# Patient Record
Sex: Male | Born: 1985 | Race: White | Hispanic: No | Marital: Married | Smoking: Former smoker
Health system: Southern US, Community
[De-identification: ages and names within clinical notes are randomized; demographics above are authoritative.]

## PROBLEM LIST (undated history)

## (undated) DIAGNOSIS — D649 Anemia, unspecified: Secondary | ICD-10-CM

## (undated) DIAGNOSIS — N3941 Urge incontinence: Secondary | ICD-10-CM

## (undated) DIAGNOSIS — N319 Neuromuscular dysfunction of bladder, unspecified: Secondary | ICD-10-CM

## (undated) DIAGNOSIS — N318 Other neuromuscular dysfunction of bladder: Secondary | ICD-10-CM

## (undated) DIAGNOSIS — G8220 Paraplegia, unspecified: Secondary | ICD-10-CM

## (undated) DIAGNOSIS — J302 Other seasonal allergic rhinitis: Secondary | ICD-10-CM

## (undated) DIAGNOSIS — Z789 Other specified health status: Secondary | ICD-10-CM

## (undated) DIAGNOSIS — S24103A Unspecified injury at T7-T10 level of thoracic spinal cord, initial encounter: Secondary | ICD-10-CM

## (undated) DIAGNOSIS — Z9989 Dependence on other enabling machines and devices: Secondary | ICD-10-CM

## (undated) DIAGNOSIS — Z86718 Personal history of other venous thrombosis and embolism: Secondary | ICD-10-CM

## (undated) DIAGNOSIS — Z993 Dependence on wheelchair: Secondary | ICD-10-CM

## (undated) DIAGNOSIS — G4733 Obstructive sleep apnea (adult) (pediatric): Secondary | ICD-10-CM

## (undated) DIAGNOSIS — K219 Gastro-esophageal reflux disease without esophagitis: Secondary | ICD-10-CM

## (undated) DIAGNOSIS — F32A Depression, unspecified: Secondary | ICD-10-CM

## (undated) DIAGNOSIS — I10 Essential (primary) hypertension: Secondary | ICD-10-CM

## (undated) DIAGNOSIS — L97929 Non-pressure chronic ulcer of unspecified part of left lower leg with unspecified severity: Secondary | ICD-10-CM

## (undated) HISTORY — PX: TONSILLECTOMY: SUR1361

## (undated) HISTORY — DX: Anemia, unspecified: D64.9

## (undated) HISTORY — DX: Essential (primary) hypertension: I10

## (undated) HISTORY — DX: Depression, unspecified: F32.A

---
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
## 2011-07-19 ENCOUNTER — Ambulatory Visit (HOSPITAL_BASED_OUTPATIENT_CLINIC_OR_DEPARTMENT_OTHER): Payer: Managed Care, Other (non HMO) | Attending: Family Medicine

## 2011-07-19 DIAGNOSIS — G4733 Obstructive sleep apnea (adult) (pediatric): Secondary | ICD-10-CM | POA: Insufficient documentation

## 2011-07-24 DIAGNOSIS — G4733 Obstructive sleep apnea (adult) (pediatric): Secondary | ICD-10-CM

## 2011-07-24 NOTE — Procedures (Signed)
NAME:  [name], [name]                  ACCOUNT NO.:  [id number]  MEDICAL RECORD NO.:  [id number]          PATIENT TYPE:  OUT  LOCATION:  SLEEP CENTER                 FACILITY:  [facility]  PHYSICIAN:  Clinton D. Maple Hudson, MD, FCCP, FACPDATE OF BIRTH:  07/25/1988  DATE OF STUDY:  07/19/2011                           NOCTURNAL POLYSOMNOGRAM  REFERRING PHYSICIAN:  Elease Hashimoto A. Benedetto Goad, M.D.  INDICATION FOR STUDY:  Hypersomnia with sleep apnea.  EPWORTH SLEEPINESS SCORE:  11/24.  BMI 33.  Weight 230 pounds.  Height 70 inches.  Neck 17 inches.  MEDICATIONS:  Home medications are charted and reviewed.  SLEEP ARCHITECTURE:  Split study protocol.  During the diagnostic phase total sleep time 123 minutes with sleep efficiency 93.9%.  Stage I was 2.8%, stage II 93.9%, stage III absent, REM 3.3% of total sleep time. Sleep latency 6.5 minutes, REM latency 115.5 minutes, awake after sleep onset 1.5 minutes, arousal index 8.3.  BEDTIME MEDICATION:  Diazepam, baclofen, oxybutynin ER, imipramine, citalopram, omeprazole, Allegra.  RESPIRATORY DATA:  Split study protocol.  Apnea-hypopnea index (AHI) 24.4 per hour.  A total of 50 events was scored including 11 obstructive apneas and 39 hypopneas.  Events were associated with non-supine sleep. REM AHI 45 per hour.  CPAP was then titrated to 17 CWP, AHI 1.1 per hour.  He wore a large ResMed Quattro FX full-face mask with heated humidifier.  OXYGEN DATA:  Moderate snoring before CPAP with oxygen desaturation to a nadir of 85% on room air.  With CPAP titration mean oxygen saturation held 94.6% on room air and snoring was prevented.  CARDIAC DATA:  Normal sinus rhythm.  MOVEMENT-PARASOMNIA:  No significant movement disturbance.  No bathroom trips.  IMPRESSIONS-RECOMMENDATIONS: 1. Moderate obstructive sleep apnea/hypopnea syndrome, AHI 24.4 per     hour.  Non-supine events with moderate snoring and oxygen     desaturation to a nadir of 85% on room air. 2.  Successful CPAP titration to 17 CWP, AHI 1.1 per hour.  He wore a     large ResMed Quattro FX full-face mask with heated humidifier.     Clinton D. Maple Hudson, MD, [facility], FACP Diplomate, Biomedical engineer of Sleep Medicine Electronically Signed    CDY/MEDQ  D:  07/24/2011 16:10:96  T:  07/24/2011 10:00:48  Job:  [id number]

## 2011-10-03 ENCOUNTER — Emergency Department (HOSPITAL_COMMUNITY)
Admission: EM | Admit: 2011-10-03 | Discharge: 2011-10-03 | Disposition: A | Discharge status: Home / Self Care | Payer: PRIVATE HEALTH INSURANCE | Attending: Emergency Medicine | Admitting: Emergency Medicine

## 2011-10-03 ENCOUNTER — Encounter: Payer: Self-pay | Admitting: Emergency Medicine

## 2011-10-03 ENCOUNTER — Emergency Department (HOSPITAL_COMMUNITY): Payer: PRIVATE HEALTH INSURANCE

## 2011-10-03 DIAGNOSIS — Z79899 Other long term (current) drug therapy: Secondary | ICD-10-CM | POA: Insufficient documentation

## 2011-10-03 DIAGNOSIS — S82209A Unspecified fracture of shaft of unspecified tibia, initial encounter for closed fracture: Secondary | ICD-10-CM | POA: Insufficient documentation

## 2011-10-03 DIAGNOSIS — S82201A Unspecified fracture of shaft of right tibia, initial encounter for closed fracture: Secondary | ICD-10-CM

## 2011-10-03 DIAGNOSIS — M25569 Pain in unspecified knee: Secondary | ICD-10-CM | POA: Insufficient documentation

## 2011-10-03 DIAGNOSIS — S82401A Unspecified fracture of shaft of right fibula, initial encounter for closed fracture: Secondary | ICD-10-CM

## 2011-10-03 DIAGNOSIS — S82409A Unspecified fracture of shaft of unspecified fibula, initial encounter for closed fracture: Secondary | ICD-10-CM | POA: Insufficient documentation

## 2011-10-03 DIAGNOSIS — W050XXA Fall from non-moving wheelchair, initial encounter: Secondary | ICD-10-CM | POA: Insufficient documentation

## 2011-10-03 DIAGNOSIS — G8220 Paraplegia, unspecified: Secondary | ICD-10-CM | POA: Insufficient documentation

## 2011-10-03 DIAGNOSIS — Z86718 Personal history of other venous thrombosis and embolism: Secondary | ICD-10-CM | POA: Insufficient documentation

## 2011-10-03 DIAGNOSIS — M25579 Pain in unspecified ankle and joints of unspecified foot: Secondary | ICD-10-CM | POA: Insufficient documentation

## 2011-10-03 MED ORDER — METHOCARBAMOL 500 MG PO TABS: 750.0000 mg | ORAL_TABLET | Freq: Two times a day (BID) | ORAL | Status: AC
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
## 2011-10-03 NOTE — ED Provider Notes (Signed)
Medical screening examination/treatment/procedure(s) were performed by non-physician practitioner and as supervising physician I was immediately available for consultation/collaboration.  Juliet Rude. Rubin Payor, MD 10/03/11 1630

## 2011-10-03 NOTE — Progress Notes (Signed)
Orthopedic Tech Progress Note Patient Details:  [name] 1986/02/12 [id number] Type of Splint: Post Splint Location: splint to right leg Splint Interventions:  (applied)   short Post splint to right leg  [name] 10/03/2011, 3:52 PM
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
## 2012-02-15 ENCOUNTER — Encounter (HOSPITAL_BASED_OUTPATIENT_CLINIC_OR_DEPARTMENT_OTHER): Payer: PRIVATE HEALTH INSURANCE | Attending: General Surgery

## 2012-02-15 DIAGNOSIS — G8220 Paraplegia, unspecified: Secondary | ICD-10-CM | POA: Insufficient documentation

## 2012-02-15 DIAGNOSIS — L89899 Pressure ulcer of other site, unspecified stage: Secondary | ICD-10-CM | POA: Insufficient documentation

## 2012-02-15 DIAGNOSIS — L989 Disorder of the skin and subcutaneous tissue, unspecified: Secondary | ICD-10-CM | POA: Insufficient documentation

## 2012-02-15 DIAGNOSIS — L8990 Pressure ulcer of unspecified site, unspecified stage: Secondary | ICD-10-CM | POA: Insufficient documentation

## 2012-02-15 DIAGNOSIS — Z79899 Other long term (current) drug therapy: Secondary | ICD-10-CM | POA: Insufficient documentation

## 2012-02-15 DIAGNOSIS — Z86718 Personal history of other venous thrombosis and embolism: Secondary | ICD-10-CM | POA: Insufficient documentation
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
## 2012-02-22 ENCOUNTER — Encounter (HOSPITAL_BASED_OUTPATIENT_CLINIC_OR_DEPARTMENT_OTHER): Payer: PRIVATE HEALTH INSURANCE | Attending: General Surgery

## 2012-02-22 DIAGNOSIS — Z79899 Other long term (current) drug therapy: Secondary | ICD-10-CM | POA: Insufficient documentation

## 2012-02-22 DIAGNOSIS — L989 Disorder of the skin and subcutaneous tissue, unspecified: Secondary | ICD-10-CM | POA: Insufficient documentation

## 2012-02-22 DIAGNOSIS — L89899 Pressure ulcer of other site, unspecified stage: Secondary | ICD-10-CM | POA: Insufficient documentation

## 2012-02-22 DIAGNOSIS — L8990 Pressure ulcer of unspecified site, unspecified stage: Secondary | ICD-10-CM | POA: Insufficient documentation

## 2012-03-14 ENCOUNTER — Encounter (HOSPITAL_BASED_OUTPATIENT_CLINIC_OR_DEPARTMENT_OTHER): Payer: PRIVATE HEALTH INSURANCE

## 2012-03-28 ENCOUNTER — Encounter (HOSPITAL_BASED_OUTPATIENT_CLINIC_OR_DEPARTMENT_OTHER): Payer: PRIVATE HEALTH INSURANCE | Attending: General Surgery

## 2012-03-28 DIAGNOSIS — L8990 Pressure ulcer of unspecified site, unspecified stage: Secondary | ICD-10-CM | POA: Insufficient documentation

## 2012-03-28 DIAGNOSIS — L02419 Cutaneous abscess of limb, unspecified: Secondary | ICD-10-CM | POA: Insufficient documentation

## 2012-03-28 DIAGNOSIS — L89899 Pressure ulcer of other site, unspecified stage: Secondary | ICD-10-CM | POA: Insufficient documentation

## 2012-03-28 DIAGNOSIS — L97509 Non-pressure chronic ulcer of other part of unspecified foot with unspecified severity: Secondary | ICD-10-CM | POA: Insufficient documentation

## 2012-03-28 DIAGNOSIS — L03119 Cellulitis of unspecified part of limb: Secondary | ICD-10-CM | POA: Insufficient documentation

## 2012-03-28 DIAGNOSIS — L03039 Cellulitis of unspecified toe: Secondary | ICD-10-CM | POA: Insufficient documentation

## 2012-03-28 DIAGNOSIS — L02619 Cutaneous abscess of unspecified foot: Secondary | ICD-10-CM | POA: Insufficient documentation

## 2012-04-18 ENCOUNTER — Ambulatory Visit (HOSPITAL_COMMUNITY)
Admission: RE | Admit: 2012-04-18 | Discharge: 2012-04-18 | Disposition: A | Discharge status: Home / Self Care | Payer: PRIVATE HEALTH INSURANCE | Source: Ambulatory Visit | Attending: General Surgery | Admitting: General Surgery

## 2012-04-18 ENCOUNTER — Other Ambulatory Visit (HOSPITAL_BASED_OUTPATIENT_CLINIC_OR_DEPARTMENT_OTHER): Payer: Self-pay | Admitting: General Surgery

## 2012-04-18 DIAGNOSIS — M79673 Pain in unspecified foot: Secondary | ICD-10-CM

## 2012-04-18 DIAGNOSIS — M79609 Pain in unspecified limb: Secondary | ICD-10-CM | POA: Insufficient documentation

## 2012-04-18 DIAGNOSIS — M7989 Other specified soft tissue disorders: Secondary | ICD-10-CM | POA: Insufficient documentation

## 2012-04-25 ENCOUNTER — Encounter (HOSPITAL_BASED_OUTPATIENT_CLINIC_OR_DEPARTMENT_OTHER): Payer: PRIVATE HEALTH INSURANCE | Attending: General Surgery

## 2012-04-25 DIAGNOSIS — L02419 Cutaneous abscess of limb, unspecified: Secondary | ICD-10-CM | POA: Insufficient documentation

## 2012-04-25 DIAGNOSIS — L02619 Cutaneous abscess of unspecified foot: Secondary | ICD-10-CM | POA: Insufficient documentation

## 2012-04-25 DIAGNOSIS — L8990 Pressure ulcer of unspecified site, unspecified stage: Secondary | ICD-10-CM | POA: Insufficient documentation

## 2012-04-25 DIAGNOSIS — L89899 Pressure ulcer of other site, unspecified stage: Secondary | ICD-10-CM | POA: Insufficient documentation

## 2012-04-25 DIAGNOSIS — L03039 Cellulitis of unspecified toe: Secondary | ICD-10-CM | POA: Insufficient documentation

## 2012-04-25 DIAGNOSIS — L97509 Non-pressure chronic ulcer of other part of unspecified foot with unspecified severity: Secondary | ICD-10-CM | POA: Insufficient documentation

## 2012-04-25 DIAGNOSIS — L03119 Cellulitis of unspecified part of limb: Secondary | ICD-10-CM | POA: Insufficient documentation

## 2012-04-26 ENCOUNTER — Telehealth: Payer: Self-pay | Admitting: *Deleted

## 2012-04-26 NOTE — Telephone Encounter (Signed)
Pt reminded of appt.  Given directions to the Center.

## 2012-04-27 ENCOUNTER — Ambulatory Visit: Payer: PRIVATE HEALTH INSURANCE | Admitting: Internal Medicine

## 2012-04-27 ENCOUNTER — Encounter: Payer: Self-pay | Admitting: *Deleted

## 2012-04-27 DIAGNOSIS — L89899 Pressure ulcer of other site, unspecified stage: Secondary | ICD-10-CM | POA: Insufficient documentation

## 2012-05-01 DIAGNOSIS — Z86718 Personal history of other venous thrombosis and embolism: Secondary | ICD-10-CM | POA: Insufficient documentation

## 2012-05-01 DIAGNOSIS — Z9081 Acquired absence of spleen: Secondary | ICD-10-CM | POA: Insufficient documentation

## 2012-05-01 DIAGNOSIS — S8291XA Unspecified fracture of right lower leg, initial encounter for closed fracture: Secondary | ICD-10-CM | POA: Insufficient documentation

## 2012-05-01 DIAGNOSIS — G8220 Paraplegia, unspecified: Secondary | ICD-10-CM | POA: Insufficient documentation

## 2012-05-02 ENCOUNTER — Ambulatory Visit (INDEPENDENT_AMBULATORY_CARE_PROVIDER_SITE_OTHER): Payer: PRIVATE HEALTH INSURANCE | Admitting: Internal Medicine

## 2012-05-02 ENCOUNTER — Encounter: Payer: Self-pay | Admitting: Internal Medicine

## 2012-05-02 VITALS — BP 126/86 | HR 102 | Temp 98.8°F

## 2012-05-02 DIAGNOSIS — Z9081 Acquired absence of spleen: Secondary | ICD-10-CM

## 2012-05-02 DIAGNOSIS — S82899A Other fracture of unspecified lower leg, initial encounter for closed fracture: Secondary | ICD-10-CM

## 2012-05-02 DIAGNOSIS — Z9089 Acquired absence of other organs: Secondary | ICD-10-CM

## 2012-05-02 DIAGNOSIS — L89899 Pressure ulcer of other site, unspecified stage: Secondary | ICD-10-CM

## 2012-05-02 DIAGNOSIS — G8220 Paraplegia, unspecified: Secondary | ICD-10-CM

## 2012-05-02 DIAGNOSIS — IMO0002 Reserved for concepts with insufficient information to code with codable children: Secondary | ICD-10-CM

## 2012-05-02 DIAGNOSIS — S8291XA Unspecified fracture of right lower leg, initial encounter for closed fracture: Secondary | ICD-10-CM

## 2012-05-02 DIAGNOSIS — L8990 Pressure ulcer of unspecified site, unspecified stage: Secondary | ICD-10-CM

## 2012-05-02 DIAGNOSIS — Z86718 Personal history of other venous thrombosis and embolism: Secondary | ICD-10-CM

## 2012-05-02 NOTE — Progress Notes (Addendum)
Patient ID: [name], male   DOB: [birth year], 26 y.o.   MRN: [id number]     [facility] for Infectious Disease  Patient Active Problem List  Diagnoses  . Decubitus ulcer of left leg  . Paraplegia following spinal cord injury  . History of DVT (deep vein thrombosis)  . History of splenectomy  . Leg fracture, right    Patient's Medications  New Prescriptions   No medications on file  Previous Medications   BACLOFEN (LIORESAL) 10 MG TABLET    Take 10 mg by mouth 3 (three) times daily.     CITALOPRAM (CELEXA) 40 MG TABLET    Take 40 mg by mouth at bedtime.    DIAZEPAM (VALIUM) 10 MG TABLET    Take 10 mg by mouth at bedtime.    DOXYCYCLINE (DORYX) 100 MG DR CAPSULE    Take 100 mg by mouth 2 (two) times daily. He is taking for 10 days for UTI. He started on 09/29/11 and has not completed.   DOXYCYCLINE (VIBRA-TABS) 100 MG TABLET    Take 100 mg by mouth 2 (two) times daily. He is taking for 10 days for a UTI. He started on 09/29/11 and has not completed.     FEXOFENADINE (ALLEGRA) 180 MG TABLET    Take 180 mg by mouth at bedtime.     IMIPRAMINE (TOFRANIL) 50 MG TABLET    Take 50 mg by mouth at bedtime.     MULTIPLE VITAMINS-MINERALS (MULTIVITAMINS THER. W/MINERALS) TABS    Take 1 tablet by mouth at bedtime.     OMEPRAZOLE (PRILOSEC) 40 MG CAPSULE    Take 40 mg by mouth at bedtime.    OXYBUTYNIN (DITROPAN XL) 15 MG 24 HR TABLET    Take 30 mg by mouth at bedtime.   Modified Medications   No medications on file  Discontinued Medications   No medications on file    Subjective: [name] is a 26 year old with paraplegia who is referred to me by Dr. Joanne Gavel for evaluation of chronic wounds on his left lateral leg and left foot between his first and second toes. He states that he's had both wounds for at least 3 years. He believes that the wounds on his left lateral leg began after his leg while on the side of his wheelchair. He is not sure what started the wounds on his foot. He had his  left great toenail removed several years ago. He has been followed at the wound Center in [place] and then more recently hearing [place]. He is also been seen by Dr. Nada Libman, a local dermatologist.  He states that he has had multiple topical therapies over the last several years. He states that occasionally some of these will lead to transient improvement but the wounds have never healed completely. He has been on IV vancomycin for topical MRSA infections in the past but that also never lead to complete healing. He has not been on vancomycin for over one year. He recently had a swab culture done of his left lateral leg wound and it grew MRSA. He was started on oral doxycycline May 17 but states that there has been no change in the left lateral leg wound and that his left foot wound has actually gotten a little bit larger with some increased yellow green drainage. He does not have any sensation in his legs. He has not had any fever, chills or sweats.  Objective: Temp: 98.8 F (37.1 C) (06/11 1039) Temp  src: Oral (06/11 1039) BP: 126/86 mmHg (06/11 1039) Pulse Rate: 102  (06/11 1039)  General: Has a somewhat flat affect. He is in no distress sitting in his wheelchair. Skin: No rash Lungs: Clear Cor: Regular S1-S2 no murmurs Left leg: He has 2 large superficial ulcers on his left lower leg laterally. There is some yellow drainage on the gauze dressing. There are areas of dark eschar scattered over the wounds. He has large we've been ulcer or between his left first and second toes extending up onto the dorsum of his foot with yellow exudate adherent across the wound. He has no surrounding cellulitis or increased warmth.   Assessment: From the configuration of his wheelchair I suspect that he does have some ongoing pressure her affecting nonhealing of his wounds on the left lateral leg. However I cannot explain the chronic nature of both the wounds on his left lateral leg and left foot. He  describes worsening of the left foot wound since starting doxycycline. I will send a swab specimen from his left foot wound today for repeat culture to see if he is colonized with other pathogens that might help graft a different antibiotic regimen.  Plan: 1. Continue doxycycline for now 2. Swab left foot wound for Gram stain and culture 3. Followup in 2 weeks   Cliffton Asters, MD [facility] for Infectious Disease [facility] [phone] pager   [phone] cell 05/02/2012, 11:09 AM   Addendum: The swab culture from his left foot grew MSSA which will not be covered well by his doxycycline. He appears to be co-infected with MSSA and MRSA so I will add cephalexin.  Cliffton Asters, MD [facility] for Infectious Disease [facility] [phone] pager   [phone] cell 05/05/2012, 2:58 PM

## 2012-05-05 LAB — WOUND CULTURE
Gram Stain: NONE SEEN
Gram Stain: NONE SEEN

## 2012-05-05 MED ORDER — CEPHALEXIN 500 MG PO CAPS: 500.0000 mg | ORAL_CAPSULE | Freq: Four times a day (QID) | ORAL | Status: DC

## 2012-05-05 NOTE — Addendum Note (Signed)
Addended by: Cliffton Asters on: 05/05/2012 03:03 PM   Modules accepted: Orders

## 2012-05-10 ENCOUNTER — Ambulatory Visit (INDEPENDENT_AMBULATORY_CARE_PROVIDER_SITE_OTHER): Payer: PRIVATE HEALTH INSURANCE | Admitting: *Deleted

## 2012-05-10 DIAGNOSIS — L97909 Non-pressure chronic ulcer of unspecified part of unspecified lower leg with unspecified severity: Secondary | ICD-10-CM

## 2012-05-16 ENCOUNTER — Encounter: Payer: Self-pay | Admitting: Internal Medicine

## 2012-05-16 ENCOUNTER — Ambulatory Visit (INDEPENDENT_AMBULATORY_CARE_PROVIDER_SITE_OTHER): Payer: PRIVATE HEALTH INSURANCE | Admitting: Internal Medicine

## 2012-05-16 VITALS — BP 124/70 | HR 92 | Temp 98.7°F | Wt 258.0 lb

## 2012-05-16 DIAGNOSIS — L89899 Pressure ulcer of other site, unspecified stage: Secondary | ICD-10-CM

## 2012-05-16 DIAGNOSIS — L8990 Pressure ulcer of unspecified site, unspecified stage: Secondary | ICD-10-CM

## 2012-05-16 NOTE — Progress Notes (Signed)
Patient ID: [name], male   DOB: 06/23/1986, 26 y.o.   MRN: [id number]    [facility] for Infectious Disease  Patient Active Problem List  Diagnosis  . Decubitus ulcer of left leg  . Paraplegia following spinal cord injury  . History of DVT (deep vein thrombosis)  . History of splenectomy  . Leg fracture, right    Patient's Medications  New Prescriptions   No medications on file  Previous Medications   BACLOFEN (LIORESAL) 10 MG TABLET    Take 10 mg by mouth 3 (three) times daily.     CITALOPRAM (CELEXA) 40 MG TABLET    Take 40 mg by mouth at bedtime.    DIAZEPAM (VALIUM) 10 MG TABLET    Take 10 mg by mouth at bedtime.    FEXOFENADINE (ALLEGRA) 180 MG TABLET    Take 180 mg by mouth at bedtime.     IMIPRAMINE (TOFRANIL) 50 MG TABLET    Take 50 mg by mouth at bedtime.     MULTIPLE VITAMINS-MINERALS (MULTIVITAMINS THER. W/MINERALS) TABS    Take 1 tablet by mouth at bedtime.     OMEPRAZOLE (PRILOSEC) 40 MG CAPSULE    Take 40 mg by mouth at bedtime.    OXYBUTYNIN (DITROPAN XL) 15 MG 24 HR TABLET    Take 30 mg by mouth at bedtime.   Modified Medications   No medications on file  Discontinued Medications   CEPHALEXIN (KEFLEX) 500 MG CAPSULE    Take 1 capsule (500 mg total) by mouth 4 (four) times daily.   DOXYCYCLINE (DORYX) 100 MG DR CAPSULE    Take 100 mg by mouth 2 (two) times daily. He is taking for 10 days for UTI. He started on 09/29/11 and has not completed.   DOXYCYCLINE (VIBRA-TABS) 100 MG TABLET    Take 100 mg by mouth 2 (two) times daily. He is taking for 10 days for a UTI. He started on 09/29/11 and has not completed.      Subjective: Mr. Chewning is in for his followup visit. He has now been on doxycycline since May 17. He just completed his course of cephalexin. He just left the wound center where they change the dressing. He states that he was told that the wound was less red. He states that he does not have any of the symptoms that he typically has had in past years when  he had active infection. Specifically, he has not had any fever, chills or sweats.  Objective: Temp: 98.7 F (37.1 C) (06/25 1445) Temp src: Oral (06/25 1445) BP: 124/70 mmHg (06/25 1445) Pulse Rate: 92  (06/25 1445)  General: He is comfortable sitting in his wheelchair Skin: His left leg is newly dressed and he does not desire to have the dressing changed  Assessment: His most recent wound culture her revealed MSSA resistant to doxycycline. I doubt that there is much utility to continuing doxycycline beyond this point.  Plan: 1. Discontinue doxycycline and observe off of antibiotics 2. Continue wound care 3. He is to call me if he has any signs of recurrent infection 4. Followup here in 6 weeks   Cliffton Asters, MD [facility] for Infectious Disease [facility] [phone] pager   [phone] cell 05/16/2012, 2:57 PM

## 2012-05-23 ENCOUNTER — Encounter (HOSPITAL_BASED_OUTPATIENT_CLINIC_OR_DEPARTMENT_OTHER): Payer: PRIVATE HEALTH INSURANCE | Attending: General Surgery

## 2012-05-23 DIAGNOSIS — G8220 Paraplegia, unspecified: Secondary | ICD-10-CM | POA: Insufficient documentation

## 2012-05-23 DIAGNOSIS — Z86718 Personal history of other venous thrombosis and embolism: Secondary | ICD-10-CM | POA: Insufficient documentation

## 2012-05-23 DIAGNOSIS — Z993 Dependence on wheelchair: Secondary | ICD-10-CM | POA: Insufficient documentation

## 2012-05-23 DIAGNOSIS — L97809 Non-pressure chronic ulcer of other part of unspecified lower leg with unspecified severity: Secondary | ICD-10-CM | POA: Insufficient documentation

## 2012-05-23 DIAGNOSIS — G4733 Obstructive sleep apnea (adult) (pediatric): Secondary | ICD-10-CM | POA: Insufficient documentation

## 2012-05-23 DIAGNOSIS — K219 Gastro-esophageal reflux disease without esophagitis: Secondary | ICD-10-CM | POA: Insufficient documentation

## 2012-05-23 DIAGNOSIS — L97509 Non-pressure chronic ulcer of other part of unspecified foot with unspecified severity: Secondary | ICD-10-CM | POA: Insufficient documentation

## 2012-05-23 DIAGNOSIS — IMO0002 Reserved for concepts with insufficient information to code with codable children: Secondary | ICD-10-CM | POA: Insufficient documentation

## 2012-05-23 DIAGNOSIS — Z79899 Other long term (current) drug therapy: Secondary | ICD-10-CM | POA: Insufficient documentation
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
## 2012-06-09 ENCOUNTER — Other Ambulatory Visit: Payer: Self-pay | Admitting: Plastic Surgery

## 2012-06-09 ENCOUNTER — Encounter (HOSPITAL_BASED_OUTPATIENT_CLINIC_OR_DEPARTMENT_OTHER): Payer: Self-pay | Admitting: *Deleted
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
## 2012-06-14 ENCOUNTER — Ambulatory Visit (HOSPITAL_BASED_OUTPATIENT_CLINIC_OR_DEPARTMENT_OTHER)
Admission: RE | Admit: 2012-06-14 | Discharge: 2012-06-14 | Disposition: A | Discharge status: Home / Self Care | Payer: PRIVATE HEALTH INSURANCE | Source: Ambulatory Visit | Attending: Plastic Surgery | Admitting: Plastic Surgery

## 2012-06-14 ENCOUNTER — Encounter (HOSPITAL_BASED_OUTPATIENT_CLINIC_OR_DEPARTMENT_OTHER): Payer: Self-pay | Admitting: Anesthesiology

## 2012-06-14 ENCOUNTER — Ambulatory Visit (HOSPITAL_BASED_OUTPATIENT_CLINIC_OR_DEPARTMENT_OTHER): Payer: PRIVATE HEALTH INSURANCE | Admitting: Anesthesiology

## 2012-06-14 ENCOUNTER — Encounter (HOSPITAL_BASED_OUTPATIENT_CLINIC_OR_DEPARTMENT_OTHER)
Admission: RE | Disposition: A | Discharge status: Home / Self Care | Payer: Self-pay | Source: Ambulatory Visit | Attending: Plastic Surgery

## 2012-06-14 ENCOUNTER — Encounter (HOSPITAL_BASED_OUTPATIENT_CLINIC_OR_DEPARTMENT_OTHER): Payer: Self-pay | Admitting: *Deleted

## 2012-06-14 DIAGNOSIS — L97509 Non-pressure chronic ulcer of other part of unspecified foot with unspecified severity: Secondary | ICD-10-CM | POA: Insufficient documentation

## 2012-06-14 DIAGNOSIS — K219 Gastro-esophageal reflux disease without esophagitis: Secondary | ICD-10-CM | POA: Insufficient documentation

## 2012-06-14 DIAGNOSIS — G8220 Paraplegia, unspecified: Secondary | ICD-10-CM | POA: Insufficient documentation

## 2012-06-14 DIAGNOSIS — N319 Neuromuscular dysfunction of bladder, unspecified: Secondary | ICD-10-CM | POA: Insufficient documentation

## 2012-06-14 DIAGNOSIS — L97809 Non-pressure chronic ulcer of other part of unspecified lower leg with unspecified severity: Secondary | ICD-10-CM | POA: Insufficient documentation

## 2012-06-14 DIAGNOSIS — Z79899 Other long term (current) drug therapy: Secondary | ICD-10-CM | POA: Insufficient documentation

## 2012-06-14 DIAGNOSIS — Z86718 Personal history of other venous thrombosis and embolism: Secondary | ICD-10-CM | POA: Insufficient documentation

## 2012-06-14 DIAGNOSIS — G4733 Obstructive sleep apnea (adult) (pediatric): Secondary | ICD-10-CM | POA: Insufficient documentation

## 2012-06-14 HISTORY — DX: Obstructive sleep apnea (adult) (pediatric): G47.33

## 2012-06-14 HISTORY — DX: Obstructive sleep apnea (adult) (pediatric): Z99.89

## 2012-06-14 HISTORY — DX: Other neuromuscular dysfunction of bladder: N31.8

## 2012-06-14 HISTORY — DX: Other seasonal allergic rhinitis: J30.2

## 2012-06-14 HISTORY — DX: Personal history of other venous thrombosis and embolism: Z86.718

## 2012-06-14 HISTORY — DX: Dependence on wheelchair: Z99.3

## 2012-06-14 HISTORY — DX: Gastro-esophageal reflux disease without esophagitis: K21.9

## 2012-06-14 HISTORY — DX: Unspecified injury at T7-t10 level of thoracic spinal cord, initial encounter: S24.103A

## 2012-06-14 HISTORY — DX: Non-pressure chronic ulcer of unspecified part of left lower leg with unspecified severity: L97.929

## 2012-06-14 HISTORY — DX: Urge incontinence: N39.41

## 2012-06-14 HISTORY — DX: Neuromuscular dysfunction of bladder, unspecified: N31.9

## 2012-06-14 HISTORY — DX: Other specified health status: Z78.9

## 2012-06-14 HISTORY — DX: Paraplegia, unspecified: G82.20

## 2012-06-14 HISTORY — PX: I & D EXTREMITY: SHX5045

## 2012-06-14 SURGERY — IRRIGATION AND DEBRIDEMENT EXTREMITY
Anesthesia: General | Site: Leg Lower | Laterality: Left | Wound class: Dirty or Infected

## 2012-06-14 MED ORDER — LIDOCAINE HCL (CARDIAC) 20 MG/ML IV SOLN
INTRAVENOUS | Status: DC | PRN
  Administered 2012-06-14: 50 mg via INTRAVENOUS

## 2012-06-14 MED ORDER — LACTATED RINGERS IV SOLN
INTRAVENOUS | Status: DC
  Administered 2012-06-14: 10:00:00 via INTRAVENOUS

## 2012-06-14 MED ORDER — VANCOMYCIN HCL IN DEXTROSE 1-5 GM/200ML-% IV SOLN
1000.0000 mg | INTRAVENOUS | Status: AC
  Administered 2012-06-14: 1000 mg via INTRAVENOUS

## 2012-06-14 MED ORDER — ONDANSETRON HCL 4 MG/2ML IJ SOLN
INTRAMUSCULAR | Status: DC | PRN
  Administered 2012-06-14: 4 mg via INTRAVENOUS

## 2012-06-14 MED ORDER — PROPOFOL 10 MG/ML IV EMUL
INTRAVENOUS | Status: DC | PRN
  Administered 2012-06-14: 200 mg via INTRAVENOUS

## 2012-06-14 MED ORDER — MIDAZOLAM HCL 5 MG/5ML IJ SOLN
INTRAMUSCULAR | Status: DC | PRN
  Administered 2012-06-14: 2 mg via INTRAVENOUS

## 2012-06-14 MED ORDER — LACTATED RINGERS IV SOLN: INTRAVENOUS | Status: DC

## 2012-06-14 MED ORDER — PROMETHAZINE HCL 25 MG/ML IJ SOLN: 6.2500 mg | INTRAMUSCULAR | Status: DC | PRN

## 2012-06-14 MED ORDER — MEPERIDINE HCL 25 MG/ML IJ SOLN: 6.2500 mg | INTRAMUSCULAR | Status: DC | PRN

## 2012-06-14 MED ORDER — DEXAMETHASONE SODIUM PHOSPHATE 4 MG/ML IJ SOLN
INTRAMUSCULAR | Status: DC | PRN
  Administered 2012-06-14: 8 mg via INTRAVENOUS

## 2012-06-14 MED ORDER — FENTANYL CITRATE 0.05 MG/ML IJ SOLN: 25.0000 ug | INTRAMUSCULAR | Status: DC | PRN

## 2012-06-14 MED ORDER — FENTANYL CITRATE 0.05 MG/ML IJ SOLN
INTRAMUSCULAR | Status: DC | PRN
  Administered 2012-06-14 (×2): 50 ug via INTRAVENOUS

## 2012-06-14 MED ORDER — SODIUM CHLORIDE 0.9 % IR SOLN
Status: DC | PRN
  Administered 2012-06-14: 14:00:00

## 2012-06-14 SURGICAL SUPPLY — 93 items
APL SKNCLS STERI-STRIP NONHPOA (GAUZE/BANDAGES/DRESSINGS)
BAG DECANTER FOR FLEXI CONT (MISCELLANEOUS) IMPLANT
BANDAGE ELASTIC 3 VELCRO ST LF (GAUZE/BANDAGES/DRESSINGS) IMPLANT
BANDAGE ELASTIC 4 VELCRO ST LF (GAUZE/BANDAGES/DRESSINGS) ×1 IMPLANT
BANDAGE ELASTIC 6 VELCRO ST LF (GAUZE/BANDAGES/DRESSINGS) ×1 IMPLANT
BANDAGE GAUZE ELAST BULKY 4 IN (GAUZE/BANDAGES/DRESSINGS) ×1 IMPLANT
BENZOIN TINCTURE PRP APPL 2/3 (GAUZE/BANDAGES/DRESSINGS) IMPLANT
BLADE MINI RND TIP GREEN BEAV (BLADE) IMPLANT
BLADE SURG 10 STRL SS (BLADE) IMPLANT
BLADE SURG 15 STRL LF DISP TIS (BLADE) ×1 IMPLANT
BLADE SURG 15 STRL SS (BLADE) ×2
BNDG CMPR 9X4 STRL LF SNTH (GAUZE/BANDAGES/DRESSINGS)
BNDG COHESIVE 1X5 TAN STRL LF (GAUZE/BANDAGES/DRESSINGS) IMPLANT
BNDG COHESIVE 4X5 TAN STRL (GAUZE/BANDAGES/DRESSINGS) IMPLANT
BNDG ESMARK 4X9 LF (GAUZE/BANDAGES/DRESSINGS) IMPLANT
CANISTER SUCT LVC 12 LTR MEDI- (MISCELLANEOUS) IMPLANT
CANISTER SUCTION 1200CC (MISCELLANEOUS) ×1 IMPLANT
CANISTER SUCTION 2500CC (MISCELLANEOUS) IMPLANT
CHLORAPREP W/TINT 26ML (MISCELLANEOUS) IMPLANT
CLOTH BEACON ORANGE TIMEOUT ST (SAFETY) ×2 IMPLANT
CORDS BIPOLAR (ELECTRODE) IMPLANT
COVER MAYO STAND STRL (DRAPES) ×2 IMPLANT
COVER TABLE BACK 60X90 (DRAPES) ×2 IMPLANT
DECANTER SPIKE VIAL GLASS SM (MISCELLANEOUS) IMPLANT
DRAIN PENROSE 18X1/2 LTX STRL (DRAIN) ×1 IMPLANT
DRAPE EXTREMITY T 121X128X90 (DRAPE) ×1 IMPLANT
DRAPE INCISE IOBAN 66X45 STRL (DRAPES) ×1 IMPLANT
DRAPE LG THREE QUARTER DISP (DRAPES) IMPLANT
DRSG ADAPTIC 3X8 NADH LF (GAUZE/BANDAGES/DRESSINGS) ×1 IMPLANT
DRSG EMULSION OIL 3X3 NADH (GAUZE/BANDAGES/DRESSINGS) IMPLANT
DRSG PAD ABDOMINAL 8X10 ST (GAUZE/BANDAGES/DRESSINGS) IMPLANT
ELECT NDL BLADE 2-5/6 (NEEDLE) IMPLANT
ELECT NDL TIP 2.8 STRL (NEEDLE) IMPLANT
ELECT NEEDLE BLADE 2-5/6 (NEEDLE) IMPLANT
ELECT NEEDLE TIP 2.8 STRL (NEEDLE) IMPLANT
ELECT REM PT RETURN 9FT ADLT (ELECTROSURGICAL) ×2
ELECTRODE REM PT RTRN 9FT ADLT (ELECTROSURGICAL) ×1 IMPLANT
GAUZE SPONGE 4X4 12PLY STRL LF (GAUZE/BANDAGES/DRESSINGS) IMPLANT
GAUZE XEROFORM 1X8 LF (GAUZE/BANDAGES/DRESSINGS) IMPLANT
GAUZE XEROFORM 5X9 LF (GAUZE/BANDAGES/DRESSINGS) IMPLANT
GLOVE BIO SURGEON STRL SZ 6.5 (GLOVE) ×3 IMPLANT
GOWN PREVENTION PLUS XLARGE (GOWN DISPOSABLE) ×1 IMPLANT
GOWN STRL NON-REIN LRG LVL3 (GOWN DISPOSABLE) ×4 IMPLANT
HANDPIECE INTERPULSE COAX TIP (DISPOSABLE)
IV NS IRRIG 3000ML ARTHROMATIC (IV SOLUTION) IMPLANT
MATRIX SURGICAL PSM 10X15CM (Tissue) ×1 IMPLANT
MICROMATRIX 500MG (Tissue) ×2 IMPLANT
NDL HYPO 30GX1 BEV (NEEDLE) IMPLANT
NEEDLE 27GAX1X1/2 (NEEDLE) IMPLANT
NEEDLE HYPO 30GX1 BEV (NEEDLE) IMPLANT
NS IRRIG 1000ML POUR BTL (IV SOLUTION) ×2 IMPLANT
PACK BASIN DAY SURGERY FS (CUSTOM PROCEDURE TRAY) ×2 IMPLANT
PADDING CAST ABS 3INX4YD NS (CAST SUPPLIES)
PADDING CAST ABS 4INX4YD NS (CAST SUPPLIES)
PADDING CAST ABS COTTON 3X4 (CAST SUPPLIES) IMPLANT
PADDING CAST ABS COTTON 4X4 ST (CAST SUPPLIES) IMPLANT
PENCIL BUTTON HOLSTER BLD 10FT (ELECTRODE) ×1 IMPLANT
SET HNDPC FAN SPRY TIP SCT (DISPOSABLE) IMPLANT
SLEEVE SCD COMPRESS KNEE MED (MISCELLANEOUS) IMPLANT
SOLUTION PARTIC MCRMTRX 500MG (Tissue) IMPLANT
SPLINT PLASTER CAST XFAST 3X15 (CAST SUPPLIES) IMPLANT
SPLINT PLASTER XTRA FASTSET 3X (CAST SUPPLIES)
SPONGE GAUZE 4X4 12PLY (GAUZE/BANDAGES/DRESSINGS) ×2 IMPLANT
SPONGE LAP 18X18 X RAY DECT (DISPOSABLE) IMPLANT
SPONGE LAP 4X18 X RAY DECT (DISPOSABLE) ×2 IMPLANT
STAPLER VISISTAT 35W (STAPLE) ×1 IMPLANT
STOCKINETTE 4X48 STRL (DRAPES) IMPLANT
STOCKINETTE 6  STRL (DRAPES)
STOCKINETTE 6 STRL (DRAPES) ×1 IMPLANT
STOCKINETTE IMPERVIOUS LG (DRAPES) ×1 IMPLANT
STRIP CLOSURE SKIN 1/2X4 (GAUZE/BANDAGES/DRESSINGS) IMPLANT
SUCTION FRAZIER TIP 10 FR DISP (SUCTIONS) IMPLANT
SURGILUBE 2OZ TUBE FLIPTOP (MISCELLANEOUS) IMPLANT
SUT ETHILON 3 0 PS 1 (SUTURE) IMPLANT
SUT ETHILON 4 0 P 3 18 (SUTURE) IMPLANT
SUT ETHILON 5 0 PS 2 18 (SUTURE) IMPLANT
SUT PROLENE 3 0 PS 2 (SUTURE) IMPLANT
SUT SILK 3 0 PS 1 (SUTURE) IMPLANT
SUT VIC AB 3-0 FS2 27 (SUTURE) IMPLANT
SUT VIC AB 5-0 P-3 18X BRD (SUTURE) IMPLANT
SUT VIC AB 5-0 P3 18 (SUTURE)
SUT VIC AB 5-0 PS2 18 (SUTURE) ×2 IMPLANT
SYR BULB IRRIGATION 50ML (SYRINGE) IMPLANT
SYR CONTROL 10ML LL (SYRINGE) ×2 IMPLANT
TAPE HYPAFIX 6X30 (GAUZE/BANDAGES/DRESSINGS) IMPLANT
TIP FLEX 45CM EVICEL (HEMOSTASIS) IMPLANT
TIP RIGID 35CM EVICEL (HEMOSTASIS) IMPLANT
TOWEL OR 17X24 6PK STRL BLUE (TOWEL DISPOSABLE) ×2 IMPLANT
TRAY DSU PREP LF (CUSTOM PROCEDURE TRAY) ×1 IMPLANT
TUBE CONNECTING 12X1/4 (SUCTIONS) ×1 IMPLANT
UNDERPAD 30X30 INCONTINENT (UNDERPADS AND DIAPERS) ×2 IMPLANT
WATER STERILE IRR 1000ML POUR (IV SOLUTION) ×2 IMPLANT
YANKAUER SUCT BULB TIP NO VENT (SUCTIONS) ×1 IMPLANT

## 2012-06-14 NOTE — Anesthesia Postprocedure Evaluation (Signed)
Anesthesia Post Note  Patient: [name]  Procedure(s) Performed: Procedure(s) (LRB): IRRIGATION AND DEBRIDEMENT EXTREMITY (Left)  Anesthesia type: General  Patient location: PACU  Post pain: Pain level controlled  Post assessment: Post-op Vital signs reviewed  Last Vitals:  Filed Vitals:   06/14/12 1430  BP: 119/60  Pulse: 82  Temp:   Resp: 18    Post vital signs: Reviewed  Level of consciousness: sedated  Complications: No apparent anesthesia complications

## 2012-06-14 NOTE — Transfer of Care (Signed)
Immediate Anesthesia Transfer of Care Note  Patient: [name]  Procedure(s) Performed: Procedure(s) (LRB): IRRIGATION AND DEBRIDEMENT EXTREMITY (Left)  Patient Location: PACU  Anesthesia Type: General  Level of Consciousness: awake, oriented, sedated and patient cooperative  Airway & Oxygen Therapy: Patient Spontanous Breathing and Patient connected to face mask oxygen  Post-op Assessment: Report given to PACU RN and Post -op Vital signs reviewed and stable  Post vital signs: Reviewed and stable  Complications: No apparent anesthesia complications

## 2012-06-14 NOTE — Brief Op Note (Signed)
06/14/2012  2:06 PM  PATIENT:  Cedric Fishman  26 y.o. male  PRE-OPERATIVE DIAGNOSIS:  Leg and foot ulcer left  POST-OPERATIVE DIAGNOSIS:  Leg and foot ulcer left  PROCEDURE:  Procedure(s) (LRB): IRRIGATION AND DEBRIDEMENT EXTREMITY (Left foot and leg) with ACELL AND VAC PLACEMENT   SURGEON:  Surgeon(s) and Role:    * Brianda Beitler Sanger, DO - Primary  PHYSICIAN ASSISTANT:   ASSISTANTS: Harrie Foreman, LPN   ANESTHESIA:   general  EBL:  Total I/O In: 700 [I.V.:700] Out: -   BLOOD ADMINISTERED:none  DRAINS: none   LOCAL MEDICATIONS USED:  NONE  SPECIMEN:  No Specimen  DISPOSITION OF SPECIMEN:  N/A  COUNTS:  YES  TOURNIQUET:  * No tourniquets in log *  DICTATION: dictation  PLAN OF CARE: Discharge to home after PACU  PATIENT DISPOSITION:  PACU - hemodynamically stable.   Delay start of Pharmacological VTE agent (>24hrs) due to surgical blood loss or risk of bleeding: no

## 2012-06-14 NOTE — Anesthesia Procedure Notes (Signed)
Procedure Name: LMA Insertion Date/Time: 06/14/2012 1:13 PM Performed by: Renella Cunas D Pre-anesthesia Checklist: Patient identified, Emergency Drugs available, Suction available and Patient being monitored Patient Re-evaluated:Patient Re-evaluated prior to inductionOxygen Delivery Method: Circle System Utilized Preoxygenation: Pre-oxygenation with 100% oxygen Intubation Type: IV induction Ventilation: Mask ventilation without difficulty LMA: LMA inserted LMA Size: 4.0 Number of attempts: 1 Airway Equipment and Method: bite block Placement Confirmation: positive ETCO2 Tube secured with: Tape Dental Injury: Teeth and Oropharynx as per pre-operative assessment
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
## 2012-06-15 LAB — POCT HEMOGLOBIN-HEMACUE: Hemoglobin: 13.6 g/dL (ref 13.0–17.0)
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
## 2012-06-19 ENCOUNTER — Encounter (HOSPITAL_BASED_OUTPATIENT_CLINIC_OR_DEPARTMENT_OTHER): Payer: Self-pay | Admitting: Plastic Surgery

## 2012-06-20 NOTE — Progress Notes (Signed)
[facility]  NAME:  [name], [name]                  ACCOUNT NO.:  [id number]  MEDICAL RECORD NO.:  [id number]      DATE OF BIRTH:  09/22/1986  PHYSICIAN:  Wayland Denis, DO       VISIT DATE:  06/19/2012                                  OFFICE VISIT   The patient is a 26 year old male, who is here for followup after undergoing left lower extremity, irrigation and debridement of his foot and his leg.  He had ACell placed with the VAC.  It is looking pretty good.  The ACell is incorporating, but not completely yet so.  We will continue with the Adaptic, Hydrogel, and the VAC and have him follow up in 1 week.  He is encouraged to continue to eat high-protein foods, no smoking, and elevate his leg as much as possible.     Wayland Denis, DO     CS/MEDQ  D:  06/19/2012  T:  06/20/2012  Job:  [id number]

## 2012-06-26 ENCOUNTER — Inpatient Hospital Stay (HOSPITAL_COMMUNITY)
Admission: EM | Admit: 2012-06-26 | Discharge: 2012-06-29 | DRG: 593 | Disposition: A | Discharge status: Home / Self Care | Payer: PRIVATE HEALTH INSURANCE | Attending: Internal Medicine | Admitting: Internal Medicine

## 2012-06-26 ENCOUNTER — Encounter (HOSPITAL_COMMUNITY): Payer: Self-pay | Admitting: *Deleted

## 2012-06-26 ENCOUNTER — Encounter (HOSPITAL_BASED_OUTPATIENT_CLINIC_OR_DEPARTMENT_OTHER): Payer: PRIVATE HEALTH INSURANCE | Attending: General Surgery

## 2012-06-26 DIAGNOSIS — L0291 Cutaneous abscess, unspecified: Secondary | ICD-10-CM

## 2012-06-26 DIAGNOSIS — L0390 Cellulitis, unspecified: Secondary | ICD-10-CM

## 2012-06-26 DIAGNOSIS — L97809 Non-pressure chronic ulcer of other part of unspecified lower leg with unspecified severity: Secondary | ICD-10-CM | POA: Insufficient documentation

## 2012-06-26 DIAGNOSIS — Z792 Long term (current) use of antibiotics: Secondary | ICD-10-CM

## 2012-06-26 DIAGNOSIS — Z8614 Personal history of Methicillin resistant Staphylococcus aureus infection: Secondary | ICD-10-CM

## 2012-06-26 DIAGNOSIS — G4733 Obstructive sleep apnea (adult) (pediatric): Secondary | ICD-10-CM | POA: Diagnosis present

## 2012-06-26 DIAGNOSIS — L02419 Cutaneous abscess of limb, unspecified: Secondary | ICD-10-CM | POA: Diagnosis present

## 2012-06-26 DIAGNOSIS — L97509 Non-pressure chronic ulcer of other part of unspecified foot with unspecified severity: Secondary | ICD-10-CM | POA: Insufficient documentation

## 2012-06-26 DIAGNOSIS — L8990 Pressure ulcer of unspecified site, unspecified stage: Secondary | ICD-10-CM | POA: Diagnosis present

## 2012-06-26 DIAGNOSIS — J309 Allergic rhinitis, unspecified: Secondary | ICD-10-CM | POA: Diagnosis present

## 2012-06-26 DIAGNOSIS — L89899 Pressure ulcer of other site, unspecified stage: Principal | ICD-10-CM

## 2012-06-26 DIAGNOSIS — Z880 Allergy status to penicillin: Secondary | ICD-10-CM

## 2012-06-26 DIAGNOSIS — Z888 Allergy status to other drugs, medicaments and biological substances status: Secondary | ICD-10-CM

## 2012-06-26 DIAGNOSIS — S8291XA Unspecified fracture of right lower leg, initial encounter for closed fracture: Secondary | ICD-10-CM

## 2012-06-26 DIAGNOSIS — L089 Local infection of the skin and subcutaneous tissue, unspecified: Secondary | ICD-10-CM

## 2012-06-26 DIAGNOSIS — Z9081 Acquired absence of spleen: Secondary | ICD-10-CM

## 2012-06-26 DIAGNOSIS — N319 Neuromuscular dysfunction of bladder, unspecified: Secondary | ICD-10-CM | POA: Diagnosis present

## 2012-06-26 DIAGNOSIS — Z86718 Personal history of other venous thrombosis and embolism: Secondary | ICD-10-CM

## 2012-06-26 DIAGNOSIS — L03119 Cellulitis of unspecified part of limb: Secondary | ICD-10-CM | POA: Diagnosis present

## 2012-06-26 DIAGNOSIS — Z993 Dependence on wheelchair: Secondary | ICD-10-CM

## 2012-06-26 DIAGNOSIS — IMO0002 Reserved for concepts with insufficient information to code with codable children: Secondary | ICD-10-CM

## 2012-06-26 DIAGNOSIS — Z882 Allergy status to sulfonamides status: Secondary | ICD-10-CM

## 2012-06-26 DIAGNOSIS — G8220 Paraplegia, unspecified: Secondary | ICD-10-CM

## 2012-06-26 DIAGNOSIS — S24103A Unspecified injury at T7-T10 level of thoracic spinal cord, initial encounter: Secondary | ICD-10-CM

## 2012-06-26 DIAGNOSIS — D72829 Elevated white blood cell count, unspecified: Secondary | ICD-10-CM

## 2012-06-26 DIAGNOSIS — Z9089 Acquired absence of other organs: Secondary | ICD-10-CM

## 2012-06-26 DIAGNOSIS — F17200 Nicotine dependence, unspecified, uncomplicated: Secondary | ICD-10-CM | POA: Diagnosis present

## 2012-06-26 LAB — CBC WITH DIFFERENTIAL/PLATELET
Basophils Absolute: 0.1 10*3/uL (ref 0.0–0.1)
Basophils Relative: 0 % (ref 0–1)
Eosinophils Absolute: 0.5 10*3/uL (ref 0.0–0.7)
Eosinophils Relative: 3 % (ref 0–5)
HCT: 39.4 % (ref 39.0–52.0)
Hemoglobin: 13 g/dL (ref 13.0–17.0)
Lymphocytes Relative: 14 % (ref 12–46)
Lymphs Abs: 2.6 10*3/uL (ref 0.7–4.0)
MCH: 26.5 pg (ref 26.0–34.0)
MCHC: 33 g/dL (ref 30.0–36.0)
MCV: 80.4 fL (ref 78.0–100.0)
Monocytes Absolute: 2 10*3/uL — ABNORMAL HIGH (ref 0.1–1.0)
Monocytes Relative: 11 % (ref 3–12)
Neutro Abs: 13.3 10*3/uL — ABNORMAL HIGH (ref 1.7–7.7)
Neutrophils Relative %: 72 % (ref 43–77)
Platelets: 437 10*3/uL — ABNORMAL HIGH (ref 150–400)
RBC: 4.9 MIL/uL (ref 4.22–5.81)
RDW: 15.3 % (ref 11.5–15.5)
WBC: 18.5 10*3/uL — ABNORMAL HIGH (ref 4.0–10.5)

## 2012-06-26 LAB — BASIC METABOLIC PANEL
BUN: 8 mg/dL (ref 6–23)
CO2: 27 mEq/L (ref 19–32)
Calcium: 8.9 mg/dL (ref 8.4–10.5)
Chloride: 99 mEq/L (ref 96–112)
Creatinine, Ser: 0.6 mg/dL (ref 0.50–1.35)
GFR calc Af Amer: >90 mL/min (ref >90)
GFR calc non Af Amer: >90 mL/min (ref >90)
Glucose, Bld: 77 mg/dL (ref 70–99)
Potassium: 4.3 mEq/L (ref 3.5–5.1)
Sodium: 136 mEq/L (ref 135–145)

## 2012-06-26 MED ORDER — VANCOMYCIN HCL IN DEXTROSE 1-5 GM/200ML-% IV SOLN
1000.0000 mg | Freq: Once | INTRAVENOUS | Status: AC
  Administered 2012-06-26: 1000 mg via INTRAVENOUS
  Filled 2012-06-26: qty 200

## 2012-06-26 MED ORDER — ENOXAPARIN SODIUM 60 MG/0.6ML ~~LOC~~ SOLN
60.0000 mg | SUBCUTANEOUS | Status: DC
  Administered 2012-06-26 – 2012-06-28 (×3): 60 mg via SUBCUTANEOUS
  Filled 2012-06-26 (×4): qty 0.6

## 2012-06-26 MED ORDER — SODIUM CHLORIDE 0.9 % IV SOLN
INTRAVENOUS | Status: DC
  Administered 2012-06-26: 17:00:00 via INTRAVENOUS

## 2012-06-26 MED ORDER — IMIPRAMINE HCL 50 MG PO TABS
50.0000 mg | ORAL_TABLET | Freq: Every day | ORAL | Status: DC
  Administered 2012-06-26 – 2012-06-28 (×3): 50 mg via ORAL
  Filled 2012-06-26 (×4): qty 1

## 2012-06-26 MED ORDER — OXYBUTYNIN CHLORIDE ER 15 MG PO TB24
30.0000 mg | ORAL_TABLET | Freq: Every day | ORAL | Status: DC
  Administered 2012-06-26 – 2012-06-28 (×3): 30 mg via ORAL
  Filled 2012-06-26 (×4): qty 2

## 2012-06-26 MED ORDER — VANCOMYCIN HCL IN DEXTROSE 1-5 GM/200ML-% IV SOLN
1000.0000 mg | INTRAVENOUS | Status: AC
  Administered 2012-06-26: 1000 mg via INTRAVENOUS
  Filled 2012-06-26: qty 200

## 2012-06-26 MED ORDER — SODIUM CHLORIDE 0.9 % IV SOLN: INTRAVENOUS | Status: AC

## 2012-06-26 MED ORDER — BACLOFEN 10 MG PO TABS
10.0000 mg | ORAL_TABLET | Freq: Three times a day (TID) | ORAL | Status: DC
  Administered 2012-06-26 – 2012-06-29 (×8): 10 mg via ORAL
  Filled 2012-06-26 (×10): qty 1

## 2012-06-26 MED ORDER — ENOXAPARIN SODIUM 40 MG/0.4ML ~~LOC~~ SOLN
40.0000 mg | SUBCUTANEOUS | Status: DC
  Filled 2012-06-26: qty 0.4

## 2012-06-26 MED ORDER — VANCOMYCIN HCL IN DEXTROSE 1-5 GM/200ML-% IV SOLN
1000.0000 mg | Freq: Two times a day (BID) | INTRAVENOUS | Status: DC
  Administered 2012-06-27 – 2012-06-28 (×4): 1000 mg via INTRAVENOUS
  Filled 2012-06-26 (×5): qty 200

## 2012-06-26 MED ORDER — CITALOPRAM HYDROBROMIDE 40 MG PO TABS
40.0000 mg | ORAL_TABLET | Freq: Every day | ORAL | Status: DC
  Administered 2012-06-26 – 2012-06-28 (×3): 40 mg via ORAL
  Filled 2012-06-26 (×4): qty 1

## 2012-06-26 MED ORDER — THERA M PLUS PO TABS
1.0000 | ORAL_TABLET | Freq: Every day | ORAL | Status: DC
  Administered 2012-06-26 – 2012-06-28 (×3): 1 via ORAL
  Filled 2012-06-26 (×4): qty 1

## 2012-06-26 MED ORDER — DIAZEPAM 5 MG PO TABS
10.0000 mg | ORAL_TABLET | Freq: Every day | ORAL | Status: DC
  Administered 2012-06-26 – 2012-06-28 (×3): 10 mg via ORAL
  Filled 2012-06-26 (×3): qty 2

## 2012-06-26 NOTE — ED Notes (Signed)
Family at bedside. 
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
## 2012-06-26 NOTE — ED Notes (Signed)
Attempted to call report, awaiting rn to return call 

## 2012-06-26 NOTE — ED Notes (Signed)
Gave report to rebecca rn , pt going to [phone]
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
## 2012-06-26 NOTE — ED Notes (Signed)
MD at bedside. 

## 2012-06-27 ENCOUNTER — Ambulatory Visit: Payer: PRIVATE HEALTH INSURANCE | Admitting: Internal Medicine

## 2012-06-27 ENCOUNTER — Encounter (HOSPITAL_BASED_OUTPATIENT_CLINIC_OR_DEPARTMENT_OTHER): Payer: PRIVATE HEALTH INSURANCE

## 2012-06-27 DIAGNOSIS — L8990 Pressure ulcer of unspecified site, unspecified stage: Secondary | ICD-10-CM

## 2012-06-27 LAB — CBC
HCT: 35.3 % — ABNORMAL LOW (ref 39.0–52.0)
Hemoglobin: 11.9 g/dL — ABNORMAL LOW (ref 13.0–17.0)
MCH: 26.9 pg (ref 26.0–34.0)
MCHC: 33.7 g/dL (ref 30.0–36.0)
MCV: 79.7 fL (ref 78.0–100.0)
Platelets: 468 10*3/uL — ABNORMAL HIGH (ref 150–400)
RBC: 4.43 MIL/uL (ref 4.22–5.81)
RDW: 15.5 % (ref 11.5–15.5)
WBC: 13 10*3/uL — ABNORMAL HIGH (ref 4.0–10.5)

## 2012-06-27 MED ORDER — COLLAGENASE 250 UNIT/GM EX OINT
TOPICAL_OINTMENT | Freq: Every day | CUTANEOUS | Status: DC
  Administered 2012-06-27 – 2012-06-29 (×3): via TOPICAL
  Filled 2012-06-27: qty 30

## 2012-06-27 MED ORDER — MUPIROCIN 2 % EX OINT
1.0000 "application " | TOPICAL_OINTMENT | Freq: Two times a day (BID) | CUTANEOUS | Status: DC
  Administered 2012-06-27 – 2012-06-29 (×5): 1 via NASAL
  Filled 2012-06-27: qty 22

## 2012-06-27 MED ORDER — CHLORHEXIDINE GLUCONATE CLOTH 2 % EX PADS
6.0000 | MEDICATED_PAD | Freq: Every day | CUTANEOUS | Status: DC
  Administered 2012-06-27 – 2012-06-29 (×3): 6 via TOPICAL
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
## 2012-06-27 NOTE — Consult Note (Addendum)
WOC consult Note Reason for Consult: LLE wounds. IP MD in with me to examine wounds.   Patient has had surgery (surgical debridement with placement of A-Cell on 7/24, NPWT x 2 weeks). [facility] and was referred for admission. Patient in house for IP consultation and treatment of suspected cellulitis. Patient states that MD Sanger may be considering additional debridement surgery.  [facility] post discharge. Wound type:Complex and chronic surgical wound, original etiology venous insufficiency vs pressure vs arterial. Pressure Ulcer POA: No Measurement: Open wound at forefoot and continuing to space between 1st and 2nd digit and beyond to plantar aspect of foot measures 11xm x 4cm and is 100% covered with necrotic tissue.  Wound at left lateral LE measures 12cm x 2.5cm x .4cm and is full thickness. No necrotic tissue noted. Wound WUJ:[id number], interdigit space and plantar foot wound presents with necrotic tissue. Left LE (lateral) is clean, pink and non-granulating Drainage (amount, consistency, odor) moderate amount yellow/green exudate Periwound:intact Dressing procedure/placement/frequency:We will begin silver hydrofiber (Aquacel Ag+, [facility] [place] [id number]) to the LLE and Santyl (collagenase) to the foot wound.  Orders provided. I will not follow, but will remain available to this patient's medical, surgical and nursing teams.  Please re-consult if needed. Thanks, Ladona Mow, MSN, RN, [facility], CWOCN [phone])

## 2012-06-27 NOTE — Progress Notes (Signed)
[facility]  NAME:  CRIBBUlysees, Robarts                  ACCOUNT NO.:  [id number]  MEDICAL RECORD NO.:  [id number]      DATE OF BIRTH:  1986/10/19  PHYSICIAN:  Wayland Denis, DO       VISIT DATE:  06/26/2012                                  OFFICE VISIT   The patient is a 27 year old male who is here for followup on his left lower extremity ulcers.  He underwent irrigation and debridement with A- Cell and VAC placement on the leg and irrigation and debridement with A- Cell placement and a dressing on his foot.  Today, he says that he is feeling a little bit weak possibly feverish.  He has got increased swelling in his foot, increased redness in his leg and there is a grayish color to the wound base, this could be secondary to the [facility], but it is very concerning with the swelling and the redness for increase infection.  He is off the antibiotics and since he is insensate, this is concerning that he could get sick quickly.  Therefore, we recommend a visit to the emergency room with further evaluation and possible x-ray with possible antibiotics.  Otherwise, we will continue with VAC and see him back in a week and we will call to the ER and check on him as well.     Wayland Denis, DO     CS/MEDQ  D:  06/26/2012  T:  06/27/2012  Job:  [id number]
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
## 2012-06-28 LAB — VANCOMYCIN, TROUGH: Vancomycin Tr: <5 ug/mL — ABNORMAL LOW (ref 10.0–20.0)

## 2012-06-28 LAB — SEDIMENTATION RATE: Sed Rate: 51 mm/hr — ABNORMAL HIGH (ref 0–16)

## 2012-06-28 MED ORDER — VANCOMYCIN HCL 1000 MG IV SOLR
1250.0000 mg | Freq: Three times a day (TID) | INTRAVENOUS | Status: DC
  Administered 2012-06-29 (×2): 1250 mg via INTRAVENOUS
  Filled 2012-06-28 (×3): qty 1250

## 2012-06-28 NOTE — Progress Notes (Signed)
INFECTIOUS DISEASE PROGRESS NOTE  ID: [name] is a 26 y.o. male with chronic wound of left leg, history of spinal cord injury 2005 sent from wound care for increased purulence of foot wound and surrounding erythema.    Subjective: Feels ok, no chills  Abtx:  Anti-infectives     Start     Dose/Rate Route Frequency Ordered Stop   06/27/12 0800   vancomycin (VANCOCIN) IVPB 1000 mg/200 mL premix        1,000 mg 200 mL/hr over 60 Minutes Intravenous Every 12 hours 06/26/12 1752     06/26/12 1800   vancomycin (VANCOCIN) IVPB 1000 mg/200 mL premix        1,000 mg 200 mL/hr over 60 Minutes Intravenous NOW 06/26/12 1745 06/26/12 2008   06/26/12 1530   vancomycin (VANCOCIN) IVPB 1000 mg/200 mL premix        1,000 mg 200 mL/hr over 60 Minutes Intravenous  Once 06/26/12 1500 06/26/12 1807          Medications: I have reviewed the patient's current medications.  Objective: Vital signs in last 24 hours: Temp:  [97.9 F (36.6 C)-98.3 F (36.8 C)] 97.9 F (36.6 C) (08/07 0635) Pulse Rate:  [78-101] 78  (08/07 0635) Resp:  [18-20] 18  (08/07 0635) BP: (137-139)/(79-82) 137/82 mmHg (08/07 0635) SpO2:  [99 %-100 %] 100 % (08/07 0635)   General appearance: alert, cooperative and no distress Resp: clear to auscultation bilaterally Extremities: leg wrapped  Lab Results  Basename 06/27/12 0342 06/26/12 1450  WBC 13.0* 18.5*  HGB 11.9* 13.0  HCT 35.3* 39.4  NA -- 136  K -- 4.3  CL -- 99  CO2 -- 27  BUN -- 8  CREATININE -- 0.60  GLU -- --   Liver Panel No results found for this basename: PROT:2,ALBUMIN:2,AST:2,ALT:2,ALKPHOS:2,BILITOT:2,BILIDIR:2,IBILI:2 in the last 72 hours Sedimentation Rate No results found for this basename: ESRSEDRATE in the last 72 hours C-Reactive Protein No results found for this basename: CRP:2 in the last 72 hours  Microbiology: Recent Results (from the past 240 hour(s))  WOUND CULTURE     Status: Normal (Preliminary result)   Collection Time    06/27/12  6:45 AM      Component Value Range Status Comment   Specimen Description LEG LEFT   Final    Special Requests Normal   Final    Gram Stain     Final    Value: NO WBC SEEN     NO SQUAMOUS EPITHELIAL CELLS SEEN     NO ORGANISMS SEEN   Culture Culture reincubated for better growth   Final    Report Status PENDING   Incomplete     Studies/Results: No results found.   Assessment/Plan: 1) wound infection and cellulitis - significant purulence and erythema noted on my exam.  Recently on doxycycline for MRSA and keflex for MSSA (doxy resistant) with some improvement but again worsened.  On vancomycin and WBC improved.  Culture negative.  Does not probe to bone.   -I have ordered a PICC -Continue vancomycin for 3 weeks and reassess in [facility] (ID clinic) before stopping therapy in 2-3 weeks.  Weekly CBC, CMP, Vanco trough; management per home health protocol.  Do not pull picc until seen in clinic.  Arrangements will be made for follow up.      Makih Stefanko Infectious Diseases 06/28/2012, 2:14 PM
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
## 2012-06-28 NOTE — Progress Notes (Addendum)
ANTIBIOTIC CONSULT NOTE - FOLLOW UP  Pharmacy Consult for:  Vancomycin Indication:  Left leg infection  Allergies  Allergen Reactions  . Sulfa Antibiotics Hives  . Levofloxacin Rash  . Penicillins Rash  . Zyvox (Linezolid) Rash    Patient Measurements: Height: 5\' 10"  (177.8 cm) Weight: 254 lb 13.6 oz (115.6 kg) IBW/kg (Calculated) : 73   Vital Signs: Temp: 97.6 F (36.4 C) (08/07 2218) Temp src: Oral (08/07 2218) BP: 127/68 mmHg (08/07 2218) Pulse Rate: 79  (08/07 2218) Intake/Output from previous day: 08/06 0701 - 08/07 0700 In: 920 [P.O.:720; IV Piggyback:200] Out: 1500 [Urine:1500]  Labs:  [facility] 06/27/12 0342 06/26/12 1450  WBC 13.0* 18.5*  HGB 11.9* 13.0  PLT 468* 437*  LABCREA -- --  CREATININE -- 0.60   Estimated Creatinine Clearance: 182.8 ml/min (by C-G formula based on Cr of 0.6).  Basename 06/28/12 1938  VANCOTROUGH <5.0*  VANCOPEAK --  Drue Dun --  GENTTROUGH --  GENTPEAK --  GENTRANDOM --  TOBRATROUGH --  Nolen Mu --  TOBRARND --  AMIKACINPEAK --  AMIKACINTROU --  AMIKACIN --     Microbiology: Recent Results (from the past 720 hour(s))  WOUND CULTURE     Status: Normal (Preliminary result)   Collection Time   06/27/12  6:45 AM      Component Value Range Status Comment   Specimen Description LEG LEFT   Final    Special Requests Normal   Final    Gram Stain     Final    Value: NO WBC SEEN     NO SQUAMOUS EPITHELIAL CELLS SEEN     NO ORGANISMS SEEN   Culture Culture reincubated for better growth   Final    Report Status PENDING   Incomplete     Anti-infectives     Start     Dose/Rate Route Frequency Ordered Stop   06/29/12 0200   vancomycin (VANCOCIN) 1,250 mg in sodium chloride 0.9 % 250 mL IVPB        1,250 mg 166.7 mL/hr over 90 Minutes Intravenous Every 8 hours 06/28/12 2209     06/27/12 0800   vancomycin (VANCOCIN) IVPB 1000 mg/200 mL premix  Status:  Discontinued        1,000 mg 200 mL/hr over 60 Minutes Intravenous  Every 12 hours 06/26/12 1752 06/28/12 2209   06/26/12 1800   vancomycin (VANCOCIN) IVPB 1000 mg/200 mL premix        1,000 mg 200 mL/hr over 60 Minutes Intravenous NOW 06/26/12 1745 06/26/12 2008   06/26/12 1530   vancomycin (VANCOCIN) IVPB 1000 mg/200 mL premix        1,000 mg 200 mL/hr over 60 Minutes Intravenous  Once 06/26/12 1500 06/26/12 1807          Assessment:  Assisting with Vancomycin therapy for this 26 year old male with paraplegia due to a spinal cord injury.  Mr. Berger was referred from wound care clinic for purulent drainage and significant surrounding erythema of lower portion of a chronic left leg wound and first two toes.   Prior to admission received a course of therapy with doxycycline and Keflex.  Recent cultures were positive for MSSA and MRSA.  The Vancomycin trough level drawn prior to the 8 pm dose tonight was reported as < 5 mcg/ml.    Goal of Therapy:   Vancomycin trough levels 10-15 mcg/ml  Eradication of infection.  Plan:  Increase the Vancomycin dose to 1250 mg IV every 8 hours to achieve trough  levels in the therapeutic range.  Polo Riley R.Ph. 06/28/2012,10:41 PM

## 2012-06-29 ENCOUNTER — Inpatient Hospital Stay (HOSPITAL_COMMUNITY): Payer: PRIVATE HEALTH INSURANCE

## 2012-06-29 LAB — CBC
HCT: 37.4 % — ABNORMAL LOW (ref 39.0–52.0)
Hemoglobin: 12.3 g/dL — ABNORMAL LOW (ref 13.0–17.0)
MCH: 26.5 pg (ref 26.0–34.0)
MCHC: 32.9 g/dL (ref 30.0–36.0)
MCV: 80.6 fL (ref 78.0–100.0)
Platelets: 489 10*3/uL — ABNORMAL HIGH (ref 150–400)
RBC: 4.64 MIL/uL (ref 4.22–5.81)
RDW: 15.2 % (ref 11.5–15.5)
WBC: 13.1 10*3/uL — ABNORMAL HIGH (ref 4.0–10.5)

## 2012-06-29 LAB — C-REACTIVE PROTEIN: CRP: 3.9 mg/dL — ABNORMAL HIGH (ref <0.60)

## 2012-06-29 MED ORDER — SODIUM CHLORIDE 0.9 % IJ SOLN: 10.0000 mL | Freq: Two times a day (BID) | INTRAMUSCULAR | Status: DC

## 2012-06-29 MED ORDER — COLLAGENASE 250 UNIT/GM EX OINT: TOPICAL_OINTMENT | Freq: Every day | CUTANEOUS | Status: AC

## 2012-06-29 MED ORDER — VANCOMYCIN HCL 1000 MG IV SOLR
1750.0000 mg | Freq: Once | INTRAVENOUS | Status: AC
  Administered 2012-06-29: 1750 mg via INTRAVENOUS
  Filled 2012-06-29: qty 1750

## 2012-06-29 MED ORDER — SODIUM CHLORIDE 0.9 % IJ SOLN: 10.0000 mL | INTRAMUSCULAR | Status: DC | PRN

## 2012-06-29 MED ORDER — VANCOMYCIN HCL 1000 MG IV SOLR: 1250.0000 mg | Freq: Three times a day (TID) | INTRAVENOUS | Status: DC

## 2012-06-29 MED ORDER — HEPARIN SOD (PORK) LOCK FLUSH 100 UNIT/ML IV SOLN
INTRAVENOUS | Status: AC
  Filled 2012-06-29: qty 5

## 2012-06-29 NOTE — Progress Notes (Signed)
Peripherally Inserted Central Catheter/Midline Placement  The IV Nurse has discussed with the patient and/or persons authorized to consent for the patient, the purpose of this procedure and the potential benefits and risks involved with this procedure.  The benefits include less needle sticks, lab draws from the catheter and patient may be discharged home with the catheter.  Risks include, but not limited to, infection, bleeding, blood clot (thrombus formation), and puncture of an artery; nerve damage and irregular heat beat.  Alternatives to this procedure were also discussed.  PICC/Midline Placement Documentation        [name] 06/29/2012, 9:13 AM

## 2012-06-29 NOTE — Care Management Note (Signed)
    Page 1 of 1   06/29/2012     11:28:31 AM   CARE MANAGEMENT NOTE 06/29/2012  Patient:  [name], [name]   Account Number:  [id number]  Date Initiated:  06/29/2012  Documentation initiated by:  CRAFT,TERRI  Subjective/Objective Assessment:   26 yo male admitted 06/26/12 with infected decubitus ulcer     Action/Plan:   D/C when medically stable   Anticipated DC Date:  07/02/2012   Anticipated DC Plan:  HOME W HOME HEALTH SERVICES      DC Planning Services  CM consult      [facility] Choice  HOME HEALTH   Choice offered to / List presented to:  C-1 Patient        HH arranged  HH-1 RN  IV Antibiotics      HH agency  [facility]   Status of service:  Completed, signed off  Discharge Disposition:  HOME W HOME HEALTH SERVICES  Per UR Regulation:  Reviewed for med. necessity/level of care/duration of stay  Comments:  06/29/12, Kathi Der RNC-MNN, BSN, [phone], CM received referral.  CM met with pt to offer choice for [facility] services. Pt selected [facility].  Darl Pikes at [facility] contacted with orders and confirmation of services received.  Pt states his fiance will be able to assist him as needed.

## 2012-06-29 NOTE — Progress Notes (Signed)
06/29/12, Kathi Der RNC-MNN, BSN, [phone], CM received phone call from [facility] stating they would be unable to provide the [facility] services due to lack of staffing.  CM called [facility].  Genevieve Norlander stated they would be unable to provide services as well due to lack of staffing.  CM called [facility].  [facility] stated they would be able to provide [facility] services ordered.  All needed information faxed to [facility] with confirmation.

## 2012-06-29 NOTE — Progress Notes (Signed)
Pharmacy - Vancomycin Dosing  Patient is leaving this afternoon on Vancomycin 1250 mg IV q8h.  RN stated Home Health ok with this dosing interval but will not be able to see patient until tomorrow morning.  Next vancomycin dose due at 1800 and 0200.  To compensate, will give Vancomycin 1750 mg IV x 1 now to cover for 1800 and 0200 dose.  Home health will administer 1250 mg VI q8h starting tomorrow at 1000.  Thank you.  Geoffry Paradise, PharmD, BCPS Pager: [phone] 4:15 PM
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
## 2012-06-30 LAB — WOUND CULTURE
Gram Stain: NONE SEEN
Special Requests: NORMAL

## 2012-07-01 LAB — WOUND CULTURE: Gram Stain: NONE SEEN

## 2012-07-05 NOTE — Progress Notes (Signed)
[facility]  NAME:  [name], [name]                       ACCOUNT NO.:  MEDICAL RECORD NO.:  [id number]      DATE OF BIRTH:  06/08/1986  PHYSICIAN:  Wayland Denis, DO       VISIT DATE:  07/03/2012                                  OFFICE VISIT   The patient is a 26 year old male who is here with his significant other for evaluation of his left lower extremity ulcers.  He underwent irrigation and debridement with ACell placement and the VAC on the leg, and irrigation and debridement with ACell placement on his foot.  He has increased swelling and redness on last visit and was sent to the ER.  He was admitted for 4 days and placed on IV vancomycin.  He is getting IV vancomycin at home via a PICC line.  This seems to have helped a significant amount and he is doing much better.  There is no change in his social history.  He is now using Aquacel Ag on the leg and Santyl on the foot.  This seems to be helping.  So, we will continue with this and plan on the operating room for more ACell placement possibly in September.  The patient needs to think about it and will let us know what he decides as we get closer to the time.     Wayland Denis, DO     CS/MEDQ  D:  07/03/2012  T:  07/04/2012  Job:  [id number]

## 2012-07-11 NOTE — Progress Notes (Signed)
[facility]  NAME:  [name], [name]                       ACCOUNT NO.:  MEDICAL RECORD NO.:  [id number]      DATE OF BIRTH:  1985/12/22  PHYSICIAN:  Wayland Denis, DO       VISIT DATE:  07/10/2012                                  OFFICE VISIT   The patient is a 26 year old gentleman who is here for followup on his left lower extremity ulcers on the foot and leg area.  He had irrigation and debridement with ACell placement and the VAC.  He had a flare up of the foot area and was seen in the ER.  IV antibiotics was administered. He is now getting Aquacel Ag and doing much better.  He does not want to have a surgery at this time, but agrees that in September he may be ready.  The areas look much better, less red, and less swollen.  He is getting some granulation tissue.  It does not appear to be infected.  We will continue with Aquacel Ag and have him follow up for possible skin graft placement or more ACell placement in the next several weeks.     Wayland Denis, DO     CS/MEDQ  D:  07/10/2012  T:  07/11/2012  Job:  [id number]

## 2012-07-12 ENCOUNTER — Encounter: Payer: Self-pay | Admitting: Internal Medicine

## 2012-07-12 ENCOUNTER — Ambulatory Visit (INDEPENDENT_AMBULATORY_CARE_PROVIDER_SITE_OTHER): Payer: PRIVATE HEALTH INSURANCE | Admitting: Internal Medicine

## 2012-07-12 VITALS — BP 147/93 | HR 111 | Temp 98.3°F

## 2012-07-12 DIAGNOSIS — IMO0002 Reserved for concepts with insufficient information to code with codable children: Secondary | ICD-10-CM

## 2012-07-12 MED ORDER — CIPROFLOXACIN HCL 500 MG PO TABS: 500.0000 mg | ORAL_TABLET | Freq: Two times a day (BID) | ORAL | Status: AC

## 2012-07-12 NOTE — Progress Notes (Signed)
[facility]  RFV: hospital follow up for cellulitis/slowly healing chronic ulcer to left foot Subjective:    Patient ID: [name], male    DOB: 12/12/1985, 26 y.o.   MRN: [id number]  HPI [name] is a 26 y.o. male with spinal cord injury 2005 and chronic left leg wound on lateral lower portion and 1st-2nd toe who was seen by Dr. Kelly Splinter of wound care and noted to have purulent drainage and significant surrounding erythema. Recent wound VAC placed. Has had recent cultures positive for MSSA (tetracycline resistant) and MRSA and was on keflex and doxycycline. Antibiotics were then stopped. Denies fever or chills. He underwent debridement in July 24th but still had poor wound healing. He was admitted in early august for evaluation of wound, IV antibiotics. He was seen by Dr. Luciana Axe in consultation who recommended to place on IV vancomycin for 3-4wks, and he was recultured but found to also have enterobacter. This appears to be superficial wound culture. He was only discharged from hospital on vancomycin and wound culture results on 8/7 do not appear to have been communicated to our office or Dr. Earlie Server.  He denies fever, chills, nightsweats, rash, diarrhea. His picc line is not tender or erythematous. Has not been able to draw blood from it but infusion still ok. His fiance helps him with infusions  He presents today after 2 weeks of therapy, but his wound still has a bit fibrinous exudate on the dorsum of his foot between 1 and 2nd digit. The lateral lesions appear to be improving. 2 of 3 lesions show good granulation tissue but one area looks similar to the dorsum of his foot. No enduration, but still has blanching erythema.  Micro: Cultures from 8/7 showed enterobacter - pansensitive Culture fro 8/6 showed MSSA (oxa S but tetracycline R)  Current Outpatient Prescriptions on File Prior to Visit  Medication Sig Dispense Refill  . baclofen (LIORESAL) 10 MG tablet Take 10 mg by  mouth 3 (three) times daily.       . citalopram (CELEXA) 40 MG tablet Take 40 mg by mouth at bedtime.       . diazepam (VALIUM) 10 MG tablet Take 10 mg by mouth at bedtime.       . fexofenadine (ALLEGRA) 180 MG tablet Take 180 mg by mouth at bedtime.       Marland Kitchen imipramine (TOFRANIL) 50 MG tablet Take 50 mg by mouth at bedtime.       . Multiple Vitamins-Minerals (MULTIVITAMINS THER. W/MINERALS) TABS Take 1 tablet by mouth at bedtime.       Marland Kitchen oxybutynin (DITROPAN XL) 15 MG 24 hr tablet Take 30 mg by mouth at bedtime.       . sodium chloride 0.9 % SOLN 250 mL with vancomycin 1000 MG SOLR 1,250 mg Inject 1,250 mg into the vein every 8 (eight) hours.  63 each  0   Active Ambulatory Problems    Diagnosis Date Noted  . Decubitus ulcer of left leg 04/27/2012  . 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 History of DVT (deep vein thrombosis) 05/01/2012  . 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 Leg fracture, right 05/01/2012  . Lower paraplegia   . Spinal cord injury of T10 vertebra   . History of DVT of lower extremity   . Leukocytosis 06/26/2012  . Infected decubitus ulcer 06/26/2012   Resolved Ambulatory Problems    Diagnosis Date Noted  . No Resolved Ambulatory Problems   Past Medical  History  Diagnosis Date  . Seasonal allergies   . Mild acid reflux WATCHES DIET  . Urge incontinence   . Spastic neurogenic bladder   . Self-catheterizes urinary bladder 4 TO 6 TIMES DAILY AND PRN  . Leg ulcer, left   . OSA on CPAP MODERATE  . Wheelchair bound    History   Social History  . Marital Status: Single    Spouse Name: N/A    Number of Children: N/A  . Years of Education: N/A   Occupational History  . Not on file.   Social History Main Topics  . Smoking status: Former Smoker -- 0.2 packs/day for 4 years    Types: Cigarettes    Quit date: 02/08/2012  . Smokeless tobacco: Current User    Types: Chew, Snuff  . Alcohol Use: Yes     OCCASIONAL  . Drug Use: No  . Sexually  Active: Not on file   Other Topics Concern  . Not on file   Social History Narrative  . No narrative on file  family history is not on file.  Review of Systems  Constitutional: Negative for fever, chills, diaphoresis, activity change, appetite change, fatigue and unexpected weight change.  HENT: Negative for congestion, sore throat, rhinorrhea, sneezing, trouble swallowing and sinus pressure.  Eyes: Negative for photophobia and visual disturbance.  Respiratory: Negative for cough, chest tightness, shortness of breath, wheezing and stridor.  Cardiovascular: Negative for chest pain, palpitations and leg swelling.  Gastrointestinal: Negative for nausea, vomiting, abdominal pain, diarrhea, constipation, blood in stool, abdominal distention and anal bleeding.  Genitourinary: Negative for dysuria, hematuria, flank pain and difficulty urinating.  Musculoskeletal: lower extremity weakness, spasm due to SCI Skin: per hpi Neurological: Negative for dizziness, tremors, weakness and light-headedness.  Hematological: Negative for adenopathy. Does not bruise/bleed easily.         Objective:   Physical Exam BP 147/93  Pulse 111  Temp 98.3 F (36.8 C) (Oral) Physical Exam  Constitutional: He is oriented to person, place, and time. He appears well-developed and well-nourished. No distress.  HENT:  Mouth/Throat: Oropharynx is clear and moist. No oropharyngeal exudate.  Cardiovascular: Normal rate, regular rhythm and normal heart sounds. Exam reveals no gallop and no friction rub.  No murmur heard.  Pulmonary/Chest: Effort normal and breath sounds normal. No respiratory distress. He has no wheezes.  Abdominal: Soft. Bowel sounds are normal. He exhibits no distension. There is no tenderness.  Lymphadenopathy:  no cervical adenopathy.  Neurological: LE flaccid. spasm Skin: left foot still has erythema to mid dorsum of foot, large 3.5cm area with exudate at base of wound between 1st and 2nd  digit. Lateral ulcers have good granulation tissue. Psychiatric: He has a normal mood and affect. His behavior is normal.          Assessment & Plan:  Polymicrobial wound infection/chronic ulcer = since the wound bed does still have fibrinous exudate despite being on IV vancomycin for the past 2 wks, I am concern that it is polymicrobial, including enterobacter. Will give him RX for 2 wks of cipro 500mg  BID for GNR coverage of soft tissue infection.  The patient has had cipro in the past and has handled it without difficulty, although he does have levo = rash on his allergy list. Will give him cipro. Instruct to call back if he develops rash.  Will extend IV vanco for an additional 7-10day for 4 wk therapy.  rtc in 10-14days.

## 2012-07-13 ENCOUNTER — Telehealth: Payer: Self-pay | Admitting: *Deleted

## 2012-07-13 NOTE — Telephone Encounter (Signed)
Called [facility], per Dr Drue Second, at [phone] to have them continue the patients IV antibiotics until he sees Dr Drue Second on 07/27/12. The advised they need written orders and gave the fax number of [phone] to fax them to. Advised them will get an order to them asap. And if there are any changes after the 07/27/12 visit the office will give them a call.

## 2012-07-25 ENCOUNTER — Encounter (HOSPITAL_BASED_OUTPATIENT_CLINIC_OR_DEPARTMENT_OTHER): Payer: PRIVATE HEALTH INSURANCE | Attending: General Surgery

## 2012-07-25 DIAGNOSIS — L89899 Pressure ulcer of other site, unspecified stage: Secondary | ICD-10-CM | POA: Insufficient documentation

## 2012-07-25 DIAGNOSIS — L0889 Other specified local infections of the skin and subcutaneous tissue: Secondary | ICD-10-CM | POA: Insufficient documentation

## 2012-07-25 DIAGNOSIS — L8993 Pressure ulcer of unspecified site, stage 3: Secondary | ICD-10-CM | POA: Insufficient documentation

## 2012-07-27 ENCOUNTER — Ambulatory Visit (INDEPENDENT_AMBULATORY_CARE_PROVIDER_SITE_OTHER): Payer: PRIVATE HEALTH INSURANCE | Admitting: Internal Medicine

## 2012-07-27 ENCOUNTER — Encounter: Payer: Self-pay | Admitting: Internal Medicine

## 2012-07-27 VITALS — BP 145/91 | HR 99 | Temp 98.2°F | Ht 70.0 in | Wt 260.0 lb

## 2012-07-27 DIAGNOSIS — IMO0002 Reserved for concepts with insufficient information to code with codable children: Secondary | ICD-10-CM

## 2012-07-27 MED ORDER — CIPROFLOXACIN HCL 500 MG PO TABS: 500.0000 mg | ORAL_TABLET | Freq: Two times a day (BID) | ORAL | Status: AC

## 2012-07-27 NOTE — Progress Notes (Signed)
[facility] DISEASES CLINIC NOTE  RFV: follow up visit for polymicrobial soft tissue infection Subjective:    Patient ID: [name], male    DOB: 09-28-1986, 26 y.o.   MRN: [id number]  HPI [name] is a 26 y.o. male with spinal cord injury 2005 and chronic left leg wound on lateral lower portion and 1st-2nd toe who was seen by Dr. Kelly Splinter of wound care and noted to have purulent drainage and significant surrounding erythema. Recent wound VAC placed. Has had recent cultures positive for MSSA (tetracycline resistant) and MRSA and was on keflex and doxycycline. Antibiotics were then stopped. Denies fever or chills. He underwent debridement in July 24th but still had poor wound healing. He was admitted in early august for evaluation of wound, IV antibiotics. He was seen by Dr. Luciana Axe in consultation who recommended to place on IV vancomycin for 3-4wks, and he was recultured but found to also have enterobacter. This appears to be superficial wound culture. He was only discharged from hospital on vancomycin and wound culture results on 8/7 do not appear to have been communicated to our office or Dr. Earlie Server.   He was placed on cipro 500mg  BID at our last visit in addition to vancomycin, 2 weeks ago.  He denies fever, chills, nightsweats, rash, diarrhea. His picc line is not tender or erythematous. Has not been able to draw blood from it but infusion still ok. His fiance helps him with infusions    his wound still has a bit fibrinous exudate on the dorsum of his foot between 1 and 2nd digit. The lateral lesions appear to be improving. 2 of 3 lesions show good granulation tissue but one area looks similar to the dorsum of his foot. No enduration, but still has blanching erythema.   Micro:  Cultures from 8/7 showed enterobacter - pansensitive  Culture fro 8/6 showed MSSA (oxa S but tetracycline R)  Current Outpatient Prescriptions on File Prior to Visit  Medication Sig Dispense Refill  . baclofen  (LIORESAL) 10 MG tablet Take 10 mg by mouth 3 (three) times daily.       . citalopram (CELEXA) 40 MG tablet Take 40 mg by mouth at bedtime.       . diazepam (VALIUM) 10 MG tablet Take 10 mg by mouth at bedtime.       . fexofenadine (ALLEGRA) 180 MG tablet Take 180 mg by mouth at bedtime.       Marland Kitchen imipramine (TOFRANIL) 50 MG tablet Take 50 mg by mouth at bedtime.       . Multiple Vitamins-Minerals (MULTIVITAMINS THER. W/MINERALS) TABS Take 1 tablet by mouth at bedtime.       Marland Kitchen oxybutynin (DITROPAN XL) 15 MG 24 hr tablet Take 30 mg by mouth at bedtime.       . sodium chloride 0.9 % SOLN 250 mL with vancomycin 1000 MG SOLR 1,250 mg Inject 1,250 mg into the vein every 8 (eight) hours.  63 each  0   Active Ambulatory Problems    Diagnosis Date Noted  . Decubitus ulcer of left leg 04/27/2012  . 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 History of DVT (deep vein thrombosis) 05/01/2012  . 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 Leg fracture, right 05/01/2012  . Lower paraplegia   . Spinal cord injury of T10 vertebra   . History of DVT of lower extremity   . Leukocytosis 06/26/2012  . Infected decubitus ulcer 06/26/2012   Resolved Ambulatory Problems  Diagnosis Date Noted  . No Resolved Ambulatory Problems   Past Medical History  Diagnosis Date  . Seasonal allergies   . Mild acid reflux WATCHES DIET  . Urge incontinence   . Spastic neurogenic bladder   . Self-catheterizes urinary bladder 4 TO 6 TIMES DAILY AND PRN  . Leg ulcer, left   . OSA on CPAP MODERATE  . Wheelchair bound      Review of Systems Review of Systems  Constitutional: Negative for fever, chills, diaphoresis, activity change, appetite change, fatigue and unexpected weight change.  HENT: Negative for congestion, sore throat, rhinorrhea, sneezing, trouble swallowing and sinus pressure.  Eyes: Negative for photophobia and visual disturbance.  Respiratory: Negative for cough, chest tightness, shortness of  breath, wheezing and stridor.  Cardiovascular: Negative for chest pain, palpitations and leg swelling.  Gastrointestinal: Negative for nausea, vomiting, abdominal pain, diarrhea, constipation, blood in stool, abdominal distention and anal bleeding.  Genitourinary: Negative for dysuria, hematuria, flank pain and difficulty urinating.  Musculoskeletal: Negative for myalgias, back pain, joint swelling, arthralgias and gait problem.  Skin: Negative for color change, pallor, rash and wound.  Neurological: Negative for dizziness, tremors, weakness and light-headedness.  Hematological: Negative for adenopathy. Does not bruise/bleed easily.  Psychiatric/Behavioral: Negative for behavioral problems, confusion, sleep disturbance, dysphoric mood, decreased concentration and agitation.       Objective:   Physical Exam  BP 145/91  Pulse 99  Temp 98.2 F (36.8 C) (Oral)  Ht 5\' 10"  (1.778 m)  Wt 260 lb (117.935 kg)  BMI 37.31 kg/m2 Physical Exam  Constitutional: He is oriented to person, place, and time. He appears well-developed and well-nourished. No distress.  HENT:  Mouth/Throat: Oropharynx is clear and moist. No oropharyngeal exudate.  Cardiovascular: Normal rate, regular rhythm and normal heart sounds. Exam reveals no gallop and no friction rub.  No murmur heard.  Pulmonary/Chest: Effort normal and breath sounds normal. No respiratory distress. He has no wheezes.  Abdominal: Soft. Bowel sounds are normal. He exhibits no distension. There is no tenderness.  Lymphadenopathy:  He has no cervical adenopathy.  Neurological: He is alert and oriented to person, place, and time.  Skin: left foot lesion on dorsum of foot between first and end toe, extending plantar aspect. It measures 4 cm long but 3.75 cm wide on dorsum, and 3cm wide at plantar aspect at base of toes. Psychiatric: He has a normal mood and affect. His behavior is normal.      Assessment & Plan:  Polymicrobial soft tissue  infection/decubitus ulcer s/p debridement + cultures with MRSA and enterobacter, currently on IV vancomycin and cipro 500mg  BID. Marginally improved.  - will Refill cipro and will continue vanco til Sunday, anticipate to change over to clindamycin  - continue on antibiotics til Sunday.  Will check ESR and CRP. Should be trending down significantly since early august.  If still elevated will place patient on clindamycin since he has sulfa allergy ( his isolate is tetracycline R)  Cell: [phone] Pharmacy- [facility] on [address]  rtc in 2 wk  Cc: dr. Wiliam Ke, seeing next tuesday

## 2012-07-28 ENCOUNTER — Other Ambulatory Visit: Payer: Self-pay | Admitting: Internal Medicine

## 2012-07-28 ENCOUNTER — Telehealth: Payer: Self-pay | Admitting: *Deleted

## 2012-07-28 LAB — C-REACTIVE PROTEIN: CRP: 1.5 mg/dL — ABNORMAL HIGH (ref <0.60)

## 2012-07-28 LAB — SEDIMENTATION RATE: Sed Rate: 1 mm/hr (ref 0–16)

## 2012-07-28 NOTE — Telephone Encounter (Signed)
Called [facility] as they are the homecare provider that takes care of this patient. Per Dr Drue Second the PICC is to be DC as of Monday 07/31/12 as he has his last dose of IV Vanc 07/30/12. Was advised they need an actual order faxed and gave [phone] as the number to fax the order to. Advised will have the doctor sign and fax the order Monday as she is out of the office at this time.  Called the patient and advised him that the PICC will be DC on Monday or Tuesday depending on the schedule of his homecare nurse. Also advised him that Dr Drue Second called in some clindamycin and he is to continue to take his cipro and it should be at the pharmacy to start on Monday. Left the information on the patient voicemail with our callback information.

## 2012-08-10 ENCOUNTER — Encounter: Payer: Self-pay | Admitting: Internal Medicine

## 2012-08-10 ENCOUNTER — Ambulatory Visit (INDEPENDENT_AMBULATORY_CARE_PROVIDER_SITE_OTHER): Payer: PRIVATE HEALTH INSURANCE | Admitting: Internal Medicine

## 2012-08-10 VITALS — BP 150/88 | HR 93 | Temp 98.3°F | Ht 70.0 in | Wt 258.0 lb

## 2012-08-10 DIAGNOSIS — L089 Local infection of the skin and subcutaneous tissue, unspecified: Secondary | ICD-10-CM

## 2012-08-10 DIAGNOSIS — Z23 Encounter for immunization: Secondary | ICD-10-CM

## 2012-08-10 MED ORDER — CLINDAMYCIN HCL 300 MG PO CAPS: 600.0000 mg | ORAL_CAPSULE | Freq: Three times a day (TID) | ORAL | Status: DC

## 2012-08-10 MED ORDER — CIPROFLOXACIN HCL 500 MG PO TABS: 500.0000 mg | ORAL_TABLET | Freq: Two times a day (BID) | ORAL | Status: DC

## 2012-08-11 ENCOUNTER — Other Ambulatory Visit: Payer: Self-pay | Admitting: *Deleted

## 2012-08-11 DIAGNOSIS — L089 Local infection of the skin and subcutaneous tissue, unspecified: Secondary | ICD-10-CM

## 2012-08-11 MED ORDER — CIPROFLOXACIN HCL 500 MG PO TABS: 500.0000 mg | ORAL_TABLET | Freq: Two times a day (BID) | ORAL | Status: DC

## 2012-08-19 NOTE — Progress Notes (Signed)
[facility]  RFV: follow up to cellulitis/deep tissue infection Subjective:    Patient ID: [name], male    DOB: 08-Sep-1986, 26 y.o.   MRN: [id number]  HPI [name]  is a 26 y.o. male with spinal cord injury 2005 and chronic left leg wound on lateral lower portion and 1st-2nd toe who was seen by Dr. Kelly Splinter of wound care and noted to have purulent drainage and significant surrounding erythema. wound cultures isolated MSSA (tetracycline resistant) and MRSA. He underwent debridement in July 24th but still had poor wound healing. He was admitted in early august for evaluation of wound, IV antibiotics. It was recommended that he receives IV vancomycin for 3-4wks, and he was recultured but found to also have enterobacter. He was then placed on cipro 500mg  BID at our last visit in addition to vancomycin, for 2 wks due to enterobacter being isolated in cultures. His wounds are very slow to heal. He has finished ciprofloxacin as well as clindamycin x 2 wks. The lesions between his 1st and 2nd toe is the slowest to heal whereas the lateral lesions appear to be improving.  2 of 3 lesions show good granulation tissue but one area looks similar to the dorsum of his foot. No enduration, but still has blanching erythema.  Micro:  Cultures from 8/7 showed enterobacter - pansensitive  Culture fro 8/6 showed MSSA (oxa S but tetracycline R) Current Outpatient Prescriptions on File Prior to Visit  Medication Sig Dispense Refill  . baclofen (LIORESAL) 10 MG tablet Take 10 mg by mouth 3 (three) times daily.       . citalopram (CELEXA) 40 MG tablet Take 40 mg by mouth at bedtime.       . diazepam (VALIUM) 10 MG tablet Take 10 mg by mouth at bedtime.       . fexofenadine (ALLEGRA) 180 MG tablet Take 180 mg by mouth at bedtime.       Marland Kitchen imipramine (TOFRANIL) 50 MG tablet Take 50 mg by mouth at bedtime.       . Multiple Vitamins-Minerals (MULTIVITAMINS THER. W/MINERALS) TABS Take 1 tablet by mouth at bedtime.        Marland Kitchen oxybutynin (DITROPAN XL) 15 MG 24 hr tablet Take 30 mg by mouth at bedtime.        Active Ambulatory Problems    Diagnosis Date Noted  . Decubitus ulcer of left leg 04/27/2012  . 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 History of DVT (deep vein thrombosis) 05/01/2012  . 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 Leg fracture, right 05/01/2012  . Lower paraplegia   . Spinal cord injury of T10 vertebra   . History of DVT of lower extremity   . Leukocytosis 06/26/2012  . Infected decubitus ulcer 06/26/2012   Resolved Ambulatory Problems    Diagnosis Date Noted  . No Resolved Ambulatory Problems   Past Medical History  Diagnosis Date  . Seasonal allergies   . Mild acid reflux WATCHES DIET  . Urge incontinence   . Spastic neurogenic bladder   . Self-catheterizes urinary bladder 4 TO 6 TIMES DAILY AND PRN  . Leg ulcer, left   . OSA on CPAP MODERATE  . Wheelchair bound     Review of Systems  Constitutional: Negative for fever, chills, diaphoresis, activity change, appetite change, fatigue and unexpected weight change.  HENT: Negative for congestion, sore throat, rhinorrhea, sneezing, trouble swallowing and sinus pressure.  Eyes: Negative for photophobia and visual disturbance.  Respiratory: Negative  for cough, chest tightness, shortness of breath, wheezing and stridor.  Cardiovascular: Negative for chest pain, palpitations and leg swelling.  Gastrointestinal: Negative for nausea, vomiting, abdominal pain, diarrhea, constipation, blood in stool, abdominal distention and anal bleeding.  Genitourinary: Negative for dysuria, hematuria, flank pain and difficulty urinating.  Musculoskeletal: Negative for myalgias, back pain, joint swelling, arthralgias and gait problem.  Skin: Negative for color change, pallor, rash and wound.  Neurological: Negative for dizziness, tremors, weakness and light-headedness.  Hematological: Negative for adenopathy. Does not bruise/bleed  easily.  Psychiatric/Behavioral: Negative for behavioral problems, confusion, sleep disturbance, dysphoric mood, decreased concentration and agitation.       Objective:   Physical Exam BP 150/88  Pulse 93  Temp 98.3 F (36.8 C) (Oral)  Ht 5\' 10"  (1.778 m)  Wt 258 lb (117.028 kg)  BMI 37.02 kg/m2  Constitutional: He is oriented to person, place, and time. He appears well-developed and well-nourished. No distress.  HENT:  Mouth/Throat: Oropharynx is clear and moist. No oropharyngeal exudate.  Cardiovascular: Normal rate, regular rhythm and normal heart sounds. Exam reveals no gallop and no friction rub.  No murmur heard.  Pulmonary/Chest: Effort normal and breath sounds normal. No respiratory distress. He has no wheezes.  Abdominal: Soft. Bowel sounds are normal. He exhibits no distension. There is no tenderness.  Lymphadenopathy:  no cervical adenopathy.  Skin: left foot lesion on dorsum of foot between first and end toe, extending plantar aspect. It measures 4 cm long but 3.75 cm wide on dorsum, and 3cm wide at plantar aspect at base of toes. Unchanged from last visit.        Assessment & Plan:  Cellulitis/soft tissue infection = will finish up an additional course of his therapy of ciprofloxacin and clindamycin x 14 days. He has been on prolonged antibiotics, and feel that this should be his last course of therapy since he has been on them for 4 wks that should have adequate tissue penetration to his wound. Will defer to wound care management for local treatment   rtc prn -2-3 wks

## 2012-08-22 ENCOUNTER — Encounter (HOSPITAL_BASED_OUTPATIENT_CLINIC_OR_DEPARTMENT_OTHER): Payer: PRIVATE HEALTH INSURANCE | Attending: General Surgery

## 2012-08-22 DIAGNOSIS — L8993 Pressure ulcer of unspecified site, stage 3: Secondary | ICD-10-CM | POA: Insufficient documentation

## 2012-08-22 DIAGNOSIS — L0889 Other specified local infections of the skin and subcutaneous tissue: Secondary | ICD-10-CM | POA: Insufficient documentation

## 2012-08-22 DIAGNOSIS — L89899 Pressure ulcer of other site, unspecified stage: Secondary | ICD-10-CM | POA: Insufficient documentation

## 2012-09-05 NOTE — Progress Notes (Signed)
[facility]  NAME:  [name], [name]                  ACCOUNT NO.:  [id number]  MEDICAL RECORD NO.:  [id number]      DATE OF BIRTH:  08-01-1986  PHYSICIAN:  Wayland Denis, DO            VISIT DATE:                                  OFFICE VISIT   The patient is a 26 year old male who is here for followup on his left lower extremity ulcers.  He underwent irrigation and debridement with ACell and VAC placement in the past.  He had some progress and then got some cellulitis in the area.  He was treated with antibiotics and admitted to the hospital.  Since then, he has been using Endoform on the leg and silver alginate on the foot area.  He has done extremely well. Both areas are much smaller than they had been previously.  There has been no change in his medications or social history.  I recommend continuing with what he is doing now as he does not want to have any surgical intervention at this time.  Certainly if he changes his mind, we will re-evaluate for debridement and ACell placement.     Wayland Denis, DO     CS/MEDQ  D:  09/04/2012  T:  09/04/2012  Job:  [id number]

## 2012-09-19 ENCOUNTER — Encounter: Payer: Self-pay | Admitting: Internal Medicine

## 2012-09-19 ENCOUNTER — Ambulatory Visit (INDEPENDENT_AMBULATORY_CARE_PROVIDER_SITE_OTHER): Payer: PRIVATE HEALTH INSURANCE | Admitting: Internal Medicine

## 2012-09-19 VITALS — BP 150/95 | HR 97 | Temp 97.8°F

## 2012-09-19 DIAGNOSIS — L089 Local infection of the skin and subcutaneous tissue, unspecified: Secondary | ICD-10-CM

## 2012-09-19 LAB — CBC WITH DIFFERENTIAL/PLATELET
Basophils Absolute: 0.1 10*3/uL (ref 0.0–0.1)
Basophils Relative: 1 % (ref 0–1)
Eosinophils Absolute: 0.2 10*3/uL (ref 0.0–0.7)
Eosinophils Relative: 2 % (ref 0–5)
HCT: 40.7 % (ref 39.0–52.0)
Hemoglobin: 13.7 g/dL (ref 13.0–17.0)
Lymphocytes Relative: 26 % (ref 12–46)
Lymphs Abs: 2.8 10*3/uL (ref 0.7–4.0)
MCH: 25.6 pg — ABNORMAL LOW (ref 26.0–34.0)
MCHC: 33.7 g/dL (ref 30.0–36.0)
MCV: 76.1 fL — ABNORMAL LOW (ref 78.0–100.0)
Monocytes Absolute: 1.4 10*3/uL — ABNORMAL HIGH (ref 0.1–1.0)
Monocytes Relative: 13 % — ABNORMAL HIGH (ref 3–12)
Neutro Abs: 6.4 10*3/uL (ref 1.7–7.7)
Neutrophils Relative %: 58 % (ref 43–77)
Platelets: 465 10*3/uL — ABNORMAL HIGH (ref 150–400)
RBC: 5.35 MIL/uL (ref 4.22–5.81)
RDW: 14.5 % (ref 11.5–15.5)
WBC: 10.9 10*3/uL — ABNORMAL HIGH (ref 4.0–10.5)

## 2012-09-19 LAB — SEDIMENTATION RATE: Sed Rate: 10 mm/hr (ref 0–16)

## 2012-09-19 LAB — C-REACTIVE PROTEIN: CRP: 0.8 mg/dL — ABNORMAL HIGH (ref <0.60)

## 2012-09-19 MED ORDER — CIPROFLOXACIN HCL 500 MG PO TABS: 500.0000 mg | ORAL_TABLET | Freq: Two times a day (BID) | ORAL | Status: DC

## 2012-09-19 MED ORDER — CLINDAMYCIN HCL 300 MG PO CAPS: 600.0000 mg | ORAL_CAPSULE | Freq: Three times a day (TID) | ORAL | Status: DC

## 2012-09-19 NOTE — Progress Notes (Signed)
INFECTIOUS DISEASES CLINIC  RFV: follow up to worsening chronic left leg and foot wound Subjective:    Patient ID: [name], male    DOB: July 03, 1986, 26 y.o.   MRN: [id number]  HPIAlex is a 26 y.o. male with spinal cord injury 2005 and chronic left leg wound on lateral lower portion and 1st-2nd toe who was seen by Dr. Kelly Splinter of wound care and noted to have purulent drainage and significant surrounding erythema noted in June 2013. wound cultures isolated MSSA (tetracycline resistant) and MRSA. He underwent debridement in July 24th but still had poor wound healing. He was admitted in early august for evaluation of wound, IV antibiotics. It was recommended that he receives IV vancomycin for 4wks, and he was recultured but found to also have enterobacter. He was then placed on cipro 500mg  BID at our last visit in addition to vancomycin, for 2 wks due to enterobacter being isolated in cultures. His wounds are very slow to heal. He was transitioned to clindamycin and ciprofloxacin for 2-3 wks then stopped since it appeared to be improved.   Now the lesions between his 1st and 2nd toe look worse and the lateral lesions appear to be improving. There is some enduration, but still has blanching erythema to the dorsum of his foot. Wound care started him back on cephalexin 500mg  BID last week with no improvement in his foot.Wound on leg looking worse, particularly between toes and increase erythema to dorsum of foot. No fever,chills, nightsweats.  He goes to wound care once per week and do dressing changed QOD to his left leg.  Historic Micro:  Cultures from 8/7 showed enterobacter - pansensitive  Culture fro 8/6 showed MSSA (oxa S but tetracycline R)  Abtx: keflex 500mg  BID x 7 days  Review of Systems 1 point ROS negative wounds per HPI    Objective:   Physical Exam BP 150/95  Pulse 97  Temp 97.8 F (36.6 C) (Oral) gen = a x o in NAD Ext = left foot , area between 1st and 2nd toe has heavy  exudate to base of wound, that extends to plantar aspect of foot, area unchanged, bed of the wound is difficult to see if good granulation tissue. Lateral left calf lesion measures 5cm long 2 cm wide, good granulation tissue       Assessment & Plan:  Chronic ulcer concern for cellulitis = given past culture results, will change to cipro and clindamycin x 2 wks. The patient does not have vascular disease to account for why this is not healing. Concern than he may have mycobacterial soft skin tissue infection.   will ask wound care to do AFB culture and regular culture at today's visit, preferably with tissue rather than swab for afb. Will contact [facility] to discuss.  rtc in 7-10days

## 2012-09-26 ENCOUNTER — Encounter (HOSPITAL_BASED_OUTPATIENT_CLINIC_OR_DEPARTMENT_OTHER): Payer: PRIVATE HEALTH INSURANCE | Attending: General Surgery

## 2012-09-26 ENCOUNTER — Telehealth: Payer: Self-pay | Admitting: *Deleted

## 2012-09-26 DIAGNOSIS — L8993 Pressure ulcer of unspecified site, stage 3: Secondary | ICD-10-CM | POA: Insufficient documentation

## 2012-09-26 DIAGNOSIS — G8220 Paraplegia, unspecified: Secondary | ICD-10-CM | POA: Insufficient documentation

## 2012-09-26 DIAGNOSIS — L02619 Cutaneous abscess of unspecified foot: Secondary | ICD-10-CM | POA: Insufficient documentation

## 2012-09-26 DIAGNOSIS — L89899 Pressure ulcer of other site, unspecified stage: Secondary | ICD-10-CM | POA: Insufficient documentation

## 2012-09-26 NOTE — Telephone Encounter (Signed)
Patient called back today to advised that wound care advised him to get in to see Dr Drue Second asap. Patient had an appt scheduled for 09/28/12 and canceled due to conflict I tried to offer him that appt and he said he just can not come. Also offered him 10/12/12 at 9 am and he advised that is not good for him either. Advised the patient he needs to see the doctor so he needs to make some changes to his schedule as this is important to his health. He advised he will call and try to rearrange his schedule and give [place] a call back asap. Advised him our appts go fast so he needs to call back soon.

## 2012-09-27 NOTE — Telephone Encounter (Signed)
[name], can you work with Asher Muir to get [name] in this week, perhaps Friday am? i can see him on my non-clinic days

## 2012-09-28 ENCOUNTER — Ambulatory Visit: Payer: PRIVATE HEALTH INSURANCE | Admitting: Internal Medicine

## 2012-09-29 ENCOUNTER — Other Ambulatory Visit: Payer: Self-pay | Admitting: Internal Medicine

## 2012-09-29 ENCOUNTER — Ambulatory Visit (INDEPENDENT_AMBULATORY_CARE_PROVIDER_SITE_OTHER): Payer: PRIVATE HEALTH INSURANCE | Admitting: Internal Medicine

## 2012-09-29 ENCOUNTER — Encounter: Payer: Self-pay | Admitting: Internal Medicine

## 2012-09-29 ENCOUNTER — Telehealth (HOSPITAL_COMMUNITY): Payer: Self-pay | Admitting: Radiology

## 2012-09-29 VITALS — BP 155/101 | HR 90 | Temp 98.0°F

## 2012-09-29 DIAGNOSIS — L97909 Non-pressure chronic ulcer of unspecified part of unspecified lower leg with unspecified severity: Secondary | ICD-10-CM

## 2012-09-29 DIAGNOSIS — L089 Local infection of the skin and subcutaneous tissue, unspecified: Secondary | ICD-10-CM

## 2012-09-29 MED ORDER — CLINDAMYCIN HCL 300 MG PO CAPS: 600.0000 mg | ORAL_CAPSULE | Freq: Three times a day (TID) | ORAL | Status: DC

## 2012-09-29 NOTE — Telephone Encounter (Signed)
Called patient to inform him of PICC insertion appointment on Monday 10-02-12 at 11:00.  Patient given directions to Radiology at [facility].  Patient did not have any questions.

## 2012-10-02 ENCOUNTER — Other Ambulatory Visit: Payer: Self-pay | Admitting: Internal Medicine

## 2012-10-02 ENCOUNTER — Ambulatory Visit (HOSPITAL_COMMUNITY): Payer: PRIVATE HEALTH INSURANCE

## 2012-10-02 DIAGNOSIS — L0390 Cellulitis, unspecified: Secondary | ICD-10-CM

## 2012-10-02 DIAGNOSIS — T148XXA Other injury of unspecified body region, initial encounter: Secondary | ICD-10-CM

## 2012-10-02 LAB — WOUND CULTURE
Gram Stain: NONE SEEN
Gram Stain: NONE SEEN

## 2012-10-02 MED ORDER — DOXYCYCLINE HYCLATE 100 MG PO TABS: 100.0000 mg | ORAL_TABLET | Freq: Two times a day (BID) | ORAL | Status: DC

## 2012-10-03 LAB — WOUND CULTURE: Gram Stain: NONE SEEN

## 2012-10-24 ENCOUNTER — Encounter (HOSPITAL_BASED_OUTPATIENT_CLINIC_OR_DEPARTMENT_OTHER): Payer: PRIVATE HEALTH INSURANCE | Attending: General Surgery

## 2012-10-24 ENCOUNTER — Telehealth: Payer: Self-pay | Admitting: *Deleted

## 2012-10-24 ENCOUNTER — Ambulatory Visit (INDEPENDENT_AMBULATORY_CARE_PROVIDER_SITE_OTHER): Payer: PRIVATE HEALTH INSURANCE | Admitting: Internal Medicine

## 2012-10-24 VITALS — BP 137/76 | HR 114 | Temp 98.0°F | Ht 70.0 in | Wt 250.0 lb

## 2012-10-24 DIAGNOSIS — L97409 Non-pressure chronic ulcer of unspecified heel and midfoot with unspecified severity: Secondary | ICD-10-CM

## 2012-10-24 DIAGNOSIS — G8220 Paraplegia, unspecified: Secondary | ICD-10-CM | POA: Insufficient documentation

## 2012-10-24 DIAGNOSIS — L8993 Pressure ulcer of unspecified site, stage 3: Secondary | ICD-10-CM | POA: Insufficient documentation

## 2012-10-24 DIAGNOSIS — L02619 Cutaneous abscess of unspecified foot: Secondary | ICD-10-CM | POA: Insufficient documentation

## 2012-10-24 DIAGNOSIS — L03119 Cellulitis of unspecified part of limb: Secondary | ICD-10-CM | POA: Insufficient documentation

## 2012-10-24 DIAGNOSIS — L89899 Pressure ulcer of other site, unspecified stage: Secondary | ICD-10-CM | POA: Insufficient documentation

## 2012-10-24 DIAGNOSIS — L97509 Non-pressure chronic ulcer of other part of unspecified foot with unspecified severity: Secondary | ICD-10-CM

## 2012-10-24 NOTE — Progress Notes (Signed)
[facility] CL[facility] NOTE  RFV: chronic wound  Subjective:    Patient ID: [name], male    DOB: [birth year], 26 y.o.   MRN: [id number]  HPI [name] is a 26yo Male with spinal cord injury,with lower extremity paralysis who has had longstanding decubitis ulcer to his left leg. He was last seen on 11/8 due to worsening for non-healing left foot ulcer previously on cipro and clindamycin, repeat superficial wound swab isolated stenotrophomonas ( S- bactrim). At that time, we changed him off of cipro and clinda, and now on bactrim x 2 wks but has finished the course roughly 10 days ago. He is using santyl every other day  Calf measured 5.5 cmL by 1.8 cm Wide and left foot L 4.2 cm Wide and 3.7 cm long and 3.9cm wide at plantar aspect of foot at his last visit.  In the last 2 wks of therapy, very mild improvement  Review of Systems     Objective:   Physical Exam BP 137/76  Pulse 114  Temp 98 F (36.7 C) (Oral)  Ht 5\' 10"  (1.778 m)  Wt 250 lb (113.399 kg)  BMI 35.87 kg/m2 Physical Exam  Constitutional: He is oriented to person, place, and time. He appears well-developed and well-nourished. No distress.  HENT:  Mouth/Throat: Oropharynx is clear and moist. No oropharyngeal exudate.  Lymphadenopathy:  no cervical adenopathy.  Skin: Skin is warm and dry. Left leg wounds are unchanged from last visit, slightly less exudate on wound bed, most noteable on lateral calf lesion. Calf measured 5.5 cmL by 1.8 cm Wide and left foot L 4.2 cm Wide and 3.7 cm long and 3.9cm wide at plantar aspect of foot     Assessment & Plan:  Chronic left foot ulcer = appears slightly improved with local care. At this time, do not feel that we need to give oral antibiotics for cellulitis but only continue with local wound care. He is currently using santyl QOD which appears only marginally helpful. Will recommend a trial of pluronic cream to apply to affected area BID until the wound base is clean. Will arrange for rx to fax to  [facility] outpatient pharmacy at [phone]. We will have them mail pluronic cream to the patient. Pharmacy # [phone]  rtc in 3 wks.   Email: [email or web address]: cell [phone] for pharmacy to call him back to let him know his co-pay

## 2012-10-24 NOTE — Telephone Encounter (Signed)
Called [facility] outpatient pharmacy to give them patient's address and phone number. Left message on answering service with information, as well as our contact information should they have any questions. Andree Coss

## 2012-10-25 ENCOUNTER — Telehealth: Payer: Self-pay | Admitting: *Deleted

## 2012-10-25 ENCOUNTER — Other Ambulatory Visit: Payer: Self-pay | Admitting: *Deleted

## 2012-10-25 NOTE — Telephone Encounter (Signed)
Patient received call from pharmacy, stating that they didn't have his "name on file."  He called [place] to find out what was going on with the prescription.  RN again called the pharmacy, double checking the phone number with the patient, only to get a voicemail box.  RN left a message, checking on the status of this prescription and to make sure that they had received all necessary information that was faxed yesterday.  RN left the triage number for a return phone call if they had questions. [name]

## 2012-10-25 NOTE — Telephone Encounter (Signed)
Patient called to check on his Plumeric cream. Advised him will speak with Marcelino Duster and find out what is going on with this Rx.

## 2012-11-23 ENCOUNTER — Encounter: Payer: Self-pay | Admitting: Internal Medicine

## 2012-11-23 ENCOUNTER — Ambulatory Visit (INDEPENDENT_AMBULATORY_CARE_PROVIDER_SITE_OTHER): Payer: PRIVATE HEALTH INSURANCE | Admitting: Internal Medicine

## 2012-11-23 VITALS — BP 145/75 | HR 105 | Temp 98.6°F

## 2012-11-23 DIAGNOSIS — L89899 Pressure ulcer of other site, unspecified stage: Secondary | ICD-10-CM

## 2012-11-23 DIAGNOSIS — L8990 Pressure ulcer of unspecified site, unspecified stage: Secondary | ICD-10-CM

## 2012-11-24 NOTE — Progress Notes (Signed)
[facility] CLINIC VISIT  RFV: follow up for chronic foot ulcer/wound to left foot and lateral calf Subjective:    Patient ID: [name], male    DOB: November 26, 1985, 27 y.o.   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 At last visit, we decided to stop oral antibiotics and work with better local wound care. Instead of santyl, I prescribed pluronic (antibiotic) cream (formulated through [facility] pharmacy/wound care). It was recently delivered in the last 10 days for which, he has started using 1-2 per day. He is mainly onlly using the salve to the foot wound, not the calf where it is having some improvement with the debridement of the exudate. No extention of the erythema in surrounding the wound bed. Current Outpatient Prescriptions on File Prior to Visit  Medication Sig Dispense Refill  . baclofen (LIORESAL) 10 MG tablet Take 10 mg by mouth 3 (three) times daily.       . citalopram (CELEXA) 40 MG tablet Take 40 mg by mouth at bedtime.       . diazepam (VALIUM) 10 MG tablet Take 10 mg by mouth at bedtime.       . fexofenadine (ALLEGRA) 180 MG tablet Take 180 mg by mouth at bedtime.       . hydrochlorothiazide (HYDRODIURIL) 25 MG tablet Take 25 mg by mouth daily.      Marland Kitchen imipramine (TOFRANIL) 50 MG tablet Take 50 mg by mouth at bedtime.       . Multiple Vitamins-Minerals (MULTIVITAMINS THER. W/MINERALS) TABS Take 1 tablet by mouth at bedtime.       Marland Kitchen oxybutynin (DITROPAN XL) 15 MG 24 hr tablet Take 30 mg by mouth at bedtime.        Active Ambulatory Problems    Diagnosis Date Noted  . Decubitus ulcer of left leg 04/27/2012  . 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 History of DVT (deep vein thrombosis) 05/01/2012  . 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 Leg fracture, right 05/01/2012  . Lower paraplegia   . Spinal cord injury of T10  vertebra   . History of DVT of lower extremity   . Leukocytosis 06/26/2012  . Infected decubitus ulcer 06/26/2012   Resolved Ambulatory Problems    Diagnosis Date Noted  . No Resolved Ambulatory Problems   Past Medical History  Diagnosis Date  . Seasonal allergies   . Mild acid reflux WATCHES DIET  . Urge incontinence   . Spastic neurogenic bladder   . Self-catheterizes urinary bladder 4 TO 6 TIMES DAILY AND PRN  . Leg ulcer, left   . OSA on CPAP MODERATE  . Wheelchair bound    Social and family hx reviewed  Review of Systems 10 point ROS reviewed, nad non-contributory    Objective:   Physical Exam BP 145/75  Pulse 105  Temp 98.6 F (37 C) (Oral)  Skin= wound bed has less exudate in the dorsal aspect of wound. The lateral calf lesion still has some fibrinous exudate in the wound bed         Assessment & Plan:  Chronic wound to left foot and calf = recommend to use pluronic BID to both foot (dorsum and plantar aspect) as well as the calf lesion. Goal is to have the wound bed free of fibrinous exudate. Patient has difficulty doing  BID dressing change. Strongly encourage to aim for this goal.  Patient to send pictures every 7 days to see how it is improving.  No need for oral antibiotics at this time.  rtc in 4 wks.

## 2012-11-28 ENCOUNTER — Encounter (HOSPITAL_BASED_OUTPATIENT_CLINIC_OR_DEPARTMENT_OTHER): Payer: PRIVATE HEALTH INSURANCE | Attending: General Surgery

## 2012-11-28 DIAGNOSIS — L89899 Pressure ulcer of other site, unspecified stage: Secondary | ICD-10-CM | POA: Insufficient documentation

## 2012-11-28 DIAGNOSIS — L8993 Pressure ulcer of unspecified site, stage 3: Secondary | ICD-10-CM | POA: Insufficient documentation

## 2012-11-29 NOTE — Progress Notes (Signed)
[facility] CL[facility] NOTE   RFV: follow up on chronic ulcer to left foot. Subjective:    Patient ID: [name], male    DOB: [birth year], 27 y.o.   MRN: [id number]  HPI [name] is a 27 y.o. male with spinal cord injury 2005 and chronic left leg wound on lateral lower portion and 1st-2nd toe who was seen by Dr. Kelly Splinter of wound care and noted to have purulent drainage and significant surrounding erythema noted in June 2013. wound cultures isolated MSSA (tetracycline resistant) and MRSA. S/p debridement in July 24th but still had poor wound healing. He was admitted in early august for evaluation of wound, IV antibiotics. It was recommended that he receives IV vancomycin for 4wks, and he was recultured but found to also have enterobacter. He was then placed on cipro 500mg  BID at our last visit in addition to vancomycin, for 2 wks due to enterobacter being isolated in cultures. His wounds are very slow to heal. He was transitioned to clindamycin and ciprofloxacin for 2-3 wks then stopped since it appeared to be improved.  Now the lesions between his 1st and 2nd toe look worse and the lateral lesions appear to be improving. There is some enduration, but still has blanching erythema to the dorsum of his foot. Wound care started him back on cephalexin 500mg  BID last week with no improvement in his foot.Wound on leg looking worse, particularly between toes appears to be the same, no worseing cellulitis, but has fibrious exudate in the wound bed. He goes to wound care once per week and do dressing changed QOD to his left leg.  Current Outpatient Prescriptions on File Prior to Visit  Medication Sig Dispense Refill  . baclofen (LIORESAL) 10 MG tablet Take 10 mg by mouth 3 (three) times daily.       . citalopram (CELEXA) 40 MG tablet Take 40 mg by mouth at bedtime.       . diazepam (VALIUM) 10 MG tablet Take 10 mg by mouth at bedtime.       . fexofenadine (ALLEGRA) 180 MG tablet Take 180 mg by mouth at bedtime.       .  hydrochlorothiazide (HYDRODIURIL) 25 MG tablet Take 25 mg by mouth daily.      Marland Kitchen imipramine (TOFRANIL) 50 MG tablet Take 50 mg by mouth at bedtime.       . Multiple Vitamins-Minerals (MULTIVITAMINS THER. W/MINERALS) TABS Take 1 tablet by mouth at bedtime.       Marland Kitchen oxybutynin (DITROPAN XL) 15 MG 24 hr tablet Take 30 mg by mouth at bedtime.        Active Ambulatory Problems    Diagnosis Date Noted  . Decubitus ulcer of left leg 04/27/2012  . 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 History of DVT (deep vein thrombosis) 05/01/2012  . 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 Leg fracture, right 05/01/2012  . Lower paraplegia   . Spinal cord injury of T10 vertebra   . History of DVT of lower extremity   . Leukocytosis 06/26/2012  . Infected decubitus ulcer 06/26/2012   Resolved Ambulatory Problems    Diagnosis Date Noted  . No Resolved Ambulatory Problems   Past Medical History  Diagnosis Date  . Seasonal allergies   . Mild acid reflux WATCHES DIET  . Urge incontinence   . Spastic neurogenic bladder   . Self-catheterizes urinary bladder 4 TO 6 TIMES DAILY AND PRN  . Leg ulcer, left   . OSA on CPAP  MODERATE  . Wheelchair bound      Review of Systems     Objective:   Physical Exam BP 155/101  Pulse 90  Temp 98 F (36.7 C) (Oral) gen = a x o in NAD  Ext = left foot , area between 1st and 2nd toe has heavy exudate to base of wound, that extends to plantar aspect of foot, area unchanged, bed of the wound is difficult to see if good granulation tissue. Lateral left calf lesion measures 5cm long 2 cm wide, good granulation tissue           Assessment & Plan:   chronic ulcer = will not give empiric antibiotics. Not completely convinced that he has worsening cellulitis. Would bed still needs debridement will get a rx for pluronic cream made out of [facility] to ship to the patient to use BID and follow up with surgery

## 2012-12-26 ENCOUNTER — Encounter: Payer: Self-pay | Admitting: Internal Medicine

## 2012-12-26 ENCOUNTER — Encounter (HOSPITAL_BASED_OUTPATIENT_CLINIC_OR_DEPARTMENT_OTHER): Payer: PRIVATE HEALTH INSURANCE | Attending: General Surgery

## 2012-12-26 ENCOUNTER — Ambulatory Visit (INDEPENDENT_AMBULATORY_CARE_PROVIDER_SITE_OTHER): Payer: PRIVATE HEALTH INSURANCE | Admitting: Internal Medicine

## 2012-12-26 VITALS — BP 133/76 | HR 92 | Temp 98.2°F

## 2012-12-26 DIAGNOSIS — L89899 Pressure ulcer of other site, unspecified stage: Secondary | ICD-10-CM

## 2012-12-26 DIAGNOSIS — L8993 Pressure ulcer of unspecified site, stage 3: Secondary | ICD-10-CM | POA: Insufficient documentation

## 2012-12-26 DIAGNOSIS — L8990 Pressure ulcer of unspecified site, unspecified stage: Secondary | ICD-10-CM

## 2013-01-09 NOTE — Progress Notes (Signed)
[facility] CL[facility] NOTE   RFV: chronic non-healing ulcer Subjective:    Patient ID: [name], male    DOB: 09-Dec-1985, 27 y.o.   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 Had  Been using pluronic (antibiotic) cream (formulated through [facility] pharmacy/wound care)daily but recently worsening lesions  Current Outpatient Prescriptions on File Prior to Visit  Medication Sig Dispense Refill  . baclofen (LIORESAL) 10 MG tablet Take 10 mg by mouth 3 (three) times daily.       . citalopram (CELEXA) 40 MG tablet Take 40 mg by mouth at bedtime.       . diazepam (VALIUM) 10 MG tablet Take 10 mg by mouth at bedtime.       . fexofenadine (ALLEGRA) 180 MG tablet Take 180 mg by mouth at bedtime.       . hydrochlorothiazide (HYDRODIURIL) 25 MG tablet Take 25 mg by mouth daily.      Marland Kitchen imipramine (TOFRANIL) 50 MG tablet Take 50 mg by mouth at bedtime.       . Multiple Vitamins-Minerals (MULTIVITAMINS THER. W/MINERALS) TABS Take 1 tablet by mouth at bedtime.       Marland Kitchen oxybutynin (DITROPAN XL) 15 MG 24 hr tablet Take 30 mg by mouth at bedtime.        No current facility-administered medications on file prior to visit.     Review of Systems 10 point ROS reviewed, other than wounds, does not report any other problems    Objective:   Physical Exam BP 133/76  Pulse 92  Temp(Src) 98.2 F (36.8 C) (Oral) Physical Exam  Constitutional: He is oriented to person, place, and time. He appears well-developed and well-nourished. No distress.  Neurological: lower extremity spasticity with movement Skin: left foot between 1st and 2nd toe on dorsum and plantar aspect of foot still has irregular ulcer with purulent exudate.  2 Lateral lesions are also unchanged          Assessment & Plan:  Chronic ulcers= continue to use pluronic BID instead of daily; recommend to  go back to wound care center to see if any additional therapies can help. May need to consider repeat biopsy for culture, unclear why his ulcers are not improving

## 2013-01-24 ENCOUNTER — Ambulatory Visit (INDEPENDENT_AMBULATORY_CARE_PROVIDER_SITE_OTHER): Payer: PRIVATE HEALTH INSURANCE | Admitting: Internal Medicine

## 2013-01-24 ENCOUNTER — Encounter: Payer: Self-pay | Admitting: Internal Medicine

## 2013-01-24 ENCOUNTER — Telehealth: Payer: Self-pay | Admitting: *Deleted

## 2013-01-24 DIAGNOSIS — L97409 Non-pressure chronic ulcer of unspecified heel and midfoot with unspecified severity: Secondary | ICD-10-CM

## 2013-01-24 NOTE — Progress Notes (Signed)
[facility] CL[facility] NOTE  RFV: chronic ulcers to lower extremities Subjective:    Patient ID: [name], male    DOB: 04/27/1986, 27 y.o.   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 He has been off of  oral antibiotics for several months and work with better local wound care. He is using pluronic (antibiotic) cream (formulated through [facility] pharmacy/wound care) daily instead of twice a day. He doesn't feel that his wounds are looking any better, still have exudate in each wound bed. The lateral distal wound appears slightly bigger. He has not been back to wound care at [facility] lately.  ROS: he reports having increased spasticity to his left lower leg  He reports having difficulty doing twice a day dressing change between his fiancee and his schedule.  Current Outpatient Prescriptions on File Prior to Visit  Medication Sig Dispense Refill  . baclofen (LIORESAL) 10 MG tablet Take 10 mg by mouth 3 (three) times daily.       . citalopram (CELEXA) 40 MG tablet Take 40 mg by mouth at bedtime.       . diazepam (VALIUM) 10 MG tablet Take 10 mg by mouth at bedtime.       . fexofenadine (ALLEGRA) 180 MG tablet Take 180 mg by mouth at bedtime.       . hydrochlorothiazide (HYDRODIURIL) 25 MG tablet Take 25 mg by mouth daily.      Marland Kitchen imipramine (TOFRANIL) 50 MG tablet Take 50 mg by mouth at bedtime.       . Multiple Vitamins-Minerals (MULTIVITAMINS THER. W/MINERALS) TABS Take 1 tablet by mouth at bedtime.       Marland Kitchen oxybutynin (DITROPAN XL) 15 MG 24 hr tablet Take 30 mg by mouth at bedtime.        No current facility-administered medications on file prior to visit.   Active Ambulatory Problems    Diagnosis Date Noted  . Decubitus ulcer of left leg 04/27/2012  . 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 History of DVT (deep vein  thrombosis) 05/01/2012  . 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 Leg fracture, right 05/01/2012  . Lower paraplegia   . Spinal cord injury of T10 vertebra   . History of DVT of lower extremity   . Leukocytosis 06/26/2012  . Infected decubitus ulcer 06/26/2012   Resolved Ambulatory Problems    Diagnosis Date Noted  . No Resolved Ambulatory Problems   Past Medical History  Diagnosis Date  . Seasonal allergies   . Mild acid reflux WATCHES DIET  . Urge incontinence   . Spastic neurogenic bladder   . Self-catheterizes urinary bladder 4 TO 6 TIMES DAILY AND PRN  . Leg ulcer, left   . OSA on CPAP MODERATE  . Wheelchair bound      Review of Systems     Objective:   Physical Exam BP 125/70  Pulse 112  Temp(Src) 97.3 F (36.3 C) (Oral) Physical Exam  Constitutional: oriented to person, place, and time.  appears well-developed and well-nourished. No distress.  HENT:  Mouth/Throat: Oropharynx is clear and moist. No oropharyngeal exudate.  Cardiovascular: Normal rate, regular rhythm and normal heart sounds. Exam reveals no gallop and no friction rub.  No murmur heard.  Pulmonary/Chest: Effort normal and breath sounds normal. No respiratory distress. He has  no wheezes.  Lymphadenopathy:  no cervical adenopathy.  Neuro: flaccid paralysis to lower extremities, increased spasticity noted during dressing change to left leg Skin of left lower extremity: Lateral left leg proximal lesion is 7 cm longx 2.5cm wide unchanged; distal lesion is 4.5 cm long x 2 cm wide (slightly larger in appearance). Both have exudate, greyish in the wound bed. Ulcerative lesion between 1st and 2nd toe that involves dorsum and plantar aspect of foot is unchanged, also exudate in the wound bed. The erythema to dorsum of foot is limited to distal 1/3rd of foot. Not indurated     Assessment & Plan:   Chronic foot ulcer = disappointed that the wound bed to the ulcers are not improved after switching from santyl to  pluronic. Recommended BID dressing as much as he can. We will refer to [facility]ound care to see if they have further suggestions to how to improve his wounds. At this time, I have not prescribed further oral antibiotics, would just continue with local wound care and wound care clinic assessment.  Would also ask if patient would benefit from tissue biopsy and have cultures sent for AFB, ? Could this be mycobacterial skin infection due to m.chelonae/abscessus.  Will refer to dermatology to see if they can do skin biopsy for path and culture (aerobic/AFB culture)  rtc in 4 wks

## 2013-01-24 NOTE — Telephone Encounter (Signed)
Per Dr Drue Second made the patient a referral to [facility] [phone] #[phone]. Made the appt for Tuesday March 11 at 930 am. Advised him they are located on the main floor in the [employer] [facility]. He advised the appt was fine and if he has any problems he will call and reschedule.

## 2013-02-22 ENCOUNTER — Encounter: Payer: Self-pay | Admitting: Internal Medicine

## 2013-02-22 ENCOUNTER — Ambulatory Visit (INDEPENDENT_AMBULATORY_CARE_PROVIDER_SITE_OTHER): Payer: PRIVATE HEALTH INSURANCE | Admitting: Internal Medicine

## 2013-02-22 ENCOUNTER — Telehealth: Payer: Self-pay | Admitting: *Deleted

## 2013-02-22 VITALS — BP 104/63 | HR 116 | Temp 97.7°F

## 2013-02-22 DIAGNOSIS — N390 Urinary tract infection, site not specified: Secondary | ICD-10-CM

## 2013-02-22 DIAGNOSIS — S81802D Unspecified open wound, left lower leg, subsequent encounter: Secondary | ICD-10-CM

## 2013-02-22 DIAGNOSIS — Z5189 Encounter for other specified aftercare: Secondary | ICD-10-CM

## 2013-02-22 NOTE — Progress Notes (Signed)
[facility] CLINIC NOTE  RFV: follow up for chronic ulcer Subjective:    Patient ID: [name], male    DOB: Apr 01, 1986, 27 y.o.   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 He has been off of oral antibiotics for several months and work with better local wound care. He was using pluronic (antibiotic) cream (formulated through [facility] pharmacy/wound care) daily instead of twice a day up until 3/11 when we referred him to [facility], where he had labs, imaging, and office based debridement. His wound cultures from last week grew PsA . He is doing daily (not BID) daikin like solution wet to dry dressing changes. Has not met with plastics yet.  He doesn't feel that his wounds are looking any better, still have exudate in each wound bed, maladorous plus he now sustained a new abrasion, small 2 x 4 cm on left anterior shin. The lateral distal wound appears slightly bigger, but may be due to debridement. He is reporting low grade fever like symptoms over the last few days, some erythema to his leg   Today they are measuring:  - 4 x 9 cm, 3 x 6 cm lateral lesions. Lateral eschar on distal lesion - New 2 x 4 cm abrasian left shin  Lab work at [facility] on 3/11, had WBC of 15 K, CRP 30.1, ESR 48, xray no osteo; wound cx PsA  Meds, social hx, family hx unchanged since last visit   Review of Systems ROS: Urinary symptoms for the past 5-7 days of frequent urination, leakage, maladorous     Objective:   Physical Exam BP 104/63  Pulse 116  Temp(Src) 97.7 F (36.5 C) (Oral) Physical Exam  Constitutional: He is oriented to person, place, and time. He appears well-developed and well-nourished. No distress.  HENT:  Mouth/Throat: Oropharynx is clear and moist. No oropharyngeal exudate.  Cardiovascular: Normal rate, regular rhythm  and normal heart sounds. Exam reveals no gallop and no friction rub.  No murmur heard.  Pulmonary/Chest: Effort normal and breath sounds normal. No respiratory distress. He has no wheezes.  Abdominal: Soft. Bowel sounds are normal. He exhibits no distension. There is no tenderness.  Lymphadenopathy:  He has no cervical adenopathy.  Neurological: He is alert and oriented to person, place, and time.  Skin: Skin is warm and dry. His lateral wounds are measuring:  - 4 x 9 cm, 3 x 6 cm lateral lesions. Lateral eschar on distal lesion - New 2 x 4 cm abrasian left shin - lesion between 1st and 2nd toe heavy exudate, 2nd and 3rd toe macerated      Assessment & Plan:   pseudomonal deep tissue infection = have arranged for picc line to be placed on 4/2 at 8am, and have 1st dose of meropenem 1gm IV Q 8hr x 3 weeks. Patient is to see [facility] on 4/3 afternoon  Possible uti = will check ua and urine culture  rtc in 3 wk

## 2013-02-22 NOTE — Telephone Encounter (Signed)
Patient set up to have his PICC placed at [facility] 02/23/13 at 8 am after which he will have his first does of antibiotic at [facility]. Patient is aware of this visit and where to go in the hospital. Also faxed information to [facility] and [facility] who will be doing the dressing changes and giving him the medication. [facility] is also aware of this.

## 2013-02-23 ENCOUNTER — Encounter (HOSPITAL_COMMUNITY)
Admission: RE | Admit: 2013-02-23 | Discharge: 2013-02-23 | Disposition: A | Discharge status: Home / Self Care | Payer: PRIVATE HEALTH INSURANCE | Source: Ambulatory Visit | Attending: Internal Medicine | Admitting: Internal Medicine

## 2013-02-23 ENCOUNTER — Other Ambulatory Visit: Payer: Self-pay | Admitting: Internal Medicine

## 2013-02-23 ENCOUNTER — Ambulatory Visit (HOSPITAL_COMMUNITY)
Admission: RE | Admit: 2013-02-23 | Discharge: 2013-02-23 | Disposition: A | Discharge status: Home / Self Care | Payer: PRIVATE HEALTH INSURANCE | Source: Ambulatory Visit | Attending: Internal Medicine | Admitting: Internal Medicine

## 2013-02-23 DIAGNOSIS — L03119 Cellulitis of unspecified part of limb: Secondary | ICD-10-CM | POA: Insufficient documentation

## 2013-02-23 DIAGNOSIS — S81802D Unspecified open wound, left lower leg, subsequent encounter: Secondary | ICD-10-CM

## 2013-02-23 DIAGNOSIS — L8990 Pressure ulcer of unspecified site, unspecified stage: Secondary | ICD-10-CM | POA: Insufficient documentation

## 2013-02-23 DIAGNOSIS — L02419 Cutaneous abscess of limb, unspecified: Secondary | ICD-10-CM | POA: Insufficient documentation

## 2013-02-23 DIAGNOSIS — L89899 Pressure ulcer of other site, unspecified stage: Secondary | ICD-10-CM | POA: Insufficient documentation

## 2013-02-23 LAB — URINALYSIS, ROUTINE W REFLEX MICROSCOPIC
Bilirubin Urine: NEGATIVE
Glucose, UA: NEGATIVE mg/dL
Ketones, ur: NEGATIVE mg/dL
Nitrite: NEGATIVE
Protein, ur: NEGATIVE mg/dL
Specific Gravity, Urine: 1.008 (ref 1.005–1.030)
Urobilinogen, UA: 0.2 mg/dL (ref 0.0–1.0)
pH: 5.5 (ref 5.0–8.0)

## 2013-02-23 LAB — URINALYSIS, MICROSCOPIC ONLY
Casts: NONE SEEN
Crystals: NONE SEEN
Squamous Epithelial / LPF: NONE SEEN
WBC, UA: >50 WBC/hpf — AB (ref <3)

## 2013-02-23 MED ORDER — HEPARIN SOD (PORK) LOCK FLUSH 100 UNIT/ML IV SOLN
250.0000 [IU] | Freq: Once | INTRAVENOUS | Status: AC
  Administered 2013-02-23: 250 [IU] via INTRAVENOUS

## 2013-02-23 MED ORDER — HEPARIN SOD (PORK) LOCK FLUSH 100 UNIT/ML IV SOLN
INTRAVENOUS | Status: AC
  Filled 2013-02-23: qty 5

## 2013-02-23 MED ORDER — SODIUM CHLORIDE 0.9 % IV SOLN
1.0000 g | Freq: Once | INTRAVENOUS | Status: AC
  Administered 2013-02-23: 1 g via INTRAVENOUS
  Filled 2013-02-23: qty 1

## 2013-02-23 NOTE — Procedures (Signed)
R arm PICC No complication No blood loss. See complete dictation in [facility].  

## 2013-02-25 LAB — URINE CULTURE: Colony Count: >=100000

## 2013-02-27 ENCOUNTER — Telehealth: Payer: Self-pay | Admitting: Licensed Clinical Social Worker

## 2013-02-27 ENCOUNTER — Telehealth: Payer: Self-pay | Admitting: *Deleted

## 2013-02-27 ENCOUNTER — Encounter: Payer: Self-pay | Admitting: *Deleted

## 2013-02-27 DIAGNOSIS — Z881 Allergy status to other antibiotic agents status: Secondary | ICD-10-CM | POA: Insufficient documentation

## 2013-02-27 NOTE — Telephone Encounter (Signed)
Per Dr Drue Second called patient and advised him to stop his meropenem. Advised him that we have sent orders to [facility] for them to change his IV antibiotic to Ceftazidime, redress his PICC site and draw a CBC w/Diff. Also advised him if he has any shortness of breath,chest pains or throat swelling to call 911 immediately. Patient voiced that he understands and that he is not alone today and feels comfortable at this time.  Faxed new orders to [facility] at [phone] and received confirmation.

## 2013-02-27 NOTE — Telephone Encounter (Signed)
Patient is on IV Meropenem and started having an itchy, red body rash since Sunday evening that has gotten consistently worse with each infusion. Last infusion was at 3:00 am this morning. Patient denies shortness of breath, or problems swallowing. Please advise.

## 2013-02-27 NOTE — Telephone Encounter (Signed)
We have arranged for him to stop meropenem due to rash, and switch to ceftazadime. Start benadryl and have [facility] change dressing today. And avoid using chlorhexadine

## 2013-02-28 ENCOUNTER — Telehealth: Payer: Self-pay | Admitting: *Deleted

## 2013-02-28 NOTE — Telephone Encounter (Signed)
i spoke to [name] this evening and saw a picture of his picc line site. i have asked him to stop using it. It looks like contact dermatitis from chloropre or the dressing that has worsened since Monday night. We will need to get him a new line on opposite arm for 4/10 if possible. Also, please have him come to clinic on thrusday as well so that we can document extent of rash

## 2013-02-28 NOTE — Telephone Encounter (Signed)
[name] with [facility] called and advised he went to see the patient yesterday and change his PICC dressing. He advised that the patient has visible swelling, weeping and rash at the PICC site. He advised not pain but very swollen and red. He advised that he had no problem with the new medication but wanted to make Dr Drue Second aware to the area as it caused him concern. Advised his call back number is [phone] if Dr Drue Second needs to speak with him. Advise [name] will inform Dr Drue Second of his observations.

## 2013-03-01 ENCOUNTER — Telehealth: Payer: Self-pay | Admitting: *Deleted

## 2013-03-01 ENCOUNTER — Other Ambulatory Visit: Payer: Self-pay | Admitting: Internal Medicine

## 2013-03-01 ENCOUNTER — Ambulatory Visit (INDEPENDENT_AMBULATORY_CARE_PROVIDER_SITE_OTHER): Payer: PRIVATE HEALTH INSURANCE | Admitting: Internal Medicine

## 2013-03-01 ENCOUNTER — Ambulatory Visit (HOSPITAL_COMMUNITY)
Admission: RE | Admit: 2013-03-01 | Discharge: 2013-03-01 | Disposition: A | Discharge status: Home / Self Care | Payer: PRIVATE HEALTH INSURANCE | Source: Ambulatory Visit | Attending: Internal Medicine | Admitting: Internal Medicine

## 2013-03-01 DIAGNOSIS — L270 Generalized skin eruption due to drugs and medicaments taken internally: Secondary | ICD-10-CM

## 2013-03-01 DIAGNOSIS — B965 Pseudomonas (aeruginosa) (mallei) (pseudomallei) as the cause of diseases classified elsewhere: Secondary | ICD-10-CM

## 2013-03-01 DIAGNOSIS — R21 Rash and other nonspecific skin eruption: Secondary | ICD-10-CM

## 2013-03-01 DIAGNOSIS — L02619 Cutaneous abscess of unspecified foot: Secondary | ICD-10-CM | POA: Insufficient documentation

## 2013-03-01 DIAGNOSIS — L89899 Pressure ulcer of other site, unspecified stage: Secondary | ICD-10-CM

## 2013-03-01 DIAGNOSIS — A498 Other bacterial infections of unspecified site: Secondary | ICD-10-CM

## 2013-03-01 DIAGNOSIS — L03119 Cellulitis of unspecified part of limb: Secondary | ICD-10-CM | POA: Insufficient documentation

## 2013-03-01 DIAGNOSIS — L8990 Pressure ulcer of unspecified site, unspecified stage: Secondary | ICD-10-CM

## 2013-03-01 NOTE — Telephone Encounter (Signed)
This patient was taken care of during clinic 03/01/13 please see noted/tp

## 2013-03-01 NOTE — Procedures (Signed)
L arm SL PICC No complication No blood loss. See complete dictation in [facility].

## 2013-03-01 NOTE — Telephone Encounter (Signed)
Per Dr Drue Second called and had this patient set up for a PICC replacement. Called IR and and was advised to have Dr Drue Second put the order in and have the patient come to radiology and wait as they will work him in. She also advised the earlier the better. Called patient and advised him to go to IR radiology asap and that he is going to be a work in therefore he will have to wait a so be prepared.

## 2013-03-02 NOTE — Progress Notes (Signed)
[facility] CLINIC NOTE  RFV: worsening rash at picc line since 3 days ago Subjective:    Patient ID: [name], male    DOB: [birth year], 27 y.o.   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 He had been seen at  [facility] in mid March, where he had labs, imaging, and office based debridement occurred. He had WBC of 15 K, CRP 30.1, ESR 48, xray no osteo;  His wound cultures from late March/early April grew Pseudomonas Aeruginosa . He is doing daily (not BID) daikin like solution wet to dry dressing changes. Has not met with plastics yet.   He has had a complicated course in the last week since we started Iv antibiotics on 4/4 due to rash from meropenem as well as contact dermatitis to dressing vs. Chlorohexadine. He was last seen on 4/3 where his wounds , still have exudate in each wound bed, maladorous plus he now sustained a new abrasion, small 2 x 4 cm on left anterior shin. The lateral distal wound appears slightly bigger, but may be due to debridement. He also had associated symptoms of  low grade fever with some erythema to his leg. His wound measurements on 4/3 were : - 4 x 9 cm, 3 x 6 cm lateral lesions. Lateral eschar on distal lesion  - New 2 x 4 cm abrasian left shin - wound between 1 & 2nd toe communicating dorsum and plantar aspect were unchanged.  He also reported urinary symptoms c/w UTI. Ur cx identified pan sensitive klebsiella oxytoca.  We arranged for him to get PICC line to right arm and start meropenem on 02/23/13. By 02/26/13, he reported rash to arms and torso but also noticed to have mild redness at insertion site of picc line, it appeared consistent with contact dermatitis, possibly from chlorohexadine. Photos were taken by patient. We arranged to discontinue meropenem and change to ceftaz  which would still treat his pseudomonas isolate. Asked home health to change dressing, however, they used chlorohexadine again against our advice. Patient noticed rash under dressing of picc line much worse over the course of next 2 days. He had significant raised erythematous rash well delineated under dressing to picc line. He states that rash on arms was ok but rash to torso still progressed. He states it is itchy, but only started to use benadryl x 1 dose the night prior. He does report wound looks slightly better since initiation of antibiotics. Less maladorous.  We arranged on 4/10 to have picc line pulled from right arm and moved to left arm.  Meds, social hx, family hx unchanged since last visit     Review of Systems ROS: Urinary symptoms that previously was problematic has improved back to baseline     Objective:   Physical Exam There were no vitals taken for this visit. Physical Exam  Constitutional:  oriented to person, place, and time. He appears well-developed and well-nourished. No distress.  HENT:  Mouth/Throat: Oropharynx is clear and moist. No oropharyngeal exudate.  Cardiovascular: Normal rate, regular rhythm and normal heart sounds. Exam reveals no gallop and no friction rub.  No murmur heard.  Pulmonary/Chest: Effort normal and breath sounds normal. No respiratory distress. He has no wheezes.  Torso = diffuse red scattered papules mostly right abdominal  wall along right anterior axillary line. Skin:Ext: right upper arm has square patch of raised erythema plaque with scattered vesicle where original picc line was in place. Also has scattered lesions in axilla bilaterally. The left upper arm with new picc line. Has scattered lesions near axilla. Lymphadenopathy:  no cervical adenopathy.  Neurological: lower extremity flaccid/spasticity ext: left leg wrapped today.        Assessment & Plan:   drug allergy rash = patient has pruritic rash to torso and arms from  meropenem. It appears still present on exam which is expected. It will likely resolve over the next 10 days. If it worsens over the next 3 days then there is a chance that he is also allergic to ceftazadime. He has multiple drug allergies which makes it challenging for treatment of his pseudomonal infection. I have asked patient to take benadryl Q6-8hrs to improve symptom. He will check back with [place] in 2-3days and send photos to see if it is worsening/improving.  Contact dermatitis of right arm = markedly worse in the last 2-3 days probably due to chlorohexadine. I have asked the home health agency not to use due to concern of it happening to his left arm. If he notices redness in his left arm picc site, he is to call immediately. If he has chills and fevers, to  Call us.  Deep wound infection with PsA= he is following up with plastics at [facility] on 4/11  kleb uti = treated with current IV antibiotic regimen, symptoms resolved.  rtc in 7-10 days

## 2013-03-12 ENCOUNTER — Encounter: Payer: Self-pay | Admitting: *Deleted

## 2013-03-12 ENCOUNTER — Telehealth: Payer: Self-pay | Admitting: *Deleted

## 2013-03-12 NOTE — Telephone Encounter (Signed)
Thanks for giving advice to [facility]. I am concerned that they don't know what to do if CHG allergy is encountered. I will bring this up with someone from their management

## 2013-03-12 NOTE — Telephone Encounter (Signed)
Tresa Endo, [facility] RN, called asking what our policy is when CHG is contraindicated for PICC dressing changes.  Per Nursing Guidelines listed on [facility] resources, if CHG is contraindicated, 70% alcohol is preferred.  Tresa Endo voiced understanding. Andree Coss, RN

## 2013-03-20 ENCOUNTER — Telehealth: Payer: Self-pay | Admitting: *Deleted

## 2013-03-20 NOTE — Telephone Encounter (Signed)
[facility] calling to find out when PICC should be pulled.  IV abx stopped on 03/15/13.  MD please advise.  NEXT OV 03/22/13 w/ Dr. Drue Second.

## 2013-03-20 NOTE — Telephone Encounter (Signed)
Can have [facility] pull out picc tomorrow or we can pull it on thur appt

## 2013-03-22 ENCOUNTER — Encounter: Payer: Self-pay | Admitting: Internal Medicine

## 2013-03-22 ENCOUNTER — Ambulatory Visit (INDEPENDENT_AMBULATORY_CARE_PROVIDER_SITE_OTHER): Payer: PRIVATE HEALTH INSURANCE | Admitting: Internal Medicine

## 2013-03-22 VITALS — BP 124/73 | HR 85 | Temp 97.8°F

## 2013-03-22 DIAGNOSIS — R300 Dysuria: Secondary | ICD-10-CM

## 2013-03-22 DIAGNOSIS — L89899 Pressure ulcer of other site, unspecified stage: Secondary | ICD-10-CM

## 2013-03-22 DIAGNOSIS — L8990 Pressure ulcer of unspecified site, unspecified stage: Secondary | ICD-10-CM

## 2013-03-22 NOTE — Progress Notes (Signed)
Per Dr Drue Second PICC removal.  PICC site unremarkable with sutures intact.  48 cm PICC and two sutures removed from patient's left arm. Area cleansed and petroleum guaze dressing placed to site. Pt advised not to removed dressing for 24 hours, if this dressing becomes soaked with blood to call our office and no heavy lifting with this arm.  Pt tolerated the procedure well.    Laurell Josephs, RN

## 2013-03-23 LAB — URINALYSIS, ROUTINE W REFLEX MICROSCOPIC
Bilirubin Urine: NEGATIVE
Glucose, UA: NEGATIVE mg/dL
Hgb urine dipstick: NEGATIVE
Ketones, ur: NEGATIVE mg/dL
Leukocytes, UA: NEGATIVE
Nitrite: NEGATIVE
Protein, ur: 30 mg/dL — AB
Specific Gravity, Urine: 1.012 (ref 1.005–1.030)
Urobilinogen, UA: 0.2 mg/dL (ref 0.0–1.0)
pH: 7 (ref 5.0–8.0)

## 2013-03-23 LAB — URINALYSIS, MICROSCOPIC ONLY
Bacteria, UA: NONE SEEN
Casts: NONE SEEN
Crystals: NONE SEEN
Squamous Epithelial / LPF: NONE SEEN

## 2013-03-26 LAB — URINE CULTURE: Colony Count: 50000

## 2013-04-16 NOTE — Progress Notes (Signed)
[facility] CLINIC NOTE  RFV: chronic ulcers of lower extremity Subjective:    Patient ID: [name], male    DOB: 05-10-1986, 27 y.o.   MRN: [id number]  HPI [name] is a 27yo Male with SCI and has had chronic ulcerations and poor wound healing of right foot and lateral aspect superior to lateral malleolus. He was recently treated with 14 days of imipenem due to PsA skin/wound infection. He finished his antibiotic course yesterday, to have picc line removed in today's clinic. He did develop macularpapular rash to carbapenem which he took benadryl and resolved once antibiotics stopped. No associated anaphalyxis  He is also being seen by wound clinic at [facility] who are evaluating him for skin grafts and better wound care management than he previously was receiving. Vascular studies showed venous congestion at area of lateral wound that possibly contributed to poor wound healing per discussion with his treatment team  Current Outpatient Prescriptions on File Prior to Visit  Medication Sig Dispense Refill  . baclofen (LIORESAL) 10 MG tablet Take 10 mg by mouth 3 (three) times daily.       . citalopram (CELEXA) 40 MG tablet Take 40 mg by mouth at bedtime.       . diazepam (VALIUM) 10 MG tablet Take 10 mg by mouth at bedtime.       . fexofenadine (ALLEGRA) 180 MG tablet Take 180 mg by mouth at bedtime.       . hydrochlorothiazide (HYDRODIURIL) 25 MG tablet Take 25 mg by mouth daily.      Marland Kitchen imipramine (TOFRANIL) 50 MG tablet Take 50 mg by mouth at bedtime.       . Multiple Vitamins-Minerals (MULTIVITAMINS THER. W/MINERALS) TABS Take 1 tablet by mouth at bedtime.       Marland Kitchen oxybutynin (DITROPAN XL) 15 MG 24 hr tablet Take 30 mg by mouth at bedtime.        No current facility-administered medications on file prior to visit.   Active Ambulatory Problems    Diagnosis Date Noted  . Decubitus ulcer of left leg 04/27/2012  . 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 History of DVT (deep vein  thrombosis) 05/01/2012  . 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 Leg fracture, right 05/01/2012  . Lower paraplegia   . Spinal cord injury of T10 vertebra   . History of DVT of lower extremity   . Leukocytosis 06/26/2012  . Infected decubitus ulcer 06/26/2012  . Allergy to meropenem 02/27/2013   Resolved Ambulatory Problems    Diagnosis Date Noted  . No Resolved Ambulatory Problems   Past Medical History  Diagnosis Date  . Seasonal allergies   . Mild acid reflux WATCHES DIET  . Urge incontinence   . Spastic neurogenic bladder   . Self-catheterizes urinary bladder 4 TO 6 TIMES DAILY AND PRN  . Leg ulcer, left   . OSA on CPAP MODERATE  . Wheelchair bound     Social and family hx unchanged   Review of Systems Review of Systems  Constitutional: Negative for fever, chills, diaphoresis, activity change, appetite change, fatigue and unexpected weight change.  HENT: Negative for congestion, sore throat, rhinorrhea, sneezing, trouble swallowing and sinus pressure.  Eyes: Negative for photophobia and visual disturbance.  Respiratory: Negative for cough, chest tightness, shortness of breath, wheezing and stridor.  Cardiovascular: Negative for chest pain, palpitations and leg swelling.  Gastrointestinal: Negative for nausea, vomiting, abdominal pain, diarrhea, constipation, blood in stool, abdominal distention and anal bleeding.  Genitourinary:  difficulty urinating.  Musculoskeletal: Negative for myalgias, back pain, joint swelling, arthralgias and gait problem.  Skin: Negative for color change, pallor, rash and wound.  Neurological: Negative for dizziness, tremors, weakness and light-headedness.  Hematological: Negative for adenopathy. Does not bruise/bleed easily.  Psychiatric/Behavioral: Negative for behavioral problems, confusion, sleep disturbance, dysphoric mood, decreased concentration and agitation.       Objective:   Physical Exam BP 124/73  Pulse 85  Temp(Src) 97.8 F  (36.6 C) (Oral) Physical Exam  Constitutional: He is oriented to person, place, and time. He appears well-developed and well-nourished. No distress.  HENT:  Mouth/Throat: Oropharynx is clear and moist. No oropharyngeal exudate.  Cardiovascular: Normal rate, regular rhythm and normal heart sounds. Exam reveals no gallop and no friction rub.  No murmur heard.  Pulmonary/Chest: Effort normal and breath sounds normal. No respiratory distress. He has no wheezes.  Abdominal: Soft. Bowel sounds are normal. He exhibits no distension. There is no tenderness.  Lymphadenopathy:  He has no cervical adenopathy.  Neurological: He is alert and oriented to person, place, and time.  Skin: pruritc drug rash nearly all resolved. His chronic wounds to right legs are roughly same size but less exudate     Assessment & Plan:  Pseudomonal infection of chronic foot ulcers = finished treatment course of cellulitis/deep tissue infection. Will pull picc line at this visit. Defer to [facility] for local management of wound healing.  Dysuria = will check ua to see if patient requires treatment for uti  rtc 4 wk

## 2013-04-19 ENCOUNTER — Encounter: Payer: Self-pay | Admitting: Internal Medicine

## 2013-04-19 ENCOUNTER — Ambulatory Visit (INDEPENDENT_AMBULATORY_CARE_PROVIDER_SITE_OTHER): Payer: PRIVATE HEALTH INSURANCE | Admitting: Internal Medicine

## 2013-04-19 DIAGNOSIS — L97309 Non-pressure chronic ulcer of unspecified ankle with unspecified severity: Secondary | ICD-10-CM

## 2013-04-19 LAB — CBC WITH DIFFERENTIAL/PLATELET
Basophils Absolute: 0.1 10*3/uL (ref 0.0–0.1)
Basophils Relative: 1 % (ref 0–1)
Eosinophils Absolute: 0.5 10*3/uL (ref 0.0–0.7)
Eosinophils Relative: 4 % (ref 0–5)
HCT: 36 % — ABNORMAL LOW (ref 39.0–52.0)
Hemoglobin: 12.1 g/dL — ABNORMAL LOW (ref 13.0–17.0)
Lymphocytes Relative: 26 % (ref 12–46)
Lymphs Abs: 3.6 10*3/uL (ref 0.7–4.0)
MCH: 25.8 pg — ABNORMAL LOW (ref 26.0–34.0)
MCHC: 33.6 g/dL (ref 30.0–36.0)
MCV: 76.8 fL — ABNORMAL LOW (ref 78.0–100.0)
Monocytes Absolute: 1.9 10*3/uL — ABNORMAL HIGH (ref 0.1–1.0)
Monocytes Relative: 14 % — ABNORMAL HIGH (ref 3–12)
Neutro Abs: 7.5 10*3/uL (ref 1.7–7.7)
Neutrophils Relative %: 55 % (ref 43–77)
Platelets: 565 10*3/uL — ABNORMAL HIGH (ref 150–400)
RBC: 4.69 MIL/uL (ref 4.22–5.81)
RDW: 15.3 % (ref 11.5–15.5)
WBC: 13.5 10*3/uL — ABNORMAL HIGH (ref 4.0–10.5)

## 2013-04-19 LAB — SEDIMENTATION RATE: Sed Rate: 34 mm/hr — ABNORMAL HIGH (ref 0–16)

## 2013-04-19 MED ORDER — DOXYCYCLINE HYCLATE 100 MG PO TABS: 100.0000 mg | ORAL_TABLET | Freq: Two times a day (BID) | ORAL | Status: DC

## 2013-04-20 NOTE — Progress Notes (Signed)
[facility] CLINIC NOTE  RFV: follow up on chronic wound of left leg Subjective:    Patient ID: [name], male    DOB: December 20, 1985, 27 y.o.   MRN: [id number]  HPI [name] is 26yo Male who has SCI of T10 injury with lower extremity paraplegia. He has had chronic left leg ulcers to based of foot between first and 2nd toe as well as two large lesions to left lateral malleolus areas and left lateral calf. He has finished a 14 day course of imipenem for pseudomonal wound infection roughly 4 wks ago. He had picc line removed with anticipation of getting skin graft. He is scheduled for skin graft surgery next week and continues to be followed at the [facility]. In the past week he has noticed that his wound has had a stronger smell still has significant drainage from wounds. He states that it is about the same as it was last week at his wound care visit.  He denies fever, chills, nightsweats, nor any dysuria (which he had at the last few visits) important to note that the patient did have diffuse macular papular rash to ceftaz and subsequently switched to imipenem. His rashed persisted but it was difficlut to tell if was exacerbated by imipenem.  Today his wounds have a significant order to them with moderate exudate. His wound bed near his left lateral malleolus appears deeper than 4 wks ago, not palpable to the bone or visible bone.  Current Outpatient Prescriptions on File Prior to Visit  Medication Sig Dispense Refill  . baclofen (LIORESAL) 10 MG tablet Take 10 mg by mouth 3 (three) times daily.       . citalopram (CELEXA) 40 MG tablet Take 40 mg by mouth at bedtime.       . diazepam (VALIUM) 10 MG tablet Take 10 mg by mouth at bedtime.       . fexofenadine (ALLEGRA) 180 MG tablet Take 180 mg by mouth at bedtime.       . hydrochlorothiazide (HYDRODIURIL) 25 MG tablet Take 25 mg by mouth daily.      Marland Kitchen imipramine (TOFRANIL) 50 MG tablet Take 50 mg by mouth at bedtime.       . Multiple  Vitamins-Minerals (MULTIVITAMINS THER. W/MINERALS) TABS Take 1 tablet by mouth at bedtime.       Marland Kitchen oxybutynin (DITROPAN XL) 15 MG 24 hr tablet Take 30 mg by mouth at bedtime.        No current facility-administered medications on file prior to visit.   Allergies  Allergen Reactions  . Sulfa Antibiotics Hives  . Levofloxacin Rash  . Penicillins Rash  . Zyvox (Linezolid) Rash   Active Ambulatory Problems    Diagnosis Date Noted  . Decubitus ulcer of left leg 04/27/2012  . 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 History of DVT (deep vein thrombosis) 05/01/2012  . 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 Leg fracture, right 05/01/2012  . Lower paraplegia   . Spinal cord injury of T10 vertebra   . History of DVT of lower extremity   . Leukocytosis 06/26/2012  . Infected decubitus ulcer 06/26/2012  . Allergy to meropenem 02/27/2013   Resolved Ambulatory Problems    Diagnosis Date Noted  . No Resolved Ambulatory Problems   Past Medical History  Diagnosis Date  . Seasonal allergies   . Mild acid reflux WATCHES DIET  . Urge incontinence   . Spastic neurogenic bladder   . Self-catheterizes urinary bladder 4  TO 6 TIMES DAILY AND PRN  . Leg ulcer, left   . OSA on CPAP MODERATE  . Wheelchair bound    Social and family hx are unchanged since last visit    Review of Systems 12 point ros reviewed are negative except what is mentioned with ongoing wounds    Objective:   Physical Exam BP 146/94  Pulse 106  Temp(Src) 98.4 F (36.9 C) (Oral)  Physical Exam  Constitutional: He is oriented to person, place, and time. He appears well-developed and well-nourished. No distress in his wheelchair Neurological: He is alert and oriented to person, place, and time. Motor 5/5in upper extremities bilaterally. Flaccid on lower extremities with uncontrolled spasm/jerks when manipulating left leg. Skin: left leg lesions appear slightly worse with heavy exudate, smell, specifically  the left ankle lesion Psychiatric: He has a normal mood and affect. His behavior is normal.   .      Assessment & Plan:  Deep tissue infection = hesitated to do superficial wound swab since i am not sure if it will also tell us what is deep into his space. I will coordinate with [facility] to see if they can do a tissue biopsy to send for aerobic culure but I also think that AFB cultures would be worth it as a cause of this chronic ulcer. I started empiric doxycycline 100mg  BID and await culture to see if psa is still part of the picture. Past psa isolate was Intermediate to cipro, resistant to all except carbapenems.  Will defer the decision to do skin graft now versus later to plastic surgery.  Will send wound swab for afb and aerobic culture.  rtc  2-4 wk

## 2013-04-22 LAB — WOUND CULTURE
Gram Stain: NONE SEEN
Gram Stain: NONE SEEN
Gram Stain: NONE SEEN

## 2013-05-24 ENCOUNTER — Encounter: Payer: Self-pay | Admitting: Internal Medicine

## 2013-05-24 ENCOUNTER — Ambulatory Visit (INDEPENDENT_AMBULATORY_CARE_PROVIDER_SITE_OTHER): Payer: PRIVATE HEALTH INSURANCE | Admitting: Internal Medicine

## 2013-05-24 DIAGNOSIS — L97209 Non-pressure chronic ulcer of unspecified calf with unspecified severity: Secondary | ICD-10-CM
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
## 2013-06-01 LAB — AFB CULTURE WITH SMEAR (NOT AT ARMC): Acid Fast Smear: NONE SEEN

## 2013-07-16 ENCOUNTER — Ambulatory Visit (INDEPENDENT_AMBULATORY_CARE_PROVIDER_SITE_OTHER): Payer: PRIVATE HEALTH INSURANCE | Admitting: Infectious Diseases

## 2013-07-16 ENCOUNTER — Inpatient Hospital Stay (HOSPITAL_COMMUNITY)
Admission: EM | Admit: 2013-07-16 | Discharge: 2013-07-21 | DRG: 872 | Disposition: A | Discharge status: Home Health | Payer: PRIVATE HEALTH INSURANCE | Attending: Internal Medicine | Admitting: Internal Medicine

## 2013-07-16 ENCOUNTER — Encounter (HOSPITAL_COMMUNITY): Payer: Self-pay | Admitting: Emergency Medicine

## 2013-07-16 ENCOUNTER — Encounter: Payer: Self-pay | Admitting: Infectious Diseases

## 2013-07-16 VITALS — BP 100/66 | HR 139 | Temp 102.7°F | Ht 70.0 in | Wt 260.0 lb

## 2013-07-16 DIAGNOSIS — Z87891 Personal history of nicotine dependence: Secondary | ICD-10-CM

## 2013-07-16 DIAGNOSIS — K219 Gastro-esophageal reflux disease without esophagitis: Secondary | ICD-10-CM | POA: Diagnosis present

## 2013-07-16 DIAGNOSIS — D72829 Elevated white blood cell count, unspecified: Secondary | ICD-10-CM

## 2013-07-16 DIAGNOSIS — G4733 Obstructive sleep apnea (adult) (pediatric): Secondary | ICD-10-CM | POA: Diagnosis present

## 2013-07-16 DIAGNOSIS — N319 Neuromuscular dysfunction of bladder, unspecified: Secondary | ICD-10-CM | POA: Diagnosis present

## 2013-07-16 DIAGNOSIS — Z86718 Personal history of other venous thrombosis and embolism: Secondary | ICD-10-CM

## 2013-07-16 DIAGNOSIS — A419 Sepsis, unspecified organism: Secondary | ICD-10-CM

## 2013-07-16 DIAGNOSIS — L0291 Cutaneous abscess, unspecified: Secondary | ICD-10-CM

## 2013-07-16 DIAGNOSIS — L97902 Non-pressure chronic ulcer of unspecified part of unspecified lower leg with fat layer exposed: Secondary | ICD-10-CM

## 2013-07-16 DIAGNOSIS — A498 Other bacterial infections of unspecified site: Secondary | ICD-10-CM

## 2013-07-16 DIAGNOSIS — L0390 Cellulitis, unspecified: Secondary | ICD-10-CM

## 2013-07-16 DIAGNOSIS — L03119 Cellulitis of unspecified part of limb: Secondary | ICD-10-CM | POA: Insufficient documentation

## 2013-07-16 DIAGNOSIS — S24103A Unspecified injury at T7-T10 level of thoracic spinal cord, initial encounter: Secondary | ICD-10-CM

## 2013-07-16 DIAGNOSIS — IMO0002 Reserved for concepts with insufficient information to code with codable children: Secondary | ICD-10-CM

## 2013-07-16 DIAGNOSIS — Z79899 Other long term (current) drug therapy: Secondary | ICD-10-CM

## 2013-07-16 DIAGNOSIS — B9562 Methicillin resistant Staphylococcus aureus infection as the cause of diseases classified elsewhere: Secondary | ICD-10-CM

## 2013-07-16 DIAGNOSIS — Z9081 Acquired absence of spleen: Secondary | ICD-10-CM

## 2013-07-16 DIAGNOSIS — N179 Acute kidney failure, unspecified: Secondary | ICD-10-CM | POA: Diagnosis present

## 2013-07-16 DIAGNOSIS — S24103S Unspecified injury at T7-T10 level of thoracic spinal cord, sequela: Secondary | ICD-10-CM

## 2013-07-16 DIAGNOSIS — G8220 Paraplegia, unspecified: Secondary | ICD-10-CM | POA: Diagnosis present

## 2013-07-16 DIAGNOSIS — L97509 Non-pressure chronic ulcer of other part of unspecified foot with unspecified severity: Secondary | ICD-10-CM | POA: Diagnosis present

## 2013-07-16 DIAGNOSIS — Z993 Dependence on wheelchair: Secondary | ICD-10-CM

## 2013-07-16 DIAGNOSIS — L02419 Cutaneous abscess of limb, unspecified: Secondary | ICD-10-CM | POA: Diagnosis present

## 2013-07-16 LAB — CBC WITH DIFFERENTIAL/PLATELET
Basophils Absolute: 0 10*3/uL (ref 0.0–0.1)
Basophils Relative: 0 % (ref 0–1)
Eosinophils Absolute: 0 10*3/uL (ref 0.0–0.7)
Eosinophils Relative: 0 % (ref 0–5)
HCT: 35.6 % — ABNORMAL LOW (ref 39.0–52.0)
Hemoglobin: 12.1 g/dL — ABNORMAL LOW (ref 13.0–17.0)
Lymphocytes Relative: 16 % (ref 12–46)
Lymphs Abs: 3.3 10*3/uL (ref 0.7–4.0)
MCH: 26.4 pg (ref 26.0–34.0)
MCHC: 34 g/dL (ref 30.0–36.0)
MCV: 77.7 fL — ABNORMAL LOW (ref 78.0–100.0)
Monocytes Absolute: 3.5 10*3/uL — ABNORMAL HIGH (ref 0.1–1.0)
Monocytes Relative: 17 % — ABNORMAL HIGH (ref 3–12)
Neutro Abs: 13.9 10*3/uL — ABNORMAL HIGH (ref 1.7–7.7)
Neutrophils Relative %: 67 % (ref 43–77)
Platelets: 453 10*3/uL — ABNORMAL HIGH (ref 150–400)
RBC: 4.58 MIL/uL (ref 4.22–5.81)
RDW: 16.6 % — ABNORMAL HIGH (ref 11.5–15.5)
WBC: 20.7 10*3/uL — ABNORMAL HIGH (ref 4.0–10.5)

## 2013-07-16 LAB — URINE MICROSCOPIC-ADD ON

## 2013-07-16 LAB — COMPREHENSIVE METABOLIC PANEL
ALT: 101 U/L — ABNORMAL HIGH (ref 0–53)
AST: 99 U/L — ABNORMAL HIGH (ref 0–37)
Albumin: 3.2 g/dL — ABNORMAL LOW (ref 3.5–5.2)
Alkaline Phosphatase: 158 U/L — ABNORMAL HIGH (ref 39–117)
BUN: 17 mg/dL (ref 6–23)
CO2: 23 mEq/L (ref 19–32)
Calcium: 8.8 mg/dL (ref 8.4–10.5)
Chloride: 95 mEq/L — ABNORMAL LOW (ref 96–112)
Creatinine, Ser: 1.94 mg/dL — ABNORMAL HIGH (ref 0.50–1.35)
GFR calc Af Amer: 54 mL/min — ABNORMAL LOW (ref >90)
GFR calc non Af Amer: 47 mL/min — ABNORMAL LOW (ref >90)
Glucose, Bld: 107 mg/dL — ABNORMAL HIGH (ref 70–99)
Potassium: 3.4 mEq/L — ABNORMAL LOW (ref 3.5–5.1)
Sodium: 133 mEq/L — ABNORMAL LOW (ref 135–145)
Total Bilirubin: 1.4 mg/dL — ABNORMAL HIGH (ref 0.3–1.2)
Total Protein: 8.5 g/dL — ABNORMAL HIGH (ref 6.0–8.3)

## 2013-07-16 LAB — URINALYSIS, ROUTINE W REFLEX MICROSCOPIC
Glucose, UA: NEGATIVE mg/dL
Ketones, ur: 15 mg/dL — AB
Nitrite: NEGATIVE
Protein, ur: 100 mg/dL — AB
Specific Gravity, Urine: 1.018 (ref 1.005–1.030)
Urobilinogen, UA: 1 mg/dL (ref 0.0–1.0)
pH: 5.5 (ref 5.0–8.0)

## 2013-07-16 LAB — CG4 I-STAT (LACTIC ACID): Lactic Acid, Venous: 0.81 mmol/L (ref 0.5–2.2)

## 2013-07-16 LAB — MRSA PCR SCREENING: MRSA by PCR: POSITIVE — AB

## 2013-07-16 LAB — LACTIC ACID, PLASMA: Lactic Acid, Venous: 1.5 mmol/L (ref 0.5–2.2)

## 2013-07-16 MED ORDER — ACETAMINOPHEN 650 MG RE SUPP: 650.0000 mg | Freq: Four times a day (QID) | RECTAL | Status: DC | PRN

## 2013-07-16 MED ORDER — ACETAMINOPHEN 325 MG PO TABS: 650.0000 mg | ORAL_TABLET | Freq: Four times a day (QID) | ORAL | Status: DC | PRN

## 2013-07-16 MED ORDER — SODIUM CHLORIDE 0.9 % IV BOLUS (SEPSIS)
1000.0000 mL | Freq: Once | INTRAVENOUS | Status: AC
  Administered 2013-07-16: 1000 mL via INTRAVENOUS

## 2013-07-16 MED ORDER — PIPERACILLIN-TAZOBACTAM 3.375 G IVPB
3.3750 g | Freq: Three times a day (TID) | INTRAVENOUS | Status: DC
  Administered 2013-07-17 – 2013-07-19 (×8): 3.375 g via INTRAVENOUS
  Filled 2013-07-16 (×9): qty 50

## 2013-07-16 MED ORDER — BACLOFEN 10 MG PO TABS
10.0000 mg | ORAL_TABLET | Freq: Three times a day (TID) | ORAL | Status: DC
  Administered 2013-07-16 – 2013-07-21 (×14): 10 mg via ORAL
  Filled 2013-07-16 (×16): qty 1

## 2013-07-16 MED ORDER — OXYBUTYNIN CHLORIDE ER 15 MG PO TB24
30.0000 mg | ORAL_TABLET | Freq: Every day | ORAL | Status: DC
  Administered 2013-07-16 – 2013-07-20 (×5): 30 mg via ORAL
  Filled 2013-07-16 (×6): qty 2

## 2013-07-16 MED ORDER — SODIUM CHLORIDE 0.9 % IV SOLN
INTRAVENOUS | Status: DC
  Administered 2013-07-16 – 2013-07-18 (×5): via INTRAVENOUS

## 2013-07-16 MED ORDER — ONDANSETRON HCL 4 MG PO TABS: 4.0000 mg | ORAL_TABLET | Freq: Four times a day (QID) | ORAL | Status: DC | PRN

## 2013-07-16 MED ORDER — SODIUM CHLORIDE 0.9 % IJ SOLN
3.0000 mL | Freq: Two times a day (BID) | INTRAMUSCULAR | Status: DC
  Administered 2013-07-16 – 2013-07-20 (×5): 3 mL via INTRAVENOUS

## 2013-07-16 MED ORDER — CHLORHEXIDINE GLUCONATE CLOTH 2 % EX PADS
6.0000 | MEDICATED_PAD | Freq: Every day | CUTANEOUS | Status: AC
  Administered 2013-07-17 – 2013-07-21 (×5): 6 via TOPICAL

## 2013-07-16 MED ORDER — SODIUM CHLORIDE 0.9 % IV SOLN
Freq: Once | INTRAVENOUS | Status: AC
  Administered 2013-07-16: 18:00:00 via INTRAVENOUS

## 2013-07-16 MED ORDER — HEPARIN SODIUM (PORCINE) 5000 UNIT/ML IJ SOLN
5000.0000 [IU] | Freq: Three times a day (TID) | INTRAMUSCULAR | Status: DC
  Administered 2013-07-16 – 2013-07-21 (×14): 5000 [IU] via SUBCUTANEOUS
  Filled 2013-07-16 (×17): qty 1

## 2013-07-16 MED ORDER — IMIPRAMINE HCL 50 MG PO TABS
50.0000 mg | ORAL_TABLET | Freq: Every day | ORAL | Status: DC
  Administered 2013-07-16 – 2013-07-20 (×5): 50 mg via ORAL
  Filled 2013-07-16 (×7): qty 1

## 2013-07-16 MED ORDER — CITALOPRAM HYDROBROMIDE 20 MG PO TABS
20.0000 mg | ORAL_TABLET | Freq: Every day | ORAL | Status: DC
  Administered 2013-07-16 – 2013-07-20 (×5): 20 mg via ORAL
  Filled 2013-07-16 (×6): qty 1

## 2013-07-16 MED ORDER — OXYCODONE HCL 5 MG PO TABS: 5.0000 mg | ORAL_TABLET | ORAL | Status: DC | PRN

## 2013-07-16 MED ORDER — LORATADINE 10 MG PO TABS
10.0000 mg | ORAL_TABLET | Freq: Every day | ORAL | Status: DC
  Administered 2013-07-16 – 2013-07-21 (×6): 10 mg via ORAL
  Filled 2013-07-16 (×6): qty 1

## 2013-07-16 MED ORDER — SODIUM CHLORIDE 0.9 % IV SOLN
1500.0000 mg | INTRAVENOUS | Status: DC
  Administered 2013-07-16 – 2013-07-19 (×4): 1500 mg via INTRAVENOUS
  Filled 2013-07-16 (×6): qty 1500

## 2013-07-16 MED ORDER — SODIUM CHLORIDE 0.9 % IV BOLUS (SEPSIS)
2000.0000 mL | Freq: Once | INTRAVENOUS | Status: AC
  Administered 2013-07-16: 1000 mL via INTRAVENOUS

## 2013-07-16 MED ORDER — MORPHINE SULFATE 2 MG/ML IJ SOLN: 1.0000 mg | INTRAMUSCULAR | Status: DC | PRN

## 2013-07-16 MED ORDER — POTASSIUM CHLORIDE CRYS ER 20 MEQ PO TBCR
60.0000 meq | EXTENDED_RELEASE_TABLET | Freq: Once | ORAL | Status: AC
  Administered 2013-07-16: 60 meq via ORAL
  Filled 2013-07-16: qty 3

## 2013-07-16 MED ORDER — VANCOMYCIN HCL IN DEXTROSE 1-5 GM/200ML-% IV SOLN
1000.0000 mg | Freq: Once | INTRAVENOUS | Status: AC
  Administered 2013-07-16: 1000 mg via INTRAVENOUS
  Filled 2013-07-16: qty 200

## 2013-07-16 MED ORDER — ONDANSETRON HCL 4 MG/2ML IJ SOLN: 4.0000 mg | Freq: Four times a day (QID) | INTRAMUSCULAR | Status: DC | PRN

## 2013-07-16 MED ORDER — MUPIROCIN 2 % EX OINT
1.0000 "application " | TOPICAL_OINTMENT | Freq: Two times a day (BID) | CUTANEOUS | Status: AC
  Administered 2013-07-17 – 2013-07-21 (×10): 1 via NASAL
  Filled 2013-07-16 (×2): qty 22

## 2013-07-16 MED ORDER — PIPERACILLIN-TAZOBACTAM 3.375 G IVPB
3.3750 g | Freq: Once | INTRAVENOUS | Status: AC
  Administered 2013-07-16: 3.375 g via INTRAVENOUS
  Filled 2013-07-16: qty 50
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
## 2013-07-16 NOTE — Assessment & Plan Note (Signed)
He has a severe cellulitis of his BLE as well as sepsis. Will send him to hospital to be admitted. Would consdier starting him on vanco, cefepime. Would consider surgical eval, MRI of his LE. Needs aggressive hydration.

## 2013-07-16 NOTE — Progress Notes (Signed)
  Subjective:    Patient ID: [name], male    DOB: [birth year], 27 y.o.   MRN: [id number]  HPI 26yo W M with paraplegia from T10 spinal cord injury (07-10-04) has had long standing chronic non-healing ulcer to dorsum/plantar aspect of foot by 1st & 2nd toe as well as lateral calf of left leg.  Self bladder caths as well.  He underwent plastic surgery of this area earlier this year (June 2014) at [facility] with significant improvment (was d/c from ID clinic July 2014). Also got hyperbaric O2 at [facility].   Over last 48 h has had worsening swelling, heat, erythema, fever (not measured at home). Had dressing changed by [facility]OC on 07-12-13. Had increased d/c from wounds (yellowish).   ALL-  sulfa: rash Levaquin: rash Meropenem: rash (severe) Zyvox: rash   Review of Systems  Constitutional: Positive for fever, chills and appetite change.  Respiratory: Negative for cough and shortness of breath.   Gastrointestinal: Positive for nausea. Negative for diarrhea and constipation.  Genitourinary:       Self caths, urine is darker than nl       Objective:   Physical Exam  Constitutional: He appears well-developed and well-nourished.  HENT:  Mouth/Throat: No oropharyngeal exudate.  Eyes: EOM are normal. Pupils are equal, round, and reactive to light.  Neck: Neck supple.  Cardiovascular: Tachycardia present.   Pulmonary/Chest: Effort normal and breath sounds normal. No respiratory distress. He has no wheezes.  Abdominal: Soft. Bowel sounds are normal. He exhibits no distension. There is no tenderness.  Lymphadenopathy:    He has no cervical adenopathy.  Skin:             Assessment & Plan:

## 2013-07-16 NOTE — Assessment & Plan Note (Addendum)
Fever, tachycardia, hypotension.  Please check BCx, could consider checking his urine as well however feel that he has a clear source from his legs.  Will send him to ED, discussed with hospitalist. Pt asked to go home prior to admit, i did not feel he was stable enough.

## 2013-07-16 NOTE — ED Notes (Signed)
Patient self caths. Requests to do cath on his own for urine sample. Self cath kit given patient requests 15f. Able to self cath aseptically. Urine obtained and sent to lab. Approximately 20 ml clear dark urine obtained from cath.
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
## 2013-07-16 NOTE — ED Notes (Addendum)
Pt from ID office and sent to er for IV fluids for ? Infection in both legs has been running fevers areas between first and 2 nd toe  Back of rt  And left calf. Area on left worse than rt

## 2013-07-17 DIAGNOSIS — Z9089 Acquired absence of other organs: Secondary | ICD-10-CM

## 2013-07-17 DIAGNOSIS — L0291 Cutaneous abscess, unspecified: Secondary | ICD-10-CM

## 2013-07-17 DIAGNOSIS — L0390 Cellulitis, unspecified: Secondary | ICD-10-CM

## 2013-07-17 LAB — BASIC METABOLIC PANEL
BUN: 19 mg/dL (ref 6–23)
CO2: 22 mEq/L (ref 19–32)
Calcium: 7.6 mg/dL — ABNORMAL LOW (ref 8.4–10.5)
Chloride: 105 mEq/L (ref 96–112)
Creatinine, Ser: 1.81 mg/dL — ABNORMAL HIGH (ref 0.50–1.35)
GFR calc Af Amer: 59 mL/min — ABNORMAL LOW (ref >90)
GFR calc non Af Amer: 51 mL/min — ABNORMAL LOW (ref >90)
Glucose, Bld: 156 mg/dL — ABNORMAL HIGH (ref 70–99)
Potassium: 3.5 mEq/L (ref 3.5–5.1)
Sodium: 137 mEq/L (ref 135–145)

## 2013-07-17 LAB — CBC
HCT: 28.9 % — ABNORMAL LOW (ref 39.0–52.0)
Hemoglobin: 9.8 g/dL — ABNORMAL LOW (ref 13.0–17.0)
MCH: 26.6 pg (ref 26.0–34.0)
MCHC: 33.9 g/dL (ref 30.0–36.0)
MCV: 78.5 fL (ref 78.0–100.0)
Platelets: 406 10*3/uL — ABNORMAL HIGH (ref 150–400)
RBC: 3.68 MIL/uL — ABNORMAL LOW (ref 4.22–5.81)
RDW: 17 % — ABNORMAL HIGH (ref 11.5–15.5)
WBC: 16.5 10*3/uL — ABNORMAL HIGH (ref 4.0–10.5)

## 2013-07-17 LAB — TSH: TSH: 1.638 u[IU]/mL (ref 0.350–4.500)

## 2013-07-17 NOTE — Consult Note (Addendum)
WOC consult Note Reason for Consult:Consult requested for bilat legs.  Pt has been followed by the outpatient wound care center at [facility] in the past for hyperbaric oxygen therapy for chronic wounds and Dr Kelly Splinter of the plastics team applied Acell several months ago to promote healing to left leg chronic wounds.  Pt states this week his legs began swelling and having a large amount of erythremia. Wound type:Left outer legs with 2 sites of chronic full thickness wounds.  9X4X.3cm and 4X2X.2cm.  Both sites 80% red, 20% yellow.  Mod amt yellow drainage, no odor.  Generalized redness and edema from bilat calves to feet, including toes. Right outer calf with 2 partial thickness wounds, .2X.2X.1cm and ankle area .5X.2X.1cm.  Both sites red with minimal yellow drainage, no odor.  Left 1-2 toes in between with partial thickness wound which extends to plantar surface of foot; approx 3X3X.2cm, red and moist with large amt yellow drainage, no odor, dry crusted scabs clean off easily, revealing moist dark red wound bed. Right 1-2 toes  between with partial thickness wound; approx 2X2X.2cm, red and moist with large amt yellow drainage, no odor, dry crusted scabs clean off easily, revealing moist dark red wound bed.  Dressing procedure/placement/frequency: Foam dressing to right leg to absorb drainage and promote healing.  Continue present plan of care from wound care center with Aquacel to provide antimicrobial benefits and absorb drainage.  Pt is currently on IV antibiotics which he states have already helped decrease the amt of erythremia and edema.  He can resume follow-up at the outpatient wound care center after discharge. Please re-consult if further assistance is needed.  Thank-you,  Cammie Mcgee MSN, RN, CWOCN, [place], CNS [phone]

## 2013-07-17 NOTE — Progress Notes (Signed)
Patient arrived on unit via bed. Vitals were taken upon arrival. Heart rate is elevated (108 bpm). Other vitals are stable. Pt rates pain a 1 on an scale of 1-10. Pt oriented to room.
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
## 2013-07-18 LAB — BASIC METABOLIC PANEL WITH GFR
BUN: 14 mg/dL (ref 6–23)
CO2: 23 meq/L (ref 19–32)
Calcium: 8.2 mg/dL — ABNORMAL LOW (ref 8.4–10.5)
Chloride: 105 meq/L (ref 96–112)
Creatinine, Ser: 1.41 mg/dL — ABNORMAL HIGH (ref 0.50–1.35)
GFR calc Af Amer: 80 mL/min — ABNORMAL LOW
GFR calc non Af Amer: 69 mL/min — ABNORMAL LOW
Glucose, Bld: 84 mg/dL (ref 70–99)
Potassium: 3.7 meq/L (ref 3.5–5.1)
Sodium: 139 meq/L (ref 135–145)

## 2013-07-18 LAB — CBC
HCT: 29.8 % — ABNORMAL LOW (ref 39.0–52.0)
Hemoglobin: 9.9 g/dL — ABNORMAL LOW (ref 13.0–17.0)
MCH: 26.4 pg (ref 26.0–34.0)
MCHC: 33.2 g/dL (ref 30.0–36.0)
MCV: 79.5 fL (ref 78.0–100.0)
Platelets: 388 K/uL (ref 150–400)
RBC: 3.75 MIL/uL — ABNORMAL LOW (ref 4.22–5.81)
RDW: 17.6 % — ABNORMAL HIGH (ref 11.5–15.5)
WBC: 13.7 K/uL — ABNORMAL HIGH (ref 4.0–10.5)

## 2013-07-18 LAB — URINE CULTURE
Colony Count: >=100000
Special Requests: NORMAL
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
## 2013-07-19 DIAGNOSIS — Z86718 Personal history of other venous thrombosis and embolism: Secondary | ICD-10-CM

## 2013-07-19 DIAGNOSIS — R609 Edema, unspecified: Secondary | ICD-10-CM

## 2013-07-19 DIAGNOSIS — L97809 Non-pressure chronic ulcer of other part of unspecified lower leg with unspecified severity: Secondary | ICD-10-CM

## 2013-07-19 DIAGNOSIS — IMO0002 Reserved for concepts with insufficient information to code with codable children: Secondary | ICD-10-CM

## 2013-07-19 LAB — CBC
HCT: 28.7 % — ABNORMAL LOW (ref 39.0–52.0)
Hemoglobin: 9.7 g/dL — ABNORMAL LOW (ref 13.0–17.0)
MCH: 27.1 pg (ref 26.0–34.0)
MCHC: 33.8 g/dL (ref 30.0–36.0)
MCV: 80.2 fL (ref 78.0–100.0)
Platelets: 493 10*3/uL — ABNORMAL HIGH (ref 150–400)
RBC: 3.58 MIL/uL — ABNORMAL LOW (ref 4.22–5.81)
RDW: 17.4 % — ABNORMAL HIGH (ref 11.5–15.5)
WBC: 12.1 10*3/uL — ABNORMAL HIGH (ref 4.0–10.5)

## 2013-07-19 LAB — BASIC METABOLIC PANEL
BUN: 12 mg/dL (ref 6–23)
CO2: 25 mEq/L (ref 19–32)
Calcium: 8.3 mg/dL — ABNORMAL LOW (ref 8.4–10.5)
Chloride: 107 mEq/L (ref 96–112)
Creatinine, Ser: 1.18 mg/dL (ref 0.50–1.35)
GFR calc Af Amer: >90 mL/min (ref >90)
GFR calc non Af Amer: 85 mL/min — ABNORMAL LOW (ref >90)
Glucose, Bld: 114 mg/dL — ABNORMAL HIGH (ref 70–99)
Potassium: 4.2 mEq/L (ref 3.5–5.1)
Sodium: 142 mEq/L (ref 135–145)

## 2013-07-19 MED ORDER — HYDROCHLOROTHIAZIDE 25 MG PO TABS
25.0000 mg | ORAL_TABLET | Freq: Every day | ORAL | Status: DC
  Administered 2013-07-19 – 2013-07-21 (×3): 25 mg via ORAL
  Filled 2013-07-19 (×3): qty 1

## 2013-07-19 NOTE — Progress Notes (Signed)
BSC placed at the bedside; patient will advise when he is ready to have BM.
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
## 2013-07-19 NOTE — Progress Notes (Signed)
ANTIBIOTIC CONSULT NOTE - FOLLOW UP  Pharmacy Consult for vancomycin Indication: rule out sepsis/cellulitis  Allergies  Allergen Reactions  . Sulfa Antibiotics Hives  . Levofloxacin Rash  . Meropenem Rash  . Penicillins Rash  . Zyvox [Linezolid] Rash    Patient Measurements: Height: 5\' 10"  (177.8 cm) Weight: 261 lb 14.5 oz (118.8 kg) IBW/kg (Calculated) : 73  Vital Signs: Temp: 98.5 F (36.9 C) (08/28 1433) Temp src: Oral (08/28 1433) BP: 125/63 mmHg (08/28 1433) Pulse Rate: 95 (08/28 1433) Intake/Output from previous day: 08/27 0701 - 08/28 0700 In: 1310 [P.O.:960; I.V.:350] Out: 3950 [Urine:3950] Intake/Output from this shift: Total I/O In: 1010 [P.O.:720; I.V.:40; IV Piggyback:250] Out: 400 [Urine:400]  Labs:  Recent Labs  07/17/13 0738 07/18/13 0550 07/19/13 0454  WBC 16.5* 13.7* 12.1*  HGB 9.8* 9.9* 9.7*  PLT 406* 388 493*  CREATININE 1.81* 1.41* 1.18   Estimated Creatinine Clearance: 124.7 ml/min (by C-G formula based on Cr of 1.18).   Microbiology: Recent Results (from the past 720 hour(s))  URINE CULTURE     Status: None   Collection Time    07/16/13  4:01 PM      Result Value Range Status   Specimen Description URINE, CATHETERIZED   Final   Special Requests Normal   Final   Culture  Setup Time     Final   Value: 07/16/2013 17:12     Performed at [facility] Count     Final   Value: >=100,000 COLONIES/ML     Performed at [facility]   Culture     Final   Value: Multiple bacterial morphotypes present, none predominant. Suggest appropriate recollection if clinically indicated.     Performed at [facility]   Report Status 07/18/2013 FINAL   Final  CULTURE, BLOOD (ROUTINE X 2)     Status: None   Collection Time    07/16/13  4:15 PM      Result Value Range Status   Specimen Description BLOOD LEFT ANTECUBITAL   Final   Special Requests BOTTLES DRAWN AEROBIC AND ANAEROBIC 10CC   Final   Culture  Setup Time      Final   Value: 07/16/2013 23:14     Performed at [facility]   Culture     Final   Value:        BLOOD CULTURE RECEIVED NO GROWTH TO DATE CULTURE WILL BE HELD FOR 5 DAYS BEFORE ISSUING A FINAL NEGATIVE REPORT     Performed at [facility]   Report Status PENDING   Incomplete  CULTURE, BLOOD (ROUTINE X 2)     Status: None   Collection Time    07/16/13  4:25 PM      Result Value Range Status   Specimen Description BLOOD HAND LEFT   Final   Special Requests BOTTLES DRAWN AEROBIC AND ANAEROBIC 10CC   Final   Culture  Setup Time     Final   Value: 07/16/2013 23:10     Performed at [facility]   Culture     Final   Value:        BLOOD CULTURE RECEIVED NO GROWTH TO DATE CULTURE WILL BE HELD FOR 5 DAYS BEFORE ISSUING A FINAL NEGATIVE REPORT     Performed at [facility]   Report Status PENDING   Incomplete  MRSA PCR SCREENING     Status: Abnormal   Collection Time    07/16/13  7:16 PM      Result Value Range Status   MRSA by PCR POSITIVE (*) NEGATIVE Final   Comment:            The GeneXpert MRSA Assay (FDA     approved for NASAL specimens     only), is one component of a     comprehensive MRSA colonization     surveillance program. It is not     intended to diagnose MRSA     infection nor to guide or     monitor treatment for     MRSA infections.    Anti-infectives   Start     Dose/Rate Route Frequency Ordered Stop   07/16/13 2100  vancomycin (VANCOCIN) 1,500 mg in sodium chloride 0.9 % 500 mL IVPB     1,500 mg 250 mL/hr over 120 Minutes Intravenous Every 24 hours 07/16/13 2004     07/16/13 1545  vancomycin (VANCOCIN) IVPB 1000 mg/200 mL premix     1,000 mg 200 mL/hr over 60 Minutes Intravenous  Once 07/16/13 1537 07/16/13 1840   07/16/13 1545  piperacillin-tazobactam (ZOSYN) IVPB 3.375 g     3.375 g 12.5 mL/hr over 240 Minutes Intravenous  Once 07/16/13 1537 07/16/13 1740   07/16/13 0000  piperacillin-tazobactam (ZOSYN) IVPB 3.375 g   Status:  Discontinued     3.375 g 12.5 mL/hr over 240 Minutes Intravenous Every 8 hours 07/16/13 2004 07/19/13 1231      Assessment: 69 parapalegic male who is admitted with chronic leg wounds and suspected sepsis from ID clinic. He was started on Vancomycin and Zosyn 8/25 for broad coverage as patient has history of infections. ID was consulted today and d/c'd Zosyn. His renal function is much improved from admission; SCr was 1.94, now is 1.18. 5th dose of vancomycin is due 8/29 and if patient is still on vancomycin then, will need to check a trough. WBC improved to 12.1 and patient has been afebrile for ~24 hours.  Goal of Therapy:  Vancomycin trough level 10-15 mcg/ml  Plan:  1. Continue vanomcyin 1500mg  IV Q24h 2. Will obtain a vanc trough before 8/29 if to continue on therapy 3. Follow up ID recommendations, c/s, renal function  Genevieve Ritzel D. Breean Nannini, PharmD Clinical Pharmacist Pager: [phone] 07/19/2013 3:12 PM
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
## 2013-07-19 NOTE — Progress Notes (Signed)
*  PRELIMINARY RESULTS* Vascular Ultrasound Lower extremity venous duplex has been completed.  Preliminary findings: No obvious evidence of DVT. Bilateral inguinal lymph nodes are very enlarged and hypervascular. Left lymph node has an irregular appearance.   Farrel Demark, RDMS, RVT  07/19/2013, 10:47 AM

## 2013-07-20 LAB — VANCOMYCIN, TROUGH: Vancomycin Tr: <5 ug/mL — ABNORMAL LOW (ref 10.0–20.0)

## 2013-07-20 LAB — BASIC METABOLIC PANEL
BUN: 11 mg/dL (ref 6–23)
CO2: 26 mEq/L (ref 19–32)
Calcium: 8.8 mg/dL (ref 8.4–10.5)
Chloride: 102 mEq/L (ref 96–112)
Creatinine, Ser: 1.14 mg/dL (ref 0.50–1.35)
GFR calc Af Amer: >90 mL/min (ref >90)
GFR calc non Af Amer: 89 mL/min — ABNORMAL LOW (ref >90)
Glucose, Bld: 87 mg/dL (ref 70–99)
Potassium: 3.7 mEq/L (ref 3.5–5.1)
Sodium: 140 mEq/L (ref 135–145)

## 2013-07-20 LAB — C-REACTIVE PROTEIN: CRP: 5.1 mg/dL — ABNORMAL HIGH (ref <0.60)

## 2013-07-20 LAB — HEPATITIS C ANTIBODY (REFLEX): HCV Ab: NEGATIVE

## 2013-07-20 LAB — SEDIMENTATION RATE: Sed Rate: 100 mm/hr — ABNORMAL HIGH (ref 0–16)

## 2013-07-20 MED ORDER — VANCOMYCIN HCL 10 G IV SOLR
1500.0000 mg | Freq: Two times a day (BID) | INTRAVENOUS | Status: DC
  Administered 2013-07-20 – 2013-07-21 (×2): 1500 mg via INTRAVENOUS
  Filled 2013-07-20 (×3): qty 1500

## 2013-07-20 MED ORDER — SODIUM CHLORIDE 0.9 % IJ SOLN
10.0000 mL | INTRAMUSCULAR | Status: DC | PRN
  Administered 2013-07-21 (×2): 10 mL

## 2013-07-20 MED ORDER — VANCOMYCIN HCL 10 G IV SOLR: 1500.0000 mg | INTRAVENOUS | Status: DC

## 2013-07-20 MED ORDER — NICOTINE 14 MG/24HR TD PT24
14.0000 mg | MEDICATED_PATCH | Freq: Every day | TRANSDERMAL | Status: DC
  Administered 2013-07-20 – 2013-07-21 (×2): 14 mg via TRANSDERMAL
  Filled 2013-07-20 (×2): qty 1

## 2013-07-20 NOTE — Progress Notes (Signed)
Peripherally Inserted Central Catheter/Midline Placement  The IV Nurse has discussed with the patient and/or persons authorized to consent for the patient, the purpose of this procedure and the potential benefits and risks involved with this procedure.  The benefits include less needle sticks, lab draws from the catheter and patient may be discharged home with the catheter.  Risks include, but not limited to, infection, bleeding, blood clot (thrombus formation), and puncture of an artery; nerve damage and irregular heat beat.  Alternatives to this procedure were also discussed.  PICC/Midline Placement Documentation  PICC / Midline Single Lumen 07/20/13 PICC Right Basilic (Active)  Dressing Change Due 07/27/13 07/20/2013  7:00 PM       Stacie Glaze Horton 07/20/2013, 7:21 PM

## 2013-07-20 NOTE — Progress Notes (Signed)
PATIENT DETAILS Name: [name] Age: 27 y.o. Sex: male Date of Birth: 03/02/1986 Admit Date: 07/16/2013 Admitting Physician Clydia Llano, MD [id number], Provider, MD  Subjective: Claims to feel better-swelling of b/l legs   Assessment/Plan: Sepsis with hypotension /Lower extremity cellulitis  -Patient was admitted, started on Vanco and Zosyn on admission, patient had made rapid clinical improvement with resolution of fever, renal failure and leukocytosis. Given allergies to multiple antibiotics and prior hx of multi drug resistant bugs, ID was consulted, Zosyn was discontinued on 8/28. -He continues to do well on Vanco. Await ID input regarding discharge antibiotic regimen -Leg swelling has improved significantly -Blood cultures drawn on admission-neg --Doppler lower extremities negative for DVT   Lower paraplegia/Spinal cord injury of T10 vertebra  -stable.   Acute renal failure  -Resolved with IV fluids  -possibly from sepsis- he does not appear dehydrated   Positive UA  - may be colonization as he self caths- per pt no recent symptoms of UTI  -Urine culture shows multiple morphotypes   Disposition: Remain inpatient  DVT Prophylaxis: Prophylactic Heparin  Code Status: Full code   Family Communication None at bedside  Procedures:  None  CONSULTS:  ID   MEDICATIONS: Scheduled Meds: . baclofen  10 mg Oral TID  . Chlorhexidine Gluconate Cloth  6 each Topical Q0600  . citalopram  20 mg Oral QHS  . heparin  5,000 Units Subcutaneous Q8H  . hydrochlorothiazide  25 mg Oral Daily  . imipramine  50 mg Oral QHS  . loratadine  10 mg Oral Daily  . mupirocin ointment  1 application Nasal BID  . oxybutynin  30 mg Oral QHS  . sodium chloride  3 mL Intravenous Q12H  . vancomycin  1,500 mg Intravenous Q24H   Continuous Infusions:  PRN Meds:.acetaminophen, acetaminophen, morphine injection, ondansetron (ZOFRAN) IV, ondansetron,  oxyCODONE  Antibiotics: Anti-infectives   Start     Dose/Rate Route Frequency Ordered Stop   07/16/13 2100  vancomycin (VANCOCIN) 1,500 mg in sodium chloride 0.9 % 500 mL IVPB     1,500 mg 250 mL/hr over 120 Minutes Intravenous Every 24 hours 07/16/13 2004     07/16/13 1545  vancomycin (VANCOCIN) IVPB 1000 mg/200 mL premix     1,000 mg 200 mL/hr over 60 Minutes Intravenous  Once 07/16/13 1537 07/16/13 1840   07/16/13 1545  piperacillin-tazobactam (ZOSYN) IVPB 3.375 g     3.375 g 12.5 mL/hr over 240 Minutes Intravenous  Once 07/16/13 1537 07/16/13 1740   07/16/13 0000  piperacillin-tazobactam (ZOSYN) IVPB 3.375 g  Status:  Discontinued     3.375 g 12.5 mL/hr over 240 Minutes Intravenous Every 8 hours 07/16/13 2004 07/19/13 1231       PHYSICAL EXAM: Vital signs in last 24 hours: Filed Vitals:   07/19/13 0829 07/19/13 1433 07/19/13 2009 07/20/13 0519  BP: 112/80 125/63 121/70 120/73  Pulse: 82 95 90 80  Temp:  98.5 F (36.9 C) 98.4 F (36.9 C) 98.3 F (36.8 C)  TempSrc:  Oral Oral Oral  Resp: 18 18 18 16   Height:      Weight:      SpO2:  96% 98% 95%    Weight change:  Filed Weights   07/16/13 1900 07/17/13 0411  Weight: 118.8 kg (261 lb 14.5 oz) 118.8 kg (261 lb 14.5 oz)   Body mass index is 37.58 kg/(m^2).   Gen Exam: Awake and alert with clear speech.   Neck: Supple, No JVD.   Chest: B/L Clear.  CVS: S1 S2 Regular, no murmurs.  Abdomen: soft, BS +, non tender, non distended.  Extremities:1-2+edema in lower ext-better than the past few days, mild patchy erythema in both leg. Neurologic: Paraplegic Skin: No Rash.   Wounds: N/A.    Intake/Output from previous day:  Intake/Output Summary (Last 24 hours) at 07/20/13 0841 Last data filed at 07/20/13 0650  Gross per 24 hour  Intake   1360 ml  Output   3700 ml  Net  -2340 ml     LAB RESULTS: CBC  Recent Labs Lab 07/16/13 1414 07/17/13 0738 07/18/13 0550 07/19/13 0454  WBC 20.7* 16.5* 13.7* 12.1*   HGB 12.1* 9.8* 9.9* 9.7*  HCT 35.6* 28.9* 29.8* 28.7*  PLT 453* 406* 388 493*  MCV 77.7* 78.5 79.5 80.2  MCH 26.4 26.6 26.4 27.1  MCHC 34.0 33.9 33.2 33.8  RDW 16.6* 17.0* 17.6* 17.4*  LYMPHSABS 3.3  --   --   --   MONOABS 3.5*  --   --   --   EOSABS 0.0  --   --   --   BASOSABS 0.0  --   --   --     Chemistries   Recent Labs Lab 07/16/13 1414 07/17/13 0738 07/18/13 0550 07/19/13 0454 07/20/13 0618  NA 133* 137 139 142 140  K 3.4* 3.5 3.7 4.2 3.7  CL 95* 105 105 107 102  CO2 23 22 23 25 26   GLUCOSE 107* 156* 84 114* 87  BUN 17 19 14 12 11   CREATININE 1.94* 1.81* 1.41* 1.18 1.14  CALCIUM 8.8 7.6* 8.2* 8.3* 8.8    CBG: No results found for this basename: GLUCAP,  in the last 168 hours  GFR Estimated Creatinine Clearance: 129 ml/min (by C-G formula based on Cr of 1.14).  Coagulation profile No results found for this basename: INR, PROTIME,  in the last 168 hours  Cardiac Enzymes No results found for this basename: CK, CKMB, TROPONINI, MYOGLOBIN,  in the last 168 hours  No components found with this basename: POCBNP,  No results found for this basename: DDIMER,  in the last 72 hours No results found for this basename: HGBA1C,  in the last 72 hours No results found for this basename: CHOL, HDL, LDLCALC, TRIG, CHOLHDL, LDLDIRECT,  in the last 72 hours No results found for this basename: TSH, T4TOTAL, FREET3, T3FREE, THYROIDAB,  in the last 72 hours No results found for this basename: VITAMINB12, FOLATE, FERRITIN, TIBC, IRON, RETICCTPCT,  in the last 72 hours No results found for this basename: LIPASE, AMYLASE,  in the last 72 hours  Urine Studies No results found for this basename: UACOL, UAPR, USPG, UPH, UTP, UGL, UKET, UBIL, UHGB, UNIT, UROB, ULEU, UEPI, UWBC, URBC, UBAC, CAST, CRYS, UCOM, BILUA,  in the last 72 hours  MICROBIOLOGY: Recent Results (from the past 240 hour(s))  URINE CULTURE     Status: None   Collection Time    07/16/13  4:01 PM      Result  Value Range Status   Specimen Description URINE, CATHETERIZED   Final   Special Requests Normal   Final   Culture  Setup Time     Final   Value: 07/16/2013 17:12     Performed at [facility] Count     Final   Value: >=100,000 COLONIES/ML     Performed at [facility]   Culture     Final   Value: Multiple bacterial morphotypes present, none  predominant. Suggest appropriate recollection if clinically indicated.     Performed at [facility]   Report Status 07/18/2013 FINAL   Final  CULTURE, BLOOD (ROUTINE X 2)     Status: None   Collection Time    07/16/13  4:15 PM      Result Value Range Status   Specimen Description BLOOD LEFT ANTECUBITAL   Final   Special Requests BOTTLES DRAWN AEROBIC AND ANAEROBIC 10CC   Final   Culture  Setup Time     Final   Value: 07/16/2013 23:14     Performed at [facility]   Culture     Final   Value:        BLOOD CULTURE RECEIVED NO GROWTH TO DATE CULTURE WILL BE HELD FOR 5 DAYS BEFORE ISSUING A FINAL NEGATIVE REPORT     Performed at [facility]   Report Status PENDING   Incomplete  CULTURE, BLOOD (ROUTINE X 2)     Status: None   Collection Time    07/16/13  4:25 PM      Result Value Range Status   Specimen Description BLOOD HAND LEFT   Final   Special Requests BOTTLES DRAWN AEROBIC AND ANAEROBIC 10CC   Final   Culture  Setup Time     Final   Value: 07/16/2013 23:10     Performed at [facility]   Culture     Final   Value:        BLOOD CULTURE RECEIVED NO GROWTH TO DATE CULTURE WILL BE HELD FOR 5 DAYS BEFORE ISSUING A FINAL NEGATIVE REPORT     Performed at [facility]   Report Status PENDING   Incomplete  MRSA PCR SCREENING     Status: Abnormal   Collection Time    07/16/13  7:16 PM      Result Value Range Status   MRSA by PCR POSITIVE (*) NEGATIVE Final   Comment:            The GeneXpert MRSA Assay (FDA     approved for NASAL specimens     only), is one component of a      comprehensive MRSA colonization     surveillance program. It is not     intended to diagnose MRSA     infection nor to guide or     monitor treatment for     MRSA infections.    RADIOLOGY STUDIES/RESULTS: No results found.  Jeoffrey Massed, MD  [facility] Pager:[phone]  If 7PM-7AM, please contact night-coverage [email or web address] Password [facility] 07/20/2013, 8:41 AM   LOS: 4 days

## 2013-07-20 NOTE — Progress Notes (Signed)
INFECTIOUS DISEASE PROGRESS NOTE  ID: [name] is a 27 y.o. male with  Principal Problem:   Sepsis Active Problems:   History of splenectomy   Lower paraplegia   Spinal cord injury of T10 vertebra   Lower extremity cellulitis   Acute renal failure  Subjective: Without complaints  Abtx:  Anti-infectives   Start     Dose/Rate Route Frequency Ordered Stop   07/16/13 2100  vancomycin (VANCOCIN) 1,500 mg in sodium chloride 0.9 % 500 mL IVPB     1,500 mg 250 mL/hr over 120 Minutes Intravenous Every 24 hours 07/16/13 2004     07/16/13 1545  vancomycin (VANCOCIN) IVPB 1000 mg/200 mL premix     1,000 mg 200 mL/hr over 60 Minutes Intravenous  Once 07/16/13 1537 07/16/13 1840   07/16/13 1545  piperacillin-tazobactam (ZOSYN) IVPB 3.375 g     3.375 g 12.5 mL/hr over 240 Minutes Intravenous  Once 07/16/13 1537 07/16/13 1740   07/16/13 0000  piperacillin-tazobactam (ZOSYN) IVPB 3.375 g  Status:  Discontinued     3.375 g 12.5 mL/hr over 240 Minutes Intravenous Every 8 hours 07/16/13 2004 07/19/13 1231      Medications:  Scheduled: . baclofen  10 mg Oral TID  . Chlorhexidine Gluconate Cloth  6 each Topical Q0600  . citalopram  20 mg Oral QHS  . heparin  5,000 Units Subcutaneous Q8H  . hydrochlorothiazide  25 mg Oral Daily  . imipramine  50 mg Oral QHS  . loratadine  10 mg Oral Daily  . mupirocin ointment  1 application Nasal BID  . oxybutynin  30 mg Oral QHS  . sodium chloride  3 mL Intravenous Q12H  . vancomycin  1,500 mg Intravenous Q24H    Objective: Vital signs in last 24 hours: Temp:  [98.3 F (36.8 C)-98.8 F (37.1 C)] 98.8 F (37.1 C) (08/29 1357) Pulse Rate:  [80-93] 93 (08/29 1357) Resp:  [16-18] 18 (08/29 1357) BP: (115-121)/(70-73) 115/71 mmHg (08/29 1357) SpO2:  [95 %-98 %] 95 % (08/29 1357)   General appearance: alert, cooperative and no distress Extremities: significant edema. his erythema in LE is dramatically improved from previous. wounds are  clean.   Lab Results  Recent Labs  07/18/13 0550 07/19/13 0454 07/20/13 0618  WBC 13.7* 12.1*  --   HGB 9.9* 9.7*  --   HCT 29.8* 28.7*  --   NA 139 142 140  K 3.7 4.2 3.7  CL 105 107 102  CO2 23 25 26   BUN 14 12 11   CREATININE 1.41* 1.18 1.14   Liver Panel No results found for this basename: PROT, ALBUMIN, AST, ALT, ALKPHOS, BILITOT, BILIDIR, IBILI,  in the last 72 hours Sedimentation Rate  Recent Labs  07/20/13 0618  ESRSEDRATE 100*   C-Reactive Protein  Recent Labs  07/20/13 0618  CRP 5.1*    Microbiology: Recent Results (from the past 240 hour(s))  URINE CULTURE     Status: None   Collection Time    07/16/13  4:01 PM      Result Value Range Status   Specimen Description URINE, CATHETERIZED   Final   Special Requests Normal   Final   Culture  Setup Time     Final   Value: 07/16/2013 17:12     Performed at [facility] Count     Final   Value: >=100,000 COLONIES/ML     Performed at [facility]  Final   Value: Multiple bacterial morphotypes present, none predominant. Suggest appropriate recollection if clinically indicated.     Performed at [facility]   Report Status 07/18/2013 FINAL   Final  CULTURE, BLOOD (ROUTINE X 2)     Status: None   Collection Time    07/16/13  4:15 PM      Result Value Range Status   Specimen Description BLOOD LEFT ANTECUBITAL   Final   Special Requests BOTTLES DRAWN AEROBIC AND ANAEROBIC 10CC   Final   Culture  Setup Time     Final   Value: 07/16/2013 23:14     Performed at [facility]   Culture     Final   Value:        BLOOD CULTURE RECEIVED NO GROWTH TO DATE CULTURE WILL BE HELD FOR 5 DAYS BEFORE ISSUING A FINAL NEGATIVE REPORT     Performed at [facility]   Report Status PENDING   Incomplete  CULTURE, BLOOD (ROUTINE X 2)     Status: None   Collection Time    07/16/13  4:25 PM      Result Value Range Status   Specimen Description BLOOD HAND LEFT    Final   Special Requests BOTTLES DRAWN AEROBIC AND ANAEROBIC 10CC   Final   Culture  Setup Time     Final   Value: 07/16/2013 23:10     Performed at [facility]   Culture     Final   Value:        BLOOD CULTURE RECEIVED NO GROWTH TO DATE CULTURE WILL BE HELD FOR 5 DAYS BEFORE ISSUING A FINAL NEGATIVE REPORT     Performed at [facility]   Report Status PENDING   Incomplete  MRSA PCR SCREENING     Status: Abnormal   Collection Time    07/16/13  7:16 PM      Result Value Range Status   MRSA by PCR POSITIVE (*) NEGATIVE Final   Comment:            The GeneXpert MRSA Assay (FDA     approved for NASAL specimens     only), is one component of a     comprehensive MRSA colonization     surveillance program. It is not     intended to diagnose MRSA     infection nor to guide or     monitor treatment for     MRSA infections.    Studies/Results: No results found.   Assessment/Plan: Cellulitis T10 paraplegia  Would  Continue vanco for 2 weeks total.  Place PIC Have him f/u with Wound Care at [facility] Have him f/u with ID (has previously seen Dr Beckie Busing and myself).  Appreciate wound care eval in hospital.  Total days of antibiotics: 5         Johny Sax Infectious Diseases (pager) [phone] [email or web address] 07/20/2013, 3:38 PM  LOS: 4 days

## 2013-07-20 NOTE — Care Management Note (Signed)
    Page 1 of 1   07/20/2013     4:07:34 PM   CARE MANAGEMENT NOTE 07/20/2013  Patient:  AJAMU, MAXON E   Account Number:  [id number]  Date Initiated:  07/20/2013  Documentation initiated by:  Letha Cape  Subjective/Objective Assessment:   dx sepsis  admit- from home. pta indep.     Action/Plan:   HHRN for IV ABX   Anticipated DC Date:  07/20/2013   Anticipated DC Plan:  HOME W HOME HEALTH SERVICES      DC Planning Services  CM consult      [facility] Choice  HOME HEALTH   Choice offered to / List presented to:  C-1 Patient        HH arranged  HH-1 RN      [facility] agency  [facility]   Status of service:  Completed, signed off Medicare Important Message given?   (If response is "NO", the following Medicare IM given date fields will be blank) Date Medicare IM given:   Date Additional Medicare IM given:    Discharge Disposition:  HOME W HOME HEALTH SERVICES  Per UR Regulation:  Reviewed for med. necessity/level of care/duration of stay  If discussed at Long Length of Stay Meetings, dates discussed:    Comments:  07/20/13 16:06 Letha Cape RN, BSN [phone] patient is from home, will need 2 weeks of iv vanc, patient chose [facility], referral made to [facility], Lupita Leash notified.  Soc will begin 24-48 hrs post discharge.

## 2013-07-20 NOTE — Progress Notes (Addendum)
ANTIBIOTIC CONSULT NOTE - FOLLOW UP  Pharmacy Consult:  Vancomycin Indication:  cellulitis  Allergies  Allergen Reactions  . Sulfa Antibiotics Hives  . Levofloxacin Rash  . Meropenem Rash  . Penicillins Rash  . Zyvox [Linezolid] Rash    Patient Measurements: Height: 5\' 10"  (177.8 cm) Weight: 261 lb 14.5 oz (118.8 kg) IBW/kg (Calculated) : 73  Vital Signs: Temp: 98.8 F (37.1 C) (08/29 1357) Temp src: Oral (08/29 1357) BP: 115/71 mmHg (08/29 1357) Pulse Rate: 93 (08/29 1357) Intake/Output from previous day: 08/28 0701 - 08/29 0700 In: 1620 [P.O.:1200; I.V.:170; IV Piggyback:250] Out: 3700 [Urine:3700]  Labs:  Recent Labs  07/18/13 0550 07/19/13 0454 07/20/13 0618  WBC 13.7* 12.1*  --   HGB 9.9* 9.7*  --   PLT 388 493*  --   CREATININE 1.41* 1.18 1.14   Estimated Creatinine Clearance: 129 ml/min (by C-G formula based on Cr of 1.14).  Recent Labs  07/20/13 1930  VANCOTROUGH <5.0*     Microbiology: Recent Results (from the past 720 hour(s))  URINE CULTURE     Status: None   Collection Time    07/16/13  4:01 PM      Result Value Range Status   Specimen Description URINE, CATHETERIZED   Final   Special Requests Normal   Final   Culture  Setup Time     Final   Value: 07/16/2013 17:12     Performed at [facility] Count     Final   Value: >=100,000 COLONIES/ML     Performed at [facility]   Culture     Final   Value: Multiple bacterial morphotypes present, none predominant. Suggest appropriate recollection if clinically indicated.     Performed at [facility]   Report Status 07/18/2013 FINAL   Final  CULTURE, BLOOD (ROUTINE X 2)     Status: None   Collection Time    07/16/13  4:15 PM      Result Value Range Status   Specimen Description BLOOD LEFT ANTECUBITAL   Final   Special Requests BOTTLES DRAWN AEROBIC AND ANAEROBIC 10CC   Final   Culture  Setup Time     Final   Value: 07/16/2013 23:14     Performed at [facility]   Culture     Final   Value:        BLOOD CULTURE RECEIVED NO GROWTH TO DATE CULTURE WILL BE HELD FOR 5 DAYS BEFORE ISSUING A FINAL NEGATIVE REPORT     Performed at [facility]   Report Status PENDING   Incomplete  CULTURE, BLOOD (ROUTINE X 2)     Status: None   Collection Time    07/16/13  4:25 PM      Result Value Range Status   Specimen Description BLOOD HAND LEFT   Final   Special Requests BOTTLES DRAWN AEROBIC AND ANAEROBIC 10CC   Final   Culture  Setup Time     Final   Value: 07/16/2013 23:10     Performed at [facility]   Culture     Final   Value:        BLOOD CULTURE RECEIVED NO GROWTH TO DATE CULTURE WILL BE HELD FOR 5 DAYS BEFORE ISSUING A FINAL NEGATIVE REPORT     Performed at [facility]   Report Status PENDING   Incomplete  MRSA PCR SCREENING     Status: Abnormal   Collection Time  07/16/13  7:16 PM      Result Value Range Status   MRSA by PCR POSITIVE (*) NEGATIVE Final   Comment:            The GeneXpert MRSA Assay (FDA     approved for NASAL specimens     only), is one component of a     comprehensive MRSA colonization     surveillance program. It is not     intended to diagnose MRSA     infection nor to guide or     monitor treatment for     MRSA infections.      Assessment: 27 YO paraplegic male with chronic leg wounds to continue on vancomycin therapy.  Patient's renal function has improved significantly and his urine output remains excellent (1.3-1.5 ml/kg/hr).  Vancomycin trough was obtained early and it is sub-therapeutic.   Goal of Therapy:  Vancomycin trough level 10-15 mcg/ml   Plan:  - Increase vanc to 1500mg  IV Q12H - Monitor renal fxn, UOP, vanc trough at Css of new regimen     Tyjai Matuszak D. Laney Potash, PharmD, BCPS Pager:  [phone] 07/20/2013, 8:56 PM

## 2013-07-21 LAB — BASIC METABOLIC PANEL
BUN: 14 mg/dL (ref 6–23)
CO2: 27 mEq/L (ref 19–32)
Calcium: 8.3 mg/dL — ABNORMAL LOW (ref 8.4–10.5)
Chloride: 102 mEq/L (ref 96–112)
Creatinine, Ser: 1.07 mg/dL (ref 0.50–1.35)
GFR calc Af Amer: >90 mL/min (ref >90)
GFR calc non Af Amer: >90 mL/min (ref >90)
Glucose, Bld: 86 mg/dL (ref 70–99)
Potassium: 3.6 mEq/L (ref 3.5–5.1)
Sodium: 139 mEq/L (ref 135–145)

## 2013-07-21 MED ORDER — VANCOMYCIN HCL 10 G IV SOLR: 1500.0000 mg | Freq: Two times a day (BID) | INTRAVENOUS | Status: DC

## 2013-07-21 MED ORDER — HYDROCHLOROTHIAZIDE 25 MG PO TABS: 25.0000 mg | ORAL_TABLET | Freq: Every day | ORAL | Status: DC

## 2013-07-21 MED ORDER — NICOTINE 14 MG/24HR TD PT24: 1.0000 | MEDICATED_PATCH | Freq: Every day | TRANSDERMAL | Status: DC

## 2013-07-21 MED ORDER — DIAZEPAM 10 MG PO TABS: 10.0000 mg | ORAL_TABLET | Freq: Every day | ORAL | Status: DC

## 2013-07-21 NOTE — Plan of Care (Signed)
Problem: Discharge Progression Outcomes Goal: Discharge plan in place and appropriate Outcome: Completed/Met Date Met:  07/21/13 Home with HH Goal: Wound improving/decreased edema Outcome: Completed/Met Date Met:  07/21/13 Able to voice  S/s of infection ,  Increase temp, foul or increase in drainage from wounds. etc,  states his girl friend would be changing his dsg and she knows what to dodsg was changed today 07/21/13 [name]  Goal: Patient verbalizes wound care regimen Outcome: Completed/Met Date Met:  07/21/13 States girl friend would be changing dsg and she knows what to do

## 2013-07-21 NOTE — Progress Notes (Signed)
   CARE MANAGEMENT NOTE 07/21/2013  Patient:  [name], [name]   Account Number:  [id number]  Date Initiated:  07/20/2013  Documentation initiated by:  Letha Cape  Subjective/Objective Assessment:   dx sepsis  admit- from home. pta indep.     Action/Plan:   HHRN for IV ABX   Anticipated DC Date:  07/20/2013   Anticipated DC Plan:  HOME W HOME HEALTH SERVICES      DC Planning Services  CM consult      [facility] Choice  HOME HEALTH   Choice offered to / List presented to:  C-1 Patient        HH arranged  HH-1 RN      [facility] agency  [facility]   Status of service:  Completed, signed off Medicare Important Message given?   (If response is "NO", the following Medicare IM given date fields will be blank) Date Medicare IM given:   Date Additional Medicare IM given:    Discharge Disposition:  HOME W HOME HEALTH SERVICES  Per UR Regulation:  Reviewed for med. necessity/level of care/duration of stay  If discussed at Long Length of Stay Meetings, dates discussed:    Comments:  07/21/13 10:30 MD changed dosage of home IV ABX. Prescription faxed to [facility] and telephoned to arrange BID ABX starting this evening at 21:00.  HHRN order placed for vanc levels.  No other CM needs communicated.  Freddy Jaksch, BSN, [phone].  07/20/13 16:06 Letha Cape RN, BSN [phone] patient is from home, will need 2 weeks of iv vanc, patient chose [facility], referral made to [facility], Lupita Leash notified.  Soc will begin 24-48 hrs post discharge.
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
## 2013-07-22 LAB — CULTURE, BLOOD (ROUTINE X 2)
Culture: NO GROWTH
Culture: NO GROWTH

## 2013-07-23 LAB — HIV-1 RNA QUANT-NO REFLEX-BLD
HIV 1 RNA Quant: <20 copies/mL (ref <20)
HIV-1 RNA Quant, Log: <1.3 {Log} (ref <1.30)

## 2013-08-03 ENCOUNTER — Inpatient Hospital Stay: Payer: PRIVATE HEALTH INSURANCE | Admitting: Internal Medicine

## 2013-08-06 ENCOUNTER — Encounter: Payer: Self-pay | Admitting: Internal Medicine

## 2013-08-06 ENCOUNTER — Ambulatory Visit (INDEPENDENT_AMBULATORY_CARE_PROVIDER_SITE_OTHER): Payer: PRIVATE HEALTH INSURANCE | Admitting: Internal Medicine

## 2013-08-06 VITALS — BP 125/81 | HR 114 | Temp 98.1°F | Ht 70.0 in

## 2013-08-06 DIAGNOSIS — L0390 Cellulitis, unspecified: Secondary | ICD-10-CM

## 2013-08-06 DIAGNOSIS — L0291 Cutaneous abscess, unspecified: Secondary | ICD-10-CM

## 2013-08-06 DIAGNOSIS — Z23 Encounter for immunization: Secondary | ICD-10-CM

## 2013-08-06 LAB — C-REACTIVE PROTEIN: CRP: 3.9 mg/dL — ABNORMAL HIGH (ref <0.60)

## 2013-08-06 LAB — SEDIMENTATION RATE: Sed Rate: 60 mm/hr — ABNORMAL HIGH (ref 0–16)

## 2013-08-06 NOTE — Progress Notes (Signed)
[facility] CLINIC NOTE   RFV: recurrent cellulitis and deep tissue infection Subjective:    Patient ID: [name], male    DOB: 1986-02-14, 27 y.o.   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 He is not having any difficulty with infusion, his legs are much improved in erythema. Left leg lateral supramalleolus lesiotill has heavy grey greenish exudate for which he treats with silver algenate pad dressing changes QOD. He also applies it to lesion between 1st and 2nd toe of left foot nad some mild ulceration on dorsum of foot. Right foot also has some maceration between 1st and 2nd toe for which he applies dressing to as well. He was last seen at [facility] wound [facility] 2 wks ago. Silver algenate with every dressing change,  No fever or chills.  Current Outpatient Prescriptions on File Prior to Visit  Medication Sig Dispense Refill  . baclofen (LIORESAL) 10 MG tablet Take 10 mg by mouth 3 (three) times daily.       . citalopram (CELEXA) 20 MG tablet Take 20 mg by mouth at bedtime.      . diazepam (VALIUM) 10 MG tablet Take 1 tablet (10 mg total) by mouth at bedtime.  30 tablet    . fexofenadine (ALLEGRA) 180 MG tablet Take 180 mg by mouth at bedtime.       . hydrochlorothiazide (HYDRODIURIL) 25 MG tablet Take 1 tablet (25 mg total) by mouth daily.      Marland Kitchen imipramine (TOFRANIL) 50 MG tablet Take 50 mg by mouth at bedtime.       Marland Kitchen oxybutynin (DITROPAN XL) 15 MG 24 hr tablet Take 30 mg by mouth at bedtime.        No current facility-administered medications on file prior to visit.   Active Ambulatory Problems    Diagnosis Date Noted  . Decubitus ulcer of left leg 04/27/2012  . 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 History of DVT (deep vein  thrombosis) 05/01/2012  . 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 Leg fracture, right 05/01/2012  . Lower paraplegia   . Spinal cord injury of T10 vertebra   . History of DVT of lower extremity   . Leukocytosis 06/26/2012  . Infected decubitus ulcer 06/26/2012  . Allergy to meropenem 02/27/2013  . Lower extremity cellulitis 07/16/2013  . Sepsis 07/16/2013  . Acute renal failure 07/16/2013   Resolved Ambulatory Problems    Diagnosis Date Noted  . No Resolved Ambulatory Problems   Past Medical History  Diagnosis Date  . Seasonal allergies   . Mild acid reflux WATCHES DIET  . Urge incontinence   . Spastic neurogenic bladder   . Self-catheterizes urinary bladder 4 TO 6 TIMES DAILY AND PRN  . Leg ulcer, left   . OSA on CPAP MODERATE  . Wheelchair bound      Review of Systems 12 point ros is negative    Objective:   Physical Exam BP 125/81  Pulse 114  Temp(Src) 98.1 F (36.7 C) (Oral)  Ht 5\' 10"  (1.778 m) gen = a xo by 3 in NAD Skin = right lower leg has lateral malleolus eschar/scab nontender, macerated toes between 1st and 2nd toe. The left lower  leg has chronic ulceration to supra lateral malleolus 5 x 3 cm. Area between toes of 1 and 2nd toe is improved. Dry exfoliation noted of left leg. Ext: right arm picc line non tender, no erythema Neuro= flaccid paralysis on lower extremities     Assessment & Plan:  Cellulitis = continue with vancomycin. We will check sed rate and crp today. If still elevated, may consider to treat for a total of 4 wks instead of 2 wks. Continue with local wound care  30 min spent with patient greater than 50% spent with counseling and wound care

## 2013-08-08 ENCOUNTER — Encounter (HOSPITAL_COMMUNITY): Payer: Self-pay | Admitting: *Deleted

## 2013-08-08 ENCOUNTER — Emergency Department (HOSPITAL_COMMUNITY): Payer: PRIVATE HEALTH INSURANCE

## 2013-08-08 ENCOUNTER — Emergency Department (HOSPITAL_COMMUNITY)
Admission: EM | Admit: 2013-08-08 | Discharge: 2013-08-09 | Disposition: A | Discharge status: Home / Self Care | Payer: PRIVATE HEALTH INSURANCE | Attending: Emergency Medicine | Admitting: Emergency Medicine

## 2013-08-08 DIAGNOSIS — Y849 Medical procedure, unspecified as the cause of abnormal reaction of the patient, or of later complication, without mention of misadventure at the time of the procedure: Secondary | ICD-10-CM | POA: Insufficient documentation

## 2013-08-08 DIAGNOSIS — Z87448 Personal history of other diseases of urinary system: Secondary | ICD-10-CM | POA: Insufficient documentation

## 2013-08-08 DIAGNOSIS — Z880 Allergy status to penicillin: Secondary | ICD-10-CM | POA: Insufficient documentation

## 2013-08-08 DIAGNOSIS — Z993 Dependence on wheelchair: Secondary | ICD-10-CM | POA: Insufficient documentation

## 2013-08-08 DIAGNOSIS — Z791 Long term (current) use of non-steroidal anti-inflammatories (NSAID): Secondary | ICD-10-CM | POA: Insufficient documentation

## 2013-08-08 DIAGNOSIS — T82598A Other mechanical complication of other cardiac and vascular devices and implants, initial encounter: Secondary | ICD-10-CM | POA: Insufficient documentation

## 2013-08-08 DIAGNOSIS — Z9981 Dependence on supplemental oxygen: Secondary | ICD-10-CM | POA: Insufficient documentation

## 2013-08-08 DIAGNOSIS — K219 Gastro-esophageal reflux disease without esophagitis: Secondary | ICD-10-CM | POA: Insufficient documentation

## 2013-08-08 DIAGNOSIS — Z87828 Personal history of other (healed) physical injury and trauma: Secondary | ICD-10-CM | POA: Insufficient documentation

## 2013-08-08 DIAGNOSIS — T82898A Other specified complication of vascular prosthetic devices, implants and grafts, initial encounter: Secondary | ICD-10-CM

## 2013-08-08 DIAGNOSIS — Z86718 Personal history of other venous thrombosis and embolism: Secondary | ICD-10-CM | POA: Insufficient documentation

## 2013-08-08 DIAGNOSIS — G4733 Obstructive sleep apnea (adult) (pediatric): Secondary | ICD-10-CM | POA: Insufficient documentation

## 2013-08-08 DIAGNOSIS — Z87891 Personal history of nicotine dependence: Secondary | ICD-10-CM | POA: Insufficient documentation

## 2013-08-08 DIAGNOSIS — Z79899 Other long term (current) drug therapy: Secondary | ICD-10-CM | POA: Insufficient documentation

## 2013-08-08 LAB — URINALYSIS, ROUTINE W REFLEX MICROSCOPIC
Bilirubin Urine: NEGATIVE
Glucose, UA: NEGATIVE mg/dL
Ketones, ur: NEGATIVE mg/dL
Nitrite: NEGATIVE
Protein, ur: NEGATIVE mg/dL
Specific Gravity, Urine: 1.009 (ref 1.005–1.030)
Urobilinogen, UA: 0.2 mg/dL (ref 0.0–1.0)
pH: 6.5 (ref 5.0–8.0)

## 2013-08-08 LAB — URINE MICROSCOPIC-ADD ON

## 2013-08-08 MED ORDER — VANCOMYCIN HCL 10 G IV SOLR
2000.0000 mg | Freq: Once | INTRAVENOUS | Status: AC
  Administered 2013-08-08: 2000 mg via INTRAVENOUS
  Filled 2013-08-08: qty 2000

## 2013-08-08 NOTE — ED Notes (Signed)
The pt has cellulitis in both legs  And he thinks he has a uti

## 2013-08-08 NOTE — ED Notes (Signed)
The pt has a picc line in his rt upper arm and since last night the picc line has moved out more.  hes here to have the picc line removed.  He is currently getting vancomycin through this line
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
## 2013-08-09 ENCOUNTER — Ambulatory Visit (HOSPITAL_COMMUNITY)
Admission: RE | Admit: 2013-08-09 | Discharge: 2013-08-09 | Disposition: A | Discharge status: Home / Self Care | Payer: PRIVATE HEALTH INSURANCE | Source: Ambulatory Visit | Attending: Emergency Medicine | Admitting: Emergency Medicine

## 2013-08-09 ENCOUNTER — Other Ambulatory Visit (HOSPITAL_COMMUNITY): Payer: Self-pay | Admitting: Emergency Medicine

## 2013-08-09 DIAGNOSIS — L02818 Cutaneous abscess of other sites: Secondary | ICD-10-CM | POA: Insufficient documentation

## 2013-08-09 DIAGNOSIS — B999 Unspecified infectious disease: Secondary | ICD-10-CM

## 2013-08-09 NOTE — ED Provider Notes (Signed)
Medical screening examination/treatment/procedure(s) were performed by non-physician practitioner and as supervising physician I was immediately available for consultation/collaboration.   Brandt Loosen, MD 08/09/13 2221

## 2013-08-09 NOTE — ED Provider Notes (Signed)
Care assumed from [place], [place].  [name] is a 27 y.o. male presents with cellulitis and PICC line malfunction.  Pt's PICC line was removed by the IV team tonight and IR will call the pt in the morning with an appointment for re-insertion of the line.  Pt is currently receiving his nightly dose of vancomycin through a peripheral line.  Plan:  Pt to be d/c when his Vancomycin has completed.    1:48 AM Face to face Exam:   General: Awake  HEENT: Atraumatic  Resp: Normal effort  Abd: Nondistended  Neuro:No focal weakness  Lymph: No adenopathy  Vanc completed without adverse reaction.    It has been determined that no further acute conditions requiring further emergency intervention are present at this time. The patient/guardian have been advised of the diagnosis and plan. We have discussed signs and symptoms that warrant return to the ED, such as changes or worsening in symptoms.   Vital signs are stable at discharge.   BP 120/71  Pulse 68  Temp(Src) 98.4 F (36.9 C) (Oral)  Resp 18  SpO2 99%  Patient/guardian has voiced understanding and agreed to follow-up with the PCP or specialist.      Dierdre Forth, PA-C 08/09/13 0149

## 2013-08-09 NOTE — Procedures (Signed)
Interventional Radiology Procedure Note  Procedure: Placement of a Right arm single lumen PowerPICC.  Tip in superior CAJ and ready for use.  Complications: None Recommendations: - Routine line care.   Signed,  Sterling Big, MD Vascular & Interventional Radiologist [facility] Radiology

## 2013-08-10 NOTE — ED Provider Notes (Signed)
Medical screening examination/treatment/procedure(s) were performed by non-physician practitioner and as supervising physician I was immediately available for consultation/collaboration.   Shelda Jakes, MD 08/10/13 [phone]

## 2013-08-11 LAB — URINE CULTURE: Colony Count: 40000

## 2013-08-12 ENCOUNTER — Telehealth (HOSPITAL_COMMUNITY): Payer: Self-pay | Admitting: Emergency Medicine

## 2013-08-12 NOTE — Progress Notes (Signed)
ED Antimicrobial Stewardship Positive Culture Follow Up   [name] is an 27 y.o. male who presented to [facility] on 08/08/2013 with a chief complaint of  Chief Complaint  Patient presents with  . needs picc line     Recent Results (from the past 720 hour(s))  URINE CULTURE     Status: None   Collection Time    07/16/13  4:01 PM      Result Value Range Status   Specimen Description URINE, CATHETERIZED   Final   Special Requests Normal   Final   Culture  Setup Time     Final   Value: 07/16/2013 17:12     Performed at [facility] Count     Final   Value: >=100,000 COLONIES/ML     Performed at [facility]   Culture     Final   Value: Multiple bacterial morphotypes present, none predominant. Suggest appropriate recollection if clinically indicated.     Performed at [facility]   Report Status 07/18/2013 FINAL   Final  CULTURE, BLOOD (ROUTINE X 2)     Status: None   Collection Time    07/16/13  4:15 PM      Result Value Range Status   Specimen Description BLOOD LEFT ANTECUBITAL   Final   Special Requests BOTTLES DRAWN AEROBIC AND ANAEROBIC 10CC   Final   Culture  Setup Time     Final   Value: 07/16/2013 23:14     Performed at [facility]   Culture     Final   Value: NO GROWTH 5 DAYS     Performed at [facility]   Report Status 07/22/2013 FINAL   Final  CULTURE, BLOOD (ROUTINE X 2)     Status: None   Collection Time    07/16/13  4:25 PM      Result Value Range Status   Specimen Description BLOOD HAND LEFT   Final   Special Requests BOTTLES DRAWN AEROBIC AND ANAEROBIC 10CC   Final   Culture  Setup Time     Final   Value: 07/16/2013 23:10     Performed at [facility]   Culture     Final   Value: NO GROWTH 5 DAYS     Performed at [facility]   Report Status 07/22/2013 FINAL   Final  MRSA PCR SCREENING     Status: Abnormal   Collection Time    07/16/13  7:16 PM      Result Value Range Status   MRSA by PCR POSITIVE (*) NEGATIVE Final   Comment:            The GeneXpert MRSA Assay (FDA     approved for NASAL specimens     only), is one component of a     comprehensive MRSA colonization     surveillance program. It is not     intended to diagnose MRSA     infection nor to guide or     monitor treatment for     MRSA infections.  URINE CULTURE     Status: None   Collection Time    08/08/13 10:22 PM      Result Value Range Status   Specimen Description URINE, CLEAN CATCH   Final   Special Requests NONE   Final   Culture  Setup Time     Final   Value: 08/08/2013 23:09  Performed at [facility] Count     Final   Value: 40,000 COLONIES/ML     Performed at [facility]   Culture     Final   Value: PROTEUS MIRABILIS     Performed at [facility]   Report Status 08/11/2013 FINAL   Final   Organism ID, Bacteria PROTEUS MIRABILIS   Final    Patient presented to the ED to have PICC line evaluated and later replaced. No UTI symptoms were reported and urine culture produced low colony count - recommend no treatment at this time for asymptomatic bacteruria.    ED Provider: Junius Finner, PA-C   [name] 08/12/2013, 4:22 PM Infectious Diseases Pharmacist Phone# [phone]

## 2013-08-12 NOTE — ED Notes (Signed)
Post ED Visit - Positive Culture Follow-up  Culture report reviewed by antimicrobial stewardship pharmacist: [x]  Wes Dulaney, Pharm.D., BCPS []  Celedonio Miyamoto, Pharm.D., BCPS []  Georgina Pillion, Pharm.D., BCPS []  [place], [address].D., BCPS, AAHIVP []  Estella Husk, Pharm.D., BCPS, AAHIVP  Positive urine culture Per Junius Finner PA-C, asymptomatic bacteria, no treatment indicated at this time and no further patient follow-up is required at this time.  Kylie A Holland 08/12/2013, 5:01 PM

## 2013-08-20 ENCOUNTER — Ambulatory Visit (INDEPENDENT_AMBULATORY_CARE_PROVIDER_SITE_OTHER): Payer: PRIVATE HEALTH INSURANCE | Admitting: Internal Medicine

## 2013-08-20 ENCOUNTER — Encounter: Payer: Self-pay | Admitting: Internal Medicine

## 2013-08-20 VITALS — BP 126/73 | HR 111 | Temp 98.7°F

## 2013-08-20 DIAGNOSIS — L0291 Cutaneous abscess, unspecified: Secondary | ICD-10-CM

## 2013-08-20 DIAGNOSIS — L0390 Cellulitis, unspecified: Secondary | ICD-10-CM

## 2013-08-20 LAB — CBC WITH DIFFERENTIAL/PLATELET
Basophils Absolute: 0.1 10*3/uL (ref 0.0–0.1)
Basophils Relative: 1 % (ref 0–1)
Eosinophils Absolute: 0.3 10*3/uL (ref 0.0–0.7)
Eosinophils Relative: 3 % (ref 0–5)
HCT: 30.4 % — ABNORMAL LOW (ref 39.0–52.0)
Hemoglobin: 10.3 g/dL — ABNORMAL LOW (ref 13.0–17.0)
Lymphocytes Relative: 22 % (ref 12–46)
Lymphs Abs: 2.6 10*3/uL (ref 0.7–4.0)
MCH: 26 pg (ref 26.0–34.0)
MCHC: 33.9 g/dL (ref 30.0–36.0)
MCV: 76.8 fL — ABNORMAL LOW (ref 78.0–100.0)
Monocytes Absolute: 1.4 10*3/uL — ABNORMAL HIGH (ref 0.1–1.0)
Monocytes Relative: 12 % (ref 3–12)
Neutro Abs: 7.5 10*3/uL (ref 1.7–7.7)
Neutrophils Relative %: 62 % (ref 43–77)
Platelets: 591 10*3/uL — ABNORMAL HIGH (ref 150–400)
RBC: 3.96 MIL/uL — ABNORMAL LOW (ref 4.22–5.81)
RDW: 15.2 % (ref 11.5–15.5)
WBC: 11.9 10*3/uL — ABNORMAL HIGH (ref 4.0–10.5)

## 2013-08-20 LAB — C-REACTIVE PROTEIN: CRP: 4.1 mg/dL — ABNORMAL HIGH (ref <0.60)

## 2013-08-20 MED ORDER — CLINDAMYCIN HCL 300 MG PO CAPS: 300.0000 mg | ORAL_CAPSULE | Freq: Four times a day (QID) | ORAL | Status: DC

## 2013-08-20 NOTE — Progress Notes (Signed)
[facility] NOTE  RFV: routine for deep tissue infection Subjective:    Patient ID: [name], male    DOB: 04/19/1986, 27 y.o.   MRN: [id number]  HPI [name] is a paraplegic who has on chronic decub ulcers ot left leg s/p skin graft but then developed sepsis from cellulitis, he was discharged from hospital at end of August with vancomycin for the past 30-34 days. He is not having any difficulty with infusion with exception that he had to have it replaced since it migrated out of his arm. his legs are much improved in erythema. Left leg lateral supramalleolus lesion has a beefy wound bed with light whitish exudate which he treats with silver algenate pad dressing changes QOD. He also applies it to lesion between 1st and 2nd toe of left foot nad some mild ulceration on dorsum of foot. Right foot also has some maceration between 1st and 2nd toe for which he applies dressing to as well. He was last seen at [facility] this week who have placed him in a multipodus boot in addition to Silver algenate with every dressing change. They see him next in November as well as seeing neurology for increased spasisticity of lower extremities.  During dressing change, the left leg looks improved, but the size of ulcers are essentially unchanged in measurements. The wound beds appear clearer on the lateral lesion.   Allergies  Allergen Reactions  . Sulfa Antibiotics Hives  . Levofloxacin Rash  . Meropenem Rash  . Penicillins Rash  . Zyvox [Linezolid] Rash   Current Outpatient Prescriptions on File Prior to Visit  Medication Sig Dispense Refill  . baclofen (LIORESAL) 10 MG tablet Take 10 mg by mouth 3 (three) times daily.       . citalopram (CELEXA) 20 MG tablet Take 20 mg by mouth at bedtime.      . diazepam (VALIUM) 10 MG tablet Take 1 tablet (10 mg total) by mouth at bedtime.  30 tablet    . fexofenadine (ALLEGRA) 180 MG tablet Take 180 mg by mouth at bedtime.       . hydrochlorothiazide  (HYDRODIURIL) 25 MG tablet Take 1 tablet (25 mg total) by mouth daily.      Marland Kitchen imipramine (TOFRANIL) 50 MG tablet Take 50 mg by mouth at bedtime.       Marland Kitchen oxybutynin (DITROPAN XL) 15 MG 24 hr tablet Take 30 mg by mouth at bedtime.       . sodium chloride 0.9 % SOLN 500 mL with vancomycin 10 G SOLR Inject 2,000 mg into the vein daily. Last Dose on 07/30/13       No current facility-administered medications on file prior to visit.    Active Ambulatory Problems    Diagnosis Date Noted  . Decubitus ulcer of left leg 04/27/2012  . 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 History of DVT (deep vein thrombosis) 05/01/2012  . 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 Leg fracture, right 05/01/2012  . Lower paraplegia   . Spinal cord injury of T10 vertebra   . History of DVT of lower extremity   . Leukocytosis 06/26/2012  . Infected decubitus ulcer 06/26/2012  . Allergy to meropenem 02/27/2013  . Lower extremity cellulitis 07/16/2013  . Sepsis 07/16/2013  . Acute renal failure 07/16/2013   Resolved Ambulatory Problems    Diagnosis Date Noted  . No Resolved Ambulatory Problems   Past Medical History  Diagnosis Date  . Seasonal allergies   .  Mild acid reflux WATCHES DIET  . Urge incontinence   . Spastic neurogenic bladder   . Self-catheterizes urinary bladder 4 TO 6 TIMES DAILY AND PRN  . Leg ulcer, left   . OSA on CPAP MODERATE  . Wheelchair bound      Review of Systems 12 point ROs is negative, except for noticing increasing spasticity to his lower extremities bilaterally    Objective:   Physical Exam BP 126/73  Pulse 111  Temp(Src) 98.7 F (37.1 C) (Oral) gen = a x o by 3 in NAD HEENt= [place]/AT, perrla, eomi, OP clear Skin = the left leg looks improved, but the size of ulcers are essentially unchanged in measurements. The wound beds appear clearer on the lateral lesion. Ulcerative lesion between 1s and 2nd toe of left foot has slightly heavier exudate in wound bed,  erythema is minimal  Lab Results  Component Value Date   ESRSEDRATE 71* 08/20/2013   Lab Results  Component Value Date   CRP 4.1* 08/20/2013   Lab Results  Component Value Date   WBC 11.9* 08/20/2013   HGB 10.3* 08/20/2013   HCT 30.4* 08/20/2013   MCV 76.8* 08/20/2013   PLT 591* 08/20/2013       Assessment & Plan:  Deep tissue infection= will check cbc, crp and sed rate. Patient has been on vancomycin for 4 wk. We will change to clindamycin and discontinue vanco and picc line.  Addendum = his inflammatory markers are still quite elevated despite being on vancomycin. We will also had gram negative coverage, and have him come back in 2-3 wks.  rtc 2-3 wk

## 2013-08-21 LAB — SEDIMENTATION RATE: Sed Rate: 71 mm/hr — ABNORMAL HIGH (ref 0–16)

## 2013-08-24 ENCOUNTER — Telehealth: Payer: Self-pay | Admitting: *Deleted

## 2013-08-24 DIAGNOSIS — L0390 Cellulitis, unspecified: Secondary | ICD-10-CM

## 2013-08-24 MED ORDER — CIPROFLOXACIN HCL 750 MG PO TABS: 750.0000 mg | ORAL_TABLET | Freq: Two times a day (BID) | ORAL | Status: DC

## 2013-08-24 NOTE — Telephone Encounter (Addendum)
Per Dr Drue Second called the patient to schedule him a sooner appt. He is coming 09/05/13 at 1015 am. He also advised he has had Cipro before and had no problems with it. advosed him will let the doctor know and if she needs to prescribe him some will call him back later today.  Spoke with Dr Drue Second who gave a verbal to call in Cipro 750 mg 1 tab bid x 14 days. Advised the patient of this and he will pick it up today.

## 2013-09-05 ENCOUNTER — Ambulatory Visit (INDEPENDENT_AMBULATORY_CARE_PROVIDER_SITE_OTHER): Payer: PRIVATE HEALTH INSURANCE | Admitting: Internal Medicine

## 2013-09-05 ENCOUNTER — Encounter: Payer: Self-pay | Admitting: Internal Medicine

## 2013-09-05 VITALS — BP 114/72 | HR 119 | Temp 98.6°F

## 2013-09-05 DIAGNOSIS — L089 Local infection of the skin and subcutaneous tissue, unspecified: Secondary | ICD-10-CM

## 2013-09-05 DIAGNOSIS — Z5189 Encounter for other specified aftercare: Secondary | ICD-10-CM

## 2013-09-05 NOTE — Progress Notes (Signed)
[facility] NOTE  RFV: follow up to chronic ulcer and deep tissue infection Subjective:    Patient ID: [name], male    DOB: 1986-04-22, 27 y.o.   MRN: [id number]  HPI   [name] is a paraplegic who has on chronic decub ulcers ot left leg s/p skin graft but then developed sepsis from cellulitis, he was discharged from hospital at end of August with vancomycin for the past 30-34 days, then switched to clindamycin and cipro 2 wks ago due to elevated inflammatory markers.he reports a ground level fall injurying left upper leg.  Left leg lateral supramalleolus lesion has a beefy wound bed with light whitish exudate which he treats with silver algenate pad dressing changes QOD. He also applies it to lesion between 1st and 2nd toe of left foot nad some mild ulceration on dorsum of foot. Right foot also has some maceration between 1st and 2nd toe for which he applies dressing to as well. He was last seen at [facility] this week who have placed him in a multipodus boot in addition to Silver algenate with every dressing change. They see him next in November as well as seeing neurology for increased spasisticity of lower extremities.   During dressing change, the left leg looks worse than 2 weeks ago. The wound bed has heavy purulent base, some surrounding erythema. Lateral upper lesion is 9cm long by 5 cm wide. Distal lateral lesion is 3.5cm long by 3 cm with eschar in the central wound bed, then there appears to be a new skin tear closer to the heal 8 cm long by < 1cm wide. Lesions between toes is unchanged also with heavy exudate.  He has not been to wound care in the last 3 wks  Current Outpatient Prescriptions on File Prior to Visit  Medication Sig Dispense Refill  . baclofen (LIORESAL) 10 MG tablet Take 10 mg by mouth 3 (three) times daily.       . ciprofloxacin (CIPRO) 750 MG tablet Take 1 tablet (750 mg total) by mouth 2 (two) times daily.  28 tablet  0  . citalopram (CELEXA) 20 MG  tablet Take 20 mg by mouth at bedtime.      . clindamycin (CLEOCIN) 300 MG capsule Take 1 capsule (300 mg total) by mouth 4 (four) times daily.  120 capsule  2  . diazepam (VALIUM) 10 MG tablet Take 1 tablet (10 mg total) by mouth at bedtime.  30 tablet    . fexofenadine (ALLEGRA) 180 MG tablet Take 180 mg by mouth at bedtime.       . hydrochlorothiazide (HYDRODIURIL) 25 MG tablet Take 1 tablet (25 mg total) by mouth daily.      Marland Kitchen imipramine (TOFRANIL) 50 MG tablet Take 50 mg by mouth at bedtime.       Marland Kitchen oxybutynin (DITROPAN XL) 15 MG 24 hr tablet Take 30 mg by mouth at bedtime.        No current facility-administered medications on file prior to visit.   Active Ambulatory Problems    Diagnosis Date Noted  . Decubitus ulcer of left leg 04/27/2012  . 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 History of DVT (deep vein thrombosis) 05/01/2012  . 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 Leg fracture, right 05/01/2012  . Lower paraplegia   . Spinal cord injury of T10 vertebra   . History of DVT of lower extremity   . Leukocytosis 06/26/2012  . Infected decubitus ulcer 06/26/2012  .  Allergy to meropenem 02/27/2013  . Lower extremity cellulitis 07/16/2013  . Sepsis 07/16/2013  . Acute renal failure 07/16/2013   Resolved Ambulatory Problems    Diagnosis Date Noted  . No Resolved Ambulatory Problems   Past Medical History  Diagnosis Date  . Seasonal allergies   . Mild acid reflux WATCHES DIET  . Urge incontinence   . Spastic neurogenic bladder   . Self-catheterizes urinary bladder 4 TO 6 TIMES DAILY AND PRN  . Leg ulcer, left   . OSA on CPAP MODERATE  . Wheelchair bound    History  Substance Use Topics  . Smoking status: Former Smoker -- 0.25 packs/day for 4 years    Quit date: 02/08/2012  . Smokeless tobacco: Former Neurosurgeon    Types: Snuff, Chew     Comment: quit 2013  . Alcohol Use: Yes     Comment: OCCASIONAL      Review of Systems 12 point ros is negative other  than having increased spacitiy of lower extremities as well as some increased exudate of leg wound    Objective:   Physical Exam BP 114/72  Pulse 119  Temp(Src) 98.6 F (37 C) (Oral) Physical Exam  Constitutional: He is oriented to person, place, and time. He appears well-developed and well-nourished. No distress.  HENT:  Mouth/Throat: Oropharynx is clear and moist. No oropharyngeal exudate.  Cardiovascular: Normal rate, regular rhythm and normal heart sounds. Exam reveals no gallop and no friction rub.  No murmur heard.  Pulmonary/Chest: Effort normal and breath sounds normal. No respiratory distress. He has no wheezes.  Abdominal: Soft. Bowel sounds are normal. He exhibits no distension. There is no tenderness.  Lymphadenopathy:  He has no cervical adenopathy.  Neurological: lower extremity weakness paralysis with spasticity Skin: Skin is warm and dry. No rash noted. No erythema. See description in hpi Psychiatric: He has a normal mood and affect. His behavior is normal.       Assessment & Plan:  Deep tissue wound = return in 2 wks. Continue with cipro and clindamycin for now. He is to see wound care in the net 5-7 days for recs and see if needs surgical debridement. i have asked if they can do tissue culture for AFB. He may need to return back to IV antibiotics to determine need at next visit. i seriously question his diligence to his wound care or direction he is getting from wound care clinic. We will organize home health to do wound care for the next 4-6 wks.

## 2013-09-11 ENCOUNTER — Encounter (HOSPITAL_COMMUNITY): Payer: Self-pay | Admitting: Emergency Medicine

## 2013-09-11 ENCOUNTER — Inpatient Hospital Stay (HOSPITAL_COMMUNITY)
Admission: EM | Admit: 2013-09-11 | Discharge: 2013-09-20 | DRG: 500 | Disposition: A | Discharge status: Home / Self Care | Payer: PRIVATE HEALTH INSURANCE | Attending: Internal Medicine | Admitting: Internal Medicine

## 2013-09-11 DIAGNOSIS — G4733 Obstructive sleep apnea (adult) (pediatric): Secondary | ICD-10-CM | POA: Diagnosis present

## 2013-09-11 DIAGNOSIS — N319 Neuromuscular dysfunction of bladder, unspecified: Secondary | ICD-10-CM | POA: Diagnosis present

## 2013-09-11 DIAGNOSIS — L03119 Cellulitis of unspecified part of limb: Secondary | ICD-10-CM | POA: Diagnosis present

## 2013-09-11 DIAGNOSIS — N179 Acute kidney failure, unspecified: Secondary | ICD-10-CM | POA: Diagnosis present

## 2013-09-11 DIAGNOSIS — L89899 Pressure ulcer of other site, unspecified stage: Secondary | ICD-10-CM

## 2013-09-11 DIAGNOSIS — Z9089 Acquired absence of other organs: Secondary | ICD-10-CM

## 2013-09-11 DIAGNOSIS — Z833 Family history of diabetes mellitus: Secondary | ICD-10-CM

## 2013-09-11 DIAGNOSIS — G8220 Paraplegia, unspecified: Secondary | ICD-10-CM

## 2013-09-11 DIAGNOSIS — A419 Sepsis, unspecified organism: Secondary | ICD-10-CM

## 2013-09-11 DIAGNOSIS — K219 Gastro-esophageal reflux disease without esophagitis: Secondary | ICD-10-CM | POA: Diagnosis present

## 2013-09-11 DIAGNOSIS — Z86718 Personal history of other venous thrombosis and embolism: Secondary | ICD-10-CM

## 2013-09-11 DIAGNOSIS — L8993 Pressure ulcer of unspecified site, stage 3: Secondary | ICD-10-CM | POA: Diagnosis present

## 2013-09-11 DIAGNOSIS — IMO0002 Reserved for concepts with insufficient information to code with codable children: Secondary | ICD-10-CM | POA: Diagnosis present

## 2013-09-11 DIAGNOSIS — D75839 Thrombocytosis, unspecified: Secondary | ICD-10-CM | POA: Diagnosis present

## 2013-09-11 DIAGNOSIS — E860 Dehydration: Secondary | ICD-10-CM | POA: Diagnosis present

## 2013-09-11 DIAGNOSIS — D473 Essential (hemorrhagic) thrombocythemia: Secondary | ICD-10-CM | POA: Diagnosis present

## 2013-09-11 DIAGNOSIS — Z993 Dependence on wheelchair: Secondary | ICD-10-CM

## 2013-09-11 DIAGNOSIS — D72829 Elevated white blood cell count, unspecified: Secondary | ICD-10-CM | POA: Diagnosis present

## 2013-09-11 DIAGNOSIS — L02419 Cutaneous abscess of limb, unspecified: Secondary | ICD-10-CM

## 2013-09-11 DIAGNOSIS — L97509 Non-pressure chronic ulcer of other part of unspecified foot with unspecified severity: Secondary | ICD-10-CM | POA: Diagnosis present

## 2013-09-11 DIAGNOSIS — L02619 Cutaneous abscess of unspecified foot: Secondary | ICD-10-CM | POA: Diagnosis present

## 2013-09-11 DIAGNOSIS — Z882 Allergy status to sulfonamides status: Secondary | ICD-10-CM

## 2013-09-11 DIAGNOSIS — Z880 Allergy status to penicillin: Secondary | ICD-10-CM

## 2013-09-11 DIAGNOSIS — M869 Osteomyelitis, unspecified: Secondary | ICD-10-CM

## 2013-09-11 DIAGNOSIS — N390 Urinary tract infection, site not specified: Secondary | ICD-10-CM | POA: Diagnosis present

## 2013-09-11 DIAGNOSIS — D509 Iron deficiency anemia, unspecified: Secondary | ICD-10-CM | POA: Diagnosis present

## 2013-09-11 DIAGNOSIS — S24103D Unspecified injury at T7-T10 level of thoracic spinal cord, subsequent encounter: Secondary | ICD-10-CM

## 2013-09-11 DIAGNOSIS — S24103A Unspecified injury at T7-T10 level of thoracic spinal cord, initial encounter: Secondary | ICD-10-CM

## 2013-09-11 DIAGNOSIS — Z87891 Personal history of nicotine dependence: Secondary | ICD-10-CM

## 2013-09-11 DIAGNOSIS — D649 Anemia, unspecified: Secondary | ICD-10-CM

## 2013-09-11 DIAGNOSIS — L089 Local infection of the skin and subcutaneous tissue, unspecified: Secondary | ICD-10-CM | POA: Diagnosis present

## 2013-09-11 DIAGNOSIS — L8990 Pressure ulcer of unspecified site, unspecified stage: Secondary | ICD-10-CM

## 2013-09-11 DIAGNOSIS — Z9081 Acquired absence of spleen: Secondary | ICD-10-CM

## 2013-09-11 DIAGNOSIS — L0390 Cellulitis, unspecified: Secondary | ICD-10-CM

## 2013-09-11 LAB — CBC WITH DIFFERENTIAL/PLATELET
Basophils Absolute: 0.1 10*3/uL (ref 0.0–0.1)
Basophils Relative: 0 % (ref 0–1)
Eosinophils Absolute: 0.4 10*3/uL (ref 0.0–0.7)
Eosinophils Relative: 2 % (ref 0–5)
HCT: 29.4 % — ABNORMAL LOW (ref 39.0–52.0)
Hemoglobin: 10 g/dL — ABNORMAL LOW (ref 13.0–17.0)
Lymphocytes Relative: 13 % (ref 12–46)
Lymphs Abs: 2.4 10*3/uL (ref 0.7–4.0)
MCH: 25.6 pg — ABNORMAL LOW (ref 26.0–34.0)
MCHC: 34 g/dL (ref 30.0–36.0)
MCV: 75.4 fL — ABNORMAL LOW (ref 78.0–100.0)
Monocytes Absolute: 2 10*3/uL — ABNORMAL HIGH (ref 0.1–1.0)
Monocytes Relative: 10 % (ref 3–12)
Neutro Abs: 14.2 10*3/uL — ABNORMAL HIGH (ref 1.7–7.7)
Neutrophils Relative %: 75 % (ref 43–77)
Platelets: 792 10*3/uL — ABNORMAL HIGH (ref 150–400)
RBC: 3.9 MIL/uL — ABNORMAL LOW (ref 4.22–5.81)
RDW: 14.2 % (ref 11.5–15.5)
WBC: 19 10*3/uL — ABNORMAL HIGH (ref 4.0–10.5)

## 2013-09-11 LAB — URINALYSIS, ROUTINE W REFLEX MICROSCOPIC
Bilirubin Urine: NEGATIVE
Glucose, UA: NEGATIVE mg/dL
Ketones, ur: NEGATIVE mg/dL
Nitrite: NEGATIVE
Protein, ur: NEGATIVE mg/dL
Specific Gravity, Urine: 1.009 (ref 1.005–1.030)
Urobilinogen, UA: 0.2 mg/dL (ref 0.0–1.0)
pH: 5.5 (ref 5.0–8.0)

## 2013-09-11 LAB — BASIC METABOLIC PANEL
BUN: 50 mg/dL — ABNORMAL HIGH (ref 6–23)
CO2: 23 mEq/L (ref 19–32)
Calcium: 8.6 mg/dL (ref 8.4–10.5)
Chloride: 96 mEq/L (ref 96–112)
Creatinine, Ser: 6.97 mg/dL — ABNORMAL HIGH (ref 0.50–1.35)
GFR calc Af Amer: 12 mL/min — ABNORMAL LOW (ref >90)
GFR calc non Af Amer: 10 mL/min — ABNORMAL LOW (ref >90)
Glucose, Bld: 122 mg/dL — ABNORMAL HIGH (ref 70–99)
Potassium: 3.5 mEq/L (ref 3.5–5.1)
Sodium: 135 mEq/L (ref 135–145)

## 2013-09-11 LAB — URINE MICROSCOPIC-ADD ON

## 2013-09-11 MED ORDER — DIAZEPAM 5 MG PO TABS
10.0000 mg | ORAL_TABLET | Freq: Every day | ORAL | Status: DC
  Administered 2013-09-11 – 2013-09-19 (×9): 10 mg via ORAL
  Filled 2013-09-11 (×9): qty 2

## 2013-09-11 MED ORDER — BACLOFEN 10 MG PO TABS
10.0000 mg | ORAL_TABLET | Freq: Three times a day (TID) | ORAL | Status: DC | PRN
  Administered 2013-09-18: 10 mg via ORAL
  Filled 2013-09-11 (×3): qty 1

## 2013-09-11 MED ORDER — SODIUM CHLORIDE 0.9 % IV BOLUS (SEPSIS)
1000.0000 mL | Freq: Once | INTRAVENOUS | Status: AC
  Administered 2013-09-11: 1000 mL via INTRAVENOUS

## 2013-09-11 MED ORDER — NYSTATIN 100000 UNIT/ML MT SUSP
5.0000 mL | Freq: Four times a day (QID) | OROMUCOSAL | Status: DC
  Administered 2013-09-11 – 2013-09-20 (×32): 500000 [IU] via ORAL
  Filled 2013-09-11 (×38): qty 5

## 2013-09-11 MED ORDER — SODIUM CHLORIDE 0.9 % IV SOLN
INTRAVENOUS | Status: DC
  Administered 2013-09-11 – 2013-09-20 (×15): via INTRAVENOUS

## 2013-09-11 MED ORDER — HEPARIN SODIUM (PORCINE) 5000 UNIT/ML IJ SOLN
5000.0000 [IU] | Freq: Three times a day (TID) | INTRAMUSCULAR | Status: DC
  Administered 2013-09-11 – 2013-09-20 (×24): 5000 [IU] via SUBCUTANEOUS
  Filled 2013-09-11 (×29): qty 1

## 2013-09-11 MED ORDER — IMIPRAMINE HCL 50 MG PO TABS
50.0000 mg | ORAL_TABLET | Freq: Every day | ORAL | Status: DC
  Administered 2013-09-12 – 2013-09-19 (×9): 50 mg via ORAL
  Filled 2013-09-11 (×10): qty 1

## 2013-09-11 MED ORDER — ACETAMINOPHEN 650 MG RE SUPP: 650.0000 mg | Freq: Four times a day (QID) | RECTAL | Status: DC | PRN

## 2013-09-11 MED ORDER — ONDANSETRON HCL 4 MG PO TABS: 4.0000 mg | ORAL_TABLET | Freq: Four times a day (QID) | ORAL | Status: DC | PRN

## 2013-09-11 MED ORDER — OXYBUTYNIN CHLORIDE ER 15 MG PO TB24
30.0000 mg | ORAL_TABLET | Freq: Every day | ORAL | Status: DC
  Administered 2013-09-11 – 2013-09-19 (×10): 30 mg via ORAL
  Filled 2013-09-11 (×11): qty 2

## 2013-09-11 MED ORDER — VANCOMYCIN HCL 10 G IV SOLR
2500.0000 mg | Freq: Once | INTRAVENOUS | Status: AC
  Administered 2013-09-11: 2500 mg via INTRAVENOUS
  Filled 2013-09-11 (×2): qty 2500

## 2013-09-11 MED ORDER — ALBUTEROL SULFATE (5 MG/ML) 0.5% IN NEBU: 2.5000 mg | INHALATION_SOLUTION | RESPIRATORY_TRACT | Status: DC | PRN

## 2013-09-11 MED ORDER — ONDANSETRON HCL 4 MG/2ML IJ SOLN: 4.0000 mg | Freq: Four times a day (QID) | INTRAMUSCULAR | Status: DC | PRN

## 2013-09-11 MED ORDER — CITALOPRAM HYDROBROMIDE 40 MG PO TABS
40.0000 mg | ORAL_TABLET | Freq: Every day | ORAL | Status: DC
  Administered 2013-09-11 – 2013-09-19 (×10): 40 mg via ORAL
  Filled 2013-09-11 (×11): qty 1

## 2013-09-11 MED ORDER — ACETAMINOPHEN 325 MG PO TABS: 650.0000 mg | ORAL_TABLET | Freq: Four times a day (QID) | ORAL | Status: DC | PRN
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
## 2013-09-11 NOTE — ED Notes (Signed)
Presents with bilateral lower leg redness, swelling and warmth. Left leg wound to lateral aspect lower leg, +foul odor and bloody drainage, wound to left toes with +purulent discharge and odor, wound to right leg. Oral antibiotic therapy at home, just finished cipro and currently taking clindamycin. Pt is wheelchair bound. With no sensation to legs but states, they feel different than they have and look worse. Left leg wound presents since 2008.

## 2013-09-11 NOTE — ED Notes (Signed)
Pt has bilateral wound foot with oedema and discharge and feels warmth and reddish in color.
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
## 2013-09-11 NOTE — Progress Notes (Signed)
Unit CM UR Completed by [facility] ED CM  W. Daisa Stennis RN  

## 2013-09-12 ENCOUNTER — Inpatient Hospital Stay (HOSPITAL_COMMUNITY): Payer: PRIVATE HEALTH INSURANCE

## 2013-09-12 ENCOUNTER — Encounter (HOSPITAL_COMMUNITY): Payer: Self-pay | Admitting: *Deleted

## 2013-09-12 DIAGNOSIS — L0390 Cellulitis, unspecified: Secondary | ICD-10-CM

## 2013-09-12 DIAGNOSIS — L0291 Cutaneous abscess, unspecified: Secondary | ICD-10-CM

## 2013-09-12 DIAGNOSIS — D649 Anemia, unspecified: Secondary | ICD-10-CM

## 2013-09-12 DIAGNOSIS — D72829 Elevated white blood cell count, unspecified: Secondary | ICD-10-CM

## 2013-09-12 DIAGNOSIS — G8220 Paraplegia, unspecified: Secondary | ICD-10-CM

## 2013-09-12 LAB — CBC
HCT: 26.1 % — ABNORMAL LOW (ref 39.0–52.0)
Hemoglobin: 8.8 g/dL — ABNORMAL LOW (ref 13.0–17.0)
MCH: 25.4 pg — ABNORMAL LOW (ref 26.0–34.0)
MCHC: 33.7 g/dL (ref 30.0–36.0)
MCV: 75.4 fL — ABNORMAL LOW (ref 78.0–100.0)
Platelets: 707 10*3/uL — ABNORMAL HIGH (ref 150–400)
RBC: 3.46 MIL/uL — ABNORMAL LOW (ref 4.22–5.81)
RDW: 14.3 % (ref 11.5–15.5)
WBC: 16.6 10*3/uL — ABNORMAL HIGH (ref 4.0–10.5)

## 2013-09-12 LAB — COMPREHENSIVE METABOLIC PANEL
ALT: 12 U/L (ref 0–53)
AST: 16 U/L (ref 0–37)
Albumin: 2.7 g/dL — ABNORMAL LOW (ref 3.5–5.2)
Alkaline Phosphatase: 94 U/L (ref 39–117)
BUN: 48 mg/dL — ABNORMAL HIGH (ref 6–23)
CO2: 24 mEq/L (ref 19–32)
Calcium: 7.7 mg/dL — ABNORMAL LOW (ref 8.4–10.5)
Chloride: 101 mEq/L (ref 96–112)
Creatinine, Ser: 6.52 mg/dL — ABNORMAL HIGH (ref 0.50–1.35)
GFR calc Af Amer: 13 mL/min — ABNORMAL LOW (ref >90)
GFR calc non Af Amer: 11 mL/min — ABNORMAL LOW (ref >90)
Glucose, Bld: 110 mg/dL — ABNORMAL HIGH (ref 70–99)
Potassium: 3.5 mEq/L (ref 3.5–5.1)
Sodium: 138 mEq/L (ref 135–145)
Total Bilirubin: 0.2 mg/dL — ABNORMAL LOW (ref 0.3–1.2)
Total Protein: 7.9 g/dL (ref 6.0–8.3)

## 2013-09-12 LAB — CREATININE, URINE, RANDOM: Creatinine, Urine: 76.51 mg/dL

## 2013-09-12 LAB — MRSA PCR SCREENING: MRSA by PCR: POSITIVE — AB

## 2013-09-12 LAB — SODIUM, URINE, RANDOM: Sodium, Ur: 33 mEq/L

## 2013-09-12 MED ORDER — MUPIROCIN 2 % EX OINT
1.0000 "application " | TOPICAL_OINTMENT | Freq: Two times a day (BID) | CUTANEOUS | Status: AC
  Administered 2013-09-12 – 2013-09-16 (×10): 1 via NASAL
  Filled 2013-09-12 (×2): qty 22

## 2013-09-12 MED ORDER — PNEUMOCOCCAL VAC POLYVALENT 25 MCG/0.5ML IJ INJ: 0.5000 mL | INJECTION | INTRAMUSCULAR | Status: DC

## 2013-09-12 MED ORDER — INFLUENZA VAC SPLIT QUAD 0.5 ML IM SUSP: 0.5000 mL | INTRAMUSCULAR | Status: DC

## 2013-09-12 MED ORDER — CHLORHEXIDINE GLUCONATE CLOTH 2 % EX PADS
6.0000 | MEDICATED_PAD | Freq: Every day | CUTANEOUS | Status: AC
  Administered 2013-09-12 – 2013-09-16 (×5): 6 via TOPICAL
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
## 2013-09-12 NOTE — Progress Notes (Addendum)
admit  To  Room  6[address] 03 Orientation  To  Room  Call bell  Safety plan Mental  Status alert and  oriented Living arrangements  Home   Uses  wheelchair Skin/  lesion between 1st and 2nd toe of left foot with  some mild ulceration on dorsum of foot. Right foot also has some maceration between 1st and 2nd toe /bilateral legs, left >right swelling, redness, drainage from wounds   Family  In attendance   girlfriend Assessment  See  Doc flow  sheet
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
## 2013-09-12 NOTE — Progress Notes (Signed)
Patient ID: [name]  male  AVW:[id number]    DOB: 22-Aug-1986    DOA: 09/11/2013  PCP: Default, Provider, MD  Assessment/Plan:  Principal problem  Infected decubitus ulcers, lower extremity wounds with history of paraplegia following spinal cord injury - Patient has bilateral lower extremity ulcers on both feet and draining large amount from the left leg, states that his fiance was doing dressings at home, he follows wound care at [facility], although appears to be noncompliant and not receiving good home wound therapy.  - Discussed with wound care at [facility], I have discussed with Dr. Kelly Splinter (plastic surgery) who is aware of the patient from previous treatments, will follow patient for further recommendations - ID following, continue vancomycin, - Ordered bilateral foot x-rays to rule out any osteomyelitis   Active Problems: Acute renal failure: Unclear etiology, possibly worsened due to antibiotics, infection, dehydration, HCTZ/ARB - Will continue hydrating with IV fluids, patient is making urine, renal ultrasound for further workup. - If no significant improvement, will consult with renal     Spinal cord injury of T10 vertebra    History of DVT of lower extremity - He has a history of IVC filter    Leukocytosis and thrombocytosis - Improving likely due to lower extremity wounds infection and cellulitis  Anemia: Possibly due to anemia of chronic disease, hemodilution due to IV fluids - Continue to monitor closely    DVT Prophylaxis:  Code Status:  Disposition:    Subjective: Patient voices no complaints, still making urine, no fevers or chills, was able to give history by himself  Objective: Weight change:   Intake/Output Summary (Last 24 hours) at 09/12/13 1234 Last data filed at 09/12/13 0900  Gross per 24 hour  Intake    480 ml  Output   1800 ml  Net  -1320 ml   Blood pressure 101/48, pulse 101, temperature 98.6 F (37 C), temperature source Oral, resp.  rate 20, height 5\' 10"  (1.778 m), weight 110.5 kg (243 lb 9.7 oz), SpO2 98.00%.  Physical Exam: General: Alert and awake, oriented x3, not in any acute distress. CVS: S1-S2 clear, no murmur rubs or gallops Chest: clear to auscultation bilaterally,  Abdomen: soft nontender, nondistended, normal bowel sounds  Extremities: Left leg wound with large amount of drainage, worse open wound lateral malleolus, erythema, first and second toes with open ulcer and drainage, or edema + Neuro: Paraplegic  Lab Results: Basic Metabolic Panel:  Recent Labs Lab 09/11/13 1628 09/12/13 0600  NA 135 138  K 3.5 3.5  CL 96 101  CO2 23 24  GLUCOSE 122* 110*  BUN 50* 48*  CREATININE 6.97* 6.52*  CALCIUM 8.6 7.7*   Liver Function Tests:  Recent Labs Lab 09/12/13 0600  AST 16  ALT 12  ALKPHOS 94  BILITOT 0.2*  PROT 7.9  ALBUMIN 2.7*   No results found for this basename: LIPASE, AMYLASE,  in the last 168 hours No results found for this basename: AMMONIA,  in the last 168 hours CBC:  Recent Labs Lab 09/11/13 1628 09/12/13 0600  WBC 19.0* 16.6*  NEUTROABS 14.2*  --   HGB 10.0* 8.8*  HCT 29.4* 26.1*  MCV 75.4* 75.4*  PLT 792* 707*   Cardiac Enzymes: No results found for this basename: CKTOTAL, CKMB, CKMBINDEX, TROPONINI,  in the last 168 hours BNP: No components found with this basename: POCBNP,  CBG: No results found for this basename: GLUCAP,  in the last 168 hours   Micro Results:  Recent Results (from the past 240 hour(s))  MRSA PCR SCREENING     Status: Abnormal   Collection Time    09/11/13 11:46 PM      Result Value Range Status   MRSA by PCR POSITIVE (*) NEGATIVE Final   Comment:            The GeneXpert MRSA Assay (FDA     approved for NASAL specimens     only), is one component of a     comprehensive MRSA colonization     surveillance program. It is not     intended to diagnose MRSA     infection nor to guide or     monitor treatment for     MRSA infections.      RESULT CALLED TO, READ BACK BY AND VERIFIED WITH:     CALLED TO RN Elsie Lincoln [id number] @0353  THANEY    Studies/Results: No results found.  Medications: Scheduled Meds: . Chlorhexidine Gluconate Cloth  6 each Topical Q0600  . citalopram  40 mg Oral QHS  . diazepam  10 mg Oral QHS  . heparin  5,000 Units Subcutaneous Q8H  . imipramine  50 mg Oral QHS  . mupirocin ointment  1 application Nasal BID  . nystatin  5 mL Oral QID  . oxybutynin  30 mg Oral QHS      LOS: 1 day   Aymee Fomby M.D. [facility] 09/12/2013, 12:34 PM Pager: [phone]  If 7PM-7AM, please contact night-coverage [email or web address] Password [email or web address]

## 2013-09-12 NOTE — Consult Note (Signed)
WOC wound consult note Reason for Consult: Consult requested for left leg wound and bilat feet.  Pt states he has been followed by [facility] for outpatient wound care and completed Hyperbaric treatments.  He has had numerous topical treatments and skin grafts to this chronic site and everything was healing well until 2 weeks ago, he states.  It began to drain a large amt of tan fluid and appearance declined rapidly. He states his girlfriend changes his dressings at home. Wound type: Full thickness to left posterior leg.  Pt is paralyzed and has no feeling.  Generalized edema to LLE, warm to touch with good pedal pulses. Measurement:17X4X.5cm, 100% fluctuant slough, strong odor, large amt yellow drainage. Left toes 1-2 with dry crusted drainage.  Attempted to loosen and scrub off but site began to bleed since it is tightly adhered.  5X6cm area affected; in between toes swab inserted to 1 cm and bone palpable.  Mod yellow drainage, strong odor. Right toes 1-3 with dry crusted drainage. 3X3X.5cm area affected; dark red wound bed when crusted area cleaned and removed.  in between toes swab inserted to 1 cm and bone palpable. Mod yellow drainage, strong odor.  These wounds are beyond WOC scope of practice; recommend consult to Plastics team or ortho.  Pt is familiar with Dr Kelly Splinter of the [facility] wound care center from previous treatments.  Wounds could benefit from extensive sharp debridement of non-viable tissue.  Discussed plan of care with primary team and she plans to arrange referral.   Dressing procedure/placement/frequency: Damp gauze dressing until further input available from plastics team. Aquacel to right toe space wound to absorb drainage and provide antimicrobial benefits.  Pt could benefit from X-ray to bilat feet to R/O osteomyelitis since bone is palpable.  Please re-consult if further assistance is needed.  Thank-you,  Cammie Mcgee MSN, RN, CWOCN, [place], CNS [phone]

## 2013-09-13 ENCOUNTER — Encounter (HOSPITAL_COMMUNITY): Payer: Self-pay | Admitting: Physician Assistant

## 2013-09-13 DIAGNOSIS — Z5189 Encounter for other specified aftercare: Secondary | ICD-10-CM

## 2013-09-13 DIAGNOSIS — IMO0002 Reserved for concepts with insufficient information to code with codable children: Secondary | ICD-10-CM

## 2013-09-13 DIAGNOSIS — A419 Sepsis, unspecified organism: Secondary | ICD-10-CM

## 2013-09-13 DIAGNOSIS — M869 Osteomyelitis, unspecified: Secondary | ICD-10-CM

## 2013-09-13 LAB — BASIC METABOLIC PANEL
BUN: 46 mg/dL — ABNORMAL HIGH (ref 6–23)
CO2: 21 mEq/L (ref 19–32)
Calcium: 7.7 mg/dL — ABNORMAL LOW (ref 8.4–10.5)
Chloride: 107 mEq/L (ref 96–112)
Creatinine, Ser: 5.66 mg/dL — ABNORMAL HIGH (ref 0.50–1.35)
GFR calc Af Amer: 15 mL/min — ABNORMAL LOW (ref >90)
GFR calc non Af Amer: 13 mL/min — ABNORMAL LOW (ref >90)
Glucose, Bld: 78 mg/dL (ref 70–99)
Potassium: 4 mEq/L (ref 3.5–5.1)
Sodium: 139 mEq/L (ref 135–145)

## 2013-09-13 LAB — SURGICAL PCR SCREEN
MRSA, PCR: POSITIVE — AB
Staphylococcus aureus: POSITIVE — AB

## 2013-09-13 LAB — URINE CULTURE: Colony Count: >=100000

## 2013-09-13 LAB — CBC
HCT: 22.9 % — ABNORMAL LOW (ref 39.0–52.0)
Hemoglobin: 7.5 g/dL — ABNORMAL LOW (ref 13.0–17.0)
MCH: 25.3 pg — ABNORMAL LOW (ref 26.0–34.0)
MCHC: 32.8 g/dL (ref 30.0–36.0)
MCV: 77.1 fL — ABNORMAL LOW (ref 78.0–100.0)
Platelets: 606 10*3/uL — ABNORMAL HIGH (ref 150–400)
RBC: 2.97 MIL/uL — ABNORMAL LOW (ref 4.22–5.81)
RDW: 14.4 % (ref 11.5–15.5)
WBC: 13.8 10*3/uL — ABNORMAL HIGH (ref 4.0–10.5)

## 2013-09-13 LAB — ABO/RH: ABO/RH(D): B POS

## 2013-09-13 LAB — VANCOMYCIN, RANDOM: Vancomycin Rm: 20.6 ug/mL

## 2013-09-13 LAB — PREPARE RBC (CROSSMATCH)

## 2013-09-13 MED ORDER — VANCOMYCIN HCL 10 G IV SOLR
1500.0000 mg | Freq: Once | INTRAVENOUS | Status: DC
  Filled 2013-09-13: qty 1500

## 2013-09-13 NOTE — Consult Note (Signed)
WOC follow-up: Pt now followed by plastics team.  Please refer to them for further plan of care. Please re-consult if further assistance is needed.  Thank-you,  Cammie Mcgee MSN, RN, CWOCN, [place], CNS [phone]
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
## 2013-09-13 NOTE — Progress Notes (Signed)
Patient ID: [name]  male  ZOX:[id number]    DOB: December 31, 1985    DOA: 09/11/2013  PCP: Default, Provider, MD  Assessment/Plan:  Principal problem  Infected decubitus ulcers, lower extremity wounds with history of paraplegia following spinal cord injury, or stomatitis in the left foot - Patient has bilateral lower extremity ulcers on both feet and draining large amount from the left leg, states that his fiance was doing dressings at home, he follows wound care at [facility], although appears to be noncompliant and not receiving good home wound therapy.  - Appreciate plastic surgery in the ID following, will likely need debridement in OR and intraoperative cultures   Active Problems: Acute renal failure: Unclear etiology, possibly worsened due to antibiotics, infection, dehydration, HCTZ/ARB, improving - Will continue IV fluids, patient is making urine, renal ultrasound negative for any hydronephrosis    Spinal cord injury of T10 vertebra    History of DVT of lower extremity -  has a history of IVC filter    Leukocytosis and thrombocytosis - Improving likely due to lower extremity wounds infection and cellulitis  Anemia: Possibly due to anemia of chronic disease, hemodilution due to IV fluids - Continue to monitor closely, will transfuse 2 units packed RBC, patient also needs surgery tomorrow  DVT Prophylaxis:  Code Status:  Disposition:    Subjective: No complaints, wounds wrapped  Objective: Weight change: -4.6 kg (-10 lb 2.3 oz)  Intake/Output Summary (Last 24 hours) at 09/13/13 1517 Last data filed at 09/13/13 1300  Gross per 24 hour  Intake   6115 ml  Output   2650 ml  Net   3465 ml   Blood pressure 107/50, pulse 78, temperature 98.6 F (37 C), temperature source Oral, resp. rate 18, height 5\' 10"  (1.778 m), weight 114.2 kg (251 lb 12.3 oz), SpO2 96.00%.  Physical Exam: General: Alert and awake, oriented x3, not in any acute distress. CVS: S1-S2 clear, no  murmur rubs or gallops Chest: clear to auscultation bilaterally,  Abdomen: soft nontender, nondistended, normal bowel sounds  Extremities: wounds wrapped today  Neuro: Paraplegic  Lab Results: Basic Metabolic Panel:  Recent Labs Lab 09/12/13 0600 09/13/13 0430  NA 138 139  K 3.5 4.0  CL 101 107  CO2 24 21  GLUCOSE 110* 78  BUN 48* 46*  CREATININE 6.52* 5.66*  CALCIUM 7.7* 7.7*   Liver Function Tests:  Recent Labs Lab 09/12/13 0600  AST 16  ALT 12  ALKPHOS 94  BILITOT 0.2*  PROT 7.9  ALBUMIN 2.7*   No results found for this basename: LIPASE, AMYLASE,  in the last 168 hours No results found for this basename: AMMONIA,  in the last 168 hours CBC:  Recent Labs Lab 09/11/13 1628 09/12/13 0600 09/13/13 0430  WBC 19.0* 16.6* 13.8*  NEUTROABS 14.2*  --   --   HGB 10.0* 8.8* 7.5*  HCT 29.4* 26.1* 22.9*  MCV 75.4* 75.4* 77.1*  PLT 792* 707* 606*   Cardiac Enzymes: No results found for this basename: CKTOTAL, CKMB, CKMBINDEX, TROPONINI,  in the last 168 hours BNP: No components found with this basename: POCBNP,  CBG: No results found for this basename: GLUCAP,  in the last 168 hours   Micro Results: Recent Results (from the past 240 hour(s))  URINE CULTURE     Status: None   Collection Time    09/11/13  7:40 PM      Result Value Range Status   Specimen Description URINE, CATHETERIZED   Final  Special Requests NONE   Final   Culture  Setup Time     Final   Value: 09/11/2013 22:03     Performed at [facility] Count     Final   Value: >=100,000 COLONIES/ML     Performed at [facility]   Culture     Final   Value: ACINETOBACTER CALCOACETICUS/BAUMANNII COMPLEX     Performed at [facility]   Report Status 09/13/2013 FINAL   Final   Organism ID, Bacteria ACINETOBACTER CALCOACETICUS/BAUMANNII COMPLEX   Final  MRSA PCR SCREENING     Status: Abnormal   Collection Time    09/11/13 11:46 PM      Result Value Range Status    MRSA by PCR POSITIVE (*) NEGATIVE Final   Comment:            The GeneXpert MRSA Assay (FDA     approved for NASAL specimens     only), is one component of a     comprehensive MRSA colonization     surveillance program. It is not     intended to diagnose MRSA     infection nor to guide or     monitor treatment for     MRSA infections.     RESULT CALLED TO, READ BACK BY AND VERIFIED WITH:     CALLED TO RN Elsie Lincoln [id number] @0353  THANEY    Studies/Results: No results found.  Medications: Scheduled Meds: . Chlorhexidine Gluconate Cloth  6 each Topical Q0600  . citalopram  40 mg Oral QHS  . diazepam  10 mg Oral QHS  . heparin  5,000 Units Subcutaneous Q8H  . imipramine  50 mg Oral QHS  . mupirocin ointment  1 application Nasal BID  . nystatin  5 mL Oral QID  . oxybutynin  30 mg Oral QHS      LOS: 2 days   RAI,RIPUDEEP M.D. [facility] 09/13/2013, 3:17 PM Pager: [phone]  If 7PM-7AM, please contact night-coverage [email or web address] Password [email or web address]

## 2013-09-13 NOTE — Progress Notes (Signed)
ANTIBIOTIC CONSULT NOTE - FOLLOW UP  Pharmacy Consult for Vancomycin Indication: B/L LE foot/leg ulcers with L-toe osteomyelitis  Allergies  Allergen Reactions  . Sulfa Antibiotics Hives  . Levofloxacin Rash  . Meropenem Rash  . Penicillins Rash  . Zyvox [Linezolid] Rash    Patient Measurements: Height: 5\' 10"  (177.8 cm) Weight: 251 lb 12.3 oz (114.2 kg) IBW/kg (Calculated) : 73  Vital Signs: Temp: 99.2 F (37.3 C) (10/23 0621) Temp src: Oral (10/23 0621) BP: 107/59 mmHg (10/23 0621) Pulse Rate: 73 (10/23 0621) Intake/Output from previous day: 10/22 0701 - 10/23 0700 In: 6115 [P.O.:960; I.V.:5155] Out: 3100 [Urine:3100] Intake/Output from this shift:    Labs:  Recent Labs  09/11/13 1628 09/11/13 2158 09/12/13 0600 09/13/13 0430  WBC 19.0*  --  16.6* 13.8*  HGB 10.0*  --  8.8* 7.5*  PLT 792*  --  707* 606*  LABCREA  --  76.51  --   --   CREATININE 6.97*  --  6.52* 5.66*   Estimated Creatinine Clearance: 25.5 ml/min (by C-G formula based on Cr of 5.66).  Recent Labs  09/13/13 0430  VANCORANDOM 20.6     Microbiology: Recent Results (from the past 720 hour(s))  URINE CULTURE     Status: None   Collection Time    09/11/13  7:40 PM      Result Value Range Status   Specimen Description URINE, CATHETERIZED   Final   Special Requests NONE   Final   Culture  Setup Time     Final   Value: 09/11/2013 22:03     Performed at [facility] Count     Final   Value: >=100,000 COLONIES/ML     Performed at [facility]   Culture     Final   Value: GRAM NEGATIVE RODS     Performed at [facility]   Report Status PENDING   Incomplete  MRSA PCR SCREENING     Status: Abnormal   Collection Time    09/11/13 11:46 PM      Result Value Range Status   MRSA by PCR POSITIVE (*) NEGATIVE Final   Comment:            The GeneXpert MRSA Assay (FDA     approved for NASAL specimens     only), is one component of a     comprehensive MRSA  colonization     surveillance program. It is not     intended to diagnose MRSA     infection nor to guide or     monitor treatment for     MRSA infections.     RESULT CALLED TO, READ BACK BY AND VERIFIED WITH:     CALLED TO RN Elsie Lincoln [phone] @0353  THANEY    Anti-infectives   Start     Dose/Rate Route Frequency Ordered Stop   09/13/13 2000  vancomycin (VANCOCIN) 1,500 mg in sodium chloride 0.9 % 500 mL IVPB     1,500 mg 250 mL/hr over 120 Minutes Intravenous  Once 09/13/13 0957     09/11/13 1745  vancomycin (VANCOCIN) 2,500 mg in sodium chloride 0.9 % 500 mL IVPB     2,500 mg 250 mL/hr over 120 Minutes Intravenous  Once 09/11/13 1741 09/11/13 1931      Assessment: 27 y.o. M with paraplegia who presented to the [facility] on 09/11/13 with complaints of bilateral leg swelling and wound drainage. The patient has a known history of  leg wounds -- previously treated with IV Vancomycin (dose of 2g/24h) and then transitioned to oral Clindamycin/Cipro approximately 2 weeks ago. Foot x-rays this admit show L-toe osteo and per ID, the patient is to continue Vancomycin for 6 weeks.  On admission the patient was noted to be in acute renal failure (admit SCr 6.97, baseline ~1?). The patient was loaded with Vancomycin 2500 mg x 1 dose at 1800 on 10/21. The patient's renal function is noted to be improving, SCr 5.66 << 6.52, CrCl is hard to estimate at this time given the patient's concurrent paraplegia, UOP/24h 1.1 ml/kg/hr.  A random Vancomycin level this morning was 20.6 mcg/ml which indicates some clearance -- would expect the patient to trend down to the 17-18 mcg/ml range by this evening, so will plan to re-dose Vancomycin at that time.  Will not enter standing Vancomycin doses for now given the patient's acute kidney injury. Will plan to recheck another random level to evaluate clearance in the next 24-48 hours based on renal recovery.  Goal of Therapy:  Vancomycin trough level 15-20  mcg/ml  Plan:  1. Vancomycin 1500 mg x 1 dose at 2000 today 2. Will continue to follow improvement in renal function, random levels, and need for additional doses.  Georgina Pillion, PharmD, BCPS Clinical Pharmacist Pager: [phone] 09/13/2013 10:33 AM

## 2013-09-13 NOTE — Progress Notes (Signed)
Patient ID: [name], male   DOB: Nov 25, 1985, 25 y.o.   MRN: [id number]  Plan: Vancomycin for 6 weeks  Assessment: Right and left feet and left leg ulcer infections, with osteomyelitis of the the left second toe   Subjectively: He says he is doing well. He has had no complaints of fever, chills or sweats. In addition, wound dressings have been applied regularly. He says the redness and swelling in the ulcers have decreased slightly.    Objectively: Last recorded: 10/23 0621   BP: 107/59 Pulse: 73  Temp: 99.2 F (37.3 C) Resp: 18  SpO2: 99     CVS: S1+S2+0 Abdomen: soft, non tender, gs +ve Resp: normal vesicular breathing, no added sounds Other exams normal   Selected Labs (Up to last 3 results from past 72 hours) Report       10/21 1628   10/22 0600   10/23 0430      WBC 19.0   16.6   13.8      RBC 3.90   3.46   2.97      Hemoglobin 10.0   8.8   7.5      HCT 29.4   26.1   22.9      Platelets 792   707   606      Sodium 135  138  139     Potassium 3.5  3.5  4.0     Chloride 96  101  107     CO2 23  24  21      BUN 50   48   46      Creatinine 6.97   6.52   5.66      Calcium 8.6  7.7   7.7      Glucose 122   110   78     10/23 GFR: 13   10/22 DG left foot IMPRESSION:  Bone destruction involving the mid and distal phalanges of the left  2nd toe compatible with osteomyelitis.   DG right foot IMPRESSION:  No radiographic changes of osteomyelitis.  Urine culture [[id number]] Collected: 09/11/13 1940 Updated: 09/12/13 1723 Specimen Description URINE, CATHETERIZED Special Requests NONE Culture Setup Time - Result: 09/11/2013 22:03 Performed at [facility] [place] Count - Result: >=100,000 COLONIES/ML Performed at [facility] Culture - Result: GRAM NEGATIVE RODS Performed at [facility] Report Status PENDING  MRSA PCR Screening [[id number]] (Abnormal) Collected: 09/11/13 2346 Updated: 09/12/13 0353 Specimen Type: Nasopharyngeal Specimen  Source: Nasal Mucosa MRSA by PCR POSITIVE (A)   [place] 10/22 IMPRESSION:  No hydronephrosis.  Urine microscopic-add on [[id number]] (Abnormal) Collected: 09/11/13 1940 Updated: 09/11/13 2009 Squamous Epithelial / LPF FEW (A) WBC, UA 7-10 WBC/hpf RBC / HPF 0-2 RBC/hpf Bacteria, UA FEW (A)   Urinalysis, Routine w reflex microscopic [[id number]] (Abnormal) Collected: 09/11/13 1940 Updated: 09/11/13 2009 Specimen Type: Urine Specimen Source: Urine, Catheterized Color, Urine YELLOW APPearance HAZY (A) Specific Gravity, Urine 1.009 pH 5.5 Glucose, UA NEGATIVE mg/dL Hgb urine dipstick SMALL (A) Bilirubin Urine NEGATIVE Ketones, ur NEGATIVE mg/dL Protein, ur NEGATIVE mg/dL Urobilinogen, UA 0.2 mg/dL Nitrite NEGATIVE Leukocytes, UA MODERATE (A)

## 2013-09-13 NOTE — Progress Notes (Signed)
[facility]  Date of Admission:  09/11/2013  Antibiotics: Antibiotics Given (last 72 hours)   None      Subjective: No complaints, osteo in left foot  Objective: Temp:  [98.4 F (36.9 C)-99.2 F (37.3 C)] 98.8 F (37.1 C) (10/23 0900) Pulse Rate:  [72-88] 76 (10/23 0900) Resp:  [18] 18 (10/23 0621) BP: (107-118)/(50-62) 110/61 mmHg (10/23 0900) SpO2:  [95 %-99 %] 98 % (10/23 0900) Weight:  [251 lb 12.3 oz (114.2 kg)] 251 lb 12.3 oz (114.2 kg) (10/22 2110)  General: Awake, alert Skin: left leg wrapped Lungs: cta Cor: RRR Abdomen: soft, nt, nd Ext: no edema, foot wrapped, oozing, discharge of left  Lab Results Lab Results  Component Value Date   WBC 13.8* 09/13/2013   HGB 7.5* 09/13/2013   HCT 22.9* 09/13/2013   MCV 77.1* 09/13/2013   PLT 606* 09/13/2013    Lab Results  Component Value Date   CREATININE 5.66* 09/13/2013   BUN 46* 09/13/2013   NA 139 09/13/2013   K 4.0 09/13/2013   CL 107 09/13/2013   CO2 21 09/13/2013    Lab Results  Component Value Date   ALT 12 09/12/2013   AST 16 09/12/2013   ALKPHOS 94 09/12/2013   BILITOT 0.2* 09/12/2013      Microbiology: Recent Results (from the past 240 hour(s))  URINE CULTURE     Status: None   Collection Time    09/11/13  7:40 PM      Result Value Range Status   Specimen Description URINE, CATHETERIZED   Final   Special Requests NONE   Final   Culture  Setup Time     Final   Value: 09/11/2013 22:03     Performed at [facility] Count     Final   Value: >=100,000 COLONIES/ML     Performed at [facility]   Culture     Final   Value: GRAM NEGATIVE RODS     Performed at [facility]   Report Status PENDING   Incomplete  MRSA PCR SCREENING     Status: Abnormal   Collection Time    09/11/13 11:46 PM      Result Value Range Status   MRSA by PCR POSITIVE (*) NEGATIVE Final   Comment:            The GeneXpert MRSA Assay (FDA     approved  for NASAL specimens     only), is one component of a     comprehensive MRSA colonization     surveillance program. It is not     intended to diagnose MRSA     infection nor to guide or     monitor treatment for     MRSA infections.     RESULT CALLED TO, READ BACK BY AND VERIFIED WITH:     CALLED TO RN Elsie Lincoln [id number] @0353  THANEY    Studies/Results: US Renal  09/12/2013   CLINICAL DATA:  Acute renal failure  EXAM: RENAL/URINARY TRACT ULTRASOUND COMPLETE  COMPARISON:  None.  FINDINGS: Right Kidney  Length: 11.4 cm. Echogenicity within normal limits. No mass or hydronephrosis visualized.  Left Kidney  Length: 12.6 cm. Echogenicity within normal limits. No mass or hydronephrosis visualized.  Bladder  Not visualized; decompressed by Foley catheter.  IMPRESSION: No hydronephrosis.   Electronically Signed   By: Charline Bills M.D.   On: 09/12/2013 13:42   Dg Foot  Complete Left  09/12/2013   CLINICAL DATA:  Soft tissue wounds and drainage. Rule out osteomyelitis.  EXAM: LEFT FOOT - COMPLETE 3+ VIEW  COMPARISON:  04/18/2012  FINDINGS: The mid and distal phalanx of the left 2nd toe did not well visualized, likely due tube bone destruction/osteomyelitis. No other visible changes of acute osteomyelitis.  IMPRESSION: Bone destruction involving the mid and distal phalanges of the left 2nd toe compatible with osteomyelitis.   Electronically Signed   By: Charlett Nose M.D.   On: 09/12/2013 20:24   Dg Foot Complete Right  09/12/2013   CLINICAL DATA:  Drainage, question osteomyelitis  EXAM: RIGHT FOOT COMPLETE - 3+ VIEW  COMPARISON:  None.  FINDINGS: No visible radiographic changes of osteomyelitis. No fracture. No subluxation or dislocation. Mild osteopenia.  IMPRESSION: No radiographic changes of osteomyelitis.   Electronically Signed   By: Charlett Nose M.D.   On: 09/12/2013 20:25    Assessment/Plan: 1)  Osteomyelitis - being seen by plastic surgery and may get debridement.  I will hold antibiotics  pending surgery to maximize chance of getting a positive intraoperative culture.     Staci Righter, MD [email or web address] [phone] pager   [phone] cell 09/13/2013, 1:27 PM

## 2013-09-14 ENCOUNTER — Encounter (HOSPITAL_COMMUNITY)
Admission: EM | Disposition: A | Discharge status: Home / Self Care | Payer: Self-pay | Source: Home / Self Care | Attending: Internal Medicine

## 2013-09-14 ENCOUNTER — Encounter (HOSPITAL_COMMUNITY): Payer: PRIVATE HEALTH INSURANCE | Admitting: Anesthesiology

## 2013-09-14 ENCOUNTER — Inpatient Hospital Stay (HOSPITAL_COMMUNITY): Payer: PRIVATE HEALTH INSURANCE | Admitting: Anesthesiology

## 2013-09-14 DIAGNOSIS — B9689 Other specified bacterial agents as the cause of diseases classified elsewhere: Secondary | ICD-10-CM

## 2013-09-14 DIAGNOSIS — N390 Urinary tract infection, site not specified: Secondary | ICD-10-CM

## 2013-09-14 DIAGNOSIS — M869 Osteomyelitis, unspecified: Principal | ICD-10-CM

## 2013-09-14 HISTORY — PX: AMPUTATION: SHX166

## 2013-09-14 HISTORY — PX: I & D EXTREMITY: SHX5045

## 2013-09-14 LAB — CBC
HCT: 26.3 % — ABNORMAL LOW (ref 39.0–52.0)
Hemoglobin: 8.8 g/dL — ABNORMAL LOW (ref 13.0–17.0)
MCH: 26 pg (ref 26.0–34.0)
MCHC: 33.5 g/dL (ref 30.0–36.0)
MCV: 77.8 fL — ABNORMAL LOW (ref 78.0–100.0)
Platelets: 657 10*3/uL — ABNORMAL HIGH (ref 150–400)
RBC: 3.38 MIL/uL — ABNORMAL LOW (ref 4.22–5.81)
RDW: 14.6 % (ref 11.5–15.5)
WBC: 11.9 10*3/uL — ABNORMAL HIGH (ref 4.0–10.5)

## 2013-09-14 LAB — BASIC METABOLIC PANEL
BUN: 38 mg/dL — ABNORMAL HIGH (ref 6–23)
CO2: 24 mEq/L (ref 19–32)
Calcium: 8 mg/dL — ABNORMAL LOW (ref 8.4–10.5)
Chloride: 107 mEq/L (ref 96–112)
Creatinine, Ser: 4.81 mg/dL — ABNORMAL HIGH (ref 0.50–1.35)
GFR calc Af Amer: 18 mL/min — ABNORMAL LOW (ref >90)
GFR calc non Af Amer: 16 mL/min — ABNORMAL LOW (ref >90)
Glucose, Bld: 68 mg/dL — ABNORMAL LOW (ref 70–99)
Potassium: 4.4 mEq/L (ref 3.5–5.1)
Sodium: 143 mEq/L (ref 135–145)

## 2013-09-14 SURGERY — IRRIGATION AND DEBRIDEMENT EXTREMITY
Anesthesia: General | Site: Toe | Laterality: Left | Wound class: Dirty or Infected

## 2013-09-14 MED ORDER — LIDOCAINE HCL (CARDIAC) 20 MG/ML IV SOLN
INTRAVENOUS | Status: DC | PRN
  Administered 2013-09-14: 40 mg via INTRAVENOUS

## 2013-09-14 MED ORDER — FENTANYL CITRATE 0.05 MG/ML IJ SOLN
INTRAMUSCULAR | Status: DC | PRN
  Administered 2013-09-14: 25 ug via INTRAVENOUS
  Administered 2013-09-14: 150 ug via INTRAVENOUS

## 2013-09-14 MED ORDER — ONDANSETRON HCL 4 MG/2ML IJ SOLN
INTRAMUSCULAR | Status: DC | PRN
  Administered 2013-09-14: 4 mg via INTRAVENOUS

## 2013-09-14 MED ORDER — MIDAZOLAM HCL 5 MG/5ML IJ SOLN
INTRAMUSCULAR | Status: DC | PRN
  Administered 2013-09-14: 2 mg via INTRAVENOUS

## 2013-09-14 MED ORDER — METRONIDAZOLE 500 MG PO TABS
500.0000 mg | ORAL_TABLET | Freq: Four times a day (QID) | ORAL | Status: DC
  Administered 2013-09-14 – 2013-09-20 (×23): 500 mg via ORAL
  Filled 2013-09-14 (×30): qty 1

## 2013-09-14 MED ORDER — PROPOFOL 10 MG/ML IV BOLUS
INTRAVENOUS | Status: DC | PRN
  Administered 2013-09-14: 280 mg via INTRAVENOUS

## 2013-09-14 MED ORDER — SODIUM CHLORIDE 0.9 % IV SOLN
300.0000 mg | Freq: Two times a day (BID) | INTRAVENOUS | Status: DC
  Administered 2013-09-14 – 2013-09-20 (×13): 300 mg via INTRAVENOUS
  Filled 2013-09-14 (×14): qty 300

## 2013-09-14 MED ORDER — SODIUM CHLORIDE 0.9 % IR SOLN
Status: DC | PRN
  Administered 2013-09-14: 1000 mL

## 2013-09-14 SURGICAL SUPPLY — 50 items
BANDAGE ELASTIC 4 VELCRO ST LF (GAUZE/BANDAGES/DRESSINGS) IMPLANT
BANDAGE GAUZE ELAST BULKY 4 IN (GAUZE/BANDAGES/DRESSINGS) ×2 IMPLANT
BLADE SURG 10 STRL SS (BLADE) ×1 IMPLANT
BLADE SURG ROTATE 9660 (MISCELLANEOUS) IMPLANT
CANISTER SUCTION 2500CC (MISCELLANEOUS) ×3 IMPLANT
CANISTER WOUND CARE 500ML ATS (WOUND CARE) ×4 IMPLANT
CHLORAPREP W/TINT 26ML (MISCELLANEOUS) IMPLANT
CLOTH BEACON ORANGE TIMEOUT ST (SAFETY) ×3 IMPLANT
COLLECTOR WOUND DRAINAGE MED (WOUND CARE) ×1 IMPLANT
COVER SURGICAL LIGHT HANDLE (MISCELLANEOUS) ×3 IMPLANT
DRAPE EXTREMITY T 121X128X90 (DRAPE) IMPLANT
DRAPE LAPAROSCOPIC ABDOMINAL (DRAPES) ×3 IMPLANT
DRAPE ORTHO SPLIT 77X108 STRL (DRAPES)
DRAPE SURG ORHT 6 SPLT 77X108 (DRAPES) IMPLANT
DRAPE UTILITY 15X26 W/TAPE STR (DRAPE) ×6 IMPLANT
DRSG PAD ABDOMINAL 8X10 ST (GAUZE/BANDAGES/DRESSINGS) IMPLANT
DRSG VAC ATS LRG SENSATRAC (GAUZE/BANDAGES/DRESSINGS) IMPLANT
DRSG VAC ATS MED SENSATRAC (GAUZE/BANDAGES/DRESSINGS) ×3 IMPLANT
DRSG VAC ATS SM SENSATRAC (GAUZE/BANDAGES/DRESSINGS) ×3 IMPLANT
DRSG VERSA FOAM LRG 10X15 (GAUZE/BANDAGES/DRESSINGS) IMPLANT
ELECT REM PT RETURN 9FT ADLT (ELECTROSURGICAL) ×3
ELECTRODE REM PT RTRN 9FT ADLT (ELECTROSURGICAL) ×2 IMPLANT
GAUZE XEROFORM 5X9 LF (GAUZE/BANDAGES/DRESSINGS) ×2 IMPLANT
GLOVE BIO SURGEON STRL SZ 6.5 (GLOVE) ×3 IMPLANT
GLOVE BIO SURGEON STRL SZ7.5 (GLOVE) ×3 IMPLANT
GLOVE BIOGEL PI IND STRL 6.5 (GLOVE) IMPLANT
GLOVE BIOGEL PI IND STRL 7.0 (GLOVE) IMPLANT
GLOVE BIOGEL PI IND STRL 7.5 (GLOVE) IMPLANT
GLOVE BIOGEL PI IND STRL 8 (GLOVE) ×2 IMPLANT
GLOVE BIOGEL PI INDICATOR 6.5 (GLOVE) ×1
GLOVE BIOGEL PI INDICATOR 7.0 (GLOVE) ×1
GLOVE BIOGEL PI INDICATOR 7.5 (GLOVE) ×1
GLOVE BIOGEL PI INDICATOR 8 (GLOVE) ×2
GOWN STRL NON-REIN LRG LVL3 (GOWN DISPOSABLE) ×6 IMPLANT
GOWN STRL REIN XL XLG (GOWN DISPOSABLE) ×3 IMPLANT
HANDPIECE INTERPULSE COAX TIP (DISPOSABLE)
KIT BASIN OR (CUSTOM PROCEDURE TRAY) ×3 IMPLANT
KIT ROOM TURNOVER OR (KITS) ×3 IMPLANT
NS IRRIG 1000ML POUR BTL (IV SOLUTION) ×3 IMPLANT
PACK GENERAL/GYN (CUSTOM PROCEDURE TRAY) ×3 IMPLANT
PAD ARMBOARD 7.5X6 YLW CONV (MISCELLANEOUS) ×6 IMPLANT
SET HNDPC FAN SPRY TIP SCT (DISPOSABLE) IMPLANT
SPONGE ABDOMINAL VAC ABTHERA (MISCELLANEOUS) IMPLANT
SPONGE GAUZE 4X4 12PLY (GAUZE/BANDAGES/DRESSINGS) ×2 IMPLANT
SUCTION POOLE TIP (SUCTIONS) ×3 IMPLANT
TOWEL OR 17X24 6PK STRL BLUE (TOWEL DISPOSABLE) ×3 IMPLANT
TOWEL OR 17X26 10 PK STRL BLUE (TOWEL DISPOSABLE) ×3 IMPLANT
UNDERPAD 30X30 INCONTINENT (UNDERPADS AND DIAPERS) ×3 IMPLANT
VICRYL 3-0 27" X-1 REVERSE CUTTING NEEDLE ×1 IMPLANT
WATER STERILE IRR 1000ML POUR (IV SOLUTION) IMPLANT
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
## 2013-09-14 NOTE — Op Note (Signed)
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 Selective debridement right foot 5 cm2 2. Excisional debridement of left leg including skin, subcutaneous, muscle, fascia 45 cm2 3. Left 2nd toe amputation 3. Application wound VAC left leg  SURGEON: Glenna Fellows MD MBA  ASSISTANT: none  ANESTHESIA:  General.   EBL:  COMPLICATIONS: None.   INDICATIONS FOR PROCEDURE:  The patient, [name], is a 27 y.o. male born on Apr 07, 1986, is here for treatment of pressure ulcerations BLE    DESCRIPTION OF PROCEDURE:  The patient's operative site was marked with the patient in the preoperative area. The patient was taken to the operating room. SCDs were placed and IV antibiotics were given. The patient's operative site was prepped and draped in a sterile fashion. A time out was performed and all information was confirmed to be correct.  Examination of right foot with adherent alginate dressing. Curette and scissors were required to remove this adherent dressing. Remaining open area represented partial thickness skin wound with largely granulated base. Curette used to remove superficial slough for wound of 1st webspace of 2 x 3 x 0.1 cm dimensions. Irrigated and dressed with Xeroform and dry dressing.  Left lateral leg wound represented area of previous skin grafting. Knife used to tangentially excise necrotic tissue until bleeding tissue encountered. Wound irrigated and hemostasis obtained.   Incision made at base of left 2nd toe. Able to express gross fluid from toe. Phalanges excised with knife and rongeur to smooth hard metatarsal head. Wound irrigated. Patient has had long standing  ulcer along 1st and 2nd webspaces. This surrounding skin vas viable but without epithelium and was approximated over amputation site with interrupted 3-0 vicryl sutures.   VAC applied to left lateral leg wound with bridge, black foam and set to 125 mmHg mercury. Xeroform and dry dressing to foot.   The patient was allowed to wake from anesthesia, extubated and taken to the recovery room in satisfactory condition.   SPECIMENS: left 2nd toe  DRAINS: wound VAC

## 2013-09-14 NOTE — Anesthesia Postprocedure Evaluation (Signed)
  Anesthesia Post-op Note  Patient: [name]  Procedure(s) Performed: Procedure(s): DEBRIDEMENT OF BILATERAL LOWER LEGS EXTRIMITIES (Bilateral) AMPUTATION OF LEFT SECOND TOE WITH PLACEMENT OF VAC (Left)  Patient Location: PACU  Anesthesia Type:General  Level of Consciousness: awake, alert , oriented and patient cooperative  Airway and Oxygen Therapy: Patient Spontanous Breathing  Post-op Pain: none  Post-op Assessment: Post-op Vital signs reviewed, Patient's Cardiovascular Status Stable, Respiratory Function Stable, Patent Airway, No signs of Nausea or vomiting and Pain level controlled  Post-op Vital Signs: Reviewed and stable  Complications: No apparent anesthesia complications

## 2013-09-14 NOTE — Preoperative (Signed)
Beta Blockers   Reason not to administer Beta Blockers:Not Applicable 

## 2013-09-14 NOTE — Transfer of Care (Signed)
Immediate Anesthesia Transfer of Care Note  Patient: [name]  Procedure(s) Performed: Procedure(s): DEBRIDEMENT OF BILATERAL LOWER LEGS EXTRIMITIES (Bilateral) AMPUTATION OF LEFT SECOND TOE WITH PLACEMENT OF VAC (Left)  Patient Location: PACU  Anesthesia Type:General  Level of Consciousness: awake, alert  and oriented  Airway & Oxygen Therapy: Patient Spontanous Breathing and Patient connected to nasal cannula oxygen  Post-op Assessment: Report given to PACU RN and Post -op Vital signs reviewed and stable  Post vital signs: Reviewed and stable  Complications: No apparent anesthesia complications
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
## 2013-09-14 NOTE — Progress Notes (Addendum)
[facility]  Date of Admission:  09/11/2013  Antibiotics: Antibiotics Given (last 72 hours)   None      Subjective: S/p operative debridement  Objective: Temp:  [97.8 F (36.6 C)-98.7 F (37.1 C)] 97.8 F (36.6 C) (10/24 1237) Pulse Rate:  [58-100] 86 (10/24 1237) Resp:  [16-20] 18 (10/24 1237) BP: (96-132)/(50-71) 126/62 mmHg (10/24 1237) SpO2:  [90 %-98 %] 92 % (10/24 1237) Weight:  [253 lb 15.5 oz (115.2 kg)] 253 lb 15.5 oz (115.2 kg) (10/23 2101)  General: Awake, alert Skin: vac placed Lungs: cta Cor: RRR Abdomen: soft, nt, nd Ext: no edema, foot wrapped, oozing, discharge of left  Lab Results Lab Results  Component Value Date   WBC 11.9* 09/14/2013   HGB 8.8* 09/14/2013   HCT 26.3* 09/14/2013   MCV 77.8* 09/14/2013   PLT 657* 09/14/2013    Lab Results  Component Value Date   CREATININE 4.81* 09/14/2013   BUN 38* 09/14/2013   NA 143 09/14/2013   K 4.4 09/14/2013   CL 107 09/14/2013   CO2 24 09/14/2013    Lab Results  Component Value Date   ALT 12 09/12/2013   AST 16 09/12/2013   ALKPHOS 94 09/12/2013   BILITOT 0.2* 09/12/2013      Microbiology: Recent Results (from the past 240 hour(s))  URINE CULTURE     Status: None   Collection Time    09/11/13  7:40 PM      Result Value Range Status   Specimen Description URINE, CATHETERIZED   Final   Special Requests NONE   Final   Culture  Setup Time     Final   Value: 09/11/2013 22:03     Performed at [facility] Count     Final   Value: >=100,000 COLONIES/ML     Performed at [facility]   Culture     Final   Value: ACINETOBACTER CALCOACETICUS/BAUMANNII COMPLEX     Performed at [facility]   Report Status 09/13/2013 FINAL   Final   Organism ID, Bacteria ACINETOBACTER CALCOACETICUS/BAUMANNII COMPLEX   Final  MRSA PCR SCREENING     Status: Abnormal   Collection Time    09/11/13 11:46 PM      Result Value Range Status   MRSA by  PCR POSITIVE (*) NEGATIVE Final   Comment:            The GeneXpert MRSA Assay (FDA     approved for NASAL specimens     only), is one component of a     comprehensive MRSA colonization     surveillance program. It is not     intended to diagnose MRSA     infection nor to guide or     monitor treatment for     MRSA infections.     RESULT CALLED TO, READ BACK BY AND VERIFIED WITH:     CALLED TO RN Elsie Lincoln [phone] @0353  THANEY  SURGICAL PCR SCREEN     Status: Abnormal   Collection Time    09/13/13  8:33 PM      Result Value Range Status   MRSA, PCR POSITIVE (*) NEGATIVE Final   Comment: RESULT CALLED TO, READ BACK BY AND VERIFIED WITH:     N MURPHY,RN [id number] WILDERK   Staphylococcus aureus POSITIVE (*) NEGATIVE Final   Comment:            The Xpert SA  Assay (FDA     approved for NASAL specimens     in patients over 39 years of age),     is one component of     a comprehensive surveillance     program.  Test performance has     been validated by [employer] for patients greater     than or equal to 72 year old.     It is not intended     to diagnose infection nor to     guide or monitor treatment.    Studies/Results: Dg Foot Complete Left  09/12/2013   CLINICAL DATA:  Soft tissue wounds and drainage. Rule out osteomyelitis.  EXAM: LEFT FOOT - COMPLETE 3+ VIEW  COMPARISON:  04/18/2012  FINDINGS: The mid and distal phalanx of the left 2nd toe did not well visualized, likely due tube bone destruction/osteomyelitis. No other visible changes of acute osteomyelitis.  IMPRESSION: Bone destruction involving the mid and distal phalanges of the left 2nd toe compatible with osteomyelitis.   Electronically Signed   By: Charlett Nose M.D.   On: 09/12/2013 20:24   Dg Foot Complete Right  09/12/2013   CLINICAL DATA:  Drainage, question osteomyelitis  EXAM: RIGHT FOOT COMPLETE - 3+ VIEW  COMPARISON:  None.  FINDINGS: No visible radiographic changes of osteomyelitis. No fracture.  No subluxation or dislocation. Mild osteopenia.  IMPRESSION: No radiographic changes of osteomyelitis.   Electronically Signed   By: Charlett Nose M.D.   On: 09/12/2013 20:25    Assessment/Plan: 1)  Osteomyelitis - now with ampuation of toe.  I have started ceftaroline with metronidazole.  I would treat for a prolonged period of 6 weeks.   -will need adjustments as his kidneys improve.    2) Urine culture - Acinetobacter.  Self caths, asymptomatic so no indication to treat, c/w colonization  Staci Righter, MD [facility] [email or web address] [phone] pager   [phone] cell 09/14/2013, 1:46 PM

## 2013-09-14 NOTE — Progress Notes (Signed)
Patient ID: [name]  male  ZOX:[id number]    DOB: Nov 04, 1986    DOA: 09/11/2013  PCP: Default, Provider, MD  Assessment/Plan:  Principal problem  Infected decubitus ulcers, LE wounds with history of paraplegia following spinal cord injury, osteomyelitis in the left foot - Patient has bilateral lower extremity ulcers on both feet and draining large amount from the left leg, states that his fiance was doing dressings at home, he follows wound care at [facility], although appears to be noncompliant and not receiving good home wound therapy.  - Appreciate plastic surgery and ID following, debridement in OR today and intraoperative cultures   Active Problems: Acute renal failure: Unclear etiology, possibly worsened due to antibiotics, infection, dehydration, HCTZ/ARB, improving - Creatinine 4.8 today - Will continue IV fluids, patient is making urine, renal ultrasound negative for any hydronephrosis  Anemia: Possibly due to anemia of chronic disease, hemodilution due to IV fluids - Transfuse 2 units packed RBC on 1023, hemoglobin 8.8 today     Spinal cord injury of T10 vertebra    History of DVT of lower extremity -  has a history of IVC filter    Leukocytosis and thrombocytosis - Improving likely due to lower extremity wounds infection and cellulitis   DVT Prophylaxis:  Code Status:  Disposition:    Subjective: No complaints, wounds wrapped on the left foot, awaiting surgery, still second unit of blood transfusion going  Objective: Weight change: 1 kg (2 lb 3.3 oz)  Intake/Output Summary (Last 24 hours) at 09/14/13 0902 Last data filed at 09/14/13 0600  Gross per 24 hour  Intake   1140 ml  Output   3750 ml  Net  -2610 ml   Blood pressure 110/62, pulse 70, temperature 98.7 F (37.1 C), temperature source Oral, resp. rate 19, height 5\' 10"  (1.778 m), weight 115.2 kg (253 lb 15.5 oz), SpO2 96.00%.  Physical Exam: General: Ax O x3 CVS: S1-S2 clear, no murmur rubs or  gallops Chest: CTAB  Abdomen: soft NT, ND, NBS  Extremities: left foot wounds wrapped today  Neuro: Paraplegic  Lab Results: Basic Metabolic Panel:  Recent Labs Lab 09/13/13 0430 09/14/13 0522  NA 139 143  K 4.0 4.4  CL 107 107  CO2 21 24  GLUCOSE 78 68*  BUN 46* 38*  CREATININE 5.66* 4.81*  CALCIUM 7.7* 8.0*   Liver Function Tests:  Recent Labs Lab 09/12/13 0600  AST 16  ALT 12  ALKPHOS 94  BILITOT 0.2*  PROT 7.9  ALBUMIN 2.7*   No results found for this basename: LIPASE, AMYLASE,  in the last 168 hours No results found for this basename: AMMONIA,  in the last 168 hours CBC:  Recent Labs Lab 09/11/13 1628  09/13/13 0430 09/14/13 0522  WBC 19.0*  < > 13.8* 11.9*  NEUTROABS 14.2*  --   --   --   HGB 10.0*  < > 7.5* 8.8*  HCT 29.4*  < > 22.9* 26.3*  MCV 75.4*  < > 77.1* 77.8*  PLT 792*  < > 606* 657*  < > = values in this interval not displayed. Cardiac Enzymes: No results found for this basename: CKTOTAL, CKMB, CKMBINDEX, TROPONINI,  in the last 168 hours BNP: No components found with this basename: POCBNP,  CBG: No results found for this basename: GLUCAP,  in the last 168 hours   Micro Results: Recent Results (from the past 240 hour(s))  URINE CULTURE     Status: None   Collection Time  09/11/13  7:40 PM      Result Value Range Status   Specimen Description URINE, CATHETERIZED   Final   Special Requests NONE   Final   Culture  Setup Time     Final   Value: 09/11/2013 22:03     Performed at [facility] Count     Final   Value: >=100,000 COLONIES/ML     Performed at [facility]   Culture     Final   Value: ACINETOBACTER CALCOACETICUS/BAUMANNII COMPLEX     Performed at [facility]   Report Status 09/13/2013 FINAL   Final   Organism ID, Bacteria ACINETOBACTER CALCOACETICUS/BAUMANNII COMPLEX   Final  MRSA PCR SCREENING     Status: Abnormal   Collection Time    09/11/13 11:46 PM      Result Value Range  Status   MRSA by PCR POSITIVE (*) NEGATIVE Final   Comment:            The GeneXpert MRSA Assay (FDA     approved for NASAL specimens     only), is one component of a     comprehensive MRSA colonization     surveillance program. It is not     intended to diagnose MRSA     infection nor to guide or     monitor treatment for     MRSA infections.     RESULT CALLED TO, READ BACK BY AND VERIFIED WITH:     CALLED TO RN Elsie Lincoln [id number] @0353  THANEY  SURGICAL PCR SCREEN     Status: Abnormal   Collection Time    09/13/13  8:33 PM      Result Value Range Status   MRSA, PCR POSITIVE (*) NEGATIVE Final   Comment: RESULT CALLED TO, READ BACK BY AND VERIFIED WITH:     N MURPHY,RN [phone] WILDERK   Staphylococcus aureus POSITIVE (*) NEGATIVE Final   Comment:            The Xpert SA Assay (FDA     approved for NASAL specimens     in patients over 67 years of age),     is one component of     a comprehensive surveillance     program.  Test performance has     been validated by [employer] for patients greater     than or equal to 12 year old.     It is not intended     to diagnose infection nor to     guide or monitor treatment.    Studies/Results: No results found.  Medications: Scheduled Meds: . Chlorhexidine Gluconate Cloth  6 each Topical Q0600  . citalopram  40 mg Oral QHS  . diazepam  10 mg Oral QHS  . heparin  5,000 Units Subcutaneous Q8H  . imipramine  50 mg Oral QHS  . mupirocin ointment  1 application Nasal BID  . nystatin  5 mL Oral QID  . oxybutynin  30 mg Oral QHS      LOS: 3 days   Docie Abramovich M.D. [facility] 09/14/2013, 9:02 AM Pager: [phone]  If 7PM-7AM, please contact night-coverage [email or web address] Password [email or web address]

## 2013-09-14 NOTE — Brief Op Note (Signed)
09/11/2013 - 09/14/2013  12:14 PM  PATIENT:  [name]  27 y.o. male  PRE-OPERATIVE DIAGNOSIS:  BILATERAL WOUND OF LOWER EXTREMTIES  POST-OPERATIVE DIAGNOSIS:  BILATERAL WOUND OF LOWER EXTREMTIES  PROCEDURE:  Procedure(s): DEBRIDEMENT OF BILATERAL LOWER LEGS EXTRIMITIES (Bilateral) AMPUTATION OF LEFT SECOND TOE WITH PLACEMENT OF VAC (Left)  SURGEON:  Surgeon(s) and Role:    * Glenna Fellows, MD - Primary  PHYSICIAN ASSISTANT:   ASSISTANTS: none   ANESTHESIA:   general  EBL:  Total I/O In: 300 [I.V.:300] Out: -   BLOOD ADMINISTERED:none  DRAINS: wound VAC   LOCAL MEDICATIONS USED:  NONE  SPECIMEN:  Source of Specimen:  left 2nd toe  DISPOSITION OF SPECIMEN:  PATHOLOGY  COUNTS:  YES  TOURNIQUET:  * No tourniquets in log *  DICTATION: .Note written in EPIC  PLAN OF CARE: Admit to inpatient   PATIENT DISPOSITION:  PACU - hemodynamically stable.   Delay start of Pharmacological VTE agent (>24hrs) due to surgical blood loss or risk of bleeding: no

## 2013-09-14 NOTE — Progress Notes (Signed)
Patient ID: [name], male   DOB: 07-14-1986, 27 y.o.   MRN: [id number]  Plan: Continue vancomycin after planned debridement  Assessment: Right and left foot  And left leg ulcer infections + ostemyelitis of left second toe  Subjectively: He says he is doing well and has no significant complaints. He has no complaints of fever, chills, or sweats. The dressing has been applied regularly.    Objectively: Last recorded: 10/24 0615   BP: 108/64 Pulse: 59  Temp: 98.3 F (36.8 C) Resp: 18  CVS: S1+S2+0 Resp: normal vesicular breathing, no added sounds Abdomen: soft, non tender, gs +ve Other exams unremarkable   10/22 0600  10/23 0430  10/24 0522  WBC 16.6 13.8 11.9 RBC 3.46 2.97 3.38 Hemoglobin 8.8 7.5 8.8 HCT 26.1 22.9 26.3 Platelets 707 606 657 Sodium 138 139 Potassium 3.5 4.0 Chloride 101 107 CO2 24 21 BUN 48 46 Creatinine 6.52 5.66 Calcium 7.7 7.7 Glucose 110 78   Surgical pcr screen [[id number]] (Abnormal) Collected: 09/13/13 2033 Updated: 09/13/13 2349 Specimen Type: Nasal Swab Specimen Source: Nasal Mucosa MRSA, PCR POSITIVE (A) Staphylococcus aureus POSITIVE (A)  Urine culture [[id number]] Collected: 09/11/13 1940 Updated: 09/13/13 1423 Specimen Description URINE, CATHETERIZED Special Requests NONE Culture Setup Time - Result: 09/11/2013 22:03 Performed at [facility] [facility] Count - Result: >=100,000 COLONIES/ML Performed at [facility] Culture - Result: ACINETOBACTER CALCOACETICUS/BAUMANNII COMPLEX Performed at [facility] Report Status 09/13/2013 FINAL Organism ID, Bacteria Result: ACINETOBACTER CALCOACETICUS/BAUMANNII COMPLEX  Susceptible to: bactrim, tobramycin, gentamycin

## 2013-09-14 NOTE — Interval H&P Note (Signed)
History and Physical Interval Note:  09/14/2013 9:21 AM  [name]  has presented today for surgery, with the diagnosis of BILATERAL WOUND OF LOWER EXTREMITY  The various methods of treatment have been discussed with the patient and family. After consideration of risks, benefits and other options for treatment, the patient has consented to  Procedure(s): DEBRIDEMENT OF BILATERAL LOWER LEGS EXTRIMITIES (Bilateral) AMPUTATION OF LEFT SECOND TOE WITH PLACEMENT OF VAC (Left) as a surgical intervention .  The patient's history has been reviewed, patient examined, no change in status, stable for surgery.  I have reviewed the patient's chart and labs.  Questions were answered to the patient's satisfaction.     [name]

## 2013-09-15 LAB — TYPE AND SCREEN
ABO/RH(D): B POS
Antibody Screen: NEGATIVE
Unit division: 0
Unit division: 0

## 2013-09-15 LAB — BASIC METABOLIC PANEL
BUN: 32 mg/dL — ABNORMAL HIGH (ref 6–23)
CO2: 22 mEq/L (ref 19–32)
Calcium: 7.6 mg/dL — ABNORMAL LOW (ref 8.4–10.5)
Chloride: 107 mEq/L (ref 96–112)
Creatinine, Ser: 3.93 mg/dL — ABNORMAL HIGH (ref 0.50–1.35)
GFR calc Af Amer: 23 mL/min — ABNORMAL LOW (ref >90)
GFR calc non Af Amer: 20 mL/min — ABNORMAL LOW (ref >90)
Glucose, Bld: 97 mg/dL (ref 70–99)
Potassium: 4.5 mEq/L (ref 3.5–5.1)
Sodium: 141 mEq/L (ref 135–145)

## 2013-09-15 LAB — CBC
HCT: 27.8 % — ABNORMAL LOW (ref 39.0–52.0)
Hemoglobin: 9.2 g/dL — ABNORMAL LOW (ref 13.0–17.0)
MCH: 25.9 pg — ABNORMAL LOW (ref 26.0–34.0)
MCHC: 33.1 g/dL (ref 30.0–36.0)
MCV: 78.3 fL (ref 78.0–100.0)
Platelets: 646 10*3/uL — ABNORMAL HIGH (ref 150–400)
RBC: 3.55 MIL/uL — ABNORMAL LOW (ref 4.22–5.81)
RDW: 14.8 % (ref 11.5–15.5)
WBC: 13.4 10*3/uL — ABNORMAL HIGH (ref 4.0–10.5)

## 2013-09-15 LAB — SEDIMENTATION RATE: Sed Rate: 107 mm/hr — ABNORMAL HIGH (ref 0–16)

## 2013-09-15 LAB — C-REACTIVE PROTEIN: CRP: 5.9 mg/dL — ABNORMAL HIGH (ref <0.60)

## 2013-09-15 NOTE — Progress Notes (Signed)
[facility]  Date of Admission:  09/11/2013  Antibiotics: Antibiotics Given (last 72 hours)   Date/Time Action Medication Dose Rate   09/14/13 1641 Given   ceftaroline (TEFLARO) 300 mg in sodium chloride 0.9 % 250 mL IVPB 300 mg 250 mL/hr   09/14/13 1641 Given   metroNIDAZOLE (FLAGYL) tablet 500 mg 500 mg    09/14/13 2335 Given   metroNIDAZOLE (FLAGYL) tablet 500 mg 500 mg    09/15/13 1610 Given   ceftaroline (TEFLARO) 300 mg in sodium chloride 0.9 % 250 mL IVPB 300 mg 250 mL/hr   09/15/13 0701 Given   metroNIDAZOLE (FLAGYL) tablet 500 mg 500 mg    09/15/13 1240 Given   metroNIDAZOLE (FLAGYL) tablet 500 mg 500 mg       Subjective: Doing better. Afebrile.  Objective: Temp:  [97.7 F (36.5 C)-98.6 F (37 C)] 98.6 F (37 C) (10/25 0540) Pulse Rate:  [56-84] 81 (10/25 0540) Resp:  [18] 18 (10/25 0540) BP: (100-113)/(52-66) 108/66 mmHg (10/25 0540) SpO2:  [94 %-99 %] 98 % (10/25 0540)  General: Awake, alert Skin: vac placed Lungs: cta Cor: RRR Abdomen: soft, nt, nd Ext: no edema, foot wrapped, serous drainage from left foot bandage.   Lab Results Lab Results  Component Value Date   WBC 13.4* 09/15/2013   HGB 9.2* 09/15/2013   HCT 27.8* 09/15/2013   MCV 78.3 09/15/2013   PLT 646* 09/15/2013    Lab Results  Component Value Date   CREATININE 3.93* 09/15/2013   BUN 32* 09/15/2013   NA 141 09/15/2013   K 4.5 09/15/2013   CL 107 09/15/2013   CO2 22 09/15/2013    Lab Results  Component Value Date   ALT 12 09/12/2013   AST 16 09/12/2013   ALKPHOS 94 09/12/2013   BILITOT 0.2* 09/12/2013      Microbiology: Recent Results (from the past 240 hour(s))  URINE CULTURE     Status: None   Collection Time    09/11/13  7:40 PM      Result Value Range Status   Specimen Description URINE, CATHETERIZED   Final   Special Requests NONE   Final   Culture  Setup Time     Final   Value: 09/11/2013 22:03     Performed at [facility] Count     Final   Value: >=100,000 COLONIES/ML     Performed at [facility]   Culture     Final   Value: ACINETOBACTER CALCOACETICUS/BAUMANNII COMPLEX     Performed at [facility]   Report Status 09/13/2013 FINAL   Final   Organism ID, Bacteria ACINETOBACTER CALCOACETICUS/BAUMANNII COMPLEX   Final  MRSA PCR SCREENING     Status: Abnormal   Collection Time    09/11/13 11:46 PM      Result Value Range Status   MRSA by PCR POSITIVE (*) NEGATIVE Final   Comment:            The GeneXpert MRSA Assay (FDA     approved for NASAL specimens     only), is one component of a     comprehensive MRSA colonization     surveillance program. It is not     intended to diagnose MRSA     infection nor to guide or     monitor treatment for     MRSA infections.     RESULT CALLED TO, READ BACK BY AND VERIFIED  WITH:     CALLED TO RN Elsie Lincoln [phone] @0353  THANEY  SURGICAL PCR SCREEN     Status: Abnormal   Collection Time    09/13/13  8:33 PM      Result Value Range Status   MRSA, PCR POSITIVE (*) NEGATIVE Final   Comment: RESULT CALLED TO, READ BACK BY AND VERIFIED WITH:     N MURPHY,RN [phone] WILDERK   Staphylococcus aureus POSITIVE (*) NEGATIVE Final   Comment:            The Xpert SA Assay (FDA     approved for NASAL specimens     in patients over 79 years of age),     is one component of     a comprehensive surveillance     program.  Test performance has     been validated by [employer] for patients greater     than or equal to 63 year old.     It is not intended     to diagnose infection nor to     guide or monitor treatment.    Studies/Results: No results found.  Assessment/Plan: 1)  Osteomyelitis - now with ampuation of toe. started ceftaroline with metronidazole. treat for a prolonged period of 6 weeks.   -will need adjustments as his kidneys improve.    2) Urine culture - Acinetobacter.   Asymptomatic; colonization; no need to  treat  3) aki = continue with daily BMP, hopefully will continue to improve  Judyann Munson, MD [facility] [email or web address] [phone] pager   [phone]  09/15/2013, 1:03 PM

## 2013-09-15 NOTE — Progress Notes (Signed)
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  - Appreciate plastic surgery and ID following, status post debridement in the OR 10/24, wound VAC on, next change on 09/17/2013 per plastic surgery do not plan VAC upon discharge. - On teflaro and flagyl per ID for 6 weeks.  Will place PICC line once creatinine function has normalized    Active Problems: Acute renal failure:  possibly worsened due to antibiotics, infection, dehydration, HCTZ/ARB, improving - Creatinine 3.9 today - continue IV fluids, patient is making urine, renal ultrasound negative for any hydronephrosis  Anemia: Possibly due to anemia of chronic disease, hemodilution due to IV fluids - Transfuse 2 units packed RBC on 10/23    Spinal cord injury of T10 vertebra    History of DVT of lower extremity -  has a history of IVC filter    Leukocytosis and thrombocytosis  DVT Prophylaxis:  Code Status:  Disposition: Not medically ready    Subjective: No complaints, wounds wrapped on the left foot  Objective: Weight change:   Intake/Output Summary (Last 24 hours) at 09/15/13 1053 Last data filed at 09/15/13 0700  Gross per 24 hour  Intake   3550 ml  Output   4425 ml  Net   -875 ml   Blood pressure 108/66, pulse 81, temperature 98.6 F (37 C), temperature source Oral, resp. rate 18, height 5\' 10"  (1.778 m), weight 115.2 kg (253 lb 15.5 oz), SpO2 98.00%.  Physical Exam: General: Ax O x3 CVS: S1-S2 clear Chest: CTAB   Abdomen: soft NT, ND, NBS  Extremities: left foot wounds dressing intact, wound vac+ Neuro: Paraplegic  Lab Results: Basic Metabolic Panel:  Recent Labs Lab 09/14/13 0522 09/15/13 0602  NA 143 141  K 4.4 4.5  CL 107 107  CO2 24 22  GLUCOSE 68* 97  BUN 38* 32*  CREATININE 4.81* 3.93*  CALCIUM 8.0* 7.6*   Liver Function Tests:  Recent Labs Lab 09/12/13 0600  AST 16  ALT 12  ALKPHOS 94  BILITOT 0.2*  PROT 7.9  ALBUMIN 2.7*   No results found for this basename: LIPASE, AMYLASE,  in the last 168 hours No results found for this basename: AMMONIA,  in the last 168 hours CBC:  Recent Labs Lab 09/11/13 1628  09/14/13 0522 09/15/13 0602  WBC 19.0*  < > 11.9* 13.4*  NEUTROABS 14.2*  --   --   --   HGB 10.0*  < > 8.8* 9.2*  HCT 29.4*  < > 26.3* 27.8*  MCV 75.4*  < > 77.8* 78.3  PLT 792*  < > 657* 646*  < > = values in this interval not displayed. Cardiac Enzymes: No results found for this basename: CKTOTAL, CKMB, CKMBINDEX, TROPONINI,  in the last 168 hours BNP: No components found with this basename: POCBNP,  CBG: No results found for this basename: GLUCAP,  in the last 168 hours   Micro Results: Recent Results (from the past 240 hour(s))  URINE CULTURE     Status:  None   Collection Time    09/11/13  7:40 PM      Result Value Range Status   Specimen Description URINE, CATHETERIZED   Final   Special Requests NONE   Final   Culture  Setup Time     Final   Value: 09/11/2013 22:03     Performed at [facility] Count     Final   Value: >=100,000 COLONIES/ML     Performed at [facility]   Culture     Final   Value: ACINETOBACTER CALCOACETICUS/BAUMANNII COMPLEX     Performed at [facility]   Report Status 09/13/2013 FINAL   Final   Organism ID, Bacteria ACINETOBACTER CALCOACETICUS/BAUMANNII COMPLEX   Final  MRSA PCR SCREENING     Status: Abnormal   Collection Time    09/11/13 11:46 PM      Result Value Range Status    MRSA by PCR POSITIVE (*) NEGATIVE Final   Comment:            The GeneXpert MRSA Assay (FDA     approved for NASAL specimens     only), is one component of a     comprehensive MRSA colonization     surveillance program. It is not     intended to diagnose MRSA     infection nor to guide or     monitor treatment for     MRSA infections.     RESULT CALLED TO, READ BACK BY AND VERIFIED WITH:     CALLED TO RN Elsie Lincoln [phone] @0353  THANEY  SURGICAL PCR SCREEN     Status: Abnormal   Collection Time    09/13/13  8:33 PM      Result Value Range Status   MRSA, PCR POSITIVE (*) NEGATIVE Final   Comment: RESULT CALLED TO, READ BACK BY AND VERIFIED WITH:     Sandi Raveling [phone] WILDERK   Staphylococcus aureus POSITIVE (*) NEGATIVE Final   Comment:            The Xpert SA Assay (FDA     approved for NASAL specimens     in patients over 25 years of age),     is one component of     a comprehensive surveillance     program.  Test performance has     been validated by [employer] for patients greater     than or equal to 61 year old.     It is not intended     to diagnose infection nor to     guide or monitor treatment.    Studies/Results: No results found.  Medications: Scheduled Meds: . ceFTAROline (TEFLARO) IV  300 mg Intravenous Q12H  . Chlorhexidine Gluconate Cloth  6 each Topical Q0600  . citalopram  40 mg Oral QHS  . diazepam  10 mg Oral QHS  . heparin  5,000 Units Subcutaneous Q8H  . imipramine  50 mg Oral QHS  . metroNIDAZOLE  500 mg Oral Q6H  . mupirocin ointment  1 application Nasal BID  . nystatin  5 mL Oral QID  . oxybutynin  30 mg Oral QHS      LOS: 4 days   Arnell Mausolf M.D. [facility] 09/15/2013, 10:53 AM Pager: [phone]  If 7PM-7AM, please contact night-coverage [email or web address] Password [email or web address]

## 2013-09-15 NOTE — Progress Notes (Signed)
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 Patient reports he has boot to rotate left foot in order to off load lateral leg; counseled that if he is indeed using the boot all the time then he needs to have refitted.   Rec  -Low air loss mattress for home.  -Can continue VAC while in hospital, next VAC change 09/17/13. Do not plan VAC upon d/c. -Will change to wound care dressings upon d/c:   rec calcium alginate to left lateral leg and cover with dry dressing 3 times weekly  Xeroform or adaptic to bilateral feet wounds and cover with dry dressing 3 times weekly. -Please arrange for home health upon d/c for wound care assistance. [facility] had made referral to [facility] prior to admission) -Pt to resume care at either [facility] or [facility]  Glenna Fellows, MD [facility] [phone]

## 2013-09-15 NOTE — Plan of Care (Signed)
Problem: Phase I Progression Outcomes Goal: Voiding-avoid urinary catheter unless indicated Outcome: Not Applicable Date Met:  09/15/13 Patient is paraplegic.  Normally does I&O catherizations at home.  Per MD, keep Foley Cath in for now.

## 2013-09-16 DIAGNOSIS — T8740 Infection of amputation stump, unspecified extremity: Secondary | ICD-10-CM

## 2013-09-16 DIAGNOSIS — L89899 Pressure ulcer of other site, unspecified stage: Secondary | ICD-10-CM

## 2013-09-16 DIAGNOSIS — D473 Essential (hemorrhagic) thrombocythemia: Secondary | ICD-10-CM

## 2013-09-16 LAB — BASIC METABOLIC PANEL
BUN: 27 mg/dL — ABNORMAL HIGH (ref 6–23)
CO2: 21 mEq/L (ref 19–32)
Calcium: 7.6 mg/dL — ABNORMAL LOW (ref 8.4–10.5)
Chloride: 108 mEq/L (ref 96–112)
Creatinine, Ser: 3.44 mg/dL — ABNORMAL HIGH (ref 0.50–1.35)
GFR calc Af Amer: 27 mL/min — ABNORMAL LOW (ref >90)
GFR calc non Af Amer: 23 mL/min — ABNORMAL LOW (ref >90)
Glucose, Bld: 100 mg/dL — ABNORMAL HIGH (ref 70–99)
Potassium: 3.8 mEq/L (ref 3.5–5.1)
Sodium: 141 mEq/L (ref 135–145)

## 2013-09-16 LAB — CBC
HCT: 29.1 % — ABNORMAL LOW (ref 39.0–52.0)
Hemoglobin: 9.8 g/dL — ABNORMAL LOW (ref 13.0–17.0)
MCH: 26.4 pg (ref 26.0–34.0)
MCHC: 33.7 g/dL (ref 30.0–36.0)
MCV: 78.4 fL (ref 78.0–100.0)
Platelets: 621 10*3/uL — ABNORMAL HIGH (ref 150–400)
RBC: 3.71 MIL/uL — ABNORMAL LOW (ref 4.22–5.81)
RDW: 15.1 % (ref 11.5–15.5)
WBC: 12.7 10*3/uL — ABNORMAL HIGH (ref 4.0–10.5)

## 2013-09-16 NOTE — Progress Notes (Signed)
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  - Appreciate plastic surgery and ID following, status post debridement in the OR 10/24, wound VAC on, next change on 09/17/2013 per plastic surgery do not plan VAC upon discharge. - On teflaro and flagyl per ID for 6 weeks.  Will place PICC line once creatinine function has normalized or try IR central IJ PICC, if Cr doesn't normalize in 24-48 hours.    Active Problems: Acute renal failure:  possibly worsened due to antibiotics, infection/osteoarthritis, dehydration, HCTZ/ARB, improving - Creatinine 3.4 today - continue IV fluids @75cc /hr for another 24hrs, patient is making urine, renal ultrasound negative for any hydronephrosis  Acinetobacter UTI: Likely colonization, and per ID, no need to treat  Anemia: Possibly due to anemia of chronic disease, hemodilution due to IV fluids - Transfused 2 units packed RBC on 10/23, hemoglobin stable    Spinal cord injury of T10 vertebra    History of DVT of lower extremity -  has a history of IVC filter    Leukocytosis and thrombocytosis-improving  DVT Prophylaxis:  Code Status:  Disposition: Not medically ready    Subjective: No complaints, wounds wrapped on the left foot, wound vac on  Objective: Weight change:   Intake/Output Summary (Last 24 hours) at 09/16/13 0921 Last data filed at 09/16/13 0700  Gross per 24 hour  Intake   2650 ml  Output   4200 ml  Net  -1550 ml   Blood pressure  127/78, pulse 70, temperature 98.5 F (36.9 C), temperature source Oral, resp. rate 15, height 5\' 10"  (1.778 m), weight 120.7 kg (266 lb 1.5 oz), SpO2 93.00%.  Physical Exam: General: Ax O x3 CVS: S1-S2 clear Chest: CTAB  Abdomen: soft NT, ND, NBS  Extremities: left foot wounds dressing intact, wound vac+ Neuro: Paraplegic  Lab Results: Basic Metabolic Panel:  Recent Labs Lab 09/15/13 0602 09/16/13 0700  NA 141 141  K 4.5 3.8  CL 107 108  CO2 22 21  GLUCOSE 97 100*  BUN 32* 27*  CREATININE 3.93* 3.44*  CALCIUM 7.6* 7.6*   Liver Function Tests:  Recent Labs Lab 09/12/13 0600  AST 16  ALT 12  ALKPHOS 94  BILITOT 0.2*  PROT 7.9  ALBUMIN 2.7*   No results found for this basename: LIPASE, AMYLASE,  in the last 168 hours No results found for this basename: AMMONIA,  in the last 168 hours CBC:  Recent Labs Lab 09/11/13 1628  09/15/13 0602 09/16/13 0700  WBC 19.0*  < > 13.4* 12.7*  NEUTROABS 14.2*  --   --   --   HGB 10.0*  < > 9.2* 9.8*  HCT 29.4*  < > 27.8* 29.1*  MCV 75.4*  < > 78.3 78.4  PLT 792*  < > 646* 621*  < > = values in this interval not displayed. Cardiac Enzymes: No results found for this basename: CKTOTAL, CKMB, CKMBINDEX, TROPONINI,  in the last 168 hours BNP: No components found with this basename: POCBNP,  CBG: No results found for this basename: GLUCAP,  in the last 168 hours   Micro Results: Recent Results (from the past 240 hour(s))  URINE CULTURE     Status: None   Collection Time    09/11/13  7:40 PM      Result Value Range Status   Specimen Description URINE, CATHETERIZED   Final   Special Requests NONE   Final   Culture  Setup Time     Final   Value: 09/11/2013 22:03     Performed at [facility] Count     Final   Value: >=100,000 COLONIES/ML     Performed at [facility]   Culture     Final   Value: ACINETOBACTER CALCOACETICUS/BAUMANNII COMPLEX     Performed at [facility]   Report  Status 09/13/2013 FINAL   Final   Organism ID, Bacteria ACINETOBACTER CALCOACETICUS/BAUMANNII COMPLEX   Final  MRSA PCR SCREENING     Status: Abnormal   Collection Time    09/11/13 11:46 PM      Result Value Range Status   MRSA by PCR POSITIVE (*) NEGATIVE Final   Comment:            The GeneXpert MRSA Assay (FDA     approved for NASAL specimens     only), is one component of a     comprehensive MRSA colonization     surveillance program. It is not     intended to diagnose MRSA     infection nor to guide or     monitor treatment for     MRSA infections.     RESULT CALLED TO, READ BACK BY AND VERIFIED WITH:     CALLED TO RN Elsie Lincoln [phone] @0353  THANEY  SURGICAL PCR SCREEN     Status: Abnormal   Collection Time    09/13/13  8:33 PM      Result Value Range Status   MRSA, PCR POSITIVE (*) NEGATIVE Final   Comment: RESULT CALLED TO, READ BACK BY AND VERIFIED WITH:     Sandi Raveling [id number] WILDERK   Staphylococcus aureus POSITIVE (*) NEGATIVE Final   Comment:            The Xpert SA Assay (FDA     approved for NASAL specimens     in patients over 71 years of age),     is one component of     a comprehensive surveillance     program.  Test performance has     been validated by [employer] for patients greater     than or equal to 57 year old.     It is not intended     to diagnose infection nor to     guide or monitor treatment.    Studies/Results: No results found.  Medications: Scheduled Meds: . ceFTAROline (TEFLARO) IV  300 mg Intravenous Q12H  . citalopram  40 mg Oral QHS  . diazepam  10 mg Oral QHS  . heparin  5,000 Units Subcutaneous Q8H  . imipramine  50 mg Oral QHS  . metroNIDAZOLE  500 mg Oral Q6H  . mupirocin ointment  1 application Nasal BID  . nystatin  5 mL Oral QID  . oxybutynin  30 mg Oral QHS      LOS: 5 days   Latarra Eagleton M.D. [facility] 09/16/2013, 9:21 AM Pager: [phone]  If 7PM-7AM, please contact  night-coverage [email or web address] Password [email or web address]

## 2013-09-16 NOTE — Progress Notes (Signed)
    [facility]    Date of Admission:  09/11/2013   Total days of antibiotics 6        Day 3 ceftaroline        Day 3 metronidazole           ID: [name] is a 27 y.o. male with paraplegia and chronic lower extremity decub ulcers c/b cellulitis/osteomyelitis POD#2 amputation of 2nd left toe and debridement of lateral wound on ceftaroline and metronidazole  Active Problems:   Paraplegia following spinal cord injury   History of splenectomy   Spinal cord injury of T10 vertebra   History of DVT of lower extremity   Leukocytosis   Infected decubitus ulcer   Lower extremity cellulitis   Acute renal failure   Anemia   Thrombocytosis    Subjective: afebrile  Medications:  . ceFTAROline (TEFLARO) IV  300 mg Intravenous Q12H  . citalopram  40 mg Oral QHS  . diazepam  10 mg Oral QHS  . heparin  5,000 Units Subcutaneous Q8H  . imipramine  50 mg Oral QHS  . metroNIDAZOLE  500 mg Oral Q6H  . mupirocin ointment  1 application Nasal BID  . nystatin  5 mL Oral QID  . oxybutynin  30 mg Oral QHS    Objective: Vital signs in last 24 hours: Temp:  [98.5 F (36.9 C)-98.9 F (37.2 C)] 98.6 F (37 C) (10/26 1005) Pulse Rate:  [70-87] 70 (10/26 1005) Resp:  [15-19] 18 (10/26 1005) BP: (102-127)/(50-78) 113/67 mmHg (10/26 1005) SpO2:  [92 %-96 %] 96 % (10/26 1005) Weight:  [266 lb 1.5 oz (120.7 kg)] 266 lb 1.5 oz (120.7 kg) (10/25 2129) General: Awake, alert  Skin: vac placed  Lungs: cta  Cor: RRR  Abdomen: soft, nt, nd  Ext: no edema, foot wrapped, serous drainage from left foot bandage.     Lab Results  Recent Labs  09/15/13 0602 09/16/13 0700  WBC 13.4* 12.7*  HGB 9.2* 9.8*  HCT 27.8* 29.1*  NA 141 141  K 4.5 3.8  CL 107 108  CO2 22 21  BUN 32* 27*  CREATININE 3.93* 3.44*   Liver Panel No results found for this basename: PROT, ALBUMIN, AST, ALT, ALKPHOS, BILITOT, BILIDIR, IBILI,  in the last 72 hours Sedimentation Rate  Recent  Labs  09/15/13 0602  ESRSEDRATE 107*   C-Reactive Protein  Recent Labs  09/15/13 0602  CRP 5.9*    Microbiology: 10/21 ur cx: AB  Studies/Results: No results found.   Assessment/Plan: Osteomyelitis/deep tissue infection s/p debridement and amputation of phalange = continue on 6 wks of ceftaroline and oral metronidazole 500mg  TID. Defer to plastics for arrangement of wound care follow up and wound vac mgmt  AKI = patient is having steady improvement in his AKI. Continue with IV fluid hydration  Acinetobacter ur cx = colonized, asymptomatic-> no need to treat  We will have him follow up in the ID clinic in 2-4 wk  Thaxton Pelley, [facility] Cell: [phone] Pager: [phone]  09/16/2013, 12:23 PM

## 2013-09-17 ENCOUNTER — Encounter (HOSPITAL_COMMUNITY): Payer: Self-pay | Admitting: Plastic Surgery

## 2013-09-17 LAB — CBC
HCT: 32.1 % — ABNORMAL LOW (ref 39.0–52.0)
Hemoglobin: 10.7 g/dL — ABNORMAL LOW (ref 13.0–17.0)
MCH: 26.3 pg (ref 26.0–34.0)
MCHC: 33.3 g/dL (ref 30.0–36.0)
MCV: 78.9 fL (ref 78.0–100.0)
Platelets: 584 10*3/uL — ABNORMAL HIGH (ref 150–400)
RBC: 4.07 MIL/uL — ABNORMAL LOW (ref 4.22–5.81)
RDW: 15.4 % (ref 11.5–15.5)
WBC: 12.2 10*3/uL — ABNORMAL HIGH (ref 4.0–10.5)

## 2013-09-17 LAB — BASIC METABOLIC PANEL
BUN: 21 mg/dL (ref 6–23)
CO2: 19 mEq/L (ref 19–32)
Calcium: 7.9 mg/dL — ABNORMAL LOW (ref 8.4–10.5)
Chloride: 107 mEq/L (ref 96–112)
Creatinine, Ser: 2.78 mg/dL — ABNORMAL HIGH (ref 0.50–1.35)
GFR calc Af Amer: 35 mL/min — ABNORMAL LOW (ref >90)
GFR calc non Af Amer: 30 mL/min — ABNORMAL LOW (ref >90)
Glucose, Bld: 74 mg/dL (ref 70–99)
Potassium: 4.4 mEq/L (ref 3.5–5.1)
Sodium: 140 mEq/L (ref 135–145)

## 2013-09-17 NOTE — Progress Notes (Signed)
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  - Appreciate plastic surgery and ID following, status post debridement in the OR 10/24, wound VAC on, change today,  no VAC upon discharge. - On teflaro and flagyl per ID for 6 weeks.    Active Problems: Acute renal failure:  possibly worsened due to antibiotics, infection/osteoarthritis, dehydration, HCTZ/ARB, improving -Will place PICC line once Cr normalized or try IR central IJ PICC, creatinine today 2.78 (<- 6.97 on admission) - renal ultrasound negative for any hydronephrosis  Acinetobacter UTI: Likely colonization, and per ID, no need to treat  Anemia: Possibly due to anemia of chronic disease, hemodilution due to IV fluids - Transfused 2 units packed RBC on 10/23, hemoglobin stable    Spinal cord injury of T10 vertebra    History of DVT of lower extremity -  has a history of IVC filter    Leukocytosis and thrombocytosis-improving  DVT Prophylaxis:  Code Status:  Disposition: Hopefully in 1-2 days D/w patient in detail, he prefers to go home when he is ready. He's okay with home health nurse and wound care   Subjective:  wound vac on, no complaints  Objective: Weight change: -2.13 kg (-4 lb 11.1 oz)  Intake/Output Summary (Last 24 hours) at 09/17/13 1245 Last data filed at 09/17/13 0933  Gross per 24 hour  Intake 2770.42 ml  Output   5000 ml  Net -2229.58 ml   Blood pressure 126/61, pulse 53, temperature 97.9 F (36.6 C),  temperature source Oral, resp. rate 18, height 5\' 10"  (1.778 m), weight 118.57 kg (261 lb 6.4 oz), SpO2 100.00%.  Physical Exam: General: Ax O x3 CVS: S1-S2 clear Chest: CTAB  Abdomen: soft NT, ND, NBS  Extremities: left foot wounds dressing intact, wound vac+ Neuro: Paraplegic  Lab Results: Basic Metabolic Panel:  Recent Labs Lab 09/16/13 0700 09/17/13 0600  NA 141 140  K 3.8 4.4  CL 108 107  CO2 21 19  GLUCOSE 100* 74  BUN 27* 21  CREATININE 3.44* 2.78*  CALCIUM 7.6* 7.9*   Liver Function Tests:  Recent Labs Lab 09/12/13 0600  AST 16  ALT 12  ALKPHOS 94  BILITOT 0.2*  PROT 7.9  ALBUMIN 2.7*   No results found for this basename: LIPASE, AMYLASE,  in the last 168 hours No results found for this basename: AMMONIA,  in the last 168 hours CBC:  Recent Labs Lab 09/11/13 1628  09/16/13 0700 09/17/13 0600  WBC 19.0*  < > 12.7* 12.2*  NEUTROABS 14.2*  --   --   --   HGB 10.0*  < > 9.8* 10.7*  HCT 29.4*  < > 29.1* 32.1*  MCV 75.4*  < > 78.4 78.9  PLT 792*  < > 621* 584*  < > = values in this interval not displayed. Cardiac Enzymes: No results found for this basename: CKTOTAL, CKMB, CKMBINDEX, TROPONINI,  in the last 168 hours BNP: No components found with this basename: POCBNP,  CBG: No results found for  this basename: GLUCAP,  in the last 168 hours   Micro Results: Recent Results (from the past 240 hour(s))  URINE CULTURE     Status: None   Collection Time    09/11/13  7:40 PM      Result Value Range Status   Specimen Description URINE, CATHETERIZED   Final   Special Requests NONE   Final   Culture  Setup Time     Final   Value: 09/11/2013 22:03     Performed at [facility] Count     Final   Value: >=100,000 COLONIES/ML     Performed at [facility]   Culture     Final   Value: ACINETOBACTER CALCOACETICUS/BAUMANNII COMPLEX     Performed at [facility]   Report Status 09/13/2013 FINAL   Final   Organism ID,  Bacteria ACINETOBACTER CALCOACETICUS/BAUMANNII COMPLEX   Final  MRSA PCR SCREENING     Status: Abnormal   Collection Time    09/11/13 11:46 PM      Result Value Range Status   MRSA by PCR POSITIVE (*) NEGATIVE Final   Comment:            The GeneXpert MRSA Assay (FDA     approved for NASAL specimens     only), is one component of a     comprehensive MRSA colonization     surveillance program. It is not     intended to diagnose MRSA     infection nor to guide or     monitor treatment for     MRSA infections.     RESULT CALLED TO, READ BACK BY AND VERIFIED WITH:     CALLED TO RN Elsie Lincoln [phone] @0353  THANEY  SURGICAL PCR SCREEN     Status: Abnormal   Collection Time    09/13/13  8:33 PM      Result Value Range Status   MRSA, PCR POSITIVE (*) NEGATIVE Final   Comment: RESULT CALLED TO, READ BACK BY AND VERIFIED WITH:     Sandi Raveling [phone] WILDERK   Staphylococcus aureus POSITIVE (*) NEGATIVE Final   Comment:            The Xpert SA Assay (FDA     approved for NASAL specimens     in patients over 78 years of age),     is one component of     a comprehensive surveillance     program.  Test performance has     been validated by [employer] for patients greater     than or equal to 32 year old.     It is not intended     to diagnose infection nor to     guide or monitor treatment.    Studies/Results: No results found.  Medications: Scheduled Meds: . ceFTAROline (TEFLARO) IV  300 mg Intravenous Q12H  . citalopram  40 mg Oral QHS  . diazepam  10 mg Oral QHS  . heparin  5,000 Units Subcutaneous Q8H  . imipramine  50 mg Oral QHS  . metroNIDAZOLE  500 mg Oral Q6H  . nystatin  5 mL Oral QID  . oxybutynin  30 mg Oral QHS      LOS: 6 days   RAI,RIPUDEEP M.D. [facility] 09/17/2013, 12:45 PM Pager: [phone]  If 7PM-7AM, please contact night-coverage [email or web address] Password [email or web address]

## 2013-09-17 NOTE — Progress Notes (Signed)
3 Days Post-Op  Subjective: Patient reports he is feeling better.  Left foot 2nd toe amputation site is without signs of infection. The toes were re-dressed with xeroform bilaterally. The left lateral calf VAC dressing was changed. The distal portion of the wound had some slough present and this was debrided with gauze. The rest of the wound is pink and healthy appearing and bled with dressing change. The VAC dressing was re-applied and he tolerated this well.   Objective: Vital signs in last 24 hours: Temp:  [97.9 F (36.6 C)-98.2 F (36.8 C)] 97.9 F (36.6 C) (10/27 0932) Pulse Rate:  [53-71] 53 (10/27 0932) Resp:  [16-18] 18 (10/27 0932) BP: (126-130)/(61-82) 126/61 mmHg (10/27 0932) SpO2:  [96 %-100 %] 100 % (10/27 0932) Weight:  [118.57 kg (261 lb 6.4 oz)] 118.57 kg (261 lb 6.4 oz) (10/26 2100) Last BM Date: 09/14/13  Intake/Output from previous day: 10/26 0701 - 10/27 0700 In: 2810.4 [P.O.:1440; I.V.:1120.4; IV Piggyback:250] Out: 5100 [Urine:5100] Intake/Output this shift: Total I/O In: 200 [P.O.:200] Out: 650 [Urine:650]  General appearance: alert, cooperative and no distress The VAC dressing to the left lateral calf and xeroform to the toes were re-applied as noted above  Lab Results:   Recent Labs  09/16/13 0700 09/17/13 0600  WBC 12.7* 12.2*  HGB 9.8* 10.7*  HCT 29.1* 32.1*  PLT 621* 584*   BMET  Recent Labs  09/16/13 0700 09/17/13 0600  NA 141 140  K 3.8 4.4  CL 108 107  CO2 21 19  GLUCOSE 100* 74  BUN 27* 21  CREATININE 3.44* 2.78*  CALCIUM 7.6* 7.9*   PT/INR No results found for this basename: LABPROT, INR,  in the last 72 hours ABG No results found for this basename: PHART, PCO2, PO2, HCO3,  in the last 72 hours  Studies/Results: No results found.  Anti-infectives: Anti-infectives   Start     Dose/Rate Route Frequency Ordered Stop   09/14/13 1500  ceftaroline (TEFLARO) 300 mg in sodium chloride 0.9 % 250 mL IVPB    Comments:  Pharmacy  may adjust. OK with allergy   300 mg 250 mL/hr over 60 Minutes Intravenous Every 12 hours 09/14/13 1345     09/14/13 1500  metroNIDAZOLE (FLAGYL) tablet 500 mg     500 mg Oral 4 times per day 09/14/13 1345     09/13/13 2000  vancomycin (VANCOCIN) 1,500 mg in sodium chloride 0.9 % 500 mL IVPB  Status:  Discontinued     1,500 mg 250 mL/hr over 120 Minutes Intravenous  Once 09/13/13 0957 09/13/13 1328   09/11/13 1745  vancomycin (VANCOCIN) 2,500 mg in sodium chloride 0.9 % 500 mL IVPB     2,500 mg 250 mL/hr over 120 Minutes Intravenous  Once 09/11/13 1741 09/11/13 1931      Assessment/Plan: s/p Procedure(s): DEBRIDEMENT OF BILATERAL LOWER LEGS EXTRIMITIES (Bilateral) AMPUTATION OF LEFT SECOND TOE WITH PLACEMENT OF VAC (Left) POD #3- Doing well with regards to wounds Per Dr. Maude Leriche rec's:  Rec  -Low air loss mattress for home.  -Can continue VAC while in hospital.  Do not plan VAC upon d/c.  -Will change to wound care dressings upon d/c:  rec calcium alginate to left lateral leg and cover with dry dressing 3 times weekly  Xeroform or adaptic to bilateral feet wounds and cover with dry dressing 3 times weekly.  -Please arrange for home health upon d/c for wound care assistance. [facility] had made referral to [facility] prior to  admission)  -Pt to resume care at either [facility] or [facility]   LOS: 6 days    Franki Monte 09/17/2013 Plastic Surgery [phone]  Plan as noted above. Pending patient's discharge, next VAC change 09/20/13, will likely leave off at that time and pt can resume local wound care. Needs home health arranged for d/c. Glenna Fellows, MD [facility] [phone]

## 2013-09-18 LAB — BASIC METABOLIC PANEL
BUN: 16 mg/dL (ref 6–23)
CO2: 25 mEq/L (ref 19–32)
Calcium: 7.9 mg/dL — ABNORMAL LOW (ref 8.4–10.5)
Chloride: 103 mEq/L (ref 96–112)
Creatinine, Ser: 2.35 mg/dL — ABNORMAL HIGH (ref 0.50–1.35)
GFR calc Af Amer: 43 mL/min — ABNORMAL LOW (ref >90)
GFR calc non Af Amer: 37 mL/min — ABNORMAL LOW (ref >90)
Glucose, Bld: 79 mg/dL (ref 70–99)
Potassium: 4.1 mEq/L (ref 3.5–5.1)
Sodium: 139 mEq/L (ref 135–145)

## 2013-09-18 LAB — CBC
HCT: 31.9 % — ABNORMAL LOW (ref 39.0–52.0)
Hemoglobin: 10.6 g/dL — ABNORMAL LOW (ref 13.0–17.0)
MCH: 26.2 pg (ref 26.0–34.0)
MCHC: 33.2 g/dL (ref 30.0–36.0)
MCV: 78.8 fL (ref 78.0–100.0)
Platelets: 611 10*3/uL — ABNORMAL HIGH (ref 150–400)
RBC: 4.05 MIL/uL — ABNORMAL LOW (ref 4.22–5.81)
RDW: 15.5 % (ref 11.5–15.5)
WBC: 12 10*3/uL — ABNORMAL HIGH (ref 4.0–10.5)

## 2013-09-18 MED ORDER — POLYETHYLENE GLYCOL 3350 17 G PO PACK
17.0000 g | PACK | Freq: Once | ORAL | Status: AC
  Administered 2013-09-18: 17 g via ORAL
  Filled 2013-09-18: qty 1

## 2013-09-18 MED ORDER — FLEET ENEMA 7-19 GM/118ML RE ENEM
1.0000 | ENEMA | Freq: Every day | RECTAL | Status: DC | PRN
  Filled 2013-09-18: qty 1

## 2013-09-18 MED ORDER — DOCUSATE SODIUM 100 MG PO CAPS
200.0000 mg | ORAL_CAPSULE | Freq: Two times a day (BID) | ORAL | Status: DC
  Administered 2013-09-18: 200 mg via ORAL
  Filled 2013-09-18 (×6): qty 2

## 2013-09-18 NOTE — Progress Notes (Signed)
Patient ID: [name]  male  GNF:[id number]    DOB: 1986-04-17    DOA: 09/11/2013  PCP: Default, Provider, MD  Interval history [name] is a 27 y.o. male with history of T10 paraplegia, self catheterizes when necessary, chronic left lower extremity decubitus ulcer status post skin graft complicated by cellulitis, completed 30-34 days of IV vancomycin, was on oral antibiotics prior to admission, OSA, history of lower extremity DVT, presented to the ED on 09/11/13 with complaints of bilateral legs, left >right swelling, redness, drainage from wounds, generally feeling unwell, poor appetite, lack of energy, occasional vomiting. He had seen ID M.D. on 09/05/13 and was transitioned to oral antibiotics, was supposed to follow up with wound care at [facility] and return to followup with Dr. Drue Second.  In the ED, the patient had infected bilateral leg wounds with cellulitis with sloughing wounds on the left foot and lab work indicating acute renal failure with creatinine of 6.97, WBC 19,000 and platelets 792.  Patient's fianc of 2 years states that this is the worst that she has seen his left leg. Patient states that he has not taken HCTZ to 3 days. He also states that he may have taken losartan up to 2-3 days-however was not part of his med rec currently. Patient was admitted and placed on IV antibiotics, infectious disease service was consulted. Patient had seen plastic surgery, Dr. Kelly Splinter in the past hence plastic surgery was also consulted. Also found to have osteomyelitis of the left second toe on the x-ray. Patient underwent debridement in the OR on 09/14/13, wound VAC was placed, changed on 10/27, will be off on 09/20/13. He was recommended 6 weeks of IV teflaro and Flagyl by ID. His renal function has been steadily improving, likely worsened due to infection/osteomyelitis, antibiotics, HCTZ/ARB. Cr was 6.97 on admission, improved to 2.35 today. He has been off IV fluids as of 09/17/13.       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  - Appreciate plastic surgery and ID following, status post debridement in the OR 10/24, and amputation of left 2nd toe, wound VAC on, changed 10/27, to be off on 09/20/13,  no VAC upon discharge. - On teflaro and flagyl per ID for 6 weeks.    Active Problems: Acute renal failure:  possibly worsened due to antibiotics, infection/osteomyelitis, dehydration, HCTZ/ARB, improving - creatinine today 2.35 (<- 6.97 on admission), I confirmed with the IV services, they should be with able to put the PICC line tomorrow, requested creatinine clearance to be above 30. - renal ultrasound negative for any hydronephrosis  Acinetobacter UTI: Likely colonization, and per ID, no need to treat  Anemia: Possibly due to anemia of chronic disease, hemodilution due to IV fluids - Transfused 2 units packed RBC on 10/23, hemoglobin stable    Spinal cord injury of T10 vertebra    History of DVT of lower extremity -  has a history of IVC filter    Leukocytosis and thrombocytosis-improving  DVT Prophylaxis:  Code Status:  Disposition: Hopefully in 1-2 days D/w patient in detail, he prefers to go home when he is ready. Home  health RN, PT, OT arranged, DME overlay mattress at home noted. Patient should be ready for DC once wound VAC is off, PICC line will be placed tomorrow as long as creatinine function stays stable and improving.  Subjective:  wound vac on, no complaints  Objective: Weight change: 0.53 kg (1 lb 2.7 oz)  Intake/Output Summary (Last 24 hours) at 09/18/13  1408 Last data filed at 09/18/13 0645  Gross per 24 hour  Intake 1185.75 ml  Output   2925 ml  Net -1739.25 ml   Blood pressure 132/58, pulse 55, temperature 98 F (36.7 C), temperature source Oral, resp. rate 18, height 5\' 10"  (1.778 m), weight 119.1 kg (262 lb 9.1 oz), SpO2 99.00%.  Physical Exam: General: Ax O x3 CVS: S1-S2 clear Chest: CTAB  Abdomen: soft NT, ND, NBS  Extremities: left foot wounds dressing intact, wound vac+ Neuro: Paraplegic  Lab Results: Basic Metabolic Panel:  Recent Labs Lab 09/17/13 0600 09/18/13 0351  NA 140 139  K 4.4 4.1  CL 107 103  CO2 19 25  GLUCOSE 74 79  BUN 21 16  CREATININE 2.78* 2.35*  CALCIUM 7.9* 7.9*   Liver Function Tests:  Recent Labs Lab 09/12/13 0600  AST 16  ALT 12  ALKPHOS 94  BILITOT 0.2*  PROT 7.9  ALBUMIN 2.7*   No results found for this basename: LIPASE, AMYLASE,  in the last 168 hours No results found for this basename: AMMONIA,  in the last 168 hours CBC:  Recent Labs Lab 09/11/13 1628  09/17/13 0600 09/18/13 0351  WBC 19.0*  < > 12.2* 12.0*  NEUTROABS 14.2*  --   --   --   HGB 10.0*  < > 10.7* 10.6*  HCT 29.4*  < > 32.1* 31.9*  MCV 75.4*  < > 78.9 78.8  PLT 792*  < > 584* 611*  < > = values in this interval not displayed. Cardiac Enzymes: No results found for this basename: CKTOTAL, CKMB, CKMBINDEX, TROPONINI,  in the last 168 hours BNP: No components found with this basename: POCBNP,  CBG: No results found for this basename: GLUCAP,  in the last 168 hours   Micro Results: Recent Results (from the past 240 hour(s))  URINE CULTURE     Status: None   Collection Time    09/11/13  7:40 PM      Result Value Range Status   Specimen Description URINE, CATHETERIZED   Final   Special Requests NONE   Final   Culture  Setup Time     Final   Value: 09/11/2013 22:03     Performed at [facility] Count     Final   Value: >=100,000 COLONIES/ML     Performed at [facility]    Culture     Final   Value: ACINETOBACTER CALCOACETICUS/BAUMANNII COMPLEX     Performed at [facility]   Report Status 09/13/2013 FINAL   Final   Organism ID, Bacteria ACINETOBACTER CALCOACETICUS/BAUMANNII COMPLEX   Final  MRSA PCR SCREENING     Status: Abnormal   Collection Time    09/11/13 11:46 PM      Result Value Range Status   MRSA by PCR POSITIVE (*) NEGATIVE Final   Comment:            The GeneXpert MRSA Assay (FDA     approved for NASAL specimens     only), is one component of a     comprehensive MRSA  colonization     surveillance program. It is not     intended to diagnose MRSA     infection nor to guide or     monitor treatment for     MRSA infections.     RESULT CALLED TO, READ BACK BY AND VERIFIED WITH:     CALLED TO RN Elsie Lincoln [phone] @0353  THANEY  SURGICAL PCR SCREEN     Status: Abnormal   Collection Time    09/13/13  8:33 PM      Result Value Range Status   MRSA, PCR POSITIVE (*) NEGATIVE Final   Comment: RESULT CALLED TO, READ BACK BY AND VERIFIED WITH:     N MURPHY,RN [phone] WILDERK   Staphylococcus aureus POSITIVE (*) NEGATIVE Final   Comment:            The Xpert SA Assay (FDA     approved for NASAL specimens     in patients over 62 years of age),     is one component of     a comprehensive surveillance     program.  Test performance has     been validated by [employer] for patients greater     than or equal to 42 year old.     It is not intended     to diagnose infection nor to     guide or monitor treatment.    Studies/Results: No results found.  Medications: Scheduled Meds: . ceFTAROline (TEFLARO) IV  300 mg Intravenous Q12H  . citalopram  40 mg Oral QHS  . diazepam  10 mg Oral QHS  . docusate sodium  200 mg Oral BID  . heparin  5,000 Units Subcutaneous Q8H  . imipramine  50 mg Oral QHS  . metroNIDAZOLE  500 mg Oral Q6H  . nystatin  5 mL Oral QID  . oxybutynin  30 mg Oral QHS      LOS: 7 days    RAI,RIPUDEEP M.D. [facility] 09/18/2013, 2:08 PM Pager: [phone]  If 7PM-7AM, please contact night-coverage [email or web address] Password [email or web address]

## 2013-09-18 NOTE — Care Management Note (Signed)
   CARE MANAGEMENT NOTE 09/18/2013  Patient:  [name],[name]   Account Number:  [id number]  Date Initiated:  09/14/2013  Documentation initiated by:  Darlyne Russian  Subjective/Objective Assessment:   Patient admitted with bilateral lower extremity wounds, + osteo left toe. Prior medical history: paraplegia, f/u with wound clinic at [facility].     Action/Plan:   Progression of care and discharge planning   Anticipated DC Date:  09/20/2013   Anticipated DC Plan:  HOME W HOME HEALTH SERVICES         Choice offered to / List presented to:     DME arranged  IV PUMP/EQUIPMENT      DME agency  [facility]     [facility] arranged  HH-1 RN  HH-2 PT  HH-3 OT      [facility] agency  [facility]   Status of service:  In process, will continue to follow Medicare Important Message given?   (If response is "NO", the following Medicare IM given date fields will be blank) Date Medicare IM given:   Date Additional Medicare IM given:    Discharge Disposition:  HOME W HOME HEALTH SERVICES  Per UR Regulation:    If discussed at Long Length of Stay Meetings, dates discussed:    Comments:   09/18/2014 Met with pt , who selected [facility] for HHRN, PT, OT and IV antibiotics. [facility] notified and will follow for d/c orders and antibiotic needs. Pt has used [facility] previously and girlfriend will assist with home health needs. Johny Shock RN MPH, case manager [phone]   09/14/2013  [address] [place], [place]  [phone]  Surgery: 09/14/2013  debridement right foot, Excisional debridement of left leg, Left 2nd toe amputation  AKI: Cr 4.8

## 2013-09-19 ENCOUNTER — Ambulatory Visit: Payer: PRIVATE HEALTH INSURANCE | Admitting: Internal Medicine

## 2013-09-19 ENCOUNTER — Inpatient Hospital Stay (HOSPITAL_COMMUNITY): Payer: PRIVATE HEALTH INSURANCE

## 2013-09-19 LAB — BASIC METABOLIC PANEL
BUN: 15 mg/dL (ref 6–23)
CO2: 26 mEq/L (ref 19–32)
Calcium: 7.7 mg/dL — ABNORMAL LOW (ref 8.4–10.5)
Chloride: 105 mEq/L (ref 96–112)
Creatinine, Ser: 1.98 mg/dL — ABNORMAL HIGH (ref 0.50–1.35)
GFR calc Af Amer: 53 mL/min — ABNORMAL LOW (ref >90)
GFR calc non Af Amer: 46 mL/min — ABNORMAL LOW (ref >90)
Glucose, Bld: 95 mg/dL (ref 70–99)
Potassium: 3.7 mEq/L (ref 3.5–5.1)
Sodium: 139 mEq/L (ref 135–145)

## 2013-09-19 NOTE — Progress Notes (Signed)
POD# 5 debridement BLE and  Left 2nd toe amputation  Notes indicate pt home within next day. HH arranged for wound care and IV abx. Pt has scheduled appt for [facility].  PE Removed VAC, left lateral leg wound with slough, not further areas of skin necrosis, granulated Removed slough with guaze and dressed with patient's home absorbent dressing. R foot with pressure ulceration stage 2 in webspace. Cleansed and dressed with adaptic, dry dressing between toes L 2nd toe amputation site with viable skin, though ulcerated from pre surgery pressure, with slough Cleansed and dressed similarly  Wound care recs for d/c made earlier; will continue with [facility] and Neurology appts as scheduled.

## 2013-09-19 NOTE — Progress Notes (Signed)
Patient ID: [name]  male  ZOX:[id number]    DOB: 12-01-1985    DOA: 09/11/2013  PCP: Default, Provider, MD  Interval history [name] is a 27 y.o. male with history of T10 paraplegia, self catheterizes when necessary, chronic left lower extremity decubitus ulcer status post skin graft complicated by cellulitis, completed 30-34 days of IV vancomycin, was on oral antibiotics prior to admission, OSA, history of lower extremity DVT, presented to the ED on 09/11/13 with complaints of bilateral legs, left >right swelling, redness, drainage from wounds, generally feeling unwell, poor appetite, lack of energy, occasional vomiting. He had seen ID M.D. on 09/05/13 and was transitioned to oral antibiotics, was supposed to follow up with wound care at [facility] and return to followup with Dr. Drue Second.  In the ED, the patient had infected bilateral leg wounds with cellulitis with sloughing wounds on the left foot and lab work indicating acute renal failure with creatinine of 6.97, WBC 19,000 and platelets 792.  Patient's fianc of 2 years states that this is the worst that she has seen his left leg. Patient states that he has not taken HCTZ to 3 days. He also states that he may have taken losartan up to 2-3 days-however was not part of his med rec currently. Patient was admitted and placed on IV antibiotics, infectious disease service was consulted. Patient had seen plastic surgery, Dr. Kelly Splinter in the past hence plastic surgery was also consulted. Also found to have osteomyelitis of the left second toe on the x-ray. Patient underwent debridement in the OR on 09/14/13, wound VAC was placed, changed on 10/27, will be off on 09/19/13. He was recommended 6 weeks of IV teflaro and Flagyl by ID. His renal function has been steadily improving, likely worsened due to infection/osteomyelitis, antibiotics, HCTZ/ARB. Cr was 6.97 on admission, improved to 2.35 today. He has been off IV fluids as of 09/17/13.       Assessment/Plan:  Principal problem  Infected decubitus ulcers, LE wounds with history of paraplegia following spinal cord injury, osteomyelitis in the left 2nd toe - Patient had bilateral lower extremity ulcers on both feet and draining large amount from the left leg, stated that his fiance was doing dressings at home, he follows wound care at [facility], although appeared to be noncompliant and not receiving good home wound therapy.  - Appreciate plastic surgery and ID following, status post debridement in the OR 10/24, and amputation of left 2nd toe, wound VAC on, changed 10/27, to be off on 09/19/13,  no VAC upon discharge. - On teflaro and flagyl per ID for 6 weeks.   -scheduled appt for [facility] Low air loss mattress for home.  rec calcium alginate to left lateral leg and cover with dry dressing 3 times weekly  Xeroform or adaptic to bilateral feet wounds and cover with dry dressing 3 times weekly   Acute renal failure:  possibly worsened due to antibiotics, infection/osteomyelitis, dehydration, HCTZ/ARB, improving - creatinine today 1.9 (<- 6.97 on admission), for PICC Line today - renal ultrasound negative for any hydronephrosis  Acinetobacter UTI: Likely colonization, and per ID, no need to treat  Anemia: Possibly due to anemia of chronic disease, hemodilution due to IV fluids - Transfused 2 units packed RBC on 10/23, hemoglobin stable    Spinal cord injury of T10 vertebra    History of DVT of lower extremity -  has a history of IVC filter    Leukocytosis and thrombocytosis-improving   Disposition:  Hopefully in 1-2 days D/w patient in detail, he prefers to go home when he is ready. Home health RN, PT, OT arranged, DME overlay mattress at home noted. Patient should be ready for DC once wound VAC is off, PICC line will be placed tomorrow as long as creatinine function stays stable and improving.  Subjective:  asking for PICC Line to be sewn in  Objective: Weight  change: -0.666 kg (-1 lb 7.5 oz)  Intake/Output Summary (Last 24 hours) at 09/19/13 1109 Last data filed at 09/19/13 0900  Gross per 24 hour  Intake    920 ml  Output   5225 ml  Net  -4305 ml   Blood pressure 113/55, pulse 83, temperature 98.4 F (36.9 C), temperature source Oral, resp. rate 18, height 5' 10.08" (1.78 m), weight 118.434 kg (261 lb 1.6 oz), SpO2 96.00%.  Physical Exam: General: Ax O x3 CVS: S1-S2 clear Chest: CTAB  Abdomen: soft NT, ND, NBS  Extremities: left foot wounds dressing intact Neuro: Paraplegic  Lab Results: Basic Metabolic Panel:  Recent Labs Lab 09/18/13 0351 09/19/13 0549  NA 139 139  K 4.1 3.7  CL 103 105  CO2 25 26  GLUCOSE 79 95  BUN 16 15  CREATININE 2.35* 1.98*  CALCIUM 7.9* 7.7*   Liver Function Tests: No results found for this basename: AST, ALT, ALKPHOS, BILITOT, PROT, ALBUMIN,  in the last 168 hours No results found for this basename: LIPASE, AMYLASE,  in the last 168 hours No results found for this basename: AMMONIA,  in the last 168 hours CBC:  Recent Labs Lab 09/17/13 0600 09/18/13 0351  WBC 12.2* 12.0*  HGB 10.7* 10.6*  HCT 32.1* 31.9*  MCV 78.9 78.8  PLT 584* 611*   Cardiac Enzymes: No results found for this basename: CKTOTAL, CKMB, CKMBINDEX, TROPONINI,  in the last 168 hours BNP: No components found with this basename: POCBNP,  CBG: No results found for this basename: GLUCAP,  in the last 168 hours   Micro Results: Recent Results (from the past 240 hour(s))  URINE CULTURE     Status: None   Collection Time    09/11/13  7:40 PM      Result Value Range Status   Specimen Description URINE, CATHETERIZED   Final   Special Requests NONE   Final   Culture  Setup Time     Final   Value: 09/11/2013 22:03     Performed at [facility] Count     Final   Value: >=100,000 COLONIES/ML     Performed at [facility]   Culture     Final   Value: ACINETOBACTER CALCOACETICUS/BAUMANNII COMPLEX      Performed at [facility]   Report Status 09/13/2013 FINAL   Final   Organism ID, Bacteria ACINETOBACTER CALCOACETICUS/BAUMANNII COMPLEX   Final  MRSA PCR SCREENING     Status: Abnormal   Collection Time    09/11/13 11:46 PM      Result Value Range Status   MRSA by PCR POSITIVE (*) NEGATIVE Final   Comment:            The GeneXpert MRSA Assay (FDA     approved for NASAL specimens     only), is one component of a     comprehensive MRSA colonization     surveillance program. It is not     intended to diagnose MRSA     infection nor to guide or  monitor treatment for     MRSA infections.     RESULT CALLED TO, READ BACK BY AND VERIFIED WITH:     CALLED TO RN Elsie Lincoln [phone] @0353  THANEY  SURGICAL PCR SCREEN     Status: Abnormal   Collection Time    09/13/13  8:33 PM      Result Value Range Status   MRSA, PCR POSITIVE (*) NEGATIVE Final   Comment: RESULT CALLED TO, READ BACK BY AND VERIFIED WITH:     N MURPHY,RN [phone] WILDERK   Staphylococcus aureus POSITIVE (*) NEGATIVE Final   Comment:            The Xpert SA Assay (FDA     approved for NASAL specimens     in patients over 56 years of age),     is one component of     a comprehensive surveillance     program.  Test performance has     been validated by [employer] for patients greater     than or equal to 23 year old.     It is not intended     to diagnose infection nor to     guide or monitor treatment.    Studies/Results: No results found.  Medications: Scheduled Meds: . ceFTAROline (TEFLARO) IV  300 mg Intravenous Q12H  . citalopram  40 mg Oral QHS  . diazepam  10 mg Oral QHS  . docusate sodium  200 mg Oral BID  . heparin  5,000 Units Subcutaneous Q8H  . imipramine  50 mg Oral QHS  . metroNIDAZOLE  500 mg Oral Q6H  . nystatin  5 mL Oral QID  . oxybutynin  30 mg Oral QHS      LOS: 8 days   Srinika Delone DO. [facility] 09/19/2013, 11:09 AM Pager: [phone]  If  7PM-7AM, please contact night-coverage [email or web address] Password [email or web address]

## 2013-09-19 NOTE — Procedures (Signed)
Procedure:  Right arm PICC line Access:  Right basilic vein 42 cm SL Power PICC placed.  Tip at cavoatrial junction.

## 2013-09-20 LAB — BASIC METABOLIC PANEL
BUN: 15 mg/dL (ref 6–23)
CO2: 27 mEq/L (ref 19–32)
Calcium: 7.6 mg/dL — ABNORMAL LOW (ref 8.4–10.5)
Chloride: 106 mEq/L (ref 96–112)
Creatinine, Ser: 1.88 mg/dL — ABNORMAL HIGH (ref 0.50–1.35)
GFR calc Af Amer: 56 mL/min — ABNORMAL LOW (ref >90)
GFR calc non Af Amer: 49 mL/min — ABNORMAL LOW (ref >90)
Glucose, Bld: 87 mg/dL (ref 70–99)
Potassium: 3.7 mEq/L (ref 3.5–5.1)
Sodium: 143 mEq/L (ref 135–145)

## 2013-09-20 MED ORDER — HEPARIN SOD (PORK) LOCK FLUSH 100 UNIT/ML IV SOLN
250.0000 [IU] | INTRAVENOUS | Status: AC | PRN
  Administered 2013-09-20: 250 [IU]

## 2013-09-20 MED ORDER — SODIUM CHLORIDE 0.9 % IJ SOLN: 10.0000 mL | Freq: Two times a day (BID) | INTRAMUSCULAR | Status: DC

## 2013-09-20 MED ORDER — SODIUM CHLORIDE 0.9 % IJ SOLN
10.0000 mL | INTRAMUSCULAR | Status: DC | PRN
  Administered 2013-09-20: 10 mL

## 2013-09-20 MED ORDER — METRONIDAZOLE 500 MG PO TABS: 500.0000 mg | ORAL_TABLET | Freq: Four times a day (QID) | ORAL | Status: DC

## 2013-09-20 MED ORDER — SODIUM CHLORIDE 0.9 % IV SOLN: 300.0000 mg | Freq: Two times a day (BID) | INTRAVENOUS | Status: DC

## 2013-09-20 NOTE — Care Management Note (Signed)
   CARE MANAGEMENT NOTE 09/20/2013  Patient:  [name],[name]   Account Number:  [id number]  Date Initiated:  09/14/2013  Documentation initiated by:  Darlyne Russian  Subjective/Objective Assessment:   Patient admitted with bilateral lower extremity wounds, + osteo left toe. Prior medical history: paraplegia, f/u with wound clinic at [facility].     Action/Plan:   Progression of care and discharge planning  [facility] to provide [facility] for IV antibiotics and SW to assist pt with disability applications.   Anticipated DC Date:  09/20/2013   Anticipated DC Plan:  HOME W HOME HEALTH SERVICES         Choice offered to / List presented to:     DME arranged  IV PUMP/EQUIPMENT      DME agency  [facility]     [facility] arranged  HH-1 RN  HH-2 PT  HH-3 OT  HH-6 SOCIAL WORKER      HH agency  [facility]   Status of service:  Completed, signed off Medicare Important Message given?   (If response is "NO", the following Medicare IM given date fields will be blank) Date Medicare IM given:   Date Additional Medicare IM given:    Discharge Disposition:  HOME W HOME HEALTH SERVICES  Per UR Regulation:    If discussed at Long Length of Stay Meetings, dates discussed:    Comments:  09/18/2014 Met with pt , who selected [facility] for HHRN, PT, OT and IV antibiotics. [facility] notified and will follow for d/c orders and antibiotic needs. Pt has used [facility] previously and girlfriend will assist with home health needs. Johny Shock RN MPH, case manager [phone]   09/14/2013  [address] [place], [place]  [phone]  Surgery: 09/14/2013  debridement right foot, Excisional debridement of left leg, Left 2nd toe amputation  AKI: Cr 4.8

## 2013-09-20 NOTE — Discharge Summary (Addendum)
Physician Discharge Summary  [name] ZOX:[id number] DOB: [birth year] DOA: 09/11/2013  PCP: Default, Provider, MD  Admit date: 09/11/2013 Discharge date: 09/20/2013  Time spent: 35 minutes  Recommendations for Outpatient Follow-up:  Follow up at [facility] wound care Low air loss mattress for home.  rec calcium alginate to left lateral leg and cover with dry dressing 3 times weekly  Xeroform or adaptic to bilateral feet wounds and cover with dry dressing 3 times weekly  IV abx for 6 weeks- PICC line  Discharge Diagnoses:  Active Problems:   Paraplegia following spinal cord injury   History of splenectomy   Spinal cord injury of T10 vertebra   History of DVT of lower extremity   Leukocytosis   Infected decubitus ulcer   Lower extremity cellulitis   Acute renal failure   Anemia   Thrombocytosis   Discharge Condition: improved  Diet recommendation: regular  Filed Weights   09/17/13 2131 09/18/13 2032 09/19/13 2122  Weight: 119.1 kg (262 lb 9.1 oz) 118.434 kg (261 lb 1.6 oz) 114.624 kg (252 lb 11.2 oz)    History of present illness:  [name] is a 27 y.o. male with history of T10 paraplegia, self catheterizes when necessary, chronic left lower extremity decubitus ulcer status post skin graft complicated by cellulitis, completed 30-34 days of IV vancomycin and currently on oral antibiotics, OSA, history of lower extremity DVT, presented to the ED on 09/11/13 with complaints of bilateral legs, left >right swelling, redness, drainage from wounds, generally feeling unwell, poor appetite, lack of energy, occasional vomiting. He has recently seen ID M.D. on 09/05/13 and was transitioned to oral antibiotics, was supposed to follow up with wound care at [facility] and return to followup with Dr. Drue Second. In the ED, suggestive of infected bilateral leg wounds with cellulitis and lab work indicating acute renal failure with creatinine of 6.97, WBC 19,000 and platelets 792. Hospitalist  admission requested. Patient's fianc of 2 years states that this is the worst that she has seen his left leg. Patient states that he has not taken HCTZ to 3 days. He also states that he may have taken losartan up to 2-3 days-however not part of his med rec currently.   Hospital Course:  Infected decubitus ulcers, LE wounds with history of paraplegia following spinal cord injury, osteomyelitis in the left 2nd toe  - Patient had bilateral lower extremity ulcers on both feet and draining large amount from the left leg, stated that his fiance was doing dressings at home, he follows wound care at [facility], although appeared to be noncompliant and not receiving good home wound therapy.  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  - On teflaro and flagyl per ID for 6 weeks.  -scheduled appt for [facility]  Low air loss mattress for home.  rec calcium alginate to left lateral leg and cover with dry dressing 3 times weekly  Xeroform or adaptic to bilateral feet wounds and cover with dry dressing 3 times weekly   Acute renal failure: possibly worsened due to antibiotics, infection/osteomyelitis, dehydration, HCTZ/ARB, improving  -  for PICC Line today  - renal ultrasound negative for any hydronephrosis   Acinetobacter UTI: Likely colonization, and per ID, no need to treat   Anemia: Possibly due to anemia of chronic disease, hemodilution due to IV fluids  -  Transfused 2 units packed RBC on 10/23, hemoglobin stable   Spinal cord injury of T10 vertebra   History of DVT of lower extremity  - has a history of IVC filter   Leukocytosis and thrombocytosis-improving   Procedures:  none  Consultations:  ID  Plastic surgery  Discharge Exam: Filed Vitals:   09/20/13 0500  BP: 118/72  Pulse: 68  Temp: 98.6 F (37 C)  Resp: 18     General: A+OX3, NAD Cardiovascular: rrr Respiratory: clear anterior  Discharge Instructions  Discharge Orders   Future Appointments Provider Department Dept Phone   09/25/2013 2:30 PM Cliffton Asters, MD [facility] for Infectious Disease [phone]   Future Orders Complete By Expires   Diet general  As directed    Discharge instructions  As directed    Comments:     D/c with PICC Home health Low air loss mattress for home.   rec calcium alginate to left lateral leg and cover with dry dressing 3 times weekly   Xeroform or adaptic to bilateral feet wounds and cover with dry dressing 3 times weekly   Increase activity slowly  As directed        Medication List    STOP taking these medications       ciprofloxacin 750 MG tablet  Commonly known as:  CIPRO     clindamycin 300 MG capsule  Commonly known as:  CLEOCIN     hydrochlorothiazide 25 MG tablet  Commonly known as:  HYDRODIURIL      TAKE these medications       baclofen 10 MG tablet  Commonly known as:  LIORESAL  Take 10 mg by mouth 3 (three) times daily as needed (spasms).     citalopram 20 MG tablet  Commonly known as:  CELEXA  Take 40 mg by mouth at bedtime.     diazepam 10 MG tablet  Commonly known as:  VALIUM  Take 1 tablet (10 mg total) by mouth at bedtime.     imipramine 50 MG tablet  Commonly known as:  TOFRANIL  Take 50 mg by mouth at bedtime.     metroNIDAZOLE 500 MG tablet  Commonly known as:  FLAGYL  Take 1 tablet (500 mg total) by mouth every 6 (six) hours.     oxybutynin 15 MG 24 hr tablet  Commonly known as:  DITROPAN XL  Take 30 mg by mouth at bedtime.     sodium chloride 0.9 % SOLN 250 mL with ceftaroline 400 MG SOLR 300 mg  Inject 300 mg into the vein every 12 (twelve) hours.       Allergies  Allergen Reactions  . Sulfa Antibiotics Hives  . Levofloxacin Rash  . Meropenem Rash  . Penicillins Rash  . Zyvox [Linezolid] Rash      The results of significant  diagnostics from this hospitalization (including imaging, microbiology, ancillary and laboratory) are listed below for reference.    Significant Diagnostic Studies: US Renal  09/12/2013   CLINICAL DATA:  Acute renal failure  EXAM: RENAL/URINARY TRACT ULTRASOUND COMPLETE  COMPARISON:  None.  FINDINGS: Right Kidney  Length: 11.4 cm. Echogenicity within normal limits. No mass or hydronephrosis visualized.  Left Kidney  Length: 12.6 cm. Echogenicity within normal limits. No mass or hydronephrosis visualized.  Bladder  Not visualized; decompressed by Foley catheter.  IMPRESSION: No hydronephrosis.   Electronically Signed   By: Charline Bills M.D.   On: 09/12/2013 13:42   Dg Foot Complete Left  09/12/2013   CLINICAL DATA:  Soft tissue wounds and drainage. Rule out osteomyelitis.  EXAM: LEFT FOOT - COMPLETE 3+ VIEW  COMPARISON:  04/18/2012  FINDINGS: The mid and distal phalanx of the left 2nd toe did not well visualized, likely due tube bone destruction/osteomyelitis. No other visible changes of acute osteomyelitis.  IMPRESSION: Bone destruction involving the mid and distal phalanges of the left 2nd toe compatible with osteomyelitis.   Electronically Signed   By: Charlett Nose M.D.   On: 09/12/2013 20:24   Dg Foot Complete Right  09/12/2013   CLINICAL DATA:  Drainage, question osteomyelitis  EXAM: RIGHT FOOT COMPLETE - 3+ VIEW  COMPARISON:  None.  FINDINGS: No visible radiographic changes of osteomyelitis. No fracture. No subluxation or dislocation. Mild osteopenia.  IMPRESSION: No radiographic changes of osteomyelitis.   Electronically Signed   By: Charlett Nose M.D.   On: 09/12/2013 20:25    Microbiology: Recent Results (from the past 240 hour(s))  URINE CULTURE     Status: None   Collection Time    09/11/13  7:40 PM      Result Value Range Status   Specimen Description URINE, CATHETERIZED   Final   Special Requests NONE   Final   Culture  Setup Time     Final   Value: 09/11/2013 22:03      Performed at [facility] Count     Final   Value: >=100,000 COLONIES/ML     Performed at [facility]   Culture     Final   Value: ACINETOBACTER CALCOACETICUS/BAUMANNII COMPLEX     Performed at [facility]   Report Status 09/13/2013 FINAL   Final   Organism ID, Bacteria ACINETOBACTER CALCOACETICUS/BAUMANNII COMPLEX   Final  MRSA PCR SCREENING     Status: Abnormal   Collection Time    09/11/13 11:46 PM      Result Value Range Status   MRSA by PCR POSITIVE (*) NEGATIVE Final   Comment:            The GeneXpert MRSA Assay (FDA     approved for NASAL specimens     only), is one component of a     comprehensive MRSA colonization     surveillance program. It is not     intended to diagnose MRSA     infection nor to guide or     monitor treatment for     MRSA infections.     RESULT CALLED TO, READ BACK BY AND VERIFIED WITH:     CALLED TO RN Elsie Lincoln [id number] @[id number]  THANEY  SURGICAL PCR SCREEN     Status: Abnormal   Collection Time    09/13/13  8:33 PM      Result Value Range Status   MRSA, PCR POSITIVE (*) NEGATIVE Final   Comment: RESULT CALLED TO, READ BACK BY AND VERIFIED WITH:     Sandi Raveling [id number]   Staphylococcus aureus POSITIVE (*) NEGATIVE Final   Comment:            The Xpert SA Assay (FDA     approved for NASAL specimens     in patients over 50 years of age),     is one component of     a comprehensive surveillance     program.  Test performance has     been validated by [employer] for patients greater     than  or equal to 11 year old.     It is not intended     to diagnose infection nor to     guide or monitor treatment.     Labs: Basic Metabolic Panel:  Recent Labs Lab 09/16/13 0700 09/17/13 0600 09/18/13 0351 09/19/13 0549 09/20/13 0510  NA 141 140 139 139 143  K 3.8 4.4 4.1 3.7 3.7  CL 108 107 103 105 106  CO2 21 19 25 26 27   GLUCOSE 100* 74 79 95 87  BUN 27* 21 16 15 15   CREATININE 3.44*  2.78* 2.35* 1.98* 1.88*  CALCIUM 7.6* 7.9* 7.9* 7.7* 7.6*   Liver Function Tests: No results found for this basename: AST, ALT, ALKPHOS, BILITOT, PROT, ALBUMIN,  in the last 168 hours No results found for this basename: LIPASE, AMYLASE,  in the last 168 hours No results found for this basename: AMMONIA,  in the last 168 hours CBC:  Recent Labs Lab 09/14/13 0522 09/15/13 0602 09/16/13 0700 09/17/13 0600 09/18/13 0351  WBC 11.9* 13.4* 12.7* 12.2* 12.0*  HGB 8.8* 9.2* 9.8* 10.7* 10.6*  HCT 26.3* 27.8* 29.1* 32.1* 31.9*  MCV 77.8* 78.3 78.4 78.9 78.8  PLT 657* 646* 621* 584* 611*   Cardiac Enzymes: No results found for this basename: CKTOTAL, CKMB, CKMBINDEX, TROPONINI,  in the last 168 hours BNP: BNP (last 3 results) No results found for this basename: PROBNP,  in the last 8760 hours CBG: No results found for this basename: GLUCAP,  in the last 168 hours     Signed:  Benjamine Mola, Anastasios Melander  [facility] 09/20/2013, 7:50 AM

## 2013-09-20 NOTE — Progress Notes (Signed)
Patient discharge teaching given, including activity, diet, follow-up appoints, and medications. Patient verbalized understanding of all discharge instructions. PICC left in place for home antibiotics. Vitals are stable. Skin is intact except as charted in most recent assessments. Pt to be escorted out by family, to be driven home by family.  Peri Maris, MBA, BS, RN

## 2013-09-25 ENCOUNTER — Ambulatory Visit: Payer: PRIVATE HEALTH INSURANCE | Admitting: Internal Medicine

## 2013-10-01 ENCOUNTER — Ambulatory Visit: Payer: PRIVATE HEALTH INSURANCE | Admitting: Internal Medicine

## 2013-10-03 ENCOUNTER — Telehealth: Payer: Self-pay | Admitting: *Deleted

## 2013-10-03 ENCOUNTER — Ambulatory Visit (INDEPENDENT_AMBULATORY_CARE_PROVIDER_SITE_OTHER): Payer: PRIVATE HEALTH INSURANCE | Admitting: Internal Medicine

## 2013-10-03 ENCOUNTER — Encounter: Payer: Self-pay | Admitting: Internal Medicine

## 2013-10-03 VITALS — BP 135/74 | HR 93 | Temp 98.5°F

## 2013-10-03 DIAGNOSIS — M869 Osteomyelitis, unspecified: Secondary | ICD-10-CM

## 2013-10-03 NOTE — Telephone Encounter (Signed)
Brayton Caves, [facility] RN, called re: patient's worsening amputation wound.  Per the nurse, the amputation site is expanding.  The serosanguinous drainage has recently become creamy.  Patient is using a xeroform dressing on the site which should not be causing this drainage.  RN also states that the patient is reporting dark urine without foul odor.  RN has attempted to contact the surgeon and wound care for advice, but was unsuccessful (no follow up scheduled at the surgeon, wound care unaware of his amputation and wouldn't give advice).  Patient no-showed original follow up appointment on 10/01/13.  Alesia Morin, CMA spoke with the patient and emphasized the importance of keeping his follow up appointment with Dr. Orvan Falconer today to address these issues. Pt verbalized agreement. Andree Coss, RN
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
## 2013-10-04 ENCOUNTER — Encounter: Payer: Self-pay | Admitting: *Deleted

## 2013-10-04 ENCOUNTER — Telehealth: Payer: Self-pay | Admitting: *Deleted

## 2013-10-04 NOTE — Telephone Encounter (Signed)
Pt's Wound Clinic appointment was moved up to 10/08/13 from 10/11/13 per dr. Blair Dolphin request.  Per [facility] the pt MUST keep this appt.  He has either cancelled or not kept previous appt.  Pt called and information shared about the importance of keeping this appt.  Pt verbalized understanding.

## 2013-10-05 LAB — WOUND CULTURE
Gram Stain: NONE SEEN
Gram Stain: NONE SEEN
Organism ID, Bacteria: NO GROWTH

## 2013-10-08 ENCOUNTER — Telehealth: Payer: Self-pay | Admitting: *Deleted

## 2013-10-08 NOTE — Telephone Encounter (Signed)
[facility] called and advised that they were not able to draw from the patient PICC today. They have ordered Cathflow and will return to flush and try to draw labs on Wednesday 10/10/13 and they just wanted the doctor to know. Advised will let Dr Drue Second know as he has an appt with her on 10/11/13.

## 2013-10-11 ENCOUNTER — Ambulatory Visit (INDEPENDENT_AMBULATORY_CARE_PROVIDER_SITE_OTHER): Payer: PRIVATE HEALTH INSURANCE | Admitting: Internal Medicine

## 2013-10-11 ENCOUNTER — Telehealth: Payer: Self-pay | Admitting: *Deleted

## 2013-10-11 ENCOUNTER — Encounter: Payer: Self-pay | Admitting: Internal Medicine

## 2013-10-11 VITALS — BP 143/81 | HR 85 | Temp 97.8°F

## 2013-10-11 DIAGNOSIS — L8990 Pressure ulcer of unspecified site, unspecified stage: Secondary | ICD-10-CM

## 2013-10-11 DIAGNOSIS — M869 Osteomyelitis, unspecified: Secondary | ICD-10-CM

## 2013-10-11 DIAGNOSIS — L089 Local infection of the skin and subcutaneous tissue, unspecified: Secondary | ICD-10-CM

## 2013-10-11 NOTE — Telephone Encounter (Signed)
Patient in clinic today and report that PICC was flushing and infusing fine and he is able to get is medication in normally. Faxed new orders to [facility] to have them see the patient 3x a week instead of 1x a week and to try and coordinate that with his Wound care visits and wrap the wound per their orders.

## 2013-10-11 NOTE — Telephone Encounter (Signed)
[facility] RN requesting [facility] to try removing extension when pt is in office so that CathFlo may be tried this afternoon.  [facility] RN requesting a call back after office visit to let her know whether to go back out this afternoon.  Pt may need to go to hospital for PICC evaluation if the extension cannot be removed.

## 2013-10-11 NOTE — Telephone Encounter (Signed)
Can you do an order for cathflo under my name if the home health RN feels that they need it for his antibiotics

## 2013-10-15 NOTE — Telephone Encounter (Signed)
I'm assuming that the reason that the [facility] RN could not do the CathFlo is that she can not put it through the extension set.  The CathFlo is placed directly through the PICC.  [facility] protocol already has CathFlo as a PRN.
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
## 2013-10-30 ENCOUNTER — Telehealth: Payer: Self-pay | Admitting: Licensed Clinical Social Worker

## 2013-10-30 NOTE — Telephone Encounter (Signed)
Brayton Caves, RN from [facility] called stating that the patient was sent to [facility] ED from the wound care center yesterday due to wound infection not getting better. RN reports that the ED consulted with [facility] ID provider and stopped his current antibiotics for 1 week, per patient ID suspects he has a bone infection. Patient has not had any antibiotics since Sunday 10/27/13. Patient is scheduled for a bone biopsy next week at [facility]. RN also reports that patient has some redness at the suture site of his picc line, but no drainage.

## 2013-11-05 ENCOUNTER — Encounter: Payer: Self-pay | Admitting: Internal Medicine

## 2013-11-06 ENCOUNTER — Telehealth: Payer: Self-pay | Admitting: Licensed Clinical Social Worker

## 2013-11-06 NOTE — Telephone Encounter (Signed)
[name] Caves, RN reports patient has had his biopsy at [facility], the plastic surgeon took him of Teflaro for 1 week until after the biopsy. Patient was advised by the plastic surgeon that he could restart the antibiotic. [facility] called today wanting an order from Dr. Drue Second to send more medication to his home. Please advise.

## 2013-11-06 NOTE — Telephone Encounter (Signed)
[name] will call [facility] to let them know ok to restart teflaro and follow up on culture results from [facility]

## 2013-11-08 ENCOUNTER — Encounter: Payer: Self-pay | Admitting: *Deleted

## 2013-11-08 ENCOUNTER — Telehealth: Payer: Self-pay | Admitting: *Deleted

## 2013-11-08 NOTE — Telephone Encounter (Signed)
Per Dr Drue Second called [facility] to have them restart the patient Telflora, because the original stop date was 10/30/13 had them extend until we see the patient 11/29/13 and we will asses at that time and give them a call back.

## 2013-11-08 NOTE — Telephone Encounter (Signed)
Opened in error

## 2013-11-09 ENCOUNTER — Telehealth: Payer: Self-pay | Admitting: *Deleted

## 2013-11-09 NOTE — Telephone Encounter (Signed)
Dr. Dawna Part received results from pt's bone biopsy and wound culture.  The cultures grew staph aureus --  oxacillin resistant and vancomycin susceptible.  Results faxed and placed in Dr. Feliz Beam box.  Dr. Dawna Part office needs orders. Please call [phone]. Andree Coss, RN

## 2013-11-13 ENCOUNTER — Telehealth: Payer: Self-pay | Admitting: *Deleted

## 2013-11-13 ENCOUNTER — Other Ambulatory Visit: Payer: Self-pay | Admitting: *Deleted

## 2013-11-13 MED ORDER — SODIUM CHLORIDE 0.9 % IV SOLN: 8.0000 mg/kg | INTRAVENOUS | Status: DC

## 2013-11-13 NOTE — Telephone Encounter (Signed)
Call from [place], [place] with [facility] regarding patient's lab results, platelets high at 644. Dr. Drue Second informed and wanted to know when patient's next appt was with plastic surgeon, he has an appt tomorrow. Called Dr. Earle Gell office and let them know I was faxing over his lab results for Dr. Dawna Part review. Wendall Mola CMA

## 2013-11-29 ENCOUNTER — Encounter: Payer: Self-pay | Admitting: Internal Medicine

## 2013-11-29 ENCOUNTER — Ambulatory Visit (INDEPENDENT_AMBULATORY_CARE_PROVIDER_SITE_OTHER): Payer: PRIVATE HEALTH INSURANCE | Admitting: Internal Medicine

## 2013-11-29 VITALS — BP 138/85 | HR 110 | Temp 98.3°F

## 2013-11-29 DIAGNOSIS — M869 Osteomyelitis, unspecified: Secondary | ICD-10-CM

## 2013-11-29 DIAGNOSIS — A4902 Methicillin resistant Staphylococcus aureus infection, unspecified site: Secondary | ICD-10-CM
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
## 2013-12-07 ENCOUNTER — Encounter: Payer: Self-pay | Admitting: Internal Medicine

## 2013-12-18 ENCOUNTER — Telehealth: Payer: Self-pay | Admitting: *Deleted

## 2013-12-18 NOTE — Telephone Encounter (Signed)
Patient given appointment at [facility] wound clinic for Friday 1/30.  RN advised him that if his symptoms worsen in the meantime to go to the ED for evaluation.  Pt verbalized agreement. Landis Gandy, RN

## 2013-12-18 NOTE — Telephone Encounter (Signed)
Patient's homehealth RN [name] called stating that she had been trying to get the patient into [facility] since Friday 1/23. She is there now to draw labs and do dressing changes.  HHRN states that the patient has had increased drainage from his wounds, the drainage has changed character, pt has had a low grade temperature of 100.6 for the last 3 days, and that he may have s/s of a UTI.  RN called Dr. Baxter Flattery, relayed the situation.  Dr. Baxter Flattery advised for the [facility] to collect UA, urine cx and to have the patient return to [facility] for assessment.  RN contacted the Wound Care center's triage line, relayed the information and requested that they contact the patient/HHRN for more information and to have him assessed by a physician.  The triage nurse stated that the patient's primary physician was not available, but would bring the information to the MD on call.  Triage RN stated she would contact the patient with appointment information. Landis Gandy, RN

## 2013-12-19 ENCOUNTER — Telehealth: Payer: Self-pay | Admitting: *Deleted

## 2013-12-19 NOTE — Telephone Encounter (Signed)
Call from [name], RN with [facility]. When she went to draw labs on patient yesterday the blood was mistakenly left out too long. She is going to go today to redraw the labs. Myrtis Hopping CMA

## 2013-12-20 ENCOUNTER — Emergency Department (HOSPITAL_COMMUNITY): Payer: PRIVATE HEALTH INSURANCE

## 2013-12-20 ENCOUNTER — Telehealth: Payer: Self-pay | Admitting: *Deleted

## 2013-12-20 ENCOUNTER — Inpatient Hospital Stay (HOSPITAL_COMMUNITY)
Admission: EM | Admit: 2013-12-20 | Discharge: 2013-12-24 | DRG: 540 | Disposition: A | Discharge status: Home Health | Payer: PRIVATE HEALTH INSURANCE | Attending: Family Medicine | Admitting: Family Medicine

## 2013-12-20 ENCOUNTER — Encounter (HOSPITAL_COMMUNITY): Payer: Self-pay | Admitting: Emergency Medicine

## 2013-12-20 DIAGNOSIS — D75839 Thrombocytosis, unspecified: Secondary | ICD-10-CM

## 2013-12-20 DIAGNOSIS — G8220 Paraplegia, unspecified: Secondary | ICD-10-CM | POA: Diagnosis present

## 2013-12-20 DIAGNOSIS — Z993 Dependence on wheelchair: Secondary | ICD-10-CM

## 2013-12-20 DIAGNOSIS — S8291XA Unspecified fracture of right lower leg, initial encounter for closed fracture: Secondary | ICD-10-CM

## 2013-12-20 DIAGNOSIS — Z833 Family history of diabetes mellitus: Secondary | ICD-10-CM

## 2013-12-20 DIAGNOSIS — D649 Anemia, unspecified: Secondary | ICD-10-CM

## 2013-12-20 DIAGNOSIS — Z87891 Personal history of nicotine dependence: Secondary | ICD-10-CM

## 2013-12-20 DIAGNOSIS — Z888 Allergy status to other drugs, medicaments and biological substances status: Secondary | ICD-10-CM

## 2013-12-20 DIAGNOSIS — G4733 Obstructive sleep apnea (adult) (pediatric): Secondary | ICD-10-CM | POA: Diagnosis present

## 2013-12-20 DIAGNOSIS — D72829 Elevated white blood cell count, unspecified: Secondary | ICD-10-CM

## 2013-12-20 DIAGNOSIS — S24103A Unspecified injury at T7-T10 level of thoracic spinal cord, initial encounter: Secondary | ICD-10-CM

## 2013-12-20 DIAGNOSIS — L0390 Cellulitis, unspecified: Secondary | ICD-10-CM | POA: Insufficient documentation

## 2013-12-20 DIAGNOSIS — Z880 Allergy status to penicillin: Secondary | ICD-10-CM

## 2013-12-20 DIAGNOSIS — D473 Essential (hemorrhagic) thrombocythemia: Secondary | ICD-10-CM

## 2013-12-20 DIAGNOSIS — L03119 Cellulitis of unspecified part of limb: Secondary | ICD-10-CM

## 2013-12-20 DIAGNOSIS — Z9089 Acquired absence of other organs: Secondary | ICD-10-CM

## 2013-12-20 DIAGNOSIS — Z881 Allergy status to other antibiotic agents status: Secondary | ICD-10-CM

## 2013-12-20 DIAGNOSIS — M62838 Other muscle spasm: Secondary | ICD-10-CM | POA: Diagnosis present

## 2013-12-20 DIAGNOSIS — A419 Sepsis, unspecified organism: Secondary | ICD-10-CM

## 2013-12-20 DIAGNOSIS — M869 Osteomyelitis, unspecified: Principal | ICD-10-CM | POA: Diagnosis present

## 2013-12-20 DIAGNOSIS — L089 Local infection of the skin and subcutaneous tissue, unspecified: Secondary | ICD-10-CM

## 2013-12-20 DIAGNOSIS — N319 Neuromuscular dysfunction of bladder, unspecified: Secondary | ICD-10-CM | POA: Diagnosis present

## 2013-12-20 DIAGNOSIS — IMO0002 Reserved for concepts with insufficient information to code with codable children: Secondary | ICD-10-CM

## 2013-12-20 DIAGNOSIS — Z9081 Acquired absence of spleen: Secondary | ICD-10-CM

## 2013-12-20 DIAGNOSIS — N179 Acute kidney failure, unspecified: Secondary | ICD-10-CM

## 2013-12-20 DIAGNOSIS — L8990 Pressure ulcer of unspecified site, unspecified stage: Secondary | ICD-10-CM | POA: Diagnosis present

## 2013-12-20 DIAGNOSIS — Z86718 Personal history of other venous thrombosis and embolism: Secondary | ICD-10-CM

## 2013-12-20 DIAGNOSIS — Z882 Allergy status to sulfonamides status: Secondary | ICD-10-CM

## 2013-12-20 DIAGNOSIS — L89899 Pressure ulcer of other site, unspecified stage: Secondary | ICD-10-CM | POA: Diagnosis present

## 2013-12-20 LAB — CBC WITH DIFFERENTIAL/PLATELET
Basophils Absolute: 0 10*3/uL (ref 0.0–0.1)
Basophils Relative: 0 % (ref 0–1)
Eosinophils Absolute: 0.1 10*3/uL (ref 0.0–0.7)
Eosinophils Relative: 1 % (ref 0–5)
HCT: 35.5 % — ABNORMAL LOW (ref 39.0–52.0)
Hemoglobin: 12 g/dL — ABNORMAL LOW (ref 13.0–17.0)
Lymphocytes Relative: 19 % (ref 12–46)
Lymphs Abs: 2.7 10*3/uL (ref 0.7–4.0)
MCH: 25.9 pg — ABNORMAL LOW (ref 26.0–34.0)
MCHC: 33.8 g/dL (ref 30.0–36.0)
MCV: 76.7 fL — ABNORMAL LOW (ref 78.0–100.0)
Monocytes Absolute: 1.6 10*3/uL — ABNORMAL HIGH (ref 0.1–1.0)
Monocytes Relative: 11 % (ref 3–12)
Neutro Abs: 9.7 10*3/uL — ABNORMAL HIGH (ref 1.7–7.7)
Neutrophils Relative %: 69 % (ref 43–77)
Platelets: 616 10*3/uL — ABNORMAL HIGH (ref 150–400)
RBC: 4.63 MIL/uL (ref 4.22–5.81)
RDW: 15.1 % (ref 11.5–15.5)
WBC: 14.1 10*3/uL — ABNORMAL HIGH (ref 4.0–10.5)

## 2013-12-20 LAB — BASIC METABOLIC PANEL
BUN: 6 mg/dL (ref 6–23)
CO2: 25 mEq/L (ref 19–32)
Calcium: 8.7 mg/dL (ref 8.4–10.5)
Chloride: 102 mEq/L (ref 96–112)
Creatinine, Ser: 0.66 mg/dL (ref 0.50–1.35)
GFR calc Af Amer: >90 mL/min (ref >90)
GFR calc non Af Amer: >90 mL/min (ref >90)
Glucose, Bld: 94 mg/dL (ref 70–99)
Potassium: 4.3 mEq/L (ref 3.7–5.3)
Sodium: 143 mEq/L (ref 137–147)

## 2013-12-20 LAB — HEPATIC FUNCTION PANEL
ALT: 13 U/L (ref 0–53)
AST: 18 U/L (ref 0–37)
Albumin: 3 g/dL — ABNORMAL LOW (ref 3.5–5.2)
Alkaline Phosphatase: 101 U/L (ref 39–117)
Bilirubin, Direct: <0.2 mg/dL (ref 0.0–0.3)
Total Bilirubin: 0.2 mg/dL — ABNORMAL LOW (ref 0.3–1.2)
Total Protein: 8.8 g/dL — ABNORMAL HIGH (ref 6.0–8.3)

## 2013-12-20 MED ORDER — ONDANSETRON HCL 4 MG PO TABS: 4.0000 mg | ORAL_TABLET | Freq: Four times a day (QID) | ORAL | Status: DC | PRN

## 2013-12-20 MED ORDER — OXYBUTYNIN CHLORIDE ER 15 MG PO TB24
30.0000 mg | ORAL_TABLET | Freq: Every day | ORAL | Status: DC
  Administered 2013-12-21 – 2013-12-23 (×3): 30 mg via ORAL
  Filled 2013-12-20 (×7): qty 2

## 2013-12-20 MED ORDER — SERTRALINE HCL 50 MG PO TABS
50.0000 mg | ORAL_TABLET | ORAL | Status: DC
  Administered 2013-12-21 – 2013-12-24 (×4): 50 mg via ORAL
  Filled 2013-12-20 (×6): qty 1

## 2013-12-20 MED ORDER — BACLOFEN 20 MG PO TABS
30.0000 mg | ORAL_TABLET | Freq: Three times a day (TID) | ORAL | Status: DC | PRN
  Filled 2013-12-20: qty 1

## 2013-12-20 MED ORDER — HYDROCODONE-ACETAMINOPHEN 5-325 MG PO TABS: 1.0000 | ORAL_TABLET | ORAL | Status: DC | PRN

## 2013-12-20 MED ORDER — DIAZEPAM 5 MG PO TABS: 10.0000 mg | ORAL_TABLET | Freq: Three times a day (TID) | ORAL | Status: DC | PRN

## 2013-12-20 MED ORDER — ENOXAPARIN SODIUM 40 MG/0.4ML ~~LOC~~ SOLN
40.0000 mg | SUBCUTANEOUS | Status: DC
  Administered 2013-12-21 – 2013-12-24 (×4): 40 mg via SUBCUTANEOUS
  Filled 2013-12-20 (×6): qty 0.4

## 2013-12-20 MED ORDER — SODIUM CHLORIDE 0.9 % IV SOLN
INTRAVENOUS | Status: DC
  Administered 2013-12-20 – 2013-12-23 (×6): via INTRAVENOUS

## 2013-12-20 MED ORDER — SODIUM CHLORIDE 0.9 % IJ SOLN
10.0000 mL | INTRAMUSCULAR | Status: DC | PRN
  Administered 2013-12-23 – 2013-12-24 (×2): 10 mL

## 2013-12-20 MED ORDER — SODIUM CHLORIDE 0.9 % IV BOLUS (SEPSIS)
1000.0000 mL | Freq: Once | INTRAVENOUS | Status: AC
  Administered 2013-12-20: 1000 mL via INTRAVENOUS

## 2013-12-20 MED ORDER — ACETAMINOPHEN 325 MG PO TABS: 650.0000 mg | ORAL_TABLET | Freq: Four times a day (QID) | ORAL | Status: DC | PRN

## 2013-12-20 MED ORDER — VANCOMYCIN HCL IN DEXTROSE 1-5 GM/200ML-% IV SOLN
1000.0000 mg | Freq: Once | INTRAVENOUS | Status: AC
  Administered 2013-12-20: 1000 mg via INTRAVENOUS
  Filled 2013-12-20: qty 200

## 2013-12-20 MED ORDER — IMIPRAMINE HCL 50 MG PO TABS
50.0000 mg | ORAL_TABLET | Freq: Every day | ORAL | Status: DC
  Administered 2013-12-21 – 2013-12-23 (×3): 50 mg via ORAL
  Filled 2013-12-20 (×7): qty 1

## 2013-12-20 MED ORDER — ACETAMINOPHEN 650 MG RE SUPP: 650.0000 mg | Freq: Four times a day (QID) | RECTAL | Status: DC | PRN

## 2013-12-20 MED ORDER — ONDANSETRON HCL 4 MG/2ML IJ SOLN: 4.0000 mg | Freq: Four times a day (QID) | INTRAMUSCULAR | Status: DC | PRN

## 2013-12-20 MED ORDER — VANCOMYCIN HCL 500 MG IV SOLR
500.0000 mg | Freq: Once | INTRAVENOUS | Status: AC
  Administered 2013-12-21: 500 mg via INTRAVENOUS
  Filled 2013-12-20: qty 500

## 2013-12-20 MED ORDER — VANCOMYCIN HCL IN DEXTROSE 1-5 GM/200ML-% IV SOLN
1000.0000 mg | Freq: Two times a day (BID) | INTRAVENOUS | Status: DC
  Administered 2013-12-21 – 2013-12-23 (×5): 1000 mg via INTRAVENOUS
  Filled 2013-12-20 (×6): qty 200

## 2013-12-20 NOTE — ED Notes (Signed)
Report called to Carla, RN. 

## 2013-12-20 NOTE — ED Notes (Signed)
MD at bedside. 

## 2013-12-20 NOTE — ED Notes (Signed)
Patient given in/out cath kit to self cath.

## 2013-12-20 NOTE — ED Notes (Signed)
Pt reports history of chronic leg infections. Pt states swelling, redness and warmth to left leg since today. Pt paralyzed waist down and in wheel chair. Sent by MD for antibiotics. Pt has PICC in right arm. Pt has been receiving Cubicin antibiotic once a day.
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
## 2013-12-20 NOTE — Telephone Encounter (Signed)
[name], RN with [facility] called stating patient's infection on left foot has worsened and his foot is twice the normal size, warm to the touch and his temp is 99.7. Advised that patient needs to be evaluated at the ED. [name]

## 2013-12-20 NOTE — ED Notes (Signed)
Patient has a wound to his left lower extremity measuring from his ankle up his lateral leg apprx 3-4 cm long that is red/white and small area of green, packing has been removed. He has an area that is down to the bone that is half dollar in size. Bilateral wounds between his toes that are red/white with slight drainage on the dressing that is yellow. He is missing one toe on each foot.
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
## 2013-12-21 DIAGNOSIS — L089 Local infection of the skin and subcutaneous tissue, unspecified: Secondary | ICD-10-CM

## 2013-12-21 DIAGNOSIS — L89899 Pressure ulcer of other site, unspecified stage: Secondary | ICD-10-CM

## 2013-12-21 DIAGNOSIS — D72829 Elevated white blood cell count, unspecified: Secondary | ICD-10-CM

## 2013-12-21 DIAGNOSIS — L0390 Cellulitis, unspecified: Secondary | ICD-10-CM

## 2013-12-21 DIAGNOSIS — L0291 Cutaneous abscess, unspecified: Secondary | ICD-10-CM

## 2013-12-21 DIAGNOSIS — M869 Osteomyelitis, unspecified: Secondary | ICD-10-CM

## 2013-12-21 DIAGNOSIS — L8990 Pressure ulcer of unspecified site, unspecified stage: Secondary | ICD-10-CM

## 2013-12-21 DIAGNOSIS — G8220 Paraplegia, unspecified: Secondary | ICD-10-CM

## 2013-12-21 DIAGNOSIS — Z86718 Personal history of other venous thrombosis and embolism: Secondary | ICD-10-CM

## 2013-12-21 DIAGNOSIS — M7989 Other specified soft tissue disorders: Secondary | ICD-10-CM

## 2013-12-21 LAB — COMPREHENSIVE METABOLIC PANEL
ALT: 15 U/L (ref 0–53)
AST: 17 U/L (ref 0–37)
Albumin: 2.8 g/dL — ABNORMAL LOW (ref 3.5–5.2)
Alkaline Phosphatase: 91 U/L (ref 39–117)
BUN: 5 mg/dL — ABNORMAL LOW (ref 6–23)
CO2: 26 mEq/L (ref 19–32)
Calcium: 8 mg/dL — ABNORMAL LOW (ref 8.4–10.5)
Chloride: 103 mEq/L (ref 96–112)
Creatinine, Ser: 0.64 mg/dL (ref 0.50–1.35)
GFR calc Af Amer: >90 mL/min (ref >90)
GFR calc non Af Amer: >90 mL/min (ref >90)
Glucose, Bld: 93 mg/dL (ref 70–99)
Potassium: 4 mEq/L (ref 3.7–5.3)
Sodium: 141 mEq/L (ref 137–147)
Total Bilirubin: 0.2 mg/dL — ABNORMAL LOW (ref 0.3–1.2)
Total Protein: 8 g/dL (ref 6.0–8.3)

## 2013-12-21 LAB — CBC
HCT: 31 % — ABNORMAL LOW (ref 39.0–52.0)
Hemoglobin: 10.3 g/dL — ABNORMAL LOW (ref 13.0–17.0)
MCH: 25.9 pg — ABNORMAL LOW (ref 26.0–34.0)
MCHC: 33.2 g/dL (ref 30.0–36.0)
MCV: 77.9 fL — ABNORMAL LOW (ref 78.0–100.0)
Platelets: 573 10*3/uL — ABNORMAL HIGH (ref 150–400)
RBC: 3.98 MIL/uL — ABNORMAL LOW (ref 4.22–5.81)
RDW: 15 % (ref 11.5–15.5)
WBC: 12.3 10*3/uL — ABNORMAL HIGH (ref 4.0–10.5)

## 2013-12-21 LAB — SEDIMENTATION RATE: Sed Rate: 100 mm/hr — ABNORMAL HIGH (ref 0–16)

## 2013-12-21 LAB — PROTIME-INR
INR: 1.11 (ref 0.00–1.49)
Prothrombin Time: 14.1 seconds (ref 11.6–15.2)

## 2013-12-21 LAB — PREALBUMIN: Prealbumin: 14.9 mg/dL — ABNORMAL LOW (ref 17.0–34.0)

## 2013-12-21 LAB — C-REACTIVE PROTEIN: CRP: 9.9 mg/dL — ABNORMAL HIGH (ref <0.60)

## 2013-12-21 LAB — MRSA PCR SCREENING: MRSA by PCR: NEGATIVE
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
## 2013-12-21 NOTE — ED Provider Notes (Signed)
Medical screening examination/treatment/procedure(s) were conducted as a shared visit with non-physician practitioner(s) and myself.  I personally evaluated the patient during the encounter.    Pt here for cellulitis of leg.  pe cellulitis left leg,  Will admit  Maudry Diego, MD 12/21/13 [phone]

## 2013-12-21 NOTE — Consult Note (Signed)
WOC wound consult note Reason for Consult: Chronic non-healing ulcerations of the bilateral lower extremities.  Patient has been treated at [facility] outpatient wound clinic and has a follow up appointment there next week.  Many therapeutic modalities have been attempted including NPWT (V.A.C. Therapy, collagenase, hypochlorite solution (Dakin's) dressings and silver without impact.  Since the discontinuance of NPWT, patient noted an increase in drainage.  He notes that he does not keep LE elevated "as much as they want". Wound type: pressure (left lateral LE) with venous insufficiency; interdigital ulcers are not pressure, infectious Pressure Ulcer POA: Yes  Measurement:Left lateral LE ulceration measures 16 cm x 5 cm x 0.4cm and is 80% pink, non-granulating tissue with 20% isolated areas of necrotic slough.  The left foot, interdigital area between the great (hallux) and second digit measures 10cm x 3cm and presents a 100% covered by light brown eschar (depth obscured).  The right foot, interdigital space between the great (hallux) and second digit measures 9 cm x 1 cm x 0.4cm and has a non-healing moist base with dried serum surrounding. Wound bed:As described above. Drainage (amount, consistency, odor):  No odor.  Serous exudate from left lateral LE, no exudate from left interdigital wound and a small amount of light yellow exudate from the right foot, interdigital wound. Periwound: Erythema surrounds all wounds with light warmth. Dressing procedure/placement/frequency: I will implement conservative twice daily saline dressings to all wounds today and provide a pressure redistribution boot for the left LE so that the lateral rotation of the LE does not present continuous pressure to the wound.  Suggest consideration of orthopedic consultation and/or consultation with patient's physicians at [facility] for collaborative plan.  [facility] nursing team will not follow, but will remain  available to this patient, the nursing and medical teams.  Please re-consult if needed. Thanks, Maudie Flakes, MSN, RN, [place], [place], [place] [phone])

## 2013-12-21 NOTE — Progress Notes (Signed)
*  PRELIMINARY RESULTS* Vascular Ultrasound Lower extremity venous duplex has been completed.  Preliminary findings: No evidence of Acute DVT bilaterally. There is a small echogenic area in the left popliteal vein that could represent chronic thrombus. Incidental finding = enlarged, hypervascular left inguinal lymph node measuring >5cm.  Landry Mellow, RDMS, RVT  12/21/2013, 2:08 PM

## 2013-12-21 NOTE — Progress Notes (Signed)
Utilization review completed. Natsumi Whitsitt, RN, BSN. 

## 2013-12-21 NOTE — Progress Notes (Signed)
    PHYSICIAN PROGRESS NOTE  [name] RDE:[id number] DOB: 10-05-1986 DOA: 12/20/2013  12/21/2013   Pt was seen and examined.  Wound was examined.  H&P and orders reviewed.  Pt reports that he is not having chills any longer.  ID consulted.  May need surgical consult. Will await for ID recs.     Jericho Cieslik [employer] [phone]. If 7PM-7AM, please contact night-coverage at [email or web address], password [facility] 12/21/2013, 11:21 AM  LOS: 1 day

## 2013-12-22 DIAGNOSIS — S24103A Unspecified injury at T7-T10 level of thoracic spinal cord, initial encounter: Secondary | ICD-10-CM

## 2013-12-22 DIAGNOSIS — M869 Osteomyelitis, unspecified: Principal | ICD-10-CM

## 2013-12-22 NOTE — Plan of Care (Signed)
Problem: Phase I Progression Outcomes Goal: Pain controlled with appropriate interventions Outcome: Adequate for Discharge Patient has not wanted or needed any pain medication Goal: Voiding-avoid urinary catheter unless indicated Outcome: Adequate for Discharge Patient has to self cath related to parapelgia of lower extremities

## 2013-12-22 NOTE — Progress Notes (Signed)
PHYSICIAN PROGRESS NOTE  [name] HYI:[id number] DOB: [birth year] DOA: 12/20/2013  12/22/2013   PCP: Default, Provider, MD  Assessment/Plan: 1. Osteomyelitis of toe of left foot  The patient is presenting with complaints of redness and swelling of his infected wound. He has been on daptomycin and has been treated with multiple antibiotics in the past. ED physician has discussed with on-call infectious disease who recommended to continue him on vancomycin IV fluids,  Would check ESR CRP  Blood cultures no growth to date  2. Paraplegia  Patient has chronic muscle spasm  At present I would continue him on Flexeril and Valium   3. History of DVT  Doppler ultrasound negative for DVT He had normal ABI 3-4 years ago   Consults: Infectious disease   DVT Prophylaxis: subcutaneous Heparin  Nutrition: Regular diet   Code Status: Full  Disposition: Admitted to inpatient in med-surge unit.  HPI/Subjective: Pt without complaints  Objective: Filed Vitals:   12/22/13 0601  BP: 109/57  Pulse: 56  Temp: 97.5 F (36.4 C)  Resp: 18    Intake/Output Summary (Last 24 hours) at 12/22/13 1323 Last data filed at 12/22/13 7867  Gross per 24 hour  Intake   3340 ml  Output   2300 ml  Net   1040 ml   Filed Weights   12/20/13 2235  Weight: 251 lb 12.3 oz (114.2 kg)   Exam:  General: Alert, Awake and Oriented to Time, Place and Person. Appear in mild distress  Eyes: PERRL ENT: Oral Mucosa clear moist.  Neck: No JVD  Cardiovascular: S1 and S2 Present, no Murmur, Peripheral Pulses Present  Respiratory: BBS shallow but clear No Crackles, no wheezes  Abdomen: Bowel Sound Present, Soft and Non tender  Extremities: Bilateral Pedal edema, no calf tenderness, left toe wound with sloughing and induration with redness and swelling spreading up to his foot presence of another ulcer on the left lateral part of the foot  Neurologic: Grossly Unremarkable.  Data Reviewed: Basic Metabolic  Panel:  Recent Labs Lab 12/20/13 1940 12/21/13 0413  NA 143 141  K 4.3 4.0  CL 102 103  CO2 25 26  GLUCOSE 94 93  BUN 6 5*  CREATININE 0.66 0.64  CALCIUM 8.7 8.0*   Liver Function Tests:  Recent Labs Lab 12/20/13 2246 12/21/13 0413  AST 18 17  ALT 13 15  ALKPHOS 101 91  BILITOT 0.2* 0.2*  PROT 8.8* 8.0  ALBUMIN 3.0* 2.8*   No results found for this basename: LIPASE, AMYLASE,  in the last 168 hours No results found for this basename: AMMONIA,  in the last 168 hours CBC:  Recent Labs Lab 12/20/13 1940 12/21/13 0413  WBC 14.1* 12.3*  NEUTROABS 9.7*  --   HGB 12.0* 10.3*  HCT 35.5* 31.0*  MCV 76.7* 77.9*  PLT 616* 573*   Cardiac Enzymes: No results found for this basename: CKTOTAL, CKMB, CKMBINDEX, TROPONINI,  in the last 168 hours BNP (last 3 results) No results found for this basename: PROBNP,  in the last 8760 hours CBG: No results found for this basename: GLUCAP,  in the last 168 hours  Recent Results (from the past 240 hour(s))  CULTURE, BLOOD (ROUTINE X 2)     Status: None   Collection Time    12/20/13  7:40 PM      Result Value Range Status   Specimen Description BLOOD FOREARM LEFT   Final   Special Requests BOTTLES DRAWN AEROBIC AND ANAEROBIC 5CC  Final   Culture  Setup Time     Final   Value: 12/21/2013 00:49     Performed at [facility]   Culture     Final   Value:        BLOOD CULTURE RECEIVED NO GROWTH TO DATE CULTURE WILL BE HELD FOR 5 DAYS BEFORE ISSUING A FINAL NEGATIVE REPORT     Performed at [facility]   Report Status PENDING   Incomplete  CULTURE, BLOOD (ROUTINE X 2)     Status: None   Collection Time    12/20/13  8:35 PM      Result Value Range Status   Specimen Description BLOOD FOREARM LEFT   Final   Special Requests BOTTLES DRAWN AEROBIC AND ANAEROBIC 10CC   Final   Culture  Setup Time     Final   Value: 12/21/2013 04:06     Performed at [facility]   Culture     Final   Value:        BLOOD  CULTURE RECEIVED NO GROWTH TO DATE CULTURE WILL BE HELD FOR 5 DAYS BEFORE ISSUING A FINAL NEGATIVE REPORT     Performed at [facility]   Report Status PENDING   Incomplete  MRSA PCR SCREENING     Status: None   Collection Time    12/20/13 10:43 PM      Result Value Range Status   MRSA by PCR NEGATIVE  NEGATIVE Final   Comment:            The GeneXpert MRSA Assay (FDA     approved for NASAL specimens     only), is one component of a     comprehensive MRSA colonization     surveillance program. It is not     intended to diagnose MRSA     infection nor to guide or     monitor treatment for     MRSA infections.     Studies: Dg Tibia/fibula Left  12/20/2013   CLINICAL DATA:  Ulcerations, paraplegia, soft tissue swelling  EXAM: LEFT TIBIA AND FIBULA - 2 VIEW  COMPARISON:  12/20/2013, 10/03/2011  FINDINGS: Normal alignment. Bones are osteopenic. Left tibia and fibula appear intact. No fracture evident. Diffuse lower extremity soft tissue swelling noted. No definite osseous destruction or bone loss. Healed deformity of the distal tibia and fibula from remote fractures.  IMPRESSION: Chronic changes as described.  No acute osseous finding.   Electronically Signed   By: Daryll Brod M.D.   On: 12/20/2013 18:23   Dg Foot Complete Left  12/20/2013   CLINICAL DATA:  Ulceration, paraplegia, wound infection  EXAM: LEFT FOOT - COMPLETE 3+ VIEW  COMPARISON:  09/12/2013  FINDINGS: Previous amputation of the left second toe. Bones are osteopenic. Diffuse dorsal soft tissue swelling on the lateral view. No gross malalignment or definite fracture.  IMPRESSION: Interval left second toe amputation.  Osteopenia  No other acute finding by plain radiography   Electronically Signed   By: Daryll Brod M.D.   On: 12/20/2013 18:21    Scheduled Meds: . enoxaparin (LOVENOX) injection  40 mg Subcutaneous Q24H  . imipramine  50 mg Oral QHS  . oxybutynin  30 mg Oral QHS  . sertraline  50 mg Oral BH-q7a  .  vancomycin  1,000 mg Intravenous Q12H   Continuous Infusions: . sodium chloride 75 mL/hr at 12/22/13 0319   Principal Problem:   Osteomyelitis of toe of left foot  Active Problems:   Decubitus ulcer of left leg   Paraplegia following spinal cord injury   History of DVT (deep vein thrombosis)   History of splenectomy  [facility] Pager [phone]. If 7PM-7AM, please contact night-coverage at [email or web address], password [facility] 12/22/2013, 1:23 PM  LOS: 2 days

## 2013-12-22 NOTE — Progress Notes (Signed)
[place] for Infectious Disease  Date of Admission:  12/20/2013  Antibiotics: Antibiotics Given (last 72 hours)   Date/Time Action Medication Dose Rate   12/21/13 0153 Given   vancomycin (VANCOCIN) 500 mg in sodium chloride 0.9 % 100 mL IVPB 500 mg 100 mL/hr   12/21/13 1019 Given   vancomycin (VANCOCIN) IVPB 1000 mg/200 mL premix 1,000 mg 200 mL/hr   12/21/13 2235 Given   vancomycin (VANCOCIN) IVPB 1000 mg/200 mL premix 1,000 mg 200 mL/hr   12/22/13 4098 Given   vancomycin (VANCOCIN) IVPB 1000 mg/200 mL premix 1,000 mg 200 mL/hr      Subjective: No acute events  Objective: Temp:  [97.5 F (36.4 C)-98.1 F (36.7 C)] 97.5 F (36.4 C) (01/31 0601) Pulse Rate:  [56-84] 56 (01/31 0601) Resp:  [18-20] 18 (01/31 0601) BP: (109-129)/(57-64) 109/57 mmHg (01/31 0601) SpO2:  [96 %-98 %] 97 % (01/31 0601)  General: nad  Lab Results Lab Results  Component Value Date   WBC 12.3* 12/21/2013   HGB 10.3* 12/21/2013   HCT 31.0* 12/21/2013   MCV 77.9* 12/21/2013   PLT 573* 12/21/2013    Lab Results  Component Value Date   CREATININE 0.64 12/21/2013   BUN 5* 12/21/2013   NA 141 12/21/2013   K 4.0 12/21/2013   CL 103 12/21/2013   CO2 26 12/21/2013    Lab Results  Component Value Date   ALT 15 12/21/2013   AST 17 12/21/2013   ALKPHOS 91 12/21/2013   BILITOT 0.2* 12/21/2013      Microbiology: Recent Results (from the past 240 hour(s))  CULTURE, BLOOD (ROUTINE X 2)     Status: None   Collection Time    12/20/13  7:40 PM      Result Value Range Status   Specimen Description BLOOD FOREARM LEFT   Final   Special Requests BOTTLES DRAWN AEROBIC AND ANAEROBIC 5CC   Final   Culture  Setup Time     Final   Value: 12/21/2013 00:49     Performed at [facility]   Culture     Final   Value:        BLOOD CULTURE RECEIVED NO GROWTH TO DATE CULTURE WILL BE HELD FOR 5 DAYS BEFORE ISSUING A FINAL NEGATIVE REPORT     Performed at [facility]   Report Status PENDING    Incomplete  CULTURE, BLOOD (ROUTINE X 2)     Status: None   Collection Time    12/20/13  8:35 PM      Result Value Range Status   Specimen Description BLOOD FOREARM LEFT   Final   Special Requests BOTTLES DRAWN AEROBIC AND ANAEROBIC 10CC   Final   Culture  Setup Time     Final   Value: 12/21/2013 04:06     Performed at [facility]   Culture     Final   Value:        BLOOD CULTURE RECEIVED NO GROWTH TO DATE CULTURE WILL BE HELD FOR 5 DAYS BEFORE ISSUING A FINAL NEGATIVE REPORT     Performed at [facility]   Report Status PENDING   Incomplete  MRSA PCR SCREENING     Status: None   Collection Time    12/20/13 10:43 PM      Result Value Range Status   MRSA by PCR NEGATIVE  NEGATIVE Final   Comment:            The  GeneXpert MRSA Assay (FDA     approved for NASAL specimens     only), is one component of a     comprehensive MRSA colonization     surveillance program. It is not     intended to diagnose MRSA     infection nor to guide or     monitor treatment for     MRSA infections.    Studies/Results: Dg Tibia/fibula Left  12/20/2013   CLINICAL DATA:  Ulcerations, paraplegia, soft tissue swelling  EXAM: LEFT TIBIA AND FIBULA - 2 VIEW  COMPARISON:  12/20/2013, 10/03/2011  FINDINGS: Normal alignment. Bones are osteopenic. Left tibia and fibula appear intact. No fracture evident. Diffuse lower extremity soft tissue swelling noted. No definite osseous destruction or bone loss. Healed deformity of the distal tibia and fibula from remote fractures.  IMPRESSION: Chronic changes as described.  No acute osseous finding.   Electronically Signed   By: Daryll Brod M.D.   On: 12/20/2013 18:23   Dg Foot Complete Left  12/20/2013   CLINICAL DATA:  Ulceration, paraplegia, wound infection  EXAM: LEFT FOOT - COMPLETE 3+ VIEW  COMPARISON:  09/12/2013  FINDINGS: Previous amputation of the left second toe. Bones are osteopenic. Diffuse dorsal soft tissue swelling on the lateral view. No  gross malalignment or definite fracture.  IMPRESSION: Interval left second toe amputation.  Osteopenia  No other acute finding by plain radiography   Electronically Signed   By: Daryll Brod M.D.   On: 12/20/2013 18:21    Assessment/Plan: 1) osteomyelitis - on vancomycin, WBC improving.  Needs orthopedic evaluation here or at [facility].  Has been on antibiotics essentially continuously for several months and not healing.    Scharlene Gloss, [place] for Infectious Disease [place] [email or web address] [phone] pager   [phone] cell 12/22/2013, 1:52 PM

## 2013-12-23 LAB — CBC
HCT: 30.8 % — ABNORMAL LOW (ref 39.0–52.0)
Hemoglobin: 10.1 g/dL — ABNORMAL LOW (ref 13.0–17.0)
MCH: 25.8 pg — ABNORMAL LOW (ref 26.0–34.0)
MCHC: 32.8 g/dL (ref 30.0–36.0)
MCV: 78.6 fL (ref 78.0–100.0)
Platelets: 600 10*3/uL — ABNORMAL HIGH (ref 150–400)
RBC: 3.92 MIL/uL — ABNORMAL LOW (ref 4.22–5.81)
RDW: 15.1 % (ref 11.5–15.5)
WBC: 8.4 10*3/uL (ref 4.0–10.5)

## 2013-12-23 LAB — BASIC METABOLIC PANEL
BUN: 6 mg/dL (ref 6–23)
CO2: 25 mEq/L (ref 19–32)
Calcium: 8.2 mg/dL — ABNORMAL LOW (ref 8.4–10.5)
Chloride: 103 mEq/L (ref 96–112)
Creatinine, Ser: 0.67 mg/dL (ref 0.50–1.35)
GFR calc Af Amer: >90 mL/min (ref >90)
GFR calc non Af Amer: >90 mL/min (ref >90)
Glucose, Bld: 104 mg/dL — ABNORMAL HIGH (ref 70–99)
Potassium: 3.7 mEq/L (ref 3.7–5.3)
Sodium: 140 mEq/L (ref 137–147)

## 2013-12-23 LAB — VANCOMYCIN, TROUGH: Vancomycin Tr: 8.2 ug/mL — ABNORMAL LOW (ref 10.0–20.0)

## 2013-12-23 MED ORDER — VANCOMYCIN HCL IN DEXTROSE 1-5 GM/200ML-% IV SOLN
1000.0000 mg | Freq: Three times a day (TID) | INTRAVENOUS | Status: DC
  Administered 2013-12-23 – 2013-12-24 (×3): 1000 mg via INTRAVENOUS
  Filled 2013-12-23 (×5): qty 200

## 2013-12-23 NOTE — Progress Notes (Signed)
[place] for Infectious Disease  Date of Admission:  12/20/2013  Antibiotics: Antibiotics Given (last 72 hours)   Date/Time Action Medication Dose Rate   12/21/13 0153 Given   vancomycin (VANCOCIN) 500 mg in sodium chloride 0.9 % 100 mL IVPB 500 mg 100 mL/hr   12/21/13 1019 Given   vancomycin (VANCOCIN) IVPB 1000 mg/200 mL premix 1,000 mg 200 mL/hr   12/21/13 2235 Given   vancomycin (VANCOCIN) IVPB 1000 mg/200 mL premix 1,000 mg 200 mL/hr   12/22/13 2951 Given   vancomycin (VANCOCIN) IVPB 1000 mg/200 mL premix 1,000 mg 200 mL/hr   12/22/13 2243 Given   vancomycin (VANCOCIN) IVPB 1000 mg/200 mL premix 1,000 mg 200 mL/hr   12/23/13 1241 Given   vancomycin (VANCOCIN) IVPB 1000 mg/200 mL premix 1,000 mg 200 mL/hr      Subjective: He feels better on change of therapy  Objective: Temp:  [97.7 F (36.5 C)-98.5 F (36.9 C)] 97.7 F (36.5 C) (02/01 0540) Pulse Rate:  [73-81] 76 (02/01 0540) Resp:  [14-16] 15 (02/01 0540) BP: (120-126)/(60-76) 124/70 mmHg (02/01 0540) SpO2:  [96 %-99 %] 99 % (02/01 0540)  General: nad CV : rrr Lungs: cta b Abd: obese, nt, nd Ext: wrapped  Lab Results Lab Results  Component Value Date   WBC 8.4 12/23/2013   HGB 10.1* 12/23/2013   HCT 30.8* 12/23/2013   MCV 78.6 12/23/2013   PLT 600* 12/23/2013    Lab Results  Component Value Date   CREATININE 0.67 12/23/2013   BUN 6 12/23/2013   NA 140 12/23/2013   K 3.7 12/23/2013   CL 103 12/23/2013   CO2 25 12/23/2013    Lab Results  Component Value Date   ALT 15 12/21/2013   AST 17 12/21/2013   ALKPHOS 91 12/21/2013   BILITOT 0.2* 12/21/2013      Microbiology: Recent Results (from the past 240 hour(s))  CULTURE, BLOOD (ROUTINE X 2)     Status: None   Collection Time    12/20/13  7:40 PM      Result Value Range Status   Specimen Description BLOOD FOREARM LEFT   Final   Special Requests BOTTLES DRAWN AEROBIC AND ANAEROBIC 5CC   Final   Culture  Setup Time     Final   Value: 12/21/2013 00:49      Performed at [facility]   Culture     Final   Value:        BLOOD CULTURE RECEIVED NO GROWTH TO DATE CULTURE WILL BE HELD FOR 5 DAYS BEFORE ISSUING A FINAL NEGATIVE REPORT     Performed at [facility]   Report Status PENDING   Incomplete  CULTURE, BLOOD (ROUTINE X 2)     Status: None   Collection Time    12/20/13  8:35 PM      Result Value Range Status   Specimen Description BLOOD FOREARM LEFT   Final   Special Requests BOTTLES DRAWN AEROBIC AND ANAEROBIC 10CC   Final   Culture  Setup Time     Final   Value: 12/21/2013 04:06     Performed at [facility]   Culture     Final   Value:        BLOOD CULTURE RECEIVED NO GROWTH TO DATE CULTURE WILL BE HELD FOR 5 DAYS BEFORE ISSUING A FINAL NEGATIVE REPORT     Performed at [facility]   Report Status PENDING   Incomplete  MRSA PCR SCREENING     Status: None   Collection Time    12/20/13 10:43 PM      Result Value Range Status   MRSA by PCR NEGATIVE  NEGATIVE Final   Comment:            The GeneXpert MRSA Assay (FDA     approved for NASAL specimens     only), is one component of a     comprehensive MRSA colonization     surveillance program. It is not     intended to diagnose MRSA     infection nor to guide or     monitor treatment for     MRSA infections.    Studies/Results: No results found.  Assessment/Plan: 1) osteomyelitis - on vancomycin, WBC now wnl.  Was on daptomycin and "failed" but improving on vancomycin.  It is much less likely to have daptomycin resistance with vancomycin sensitivity but possible.  Other possibility is not getting daptomycin daily.  With vancomycin we will need to follow levels to be sure it is appropriate.  Will start the course over again for 6 weeks.  Baseline CRP 9.9, ESR 100 from this hospitalization.   Will need weekly cbc, cmp, vancomycin trough to [facility] We will arrange follow up in [facility] in about 2 weeks He will follow up with wound care and surgery at  [facility] Thanks to [facility] for help in management Please call with questions.   Scharlene Gloss, [place] for Infectious Disease [place] [email or web address] [phone] pager   [phone] cell 12/23/2013, 1:32 PM

## 2013-12-23 NOTE — Progress Notes (Signed)
ANTIBIOTIC CONSULT NOTE - FOLLOW UP  Pharmacy Consult for Vancomycin Indication: osteomyelitis  Allergies  Allergen Reactions  . Sulfa Antibiotics Hives  . Levofloxacin Rash  . Meropenem Rash  . Penicillins Rash  . Zyvox [Linezolid] Rash    Patient Measurements: Height: 5\' 10"  (177.8 cm) Weight: 251 lb 12.3 oz (114.2 kg) IBW/kg (Calculated) : 73  Vital Signs: Temp: 97.7 F (36.5 C) (02/01 0540) Temp src: Oral (02/01 0540) BP: 124/70 mmHg (02/01 0540) Pulse Rate: 76 (02/01 0540) Intake/Output from previous day: 01/31 0701 - 02/01 0700 In: [place] [P.O.:1040; I.V.:1650; IV Piggyback:400] Out: 2350 [Urine:2350] Intake/Output from this shift: Total I/O In: -  Out: 1000 [Urine:1000]  Labs:  Recent Labs  12/20/13 1940 12/21/13 0413 12/23/13 0500  WBC 14.1* 12.3* 8.4  HGB 12.0* 10.3* 10.1*  PLT 616* 573* 600*  CREATININE 0.66 0.64 0.67   Estimated Creatinine Clearance: 178.7 ml/min (by C-G formula based on Cr of 0.67).  Recent Labs  12/23/13 0835  [place] 8.2*     Microbiology: Recent Results (from the past 720 hour(s))  CULTURE, BLOOD (ROUTINE X 2)     Status: None   Collection Time    12/20/13  7:40 PM      Result Value Range Status   Specimen Description BLOOD FOREARM LEFT   Final   Special Requests BOTTLES DRAWN AEROBIC AND ANAEROBIC 5CC   Final   Culture  Setup Time     Final   Value: 12/21/2013 00:49     Performed at [facility]   Culture     Final   Value:        BLOOD CULTURE RECEIVED NO GROWTH TO DATE CULTURE WILL BE HELD FOR 5 DAYS BEFORE ISSUING A FINAL NEGATIVE REPORT     Performed at [facility]   Report Status PENDING   Incomplete  CULTURE, BLOOD (ROUTINE X 2)     Status: None   Collection Time    12/20/13  8:35 PM      Result Value Range Status   Specimen Description BLOOD FOREARM LEFT   Final   Special Requests BOTTLES DRAWN AEROBIC AND ANAEROBIC 10CC   Final   Culture  Setup Time     Final   Value: 12/21/2013  04:06     Performed at [facility]   Culture     Final   Value:        BLOOD CULTURE RECEIVED NO GROWTH TO DATE CULTURE WILL BE HELD FOR 5 DAYS BEFORE ISSUING A FINAL NEGATIVE REPORT     Performed at [facility]   Report Status PENDING   Incomplete  MRSA PCR SCREENING     Status: None   Collection Time    12/20/13 10:43 PM      Result Value Range Status   MRSA by PCR NEGATIVE  NEGATIVE Final   Comment:            The GeneXpert MRSA Assay (FDA     approved for NASAL specimens     only), is one component of a     comprehensive MRSA colonization     surveillance program. It is not     intended to diagnose MRSA     infection nor to guide or     monitor treatment for     MRSA infections.    Anti-infectives   Start     Dose/Rate Route Frequency Ordered Stop   12/21/13 1000  vancomycin (VANCOCIN) IVPB  1000 mg/200 mL premix     1,000 mg 200 mL/hr over 60 Minutes Intravenous Every 12 hours 12/20/13 2235     12/20/13 2245  vancomycin (VANCOCIN) 500 mg in sodium chloride 0.9 % 100 mL IVPB     500 mg 100 mL/hr over 60 Minutes Intravenous  Once 12/20/13 2234 12/21/13 0253   12/20/13 2015  vancomycin (VANCOCIN) IVPB 1000 mg/200 mL premix     1,000 mg 200 mL/hr over 60 Minutes Intravenous  Once 12/20/13 2007 12/20/13 2202      Assessment: 28 year old male who has received multiple antibiotic therapies for osteomyelitis.  He is currently on Vancomycin.  His trough today is subtherapeutic on a q12h regimen.  Goal of Therapy:  Vancomycin trough level 15-20 mcg/ml  Plan:  Change Vancomycin to 1gm IV q8h Check Vancomycin trough at steady state Follow renal function closely  Legrand Como, Pharm.D., BCPS, AAHIVP Clinical Pharmacist Phone: [phone] or [phone] 12/23/2013, 10:10 AM

## 2013-12-23 NOTE — Progress Notes (Signed)
PHYSICIAN PROGRESS NOTE  [name] WER:[id number] DOB: 06/29/1986 DOA: 12/20/2013  12/23/2013   PCP: Default, Provider, MD  Assessment/Plan: 1. Osteomyelitis of toe of left foot  The patient is presented with complaints of redness and swelling of his infected wound. He has been on daptomycin and has been treated with multiple antibiotics in the past. ED physician has discussed with on-call infectious disease who recommended to continue him on vancomycin IV fluids.  Pt's elevated WBC has returned to normal on vancomycin and patient feeling much better.  He will likely be discharged to continue home vancomycin. He has a PICC line.  Blood cultures no growth to date.  Pt undecided about seeing orthopedist here vs [facility].  He receives his wound care at [facility].    2. Paraplegia  Patient has chronic muscle spasm  Continue Flexeril and Valium   3. History of DVT  Doppler ultrasound negative for DVT  He had normal ABI 3-4 years ago   Consults: Infectious disease   DVT Prophylaxis: subcutaneous Heparin  Nutrition: Regular diet  Code Status: Full  Disposition: likely home soon   HPI/Subjective: Pt without complaints, room temperature much more comfortable now.   Objective: Filed Vitals:   12/23/13 0540  BP: 124/70  Pulse: 76  Temp: 97.7 F (36.5 C)  Resp: 15    Intake/Output Summary (Last 24 hours) at 12/23/13 1206 Last data filed at 12/23/13 1950  Gross per 24 hour  Intake   3090 ml  Output   3350 ml  Net   -260 ml   Filed Weights   12/20/13 2235  Weight: 251 lb 12.3 oz (114.2 kg)   Exam:  General: Alert, Awake and Oriented to Time, Place and Person. Appear in mild distress  Eyes: PERRL ENT: Oral Mucosa clear moist.  Neck: No JVD  Cardiovascular: S1 and S2 Present, no Murmur, Peripheral Pulses Present  Respiratory: BBS shallow but clear No Crackles, no wheezes  Abdomen: Bowel Sound Present, Soft and Non tender  Extremities: Bilateral Pedal edema, no calf tenderness,  wound: bandaged: C/D/INeurologic: Grossly Unremarkable.  Data Reviewed: Basic Metabolic Panel:  Recent Labs Lab 12/20/13 1940 12/21/13 0413 12/23/13 0500  NA 143 141 140  K 4.3 4.0 3.7  CL 102 103 103  CO2 25 26 25   GLUCOSE 94 93 104*  BUN 6 5* 6  CREATININE 0.66 0.64 0.67  CALCIUM 8.7 8.0* 8.2*   Liver Function Tests:  Recent Labs Lab 12/20/13 2246 12/21/13 0413  AST 18 17  ALT 13 15  ALKPHOS 101 91  BILITOT 0.2* 0.2*  PROT 8.8* 8.0  ALBUMIN 3.0* 2.8*   No results found for this basename: LIPASE, AMYLASE,  in the last 168 hours No results found for this basename: AMMONIA,  in the last 168 hours CBC:  Recent Labs Lab 12/20/13 1940 12/21/13 0413 12/23/13 0500  WBC 14.1* 12.3* 8.4  NEUTROABS 9.7*  --   --   HGB 12.0* 10.3* 10.1*  HCT 35.5* 31.0* 30.8*  MCV 76.7* 77.9* 78.6  PLT 616* 573* 600*   Cardiac Enzymes: No results found for this basename: CKTOTAL, CKMB, CKMBINDEX, TROPONINI,  in the last 168 hours BNP (last 3 results) No results found for this basename: PROBNP,  in the last 8760 hours CBG: No results found for this basename: GLUCAP,  in the last 168 hours  Recent Results (from the past 240 hour(s))  CULTURE, BLOOD (ROUTINE X 2)     Status: None   Collection Time  12/20/13  7:40 PM      Result Value Range Status   Specimen Description BLOOD FOREARM LEFT   Final   Special Requests BOTTLES DRAWN AEROBIC AND ANAEROBIC 5CC   Final   Culture  Setup Time     Final   Value: 12/21/2013 00:49     Performed at [facility]   Culture     Final   Value:        BLOOD CULTURE RECEIVED NO GROWTH TO DATE CULTURE WILL BE HELD FOR 5 DAYS BEFORE ISSUING A FINAL NEGATIVE REPORT     Performed at [facility]   Report Status PENDING   Incomplete  CULTURE, BLOOD (ROUTINE X 2)     Status: None   Collection Time    12/20/13  8:35 PM      Result Value Range Status   Specimen Description BLOOD FOREARM LEFT   Final   Special Requests BOTTLES DRAWN  AEROBIC AND ANAEROBIC 10CC   Final   Culture  Setup Time     Final   Value: 12/21/2013 04:06     Performed at [facility]   Culture     Final   Value:        BLOOD CULTURE RECEIVED NO GROWTH TO DATE CULTURE WILL BE HELD FOR 5 DAYS BEFORE ISSUING A FINAL NEGATIVE REPORT     Performed at [facility]   Report Status PENDING   Incomplete  MRSA PCR SCREENING     Status: None   Collection Time    12/20/13 10:43 PM      Result Value Range Status   MRSA by PCR NEGATIVE  NEGATIVE Final   Comment:            The GeneXpert MRSA Assay (FDA     approved for NASAL specimens     only), is one component of a     comprehensive MRSA colonization     surveillance program. It is not     intended to diagnose MRSA     infection nor to guide or     monitor treatment for     MRSA infections.     Studies: No results found.  Scheduled Meds: . enoxaparin (LOVENOX) injection  40 mg Subcutaneous Q24H  . imipramine  50 mg Oral QHS  . oxybutynin  30 mg Oral QHS  . sertraline  50 mg Oral BH-q7a  . vancomycin  1,000 mg Intravenous Q8H   Continuous Infusions: . sodium chloride 75 mL/hr at 12/23/13 0814   Principal Problem:   Osteomyelitis of toe of left foot Active Problems:   Decubitus ulcer of left leg   Paraplegia following spinal cord injury   History of DVT (deep vein thrombosis)   History of splenectomy  [facility] Pager [phone]. If 7PM-7AM, please contact night-coverage at [email or web address], password [facility] 12/23/2013, 12:06 PM  LOS: 3 days

## 2013-12-24 DIAGNOSIS — M869 Osteomyelitis, unspecified: Secondary | ICD-10-CM

## 2013-12-24 DIAGNOSIS — IMO0002 Reserved for concepts with insufficient information to code with codable children: Secondary | ICD-10-CM

## 2013-12-24 DIAGNOSIS — G8220 Paraplegia, unspecified: Secondary | ICD-10-CM

## 2013-12-24 MED ORDER — VANCOMYCIN HCL IN DEXTROSE 1-5 GM/200ML-% IV SOLN: 1000.0000 mg | Freq: Three times a day (TID) | INTRAVENOUS | Status: DC

## 2013-12-24 MED ORDER — HEPARIN SOD (PORK) LOCK FLUSH 100 UNIT/ML IV SOLN
250.0000 [IU] | INTRAVENOUS | Status: DC | PRN
  Administered 2013-12-24: 250 [IU]
  Filled 2013-12-24: qty 3

## 2013-12-24 MED ORDER — HEPARIN SOD (PORK) LOCK FLUSH 100 UNIT/ML IV SOLN
250.0000 [IU] | Freq: Every day | INTRAVENOUS | Status: DC
  Filled 2013-12-24: qty 3

## 2013-12-24 NOTE — Discharge Instructions (Signed)
[facility] to provide IV vancomycin 1 gm every 8 hours thru 01/31/14 for full 6 week course  Weekly CBC, cmp, vancomycin trough call results to [facility] (infectious disease clinic) Go to appointment with wound care center on Friday as scheduled Follow up as scheduled with infectious disease specialists in 2 weeks Return if symptoms recur, worsen or new problems develop.  Continue local wound care and daily dressing changes with home health RN  Bone and Joint Infections Joint infections are called septic or infectious arthritis. An infected joint may damage cartilage and tissue very quickly. This may destroy the joint. Bone infections (osteomyelitis) may last for years. Joints may become stiff if left untreated. Bacteria are the most common cause. Other causes include viruses and fungi, but these are more rare. Bone and joint infections usually come from injury or infection elsewhere in your body; the germs are carried to your bones or joints through the bloodstream.  CAUSES   Blood-carried germs from an infection elsewhere in your body can eventually spread to a bone or joint. The germ staphylococcus is the most common cause of both osteomyelitis and septic arthritis.  An injury can introduce germs into your bones or joints. SYMPTOMS   Weight loss.  Tiredness.  Chills and fever.  Bone or joint pain at rest and with activity.  Tenderness when touching the area or bending the joint.  Refusal to bear weight on a leg or inability to use an arm due to pain.  Decreased range of motion in a joint.  Skin redness, warmth, and tenderness.  Open skin sores and drainage. RISK FACTORS Children, the elderly, and those with weak immune systems are at increased risk of bone and joint infections. It is more common in people with HIV infections and with people on chemotherapy. People are also at increased risk if they have surgery where metal implants are used to stabilize the bone. Plates,  screws, or artificial joints provide a surface that bacteria can stick on. Such a growth of bacteria is called biofilm. The biofilm protects bacteria from antibiotics and bodily defenses. This allows germs to multiply. Other reasons for increased risks include:   Having previous surgery or injury of a bone or joint.  Being on high-dose corticosteroids and immunosuppressive medications that weaken your body's resistance to germs.  Diabetes and long-standing diseases.  Use of intravenous street drugs.  Being on hemodialysis.  Having a history of urinary tract infections.  Removal of your spleen (splenectomy). This weakens your immunity.  Chronic viral infections such as HIV or AIDS.  Lack of sensation such as paraplegia, quadriplegia, or spina bifida. DIAGNOSIS   Increased numbers of white blood cells in your blood may indicate infection. Some times your caregivers are able to identify the infecting germs by testing your blood. Inflammatory markers present in your bloodstream such as an erythrocyte sedimentation rate (ESR or sed rate) or c-reactive protein (CRP) can be indicators of deep infection.  Bone scans and X-ray exams are necessary for diagnosing osteomyelitis. They may help your caregiver find the infected areas. Other studies may give more detailed information. They may help detect fluid collections around a joint, abnormal bone surfaces, or be useful in diagnosing septic arthritis. They can find soft tissue swelling and find excess fluid in an infected joint or the adjacent bone. These tests include:  Ultrasound.  CT (computerized tomography).  MRI (magnetic resonance imaging).  The best test for diagnosing a bone or joint infection is an aspiration or biopsy.  Your caregiver will usually use a local anesthetic. He or she can then remove tissue from a bone injury or use a needle to take fluids from an infected joint. A local anesthetic medication numbs the area to be biopsied.  Often biopsies are done in the operating room under general anesthesia. This means you will be asleep during the procedure. Tests performed on these samples can identify an infection. TREATMENT   Treatment can help control long-standing infections, but infections may come back.  Infections can infect any bone or joint at any age.  Bone and joint infections are rarely fatal.  Bone infection left untreated can become a never-ending infection. It can spread to other areas of your body. It may eventually cause bone death. Reduced limb or joint function can result. In severe cases, this may require removal of a limb. Spinal osteomyelitis is very dangerous. Untreated, it may damage spinal nerves and cause death.  The most common complication of septic arthritis is osteoarthritis with pain and decreased range of motion of the joint. Some forms of treatment may include:  If the infection is caused by bacteria, it is generally treated with antibiotics. You will likely receive the drugs through a vein (intravenously) for anywhere from 2 to 6 weeks. In some cases, especially with children, oral antibiotics following an initial intravenous dose may be effective. The treatment you receive depends on the:  Type of bacteria.  Location of the infection.  Type of surgery that might be done.  Other health conditions or issues you might have.  Your caregiver may drain soft tissue abscesses or pockets of fluid around infected bones or joints. If you have septic arthritis, your caregiver may use a needle to drain pus from the joint on a daily basis. He or she may use an arthroscope to clean the joint or may need to open the joint surgically to remove damaged tissue and infection. An arthroscope is an instrument like a thin lighted telescope. It can be used to look inside the joint.  Surgery is usually needed if the infection has become long-standing. It may also be needed if there is hardware (such as metal  plates, screws, or artificial joints) inside the patient. Sometimes a bone or muscle graft is needed to fill in the open space. This promotes growth of new tissues and better blood flow to the area. PREVENTION   Clean and disinfect wounds quickly to help prevent the start of a bone or joint infection. Get treatment for any infections to prevent spread to a bone or joint.  Do not smoke. Smoking decreases healing rates of bone and predisposes to infection.  When given medications that suppress your immune system, use them according to your caregiver's instructions. Do not take more than prescribed for your condition.  Take good care of your feet and skin, especially if you have diabetes, decreased sensation or circulation problems. SEEK IMMEDIATE MEDICAL CARE IF:   You cannot bear weight on a leg or use an arm, especially following a minor injury. This can be a sign of bone or joint infection.  You think you may have signs or symptoms of a bone or joint infection. Your chance of getting rid of an infection is better if treated early. Document Released: 11/08/2005 Document Revised: 01/31/2012 Document Reviewed: 10/08/2009 [facility] Patient Information 2014 [place], [place].

## 2013-12-24 NOTE — Discharge Summary (Signed)
PHYSICIAN DISCHARGE SUMMARY   [name] ZOX:[id number] DOB: Jul 20, 1986 DOA: 12/20/2013  PCP: Default, Provider, MD  Admit date: 12/20/2013 Discharge date: 12/24/2013  Recommendations for Outpatient Follow-up:  [facility] to provide IV vancomycin 1 gm every 8 hours Weekly CBC, cmp, vancomycin trough call results to [facility] (infectious disease clinic) Go to appointment with wound care center on Friday as scheduled Follow up as scheduled with infectious disease specialists in 2 weeks Return if symptoms recur, worsen or new problems develop.  Continue local wound care and daily dressing changes with home health RN  Discharge Diagnoses:  Principal Problem:   Osteomyelitis of toe of left foot Active Problems:   Decubitus ulcer of left leg   Paraplegia following spinal cord injury   History of DVT (deep vein thrombosis)   History of splenectomy  Discharge Condition: stable  Diet recommendation: regular  Filed Weights   12/20/13 2235  Weight: 251 lb 12.3 oz (114.2 kg)    History of present illness:  [name] is a 28 y.o. 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  The patient is coming from home.  The patient presented with complaints of redness with swelling and discharge from his left lower extremity wound. He was admitted in October with osteomyelitis of his left foot and was placed on Ceftarolin and Flagyl and was discharged with a PICC line. In December he had increasing drainage from the wound and was sent to [facility] where after obtaining MRI they decided to change his antibiotic to daptomycin and Placed the wound wac. He was seen by infectious diseases early this month and his wound was healing well. Since last few week they have not been able to keep his wound VAC attached and it is nonfunctioning appropriately therefore one week  ago they moved his wound vac and since then he has noticed increasing discharge of her wound. Since last 3 days that has been increasing redness as well as swelling. He also felt low-grade fever without any chills.  Pt denies any headache, cough, chest pain, palpitation, shortness of breath, orthopnea, PND, nausea, vomiting, abdominal pain, diarrhea, constipation, active bleeding, burning urination, dizziness new focal neurological deficit.   Hospital Course:  1. Osteomyelitis of toe of left foot  The patient is presented with complaints of redness and swelling of his infected wound. He had been on daptomycin and has been treated with multiple antibiotics in the past. ED physician has discussed with on-call infectious disease who recommended to continue him on vancomycin IV fluids. Pt's elevated WBC has returned to normal on vancomycin and patient feeling much better. He will  be discharged to continue home vancomycin. This was arranged thru [facility].  He will need weekly trough levels, CMP, CBC levels to be reported to [facility] clinic.  He has a PICC line. Blood cultures no growth to date. Pt to follow up with [facility] in 2 weeks and [facility] on Friday. He receives his wound care at [facility].  Pt will be discharged to complete 6 full weeks of vancomycin starting 12/24/13-02/04/14.   2. Paraplegia  Patient has chronic muscle spasm  Continue Flexeril and Valium   3. History of DVT  Doppler ultrasound negative for DVT  He had normal ABI 3-4 years ago   DVT Prophylaxis: subcutaneous Heparin   Nutrition: Regular diet  Code Status: Full  Disposition:  Home  Procedures:  Wound care  Consultations:  ID  Wound care   Discharge Exam: pt without complaints.  Asking to go home.  Filed Vitals:   12/24/13 0605  BP: 122/63  Pulse: 73  Temp: 98.1 F (36.7 C)  Resp: 20   General: Alert, Awake and Oriented to Time, Place and Person. Appear in mild distress  Eyes: PERRL ENT: Oral Mucosa clear moist.    Neck: No JVD  Cardiovascular: S1 and S2 Present, no Murmur, Peripheral Pulses Present  Respiratory: BBS shallow but clear No Crackles, no wheezes  Abdomen: Bowel Sound Present, Soft and Non tender  Extremities: Bilateral Pedal edema, no calf tenderness, wound: bandaged: C/D/I Neurologic: Grossly Unremarkable.  Discharge Instructions  Discharge Orders   Future Appointments Provider Department Dept Phone   01/17/2014 11:15 AM Carlyle Basques, MD [facility] for Infectious Disease [phone]   Future Orders Complete By Expires   Discharge instructions  As directed    Comments:     [facility] to provide IV vancomycin 1 gm every 8 hours Weekly CBC, cmp, vancomycin trough call results to [facility] (infectious disease clinic) Go to appointment with wound care center on Friday as scheduled Follow up as scheduled with infectious disease specialists in 2 weeks Return if symptoms recur, worsen or new problems develop.  Continue local wound care and daily dressing changes with home health RN   Increase activity slowly  As directed        Medication List    STOP taking these medications       sodium chloride 0.9 % SOLN 100 mL with DAPTOmycin 500 MG SOLR 917 mg      TAKE these medications       baclofen 10 MG tablet  Commonly known as:  LIORESAL  Take 30 mg by mouth 3 (three) times daily as needed (spasms).     diazepam 10 MG tablet  Commonly known as:  VALIUM  Take 1 tablet (10 mg total) by mouth at bedtime.     imipramine 50 MG tablet  Commonly known as:  TOFRANIL  Take 50 mg by mouth at bedtime.     multivitamin tablet  Take 1 tablet by mouth daily.     oxybutynin 15 MG 24 hr tablet  Commonly known as:  DITROPAN XL  Take 30 mg by mouth at bedtime.     sertraline 50 MG tablet  Commonly known as:  ZOLOFT  Take 50 mg by mouth every morning.     vancomycin 1 GM/200ML Soln  Commonly known as:  VANCOCIN  Inject 200 mLs (1,000 mg total) into the vein every  8 (eight) hours.  Start taking on:  12/24/13-01/31/14 for full 6 week course       Allergies  Allergen Reactions  . Sulfa Antibiotics Hives  . Levofloxacin Rash  . Meropenem Rash  . Penicillins Rash  . Zyvox [Linezolid] Rash       Follow-up Information   Follow up with [facility]. Schedule an appointment as soon as possible for a visit in 2 weeks. (Follow up infection )    Contact information:   [address] [place] [address]       Follow up with [facility] . Schedule an appointment as soon as possible for a visit in 4 days. (wound care )       The results of significant diagnostics from this hospitalization (including imaging, microbiology, ancillary  and laboratory) are listed below for reference.    Significant Diagnostic Studies: Dg Tibia/fibula Left  12/20/2013   CLINICAL DATA:  Ulcerations, paraplegia, soft tissue swelling  EXAM: LEFT TIBIA AND FIBULA - 2 VIEW  COMPARISON:  12/20/2013, 10/03/2011  FINDINGS: Normal alignment. Bones are osteopenic. Left tibia and fibula appear intact. No fracture evident. Diffuse lower extremity soft tissue swelling noted. No definite osseous destruction or bone loss. Healed deformity of the distal tibia and fibula from remote fractures.  IMPRESSION: Chronic changes as described.  No acute osseous finding.   Electronically Signed   By: Daryll Brod M.D.   On: 12/20/2013 18:23   Dg Foot Complete Left  12/20/2013   CLINICAL DATA:  Ulceration, paraplegia, wound infection  EXAM: LEFT FOOT - COMPLETE 3+ VIEW  COMPARISON:  09/12/2013  FINDINGS: Previous amputation of the left second toe. Bones are osteopenic. Diffuse dorsal soft tissue swelling on the lateral view. No gross malalignment or definite fracture.  IMPRESSION: Interval left second toe amputation.  Osteopenia  No other acute finding by plain radiography   Electronically Signed   By: Daryll Brod M.D.   On: 12/20/2013 18:21   Microbiology: Recent  Results (from the past 240 hour(s))  CULTURE, BLOOD (ROUTINE X 2)     Status: None   Collection Time    12/20/13  7:40 PM      Result Value Range Status   Specimen Description BLOOD FOREARM LEFT   Final   Special Requests BOTTLES DRAWN AEROBIC AND ANAEROBIC 5CC   Final   Culture  Setup Time     Final   Value: 12/21/2013 00:49     Performed at [facility]   Culture     Final   Value:        BLOOD CULTURE RECEIVED NO GROWTH TO DATE CULTURE WILL BE HELD FOR 5 DAYS BEFORE ISSUING A FINAL NEGATIVE REPORT     Performed at [facility]   Report Status PENDING   Incomplete  CULTURE, BLOOD (ROUTINE X 2)     Status: None   Collection Time    12/20/13  8:35 PM      Result Value Range Status   Specimen Description BLOOD FOREARM LEFT   Final   Special Requests BOTTLES DRAWN AEROBIC AND ANAEROBIC 10CC   Final   Culture  Setup Time     Final   Value: 12/21/2013 04:06     Performed at [facility]   Culture     Final   Value:        BLOOD CULTURE RECEIVED NO GROWTH TO DATE CULTURE WILL BE HELD FOR 5 DAYS BEFORE ISSUING A FINAL NEGATIVE REPORT     Performed at [facility]   Report Status PENDING   Incomplete  MRSA PCR SCREENING     Status: None   Collection Time    12/20/13 10:43 PM      Result Value Range Status   MRSA by PCR NEGATIVE  NEGATIVE Final   Comment:            The GeneXpert MRSA Assay (FDA     approved for NASAL specimens     only), is one component of a     comprehensive MRSA colonization     surveillance program. It is not     intended to diagnose MRSA     infection nor to guide or     monitor treatment for     MRSA  infections.    Labs: Basic Metabolic Panel:  Recent Labs Lab 12/20/13 1940 12/21/13 0413 12/23/13 0500  NA 143 141 140  K 4.3 4.0 3.7  CL 102 103 103  CO2 25 26 25   GLUCOSE 94 93 104*  BUN 6 5* 6  CREATININE 0.66 0.64 0.67  CALCIUM 8.7 8.0* 8.2*   Liver Function Tests:  Recent Labs Lab 12/20/13 2246  12/21/13 0413  AST 18 17  ALT 13 15  ALKPHOS 101 91  BILITOT 0.2* 0.2*  PROT 8.8* 8.0  ALBUMIN 3.0* 2.8*   No results found for this basename: LIPASE, AMYLASE,  in the last 168 hours No results found for this basename: AMMONIA,  in the last 168 hours CBC:  Recent Labs Lab 12/20/13 1940 12/21/13 0413 12/23/13 0500  WBC 14.1* 12.3* 8.4  NEUTROABS 9.7*  --   --   HGB 12.0* 10.3* 10.1*  HCT 35.5* 31.0* 30.8*  MCV 76.7* 77.9* 78.6  PLT 616* 573* 600*   Cardiac Enzymes: No results found for this basename: CKTOTAL, CKMB, CKMBINDEX, TROPONINI,  in the last 168 hours BNP: BNP (last 3 results) No results found for this basename: PROBNP,  in the last 8760 hours CBG: No results found for this basename: GLUCAP,  in the last 168 hours  40 mins spent preparing discharge, reviewing records, reconciling medications, arranging for followup, home health, etc.   Signed:  Jarren Para  [facility] 12/24/2013, 2:11 PM

## 2013-12-24 NOTE — Progress Notes (Signed)
Discharge instructions reviewed with pt.  Pt verbalized understanding and had no questions.  Pt discharged in stable condition via wheelchair.  [name]   

## 2013-12-24 NOTE — Care Management Note (Signed)
  Page 1 of 1   12/24/2013     2:01:38 PM   CARE MANAGEMENT NOTE 12/24/2013  Patient:  [name], [name]   Account Number:  [id number]  Date Initiated:  12/24/2013  Documentation initiated by:  Magdalen Spatz  Subjective/Objective Assessment:     Action/Plan:   Anticipated DC Date:  12/24/2013   Anticipated DC Plan:  [place]         Choice offered to / List presented to:  C-1 Patient        [place] arranged  HH-1 RN      [place].   Status of service:  Completed, signed off Medicare Important Message given?   (If response is "NO", the following Medicare IM given date fields will be blank) Date Medicare IM given:   Date Additional Medicare IM given:    Discharge Disposition:  [place]  Per UR Regulation:    If discussed at Long Length of Stay Meetings, dates discussed:    Comments:

## 2013-12-27 ENCOUNTER — Ambulatory Visit: Payer: PRIVATE HEALTH INSURANCE | Admitting: Internal Medicine

## 2013-12-27 LAB — CULTURE, BLOOD (ROUTINE X 2)
Culture: NO GROWTH
Culture: NO GROWTH

## 2014-01-07 ENCOUNTER — Encounter: Payer: Self-pay | Admitting: Internal Medicine

## 2014-01-12 ENCOUNTER — Emergency Department (HOSPITAL_COMMUNITY)
Admission: EM | Admit: 2014-01-12 | Discharge: 2014-01-13 | Disposition: A | Discharge status: Home / Self Care | Payer: PRIVATE HEALTH INSURANCE | Attending: Emergency Medicine | Admitting: Emergency Medicine

## 2014-01-12 ENCOUNTER — Encounter (HOSPITAL_COMMUNITY): Payer: Self-pay | Admitting: Emergency Medicine

## 2014-01-12 DIAGNOSIS — G8220 Paraplegia, unspecified: Secondary | ICD-10-CM | POA: Insufficient documentation

## 2014-01-12 DIAGNOSIS — Z452 Encounter for adjustment and management of vascular access device: Secondary | ICD-10-CM | POA: Insufficient documentation

## 2014-01-12 DIAGNOSIS — M869 Osteomyelitis, unspecified: Secondary | ICD-10-CM | POA: Insufficient documentation

## 2014-01-12 DIAGNOSIS — Z87891 Personal history of nicotine dependence: Secondary | ICD-10-CM | POA: Insufficient documentation

## 2014-01-12 DIAGNOSIS — Z792 Long term (current) use of antibiotics: Secondary | ICD-10-CM | POA: Insufficient documentation

## 2014-01-12 DIAGNOSIS — N3941 Urge incontinence: Secondary | ICD-10-CM | POA: Insufficient documentation

## 2014-01-12 DIAGNOSIS — Z86718 Personal history of other venous thrombosis and embolism: Secondary | ICD-10-CM | POA: Insufficient documentation

## 2014-01-12 DIAGNOSIS — Z79899 Other long term (current) drug therapy: Secondary | ICD-10-CM | POA: Insufficient documentation

## 2014-01-12 DIAGNOSIS — Z789 Other specified health status: Secondary | ICD-10-CM | POA: Insufficient documentation

## 2014-01-12 DIAGNOSIS — Z993 Dependence on wheelchair: Secondary | ICD-10-CM | POA: Insufficient documentation

## 2014-01-12 DIAGNOSIS — Z9981 Dependence on supplemental oxygen: Secondary | ICD-10-CM | POA: Insufficient documentation

## 2014-01-12 DIAGNOSIS — Z880 Allergy status to penicillin: Secondary | ICD-10-CM | POA: Insufficient documentation

## 2014-01-12 DIAGNOSIS — Z87828 Personal history of other (healed) physical injury and trauma: Secondary | ICD-10-CM | POA: Insufficient documentation

## 2014-01-12 DIAGNOSIS — Y838 Other surgical procedures as the cause of abnormal reaction of the patient, or of later complication, without mention of misadventure at the time of the procedure: Secondary | ICD-10-CM | POA: Insufficient documentation

## 2014-01-12 DIAGNOSIS — Z8719 Personal history of other diseases of the digestive system: Secondary | ICD-10-CM | POA: Insufficient documentation

## 2014-01-12 DIAGNOSIS — G4733 Obstructive sleep apnea (adult) (pediatric): Secondary | ICD-10-CM | POA: Insufficient documentation

## 2014-01-12 MED ORDER — VANCOMYCIN HCL 10 G IV SOLR
2225.0000 mg | Freq: Once | INTRAVENOUS | Status: AC
  Administered 2014-01-13: 2225 mg via INTRAVENOUS
  Filled 2014-01-12: qty 2225

## 2014-01-12 NOTE — ED Notes (Signed)
Pt in stating he received antibiotics at home via a PICC line and last night he noted his PICC line was leaking, denies redness or swelling at this the sight

## 2014-01-12 NOTE — ED Notes (Signed)
IV team here to assess PICC line.

## 2014-01-12 NOTE — ED Notes (Addendum)
Pt admitted with single lumen PICC line right brachial placed on 09/19/13 for long term ABT related to foot infection. PICC leaking from  the main purple line at distal end of the line, MD notified stated to call IV team. IV team paged waiting for call back.  Pt is paraplegic from [place] 2005. Stated have a home health Nurse that comes three time a week.

## 2014-01-12 NOTE — Discharge Instructions (Signed)
Your PICC line was removed. You were given a dose of your antibiotic this evening. Please followup with your doctors and home health nurse to continue your treatments.    Peripherally Inserted Central Catheter (PICC) Removal and Care After A peripherally inserted catheter (PICC) is removed when it is no longer needed, when it is clotted, or when it may be infected.  PROCEDURE  The removal of a PICC is usually painless. Removing the tape that holds the PICC in place may be the most discomfort you have.  A physicians order needs to be obtained to have the PICC removed.  A PICC can be removed in the hospital or in an outpatient setting.  Never remove or take out the PICC yourself. Only a trained clinical professional, such as a PICC nurse, should remove the PICC.  If a PICC is suspected to be infected, the PICC tip is sent to the lab for culture. HOME CARE INSTRUCTIONS  When the PICC is out, pressure is applied at the insertion site to prevent bleeding. An antibiotic ointment may be applied to the insertion site. A dry, sterile gauze is then taped over the insertion site. This dressing should stay on for 24 hours.  After the 24 hours is up, the dressing may be removed. The PICC insertion site is very small. A small scab may develop over the insertion site. It is okay to wash the site gently with soap and water. Be careful to not remove or pick the scab off. After washing, gently pat the site dry. You do not need to put another dressing over the insertion site after you wash it.  Avoid heavy, strenuous physical activity for 24 hours after the PICC is removed. This includes things like:  Weight lifting.  Strenuous yard work.  Any physical activity with repetitive arm movement. SEEK MEDICAL CARE IF:  Call or see your caregiver as soon as possible if you develop the following conditions in the arm in which the PICC was inserted:  Swelling or puffiness.  Increasing tenderness or  pain. SEEK IMMEDIATE MEDICAL CARE IF:  You develop any of the following conditions in the arm that had the PICC:  Numbness or tingling in your fingers, hand, or arm.  You arm has a bluish color and it is cold to the touch.  Redness around the insertion site or a red-streak that goes up your arm.  Any type of drainage from the PICC insertion site. This includes drainage such as:  Bleeding from the insertion site. (If this happens, apply firm, direct pressure to the PICC insertion site with a clean towel.)  Drainage that is yellow or tan in color.  You have an oral temperature above 102 F (38.9 C), not controlled by medicine. Document Released: 04/28/2010 Document Revised: 01/31/2012 Document Reviewed: 04/28/2010 [facility] Patient Information 2014 [place].

## 2014-01-12 NOTE — ED Notes (Signed)
IV team at bedside 
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
## 2014-01-13 NOTE — ED Provider Notes (Signed)
Medical screening examination/treatment/procedure(s) were performed by non-physician practitioner and as supervising physician I was immediately available for consultation/collaboration.  EKG Interpretation   None        Orlie Dakin, MD 01/13/14 [phone]

## 2014-01-14 ENCOUNTER — Telehealth: Payer: Self-pay | Admitting: *Deleted

## 2014-01-14 NOTE — Telephone Encounter (Signed)
Pt with question about whether PICC should be replaced.  MD please advise.

## 2014-01-15 NOTE — Telephone Encounter (Signed)
Per conversation with Dr Baxter Flattery called the patient to advise him that they will discuss his antibiotic treatment at his visit on Thursday 01/17/14. He was fine with that answer and advised he will be here.

## 2014-01-16 ENCOUNTER — Telehealth: Payer: Self-pay | Admitting: *Deleted

## 2014-01-16 NOTE — Telephone Encounter (Signed)
Amy with [facility] called wanting to know if patient's picc line is to be replaced or if he is being changed to oral antibiotics. This was to be discussed at tomorrows appt. If his appt needs to be rescheduled Amy does have Dr. Storm Frisk pager #. [phone] Hopping

## 2014-01-17 ENCOUNTER — Ambulatory Visit: Payer: PRIVATE HEALTH INSURANCE | Admitting: Internal Medicine

## 2014-01-18 ENCOUNTER — Telehealth: Payer: Self-pay

## 2014-01-18 NOTE — Telephone Encounter (Signed)
Patient is calling per request of [facility].  He wonders if he should be on oral antibiotics since he PICC has been pulled for more than one month. He has appointment scheduled for next Monday. I advised he could consult Dr Baxter Flattery at this visit but he has requested I send a phone note.   Laverle Patter, RN

## 2014-01-21 ENCOUNTER — Encounter: Payer: Self-pay | Admitting: Internal Medicine

## 2014-01-21 ENCOUNTER — Ambulatory Visit (INDEPENDENT_AMBULATORY_CARE_PROVIDER_SITE_OTHER): Payer: PRIVATE HEALTH INSURANCE | Admitting: Internal Medicine

## 2014-01-21 VITALS — BP 146/87 | HR 48 | Temp 98.0°F

## 2014-01-21 DIAGNOSIS — R350 Frequency of micturition: Secondary | ICD-10-CM

## 2014-01-21 DIAGNOSIS — M869 Osteomyelitis, unspecified: Secondary | ICD-10-CM

## 2014-01-21 LAB — BASIC METABOLIC PANEL
BUN: 10 mg/dL (ref 6–23)
CO2: 28 mEq/L (ref 19–32)
Calcium: 9 mg/dL (ref 8.4–10.5)
Chloride: 98 mEq/L (ref 96–112)
Creat: 0.64 mg/dL (ref 0.50–1.35)
Glucose, Bld: 66 mg/dL — ABNORMAL LOW (ref 70–99)
Potassium: 4.5 mEq/L (ref 3.5–5.3)
Sodium: 136 mEq/L (ref 135–145)

## 2014-01-21 LAB — SEDIMENTATION RATE: Sed Rate: 65 mm/hr — ABNORMAL HIGH (ref 0–16)

## 2014-01-21 LAB — C-REACTIVE PROTEIN: CRP: 5.7 mg/dL — ABNORMAL HIGH (ref <0.60)

## 2014-01-21 MED ORDER — DOXYCYCLINE HYCLATE 100 MG PO TABS: 100.0000 mg | ORAL_TABLET | Freq: Two times a day (BID) | ORAL | Status: DC

## 2014-01-21 MED ORDER — CIPROFLOXACIN HCL 750 MG PO TABS: 750.0000 mg | ORAL_TABLET | Freq: Two times a day (BID) | ORAL | Status: DC

## 2014-01-21 NOTE — Progress Notes (Signed)
Cc: follow up on MRSA chronic leg ulcer/osteomyelitis Subjective:    Patient ID: [name], male    DOB: Nov 10, 1986, 28 y.o.   MRN: [id number]  HPI 28yo M with lower extremity paralysis and chronic left leg ulcer. He was re-hospitalized at the end of January and discharged on 6 wks of IV vancomycin, thinking that dapto was not working for him. Previously being to daptomycin in December, he was on ceftaroline for 6-8wks.  He had his picc line removed on 2/21 due to leak, and now back in clinic for evaluation. He has been attempting to do daily dressing changes and changes with dakin solution to keep wound bed clean. Previously had been on antibiotics most of hte year in 2014. He had bone bx that identified MRSA (R clinda, S tetra, S bactrim) in December where he was placed on daptomycin off of ceftaroline at that time. Vancomycin was avoided since he had episodes of AKI requiring hospitalization.  Today: lesion between toes is 4 x 4.5 cm wide on dorsum of foot. Lateral lesion measurs 5.5cm at widest part and 15 cm long and 0.5cm at deepest area with what appears to be tendon exposure.  He sees [facility] in 4 days along with seeing orthopedics +/- plastics  Current Outpatient Prescriptions on File Prior to Visit  Medication Sig Dispense Refill  . baclofen (LIORESAL) 20 MG tablet Take 40-60 mg by mouth 3 (three) times daily. 60mg  in the morning, 40mg  in the afternoon, and 60mg  in the evening      . diazepam (VALIUM) 10 MG tablet Take 1 tablet (10 mg total) by mouth at bedtime.  30 tablet    . imipramine (TOFRANIL) 50 MG tablet Take 50 mg by mouth at bedtime.       Marland Kitchen loratadine (CLARITIN) 10 MG tablet Take 10 mg by mouth daily as needed for allergies.      . Multiple Vitamin (MULTIVITAMIN) tablet Take 1 tablet by mouth daily.      Marland Kitchen oxybutynin (DITROPAN XL) 15 MG 24 hr tablet Take 30 mg by mouth at bedtime.       . sertraline (ZOLOFT) 50 MG tablet Take 50 mg by mouth every morning.        No current facility-administered medications on file prior to visit.   Active Ambulatory Problems    Diagnosis Date Noted  . Decubitus ulcer of left leg 04/27/2012  . 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 History of DVT (deep vein thrombosis) 05/01/2012  . 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 Leg fracture, right 05/01/2012  . Lower paraplegia   . Spinal cord injury of T10 vertebra   . History of DVT of lower extremity   . Leukocytosis 06/26/2012  . Infected decubitus ulcer 06/26/2012  . Allergy to meropenem 02/27/2013  . Lower extremity cellulitis 07/16/2013  . Sepsis 07/16/2013  . Acute renal failure 07/16/2013  . Anemia 09/11/2013  . Thrombocytosis 09/11/2013  . Osteomyelitis of toe of left foot 10/03/2013  . Cellulitis 12/20/2013  . Osteomyelitis 12/20/2013   Resolved Ambulatory Problems    Diagnosis Date Noted  . No Resolved Ambulatory Problems   Past Medical History  Diagnosis Date  . Seasonal allergies   . Mild acid reflux WATCHES DIET  . Urge incontinence   . Spastic neurogenic bladder   . Self-catheterizes urinary bladder 4 TO 6 TIMES DAILY AND PRN  . Leg ulcer, left   . OSA on CPAP MODERATE  . Wheelchair bound  Review of Systems He is more incontinent lately and has cloudy urine concerning for ua and urine culture    Objective:   Physical Exam BP 146/87  Pulse 48  Temp(Src) 98 F (36.7 C) (Oral) Physical Exam  Constitutional: He is oriented to person, place, and time. He appears well-developed and well-nourished. No distress.  HENT:  Mouth/Throat: Oropharynx is clear and moist. No oropharyngeal exudate.  Lymphadenopathy:  He has no cervical adenopathy.  Neurological: flaccid paralysis of lower extremities, with occ clonus Skin:  lesion between toes is 4 x 4.5 cm wide on dorsum of foot. Lateral lesion measurs 5.5cm at widest part and 15 cm long and 0.5cm at deepest area with what appears to be tendon exposure.        Assessment & Plan:  Very complicated case of nonhealing ulcer/osteomyelitis of left lower extremity. He is being followed by plastics/wound care at [facility] as well as ID. He has been on several IV extended courses of antibiotics this past year with little improvement in his chronic leg ulcer. He has failed skin graft this past year.  Chronic ulcer = no recent wound cx but has had mrsa + deep wound/bone bx cx. He has finished roughly 3-4 wks of IV vanco prior to picc line failing. We will transition him to oral doxycycline and cipro x 4 wk for empiric coverage. Will defer to plastics at [facility] if they want to restart IV antibiotics. We will check cbc with, bmp, sed rate and crp. I am wondering if amputation should be broached with the patient. These ulcers have existed for several years. I fear that the constant antibiotic exposure is leading to risks for cdiff as well as multidrugresistant organisms like CRE  Frequent urination concerning for uti = will get ua and urine cx. Patient is colonized with AB. Will follow up to provide antibiotics tx  rtc 2-4 wk

## 2014-01-22 LAB — URINALYSIS, MICROSCOPIC ONLY
Bacteria, UA: NONE SEEN
Casts: NONE SEEN
Crystals: NONE SEEN
WBC, UA: >50 WBC/hpf — AB (ref <3)

## 2014-01-22 LAB — URINALYSIS, ROUTINE W REFLEX MICROSCOPIC
Bilirubin Urine: NEGATIVE
Glucose, UA: NEGATIVE mg/dL
Ketones, ur: NEGATIVE mg/dL
Nitrite: POSITIVE — AB
Protein, ur: >300 mg/dL — AB
Specific Gravity, Urine: 1.023 (ref 1.005–1.030)
Urobilinogen, UA: 0.2 mg/dL (ref 0.0–1.0)
pH: 6.5 (ref 5.0–8.0)

## 2014-01-23 ENCOUNTER — Telehealth: Payer: Self-pay | Admitting: *Deleted

## 2014-01-23 LAB — URINE CULTURE: Colony Count: >=100000

## 2014-01-23 NOTE — Telephone Encounter (Signed)
Called the patient to follow up on his ur cx results. He grew out friendly Kleb pneumo. S to cipro for which we had just started. The patient states his urinary symptoms are resolved. He did mention that he saw Dr. Nicki Reaper at [facility] for evaluation of wounds. Ortho surgeon felt that wounds would be difficult to treat and recommended amputation. The patient states that he would only consider that as last resort. He is returning to wound care this Friday and also getting appt with PT to refit for wheelchair to minimize chances of abrasion.

## 2014-01-23 NOTE — Telephone Encounter (Signed)
Message copied by Reggy Eye on Wed Jan 23, 2014 12:09 PM ------      Message from: Carlyle Basques      Created: Mon Jan 21, 2014  4:41 PM       Darnelle Maffucci, can you find out who we can send my clinic note to at [facility]. He has appt on friday ------

## 2014-01-23 NOTE — Telephone Encounter (Signed)
Called Wound Care at [facility] to get the fax number and was given [phone]. Faxed the patient recent office visit and lab work as he has an appt 01/25/14 per Dr Baxter Flattery request. Also called the patient and reminded him how important it is that he keep this appt. He advised he understands and will go.

## 2014-02-11 ENCOUNTER — Encounter: Payer: Self-pay | Admitting: Internal Medicine

## 2014-02-11 ENCOUNTER — Ambulatory Visit (INDEPENDENT_AMBULATORY_CARE_PROVIDER_SITE_OTHER): Payer: PRIVATE HEALTH INSURANCE | Admitting: Internal Medicine

## 2014-02-11 VITALS — BP 164/99 | HR 99 | Temp 98.4°F

## 2014-02-11 DIAGNOSIS — M869 Osteomyelitis, unspecified: Secondary | ICD-10-CM

## 2014-02-11 DIAGNOSIS — L97909 Non-pressure chronic ulcer of unspecified part of unspecified lower leg with unspecified severity: Secondary | ICD-10-CM

## 2014-02-11 LAB — CBC WITH DIFFERENTIAL/PLATELET
Basophils Absolute: 0.1 10*3/uL (ref 0.0–0.1)
Basophils Relative: 1 % (ref 0–1)
Eosinophils Absolute: 0.3 10*3/uL (ref 0.0–0.7)
Eosinophils Relative: 3 % (ref 0–5)
HCT: 36.1 % — ABNORMAL LOW (ref 39.0–52.0)
Hemoglobin: 11.8 g/dL — ABNORMAL LOW (ref 13.0–17.0)
Lymphocytes Relative: 31 % (ref 12–46)
Lymphs Abs: 2.9 10*3/uL (ref 0.7–4.0)
MCH: 23.1 pg — ABNORMAL LOW (ref 26.0–34.0)
MCHC: 32.7 g/dL (ref 30.0–36.0)
MCV: 70.6 fL — ABNORMAL LOW (ref 78.0–100.0)
Monocytes Absolute: 0.9 10*3/uL (ref 0.1–1.0)
Monocytes Relative: 10 % (ref 3–12)
Neutro Abs: 5.1 10*3/uL (ref 1.7–7.7)
Neutrophils Relative %: 55 % (ref 43–77)
Platelets: 473 10*3/uL — ABNORMAL HIGH (ref 150–400)
RBC: 5.11 MIL/uL (ref 4.22–5.81)
RDW: 16.9 % — ABNORMAL HIGH (ref 11.5–15.5)
WBC: 9.2 10*3/uL (ref 4.0–10.5)

## 2014-02-11 LAB — BASIC METABOLIC PANEL WITH GFR
BUN: 8 mg/dL (ref 6–23)
CO2: 26 mEq/L (ref 19–32)
Calcium: 9.3 mg/dL (ref 8.4–10.5)
Chloride: 100 mEq/L (ref 96–112)
Creat: 0.65 mg/dL (ref 0.50–1.35)
GFR, Est African American: >89 mL/min
GFR, Est Non African American: >89 mL/min
Glucose, Bld: 89 mg/dL (ref 70–99)
Potassium: 4.3 mEq/L (ref 3.5–5.3)
Sodium: 139 mEq/L (ref 135–145)

## 2014-02-11 LAB — SEDIMENTATION RATE: Sed Rate: 34 mm/hr — ABNORMAL HIGH (ref 0–16)

## 2014-02-11 NOTE — Progress Notes (Signed)
Subjective:    Patient ID: [name], male    DOB: [birth year], 28 y.o.   MRN: [id number]  HPI 28yo M with lower extremity paralysis and chronic left leg ulcer. He was re-hospitalized at the end of January and discharged on 6 wks of IV vancomycin, thinking that dapto was not working for him. Previously being to daptomycin in December, he was on ceftaroline for 6-8wks. He had his picc line removed on 2/21 due to leak, and now back in clinic for evaluation. He has been attempting to do daily dressing changes and changes with dakin solution to keep wound bed clean. Previously had been on antibiotics most of hte year in 2014. He had bone bx that identified MRSA (R clinda, S tetra, S bactrim) in December where he was placed on daptomycin off of ceftaroline at that time. Vancomycin was avoided since he had episodes of AKI requiring hospitalization. He was last seen in clinic on 01/21/14 where we placed him on doxycycline and cipro. Today, his dressing has not been changed in 3 days per his report since his girlfriend went out of town. The lateral lesions is wider by 1 cm than 3 wks ago. He reports he hasn't been to wound care recently in the last 2-3 wks. They were to refer him for mist therapy. When he saw orthopedics,just after our last appointment, they had recommended amputation.  Today: Left leg lateral lesion measures 6.6 cm x 17.5c mlong x 5.5 cm at heal. 0.5cm deepest area with tendon exposure  Left 1st toe/dorsum of foot 4.5cm by 4 cm.   Current Outpatient Prescriptions on File Prior to Visit  Medication Sig Dispense Refill  . baclofen (LIORESAL) 20 MG tablet Take 40-60 mg by mouth 3 (three) times daily. 60mg  in the morning, 40mg  in the afternoon, and 60mg  in the evening      . ciprofloxacin (CIPRO) 750 MG tablet Take 1 tablet (750 mg total) by mouth 2 (two) times daily.  60 tablet  0  . diazepam (VALIUM) 10 MG tablet Take 1 tablet (10 mg total) by mouth at bedtime.  30 tablet    . doxycycline  (VIBRA-TABS) 100 MG tablet Take 1 tablet (100 mg total) by mouth 2 (two) times daily.  60 tablet  0  . imipramine (TOFRANIL) 50 MG tablet Take 50 mg by mouth at bedtime.       Marland Kitchen loratadine (CLARITIN) 10 MG tablet Take 10 mg by mouth daily as needed for allergies.      . Multiple Vitamin (MULTIVITAMIN) tablet Take 1 tablet by mouth daily.      Marland Kitchen oxybutynin (DITROPAN XL) 15 MG 24 hr tablet Take 30 mg by mouth at bedtime.       . sertraline (ZOLOFT) 50 MG tablet Take 50 mg by mouth every morning.       No current facility-administered medications on file prior to visit.   Active Ambulatory Problems    Diagnosis Date Noted  . Decubitus ulcer of left leg 04/27/2012  . 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 History of DVT (deep vein thrombosis) 05/01/2012  . 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 Leg fracture, right 05/01/2012  . Lower paraplegia   . Spinal cord injury of T10 vertebra   . History of DVT of lower extremity   . Leukocytosis 06/26/2012  . Infected decubitus ulcer 06/26/2012  . Allergy to meropenem 02/27/2013  . Lower extremity cellulitis 07/16/2013  . Sepsis 07/16/2013  . Acute renal failure  07/16/2013  . Anemia 09/11/2013  . Thrombocytosis 09/11/2013  . Osteomyelitis of toe of left foot 10/03/2013  . Cellulitis 12/20/2013  . Osteomyelitis 12/20/2013   Resolved Ambulatory Problems    Diagnosis Date Noted  . No Resolved Ambulatory Problems   Past Medical History  Diagnosis Date  . Seasonal allergies   . Mild acid reflux WATCHES DIET  . Urge incontinence   . Spastic neurogenic bladder   . Self-catheterizes urinary bladder 4 TO 6 TIMES DAILY AND PRN  . Leg ulcer, left   . OSA on CPAP MODERATE  . Wheelchair bound      Review of Systems Dark urine, less urine production. No foul smelling, no cloudy urine, no fever, chills, nightsweats. Still has ongoing spasm of leg    Objective:   Physical Exam BP 164/99  Pulse 99  Temp(Src) 98.4 F (36.9 C)  (Oral) gen = a xo by 3 in nad heent = no thrush, no scleral icterus Skin = Left leg 6.6 cm x 17.5c mlong x 5.5 cm at heal. 0.5cm deepest area with tendon exposure  Left 1st toe/dorsum of foot 4.5cm by 4 cm. 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 Based on labs, will decide if need to continue his current antibiotics. Reinforced that he needs to follow up with wound care. Spent 30 min in direct patient care doing debridement and changing dressing    Decrease urine output= will check bmp. Pt wants to defer ua.  rtc in 4 wk

## 2014-03-12 ENCOUNTER — Encounter: Payer: Self-pay | Admitting: Internal Medicine

## 2014-03-12 ENCOUNTER — Ambulatory Visit (INDEPENDENT_AMBULATORY_CARE_PROVIDER_SITE_OTHER): Payer: PRIVATE HEALTH INSURANCE | Admitting: Internal Medicine

## 2014-03-12 VITALS — BP 159/112 | HR 111 | Temp 98.6°F

## 2014-03-12 DIAGNOSIS — R350 Frequency of micturition: Secondary | ICD-10-CM

## 2014-03-12 DIAGNOSIS — M869 Osteomyelitis, unspecified: Secondary | ICD-10-CM

## 2014-03-12 LAB — CBC WITH DIFFERENTIAL/PLATELET
Basophils Absolute: 0 10*3/uL (ref 0.0–0.1)
Basophils Relative: 0 % (ref 0–1)
Eosinophils Absolute: 0.1 10*3/uL (ref 0.0–0.7)
Eosinophils Relative: 1 % (ref 0–5)
HCT: 37 % — ABNORMAL LOW (ref 39.0–52.0)
Hemoglobin: 11.7 g/dL — ABNORMAL LOW (ref 13.0–17.0)
Lymphocytes Relative: 25 % (ref 12–46)
Lymphs Abs: 2.9 10*3/uL (ref 0.7–4.0)
MCH: 22.3 pg — ABNORMAL LOW (ref 26.0–34.0)
MCHC: 31.6 g/dL (ref 30.0–36.0)
MCV: 70.5 fL — ABNORMAL LOW (ref 78.0–100.0)
Monocytes Absolute: 1.5 10*3/uL — ABNORMAL HIGH (ref 0.1–1.0)
Monocytes Relative: 13 % — ABNORMAL HIGH (ref 3–12)
Neutro Abs: 7 10*3/uL (ref 1.7–7.7)
Neutrophils Relative %: 61 % (ref 43–77)
Platelets: 421 10*3/uL — ABNORMAL HIGH (ref 150–400)
RBC: 5.25 MIL/uL (ref 4.22–5.81)
RDW: 18.9 % — ABNORMAL HIGH (ref 11.5–15.5)
WBC: 11.4 10*3/uL — ABNORMAL HIGH (ref 4.0–10.5)

## 2014-03-12 LAB — BASIC METABOLIC PANEL
BUN: 9 mg/dL (ref 6–23)
CO2: 31 mEq/L (ref 19–32)
Calcium: 9.2 mg/dL (ref 8.4–10.5)
Chloride: 99 mEq/L (ref 96–112)
Creat: 0.69 mg/dL (ref 0.50–1.35)
Glucose, Bld: 76 mg/dL (ref 70–99)
Potassium: 4 mEq/L (ref 3.5–5.3)
Sodium: 139 mEq/L (ref 135–145)

## 2014-03-12 LAB — C-REACTIVE PROTEIN: CRP: 5 mg/dL — ABNORMAL HIGH (ref <0.60)

## 2014-03-12 LAB — SEDIMENTATION RATE: Sed Rate: 40 mm/hr — ABNORMAL HIGH (ref 0–16)

## 2014-03-12 NOTE — Progress Notes (Signed)
Subjective:    Patient ID: [name], male    DOB: 1986-09-08, 28 y.o.   MRN: [id number]  HPI [name] is a 28yo M with paraplegia and chronic leg wounds, Left Leg also has MRSA osteomyelitis. He has received several course of IV antibiotics this past winter and converted to oral antibiotics on  01/21/14 where we placed him on doxycycline and cipro. At our last visit, his left leg lateral lesion measures 6.6 cm x 17.5c mlong x 5.5 cm at heal. 0.5cm deepest area with tendon exposure Left 1st toe/dorsum of foot 4.5cm by 4 cm.   Today:6.5cm long by 5.5 cm wide(most superior lesion) extends down to heal (length is 15cm). Area between toes is unchanged in size but has heavy exudate (bilaterally)  Lab Results  Component Value Date   CRP 5.7* 01/21/2014   Lab Results  Component Value Date   ESRSEDRATE 34* 02/11/2014   Current Outpatient Prescriptions on File Prior to Visit  Medication Sig Dispense Refill  . baclofen (LIORESAL) 20 MG tablet Take 40-60 mg by mouth 3 (three) times daily. 60mg  in the morning, 40mg  in the afternoon, and 60mg  in the evening      . diazepam (VALIUM) 10 MG tablet Take 1 tablet (10 mg total) by mouth at bedtime.  30 tablet    . imipramine (TOFRANIL) 50 MG tablet Take 50 mg by mouth at bedtime.       Marland Kitchen loratadine (CLARITIN) 10 MG tablet Take 10 mg by mouth daily as needed for allergies.      . Multiple Vitamin (MULTIVITAMIN) tablet Take 1 tablet by mouth daily.      Marland Kitchen oxybutynin (DITROPAN XL) 15 MG 24 hr tablet Take 30 mg by mouth at bedtime.       . sertraline (ZOLOFT) 50 MG tablet Take 50 mg by mouth every morning.      . ciprofloxacin (CIPRO) 750 MG tablet Take 1 tablet (750 mg total) by mouth 2 (two) times daily.  60 tablet  0  . doxycycline (VIBRA-TABS) 100 MG tablet Take 1 tablet (100 mg total) by mouth 2 (two) times daily.  60 tablet  0   No current facility-administered medications on file prior to visit.   Active Ambulatory Problems    Diagnosis Date Noted  .  Decubitus ulcer of left leg 04/27/2012  . 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 History of DVT (deep vein thrombosis) 05/01/2012  . 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 Leg fracture, right 05/01/2012  . Lower paraplegia   . Spinal cord injury of T10 vertebra   . History of DVT of lower extremity   . Leukocytosis 06/26/2012  . Infected decubitus ulcer 06/26/2012  . Allergy to meropenem 02/27/2013  . Lower extremity cellulitis 07/16/2013  . Sepsis 07/16/2013  . Acute renal failure 07/16/2013  . Anemia 09/11/2013  . Thrombocytosis 09/11/2013  . Osteomyelitis of toe of left foot 10/03/2013  . Cellulitis 12/20/2013  . Osteomyelitis 12/20/2013   Resolved Ambulatory Problems    Diagnosis Date Noted  . No Resolved Ambulatory Problems   Past Medical History  Diagnosis Date  . Seasonal allergies   . Mild acid reflux WATCHES DIET  . Urge incontinence   . Spastic neurogenic bladder   . Self-catheterizes urinary bladder 4 TO 6 TIMES DAILY AND PRN  . Leg ulcer, left   . OSA on CPAP MODERATE  . Wheelchair bound       Review of Systems Urinary leakage and frequency  which he gets with uti. No fever or chills. 10 point ros otherwise negative    Objective:   Physical Exam BP 159/112  Pulse 111  Temp(Src) 98.6 F (37 C) (Oral) gen = sitting in wheelchair in no discomfort or distress Neuro = lower extremity flaccidity with occ spasm with dressing change Left leg lateral lesion= 6.5cm long by 5.5 cm wide(most superior lesion) extends down to heal (length is 15cm). Area between toes is unchanged in size but has heavy exudate (bilaterally)       Assessment & Plan:  MRSA osteo and chronic ulcers =Will check sed rate and crp. Plus ua and urine cx. If normalized inflam then will stop abtx but if still elevated will continue with chronic suppression  Chronic ulcer = recommend to do daily dressing changes rather than qod  rtc in 4 wk

## 2014-03-13 LAB — URINALYSIS, MICROSCOPIC ONLY
Casts: NONE SEEN
Crystals: NONE SEEN
Squamous Epithelial / LPF: NONE SEEN

## 2014-03-13 LAB — URINALYSIS, ROUTINE W REFLEX MICROSCOPIC
Bilirubin Urine: NEGATIVE
Glucose, UA: NEGATIVE mg/dL
Hgb urine dipstick: NEGATIVE
Ketones, ur: NEGATIVE mg/dL
Nitrite: POSITIVE — AB
Protein, ur: >300 mg/dL — AB
Specific Gravity, Urine: 1.017 (ref 1.005–1.030)
Urobilinogen, UA: 0.2 mg/dL (ref 0.0–1.0)
pH: 6.5 (ref 5.0–8.0)

## 2014-03-14 ENCOUNTER — Telehealth: Payer: Self-pay | Admitting: *Deleted

## 2014-03-14 ENCOUNTER — Other Ambulatory Visit: Payer: Self-pay | Admitting: Internal Medicine

## 2014-03-14 DIAGNOSIS — M869 Osteomyelitis, unspecified: Secondary | ICD-10-CM

## 2014-03-14 MED ORDER — DOXYCYCLINE HYCLATE 100 MG PO TABS: 100.0000 mg | ORAL_TABLET | Freq: Two times a day (BID) | ORAL | Status: DC

## 2014-03-14 MED ORDER — CIPROFLOXACIN HCL 750 MG PO TABS: 750.0000 mg | ORAL_TABLET | Freq: Two times a day (BID) | ORAL | Status: DC

## 2014-03-14 NOTE — Telephone Encounter (Signed)
Per Dr Baxter Flattery to advise the patient to restart his Doxy and Cipro and to take until his June appt with Dr Baxter Flattery. Advised the patient the medications are already at the pharmacy ready for pick up. He advised he understands and advised him to call the office with any questions.

## 2014-03-15 ENCOUNTER — Telehealth: Payer: Self-pay | Admitting: Internal Medicine

## 2014-03-15 ENCOUNTER — Other Ambulatory Visit: Payer: Self-pay | Admitting: Internal Medicine

## 2014-03-15 DIAGNOSIS — N390 Urinary tract infection, site not specified: Secondary | ICD-10-CM

## 2014-03-15 LAB — URINE CULTURE: Colony Count: >=100000

## 2014-03-15 MED ORDER — NITROFURANTOIN MONOHYD MACRO 100 MG PO CAPS: 100.0000 mg | ORAL_CAPSULE | Freq: Two times a day (BID) | ORAL | Status: DC

## 2014-03-15 NOTE — Telephone Encounter (Signed)
Spoke to patient regarding ecoli uti found on cx. Isolate is R cipro, but S nitrofurantoin. I called in rx and let patient know to pick up today to start tx for next 7 days

## 2014-05-16 ENCOUNTER — Ambulatory Visit: Payer: PRIVATE HEALTH INSURANCE | Admitting: Internal Medicine

## 2014-05-21 ENCOUNTER — Telehealth: Payer: Self-pay | Admitting: *Deleted

## 2014-05-21 NOTE — Telephone Encounter (Signed)
Patient called and advised that the wound care center thinks he should be seen here as they suspect that his legs are infected again. Reminded him he missed an appt 05/16/14 and that Dr Baxter Flattery does not have anything until end of month. The patient wanted to be seen today or tomorrow and advised him we have nothing until next week but that I will get him in the first available. He advised he as an appt with his PCP tomorrow but also wants to be seen here also. Gave him an appt with Dr Megan Salon for 05/27/14. Patient also reports no fever, chills, shortness of breath or rashes. No symptoms to report however advised him if anything changes due to his history he can always go to the ED.

## 2014-05-27 ENCOUNTER — Ambulatory Visit (INDEPENDENT_AMBULATORY_CARE_PROVIDER_SITE_OTHER): Payer: PRIVATE HEALTH INSURANCE | Admitting: Internal Medicine

## 2014-05-27 ENCOUNTER — Encounter: Payer: Self-pay | Admitting: Internal Medicine

## 2014-05-27 VITALS — BP 143/75 | HR 99 | Temp 98.2°F

## 2014-05-27 DIAGNOSIS — N3000 Acute cystitis without hematuria: Secondary | ICD-10-CM

## 2014-05-27 DIAGNOSIS — N390 Urinary tract infection, site not specified: Secondary | ICD-10-CM | POA: Insufficient documentation

## 2014-05-27 DIAGNOSIS — N3001 Acute cystitis with hematuria: Secondary | ICD-10-CM

## 2014-05-27 MED ORDER — CLINDAMYCIN HCL 300 MG PO CAPS: 300.0000 mg | ORAL_CAPSULE | Freq: Three times a day (TID) | ORAL | Status: DC
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
## 2014-06-26 ENCOUNTER — Ambulatory Visit: Payer: PRIVATE HEALTH INSURANCE | Admitting: Internal Medicine

## 2014-06-27 ENCOUNTER — Ambulatory Visit: Payer: PRIVATE HEALTH INSURANCE | Admitting: Internal Medicine

## 2014-07-17 ENCOUNTER — Ambulatory Visit: Payer: PRIVATE HEALTH INSURANCE | Admitting: Internal Medicine

## 2014-07-25 ENCOUNTER — Ambulatory Visit (INDEPENDENT_AMBULATORY_CARE_PROVIDER_SITE_OTHER): Payer: PRIVATE HEALTH INSURANCE | Admitting: Internal Medicine

## 2014-07-25 DIAGNOSIS — R350 Frequency of micturition: Secondary | ICD-10-CM

## 2014-07-25 DIAGNOSIS — Z23 Encounter for immunization: Secondary | ICD-10-CM

## 2014-07-25 NOTE — Progress Notes (Signed)
Subjective:    Patient ID: [name], male    DOB: 1986/09/25, 28 y.o.   MRN: [id number]  HPI [name] is a 28yo M with SCI of T10 with lower extemity paraplegia. He has had chronic leg ulcer for many years and in the last 12 months area is more consistent with osteomyelitis of left lower leg. He is s/p skin graft to the ulcer but one areas did have recurrent ulceration thought to be due to improper off loading. I last saw him in Spring 2015 where his wounds looked worse in part macerated with heavy drainage, not frequent enough dressing changes, treated with IV antibiotics at that time. He saw my partner dr Megan Salon in July, where he did not reinitiate antibiotics but refer [name] back to [facility]. He is followed by Dr. Vernona Rieger and has been seeking care at [facility] where work up has revealed chronic venous insufficiency, mostly severe deep insufficiency and mild superficial insufficiency with normal ABi. He has been getting 3-4 layer leg wraps fo strict compression which has helped significantly with wound care. Today, his leg looks impressively better, in fact, the best that i have seen in a year.Left leg ulcers measure 5cm long x 2 cm wide and 3 cm long x 1.5cm wide. Both shallow ulcers. Good granulation bed. He gets twice a week dressing changes in addition to what he does at home  He has been off of antibiotics since beginning of august  No fever, chills, having urinary frequency, cloudy uine, foul smelling urine and bladder spasticity which he is concerned that he may be having another uti  Current Outpatient Prescriptions on File Prior to Visit  Medication Sig Dispense Refill  . baclofen (LIORESAL) 20 MG tablet Take 40-60 mg by mouth 3 (three) times daily. 60mg  in the morning, 40mg  in the afternoon, and 60mg  in the evening      . diazepam (VALIUM) 10 MG tablet Take 1 tablet (10 mg total) by mouth at bedtime.  30 tablet    . imipramine (TOFRANIL) 50 MG tablet Take 50 mg by mouth at  bedtime.       Marland Kitchen lisinopril-hydrochlorothiazide (PRINZIDE,ZESTORETIC) 20-25 MG per tablet Take 1 tablet by mouth daily.      Marland Kitchen loratadine (CLARITIN) 10 MG tablet Take 10 mg by mouth daily as needed for allergies.      . Multiple Vitamin (MULTIVITAMIN) tablet Take 1 tablet by mouth daily.      . nitrofurantoin, macrocrystal-monohydrate, (MACROBID) 100 MG capsule Take 1 capsule (100 mg total) by mouth 2 (two) times daily.  14 capsule  1  . oxybutynin (DITROPAN XL) 15 MG 24 hr tablet Take 30 mg by mouth at bedtime.       . sertraline (ZOLOFT) 50 MG tablet Take 50 mg by mouth every morning.      . clindamycin (CLEOCIN) 300 MG capsule Take 1 capsule (300 mg total) by mouth 3 (three) times daily.  9 capsule  0   No current facility-administered medications on file prior to visit.   Active Ambulatory Problems    Diagnosis Date Noted  . Decubitus ulcer of left leg 04/27/2012  . 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 History of DVT (deep vein thrombosis) 05/01/2012  . 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 Leg fracture, right 05/01/2012  . Lower paraplegia   . Spinal cord injury of T10 vertebra   . History of DVT of lower extremity   . Leukocytosis 06/26/2012  . Infected  decubitus ulcer 06/26/2012  . Allergy to meropenem 02/27/2013  . Lower extremity cellulitis 07/16/2013  . Sepsis 07/16/2013  . Acute renal failure 07/16/2013  . Anemia 09/11/2013  . Thrombocytosis 09/11/2013  . Osteomyelitis of toe of left foot 10/03/2013  . Cellulitis 12/20/2013  . Osteomyelitis 12/20/2013  . UTI (urinary tract infection) 05/27/2014   Resolved Ambulatory Problems    Diagnosis Date Noted  . No Resolved Ambulatory Problems   Past Medical History  Diagnosis Date  . Seasonal allergies   . Mild acid reflux WATCHES DIET  . Urge incontinence   . Spastic neurogenic bladder   . Self-catheterizes urinary bladder 4 TO 6 TIMES DAILY AND PRN  . Leg ulcer, left   . OSA on CPAP MODERATE  .  Wheelchair bound    Soc hx: getting married in mid [place]   Review of Systems Per hpi, concerning for uti symptoms. No fever, chills, rigors, nightsweats.    Objective:   Physical Exam There were no vitals taken for this visit. Physical Exam  Constitutional: He is oriented to person, place, and time. He appears well-developed and well-nourished. No distress.  HENT:  Mouth/Throat: Oropharynx is clear and moist. No oropharyngeal exudate.  Cardiovascular: Normal rate, regular rhythm and normal heart sounds. Exam reveals no gallop and no friction rub.  No murmur heard.  Pulmonary/Chest: Effort normal and breath sounds normal. No respiratory distress. He has no wheezes.  Abdominal: Soft. Bowel sounds are normal. He exhibits no distension. There is no tenderness.  Lymphadenopathy:  He has no cervical adenopathy.  Neurological: He is alert and oriented to person, place, and time.  Skin: Skin is warm and dry. No rash noted. No erythema.  Psychiatric: He has a normal mood and affect. His behavior is normal.          Assessment & Plan:  Chronic leg ulcer/osteomyelitis = continue with wound  Care recommendations of doing strict compression wraps which appear to be helping significantly with wound healing.  Continue to stop smoking  urinary symptoms = he is colonized with MDRO.  We will check ua, micoscopy and urine culture to decide on abtx regimen  Health maintenance = give flu shot  rtc in 4-6 wk

## 2014-07-26 LAB — URINALYSIS, MICROSCOPIC ONLY
Casts: NONE SEEN
Crystals: NONE SEEN
Squamous Epithelial / LPF: NONE SEEN
WBC, UA: >50 WBC/hpf — AB (ref <3)

## 2014-07-26 LAB — URINALYSIS, ROUTINE W REFLEX MICROSCOPIC
Bilirubin Urine: NEGATIVE
Glucose, UA: NEGATIVE mg/dL
Ketones, ur: NEGATIVE mg/dL
Nitrite: NEGATIVE
Protein, ur: 30 mg/dL — AB
Specific Gravity, Urine: 1.012 (ref 1.005–1.030)
Urobilinogen, UA: 0.2 mg/dL (ref 0.0–1.0)
pH: 6 (ref 5.0–8.0)

## 2014-07-27 ENCOUNTER — Other Ambulatory Visit: Payer: Self-pay | Admitting: Internal Medicine

## 2014-07-27 DIAGNOSIS — N390 Urinary tract infection, site not specified: Secondary | ICD-10-CM

## 2014-07-27 MED ORDER — FOSFOMYCIN TROMETHAMINE 3 G PO PACK: 3.0000 g | PACK | Freq: Once | ORAL | Status: DC

## 2014-07-28 LAB — CULTURE, URINE COMPREHENSIVE: Colony Count: >=100000

## 2014-07-30 ENCOUNTER — Telehealth: Payer: Self-pay | Admitting: *Deleted

## 2014-07-30 NOTE — Telephone Encounter (Signed)
If the patient called in still having symptoms please see note from doctor below.

## 2014-07-30 NOTE — Telephone Encounter (Signed)
Message copied by Reggy Eye on Tue Jul 30, 2014 11:06 AM ------      Message from: Carlyle Basques      Created: Sat Jul 27, 2014  3:33 PM       i sent an rx for fosfomycin for 3gm x 1 with 2 refills for his ecoli uti. If he still has symptoms 48hr after taking the first dose, he should get it refilled at take 2 additional doses every other day.             i left these instruction on his voicemail. No need to ask rob what to treat him with since this should work. ------

## 2014-08-12 ENCOUNTER — Telehealth: Payer: Self-pay | Admitting: *Deleted

## 2014-08-12 NOTE — Telephone Encounter (Signed)
Left leg weeping, believes it is infected again.  Requesting appt.  Appt made w/ Dr. Linus Salmons for 08/13/14 @ 1500.

## 2014-08-13 ENCOUNTER — Encounter: Payer: Self-pay | Admitting: Internal Medicine

## 2014-08-13 ENCOUNTER — Ambulatory Visit (INDEPENDENT_AMBULATORY_CARE_PROVIDER_SITE_OTHER): Payer: PRIVATE HEALTH INSURANCE | Admitting: Internal Medicine

## 2014-08-13 VITALS — BP 111/75 | HR 86 | Temp 97.8°F

## 2014-08-13 DIAGNOSIS — T798XXA Other early complications of trauma, initial encounter: Secondary | ICD-10-CM

## 2014-08-13 DIAGNOSIS — T148XXA Other injury of unspecified body region, initial encounter: Secondary | ICD-10-CM

## 2014-08-13 DIAGNOSIS — T798XXS Other early complications of trauma, sequela: Secondary | ICD-10-CM

## 2014-08-13 DIAGNOSIS — L089 Local infection of the skin and subcutaneous tissue, unspecified: Secondary | ICD-10-CM

## 2014-08-13 DIAGNOSIS — T799XXS Unspecified early complication of trauma, sequela: Secondary | ICD-10-CM

## 2014-08-13 LAB — CBC WITH DIFFERENTIAL/PLATELET
Basophils Absolute: 0.1 10*3/uL (ref 0.0–0.1)
Basophils Relative: 1 % (ref 0–1)
Eosinophils Absolute: 0.1 10*3/uL (ref 0.0–0.7)
Eosinophils Relative: 1 % (ref 0–5)
HCT: 39.5 % (ref 39.0–52.0)
Hemoglobin: 13.2 g/dL (ref 13.0–17.0)
Lymphocytes Relative: 25 % (ref 12–46)
Lymphs Abs: 2.7 10*3/uL (ref 0.7–4.0)
MCH: 25.2 pg — ABNORMAL LOW (ref 26.0–34.0)
MCHC: 33.4 g/dL (ref 30.0–36.0)
MCV: 75.5 fL — ABNORMAL LOW (ref 78.0–100.0)
Monocytes Absolute: 1.6 10*3/uL — ABNORMAL HIGH (ref 0.1–1.0)
Monocytes Relative: 15 % — ABNORMAL HIGH (ref 3–12)
Neutro Abs: 6.3 10*3/uL (ref 1.7–7.7)
Neutrophils Relative %: 58 % (ref 43–77)
Platelets: 442 10*3/uL — ABNORMAL HIGH (ref 150–400)
RBC: 5.23 MIL/uL (ref 4.22–5.81)
RDW: 19.1 % — ABNORMAL HIGH (ref 11.5–15.5)
WBC: 10.8 10*3/uL — ABNORMAL HIGH (ref 4.0–10.5)

## 2014-08-13 LAB — BASIC METABOLIC PANEL WITH GFR
BUN: 12 mg/dL (ref 6–23)
CO2: 31 mEq/L (ref 19–32)
Calcium: 10 mg/dL (ref 8.4–10.5)
Chloride: 96 mEq/L (ref 96–112)
Creat: 0.81 mg/dL (ref 0.50–1.35)
GFR, Est African American: >89 mL/min
GFR, Est Non African American: >89 mL/min
Glucose, Bld: 68 mg/dL — ABNORMAL LOW (ref 70–99)
Potassium: 4.6 mEq/L (ref 3.5–5.3)
Sodium: 134 mEq/L — ABNORMAL LOW (ref 135–145)

## 2014-08-13 LAB — C-REACTIVE PROTEIN: CRP: 7 mg/dL — ABNORMAL HIGH (ref <0.60)

## 2014-08-13 NOTE — Progress Notes (Signed)
   Subjective:    Patient ID: [name], male    DOB: 10/13/1986, 28 y.o.   MRN: [id number]  HPI 28yo M with paraplegia with chronic leg ulcer c/b staphylococcus aureus. He has been receiving compression wraps to help with venous stasis but in the last week he has been having Increasing erythema nad swelling to lower extremities. Some increase drainage for pressure ulcers. No fever, chills, nightsweats. Concerning for infection. He denies any urinary symptoms  Allergies  Allergen Reactions  . Sulfa Antibiotics Hives  . Levofloxacin Rash  . Meropenem Rash  . Penicillins Rash  . Zyvox [Linezolid] Rash   Current Outpatient Prescriptions on File Prior to Visit  Medication Sig Dispense Refill  . baclofen (LIORESAL) 20 MG tablet Take 40-60 mg by mouth 3 (three) times daily. 60mg  in the morning, 40mg  in the afternoon, and 60mg  in the evening      . diazepam (VALIUM) 10 MG tablet Take 1 tablet (10 mg total) by mouth at bedtime.  30 tablet    . imipramine (TOFRANIL) 50 MG tablet Take 50 mg by mouth at bedtime.       Marland Kitchen lisinopril-hydrochlorothiazide (PRINZIDE,ZESTORETIC) 20-25 MG per tablet Take 1 tablet by mouth daily.      Marland Kitchen loratadine (CLARITIN) 10 MG tablet Take 10 mg by mouth daily as needed for allergies.      . Multiple Vitamin (MULTIVITAMIN) tablet Take 1 tablet by mouth daily.      Marland Kitchen oxybutynin (DITROPAN XL) 15 MG 24 hr tablet Take 30 mg by mouth at bedtime.       . sertraline (ZOLOFT) 50 MG tablet Take 50 mg by mouth every morning.      . clindamycin (CLEOCIN) 300 MG capsule Take 1 capsule (300 mg total) by mouth 3 (three) times daily.  9 capsule  0  . fosfomycin (MONUROL) 3 G PACK Take 3 g by mouth once.  1 g  2  . nitrofurantoin, macrocrystal-monohydrate, (MACROBID) 100 MG capsule Take 1 capsule (100 mg total) by mouth 2 (two) times daily.  14 capsule  1   No current facility-administered medications on file prior to visit.   Review of Systems Per hpi    Objective:   Physical  Exam  BP 111/75  Pulse 86  Temp(Src) 97.8 F (36.6 C) (Oral) Physical Exam  Constitutional: He is oriented to person, place, and time. He appears well-developed and well-nourished. No distress.  Neuro= increased spasticity in right lower legs Skin: +1 edema, erythema, with possibly bruising. Ulcers no increase in size than last visit but appear to have some exudate in larger lesions  Lab Results  Component Value Date   ESRSEDRATE 47* 08/13/2014   Lab Results  Component Value Date   CRP 7.0* 08/13/2014   Lab Results  Component Value Date   WBC 10.8* 08/13/2014   HGB 13.2 08/13/2014   HCT 39.5 08/13/2014   MCV 75.5* 08/13/2014   PLT 442* 08/13/2014       Assessment & Plan:  Decub ulcer = concern for recurrent infection. Will do wound swab. Will check cbc with diff, sed rate, crp. Will prescribe doxycycline 100mg  bid  Health maintenance = he received flu shot at last visit

## 2014-08-13 NOTE — Telephone Encounter (Signed)
If this is Dr. Storm Frisk pt,and she is here, why was he scheduled overbook with me?

## 2014-08-14 LAB — SEDIMENTATION RATE: Sed Rate: 47 mm/hr — ABNORMAL HIGH (ref 0–16)

## 2014-08-16 MED ORDER — DOXYCYCLINE HYCLATE 100 MG PO TABS: 100.0000 mg | ORAL_TABLET | Freq: Two times a day (BID) | ORAL | Status: DC

## 2014-08-16 NOTE — Telephone Encounter (Signed)
We saw him in clinic. And started on doxycycline

## 2014-08-17 LAB — WOUND CULTURE
Gram Stain: NONE SEEN
Gram Stain: NONE SEEN

## 2014-08-22 ENCOUNTER — Telehealth: Payer: Self-pay | Admitting: Licensed Clinical Social Worker

## 2014-08-22 NOTE — Telephone Encounter (Signed)
I am worried that he may be having SIRS associated with recurrent wound infection. i would recommend that he gets seen by an MD today. I am overbooked this afternoon and can't add him. See if he can be seen by PCP or go the ED

## 2014-08-22 NOTE — Telephone Encounter (Signed)
He is going to try calling his PCP to see if they could work him in. I told him to let us know what happens

## 2014-08-22 NOTE — Telephone Encounter (Signed)
Patient called wondering if Dr. Baxter Flattery would like to do a MRI to see if his osteomyelitis is gone. Patient is also experiencing more drainage, more muscle spasms, and low grade temperatures. He would like to know if he needs to continue the Doxycycline or does he need an appointment? Please advise

## 2014-08-26 NOTE — Telephone Encounter (Addendum)
Patient went to see PCP (Dr. Orland Penman), had wound cultured (pt states "it smelled like pseudomonas").  Dr. Orland Penman placed pt on cipro (750 mg?)  Pt would like to know if he should stop the doxycycline or continue taking it plus the cipro.  Please advise.

## 2014-08-26 NOTE — Telephone Encounter (Signed)
Patient called asking to see if he can be seen anytime this week for his leg.  Patient did not follow up last week with his PCP/ED (stated that there were no appointments last week).  No available appointments at [facility].  Patient is advised to contact his PCP to be seen today, if unable, advised to go to the ED as was directed last week.

## 2014-08-27 NOTE — Telephone Encounter (Signed)
Pt.notified

## 2014-08-27 NOTE — Telephone Encounter (Signed)
Wound cultures are not terribly informative..but pt did grow MRSA from culture taken here that were sensitive to doxy so I would recommend continuing the doxy with the cipro. Pt needs to be seen here in followup

## 2014-08-27 NOTE — Telephone Encounter (Signed)
Thanks Michelle

## 2014-09-05 ENCOUNTER — Ambulatory Visit: Payer: PRIVATE HEALTH INSURANCE | Admitting: Internal Medicine

## 2014-09-24 ENCOUNTER — Encounter: Payer: Self-pay | Admitting: Internal Medicine

## 2014-09-24 ENCOUNTER — Ambulatory Visit (INDEPENDENT_AMBULATORY_CARE_PROVIDER_SITE_OTHER): Payer: PRIVATE HEALTH INSURANCE | Admitting: Internal Medicine

## 2014-09-24 VITALS — BP 124/73 | HR 107 | Temp 98.1°F

## 2014-09-24 DIAGNOSIS — L97909 Non-pressure chronic ulcer of unspecified part of unspecified lower leg with unspecified severity: Secondary | ICD-10-CM

## 2014-09-24 MED ORDER — CADEXOMER IODINE 0.9 % EX GEL: 1.0000 "application " | Freq: Every day | CUTANEOUS | Status: DC | PRN

## 2014-09-24 NOTE — Progress Notes (Signed)
Subjective:    Patient ID: [name], male    DOB: 02/04/1986, 28 y.o.   MRN: [id number]  HPI 28yo M with thoracic SCI c/b chronic leg wound/osteomyelitis to left leg and right foot. He continues to be seen at wound care and also pressure wraps for chronic venous stasis which has helped with finished course of doxy and ciprofloxacin.Pictures look improved with good granulation bed. His PCP had done superficial wound swab that identified PsA which he took cipro to improve his wound bed. He denies any fever, chills, nightsweats. He has not been to [facility] recently. Denies any dysuria, frequent urination, or foul-smelling urine.  sochx : he was married last month  Allergies  Allergen Reactions  . Sulfa Antibiotics Hives  . Levofloxacin Rash  . Meropenem Rash  . Penicillins Rash  . Zyvox [Linezolid] Rash   Current Outpatient Prescriptions on File Prior to Visit  Medication Sig Dispense Refill  . baclofen (LIORESAL) 20 MG tablet Take 40-60 mg by mouth 3 (three) times daily. 60mg  in the morning, 40mg  in the afternoon, and 60mg  in the evening    . clindamycin (CLEOCIN) 300 MG capsule Take 1 capsule (300 mg total) by mouth 3 (three) times daily. 9 capsule 0  . diazepam (VALIUM) 10 MG tablet Take 1 tablet (10 mg total) by mouth at bedtime. 30 tablet   . doxycycline (VIBRA-TABS) 100 MG tablet Take 1 tablet (100 mg total) by mouth 2 (two) times daily. 60 tablet 1  . fosfomycin (MONUROL) 3 G PACK Take 3 g by mouth once. 1 g 2  . imipramine (TOFRANIL) 50 MG tablet Take 50 mg by mouth at bedtime.     Marland Kitchen lisinopril-hydrochlorothiazide (PRINZIDE,ZESTORETIC) 20-25 MG per tablet Take 1 tablet by mouth daily.    Marland Kitchen loratadine (CLARITIN) 10 MG tablet Take 10 mg by mouth daily as needed for allergies.    . Multiple Vitamin (MULTIVITAMIN) tablet Take 1 tablet by mouth daily.    . nitrofurantoin, macrocrystal-monohydrate, (MACROBID) 100 MG capsule Take 1 capsule (100 mg total) by mouth 2 (two)  times daily. 14 capsule 1  . oxybutynin (DITROPAN XL) 15 MG 24 hr tablet Take 30 mg by mouth at bedtime.     . sertraline (ZOLOFT) 50 MG tablet Take 50 mg by mouth every morning.     No current facility-administered medications on file prior to visit.   Active Ambulatory Problems    Diagnosis Date Noted  . Decubitus ulcer of left leg 04/27/2012  . 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 History of DVT (deep vein thrombosis) 05/01/2012  . 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 Leg fracture, right 05/01/2012  . Lower paraplegia   . Spinal cord injury of T10 vertebra   . History of DVT of lower extremity   . Leukocytosis 06/26/2012  . Infected decubitus ulcer 06/26/2012  . Allergy to meropenem 02/27/2013  . Lower extremity cellulitis 07/16/2013  . Sepsis 07/16/2013  . Acute renal failure 07/16/2013  . Anemia 09/11/2013  . Thrombocytosis 09/11/2013  . Osteomyelitis of toe of left foot 10/03/2013  . Cellulitis 12/20/2013  . Osteomyelitis 12/20/2013  . UTI (urinary tract infection) 05/27/2014   Resolved Ambulatory Problems    Diagnosis Date Noted  . No Resolved Ambulatory Problems   Past Medical History  Diagnosis Date  . Seasonal allergies   . Mild acid reflux WATCHES DIET  . Urge incontinence   . Spastic neurogenic bladder   . Self-catheterizes urinary bladder 4  TO 6 TIMES DAILY AND PRN  . Leg ulcer, left   . OSA on CPAP MODERATE  . Wheelchair bound      Review of Systems Positive pertinents listed above in hpi.    Objective:   Physical Exam BP 124/73 mmHg  Pulse 107  Temp(Src) 98.1 F (36.7 C) (Oral)  Wt  gen = a x o by 3 in NAD      Assessment & Plan:  Chronic leg ulcer c/b cellulitis, osteomyelitis = will iodosorb rx to help with wound care. Recommend he follows up with [facility] to see if any other changes needed. For now pictures appear that he is having majority of area looking like he has good granulation wound bed. Will hold off on  antibiotics for now

## 2014-09-30 ENCOUNTER — Ambulatory Visit: Payer: PRIVATE HEALTH INSURANCE | Admitting: Internal Medicine

## 2014-10-31 ENCOUNTER — Ambulatory Visit: Payer: PRIVATE HEALTH INSURANCE | Admitting: Internal Medicine

## 2014-12-03 ENCOUNTER — Ambulatory Visit: Payer: PRIVATE HEALTH INSURANCE | Admitting: Internal Medicine

## 2014-12-04 ENCOUNTER — Ambulatory Visit: Payer: PRIVATE HEALTH INSURANCE | Admitting: Internal Medicine

## 2015-12-16 ENCOUNTER — Ambulatory Visit: Payer: PRIVATE HEALTH INSURANCE | Admitting: Surgery

## 2015-12-19 ENCOUNTER — Encounter: Payer: BLUE CROSS/BLUE SHIELD | Attending: Surgery | Admitting: Surgery

## 2015-12-19 DIAGNOSIS — R32 Unspecified urinary incontinence: Secondary | ICD-10-CM | POA: Diagnosis not present

## 2015-12-19 DIAGNOSIS — Z87891 Personal history of nicotine dependence: Secondary | ICD-10-CM | POA: Diagnosis not present

## 2015-12-19 DIAGNOSIS — L97212 Non-pressure chronic ulcer of right calf with fat layer exposed: Secondary | ICD-10-CM | POA: Diagnosis not present

## 2015-12-19 DIAGNOSIS — Z9889 Other specified postprocedural states: Secondary | ICD-10-CM | POA: Insufficient documentation

## 2015-12-19 DIAGNOSIS — Z6835 Body mass index (BMI) 35.0-35.9, adult: Secondary | ICD-10-CM | POA: Diagnosis not present

## 2015-12-19 DIAGNOSIS — G4730 Sleep apnea, unspecified: Secondary | ICD-10-CM | POA: Insufficient documentation

## 2015-12-19 DIAGNOSIS — L89513 Pressure ulcer of right ankle, stage 3: Secondary | ICD-10-CM | POA: Insufficient documentation

## 2015-12-19 DIAGNOSIS — F329 Major depressive disorder, single episode, unspecified: Secondary | ICD-10-CM | POA: Insufficient documentation

## 2015-12-19 DIAGNOSIS — G82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, complete: Secondary | ICD-10-CM | POA: Diagnosis present

## 2015-12-19 DIAGNOSIS — I10 Essential (primary) hypertension: Secondary | ICD-10-CM | POA: Insufficient documentation

## 2015-12-19 DIAGNOSIS — L89523 Pressure ulcer of left ankle, stage 3: Secondary | ICD-10-CM | POA: Diagnosis present

## 2015-12-19 DIAGNOSIS — Z9081 Acquired absence of spleen: Secondary | ICD-10-CM | POA: Insufficient documentation

## 2015-12-19 NOTE — Progress Notes (Addendum)
Muradyan, THERRON SCHORSCH ([id number]) Visit Report for 12/19/2015 Allergy List Details Patient Name: [name], [name] Date of Service: 12/19/2015 8:45 AM Medical Record Number: [id number] Patient Account Number: [id number] Date of Birth/Sex: May 24, 1986 (29 y.o. Male) Treating RN: Baruch Gouty, RN, BSN, Velva Harman Primary Care Physician: Milagros Evener Other Clinician: Referring Physician: Treating Physician/Extender: Frann Rider in Treatment: 0 Allergies Active Allergies PCN sulfa drugs Levaquin Zyvox meropenem Allergy Notes Electronic Signature(s) Signed: 12/19/2015 8:42:57 AM By: Regan Lemming BSN, RN Entered By: Regan Lemming on 12/19/2015 08:42:57 [name], [name] ([id number]) -------------------------------------------------------------------------------- Arrival Information Details Patient Name: [name] Date of Service: 12/19/2015 8:45 AM Medical Record Number: [id number] Patient Account Number: [id number] Date of Birth/Sex: 1985/12/30 (29 y.o. Male) Treating RN: Baruch Gouty, RN, BSN, Velva Harman Primary Care Physician: Milagros Evener Other Clinician: Referring Physician: Treating Physician/Extender: Frann Rider in Treatment: 0 Visit Information Patient Arrived: Wheel Chair Arrival Time: 08:49 Accompanied By: self Transfer Assistance: None Patient Identification Verified: Yes Secondary Verification Process Yes Completed: Patient Requires Transmission-Based No Precautions: Patient Has Alerts: No Electronic Signature(s) Signed: 12/19/2015 2:47:16 PM By: Regan Lemming BSN, RN Entered By: Regan Lemming on 12/19/2015 08:50:15 [name], [name] ([id number]) -------------------------------------------------------------------------------- Clinic Level of Care Assessment Details Patient Name: [name] Date of Service: 12/19/2015 8:45 AM Medical Record Number: [id number] Patient Account Number: [id number] Date of Birth/Sex: 04/13/1986 (29 y.o. Male) Treating RN: Baruch Gouty, RN, BSN, Velva Harman Primary  Care Physician: Milagros Evener Other Clinician: Referring Physician: Treating Physician/Extender: Frann Rider in Treatment: 0 Clinic Level of Care Assessment Items TOOL 1 Quantity Score []  - Use when EandM and Procedure is performed on INITIAL visit 0 ASSESSMENTS - Nursing Assessment / Reassessment X - General Physical Exam (combine w/ comprehensive assessment (listed just 1 20 below) when performed on new pt. evals) X - Comprehensive Assessment (HX, ROS, Risk Assessments, Wounds Hx, etc.) 1 25 ASSESSMENTS - Wound and Skin Assessment / Reassessment []  - Dermatologic / Skin Assessment (not related to wound area) 0 ASSESSMENTS - Ostomy and/or Continence Assessment and Care []  - Incontinence Assessment and Management 0 []  - Ostomy Care Assessment and Management (repouching, etc.) 0 PROCESS - Coordination of Care X - Simple Patient / Family Education for ongoing care 1 15 []  - Complex (extensive) Patient / Family Education for ongoing care 0 X - Staff obtains Programmer, systems, Records, Test Results / Process Orders 1 10 []  - Staff telephones HHA, Nursing Homes / Clarify orders / etc 0 []  - Routine Transfer to another Facility (non-emergent condition) 0 []  - Routine Hospital Admission (non-emergent condition) 0 X - New Admissions / Biomedical engineer / Ordering NPWT, Apligraf, etc. 1 15 []  - Emergency Hospital Admission (emergent condition) 0 PROCESS - Special Needs []  - Pediatric / Minor Patient Management 0 []  - Isolation Patient Management 0 [place], Timtohy E. ([id number]) []  - Hearing / Language / Visual special needs 0 []  - Assessment of Community assistance (transportation, D/C planning, etc.) 0 []  - Additional assistance / Altered mentation 0 []  - Support Surface(s) Assessment (bed, cushion, seat, etc.) 0 INTERVENTIONS - Miscellaneous []  - External ear exam 0 []  - Patient Transfer (multiple staff / Civil Service fast streamer / Similar devices) 0 []  - Simple Staple / Suture removal (25 or  less) 0 []  - Complex Staple / Suture removal (26 or more) 0 []  - Hypo/Hyperglycemic Management (do not check if billed separately) 0 X - Ankle / Brachial Index (ABI) - do not check if billed separately 1 15 Has the patient been seen at the  hospital within the last three years: Yes Total Score: 100 Level Of Care: New/Established - Level 3 Electronic Signature(s) Signed: 12/19/2015 2:47:16 PM By: Regan Lemming BSN, RN Entered By: Regan Lemming on 12/19/2015 09:38:27 [name], [name] ([id number]) -------------------------------------------------------------------------------- Encounter Discharge Information Details Patient Name: [name] Date of Service: 12/19/2015 8:45 AM Medical Record Number: [id number] Patient Account Number: [id number] Date of Birth/Sex: Apr 08, 1986 (29 y.o. Male) Treating RN: Baruch Gouty, RN, BSN, Velva Harman Primary Care Physician: Milagros Evener Other Clinician: Referring Physician: Treating Physician/Extender: Frann Rider in Treatment: 0 Encounter Discharge Information Items Discharge Pain Level: 0 Discharge Condition: Stable Ambulatory Status: Wheelchair Discharge Destination: Home Transportation: Private Auto Accompanied By: self Schedule Follow-up Appointment: No Medication Reconciliation completed No and provided to Patient/Care Azelia Reiger: Patient Clinical Summary of Care: Declined Electronic Signature(s) Signed: 12/19/2015 2:47:16 PM By: Regan Lemming BSN, RN Previous Signature: 12/19/2015 9:59:53 AM Version By: Ruthine Dose Entered By: Regan Lemming on 12/19/2015 10:02:07 [name], [name] ([id number]) -------------------------------------------------------------------------------- Lower Extremity Assessment Details Patient Name: [name] Date of Service: 12/19/2015 8:45 AM Medical Record Number: [id number] Patient Account Number: [id number] Date of Birth/Sex: 10/06/1986 (29 y.o. Male) Treating RN: Baruch Gouty, RN, BSN, Velva Harman Primary Care Physician: Milagros Evener Other Clinician: Referring Physician: Treating Physician/Extender: Frann Rider in Treatment: 0 Vascular Assessment Pulses: Posterior Tibial Palpable: [Left:No] [Right:No] Doppler: [Left:Multiphasic] [Right:Multiphasic] Dorsalis Pedis Palpable: [Left:Yes] [Right:Yes] Doppler: [Left:Multiphasic] [Right:Multiphasic] Extremity colors, hair growth, and conditions: Extremity Color: [Left:Pale] [Right:Pale] Hair Growth on Extremity: [Left:Yes] [Right:Yes] Temperature of Extremity: [Left:Warm] [Right:Warm] Capillary Refill: [Left:< 3 seconds] [Right:< 3 seconds] Blood Pressure: Brachial: [Left:141] Dorsalis Pedis: 112 [Left:Dorsalis Pedis: [id number] Ankle: Posterior Tibial: [Left:Posterior Tibial: 0.79] [Right:0.82] Toe Nail Assessment Left: Right: Thick: Yes Yes Discolored: Yes Yes Deformed: Yes Yes Improper Length and Hygiene: Yes Yes Electronic Signature(s) Signed: 12/19/2015 9:20:02 AM By: Regan Lemming BSN, RN Entered By: Regan Lemming on 12/19/2015 09:20:02 [name], [name] ([id number]) -------------------------------------------------------------------------------- Multi Wound Chart Details Patient Name: [name] Date of Service: 12/19/2015 8:45 AM Medical Record Number: [id number] Patient Account Number: [id number] Date of Birth/Sex: December 30, 1985 (29 y.o. Male) Treating RN: Baruch Gouty, RN, BSN, Velva Harman Primary Care Physician: Milagros Evener Other Clinician: Referring Physician: Treating Physician/Extender: Frann Rider in Treatment: 0 Vital Signs Height(in): 70 Pulse(bpm): 100 Weight(lbs): 250 Blood Pressure 141/86 (mmHg): Body Mass Index(BMI): 36 Temperature(F): 97.8 Respiratory Rate 20 (breaths/min): Photos: [1:No Photos] [2:No Photos] [3:No Photos] Wound Location: [1:Left Achilles] [2:Right Achilles] [3:Right Lower Leg - Anterior] Wounding Event: [1:Gradually Appeared] [2:Gradually Appeared] [3:Gradually Appeared] Primary Etiology: [1:To be  determined] [2:To be determined] [3:To be determined] Comorbid History: [1:Sleep Apnea, Hypertension] [2:Sleep Apnea, Hypertension] [3:Sleep Apnea, Hypertension] Date Acquired: [1:11/18/2015] [2:11/18/2015] [3:11/18/2015] Weeks of Treatment: [1:0] [2:0] [3:0] Wound Status: [1:Open] [2:Open] [3:Open] Measurements L x W x D 3x3x0.1 [2:1x1x0.1] [3:3x3x0.1] (cm) Area (cm) : [1:7.069] [2:0.785] [3:7.069] Volume (cm) : [1:0.707] [2:0.079] [3:0.707] % Reduction in Area: [1:N/A] [2:0.00%] [3:0.00%] % Reduction in Volume: N/A [2:0.00%] [3:0.00%] Classification: [1:Full Thickness Without Exposed Support Structures] [2:Full Thickness Without Exposed Support Structures] [3:Full Thickness Without Exposed Support Structures] Exudate Amount: [1:Medium] [2:Medium] [3:Medium] Exudate Type: [1:Serosanguineous] [2:Serosanguineous] [3:Serosanguineous] Exudate Color: [1:red, brown] [2:red, brown] [3:red, brown] Foul Odor After [1:Yes] [2:Yes] [3:Yes] Cleansing: Odor Anticipated Due to No [2:No] [3:No] Product Use: Wound Margin: [1:Distinct, outline attached] [2:Distinct, outline attached] [3:Distinct, outline attached] Granulation Amount: [1:Large (67-100%)] [2:Large (67-100%)] [3:Large (67-100%)] Granulation Quality: [1:Red, Pink] [2:Red, Pink, Hyper- granulation] [3:Hyper-granulation] Necrotic Amount: [1:None Present (0%)] [2:N/A] [3:None Present (0%)] Exposed Structures: Fascia: No Fascia:  No Fascia: No Fat: No Fat: No Fat: No Tendon: No Tendon: No Tendon: No Muscle: No Muscle: No Muscle: No Joint: No Joint: No Joint: No Bone: No Bone: No Bone: No Limited to Skin Limited to Skin Limited to Skin Breakdown Breakdown Breakdown Epithelialization: None None None Periwound Skin Texture: Edema: Yes Edema: Yes Edema: Yes Excoriation: No Excoriation: No Excoriation: No Induration: No Induration: No Induration: No Callus: No Callus: No Callus: No Crepitus: No Crepitus: No Crepitus:  No Fluctuance: No Fluctuance: No Fluctuance: No Friable: No Friable: No Friable: No Rash: No Rash: No Rash: No Scarring: No Scarring: No Scarring: No Periwound Skin Moist: Yes Moist: Yes Moist: Yes Moisture: Maceration: No Maceration: No Maceration: No Dry/Scaly: No Dry/Scaly: No Dry/Scaly: No Periwound Skin Color: Atrophie Blanche: No Atrophie Blanche: No Atrophie Blanche: No Cyanosis: No Cyanosis: No Cyanosis: No Ecchymosis: No Ecchymosis: No Ecchymosis: No Erythema: No Erythema: No Erythema: No Hemosiderin Staining: No Hemosiderin Staining: No Hemosiderin Staining: No Mottled: No Mottled: No Mottled: No Pallor: No Pallor: No Pallor: No Rubor: No Rubor: No Rubor: No Temperature: No Abnormality No Abnormality No Abnormality Tenderness on No No No Palpation: Wound Preparation: Ulcer Cleansing: Ulcer Cleansing: Ulcer Cleansing: Rinsed/Irrigated with Rinsed/Irrigated with Rinsed/Irrigated with Saline Saline Saline Topical Anesthetic Topical Anesthetic Topical Anesthetic Applied: Other: lidocaine Applied: Other: lidocaine 4 Applied: Other: lidocaine 4% % 4% Treatment Notes Electronic Signature(s) Signed: 12/19/2015 2:47:16 PM By: Regan Lemming BSN, RN Entered By: Regan Lemming on 12/19/2015 09:33:16 [name], [name] ([id number]) -------------------------------------------------------------------------------- [place] Details Patient Name: [name] Date of Service: 12/19/2015 8:45 AM Medical Record Number: [id number] Patient Account Number: [id number] Date of Birth/Sex: [birth year] (29 y.o. Male) Treating RN: Baruch Gouty, RN, BSN, Velva Harman Primary Care Physician: Milagros Evener Other Clinician: Referring Physician: Treating Physician/Extender: Frann Rider in Treatment: 0 Active Inactive Abuse / Safety / Falls / Self Care Management Nursing Diagnoses: Impaired home maintenance Impaired physical mobility Knowledge deficit related  to: safety; personal, health (wound), emergency Potential for falls Self care deficit: actual or potential Goals: Patient will remain injury free Date Initiated: 12/19/2015 Goal Status: Active Patient/caregiver will verbalize understanding of skin care regimen Date Initiated: 12/19/2015 Goal Status: Active Patient/caregiver will verbalize understanding of the importance to maintain current immunizations/vaccinations Date Initiated: 12/19/2015 Goal Status: Active Patient/caregiver will verbalize/demonstrate measure taken to improve self care Date Initiated: 12/19/2015 Goal Status: Active Patient/caregiver will verbalize/demonstrate measures taken to improve the patient's personal safety Date Initiated: 12/19/2015 Goal Status: Active Patient/caregiver will verbalize/demonstrate measures taken to prevent injury and/or falls Date Initiated: 12/19/2015 Goal Status: Active Interventions: Assess fall risk on admission and as needed Assess: immobility, friction, shearing, incontinence upon admission and as needed Assess impairment of mobility on admission and as needed per policy Assess self care needs on admission and as needed Terrones, Cresencio E. ([id number]) Patient referred to community resources (specify in notes) Provide education on basic hygiene Provide education on fall prevention Provide education on personal and home safety Provide education on safe transfers Notes: Orientation to the Wound Care Program Nursing Diagnoses: Knowledge deficit related to the wound healing center program Goals: Patient/caregiver will verbalize understanding of the [place] Date Initiated: 12/19/2015 Goal Status: Active Interventions: Provide education on orientation to the wound center Notes: Wound/Skin Impairment Nursing Diagnoses: Impaired tissue integrity Knowledge deficit related to ulceration/compromised skin integrity Goals: Patient/caregiver will verbalize understanding  of skin care regimen Date Initiated: 12/19/2015 Goal Status: Active Ulcer/skin breakdown will have a volume reduction of 30% by week  4 Date Initiated: 12/19/2015 Goal Status: Active Ulcer/skin breakdown will have a volume reduction of 50% by week 8 Date Initiated: 12/19/2015 Goal Status: Active Ulcer/skin breakdown will have a volume reduction of 80% by week 12 Date Initiated: 12/19/2015 Goal Status: Active Ulcer/skin breakdown will heal within 14 weeks Date Initiated: 12/19/2015 Goal Status: Active [place], Tykel E. ([id number]) Interventions: Assess patient/caregiver ability to obtain necessary supplies Assess patient/caregiver ability to perform ulcer/skin care regimen upon admission and as needed Assess ulceration(s) every visit Provide education on ulcer and skin care Notes: Electronic Signature(s) Signed: 12/19/2015 2:47:16 PM By: Regan Lemming BSN, RN Entered By: Regan Lemming on 12/19/2015 09:33:02 [name], [name] ([id number]) -------------------------------------------------------------------------------- Pain Assessment Details Patient Name: [name] Date of Service: 12/19/2015 8:45 AM Medical Record Number: [id number] Patient Account Number: [id number] Date of Birth/Sex: 1986-07-29 (29 y.o. Male) Treating RN: Baruch Gouty, RN, BSN, [place] Primary Care Physician: Milagros Evener Other Clinician: Referring Physician: Treating Physician/Extender: Frann Rider in Treatment: 0 Active Problems Location of Pain Severity and Description of Pain Patient Has Paino No Site Locations Pain Management and Medication Current Pain Management: Electronic Signature(s) Signed: 12/19/2015 2:47:16 PM By: Regan Lemming BSN, RN Entered By: Regan Lemming on 12/19/2015 08:50:26 [place], [name] ([id number]) -------------------------------------------------------------------------------- Patient/Caregiver Education Details Patient Name: [name] Date of Service: 12/19/2015 8:45 AM Medical Record  Number: [id number] Patient Account Number: [id number] Date of Birth/Gender: 1986/09/13 (29 y.o. Male) Treating RN: Baruch Gouty, RN, BSN, Velva Harman Primary Care Physician: Milagros Evener Other Clinician: Referring Physician: Treating Physician/Extender: Frann Rider in Treatment: 0 Education Assessment Education Provided To: Patient Education Topics Provided Basic Hygiene: Methods: Explain/Verbal Responses: State content correctly Safety: Methods: Explain/Verbal Responses: State content correctly Welcome To [facility] [place]: Methods: Explain/Verbal Responses: State content correctly Wound/Skin Impairment: Methods: Explain/Verbal Responses: State content correctly Electronic Signature(s) Signed: 12/19/2015 2:47:16 PM By: Regan Lemming BSN, RN Entered By: Regan Lemming on 12/19/2015 10:02:29 [name], [name] ([id number]) -------------------------------------------------------------------------------- Wound Assessment Details Patient Name: [name] Date of Service: 12/19/2015 8:45 AM Medical Record Number: [id number] Patient Account Number: [id number] Date of Birth/Sex: 08/15/1986 (29 y.o. Male) Treating RN: Baruch Gouty, RN, BSN, Velva Harman Primary Care Physician: Milagros Evener Other Clinician: Referring Physician: Treating Physician/Extender: Frann Rider in Treatment: 0 Wound Status Wound Number: 1 Primary Etiology: To be determined Wound Location: Left Achilles Wound Status: Open Wounding Event: Gradually Appeared Comorbid History: Sleep Apnea, Hypertension Date Acquired: 11/18/2015 Weeks Of Treatment: 0 Clustered Wound: No Photos Photo Uploaded By: Regan Lemming on 12/19/2015 12:59:43 Wound Measurements Length: (cm) 3 Width: (cm) 3 Depth: (cm) 0.1 Area: (cm) 7.069 Volume: (cm) 0.707 % Reduction in Area: % Reduction in Volume: Epithelialization: None Tunneling: No Undermining: No Wound Description Full Thickness Without Exposed Classification: Support  Structures Wound Margin: Distinct, outline attached Exudate Medium Amount: [place], Jahmeir E. ([id number]) Foul Odor After Cleansing: Yes Due to Product Use: No Exudate Type: Serosanguineous Exudate Color: red, brown Wound Bed Granulation Amount: Large (67-100%) Exposed Structure Granulation Quality: Red, Pink Fascia Exposed: No Necrotic Amount: None Present (0%) Fat Layer Exposed: No Tendon Exposed: No Muscle Exposed: No Joint Exposed: No Bone Exposed: No Limited to Skin Breakdown Periwound Skin Texture Texture Color No Abnormalities Noted: No No Abnormalities Noted: No Callus: No Atrophie Blanche: No Crepitus: No Cyanosis: No Excoriation: No Ecchymosis: No Fluctuance: No Erythema: No Friable: No Hemosiderin Staining: No Induration: No Mottled: No Localized Edema: Yes Pallor: No Rash: No Rubor: No Scarring: No Temperature / Pain Moisture Temperature: No Abnormality No Abnormalities  Noted: No Dry / Scaly: No Maceration: No Moist: Yes Wound Preparation Ulcer Cleansing: Rinsed/Irrigated with Saline Topical Anesthetic Applied: Other: lidocaine 4%, Treatment Notes Wound #1 (Left Achilles) 1. Cleansed with: Clean wound with Normal Saline 4. Dressing Applied: Other dressing (specify in notes) 5. Secondary Dressing Applied Gauze and Kerlix/Conform 7. Secured with Tape Electronic Signature(s) Lemma, TREYVONN EDGELL ([id number]) Signed: 12/19/2015 2:47:16 PM By: Regan Lemming BSN, RN Entered By: Regan Lemming on 12/19/2015 09:15:17 [name], [name] ([id number]) -------------------------------------------------------------------------------- Wound Assessment Details Patient Name: [name] Date of Service: 12/19/2015 8:45 AM Medical Record Number: [id number] Patient Account Number: [id number] Date of Birth/Sex: May 12, 1986 (29 y.o. Male) Treating RN: Baruch Gouty, RN, BSN, Velva Harman Primary Care Physician: Milagros Evener Other Clinician: Referring Physician: Treating  Physician/Extender: Frann Rider in Treatment: 0 Wound Status Wound Number: 2 Primary Etiology: To be determined Wound Location: Right Achilles Wound Status: Open Wounding Event: Gradually Appeared Comorbid History: Sleep Apnea, Hypertension Date Acquired: 11/18/2015 Weeks Of Treatment: 0 Clustered Wound: No Photos Photo Uploaded By: Regan Lemming on 12/19/2015 12:59:43 Wound Measurements Length: (cm) 1 Width: (cm) 1 Depth: (cm) 0.1 Area: (cm) 0.785 Volume: (cm) 0.079 % Reduction in Area: 0% % Reduction in Volume: 0% Epithelialization: None Tunneling: No Undermining: No Wound Description Full Thickness Without Exposed Classification: Support Structures Wound Margin: Distinct, outline attached Exudate Medium Amount: Rather, Isael E. ([id number]) Foul Odor After Cleansing: Yes Due to Product Use: No Exudate Type: Serosanguineous Exudate Color: red, brown Wound Bed Granulation Amount: Large (67-100%) Exposed Structure Granulation Quality: Red, Pink, Hyper-granulation Fascia Exposed: No Fat Layer Exposed: No Tendon Exposed: No Muscle Exposed: No Joint Exposed: No Bone Exposed: No Limited to Skin Breakdown Periwound Skin Texture Texture Color No Abnormalities Noted: No No Abnormalities Noted: No Callus: No Atrophie Blanche: No Crepitus: No Cyanosis: No Excoriation: No Ecchymosis: No Fluctuance: No Erythema: No Friable: No Hemosiderin Staining: No Induration: No Mottled: No Localized Edema: Yes Pallor: No Rash: No Rubor: No Scarring: No Temperature / Pain Moisture Temperature: No Abnormality No Abnormalities Noted: No Dry / Scaly: No Maceration: No Moist: Yes Wound Preparation Ulcer Cleansing: Rinsed/Irrigated with Saline Topical Anesthetic Applied: Other: lidocaine 4 %, Treatment Notes Wound #2 (Right Achilles) 1. Cleansed with: Clean wound with Normal Saline 4. Dressing Applied: Other dressing (specify in notes) 5. Secondary  Dressing Applied Gauze and Kerlix/Conform 7. Secured with Tape Electronic Signature(s) [place], [place] ([id number]) Signed: 12/19/2015 9:17:22 AM By: Regan Lemming BSN, RN Entered By: Regan Lemming on 12/19/2015 09:17:21 [name], [name] ([id number]) -------------------------------------------------------------------------------- Wound Assessment Details Patient Name: [name] Date of Service: 12/19/2015 8:45 AM Medical Record Number: [id number] Patient Account Number: [id number] Date of Birth/Sex: [birth year] (29 y.o. Male) Treating RN: Baruch Gouty, RN, BSN, Velva Harman Primary Care Physician: Milagros Evener Other Clinician: Referring Physician: Treating Physician/Extender: Frann Rider in Treatment: 0 Wound Status Wound Number: 3 Primary Etiology: To be determined Wound Location: Right Lower Leg - Anterior Wound Status: Open Wounding Event: Gradually Appeared Comorbid History: Sleep Apnea, Hypertension Date Acquired: 11/18/2015 Weeks Of Treatment: 0 Clustered Wound: No Photos Photo Uploaded By: Regan Lemming on 12/19/2015 12:59:44 Wound Measurements Length: (cm) 3 Width: (cm) 3 Depth: (cm) 0.1 Area: (cm) 7.069 Volume: (cm) 0.707 % Reduction in Area: 0% % Reduction in Volume: 0% Epithelialization: None Tunneling: No Undermining: No Wound Description Full Thickness Without Exposed Classification: Support Structures Wound Margin: Distinct, outline attached Exudate Medium Amount: Exudate Type: Serosanguineous Exudate Color: red, brown Foul Odor After Cleansing: Yes Due to Product Use: No Wound  Bed Granulation Amount: Large (67-100%) Exposed Structure Granulation Quality: Hyper-granulation Fascia Exposed: No Necrotic Amount: None Present (0%) Fat Layer Exposed: No Dormer, Sedric E. ([id number]) Tendon Exposed: No Muscle Exposed: No Joint Exposed: No Bone Exposed: No Limited to Skin Breakdown Periwound Skin Texture Texture Color No Abnormalities Noted: No No  Abnormalities Noted: No Callus: No Atrophie Blanche: No Crepitus: No Cyanosis: No Excoriation: No Ecchymosis: No Fluctuance: No Erythema: No Friable: No Hemosiderin Staining: No Induration: No Mottled: No Localized Edema: Yes Pallor: No Rash: No Rubor: No Scarring: No Temperature / Pain Moisture Temperature: No Abnormality No Abnormalities Noted: No Dry / Scaly: No Maceration: No Moist: Yes Wound Preparation Ulcer Cleansing: Rinsed/Irrigated with Saline Topical Anesthetic Applied: Other: lidocaine 4%, Treatment Notes Wound #3 (Right, Anterior Lower Leg) 1. Cleansed with: Clean wound with Normal Saline 4. Dressing Applied: Other dressing (specify in notes) 5. Secondary Dressing Applied Gauze and Kerlix/Conform 7. Secured with Recruitment consultant) Signed: 12/19/2015 9:18:54 AM By: Regan Lemming BSN, RN Entered By: Regan Lemming on 12/19/2015 09:18:54 [place], [name] ([id number]) -------------------------------------------------------------------------------- Vitals Details Patient Name: [name] Date of Service: 12/19/2015 8:45 AM Medical Record Number: [id number] Patient Account Number: [id number] Date of Birth/Sex: 12/01/1985 (29 y.o. Male) Treating RN: Baruch Gouty, RN, BSN, [place] Primary Care Physician: Milagros Evener Other Clinician: Referring Physician: Treating Physician/Extender: Frann Rider in Treatment: 0 Vital Signs Time Taken: 08:50 Temperature (F): 97.8 Height (in): 70 Pulse (bpm): 100 Source: Measured Respiratory Rate (breaths/min): 20 Weight (lbs): 250 Blood Pressure (mmHg): 141/86 Source: Stated Reference Range: 80 - 120 mg / dl Body Mass Index (BMI): 35.9 Electronic Signature(s) Signed: 12/19/2015 2:47:16 PM By: Regan Lemming BSN, RN Entered By: Regan Lemming on 12/19/2015 08:53:09

## 2015-12-19 NOTE — Progress Notes (Signed)
Dyal, CORMICK SHEFFIELD ([id number]) Visit Report for 12/19/2015 Abuse/Suicide Risk Screen Details Patient Name: [name], [name] Date of Service: 12/19/2015 8:45 AM Medical Record Number: [id number] Patient Account Number: [id number] Date of Birth/Sex: October 17, 1986 (29 y.o. Male) Treating RN: Baruch Gouty, RN, BSN, Velva Harman Primary Care Physician: Milagros Evener Other Clinician: Referring Physician: Treating Physician/Extender: Frann Rider in Treatment: 0 Abuse/Suicide Risk Screen Items Answer ABUSE/SUICIDE RISK SCREEN: Has anyone close to you tried to hurt or harm you recentlyo No Do you feel uncomfortable with anyone in your familyo No Has anyone forced you do things that you didnot want to doo No Do you have any thoughts of harming yourselfo No Patient displays signs or symptoms of abuse and/or neglect. No Electronic Signature(s) Signed: 12/19/2015 8:43:09 AM By: Regan Lemming BSN, RN Entered By: Regan Lemming on 12/19/2015 08:43:09 [name], [name] ([id number]) -------------------------------------------------------------------------------- Activities of Daily Living Details Patient Name: [name], [name] Date of Service: 12/19/2015 8:45 AM Medical Record Number: [id number] Patient Account Number: [id number] Date of Birth/Sex: March 16, 1986 (30 y.o. Male) Treating RN: Baruch Gouty, RN, BSN, Velva Harman Primary Care Physician: Milagros Evener Other Clinician: Referring Physician: Treating Physician/Extender: Frann Rider in Treatment: 0 Activities of Daily Living Items Answer Activities of Daily Living (Please select one for each item) Drive Automobile Completely Able Take Medications Completely Able Use Telephone Completely Able Care for Appearance Completely Able Use Toilet Completely Able Bath / Shower Completely Able Dress Self Completely Able Feed Self Completely Able Walk Completely Able Get In / Out Bed Completely [place] for Self Completely Able Electronic Signature(s) Signed: 12/19/2015 8:44:15 AM By: Regan Lemming BSN, RN Entered By: Regan Lemming on 12/19/2015 08:44:15 [name], [name] ([id number]) -------------------------------------------------------------------------------- Education Assessment Details Patient Name: [name] Date of Service: 12/19/2015 8:45 AM Medical Record Number: [id number] Patient Account Number: [id number] Date of Birth/Sex: [birth year] (29 y.o. Male) Treating RN: Baruch Gouty, RN, BSN, Velva Harman Primary Care Physician: Milagros Evener Other Clinician: Referring Physician: Treating Physician/Extender: Frann Rider in Treatment: 0 Primary Learner Assessed: Patient Learning Preferences/Education Level/Primary Language Learning Preference: Explanation Highest Education Level: High School Preferred Language: English Cognitive Barrier Assessment/Beliefs Language Barrier: No Physical Barrier Assessment Impaired Vision: No Impaired Hearing: No Decreased Hand dexterity: No Knowledge/Comprehension Assessment Knowledge Level: High Comprehension Level: High Ability to understand written High instructions: Ability to understand verbal High instructions: Motivation Assessment Anxiety Level: Calm Education Importance: Acknowledges Need Interest in Health Problems: Asks Questions Perception: Coherent Readiness to Engage in Self- High Management Activities: Electronic Signature(s) Signed: 12/19/2015 8:45:24 AM By: Regan Lemming BSN, RN Entered By: Regan Lemming on 12/19/2015 08:45:24 Card, [name] ([id number]) -------------------------------------------------------------------------------- Fall Risk Assessment Details Patient Name: [name] Date of Service: 12/19/2015 8:45 AM Medical Record Number: [id number] Patient Account Number: [id number] Date of Birth/Sex: Sep 03, 1986 (29 y.o. Male) Treating RN: Baruch Gouty, RN, BSN, [place] Primary Care Physician: Milagros Evener Other Clinician: Referring Physician: Treating Physician/Extender: Frann Rider in Treatment: 0 Fall Risk Assessment Items Have you had 2 or more falls in the last 12 monthso 0 No Have you had any fall that resulted in injury in the last 12 monthso 0 No FALL RISK ASSESSMENT: History of falling - immediate or within 3 months 0 No Secondary diagnosis 0 No Ambulatory aid None/bed rest/wheelchair/nurse 0 Yes Crutches/cane/walker 0 No Furniture 0 No IV Access/Saline Lock 0 No Gait/Training Normal/bed rest/immobile 0 No Weak 10 Yes Impaired 20 Yes Mental Status Oriented to own ability 0  Yes Electronic Signature(s) Signed: 12/19/2015 8:44:57 AM By: Regan Lemming BSN, RN Entered By: Regan Lemming on 12/19/2015 08:44:57 [name], [name] ([id number]) -------------------------------------------------------------------------------- Nutrition Risk Assessment Details Patient Name: [name] Date of Service: 12/19/2015 8:45 AM Medical Record Number: [id number] Patient Account Number: [id number] Date of Birth/Sex: [birth year] (29 y.o. Male) Treating RN: Baruch Gouty, RN, BSN, Velva Harman Primary Care Physician: Milagros Evener Other Clinician: Referring Physician: Treating Physician/Extender: Frann Rider in Treatment: 0 Height (in): Weight (lbs): Body Mass Index (BMI): Nutrition Risk Assessment Items NUTRITION RISK SCREEN: I have an illness or condition that made me change the kind and/or 0 No amount of food I eat I eat fewer than two meals per day 0 No I eat few fruits and vegetables, or milk products 0 No I have three or more drinks of beer, liquor or wine almost every day 0 No I have tooth or mouth problems that make it hard for me to eat 0 No I don't always have enough money to buy the food I need 0 No I eat alone most of the time 0 No I take three or more different prescribed or over-the-counter drugs a 0 No day Without wanting to, I have lost or gained 10 pounds in  the last six 0 No months I am not always physically able to shop, cook and/or feed myself 0 No Nutrition Protocols Good Risk Protocol 0 No interventions needed Moderate Risk Protocol Electronic Signature(s) Signed: 12/19/2015 8:44:34 AM By: Regan Lemming BSN, RN Entered By: Regan Lemming on 12/19/2015 08:44:34

## 2015-12-20 NOTE — Progress Notes (Signed)
Mckeel, ERAY COLLINSON ([id number]) Visit Report for 12/19/2015 Chief Complaint Document Details Patient Name: CRIBBMasaru, Haler Date of Service: 12/19/2015 8:45 AM Medical Record Number: [id number] Patient Account Number: [id number] Date of Birth/Sex: 10/22/1986 (29 y.o. Male) Treating RN: Baruch Gouty, RN, BSN, Velva Harman Primary Care Physician: Milagros Evener Other Clinician: Referring Physician: Treating Physician/Extender: Frann Rider in Treatment: 0 Information Obtained from: Patient Chief Complaint Patient is at the clinic for treatment of an open pressure ulcer well over a month. Electronic Signature(s) Signed: 12/19/2015 9:55:11 AM By: Christin Fudge MD, FACS Entered By: Christin Fudge on 12/19/2015 09:55:11 Brill, [name] ([id number]) -------------------------------------------------------------------------------- Debridement Details Patient Name: [name] Date of Service: 12/19/2015 8:45 AM Medical Record Number: [id number] Patient Account Number: [id number] Date of Birth/Sex: [birth year] (29 y.o. Male) Treating RN: Baruch Gouty, RN, BSN, [place] Primary Care Physician: Milagros Evener Other Clinician: Referring Physician: Treating Physician/Extender: Frann Rider in Treatment: 0 Debridement Performed for Wound #3 Right,Anterior Lower Leg Assessment: Performed By: Physician Christin Fudge, MD Debridement: Debridement Pre-procedure Yes Verification/Time Out Taken: Start Time: 09:33 Pain Control: Lidocaine 4% Topical Solution Level: Skin/Subcutaneous Tissue Total Area Debrided (L x 3 (cm) x 3 (cm) = 9 (cm) W): Tissue and other Viable, Non-Viable, Fibrin/Slough, Skin, Subcutaneous material debrided: Instrument: Forceps Bleeding: Minimum Hemostasis Achieved: Pressure End Time: 09:36 Procedural Pain: Insensate Post Procedural Pain: Insensate Response to Treatment: Procedure was tolerated well Post Debridement Measurements of Total Wound Length: (cm) 3 Width: (cm) 3 Depth:  (cm) 0.1 Volume: (cm) 0.707 Post Procedure Diagnosis Same as Pre-procedure Electronic Signature(s) Signed: 12/19/2015 9:54:52 AM By: Christin Fudge MD, FACS Signed: 12/19/2015 2:47:16 PM By: Regan Lemming BSN, RN Entered By: Christin Fudge on 12/19/2015 09:54:52 Spofford, [name] ([id number]) -------------------------------------------------------------------------------- HPI Details Patient Name: [name] Date of Service: 12/19/2015 8:45 AM Medical Record Number: [id number] Patient Account Number: [id number] Date of Birth/Sex: 26-Aug-1986 (29 y.o. Male) Treating RN: Baruch Gouty, RN, BSN, [place] Primary Care Physician: Milagros Evener Other Clinician: Referring Physician: Treating Physician/Extender: Frann Rider in Treatment: 0 History of Present Illness Location: right lower extremity near the shin and both ankles posteriorly Quality: Patient reports No Pain. Severity: Patient states wound are getting worse. Duration: Patient has had the wound for > 2 months prior to seeking treatment at the wound center Context: The wound appeared gradually over time Modifying Factors: Other treatment(s) tried include:wound care products which he's had from the past Associated Signs and Symptoms: Patient reports having foul odor. HPI Description: 30 year old paraplegic who has been sent to see as by his PCP Dr. Radene Ou. The patient has had wounds which he says have been there for well over a month and he has been treating it himself. 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 He is a former smoker and quit in 2013. Electronic Signature(s) Signed: 12/19/2015 9:56:32 AM By: Christin Fudge MD, FACS Previous Signature: 12/19/2015 9:16:01 AM Version By:  Christin Fudge MD, FACS Entered By: Christin Fudge on 12/19/2015 09:56:31 Isaacson, [name] ([id number]) -------------------------------------------------------------------------------- Physical Exam Details Patient Name: [name] Date of Service: 12/19/2015 8:45 AM Medical Record Number: [id number] Patient Account Number: [id number] Date of Birth/Sex: Apr 08, 1986 (29 y.o. Male) Treating RN: Baruch Gouty, RN, BSN, Velva Harman Primary Care Physician: Milagros Evener Other Clinician: Referring Physician: Treating Physician/Extender: Frann Rider in  Treatment: 0 Constitutional . Pulse regular. Respirations normal and unlabored. Afebrile. . Eyes Nonicteric. Reactive to light. Ears, Nose, Mouth, and Throat Lips, teeth, and gums WNL.Marland Kitchen Moist mucosa without lesions. Neck supple and nontender. No palpable supraclavicular or cervical adenopathy. Normal sized without goiter. Respiratory WNL. No retractions.. Breath sounds WNL, No rubs, rales, rhonchi, or wheeze.. Chest Breasts symmetical and no nipple discharge.. Breast tissue WNL, no masses, lumps, or tenderness.. Gastrointestinal (GI) Abdomen without masses or tenderness.. No liver or spleen enlargement or tenderness.. Lymphatic No adneopathy. No adenopathy. No adenopathy. Musculoskeletal Adexa without tenderness or enlargement.. Digits and nails w/o clubbing, cyanosis, infection, petechiae, ischemia, or inflammatory conditions.. Integumentary (Hair, Skin) No suspicious lesions. No crepitus or fluctuance. No peri-wound warmth or erythema. No masses.Marland Kitchen Psychiatric Judgement and insight Intact.. No evidence of depression, anxiety, or agitation.. Notes on the right lower extremity in the lateral calf area he's got a lacerated wound which has been there for quite a while and has significant amount of debris in the skin and subcutaneous tissue. Dissection was done with a toothed forcep down to the subcutis tissue and bleeding was controlled with  Silver nitrate Both the ankle wounds have hyper granulation tissue and if these don't respond to normal methods we may need to biopsy these. They have used silver nitrate to cauterize these wounds Electronic Signature(s) Signed: 12/19/2015 9:57:44 AM By: Christin Fudge MD, FACS Entered By: Christin Fudge on 12/19/2015 09:57:44 Cregger, [name] ([id number]) -------------------------------------------------------------------------------- Physician Orders Details Patient Name: [name] Date of Service: 12/19/2015 8:45 AM Medical Record Number: [id number] Patient Account Number: [id number] Date of Birth/Sex: December 08, 1985 (29 y.o. Male) Treating RN: Baruch Gouty, RN, BSN, Velva Harman Primary Care Physician: Milagros Evener Other Clinician: Referring Physician: Treating Physician/Extender: Frann Rider in Treatment: 0 Verbal / Phone Orders: Yes Clinician: Afful, RN, BSN, Rita Read Back and Verified: Yes Diagnosis Coding Wound Cleansing Wound #1 Left Achilles o Cleanse wound with mild soap and water o May Shower, gently pat wound dry prior to applying new dressing. Wound #2 Right Achilles o Cleanse wound with mild soap and water o May Shower, gently pat wound dry prior to applying new dressing. Wound #3 Right,Anterior Lower Leg o Cleanse wound with mild soap and water o May Shower, gently pat wound dry prior to applying new dressing. Anesthetic Wound #1 Left Achilles o Topical Lidocaine 4% cream applied to wound bed prior to debridement Wound #2 Right Achilles o Topical Lidocaine 4% cream applied to wound bed prior to debridement Wound #3 Right,Anterior Lower Leg o Topical Lidocaine 4% cream applied to wound bed prior to debridement Primary Wound Dressing Wound #1 Left Achilles o Other: - Siltec with hydrogel Wound #2 Right Achilles o Other: - Siltec with hydrogel Wound #3 Right,Anterior Lower Leg o Other: - Siltec with hydrogel Secondary Dressing Wound #1 Left  Achilles o Boardered Foam Dressing Danielson, Dwain E. ([id number]) Wound #2 Right Achilles o Boardered Foam Dressing Wound #3 Right,Anterior Lower Leg o Boardered Foam Dressing Dressing Change Frequency Wound #1 Left Achilles o Change dressing every other day. Wound #2 Right Achilles o Change dressing every other day. Wound #3 Right,Anterior Lower Leg o Change dressing every other day. Follow-up Appointments Wound #1 Left Achilles o Return Appointment in 1 week. Wound #2 Right Achilles o Return Appointment in 1 week. Wound #3 Right,Anterior Lower Leg o Return Appointment in 1 week. Additional Orders / Instructions Wound #1 Left Achilles o Increase protein intake. o Other: - Include vitamin C, Zinc and MVI in  your dailuy supplement Wound #2 Right Achilles o Increase protein intake. o Other: - Include vitamin C, Zinc and MVI in your dailuy supplement Wound #3 Right,Anterior Lower Leg o Increase protein intake. o Other: - Include vitamin C, Zinc and MVI in your dailuy supplement Electronic Signature(s) Signed: 12/19/2015 2:47:16 PM By: Regan Lemming BSN, RN Signed: 12/19/2015 4:48:22 PM By: Christin Fudge MD, FACS Entered By: Regan Lemming on 12/19/2015 09:42:55 Ferg, [name] ([id number]) -------------------------------------------------------------------------------- Problem List Details Patient Name: [name] Date of Service: 12/19/2015 8:45 AM Medical Record Number: [id number] Patient Account Number: [id number] Date of Birth/Sex: 12-12-1985 (29 y.o. 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 E66.01 Morbid (severe) obesity due to excess calories 12/19/2015 Yes Inactive Problems Resolved Problems Electronic Signature(s) Signed: 12/19/2015 9:54:35 AM By: Christin Fudge MD, FACS Previous Signature: 12/19/2015 9:54:25 AM Version By: Christin Fudge MD, FACS Entered By: Christin Fudge on 12/19/2015 09:54:34 Aslin, [name] ([id number]) -------------------------------------------------------------------------------- Progress Note Details Patient Name: [name] Date of Service: 12/19/2015 8:45 AM Medical Record Number: [id number] Patient Account Number: [id number] Date of Birth/Sex: [birth year] (29 y.o. Male) Treating RN: Baruch Gouty, RN, BSN, [place] Primary Care Physician: Milagros Evener Other Clinician: Referring Physician: Treating Physician/Extender: Frann Rider in Treatment: 0 Subjective Chief Complaint Information obtained from Patient Patient is at the clinic for treatment of an open pressure ulcer well over a month. History of Present Illness (HPI) The following HPI elements were documented for the patient's wound: Location: right lower extremity near the shin and both ankles posteriorly Quality: Patient reports No Pain. Severity: Patient states wound are getting worse. Duration: Patient has had the wound for > 2 months prior to seeking treatment at the wound center Context: The wound appeared gradually over time Modifying Factors: Other treatment(s) tried include:wound care products which he's had from the past Associated Signs and Symptoms: Patient reports having foul odor. 30 year old paraplegic who has been sent to see as by his PCP Dr. Radene Ou. The patient has had wounds which he says have been there for well over a month and he has been treating it himself. 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 He is a former smoker and quit in 2013. Wound History Patient presents with 3 open wounds that have been present for approximately month. Patient has been treating wounds in the following manner: alginate. Laboratory tests have not been performed in the last month. Patient reportedly has not tested positive for an antibiotic resistant organism. Patient reportedly has not tested positive for osteomyelitis. Patient reportedly has not had testing performed to evaluate circulation in the legs. Patient experiences the following problems associated with their wounds: infection, swelling. Patient History Information obtained from Patient, Chart. Allergies PCN, sulfa drugs, Levaquin, Zyvox, meropenem Family History Diabetes - Father, Hypertension - Father, Mother, Seizures - Siblings, Conerly, Treyon E. ([id number]) No family history of Cancer, Heart Disease, Hereditary Spherocytosis, Kidney Disease, Lung Disease,  Stroke, Thyroid Problems, Tuberculosis. Social History Former smoker - quit 2014, Alcohol Use - Rarely, Drug Use - No History, Caffeine Use - Daily - soda. Medical History Eyes Denies history of Cataracts, Glaucoma, Optic Neuritis Ear/Nose/Mouth/Throat Denies history of Chronic sinus problems/congestion, Middle ear problems Hematologic/Lymphatic Denies history of Anemia, Hemophilia, Human Immunodeficiency Virus, Lymphedema, Sickle Cell Disease Respiratory Patient has history of Sleep Apnea Denies history of Aspiration, Asthma, Chronic Obstructive Pulmonary Disease (COPD), Pneumothorax, Tuberculosis Cardiovascular Patient has history of Hypertension Gastrointestinal Denies history of Cirrhosis , Colitis, Crohn s, Hepatitis A, Hepatitis B, Hepatitis C Endocrine Denies history of  Type I Diabetes, Type II Diabetes Genitourinary Denies history of End Stage Renal Disease Immunological Denies history of Lupus Erythematosus, Raynaud s, Scleroderma Integumentary (Skin) Denies history of History of Burn, History of pressure wounds Musculoskeletal Denies history of Gout, Rheumatoid Arthritis, Osteoarthritis, Osteomyelitis Oncologic Denies history of Received Chemotherapy Review of Systems (ROS) Constitutional Symptoms (General Health) The patient has no complaints or symptoms. Eyes Complains or has symptoms of Glasses / Contacts. Ear/Nose/Mouth/Throat The patient has no complaints or symptoms. Hematologic/Lymphatic The patient has no complaints or symptoms. Respiratory The patient has no complaints or symptoms. Cardiovascular The patient has no complaints or symptoms. Gastrointestinal The patient has no complaints or symptoms. Endocrine The patient has no complaints or symptoms. Soberanes, Jaelan E. ([id number]) Genitourinary Complains or has symptoms of Incontinence/dribbling. Immunological Denies complaints or symptoms of Hives, Itching. Integumentary (Skin) Complains or has symptoms of Wounds, Breakdown, Swelling. Musculoskeletal Complains or has symptoms of Muscle Weakness. Neurologic Complains or has symptoms of Focal/Weakness. Oncologic The patient has no complaints or symptoms. Psychiatric depression Medications prostat oral unspecified oral for two lisinopril 20 mg-hydrochlorothiazide 25 mg tablet oral 1 1 tablet oral multivitamin tablet oral 1 1 tablet oral sertraline 50 mg tablet oral 1 1 tablet oral baclofen 20 mg tablet oral tablet oral imipramine 50 mg tablet oral 1 1 tablet oral Ditropan XL 15 mg tablet,extended release oral 2 2 tablet extended release 24hr oral Objective Constitutional Pulse regular. Respirations normal and unlabored. Afebrile. Vitals Time Taken: 8:50 AM, Height: 70 in, Source: Measured, Weight: 250 lbs, Source: Stated,  BMI: 35.9, Temperature: 97.8 F, Pulse: 100 bpm, Respiratory Rate: 20 breaths/min, Blood Pressure: 141/86 mmHg. Eyes Nonicteric. Reactive to light. Ears, Nose, Mouth, and Throat Lips, teeth, and gums WNL.Marland Kitchen Moist mucosa without lesions. Neck supple and nontender. No palpable supraclavicular or cervical adenopathy. Normal sized without goiter. Cai, Slater E. ([id number]) Respiratory WNL. No retractions.. Breath sounds WNL, No rubs, rales, rhonchi, or wheeze.. Chest Breasts symmetical and no nipple discharge.. Breast tissue WNL, no masses, lumps, or tenderness.. Gastrointestinal (GI) Abdomen without masses or tenderness.. No liver or spleen enlargement or tenderness.. Lymphatic No adneopathy. No adenopathy. No adenopathy. Musculoskeletal Adexa without tenderness or enlargement.. Digits and nails w/o clubbing, cyanosis, infection, petechiae, ischemia, or inflammatory conditions.Marland Kitchen Psychiatric Judgement and insight Intact.. No evidence of depression, anxiety, or agitation.. General Notes: on the right lower extremity in the lateral calf area he's got a lacerated wound which has been there for quite a while and has significant amount of debris in the skin and subcutaneous tissue. Dissection was done with a toothed forcep down to the subcutis tissue and bleeding was controlled with Silver nitrate Both the ankle wounds have hyper granulation tissue and if these don't respond to normal methods we may need to biopsy these. They have used silver nitrate to cauterize these wounds Integumentary (Hair, Skin) No suspicious lesions. No crepitus or fluctuance. No peri-wound  warmth or erythema. No masses.. Wound #1 status is Open. Original cause of wound was Gradually Appeared. The wound is located on the Left Achilles. The wound measures 3cm length x 3cm width x 0.1cm depth; 7.069cm^2 area and 0.707cm^3 volume. The wound is limited to skin breakdown. There is no tunneling or undermining noted. There is  a medium amount of serosanguineous drainage noted. The wound margin is distinct with the outline attached to the wound base. There is large (67-100%) red, pink granulation within the wound bed. There is no necrotic tissue within the wound bed. The periwound skin appearance exhibited: Localized Edema, Moist. The periwound skin appearance did not exhibit: Callus, Crepitus, Excoriation, Fluctuance, Friable, Induration, Rash, Scarring, Dry/Scaly, Maceration, Atrophie Blanche, Cyanosis, Ecchymosis, Hemosiderin Staining, Mottled, Pallor, Rubor, Erythema. Periwound temperature was noted as No Abnormality. Wound #2 status is Open. Original cause of wound was Gradually Appeared. The wound is located on the Right Achilles. The wound measures 1cm length x 1cm width x 0.1cm depth; 0.785cm^2 area and 0.079cm^3 volume. The wound is limited to skin breakdown. There is no tunneling or undermining noted. There is a medium amount of serosanguineous drainage noted. The wound margin is distinct with the outline attached to the wound base. There is large (67-100%) red, pink granulation within the wound bed. The periwound skin appearance exhibited: Localized Edema, Moist. The periwound skin appearance did not exhibit: Callus, Crepitus, Excoriation, Fluctuance, Friable, Induration, Rash, Scarring, Dry/Scaly, Maceration, Atrophie Blanche, Cyanosis, Ecchymosis, Hemosiderin Staining, Mottled, Pallor, Rubor, Erythema. Periwound temperature was noted as No Abnormality. Wound #3 status is Open. Original cause of wound was Gradually Appeared. The wound is located on the Right,Anterior Lower Leg. The wound measures 3cm length x 3cm width x 0.1cm depth; 7.069cm^2 area Poliquin, Jaquille E. ([id number]) and 0.707cm^3 volume. The wound is limited to skin breakdown. There is no tunneling or undermining noted. There is a medium amount of serosanguineous drainage noted. The wound margin is distinct with the outline attached to the wound  base. There is large (67-100%) granulation within the wound bed. There is no necrotic tissue within the wound bed. The periwound skin appearance exhibited: Localized Edema, Moist. The periwound skin appearance did not exhibit: Callus, Crepitus, Excoriation, Fluctuance, Friable, Induration, Rash, Scarring, Dry/Scaly, Maceration, Atrophie Blanche, Cyanosis, Ecchymosis, Hemosiderin Staining, Mottled, Pallor, Rubor, Erythema. Periwound temperature was noted as No Abnormality. Assessment Active Problems ICD-10 G82.21 - Paraplegia, complete L89.513 - Pressure ulcer of right ankle, stage 3 L89.523 - Pressure ulcer of left ankle, stage 3 L97.212 - Non-pressure chronic ulcer of right calf with fat layer exposed E66.01 - Morbid (severe) obesity due to excess calories 30 year old gentleman who's had paraplegia since 2005 has had these wounds for a while and has been treating them himself. Have recommended: 1. Sorbact with hydrogel to be applied to the wounds this week and we will order him some RTD to be delivered to his home and he will bring this back to me next week. 2. appropriate offloading has been discussed and he understands the importance of this. 3. He does not have a low pressure air mattress and on trying to order this for him he declines because of his living conditions. 4. Besides nutrition careful control of his proteins and vitamin C and zinc have been recommended. He will come back and see as at regular intervals. Procedures Wound #3 Wound #3 is a To be determined located on the Right,Anterior Lower Leg . There was a Skin/Subcutaneous Tissue Debridement [id number]) debridement with total area of  9 sq cm performed by Christin Fudge, MD. with the following instrument(s): Forceps to remove Viable and Non-Viable tissue/material including Fibrin/Slough, Skin, and Subcutaneous after achieving pain control using Lidocaine 4% Topical Solution. A time out was conducted prior to the start  of the procedure. A Minimum amount of bleeding was controlled Garlow, Jorge E. ([id number]) with Pressure. The procedure was tolerated well with a pain level of Insensate throughout and a pain level of Insensate following the procedure. Post Debridement Measurements: 3cm length x 3cm width x 0.1cm depth; 0.707cm^3 volume. Post procedure Diagnosis Wound #3: Same as Pre-Procedure Plan Wound Cleansing: Wound #1 Left Achilles: Cleanse wound with mild soap and water May Shower, gently pat wound dry prior to applying new dressing. Wound #2 Right Achilles: Cleanse wound with mild soap and water May Shower, gently pat wound dry prior to applying new dressing. Wound #3 Right,Anterior Lower Leg: Cleanse wound with mild soap and water May Shower, gently pat wound dry prior to applying new dressing. Anesthetic: Wound #1 Left Achilles: Topical Lidocaine 4% cream applied to wound bed prior to debridement Wound #2 Right Achilles: Topical Lidocaine 4% cream applied to wound bed prior to debridement Wound #3 Right,Anterior Lower Leg: Topical Lidocaine 4% cream applied to wound bed prior to debridement Primary Wound Dressing: Wound #1 Left Achilles: Other: - Siltec with hydrogel Wound #2 Right Achilles: Other: - Siltec with hydrogel Wound #3 Right,Anterior Lower Leg: Other: - Siltec with hydrogel Secondary Dressing: Wound #1 Left Achilles: Boardered Foam Dressing Wound #2 Right Achilles: Boardered Foam Dressing Wound #3 Right,Anterior Lower Leg: Boardered Foam Dressing Dressing Change Frequency: Wound #1 Left Achilles: Change dressing every other day. Wound #2 Right Achilles: Change dressing every other day. Wound #3 Right,Anterior Lower Leg: Change dressing every other day. Traynham, Brien E. ([id number]) Follow-up Appointments: Wound #1 Left Achilles: Return Appointment in 1 week. Wound #2 Right Achilles: Return Appointment in 1 week. Wound #3 Right,Anterior Lower Leg: Return Appointment  in 1 week. Additional Orders / Instructions: Wound #1 Left Achilles: Increase protein intake. Other: - Include vitamin C, Zinc and MVI in your dailuy supplement Wound #2 Right Achilles: Increase protein intake. Other: - Include vitamin C, Zinc and MVI in your dailuy supplement Wound #3 Right,Anterior Lower Leg: Increase protein intake. Other: - Include vitamin C, Zinc and MVI in your dailuy supplement 30 year old gentleman who's had paraplegia since 2005 has had these wounds for a while and has been treating them himself. Have recommended: 1. Sorbact with hydrogel to be applied to the wounds this week and we will order him some RTD to be delivered to his home and he will bring this back to me next week. 2. appropriate offloading has been discussed and he understands the importance of this. 3. He does not have a low pressure air mattress and on trying to order this for him he declines because of his living conditions. 4. Besides nutrition careful control of his proteins and vitamin C and zinc have been recommended. He will come back and see as at regular intervals. Electronic Signature(s) Signed: 12/19/2015 9:59:55 AM By: Christin Fudge MD, FACS Entered By: Christin Fudge on 12/19/2015 09:59:55 Fed[place], [name] ([id number]) -------------------------------------------------------------------------------- ROS/PFSH Details Patient Name: [name] Date of Service: 12/19/2015 8:45 AM Medical Record Number: [id number] Patient Account Number: [id number] Date of Birth/Sex: [birth year] (29 y.o. Male) Treating RN: Baruch Gouty, RN, BSN, Velva Harman Primary Care Physician: Milagros Evener Other Clinician: Referring Physician: Treating Physician/Extender: Frann Rider in Treatment: 0 Information Obtained From Patient  Chart Wound History Do you currently have one or more open woundso Yes How many open wounds do you currently haveo 3 Approximately how long have you had your woundso month How have  you been treating your wound(s) until nowo alginate Has your wound(s) ever healed and then re-openedo No Have you had any lab work done in the past montho No Have you tested positive for an antibiotic resistant organism (MRSA, VRE)o No Have you tested positive for osteomyelitis (bone infection)o No Have you had any tests for circulation on your legso No Have you had other problems associated with your woundso Infection, Swelling Eyes Complaints and Symptoms: Positive for: Glasses / Contacts Medical History: Negative for: Cataracts; Glaucoma; Optic Neuritis Genitourinary Complaints and Symptoms: Positive for: Incontinence/dribbling Medical History: Negative for: End Stage Renal Disease Immunological Complaints and Symptoms: Negative for: Hives; Itching Medical History: Negative for: Lupus Erythematosus; Raynaudos; Scleroderma Integumentary (Skin) Complaints and Symptoms: Positive for: Wounds; Breakdown; Swelling Cressey, Renji E. ([id number]) Medical History: Negative for: History of Burn; History of pressure wounds Musculoskeletal Complaints and Symptoms: Positive for: Muscle Weakness Medical History: Negative for: Gout; Rheumatoid Arthritis; Osteoarthritis; Osteomyelitis Neurologic Complaints and Symptoms: Positive for: Focal/Weakness Constitutional Symptoms (General Health) Complaints and Symptoms: No Complaints or Symptoms Ear/Nose/Mouth/Throat Complaints and Symptoms: No Complaints or Symptoms Medical History: Negative for: Chronic sinus problems/congestion; Middle ear problems Hematologic/Lymphatic Complaints and Symptoms: No Complaints or Symptoms Medical History: Negative for: Anemia; Hemophilia; Human Immunodeficiency Virus; Lymphedema; Sickle Cell Disease Respiratory Complaints and Symptoms: No Complaints or Symptoms Medical History: Positive for: Sleep Apnea Negative for: Aspiration; Asthma; Chronic Obstructive Pulmonary Disease (COPD);  Pneumothorax; Tuberculosis Cardiovascular Complaints and Symptoms: No Complaints or Symptoms Highbaugh, Adyn E. ([id number]) Medical History: Positive for: Hypertension Gastrointestinal Complaints and Symptoms: No Complaints or Symptoms Medical History: Negative for: Cirrhosis ; Colitis; Crohnos; Hepatitis A; Hepatitis B; Hepatitis C Endocrine Complaints and Symptoms: No Complaints or Symptoms Medical History: Negative for: Type I Diabetes; Type II Diabetes Oncologic Complaints and Symptoms: No Complaints or Symptoms Medical History: Negative for: Received Chemotherapy Psychiatric Complaints and Symptoms: Review of System Notes: depression Family and Social History Cancer: No; Diabetes: Yes - Father; Heart Disease: No; Hereditary Spherocytosis: No; Hypertension: Yes - Father, Mother; Kidney Disease: No; Lung Disease: No; Seizures: Yes - Siblings; Stroke: No; Thyroid Problems: No; Tuberculosis: No; Former smoker - quit 2014; Alcohol Use: Rarely; Drug Use: No History; Caffeine Use: Daily - soda; Financial Concerns: No; Food, Clothing or Shelter Needs: No; Support System Lacking: No; Transportation Concerns: No; Advanced Directives: No; Patient does not want information on Advanced Directives; Living Will: No Physician Affirmation I have reviewed and agree with the above information. Electronic Signature(s) Signed: 12/19/2015 9:11:14 AM By: Christin Fudge MD, FACS Signed: 12/19/2015 2:47:16 PM By: Regan Lemming BSN, RN Entered By: Christin Fudge on 12/19/2015 09:11:14 Sattar, EZYKIEL LYNDE ([id number]) Vanderlinde, [name] ([id number]) -------------------------------------------------------------------------------- SuperBill Details Patient Name: [name] Date of Service: 12/19/2015 Medical Record Number: [id number] Patient Account Number: [id number] Date of Birth/Sex: 27-Jun-1986 (29 y.o. 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, stage 3 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, stage 3 L97.212 Non-pressure chronic ulcer of right calf with fat layer exposed E66.01 Morbid (severe) obesity due to excess calories Facility Procedures CPT4 Code: [id number] Description: 99213 - WOUND CARE VISIT-LEV 3 EST PT Modifier:  Quantity: 1 CPT4 Code: [id number] Description: [id number] - DEB SUBQ TISSUE 20 SQ CM/< ICD-10 Description Diagnosis L97.212 Non-pressure chronic ulcer of right calf with fat 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, stage 3 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, stage 3 Modifier: layer exposed Quantity: 1 Physician Procedures CPT4 Code: [id number] Description: [id number] - WC PHYS LEVEL 4 - NEW PT ICD-10 Description Diagnosis 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 ([id number]) Modifier: layer exposed Quantity: 1 Electronic Signature(s) Signed: 12/19/2015 10:00:25 AM By: Christin Fudge MD, FACS Entered By: Christin Fudge on 12/19/2015 10:00:25

## 2015-12-26 ENCOUNTER — Encounter: Payer: BLUE CROSS/BLUE SHIELD | Attending: Surgery | Admitting: Surgery

## 2015-12-26 DIAGNOSIS — L89513 Pressure ulcer of right ankle, stage 3: Secondary | ICD-10-CM | POA: Diagnosis not present

## 2015-12-26 DIAGNOSIS — L97212 Non-pressure chronic ulcer of right calf with fat layer exposed: Secondary | ICD-10-CM | POA: Diagnosis not present

## 2015-12-26 DIAGNOSIS — Z9889 Other specified postprocedural states: Secondary | ICD-10-CM | POA: Diagnosis not present

## 2015-12-26 DIAGNOSIS — Z6835 Body mass index (BMI) 35.0-35.9, adult: Secondary | ICD-10-CM | POA: Diagnosis not present

## 2015-12-26 DIAGNOSIS — L89523 Pressure ulcer of left ankle, stage 3: Secondary | ICD-10-CM | POA: Insufficient documentation

## 2015-12-26 DIAGNOSIS — G4730 Sleep apnea, unspecified: Secondary | ICD-10-CM | POA: Diagnosis not present

## 2015-12-26 DIAGNOSIS — Z87891 Personal history of nicotine dependence: Secondary | ICD-10-CM | POA: Insufficient documentation

## 2015-12-26 DIAGNOSIS — G82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, complete: Secondary | ICD-10-CM | POA: Diagnosis present

## 2015-12-26 DIAGNOSIS — Z9081 Acquired absence of spleen: Secondary | ICD-10-CM | POA: Insufficient documentation

## 2015-12-26 DIAGNOSIS — R32 Unspecified urinary incontinence: Secondary | ICD-10-CM | POA: Insufficient documentation

## 2015-12-26 DIAGNOSIS — I10 Essential (primary) hypertension: Secondary | ICD-10-CM | POA: Diagnosis not present

## 2015-12-26 DIAGNOSIS — F329 Major depressive disorder, single episode, unspecified: Secondary | ICD-10-CM | POA: Diagnosis not present

## 2015-12-28 NOTE — Progress Notes (Signed)
Ellenbecker, DANG CORPUS ([id number]) Visit Report for 12/26/2015 Chief Complaint Document Details Patient Name: [name], [name] Date of Service: 12/26/2015 8:00 AM Medical Record Number: [id number] Patient Account Number: [id number] Date of Birth/Sex: August 14, 1986 (30 y.o. Male) Treating RN: Montey Hora Primary Care Physician: Milagros Evener Other Clinician: Referring Physician: Milagros Evener Treating Physician/Extender: Frann Rider in Treatment: 1 Information Obtained from: Patient Chief Complaint Patient is at the clinic for treatment of an open pressure ulcer well over a month. Electronic Signature(s) Signed: 12/26/2015 9:00:06 AM By: Christin Fudge MD, FACS Entered By: Christin Fudge on 12/26/2015 09:00:05 Ottaviano, [name] ([id number]) -------------------------------------------------------------------------------- [name] Date of Service: 12/26/2015 8:00 AM Medical Record Number: [id number] Patient Account Number: [id number] Date of Birth/Sex: [birth year] (30 y.o. Male) Treating RN: Montey Hora Primary Care Physician: Milagros Evener Other Clinician: Referring Physician: Milagros Evener Treating Physician/Extender: Frann Rider in Treatment: 1 Debridement Performed for Wound #1 Left Achilles Assessment: Performed By: Physician Christin Fudge, MD Debridement: Debridement Pre-procedure Yes Verification/Time Out Taken: Start Time: 08:41 Pain Control: Lidocaine 4% Topical Solution Level: Skin/Subcutaneous Tissue Total Area Debrided (L x 1.5 (cm) x 3.5 (cm) = 5.25 (cm) W): Tissue and other Viable, Non-Viable, Eschar, Fibrin/Slough, Subcutaneous material debrided: Instrument: Curette Bleeding: Minimum Hemostasis Achieved: Silver Nitrate End Time: 08:43 Procedural Pain: 0 Post Procedural Pain: 0 Response to Treatment: Procedure was tolerated well Post Debridement Measurements of Total Wound Length: (cm) 1.5 Width: (cm)  3.5 Depth: (cm) 0.1 Volume: (cm) 0.412 Post Procedure Diagnosis Same as Pre-procedure Electronic Signature(s) Signed: 12/26/2015 8:59:43 AM By: Christin Fudge MD, FACS Signed: 12/26/2015 5:19:21 PM By: Montey Hora Entered By: Christin Fudge on 12/26/2015 08:59:43 Scheidegger, [name] ([id number]) -------------------------------------------------------------------------------- [name] Date of Service: 12/26/2015 8:00 AM Medical Record Number: [id number] Patient Account Number: [id number] Date of Birth/Sex: [birth year] (30 y.o. Male) Treating RN: Montey Hora Primary Care Physician: Milagros Evener Other Clinician: Referring Physician: Milagros Evener Treating Physician/Extender: Frann Rider in Treatment: 1 Debridement Performed for Wound #3 Right,Anterior Lower Leg Assessment: Performed By: Physician Christin Fudge, MD Debridement: Debridement Pre-procedure Yes Verification/Time Out Taken: Start Time: 08:36 Pain Control: Lidocaine 4% Topical Solution Level: Skin/Subcutaneous Tissue Total Area Debrided (L x 1.2 (cm) x 2 (cm) = 2.4 (cm) W): Tissue and other Viable, Non-Viable, Eschar, Fibrin/Slough, Subcutaneous material debrided: Instrument: Curette Bleeding: Minimum Hemostasis Achieved: Pressure End Time: 08:38 Procedural Pain: 0 Post Procedural Pain: 0 Response to Treatment: Procedure was tolerated well Post Debridement Measurements of Total Wound Length: (cm) 1.2 Width: (cm) 2 Depth: (cm) 0.1 Volume: (cm) 0.188 Post Procedure Diagnosis Same as Pre-procedure Electronic Signature(s) Signed: 12/26/2015 8:59:50 AM By: Christin Fudge MD, FACS Signed: 12/26/2015 5:19:21 PM By: Montey Hora Entered By: Christin Fudge on 12/26/2015 08:59:49 Kirkwood, [name] ([id number]) -------------------------------------------------------------------------------- [name] Date of Service: 12/26/2015 8:00  AM Medical Record Number: [id number] Patient Account Number: [id number] Date of Birth/Sex: [birth year] (30 y.o. Male) Treating RN: Montey Hora Primary Care Physician: Milagros Evener Other Clinician: Referring Physician: Milagros Evener Treating Physician/Extender: Frann Rider in Treatment: 1 Debridement Performed for Wound #4 Left,Lateral Lower Leg Assessment: Performed By: Physician Christin Fudge, MD Debridement: Debridement Pre-procedure Yes Verification/Time Out Taken: Start Time: 08:38 Pain Control: Lidocaine 4% Topical Solution Level: Skin/Subcutaneous Tissue Total Area Debrided (L x 4.4 (cm) x 2.5 (cm) = 11 (cm) W): Tissue and other Viable, Non-Viable, Eschar, Fibrin/Slough, Subcutaneous material debrided: Instrument: Curette Bleeding: Minimum Hemostasis Achieved: Pressure End Time:  08:41 Procedural Pain: 0 Post Procedural Pain: 0 Response to Treatment: Procedure was tolerated well Post Debridement Measurements of Total Wound Length: (cm) 4.4 Stage: Category/Stage IV Width: (cm) 2.5 Depth: (cm) 0.2 Volume: (cm) 1.728 Post Procedure Diagnosis Same as Pre-procedure Electronic Signature(s) Signed: 12/26/2015 8:59:57 AM By: Christin Fudge MD, FACS Signed: 12/26/2015 5:19:21 PM By: Montey Hora Entered By: Christin Fudge on 12/26/2015 08:59:57 Spike, [name] ([id number]) -------------------------------------------------------------------------------- [name] Date of Service: 12/26/2015 8:00 AM Medical Record Number: [id number] Patient Account Number: [id number] Date of Birth/Sex: 1986-01-28 (30 y.o. Male) Treating RN: Montey Hora Primary Care Physician: Milagros Evener Other Clinician: Referring Physician: Milagros Evener Treating Physician/Extender: Frann Rider in Treatment: 1 History of Present Illness Location: right lower extremity near the shin and both ankles posteriorly Quality: Patient reports No  Pain. Severity: Patient states wound are getting worse. Duration: Patient has had the wound for > 2 months prior to seeking treatment at the wound center Context: The wound appeared gradually over time Modifying Factors: Other treatment(s) tried include:wound care products which he's had from the past Associated Signs and Symptoms: Patient reports having foul odor. HPI Description: 30 year old paraplegic who has been sent to see as by his PCP Dr. Radene Ou. The patient has had wounds which he says have been there for well over a month and he has been treating it himself. 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 He is a former smoker and quit in 2013. 12/26/2015 -- he has not received his supplies last week and did not bring his RTD with him today. his wife who helps him with his dressings has been doing the required care Electronic Signature(s) Signed: 12/26/2015 9:00:52 AM By: Christin Fudge MD, FACS Entered By: Christin Fudge on 12/26/2015 09:00:52 Noack, [name] ([id number]) -------------------------------------------------------------------------------- Physical Exam Details Patient Name: [name] Date of Service: 12/26/2015 8:00 AM Medical Record Number: [id number] Patient Account Number: [id number] Date of Birth/Sex: 09-Jul-1986 (30 y.o. Male) Treating RN: Montey Hora Primary Care Physician: Milagros Evener Other Clinician: Referring Physician: Milagros Evener Treating Physician/Extender: Frann Rider in Treatment: 1 Constitutional . Pulse regular. Respirations normal and unlabored. Afebrile. . Eyes Nonicteric. Reactive to light. Ears, Nose, Mouth, and Throat Lips, teeth, and gums WNL.Marland Kitchen Moist mucosa without  lesions. Neck supple and nontender. No palpable supraclavicular or cervical adenopathy. Normal sized without goiter. Respiratory WNL. No retractions.. Cardiovascular Pedal Pulses WNL. No clubbing, cyanosis or edema. Genitourinary (GU) No hydrocele, spermatocele, tenderness of the cord, or testicular mass.Marland Kitchen Penis without lesions.Lowella Fairy without lesions. No cystocele, or rectocele. Pelvic support intact, no discharge.Marland Kitchen Urethra without masses, tenderness or scarring.Marland Kitchen Lymphatic No adneopathy. No adenopathy. No adenopathy. Musculoskeletal Adexa without tenderness or enlargement.. Digits and nails w/o clubbing, cyanosis, infection, petechiae, ischemia, or inflammatory conditions.. Integumentary (Hair, Skin) No suspicious lesions. No crepitus or fluctuance. No peri-wound warmth or erythema. No masses.Marland Kitchen Psychiatric Judgement and insight Intact.. No evidence of depression, anxiety, or agitation.. Notes The right lower extremity lateral wound is looking much better after sharp debridement is done with a curette and all the surrounding debris and some of the subcutaneous tissue was cleaned out. Hemostasis was secured with Silver nitrate. The one on the right ankle is looking much better and the hyper granulation tissue has been  controlled. The left ankle hyper granulation tissue still significant and I have used silver nitrate on this. The wound on the left lateral leg has subcutaneous debris which was sharply dissected with a curette and bleeding controlled with Silver nitrate stick. Electronic Signature(s) Signed: 12/26/2015 9:02:46 AM By: Christin Fudge MD, FACS Toro, [name] ([id number]) Entered By: Christin Fudge on 12/26/2015 09:02:46 Nesler, [name] ([id number]) -------------------------------------------------------------------------------- Physician Orders Details Patient Name: [name] Date of Service: 12/26/2015 8:00 AM Medical Record Number: [id number] Patient Account Number:  [id number] Date of Birth/Sex: Apr 20, 1986 (30 y.o. Male) Treating RN: Montey Hora Primary Care Physician: Milagros Evener Other Clinician: Referring Physician: Milagros Evener Treating Physician/Extender: Frann Rider in Treatment: 1 Verbal / Phone Orders: Yes Clinician: Montey Hora Read Back and Verified: Yes Diagnosis Coding Wound Cleansing Wound #1 Left Achilles o Cleanse wound with mild soap and water o May Shower, gently pat wound dry prior to applying new dressing. Wound #2 Right Achilles o Cleanse wound with mild soap and water o May Shower, gently pat wound dry prior to applying new dressing. Wound #3 Right,Anterior Lower Leg o Cleanse wound with mild soap and water o May Shower, gently pat wound dry prior to applying new dressing. Wound #4 Left,Lateral Lower Leg o Cleanse wound with mild soap and water o May Shower, gently pat wound dry prior to applying new dressing. Anesthetic Wound #1 Left Achilles o Topical Lidocaine 4% cream applied to wound bed prior to debridement Wound #2 Right Achilles o Topical Lidocaine 4% cream applied to wound bed prior to debridement Wound #3 Right,Anterior Lower Leg o Topical Lidocaine 4% cream applied to wound bed prior to debridement Wound #4 Left,Lateral Lower Leg o Topical Lidocaine 4% cream applied to wound bed prior to debridement Primary Wound Dressing Wound #1 Left Achilles o Other: - Siltec with hydrogel, order RTD Wound #2 Right Achilles o Other: - Siltec with hydrogel, order RTD Ketchum, [place] E. ([id number]) Wound #3 Right,Anterior Lower Leg o Other: - Siltec with hydrogel, order RTD Wound #4 Left,Lateral Lower Leg o Other: - Siltec with hydrogel, order RTD Secondary Dressing Wound #1 Left Achilles o Boardered Foam Dressing Wound #2 Right Achilles o Boardered Foam Dressing Wound #3 Right,Anterior Lower Leg o Boardered Foam Dressing Wound #4 Left,Lateral Lower  Leg o Boardered Foam Dressing Dressing Change Frequency Wound #1 Left Achilles o Change dressing every other day. Wound #2 Right Achilles o Change dressing every other day. Wound #3 Right,Anterior Lower Leg o Change dressing every other day. Wound #4 Left,Lateral Lower Leg o Change dressing every other day. Follow-up Appointments Wound #1 Left Achilles o Return Appointment in 1 week. Wound #2 Right Achilles o Return Appointment in 1 week. Wound #3 Right,Anterior Lower Leg o Return Appointment in 1 week. Wound #4 Left,Lateral Lower Leg o Return Appointment in 1 week. Edema Control Wound #1 Left Achilles Demirjian, Braxley E. ([id number]) o Other: - gauze and kerlix wrap from toes to 3cm below the knee Wound #2 Right Achilles o Other: - gauze and kerlix wrap from toes to 3cm below the knee Wound #3 Right,Anterior Lower Leg o Other: - gauze and kerlix wrap from toes to 3cm below the knee Wound #4 Left,Lateral Lower Leg o Other: - gauze and kerlix wrap from toes to 3cm below the knee Additional Orders / Instructions Wound #1 Left Achilles o Increase protein intake. o Other: - Include vitamin C, Zinc and MVI in your dailuy supplement Wound #2 Right Achilles o Increase protein intake. o  Other: - Include vitamin C, Zinc and MVI in your dailuy supplement Wound #3 Right,Anterior Lower Leg o Increase protein intake. o Other: - Include vitamin C, Zinc and MVI in your dailuy supplement Wound #4 Left,Lateral Lower Leg o Increase protein intake. o Other: - Include vitamin C, Zinc and MVI in your dailuy supplement Electronic Signature(s) Signed: 12/26/2015 4:33:45 PM By: Christin Fudge MD, FACS Signed: 12/26/2015 5:19:21 PM By: Montey Hora Entered By: Montey Hora on 12/26/2015 08:45:05 Peregrina, [name] ([id number]) -------------------------------------------------------------------------------- Problem List Details Patient Name: [name] Date  of Service: 12/26/2015 8:00 AM Medical Record Number: [id number] Patient Account Number: [id number] Date of Birth/Sex: [birth year] (30 y.o. 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 Male) Treating RN: Montey Hora Primary Care Physician: Milagros Evener Other Clinician: Referring Physician: Milagros Evener Treating Physician/Extender: Frann Rider in Treatment: 1 Subjective Chief Complaint Information obtained from Patient Patient is at the clinic for treatment of an open pressure ulcer well over a month. History of Present Illness (HPI) The following HPI elements were documented for the patient's wound: Location: right lower extremity near the shin and both ankles posteriorly Quality: Patient reports No Pain. Severity: Patient states wound are getting worse. Duration: Patient has had the wound for > 2 months prior to seeking treatment at the wound center Context: The wound  appeared gradually over time Modifying Factors: Other treatment(s) tried include:wound care products which he's had from the past Associated Signs and Symptoms: Patient reports having foul odor. 30 year old paraplegic who has been sent to see as by his PCP Dr. Radene Ou. The patient has had wounds which he says have been there for well over a month and he has been treating it himself. 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 He is a former smoker and quit in 2013. 12/26/2015 -- he has not received his supplies last week and did not bring his RTD with him today. his wife who helps him with his dressings has been doing the required care Objective Constitutional Pulse regular. Respirations normal and unlabored. Afebrile. Vitals Time Taken: 8:05 AM, Height: 70 in, Weight: 250 lbs, BMI: 35.9, Temperature: 97.9 F, Pulse: 133 bpm, Respiratory Rate: 20 breaths/min, Blood Pressure: 138/79 mmHg. Eyes Yanda, Colbie E. ([id number]) Nonicteric. Reactive to light. Ears, Nose, Mouth, and Throat Lips, teeth, and gums WNL.Marland Kitchen Moist mucosa without lesions. Neck supple and nontender. No palpable supraclavicular or cervical adenopathy. Normal sized without goiter. Respiratory WNL. No retractions.. Cardiovascular Pedal Pulses WNL. No clubbing, cyanosis or edema. Genitourinary (GU) No hydrocele, spermatocele,  tenderness of the cord, or testicular mass.Marland Kitchen Penis without lesions.Lowella Fairy without lesions. No cystocele, or rectocele. Pelvic support intact, no discharge.Marland Kitchen Urethra without masses, tenderness or scarring.Marland Kitchen Lymphatic No adneopathy. No adenopathy. No adenopathy. Musculoskeletal Adexa without tenderness or enlargement.. Digits and nails w/o clubbing,  cyanosis, infection, petechiae, ischemia, or inflammatory conditions.Marland Kitchen Psychiatric Judgement and insight Intact.. No evidence of depression, anxiety, or agitation.. General Notes: The right lower extremity lateral wound is looking much better after sharp debridement is done with a curette and all the surrounding debris and some of the subcutaneous tissue was cleaned out. Hemostasis was secured with Silver nitrate. The one on the right ankle is looking much better and the hyper granulation tissue has been controlled. The left ankle hyper granulation tissue still significant and I have used silver nitrate on this. The wound on the left lateral leg has subcutaneous debris which was sharply dissected with a curette and bleeding controlled with Silver nitrate stick. Integumentary (Hair, Skin) No suspicious lesions. No crepitus or fluctuance. No peri-wound warmth or erythema. No masses.. Wound #1 status is Open. Original cause of wound was Gradually Appeared. The wound is located on the Left Achilles. The wound measures 1.5cm length x 3.5cm width x 0.1cm depth; 4.123cm^2 area and 0.412cm^3 volume. The wound is limited to skin breakdown. There is no tunneling or undermining noted. There is a medium amount of serosanguineous drainage noted. The wound margin is distinct with the outline attached to the wound base. There is large (67-100%) red, pink granulation within the wound bed. There is no necrotic tissue within the wound bed. The periwound skin appearance exhibited: Localized Edema, Moist. The periwound skin appearance did not exhibit: Callus, Crepitus, Excoriation, Fluctuance, Friable, Induration, Rash, Scarring, Dry/Scaly, Maceration, Atrophie Blanche, Cyanosis, Ecchymosis, Hemosiderin Staining, Mottled, Pallor, Rubor, Erythema. Periwound temperature was noted as No Abnormality. Taubman, Dolph E. ([id number]) Wound #2 status is Open. Original cause of wound was Gradually Appeared. The wound is  located on the Right Achilles. The wound measures 0.5cm length x 0.6cm width x 0.1cm depth; 0.236cm^2 area and 0.024cm^3 volume. The wound is limited to skin breakdown. There is no tunneling or undermining noted. There is a medium amount of serosanguineous drainage noted. The wound margin is distinct with the outline attached to the wound base. There is large (67-100%) red, pink granulation within the wound bed. The periwound skin appearance exhibited: Localized Edema, Moist. The periwound skin appearance did not exhibit: Callus, Crepitus, Excoriation, Fluctuance, Friable, Induration, Rash, Scarring, Dry/Scaly, Maceration, Atrophie Blanche, Cyanosis, Ecchymosis, Hemosiderin Staining, Mottled, Pallor, Rubor, Erythema. Periwound temperature was noted as No Abnormality. Wound #3 status is Open. Original cause of wound was Gradually Appeared. The wound is located on the Right,Anterior Lower Leg. The wound measures 1.2cm length x 2cm width x 0.1cm depth; 1.885cm^2 area and 0.188cm^3 volume. The wound is limited to skin breakdown. There is no tunneling or undermining noted. There is a medium amount of serosanguineous drainage noted. The wound margin is distinct with the outline attached to the wound base. There is large (67-100%) granulation within the wound bed. There is no necrotic tissue within the wound bed. The periwound skin appearance exhibited: Localized Edema, Moist. The periwound skin appearance did not exhibit: Callus, Crepitus, Excoriation, Fluctuance, Friable, Induration, Rash, Scarring, Dry/Scaly, Maceration, Atrophie Blanche, Cyanosis, Ecchymosis, Hemosiderin Staining, Mottled, Pallor, Rubor, Erythema. Periwound temperature was noted as No Abnormality. Wound #4 status is Open. Original cause of wound was Gradually Appeared. The wound is located on the Left,Lateral Lower Leg. The  wound measures 4.4cm length x 2.5cm width x 0.2cm depth; 8.639cm^2 area and 1.728cm^3 volume. The wound is  limited to skin breakdown. There is no tunneling or undermining noted. There is a medium amount of serous drainage noted. The wound margin is flat and intact. There is small (1-33%) red, pink granulation within the wound bed. There is a large (67-100%) amount of necrotic tissue within the wound bed including Eschar and Adherent Slough. The periwound skin appearance did not exhibit: Callus, Crepitus, Excoriation, Fluctuance, Friable, Induration, Localized Edema, Rash, Scarring, Dry/Scaly, Maceration, Moist, Atrophie Blanche, Cyanosis, Ecchymosis, Hemosiderin Staining, Mottled, Pallor, Rubor, Erythema. Assessment Active Problems ICD-10 G82.21 - Paraplegia, complete L89.513 - Pressure ulcer of right ankle, stage 3 L89.523 - Pressure ulcer of left ankle, stage 3 L97.212 - Non-pressure chronic ulcer of right calf with fat layer exposed E66.01 - Morbid (severe) obesity due to excess calories Procedures Coscia, Windell E. ([id number]) Wound #1 Wound #1 is a To be determined located on the Left Achilles . There was a Skin/Subcutaneous Tissue Debridement [id number] debridement with total area of 5.25 sq cm performed by Christin Fudge, MD. with the following instrument(s): Curette to remove Viable and Non-Viable tissue/material including Fibrin/Slough, Eschar, and Subcutaneous after achieving pain control using Lidocaine 4% Topical Solution. A time out was conducted prior to the start of the procedure. A Minimum amount of bleeding was controlled with Silver Nitrate. The procedure was tolerated well with a pain level of 0 throughout and a pain level of 0 following the procedure. Post Debridement Measurements: 1.5cm length x 3.5cm width x 0.1cm depth; 0.412cm^3 volume. Post procedure Diagnosis Wound #1: Same as Pre-Procedure Wound #3 Wound #3 is a To be determined located on the Right,Anterior Lower Leg . There was a Skin/Subcutaneous Tissue Debridement [id number] debridement with total area of 2.4  sq cm performed by Christin Fudge, MD. with the following instrument(s): Curette to remove Viable and Non-Viable tissue/material including Fibrin/Slough, Eschar, and Subcutaneous after achieving pain control using Lidocaine 4% Topical Solution. A time out was conducted prior to the start of the procedure. A Minimum amount of bleeding was controlled with Pressure. The procedure was tolerated well with a pain level of 0 throughout and a pain level of 0 following the procedure. Post Debridement Measurements: 1.2cm length x 2cm width x 0.1cm depth; 0.188cm^3 volume. Post procedure Diagnosis Wound #3: Same as Pre-Procedure Wound #4 Wound #4 is a Pressure Ulcer located on the Left,Lateral Lower Leg . There was a Skin/Subcutaneous Tissue Debridement [id number] debridement with total area of 11 sq cm performed by Christin Fudge, MD. with the following instrument(s): Curette to remove Viable and Non-Viable tissue/material including Fibrin/Slough, Eschar, and Subcutaneous after achieving pain control using Lidocaine 4% Topical Solution. A time out was conducted prior to the start of the procedure. A Minimum amount of bleeding was controlled with Pressure. The procedure was tolerated well with a pain level of 0 throughout and a pain level of 0 following the procedure. Post Debridement Measurements: 4.4cm length x 2.5cm width x 0.2cm depth; 1.728cm^3 volume. Post debridement Stage noted as Category/Stage IV. Post procedure Diagnosis Wound #4: Same as Pre-Procedure Plan Wound Cleansing: Wound #1 Left Achilles: Cleanse wound with mild soap and water May Shower, gently pat wound dry prior to applying new dressing. Wound #2 Right Achilles: Cleanse wound with mild soap and water May Shower, gently pat wound dry prior to applying new dressing. Maino, Herb E. ([id number]) Wound #3 Right,Anterior Lower Leg: Cleanse wound with mild soap and water May  Shower, gently pat wound dry prior to applying new  dressing. Wound #4 Left,Lateral Lower Leg: Cleanse wound with mild soap and water May Shower, gently pat wound dry prior to applying new dressing. Anesthetic: Wound #1 Left Achilles: Topical Lidocaine 4% cream applied to wound bed prior to debridement Wound #2 Right Achilles: Topical Lidocaine 4% cream applied to wound bed prior to debridement Wound #3 Right,Anterior Lower Leg: Topical Lidocaine 4% cream applied to wound bed prior to debridement Wound #4 Left,Lateral Lower Leg: Topical Lidocaine 4% cream applied to wound bed prior to debridement Primary Wound Dressing: Wound #1 Left Achilles: Other: - Siltec with hydrogel, order RTD Wound #2 Right Achilles: Other: - Siltec with hydrogel, order RTD Wound #3 Right,Anterior Lower Leg: Other: - Siltec with hydrogel, order RTD Wound #4 Left,Lateral Lower Leg: Other: - Siltec with hydrogel, order RTD Secondary Dressing: Wound #1 Left Achilles: Boardered Foam Dressing Wound #2 Right Achilles: Boardered Foam Dressing Wound #3 Right,Anterior Lower Leg: Boardered Foam Dressing Wound #4 Left,Lateral Lower Leg: Boardered Foam Dressing Dressing Change Frequency: Wound #1 Left Achilles: Change dressing every other day. Wound #2 Right Achilles: Change dressing every other day. Wound #3 Right,Anterior Lower Leg: Change dressing every other day. Wound #4 Left,Lateral Lower Leg: Change dressing every other day. Follow-up Appointments: Wound #1 Left Achilles: Return Appointment in 1 week. Wound #2 Right Achilles: Return Appointment in 1 week. Wound #3 Right,Anterior Lower Leg: Return Appointment in 1 week. Wound #4 Left,Lateral Lower Leg: Return Appointment in 1 week. Seymour, Everrett E. ([id number]) Edema Control: Wound #1 Left Achilles: Other: - gauze and kerlix wrap from toes to 3cm below the knee Wound #2 Right Achilles: Other: - gauze and kerlix wrap from toes to 3cm below the knee Wound #3 Right,Anterior Lower Leg: Other: - gauze  and kerlix wrap from toes to 3cm below the knee Wound #4 Left,Lateral Lower Leg: Other: - gauze and kerlix wrap from toes to 3cm below the knee Additional Orders / Instructions: Wound #1 Left Achilles: Increase protein intake. Other: - Include vitamin C, Zinc and MVI in your dailuy supplement Wound #2 Right Achilles: Increase protein intake. Other: - Include vitamin C, Zinc and MVI in your dailuy supplement Wound #3 Right,Anterior Lower Leg: Increase protein intake. Other: - Include vitamin C, Zinc and MVI in your dailuy supplement Wound #4 Left,Lateral Lower Leg: Increase protein intake. Other: - Include vitamin C, Zinc and MVI in your dailuy supplement Have recommended: 1. Sorbact with hydrogel to be applied to the wounds this week and we will order him some RTD to be delivered to his home and he will bring this back to me next week. 2. we have also added Kerlix and Coban so that he has some compression and his wife will change this every other day 3. appropriate offloading has been discussed and he understands the importance of this. 4. He does not have a low pressure air mattress and on trying to order this for him he declines because of his living conditions. 5. Besides nutrition careful control of his proteins and vitamin C and zinc have been recommended. He will come back and see as at regular intervals. Electronic Signature(s) Signed: 12/26/2015 9:03:51 AM By: Christin Fudge MD, FACS Entered By: Christin Fudge on 12/26/2015 09:03:51 Manukyan, [name] ([id number]) -------------------------------------------------------------------------------- SuperBill Details Patient Name: [name] Date of Service: 12/26/2015 Medical Record Number: [id number] Patient Account Number: [id number] Date of Birth/Sex: 1986/03/19 (30 y.o. 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, 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, stage 3 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, stage 3 L97.212 Non-pressure chronic ulcer of right calf with  fat Modifier: layer exposed Quantity: 1 Physician Procedures CPT4 Code: [id number] Description: 11042 - WC PHYS SUBQ TISS 20 SQ CM ICD-10 Description Diagnosis 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

## 2015-12-28 NOTE — Progress Notes (Addendum)
[name] ([id number]) Visit Report for 12/26/2015 Arrival Information Details Patient Name: [name], [name] Date of Service: 12/26/2015 8:00 AM Medical Record Number: [id number] Patient Account Number: [id number] Date of Birth/Sex: 1986/10/20 (29 y.o. Male) Treating RN: Montey Hora Primary Care Physician: Milagros Evener Other Clinician: Referring Physician: Milagros Evener Treating Physician/Extender: Frann Rider in Treatment: 1 Visit Information History Since Last Visit Added or deleted any medications: No Patient Arrived: Wheel Chair Any new allergies or adverse reactions: No Arrival Time: 08:01 Had a fall or experienced change in No activities of daily living that may affect Accompanied By: self risk of falls: Transfer Assistance: None Signs or symptoms of abuse/neglect since last No Patient Identification Verified: Yes visito Secondary Verification Process Yes Hospitalized since last visit: No Completed: Pain Present Now: No Patient Requires Transmission-Based No Precautions: Patient Has Alerts: No Electronic Signature(s) Signed: 12/26/2015 5:19:21 PM By: Montey Hora Entered By: Montey Hora on 12/26/2015 08:02:53 Mortellaro, [name] ([id number]) -------------------------------------------------------------------------------- Encounter Discharge Information Details Patient Name: [name] Date of Service: 12/26/2015 8:00 AM Medical Record Number: [id number] Patient Account Number: [id number] Date of Birth/Sex: 05-28-1986 (29 y.o. Male) Treating RN: Montey Hora Primary Care Physician: Milagros Evener Other Clinician: Referring Physician: Milagros Evener Treating Physician/Extender: Frann Rider in Treatment: 1 Encounter Discharge Information Items Discharge Pain Level: 0 Discharge Condition: Stable Ambulatory Status: Wheelchair Discharge Destination: Home Transportation: Private Auto Accompanied By: self Schedule Follow-up  Appointment: No Medication Reconciliation completed No and provided to Patient/Care Ameah Chanda: Provided on Clinical Summary of Care: 12/26/2015 Form Type Recipient Paper Patient [facility] Notes pt states that he has an appointment in [place] next week Electronic Signature(s) Signed: 12/26/2015 5:19:21 PM By: Montey Hora Previous Signature: 12/26/2015 9:03:58 AM Version By: Ruthine Dose Entered By: Montey Hora on 12/26/2015 09:30:12 Rolfson, [name] ([id number]) -------------------------------------------------------------------------------- Lower Extremity Assessment Details Patient Name: [name] Date of Service: 12/26/2015 8:00 AM Medical Record Number: [id number] Patient Account Number: [id number] Date of Birth/Sex: 1986/10/14 (29 y.o. Male) Treating RN: Montey Hora Primary Care Physician: Milagros Evener Other Clinician: Referring Physician: Milagros Evener Treating Physician/Extender: Frann Rider in Treatment: 1 Edema Assessment Assessed: [Left: No] [Right: No] Edema: [Left: Yes] [Right: Yes] Vascular Assessment Pulses: Posterior Tibial Dorsalis Pedis Palpable: [Left:Yes] [Right:Yes] Extremity colors, hair growth, and conditions: Extremity Color: [Left:Normal] [Right:Normal] Hair Growth on Extremity: [Left:Yes] [Right:Yes] Temperature of Extremity: [Left:Warm] [Right:Warm] Capillary Refill: [Left:< 3 seconds] [Right:< 3 seconds] Electronic Signature(s) Signed: 12/26/2015 5:19:21 PM By: Montey Hora Entered By: Montey Hora on 12/26/2015 08:16:09 Lafortune, [name] ([id number]) -------------------------------------------------------------------------------- Multi Wound Chart Details Patient Name: [name] Date of Service: 12/26/2015 8:00 AM Medical Record Number: [id number] Patient Account Number: [id number] Date of Birth/Sex: 12-17-1985 (29 y.o. Male) Treating RN: Montey Hora Primary Care Physician: Milagros Evener Other Clinician: Referring  Physician: Milagros Evener Treating Physician/Extender: Frann Rider in Treatment: 1 Vital Signs Height(in): 70 Pulse(bpm): 133 Weight(lbs): 250 Blood Pressure 138/79 (mmHg): Body Mass Index(BMI): 36 Temperature(F): 97.9 Respiratory Rate 20 (breaths/min): Photos: [1:No Photos] [2:No Photos] [3:No Photos] Wound Location: [1:Left Achilles] [2:Right Achilles] [3:Right Lower Leg - Anterior] Wounding Event: [1:Gradually Appeared] [2:Gradually Appeared] [3:Gradually Appeared] Primary Etiology: [1:To be determined] [2:To be determined] [3:To be determined] Comorbid History: [1:Sleep Apnea, Hypertension] [2:Sleep Apnea, Hypertension] [3:Sleep Apnea, Hypertension] Date Acquired: [1:11/18/2015] [2:11/18/2015] [3:11/18/2015] Weeks of Treatment: [1:1] [2:1] [3:1] Wound Status: [1:Open] [2:Open] [3:Open] Measurements L x W x D 1.5x3.5x0.1 [2:0.5x0.6x0.1] [3:1.2x2x0.1] (cm) Area (cm) : [1:4.123] [2:0.236] [3:1.885] Volume (cm) : [1:0.412] [2:0.024] [3:0.188] %  Reduction in Area: [1:41.70%] [2:69.90%] [3:73.30%] % Reduction in Volume: 41.70% [2:69.60%] [3:73.40%] Classification: [1:Full Thickness Without Exposed Support Structures] [2:Full Thickness Without Exposed Support Structures] [3:Full Thickness Without Exposed Support Structures] Exudate Amount: [1:Medium] [2:Medium] [3:Medium] Exudate Type: [1:Serosanguineous] [2:Serosanguineous] [3:Serosanguineous] Exudate Color: [1:red, brown] [2:red, brown] [3:red, brown] Foul Odor After [1:Yes] [2:Yes] [3:Yes] Cleansing: Odor Anticipated Due to No [2:No] [3:No] Product Use: Wound Margin: [1:Distinct, outline attached] [2:Distinct, outline attached] [3:Distinct, outline attached] Granulation Amount: [1:Large (67-100%)] [2:Large (67-100%)] [3:Large (67-100%)] Granulation Quality: [1:Red, Pink] [2:Red, Pink, Hyper- granulation] [3:Hyper-granulation] Necrotic Amount: [1:None Present (0%)] [2:N/A] [3:None Present (0%)] Necrotic Tissue:  N/A N/A N/A Exposed Structures: Fascia: No Fascia: No Fascia: No Fat: No Fat: No Fat: No Tendon: No Tendon: No Tendon: No Muscle: No Muscle: No Muscle: No Joint: No Joint: No Joint: No Bone: No Bone: No Bone: No Limited to Skin Limited to Skin Limited to Skin Breakdown Breakdown Breakdown Epithelialization: None None None Periwound Skin Texture: Edema: Yes Edema: Yes Edema: Yes Excoriation: No Excoriation: No Excoriation: No Induration: No Induration: No Induration: No Callus: No Callus: No Callus: No Crepitus: No Crepitus: No Crepitus: No Fluctuance: No Fluctuance: No Fluctuance: No Friable: No Friable: No Friable: No Rash: No Rash: No Rash: No Scarring: No Scarring: No Scarring: No Periwound Skin Moist: Yes Moist: Yes Moist: Yes Moisture: Maceration: No Maceration: No Maceration: No Dry/Scaly: No Dry/Scaly: No Dry/Scaly: No Periwound Skin Color: Atrophie Blanche: No Atrophie Blanche: No Atrophie Blanche: No Cyanosis: No Cyanosis: No Cyanosis: No Ecchymosis: No Ecchymosis: No Ecchymosis: No Erythema: No Erythema: No Erythema: No Hemosiderin Staining: No Hemosiderin Staining: No Hemosiderin Staining: No Mottled: No Mottled: No Mottled: No Pallor: No Pallor: No Pallor: No Rubor: No Rubor: No Rubor: No Temperature: No Abnormality No Abnormality No Abnormality Tenderness on No No No Palpation: Wound Preparation: Ulcer Cleansing: Ulcer Cleansing: Ulcer Cleansing: Rinsed/Irrigated with Rinsed/Irrigated with Rinsed/Irrigated with Saline Saline Saline Topical Anesthetic Topical Anesthetic Topical Anesthetic Applied: Other: lidocaine Applied: Other: lidocaine 4 Applied: Other: lidocaine 4% % 4% Wound Number: 4 N/A N/A Photos: No Photos N/A N/A Wound Location: Left Lower Leg - Lateral N/A N/A Wounding Event: Gradually Appeared N/A N/A Primary Etiology: Pressure Ulcer N/A N/A Comorbid History: Sleep Apnea, N/A  N/A Hypertension Date Acquired: 12/26/2015 N/A N/A Weeks of Treatment: 0 N/A N/A Estelle, Jahquez E. ([id number]) Wound Status: Open N/A N/A Measurements L x W x D 4.4x2.5x0.2 N/A N/A (cm) Area (cm) : 8.639 N/A N/A Volume (cm) : 1.728 N/A N/A % Reduction in Area: N/A N/A N/A % Reduction in Volume: N/A N/A N/A Classification: Category/Stage IV N/A N/A Exudate Amount: Medium N/A N/A Exudate Type: Serous N/A N/A Exudate Color: amber N/A N/A Foul Odor After No N/A N/A Cleansing: Odor Anticipated Due to N/A N/A N/A Product Use: Wound Margin: Flat and Intact N/A N/A Granulation Amount: Small (1-33%) N/A N/A Granulation Quality: Red, Pink N/A N/A Necrotic Amount: Large (67-100%) N/A N/A Necrotic Tissue: Eschar, Adherent Slough N/A N/A Exposed Structures: Tendon: Yes N/A N/A Fascia: No Fat: No Muscle: No Joint: No Bone: No Epithelialization: None N/A N/A Periwound Skin Texture: Edema: No N/A N/A Excoriation: No Induration: No Callus: No Crepitus: No Fluctuance: No Friable: No Rash: No Scarring: No Periwound Skin Maceration: No N/A N/A Moisture: Moist: No Dry/Scaly: No Periwound Skin Color: Atrophie Blanche: No N/A N/A Cyanosis: No Ecchymosis: No Erythema: No Hemosiderin Staining: No Mottled: No Pallor: No Rubor: No Temperature: N/A N/A N/A Tenderness on No N/A N/A Palpation: Scarola, Maruice E. (  [id number]) Wound Preparation: Ulcer Cleansing: N/A N/A Rinsed/Irrigated with Saline Topical Anesthetic Applied: Other: lidocaine 4% Treatment Notes Electronic Signature(s) Signed: 12/26/2015 8:28:28 AM By: Montey Hora Entered By: Montey Hora on 12/26/2015 08:28:28 Wilensky, [name] ([id number]) -------------------------------------------------------------------------------- [place] Details Patient Name: [name] Date of Service: 12/26/2015 8:00 AM Medical Record Number: [id number] Patient Account Number: [id number] Date of Birth/Sex: 09-Mar-1986  (29 y.o. Male) Treating RN: Montey Hora Primary Care Physician: Milagros Evener Other Clinician: Referring Physician: Milagros Evener Treating Physician/Extender: Frann Rider in Treatment: 1 Active Inactive Electronic Signature(s) Signed: 01/06/2016 4:13:49 PM By: Montey Hora Previous Signature: 12/26/2015 8:28:22 AM Version By: Montey Hora Entered By: Montey Hora on 01/06/2016 16:13:48 Leavitt, [name] ([id number]) -------------------------------------------------------------------------------- Patient/Caregiver Education Details Patient Name: [name] Date of Service: 12/26/2015 8:00 AM Medical Record Number: [id number] Patient Account Number: [id number] Date of Birth/Gender: January 28, 1986 (29 y.o. Male) Treating RN: Montey Hora Primary Care Physician: Milagros Evener Other Clinician: Referring Physician: Milagros Evener Treating Physician/Extender: Frann Rider in Treatment: 1 Education Assessment Education Provided To: Patient Education Topics Provided Venous: Handouts: Controlling Swelling with Multilayered Compression Wraps Methods: Explain/Verbal, Printed Responses: State content correctly Electronic Signature(s) Signed: 12/26/2015 5:19:21 PM By: Montey Hora Entered By: Montey Hora on 12/26/2015 09:30:23 Fujita, [name] ([id number]) -------------------------------------------------------------------------------- Wound Assessment Details Patient Name: [name] Date of Service: 12/26/2015 8:00 AM Medical Record Number: [id number] Patient Account Number: [id number] Date of Birth/Sex: [birth year] (29 y.o. Male) Treating RN: Montey Hora Primary Care Physician: Milagros Evener Other Clinician: Referring Physician: Milagros Evener Treating Physician/Extender: Frann Rider in Treatment: 1 Wound Status Wound Number: 1 Primary Etiology: To be determined Wound Location: Left Achilles Wound Status: Open Wounding Event:  Gradually Appeared Comorbid History: Sleep Apnea, Hypertension Date Acquired: 11/18/2015 Weeks Of Treatment: 1 Clustered Wound: No Photos Photo Uploaded By: Montey Hora on 12/26/2015 11:41:21 Wound Measurements Length: (cm) 1.5 Width: (cm) 3.5 Depth: (cm) 0.1 Area: (cm) 4.123 Volume: (cm) 0.412 % Reduction in Area: 41.7% % Reduction in Volume: 41.7% Epithelialization: None Tunneling: No Undermining: No Wound Description Full Thickness Without Exposed Classification: Support Structures Wound Margin: Distinct, outline attached Exudate Medium Amount: Exudate Type: Serosanguineous Exudate Color: red, brown Foul Odor After Cleansing: Yes Due to Product Use: No Wound Bed Granulation Amount: Large (67-100%) Exposed Structure Granulation Quality: Red, Pink Fascia Exposed: No Necrotic Amount: None Present (0%) Fat Layer Exposed: No Vari, Kipp E. ([id number]) Tendon Exposed: No Muscle Exposed: No Joint Exposed: No Bone Exposed: No Limited to Skin Breakdown Periwound Skin Texture Texture Color No Abnormalities Noted: No No Abnormalities Noted: No Callus: No Atrophie Blanche: No Crepitus: No Cyanosis: No Excoriation: No Ecchymosis: No Fluctuance: No Erythema: No Friable: No Hemosiderin Staining: No Induration: No Mottled: No Localized Edema: Yes Pallor: No Rash: No Rubor: No Scarring: No Temperature / Pain Moisture Temperature: No Abnormality No Abnormalities Noted: No Dry / Scaly: No Maceration: No Moist: Yes Wound Preparation Ulcer Cleansing: Rinsed/Irrigated with Saline Topical Anesthetic Applied: Other: lidocaine 4%, Electronic Signature(s) Signed: 12/26/2015 8:27:38 AM By: Montey Hora Entered By: Montey Hora on 12/26/2015 08:27:38 Krist, [name] ([id number]) -------------------------------------------------------------------------------- Wound Assessment Details Patient Name: [name] Date of Service: 12/26/2015 8:00 AM Medical  Record Number: [id number] Patient Account Number: [id number] Date of Birth/Sex: 04/01/1986 (29 y.o. Male) Treating RN: Montey Hora Primary Care Physician: Milagros Evener Other Clinician: Referring Physician: Milagros Evener Treating Physician/Extender: Frann Rider in Treatment: 1 Wound Status Wound Number: 2 Primary Etiology: To be determined Wound Location: Right  Achilles Wound Status: Open Wounding Event: Gradually Appeared Comorbid History: Sleep Apnea, Hypertension Date Acquired: 11/18/2015 Weeks Of Treatment: 1 Clustered Wound: No Photos Photo Uploaded By: Montey Hora on 12/26/2015 11:41:22 Wound Measurements Length: (cm) 0.5 Width: (cm) 0.6 Depth: (cm) 0.1 Area: (cm) 0.236 Volume: (cm) 0.024 % Reduction in Area: 69.9% % Reduction in Volume: 69.6% Epithelialization: None Tunneling: No Undermining: No Wound Description Full Thickness Without Exposed Classification: Support Structures Wound Margin: Distinct, outline attached Exudate Medium Amount: Exudate Type: Serosanguineous Exudate Color: red, brown Foul Odor After Cleansing: Yes Due to Product Use: No Wound Bed Granulation Amount: Large (67-100%) Exposed Structure Granulation Quality: Red, Pink, Hyper-granulation Fascia Exposed: No Fat Layer Exposed: No Freas, Khali E. ([id number]) Tendon Exposed: No Muscle Exposed: No Joint Exposed: No Bone Exposed: No Limited to Skin Breakdown Periwound Skin Texture Texture Color No Abnormalities Noted: No No Abnormalities Noted: No Callus: No Atrophie Blanche: No Crepitus: No Cyanosis: No Excoriation: No Ecchymosis: No Fluctuance: No Erythema: No Friable: No Hemosiderin Staining: No Induration: No Mottled: No Localized Edema: Yes Pallor: No Rash: No Rubor: No Scarring: No Temperature / Pain Moisture Temperature: No Abnormality No Abnormalities Noted: No Dry / Scaly: No Maceration: No Moist: Yes Wound Preparation Ulcer  Cleansing: Rinsed/Irrigated with Saline Topical Anesthetic Applied: Other: lidocaine 4 %, Electronic Signature(s) Signed: 12/26/2015 8:27:48 AM By: Montey Hora Entered By: Montey Hora on 12/26/2015 08:27:48 Word, [name] ([id number]) -------------------------------------------------------------------------------- Wound Assessment Details Patient Name: [name] Date of Service: 12/26/2015 8:00 AM Medical Record Number: [id number] Patient Account Number: [id number] Date of Birth/Sex: 17-Aug-1986 (29 y.o. Male) Treating RN: Montey Hora Primary Care Physician: Milagros Evener Other Clinician: Referring Physician: Milagros Evener Treating Physician/Extender: Frann Rider in Treatment: 1 Wound Status Wound Number: 3 Primary Etiology: To be determined Wound Location: Right Lower Leg - Anterior Wound Status: Open Wounding Event: Gradually Appeared Comorbid History: Sleep Apnea, Hypertension Date Acquired: 11/18/2015 Weeks Of Treatment: 1 Clustered Wound: No Photos Photo Uploaded By: Montey Hora on 12/26/2015 11:41:46 Wound Measurements Length: (cm) 1.2 Width: (cm) 2 Depth: (cm) 0.1 Area: (cm) 1.885 Volume: (cm) 0.188 % Reduction in Area: 73.3% % Reduction in Volume: 73.4% Epithelialization: None Tunneling: No Undermining: No Wound Description Full Thickness Without Exposed Classification: Support Structures Wound Margin: Distinct, outline attached Exudate Medium Amount: Exudate Type: Serosanguineous Exudate Color: red, brown Foul Odor After Cleansing: Yes Due to Product Use: No Wound Bed Granulation Amount: Large (67-100%) Exposed Structure Granulation Quality: Hyper-granulation Fascia Exposed: No Necrotic Amount: None Present (0%) Fat Layer Exposed: No Railsback, Jamez E. ([id number]) Tendon Exposed: No Muscle Exposed: No Joint Exposed: No Bone Exposed: No Limited to Skin Breakdown Periwound Skin Texture Texture Color No Abnormalities  Noted: No No Abnormalities Noted: No Callus: No Atrophie Blanche: No Crepitus: No Cyanosis: No Excoriation: No Ecchymosis: No Fluctuance: No Erythema: No Friable: No Hemosiderin Staining: No Induration: No Mottled: No Localized Edema: Yes Pallor: No Rash: No Rubor: No Scarring: No Temperature / Pain Moisture Temperature: No Abnormality No Abnormalities Noted: No Dry / Scaly: No Maceration: No Moist: Yes Wound Preparation Ulcer Cleansing: Rinsed/Irrigated with Saline Topical Anesthetic Applied: Other: lidocaine 4%, Electronic Signature(s) Signed: 12/26/2015 8:28:02 AM By: Montey Hora Entered By: Montey Hora on 12/26/2015 08:28:02 Vancuren, [name] ([id number]) -------------------------------------------------------------------------------- Wound Assessment Details Patient Name: [name] Date of Service: 12/26/2015 8:00 AM Medical Record Number: [id number] Patient Account Number: [id number] Date of Birth/Sex: [birth year] (29 y.o. Male) Treating RN: Montey Hora Primary Care Physician: Milagros Evener Other Clinician: Referring Physician:  Radene Ou, [place] Treating Physician/Extender: Frann Rider in Treatment: 1 Wound Status Wound Number: 4 Primary Etiology: Pressure Ulcer Wound Location: Left Lower Leg - Lateral Wound Status: Open Wounding Event: Gradually Appeared Comorbid History: Sleep Apnea, Hypertension Date Acquired: 12/26/2015 Weeks Of Treatment: 0 Clustered Wound: No Photos Photo Uploaded By: Montey Hora on 12/26/2015 11:41:46 Wound Measurements Length: (cm) 4.4 Width: (cm) 2.5 Depth: (cm) 0.2 Area: (cm) 8.639 Volume: (cm) 1.728 % Reduction in Area: 0% % Reduction in Volume: 0% Epithelialization: None Tunneling: No Undermining: No Wound Description Classification: Category/Stage III Wound Margin: Flat and Intact Exudate Amount: Medium Exudate Type: Serous Exudate Color: amber Foul Odor After Cleansing: No Wound  Bed Granulation Amount: Small (1-33%) Exposed Structure Granulation Quality: Red, Pink Fascia Exposed: No Necrotic Amount: Large (67-100%) Fat Layer Exposed: No Necrotic Quality: Eschar, Adherent Slough Tendon Exposed: No Ellender, Garan E. ([id number]) Muscle Exposed: No Joint Exposed: No Bone Exposed: No Limited to Skin Breakdown Periwound Skin Texture Texture Color No Abnormalities Noted: No No Abnormalities Noted: No Callus: No Atrophie Blanche: No Crepitus: No Cyanosis: No Excoriation: No Ecchymosis: No Fluctuance: No Erythema: No Friable: No Hemosiderin Staining: No Induration: No Mottled: No Localized Edema: No Pallor: No Rash: No Rubor: No Scarring: No Moisture No Abnormalities Noted: No Dry / Scaly: No Maceration: No Moist: No Wound Preparation Ulcer Cleansing: Rinsed/Irrigated with Saline Topical Anesthetic Applied: Other: lidocaine 4%, Electronic Signature(s) Signed: 12/26/2015 5:19:21 PM By: Montey Hora Entered By: Montey Hora on 12/26/2015 08:43:34 Debroux, [name] ([id number]) -------------------------------------------------------------------------------- [place] Details Patient Name: [name] Date of Service: 12/26/2015 8:00 AM Medical Record Number: [id number] Patient Account Number: [id number] Date of Birth/Sex: 1986-10-22 (29 y.o. Male) Treating RN: Montey Hora Primary Care Physician: Milagros Evener Other Clinician: Referring Physician: Milagros Evener Treating Physician/Extender: Frann Rider in Treatment: 1 Vital Signs Time Taken: 08:05 Temperature (F): 97.9 Height (in): 70 Pulse (bpm): 133 Weight (lbs): 250 Respiratory Rate (breaths/min): 20 Body Mass Index (BMI): 35.9 Blood Pressure (mmHg): 138/79 Reference Range: 80 - 120 mg / dl Electronic Signature(s) Signed: 12/26/2015 5:19:21 PM By: Montey Hora Entered By: Montey Hora on 12/26/2015 08:06:32

## 2016-01-02 ENCOUNTER — Encounter (HOSPITAL_BASED_OUTPATIENT_CLINIC_OR_DEPARTMENT_OTHER): Payer: BLUE CROSS/BLUE SHIELD | Attending: Internal Medicine

## 2016-01-02 DIAGNOSIS — I87332 Chronic venous hypertension (idiopathic) with ulcer and inflammation of left lower extremity: Secondary | ICD-10-CM | POA: Diagnosis not present

## 2016-01-02 DIAGNOSIS — Z87891 Personal history of nicotine dependence: Secondary | ICD-10-CM | POA: Insufficient documentation

## 2016-01-02 DIAGNOSIS — G82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, complete: Secondary | ICD-10-CM | POA: Insufficient documentation

## 2016-01-02 DIAGNOSIS — L97821 Non-pressure chronic ulcer of other part of left lower leg limited to breakdown of skin: Secondary | ICD-10-CM | POA: Diagnosis not present

## 2016-01-02 DIAGNOSIS — Z89422 Acquired absence of other left toe(s): Secondary | ICD-10-CM | POA: Insufficient documentation

## 2016-01-02 DIAGNOSIS — L89513 Pressure ulcer of right ankle, stage 3: Secondary | ICD-10-CM | POA: Diagnosis not present

## 2016-01-02 DIAGNOSIS — Z9081 Acquired absence of spleen: Secondary | ICD-10-CM | POA: Insufficient documentation

## 2016-01-02 DIAGNOSIS — L97811 Non-pressure chronic ulcer of other part of right lower leg limited to breakdown of skin: Secondary | ICD-10-CM | POA: Diagnosis not present

## 2016-01-05 ENCOUNTER — Encounter (HOSPITAL_BASED_OUTPATIENT_CLINIC_OR_DEPARTMENT_OTHER): Payer: PRIVATE HEALTH INSURANCE

## 2016-01-09 DIAGNOSIS — I87332 Chronic venous hypertension (idiopathic) with ulcer and inflammation of left lower extremity: Secondary | ICD-10-CM | POA: Diagnosis not present

## 2016-01-16 DIAGNOSIS — I87332 Chronic venous hypertension (idiopathic) with ulcer and inflammation of left lower extremity: Secondary | ICD-10-CM | POA: Diagnosis not present

## 2016-01-23 ENCOUNTER — Encounter (HOSPITAL_BASED_OUTPATIENT_CLINIC_OR_DEPARTMENT_OTHER): Payer: BLUE CROSS/BLUE SHIELD | Attending: Internal Medicine

## 2016-01-23 DIAGNOSIS — L89622 Pressure ulcer of left heel, stage 2: Secondary | ICD-10-CM | POA: Diagnosis present

## 2016-01-23 DIAGNOSIS — I87302 Chronic venous hypertension (idiopathic) without complications of left lower extremity: Secondary | ICD-10-CM | POA: Insufficient documentation

## 2016-01-23 DIAGNOSIS — L89890 Pressure ulcer of other site, unstageable: Secondary | ICD-10-CM | POA: Insufficient documentation

## 2016-01-23 DIAGNOSIS — B9562 Methicillin resistant Staphylococcus aureus infection as the cause of diseases classified elsewhere: Secondary | ICD-10-CM | POA: Insufficient documentation

## 2016-01-23 DIAGNOSIS — I10 Essential (primary) hypertension: Secondary | ICD-10-CM | POA: Diagnosis not present

## 2016-01-23 DIAGNOSIS — G8220 Paraplegia, unspecified: Secondary | ICD-10-CM | POA: Insufficient documentation

## 2016-01-23 DIAGNOSIS — G4730 Sleep apnea, unspecified: Secondary | ICD-10-CM | POA: Insufficient documentation

## 2016-01-29 ENCOUNTER — Other Ambulatory Visit: Payer: Self-pay | Admitting: Internal Medicine

## 2016-01-29 DIAGNOSIS — R600 Localized edema: Secondary | ICD-10-CM

## 2016-01-29 DIAGNOSIS — I739 Peripheral vascular disease, unspecified: Secondary | ICD-10-CM

## 2016-01-30 ENCOUNTER — Ambulatory Visit (HOSPITAL_COMMUNITY)
Admission: RE | Admit: 2016-01-30 | Discharge: 2016-01-30 | Disposition: A | Discharge status: Home / Self Care | Payer: BLUE CROSS/BLUE SHIELD | Source: Ambulatory Visit | Attending: Vascular Surgery | Admitting: Vascular Surgery

## 2016-01-30 ENCOUNTER — Ambulatory Visit (INDEPENDENT_AMBULATORY_CARE_PROVIDER_SITE_OTHER)
Admission: RE | Admit: 2016-01-30 | Discharge: 2016-01-30 | Disposition: A | Discharge status: Home / Self Care | Payer: BLUE CROSS/BLUE SHIELD | Source: Ambulatory Visit | Attending: Vascular Surgery | Admitting: Vascular Surgery

## 2016-01-30 DIAGNOSIS — L89622 Pressure ulcer of left heel, stage 2: Secondary | ICD-10-CM | POA: Diagnosis not present

## 2016-01-30 DIAGNOSIS — I739 Peripheral vascular disease, unspecified: Secondary | ICD-10-CM | POA: Insufficient documentation

## 2016-01-30 DIAGNOSIS — R600 Localized edema: Secondary | ICD-10-CM | POA: Insufficient documentation

## 2016-01-30 DIAGNOSIS — Z993 Dependence on wheelchair: Secondary | ICD-10-CM | POA: Insufficient documentation

## 2016-02-02 ENCOUNTER — Encounter (HOSPITAL_COMMUNITY): Payer: PRIVATE HEALTH INSURANCE

## 2016-02-05 DIAGNOSIS — L89622 Pressure ulcer of left heel, stage 2: Secondary | ICD-10-CM | POA: Diagnosis not present

## 2016-02-12 DIAGNOSIS — L89622 Pressure ulcer of left heel, stage 2: Secondary | ICD-10-CM | POA: Diagnosis not present

## 2016-02-19 DIAGNOSIS — L89622 Pressure ulcer of left heel, stage 2: Secondary | ICD-10-CM | POA: Diagnosis not present

## 2016-02-26 ENCOUNTER — Encounter (HOSPITAL_BASED_OUTPATIENT_CLINIC_OR_DEPARTMENT_OTHER): Payer: BLUE CROSS/BLUE SHIELD | Attending: Internal Medicine

## 2016-02-26 DIAGNOSIS — I10 Essential (primary) hypertension: Secondary | ICD-10-CM | POA: Insufficient documentation

## 2016-02-26 DIAGNOSIS — L89893 Pressure ulcer of other site, stage 3: Secondary | ICD-10-CM | POA: Diagnosis not present

## 2016-02-26 DIAGNOSIS — G8220 Paraplegia, unspecified: Secondary | ICD-10-CM | POA: Diagnosis not present

## 2016-02-26 DIAGNOSIS — G4730 Sleep apnea, unspecified: Secondary | ICD-10-CM | POA: Insufficient documentation

## 2016-03-04 DIAGNOSIS — I10 Essential (primary) hypertension: Secondary | ICD-10-CM | POA: Diagnosis not present

## 2016-03-04 DIAGNOSIS — G8220 Paraplegia, unspecified: Secondary | ICD-10-CM | POA: Diagnosis not present

## 2016-03-04 DIAGNOSIS — G4730 Sleep apnea, unspecified: Secondary | ICD-10-CM | POA: Diagnosis not present

## 2016-03-04 DIAGNOSIS — L89893 Pressure ulcer of other site, stage 3: Secondary | ICD-10-CM | POA: Diagnosis not present

## 2016-03-10 ENCOUNTER — Encounter: Payer: Self-pay | Admitting: Vascular Surgery

## 2016-03-11 DIAGNOSIS — L89893 Pressure ulcer of other site, stage 3: Secondary | ICD-10-CM | POA: Diagnosis not present

## 2016-03-11 DIAGNOSIS — G4730 Sleep apnea, unspecified: Secondary | ICD-10-CM | POA: Diagnosis not present

## 2016-03-11 DIAGNOSIS — G8220 Paraplegia, unspecified: Secondary | ICD-10-CM | POA: Diagnosis not present

## 2016-03-11 DIAGNOSIS — I10 Essential (primary) hypertension: Secondary | ICD-10-CM | POA: Diagnosis not present

## 2016-03-16 ENCOUNTER — Ambulatory Visit (INDEPENDENT_AMBULATORY_CARE_PROVIDER_SITE_OTHER): Payer: BLUE CROSS/BLUE SHIELD | Admitting: Vascular Surgery

## 2016-03-16 ENCOUNTER — Encounter: Payer: Self-pay | Admitting: Vascular Surgery

## 2016-03-16 VITALS — BP 129/82 | HR 60 | Temp 98.0°F | Resp 16 | Ht 70.0 in | Wt 260.0 lb

## 2016-03-16 DIAGNOSIS — I83892 Varicose veins of left lower extremities with other complications: Secondary | ICD-10-CM
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
## 2016-03-18 DIAGNOSIS — I10 Essential (primary) hypertension: Secondary | ICD-10-CM | POA: Diagnosis not present

## 2016-03-18 DIAGNOSIS — G4730 Sleep apnea, unspecified: Secondary | ICD-10-CM | POA: Diagnosis not present

## 2016-03-18 DIAGNOSIS — L89893 Pressure ulcer of other site, stage 3: Secondary | ICD-10-CM | POA: Diagnosis not present

## 2016-03-18 DIAGNOSIS — G8220 Paraplegia, unspecified: Secondary | ICD-10-CM | POA: Diagnosis not present

## 2016-03-23 ENCOUNTER — Telehealth: Payer: Self-pay | Admitting: Vascular Surgery

## 2016-03-23 ENCOUNTER — Other Ambulatory Visit: Payer: Self-pay | Admitting: *Deleted

## 2016-03-23 DIAGNOSIS — I83022 Varicose veins of left lower extremity with ulcer of calf: Secondary | ICD-10-CM

## 2016-03-23 DIAGNOSIS — L97229 Non-pressure chronic ulcer of left calf with unspecified severity: Principal | ICD-10-CM

## 2016-03-23 NOTE — Telephone Encounter (Signed)
Appt is sched for 04/21/16, lab at 2:00 and TFE (CSD is not here, TFE added day on) at 2:45.

## 2016-03-23 NOTE — Telephone Encounter (Signed)
Rolla Flatten, RN  Michele A [place] then do it then and have Scot Dock see him after. Let me know the md appt time. Thx       Previous Messages     ----- Message -----   From: Georgiann Mccoy   Sent: 03/23/2016  3:25 PM    To: Rolla Flatten, RN   We are closed Monday the 29th and we are down two ultrasound rooms. If the post laser abl spots were in room 1 and 2 then we do not have the option to shedule there.   ----- Message -----   From: Rolla Flatten, RN   Sent: 03/23/2016  3:12 PM    To: Georgiann Mccoy   Where did my post laser slots go?  ----- Message -----   From: Georgiann Mccoy   Sent: 03/23/2016  3:02 PM    To: Rolla Flatten, RN   The only lab space that week is on 5/31 at 2.   ----- Message -----   From: Rolla Flatten, RN   Sent: 03/23/2016  2:01 PM    To: [facility] Admin Pool   Kreed Crib needs one wk fu lab and JDL on 5/30. Thx 13-Aug-1986

## 2016-03-25 ENCOUNTER — Encounter (HOSPITAL_BASED_OUTPATIENT_CLINIC_OR_DEPARTMENT_OTHER): Payer: BLUE CROSS/BLUE SHIELD | Attending: Internal Medicine

## 2016-03-25 DIAGNOSIS — I87332 Chronic venous hypertension (idiopathic) with ulcer and inflammation of left lower extremity: Secondary | ICD-10-CM | POA: Insufficient documentation

## 2016-03-25 DIAGNOSIS — G4730 Sleep apnea, unspecified: Secondary | ICD-10-CM | POA: Insufficient documentation

## 2016-03-25 DIAGNOSIS — S91109A Unspecified open wound of unspecified toe(s) without damage to nail, initial encounter: Secondary | ICD-10-CM | POA: Diagnosis not present

## 2016-03-25 DIAGNOSIS — L89893 Pressure ulcer of other site, stage 3: Secondary | ICD-10-CM | POA: Diagnosis not present

## 2016-03-25 DIAGNOSIS — L97821 Non-pressure chronic ulcer of other part of left lower leg limited to breakdown of skin: Secondary | ICD-10-CM | POA: Diagnosis not present

## 2016-03-25 DIAGNOSIS — Z79899 Other long term (current) drug therapy: Secondary | ICD-10-CM | POA: Insufficient documentation

## 2016-03-25 DIAGNOSIS — I87312 Chronic venous hypertension (idiopathic) with ulcer of left lower extremity: Secondary | ICD-10-CM | POA: Diagnosis not present

## 2016-03-25 DIAGNOSIS — I10 Essential (primary) hypertension: Secondary | ICD-10-CM | POA: Diagnosis not present

## 2016-03-25 DIAGNOSIS — G82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, complete: Secondary | ICD-10-CM | POA: Diagnosis not present

## 2016-03-25 DIAGNOSIS — B353 Tinea pedis: Secondary | ICD-10-CM | POA: Insufficient documentation

## 2016-03-25 DIAGNOSIS — X58XXXA Exposure to other specified factors, initial encounter: Secondary | ICD-10-CM | POA: Diagnosis not present

## 2016-04-01 DIAGNOSIS — S91105A Unspecified open wound of left lesser toe(s) without damage to nail, initial encounter: Secondary | ICD-10-CM | POA: Diagnosis not present

## 2016-04-01 DIAGNOSIS — I87332 Chronic venous hypertension (idiopathic) with ulcer and inflammation of left lower extremity: Secondary | ICD-10-CM | POA: Diagnosis not present

## 2016-04-01 DIAGNOSIS — I10 Essential (primary) hypertension: Secondary | ICD-10-CM | POA: Diagnosis not present

## 2016-04-01 DIAGNOSIS — X58XXXA Exposure to other specified factors, initial encounter: Secondary | ICD-10-CM | POA: Diagnosis not present

## 2016-04-01 DIAGNOSIS — L89893 Pressure ulcer of other site, stage 3: Secondary | ICD-10-CM | POA: Diagnosis not present

## 2016-04-01 DIAGNOSIS — B353 Tinea pedis: Secondary | ICD-10-CM | POA: Diagnosis not present

## 2016-04-01 DIAGNOSIS — Z79899 Other long term (current) drug therapy: Secondary | ICD-10-CM | POA: Diagnosis not present

## 2016-04-01 DIAGNOSIS — G4730 Sleep apnea, unspecified: Secondary | ICD-10-CM | POA: Diagnosis not present

## 2016-04-01 DIAGNOSIS — L97821 Non-pressure chronic ulcer of other part of left lower leg limited to breakdown of skin: Secondary | ICD-10-CM | POA: Diagnosis not present

## 2016-04-01 DIAGNOSIS — G82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, complete: Secondary | ICD-10-CM | POA: Diagnosis not present

## 2016-04-01 DIAGNOSIS — S91109A Unspecified open wound of unspecified toe(s) without damage to nail, initial encounter: Secondary | ICD-10-CM | POA: Diagnosis not present

## 2016-04-06 ENCOUNTER — Encounter: Payer: Self-pay | Admitting: Vascular Surgery

## 2016-04-08 DIAGNOSIS — L89893 Pressure ulcer of other site, stage 3: Secondary | ICD-10-CM | POA: Diagnosis not present

## 2016-04-08 DIAGNOSIS — Z79899 Other long term (current) drug therapy: Secondary | ICD-10-CM | POA: Diagnosis not present

## 2016-04-08 DIAGNOSIS — S91109A Unspecified open wound of unspecified toe(s) without damage to nail, initial encounter: Secondary | ICD-10-CM | POA: Diagnosis not present

## 2016-04-08 DIAGNOSIS — X58XXXA Exposure to other specified factors, initial encounter: Secondary | ICD-10-CM | POA: Diagnosis not present

## 2016-04-08 DIAGNOSIS — I87332 Chronic venous hypertension (idiopathic) with ulcer and inflammation of left lower extremity: Secondary | ICD-10-CM | POA: Diagnosis not present

## 2016-04-08 DIAGNOSIS — G82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, complete: Secondary | ICD-10-CM | POA: Diagnosis not present

## 2016-04-08 DIAGNOSIS — L97821 Non-pressure chronic ulcer of other part of left lower leg limited to breakdown of skin: Secondary | ICD-10-CM | POA: Diagnosis not present

## 2016-04-08 DIAGNOSIS — I10 Essential (primary) hypertension: Secondary | ICD-10-CM | POA: Diagnosis not present

## 2016-04-08 DIAGNOSIS — B353 Tinea pedis: Secondary | ICD-10-CM | POA: Diagnosis not present

## 2016-04-08 DIAGNOSIS — S91105A Unspecified open wound of left lesser toe(s) without damage to nail, initial encounter: Secondary | ICD-10-CM | POA: Diagnosis not present

## 2016-04-08 DIAGNOSIS — G4730 Sleep apnea, unspecified: Secondary | ICD-10-CM | POA: Diagnosis not present

## 2016-04-12 ENCOUNTER — Encounter: Payer: Self-pay | Admitting: Vascular Surgery

## 2016-04-12 ENCOUNTER — Ambulatory Visit (INDEPENDENT_AMBULATORY_CARE_PROVIDER_SITE_OTHER): Payer: BLUE CROSS/BLUE SHIELD | Admitting: Vascular Surgery

## 2016-04-12 VITALS — BP 131/71 | HR 107 | Temp 97.8°F | Resp 16 | Ht 70.0 in | Wt 260.0 lb

## 2016-04-12 DIAGNOSIS — I83892 Varicose veins of left lower extremities with other complications: Secondary | ICD-10-CM | POA: Diagnosis not present

## 2016-04-12 HISTORY — PX: ENDOVENOUS ABLATION SAPHENOUS VEIN W/ LASER: SUR449
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
## 2016-04-12 NOTE — Progress Notes (Signed)
Subjective:     Patient ID: DRAYCEN WEISHAUPT, male   DOB: 1986-11-20, 30 y.o.   MRN: [id number]  HPI this 30 year old male with paraplegia had laser ablation of the left great saphenous vein from the proximal calf to near the saphenofemoral junction performed under local tumescent anesthesia for gross reflux with chronic edema and history of multiple venous stasis ulcers. He tolerated the procedure well. A total of approximately 2500 J of energy was utilized.   Review of Systems     Objective:   Physical Exam BP 131/71 mmHg  Pulse 107  Temp(Src) 97.8 F (36.6 C)  Resp 16  Ht 5\' 10"  (1.778 m)  Wt 260 lb (117.935 kg)  BMI 37.31 kg/m2  SpO2 99%       Assessment:     Well-tolerated laser ablation left great saphenous vein from proximal calf to near the saphenofemoral junction performed under local tumescent anesthesia    Plan:     Return in 1 week for venous duplex exam to confirm closure left great saphenous vein and this will complete patient's treatment regimen

## 2016-04-13 ENCOUNTER — Telehealth: Payer: Self-pay | Admitting: *Deleted

## 2016-04-13 ENCOUNTER — Encounter: Payer: Self-pay | Admitting: Vascular Surgery

## 2016-04-13 NOTE — Telephone Encounter (Signed)
Pt doing well and following all the instructions. Reminded him of his fu appts.

## 2016-04-14 ENCOUNTER — Encounter: Payer: Self-pay | Admitting: Vascular Surgery

## 2016-04-15 DIAGNOSIS — S91105A Unspecified open wound of left lesser toe(s) without damage to nail, initial encounter: Secondary | ICD-10-CM | POA: Diagnosis not present

## 2016-04-15 DIAGNOSIS — S91109A Unspecified open wound of unspecified toe(s) without damage to nail, initial encounter: Secondary | ICD-10-CM | POA: Diagnosis not present

## 2016-04-15 DIAGNOSIS — B353 Tinea pedis: Secondary | ICD-10-CM | POA: Diagnosis not present

## 2016-04-15 DIAGNOSIS — G4730 Sleep apnea, unspecified: Secondary | ICD-10-CM | POA: Diagnosis not present

## 2016-04-15 DIAGNOSIS — L97821 Non-pressure chronic ulcer of other part of left lower leg limited to breakdown of skin: Secondary | ICD-10-CM | POA: Diagnosis not present

## 2016-04-15 DIAGNOSIS — I87332 Chronic venous hypertension (idiopathic) with ulcer and inflammation of left lower extremity: Secondary | ICD-10-CM | POA: Diagnosis not present

## 2016-04-15 DIAGNOSIS — I10 Essential (primary) hypertension: Secondary | ICD-10-CM | POA: Diagnosis not present

## 2016-04-15 DIAGNOSIS — Z79899 Other long term (current) drug therapy: Secondary | ICD-10-CM | POA: Diagnosis not present

## 2016-04-15 DIAGNOSIS — G82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, complete: Secondary | ICD-10-CM | POA: Diagnosis not present

## 2016-04-15 DIAGNOSIS — L89893 Pressure ulcer of other site, stage 3: Secondary | ICD-10-CM | POA: Diagnosis not present

## 2016-04-15 DIAGNOSIS — X58XXXA Exposure to other specified factors, initial encounter: Secondary | ICD-10-CM | POA: Diagnosis not present

## 2016-04-21 ENCOUNTER — Ambulatory Visit (HOSPITAL_COMMUNITY)
Admission: RE | Admit: 2016-04-21 | Discharge: 2016-04-21 | Disposition: A | Discharge status: Home / Self Care | Payer: BLUE CROSS/BLUE SHIELD | Source: Ambulatory Visit | Attending: Vascular Surgery | Admitting: Vascular Surgery

## 2016-04-21 ENCOUNTER — Encounter: Payer: Self-pay | Admitting: Vascular Surgery

## 2016-04-21 ENCOUNTER — Ambulatory Visit (INDEPENDENT_AMBULATORY_CARE_PROVIDER_SITE_OTHER): Payer: BLUE CROSS/BLUE SHIELD | Admitting: Vascular Surgery

## 2016-04-21 VITALS — BP 119/68 | HR 115 | Temp 99.2°F | Resp 16 | Ht 70.0 in | Wt 260.0 lb

## 2016-04-21 DIAGNOSIS — I83892 Varicose veins of left lower extremities with other complications: Secondary | ICD-10-CM | POA: Diagnosis not present

## 2016-04-21 DIAGNOSIS — I82532 Chronic embolism and thrombosis of left popliteal vein: Secondary | ICD-10-CM | POA: Diagnosis not present

## 2016-04-21 DIAGNOSIS — Z9889 Other specified postprocedural states: Secondary | ICD-10-CM | POA: Diagnosis not present

## 2016-04-21 DIAGNOSIS — L97229 Non-pressure chronic ulcer of left calf with unspecified severity: Secondary | ICD-10-CM

## 2016-04-21 DIAGNOSIS — I83022 Varicose veins of left lower extremity with ulcer of calf: Secondary | ICD-10-CM | POA: Diagnosis not present

## 2016-04-21 DIAGNOSIS — G4733 Obstructive sleep apnea (adult) (pediatric): Secondary | ICD-10-CM | POA: Insufficient documentation

## 2016-04-21 NOTE — Progress Notes (Signed)
Patient name: [name] MRN: [id number] DOB: 1986/03/07 Sex: male  REASON FOR VISIT: One-week follow-up after left leg laser ablation by Dr. Kellie Simmering.  HPI: [name] is a 30 y.o. male here today for follow-up after left great saphenous vein laser ablation on 04/12/2016. Patient is a paraplegic and wheelchair with severe venous ulcerations on his left leg being taken care of in the wound center care center. He underwent a laser ablation of his left great saphenous vein for correction of superficial venous hypertension on 04/12/2016. He does not have any sensation in his lower extremities and had no pain.  Current Outpatient Prescriptions  Medication Sig Dispense Refill  . imipramine (TOFRANIL) 50 MG tablet Take 50 mg by mouth at bedtime.     [name] lisinopril-hydrochlorothiazide (PRINZIDE,ZESTORETIC) 20-25 MG per tablet Take 1 tablet by mouth daily.    [name] oxybutynin (DITROPAN XL) 15 MG 24 hr tablet Take 30 mg by mouth at bedtime.     . sertraline (ZOLOFT) 50 MG tablet Take 50 mg by mouth every morning.    . baclofen (LIORESAL) 20 MG tablet Take 40-60 mg by mouth 3 (three) times daily. 60mg  in the morning, 40mg  in the afternoon, and 60mg  in the evening    . cadexomer iodine (IODOSORB) 0.9 % gel Apply 1 application topically daily as needed for wound care. (Patient not taking: Reported on 04/21/2016) 40 g 2  . clindamycin (CLEOCIN) 300 MG capsule Take 1 capsule (300 mg total) by mouth 3 (three) times daily. (Patient not taking: Reported on 04/21/2016) 9 capsule 0  . diazepam (VALIUM) 10 MG tablet Take 1 tablet (10 mg total) by mouth at bedtime. (Patient not taking: Reported on 04/21/2016) 30 tablet   . doxycycline (VIBRA-TABS) 100 MG tablet Take 1 tablet (100 mg total) by mouth 2 (two) times daily. (Patient not taking: Reported on 04/21/2016) 60 tablet 1  . fosfomycin (MONUROL) 3 G PACK Take 3 g by mouth once. (Patient not taking: Reported on 04/21/2016) 1 g 2  . loratadine (CLARITIN) 10 MG tablet Take  10 mg by mouth daily as needed for allergies. Reported on 04/21/2016    . Multiple Vitamin (MULTIVITAMIN) tablet Take 1 tablet by mouth daily.    . nitrofurantoin, macrocrystal-monohydrate, (MACROBID) 100 MG capsule Take 1 capsule (100 mg total) by mouth 2 (two) times daily. (Patient not taking: Reported on 04/21/2016) 14 capsule 1   No current facility-administered medications for this visit.    REVIEW OF SYSTEMS:  [X]  denotes positive finding, [ ]  denotes negative finding Cardiac  Comments:  Chest pain or chest pressure:    Shortness of breath upon exertion:    Short of breath when lying flat:    Irregular heart rhythm:    Constitutional    Fever or chills:      PHYSICAL EXAM: Filed Vitals:   04/21/16 1438  BP: 119/68  Pulse: 115  Temp: 99.2 F (37.3 C)  TempSrc: Oral  Resp: 16  Height: 5\' 10"  (1.778 m)  Weight: 260 lb (117.935 kg)  SpO2: 95%    GENERAL: The patient is a well-nourished male, in no acute distress. The vital signs are documented above. Patient is sitting in a wheelchair. Alert and oriented. No bruising in his left thigh and no skin irritation. Compression dressings intact on his foot and ankle.  Venous duplex today reveals closure of his left great saphenous vein from the distal knee revision to a 1.9 cm from the saphenofemoral junction. No evidence of  acute DVT. 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 He will continue follow-up with wound center and will see [place] again on as-needed basis  Early, [facility] [employer] [phone]

## 2016-04-22 ENCOUNTER — Encounter (HOSPITAL_BASED_OUTPATIENT_CLINIC_OR_DEPARTMENT_OTHER): Payer: BLUE CROSS/BLUE SHIELD | Attending: Internal Medicine

## 2016-04-22 DIAGNOSIS — I10 Essential (primary) hypertension: Secondary | ICD-10-CM | POA: Diagnosis not present

## 2016-04-22 DIAGNOSIS — X58XXXA Exposure to other specified factors, initial encounter: Secondary | ICD-10-CM | POA: Insufficient documentation

## 2016-04-22 DIAGNOSIS — I87332 Chronic venous hypertension (idiopathic) with ulcer and inflammation of left lower extremity: Secondary | ICD-10-CM | POA: Diagnosis not present

## 2016-04-22 DIAGNOSIS — L89899 Pressure ulcer of other site, unspecified stage: Secondary | ICD-10-CM | POA: Diagnosis not present

## 2016-04-22 DIAGNOSIS — G4730 Sleep apnea, unspecified: Secondary | ICD-10-CM | POA: Insufficient documentation

## 2016-04-22 DIAGNOSIS — S91105A Unspecified open wound of left lesser toe(s) without damage to nail, initial encounter: Secondary | ICD-10-CM | POA: Diagnosis not present

## 2016-04-22 DIAGNOSIS — G8220 Paraplegia, unspecified: Secondary | ICD-10-CM | POA: Insufficient documentation

## 2016-04-22 DIAGNOSIS — G82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, complete: Secondary | ICD-10-CM | POA: Diagnosis not present

## 2016-04-22 DIAGNOSIS — S91104A Unspecified open wound of right lesser toe(s) without damage to nail, initial encounter: Secondary | ICD-10-CM | POA: Diagnosis not present

## 2016-04-22 DIAGNOSIS — L97521 Non-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: Secondary | ICD-10-CM | POA: Insufficient documentation

## 2016-04-22 DIAGNOSIS — L97821 Non-pressure chronic ulcer of other part of left lower leg limited to breakdown of skin: Secondary | ICD-10-CM | POA: Diagnosis not present

## 2016-04-26 DIAGNOSIS — L97821 Non-pressure chronic ulcer of other part of left lower leg limited to breakdown of skin: Secondary | ICD-10-CM | POA: Diagnosis not present

## 2016-04-26 DIAGNOSIS — L89899 Pressure ulcer of other site, unspecified stage: Secondary | ICD-10-CM | POA: Diagnosis not present

## 2016-04-26 DIAGNOSIS — G82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, complete: Secondary | ICD-10-CM | POA: Diagnosis not present

## 2016-04-26 DIAGNOSIS — G4730 Sleep apnea, unspecified: Secondary | ICD-10-CM | POA: Diagnosis not present

## 2016-04-26 DIAGNOSIS — S91105A Unspecified open wound of left lesser toe(s) without damage to nail, initial encounter: Secondary | ICD-10-CM | POA: Diagnosis not present

## 2016-04-26 DIAGNOSIS — I87332 Chronic venous hypertension (idiopathic) with ulcer and inflammation of left lower extremity: Secondary | ICD-10-CM | POA: Diagnosis not present

## 2016-04-26 DIAGNOSIS — S91104A Unspecified open wound of right lesser toe(s) without damage to nail, initial encounter: Secondary | ICD-10-CM | POA: Diagnosis not present

## 2016-04-26 DIAGNOSIS — L97521 Non-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: Secondary | ICD-10-CM | POA: Diagnosis not present

## 2016-04-26 DIAGNOSIS — I10 Essential (primary) hypertension: Secondary | ICD-10-CM | POA: Diagnosis not present

## 2016-04-26 DIAGNOSIS — G8220 Paraplegia, unspecified: Secondary | ICD-10-CM | POA: Diagnosis not present

## 2016-04-26 DIAGNOSIS — X58XXXA Exposure to other specified factors, initial encounter: Secondary | ICD-10-CM | POA: Diagnosis not present

## 2016-04-29 DIAGNOSIS — S91104A Unspecified open wound of right lesser toe(s) without damage to nail, initial encounter: Secondary | ICD-10-CM | POA: Diagnosis not present

## 2016-04-29 DIAGNOSIS — G82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, complete: Secondary | ICD-10-CM | POA: Diagnosis not present

## 2016-04-29 DIAGNOSIS — I87332 Chronic venous hypertension (idiopathic) with ulcer and inflammation of left lower extremity: Secondary | ICD-10-CM | POA: Diagnosis not present

## 2016-04-29 DIAGNOSIS — X58XXXA Exposure to other specified factors, initial encounter: Secondary | ICD-10-CM | POA: Diagnosis not present

## 2016-04-29 DIAGNOSIS — I10 Essential (primary) hypertension: Secondary | ICD-10-CM | POA: Diagnosis not present

## 2016-04-29 DIAGNOSIS — G4730 Sleep apnea, unspecified: Secondary | ICD-10-CM | POA: Diagnosis not present

## 2016-04-29 DIAGNOSIS — L89899 Pressure ulcer of other site, unspecified stage: Secondary | ICD-10-CM | POA: Diagnosis not present

## 2016-04-29 DIAGNOSIS — G8220 Paraplegia, unspecified: Secondary | ICD-10-CM | POA: Diagnosis not present

## 2016-04-29 DIAGNOSIS — L97521 Non-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: Secondary | ICD-10-CM | POA: Diagnosis not present

## 2016-05-03 DIAGNOSIS — I87332 Chronic venous hypertension (idiopathic) with ulcer and inflammation of left lower extremity: Secondary | ICD-10-CM | POA: Diagnosis not present

## 2016-05-03 DIAGNOSIS — L97821 Non-pressure chronic ulcer of other part of left lower leg limited to breakdown of skin: Secondary | ICD-10-CM | POA: Diagnosis not present

## 2016-05-03 DIAGNOSIS — L89899 Pressure ulcer of other site, unspecified stage: Secondary | ICD-10-CM | POA: Diagnosis not present

## 2016-05-03 DIAGNOSIS — X58XXXA Exposure to other specified factors, initial encounter: Secondary | ICD-10-CM | POA: Diagnosis not present

## 2016-05-03 DIAGNOSIS — S91104A Unspecified open wound of right lesser toe(s) without damage to nail, initial encounter: Secondary | ICD-10-CM | POA: Diagnosis not present

## 2016-05-03 DIAGNOSIS — I70248 Atherosclerosis of native arteries of left leg with ulceration of other part of lower left leg: Secondary | ICD-10-CM | POA: Diagnosis not present

## 2016-05-03 DIAGNOSIS — G4730 Sleep apnea, unspecified: Secondary | ICD-10-CM | POA: Diagnosis not present

## 2016-05-03 DIAGNOSIS — L97521 Non-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: Secondary | ICD-10-CM | POA: Diagnosis not present

## 2016-05-03 DIAGNOSIS — G82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, complete: Secondary | ICD-10-CM | POA: Diagnosis not present

## 2016-05-03 DIAGNOSIS — I10 Essential (primary) hypertension: Secondary | ICD-10-CM | POA: Diagnosis not present

## 2016-05-03 DIAGNOSIS — G8220 Paraplegia, unspecified: Secondary | ICD-10-CM | POA: Diagnosis not present

## 2016-05-06 DIAGNOSIS — L89899 Pressure ulcer of other site, unspecified stage: Secondary | ICD-10-CM | POA: Diagnosis not present

## 2016-05-06 DIAGNOSIS — S91104A Unspecified open wound of right lesser toe(s) without damage to nail, initial encounter: Secondary | ICD-10-CM | POA: Diagnosis not present

## 2016-05-06 DIAGNOSIS — I87332 Chronic venous hypertension (idiopathic) with ulcer and inflammation of left lower extremity: Secondary | ICD-10-CM | POA: Diagnosis not present

## 2016-05-06 DIAGNOSIS — L97521 Non-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: Secondary | ICD-10-CM | POA: Diagnosis not present

## 2016-05-06 DIAGNOSIS — X58XXXA Exposure to other specified factors, initial encounter: Secondary | ICD-10-CM | POA: Diagnosis not present

## 2016-05-06 DIAGNOSIS — G82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, complete: Secondary | ICD-10-CM | POA: Diagnosis not present

## 2016-05-06 DIAGNOSIS — I10 Essential (primary) hypertension: Secondary | ICD-10-CM | POA: Diagnosis not present

## 2016-05-06 DIAGNOSIS — G8220 Paraplegia, unspecified: Secondary | ICD-10-CM | POA: Diagnosis not present

## 2016-05-06 DIAGNOSIS — G4730 Sleep apnea, unspecified: Secondary | ICD-10-CM | POA: Diagnosis not present

## 2016-05-10 DIAGNOSIS — G82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, complete: Secondary | ICD-10-CM | POA: Diagnosis not present

## 2016-05-10 DIAGNOSIS — S91104A Unspecified open wound of right lesser toe(s) without damage to nail, initial encounter: Secondary | ICD-10-CM | POA: Diagnosis not present

## 2016-05-10 DIAGNOSIS — I87332 Chronic venous hypertension (idiopathic) with ulcer and inflammation of left lower extremity: Secondary | ICD-10-CM | POA: Diagnosis not present

## 2016-05-10 DIAGNOSIS — L97821 Non-pressure chronic ulcer of other part of left lower leg limited to breakdown of skin: Secondary | ICD-10-CM | POA: Diagnosis not present

## 2016-05-10 DIAGNOSIS — L89899 Pressure ulcer of other site, unspecified stage: Secondary | ICD-10-CM | POA: Diagnosis not present

## 2016-05-10 DIAGNOSIS — G8220 Paraplegia, unspecified: Secondary | ICD-10-CM | POA: Diagnosis not present

## 2016-05-10 DIAGNOSIS — G4730 Sleep apnea, unspecified: Secondary | ICD-10-CM | POA: Diagnosis not present

## 2016-05-10 DIAGNOSIS — I10 Essential (primary) hypertension: Secondary | ICD-10-CM | POA: Diagnosis not present

## 2016-05-10 DIAGNOSIS — I87312 Chronic venous hypertension (idiopathic) with ulcer of left lower extremity: Secondary | ICD-10-CM | POA: Diagnosis not present

## 2016-05-10 DIAGNOSIS — X58XXXA Exposure to other specified factors, initial encounter: Secondary | ICD-10-CM | POA: Diagnosis not present

## 2016-05-10 DIAGNOSIS — L97521 Non-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: Secondary | ICD-10-CM | POA: Diagnosis not present

## 2016-05-12 DIAGNOSIS — F331 Major depressive disorder, recurrent, moderate: Secondary | ICD-10-CM | POA: Diagnosis not present

## 2016-05-17 DIAGNOSIS — L89899 Pressure ulcer of other site, unspecified stage: Secondary | ICD-10-CM | POA: Diagnosis not present

## 2016-05-17 DIAGNOSIS — S91104A Unspecified open wound of right lesser toe(s) without damage to nail, initial encounter: Secondary | ICD-10-CM | POA: Diagnosis not present

## 2016-05-17 DIAGNOSIS — I87332 Chronic venous hypertension (idiopathic) with ulcer and inflammation of left lower extremity: Secondary | ICD-10-CM | POA: Diagnosis not present

## 2016-05-17 DIAGNOSIS — G82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, complete: Secondary | ICD-10-CM | POA: Diagnosis not present

## 2016-05-17 DIAGNOSIS — X58XXXA Exposure to other specified factors, initial encounter: Secondary | ICD-10-CM | POA: Diagnosis not present

## 2016-05-17 DIAGNOSIS — G8220 Paraplegia, unspecified: Secondary | ICD-10-CM | POA: Diagnosis not present

## 2016-05-17 DIAGNOSIS — L97521 Non-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: Secondary | ICD-10-CM | POA: Diagnosis not present

## 2016-05-17 DIAGNOSIS — I10 Essential (primary) hypertension: Secondary | ICD-10-CM | POA: Diagnosis not present

## 2016-05-17 DIAGNOSIS — G4730 Sleep apnea, unspecified: Secondary | ICD-10-CM | POA: Diagnosis not present

## 2016-05-20 DIAGNOSIS — L97521 Non-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: Secondary | ICD-10-CM | POA: Diagnosis not present

## 2016-05-20 DIAGNOSIS — L89899 Pressure ulcer of other site, unspecified stage: Secondary | ICD-10-CM | POA: Diagnosis not present

## 2016-05-20 DIAGNOSIS — X58XXXA Exposure to other specified factors, initial encounter: Secondary | ICD-10-CM | POA: Diagnosis not present

## 2016-05-20 DIAGNOSIS — G4730 Sleep apnea, unspecified: Secondary | ICD-10-CM | POA: Diagnosis not present

## 2016-05-20 DIAGNOSIS — G8220 Paraplegia, unspecified: Secondary | ICD-10-CM | POA: Diagnosis not present

## 2016-05-20 DIAGNOSIS — S91104A Unspecified open wound of right lesser toe(s) without damage to nail, initial encounter: Secondary | ICD-10-CM | POA: Diagnosis not present

## 2016-05-20 DIAGNOSIS — I87332 Chronic venous hypertension (idiopathic) with ulcer and inflammation of left lower extremity: Secondary | ICD-10-CM | POA: Diagnosis not present

## 2016-05-20 DIAGNOSIS — I10 Essential (primary) hypertension: Secondary | ICD-10-CM | POA: Diagnosis not present

## 2016-05-20 DIAGNOSIS — G82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, complete: Secondary | ICD-10-CM | POA: Diagnosis not present

## 2016-05-24 ENCOUNTER — Encounter (HOSPITAL_BASED_OUTPATIENT_CLINIC_OR_DEPARTMENT_OTHER): Payer: BLUE CROSS/BLUE SHIELD | Attending: Internal Medicine

## 2016-05-24 DIAGNOSIS — I87332 Chronic venous hypertension (idiopathic) with ulcer and inflammation of left lower extremity: Secondary | ICD-10-CM | POA: Diagnosis not present

## 2016-05-24 DIAGNOSIS — G8220 Paraplegia, unspecified: Secondary | ICD-10-CM | POA: Diagnosis not present

## 2016-05-24 DIAGNOSIS — I10 Essential (primary) hypertension: Secondary | ICD-10-CM | POA: Diagnosis not present

## 2016-05-24 DIAGNOSIS — G4730 Sleep apnea, unspecified: Secondary | ICD-10-CM | POA: Insufficient documentation

## 2016-05-24 DIAGNOSIS — L97821 Non-pressure chronic ulcer of other part of left lower leg limited to breakdown of skin: Secondary | ICD-10-CM | POA: Diagnosis not present

## 2016-05-24 DIAGNOSIS — L97522 Non-pressure chronic ulcer of other part of left foot with fat layer exposed: Secondary | ICD-10-CM | POA: Diagnosis not present

## 2016-05-24 DIAGNOSIS — L89893 Pressure ulcer of other site, stage 3: Secondary | ICD-10-CM | POA: Insufficient documentation

## 2016-05-24 DIAGNOSIS — L97521 Non-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: Secondary | ICD-10-CM | POA: Diagnosis not present

## 2016-05-24 DIAGNOSIS — B49 Unspecified mycosis: Secondary | ICD-10-CM | POA: Diagnosis not present

## 2016-05-24 DIAGNOSIS — S81802A Unspecified open wound, left lower leg, initial encounter: Secondary | ICD-10-CM | POA: Diagnosis not present

## 2016-05-28 DIAGNOSIS — L97821 Non-pressure chronic ulcer of other part of left lower leg limited to breakdown of skin: Secondary | ICD-10-CM | POA: Diagnosis not present

## 2016-05-28 DIAGNOSIS — I10 Essential (primary) hypertension: Secondary | ICD-10-CM | POA: Diagnosis not present

## 2016-05-28 DIAGNOSIS — L97522 Non-pressure chronic ulcer of other part of left foot with fat layer exposed: Secondary | ICD-10-CM | POA: Diagnosis not present

## 2016-05-28 DIAGNOSIS — L97521 Non-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: Secondary | ICD-10-CM | POA: Diagnosis not present

## 2016-05-28 DIAGNOSIS — G4730 Sleep apnea, unspecified: Secondary | ICD-10-CM | POA: Diagnosis not present

## 2016-05-28 DIAGNOSIS — G8220 Paraplegia, unspecified: Secondary | ICD-10-CM | POA: Diagnosis not present

## 2016-05-28 DIAGNOSIS — I87332 Chronic venous hypertension (idiopathic) with ulcer and inflammation of left lower extremity: Secondary | ICD-10-CM | POA: Diagnosis not present

## 2016-05-28 DIAGNOSIS — L89893 Pressure ulcer of other site, stage 3: Secondary | ICD-10-CM | POA: Diagnosis not present

## 2016-05-31 DIAGNOSIS — L97521 Non-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: Secondary | ICD-10-CM | POA: Diagnosis not present

## 2016-05-31 DIAGNOSIS — G8220 Paraplegia, unspecified: Secondary | ICD-10-CM | POA: Diagnosis not present

## 2016-05-31 DIAGNOSIS — L89893 Pressure ulcer of other site, stage 3: Secondary | ICD-10-CM | POA: Diagnosis not present

## 2016-05-31 DIAGNOSIS — L97821 Non-pressure chronic ulcer of other part of left lower leg limited to breakdown of skin: Secondary | ICD-10-CM | POA: Diagnosis not present

## 2016-05-31 DIAGNOSIS — I87332 Chronic venous hypertension (idiopathic) with ulcer and inflammation of left lower extremity: Secondary | ICD-10-CM | POA: Diagnosis not present

## 2016-05-31 DIAGNOSIS — L97522 Non-pressure chronic ulcer of other part of left foot with fat layer exposed: Secondary | ICD-10-CM | POA: Diagnosis not present

## 2016-05-31 DIAGNOSIS — I10 Essential (primary) hypertension: Secondary | ICD-10-CM | POA: Diagnosis not present

## 2016-05-31 DIAGNOSIS — G4730 Sleep apnea, unspecified: Secondary | ICD-10-CM | POA: Diagnosis not present

## 2016-06-03 DIAGNOSIS — R8290 Unspecified abnormal findings in urine: Secondary | ICD-10-CM | POA: Diagnosis not present

## 2016-06-03 DIAGNOSIS — G4730 Sleep apnea, unspecified: Secondary | ICD-10-CM | POA: Diagnosis not present

## 2016-06-03 DIAGNOSIS — L97522 Non-pressure chronic ulcer of other part of left foot with fat layer exposed: Secondary | ICD-10-CM | POA: Diagnosis not present

## 2016-06-03 DIAGNOSIS — I87332 Chronic venous hypertension (idiopathic) with ulcer and inflammation of left lower extremity: Secondary | ICD-10-CM | POA: Diagnosis not present

## 2016-06-03 DIAGNOSIS — L97821 Non-pressure chronic ulcer of other part of left lower leg limited to breakdown of skin: Secondary | ICD-10-CM | POA: Diagnosis not present

## 2016-06-03 DIAGNOSIS — L89893 Pressure ulcer of other site, stage 3: Secondary | ICD-10-CM | POA: Diagnosis not present

## 2016-06-03 DIAGNOSIS — I10 Essential (primary) hypertension: Secondary | ICD-10-CM | POA: Diagnosis not present

## 2016-06-03 DIAGNOSIS — G8220 Paraplegia, unspecified: Secondary | ICD-10-CM | POA: Diagnosis not present

## 2016-06-03 DIAGNOSIS — L97521 Non-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: Secondary | ICD-10-CM | POA: Diagnosis not present

## 2016-06-04 DIAGNOSIS — R8290 Unspecified abnormal findings in urine: Secondary | ICD-10-CM | POA: Diagnosis not present

## 2016-06-07 DIAGNOSIS — L97821 Non-pressure chronic ulcer of other part of left lower leg limited to breakdown of skin: Secondary | ICD-10-CM | POA: Diagnosis not present

## 2016-06-07 DIAGNOSIS — G8220 Paraplegia, unspecified: Secondary | ICD-10-CM | POA: Diagnosis not present

## 2016-06-07 DIAGNOSIS — L89893 Pressure ulcer of other site, stage 3: Secondary | ICD-10-CM | POA: Diagnosis not present

## 2016-06-07 DIAGNOSIS — I10 Essential (primary) hypertension: Secondary | ICD-10-CM | POA: Diagnosis not present

## 2016-06-07 DIAGNOSIS — B351 Tinea unguium: Secondary | ICD-10-CM | POA: Diagnosis not present

## 2016-06-07 DIAGNOSIS — L97522 Non-pressure chronic ulcer of other part of left foot with fat layer exposed: Secondary | ICD-10-CM | POA: Diagnosis not present

## 2016-06-07 DIAGNOSIS — G4730 Sleep apnea, unspecified: Secondary | ICD-10-CM | POA: Diagnosis not present

## 2016-06-07 DIAGNOSIS — I87332 Chronic venous hypertension (idiopathic) with ulcer and inflammation of left lower extremity: Secondary | ICD-10-CM | POA: Diagnosis not present

## 2016-06-07 DIAGNOSIS — I87312 Chronic venous hypertension (idiopathic) with ulcer of left lower extremity: Secondary | ICD-10-CM | POA: Diagnosis not present

## 2016-06-07 DIAGNOSIS — L97521 Non-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: Secondary | ICD-10-CM | POA: Diagnosis not present

## 2016-06-09 DIAGNOSIS — F331 Major depressive disorder, recurrent, moderate: Secondary | ICD-10-CM | POA: Diagnosis not present

## 2016-06-10 DIAGNOSIS — G8220 Paraplegia, unspecified: Secondary | ICD-10-CM | POA: Diagnosis not present

## 2016-06-10 DIAGNOSIS — I87332 Chronic venous hypertension (idiopathic) with ulcer and inflammation of left lower extremity: Secondary | ICD-10-CM | POA: Diagnosis not present

## 2016-06-10 DIAGNOSIS — L97522 Non-pressure chronic ulcer of other part of left foot with fat layer exposed: Secondary | ICD-10-CM | POA: Diagnosis not present

## 2016-06-10 DIAGNOSIS — L97821 Non-pressure chronic ulcer of other part of left lower leg limited to breakdown of skin: Secondary | ICD-10-CM | POA: Diagnosis not present

## 2016-06-10 DIAGNOSIS — G4730 Sleep apnea, unspecified: Secondary | ICD-10-CM | POA: Diagnosis not present

## 2016-06-10 DIAGNOSIS — L97521 Non-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: Secondary | ICD-10-CM | POA: Diagnosis not present

## 2016-06-10 DIAGNOSIS — I10 Essential (primary) hypertension: Secondary | ICD-10-CM | POA: Diagnosis not present

## 2016-06-10 DIAGNOSIS — L89893 Pressure ulcer of other site, stage 3: Secondary | ICD-10-CM | POA: Diagnosis not present

## 2016-06-14 DIAGNOSIS — B351 Tinea unguium: Secondary | ICD-10-CM | POA: Diagnosis not present

## 2016-06-14 DIAGNOSIS — L97522 Non-pressure chronic ulcer of other part of left foot with fat layer exposed: Secondary | ICD-10-CM | POA: Diagnosis not present

## 2016-06-14 DIAGNOSIS — I10 Essential (primary) hypertension: Secondary | ICD-10-CM | POA: Diagnosis not present

## 2016-06-14 DIAGNOSIS — L97821 Non-pressure chronic ulcer of other part of left lower leg limited to breakdown of skin: Secondary | ICD-10-CM | POA: Diagnosis not present

## 2016-06-14 DIAGNOSIS — L97521 Non-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: Secondary | ICD-10-CM | POA: Diagnosis not present

## 2016-06-14 DIAGNOSIS — I87312 Chronic venous hypertension (idiopathic) with ulcer of left lower extremity: Secondary | ICD-10-CM | POA: Diagnosis not present

## 2016-06-14 DIAGNOSIS — G4730 Sleep apnea, unspecified: Secondary | ICD-10-CM | POA: Diagnosis not present

## 2016-06-14 DIAGNOSIS — I87332 Chronic venous hypertension (idiopathic) with ulcer and inflammation of left lower extremity: Secondary | ICD-10-CM | POA: Diagnosis not present

## 2016-06-14 DIAGNOSIS — L89893 Pressure ulcer of other site, stage 3: Secondary | ICD-10-CM | POA: Diagnosis not present

## 2016-06-14 DIAGNOSIS — G8220 Paraplegia, unspecified: Secondary | ICD-10-CM | POA: Diagnosis not present

## 2016-06-18 DIAGNOSIS — G8220 Paraplegia, unspecified: Secondary | ICD-10-CM | POA: Diagnosis not present

## 2016-06-18 DIAGNOSIS — L97522 Non-pressure chronic ulcer of other part of left foot with fat layer exposed: Secondary | ICD-10-CM | POA: Diagnosis not present

## 2016-06-18 DIAGNOSIS — I10 Essential (primary) hypertension: Secondary | ICD-10-CM | POA: Diagnosis not present

## 2016-06-18 DIAGNOSIS — L97821 Non-pressure chronic ulcer of other part of left lower leg limited to breakdown of skin: Secondary | ICD-10-CM | POA: Diagnosis not present

## 2016-06-18 DIAGNOSIS — I87332 Chronic venous hypertension (idiopathic) with ulcer and inflammation of left lower extremity: Secondary | ICD-10-CM | POA: Diagnosis not present

## 2016-06-18 DIAGNOSIS — L89893 Pressure ulcer of other site, stage 3: Secondary | ICD-10-CM | POA: Diagnosis not present

## 2016-06-18 DIAGNOSIS — L97521 Non-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: Secondary | ICD-10-CM | POA: Diagnosis not present

## 2016-06-18 DIAGNOSIS — G4730 Sleep apnea, unspecified: Secondary | ICD-10-CM | POA: Diagnosis not present

## 2016-06-21 DIAGNOSIS — G8220 Paraplegia, unspecified: Secondary | ICD-10-CM | POA: Diagnosis not present

## 2016-06-21 DIAGNOSIS — I87332 Chronic venous hypertension (idiopathic) with ulcer and inflammation of left lower extremity: Secondary | ICD-10-CM | POA: Diagnosis not present

## 2016-06-21 DIAGNOSIS — L97522 Non-pressure chronic ulcer of other part of left foot with fat layer exposed: Secondary | ICD-10-CM | POA: Diagnosis not present

## 2016-06-21 DIAGNOSIS — L97821 Non-pressure chronic ulcer of other part of left lower leg limited to breakdown of skin: Secondary | ICD-10-CM | POA: Diagnosis not present

## 2016-06-21 DIAGNOSIS — I10 Essential (primary) hypertension: Secondary | ICD-10-CM | POA: Diagnosis not present

## 2016-06-21 DIAGNOSIS — L97822 Non-pressure chronic ulcer of other part of left lower leg with fat layer exposed: Secondary | ICD-10-CM | POA: Diagnosis not present

## 2016-06-21 DIAGNOSIS — L89893 Pressure ulcer of other site, stage 3: Secondary | ICD-10-CM | POA: Diagnosis not present

## 2016-06-21 DIAGNOSIS — G4730 Sleep apnea, unspecified: Secondary | ICD-10-CM | POA: Diagnosis not present

## 2016-06-21 DIAGNOSIS — L97521 Non-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: Secondary | ICD-10-CM | POA: Diagnosis not present

## 2016-06-21 DIAGNOSIS — I87312 Chronic venous hypertension (idiopathic) with ulcer of left lower extremity: Secondary | ICD-10-CM | POA: Diagnosis not present

## 2016-06-24 ENCOUNTER — Encounter (HOSPITAL_BASED_OUTPATIENT_CLINIC_OR_DEPARTMENT_OTHER): Payer: BLUE CROSS/BLUE SHIELD | Attending: Internal Medicine

## 2016-06-24 DIAGNOSIS — I10 Essential (primary) hypertension: Secondary | ICD-10-CM | POA: Insufficient documentation

## 2016-06-24 DIAGNOSIS — Y939 Activity, unspecified: Secondary | ICD-10-CM | POA: Insufficient documentation

## 2016-06-24 DIAGNOSIS — S91302A Unspecified open wound, left foot, initial encounter: Secondary | ICD-10-CM | POA: Diagnosis not present

## 2016-06-24 DIAGNOSIS — X58XXXA Exposure to other specified factors, initial encounter: Secondary | ICD-10-CM | POA: Insufficient documentation

## 2016-06-24 DIAGNOSIS — G82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, complete: Secondary | ICD-10-CM | POA: Insufficient documentation

## 2016-06-24 DIAGNOSIS — G4730 Sleep apnea, unspecified: Secondary | ICD-10-CM | POA: Diagnosis not present

## 2016-06-24 DIAGNOSIS — L89899 Pressure ulcer of other site, unspecified stage: Secondary | ICD-10-CM | POA: Insufficient documentation

## 2016-06-28 DIAGNOSIS — G4730 Sleep apnea, unspecified: Secondary | ICD-10-CM | POA: Diagnosis not present

## 2016-06-28 DIAGNOSIS — L89899 Pressure ulcer of other site, unspecified stage: Secondary | ICD-10-CM | POA: Diagnosis not present

## 2016-06-28 DIAGNOSIS — I10 Essential (primary) hypertension: Secondary | ICD-10-CM | POA: Diagnosis not present

## 2016-06-28 DIAGNOSIS — I87332 Chronic venous hypertension (idiopathic) with ulcer and inflammation of left lower extremity: Secondary | ICD-10-CM | POA: Diagnosis not present

## 2016-06-28 DIAGNOSIS — S91302A Unspecified open wound, left foot, initial encounter: Secondary | ICD-10-CM | POA: Diagnosis not present

## 2016-06-28 DIAGNOSIS — Y939 Activity, unspecified: Secondary | ICD-10-CM | POA: Diagnosis not present

## 2016-06-28 DIAGNOSIS — S91302D Unspecified open wound, left foot, subsequent encounter: Secondary | ICD-10-CM | POA: Diagnosis not present

## 2016-06-28 DIAGNOSIS — X58XXXA Exposure to other specified factors, initial encounter: Secondary | ICD-10-CM | POA: Diagnosis not present

## 2016-06-28 DIAGNOSIS — L97822 Non-pressure chronic ulcer of other part of left lower leg with fat layer exposed: Secondary | ICD-10-CM | POA: Diagnosis not present

## 2016-06-28 DIAGNOSIS — G82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, complete: Secondary | ICD-10-CM | POA: Diagnosis not present

## 2016-07-01 DIAGNOSIS — L89899 Pressure ulcer of other site, unspecified stage: Secondary | ICD-10-CM | POA: Diagnosis not present

## 2016-07-01 DIAGNOSIS — G82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, complete: Secondary | ICD-10-CM | POA: Diagnosis not present

## 2016-07-01 DIAGNOSIS — X58XXXA Exposure to other specified factors, initial encounter: Secondary | ICD-10-CM | POA: Diagnosis not present

## 2016-07-01 DIAGNOSIS — Y939 Activity, unspecified: Secondary | ICD-10-CM | POA: Diagnosis not present

## 2016-07-01 DIAGNOSIS — I10 Essential (primary) hypertension: Secondary | ICD-10-CM | POA: Diagnosis not present

## 2016-07-01 DIAGNOSIS — S91302A Unspecified open wound, left foot, initial encounter: Secondary | ICD-10-CM | POA: Diagnosis not present

## 2016-07-01 DIAGNOSIS — G4730 Sleep apnea, unspecified: Secondary | ICD-10-CM | POA: Diagnosis not present

## 2016-07-05 DIAGNOSIS — X58XXXA Exposure to other specified factors, initial encounter: Secondary | ICD-10-CM | POA: Diagnosis not present

## 2016-07-05 DIAGNOSIS — L89899 Pressure ulcer of other site, unspecified stage: Secondary | ICD-10-CM | POA: Diagnosis not present

## 2016-07-05 DIAGNOSIS — I10 Essential (primary) hypertension: Secondary | ICD-10-CM | POA: Diagnosis not present

## 2016-07-05 DIAGNOSIS — I87332 Chronic venous hypertension (idiopathic) with ulcer and inflammation of left lower extremity: Secondary | ICD-10-CM | POA: Diagnosis not present

## 2016-07-05 DIAGNOSIS — S91105A Unspecified open wound of left lesser toe(s) without damage to nail, initial encounter: Secondary | ICD-10-CM | POA: Diagnosis not present

## 2016-07-05 DIAGNOSIS — L97822 Non-pressure chronic ulcer of other part of left lower leg with fat layer exposed: Secondary | ICD-10-CM | POA: Diagnosis not present

## 2016-07-05 DIAGNOSIS — Y939 Activity, unspecified: Secondary | ICD-10-CM | POA: Diagnosis not present

## 2016-07-05 DIAGNOSIS — G4730 Sleep apnea, unspecified: Secondary | ICD-10-CM | POA: Diagnosis not present

## 2016-07-05 DIAGNOSIS — G82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, complete: Secondary | ICD-10-CM | POA: Diagnosis not present

## 2016-07-05 DIAGNOSIS — S91302A Unspecified open wound, left foot, initial encounter: Secondary | ICD-10-CM | POA: Diagnosis not present

## 2016-07-08 DIAGNOSIS — Y939 Activity, unspecified: Secondary | ICD-10-CM | POA: Diagnosis not present

## 2016-07-08 DIAGNOSIS — X58XXXA Exposure to other specified factors, initial encounter: Secondary | ICD-10-CM | POA: Diagnosis not present

## 2016-07-08 DIAGNOSIS — G82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, complete: Secondary | ICD-10-CM | POA: Diagnosis not present

## 2016-07-08 DIAGNOSIS — G4730 Sleep apnea, unspecified: Secondary | ICD-10-CM | POA: Diagnosis not present

## 2016-07-08 DIAGNOSIS — L97821 Non-pressure chronic ulcer of other part of left lower leg limited to breakdown of skin: Secondary | ICD-10-CM | POA: Diagnosis not present

## 2016-07-08 DIAGNOSIS — L89899 Pressure ulcer of other site, unspecified stage: Secondary | ICD-10-CM | POA: Diagnosis not present

## 2016-07-08 DIAGNOSIS — B351 Tinea unguium: Secondary | ICD-10-CM | POA: Diagnosis not present

## 2016-07-08 DIAGNOSIS — S91302A Unspecified open wound, left foot, initial encounter: Secondary | ICD-10-CM | POA: Diagnosis not present

## 2016-07-08 DIAGNOSIS — I87312 Chronic venous hypertension (idiopathic) with ulcer of left lower extremity: Secondary | ICD-10-CM | POA: Diagnosis not present

## 2016-07-08 DIAGNOSIS — I10 Essential (primary) hypertension: Secondary | ICD-10-CM | POA: Diagnosis not present

## 2016-07-12 DIAGNOSIS — L89899 Pressure ulcer of other site, unspecified stage: Secondary | ICD-10-CM | POA: Diagnosis not present

## 2016-07-12 DIAGNOSIS — I10 Essential (primary) hypertension: Secondary | ICD-10-CM | POA: Diagnosis not present

## 2016-07-12 DIAGNOSIS — S91302A Unspecified open wound, left foot, initial encounter: Secondary | ICD-10-CM | POA: Diagnosis not present

## 2016-07-12 DIAGNOSIS — L0889 Other specified local infections of the skin and subcutaneous tissue: Secondary | ICD-10-CM | POA: Diagnosis not present

## 2016-07-12 DIAGNOSIS — Y939 Activity, unspecified: Secondary | ICD-10-CM | POA: Diagnosis not present

## 2016-07-12 DIAGNOSIS — I87312 Chronic venous hypertension (idiopathic) with ulcer of left lower extremity: Secondary | ICD-10-CM | POA: Diagnosis not present

## 2016-07-12 DIAGNOSIS — G82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, complete: Secondary | ICD-10-CM | POA: Diagnosis not present

## 2016-07-12 DIAGNOSIS — G4730 Sleep apnea, unspecified: Secondary | ICD-10-CM | POA: Diagnosis not present

## 2016-07-12 DIAGNOSIS — X58XXXA Exposure to other specified factors, initial encounter: Secondary | ICD-10-CM | POA: Diagnosis not present

## 2016-07-12 DIAGNOSIS — L97821 Non-pressure chronic ulcer of other part of left lower leg limited to breakdown of skin: Secondary | ICD-10-CM | POA: Diagnosis not present

## 2016-07-16 DIAGNOSIS — I10 Essential (primary) hypertension: Secondary | ICD-10-CM | POA: Diagnosis not present

## 2016-07-16 DIAGNOSIS — X58XXXA Exposure to other specified factors, initial encounter: Secondary | ICD-10-CM | POA: Diagnosis not present

## 2016-07-16 DIAGNOSIS — G82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, complete: Secondary | ICD-10-CM | POA: Diagnosis not present

## 2016-07-16 DIAGNOSIS — G4730 Sleep apnea, unspecified: Secondary | ICD-10-CM | POA: Diagnosis not present

## 2016-07-16 DIAGNOSIS — L89899 Pressure ulcer of other site, unspecified stage: Secondary | ICD-10-CM | POA: Diagnosis not present

## 2016-07-16 DIAGNOSIS — S91302A Unspecified open wound, left foot, initial encounter: Secondary | ICD-10-CM | POA: Diagnosis not present

## 2016-07-16 DIAGNOSIS — Y939 Activity, unspecified: Secondary | ICD-10-CM | POA: Diagnosis not present

## 2016-07-19 DIAGNOSIS — X58XXXA Exposure to other specified factors, initial encounter: Secondary | ICD-10-CM | POA: Diagnosis not present

## 2016-07-19 DIAGNOSIS — Y939 Activity, unspecified: Secondary | ICD-10-CM | POA: Diagnosis not present

## 2016-07-19 DIAGNOSIS — L89899 Pressure ulcer of other site, unspecified stage: Secondary | ICD-10-CM | POA: Diagnosis not present

## 2016-07-19 DIAGNOSIS — G4730 Sleep apnea, unspecified: Secondary | ICD-10-CM | POA: Diagnosis not present

## 2016-07-19 DIAGNOSIS — I10 Essential (primary) hypertension: Secondary | ICD-10-CM | POA: Diagnosis not present

## 2016-07-19 DIAGNOSIS — L97821 Non-pressure chronic ulcer of other part of left lower leg limited to breakdown of skin: Secondary | ICD-10-CM | POA: Diagnosis not present

## 2016-07-19 DIAGNOSIS — S91302A Unspecified open wound, left foot, initial encounter: Secondary | ICD-10-CM | POA: Diagnosis not present

## 2016-07-19 DIAGNOSIS — I87312 Chronic venous hypertension (idiopathic) with ulcer of left lower extremity: Secondary | ICD-10-CM | POA: Diagnosis not present

## 2016-07-19 DIAGNOSIS — G82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, complete: Secondary | ICD-10-CM | POA: Diagnosis not present

## 2016-07-20 DIAGNOSIS — F331 Major depressive disorder, recurrent, moderate: Secondary | ICD-10-CM | POA: Diagnosis not present

## 2016-07-22 DIAGNOSIS — G82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, complete: Secondary | ICD-10-CM | POA: Diagnosis not present

## 2016-07-22 DIAGNOSIS — X58XXXA Exposure to other specified factors, initial encounter: Secondary | ICD-10-CM | POA: Diagnosis not present

## 2016-07-22 DIAGNOSIS — S91302A Unspecified open wound, left foot, initial encounter: Secondary | ICD-10-CM | POA: Diagnosis not present

## 2016-07-22 DIAGNOSIS — L89899 Pressure ulcer of other site, unspecified stage: Secondary | ICD-10-CM | POA: Diagnosis not present

## 2016-07-22 DIAGNOSIS — Y939 Activity, unspecified: Secondary | ICD-10-CM | POA: Diagnosis not present

## 2016-07-22 DIAGNOSIS — I10 Essential (primary) hypertension: Secondary | ICD-10-CM | POA: Diagnosis not present

## 2016-07-22 DIAGNOSIS — G4730 Sleep apnea, unspecified: Secondary | ICD-10-CM | POA: Diagnosis not present

## 2016-07-29 ENCOUNTER — Encounter (HOSPITAL_BASED_OUTPATIENT_CLINIC_OR_DEPARTMENT_OTHER): Payer: BLUE CROSS/BLUE SHIELD | Attending: Internal Medicine

## 2016-07-29 DIAGNOSIS — L97511 Non-pressure chronic ulcer of other part of right 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: Secondary | ICD-10-CM | POA: Insufficient documentation

## 2016-07-29 DIAGNOSIS — G82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, complete: Secondary | ICD-10-CM | POA: Diagnosis not present

## 2016-07-29 DIAGNOSIS — I10 Essential (primary) hypertension: Secondary | ICD-10-CM | POA: Insufficient documentation

## 2016-07-29 DIAGNOSIS — G4730 Sleep apnea, unspecified: Secondary | ICD-10-CM | POA: Insufficient documentation

## 2016-07-29 DIAGNOSIS — L89893 Pressure ulcer of other site, stage 3: Secondary | ICD-10-CM | POA: Insufficient documentation

## 2016-07-29 DIAGNOSIS — I87332 Chronic venous hypertension (idiopathic) with ulcer and inflammation of left lower extremity: Secondary | ICD-10-CM | POA: Insufficient documentation

## 2016-07-29 DIAGNOSIS — L97521 Non-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: Secondary | ICD-10-CM | POA: Diagnosis not present

## 2016-08-02 DIAGNOSIS — L97521 Non-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: Secondary | ICD-10-CM | POA: Diagnosis not present

## 2016-08-02 DIAGNOSIS — L97522 Non-pressure chronic ulcer of other part of left foot with fat layer exposed: Secondary | ICD-10-CM | POA: Diagnosis not present

## 2016-08-02 DIAGNOSIS — B353 Tinea pedis: Secondary | ICD-10-CM | POA: Diagnosis not present

## 2016-08-02 DIAGNOSIS — L97511 Non-pressure chronic ulcer of other part of right 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: Secondary | ICD-10-CM | POA: Diagnosis not present

## 2016-08-02 DIAGNOSIS — L89893 Pressure ulcer of other site, stage 3: Secondary | ICD-10-CM | POA: Diagnosis not present

## 2016-08-02 DIAGNOSIS — I87332 Chronic venous hypertension (idiopathic) with ulcer and inflammation of left lower extremity: Secondary | ICD-10-CM | POA: Diagnosis not present

## 2016-08-02 DIAGNOSIS — L97822 Non-pressure chronic ulcer of other part of left lower leg with fat layer exposed: Secondary | ICD-10-CM | POA: Diagnosis not present

## 2016-08-05 DIAGNOSIS — I87332 Chronic venous hypertension (idiopathic) with ulcer and inflammation of left lower extremity: Secondary | ICD-10-CM | POA: Diagnosis not present

## 2016-08-05 DIAGNOSIS — L97822 Non-pressure chronic ulcer of other part of left lower leg with fat layer exposed: Secondary | ICD-10-CM | POA: Diagnosis not present

## 2016-08-05 DIAGNOSIS — L97511 Non-pressure chronic ulcer of other part of right 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: Secondary | ICD-10-CM | POA: Diagnosis not present

## 2016-08-05 DIAGNOSIS — L89893 Pressure ulcer of other site, stage 3: Secondary | ICD-10-CM | POA: Diagnosis not present

## 2016-08-05 DIAGNOSIS — L97521 Non-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: Secondary | ICD-10-CM | POA: Diagnosis not present

## 2016-08-09 DIAGNOSIS — I87312 Chronic venous hypertension (idiopathic) with ulcer of left lower extremity: Secondary | ICD-10-CM | POA: Diagnosis not present

## 2016-08-09 DIAGNOSIS — L97521 Non-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: Secondary | ICD-10-CM | POA: Diagnosis not present

## 2016-08-09 DIAGNOSIS — L97822 Non-pressure chronic ulcer of other part of left lower leg with fat layer exposed: Secondary | ICD-10-CM | POA: Diagnosis not present

## 2016-08-09 DIAGNOSIS — L97511 Non-pressure chronic ulcer of other part of right 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: Secondary | ICD-10-CM | POA: Diagnosis not present

## 2016-08-09 DIAGNOSIS — I87332 Chronic venous hypertension (idiopathic) with ulcer and inflammation of left lower extremity: Secondary | ICD-10-CM | POA: Diagnosis not present

## 2016-08-09 DIAGNOSIS — L89893 Pressure ulcer of other site, stage 3: Secondary | ICD-10-CM | POA: Diagnosis not present

## 2016-08-12 DIAGNOSIS — L97511 Non-pressure chronic ulcer of other part of right 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: Secondary | ICD-10-CM | POA: Diagnosis not present

## 2016-08-12 DIAGNOSIS — L97521 Non-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: Secondary | ICD-10-CM | POA: Diagnosis not present

## 2016-08-12 DIAGNOSIS — L89893 Pressure ulcer of other site, stage 3: Secondary | ICD-10-CM | POA: Diagnosis not present

## 2016-08-12 DIAGNOSIS — I87332 Chronic venous hypertension (idiopathic) with ulcer and inflammation of left lower extremity: Secondary | ICD-10-CM | POA: Diagnosis not present

## 2016-08-16 DIAGNOSIS — L97511 Non-pressure chronic ulcer of other part of right 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: Secondary | ICD-10-CM | POA: Diagnosis not present

## 2016-08-16 DIAGNOSIS — B49 Unspecified mycosis: Secondary | ICD-10-CM | POA: Diagnosis not present

## 2016-08-16 DIAGNOSIS — L089 Local infection of the skin and subcutaneous tissue, unspecified: Secondary | ICD-10-CM | POA: Diagnosis not present

## 2016-08-16 DIAGNOSIS — L97521 Non-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: Secondary | ICD-10-CM | POA: Diagnosis not present

## 2016-08-16 DIAGNOSIS — I87312 Chronic venous hypertension (idiopathic) with ulcer of left lower extremity: Secondary | ICD-10-CM | POA: Diagnosis not present

## 2016-08-16 DIAGNOSIS — L89893 Pressure ulcer of other site, stage 3: Secondary | ICD-10-CM | POA: Diagnosis not present

## 2016-08-16 DIAGNOSIS — L97821 Non-pressure chronic ulcer of other part of left lower leg limited to breakdown of skin: Secondary | ICD-10-CM | POA: Diagnosis not present

## 2016-08-16 DIAGNOSIS — I87332 Chronic venous hypertension (idiopathic) with ulcer and inflammation of left lower extremity: Secondary | ICD-10-CM | POA: Diagnosis not present

## 2016-08-19 DIAGNOSIS — I87332 Chronic venous hypertension (idiopathic) with ulcer and inflammation of left lower extremity: Secondary | ICD-10-CM | POA: Diagnosis not present

## 2016-08-19 DIAGNOSIS — L89893 Pressure ulcer of other site, stage 3: Secondary | ICD-10-CM | POA: Diagnosis not present

## 2016-08-19 DIAGNOSIS — L97521 Non-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: Secondary | ICD-10-CM | POA: Diagnosis not present

## 2016-08-19 DIAGNOSIS — L97511 Non-pressure chronic ulcer of other part of right 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: Secondary | ICD-10-CM | POA: Diagnosis not present

## 2016-08-23 ENCOUNTER — Encounter (HOSPITAL_BASED_OUTPATIENT_CLINIC_OR_DEPARTMENT_OTHER): Payer: BLUE CROSS/BLUE SHIELD | Attending: Internal Medicine

## 2016-08-23 DIAGNOSIS — I87332 Chronic venous hypertension (idiopathic) with ulcer and inflammation of left lower extremity: Secondary | ICD-10-CM | POA: Insufficient documentation

## 2016-08-23 DIAGNOSIS — L97511 Non-pressure chronic ulcer of other part of right 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: Secondary | ICD-10-CM | POA: Insufficient documentation

## 2016-08-23 DIAGNOSIS — I10 Essential (primary) hypertension: Secondary | ICD-10-CM | POA: Diagnosis not present

## 2016-08-23 DIAGNOSIS — G4730 Sleep apnea, unspecified: Secondary | ICD-10-CM | POA: Insufficient documentation

## 2016-08-23 DIAGNOSIS — L97822 Non-pressure chronic ulcer of other part of left lower leg with fat layer exposed: Secondary | ICD-10-CM | POA: Insufficient documentation

## 2016-08-23 DIAGNOSIS — G8220 Paraplegia, unspecified: Secondary | ICD-10-CM | POA: Diagnosis not present

## 2016-08-23 DIAGNOSIS — I87312 Chronic venous hypertension (idiopathic) with ulcer of left lower extremity: Secondary | ICD-10-CM | POA: Diagnosis not present

## 2016-08-23 DIAGNOSIS — L89893 Pressure ulcer of other site, stage 3: Secondary | ICD-10-CM | POA: Diagnosis not present

## 2016-08-23 DIAGNOSIS — L03115 Cellulitis of right lower limb: Secondary | ICD-10-CM | POA: Insufficient documentation

## 2016-08-23 DIAGNOSIS — L97821 Non-pressure chronic ulcer of other part of left lower leg limited to breakdown of skin: Secondary | ICD-10-CM | POA: Diagnosis not present

## 2016-08-23 DIAGNOSIS — G82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, complete: Secondary | ICD-10-CM | POA: Insufficient documentation

## 2016-08-26 DIAGNOSIS — L97822 Non-pressure chronic ulcer of other part of left lower leg with fat layer exposed: Secondary | ICD-10-CM | POA: Diagnosis not present

## 2016-08-26 DIAGNOSIS — L97511 Non-pressure chronic ulcer of other part of right 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: Secondary | ICD-10-CM | POA: Diagnosis not present

## 2016-08-26 DIAGNOSIS — I87332 Chronic venous hypertension (idiopathic) with ulcer and inflammation of left lower extremity: Secondary | ICD-10-CM | POA: Diagnosis not present

## 2016-08-26 DIAGNOSIS — L89893 Pressure ulcer of other site, stage 3: Secondary | ICD-10-CM | POA: Diagnosis not present

## 2016-08-27 DIAGNOSIS — N3001 Acute cystitis with hematuria: Secondary | ICD-10-CM | POA: Diagnosis not present

## 2016-08-30 DIAGNOSIS — L97511 Non-pressure chronic ulcer of other part of right 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: Secondary | ICD-10-CM | POA: Diagnosis not present

## 2016-08-30 DIAGNOSIS — L97822 Non-pressure chronic ulcer of other part of left lower leg with fat layer exposed: Secondary | ICD-10-CM | POA: Diagnosis not present

## 2016-08-30 DIAGNOSIS — I87332 Chronic venous hypertension (idiopathic) with ulcer and inflammation of left lower extremity: Secondary | ICD-10-CM | POA: Diagnosis not present

## 2016-08-30 DIAGNOSIS — L89893 Pressure ulcer of other site, stage 3: Secondary | ICD-10-CM | POA: Diagnosis not present

## 2016-08-30 DIAGNOSIS — F331 Major depressive disorder, recurrent, moderate: Secondary | ICD-10-CM | POA: Diagnosis not present

## 2016-09-02 DIAGNOSIS — L89893 Pressure ulcer of other site, stage 3: Secondary | ICD-10-CM | POA: Diagnosis not present

## 2016-09-02 DIAGNOSIS — I87332 Chronic venous hypertension (idiopathic) with ulcer and inflammation of left lower extremity: Secondary | ICD-10-CM | POA: Diagnosis not present

## 2016-09-02 DIAGNOSIS — L97822 Non-pressure chronic ulcer of other part of left lower leg with fat layer exposed: Secondary | ICD-10-CM | POA: Diagnosis not present

## 2016-09-02 DIAGNOSIS — L97511 Non-pressure chronic ulcer of other part of right 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: Secondary | ICD-10-CM | POA: Diagnosis not present

## 2016-09-06 DIAGNOSIS — L97511 Non-pressure chronic ulcer of other part of right 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: Secondary | ICD-10-CM | POA: Diagnosis not present

## 2016-09-06 DIAGNOSIS — L97822 Non-pressure chronic ulcer of other part of left lower leg with fat layer exposed: Secondary | ICD-10-CM | POA: Diagnosis not present

## 2016-09-06 DIAGNOSIS — I87332 Chronic venous hypertension (idiopathic) with ulcer and inflammation of left lower extremity: Secondary | ICD-10-CM | POA: Diagnosis not present

## 2016-09-06 DIAGNOSIS — L89893 Pressure ulcer of other site, stage 3: Secondary | ICD-10-CM | POA: Diagnosis not present

## 2016-09-09 DIAGNOSIS — L89893 Pressure ulcer of other site, stage 3: Secondary | ICD-10-CM | POA: Diagnosis not present

## 2016-09-09 DIAGNOSIS — L97822 Non-pressure chronic ulcer of other part of left lower leg with fat layer exposed: Secondary | ICD-10-CM | POA: Diagnosis not present

## 2016-09-09 DIAGNOSIS — L97511 Non-pressure chronic ulcer of other part of right 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: Secondary | ICD-10-CM | POA: Diagnosis not present

## 2016-09-09 DIAGNOSIS — I87332 Chronic venous hypertension (idiopathic) with ulcer and inflammation of left lower extremity: Secondary | ICD-10-CM | POA: Diagnosis not present

## 2016-09-13 DIAGNOSIS — S91105A Unspecified open wound of left lesser toe(s) without damage to nail, initial encounter: Secondary | ICD-10-CM | POA: Diagnosis not present

## 2016-09-13 DIAGNOSIS — L97822 Non-pressure chronic ulcer of other part of left lower leg with fat layer exposed: Secondary | ICD-10-CM | POA: Diagnosis not present

## 2016-09-13 DIAGNOSIS — L89893 Pressure ulcer of other site, stage 3: Secondary | ICD-10-CM | POA: Diagnosis not present

## 2016-09-13 DIAGNOSIS — I87332 Chronic venous hypertension (idiopathic) with ulcer and inflammation of left lower extremity: Secondary | ICD-10-CM | POA: Diagnosis not present

## 2016-09-13 DIAGNOSIS — S91104A Unspecified open wound of right lesser toe(s) without damage to nail, initial encounter: Secondary | ICD-10-CM | POA: Diagnosis not present

## 2016-09-13 DIAGNOSIS — I872 Venous insufficiency (chronic) (peripheral): Secondary | ICD-10-CM | POA: Diagnosis not present

## 2016-09-13 DIAGNOSIS — L97511 Non-pressure chronic ulcer of other part of right 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: Secondary | ICD-10-CM | POA: Diagnosis not present

## 2016-09-16 DIAGNOSIS — L89893 Pressure ulcer of other site, stage 3: Secondary | ICD-10-CM | POA: Diagnosis not present

## 2016-09-16 DIAGNOSIS — I87332 Chronic venous hypertension (idiopathic) with ulcer and inflammation of left lower extremity: Secondary | ICD-10-CM | POA: Diagnosis not present

## 2016-09-16 DIAGNOSIS — L97521 Non-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: Secondary | ICD-10-CM | POA: Diagnosis not present

## 2016-09-16 DIAGNOSIS — L97822 Non-pressure chronic ulcer of other part of left lower leg with fat layer exposed: Secondary | ICD-10-CM | POA: Diagnosis not present

## 2016-09-16 DIAGNOSIS — L97821 Non-pressure chronic ulcer of other part of left lower leg limited to breakdown of skin: Secondary | ICD-10-CM | POA: Diagnosis not present

## 2016-09-16 DIAGNOSIS — L97511 Non-pressure chronic ulcer of other part of right 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: Secondary | ICD-10-CM | POA: Diagnosis not present

## 2016-09-16 DIAGNOSIS — I87313 Chronic venous hypertension (idiopathic) with ulcer of bilateral lower extremity: Secondary | ICD-10-CM | POA: Diagnosis not present

## 2016-09-20 DIAGNOSIS — L97822 Non-pressure chronic ulcer of other part of left lower leg with fat layer exposed: Secondary | ICD-10-CM | POA: Diagnosis not present

## 2016-09-20 DIAGNOSIS — L97511 Non-pressure chronic ulcer of other part of right 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: Secondary | ICD-10-CM | POA: Diagnosis not present

## 2016-09-20 DIAGNOSIS — I87332 Chronic venous hypertension (idiopathic) with ulcer and inflammation of left lower extremity: Secondary | ICD-10-CM | POA: Diagnosis not present

## 2016-09-20 DIAGNOSIS — L89893 Pressure ulcer of other site, stage 3: Secondary | ICD-10-CM | POA: Diagnosis not present

## 2016-09-23 ENCOUNTER — Encounter (HOSPITAL_BASED_OUTPATIENT_CLINIC_OR_DEPARTMENT_OTHER): Payer: BLUE CROSS/BLUE SHIELD | Attending: Internal Medicine

## 2016-09-23 DIAGNOSIS — L97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: Secondary | ICD-10-CM | POA: Diagnosis not present

## 2016-09-23 DIAGNOSIS — L97511 Non-pressure chronic ulcer of other part of right 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: Secondary | ICD-10-CM | POA: Insufficient documentation

## 2016-09-23 DIAGNOSIS — G8220 Paraplegia, unspecified: Secondary | ICD-10-CM | POA: Diagnosis not present

## 2016-09-23 DIAGNOSIS — I10 Essential (primary) hypertension: Secondary | ICD-10-CM | POA: Diagnosis not present

## 2016-09-23 DIAGNOSIS — L97221 Non-pressure chronic ulcer of left calf limited to breakdown of skin: Secondary | ICD-10-CM | POA: Diagnosis not present

## 2016-09-23 DIAGNOSIS — G4730 Sleep apnea, unspecified: Secondary | ICD-10-CM | POA: Diagnosis not present

## 2016-09-23 DIAGNOSIS — L03115 Cellulitis of right lower limb: Secondary | ICD-10-CM | POA: Diagnosis not present

## 2016-09-27 DIAGNOSIS — L97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: Secondary | ICD-10-CM | POA: Diagnosis not present

## 2016-09-27 DIAGNOSIS — L03115 Cellulitis of right lower limb: Secondary | ICD-10-CM | POA: Diagnosis not present

## 2016-09-27 DIAGNOSIS — G4730 Sleep apnea, unspecified: Secondary | ICD-10-CM | POA: Diagnosis not present

## 2016-09-27 DIAGNOSIS — I10 Essential (primary) hypertension: Secondary | ICD-10-CM | POA: Diagnosis not present

## 2016-09-27 DIAGNOSIS — L97511 Non-pressure chronic ulcer of other part of right 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: Secondary | ICD-10-CM | POA: Diagnosis not present

## 2016-09-27 DIAGNOSIS — L97221 Non-pressure chronic ulcer of left calf limited to breakdown of skin: Secondary | ICD-10-CM | POA: Diagnosis not present

## 2016-09-27 DIAGNOSIS — G8220 Paraplegia, unspecified: Secondary | ICD-10-CM | POA: Diagnosis not present

## 2016-09-30 DIAGNOSIS — G4730 Sleep apnea, unspecified: Secondary | ICD-10-CM | POA: Diagnosis not present

## 2016-09-30 DIAGNOSIS — I87312 Chronic venous hypertension (idiopathic) with ulcer of left lower extremity: Secondary | ICD-10-CM | POA: Diagnosis not present

## 2016-09-30 DIAGNOSIS — L97221 Non-pressure chronic ulcer of left calf limited to breakdown of skin: Secondary | ICD-10-CM | POA: Diagnosis not present

## 2016-09-30 DIAGNOSIS — L03115 Cellulitis of right lower limb: Secondary | ICD-10-CM | POA: Diagnosis not present

## 2016-09-30 DIAGNOSIS — G8220 Paraplegia, unspecified: Secondary | ICD-10-CM | POA: Diagnosis not present

## 2016-09-30 DIAGNOSIS — L97821 Non-pressure chronic ulcer of other part of left lower leg limited to breakdown of skin: Secondary | ICD-10-CM | POA: Diagnosis not present

## 2016-09-30 DIAGNOSIS — I10 Essential (primary) hypertension: Secondary | ICD-10-CM | POA: Diagnosis not present

## 2016-09-30 DIAGNOSIS — L97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: Secondary | ICD-10-CM | POA: Diagnosis not present

## 2016-09-30 DIAGNOSIS — L97511 Non-pressure chronic ulcer of other part of right 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: Secondary | ICD-10-CM | POA: Diagnosis not present

## 2016-10-04 DIAGNOSIS — L97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: Secondary | ICD-10-CM | POA: Diagnosis not present

## 2016-10-04 DIAGNOSIS — L97511 Non-pressure chronic ulcer of other part of right 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: Secondary | ICD-10-CM | POA: Diagnosis not present

## 2016-10-04 DIAGNOSIS — G8220 Paraplegia, unspecified: Secondary | ICD-10-CM | POA: Diagnosis not present

## 2016-10-04 DIAGNOSIS — L03115 Cellulitis of right lower limb: Secondary | ICD-10-CM | POA: Diagnosis not present

## 2016-10-04 DIAGNOSIS — G4730 Sleep apnea, unspecified: Secondary | ICD-10-CM | POA: Diagnosis not present

## 2016-10-04 DIAGNOSIS — L97221 Non-pressure chronic ulcer of left calf limited to breakdown of skin: Secondary | ICD-10-CM | POA: Diagnosis not present

## 2016-10-04 DIAGNOSIS — I10 Essential (primary) hypertension: Secondary | ICD-10-CM | POA: Diagnosis not present

## 2016-10-07 DIAGNOSIS — L03115 Cellulitis of right lower limb: Secondary | ICD-10-CM | POA: Diagnosis not present

## 2016-10-07 DIAGNOSIS — I87332 Chronic venous hypertension (idiopathic) with ulcer and inflammation of left lower extremity: Secondary | ICD-10-CM | POA: Diagnosis not present

## 2016-10-07 DIAGNOSIS — L97822 Non-pressure chronic ulcer of other part of left lower leg with fat layer exposed: Secondary | ICD-10-CM | POA: Diagnosis not present

## 2016-10-07 DIAGNOSIS — I10 Essential (primary) hypertension: Secondary | ICD-10-CM | POA: Diagnosis not present

## 2016-10-07 DIAGNOSIS — L97511 Non-pressure chronic ulcer of other part of right 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: Secondary | ICD-10-CM | POA: Diagnosis not present

## 2016-10-07 DIAGNOSIS — L97221 Non-pressure chronic ulcer of left calf limited to breakdown of skin: Secondary | ICD-10-CM | POA: Diagnosis not present

## 2016-10-07 DIAGNOSIS — G4730 Sleep apnea, unspecified: Secondary | ICD-10-CM | POA: Diagnosis not present

## 2016-10-07 DIAGNOSIS — L97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: Secondary | ICD-10-CM | POA: Diagnosis not present

## 2016-10-07 DIAGNOSIS — G8220 Paraplegia, unspecified: Secondary | ICD-10-CM | POA: Diagnosis not present

## 2016-10-11 DIAGNOSIS — L03115 Cellulitis of right lower limb: Secondary | ICD-10-CM | POA: Diagnosis not present

## 2016-10-11 DIAGNOSIS — L97511 Non-pressure chronic ulcer of other part of right 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: Secondary | ICD-10-CM | POA: Diagnosis not present

## 2016-10-11 DIAGNOSIS — I10 Essential (primary) hypertension: Secondary | ICD-10-CM | POA: Diagnosis not present

## 2016-10-11 DIAGNOSIS — L97221 Non-pressure chronic ulcer of left calf limited to breakdown of skin: Secondary | ICD-10-CM | POA: Diagnosis not present

## 2016-10-11 DIAGNOSIS — G8220 Paraplegia, unspecified: Secondary | ICD-10-CM | POA: Diagnosis not present

## 2016-10-11 DIAGNOSIS — G4730 Sleep apnea, unspecified: Secondary | ICD-10-CM | POA: Diagnosis not present

## 2016-10-11 DIAGNOSIS — L97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: Secondary | ICD-10-CM | POA: Diagnosis not present

## 2016-10-18 DIAGNOSIS — L03115 Cellulitis of right lower limb: Secondary | ICD-10-CM | POA: Diagnosis not present

## 2016-10-18 DIAGNOSIS — L97511 Non-pressure chronic ulcer of other part of right 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: Secondary | ICD-10-CM | POA: Diagnosis not present

## 2016-10-18 DIAGNOSIS — L97221 Non-pressure chronic ulcer of left calf limited to breakdown of skin: Secondary | ICD-10-CM | POA: Diagnosis not present

## 2016-10-18 DIAGNOSIS — L97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: Secondary | ICD-10-CM | POA: Diagnosis not present

## 2016-10-18 DIAGNOSIS — I10 Essential (primary) hypertension: Secondary | ICD-10-CM | POA: Diagnosis not present

## 2016-10-18 DIAGNOSIS — G4730 Sleep apnea, unspecified: Secondary | ICD-10-CM | POA: Diagnosis not present

## 2016-10-18 DIAGNOSIS — G8220 Paraplegia, unspecified: Secondary | ICD-10-CM | POA: Diagnosis not present

## 2016-10-21 DIAGNOSIS — L97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: Secondary | ICD-10-CM | POA: Diagnosis not present

## 2016-10-21 DIAGNOSIS — L03115 Cellulitis of right lower limb: Secondary | ICD-10-CM | POA: Diagnosis not present

## 2016-10-21 DIAGNOSIS — G4730 Sleep apnea, unspecified: Secondary | ICD-10-CM | POA: Diagnosis not present

## 2016-10-21 DIAGNOSIS — L97221 Non-pressure chronic ulcer of left calf limited to breakdown of skin: Secondary | ICD-10-CM | POA: Diagnosis not present

## 2016-10-21 DIAGNOSIS — G8220 Paraplegia, unspecified: Secondary | ICD-10-CM | POA: Diagnosis not present

## 2016-10-21 DIAGNOSIS — I10 Essential (primary) hypertension: Secondary | ICD-10-CM | POA: Diagnosis not present

## 2016-10-21 DIAGNOSIS — I87332 Chronic venous hypertension (idiopathic) with ulcer and inflammation of left lower extremity: Secondary | ICD-10-CM | POA: Diagnosis not present

## 2016-10-21 DIAGNOSIS — L97511 Non-pressure chronic ulcer of other part of right 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: Secondary | ICD-10-CM | POA: Diagnosis not present

## 2016-10-25 ENCOUNTER — Encounter (HOSPITAL_BASED_OUTPATIENT_CLINIC_OR_DEPARTMENT_OTHER): Payer: BLUE CROSS/BLUE SHIELD | Attending: Internal Medicine

## 2016-10-25 DIAGNOSIS — G82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, complete: Secondary | ICD-10-CM | POA: Diagnosis not present

## 2016-10-25 DIAGNOSIS — F331 Major depressive disorder, recurrent, moderate: Secondary | ICD-10-CM | POA: Diagnosis not present

## 2016-10-25 DIAGNOSIS — I87332 Chronic venous hypertension (idiopathic) with ulcer and inflammation of left lower extremity: Secondary | ICD-10-CM | POA: Diagnosis not present

## 2016-10-25 DIAGNOSIS — L97511 Non-pressure chronic ulcer of other part of right 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: Secondary | ICD-10-CM | POA: Insufficient documentation

## 2016-10-25 DIAGNOSIS — I10 Essential (primary) hypertension: Secondary | ICD-10-CM | POA: Diagnosis not present

## 2016-10-25 DIAGNOSIS — L89899 Pressure ulcer of other site, unspecified stage: Secondary | ICD-10-CM | POA: Insufficient documentation

## 2016-10-25 DIAGNOSIS — G4730 Sleep apnea, unspecified: Secondary | ICD-10-CM | POA: Insufficient documentation

## 2016-10-25 DIAGNOSIS — L03115 Cellulitis of right lower limb: Secondary | ICD-10-CM | POA: Diagnosis not present

## 2016-10-28 DIAGNOSIS — L89523 Pressure ulcer of left ankle, stage 3: Secondary | ICD-10-CM | POA: Diagnosis not present

## 2016-10-28 DIAGNOSIS — L89899 Pressure ulcer of other site, unspecified stage: Secondary | ICD-10-CM | POA: Diagnosis not present

## 2016-10-28 DIAGNOSIS — L97511 Non-pressure chronic ulcer of other part of right 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: Secondary | ICD-10-CM | POA: Diagnosis not present

## 2016-10-28 DIAGNOSIS — I87332 Chronic venous hypertension (idiopathic) with ulcer and inflammation of left lower extremity: Secondary | ICD-10-CM | POA: Diagnosis not present

## 2016-10-28 DIAGNOSIS — L03115 Cellulitis of right lower limb: Secondary | ICD-10-CM | POA: Diagnosis not present

## 2016-11-01 DIAGNOSIS — L97511 Non-pressure chronic ulcer of other part of right 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: Secondary | ICD-10-CM | POA: Diagnosis not present

## 2016-11-01 DIAGNOSIS — L89899 Pressure ulcer of other site, unspecified stage: Secondary | ICD-10-CM | POA: Diagnosis not present

## 2016-11-01 DIAGNOSIS — I87332 Chronic venous hypertension (idiopathic) with ulcer and inflammation of left lower extremity: Secondary | ICD-10-CM | POA: Diagnosis not present

## 2016-11-01 DIAGNOSIS — L03115 Cellulitis of right lower limb: Secondary | ICD-10-CM | POA: Diagnosis not present

## 2016-11-04 DIAGNOSIS — S91102A Unspecified open wound of left great toe without damage to nail, initial encounter: Secondary | ICD-10-CM | POA: Diagnosis not present

## 2016-11-04 DIAGNOSIS — L03115 Cellulitis of right lower limb: Secondary | ICD-10-CM | POA: Diagnosis not present

## 2016-11-04 DIAGNOSIS — I87332 Chronic venous hypertension (idiopathic) with ulcer and inflammation of left lower extremity: Secondary | ICD-10-CM | POA: Diagnosis not present

## 2016-11-04 DIAGNOSIS — L97511 Non-pressure chronic ulcer of other part of right 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: Secondary | ICD-10-CM | POA: Diagnosis not present

## 2016-11-04 DIAGNOSIS — S91104A Unspecified open wound of right lesser toe(s) without damage to nail, initial encounter: Secondary | ICD-10-CM | POA: Diagnosis not present

## 2016-11-04 DIAGNOSIS — I87312 Chronic venous hypertension (idiopathic) with ulcer of left lower extremity: Secondary | ICD-10-CM | POA: Diagnosis not present

## 2016-11-04 DIAGNOSIS — L97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: Secondary | ICD-10-CM | POA: Diagnosis not present

## 2016-11-04 DIAGNOSIS — L97222 Non-pressure chronic ulcer of left calf with fat layer exposed: Secondary | ICD-10-CM | POA: Diagnosis not present

## 2016-11-04 DIAGNOSIS — L89899 Pressure ulcer of other site, unspecified stage: Secondary | ICD-10-CM | POA: Diagnosis not present

## 2016-11-06 DIAGNOSIS — Z6841 Body Mass Index (BMI) 40.0 and over, adult: Secondary | ICD-10-CM | POA: Diagnosis not present

## 2016-11-06 DIAGNOSIS — A5217 General paresis: Secondary | ICD-10-CM | POA: Diagnosis not present

## 2016-11-06 DIAGNOSIS — M7989 Other specified soft tissue disorders: Secondary | ICD-10-CM | POA: Diagnosis not present

## 2016-11-06 DIAGNOSIS — E872 Acidosis: Secondary | ICD-10-CM | POA: Diagnosis not present

## 2016-11-06 DIAGNOSIS — R7989 Other specified abnormal findings of blood chemistry: Secondary | ICD-10-CM | POA: Diagnosis not present

## 2016-11-06 DIAGNOSIS — L97929 Non-pressure chronic ulcer of unspecified part of left lower leg with unspecified severity: Secondary | ICD-10-CM | POA: Diagnosis not present

## 2016-11-06 DIAGNOSIS — R600 Localized edema: Secondary | ICD-10-CM | POA: Diagnosis not present

## 2016-11-06 DIAGNOSIS — M62838 Other muscle spasm: Secondary | ICD-10-CM

## 2016-11-06 DIAGNOSIS — Z792 Long term (current) use of antibiotics: Secondary | ICD-10-CM | POA: Diagnosis not present

## 2016-11-06 DIAGNOSIS — R6520 Severe sepsis without septic shock: Secondary | ICD-10-CM | POA: Diagnosis not present

## 2016-11-06 DIAGNOSIS — Z87891 Personal history of nicotine dependence: Secondary | ICD-10-CM | POA: Diagnosis not present

## 2016-11-06 DIAGNOSIS — I10 Essential (primary) hypertension: Secondary | ICD-10-CM | POA: Diagnosis not present

## 2016-11-06 DIAGNOSIS — Z79899 Other long term (current) drug therapy: Secondary | ICD-10-CM | POA: Diagnosis not present

## 2016-11-06 DIAGNOSIS — R6521 Severe sepsis with septic shock: Secondary | ICD-10-CM | POA: Diagnosis not present

## 2016-11-06 DIAGNOSIS — M609 Myositis, unspecified: Secondary | ICD-10-CM | POA: Diagnosis not present

## 2016-11-06 DIAGNOSIS — F329 Major depressive disorder, single episode, unspecified: Secondary | ICD-10-CM | POA: Diagnosis not present

## 2016-11-06 DIAGNOSIS — M8580 Other specified disorders of bone density and structure, unspecified site: Secondary | ICD-10-CM | POA: Diagnosis not present

## 2016-11-06 DIAGNOSIS — A419 Sepsis, unspecified organism: Secondary | ICD-10-CM | POA: Diagnosis not present

## 2016-11-06 DIAGNOSIS — N319 Neuromuscular dysfunction of bladder, unspecified: Secondary | ICD-10-CM | POA: Diagnosis not present

## 2016-11-06 DIAGNOSIS — L03115 Cellulitis of right lower limb: Secondary | ICD-10-CM | POA: Diagnosis not present

## 2016-11-07 DIAGNOSIS — L97929 Non-pressure chronic ulcer of unspecified part of left lower leg with unspecified severity: Secondary | ICD-10-CM | POA: Diagnosis not present

## 2016-11-07 DIAGNOSIS — R7989 Other specified abnormal findings of blood chemistry: Secondary | ICD-10-CM | POA: Diagnosis not present

## 2016-11-07 DIAGNOSIS — A419 Sepsis, unspecified organism: Secondary | ICD-10-CM | POA: Diagnosis not present

## 2016-11-07 DIAGNOSIS — L03115 Cellulitis of right lower limb: Secondary | ICD-10-CM | POA: Diagnosis not present

## 2016-11-08 DIAGNOSIS — L03115 Cellulitis of right lower limb: Secondary | ICD-10-CM | POA: Diagnosis not present

## 2016-11-08 DIAGNOSIS — R7989 Other specified abnormal findings of blood chemistry: Secondary | ICD-10-CM | POA: Diagnosis not present

## 2016-11-08 DIAGNOSIS — M7989 Other specified soft tissue disorders: Secondary | ICD-10-CM | POA: Diagnosis not present

## 2016-11-08 DIAGNOSIS — A419 Sepsis, unspecified organism: Secondary | ICD-10-CM | POA: Diagnosis not present

## 2016-11-08 DIAGNOSIS — L97929 Non-pressure chronic ulcer of unspecified part of left lower leg with unspecified severity: Secondary | ICD-10-CM | POA: Diagnosis not present

## 2016-11-08 DIAGNOSIS — R600 Localized edema: Secondary | ICD-10-CM | POA: Diagnosis not present

## 2016-11-09 ENCOUNTER — Inpatient Hospital Stay (HOSPITAL_COMMUNITY)
Admission: AD | Admit: 2016-11-09 | Discharge: 2016-11-12 | DRG: 872 | Disposition: A | Discharge status: Home / Self Care | Payer: BLUE CROSS/BLUE SHIELD | Source: Other Acute Inpatient Hospital | Attending: Internal Medicine | Admitting: Internal Medicine

## 2016-11-09 ENCOUNTER — Encounter (HOSPITAL_COMMUNITY): Payer: Self-pay | Admitting: *Deleted

## 2016-11-09 DIAGNOSIS — L97929 Non-pressure chronic ulcer of unspecified part of left lower leg with unspecified severity: Secondary | ICD-10-CM | POA: Diagnosis not present

## 2016-11-09 DIAGNOSIS — I878 Other specified disorders of veins: Secondary | ICD-10-CM | POA: Diagnosis present

## 2016-11-09 DIAGNOSIS — N319 Neuromuscular dysfunction of bladder, unspecified: Secondary | ICD-10-CM

## 2016-11-09 DIAGNOSIS — Z792 Long term (current) use of antibiotics: Secondary | ICD-10-CM

## 2016-11-09 DIAGNOSIS — Z993 Dependence on wheelchair: Secondary | ICD-10-CM

## 2016-11-09 DIAGNOSIS — Z87891 Personal history of nicotine dependence: Secondary | ICD-10-CM

## 2016-11-09 DIAGNOSIS — Z86718 Personal history of other venous thrombosis and embolism: Secondary | ICD-10-CM

## 2016-11-09 DIAGNOSIS — G8220 Paraplegia, unspecified: Secondary | ICD-10-CM | POA: Diagnosis not present

## 2016-11-09 DIAGNOSIS — K592 Neurogenic bowel, not elsewhere classified: Secondary | ICD-10-CM | POA: Diagnosis not present

## 2016-11-09 DIAGNOSIS — R7989 Other specified abnormal findings of blood chemistry: Secondary | ICD-10-CM | POA: Diagnosis not present

## 2016-11-09 DIAGNOSIS — D473 Essential (hemorrhagic) thrombocythemia: Secondary | ICD-10-CM | POA: Diagnosis present

## 2016-11-09 DIAGNOSIS — S24103S Unspecified injury at T7-T10 level of thoracic spinal cord, sequela: Secondary | ICD-10-CM | POA: Diagnosis not present

## 2016-11-09 DIAGNOSIS — L03115 Cellulitis of right lower limb: Secondary | ICD-10-CM | POA: Diagnosis present

## 2016-11-09 DIAGNOSIS — Z880 Allergy status to penicillin: Secondary | ICD-10-CM

## 2016-11-09 DIAGNOSIS — Z881 Allergy status to other antibiotic agents status: Secondary | ICD-10-CM

## 2016-11-09 DIAGNOSIS — F329 Major depressive disorder, single episode, unspecified: Secondary | ICD-10-CM | POA: Diagnosis not present

## 2016-11-09 DIAGNOSIS — Z888 Allergy status to other drugs, medicaments and biological substances status: Secondary | ICD-10-CM | POA: Diagnosis not present

## 2016-11-09 DIAGNOSIS — B9689 Other specified bacterial agents as the cause of diseases classified elsewhere: Secondary | ICD-10-CM | POA: Diagnosis not present

## 2016-11-09 DIAGNOSIS — Z9081 Acquired absence of spleen: Secondary | ICD-10-CM | POA: Diagnosis not present

## 2016-11-09 DIAGNOSIS — M62838 Other muscle spasm: Secondary | ICD-10-CM

## 2016-11-09 DIAGNOSIS — E669 Obesity, unspecified: Secondary | ICD-10-CM | POA: Diagnosis not present

## 2016-11-09 DIAGNOSIS — Z89422 Acquired absence of other left toe(s): Secondary | ICD-10-CM | POA: Diagnosis not present

## 2016-11-09 DIAGNOSIS — Z79899 Other long term (current) drug therapy: Secondary | ICD-10-CM

## 2016-11-09 DIAGNOSIS — L97909 Non-pressure chronic ulcer of unspecified part of unspecified lower leg with unspecified severity: Secondary | ICD-10-CM | POA: Diagnosis not present

## 2016-11-09 DIAGNOSIS — Z6841 Body Mass Index (BMI) 40.0 and over, adult: Secondary | ICD-10-CM | POA: Diagnosis not present

## 2016-11-09 DIAGNOSIS — D75839 Thrombocytosis, unspecified: Secondary | ICD-10-CM | POA: Diagnosis present

## 2016-11-09 DIAGNOSIS — Z95828 Presence of other vascular implants and grafts: Secondary | ICD-10-CM | POA: Diagnosis not present

## 2016-11-09 DIAGNOSIS — Z7401 Bed confinement status: Secondary | ICD-10-CM

## 2016-11-09 DIAGNOSIS — G4733 Obstructive sleep apnea (adult) (pediatric): Secondary | ICD-10-CM | POA: Diagnosis present

## 2016-11-09 DIAGNOSIS — D509 Iron deficiency anemia, unspecified: Secondary | ICD-10-CM | POA: Diagnosis not present

## 2016-11-09 DIAGNOSIS — I10 Essential (primary) hypertension: Secondary | ICD-10-CM | POA: Diagnosis not present

## 2016-11-09 DIAGNOSIS — A419 Sepsis, unspecified organism: Secondary | ICD-10-CM | POA: Diagnosis not present

## 2016-11-09 DIAGNOSIS — R6520 Severe sepsis without septic shock: Secondary | ICD-10-CM | POA: Diagnosis present

## 2016-11-09 DIAGNOSIS — K219 Gastro-esophageal reflux disease without esophagitis: Secondary | ICD-10-CM | POA: Diagnosis present

## 2016-11-09 DIAGNOSIS — Z452 Encounter for adjustment and management of vascular access device: Secondary | ICD-10-CM | POA: Diagnosis not present

## 2016-11-09 DIAGNOSIS — S24103A Unspecified injury at T7-T10 level of thoracic spinal cord, initial encounter: Secondary | ICD-10-CM

## 2016-11-09 LAB — CREATININE, SERUM
Creatinine, Ser: 1.07 mg/dL (ref 0.61–1.24)
GFR calc Af Amer: >60 mL/min (ref >60)
GFR calc non Af Amer: >60 mL/min (ref >60)

## 2016-11-09 LAB — CBC
HCT: 34.8 % — ABNORMAL LOW (ref 39.0–52.0)
Hemoglobin: 11.3 g/dL — ABNORMAL LOW (ref 13.0–17.0)
MCH: 25.2 pg — ABNORMAL LOW (ref 26.0–34.0)
MCHC: 32.5 g/dL (ref 30.0–36.0)
MCV: 77.7 fL — ABNORMAL LOW (ref 78.0–100.0)
Platelets: 425 10*3/uL — ABNORMAL HIGH (ref 150–400)
RBC: 4.48 MIL/uL (ref 4.22–5.81)
RDW: 17 % — ABNORMAL HIGH (ref 11.5–15.5)
WBC: 20.8 10*3/uL — ABNORMAL HIGH (ref 4.0–10.5)

## 2016-11-09 MED ORDER — BACLOFEN 20 MG PO TABS
40.0000 mg | ORAL_TABLET | Freq: Every day | ORAL | Status: DC
  Administered 2016-11-10 – 2016-11-12 (×3): 40 mg via ORAL
  Filled 2016-11-09: qty 4
  Filled 2016-11-09 (×2): qty 2

## 2016-11-09 MED ORDER — ACETAMINOPHEN 325 MG PO TABS
650.0000 mg | ORAL_TABLET | Freq: Four times a day (QID) | ORAL | Status: DC | PRN
  Administered 2016-11-09 – 2016-11-11 (×5): 650 mg via ORAL
  Filled 2016-11-09 (×5): qty 2

## 2016-11-09 MED ORDER — IMIPRAMINE HCL 50 MG PO TABS
50.0000 mg | ORAL_TABLET | Freq: Every day | ORAL | Status: DC
  Administered 2016-11-09 – 2016-11-12 (×4): 50 mg via ORAL
  Filled 2016-11-09 (×4): qty 1

## 2016-11-09 MED ORDER — BACLOFEN 20 MG PO TABS
60.0000 mg | ORAL_TABLET | Freq: Every day | ORAL | Status: DC
  Administered 2016-11-09 – 2016-11-12 (×4): 60 mg via ORAL
  Filled 2016-11-09 (×4): qty 3

## 2016-11-09 MED ORDER — LISINOPRIL-HYDROCHLOROTHIAZIDE 20-25 MG PO TABS: 1.0000 | ORAL_TABLET | Freq: Every day | ORAL | Status: DC

## 2016-11-09 MED ORDER — TRAZODONE HCL 50 MG PO TABS
50.0000 mg | ORAL_TABLET | Freq: Every day | ORAL | Status: DC
  Administered 2016-11-09 – 2016-11-12 (×4): 50 mg via ORAL
  Filled 2016-11-09 (×4): qty 1

## 2016-11-09 MED ORDER — BACLOFEN 20 MG PO TABS: 40.0000 mg | ORAL_TABLET | Freq: Three times a day (TID) | ORAL | Status: DC

## 2016-11-09 MED ORDER — ENOXAPARIN SODIUM 60 MG/0.6ML ~~LOC~~ SOLN
60.0000 mg | SUBCUTANEOUS | Status: DC
  Administered 2016-11-09 – 2016-11-12 (×4): 60 mg via SUBCUTANEOUS
  Filled 2016-11-09 (×4): qty 0.6

## 2016-11-09 MED ORDER — ONDANSETRON HCL 4 MG PO TABS: 4.0000 mg | ORAL_TABLET | Freq: Four times a day (QID) | ORAL | Status: DC | PRN

## 2016-11-09 MED ORDER — HYDROCHLOROTHIAZIDE 25 MG PO TABS
25.0000 mg | ORAL_TABLET | Freq: Every day | ORAL | Status: DC
  Administered 2016-11-10 – 2016-11-12 (×3): 25 mg via ORAL
  Filled 2016-11-09 (×3): qty 1

## 2016-11-09 MED ORDER — IBUPROFEN 200 MG PO TABS: 400.0000 mg | ORAL_TABLET | Freq: Four times a day (QID) | ORAL | Status: DC | PRN

## 2016-11-09 MED ORDER — BACLOFEN 20 MG PO TABS
60.0000 mg | ORAL_TABLET | Freq: Every day | ORAL | Status: DC
  Administered 2016-11-10 – 2016-11-12 (×3): 60 mg via ORAL
  Filled 2016-11-09 (×3): qty 3

## 2016-11-09 MED ORDER — LISINOPRIL 20 MG PO TABS
20.0000 mg | ORAL_TABLET | Freq: Every day | ORAL | Status: DC
  Administered 2016-11-10 – 2016-11-12 (×3): 20 mg via ORAL
  Filled 2016-11-09 (×3): qty 1

## 2016-11-09 MED ORDER — CHLORHEXIDINE GLUCONATE 0.12 % MT SOLN
15.0000 mL | Freq: Two times a day (BID) | OROMUCOSAL | Status: DC
  Administered 2016-11-10 – 2016-11-11 (×2): 15 mL via OROMUCOSAL
  Filled 2016-11-09 (×6): qty 15

## 2016-11-09 MED ORDER — ACETAMINOPHEN 650 MG RE SUPP: 650.0000 mg | Freq: Four times a day (QID) | RECTAL | Status: DC | PRN

## 2016-11-09 MED ORDER — SERTRALINE HCL 100 MG PO TABS
100.0000 mg | ORAL_TABLET | Freq: Every day | ORAL | Status: DC
  Administered 2016-11-10 – 2016-11-11 (×2): 100 mg via ORAL
  Filled 2016-11-09: qty 2
  Filled 2016-11-09 (×2): qty 1
  Filled 2016-11-09: qty 2
  Filled 2016-11-09: qty 1

## 2016-11-09 MED ORDER — ONDANSETRON HCL 4 MG/2ML IJ SOLN: 4.0000 mg | Freq: Four times a day (QID) | INTRAMUSCULAR | Status: DC | PRN

## 2016-11-09 MED ORDER — OXYBUTYNIN CHLORIDE ER 5 MG PO TB24
30.0000 mg | ORAL_TABLET | Freq: Every day | ORAL | Status: DC
  Administered 2016-11-09 – 2016-11-10 (×2): 30 mg via ORAL
  Filled 2016-11-09 (×2): qty 6

## 2016-11-09 MED ORDER — KETOROLAC TROMETHAMINE 30 MG/ML IJ SOLN: 30.0000 mg | Freq: Four times a day (QID) | INTRAMUSCULAR | Status: DC | PRN

## 2016-11-09 MED ORDER — ORAL CARE MOUTH RINSE
15.0000 mL | Freq: Two times a day (BID) | OROMUCOSAL | Status: DC
  Administered 2016-11-09: 15 mL via OROMUCOSAL
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
## 2016-11-10 DIAGNOSIS — Z87891 Personal history of nicotine dependence: Secondary | ICD-10-CM

## 2016-11-10 DIAGNOSIS — Z882 Allergy status to sulfonamides status: Secondary | ICD-10-CM

## 2016-11-10 DIAGNOSIS — Z833 Family history of diabetes mellitus: Secondary | ICD-10-CM

## 2016-11-10 DIAGNOSIS — Z880 Allergy status to penicillin: Secondary | ICD-10-CM

## 2016-11-10 DIAGNOSIS — G8220 Paraplegia, unspecified: Secondary | ICD-10-CM

## 2016-11-10 DIAGNOSIS — B9689 Other specified bacterial agents as the cause of diseases classified elsewhere: Secondary | ICD-10-CM

## 2016-11-10 DIAGNOSIS — L03115 Cellulitis of right lower limb: Secondary | ICD-10-CM

## 2016-11-10 DIAGNOSIS — Z8739 Personal history of other diseases of the musculoskeletal system and connective tissue: Secondary | ICD-10-CM

## 2016-11-10 DIAGNOSIS — E669 Obesity, unspecified: Secondary | ICD-10-CM

## 2016-11-10 LAB — COMPREHENSIVE METABOLIC PANEL
ALT: 81 U/L — ABNORMAL HIGH (ref 17–63)
AST: 62 U/L — ABNORMAL HIGH (ref 15–41)
Albumin: 2.8 g/dL — ABNORMAL LOW (ref 3.5–5.0)
Alkaline Phosphatase: 135 U/L — ABNORMAL HIGH (ref 38–126)
Anion gap: 11 (ref 5–15)
BUN: 8 mg/dL (ref 6–20)
CO2: 24 mmol/L (ref 22–32)
Calcium: 8.1 mg/dL — ABNORMAL LOW (ref 8.9–10.3)
Chloride: 100 mmol/L — ABNORMAL LOW (ref 101–111)
Creatinine, Ser: 1 mg/dL (ref 0.61–1.24)
GFR calc Af Amer: >60 mL/min (ref >60)
GFR calc non Af Amer: >60 mL/min (ref >60)
Glucose, Bld: 117 mg/dL — ABNORMAL HIGH (ref 65–99)
Potassium: 3.6 mmol/L (ref 3.5–5.1)
Sodium: 135 mmol/L (ref 135–145)
Total Bilirubin: 0.9 mg/dL (ref 0.3–1.2)
Total Protein: 7.3 g/dL (ref 6.5–8.1)

## 2016-11-10 LAB — CBC
HCT: 32.7 % — ABNORMAL LOW (ref 39.0–52.0)
Hemoglobin: 11.1 g/dL — ABNORMAL LOW (ref 13.0–17.0)
MCH: 26 pg (ref 26.0–34.0)
MCHC: 33.9 g/dL (ref 30.0–36.0)
MCV: 76.6 fL — ABNORMAL LOW (ref 78.0–100.0)
Platelets: 434 10*3/uL — ABNORMAL HIGH (ref 150–400)
RBC: 4.27 MIL/uL (ref 4.22–5.81)
RDW: 16.8 % — ABNORMAL HIGH (ref 11.5–15.5)
WBC: 26.6 10*3/uL — ABNORMAL HIGH (ref 4.0–10.5)

## 2016-11-10 LAB — LACTIC ACID, PLASMA
Lactic Acid, Venous: 2.2 mmol/L (ref 0.5–1.9)
Lactic Acid, Venous: 1.1 mmol/L (ref 0.5–1.9)

## 2016-11-10 MED ORDER — DIPHENHYDRAMINE HCL 50 MG PO CAPS
50.0000 mg | ORAL_CAPSULE | ORAL | Status: AC
  Administered 2016-11-10: 50 mg via ORAL
  Filled 2016-11-10: qty 1

## 2016-11-10 MED ORDER — DIPHENHYDRAMINE HCL 50 MG PO CAPS
50.0000 mg | ORAL_CAPSULE | Freq: Four times a day (QID) | ORAL | Status: DC
  Administered 2016-11-10 – 2016-11-11 (×2): 50 mg via ORAL
  Filled 2016-11-10 (×4): qty 1

## 2016-11-10 MED ORDER — SODIUM CHLORIDE 0.9 % IV SOLN
INTRAVENOUS | Status: DC
  Administered 2016-11-10 – 2016-11-11 (×5): via INTRAVENOUS

## 2016-11-10 MED ORDER — VANCOMYCIN HCL 10 G IV SOLR
2500.0000 mg | INTRAVENOUS | Status: AC
  Administered 2016-11-10: 2500 mg via INTRAVENOUS
  Filled 2016-11-10: qty 2500

## 2016-11-10 MED ORDER — IMIPENEM-CILASTATIN 500 MG IV SOLR
500.0000 mg | Freq: Four times a day (QID) | INTRAVENOUS | Status: DC
  Administered 2016-11-10: 500 mg via INTRAVENOUS
  Filled 2016-11-10 (×2): qty 500

## 2016-11-10 MED ORDER — VANCOMYCIN HCL 10 G IV SOLR
1500.0000 mg | Freq: Two times a day (BID) | INTRAVENOUS | Status: DC
  Filled 2016-11-10: qty 1500

## 2016-11-10 MED ORDER — DIPHENHYDRAMINE HCL 50 MG PO CAPS: 50.0000 mg | ORAL_CAPSULE | Freq: Four times a day (QID) | ORAL | Status: DC

## 2016-11-10 MED ORDER — SODIUM CHLORIDE 0.9 % IV SOLN
600.0000 mg | Freq: Two times a day (BID) | INTRAVENOUS | Status: DC
  Administered 2016-11-10 – 2016-11-12 (×5): 600 mg via INTRAVENOUS
  Filled 2016-11-10 (×6): qty 600

## 2016-11-10 MED ORDER — IBUPROFEN 200 MG PO TABS
400.0000 mg | ORAL_TABLET | Freq: Four times a day (QID) | ORAL | Status: DC | PRN
  Administered 2016-11-10: 400 mg via ORAL
  Filled 2016-11-10: qty 2
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
## 2016-11-10 NOTE — Progress Notes (Signed)
Pharmacy Antibiotic Note  [name] is a 30 y.o. male transferred from [facility] on 11/09/2016 with severe sepsis secondary to cellulitis. Pharmacy has been consulted today for Vancomycin and Primaxin dosing. Note patient was on same antibiotics at outside hospital, with last dose of Vancomycin 1500mg  on 12/19 at 0502 and Primaxin 500mg  on 12/19 at 0858 per review of records. Patient with allergy to Meropenem, but reports he was tolerating Primaxin well at outside hospital with benadryl pre-treatment.   Plan: Vancomycin 2500mg  IV x 1 STAT, then resume 1500mg  IV q12h.  Given obesity, do expect some accumulation of Vancomycin, so will plan to re-check Vancomycin trough level in ~ 3 days, if continued.  Primaxin 500mg  IV q6h with Benadryl 50mg  PO 30 minutes prior to each dose  Monitor renal function, cultures, clinical course.   Height: 5\' 10"  (177.8 cm) Weight: 283 lb 1.1 oz (128.4 kg) IBW/kg (Calculated) : 73  Temp (24hrs), Avg:101.3 F (38.5 C), Min:99.2 F (37.3 C), Max:102.8 F (39.3 C)   Recent Labs Lab 11/09/16 1444 11/10/16 0614 11/10/16 0834  WBC 20.8* 26.6*  --   CREATININE 1.07 1.00  --   LATICACIDVEN  --   --  2.2*    Estimated Creatinine Clearance: 148.1 mL/min (by C-G formula based on SCr of 1 mg/dL).    Allergies  Allergen Reactions  . Sulfa Antibiotics Hives  . Levofloxacin Rash  . Meropenem Rash  . Penicillins Rash    Has patient had a PCN reaction causing immediate rash, facial/tongue/throat swelling, SOB or lightheadedness with hypotension: No Has patient had a PCN reaction causing severe rash involving mucus membranes or skin necrosis:Yes  Has patient had a PCN reaction that required hospitalization No Has patient had a PCN reaction occurring within the last 10 years: No If all of the above answers are "NO", then may proceed with Cephalosporin use.   . Zyvox [Linezolid] Rash    Antimicrobials this admission: PTA at [facility] 12/16  Vancomycin >> 12/19 12/16 Pirmaxin >> 12/19  [facility] Admission 12/20 Vancomycin >> 12/20 Primaxin >>  Dose adjustments this admission: (PTA at [facility]) 12/17 Vancomycin trough level = 11 mcg/mL on 1500mg  IV q12h  Microbiology results: PTA Cx from [facility] 12/16 BCx: NGTD 12/16 UCx: NGF   12/16 MRSA PCR: positive  No cx on this admission  Thank you for allowing pharmacy to be a part of this patient's care.  Luiz Ochoa 11/10/2016 10:02 AM

## 2016-11-10 NOTE — Care Management Note (Addendum)
Case Management Note  Patient Details  Name: [name] MRN: [id number] Date of Birth: 11-08-1986  Subjective/Objective:  30 y.o. Paraplegic Male transferred 11/10/2016 from [facility] with Cellulitis. Pt has used [facility] agency in the past but will need to check with wife to see which agency. No DME needs. Performs Urinary Cath (I and O) when home and orders from online supplier. Made self available if CM needs should arise. Pam with [facility] is following for Home ABX with [place], MD.                  Action/Plan:CM will follow closely for disposition/discharge needs.    Expected Discharge Date:   (unknown)               Expected Discharge Plan:  Home/Self Care  In-House Referral:  NA  Discharge planning Services  CM Consult  Post Acute Care Choice:  NA Choice offered to:  Patient  DME Arranged:  N/A DME Agency:     HH Arranged:    HH Agency:     Status of Service:  In process, will continue to follow  If discussed at Long Length of Stay Meetings, dates discussed:    Additional Comments:  Delrae Sawyers, RN 11/10/2016, 1:40 PM

## 2016-11-11 DIAGNOSIS — Z95828 Presence of other vascular implants and grafts: Secondary | ICD-10-CM

## 2016-11-11 LAB — CBC
HCT: 33 % — ABNORMAL LOW (ref 39.0–52.0)
Hemoglobin: 11.1 g/dL — ABNORMAL LOW (ref 13.0–17.0)
MCH: 26.1 pg (ref 26.0–34.0)
MCHC: 33.6 g/dL (ref 30.0–36.0)
MCV: 77.6 fL — ABNORMAL LOW (ref 78.0–100.0)
Platelets: 495 10*3/uL — ABNORMAL HIGH (ref 150–400)
RBC: 4.25 MIL/uL (ref 4.22–5.81)
RDW: 17 % — ABNORMAL HIGH (ref 11.5–15.5)
WBC: 29 10*3/uL — ABNORMAL HIGH (ref 4.0–10.5)

## 2016-11-11 LAB — BASIC METABOLIC PANEL
Anion gap: 8 (ref 5–15)
BUN: 11 mg/dL (ref 6–20)
CO2: 28 mmol/L (ref 22–32)
Calcium: 8.1 mg/dL — ABNORMAL LOW (ref 8.9–10.3)
Chloride: 102 mmol/L (ref 101–111)
Creatinine, Ser: 0.96 mg/dL (ref 0.61–1.24)
GFR calc Af Amer: >60 mL/min (ref >60)
GFR calc non Af Amer: >60 mL/min (ref >60)
Glucose, Bld: 125 mg/dL — ABNORMAL HIGH (ref 65–99)
Potassium: 4.1 mmol/L (ref 3.5–5.1)
Sodium: 138 mmol/L (ref 135–145)

## 2016-11-11 MED ORDER — SODIUM CHLORIDE 0.9 % IV SOLN: 600.0000 mg | Freq: Two times a day (BID) | INTRAVENOUS | Status: DC

## 2016-11-11 NOTE — Progress Notes (Addendum)
    [facility]   Reason for visit: Follow up on cellulitis  Interval History: improved, fever curve down, WBC up  Physical Exam: Constitutional:  Vitals:   11/11/16 1354 11/11/16 1358  BP: (!) 114/54 (!) 121/52  Pulse: (!) 116   Resp: 20   Temp: (!) 100.7 F (38.2 C)    patient appears in NAD Respiratory: Normal respiratory effort; CTA B Cardiovascular: RRR MS: right leg with less erythema and less intensity of erythema, decreased swelling  Review of Systems: Constitutional: negative for anorexia Gastrointestinal: negative for diarrhea Integument/breast: negative for rash  Lab Results  Component Value Date   WBC 29.0 (H) 11/11/2016   HGB 11.1 (L) 11/11/2016   HCT 33.0 (L) 11/11/2016   MCV 77.6 (L) 11/11/2016   PLT 495 (H) 11/11/2016    Lab Results  Component Value Date   CREATININE 0.96 11/11/2016   BUN 11 11/11/2016   NA 138 11/11/2016   K 4.1 11/11/2016   CL 102 11/11/2016   CO2 28 11/11/2016    Lab Results  Component Value Date   ALT 81 (H) 11/10/2016   AST 62 (H) 11/10/2016   ALKPHOS 135 (H) 11/10/2016     Microbiology: No results found for this or any previous visit (from the past 240 hour(s)).  Impression/Plan:  1. Cellulitis - improved on Teflaro.  Picc line in process.  Continue Teflaro through December 30th and stop.  Pull picc line at the end of treatment.  Labs per home health protocol.  He will call us for follow up if he has any concerns, otherwise he does not need to follow up in our clinic.   2. Medication monitoring - no rash, no diarrhea.  Tolerating well.   I will sign off, please call with any issues.
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
## 2016-11-12 ENCOUNTER — Inpatient Hospital Stay (HOSPITAL_COMMUNITY): Payer: BLUE CROSS/BLUE SHIELD

## 2016-11-12 ENCOUNTER — Encounter (HOSPITAL_COMMUNITY): Payer: Self-pay | Admitting: Interventional Radiology

## 2016-11-12 DIAGNOSIS — N319 Neuromuscular dysfunction of bladder, unspecified: Secondary | ICD-10-CM

## 2016-11-12 DIAGNOSIS — A419 Sepsis, unspecified organism: Principal | ICD-10-CM

## 2016-11-12 DIAGNOSIS — L03115 Cellulitis of right lower limb: Secondary | ICD-10-CM | POA: Diagnosis not present

## 2016-11-12 DIAGNOSIS — R6520 Severe sepsis without septic shock: Secondary | ICD-10-CM

## 2016-11-12 DIAGNOSIS — Z9081 Acquired absence of spleen: Secondary | ICD-10-CM

## 2016-11-12 HISTORY — PX: IR GENERIC HISTORICAL: IMG1180011

## 2016-11-12 LAB — COMPREHENSIVE METABOLIC PANEL
ALT: 64 U/L — ABNORMAL HIGH (ref 17–63)
AST: 62 U/L — ABNORMAL HIGH (ref 15–41)
Albumin: 2.7 g/dL — ABNORMAL LOW (ref 3.5–5.0)
Alkaline Phosphatase: 148 U/L — ABNORMAL HIGH (ref 38–126)
Anion gap: 10 (ref 5–15)
BUN: 9 mg/dL (ref 6–20)
CO2: 23 mmol/L (ref 22–32)
Calcium: 7.8 mg/dL — ABNORMAL LOW (ref 8.9–10.3)
Chloride: 100 mmol/L — ABNORMAL LOW (ref 101–111)
Creatinine, Ser: 0.97 mg/dL (ref 0.61–1.24)
GFR calc Af Amer: >60 mL/min (ref >60)
GFR calc non Af Amer: >60 mL/min (ref >60)
Glucose, Bld: 157 mg/dL — ABNORMAL HIGH (ref 65–99)
Potassium: 4.1 mmol/L (ref 3.5–5.1)
Sodium: 133 mmol/L — ABNORMAL LOW (ref 135–145)
Total Bilirubin: 1.2 mg/dL (ref 0.3–1.2)
Total Protein: 7.3 g/dL (ref 6.5–8.1)

## 2016-11-12 LAB — CBC
HCT: 30.2 % — ABNORMAL LOW (ref 39.0–52.0)
Hemoglobin: 10.4 g/dL — ABNORMAL LOW (ref 13.0–17.0)
MCH: 26.3 pg (ref 26.0–34.0)
MCHC: 34.4 g/dL (ref 30.0–36.0)
MCV: 76.3 fL — ABNORMAL LOW (ref 78.0–100.0)
Platelets: 537 10*3/uL — ABNORMAL HIGH (ref 150–400)
RBC: 3.96 MIL/uL — ABNORMAL LOW (ref 4.22–5.81)
RDW: 16.7 % — ABNORMAL HIGH (ref 11.5–15.5)
WBC: 25.9 10*3/uL — ABNORMAL HIGH (ref 4.0–10.5)

## 2016-11-12 MED ORDER — SERTRALINE HCL 50 MG PO TABS
100.0000 mg | ORAL_TABLET | Freq: Every day | ORAL | Status: DC
  Administered 2016-11-12: 100 mg via ORAL
  Filled 2016-11-12: qty 2

## 2016-11-12 MED ORDER — LIDOCAINE HCL 1 % IJ SOLN
INTRAMUSCULAR | Status: AC | PRN
  Administered 2016-11-12: 5 mL

## 2016-11-12 MED ORDER — SERTRALINE HCL 100 MG PO TABS: 100.0000 mg | ORAL_TABLET | Freq: Every day | ORAL | 0 refills | Status: DC

## 2016-11-12 MED ORDER — SODIUM CHLORIDE 0.9 % IV SOLN: 600.0000 mg | Freq: Two times a day (BID) | INTRAVENOUS | 0 refills | Status: AC

## 2016-11-12 MED ORDER — HEPARIN SOD (PORK) LOCK FLUSH 100 UNIT/ML IV SOLN
INTRAVENOUS | Status: AC
  Filled 2016-11-12: qty 5

## 2016-11-12 MED ORDER — LIDOCAINE HCL 1 % IJ SOLN
INTRAMUSCULAR | Status: AC
  Filled 2016-11-12: qty 20

## 2016-11-12 MED ORDER — SODIUM CHLORIDE 0.9% FLUSH
10.0000 mL | INTRAVENOUS | Status: DC | PRN
  Administered 2016-11-12: 10 mL
  Filled 2016-11-12: qty 40

## 2016-11-12 NOTE — Progress Notes (Signed)
Pt finished IV ABT. Discharge instructions given and copy signed by patient. Family here for transport.

## 2016-11-12 NOTE — Progress Notes (Signed)
Dressings changed per wound care consult instruction.

## 2016-11-12 NOTE — Consult Note (Signed)
[facility] Nurse wound consult note Reason for Consult:Chronic venous stasis with cellulitis.  Seen at wound care center.  Wound type:Infectious Pressure Ulcer POA: N/A Measurement:Left lateral leg 2 cm x 8 cm x 0.3 cm  Scabbed lesions between toes Right lateral leg, below knee, ruptured blister 3 cm x 6 cm x 0.1 cm  Wears profore light at home.  Wife is a Marine scientist and applies his compression,   Wound bed: Pink, nongranulating, moist Drainage (amount, consistency, odor) Moderate purulent on left lateral leg, serosanguinous on right leg.  Periwound:Erythema from toes to knee, this is improved.  Dressing procedure/placement/frequency:Cleanse wounds to bilateral lower legs with NS and pat gently dry.  Apply Aquacel Ag to nonintact lesions.  Secure with kerlix and tape.  Wife will apply compression once home today per patient preference. Change twice weekly.  Follow up with wound care center. Will not follow at this time.  Please re-consult if needed.  Domenic Moras RN BSN [place] Pager [phone]

## 2016-11-12 NOTE — Procedures (Signed)
Cellulitis lower leg  S/p RUE SL POWER PICC  NO COMP TIP SVCRA READY FOR USE FULL REPORT IN PACS
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
## 2016-11-13 DIAGNOSIS — L03115 Cellulitis of right lower limb: Secondary | ICD-10-CM | POA: Diagnosis not present

## 2016-11-16 ENCOUNTER — Encounter: Payer: Self-pay | Admitting: Internal Medicine

## 2016-11-16 DIAGNOSIS — L03115 Cellulitis of right lower limb: Secondary | ICD-10-CM | POA: Diagnosis not present

## 2016-11-16 DIAGNOSIS — L0390 Cellulitis, unspecified: Secondary | ICD-10-CM | POA: Diagnosis not present

## 2016-11-18 DIAGNOSIS — L97511 Non-pressure chronic ulcer of other part of right 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: Secondary | ICD-10-CM | POA: Diagnosis not present

## 2016-11-18 DIAGNOSIS — I87332 Chronic venous hypertension (idiopathic) with ulcer and inflammation of left lower extremity: Secondary | ICD-10-CM | POA: Diagnosis not present

## 2016-11-18 DIAGNOSIS — G82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, complete: Secondary | ICD-10-CM | POA: Diagnosis not present

## 2016-11-18 DIAGNOSIS — L89899 Pressure ulcer of other site, unspecified stage: Secondary | ICD-10-CM | POA: Diagnosis not present

## 2016-11-18 DIAGNOSIS — G4730 Sleep apnea, unspecified: Secondary | ICD-10-CM | POA: Diagnosis not present

## 2016-11-18 DIAGNOSIS — L97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: Secondary | ICD-10-CM | POA: Diagnosis not present

## 2016-11-18 DIAGNOSIS — L03115 Cellulitis of right lower limb: Secondary | ICD-10-CM | POA: Diagnosis not present

## 2016-11-18 DIAGNOSIS — I10 Essential (primary) hypertension: Secondary | ICD-10-CM | POA: Diagnosis not present

## 2016-11-24 ENCOUNTER — Telehealth: Payer: Self-pay

## 2016-11-24 DIAGNOSIS — L03115 Cellulitis of right lower limb: Secondary | ICD-10-CM | POA: Diagnosis not present

## 2016-11-24 NOTE — Telephone Encounter (Signed)
Calling with questions regarding PICC.   His IV antibiotics were finished on Saturday. Does he need office visit or labs?   Per documented notes there is no need for follow up visit and once IV medications are completed PICC can be pulled. He can contact [place] if needed.   Laverle Patter, RN

## 2016-11-25 ENCOUNTER — Encounter (HOSPITAL_BASED_OUTPATIENT_CLINIC_OR_DEPARTMENT_OTHER): Payer: BLUE CROSS/BLUE SHIELD | Attending: Internal Medicine

## 2016-11-25 DIAGNOSIS — L89899 Pressure ulcer of other site, unspecified stage: Secondary | ICD-10-CM | POA: Diagnosis not present

## 2016-11-25 DIAGNOSIS — L97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: Secondary | ICD-10-CM | POA: Diagnosis not present

## 2016-11-25 DIAGNOSIS — L97521 Non-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: Secondary | ICD-10-CM | POA: Diagnosis not present

## 2016-11-25 DIAGNOSIS — I87333 Chronic venous hypertension (idiopathic) with ulcer and inflammation of bilateral lower extremity: Secondary | ICD-10-CM | POA: Diagnosis not present

## 2016-11-25 DIAGNOSIS — L97511 Non-pressure chronic ulcer of other part of right 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: Secondary | ICD-10-CM | POA: Diagnosis not present

## 2016-11-25 DIAGNOSIS — G82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, complete: Secondary | ICD-10-CM | POA: Diagnosis not present

## 2016-11-25 DIAGNOSIS — L03115 Cellulitis of right lower limb: Secondary | ICD-10-CM | POA: Diagnosis not present

## 2016-11-29 DIAGNOSIS — L97511 Non-pressure chronic ulcer of other part of right 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: Secondary | ICD-10-CM | POA: Diagnosis not present

## 2016-11-29 DIAGNOSIS — L89899 Pressure ulcer of other site, unspecified stage: Secondary | ICD-10-CM | POA: Diagnosis not present

## 2016-11-29 DIAGNOSIS — L97521 Non-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: Secondary | ICD-10-CM | POA: Diagnosis not present

## 2016-11-29 DIAGNOSIS — I87333 Chronic venous hypertension (idiopathic) with ulcer and inflammation of bilateral lower extremity: Secondary | ICD-10-CM | POA: Diagnosis not present

## 2016-12-02 DIAGNOSIS — L03116 Cellulitis of left lower limb: Secondary | ICD-10-CM | POA: Diagnosis not present

## 2016-12-02 DIAGNOSIS — L97222 Non-pressure chronic ulcer of left calf with fat layer exposed: Secondary | ICD-10-CM | POA: Diagnosis not present

## 2016-12-02 DIAGNOSIS — I87333 Chronic venous hypertension (idiopathic) with ulcer and inflammation of bilateral lower extremity: Secondary | ICD-10-CM | POA: Diagnosis not present

## 2016-12-02 DIAGNOSIS — I87332 Chronic venous hypertension (idiopathic) with ulcer and inflammation of left lower extremity: Secondary | ICD-10-CM | POA: Diagnosis not present

## 2016-12-02 DIAGNOSIS — L97521 Non-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: Secondary | ICD-10-CM | POA: Diagnosis not present

## 2016-12-02 DIAGNOSIS — L97522 Non-pressure chronic ulcer of other part of left foot with fat layer exposed: Secondary | ICD-10-CM | POA: Diagnosis not present

## 2016-12-02 DIAGNOSIS — L89899 Pressure ulcer of other site, unspecified stage: Secondary | ICD-10-CM | POA: Diagnosis not present

## 2016-12-02 DIAGNOSIS — L97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: Secondary | ICD-10-CM | POA: Diagnosis not present

## 2016-12-02 DIAGNOSIS — I87313 Chronic venous hypertension (idiopathic) with ulcer of bilateral lower extremity: Secondary | ICD-10-CM | POA: Diagnosis not present

## 2016-12-02 DIAGNOSIS — L89523 Pressure ulcer of left ankle, stage 3: Secondary | ICD-10-CM | POA: Diagnosis not present

## 2016-12-02 DIAGNOSIS — L97511 Non-pressure chronic ulcer of other part of right 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: Secondary | ICD-10-CM | POA: Diagnosis not present

## 2016-12-06 DIAGNOSIS — L97511 Non-pressure chronic ulcer of other part of right 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: Secondary | ICD-10-CM | POA: Diagnosis not present

## 2016-12-06 DIAGNOSIS — L97521 Non-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: Secondary | ICD-10-CM | POA: Diagnosis not present

## 2016-12-06 DIAGNOSIS — L89899 Pressure ulcer of other site, unspecified stage: Secondary | ICD-10-CM | POA: Diagnosis not present

## 2016-12-06 DIAGNOSIS — I87333 Chronic venous hypertension (idiopathic) with ulcer and inflammation of bilateral lower extremity: Secondary | ICD-10-CM | POA: Diagnosis not present

## 2016-12-13 DIAGNOSIS — L89899 Pressure ulcer of other site, unspecified stage: Secondary | ICD-10-CM | POA: Diagnosis not present

## 2016-12-13 DIAGNOSIS — L97521 Non-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: Secondary | ICD-10-CM | POA: Diagnosis not present

## 2016-12-13 DIAGNOSIS — I87333 Chronic venous hypertension (idiopathic) with ulcer and inflammation of bilateral lower extremity: Secondary | ICD-10-CM | POA: Diagnosis not present

## 2016-12-13 DIAGNOSIS — L97511 Non-pressure chronic ulcer of other part of right 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: Secondary | ICD-10-CM | POA: Diagnosis not present

## 2016-12-16 DIAGNOSIS — L97521 Non-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: Secondary | ICD-10-CM | POA: Diagnosis not present

## 2016-12-16 DIAGNOSIS — L89523 Pressure ulcer of left ankle, stage 3: Secondary | ICD-10-CM | POA: Diagnosis not present

## 2016-12-16 DIAGNOSIS — L97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: Secondary | ICD-10-CM | POA: Diagnosis not present

## 2016-12-16 DIAGNOSIS — L89899 Pressure ulcer of other site, unspecified stage: Secondary | ICD-10-CM | POA: Diagnosis not present

## 2016-12-16 DIAGNOSIS — L97511 Non-pressure chronic ulcer of other part of right 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: Secondary | ICD-10-CM | POA: Diagnosis not present

## 2016-12-16 DIAGNOSIS — I87332 Chronic venous hypertension (idiopathic) with ulcer and inflammation of left lower extremity: Secondary | ICD-10-CM | POA: Diagnosis not present

## 2016-12-16 DIAGNOSIS — I87333 Chronic venous hypertension (idiopathic) with ulcer and inflammation of bilateral lower extremity: Secondary | ICD-10-CM | POA: Diagnosis not present

## 2016-12-20 DIAGNOSIS — L89899 Pressure ulcer of other site, unspecified stage: Secondary | ICD-10-CM | POA: Diagnosis not present

## 2016-12-20 DIAGNOSIS — L97521 Non-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: Secondary | ICD-10-CM | POA: Diagnosis not present

## 2016-12-20 DIAGNOSIS — F331 Major depressive disorder, recurrent, moderate: Secondary | ICD-10-CM | POA: Diagnosis not present

## 2016-12-20 DIAGNOSIS — I87333 Chronic venous hypertension (idiopathic) with ulcer and inflammation of bilateral lower extremity: Secondary | ICD-10-CM | POA: Diagnosis not present

## 2016-12-20 DIAGNOSIS — L97511 Non-pressure chronic ulcer of other part of right 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: Secondary | ICD-10-CM | POA: Diagnosis not present

## 2016-12-23 ENCOUNTER — Encounter (HOSPITAL_BASED_OUTPATIENT_CLINIC_OR_DEPARTMENT_OTHER): Payer: BLUE CROSS/BLUE SHIELD | Attending: Internal Medicine

## 2016-12-23 DIAGNOSIS — S91302A Unspecified open wound, left foot, initial encounter: Secondary | ICD-10-CM | POA: Diagnosis not present

## 2016-12-23 DIAGNOSIS — L03116 Cellulitis of left lower limb: Secondary | ICD-10-CM | POA: Diagnosis not present

## 2016-12-23 DIAGNOSIS — I87332 Chronic venous hypertension (idiopathic) with ulcer and inflammation of left lower extremity: Secondary | ICD-10-CM | POA: Insufficient documentation

## 2016-12-23 DIAGNOSIS — G82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, complete: Secondary | ICD-10-CM | POA: Diagnosis not present

## 2016-12-23 DIAGNOSIS — I10 Essential (primary) hypertension: Secondary | ICD-10-CM | POA: Insufficient documentation

## 2016-12-23 DIAGNOSIS — G4730 Sleep apnea, unspecified: Secondary | ICD-10-CM | POA: Insufficient documentation

## 2016-12-23 DIAGNOSIS — I87312 Chronic venous hypertension (idiopathic) with ulcer of left lower extremity: Secondary | ICD-10-CM | POA: Diagnosis not present

## 2016-12-23 DIAGNOSIS — X58XXXA Exposure to other specified factors, initial encounter: Secondary | ICD-10-CM | POA: Insufficient documentation

## 2016-12-23 DIAGNOSIS — S91104A Unspecified open wound of right lesser toe(s) without damage to nail, initial encounter: Secondary | ICD-10-CM | POA: Diagnosis not present

## 2016-12-23 DIAGNOSIS — L97522 Non-pressure chronic ulcer of other part of left foot with fat layer exposed: Secondary | ICD-10-CM | POA: Diagnosis not present

## 2016-12-23 DIAGNOSIS — L89523 Pressure ulcer of left ankle, stage 3: Secondary | ICD-10-CM | POA: Diagnosis not present

## 2016-12-23 DIAGNOSIS — L97221 Non-pressure chronic ulcer of left calf limited to breakdown of skin: Secondary | ICD-10-CM | POA: Diagnosis not present

## 2016-12-23 DIAGNOSIS — L97512 Non-pressure chronic ulcer of other part of right foot with fat layer exposed: Secondary | ICD-10-CM | POA: Diagnosis not present

## 2016-12-27 DIAGNOSIS — L03116 Cellulitis of left lower limb: Secondary | ICD-10-CM | POA: Diagnosis not present

## 2016-12-27 DIAGNOSIS — I10 Essential (primary) hypertension: Secondary | ICD-10-CM | POA: Diagnosis not present

## 2016-12-27 DIAGNOSIS — L89523 Pressure ulcer of left ankle, stage 3: Secondary | ICD-10-CM | POA: Diagnosis not present

## 2016-12-27 DIAGNOSIS — I87332 Chronic venous hypertension (idiopathic) with ulcer and inflammation of left lower extremity: Secondary | ICD-10-CM | POA: Diagnosis not present

## 2016-12-27 DIAGNOSIS — X58XXXA Exposure to other specified factors, initial encounter: Secondary | ICD-10-CM | POA: Diagnosis not present

## 2016-12-27 DIAGNOSIS — L97522 Non-pressure chronic ulcer of other part of left foot with fat layer exposed: Secondary | ICD-10-CM | POA: Diagnosis not present

## 2016-12-27 DIAGNOSIS — L97512 Non-pressure chronic ulcer of other part of right foot with fat layer exposed: Secondary | ICD-10-CM | POA: Diagnosis not present

## 2016-12-27 DIAGNOSIS — S91302A Unspecified open wound, left foot, initial encounter: Secondary | ICD-10-CM | POA: Diagnosis not present

## 2016-12-27 DIAGNOSIS — G4730 Sleep apnea, unspecified: Secondary | ICD-10-CM | POA: Diagnosis not present

## 2016-12-27 DIAGNOSIS — G82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, complete: Secondary | ICD-10-CM | POA: Diagnosis not present

## 2016-12-27 DIAGNOSIS — S91104A Unspecified open wound of right lesser toe(s) without damage to nail, initial encounter: Secondary | ICD-10-CM | POA: Diagnosis not present

## 2016-12-28 DIAGNOSIS — R300 Dysuria: Secondary | ICD-10-CM | POA: Diagnosis not present

## 2016-12-28 DIAGNOSIS — N3001 Acute cystitis with hematuria: Secondary | ICD-10-CM | POA: Diagnosis not present

## 2016-12-30 DIAGNOSIS — S91302A Unspecified open wound, left foot, initial encounter: Secondary | ICD-10-CM | POA: Diagnosis not present

## 2016-12-30 DIAGNOSIS — X58XXXA Exposure to other specified factors, initial encounter: Secondary | ICD-10-CM | POA: Diagnosis not present

## 2016-12-30 DIAGNOSIS — L89523 Pressure ulcer of left ankle, stage 3: Secondary | ICD-10-CM | POA: Diagnosis not present

## 2016-12-30 DIAGNOSIS — I87332 Chronic venous hypertension (idiopathic) with ulcer and inflammation of left lower extremity: Secondary | ICD-10-CM | POA: Diagnosis not present

## 2016-12-30 DIAGNOSIS — L97512 Non-pressure chronic ulcer of other part of right foot with fat layer exposed: Secondary | ICD-10-CM | POA: Diagnosis not present

## 2016-12-30 DIAGNOSIS — S91104A Unspecified open wound of right lesser toe(s) without damage to nail, initial encounter: Secondary | ICD-10-CM | POA: Diagnosis not present

## 2016-12-30 DIAGNOSIS — L97522 Non-pressure chronic ulcer of other part of left foot with fat layer exposed: Secondary | ICD-10-CM | POA: Diagnosis not present

## 2016-12-30 DIAGNOSIS — L03116 Cellulitis of left lower limb: Secondary | ICD-10-CM | POA: Diagnosis not present

## 2016-12-30 DIAGNOSIS — I10 Essential (primary) hypertension: Secondary | ICD-10-CM | POA: Diagnosis not present

## 2016-12-30 DIAGNOSIS — G82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, complete: Secondary | ICD-10-CM | POA: Diagnosis not present

## 2016-12-30 DIAGNOSIS — G4730 Sleep apnea, unspecified: Secondary | ICD-10-CM | POA: Diagnosis not present

## 2017-01-03 DIAGNOSIS — I10 Essential (primary) hypertension: Secondary | ICD-10-CM | POA: Diagnosis not present

## 2017-01-03 DIAGNOSIS — S81802A Unspecified open wound, left lower leg, initial encounter: Secondary | ICD-10-CM | POA: Diagnosis not present

## 2017-01-03 DIAGNOSIS — X58XXXA Exposure to other specified factors, initial encounter: Secondary | ICD-10-CM | POA: Diagnosis not present

## 2017-01-03 DIAGNOSIS — L97512 Non-pressure chronic ulcer of other part of right foot with fat layer exposed: Secondary | ICD-10-CM | POA: Diagnosis not present

## 2017-01-03 DIAGNOSIS — L97522 Non-pressure chronic ulcer of other part of left foot with fat layer exposed: Secondary | ICD-10-CM | POA: Diagnosis not present

## 2017-01-03 DIAGNOSIS — L89523 Pressure ulcer of left ankle, stage 3: Secondary | ICD-10-CM | POA: Diagnosis not present

## 2017-01-03 DIAGNOSIS — S91104A Unspecified open wound of right lesser toe(s) without damage to nail, initial encounter: Secondary | ICD-10-CM | POA: Diagnosis not present

## 2017-01-03 DIAGNOSIS — I87332 Chronic venous hypertension (idiopathic) with ulcer and inflammation of left lower extremity: Secondary | ICD-10-CM | POA: Diagnosis not present

## 2017-01-03 DIAGNOSIS — G4730 Sleep apnea, unspecified: Secondary | ICD-10-CM | POA: Diagnosis not present

## 2017-01-03 DIAGNOSIS — S91302A Unspecified open wound, left foot, initial encounter: Secondary | ICD-10-CM | POA: Diagnosis not present

## 2017-01-03 DIAGNOSIS — L03116 Cellulitis of left lower limb: Secondary | ICD-10-CM | POA: Diagnosis not present

## 2017-01-03 DIAGNOSIS — G82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, complete: Secondary | ICD-10-CM | POA: Diagnosis not present

## 2017-01-04 DIAGNOSIS — L89513 Pressure ulcer of right ankle, stage 3: Secondary | ICD-10-CM | POA: Diagnosis not present

## 2017-01-04 DIAGNOSIS — L97212 Non-pressure chronic ulcer of right calf with fat layer exposed: Secondary | ICD-10-CM | POA: Diagnosis not present

## 2017-01-04 DIAGNOSIS — A4902 Methicillin resistant Staphylococcus aureus infection, unspecified site: Secondary | ICD-10-CM | POA: Diagnosis not present

## 2017-01-04 DIAGNOSIS — L89523 Pressure ulcer of left ankle, stage 3: Secondary | ICD-10-CM | POA: Diagnosis not present

## 2017-01-06 DIAGNOSIS — L03116 Cellulitis of left lower limb: Secondary | ICD-10-CM | POA: Diagnosis not present

## 2017-01-06 DIAGNOSIS — L89523 Pressure ulcer of left ankle, stage 3: Secondary | ICD-10-CM | POA: Diagnosis not present

## 2017-01-06 DIAGNOSIS — G82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, complete: Secondary | ICD-10-CM | POA: Diagnosis not present

## 2017-01-06 DIAGNOSIS — N3289 Other specified disorders of bladder: Secondary | ICD-10-CM | POA: Diagnosis not present

## 2017-01-06 DIAGNOSIS — G4730 Sleep apnea, unspecified: Secondary | ICD-10-CM | POA: Diagnosis not present

## 2017-01-06 DIAGNOSIS — X58XXXA Exposure to other specified factors, initial encounter: Secondary | ICD-10-CM | POA: Diagnosis not present

## 2017-01-06 DIAGNOSIS — I87332 Chronic venous hypertension (idiopathic) with ulcer and inflammation of left lower extremity: Secondary | ICD-10-CM | POA: Diagnosis not present

## 2017-01-06 DIAGNOSIS — G8220 Paraplegia, unspecified: Secondary | ICD-10-CM | POA: Diagnosis not present

## 2017-01-06 DIAGNOSIS — I10 Essential (primary) hypertension: Secondary | ICD-10-CM | POA: Diagnosis not present

## 2017-01-06 DIAGNOSIS — I87312 Chronic venous hypertension (idiopathic) with ulcer of left lower extremity: Secondary | ICD-10-CM | POA: Diagnosis not present

## 2017-01-06 DIAGNOSIS — S91104A Unspecified open wound of right lesser toe(s) without damage to nail, initial encounter: Secondary | ICD-10-CM | POA: Diagnosis not present

## 2017-01-06 DIAGNOSIS — L97222 Non-pressure chronic ulcer of left calf with fat layer exposed: Secondary | ICD-10-CM | POA: Diagnosis not present

## 2017-01-06 DIAGNOSIS — L97522 Non-pressure chronic ulcer of other part of left foot with fat layer exposed: Secondary | ICD-10-CM | POA: Diagnosis not present

## 2017-01-06 DIAGNOSIS — S91302A Unspecified open wound, left foot, initial encounter: Secondary | ICD-10-CM | POA: Diagnosis not present

## 2017-01-06 DIAGNOSIS — L97512 Non-pressure chronic ulcer of other part of right foot with fat layer exposed: Secondary | ICD-10-CM | POA: Diagnosis not present

## 2017-01-10 DIAGNOSIS — L97522 Non-pressure chronic ulcer of other part of left foot with fat layer exposed: Secondary | ICD-10-CM | POA: Diagnosis not present

## 2017-01-10 DIAGNOSIS — L89523 Pressure ulcer of left ankle, stage 3: Secondary | ICD-10-CM | POA: Diagnosis not present

## 2017-01-10 DIAGNOSIS — S91104A Unspecified open wound of right lesser toe(s) without damage to nail, initial encounter: Secondary | ICD-10-CM | POA: Diagnosis not present

## 2017-01-10 DIAGNOSIS — S91302A Unspecified open wound, left foot, initial encounter: Secondary | ICD-10-CM | POA: Diagnosis not present

## 2017-01-10 DIAGNOSIS — L03116 Cellulitis of left lower limb: Secondary | ICD-10-CM | POA: Diagnosis not present

## 2017-01-10 DIAGNOSIS — G82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, complete: Secondary | ICD-10-CM | POA: Diagnosis not present

## 2017-01-10 DIAGNOSIS — G4730 Sleep apnea, unspecified: Secondary | ICD-10-CM | POA: Diagnosis not present

## 2017-01-10 DIAGNOSIS — L97512 Non-pressure chronic ulcer of other part of right foot with fat layer exposed: Secondary | ICD-10-CM | POA: Diagnosis not present

## 2017-01-10 DIAGNOSIS — I87332 Chronic venous hypertension (idiopathic) with ulcer and inflammation of left lower extremity: Secondary | ICD-10-CM | POA: Diagnosis not present

## 2017-01-10 DIAGNOSIS — X58XXXA Exposure to other specified factors, initial encounter: Secondary | ICD-10-CM | POA: Diagnosis not present

## 2017-01-10 DIAGNOSIS — I10 Essential (primary) hypertension: Secondary | ICD-10-CM | POA: Diagnosis not present

## 2017-01-13 DIAGNOSIS — L97512 Non-pressure chronic ulcer of other part of right foot with fat layer exposed: Secondary | ICD-10-CM | POA: Diagnosis not present

## 2017-01-13 DIAGNOSIS — I10 Essential (primary) hypertension: Secondary | ICD-10-CM | POA: Diagnosis not present

## 2017-01-13 DIAGNOSIS — G82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, complete: Secondary | ICD-10-CM | POA: Diagnosis not present

## 2017-01-13 DIAGNOSIS — X58XXXA Exposure to other specified factors, initial encounter: Secondary | ICD-10-CM | POA: Diagnosis not present

## 2017-01-13 DIAGNOSIS — G4730 Sleep apnea, unspecified: Secondary | ICD-10-CM | POA: Diagnosis not present

## 2017-01-13 DIAGNOSIS — S91104A Unspecified open wound of right lesser toe(s) without damage to nail, initial encounter: Secondary | ICD-10-CM | POA: Diagnosis not present

## 2017-01-13 DIAGNOSIS — I87312 Chronic venous hypertension (idiopathic) with ulcer of left lower extremity: Secondary | ICD-10-CM | POA: Diagnosis not present

## 2017-01-13 DIAGNOSIS — L97222 Non-pressure chronic ulcer of left calf with fat layer exposed: Secondary | ICD-10-CM | POA: Diagnosis not present

## 2017-01-13 DIAGNOSIS — I87332 Chronic venous hypertension (idiopathic) with ulcer and inflammation of left lower extremity: Secondary | ICD-10-CM | POA: Diagnosis not present

## 2017-01-13 DIAGNOSIS — L89523 Pressure ulcer of left ankle, stage 3: Secondary | ICD-10-CM | POA: Diagnosis not present

## 2017-01-13 DIAGNOSIS — L03116 Cellulitis of left lower limb: Secondary | ICD-10-CM | POA: Diagnosis not present

## 2017-01-13 DIAGNOSIS — L97522 Non-pressure chronic ulcer of other part of left foot with fat layer exposed: Secondary | ICD-10-CM | POA: Diagnosis not present

## 2017-01-13 DIAGNOSIS — S91302A Unspecified open wound, left foot, initial encounter: Secondary | ICD-10-CM | POA: Diagnosis not present

## 2017-01-17 DIAGNOSIS — L03116 Cellulitis of left lower limb: Secondary | ICD-10-CM | POA: Diagnosis not present

## 2017-01-17 DIAGNOSIS — L97222 Non-pressure chronic ulcer of left calf with fat layer exposed: Secondary | ICD-10-CM | POA: Diagnosis not present

## 2017-01-17 DIAGNOSIS — L97522 Non-pressure chronic ulcer of other part of left foot with fat layer exposed: Secondary | ICD-10-CM | POA: Diagnosis not present

## 2017-01-17 DIAGNOSIS — L89523 Pressure ulcer of left ankle, stage 3: Secondary | ICD-10-CM | POA: Diagnosis not present

## 2017-01-17 DIAGNOSIS — G82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, complete: Secondary | ICD-10-CM | POA: Diagnosis not present

## 2017-01-17 DIAGNOSIS — G4730 Sleep apnea, unspecified: Secondary | ICD-10-CM | POA: Diagnosis not present

## 2017-01-17 DIAGNOSIS — S91104A Unspecified open wound of right lesser toe(s) without damage to nail, initial encounter: Secondary | ICD-10-CM | POA: Diagnosis not present

## 2017-01-17 DIAGNOSIS — S91302A Unspecified open wound, left foot, initial encounter: Secondary | ICD-10-CM | POA: Diagnosis not present

## 2017-01-17 DIAGNOSIS — I10 Essential (primary) hypertension: Secondary | ICD-10-CM | POA: Diagnosis not present

## 2017-01-17 DIAGNOSIS — I87332 Chronic venous hypertension (idiopathic) with ulcer and inflammation of left lower extremity: Secondary | ICD-10-CM | POA: Diagnosis not present

## 2017-01-17 DIAGNOSIS — I87312 Chronic venous hypertension (idiopathic) with ulcer of left lower extremity: Secondary | ICD-10-CM | POA: Diagnosis not present

## 2017-01-17 DIAGNOSIS — X58XXXA Exposure to other specified factors, initial encounter: Secondary | ICD-10-CM | POA: Diagnosis not present

## 2017-01-17 DIAGNOSIS — L97512 Non-pressure chronic ulcer of other part of right foot with fat layer exposed: Secondary | ICD-10-CM | POA: Diagnosis not present

## 2017-01-20 ENCOUNTER — Encounter (HOSPITAL_BASED_OUTPATIENT_CLINIC_OR_DEPARTMENT_OTHER): Payer: BLUE CROSS/BLUE SHIELD | Attending: Internal Medicine

## 2017-01-20 DIAGNOSIS — L89893 Pressure ulcer of other site, stage 3: Secondary | ICD-10-CM | POA: Diagnosis not present

## 2017-01-20 DIAGNOSIS — I10 Essential (primary) hypertension: Secondary | ICD-10-CM | POA: Insufficient documentation

## 2017-01-20 DIAGNOSIS — I87332 Chronic venous hypertension (idiopathic) with ulcer and inflammation of left lower extremity: Secondary | ICD-10-CM | POA: Diagnosis not present

## 2017-01-20 DIAGNOSIS — G82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, complete: Secondary | ICD-10-CM | POA: Insufficient documentation

## 2017-01-20 DIAGNOSIS — L97522 Non-pressure chronic ulcer of other part of left foot with fat layer exposed: Secondary | ICD-10-CM | POA: Diagnosis not present

## 2017-01-20 DIAGNOSIS — G4730 Sleep apnea, unspecified: Secondary | ICD-10-CM | POA: Diagnosis not present

## 2017-01-20 DIAGNOSIS — L97512 Non-pressure chronic ulcer of other part of right foot with fat layer exposed: Secondary | ICD-10-CM | POA: Insufficient documentation

## 2017-01-20 DIAGNOSIS — L97222 Non-pressure chronic ulcer of left calf with fat layer exposed: Secondary | ICD-10-CM | POA: Diagnosis not present

## 2017-01-20 DIAGNOSIS — I87312 Chronic venous hypertension (idiopathic) with ulcer of left lower extremity: Secondary | ICD-10-CM | POA: Diagnosis not present

## 2017-01-24 DIAGNOSIS — L97522 Non-pressure chronic ulcer of other part of left foot with fat layer exposed: Secondary | ICD-10-CM | POA: Diagnosis not present

## 2017-01-24 DIAGNOSIS — G4730 Sleep apnea, unspecified: Secondary | ICD-10-CM | POA: Diagnosis not present

## 2017-01-24 DIAGNOSIS — L97512 Non-pressure chronic ulcer of other part of right foot with fat layer exposed: Secondary | ICD-10-CM | POA: Diagnosis not present

## 2017-01-24 DIAGNOSIS — G82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, complete: Secondary | ICD-10-CM | POA: Diagnosis not present

## 2017-01-24 DIAGNOSIS — I87332 Chronic venous hypertension (idiopathic) with ulcer and inflammation of left lower extremity: Secondary | ICD-10-CM | POA: Diagnosis not present

## 2017-01-24 DIAGNOSIS — I10 Essential (primary) hypertension: Secondary | ICD-10-CM | POA: Diagnosis not present

## 2017-01-24 DIAGNOSIS — L89893 Pressure ulcer of other site, stage 3: Secondary | ICD-10-CM | POA: Diagnosis not present

## 2017-01-27 DIAGNOSIS — L97512 Non-pressure chronic ulcer of other part of right foot with fat layer exposed: Secondary | ICD-10-CM | POA: Diagnosis not present

## 2017-01-27 DIAGNOSIS — L97222 Non-pressure chronic ulcer of left calf with fat layer exposed: Secondary | ICD-10-CM | POA: Diagnosis not present

## 2017-01-27 DIAGNOSIS — G4730 Sleep apnea, unspecified: Secondary | ICD-10-CM | POA: Diagnosis not present

## 2017-01-27 DIAGNOSIS — L97522 Non-pressure chronic ulcer of other part of left foot with fat layer exposed: Secondary | ICD-10-CM | POA: Diagnosis not present

## 2017-01-27 DIAGNOSIS — I87332 Chronic venous hypertension (idiopathic) with ulcer and inflammation of left lower extremity: Secondary | ICD-10-CM | POA: Diagnosis not present

## 2017-01-27 DIAGNOSIS — G82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, complete: Secondary | ICD-10-CM | POA: Diagnosis not present

## 2017-01-27 DIAGNOSIS — L89893 Pressure ulcer of other site, stage 3: Secondary | ICD-10-CM | POA: Diagnosis not present

## 2017-01-27 DIAGNOSIS — I10 Essential (primary) hypertension: Secondary | ICD-10-CM | POA: Diagnosis not present

## 2017-01-31 DIAGNOSIS — I10 Essential (primary) hypertension: Secondary | ICD-10-CM | POA: Diagnosis not present

## 2017-01-31 DIAGNOSIS — G82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, complete: Secondary | ICD-10-CM | POA: Diagnosis not present

## 2017-01-31 DIAGNOSIS — L89893 Pressure ulcer of other site, stage 3: Secondary | ICD-10-CM | POA: Diagnosis not present

## 2017-01-31 DIAGNOSIS — L97512 Non-pressure chronic ulcer of other part of right foot with fat layer exposed: Secondary | ICD-10-CM | POA: Diagnosis not present

## 2017-01-31 DIAGNOSIS — L97522 Non-pressure chronic ulcer of other part of left foot with fat layer exposed: Secondary | ICD-10-CM | POA: Diagnosis not present

## 2017-01-31 DIAGNOSIS — I87332 Chronic venous hypertension (idiopathic) with ulcer and inflammation of left lower extremity: Secondary | ICD-10-CM | POA: Diagnosis not present

## 2017-01-31 DIAGNOSIS — G4730 Sleep apnea, unspecified: Secondary | ICD-10-CM | POA: Diagnosis not present

## 2017-02-03 DIAGNOSIS — L89893 Pressure ulcer of other site, stage 3: Secondary | ICD-10-CM | POA: Diagnosis not present

## 2017-02-03 DIAGNOSIS — G82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, complete: Secondary | ICD-10-CM | POA: Diagnosis not present

## 2017-02-03 DIAGNOSIS — L97522 Non-pressure chronic ulcer of other part of left foot with fat layer exposed: Secondary | ICD-10-CM | POA: Diagnosis not present

## 2017-02-03 DIAGNOSIS — I10 Essential (primary) hypertension: Secondary | ICD-10-CM | POA: Diagnosis not present

## 2017-02-03 DIAGNOSIS — I87332 Chronic venous hypertension (idiopathic) with ulcer and inflammation of left lower extremity: Secondary | ICD-10-CM | POA: Diagnosis not present

## 2017-02-03 DIAGNOSIS — L97512 Non-pressure chronic ulcer of other part of right foot with fat layer exposed: Secondary | ICD-10-CM | POA: Diagnosis not present

## 2017-02-03 DIAGNOSIS — G4730 Sleep apnea, unspecified: Secondary | ICD-10-CM | POA: Diagnosis not present

## 2017-02-07 DIAGNOSIS — I87332 Chronic venous hypertension (idiopathic) with ulcer and inflammation of left lower extremity: Secondary | ICD-10-CM | POA: Diagnosis not present

## 2017-02-07 DIAGNOSIS — G82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, complete: Secondary | ICD-10-CM | POA: Diagnosis not present

## 2017-02-07 DIAGNOSIS — L97522 Non-pressure chronic ulcer of other part of left foot with fat layer exposed: Secondary | ICD-10-CM | POA: Diagnosis not present

## 2017-02-07 DIAGNOSIS — I10 Essential (primary) hypertension: Secondary | ICD-10-CM | POA: Diagnosis not present

## 2017-02-07 DIAGNOSIS — L89893 Pressure ulcer of other site, stage 3: Secondary | ICD-10-CM | POA: Diagnosis not present

## 2017-02-07 DIAGNOSIS — L97512 Non-pressure chronic ulcer of other part of right foot with fat layer exposed: Secondary | ICD-10-CM | POA: Diagnosis not present

## 2017-02-07 DIAGNOSIS — G4730 Sleep apnea, unspecified: Secondary | ICD-10-CM | POA: Diagnosis not present

## 2017-02-10 DIAGNOSIS — L89893 Pressure ulcer of other site, stage 3: Secondary | ICD-10-CM | POA: Diagnosis not present

## 2017-02-10 DIAGNOSIS — I87332 Chronic venous hypertension (idiopathic) with ulcer and inflammation of left lower extremity: Secondary | ICD-10-CM | POA: Diagnosis not present

## 2017-02-10 DIAGNOSIS — L97222 Non-pressure chronic ulcer of left calf with fat layer exposed: Secondary | ICD-10-CM | POA: Diagnosis not present

## 2017-02-10 DIAGNOSIS — I87312 Chronic venous hypertension (idiopathic) with ulcer of left lower extremity: Secondary | ICD-10-CM | POA: Diagnosis not present

## 2017-02-10 DIAGNOSIS — G4730 Sleep apnea, unspecified: Secondary | ICD-10-CM | POA: Diagnosis not present

## 2017-02-10 DIAGNOSIS — L97522 Non-pressure chronic ulcer of other part of left foot with fat layer exposed: Secondary | ICD-10-CM | POA: Diagnosis not present

## 2017-02-10 DIAGNOSIS — L97512 Non-pressure chronic ulcer of other part of right foot with fat layer exposed: Secondary | ICD-10-CM | POA: Diagnosis not present

## 2017-02-10 DIAGNOSIS — I10 Essential (primary) hypertension: Secondary | ICD-10-CM | POA: Diagnosis not present

## 2017-02-10 DIAGNOSIS — G82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, complete: Secondary | ICD-10-CM | POA: Diagnosis not present

## 2017-02-14 DIAGNOSIS — L97522 Non-pressure chronic ulcer of other part of left foot with fat layer exposed: Secondary | ICD-10-CM | POA: Diagnosis not present

## 2017-02-14 DIAGNOSIS — G82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, complete: Secondary | ICD-10-CM | POA: Diagnosis not present

## 2017-02-14 DIAGNOSIS — F331 Major depressive disorder, recurrent, moderate: Secondary | ICD-10-CM | POA: Diagnosis not present

## 2017-02-14 DIAGNOSIS — L89893 Pressure ulcer of other site, stage 3: Secondary | ICD-10-CM | POA: Diagnosis not present

## 2017-02-14 DIAGNOSIS — G4730 Sleep apnea, unspecified: Secondary | ICD-10-CM | POA: Diagnosis not present

## 2017-02-14 DIAGNOSIS — L97512 Non-pressure chronic ulcer of other part of right foot with fat layer exposed: Secondary | ICD-10-CM | POA: Diagnosis not present

## 2017-02-14 DIAGNOSIS — I10 Essential (primary) hypertension: Secondary | ICD-10-CM | POA: Diagnosis not present

## 2017-02-14 DIAGNOSIS — L97519 Non-pressure chronic ulcer of other part of right foot with unspecified severity: Secondary | ICD-10-CM | POA: Diagnosis not present

## 2017-02-14 DIAGNOSIS — I87332 Chronic venous hypertension (idiopathic) with ulcer and inflammation of left lower extremity: Secondary | ICD-10-CM | POA: Diagnosis not present

## 2017-02-14 DIAGNOSIS — L97229 Non-pressure chronic ulcer of left calf with unspecified severity: Secondary | ICD-10-CM | POA: Diagnosis not present

## 2017-02-14 DIAGNOSIS — L97529 Non-pressure chronic ulcer of other part of left foot with unspecified severity: Secondary | ICD-10-CM | POA: Diagnosis not present

## 2017-02-14 DIAGNOSIS — I87312 Chronic venous hypertension (idiopathic) with ulcer of left lower extremity: Secondary | ICD-10-CM | POA: Diagnosis not present

## 2017-02-17 ENCOUNTER — Ambulatory Visit (INDEPENDENT_AMBULATORY_CARE_PROVIDER_SITE_OTHER): Payer: BLUE CROSS/BLUE SHIELD | Admitting: Neurology

## 2017-02-17 ENCOUNTER — Encounter: Payer: Self-pay | Admitting: Neurology

## 2017-02-17 VITALS — BP 138/74 | HR 102 | Ht 70.0 in | Wt 280.0 lb

## 2017-02-17 DIAGNOSIS — I83003 Varicose veins of unspecified lower extremity with ulcer of ankle: Secondary | ICD-10-CM

## 2017-02-17 DIAGNOSIS — L97524 Non-pressure chronic ulcer of other part of left foot with necrosis of bone: Secondary | ICD-10-CM

## 2017-02-17 DIAGNOSIS — G8220 Paraplegia, unspecified: Secondary | ICD-10-CM | POA: Diagnosis not present

## 2017-02-17 DIAGNOSIS — I10 Essential (primary) hypertension: Secondary | ICD-10-CM | POA: Diagnosis not present

## 2017-02-17 DIAGNOSIS — G4730 Sleep apnea, unspecified: Secondary | ICD-10-CM | POA: Diagnosis not present

## 2017-02-17 DIAGNOSIS — L97512 Non-pressure chronic ulcer of other part of right foot with fat layer exposed: Secondary | ICD-10-CM | POA: Diagnosis not present

## 2017-02-17 DIAGNOSIS — G4733 Obstructive sleep apnea (adult) (pediatric): Secondary | ICD-10-CM

## 2017-02-17 DIAGNOSIS — I87332 Chronic venous hypertension (idiopathic) with ulcer and inflammation of left lower extremity: Secondary | ICD-10-CM | POA: Diagnosis not present

## 2017-02-17 DIAGNOSIS — L97304 Non-pressure chronic ulcer of unspecified ankle with necrosis of bone: Secondary | ICD-10-CM

## 2017-02-17 DIAGNOSIS — G82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, complete: Secondary | ICD-10-CM | POA: Diagnosis not present

## 2017-02-17 DIAGNOSIS — R0683 Snoring: Secondary | ICD-10-CM | POA: Diagnosis not present

## 2017-02-17 DIAGNOSIS — L89893 Pressure ulcer of other site, stage 3: Secondary | ICD-10-CM | POA: Diagnosis not present

## 2017-02-17 DIAGNOSIS — L97522 Non-pressure chronic ulcer of other part of left foot with fat layer exposed: Secondary | ICD-10-CM | POA: Diagnosis not present

## 2017-02-17 NOTE — Patient Instructions (Signed)
CPAP and BiPAP Information CPAP and BiPAP are methods of helping a person breathe with the use of air pressure. CPAP stands for "continuous positive airway pressure." BiPAP stands for "bi-level positive airway pressure." In both methods, air is blown through your nose or mouth and into your air passages to help you breathe well. CPAP and BiPAP use different amounts of pressure to blow air. With CPAP, the amount of pressure stays the same while you breathe in and out. With BiPAP, the amount of pressure is increased when you breathe in (inhale) so that you can take larger breaths. Your health care provider will recommend whether CPAP or BiPAP would be more helpful for you. Why are CPAP and BiPAP treatments used? CPAP or BiPAP can be helpful if you have:  Sleep apnea.  Chronic obstructive pulmonary disease (COPD).  Heart failure.  Medical conditions that weaken the muscles of the chest including muscular dystrophy, or neurological diseases such as amyotrophic lateral sclerosis (ALS).  Other problems that cause breathing to be weak, abnormal, or difficult. CPAP is most commonly used for obstructive sleep apnea (OSA) to keep the airways from collapsing when the muscles relax during sleep. How is CPAP or BiPAP administered? Both CPAP and BiPAP are provided by a small machine with a flexible plastic tube that attaches to a plastic mask. You wear the mask. Air is blown through the mask into your nose or mouth. The amount of pressure that is used to blow the air can be adjusted on the machine. Your health care provider will determine the pressure setting that should be used based on your individual needs. When should CPAP or BiPAP be used? In most cases, the mask only needs to be worn during sleep. Generally, the mask needs to be worn throughout the night and during any daytime naps. People with certain medical conditions may also need to wear the mask at other times when they are awake. Follow  instructions from your health care provider about when to use the machine. What are some tips for using the mask?  Because the mask needs to be snug, some people feel trapped or closed-in (claustrophobic) when first using the mask. If you feel this way, you may need to get used to the mask. One way to do this is by holding the mask loosely over your nose or mouth and then gradually applying the mask more snugly. You can also gradually increase the amount of time that you use the mask.  Masks are available in various types and sizes. Some fit over your mouth and nose while others fit over just your nose. If your mask does not fit well, talk with your health care provider about getting a different one.  If you are using a mask that fits over your nose and you tend to breathe through your mouth, a chin strap may be applied to help keep your mouth closed.  The CPAP and BiPAP machines have alarms that may sound if the mask comes off or develops a leak.  If you have trouble with the mask, it is very important that you talk with your health care provider about finding a way to make the mask easier to tolerate. Do not stop using the mask. Stopping the use of the mask could have a negative impact on your health. What are some tips for using the machine?  Place your CPAP or BiPAP machine on a secure table or stand near an electrical outlet.  Know where the on/off switch   is located on the machine.  Follow instructions from your health care provider about how to set the pressure on your machine and when you should use it.  Do not eat or drink while the CPAP or BiPAP machine is on. Food or fluids could get pushed into your lungs by the pressure of the CPAP or BiPAP.  Do not smoke. Tobacco smoke residue can damage the machine.  For home use, CPAP and BiPAP machines can be rented or purchased through home health care companies. Many different brands of machines are available. Renting a machine before  purchasing may help you find out which particular machine works well for you.  Keep the CPAP or BiPAP machine and attachments clean. Ask your health care provider for specific instructions. Get help right away if:  You have redness or open areas around your nose or mouth where the mask fits.  You have trouble using the CPAP or BiPAP machine.  You cannot tolerate wearing the CPAP or BiPAP mask.  You have pain, discomfort, and bloating in your abdomen. Summary  CPAP and BiPAP are methods of helping a person breathe with the use of air pressure.  Both CPAP and BiPAP are provided by a small machine with a flexible plastic tube that attaches to a plastic mask.  If you have trouble with the mask, it is very important that you talk with your health care provider about finding a way to make the mask easier to tolerate. This information is not intended to replace advice given to you by your health care provider. Make sure you discuss any questions you have with your health care provider. Document Released: 08/06/2004 Document Revised: 09/27/2016 Document Reviewed: 09/27/2016 Elsevier Interactive Patient Education  2017 [employer]  
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
## 2017-02-21 ENCOUNTER — Encounter (HOSPITAL_BASED_OUTPATIENT_CLINIC_OR_DEPARTMENT_OTHER): Payer: BLUE CROSS/BLUE SHIELD | Attending: Internal Medicine

## 2017-02-21 DIAGNOSIS — L97512 Non-pressure chronic ulcer of other part of right foot with fat layer exposed: Secondary | ICD-10-CM | POA: Insufficient documentation

## 2017-02-21 DIAGNOSIS — L97522 Non-pressure chronic ulcer of other part of left foot with fat layer exposed: Secondary | ICD-10-CM | POA: Diagnosis not present

## 2017-02-21 DIAGNOSIS — I890 Lymphedema, not elsewhere classified: Secondary | ICD-10-CM | POA: Insufficient documentation

## 2017-02-21 DIAGNOSIS — L03115 Cellulitis of right lower limb: Secondary | ICD-10-CM | POA: Diagnosis not present

## 2017-02-21 DIAGNOSIS — G4730 Sleep apnea, unspecified: Secondary | ICD-10-CM | POA: Diagnosis not present

## 2017-02-21 DIAGNOSIS — G8220 Paraplegia, unspecified: Secondary | ICD-10-CM | POA: Insufficient documentation

## 2017-02-21 DIAGNOSIS — I87332 Chronic venous hypertension (idiopathic) with ulcer and inflammation of left lower extremity: Secondary | ICD-10-CM | POA: Diagnosis not present

## 2017-02-21 DIAGNOSIS — L89899 Pressure ulcer of other site, unspecified stage: Secondary | ICD-10-CM | POA: Insufficient documentation

## 2017-02-24 DIAGNOSIS — L89899 Pressure ulcer of other site, unspecified stage: Secondary | ICD-10-CM | POA: Diagnosis not present

## 2017-02-24 DIAGNOSIS — I890 Lymphedema, not elsewhere classified: Secondary | ICD-10-CM | POA: Diagnosis not present

## 2017-02-24 DIAGNOSIS — G8220 Paraplegia, unspecified: Secondary | ICD-10-CM | POA: Diagnosis not present

## 2017-02-24 DIAGNOSIS — G4730 Sleep apnea, unspecified: Secondary | ICD-10-CM | POA: Diagnosis not present

## 2017-02-24 DIAGNOSIS — L97522 Non-pressure chronic ulcer of other part of left foot with fat layer exposed: Secondary | ICD-10-CM | POA: Diagnosis not present

## 2017-02-24 DIAGNOSIS — I87332 Chronic venous hypertension (idiopathic) with ulcer and inflammation of left lower extremity: Secondary | ICD-10-CM | POA: Diagnosis not present

## 2017-02-24 DIAGNOSIS — I87312 Chronic venous hypertension (idiopathic) with ulcer of left lower extremity: Secondary | ICD-10-CM | POA: Diagnosis not present

## 2017-02-24 DIAGNOSIS — L97222 Non-pressure chronic ulcer of left calf with fat layer exposed: Secondary | ICD-10-CM | POA: Diagnosis not present

## 2017-02-24 DIAGNOSIS — L03115 Cellulitis of right lower limb: Secondary | ICD-10-CM | POA: Diagnosis not present

## 2017-02-24 DIAGNOSIS — L97512 Non-pressure chronic ulcer of other part of right foot with fat layer exposed: Secondary | ICD-10-CM | POA: Diagnosis not present

## 2017-02-28 DIAGNOSIS — L97512 Non-pressure chronic ulcer of other part of right foot with fat layer exposed: Secondary | ICD-10-CM | POA: Diagnosis not present

## 2017-02-28 DIAGNOSIS — L03115 Cellulitis of right lower limb: Secondary | ICD-10-CM | POA: Diagnosis not present

## 2017-02-28 DIAGNOSIS — I890 Lymphedema, not elsewhere classified: Secondary | ICD-10-CM | POA: Diagnosis not present

## 2017-02-28 DIAGNOSIS — G4730 Sleep apnea, unspecified: Secondary | ICD-10-CM | POA: Diagnosis not present

## 2017-02-28 DIAGNOSIS — G8220 Paraplegia, unspecified: Secondary | ICD-10-CM | POA: Diagnosis not present

## 2017-02-28 DIAGNOSIS — I87332 Chronic venous hypertension (idiopathic) with ulcer and inflammation of left lower extremity: Secondary | ICD-10-CM | POA: Diagnosis not present

## 2017-02-28 DIAGNOSIS — L97522 Non-pressure chronic ulcer of other part of left foot with fat layer exposed: Secondary | ICD-10-CM | POA: Diagnosis not present

## 2017-02-28 DIAGNOSIS — L89899 Pressure ulcer of other site, unspecified stage: Secondary | ICD-10-CM | POA: Diagnosis not present

## 2017-03-03 DIAGNOSIS — L03115 Cellulitis of right lower limb: Secondary | ICD-10-CM | POA: Diagnosis not present

## 2017-03-03 DIAGNOSIS — I87332 Chronic venous hypertension (idiopathic) with ulcer and inflammation of left lower extremity: Secondary | ICD-10-CM | POA: Diagnosis not present

## 2017-03-03 DIAGNOSIS — L97522 Non-pressure chronic ulcer of other part of left foot with fat layer exposed: Secondary | ICD-10-CM | POA: Diagnosis not present

## 2017-03-03 DIAGNOSIS — G4730 Sleep apnea, unspecified: Secondary | ICD-10-CM | POA: Diagnosis not present

## 2017-03-03 DIAGNOSIS — G8220 Paraplegia, unspecified: Secondary | ICD-10-CM | POA: Diagnosis not present

## 2017-03-03 DIAGNOSIS — I87312 Chronic venous hypertension (idiopathic) with ulcer of left lower extremity: Secondary | ICD-10-CM | POA: Diagnosis not present

## 2017-03-03 DIAGNOSIS — L97222 Non-pressure chronic ulcer of left calf with fat layer exposed: Secondary | ICD-10-CM | POA: Diagnosis not present

## 2017-03-03 DIAGNOSIS — L89899 Pressure ulcer of other site, unspecified stage: Secondary | ICD-10-CM | POA: Diagnosis not present

## 2017-03-03 DIAGNOSIS — I890 Lymphedema, not elsewhere classified: Secondary | ICD-10-CM | POA: Diagnosis not present

## 2017-03-03 DIAGNOSIS — L97512 Non-pressure chronic ulcer of other part of right foot with fat layer exposed: Secondary | ICD-10-CM | POA: Diagnosis not present

## 2017-03-07 DIAGNOSIS — L89899 Pressure ulcer of other site, unspecified stage: Secondary | ICD-10-CM | POA: Diagnosis not present

## 2017-03-07 DIAGNOSIS — L03115 Cellulitis of right lower limb: Secondary | ICD-10-CM | POA: Diagnosis not present

## 2017-03-07 DIAGNOSIS — G4730 Sleep apnea, unspecified: Secondary | ICD-10-CM | POA: Diagnosis not present

## 2017-03-07 DIAGNOSIS — I87332 Chronic venous hypertension (idiopathic) with ulcer and inflammation of left lower extremity: Secondary | ICD-10-CM | POA: Diagnosis not present

## 2017-03-07 DIAGNOSIS — L97522 Non-pressure chronic ulcer of other part of left foot with fat layer exposed: Secondary | ICD-10-CM | POA: Diagnosis not present

## 2017-03-07 DIAGNOSIS — I890 Lymphedema, not elsewhere classified: Secondary | ICD-10-CM | POA: Diagnosis not present

## 2017-03-07 DIAGNOSIS — G8220 Paraplegia, unspecified: Secondary | ICD-10-CM | POA: Diagnosis not present

## 2017-03-07 DIAGNOSIS — L97512 Non-pressure chronic ulcer of other part of right foot with fat layer exposed: Secondary | ICD-10-CM | POA: Diagnosis not present

## 2017-03-10 DIAGNOSIS — L03115 Cellulitis of right lower limb: Secondary | ICD-10-CM | POA: Diagnosis not present

## 2017-03-10 DIAGNOSIS — L97222 Non-pressure chronic ulcer of left calf with fat layer exposed: Secondary | ICD-10-CM | POA: Diagnosis not present

## 2017-03-10 DIAGNOSIS — L97522 Non-pressure chronic ulcer of other part of left foot with fat layer exposed: Secondary | ICD-10-CM | POA: Diagnosis not present

## 2017-03-10 DIAGNOSIS — I87332 Chronic venous hypertension (idiopathic) with ulcer and inflammation of left lower extremity: Secondary | ICD-10-CM | POA: Diagnosis not present

## 2017-03-10 DIAGNOSIS — G4730 Sleep apnea, unspecified: Secondary | ICD-10-CM | POA: Diagnosis not present

## 2017-03-10 DIAGNOSIS — G8220 Paraplegia, unspecified: Secondary | ICD-10-CM | POA: Diagnosis not present

## 2017-03-10 DIAGNOSIS — L97512 Non-pressure chronic ulcer of other part of right foot with fat layer exposed: Secondary | ICD-10-CM | POA: Diagnosis not present

## 2017-03-10 DIAGNOSIS — I890 Lymphedema, not elsewhere classified: Secondary | ICD-10-CM | POA: Diagnosis not present

## 2017-03-10 DIAGNOSIS — I87312 Chronic venous hypertension (idiopathic) with ulcer of left lower extremity: Secondary | ICD-10-CM | POA: Diagnosis not present

## 2017-03-10 DIAGNOSIS — L89899 Pressure ulcer of other site, unspecified stage: Secondary | ICD-10-CM | POA: Diagnosis not present

## 2017-03-14 DIAGNOSIS — G4730 Sleep apnea, unspecified: Secondary | ICD-10-CM | POA: Diagnosis not present

## 2017-03-14 DIAGNOSIS — I890 Lymphedema, not elsewhere classified: Secondary | ICD-10-CM | POA: Diagnosis not present

## 2017-03-14 DIAGNOSIS — G8220 Paraplegia, unspecified: Secondary | ICD-10-CM | POA: Diagnosis not present

## 2017-03-14 DIAGNOSIS — L97522 Non-pressure chronic ulcer of other part of left foot with fat layer exposed: Secondary | ICD-10-CM | POA: Diagnosis not present

## 2017-03-14 DIAGNOSIS — L89899 Pressure ulcer of other site, unspecified stage: Secondary | ICD-10-CM | POA: Diagnosis not present

## 2017-03-14 DIAGNOSIS — L03115 Cellulitis of right lower limb: Secondary | ICD-10-CM | POA: Diagnosis not present

## 2017-03-14 DIAGNOSIS — L97512 Non-pressure chronic ulcer of other part of right foot with fat layer exposed: Secondary | ICD-10-CM | POA: Diagnosis not present

## 2017-03-14 DIAGNOSIS — I87332 Chronic venous hypertension (idiopathic) with ulcer and inflammation of left lower extremity: Secondary | ICD-10-CM | POA: Diagnosis not present

## 2017-03-17 DIAGNOSIS — G8220 Paraplegia, unspecified: Secondary | ICD-10-CM | POA: Diagnosis not present

## 2017-03-17 DIAGNOSIS — I87332 Chronic venous hypertension (idiopathic) with ulcer and inflammation of left lower extremity: Secondary | ICD-10-CM | POA: Diagnosis not present

## 2017-03-17 DIAGNOSIS — I890 Lymphedema, not elsewhere classified: Secondary | ICD-10-CM | POA: Diagnosis not present

## 2017-03-17 DIAGNOSIS — L97522 Non-pressure chronic ulcer of other part of left foot with fat layer exposed: Secondary | ICD-10-CM | POA: Diagnosis not present

## 2017-03-17 DIAGNOSIS — L97512 Non-pressure chronic ulcer of other part of right foot with fat layer exposed: Secondary | ICD-10-CM | POA: Diagnosis not present

## 2017-03-17 DIAGNOSIS — G4730 Sleep apnea, unspecified: Secondary | ICD-10-CM | POA: Diagnosis not present

## 2017-03-17 DIAGNOSIS — L03115 Cellulitis of right lower limb: Secondary | ICD-10-CM | POA: Diagnosis not present

## 2017-03-17 DIAGNOSIS — I87312 Chronic venous hypertension (idiopathic) with ulcer of left lower extremity: Secondary | ICD-10-CM | POA: Diagnosis not present

## 2017-03-17 DIAGNOSIS — L97222 Non-pressure chronic ulcer of left calf with fat layer exposed: Secondary | ICD-10-CM | POA: Diagnosis not present

## 2017-03-17 DIAGNOSIS — L89899 Pressure ulcer of other site, unspecified stage: Secondary | ICD-10-CM | POA: Diagnosis not present

## 2017-03-21 ENCOUNTER — Ambulatory Visit (INDEPENDENT_AMBULATORY_CARE_PROVIDER_SITE_OTHER): Payer: BLUE CROSS/BLUE SHIELD | Admitting: Neurology

## 2017-03-21 DIAGNOSIS — L03115 Cellulitis of right lower limb: Secondary | ICD-10-CM | POA: Diagnosis not present

## 2017-03-21 DIAGNOSIS — I87332 Chronic venous hypertension (idiopathic) with ulcer and inflammation of left lower extremity: Secondary | ICD-10-CM | POA: Diagnosis not present

## 2017-03-21 DIAGNOSIS — I83003 Varicose veins of unspecified lower extremity with ulcer of ankle: Secondary | ICD-10-CM

## 2017-03-21 DIAGNOSIS — L97512 Non-pressure chronic ulcer of other part of right foot with fat layer exposed: Secondary | ICD-10-CM | POA: Diagnosis not present

## 2017-03-21 DIAGNOSIS — L97522 Non-pressure chronic ulcer of other part of left foot with fat layer exposed: Secondary | ICD-10-CM | POA: Diagnosis not present

## 2017-03-21 DIAGNOSIS — G8220 Paraplegia, unspecified: Secondary | ICD-10-CM | POA: Diagnosis not present

## 2017-03-21 DIAGNOSIS — G4733 Obstructive sleep apnea (adult) (pediatric): Secondary | ICD-10-CM | POA: Diagnosis not present

## 2017-03-21 DIAGNOSIS — L97304 Non-pressure chronic ulcer of unspecified ankle with necrosis of bone: Secondary | ICD-10-CM

## 2017-03-21 DIAGNOSIS — G4730 Sleep apnea, unspecified: Secondary | ICD-10-CM | POA: Diagnosis not present

## 2017-03-21 DIAGNOSIS — L97524 Non-pressure chronic ulcer of other part of left foot with necrosis of bone: Secondary | ICD-10-CM

## 2017-03-21 DIAGNOSIS — L89899 Pressure ulcer of other site, unspecified stage: Secondary | ICD-10-CM | POA: Diagnosis not present

## 2017-03-21 DIAGNOSIS — R0683 Snoring: Secondary | ICD-10-CM

## 2017-03-21 DIAGNOSIS — I890 Lymphedema, not elsewhere classified: Secondary | ICD-10-CM | POA: Diagnosis not present

## 2017-03-23 NOTE — Procedures (Signed)
NAME: Kashawn Dirr            DOB: Dec 26, 1985 MEDICAL RECORD [id number]      DOS: 03/21/17 REFERRING PHYSICIAN: Milagros Evener, MD Study performed: HST/Out of Center Sleep test HISTORY:  JEREMI LOSITO is a 31 year old male patient, seen here as in a referral  from Dr. Radene Ou. Mr. Bosserman wife has noticed that he stops breathing at night, and she reports him to be excessively daytime sleepy. She has noted him to snore loudly. He has described times where he struggled to keep awake while driving. He has pulled over and took power naps. Mr. Bramel has been evaluated for sleep apnea; he was diagnosed and treated with CPAP at [facility] sleep center. He could not tolerate the CPAP interface, but was not "unhappy " with the pressure settings.  Epworth score 10, Fatigue severity score 17, depression score 2/15 - BMI 40.1    STUDY RESULTS:  Total Recording Time: 6 h 63m Total Apnea/Hypopnea Index (AHI) 7.2/ hr.  RDI 10.9  Average Oxygen Saturation: Sp02 90% Lowest Oxygen Saturation: Nadir SpO2 94%,  Hypoxemia 109 min at or below 89%, and 34 minute at 88% SpO2 . Average Heart Rate: 81 bpm, between 57 and 116 beats.  IMPRESSION: Mild Sleep Apnea with moderate hypoxemia, loud snoring. RECOMMENDATION: CPAP is one option, a dental device another possibility of treating this milder form of apnea with loud snoring.  Weight loss is recommended for lasting reduction of the AHI and snoring   I certify that I have reviewed the raw data recording prior to the issuance of this report in accordance with the standards of Accreditation of the American Academy of Sleep medicine (AASM)  Larey Seat, MD  03-23-2017 Diplomat, American Board of Psychiatry and Neurology Diplomat, American Board of [place] Director of [employer] [facility] at [facility]

## 2017-03-24 ENCOUNTER — Encounter (HOSPITAL_BASED_OUTPATIENT_CLINIC_OR_DEPARTMENT_OTHER): Payer: BLUE CROSS/BLUE SHIELD | Attending: Internal Medicine

## 2017-03-24 ENCOUNTER — Telehealth: Payer: Self-pay

## 2017-03-24 DIAGNOSIS — I87332 Chronic venous hypertension (idiopathic) with ulcer and inflammation of left lower extremity: Secondary | ICD-10-CM | POA: Diagnosis not present

## 2017-03-24 DIAGNOSIS — G82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, complete: Secondary | ICD-10-CM | POA: Diagnosis not present

## 2017-03-24 DIAGNOSIS — L03115 Cellulitis of right lower limb: Secondary | ICD-10-CM | POA: Insufficient documentation

## 2017-03-24 DIAGNOSIS — G4730 Sleep apnea, unspecified: Secondary | ICD-10-CM | POA: Insufficient documentation

## 2017-03-24 DIAGNOSIS — L89523 Pressure ulcer of left ankle, stage 3: Secondary | ICD-10-CM | POA: Diagnosis not present

## 2017-03-24 DIAGNOSIS — I10 Essential (primary) hypertension: Secondary | ICD-10-CM | POA: Insufficient documentation

## 2017-03-24 DIAGNOSIS — L97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: Secondary | ICD-10-CM | POA: Diagnosis not present

## 2017-03-24 DIAGNOSIS — L97222 Non-pressure chronic ulcer of left calf with fat layer exposed: Secondary | ICD-10-CM | POA: Diagnosis not present

## 2017-03-24 DIAGNOSIS — G4733 Obstructive sleep apnea (adult) (pediatric): Secondary | ICD-10-CM

## 2017-03-24 DIAGNOSIS — L97511 Non-pressure chronic ulcer of other part of right 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: Secondary | ICD-10-CM | POA: Insufficient documentation

## 2017-03-24 DIAGNOSIS — I87312 Chronic venous hypertension (idiopathic) with ulcer of left lower extremity: Secondary | ICD-10-CM | POA: Diagnosis not present

## 2017-03-24 NOTE — Telephone Encounter (Signed)
-----   Message from Larey Seat, MD sent at 03/23/2017  8:15 AM EDT ----- Patient has multiple options, weight loss is strongly recommended.  STUDY RESULTS:  Total Recording Time: 6 h 68m Total Apnea/Hypopnea Index (AHI) 7.2/ hr.  RDI 10.9  Average Oxygen Saturation: Sp02 90% Lowest Oxygen Saturation: Nadir SpO2 94%,  Hypoxemia 109 min at or below 89%, and 34 minute at 88% SpO2 . Average Heart Rate: 81 bpm, between 57 and 116 beats.  IMPRESSION: Mild Sleep Apnea with moderate hypoxemia, loud snoring. RECOMMENDATION: CPAP is one option, a dental device another possibility of treating this milder form of apnea with loud snoring.  Weight loss is recommended for lasting reduction of the AHI and snoring   I certify that I have reviewed the raw data recording prior to the issuance of this report in accordance with the standards of Accreditation of the [place] Academy of Sleep medicine (AASM) Larey Seat, MD  03-23-2017

## 2017-03-24 NOTE — Telephone Encounter (Signed)
I called pt to discuss his sleep study results. No answer, left a message asking him to call me back. 

## 2017-03-28 DIAGNOSIS — I87332 Chronic venous hypertension (idiopathic) with ulcer and inflammation of left lower extremity: Secondary | ICD-10-CM | POA: Diagnosis not present

## 2017-03-28 DIAGNOSIS — L97511 Non-pressure chronic ulcer of other part of right 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: Secondary | ICD-10-CM | POA: Diagnosis not present

## 2017-03-28 DIAGNOSIS — L89523 Pressure ulcer of left ankle, stage 3: Secondary | ICD-10-CM | POA: Diagnosis not present

## 2017-03-28 DIAGNOSIS — L03115 Cellulitis of right lower limb: Secondary | ICD-10-CM | POA: Diagnosis not present

## 2017-03-28 DIAGNOSIS — G82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, complete: Secondary | ICD-10-CM | POA: Diagnosis not present

## 2017-03-28 DIAGNOSIS — G4730 Sleep apnea, unspecified: Secondary | ICD-10-CM | POA: Diagnosis not present

## 2017-03-28 DIAGNOSIS — L97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: Secondary | ICD-10-CM | POA: Diagnosis not present

## 2017-03-28 DIAGNOSIS — I10 Essential (primary) hypertension: Secondary | ICD-10-CM | POA: Diagnosis not present

## 2017-03-30 NOTE — Telephone Encounter (Signed)
Patient called office returning RN's call.  Please call °

## 2017-03-30 NOTE — Addendum Note (Signed)
Addended by: Larey Seat on: 03/30/2017 05:30 PM   Modules accepted: Orders

## 2017-03-30 NOTE — Telephone Encounter (Signed)
I called pt. I advised him that his sleep study showed mild osa with moderate hypoxemia with loud snoring. DR. Brett Fairy recommends weight loss but cpap is one option and dental device is another. Pt says that he would like to pursue a cpap. I advised him that I will discuss with Dr. Brett Fairy perhaps a cpap titration study, if approved by his insurance, and if not, perhaps an auto pap trial. Pt says that in the meantime, he will lose weight. Pt declined a follow up visit to discuss sleep study results at this time, and just wants to pursue cpap and follow up after. Pt verbalized understanding of results. Pt had no questions at this time but was encouraged to call back if questions arise.

## 2017-03-30 NOTE — Telephone Encounter (Signed)
I ordered a CPAP titration.

## 2017-03-31 DIAGNOSIS — G4730 Sleep apnea, unspecified: Secondary | ICD-10-CM | POA: Diagnosis not present

## 2017-03-31 DIAGNOSIS — I87332 Chronic venous hypertension (idiopathic) with ulcer and inflammation of left lower extremity: Secondary | ICD-10-CM | POA: Diagnosis not present

## 2017-03-31 DIAGNOSIS — G82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, complete: Secondary | ICD-10-CM | POA: Diagnosis not present

## 2017-03-31 DIAGNOSIS — L97222 Non-pressure chronic ulcer of left calf with fat layer exposed: Secondary | ICD-10-CM | POA: Diagnosis not present

## 2017-03-31 DIAGNOSIS — I10 Essential (primary) hypertension: Secondary | ICD-10-CM | POA: Diagnosis not present

## 2017-03-31 DIAGNOSIS — L03115 Cellulitis of right lower limb: Secondary | ICD-10-CM | POA: Diagnosis not present

## 2017-03-31 DIAGNOSIS — L97511 Non-pressure chronic ulcer of other part of right 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: Secondary | ICD-10-CM | POA: Diagnosis not present

## 2017-03-31 DIAGNOSIS — L97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: Secondary | ICD-10-CM | POA: Diagnosis not present

## 2017-03-31 DIAGNOSIS — L89523 Pressure ulcer of left ankle, stage 3: Secondary | ICD-10-CM | POA: Diagnosis not present

## 2017-04-04 DIAGNOSIS — G82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, complete: Secondary | ICD-10-CM | POA: Diagnosis not present

## 2017-04-04 DIAGNOSIS — L89523 Pressure ulcer of left ankle, stage 3: Secondary | ICD-10-CM | POA: Diagnosis not present

## 2017-04-04 DIAGNOSIS — L97511 Non-pressure chronic ulcer of other part of right 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: Secondary | ICD-10-CM | POA: Diagnosis not present

## 2017-04-04 DIAGNOSIS — L03115 Cellulitis of right lower limb: Secondary | ICD-10-CM | POA: Diagnosis not present

## 2017-04-04 DIAGNOSIS — I87332 Chronic venous hypertension (idiopathic) with ulcer and inflammation of left lower extremity: Secondary | ICD-10-CM | POA: Diagnosis not present

## 2017-04-04 DIAGNOSIS — L97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: Secondary | ICD-10-CM | POA: Diagnosis not present

## 2017-04-04 DIAGNOSIS — I10 Essential (primary) hypertension: Secondary | ICD-10-CM | POA: Diagnosis not present

## 2017-04-04 DIAGNOSIS — G4730 Sleep apnea, unspecified: Secondary | ICD-10-CM | POA: Diagnosis not present

## 2017-04-05 ENCOUNTER — Telehealth: Payer: Self-pay | Admitting: Neurology

## 2017-04-05 DIAGNOSIS — G4733 Obstructive sleep apnea (adult) (pediatric): Secondary | ICD-10-CM

## 2017-04-05 DIAGNOSIS — G8250 Quadriplegia, unspecified: Secondary | ICD-10-CM

## 2017-04-05 DIAGNOSIS — IMO0002 Reserved for concepts with insufficient information to code with codable children: Secondary | ICD-10-CM

## 2017-04-05 NOTE — Telephone Encounter (Signed)
[employer] denied CPAP suggested auto pap

## 2017-04-07 DIAGNOSIS — L03115 Cellulitis of right lower limb: Secondary | ICD-10-CM | POA: Diagnosis not present

## 2017-04-07 DIAGNOSIS — L97222 Non-pressure chronic ulcer of left calf with fat layer exposed: Secondary | ICD-10-CM | POA: Diagnosis not present

## 2017-04-07 DIAGNOSIS — I10 Essential (primary) hypertension: Secondary | ICD-10-CM | POA: Diagnosis not present

## 2017-04-07 DIAGNOSIS — I87332 Chronic venous hypertension (idiopathic) with ulcer and inflammation of left lower extremity: Secondary | ICD-10-CM | POA: Diagnosis not present

## 2017-04-07 DIAGNOSIS — L97511 Non-pressure chronic ulcer of other part of right 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: Secondary | ICD-10-CM | POA: Diagnosis not present

## 2017-04-07 DIAGNOSIS — L97512 Non-pressure chronic ulcer of other part of right foot with fat layer exposed: Secondary | ICD-10-CM | POA: Diagnosis not present

## 2017-04-07 DIAGNOSIS — L89523 Pressure ulcer of left ankle, stage 3: Secondary | ICD-10-CM | POA: Diagnosis not present

## 2017-04-07 DIAGNOSIS — G4730 Sleep apnea, unspecified: Secondary | ICD-10-CM | POA: Diagnosis not present

## 2017-04-07 DIAGNOSIS — L97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: Secondary | ICD-10-CM | POA: Diagnosis not present

## 2017-04-07 DIAGNOSIS — G82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, complete: Secondary | ICD-10-CM | POA: Diagnosis not present

## 2017-04-07 NOTE — Telephone Encounter (Signed)
Pt returned RN's call °

## 2017-04-07 NOTE — Telephone Encounter (Signed)
I called pt. I advised him that [place] denied the cpap titration study and therefore, Dr. Brett Fairy ordered an auto pap. I reviewed PAP compliance expectations with the pt. Pt is agreeable to starting a CPAP. I advised pt that an order will be sent to a DME, [facility], and [phone] will call the pt within about one week after they file with the pt's insurance. [facility] will show the pt how to use the machine, fit for masks, and troubleshoot the CPAP if needed. A follow up appt was made for insurance purposes with Dr. Brett Fairy on Thursday, September 13th, 2018 at 1:30pm. Pt verbalized understanding to arrive 15 minutes early and bring their CPAP. A letter with all of this information in it will be mailed to the pt as a reminder. I verified with the pt that the address we have on file is correct. Pt verbalized understanding of results. Pt had no questions at this time but was encouraged to call back if questions arise.

## 2017-04-07 NOTE — Telephone Encounter (Signed)
I called pt to discuss cpap titration denial, starting an auto pap. No answer, left a message asking him to call me back.

## 2017-04-11 DIAGNOSIS — I10 Essential (primary) hypertension: Secondary | ICD-10-CM | POA: Diagnosis not present

## 2017-04-11 DIAGNOSIS — G82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, complete: Secondary | ICD-10-CM | POA: Diagnosis not present

## 2017-04-11 DIAGNOSIS — I87332 Chronic venous hypertension (idiopathic) with ulcer and inflammation of left lower extremity: Secondary | ICD-10-CM | POA: Diagnosis not present

## 2017-04-11 DIAGNOSIS — L97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: Secondary | ICD-10-CM | POA: Diagnosis not present

## 2017-04-11 DIAGNOSIS — L89523 Pressure ulcer of left ankle, stage 3: Secondary | ICD-10-CM | POA: Diagnosis not present

## 2017-04-11 DIAGNOSIS — L97511 Non-pressure chronic ulcer of other part of right 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: Secondary | ICD-10-CM | POA: Diagnosis not present

## 2017-04-11 DIAGNOSIS — G4730 Sleep apnea, unspecified: Secondary | ICD-10-CM | POA: Diagnosis not present

## 2017-04-11 DIAGNOSIS — L03115 Cellulitis of right lower limb: Secondary | ICD-10-CM | POA: Diagnosis not present

## 2017-04-14 DIAGNOSIS — G4730 Sleep apnea, unspecified: Secondary | ICD-10-CM | POA: Diagnosis not present

## 2017-04-14 DIAGNOSIS — I10 Essential (primary) hypertension: Secondary | ICD-10-CM | POA: Diagnosis not present

## 2017-04-14 DIAGNOSIS — L89523 Pressure ulcer of left ankle, stage 3: Secondary | ICD-10-CM | POA: Diagnosis not present

## 2017-04-14 DIAGNOSIS — G82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, complete: Secondary | ICD-10-CM | POA: Diagnosis not present

## 2017-04-14 DIAGNOSIS — L97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: Secondary | ICD-10-CM | POA: Diagnosis not present

## 2017-04-14 DIAGNOSIS — L97222 Non-pressure chronic ulcer of left calf with fat layer exposed: Secondary | ICD-10-CM | POA: Diagnosis not present

## 2017-04-14 DIAGNOSIS — L97511 Non-pressure chronic ulcer of other part of right 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: Secondary | ICD-10-CM | POA: Diagnosis not present

## 2017-04-14 DIAGNOSIS — I87332 Chronic venous hypertension (idiopathic) with ulcer and inflammation of left lower extremity: Secondary | ICD-10-CM | POA: Diagnosis not present

## 2017-04-14 DIAGNOSIS — L03115 Cellulitis of right lower limb: Secondary | ICD-10-CM | POA: Diagnosis not present

## 2017-04-14 DIAGNOSIS — L97512 Non-pressure chronic ulcer of other part of right foot with fat layer exposed: Secondary | ICD-10-CM | POA: Diagnosis not present

## 2017-04-21 DIAGNOSIS — G4730 Sleep apnea, unspecified: Secondary | ICD-10-CM | POA: Diagnosis not present

## 2017-04-21 DIAGNOSIS — L89523 Pressure ulcer of left ankle, stage 3: Secondary | ICD-10-CM | POA: Diagnosis not present

## 2017-04-21 DIAGNOSIS — L97511 Non-pressure chronic ulcer of other part of right 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: Secondary | ICD-10-CM | POA: Diagnosis not present

## 2017-04-21 DIAGNOSIS — L97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: Secondary | ICD-10-CM | POA: Diagnosis not present

## 2017-04-21 DIAGNOSIS — L97222 Non-pressure chronic ulcer of left calf with fat layer exposed: Secondary | ICD-10-CM | POA: Diagnosis not present

## 2017-04-21 DIAGNOSIS — I87312 Chronic venous hypertension (idiopathic) with ulcer of left lower extremity: Secondary | ICD-10-CM | POA: Diagnosis not present

## 2017-04-21 DIAGNOSIS — I87332 Chronic venous hypertension (idiopathic) with ulcer and inflammation of left lower extremity: Secondary | ICD-10-CM | POA: Diagnosis not present

## 2017-04-21 DIAGNOSIS — I10 Essential (primary) hypertension: Secondary | ICD-10-CM | POA: Diagnosis not present

## 2017-04-21 DIAGNOSIS — L97512 Non-pressure chronic ulcer of other part of right foot with fat layer exposed: Secondary | ICD-10-CM | POA: Diagnosis not present

## 2017-04-21 DIAGNOSIS — G82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, complete: Secondary | ICD-10-CM | POA: Diagnosis not present

## 2017-04-21 DIAGNOSIS — G4733 Obstructive sleep apnea (adult) (pediatric): Secondary | ICD-10-CM | POA: Diagnosis not present

## 2017-04-21 DIAGNOSIS — L03115 Cellulitis of right lower limb: Secondary | ICD-10-CM | POA: Diagnosis not present

## 2017-04-25 ENCOUNTER — Encounter (HOSPITAL_BASED_OUTPATIENT_CLINIC_OR_DEPARTMENT_OTHER): Payer: BLUE CROSS/BLUE SHIELD | Attending: Internal Medicine

## 2017-04-25 DIAGNOSIS — I10 Essential (primary) hypertension: Secondary | ICD-10-CM | POA: Diagnosis not present

## 2017-04-25 DIAGNOSIS — G4730 Sleep apnea, unspecified: Secondary | ICD-10-CM | POA: Insufficient documentation

## 2017-04-25 DIAGNOSIS — L97519 Non-pressure chronic ulcer of other part of right foot with unspecified severity: Secondary | ICD-10-CM | POA: Diagnosis not present

## 2017-04-25 DIAGNOSIS — I87332 Chronic venous hypertension (idiopathic) with ulcer and inflammation of left lower extremity: Secondary | ICD-10-CM | POA: Diagnosis not present

## 2017-04-25 DIAGNOSIS — L97529 Non-pressure chronic ulcer of other part of left foot with unspecified severity: Secondary | ICD-10-CM | POA: Insufficient documentation

## 2017-04-25 DIAGNOSIS — L97822 Non-pressure chronic ulcer of other part of left lower leg with fat layer exposed: Secondary | ICD-10-CM | POA: Insufficient documentation

## 2017-04-25 DIAGNOSIS — G82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, complete: Secondary | ICD-10-CM | POA: Diagnosis not present

## 2017-04-25 DIAGNOSIS — G4733 Obstructive sleep apnea (adult) (pediatric): Secondary | ICD-10-CM | POA: Diagnosis not present

## 2017-04-25 DIAGNOSIS — L03115 Cellulitis of right lower limb: Secondary | ICD-10-CM | POA: Insufficient documentation

## 2017-04-28 ENCOUNTER — Encounter (HOSPITAL_BASED_OUTPATIENT_CLINIC_OR_DEPARTMENT_OTHER): Payer: BLUE CROSS/BLUE SHIELD

## 2017-04-28 DIAGNOSIS — L97512 Non-pressure chronic ulcer of other part of right foot with fat layer exposed: Secondary | ICD-10-CM | POA: Diagnosis not present

## 2017-04-28 DIAGNOSIS — G82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, complete: Secondary | ICD-10-CM | POA: Diagnosis not present

## 2017-04-28 DIAGNOSIS — I10 Essential (primary) hypertension: Secondary | ICD-10-CM | POA: Diagnosis not present

## 2017-04-28 DIAGNOSIS — L97519 Non-pressure chronic ulcer of other part of right foot with unspecified severity: Secondary | ICD-10-CM | POA: Diagnosis not present

## 2017-04-28 DIAGNOSIS — I87332 Chronic venous hypertension (idiopathic) with ulcer and inflammation of left lower extremity: Secondary | ICD-10-CM | POA: Diagnosis not present

## 2017-04-28 DIAGNOSIS — G4730 Sleep apnea, unspecified: Secondary | ICD-10-CM | POA: Diagnosis not present

## 2017-04-28 DIAGNOSIS — L03115 Cellulitis of right lower limb: Secondary | ICD-10-CM | POA: Diagnosis not present

## 2017-04-28 DIAGNOSIS — I87312 Chronic venous hypertension (idiopathic) with ulcer of left lower extremity: Secondary | ICD-10-CM | POA: Diagnosis not present

## 2017-04-28 DIAGNOSIS — L97822 Non-pressure chronic ulcer of other part of left lower leg with fat layer exposed: Secondary | ICD-10-CM | POA: Diagnosis not present

## 2017-04-28 DIAGNOSIS — L97529 Non-pressure chronic ulcer of other part of left foot with unspecified severity: Secondary | ICD-10-CM | POA: Diagnosis not present

## 2017-04-28 DIAGNOSIS — L97222 Non-pressure chronic ulcer of left calf with fat layer exposed: Secondary | ICD-10-CM | POA: Diagnosis not present

## 2017-05-02 DIAGNOSIS — L97519 Non-pressure chronic ulcer of other part of right foot with unspecified severity: Secondary | ICD-10-CM | POA: Diagnosis not present

## 2017-05-02 DIAGNOSIS — G82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, complete: Secondary | ICD-10-CM | POA: Diagnosis not present

## 2017-05-02 DIAGNOSIS — L97529 Non-pressure chronic ulcer of other part of left foot with unspecified severity: Secondary | ICD-10-CM | POA: Diagnosis not present

## 2017-05-02 DIAGNOSIS — I87332 Chronic venous hypertension (idiopathic) with ulcer and inflammation of left lower extremity: Secondary | ICD-10-CM | POA: Diagnosis not present

## 2017-05-02 DIAGNOSIS — L03115 Cellulitis of right lower limb: Secondary | ICD-10-CM | POA: Diagnosis not present

## 2017-05-02 DIAGNOSIS — G4730 Sleep apnea, unspecified: Secondary | ICD-10-CM | POA: Diagnosis not present

## 2017-05-02 DIAGNOSIS — I10 Essential (primary) hypertension: Secondary | ICD-10-CM | POA: Diagnosis not present

## 2017-05-02 DIAGNOSIS — L97822 Non-pressure chronic ulcer of other part of left lower leg with fat layer exposed: Secondary | ICD-10-CM | POA: Diagnosis not present

## 2017-05-05 DIAGNOSIS — L97529 Non-pressure chronic ulcer of other part of left foot with unspecified severity: Secondary | ICD-10-CM | POA: Diagnosis not present

## 2017-05-05 DIAGNOSIS — I87332 Chronic venous hypertension (idiopathic) with ulcer and inflammation of left lower extremity: Secondary | ICD-10-CM | POA: Diagnosis not present

## 2017-05-05 DIAGNOSIS — L97519 Non-pressure chronic ulcer of other part of right foot with unspecified severity: Secondary | ICD-10-CM | POA: Diagnosis not present

## 2017-05-05 DIAGNOSIS — I87312 Chronic venous hypertension (idiopathic) with ulcer of left lower extremity: Secondary | ICD-10-CM | POA: Diagnosis not present

## 2017-05-05 DIAGNOSIS — I10 Essential (primary) hypertension: Secondary | ICD-10-CM | POA: Diagnosis not present

## 2017-05-05 DIAGNOSIS — G4730 Sleep apnea, unspecified: Secondary | ICD-10-CM | POA: Diagnosis not present

## 2017-05-05 DIAGNOSIS — L97522 Non-pressure chronic ulcer of other part of left foot with fat layer exposed: Secondary | ICD-10-CM | POA: Diagnosis not present

## 2017-05-05 DIAGNOSIS — L97222 Non-pressure chronic ulcer of left calf with fat layer exposed: Secondary | ICD-10-CM | POA: Diagnosis not present

## 2017-05-05 DIAGNOSIS — L03115 Cellulitis of right lower limb: Secondary | ICD-10-CM | POA: Diagnosis not present

## 2017-05-05 DIAGNOSIS — G82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, complete: Secondary | ICD-10-CM | POA: Diagnosis not present

## 2017-05-05 DIAGNOSIS — L97822 Non-pressure chronic ulcer of other part of left lower leg with fat layer exposed: Secondary | ICD-10-CM | POA: Diagnosis not present

## 2017-05-05 DIAGNOSIS — L97512 Non-pressure chronic ulcer of other part of right foot with fat layer exposed: Secondary | ICD-10-CM | POA: Diagnosis not present

## 2017-05-09 DIAGNOSIS — L97822 Non-pressure chronic ulcer of other part of left lower leg with fat layer exposed: Secondary | ICD-10-CM | POA: Diagnosis not present

## 2017-05-09 DIAGNOSIS — I87332 Chronic venous hypertension (idiopathic) with ulcer and inflammation of left lower extremity: Secondary | ICD-10-CM | POA: Diagnosis not present

## 2017-05-09 DIAGNOSIS — L97519 Non-pressure chronic ulcer of other part of right foot with unspecified severity: Secondary | ICD-10-CM | POA: Diagnosis not present

## 2017-05-09 DIAGNOSIS — L03115 Cellulitis of right lower limb: Secondary | ICD-10-CM | POA: Diagnosis not present

## 2017-05-09 DIAGNOSIS — G82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, complete: Secondary | ICD-10-CM | POA: Diagnosis not present

## 2017-05-09 DIAGNOSIS — G4730 Sleep apnea, unspecified: Secondary | ICD-10-CM | POA: Diagnosis not present

## 2017-05-09 DIAGNOSIS — L97529 Non-pressure chronic ulcer of other part of left foot with unspecified severity: Secondary | ICD-10-CM | POA: Diagnosis not present

## 2017-05-09 DIAGNOSIS — F331 Major depressive disorder, recurrent, moderate: Secondary | ICD-10-CM | POA: Diagnosis not present

## 2017-05-09 DIAGNOSIS — I10 Essential (primary) hypertension: Secondary | ICD-10-CM | POA: Diagnosis not present

## 2017-05-12 DIAGNOSIS — S91302A Unspecified open wound, left foot, initial encounter: Secondary | ICD-10-CM | POA: Diagnosis not present

## 2017-05-12 DIAGNOSIS — L03115 Cellulitis of right lower limb: Secondary | ICD-10-CM | POA: Diagnosis not present

## 2017-05-12 DIAGNOSIS — L97822 Non-pressure chronic ulcer of other part of left lower leg with fat layer exposed: Secondary | ICD-10-CM | POA: Diagnosis not present

## 2017-05-12 DIAGNOSIS — G4730 Sleep apnea, unspecified: Secondary | ICD-10-CM | POA: Diagnosis not present

## 2017-05-12 DIAGNOSIS — L97529 Non-pressure chronic ulcer of other part of left foot with unspecified severity: Secondary | ICD-10-CM | POA: Diagnosis not present

## 2017-05-12 DIAGNOSIS — S91104A Unspecified open wound of right lesser toe(s) without damage to nail, initial encounter: Secondary | ICD-10-CM | POA: Diagnosis not present

## 2017-05-12 DIAGNOSIS — I87332 Chronic venous hypertension (idiopathic) with ulcer and inflammation of left lower extremity: Secondary | ICD-10-CM | POA: Diagnosis not present

## 2017-05-12 DIAGNOSIS — S91301A Unspecified open wound, right foot, initial encounter: Secondary | ICD-10-CM | POA: Diagnosis not present

## 2017-05-12 DIAGNOSIS — L97222 Non-pressure chronic ulcer of left calf with fat layer exposed: Secondary | ICD-10-CM | POA: Diagnosis not present

## 2017-05-12 DIAGNOSIS — I10 Essential (primary) hypertension: Secondary | ICD-10-CM | POA: Diagnosis not present

## 2017-05-12 DIAGNOSIS — G82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, complete: Secondary | ICD-10-CM | POA: Diagnosis not present

## 2017-05-12 DIAGNOSIS — I87312 Chronic venous hypertension (idiopathic) with ulcer of left lower extremity: Secondary | ICD-10-CM | POA: Diagnosis not present

## 2017-05-12 DIAGNOSIS — L97519 Non-pressure chronic ulcer of other part of right foot with unspecified severity: Secondary | ICD-10-CM | POA: Diagnosis not present

## 2017-05-16 DIAGNOSIS — G82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, complete: Secondary | ICD-10-CM | POA: Diagnosis not present

## 2017-05-16 DIAGNOSIS — I87332 Chronic venous hypertension (idiopathic) with ulcer and inflammation of left lower extremity: Secondary | ICD-10-CM | POA: Diagnosis not present

## 2017-05-16 DIAGNOSIS — L97519 Non-pressure chronic ulcer of other part of right foot with unspecified severity: Secondary | ICD-10-CM | POA: Diagnosis not present

## 2017-05-16 DIAGNOSIS — L03115 Cellulitis of right lower limb: Secondary | ICD-10-CM | POA: Diagnosis not present

## 2017-05-16 DIAGNOSIS — I10 Essential (primary) hypertension: Secondary | ICD-10-CM | POA: Diagnosis not present

## 2017-05-16 DIAGNOSIS — L97822 Non-pressure chronic ulcer of other part of left lower leg with fat layer exposed: Secondary | ICD-10-CM | POA: Diagnosis not present

## 2017-05-16 DIAGNOSIS — L97529 Non-pressure chronic ulcer of other part of left foot with unspecified severity: Secondary | ICD-10-CM | POA: Diagnosis not present

## 2017-05-16 DIAGNOSIS — G4730 Sleep apnea, unspecified: Secondary | ICD-10-CM | POA: Diagnosis not present

## 2017-05-19 DIAGNOSIS — L97822 Non-pressure chronic ulcer of other part of left lower leg with fat layer exposed: Secondary | ICD-10-CM | POA: Diagnosis not present

## 2017-05-19 DIAGNOSIS — L97812 Non-pressure chronic ulcer of other part of right lower leg with fat layer exposed: Secondary | ICD-10-CM | POA: Diagnosis not present

## 2017-05-19 DIAGNOSIS — G4730 Sleep apnea, unspecified: Secondary | ICD-10-CM | POA: Diagnosis not present

## 2017-05-19 DIAGNOSIS — L97529 Non-pressure chronic ulcer of other part of left foot with unspecified severity: Secondary | ICD-10-CM | POA: Diagnosis not present

## 2017-05-19 DIAGNOSIS — L03115 Cellulitis of right lower limb: Secondary | ICD-10-CM | POA: Diagnosis not present

## 2017-05-19 DIAGNOSIS — S91104A Unspecified open wound of right lesser toe(s) without damage to nail, initial encounter: Secondary | ICD-10-CM | POA: Diagnosis not present

## 2017-05-19 DIAGNOSIS — I87312 Chronic venous hypertension (idiopathic) with ulcer of left lower extremity: Secondary | ICD-10-CM | POA: Diagnosis not present

## 2017-05-19 DIAGNOSIS — G82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, complete: Secondary | ICD-10-CM | POA: Diagnosis not present

## 2017-05-19 DIAGNOSIS — L97519 Non-pressure chronic ulcer of other part of right foot with unspecified severity: Secondary | ICD-10-CM | POA: Diagnosis not present

## 2017-05-19 DIAGNOSIS — S91301A Unspecified open wound, right foot, initial encounter: Secondary | ICD-10-CM | POA: Diagnosis not present

## 2017-05-19 DIAGNOSIS — I10 Essential (primary) hypertension: Secondary | ICD-10-CM | POA: Diagnosis not present

## 2017-05-19 DIAGNOSIS — I87332 Chronic venous hypertension (idiopathic) with ulcer and inflammation of left lower extremity: Secondary | ICD-10-CM | POA: Diagnosis not present

## 2017-05-21 DIAGNOSIS — G4733 Obstructive sleep apnea (adult) (pediatric): Secondary | ICD-10-CM | POA: Diagnosis not present

## 2017-05-23 ENCOUNTER — Encounter (HOSPITAL_BASED_OUTPATIENT_CLINIC_OR_DEPARTMENT_OTHER): Payer: BLUE CROSS/BLUE SHIELD | Attending: Internal Medicine

## 2017-05-23 DIAGNOSIS — L97519 Non-pressure chronic ulcer of other part of right foot with unspecified severity: Secondary | ICD-10-CM | POA: Insufficient documentation

## 2017-05-23 DIAGNOSIS — L97522 Non-pressure chronic ulcer of other part of left foot with fat layer exposed: Secondary | ICD-10-CM | POA: Insufficient documentation

## 2017-05-23 DIAGNOSIS — I10 Essential (primary) hypertension: Secondary | ICD-10-CM | POA: Insufficient documentation

## 2017-05-23 DIAGNOSIS — I87332 Chronic venous hypertension (idiopathic) with ulcer and inflammation of left lower extremity: Secondary | ICD-10-CM | POA: Diagnosis not present

## 2017-05-23 DIAGNOSIS — G4730 Sleep apnea, unspecified: Secondary | ICD-10-CM | POA: Diagnosis not present

## 2017-05-23 DIAGNOSIS — B353 Tinea pedis: Secondary | ICD-10-CM | POA: Insufficient documentation

## 2017-05-23 DIAGNOSIS — G82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, complete: Secondary | ICD-10-CM | POA: Insufficient documentation

## 2017-05-26 DIAGNOSIS — L97522 Non-pressure chronic ulcer of other part of left foot with fat layer exposed: Secondary | ICD-10-CM | POA: Diagnosis not present

## 2017-05-26 DIAGNOSIS — I87332 Chronic venous hypertension (idiopathic) with ulcer and inflammation of left lower extremity: Secondary | ICD-10-CM | POA: Diagnosis not present

## 2017-05-26 DIAGNOSIS — G4730 Sleep apnea, unspecified: Secondary | ICD-10-CM | POA: Diagnosis not present

## 2017-05-26 DIAGNOSIS — L97519 Non-pressure chronic ulcer of other part of right foot with unspecified severity: Secondary | ICD-10-CM | POA: Diagnosis not present

## 2017-05-26 DIAGNOSIS — I10 Essential (primary) hypertension: Secondary | ICD-10-CM | POA: Diagnosis not present

## 2017-05-26 DIAGNOSIS — I87312 Chronic venous hypertension (idiopathic) with ulcer of left lower extremity: Secondary | ICD-10-CM | POA: Diagnosis not present

## 2017-05-26 DIAGNOSIS — S91301A Unspecified open wound, right foot, initial encounter: Secondary | ICD-10-CM | POA: Diagnosis not present

## 2017-05-26 DIAGNOSIS — B353 Tinea pedis: Secondary | ICD-10-CM | POA: Diagnosis not present

## 2017-05-26 DIAGNOSIS — G82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, complete: Secondary | ICD-10-CM | POA: Diagnosis not present

## 2017-05-26 DIAGNOSIS — S91104A Unspecified open wound of right lesser toe(s) without damage to nail, initial encounter: Secondary | ICD-10-CM | POA: Diagnosis not present

## 2017-05-26 DIAGNOSIS — L97222 Non-pressure chronic ulcer of left calf with fat layer exposed: Secondary | ICD-10-CM | POA: Diagnosis not present

## 2017-05-30 DIAGNOSIS — L97522 Non-pressure chronic ulcer of other part of left foot with fat layer exposed: Secondary | ICD-10-CM | POA: Diagnosis not present

## 2017-05-30 DIAGNOSIS — G4730 Sleep apnea, unspecified: Secondary | ICD-10-CM | POA: Diagnosis not present

## 2017-05-30 DIAGNOSIS — B353 Tinea pedis: Secondary | ICD-10-CM | POA: Diagnosis not present

## 2017-05-30 DIAGNOSIS — I87332 Chronic venous hypertension (idiopathic) with ulcer and inflammation of left lower extremity: Secondary | ICD-10-CM | POA: Diagnosis not present

## 2017-05-30 DIAGNOSIS — L97519 Non-pressure chronic ulcer of other part of right foot with unspecified severity: Secondary | ICD-10-CM | POA: Diagnosis not present

## 2017-05-30 DIAGNOSIS — I10 Essential (primary) hypertension: Secondary | ICD-10-CM | POA: Diagnosis not present

## 2017-05-30 DIAGNOSIS — G82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, complete: Secondary | ICD-10-CM | POA: Diagnosis not present

## 2017-06-02 DIAGNOSIS — L97512 Non-pressure chronic ulcer of other part of right foot with fat layer exposed: Secondary | ICD-10-CM | POA: Diagnosis not present

## 2017-06-02 DIAGNOSIS — I87332 Chronic venous hypertension (idiopathic) with ulcer and inflammation of left lower extremity: Secondary | ICD-10-CM | POA: Diagnosis not present

## 2017-06-02 DIAGNOSIS — I10 Essential (primary) hypertension: Secondary | ICD-10-CM | POA: Diagnosis not present

## 2017-06-02 DIAGNOSIS — L97519 Non-pressure chronic ulcer of other part of right foot with unspecified severity: Secondary | ICD-10-CM | POA: Diagnosis not present

## 2017-06-02 DIAGNOSIS — G82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, complete: Secondary | ICD-10-CM | POA: Diagnosis not present

## 2017-06-02 DIAGNOSIS — L97222 Non-pressure chronic ulcer of left calf with fat layer exposed: Secondary | ICD-10-CM | POA: Diagnosis not present

## 2017-06-02 DIAGNOSIS — G4730 Sleep apnea, unspecified: Secondary | ICD-10-CM | POA: Diagnosis not present

## 2017-06-02 DIAGNOSIS — L97522 Non-pressure chronic ulcer of other part of left foot with fat layer exposed: Secondary | ICD-10-CM | POA: Diagnosis not present

## 2017-06-02 DIAGNOSIS — B353 Tinea pedis: Secondary | ICD-10-CM | POA: Diagnosis not present

## 2017-06-02 DIAGNOSIS — I87312 Chronic venous hypertension (idiopathic) with ulcer of left lower extremity: Secondary | ICD-10-CM | POA: Diagnosis not present

## 2017-06-06 DIAGNOSIS — I10 Essential (primary) hypertension: Secondary | ICD-10-CM | POA: Diagnosis not present

## 2017-06-06 DIAGNOSIS — I87332 Chronic venous hypertension (idiopathic) with ulcer and inflammation of left lower extremity: Secondary | ICD-10-CM | POA: Diagnosis not present

## 2017-06-06 DIAGNOSIS — L97522 Non-pressure chronic ulcer of other part of left foot with fat layer exposed: Secondary | ICD-10-CM | POA: Diagnosis not present

## 2017-06-06 DIAGNOSIS — L97519 Non-pressure chronic ulcer of other part of right foot with unspecified severity: Secondary | ICD-10-CM | POA: Diagnosis not present

## 2017-06-06 DIAGNOSIS — B353 Tinea pedis: Secondary | ICD-10-CM | POA: Diagnosis not present

## 2017-06-06 DIAGNOSIS — G4730 Sleep apnea, unspecified: Secondary | ICD-10-CM | POA: Diagnosis not present

## 2017-06-06 DIAGNOSIS — G82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, complete: Secondary | ICD-10-CM | POA: Diagnosis not present

## 2017-06-09 DIAGNOSIS — B353 Tinea pedis: Secondary | ICD-10-CM | POA: Diagnosis not present

## 2017-06-09 DIAGNOSIS — I87312 Chronic venous hypertension (idiopathic) with ulcer of left lower extremity: Secondary | ICD-10-CM | POA: Diagnosis not present

## 2017-06-09 DIAGNOSIS — L97522 Non-pressure chronic ulcer of other part of left foot with fat layer exposed: Secondary | ICD-10-CM | POA: Diagnosis not present

## 2017-06-09 DIAGNOSIS — G82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, complete: Secondary | ICD-10-CM | POA: Diagnosis not present

## 2017-06-09 DIAGNOSIS — L97822 Non-pressure chronic ulcer of other part of left lower leg with fat layer exposed: Secondary | ICD-10-CM | POA: Diagnosis not present

## 2017-06-09 DIAGNOSIS — L97512 Non-pressure chronic ulcer of other part of right foot with fat layer exposed: Secondary | ICD-10-CM | POA: Diagnosis not present

## 2017-06-09 DIAGNOSIS — G4730 Sleep apnea, unspecified: Secondary | ICD-10-CM | POA: Diagnosis not present

## 2017-06-09 DIAGNOSIS — I87332 Chronic venous hypertension (idiopathic) with ulcer and inflammation of left lower extremity: Secondary | ICD-10-CM | POA: Diagnosis not present

## 2017-06-09 DIAGNOSIS — L97519 Non-pressure chronic ulcer of other part of right foot with unspecified severity: Secondary | ICD-10-CM | POA: Diagnosis not present

## 2017-06-09 DIAGNOSIS — I10 Essential (primary) hypertension: Secondary | ICD-10-CM | POA: Diagnosis not present

## 2017-06-13 DIAGNOSIS — I87332 Chronic venous hypertension (idiopathic) with ulcer and inflammation of left lower extremity: Secondary | ICD-10-CM | POA: Diagnosis not present

## 2017-06-13 DIAGNOSIS — I10 Essential (primary) hypertension: Secondary | ICD-10-CM | POA: Diagnosis not present

## 2017-06-13 DIAGNOSIS — L97522 Non-pressure chronic ulcer of other part of left foot with fat layer exposed: Secondary | ICD-10-CM | POA: Diagnosis not present

## 2017-06-13 DIAGNOSIS — G4730 Sleep apnea, unspecified: Secondary | ICD-10-CM | POA: Diagnosis not present

## 2017-06-13 DIAGNOSIS — L97519 Non-pressure chronic ulcer of other part of right foot with unspecified severity: Secondary | ICD-10-CM | POA: Diagnosis not present

## 2017-06-13 DIAGNOSIS — B353 Tinea pedis: Secondary | ICD-10-CM | POA: Diagnosis not present

## 2017-06-13 DIAGNOSIS — G82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, complete: Secondary | ICD-10-CM | POA: Diagnosis not present

## 2017-06-16 DIAGNOSIS — L97222 Non-pressure chronic ulcer of left calf with fat layer exposed: Secondary | ICD-10-CM | POA: Diagnosis not present

## 2017-06-16 DIAGNOSIS — L97522 Non-pressure chronic ulcer of other part of left foot with fat layer exposed: Secondary | ICD-10-CM | POA: Diagnosis not present

## 2017-06-16 DIAGNOSIS — L97519 Non-pressure chronic ulcer of other part of right foot with unspecified severity: Secondary | ICD-10-CM | POA: Diagnosis not present

## 2017-06-16 DIAGNOSIS — I87312 Chronic venous hypertension (idiopathic) with ulcer of left lower extremity: Secondary | ICD-10-CM | POA: Diagnosis not present

## 2017-06-16 DIAGNOSIS — I10 Essential (primary) hypertension: Secondary | ICD-10-CM | POA: Diagnosis not present

## 2017-06-16 DIAGNOSIS — I87332 Chronic venous hypertension (idiopathic) with ulcer and inflammation of left lower extremity: Secondary | ICD-10-CM | POA: Diagnosis not present

## 2017-06-16 DIAGNOSIS — B353 Tinea pedis: Secondary | ICD-10-CM | POA: Diagnosis not present

## 2017-06-16 DIAGNOSIS — G4730 Sleep apnea, unspecified: Secondary | ICD-10-CM | POA: Diagnosis not present

## 2017-06-16 DIAGNOSIS — G82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, complete: Secondary | ICD-10-CM | POA: Diagnosis not present

## 2017-06-20 DIAGNOSIS — I87332 Chronic venous hypertension (idiopathic) with ulcer and inflammation of left lower extremity: Secondary | ICD-10-CM | POA: Diagnosis not present

## 2017-06-20 DIAGNOSIS — L97519 Non-pressure chronic ulcer of other part of right foot with unspecified severity: Secondary | ICD-10-CM | POA: Diagnosis not present

## 2017-06-20 DIAGNOSIS — G4730 Sleep apnea, unspecified: Secondary | ICD-10-CM | POA: Diagnosis not present

## 2017-06-20 DIAGNOSIS — L97522 Non-pressure chronic ulcer of other part of left foot with fat layer exposed: Secondary | ICD-10-CM | POA: Diagnosis not present

## 2017-06-20 DIAGNOSIS — I10 Essential (primary) hypertension: Secondary | ICD-10-CM | POA: Diagnosis not present

## 2017-06-20 DIAGNOSIS — G82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, complete: Secondary | ICD-10-CM | POA: Diagnosis not present

## 2017-06-20 DIAGNOSIS — B353 Tinea pedis: Secondary | ICD-10-CM | POA: Diagnosis not present

## 2017-06-23 ENCOUNTER — Encounter (HOSPITAL_BASED_OUTPATIENT_CLINIC_OR_DEPARTMENT_OTHER): Payer: BLUE CROSS/BLUE SHIELD | Attending: Internal Medicine

## 2017-06-23 DIAGNOSIS — L97222 Non-pressure chronic ulcer of left calf with fat layer exposed: Secondary | ICD-10-CM | POA: Diagnosis not present

## 2017-06-23 DIAGNOSIS — I87332 Chronic venous hypertension (idiopathic) with ulcer and inflammation of left lower extremity: Secondary | ICD-10-CM | POA: Insufficient documentation

## 2017-06-23 DIAGNOSIS — L97822 Non-pressure chronic ulcer of other part of left lower leg with fat layer exposed: Secondary | ICD-10-CM | POA: Insufficient documentation

## 2017-06-23 DIAGNOSIS — I890 Lymphedema, not elsewhere classified: Secondary | ICD-10-CM | POA: Diagnosis not present

## 2017-06-23 DIAGNOSIS — L03115 Cellulitis of right lower limb: Secondary | ICD-10-CM | POA: Diagnosis not present

## 2017-06-23 DIAGNOSIS — L97522 Non-pressure chronic ulcer of other part of left foot with fat layer exposed: Secondary | ICD-10-CM | POA: Diagnosis not present

## 2017-06-23 DIAGNOSIS — I10 Essential (primary) hypertension: Secondary | ICD-10-CM | POA: Diagnosis not present

## 2017-06-23 DIAGNOSIS — G82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, complete: Secondary | ICD-10-CM | POA: Insufficient documentation

## 2017-06-23 DIAGNOSIS — L97512 Non-pressure chronic ulcer of other part of right foot with fat layer exposed: Secondary | ICD-10-CM | POA: Insufficient documentation

## 2017-06-23 DIAGNOSIS — G4730 Sleep apnea, unspecified: Secondary | ICD-10-CM | POA: Diagnosis not present

## 2017-06-23 DIAGNOSIS — I87312 Chronic venous hypertension (idiopathic) with ulcer of left lower extremity: Secondary | ICD-10-CM | POA: Diagnosis not present

## 2017-06-27 ENCOUNTER — Encounter (HOSPITAL_BASED_OUTPATIENT_CLINIC_OR_DEPARTMENT_OTHER): Payer: BLUE CROSS/BLUE SHIELD

## 2017-06-27 DIAGNOSIS — I10 Essential (primary) hypertension: Secondary | ICD-10-CM | POA: Diagnosis not present

## 2017-06-27 DIAGNOSIS — L97512 Non-pressure chronic ulcer of other part of right foot with fat layer exposed: Secondary | ICD-10-CM | POA: Diagnosis not present

## 2017-06-27 DIAGNOSIS — G82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, complete: Secondary | ICD-10-CM | POA: Diagnosis not present

## 2017-06-27 DIAGNOSIS — I87332 Chronic venous hypertension (idiopathic) with ulcer and inflammation of left lower extremity: Secondary | ICD-10-CM | POA: Diagnosis not present

## 2017-06-27 DIAGNOSIS — G4730 Sleep apnea, unspecified: Secondary | ICD-10-CM | POA: Diagnosis not present

## 2017-06-27 DIAGNOSIS — I890 Lymphedema, not elsewhere classified: Secondary | ICD-10-CM | POA: Diagnosis not present

## 2017-06-27 DIAGNOSIS — L97822 Non-pressure chronic ulcer of other part of left lower leg with fat layer exposed: Secondary | ICD-10-CM | POA: Diagnosis not present

## 2017-06-27 DIAGNOSIS — L97522 Non-pressure chronic ulcer of other part of left foot with fat layer exposed: Secondary | ICD-10-CM | POA: Diagnosis not present

## 2017-06-27 DIAGNOSIS — L03115 Cellulitis of right lower limb: Secondary | ICD-10-CM | POA: Diagnosis not present

## 2017-06-30 DIAGNOSIS — I87332 Chronic venous hypertension (idiopathic) with ulcer and inflammation of left lower extremity: Secondary | ICD-10-CM | POA: Diagnosis not present

## 2017-06-30 DIAGNOSIS — L03115 Cellulitis of right lower limb: Secondary | ICD-10-CM | POA: Diagnosis not present

## 2017-06-30 DIAGNOSIS — G4730 Sleep apnea, unspecified: Secondary | ICD-10-CM | POA: Diagnosis not present

## 2017-06-30 DIAGNOSIS — I890 Lymphedema, not elsewhere classified: Secondary | ICD-10-CM | POA: Diagnosis not present

## 2017-06-30 DIAGNOSIS — G82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, complete: Secondary | ICD-10-CM | POA: Diagnosis not present

## 2017-06-30 DIAGNOSIS — L97822 Non-pressure chronic ulcer of other part of left lower leg with fat layer exposed: Secondary | ICD-10-CM | POA: Diagnosis not present

## 2017-06-30 DIAGNOSIS — L97222 Non-pressure chronic ulcer of left calf with fat layer exposed: Secondary | ICD-10-CM | POA: Diagnosis not present

## 2017-06-30 DIAGNOSIS — I87312 Chronic venous hypertension (idiopathic) with ulcer of left lower extremity: Secondary | ICD-10-CM | POA: Diagnosis not present

## 2017-06-30 DIAGNOSIS — L97512 Non-pressure chronic ulcer of other part of right foot with fat layer exposed: Secondary | ICD-10-CM | POA: Diagnosis not present

## 2017-06-30 DIAGNOSIS — I10 Essential (primary) hypertension: Secondary | ICD-10-CM | POA: Diagnosis not present

## 2017-06-30 DIAGNOSIS — L97522 Non-pressure chronic ulcer of other part of left foot with fat layer exposed: Secondary | ICD-10-CM | POA: Diagnosis not present

## 2017-07-04 DIAGNOSIS — G82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, complete: Secondary | ICD-10-CM | POA: Diagnosis not present

## 2017-07-04 DIAGNOSIS — I890 Lymphedema, not elsewhere classified: Secondary | ICD-10-CM | POA: Diagnosis not present

## 2017-07-04 DIAGNOSIS — I10 Essential (primary) hypertension: Secondary | ICD-10-CM | POA: Diagnosis not present

## 2017-07-04 DIAGNOSIS — G4730 Sleep apnea, unspecified: Secondary | ICD-10-CM | POA: Diagnosis not present

## 2017-07-04 DIAGNOSIS — L97512 Non-pressure chronic ulcer of other part of right foot with fat layer exposed: Secondary | ICD-10-CM | POA: Diagnosis not present

## 2017-07-04 DIAGNOSIS — I87332 Chronic venous hypertension (idiopathic) with ulcer and inflammation of left lower extremity: Secondary | ICD-10-CM | POA: Diagnosis not present

## 2017-07-04 DIAGNOSIS — L03115 Cellulitis of right lower limb: Secondary | ICD-10-CM | POA: Diagnosis not present

## 2017-07-04 DIAGNOSIS — L97522 Non-pressure chronic ulcer of other part of left foot with fat layer exposed: Secondary | ICD-10-CM | POA: Diagnosis not present

## 2017-07-04 DIAGNOSIS — L97822 Non-pressure chronic ulcer of other part of left lower leg with fat layer exposed: Secondary | ICD-10-CM | POA: Diagnosis not present

## 2017-07-07 DIAGNOSIS — I87312 Chronic venous hypertension (idiopathic) with ulcer of left lower extremity: Secondary | ICD-10-CM | POA: Diagnosis not present

## 2017-07-07 DIAGNOSIS — I890 Lymphedema, not elsewhere classified: Secondary | ICD-10-CM | POA: Diagnosis not present

## 2017-07-07 DIAGNOSIS — I10 Essential (primary) hypertension: Secondary | ICD-10-CM | POA: Diagnosis not present

## 2017-07-07 DIAGNOSIS — G82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, complete: Secondary | ICD-10-CM | POA: Diagnosis not present

## 2017-07-07 DIAGNOSIS — I87332 Chronic venous hypertension (idiopathic) with ulcer and inflammation of left lower extremity: Secondary | ICD-10-CM | POA: Diagnosis not present

## 2017-07-07 DIAGNOSIS — G4730 Sleep apnea, unspecified: Secondary | ICD-10-CM | POA: Diagnosis not present

## 2017-07-07 DIAGNOSIS — L97822 Non-pressure chronic ulcer of other part of left lower leg with fat layer exposed: Secondary | ICD-10-CM | POA: Diagnosis not present

## 2017-07-07 DIAGNOSIS — L97512 Non-pressure chronic ulcer of other part of right foot with fat layer exposed: Secondary | ICD-10-CM | POA: Diagnosis not present

## 2017-07-07 DIAGNOSIS — L97522 Non-pressure chronic ulcer of other part of left foot with fat layer exposed: Secondary | ICD-10-CM | POA: Diagnosis not present

## 2017-07-07 DIAGNOSIS — L03115 Cellulitis of right lower limb: Secondary | ICD-10-CM | POA: Diagnosis not present

## 2017-07-07 DIAGNOSIS — L97222 Non-pressure chronic ulcer of left calf with fat layer exposed: Secondary | ICD-10-CM | POA: Diagnosis not present

## 2017-07-11 DIAGNOSIS — I87332 Chronic venous hypertension (idiopathic) with ulcer and inflammation of left lower extremity: Secondary | ICD-10-CM | POA: Diagnosis not present

## 2017-07-11 DIAGNOSIS — I10 Essential (primary) hypertension: Secondary | ICD-10-CM | POA: Diagnosis not present

## 2017-07-11 DIAGNOSIS — G4730 Sleep apnea, unspecified: Secondary | ICD-10-CM | POA: Diagnosis not present

## 2017-07-11 DIAGNOSIS — L97522 Non-pressure chronic ulcer of other part of left foot with fat layer exposed: Secondary | ICD-10-CM | POA: Diagnosis not present

## 2017-07-11 DIAGNOSIS — L97512 Non-pressure chronic ulcer of other part of right foot with fat layer exposed: Secondary | ICD-10-CM | POA: Diagnosis not present

## 2017-07-11 DIAGNOSIS — I890 Lymphedema, not elsewhere classified: Secondary | ICD-10-CM | POA: Diagnosis not present

## 2017-07-11 DIAGNOSIS — L03115 Cellulitis of right lower limb: Secondary | ICD-10-CM | POA: Diagnosis not present

## 2017-07-11 DIAGNOSIS — G82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, complete: Secondary | ICD-10-CM | POA: Diagnosis not present

## 2017-07-11 DIAGNOSIS — L97822 Non-pressure chronic ulcer of other part of left lower leg with fat layer exposed: Secondary | ICD-10-CM | POA: Diagnosis not present

## 2017-07-14 DIAGNOSIS — I87312 Chronic venous hypertension (idiopathic) with ulcer of left lower extremity: Secondary | ICD-10-CM | POA: Diagnosis not present

## 2017-07-14 DIAGNOSIS — L97522 Non-pressure chronic ulcer of other part of left foot with fat layer exposed: Secondary | ICD-10-CM | POA: Diagnosis not present

## 2017-07-14 DIAGNOSIS — G4730 Sleep apnea, unspecified: Secondary | ICD-10-CM | POA: Diagnosis not present

## 2017-07-14 DIAGNOSIS — G82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, complete: Secondary | ICD-10-CM | POA: Diagnosis not present

## 2017-07-14 DIAGNOSIS — L03116 Cellulitis of left lower limb: Secondary | ICD-10-CM | POA: Diagnosis not present

## 2017-07-14 DIAGNOSIS — I87332 Chronic venous hypertension (idiopathic) with ulcer and inflammation of left lower extremity: Secondary | ICD-10-CM | POA: Diagnosis not present

## 2017-07-14 DIAGNOSIS — L97512 Non-pressure chronic ulcer of other part of right foot with fat layer exposed: Secondary | ICD-10-CM | POA: Diagnosis not present

## 2017-07-14 DIAGNOSIS — L97822 Non-pressure chronic ulcer of other part of left lower leg with fat layer exposed: Secondary | ICD-10-CM | POA: Diagnosis not present

## 2017-07-14 DIAGNOSIS — L03032 Cellulitis of left toe: Secondary | ICD-10-CM | POA: Diagnosis not present

## 2017-07-14 DIAGNOSIS — L97222 Non-pressure chronic ulcer of left calf with fat layer exposed: Secondary | ICD-10-CM | POA: Diagnosis not present

## 2017-07-14 DIAGNOSIS — L03115 Cellulitis of right lower limb: Secondary | ICD-10-CM | POA: Diagnosis not present

## 2017-07-14 DIAGNOSIS — I890 Lymphedema, not elsewhere classified: Secondary | ICD-10-CM | POA: Diagnosis not present

## 2017-07-14 DIAGNOSIS — I10 Essential (primary) hypertension: Secondary | ICD-10-CM | POA: Diagnosis not present

## 2017-07-14 DIAGNOSIS — L03031 Cellulitis of right toe: Secondary | ICD-10-CM | POA: Diagnosis not present

## 2017-07-18 DIAGNOSIS — L97522 Non-pressure chronic ulcer of other part of left foot with fat layer exposed: Secondary | ICD-10-CM | POA: Diagnosis not present

## 2017-07-18 DIAGNOSIS — L03115 Cellulitis of right lower limb: Secondary | ICD-10-CM | POA: Diagnosis not present

## 2017-07-18 DIAGNOSIS — I890 Lymphedema, not elsewhere classified: Secondary | ICD-10-CM | POA: Diagnosis not present

## 2017-07-18 DIAGNOSIS — G82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, complete: Secondary | ICD-10-CM | POA: Diagnosis not present

## 2017-07-18 DIAGNOSIS — L97512 Non-pressure chronic ulcer of other part of right foot with fat layer exposed: Secondary | ICD-10-CM | POA: Diagnosis not present

## 2017-07-18 DIAGNOSIS — G4730 Sleep apnea, unspecified: Secondary | ICD-10-CM | POA: Diagnosis not present

## 2017-07-18 DIAGNOSIS — I10 Essential (primary) hypertension: Secondary | ICD-10-CM | POA: Diagnosis not present

## 2017-07-18 DIAGNOSIS — I87332 Chronic venous hypertension (idiopathic) with ulcer and inflammation of left lower extremity: Secondary | ICD-10-CM | POA: Diagnosis not present

## 2017-07-18 DIAGNOSIS — L97822 Non-pressure chronic ulcer of other part of left lower leg with fat layer exposed: Secondary | ICD-10-CM | POA: Diagnosis not present

## 2017-07-21 DIAGNOSIS — L03115 Cellulitis of right lower limb: Secondary | ICD-10-CM | POA: Diagnosis not present

## 2017-07-21 DIAGNOSIS — G82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, complete: Secondary | ICD-10-CM | POA: Diagnosis not present

## 2017-07-21 DIAGNOSIS — I10 Essential (primary) hypertension: Secondary | ICD-10-CM | POA: Diagnosis not present

## 2017-07-21 DIAGNOSIS — G4730 Sleep apnea, unspecified: Secondary | ICD-10-CM | POA: Diagnosis not present

## 2017-07-21 DIAGNOSIS — L97822 Non-pressure chronic ulcer of other part of left lower leg with fat layer exposed: Secondary | ICD-10-CM | POA: Diagnosis not present

## 2017-07-21 DIAGNOSIS — L97522 Non-pressure chronic ulcer of other part of left foot with fat layer exposed: Secondary | ICD-10-CM | POA: Diagnosis not present

## 2017-07-21 DIAGNOSIS — L97222 Non-pressure chronic ulcer of left calf with fat layer exposed: Secondary | ICD-10-CM | POA: Diagnosis not present

## 2017-07-21 DIAGNOSIS — I87332 Chronic venous hypertension (idiopathic) with ulcer and inflammation of left lower extremity: Secondary | ICD-10-CM | POA: Diagnosis not present

## 2017-07-21 DIAGNOSIS — L97512 Non-pressure chronic ulcer of other part of right foot with fat layer exposed: Secondary | ICD-10-CM | POA: Diagnosis not present

## 2017-07-21 DIAGNOSIS — I890 Lymphedema, not elsewhere classified: Secondary | ICD-10-CM | POA: Diagnosis not present

## 2017-07-28 ENCOUNTER — Encounter (HOSPITAL_BASED_OUTPATIENT_CLINIC_OR_DEPARTMENT_OTHER): Payer: BLUE CROSS/BLUE SHIELD | Attending: Internal Medicine

## 2017-07-28 DIAGNOSIS — L03115 Cellulitis of right lower limb: Secondary | ICD-10-CM | POA: Diagnosis not present

## 2017-07-28 DIAGNOSIS — L97512 Non-pressure chronic ulcer of other part of right foot with fat layer exposed: Secondary | ICD-10-CM | POA: Insufficient documentation

## 2017-07-28 DIAGNOSIS — I87332 Chronic venous hypertension (idiopathic) with ulcer and inflammation of left lower extremity: Secondary | ICD-10-CM | POA: Diagnosis not present

## 2017-07-28 DIAGNOSIS — L97522 Non-pressure chronic ulcer of other part of left foot with fat layer exposed: Secondary | ICD-10-CM | POA: Insufficient documentation

## 2017-07-28 DIAGNOSIS — L89523 Pressure ulcer of left ankle, stage 3: Secondary | ICD-10-CM | POA: Diagnosis not present

## 2017-07-28 DIAGNOSIS — L97222 Non-pressure chronic ulcer of left calf with fat layer exposed: Secondary | ICD-10-CM | POA: Diagnosis not present

## 2017-07-28 DIAGNOSIS — L03116 Cellulitis of left lower limb: Secondary | ICD-10-CM | POA: Diagnosis not present

## 2017-07-28 DIAGNOSIS — G82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, complete: Secondary | ICD-10-CM | POA: Diagnosis not present

## 2017-07-28 DIAGNOSIS — S91104A Unspecified open wound of right lesser toe(s) without damage to nail, initial encounter: Secondary | ICD-10-CM | POA: Diagnosis not present

## 2017-08-01 DIAGNOSIS — L89523 Pressure ulcer of left ankle, stage 3: Secondary | ICD-10-CM | POA: Diagnosis not present

## 2017-08-01 DIAGNOSIS — L97512 Non-pressure chronic ulcer of other part of right foot with fat layer exposed: Secondary | ICD-10-CM | POA: Diagnosis not present

## 2017-08-01 DIAGNOSIS — L97522 Non-pressure chronic ulcer of other part of left foot with fat layer exposed: Secondary | ICD-10-CM | POA: Diagnosis not present

## 2017-08-01 DIAGNOSIS — I87332 Chronic venous hypertension (idiopathic) with ulcer and inflammation of left lower extremity: Secondary | ICD-10-CM | POA: Diagnosis not present

## 2017-08-01 DIAGNOSIS — G82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, complete: Secondary | ICD-10-CM | POA: Diagnosis not present

## 2017-08-01 DIAGNOSIS — L03115 Cellulitis of right lower limb: Secondary | ICD-10-CM | POA: Diagnosis not present

## 2017-08-03 ENCOUNTER — Telehealth: Payer: Self-pay | Admitting: Neurology
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
## 2017-08-03 NOTE — Telephone Encounter (Signed)
Called the patient and was able to get them in with Cecille Rubin NP on sept 24. 1:15 pm.

## 2017-08-04 ENCOUNTER — Ambulatory Visit: Payer: Self-pay | Admitting: Neurology

## 2017-08-04 DIAGNOSIS — L97522 Non-pressure chronic ulcer of other part of left foot with fat layer exposed: Secondary | ICD-10-CM | POA: Diagnosis not present

## 2017-08-04 DIAGNOSIS — G82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, complete: Secondary | ICD-10-CM | POA: Diagnosis not present

## 2017-08-04 DIAGNOSIS — L97512 Non-pressure chronic ulcer of other part of right foot with fat layer exposed: Secondary | ICD-10-CM | POA: Diagnosis not present

## 2017-08-04 DIAGNOSIS — L89523 Pressure ulcer of left ankle, stage 3: Secondary | ICD-10-CM | POA: Diagnosis not present

## 2017-08-04 DIAGNOSIS — L03115 Cellulitis of right lower limb: Secondary | ICD-10-CM | POA: Diagnosis not present

## 2017-08-04 DIAGNOSIS — I87332 Chronic venous hypertension (idiopathic) with ulcer and inflammation of left lower extremity: Secondary | ICD-10-CM | POA: Diagnosis not present

## 2017-08-04 DIAGNOSIS — S91301A Unspecified open wound, right foot, initial encounter: Secondary | ICD-10-CM | POA: Diagnosis not present

## 2017-08-04 DIAGNOSIS — S91302A Unspecified open wound, left foot, initial encounter: Secondary | ICD-10-CM | POA: Diagnosis not present

## 2017-08-08 DIAGNOSIS — L03115 Cellulitis of right lower limb: Secondary | ICD-10-CM | POA: Diagnosis not present

## 2017-08-08 DIAGNOSIS — G82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, complete: Secondary | ICD-10-CM | POA: Diagnosis not present

## 2017-08-08 DIAGNOSIS — I87332 Chronic venous hypertension (idiopathic) with ulcer and inflammation of left lower extremity: Secondary | ICD-10-CM | POA: Diagnosis not present

## 2017-08-08 DIAGNOSIS — L97522 Non-pressure chronic ulcer of other part of left foot with fat layer exposed: Secondary | ICD-10-CM | POA: Diagnosis not present

## 2017-08-08 DIAGNOSIS — L89523 Pressure ulcer of left ankle, stage 3: Secondary | ICD-10-CM | POA: Diagnosis not present

## 2017-08-08 DIAGNOSIS — L97512 Non-pressure chronic ulcer of other part of right foot with fat layer exposed: Secondary | ICD-10-CM | POA: Diagnosis not present

## 2017-08-11 DIAGNOSIS — L97512 Non-pressure chronic ulcer of other part of right foot with fat layer exposed: Secondary | ICD-10-CM | POA: Diagnosis not present

## 2017-08-11 DIAGNOSIS — I87312 Chronic venous hypertension (idiopathic) with ulcer of left lower extremity: Secondary | ICD-10-CM | POA: Diagnosis not present

## 2017-08-11 DIAGNOSIS — L03115 Cellulitis of right lower limb: Secondary | ICD-10-CM | POA: Diagnosis not present

## 2017-08-11 DIAGNOSIS — L89523 Pressure ulcer of left ankle, stage 3: Secondary | ICD-10-CM | POA: Diagnosis not present

## 2017-08-11 DIAGNOSIS — L97222 Non-pressure chronic ulcer of left calf with fat layer exposed: Secondary | ICD-10-CM | POA: Diagnosis not present

## 2017-08-11 DIAGNOSIS — I87332 Chronic venous hypertension (idiopathic) with ulcer and inflammation of left lower extremity: Secondary | ICD-10-CM | POA: Diagnosis not present

## 2017-08-11 DIAGNOSIS — L97522 Non-pressure chronic ulcer of other part of left foot with fat layer exposed: Secondary | ICD-10-CM | POA: Diagnosis not present

## 2017-08-11 DIAGNOSIS — G82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, complete: Secondary | ICD-10-CM | POA: Diagnosis not present

## 2017-08-13 ENCOUNTER — Encounter: Payer: Self-pay | Admitting: Nurse Practitioner
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
## 2017-08-15 ENCOUNTER — Encounter: Payer: Self-pay | Admitting: Nurse Practitioner

## 2017-08-15 ENCOUNTER — Ambulatory Visit (INDEPENDENT_AMBULATORY_CARE_PROVIDER_SITE_OTHER): Payer: BLUE CROSS/BLUE SHIELD | Admitting: Nurse Practitioner

## 2017-08-15 DIAGNOSIS — Z9989 Dependence on other enabling machines and devices: Secondary | ICD-10-CM | POA: Diagnosis not present

## 2017-08-15 DIAGNOSIS — I87332 Chronic venous hypertension (idiopathic) with ulcer and inflammation of left lower extremity: Secondary | ICD-10-CM | POA: Diagnosis not present

## 2017-08-15 DIAGNOSIS — L89523 Pressure ulcer of left ankle, stage 3: Secondary | ICD-10-CM | POA: Diagnosis not present

## 2017-08-15 DIAGNOSIS — G4733 Obstructive sleep apnea (adult) (pediatric): Secondary | ICD-10-CM

## 2017-08-15 DIAGNOSIS — L97522 Non-pressure chronic ulcer of other part of left foot with fat layer exposed: Secondary | ICD-10-CM | POA: Diagnosis not present

## 2017-08-15 DIAGNOSIS — L97512 Non-pressure chronic ulcer of other part of right foot with fat layer exposed: Secondary | ICD-10-CM | POA: Diagnosis not present

## 2017-08-15 DIAGNOSIS — L03115 Cellulitis of right lower limb: Secondary | ICD-10-CM | POA: Diagnosis not present

## 2017-08-15 DIAGNOSIS — G82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, complete: Secondary | ICD-10-CM | POA: Diagnosis not present

## 2017-08-15 NOTE — Patient Instructions (Signed)
Will obtain mask fitting in sleep lab today F/U in 2 months for repeat compliance

## 2017-08-15 NOTE — Progress Notes (Signed)
I agree with the assessment and plan as directed by NP .The patient is known to me .   Andrey Hoobler, MD  

## 2017-08-18 DIAGNOSIS — L97512 Non-pressure chronic ulcer of other part of right foot with fat layer exposed: Secondary | ICD-10-CM | POA: Diagnosis not present

## 2017-08-18 DIAGNOSIS — G82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, complete: Secondary | ICD-10-CM | POA: Diagnosis not present

## 2017-08-18 DIAGNOSIS — F331 Major depressive disorder, recurrent, moderate: Secondary | ICD-10-CM | POA: Diagnosis not present

## 2017-08-18 DIAGNOSIS — L03115 Cellulitis of right lower limb: Secondary | ICD-10-CM | POA: Diagnosis not present

## 2017-08-18 DIAGNOSIS — S91302A Unspecified open wound, left foot, initial encounter: Secondary | ICD-10-CM | POA: Diagnosis not present

## 2017-08-18 DIAGNOSIS — S91301A Unspecified open wound, right foot, initial encounter: Secondary | ICD-10-CM | POA: Diagnosis not present

## 2017-08-18 DIAGNOSIS — L89523 Pressure ulcer of left ankle, stage 3: Secondary | ICD-10-CM | POA: Diagnosis not present

## 2017-08-18 DIAGNOSIS — I87332 Chronic venous hypertension (idiopathic) with ulcer and inflammation of left lower extremity: Secondary | ICD-10-CM | POA: Diagnosis not present

## 2017-08-18 DIAGNOSIS — L97522 Non-pressure chronic ulcer of other part of left foot with fat layer exposed: Secondary | ICD-10-CM | POA: Diagnosis not present

## 2017-08-22 ENCOUNTER — Encounter (HOSPITAL_BASED_OUTPATIENT_CLINIC_OR_DEPARTMENT_OTHER): Payer: BLUE CROSS/BLUE SHIELD | Attending: Internal Medicine

## 2017-08-22 DIAGNOSIS — I87332 Chronic venous hypertension (idiopathic) with ulcer and inflammation of left lower extremity: Secondary | ICD-10-CM | POA: Insufficient documentation

## 2017-08-22 DIAGNOSIS — N390 Urinary tract infection, site not specified: Secondary | ICD-10-CM | POA: Insufficient documentation

## 2017-08-22 DIAGNOSIS — L97812 Non-pressure chronic ulcer of other part of right lower leg with fat layer exposed: Secondary | ICD-10-CM | POA: Diagnosis not present

## 2017-08-22 DIAGNOSIS — L97512 Non-pressure chronic ulcer of other part of right foot with fat layer exposed: Secondary | ICD-10-CM | POA: Diagnosis not present

## 2017-08-22 DIAGNOSIS — G82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, complete: Secondary | ICD-10-CM | POA: Insufficient documentation

## 2017-08-22 DIAGNOSIS — L97822 Non-pressure chronic ulcer of other part of left lower leg with fat layer exposed: Secondary | ICD-10-CM | POA: Diagnosis not present

## 2017-08-29 DIAGNOSIS — L97812 Non-pressure chronic ulcer of other part of right lower leg with fat layer exposed: Secondary | ICD-10-CM | POA: Diagnosis not present

## 2017-08-29 DIAGNOSIS — L97512 Non-pressure chronic ulcer of other part of right foot with fat layer exposed: Secondary | ICD-10-CM | POA: Diagnosis not present

## 2017-08-29 DIAGNOSIS — L97822 Non-pressure chronic ulcer of other part of left lower leg with fat layer exposed: Secondary | ICD-10-CM | POA: Diagnosis not present

## 2017-08-29 DIAGNOSIS — I87332 Chronic venous hypertension (idiopathic) with ulcer and inflammation of left lower extremity: Secondary | ICD-10-CM | POA: Diagnosis not present

## 2017-08-29 DIAGNOSIS — G82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, complete: Secondary | ICD-10-CM | POA: Diagnosis not present

## 2017-08-29 DIAGNOSIS — N390 Urinary tract infection, site not specified: Secondary | ICD-10-CM | POA: Diagnosis not present

## 2017-08-31 DIAGNOSIS — L03115 Cellulitis of right lower limb: Secondary | ICD-10-CM | POA: Diagnosis not present

## 2017-08-31 DIAGNOSIS — M7989 Other specified soft tissue disorders: Secondary | ICD-10-CM | POA: Diagnosis not present

## 2017-08-31 DIAGNOSIS — G5791 Unspecified mononeuropathy of right lower limb: Secondary | ICD-10-CM | POA: Diagnosis not present

## 2017-09-01 DIAGNOSIS — L97812 Non-pressure chronic ulcer of other part of right lower leg with fat layer exposed: Secondary | ICD-10-CM | POA: Diagnosis not present

## 2017-09-01 DIAGNOSIS — L97512 Non-pressure chronic ulcer of other part of right foot with fat layer exposed: Secondary | ICD-10-CM | POA: Diagnosis not present

## 2017-09-01 DIAGNOSIS — I87332 Chronic venous hypertension (idiopathic) with ulcer and inflammation of left lower extremity: Secondary | ICD-10-CM | POA: Diagnosis not present

## 2017-09-01 DIAGNOSIS — L03116 Cellulitis of left lower limb: Secondary | ICD-10-CM | POA: Diagnosis not present

## 2017-09-01 DIAGNOSIS — L97222 Non-pressure chronic ulcer of left calf with fat layer exposed: Secondary | ICD-10-CM | POA: Diagnosis not present

## 2017-09-01 DIAGNOSIS — L97522 Non-pressure chronic ulcer of other part of left foot with fat layer exposed: Secondary | ICD-10-CM | POA: Diagnosis not present

## 2017-09-01 DIAGNOSIS — I872 Venous insufficiency (chronic) (peripheral): Secondary | ICD-10-CM | POA: Diagnosis not present

## 2017-09-01 DIAGNOSIS — G82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, complete: Secondary | ICD-10-CM | POA: Diagnosis not present

## 2017-09-01 DIAGNOSIS — N390 Urinary tract infection, site not specified: Secondary | ICD-10-CM | POA: Diagnosis not present

## 2017-09-01 DIAGNOSIS — L97822 Non-pressure chronic ulcer of other part of left lower leg with fat layer exposed: Secondary | ICD-10-CM | POA: Diagnosis not present

## 2017-09-05 DIAGNOSIS — I87332 Chronic venous hypertension (idiopathic) with ulcer and inflammation of left lower extremity: Secondary | ICD-10-CM | POA: Diagnosis not present

## 2017-09-05 DIAGNOSIS — G82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, complete: Secondary | ICD-10-CM | POA: Diagnosis not present

## 2017-09-05 DIAGNOSIS — N390 Urinary tract infection, site not specified: Secondary | ICD-10-CM | POA: Diagnosis not present

## 2017-09-05 DIAGNOSIS — L97822 Non-pressure chronic ulcer of other part of left lower leg with fat layer exposed: Secondary | ICD-10-CM | POA: Diagnosis not present

## 2017-09-05 DIAGNOSIS — L97512 Non-pressure chronic ulcer of other part of right foot with fat layer exposed: Secondary | ICD-10-CM | POA: Diagnosis not present

## 2017-09-05 DIAGNOSIS — L97812 Non-pressure chronic ulcer of other part of right lower leg with fat layer exposed: Secondary | ICD-10-CM | POA: Diagnosis not present

## 2017-09-08 DIAGNOSIS — I87332 Chronic venous hypertension (idiopathic) with ulcer and inflammation of left lower extremity: Secondary | ICD-10-CM | POA: Diagnosis not present

## 2017-09-08 DIAGNOSIS — N390 Urinary tract infection, site not specified: Secondary | ICD-10-CM | POA: Diagnosis not present

## 2017-09-08 DIAGNOSIS — G82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, complete: Secondary | ICD-10-CM | POA: Diagnosis not present

## 2017-09-08 DIAGNOSIS — L97512 Non-pressure chronic ulcer of other part of right foot with fat layer exposed: Secondary | ICD-10-CM | POA: Diagnosis not present

## 2017-09-08 DIAGNOSIS — L97812 Non-pressure chronic ulcer of other part of right lower leg with fat layer exposed: Secondary | ICD-10-CM | POA: Diagnosis not present

## 2017-09-08 DIAGNOSIS — L97822 Non-pressure chronic ulcer of other part of left lower leg with fat layer exposed: Secondary | ICD-10-CM | POA: Diagnosis not present

## 2017-09-12 DIAGNOSIS — L97822 Non-pressure chronic ulcer of other part of left lower leg with fat layer exposed: Secondary | ICD-10-CM | POA: Diagnosis not present

## 2017-09-12 DIAGNOSIS — N390 Urinary tract infection, site not specified: Secondary | ICD-10-CM | POA: Diagnosis not present

## 2017-09-12 DIAGNOSIS — L97512 Non-pressure chronic ulcer of other part of right foot with fat layer exposed: Secondary | ICD-10-CM | POA: Diagnosis not present

## 2017-09-12 DIAGNOSIS — I87332 Chronic venous hypertension (idiopathic) with ulcer and inflammation of left lower extremity: Secondary | ICD-10-CM | POA: Diagnosis not present

## 2017-09-12 DIAGNOSIS — L97812 Non-pressure chronic ulcer of other part of right lower leg with fat layer exposed: Secondary | ICD-10-CM | POA: Diagnosis not present

## 2017-09-12 DIAGNOSIS — G82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, complete: Secondary | ICD-10-CM | POA: Diagnosis not present

## 2017-09-15 DIAGNOSIS — L97812 Non-pressure chronic ulcer of other part of right lower leg with fat layer exposed: Secondary | ICD-10-CM | POA: Diagnosis not present

## 2017-09-15 DIAGNOSIS — L97512 Non-pressure chronic ulcer of other part of right foot with fat layer exposed: Secondary | ICD-10-CM | POA: Diagnosis not present

## 2017-09-15 DIAGNOSIS — L97822 Non-pressure chronic ulcer of other part of left lower leg with fat layer exposed: Secondary | ICD-10-CM | POA: Diagnosis not present

## 2017-09-15 DIAGNOSIS — L97222 Non-pressure chronic ulcer of left calf with fat layer exposed: Secondary | ICD-10-CM | POA: Diagnosis not present

## 2017-09-15 DIAGNOSIS — G82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, complete: Secondary | ICD-10-CM | POA: Diagnosis not present

## 2017-09-15 DIAGNOSIS — I87332 Chronic venous hypertension (idiopathic) with ulcer and inflammation of left lower extremity: Secondary | ICD-10-CM | POA: Diagnosis not present

## 2017-09-15 DIAGNOSIS — I87313 Chronic venous hypertension (idiopathic) with ulcer of bilateral lower extremity: Secondary | ICD-10-CM | POA: Diagnosis not present

## 2017-09-15 DIAGNOSIS — N390 Urinary tract infection, site not specified: Secondary | ICD-10-CM | POA: Diagnosis not present

## 2017-09-22 ENCOUNTER — Encounter (HOSPITAL_BASED_OUTPATIENT_CLINIC_OR_DEPARTMENT_OTHER): Payer: BLUE CROSS/BLUE SHIELD | Attending: Internal Medicine

## 2017-09-22 DIAGNOSIS — L97819 Non-pressure chronic ulcer of other part of right lower leg with unspecified severity: Secondary | ICD-10-CM | POA: Insufficient documentation

## 2017-09-22 DIAGNOSIS — L03115 Cellulitis of right lower limb: Secondary | ICD-10-CM | POA: Diagnosis not present

## 2017-09-22 DIAGNOSIS — B9562 Methicillin resistant Staphylococcus aureus infection as the cause of diseases classified elsewhere: Secondary | ICD-10-CM | POA: Insufficient documentation

## 2017-09-22 DIAGNOSIS — L97529 Non-pressure chronic ulcer of other part of left foot with unspecified severity: Secondary | ICD-10-CM | POA: Insufficient documentation

## 2017-09-22 DIAGNOSIS — G82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, complete: Secondary | ICD-10-CM | POA: Insufficient documentation

## 2017-09-22 DIAGNOSIS — G4730 Sleep apnea, unspecified: Secondary | ICD-10-CM | POA: Insufficient documentation

## 2017-09-22 DIAGNOSIS — I87312 Chronic venous hypertension (idiopathic) with ulcer of left lower extremity: Secondary | ICD-10-CM | POA: Diagnosis not present

## 2017-09-22 DIAGNOSIS — L97511 Non-pressure chronic ulcer of other part of right 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: Secondary | ICD-10-CM | POA: Insufficient documentation

## 2017-09-22 DIAGNOSIS — L97222 Non-pressure chronic ulcer of left calf with fat layer exposed: Secondary | ICD-10-CM | POA: Diagnosis not present

## 2017-09-22 DIAGNOSIS — L97822 Non-pressure chronic ulcer of other part of left lower leg with fat layer exposed: Secondary | ICD-10-CM | POA: Insufficient documentation

## 2017-09-22 DIAGNOSIS — I87332 Chronic venous hypertension (idiopathic) with ulcer and inflammation of left lower extremity: Secondary | ICD-10-CM | POA: Diagnosis not present

## 2017-09-22 DIAGNOSIS — I10 Essential (primary) hypertension: Secondary | ICD-10-CM | POA: Insufficient documentation

## 2017-09-23 DIAGNOSIS — N3000 Acute cystitis without hematuria: Secondary | ICD-10-CM | POA: Diagnosis not present

## 2017-09-23 DIAGNOSIS — N3090 Cystitis, unspecified without hematuria: Secondary | ICD-10-CM | POA: Diagnosis not present

## 2017-09-23 DIAGNOSIS — R300 Dysuria: Secondary | ICD-10-CM | POA: Diagnosis not present

## 2017-09-29 ENCOUNTER — Ambulatory Visit (HOSPITAL_COMMUNITY)
Admission: RE | Admit: 2017-09-29 | Discharge: 2017-09-29 | Disposition: A | Discharge status: Home / Self Care | Payer: BLUE CROSS/BLUE SHIELD | Source: Ambulatory Visit | Attending: Internal Medicine | Admitting: Internal Medicine

## 2017-09-29 ENCOUNTER — Other Ambulatory Visit: Payer: Self-pay | Admitting: Internal Medicine

## 2017-09-29 DIAGNOSIS — L97529 Non-pressure chronic ulcer of other part of left foot with unspecified severity: Secondary | ICD-10-CM | POA: Diagnosis not present

## 2017-09-29 DIAGNOSIS — I87312 Chronic venous hypertension (idiopathic) with ulcer of left lower extremity: Secondary | ICD-10-CM | POA: Diagnosis not present

## 2017-09-29 DIAGNOSIS — L97822 Non-pressure chronic ulcer of other part of left lower leg with fat layer exposed: Secondary | ICD-10-CM | POA: Diagnosis not present

## 2017-09-29 DIAGNOSIS — M86171 Other acute osteomyelitis, right ankle and foot: Secondary | ICD-10-CM

## 2017-09-29 DIAGNOSIS — S91301A Unspecified open wound, right foot, initial encounter: Secondary | ICD-10-CM | POA: Insufficient documentation

## 2017-09-29 DIAGNOSIS — L03116 Cellulitis of left lower limb: Secondary | ICD-10-CM | POA: Diagnosis not present

## 2017-09-29 DIAGNOSIS — L03115 Cellulitis of right lower limb: Secondary | ICD-10-CM | POA: Diagnosis not present

## 2017-09-29 DIAGNOSIS — G82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, complete: Secondary | ICD-10-CM | POA: Diagnosis not present

## 2017-09-29 DIAGNOSIS — L97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: Secondary | ICD-10-CM | POA: Diagnosis not present

## 2017-09-29 DIAGNOSIS — L97511 Non-pressure chronic ulcer of other part of right 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: Secondary | ICD-10-CM | POA: Diagnosis not present

## 2017-09-29 DIAGNOSIS — M7989 Other specified soft tissue disorders: Secondary | ICD-10-CM | POA: Diagnosis not present

## 2017-09-29 DIAGNOSIS — I87332 Chronic venous hypertension (idiopathic) with ulcer and inflammation of left lower extremity: Secondary | ICD-10-CM | POA: Diagnosis not present

## 2017-09-29 DIAGNOSIS — I10 Essential (primary) hypertension: Secondary | ICD-10-CM | POA: Diagnosis not present

## 2017-09-29 DIAGNOSIS — L97819 Non-pressure chronic ulcer of other part of right lower leg with unspecified severity: Secondary | ICD-10-CM | POA: Diagnosis not present

## 2017-09-29 DIAGNOSIS — B9562 Methicillin resistant Staphylococcus aureus infection as the cause of diseases classified elsewhere: Secondary | ICD-10-CM | POA: Diagnosis not present

## 2017-09-29 DIAGNOSIS — G4730 Sleep apnea, unspecified: Secondary | ICD-10-CM | POA: Diagnosis not present

## 2017-09-29 DIAGNOSIS — S91101A Unspecified open wound of right great toe without damage to nail, initial encounter: Secondary | ICD-10-CM | POA: Diagnosis not present

## 2017-09-29 DIAGNOSIS — S91302A Unspecified open wound, left foot, initial encounter: Secondary | ICD-10-CM | POA: Diagnosis not present

## 2017-09-30 ENCOUNTER — Other Ambulatory Visit (HOSPITAL_COMMUNITY)
Admit: 2017-09-30 | Discharge: 2017-09-30 | Disposition: A | Discharge status: Home / Self Care | Payer: BLUE CROSS/BLUE SHIELD | Source: Other Acute Inpatient Hospital | Attending: Internal Medicine | Admitting: Internal Medicine

## 2017-09-30 DIAGNOSIS — L97511 Non-pressure chronic ulcer of other part of right 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: Secondary | ICD-10-CM | POA: Diagnosis not present

## 2017-10-03 LAB — AEROBIC CULTURE W GRAM STAIN (SUPERFICIAL SPECIMEN)

## 2017-10-06 DIAGNOSIS — L89523 Pressure ulcer of left ankle, stage 3: Secondary | ICD-10-CM | POA: Diagnosis not present

## 2017-10-06 DIAGNOSIS — B9562 Methicillin resistant Staphylococcus aureus infection as the cause of diseases classified elsewhere: Secondary | ICD-10-CM | POA: Diagnosis not present

## 2017-10-06 DIAGNOSIS — L03115 Cellulitis of right lower limb: Secondary | ICD-10-CM | POA: Diagnosis not present

## 2017-10-06 DIAGNOSIS — L97511 Non-pressure chronic ulcer of other part of right 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: Secondary | ICD-10-CM | POA: Diagnosis not present

## 2017-10-06 DIAGNOSIS — I10 Essential (primary) hypertension: Secondary | ICD-10-CM | POA: Diagnosis not present

## 2017-10-06 DIAGNOSIS — G82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, complete: Secondary | ICD-10-CM | POA: Diagnosis not present

## 2017-10-06 DIAGNOSIS — L97212 Non-pressure chronic ulcer of right calf with fat layer exposed: Secondary | ICD-10-CM | POA: Diagnosis not present

## 2017-10-06 DIAGNOSIS — I890 Lymphedema, not elsewhere classified: Secondary | ICD-10-CM | POA: Diagnosis not present

## 2017-10-06 DIAGNOSIS — L97512 Non-pressure chronic ulcer of other part of right foot with fat layer exposed: Secondary | ICD-10-CM | POA: Diagnosis not present

## 2017-10-06 DIAGNOSIS — A4902 Methicillin resistant Staphylococcus aureus infection, unspecified site: Secondary | ICD-10-CM | POA: Diagnosis not present

## 2017-10-06 DIAGNOSIS — L97522 Non-pressure chronic ulcer of other part of left foot with fat layer exposed: Secondary | ICD-10-CM | POA: Diagnosis not present

## 2017-10-06 DIAGNOSIS — L89513 Pressure ulcer of right ankle, stage 3: Secondary | ICD-10-CM | POA: Diagnosis not present

## 2017-10-06 DIAGNOSIS — L97819 Non-pressure chronic ulcer of other part of right lower leg with unspecified severity: Secondary | ICD-10-CM | POA: Diagnosis not present

## 2017-10-06 DIAGNOSIS — L97529 Non-pressure chronic ulcer of other part of left foot with unspecified severity: Secondary | ICD-10-CM | POA: Diagnosis not present

## 2017-10-06 DIAGNOSIS — G4730 Sleep apnea, unspecified: Secondary | ICD-10-CM | POA: Diagnosis not present

## 2017-10-06 DIAGNOSIS — L97822 Non-pressure chronic ulcer of other part of left lower leg with fat layer exposed: Secondary | ICD-10-CM | POA: Diagnosis not present

## 2017-10-06 DIAGNOSIS — I87332 Chronic venous hypertension (idiopathic) with ulcer and inflammation of left lower extremity: Secondary | ICD-10-CM | POA: Diagnosis not present

## 2017-10-10 DIAGNOSIS — G82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, complete: Secondary | ICD-10-CM | POA: Diagnosis not present

## 2017-10-10 DIAGNOSIS — L97822 Non-pressure chronic ulcer of other part of left lower leg with fat layer exposed: Secondary | ICD-10-CM | POA: Diagnosis not present

## 2017-10-10 DIAGNOSIS — I10 Essential (primary) hypertension: Secondary | ICD-10-CM | POA: Diagnosis not present

## 2017-10-10 DIAGNOSIS — L97819 Non-pressure chronic ulcer of other part of right lower leg with unspecified severity: Secondary | ICD-10-CM | POA: Diagnosis not present

## 2017-10-10 DIAGNOSIS — B9562 Methicillin resistant Staphylococcus aureus infection as the cause of diseases classified elsewhere: Secondary | ICD-10-CM | POA: Diagnosis not present

## 2017-10-10 DIAGNOSIS — I87332 Chronic venous hypertension (idiopathic) with ulcer and inflammation of left lower extremity: Secondary | ICD-10-CM | POA: Diagnosis not present

## 2017-10-10 DIAGNOSIS — L97529 Non-pressure chronic ulcer of other part of left foot with unspecified severity: Secondary | ICD-10-CM | POA: Diagnosis not present

## 2017-10-10 DIAGNOSIS — L97511 Non-pressure chronic ulcer of other part of right 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: Secondary | ICD-10-CM | POA: Diagnosis not present

## 2017-10-10 DIAGNOSIS — L03115 Cellulitis of right lower limb: Secondary | ICD-10-CM | POA: Diagnosis not present

## 2017-10-10 DIAGNOSIS — G4730 Sleep apnea, unspecified: Secondary | ICD-10-CM | POA: Diagnosis not present

## 2017-10-12 DIAGNOSIS — R300 Dysuria: Secondary | ICD-10-CM | POA: Diagnosis not present

## 2017-10-12 DIAGNOSIS — N3001 Acute cystitis with hematuria: Secondary | ICD-10-CM | POA: Diagnosis not present

## 2017-10-17 DIAGNOSIS — L97819 Non-pressure chronic ulcer of other part of right lower leg with unspecified severity: Secondary | ICD-10-CM | POA: Diagnosis not present

## 2017-10-17 DIAGNOSIS — L97511 Non-pressure chronic ulcer of other part of right 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: Secondary | ICD-10-CM | POA: Diagnosis not present

## 2017-10-17 DIAGNOSIS — L97822 Non-pressure chronic ulcer of other part of left lower leg with fat layer exposed: Secondary | ICD-10-CM | POA: Diagnosis not present

## 2017-10-17 DIAGNOSIS — G4730 Sleep apnea, unspecified: Secondary | ICD-10-CM | POA: Diagnosis not present

## 2017-10-17 DIAGNOSIS — B9562 Methicillin resistant Staphylococcus aureus infection as the cause of diseases classified elsewhere: Secondary | ICD-10-CM | POA: Diagnosis not present

## 2017-10-17 DIAGNOSIS — I10 Essential (primary) hypertension: Secondary | ICD-10-CM | POA: Diagnosis not present

## 2017-10-17 DIAGNOSIS — L03115 Cellulitis of right lower limb: Secondary | ICD-10-CM | POA: Diagnosis not present

## 2017-10-17 DIAGNOSIS — G82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, complete: Secondary | ICD-10-CM | POA: Diagnosis not present

## 2017-10-17 DIAGNOSIS — L97529 Non-pressure chronic ulcer of other part of left foot with unspecified severity: Secondary | ICD-10-CM | POA: Diagnosis not present

## 2017-10-17 DIAGNOSIS — I87332 Chronic venous hypertension (idiopathic) with ulcer and inflammation of left lower extremity: Secondary | ICD-10-CM | POA: Diagnosis not present

## 2017-10-20 DIAGNOSIS — L97822 Non-pressure chronic ulcer of other part of left lower leg with fat layer exposed: Secondary | ICD-10-CM | POA: Diagnosis not present

## 2017-10-20 DIAGNOSIS — L03115 Cellulitis of right lower limb: Secondary | ICD-10-CM | POA: Diagnosis not present

## 2017-10-20 DIAGNOSIS — I87312 Chronic venous hypertension (idiopathic) with ulcer of left lower extremity: Secondary | ICD-10-CM | POA: Diagnosis not present

## 2017-10-20 DIAGNOSIS — B9562 Methicillin resistant Staphylococcus aureus infection as the cause of diseases classified elsewhere: Secondary | ICD-10-CM | POA: Diagnosis not present
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
## 2017-10-24 ENCOUNTER — Encounter (HOSPITAL_BASED_OUTPATIENT_CLINIC_OR_DEPARTMENT_OTHER): Payer: BLUE CROSS/BLUE SHIELD | Attending: Internal Medicine

## 2017-10-24 ENCOUNTER — Encounter: Payer: Self-pay | Admitting: Nurse Practitioner

## 2017-10-24 ENCOUNTER — Ambulatory Visit: Payer: BLUE CROSS/BLUE SHIELD | Admitting: Nurse Practitioner

## 2017-10-24 VITALS — BP 123/72 | HR 86 | Ht 70.0 in | Wt 280.0 lb

## 2017-10-24 DIAGNOSIS — L97522 Non-pressure chronic ulcer of other part of left foot with fat layer exposed: Secondary | ICD-10-CM | POA: Insufficient documentation

## 2017-10-24 DIAGNOSIS — Z9989 Dependence on other enabling machines and devices: Secondary | ICD-10-CM | POA: Diagnosis not present

## 2017-10-24 DIAGNOSIS — I10 Essential (primary) hypertension: Secondary | ICD-10-CM | POA: Insufficient documentation

## 2017-10-24 DIAGNOSIS — G4733 Obstructive sleep apnea (adult) (pediatric): Secondary | ICD-10-CM | POA: Diagnosis not present

## 2017-10-24 DIAGNOSIS — I87332 Chronic venous hypertension (idiopathic) with ulcer and inflammation of left lower extremity: Secondary | ICD-10-CM | POA: Insufficient documentation

## 2017-10-24 DIAGNOSIS — G4730 Sleep apnea, unspecified: Secondary | ICD-10-CM | POA: Insufficient documentation

## 2017-10-24 DIAGNOSIS — L97512 Non-pressure chronic ulcer of other part of right foot with fat layer exposed: Secondary | ICD-10-CM | POA: Insufficient documentation

## 2017-10-24 DIAGNOSIS — G82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, complete: Secondary | ICD-10-CM | POA: Insufficient documentation

## 2017-10-24 DIAGNOSIS — L03115 Cellulitis of right lower limb: Secondary | ICD-10-CM | POA: Insufficient documentation

## 2017-10-24 NOTE — Progress Notes (Signed)
I agree with the assessment and plan as directed by NP .The patient is known to me .   Delta Deshmukh, MD  

## 2017-10-24 NOTE — Patient Instructions (Signed)
Will obtain mask fitting in sleep lab today by Robin Reviewed compliance data with patient compliance needs to be at least 4 hours usage each night. CPAP compliance at 32% F/U in 3 months for repeat compliance check next with [place]

## 2017-10-27 DIAGNOSIS — L97522 Non-pressure chronic ulcer of other part of left foot with fat layer exposed: Secondary | ICD-10-CM | POA: Diagnosis not present

## 2017-10-27 DIAGNOSIS — G4730 Sleep apnea, unspecified: Secondary | ICD-10-CM | POA: Diagnosis not present

## 2017-10-27 DIAGNOSIS — L03115 Cellulitis of right lower limb: Secondary | ICD-10-CM | POA: Diagnosis not present

## 2017-10-27 DIAGNOSIS — L97512 Non-pressure chronic ulcer of other part of right foot with fat layer exposed: Secondary | ICD-10-CM | POA: Diagnosis not present

## 2017-10-27 DIAGNOSIS — L97222 Non-pressure chronic ulcer of left calf with fat layer exposed: Secondary | ICD-10-CM | POA: Diagnosis not present

## 2017-10-27 DIAGNOSIS — G4733 Obstructive sleep apnea (adult) (pediatric): Secondary | ICD-10-CM | POA: Diagnosis not present

## 2017-10-27 DIAGNOSIS — I10 Essential (primary) hypertension: Secondary | ICD-10-CM | POA: Diagnosis not present

## 2017-10-27 DIAGNOSIS — I87332 Chronic venous hypertension (idiopathic) with ulcer and inflammation of left lower extremity: Secondary | ICD-10-CM | POA: Diagnosis not present

## 2017-10-27 DIAGNOSIS — G82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, complete: Secondary | ICD-10-CM | POA: Diagnosis not present

## 2017-11-02 DIAGNOSIS — R5383 Other fatigue: Secondary | ICD-10-CM | POA: Diagnosis not present

## 2017-11-02 DIAGNOSIS — R635 Abnormal weight gain: Secondary | ICD-10-CM | POA: Diagnosis not present

## 2017-11-02 DIAGNOSIS — Z79899 Other long term (current) drug therapy: Secondary | ICD-10-CM | POA: Diagnosis not present

## 2017-11-02 DIAGNOSIS — R0602 Shortness of breath: Secondary | ICD-10-CM | POA: Diagnosis not present

## 2017-11-02 DIAGNOSIS — I10 Essential (primary) hypertension: Secondary | ICD-10-CM | POA: Diagnosis not present

## 2017-11-03 ENCOUNTER — Other Ambulatory Visit (HOSPITAL_COMMUNITY)
Admission: RE | Admit: 2017-11-03 | Discharge: 2017-11-03 | Disposition: A | Discharge status: Home / Self Care | Payer: BLUE CROSS/BLUE SHIELD | Source: Other Acute Inpatient Hospital | Attending: Internal Medicine | Admitting: Internal Medicine

## 2017-11-03 DIAGNOSIS — L97522 Non-pressure chronic ulcer of other part of left foot with fat layer exposed: Secondary | ICD-10-CM | POA: Diagnosis not present

## 2017-11-03 DIAGNOSIS — G82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, complete: Secondary | ICD-10-CM | POA: Diagnosis not present

## 2017-11-03 DIAGNOSIS — G4730 Sleep apnea, unspecified: Secondary | ICD-10-CM | POA: Diagnosis not present

## 2017-11-03 DIAGNOSIS — L97512 Non-pressure chronic ulcer of other part of right foot with fat layer exposed: Secondary | ICD-10-CM | POA: Diagnosis not present

## 2017-11-03 DIAGNOSIS — I87332 Chronic venous hypertension (idiopathic) with ulcer and inflammation of left lower extremity: Secondary | ICD-10-CM | POA: Diagnosis not present

## 2017-11-03 DIAGNOSIS — X58XXXA Exposure to other specified factors, initial encounter: Secondary | ICD-10-CM | POA: Diagnosis not present

## 2017-11-03 DIAGNOSIS — S91105A Unspecified open wound of left lesser toe(s) without damage to nail, initial encounter: Secondary | ICD-10-CM | POA: Diagnosis not present

## 2017-11-03 DIAGNOSIS — I10 Essential (primary) hypertension: Secondary | ICD-10-CM | POA: Diagnosis not present

## 2017-11-03 DIAGNOSIS — L03115 Cellulitis of right lower limb: Secondary | ICD-10-CM | POA: Diagnosis not present

## 2017-11-03 DIAGNOSIS — S91109A Unspecified open wound of unspecified toe(s) without damage to nail, initial encounter: Secondary | ICD-10-CM | POA: Diagnosis not present

## 2017-11-05 DIAGNOSIS — N3091 Cystitis, unspecified with hematuria: Secondary | ICD-10-CM | POA: Diagnosis not present

## 2017-11-05 DIAGNOSIS — N3000 Acute cystitis without hematuria: Secondary | ICD-10-CM | POA: Diagnosis not present

## 2017-11-07 LAB — AEROBIC CULTURE W GRAM STAIN (SUPERFICIAL SPECIMEN)

## 2017-11-10 DIAGNOSIS — L97222 Non-pressure chronic ulcer of left calf with fat layer exposed: Secondary | ICD-10-CM | POA: Diagnosis not present

## 2017-11-10 DIAGNOSIS — G4730 Sleep apnea, unspecified: Secondary | ICD-10-CM | POA: Diagnosis not present

## 2017-11-10 DIAGNOSIS — I87312 Chronic venous hypertension (idiopathic) with ulcer of left lower extremity: Secondary | ICD-10-CM | POA: Diagnosis not present

## 2017-11-10 DIAGNOSIS — L97522 Non-pressure chronic ulcer of other part of left foot with fat layer exposed: Secondary | ICD-10-CM | POA: Diagnosis not present

## 2017-11-10 DIAGNOSIS — G8220 Paraplegia, unspecified: Secondary | ICD-10-CM | POA: Diagnosis not present

## 2017-11-10 DIAGNOSIS — I87332 Chronic venous hypertension (idiopathic) with ulcer and inflammation of left lower extremity: Secondary | ICD-10-CM | POA: Diagnosis not present

## 2017-11-10 DIAGNOSIS — I10 Essential (primary) hypertension: Secondary | ICD-10-CM | POA: Diagnosis not present

## 2017-11-10 DIAGNOSIS — L97512 Non-pressure chronic ulcer of other part of right foot with fat layer exposed: Secondary | ICD-10-CM | POA: Diagnosis not present

## 2017-11-10 DIAGNOSIS — L03115 Cellulitis of right lower limb: Secondary | ICD-10-CM | POA: Diagnosis not present

## 2017-11-10 DIAGNOSIS — Z23 Encounter for immunization: Secondary | ICD-10-CM | POA: Diagnosis not present

## 2017-11-10 DIAGNOSIS — S91301A Unspecified open wound, right foot, initial encounter: Secondary | ICD-10-CM | POA: Diagnosis not present

## 2017-11-10 DIAGNOSIS — S91302A Unspecified open wound, left foot, initial encounter: Secondary | ICD-10-CM | POA: Diagnosis not present

## 2017-11-10 DIAGNOSIS — G82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, complete: Secondary | ICD-10-CM | POA: Diagnosis not present

## 2017-11-10 DIAGNOSIS — F329 Major depressive disorder, single episode, unspecified: Secondary | ICD-10-CM | POA: Diagnosis not present

## 2017-11-17 DIAGNOSIS — I10 Essential (primary) hypertension: Secondary | ICD-10-CM | POA: Diagnosis not present

## 2017-11-17 DIAGNOSIS — I87332 Chronic venous hypertension (idiopathic) with ulcer and inflammation of left lower extremity: Secondary | ICD-10-CM | POA: Diagnosis not present

## 2017-11-17 DIAGNOSIS — L03115 Cellulitis of right lower limb: Secondary | ICD-10-CM | POA: Diagnosis not present

## 2017-11-17 DIAGNOSIS — L97522 Non-pressure chronic ulcer of other part of left foot with fat layer exposed: Secondary | ICD-10-CM | POA: Diagnosis not present

## 2017-11-17 DIAGNOSIS — S91104A Unspecified open wound of right lesser toe(s) without damage to nail, initial encounter: Secondary | ICD-10-CM | POA: Diagnosis not present

## 2017-11-17 DIAGNOSIS — S91105A Unspecified open wound of left lesser toe(s) without damage to nail, initial encounter: Secondary | ICD-10-CM | POA: Diagnosis not present

## 2017-11-17 DIAGNOSIS — L97512 Non-pressure chronic ulcer of other part of right foot with fat layer exposed: Secondary | ICD-10-CM | POA: Diagnosis not present

## 2017-11-17 DIAGNOSIS — G4730 Sleep apnea, unspecified: Secondary | ICD-10-CM | POA: Diagnosis not present

## 2017-11-17 DIAGNOSIS — G82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, complete: Secondary | ICD-10-CM | POA: Diagnosis not present

## 2017-11-23 DIAGNOSIS — N319 Neuromuscular dysfunction of bladder, unspecified: Secondary | ICD-10-CM | POA: Diagnosis not present

## 2017-11-23 DIAGNOSIS — N3020 Other chronic cystitis without hematuria: Secondary | ICD-10-CM | POA: Diagnosis not present

## 2017-11-23 DIAGNOSIS — N390 Urinary tract infection, site not specified: Secondary | ICD-10-CM | POA: Diagnosis not present

## 2017-11-23 DIAGNOSIS — B9620 Unspecified Escherichia coli [E. coli] as the cause of diseases classified elsewhere: Secondary | ICD-10-CM | POA: Diagnosis not present

## 2017-11-24 ENCOUNTER — Encounter (HOSPITAL_BASED_OUTPATIENT_CLINIC_OR_DEPARTMENT_OTHER): Payer: BLUE CROSS/BLUE SHIELD | Attending: Internal Medicine

## 2017-11-24 DIAGNOSIS — L97522 Non-pressure chronic ulcer of other part of left foot with fat layer exposed: Secondary | ICD-10-CM | POA: Diagnosis not present

## 2017-11-24 DIAGNOSIS — B9562 Methicillin resistant Staphylococcus aureus infection as the cause of diseases classified elsewhere: Secondary | ICD-10-CM | POA: Diagnosis not present

## 2017-11-24 DIAGNOSIS — I10 Essential (primary) hypertension: Secondary | ICD-10-CM | POA: Diagnosis not present

## 2017-11-24 DIAGNOSIS — L03115 Cellulitis of right lower limb: Secondary | ICD-10-CM | POA: Diagnosis not present

## 2017-11-24 DIAGNOSIS — L89320 Pressure ulcer of left buttock, unstageable: Secondary | ICD-10-CM | POA: Insufficient documentation

## 2017-11-24 DIAGNOSIS — G82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, complete: Secondary | ICD-10-CM | POA: Diagnosis not present

## 2017-11-24 DIAGNOSIS — L97511 Non-pressure chronic ulcer of other part of right 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: Secondary | ICD-10-CM | POA: Diagnosis not present

## 2017-11-24 DIAGNOSIS — I87332 Chronic venous hypertension (idiopathic) with ulcer and inflammation of left lower extremity: Secondary | ICD-10-CM | POA: Insufficient documentation

## 2017-11-24 DIAGNOSIS — L97822 Non-pressure chronic ulcer of other part of left lower leg with fat layer exposed: Secondary | ICD-10-CM | POA: Insufficient documentation

## 2017-11-24 DIAGNOSIS — S91302A Unspecified open wound, left foot, initial encounter: Secondary | ICD-10-CM | POA: Diagnosis not present

## 2017-12-01 DIAGNOSIS — B9562 Methicillin resistant Staphylococcus aureus infection as the cause of diseases classified elsewhere: Secondary | ICD-10-CM | POA: Diagnosis not present

## 2017-12-01 DIAGNOSIS — I87312 Chronic venous hypertension (idiopathic) with ulcer of left lower extremity: Secondary | ICD-10-CM | POA: Diagnosis not present

## 2017-12-01 DIAGNOSIS — L97511 Non-pressure chronic ulcer of other part of right 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: Secondary | ICD-10-CM | POA: Diagnosis not present

## 2017-12-01 DIAGNOSIS — I87332 Chronic venous hypertension (idiopathic) with ulcer and inflammation of left lower extremity: Secondary | ICD-10-CM | POA: Diagnosis not present

## 2017-12-01 DIAGNOSIS — S91302A Unspecified open wound, left foot, initial encounter: Secondary | ICD-10-CM | POA: Diagnosis not present

## 2017-12-01 DIAGNOSIS — L89320 Pressure ulcer of left buttock, unstageable: Secondary | ICD-10-CM | POA: Diagnosis not present

## 2017-12-01 DIAGNOSIS — L97822 Non-pressure chronic ulcer of other part of left lower leg with fat layer exposed: Secondary | ICD-10-CM | POA: Diagnosis not present

## 2017-12-01 DIAGNOSIS — I10 Essential (primary) hypertension: Secondary | ICD-10-CM | POA: Diagnosis not present

## 2017-12-01 DIAGNOSIS — L03115 Cellulitis of right lower limb: Secondary | ICD-10-CM | POA: Diagnosis not present

## 2017-12-01 DIAGNOSIS — L97222 Non-pressure chronic ulcer of left calf with fat layer exposed: Secondary | ICD-10-CM | POA: Diagnosis not present

## 2017-12-01 DIAGNOSIS — G82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, complete: Secondary | ICD-10-CM | POA: Diagnosis not present

## 2017-12-01 DIAGNOSIS — L97522 Non-pressure chronic ulcer of other part of left foot with fat layer exposed: Secondary | ICD-10-CM | POA: Diagnosis not present

## 2017-12-02 DIAGNOSIS — Z1331 Encounter for screening for depression: Secondary | ICD-10-CM | POA: Diagnosis not present

## 2017-12-02 DIAGNOSIS — E291 Testicular hypofunction: Secondary | ICD-10-CM | POA: Diagnosis not present

## 2017-12-02 DIAGNOSIS — R5383 Other fatigue: Secondary | ICD-10-CM | POA: Diagnosis not present

## 2017-12-02 DIAGNOSIS — R635 Abnormal weight gain: Secondary | ICD-10-CM | POA: Diagnosis not present

## 2017-12-02 DIAGNOSIS — Z6841 Body Mass Index (BMI) 40.0 and over, adult: Secondary | ICD-10-CM | POA: Diagnosis not present

## 2017-12-05 DIAGNOSIS — B9562 Methicillin resistant Staphylococcus aureus infection as the cause of diseases classified elsewhere: Secondary | ICD-10-CM | POA: Diagnosis not present

## 2017-12-05 DIAGNOSIS — I87312 Chronic venous hypertension (idiopathic) with ulcer of left lower extremity: Secondary | ICD-10-CM | POA: Diagnosis not present

## 2017-12-05 DIAGNOSIS — L03115 Cellulitis of right lower limb: Secondary | ICD-10-CM | POA: Diagnosis not present

## 2017-12-05 DIAGNOSIS — L97511 Non-pressure chronic ulcer of other part of right 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: Secondary | ICD-10-CM | POA: Diagnosis not present

## 2017-12-05 DIAGNOSIS — I87332 Chronic venous hypertension (idiopathic) with ulcer and inflammation of left lower extremity: Secondary | ICD-10-CM | POA: Diagnosis not present

## 2017-12-05 DIAGNOSIS — I10 Essential (primary) hypertension: Secondary | ICD-10-CM | POA: Diagnosis not present

## 2017-12-05 DIAGNOSIS — L97822 Non-pressure chronic ulcer of other part of left lower leg with fat layer exposed: Secondary | ICD-10-CM | POA: Diagnosis not present

## 2017-12-05 DIAGNOSIS — S91301A Unspecified open wound, right foot, initial encounter: Secondary | ICD-10-CM | POA: Diagnosis not present

## 2017-12-05 DIAGNOSIS — G82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, complete: Secondary | ICD-10-CM | POA: Diagnosis not present

## 2017-12-05 DIAGNOSIS — L97522 Non-pressure chronic ulcer of other part of left foot with fat layer exposed: Secondary | ICD-10-CM | POA: Diagnosis not present

## 2017-12-05 DIAGNOSIS — L89320 Pressure ulcer of left buttock, unstageable: Secondary | ICD-10-CM | POA: Diagnosis not present

## 2017-12-05 DIAGNOSIS — S91302A Unspecified open wound, left foot, initial encounter: Secondary | ICD-10-CM | POA: Diagnosis not present

## 2017-12-06 DIAGNOSIS — L97922 Non-pressure chronic ulcer of unspecified part of left lower leg with fat layer exposed: Secondary | ICD-10-CM | POA: Diagnosis not present

## 2017-12-07 DIAGNOSIS — F331 Major depressive disorder, recurrent, moderate: Secondary | ICD-10-CM | POA: Diagnosis not present

## 2017-12-08 DIAGNOSIS — L89320 Pressure ulcer of left buttock, unstageable: Secondary | ICD-10-CM | POA: Diagnosis not present

## 2017-12-08 DIAGNOSIS — L03115 Cellulitis of right lower limb: Secondary | ICD-10-CM | POA: Diagnosis not present

## 2017-12-08 DIAGNOSIS — B9562 Methicillin resistant Staphylococcus aureus infection as the cause of diseases classified elsewhere: Secondary | ICD-10-CM | POA: Diagnosis not present

## 2017-12-08 DIAGNOSIS — N3020 Other chronic cystitis without hematuria: Secondary | ICD-10-CM | POA: Diagnosis not present

## 2017-12-08 DIAGNOSIS — B9620 Unspecified Escherichia coli [E. coli] as the cause of diseases classified elsewhere: Secondary | ICD-10-CM | POA: Diagnosis not present

## 2017-12-08 DIAGNOSIS — N319 Neuromuscular dysfunction of bladder, unspecified: Secondary | ICD-10-CM | POA: Diagnosis not present

## 2017-12-08 DIAGNOSIS — L97222 Non-pressure chronic ulcer of left calf with fat layer exposed: Secondary | ICD-10-CM | POA: Diagnosis not present

## 2017-12-08 DIAGNOSIS — G82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, complete: Secondary | ICD-10-CM | POA: Diagnosis not present

## 2017-12-08 DIAGNOSIS — I10 Essential (primary) hypertension: Secondary | ICD-10-CM | POA: Diagnosis not present

## 2017-12-08 DIAGNOSIS — L97522 Non-pressure chronic ulcer of other part of left foot with fat layer exposed: Secondary | ICD-10-CM | POA: Diagnosis not present

## 2017-12-08 DIAGNOSIS — L97511 Non-pressure chronic ulcer of other part of right 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: Secondary | ICD-10-CM | POA: Diagnosis not present

## 2017-12-08 DIAGNOSIS — L97512 Non-pressure chronic ulcer of other part of right foot with fat layer exposed: Secondary | ICD-10-CM | POA: Diagnosis not present

## 2017-12-08 DIAGNOSIS — N390 Urinary tract infection, site not specified: Secondary | ICD-10-CM | POA: Diagnosis not present

## 2017-12-08 DIAGNOSIS — L97822 Non-pressure chronic ulcer of other part of left lower leg with fat layer exposed: Secondary | ICD-10-CM | POA: Diagnosis not present

## 2017-12-08 DIAGNOSIS — I87332 Chronic venous hypertension (idiopathic) with ulcer and inflammation of left lower extremity: Secondary | ICD-10-CM | POA: Diagnosis not present

## 2017-12-08 DIAGNOSIS — S31829A Unspecified open wound of left buttock, initial encounter: Secondary | ICD-10-CM | POA: Diagnosis not present

## 2017-12-08 DIAGNOSIS — F331 Major depressive disorder, recurrent, moderate: Secondary | ICD-10-CM | POA: Diagnosis not present

## 2017-12-15 DIAGNOSIS — B9562 Methicillin resistant Staphylococcus aureus infection as the cause of diseases classified elsewhere: Secondary | ICD-10-CM | POA: Diagnosis not present

## 2017-12-15 DIAGNOSIS — L97822 Non-pressure chronic ulcer of other part of left lower leg with fat layer exposed: Secondary | ICD-10-CM | POA: Diagnosis not present

## 2017-12-15 DIAGNOSIS — L03115 Cellulitis of right lower limb: Secondary | ICD-10-CM | POA: Diagnosis not present

## 2017-12-15 DIAGNOSIS — L97522 Non-pressure chronic ulcer of other part of left foot with fat layer exposed: Secondary | ICD-10-CM | POA: Diagnosis not present

## 2017-12-15 DIAGNOSIS — G82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, complete: Secondary | ICD-10-CM | POA: Diagnosis not present

## 2017-12-15 DIAGNOSIS — I10 Essential (primary) hypertension: Secondary | ICD-10-CM | POA: Diagnosis not present

## 2017-12-15 DIAGNOSIS — I87332 Chronic venous hypertension (idiopathic) with ulcer and inflammation of left lower extremity: Secondary | ICD-10-CM | POA: Diagnosis not present

## 2017-12-15 DIAGNOSIS — I87312 Chronic venous hypertension (idiopathic) with ulcer of left lower extremity: Secondary | ICD-10-CM | POA: Diagnosis not present

## 2017-12-15 DIAGNOSIS — L89320 Pressure ulcer of left buttock, unstageable: Secondary | ICD-10-CM | POA: Diagnosis not present

## 2017-12-15 DIAGNOSIS — L97511 Non-pressure chronic ulcer of other part of right 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: Secondary | ICD-10-CM | POA: Diagnosis not present

## 2017-12-19 ENCOUNTER — Telehealth: Payer: Self-pay | Admitting: Neurology

## 2017-12-19 NOTE — Telephone Encounter (Signed)
Pt called he said the muscle spasms in bil legs is getting worse. Pt said he has not seen a neurologist in 3-4 yrs for this condition, he has been seeing his PCP. Pt has been taking baclofen for this. Pt feels he needs to see neurologist. An appt has been scheduled for 4/10 with Dr D. FYI

## 2017-12-22 ENCOUNTER — Other Ambulatory Visit (HOSPITAL_COMMUNITY)
Admission: RE | Admit: 2017-12-22 | Discharge: 2017-12-22 | Disposition: A | Discharge status: Home / Self Care | Payer: BLUE CROSS/BLUE SHIELD | Source: Other Acute Inpatient Hospital | Attending: Internal Medicine | Admitting: Internal Medicine

## 2017-12-22 DIAGNOSIS — B9562 Methicillin resistant Staphylococcus aureus infection as the cause of diseases classified elsewhere: Secondary | ICD-10-CM | POA: Diagnosis not present

## 2017-12-22 DIAGNOSIS — L03115 Cellulitis of right lower limb: Secondary | ICD-10-CM | POA: Diagnosis not present

## 2017-12-22 DIAGNOSIS — L89320 Pressure ulcer of left buttock, unstageable: Secondary | ICD-10-CM | POA: Diagnosis not present

## 2017-12-22 DIAGNOSIS — G82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, complete: Secondary | ICD-10-CM | POA: Diagnosis not present

## 2017-12-22 DIAGNOSIS — I87332 Chronic venous hypertension (idiopathic) with ulcer and inflammation of left lower extremity: Secondary | ICD-10-CM | POA: Diagnosis not present

## 2017-12-22 DIAGNOSIS — L97822 Non-pressure chronic ulcer of other part of left lower leg with fat layer exposed: Secondary | ICD-10-CM | POA: Diagnosis not present

## 2017-12-22 DIAGNOSIS — I10 Essential (primary) hypertension: Secondary | ICD-10-CM | POA: Diagnosis not present

## 2017-12-22 DIAGNOSIS — L97511 Non-pressure chronic ulcer of other part of right 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: Secondary | ICD-10-CM | POA: Insufficient documentation

## 2017-12-22 DIAGNOSIS — L97522 Non-pressure chronic ulcer of other part of left foot with fat layer exposed: Secondary | ICD-10-CM | POA: Diagnosis not present

## 2017-12-25 LAB — AEROBIC CULTURE W GRAM STAIN (SUPERFICIAL SPECIMEN)

## 2017-12-27 ENCOUNTER — Other Ambulatory Visit: Payer: Self-pay | Admitting: Internal Medicine

## 2017-12-27 ENCOUNTER — Ambulatory Visit (HOSPITAL_COMMUNITY)
Admission: RE | Admit: 2017-12-27 | Discharge: 2017-12-27 | Disposition: A | Discharge status: Home / Self Care | Payer: BLUE CROSS/BLUE SHIELD | Source: Ambulatory Visit | Attending: Internal Medicine | Admitting: Internal Medicine

## 2017-12-27 ENCOUNTER — Encounter (HOSPITAL_BASED_OUTPATIENT_CLINIC_OR_DEPARTMENT_OTHER): Payer: BLUE CROSS/BLUE SHIELD | Attending: Internal Medicine

## 2017-12-27 DIAGNOSIS — G82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, complete: Secondary | ICD-10-CM | POA: Insufficient documentation

## 2017-12-27 DIAGNOSIS — M7989 Other specified soft tissue disorders: Secondary | ICD-10-CM | POA: Insufficient documentation

## 2017-12-27 DIAGNOSIS — L89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, stage 3: Secondary | ICD-10-CM | POA: Diagnosis not present

## 2017-12-27 DIAGNOSIS — G4730 Sleep apnea, unspecified: Secondary | ICD-10-CM | POA: Diagnosis not present

## 2017-12-27 DIAGNOSIS — I10 Essential (primary) hypertension: Secondary | ICD-10-CM | POA: Diagnosis not present

## 2017-12-27 DIAGNOSIS — L97822 Non-pressure chronic ulcer of other part of left lower leg with fat layer exposed: Secondary | ICD-10-CM | POA: Diagnosis not present

## 2017-12-27 DIAGNOSIS — I87332 Chronic venous hypertension (idiopathic) with ulcer and inflammation of left lower extremity: Secondary | ICD-10-CM | POA: Diagnosis not present

## 2017-12-27 DIAGNOSIS — S81802A Unspecified open wound, left lower leg, initial encounter: Secondary | ICD-10-CM | POA: Diagnosis not present

## 2017-12-27 DIAGNOSIS — I87312 Chronic venous hypertension (idiopathic) with ulcer of left lower extremity: Secondary | ICD-10-CM | POA: Diagnosis not present

## 2017-12-27 DIAGNOSIS — L97522 Non-pressure chronic ulcer of other part of left foot with fat layer exposed: Secondary | ICD-10-CM | POA: Diagnosis not present

## 2017-12-27 DIAGNOSIS — I890 Lymphedema, not elsewhere classified: Secondary | ICD-10-CM | POA: Insufficient documentation

## 2017-12-27 DIAGNOSIS — L97511 Non-pressure chronic ulcer of other part of right 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: Secondary | ICD-10-CM | POA: Diagnosis not present

## 2017-12-27 DIAGNOSIS — L98499 Non-pressure chronic ulcer of skin of other sites with unspecified severity: Secondary | ICD-10-CM

## 2017-12-27 DIAGNOSIS — L97512 Non-pressure chronic ulcer of other part of right foot with fat layer exposed: Secondary | ICD-10-CM | POA: Diagnosis not present

## 2017-12-27 DIAGNOSIS — S91101A Unspecified open wound of right great toe without damage to nail, initial encounter: Secondary | ICD-10-CM | POA: Diagnosis not present

## 2017-12-27 DIAGNOSIS — S91302A Unspecified open wound, left foot, initial encounter: Secondary | ICD-10-CM | POA: Diagnosis not present

## 2018-01-02 DIAGNOSIS — Z6841 Body Mass Index (BMI) 40.0 and over, adult: Secondary | ICD-10-CM | POA: Diagnosis not present

## 2018-01-02 DIAGNOSIS — E291 Testicular hypofunction: Secondary | ICD-10-CM | POA: Diagnosis not present

## 2018-01-02 DIAGNOSIS — I10 Essential (primary) hypertension: Secondary | ICD-10-CM | POA: Diagnosis not present

## 2018-01-02 DIAGNOSIS — R635 Abnormal weight gain: Secondary | ICD-10-CM | POA: Diagnosis not present

## 2018-01-03 DIAGNOSIS — L97522 Non-pressure chronic ulcer of other part of left foot with fat layer exposed: Secondary | ICD-10-CM | POA: Diagnosis not present

## 2018-01-03 DIAGNOSIS — I87312 Chronic venous hypertension (idiopathic) with ulcer of left lower extremity: Secondary | ICD-10-CM | POA: Diagnosis not present

## 2018-01-03 DIAGNOSIS — L89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, stage 3: Secondary | ICD-10-CM | POA: Diagnosis not present

## 2018-01-03 DIAGNOSIS — L97222 Non-pressure chronic ulcer of left calf with fat layer exposed: Secondary | ICD-10-CM | POA: Diagnosis not present

## 2018-01-03 DIAGNOSIS — G4730 Sleep apnea, unspecified: Secondary | ICD-10-CM | POA: Diagnosis not present

## 2018-01-03 DIAGNOSIS — I890 Lymphedema, not elsewhere classified: Secondary | ICD-10-CM | POA: Diagnosis not present

## 2018-01-03 DIAGNOSIS — L97512 Non-pressure chronic ulcer of other part of right foot with fat layer exposed: Secondary | ICD-10-CM | POA: Diagnosis not present

## 2018-01-03 DIAGNOSIS — I10 Essential (primary) hypertension: Secondary | ICD-10-CM | POA: Diagnosis not present

## 2018-01-03 DIAGNOSIS — G82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, complete: Secondary | ICD-10-CM | POA: Diagnosis not present

## 2018-01-03 DIAGNOSIS — L97822 Non-pressure chronic ulcer of other part of left lower leg with fat layer exposed: Secondary | ICD-10-CM | POA: Diagnosis not present

## 2018-01-03 DIAGNOSIS — I87332 Chronic venous hypertension (idiopathic) with ulcer and inflammation of left lower extremity: Secondary | ICD-10-CM | POA: Diagnosis not present

## 2018-01-10 DIAGNOSIS — I87332 Chronic venous hypertension (idiopathic) with ulcer and inflammation of left lower extremity: Secondary | ICD-10-CM | POA: Diagnosis not present

## 2018-01-10 DIAGNOSIS — L97522 Non-pressure chronic ulcer of other part of left foot with fat layer exposed: Secondary | ICD-10-CM | POA: Diagnosis not present

## 2018-01-10 DIAGNOSIS — L89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, stage 3: Secondary | ICD-10-CM | POA: Diagnosis not present

## 2018-01-10 DIAGNOSIS — I890 Lymphedema, not elsewhere classified: Secondary | ICD-10-CM | POA: Diagnosis not present

## 2018-01-10 DIAGNOSIS — L97222 Non-pressure chronic ulcer of left calf with fat layer exposed: Secondary | ICD-10-CM | POA: Diagnosis not present

## 2018-01-10 DIAGNOSIS — L97822 Non-pressure chronic ulcer of other part of left lower leg with fat layer exposed: Secondary | ICD-10-CM | POA: Diagnosis not present

## 2018-01-10 DIAGNOSIS — I87312 Chronic venous hypertension (idiopathic) with ulcer of left lower extremity: Secondary | ICD-10-CM | POA: Diagnosis not present

## 2018-01-10 DIAGNOSIS — I10 Essential (primary) hypertension: Secondary | ICD-10-CM | POA: Diagnosis not present

## 2018-01-10 DIAGNOSIS — L97512 Non-pressure chronic ulcer of other part of right foot with fat layer exposed: Secondary | ICD-10-CM | POA: Diagnosis not present

## 2018-01-10 DIAGNOSIS — G82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, complete: Secondary | ICD-10-CM | POA: Diagnosis not present

## 2018-01-10 DIAGNOSIS — G4730 Sleep apnea, unspecified: Secondary | ICD-10-CM | POA: Diagnosis not present

## 2018-01-17 DIAGNOSIS — L89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, stage 3: Secondary | ICD-10-CM | POA: Diagnosis not present

## 2018-01-17 DIAGNOSIS — L97522 Non-pressure chronic ulcer of other part of left foot with fat layer exposed: Secondary | ICD-10-CM | POA: Diagnosis not present

## 2018-01-17 DIAGNOSIS — S81802A Unspecified open wound, left lower leg, initial encounter: Secondary | ICD-10-CM | POA: Diagnosis not present

## 2018-01-17 DIAGNOSIS — I10 Essential (primary) hypertension: Secondary | ICD-10-CM | POA: Diagnosis not present

## 2018-01-17 DIAGNOSIS — G82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, complete: Secondary | ICD-10-CM | POA: Diagnosis not present

## 2018-01-17 DIAGNOSIS — I87332 Chronic venous hypertension (idiopathic) with ulcer and inflammation of left lower extremity: Secondary | ICD-10-CM | POA: Diagnosis not present

## 2018-01-17 DIAGNOSIS — I890 Lymphedema, not elsewhere classified: Secondary | ICD-10-CM | POA: Diagnosis not present

## 2018-01-17 DIAGNOSIS — S91301A Unspecified open wound, right foot, initial encounter: Secondary | ICD-10-CM | POA: Diagnosis not present

## 2018-01-17 DIAGNOSIS — S91302A Unspecified open wound, left foot, initial encounter: Secondary | ICD-10-CM | POA: Diagnosis not present

## 2018-01-17 DIAGNOSIS — G4730 Sleep apnea, unspecified: Secondary | ICD-10-CM | POA: Diagnosis not present

## 2018-01-17 DIAGNOSIS — S31829A Unspecified open wound of left buttock, initial encounter: Secondary | ICD-10-CM | POA: Diagnosis not present

## 2018-01-17 DIAGNOSIS — L97822 Non-pressure chronic ulcer of other part of left lower leg with fat layer exposed: Secondary | ICD-10-CM | POA: Diagnosis not present

## 2018-01-17 DIAGNOSIS — L97512 Non-pressure chronic ulcer of other part of right foot with fat layer exposed: Secondary | ICD-10-CM | POA: Diagnosis not present

## 2018-01-21 DIAGNOSIS — J069 Acute upper respiratory infection, unspecified: Secondary | ICD-10-CM | POA: Diagnosis not present

## 2018-01-22 DIAGNOSIS — E871 Hypo-osmolality and hyponatremia: Secondary | ICD-10-CM | POA: Diagnosis not present

## 2018-01-22 DIAGNOSIS — T83511A Infection and inflammatory reaction due to indwelling urethral catheter, initial encounter: Secondary | ICD-10-CM | POA: Diagnosis not present

## 2018-01-22 DIAGNOSIS — A419 Sepsis, unspecified organism: Secondary | ICD-10-CM | POA: Diagnosis not present

## 2018-01-22 DIAGNOSIS — N390 Urinary tract infection, site not specified: Secondary | ICD-10-CM | POA: Diagnosis not present

## 2018-01-22 DIAGNOSIS — R748 Abnormal levels of other serum enzymes: Secondary | ICD-10-CM | POA: Diagnosis not present

## 2018-01-22 DIAGNOSIS — R509 Fever, unspecified: Secondary | ICD-10-CM | POA: Diagnosis not present

## 2018-01-22 DIAGNOSIS — S24104S Unspecified injury at T11-T12 level of thoracic spinal cord, sequela: Secondary | ICD-10-CM | POA: Diagnosis not present

## 2018-01-22 DIAGNOSIS — G8220 Paraplegia, unspecified: Secondary | ICD-10-CM | POA: Diagnosis not present

## 2018-01-22 DIAGNOSIS — N12 Tubulo-interstitial nephritis, not specified as acute or chronic: Secondary | ICD-10-CM | POA: Diagnosis not present

## 2018-01-22 DIAGNOSIS — E876 Hypokalemia: Secondary | ICD-10-CM | POA: Diagnosis not present

## 2018-01-22 DIAGNOSIS — L97929 Non-pressure chronic ulcer of unspecified part of left lower leg with unspecified severity: Secondary | ICD-10-CM | POA: Diagnosis not present

## 2018-01-22 DIAGNOSIS — L97919 Non-pressure chronic ulcer of unspecified part of right lower leg with unspecified severity: Secondary | ICD-10-CM | POA: Diagnosis not present

## 2018-01-22 DIAGNOSIS — N319 Neuromuscular dysfunction of bladder, unspecified: Secondary | ICD-10-CM | POA: Diagnosis not present

## 2018-01-22 DIAGNOSIS — G4733 Obstructive sleep apnea (adult) (pediatric): Secondary | ICD-10-CM | POA: Diagnosis not present

## 2018-01-22 DIAGNOSIS — I959 Hypotension, unspecified: Secondary | ICD-10-CM | POA: Diagnosis not present

## 2018-01-22 DIAGNOSIS — R7989 Other specified abnormal findings of blood chemistry: Secondary | ICD-10-CM | POA: Diagnosis not present

## 2018-01-22 DIAGNOSIS — Z452 Encounter for adjustment and management of vascular access device: Secondary | ICD-10-CM | POA: Diagnosis not present

## 2018-01-22 DIAGNOSIS — Z8659 Personal history of other mental and behavioral disorders: Secondary | ICD-10-CM | POA: Diagnosis not present

## 2018-01-22 DIAGNOSIS — B9689 Other specified bacterial agents as the cause of diseases classified elsewhere: Secondary | ICD-10-CM | POA: Diagnosis not present

## 2018-01-22 DIAGNOSIS — I10 Essential (primary) hypertension: Secondary | ICD-10-CM | POA: Diagnosis not present

## 2018-01-22 DIAGNOSIS — N159 Renal tubulo-interstitial disease, unspecified: Secondary | ICD-10-CM | POA: Diagnosis not present

## 2018-01-23 DIAGNOSIS — G8220 Paraplegia, unspecified: Secondary | ICD-10-CM | POA: Diagnosis not present

## 2018-01-23 DIAGNOSIS — N12 Tubulo-interstitial nephritis, not specified as acute or chronic: Secondary | ICD-10-CM | POA: Diagnosis not present

## 2018-01-23 DIAGNOSIS — A419 Sepsis, unspecified organism: Secondary | ICD-10-CM | POA: Diagnosis not present

## 2018-01-23 DIAGNOSIS — E871 Hypo-osmolality and hyponatremia: Secondary | ICD-10-CM | POA: Diagnosis not present

## 2018-01-24 ENCOUNTER — Encounter (HOSPITAL_BASED_OUTPATIENT_CLINIC_OR_DEPARTMENT_OTHER): Payer: BLUE CROSS/BLUE SHIELD | Attending: Internal Medicine

## 2018-01-24 DIAGNOSIS — E871 Hypo-osmolality and hyponatremia: Secondary | ICD-10-CM | POA: Diagnosis not present

## 2018-01-24 DIAGNOSIS — L97512 Non-pressure chronic ulcer of other part of right foot with fat layer exposed: Secondary | ICD-10-CM | POA: Insufficient documentation

## 2018-01-24 DIAGNOSIS — A419 Sepsis, unspecified organism: Secondary | ICD-10-CM | POA: Diagnosis not present

## 2018-01-24 DIAGNOSIS — G4730 Sleep apnea, unspecified: Secondary | ICD-10-CM | POA: Insufficient documentation

## 2018-01-24 DIAGNOSIS — Z87891 Personal history of nicotine dependence: Secondary | ICD-10-CM | POA: Insufficient documentation

## 2018-01-24 DIAGNOSIS — L89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, stage 3: Secondary | ICD-10-CM | POA: Insufficient documentation

## 2018-01-24 DIAGNOSIS — L89622 Pressure ulcer of left heel, stage 2: Secondary | ICD-10-CM | POA: Insufficient documentation

## 2018-01-24 DIAGNOSIS — L97529 Non-pressure chronic ulcer of other part of left foot with unspecified severity: Secondary | ICD-10-CM | POA: Insufficient documentation

## 2018-01-24 DIAGNOSIS — I890 Lymphedema, not elsewhere classified: Secondary | ICD-10-CM | POA: Insufficient documentation

## 2018-01-24 DIAGNOSIS — L97822 Non-pressure chronic ulcer of other part of left lower leg with fat layer exposed: Secondary | ICD-10-CM | POA: Insufficient documentation

## 2018-01-24 DIAGNOSIS — I87332 Chronic venous hypertension (idiopathic) with ulcer and inflammation of left lower extremity: Secondary | ICD-10-CM | POA: Insufficient documentation

## 2018-01-24 DIAGNOSIS — G82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, complete: Secondary | ICD-10-CM | POA: Insufficient documentation

## 2018-01-24 DIAGNOSIS — G8220 Paraplegia, unspecified: Secondary | ICD-10-CM | POA: Diagnosis not present

## 2018-01-24 DIAGNOSIS — I10 Essential (primary) hypertension: Secondary | ICD-10-CM | POA: Insufficient documentation

## 2018-01-24 DIAGNOSIS — N12 Tubulo-interstitial nephritis, not specified as acute or chronic: Secondary | ICD-10-CM | POA: Diagnosis not present

## 2018-01-25 ENCOUNTER — Ambulatory Visit: Payer: BLUE CROSS/BLUE SHIELD | Admitting: Neurology

## 2018-01-25 DIAGNOSIS — G8220 Paraplegia, unspecified: Secondary | ICD-10-CM | POA: Diagnosis not present

## 2018-01-25 DIAGNOSIS — A419 Sepsis, unspecified organism: Secondary | ICD-10-CM | POA: Diagnosis not present

## 2018-01-25 DIAGNOSIS — E871 Hypo-osmolality and hyponatremia: Secondary | ICD-10-CM | POA: Diagnosis not present

## 2018-01-25 DIAGNOSIS — N12 Tubulo-interstitial nephritis, not specified as acute or chronic: Secondary | ICD-10-CM | POA: Diagnosis not present

## 2018-01-26 DIAGNOSIS — A419 Sepsis, unspecified organism: Secondary | ICD-10-CM | POA: Diagnosis not present

## 2018-01-26 DIAGNOSIS — G8220 Paraplegia, unspecified: Secondary | ICD-10-CM | POA: Diagnosis not present

## 2018-01-26 DIAGNOSIS — Z452 Encounter for adjustment and management of vascular access device: Secondary | ICD-10-CM | POA: Diagnosis not present

## 2018-01-26 DIAGNOSIS — E871 Hypo-osmolality and hyponatremia: Secondary | ICD-10-CM | POA: Diagnosis not present

## 2018-01-26 DIAGNOSIS — E291 Testicular hypofunction: Secondary | ICD-10-CM | POA: Diagnosis not present

## 2018-01-26 DIAGNOSIS — N12 Tubulo-interstitial nephritis, not specified as acute or chronic: Secondary | ICD-10-CM | POA: Diagnosis not present

## 2018-01-26 DIAGNOSIS — Z6835 Body mass index (BMI) 35.0-35.9, adult: Secondary | ICD-10-CM | POA: Diagnosis not present

## 2018-01-26 DIAGNOSIS — N390 Urinary tract infection, site not specified: Secondary | ICD-10-CM | POA: Diagnosis not present

## 2018-01-26 DIAGNOSIS — I10 Essential (primary) hypertension: Secondary | ICD-10-CM | POA: Diagnosis not present

## 2018-01-26 DIAGNOSIS — R635 Abnormal weight gain: Secondary | ICD-10-CM | POA: Diagnosis not present

## 2018-01-30 DIAGNOSIS — E291 Testicular hypofunction: Secondary | ICD-10-CM | POA: Diagnosis not present

## 2018-01-30 DIAGNOSIS — R635 Abnormal weight gain: Secondary | ICD-10-CM | POA: Diagnosis not present

## 2018-01-30 DIAGNOSIS — I10 Essential (primary) hypertension: Secondary | ICD-10-CM | POA: Diagnosis not present

## 2018-01-30 DIAGNOSIS — Z6835 Body mass index (BMI) 35.0-35.9, adult: Secondary | ICD-10-CM | POA: Diagnosis not present

## 2018-01-31 DIAGNOSIS — G82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, complete: Secondary | ICD-10-CM | POA: Diagnosis not present

## 2018-01-31 DIAGNOSIS — L89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, stage 3: Secondary | ICD-10-CM | POA: Diagnosis not present

## 2018-01-31 DIAGNOSIS — I10 Essential (primary) hypertension: Secondary | ICD-10-CM | POA: Diagnosis not present

## 2018-01-31 DIAGNOSIS — L97522 Non-pressure chronic ulcer of other part of left foot with fat layer exposed: Secondary | ICD-10-CM | POA: Diagnosis not present

## 2018-01-31 DIAGNOSIS — I872 Venous insufficiency (chronic) (peripheral): Secondary | ICD-10-CM | POA: Diagnosis not present

## 2018-01-31 DIAGNOSIS — L97822 Non-pressure chronic ulcer of other part of left lower leg with fat layer exposed: Secondary | ICD-10-CM | POA: Diagnosis not present

## 2018-01-31 DIAGNOSIS — N390 Urinary tract infection, site not specified: Secondary | ICD-10-CM | POA: Diagnosis not present

## 2018-01-31 DIAGNOSIS — G8220 Paraplegia, unspecified: Secondary | ICD-10-CM | POA: Diagnosis not present

## 2018-01-31 DIAGNOSIS — G4730 Sleep apnea, unspecified: Secondary | ICD-10-CM | POA: Diagnosis not present

## 2018-01-31 DIAGNOSIS — L97512 Non-pressure chronic ulcer of other part of right foot with fat layer exposed: Secondary | ICD-10-CM | POA: Diagnosis not present

## 2018-01-31 DIAGNOSIS — I87332 Chronic venous hypertension (idiopathic) with ulcer and inflammation of left lower extremity: Secondary | ICD-10-CM | POA: Diagnosis not present

## 2018-01-31 DIAGNOSIS — L89622 Pressure ulcer of left heel, stage 2: Secondary | ICD-10-CM | POA: Diagnosis not present

## 2018-01-31 DIAGNOSIS — L97529 Non-pressure chronic ulcer of other part of left foot with unspecified severity: Secondary | ICD-10-CM | POA: Diagnosis not present

## 2018-01-31 DIAGNOSIS — R945 Abnormal results of liver function studies: Secondary | ICD-10-CM | POA: Diagnosis not present

## 2018-01-31 DIAGNOSIS — Z87891 Personal history of nicotine dependence: Secondary | ICD-10-CM | POA: Diagnosis not present

## 2018-01-31 DIAGNOSIS — I890 Lymphedema, not elsewhere classified: Secondary | ICD-10-CM | POA: Diagnosis not present
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
## 2018-02-01 ENCOUNTER — Ambulatory Visit: Payer: BLUE CROSS/BLUE SHIELD | Admitting: Infectious Diseases

## 2018-02-01 ENCOUNTER — Telehealth: Payer: Self-pay | Admitting: *Deleted

## 2018-02-01 ENCOUNTER — Ambulatory Visit (INDEPENDENT_AMBULATORY_CARE_PROVIDER_SITE_OTHER): Payer: BLUE CROSS/BLUE SHIELD | Admitting: Internal Medicine

## 2018-02-01 ENCOUNTER — Encounter: Payer: Self-pay | Admitting: Internal Medicine

## 2018-02-01 ENCOUNTER — Ambulatory Visit: Payer: BLUE CROSS/BLUE SHIELD | Admitting: Nurse Practitioner

## 2018-02-01 VITALS — Temp 97.6°F | Wt 245.0 lb

## 2018-02-01 DIAGNOSIS — Z87440 Personal history of urinary (tract) infections: Secondary | ICD-10-CM | POA: Diagnosis not present

## 2018-02-01 DIAGNOSIS — N319 Neuromuscular dysfunction of bladder, unspecified: Secondary | ICD-10-CM

## 2018-02-01 DIAGNOSIS — R350 Frequency of micturition: Secondary | ICD-10-CM | POA: Diagnosis not present

## 2018-02-01 NOTE — Telephone Encounter (Signed)
Received notes from [place] (included discharge summary and notes from [facility], as well as labs).  Patient is being referred for UTI/Sepsis. His last day of IV antibiotics is 3/13 (noted as Cefepime per Dr Henreitta Leber recommendation in [place] notes).  Patient is under care of [facility] for his chronic lower extremity wounds. Notes in Dr Storm Frisk pod.  Landis Gandy, RN
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
## 2018-02-02 ENCOUNTER — Telehealth: Payer: Self-pay | Admitting: Neurology

## 2018-02-02 LAB — URINALYSIS, ROUTINE W REFLEX MICROSCOPIC
Bilirubin Urine: NEGATIVE
Glucose, UA: NEGATIVE
Hgb urine dipstick: NEGATIVE
Ketones, ur: NEGATIVE
Leukocytes, UA: NEGATIVE
Nitrite: NEGATIVE
Protein, ur: NEGATIVE
Specific Gravity, Urine: 1.009 (ref 1.001–1.03)
pH: 7 (ref 5.0–8.0)

## 2018-02-02 LAB — URINE CULTURE
MICRO NUMBER:: 90319669
Result:: NO GROWTH
SPECIMEN QUALITY:: ADEQUATE

## 2018-02-02 NOTE — Telephone Encounter (Signed)
I have called the patient, he has developed a new wound on his bottom which is causing him to have to sleep on his side. The patient states the current mask doesn't work as well for him to sleep on his side. I have advised him to go to [facility] or reach out to them to see if they can assist with helping him with his mask. Pt verbalized understanding. Pt had no questions at this time but was encouraged to call back if questions arise.

## 2018-02-02 NOTE — Telephone Encounter (Signed)
Pt called he is having difficulty sleeping on his side with the current mask. Pt said it was good while he slept on his back but now he is having to sleep on his. Please call to advise

## 2018-02-03 NOTE — Telephone Encounter (Signed)
Can you call [name] to let him know that his urine culture and urine analysis is clean. Please arrange for home health to pull his picc line. Encourage good oral intake

## 2018-02-06 NOTE — Telephone Encounter (Signed)
Spoke with patient, relayed results. He states he still feels "not quite right" but thinks it might be a result of still feeling tired/down from his hospitalization.  Per patient, he uses [facility] for pharmacy/nursing. Spoke with Anderson Malta, gave verbal order per Dr Baxter Flattery to pull PICC.  Will send written order to pharmacy. Phone: [phone] Fax: [phone]

## 2018-02-07 DIAGNOSIS — G82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, complete: Secondary | ICD-10-CM | POA: Diagnosis not present

## 2018-02-07 DIAGNOSIS — G4730 Sleep apnea, unspecified: Secondary | ICD-10-CM | POA: Diagnosis not present

## 2018-02-07 DIAGNOSIS — L97822 Non-pressure chronic ulcer of other part of left lower leg with fat layer exposed: Secondary | ICD-10-CM | POA: Diagnosis not present

## 2018-02-07 DIAGNOSIS — I10 Essential (primary) hypertension: Secondary | ICD-10-CM | POA: Diagnosis not present

## 2018-02-07 DIAGNOSIS — L97512 Non-pressure chronic ulcer of other part of right foot with fat layer exposed: Secondary | ICD-10-CM | POA: Diagnosis not present

## 2018-02-07 DIAGNOSIS — L89622 Pressure ulcer of left heel, stage 2: Secondary | ICD-10-CM | POA: Diagnosis not present

## 2018-02-07 DIAGNOSIS — I87332 Chronic venous hypertension (idiopathic) with ulcer and inflammation of left lower extremity: Secondary | ICD-10-CM | POA: Diagnosis not present

## 2018-02-07 DIAGNOSIS — Z87891 Personal history of nicotine dependence: Secondary | ICD-10-CM | POA: Diagnosis not present

## 2018-02-07 DIAGNOSIS — L89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, stage 3: Secondary | ICD-10-CM | POA: Diagnosis not present

## 2018-02-07 DIAGNOSIS — I890 Lymphedema, not elsewhere classified: Secondary | ICD-10-CM | POA: Diagnosis not present

## 2018-02-07 DIAGNOSIS — I87312 Chronic venous hypertension (idiopathic) with ulcer of left lower extremity: Secondary | ICD-10-CM | POA: Diagnosis not present

## 2018-02-07 DIAGNOSIS — L97529 Non-pressure chronic ulcer of other part of left foot with unspecified severity: Secondary | ICD-10-CM | POA: Diagnosis not present

## 2018-02-14 DIAGNOSIS — L89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, stage 3: Secondary | ICD-10-CM | POA: Diagnosis not present

## 2018-02-14 DIAGNOSIS — G82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, complete: Secondary | ICD-10-CM | POA: Diagnosis not present

## 2018-02-14 DIAGNOSIS — I10 Essential (primary) hypertension: Secondary | ICD-10-CM | POA: Diagnosis not present

## 2018-02-14 DIAGNOSIS — L97822 Non-pressure chronic ulcer of other part of left lower leg with fat layer exposed: Secondary | ICD-10-CM | POA: Diagnosis not present

## 2018-02-14 DIAGNOSIS — Z87891 Personal history of nicotine dependence: Secondary | ICD-10-CM | POA: Diagnosis not present

## 2018-02-14 DIAGNOSIS — G4730 Sleep apnea, unspecified: Secondary | ICD-10-CM | POA: Diagnosis not present

## 2018-02-14 DIAGNOSIS — L97529 Non-pressure chronic ulcer of other part of left foot with unspecified severity: Secondary | ICD-10-CM | POA: Diagnosis not present

## 2018-02-14 DIAGNOSIS — I87332 Chronic venous hypertension (idiopathic) with ulcer and inflammation of left lower extremity: Secondary | ICD-10-CM | POA: Diagnosis not present

## 2018-02-14 DIAGNOSIS — I890 Lymphedema, not elsewhere classified: Secondary | ICD-10-CM | POA: Diagnosis not present

## 2018-02-14 DIAGNOSIS — L89622 Pressure ulcer of left heel, stage 2: Secondary | ICD-10-CM | POA: Diagnosis not present

## 2018-02-14 DIAGNOSIS — L97512 Non-pressure chronic ulcer of other part of right foot with fat layer exposed: Secondary | ICD-10-CM | POA: Diagnosis not present

## 2018-02-15 ENCOUNTER — Ambulatory Visit (INDEPENDENT_AMBULATORY_CARE_PROVIDER_SITE_OTHER): Payer: BLUE CROSS/BLUE SHIELD | Admitting: Internal Medicine

## 2018-02-15 ENCOUNTER — Encounter: Payer: Self-pay | Admitting: Internal Medicine

## 2018-02-15 VITALS — BP 110/72 | HR 112 | Temp 97.7°F

## 2018-02-15 DIAGNOSIS — N390 Urinary tract infection, site not specified: Secondary | ICD-10-CM | POA: Diagnosis not present

## 2018-02-15 DIAGNOSIS — N319 Neuromuscular dysfunction of bladder, unspecified: Secondary | ICD-10-CM | POA: Diagnosis not present
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
## 2018-02-21 ENCOUNTER — Encounter (HOSPITAL_BASED_OUTPATIENT_CLINIC_OR_DEPARTMENT_OTHER): Payer: BLUE CROSS/BLUE SHIELD | Attending: Internal Medicine

## 2018-02-21 DIAGNOSIS — L97512 Non-pressure chronic ulcer of other part of right foot with fat layer exposed: Secondary | ICD-10-CM | POA: Insufficient documentation

## 2018-02-21 DIAGNOSIS — G4730 Sleep apnea, unspecified: Secondary | ICD-10-CM | POA: Insufficient documentation

## 2018-02-21 DIAGNOSIS — L97822 Non-pressure chronic ulcer of other part of left lower leg with fat layer exposed: Secondary | ICD-10-CM | POA: Insufficient documentation

## 2018-02-21 DIAGNOSIS — L89623 Pressure ulcer of left heel, stage 3: Secondary | ICD-10-CM | POA: Insufficient documentation

## 2018-02-21 DIAGNOSIS — I87332 Chronic venous hypertension (idiopathic) with ulcer and inflammation of left lower extremity: Secondary | ICD-10-CM | POA: Diagnosis not present

## 2018-02-21 DIAGNOSIS — G82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, complete: Secondary | ICD-10-CM | POA: Insufficient documentation

## 2018-02-21 DIAGNOSIS — L97522 Non-pressure chronic ulcer of other part of left foot with fat layer exposed: Secondary | ICD-10-CM | POA: Diagnosis not present

## 2018-02-21 DIAGNOSIS — I10 Essential (primary) hypertension: Secondary | ICD-10-CM | POA: Insufficient documentation

## 2018-02-21 DIAGNOSIS — B353 Tinea pedis: Secondary | ICD-10-CM | POA: Insufficient documentation

## 2018-02-21 DIAGNOSIS — B368 Other specified superficial mycoses: Secondary | ICD-10-CM | POA: Diagnosis not present

## 2018-02-21 DIAGNOSIS — L89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, stage 3: Secondary | ICD-10-CM | POA: Diagnosis not present

## 2018-02-21 DIAGNOSIS — L89893 Pressure ulcer of other site, stage 3: Secondary | ICD-10-CM | POA: Diagnosis not present

## 2018-02-28 DIAGNOSIS — L97522 Non-pressure chronic ulcer of other part of left foot with fat layer exposed: Secondary | ICD-10-CM | POA: Diagnosis not present

## 2018-02-28 DIAGNOSIS — I87332 Chronic venous hypertension (idiopathic) with ulcer and inflammation of left lower extremity: Secondary | ICD-10-CM | POA: Diagnosis not present

## 2018-02-28 DIAGNOSIS — G82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, complete: Secondary | ICD-10-CM | POA: Diagnosis not present

## 2018-02-28 DIAGNOSIS — L97512 Non-pressure chronic ulcer of other part of right foot with fat layer exposed: Secondary | ICD-10-CM | POA: Diagnosis not present

## 2018-02-28 DIAGNOSIS — G4730 Sleep apnea, unspecified: Secondary | ICD-10-CM | POA: Diagnosis not present

## 2018-02-28 DIAGNOSIS — B353 Tinea pedis: Secondary | ICD-10-CM | POA: Diagnosis not present

## 2018-02-28 DIAGNOSIS — I10 Essential (primary) hypertension: Secondary | ICD-10-CM | POA: Diagnosis not present

## 2018-02-28 DIAGNOSIS — L89622 Pressure ulcer of left heel, stage 2: Secondary | ICD-10-CM | POA: Diagnosis not present

## 2018-02-28 DIAGNOSIS — L89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, stage 3: Secondary | ICD-10-CM | POA: Diagnosis not present

## 2018-02-28 DIAGNOSIS — L89623 Pressure ulcer of left heel, stage 3: Secondary | ICD-10-CM | POA: Diagnosis not present

## 2018-02-28 DIAGNOSIS — L97822 Non-pressure chronic ulcer of other part of left lower leg with fat layer exposed: Secondary | ICD-10-CM | POA: Diagnosis not present

## 2018-03-01 ENCOUNTER — Ambulatory Visit: Payer: BLUE CROSS/BLUE SHIELD | Admitting: Neurology

## 2018-03-02 DIAGNOSIS — N3942 Incontinence without sensory awareness: Secondary | ICD-10-CM | POA: Diagnosis not present

## 2018-03-02 DIAGNOSIS — N319 Neuromuscular dysfunction of bladder, unspecified: Secondary | ICD-10-CM | POA: Diagnosis not present

## 2018-03-02 DIAGNOSIS — F331 Major depressive disorder, recurrent, moderate: Secondary | ICD-10-CM | POA: Diagnosis not present

## 2018-03-02 DIAGNOSIS — L97922 Non-pressure chronic ulcer of unspecified part of left lower leg with fat layer exposed: Secondary | ICD-10-CM | POA: Diagnosis not present

## 2018-03-02 DIAGNOSIS — B9689 Other specified bacterial agents as the cause of diseases classified elsewhere: Secondary | ICD-10-CM | POA: Diagnosis not present

## 2018-03-02 DIAGNOSIS — N390 Urinary tract infection, site not specified: Secondary | ICD-10-CM | POA: Diagnosis not present

## 2018-03-03 DIAGNOSIS — Z6835 Body mass index (BMI) 35.0-35.9, adult: Secondary | ICD-10-CM | POA: Diagnosis not present

## 2018-03-03 DIAGNOSIS — R635 Abnormal weight gain: Secondary | ICD-10-CM | POA: Diagnosis not present

## 2018-03-03 DIAGNOSIS — I10 Essential (primary) hypertension: Secondary | ICD-10-CM | POA: Diagnosis not present

## 2018-03-03 DIAGNOSIS — E291 Testicular hypofunction: Secondary | ICD-10-CM | POA: Diagnosis not present

## 2018-03-08 DIAGNOSIS — L97222 Non-pressure chronic ulcer of left calf with fat layer exposed: Secondary | ICD-10-CM | POA: Diagnosis not present

## 2018-03-08 DIAGNOSIS — G82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, complete: Secondary | ICD-10-CM | POA: Diagnosis not present

## 2018-03-08 DIAGNOSIS — L97822 Non-pressure chronic ulcer of other part of left lower leg with fat layer exposed: Secondary | ICD-10-CM | POA: Diagnosis not present

## 2018-03-08 DIAGNOSIS — L97512 Non-pressure chronic ulcer of other part of right foot with fat layer exposed: Secondary | ICD-10-CM | POA: Diagnosis not present

## 2018-03-08 DIAGNOSIS — L89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, stage 3: Secondary | ICD-10-CM | POA: Diagnosis not present

## 2018-03-08 DIAGNOSIS — I87312 Chronic venous hypertension (idiopathic) with ulcer of left lower extremity: Secondary | ICD-10-CM | POA: Diagnosis not present

## 2018-03-08 DIAGNOSIS — L89623 Pressure ulcer of left heel, stage 3: Secondary | ICD-10-CM | POA: Diagnosis not present

## 2018-03-08 DIAGNOSIS — I10 Essential (primary) hypertension: Secondary | ICD-10-CM | POA: Diagnosis not present

## 2018-03-08 DIAGNOSIS — L97522 Non-pressure chronic ulcer of other part of left foot with fat layer exposed: Secondary | ICD-10-CM | POA: Diagnosis not present

## 2018-03-08 DIAGNOSIS — I87332 Chronic venous hypertension (idiopathic) with ulcer and inflammation of left lower extremity: Secondary | ICD-10-CM | POA: Diagnosis not present

## 2018-03-08 DIAGNOSIS — B353 Tinea pedis: Secondary | ICD-10-CM | POA: Diagnosis not present

## 2018-03-08 DIAGNOSIS — G4730 Sleep apnea, unspecified: Secondary | ICD-10-CM | POA: Diagnosis not present

## 2018-03-14 ENCOUNTER — Other Ambulatory Visit (HOSPITAL_COMMUNITY)
Admit: 2018-03-14 | Discharge: 2018-03-14 | Disposition: A | Discharge status: Home / Self Care | Payer: BLUE CROSS/BLUE SHIELD | Source: Ambulatory Visit | Attending: Internal Medicine | Admitting: Internal Medicine

## 2018-03-14 DIAGNOSIS — L03116 Cellulitis of left lower limb: Secondary | ICD-10-CM | POA: Diagnosis not present

## 2018-03-14 DIAGNOSIS — L97522 Non-pressure chronic ulcer of other part of left foot with fat layer exposed: Secondary | ICD-10-CM | POA: Diagnosis not present

## 2018-03-14 DIAGNOSIS — L97512 Non-pressure chronic ulcer of other part of right foot with fat layer exposed: Secondary | ICD-10-CM | POA: Diagnosis not present

## 2018-03-14 DIAGNOSIS — G82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, complete: Secondary | ICD-10-CM | POA: Diagnosis not present

## 2018-03-14 DIAGNOSIS — G4730 Sleep apnea, unspecified: Secondary | ICD-10-CM | POA: Diagnosis not present

## 2018-03-14 DIAGNOSIS — I10 Essential (primary) hypertension: Secondary | ICD-10-CM | POA: Diagnosis not present

## 2018-03-14 DIAGNOSIS — I87312 Chronic venous hypertension (idiopathic) with ulcer of left lower extremity: Secondary | ICD-10-CM | POA: Diagnosis not present

## 2018-03-14 DIAGNOSIS — L89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, stage 3: Secondary | ICD-10-CM | POA: Diagnosis not present

## 2018-03-14 DIAGNOSIS — I87332 Chronic venous hypertension (idiopathic) with ulcer and inflammation of left lower extremity: Secondary | ICD-10-CM | POA: Diagnosis not present

## 2018-03-14 DIAGNOSIS — L97222 Non-pressure chronic ulcer of left calf with fat layer exposed: Secondary | ICD-10-CM | POA: Diagnosis not present

## 2018-03-14 DIAGNOSIS — L89623 Pressure ulcer of left heel, stage 3: Secondary | ICD-10-CM | POA: Diagnosis not present

## 2018-03-14 DIAGNOSIS — B353 Tinea pedis: Secondary | ICD-10-CM | POA: Diagnosis not present

## 2018-03-14 DIAGNOSIS — L97822 Non-pressure chronic ulcer of other part of left lower leg with fat layer exposed: Secondary | ICD-10-CM | POA: Diagnosis not present

## 2018-03-16 DIAGNOSIS — L97922 Non-pressure chronic ulcer of unspecified part of left lower leg with fat layer exposed: Secondary | ICD-10-CM | POA: Diagnosis not present

## 2018-03-18 LAB — AEROBIC CULTURE W GRAM STAIN (SUPERFICIAL SPECIMEN): Gram Stain: NONE SEEN

## 2018-03-21 DIAGNOSIS — L89623 Pressure ulcer of left heel, stage 3: Secondary | ICD-10-CM | POA: Diagnosis not present

## 2018-03-21 DIAGNOSIS — G4730 Sleep apnea, unspecified: Secondary | ICD-10-CM | POA: Diagnosis not present

## 2018-03-21 DIAGNOSIS — L97522 Non-pressure chronic ulcer of other part of left foot with fat layer exposed: Secondary | ICD-10-CM | POA: Diagnosis not present

## 2018-03-21 DIAGNOSIS — L89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, stage 3: Secondary | ICD-10-CM | POA: Diagnosis not present

## 2018-03-21 DIAGNOSIS — G82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, complete: Secondary | ICD-10-CM | POA: Diagnosis not present

## 2018-03-21 DIAGNOSIS — L97822 Non-pressure chronic ulcer of other part of left lower leg with fat layer exposed: Secondary | ICD-10-CM | POA: Diagnosis not present

## 2018-03-21 DIAGNOSIS — L97512 Non-pressure chronic ulcer of other part of right foot with fat layer exposed: Secondary | ICD-10-CM | POA: Diagnosis not present

## 2018-03-21 DIAGNOSIS — I87312 Chronic venous hypertension (idiopathic) with ulcer of left lower extremity: Secondary | ICD-10-CM | POA: Diagnosis not present

## 2018-03-21 DIAGNOSIS — L97222 Non-pressure chronic ulcer of left calf with fat layer exposed: Secondary | ICD-10-CM | POA: Diagnosis not present

## 2018-03-21 DIAGNOSIS — I10 Essential (primary) hypertension: Secondary | ICD-10-CM | POA: Diagnosis not present

## 2018-03-21 DIAGNOSIS — I87332 Chronic venous hypertension (idiopathic) with ulcer and inflammation of left lower extremity: Secondary | ICD-10-CM | POA: Diagnosis not present

## 2018-03-21 DIAGNOSIS — B353 Tinea pedis: Secondary | ICD-10-CM | POA: Diagnosis not present

## 2018-03-28 ENCOUNTER — Encounter (HOSPITAL_BASED_OUTPATIENT_CLINIC_OR_DEPARTMENT_OTHER): Payer: BLUE CROSS/BLUE SHIELD | Attending: Internal Medicine

## 2018-03-28 DIAGNOSIS — I10 Essential (primary) hypertension: Secondary | ICD-10-CM | POA: Diagnosis not present

## 2018-03-28 DIAGNOSIS — Z8614 Personal history of Methicillin resistant Staphylococcus aureus infection: Secondary | ICD-10-CM | POA: Insufficient documentation

## 2018-03-28 DIAGNOSIS — G4730 Sleep apnea, unspecified: Secondary | ICD-10-CM | POA: Insufficient documentation

## 2018-03-28 DIAGNOSIS — L97512 Non-pressure chronic ulcer of other part of right foot with fat layer exposed: Secondary | ICD-10-CM | POA: Diagnosis not present

## 2018-03-28 DIAGNOSIS — I87312 Chronic venous hypertension (idiopathic) with ulcer of left lower extremity: Secondary | ICD-10-CM | POA: Diagnosis not present

## 2018-03-28 DIAGNOSIS — L03115 Cellulitis of right lower limb: Secondary | ICD-10-CM | POA: Insufficient documentation

## 2018-03-28 DIAGNOSIS — L89892 Pressure ulcer of other site, stage 2: Secondary | ICD-10-CM | POA: Diagnosis not present

## 2018-03-28 DIAGNOSIS — L97522 Non-pressure chronic ulcer of other part of left foot with fat layer exposed: Secondary | ICD-10-CM | POA: Insufficient documentation

## 2018-03-28 DIAGNOSIS — L97822 Non-pressure chronic ulcer of other part of left lower leg with fat layer exposed: Secondary | ICD-10-CM | POA: Diagnosis not present

## 2018-03-28 DIAGNOSIS — I87332 Chronic venous hypertension (idiopathic) with ulcer and inflammation of left lower extremity: Secondary | ICD-10-CM | POA: Insufficient documentation

## 2018-03-28 DIAGNOSIS — B353 Tinea pedis: Secondary | ICD-10-CM | POA: Insufficient documentation

## 2018-03-28 DIAGNOSIS — L89813 Pressure ulcer of head, stage 3: Secondary | ICD-10-CM | POA: Insufficient documentation

## 2018-03-28 DIAGNOSIS — G82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, complete: Secondary | ICD-10-CM | POA: Insufficient documentation

## 2018-03-28 DIAGNOSIS — L89623 Pressure ulcer of left heel, stage 3: Secondary | ICD-10-CM | POA: Diagnosis not present

## 2018-03-28 DIAGNOSIS — L89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, stage 3: Secondary | ICD-10-CM | POA: Diagnosis not present

## 2018-03-28 DIAGNOSIS — L97222 Non-pressure chronic ulcer of left calf with fat layer exposed: Secondary | ICD-10-CM | POA: Diagnosis not present

## 2018-03-31 DIAGNOSIS — G82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, complete: Secondary | ICD-10-CM | POA: Diagnosis not present

## 2018-03-31 DIAGNOSIS — L97512 Non-pressure chronic ulcer of other part of right foot with fat layer exposed: Secondary | ICD-10-CM | POA: Diagnosis not present

## 2018-03-31 DIAGNOSIS — Z8614 Personal history of Methicillin resistant Staphylococcus aureus infection: Secondary | ICD-10-CM | POA: Diagnosis not present

## 2018-03-31 DIAGNOSIS — I10 Essential (primary) hypertension: Secondary | ICD-10-CM | POA: Diagnosis not present

## 2018-03-31 DIAGNOSIS — L97522 Non-pressure chronic ulcer of other part of left foot with fat layer exposed: Secondary | ICD-10-CM | POA: Diagnosis not present

## 2018-03-31 DIAGNOSIS — L89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, stage 3: Secondary | ICD-10-CM | POA: Diagnosis not present

## 2018-03-31 DIAGNOSIS — I87332 Chronic venous hypertension (idiopathic) with ulcer and inflammation of left lower extremity: Secondary | ICD-10-CM | POA: Diagnosis not present

## 2018-03-31 DIAGNOSIS — L89813 Pressure ulcer of head, stage 3: Secondary | ICD-10-CM | POA: Diagnosis not present

## 2018-03-31 DIAGNOSIS — G4730 Sleep apnea, unspecified: Secondary | ICD-10-CM | POA: Diagnosis not present

## 2018-03-31 DIAGNOSIS — L03115 Cellulitis of right lower limb: Secondary | ICD-10-CM | POA: Diagnosis not present

## 2018-03-31 DIAGNOSIS — L89892 Pressure ulcer of other site, stage 2: Secondary | ICD-10-CM | POA: Diagnosis not present

## 2018-03-31 DIAGNOSIS — B353 Tinea pedis: Secondary | ICD-10-CM | POA: Diagnosis not present

## 2018-04-03 DIAGNOSIS — L299 Pruritus, unspecified: Secondary | ICD-10-CM | POA: Diagnosis not present

## 2018-04-03 DIAGNOSIS — R635 Abnormal weight gain: Secondary | ICD-10-CM | POA: Diagnosis not present

## 2018-04-03 DIAGNOSIS — Z6835 Body mass index (BMI) 35.0-35.9, adult: Secondary | ICD-10-CM | POA: Diagnosis not present

## 2018-04-03 DIAGNOSIS — E291 Testicular hypofunction: Secondary | ICD-10-CM | POA: Diagnosis not present

## 2018-04-04 DIAGNOSIS — Z8614 Personal history of Methicillin resistant Staphylococcus aureus infection: Secondary | ICD-10-CM | POA: Diagnosis not present

## 2018-04-04 DIAGNOSIS — L03115 Cellulitis of right lower limb: Secondary | ICD-10-CM | POA: Diagnosis not present

## 2018-04-04 DIAGNOSIS — G82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, complete: Secondary | ICD-10-CM | POA: Diagnosis not present

## 2018-04-04 DIAGNOSIS — L89623 Pressure ulcer of left heel, stage 3: Secondary | ICD-10-CM | POA: Diagnosis not present

## 2018-04-04 DIAGNOSIS — G4730 Sleep apnea, unspecified: Secondary | ICD-10-CM | POA: Diagnosis not present

## 2018-04-04 DIAGNOSIS — B353 Tinea pedis: Secondary | ICD-10-CM | POA: Diagnosis not present

## 2018-04-04 DIAGNOSIS — I10 Essential (primary) hypertension: Secondary | ICD-10-CM | POA: Diagnosis not present

## 2018-04-04 DIAGNOSIS — L97522 Non-pressure chronic ulcer of other part of left foot with fat layer exposed: Secondary | ICD-10-CM | POA: Diagnosis not present

## 2018-04-04 DIAGNOSIS — L89892 Pressure ulcer of other site, stage 2: Secondary | ICD-10-CM | POA: Diagnosis not present

## 2018-04-04 DIAGNOSIS — I87332 Chronic venous hypertension (idiopathic) with ulcer and inflammation of left lower extremity: Secondary | ICD-10-CM | POA: Diagnosis not present

## 2018-04-04 DIAGNOSIS — L89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, stage 3: Secondary | ICD-10-CM | POA: Diagnosis not present

## 2018-04-04 DIAGNOSIS — L97512 Non-pressure chronic ulcer of other part of right foot with fat layer exposed: Secondary | ICD-10-CM | POA: Diagnosis not present

## 2018-04-04 DIAGNOSIS — G4733 Obstructive sleep apnea (adult) (pediatric): Secondary | ICD-10-CM | POA: Diagnosis not present

## 2018-04-04 DIAGNOSIS — L89813 Pressure ulcer of head, stage 3: Secondary | ICD-10-CM | POA: Diagnosis not present
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
## 2018-04-06 ENCOUNTER — Encounter: Payer: Self-pay | Admitting: Nurse Practitioner

## 2018-04-06 ENCOUNTER — Encounter

## 2018-04-06 ENCOUNTER — Ambulatory Visit: Payer: BLUE CROSS/BLUE SHIELD | Admitting: Nurse Practitioner

## 2018-04-06 VITALS — BP 120/67 | HR 117

## 2018-04-06 DIAGNOSIS — G4733 Obstructive sleep apnea (adult) (pediatric): Secondary | ICD-10-CM | POA: Diagnosis not present

## 2018-04-06 DIAGNOSIS — Z9989 Dependence on other enabling machines and devices: Secondary | ICD-10-CM | POA: Diagnosis not present

## 2018-04-06 NOTE — Patient Instructions (Addendum)
CPAP compliance 9% Patient just got a new mask this week so he can sleep on his side due to  sacral wound Follow-up appointment with Dr. Raquel James 4 months

## 2018-04-11 DIAGNOSIS — G82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, complete: Secondary | ICD-10-CM | POA: Diagnosis not present

## 2018-04-11 DIAGNOSIS — G4730 Sleep apnea, unspecified: Secondary | ICD-10-CM | POA: Diagnosis not present

## 2018-04-11 DIAGNOSIS — L89892 Pressure ulcer of other site, stage 2: Secondary | ICD-10-CM | POA: Diagnosis not present

## 2018-04-11 DIAGNOSIS — L89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, stage 3: Secondary | ICD-10-CM | POA: Diagnosis not present

## 2018-04-11 DIAGNOSIS — L97522 Non-pressure chronic ulcer of other part of left foot with fat layer exposed: Secondary | ICD-10-CM | POA: Diagnosis not present

## 2018-04-11 DIAGNOSIS — L89623 Pressure ulcer of left heel, stage 3: Secondary | ICD-10-CM | POA: Diagnosis not present

## 2018-04-11 DIAGNOSIS — L89813 Pressure ulcer of head, stage 3: Secondary | ICD-10-CM | POA: Diagnosis not present

## 2018-04-11 DIAGNOSIS — I10 Essential (primary) hypertension: Secondary | ICD-10-CM | POA: Diagnosis not present

## 2018-04-11 DIAGNOSIS — Z8614 Personal history of Methicillin resistant Staphylococcus aureus infection: Secondary | ICD-10-CM | POA: Diagnosis not present

## 2018-04-11 DIAGNOSIS — I87332 Chronic venous hypertension (idiopathic) with ulcer and inflammation of left lower extremity: Secondary | ICD-10-CM | POA: Diagnosis not present

## 2018-04-11 DIAGNOSIS — B353 Tinea pedis: Secondary | ICD-10-CM | POA: Diagnosis not present

## 2018-04-11 DIAGNOSIS — L97512 Non-pressure chronic ulcer of other part of right foot with fat layer exposed: Secondary | ICD-10-CM | POA: Diagnosis not present

## 2018-04-11 DIAGNOSIS — L03115 Cellulitis of right lower limb: Secondary | ICD-10-CM | POA: Diagnosis not present

## 2018-04-12 DIAGNOSIS — N319 Neuromuscular dysfunction of bladder, unspecified: Secondary | ICD-10-CM | POA: Diagnosis not present

## 2018-04-12 DIAGNOSIS — N3020 Other chronic cystitis without hematuria: Secondary | ICD-10-CM | POA: Diagnosis not present

## 2018-04-18 DIAGNOSIS — L89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, stage 3: Secondary | ICD-10-CM | POA: Diagnosis not present

## 2018-04-18 DIAGNOSIS — L97522 Non-pressure chronic ulcer of other part of left foot with fat layer exposed: Secondary | ICD-10-CM | POA: Diagnosis not present

## 2018-04-18 DIAGNOSIS — L97221 Non-pressure chronic ulcer of left calf limited to breakdown of skin: Secondary | ICD-10-CM | POA: Diagnosis not present

## 2018-04-18 DIAGNOSIS — L97512 Non-pressure chronic ulcer of other part of right foot with fat layer exposed: Secondary | ICD-10-CM | POA: Diagnosis not present

## 2018-04-18 DIAGNOSIS — L89623 Pressure ulcer of left heel, stage 3: Secondary | ICD-10-CM | POA: Diagnosis not present

## 2018-04-18 DIAGNOSIS — L03115 Cellulitis of right lower limb: Secondary | ICD-10-CM | POA: Diagnosis not present

## 2018-04-18 DIAGNOSIS — G82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, complete: Secondary | ICD-10-CM | POA: Diagnosis not present

## 2018-04-18 DIAGNOSIS — B353 Tinea pedis: Secondary | ICD-10-CM | POA: Diagnosis not present

## 2018-04-18 DIAGNOSIS — I87332 Chronic venous hypertension (idiopathic) with ulcer and inflammation of left lower extremity: Secondary | ICD-10-CM | POA: Diagnosis not present

## 2018-04-18 DIAGNOSIS — L89813 Pressure ulcer of head, stage 3: Secondary | ICD-10-CM | POA: Diagnosis not present

## 2018-04-18 DIAGNOSIS — G4730 Sleep apnea, unspecified: Secondary | ICD-10-CM | POA: Diagnosis not present

## 2018-04-18 DIAGNOSIS — I10 Essential (primary) hypertension: Secondary | ICD-10-CM | POA: Diagnosis not present

## 2018-04-18 DIAGNOSIS — I87312 Chronic venous hypertension (idiopathic) with ulcer of left lower extremity: Secondary | ICD-10-CM | POA: Diagnosis not present

## 2018-04-18 DIAGNOSIS — L89892 Pressure ulcer of other site, stage 2: Secondary | ICD-10-CM | POA: Diagnosis not present

## 2018-04-18 DIAGNOSIS — Z8614 Personal history of Methicillin resistant Staphylococcus aureus infection: Secondary | ICD-10-CM | POA: Diagnosis not present

## 2018-04-18 IMAGING — DX DG FOOT COMPLETE 3+V*R*
3 series · 3 of 3 positions shown · non-contrast
Comparison: 09/29/2017

CLINICAL DATA: Nonhealing wound on right second toe medially

EXAM:
RIGHT FOOT COMPLETE - 3+ VIEW

[foot ap]
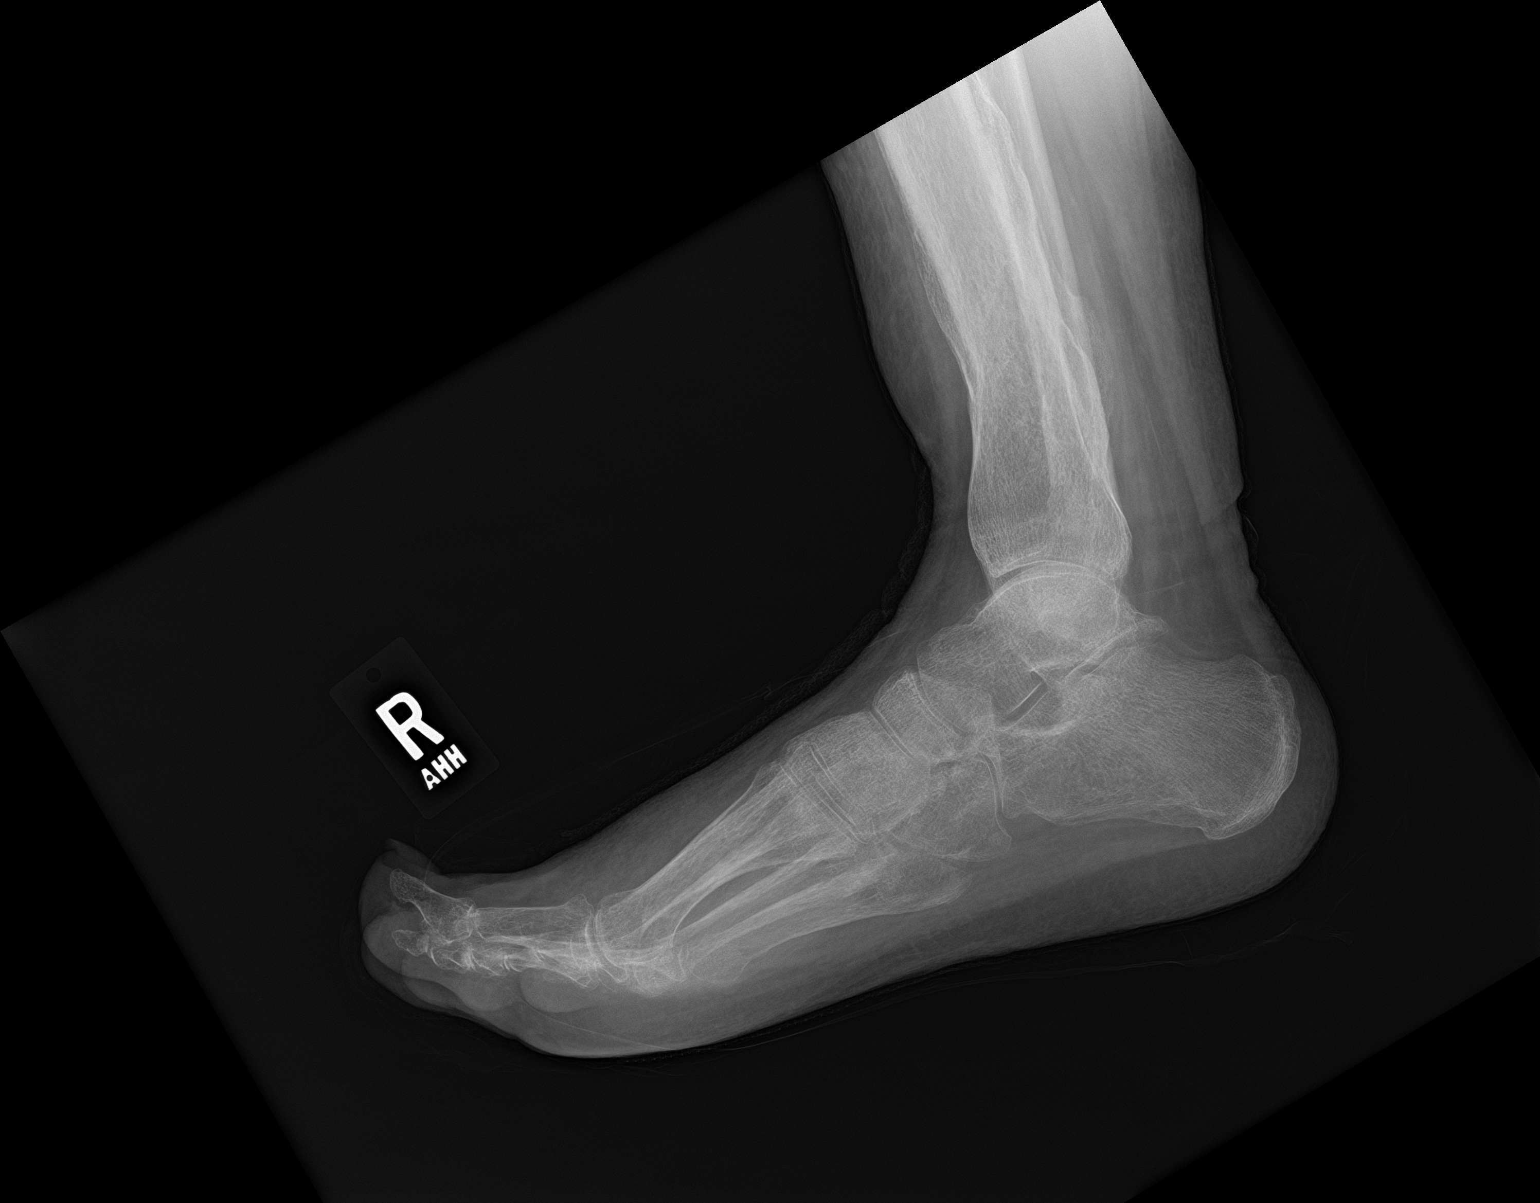

[foot obl]
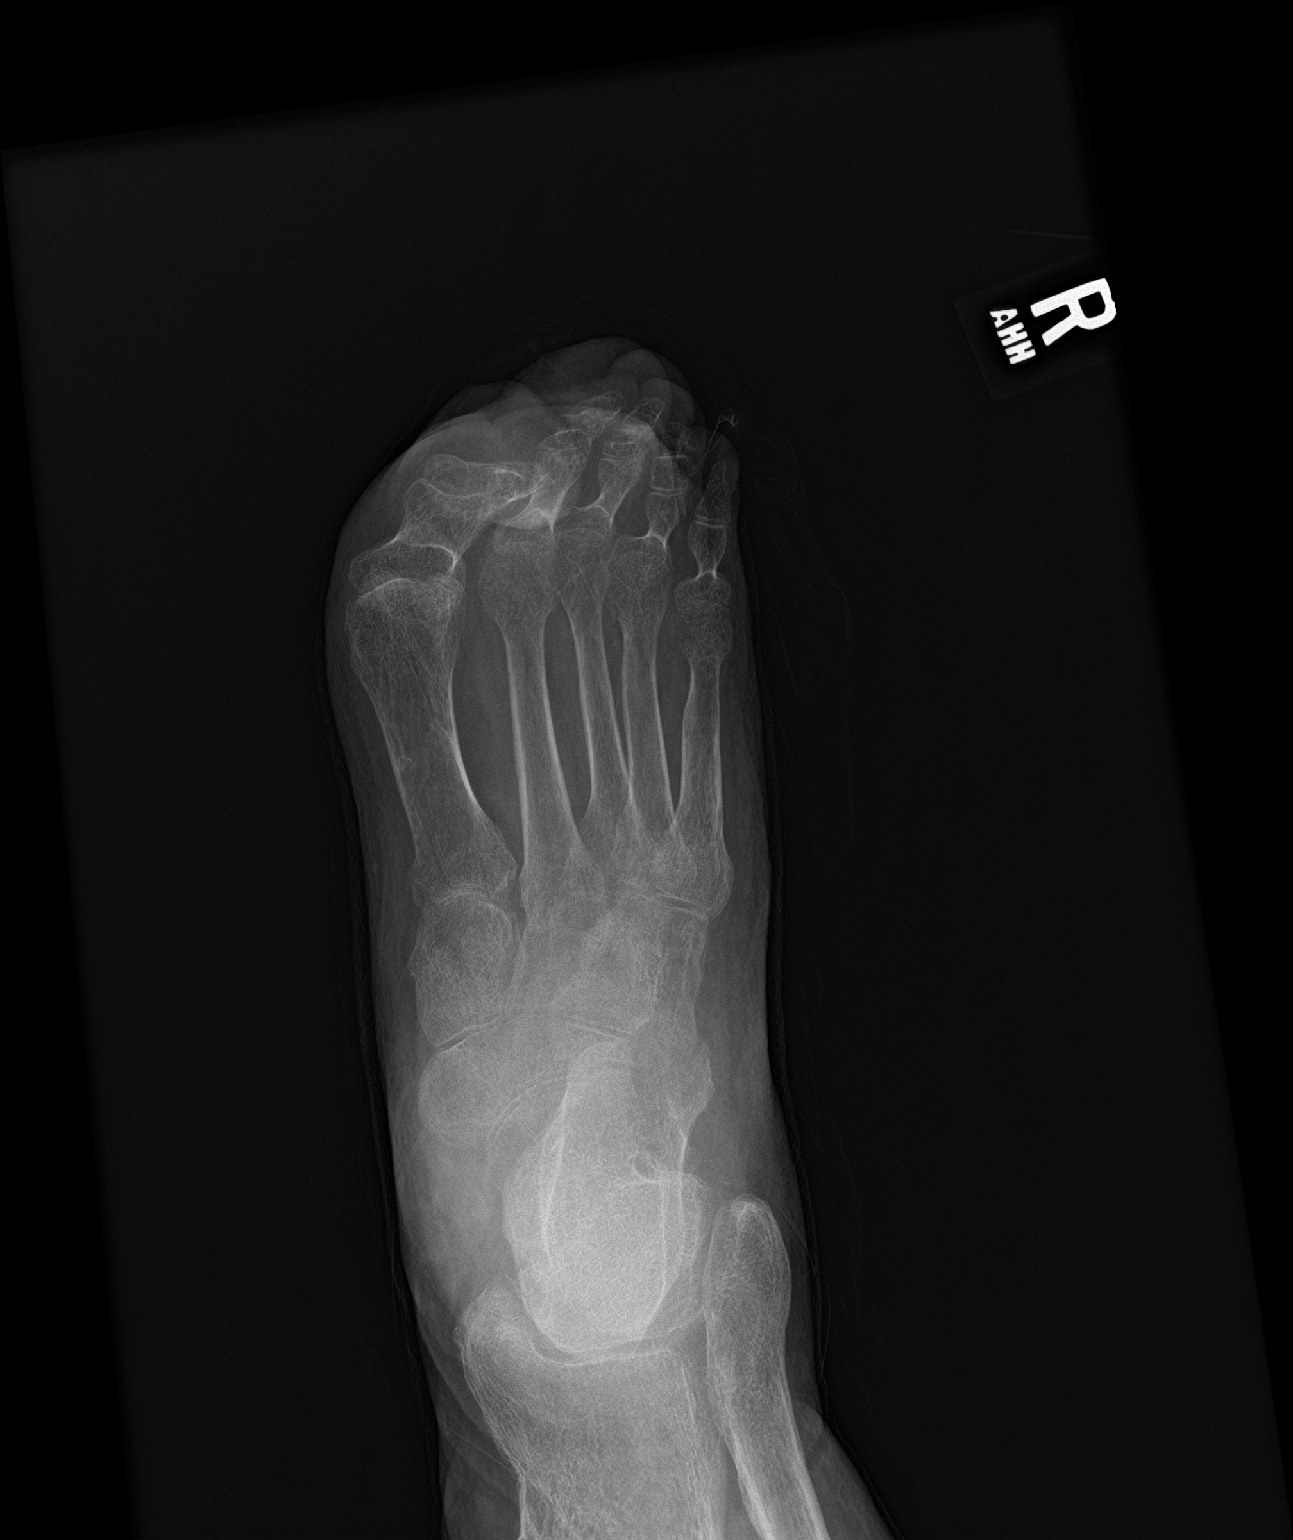

[foot lat]
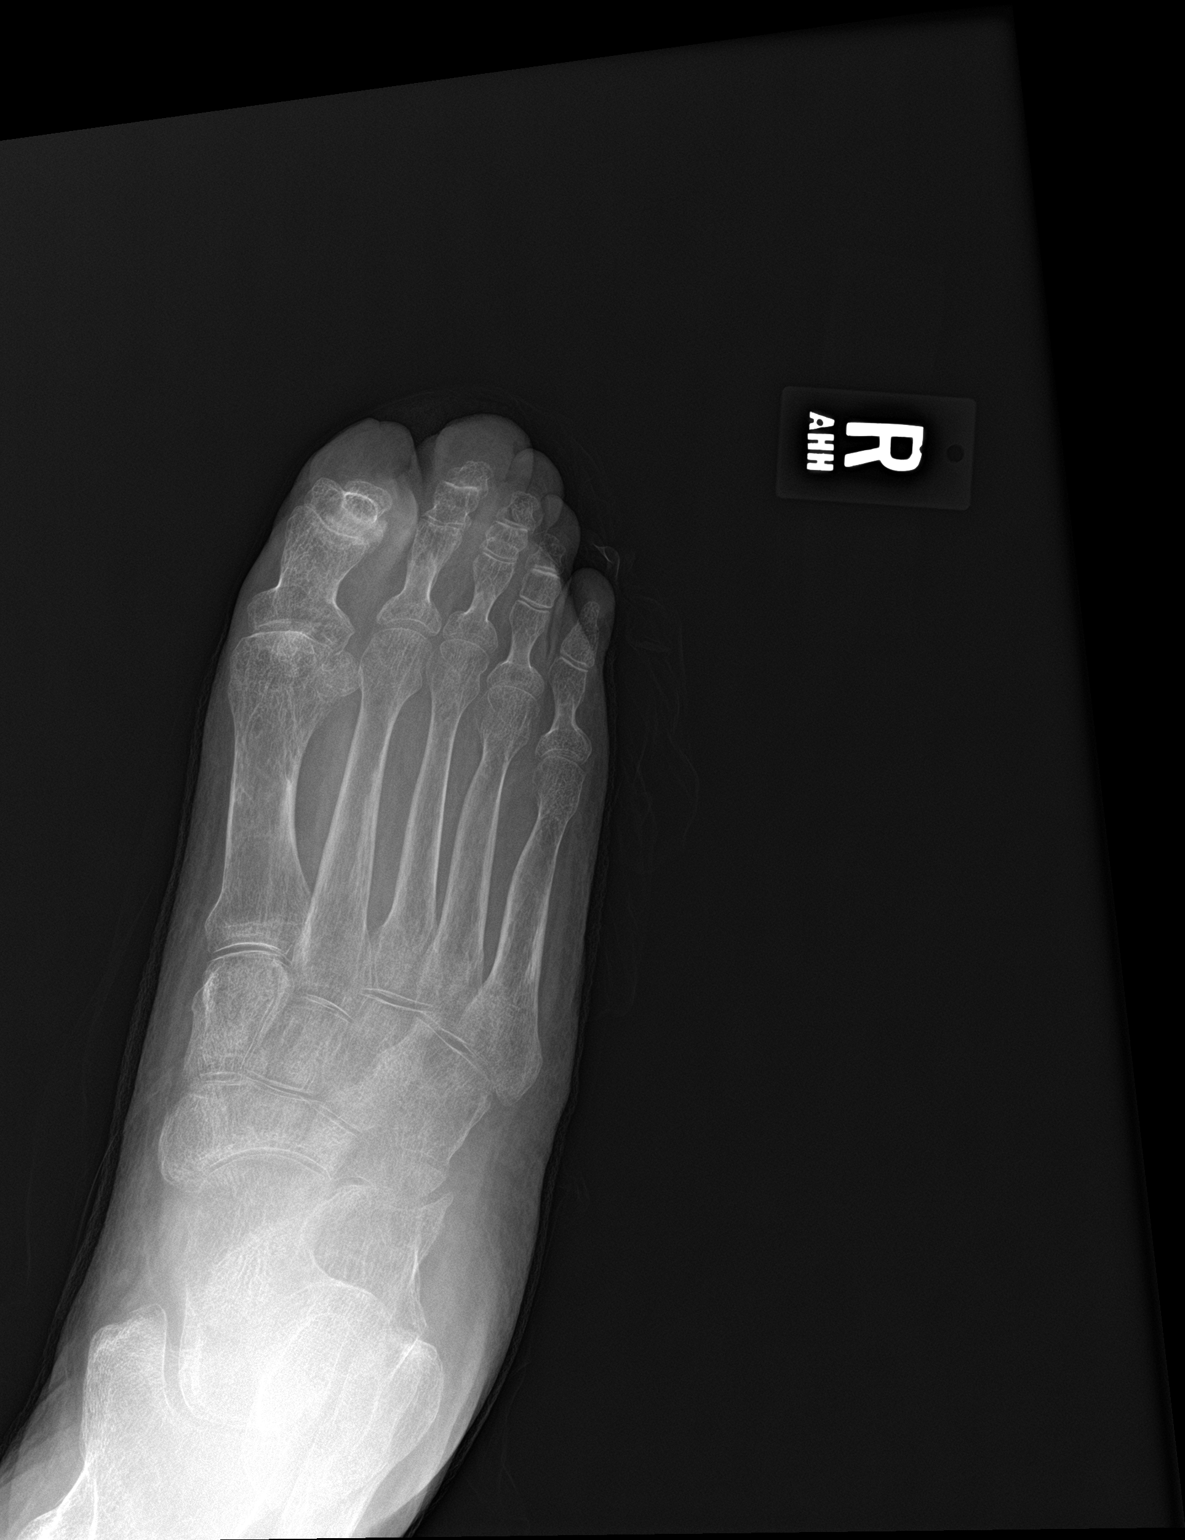

[3 of 3 positions shown; findings below may reference images not displayed]

FINDINGS: Soft tissue swelling involving the distal 1st and 2nd digit.

No underlying cortical irregularity to suggest acute osteomyelitis.

No fracture or dislocation is seen.

The joint spaces are preserved.
IMPRESSION: Soft tissue swelling involving the distal 1st and 2nd digit.

No radiographic findings to suggest acute osteomyelitis.

## 2018-04-20 DIAGNOSIS — L97512 Non-pressure chronic ulcer of other part of right foot with fat layer exposed: Secondary | ICD-10-CM | POA: Diagnosis not present

## 2018-04-20 DIAGNOSIS — L89813 Pressure ulcer of head, stage 3: Secondary | ICD-10-CM | POA: Diagnosis not present

## 2018-04-20 DIAGNOSIS — L89892 Pressure ulcer of other site, stage 2: Secondary | ICD-10-CM | POA: Diagnosis not present

## 2018-04-20 DIAGNOSIS — G4730 Sleep apnea, unspecified: Secondary | ICD-10-CM | POA: Diagnosis not present

## 2018-04-20 DIAGNOSIS — L89622 Pressure ulcer of left heel, stage 2: Secondary | ICD-10-CM | POA: Diagnosis not present

## 2018-04-20 DIAGNOSIS — L03115 Cellulitis of right lower limb: Secondary | ICD-10-CM | POA: Diagnosis not present

## 2018-04-20 DIAGNOSIS — I87311 Chronic venous hypertension (idiopathic) with ulcer of right lower extremity: Secondary | ICD-10-CM | POA: Diagnosis not present

## 2018-04-20 DIAGNOSIS — Z8614 Personal history of Methicillin resistant Staphylococcus aureus infection: Secondary | ICD-10-CM | POA: Diagnosis not present

## 2018-04-20 DIAGNOSIS — L89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, stage 3: Secondary | ICD-10-CM | POA: Diagnosis not present

## 2018-04-20 DIAGNOSIS — G82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, complete: Secondary | ICD-10-CM | POA: Diagnosis not present

## 2018-04-20 DIAGNOSIS — B353 Tinea pedis: Secondary | ICD-10-CM | POA: Diagnosis not present

## 2018-04-20 DIAGNOSIS — L97522 Non-pressure chronic ulcer of other part of left foot with fat layer exposed: Secondary | ICD-10-CM | POA: Diagnosis not present

## 2018-04-20 DIAGNOSIS — I10 Essential (primary) hypertension: Secondary | ICD-10-CM | POA: Diagnosis not present

## 2018-04-20 DIAGNOSIS — L03116 Cellulitis of left lower limb: Secondary | ICD-10-CM | POA: Diagnosis not present

## 2018-04-20 DIAGNOSIS — I87332 Chronic venous hypertension (idiopathic) with ulcer and inflammation of left lower extremity: Secondary | ICD-10-CM | POA: Diagnosis not present

## 2018-04-25 ENCOUNTER — Other Ambulatory Visit (HOSPITAL_COMMUNITY)
Admit: 2018-04-25 | Discharge: 2018-04-25 | Disposition: A | Discharge status: Home / Self Care | Payer: BLUE CROSS/BLUE SHIELD | Source: Ambulatory Visit | Attending: Internal Medicine | Admitting: Internal Medicine

## 2018-04-25 ENCOUNTER — Encounter (HOSPITAL_BASED_OUTPATIENT_CLINIC_OR_DEPARTMENT_OTHER): Payer: BLUE CROSS/BLUE SHIELD | Attending: Internal Medicine

## 2018-04-25 DIAGNOSIS — L97511 Non-pressure chronic ulcer of other part of right 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: Secondary | ICD-10-CM | POA: Diagnosis not present

## 2018-04-25 DIAGNOSIS — G82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, complete: Secondary | ICD-10-CM | POA: Diagnosis not present

## 2018-04-25 DIAGNOSIS — I87332 Chronic venous hypertension (idiopathic) with ulcer and inflammation of left lower extremity: Secondary | ICD-10-CM | POA: Insufficient documentation

## 2018-04-25 DIAGNOSIS — I10 Essential (primary) hypertension: Secondary | ICD-10-CM | POA: Insufficient documentation

## 2018-04-25 DIAGNOSIS — L97522 Non-pressure chronic ulcer of other part of left foot with fat layer exposed: Secondary | ICD-10-CM | POA: Diagnosis not present

## 2018-04-25 DIAGNOSIS — L97512 Non-pressure chronic ulcer of other part of right foot with fat layer exposed: Secondary | ICD-10-CM | POA: Diagnosis not present

## 2018-04-25 DIAGNOSIS — S31829A Unspecified open wound of left buttock, initial encounter: Secondary | ICD-10-CM | POA: Diagnosis not present

## 2018-04-25 DIAGNOSIS — G4730 Sleep apnea, unspecified: Secondary | ICD-10-CM | POA: Diagnosis not present

## 2018-04-25 DIAGNOSIS — L89623 Pressure ulcer of left heel, stage 3: Secondary | ICD-10-CM | POA: Insufficient documentation

## 2018-04-25 DIAGNOSIS — L03115 Cellulitis of right lower limb: Secondary | ICD-10-CM | POA: Diagnosis not present

## 2018-04-25 DIAGNOSIS — L97822 Non-pressure chronic ulcer of other part of left lower leg with fat layer exposed: Secondary | ICD-10-CM | POA: Diagnosis not present

## 2018-04-25 DIAGNOSIS — L89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, stage 3: Secondary | ICD-10-CM | POA: Diagnosis not present

## 2018-04-28 LAB — CARBAPENEM RESISTANCE PANEL
Carba Resistance IMP Gene: NOT DETECTED
Carba Resistance KPC Gene: NOT DETECTED
Carba Resistance NDM Gene: NOT DETECTED
Carba Resistance OXA48 Gene: NOT DETECTED
Carba Resistance VIM Gene: NOT DETECTED

## 2018-04-29 LAB — AEROBIC CULTURE W GRAM STAIN (SUPERFICIAL SPECIMEN)

## 2018-05-02 DIAGNOSIS — S91101A Unspecified open wound of right great toe without damage to nail, initial encounter: Secondary | ICD-10-CM | POA: Diagnosis not present

## 2018-05-02 DIAGNOSIS — L97522 Non-pressure chronic ulcer of other part of left foot with fat layer exposed: Secondary | ICD-10-CM | POA: Diagnosis not present

## 2018-05-02 DIAGNOSIS — L89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, stage 3: Secondary | ICD-10-CM | POA: Diagnosis not present

## 2018-05-02 DIAGNOSIS — B9562 Methicillin resistant Staphylococcus aureus infection as the cause of diseases classified elsewhere: Secondary | ICD-10-CM | POA: Diagnosis not present

## 2018-05-02 DIAGNOSIS — L89623 Pressure ulcer of left heel, stage 3: Secondary | ICD-10-CM | POA: Diagnosis not present

## 2018-05-02 DIAGNOSIS — L97512 Non-pressure chronic ulcer of other part of right foot with fat layer exposed: Secondary | ICD-10-CM | POA: Diagnosis not present

## 2018-05-02 DIAGNOSIS — G82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, complete: Secondary | ICD-10-CM | POA: Diagnosis not present

## 2018-05-02 DIAGNOSIS — I87332 Chronic venous hypertension (idiopathic) with ulcer and inflammation of left lower extremity: Secondary | ICD-10-CM | POA: Diagnosis not present

## 2018-05-04 DIAGNOSIS — N390 Urinary tract infection, site not specified: Secondary | ICD-10-CM | POA: Diagnosis not present

## 2018-05-04 DIAGNOSIS — Z6835 Body mass index (BMI) 35.0-35.9, adult: Secondary | ICD-10-CM | POA: Diagnosis not present

## 2018-05-04 DIAGNOSIS — R635 Abnormal weight gain: Secondary | ICD-10-CM | POA: Diagnosis not present

## 2018-05-04 DIAGNOSIS — R350 Frequency of micturition: Secondary | ICD-10-CM | POA: Diagnosis not present

## 2018-05-08 ENCOUNTER — Encounter: Payer: Self-pay | Admitting: Allergy and Immunology

## 2018-05-08 ENCOUNTER — Ambulatory Visit (INDEPENDENT_AMBULATORY_CARE_PROVIDER_SITE_OTHER): Payer: BLUE CROSS/BLUE SHIELD | Admitting: Allergy and Immunology

## 2018-05-08 VITALS — BP 102/68 | HR 100 | Temp 98.4°F | Resp 18 | Ht 70.0 in | Wt 240.0 lb

## 2018-05-08 DIAGNOSIS — D509 Iron deficiency anemia, unspecified: Secondary | ICD-10-CM | POA: Diagnosis not present

## 2018-05-08 DIAGNOSIS — L299 Pruritus, unspecified: Secondary | ICD-10-CM | POA: Diagnosis not present

## 2018-05-08 DIAGNOSIS — R740 Nonspecific elevation of levels of transaminase and lactic acid dehydrogenase [LDH]: Secondary | ICD-10-CM

## 2018-05-08 DIAGNOSIS — D72828 Other elevated white blood cell count: Secondary | ICD-10-CM | POA: Diagnosis not present

## 2018-05-08 DIAGNOSIS — R7401 Elevation of levels of liver transaminase levels: Secondary | ICD-10-CM
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
## 2018-05-08 NOTE — Patient Instructions (Addendum)
  1.  Allergen avoidance measures?  2.  Can use loratadine 10 mg tablet 1-2 times a day if needed  3.  Blood - CBC w/diff, CMP, GGTP, ferritin  4.  Further evaluation?  Yes, if continued problems  5.  Contact clinic in 4 weeks with update

## 2018-05-09 ENCOUNTER — Encounter: Payer: Self-pay | Admitting: Allergy and Immunology

## 2018-05-09 DIAGNOSIS — L97512 Non-pressure chronic ulcer of other part of right foot with fat layer exposed: Secondary | ICD-10-CM | POA: Diagnosis not present

## 2018-05-09 DIAGNOSIS — L89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, stage 3: Secondary | ICD-10-CM | POA: Diagnosis not present

## 2018-05-09 DIAGNOSIS — L89623 Pressure ulcer of left heel, stage 3: Secondary | ICD-10-CM | POA: Diagnosis not present

## 2018-05-09 DIAGNOSIS — G82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, complete: Secondary | ICD-10-CM | POA: Diagnosis not present

## 2018-05-09 DIAGNOSIS — I87332 Chronic venous hypertension (idiopathic) with ulcer and inflammation of left lower extremity: Secondary | ICD-10-CM | POA: Diagnosis not present

## 2018-05-09 DIAGNOSIS — L97522 Non-pressure chronic ulcer of other part of left foot with fat layer exposed: Secondary | ICD-10-CM | POA: Diagnosis not present

## 2018-05-09 LAB — COMPREHENSIVE METABOLIC PANEL
ALT: 17 IU/L (ref 0–44)
AST: 17 IU/L (ref 0–40)
Albumin/Globulin Ratio: 0.9 — ABNORMAL LOW (ref 1.2–2.2)
Albumin: 3.7 g/dL (ref 3.5–5.5)
Alkaline Phosphatase: 97 IU/L (ref 39–117)
BUN/Creatinine Ratio: 19 (ref 9–20)
BUN: 11 mg/dL (ref 6–20)
Bilirubin Total: <0.2 mg/dL (ref 0.0–1.2)
CO2: 24 mmol/L (ref 20–29)
Calcium: 8.8 mg/dL (ref 8.7–10.2)
Chloride: 99 mmol/L (ref 96–106)
Creatinine, Ser: 0.57 mg/dL — ABNORMAL LOW (ref 0.76–1.27)
GFR calc Af Amer: 160 mL/min/{1.73_m2} (ref >59)
GFR calc non Af Amer: 139 mL/min/{1.73_m2} (ref >59)
Globulin, Total: 3.9 g/dL (ref 1.5–4.5)
Glucose: 82 mg/dL (ref 65–99)
Potassium: 4.6 mmol/L (ref 3.5–5.2)
Sodium: 138 mmol/L (ref 134–144)
Total Protein: 7.6 g/dL (ref 6.0–8.5)

## 2018-05-09 LAB — CBC WITH DIFFERENTIAL/PLATELET
Basophils Absolute: 0.1 10*3/uL (ref 0.0–0.2)
Basos: 0 %
EOS (ABSOLUTE): 0.4 10*3/uL (ref 0.0–0.4)
Eos: 3 %
Hematocrit: 34 % — ABNORMAL LOW (ref 37.5–51.0)
Hemoglobin: 11.4 g/dL — ABNORMAL LOW (ref 13.0–17.7)
Immature Grans (Abs): 0 10*3/uL (ref 0.0–0.1)
Immature Granulocytes: 0 %
Lymphocytes Absolute: 2.4 10*3/uL (ref 0.7–3.1)
Lymphs: 16 %
MCH: 26.5 pg — ABNORMAL LOW (ref 26.6–33.0)
MCHC: 33.5 g/dL (ref 31.5–35.7)
MCV: 79 fL (ref 79–97)
Monocytes Absolute: 1.7 10*3/uL — ABNORMAL HIGH (ref 0.1–0.9)
Monocytes: 11 %
Neutrophils Absolute: 10.6 10*3/uL — ABNORMAL HIGH (ref 1.4–7.0)
Neutrophils: 70 %
Platelets: 659 10*3/uL — ABNORMAL HIGH (ref 150–450)
RBC: 4.3 x10E6/uL (ref 4.14–5.80)
RDW: 15.3 % (ref 12.3–15.4)
WBC: 15.1 10*3/uL — ABNORMAL HIGH (ref 3.4–10.8)

## 2018-05-09 LAB — GAMMA GT: GGT: 55 IU/L (ref 0–65)

## 2018-05-09 LAB — FERRITIN: Ferritin: 118 ng/mL (ref 30–400)

## 2018-05-16 ENCOUNTER — Other Ambulatory Visit (HOSPITAL_BASED_OUTPATIENT_CLINIC_OR_DEPARTMENT_OTHER): Payer: Self-pay | Admitting: Internal Medicine

## 2018-05-16 ENCOUNTER — Ambulatory Visit (HOSPITAL_COMMUNITY)
Admission: RE | Admit: 2018-05-16 | Discharge: 2018-05-16 | Disposition: A | Discharge status: Home / Self Care | Payer: BLUE CROSS/BLUE SHIELD | Source: Ambulatory Visit | Attending: Internal Medicine | Admitting: Internal Medicine

## 2018-05-16 DIAGNOSIS — S91102A Unspecified open wound of left great toe without damage to nail, initial encounter: Secondary | ICD-10-CM | POA: Diagnosis not present

## 2018-05-16 DIAGNOSIS — L89329 Pressure ulcer of left buttock, unspecified stage: Secondary | ICD-10-CM | POA: Diagnosis not present

## 2018-05-16 DIAGNOSIS — S91301A Unspecified open wound, right foot, initial encounter: Secondary | ICD-10-CM | POA: Insufficient documentation

## 2018-05-16 DIAGNOSIS — S91101A Unspecified open wound of right great toe without damage to nail, initial encounter: Secondary | ICD-10-CM | POA: Diagnosis not present

## 2018-05-16 DIAGNOSIS — D72829 Elevated white blood cell count, unspecified: Secondary | ICD-10-CM | POA: Diagnosis not present

## 2018-05-16 DIAGNOSIS — L03116 Cellulitis of left lower limb: Secondary | ICD-10-CM | POA: Diagnosis not present

## 2018-05-16 DIAGNOSIS — S91302A Unspecified open wound, left foot, initial encounter: Secondary | ICD-10-CM | POA: Insufficient documentation

## 2018-05-16 DIAGNOSIS — M7989 Other specified soft tissue disorders: Secondary | ICD-10-CM | POA: Insufficient documentation

## 2018-05-16 DIAGNOSIS — L89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, stage 3: Secondary | ICD-10-CM | POA: Diagnosis not present

## 2018-05-16 DIAGNOSIS — L97522 Non-pressure chronic ulcer of other part of left foot with fat layer exposed: Secondary | ICD-10-CM | POA: Diagnosis not present

## 2018-05-16 DIAGNOSIS — M86172 Other acute osteomyelitis, left ankle and foot: Secondary | ICD-10-CM

## 2018-05-16 DIAGNOSIS — L97512 Non-pressure chronic ulcer of other part of right foot with fat layer exposed: Secondary | ICD-10-CM | POA: Diagnosis not present

## 2018-05-16 DIAGNOSIS — S31000A Unspecified open wound of lower back and pelvis without penetration into retroperitoneum, initial encounter: Secondary | ICD-10-CM | POA: Diagnosis not present

## 2018-05-16 DIAGNOSIS — B9562 Methicillin resistant Staphylococcus aureus infection as the cause of diseases classified elsewhere: Secondary | ICD-10-CM | POA: Diagnosis not present

## 2018-05-16 DIAGNOSIS — M86171 Other acute osteomyelitis, right ankle and foot: Secondary | ICD-10-CM

## 2018-05-16 DIAGNOSIS — G82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, complete: Secondary | ICD-10-CM | POA: Diagnosis not present

## 2018-05-16 DIAGNOSIS — I87332 Chronic venous hypertension (idiopathic) with ulcer and inflammation of left lower extremity: Secondary | ICD-10-CM | POA: Diagnosis not present

## 2018-05-23 ENCOUNTER — Encounter (HOSPITAL_BASED_OUTPATIENT_CLINIC_OR_DEPARTMENT_OTHER): Payer: BLUE CROSS/BLUE SHIELD | Attending: Internal Medicine

## 2018-05-23 DIAGNOSIS — I87332 Chronic venous hypertension (idiopathic) with ulcer and inflammation of left lower extremity: Secondary | ICD-10-CM | POA: Insufficient documentation

## 2018-05-23 DIAGNOSIS — S91302A Unspecified open wound, left foot, initial encounter: Secondary | ICD-10-CM | POA: Diagnosis not present

## 2018-05-23 DIAGNOSIS — B353 Tinea pedis: Secondary | ICD-10-CM | POA: Insufficient documentation

## 2018-05-23 DIAGNOSIS — L89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, stage 3: Secondary | ICD-10-CM | POA: Diagnosis not present

## 2018-05-23 DIAGNOSIS — G8220 Paraplegia, unspecified: Secondary | ICD-10-CM | POA: Insufficient documentation

## 2018-05-23 DIAGNOSIS — S91301A Unspecified open wound, right foot, initial encounter: Secondary | ICD-10-CM | POA: Diagnosis not present

## 2018-05-23 DIAGNOSIS — I10 Essential (primary) hypertension: Secondary | ICD-10-CM | POA: Insufficient documentation

## 2018-05-23 DIAGNOSIS — L97522 Non-pressure chronic ulcer of other part of left foot with fat layer exposed: Secondary | ICD-10-CM | POA: Insufficient documentation

## 2018-05-23 DIAGNOSIS — L97512 Non-pressure chronic ulcer of other part of right foot with fat layer exposed: Secondary | ICD-10-CM | POA: Insufficient documentation

## 2018-05-23 DIAGNOSIS — G4730 Sleep apnea, unspecified: Secondary | ICD-10-CM | POA: Diagnosis not present

## 2018-05-23 DIAGNOSIS — L89623 Pressure ulcer of left heel, stage 3: Secondary | ICD-10-CM | POA: Diagnosis not present

## 2018-05-29 DIAGNOSIS — X58XXXA Exposure to other specified factors, initial encounter: Secondary | ICD-10-CM | POA: Diagnosis not present

## 2018-05-29 DIAGNOSIS — L03116 Cellulitis of left lower limb: Secondary | ICD-10-CM | POA: Diagnosis not present

## 2018-05-29 DIAGNOSIS — L89322 Pressure ulcer of left buttock, stage 2: Secondary | ICD-10-CM | POA: Diagnosis not present

## 2018-05-29 DIAGNOSIS — S71002A Unspecified open wound, left hip, initial encounter: Secondary | ICD-10-CM | POA: Diagnosis not present

## 2018-05-30 DIAGNOSIS — G4730 Sleep apnea, unspecified: Secondary | ICD-10-CM | POA: Diagnosis not present

## 2018-05-30 DIAGNOSIS — L89623 Pressure ulcer of left heel, stage 3: Secondary | ICD-10-CM | POA: Diagnosis not present

## 2018-05-30 DIAGNOSIS — L97522 Non-pressure chronic ulcer of other part of left foot with fat layer exposed: Secondary | ICD-10-CM | POA: Diagnosis not present

## 2018-05-30 DIAGNOSIS — G8220 Paraplegia, unspecified: Secondary | ICD-10-CM | POA: Diagnosis not present

## 2018-05-30 DIAGNOSIS — I87332 Chronic venous hypertension (idiopathic) with ulcer and inflammation of left lower extremity: Secondary | ICD-10-CM | POA: Diagnosis not present

## 2018-05-30 DIAGNOSIS — L89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, stage 3: Secondary | ICD-10-CM | POA: Diagnosis not present

## 2018-05-30 DIAGNOSIS — S91302A Unspecified open wound, left foot, initial encounter: Secondary | ICD-10-CM | POA: Diagnosis not present

## 2018-05-30 DIAGNOSIS — B353 Tinea pedis: Secondary | ICD-10-CM | POA: Diagnosis not present

## 2018-05-30 DIAGNOSIS — L97512 Non-pressure chronic ulcer of other part of right foot with fat layer exposed: Secondary | ICD-10-CM | POA: Diagnosis not present

## 2018-05-30 DIAGNOSIS — I10 Essential (primary) hypertension: Secondary | ICD-10-CM | POA: Diagnosis not present

## 2018-06-02 DIAGNOSIS — R635 Abnormal weight gain: Secondary | ICD-10-CM | POA: Diagnosis not present

## 2018-06-02 DIAGNOSIS — Z6835 Body mass index (BMI) 35.0-35.9, adult: Secondary | ICD-10-CM | POA: Diagnosis not present

## 2018-06-05 DIAGNOSIS — X58XXXD Exposure to other specified factors, subsequent encounter: Secondary | ICD-10-CM | POA: Diagnosis not present

## 2018-06-05 DIAGNOSIS — L89324 Pressure ulcer of left buttock, stage 4: Secondary | ICD-10-CM | POA: Diagnosis not present

## 2018-06-05 DIAGNOSIS — S24103D Unspecified injury at T7-T10 level of thoracic spinal cord, subsequent encounter: Secondary | ICD-10-CM | POA: Diagnosis not present

## 2018-06-06 DIAGNOSIS — L89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, stage 3: Secondary | ICD-10-CM | POA: Diagnosis not present

## 2018-06-06 DIAGNOSIS — I10 Essential (primary) hypertension: Secondary | ICD-10-CM | POA: Diagnosis not present

## 2018-06-06 DIAGNOSIS — G8220 Paraplegia, unspecified: Secondary | ICD-10-CM | POA: Diagnosis not present

## 2018-06-06 DIAGNOSIS — L97522 Non-pressure chronic ulcer of other part of left foot with fat layer exposed: Secondary | ICD-10-CM | POA: Diagnosis not present

## 2018-06-06 DIAGNOSIS — G4730 Sleep apnea, unspecified: Secondary | ICD-10-CM | POA: Diagnosis not present

## 2018-06-06 DIAGNOSIS — L97922 Non-pressure chronic ulcer of unspecified part of left lower leg with fat layer exposed: Secondary | ICD-10-CM | POA: Diagnosis not present

## 2018-06-06 DIAGNOSIS — B353 Tinea pedis: Secondary | ICD-10-CM | POA: Diagnosis not present

## 2018-06-06 DIAGNOSIS — L89623 Pressure ulcer of left heel, stage 3: Secondary | ICD-10-CM | POA: Diagnosis not present

## 2018-06-06 DIAGNOSIS — I87332 Chronic venous hypertension (idiopathic) with ulcer and inflammation of left lower extremity: Secondary | ICD-10-CM | POA: Diagnosis not present

## 2018-06-06 DIAGNOSIS — L97512 Non-pressure chronic ulcer of other part of right foot with fat layer exposed: Secondary | ICD-10-CM | POA: Diagnosis not present

## 2018-06-12 DIAGNOSIS — N3001 Acute cystitis with hematuria: Secondary | ICD-10-CM | POA: Diagnosis not present

## 2018-06-12 DIAGNOSIS — N3090 Cystitis, unspecified without hematuria: Secondary | ICD-10-CM | POA: Diagnosis not present

## 2018-06-13 DIAGNOSIS — L97512 Non-pressure chronic ulcer of other part of right foot with fat layer exposed: Secondary | ICD-10-CM | POA: Diagnosis not present

## 2018-06-13 DIAGNOSIS — B353 Tinea pedis: Secondary | ICD-10-CM | POA: Diagnosis not present

## 2018-06-13 DIAGNOSIS — L89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, stage 3: Secondary | ICD-10-CM | POA: Diagnosis not present

## 2018-06-13 DIAGNOSIS — G4730 Sleep apnea, unspecified: Secondary | ICD-10-CM | POA: Diagnosis not present

## 2018-06-13 DIAGNOSIS — G8220 Paraplegia, unspecified: Secondary | ICD-10-CM | POA: Diagnosis not present

## 2018-06-13 DIAGNOSIS — I10 Essential (primary) hypertension: Secondary | ICD-10-CM | POA: Diagnosis not present

## 2018-06-13 DIAGNOSIS — S91302A Unspecified open wound, left foot, initial encounter: Secondary | ICD-10-CM | POA: Diagnosis not present

## 2018-06-13 DIAGNOSIS — L97522 Non-pressure chronic ulcer of other part of left foot with fat layer exposed: Secondary | ICD-10-CM | POA: Diagnosis not present

## 2018-06-13 DIAGNOSIS — I87332 Chronic venous hypertension (idiopathic) with ulcer and inflammation of left lower extremity: Secondary | ICD-10-CM | POA: Diagnosis not present

## 2018-06-13 DIAGNOSIS — L89623 Pressure ulcer of left heel, stage 3: Secondary | ICD-10-CM | POA: Diagnosis not present

## 2018-06-20 ENCOUNTER — Other Ambulatory Visit (HOSPITAL_COMMUNITY)
Admit: 2018-06-20 | Discharge: 2018-06-20 | Disposition: A | Discharge status: Home / Self Care | Payer: BLUE CROSS/BLUE SHIELD | Source: Ambulatory Visit | Attending: Internal Medicine | Admitting: Internal Medicine

## 2018-06-20 DIAGNOSIS — L89623 Pressure ulcer of left heel, stage 3: Secondary | ICD-10-CM | POA: Diagnosis not present

## 2018-06-20 DIAGNOSIS — G8220 Paraplegia, unspecified: Secondary | ICD-10-CM | POA: Diagnosis not present

## 2018-06-20 DIAGNOSIS — I10 Essential (primary) hypertension: Secondary | ICD-10-CM | POA: Diagnosis not present

## 2018-06-20 DIAGNOSIS — L89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, stage 3: Secondary | ICD-10-CM | POA: Diagnosis not present

## 2018-06-20 DIAGNOSIS — L97522 Non-pressure chronic ulcer of other part of left foot with fat layer exposed: Secondary | ICD-10-CM | POA: Diagnosis not present

## 2018-06-20 DIAGNOSIS — M7989 Other specified soft tissue disorders: Secondary | ICD-10-CM | POA: Diagnosis not present

## 2018-06-20 DIAGNOSIS — L539 Erythematous condition, unspecified: Secondary | ICD-10-CM | POA: Diagnosis not present

## 2018-06-20 DIAGNOSIS — G4730 Sleep apnea, unspecified: Secondary | ICD-10-CM | POA: Diagnosis not present

## 2018-06-20 DIAGNOSIS — L97512 Non-pressure chronic ulcer of other part of right foot with fat layer exposed: Secondary | ICD-10-CM | POA: Diagnosis not present

## 2018-06-20 DIAGNOSIS — F331 Major depressive disorder, recurrent, moderate: Secondary | ICD-10-CM | POA: Diagnosis not present

## 2018-06-20 DIAGNOSIS — L03116 Cellulitis of left lower limb: Secondary | ICD-10-CM | POA: Insufficient documentation

## 2018-06-20 DIAGNOSIS — I87332 Chronic venous hypertension (idiopathic) with ulcer and inflammation of left lower extremity: Secondary | ICD-10-CM | POA: Diagnosis not present

## 2018-06-20 DIAGNOSIS — B353 Tinea pedis: Secondary | ICD-10-CM | POA: Diagnosis not present

## 2018-06-23 LAB — AEROBIC CULTURE W GRAM STAIN (SUPERFICIAL SPECIMEN)

## 2018-06-27 ENCOUNTER — Encounter (HOSPITAL_BASED_OUTPATIENT_CLINIC_OR_DEPARTMENT_OTHER): Payer: BLUE CROSS/BLUE SHIELD | Attending: Internal Medicine

## 2018-06-27 ENCOUNTER — Telehealth: Payer: Self-pay

## 2018-06-27 DIAGNOSIS — I10 Essential (primary) hypertension: Secondary | ICD-10-CM | POA: Diagnosis not present

## 2018-06-27 DIAGNOSIS — Z87891 Personal history of nicotine dependence: Secondary | ICD-10-CM | POA: Insufficient documentation

## 2018-06-27 DIAGNOSIS — L89324 Pressure ulcer of left buttock, stage 4: Secondary | ICD-10-CM | POA: Diagnosis not present

## 2018-06-27 DIAGNOSIS — L97522 Non-pressure chronic ulcer of other part of left foot with fat layer exposed: Secondary | ICD-10-CM | POA: Insufficient documentation

## 2018-06-27 DIAGNOSIS — G82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, complete: Secondary | ICD-10-CM | POA: Insufficient documentation

## 2018-06-27 DIAGNOSIS — L89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, stage 3: Secondary | ICD-10-CM | POA: Diagnosis not present

## 2018-06-27 DIAGNOSIS — G4730 Sleep apnea, unspecified: Secondary | ICD-10-CM | POA: Insufficient documentation

## 2018-06-27 DIAGNOSIS — L97512 Non-pressure chronic ulcer of other part of right foot with fat layer exposed: Secondary | ICD-10-CM | POA: Diagnosis not present

## 2018-06-27 DIAGNOSIS — L89623 Pressure ulcer of left heel, stage 3: Secondary | ICD-10-CM | POA: Diagnosis not present

## 2018-06-27 NOTE — Telephone Encounter (Signed)
PT called today regarding order for IV antibiotics stated he was at [facility] who informed pt he would need a one time dose of [place]. Orders have not been faxed to our office yet will inform MD that wound care would like to have pt have one time dose of antibiotics to treat MRSSA. [place]

## 2018-06-27 NOTE — Telephone Encounter (Signed)
That is fine. I will be in office tomorrow to sign orders

## 2018-06-28 NOTE — Telephone Encounter (Signed)
Per verbal order called [facility] to find out the price for pt to have infusion of Dalbavancin 1500 mgx1, due to [place] costing too much. Spoke with Emmit Pomfret at [facility] who stated she would need orders, insurance information, demographics and diagnosis faxed to her in order to see how much pt would be charged with insurance. Faxed requested forms to [phone]. Sharrie Rothman will reach back out to the office once she runs the claim for the pt. Md stated if medication cost to much or is not cover under insurance pt might have to take oral antibiotics. Will wait on an update from [facility].  [place]

## 2018-06-30 NOTE — Telephone Encounter (Signed)
Received a call from Sharrie Rothman at [facility] who informed CMA that patients infusion of Dalbavancin is not covered if done at home. Sharrie Rothman stated it would be covered if patient was to go to short stay or another outpatient infusion Center. Will see if Dr. Baxter Flattery would like for patient to be seen at Short stay for one time dose of antibiotic.  [place]

## 2018-06-30 NOTE — Telephone Encounter (Signed)
New orders need to be signed and sent to Short Stay. Home infusion is not covered under insurance. [place]

## 2018-06-30 NOTE — Telephone Encounter (Signed)
Did the patient get his dalbavancin infusion?

## 2018-07-03 ENCOUNTER — Telehealth: Payer: Self-pay

## 2018-07-03 DIAGNOSIS — G4730 Sleep apnea, unspecified: Secondary | ICD-10-CM | POA: Diagnosis not present

## 2018-07-03 DIAGNOSIS — L97522 Non-pressure chronic ulcer of other part of left foot with fat layer exposed: Secondary | ICD-10-CM | POA: Diagnosis not present

## 2018-07-03 DIAGNOSIS — L89324 Pressure ulcer of left buttock, stage 4: Secondary | ICD-10-CM | POA: Diagnosis not present

## 2018-07-03 DIAGNOSIS — Z87891 Personal history of nicotine dependence: Secondary | ICD-10-CM | POA: Diagnosis not present

## 2018-07-03 DIAGNOSIS — L89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, stage 3: Secondary | ICD-10-CM | POA: Diagnosis not present

## 2018-07-03 DIAGNOSIS — I10 Essential (primary) hypertension: Secondary | ICD-10-CM | POA: Diagnosis not present

## 2018-07-03 DIAGNOSIS — L89623 Pressure ulcer of left heel, stage 3: Secondary | ICD-10-CM | POA: Diagnosis not present

## 2018-07-03 DIAGNOSIS — L97512 Non-pressure chronic ulcer of other part of right foot with fat layer exposed: Secondary | ICD-10-CM | POA: Diagnosis not present

## 2018-07-03 DIAGNOSIS — G82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, complete: Secondary | ICD-10-CM | POA: Diagnosis not present

## 2018-07-03 NOTE — Telephone Encounter (Addendum)
Called Short Stay per Dr. Baxter Flattery to have patient seen for one time dose of Dalbavancin 1500mg . [facility] at short stay was able to schedule patient to be seen on 8/16 at 11. Patient was notified of this appointment and will call us if he has any questions. Patient states he has no fevers or pains.  [place]

## 2018-07-04 DIAGNOSIS — Z6830 Body mass index (BMI) 30.0-30.9, adult: Secondary | ICD-10-CM | POA: Diagnosis not present

## 2018-07-04 DIAGNOSIS — R635 Abnormal weight gain: Secondary | ICD-10-CM | POA: Diagnosis not present

## 2018-07-06 ENCOUNTER — Other Ambulatory Visit (HOSPITAL_COMMUNITY): Payer: Self-pay

## 2018-07-07 ENCOUNTER — Ambulatory Visit (HOSPITAL_COMMUNITY)
Admission: RE | Admit: 2018-07-07 | Discharge: 2018-07-07 | Disposition: A | Discharge status: Home / Self Care | Payer: BLUE CROSS/BLUE SHIELD | Source: Ambulatory Visit | Attending: Internal Medicine | Admitting: Internal Medicine

## 2018-07-07 DIAGNOSIS — Z22322 Carrier or suspected carrier of Methicillin resistant Staphylococcus aureus: Secondary | ICD-10-CM | POA: Insufficient documentation

## 2018-07-07 MED ORDER — DEXTROSE 5 % IV SOLN
1500.0000 mg | Freq: Once | INTRAVENOUS | Status: AC
  Administered 2018-07-07: 1500 mg via INTRAVENOUS
  Filled 2018-07-07: qty 75

## 2018-07-10 DIAGNOSIS — Z87891 Personal history of nicotine dependence: Secondary | ICD-10-CM | POA: Diagnosis not present

## 2018-07-10 DIAGNOSIS — G82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, complete: Secondary | ICD-10-CM | POA: Diagnosis not present

## 2018-07-10 DIAGNOSIS — I10 Essential (primary) hypertension: Secondary | ICD-10-CM | POA: Diagnosis not present

## 2018-07-10 DIAGNOSIS — L89623 Pressure ulcer of left heel, stage 3: Secondary | ICD-10-CM | POA: Diagnosis not present

## 2018-07-10 DIAGNOSIS — L89324 Pressure ulcer of left buttock, stage 4: Secondary | ICD-10-CM | POA: Diagnosis not present

## 2018-07-10 DIAGNOSIS — L97522 Non-pressure chronic ulcer of other part of left foot with fat layer exposed: Secondary | ICD-10-CM | POA: Diagnosis not present

## 2018-07-10 DIAGNOSIS — G4730 Sleep apnea, unspecified: Secondary | ICD-10-CM | POA: Diagnosis not present

## 2018-07-10 DIAGNOSIS — L97512 Non-pressure chronic ulcer of other part of right foot with fat layer exposed: Secondary | ICD-10-CM | POA: Diagnosis not present

## 2018-07-11 DIAGNOSIS — L97509 Non-pressure chronic ulcer of other part of unspecified foot with unspecified severity: Secondary | ICD-10-CM | POA: Diagnosis not present

## 2018-07-11 DIAGNOSIS — G8220 Paraplegia, unspecified: Secondary | ICD-10-CM | POA: Diagnosis not present

## 2018-07-11 DIAGNOSIS — I890 Lymphedema, not elsewhere classified: Secondary | ICD-10-CM | POA: Diagnosis not present

## 2018-07-11 DIAGNOSIS — L97909 Non-pressure chronic ulcer of unspecified part of unspecified lower leg with unspecified severity: Secondary | ICD-10-CM | POA: Diagnosis not present

## 2018-07-18 DIAGNOSIS — L89623 Pressure ulcer of left heel, stage 3: Secondary | ICD-10-CM | POA: Diagnosis not present

## 2018-07-18 DIAGNOSIS — G4730 Sleep apnea, unspecified: Secondary | ICD-10-CM | POA: Diagnosis not present

## 2018-07-18 DIAGNOSIS — G82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, complete: Secondary | ICD-10-CM | POA: Diagnosis not present

## 2018-07-18 DIAGNOSIS — L89324 Pressure ulcer of left buttock, stage 4: Secondary | ICD-10-CM | POA: Diagnosis not present

## 2018-07-18 DIAGNOSIS — Z87891 Personal history of nicotine dependence: Secondary | ICD-10-CM | POA: Diagnosis not present

## 2018-07-18 DIAGNOSIS — I10 Essential (primary) hypertension: Secondary | ICD-10-CM | POA: Diagnosis not present

## 2018-07-18 DIAGNOSIS — L97522 Non-pressure chronic ulcer of other part of left foot with fat layer exposed: Secondary | ICD-10-CM | POA: Diagnosis not present

## 2018-07-18 DIAGNOSIS — L89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, stage 3: Secondary | ICD-10-CM | POA: Diagnosis not present

## 2018-07-18 DIAGNOSIS — L03116 Cellulitis of left lower limb: Secondary | ICD-10-CM | POA: Diagnosis not present

## 2018-07-18 DIAGNOSIS — L97512 Non-pressure chronic ulcer of other part of right foot with fat layer exposed: Secondary | ICD-10-CM | POA: Diagnosis not present

## 2018-07-25 ENCOUNTER — Encounter (HOSPITAL_BASED_OUTPATIENT_CLINIC_OR_DEPARTMENT_OTHER): Payer: BLUE CROSS/BLUE SHIELD | Attending: Internal Medicine

## 2018-07-25 DIAGNOSIS — G82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, complete: Secondary | ICD-10-CM | POA: Insufficient documentation

## 2018-07-25 DIAGNOSIS — L89623 Pressure ulcer of left heel, stage 3: Secondary | ICD-10-CM | POA: Diagnosis not present

## 2018-07-25 DIAGNOSIS — L97512 Non-pressure chronic ulcer of other part of right foot with fat layer exposed: Secondary | ICD-10-CM | POA: Diagnosis not present

## 2018-07-25 DIAGNOSIS — G4730 Sleep apnea, unspecified: Secondary | ICD-10-CM | POA: Insufficient documentation

## 2018-07-25 DIAGNOSIS — L89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, stage 3: Secondary | ICD-10-CM | POA: Insufficient documentation

## 2018-07-25 DIAGNOSIS — L97521 Non-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: Secondary | ICD-10-CM | POA: Insufficient documentation

## 2018-07-25 DIAGNOSIS — I10 Essential (primary) hypertension: Secondary | ICD-10-CM | POA: Insufficient documentation

## 2018-07-25 DIAGNOSIS — N390 Urinary tract infection, site not specified: Secondary | ICD-10-CM | POA: Insufficient documentation

## 2018-07-25 DIAGNOSIS — L89892 Pressure ulcer of other site, stage 2: Secondary | ICD-10-CM | POA: Insufficient documentation

## 2018-07-25 DIAGNOSIS — L97522 Non-pressure chronic ulcer of other part of left foot with fat layer exposed: Secondary | ICD-10-CM | POA: Diagnosis not present

## 2018-07-25 DIAGNOSIS — B353 Tinea pedis: Secondary | ICD-10-CM | POA: Insufficient documentation

## 2018-07-31 DIAGNOSIS — I10 Essential (primary) hypertension: Secondary | ICD-10-CM | POA: Diagnosis not present

## 2018-07-31 DIAGNOSIS — G82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, complete: Secondary | ICD-10-CM | POA: Diagnosis not present

## 2018-07-31 DIAGNOSIS — N390 Urinary tract infection, site not specified: Secondary | ICD-10-CM | POA: Diagnosis not present

## 2018-07-31 DIAGNOSIS — B353 Tinea pedis: Secondary | ICD-10-CM | POA: Diagnosis not present

## 2018-07-31 DIAGNOSIS — L97522 Non-pressure chronic ulcer of other part of left foot with fat layer exposed: Secondary | ICD-10-CM | POA: Diagnosis not present

## 2018-07-31 DIAGNOSIS — L89892 Pressure ulcer of other site, stage 2: Secondary | ICD-10-CM | POA: Diagnosis not present

## 2018-07-31 DIAGNOSIS — L97521 Non-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: Secondary | ICD-10-CM | POA: Diagnosis not present

## 2018-07-31 DIAGNOSIS — L89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, stage 3: Secondary | ICD-10-CM | POA: Diagnosis not present

## 2018-07-31 DIAGNOSIS — L97512 Non-pressure chronic ulcer of other part of right foot with fat layer exposed: Secondary | ICD-10-CM | POA: Diagnosis not present

## 2018-07-31 DIAGNOSIS — L89623 Pressure ulcer of left heel, stage 3: Secondary | ICD-10-CM | POA: Diagnosis not present

## 2018-07-31 DIAGNOSIS — G4730 Sleep apnea, unspecified: Secondary | ICD-10-CM | POA: Diagnosis not present

## 2018-08-03 DIAGNOSIS — Z6830 Body mass index (BMI) 30.0-30.9, adult: Secondary | ICD-10-CM | POA: Diagnosis not present

## 2018-08-03 DIAGNOSIS — R635 Abnormal weight gain: Secondary | ICD-10-CM | POA: Diagnosis not present

## 2018-08-07 DIAGNOSIS — I10 Essential (primary) hypertension: Secondary | ICD-10-CM | POA: Diagnosis not present

## 2018-08-07 DIAGNOSIS — L89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, stage 3: Secondary | ICD-10-CM | POA: Diagnosis not present

## 2018-08-07 DIAGNOSIS — N390 Urinary tract infection, site not specified: Secondary | ICD-10-CM | POA: Diagnosis not present

## 2018-08-07 DIAGNOSIS — L97522 Non-pressure chronic ulcer of other part of left foot with fat layer exposed: Secondary | ICD-10-CM | POA: Diagnosis not present

## 2018-08-07 DIAGNOSIS — L97512 Non-pressure chronic ulcer of other part of right foot with fat layer exposed: Secondary | ICD-10-CM | POA: Diagnosis not present

## 2018-08-07 DIAGNOSIS — B353 Tinea pedis: Secondary | ICD-10-CM | POA: Diagnosis not present

## 2018-08-07 DIAGNOSIS — G4730 Sleep apnea, unspecified: Secondary | ICD-10-CM | POA: Diagnosis not present

## 2018-08-07 DIAGNOSIS — L89892 Pressure ulcer of other site, stage 2: Secondary | ICD-10-CM | POA: Diagnosis not present

## 2018-08-07 DIAGNOSIS — L97521 Non-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: Secondary | ICD-10-CM | POA: Diagnosis not present

## 2018-08-07 DIAGNOSIS — L89623 Pressure ulcer of left heel, stage 3: Secondary | ICD-10-CM | POA: Diagnosis not present

## 2018-08-07 DIAGNOSIS — G82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, complete: Secondary | ICD-10-CM | POA: Diagnosis not present

## 2018-08-09 ENCOUNTER — Ambulatory Visit: Payer: BLUE CROSS/BLUE SHIELD | Admitting: Neurology

## 2018-08-10 DIAGNOSIS — Z79899 Other long term (current) drug therapy: Secondary | ICD-10-CM | POA: Diagnosis not present

## 2018-08-10 DIAGNOSIS — G8220 Paraplegia, unspecified: Secondary | ICD-10-CM | POA: Diagnosis not present

## 2018-08-10 DIAGNOSIS — Z87891 Personal history of nicotine dependence: Secondary | ICD-10-CM | POA: Diagnosis not present

## 2018-08-10 DIAGNOSIS — N3000 Acute cystitis without hematuria: Secondary | ICD-10-CM | POA: Diagnosis not present

## 2018-08-10 DIAGNOSIS — B9689 Other specified bacterial agents as the cause of diseases classified elsewhere: Secondary | ICD-10-CM | POA: Diagnosis not present

## 2018-08-10 DIAGNOSIS — R509 Fever, unspecified: Secondary | ICD-10-CM | POA: Diagnosis not present

## 2018-08-10 DIAGNOSIS — J9811 Atelectasis: Secondary | ICD-10-CM | POA: Diagnosis not present

## 2018-08-14 DIAGNOSIS — G82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, complete: Secondary | ICD-10-CM | POA: Diagnosis not present

## 2018-08-14 DIAGNOSIS — L89623 Pressure ulcer of left heel, stage 3: Secondary | ICD-10-CM | POA: Diagnosis not present

## 2018-08-14 DIAGNOSIS — B353 Tinea pedis: Secondary | ICD-10-CM | POA: Diagnosis not present

## 2018-08-14 DIAGNOSIS — I10 Essential (primary) hypertension: Secondary | ICD-10-CM | POA: Diagnosis not present

## 2018-08-14 DIAGNOSIS — L97521 Non-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: Secondary | ICD-10-CM | POA: Diagnosis not present

## 2018-08-14 DIAGNOSIS — L89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, stage 3: Secondary | ICD-10-CM | POA: Diagnosis not present

## 2018-08-14 DIAGNOSIS — G4730 Sleep apnea, unspecified: Secondary | ICD-10-CM | POA: Diagnosis not present

## 2018-08-14 DIAGNOSIS — N390 Urinary tract infection, site not specified: Secondary | ICD-10-CM | POA: Diagnosis not present

## 2018-08-14 DIAGNOSIS — L97512 Non-pressure chronic ulcer of other part of right foot with fat layer exposed: Secondary | ICD-10-CM | POA: Diagnosis not present

## 2018-08-14 DIAGNOSIS — L89892 Pressure ulcer of other site, stage 2: Secondary | ICD-10-CM | POA: Diagnosis not present

## 2018-08-14 DIAGNOSIS — L03116 Cellulitis of left lower limb: Secondary | ICD-10-CM | POA: Diagnosis not present

## 2018-08-21 DIAGNOSIS — L97521 Non-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: Secondary | ICD-10-CM | POA: Diagnosis not present

## 2018-08-21 DIAGNOSIS — L89623 Pressure ulcer of left heel, stage 3: Secondary | ICD-10-CM | POA: Diagnosis not present

## 2018-08-21 DIAGNOSIS — L89892 Pressure ulcer of other site, stage 2: Secondary | ICD-10-CM | POA: Diagnosis not present

## 2018-08-21 DIAGNOSIS — G4730 Sleep apnea, unspecified: Secondary | ICD-10-CM | POA: Diagnosis not present

## 2018-08-21 DIAGNOSIS — L97512 Non-pressure chronic ulcer of other part of right foot with fat layer exposed: Secondary | ICD-10-CM | POA: Diagnosis not present

## 2018-08-21 DIAGNOSIS — G82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, complete: Secondary | ICD-10-CM | POA: Diagnosis not present

## 2018-08-21 DIAGNOSIS — L97522 Non-pressure chronic ulcer of other part of left foot with fat layer exposed: Secondary | ICD-10-CM | POA: Diagnosis not present

## 2018-08-21 DIAGNOSIS — L89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, stage 3: Secondary | ICD-10-CM | POA: Diagnosis not present

## 2018-08-21 DIAGNOSIS — B353 Tinea pedis: Secondary | ICD-10-CM | POA: Diagnosis not present

## 2018-08-21 DIAGNOSIS — I10 Essential (primary) hypertension: Secondary | ICD-10-CM | POA: Diagnosis not present

## 2018-08-21 DIAGNOSIS — N390 Urinary tract infection, site not specified: Secondary | ICD-10-CM | POA: Diagnosis not present

## 2018-08-28 ENCOUNTER — Encounter (HOSPITAL_BASED_OUTPATIENT_CLINIC_OR_DEPARTMENT_OTHER): Payer: Self-pay

## 2018-08-28 ENCOUNTER — Encounter (HOSPITAL_BASED_OUTPATIENT_CLINIC_OR_DEPARTMENT_OTHER): Payer: BLUE CROSS/BLUE SHIELD | Attending: Internal Medicine

## 2018-08-28 DIAGNOSIS — L89623 Pressure ulcer of left heel, stage 3: Secondary | ICD-10-CM | POA: Diagnosis not present

## 2018-08-28 DIAGNOSIS — L97511 Non-pressure chronic ulcer of other part of right 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: Secondary | ICD-10-CM | POA: Diagnosis not present

## 2018-08-28 DIAGNOSIS — B353 Tinea pedis: Secondary | ICD-10-CM | POA: Diagnosis not present

## 2018-08-28 DIAGNOSIS — G82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, complete: Secondary | ICD-10-CM | POA: Insufficient documentation

## 2018-08-28 DIAGNOSIS — L03116 Cellulitis of left lower limb: Secondary | ICD-10-CM | POA: Insufficient documentation

## 2018-08-28 DIAGNOSIS — L89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, stage 3: Secondary | ICD-10-CM | POA: Insufficient documentation

## 2018-08-28 DIAGNOSIS — L97512 Non-pressure chronic ulcer of other part of right foot with fat layer exposed: Secondary | ICD-10-CM | POA: Diagnosis not present

## 2018-08-28 DIAGNOSIS — L89313 Pressure ulcer of right buttock, stage 3: Secondary | ICD-10-CM | POA: Diagnosis not present

## 2018-08-28 DIAGNOSIS — L97522 Non-pressure chronic ulcer of other part of left foot with fat layer exposed: Secondary | ICD-10-CM | POA: Diagnosis not present

## 2018-08-28 DIAGNOSIS — I10 Essential (primary) hypertension: Secondary | ICD-10-CM | POA: Diagnosis not present

## 2018-08-28 DIAGNOSIS — G4730 Sleep apnea, unspecified: Secondary | ICD-10-CM | POA: Insufficient documentation

## 2018-09-04 DIAGNOSIS — L89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, stage 3: Secondary | ICD-10-CM | POA: Diagnosis not present

## 2018-09-04 DIAGNOSIS — I10 Essential (primary) hypertension: Secondary | ICD-10-CM | POA: Diagnosis not present

## 2018-09-04 DIAGNOSIS — G82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, complete: Secondary | ICD-10-CM | POA: Diagnosis not present

## 2018-09-04 DIAGNOSIS — L97511 Non-pressure chronic ulcer of other part of right 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: Secondary | ICD-10-CM | POA: Diagnosis not present

## 2018-09-04 DIAGNOSIS — L03116 Cellulitis of left lower limb: Secondary | ICD-10-CM | POA: Diagnosis not present

## 2018-09-04 DIAGNOSIS — B353 Tinea pedis: Secondary | ICD-10-CM | POA: Diagnosis not present

## 2018-09-04 DIAGNOSIS — L89623 Pressure ulcer of left heel, stage 3: Secondary | ICD-10-CM | POA: Diagnosis not present

## 2018-09-04 DIAGNOSIS — L97522 Non-pressure chronic ulcer of other part of left foot with fat layer exposed: Secondary | ICD-10-CM | POA: Diagnosis not present

## 2018-09-04 DIAGNOSIS — G4730 Sleep apnea, unspecified: Secondary | ICD-10-CM | POA: Diagnosis not present

## 2018-09-05 ENCOUNTER — Other Ambulatory Visit (HOSPITAL_BASED_OUTPATIENT_CLINIC_OR_DEPARTMENT_OTHER): Payer: Self-pay | Admitting: Internal Medicine

## 2018-09-06 DIAGNOSIS — R635 Abnormal weight gain: Secondary | ICD-10-CM | POA: Diagnosis not present

## 2018-09-06 DIAGNOSIS — Z6830 Body mass index (BMI) 30.0-30.9, adult: Secondary | ICD-10-CM | POA: Diagnosis not present

## 2018-09-06 DIAGNOSIS — Z23 Encounter for immunization: Secondary | ICD-10-CM | POA: Diagnosis not present

## 2018-09-06 DIAGNOSIS — I10 Essential (primary) hypertension: Secondary | ICD-10-CM | POA: Diagnosis not present

## 2018-09-11 DIAGNOSIS — B353 Tinea pedis: Secondary | ICD-10-CM | POA: Diagnosis not present

## 2018-09-11 DIAGNOSIS — G4730 Sleep apnea, unspecified: Secondary | ICD-10-CM | POA: Diagnosis not present

## 2018-09-11 DIAGNOSIS — L03116 Cellulitis of left lower limb: Secondary | ICD-10-CM | POA: Diagnosis not present

## 2018-09-11 DIAGNOSIS — L97511 Non-pressure chronic ulcer of other part of right 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: Secondary | ICD-10-CM | POA: Diagnosis not present

## 2018-09-11 DIAGNOSIS — L89629 Pressure ulcer of left heel, unspecified stage: Secondary | ICD-10-CM | POA: Diagnosis not present

## 2018-09-11 DIAGNOSIS — G82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, complete: Secondary | ICD-10-CM | POA: Diagnosis not present

## 2018-09-11 DIAGNOSIS — L89899 Pressure ulcer of other site, unspecified stage: Secondary | ICD-10-CM | POA: Diagnosis not present

## 2018-09-11 DIAGNOSIS — L89623 Pressure ulcer of left heel, stage 3: Secondary | ICD-10-CM | POA: Diagnosis not present

## 2018-09-11 DIAGNOSIS — I10 Essential (primary) hypertension: Secondary | ICD-10-CM | POA: Diagnosis not present

## 2018-09-11 DIAGNOSIS — L97522 Non-pressure chronic ulcer of other part of left foot with fat layer exposed: Secondary | ICD-10-CM | POA: Diagnosis not present

## 2018-09-11 DIAGNOSIS — L89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, stage 3: Secondary | ICD-10-CM | POA: Diagnosis not present

## 2018-09-18 DIAGNOSIS — L97522 Non-pressure chronic ulcer of other part of left foot with fat layer exposed: Secondary | ICD-10-CM | POA: Diagnosis not present

## 2018-09-18 DIAGNOSIS — G82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, complete: Secondary | ICD-10-CM | POA: Diagnosis not present

## 2018-09-18 DIAGNOSIS — L89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, stage 3: Secondary | ICD-10-CM | POA: Diagnosis not present

## 2018-09-18 DIAGNOSIS — B353 Tinea pedis: Secondary | ICD-10-CM | POA: Diagnosis not present

## 2018-09-18 DIAGNOSIS — I10 Essential (primary) hypertension: Secondary | ICD-10-CM | POA: Diagnosis not present

## 2018-09-18 DIAGNOSIS — L89623 Pressure ulcer of left heel, stage 3: Secondary | ICD-10-CM | POA: Diagnosis not present

## 2018-09-18 DIAGNOSIS — G4730 Sleep apnea, unspecified: Secondary | ICD-10-CM | POA: Diagnosis not present

## 2018-09-18 DIAGNOSIS — L97511 Non-pressure chronic ulcer of other part of right 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: Secondary | ICD-10-CM | POA: Diagnosis not present

## 2018-09-18 DIAGNOSIS — L03116 Cellulitis of left lower limb: Secondary | ICD-10-CM | POA: Diagnosis not present

## 2018-09-25 ENCOUNTER — Encounter (HOSPITAL_BASED_OUTPATIENT_CLINIC_OR_DEPARTMENT_OTHER): Payer: BLUE CROSS/BLUE SHIELD | Attending: Internal Medicine

## 2018-09-25 DIAGNOSIS — L97522 Non-pressure chronic ulcer of other part of left foot with fat layer exposed: Secondary | ICD-10-CM | POA: Insufficient documentation

## 2018-09-25 DIAGNOSIS — L89623 Pressure ulcer of left heel, stage 3: Secondary | ICD-10-CM | POA: Diagnosis not present

## 2018-09-25 DIAGNOSIS — I10 Essential (primary) hypertension: Secondary | ICD-10-CM | POA: Insufficient documentation

## 2018-09-25 DIAGNOSIS — L89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, stage 3: Secondary | ICD-10-CM | POA: Diagnosis not present

## 2018-09-25 DIAGNOSIS — L97512 Non-pressure chronic ulcer of other part of right foot with fat layer exposed: Secondary | ICD-10-CM | POA: Insufficient documentation

## 2018-09-25 DIAGNOSIS — L89324 Pressure ulcer of left buttock, stage 4: Secondary | ICD-10-CM | POA: Diagnosis not present

## 2018-09-25 DIAGNOSIS — G82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, complete: Secondary | ICD-10-CM | POA: Diagnosis not present

## 2018-09-25 DIAGNOSIS — L89629 Pressure ulcer of left heel, unspecified stage: Secondary | ICD-10-CM | POA: Diagnosis not present

## 2018-09-25 DIAGNOSIS — G4730 Sleep apnea, unspecified: Secondary | ICD-10-CM | POA: Diagnosis not present

## 2018-09-25 DIAGNOSIS — L97822 Non-pressure chronic ulcer of other part of left lower leg with fat layer exposed: Secondary | ICD-10-CM | POA: Insufficient documentation

## 2018-09-25 DIAGNOSIS — L89893 Pressure ulcer of other site, stage 3: Secondary | ICD-10-CM | POA: Insufficient documentation

## 2018-09-25 DIAGNOSIS — L89899 Pressure ulcer of other site, unspecified stage: Secondary | ICD-10-CM | POA: Diagnosis not present

## 2018-09-25 DIAGNOSIS — L03116 Cellulitis of left lower limb: Secondary | ICD-10-CM | POA: Diagnosis not present

## 2018-10-02 DIAGNOSIS — L89324 Pressure ulcer of left buttock, stage 4: Secondary | ICD-10-CM | POA: Diagnosis not present

## 2018-10-02 DIAGNOSIS — L97822 Non-pressure chronic ulcer of other part of left lower leg with fat layer exposed: Secondary | ICD-10-CM | POA: Diagnosis not present

## 2018-10-02 DIAGNOSIS — L97522 Non-pressure chronic ulcer of other part of left foot with fat layer exposed: Secondary | ICD-10-CM | POA: Diagnosis not present

## 2018-10-02 DIAGNOSIS — I872 Venous insufficiency (chronic) (peripheral): Secondary | ICD-10-CM | POA: Diagnosis not present

## 2018-10-02 DIAGNOSIS — L97222 Non-pressure chronic ulcer of left calf with fat layer exposed: Secondary | ICD-10-CM | POA: Diagnosis not present

## 2018-10-02 DIAGNOSIS — L89623 Pressure ulcer of left heel, stage 3: Secondary | ICD-10-CM | POA: Diagnosis not present

## 2018-10-02 DIAGNOSIS — L97512 Non-pressure chronic ulcer of other part of right foot with fat layer exposed: Secondary | ICD-10-CM | POA: Diagnosis not present

## 2018-10-02 DIAGNOSIS — L89893 Pressure ulcer of other site, stage 3: Secondary | ICD-10-CM | POA: Diagnosis not present

## 2018-10-02 DIAGNOSIS — I10 Essential (primary) hypertension: Secondary | ICD-10-CM | POA: Diagnosis not present

## 2018-10-02 DIAGNOSIS — G82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, complete: Secondary | ICD-10-CM | POA: Diagnosis not present

## 2018-10-02 DIAGNOSIS — G4730 Sleep apnea, unspecified: Secondary | ICD-10-CM | POA: Diagnosis not present

## 2018-10-06 DIAGNOSIS — R635 Abnormal weight gain: Secondary | ICD-10-CM | POA: Diagnosis not present

## 2018-10-06 DIAGNOSIS — Z6830 Body mass index (BMI) 30.0-30.9, adult: Secondary | ICD-10-CM | POA: Diagnosis not present

## 2018-10-06 DIAGNOSIS — M62838 Other muscle spasm: Secondary | ICD-10-CM | POA: Diagnosis not present

## 2018-10-06 DIAGNOSIS — I10 Essential (primary) hypertension: Secondary | ICD-10-CM | POA: Diagnosis not present

## 2018-10-09 ENCOUNTER — Ambulatory Visit: Payer: BLUE CROSS/BLUE SHIELD | Admitting: Psychiatry

## 2018-10-09 DIAGNOSIS — G4700 Insomnia, unspecified: Secondary | ICD-10-CM | POA: Diagnosis not present

## 2018-10-09 DIAGNOSIS — F329 Major depressive disorder, single episode, unspecified: Secondary | ICD-10-CM | POA: Diagnosis not present

## 2018-10-09 DIAGNOSIS — L97522 Non-pressure chronic ulcer of other part of left foot with fat layer exposed: Secondary | ICD-10-CM | POA: Diagnosis not present

## 2018-10-09 DIAGNOSIS — S91301A Unspecified open wound, right foot, initial encounter: Secondary | ICD-10-CM | POA: Diagnosis not present

## 2018-10-09 DIAGNOSIS — L89623 Pressure ulcer of left heel, stage 3: Secondary | ICD-10-CM | POA: Diagnosis not present

## 2018-10-09 DIAGNOSIS — F32A Depression, unspecified: Secondary | ICD-10-CM

## 2018-10-09 DIAGNOSIS — G4730 Sleep apnea, unspecified: Secondary | ICD-10-CM | POA: Diagnosis not present

## 2018-10-09 DIAGNOSIS — S91302A Unspecified open wound, left foot, initial encounter: Secondary | ICD-10-CM | POA: Diagnosis not present

## 2018-10-09 DIAGNOSIS — L89629 Pressure ulcer of left heel, unspecified stage: Secondary | ICD-10-CM | POA: Diagnosis not present

## 2018-10-09 DIAGNOSIS — L89329 Pressure ulcer of left buttock, unspecified stage: Secondary | ICD-10-CM | POA: Diagnosis not present

## 2018-10-09 DIAGNOSIS — G82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, complete: Secondary | ICD-10-CM | POA: Diagnosis not present

## 2018-10-09 DIAGNOSIS — L97822 Non-pressure chronic ulcer of other part of left lower leg with fat layer exposed: Secondary | ICD-10-CM | POA: Diagnosis not present

## 2018-10-09 DIAGNOSIS — L97512 Non-pressure chronic ulcer of other part of right foot with fat layer exposed: Secondary | ICD-10-CM | POA: Diagnosis not present

## 2018-10-09 DIAGNOSIS — L89893 Pressure ulcer of other site, stage 3: Secondary | ICD-10-CM | POA: Diagnosis not present

## 2018-10-09 DIAGNOSIS — I10 Essential (primary) hypertension: Secondary | ICD-10-CM | POA: Diagnosis not present

## 2018-10-09 DIAGNOSIS — L89324 Pressure ulcer of left buttock, stage 4: Secondary | ICD-10-CM | POA: Diagnosis not present

## 2018-10-09 MED ORDER — SERTRALINE HCL 100 MG PO TABS: 100.0000 mg | ORAL_TABLET | Freq: Every day | ORAL | 5 refills | Status: DC

## 2018-10-09 NOTE — Progress Notes (Signed)
[facility]  Patient ID: [name],  MRN: [id number]  PCP: Janine Limbo, PA-C  Date of Evaluation: 10/09/2018  Time spent:20 minutes  Chief Complaint:   HISTORY/CURRENT STATUS: HPI 32 year old white male last seen 06/20/2018.  Diagnoses include depression and insomnia.  He was doing well at the time. He continues to do well.  Individual Medical History/ Review of Systems: Changes? :No   Allergies: Sulfa antibiotics; Levofloxacin; Meropenem; Penicillins; and Zyvox [linezolid]  Current Medications:  Current Outpatient Medications:  .  imipramine (TOFRANIL) 50 MG tablet, Take 50 mg by mouth at bedtime. , Disp: , Rfl:  .  phentermine 37.5 MG capsule, Take 37.5 mg by mouth every morning., Disp: , Rfl:  .  sertraline (ZOLOFT) 100 MG tablet, Take 1 tablet (100 mg total) by mouth at bedtime., Disp: 30 tablet, Rfl: 5 .  traZODone (DESYREL) 50 MG tablet, Take 50 mg by mouth at bedtime., Disp: , Rfl: 2 .  Amino Acids-Protein Hydrolys (FEEDING SUPPLEMENT, PRO-STAT SUGAR FREE 64,) LIQD, Take 30 mLs by mouth 2 (two) times daily., Disp: , Rfl:  .  Ascorbic Acid (VITAMIN C) 1000 MG tablet, Take 1,000 mg by mouth daily., Disp: , Rfl:  .  baclofen (LIORESAL) 20 MG tablet, Take 40-60 mg by mouth 3 (three) times daily. 60mg  in the morning, 40mg  in the afternoon, and 60mg  in the evening, Disp: , Rfl:  .  ketoconazole (NIZORAL) 2 % cream, APPLY TOPICALLY TO THE AFFECTED AREA WITH DRESSING CHANGES, Disp: , Rfl: 1 .  lisinopril-hydrochlorothiazide (PRINZIDE,ZESTORETIC) 20-25 MG per tablet, Take 1 tablet by mouth daily., Disp: , Rfl:  .  Melatonin 10 MG CAPS, Take by mouth. Nightly, Disp: , Rfl:  .  Multiple Vitamin (MULTIVITAMIN) tablet, Take 1 tablet by mouth daily., Disp: , Rfl:  .  nitrofurantoin, macrocrystal-monohydrate, (MACROBID) 100 MG capsule, TK 1 C PO BID, Disp: , Rfl: 0 .  oxybutynin (DITROPAN XL) 15 MG 24 hr tablet, Take 30 mg by mouth at bedtime. , Disp: , Rfl:  .  SANTYL  ointment, , Disp: , Rfl: 1 .  zinc sulfate 220 (50 Zn) MG capsule, Take 220 mg by mouth daily., Disp: , Rfl:  Medication Side Effects: none  Family Medical/ Social History: Changes? No  MENTAL HEALTH EXAM:  There were no vitals taken for this visit.There is no height or weight on file to calculate BMI.  General Appearance: Casual in wheelchair  Eye Contact:  Good  Speech:  Clear and Coherent and Garbled  Volume:  Normal  Mood:  Euthymic  Affect:  Appropriate  Thought Process:  Linear  Orientation:  Full (Time, Place, and Person)  Thought Content: Logical   Suicidal Thoughts:  No  Homicidal Thoughts:  No  Memory:  WNL  Judgement:  Good  Insight:  Good  Psychomotor Activity:  Normal  Concentration:  Concentration: Good  Recall:  Good  Fund of Knowledge: Good  Language: Good  Assets:  Desire for Improvement  ADL's:  Intact  Cognition: WNL  Prognosis:  Good    DIAGNOSES:    ICD-10-CM   1. Depression, unspecified depression type F32.9   2. Insomnia, unspecified type G47.00     Receiving Psychotherapy: No    RECOMMENDATIONS: Patient to continue on Zoloft 100 mg daily.  Patient to continue trazodone 50 mg at bedtime PRN.  Recheck 6 months.   Comer Locket, PA-C

## 2018-10-13 DIAGNOSIS — M7989 Other specified soft tissue disorders: Secondary | ICD-10-CM | POA: Diagnosis not present

## 2018-10-13 DIAGNOSIS — L97511 Non-pressure chronic ulcer of other part of right 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: Secondary | ICD-10-CM | POA: Diagnosis not present

## 2018-10-16 DIAGNOSIS — G82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, complete: Secondary | ICD-10-CM | POA: Diagnosis not present

## 2018-10-16 DIAGNOSIS — L97822 Non-pressure chronic ulcer of other part of left lower leg with fat layer exposed: Secondary | ICD-10-CM | POA: Diagnosis not present

## 2018-10-16 DIAGNOSIS — G4730 Sleep apnea, unspecified: Secondary | ICD-10-CM | POA: Diagnosis not present

## 2018-10-16 DIAGNOSIS — L97222 Non-pressure chronic ulcer of left calf with fat layer exposed: Secondary | ICD-10-CM | POA: Diagnosis not present

## 2018-10-16 DIAGNOSIS — L97522 Non-pressure chronic ulcer of other part of left foot with fat layer exposed: Secondary | ICD-10-CM | POA: Diagnosis not present

## 2018-10-16 DIAGNOSIS — I872 Venous insufficiency (chronic) (peripheral): Secondary | ICD-10-CM | POA: Diagnosis not present

## 2018-10-16 DIAGNOSIS — L89623 Pressure ulcer of left heel, stage 3: Secondary | ICD-10-CM | POA: Diagnosis not present

## 2018-10-16 DIAGNOSIS — L89893 Pressure ulcer of other site, stage 3: Secondary | ICD-10-CM | POA: Diagnosis not present

## 2018-10-16 DIAGNOSIS — I10 Essential (primary) hypertension: Secondary | ICD-10-CM | POA: Diagnosis not present

## 2018-10-16 DIAGNOSIS — L89324 Pressure ulcer of left buttock, stage 4: Secondary | ICD-10-CM | POA: Diagnosis not present

## 2018-10-16 DIAGNOSIS — L97512 Non-pressure chronic ulcer of other part of right foot with fat layer exposed: Secondary | ICD-10-CM | POA: Diagnosis not present

## 2018-10-23 ENCOUNTER — Encounter (HOSPITAL_BASED_OUTPATIENT_CLINIC_OR_DEPARTMENT_OTHER): Payer: BLUE CROSS/BLUE SHIELD

## 2018-10-23 ENCOUNTER — Encounter (HOSPITAL_BASED_OUTPATIENT_CLINIC_OR_DEPARTMENT_OTHER): Payer: BLUE CROSS/BLUE SHIELD | Attending: Internal Medicine

## 2018-10-23 DIAGNOSIS — L03116 Cellulitis of left lower limb: Secondary | ICD-10-CM | POA: Insufficient documentation

## 2018-10-23 DIAGNOSIS — I10 Essential (primary) hypertension: Secondary | ICD-10-CM | POA: Insufficient documentation

## 2018-10-23 DIAGNOSIS — L97522 Non-pressure chronic ulcer of other part of left foot with fat layer exposed: Secondary | ICD-10-CM | POA: Diagnosis not present

## 2018-10-23 DIAGNOSIS — Z8614 Personal history of Methicillin resistant Staphylococcus aureus infection: Secondary | ICD-10-CM | POA: Diagnosis not present

## 2018-10-23 DIAGNOSIS — S91301A Unspecified open wound, right foot, initial encounter: Secondary | ICD-10-CM | POA: Diagnosis not present

## 2018-10-23 DIAGNOSIS — L97222 Non-pressure chronic ulcer of left calf with fat layer exposed: Secondary | ICD-10-CM | POA: Diagnosis not present

## 2018-10-23 DIAGNOSIS — L97512 Non-pressure chronic ulcer of other part of right foot with fat layer exposed: Secondary | ICD-10-CM | POA: Insufficient documentation

## 2018-10-23 DIAGNOSIS — G82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, complete: Secondary | ICD-10-CM | POA: Insufficient documentation

## 2018-10-23 DIAGNOSIS — S91302A Unspecified open wound, left foot, initial encounter: Secondary | ICD-10-CM | POA: Diagnosis not present

## 2018-10-23 DIAGNOSIS — L538 Other specified erythematous conditions: Secondary | ICD-10-CM | POA: Diagnosis not present

## 2018-10-23 DIAGNOSIS — G4730 Sleep apnea, unspecified: Secondary | ICD-10-CM | POA: Diagnosis not present

## 2018-10-23 DIAGNOSIS — L89623 Pressure ulcer of left heel, stage 3: Secondary | ICD-10-CM | POA: Diagnosis not present

## 2018-10-26 DIAGNOSIS — I872 Venous insufficiency (chronic) (peripheral): Secondary | ICD-10-CM | POA: Diagnosis not present

## 2018-10-26 DIAGNOSIS — G4730 Sleep apnea, unspecified: Secondary | ICD-10-CM | POA: Diagnosis not present

## 2018-10-26 DIAGNOSIS — L03116 Cellulitis of left lower limb: Secondary | ICD-10-CM | POA: Diagnosis not present

## 2018-10-26 DIAGNOSIS — L97522 Non-pressure chronic ulcer of other part of left foot with fat layer exposed: Secondary | ICD-10-CM | POA: Diagnosis not present

## 2018-10-26 DIAGNOSIS — L539 Erythematous condition, unspecified: Secondary | ICD-10-CM | POA: Diagnosis not present

## 2018-10-26 DIAGNOSIS — L89623 Pressure ulcer of left heel, stage 3: Secondary | ICD-10-CM | POA: Diagnosis not present

## 2018-10-26 DIAGNOSIS — I10 Essential (primary) hypertension: Secondary | ICD-10-CM | POA: Diagnosis not present

## 2018-10-26 DIAGNOSIS — Z8614 Personal history of Methicillin resistant Staphylococcus aureus infection: Secondary | ICD-10-CM | POA: Diagnosis not present

## 2018-10-26 DIAGNOSIS — L97512 Non-pressure chronic ulcer of other part of right foot with fat layer exposed: Secondary | ICD-10-CM | POA: Diagnosis not present

## 2018-10-26 DIAGNOSIS — L97222 Non-pressure chronic ulcer of left calf with fat layer exposed: Secondary | ICD-10-CM | POA: Diagnosis not present

## 2018-10-26 DIAGNOSIS — L538 Other specified erythematous conditions: Secondary | ICD-10-CM | POA: Diagnosis not present

## 2018-10-26 DIAGNOSIS — G82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, complete: Secondary | ICD-10-CM | POA: Diagnosis not present

## 2018-10-30 DIAGNOSIS — L97222 Non-pressure chronic ulcer of left calf with fat layer exposed: Secondary | ICD-10-CM | POA: Diagnosis not present

## 2018-10-30 DIAGNOSIS — Z8614 Personal history of Methicillin resistant Staphylococcus aureus infection: Secondary | ICD-10-CM | POA: Diagnosis not present

## 2018-10-30 DIAGNOSIS — L03116 Cellulitis of left lower limb: Secondary | ICD-10-CM | POA: Diagnosis not present

## 2018-10-30 DIAGNOSIS — G4730 Sleep apnea, unspecified: Secondary | ICD-10-CM | POA: Diagnosis not present

## 2018-10-30 DIAGNOSIS — I10 Essential (primary) hypertension: Secondary | ICD-10-CM | POA: Diagnosis not present

## 2018-10-30 DIAGNOSIS — G82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, complete: Secondary | ICD-10-CM | POA: Diagnosis not present

## 2018-10-30 DIAGNOSIS — L89623 Pressure ulcer of left heel, stage 3: Secondary | ICD-10-CM | POA: Diagnosis not present

## 2018-10-30 DIAGNOSIS — L97522 Non-pressure chronic ulcer of other part of left foot with fat layer exposed: Secondary | ICD-10-CM | POA: Diagnosis not present

## 2018-10-30 DIAGNOSIS — L538 Other specified erythematous conditions: Secondary | ICD-10-CM | POA: Diagnosis not present

## 2018-10-30 DIAGNOSIS — L97512 Non-pressure chronic ulcer of other part of right foot with fat layer exposed: Secondary | ICD-10-CM | POA: Diagnosis not present

## 2018-10-30 DIAGNOSIS — I872 Venous insufficiency (chronic) (peripheral): Secondary | ICD-10-CM | POA: Diagnosis not present

## 2018-11-06 DIAGNOSIS — L97512 Non-pressure chronic ulcer of other part of right foot with fat layer exposed: Secondary | ICD-10-CM | POA: Diagnosis not present

## 2018-11-06 DIAGNOSIS — L03116 Cellulitis of left lower limb: Secondary | ICD-10-CM | POA: Diagnosis not present

## 2018-11-06 DIAGNOSIS — G4730 Sleep apnea, unspecified: Secondary | ICD-10-CM | POA: Diagnosis not present

## 2018-11-06 DIAGNOSIS — I872 Venous insufficiency (chronic) (peripheral): Secondary | ICD-10-CM | POA: Diagnosis not present

## 2018-11-06 DIAGNOSIS — S91302A Unspecified open wound, left foot, initial encounter: Secondary | ICD-10-CM | POA: Diagnosis not present

## 2018-11-06 DIAGNOSIS — G82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, complete: Secondary | ICD-10-CM | POA: Diagnosis not present

## 2018-11-06 DIAGNOSIS — L89623 Pressure ulcer of left heel, stage 3: Secondary | ICD-10-CM | POA: Diagnosis not present

## 2018-11-06 DIAGNOSIS — I10 Essential (primary) hypertension: Secondary | ICD-10-CM | POA: Diagnosis not present

## 2018-11-06 DIAGNOSIS — L97522 Non-pressure chronic ulcer of other part of left foot with fat layer exposed: Secondary | ICD-10-CM | POA: Diagnosis not present

## 2018-11-06 DIAGNOSIS — L538 Other specified erythematous conditions: Secondary | ICD-10-CM | POA: Diagnosis not present

## 2018-11-06 DIAGNOSIS — S91104A Unspecified open wound of right lesser toe(s) without damage to nail, initial encounter: Secondary | ICD-10-CM | POA: Diagnosis not present

## 2018-11-06 DIAGNOSIS — S91301A Unspecified open wound, right foot, initial encounter: Secondary | ICD-10-CM | POA: Diagnosis not present

## 2018-11-06 DIAGNOSIS — Z8614 Personal history of Methicillin resistant Staphylococcus aureus infection: Secondary | ICD-10-CM | POA: Diagnosis not present

## 2018-11-06 DIAGNOSIS — L97222 Non-pressure chronic ulcer of left calf with fat layer exposed: Secondary | ICD-10-CM | POA: Diagnosis not present

## 2018-11-07 ENCOUNTER — Ambulatory Visit: Payer: BLUE CROSS/BLUE SHIELD | Admitting: Neurology

## 2018-11-10 DIAGNOSIS — L97222 Non-pressure chronic ulcer of left calf with fat layer exposed: Secondary | ICD-10-CM | POA: Diagnosis not present

## 2018-11-10 DIAGNOSIS — L89623 Pressure ulcer of left heel, stage 3: Secondary | ICD-10-CM | POA: Diagnosis not present

## 2018-11-10 DIAGNOSIS — L03116 Cellulitis of left lower limb: Secondary | ICD-10-CM | POA: Diagnosis not present

## 2018-11-10 DIAGNOSIS — L97522 Non-pressure chronic ulcer of other part of left foot with fat layer exposed: Secondary | ICD-10-CM | POA: Diagnosis not present

## 2018-11-10 DIAGNOSIS — G82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, complete: Secondary | ICD-10-CM | POA: Diagnosis not present

## 2018-11-10 DIAGNOSIS — I10 Essential (primary) hypertension: Secondary | ICD-10-CM | POA: Diagnosis not present

## 2018-11-10 DIAGNOSIS — G4730 Sleep apnea, unspecified: Secondary | ICD-10-CM | POA: Diagnosis not present

## 2018-11-10 DIAGNOSIS — Z8614 Personal history of Methicillin resistant Staphylococcus aureus infection: Secondary | ICD-10-CM | POA: Diagnosis not present

## 2018-11-10 DIAGNOSIS — L97512 Non-pressure chronic ulcer of other part of right foot with fat layer exposed: Secondary | ICD-10-CM | POA: Diagnosis not present

## 2018-11-10 DIAGNOSIS — L538 Other specified erythematous conditions: Secondary | ICD-10-CM | POA: Diagnosis not present

## 2018-11-13 DIAGNOSIS — Z8614 Personal history of Methicillin resistant Staphylococcus aureus infection: Secondary | ICD-10-CM | POA: Diagnosis not present

## 2018-11-13 DIAGNOSIS — L89623 Pressure ulcer of left heel, stage 3: Secondary | ICD-10-CM | POA: Diagnosis not present

## 2018-11-13 DIAGNOSIS — L97222 Non-pressure chronic ulcer of left calf with fat layer exposed: Secondary | ICD-10-CM | POA: Diagnosis not present

## 2018-11-13 DIAGNOSIS — G82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, complete: Secondary | ICD-10-CM | POA: Diagnosis not present

## 2018-11-13 DIAGNOSIS — I10 Essential (primary) hypertension: Secondary | ICD-10-CM | POA: Diagnosis not present

## 2018-11-13 DIAGNOSIS — S91302A Unspecified open wound, left foot, initial encounter: Secondary | ICD-10-CM | POA: Diagnosis not present

## 2018-11-13 DIAGNOSIS — L538 Other specified erythematous conditions: Secondary | ICD-10-CM | POA: Diagnosis not present

## 2018-11-13 DIAGNOSIS — G4730 Sleep apnea, unspecified: Secondary | ICD-10-CM | POA: Diagnosis not present

## 2018-11-13 DIAGNOSIS — L97522 Non-pressure chronic ulcer of other part of left foot with fat layer exposed: Secondary | ICD-10-CM | POA: Diagnosis not present

## 2018-11-13 DIAGNOSIS — S91301A Unspecified open wound, right foot, initial encounter: Secondary | ICD-10-CM | POA: Diagnosis not present

## 2018-11-13 DIAGNOSIS — L97512 Non-pressure chronic ulcer of other part of right foot with fat layer exposed: Secondary | ICD-10-CM | POA: Diagnosis not present

## 2018-11-13 DIAGNOSIS — L03116 Cellulitis of left lower limb: Secondary | ICD-10-CM | POA: Diagnosis not present

## 2018-11-16 DIAGNOSIS — L03116 Cellulitis of left lower limb: Secondary | ICD-10-CM | POA: Diagnosis not present

## 2018-11-16 DIAGNOSIS — L538 Other specified erythematous conditions: Secondary | ICD-10-CM | POA: Diagnosis not present

## 2018-11-16 DIAGNOSIS — L97222 Non-pressure chronic ulcer of left calf with fat layer exposed: Secondary | ICD-10-CM | POA: Diagnosis not present

## 2018-11-16 DIAGNOSIS — I10 Essential (primary) hypertension: Secondary | ICD-10-CM | POA: Diagnosis not present

## 2018-11-16 DIAGNOSIS — L89623 Pressure ulcer of left heel, stage 3: Secondary | ICD-10-CM | POA: Diagnosis not present

## 2018-11-16 DIAGNOSIS — G4730 Sleep apnea, unspecified: Secondary | ICD-10-CM | POA: Diagnosis not present

## 2018-11-16 DIAGNOSIS — L97512 Non-pressure chronic ulcer of other part of right foot with fat layer exposed: Secondary | ICD-10-CM | POA: Diagnosis not present

## 2018-11-16 DIAGNOSIS — G82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, complete: Secondary | ICD-10-CM | POA: Diagnosis not present

## 2018-11-16 DIAGNOSIS — Z8614 Personal history of Methicillin resistant Staphylococcus aureus infection: Secondary | ICD-10-CM | POA: Diagnosis not present

## 2018-11-16 DIAGNOSIS — L97522 Non-pressure chronic ulcer of other part of left foot with fat layer exposed: Secondary | ICD-10-CM | POA: Diagnosis not present

## 2018-11-17 DIAGNOSIS — I10 Essential (primary) hypertension: Secondary | ICD-10-CM | POA: Diagnosis not present

## 2018-11-17 DIAGNOSIS — R635 Abnormal weight gain: Secondary | ICD-10-CM | POA: Diagnosis not present

## 2018-11-17 DIAGNOSIS — Z6830 Body mass index (BMI) 30.0-30.9, adult: Secondary | ICD-10-CM | POA: Diagnosis not present

## 2018-11-20 DIAGNOSIS — I890 Lymphedema, not elsewhere classified: Secondary | ICD-10-CM | POA: Diagnosis not present

## 2018-11-20 DIAGNOSIS — L538 Other specified erythematous conditions: Secondary | ICD-10-CM | POA: Diagnosis not present

## 2018-11-20 DIAGNOSIS — L97512 Non-pressure chronic ulcer of other part of right foot with fat layer exposed: Secondary | ICD-10-CM | POA: Diagnosis not present

## 2018-11-20 DIAGNOSIS — G82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, complete: Secondary | ICD-10-CM | POA: Diagnosis not present

## 2018-11-20 DIAGNOSIS — L03116 Cellulitis of left lower limb: Secondary | ICD-10-CM | POA: Diagnosis not present

## 2018-11-20 DIAGNOSIS — L97222 Non-pressure chronic ulcer of left calf with fat layer exposed: Secondary | ICD-10-CM | POA: Diagnosis not present

## 2018-11-20 DIAGNOSIS — L89623 Pressure ulcer of left heel, stage 3: Secondary | ICD-10-CM | POA: Diagnosis not present

## 2018-11-20 DIAGNOSIS — I10 Essential (primary) hypertension: Secondary | ICD-10-CM | POA: Diagnosis not present

## 2018-11-20 DIAGNOSIS — G4730 Sleep apnea, unspecified: Secondary | ICD-10-CM | POA: Diagnosis not present

## 2018-11-20 DIAGNOSIS — S91104A Unspecified open wound of right lesser toe(s) without damage to nail, initial encounter: Secondary | ICD-10-CM | POA: Diagnosis not present

## 2018-11-20 DIAGNOSIS — Z8614 Personal history of Methicillin resistant Staphylococcus aureus infection: Secondary | ICD-10-CM | POA: Diagnosis not present

## 2018-11-20 DIAGNOSIS — S91302A Unspecified open wound, left foot, initial encounter: Secondary | ICD-10-CM | POA: Diagnosis not present

## 2018-11-20 DIAGNOSIS — L089 Local infection of the skin and subcutaneous tissue, unspecified: Secondary | ICD-10-CM | POA: Diagnosis not present

## 2018-11-20 DIAGNOSIS — L97522 Non-pressure chronic ulcer of other part of left foot with fat layer exposed: Secondary | ICD-10-CM | POA: Diagnosis not present

## 2018-11-27 ENCOUNTER — Encounter (HOSPITAL_BASED_OUTPATIENT_CLINIC_OR_DEPARTMENT_OTHER): Payer: BLUE CROSS/BLUE SHIELD | Attending: Internal Medicine

## 2018-11-27 DIAGNOSIS — L97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: Secondary | ICD-10-CM | POA: Insufficient documentation

## 2018-11-27 DIAGNOSIS — L89623 Pressure ulcer of left heel, stage 3: Secondary | ICD-10-CM | POA: Insufficient documentation

## 2018-11-27 DIAGNOSIS — I10 Essential (primary) hypertension: Secondary | ICD-10-CM | POA: Diagnosis not present

## 2018-11-27 DIAGNOSIS — G4730 Sleep apnea, unspecified: Secondary | ICD-10-CM | POA: Insufficient documentation

## 2018-11-27 DIAGNOSIS — L89322 Pressure ulcer of left buttock, stage 2: Secondary | ICD-10-CM | POA: Diagnosis not present

## 2018-11-27 DIAGNOSIS — G8220 Paraplegia, unspecified: Secondary | ICD-10-CM | POA: Insufficient documentation

## 2018-11-27 DIAGNOSIS — I872 Venous insufficiency (chronic) (peripheral): Secondary | ICD-10-CM | POA: Diagnosis not present

## 2018-11-27 DIAGNOSIS — L97222 Non-pressure chronic ulcer of left calf with fat layer exposed: Secondary | ICD-10-CM | POA: Diagnosis not present

## 2018-11-27 DIAGNOSIS — L97511 Non-pressure chronic ulcer of other part of right 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: Secondary | ICD-10-CM | POA: Diagnosis not present

## 2018-11-27 DIAGNOSIS — L97521 Non-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: Secondary | ICD-10-CM | POA: Diagnosis not present

## 2018-11-27 DIAGNOSIS — L97522 Non-pressure chronic ulcer of other part of left foot with fat layer exposed: Secondary | ICD-10-CM | POA: Diagnosis not present

## 2018-11-28 ENCOUNTER — Encounter (HOSPITAL_BASED_OUTPATIENT_CLINIC_OR_DEPARTMENT_OTHER): Payer: BLUE CROSS/BLUE SHIELD

## 2018-11-30 DIAGNOSIS — L89322 Pressure ulcer of left buttock, stage 2: Secondary | ICD-10-CM | POA: Diagnosis not present

## 2018-11-30 DIAGNOSIS — L89623 Pressure ulcer of left heel, stage 3: Secondary | ICD-10-CM | POA: Diagnosis not present

## 2018-11-30 DIAGNOSIS — G4730 Sleep apnea, unspecified: Secondary | ICD-10-CM | POA: Diagnosis not present

## 2018-11-30 DIAGNOSIS — L97521 Non-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: Secondary | ICD-10-CM | POA: Diagnosis not present

## 2018-11-30 DIAGNOSIS — L97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: Secondary | ICD-10-CM | POA: Diagnosis not present

## 2018-11-30 DIAGNOSIS — G8220 Paraplegia, unspecified: Secondary | ICD-10-CM | POA: Diagnosis not present

## 2018-11-30 DIAGNOSIS — L97511 Non-pressure chronic ulcer of other part of right 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: Secondary | ICD-10-CM | POA: Diagnosis not present

## 2018-11-30 DIAGNOSIS — I10 Essential (primary) hypertension: Secondary | ICD-10-CM | POA: Diagnosis not present

## 2018-12-04 DIAGNOSIS — I10 Essential (primary) hypertension: Secondary | ICD-10-CM | POA: Diagnosis not present

## 2018-12-04 DIAGNOSIS — L89322 Pressure ulcer of left buttock, stage 2: Secondary | ICD-10-CM | POA: Diagnosis not present

## 2018-12-04 DIAGNOSIS — L97519 Non-pressure chronic ulcer of other part of right foot with unspecified severity: Secondary | ICD-10-CM | POA: Diagnosis not present

## 2018-12-04 DIAGNOSIS — I872 Venous insufficiency (chronic) (peripheral): Secondary | ICD-10-CM | POA: Diagnosis not present

## 2018-12-04 DIAGNOSIS — L89623 Pressure ulcer of left heel, stage 3: Secondary | ICD-10-CM | POA: Diagnosis not present

## 2018-12-04 DIAGNOSIS — G8220 Paraplegia, unspecified: Secondary | ICD-10-CM | POA: Diagnosis not present

## 2018-12-04 DIAGNOSIS — G4730 Sleep apnea, unspecified: Secondary | ICD-10-CM | POA: Diagnosis not present

## 2018-12-04 DIAGNOSIS — L97222 Non-pressure chronic ulcer of left calf with fat layer exposed: Secondary | ICD-10-CM | POA: Diagnosis not present

## 2018-12-04 DIAGNOSIS — L97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: Secondary | ICD-10-CM | POA: Diagnosis not present

## 2018-12-04 DIAGNOSIS — L97521 Non-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: Secondary | ICD-10-CM | POA: Diagnosis not present

## 2018-12-04 DIAGNOSIS — L97522 Non-pressure chronic ulcer of other part of left foot with fat layer exposed: Secondary | ICD-10-CM | POA: Diagnosis not present

## 2018-12-04 DIAGNOSIS — L97511 Non-pressure chronic ulcer of other part of right 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: Secondary | ICD-10-CM | POA: Diagnosis not present

## 2018-12-11 DIAGNOSIS — L89322 Pressure ulcer of left buttock, stage 2: Secondary | ICD-10-CM | POA: Diagnosis not present

## 2018-12-11 DIAGNOSIS — I10 Essential (primary) hypertension: Secondary | ICD-10-CM | POA: Diagnosis not present

## 2018-12-11 DIAGNOSIS — L97521 Non-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: Secondary | ICD-10-CM | POA: Diagnosis not present

## 2018-12-11 DIAGNOSIS — I872 Venous insufficiency (chronic) (peripheral): Secondary | ICD-10-CM | POA: Diagnosis not present

## 2018-12-11 DIAGNOSIS — L97519 Non-pressure chronic ulcer of other part of right foot with unspecified severity: Secondary | ICD-10-CM | POA: Diagnosis not present

## 2018-12-11 DIAGNOSIS — L97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: Secondary | ICD-10-CM | POA: Diagnosis not present

## 2018-12-11 DIAGNOSIS — L97522 Non-pressure chronic ulcer of other part of left foot with fat layer exposed: Secondary | ICD-10-CM | POA: Diagnosis not present

## 2018-12-11 DIAGNOSIS — L97511 Non-pressure chronic ulcer of other part of right 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: Secondary | ICD-10-CM | POA: Diagnosis not present

## 2018-12-11 DIAGNOSIS — G8220 Paraplegia, unspecified: Secondary | ICD-10-CM | POA: Diagnosis not present

## 2018-12-11 DIAGNOSIS — L89623 Pressure ulcer of left heel, stage 3: Secondary | ICD-10-CM | POA: Diagnosis not present

## 2018-12-11 DIAGNOSIS — L97222 Non-pressure chronic ulcer of left calf with fat layer exposed: Secondary | ICD-10-CM | POA: Diagnosis not present

## 2018-12-11 DIAGNOSIS — G4730 Sleep apnea, unspecified: Secondary | ICD-10-CM | POA: Diagnosis not present

## 2018-12-18 DIAGNOSIS — I872 Venous insufficiency (chronic) (peripheral): Secondary | ICD-10-CM | POA: Diagnosis not present

## 2018-12-18 DIAGNOSIS — L97511 Non-pressure chronic ulcer of other part of right 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: Secondary | ICD-10-CM | POA: Diagnosis not present

## 2018-12-18 DIAGNOSIS — L97521 Non-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: Secondary | ICD-10-CM | POA: Diagnosis not present

## 2018-12-18 DIAGNOSIS — G4730 Sleep apnea, unspecified: Secondary | ICD-10-CM | POA: Diagnosis not present

## 2018-12-18 DIAGNOSIS — L97222 Non-pressure chronic ulcer of left calf with fat layer exposed: Secondary | ICD-10-CM | POA: Diagnosis not present

## 2018-12-18 DIAGNOSIS — L89322 Pressure ulcer of left buttock, stage 2: Secondary | ICD-10-CM | POA: Diagnosis not present

## 2018-12-18 DIAGNOSIS — S91302A Unspecified open wound, left foot, initial encounter: Secondary | ICD-10-CM | POA: Diagnosis not present

## 2018-12-18 DIAGNOSIS — L89629 Pressure ulcer of left heel, unspecified stage: Secondary | ICD-10-CM | POA: Diagnosis not present

## 2018-12-18 DIAGNOSIS — I10 Essential (primary) hypertension: Secondary | ICD-10-CM | POA: Diagnosis not present

## 2018-12-18 DIAGNOSIS — G8220 Paraplegia, unspecified: Secondary | ICD-10-CM | POA: Diagnosis not present

## 2018-12-18 DIAGNOSIS — L89623 Pressure ulcer of left heel, stage 3: Secondary | ICD-10-CM | POA: Diagnosis not present

## 2018-12-18 DIAGNOSIS — L97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: Secondary | ICD-10-CM | POA: Diagnosis not present

## 2018-12-18 DIAGNOSIS — B353 Tinea pedis: Secondary | ICD-10-CM | POA: Diagnosis not present

## 2018-12-19 DIAGNOSIS — R350 Frequency of micturition: Secondary | ICD-10-CM | POA: Diagnosis not present

## 2018-12-19 DIAGNOSIS — Z6829 Body mass index (BMI) 29.0-29.9, adult: Secondary | ICD-10-CM | POA: Diagnosis not present

## 2018-12-19 DIAGNOSIS — I10 Essential (primary) hypertension: Secondary | ICD-10-CM | POA: Diagnosis not present

## 2018-12-19 DIAGNOSIS — R635 Abnormal weight gain: Secondary | ICD-10-CM | POA: Diagnosis not present

## 2018-12-25 ENCOUNTER — Encounter (HOSPITAL_BASED_OUTPATIENT_CLINIC_OR_DEPARTMENT_OTHER): Payer: BLUE CROSS/BLUE SHIELD | Attending: Internal Medicine

## 2018-12-25 DIAGNOSIS — L97822 Non-pressure chronic ulcer of other part of left lower leg with fat layer exposed: Secondary | ICD-10-CM | POA: Insufficient documentation

## 2018-12-25 DIAGNOSIS — L89322 Pressure ulcer of left buttock, stage 2: Secondary | ICD-10-CM | POA: Diagnosis not present

## 2018-12-25 DIAGNOSIS — I872 Venous insufficiency (chronic) (peripheral): Secondary | ICD-10-CM | POA: Diagnosis not present

## 2018-12-25 DIAGNOSIS — L97519 Non-pressure chronic ulcer of other part of right foot with unspecified severity: Secondary | ICD-10-CM | POA: Diagnosis not present

## 2018-12-25 DIAGNOSIS — L97222 Non-pressure chronic ulcer of left calf with fat layer exposed: Secondary | ICD-10-CM | POA: Diagnosis not present

## 2018-12-25 DIAGNOSIS — I890 Lymphedema, not elsewhere classified: Secondary | ICD-10-CM | POA: Diagnosis not present

## 2018-12-25 DIAGNOSIS — G82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, complete: Secondary | ICD-10-CM | POA: Insufficient documentation

## 2018-12-25 DIAGNOSIS — I10 Essential (primary) hypertension: Secondary | ICD-10-CM | POA: Insufficient documentation

## 2018-12-25 DIAGNOSIS — L97522 Non-pressure chronic ulcer of other part of left foot with fat layer exposed: Secondary | ICD-10-CM | POA: Diagnosis not present

## 2018-12-25 DIAGNOSIS — G4730 Sleep apnea, unspecified: Secondary | ICD-10-CM | POA: Diagnosis not present

## 2018-12-25 DIAGNOSIS — L97512 Non-pressure chronic ulcer of other part of right foot with fat layer exposed: Secondary | ICD-10-CM | POA: Diagnosis not present

## 2018-12-26 ENCOUNTER — Encounter: Payer: Self-pay | Admitting: Emergency Medicine

## 2018-12-26 DIAGNOSIS — G4700 Insomnia, unspecified: Secondary | ICD-10-CM | POA: Insufficient documentation

## 2018-12-26 DIAGNOSIS — F341 Dysthymic disorder: Secondary | ICD-10-CM | POA: Insufficient documentation

## 2018-12-26 DIAGNOSIS — F329 Major depressive disorder, single episode, unspecified: Secondary | ICD-10-CM

## 2019-01-01 DIAGNOSIS — I10 Essential (primary) hypertension: Secondary | ICD-10-CM | POA: Diagnosis not present

## 2019-01-01 DIAGNOSIS — G82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, complete: Secondary | ICD-10-CM | POA: Diagnosis not present

## 2019-01-01 DIAGNOSIS — L89322 Pressure ulcer of left buttock, stage 2: Secondary | ICD-10-CM | POA: Diagnosis not present

## 2019-01-01 DIAGNOSIS — L97222 Non-pressure chronic ulcer of left calf with fat layer exposed: Secondary | ICD-10-CM | POA: Diagnosis not present

## 2019-01-01 DIAGNOSIS — L97522 Non-pressure chronic ulcer of other part of left foot with fat layer exposed: Secondary | ICD-10-CM | POA: Diagnosis not present

## 2019-01-01 DIAGNOSIS — L97519 Non-pressure chronic ulcer of other part of right foot with unspecified severity: Secondary | ICD-10-CM | POA: Diagnosis not present

## 2019-01-01 DIAGNOSIS — L97512 Non-pressure chronic ulcer of other part of right foot with fat layer exposed: Secondary | ICD-10-CM | POA: Diagnosis not present

## 2019-01-01 DIAGNOSIS — I890 Lymphedema, not elsewhere classified: Secondary | ICD-10-CM | POA: Diagnosis not present

## 2019-01-01 DIAGNOSIS — L97822 Non-pressure chronic ulcer of other part of left lower leg with fat layer exposed: Secondary | ICD-10-CM | POA: Diagnosis not present

## 2019-01-01 DIAGNOSIS — G4730 Sleep apnea, unspecified: Secondary | ICD-10-CM | POA: Diagnosis not present

## 2019-01-08 DIAGNOSIS — L97922 Non-pressure chronic ulcer of unspecified part of left lower leg with fat layer exposed: Secondary | ICD-10-CM | POA: Diagnosis not present

## 2019-01-08 DIAGNOSIS — L89322 Pressure ulcer of left buttock, stage 2: Secondary | ICD-10-CM | POA: Diagnosis not present

## 2019-01-08 DIAGNOSIS — L97512 Non-pressure chronic ulcer of other part of right foot with fat layer exposed: Secondary | ICD-10-CM | POA: Diagnosis not present

## 2019-01-08 DIAGNOSIS — I10 Essential (primary) hypertension: Secondary | ICD-10-CM | POA: Diagnosis not present

## 2019-01-08 DIAGNOSIS — S31829A Unspecified open wound of left buttock, initial encounter: Secondary | ICD-10-CM | POA: Diagnosis not present

## 2019-01-08 DIAGNOSIS — I872 Venous insufficiency (chronic) (peripheral): Secondary | ICD-10-CM | POA: Diagnosis not present

## 2019-01-08 DIAGNOSIS — G4730 Sleep apnea, unspecified: Secondary | ICD-10-CM | POA: Diagnosis not present

## 2019-01-08 DIAGNOSIS — L97222 Non-pressure chronic ulcer of left calf with fat layer exposed: Secondary | ICD-10-CM | POA: Diagnosis not present

## 2019-01-08 DIAGNOSIS — L97822 Non-pressure chronic ulcer of other part of left lower leg with fat layer exposed: Secondary | ICD-10-CM | POA: Diagnosis not present

## 2019-01-08 DIAGNOSIS — G82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, complete: Secondary | ICD-10-CM | POA: Diagnosis not present

## 2019-01-08 DIAGNOSIS — I890 Lymphedema, not elsewhere classified: Secondary | ICD-10-CM | POA: Diagnosis not present

## 2019-01-08 DIAGNOSIS — L97522 Non-pressure chronic ulcer of other part of left foot with fat layer exposed: Secondary | ICD-10-CM | POA: Diagnosis not present

## 2019-01-08 DIAGNOSIS — L97519 Non-pressure chronic ulcer of other part of right foot with unspecified severity: Secondary | ICD-10-CM | POA: Diagnosis not present

## 2019-01-15 DIAGNOSIS — L97222 Non-pressure chronic ulcer of left calf with fat layer exposed: Secondary | ICD-10-CM | POA: Diagnosis not present

## 2019-01-15 DIAGNOSIS — L97519 Non-pressure chronic ulcer of other part of right foot with unspecified severity: Secondary | ICD-10-CM | POA: Diagnosis not present

## 2019-01-15 DIAGNOSIS — L97522 Non-pressure chronic ulcer of other part of left foot with fat layer exposed: Secondary | ICD-10-CM | POA: Diagnosis not present

## 2019-01-15 DIAGNOSIS — I890 Lymphedema, not elsewhere classified: Secondary | ICD-10-CM | POA: Diagnosis not present

## 2019-01-15 DIAGNOSIS — I10 Essential (primary) hypertension: Secondary | ICD-10-CM | POA: Diagnosis not present

## 2019-01-15 DIAGNOSIS — I872 Venous insufficiency (chronic) (peripheral): Secondary | ICD-10-CM | POA: Diagnosis not present

## 2019-01-15 DIAGNOSIS — G82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, complete: Secondary | ICD-10-CM | POA: Diagnosis not present

## 2019-01-15 DIAGNOSIS — L89322 Pressure ulcer of left buttock, stage 2: Secondary | ICD-10-CM | POA: Diagnosis not present

## 2019-01-15 DIAGNOSIS — G4730 Sleep apnea, unspecified: Secondary | ICD-10-CM | POA: Diagnosis not present

## 2019-01-15 DIAGNOSIS — L97822 Non-pressure chronic ulcer of other part of left lower leg with fat layer exposed: Secondary | ICD-10-CM | POA: Diagnosis not present

## 2019-01-15 DIAGNOSIS — L97512 Non-pressure chronic ulcer of other part of right foot with fat layer exposed: Secondary | ICD-10-CM | POA: Diagnosis not present

## 2019-01-19 DIAGNOSIS — Z6829 Body mass index (BMI) 29.0-29.9, adult: Secondary | ICD-10-CM | POA: Diagnosis not present

## 2019-01-19 DIAGNOSIS — I10 Essential (primary) hypertension: Secondary | ICD-10-CM | POA: Diagnosis not present

## 2019-01-19 DIAGNOSIS — R635 Abnormal weight gain: Secondary | ICD-10-CM | POA: Diagnosis not present

## 2019-01-19 DIAGNOSIS — M62838 Other muscle spasm: Secondary | ICD-10-CM | POA: Diagnosis not present

## 2019-01-22 ENCOUNTER — Encounter (HOSPITAL_BASED_OUTPATIENT_CLINIC_OR_DEPARTMENT_OTHER): Payer: BLUE CROSS/BLUE SHIELD | Attending: Internal Medicine

## 2019-01-22 DIAGNOSIS — G4730 Sleep apnea, unspecified: Secondary | ICD-10-CM | POA: Insufficient documentation

## 2019-01-22 DIAGNOSIS — L97822 Non-pressure chronic ulcer of other part of left lower leg with fat layer exposed: Secondary | ICD-10-CM | POA: Insufficient documentation

## 2019-01-22 DIAGNOSIS — L97511 Non-pressure chronic ulcer of other part of right 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: Secondary | ICD-10-CM | POA: Diagnosis not present

## 2019-01-22 DIAGNOSIS — I10 Essential (primary) hypertension: Secondary | ICD-10-CM | POA: Insufficient documentation

## 2019-01-22 DIAGNOSIS — I872 Venous insufficiency (chronic) (peripheral): Secondary | ICD-10-CM | POA: Diagnosis not present

## 2019-01-22 DIAGNOSIS — L97522 Non-pressure chronic ulcer of other part of left foot with fat layer exposed: Secondary | ICD-10-CM | POA: Insufficient documentation

## 2019-01-22 DIAGNOSIS — G82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, complete: Secondary | ICD-10-CM | POA: Diagnosis not present

## 2019-01-22 DIAGNOSIS — L89322 Pressure ulcer of left buttock, stage 2: Secondary | ICD-10-CM | POA: Insufficient documentation

## 2019-01-22 DIAGNOSIS — L97222 Non-pressure chronic ulcer of left calf with fat layer exposed: Secondary | ICD-10-CM | POA: Diagnosis not present

## 2019-01-29 DIAGNOSIS — S91302A Unspecified open wound, left foot, initial encounter: Secondary | ICD-10-CM | POA: Diagnosis not present

## 2019-01-29 DIAGNOSIS — I872 Venous insufficiency (chronic) (peripheral): Secondary | ICD-10-CM | POA: Diagnosis not present

## 2019-01-29 DIAGNOSIS — L97822 Non-pressure chronic ulcer of other part of left lower leg with fat layer exposed: Secondary | ICD-10-CM | POA: Diagnosis not present

## 2019-01-29 DIAGNOSIS — L97222 Non-pressure chronic ulcer of left calf with fat layer exposed: Secondary | ICD-10-CM | POA: Diagnosis not present

## 2019-01-29 DIAGNOSIS — L89322 Pressure ulcer of left buttock, stage 2: Secondary | ICD-10-CM | POA: Diagnosis not present

## 2019-01-29 DIAGNOSIS — G82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, complete: Secondary | ICD-10-CM | POA: Diagnosis not present

## 2019-01-29 DIAGNOSIS — I10 Essential (primary) hypertension: Secondary | ICD-10-CM | POA: Diagnosis not present

## 2019-01-29 DIAGNOSIS — L97522 Non-pressure chronic ulcer of other part of left foot with fat layer exposed: Secondary | ICD-10-CM | POA: Diagnosis not present

## 2019-01-29 DIAGNOSIS — L97511 Non-pressure chronic ulcer of other part of right 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: Secondary | ICD-10-CM | POA: Diagnosis not present

## 2019-01-29 DIAGNOSIS — G4730 Sleep apnea, unspecified: Secondary | ICD-10-CM | POA: Diagnosis not present

## 2019-02-05 DIAGNOSIS — L89322 Pressure ulcer of left buttock, stage 2: Secondary | ICD-10-CM | POA: Diagnosis not present

## 2019-02-05 DIAGNOSIS — L97522 Non-pressure chronic ulcer of other part of left foot with fat layer exposed: Secondary | ICD-10-CM | POA: Diagnosis not present

## 2019-02-05 DIAGNOSIS — I10 Essential (primary) hypertension: Secondary | ICD-10-CM | POA: Diagnosis not present

## 2019-02-05 DIAGNOSIS — G82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, complete: Secondary | ICD-10-CM | POA: Diagnosis not present

## 2019-02-05 DIAGNOSIS — L97511 Non-pressure chronic ulcer of other part of right 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: Secondary | ICD-10-CM | POA: Diagnosis not present

## 2019-02-05 DIAGNOSIS — L97822 Non-pressure chronic ulcer of other part of left lower leg with fat layer exposed: Secondary | ICD-10-CM | POA: Diagnosis not present

## 2019-02-05 DIAGNOSIS — G4730 Sleep apnea, unspecified: Secondary | ICD-10-CM | POA: Diagnosis not present

## 2019-02-06 DIAGNOSIS — L97509 Non-pressure chronic ulcer of other part of unspecified foot with unspecified severity: Secondary | ICD-10-CM | POA: Diagnosis not present

## 2019-02-12 DIAGNOSIS — G4730 Sleep apnea, unspecified: Secondary | ICD-10-CM | POA: Diagnosis not present

## 2019-02-12 DIAGNOSIS — L97822 Non-pressure chronic ulcer of other part of left lower leg with fat layer exposed: Secondary | ICD-10-CM | POA: Diagnosis not present

## 2019-02-12 DIAGNOSIS — I872 Venous insufficiency (chronic) (peripheral): Secondary | ICD-10-CM | POA: Diagnosis not present

## 2019-02-12 DIAGNOSIS — L97511 Non-pressure chronic ulcer of other part of right 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: Secondary | ICD-10-CM | POA: Diagnosis not present

## 2019-02-12 DIAGNOSIS — G82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, complete: Secondary | ICD-10-CM | POA: Diagnosis not present

## 2019-02-12 DIAGNOSIS — L97522 Non-pressure chronic ulcer of other part of left foot with fat layer exposed: Secondary | ICD-10-CM | POA: Diagnosis not present

## 2019-02-12 DIAGNOSIS — I10 Essential (primary) hypertension: Secondary | ICD-10-CM | POA: Diagnosis not present

## 2019-02-12 DIAGNOSIS — L89322 Pressure ulcer of left buttock, stage 2: Secondary | ICD-10-CM | POA: Diagnosis not present

## 2019-02-16 DIAGNOSIS — I10 Essential (primary) hypertension: Secondary | ICD-10-CM | POA: Diagnosis not present

## 2019-02-16 DIAGNOSIS — M62838 Other muscle spasm: Secondary | ICD-10-CM | POA: Diagnosis not present

## 2019-02-16 DIAGNOSIS — R635 Abnormal weight gain: Secondary | ICD-10-CM | POA: Diagnosis not present

## 2019-02-16 DIAGNOSIS — Z6829 Body mass index (BMI) 29.0-29.9, adult: Secondary | ICD-10-CM | POA: Diagnosis not present

## 2019-02-19 DIAGNOSIS — S31829A Unspecified open wound of left buttock, initial encounter: Secondary | ICD-10-CM | POA: Diagnosis not present

## 2019-02-19 DIAGNOSIS — I10 Essential (primary) hypertension: Secondary | ICD-10-CM | POA: Diagnosis not present

## 2019-02-19 DIAGNOSIS — L89322 Pressure ulcer of left buttock, stage 2: Secondary | ICD-10-CM | POA: Diagnosis not present

## 2019-02-19 DIAGNOSIS — L97522 Non-pressure chronic ulcer of other part of left foot with fat layer exposed: Secondary | ICD-10-CM | POA: Diagnosis not present

## 2019-02-19 DIAGNOSIS — G82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, complete: Secondary | ICD-10-CM | POA: Diagnosis not present

## 2019-02-19 DIAGNOSIS — L97822 Non-pressure chronic ulcer of other part of left lower leg with fat layer exposed: Secondary | ICD-10-CM | POA: Diagnosis not present

## 2019-02-19 DIAGNOSIS — L97922 Non-pressure chronic ulcer of unspecified part of left lower leg with fat layer exposed: Secondary | ICD-10-CM | POA: Diagnosis not present

## 2019-02-19 DIAGNOSIS — S91301A Unspecified open wound, right foot, initial encounter: Secondary | ICD-10-CM | POA: Diagnosis not present

## 2019-02-19 DIAGNOSIS — I872 Venous insufficiency (chronic) (peripheral): Secondary | ICD-10-CM | POA: Diagnosis not present

## 2019-02-19 DIAGNOSIS — S91302A Unspecified open wound, left foot, initial encounter: Secondary | ICD-10-CM | POA: Diagnosis not present

## 2019-02-19 DIAGNOSIS — G4730 Sleep apnea, unspecified: Secondary | ICD-10-CM | POA: Diagnosis not present

## 2019-02-19 DIAGNOSIS — L97222 Non-pressure chronic ulcer of left calf with fat layer exposed: Secondary | ICD-10-CM | POA: Diagnosis not present

## 2019-02-19 DIAGNOSIS — L97511 Non-pressure chronic ulcer of other part of right 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: Secondary | ICD-10-CM | POA: Diagnosis not present

## 2019-02-26 ENCOUNTER — Encounter (HOSPITAL_BASED_OUTPATIENT_CLINIC_OR_DEPARTMENT_OTHER): Payer: BLUE CROSS/BLUE SHIELD | Attending: Internal Medicine

## 2019-02-26 ENCOUNTER — Other Ambulatory Visit: Payer: Self-pay

## 2019-02-26 DIAGNOSIS — L89322 Pressure ulcer of left buttock, stage 2: Secondary | ICD-10-CM | POA: Insufficient documentation

## 2019-02-26 DIAGNOSIS — L97522 Non-pressure chronic ulcer of other part of left foot with fat layer exposed: Secondary | ICD-10-CM | POA: Insufficient documentation

## 2019-02-26 DIAGNOSIS — S91302A Unspecified open wound, left foot, initial encounter: Secondary | ICD-10-CM | POA: Diagnosis not present

## 2019-02-26 DIAGNOSIS — L97222 Non-pressure chronic ulcer of left calf with fat layer exposed: Secondary | ICD-10-CM | POA: Diagnosis not present

## 2019-02-26 DIAGNOSIS — I10 Essential (primary) hypertension: Secondary | ICD-10-CM | POA: Diagnosis not present

## 2019-02-26 DIAGNOSIS — G4730 Sleep apnea, unspecified: Secondary | ICD-10-CM | POA: Insufficient documentation

## 2019-02-26 DIAGNOSIS — L97512 Non-pressure chronic ulcer of other part of right foot with fat layer exposed: Secondary | ICD-10-CM | POA: Diagnosis not present

## 2019-02-26 DIAGNOSIS — G82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, complete: Secondary | ICD-10-CM | POA: Insufficient documentation

## 2019-02-26 DIAGNOSIS — I872 Venous insufficiency (chronic) (peripheral): Secondary | ICD-10-CM | POA: Diagnosis not present

## 2019-03-05 DIAGNOSIS — I10 Essential (primary) hypertension: Secondary | ICD-10-CM | POA: Diagnosis not present

## 2019-03-05 DIAGNOSIS — L97822 Non-pressure chronic ulcer of other part of left lower leg with fat layer exposed: Secondary | ICD-10-CM | POA: Diagnosis not present

## 2019-03-05 DIAGNOSIS — S91104A Unspecified open wound of right lesser toe(s) without damage to nail, initial encounter: Secondary | ICD-10-CM | POA: Diagnosis not present

## 2019-03-05 DIAGNOSIS — S91302A Unspecified open wound, left foot, initial encounter: Secondary | ICD-10-CM | POA: Diagnosis not present

## 2019-03-05 DIAGNOSIS — G82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, complete: Secondary | ICD-10-CM | POA: Diagnosis not present

## 2019-03-05 DIAGNOSIS — G4730 Sleep apnea, unspecified: Secondary | ICD-10-CM | POA: Diagnosis not present

## 2019-03-05 DIAGNOSIS — I872 Venous insufficiency (chronic) (peripheral): Secondary | ICD-10-CM | POA: Diagnosis not present

## 2019-03-05 DIAGNOSIS — L89322 Pressure ulcer of left buttock, stage 2: Secondary | ICD-10-CM | POA: Diagnosis not present

## 2019-03-05 DIAGNOSIS — L97512 Non-pressure chronic ulcer of other part of right foot with fat layer exposed: Secondary | ICD-10-CM | POA: Diagnosis not present

## 2019-03-05 DIAGNOSIS — L97222 Non-pressure chronic ulcer of left calf with fat layer exposed: Secondary | ICD-10-CM | POA: Diagnosis not present

## 2019-03-05 DIAGNOSIS — L97522 Non-pressure chronic ulcer of other part of left foot with fat layer exposed: Secondary | ICD-10-CM | POA: Diagnosis not present

## 2019-03-12 DIAGNOSIS — L97822 Non-pressure chronic ulcer of other part of left lower leg with fat layer exposed: Secondary | ICD-10-CM | POA: Diagnosis not present

## 2019-03-12 DIAGNOSIS — L97522 Non-pressure chronic ulcer of other part of left foot with fat layer exposed: Secondary | ICD-10-CM | POA: Diagnosis not present

## 2019-03-12 DIAGNOSIS — L97922 Non-pressure chronic ulcer of unspecified part of left lower leg with fat layer exposed: Secondary | ICD-10-CM | POA: Diagnosis not present

## 2019-03-12 DIAGNOSIS — L97222 Non-pressure chronic ulcer of left calf with fat layer exposed: Secondary | ICD-10-CM | POA: Diagnosis not present

## 2019-03-12 DIAGNOSIS — L89322 Pressure ulcer of left buttock, stage 2: Secondary | ICD-10-CM | POA: Diagnosis not present

## 2019-03-12 DIAGNOSIS — G82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, complete: Secondary | ICD-10-CM | POA: Diagnosis not present

## 2019-03-12 DIAGNOSIS — I10 Essential (primary) hypertension: Secondary | ICD-10-CM | POA: Diagnosis not present

## 2019-03-12 DIAGNOSIS — I872 Venous insufficiency (chronic) (peripheral): Secondary | ICD-10-CM | POA: Diagnosis not present

## 2019-03-12 DIAGNOSIS — G4730 Sleep apnea, unspecified: Secondary | ICD-10-CM | POA: Diagnosis not present

## 2019-03-12 DIAGNOSIS — L97512 Non-pressure chronic ulcer of other part of right foot with fat layer exposed: Secondary | ICD-10-CM | POA: Diagnosis not present

## 2019-03-12 DIAGNOSIS — S31829A Unspecified open wound of left buttock, initial encounter: Secondary | ICD-10-CM | POA: Diagnosis not present

## 2019-03-20 DIAGNOSIS — I10 Essential (primary) hypertension: Secondary | ICD-10-CM | POA: Diagnosis not present

## 2019-03-20 DIAGNOSIS — R635 Abnormal weight gain: Secondary | ICD-10-CM | POA: Diagnosis not present

## 2019-03-20 DIAGNOSIS — Z6829 Body mass index (BMI) 29.0-29.9, adult: Secondary | ICD-10-CM | POA: Diagnosis not present

## 2019-03-26 ENCOUNTER — Other Ambulatory Visit (HOSPITAL_COMMUNITY)
Admission: RE | Admit: 2019-03-26 | Discharge: 2019-03-26 | Disposition: A | Discharge status: Home / Self Care | Payer: BLUE CROSS/BLUE SHIELD | Source: Other Acute Inpatient Hospital | Attending: Internal Medicine | Admitting: Internal Medicine

## 2019-03-26 ENCOUNTER — Other Ambulatory Visit: Payer: Self-pay

## 2019-03-26 ENCOUNTER — Encounter (HOSPITAL_BASED_OUTPATIENT_CLINIC_OR_DEPARTMENT_OTHER): Payer: BC Managed Care – PPO | Attending: Internal Medicine

## 2019-03-26 DIAGNOSIS — I10 Essential (primary) hypertension: Secondary | ICD-10-CM | POA: Insufficient documentation

## 2019-03-26 DIAGNOSIS — L97512 Non-pressure chronic ulcer of other part of right foot with fat layer exposed: Secondary | ICD-10-CM | POA: Insufficient documentation

## 2019-03-26 DIAGNOSIS — B9620 Unspecified Escherichia coli [E. coli] as the cause of diseases classified elsewhere: Secondary | ICD-10-CM | POA: Diagnosis not present

## 2019-03-26 DIAGNOSIS — I872 Venous insufficiency (chronic) (peripheral): Secondary | ICD-10-CM | POA: Diagnosis not present

## 2019-03-26 DIAGNOSIS — G82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, complete: Secondary | ICD-10-CM | POA: Insufficient documentation

## 2019-03-26 DIAGNOSIS — G4730 Sleep apnea, unspecified: Secondary | ICD-10-CM | POA: Insufficient documentation

## 2019-03-26 DIAGNOSIS — L97511 Non-pressure chronic ulcer of other part of right 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: Secondary | ICD-10-CM | POA: Insufficient documentation

## 2019-03-26 DIAGNOSIS — B9689 Other specified bacterial agents as the cause of diseases classified elsewhere: Secondary | ICD-10-CM | POA: Diagnosis not present

## 2019-03-26 DIAGNOSIS — L97522 Non-pressure chronic ulcer of other part of left foot with fat layer exposed: Secondary | ICD-10-CM | POA: Insufficient documentation

## 2019-03-26 DIAGNOSIS — L89322 Pressure ulcer of left buttock, stage 2: Secondary | ICD-10-CM | POA: Insufficient documentation

## 2019-03-26 DIAGNOSIS — S91104A Unspecified open wound of right lesser toe(s) without damage to nail, initial encounter: Secondary | ICD-10-CM | POA: Diagnosis not present

## 2019-03-26 DIAGNOSIS — L97822 Non-pressure chronic ulcer of other part of left lower leg with fat layer exposed: Secondary | ICD-10-CM | POA: Diagnosis not present

## 2019-03-26 DIAGNOSIS — S91302A Unspecified open wound, left foot, initial encounter: Secondary | ICD-10-CM | POA: Diagnosis not present

## 2019-03-29 LAB — AEROBIC CULTURE W GRAM STAIN (SUPERFICIAL SPECIMEN)

## 2019-03-29 LAB — AEROBIC CULTURE? (SUPERFICIAL SPECIMEN)

## 2019-04-02 ENCOUNTER — Other Ambulatory Visit (HOSPITAL_COMMUNITY): Payer: Self-pay | Admitting: Internal Medicine

## 2019-04-02 ENCOUNTER — Ambulatory Visit (HOSPITAL_COMMUNITY)
Admission: RE | Admit: 2019-04-02 | Discharge: 2019-04-02 | Disposition: A | Discharge status: Home / Self Care | Payer: BLUE CROSS/BLUE SHIELD | Source: Ambulatory Visit | Attending: Internal Medicine | Admitting: Internal Medicine

## 2019-04-02 ENCOUNTER — Other Ambulatory Visit: Payer: Self-pay

## 2019-04-02 DIAGNOSIS — L97511 Non-pressure chronic ulcer of other part of right 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: Secondary | ICD-10-CM | POA: Diagnosis not present

## 2019-04-02 DIAGNOSIS — I872 Venous insufficiency (chronic) (peripheral): Secondary | ICD-10-CM | POA: Diagnosis not present

## 2019-04-02 DIAGNOSIS — L97512 Non-pressure chronic ulcer of other part of right foot with fat layer exposed: Secondary | ICD-10-CM | POA: Diagnosis not present

## 2019-04-02 DIAGNOSIS — L97521 Non-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: Secondary | ICD-10-CM | POA: Diagnosis not present

## 2019-04-02 DIAGNOSIS — L97222 Non-pressure chronic ulcer of left calf with fat layer exposed: Secondary | ICD-10-CM | POA: Diagnosis not present

## 2019-04-02 DIAGNOSIS — L97522 Non-pressure chronic ulcer of other part of left foot with fat layer exposed: Secondary | ICD-10-CM | POA: Diagnosis not present

## 2019-04-02 DIAGNOSIS — B9689 Other specified bacterial agents as the cause of diseases classified elsewhere: Secondary | ICD-10-CM | POA: Diagnosis not present

## 2019-04-02 DIAGNOSIS — L03115 Cellulitis of right lower limb: Secondary | ICD-10-CM | POA: Diagnosis not present

## 2019-04-02 DIAGNOSIS — B9629 Other Escherichia coli [E. coli] as the cause of diseases classified elsewhere: Secondary | ICD-10-CM | POA: Diagnosis not present

## 2019-04-02 DIAGNOSIS — L97822 Non-pressure chronic ulcer of other part of left lower leg with fat layer exposed: Secondary | ICD-10-CM | POA: Diagnosis not present

## 2019-04-02 DIAGNOSIS — M7989 Other specified soft tissue disorders: Secondary | ICD-10-CM | POA: Diagnosis not present

## 2019-04-02 DIAGNOSIS — L97922 Non-pressure chronic ulcer of unspecified part of left lower leg with fat layer exposed: Secondary | ICD-10-CM | POA: Diagnosis not present

## 2019-04-02 DIAGNOSIS — B9620 Unspecified Escherichia coli [E. coli] as the cause of diseases classified elsewhere: Secondary | ICD-10-CM | POA: Diagnosis not present

## 2019-04-02 DIAGNOSIS — G82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, complete: Secondary | ICD-10-CM | POA: Diagnosis not present

## 2019-04-02 DIAGNOSIS — G4730 Sleep apnea, unspecified: Secondary | ICD-10-CM | POA: Diagnosis not present

## 2019-04-02 DIAGNOSIS — L89322 Pressure ulcer of left buttock, stage 2: Secondary | ICD-10-CM | POA: Diagnosis not present

## 2019-04-02 DIAGNOSIS — I10 Essential (primary) hypertension: Secondary | ICD-10-CM | POA: Diagnosis not present

## 2019-04-09 ENCOUNTER — Ambulatory Visit: Payer: BLUE CROSS/BLUE SHIELD | Admitting: Psychiatry

## 2019-04-09 DIAGNOSIS — L97522 Non-pressure chronic ulcer of other part of left foot with fat layer exposed: Secondary | ICD-10-CM | POA: Diagnosis not present

## 2019-04-09 DIAGNOSIS — B9620 Unspecified Escherichia coli [E. coli] as the cause of diseases classified elsewhere: Secondary | ICD-10-CM | POA: Diagnosis not present

## 2019-04-09 DIAGNOSIS — L97822 Non-pressure chronic ulcer of other part of left lower leg with fat layer exposed: Secondary | ICD-10-CM | POA: Diagnosis not present

## 2019-04-09 DIAGNOSIS — B9689 Other specified bacterial agents as the cause of diseases classified elsewhere: Secondary | ICD-10-CM | POA: Diagnosis not present

## 2019-04-09 DIAGNOSIS — I10 Essential (primary) hypertension: Secondary | ICD-10-CM | POA: Diagnosis not present

## 2019-04-09 DIAGNOSIS — L89322 Pressure ulcer of left buttock, stage 2: Secondary | ICD-10-CM | POA: Diagnosis not present

## 2019-04-09 DIAGNOSIS — G82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, complete: Secondary | ICD-10-CM | POA: Diagnosis not present

## 2019-04-09 DIAGNOSIS — L97512 Non-pressure chronic ulcer of other part of right foot with fat layer exposed: Secondary | ICD-10-CM | POA: Diagnosis not present

## 2019-04-09 DIAGNOSIS — G4730 Sleep apnea, unspecified: Secondary | ICD-10-CM | POA: Diagnosis not present

## 2019-04-17 DIAGNOSIS — L97512 Non-pressure chronic ulcer of other part of right foot with fat layer exposed: Secondary | ICD-10-CM | POA: Diagnosis not present

## 2019-04-17 DIAGNOSIS — L97522 Non-pressure chronic ulcer of other part of left foot with fat layer exposed: Secondary | ICD-10-CM | POA: Diagnosis not present

## 2019-04-17 DIAGNOSIS — G82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, complete: Secondary | ICD-10-CM | POA: Diagnosis not present

## 2019-04-17 DIAGNOSIS — L97822 Non-pressure chronic ulcer of other part of left lower leg with fat layer exposed: Secondary | ICD-10-CM | POA: Diagnosis not present

## 2019-04-17 DIAGNOSIS — I10 Essential (primary) hypertension: Secondary | ICD-10-CM | POA: Diagnosis not present

## 2019-04-17 DIAGNOSIS — I872 Venous insufficiency (chronic) (peripheral): Secondary | ICD-10-CM | POA: Diagnosis not present

## 2019-04-17 DIAGNOSIS — B9689 Other specified bacterial agents as the cause of diseases classified elsewhere: Secondary | ICD-10-CM | POA: Diagnosis not present

## 2019-04-17 DIAGNOSIS — L97222 Non-pressure chronic ulcer of left calf with fat layer exposed: Secondary | ICD-10-CM | POA: Diagnosis not present

## 2019-04-17 DIAGNOSIS — B9620 Unspecified Escherichia coli [E. coli] as the cause of diseases classified elsewhere: Secondary | ICD-10-CM | POA: Diagnosis not present

## 2019-04-17 DIAGNOSIS — G4730 Sleep apnea, unspecified: Secondary | ICD-10-CM | POA: Diagnosis not present

## 2019-04-17 DIAGNOSIS — L89322 Pressure ulcer of left buttock, stage 2: Secondary | ICD-10-CM | POA: Diagnosis not present

## 2019-04-19 DIAGNOSIS — R635 Abnormal weight gain: Secondary | ICD-10-CM | POA: Diagnosis not present

## 2019-04-19 DIAGNOSIS — I10 Essential (primary) hypertension: Secondary | ICD-10-CM | POA: Diagnosis not present

## 2019-04-19 DIAGNOSIS — Z6829 Body mass index (BMI) 29.0-29.9, adult: Secondary | ICD-10-CM | POA: Diagnosis not present

## 2019-04-23 ENCOUNTER — Encounter (HOSPITAL_BASED_OUTPATIENT_CLINIC_OR_DEPARTMENT_OTHER): Payer: BC Managed Care – PPO | Attending: Internal Medicine

## 2019-04-23 ENCOUNTER — Other Ambulatory Visit: Payer: Self-pay

## 2019-04-23 DIAGNOSIS — G82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, complete: Secondary | ICD-10-CM | POA: Insufficient documentation

## 2019-04-23 DIAGNOSIS — L98412 Non-pressure chronic ulcer of buttock with fat layer exposed: Secondary | ICD-10-CM | POA: Diagnosis not present

## 2019-04-23 DIAGNOSIS — I10 Essential (primary) hypertension: Secondary | ICD-10-CM | POA: Insufficient documentation

## 2019-04-23 DIAGNOSIS — I872 Venous insufficiency (chronic) (peripheral): Secondary | ICD-10-CM | POA: Diagnosis not present

## 2019-04-23 DIAGNOSIS — G4730 Sleep apnea, unspecified: Secondary | ICD-10-CM | POA: Diagnosis not present

## 2019-04-23 DIAGNOSIS — L97512 Non-pressure chronic ulcer of other part of right foot with fat layer exposed: Secondary | ICD-10-CM | POA: Diagnosis not present

## 2019-04-23 DIAGNOSIS — L97822 Non-pressure chronic ulcer of other part of left lower leg with fat layer exposed: Secondary | ICD-10-CM | POA: Diagnosis not present

## 2019-04-23 DIAGNOSIS — L97522 Non-pressure chronic ulcer of other part of left foot with fat layer exposed: Secondary | ICD-10-CM | POA: Diagnosis not present

## 2019-04-23 DIAGNOSIS — S91301A Unspecified open wound, right foot, initial encounter: Secondary | ICD-10-CM | POA: Diagnosis not present

## 2019-04-23 DIAGNOSIS — S91302A Unspecified open wound, left foot, initial encounter: Secondary | ICD-10-CM | POA: Diagnosis not present

## 2019-04-25 ENCOUNTER — Ambulatory Visit: Payer: BC Managed Care – PPO | Admitting: Psychiatry

## 2019-04-25 ENCOUNTER — Encounter: Payer: Self-pay | Admitting: Psychiatry

## 2019-04-25 ENCOUNTER — Other Ambulatory Visit: Payer: Self-pay

## 2019-04-25 DIAGNOSIS — N319 Neuromuscular dysfunction of bladder, unspecified: Secondary | ICD-10-CM | POA: Diagnosis not present

## 2019-04-25 DIAGNOSIS — Z9989 Dependence on other enabling machines and devices: Secondary | ICD-10-CM

## 2019-04-25 DIAGNOSIS — F341 Dysthymic disorder: Secondary | ICD-10-CM | POA: Diagnosis not present

## 2019-04-25 DIAGNOSIS — G4733 Obstructive sleep apnea (adult) (pediatric): Secondary | ICD-10-CM | POA: Diagnosis not present

## 2019-04-25 DIAGNOSIS — S24103D Unspecified injury at T7-T10 level of thoracic spinal cord, subsequent encounter: Secondary | ICD-10-CM

## 2019-04-25 MED ORDER — SERTRALINE HCL 100 MG PO TABS: 100.0000 mg | ORAL_TABLET | Freq: Every day | ORAL | 5 refills | Status: DC
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
## 2019-04-26 DIAGNOSIS — R300 Dysuria: Secondary | ICD-10-CM | POA: Diagnosis not present

## 2019-04-26 DIAGNOSIS — Z6829 Body mass index (BMI) 29.0-29.9, adult: Secondary | ICD-10-CM | POA: Diagnosis not present

## 2019-04-30 DIAGNOSIS — S91301A Unspecified open wound, right foot, initial encounter: Secondary | ICD-10-CM | POA: Diagnosis not present

## 2019-04-30 DIAGNOSIS — G82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, complete: Secondary | ICD-10-CM | POA: Diagnosis not present

## 2019-04-30 DIAGNOSIS — I10 Essential (primary) hypertension: Secondary | ICD-10-CM | POA: Diagnosis not present

## 2019-04-30 DIAGNOSIS — G4730 Sleep apnea, unspecified: Secondary | ICD-10-CM | POA: Diagnosis not present

## 2019-04-30 DIAGNOSIS — L89322 Pressure ulcer of left buttock, stage 2: Secondary | ICD-10-CM | POA: Diagnosis not present

## 2019-04-30 DIAGNOSIS — L97822 Non-pressure chronic ulcer of other part of left lower leg with fat layer exposed: Secondary | ICD-10-CM | POA: Diagnosis not present

## 2019-04-30 DIAGNOSIS — I872 Venous insufficiency (chronic) (peripheral): Secondary | ICD-10-CM | POA: Diagnosis not present

## 2019-04-30 DIAGNOSIS — L97522 Non-pressure chronic ulcer of other part of left foot with fat layer exposed: Secondary | ICD-10-CM | POA: Diagnosis not present

## 2019-04-30 DIAGNOSIS — L97512 Non-pressure chronic ulcer of other part of right foot with fat layer exposed: Secondary | ICD-10-CM | POA: Diagnosis not present

## 2019-04-30 DIAGNOSIS — L98412 Non-pressure chronic ulcer of buttock with fat layer exposed: Secondary | ICD-10-CM | POA: Diagnosis not present

## 2019-05-07 DIAGNOSIS — L97512 Non-pressure chronic ulcer of other part of right foot with fat layer exposed: Secondary | ICD-10-CM | POA: Diagnosis not present

## 2019-05-07 DIAGNOSIS — L97222 Non-pressure chronic ulcer of left calf with fat layer exposed: Secondary | ICD-10-CM | POA: Diagnosis not present

## 2019-05-07 DIAGNOSIS — G82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, complete: Secondary | ICD-10-CM | POA: Diagnosis not present

## 2019-05-07 DIAGNOSIS — G4730 Sleep apnea, unspecified: Secondary | ICD-10-CM | POA: Diagnosis not present

## 2019-05-07 DIAGNOSIS — I10 Essential (primary) hypertension: Secondary | ICD-10-CM | POA: Diagnosis not present

## 2019-05-07 DIAGNOSIS — L97822 Non-pressure chronic ulcer of other part of left lower leg with fat layer exposed: Secondary | ICD-10-CM | POA: Diagnosis not present

## 2019-05-07 DIAGNOSIS — L97922 Non-pressure chronic ulcer of unspecified part of left lower leg with fat layer exposed: Secondary | ICD-10-CM | POA: Diagnosis not present

## 2019-05-07 DIAGNOSIS — L97522 Non-pressure chronic ulcer of other part of left foot with fat layer exposed: Secondary | ICD-10-CM | POA: Diagnosis not present

## 2019-05-07 DIAGNOSIS — I872 Venous insufficiency (chronic) (peripheral): Secondary | ICD-10-CM | POA: Diagnosis not present

## 2019-05-07 DIAGNOSIS — L98412 Non-pressure chronic ulcer of buttock with fat layer exposed: Secondary | ICD-10-CM | POA: Diagnosis not present

## 2019-05-14 DIAGNOSIS — L03115 Cellulitis of right lower limb: Secondary | ICD-10-CM | POA: Diagnosis not present

## 2019-05-14 DIAGNOSIS — L97512 Non-pressure chronic ulcer of other part of right foot with fat layer exposed: Secondary | ICD-10-CM | POA: Diagnosis not present

## 2019-05-14 DIAGNOSIS — L97822 Non-pressure chronic ulcer of other part of left lower leg with fat layer exposed: Secondary | ICD-10-CM | POA: Diagnosis not present

## 2019-05-14 DIAGNOSIS — L97522 Non-pressure chronic ulcer of other part of left foot with fat layer exposed: Secondary | ICD-10-CM | POA: Diagnosis not present

## 2019-05-14 DIAGNOSIS — G82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, complete: Secondary | ICD-10-CM | POA: Diagnosis not present

## 2019-05-14 DIAGNOSIS — G4730 Sleep apnea, unspecified: Secondary | ICD-10-CM | POA: Diagnosis not present

## 2019-05-14 DIAGNOSIS — I10 Essential (primary) hypertension: Secondary | ICD-10-CM | POA: Diagnosis not present

## 2019-05-14 DIAGNOSIS — L98412 Non-pressure chronic ulcer of buttock with fat layer exposed: Secondary | ICD-10-CM | POA: Diagnosis not present

## 2019-05-14 DIAGNOSIS — L97222 Non-pressure chronic ulcer of left calf with fat layer exposed: Secondary | ICD-10-CM | POA: Diagnosis not present

## 2019-05-15 DIAGNOSIS — L97909 Non-pressure chronic ulcer of unspecified part of unspecified lower leg with unspecified severity: Secondary | ICD-10-CM | POA: Diagnosis not present

## 2019-05-15 DIAGNOSIS — I890 Lymphedema, not elsewhere classified: Secondary | ICD-10-CM | POA: Diagnosis not present

## 2019-05-15 DIAGNOSIS — G8220 Paraplegia, unspecified: Secondary | ICD-10-CM | POA: Diagnosis not present

## 2019-05-15 DIAGNOSIS — L97509 Non-pressure chronic ulcer of other part of unspecified foot with unspecified severity: Secondary | ICD-10-CM | POA: Diagnosis not present

## 2019-05-21 ENCOUNTER — Other Ambulatory Visit (HOSPITAL_COMMUNITY): Payer: Self-pay | Admitting: Internal Medicine

## 2019-05-21 ENCOUNTER — Other Ambulatory Visit: Payer: Self-pay

## 2019-05-21 ENCOUNTER — Ambulatory Visit (HOSPITAL_COMMUNITY)
Admission: RE | Admit: 2019-05-21 | Discharge: 2019-05-21 | Disposition: A | Discharge status: Home / Self Care | Payer: BC Managed Care – PPO | Source: Ambulatory Visit | Attending: Internal Medicine | Admitting: Internal Medicine

## 2019-05-21 DIAGNOSIS — M25532 Pain in left wrist: Secondary | ICD-10-CM | POA: Insufficient documentation

## 2019-05-21 DIAGNOSIS — L97522 Non-pressure chronic ulcer of other part of left foot with fat layer exposed: Secondary | ICD-10-CM | POA: Diagnosis not present

## 2019-05-21 DIAGNOSIS — L97822 Non-pressure chronic ulcer of other part of left lower leg with fat layer exposed: Secondary | ICD-10-CM | POA: Diagnosis not present

## 2019-05-21 DIAGNOSIS — L97922 Non-pressure chronic ulcer of unspecified part of left lower leg with fat layer exposed: Secondary | ICD-10-CM | POA: Diagnosis not present

## 2019-05-21 DIAGNOSIS — L97512 Non-pressure chronic ulcer of other part of right foot with fat layer exposed: Secondary | ICD-10-CM | POA: Diagnosis not present

## 2019-05-21 DIAGNOSIS — G4730 Sleep apnea, unspecified: Secondary | ICD-10-CM | POA: Diagnosis not present

## 2019-05-21 DIAGNOSIS — M25432 Effusion, left wrist: Secondary | ICD-10-CM | POA: Diagnosis not present

## 2019-05-21 DIAGNOSIS — L98412 Non-pressure chronic ulcer of buttock with fat layer exposed: Secondary | ICD-10-CM | POA: Diagnosis not present

## 2019-05-21 DIAGNOSIS — G82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, complete: Secondary | ICD-10-CM

## 2019-05-21 DIAGNOSIS — I872 Venous insufficiency (chronic) (peripheral): Secondary | ICD-10-CM | POA: Diagnosis not present

## 2019-05-21 DIAGNOSIS — I10 Essential (primary) hypertension: Secondary | ICD-10-CM | POA: Diagnosis not present

## 2019-05-28 ENCOUNTER — Encounter (HOSPITAL_BASED_OUTPATIENT_CLINIC_OR_DEPARTMENT_OTHER): Payer: BC Managed Care – PPO | Attending: Internal Medicine

## 2019-05-28 DIAGNOSIS — L89322 Pressure ulcer of left buttock, stage 2: Secondary | ICD-10-CM | POA: Diagnosis not present

## 2019-05-28 DIAGNOSIS — G4730 Sleep apnea, unspecified: Secondary | ICD-10-CM | POA: Insufficient documentation

## 2019-05-28 DIAGNOSIS — I10 Essential (primary) hypertension: Secondary | ICD-10-CM | POA: Insufficient documentation

## 2019-05-28 DIAGNOSIS — L97522 Non-pressure chronic ulcer of other part of left foot with fat layer exposed: Secondary | ICD-10-CM | POA: Insufficient documentation

## 2019-05-28 DIAGNOSIS — L97512 Non-pressure chronic ulcer of other part of right foot with fat layer exposed: Secondary | ICD-10-CM | POA: Insufficient documentation

## 2019-05-28 DIAGNOSIS — G82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, complete: Secondary | ICD-10-CM | POA: Insufficient documentation

## 2019-05-28 DIAGNOSIS — L97822 Non-pressure chronic ulcer of other part of left lower leg with fat layer exposed: Secondary | ICD-10-CM | POA: Diagnosis not present

## 2019-05-30 DIAGNOSIS — I10 Essential (primary) hypertension: Secondary | ICD-10-CM | POA: Diagnosis not present

## 2019-05-30 DIAGNOSIS — M62838 Other muscle spasm: Secondary | ICD-10-CM | POA: Diagnosis not present

## 2019-05-30 DIAGNOSIS — R635 Abnormal weight gain: Secondary | ICD-10-CM | POA: Diagnosis not present

## 2019-05-30 DIAGNOSIS — R350 Frequency of micturition: Secondary | ICD-10-CM | POA: Diagnosis not present

## 2019-05-31 DIAGNOSIS — M25532 Pain in left wrist: Secondary | ICD-10-CM | POA: Diagnosis not present

## 2019-06-02 DIAGNOSIS — M25532 Pain in left wrist: Secondary | ICD-10-CM | POA: Diagnosis not present

## 2019-06-02 DIAGNOSIS — M65842 Other synovitis and tenosynovitis, left hand: Secondary | ICD-10-CM | POA: Diagnosis not present

## 2019-06-04 DIAGNOSIS — L97512 Non-pressure chronic ulcer of other part of right foot with fat layer exposed: Secondary | ICD-10-CM | POA: Diagnosis not present

## 2019-06-04 DIAGNOSIS — G4730 Sleep apnea, unspecified: Secondary | ICD-10-CM | POA: Diagnosis not present

## 2019-06-04 DIAGNOSIS — L97822 Non-pressure chronic ulcer of other part of left lower leg with fat layer exposed: Secondary | ICD-10-CM | POA: Diagnosis not present

## 2019-06-04 DIAGNOSIS — L89322 Pressure ulcer of left buttock, stage 2: Secondary | ICD-10-CM | POA: Diagnosis not present

## 2019-06-04 DIAGNOSIS — L97222 Non-pressure chronic ulcer of left calf with fat layer exposed: Secondary | ICD-10-CM | POA: Diagnosis not present

## 2019-06-04 DIAGNOSIS — G82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, complete: Secondary | ICD-10-CM | POA: Diagnosis not present

## 2019-06-04 DIAGNOSIS — M359 Systemic involvement of connective tissue, unspecified: Secondary | ICD-10-CM | POA: Diagnosis not present

## 2019-06-04 DIAGNOSIS — I10 Essential (primary) hypertension: Secondary | ICD-10-CM | POA: Diagnosis not present

## 2019-06-04 DIAGNOSIS — L97522 Non-pressure chronic ulcer of other part of left foot with fat layer exposed: Secondary | ICD-10-CM | POA: Diagnosis not present

## 2019-06-11 DIAGNOSIS — G82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, complete: Secondary | ICD-10-CM | POA: Diagnosis not present

## 2019-06-11 DIAGNOSIS — L89322 Pressure ulcer of left buttock, stage 2: Secondary | ICD-10-CM | POA: Diagnosis not present

## 2019-06-11 DIAGNOSIS — L97822 Non-pressure chronic ulcer of other part of left lower leg with fat layer exposed: Secondary | ICD-10-CM | POA: Diagnosis not present

## 2019-06-11 DIAGNOSIS — L97512 Non-pressure chronic ulcer of other part of right foot with fat layer exposed: Secondary | ICD-10-CM | POA: Diagnosis not present

## 2019-06-11 DIAGNOSIS — L97522 Non-pressure chronic ulcer of other part of left foot with fat layer exposed: Secondary | ICD-10-CM | POA: Diagnosis not present

## 2019-06-11 DIAGNOSIS — G4730 Sleep apnea, unspecified: Secondary | ICD-10-CM | POA: Diagnosis not present

## 2019-06-11 DIAGNOSIS — I10 Essential (primary) hypertension: Secondary | ICD-10-CM | POA: Diagnosis not present

## 2019-06-14 DIAGNOSIS — M25532 Pain in left wrist: Secondary | ICD-10-CM | POA: Diagnosis not present

## 2019-06-18 DIAGNOSIS — L97512 Non-pressure chronic ulcer of other part of right foot with fat layer exposed: Secondary | ICD-10-CM | POA: Diagnosis not present

## 2019-06-18 DIAGNOSIS — L89322 Pressure ulcer of left buttock, stage 2: Secondary | ICD-10-CM | POA: Diagnosis not present

## 2019-06-18 DIAGNOSIS — L97522 Non-pressure chronic ulcer of other part of left foot with fat layer exposed: Secondary | ICD-10-CM | POA: Diagnosis not present

## 2019-06-18 DIAGNOSIS — G4730 Sleep apnea, unspecified: Secondary | ICD-10-CM | POA: Diagnosis not present

## 2019-06-18 DIAGNOSIS — G82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, complete: Secondary | ICD-10-CM | POA: Diagnosis not present

## 2019-06-18 DIAGNOSIS — I10 Essential (primary) hypertension: Secondary | ICD-10-CM | POA: Diagnosis not present

## 2019-06-18 DIAGNOSIS — L97822 Non-pressure chronic ulcer of other part of left lower leg with fat layer exposed: Secondary | ICD-10-CM | POA: Diagnosis not present

## 2019-06-25 ENCOUNTER — Encounter (HOSPITAL_BASED_OUTPATIENT_CLINIC_OR_DEPARTMENT_OTHER): Payer: BC Managed Care – PPO | Attending: Internal Medicine

## 2019-06-25 ENCOUNTER — Other Ambulatory Visit: Payer: Self-pay

## 2019-06-25 DIAGNOSIS — G82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, complete: Secondary | ICD-10-CM | POA: Diagnosis not present

## 2019-06-25 DIAGNOSIS — L97522 Non-pressure chronic ulcer of other part of left foot with fat layer exposed: Secondary | ICD-10-CM | POA: Diagnosis not present

## 2019-06-25 DIAGNOSIS — L97922 Non-pressure chronic ulcer of unspecified part of left lower leg with fat layer exposed: Secondary | ICD-10-CM | POA: Diagnosis not present

## 2019-06-25 DIAGNOSIS — L89322 Pressure ulcer of left buttock, stage 2: Secondary | ICD-10-CM | POA: Diagnosis not present

## 2019-06-25 DIAGNOSIS — L97512 Non-pressure chronic ulcer of other part of right foot with fat layer exposed: Secondary | ICD-10-CM | POA: Diagnosis not present

## 2019-06-29 DIAGNOSIS — I10 Essential (primary) hypertension: Secondary | ICD-10-CM | POA: Diagnosis not present

## 2019-06-29 DIAGNOSIS — R635 Abnormal weight gain: Secondary | ICD-10-CM | POA: Diagnosis not present

## 2019-06-29 DIAGNOSIS — Z6829 Body mass index (BMI) 29.0-29.9, adult: Secondary | ICD-10-CM | POA: Diagnosis not present

## 2019-07-02 DIAGNOSIS — L97512 Non-pressure chronic ulcer of other part of right foot with fat layer exposed: Secondary | ICD-10-CM | POA: Diagnosis not present

## 2019-07-02 DIAGNOSIS — L97522 Non-pressure chronic ulcer of other part of left foot with fat layer exposed: Secondary | ICD-10-CM | POA: Diagnosis not present

## 2019-07-02 DIAGNOSIS — G82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, complete: Secondary | ICD-10-CM | POA: Diagnosis not present

## 2019-07-02 DIAGNOSIS — L89322 Pressure ulcer of left buttock, stage 2: Secondary | ICD-10-CM | POA: Diagnosis not present

## 2019-07-09 DIAGNOSIS — L97512 Non-pressure chronic ulcer of other part of right foot with fat layer exposed: Secondary | ICD-10-CM | POA: Diagnosis not present

## 2019-07-09 DIAGNOSIS — L89322 Pressure ulcer of left buttock, stage 2: Secondary | ICD-10-CM | POA: Diagnosis not present

## 2019-07-09 DIAGNOSIS — L97522 Non-pressure chronic ulcer of other part of left foot with fat layer exposed: Secondary | ICD-10-CM | POA: Diagnosis not present

## 2019-07-09 DIAGNOSIS — G82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, complete: Secondary | ICD-10-CM | POA: Diagnosis not present

## 2019-07-09 DIAGNOSIS — L89329 Pressure ulcer of left buttock, unspecified stage: Secondary | ICD-10-CM | POA: Diagnosis not present

## 2019-07-16 DIAGNOSIS — L89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, stage 3: Secondary | ICD-10-CM | POA: Diagnosis not present

## 2019-07-16 DIAGNOSIS — L97512 Non-pressure chronic ulcer of other part of right foot with fat layer exposed: Secondary | ICD-10-CM | POA: Diagnosis not present

## 2019-07-16 DIAGNOSIS — L89322 Pressure ulcer of left buttock, stage 2: Secondary | ICD-10-CM | POA: Diagnosis not present

## 2019-07-16 DIAGNOSIS — L97522 Non-pressure chronic ulcer of other part of left foot with fat layer exposed: Secondary | ICD-10-CM | POA: Diagnosis not present

## 2019-07-16 DIAGNOSIS — G82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, complete: Secondary | ICD-10-CM | POA: Diagnosis not present

## 2019-07-16 DIAGNOSIS — L97922 Non-pressure chronic ulcer of unspecified part of left lower leg with fat layer exposed: Secondary | ICD-10-CM | POA: Diagnosis not present

## 2019-07-23 DIAGNOSIS — L89322 Pressure ulcer of left buttock, stage 2: Secondary | ICD-10-CM | POA: Diagnosis not present

## 2019-07-23 DIAGNOSIS — L97522 Non-pressure chronic ulcer of other part of left foot with fat layer exposed: Secondary | ICD-10-CM | POA: Diagnosis not present

## 2019-07-23 DIAGNOSIS — G82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, complete: Secondary | ICD-10-CM | POA: Diagnosis not present

## 2019-07-23 DIAGNOSIS — L97512 Non-pressure chronic ulcer of other part of right foot with fat layer exposed: Secondary | ICD-10-CM | POA: Diagnosis not present

## 2019-07-26 DIAGNOSIS — R635 Abnormal weight gain: Secondary | ICD-10-CM | POA: Diagnosis not present

## 2019-07-26 DIAGNOSIS — I10 Essential (primary) hypertension: Secondary | ICD-10-CM | POA: Diagnosis not present

## 2019-07-26 DIAGNOSIS — Z6829 Body mass index (BMI) 29.0-29.9, adult: Secondary | ICD-10-CM | POA: Diagnosis not present

## 2019-07-31 ENCOUNTER — Other Ambulatory Visit: Payer: Self-pay

## 2019-07-31 ENCOUNTER — Encounter (HOSPITAL_BASED_OUTPATIENT_CLINIC_OR_DEPARTMENT_OTHER): Payer: BC Managed Care – PPO | Attending: Internal Medicine

## 2019-07-31 DIAGNOSIS — L97522 Non-pressure chronic ulcer of other part of left foot with fat layer exposed: Secondary | ICD-10-CM | POA: Insufficient documentation

## 2019-07-31 DIAGNOSIS — G4730 Sleep apnea, unspecified: Secondary | ICD-10-CM | POA: Diagnosis not present

## 2019-07-31 DIAGNOSIS — L89302 Pressure ulcer of unspecified buttock, stage 2: Secondary | ICD-10-CM | POA: Diagnosis not present

## 2019-07-31 DIAGNOSIS — L97512 Non-pressure chronic ulcer of other part of right foot with fat layer exposed: Secondary | ICD-10-CM | POA: Diagnosis not present

## 2019-07-31 DIAGNOSIS — G82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, complete: Secondary | ICD-10-CM | POA: Diagnosis not present

## 2019-07-31 DIAGNOSIS — I10 Essential (primary) hypertension: Secondary | ICD-10-CM | POA: Insufficient documentation

## 2019-08-06 DIAGNOSIS — L89329 Pressure ulcer of left buttock, unspecified stage: Secondary | ICD-10-CM | POA: Diagnosis not present

## 2019-08-06 DIAGNOSIS — G4730 Sleep apnea, unspecified: Secondary | ICD-10-CM | POA: Diagnosis not present

## 2019-08-06 DIAGNOSIS — L89302 Pressure ulcer of unspecified buttock, stage 2: Secondary | ICD-10-CM | POA: Diagnosis not present

## 2019-08-06 DIAGNOSIS — G82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, complete: Secondary | ICD-10-CM | POA: Diagnosis not present

## 2019-08-06 DIAGNOSIS — L97522 Non-pressure chronic ulcer of other part of left foot with fat layer exposed: Secondary | ICD-10-CM | POA: Diagnosis not present

## 2019-08-06 DIAGNOSIS — I10 Essential (primary) hypertension: Secondary | ICD-10-CM | POA: Diagnosis not present

## 2019-08-06 DIAGNOSIS — L97512 Non-pressure chronic ulcer of other part of right foot with fat layer exposed: Secondary | ICD-10-CM | POA: Diagnosis not present

## 2019-08-13 DIAGNOSIS — L97522 Non-pressure chronic ulcer of other part of left foot with fat layer exposed: Secondary | ICD-10-CM | POA: Diagnosis not present

## 2019-08-13 DIAGNOSIS — G4730 Sleep apnea, unspecified: Secondary | ICD-10-CM | POA: Diagnosis not present

## 2019-08-13 DIAGNOSIS — S91302A Unspecified open wound, left foot, initial encounter: Secondary | ICD-10-CM | POA: Diagnosis not present

## 2019-08-13 DIAGNOSIS — I10 Essential (primary) hypertension: Secondary | ICD-10-CM | POA: Diagnosis not present

## 2019-08-13 DIAGNOSIS — G82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, complete: Secondary | ICD-10-CM | POA: Diagnosis not present

## 2019-08-13 DIAGNOSIS — L89302 Pressure ulcer of unspecified buttock, stage 2: Secondary | ICD-10-CM | POA: Diagnosis not present

## 2019-08-13 DIAGNOSIS — L97512 Non-pressure chronic ulcer of other part of right foot with fat layer exposed: Secondary | ICD-10-CM | POA: Diagnosis not present

## 2019-08-20 DIAGNOSIS — L89302 Pressure ulcer of unspecified buttock, stage 2: Secondary | ICD-10-CM | POA: Diagnosis not present

## 2019-08-20 DIAGNOSIS — L97512 Non-pressure chronic ulcer of other part of right foot with fat layer exposed: Secondary | ICD-10-CM | POA: Diagnosis not present

## 2019-08-20 DIAGNOSIS — L97522 Non-pressure chronic ulcer of other part of left foot with fat layer exposed: Secondary | ICD-10-CM | POA: Diagnosis not present

## 2019-08-20 DIAGNOSIS — G4730 Sleep apnea, unspecified: Secondary | ICD-10-CM | POA: Diagnosis not present

## 2019-08-20 DIAGNOSIS — G82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, complete: Secondary | ICD-10-CM | POA: Diagnosis not present

## 2019-08-20 DIAGNOSIS — I10 Essential (primary) hypertension: Secondary | ICD-10-CM | POA: Diagnosis not present

## 2019-08-20 DIAGNOSIS — S91302A Unspecified open wound, left foot, initial encounter: Secondary | ICD-10-CM | POA: Diagnosis not present

## 2019-08-27 ENCOUNTER — Other Ambulatory Visit: Payer: Self-pay

## 2019-08-27 ENCOUNTER — Encounter (HOSPITAL_BASED_OUTPATIENT_CLINIC_OR_DEPARTMENT_OTHER): Payer: BC Managed Care – PPO | Attending: Internal Medicine | Admitting: Internal Medicine

## 2019-08-27 DIAGNOSIS — G82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, complete: Secondary | ICD-10-CM | POA: Insufficient documentation

## 2019-08-27 DIAGNOSIS — L97512 Non-pressure chronic ulcer of other part of right foot with fat layer exposed: Secondary | ICD-10-CM | POA: Insufficient documentation

## 2019-08-27 DIAGNOSIS — G4730 Sleep apnea, unspecified: Secondary | ICD-10-CM | POA: Diagnosis not present

## 2019-08-27 DIAGNOSIS — I10 Essential (primary) hypertension: Secondary | ICD-10-CM | POA: Insufficient documentation

## 2019-08-27 DIAGNOSIS — L97522 Non-pressure chronic ulcer of other part of left foot with fat layer exposed: Secondary | ICD-10-CM | POA: Diagnosis not present

## 2019-08-27 DIAGNOSIS — L89322 Pressure ulcer of left buttock, stage 2: Secondary | ICD-10-CM | POA: Diagnosis not present

## 2019-08-27 NOTE — Progress Notes (Addendum)
Barret, OSIRUS BARDIN ([id number]) Visit Report for 08/27/2019 Arrival Information Details Patient Name: Date of Service: JUELL, KLINGEL 08/27/2019 7:30 AM Medical Record [id number] Patient Account Number: [id number] Date of Birth/Sex: Treating RN: Nov 12, 1986 (33 y.o. [name] Primary Care Navi Erber: [name], [name] Other Clinician: Sandre Kitty Referring Shon Mansouri: Treating Cambrea Kirt/Extender:Robson, [name], [name]: 190 Visit Information History Since Last Visit Added or deleted any medications: No Patient Arrived: Wheel Chair Any new allergies or adverse reactions: No Arrival Time: 07:47 Had a fall or experienced change in No activities of daily living that may affect Accompanied By: self risk of falls: Transfer Assistance: None Signs or symptoms of abuse/neglect since last No Patient Identification Verified: Yes visito Secondary Verification Process Completed: Yes Hospitalized since last visit: No Patient Requires Transmission-Based No Implantable device outside of the clinic excluding No Precautions: cellular tissue based products placed in the center Patient Has Alerts: Yes since last visit: Patient Alerts: R ABI = Has Dressing in Place as Prescribed: Yes 1.0 Has Compression in Place as Prescribed: Yes L ABI = Pain Present Now: No 1.1 Electronic Signature(s) Signed: 08/27/2019 5:53:08 PM By: Deon Pilling Entered By: Deon Pilling on 08/27/2019 07:47:54 -------------------------------------------------------------------------------- Encounter Discharge Information Details Patient Name: Date of Service: Blanda, [name] 08/27/2019 7:30 AM Medical Record [id number] Patient Account Number: [id number] Date of Birth/Sex: Treating RN: 08/22/1986 (33 y.o. [name] Primary Care Kera Deacon: [place], [place] Other Clinician: Sandre Kitty Referring Yarima Penman: Treating Alyda Megna/Extender:Robson, [name], [name] Weeks in  Treatment: 190 Encounter Discharge Information Items Post Procedure Vitals Discharge Condition: Stable Temperature (F): 98.2 Ambulatory Status: Wheelchair Pulse (bpm): 120 Discharge Destination: Home Respiratory Rate (breaths/min): 16 Transportation: Private Auto Blood Pressure (mmHg): 134/76 Accompanied By: self Schedule Follow-up Appointment: Yes Clinical Summary of Care: Electronic Signature(s) Signed: 08/27/2019 5:53:08 PM By: Deon Pilling Entered By: Deon Pilling on 08/27/2019 08:26:41 -------------------------------------------------------------------------------- Lower Extremity Assessment Details Patient Name: Date of Service: Maheu, ANSEL WOULFE 08/27/2019 7:30 AM Medical Record [id number] Patient Account Number: [id number] Date of Birth/Sex: Treating RN: 1986/10/20 (33 y.o. [name] Primary Care Parissa Chiao: [place], [place] Other Clinician: Sandre Kitty Referring Persephone Schriever: Treating Fraya Ueda/Extender:Robson, [name], [name]: 190 Edema Assessment Assessed: [Left: Yes] [Right: Yes] Edema: [Left: No] [Right: No] Calf Left: Right: Point of Measurement: 33 cm From Medial Instep 28.4 cm 33 cm Ankle Left: Right: Point of Measurement: 10 cm From Medial Instep 22.8 cm 23 cm Vascular Assessment Pulses: Dorsalis Pedis Palpable: [Left:Yes] [Right:Yes] Electronic Signature(s) Signed: 08/27/2019 5:53:08 PM By: Deon Pilling Entered By: Deon Pilling on 08/27/2019 07:50:39 -------------------------------------------------------------------------------- Multi Wound Chart Details Patient Name: Date of Service: Purnell, [name] 08/27/2019 7:30 AM Medical Record [id number] Patient Account Number: [id number] Date of Birth/Sex: Treating RN: 06-04-1986 (33 y.o. [name] Primary Care Zaara Sprowl: Other Clinician: Clemmie Krill, [place] Referring Naomee Nowland: Treating Sundance Moise/Extender:Robson, [name], [name] Weeks in  Treatment: 190 Vital Signs Height(in): 70 Pulse(bpm): 127 Weight(lbs): 216 Blood Pressure(mmHg): 134/76 Body Mass Index(BMI): 31 Temperature(F): 98.2 Respiratory 16 Rate(breaths/min): Photos: [17:No Photos] [24:No Photos] [35:No Photos] Wound Location: [17:Right Toe - Web between Left Foot - Dorsal 1st and 2nd - Dorsal] [35:Left Ischium] Wounding Event: [17:Gradually Appeared] [24:Gradually Appeared] [35:Gradually Appeared] Primary Etiology: [17:Atypical] [24:Inflammatory] [35:Pressure Ulcer] Comorbid History: [17:Sleep Apnea, Hypertension, Sleep Apnea, Hypertension, Sleep Apnea, Hypertension, Paraplegia] [24:Paraplegia] [35:Paraplegia] Date Acquired: [17:04/07/2017] [24:03/08/2018] [35:04/28/2019] Weeks of Treatment: [id number] [24:76] [35:17] Wound Status: [17:Open] [24:Open] [35:Open] Clustered Wound: [17:Yes] [24:No] [35:Yes] Clustered Quantity: [17:2] [24:N/A] [35:1] Measurements L x W x D  1.5x3x0.1 [24:2x3.8x0.1] [35:0.8x0.4x0.1] (cm) Area (cm) : [17:3.534] [24:5.969] [35:0.251] Volume (cm) : [17:0.353] [24:0.597] [35:0.025] % Reduction in Area: [17:-500.00%] [24:-2429.20%] [35:98.20%] % Reduction in Volume: -498.30% [24:-2387.50%] [35:98.20%] Classification: [17:Full Thickness Without Exposed Support Structures Exposed Support Structures] [24:Full Thickness Without] [35:Category/Stage II] Exudate Amount: [17:Medium] [24:Medium] [35:Medium] Exudate Type: [17:Serosanguineous] [24:Serosanguineous] [35:Serosanguineous] Exudate Color: [17:red, brown] [24:red, brown] [35:red, brown] Wound Margin: [17:Distinct, outline attached Distinct, outline attached Flat and Intact] Granulation Amount: [17:Large (67-100%)] [24:Large (67-100%)] [35:Large (67-100%)] Granulation Quality: [17:Red, Pink] [24:Pink] [35:Red, Friable] Necrotic Amount: [17:Small (1-33%)] [24:Small (1-33%)] [35:None Present (0%)] Exposed Structures: [17:Fat Layer (Subcutaneous Fat Layer (Subcutaneous Fat Layer  (Subcutaneous Tissue) Exposed: Yes Fascia: No Tendon: No Muscle: No Joint: No Bone: No] [24:Tissue) Exposed: Yes Fascia: No Tendon: No Muscle: No Joint: No Bone: No] [35:Tissue) Exposed: Yes  Fascia: No Tendon: No Muscle: No Joint: No Bone: No] Epithelialization: [17:Large (67-100%)] [24:Large (67-100%)] [35:Medium (34-66%)] Debridement: [17:N/A] [24:Debridement - Excisional N/A] Pre-procedure [17:N/A] [id number] [35:N/A] Verification/Time Out Taken: Tissue Debrided: [17:N/A] [24:Subcutaneous] [35:N/A] Level: [17:N/A] [24:Skin/Subcutaneous Tissue N/A] Debridement Area (sq cm):N/A [24:7.6] [35:N/A] Instrument: [17:N/A] [24:Curette] [35:N/A] Bleeding: [17:N/A] [24:None] [35:N/A] Procedural Pain: [17:N/A] [24:0] [35:N/A] Post Procedural Pain: [17:N/A] [24:0] [35:N/A] Debridement Treatment [17:N/A] [24:Procedure was tolerated] [35:N/A] Response: [24:well] Post Debridement [17:N/A] [24:2x3.8x0.1] [35:N/A] Measurements L x W x D (cm) Post Debridement [17:N/A] [24:0.597] [35:N/A] Volume: (cm) Procedures Performed: [17:N/A] [24:Debridement] [35:N/A] Treatment Notes Wound #17 (Right, Dorsal Toe - Web between 1st and 2nd) 1. Cleanse With Wound Cleanser 2. Periwound Care Antifungal cream Moisturizing lotion 3. Primary Dressing Applied Calcium Alginate Ag 4. Secondary Dressing Dry Gauze Roll Gauze 5. Secured With Tape 6. Support Layer Applied Compression garment Notes heel cup for protection to left heel. juxtalites bilateral lower legs. Wound #24 (Left, Dorsal Foot) 1. Cleanse With Wound Cleanser 2. Periwound Care Antifungal cream Moisturizing lotion 3. Primary Dressing Applied Calcium Alginate Ag 4. Secondary Dressing Dry Gauze Roll Gauze 5. Secured With Tape Notes heel cup for protection to left heel. Wound #35 (Left Ischium) 1. Cleanse With Wound Cleanser 2. Periwound Care Antifungal cream Moisturizing lotion 3. Primary Dressing Applied Hydrofera Blue 4.  Secondary Dressing Foam Border Dressing 5. Secured With Office manager) Signed: 08/27/2019 6:11:18 PM By: Linton Ham MD Signed: 08/27/2019 6:17:12 PM By: Levan Hurst RN, BSN Entered By: Linton Ham on 08/27/2019 08:29:51 -------------------------------------------------------------------------------- Multi-Disciplinary Care Plan Details Patient Name: Date of Service: CRIBBGervis, Hooter 08/27/2019 7:30 AM Medical Record [id number] Patient Account Number: [id number] Date of Birth/Sex: Treating RN: 1986-10-30 (32 y.o. [name] Primary Care Kebron Pulse: [place], [place] Other Clinician: Sandre Kitty Referring Denim Start: Treating [name]/Extender:Robson, [name], [name]: 190 Active Inactive Wound/Skin Impairment Nursing Diagnoses: Impaired tissue integrity Knowledge deficit related to ulceration/compromised skin integrity Goals: Patient/caregiver will verbalize understanding of skin care regimen Date Initiated: 01/05/2016 Target Resolution Date: 09/21/2019 Goal Status: Active Ulcer/skin breakdown will have a volume reduction of 30% by week 4 Date Initiated: 01/05/2016 Date Inactivated: 12/22/2017 Target Resolution Date: 01/19/2018 Unmet Reason: complex Goal Status: Unmet wounds, infection Interventions: Assess patient/caregiver ability to obtain necessary supplies Assess ulceration(s) every visit Provide education on ulcer and skin care Notes: Electronic Signature(s) Signed: 08/27/2019 6:17:12 PM By: Levan Hurst RN, BSN Entered By: Levan Hurst on 08/27/2019 07:48:17 -------------------------------------------------------------------------------- Pain Assessment Details Patient Name: Date of Service: Thain, [name] 08/27/2019 7:30 AM Medical Record [id number] Patient Account Number: [id number] Date of Birth/Sex: Treating RN: 1986-03-23 (33 y.o. [name] Primary Care Jaquise Faux: [place],  [place] Other  Clinician: Sandre Kitty Referring Quyen Cutsforth: Treating Meeyah Ovitt/Extender:Robson, [name], [name]: 190 Active Problems Location of Pain Severity and Description of Pain Patient Has Paino No Site Locations Rate the pain. Current Pain Level: 0 Pain Management and Medication Current Pain Management: Medication: No Cold Application: No Rest: No Massage: No Activity: No T.E.N.S.: No Heat Application: No Leg drop or elevation: No Is the Current Pain Management Adequate: Adequate How does your wound impact your activities of daily livingo Sleep: No Bathing: No Appetite: No Relationship With Others: No Bladder Continence: No Emotions: No Bowel Continence: No Work: No Toileting: No Drive: No Dressing: No Hobbies: No Electronic Signature(s) Signed: 08/27/2019 5:53:08 PM By: Deon Pilling Entered By: Deon Pilling on 08/27/2019 07:48:10 -------------------------------------------------------------------------------- Patient/Caregiver Education Details Patient Name: Date of Service: CRIBBGrace Bushy 10/5/2020andnbsp7:30 AM Medical Record [phone] Patient Account Number: [id number] Date of Birth/Gender: May 30, 1986 (33 y.o. M) Treating RN: Levan Hurst Primary Care Physician: Janine Limbo Other Clinician: Sandre Kitty Referring Physician: Treating Physician/Extender:Robson, Neoma Laming [name]: 190 Education Assessment Education Provided To: Patient Education Topics Provided Wound/Skin Impairment: Methods: Explain/Verbal Responses: State content correctly Electronic Signature(s) Signed: 08/27/2019 6:17:12 PM By: Levan Hurst RN, BSN Entered By: Levan Hurst on 08/27/2019 07:48:36 -------------------------------------------------------------------------------- Wound Assessment Details Patient Name: Date of Service: [place]kin, [name] 08/27/2019 7:30 AM Medical Record [id number] Patient Account  Number: [id number] Date of Birth/Sex: Treating RN: [birth year] (33 y.o. [name] Primary Care Giannis Corpuz: [place], [place] Other Clinician: Sandre Kitty Referring Onaje Warne: Treating Agueda Houpt/Extender:Robson, [name], [name]: 190 Wound Status Wound Number: 17 Primary Etiology: Atypical Wound Location: Right Toe - Web between 1st and Wound Status: Open 2nd - Dorsal Comorbid Sleep Apnea, Hypertension, Wounding Event: Gradually Appeared History: Paraplegia Date Acquired: 04/07/2017 Weeks Of Treatment: 124 Clustered Wound: Yes Photos Wound Measurements Length: (cm) 1.5 Width: (cm) 3 Depth: (cm) 0.1 Clustered Quantity: 2 Area: (cm) 3.534 Volume: (cm) 0.353 Wound Description Full Thickness Without Exposed Support Classification: Structures Wound Distinct, outline attached Margin: Exudate Medium Amount: Exudate Serosanguineous Type: Exudate red, brown Color: Wound Bed Granulation Amount: Large (67-100%) Granulation Quality: Red, Pink Necrotic Amount: Small (1-33%) Necrotic Quality: Adherent Slough Foul Odor After Cleansing: No Slough/Fibrino Yes Exposed Structure Fascia Exposed: No Fat Layer (Subcutaneous Tissue) Exposed: Yes Tendon Exposed: No Muscle Exposed: No Joint Exposed: No Bone Exposed: No % Reduction in Area: -500% % Reduction in Volume: -498.3% Epithelialization: Large (67-100%) Tunneling: No Undermining: No Treatment Notes Wound #17 (Right, Dorsal Toe - Web between 1st and 2nd) 1. Cleanse With Wound Cleanser 2. Periwound Care Antifungal cream Moisturizing lotion 3. Primary Dressing Applied Calcium Alginate Ag 4. Secondary Dressing Dry Gauze Roll Gauze 5. Secured With Tape 6. Support Layer Applied Compression garment Notes heel cup for protection to left heel. juxtalites bilateral lower legs. Electronic Signature(s) Signed: 08/28/2019 12:19:24 PM By: Mikeal Hawthorne EMT/HBOT Signed: 08/29/2019 6:07:42 PM By:  Deon Pilling Previous Signature: 08/27/2019 5:53:08 PM Version By: Deon Pilling Entered By: Mikeal Hawthorne on 08/28/2019 08:44:45 -------------------------------------------------------------------------------- Wound Assessment Details Patient Name: Date of Service: Mcglothen, Gilad E. 08/27/2019 7:30 AM Medical Record [id number] Patient Account Number: [id number] Date of Birth/Sex: Treating RN: 12/20/1985 (33 y.o. [name] Primary Care Aryannah Mohon: [place], [name] Other Clinician: Sandre Kitty Referring Beyla Loney: Treating Alvar Malinoski/Extender:Robson, [name], [name]: 190 Wound Status Wound Number: 24 Primary Etiology: Inflammatory Wound Location: Left Foot - Dorsal Wound Status: Open Wounding Event: Gradually Appeared Comorbid Sleep Apnea, Hypertension, History: Paraplegia Date Acquired: 03/08/2018 Weeks Of  Treatment: 76 Clustered Wound: No Photos Wound Measurements Length: (cm) 2 % Reduct Width: (cm) 3.8 % Reduct Depth: (cm) 0.1 Epitheli Area: (cm) 5.969 Tunneli Volume: (cm) 0.597 Undermi Wound Description Classification: Full Thickness Without Exposed Support Foul Odo Structures Slough/F Wound Distinct, outline attached Margin: Exudate Medium Amount: Exudate Serosanguineous Type: Exudate red, brown Color: Wound Bed Granulation Amount: Large (67-100%) Granulation Quality: Pink Fascia Necrotic Amount: Small (1-33%) Fat La Necrotic Quality: Adherent Slough Tendon Muscle Joint Bone E r After Cleansing: No ibrino Yes Exposed Structure Exposed: No yer (Subcutaneous Tissue) Exposed: Yes Exposed: No Exposed: No Exposed: No xposed: No ion in Area: -2429.2% ion in Volume: -2387.5% alization: Large (67-100%) ng: No ning: No Treatment Notes Wound #24 (Left, Dorsal Foot) 1. Cleanse With Wound Cleanser 2. Periwound Care Antifungal cream Moisturizing lotion 3. Primary Dressing Applied Calcium Alginate Ag 4. Secondary  Dressing Dry Gauze Roll Gauze 5. Secured With Tape Notes heel cup for protection to left heel. Electronic Signature(s) Signed: 08/28/2019 12:19:24 PM By: Mikeal Hawthorne EMT/HBOT Signed: 08/29/2019 6:07:42 PM By: Deon Pilling Previous Signature: 08/27/2019 5:53:08 PM Version By: Deon Pilling Entered By: Mikeal Hawthorne on 08/28/2019 08:45:22 -------------------------------------------------------------------------------- Wound Assessment Details Patient Name: Date of Service: Walz, Endi E. 08/27/2019 7:30 AM Medical Record [id number] Patient Account Number: [id number] Date of Birth/Sex: Treating RN: 1985/12/20 (33 y.o. [name] Primary Care Avika Carbine: [place], [place] Other Clinician: Sandre Kitty Referring Keath Matera: Treating Dicky Boer/Extender:Robson, [name], [name]: 190 Wound Status Wound Number: 35 Primary Etiology: Pressure Ulcer Wound Location: Left Ischium Wound Status: Open Wounding Event: Gradually Appeared Comorbid Sleep Apnea, Hypertension, History: Paraplegia Date Acquired: 04/28/2019 Weeks Of Treatment: 17 Clustered Wound: Yes Photos Wound Measurements Length: (cm) 0.8 % Reducti Width: (cm) 0.4 % Reducti Depth: (cm) 0.1 Epithelia Clustered Quantity: 1 Tunneling Area: (cm) 0.251 Undermin Volume: (cm) 0.025 Wound Description Classification: Category/Stage II Wound Margin: Flat and Intact Exudate Amount: Medium Exudate Type: Serosanguineous Exudate Color: red, brown Wound Bed Granulation Amount: Large (67-100%) Granulation Quality: Red, Friable Necrotic Amount: None Present (0%) Foul Odor After Cleansing: No Slough/Fibrino No Exposed Structure Fascia Exposed: No Fat Layer (Subcutaneous Tissue) Exposed: Yes Tendon Exposed: No Muscle Exposed: No Joint Exposed: No Bone Exposed: No on in Area: 98.2% on in Volume: 98.2% lization: Medium (34-66%) : No ing: No Treatment Notes Wound #35 (Left Ischium) 1. Cleanse  With Wound Cleanser 2. Periwound Care Antifungal cream Moisturizing lotion 3. Primary Dressing Applied Hydrofera Blue 4. Secondary Dressing Foam Border Dressing 5. Secured With Office manager) Signed: 08/28/2019 12:19:24 PM By: Mikeal Hawthorne EMT/HBOT Signed: 08/29/2019 6:07:42 PM By: Deon Pilling Previous Signature: 08/27/2019 5:53:08 PM Version By: Deon Pilling Entered By: Mikeal Hawthorne on 08/28/2019 08:48:52 -------------------------------------------------------------------------------- Vitals Details Patient Name: Date of Service: Masci, Mareon E. 08/27/2019 7:30 AM Medical Record [id number] Patient Account Number: [id number] Date of Birth/Sex: Treating RN: [birth year] (33 y.o. [name] Primary Care Kaleigh Spiegelman: [place], [place] Other Clinician: Sandre Kitty Referring Janna Oak: Treating Akshat Minehart/Extender:Robson, [name], [name]: 190 Vital Signs Time Taken: 07:48 Temperature (F): 98.2 Height (in): 70 Pulse (bpm): 127 Weight (lbs): 216 Respiratory Rate (breaths/min): 16 Body Mass Index (BMI): 31 Blood Pressure (mmHg): 134/76 Reference Range: 80 - 120 mg / dl Notes will recheck pulse rate. Electronic Signature(s) Signed: 08/27/2019 5:53:08 PM By: Deon Pilling Entered By: Deon Pilling on 08/27/2019 07:50:28

## 2019-08-30 DIAGNOSIS — R635 Abnormal weight gain: Secondary | ICD-10-CM | POA: Diagnosis not present

## 2019-08-30 DIAGNOSIS — I10 Essential (primary) hypertension: Secondary | ICD-10-CM | POA: Diagnosis not present

## 2019-08-30 DIAGNOSIS — Z6829 Body mass index (BMI) 29.0-29.9, adult: Secondary | ICD-10-CM | POA: Diagnosis not present

## 2019-08-30 NOTE — Progress Notes (Signed)
CRIBBColetin, Sigrist ([id number]) Visit Report for 08/13/2019 Arrival Information Details Patient Name: Date of Service: [name], [name] 08/13/2019 7:30 AM Medical Record [id number] Patient Account Number: [id number] Date of Birth/Sex: Treating RN: 02/15/1986 (33 y.o. [name] Primary Care Yazhini Mcaulay: [name], [name] Other Clinician: Referring Mathea Frieling: Treating Luvern Mischke/Extender:Robson, [name], [name]: 188 Visit Information History Since Last Visit Added or deleted any medications: No Patient Arrived: Wheel Chair Any new allergies or adverse reactions: No Arrival Time: 07:44 Had a fall or experienced change in No activities of daily living that may affect Accompanied By: self risk of falls: Transfer Assistance: None Signs or symptoms of abuse/neglect since last No Patient Identification Verified: Yes visito Secondary Verification Process Completed: Yes Hospitalized since last visit: No Patient Requires Transmission-Based No Implantable device outside of the clinic excluding No Precautions: cellular tissue based products placed in the center Patient Has Alerts: Yes since last visit: Patient Alerts: R ABI = Has Dressing in Place as Prescribed: Yes 1.0 Has Compression in Place as Prescribed: Yes L ABI = Pain Present Now: No 1.1 Electronic Signature(s) Signed: 08/13/2019 4:26:37 PM By: Deon Pilling Entered By: Deon Pilling on 08/13/2019 08:01:46 -------------------------------------------------------------------------------- Encounter Discharge Information Details Patient Name: Date of Service: [name] 08/13/2019 7:30 AM Medical Record [id number] Patient Account Number: [id number] Date of Birth/Sex: Treating RN: 09-May-1986 (33 y.o. [name] Primary Care Eilyn Polack: [place], [place] Other Clinician: Referring Anida Deol: Treating Latonja Bobeck/Extender:Robson, [name], [name]: 188 Encounter Discharge  Information Items Post Procedure Vitals Discharge Condition: Stable Temperature (F): 98.3 Ambulatory Status: Wheelchair Pulse (bpm): 123 Discharge Destination: Home Respiratory Rate (breaths/min): 16 Transportation: Private Auto Blood Pressure (mmHg): 114/64 Accompanied By: self Schedule Follow-up Appointment: Yes Clinical Summary of Care: Patient Declined Electronic Signature(s) Signed: 08/30/2019 4:32:31 PM By: Kela Millin Entered By: Kela Millin on 08/13/2019 08:34:31 -------------------------------------------------------------------------------- Lower Extremity Assessment Details Patient Name: Date of Service: [name], [name] 08/13/2019 7:30 AM Medical Record [id number] Patient Account Number: [id number] Date of Birth/Sex: Treating RN: February 10, 1986 (33 y.o. [name] Primary Care Deshaun Schou: [place], [place] Other Clinician: Referring Townsend Cudworth: Treating Boots Mcglown/Extender:Robson, [name], [name]: 188 Edema Assessment Assessed: [Left: Yes] [Right: Yes] Edema: [Left: No] [Right: Yes] Calf Left: Right: Point of Measurement: 33 cm From Medial Instep 28.5 cm 34 cm Ankle Left: Right: Point of Measurement: 10 cm From Medial Instep 23.5 cm 24.5 cm Electronic Signature(s) Signed: 08/13/2019 4:26:37 PM By: Deon Pilling Entered By: Deon Pilling on 08/13/2019 08:02:39 -------------------------------------------------------------------------------- Multi Wound Chart Details Patient Name: Date of Service: [name] 08/13/2019 7:30 AM Medical Record [id number] Patient Account Number: [id number] Date of Birth/Sex: Treating RN: 05/28/1986 (33 y.o. [name] Primary Care Shareese Macha: [name], [name] Other Clinician: Referring Jamone Garrido: Treating Gerren Hoffmeier/Extender:Robson, [name], [name]: 188 Vital Signs Height(in): 70 Pulse(bpm): 123 Weight(lbs): 216 Blood Pressure(mmHg): 114/64 Body Mass Index(BMI):  31 Temperature(F): 98.3 Respiratory 16 Rate(breaths/min): Photos: [17:No Photos] [24:No Photos] [35:No Photos] Wound Location: [17:Right Toe - Web between Left Foot - Dorsal 1st and 2nd - Dorsal] [35:Left Ischium] Wounding Event: [17:Gradually Appeared] [24:Gradually Appeared] [35:Gradually Appeared] Primary Etiology: [17:Atypical] [24:Inflammatory] [35:Pressure Ulcer] Comorbid History: [17:Sleep Apnea, Hypertension, Sleep Apnea, Hypertension, Sleep Apnea, Hypertension, Paraplegia] [24:Paraplegia] [35:Paraplegia] Date Acquired: [17:04/07/2017] [24:03/08/2018] [35:04/28/2019] Weeks of Treatment: [17:122] [24:74] [35:15] Wound Status: [17:Open] [24:Open] [35:Open] Clustered Wound: [17:No] [24:No] [35:Yes] Clustered Quantity: [17:N/A] [24:N/A] [35:1] Measurements L x W x D 1.2x0.5x0.1 [24:1x0.6x0.1] [35:1.9x0.8x0.1] (cm) Area (cm) : [17:0.471] [24:0.471] [35:1.194] Volume (cm) : [17:0.047] [  24:0.047] [35:0.119] % Reduction in Area: [17:20.00%] [24:-99.60%] [35:91.60%] % Reduction in Volume: 20.30% [24:-95.80%] [35:91.60%] Classification: [17:Full Thickness Without Exposed Support Structures Exposed Support Structures] [24:Full Thickness Without] [35:Category/Stage II] Exudate Amount: [17:Medium] [24:Medium] [35:Medium] Exudate Type: [17:Serosanguineous] [24:Serosanguineous] [35:Serosanguineous] Exudate Color: [17:red, brown] [24:red, brown] [35:red, brown] Wound Margin: [17:Distinct, outline attached Distinct, outline attached Flat and Intact] Granulation Amount: [17:Large (67-100%)] [24:Large (67-100%)] [35:Large (67-100%)] Granulation Quality: [17:Red, Pink] [24:Pink] [35:Red, Friable] Necrotic Amount: [17:Small (1-33%)] [24:Small (1-33%)] [35:None Present (0%)] Exposed Structures: [17:Fat Layer (Subcutaneous Fat Layer (Subcutaneous Fat Layer (Subcutaneous Tissue) Exposed: Yes Fascia: No Tendon: No Muscle: No Joint: No Bone: No] [24:Tissue) Exposed: Yes Fascia: No Tendon: No Muscle: No  Joint: No Bone: No] [35:Tissue) Exposed: Yes  Fascia: No Tendon: No Muscle: No Joint: No Bone: No] Epithelialization: [17:Large (67-100%)] [24:Large (67-100%)] [35:Medium (34-66%)] Debridement: [17:N/A] [24:Debridement - Excisional N/A] Pre-procedure [17:N/A] [24:08:13] [35:N/A] Verification/Time Out Taken: Tissue Debrided: [17:N/A] [24:Subcutaneous] [35:N/A] Level: [17:N/A] [24:Skin/Subcutaneous Tissue N/A] Debridement Area (sq cm):N/A [24:0.6] [35:N/A] Instrument: [17:N/A] [24:Curette] [35:N/A] Bleeding: [17:N/A] [24:Minimum] [35:N/A] Hemostasis Achieved: [17:N/A] [24:Pressure] [35:N/A] Procedural Pain: [17:N/A] [24:0] [35:N/A] Post Procedural Pain: [17:N/A] [24:0] [35:N/A] Debridement Treatment N/A [24:Procedure was tolerated] [35:N/A] Response: [24:well] Post Debridement [17:N/A] [24:1x0.6x0.1] [35:N/A] Measurements L x W x D (cm) Post Debridement [17:N/A] [24:0.047] [35:N/A] Volume: (cm) Assessment Notes: [17:maceration noted to periwound. N/A] [24:maceration noted to periwound. Debridement] [35:N/A N/A] Treatment Notes Wound #17 (Right, Dorsal Toe - Web between 1st and 2nd) 1. Cleanse With Wound Cleanser Soap and water 2. Periwound Care Skin Prep 3. Primary Dressing Applied Calcium Alginate Ag 4. Secondary Dressing Roll Gauze Heel Cup 5. Secured With Tape Wound #24 (Left, Dorsal Foot) 1. Cleanse With Wound Cleanser Soap and water 2. Periwound Care Skin Prep 3. Primary Dressing Applied Calcium Alginate Ag 4. Secondary Dressing Roll Gauze 5. Secured With Tape Wound #35 (Left Ischium) 1. Cleanse With Wound Cleanser 2. Periwound Care Skin Prep 3. Primary Dressing Applied Hydrofera Blue 4. Secondary Dressing Foam Border Dressing Electronic Signature(s) Signed: 08/13/2019 5:16:07 PM By: Linton Ham MD Signed: 08/13/2019 5:57:50 PM By: Levan Hurst RN, BSN Entered By: Linton Ham on 08/13/2019  08:38:08 -------------------------------------------------------------------------------- Multi-Disciplinary Care Plan Details Patient Name: Date of Service: Corderio, [place] 08/13/2019 7:30 AM Medical Record [id number] Patient Account Number: [id number] Date of Birth/Sex: Treating RN: 1986/04/04 (33 y.o. [name] Primary Care Vanity Larsson: [name], [name] Other Clinician: Referring Loyda Costin: Treating Janeth Terry/Extender:Robson, [name], [name]: 188 Active Inactive Wound/Skin Impairment Nursing Diagnoses: Impaired tissue integrity Knowledge deficit related to ulceration/compromised skin integrity Goals: Patient/caregiver will verbalize understanding of skin care regimen Date Initiated: 01/05/2016 Target Resolution Date: 08/24/2019 Goal Status: Active Ulcer/skin breakdown will have a volume reduction of 30% by week 4 Date Initiated: 01/05/2016 Date Inactivated: 12/22/2017 Target Resolution Date: 01/19/2018 Unmet Reason: complex Goal Status: Unmet wounds, infection Interventions: Assess patient/caregiver ability to obtain necessary supplies Assess ulceration(s) every visit Provide education on ulcer and skin care Notes: Electronic Signature(s) Signed: 08/13/2019 5:57:50 PM By: Levan Hurst RN, BSN Entered By: Levan Hurst on 08/13/2019 08:12:15 -------------------------------------------------------------------------------- Pain Assessment Details Patient Name: Date of Service: [name] 08/13/2019 7:30 AM Medical Record [id number] Patient Account Number: [id number] Date of Birth/Sex: Treating RN: 1986/05/01 (33 y.o. [name] Primary Care Ulyess Muto: [place], [place] Other Clinician: Referring Cross Jorge: Treating Greysen Swanton/Extender:Robson, [name], [name]: 188 Active Problems Location of Pain Severity and Description of Pain Patient Has Paino No Site Locations Rate the pain. Current Pain Level: 0 Pain  Management and Medication Current Pain Management: Medication: No Cold Application: No Rest: No Massage: No Activity: No T.E.N.S.: No Heat Application: No Leg drop or elevation: No Is the Current Pain Management Adequate: Inadequate How does your wound impact your activities of daily livingo Sleep: No Bathing: No Appetite: No Relationship With Others: No Bladder Continence: No Emotions: No Bowel Continence: No Work: No Toileting: No Drive: No Dressing: No Hobbies: No Electronic Signature(s) Signed: 08/13/2019 4:26:37 PM By: Deon Pilling Entered By: Deon Pilling on 08/13/2019 08:02:19 -------------------------------------------------------------------------------- Patient/Caregiver Education Details Patient Name: Date of Service: [name] 9/21/2020andnbsp7:30 AM Medical Record [id number] Patient Account Number: [id number] Date of Birth/Gender: November 07, 1986 (33 y.o. M) Treating RN: Levan Hurst Primary Care Physician: Janine Limbo Other Clinician: Referring Physician: Treating Physician/Extender:Robson, Pecola Leisure in Treatment: [place] Education Assessment Education Provided To: Patient Education Topics Provided Wound/Skin Impairment: Methods: Explain/Verbal Responses: State content correctly Electronic Signature(s) Signed: 08/13/2019 5:57:50 PM By: Levan Hurst RN, BSN Entered By: Levan Hurst on 08/13/2019 08:12:30 -------------------------------------------------------------------------------- Wound Assessment Details Patient Name: Date of Service: [name] 08/13/2019 7:30 AM Medical Record [id number] Patient Account Number: [id number] Date of Birth/Sex: Treating RN: 1986/01/20 (33 y.o. [name] Primary Care Kadin Canipe: [name], [name] Other Clinician: Referring Tristain Daily: Treating Linn Goetze/Extender:Robson, [name], [name]: 188 Wound Status Wound Number: 17 Primary Etiology:  Atypical Wound Location: Right Toe - Web between 1st and Wound Status: Open 2nd - Dorsal Comorbid Sleep Apnea, Hypertension, Wounding Event: Gradually Appeared History: Paraplegia Date Acquired: 04/07/2017 Weeks Of Treatment: 122 Clustered Wound: No Photos Wound Measurements Length: (cm) 1.2 % Reduct Width: (cm) 0.5 % Reduct Depth: (cm) 0.1 Epitheli Area: (cm) 0.471 Tunneli Volume: (cm) 0.047 Undermi Wound Description Classification: Full Thickness Without Exposed Support Foul Od Structures Slough/ Wound Distinct, outline attached Margin: Exudate Exudate Medium Amount: Exudate Serosanguineous Type: Exudate red, brown Color: Wound Bed Granulation Amount: Large (67-100%) Granulation Quality: Red, Pink Fascia Necrotic Amount: Small (1-33%) Fat Lay Necrotic Quality: Adherent Slough Tendon Muscle Joint E Bone Ex or After Cleansing: No Fibrino Yes Exposed Structure Exposed: No er (Subcutaneous Tissue) Exposed: Yes Exposed: No Exposed: No xposed: No posed: No ion in Area: 20% ion in Volume: 20.3% alization: Large (67-100%) ng: No ning: No Assessment Notes maceration noted to periwound. Electronic Signature(s) Signed: 08/14/2019 5:45:36 PM By: Deon Pilling Signed: 08/17/2019 8:42:43 AM By: Mikeal Hawthorne EMT/HBOT Previous Signature: 08/13/2019 4:26:37 PM Version By: Deon Pilling Entered By: Mikeal Hawthorne on 08/14/2019 08:44:46 -------------------------------------------------------------------------------- Wound Assessment Details Patient Name: Date of Service: Jahzier, Lala 08/13/2019 7:30 AM Medical Record [id number] Patient Account Number: [id number] Date of Birth/Sex: Treating RN: Aug 19, 1986 (33 y.o. [name] Primary Care Jerryl Holzhauer: [place], [place] Other Clinician: Referring Lexx Monte: Treating Gabrianna Fassnacht/Extender:Robson, [name], [name]: 188 Wound Status Wound Number: 24 Primary Etiology: Inflammatory Wound  Location: Left Foot - Dorsal Wound Status: Open Wounding Event: Gradually Appeared Comorbid Sleep Apnea, Hypertension, History: Paraplegia Date Acquired: 03/08/2018 Weeks Of Treatment: 74 Clustered Wound: No Photos Wound Measurements Length: (cm) 1 % Reduc Width: (cm) 0.6 % Reduc Depth: (cm) 0.1 Epithel Area: (cm) 0.471 Tunnel Volume: (cm) 0.047 Underm Wound Description Full Thickness Without Exposed Support Classification: Structures Wound Distinct, outline attached Margin: Exudate Medium Amount: Exudate Serosanguineous Type: Exudate red, brown Color: Wound Bed Granulation Amount: Large (67-100%) Granulation Quality: Pink Necrotic Amount: Small (1-33%) Necrotic Quality: Adherent Slough Foul Odor After Cleansing: No Slough/Fibrino Yes Exposed Structure Fascia Exposed: No Fat Layer (Subcutaneous Tissue) Exposed: Yes Tendon Exposed:  No Muscle Exposed: No Joint Exposed: No Bone Exposed: No tion in Area: -99.6% tion in Volume: -95.8% ialization: Large (67-100%) ing: No ining: No Assessment Notes maceration noted to periwound. Electronic Signature(s) Signed: 08/14/2019 5:45:36 PM By: Deon Pilling Signed: 08/17/2019 8:42:43 AM By: Mikeal Hawthorne EMT/HBOT Previous Signature: 08/13/2019 4:26:37 PM Version By: Deon Pilling Entered By: Mikeal Hawthorne on 08/14/2019 08:41:00 -------------------------------------------------------------------------------- Wound Assessment Details Patient Name: Date of Service: Chap, Baldivia 08/13/2019 7:30 AM Medical Record [id number] Patient Account Number: [id number] Date of Birth/Sex: Treating RN: 1986/06/01 (33 y.o. [name] Primary Care Rianne Degraaf: [place], [place] Other Clinician: Referring Shon Indelicato: Treating Doyne Micke/Extender:Robson, [name], [name]: 188 Wound Status Wound Number: 35 Primary Etiology: Pressure Ulcer Wound Location: Left Ischium Wound Status: Open Wounding Event:  Gradually Appeared Comorbid Sleep Apnea, Hypertension, History: Paraplegia Date Acquired: 04/28/2019 Weeks Of Treatment: 15 Clustered Wound: Yes Photos Wound Measurements Length: (cm) 1.9 % Reducti Width: (cm) 0.8 % Reducti Depth: (cm) 0.1 Epithelia Clustered Quantity: 1 Tunneling Area: (cm) 1.194 Undermin Volume: (cm) 0.119 Wound Description Classification: Category/Stage II Wound Margin: Flat and Intact Exudate Amount: Medium Exudate Type: Serosanguineous Exudate Color: red, brown Wound Bed Granulation Amount: Large (67-100%) Granulation Quality: Red, Friable Necrotic Amount: None Present (0%) Foul Odor After Cleansing: No Slough/Fibrino No Exposed Structure Fascia Exposed: No Fat Layer (Subcutaneous Tissue) Exposed: Yes Tendon Exposed: No Muscle Exposed: No Joint Exposed: No Bone Exposed: No on in Area: 91.6% on in Volume: 91.6% lization: Medium (34-66%) : No ing: No Electronic Signature(s) Signed: 08/14/2019 5:45:36 PM By: Deon Pilling Signed: 08/17/2019 8:42:43 AM By: Mikeal Hawthorne EMT/HBOT Previous Signature: 08/13/2019 4:26:37 PM Version By: Deon Pilling Entered By: Mikeal Hawthorne on 08/14/2019 08:45:19 -------------------------------------------------------------------------------- Vitals Details Patient Name: Date of Service: [name] 08/13/2019 7:30 AM Medical Record [id number] Patient Account Number: [id number] Date of Birth/Sex: Treating RN: 07-26-1986 (33 y.o. [name] Primary Care Freddi Schrager: [name], [name] Other Clinician: Referring Tareq Dwan: Treating Seibert Keeter/Extender:Robson, [name], [name]: 188 Vital Signs Time Taken: 07:45 Temperature (F): 98.3 Height (in): 70 Pulse (bpm): 123 Weight (lbs): 216 Respiratory Rate (breaths/min): 16 Body Mass Index (BMI): 31 Blood Pressure (mmHg): 114/64 Reference Range: 80 - 120 mg / dl Electronic Signature(s) Signed: 08/13/2019 4:26:37 PM By: Deon Pilling Entered By: Deon Pilling on 08/13/2019 08:02:07

## 2019-08-30 NOTE — Progress Notes (Signed)
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 dressing attempts. He also has areas in his first and second toes bilaterally in the anterior and posterior web space. I had been using Iodoflex here after a prolonged course of silver alginate with ketoconazole was ineffective [question tinea pedis] 07/14/17; patient arrives today with a very difficult adherent material over his left lateral lower leg wound. He also has surrounding erythema and poorly controlled edema. He was switched his Santyl last visit which the nurses are applying once during his doctor visit and once on a nurse visit. He was also reduced to 2 layer compression I'm not exactly sure of the issue here. 07/21/17; better surface today after 1 week of Iodoflex. Significant cellulitis that we treated last week also better. [Doxycycline] 07/28/17 better surface today with now 2 weeks of Iodoflex. Significant cellulitis treated with doxycycline. He has now completed the doxycycline and he is back to his usual degree of chronic venous inflammation/stasis dermatitis. He reminds me  he has had ablations surgery here 08/04/17; continued improvement with Iodoflex to the left lateral leg wound in terms of the  surface of the wound although the dimensions are better. 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 Problematic areas on the plantar aspect of the first second toe web space on the left and the dorsal aspect of the first second toe web space on the right. At one point I felt these were probably related to chronic fungal infections in treated him aggressively for this although we have not made any improvement here. 08/11/17; left lateral leg. Surface continues to improve with the Iodoflex although we are not seeing much improvement in overall wound dimensions. Areas on his plantar left foot and right foot show no improvement. In fact the right foot looks somewhat worse 08/18/17; left lateral leg. We changed to [facility] Blue last week after a prolonged course of Iodoflex which helps get the surface better. It appears that the wound with is improved. Continue with difficult areas on the left dorsal first second and plantar first second on the right 09/01/17; patient arrives in clinic today having had a temperature of 103 yesterday. He was seen in the ER and [facility] The patient was concerned he could have cellulitis again in the right leg however they diagnosed him with a UTI and he is now on Keflex. He has a history of cellulitis which is been recurrent and difficult but this is been in the left leg, in the past 5 use doxycycline. He does in and out catheterizations at home which are risk factors for UTI 09/08/17; patient will be completing his Keflex this weekend. The erythema on the left leg is considerably better. He has a new wound today on the medial part of the right leg small superficial almost looks like a skin tear. He has worsening of the area on the right dorsal first and second toe. His major area on the left lateral leg is better. Using Hydrofera Blue on all areas 09/15/17; gradual reduction in width on the long  wound in the left lateral leg. No debridement required. He also has wounds on the plantar aspect of his left first second toe web space and on the dorsal aspect of the right first second toe web space. 09/22/17; there continues to be very gradual improvements in the dimensions of the left lateral leg wound. He hasn't round erythematous spot with might be pressure on his wheelchair. There is no evidence obviously of infection no purulence no warmth He has a dry scaled area on the plantar aspect of the left first second toe Improved area on the dorsal right first second toe. 09/29/17; left lateral leg wound continues to improve in dimensions mostly with an is still a fairly long but increasingly narrow wound. He has a dry scaled area on the plantar aspect of his left first second toe web space Increasingly concerning area on the dorsal right first second toe. In fact I am concerned today about possible cellulitis around this wound. The areas extending up his second toe and although there is deformities here almost appears to abut on the nailbed. 10/06/17; left lateral leg wound continues to make very gradual progress. Tissue culture I did from the right first second toe dorsal foot last time grew MRSA and enterococcus which was vancomycin sensitive. This was not sensitive to clindamycin or doxycycline. He is allergic to Zyvox and sulfa we have therefore arrange for him  to have dalvance infusion tomorrow. He is had this in the past and tolerated it well 10/20/17; left lateral leg wound continues to make decent progress. This is certainly reduced in terms of with there is advancing epithelialization.The cellulitis in the right foot looks better although he still has a deep wound in the dorsal aspect of the first second toe web space. Plantar left first toe web space on the left I think is making some progress 10/27/17; left lateral leg wound continues to make decent progress. Advancing  epithelialization.using Hydrofera Blue The right first second toe web space wound is better-looking using silver alginate Improvement in the left plantar first second toe web space. Again using silver alginate 11/03/17 left lateral leg wound continues to make decent progress albeit slowly. Using [facility] The right per second toe web space continues to be a very problematic looking punched out wound. I obtained a piece of tissue for deep culture I did extensively treated this for fungus. It is difficult to imagine that this is a pressure area as the patient states other than going outside he doesn't really wear shoes at home The left plantar first second toe web space looked fairly senescent. Necrotic edges. This required debridement change to [facility] Blue to all wound areas 11/10/17; left lateral leg wound continues to contract. Using Hydrofera Blue On the right dorsal first second toe web space dorsally. Culture I did of this area last week grew MRSA there is not an easy oral option in this patient was multiple antibiotic allergies or intolerances. This was only a rare culture isolate I'm therefore going to use Bactroban under silver alginate On the left plantar first second toe web space. Debridement is required here. This is also unchanged 11/17/17; left lateral leg wound continues to contract using Hydrofera Blue this is no longer the major issue. The major concern here is the right first second toe web space. He now has an open area going from dorsally to the plantar aspect. There is now wound on the inner lateral part of the first toe. Not a very viable surface on this. There is erythema spreading medially into the forefoot. No major change in the left first second toe plantar wound 11/24/17; left lateral leg wound continues to contract using Hydrofera Blue. Nice improvement today The right first second toe web space all of this looks a lot less angry than last week. I have given him  clindamycin and topical Bactroban for MRSA and terbinafine for the possibility of underlining tinea pedis that I could not control with ketoconazole. Looks somewhat better The area on the plantar left first second toe web space is weeping with dried debris around the wound 12/01/17; left lateral leg wound continues to contract he Hydrofera Blue. It is becoming thinner in terms of with nevertheless it is making good improvement. The right first second toe web space looks less angry but still a large necrotic-looking wounds starting on the plantar aspect of the right foot extending between the toes and now extensively on the base of the right second toe. I gave him clindamycin and topical Bactroban for MRSA anterior benefiting for the possibility of underlying tinea pedis. Not looking better today The area on the left first/second toe looks better. Debrided of necrotic debris 12/05/17* the patient was worked in urgently today because over the weekend he found blood on his incontinence bad when he woke up. He was found to have an ulcer by his wife who does most of his wound  care. He came in today for [place] to look at this. He has not had a history of wounds in his buttocks in spite of his paraplegia. 12/08/17; seen in follow-up today at his usual appointment. He was seen earlier this week and found to have a new wound on his buttock. We also follow him for wounds on the left lateral leg, left first second toe web space and right first second toe web space 12/15/17; we have been using Hydrofera Blue to the left lateral leg which has improved. The right first second toe web space has also improved. Left first second toe web space plantar aspect looks stable. The left buttock has worsened using Santyl. Apparently the buttock has drainage 12/22/17; we have been using Hydrofera Blue to the left lateral leg which continues to improve now 2 small wounds separated by normal skin. He tells [place] he had a fever up to  100 yesterday he is prone to UTIs but has not noted anything different. He does in and out catheterizations. The area between the first and second toes today does not look good necrotic surface covered with what looks to be purulent drainage and erythema extending into the third toe. I had gotten this to something that I thought look better last time however it is not look good today. He also has a necrotic surface over the buttock wound which is expanded. I thought there might be infection under here so I removed a lot of the surface with a #5 curet though nothing look like it really needed culturing. He is been using Santyl to this area 12/27/17; his original wound on the left lateral leg continues to improve using Hydrofera Blue. I gave him samples of Baxdella although he was unable to take them out of fear for an allergic reaction ["lump in his throat"].the culture I did of the purulent drainage from his second toe last week showed both enterococcus and a set Enterobacter I was also concerned about the erythema on the bottom of his foot although paradoxically although this looks somewhat better today. 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 We've been using silver alginate here. They came up today that he sleeps in a recliner, I'm not sure why but I asked him to stop this 01/03/18; his original wound we've been using Hydrofera Blue is now separated into 2 areas. Ulcer on his left buttock is better he is off the recliner and sleeping in bed Finally both wound areas between his first and second toes also looks some better 01/10/18; his original wound on the left lateral leg is now separated into 2 wounds we've been using Hydrofera Blue Ulcer on his left buttock has some drainage. There is a small probing site going into muscle layer superiorly.using silver alginate -He arrives today with a deep tissue  injury on the left heel The wound on the dorsal aspect of his first second toe on the left looks a lot betterusing silver alginate ketoconazole The area on the first second toe web space on the right also looks a lot bette 01/17/18; his original wound on the left lateral leg continues to progress using Hydrofera Blue Ulcer on his left buttock also is smaller surface healthier except for a small probing site going into the muscle layer superiorly. 2.4 cm of tunneling in this area DTI on his left heel we have only been offloading. Looks better than last week  no threatened open no evidence of infection the wound on the dorsal aspect of the first second toe on the left continues to look like it's regressing we have only been using silver alginate and terbinafine orally The area in the first second toe web space on the right also looks to be a lot better using silver alginate and terbinafine I think this was prompted by tinea pedis 01/31/18; the patient was hospitalized in [place] last week apparently for a complicated UTI. He was discharged on cefepime he does in and out catheterizations. In the hospital he was discovered M I don't mild elevation of AST and ALTs and the terbinafine was stopped.predictably the pressure ulcer on his buttock looks betterusing silver alginate. The area on the left lateral leg also is better using Hydrofera Blue. The area between the first and second toes on the left better. First and second toes on the right still substantial but better. Finally the DTI on the left heel has held together and looks like it's resolving 02/07/18-he is here in follow-up evaluation for multiple ulcerations. He has new injury to the lateral aspect of the last issue a pressure ulcer, he states this is from adhesive removal trauma. He states he has tried multiple adhesive products with no success. All other ulcers appear stable. The left heel DTI is resolving. We will continue with  same treatment plan and follow-up next week. 02/14/18; follow-up for multiple areas. He has a new area last week on the lateral aspect of his pressure ulcer more over the posterior trochanter. The original pressure ulcer looks quite stable has healthy granulation. 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 Almost fully epithelialized on the original superior area using Hydrofera Blue DTI on the left heel has peeled off this week to reveal a small superficial wound under denuded skin and subcutaneous tissue Both areas between the first and second toes look better including nothing open on the left 02/21/18; The patient's wounds on his left ischial tuberosity and posterior left greater trochanter actually looked better. He has a large area of irritation around the area which I think is contact dermatitis. I am doubtful that this is fungal His original wound on the left lateral calf continues to improve we have been using Hydrofera Blue There is no open area in the left first second toe web space although there is a lot of thick callus The DTI on the left heel required debridement today of necrotic surface eschar and subcutaneous tissue using silver alginate Finally the area on the right first second toe webspace continues to contract using silver alginate and ketoconazole 02/28/18 Left ischial tuberosity wounds look better using silver alginate. Original wound on the left calf only has one small open area left using Hydrofera Blue DTI on the left heel required debridement mostly removing skin from around this wound surface. Using silver alginate The areas on the right first/second toe web space using silver alginate and ketoconazole 03/08/18 on evaluation today patient appears to be doing decently well as best I can tell in regard to his wounds. This is the first time that I have seen him as he  generally is followed by Dr. Dellia Nims. With that being said none of his wounds appear to be infected he does have an area where there is some skin covering what appears to be a new wound on the left dorsal surface of his great toe.  This is right at the nail bed. With that being said I do believe that debrided away some of the excess skin can be of benefit in this regard. Otherwise he has been tolerating the dressing changes without complication. 03/14/18; patient arrives today with the multiplicity of wounds that we are following. He has not been systemically unwell Original wound on the left lateral calf now only has 2 small open areas we've been using Hydrofera Blue which should continue The deep tissue injury on the left heel requires debridement today. We've been using silver alginate The left first second toe and the right first second toe are both are reminiscence what I think was tinea pedis. Apparently some of the callus Surface between the toes was removed last week when it started draining. Purulent drainage coming from the wound on the ischial tuberosity on the left. 03/21/18-He is here in follow-up evaluation for multiple wounds. 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 He tolerated debridement. The only change to last week's recommendations is to discontinue antifungal cream between toes. He will follow-up next week 03/28/18; following up for multiple wounds;Concern this week is streaking redness and swelling in the right foot. He is going to need antibiotics for this. 03/31/18; follow-up for right foot cellulitis. Streaking redness and swelling in the right foot on 03/28/18. 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 I put him on clindamycin 300 mg every 6 and brought him in for a quick check. He has an open wound between his first and second toes on the right foot as a potential  source. 04/04/18; Right foot cellulitis is resolving he is completing clindamycin. This is truly good news Left lateral calf wound which is initial wound only has one small open area inferiorly this is close to healing out. He has compression stockings. We will use Hydrofera Blue right down to the epithelialization of this Nonviable surface on the left heel which was initially pressure with a DTI. We've been using Hydrofera Blue. I'm going to switch this back to silver alginate Left first second toe/tinea pedis this looks better using silver alginate Right first second toe tinea pedis using silver alginate Large pressure ulcers on theLeft ischial tuberosity. Small wound here Looks better. I am uncertain about the surface over the large wound. Using silver alginate 04/11/18; Cellulitis in the right foot is resolved Left lateral calf wound which was his original wounds still has 2 tiny open areas remaining this is just about closed Nonviable surface on the left heel is better but still requires debridement Left first second toe/tinea pedis still open using silver alginate Right first second toe wound tinea pedis I asked him to go back to using ketoconazole and silver alginate Large pressure ulcers on the left ischial tuberosity this shear injury here is resolved. Wound is smaller. No evidence of infection using silver alginate 04/18/18; Patient arrives with an intense area of cellulitis in the right mid lower calf extending into the right heel area. Bright red and warm. Smaller area on the left anterior leg. He has a significant history of MRSA. He will definitely need antibioticsdoxycycline He now has 2 open areas on the left ischial tuberosity the original large wound and now a satellite area which I think was above his initial satellite areas. Not a wonderful surface on this satellite area surrounding erythema which looks like pressure related. His left lateral calf wound again his original  wound is just  about closed Left heel pressure injury still requiring debridement Left first second toe looks a lot better using silver alginate Right first second toe also using silver alginate and ketoconazole cream also looks better 04/20/18; the patient was worked in early today out of concerns with his cellulitis on the right leg. I had started him on doxycycline. This was 2 days ago. His wife was concerned about the swelling in the area. Also concerned about the left buttock. He has not been systemically unwell no fever chills. No nausea vomiting or diarrhea 04/25/18; the patient's left buttock wound is continued to deteriorate he is using Hydrofera Blue. He is still completing clindamycin for the cellulitis on the right leg although all of this looks better. 05/02/18 Left buttock wound still with a lot of drainage and a very tightly adherent fibrinous necrotic surface. He has a deeper area superiorly The left lateral calf wound is still closed DTI wound on the left heel necrotic surface especially the circumference using Iodoflex Areas between his left first second toe and right first second toe both look better. Dorsally and the right first second toe he had a necrotic surface although at smaller. In using silver alginate and ketoconazole. I did a culture last week which was a deep tissue culture of the reminiscence of the open wound on the right first second toe dorsally. This grew a few Acinetobacter and a few methicillin-resistant staph aureus. Nevertheless the area actually this week looked better. I didn't feel the need to specifically address this at least in terms of systemic antibiotics. 05/09/18; wounds are measuring larger more drainage per our intake. We are using Santyl covered with alginate on the large superficial buttock wounds, Iodosorb on the left heel, ketoconazole and silver alginate to the dorsal first and second toes bilaterally. 05/16/18; The area on his left buttock  better in some aspects although the area superiorly over the ischial tuberosity required an extensive debridement.using Santyl Left heel appears stable. Using Iodoflex The areas between his first and second toes are not bad however there is spreading erythema up the dorsal aspect of his left foot this looks like cellulitis again. He is insensate the erythema is really very brilliant.o Erysipelas He went to see an allergist days ago because he was itching part of this he had lab work done. This showed a white count of 15.1 with 70% neutrophils. Hemoglobin of 11.4 and a platelet count of 659,000. Last white count we had in Epic was a 2-1/2 years ago which was 25.9 but he was ill at the time. He was able to show me some lab work that was done by his primary physician the pattern is about the same. 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 But prompted me to go ahead and do x-rays of both feet and the pelvis rule out osteomyelitis. He also had a comprehensive metabolic panel this was reasonably normal his albumin was 3.7 liver function tests BUN/creatinine all normal 05/23/18; x-rays of both his feet from last week were negative for underlying pulmonary abnormality. The x-ray of his pelvis however showed mild irregularity in the left ischial which may represent some early osteomyelitis. The wound in the left ischial continues to get deeper clearly now exposed muscle. Each week necrotic surface material over this area. Whereas the rest of the wounds do not look so bad. The left ischial wound we have been using Santyl and calcium alginate To the left heel surface  necrotic debris using Iodoflex The left lateral leg is still healed Areas on the left dorsal foot and the right dorsal foot are about the same. There is some inflammation on the left which might represent contact dermatitis, fungal dermatitis I am doubtful cellulitis although this looks better  than last week 05/30/18; CT scan done at Hospital did not show any osteomyelitis or abscess. Suggested the possibility of underlying cellulitis although I don't see a lot of evidence of this at the bedside The wound itself on the left buttock/upper thigh actually looks somewhat better. No debridement Left heel also looks better no debridement continue Iodoflex Both dorsal first second toe spaces appear better using Lotrisone. Left still required debridement 06/06/18; Intake reported some purulent looking drainage from the left gluteal wound. Using Santyl and calcium alginate Left heel looks better although still a nonviable surface requiring debridement The left dorsal foot first/second webspace actually expanding and somewhat deeper. I may consider doing a shave biopsy of this area Right dorsal foot first/second webspace appears stable to improved. Using Lotrisone and silver alginate to both these areas 06/13/18 Left gluteal surface looks better. Now separated in the 2 wounds. No debridement required. Still drainage. We'll continue silver alginate Left heel continues to look better with Iodoflex continue this for at least another week Of his dorsal foot wounds the area on the left still has some depth although it looks better than last week. We've been using Lotrisone and silver alginate 06/20/18 Left gluteal continues to look better healthy tissue Left heel continues to look better healthy granulation wound is smaller. He is using Iodoflex and his long as this continues continue the Iodoflex Dorsal right foot looks better unfortunately dorsal left foot does not. There is swelling and erythema of his forefoot. He had minor trauma to this several days ago but doesn't think this was enough to have caused any tissue injury. Foot looks like cellulitis, we have had this problem before 06/27/18 on evaluation today patient appears to be doing a little worse in regard to his foot ulcer. 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 Both of which would really be his only options for treating this infection. In the past he has been given and effusion of Orbactiv. This is done very well for him in the past again it's one time dosing IV antibiotic therapy. Subsequently I do believe this is something we're gonna need to see about doing at this point in time. Currently his other wounds seem to be doing somewhat better in my pinion I'm pretty happy in that regard. 07/03/18 on evaluation today patient's wounds actually appear to be doing fairly well. He has been tolerating the dressing changes without complication. All in all he seems to be showing signs of improvement. In regard to the antibiotics he has been dealing with infectious disease since I saw him last week as far as getting this scheduled. In the end [facility]'s [facility] this coming Friday. In the meantime he has been continuing to perform the dressing changes in such as previous. There does not appear to be any evidence of infection worsengin at this time. 07/10/18; Since I last saw this man 2 weeks ago things have actually improved. IV antibiotics of resulted in less forefoot erythema although there is still some  present. He is not systemically unwell Left buttock wounds 2 now have no depth there is increased epithelialization Using silver alginate Left heel still requires debridement using Iodoflex Left dorsal foot still with a sizable wound about the size of a border but healthy granulation Right dorsal foot still with a slitlike area using silver alginate 07/18/18; the patient's cellulitis in the left foot is improved in fact I think it is on its way to resolving. Left buttock wounds 2 both look better although the larger one has hypertension  granulation we've been using silver alginate Left heel has some thick circumferential redundant skin over the wound edge which will need to be removed today we've been using Iodoflex Left dorsal foot is still a sizable wound required debridement using silver alginate The right dorsal foot is just about closed only a small open area remains here 07/25/18; left foot cellulitis is resolved Left buttock wounds 2 both look better. Hyper-granulation on the major area Left heel as some debris over the surface but otherwise looks a healthier wound. Using silver collagen Right dorsal foot is just about closed 07/31/18; arrives with our intake nurse worried about purulent drainage from the buttock. We had hyper-granulation here last week His buttock wounds 2 continue to look better Left heel some debris over the surface but measuring smaller. Right dorsal foot unfortunately has openings between the toes Left foot superficial wound looks less aggravated. 08/07/18 Buttock wounds continue to look better although some of her granulation and the larger medial wound. silver alginate Left heel continues to look a lot better.silver collagen Left foot superficial wound looks less stable. Requires debridement. He has a new wound superficial area on the foot on the lateral dorsal foot. Right foot looks better using silver alginate without Lotrisone 08/14/2018; patient was in the ER last week diagnosed with a UTI. He is now on Cefpodoxime and Macrodantin. Buttock wounds continued to be smaller. Using silver alginate Left heel continues to look better using silver collagen Left foot superficial wound looks as though it is improving Right dorsal foot area is just about healed. 08/21/2018; patient is completed his antibiotics for his UTI. He has 2 open areas on the buttocks. There is still not closed although the surface looks satisfactory. Using silver alginate Left heel continues to improve using silver  collagen The bilateral dorsal foot areas which are at the base of his first and second toes/possible tinea pedis are actually stable on the left but worse on the right. The area on the left required debridement of necrotic surface. After debridement I obtained a specimen for PCR culture. The right dorsal foot which is been just about healed last week is now reopened 08/28/2018; culture done on the left dorsal foot showed coag negative staph both staph epidermidis and Lugdunensis. I think this is worthwhile initiating systemic treatment. I will use doxycycline given his long list of allergies. The area on the left heel slightly improved but still requiring debridement. The large wound on the buttock is just about closed whereas the smaller one is larger. Using silver alginate in this area 09/04/2018; patient is completing his doxycycline for the left foot although this continues to be a very difficult wound area with very adherent necrotic debris. We are using silver alginate to all his wounds right foot left foot and the small wounds on his buttock, silver collagen on the left heel. 09/11/2018; once again this patient has intense erythema and swelling of the left forefoot. Lesser degrees of erythema in  the right foot. He has a long list of allergies and intolerances. I will reinstitute doxycycline. 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 Using silver alginate Left heel also looks better using silver collagen Unfortunately both the areas on his feet look worse. The area on the left first second webspace is now gone through to the plantar part of his foot. The area on the left foot anteriorly is irritated with erythema and swelling in the forefoot. 09/25/2018 His wound on the left plantar heel looks better. Using silver collagen The area on the left buttock 2 small remnant areas. One is closed one is still open. Using silver alginate The areas  between both his first and second toes look worse. This in spite of long-standing antifungal therapy with ketoconazole and silver alginate which should have antifungal activity He has small areas around his original wound on the left calf one is on the bottom of the original scar tissue and one superiorly both of these are small and superficial but again given wound history in this site this is worrisome 10/02/2018 Left plantar heel continues to gradually contract using silver collagen Left buttock wound is unchanged using silver alginate The areas on his dorsal feet between his first and second toes bilaterally look about the same. I prescribed clindamycin ointment to see if we can address chronic staph colonization and also the underlying possibility of erythrasma The left lateral lower extremity wound is actually on the lateral part of his ankle. Small open area here. We have been using silver alginate 10/09/2018; Left plantar heel continues to look healthy and contract. No debridement is required Left buttock slightly smaller with a tape injury wound just below which was new this week Dorsal feet somewhat improved I have been using clindamycin Left lateral looks lower extremity the actual open area looks worse although a lot of this is epithelialized. I am going to change to silver collagen today He has a lot more swelling in the right leg although this is not pitting not red and not particularly warm there is a lot of spasm in the right leg usually indicative of people with paralysis of some underlying discomfort. We have reviewed his vascular status from 2017 he had a left greater saphenous vein ablation. I wonder about referring him back to vascular surgery if the area on the left leg continues to deteriorate. 10/16/2018 in today for follow-up and management of multiple lower extremity ulcers. His left Buttock wound is much lower smaller and almost closed completely. The wound to the  left ankle has began to reopen with Epithelialization and some adherent slough. He has multiple new areas to the left foot and leg. The left dorsal foot without much improvement. Wound present between left great webspace and 2nd toe. Erythema and edema present right leg. Right LE ultrasound obtained on 10/10/18 was negative for DVT. 10/23/2018; Left buttock is closed over. Still dry macerated skin but there is no open wound. I suspect this is chronic pressure/moisture Left lateral calf is quite a bit worse than when I saw this last. There is clearly drainage here he has macerated skin into the left plantar heel. We will change the primary dressing to alginate Left dorsal foot has some improvement in overall wound area. Still using clindamycin and silver alginate Right dorsal foot about the same as the left using clindamycin and silver alginate The erythema in the right leg has resolved. He is DVT  rule out was negative Left heel pressure area required debridement although the wound is smaller and the surface is health 10/26/2018 The patient came back in for his nurse check today predominantly because of the drainage coming out of the left lateral leg with a recent reopening of his original wound on the left lateral calf. He comes in today with a large amount of surrounding erythema around the wound extending from the calf into the ankle and even in the area on the dorsal foot. He is not systemically unwell. He is not febrile. Nevertheless this looks like cellulitis. We have been using silver alginate to the area. I changed him to a regular visit and I am going to prescribe him doxycycline. The rationale here is a long list of medication intolerances and a history of MRSA. I did not see anything that I thought would provide a valuable culture 10/30/2018 Follow-up from his appointment 4 days ago with really an extensive area of cellulitis in the left calf left lateral ankle and left dorsal foot. I  put him on doxycycline. He has a long list of medication allergies which are true allergy reactions. 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 In any case he is a lot better today. The erythema especially in the anterior and lateral left calf is better. He still has left ankle erythema. He also is complaining about increasing edema in the right leg we have only been using Kerlix Coban and he has been doing the wraps at home. Finally he has a spotty rash on the medial part of his upper left calf which looks like folliculitis or perhaps wrap occlusion type injury. Small superficial macules not pustules 11/06/18 patient arrives today with again a considerable degree of erythema around the wound on the left lateral calf extending into the dorsal ankle and dorsal foot. This is a lot worse than when I saw this last week. He is on doxycycline really with not a lot of improvement. He has not been systemically unwell Wounds on the; left heel actually looks improved. Original area on the left foot and proximity to the first and second toes looks about the same. He has superficial areas on the dorsal foot, anterior calf and then the reopening of his original wound on the left lateral calf which looks about the same The only area he has on the right is the dorsal webspace first and second which is smaller. He has a large area of dry erythematous skin on the left buttock small open area here. 11/13/2018; the patient arrives in much better condition. The erythema around the wound on the left lateral calf is a lot better. Not sure whether this was the clindamycin or the TCA and ketoconazole or just in the improvement in edema control [stasis dermatitis]. In any case this is a lot better. The area on the left heel is very small and just about resolved using silver collagen we have been using silver alginate to the areas on his dorsal  feet 11/20/2018; his wounds include the left lateral calf, left heel, dorsal aspects of both feet just proximal to the first second webspace. He is stable to slightly improved. I did not think any changes to his dressings were going to be necessary 11/27/2018 he has a reopening on the left buttock which is surrounded by what looks like tinea or perhaps some other form of dermatitis. The area on the left dorsal foot has  some erythema around it I have marked this area but I am not sure whether this is cellulitis or not. Left heel is not closed. Left calf the reopening is really slightly longer and probably worse 1/13; in general things look better and smaller except for the left dorsal foot. Area on the left heel is just about closed, left buttock looks better only a small wound remains in the skin looks better [using Lotrisone] 1/20; the area on the left heel only has a few remaining open areas here. Left lateral calf about the same in terms of size, left dorsal foot slightly larger right lateral foot still not closed. The area on the left buttock has no open wound and the surrounding skin looks a lot better 1/27; the area on the left heel is closed. Left lateral calf better but still requiring extensive debridements. The area on his left buttock is closed. He still has the open areas on the left dorsal foot which is slightly smaller in the right foot which is slightly expanded. We have been using Iodoflex on these areas as well 2/3; left heel is closed. Left lateral calf still requiring debridement using Iodoflex there is no open area on his left buttock however he has dry scaly skin over a large area of this. Not really responding well to the Lotrisone. Finally the areas on his dorsal feet at the level of the first second webspace are slightly smaller on the right and about the same on the left. Both of these vigorously debrided with Anasept and gauze 2/10; left heel remains closed he has dry  erythematous skin over the left buttock but there is no open wound here. Left lateral leg has come in and with. Still requiring debridement we have been using Iodoflex here. Finally the area on the left dorsal foot and right dorsal foot are really about the same extremely dry callused fissured areas. He does not yet have a dermatology appointment 2/17; left heel remains closed. He has a new open area on the left buttock. The area on the left lateral calf is bigger longer and still covered in necrotic debris. No major change in his foot areas bilaterally. I am awaiting for a dermatologist to look on this. We have been using ketoconazole I do not know that this is been doing any good at all. 2/24; left heel remains closed. The left buttock wound that was new reopening last week looks better. The left lateral calf appears better also although still requires debridement. The major area on his foot is the left first second also requiring debridement. We have been putting Prisma on all wounds. I do not believe that the ketoconazole has done too much good for his feet. He will use Lotrisone I am going to give him a 2-week course of terbinafine. We still do not have a dermatology appointment 3/2 left heel remains closed however there is skin over bone in this area I pointed this out to him today. The left buttock wound is epithelialized but still does not look completely stable. The area on the left leg required debridement were using silver collagen here. With regards to his feet we changed to Lotrisone last week and silver alginate. 3/9; left heel remains closed. Left buttock remains closed. The area on the right foot is essentially closed. The left foot remains unchanged. Slightly smaller on the left lateral calf. Using silver collagen to both of these areas 3/16-Left heel remains closed. Area on right foot is closed. Left  lateral calf above the lateral malleolus open wound requiring debridement with  easy bleeding. Left dorsal wound proximal to first toe also debrided. Left ischial area open new. Patient has been using Prisma with wrapping every 3 days. Dermatology appointment is apparently tomorrow.Patient has completed his terbinafine 2-week course with some apparent improvement according to him, there is still flaking and dry skin in his foot on the left 3/23; area on the right foot is reopened. The area on the left anterior foot is about the same still a very necrotic adherent surface. He still has the area on the left leg and reopening is on the left buttock. He apparently saw dermatology although I do not have a note. According to the patient who is usually fairly well informed they did not have any good ideas. Put him on oral terbinafine which she is been on before. 3/30; using silver collagen to all wounds. Apparently his dermatologist put him on doxycycline and rifampin presumably some culture grew staph. I do not have this result. He remains on terbinafine although I have used terbinafine on him before 4/6; patient has had a fairly substantial reopening on the right foot between the first and second toes. He is finished his terbinafine and I believe is on doxycycline and rifampin still as prescribed by dermatology. We have been using silver collagen to all his wounds although the patient reports that he thinks silver alginate does better on the wounds on his buttock. 4/13; the area on his left lateral calf about the same size but it did not require debridement. Left dorsal foot just proximal to the webspace between the first and second toes is about the same. Still nonviable surface. I note some superficial bronze discoloration of the dorsal part of his foot Right dorsal foot just proximal to the first and second toes also looks about the same. I still think there may be the same discoloration I noted above on the left Left buttock wound looks about the same 4/20; left lateral calf  appears to be gradually contracting using silver collagen. 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 The left dorsal foot wound is debrided of tightly adherent necrotic debris and really cleans up quite nicely. The right area is worse with expansion. I did not debride this it is now over the base of the second toe The area on his left buttock is smaller no debridement is required using silver collagen 5/4; left calf continues to make good progress. He arrives with erythema around the wounds on his dorsal foot which even extends to the plantar aspect. Very concerning for coexistent infection. He is finished the erythromycin I gave him for possible erythrasma this does not seem to have helped. The area on the left foot is about the same base of the dorsal toes Is area on the buttock looks improved on the left 5/11; left calf and left buttock continued to make good progress. Left foot is about the same to slightly improved. Major problem is on the right foot. He has not had an x-ray. Deep tissue culture I did last week showed both Enterobacter and E. coli. I did not change the doxycycline I put him on empirically although neither 1 of these were plated to doxycycline. He arrives today with the erythema looking worse on both the dorsal and plantar foot. Macerated skin on the bottom of the foot. he has not been systemically unwell 5/18-Patient returns at 1 week, left calf wound appears  to be making some progress, left buttock wound appears slightly worse than last time, left foot wound looks slightly better, right foot redness is marginally better. X-ray of both feet show no air or evidence of osteomyelitis. Patient is finished his Omnicef and terbinafine. He continues to have macerated skin on the bottom of the left foot as well as right 5/26; left calf wound is better, left buttock wound appears to have multiple small superficial open areas  with surrounding macerated skin. X-rays that I did last time showed no evidence of osteomyelitis in either foot. He is finished cefdinir and doxycycline. I do not think that he was on terbinafine. He continues to have a large superficial open area on the right foot anterior dorsal and slightly between the first and second toes. I did send him to dermatology 2 months ago or so wondering about whether they would do a fungal scraping. I do not believe they did but did do a culture. We have been using silver alginate to the toe areas, he has been using antifungals at home topically either ketoconazole or Lotrisone. We are using silver collagen on the left foot, silver alginate on the right, silver collagen on the left lateral leg and silver alginate on the left buttock 6/1; left buttock area is healed. We have the left dorsal foot, left lateral leg and right dorsal foot. We are using silver alginate to the areas on both feet and silver collagen to the area on his left lateral calf 6/8; the left buttock apparently reopened late last week. He is not really sure how this happened. He is tolerating the terbinafine. Using silver alginate to all wounds 6/15; left buttock wound is larger than last week but still superficial. Came in the clinic today with a report of purulence from the left lateral leg I did not identify any infection Both areas on his dorsal feet appear to be better. He is tolerating the terbinafine. Using silver alginate to all wounds 6/22; left buttock is about the same this week, left calf quite a bit better. His left foot is about the same however he comes in with erythema and warmth in the right forefoot once again. Culture that I gave him in the beginning of May showed Enterobacter and E. coli. I gave him doxycycline and things seem to improve although neither 1 of these organisms was specifically plated. 6/29; left buttock is larger and dry this week. Left lateral calf looks to me to  be improved. Left dorsal foot also somewhat improved right foot completely unchanged. The erythema on the right foot is still present. He is completing the Ceftin dinner that I gave him empirically [see discussion above.) 7/6 - All wounds look to be stable and perhaps improved, the left buttock wound is slightly smaller, per patient bleeds easily, completed ceftin, the right foot redness is less, he is on terbinafine 7/13; left buttock wound about the same perhaps slightly narrower. Area on the left lateral leg continues to narrow. Left dorsal foot slightly smaller right foot about the same. We are using silver alginate on the right foot and Hydrofera Blue to the areas on the left. Unna boot on the left 2 layer compression on the right 7/20; left buttock wound absolutely the same. Area on lateral leg continues to get better. Left dorsal foot require debridement as did the right no major change in the 7/27; left buttock wound the same size necrotic debris over the surface. The area on the lateral leg is  closed once again. His left foot looks better right foot about the same although there is some involvement now of the posterior first second toe area. He is still on terbinafine which I have given him for a month, not certain a centimeter major change 06/25/19-All wounds appear to be slightly improved according to report, left buttock wound looks clean, both foot wounds have minimal to no debris the right dorsal foot has minimal slough. We are using Hydrofera Blue to the left and silver alginate to the right foot and ischial wound. 8/10-Wounds all appear to be around the same, the right forefoot distal part has some redness which was not there before, however the wound looks clean and small. Ischial wound looks about the same with no changes 8/17; his wound on the left lateral calf which was his original chronic venous insufficiency wound remains closed. Since I last saw him the areas on the left  dorsal foot right dorsal foot generally appear better but require debridement. The area on his left initial tuberosity appears somewhat larger to me perhaps hyper granulated and bleeds very easily. We have been using Hydrofera Blue to the left dorsal foot and silver alginate to everything else 8/24; left lateral calf remains closed. The areas on his dorsal feet on the webspace of the first and second toes bilaterally both look better. The area on the left buttock which is the pressure ulcer stage II slightly smaller. I change the dressing to Hydrofera Blue to all areas 8/31; left lateral calf remains closed. The area on his dorsal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 Thursday. This was supposed to be a nurse visit however once again he hasn't tense erythema from the medial part of his wound extending over the lower leg. Also erythema in his foot this is roughly in the same distribution as last time. He has baseline chronic venous inflammation however this is a lot worse than the baseline I have learned to accept the on him is baseline inflammation 02/24/17- patient is  here for follow-up evaluation. He is tolerating compression therapy. His voicing no complaints or concerns he is here anticipating an Apligraf 03/03/17; he arrives today with an adherent necrotic surface. I don't think this is surface is going to be amenable for Apligraf's. The erythema around his wound and on the left dorsal foot has resolved he is off antibiotics 03/10/17; better-looking surface today. I don't think he can tolerate Apligraf's. He tells me he had a wound VAC after a skin graft years ago to this area and they had difficulty with a seal. The erythema continues to be stable around this some degree of chronic venous inflammation but he also has recurrent cellulitis. We have been using Iodoflex 03/17/17; continued improvement in the surface and may be small changes in dimensions. Using Iodoflex which seems the only thing that will control his surface 03/24/17- He is here for follow up evaluation of his LLE lateral ulceration and ulcer to right dorsal foot/toe space. He is voicing no complaints or concerns, He is tolerating compression wrap. 03/31/17 arrives today with a much healthier looking wound on the left lower extremity. We have been using Iodoflex for a prolonged period of time which has for the first time prepared and adequate looking wound bed although we have not had much in the way of wound dimension improvement. He also has a small wound between the first and second toe on the right 04/07/17; arrives today with a healthy-looking wound bed and at least the top 50% of this wound appears to be now her. No debridement was required I have changed him to [facility] last week after prolonged Iodoflex. He did not do well with Apligraf's. We've had a re-opening between the first and second toe on the right 04/14/17; arrives today with a healthier looking wound bed contractions and the top 50% of this wound and some on the lesser 50%. Wound bed appears healthy. The area between the  first and second toe on the right still remains problematic 04/21/17; continued very gradual improvement. Using [facility] 04/28/17; continued very gradual improvement in the left lateral leg venous insufficiency wound. His periwound erythema is very mild. We have been using Hydrofera Blue. Wound is making progress especially in the superior 50% 05/05/17; he continues to have very gradual improvement in the left lateral venous insufficiency wound. Both in terms with an length rings are improving. I debrided this every 2 weeks with #5 curet and we have been using Hydrofera Blue and again making good progress With regards to the wounds between his right first and second toe which I thought might of been tinea pedis he is not making as much progress very dry scaly skin over the area. Also the area at the base of the left first and second toe in a similar condition 05/12/17; continued gradual improvement in the refractory left lateral venous insufficiency wound on the left. Dimension smaller. Surface still requiring debridement using Hydrofera Blue 05/19/17; continued gradual improvement in the  refractory left lateral venous ulceration. Careful inspection of the wound bed underlying rumination suggested some degree of epithelialization over the surface no debridement indicated. Continue Hydrofera Blue difficult areas between his toes first and third on the left than first and second on the right. I'm going to change to silver alginate from silver collagen. Continue ketoconazole as I suspect underlying tinea pedis 05/26/17; left lateral leg venous insufficiency wound. We've been using Hydrofera Blue. I believe that there is expanding epithelialization over the surface of the wound albeit not coming from the wound circumference. This is a bit of an odd situation in which the epithelialization seems to be coming from the surface of the wound rather than in the exact circumference. There is still small  open areas mostly along the lateral margin of the wound. ooHe has unchanged areas between the left first and second and the right first second toes which I been treating for tenia pedis 06/02/17; left lateral leg venous insufficiency wound. We have been using Hydrofera Blue. Somewhat smaller from the wound circumference. The surface of the wound remains a bit on it almost epithelialized sedation in appearance. I use an open curette today debridement in the surface of all of this especially the edges ooSmall open wounds remaining on the dorsal right first and second toe interspace and the plantar left first second toe and her face on the left 06/09/17; wound on the left lateral leg continues to be smaller but very gradual and very dry surface using Hydrofera Blue 06/16/17 requires weekly debridements now on the left lateral leg although this continues to contract. I changed to silver collagen last week because of dryness of the wound bed. Using Iodoflex to the areas on his first and second toes/web space bilaterally 06/24/17; patient with history of paraplegia also chronic venous insufficiency with lymphedema. Has a very difficult wound on the left lateral leg. This has been gradually reducing in terms of with but comes in with a very dry adherent surface. High switch to silver collagen a week or so ago with hydrogel to keep the area moist. This is been refractory to multiple dressing attempts. He also has areas in his first and second toes bilaterally in the anterior and posterior web space. I had been using Iodoflex here after a prolonged course of silver alginate with ketoconazole was ineffective [question tinea pedis] 07/14/17; patient arrives today with a very difficult adherent material over his left lateral lower leg wound. He also has surrounding erythema and poorly controlled edema. He was switched his Santyl last visit which the nurses are applying once during his doctor visit and once on a  nurse visit. He was also reduced to 2 layer compression I'm not exactly sure of the issue here. 07/21/17; better surface today after 1 week of Iodoflex. Significant cellulitis that we treated last week also better. [Doxycycline] 07/28/17 better surface today with now 2 weeks of Iodoflex. Significant cellulitis treated with doxycycline. He has now completed the doxycycline and he is back to his usual degree of chronic venous inflammation/stasis dermatitis. He reminds me he has had ablations surgery here 08/04/17; continued improvement with Iodoflex to the left lateral leg wound in terms of the surface of the wound although the dimensions are better. 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 Problematic areas on the plantar aspect of the first second toe web space on the left and the dorsal aspect of the first second toe web space  on the right. At one point I felt these were probably related to chronic fungal infections in treated him aggressively for this although we have not made any improvement here. 08/11/17; left lateral leg. Surface continues to improve with the Iodoflex although we are not seeing much improvement in overall wound dimensions. Areas on his plantar left foot and right foot show no improvement. In fact the right foot looks somewhat worse 08/18/17; left lateral leg. We changed to [facility] Blue last week after a prolonged course of Iodoflex which helps get the surface better. It appears that the wound with is improved. Continue with difficult areas on the left dorsal first second and plantar first second on the right 09/01/17; patient arrives in clinic today having had a temperature of 103 yesterday. He was seen in the ER and [facility]. The patient was concerned he could have cellulitis again in the right leg however they diagnosed him with a UTI and he is now on Keflex. He has a history of cellulitis which is been  recurrent and difficult but this is been in the left leg, in the past 5 use doxycycline. He does in and out catheterizations at home which are risk factors for UTI 09/08/17; patient will be completing his Keflex this weekend. The erythema on the left leg is considerably better. He has a new wound today on the medial part of the right leg small superficial almost looks like a skin tear. He has worsening of the area on the right dorsal first and second toe. His major area on the left lateral leg is better. Using Hydrofera Blue on all areas 09/15/17; gradual reduction in width on the long wound in the left lateral leg. No debridement required. He also has wounds on the plantar aspect of his left first second toe web space and on the dorsal aspect of the right first second toe web space. 09/22/17; there continues to be very gradual improvements in the dimensions of the left lateral leg wound. He hasn't round erythematous spot with might be pressure on his wheelchair. There is no evidence obviously of infection no purulence no warmth ooHe has a dry scaled area on the plantar aspect of the left first second toe ooImproved area on the dorsal right first second toe. 09/29/17; left lateral leg wound continues to improve in dimensions mostly with an is still a fairly long but increasingly narrow wound. ooHe has a dry scaled area on the plantar aspect of his left first second toe web space ooIncreasingly concerning area on the dorsal right first second toe. In fact I am concerned today about possible cellulitis around this wound. The areas extending up his second toe and although there is deformities here almost appears to abut on the nailbed. 10/06/17; left lateral leg wound continues to make very gradual progress. Tissue culture I did from the right first second toe dorsal foot last time grew MRSA and enterococcus which was vancomycin sensitive. This was not sensitive to clindamycin or doxycycline.  He is allergic to Zyvox and sulfa we have therefore arrange for him to have dalvance infusion tomorrow. He is had this in the past and tolerated it well 10/20/17; left lateral leg wound continues to make decent progress. This is certainly reduced in terms of with there is advancing epithelialization.ooThe cellulitis in the right foot looks better although he still has a deep wound in the dorsal aspect of the first second toe web space. Plantar left first toe web space on the left I think  is making some progress 10/27/17; left lateral leg wound continues to make decent progress. Advancing epithelialization.using Hydrofera Blue ooThe right first second toe web space wound is better-looking using silver alginate ooImprovement in the left plantar first second toe web space. Again using silver alginate 11/03/17 left lateral leg wound continues to make decent progress albeit slowly. Using Hydrofera Blue ooThe right per second toe web space continues to be a very problematic looking punched out wound. I obtained a piece of tissue for deep culture I did extensively treated this for fungus. It is difficult to imagine that this is a pressure area as the patient states other than going outside he doesn't really wear shoes at home ooThe left plantar first second toe web space looked fairly senescent. Necrotic edges. This required debridement oochange to Hydrofera Blue to all wound areas 11/10/17; left lateral leg wound continues to contract. Using Hydrofera Blue ooOn the right dorsal first second toe web space dorsally. Culture I did of this area last week grew MRSA there is not an easy oral option in this patient was multiple antibiotic allergies or intolerances. This was only a rare culture isolate I'm therefore going to use Bactroban under silver alginate ooOn the left plantar first second toe web space. Debridement is required here. This is also unchanged 11/17/17; left lateral leg wound continues  to contract using Hydrofera Blue this is no longer the major issue. ooThe major concern here is the right first second toe web space. He now has an open area going from dorsally to the plantar aspect. There is now wound on the inner lateral part of the first toe. Not a very viable surface on this. There is erythema spreading medially into the forefoot. ooNo major change in the left first second toe plantar wound 11/24/17; left lateral leg wound continues to contract using Hydrofera Blue. Nice improvement today ooThe right first second toe web space all of this looks a lot less angry than last week. I have given him clindamycin and topical Bactroban for MRSA and terbinafine for the possibility of underlining tinea pedis that I could not control with ketoconazole. Looks somewhat better ooThe area on the plantar left first second toe web space is weeping with dried debris around the wound 12/01/17; left lateral leg wound continues to contract he Hydrofera Blue. It is becoming thinner in terms of with nevertheless it is making good improvement. ooThe right first second toe web space looks less angry but still a large necrotic-looking wounds starting on the plantar aspect of the right foot extending between the toes and now extensively on the base of the right second toe. I gave him clindamycin and topical Bactroban for MRSA anterior benefiting for the possibility of underlying tinea pedis. Not looking better today ooThe area on the left first/second toe looks better. Debrided of necrotic debris 12/05/17* the patient was worked in urgently today because over the weekend he found blood on his incontinence bad when he woke up. He was found to have an ulcer by his wife who does most of his wound care. He came in today for [place] to look at this. He has not had a history of wounds in his buttocks in spite of his paraplegia. 12/08/17; seen in follow-up today at his usual appointment. He was seen earlier this  week and found to have a new wound on his buttock. We also follow him for wounds on the left lateral leg, left first second toe web space and right first second toe  web space 12/15/17; we have been using Hydrofera Blue to the left lateral leg which has improved. The right first second toe web space has also improved. Left first second toe web space plantar aspect looks stable. The left buttock has worsened using Santyl. Apparently the buttock has drainage 12/22/17; we have been using Hydrofera Blue to the left lateral leg which continues to improve now 2 small wounds separated by normal skin. He tells [place] he had a fever up to 100 yesterday he is prone to UTIs but has not noted anything different. He does in and out catheterizations. The area between the first and second toes today does not look good necrotic surface covered with what looks to be purulent drainage and erythema extending into the third toe. I had gotten this to something that I thought look better last time however it is not look good today. He also has a necrotic surface over the buttock wound which is expanded. I thought there might be infection under here so I removed a lot of the surface with a #5 curet though nothing look like it really needed culturing. He is been using Santyl to this area 12/27/17; his original wound on the left lateral leg continues to improve using Hydrofera Blue. I gave him samples of Baxdella although he was unable to take them out of fear for an allergic reaction ["lump in his throat"].the culture I did of the purulent drainage from his second toe last week showed both enterococcus and a set Enterobacter I was also concerned about the erythema on the bottom of his foot although paradoxically although this looks somewhat better today. 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 We've been using silver alginate here.  They came up today that he sleeps in a recliner, I'm not sure why but I asked him to stop this 01/03/18; his original wound we've been using Hydrofera Blue is now separated into 2 areas. ooUlcer on his left buttock is better he is off the recliner and sleeping in bed ooFinally both wound areas between his first and second toes also looks some better 01/10/18; his original wound on the left lateral leg is now separated into 2 wounds we've been using Hydrofera Blue ooUlcer on his left buttock has some drainage. There is a small probing site going into muscle layer superiorly.using silver alginate -He arrives today with a deep tissue injury on the left heel ooThe wound on the dorsal aspect of his first second toe on the left looks a lot betterusing silver alginate ketoconazole ooThe area on the first second toe web space on the right also looks a lot bette 01/17/18; his original wound on the left lateral leg continues to progress using Hydrofera Blue ooUlcer on his left buttock also is smaller surface healthier except for a small probing site going into the muscle layer superiorly. 2.4 cm of tunneling in this area ooDTI on his left heel we have only been offloading. Looks better than last week no threatened open no evidence of infection oothe wound on the dorsal aspect of the first second toe on the left continues to look like it's regressing we have only been using silver alginate and terbinafine orally ooThe area in the first second toe web space on the right also looks to be a lot better using silver alginate and terbinafine I think this was prompted by tinea pedis 01/31/18; the patient was hospitalized in  [place] last week apparently for a complicated UTI. He was discharged on cefepime he does in and out catheterizations. In the hospital he was discovered M I don't mild elevation of AST and ALTs and the terbinafine was stopped.predictably the pressure ulcer on his buttock looks  betterusing silver alginate. The area on the left lateral leg also is better using Hydrofera Blue. The area between the first and second toes on the left better. First and second toes on the right still substantial but better. Finally the DTI on the left heel has held together and looks like it's resolving 02/07/18-he is here in follow-up evaluation for multiple ulcerations. He has new injury to the lateral aspect of the last issue a pressure ulcer, he states this is from adhesive removal trauma. He states he has tried multiple adhesive products with no success. All other ulcers appear stable. The left heel DTI is resolving. We will continue with same treatment plan and follow-up next week. 02/14/18; follow-up for multiple areas. ooHe has a new area last week on the lateral aspect of his pressure ulcer more over the posterior trochanter. The original pressure ulcer looks quite stable has healthy granulation. 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 Almost fully epithelialized on the original superior area using Hydrofera Blue ooDTI on the left heel has peeled off this week to reveal a small superficial wound under denuded skin and subcutaneous tissue ooBoth areas between the first and second toes look better including nothing open on the left 02/21/18; ooThe patient's wounds on his left ischial tuberosity and posterior left greater trochanter actually looked better. He has a large area of irritation around the area which I think is contact dermatitis. I am doubtful that this is fungal ooHis original wound on the left lateral calf continues to improve we have been using Hydrofera Blue ooThere is no open area in the left first second toe web space although there is a lot of thick callus ooThe DTI on the left heel required debridement today of necrotic surface eschar and subcutaneous  tissue using silver alginate ooFinally the area on the right first second toe webspace continues to contract using silver alginate and ketoconazole 02/28/18 ooLeft ischial tuberosity wounds look better using silver alginate. ooOriginal wound on the left calf only has one small open area left using Hydrofera Blue ooDTI on the left heel required debridement mostly removing skin from around this wound surface. Using silver alginate ooThe areas on the right first/second toe web space using silver alginate and ketoconazole 03/08/18 on evaluation today patient appears to be doing decently well as best I can tell in regard to his wounds. This is the first time that I have seen him as he generally is followed by Dr. Dellia Nims. With that being said none of his wounds appear to be infected he does have an area where there is some skin covering what appears to be a new wound on the left dorsal surface of his great toe. This is right at the nail bed. With that being said I do believe that debrided away some of the excess skin can be of benefit in this regard. Otherwise he has been tolerating the dressing changes without complication. 03/14/18; patient arrives today with the multiplicity of wounds that we are following. He has not been systemically unwell ooOriginal wound on the left lateral calf now only has 2 small open areas we've been using  Hydrofera Blue which should continue ooThe deep tissue injury on the left heel requires debridement today. We've been using silver alginate ooThe left first second toe and the right first second toe are both are reminiscence what I think was tinea pedis. Apparently some of the callus Surface between the toes was removed last week when it started draining. ooPurulent drainage coming from the wound on the ischial tuberosity on the left. 03/21/18-He is here in follow-up evaluation for multiple wounds. 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 He tolerated debridement. The only change to last week's recommendations is to discontinue antifungal cream between toes. He will follow-up next week 03/28/18; following up for multiple wounds;Concern this week is streaking redness and swelling in the right foot. He is going to need antibiotics for this. 03/31/18; follow-up for right foot cellulitis. Streaking redness and swelling in the right foot on 03/28/18. 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 I put him on clindamycin 300 mg every 6 and brought him in for a quick check. He has an open wound between his first and second toes on the right foot as a potential source. 04/04/18; ooRight foot cellulitis is resolving he is completing clindamycin. This is truly good news ooLeft lateral calf wound which is initial wound only has one small open area inferiorly this is close to healing out. He has compression stockings. We will use Hydrofera Blue right down to the epithelialization of this ooNonviable surface on the left heel which was initially pressure with a DTI. We've been using Hydrofera Blue. I'm going to switch this back to silver alginate ooLeft first second toe/tinea pedis this looks better using silver alginate ooRight first second toe tinea pedis using silver alginate ooLarge pressure ulcers on theLeft ischial tuberosity. Small wound here Looks better. I am uncertain about the surface over the large wound. Using silver alginate 04/11/18; ooCellulitis in the right foot is resolved ooLeft lateral calf wound which was his original wounds still has 2 tiny open areas remaining this is just about closed ooNonviable surface on the left heel is better but still requires debridement ooLeft first second toe/tinea pedis still open using silver alginate ooRight first second toe wound tinea pedis I asked him to go back to using ketoconazole and silver alginate ooLarge  pressure ulcers on the left ischial tuberosity this shear injury here is resolved. Wound is smaller. No evidence of infection using silver alginate 04/18/18; ooPatient arrives with an intense area of cellulitis in the right mid lower calf extending into the right heel area. Bright red and warm. Smaller area on the left anterior leg. He has a significant history of MRSA. He will definitely need antibioticsoodoxycycline ooHe now has 2 open areas on the left ischial tuberosity the original large wound and now a satellite area which I think was above his initial satellite areas. Not a wonderful surface on this satellite area surrounding erythema which looks like pressure related. ooHis left lateral calf wound again his original wound is just about closed ooLeft heel pressure injury still requiring debridement ooLeft first second toe looks a lot better using silver alginate ooRight first second toe also using silver alginate and ketoconazole cream also looks better 04/20/18; the patient was worked in early today out of concerns with his cellulitis on the right leg. I had started him on doxycycline. This was 2 days ago. His wife was concerned about the swelling in the area. Also concerned  about the left buttock. He has not been systemically unwell no fever chills. No nausea vomiting or diarrhea 04/25/18; the patient's left buttock wound is continued to deteriorate he is using Hydrofera Blue. He is still completing clindamycin for the cellulitis on the right leg although all of this looks better. 05/02/18 ooLeft buttock wound still with a lot of drainage and a very tightly adherent fibrinous necrotic surface. He has a deeper area superiorly ooThe left lateral calf wound is still closed ooDTI wound on the left heel necrotic surface especially the circumference using Iodoflex ooAreas between his left first second toe and right first second toe both look better. Dorsally and the right  first second toe he had a necrotic surface although at smaller. In using silver alginate and ketoconazole. I did a culture last week which was a deep tissue culture of the reminiscence of the open wound on the right first second toe dorsally. This grew a few Acinetobacter and a few methicillin-resistant staph aureus. Nevertheless the area actually this week looked better. I didn't feel the need to specifically address this at least in terms of systemic antibiotics. 05/09/18; wounds are measuring larger more drainage per our intake. We are using Santyl covered with alginate on the large superficial buttock wounds, Iodosorb on the left heel, ketoconazole and silver alginate to the dorsal first and second toes bilaterally. 05/16/18; ooThe area on his left buttock better in some aspects although the area superiorly over the ischial tuberosity required an extensive debridement.using Santyl ooLeft heel appears stable. Using Iodoflex ooThe areas between his first and second toes are not bad however there is spreading erythema up the dorsal aspect of his left foot this looks like cellulitis again. He is insensate the erythema is really very brilliant.o Erysipelas He went to see an allergist days ago because he was itching part of this he had lab work done. This showed a white count of 15.1 with 70% neutrophils. Hemoglobin of 11.4 and a platelet count of 659,000. Last white count we had in [facility] was a 2-1/2 years ago which was 25.9 but he was ill at the time. He was able to show me some lab work that was done by his primary physician the pattern is about the same. 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 But prompted me to go ahead and do x-rays of both feet and the pelvis rule out osteomyelitis. He also had a comprehensive metabolic panel this was reasonably normal his albumin was 3.7 liver function tests BUN/creatinine all normal 05/23/18; x-rays of both  his feet from last week were negative for underlying pulmonary abnormality. The x-ray of his pelvis however showed mild irregularity in the left ischial which may represent some early osteomyelitis. The wound in the left ischial continues to get deeper clearly now exposed muscle. Each week necrotic surface material over this area. Whereas the rest of the wounds do not look so bad. ooThe left ischial wound we have been using Santyl and calcium alginate ooTo the left heel surface necrotic debris using Iodoflex ooThe left lateral leg is still healed ooAreas on the left dorsal foot and the right dorsal foot are about the same. There is some inflammation on the left which might represent contact dermatitis, fungal dermatitis I am doubtful cellulitis although this looks better than last week 05/30/18; CT scan done at Hospital did not show any osteomyelitis or abscess. Suggested the possibility of underlying cellulitis although I don't see  a lot of evidence of this at the bedside ooThe wound itself on the left buttock/upper thigh actually looks somewhat better. No debridement ooLeft heel also looks better no debridement continue Iodoflex ooBoth dorsal first second toe spaces appear better using Lotrisone. Left still required debridement 06/06/18; ooIntake reported some purulent looking drainage from the left gluteal wound. Using Santyl and calcium alginate ooLeft heel looks better although still a nonviable surface requiring debridement ooThe left dorsal foot first/second webspace actually expanding and somewhat deeper. I may consider doing a shave biopsy of this area ooRight dorsal foot first/second webspace appears stable to improved. Using Lotrisone and silver alginate to both these areas 06/13/18 ooLeft gluteal surface looks better. Now separated in the 2 wounds. No debridement required. Still drainage. We'll continue silver alginate ooLeft heel continues to look better with Iodoflex  continue this for at least another week ooOf his dorsal foot wounds the area on the left still has some depth although it looks better than last week. We've been using Lotrisone and silver alginate 06/20/18 ooLeft gluteal continues to look better healthy tissue ooLeft heel continues to look better healthy granulation wound is smaller. He is using Iodoflex and his long as this continues continue the Iodoflex ooDorsal right foot looks better unfortunately dorsal left foot does not. There is swelling and erythema of his forefoot. He had minor trauma to this several days ago but doesn't think this was enough to have caused any tissue injury. Foot looks like cellulitis, we have had this problem before 06/27/18 on evaluation today patient appears to be doing a little worse in regard to his foot ulcer. 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 Both of which would really be his only options for treating this infection. In the past he has been given and effusion of Orbactiv. This is done very well for him in the past again it's one time dosing IV antibiotic therapy. Subsequently I do believe this is something we're gonna need to see about doing at this point in time. Currently his other wounds seem to be doing somewhat better in my pinion I'm pretty happy in that regard. 07/03/18 on evaluation today patient's wounds actually appear to be doing fairly well. He has been tolerating the dressing changes without complication. All in all he seems to be showing signs of improvement. In regard to the antibiotics he has been dealing with infectious disease since I saw him last week as far as getting this scheduled. In the end [facility]'s [facility] this coming Friday. In the meantime he has been continuing to perform the  dressing changes in such as previous. There does not appear to be any evidence of infection worsengin at this time. 07/10/18; ooSince I last saw this man 2 weeks ago things have actually improved. IV antibiotics of resulted in less forefoot erythema although there is still some present. He is not systemically unwell ooLeft buttock wounds o2 now have no depth there is increased epithelialization Using silver alginate ooLeft heel still requires debridement using Iodoflex ooLeft dorsal foot still with a sizable wound about the size of a border but healthy granulation ooRight dorsal foot still with a slitlike area using silver alginate 07/18/18; the patient's cellulitis in the left foot is improved in fact I think it is on its  way to resolving. ooLeft buttock wounds o2 both look better although the larger one has hypertension granulation we've been using silver alginate ooLeft heel has some thick circumferential redundant skin over the wound edge which will need to be removed today we've been using Iodoflex ooLeft dorsal foot is still a sizable wound required debridement using silver alginate ooThe right dorsal foot is just about closed only a small open area remains here 07/25/18; left foot cellulitis is resolved ooLeft buttock wounds o2 both look better. Hyper-granulation on the major area ooLeft heel as some debris over the surface but otherwise looks a healthier wound. Using silver collagen ooRight dorsal foot is just about closed 07/31/18; arrives with our intake nurse worried about purulent drainage from the buttock. We had hyper-granulation here last week ooHis buttock wounds o2 continue to look better ooLeft heel some debris over the surface but measuring smaller. ooRight dorsal foot unfortunately has openings between the toes ooLeft foot superficial wound looks less aggravated. 08/07/18 ooButtock wounds continue to look better although some of her granulation and the  larger medial wound. silver alginate ooLeft heel continues to look a lot better.silver collagen ooLeft foot superficial wound looks less stable. Requires debridement. He has a new wound superficial area on the foot on the lateral dorsal foot. ooRight foot looks better using silver alginate without Lotrisone 08/14/2018; patient was in the ER last week diagnosed with a UTI. He is now on Cefpodoxime and Macrodantin. ooButtock wounds continued to be smaller. Using silver alginate ooLeft heel continues to look better using silver collagen ooLeft foot superficial wound looks as though it is improving ooRight dorsal foot area is just about healed. 08/21/2018; patient is completed his antibiotics for his UTI. ooHe has 2 open areas on the buttocks. There is still not closed although the surface looks satisfactory. Using silver alginate ooLeft heel continues to improve using silver collagen ooThe bilateral dorsal foot areas which are at the base of his first and second toes/possible tinea pedis are actually stable on the left but worse on the right. The area on the left required debridement of necrotic surface. After debridement I obtained a specimen for PCR culture. ooThe right dorsal foot which is been just about healed last week is now reopened 08/28/2018; culture done on the left dorsal foot showed coag negative staph both staph epidermidis and Lugdunensis. I think this is worthwhile initiating systemic treatment. I will use doxycycline given his long list of allergies. The area on the left heel slightly improved but still requiring debridement. ooThe large wound on the buttock is just about closed whereas the smaller one is larger. Using silver alginate in this area 09/04/2018; patient is completing his doxycycline for the left foot although this continues to be a very difficult wound area with very adherent necrotic debris. We are using silver alginate to all his wounds right foot left  foot and the small wounds on his buttock, silver collagen on the left heel. 09/11/2018; once again this patient has intense erythema and swelling of the left forefoot. Lesser degrees of erythema in the right foot. He has a long list of allergies and intolerances. I will reinstitute doxycycline. 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 Using silver alginate ooLeft heel also looks better using silver collagen ooUnfortunately both the areas on his feet look worse. The area on the left first second webspace is now gone through to the plantar part of his foot. The  area on the left foot anteriorly is irritated with erythema and swelling in the forefoot. 09/25/2018 ooHis wound on the left plantar heel looks better. Using silver collagen ooThe area on the left buttock 2 small remnant areas. One is closed one is still open. Using silver alginate ooThe areas between both his first and second toes look worse. This in spite of long-standing antifungal therapy with ketoconazole and silver alginate which should have antifungal activity ooHe has small areas around his original wound on the left calf one is on the bottom of the original scar tissue and one superiorly both of these are small and superficial but again given wound history in this site this is worrisome 10/02/2018 ooLeft plantar heel continues to gradually contract using silver collagen ooLeft buttock wound is unchanged using silver alginate ooThe areas on his dorsal feet between his first and second toes bilaterally look about the same. 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 Small open area here. We have been using silver alginate 10/09/2018; ooLeft plantar heel continues to look healthy and contract.  No debridement is required ooLeft buttock slightly smaller with a tape injury wound just below which was new this week ooDorsal feet somewhat improved I have been using clindamycin ooLeft lateral looks lower extremity the actual open area looks worse although a lot of this is epithelialized. I am going to change to silver collagen today He has a lot more swelling in the right leg although this is not pitting not red and not particularly warm there is a lot of spasm in the right leg usually indicative of people with paralysis of some underlying discomfort. We have reviewed his vascular status from 2017 he had a left greater saphenous vein ablation. I wonder about referring him back to vascular surgery if the area on the left leg continues to deteriorate. 10/16/2018 in today for follow-up and management of multiple lower extremity ulcers. His left Buttock wound is much lower smaller and almost closed completely. The wound to the left ankle has began to reopen with Epithelialization and some adherent slough. He has multiple new areas to the left foot and leg. The left dorsal foot without much improvement. Wound present between left great webspace and 2nd toe. Erythema and edema present right leg. Right LE ultrasound obtained on 10/10/18 was negative for DVT. 10/23/2018; ooLeft buttock is closed over. Still dry macerated skin but there is no open wound. I suspect this is chronic pressure/moisture ooLeft lateral calf is quite a bit worse than when I saw this last. There is clearly drainage here he has macerated skin into the left plantar heel. We will change the primary dressing to alginate ooLeft dorsal foot has some improvement in overall wound area. Still using clindamycin and silver alginate ooRight dorsal foot about the same as the left using clindamycin and silver alginate ooThe erythema in the right leg has resolved. He is DVT rule out was negative ooLeft heel pressure area required  debridement although the wound is smaller and the surface is health 10/26/2018 ooThe patient came back in for his nurse check today predominantly because of the drainage coming out of the left lateral leg with a recent reopening of his original wound on the left lateral calf. He comes in today with a large amount of surrounding erythema around the wound extending from the calf  into the ankle and even in the area on the dorsal foot. He is not systemically unwell. He is not febrile. Nevertheless this looks like cellulitis. We have been using silver alginate to the area. I changed him to a regular visit and I am going to prescribe him doxycycline. The rationale here is a long list of medication intolerances and a history of MRSA. I did not see anything that I thought would provide a valuable culture 10/30/2018 ooFollow-up from his appointment 4 days ago with really an extensive area of cellulitis in the left calf left lateral ankle and left dorsal foot. I put him on doxycycline. He has a long list of medication allergies which are true allergy reactions. 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 In any case he is a lot better today. The erythema especially in the anterior and lateral left calf is better. He still has left ankle erythema. He also is complaining about increasing edema in the right leg we have only been using Kerlix Coban and he has been doing the wraps at home. Finally he has a spotty rash on the medial part of his upper left calf which looks like folliculitis or perhaps wrap occlusion type injury. Small superficial macules not pustules 11/06/18 patient arrives today with again a considerable degree of erythema around the wound on the left lateral calf extending into the dorsal ankle and dorsal foot. This is a lot worse than when I saw this last week. He is on doxycycline really with not a lot of improvement. He has  not been systemically unwell Wounds on the; left heel actually looks improved. Original area on the left foot and proximity to the first and second toes looks about the same. He has superficial areas on the dorsal foot, anterior calf and then the reopening of his original wound on the left lateral calf which looks about the same ooThe only area he has on the right is the dorsal webspace first and second which is smaller. ooHe has a large area of dry erythematous skin on the left buttock small open area here. 11/13/2018; the patient arrives in much better condition. The erythema around the wound on the left lateral calf is a lot better. Not sure whether this was the clindamycin or the TCA and ketoconazole or just in the improvement in edema control [stasis dermatitis]. In any case this is a lot better. The area on the left heel is very small and just about resolved using silver collagen we have been using silver alginate to the areas on his dorsal feet 11/20/2018; his wounds include the left lateral calf, left heel, dorsal aspects of both feet just proximal to the first second webspace. He is stable to slightly improved. I did not think any changes to his dressings were going to be necessary 11/27/2018 he has a reopening on the left buttock which is surrounded by what looks like tinea or perhaps some other form of dermatitis. The area on the left dorsal foot has some erythema around it I have marked this area but I am not sure whether this is cellulitis or not. Left heel is not closed. Left calf the reopening is really slightly longer and probably worse 1/13; in general things look better and smaller except for the left dorsal foot. Area on the left heel is just about closed, left buttock looks better only a small wound remains in the skin looks better [using Lotrisone]  1/20; the area on the left heel only has a few remaining open areas here. Left lateral calf about the same in terms of size, left  dorsal foot slightly larger right lateral foot still not closed. The area on the left buttock has no open wound and the surrounding skin looks a lot better 1/27; the area on the left heel is closed. Left lateral calf better but still requiring extensive debridements. The area on his left buttock is closed. He still has the open areas on the left dorsal foot which is slightly smaller in the right foot which is slightly expanded. We have been using Iodoflex on these areas as well 2/3; left heel is closed. Left lateral calf still requiring debridement using Iodoflex there is no open area on his left buttock however he has dry scaly skin over a large area of this. Not really responding well to the Lotrisone. Finally the areas on his dorsal feet at the level of the first second webspace are slightly smaller on the right and about the same on the left. Both of these vigorously debrided with Anasept and gauze 2/10; left heel remains closed he has dry erythematous skin over the left buttock but there is no open wound here. Left lateral leg has come in and with. Still requiring debridement we have been using Iodoflex here. Finally the area on the left dorsal foot and right dorsal foot are really about the same extremely dry callused fissured areas. He does not yet have a dermatology appointment 2/17; left heel remains closed. He has a new open area on the left buttock. The area on the left lateral calf is bigger longer and still covered in necrotic debris. No major change in his foot areas bilaterally. I am awaiting for a dermatologist to look on this. We have been using ketoconazole I do not know that this is been doing any good at all. 2/24; left heel remains closed. The left buttock wound that was new reopening last week looks better. The left lateral calf appears better also although still requires debridement. The major area on his foot is the left first second also requiring debridement. We have been  putting Prisma on all wounds. I do not believe that the ketoconazole has done too much good for his feet. He will use Lotrisone I am going to give him a 2-week course of terbinafine. We still do not have a dermatology appointment 3/2 left heel remains closed however there is skin over bone in this area I pointed this out to him today. The left buttock wound is epithelialized but still does not look completely stable. The area on the left leg required debridement were using silver collagen here. With regards to his feet we changed to Lotrisone last week and silver alginate. 3/9; left heel remains closed. Left buttock remains closed. The area on the right foot is essentially closed. The left foot remains unchanged. Slightly smaller on the left lateral calf. Using silver collagen to both of these areas 3/16-Left heel remains closed. Area on right foot is closed. Left lateral calf above the lateral malleolus open wound requiring debridement with easy bleeding. Left dorsal wound proximal to first toe also debrided. Left ischial area open new. Patient has been using Prisma with wrapping every 3 days. Dermatology appointment is apparently tomorrow.Patient has completed his terbinafine 2-week course with some apparent improvement according to him, there is still flaking and dry skin in his foot on the left 3/23; area on the right foot  is reopened. The area on the left anterior foot is about the same still a very necrotic adherent surface. He still has the area on the left leg and reopening is on the left buttock. He apparently saw dermatology although I do not have a note. According to the patient who is usually fairly well informed they did not have any good ideas. Put him on oral terbinafine which she is been on before. 3/30; using silver collagen to all wounds. Apparently his dermatologist put him on doxycycline and rifampin presumably some culture grew staph. I do not have this result. He remains on  terbinafine although I have used terbinafine on him before 4/6; patient has had a fairly substantial reopening on the right foot between the first and second toes. He is finished his terbinafine and I believe is on doxycycline and rifampin still as prescribed by dermatology. We have been using silver collagen to all his wounds although the patient reports that he thinks silver alginate does better on the wounds on his buttock. 4/13; the area on his left lateral calf about the same size but it did not require debridement. ooLeft dorsal foot just proximal to the webspace between the first and second toes is about the same. Still nonviable surface. I note some superficial bronze discoloration of the dorsal part of his foot ooRight dorsal foot just proximal to the first and second toes also looks about the same. I still think there may be the same discoloration I noted above on the left ooLeft buttock wound looks about the same 4/20; left lateral calf appears to be gradually contracting using silver collagen. 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 The left dorsal foot wound is debrided of tightly adherent necrotic debris and really cleans up quite nicely. The right area is worse with expansion. I did not debride this it is now over the base of the second toe ooThe area on his left buttock is smaller no debridement is required using silver collagen 5/4; left calf continues to make good progress. ooHe arrives with erythema around the wounds on his dorsal foot which even extends to the plantar aspect. Very concerning for coexistent infection. He is finished the erythromycin I gave him for possible erythrasma this does not seem to have helped. ooThe area on the left foot is about the same base of the dorsal toes ooIs area on the buttock looks improved on the left 5/11; left calf and left buttock continued to make good progress. Left  foot is about the same to slightly improved. ooMajor problem is on the right foot. He has not had an x-ray. Deep tissue culture I did last week showed both Enterobacter and E. coli. I did not change the doxycycline I put him on empirically although neither 1 of these were plated to doxycycline. He arrives today with the erythema looking worse on both the dorsal and plantar foot. Macerated skin on the bottom of the foot. he has not been systemically unwell 5/18-Patient returns at 1 week, left calf wound appears to be making some progress, left buttock wound appears slightly worse than last time, left foot wound looks slightly better, right foot redness is marginally better. X-ray of both feet show no air or evidence of osteomyelitis. Patient is finished his Omnicef and terbinafine. He continues to have macerated skin on the bottom of the left foot as well as right 5/26; left calf wound is better, left buttock wound appears to have multiple  small superficial open areas with surrounding macerated skin. X-rays that I did last time showed no evidence of osteomyelitis in either foot. He is finished cefdinir and doxycycline. I do not think that he was on terbinafine. He continues to have a large superficial open area on the right foot anterior dorsal and slightly between the first and second toes. I did send him to dermatology 2 months ago or so wondering about whether they would do a fungal scraping. I do not believe they did but did do a culture. We have been using silver alginate to the toe areas, he has been using antifungals at home topically either ketoconazole or Lotrisone. We are using silver collagen on the left foot, silver alginate on the right, silver collagen on the left lateral leg and silver alginate on the left buttock 6/1; left buttock area is healed. We have the left dorsal foot, left lateral leg and right dorsal foot. We are using silver alginate to the areas on both feet and silver  collagen to the area on his left lateral calf 6/8; the left buttock apparently reopened late last week. He is not really sure how this happened. He is tolerating the terbinafine. Using silver alginate to all wounds 6/15; left buttock wound is larger than last week but still superficial. ooCame in the clinic today with a report of purulence from the left lateral leg I did not identify any infection ooBoth areas on his dorsal feet appear to be better. He is tolerating the terbinafine. Using silver alginate to all wounds 6/22; left buttock is about the same this week, left calf quite a bit better. His left foot is about the same however he comes in with erythema and warmth in the right forefoot once again. Culture that I gave him in the beginning of May showed Enterobacter and E. coli. I gave him doxycycline and things seem to improve although neither 1 of these organisms was specifically plated. 6/29; left buttock is larger and dry this week. Left lateral calf looks to me to be improved. Left dorsal foot also somewhat improved right foot completely unchanged. The erythema on the right foot is still present. He is completing the Ceftin dinner that I gave him empirically [see discussion above.) 7/6 - All wounds look to be stable and perhaps improved, the left buttock wound is slightly smaller, per patient bleeds easily, completed ceftin, the right foot redness is less, he is on terbinafine 7/13; left buttock wound about the same perhaps slightly narrower. Area on the left lateral leg continues to narrow. Left dorsal foot slightly smaller right foot about the same. We are using silver alginate on the right foot and Hydrofera Blue to the areas on the left. Unna boot on the left 2 layer compression on the right 7/20; left buttock wound absolutely the same. Area on lateral leg continues to get better. Left dorsal foot require debridement as did the right no major change in the 7/27; left buttock wound  the same size necrotic debris over the surface. The area on the lateral leg is closed once again. His left foot looks better right foot about the same although there is some involvement now of the posterior first second toe area. He is still on terbinafine which I have given him for a month, not certain a centimeter major change 06/25/19-All wounds appear to be slightly improved according to report, left buttock wound looks clean, both foot wounds have minimal to no debris the right dorsal foot has minimal  slough. We are using Hydrofera Blue to the left and silver alginate to the right foot and ischial wound. 8/10-Wounds all appear to be around the same, the right forefoot distal part has some redness which was not there before, however the wound looks clean and small. Ischial wound looks about the same with no changes 8/17; his wound on the left lateral calf which was his original chronic venous insufficiency wound remains closed. Since I last saw him the areas on the left dorsal foot right dorsal foot generally appear better but require debridement. The area on his left initial tuberosity appears somewhat larger to me perhaps hyper granulated and bleeds very easily. We have been using Hydrofera Blue to the left dorsal foot and silver alginate to everything else 8/24; left lateral calf remains closed. The areas on his dorsal feet on the webspace of the first and second toes bilaterally both look better. The area on the left buttock which is the pressure ulcer stage II slightly smaller. I change the dressing to Hydrofera Blue to all areas 8/31; left lateral calf remains closed. The area on his dorsal feet bilaterally look better. Using Hydrofera Blue. Still requiring debridement on the left foot. No change in the left buttock pressure ulcers however 9/14; left lateral calf remains closed. Dorsal feet look quite a bit better than 2 weeks ago. Flaking dry skin also a lot better with the ammonium  lactate I gave him 2 weeks ago. The area on the left buttock is improved. He states that his Roho cushion developed a leak and he is getting a new one, in the interim he is offloading this vigorously 9/21; left calf remains closed. Left heel which was a possible DTI looks better this week. He had macerated tissue around the left dorsal foot right foot looks satisfactory and improved left buttock wound. I changed his dressings to his feet to silver alginate bilaterally. Continuing Hydrofera Blue on the left buttock. 9/28 left calf remains closed. Left heel did not develop anything [possible DTI] dry flaking skin on the left dorsal foot. Right foot looks satisfactory. Improved left buttock wound. We are using silver alginate on his feet Hydrofera Blue on the buttock. I have asked him to go back to the Lotrisone on his feet including the wounds and surrounding areas 10/5; left calf remains closed. The areas on the left and right feet about the same. A lot of this is epithelialized however debris over the remaining open areas. He is using Lotrisone and silver alginate. The area on the left buttock using Hydrofera Blue Objective Constitutional Sitting or standing Blood Pressure is within target range for patient.. Pulse regular and within target range for patient.Marland Kitchen Respirations regular, non-labored and within target range.. Temperature is normal and within the target range for the patient.Marland Kitchen Appears in no distress. Vitals Time Taken: 7:48 AM, Height: 70 in, Weight: 216 lbs, BMI: 31, Temperature: 98.2 F, Pulse: 127 bpm, Respiratory Rate: 16 breaths/min, Blood Pressure: 134/76 mmHg. General Notes: will recheck pulse rate. General Notes: Wound exam; left dorsal foot very superficial some areas of this are epithelialized. Using a #3 curette fibrinous debris removed off the areas that were not epithelialized. ooSimilarly on the right foot. In general the areas on both of these look better ooLeft  buttock wound small open areas remain. Skin looks better Integumentary (Hair, Skin) Wound #17 status is Open. Original cause of wound was Gradually Appeared. The wound is located on the Right,Dorsal Toe - Web between 1st and 2nd.  The wound measures 1.5cm length x 3cm width x 0.1cm depth; 3.534cm^2 area and 0.353cm^3 volume. There is Fat Layer (Subcutaneous Tissue) Exposed exposed. There is no tunneling or undermining noted. There is a medium amount of serosanguineous drainage noted. The wound margin is distinct with the outline attached to the wound base. There is large (67-100%) red, pink granulation within the wound bed. There is a small (1-33%) amount of necrotic tissue within the wound bed including Adherent Slough. Wound #24 status is Open. Original cause of wound was Gradually Appeared. The wound is located on the Left,Dorsal Foot. The wound measures 2cm length x 3.8cm width x 0.1cm depth; 5.969cm^2 area and 0.597cm^3 volume. There is Fat Layer (Subcutaneous Tissue) Exposed exposed. There is no tunneling or undermining noted. There is a medium amount of serosanguineous drainage noted. The wound margin is distinct with the outline attached to the wound base. There is large (67-100%) pink granulation within the wound bed. There is a small (1- 33%) amount of necrotic tissue within the wound bed including Adherent Slough. Wound #35 status is Open. Original cause of wound was Gradually Appeared. The wound is located on the Left Ischium. The wound measures 0.8cm length x 0.4cm width x 0.1cm depth; 0.251cm^2 area and 0.025cm^3 volume. There is Fat Layer (Subcutaneous Tissue) Exposed exposed. There is no tunneling or undermining noted. There is a medium amount of serosanguineous drainage noted. The wound margin is flat and intact. There is large (67-100%) red, friable granulation within the wound bed. There is no necrotic tissue within the wound bed. 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 There was a Excisional Skin/Subcutaneous Tissue Debridement with a total area of 7.6 sq cm performed by Ricard Dillon., MD. With the following instrument(s): Curette to remove Viable and Non-Viable tissue/material. Material removed includes Subcutaneous Tissue. No specimens were taken. A time out was conducted at 08:06, prior to the start of the procedure. There was no bleeding. The procedure was tolerated well with a pain level of 0 throughout and a pain level of 0 following the procedure. Post Debridement Measurements: 2cm length x 3.8cm width x 0.1cm depth; 0.597cm^3 volume. Character of Wound/Ulcer Post Debridement is improved. Post procedure Diagnosis Wound #24: Same as Pre-Procedure Plan Follow-up Appointments: Return Appointment in 1 week. Dressing Change Frequency: Wound #17 Right,Dorsal Toe - Web between 1st and 2nd: Change Dressing every other day. Wound #24 Left,Dorsal Foot: Change Dressing every other day. Wound #35 Left Ischium: Change Dressing every other day. Skin Barriers/Peri-Wound Care: Antifungal cream - on toes on both feet daily Moisturizing lotion - both legs Other: - Triamcinolone cream Wound #35 Left Ischium: Barrier cream Wound Cleansing: Wound #17 Right,Dorsal Toe - Web between 1st and 2nd: Clean wound with Wound Cleanser Primary Wound Dressing: Wound #17 Right,Dorsal Toe - Web between 1st and 2nd: Calcium Alginate with Silver Wound #24 Left,Dorsal Foot: Calcium Alginate with Silver Wound #35 Left Ischium: Hydrofera Blue Secondary Dressing: Wound #17 Right,Dorsal Toe - Web between 1st and 2nd: Kerlix/Rolled Gauze Dry  Gauze Wound #24 Left,Dorsal Foot: Kerlix/Rolled Gauze Dry Gauze Heel Cup - add heel cup to left heel for protection. Wound #35 Left Ischium: Foam Border Edema Control: Elevate legs to  the level of the heart or above for 30 minutes daily and/or when sitting, a frequency of: - throughout the day Support Garment 30-40 mm/Hg pressure to: - Juxtalite to both legs. Off-Loading: Low air-loss mattress (Group 2) Roho cushion for wheelchair Turn and reposition every 2 hours - out of wheelchair throughout the day, try to lay on sides, sleep in the bed not the recliner 1. I did not change the current dressings which are Lotrisone and silver alginate to the feet. 2. Hydrofera Blue to the buttock. The buttock wound is continuing to improve as is the condition of his skin. He is using zinc oxide Electronic Signature(s) Signed: 08/27/2019 6:11:18 PM By: Linton Ham MD Entered By: Linton Ham on 08/27/2019 08:34:00 -------------------------------------------------------------------------------- SuperBill Details Patient Name: Date of Service: CRIBBHewey, [place] 08/27/2019 Medical Record [id number] Patient Account Number: [id number] Date of Birth/Sex: Treating RN: [birth year] (33 y.o. [name] [name] Primary Care Provider: [place], [place] Other Clinician: Sandre Kitty Referring Provider: Treating Provider/Extender:Tianne Plott, [name], [name]: 190 Diagnosis Coding ICD-10 Codes Code Description L97.511 Non-pressure chronic ulcer of other part of right 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 L97.521 Non-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, complete L89.302 Pressure ulcer of unspecified buttock, stage 2 Facility Procedures CPT4 Code Description: [id number] - DEB SUBQ TISSUE 20 SQ CM/< ICD-10 Diagnosis Description L97.511 Non-pressure chronic ulcer of other part of right foot limit L97.521 Non-pressure  chronic ulcer of other part of left foot limit Modifier: ed to breakdown ed to breakdown Quantity: 1 of skin of skin Physician Procedures CPT4 Code Description: [id number] - WC PHYS SUBQ TISS 20 SQ CM ICD-10 Diagnosis Description L97.511 Non-pressure chronic ulcer of other part of right foot limi L97.521 Non-pressure chronic ulcer of other part of left foot limit Modifier: ted to breakdown ed to breakdown Quantity: 1 of skin of skin Electronic Signature(s) Signed: 08/27/2019 6:11:18 PM By: Linton Ham MD Entered By: Linton Ham on 08/27/2019 08:34:27

## 2019-09-01 DIAGNOSIS — Z20828 Contact with and (suspected) exposure to other viral communicable diseases: Secondary | ICD-10-CM | POA: Diagnosis not present

## 2019-09-01 DIAGNOSIS — R300 Dysuria: Secondary | ICD-10-CM | POA: Diagnosis not present

## 2019-09-01 DIAGNOSIS — R8290 Unspecified abnormal findings in urine: Secondary | ICD-10-CM | POA: Diagnosis not present

## 2019-09-03 ENCOUNTER — Encounter (HOSPITAL_BASED_OUTPATIENT_CLINIC_OR_DEPARTMENT_OTHER): Payer: BC Managed Care – PPO | Admitting: Internal Medicine

## 2019-09-10 ENCOUNTER — Encounter (HOSPITAL_BASED_OUTPATIENT_CLINIC_OR_DEPARTMENT_OTHER): Payer: BC Managed Care – PPO | Admitting: Internal Medicine

## 2019-09-17 ENCOUNTER — Encounter (HOSPITAL_BASED_OUTPATIENT_CLINIC_OR_DEPARTMENT_OTHER): Payer: BC Managed Care – PPO | Admitting: Internal Medicine

## 2019-09-17 ENCOUNTER — Other Ambulatory Visit: Payer: Self-pay

## 2019-09-17 DIAGNOSIS — L89322 Pressure ulcer of left buttock, stage 2: Secondary | ICD-10-CM | POA: Diagnosis not present

## 2019-09-17 DIAGNOSIS — L97512 Non-pressure chronic ulcer of other part of right foot with fat layer exposed: Secondary | ICD-10-CM | POA: Diagnosis not present

## 2019-09-17 DIAGNOSIS — G4730 Sleep apnea, unspecified: Secondary | ICD-10-CM | POA: Diagnosis not present

## 2019-09-17 DIAGNOSIS — S31829A Unspecified open wound of left buttock, initial encounter: Secondary | ICD-10-CM | POA: Diagnosis not present

## 2019-09-17 DIAGNOSIS — S91104A Unspecified open wound of right lesser toe(s) without damage to nail, initial encounter: Secondary | ICD-10-CM | POA: Diagnosis not present

## 2019-09-17 DIAGNOSIS — S91302A Unspecified open wound, left foot, initial encounter: Secondary | ICD-10-CM | POA: Diagnosis not present

## 2019-09-17 DIAGNOSIS — L03116 Cellulitis of left lower limb: Secondary | ICD-10-CM | POA: Diagnosis not present

## 2019-09-17 DIAGNOSIS — G8221 Paraplegia, complete: Secondary | ICD-10-CM | POA: Diagnosis not present

## 2019-09-17 DIAGNOSIS — L97922 Non-pressure chronic ulcer of unspecified part of left lower leg with fat layer exposed: Secondary | ICD-10-CM | POA: Diagnosis not present

## 2019-09-17 DIAGNOSIS — L97522 Non-pressure chronic ulcer of other part of left foot with fat layer exposed: Secondary | ICD-10-CM | POA: Diagnosis not present

## 2019-09-17 DIAGNOSIS — I10 Essential (primary) hypertension: Secondary | ICD-10-CM | POA: Diagnosis not present

## 2019-09-17 NOTE — Progress Notes (Addendum)
[name], [name] ([id number]) Visit Report for 09/17/2019 Arrival Information Details Patient Name: Date of Service: [name], [name] 09/17/2019 7:30 AM Medical Record [id number] Patient Account Number: [id number] Date of Birth/Sex: Treating RN: 06/20/1986 (33 y.o. [name] Primary Care Jessen Siegman: [name], [name] Other Clinician: Referring Tito Ausmus: Treating Corrina Steffensen/Extender:Robson, [name], [name]: 193 Visit Information History Since Last Visit Added or deleted any medications: No Patient Arrived: Wheel Chair Any new allergies or adverse reactions: No Arrival Time: 07:59 Had a fall or experienced change in No activities of daily living that may affect Accompanied By: self risk of falls: Transfer Assistance: None Signs or symptoms of abuse/neglect since last No Patient Identification Verified: Yes visito Secondary Verification Process Completed: Yes Hospitalized since last visit: No Patient Requires Transmission-Based No Implantable device outside of the clinic excluding No Precautions: cellular tissue based products placed in the center Patient Has Alerts: Yes since last visit: Patient Alerts: R ABI = Has Dressing in Place as Prescribed: Yes 1.0 Has Compression in Place as Prescribed: Yes L ABI = Pain Present Now: No 1.1 Electronic Signature(s) Signed: 09/17/2019 5:31:38 PM By: Deon Pilling Entered By: Deon Pilling on 09/17/2019 07:59:21 -------------------------------------------------------------------------------- Encounter Discharge Information Details Patient Name: Date of Service: [name], [name] 09/17/2019 7:30 AM Medical Record [id number] Patient Account Number: [id number] Date of Birth/Sex: Treating RN: 02/15/1986 (33 y.o. [name] Primary Care Weslynn Ke: [place], [place] Other Clinician: Referring Alyannah Sanks: Treating Jackelynn Hosie/Extender:Robson, [name], [name]: 193 Encounter  Discharge Information Items Post Procedure Vitals Discharge Condition: Stable Temperature (F): 98.6 Ambulatory Status: Wheelchair Pulse (bpm): 110 Discharge Destination: Home Respiratory Rate (breaths/min): 20 Transportation: Other Blood Pressure (mmHg): 126/77 Accompanied By: self Schedule Follow-up Appointment: Yes Clinical Summary of Care: Patient Declined Electronic Signature(s) Signed: 09/17/2019 5:21:50 PM By: Kela Millin Entered By: Kela Millin on 09/17/2019 08:49:13 -------------------------------------------------------------------------------- Lower Extremity Assessment Details Patient Name: Date of Service: [name], [name] 09/17/2019 7:30 AM Medical Record [id number] Patient Account Number: [id number] Date of Birth/Sex: Treating RN: 07-22-1986 (33 y.o. [name] Primary Care Victorhugo Preis: [place], [place] Other Clinician: Referring Takiah Maiden: Treating Kaysee Hergert/Extender:Robson, [name], [name]: 193 Edema Assessment Assessed: [Left: Yes] [Right: Yes] Edema: [Left: No] [Right: No] Calf Left: Right: Point of Measurement: 33 cm From Medial Instep 30 cm 34 cm Ankle Left: Right: Point of Measurement: 10 cm From Medial Instep 22 cm 24.5 cm Vascular Assessment Pulses: Dorsalis Pedis Palpable: [Left:Yes] [Right:Yes] Electronic Signature(s) Signed: 09/17/2019 5:31:38 PM By: Deon Pilling Entered By: Deon Pilling on 09/17/2019 08:00:46 -------------------------------------------------------------------------------- Multi Wound Chart Details Patient Name: Date of Service: [name], [name] 09/17/2019 7:30 AM Medical Record [id number] Patient Account Number: [id number] Date of Birth/Sex: Treating RN: 14-May-1986 (33 y.o. [name] Primary Care Choya Tornow: Other Clinician: Janine Limbo Referring Raynard Mapps: Treating Viridiana Spaid/Extender:Robson, [name], [name]: 193 Vital Signs Height(in):  70 Pulse(bpm): 110 Weight(lbs): 216 Blood Pressure(mmHg): 126/77 Body Mass Index(BMI): 31 Temperature(F): 98.6 Respiratory 20 Rate(breaths/min): Photos: [17:No Photos] [24:No Photos] [35:No Photos] Wound Location: [17:Right Toe - Web between Left Foot - Dorsal 1st and 2nd - Dorsal] [35:Left Ischium] Wounding Event: [17:Gradually Appeared] [24:Gradually Appeared] [35:Gradually Appeared] Primary Etiology: [17:Atypical] [24:Inflammatory] [35:Pressure Ulcer] Comorbid History: [17:Sleep Apnea, Hypertension, Sleep Apnea, Hypertension, Sleep Apnea, Hypertension, Paraplegia] [24:Paraplegia] [35:Paraplegia] Date Acquired: [17:04/07/2017] [24:03/08/2018] [35:04/28/2019] Weeks of Treatment: [id number] [24:79] [35:20] Wound Status: [17:Open] [24:Open] [35:Open] Clustered Wound: [17:Yes] [24:No] [35:Yes] Clustered Quantity: [17:2] [24:N/A] [35:2] Measurements L x W x D 6x3x0.1 [24:3.8x5x0.1] [35:5.4x2x1.1] (cm) Area (cm) : [  17:14.137] [24:14.923] [35:8.482] Volume (cm) : [17:1.414] [24:1.492] [id number] % Reduction in Area: [17:-2300.20%] [24:-6223.30%] [35:40.00%] % Reduction in Volume: -2296.60% [24:-6116.70%] [35:-559.90%] Classification: [17:Full Thickness Without Exposed Support Structures Exposed Support Structures] [24:Full Thickness Without] [35:Category/Stage III] Exudate Amount: [17:Medium] [24:Medium] [35:Medium] Exudate Type: [17:Serosanguineous] [24:Serosanguineous] [35:Serosanguineous] Exudate Color: [17:red, brown] [24:red, brown] [35:red, brown] Wound Margin: [17:Distinct, outline attached Distinct, outline attached Flat and Intact] Granulation Amount: [17:Small (1-33%)] [24:Small (1-33%)] [35:Large (67-100%)] Granulation Quality: [17:Pale] [24:Pale] [35:Red, Friable] Necrotic Amount: [17:Large (67-100%)] [24:Large (67-100%)] [35:Small (1-33%)] Exposed Structures: [17:Fat Layer (Subcutaneous Fat Layer (Subcutaneous Fat Layer (Subcutaneous Tissue) Exposed: Yes Fascia: No Tendon: No  Muscle: No Joint: No Bone: No] [24:Tissue) Exposed: Yes Fascia: No Tendon: No Muscle: No Joint: No Bone: No] [35:Tissue) Exposed: Yes  Fascia: No Tendon: No Muscle: No Joint: No Bone: No] Epithelialization: [17:None] [24:None] [35:None] Debridement: [17:N/A] [24:Debridement - Excisional N/A] Pre-procedure [17:N/A] [24:08:25] [35:N/A] Verification/Time Out Taken: Tissue Debrided: [17:N/A] [24:Subcutaneous, Slough] [35:N/A] Level: [17:N/A] [24:Skin/Subcutaneous Tissue N/A] Debridement Area (sq cm):N/A [id number] [35:N/A] Instrument: [17:N/A] [24:Curette] [35:N/A] Bleeding: [17:N/A] [24:Minimum] [35:N/A] Hemostasis Achieved: [17:N/A] [24:Pressure] [35:N/A] Procedural Pain: [17:N/A] [24:0] [35:N/A] Post Procedural Pain: [17:N/A] [24:0] [35:N/A] Debridement Treatment [17:N/A] [24:Procedure was tolerated] [35:N/A] Response: [24:well] Post Debridement [17:N/A] [24:3.8x5x0.1] [35:N/A] Measurements L x W x D (cm) Post Debridement [17:N/A] [24:1.492] [35:N/A] Volume: (cm) Procedures Performed: [17:N/A] [24:Debridement] [35:N/A] Treatment Notes Electronic Signature(s) Signed: 09/17/2019 5:48:14 PM By: Linton Ham MD Signed: 09/17/2019 6:04:10 PM By: Levan Hurst RN, BSN Entered By: Linton Ham on 09/17/2019 08:42:24 -------------------------------------------------------------------------------- Multi-Disciplinary Care Plan Details Patient Name: Date of Service: CRIBBJihan, Lauby 09/17/2019 7:30 AM Medical Record [id number] Patient Account Number: [id number] Date of Birth/Sex: Treating RN: 1986/03/13 (33 y.o. [name] Primary Care Azalea Cedar: [name], [name] Other Clinician: Referring Courtland Coppa: Treating Braidyn Peace/Extender:Robson, [name], [name]: 193 Active Inactive Wound/Skin Impairment Nursing Diagnoses: Impaired tissue integrity Knowledge deficit related to ulceration/compromised skin integrity Goals: Patient/caregiver will verbalize  understanding of skin care regimen Date Initiated: 01/05/2016 Target Resolution Date: 10/19/2019 Goal Status: Active Ulcer/skin breakdown will have a volume reduction of 30% by week 4 Date Initiated: 01/05/2016 Date Inactivated: 12/22/2017 Target Resolution Date: 01/19/2018 Unmet Reason: complex Goal Status: Unmet wounds, infection Interventions: Assess patient/caregiver ability to obtain necessary supplies Assess ulceration(s) every visit Provide education on ulcer and skin care Notes: Electronic Signature(s) Signed: 09/17/2019 6:04:10 PM By: Levan Hurst RN, BSN Entered By: Levan Hurst on 09/17/2019 08:02:30 -------------------------------------------------------------------------------- Pain Assessment Details Patient Name: Date of Service: [name], [name] 09/17/2019 7:30 AM Medical Record [id number] Patient Account Number: [id number] Date of Birth/Sex: Treating RN: Mar 22, 1986 (33 y.o. [name] Primary Care Keyarah Mcroy: [place], [place] Other Clinician: Referring Ajax Schroll: Treating Diyari Cherne/Extender:Robson, [name], [name]: 193 Active Problems Location of Pain Severity and Description of Pain Patient Has Paino No Site Locations Rate the pain. Current Pain Level: 0 Pain Management and Medication Current Pain Management: Medication: No Cold Application: No Rest: No Massage: No Activity: No T.FerrellN.S.: No Heat Application: No Leg drop or elevation: No Is the Current Pain Management Adequate: Adequate How does your wound impact your activities of daily livingo Sleep: No Bathing: No Appetite: No Relationship With Others: No Bladder Continence: No Emotions: No Bowel Continence: No Work: No Toileting: No Drive: No Dressing: No Hobbies: No Electronic Signature(s) Signed: 09/17/2019 5:31:38 PM By: Deon Pilling Entered By: Deon Pilling on 09/17/2019  08:00:27 -------------------------------------------------------------------------------- Patient/Caregiver Education Details Patient Name: Date of Service: [name], [name] 10/26/2020andnbsp7:30 AM Medical Record [id number] Patient Account Number:  [id number] Date of Birth/Gender: 1986-05-27 (33 y.o. M) Treating RN: Levan Hurst Primary Care Physician: Janine Limbo Other Clinician: Referring Physician: Treating Physician/Extender:Robson, Pecola Leisure in Treatment: 25 Education Assessment Education Provided To: Patient Education Topics Provided Wound/Skin Impairment: Methods: Explain/Verbal Responses: State content correctly Electronic Signature(s) Signed: 09/17/2019 6:04:10 PM By: Levan Hurst RN, BSN Entered By: Levan Hurst on 09/17/2019 08:02:44 -------------------------------------------------------------------------------- Wound Assessment Details Patient Name: Date of Service: [name], [name] 09/17/2019 7:30 AM Medical Record [id number] Patient Account Number: [id number] Date of Birth/Sex: Treating RN: 23-Jan-1986 (33 y.o. [name] Primary Care Jahanna Raether: [place], [place] Other Clinician: Referring Noel Rodier: Treating Tanea Moga/Extender:Robson, [name], [name]: 193 Wound Status Wound Number: 17 Primary Etiology: Atypical Wound Location: Right Toe - Web between 1st and Wound Status: Open 2nd - Dorsal Comorbid Sleep Apnea, Hypertension, Wounding Event: Gradually Appeared History: Paraplegia Date Acquired: 04/07/2017 Weeks Of Treatment: 127 Clustered Wound: Yes Photos Wound Measurements Length: (cm) 6 % Reduct Width: (cm) 3 % Reduct Depth: (cm) 0.1 Epitheli Clustered Quantity: 2 Tunnelin Area: (cm) 14.137 Undermi Volume: (cm) 1.414 Wound Description Classification: Full Thickness Without Exposed Support Foul Odo Structures Slough/F Wound Distinct, outline attached Margin: Exudate  Medium Amount: Exudate Serosanguineous Type: Exudate red, brown Color: Wound Bed Granulation Amount: Small (1-33%) Granulation Quality: Pale Fascia E Necrotic Amount: Large (67-100%) Fat Laye Necrotic Quality: Adherent Slough Tendon E Muscle E Joint Ex Bone Exp r After Cleansing: No ibrino Yes Exposed Structure xposed: No r (Subcutaneous Tissue) Exposed: Yes xposed: No xposed: No posed: No osed: No ion in Area: -2300.2% ion in Volume: -2296.6% alization: None g: No ning: No Treatment Notes Wound #17 (Right, Dorsal Toe - Web between 1st and 2nd) 1. Cleanse With Wound Cleanser 2. Periwound Care Barrier cream Moisturizing lotion 3. Primary Dressing Applied Calcium Alginate Ag 4. Secondary Dressing Dry Gauze Roll Gauze Heel Cup 5. Secured With Recruitment consultant) Signed: 09/18/2019 3:44:28 PM By: Mikeal Hawthorne EMT/HBOT Signed: 09/18/2019 5:02:12 PM By: Deon Pilling Previous Signature: 09/17/2019 5:31:38 PM Version By: Deon Pilling Entered By: Mikeal Hawthorne on 09/18/2019 12:30:03 -------------------------------------------------------------------------------- Wound Assessment Details Patient Name: Date of Service: [name], [name] 09/17/2019 7:30 AM Medical Record [id number] Patient Account Number: [id number] Date of Birth/Sex: Treating RN: 01-16-1986 (33 y.o. [name] Primary Care Aziza Stuckert: [place], [place] Other Clinician: Referring Zarif Rathje: Treating Zhana Jeangilles/Extender:Robson, [name], [name]: 193 Wound Status Wound Number: 24 Primary Etiology: Inflammatory Wound Location: Left Foot - Dorsal Wound Status: Open Wounding Event: Gradually Appeared Comorbid Sleep Apnea, Hypertension, History: Paraplegia Date Acquired: 03/08/2018 Weeks Of Treatment: 79 Clustered Wound: No Photos Wound Measurements Length: (cm) 3.8 % Reduct Width: (cm) 5 % Reduct Depth: (cm) 0.1 Epitheli Area: (cm) 14.923  Tunneli Volume: (cm) 1.492 Undermi Wound Description Classification: Full Thickness Without Exposed Support Foul Od Structures Slough/ Wound Distinct, outline attached Margin: Exudate Medium Amount: Exudate Serosanguineous Type: Exudate red, brown Color: Wound Bed Granulation Amount: Small (1-33%) Granulation Quality: Pale Fascia Necrotic Amount: Large (67-100%) Fat Layer Necrotic Quality: Adherent Slough Tendon Ex Muscle Ex Joint Exp Bone Expo or After Cleansing: No Fibrino Yes Exposed Structure Exposed: No (Subcutaneous Tissue) Exposed: Yes posed: No posed: No osed: No sed: No ion in Area: -6223.3% ion in Volume: -6116.7% alization: None ng: No ning: No Treatment Notes Wound #24 (Left, Dorsal Foot) 1. Cleanse With Wound Cleanser 2. Periwound Care Barrier cream Moisturizing lotion 3. Primary Dressing Applied Calcium Alginate Ag 4. Secondary Dressing Dry Gauze Roll Gauze Heel Cup 5.  Secured With Recruitment consultant) Signed: 09/18/2019 3:44:28 PM By: Mikeal Hawthorne EMT/HBOT Signed: 09/18/2019 5:02:12 PM By: Deon Pilling Previous Signature: 09/17/2019 5:31:38 PM Version By: Deon Pilling Entered By: Mikeal Hawthorne on 09/18/2019 12:29:22 -------------------------------------------------------------------------------- Wound Assessment Details Patient Name: Date of Service: [name], [name] 09/17/2019 7:30 AM Medical Record [id number] Patient Account Number: [id number] Date of Birth/Sex: Treating RN: 1986-09-16 (33 y.o. [name] Primary Care Briauna Gilmartin: [place], [place] Other Clinician: Referring Fredrik Mogel: Treating Legna Mausolf/Extender:Robson, [name], [name]: 193 Wound Status Wound Number: 35 Primary Etiology: Pressure Ulcer Wound Location: Left Ischium Wound Status: Open Wounding Event: Gradually Appeared Comorbid Sleep Apnea, Hypertension, History: Paraplegia Date Acquired: 04/28/2019 Weeks Of Treatment:  20 Clustered Wound: Yes Photos Wound Measurements Length: (cm) 5.4 % Reducti Width: (cm) 2 % Reducti Depth: (cm) 1.1 Epithelia Clustered Quantity: 2 Tunneling Area: (cm) 8.482 Undermin Volume: (cm) 9.331 Wound Description Classification: Category/Stage III Wound Margin: Flat and Intact Exudate Amount: Medium Exudate Type: Serosanguineous Exudate Color: red, brown Wound Bed Granulation Amount: Large (67-100%) Granulation Quality: Red, Friable Necrotic Amount: Small (1-33%) Necrotic Quality: Adherent Slough Foul Odor After Cleansing: No Slough/Fibrino Yes Exposed Structure Fascia Exposed: No Fat Layer (Subcutaneous Tissue) Exposed: Yes Tendon Exposed: No Muscle Exposed: No Joint Exposed: No Bone Exposed: No on in Area: 40% on in Volume: -559.9% lization: None : No ing: No Treatment Notes Wound #35 (Left Ischium) 1. Cleanse With Wound Cleanser 2. Periwound Care Barrier cream 3. Primary Dressing Applied Hydrofera Blue 4. Secondary Dressing Foam Border Dressing Electronic Signature(s) Signed: 09/18/2019 3:44:28 PM By: Mikeal Hawthorne EMT/HBOT Signed: 09/18/2019 5:02:12 PM By: Deon Pilling Previous Signature: 09/17/2019 5:31:38 PM Version By: Deon Pilling Entered By: Mikeal Hawthorne on 09/18/2019 12:27:35 -------------------------------------------------------------------------------- Vitals Details Patient Name: Date of Service: [name], [name] 09/17/2019 7:30 AM Medical Record [id number] Patient Account Number: [id number] Date of Birth/Sex: Treating RN: 1986-10-22 (33 y.o. [name] Primary Care Noemy Hallmon: [place], [place] Other Clinician: Referring Lovett Coffin: Treating Adell Panek/Extender:Robson, [name], [name]: 193 Vital Signs Time Taken: 07:59 Temperature (F): 98.6 Height (in): 70 Pulse (bpm): 110 Weight (lbs): 216 Respiratory Rate (breaths/min): 20 Body Mass Index (BMI): 31 Blood Pressure (mmHg): 126/77 Reference  Range: 80 - 120 mg / dl Electronic Signature(s) Signed: 09/17/2019 5:31:38 PM By: Deon Pilling Entered By: Deon Pilling on 09/17/2019 08:00:18

## 2019-09-17 NOTE — Progress Notes (Signed)
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 been refractory to multiple dressing attempts. He also has areas in his first and second toes bilaterally in the anterior and posterior web space. I had been using Iodoflex here after a prolonged course of silver alginate with ketoconazole was ineffective [question tinea pedis] 07/14/17; patient arrives today with a very difficult adherent material over his left lateral lower leg wound. He also has surrounding erythema and poorly controlled edema. He was switched his Santyl last visit which the nurses are applying once during his doctor visit and once on a nurse visit. He was also reduced to 2 layer compression I'm not exactly sure of the issue here. 07/21/17; better surface today after 1 week of Iodoflex. Significant cellulitis that we treated last week also better. [Doxycycline] 07/28/17 better surface today with now 2 weeks of Iodoflex. Significant cellulitis treated with doxycycline. He has now completed the doxycycline and he is back to his usual degree of chronic venous inflammation/stasis dermatitis.  He reminds me he has had ablations surgery here 08/04/17; continued improvement with Iodoflex to the left lateral  leg wound in terms of the surface of the wound although the dimensions are better. 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 Problematic areas on the plantar aspect of the first second toe web space on the left and the dorsal aspect of the first second toe web space on the right. At one point I felt these were probably related to chronic fungal infections in treated him aggressively for this although we have not made any improvement here. 08/11/17; left lateral leg. Surface continues to improve with the Iodoflex although we are not seeing much improvement in overall wound dimensions. Areas on his plantar left foot and right foot show no improvement. In fact the right foot looks somewhat worse 08/18/17; left lateral leg. We changed to [facility] Blue last week after a prolonged course of Iodoflex which helps get the surface better. It appears that the wound with is improved. Continue with difficult areas on the left dorsal first second and plantar first second on the right 09/01/17; patient arrives in clinic today having had a temperature of 103 yesterday. He was seen in the ER and [facility]. The patient was concerned he could have cellulitis again in the right leg however they diagnosed him with a UTI and he is now on Keflex. He has a history of cellulitis which is been recurrent and difficult but this is been in the left leg, in the past 5 use doxycycline. He does in and out catheterizations at home which are risk factors for UTI 09/08/17; patient will be completing his Keflex this weekend. The erythema on the left leg is considerably better. He has a new wound today on the medial part of the right leg small superficial almost looks like a skin tear. He has worsening of the area on the right dorsal first and second toe. His major area on the left lateral leg is better. Using Hydrofera Blue on all areas 09/15/17; gradual  reduction in width on the long wound in the left lateral leg. No debridement required. He also has wounds on the plantar aspect of his left first second toe web space and on the dorsal aspect of the right first second toe web space. 09/22/17; there continues to be very gradual improvements in the dimensions of the left lateral leg wound. He hasn't round erythematous spot with might be pressure on his wheelchair. There is no evidence obviously of infection no purulence no warmth He has a dry scaled area on the plantar aspect of the left first second toe Improved area on the dorsal right first second toe. 09/29/17; left lateral leg wound continues to improve in dimensions mostly with an is still a fairly long but increasingly narrow wound. He has a dry scaled area on the plantar aspect of his left first second toe web space Increasingly concerning area on the dorsal right first second toe. In fact I am concerned today about possible cellulitis around this wound. The areas extending up his second toe and although there is deformities here almost appears to abut on the nailbed. 10/06/17; left lateral leg wound continues to make very gradual progress. Tissue culture I did from the right first second toe dorsal foot last time grew MRSA and enterococcus which was vancomycin sensitive. This was not sensitive to clindamycin or doxycycline. He is allergic to Zyvox and sulfa we have therefore  arrange for him to have dalvance infusion tomorrow. He is had this in the past and tolerated it well 10/20/17; left lateral leg wound continues to make decent progress. This is certainly reduced in terms of with there is advancing epithelialization.The cellulitis in the right foot looks better although he still has a deep wound in the dorsal aspect of the first second toe web space. Plantar left first toe web space on the left I think is making some progress 10/27/17; left lateral leg wound continues to make decent  progress. Advancing epithelialization.using Hydrofera Blue The right first second toe web space wound is better-looking using silver alginate Improvement in the left plantar first second toe web space. Again using silver alginate 11/03/17 left lateral leg wound continues to make decent progress albeit slowly. Using [facility] The right per second toe web space continues to be a very problematic looking punched out wound. I obtained a piece of tissue for deep culture I did extensively treated this for fungus. It is difficult to imagine that this is a pressure area as the patient states other than going outside he doesn't really wear shoes at home The left plantar first second toe web space looked fairly senescent. Necrotic edges. This required debridement change to [facility] Blue to all wound areas 11/10/17; left lateral leg wound continues to contract. Using Hydrofera Blue On the right dorsal first second toe web space dorsally. Culture I did of this area last week grew MRSA there is not an easy oral option in this patient was multiple antibiotic allergies or intolerances. This was only a rare culture isolate I'm therefore going to use Bactroban under silver alginate On the left plantar first second toe web space. Debridement is required here. This is also unchanged 11/17/17; left lateral leg wound continues to contract using Hydrofera Blue this is no longer the major issue. The major concern here is the right first second toe web space. He now has an open area going from dorsally to the plantar aspect. There is now wound on the inner lateral part of the first toe. Not a very viable surface on this. There is erythema spreading medially into the forefoot. No major change in the left first second toe plantar wound 11/24/17; left lateral leg wound continues to contract using Hydrofera Blue. Nice improvement today The right first second toe web space all of this looks a lot less angry than last  week. I have given him clindamycin and topical Bactroban for MRSA and terbinafine for the possibility of underlining tinea pedis that I could not control with ketoconazole. Looks somewhat better The area on the plantar left first second toe web space is weeping with dried debris around the wound 12/01/17; left lateral leg wound continues to contract he Hydrofera Blue. It is becoming thinner in terms of with nevertheless it is making good improvement. The right first second toe web space looks less angry but still a large necrotic-looking wounds starting on the plantar aspect of the right foot extending between the toes and now extensively on the base of the right second toe. I gave him clindamycin and topical Bactroban for MRSA anterior benefiting for the possibility of underlying tinea pedis. Not looking better today The area on the left first/second toe looks better. Debrided of necrotic debris 12/05/17* the patient was worked in urgently today because over the weekend he found blood on his incontinence bad when he woke up. He was found to have an ulcer by his wife who does most  of his wound care. He came in today for [place] to look at this. He has not had a history of wounds in his buttocks in spite of his paraplegia. 12/08/17; seen in follow-up today at his usual appointment. He was seen earlier this week and found to have a new wound on his buttock. We also follow him for wounds on the left lateral leg, left first second toe web space and right first second toe web space 12/15/17; we have been using Hydrofera Blue to the left lateral leg which has improved. The right first second toe web space has also improved. Left first second toe web space plantar aspect looks stable. The left buttock has worsened using Santyl. Apparently the buttock has drainage 12/22/17; we have been using Hydrofera Blue to the left lateral leg which continues to improve now 2 small wounds separated by normal skin. He tells  [place] he had a fever up to 100 yesterday he is prone to UTIs but has not noted anything different. He does in and out catheterizations. The area between the first and second toes today does not look good necrotic surface covered with what looks to be purulent drainage and erythema extending into the third toe. I had gotten this to something that I thought look better last time however it is not look good today. He also has a necrotic surface over the buttock wound which is expanded. I thought there might be infection under here so I removed a lot of the surface with a #5 curet though nothing look like it really needed culturing. He is been using Santyl to this area 12/27/17; his original wound on the left lateral leg continues to improve using Hydrofera Blue. I gave him samples of Baxdella although he was unable to take them out of fear for an allergic reaction ["lump in his throat"].the culture I did of the purulent drainage from his second toe last week showed both enterococcus and a set Enterobacter I was also concerned about the erythema on the bottom of his foot although paradoxically although this looks somewhat better today. 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 We've been using silver alginate here. They came up today that he sleeps in a recliner, I'm not sure why but I asked him to stop this 01/03/18; his original wound we've been using Hydrofera Blue is now separated into 2 areas. Ulcer on his left buttock is better he is off the recliner and sleeping in bed Finally both wound areas between his first and second toes also looks some better 01/10/18; his original wound on the left lateral leg is now separated into 2 wounds we've been using Hydrofera Blue Ulcer on his left buttock has some drainage. There is a small probing site going into muscle layer superiorly.using silver alginate -He arrives  today with a deep tissue injury on the left heel The wound on the dorsal aspect of his first second toe on the left looks a lot betterusing silver alginate ketoconazole The area on the first second toe web space on the right also looks a lot bette 01/17/18; his original wound on the left lateral leg continues to progress using Hydrofera Blue Ulcer on his left buttock also is smaller surface healthier except for a small probing site going into the muscle layer superiorly. 2.4 cm of tunneling in this area DTI on his left heel we have only been offloading. Looks better  than last week no threatened open no evidence of infection the wound on the dorsal aspect of the first second toe on the left continues to look like it's regressing we have only been using silver alginate and terbinafine orally The area in the first second toe web space on the right also looks to be a lot better using silver alginate and terbinafine I think this was prompted by tinea pedis 01/31/18; the patient was hospitalized in [place] last week apparently for a complicated UTI. He was discharged on cefepime he does in and out catheterizations. In the hospital he was discovered M I don't mild elevation of AST and ALTs and the terbinafine was stopped.predictably the pressure ulcer on his buttock looks betterusing silver alginate. The area on the left lateral leg also is better using Hydrofera Blue. The area between the first and second toes on the left better. First and second toes on the right still substantial but better. Finally the DTI on the left heel has held together and looks like it's resolving 02/07/18-he is here in follow-up evaluation for multiple ulcerations. He has new injury to the lateral aspect of the last issue a pressure ulcer, he states this is from adhesive removal trauma. He states he has tried multiple adhesive products with no success. All other ulcers appear stable. The left heel DTI is resolving. We will  continue with same treatment plan and follow-up next week. 02/14/18; follow-up for multiple areas. He has a new area last week on the lateral aspect of his pressure ulcer more over the posterior trochanter. The original pressure ulcer looks quite stable has healthy granulation. 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 Almost fully epithelialized on the original superior area using Hydrofera Blue DTI on the left heel has peeled off this week to reveal a small superficial wound under denuded skin and subcutaneous tissue Both areas between the first and second toes look better including nothing open on the left 02/21/18; The patient's wounds on his left ischial tuberosity and posterior left greater trochanter actually looked better. He has a large area of irritation around the area which I think is contact dermatitis. I am doubtful that this is fungal His original wound on the left lateral calf continues to improve we have been using Hydrofera Blue There is no open area in the left first second toe web space although there is a lot of thick callus The DTI on the left heel required debridement today of necrotic surface eschar and subcutaneous tissue using silver alginate Finally the area on the right first second toe webspace continues to contract using silver alginate and ketoconazole 02/28/18 Left ischial tuberosity wounds look better using silver alginate. Original wound on the left calf only has one small open area left using Hydrofera Blue DTI on the left heel required debridement mostly removing skin from around this wound surface. Using silver alginate The areas on the right first/second toe web space using silver alginate and ketoconazole 03/08/18 on evaluation today patient appears to be doing decently well as best I can tell in regard to his wounds. This is the first time that I have  seen him as he generally is followed by Dr. Dellia Nims. With that being said none of his wounds appear to be infected he does have an area where there is some skin covering what appears to be a new wound on the left dorsal surface of  his great toe. This is right at the nail bed. With that being said I do believe that debrided away some of the excess skin can be of benefit in this regard. Otherwise he has been tolerating the dressing changes without complication. 03/14/18; patient arrives today with the multiplicity of wounds that we are following. He has not been systemically unwell Original wound on the left lateral calf now only has 2 small open areas we've been using Hydrofera Blue which should continue The deep tissue injury on the left heel requires debridement today. We've been using silver alginate The left first second toe and the right first second toe are both are reminiscence what I think was tinea pedis. Apparently some of the callus Surface between the toes was removed last week when it started draining. Purulent drainage coming from the wound on the ischial tuberosity on the left. 03/21/18-He is here in follow-up evaluation for multiple wounds. 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 He tolerated debridement. The only change to last week's recommendations is to discontinue antifungal cream between toes. He will follow-up next week 03/28/18; following up for multiple wounds;Concern this week is streaking redness and swelling in the right foot. He is going to need antibiotics for this. 03/31/18; follow-up for right foot cellulitis. Streaking redness and swelling in the right foot on 03/28/18. 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 I put him on clindamycin 300 mg every 6 and brought him in for a quick check. He has an open wound between his first and second toes on the right foot as a potential  source. 04/04/18; Right foot cellulitis is resolving he is completing clindamycin. This is truly good news Left lateral calf wound which is initial wound only has one small open area inferiorly this is close to healing out. He has compression stockings. We will use Hydrofera Blue right down to the epithelialization of this Nonviable surface on the left heel which was initially pressure with a DTI. We've been using Hydrofera Blue. I'm going to switch this back to silver alginate Left first second toe/tinea pedis this looks better using silver alginate Right first second toe tinea pedis using silver alginate Large pressure ulcers on theLeft ischial tuberosity. Small wound here Looks better. I am uncertain about the surface over the large wound. Using silver alginate 04/11/18; Cellulitis in the right foot is resolved Left lateral calf wound which was his original wounds still has 2 tiny open areas remaining this is just about closed Nonviable surface on the left heel is better but still requires debridement Left first second toe/tinea pedis still open using silver alginate Right first second toe wound tinea pedis I asked him to go back to using ketoconazole and silver alginate Large pressure ulcers on the left ischial tuberosity this shear injury here is resolved. Wound is smaller. No evidence of infection using silver alginate 04/18/18; Patient arrives with an intense area of cellulitis in the right mid lower calf extending into the right heel area. Bright red and warm. Smaller area on the left anterior leg. He has a significant history of MRSA. He will definitely need antibioticsdoxycycline He now has 2 open areas on the left ischial tuberosity the original large wound and now a satellite area which I think was above his initial satellite areas. Not a wonderful surface on this satellite area surrounding erythema which looks like pressure related. His left lateral calf wound again his original  wound is just about closed Left heel pressure injury still requiring debridement Left first second toe looks a lot better using silver alginate Right first second toe also using silver alginate and ketoconazole cream also looks better 04/20/18; the patient was worked in early today out of concerns with his cellulitis on the right leg. I had started him on doxycycline. This was 2 days ago. His wife was concerned about the swelling in the area. Also concerned about the left buttock. He has not been systemically unwell no fever chills. No nausea vomiting or diarrhea 04/25/18; the patient's left buttock wound is continued to deteriorate he is using Hydrofera Blue. He is still completing clindamycin for the cellulitis on the right leg although all of this looks better. 05/02/18 Left buttock wound still with a lot of drainage and a very tightly adherent fibrinous necrotic surface. He has a deeper area superiorly The left lateral calf wound is still closed DTI wound on the left heel necrotic surface especially the circumference using Iodoflex Areas between his left first second toe and right first second toe both look better. Dorsally and the right first second toe he had a necrotic surface although at smaller. In using silver alginate and ketoconazole. I did a culture last week which was a deep tissue culture of the reminiscence of the open wound on the right first second toe dorsally. This grew a few Acinetobacter and a few methicillin-resistant staph aureus. Nevertheless the area actually this week looked better. I didn't feel the need to specifically address this at least in terms of systemic antibiotics. 05/09/18; wounds are measuring larger more drainage per our intake. We are using Santyl covered with alginate on the large superficial buttock wounds, Iodosorb on the left heel, ketoconazole and silver alginate to the dorsal first and second toes bilaterally. 05/16/18; The area on his left buttock  better in some aspects although the area superiorly over the ischial tuberosity required an extensive debridement.using Santyl Left heel appears stable. Using Iodoflex The areas between his first and second toes are not bad however there is spreading erythema up the dorsal aspect of his left foot this looks like cellulitis again. He is insensate the erythema is really very brilliant.o Erysipelas He went to see an allergist days ago because he was itching part of this he had lab work done. This showed a white count of 15.1 with 70% neutrophils. Hemoglobin of 11.4 and a platelet count of 659,000. Last white count we had in Epic was a 2-1/2 years ago which was 25.9 but he was ill at the time. He was able to show me some lab work that was done by his primary physician the pattern is about the same. 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 But prompted me to go ahead and do x-rays of both feet and the pelvis rule out osteomyelitis. He also had a comprehensive metabolic panel this was reasonably normal his albumin was 3.7 liver function tests BUN/creatinine all normal 05/23/18; x-rays of both his feet from last week were negative for underlying pulmonary abnormality. The x-ray of his pelvis however showed mild irregularity in the left ischial which may represent some early osteomyelitis. The wound in the left ischial continues to get deeper clearly now exposed muscle. Each week necrotic surface material over this area. Whereas the rest of the wounds do not look so bad. The left ischial wound we have been using Santyl and calcium alginate To the  left heel surface necrotic debris using Iodoflex The left lateral leg is still healed Areas on the left dorsal foot and the right dorsal foot are about the same. There is some inflammation on the left which might represent contact dermatitis, fungal dermatitis I am doubtful cellulitis although this looks better  than last week 05/30/18; CT scan done at Hospital did not show any osteomyelitis or abscess. Suggested the possibility of underlying cellulitis although I don't see a lot of evidence of this at the bedside The wound itself on the left buttock/upper thigh actually looks somewhat better. No debridement Left heel also looks better no debridement continue Iodoflex Both dorsal first second toe spaces appear better using Lotrisone. Left still required debridement 06/06/18; Intake reported some purulent looking drainage from the left gluteal wound. Using Santyl and calcium alginate Left heel looks better although still a nonviable surface requiring debridement The left dorsal foot first/second webspace actually expanding and somewhat deeper. I may consider doing a shave biopsy of this area Right dorsal foot first/second webspace appears stable to improved. Using Lotrisone and silver alginate to both these areas 06/13/18 Left gluteal surface looks better. Now separated in the 2 wounds. No debridement required. Still drainage. We'll continue silver alginate Left heel continues to look better with Iodoflex continue this for at least another week Of his dorsal foot wounds the area on the left still has some depth although it looks better than last week. We've been using Lotrisone and silver alginate 06/20/18 Left gluteal continues to look better healthy tissue Left heel continues to look better healthy granulation wound is smaller. He is using Iodoflex and his long as this continues continue the Iodoflex Dorsal right foot looks better unfortunately dorsal left foot does not. There is swelling and erythema of his forefoot. He had minor trauma to this several days ago but doesn't think this was enough to have caused any tissue injury. Foot looks like cellulitis, we have had this problem before 06/27/18 on evaluation today patient appears to be doing a little worse in regard to his foot ulcer. 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 Both of which would really be his only options for treating this infection. In the past he has been given and effusion of Orbactiv. This is done very well for him in the past again it's one time dosing IV antibiotic therapy. Subsequently I do believe this is something we're gonna need to see about doing at this point in time. Currently his other wounds seem to be doing somewhat better in my pinion I'm pretty happy in that regard. 07/03/18 on evaluation today patient's wounds actually appear to be doing fairly well. He has been tolerating the dressing changes without complication. All in all he seems to be showing signs of improvement. In regard to the antibiotics he has been dealing with infectious disease since I saw him last week as far as getting this scheduled. In the end [facility]'s [facility] this coming Friday. In the meantime he has been continuing to perform the dressing changes in such as previous. There does not appear to be any evidence of infection worsengin at this time. 07/10/18; Since I last saw this man 2 weeks ago things have actually improved. IV antibiotics of resulted in less forefoot erythema although there  is still some present. He is not systemically unwell Left buttock wounds 2 now have no depth there is increased epithelialization Using silver alginate Left heel still requires debridement using Iodoflex Left dorsal foot still with a sizable wound about the size of a border but healthy granulation Right dorsal foot still with a slitlike area using silver alginate 07/18/18; the patient's cellulitis in the left foot is improved in fact I think it is on its way to resolving. Left buttock wounds 2 both look better although the larger one has hypertension  granulation we've been using silver alginate Left heel has some thick circumferential redundant skin over the wound edge which will need to be removed today we've been using Iodoflex Left dorsal foot is still a sizable wound required debridement using silver alginate The right dorsal foot is just about closed only a small open area remains here 07/25/18; left foot cellulitis is resolved Left buttock wounds 2 both look better. Hyper-granulation on the major area Left heel as some debris over the surface but otherwise looks a healthier wound. Using silver collagen Right dorsal foot is just about closed 07/31/18; arrives with our intake nurse worried about purulent drainage from the buttock. We had hyper-granulation here last week His buttock wounds 2 continue to look better Left heel some debris over the surface but measuring smaller. Right dorsal foot unfortunately has openings between the toes Left foot superficial wound looks less aggravated. 08/07/18 Buttock wounds continue to look better although some of her granulation and the larger medial wound. silver alginate Left heel continues to look a lot better.silver collagen Left foot superficial wound looks less stable. Requires debridement. He has a new wound superficial area on the foot on the lateral dorsal foot. Right foot looks better using silver alginate without Lotrisone 08/14/2018; patient was in the ER last week diagnosed with a UTI. He is now on Cefpodoxime and Macrodantin. Buttock wounds continued to be smaller. Using silver alginate Left heel continues to look better using silver collagen Left foot superficial wound looks as though it is improving Right dorsal foot area is just about healed. 08/21/2018; patient is completed his antibiotics for his UTI. He has 2 open areas on the buttocks. There is still not closed although the surface looks satisfactory. Using silver alginate Left heel continues to improve using silver  collagen The bilateral dorsal foot areas which are at the base of his first and second toes/possible tinea pedis are actually stable on the left but worse on the right. The area on the left required debridement of necrotic surface. After debridement I obtained a specimen for PCR culture. The right dorsal foot which is been just about healed last week is now reopened 08/28/2018; culture done on the left dorsal foot showed coag negative staph both staph epidermidis and Lugdunensis. I think this is worthwhile initiating systemic treatment. I will use doxycycline given his long list of allergies. The area on the left heel slightly improved but still requiring debridement. The large wound on the buttock is just about closed whereas the smaller one is larger. Using silver alginate in this area 09/04/2018; patient is completing his doxycycline for the left foot although this continues to be a very difficult wound area with very adherent necrotic debris. We are using silver alginate to all his wounds right foot left foot and the small wounds on his buttock, silver collagen on the left heel. 09/11/2018; once again this patient has intense erythema and swelling of the left forefoot. Lesser degrees  of erythema in the right foot. He has a long list of allergies and intolerances. I will reinstitute doxycycline. 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 Using silver alginate Left heel also looks better using silver collagen Unfortunately both the areas on his feet look worse. The area on the left first second webspace is now gone through to the plantar part of his foot. The area on the left foot anteriorly is irritated with erythema and swelling in the forefoot. 09/25/2018 His wound on the left plantar heel looks better. Using silver collagen The area on the left buttock 2 small remnant areas. One is closed one is still open. Using silver alginate The areas  between both his first and second toes look worse. This in spite of long-standing antifungal therapy with ketoconazole and silver alginate which should have antifungal activity He has small areas around his original wound on the left calf one is on the bottom of the original scar tissue and one superiorly both of these are small and superficial but again given wound history in this site this is worrisome 10/02/2018 Left plantar heel continues to gradually contract using silver collagen Left buttock wound is unchanged using silver alginate The areas on his dorsal feet between his first and second toes bilaterally look about the same. I prescribed clindamycin ointment to see if we can address chronic staph colonization and also the underlying possibility of erythrasma The left lateral lower extremity wound is actually on the lateral part of his ankle. Small open area here. We have been using silver alginate 10/09/2018; Left plantar heel continues to look healthy and contract. No debridement is required Left buttock slightly smaller with a tape injury wound just below which was new this week Dorsal feet somewhat improved I have been using clindamycin Left lateral looks lower extremity the actual open area looks worse although a lot of this is epithelialized. I am going to change to silver collagen today He has a lot more swelling in the right leg although this is not pitting not red and not particularly warm there is a lot of spasm in the right leg usually indicative of people with paralysis of some underlying discomfort. We have reviewed his vascular status from 2017 he had a left greater saphenous vein ablation. I wonder about referring him back to vascular surgery if the area on the left leg continues to deteriorate. 10/16/2018 in today for follow-up and management of multiple lower extremity ulcers. His left Buttock wound is much lower smaller and almost closed completely. The wound to the  left ankle has began to reopen with Epithelialization and some adherent slough. He has multiple new areas to the left foot and leg. The left dorsal foot without much improvement. Wound present between left great webspace and 2nd toe. Erythema and edema present right leg. Right LE ultrasound obtained on 10/10/18 was negative for DVT. 10/23/2018; Left buttock is closed over. Still dry macerated skin but there is no open wound. I suspect this is chronic pressure/moisture Left lateral calf is quite a bit worse than when I saw this last. There is clearly drainage here he has macerated skin into the left plantar heel. We will change the primary dressing to alginate Left dorsal foot has some improvement in overall wound area. Still using clindamycin and silver alginate Right dorsal foot about the same as the left using clindamycin and silver alginate The erythema in the right leg has resolved.  He is DVT rule out was negative Left heel pressure area required debridement although the wound is smaller and the surface is health 10/26/2018 The patient came back in for his nurse check today predominantly because of the drainage coming out of the left lateral leg with a recent reopening of his original wound on the left lateral calf. He comes in today with a large amount of surrounding erythema around the wound extending from the calf into the ankle and even in the area on the dorsal foot. He is not systemically unwell. He is not febrile. Nevertheless this looks like cellulitis. We have been using silver alginate to the area. I changed him to a regular visit and I am going to prescribe him doxycycline. The rationale here is a long list of medication intolerances and a history of MRSA. I did not see anything that I thought would provide a valuable culture 10/30/2018 Follow-up from his appointment 4 days ago with really an extensive area of cellulitis in the left calf left lateral ankle and left dorsal foot. I  put him on doxycycline. He has a long list of medication allergies which are true allergy reactions. 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 In any case he is a lot better today. The erythema especially in the anterior and lateral left calf is better. He still has left ankle erythema. He also is complaining about increasing edema in the right leg we have only been using Kerlix Coban and he has been doing the wraps at home. Finally he has a spotty rash on the medial part of his upper left calf which looks like folliculitis or perhaps wrap occlusion type injury. Small superficial macules not pustules 11/06/18 patient arrives today with again a considerable degree of erythema around the wound on the left lateral calf extending into the dorsal ankle and dorsal foot. This is a lot worse than when I saw this last week. He is on doxycycline really with not a lot of improvement. He has not been systemically unwell Wounds on the; left heel actually looks improved. Original area on the left foot and proximity to the first and second toes looks about the same. He has superficial areas on the dorsal foot, anterior calf and then the reopening of his original wound on the left lateral calf which looks about the same The only area he has on the right is the dorsal webspace first and second which is smaller. He has a large area of dry erythematous skin on the left buttock small open area here. 11/13/2018; the patient arrives in much better condition. The erythema around the wound on the left lateral calf is a lot better. Not sure whether this was the clindamycin or the TCA and ketoconazole or just in the improvement in edema control [stasis dermatitis]. In any case this is a lot better. The area on the left heel is very small and just about resolved using silver collagen we have been using silver alginate to the areas on his dorsal  feet 11/20/2018; his wounds include the left lateral calf, left heel, dorsal aspects of both feet just proximal to the first second webspace. He is stable to slightly improved. I did not think any changes to his dressings were going to be necessary 11/27/2018 he has a reopening on the left buttock which is surrounded by what looks like tinea or perhaps some other form of dermatitis. The area on the left  dorsal foot has some erythema around it I have marked this area but I am not sure whether this is cellulitis or not. Left heel is not closed. Left calf the reopening is really slightly longer and probably worse 1/13; in general things look better and smaller except for the left dorsal foot. Area on the left heel is just about closed, left buttock looks better only a small wound remains in the skin looks better [using Lotrisone] 1/20; the area on the left heel only has a few remaining open areas here. Left lateral calf about the same in terms of size, left dorsal foot slightly larger right lateral foot still not closed. The area on the left buttock has no open wound and the surrounding skin looks a lot better 1/27; the area on the left heel is closed. Left lateral calf better but still requiring extensive debridements. The area on his left buttock is closed. He still has the open areas on the left dorsal foot which is slightly smaller in the right foot which is slightly expanded. We have been using Iodoflex on these areas as well 2/3; left heel is closed. Left lateral calf still requiring debridement using Iodoflex there is no open area on his left buttock however he has dry scaly skin over a large area of this. Not really responding well to the Lotrisone. Finally the areas on his dorsal feet at the level of the first second webspace are slightly smaller on the right and about the same on the left. Both of these vigorously debrided with Anasept and gauze 2/10; left heel remains closed he has dry  erythematous skin over the left buttock but there is no open wound here. Left lateral leg has come in and with. Still requiring debridement we have been using Iodoflex here. Finally the area on the left dorsal foot and right dorsal foot are really about the same extremely dry callused fissured areas. He does not yet have a dermatology appointment 2/17; left heel remains closed. He has a new open area on the left buttock. The area on the left lateral calf is bigger longer and still covered in necrotic debris. No major change in his foot areas bilaterally. I am awaiting for a dermatologist to look on this. We have been using ketoconazole I do not know that this is been doing any good at all. 2/24; left heel remains closed. The left buttock wound that was new reopening last week looks better. The left lateral calf appears better also although still requires debridement. The major area on his foot is the left first second also requiring debridement. We have been putting Prisma on all wounds. I do not believe that the ketoconazole has done too much good for his feet. He will use Lotrisone I am going to give him a 2-week course of terbinafine. We still do not have a dermatology appointment 3/2 left heel remains closed however there is skin over bone in this area I pointed this out to him today. The left buttock wound is epithelialized but still does not look completely stable. The area on the left leg required debridement were using silver collagen here. With regards to his feet we changed to Lotrisone last week and silver alginate. 3/9; left heel remains closed. Left buttock remains closed. The area on the right foot is essentially closed. The left foot remains unchanged. Slightly smaller on the left lateral calf. Using silver collagen to both of these areas 3/16-Left heel remains closed. Area on right foot  is closed. Left lateral calf above the lateral malleolus open wound requiring debridement with  easy bleeding. Left dorsal wound proximal to first toe also debrided. Left ischial area open new. Patient has been using Prisma with wrapping every 3 days. Dermatology appointment is apparently tomorrow.Patient has completed his terbinafine 2-week course with some apparent improvement according to him, there is still flaking and dry skin in his foot on the left 3/23; area on the right foot is reopened. The area on the left anterior foot is about the same still a very necrotic adherent surface. He still has the area on the left leg and reopening is on the left buttock. He apparently saw dermatology although I do not have a note. According to the patient who is usually fairly well informed they did not have any good ideas. Put him on oral terbinafine which she is been on before. 3/30; using silver collagen to all wounds. Apparently his dermatologist put him on doxycycline and rifampin presumably some culture grew staph. I do not have this result. He remains on terbinafine although I have used terbinafine on him before 4/6; patient has had a fairly substantial reopening on the right foot between the first and second toes. He is finished his terbinafine and I believe is on doxycycline and rifampin still as prescribed by dermatology. We have been using silver collagen to all his wounds although the patient reports that he thinks silver alginate does better on the wounds on his buttock. 4/13; the area on his left lateral calf about the same size but it did not require debridement. Left dorsal foot just proximal to the webspace between the first and second toes is about the same. Still nonviable surface. I note some superficial bronze discoloration of the dorsal part of his foot Right dorsal foot just proximal to the first and second toes also looks about the same. I still think there may be the same discoloration I noted above on the left Left buttock wound looks about the same 4/20; left lateral calf  appears to be gradually contracting using silver collagen. 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 The left dorsal foot wound is debrided of tightly adherent necrotic debris and really cleans up quite nicely. The right area is worse with expansion. I did not debride this it is now over the base of the second toe The area on his left buttock is smaller no debridement is required using silver collagen 5/4; left calf continues to make good progress. He arrives with erythema around the wounds on his dorsal foot which even extends to the plantar aspect. Very concerning for coexistent infection. He is finished the erythromycin I gave him for possible erythrasma this does not seem to have helped. The area on the left foot is about the same base of the dorsal toes Is area on the buttock looks improved on the left 5/11; left calf and left buttock continued to make good progress. Left foot is about the same to slightly improved. Major problem is on the right foot. He has not had an x-ray. Deep tissue culture I did last week showed both Enterobacter and [name]. coli. I did not change the doxycycline I put him on empirically although neither 1 of these were plated to doxycycline. He arrives today with the erythema looking worse on both the dorsal and plantar foot. Macerated skin on the bottom of the foot. he has not been systemically unwell 5/18-Patient returns at 1 week, left  calf wound appears to be making some progress, left buttock wound appears slightly worse than last time, left foot wound looks slightly better, right foot redness is marginally better. X-ray of both feet show no air or evidence of osteomyelitis. Patient is finished his Omnicef and terbinafine. He continues to have macerated skin on the bottom of the left foot as well as right 5/26; left calf wound is better, left buttock wound appears to have multiple small superficial open areas  with surrounding macerated skin. X-rays that I did last time showed no evidence of osteomyelitis in either foot. He is finished cefdinir and doxycycline. I do not think that he was on terbinafine. He continues to have a large superficial open area on the right foot anterior dorsal and slightly between the first and second toes. I did send him to dermatology 2 months ago or so wondering about whether they would do a fungal scraping. I do not believe they did but did do a culture. We have been using silver alginate to the toe areas, he has been using antifungals at home topically either ketoconazole or Lotrisone. We are using silver collagen on the left foot, silver alginate on the right, silver collagen on the left lateral leg and silver alginate on the left buttock 6/1; left buttock area is healed. We have the left dorsal foot, left lateral leg and right dorsal foot. We are using silver alginate to the areas on both feet and silver collagen to the area on his left lateral calf 6/8; the left buttock apparently reopened late last week. He is not really sure how this happened. He is tolerating the terbinafine. Using silver alginate to all wounds 6/15; left buttock wound is larger than last week but still superficial. Came in the clinic today with a report of purulence from the left lateral leg I did not identify any infection Both areas on his dorsal feet appear to be better. He is tolerating the terbinafine. Using silver alginate to all wounds 6/22; left buttock is about the same this week, left calf quite a bit better. His left foot is about the same however he comes in with erythema and warmth in the right forefoot once again. Culture that I gave him in the beginning of May showed Enterobacter and [name]. coli. I gave him doxycycline and things seem to improve although neither 1 of these organisms was specifically plated. 6/29; left buttock is larger and dry this week. Left lateral calf looks to me to  be improved. Left dorsal foot also somewhat improved right foot completely unchanged. The erythema on the right foot is still present. He is completing the Ceftin dinner that I gave him empirically [see discussion above.) 7/6 - All wounds look to be stable and perhaps improved, the left buttock wound is slightly smaller, per patient bleeds easily, completed ceftin, the right foot redness is less, he is on terbinafine 7/13; left buttock wound about the same perhaps slightly narrower. Area on the left lateral leg continues to narrow. Left dorsal foot slightly smaller right foot about the same. We are using silver alginate on the right foot and Hydrofera Blue to the areas on the left. Unna boot on the left 2 layer compression on the right 7/20; left buttock wound absolutely the same. Area on lateral leg continues to get better. Left dorsal foot require debridement as did the right no major change in the 7/27; left buttock wound the same size necrotic debris over the surface. The area on the  lateral leg is closed once again. His left foot looks better right foot about the same although there is some involvement now of the posterior first second toe area. He is still on terbinafine which I have given him for a month, not certain a centimeter major change 06/25/19-All wounds appear to be slightly improved according to report, left buttock wound looks clean, both foot wounds have minimal to no debris the right dorsal foot has minimal slough. We are using Hydrofera Blue to the left and silver alginate to the right foot and ischial wound. 8/10-Wounds all appear to be around the same, the right forefoot distal part has some redness which was not there before, however the wound looks clean and small. Ischial wound looks about the same with no changes 8/17; his wound on the left lateral calf which was his original chronic venous insufficiency wound remains closed. Since I last saw him the areas on the left  dorsal foot right dorsal foot generally appear better but require debridement. The area on his left initial tuberosity appears somewhat larger to me perhaps hyper granulated and bleeds very easily. We have been using Hydrofera Blue to the left dorsal foot and silver alginate to everything else 8/24; left lateral calf remains closed. The areas on his dorsal feet on the webspace of the first and second toes bilaterally both look better. The area on the left buttock which is the pressure ulcer stage II slightly smaller. I change the dressing to Hydrofera Blue to all areas 8/31; left lateral calf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 the warmth and swelling is improved. I am hopeful that the cellulitis part of this control. I wouldn't be surprised if there is an element of venous inflammation as well. 01/17/17. The erythema is present but better in the left leg. His left lateral leg wound still does not have a viable surface buttons certain parts of this long thin wound it appears like there has been improvement in dimensions. 01/20/17; the erythema still present but much better in the  left leg. I'm thinking this is his usual degree of chronic venous inflammation. The wound on the left leg looks somewhat better. Is less surface slough 01/27/17; erythema is back to the chronic venous inflammation. The wound on the left leg is somewhat better. I am back to the point where I like to try an Apligraf once again 02/10/17; slight improvement in wound dimensions. Apligraf #2. He is completing his doxycycline 02/14/17; patient arrives today having completed doxycycline last Thursday. This was supposed to be a nurse visit however once again he hasn't tense erythema from the medial part of his wound extending over the lower leg. Also erythema in his foot this is roughly in the same distribution as last time. He has baseline chronic venous inflammation however this is a lot worse than the baseline I have learned to accept the on him is baseline inflammation 02/24/17- patient is here for follow-up evaluation. He is tolerating compression therapy. His voicing no complaints or concerns he is here anticipating an Apligraf 03/03/17; he arrives today with an adherent necrotic surface. I don't think this is surface is going to be amenable for Apligraf's. The erythema around his wound and on the left dorsal foot has resolved he is off antibiotics 03/10/17; better-looking surface today. I don't think he can tolerate Apligraf's. He tells me he had a wound VAC after a skin graft years ago to this area and they had difficulty with a seal. The erythema continues to be stable around this some degree of chronic venous inflammation but he also has recurrent cellulitis. We have been using Iodoflex 03/17/17; continued improvement in the surface and may be small changes in dimensions. Using Iodoflex which seems the only thing that will control his surface 03/24/17- He is here for follow up evaluation of his LLE lateral ulceration and ulcer to right dorsal foot/toe space. He is voicing no complaints or concerns, He is  tolerating compression wrap. 03/31/17 arrives today with a much healthier looking wound on the left lower extremity. We have been using Iodoflex for a prolonged period of time which has for the first time prepared and adequate looking wound bed although we have not had much in the way of wound dimension improvement. He also has a small wound between the first and second toe on the right 04/07/17; arrives today with a healthy-looking wound bed and at least the top 50% of this wound appears to be now her. No debridement was required I have changed him to [facility] last week after prolonged Iodoflex. He did not do well with Apligraf's. We've had a re-opening between the first and second toe on the right 04/14/17; arrives today with a healthier looking wound bed contractions and the top 50% of this wound and some on the lesser 50%. Wound bed appears healthy. The area between the first and second toe on the right still remains problematic 04/21/17; continued very gradual improvement. Using [facility] 04/28/17; continued very gradual improvement in the left lateral leg venous insufficiency wound. His periwound erythema  is very mild. We have been using Hydrofera Blue. Wound is making progress especially in the superior 50% 05/05/17; he continues to have very gradual improvement in the left lateral venous insufficiency wound. Both in terms with an length rings are improving. I debrided this every 2 weeks with #5 curet and we have been using Hydrofera Blue and again making good progress With regards to the wounds between his right first and second toe which I thought might of been tinea pedis he is not making as much progress very dry scaly skin over the area. Also the area at the base of the left first and second toe in a similar condition 05/12/17; continued gradual improvement in the refractory left lateral venous insufficiency wound on the left. Dimension smaller. Surface still requiring  debridement using Hydrofera Blue 05/19/17; continued gradual improvement in the refractory left lateral venous ulceration. Careful inspection of the wound bed underlying rumination suggested some degree of epithelialization over the surface no debridement indicated. Continue Hydrofera Blue difficult areas between his toes first and third on the left than first and second on the right. I'm going to change to silver alginate from silver collagen. Continue ketoconazole as I suspect underlying tinea pedis 05/26/17; left lateral leg venous insufficiency wound. We've been using Hydrofera Blue. I believe that there is expanding epithelialization over the surface of the wound albeit not coming from the wound circumference. This is a bit of an odd situation in which the epithelialization seems to be coming from the surface of the wound rather than in the exact circumference. There is still small open areas mostly along the lateral margin of the wound. ooHe has unchanged areas between the left first and second and the right first second toes which I been treating for tenia pedis 06/02/17; left lateral leg venous insufficiency wound. We have been using Hydrofera Blue. Somewhat smaller from the wound circumference. The surface of the wound remains a bit on it almost epithelialized sedation in appearance. I use an open curette today debridement in the surface of all of this especially the edges ooSmall open wounds remaining on the dorsal right first and second toe interspace and the plantar left first second toe and her face on the left 06/09/17; wound on the left lateral leg continues to be smaller but very gradual and very dry surface using Hydrofera Blue 06/16/17 requires weekly debridements now on the left lateral leg although this continues to contract. I changed to silver collagen last week because of dryness of the wound bed. Using Iodoflex to the areas on his first and second toes/web space  bilaterally 06/24/17; patient with history of paraplegia also chronic venous insufficiency with lymphedema. Has a very difficult wound on the left lateral leg. This has been gradually reducing in terms of with but comes in with a very dry adherent surface. High switch to silver collagen a week or so ago with hydrogel to keep the area moist. This is been refractory to multiple dressing attempts. He also has areas in his first and second toes bilaterally in the anterior and posterior web space. I had been using Iodoflex here after a prolonged course of silver alginate with ketoconazole was ineffective [question tinea pedis] 07/14/17; patient arrives today with a very difficult adherent material over his left lateral lower leg wound. He also has surrounding erythema and poorly controlled edema. He was switched his Santyl last visit which the nurses are applying once during his doctor visit and once on a nurse visit. He was  also reduced to 2 layer compression I'm not exactly sure of the issue here. 07/21/17; better surface today after 1 week of Iodoflex. Significant cellulitis that we treated last week also better. [Doxycycline] 07/28/17 better surface today with now 2 weeks of Iodoflex. Significant cellulitis treated with doxycycline. He has now completed the doxycycline and he is back to his usual degree of chronic venous inflammation/stasis dermatitis. He reminds me he has had ablations surgery here 08/04/17; continued improvement with Iodoflex to the left lateral leg wound in terms of the surface of the wound although the dimensions are better. 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 Problematic areas on the plantar aspect of the first second toe web space on the left and the dorsal aspect of the first second toe web space on the right. At one point I felt these were probably related to chronic fungal infections in treated him  aggressively for this although we have not made any improvement here. 08/11/17; left lateral leg. Surface continues to improve with the Iodoflex although we are not seeing much improvement in overall wound dimensions. Areas on his plantar left foot and right foot show no improvement. In fact the right foot looks somewhat worse 08/18/17; left lateral leg. We changed to [facility] Blue last week after a prolonged course of Iodoflex which helps get the surface better. It appears that the wound with is improved. Continue with difficult areas on the left dorsal first second and plantar first second on the right 09/01/17; patient arrives in clinic today having had a temperature of 103 yesterday. He was seen in the ER and [facility]. The patient was concerned he could have cellulitis again in the right leg however they diagnosed him with a UTI and he is now on Keflex. He has a history of cellulitis which is been recurrent and difficult but this is been in the left leg, in the past 5 use doxycycline. He does in and out catheterizations at home which are risk factors for UTI 09/08/17; patient will be completing his Keflex this weekend. The erythema on the left leg is considerably better. He has a new wound today on the medial part of the right leg small superficial almost looks like a skin tear. He has worsening of the area on the right dorsal first and second toe. His major area on the left lateral leg is better. Using Hydrofera Blue on all areas 09/15/17; gradual reduction in width on the long wound in the left lateral leg. No debridement required. He also has wounds on the plantar aspect of his left first second toe web space and on the dorsal aspect of the right first second toe web space. 09/22/17; there continues to be very gradual improvements in the dimensions of the left lateral leg wound. He hasn't round erythematous spot with might be pressure on his wheelchair. There is no evidence obviously of  infection no purulence no warmth ooHe has a dry scaled area on the plantar aspect of the left first second toe ooImproved area on the dorsal right first second toe. 09/29/17; left lateral leg wound continues to improve in dimensions mostly with an is still a fairly long but increasingly narrow wound. ooHe has a dry scaled area on the plantar aspect of his left first second toe web space ooIncreasingly concerning area on the dorsal right first second toe. In fact I am concerned today about possible cellulitis around this wound. The areas extending up  his second toe and although there is deformities here almost appears to abut on the nailbed. 10/06/17; left lateral leg wound continues to make very gradual progress. Tissue culture I did from the right first second toe dorsal foot last time grew MRSA and enterococcus which was vancomycin sensitive. This was not sensitive to clindamycin or doxycycline. He is allergic to Zyvox and sulfa we have therefore arrange for him to have dalvance infusion tomorrow. He is had this in the past and tolerated it well 10/20/17; left lateral leg wound continues to make decent progress. This is certainly reduced in terms of with there is advancing epithelialization.ooThe cellulitis in the right foot looks better although he still has a deep wound in the dorsal aspect of the first second toe web space. Plantar left first toe web space on the left I think is making some progress 10/27/17; left lateral leg wound continues to make decent progress. Advancing epithelialization.using Hydrofera Blue ooThe right first second toe web space wound is better-looking using silver alginate ooImprovement in the left plantar first second toe web space. Again using silver alginate 11/03/17 left lateral leg wound continues to make decent progress albeit slowly. Using Hydrofera Blue ooThe right per second toe web space continues to be a very problematic looking punched out wound.  I obtained a piece of tissue for deep culture I did extensively treated this for fungus. It is difficult to imagine that this is a pressure area as the patient states other than going outside he doesn't really wear shoes at home ooThe left plantar first second toe web space looked fairly senescent. Necrotic edges. This required debridement oochange to Hydrofera Blue to all wound areas 11/10/17; left lateral leg wound continues to contract. Using Hydrofera Blue ooOn the right dorsal first second toe web space dorsally. Culture I did of this area last week grew MRSA there is not an easy oral option in this patient was multiple antibiotic allergies or intolerances. This was only a rare culture isolate I'm therefore going to use Bactroban under silver alginate ooOn the left plantar first second toe web space. Debridement is required here. This is also unchanged 11/17/17; left lateral leg wound continues to contract using Hydrofera Blue this is no longer the major issue. ooThe major concern here is the right first second toe web space. He now has an open area going from dorsally to the plantar aspect. There is now wound on the inner lateral part of the first toe. Not a very viable surface on this. There is erythema spreading medially into the forefoot. ooNo major change in the left first second toe plantar wound 11/24/17; left lateral leg wound continues to contract using Hydrofera Blue. Nice improvement today ooThe right first second toe web space all of this looks a lot less angry than last week. I have given him clindamycin and topical Bactroban for MRSA and terbinafine for the possibility of underlining tinea pedis that I could not control with ketoconazole. Looks somewhat better ooThe area on the plantar left first second toe web space is weeping with dried debris around the wound 12/01/17; left lateral leg wound continues to contract he Hydrofera Blue. It is becoming thinner in terms of  with nevertheless it is making good improvement. ooThe right first second toe web space looks less angry but still a large necrotic-looking wounds starting on the plantar aspect of the right foot extending between the toes and now extensively on the base of the right second toe. I gave him clindamycin  and topical Bactroban for MRSA anterior benefiting for the possibility of underlying tinea pedis. Not looking better today ooThe area on the left first/second toe looks better. Debrided of necrotic debris 12/05/17* the patient was worked in urgently today because over the weekend he found blood on his incontinence bad when he woke up. He was found to have an ulcer by his wife who does most of his wound care. He came in today for [place] to look at this. He has not had a history of wounds in his buttocks in spite of his paraplegia. 12/08/17; seen in follow-up today at his usual appointment. He was seen earlier this week and found to have a new wound on his buttock. We also follow him for wounds on the left lateral leg, left first second toe web space and right first second toe web space 12/15/17; we have been using Hydrofera Blue to the left lateral leg which has improved. The right first second toe web space has also improved. Left first second toe web space plantar aspect looks stable. The left buttock has worsened using Santyl. Apparently the buttock has drainage 12/22/17; we have been using Hydrofera Blue to the left lateral leg which continues to improve now 2 small wounds separated by normal skin. He tells [place] he had a fever up to 100 yesterday he is prone to UTIs but has not noted anything different. He does in and out catheterizations. The area between the first and second toes today does not look good necrotic surface covered with what looks to be purulent drainage and erythema extending into the third toe. I had gotten this to something that I thought look better last time however it is not look  good today. He also has a necrotic surface over the buttock wound which is expanded. I thought there might be infection under here so I removed a lot of the surface with a #5 curet though nothing look like it really needed culturing. He is been using Santyl to this area 12/27/17; his original wound on the left lateral leg continues to improve using Hydrofera Blue. I gave him samples of Baxdella although he was unable to take them out of fear for an allergic reaction ["lump in his throat"].the culture I did of the purulent drainage from his second toe last week showed both enterococcus and a set Enterobacter I was also concerned about the erythema on the bottom of his foot although paradoxically although this looks somewhat better today. 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 We've been using silver alginate here. They came up today that he sleeps in a recliner, I'm not sure why but I asked him to stop this 01/03/18; his original wound we've been using Hydrofera Blue is now separated into 2 areas. ooUlcer on his left buttock is better he is off the recliner and sleeping in bed ooFinally both wound areas between his first and second toes also looks some better 01/10/18; his original wound on the left lateral leg is now separated into 2 wounds we've been using Hydrofera Blue ooUlcer on his left buttock has some drainage. There is a small probing site going into muscle layer superiorly.using silver alginate -He arrives today with a deep tissue injury on the left heel ooThe wound on the dorsal aspect of his first second toe on the left looks a lot betterusing silver alginate ketoconazole ooThe area on the first second toe  web space on the right also looks a lot bette 01/17/18; his original wound on the left lateral leg continues to progress using Hydrofera Blue ooUlcer on his left buttock also is  smaller surface healthier except for a small probing site going into the muscle layer superiorly. 2.4 cm of tunneling in this area ooDTI on his left heel we have only been offloading. Looks better than last week no threatened open no evidence of infection oothe wound on the dorsal aspect of the first second toe on the left continues to look like it's regressing we have only been using silver alginate and terbinafine orally ooThe area in the first second toe web space on the right also looks to be a lot better using silver alginate and terbinafine I think this was prompted by tinea pedis 01/31/18; the patient was hospitalized in [place] last week apparently for a complicated UTI. He was discharged on cefepime he does in and out catheterizations. In the hospital he was discovered M I don't mild elevation of AST and ALTs and the terbinafine was stopped.predictably the pressure ulcer on his buttock looks betterusing silver alginate. The area on the left lateral leg also is better using Hydrofera Blue. The area between the first and second toes on the left better. First and second toes on the right still substantial but better. Finally the DTI on the left heel has held together and looks like it's resolving 02/07/18-he is here in follow-up evaluation for multiple ulcerations. He has new injury to the lateral aspect of the last issue a pressure ulcer, he states this is from adhesive removal trauma. He states he has tried multiple adhesive products with no success. All other ulcers appear stable. The left heel DTI is resolving. We will continue with same treatment plan and follow-up next week. 02/14/18; follow-up for multiple areas. ooHe has a new area last week on the lateral aspect of his pressure ulcer more over the posterior trochanter. The original pressure ulcer looks quite stable has healthy granulation. 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 Almost fully epithelialized on the original superior area using Hydrofera Blue ooDTI on the left heel has peeled off this week to reveal a small superficial wound under denuded skin and subcutaneous tissue ooBoth areas between the first and second toes look better including nothing open on the left 02/21/18; ooThe patient's wounds on his left ischial tuberosity and posterior left greater trochanter actually looked better. He has a large area of irritation around the area which I think is contact dermatitis. I am doubtful that this is fungal ooHis original wound on the left lateral calf continues to improve we have been using Hydrofera Blue ooThere is no open area in the left first second toe web space although there is a lot of thick callus ooThe DTI on the left heel required debridement today of necrotic surface eschar and subcutaneous tissue using silver alginate ooFinally the area on the right first second toe webspace continues to contract using silver alginate and ketoconazole 02/28/18 ooLeft ischial tuberosity wounds look better using silver alginate. ooOriginal wound on the left calf only has one small open area left using Hydrofera Blue ooDTI on the left heel required debridement mostly removing skin from around this wound surface. Using silver alginate ooThe areas on the right first/second toe web space using silver alginate and ketoconazole 03/08/18 on evaluation today patient appears to be doing  decently well as best I can tell in regard to his wounds. This is the first time that I have seen him as he generally is followed by Dr. Dellia Nims. With that being said none of his wounds appear to be infected he does have an area where there is some skin covering what appears to be a new wound on the left dorsal surface of his great toe. This is right at the nail bed. With that being said I do believe that debrided away some of  the excess skin can be of benefit in this regard. Otherwise he has been tolerating the dressing changes without complication. 03/14/18; patient arrives today with the multiplicity of wounds that we are following. He has not been systemically unwell ooOriginal wound on the left lateral calf now only has 2 small open areas we've been using Hydrofera Blue which should continue ooThe deep tissue injury on the left heel requires debridement today. We've been using silver alginate ooThe left first second toe and the right first second toe are both are reminiscence what I think was tinea pedis. Apparently some of the callus Surface between the toes was removed last week when it started draining. ooPurulent drainage coming from the wound on the ischial tuberosity on the left. 03/21/18-He is here in follow-up evaluation for multiple wounds. 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 He tolerated debridement. The only change to last week's recommendations is to discontinue antifungal cream between toes. He will follow-up next week 03/28/18; following up for multiple wounds;Concern this week is streaking redness and swelling in the right foot. He is going to need antibiotics for this. 03/31/18; follow-up for right foot cellulitis. Streaking redness and swelling in the right foot on 03/28/18. 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 I put him on clindamycin 300 mg every 6 and brought him in for a quick check. He has an open wound between his first and second toes on the right foot as a potential source. 04/04/18; ooRight foot cellulitis is resolving he is completing clindamycin. This is truly good news ooLeft lateral calf wound which is initial wound only has one small open area inferiorly this is close to healing out. He has compression stockings. We will use Hydrofera Blue right down to the epithelialization  of this ooNonviable surface on the left heel which was initially pressure with a DTI. We've been using Hydrofera Blue. I'm going to switch this back to silver alginate ooLeft first second toe/tinea pedis this looks better using silver alginate ooRight first second toe tinea pedis using silver alginate ooLarge pressure ulcers on theLeft ischial tuberosity. Small wound here Looks better. I am uncertain about the surface over the large wound. Using silver alginate 04/11/18; ooCellulitis in the right foot is resolved ooLeft lateral calf wound which was his original wounds still has 2 tiny open areas remaining this is just about closed ooNonviable surface on the left heel is better but still requires debridement ooLeft first second toe/tinea pedis still open using silver alginate ooRight first second toe wound tinea pedis I asked him to go back to using ketoconazole and silver alginate ooLarge pressure ulcers on the left ischial tuberosity this shear injury here is resolved. Wound is smaller. No evidence of infection using silver alginate 04/18/18; ooPatient arrives with an intense area of cellulitis in the right mid lower calf extending into the right heel area. Bright red and warm. Smaller area on the left  anterior leg. He has a significant history of MRSA. He will definitely need antibioticsoodoxycycline ooHe now has 2 open areas on the left ischial tuberosity the original large wound and now a satellite area which I think was above his initial satellite areas. Not a wonderful surface on this satellite area surrounding erythema which looks like pressure related. ooHis left lateral calf wound again his original wound is just about closed ooLeft heel pressure injury still requiring debridement ooLeft first second toe looks a lot better using silver alginate ooRight first second toe also using silver alginate and ketoconazole cream also looks better 04/20/18; the patient was worked  in early today out of concerns with his cellulitis on the right leg. I had started him on doxycycline. This was 2 days ago. His wife was concerned about the swelling in the area. Also concerned about the left buttock. He has not been systemically unwell no fever chills. No nausea vomiting or diarrhea 04/25/18; the patient's left buttock wound is continued to deteriorate he is using Hydrofera Blue. He is still completing clindamycin for the cellulitis on the right leg although all of this looks better. 05/02/18 ooLeft buttock wound still with a lot of drainage and a very tightly adherent fibrinous necrotic surface. He has a deeper area superiorly ooThe left lateral calf wound is still closed ooDTI wound on the left heel necrotic surface especially the circumference using Iodoflex ooAreas between his left first second toe and right first second toe both look better. Dorsally and the right first second toe he had a necrotic surface although at smaller. In using silver alginate and ketoconazole. I did a culture last week which was a deep tissue culture of the reminiscence of the open wound on the right first second toe dorsally. This grew a few Acinetobacter and a few methicillin-resistant staph aureus. Nevertheless the area actually this week looked better. I didn't feel the need to specifically address this at least in terms of systemic antibiotics. 05/09/18; wounds are measuring larger more drainage per our intake. We are using Santyl covered with alginate on the large superficial buttock wounds, Iodosorb on the left heel, ketoconazole and silver alginate to the dorsal first and second toes bilaterally. 05/16/18; ooThe area on his left buttock better in some aspects although the area superiorly over the ischial tuberosity required an extensive debridement.using Santyl ooLeft heel appears stable. Using Iodoflex ooThe areas between his first and second toes are not bad however there is  spreading erythema up the dorsal aspect of his left foot this looks like cellulitis again. He is insensate the erythema is really very brilliant.o Erysipelas He went to see an allergist days ago because he was itching part of this he had lab work done. This showed a white count of 15.1 with 70% neutrophils. Hemoglobin of 11.4 and a platelet count of 659,000. Last white count we had in Epic was a 2-1/2 years ago which was 25.9 but he was ill at the time. He was able to show me some lab work that was done by his primary physician the pattern is about the same. 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 But prompted me to go ahead and do x-rays of both feet and the pelvis rule out osteomyelitis. He also had a comprehensive metabolic panel this was reasonably normal his albumin was 3.7 liver function tests BUN/creatinine all normal 05/23/18; x-rays of both his feet from last week were negative for underlying pulmonary  abnormality. The x-ray of his pelvis however showed mild irregularity in the left ischial which may represent some early osteomyelitis. The wound in the left ischial continues to get deeper clearly now exposed muscle. Each week necrotic surface material over this area. Whereas the rest of the wounds do not look so bad. ooThe left ischial wound we have been using Santyl and calcium alginate ooTo the left heel surface necrotic debris using Iodoflex ooThe left lateral leg is still healed ooAreas on the left dorsal foot and the right dorsal foot are about the same. There is some inflammation on the left which might represent contact dermatitis, fungal dermatitis I am doubtful cellulitis although this looks better than last week 05/30/18; CT scan done at Hospital did not show any osteomyelitis or abscess. Suggested the possibility of underlying cellulitis although I don't see a lot of evidence of this at the bedside ooThe wound itself on the  left buttock/upper thigh actually looks somewhat better. No debridement ooLeft heel also looks better no debridement continue Iodoflex ooBoth dorsal first second toe spaces appear better using Lotrisone. Left still required debridement 06/06/18; ooIntake reported some purulent looking drainage from the left gluteal wound. Using Santyl and calcium alginate ooLeft heel looks better although still a nonviable surface requiring debridement ooThe left dorsal foot first/second webspace actually expanding and somewhat deeper. I may consider doing a shave biopsy of this area ooRight dorsal foot first/second webspace appears stable to improved. Using Lotrisone and silver alginate to both these areas 06/13/18 ooLeft gluteal surface looks better. Now separated in the 2 wounds. No debridement required. Still drainage. We'll continue silver alginate ooLeft heel continues to look better with Iodoflex continue this for at least another week ooOf his dorsal foot wounds the area on the left still has some depth although it looks better than last week. We've been using Lotrisone and silver alginate 06/20/18 ooLeft gluteal continues to look better healthy tissue ooLeft heel continues to look better healthy granulation wound is smaller. He is using Iodoflex and his long as this continues continue the Iodoflex ooDorsal right foot looks better unfortunately dorsal left foot does not. There is swelling and erythema of his forefoot. He had minor trauma to this several days ago but doesn't think this was enough to have caused any tissue injury. Foot looks like cellulitis, we have had this problem before 06/27/18 on evaluation today patient appears to be doing a little worse in regard to his foot ulcer. 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 Both of which would  really be his only options for treating this infection. In the past he has been given and effusion of Orbactiv. This is done very well for him in the past again it's one time dosing IV antibiotic therapy. Subsequently I do believe this is something we're gonna need to see about doing at this point in time. Currently his other wounds seem to be doing somewhat better in my pinion I'm pretty happy in that regard. 07/03/18 on evaluation today patient's wounds actually appear to be doing fairly well. He has been tolerating the dressing changes without complication. All in all he seems to be showing signs of improvement. In regard to the antibiotics he has been dealing with infectious disease since I saw him last week as far as getting this scheduled. In the end he's going [facility] this coming Friday. In the meantime he has been continuing to perform the dressing changes in such as previous. There does not appear to be any evidence of infection worsengin at this time. 07/10/18; ooSince I last saw this man 2 weeks ago things have actually improved. IV antibiotics of resulted in less forefoot erythema although there is still some present. He is not systemically unwell ooLeft buttock wounds o2 now have no depth there is increased epithelialization Using silver alginate ooLeft heel still requires debridement using Iodoflex ooLeft dorsal foot still with a sizable wound about the size of a border but healthy granulation ooRight dorsal foot still with a slitlike area using silver alginate 07/18/18; the patient's cellulitis in the left foot is improved in fact I think it is on its way to resolving. ooLeft buttock wounds o2 both look better although the larger one has hypertension granulation we've been using silver alginate ooLeft heel has some thick circumferential redundant skin over the wound edge which will need to be removed today we've been  using Iodoflex ooLeft dorsal foot is still a sizable wound required debridement using silver alginate ooThe right dorsal foot is just about closed only a small open area remains here 07/25/18; left foot cellulitis is resolved ooLeft buttock wounds o2 both look better. Hyper-granulation on the major area ooLeft heel as some debris over the surface but otherwise looks a healthier wound. Using silver collagen ooRight dorsal foot is just about closed 07/31/18; arrives with our intake nurse worried about purulent drainage from the buttock. We had hyper-granulation here last week ooHis buttock wounds o2 continue to look better ooLeft heel some debris over the surface but measuring smaller. ooRight dorsal foot unfortunately has openings between the toes ooLeft foot superficial wound looks less aggravated. 08/07/18 ooButtock wounds continue to look better although some of her granulation and the larger medial wound. silver alginate ooLeft heel continues to look a lot better.silver collagen ooLeft foot superficial wound looks less stable. Requires debridement. He has a new wound superficial area on the foot on the lateral dorsal foot. ooRight foot looks better using silver alginate without Lotrisone 08/14/2018; patient was in the ER last week diagnosed with a UTI. He is now on Cefpodoxime and Macrodantin. ooButtock wounds continued to be smaller. Using silver alginate ooLeft heel continues to look better using silver collagen ooLeft foot superficial wound looks as though it is improving ooRight dorsal foot area is just about healed. 08/21/2018; patient is completed his antibiotics for his UTI. ooHe has 2 open areas on the buttocks. There is still not closed although the surface looks satisfactory. Using silver alginate ooLeft heel continues to improve using silver collagen ooThe bilateral dorsal foot areas which are at the base of his first and second toes/possible tinea pedis are  actually stable on the left but worse on the right. The area on the left required debridement of necrotic surface. After debridement I obtained a specimen for PCR culture. ooThe right dorsal foot which is been just about healed last week is now reopened 08/28/2018; culture done on the left dorsal foot showed coag negative staph both staph epidermidis and Lugdunensis. I think this is worthwhile initiating systemic treatment. I will use doxycycline given his long list of allergies. The area on the left heel slightly improved but still requiring debridement. ooThe large wound on the buttock is just about closed whereas the smaller one is larger. Using silver alginate in this area  09/04/2018; patient is completing his doxycycline for the left foot although this continues to be a very difficult wound area with very adherent necrotic debris. We are using silver alginate to all his wounds right foot left foot and the small wounds on his buttock, silver collagen on the left heel. 09/11/2018; once again this patient has intense erythema and swelling of the left forefoot. Lesser degrees of erythema in the right foot. He has a long list of allergies and intolerances. I will reinstitute doxycycline. 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 Using silver alginate ooLeft heel also looks better using silver collagen ooUnfortunately both the areas on his feet look worse. The area on the left first second webspace is now gone through to the plantar part of his foot. The area on the left foot anteriorly is irritated with erythema and swelling in the forefoot. 09/25/2018 ooHis wound on the left plantar heel looks better. Using silver collagen ooThe area on the left buttock 2 small remnant areas. One is closed one is still open. Using silver alginate ooThe areas between both his first and second toes look worse. This in spite of long-standing antifungal  therapy with ketoconazole and silver alginate which should have antifungal activity ooHe has small areas around his original wound on the left calf one is on the bottom of the original scar tissue and one superiorly both of these are small and superficial but again given wound history in this site this is worrisome 10/02/2018 ooLeft plantar heel continues to gradually contract using silver collagen ooLeft buttock wound is unchanged using silver alginate ooThe areas on his dorsal feet between his first and second toes bilaterally look about the same. 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 Small open area here. We have been using silver alginate 10/09/2018; ooLeft plantar heel continues to look healthy and contract. No debridement is required ooLeft buttock slightly smaller with a tape injury wound just below which was new this week ooDorsal feet somewhat improved I have been using clindamycin ooLeft lateral looks lower extremity the actual open area looks worse although a lot of this is epithelialized. I am going to change to silver collagen today He has a lot more swelling in the right leg although this is not pitting not red and not particularly warm there is a lot of spasm in the right leg usually indicative of people with paralysis of some underlying discomfort. We have reviewed his vascular status from 2017 he had a left greater saphenous vein ablation. I wonder about referring him back to vascular surgery if the area on the left leg continues to deteriorate. 10/16/2018 in today for follow-up and management of multiple lower extremity ulcers. His left Buttock wound is much lower smaller and almost closed completely. The wound to the left ankle has began to reopen with Epithelialization and some  adherent slough. He has multiple new areas to the left foot and leg. The left dorsal foot without much improvement. Wound present between left great webspace and 2nd toe. Erythema and edema present right leg. Right LE ultrasound obtained on 10/10/18 was negative for DVT. 10/23/2018; ooLeft buttock is closed over. Still dry macerated skin but there is no open wound. I suspect this is chronic pressure/moisture ooLeft lateral calf is quite a bit  worse than when I saw this last. There is clearly drainage here he has macerated skin into the left plantar heel. We will change the primary dressing to alginate ooLeft dorsal foot has some improvement in overall wound area. Still using clindamycin and silver alginate ooRight dorsal foot about the same as the left using clindamycin and silver alginate ooThe erythema in the right leg has resolved. He is DVT rule out was negative ooLeft heel pressure area required debridement although the wound is smaller and the surface is health 10/26/2018 ooThe patient came back in for his nurse check today predominantly because of the drainage coming out of the left lateral leg with a recent reopening of his original wound on the left lateral calf. He comes in today with a large amount of surrounding erythema around the wound extending from the calf into the ankle and even in the area on the dorsal foot. He is not systemically unwell. He is not febrile. Nevertheless this looks like cellulitis. We have been using silver alginate to the area. I changed him to a regular visit and I am going to prescribe him doxycycline. The rationale here is a long list of medication intolerances and a history of MRSA. I did not see anything that I thought would provide a valuable culture 10/30/2018 ooFollow-up from his appointment 4 days ago with really an extensive area of cellulitis in the left calf left lateral ankle and left dorsal foot. I put him on doxycycline. He has a long list  of medication allergies which are true allergy reactions. 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 In any case he is a lot better today. The erythema especially in the anterior and lateral left calf is better. He still has left ankle erythema. He also is complaining about increasing edema in the right leg we have only been using Kerlix Coban and he has been doing the wraps at home. Finally he has a spotty rash on the medial part of his upper left calf which looks like folliculitis or perhaps wrap occlusion type injury. Small superficial macules not pustules 11/06/18 patient arrives today with again a considerable degree of erythema around the wound on the left lateral calf extending into the dorsal ankle and dorsal foot. This is a lot worse than when I saw this last week. He is on doxycycline really with not a lot of improvement. He has not been systemically unwell Wounds on the; left heel actually looks improved. Original area on the left foot and proximity to the first and second toes looks about the same. He has superficial areas on the dorsal foot, anterior calf and then the reopening of his original wound on the left lateral calf which looks about the same ooThe only area he has on the right is the dorsal webspace first and second which is smaller. ooHe has a large area of dry erythematous skin on the left buttock small open area here. 11/13/2018; the patient arrives in much better condition. The erythema around the wound on the left lateral calf is a lot better. Not sure whether this was the clindamycin or the TCA and ketoconazole or just in the improvement in edema control [stasis dermatitis]. In any case this is a lot better. The area on the left heel is very small and just about resolved using silver collagen we have been using silver alginate to the areas on his dorsal feet 11/20/2018; his wounds include the  left  lateral calf, left heel, dorsal aspects of both feet just proximal to the first second webspace. He is stable to slightly improved. I did not think any changes to his dressings were going to be necessary 11/27/2018 he has a reopening on the left buttock which is surrounded by what looks like tinea or perhaps some other form of dermatitis. The area on the left dorsal foot has some erythema around it I have marked this area but I am not sure whether this is cellulitis or not. Left heel is not closed. Left calf the reopening is really slightly longer and probably worse 1/13; in general things look better and smaller except for the left dorsal foot. Area on the left heel is just about closed, left buttock looks better only a small wound remains in the skin looks better [using Lotrisone] 1/20; the area on the left heel only has a few remaining open areas here. Left lateral calf about the same in terms of size, left dorsal foot slightly larger right lateral foot still not closed. The area on the left buttock has no open wound and the surrounding skin looks a lot better 1/27; the area on the left heel is closed. Left lateral calf better but still requiring extensive debridements. The area on his left buttock is closed. He still has the open areas on the left dorsal foot which is slightly smaller in the right foot which is slightly expanded. We have been using Iodoflex on these areas as well 2/3; left heel is closed. Left lateral calf still requiring debridement using Iodoflex there is no open area on his left buttock however he has dry scaly skin over a large area of this. Not really responding well to the Lotrisone. Finally the areas on his dorsal feet at the level of the first second webspace are slightly smaller on the right and about the same on the left. Both of these vigorously debrided with Anasept and gauze 2/10; left heel remains closed he has dry erythematous skin over the left buttock but there  is no open wound here. Left lateral leg has come in and with. Still requiring debridement we have been using Iodoflex here. Finally the area on the left dorsal foot and right dorsal foot are really about the same extremely dry callused fissured areas. He does not yet have a dermatology appointment 2/17; left heel remains closed. He has a new open area on the left buttock. The area on the left lateral calf is bigger longer and still covered in necrotic debris. No major change in his foot areas bilaterally. I am awaiting for a dermatologist to look on this. We have been using ketoconazole I do not know that this is been doing any good at all. 2/24; left heel remains closed. The left buttock wound that was new reopening last week looks better. The left lateral calf appears better also although still requires debridement. The major area on his foot is the left first second also requiring debridement. We have been putting Prisma on all wounds. I do not believe that the ketoconazole has done too much good for his feet. He will use Lotrisone I am going to give him a 2-week course of terbinafine. We still do not have a dermatology appointment 3/2 left heel remains closed however there is skin over bone in this area I pointed this out to him today. The left buttock wound is epithelialized but still does not look completely stable. The area on the left leg  required debridement were using silver collagen here. With regards to his feet we changed to Lotrisone last week and silver alginate. 3/9; left heel remains closed. Left buttock remains closed. The area on the right foot is essentially closed. The left foot remains unchanged. Slightly smaller on the left lateral calf. Using silver collagen to both of these areas 3/16-Left heel remains closed. Area on right foot is closed. Left lateral calf above the lateral malleolus open wound requiring debridement with easy bleeding. Left dorsal wound proximal to first  toe also debrided. Left ischial area open new. Patient has been using Prisma with wrapping every 3 days. Dermatology appointment is apparently tomorrow.Patient has completed his terbinafine 2-week course with some apparent improvement according to him, there is still flaking and dry skin in his foot on the left 3/23; area on the right foot is reopened. The area on the left anterior foot is about the same still a very necrotic adherent surface. He still has the area on the left leg and reopening is on the left buttock. He apparently saw dermatology although I do not have a note. According to the patient who is usually fairly well informed they did not have any good ideas. Put him on oral terbinafine which she is been on before. 3/30; using silver collagen to all wounds. Apparently his dermatologist put him on doxycycline and rifampin presumably some culture grew staph. I do not have this result. He remains on terbinafine although I have used terbinafine on him before 4/6; patient has had a fairly substantial reopening on the right foot between the first and second toes. He is finished his terbinafine and I believe is on doxycycline and rifampin still as prescribed by dermatology. We have been using silver collagen to all his wounds although the patient reports that he thinks silver alginate does better on the wounds on his buttock. 4/13; the area on his left lateral calf about the same size but it did not require debridement. ooLeft dorsal foot just proximal to the webspace between the first and second toes is about the same. Still nonviable surface. I note some superficial bronze discoloration of the dorsal part of his foot ooRight dorsal foot just proximal to the first and second toes also looks about the same. I still think there may be the same discoloration I noted above on the left ooLeft buttock wound looks about the same 4/20; left lateral calf appears to be gradually contracting using  silver collagen. 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 The left dorsal foot wound is debrided of tightly adherent necrotic debris and really cleans up quite nicely. The right area is worse with expansion. I did not debride this it is now over the base of the second toe ooThe area on his left buttock is smaller no debridement is required using silver collagen 5/4; left calf continues to make good progress. ooHe arrives with erythema around the wounds on his dorsal foot which even extends to the plantar aspect. Very concerning for coexistent infection. He is finished the erythromycin I gave him for possible erythrasma this does not seem to have helped. ooThe area on the left foot is about the same base of the dorsal toes ooIs area on the buttock looks improved on the left 5/11; left calf and left buttock continued to make good progress. Left foot is about the same to slightly improved. ooMajor problem is on the right foot. He has not had an x-ray. Deep  tissue culture I did last week showed both Enterobacter and [name]. coli. I did not change the doxycycline I put him on empirically although neither 1 of these were plated to doxycycline. He arrives today with the erythema looking worse on both the dorsal and plantar foot. Macerated skin on the bottom of the foot. he has not been systemically unwell 5/18-Patient returns at 1 week, left calf wound appears to be making some progress, left buttock wound appears slightly worse than last time, left foot wound looks slightly better, right foot redness is marginally better. X-ray of both feet show no air or evidence of osteomyelitis. Patient is finished his Omnicef and terbinafine. He continues to have macerated skin on the bottom of the left foot as well as right 5/26; left calf wound is better, left buttock wound appears to have multiple small superficial open areas with surrounding  macerated skin. X-rays that I did last time showed no evidence of osteomyelitis in either foot. He is finished cefdinir and doxycycline. I do not think that he was on terbinafine. He continues to have a large superficial open area on the right foot anterior dorsal and slightly between the first and second toes. I did send him to dermatology 2 months ago or so wondering about whether they would do a fungal scraping. I do not believe they did but did do a culture. We have been using silver alginate to the toe areas, he has been using antifungals at home topically either ketoconazole or Lotrisone. We are using silver collagen on the left foot, silver alginate on the right, silver collagen on the left lateral leg and silver alginate on the left buttock 6/1; left buttock area is healed. We have the left dorsal foot, left lateral leg and right dorsal foot. We are using silver alginate to the areas on both feet and silver collagen to the area on his left lateral calf 6/8; the left buttock apparently reopened late last week. He is not really sure how this happened. He is tolerating the terbinafine. Using silver alginate to all wounds 6/15; left buttock wound is larger than last week but still superficial. ooCame in the clinic today with a report of purulence from the left lateral leg I did not identify any infection ooBoth areas on his dorsal feet appear to be better. He is tolerating the terbinafine. Using silver alginate to all wounds 6/22; left buttock is about the same this week, left calf quite a bit better. His left foot is about the same however he comes in with erythema and warmth in the right forefoot once again. Culture that I gave him in the beginning of May showed Enterobacter and [name]. coli. I gave him doxycycline and things seem to improve although neither 1 of these organisms was specifically plated. 6/29; left buttock is larger and dry this week. Left lateral calf looks to me to be improved.  Left dorsal foot also somewhat improved right foot completely unchanged. The erythema on the right foot is still present. He is completing the Ceftin dinner that I gave him empirically [see discussion above.) 7/6 - All wounds look to be stable and perhaps improved, the left buttock wound is slightly smaller, per patient bleeds easily, completed ceftin, the right foot redness is less, he is on terbinafine 7/13; left buttock wound about the same perhaps slightly narrower. Area on the left lateral leg continues to narrow. Left dorsal foot slightly smaller right foot about the same. We are using silver alginate on the  right foot and Hydrofera Blue to the areas on the left. Unna boot on the left 2 layer compression on the right 7/20; left buttock wound absolutely the same. Area on lateral leg continues to get better. Left dorsal foot require debridement as did the right no major change in the 7/27; left buttock wound the same size necrotic debris over the surface. The area on the lateral leg is closed once again. His left foot looks better right foot about the same although there is some involvement now of the posterior first second toe area. He is still on terbinafine which I have given him for a month, not certain a centimeter major change 06/25/19-All wounds appear to be slightly improved according to report, left buttock wound looks clean, both foot wounds have minimal to no debris the right dorsal foot has minimal slough. We are using Hydrofera Blue to the left and silver alginate to the right foot and ischial wound. 8/10-Wounds all appear to be around the same, the right forefoot distal part has some redness which was not there before, however the wound looks clean and small. Ischial wound looks about the same with no changes 8/17; his wound on the left lateral calf which was his original chronic venous insufficiency wound remains closed. Since I last saw him the areas on the left dorsal foot  right dorsal foot generally appear better but require debridement. The area on his left initial tuberosity appears somewhat larger to me perhaps hyper granulated and bleeds very easily. We have been using Hydrofera Blue to the left dorsal foot and silver alginate to everything else 8/24; left lateral calf remains closed. The areas on his dorsal feet on the webspace of the first and second toes bilaterally both look better. The area on the left buttock which is the pressure ulcer stage II slightly smaller. I change the dressing to Hydrofera Blue to all areas 8/31; left lateral calf remains closed. The area on his dorsal feet bilaterally look better. Using Hydrofera Blue. Still requiring debridement on the left foot. No change in the left buttock pressure ulcers however 9/14; left lateral calf remains closed. Dorsal feet look quite a bit better than 2 weeks ago. Flaking dry skin also a lot better with the ammonium lactate I gave him 2 weeks ago. The area on the left buttock is improved. He states that his Roho cushion developed a leak and he is getting a new one, in the interim he is offloading this vigorously 9/21; left calf remains closed. Left heel which was a possible DTI looks better this week. He had macerated tissue around the left dorsal foot right foot looks satisfactory and improved left buttock wound. I changed his dressings to his feet to silver alginate bilaterally. Continuing Hydrofera Blue on the left buttock. 9/28 left calf remains closed. Left heel did not develop anything [possible DTI] dry flaking skin on the left dorsal foot. Right foot looks satisfactory. Improved left buttock wound. We are using silver alginate on his feet Hydrofera Blue on the buttock. I have asked him to go back to the Lotrisone on his feet including the wounds and surrounding areas 10/5; left calf remains closed. The areas on the left and right feet about the same. A lot of this is epithelialized however  debris over the remaining open areas. He is using Lotrisone and silver alginate. The area on the left buttock using Hydrofera Blue 10/26. Patient has been out for 3 weeks secondary to Covid concerns. He tested negative  but I think his wife tested positive. He comes in today with the left foot substantially worse, right foot about the same. Even more concerning he states that the area on his left buttock closed over but then reopened and is considerably deeper in one aspect than it was before [stage III wound] Objective Constitutional Sitting or standing Blood Pressure is within target range for patient.Marland Kitchen Heart rate slightly tachycardic. Respirations regular, non-labored and within target range.. Temperature is normal and within the target range for the patient.Marland Kitchen Appears in no distress. He does not look to be systemically unwell. Vitals Time Taken: 7:59 AM, Height: 70 in, Weight: 216 lbs, BMI: 31, Temperature: 98.6 F, Pulse: 110 bpm, Respiratory Rate: 20 breaths/min, Blood Pressure: 126/77 mmHg. General Notes: Wound exam; left dorsal foot now a deeper wound with necrotic debris on the surface surrounding loss of epithelium. Debridement with a #5 curette still not clear whether I got to a healthy surface. There is erythema extending proximally from the wound with loss of surface epithelium question the beginnings of cellulitis/stasis dermatitis ooRight dorsal foot about the same as I remember last time. ooLeft buttock has 2 closely placed wounds. The area more superiorly and laterally is now a stage III wound. There is no debridement necessary here. Surrounding skin again erythematous somewhat angry looking Integumentary (Hair, Skin) Wound #17 status is Open. Original cause of wound was Gradually Appeared. The wound is located on the Right,Dorsal Toe - Web between 1st and 2nd. The wound measures 6cm length x 3cm width x 0.1cm depth; 14.137cm^2 area and 1.414cm^3 volume. There is Fat Layer  (Subcutaneous Tissue) Exposed exposed. There is no tunneling or undermining noted. There is a medium amount of serosanguineous drainage noted. The wound margin is distinct with the outline attached to the wound base. There is small (1-33%) pale granulation within the wound bed. There is a large (67-100%) amount of necrotic tissue within the wound bed including Adherent Slough. Wound #24 status is Open. Original cause of wound was Gradually Appeared. The wound is located on the Left,Dorsal Foot. The wound measures 3.8cm length x 5cm width x 0.1cm depth; 14.923cm^2 area and 1.492cm^3 volume. There is Fat Layer (Subcutaneous Tissue) Exposed exposed. There is no tunneling or undermining noted. There is a medium amount of serosanguineous drainage noted. The wound margin is distinct with the outline attached to the wound base. There is small (1-33%) pale granulation within the wound bed. There is a large (67- 100%) amount of necrotic tissue within the wound bed including Adherent Slough. Wound #35 status is Open. Original cause of wound was Gradually Appeared. The wound is located on the Left Ischium. The wound measures 5.4cm length x 2cm width x 1.1cm depth; 8.482cm^2 area and 9.331cm^3 volume. There is Fat Layer (Subcutaneous Tissue) Exposed exposed. There is no tunneling or undermining noted. There is a medium amount of serosanguineous drainage noted. The wound margin is flat and intact. There is large (67-100%) red, friable granulation within the wound bed. There is a small (1-33%) amount of necrotic tissue within the wound bed including Adherent Slough. 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 There was a Excisional Skin/Subcutaneous Tissue Debridement with a total area of 19  sq cm performed by Ricard Dillon., MD. With the following instrument(s): Curette to remove Viable and Non-Viable tissue/material. Material removed includes Subcutaneous Tissue and Slough and. No specimens were taken. A time out was conducted at 08:25, prior to the start of the procedure. A Minimum amount of bleeding was controlled with Pressure. The procedure was tolerated well with a pain level of 0 throughout and a pain level of 0 following the procedure. Post Debridement Measurements: 3.8cm length x 5cm width x 0.1cm depth; 1.492cm^3 volume. Character of Wound/Ulcer Post Debridement is improved. Post procedure Diagnosis Wound #24: Same as Pre-Procedure Plan Follow-up Appointments: Return Appointment in 1 week. Dressing Change Frequency: Wound #17 Right,Dorsal Toe - Web between 1st and 2nd: Change Dressing every other day. Wound #24 Left,Dorsal Foot: Change Dressing every other day. Wound #35 Left Ischium: Change Dressing every other day. Skin Barriers/Peri-Wound Care: Antifungal cream - on toes on both feet daily Moisturizing lotion - both legs Other: - Triamcinolone cream Wound #35 Left Ischium: Barrier cream Wound Cleansing: Wound #17 Right,Dorsal Toe - Web between 1st and 2nd: Clean wound with Wound Cleanser Primary Wound Dressing: Wound #17 Right,Dorsal Toe - Web between 1st and 2nd: Calcium Alginate with Silver Wound #24 Left,Dorsal Foot: Calcium Alginate with Silver Wound #35 Left Ischium: Hydrofera Blue Secondary Dressing: Wound #17 Right,Dorsal Toe - Web between 1st and 2nd: Kerlix/Rolled Gauze Dry Gauze Wound #24 Left,Dorsal Foot: Kerlix/Rolled Gauze Dry Gauze Heel Cup - add heel cup to left heel for protection. Wound #35 Left Ischium: Foam Border Edema Control: Elevate legs to the level of the heart or above for 30 minutes daily and/or  when sitting, a frequency of: - throughout the day Support Garment 30-40 mm/Hg pressure to: - Juxtalite to both legs. Off-Loading: Low air-loss mattress (Group 2) Roho cushion for wheelchair Turn and reposition every 2 hours - out of wheelchair throughout the day, try to lay on sides, sleep in the bed not the recliner The following medication(s) was prescribed: doxycycline monohydrate oral 100 mg capsule 1 capsule oral bid for 10 days for celluitis left foot starting 09/17/2019 1. Continue with silver alginate to both areas on the right and left foot 2. Empiric doxycycline for 10 days question cellulitis of the left foot 3. I would like him to use Lotrisone to the surrounding skin on the left buttock. Question fungal dermatitis versus maceration from continued wetness irritation etc. Electronic Signature(s) Signed: 09/17/2019 8:52:23 AM By: Linton Ham MD Previous Signature: 09/17/2019 8:50:29 AM Version By: Linton Ham MD Entered By: Linton Ham on 09/17/2019 08:52:23 -------------------------------------------------------------------------------- SuperBill Details Patient Name: Date of Service: [name], [name] 09/17/2019 Medical Record [id number] Patient Account Number: [id number] Date of Birth/Sex: Treating RN: 26-Feb-1986 (33 y.o. [name] Primary Care Provider: Browndell, Stanton Other Clinician: Referring Provider: Treating Provider/Extender:Robson, [name], [name]: 193 Diagnosis Coding ICD-10 Codes Code Description L97.511 Non-pressure chronic ulcer of other part of right 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 L97.521 Non-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, complete 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, stage 3 Facility Procedures CPT4 Code Description: [id number] - DEB SUBQ TISSUE 20 SQ CM/< ICD-10 Diagnosis Description L97.521 Non-pressure chronic ulcer of other  part of left foot limit Modifier: ed to breakdow Quantity: 1 n of skin Physician Procedures CPT4 Code Description: [id number] - WC PHYS SUBQ TISS 20 SQ CM ICD-10 Diagnosis Description L97.521 Non-pressure chronic ulcer of other part of left foot  limit Modifier: ed to breakdow Quantity: 1 n of skin Electronic Signature(s) Signed: 09/17/2019 5:48:14 PM By: Linton Ham MD Entered By: Linton Ham on 09/17/2019 08:50:47

## 2019-09-24 ENCOUNTER — Encounter (HOSPITAL_BASED_OUTPATIENT_CLINIC_OR_DEPARTMENT_OTHER): Payer: BC Managed Care – PPO | Attending: Internal Medicine | Admitting: Internal Medicine

## 2019-09-24 ENCOUNTER — Other Ambulatory Visit: Payer: Self-pay

## 2019-09-24 DIAGNOSIS — L97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: Secondary | ICD-10-CM | POA: Insufficient documentation

## 2019-09-24 DIAGNOSIS — Z8614 Personal history of Methicillin resistant Staphylococcus aureus infection: Secondary | ICD-10-CM | POA: Insufficient documentation

## 2019-09-24 DIAGNOSIS — G8220 Paraplegia, unspecified: Secondary | ICD-10-CM | POA: Insufficient documentation

## 2019-09-24 DIAGNOSIS — L97922 Non-pressure chronic ulcer of unspecified part of left lower leg with fat layer exposed: Secondary | ICD-10-CM | POA: Diagnosis not present

## 2019-09-24 DIAGNOSIS — L97519 Non-pressure chronic ulcer of other part of right foot with unspecified severity: Secondary | ICD-10-CM | POA: Diagnosis not present

## 2019-09-24 DIAGNOSIS — L89523 Pressure ulcer of left ankle, stage 3: Secondary | ICD-10-CM | POA: Diagnosis not present

## 2019-09-24 DIAGNOSIS — L89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, stage 3: Secondary | ICD-10-CM | POA: Diagnosis not present

## 2019-09-24 DIAGNOSIS — S91302A Unspecified open wound, left foot, initial encounter: Secondary | ICD-10-CM | POA: Diagnosis not present

## 2019-09-24 DIAGNOSIS — I872 Venous insufficiency (chronic) (peripheral): Secondary | ICD-10-CM | POA: Insufficient documentation

## 2019-09-24 DIAGNOSIS — L97529 Non-pressure chronic ulcer of other part of left foot with unspecified severity: Secondary | ICD-10-CM | POA: Diagnosis not present

## 2019-09-27 NOTE — Progress Notes (Signed)
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 also has areas in his first and second toes bilaterally in the anterior and posterior web space. I had been using Iodoflex here after a prolonged course of silver alginate with ketoconazole was ineffective [question tinea pedis] 07/14/17; patient arrives today with a very difficult adherent material over his left lateral lower leg wound. He also has surrounding erythema and poorly controlled edema. He was switched his Santyl last visit which the nurses are applying once during his doctor visit and once on a nurse visit. He was also reduced to 2 layer compression I'm not exactly sure of the issue here. 07/21/17; better surface today after 1 week of Iodoflex. Significant cellulitis that we treated last week also better. [Doxycycline] 07/28/17 better surface today with now 2 weeks of Iodoflex. Significant cellulitis treated with doxycycline. He has now completed the doxycycline and he is back to his usual degree of chronic venous inflammation/stasis dermatitis. He reminds me  he has had ablations surgery here 08/04/17; continued improvement with Iodoflex to the left lateral leg wound in terms of the surface of the  wound although the dimensions are better. 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 Problematic areas on the plantar aspect of the first second toe web space on the left and the dorsal aspect of the first second toe web space on the right. At one point I felt these were probably related to chronic fungal infections in treated him aggressively for this although we have not made any improvement here. 08/11/17; left lateral leg. Surface continues to improve with the Iodoflex although we are not seeing much improvement in overall wound dimensions. Areas on his plantar left foot and right foot show no improvement. In fact the right foot looks somewhat worse 08/18/17; left lateral leg. We changed to [facility] Blue last week after a prolonged course of Iodoflex which helps get the surface better. It appears that the wound with is improved. Continue with difficult areas on the left dorsal first second and plantar first second on the right 09/01/17; patient arrives in clinic today having had a temperature of 103 yesterday. He was seen in the ER and [facility]. The patient was concerned he could have cellulitis again in the right leg however they diagnosed him with a UTI and he is now on Keflex. He has a history of cellulitis which is been recurrent and difficult but this is been in the left leg, in the past 5 use doxycycline. He does in and out catheterizations at home which are risk factors for UTI 09/08/17; patient will be completing his Keflex this weekend. The erythema on the left leg is considerably better. He has a new wound today on the medial part of the right leg small superficial almost looks like a skin tear. He has worsening of the area on the right dorsal first and second toe. His major area on the left lateral leg is better. Using Hydrofera Blue on all areas 09/15/17; gradual reduction in width on the long wound in the left  lateral leg. No debridement required. He also has wounds on the plantar aspect of his left first second toe web space and on the dorsal aspect of the right first second toe web space. 09/22/17; there continues to be very gradual improvements in the dimensions of the left lateral leg wound. He hasn't round erythematous spot with might be pressure on his wheelchair. There is no evidence obviously of infection no purulence no warmth He has a dry scaled area on the plantar aspect of the left first second toe Improved area on the dorsal right first second toe. 09/29/17; left lateral leg wound continues to improve in dimensions mostly with an is still a fairly long but increasingly narrow wound. He has a dry scaled area on the plantar aspect of his left first second toe web space Increasingly concerning area on the dorsal right first second toe. In fact I am concerned today about possible cellulitis around this wound. The areas extending up his second toe and although there is deformities here almost appears to abut on the nailbed. 10/06/17; left lateral leg wound continues to make very gradual progress. Tissue culture I did from the right first second toe dorsal foot last time grew MRSA and enterococcus which was vancomycin sensitive. This was not sensitive to clindamycin or doxycycline. He is allergic to Zyvox and sulfa we have therefore arrange for him  to have dalvance infusion tomorrow. He is had this in the past and tolerated it well 10/20/17; left lateral leg wound continues to make decent progress. This is certainly reduced in terms of with there is advancing epithelialization.The cellulitis in the right foot looks better although he still has a deep wound in the dorsal aspect of the first second toe web space. Plantar left first toe web space on the left I think is making some progress 10/27/17; left lateral leg wound continues to make decent progress. Advancing epithelialization.using Hydrofera  Blue The right first second toe web space wound is better-looking using silver alginate Improvement in the left plantar first second toe web space. Again using silver alginate 11/03/17 left lateral leg wound continues to make decent progress albeit slowly. Using [facility] The right per second toe web space continues to be a very problematic looking punched out wound. I obtained a piece of tissue for deep culture I did extensively treated this for fungus. It is difficult to imagine that this is a pressure area as the patient states other than going outside he doesn't really wear shoes at home The left plantar first second toe web space looked fairly senescent. Necrotic edges. This required debridement change to [facility] Blue to all wound areas 11/10/17; left lateral leg wound continues to contract. Using Hydrofera Blue On the right dorsal first second toe web space dorsally. Culture I did of this area last week grew MRSA there is not an easy oral option in this patient was multiple antibiotic allergies or intolerances. This was only a rare culture isolate I'm therefore going to use Bactroban under silver alginate On the left plantar first second toe web space. Debridement is required here. This is also unchanged 11/17/17; left lateral leg wound continues to contract using Hydrofera Blue this is no longer the major issue. The major concern here is the right first second toe web space. He now has an open area going from dorsally to the plantar aspect. There is now wound on the inner lateral part of the first toe. Not a very viable surface on this. There is erythema spreading medially into the forefoot. No major change in the left first second toe plantar wound 11/24/17; left lateral leg wound continues to contract using Hydrofera Blue. Nice improvement today The right first second toe web space all of this looks a lot less angry than last week. I have given him clindamycin and topical Bactroban  for MRSA and terbinafine for the possibility of underlining tinea pedis that I could not control with ketoconazole. Looks somewhat better The area on the plantar left first second toe web space is weeping with dried debris around the wound 12/01/17; left lateral leg wound continues to contract he Hydrofera Blue. It is becoming thinner in terms of with nevertheless it is making good improvement. The right first second toe web space looks less angry but still a large necrotic-looking wounds starting on the plantar aspect of the right foot extending between the toes and now extensively on the base of the right second toe. I gave him clindamycin and topical Bactroban for MRSA anterior benefiting for the possibility of underlying tinea pedis. Not looking better today The area on the left first/second toe looks better. Debrided of necrotic debris 12/05/17* the patient was worked in urgently today because over the weekend he found blood on his incontinence bad when he woke up. He was found to have an ulcer by his wife who does most of his wound  care. He came in today for [place] to look at this. He has not had a history of wounds in his buttocks in spite of his paraplegia. 12/08/17; seen in follow-up today at his usual appointment. He was seen earlier this week and found to have a new wound on his buttock. We also follow him for wounds on the left lateral leg, left first second toe web space and right first second toe web space 12/15/17; we have been using Hydrofera Blue to the left lateral leg which has improved. The right first second toe web space has also improved. Left first second toe web space plantar aspect looks stable. The left buttock has worsened using Santyl. Apparently the buttock has drainage 12/22/17; we have been using Hydrofera Blue to the left lateral leg which continues to improve now 2 small wounds separated by normal skin. He tells [place] he had a fever up to 100 yesterday he is prone to UTIs  but has not noted anything different. He does in and out catheterizations. The area between the first and second toes today does not look good necrotic surface covered with what looks to be purulent drainage and erythema extending into the third toe. I had gotten this to something that I thought look better last time however it is not look good today. He also has a necrotic surface over the buttock wound which is expanded. I thought there might be infection under here so I removed a lot of the surface with a #5 curet though nothing look like it really needed culturing. He is been using Santyl to this area 12/27/17; his original wound on the left lateral leg continues to improve using Hydrofera Blue. I gave him samples of Baxdella although he was unable to take them out of fear for an allergic reaction ["lump in his throat"].the culture I did of the purulent drainage from his second toe last week showed both enterococcus and a set Enterobacter I was also concerned about the erythema on the bottom of his foot although paradoxically although this looks somewhat better today. 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 We've been using silver alginate here. They came up today that he sleeps in a recliner, I'm not sure why but I asked him to stop this 01/03/18; his original wound we've been using Hydrofera Blue is now separated into 2 areas. Ulcer on his left buttock is better he is off the recliner and sleeping in bed Finally both wound areas between his first and second toes also looks some better 01/10/18; his original wound on the left lateral leg is now separated into 2 wounds we've been using Hydrofera Blue Ulcer on his left buttock has some drainage. There is a small probing site going into muscle layer superiorly.using silver alginate -He arrives today with a deep tissue injury on the left heel The wound  on the dorsal aspect of his first second toe on the left looks a lot betterusing silver alginate ketoconazole The area on the first second toe web space on the right also looks a lot bette 01/17/18; his original wound on the left lateral leg continues to progress using Hydrofera Blue Ulcer on his left buttock also is smaller surface healthier except for a small probing site going into the muscle layer superiorly. 2.4 cm of tunneling in this area DTI on his left heel we have only been offloading. Looks better than last week  no threatened open no evidence of infection the wound on the dorsal aspect of the first second toe on the left continues to look like it's regressing we have only been using silver alginate and terbinafine orally The area in the first second toe web space on the right also looks to be a lot better using silver alginate and terbinafine I think this was prompted by tinea pedis 01/31/18; the patient was hospitalized in [place] last week apparently for a complicated UTI. He was discharged on cefepime he does in and out catheterizations. In the hospital he was discovered M I don't mild elevation of AST and ALTs and the terbinafine was stopped.predictably the pressure ulcer on his buttock looks betterusing silver alginate. The area on the left lateral leg also is better using Hydrofera Blue. The area between the first and second toes on the left better. First and second toes on the right still substantial but better. Finally the DTI on the left heel has held together and looks like it's resolving 02/07/18-he is here in follow-up evaluation for multiple ulcerations. He has new injury to the lateral aspect of the last issue a pressure ulcer, he states this is from adhesive removal trauma. He states he has tried multiple adhesive products with no success. All other ulcers appear stable. The left heel DTI is resolving. We will continue with same treatment plan and follow-up next  week. 02/14/18; follow-up for multiple areas. He has a new area last week on the lateral aspect of his pressure ulcer more over the posterior trochanter. The original pressure ulcer looks quite stable has healthy granulation. 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 Almost fully epithelialized on the original superior area using Hydrofera Blue DTI on the left heel has peeled off this week to reveal a small superficial wound under denuded skin and subcutaneous tissue Both areas between the first and second toes look better including nothing open on the left 02/21/18; The patient's wounds on his left ischial tuberosity and posterior left greater trochanter actually looked better. He has a large area of irritation around the area which I think is contact dermatitis. I am doubtful that this is fungal His original wound on the left lateral calf continues to improve we have been using Hydrofera Blue There is no open area in the left first second toe web space although there is a lot of thick callus The DTI on the left heel required debridement today of necrotic surface eschar and subcutaneous tissue using silver alginate Finally the area on the right first second toe webspace continues to contract using silver alginate and ketoconazole 02/28/18 Left ischial tuberosity wounds look better using silver alginate. Original wound on the left calf only has one small open area left using Hydrofera Blue DTI on the left heel required debridement mostly removing skin from around this wound surface. Using silver alginate The areas on the right first/second toe web space using silver alginate and ketoconazole 03/08/18 on evaluation today patient appears to be doing decently well as best I can tell in regard to his wounds. This is the first time that I have seen him as he generally is followed by Dr. Dellia Nims. With  that being said none of his wounds appear to be infected he does have an area where there is some skin covering what appears to be a new wound on the left dorsal surface of his great toe.  This is right at the nail bed. With that being said I do believe that debrided away some of the excess skin can be of benefit in this regard. Otherwise he has been tolerating the dressing changes without complication. 03/14/18; patient arrives today with the multiplicity of wounds that we are following. He has not been systemically unwell Original wound on the left lateral calf now only has 2 small open areas we've been using Hydrofera Blue which should continue The deep tissue injury on the left heel requires debridement today. We've been using silver alginate The left first second toe and the right first second toe are both are reminiscence what I think was tinea pedis. Apparently some of the callus Surface between the toes was removed last week when it started draining. Purulent drainage coming from the wound on the ischial tuberosity on the left. 03/21/18-He is here in follow-up evaluation for multiple wounds. 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 He tolerated debridement. The only change to last week's recommendations is to discontinue antifungal cream between toes. He will follow-up next week 03/28/18; following up for multiple wounds;Concern this week is streaking redness and swelling in the right foot. He is going to need antibiotics for this. 03/31/18; follow-up for right foot cellulitis. Streaking redness and swelling in the right foot on 03/28/18. 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 I put him on clindamycin 300 mg every 6 and brought him in for a quick check. He has an open wound between his first and second toes on the right foot as a potential source. 04/04/18; Right foot cellulitis is resolving he  is completing clindamycin. This is truly good news Left lateral calf wound which is initial wound only has one small open area inferiorly this is close to healing out. He has compression stockings. We will use Hydrofera Blue right down to the epithelialization of this Nonviable surface on the left heel which was initially pressure with a DTI. We've been using Hydrofera Blue. I'm going to switch this back to silver alginate Left first second toe/tinea pedis this looks better using silver alginate Right first second toe tinea pedis using silver alginate Large pressure ulcers on theLeft ischial tuberosity. Small wound here Looks better. I am uncertain about the surface over the large wound. Using silver alginate 04/11/18; Cellulitis in the right foot is resolved Left lateral calf wound which was his original wounds still has 2 tiny open areas remaining this is just about closed Nonviable surface on the left heel is better but still requires debridement Left first second toe/tinea pedis still open using silver alginate Right first second toe wound tinea pedis I asked him to go back to using ketoconazole and silver alginate Large pressure ulcers on the left ischial tuberosity this shear injury here is resolved. Wound is smaller. No evidence of infection using silver alginate 04/18/18; Patient arrives with an intense area of cellulitis in the right mid lower calf extending into the right heel area. Bright red and warm. Smaller area on the left anterior leg. He has a significant history of MRSA. He will definitely need antibioticsdoxycycline He now has 2 open areas on the left ischial tuberosity the original large wound and now a satellite area which I think was above his initial satellite areas. Not a wonderful surface on this satellite area surrounding erythema which looks like pressure related. His left lateral calf wound again his original wound is just about  closed Left heel pressure injury  still requiring debridement Left first second toe looks a lot better using silver alginate Right first second toe also using silver alginate and ketoconazole cream also looks better 04/20/18; the patient was worked in early today out of concerns with his cellulitis on the right leg. I had started him on doxycycline. This was 2 days ago. His wife was concerned about the swelling in the area. Also concerned about the left buttock. He has not been systemically unwell no fever chills. No nausea vomiting or diarrhea 04/25/18; the patient's left buttock wound is continued to deteriorate he is using Hydrofera Blue. He is still completing clindamycin for the cellulitis on the right leg although all of this looks better. 05/02/18 Left buttock wound still with a lot of drainage and a very tightly adherent fibrinous necrotic surface. He has a deeper area superiorly The left lateral calf wound is still closed DTI wound on the left heel necrotic surface especially the circumference using Iodoflex Areas between his left first second toe and right first second toe both look better. Dorsally and the right first second toe he had a necrotic surface although at smaller. In using silver alginate and ketoconazole. I did a culture last week which was a deep tissue culture of the reminiscence of the open wound on the right first second toe dorsally. This grew a few Acinetobacter and a few methicillin-resistant staph aureus. Nevertheless the area actually this week looked better. I didn't feel the need to specifically address this at least in terms of systemic antibiotics. 05/09/18; wounds are measuring larger more drainage per our intake. We are using Santyl covered with alginate on the large superficial buttock wounds, Iodosorb on the left heel, ketoconazole and silver alginate to the dorsal first and second toes bilaterally. 05/16/18; The area on his left buttock better in some aspects although the area superiorly over  the ischial tuberosity required an extensive debridement.using Santyl Left heel appears stable. Using Iodoflex The areas between his first and second toes are not bad however there is spreading erythema up the dorsal aspect of his left foot this looks like cellulitis again. He is insensate the erythema is really very brilliant.o Erysipelas He went to see an allergist days ago because he was itching part of this he had lab work done. This showed a white count of 15.1 with 70% neutrophils. Hemoglobin of 11.4 and a platelet count of 659,000. Last white count we had in Epic was a 2-1/2 years ago which was 25.9 but he was ill at the time. He was able to show me some lab work that was done by his primary physician the pattern is about the same. 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 But prompted me to go ahead and do x-rays of both feet and the pelvis rule out osteomyelitis. He also had a comprehensive metabolic panel this was reasonably normal his albumin was 3.7 liver function tests BUN/creatinine all normal 05/23/18; x-rays of both his feet from last week were negative for underlying pulmonary abnormality. The x-ray of his pelvis however showed mild irregularity in the left ischial which may represent some early osteomyelitis. The wound in the left ischial continues to get deeper clearly now exposed muscle. Each week necrotic surface material over this area. Whereas the rest of the wounds do not look so bad. The left ischial wound we have been using Santyl and calcium alginate To the left heel surface  necrotic debris using Iodoflex The left lateral leg is still healed Areas on the left dorsal foot and the right dorsal foot are about the same. There is some inflammation on the left which might represent contact dermatitis, fungal dermatitis I am doubtful cellulitis although this looks better than last week 05/30/18; CT scan done at Hospital did not show  any osteomyelitis or abscess. Suggested the possibility of underlying cellulitis although I don't see a lot of evidence of this at the bedside The wound itself on the left buttock/upper thigh actually looks somewhat better. No debridement Left heel also looks better no debridement continue Iodoflex Both dorsal first second toe spaces appear better using Lotrisone. Left still required debridement 06/06/18; Intake reported some purulent looking drainage from the left gluteal wound. Using Santyl and calcium alginate Left heel looks better although still a nonviable surface requiring debridement The left dorsal foot first/second webspace actually expanding and somewhat deeper. I may consider doing a shave biopsy of this area Right dorsal foot first/second webspace appears stable to improved. Using Lotrisone and silver alginate to both these areas 06/13/18 Left gluteal surface looks better. Now separated in the 2 wounds. No debridement required. Still drainage. We'll continue silver alginate Left heel continues to look better with Iodoflex continue this for at least another week Of his dorsal foot wounds the area on the left still has some depth although it looks better than last week. We've been using Lotrisone and silver alginate 06/20/18 Left gluteal continues to look better healthy tissue Left heel continues to look better healthy granulation wound is smaller. He is using Iodoflex and his long as this continues continue the Iodoflex Dorsal right foot looks better unfortunately dorsal left foot does not. There is swelling and erythema of his forefoot. He had minor trauma to this several days ago but doesn't think this was enough to have caused any tissue injury. Foot looks like cellulitis, we have had this problem before 06/27/18 on evaluation today patient appears to be doing a little worse in regard to his foot ulcer. 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 Both of which would really be his only options for treating this infection. In the past he has been given and effusion of Orbactiv. This is done very well for him in the past again it's one time dosing IV antibiotic therapy. Subsequently I do believe this is something we're gonna need to see about doing at this point in time. Currently his other wounds seem to be doing somewhat better in my pinion I'm pretty happy in that regard. 07/03/18 on evaluation today patient's wounds actually appear to be doing fairly well. He has been tolerating the dressing changes without complication. All in all he seems to be showing signs of improvement. In regard to the antibiotics he has been dealing with infectious disease since I saw him last week as far as getting this scheduled. In the end [facility]'s [facility] this coming Friday. In the meantime he has been continuing to perform the dressing changes in such as previous. There does not appear to be any evidence of infection worsengin at this time. 07/10/18; Since I last saw this man 2 weeks ago things have actually improved. IV antibiotics of resulted in less forefoot erythema although there is still some  present. He is not systemically unwell Left buttock wounds 2 now have no depth there is increased epithelialization Using silver alginate Left heel still requires debridement using Iodoflex Left dorsal foot still with a sizable wound about the size of a border but healthy granulation Right dorsal foot still with a slitlike area using silver alginate 07/18/18; the patient's cellulitis in the left foot is improved in fact I think it is on its way to resolving. Left buttock wounds 2 both look better although the larger one has hypertension granulation we've been using silver alginate Left heel has some  thick circumferential redundant skin over the wound edge which will need to be removed today we've been using Iodoflex Left dorsal foot is still a sizable wound required debridement using silver alginate The right dorsal foot is just about closed only a small open area remains here 07/25/18; left foot cellulitis is resolved Left buttock wounds 2 both look better. Hyper-granulation on the major area Left heel as some debris over the surface but otherwise looks a healthier wound. Using silver collagen Right dorsal foot is just about closed 07/31/18; arrives with our intake nurse worried about purulent drainage from the buttock. We had hyper-granulation here last week His buttock wounds 2 continue to look better Left heel some debris over the surface but measuring smaller. Right dorsal foot unfortunately has openings between the toes Left foot superficial wound looks less aggravated. 08/07/18 Buttock wounds continue to look better although some of her granulation and the larger medial wound. silver alginate Left heel continues to look a lot better.silver collagen Left foot superficial wound looks less stable. Requires debridement. He has a new wound superficial area on the foot on the lateral dorsal foot. Right foot looks better using silver alginate without Lotrisone 08/14/2018; patient was in the ER last week diagnosed with a UTI. He is now on Cefpodoxime and Macrodantin. Buttock wounds continued to be smaller. Using silver alginate Left heel continues to look better using silver collagen Left foot superficial wound looks as though it is improving Right dorsal foot area is just about healed. 08/21/2018; patient is completed his antibiotics for his UTI. He has 2 open areas on the buttocks. There is still not closed although the surface looks satisfactory. Using silver alginate Left heel continues to improve using silver collagen The bilateral dorsal foot areas which are at the base of his first  and second toes/possible tinea pedis are actually stable on the left but worse on the right. The area on the left required debridement of necrotic surface. After debridement I obtained a specimen for PCR culture. The right dorsal foot which is been just about healed last week is now reopened 08/28/2018; culture done on the left dorsal foot showed coag negative staph both staph epidermidis and Lugdunensis. I think this is worthwhile initiating systemic treatment. I will use doxycycline given his long list of allergies. The area on the left heel slightly improved but still requiring debridement. The large wound on the buttock is just about closed whereas the smaller one is larger. Using silver alginate in this area 09/04/2018; patient is completing his doxycycline for the left foot although this continues to be a very difficult wound area with very adherent necrotic debris. We are using silver alginate to all his wounds right foot left foot and the small wounds on his buttock, silver collagen on the left heel. 09/11/2018; once again this patient has intense erythema and swelling of the left forefoot. Lesser degrees of erythema in  the right foot. He has a long list of allergies and intolerances. I will reinstitute doxycycline. 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 Using silver alginate Left heel also looks better using silver collagen Unfortunately both the areas on his feet look worse. The area on the left first second webspace is now gone through to the plantar part of his foot. The area on the left foot anteriorly is irritated with erythema and swelling in the forefoot. 09/25/2018 His wound on the left plantar heel looks better. Using silver collagen The area on the left buttock 2 small remnant areas. One is closed one is still open. Using silver alginate The areas between both his first and second toes look worse. This in spite of  long-standing antifungal therapy with ketoconazole and silver alginate which should have antifungal activity He has small areas around his original wound on the left calf one is on the bottom of the original scar tissue and one superiorly both of these are small and superficial but again given wound history in this site this is worrisome 10/02/2018 Left plantar heel continues to gradually contract using silver collagen Left buttock wound is unchanged using silver alginate The areas on his dorsal feet between his first and second toes bilaterally look about the same. I prescribed clindamycin ointment to see if we can address chronic staph colonization and also the underlying possibility of erythrasma The left lateral lower extremity wound is actually on the lateral part of his ankle. Small open area here. We have been using silver alginate 10/09/2018; Left plantar heel continues to look healthy and contract. No debridement is required Left buttock slightly smaller with a tape injury wound just below which was new this week Dorsal feet somewhat improved I have been using clindamycin Left lateral looks lower extremity the actual open area looks worse although a lot of this is epithelialized. I am going to change to silver collagen today He has a lot more swelling in the right leg although this is not pitting not red and not particularly warm there is a lot of spasm in the right leg usually indicative of people with paralysis of some underlying discomfort. We have reviewed his vascular status from 2017 he had a left greater saphenous vein ablation. I wonder about referring him back to vascular surgery if the area on the left leg continues to deteriorate. 10/16/2018 in today for follow-up and management of multiple lower extremity ulcers. His left Buttock wound is much lower smaller and almost closed completely. The wound to the left ankle has began to reopen with Epithelialization and some  adherent slough. He has multiple new areas to the left foot and leg. The left dorsal foot without much improvement. Wound present between left great webspace and 2nd toe. Erythema and edema present right leg. Right LE ultrasound obtained on 10/10/18 was negative for DVT. 10/23/2018; Left buttock is closed over. Still dry macerated skin but there is no open wound. I suspect this is chronic pressure/moisture Left lateral calf is quite a bit worse than when I saw this last. There is clearly drainage here he has macerated skin into the left plantar heel. We will change the primary dressing to alginate Left dorsal foot has some improvement in overall wound area. Still using clindamycin and silver alginate Right dorsal foot about the same as the left using clindamycin and silver alginate The erythema in the right leg has resolved. He is DVT  rule out was negative Left heel pressure area required debridement although the wound is smaller and the surface is health 10/26/2018 The patient came back in for his nurse check today predominantly because of the drainage coming out of the left lateral leg with a recent reopening of his original wound on the left lateral calf. He comes in today with a large amount of surrounding erythema around the wound extending from the calf into the ankle and even in the area on the dorsal foot. He is not systemically unwell. He is not febrile. Nevertheless this looks like cellulitis. We have been using silver alginate to the area. I changed him to a regular visit and I am going to prescribe him doxycycline. The rationale here is a long list of medication intolerances and a history of MRSA. I did not see anything that I thought would provide a valuable culture 10/30/2018 Follow-up from his appointment 4 days ago with really an extensive area of cellulitis in the left calf left lateral ankle and left dorsal foot. I put him on doxycycline. He has a long list of medication allergies  which are true allergy reactions. 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 In any case he is a lot better today. The erythema especially in the anterior and lateral left calf is better. He still has left ankle erythema. He also is complaining about increasing edema in the right leg we have only been using Kerlix Coban and he has been doing the wraps at home. Finally he has a spotty rash on the medial part of his upper left calf which looks like folliculitis or perhaps wrap occlusion type injury. Small superficial macules not pustules 11/06/18 patient arrives today with again a considerable degree of erythema around the wound on the left lateral calf extending into the dorsal ankle and dorsal foot. This is a lot worse than when I saw this last week. He is on doxycycline really with not a lot of improvement. He has not been systemically unwell Wounds on the; left heel actually looks improved. Original area on the left foot and proximity to the first and second toes looks about the same. He has superficial areas on the dorsal foot, anterior calf and then the reopening of his original wound on the left lateral calf which looks about the same The only area he has on the right is the dorsal webspace first and second which is smaller. He has a large area of dry erythematous skin on the left buttock small open area here. 11/13/2018; the patient arrives in much better condition. The erythema around the wound on the left lateral calf is a lot better. Not sure whether this was the clindamycin or the TCA and ketoconazole or just in the improvement in edema control [stasis dermatitis]. In any case this is a lot better. The area on the left heel is very small and just about resolved using silver collagen we have been using silver alginate to the areas on his dorsal feet 11/20/2018; his wounds include the left lateral calf, left heel, dorsal  aspects of both feet just proximal to the first second webspace. He is stable to slightly improved. I did not think any changes to his dressings were going to be necessary 11/27/2018 he has a reopening on the left buttock which is surrounded by what looks like tinea or perhaps some other form of dermatitis. The area on the left dorsal foot has  some erythema around it I have marked this area but I am not sure whether this is cellulitis or not. Left heel is not closed. Left calf the reopening is really slightly longer and probably worse 1/13; in general things look better and smaller except for the left dorsal foot. Area on the left heel is just about closed, left buttock looks better only a small wound remains in the skin looks better [using Lotrisone] 1/20; the area on the left heel only has a few remaining open areas here. Left lateral calf about the same in terms of size, left dorsal foot slightly larger right lateral foot still not closed. The area on the left buttock has no open wound and the surrounding skin looks a lot better 1/27; the area on the left heel is closed. Left lateral calf better but still requiring extensive debridements. The area on his left buttock is closed. He still has the open areas on the left dorsal foot which is slightly smaller in the right foot which is slightly expanded. We have been using Iodoflex on these areas as well 2/3; left heel is closed. Left lateral calf still requiring debridement using Iodoflex there is no open area on his left buttock however he has dry scaly skin over a large area of this. Not really responding well to the Lotrisone. Finally the areas on his dorsal feet at the level of the first second webspace are slightly smaller on the right and about the same on the left. Both of these vigorously debrided with Anasept and gauze 2/10; left heel remains closed he has dry erythematous skin over the left buttock but there is no open wound here. Left  lateral leg has come in and with. Still requiring debridement we have been using Iodoflex here. Finally the area on the left dorsal foot and right dorsal foot are really about the same extremely dry callused fissured areas. He does not yet have a dermatology appointment 2/17; left heel remains closed. He has a new open area on the left buttock. The area on the left lateral calf is bigger longer and still covered in necrotic debris. No major change in his foot areas bilaterally. I am awaiting for a dermatologist to look on this. We have been using ketoconazole I do not know that this is been doing any good at all. 2/24; left heel remains closed. The left buttock wound that was new reopening last week looks better. The left lateral calf appears better also although still requires debridement. The major area on his foot is the left first second also requiring debridement. We have been putting Prisma on all wounds. I do not believe that the ketoconazole has done too much good for his feet. He will use Lotrisone I am going to give him a 2-week course of terbinafine. We still do not have a dermatology appointment 3/2 left heel remains closed however there is skin over bone in this area I pointed this out to him today. The left buttock wound is epithelialized but still does not look completely stable. The area on the left leg required debridement were using silver collagen here. With regards to his feet we changed to Lotrisone last week and silver alginate. 3/9; left heel remains closed. Left buttock remains closed. The area on the right foot is essentially closed. The left foot remains unchanged. Slightly smaller on the left lateral calf. Using silver collagen to both of these areas 3/16-Left heel remains closed. Area on right foot is closed. Left  lateral calf above the lateral malleolus open wound requiring debridement with easy bleeding. Left dorsal wound proximal to first toe also debrided. Left  ischial area open new. Patient has been using Prisma with wrapping every 3 days. Dermatology appointment is apparently tomorrow.Patient has completed his terbinafine 2-week course with some apparent improvement according to him, there is still flaking and dry skin in his foot on the left 3/23; area on the right foot is reopened. The area on the left anterior foot is about the same still a very necrotic adherent surface. He still has the area on the left leg and reopening is on the left buttock. He apparently saw dermatology although I do not have a note. According to the patient who is usually fairly well informed they did not have any good ideas. Put him on oral terbinafine which she is been on before. 3/30; using silver collagen to all wounds. Apparently his dermatologist put him on doxycycline and rifampin presumably some culture grew staph. I do not have this result. He remains on terbinafine although I have used terbinafine on him before 4/6; patient has had a fairly substantial reopening on the right foot between the first and second toes. He is finished his terbinafine and I believe is on doxycycline and rifampin still as prescribed by dermatology. We have been using silver collagen to all his wounds although the patient reports that he thinks silver alginate does better on the wounds on his buttock. 4/13; the area on his left lateral calf about the same size but it did not require debridement. Left dorsal foot just proximal to the webspace between the first and second toes is about the same. Still nonviable surface. I note some superficial bronze discoloration of the dorsal part of his foot Right dorsal foot just proximal to the first and second toes also looks about the same. I still think there may be the same discoloration I noted above on the left Left buttock wound looks about the same 4/20; left lateral calf appears to be gradually contracting using silver collagen. He remains on  erythromycin empiric treatment for possible erythrasma involving his digital spaces. The left dorsal foot wound is debrided of tightly adherent necrotic debris and really cleans up quite nicely. The right area is worse with expansion. I did not debride this it is now over the base of the second toe The area on his left buttock is smaller no debridement is required using silver collagen 5/4; left calf continues to make good progress. He arrives with erythema around the wounds on his dorsal foot which even extends to the plantar aspect. Very concerning for coexistent infection. He is finished the erythromycin I gave him for possible erythrasma this does not seem to have helped. The area on the left foot is about the same base of the dorsal toes Is area on the buttock looks improved on the left 5/11; left calf and left buttock continued to make good progress. Left foot is about the same to slightly improved. Major problem is on the right foot. He has not had an x-ray. Deep tissue culture I did last week showed both Enterobacter and E. coli. I did not change the doxycycline I put him on empirically although neither 1 of these were plated to doxycycline. He arrives today with the erythema looking worse on both the dorsal and plantar foot. Macerated skin on the bottom of the foot. he has not been systemically unwell 5/18-Patient returns at 1 week, left calf wound appears  to be making some progress, left buttock wound appears slightly worse than last time, left foot wound looks slightly better, right foot redness is marginally better. X-ray of both feet show no air or evidence of osteomyelitis. Patient is finished his Omnicef and terbinafine. He continues to have macerated skin on the bottom of the left foot as well as right 5/26; left calf wound is better, left buttock wound appears to have multiple small superficial open areas with surrounding macerated skin. X-rays that I did last time showed no  evidence of osteomyelitis in either foot. He is finished cefdinir and doxycycline. I do not think that he was on terbinafine. He continues to have a large superficial open area on the right foot anterior dorsal and slightly between the first and second toes. I did send him to dermatology 2 months ago or so wondering about whether they would do a fungal scraping. I do not believe they did but did do a culture. We have been using silver alginate to the toe areas, he has been using antifungals at home topically either ketoconazole or Lotrisone. We are using silver collagen on the left foot, silver alginate on the right, silver collagen on the left lateral leg and silver alginate on the left buttock 6/1; left buttock area is healed. We have the left dorsal foot, left lateral leg and right dorsal foot. We are using silver alginate to the areas on both feet and silver collagen to the area on his left lateral calf 6/8; the left buttock apparently reopened late last week. He is not really sure how this happened. He is tolerating the terbinafine. Using silver alginate to all wounds 6/15; left buttock wound is larger than last week but still superficial. Came in the clinic today with a report of purulence from the left lateral leg I did not identify any infection Both areas on his dorsal feet appear to be better. He is tolerating the terbinafine. Using silver alginate to all wounds 6/22; left buttock is about the same this week, left calf quite a bit better. His left foot is about the same however he comes in with erythema and warmth in the right forefoot once again. Culture that I gave him in the beginning of May showed Enterobacter and E. coli. I gave him doxycycline and things seem to improve although neither 1 of these organisms was specifically plated. 6/29; left buttock is larger and dry this week. Left lateral calf looks to me to be improved. Left dorsal foot also somewhat improved right foot  completely unchanged. The erythema on the right foot is still present. He is completing the Ceftin dinner that I gave him empirically [see discussion above.) 7/6 - All wounds look to be stable and perhaps improved, the left buttock wound is slightly smaller, per patient bleeds easily, completed ceftin, the right foot redness is less, he is on terbinafine 7/13; left buttock wound about the same perhaps slightly narrower. Area on the left lateral leg continues to narrow. Left dorsal foot slightly smaller right foot about the same. We are using silver alginate on the right foot and Hydrofera Blue to the areas on the left. Unna boot on the left 2 layer compression on the right 7/20; left buttock wound absolutely the same. Area on lateral leg continues to get better. Left dorsal foot require debridement as did the right no major change in the 7/27; left buttock wound the same size necrotic debris over the surface. The area on the lateral leg is  closed once again. His left foot looks better right foot about the same although there is some involvement now of the posterior first second toe area. He is still on terbinafine which I have given him for a month, not certain a centimeter major change 06/25/19-All wounds appear to be slightly improved according to report, left buttock wound looks clean, both foot wounds have minimal to no debris the right dorsal foot has minimal slough. We are using Hydrofera Blue to the left and silver alginate to the right foot and ischial wound. 8/10-Wounds all appear to be around the same, the right forefoot distal part has some redness which was not there before, however the wound looks clean and small. Ischial wound looks about the same with no changes 8/17; his wound on the left lateral calf which was his original chronic venous insufficiency wound remains closed. Since I last saw him the areas on the left dorsal foot right dorsal foot generally appear better but  require debridement. The area on his left initial tuberosity appears somewhat larger to me perhaps hyper granulated and bleeds very easily. We have been using Hydrofera Blue to the left dorsal foot and silver alginate to everything else 8/24; left lateral calf remains closed. The areas on his dorsal feet on the webspace of the first and second toes bilaterally both look better. The area on the left buttock which is the pressure ulcer stage II slightly smaller. I change the dressing to Hydrofera Blue to all areas 8/31; left lateral calf remains closed. The area on his dorsal feet bilaterally look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 inflammation. The wound on the left leg looks somewhat better. Is less surface slough 01/27/17; erythema is back to the chronic venous inflammation. The wound on the left leg is somewhat better. I am back to the point where I like to try an Apligraf once again 02/10/17; slight improvement in wound dimensions. Apligraf #2. He is completing his doxycycline 02/14/17; patient arrives today having completed doxycycline  last Thursday. This was supposed to be a nurse visit however once again he hasn't tense erythema from the medial part of his wound extending over the lower leg. Also erythema in his foot this is roughly in the same distribution as last time. He has baseline chronic venous inflammation however this is a lot worse than the baseline I have learned to accept the on him is baseline inflammation 02/24/17- patient is here for follow-up evaluation. He is tolerating compression therapy. His voicing no complaints or concerns he is here anticipating an Apligraf 03/03/17; he arrives today with an adherent necrotic surface. I don't think this is surface is going to be amenable for Apligraf's. The erythema around his wound and on the left dorsal foot has resolved he is off antibiotics 03/10/17; better-looking surface today. I don't think he can tolerate Apligraf's. He tells me he had a wound VAC after a skin graft years ago to this area and they had difficulty with a seal. The erythema continues to be stable around this some degree of chronic venous inflammation but he also has recurrent cellulitis. We have been using Iodoflex 03/17/17; continued improvement in the surface and may be small changes in dimensions. Using Iodoflex which seems the only thing that will control his surface 03/24/17- He is here for follow up evaluation of his LLE lateral ulceration and ulcer to right dorsal foot/toe space. He is voicing no complaints or concerns, He is tolerating compression wrap. 03/31/17 arrives today with a much healthier looking wound on the left lower extremity. We have been using Iodoflex for a prolonged period of time which has for the first time prepared and adequate looking wound bed although we have not had much in the way of wound dimension improvement. He also has a small wound between the first and second toe on the right 04/07/17; arrives today with a healthy-looking wound bed and at least the top 50% of this  wound appears to be now her. No debridement was required I have changed him to [facility] last week after prolonged Iodoflex. He did not do well with Apligraf's. We've had a re-opening between the first and second toe on the right 04/14/17; arrives today with a healthier looking wound bed contractions and the top 50% of this wound and some on the lesser 50%. Wound bed appears healthy. The area between the first and second toe on the right still remains problematic 04/21/17; continued very gradual improvement. Using [facility] 04/28/17; continued very gradual improvement in the left lateral leg venous insufficiency wound. His periwound erythema is very mild. We have been using Hydrofera Blue. Wound is making progress especially in the superior 50% 05/05/17; he continues to have very gradual improvement in the left lateral venous insufficiency wound. Both in terms with an length  rings are improving. I debrided this every 2 weeks with #5 curet and we have been using Hydrofera Blue and again making good progress With regards to the wounds between his right first and second toe which I thought might of been tinea pedis he is not making as much progress very dry scaly skin over the area. Also the area at the base of the left first and second toe in a similar condition 05/12/17; continued gradual improvement in the refractory left lateral venous insufficiency wound on the left. Dimension smaller. Surface still requiring debridement using Hydrofera Blue 05/19/17; continued gradual improvement in the refractory left lateral venous ulceration. Careful inspection of the wound bed underlying rumination suggested some degree of epithelialization over the surface no debridement indicated. Continue Hydrofera Blue difficult areas between his toes first and third on the left than first and second on the right. I'm going to change to silver alginate from silver collagen. Continue ketoconazole as I suspect  underlying tinea pedis 05/26/17; left lateral leg venous insufficiency wound. We've been using Hydrofera Blue. I believe that there is expanding epithelialization over the surface of the wound albeit not coming from the wound circumference. This is a bit of an odd situation in which the epithelialization seems to be coming from the surface of the wound rather than in the exact circumference. There is still small open areas mostly along the lateral margin of the wound. ooHe has unchanged areas between the left first and second and the right first second toes which I been treating for tenia pedis 06/02/17; left lateral leg venous insufficiency wound. We have been using Hydrofera Blue. Somewhat smaller from the wound circumference. The surface of the wound remains a bit on it almost epithelialized sedation in appearance. I use an open curette today debridement in the surface of all of this especially the edges ooSmall open wounds remaining on the dorsal right first and second toe interspace and the plantar left first second toe and her face on the left 06/09/17; wound on the left lateral leg continues to be smaller but very gradual and very dry surface using Hydrofera Blue 06/16/17 requires weekly debridements now on the left lateral leg although this continues to contract. I changed to silver collagen last week because of dryness of the wound bed. Using Iodoflex to the areas on his first and second toes/web space bilaterally 06/24/17; patient with history of paraplegia also chronic venous insufficiency with lymphedema. Has a very difficult wound on the left lateral leg. This has been gradually reducing in terms of with but comes in with a very dry adherent surface. High switch to silver collagen a week or so ago with hydrogel to keep the area moist. This is been refractory to multiple dressing attempts. He also has areas in his first and second toes bilaterally in the anterior and posterior web space.  I had been using Iodoflex here after a prolonged course of silver alginate with ketoconazole was ineffective [question tinea pedis] 07/14/17; patient arrives today with a very difficult adherent material over his left lateral lower leg wound. He also has surrounding erythema and poorly controlled edema. He was switched his Santyl last visit which the nurses are applying once during his doctor visit and once on a nurse visit. He was also reduced to 2 layer compression I'm not exactly sure of the issue here. 07/21/17; better surface today after 1 week of Iodoflex. Significant cellulitis that we treated last week also better. [Doxycycline] 07/28/17 better surface today with now  2 weeks of Iodoflex. Significant cellulitis treated with doxycycline. He has now completed the doxycycline and he is back to his usual degree of chronic venous inflammation/stasis dermatitis. He reminds me he has had ablations surgery here 08/04/17; continued improvement with Iodoflex to the left lateral leg wound in terms of the surface of the wound although the dimensions are better. 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 Problematic areas on the plantar aspect of the first second toe web space on the left and the dorsal aspect of the first second toe web space on the right. At one point I felt these were probably related to chronic fungal infections in treated him aggressively for this although we have not made any improvement here. 08/11/17; left lateral leg. Surface continues to improve with the Iodoflex although we are not seeing much improvement in overall wound dimensions. Areas on his plantar left foot and right foot show no improvement. In fact the right foot looks somewhat worse 08/18/17; left lateral leg. We changed to [facility] Blue last week after a prolonged course of Iodoflex which helps get the surface better. It appears that the wound with  is improved. Continue with difficult areas on the left dorsal first second and plantar first second on the right 09/01/17; patient arrives in clinic today having had a temperature of 103 yesterday. He was seen in the ER and [facility]. The patient was concerned he could have cellulitis again in the right leg however they diagnosed him with a UTI and he is now on Keflex. He has a history of cellulitis which is been recurrent and difficult but this is been in the left leg, in the past 5 use doxycycline. He does in and out catheterizations at home which are risk factors for UTI 09/08/17; patient will be completing his Keflex this weekend. The erythema on the left leg is considerably better. He has a new wound today on the medial part of the right leg small superficial almost looks like a skin tear. He has worsening of the area on the right dorsal first and second toe. His major area on the left lateral leg is better. Using Hydrofera Blue on all areas 09/15/17; gradual reduction in width on the long wound in the left lateral leg. No debridement required. He also has wounds on the plantar aspect of his left first second toe web space and on the dorsal aspect of the right first second toe web space. 09/22/17; there continues to be very gradual improvements in the dimensions of the left lateral leg wound. He hasn't round erythematous spot with might be pressure on his wheelchair. There is no evidence obviously of infection no purulence no warmth ooHe has a dry scaled area on the plantar aspect of the left first second toe ooImproved area on the dorsal right first second toe. 09/29/17; left lateral leg wound continues to improve in dimensions mostly with an is still a fairly long but increasingly narrow wound. ooHe has a dry scaled area on the plantar aspect of his left first second toe web space ooIncreasingly concerning area on the dorsal right first second toe. In fact I am concerned today about  possible cellulitis around this wound. The areas extending up his second toe and although there is deformities here almost appears to abut on the nailbed. 10/06/17; left lateral leg wound continues to make very gradual progress. Tissue culture I did from the right first second toe dorsal foot  last time grew MRSA and enterococcus which was vancomycin sensitive. This was not sensitive to clindamycin or doxycycline. He is allergic to Zyvox and sulfa we have therefore arrange for him to have dalvance infusion tomorrow. He is had this in the past and tolerated it well 10/20/17; left lateral leg wound continues to make decent progress. This is certainly reduced in terms of with there is advancing epithelialization.ooThe cellulitis in the right foot looks better although he still has a deep wound in the dorsal aspect of the first second toe web space. Plantar left first toe web space on the left I think is making some progress 10/27/17; left lateral leg wound continues to make decent progress. Advancing epithelialization.using Hydrofera Blue ooThe right first second toe web space wound is better-looking using silver alginate ooImprovement in the left plantar first second toe web space. Again using silver alginate 11/03/17 left lateral leg wound continues to make decent progress albeit slowly. Using Hydrofera Blue ooThe right per second toe web space continues to be a very problematic looking punched out wound. I obtained a piece of tissue for deep culture I did extensively treated this for fungus. It is difficult to imagine that this is a pressure area as the patient states other than going outside he doesn't really wear shoes at home ooThe left plantar first second toe web space looked fairly senescent. Necrotic edges. This required debridement oochange to Hydrofera Blue to all wound areas 11/10/17; left lateral leg wound continues to contract. Using Hydrofera Blue ooOn the right dorsal first  second toe web space dorsally. Culture I did of this area last week grew MRSA there is not an easy oral option in this patient was multiple antibiotic allergies or intolerances. This was only a rare culture isolate I'm therefore going to use Bactroban under silver alginate ooOn the left plantar first second toe web space. Debridement is required here. This is also unchanged 11/17/17; left lateral leg wound continues to contract using Hydrofera Blue this is no longer the major issue. ooThe major concern here is the right first second toe web space. He now has an open area going from dorsally to the plantar aspect. There is now wound on the inner lateral part of the first toe. Not a very viable surface on this. There is erythema spreading medially into the forefoot. ooNo major change in the left first second toe plantar wound 11/24/17; left lateral leg wound continues to contract using Hydrofera Blue. Nice improvement today ooThe right first second toe web space all of this looks a lot less angry than last week. I have given him clindamycin and topical Bactroban for MRSA and terbinafine for the possibility of underlining tinea pedis that I could not control with ketoconazole. Looks somewhat better ooThe area on the plantar left first second toe web space is weeping with dried debris around the wound 12/01/17; left lateral leg wound continues to contract he Hydrofera Blue. It is becoming thinner in terms of with nevertheless it is making good improvement. ooThe right first second toe web space looks less angry but still a large necrotic-looking wounds starting on the plantar aspect of the right foot extending between the toes and now extensively on the base of the right second toe. I gave him clindamycin and topical Bactroban for MRSA anterior benefiting for the possibility of underlying tinea pedis. Not looking better today ooThe area on the left first/second toe looks better. Debrided of  necrotic debris 12/05/17* the patient was worked in urgently today  because over the weekend he found blood on his incontinence bad when he woke up. He was found to have an ulcer by his wife who does most of his wound care. He came in today for [place] to look at this. He has not had a history of wounds in his buttocks in spite of his paraplegia. 12/08/17; seen in follow-up today at his usual appointment. He was seen earlier this week and found to have a new wound on his buttock. We also follow him for wounds on the left lateral leg, left first second toe web space and right first second toe web space 12/15/17; we have been using Hydrofera Blue to the left lateral leg which has improved. The right first second toe web space has also improved. Left first second toe web space plantar aspect looks stable. The left buttock has worsened using Santyl. Apparently the buttock has drainage 12/22/17; we have been using Hydrofera Blue to the left lateral leg which continues to improve now 2 small wounds separated by normal skin. He tells [place] he had a fever up to 100 yesterday he is prone to UTIs but has not noted anything different. He does in and out catheterizations. The area between the first and second toes today does not look good necrotic surface covered with what looks to be purulent drainage and erythema extending into the third toe. I had gotten this to something that I thought look better last time however it is not look good today. He also has a necrotic surface over the buttock wound which is expanded. I thought there might be infection under here so I removed a lot of the surface with a #5 curet though nothing look like it really needed culturing. He is been using Santyl to this area 12/27/17; his original wound on the left lateral leg continues to improve using Hydrofera Blue. I gave him samples of Baxdella although he was unable to take them out of fear for an allergic reaction ["lump in his throat"].the  culture I did of the purulent drainage from his second toe last week showed both enterococcus and a set Enterobacter I was also concerned about the erythema on the bottom of his foot although paradoxically although this looks somewhat better today. 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 We've been using silver alginate here. They came up today that he sleeps in a recliner, I'm not sure why but I asked him to stop this 01/03/18; his original wound we've been using Hydrofera Blue is now separated into 2 areas. ooUlcer on his left buttock is better he is off the recliner and sleeping in bed ooFinally both wound areas between his first and second toes also looks some better 01/10/18; his original wound on the left lateral leg is now separated into 2 wounds we've been using Hydrofera Blue ooUlcer on his left buttock has some drainage. There is a small probing site going into muscle layer superiorly.using silver alginate -He arrives today with a deep tissue injury on the left heel ooThe wound on the dorsal aspect of his first second toe on the left looks a lot betterusing silver alginate ketoconazole ooThe area on the first second toe web space on the right also looks a lot bette 01/17/18; his original wound on the left lateral leg continues to progress using Hydrofera Blue ooUlcer on his left buttock also is smaller surface healthier except for a small  probing site going into the muscle layer superiorly. 2.4 cm of tunneling in this area ooDTI on his left heel we have only been offloading. Looks better than last week no threatened open no evidence of infection oothe wound on the dorsal aspect of the first second toe on the left continues to look like it's regressing we have only been using silver alginate and terbinafine orally ooThe area in the first second toe web space on the right also looks  to be a lot better using silver alginate and terbinafine I think this was prompted by tinea pedis 01/31/18; the patient was hospitalized in [place] last week apparently for a complicated UTI. He was discharged on cefepime he does in and out catheterizations. In the hospital he was discovered M I don't mild elevation of AST and ALTs and the terbinafine was stopped.predictably the pressure ulcer on his buttock looks betterusing silver alginate. The area on the left lateral leg also is better using Hydrofera Blue. The area between the first and second toes on the left better. First and second toes on the right still substantial but better. Finally the DTI on the left heel has held together and looks like it's resolving 02/07/18-he is here in follow-up evaluation for multiple ulcerations. He has new injury to the lateral aspect of the last issue a pressure ulcer, he states this is from adhesive removal trauma. He states he has tried multiple adhesive products with no success. All other ulcers appear stable. The left heel DTI is resolving. We will continue with same treatment plan and follow-up next week. 02/14/18; follow-up for multiple areas. ooHe has a new area last week on the lateral aspect of his pressure ulcer more over the posterior trochanter. The original pressure ulcer looks quite stable has healthy granulation. 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 Almost fully epithelialized on the original superior area using Hydrofera Blue ooDTI on the left heel has peeled off this week to reveal a small superficial wound under denuded skin and subcutaneous tissue ooBoth areas between the first and second toes look better including nothing open on the left 02/21/18; ooThe patient's wounds on his left ischial tuberosity and posterior left greater trochanter actually looked better. He has a  large area of irritation around the area which I think is contact dermatitis. I am doubtful that this is fungal ooHis original wound on the left lateral calf continues to improve we have been using Hydrofera Blue ooThere is no open area in the left first second toe web space although there is a lot of thick callus ooThe DTI on the left heel required debridement today of necrotic surface eschar and subcutaneous tissue using silver alginate ooFinally the area on the right first second toe webspace continues to contract using silver alginate and ketoconazole 02/28/18 ooLeft ischial tuberosity wounds look better using silver alginate. ooOriginal wound on the left calf only has one small open area left using Hydrofera Blue ooDTI on the left heel required debridement mostly removing skin from around this wound surface. Using silver alginate ooThe areas on the right first/second toe web space using silver alginate and ketoconazole 03/08/18 on evaluation today patient appears to be doing decently well as best I can tell in regard to his wounds. This is the first time that I have seen him as he generally is followed by Dr. Dellia Nims. With that being said none of his wounds appear  to be infected he does have an area where there is some skin covering what appears to be a new wound on the left dorsal surface of his great toe. This is right at the nail bed. With that being said I do believe that debrided away some of the excess skin can be of benefit in this regard. Otherwise he has been tolerating the dressing changes without complication. 03/14/18; patient arrives today with the multiplicity of wounds that we are following. He has not been systemically unwell ooOriginal wound on the left lateral calf now only has 2 small open areas we've been using Hydrofera Blue which should continue ooThe deep tissue injury on the left heel requires debridement today. We've been using silver alginate ooThe left  first second toe and the right first second toe are both are reminiscence what I think was tinea pedis. Apparently some of the callus Surface between the toes was removed last week when it started draining. ooPurulent drainage coming from the wound on the ischial tuberosity on the left. 03/21/18-He is here in follow-up evaluation for multiple wounds. 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 He tolerated debridement. The only change to last week's recommendations is to discontinue antifungal cream between toes. He will follow-up next week 03/28/18; following up for multiple wounds;Concern this week is streaking redness and swelling in the right foot. He is going to need antibiotics for this. 03/31/18; follow-up for right foot cellulitis. Streaking redness and swelling in the right foot on 03/28/18. 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 I put him on clindamycin 300 mg every 6 and brought him in for a quick check. He has an open wound between his first and second toes on the right foot as a potential source. 04/04/18; ooRight foot cellulitis is resolving he is completing clindamycin. This is truly good news ooLeft lateral calf wound which is initial wound only has one small open area inferiorly this is close to healing out. He has compression stockings. We will use Hydrofera Blue right down to the epithelialization of this ooNonviable surface on the left heel which was initially pressure with a DTI. We've been using Hydrofera Blue. I'm going to switch this back to silver alginate ooLeft first second toe/tinea pedis this looks better using silver alginate ooRight first second toe tinea pedis using silver alginate ooLarge pressure ulcers on theLeft ischial tuberosity. Small wound here Looks better. I am uncertain about the surface over the large wound. Using silver alginate 04/11/18; ooCellulitis  in the right foot is resolved ooLeft lateral calf wound which was his original wounds still has 2 tiny open areas remaining this is just about closed ooNonviable surface on the left heel is better but still requires debridement ooLeft first second toe/tinea pedis still open using silver alginate ooRight first second toe wound tinea pedis I asked him to go back to using ketoconazole and silver alginate ooLarge pressure ulcers on the left ischial tuberosity this shear injury here is resolved. Wound is smaller. No evidence of infection using silver alginate 04/18/18; ooPatient arrives with an intense area of cellulitis in the right mid lower calf extending into the right heel area. Bright red and warm. Smaller area on the left anterior leg. He has a significant history of MRSA. He will definitely need antibioticsoodoxycycline ooHe now has 2 open areas on the left ischial tuberosity the original large wound and now a satellite area which I think was above  his initial satellite areas. Not a wonderful surface on this satellite area surrounding erythema which looks like pressure related. ooHis left lateral calf wound again his original wound is just about closed ooLeft heel pressure injury still requiring debridement ooLeft first second toe looks a lot better using silver alginate ooRight first second toe also using silver alginate and ketoconazole cream also looks better 04/20/18; the patient was worked in early today out of concerns with his cellulitis on the right leg. I had started him on doxycycline. This was 2 days ago. His wife was concerned about the swelling in the area. Also concerned about the left buttock. He has not been systemically unwell no fever chills. No nausea vomiting or diarrhea 04/25/18; the patient's left buttock wound is continued to deteriorate he is using Hydrofera Blue. He is still completing clindamycin for the cellulitis on the right leg although all of this looks  better. 05/02/18 ooLeft buttock wound still with a lot of drainage and a very tightly adherent fibrinous necrotic surface. He has a deeper area superiorly ooThe left lateral calf wound is still closed ooDTI wound on the left heel necrotic surface especially the circumference using Iodoflex ooAreas between his left first second toe and right first second toe both look better. Dorsally and the right first second toe he had a necrotic surface although at smaller. In using silver alginate and ketoconazole. I did a culture last week which was a deep tissue culture of the reminiscence of the open wound on the right first second toe dorsally. This grew a few Acinetobacter and a few methicillin-resistant staph aureus. Nevertheless the area actually this week looked better. I didn't feel the need to specifically address this at least in terms of systemic antibiotics. 05/09/18; wounds are measuring larger more drainage per our intake. We are using Santyl covered with alginate on the large superficial buttock wounds, Iodosorb on the left heel, ketoconazole and silver alginate to the dorsal first and second toes bilaterally. 05/16/18; ooThe area on his left buttock better in some aspects although the area superiorly over the ischial tuberosity required an extensive debridement.using Santyl ooLeft heel appears stable. Using Iodoflex ooThe areas between his first and second toes are not bad however there is spreading erythema up the dorsal aspect of his left foot this looks like cellulitis again. He is insensate the erythema is really very brilliant.o Erysipelas He went to see an allergist days ago because he was itching part of this he had lab work done. This showed a white count of 15.1 with 70% neutrophils. Hemoglobin of 11.4 and a platelet count of 659,000. Last white count we had in Epic was a 2-1/2 years ago which was 25.9 but he was ill at the time. He was able to show me some lab work that was  done by his primary physician the pattern is about the same. 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 But prompted me to go ahead and do x-rays of both feet and the pelvis rule out osteomyelitis. He also had a comprehensive metabolic panel this was reasonably normal his albumin was 3.7 liver function tests BUN/creatinine all normal 05/23/18; x-rays of both his feet from last week were negative for underlying pulmonary abnormality. The x-ray of his pelvis however showed mild irregularity in the left ischial which may represent some early osteomyelitis. The wound in the left ischial continues to get deeper clearly now exposed muscle. Each week necrotic surface material  over this area. Whereas the rest of the wounds do not look so bad. ooThe left ischial wound we have been using Santyl and calcium alginate ooTo the left heel surface necrotic debris using Iodoflex ooThe left lateral leg is still healed ooAreas on the left dorsal foot and the right dorsal foot are about the same. There is some inflammation on the left which might represent contact dermatitis, fungal dermatitis I am doubtful cellulitis although this looks better than last week 05/30/18; CT scan done at Hospital did not show any osteomyelitis or abscess. Suggested the possibility of underlying cellulitis although I don't see a lot of evidence of this at the bedside ooThe wound itself on the left buttock/upper thigh actually looks somewhat better. No debridement ooLeft heel also looks better no debridement continue Iodoflex ooBoth dorsal first second toe spaces appear better using Lotrisone. Left still required debridement 06/06/18; ooIntake reported some purulent looking drainage from the left gluteal wound. Using Santyl and calcium alginate ooLeft heel looks better although still a nonviable surface requiring debridement ooThe left dorsal foot first/second webspace actually  expanding and somewhat deeper. I may consider doing a shave biopsy of this area ooRight dorsal foot first/second webspace appears stable to improved. Using Lotrisone and silver alginate to both these areas 06/13/18 ooLeft gluteal surface looks better. Now separated in the 2 wounds. No debridement required. Still drainage. We'll continue silver alginate ooLeft heel continues to look better with Iodoflex continue this for at least another week ooOf his dorsal foot wounds the area on the left still has some depth although it looks better than last week. We've been using Lotrisone and silver alginate 06/20/18 ooLeft gluteal continues to look better healthy tissue ooLeft heel continues to look better healthy granulation wound is smaller. He is using Iodoflex and his long as this continues continue the Iodoflex ooDorsal right foot looks better unfortunately dorsal left foot does not. There is swelling and erythema of his forefoot. He had minor trauma to this several days ago but doesn't think this was enough to have caused any tissue injury. Foot looks like cellulitis, we have had this problem before 06/27/18 on evaluation today patient appears to be doing a little worse in regard to his foot ulcer. 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 Both of which would really be his only options for treating this infection. In the past he has been given and effusion of Orbactiv. This is done very well for him in the past again it's one time dosing IV antibiotic therapy. Subsequently I do believe this is something we're gonna need to see about doing at this point in time. Currently his other wounds seem to be doing somewhat better in my pinion I'm pretty happy in that regard. 07/03/18 on evaluation today patient's wounds actually appear to be doing fairly well. He  has been tolerating the dressing changes without complication. All in all he seems to be showing signs of improvement. In regard to the antibiotics he has been dealing with infectious disease since I saw him last week as far as getting this scheduled. In the end [facility]'s [facility] this coming Friday. In the meantime he has been continuing to perform the dressing changes in such as previous. There does not appear to be any evidence of infection  worsengin at this time. 07/10/18; ooSince I last saw this man 2 weeks ago things have actually improved. IV antibiotics of resulted in less forefoot erythema although there is still some present. He is not systemically unwell ooLeft buttock wounds o2 now have no depth there is increased epithelialization Using silver alginate ooLeft heel still requires debridement using Iodoflex ooLeft dorsal foot still with a sizable wound about the size of a border but healthy granulation ooRight dorsal foot still with a slitlike area using silver alginate 07/18/18; the patient's cellulitis in the left foot is improved in fact I think it is on its way to resolving. ooLeft buttock wounds o2 both look better although the larger one has hypertension granulation we've been using silver alginate ooLeft heel has some thick circumferential redundant skin over the wound edge which will need to be removed today we've been using Iodoflex ooLeft dorsal foot is still a sizable wound required debridement using silver alginate ooThe right dorsal foot is just about closed only a small open area remains here 07/25/18; left foot cellulitis is resolved ooLeft buttock wounds o2 both look better. Hyper-granulation on the major area ooLeft heel as some debris over the surface but otherwise looks a healthier wound. Using silver collagen ooRight dorsal foot is just about closed 07/31/18; arrives with our intake nurse worried  about purulent drainage from the buttock. We had hyper-granulation here last week ooHis buttock wounds o2 continue to look better ooLeft heel some debris over the surface but measuring smaller. ooRight dorsal foot unfortunately has openings between the toes ooLeft foot superficial wound looks less aggravated. 08/07/18 ooButtock wounds continue to look better although some of her granulation and the larger medial wound. silver alginate ooLeft heel continues to look a lot better.silver collagen ooLeft foot superficial wound looks less stable. Requires debridement. He has a new wound superficial area on the foot on the lateral dorsal foot. ooRight foot looks better using silver alginate without Lotrisone 08/14/2018; patient was in the ER last week diagnosed with a UTI. He is now on Cefpodoxime and Macrodantin. ooButtock wounds continued to be smaller. Using silver alginate ooLeft heel continues to look better using silver collagen ooLeft foot superficial wound looks as though it is improving ooRight dorsal foot area is just about healed. 08/21/2018; patient is completed his antibiotics for his UTI. ooHe has 2 open areas on the buttocks. There is still not closed although the surface looks satisfactory. Using silver alginate ooLeft heel continues to improve using silver collagen ooThe bilateral dorsal foot areas which are at the base of his first and second toes/possible tinea pedis are actually stable on the left but worse on the right. The area on the left required debridement of necrotic surface. After debridement I obtained a specimen for PCR culture. ooThe right dorsal foot which is been just about healed last week is now reopened 08/28/2018; culture done on the left dorsal foot showed coag negative staph both staph epidermidis and Lugdunensis. I think this is worthwhile initiating systemic treatment. I will use doxycycline given his long list of allergies. The area on the  left heel slightly improved but still requiring debridement. ooThe large wound on the buttock is just about closed whereas the smaller one is larger. Using silver alginate in this area 09/04/2018; patient is completing his doxycycline for the left foot although this continues to be a very difficult wound area with very adherent necrotic debris. We are using silver alginate to all his wounds right foot left foot and  the small wounds on his buttock, silver collagen on the left heel. 09/11/2018; once again this patient has intense erythema and swelling of the left forefoot. Lesser degrees of erythema in the right foot. He has a long list of allergies and intolerances. I will reinstitute doxycycline. 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 Using silver alginate ooLeft heel also looks better using silver collagen ooUnfortunately both the areas on his feet look worse. The area on the left first second webspace is now gone through to the plantar part of his foot. The area on the left foot anteriorly is irritated with erythema and swelling in the forefoot. 09/25/2018 ooHis wound on the left plantar heel looks better. Using silver collagen ooThe area on the left buttock 2 small remnant areas. One is closed one is still open. Using silver alginate ooThe areas between both his first and second toes look worse. This in spite of long-standing antifungal therapy with ketoconazole and silver alginate which should have antifungal activity ooHe has small areas around his original wound on the left calf one is on the bottom of the original scar tissue and one superiorly both of these are small and superficial but again given wound history in this site this is worrisome 10/02/2018 ooLeft plantar heel continues to gradually contract using silver collagen ooLeft buttock wound is unchanged using silver alginate ooThe areas on his dorsal feet between his  first and second toes bilaterally look about the same. 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 Small open area here. We have been using silver alginate 10/09/2018; ooLeft plantar heel continues to look healthy and contract. No debridement is required ooLeft buttock slightly smaller with a tape injury wound just below which was new this week ooDorsal feet somewhat improved I have been using clindamycin ooLeft lateral looks lower extremity the actual open area looks worse although a lot of this is epithelialized. I am going to change to silver collagen today He has a lot more swelling in the right leg although this is not pitting not red and not particularly warm there is a lot of spasm in the right leg usually indicative of people with paralysis of some underlying discomfort. We have reviewed his vascular status from 2017 he had a left greater saphenous vein ablation. I wonder about referring him back to vascular surgery if the area on the left leg continues to deteriorate. 10/16/2018 in today for follow-up and management of multiple lower extremity ulcers. His left Buttock wound is much lower smaller and almost closed completely. The wound to the left ankle has began to reopen with Epithelialization and some adherent slough. He has multiple new areas to the left foot and leg. The left dorsal foot without much improvement. Wound present between left great webspace and 2nd toe. Erythema and edema present right leg. Right LE ultrasound obtained on 10/10/18 was negative for DVT. 10/23/2018; ooLeft buttock is closed over. Still dry macerated skin but there is no open wound. I suspect this is chronic pressure/moisture ooLeft lateral calf is quite a bit worse than when I saw this last. There is clearly drainage  here he has macerated skin into the left plantar heel. We will change the primary dressing to alginate ooLeft dorsal foot has some improvement in overall wound area.  Still using clindamycin and silver alginate ooRight dorsal foot about the same as the left using clindamycin and silver alginate ooThe erythema in the right leg has resolved. He is DVT rule out was negative ooLeft heel pressure area required debridement although the wound is smaller and the surface is health 10/26/2018 ooThe patient came back in for his nurse check today predominantly because of the drainage coming out of the left lateral leg with a recent reopening of his original wound on the left lateral calf. He comes in today with a large amount of surrounding erythema around the wound extending from the calf into the ankle and even in the area on the dorsal foot. He is not systemically unwell. He is not febrile. Nevertheless this looks like cellulitis. We have been using silver alginate to the area. I changed him to a regular visit and I am going to prescribe him doxycycline. The rationale here is a long list of medication intolerances and a history of MRSA. I did not see anything that I thought would provide a valuable culture 10/30/2018 ooFollow-up from his appointment 4 days ago with really an extensive area of cellulitis in the left calf left lateral ankle and left dorsal foot. I put him on doxycycline. He has a long list of medication allergies which are true allergy reactions. 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 In any case he is a lot better today. The erythema especially in the anterior and lateral left calf is better. He still has left ankle erythema. He also is complaining about increasing edema in the right leg we have only been using Kerlix Coban and he has been doing the wraps at home. Finally he has a spotty rash on the medial part of  his upper left calf which looks like folliculitis or perhaps wrap occlusion type injury. Small superficial macules not pustules 11/06/18 patient arrives today with again a considerable degree of erythema around the wound on the left lateral calf extending into the dorsal ankle and dorsal foot. This is a lot worse than when I saw this last week. He is on doxycycline really with not a lot of improvement. He has not been systemically unwell Wounds on the; left heel actually looks improved. Original area on the left foot and proximity to the first and second toes looks about the same. He has superficial areas on the dorsal foot, anterior calf and then the reopening of his original wound on the left lateral calf which looks about the same ooThe only area he has on the right is the dorsal webspace first and second which is smaller. ooHe has a large area of dry erythematous skin on the left buttock small open area here. 11/13/2018; the patient arrives in much better condition. The erythema around the wound on the left lateral calf is a lot better. Not sure whether this was the clindamycin or the TCA and ketoconazole or just in the improvement in edema control [stasis dermatitis]. In any case this is a lot better. The area on the left heel is very small and just about resolved using silver collagen we have been using silver alginate to the areas on his dorsal feet 11/20/2018; his wounds include the left lateral calf, left heel, dorsal aspects of both feet just proximal to the first second webspace. He is stable to slightly improved. I did not think any changes to his dressings were going to be necessary 11/27/2018 he  has a reopening on the left buttock which is surrounded by what looks like tinea or perhaps some other form of dermatitis. The area on the left dorsal foot has some erythema around it I have marked this area but I am not sure whether this is cellulitis or not. Left heel is not closed. Left  calf the reopening is really slightly longer and probably worse 1/13; in general things look better and smaller except for the left dorsal foot. Area on the left heel is just about closed, left buttock looks better only a small wound remains in the skin looks better [using Lotrisone] 1/20; the area on the left heel only has a few remaining open areas here. Left lateral calf about the same in terms of size, left dorsal foot slightly larger right lateral foot still not closed. The area on the left buttock has no open wound and the surrounding skin looks a lot better 1/27; the area on the left heel is closed. Left lateral calf better but still requiring extensive debridements. The area on his left buttock is closed. He still has the open areas on the left dorsal foot which is slightly smaller in the right foot which is slightly expanded. We have been using Iodoflex on these areas as well 2/3; left heel is closed. Left lateral calf still requiring debridement using Iodoflex there is no open area on his left buttock however he has dry scaly skin over a large area of this. Not really responding well to the Lotrisone. Finally the areas on his dorsal feet at the level of the first second webspace are slightly smaller on the right and about the same on the left. Both of these vigorously debrided with Anasept and gauze 2/10; left heel remains closed he has dry erythematous skin over the left buttock but there is no open wound here. Left lateral leg has come in and with. Still requiring debridement we have been using Iodoflex here. Finally the area on the left dorsal foot and right dorsal foot are really about the same extremely dry callused fissured areas. He does not yet have a dermatology appointment 2/17; left heel remains closed. He has a new open area on the left buttock. The area on the left lateral calf is bigger longer and still covered in necrotic debris. No major change in his foot areas  bilaterally. I am awaiting for a dermatologist to look on this. We have been using ketoconazole I do not know that this is been doing any good at all. 2/24; left heel remains closed. The left buttock wound that was new reopening last week looks better. The left lateral calf appears better also although still requires debridement. The major area on his foot is the left first second also requiring debridement. We have been putting Prisma on all wounds. I do not believe that the ketoconazole has done too much good for his feet. He will use Lotrisone I am going to give him a 2-week course of terbinafine. We still do not have a dermatology appointment 3/2 left heel remains closed however there is skin over bone in this area I pointed this out to him today. The left buttock wound is epithelialized but still does not look completely stable. The area on the left leg required debridement were using silver collagen here. With regards to his feet we changed to Lotrisone last week and silver alginate. 3/9; left heel remains closed. Left buttock remains closed. The area on the right foot is essentially closed.  The left foot remains unchanged. Slightly smaller on the left lateral calf. Using silver collagen to both of these areas 3/16-Left heel remains closed. Area on right foot is closed. Left lateral calf above the lateral malleolus open wound requiring debridement with easy bleeding. Left dorsal wound proximal to first toe also debrided. Left ischial area open new. Patient has been using Prisma with wrapping every 3 days. Dermatology appointment is apparently tomorrow.Patient has completed his terbinafine 2-week course with some apparent improvement according to him, there is still flaking and dry skin in his foot on the left 3/23; area on the right foot is reopened. The area on the left anterior foot is about the same still a very necrotic adherent surface. He still has the area on the left leg and  reopening is on the left buttock. He apparently saw dermatology although I do not have a note. According to the patient who is usually fairly well informed they did not have any good ideas. Put him on oral terbinafine which she is been on before. 3/30; using silver collagen to all wounds. Apparently his dermatologist put him on doxycycline and rifampin presumably some culture grew staph. I do not have this result. He remains on terbinafine although I have used terbinafine on him before 4/6; patient has had a fairly substantial reopening on the right foot between the first and second toes. He is finished his terbinafine and I believe is on doxycycline and rifampin still as prescribed by dermatology. We have been using silver collagen to all his wounds although the patient reports that he thinks silver alginate does better on the wounds on his buttock. 4/13; the area on his left lateral calf about the same size but it did not require debridement. ooLeft dorsal foot just proximal to the webspace between the first and second toes is about the same. Still nonviable surface. I note some superficial bronze discoloration of the dorsal part of his foot ooRight dorsal foot just proximal to the first and second toes also looks about the same. I still think there may be the same discoloration I noted above on the left ooLeft buttock wound looks about the same 4/20; left lateral calf appears to be gradually contracting using silver collagen. 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 The left dorsal foot wound is debrided of tightly adherent necrotic debris and really cleans up quite nicely. The right area is worse with expansion. I did not debride this it is now over the base of the second toe ooThe area on his left buttock is smaller no debridement is required using silver collagen 5/4; left calf continues to make good progress. ooHe arrives  with erythema around the wounds on his dorsal foot which even extends to the plantar aspect. Very concerning for coexistent infection. He is finished the erythromycin I gave him for possible erythrasma this does not seem to have helped. ooThe area on the left foot is about the same base of the dorsal toes ooIs area on the buttock looks improved on the left 5/11; left calf and left buttock continued to make good progress. Left foot is about the same to slightly improved. ooMajor problem is on the right foot. He has not had an x-ray. Deep tissue culture I did last week showed both Enterobacter and E. coli. I did not change the doxycycline I put him on empirically although neither 1 of these were plated to doxycycline. He arrives today with the erythema looking  worse on both the dorsal and plantar foot. Macerated skin on the bottom of the foot. he has not been systemically unwell 5/18-Patient returns at 1 week, left calf wound appears to be making some progress, left buttock wound appears slightly worse than last time, left foot wound looks slightly better, right foot redness is marginally better. X-ray of both feet show no air or evidence of osteomyelitis. Patient is finished his Omnicef and terbinafine. He continues to have macerated skin on the bottom of the left foot as well as right 5/26; left calf wound is better, left buttock wound appears to have multiple small superficial open areas with surrounding macerated skin. X-rays that I did last time showed no evidence of osteomyelitis in either foot. He is finished cefdinir and doxycycline. I do not think that he was on terbinafine. He continues to have a large superficial open area on the right foot anterior dorsal and slightly between the first and second toes. I did send him to dermatology 2 months ago or so wondering about whether they would do a fungal scraping. I do not believe they did but did do a culture. We have been using silver  alginate to the toe areas, he has been using antifungals at home topically either ketoconazole or Lotrisone. We are using silver collagen on the left foot, silver alginate on the right, silver collagen on the left lateral leg and silver alginate on the left buttock 6/1; left buttock area is healed. We have the left dorsal foot, left lateral leg and right dorsal foot. We are using silver alginate to the areas on both feet and silver collagen to the area on his left lateral calf 6/8; the left buttock apparently reopened late last week. He is not really sure how this happened. He is tolerating the terbinafine. Using silver alginate to all wounds 6/15; left buttock wound is larger than last week but still superficial. ooCame in the clinic today with a report of purulence from the left lateral leg I did not identify any infection ooBoth areas on his dorsal feet appear to be better. He is tolerating the terbinafine. Using silver alginate to all wounds 6/22; left buttock is about the same this week, left calf quite a bit better. His left foot is about the same however he comes in with erythema and warmth in the right forefoot once again. Culture that I gave him in the beginning of May showed Enterobacter and E. coli. I gave him doxycycline and things seem to improve although neither 1 of these organisms was specifically plated. 6/29; left buttock is larger and dry this week. Left lateral calf looks to me to be improved. Left dorsal foot also somewhat improved right foot completely unchanged. The erythema on the right foot is still present. He is completing the Ceftin dinner that I gave him empirically [see discussion above.) 7/6 - All wounds look to be stable and perhaps improved, the left buttock wound is slightly smaller, per patient bleeds easily, completed ceftin, the right foot redness is less, he is on terbinafine 7/13; left buttock wound about the same perhaps slightly narrower. Area on the left  lateral leg continues to narrow. Left dorsal foot slightly smaller right foot about the same. We are using silver alginate on the right foot and Hydrofera Blue to the areas on the left. Unna boot on the left 2 layer compression on the right 7/20; left buttock wound absolutely the same. Area on lateral leg continues to get better. Left dorsal  foot require debridement as did the right no major change in the 7/27; left buttock wound the same size necrotic debris over the surface. The area on the lateral leg is closed once again. His left foot looks better right foot about the same although there is some involvement now of the posterior first second toe area. He is still on terbinafine which I have given him for a month, not certain a centimeter major change 06/25/19-All wounds appear to be slightly improved according to report, left buttock wound looks clean, both foot wounds have minimal to no debris the right dorsal foot has minimal slough. We are using Hydrofera Blue to the left and silver alginate to the right foot and ischial wound. 8/10-Wounds all appear to be around the same, the right forefoot distal part has some redness which was not there before, however the wound looks clean and small. Ischial wound looks about the same with no changes 8/17; his wound on the left lateral calf which was his original chronic venous insufficiency wound remains closed. Since I last saw him the areas on the left dorsal foot right dorsal foot generally appear better but require debridement. The area on his left initial tuberosity appears somewhat larger to me perhaps hyper granulated and bleeds very easily. We have been using Hydrofera Blue to the left dorsal foot and silver alginate to everything else 8/24; left lateral calf remains closed. The areas on his dorsal feet on the webspace of the first and second toes bilaterally both look better. The area on the left buttock which is the pressure ulcer stage II  slightly smaller. I change the dressing to Hydrofera Blue to all areas 8/31; left lateral calf remains closed. The area on his dorsal feet bilaterally look better. Using Hydrofera Blue. Still requiring debridement on the left foot. No change in the left buttock pressure ulcers however 9/14; left lateral calf remains closed. Dorsal feet look quite a bit better than 2 weeks ago. Flaking dry skin also a lot better with the ammonium lactate I gave him 2 weeks ago. The area on the left buttock is improved. He states that his Roho cushion developed a leak and he is getting a new one, in the interim he is offloading this vigorously 9/21; left calf remains closed. Left heel which was a possible DTI looks better this week. He had macerated tissue around the left dorsal foot right foot looks satisfactory and improved left buttock wound. I changed his dressings to his feet to silver alginate bilaterally. Continuing Hydrofera Blue on the left buttock. 9/28 left calf remains closed. Left heel did not develop anything [possible DTI] dry flaking skin on the left dorsal foot. Right foot looks satisfactory. Improved left buttock wound. We are using silver alginate on his feet Hydrofera Blue on the buttock. I have asked him to go back to the Lotrisone on his feet including the wounds and surrounding areas 10/5; left calf remains closed. The areas on the left and right feet about the same. A lot of this is epithelialized however debris over the remaining open areas. He is using Lotrisone and silver alginate. The area on the left buttock using Hydrofera Blue 10/26. Patient has been out for 3 weeks secondary to Covid concerns. He tested negative but I think his wife tested positive. He comes in today with the left foot substantially worse, right foot about the same. Even more concerning he states that the area on his left buttock closed over but then reopened and  is considerably deeper in one aspect than it was before  [stage III wound] 11/2; left foot really about the same as last week. Quarter sized wound on the dorsal foot just proximal to the first second toes. Surrounding erythema with areas of denuded epithelium. This is not really much different looking. Did not look like cellulitis this time however. ooRight foot area about the same.. We have been using silver alginate alginate on his toes ooLeft buttock still substantial irritated skin around the wound which I think looks somewhat better. We have been using Hydrofera Blue here. Objective Constitutional Vitals Time Taken: 7:54 AM, Height: 70 in, Weight: 216 lbs, BMI: 31, Temperature: 98 F, Pulse: 128 bpm, Respiratory Rate: 18 breaths/min, Blood Pressure: 144/86 mmHg. Integumentary (Hair, Skin) Wound #17 status is Open. Original cause of wound was Gradually Appeared. The wound is located on the Right,Dorsal Toe - Web between 1st and 2nd. The wound measures 2cm length x 3.4cm width x 0.1cm depth; 5.341cm^2 area and 0.534cm^3 volume. There is Fat Layer (Subcutaneous Tissue) Exposed exposed. There is no tunneling or undermining noted. There is a medium amount of serosanguineous drainage noted. The wound margin is distinct with the outline attached to the wound base. There is small (1-33%) pale granulation within the wound bed. There is a large (67-100%) amount of necrotic tissue within the wound bed including Adherent Slough. Wound #24 status is Open. Original cause of wound was Gradually Appeared. The wound is located on the Left,Dorsal Foot. The wound measures 2.3cm length x 3.1cm width x 0.2cm depth; 5.6cm^2 area and 1.12cm^3 volume. There is Fat Layer (Subcutaneous Tissue) Exposed exposed. There is no tunneling or undermining noted. There is a medium amount of serosanguineous drainage noted. The wound margin is distinct with the outline attached to the wound base. There is small (1-33%) pale granulation within the wound bed. There is a large  (67- 100%) amount of necrotic tissue within the wound bed including Adherent Slough. Wound #35 status is Open. Original cause of wound was Gradually Appeared. The wound is located on the Left Ischium. The wound measures 5cm length x 2.7cm width x 0.2cm depth; 10.603cm^2 area and 2.121cm^3 volume. There is Fat Layer (Subcutaneous Tissue) Exposed exposed. There is no tunneling or undermining noted. There is a medium amount of serosanguineous drainage noted. The wound margin is flat and intact. There is large (67-100%) red, friable granulation within the wound bed. There is a small (1-33%) amount of necrotic tissue within the wound bed including Adherent Slough. 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 There was a Excisional Skin/Subcutaneous Tissue Debridement with a total area of 7.13 sq cm performed by Ricard Dillon., MD. With the following instrument(s): Curette to remove Viable and Non-Viable tissue/material. Material removed includes Subcutaneous Tissue and Slough and. No specimens were taken. A time out was conducted at 08:28, prior to the start of the procedure. A Minimum amount of bleeding was controlled with N/A. The procedure was tolerated well with a pain level of 0 throughout and a pain level of 0 following the procedure. Post Debridement Measurements: 2.3cm length x 3.1cm width x 0.1cm depth; 0.56cm^3 volume. Character of Wound/Ulcer Post Debridement is improved. Post procedure Diagnosis Wound #  24: Same as Pre-Procedure Plan Follow-up Appointments: Return Appointment in 1 week. Dressing Change Frequency: Wound #17 Right,Dorsal Toe - Web between  1st and 2nd: Change Dressing every other day. Wound #24 Left,Dorsal Foot: Change Dressing every other day. Wound #35 Left Ischium: Change Dressing every other day. Skin Barriers/Peri-Wound Care: Antifungal cream - on toes on both feet daily Moisturizing lotion - both legs Other: - Triamcinolone cream Wound #35 Left Ischium: Barrier cream Wound Cleansing: Wound #17 Right,Dorsal Toe - Web between 1st and 2nd: Clean wound with Wound Cleanser Primary Wound Dressing: Wound #17 Right,Dorsal Toe - Web between 1st and 2nd: Iodoflex Wound #24 Left,Dorsal Foot: Iodoflex Wound #35 Left Ischium: Hydrofera Blue Secondary Dressing: Wound #17 Right,Dorsal Toe - Web between 1st and 2nd: Kerlix/Rolled Gauze Dry Gauze Wound #24 Left,Dorsal Foot: Kerlix/Rolled Gauze Dry Gauze Heel Cup - add heel cup to left heel for protection. Wound #35 Left Ischium: Foam Border Edema Control: Elevate legs to the level of the heart or above for 30 minutes daily and/or when sitting, a frequency of: - throughout the day Support Garment 30-40 mm/Hg pressure to: - Juxtalite to both legs. Off-Loading: Low air-loss mattress (Group 2) Roho cushion for wheelchair Turn and reposition every 2 hours - out of wheelchair throughout the day, try to lay on sides, sleep in the bed not the recliner 1. I change the primary dressing on both feet to Iodoflex. Silver alginate does not seem to be working especially on the left foot 2. Ran Puraply through his insurance this is again for his feet. 3. Left buttock pressure ulcer. Macerated skin around this which is erythematous and flaking I have asked him to use Lotrisone in this area. I have not changed the Hydrofera Blue. Electronic Signature(s) Signed: 09/24/2019 5:57:33 PM By: Linton Ham MD Entered By: Linton Ham on 09/24/2019 08:45:05 -------------------------------------------------------------------------------- SuperBill Details Patient Name: Date of  Service: Feltner, [name] 09/24/2019 Medical Record [id number] Patient Account Number: [id number] Date of Birth/Sex: Treating RN: 1986-01-22 (33 y.o. [name] Primary Care Provider: [place], [place] Other Clinician: Referring Provider: Treating Provider/Extender:Robson, [name], [name]: 194 Diagnosis Coding ICD-10 Codes Code Description L97.511 Non-pressure chronic ulcer of other part of right 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 L97.521 Non-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, complete 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, stage 3 Facility Procedures CPT4 Code Description: [id number] - DEB SUBQ TISSUE 20 SQ CM/< ICD-10 Diagnosis Description L97.521 Non-pressure chronic ulcer of other part of left foot limited Modifier: to breakdown o Quantity: 1 f skin Physician Procedures CPT4 Code Description: [id number] - WC PHYS SUBQ TISS 20 SQ CM ICD-10 Diagnosis Description L97.521 Non-pressure chronic ulcer of other part of left foot lim Modifier: ited to breakdo Quantity: 1 wn of skin Electronic Signature(s) Signed: 09/24/2019 8:46:02 AM By: Linton Ham MD Entered By: Linton Ham on 09/24/2019 08:46:01

## 2019-10-01 ENCOUNTER — Encounter (HOSPITAL_BASED_OUTPATIENT_CLINIC_OR_DEPARTMENT_OTHER): Payer: BC Managed Care – PPO | Attending: Internal Medicine | Admitting: Internal Medicine

## 2019-10-01 ENCOUNTER — Other Ambulatory Visit: Payer: Self-pay

## 2019-10-01 DIAGNOSIS — L89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, stage 3: Secondary | ICD-10-CM | POA: Insufficient documentation

## 2019-10-01 DIAGNOSIS — L97522 Non-pressure chronic ulcer of other part of left foot with fat layer exposed: Secondary | ICD-10-CM | POA: Insufficient documentation

## 2019-10-01 DIAGNOSIS — L97512 Non-pressure chronic ulcer of other part of right foot with fat layer exposed: Secondary | ICD-10-CM | POA: Insufficient documentation

## 2019-10-01 DIAGNOSIS — G8221 Paraplegia, complete: Secondary | ICD-10-CM | POA: Insufficient documentation

## 2019-10-01 NOTE — Progress Notes (Signed)
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 wound circumference. This is a bit of an odd situation in which the epithelialization seems to be coming from the surface of the wound rather than in the exact circumference. There is still small open areas mostly along the lateral margin of the wound. He has unchanged areas between the left first and second and the right first second toes which I been treating for tenia pedis 06/02/17; left lateral leg venous insufficiency wound. We have been using Hydrofera Blue. Somewhat smaller from the wound circumference. The surface of the wound remains a bit on it almost epithelialized sedation in appearance. I use an open curette today debridement in the surface of all of this especially the edges Small open wounds remaining on the dorsal right first and second toe interspace and the plantar left first second toe and her face on the left 06/09/17; wound on the left lateral leg continues to be smaller but very gradual and very dry surface using Hydrofera Blue 06/16/17 requires weekly debridements now on the left lateral leg although this continues to  contract. I changed to silver collagen last week because of dryness of the wound bed. Using Iodoflex to the areas on his first and second toes/web space bilaterally 06/24/17; patient with history of paraplegia also chronic venous insufficiency with  lymphedema. Has a very difficult wound on the left lateral leg. This has been gradually reducing in terms of with but comes in with a very dry adherent surface. High switch to silver collagen a week or so ago with hydrogel to keep the area moist. This is been refractory to multiple dressing attempts. He also has areas in his first and second toes bilaterally in the anterior and posterior web space. I had been using Iodoflex here after a prolonged course of silver alginate with ketoconazole was ineffective [question tinea pedis] 07/14/17; patient arrives today with a very difficult adherent material over his left lateral lower leg wound. He also has surrounding erythema and poorly controlled edema. He was switched his Santyl last visit which the nurses are applying once during his doctor visit and once on a nurse visit. He was also reduced to 2 layer compression I'm not exactly sure of the issue here. 07/21/17; better surface today after 1 week of Iodoflex. Significant cellulitis that we treated last week also better. [Doxycycline] 07/28/17 better surface today with now 2 weeks of Iodoflex. Significant cellulitis treated with doxycycline. He has now completed the doxycycline and he is back to his usual degree of chronic venous inflammation/stasis dermatitis. He reminds me he has had ablations surgery here 08/04/17; continued improvement with Iodoflex to the left lateral leg wound in terms of the surface of the wound although the dimensions are better. 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 Problematic areas on the plantar aspect of the first second toe web space on the left and the  dorsal aspect of the first second toe web space on the right. At one point I felt these were probably related to chronic fungal infections in treated him aggressively for this although we have not made any improvement here. 08/11/17; left lateral leg. Surface continues to improve with the Iodoflex although we are not seeing much improvement in overall wound dimensions. Areas on his plantar left foot and right foot show no improvement. In fact the right foot looks somewhat worse 08/18/17; left lateral leg. We changed to [facility] Blue last week after a prolonged course of Iodoflex which helps get the surface better. It appears that the wound with is improved. Continue with difficult areas on the left dorsal first second and plantar first second on the right 09/01/17; patient arrives in clinic today having had a temperature of 103 yesterday. He was seen in the ER and [facility]. The patient was concerned he could have cellulitis again in the right leg however they diagnosed him with a UTI and he is now on Keflex. He has a history of cellulitis which is been recurrent and difficult but this is been in the left leg, in the past 5 use doxycycline. He does in and out catheterizations at home which are risk factors for UTI 09/08/17; patient will be completing his Keflex this weekend. The erythema on the left leg is considerably better. He has a new wound today on the medial part of the right leg small superficial almost looks like a skin tear. He has worsening of the area on the right dorsal first and second toe. His major area on the left lateral leg is better. Using Hydrofera Blue on all areas 09/15/17; gradual reduction in width on the long wound in the left lateral leg. No debridement required. He also has wounds on the plantar aspect of his left first second toe web space and on  the dorsal aspect of the right first second toe web space. 09/22/17; there continues to be very gradual improvements in the  dimensions of the left lateral leg wound. He hasn't round erythematous spot with might be pressure on his wheelchair. There is no evidence obviously of infection no purulence no warmth He has a dry scaled area on the plantar aspect of the left first second toe Improved area on the dorsal right first second toe. 09/29/17; left lateral leg wound continues to improve in dimensions mostly with an is still a fairly long but increasingly narrow wound. He has a dry scaled area on the plantar aspect of his left first second toe web space Increasingly concerning area on the dorsal right first second toe. In fact I am concerned today about possible cellulitis around this wound. The areas extending up his second toe and although there is deformities here almost appears to abut on the nailbed. 10/06/17; left lateral leg wound continues to make very gradual progress. Tissue culture I did from the right first second toe dorsal foot last time grew MRSA and enterococcus which was vancomycin sensitive. This was not sensitive to clindamycin or doxycycline. He is allergic to Zyvox and sulfa we have therefore arrange for him to have dalvance infusion tomorrow. He is had this in the past and tolerated it well 10/20/17; left lateral leg wound continues to make decent progress. This is certainly reduced in terms of with there is advancing epithelialization.The cellulitis in the right foot looks better although he still has a deep wound in the dorsal aspect of the first second toe web space. Plantar left first toe web space on the left I think is making some progress 10/27/17; left lateral leg wound continues to make decent progress. Advancing epithelialization.using Hydrofera Blue The right first second toe web space wound is better-looking using silver alginate Improvement in the left plantar first second toe web space. Again using silver alginate 11/03/17 left lateral leg wound continues to make decent progress  albeit slowly. Using [facility] The right per second toe web space continues to be a very problematic looking punched out wound. I obtained a piece of tissue for deep culture I did extensively treated this for fungus. It is difficult to imagine that this is a pressure area as the patient states other than going outside he doesn't really wear shoes at home The left plantar first second toe web space looked fairly senescent. Necrotic edges. This required debridement change to [facility] Blue to all wound areas 11/10/17; left lateral leg wound continues to contract. Using Hydrofera Blue On the right dorsal first second toe web space dorsally. Culture I did of this area last week grew MRSA there is not an easy oral option in this patient was multiple antibiotic allergies or intolerances. This was only a rare culture isolate I'm therefore going to use Bactroban under silver alginate On the left plantar first second toe web space. Debridement is required here. This is also unchanged 11/17/17; left lateral leg wound continues to contract using Hydrofera Blue this is no longer the major issue. The major concern here is the right first second toe web space. He now has an open area going from dorsally to the plantar aspect. There is now wound on the inner lateral part of the first toe. Not a very viable surface on this. There is erythema spreading medially into the forefoot. No major change in the left first second toe plantar wound 11/24/17; left lateral leg wound continues to  contract using [employer]. Nice improvement today The right first second toe web space all of this looks a lot less angry than last week. I have given him clindamycin and topical Bactroban for MRSA and terbinafine for the possibility of underlining tinea pedis that I could not control with ketoconazole. Looks somewhat better The area on the plantar left first second toe web space is weeping with dried debris around the  wound 12/01/17; left lateral leg wound continues to contract he Hydrofera Blue. It is becoming thinner in terms of with nevertheless it is making good improvement. The right first second toe web space looks less angry but still a large necrotic-looking wounds starting on the plantar aspect of the right foot extending between the toes and now extensively on the base of the right second toe. I gave him clindamycin and topical Bactroban for MRSA anterior benefiting for the possibility of underlying tinea pedis. Not looking better today The area on the left first/second toe looks better. Debrided of necrotic debris 12/05/17* the patient was worked in urgently today because over the weekend he found blood on his incontinence bad when he woke up. He was found to have an ulcer by his wife who does most of his wound care. He came in today for [place] to look at this. He has not had a history of wounds in his buttocks in spite of his paraplegia. 12/08/17; seen in follow-up today at his usual appointment. He was seen earlier this week and found to have a new wound on his buttock. We also follow him for wounds on the left lateral leg, left first second toe web space and right first second toe web space 12/15/17; we have been using Hydrofera Blue to the left lateral leg which has improved. The right first second toe web space has also improved. Left first second toe web space plantar aspect looks stable. The left buttock has worsened using Santyl. Apparently the buttock has drainage 12/22/17; we have been using Hydrofera Blue to the left lateral leg which continues to improve now 2 small wounds separated by normal skin. He tells [place] he had a fever up to 100 yesterday he is prone to UTIs but has not noted anything different. He does in and out catheterizations. The area between the first and second toes today does not look good necrotic surface covered with what looks to be purulent drainage and erythema extending into  the third toe. I had gotten this to something that I thought look better last time however it is not look good today. He also has a necrotic surface over the buttock wound which is expanded. I thought there might be infection under here so I removed a lot of the surface with a #5 curet though nothing look like it really needed culturing. He is been using Santyl to this area 12/27/17; his original wound on the left lateral leg continues to improve using Hydrofera Blue. I gave him samples of Baxdella although he was unable to take them out of fear for an allergic reaction ["lump in his throat"].the culture I did of the purulent drainage from his second toe last week showed both enterococcus and a set Enterobacter I was also concerned about the erythema on the bottom of his foot although paradoxically although this looks somewhat better today. 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 We've been using silver alginate here. They came up today  that he sleeps in a recliner, I'm not sure why but I asked him to stop this 01/03/18; his original wound we've been using Hydrofera Blue is now separated into 2 areas. Ulcer on his left buttock is better he is off the recliner and sleeping in bed Finally both wound areas between his first and second toes also looks some better 01/10/18; his original wound on the left lateral leg is now separated into 2 wounds we've been using Hydrofera Blue Ulcer on his left buttock has some drainage. There is a small probing site going into muscle layer superiorly.using silver alginate -He arrives today with a deep tissue injury on the left heel The wound on the dorsal aspect of his first second toe on the left looks a lot betterusing silver alginate ketoconazole The area on the first second toe web space on the right also looks a lot bette 01/17/18; his original wound on the left  lateral leg continues to progress using Hydrofera Blue Ulcer on his left buttock also is smaller surface healthier except for a small probing site going into the muscle layer superiorly. 2.4 cm of tunneling in this area DTI on his left heel we have only been offloading. Looks better than last week no threatened open no evidence of infection the wound on the dorsal aspect of the first second toe on the left continues to look like it's regressing we have only been using silver alginate and terbinafine orally The area in the first second toe web space on the right also looks to be a lot better using silver alginate and terbinafine I think this was prompted by tinea pedis 01/31/18; the patient was hospitalized in [place] last week apparently for a complicated UTI. He was discharged on cefepime he does in and out catheterizations. In the hospital he was discovered M I don't mild elevation of AST and ALTs and the terbinafine was stopped.predictably the pressure ulcer on his buttock looks betterusing silver alginate. The area on the left lateral leg also is better using Hydrofera Blue. The area between the first and second toes on the left better. First and second toes on the right still substantial but better. Finally the DTI on the left heel has held together and looks like it's resolving 02/07/18-he is here in follow-up evaluation for multiple ulcerations. He has new injury to the lateral aspect of the last issue a pressure ulcer, he states this is from adhesive removal trauma. He states he has tried multiple adhesive products with no success. All other ulcers appear stable. The left heel DTI is resolving. We will continue with same treatment plan and follow-up next week. 02/14/18; follow-up for multiple areas. He has a new area last week on the lateral aspect of his pressure ulcer more over the posterior trochanter. The original pressure ulcer looks quite stable has healthy granulation. 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 Almost fully epithelialized on the original superior area using Hydrofera Blue DTI on the left heel has peeled off this week to reveal a small superficial wound under denuded skin and subcutaneous tissue Both areas between the first and second toes look better including nothing open on the left 02/21/18; The patient's wounds on his left ischial tuberosity and posterior left greater trochanter actually looked better. He has a large area of irritation around the area which I think is contact dermatitis. I am doubtful that this is  fungal His original wound on the left lateral calf continues to improve we have been using Hydrofera Blue There is no open area in the left first second toe web space although there is a lot of thick callus The DTI on the left heel required debridement today of necrotic surface eschar and subcutaneous tissue using silver alginate Finally the area on the right first second toe webspace continues to contract using silver alginate and ketoconazole 02/28/18 Left ischial tuberosity wounds look better using silver alginate. Original wound on the left calf only has one small open area left using Hydrofera Blue DTI on the left heel required debridement mostly removing skin from around this wound surface. Using silver alginate The areas on the right first/second toe web space using silver alginate and ketoconazole 03/08/18 on evaluation today patient appears to be doing decently well as best I can tell in regard to his wounds. This is the first time that I have seen him as he generally is followed by Dr. Dellia Nims. With that being said none of his wounds appear to be infected he does have an area where there is some skin covering what appears to be a new wound on the left dorsal surface of his great toe. This is right at the nail bed. With that being  said I do believe that debrided away some of the excess skin can be of benefit in this regard. Otherwise he has been tolerating the dressing changes without complication. 03/14/18; patient arrives today with the multiplicity of wounds that we are following. He has not been systemically unwell Original wound on the left lateral calf now only has 2 small open areas we've been using Hydrofera Blue which should continue The deep tissue injury on the left heel requires debridement today. We've been using silver alginate The left first second toe and the right first second toe are both are reminiscence what I think was tinea pedis. Apparently some of the callus Surface between the toes was removed last week when it started draining. Purulent drainage coming from the wound on the ischial tuberosity on the left. 03/21/18-He is here in follow-up evaluation for multiple wounds. 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 He tolerated debridement. The only change to last week's recommendations is to discontinue antifungal cream between toes. He will follow-up next week 03/28/18; following up for multiple wounds;Concern this week is streaking redness and swelling in the right foot. He is going to need antibiotics for this. 03/31/18; follow-up for right foot cellulitis. Streaking redness and swelling in the right foot on 03/28/18. 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 I put him on clindamycin 300 mg every 6 and brought him in for a quick check. He has an open wound between his first and second toes on the right foot as a potential source. 04/04/18; Right foot cellulitis is resolving he is completing clindamycin. This is truly good news Left lateral calf wound which is initial wound only has one small open area inferiorly this is close to healing out. He has compression stockings. We will use Hydrofera Blue right  down to the epithelialization of this Nonviable surface on the left heel which was initially pressure with a DTI. We've been using Hydrofera Blue. I'm going to switch this back to silver alginate Left first second toe/tinea pedis this looks better using silver alginate Right first second toe tinea pedis using silver alginate Large pressure ulcers on theLeft ischial  tuberosity. Small wound here Looks better. I am uncertain about the surface over the large wound. Using silver alginate 04/11/18; Cellulitis in the right foot is resolved Left lateral calf wound which was his original wounds still has 2 tiny open areas remaining this is just about closed Nonviable surface on the left heel is better but still requires debridement Left first second toe/tinea pedis still open using silver alginate Right first second toe wound tinea pedis I asked him to go back to using ketoconazole and silver alginate Large pressure ulcers on the left ischial tuberosity this shear injury here is resolved. Wound is smaller. No evidence of infection using silver alginate 04/18/18; Patient arrives with an intense area of cellulitis in the right mid lower calf extending into the right heel area. Bright red and warm. Smaller area on the left anterior leg. He has a significant history of MRSA. He will definitely need antibioticsdoxycycline He now has 2 open areas on the left ischial tuberosity the original large wound and now a satellite area which I think was above his initial satellite areas. Not a wonderful surface on this satellite area surrounding erythema which looks like pressure related. His left lateral calf wound again his original wound is just about closed Left heel pressure injury still requiring debridement Left first second toe looks a lot better using silver alginate Right first second toe also using silver alginate and ketoconazole cream also looks better 04/20/18; the patient was worked in early today out  of concerns with his cellulitis on the right leg. I had started him on doxycycline. This was 2 days ago. His wife was concerned about the swelling in the area. Also concerned about the left buttock. He has not been systemically unwell no fever chills. No nausea vomiting or diarrhea 04/25/18; the patient's left buttock wound is continued to deteriorate he is using Hydrofera Blue. He is still completing clindamycin for the cellulitis on the right leg although all of this looks better. 05/02/18 Left buttock wound still with a lot of drainage and a very tightly adherent fibrinous necrotic surface. He has a deeper area superiorly The left lateral calf wound is still closed DTI wound on the left heel necrotic surface especially the circumference using Iodoflex Areas between his left first second toe and right first second toe both look better. Dorsally and the right first second toe he had a necrotic surface although at smaller. In using silver alginate and ketoconazole. I did a culture last week which was a deep tissue culture of the reminiscence of the open wound on the right first second toe dorsally. This grew a few Acinetobacter and a few methicillin-resistant staph aureus. Nevertheless the area actually this week looked better. I didn't feel the need to specifically address this at least in terms of systemic antibiotics. 05/09/18; wounds are measuring larger more drainage per our intake. We are using Santyl covered with alginate on the large superficial buttock wounds, Iodosorb on the left heel, ketoconazole and silver alginate to the dorsal first and second toes bilaterally. 05/16/18; The area on his left buttock better in some aspects although the area superiorly over the ischial tuberosity required an extensive debridement.using Santyl Left heel appears stable. Using Iodoflex The areas between his first and second toes are not bad however there is spreading erythema up the dorsal aspect of his  left foot this looks like cellulitis again. He is insensate the erythema is really very brilliant.o Erysipelas He went to see an allergist days ago because  he was itching part of this he had lab work done. This showed a white count of 15.1 with 70% neutrophils. Hemoglobin of 11.4 and a platelet count of 659,000. Last white count we had in Epic was a 2-1/2 years ago which was 25.9 but he was ill at the time. He was able to show me some lab work that was done by his primary physician the pattern is about the same. 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 But prompted me to go ahead and do x-rays of both feet and the pelvis rule out osteomyelitis. He also had a comprehensive metabolic panel this was reasonably normal his albumin was 3.7 liver function tests BUN/creatinine all normal 05/23/18; x-rays of both his feet from last week were negative for underlying pulmonary abnormality. The x-ray of his pelvis however showed mild irregularity in the left ischial which may represent some early osteomyelitis. The wound in the left ischial continues to get deeper clearly now exposed muscle. Each week necrotic surface material over this area. Whereas the rest of the wounds do not look so bad. The left ischial wound we have been using Santyl and calcium alginate To the left heel surface necrotic debris using Iodoflex The left lateral leg is still healed Areas on the left dorsal foot and the right dorsal foot are about the same. There is some inflammation on the left which might represent contact dermatitis, fungal dermatitis I am doubtful cellulitis although this looks better than last week 05/30/18; CT scan done at Hospital did not show any osteomyelitis or abscess. Suggested the possibility of underlying cellulitis although I don't see a lot of evidence of this at the bedside The wound itself on the left buttock/upper thigh actually looks somewhat better. No  debridement Left heel also looks better no debridement continue Iodoflex Both dorsal first second toe spaces appear better using Lotrisone. Left still required debridement 06/06/18; Intake reported some purulent looking drainage from the left gluteal wound. Using Santyl and calcium alginate Left heel looks better although still a nonviable surface requiring debridement The left dorsal foot first/second webspace actually expanding and somewhat deeper. I may consider doing a shave biopsy of this area Right dorsal foot first/second webspace appears stable to improved. Using Lotrisone and silver alginate to both these areas 06/13/18 Left gluteal surface looks better. Now separated in the 2 wounds. No debridement required. Still drainage. We'll continue silver alginate Left heel continues to look better with Iodoflex continue this for at least another week Of his dorsal foot wounds the area on the left still has some depth although it looks better than last week. We've been using Lotrisone and silver alginate 06/20/18 Left gluteal continues to look better healthy tissue Left heel continues to look better healthy granulation wound is smaller. He is using Iodoflex and his long as this continues continue the Iodoflex Dorsal right foot looks better unfortunately dorsal left foot does not. There is swelling and erythema of his forefoot. He had minor trauma to this several days ago but doesn't think this was enough to have caused any tissue injury. Foot looks like cellulitis, we have had this problem before 06/27/18 on evaluation today patient appears to be doing a little worse in regard to his foot ulcer. 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 Both of which  would really be his only options for treating this infection. In the past he has been given and effusion  of Orbactiv. This is done very well for him in the past again it's one time dosing IV antibiotic therapy. Subsequently I do believe this is something we're gonna need to see about doing at this point in time. Currently his other wounds seem to be doing somewhat better in my pinion I'm pretty happy in that regard. 07/03/18 on evaluation today patient's wounds actually appear to be doing fairly well. He has been tolerating the dressing changes without complication. All in all he seems to be showing signs of improvement. In regard to the antibiotics he has been dealing with infectious disease since I saw him last week as far as getting this scheduled. In the end he's going [facility] this coming Friday. In the meantime he has been continuing to perform the dressing changes in such as previous. There does not appear to be any evidence of infection worsengin at this time. 07/10/18; Since I last saw this man 2 weeks ago things have actually improved. IV antibiotics of resulted in less forefoot erythema although there is still some present. He is not systemically unwell Left buttock wounds 2 now have no depth there is increased epithelialization Using silver alginate Left heel still requires debridement using Iodoflex Left dorsal foot still with a sizable wound about the size of a border but healthy granulation Right dorsal foot still with a slitlike area using silver alginate 07/18/18; the patient's cellulitis in the left foot is improved in fact I think it is on its way to resolving. Left buttock wounds 2 both look better although the larger one has hypertension granulation we've been using silver alginate Left heel has some thick circumferential redundant skin over the wound edge which will need to be removed today we've been using Iodoflex Left dorsal foot is still a sizable wound required debridement using silver alginate The right dorsal foot is  just about closed only a small open area remains here 07/25/18; left foot cellulitis is resolved Left buttock wounds 2 both look better. Hyper-granulation on the major area Left heel as some debris over the surface but otherwise looks a healthier wound. Using silver collagen Right dorsal foot is just about closed 07/31/18; arrives with our intake nurse worried about purulent drainage from the buttock. We had hyper-granulation here last week His buttock wounds 2 continue to look better Left heel some debris over the surface but measuring smaller. Right dorsal foot unfortunately has openings between the toes Left foot superficial wound looks less aggravated. 08/07/18 Buttock wounds continue to look better although some of her granulation and the larger medial wound. silver alginate Left heel continues to look a lot better.silver collagen Left foot superficial wound looks less stable. Requires debridement. He has a new wound superficial area on the foot on the lateral dorsal foot. Right foot looks better using silver alginate without Lotrisone 08/14/2018; patient was in the ER last week diagnosed with a UTI. He is now on Cefpodoxime and Macrodantin. Buttock wounds continued to be smaller. Using silver alginate Left heel continues to look better using silver collagen Left foot superficial wound looks as though it is improving Right dorsal foot area is just about healed. 08/21/2018; patient is completed his antibiotics for his UTI. He has 2 open areas on the buttocks. There is still not closed although the surface looks satisfactory. Using  silver alginate Left heel continues to improve using silver collagen The bilateral dorsal foot areas which are at the base of his first and second toes/possible tinea pedis are actually stable on the left but worse on the right. The area on the left required debridement of necrotic surface. After debridement I obtained a specimen for PCR culture. The right dorsal  foot which is been just about healed last week is now reopened 08/28/2018; culture done on the left dorsal foot showed coag negative staph both staph epidermidis and Lugdunensis. I think this is worthwhile initiating systemic treatment. I will use doxycycline given his long list of allergies. The area on the left heel slightly improved but still requiring debridement. The large wound on the buttock is just about closed whereas the smaller one is larger. Using silver alginate in this area 09/04/2018; patient is completing his doxycycline for the left foot although this continues to be a very difficult wound area with very adherent necrotic debris. We are using silver alginate to all his wounds right foot left foot and the small wounds on his buttock, silver collagen on the left heel. 09/11/2018; once again this patient has intense erythema and swelling of the left forefoot. Lesser degrees of erythema in the right foot. He has a long list of allergies and intolerances. I will reinstitute doxycycline. 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 Using silver alginate Left heel also looks better using silver collagen Unfortunately both the areas on his feet look worse. The area on the left first second webspace is now gone through to the plantar part of his foot. The area on the left foot anteriorly is irritated with erythema and swelling in the forefoot. 09/25/2018 His wound on the left plantar heel looks better. Using silver collagen The area on the left buttock 2 small remnant areas. One is closed one is still open. Using silver alginate The areas between both his first and second toes look worse. This in spite of long-standing antifungal therapy with ketoconazole and silver alginate which should have antifungal activity He has small areas around his original wound on the left calf one is on the bottom of the original scar tissue and one superiorly  both of these are small and superficial but again given wound history in this site this is worrisome 10/02/2018 Left plantar heel continues to gradually contract using silver collagen Left buttock wound is unchanged using silver alginate The areas on his dorsal feet between his first and second toes bilaterally look about the same. I prescribed clindamycin ointment to see if we can address chronic staph colonization and also the underlying possibility of erythrasma The left lateral lower extremity wound is actually on the lateral part of his ankle. Small open area here. We have been using silver alginate 10/09/2018; Left plantar heel continues to look healthy and contract. No debridement is required Left buttock slightly smaller with a tape injury wound just below which was new this week Dorsal feet somewhat improved I have been using clindamycin Left lateral looks lower extremity the actual open area looks worse although a lot of this is epithelialized. I am going to change to silver collagen today He has a lot more swelling in the right leg although this is not pitting not red and not particularly warm there is a lot of spasm in the right leg usually indicative of people with paralysis of some underlying discomfort. We have reviewed his vascular  status from 2017 he had a left greater saphenous vein ablation. I wonder about referring him back to vascular surgery if the area on the left leg continues to deteriorate. 10/16/2018 in today for follow-up and management of multiple lower extremity ulcers. His left Buttock wound is much lower smaller and almost closed completely. The wound to the left ankle has began to reopen with Epithelialization and some adherent slough. He has multiple new areas to the left foot and leg. The left dorsal foot without much improvement. Wound present between left great webspace and 2nd toe. Erythema and edema present right leg. Right LE ultrasound obtained on  10/10/18 was negative for DVT. 10/23/2018; Left buttock is closed over. Still dry macerated skin but there is no open wound. I suspect this is chronic pressure/moisture Left lateral calf is quite a bit worse than when I saw this last. There is clearly drainage here he has macerated skin into the left plantar heel. We will change the primary dressing to alginate Left dorsal foot has some improvement in overall wound area. Still using clindamycin and silver alginate Right dorsal foot about the same as the left using clindamycin and silver alginate The erythema in the right leg has resolved. He is DVT rule out was negative Left heel pressure area required debridement although the wound is smaller and the surface is health 10/26/2018 The patient came back in for his nurse check today predominantly because of the drainage coming out of the left lateral leg with a recent reopening of his original wound on the left lateral calf. He comes in today with a large amount of surrounding erythema around the wound extending from the calf into the ankle and even in the area on the dorsal foot. He is not systemically unwell. He is not febrile. Nevertheless this looks like cellulitis. We have been using silver alginate to the area. I changed him to a regular visit and I am going to prescribe him doxycycline. The rationale here is a long list of medication intolerances and a history of MRSA. I did not see anything that I thought would provide a valuable culture 10/30/2018 Follow-up from his appointment 4 days ago with really an extensive area of cellulitis in the left calf left lateral ankle and left dorsal foot. I put him on doxycycline. He has a long list of medication allergies which are true allergy reactions. 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 In any case he is a lot better today. The erythema especially in the anterior and lateral  left calf is better. He still has left ankle erythema. He also is complaining about increasing edema in the right leg we have only been using Kerlix Coban and he has been doing the wraps at home. Finally he has a spotty rash on the medial part of his upper left calf which looks like folliculitis or perhaps wrap occlusion type injury. Small superficial macules not pustules 11/06/18 patient arrives today with again a considerable degree of erythema around the wound on the left lateral calf extending into the dorsal ankle and dorsal foot. This is a lot worse than when I saw this last week. He is on doxycycline really with not a lot of improvement. He has not been systemically unwell Wounds on the; left heel actually looks improved. Original area on the left foot and proximity to the first and second toes looks about the same. He has superficial areas on the  dorsal foot, anterior calf and then the reopening of his original wound on the left lateral calf which looks about the same The only area he has on the right is the dorsal webspace first and second which is smaller. He has a large area of dry erythematous skin on the left buttock small open area here. 11/13/2018; the patient arrives in much better condition. The erythema around the wound on the left lateral calf is a lot better. Not sure whether this was the clindamycin or the TCA and ketoconazole or just in the improvement in edema control [stasis dermatitis]. In any case this is a lot better. The area on the left heel is very small and just about resolved using silver collagen we have been using silver alginate to the areas on his dorsal feet 11/20/2018; his wounds include the left lateral calf, left heel, dorsal aspects of both feet just proximal to the first second webspace. He is stable to slightly improved. I did not think any changes to his dressings were going to be necessary 11/27/2018 he has a reopening on the left buttock which is  surrounded by what looks like tinea or perhaps some other form of dermatitis. The area on the left dorsal foot has some erythema around it I have marked this area but I am not sure whether this is cellulitis or not. Left heel is not closed. Left calf the reopening is really slightly longer and probably worse 1/13; in general things look better and smaller except for the left dorsal foot. Area on the left heel is just about closed, left buttock looks better only a small wound remains in the skin looks better [using Lotrisone] 1/20; the area on the left heel only has a few remaining open areas here. Left lateral calf about the same in terms of size, left dorsal foot slightly larger right lateral foot still not closed. The area on the left buttock has no open wound and the surrounding skin looks a lot better 1/27; the area on the left heel is closed. Left lateral calf better but still requiring extensive debridements. The area on his left buttock is closed. He still has the open areas on the left dorsal foot which is slightly smaller in the right foot which is slightly expanded. We have been using Iodoflex on these areas as well 2/3; left heel is closed. Left lateral calf still requiring debridement using Iodoflex there is no open area on his left buttock however he has dry scaly skin over a large area of this. Not really responding well to the Lotrisone. Finally the areas on his dorsal feet at the level of the first second webspace are slightly smaller on the right and about the same on the left. Both of these vigorously debrided with Anasept and gauze 2/10; left heel remains closed he has dry erythematous skin over the left buttock but there is no open wound here. Left lateral leg has come in and with. Still requiring debridement we have been using Iodoflex here. Finally the area on the left dorsal foot and right dorsal foot are really about the same extremely dry callused fissured areas. He  does not yet have a dermatology appointment 2/17; left heel remains closed. He has a new open area on the left buttock. The area on the left lateral calf is bigger longer and still covered in necrotic debris. No major change in his foot areas bilaterally. I am awaiting for a dermatologist to look on this. We have been  using ketoconazole I do not know that this is been doing any good at all. 2/24; left heel remains closed. The left buttock wound that was new reopening last week looks better. The left lateral calf appears better also although still requires debridement. The major area on his foot is the left first second also requiring debridement. We have been putting Prisma on all wounds. I do not believe that the ketoconazole has done too much good for his feet. He will use Lotrisone I am going to give him a 2-week course of terbinafine. We still do not have a dermatology appointment 3/2 left heel remains closed however there is skin over bone in this area I pointed this out to him today. The left buttock wound is epithelialized but still does not look completely stable. The area on the left leg required debridement were using silver collagen here. With regards to his feet we changed to Lotrisone last week and silver alginate. 3/9; left heel remains closed. Left buttock remains closed. The area on the right foot is essentially closed. The left foot remains unchanged. Slightly smaller on the left lateral calf. Using silver collagen to both of these areas 3/16-Left heel remains closed. Area on right foot is closed. Left lateral calf above the lateral malleolus open wound requiring debridement with easy bleeding. Left dorsal wound proximal to first toe also debrided. Left ischial area open new. Patient has been using Prisma with wrapping every 3 days. Dermatology appointment is apparently tomorrow.Patient has completed his terbinafine 2-week course with some apparent improvement according to  him, there is still flaking and dry skin in his foot on the left 3/23; area on the right foot is reopened. The area on the left anterior foot is about the same still a very necrotic adherent surface. He still has the area on the left leg and reopening is on the left buttock. He apparently saw dermatology although I do not have a note. According to the patient who is usually fairly well informed they did not have any good ideas. Put him on oral terbinafine which she is been on before. 3/30; using silver collagen to all wounds. Apparently his dermatologist put him on doxycycline and rifampin presumably some culture grew staph. I do not have this result. He remains on terbinafine although I have used terbinafine on him before 4/6; patient has had a fairly substantial reopening on the right foot between the first and second toes. He is finished his terbinafine and I believe is on doxycycline and rifampin still as prescribed by dermatology. We have been using silver collagen to all his wounds although the patient reports that he thinks silver alginate does better on the wounds on his buttock. 4/13; the area on his left lateral calf about the same size but it did not require debridement. Left dorsal foot just proximal to the webspace between the first and second toes is about the same. Still nonviable surface. I note some superficial bronze discoloration of the dorsal part of his foot Right dorsal foot just proximal to the first and second toes also looks about the same. I still think there may be the same discoloration I noted above on the left Left buttock wound looks about the same 4/20; left lateral calf appears to be gradually contracting using silver collagen. 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 The left dorsal foot wound is debrided of tightly adherent necrotic debris and really cleans up quite nicely. The right area  is worse with  expansion. I did not debride this it is now over the base of the second toe The area on his left buttock is smaller no debridement is required using silver collagen 5/4; left calf continues to make good progress. He arrives with erythema around the wounds on his dorsal foot which even extends to the plantar aspect. Very concerning for coexistent infection. He is finished the erythromycin I gave him for possible erythrasma this does not seem to have helped. The area on the left foot is about the same base of the dorsal toes Is area on the buttock looks improved on the left 5/11; left calf and left buttock continued to make good progress. Left foot is about the same to slightly improved. Major problem is on the right foot. He has not had an x-ray. Deep tissue culture I did last week showed both Enterobacter and E. coli. I did not change the doxycycline I put him on empirically although neither 1 of these were plated to doxycycline. He arrives today with the erythema looking worse on both the dorsal and plantar foot. Macerated skin on the bottom of the foot. he has not been systemically unwell 5/18-Patient returns at 1 week, left calf wound appears to be making some progress, left buttock wound appears slightly worse than last time, left foot wound looks slightly better, right foot redness is marginally better. X-ray of both feet show no air or evidence of osteomyelitis. Patient is finished his Omnicef and terbinafine. He continues to have macerated skin on the bottom of the left foot as well as right 5/26; left calf wound is better, left buttock wound appears to have multiple small superficial open areas with surrounding macerated skin. X-rays that I did last time showed no evidence of osteomyelitis in either foot. He is finished cefdinir and doxycycline. I do not think that he was on terbinafine. He continues to have a large superficial open area on the right foot anterior dorsal and slightly  between the first and second toes. I did send him to dermatology 2 months ago or so wondering about whether they would do a fungal scraping. I do not believe they did but did do a culture. We have been using silver alginate to the toe areas, he has been using antifungals at home topically either ketoconazole or Lotrisone. We are using silver collagen on the left foot, silver alginate on the right, silver collagen on the left lateral leg and silver alginate on the left buttock 6/1; left buttock area is healed. We have the left dorsal foot, left lateral leg and right dorsal foot. We are using silver alginate to the areas on both feet and silver collagen to the area on his left lateral calf 6/8; the left buttock apparently reopened late last week. He is not really sure how this happened. He is tolerating the terbinafine. Using silver alginate to all wounds 6/15; left buttock wound is larger than last week but still superficial. Came in the clinic today with a report of purulence from the left lateral leg I did not identify any infection Both areas on his dorsal feet appear to be better. He is tolerating the terbinafine. Using silver alginate to all wounds 6/22; left buttock is about the same this week, left calf quite a bit better. His left foot is about the same however he comes in with erythema and warmth in the right forefoot once again. Culture that I gave him in the beginning of May showed Enterobacter  and E. coli. I gave him doxycycline and things seem to improve although neither 1 of these organisms was specifically plated. 6/29; left buttock is larger and dry this week. Left lateral calf looks to me to be improved. Left dorsal foot also somewhat improved right foot completely unchanged. The erythema on the right foot is still present. He is completing the Ceftin dinner that I gave him empirically [see discussion above.) 7/6 - All wounds look to be stable and perhaps improved, the left  buttock wound is slightly smaller, per patient bleeds easily, completed ceftin, the right foot redness is less, he is on terbinafine 7/13; left buttock wound about the same perhaps slightly narrower. Area on the left lateral leg continues to narrow. Left dorsal foot slightly smaller right foot about the same. We are using silver alginate on the right foot and Hydrofera Blue to the areas on the left. Unna boot on the left 2 layer compression on the right 7/20; left buttock wound absolutely the same. Area on lateral leg continues to get better. Left dorsal foot require debridement as did the right no major change in the 7/27; left buttock wound the same size necrotic debris over the surface.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 the patient's right leg is much better although there is still some edema here this could be reminiscent from his severe cellulitis likely on top of some degree of lymphedema. His left anterior leg wound has less surface slough as reported by her  intake nurse. Small wound at the base of the left second toe 12/02/16; patient's right leg  is better and there is no open wound here. His left anterior lateral leg wound continues to have a healthy-looking surface. Small wound at the base of the left second toe however there is erythema in the left forefoot which is worrisome 12/16/16; is no open wounds on his right leg. We took measurements for stockings. His left anterior lateral leg wound continues to have a healthy-looking surface. I'm not sure where we were with the Apligraf run through his insurance. We have been using Iodoflex. He has a thick eschar on the left first second toe interface, I suspect this may be fungal however there is no visible open 12/23/16; no open wound on his right leg. He has 2 small areas left of the linear wound that was remaining last week. We have been using Prisma, I thought I have disclosed this week, we can only look forward to next week 01/03/17; the patient had concerning areas of erythema last week, already on doxycycline for UTI through his primary doctor. The erythema is absolutely no better there is warmth and swelling both medially from the left lateral leg wound and also the dorsal left foot. 01/06/17- Patient is here for follow-up evaluation of his left lateral leg ulcer and bilateral feet ulcers. He is on oral antibiotic therapy, tolerating that. Nursing staff and the patient states that the erythema is improved from Monday. 01/13/17; the predominant left lateral leg wound continues to be problematic. I had put Apligraf on him earlier this month once. However he subsequently developed what appeared to be an intense cellulitis around the left lateral leg wound. I gave him Dalvance I think on 2/12 perhaps 2/13 he continues on cefdinir. The erythema is still present but the warmth and swelling is improved. I am hopeful that the cellulitis part of this control. I wouldn't be surprised if there is an element of venous inflammation as well. 01/17/17. The erythema is present but better in the left leg.  His left lateral leg wound still does not have a viable surface buttons certain parts of this long thin wound it appears like there has been improvement in dimensions. 01/20/17; the erythema still present but much better in the left leg. I'm thinking this is his usual degree of chronic venous inflammation. The wound on the left leg looks somewhat better. Is less surface slough 01/27/17; erythema is back to the chronic venous inflammation. The wound on the left leg is somewhat better. I am back to the point where I like to try an Apligraf once again 02/10/17; slight improvement in wound dimensions. Apligraf #2. He is completing his doxycycline 02/14/17; patient arrives today having completed doxycycline last Thursday. This was supposed to be a nurse visit however once again he hasn't tense erythema from the medial part of his wound extending over the lower leg. Also erythema in his foot this is roughly in the same distribution as last time. He has baseline chronic venous inflammation however this is a lot worse than the baseline I have learned to accept the on him is baseline inflammation 02/24/17- patient is here for follow-up evaluation. He is tolerating compression therapy. His voicing no complaints or concerns he is here anticipating an Apligraf 03/03/17; he arrives today with an adherent necrotic surface. I don't think this is surface is going to be amenable for Apligraf's. The erythema around his wound and on the left dorsal foot has resolved he  is off antibiotics 03/10/17; better-looking surface today. I don't think he can tolerate Apligraf's. He tells me he had a wound VAC after a skin graft years ago to this area and they had difficulty with a seal. The erythema continues to be stable around this some degree of chronic venous inflammation but he also has recurrent cellulitis. We have been using Iodoflex 03/17/17; continued improvement in the surface and may be small changes in dimensions. Using  Iodoflex which seems the only thing that will control his surface 03/24/17- He is here for follow up evaluation of his LLE lateral ulceration and ulcer to right dorsal foot/toe space. He is voicing no complaints or concerns, He is tolerating compression wrap. 03/31/17 arrives today with a much healthier looking wound on the left lower extremity. We have been using Iodoflex for a prolonged period of time which has for the first time prepared and adequate looking wound bed although we have not had much in the way of wound dimension improvement. He also has a small wound between the first and second toe on the right 04/07/17; arrives today with a healthy-looking wound bed and at least the top 50% of this wound appears to be now her. No debridement was required I have changed him to [facility] last week after prolonged Iodoflex. He did not do well with Apligraf's. We've had a re-opening between the first and second toe on the right 04/14/17; arrives today with a healthier looking wound bed contractions and the top 50% of this wound and some on the lesser 50%. Wound bed appears healthy. The area between the first and second toe on the right still remains problematic 04/21/17; continued very gradual improvement. Using [facility] 04/28/17; continued very gradual improvement in the left lateral leg venous insufficiency wound. His periwound erythema is very mild. We have been using Hydrofera Blue. Wound is making progress especially in the superior 50% 05/05/17; he continues to have very gradual improvement in the left lateral venous insufficiency wound. Both in terms with an length rings are improving. I debrided this every 2 weeks with #5 curet and we have been using Hydrofera Blue and again making good progress With regards to the wounds between his right first and second toe which I thought might of been tinea pedis he is not making as much progress very dry scaly skin over the area. Also the area at  the base of the left first and second toe in a similar condition 05/12/17; continued gradual improvement in the refractory left lateral venous insufficiency wound on the left. Dimension smaller. Surface still requiring debridement using Hydrofera Blue 05/19/17; continued gradual improvement in the refractory left lateral venous ulceration. Careful inspection of the wound bed underlying rumination suggested some degree of epithelialization over the surface no debridement indicated. Continue Hydrofera Blue difficult areas between his toes first and third on the left than first and second on the right. I'm going to change to silver alginate from silver collagen. Continue ketoconazole as I suspect underlying tinea pedis 05/26/17; left lateral leg venous insufficiency wound. We've been using Hydrofera Blue. I believe that there is expanding epithelialization over the surface of the wound albeit not coming from the wound circumference. This is a bit of an odd situation in which the epithelialization seems to be coming from the surface of the wound rather than in the exact circumference. There is still small open areas mostly along the lateral margin of the wound. ooHe has unchanged areas between the left first and  second and the right first second toes which I been treating for tenia pedis 06/02/17; left lateral leg venous insufficiency wound. We have been using Hydrofera Blue. Somewhat smaller from the wound circumference. The surface of the wound remains a bit on it almost epithelialized sedation in appearance. I use an open curette today debridement in the surface of all of this especially the edges ooSmall open wounds remaining on the dorsal right first and second toe interspace and the plantar left first second toe and her face on the left 06/09/17; wound on the left lateral leg continues to be smaller but very gradual and very dry surface using Hydrofera Blue 06/16/17 requires weekly debridements  now on the left lateral leg although this continues to contract. I changed to silver collagen last week because of dryness of the wound bed. Using Iodoflex to the areas on his first and second toes/web space bilaterally 06/24/17; patient with history of paraplegia also chronic venous insufficiency with lymphedema. Has a very difficult wound on the left lateral leg. This has been gradually reducing in terms of with but comes in with a very dry adherent surface. High switch to silver collagen a week or so ago with hydrogel to keep the area moist. This is been refractory to multiple dressing attempts. He also has areas in his first and second toes bilaterally in the anterior and posterior web space. I had been using Iodoflex here after a prolonged course of silver alginate with ketoconazole was ineffective [question tinea pedis] 07/14/17; patient arrives today with a very difficult adherent material over his left lateral lower leg wound. He also has surrounding erythema and poorly controlled edema. He was switched his Santyl last visit which the nurses are applying once during his doctor visit and once on a nurse visit. He was also reduced to 2 layer compression I'm not exactly sure of the issue here. 07/21/17; better surface today after 1 week of Iodoflex. Significant cellulitis that we treated last week also better. [Doxycycline] 07/28/17 better surface today with now 2 weeks of Iodoflex. Significant cellulitis treated with doxycycline. He has now completed the doxycycline and he is back to his usual degree of chronic venous inflammation/stasis dermatitis. He reminds me he has had ablations surgery here 08/04/17; continued improvement with Iodoflex to the left lateral leg wound in terms of the surface of the wound although the dimensions are better. 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 Problematic areas on the plantar aspect of  the first second toe web space on the left and the dorsal aspect of the first second toe web space on the right. At one point I felt these were probably related to chronic fungal infections in treated him aggressively for this although we have not made any improvement here. 08/11/17; left lateral leg. Surface continues to improve with the Iodoflex although we are not seeing much improvement in overall wound dimensions. Areas on his plantar left foot and right foot show no improvement. In fact the right foot looks somewhat worse 08/18/17; left lateral leg. We changed to [facility] Blue last week after a prolonged course of Iodoflex which helps get the surface better. It appears that the wound with is improved. Continue with difficult areas on the left dorsal first second and plantar first second on the right 09/01/17; patient arrives in clinic today having had a temperature of 103 yesterday. He was seen in the ER and [facility]. The patient was concerned he  could have cellulitis again in the right leg however they diagnosed him with a UTI and he is now on Keflex. He has a history of cellulitis which is been recurrent and difficult but this is been in the left leg, in the past 5 use doxycycline. He does in and out catheterizations at home which are risk factors for UTI 09/08/17; patient will be completing his Keflex this weekend. The erythema on the left leg is considerably better. He has a new wound today on the medial part of the right leg small superficial almost looks like a skin tear. He has worsening of the area on the right dorsal first and second toe. His major area on the left lateral leg is better. Using Hydrofera Blue on all areas 09/15/17; gradual reduction in width on the long wound in the left lateral leg. No debridement required. He also has wounds on the plantar aspect of his left first second toe web space and on the dorsal aspect of the right first second toe web space. 09/22/17;  there continues to be very gradual improvements in the dimensions of the left lateral leg wound. He hasn't round erythematous spot with might be pressure on his wheelchair. There is no evidence obviously of infection no purulence no warmth ooHe has a dry scaled area on the plantar aspect of the left first second toe ooImproved area on the dorsal right first second toe. 09/29/17; left lateral leg wound continues to improve in dimensions mostly with an is still a fairly long but increasingly narrow wound. ooHe has a dry scaled area on the plantar aspect of his left first second toe web space ooIncreasingly concerning area on the dorsal right first second toe. In fact I am concerned today about possible cellulitis around this wound. The areas extending up his second toe and although there is deformities here almost appears to abut on the nailbed. 10/06/17; left lateral leg wound continues to make very gradual progress. Tissue culture I did from the right first second toe dorsal foot last time grew MRSA and enterococcus which was vancomycin sensitive. This was not sensitive to clindamycin or doxycycline. He is allergic to Zyvox and sulfa we have therefore arrange for him to have dalvance infusion tomorrow. He is had this in the past and tolerated it well 10/20/17; left lateral leg wound continues to make decent progress. This is certainly reduced in terms of with there is advancing epithelialization.ooThe cellulitis in the right foot looks better although he still has a deep wound in the dorsal aspect of the first second toe web space. Plantar left first toe web space on the left I think is making some progress 10/27/17; left lateral leg wound continues to make decent progress. Advancing epithelialization.using Hydrofera Blue ooThe right first second toe web space wound is better-looking using silver alginate ooImprovement in the left plantar first second toe web space. Again using silver  alginate 11/03/17 left lateral leg wound continues to make decent progress albeit slowly. Using Hydrofera Blue ooThe right per second toe web space continues to be a very problematic looking punched out wound. I obtained a piece of tissue for deep culture I did extensively treated this for fungus. It is difficult to imagine that this is a pressure area as the patient states other than going outside he doesn't really wear shoes at home ooThe left plantar first second toe web space looked fairly senescent. Necrotic edges. This required debridement oochange to Hydrofera Blue to all wound areas 11/10/17; left lateral  leg wound continues to contract. Using Hydrofera Blue ooOn the right dorsal first second toe web space dorsally. Culture I did of this area last week grew MRSA there is not an easy oral option in this patient was multiple antibiotic allergies or intolerances. This was only a rare culture isolate I'm therefore going to use Bactroban under silver alginate ooOn the left plantar first second toe web space. Debridement is required here. This is also unchanged 11/17/17; left lateral leg wound continues to contract using Hydrofera Blue this is no longer the major issue. ooThe major concern here is the right first second toe web space. He now has an open area going from dorsally to the plantar aspect. There is now wound on the inner lateral part of the first toe. Not a very viable surface on this. There is erythema spreading medially into the forefoot. ooNo major change in the left first second toe plantar wound 11/24/17; left lateral leg wound continues to contract using Hydrofera Blue. Nice improvement today ooThe right first second toe web space all of this looks a lot less angry than last week. I have given him clindamycin and topical Bactroban for MRSA and terbinafine for the possibility of underlining tinea pedis that I could not control with ketoconazole. Looks somewhat  better ooThe area on the plantar left first second toe web space is weeping with dried debris around the wound 12/01/17; left lateral leg wound continues to contract he Hydrofera Blue. It is becoming thinner in terms of with nevertheless it is making good improvement. ooThe right first second toe web space looks less angry but still a large necrotic-looking wounds starting on the plantar aspect of the right foot extending between the toes and now extensively on the base of the right second toe. I gave him clindamycin and topical Bactroban for MRSA anterior benefiting for the possibility of underlying tinea pedis. Not looking better today ooThe area on the left first/second toe looks better. Debrided of necrotic debris 12/05/17* the patient was worked in urgently today because over the weekend he found blood on his incontinence bad when he woke up. He was found to have an ulcer by his wife who does most of his wound care. He came in today for [place] to look at this. He has not had a history of wounds in his buttocks in spite of his paraplegia. 12/08/17; seen in follow-up today at his usual appointment. He was seen earlier this week and found to have a new wound on his buttock. We also follow him for wounds on the left lateral leg, left first second toe web space and right first second toe web space 12/15/17; we have been using Hydrofera Blue to the left lateral leg which has improved. The right first second toe web space has also improved. Left first second toe web space plantar aspect looks stable. The left buttock has worsened using Santyl. Apparently the buttock has drainage 12/22/17; we have been using Hydrofera Blue to the left lateral leg which continues to improve now 2 small wounds separated by normal skin. He tells [place] he had a fever up to 100 yesterday he is prone to UTIs but has not noted anything different. He does in and out catheterizations. The area between the first and second toes today  does not look good necrotic surface covered with what looks to be purulent drainage and erythema extending into the third toe. I had gotten this to something that I thought look better last time however it  is not look good today. He also has a necrotic surface over the buttock wound which is expanded. I thought there might be infection under here so I removed a lot of the surface with a #5 curet though nothing look like it really needed culturing. He is been using Santyl to this area 12/27/17; his original wound on the left lateral leg continues to improve using Hydrofera Blue. I gave him samples of Baxdella although he was unable to take them out of fear for an allergic reaction ["lump in his throat"].the culture I did of the purulent drainage from his second toe last week showed both enterococcus and a set Enterobacter I was also concerned about the erythema on the bottom of his foot although paradoxically although this looks somewhat better today. 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 We've been using silver alginate here. They came up today that he sleeps in a recliner, I'm not sure why but I asked him to stop this 01/03/18; his original wound we've been using Hydrofera Blue is now separated into 2 areas. ooUlcer on his left buttock is better he is off the recliner and sleeping in bed ooFinally both wound areas between his first and second toes also looks some better 01/10/18; his original wound on the left lateral leg is now separated into 2 wounds we've been using Hydrofera Blue ooUlcer on his left buttock has some drainage. There is a small probing site going into muscle layer superiorly.using silver alginate -He arrives today with a deep tissue injury on the left heel ooThe wound on the dorsal aspect of his first second toe on the left looks a lot betterusing silver  alginate ketoconazole ooThe area on the first second toe web space on the right also looks a lot bette 01/17/18; his original wound on the left lateral leg continues to progress using Hydrofera Blue ooUlcer on his left buttock also is smaller surface healthier except for a small probing site going into the muscle layer superiorly. 2.4 cm of tunneling in this area ooDTI on his left heel we have only been offloading. Looks better than last week no threatened open no evidence of infection oothe wound on the dorsal aspect of the first second toe on the left continues to look like it's regressing we have only been using silver alginate and terbinafine orally ooThe area in the first second toe web space on the right also looks to be a lot better using silver alginate and terbinafine I think this was prompted by tinea pedis 01/31/18; the patient was hospitalized in [place] last week apparently for a complicated UTI. He was discharged on cefepime he does in and out catheterizations. In the hospital he was discovered M I don't mild elevation of AST and ALTs and the terbinafine was stopped.predictably the pressure ulcer on his buttock looks betterusing silver alginate. The area on the left lateral leg also is better using Hydrofera Blue. The area between the first and second toes on the left better. First and second toes on the right still substantial but better. Finally the DTI on the left heel has held together and looks like it's resolving 02/07/18-he is here in follow-up evaluation for multiple ulcerations. He has new injury to the lateral aspect of the last issue a pressure ulcer, he states this is from adhesive removal trauma. He states he has tried multiple adhesive products with no success. All other ulcers  appear stable. The left heel DTI is resolving. We will continue with same treatment plan and follow-up next week. 02/14/18; follow-up for multiple areas. ooHe has a new area last week on  the lateral aspect of his pressure ulcer more over the posterior trochanter. The original pressure ulcer looks quite stable has healthy granulation. 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 Almost fully epithelialized on the original superior area using Hydrofera Blue ooDTI on the left heel has peeled off this week to reveal a small superficial wound under denuded skin and subcutaneous tissue ooBoth areas between the first and second toes look better including nothing open on the left 02/21/18; ooThe patient's wounds on his left ischial tuberosity and posterior left greater trochanter actually looked better. He has a large area of irritation around the area which I think is contact dermatitis. I am doubtful that this is fungal ooHis original wound on the left lateral calf continues to improve we have been using Hydrofera Blue ooThere is no open area in the left first second toe web space although there is a lot of thick callus ooThe DTI on the left heel required debridement today of necrotic surface eschar and subcutaneous tissue using silver alginate ooFinally the area on the right first second toe webspace continues to contract using silver alginate and ketoconazole 02/28/18 ooLeft ischial tuberosity wounds look better using silver alginate. ooOriginal wound on the left calf only has one small open area left using Hydrofera Blue ooDTI on the left heel required debridement mostly removing skin from around this wound surface. Using silver alginate ooThe areas on the right first/second toe web space using silver alginate and ketoconazole 03/08/18 on evaluation today patient appears to be doing decently well as best I can tell in regard to his wounds. This is the first time that I have seen him as he generally is followed by Dr. Dellia Nims. With that being said none of his wounds  appear to be infected he does have an area where there is some skin covering what appears to be a new wound on the left dorsal surface of his great toe. This is right at the nail bed. With that being said I do believe that debrided away some of the excess skin can be of benefit in this regard. Otherwise he has been tolerating the dressing changes without complication. 03/14/18; patient arrives today with the multiplicity of wounds that we are following. He has not been systemically unwell ooOriginal wound on the left lateral calf now only has 2 small open areas we've been using Hydrofera Blue which should continue ooThe deep tissue injury on the left heel requires debridement today. We've been using silver alginate ooThe left first second toe and the right first second toe are both are reminiscence what I think was tinea pedis. Apparently some of the callus Surface between the toes was removed last week when it started draining. ooPurulent drainage coming from the wound on the ischial tuberosity on the left. 03/21/18-He is here in follow-up evaluation for multiple wounds. 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 He tolerated debridement. The only change to last week's recommendations is to discontinue antifungal cream between toes. He will follow-up next week 03/28/18; following up for multiple wounds;Concern this week is streaking redness and swelling in the right foot. He is going to need antibiotics for this. 03/31/18; follow-up  for right foot cellulitis. Streaking redness and swelling in the right foot on 03/28/18. 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 I put him on clindamycin 300 mg every 6 and brought him in for a quick check. He has an open wound between his first and second toes on the right foot as a potential source. 04/04/18; ooRight foot cellulitis is resolving he is completing  clindamycin. This is truly good news ooLeft lateral calf wound which is initial wound only has one small open area inferiorly this is close to healing out. He has compression stockings. We will use Hydrofera Blue right down to the epithelialization of this ooNonviable surface on the left heel which was initially pressure with a DTI. We've been using Hydrofera Blue. I'm going to switch this back to silver alginate ooLeft first second toe/tinea pedis this looks better using silver alginate ooRight first second toe tinea pedis using silver alginate ooLarge pressure ulcers on theLeft ischial tuberosity. Small wound here Looks better. I am uncertain about the surface over the large wound. Using silver alginate 04/11/18; ooCellulitis in the right foot is resolved ooLeft lateral calf wound which was his original wounds still has 2 tiny open areas remaining this is just about closed ooNonviable surface on the left heel is better but still requires debridement ooLeft first second toe/tinea pedis still open using silver alginate ooRight first second toe wound tinea pedis I asked him to go back to using ketoconazole and silver alginate ooLarge pressure ulcers on the left ischial tuberosity this shear injury here is resolved. Wound is smaller. No evidence of infection using silver alginate 04/18/18; ooPatient arrives with an intense area of cellulitis in the right mid lower calf extending into the right heel area. Bright red and warm. Smaller area on the left anterior leg. He has a significant history of MRSA. He will definitely need antibioticsoodoxycycline ooHe now has 2 open areas on the left ischial tuberosity the original large wound and now a satellite area which I think was above his initial satellite areas. Not a wonderful surface on this satellite area surrounding erythema which looks like pressure related. ooHis left lateral calf wound again his original wound is just about  closed ooLeft heel pressure injury still requiring debridement ooLeft first second toe looks a lot better using silver alginate ooRight first second toe also using silver alginate and ketoconazole cream also looks better 04/20/18; the patient was worked in early today out of concerns with his cellulitis on the right leg. I had started him on doxycycline. This was 2 days ago. His wife was concerned about the swelling in the area. Also concerned about the left buttock. He has not been systemically unwell no fever chills. No nausea vomiting or diarrhea 04/25/18; the patient's left buttock wound is continued to deteriorate he is using Hydrofera Blue. He is still completing clindamycin for the cellulitis on the right leg although all of this looks better. 05/02/18 ooLeft buttock wound still with a lot of drainage and a very tightly adherent fibrinous necrotic surface. He has a deeper area superiorly ooThe left lateral calf wound is still closed ooDTI wound on the left heel necrotic surface especially the circumference using Iodoflex ooAreas between his left first second toe and right first second toe both look better. Dorsally and the right first second toe he had a necrotic surface although at smaller. In using silver alginate and ketoconazole. I did a culture last week which was a deep tissue culture of the  reminiscence of the open wound on the right first second toe dorsally. This grew a few Acinetobacter and a few methicillin-resistant staph aureus. Nevertheless the area actually this week looked better. I didn't feel the need to specifically address this at least in terms of systemic antibiotics. 05/09/18; wounds are measuring larger more drainage per our intake. We are using Santyl covered with alginate on the large superficial buttock wounds, Iodosorb on the left heel, ketoconazole and silver alginate to the dorsal first and second toes bilaterally. 05/16/18; ooThe area on his left  buttock better in some aspects although the area superiorly over the ischial tuberosity required an extensive debridement.using Santyl ooLeft heel appears stable. Using Iodoflex ooThe areas between his first and second toes are not bad however there is spreading erythema up the dorsal aspect of his left foot this looks like cellulitis again. He is insensate the erythema is really very brilliant.o Erysipelas He went to see an allergist days ago because he was itching part of this he had lab work done. This showed a white count of 15.1 with 70% neutrophils. Hemoglobin of 11.4 and a platelet count of 659,000. Last white count we had in Epic was a 2-1/2 years ago which was 25.9 but he was ill at the time. He was able to show me some lab work that was done by his primary physician the pattern is about the same. 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 But prompted me to go ahead and do x-rays of both feet and the pelvis rule out osteomyelitis. He also had a comprehensive metabolic panel this was reasonably normal his albumin was 3.7 liver function tests BUN/creatinine all normal 05/23/18; x-rays of both his feet from last week were negative for underlying pulmonary abnormality. The x-ray of his pelvis however showed mild irregularity in the left ischial which may represent some early osteomyelitis. The wound in the left ischial continues to get deeper clearly now exposed muscle. Each week necrotic surface material over this area. Whereas the rest of the wounds do not look so bad. ooThe left ischial wound we have been using Santyl and calcium alginate ooTo the left heel surface necrotic debris using Iodoflex ooThe left lateral leg is still healed ooAreas on the left dorsal foot and the right dorsal foot are about the same. There is some inflammation on the left which might represent contact dermatitis, fungal dermatitis I am doubtful cellulitis  although this looks better than last week 05/30/18; CT scan done at Hospital did not show any osteomyelitis or abscess. Suggested the possibility of underlying cellulitis although I don't see a lot of evidence of this at the bedside ooThe wound itself on the left buttock/upper thigh actually looks somewhat better. No debridement ooLeft heel also looks better no debridement continue Iodoflex ooBoth dorsal first second toe spaces appear better using Lotrisone. Left still required debridement 06/06/18; ooIntake reported some purulent looking drainage from the left gluteal wound. Using Santyl and calcium alginate ooLeft heel looks better although still a nonviable surface requiring debridement ooThe left dorsal foot first/second webspace actually expanding and somewhat deeper. I may consider doing a shave biopsy of this area ooRight dorsal foot first/second webspace appears stable to improved. Using Lotrisone and silver alginate to both these areas 06/13/18 ooLeft gluteal surface looks better. Now separated in the 2 wounds. No debridement required. Still drainage. We'll continue silver alginate ooLeft heel continues to look better with Iodoflex continue this for at  least another week ooOf his dorsal foot wounds the area on the left still has some depth although it looks better than last week. We've been using Lotrisone and silver alginate 06/20/18 ooLeft gluteal continues to look better healthy tissue ooLeft heel continues to look better healthy granulation wound is smaller. He is using Iodoflex and his long as this continues continue the Iodoflex ooDorsal right foot looks better unfortunately dorsal left foot does not. There is swelling and erythema of his forefoot. He had minor trauma to this several days ago but doesn't think this was enough to have caused any tissue injury. Foot looks like cellulitis, we have had this problem before 06/27/18 on evaluation today patient appears to be  doing a little worse in regard to his foot ulcer. 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 Both of which would really be his only options for treating this infection. In the past he has been given and effusion of Orbactiv. This is done very well for him in the past again it's one time dosing IV antibiotic therapy. Subsequently I do believe this is something we're gonna need to see about doing at this point in time. Currently his other wounds seem to be doing somewhat better in my pinion I'm pretty happy in that regard. 07/03/18 on evaluation today patient's wounds actually appear to be doing fairly well. He has been tolerating the dressing changes without complication. All in all he seems to be showing signs of improvement. In regard to the antibiotics he has been dealing with infectious disease since I saw him last week as far as getting this scheduled. In the end [facility]'s [facility] this coming Friday. In the meantime he has been continuing to perform the dressing changes in such as previous. There does not appear to be any evidence of infection worsengin at this time. 07/10/18; ooSince I last saw this man 2 weeks ago things have actually improved. IV antibiotics of resulted in less forefoot erythema although there is still some present. He is not systemically unwell ooLeft buttock wounds o2 now have no depth there is increased epithelialization Using silver alginate ooLeft heel still requires debridement using Iodoflex ooLeft dorsal foot still with a sizable wound about the size of a border but healthy granulation ooRight dorsal foot still with a slitlike area using silver alginate 07/18/18; the patient's cellulitis in the left foot is improved in fact I think it is on its way to  resolving. ooLeft buttock wounds o2 both look better although the larger one has hypertension granulation we've been using silver alginate ooLeft heel has some thick circumferential redundant skin over the wound edge which will need to be removed today we've been using Iodoflex ooLeft dorsal foot is still a sizable wound required debridement using silver alginate ooThe right dorsal foot is just about closed only a small open area remains here 07/25/18; left foot cellulitis is resolved ooLeft buttock wounds o2 both look better. Hyper-granulation on the major area ooLeft heel as some debris over the surface but otherwise looks a healthier wound. Using silver collagen ooRight dorsal foot is just about closed 07/31/18; arrives with our intake nurse worried about purulent drainage from the buttock. We had hyper-granulation here last week ooHis buttock wounds o2 continue to look better ooLeft heel some debris  over the surface but measuring smaller. ooRight dorsal foot unfortunately has openings between the toes ooLeft foot superficial wound looks less aggravated. 08/07/18 ooButtock wounds continue to look better although some of her granulation and the larger medial wound. silver alginate ooLeft heel continues to look a lot better.silver collagen ooLeft foot superficial wound looks less stable. Requires debridement. He has a new wound superficial area on the foot on the lateral dorsal foot. ooRight foot looks better using silver alginate without Lotrisone 08/14/2018; patient was in the ER last week diagnosed with a UTI. He is now on Cefpodoxime and Macrodantin. ooButtock wounds continued to be smaller. Using silver alginate ooLeft heel continues to look better using silver collagen ooLeft foot superficial wound looks as though it is improving ooRight dorsal foot area is just about healed. 08/21/2018; patient is completed his antibiotics for his UTI. ooHe has 2 open areas on  the buttocks. There is still not closed although the surface looks satisfactory. Using silver alginate ooLeft heel continues to improve using silver collagen ooThe bilateral dorsal foot areas which are at the base of his first and second toes/possible tinea pedis are actually stable on the left but worse on the right. The area on the left required debridement of necrotic surface. After debridement I obtained a specimen for PCR culture. ooThe right dorsal foot which is been just about healed last week is now reopened 08/28/2018; culture done on the left dorsal foot showed coag negative staph both staph epidermidis and Lugdunensis. I think this is worthwhile initiating systemic treatment. I will use doxycycline given his long list of allergies. The area on the left heel slightly improved but still requiring debridement. ooThe large wound on the buttock is just about closed whereas the smaller one is larger. Using silver alginate in this area 09/04/2018; patient is completing his doxycycline for the left foot although this continues to be a very difficult wound area with very adherent necrotic debris. We are using silver alginate to all his wounds right foot left foot and the small wounds on his buttock, silver collagen on the left heel. 09/11/2018; once again this patient has intense erythema and swelling of the left forefoot. Lesser degrees of erythema in the right foot. He has a long list of allergies and intolerances. I will reinstitute doxycycline. 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 Using silver alginate ooLeft heel also looks better using silver collagen ooUnfortunately both the areas on his feet look worse. The area on the left first second webspace is now gone through to the plantar part of his foot. The area on the left foot anteriorly is irritated with erythema and swelling in the forefoot. 09/25/2018 ooHis wound on the  left plantar heel looks better. Using silver collagen ooThe area on the left buttock 2 small remnant areas. One is closed one is still open. Using silver alginate ooThe areas between both his first and second toes look worse. This in spite of long-standing antifungal therapy with ketoconazole and silver alginate which should have antifungal activity ooHe has small areas around his original wound on the left calf one is on the bottom of the original scar tissue and one superiorly both of these are small and superficial but again given wound history in this site this is worrisome 10/02/2018 ooLeft plantar heel continues to gradually contract using silver collagen ooLeft buttock wound is unchanged using silver alginate ooThe areas on his dorsal feet between his  first and second toes bilaterally look about the same. 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 Small open area here. We have been using silver alginate 10/09/2018; ooLeft plantar heel continues to look healthy and contract. No debridement is required ooLeft buttock slightly smaller with a tape injury wound just below which was new this week ooDorsal feet somewhat improved I have been using clindamycin ooLeft lateral looks lower extremity the actual open area looks worse although a lot of this is epithelialized. I am going to change to silver collagen today He has a lot more swelling in the right leg although this is not pitting not red and not particularly warm there is a lot of spasm in the right leg usually indicative of people with paralysis of some underlying discomfort. We have reviewed his vascular status from 2017 he had a left greater saphenous vein ablation. I wonder about referring him back to vascular surgery if the area on the left leg  continues to deteriorate. 10/16/2018 in today for follow-up and management of multiple lower extremity ulcers. His left Buttock wound is much lower smaller and almost closed completely. The wound to the left ankle has began to reopen with Epithelialization and some adherent slough. He has multiple new areas to the left foot and leg. The left dorsal foot without much improvement. Wound present between left great webspace and 2nd toe. Erythema and edema present right leg. Right LE ultrasound obtained on 10/10/18 was negative for DVT. 10/23/2018; ooLeft buttock is closed over. Still dry macerated skin but there is no open wound. I suspect this is chronic pressure/moisture ooLeft lateral calf is quite a bit worse than when I saw this last. There is clearly drainage here he has macerated skin into the left plantar heel. We will change the primary dressing to alginate ooLeft dorsal foot has some improvement in overall wound area. Still using clindamycin and silver alginate ooRight dorsal foot about the same as the left using clindamycin and silver alginate ooThe erythema in the right leg has resolved. He is DVT rule out was negative ooLeft heel pressure area required debridement although the wound is smaller and the surface is health 10/26/2018 ooThe patient came back in for his nurse check today predominantly because of the drainage coming out of the left lateral leg with a recent reopening of his original wound on the left lateral calf. He comes in today with a large amount of surrounding erythema around the wound extending from the calf into the ankle and even in the area on the dorsal foot. He is not systemically unwell. He is not febrile. Nevertheless this looks like cellulitis. We have been using silver alginate to the area. I changed him to a regular visit and I am going to prescribe him doxycycline. The rationale here is a long list of medication intolerances and a history of MRSA. I did  not see anything that I thought would provide a valuable culture 10/30/2018 ooFollow-up from his appointment 4 days ago with really an extensive area of cellulitis in the left calf left lateral ankle and left dorsal foot. I put him on doxycycline. He has a long list of medication allergies which are true allergy reactions. 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 In any case he is a  lot better today. The erythema especially in the anterior and lateral left calf is better. He still has left ankle erythema. He also is complaining about increasing edema in the right leg we have only been using Kerlix Coban and he has been doing the wraps at home. Finally he has a spotty rash on the medial part of his upper left calf which looks like folliculitis or perhaps wrap occlusion type injury. Small superficial macules not pustules 11/06/18 patient arrives today with again a considerable degree of erythema around the wound on the left lateral calf extending into the dorsal ankle and dorsal foot. This is a lot worse than when I saw this last week. He is on doxycycline really with not a lot of improvement. He has not been systemically unwell Wounds on the; left heel actually looks improved. Original area on the left foot and proximity to the first and second toes looks about the same. He has superficial areas on the dorsal foot, anterior calf and then the reopening of his original wound on the left lateral calf which looks about the same ooThe only area he has on the right is the dorsal webspace first and second which is smaller. ooHe has a large area of dry erythematous skin on the left buttock small open area here. 11/13/2018; the patient arrives in much better condition. The erythema around the wound on the left lateral calf is a lot better. Not sure whether this was the clindamycin or the TCA and ketoconazole or just in the improvement  in edema control [stasis dermatitis]. In any case this is a lot better. The area on the left heel is very small and just about resolved using silver collagen we have been using silver alginate to the areas on his dorsal feet 11/20/2018; his wounds include the left lateral calf, left heel, dorsal aspects of both feet just proximal to the first second webspace. He is stable to slightly improved. I did not think any changes to his dressings were going to be necessary 11/27/2018 he has a reopening on the left buttock which is surrounded by what looks like tinea or perhaps some other form of dermatitis. The area on the left dorsal foot has some erythema around it I have marked this area but I am not sure whether this is cellulitis or not. Left heel is not closed. Left calf the reopening is really slightly longer and probably worse 1/13; in general things look better and smaller except for the left dorsal foot. Area on the left heel is just about closed, left buttock looks better only a small wound remains in the skin looks better [using Lotrisone] 1/20; the area on the left heel only has a few remaining open areas here. Left lateral calf about the same in terms of size, left dorsal foot slightly larger right lateral foot still not closed. The area on the left buttock has no open wound and the surrounding skin looks a lot better 1/27; the area on the left heel is closed. Left lateral calf better but still requiring extensive debridements. The area on his left buttock is closed. He still has the open areas on the left dorsal foot which is slightly smaller in the right foot which is slightly expanded. We have been using Iodoflex on these areas as well 2/3; left heel is closed. Left lateral calf still requiring debridement using Iodoflex there is no open area on his left buttock however he has dry scaly skin over a large area of  this. Not really responding well to the Lotrisone. Finally the areas on his  dorsal feet at the level of the first second webspace are slightly smaller on the right and about the same on the left. Both of these vigorously debrided with Anasept and gauze 2/10; left heel remains closed he has dry erythematous skin over the left buttock but there is no open wound here. Left lateral leg has come in and with. Still requiring debridement we have been using Iodoflex here. Finally the area on the left dorsal foot and right dorsal foot are really about the same extremely dry callused fissured areas. He does not yet have a dermatology appointment 2/17; left heel remains closed. He has a new open area on the left buttock. The area on the left lateral calf is bigger longer and still covered in necrotic debris. No major change in his foot areas bilaterally. I am awaiting for a dermatologist to look on this. We have been using ketoconazole I do not know that this is been doing any good at all. 2/24; left heel remains closed. The left buttock wound that was new reopening last week looks better. The left lateral calf appears better also although still requires debridement. The major area on his foot is the left first second also requiring debridement. We have been putting Prisma on all wounds. I do not believe that the ketoconazole has done too much good for his feet. He will use Lotrisone I am going to give him a 2-week course of terbinafine. We still do not have a dermatology appointment 3/2 left heel remains closed however there is skin over bone in this area I pointed this out to him today. The left buttock wound is epithelialized but still does not look completely stable. The area on the left leg required debridement were using silver collagen here. With regards to his feet we changed to Lotrisone last week and silver alginate. 3/9; left heel remains closed. Left buttock remains closed. The area on the right foot is essentially closed. The left foot remains unchanged. Slightly  smaller on the left lateral calf. Using silver collagen to both of these areas 3/16-Left heel remains closed. Area on right foot is closed. Left lateral calf above the lateral malleolus open wound requiring debridement with easy bleeding. Left dorsal wound proximal to first toe also debrided. Left ischial area open new. Patient has been using Prisma with wrapping every 3 days. Dermatology appointment is apparently tomorrow.Patient has completed his terbinafine 2-week course with some apparent improvement according to him, there is still flaking and dry skin in his foot on the left 3/23; area on the right foot is reopened. The area on the left anterior foot is about the same still a very necrotic adherent surface. He still has the area on the left leg and reopening is on the left buttock. He apparently saw dermatology although I do not have a note. According to the patient who is usually fairly well informed they did not have any good ideas. Put him on oral terbinafine which she is been on before. 3/30; using silver collagen to all wounds. Apparently his dermatologist put him on doxycycline and rifampin presumably some culture grew staph. I do not have this result. He remains on terbinafine although I have used terbinafine on him before 4/6; patient has had a fairly substantial reopening on the right foot between the first and second toes. He is finished his terbinafine and I believe is on doxycycline and rifampin still as  prescribed by dermatology. We have been using silver collagen to all his wounds although the patient reports that he thinks silver alginate does better on the wounds on his buttock. 4/13; the area on his left lateral calf about the same size but it did not require debridement. ooLeft dorsal foot just proximal to the webspace between the first and second toes is about the same. Still nonviable surface. I note some superficial bronze discoloration of the dorsal part of his  foot ooRight dorsal foot just proximal to the first and second toes also looks about the same. I still think there may be the same discoloration I noted above on the left ooLeft buttock wound looks about the same 4/20; left lateral calf appears to be gradually contracting using silver collagen. 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 The left dorsal foot wound is debrided of tightly adherent necrotic debris and really cleans up quite nicely. The right area is worse with expansion. I did not debride this it is now over the base of the second toe ooThe area on his left buttock is smaller no debridement is required using silver collagen 5/4; left calf continues to make good progress. ooHe arrives with erythema around the wounds on his dorsal foot which even extends to the plantar aspect. Very concerning for coexistent infection. He is finished the erythromycin I gave him for possible erythrasma this does not seem to have helped. ooThe area on the left foot is about the same base of the dorsal toes ooIs area on the buttock looks improved on the left 5/11; left calf and left buttock continued to make good progress. Left foot is about the same to slightly improved. ooMajor problem is on the right foot. He has not had an x-ray. Deep tissue culture I did last week showed both Enterobacter and E. coli. I did not change the doxycycline I put him on empirically although neither 1 of these were plated to doxycycline. He arrives today with the erythema looking worse on both the dorsal and plantar foot. Macerated skin on the bottom of the foot. he has not been systemically unwell 5/18-Patient returns at 1 week, left calf wound appears to be making some progress, left buttock wound appears slightly worse than last time, left foot wound looks slightly better, right foot redness is marginally better. X-ray of both feet show no air or evidence  of osteomyelitis. Patient is finished his Omnicef and terbinafine. He continues to have macerated skin on the bottom of the left foot as well as right 5/26; left calf wound is better, left buttock wound appears to have multiple small superficial open areas with surrounding macerated skin. X-rays that I did last time showed no evidence of osteomyelitis in either foot. He is finished cefdinir and doxycycline. I do not think that he was on terbinafine. He continues to have a large superficial open area on the right foot anterior dorsal and slightly between the first and second toes. I did send him to dermatology 2 months ago or so wondering about whether they would do a fungal scraping. I do not believe they did but did do a culture. We have been using silver alginate to the toe areas, he has been using antifungals at home topically either ketoconazole or Lotrisone. We are using silver collagen on the left foot, silver alginate on the right, silver collagen on the left lateral leg and silver alginate on the left buttock 6/1; left buttock area is  healed. We have the left dorsal foot, left lateral leg and right dorsal foot. We are using silver alginate to the areas on both feet and silver collagen to the area on his left lateral calf 6/8; the left buttock apparently reopened late last week. He is not really sure how this happened. He is tolerating the terbinafine. Using silver alginate to all wounds 6/15; left buttock wound is larger than last week but still superficial. ooCame in the clinic today with a report of purulence from the left lateral leg I did not identify any infection ooBoth areas on his dorsal feet appear to be better. He is tolerating the terbinafine. Using silver alginate to all wounds 6/22; left buttock is about the same this week, left calf quite a bit better. His left foot is about the same however he comes in with erythema and warmth in the right forefoot once again. Culture that  I gave him in the beginning of May showed Enterobacter and E. coli. I gave him doxycycline and things seem to improve although neither 1 of these organisms was specifically plated. 6/29; left buttock is larger and dry this week. Left lateral calf looks to me to be improved. Left dorsal foot also somewhat improved right foot completely unchanged. The erythema on the right foot is still present. He is completing the Ceftin dinner that I gave him empirically [see discussion above.) 7/6 - All wounds look to be stable and perhaps improved, the left buttock wound is slightly smaller, per patient bleeds easily, completed ceftin, the right foot redness is less, he is on terbinafine 7/13; left buttock wound about the same perhaps slightly narrower. Area on the left lateral leg continues to narrow. Left dorsal foot slightly smaller right foot about the same. We are using silver alginate on the right foot and Hydrofera Blue to the areas on the left. Unna boot on the left 2 layer compression on the right 7/20; left buttock wound absolutely the same. Area on lateral leg continues to get better. Left dorsal foot require debridement as did the right no major change in the 7/27; left buttock wound the same size necrotic debris over the surface. The area on the lateral leg is closed once again. His left foot looks better right foot about the same although there is some involvement now of the posterior first second toe area. He is still on terbinafine which I have given him for a month, not certain a centimeter major change 06/25/19-All wounds appear to be slightly improved according to report, left buttock wound looks clean, both foot wounds have minimal to no debris the right dorsal foot has minimal slough. We are using Hydrofera Blue to the left and silver alginate to the right foot and ischial wound. 8/10-Wounds all appear to be around the same, the right forefoot distal part has some redness which was not  there before, however the wound looks clean and small. Ischial wound looks about the same with no changes 8/17; his wound on the left lateral calf which was his original chronic venous insufficiency wound remains closed. Since I last saw him the areas on the left dorsal foot right dorsal foot generally appear better but require debridement. The area on his left initial tuberosity appears somewhat larger to me perhaps hyper granulated and bleeds very easily. We have been using Hydrofera Blue to the left dorsal foot and silver alginate to everything else 8/24; left lateral calf remains closed. The areas on his dorsal feet on the webspace  of the first and second toes bilaterally both look better. The area on the left buttock which is the pressure ulcer stage II slightly smaller. I change the dressing to Hydrofera Blue to all areas 8/31; left lateral calf remains closed. The area on his dorsal feet bilaterally look better. Using Hydrofera Blue. Still requiring debridement on the left foot. No change in the left buttock pressure ulcers however 9/14; left lateral calf remains closed. Dorsal feet look quite a bit better than 2 weeks ago. Flaking dry skin also a lot better with the ammonium lactate I gave him 2 weeks ago. The area on the left buttock is improved. He states that his Roho cushion developed a leak and he is getting a new one, in the interim he is offloading this vigorously 9/21; left calf remains closed. Left heel which was a possible DTI looks better this week. He had macerated tissue around the left dorsal foot right foot looks satisfactory and improved left buttock wound. I changed his dressings to his feet to silver alginate bilaterally. Continuing Hydrofera Blue on the left buttock. 9/28 left calf remains closed. Left heel did not develop anything [possible DTI] dry flaking skin on the left dorsal foot. Right foot looks satisfactory. Improved left buttock wound. We are using silver  alginate on his feet Hydrofera Blue on the buttock. I have asked him to go back to the Lotrisone on his feet including the wounds and surrounding areas 10/5; left calf remains closed. The areas on the left and right feet about the same. A lot of this is epithelialized however debris over the remaining open areas. He is using Lotrisone and silver alginate. The area on the left buttock using Hydrofera Blue 10/26. Patient has been out for 3 weeks secondary to Covid concerns. He tested negative but I think his wife tested positive. He comes in today with the left foot substantially worse, right foot about the same. Even more concerning he states that the area on his left buttock closed over but then reopened and is considerably deeper in one aspect than it was before [stage III wound] 11/2; left foot really about the same as last week. Quarter sized wound on the dorsal foot just proximal to the first second toes. Surrounding erythema with areas of denuded epithelium. This is not really much different looking. Did not look like cellulitis this time however. ooRight foot area about the same.. We have been using silver alginate alginate on his toes ooLeft buttock still substantial irritated skin around the wound which I think looks somewhat better. We have been using Hydrofera Blue here. 11/9; left foot larger than last week and a very necrotic surface. Right foot I think is about the same perhaps slightly smaller. Debris around the circumference also addressed. Unfortunately on the left buttock there is been a decline. Satellite lesions below the major wound distally and now a an additional one posteriorly we have been using Hydrofera Blue but I think this is a pressure issue Objective Constitutional Sitting or standing Blood Pressure is within target range for patient.. Pulse regular and within target range for patient.Marland Kitchen Respirations regular, non-labored and within target range.. Temperature is  normal and within the target range for the patient.Marland Kitchen Appears in no distress. Vitals Time Taken: 7:53 AM, Height: 70 in, Weight: 216 lbs, BMI: 31, Temperature: 98 F, Pulse: 126 bpm, Respiratory Rate: 18 breaths/min, Blood Pressure: 125/65 mmHg. General Notes: Wound exam; left dorsal foot larger and with a necrotic surface. Debrided with a #  5 curette the degree of necrosis is not superficial. Deep debridement. Hemostasis with direct pressure ooRight dorsal foot about the same perhaps slightly smaller ooAn unfortunate additional what looks to be a shear injury on the left distally and posteriorly to the major wound. Integumentary (Hair, Skin) Wound #17 status is Open. Original cause of wound was Gradually Appeared. The wound is located on the Right,Dorsal Toe - Web between 1st and 2nd. The wound measures 4.5cm length x 6cm width x 0.1cm depth; 21.206cm^2 area and 2.121cm^3 volume. There is Fat Layer (Subcutaneous Tissue) Exposed exposed. There is no tunneling or undermining noted. There is a medium amount of serosanguineous drainage noted. The wound margin is distinct with the outline attached to the wound base. There is small (1-33%) pale granulation within the wound bed. There is a large (67-100%) amount of necrotic tissue within the wound bed including Adherent Slough. Wound #24 status is Open. Original cause of wound was Gradually Appeared. The wound is located on the Left,Dorsal Foot. The wound measures 3cm length x 3.5cm width x 0.2cm depth; 8.247cm^2 area and 1.649cm^3 volume. There is Fat Layer (Subcutaneous Tissue) Exposed exposed. There is no tunneling or undermining noted. There is a medium amount of serosanguineous drainage noted. The wound margin is distinct with the outline attached to the wound base. There is small (1-33%) pale granulation within the wound bed. There is a large (67- 100%) amount of necrotic tissue within the wound bed including Adherent Slough. Wound #35 status  is Open. Original cause of wound was Gradually Appeared. The wound is located on the Left Ischium. The wound measures 4.5cm length x 2cm width x 0.1cm depth; 7.069cm^2 area and 0.707cm^3 volume. There is Fat Layer (Subcutaneous Tissue) Exposed exposed. There is no tunneling or undermining noted. There is a medium amount of serosanguineous drainage noted. The wound margin is flat and intact. There is large (67-100%) red, friable granulation within the wound bed. There is a small (1-33%) amount of necrotic tissue within the wound bed including Adherent Slough. 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 There was a Excisional Skin/Subcutaneous Tissue Debridement with a total area of 1 sq cm performed by Ricard Dillon., MD. With the following instrument(s): Curette to remove Viable and Non-Viable tissue/material. Material removed includes Subcutaneous Tissue and Slough and. No specimens were taken. A time out was conducted at 08:16, prior to the start of the procedure. A Minimum amount of bleeding was controlled with Pressure. The procedure was tolerated well with a pain level of 0 throughout and a pain level of 0 following the procedure. Post Debridement Measurements: 4.5cm length x 6cm width x 0.1cm depth; 2.121cm^3 volume. Character of Wound/Ulcer Post Debridement is improved. Post procedure Diagnosis Wound #17: Same as Pre-Procedure Wound #24 Pre-procedure diagnosis of Wound #24 is an Inflammatory located on the Left,Dorsal Foot . There was a Excisional Skin/Subcutaneous Tissue Debridement with a total area of 10.5 sq cm performed by Ricard Dillon., MD. With the following instrument(s): Curette to remove Viable and Non-Viable tissue/material. Material removed includes Subcutaneous Tissue and Slough and. No specimens were taken. A time out was conducted at 08:16, prior to the start of  the procedure. A Minimum amount of bleeding was controlled with Pressure. The procedure was tolerated well with a pain level of 0 throughout and a pain level of 0 following the procedure. Post Debridement Measurements: 3cm length x 3.5cm width x 0.2cm depth; 1.649cm^3 volume. Character of Wound/Ulcer Post Debridement is improved. Post procedure Diagnosis Wound #24: Same as Pre-Procedure Plan Follow-up Appointments: Return Appointment in 1 week. Dressing Change Frequency: Wound #17 Right,Dorsal Toe - Web between 1st and 2nd: Change Dressing every other day. Wound #24 Left,Dorsal Foot: Change Dressing every other day. Wound #35 Left Ischium: Change Dressing every other day. Skin Barriers/Peri-Wound Care: Antifungal cream - on toes on both feet daily Moisturizing lotion - both legs Other: - Triamcinolone cream Wound #35 Left Ischium: Barrier cream Wound Cleansing: Wound #17 Right,Dorsal Toe - Web between 1st and 2nd: Clean wound with Wound Cleanser Primary Wound Dressing: Wound #17 Right,Dorsal Toe - Web between 1st and 2nd: Iodoflex Wound #24 Left,Dorsal Foot: Iodoflex Wound #35 Left Ischium: Hydrofera Blue Secondary Dressing: Wound #17 Right,Dorsal Toe - Web between 1st and 2nd: Kerlix/Rolled Gauze Dry Gauze Wound #24 Left,Dorsal Foot: Kerlix/Rolled Gauze Dry Gauze Heel Cup - add heel cup to left heel for protection. Wound #35 Left Ischium: Foam Border Edema Control: Elevate legs to the level of the heart or above for 30 minutes daily and/or when sitting, a frequency of: - throughout the day Support Garment 30-40 mm/Hg pressure to: - Juxtalite to both legs. Off-Loading: Low air-loss mattress (Group 2) Roho cushion for  wheelchair Turn and reposition every 2 hours - out of wheelchair throughout the day, try to lay on sides, sleep in the bed not the recliner The following medication(s) was prescribed: Lotrisone topical 1 %-0.05 % cream cream topical to affected areas starting 10/01/2019 #1 I have represcribed the Lotrisone but this will probably be the last time 2. Continued Hydrofera Blue to the buttock. I told him this is a pressure/shear injury. There is something he is doing to this area 3. I really do not understand the areas on the dorsal feet. I have treated him for tinea for a long period of time not really helpful. This could be severe venous hypertension I have been raising his legs etc. I sent him to dermatology not really helpful Electronic Signature(s) Signed: 10/01/2019 5:25:49 PM By: Linton Ham MD Entered By: Linton Ham on 10/01/2019 08:36:19 -------------------------------------------------------------------------------- [place] Details Patient Name: Date of Service: [name], [name] 10/01/2019 Medical Record [id number] Patient Account Number: [id number] Date of Birth/Sex: Treating RN: 09-Aug-1986 (33 y.o. [name] Primary Care Provider: [place], [place] Other Clinician: Referring Provider: Treating Provider/Extender:, [name], [name]: 195 Diagnosis Coding ICD-10 Codes Code Description L97.511 Non-pressure chronic ulcer of other part of right 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 L97.521 Non-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, complete 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, stage 3 Facility Procedures CPT4 Code Description: [id number] - DEB SUBQ TISSUE 20 SQ CM/< ICD-10 Diagnosis Description L97.521 Non-pressure chronic ulcer of other part of left foot limite L97.511 Non-pressure chronic ulcer of other part of right foot limit Modifier: d to breakdown ed to  breakdow Quantity: 1 of skin n of skin Physician Procedures Electronic Signature(s) Signed: 10/01/2019 5:25:49 PM By: Linton Ham MD Entered By: Linton Ham on 10/01/2019 08:36:30

## 2019-10-02 DIAGNOSIS — R635 Abnormal weight gain: Secondary | ICD-10-CM | POA: Diagnosis not present

## 2019-10-02 DIAGNOSIS — I10 Essential (primary) hypertension: Secondary | ICD-10-CM | POA: Diagnosis not present

## 2019-10-02 DIAGNOSIS — Z6829 Body mass index (BMI) 29.0-29.9, adult: Secondary | ICD-10-CM | POA: Diagnosis not present

## 2019-10-08 ENCOUNTER — Encounter (HOSPITAL_BASED_OUTPATIENT_CLINIC_OR_DEPARTMENT_OTHER): Payer: BC Managed Care – PPO | Admitting: Internal Medicine

## 2019-10-08 ENCOUNTER — Other Ambulatory Visit: Payer: Self-pay

## 2019-10-08 DIAGNOSIS — L97512 Non-pressure chronic ulcer of other part of right foot with fat layer exposed: Secondary | ICD-10-CM | POA: Diagnosis not present

## 2019-10-08 DIAGNOSIS — L97522 Non-pressure chronic ulcer of other part of left foot with fat layer exposed: Secondary | ICD-10-CM | POA: Diagnosis not present

## 2019-10-08 DIAGNOSIS — L89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, stage 3: Secondary | ICD-10-CM | POA: Diagnosis not present

## 2019-10-08 DIAGNOSIS — G8221 Paraplegia, complete: Secondary | ICD-10-CM | POA: Diagnosis not present

## 2019-10-08 NOTE — Progress Notes (Signed)
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 wound albeit not coming from the wound circumference. This is a bit of an odd situation in which the epithelialization seems to be coming from the surface of the wound rather than in the exact circumference. There is still small open areas mostly along the lateral margin of the wound. He has unchanged areas between the left first and second and the right first second toes which I been treating for tenia pedis 06/02/17; left lateral leg venous insufficiency wound. We have been using Hydrofera Blue. Somewhat smaller from the wound circumference. The surface of the wound remains a bit on it almost epithelialized sedation in appearance. I use an open curette today debridement in the surface of all of this especially the edges Small open wounds remaining on the dorsal right first and second toe interspace and the plantar left first second toe and her face on the left 06/09/17; wound on the left lateral leg continues to be smaller but very gradual and very dry surface using Hydrofera Blue 06/16/17 requires weekly debridements now on the left lateral leg  although this continues to contract. I changed to silver collagen last week because of dryness of the wound bed. Using Iodoflex to the areas on his first and second toes/web space bilaterally 06/24/17; patient with history of paraplegia also  chronic venous insufficiency with lymphedema. Has a very difficult wound on the left lateral leg. This has been gradually reducing in terms of with but comes in with a very dry adherent surface. High switch to silver collagen a week or so ago with hydrogel to keep the area moist. This is been refractory to multiple dressing attempts. He also has areas in his first and second toes bilaterally in the anterior and posterior web space. I had been using Iodoflex here after a prolonged course of silver alginate with ketoconazole was ineffective [question tinea pedis] 07/14/17; patient arrives today with a very difficult adherent material over his left lateral lower leg wound. He also has surrounding erythema and poorly controlled edema. He was switched his Santyl last visit which the nurses are applying once during his doctor visit and once on a nurse visit. He was also reduced to 2 layer compression I'm not exactly sure of the issue here. 07/21/17; better surface today after 1 week of Iodoflex. Significant cellulitis that we treated last week also better. [Doxycycline] 07/28/17 better surface today with now 2 weeks of Iodoflex. Significant cellulitis treated with doxycycline. He has now completed the doxycycline and he is back to his usual degree of chronic venous inflammation/stasis dermatitis. He reminds me he has had ablations surgery here 08/04/17; continued improvement with Iodoflex to the left lateral leg wound in terms of the surface of the wound although the dimensions are better. 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 Problematic areas on the plantar aspect of the first second toe web  space on the left and the dorsal aspect of the first second toe web space on the right. At one point I felt these were probably related to chronic fungal infections in treated him aggressively for this although we have not made any improvement here. 08/11/17; left lateral leg. Surface continues to improve with the Iodoflex although we are not seeing much improvement in overall wound dimensions. Areas on his plantar left foot and right foot show no improvement. In fact the right foot looks somewhat worse 08/18/17; left lateral leg. We changed to [facility] Blue last week after a prolonged course of Iodoflex which helps get the surface better. It appears that the wound with is improved. Continue with difficult areas on the left dorsal first second and plantar first second on the right 09/01/17; patient arrives in clinic today having had a temperature of 103 yesterday. He was seen in the ER and [facility]. The patient was concerned he could have cellulitis again in the right leg however they diagnosed him with a UTI and he is now on Keflex. He has a history of cellulitis which is been recurrent and difficult but this is been in the left leg, in the past 5 use doxycycline. He does in and out catheterizations at home which are risk factors for UTI 09/08/17; patient will be completing his Keflex this weekend. The erythema on the left leg is considerably better. He has a new wound today on the medial part of the right leg small superficial almost looks like a skin tear. He has worsening of the area on the right dorsal first and second toe. His major area on the left lateral leg is better. Using Hydrofera Blue on all areas 09/15/17; gradual reduction in width on the long wound in the left lateral leg. No debridement required. He also has wounds on the plantar aspect of his left first second toe  web space and on the dorsal aspect of the right first second toe web space. 09/22/17; there continues to be very  gradual improvements in the dimensions of the left lateral leg wound. He hasn't round erythematous spot with might be pressure on his wheelchair. There is no evidence obviously of infection no purulence no warmth He has a dry scaled area on the plantar aspect of the left first second toe Improved area on the dorsal right first second toe. 09/29/17; left lateral leg wound continues to improve in dimensions mostly with an is still a fairly long but increasingly narrow wound. He has a dry scaled area on the plantar aspect of his left first second toe web space Increasingly concerning area on the dorsal right first second toe. In fact I am concerned today about possible cellulitis around this wound. The areas extending up his second toe and although there is deformities here almost appears to abut on the nailbed. 10/06/17; left lateral leg wound continues to make very gradual progress. Tissue culture I did from the right first second toe dorsal foot last time grew MRSA and enterococcus which was vancomycin sensitive. This was not sensitive to clindamycin or doxycycline. He is allergic to Zyvox and sulfa we have therefore arrange for him to have dalvance infusion tomorrow. He is had this in the past and tolerated it well 10/20/17; left lateral leg wound continues to make decent progress. This is certainly reduced in terms of with there is advancing epithelialization.The cellulitis in the right foot looks better although he still has a deep wound in the dorsal aspect of the first second toe web space. Plantar left first toe web space on the left I think is making some progress 10/27/17; left lateral leg wound continues to make decent progress. Advancing epithelialization.using Hydrofera Blue The right first second toe web space wound is better-looking using silver alginate Improvement in the left plantar first second toe web space. Again using silver alginate 11/03/17 left lateral leg wound continues  to make decent progress albeit slowly. Using [facility] The right per second toe web space continues to be a very problematic looking punched out wound. I obtained a piece of tissue for deep culture I did extensively treated this for fungus. It is difficult to imagine that this is a pressure area as the patient states other than going outside he doesn't really wear shoes at home The left plantar first second toe web space looked fairly senescent. Necrotic edges. This required debridement change to [facility] Blue to all wound areas 11/10/17; left lateral leg wound continues to contract. Using Hydrofera Blue On the right dorsal first second toe web space dorsally. Culture I did of this area last week grew MRSA there is not an easy oral option in this patient was multiple antibiotic allergies or intolerances. This was only a rare culture isolate I'm therefore going to use Bactroban under silver alginate On the left plantar first second toe web space. Debridement is required here. This is also unchanged 11/17/17; left lateral leg wound continues to contract using Hydrofera Blue this is no longer the major issue. The major concern here is the right first second toe web space. He now has an open area going from dorsally to the plantar aspect. There is now wound on the inner lateral part of the first toe. Not a very viable surface on this. There is erythema spreading medially into the forefoot. No major change in the left first second toe plantar wound 11/24/17; left lateral  leg wound continues to contract using Hydrofera Blue. Nice improvement today The right first second toe web space all of this looks a lot less angry than last week. I have given him clindamycin and topical Bactroban for MRSA and terbinafine for the possibility of underlining tinea pedis that I could not control with ketoconazole. Looks somewhat better The area on the plantar left first second toe web space is weeping with dried  debris around the wound 12/01/17; left lateral leg wound continues to contract he Hydrofera Blue. It is becoming thinner in terms of with nevertheless it is making good improvement. The right first second toe web space looks less angry but still a large necrotic-looking wounds starting on the plantar aspect of the right foot extending between the toes and now extensively on the base of the right second toe. I gave him clindamycin and topical Bactroban for MRSA anterior benefiting for the possibility of underlying tinea pedis. Not looking better today The area on the left first/second toe looks better. Debrided of necrotic debris 12/05/17* the patient was worked in urgently today because over the weekend he found blood on his incontinence bad when he woke up. He was found to have an ulcer by his wife who does most of his wound care. He came in today for [place] to look at this. He has not had a history of wounds in his buttocks in spite of his paraplegia. 12/08/17; seen in follow-up today at his usual appointment. He was seen earlier this week and found to have a new wound on his buttock. We also follow him for wounds on the left lateral leg, left first second toe web space and right first second toe web space 12/15/17; we have been using Hydrofera Blue to the left lateral leg which has improved. The right first second toe web space has also improved. Left first second toe web space plantar aspect looks stable. The left buttock has worsened using Santyl. Apparently the buttock has drainage 12/22/17; we have been using Hydrofera Blue to the left lateral leg which continues to improve now 2 small wounds separated by normal skin. He tells [place] he had a fever up to 100 yesterday he is prone to UTIs but has not noted anything different. He does in and out catheterizations. The area between the first and second toes today does not look good necrotic surface covered with what looks to be purulent drainage and  erythema extending into the third toe. I had gotten this to something that I thought look better last time however it is not look good today. He also has a necrotic surface over the buttock wound which is expanded. I thought there might be infection under here so I removed a lot of the surface with a #5 curet though nothing look like it really needed culturing. He is been using Santyl to this area 12/27/17; his original wound on the left lateral leg continues to improve using Hydrofera Blue. I gave him samples of Baxdella although he was unable to take them out of fear for an allergic reaction ["lump in his throat"].the culture I did of the purulent drainage from his second toe last week showed both enterococcus and a set Enterobacter I was also concerned about the erythema on the bottom of his foot although paradoxically although this looks somewhat better today. 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 We've been using silver alginate here.  They came up today that he sleeps in a recliner, I'm not sure why but I asked him to stop this 01/03/18; his original wound we've been using Hydrofera Blue is now separated into 2 areas. Ulcer on his left buttock is better he is off the recliner and sleeping in bed Finally both wound areas between his first and second toes also looks some better 01/10/18; his original wound on the left lateral leg is now separated into 2 wounds we've been using Hydrofera Blue Ulcer on his left buttock has some drainage. There is a small probing site going into muscle layer superiorly.using silver alginate -He arrives today with a deep tissue injury on the left heel The wound on the dorsal aspect of his first second toe on the left looks a lot betterusing silver alginate ketoconazole The area on the first second toe web space on the right also looks a lot bette 01/17/18; his original  wound on the left lateral leg continues to progress using Hydrofera Blue Ulcer on his left buttock also is smaller surface healthier except for a small probing site going into the muscle layer superiorly. 2.4 cm of tunneling in this area DTI on his left heel we have only been offloading. Looks better than last week no threatened open no evidence of infection the wound on the dorsal aspect of the first second toe on the left continues to look like it's regressing we have only been using silver alginate and terbinafine orally The area in the first second toe web space on the right also looks to be a lot better using silver alginate and terbinafine I think this was prompted by tinea pedis 01/31/18; the patient was hospitalized in [place] last week apparently for a complicated UTI. He was discharged on cefepime he does in and out catheterizations. In the hospital he was discovered M I don't mild elevation of AST and ALTs and the terbinafine was stopped.predictably the pressure ulcer on his buttock looks betterusing silver alginate. The area on the left lateral leg also is better using Hydrofera Blue. The area between the first and second toes on the left better. First and second toes on the right still substantial but better. Finally the DTI on the left heel has held together and looks like it's resolving 02/07/18-he is here in follow-up evaluation for multiple ulcerations. He has new injury to the lateral aspect of the last issue a pressure ulcer, he states this is from adhesive removal trauma. He states he has tried multiple adhesive products with no success. All other ulcers appear stable. The left heel DTI is resolving. We will continue with same treatment plan and follow-up next week. 02/14/18; follow-up for multiple areas. He has a new area last week on the lateral aspect of his pressure ulcer more over the posterior trochanter. The original pressure ulcer looks quite stable has healthy  granulation. 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 Almost fully epithelialized on the original superior area using Hydrofera Blue DTI on the left heel has peeled off this week to reveal a small superficial wound under denuded skin and subcutaneous tissue Both areas between the first and second toes look better including nothing open on the left 02/21/18; The patient's wounds on his left ischial tuberosity and posterior left greater trochanter actually looked better. He has a large area of irritation around the area which I think is contact dermatitis. I am  doubtful that this is fungal His original wound on the left lateral calf continues to improve we have been using Hydrofera Blue There is no open area in the left first second toe web space although there is a lot of thick callus The DTI on the left heel required debridement today of necrotic surface eschar and subcutaneous tissue using silver alginate Finally the area on the right first second toe webspace continues to contract using silver alginate and ketoconazole 02/28/18 Left ischial tuberosity wounds look better using silver alginate. Original wound on the left calf only has one small open area left using Hydrofera Blue DTI on the left heel required debridement mostly removing skin from around this wound surface. Using silver alginate The areas on the right first/second toe web space using silver alginate and ketoconazole 03/08/18 on evaluation today patient appears to be doing decently well as best I can tell in regard to his wounds. This is the first time that I have seen him as he generally is followed by Dr. Dellia Nims. With that being said none of his wounds appear to be infected he does have an area where there is some skin covering what appears to be a new wound on the left dorsal surface of his great toe. This is right at the  nail bed. With that being said I do believe that debrided away some of the excess skin can be of benefit in this regard. Otherwise he has been tolerating the dressing changes without complication. 03/14/18; patient arrives today with the multiplicity of wounds that we are following. He has not been systemically unwell Original wound on the left lateral calf now only has 2 small open areas we've been using Hydrofera Blue which should continue The deep tissue injury on the left heel requires debridement today. We've been using silver alginate The left first second toe and the right first second toe are both are reminiscence what I think was tinea pedis. Apparently some of the callus Surface between the toes was removed last week when it started draining. Purulent drainage coming from the wound on the ischial tuberosity on the left. 03/21/18-He is here in follow-up evaluation for multiple wounds. 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 He tolerated debridement. The only change to last week's recommendations is to discontinue antifungal cream between toes. He will follow-up next week 03/28/18; following up for multiple wounds;Concern this week is streaking redness and swelling in the right foot. He is going to need antibiotics for this. 03/31/18; follow-up for right foot cellulitis. Streaking redness and swelling in the right foot on 03/28/18. 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 I put him on clindamycin 300 mg every 6 and brought him in for a quick check. He has an open wound between his first and second toes on the right foot as a potential source. 04/04/18; Right foot cellulitis is resolving he is completing clindamycin. This is truly good news Left lateral calf wound which is initial wound only has one small open area inferiorly this is close to healing out. He has compression stockings. We will  use Hydrofera Blue right down to the epithelialization of this Nonviable surface on the left heel which was initially pressure with a DTI. We've been using Hydrofera Blue. I'm going to switch this back to silver alginate Left first second toe/tinea pedis this looks better using silver alginate Right first second toe tinea pedis using silver alginate Large pressure  ulcers on theLeft ischial tuberosity. Small wound here Looks better. I am uncertain about the surface over the large wound. Using silver alginate 04/11/18; Cellulitis in the right foot is resolved Left lateral calf wound which was his original wounds still has 2 tiny open areas remaining this is just about closed Nonviable surface on the left heel is better but still requires debridement Left first second toe/tinea pedis still open using silver alginate Right first second toe wound tinea pedis I asked him to go back to using ketoconazole and silver alginate Large pressure ulcers on the left ischial tuberosity this shear injury here is resolved. Wound is smaller. No evidence of infection using silver alginate 04/18/18; Patient arrives with an intense area of cellulitis in the right mid lower calf extending into the right heel area. Bright red and warm. Smaller area on the left anterior leg. He has a significant history of MRSA. He will definitely need antibioticsdoxycycline He now has 2 open areas on the left ischial tuberosity the original large wound and now a satellite area which I think was above his initial satellite areas. Not a wonderful surface on this satellite area surrounding erythema which looks like pressure related. His left lateral calf wound again his original wound is just about closed Left heel pressure injury still requiring debridement Left first second toe looks a lot better using silver alginate Right first second toe also using silver alginate and ketoconazole cream also looks better 04/20/18; the patient was  worked in early today out of concerns with his cellulitis on the right leg. I had started him on doxycycline. This was 2 days ago. His wife was concerned about the swelling in the area. Also concerned about the left buttock. He has not been systemically unwell no fever chills. No nausea vomiting or diarrhea 04/25/18; the patient's left buttock wound is continued to deteriorate he is using Hydrofera Blue. He is still completing clindamycin for the cellulitis on the right leg although all of this looks better. 05/02/18 Left buttock wound still with a lot of drainage and a very tightly adherent fibrinous necrotic surface. He has a deeper area superiorly The left lateral calf wound is still closed DTI wound on the left heel necrotic surface especially the circumference using Iodoflex Areas between his left first second toe and right first second toe both look better. Dorsally and the right first second toe he had a necrotic surface although at smaller. In using silver alginate and ketoconazole. I did a culture last week which was a deep tissue culture of the reminiscence of the open wound on the right first second toe dorsally. This grew a few Acinetobacter and a few methicillin-resistant staph aureus. Nevertheless the area actually this week looked better. I didn't feel the need to specifically address this at least in terms of systemic antibiotics. 05/09/18; wounds are measuring larger more drainage per our intake. We are using Santyl covered with alginate on the large superficial buttock wounds, Iodosorb on the left heel, ketoconazole and silver alginate to the dorsal first and second toes bilaterally. 05/16/18; The area on his left buttock better in some aspects although the area superiorly over the ischial tuberosity required an extensive debridement.using Santyl Left heel appears stable. Using Iodoflex The areas between his first and second toes are not bad however there is spreading erythema up  the dorsal aspect of his left foot this looks like cellulitis again. He is insensate the erythema is really very brilliant.o Erysipelas He went to see an  allergist days ago because he was itching part of this he had lab work done. This showed a white count of 15.1 with 70% neutrophils. Hemoglobin of 11.4 and a platelet count of 659,000. Last white count we had in Epic was a 2-1/2 years ago which was 25.9 but he was ill at the time. He was able to show me some lab work that was done by his primary physician the pattern is about the same. 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 But prompted me to go ahead and do x-rays of both feet and the pelvis rule out osteomyelitis. He also had a comprehensive metabolic panel this was reasonably normal his albumin was 3.7 liver function tests BUN/creatinine all normal 05/23/18; x-rays of both his feet from last week were negative for underlying pulmonary abnormality. The x-ray of his pelvis however showed mild irregularity in the left ischial which may represent some early osteomyelitis. The wound in the left ischial continues to get deeper clearly now exposed muscle. Each week necrotic surface material over this area. Whereas the rest of the wounds do not look so bad. The left ischial wound we have been using Santyl and calcium alginate To the left heel surface necrotic debris using Iodoflex The left lateral leg is still healed Areas on the left dorsal foot and the right dorsal foot are about the same. There is some inflammation on the left which might represent contact dermatitis, fungal dermatitis I am doubtful cellulitis although this looks better than last week 05/30/18; CT scan done at Hospital did not show any osteomyelitis or abscess. Suggested the possibility of underlying cellulitis although I don't see a lot of evidence of this at the bedside The wound itself on the left buttock/upper thigh actually looks  somewhat better. No debridement Left heel also looks better no debridement continue Iodoflex Both dorsal first second toe spaces appear better using Lotrisone. Left still required debridement 06/06/18; Intake reported some purulent looking drainage from the left gluteal wound. Using Santyl and calcium alginate Left heel looks better although still a nonviable surface requiring debridement The left dorsal foot first/second webspace actually expanding and somewhat deeper. I may consider doing a shave biopsy of this area Right dorsal foot first/second webspace appears stable to improved. Using Lotrisone and silver alginate to both these areas 06/13/18 Left gluteal surface looks better. Now separated in the 2 wounds. No debridement required. Still drainage. We'll continue silver alginate Left heel continues to look better with Iodoflex continue this for at least another week Of his dorsal foot wounds the area on the left still has some depth although it looks better than last week. We've been using Lotrisone and silver alginate 06/20/18 Left gluteal continues to look better healthy tissue Left heel continues to look better healthy granulation wound is smaller. He is using Iodoflex and his long as this continues continue the Iodoflex Dorsal right foot looks better unfortunately dorsal left foot does not. There is swelling and erythema of his forefoot. He had minor trauma to this several days ago but doesn't think this was enough to have caused any tissue injury. Foot looks like cellulitis, we have had this problem before 06/27/18 on evaluation today patient appears to be doing a little worse in regard to his foot ulcer. 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 Both of which would really be his only options for treating this infection. In the past he has been  given and effusion of Orbactiv. This is done very well for him in the past again it's one time dosing IV antibiotic therapy. Subsequently I do believe this is something we're gonna need to see about doing at this point in time. Currently his other wounds seem to be doing somewhat better in my pinion I'm pretty happy in that regard. 07/03/18 on evaluation today patient's wounds actually appear to be doing fairly well. He has been tolerating the dressing changes without complication. All in all he seems to be showing signs of improvement. In regard to the antibiotics he has been dealing with infectious disease since I saw him last week as far as getting this scheduled. In the end [facility]'s [facility] this coming Friday. In the meantime he has been continuing to perform the dressing changes in such as previous. There does not appear to be any evidence of infection worsengin at this time. 07/10/18; Since I last saw this man 2 weeks ago things have actually improved. IV antibiotics of resulted in less forefoot erythema although there is still some present. He is not systemically unwell Left buttock wounds 2 now have no depth there is increased epithelialization Using silver alginate Left heel still requires debridement using Iodoflex Left dorsal foot still with a sizable wound about the size of a border but healthy granulation Right dorsal foot still with a slitlike area using silver alginate 07/18/18; the patient's cellulitis in the left foot is improved in fact I think it is on its way to resolving. Left buttock wounds 2 both look better although the larger one has hypertension granulation we've been using silver alginate Left heel has some thick circumferential redundant skin over the wound edge which will need to be removed today we've been using Iodoflex Left dorsal foot is still a sizable wound required debridement using silver alginate The  right dorsal foot is just about closed only a small open area remains here 07/25/18; left foot cellulitis is resolved Left buttock wounds 2 both look better. Hyper-granulation on the major area Left heel as some debris over the surface but otherwise looks a healthier wound. Using silver collagen Right dorsal foot is just about closed 07/31/18; arrives with our intake nurse worried about purulent drainage from the buttock. We had hyper-granulation here last week His buttock wounds 2 continue to look better Left heel some debris over the surface but measuring smaller. Right dorsal foot unfortunately has openings between the toes Left foot superficial wound looks less aggravated. 08/07/18 Buttock wounds continue to look better although some of her granulation and the larger medial wound. silver alginate Left heel continues to look a lot better.silver collagen Left foot superficial wound looks less stable. Requires debridement. He has a new wound superficial area on the foot on the lateral dorsal foot. Right foot looks better using silver alginate without Lotrisone 08/14/2018; patient was in the ER last week diagnosed with a UTI. He is now on Cefpodoxime and Macrodantin. Buttock wounds continued to be smaller. Using silver alginate Left heel continues to look better using silver collagen Left foot superficial wound looks as though it is improving Right dorsal foot area is just about healed. 08/21/2018; patient is completed his antibiotics for his UTI. He has 2 open areas on the buttocks. There is still not closed although the  surface looks satisfactory. Using silver alginate Left heel continues to improve using silver collagen The bilateral dorsal foot areas which are at the base of his first and second toes/possible tinea pedis are actually stable on the left but worse on the right. The area on the left required debridement of necrotic surface. After debridement I obtained a specimen for PCR  culture. The right dorsal foot which is been just about healed last week is now reopened 08/28/2018; culture done on the left dorsal foot showed coag negative staph both staph epidermidis and Lugdunensis. I think this is worthwhile initiating systemic treatment. I will use doxycycline given his long list of allergies. The area on the left heel slightly improved but still requiring debridement. The large wound on the buttock is just about closed whereas the smaller one is larger. Using silver alginate in this area 09/04/2018; patient is completing his doxycycline for the left foot although this continues to be a very difficult wound area with very adherent necrotic debris. We are using silver alginate to all his wounds right foot left foot and the small wounds on his buttock, silver collagen on the left heel. 09/11/2018; once again this patient has intense erythema and swelling of the left forefoot. Lesser degrees of erythema in the right foot. He has a long list of allergies and intolerances. I will reinstitute doxycycline. 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 Using silver alginate Left heel also looks better using silver collagen Unfortunately both the areas on his feet look worse. The area on the left first second webspace is now gone through to the plantar part of his foot. The area on the left foot anteriorly is irritated with erythema and swelling in the forefoot. 09/25/2018 His wound on the left plantar heel looks better. Using silver collagen The area on the left buttock 2 small remnant areas. One is closed one is still open. Using silver alginate The areas between both his first and second toes look worse. This in spite of long-standing antifungal therapy with ketoconazole and silver alginate which should have antifungal activity He has small areas around his original wound on the left calf one is on the bottom of the original scar  tissue and one superiorly both of these are small and superficial but again given wound history in this site this is worrisome 10/02/2018 Left plantar heel continues to gradually contract using silver collagen Left buttock wound is unchanged using silver alginate The areas on his dorsal feet between his first and second toes bilaterally look about the same. I prescribed clindamycin ointment to see if we can address chronic staph colonization and also the underlying possibility of erythrasma The left lateral lower extremity wound is actually on the lateral part of his ankle. Small open area here. We have been using silver alginate 10/09/2018; Left plantar heel continues to look healthy and contract. No debridement is required Left buttock slightly smaller with a tape injury wound just below which was new this week Dorsal feet somewhat improved I have been using clindamycin Left lateral looks lower extremity the actual open area looks worse although a lot of this is epithelialized. I am going to change to silver collagen today He has a lot more swelling in the right leg although this is not pitting not red and not particularly warm there is a lot of spasm in the right leg usually indicative of people with paralysis of some underlying discomfort. We  have reviewed his vascular status from 2017 he had a left greater saphenous vein ablation. I wonder about referring him back to vascular surgery if the area on the left leg continues to deteriorate. 10/16/2018 in today for follow-up and management of multiple lower extremity ulcers. His left Buttock wound is much lower smaller and almost closed completely. The wound to the left ankle has began to reopen with Epithelialization and some adherent slough. He has multiple new areas to the left foot and leg. The left dorsal foot without much improvement. Wound present between left great webspace and 2nd toe. Erythema and edema present right leg. Right LE  ultrasound obtained on 10/10/18 was negative for DVT. 10/23/2018; Left buttock is closed over. Still dry macerated skin but there is no open wound. I suspect this is chronic pressure/moisture Left lateral calf is quite a bit worse than when I saw this last. There is clearly drainage here he has macerated skin into the left plantar heel. We will change the primary dressing to alginate Left dorsal foot has some improvement in overall wound area. Still using clindamycin and silver alginate Right dorsal foot about the same as the left using clindamycin and silver alginate The erythema in the right leg has resolved. He is DVT rule out was negative Left heel pressure area required debridement although the wound is smaller and the surface is health 10/26/2018 The patient came back in for his nurse check today predominantly because of the drainage coming out of the left lateral leg with a recent reopening of his original wound on the left lateral calf. He comes in today with a large amount of surrounding erythema around the wound extending from the calf into the ankle and even in the area on the dorsal foot. He is not systemically unwell. He is not febrile. Nevertheless this looks like cellulitis. We have been using silver alginate to the area. I changed him to a regular visit and I am going to prescribe him doxycycline. The rationale here is a long list of medication intolerances and a history of MRSA. I did not see anything that I thought would provide a valuable culture 10/30/2018 Follow-up from his appointment 4 days ago with really an extensive area of cellulitis in the left calf left lateral ankle and left dorsal foot. I put him on doxycycline. He has a long list of medication allergies which are true allergy reactions. 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 In any case he is a lot better today. The erythema especially in the  anterior and lateral left calf is better. He still has left ankle erythema. He also is complaining about increasing edema in the right leg we have only been using Kerlix Coban and he has been doing the wraps at home. Finally he has a spotty rash on the medial part of his upper left calf which looks like folliculitis or perhaps wrap occlusion type injury. Small superficial macules not pustules 11/06/18 patient arrives today with again a considerable degree of erythema around the wound on the left lateral calf extending into the dorsal ankle and dorsal foot. This is a lot worse than when I saw this last week. He is on doxycycline really with not a lot of improvement. He has not been systemically unwell Wounds on the; left heel actually looks improved. Original area on the left foot and proximity to the first and second toes looks about the same. He has  superficial areas on the dorsal foot, anterior calf and then the reopening of his original wound on the left lateral calf which looks about the same The only area he has on the right is the dorsal webspace first and second which is smaller. He has a large area of dry erythematous skin on the left buttock small open area here. 11/13/2018; the patient arrives in much better condition. The erythema around the wound on the left lateral calf is a lot better. Not sure whether this was the clindamycin or the TCA and ketoconazole or just in the improvement in edema control [stasis dermatitis]. In any case this is a lot better. The area on the left heel is very small and just about resolved using silver collagen we have been using silver alginate to the areas on his dorsal feet 11/20/2018; his wounds include the left lateral calf, left heel, dorsal aspects of both feet just proximal to the first second webspace. He is stable to slightly improved. I did not think any changes to his dressings were going to be necessary 11/27/2018 he has a reopening on the left  buttock which is surrounded by what looks like tinea or perhaps some other form of dermatitis. The area on the left dorsal foot has some erythema around it I have marked this area but I am not sure whether this is cellulitis or not. Left heel is not closed. Left calf the reopening is really slightly longer and probably worse 1/13; in general things look better and smaller except for the left dorsal foot. Area on the left heel is just about closed, left buttock looks better only a small wound remains in the skin looks better [using Lotrisone] 1/20; the area on the left heel only has a few remaining open areas here. Left lateral calf about the same in terms of size, left dorsal foot slightly larger right lateral foot still not closed. The area on the left buttock has no open wound and the surrounding skin looks a lot better 1/27; the area on the left heel is closed. Left lateral calf better but still requiring extensive debridements. The area on his left buttock is closed. He still has the open areas on the left dorsal foot which is slightly smaller in the right foot which is slightly expanded. We have been using Iodoflex on these areas as well 2/3; left heel is closed. Left lateral calf still requiring debridement using Iodoflex there is no open area on his left buttock however he has dry scaly skin over a large area of this. Not really responding well to the Lotrisone. Finally the areas on his dorsal feet at the level of the first second webspace are slightly smaller on the right and about the same on the left. Both of these vigorously debrided with Anasept and gauze 2/10; left heel remains closed he has dry erythematous skin over the left buttock but there is no open wound here. Left lateral leg has come in and with. Still requiring debridement we have been using Iodoflex here. Finally the area on the left dorsal foot and right dorsal foot are really about the same extremely dry callused fissured  areas. He does not yet have a dermatology appointment 2/17; left heel remains closed. He has a new open area on the left buttock. The area on the left lateral calf is bigger longer and still covered in necrotic debris. No major change in his foot areas bilaterally. I am awaiting for a dermatologist to look on  this. We have been using ketoconazole I do not know that this is been doing any good at all. 2/24; left heel remains closed. The left buttock wound that was new reopening last week looks better. The left lateral calf appears better also although still requires debridement. The major area on his foot is the left first second also requiring debridement. We have been putting Prisma on all wounds. I do not believe that the ketoconazole has done too much good for his feet. He will use Lotrisone I am going to give him a 2-week course of terbinafine. We still do not have a dermatology appointment 3/2 left heel remains closed however there is skin over bone in this area I pointed this out to him today. The left buttock wound is epithelialized but still does not look completely stable. The area on the left leg required debridement were using silver collagen here. With regards to his feet we changed to Lotrisone last week and silver alginate. 3/9; left heel remains closed. Left buttock remains closed. The area on the right foot is essentially closed. The left foot remains unchanged. Slightly smaller on the left lateral calf. Using silver collagen to both of these areas 3/16-Left heel remains closed. Area on right foot is closed. Left lateral calf above the lateral malleolus open wound requiring debridement with easy bleeding. Left dorsal wound proximal to first toe also debrided. Left ischial area open new. Patient has been using Prisma with wrapping every 3 days. Dermatology appointment is apparently tomorrow.Patient has completed his terbinafine 2-week course with some apparent improvement according  to him, there is still flaking and dry skin in his foot on the left 3/23; area on the right foot is reopened. The area on the left anterior foot is about the same still a very necrotic adherent surface. He still has the area on the left leg and reopening is on the left buttock. He apparently saw dermatology although I do not have a note. According to the patient who is usually fairly well informed they did not have any good ideas. Put him on oral terbinafine which she is been on before. 3/30; using silver collagen to all wounds. Apparently his dermatologist put him on doxycycline and rifampin presumably some culture grew staph. I do not have this result. He remains on terbinafine although I have used terbinafine on him before 4/6; patient has had a fairly substantial reopening on the right foot between the first and second toes. He is finished his terbinafine and I believe is on doxycycline and rifampin still as prescribed by dermatology. We have been using silver collagen to all his wounds although the patient reports that he thinks silver alginate does better on the wounds on his buttock. 4/13; the area on his left lateral calf about the same size but it did not require debridement. Left dorsal foot just proximal to the webspace between the first and second toes is about the same. Still nonviable surface. I note some superficial bronze discoloration of the dorsal part of his foot Right dorsal foot just proximal to the first and second toes also looks about the same. I still think there may be the same discoloration I noted above on the left Left buttock wound looks about the same 4/20; left lateral calf appears to be gradually contracting using silver collagen. 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 The left dorsal foot wound is debrided of tightly adherent necrotic debris and really cleans up quite  nicely. The right area is  worse with expansion. I did not debride this it is now over the base of the second toe The area on his left buttock is smaller no debridement is required using silver collagen 5/4; left calf continues to make good progress. He arrives with erythema around the wounds on his dorsal foot which even extends to the plantar aspect. Very concerning for coexistent infection. He is finished the erythromycin I gave him for possible erythrasma this does not seem to have helped. The area on the left foot is about the same base of the dorsal toes Is area on the buttock looks improved on the left 5/11; left calf and left buttock continued to make good progress. Left foot is about the same to slightly improved. Major problem is on the right foot. He has not had an x-ray. Deep tissue culture I did last week showed both Enterobacter and E. coli. I did not change the doxycycline I put him on empirically although neither 1 of these were plated to doxycycline. He arrives today with the erythema looking worse on both the dorsal and plantar foot. Macerated skin on the bottom of the foot. he has not been systemically unwell 5/18-Patient returns at 1 week, left calf wound appears to be making some progress, left buttock wound appears slightly worse than last time, left foot wound looks slightly better, right foot redness is marginally better. X-ray of both feet show no air or evidence of osteomyelitis. Patient is finished his Omnicef and terbinafine. He continues to have macerated skin on the bottom of the left foot as well as right 5/26; left calf wound is better, left buttock wound appears to have multiple small superficial open areas with surrounding macerated skin. X-rays that I did last time showed no evidence of osteomyelitis in either foot. He is finished cefdinir and doxycycline. I do not think that he was on terbinafine. He continues to have a large superficial open area on the right foot anterior dorsal and  slightly between the first and second toes. I did send him to dermatology 2 months ago or so wondering about whether they would do a fungal scraping. I do not believe they did but did do a culture. We have been using silver alginate to the toe areas, he has been using antifungals at home topically either ketoconazole or Lotrisone. We are using silver collagen on the left foot, silver alginate on the right, silver collagen on the left lateral leg and silver alginate on the left buttock 6/1; left buttock area is healed. We have the left dorsal foot, left lateral leg and right dorsal foot. We are using silver alginate to the areas on both feet and silver collagen to the area on his left lateral calf 6/8; the left buttock apparently reopened late last week. He is not really sure how this happened. He is tolerating the terbinafine. Using silver alginate to all wounds 6/15; left buttock wound is larger than last week but still superficial. Came in the clinic today with a report of purulence from the left lateral leg I did not identify any infection Both areas on his dorsal feet appear to be better. He is tolerating the terbinafine. Using silver alginate to all wounds 6/22; left buttock is about the same this week, left calf quite a bit better. His left foot is about the same however he comes in with erythema and warmth in the right forefoot once again. Culture that I gave him in the beginning  of May showed Enterobacter and E. coli. I gave him doxycycline and things seem to improve although neither 1 of these organisms was specifically plated. 6/29; left buttock is larger and dry this week. Left lateral calf looks to me to be improved. Left dorsal foot also somewhat improved right foot completely unchanged. The erythema on the right foot is still present. He is completing the Ceftin dinner that I gave him empirically [see discussion above.) 7/6 - All wounds look to be stable and perhaps improved, the  left buttock wound is slightly smaller, per patient bleeds easily, completed ceftin, the right foot redness is less, he is on terbinafine 7/13; left buttock wound about the same perhaps slightly narrower. Area on the left lateral leg continues to narrow. Left dorsal foot slightly smaller right foot about the same. We are using silver alginate on the right foot and Hydrofera Blue to the areas on the left. Unna boot on the left 2 layer compression on the right 7/20; left buttock wound absolutely the same. Area on lateral leg continues to get better. Left dorsal foot require debridement as did the right no major change in the 7/27; left buttock wound the same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 silver alginate to her original wound on the left leg. The wounds in his toes on the right are closed there is a small open area on the base of the left second toe 11/26/15; the patient's right leg is much better although there is still some edema here this could be reminiscent  from his severe cellulitis  likely on top of some degree of lymphedema. His left anterior leg wound has less surface slough as reported by her intake nurse. Small wound at the base of the left second toe 12/02/16; patient's right leg is better and there is no open wound here. His left anterior lateral leg wound continues to have a healthy-looking surface. Small wound at the base of the left second toe however there is erythema in the left forefoot which is worrisome 12/16/16; is no open wounds on his right leg. We took measurements for stockings. His left anterior lateral leg wound continues to have a healthy-looking surface. I'm not sure where we were with the Apligraf run through his insurance. We have been using Iodoflex. He has a thick eschar on the left first second toe interface, I suspect this may be fungal however there is no visible open 12/23/16; no open wound on his right leg. He has 2 small areas left of the linear wound that was remaining last week. We have been using Prisma, I thought I have disclosed this week, we can only look forward to next week 01/03/17; the patient had concerning areas of erythema last week, already on doxycycline for UTI through his primary doctor. The erythema is absolutely no better there is warmth and swelling both medially from the left lateral leg wound and also the dorsal left foot. 01/06/17- Patient is here for follow-up evaluation of his left lateral leg ulcer and bilateral feet ulcers. He is on oral antibiotic therapy, tolerating that. Nursing staff and the patient states that the erythema is improved from Monday. 01/13/17; the predominant left lateral leg wound continues to be problematic. I had put Apligraf on him earlier this month once. However he subsequently developed what appeared to be an intense cellulitis around the left lateral leg wound. I gave him Dalvance I think on 2/12 perhaps 2/13 he continues on cefdinir. The erythema is still present but the warmth and swelling is  improved. I am hopeful that the cellulitis part of this control. I wouldn't be surprised if there is an element of venous inflammation as well. 01/17/17. The erythema is present but better in the left leg. His left lateral leg wound still does not have a viable surface buttons certain parts of this long thin wound it appears like there has been improvement in dimensions. 01/20/17; the erythema still present but much better in the left leg. I'm thinking this is his usual degree of chronic venous inflammation. The wound on the left leg looks somewhat better. Is less surface slough 01/27/17; erythema is back to the chronic venous inflammation. The wound on the left leg is somewhat better. I am back to the point where I like to try an Apligraf once again 02/10/17; slight improvement in wound dimensions. Apligraf #2. He is completing his doxycycline 02/14/17; patient arrives today having completed doxycycline last Thursday. This was supposed to be a nurse visit however once again he hasn't tense erythema from the medial part of his wound extending over the lower leg. Also erythema in his foot this is roughly in the same distribution as last time. He has baseline chronic venous inflammation however this is a lot worse than the baseline I have learned to accept the on him is baseline inflammation 02/24/17- patient is here for follow-up evaluation. He is tolerating compression therapy. His voicing no complaints or concerns he is here anticipating an Apligraf 03/03/17; he arrives today with an adherent necrotic surface. I don't  think this is surface is going to be amenable for Apligraf's. The erythema around his wound and on the left dorsal foot has resolved he is off antibiotics 03/10/17; better-looking surface today. I don't think he can tolerate Apligraf's. He tells me he had a wound VAC after a skin graft years ago to this area and they had difficulty with a seal. The erythema continues to be stable around this  some degree of chronic venous inflammation but he also has recurrent cellulitis. We have been using Iodoflex 03/17/17; continued improvement in the surface and may be small changes in dimensions. Using Iodoflex which seems the only thing that will control his surface 03/24/17- He is here for follow up evaluation of his LLE lateral ulceration and ulcer to right dorsal foot/toe space. He is voicing no complaints or concerns, He is tolerating compression wrap. 03/31/17 arrives today with a much healthier looking wound on the left lower extremity. We have been using Iodoflex for a prolonged period of time which has for the first time prepared and adequate looking wound bed although we have not had much in the way of wound dimension improvement. He also has a small wound between the first and second toe on the right 04/07/17; arrives today with a healthy-looking wound bed and at least the top 50% of this wound appears to be now her. No debridement was required I have changed him to [facility] last week after prolonged Iodoflex. He did not do well with Apligraf's. We've had a re-opening between the first and second toe on the right 04/14/17; arrives today with a healthier looking wound bed contractions and the top 50% of this wound and some on the lesser 50%. Wound bed appears healthy. The area between the first and second toe on the right still remains problematic 04/21/17; continued very gradual improvement. Using [facility] 04/28/17; continued very gradual improvement in the left lateral leg venous insufficiency wound. His periwound erythema is very mild. We have been using Hydrofera Blue. Wound is making progress especially in the superior 50% 05/05/17; he continues to have very gradual improvement in the left lateral venous insufficiency wound. Both in terms with an length rings are improving. I debrided this every 2 weeks with #5 curet and we have been using Hydrofera Blue and again making good  progress With regards to the wounds between his right first and second toe which I thought might of been tinea pedis he is not making as much progress very dry scaly skin over the area. Also the area at the base of the left first and second toe in a similar condition 05/12/17; continued gradual improvement in the refractory left lateral venous insufficiency wound on the left. Dimension smaller. Surface still requiring debridement using Hydrofera Blue 05/19/17; continued gradual improvement in the refractory left lateral venous ulceration. Careful inspection of the wound bed underlying rumination suggested some degree of epithelialization over the surface no debridement indicated. Continue Hydrofera Blue difficult areas between his toes first and third on the left than first and second on the right. I'm going to change to silver alginate from silver collagen. Continue ketoconazole as I suspect underlying tinea pedis 05/26/17; left lateral leg venous insufficiency wound. We've been using Hydrofera Blue. I believe that there is expanding epithelialization over the surface of the wound albeit not coming from the wound circumference. This is a bit of an odd situation in which the epithelialization seems to be coming from the surface of the wound rather than in the  exact circumference. There is still small open areas mostly along the lateral margin of the wound. ooHe has unchanged areas between the left first and second and the right first second toes which I been treating for tenia pedis 06/02/17; left lateral leg venous insufficiency wound. We have been using Hydrofera Blue. Somewhat smaller from the wound circumference. The surface of the wound remains a bit on it almost epithelialized sedation in appearance. I use an open curette today debridement in the surface of all of this especially the edges ooSmall open wounds remaining on the dorsal right first and second toe interspace and the plantar left  first second toe and her face on the left 06/09/17; wound on the left lateral leg continues to be smaller but very gradual and very dry surface using Hydrofera Blue 06/16/17 requires weekly debridements now on the left lateral leg although this continues to contract. I changed to silver collagen last week because of dryness of the wound bed. Using Iodoflex to the areas on his first and second toes/web space bilaterally 06/24/17; patient with history of paraplegia also chronic venous insufficiency with lymphedema. Has a very difficult wound on the left lateral leg. This has been gradually reducing in terms of with but comes in with a very dry adherent surface. High switch to silver collagen a week or so ago with hydrogel to keep the area moist. This is been refractory to multiple dressing attempts. He also has areas in his first and second toes bilaterally in the anterior and posterior web space. I had been using Iodoflex here after a prolonged course of silver alginate with ketoconazole was ineffective [question tinea pedis] 07/14/17; patient arrives today with a very difficult adherent material over his left lateral lower leg wound. He also has surrounding erythema and poorly controlled edema. He was switched his Santyl last visit which the nurses are applying once during his doctor visit and once on a nurse visit. He was also reduced to 2 layer compression I'm not exactly sure of the issue here. 07/21/17; better surface today after 1 week of Iodoflex. Significant cellulitis that we treated last week also better. [Doxycycline] 07/28/17 better surface today with now 2 weeks of Iodoflex. Significant cellulitis treated with doxycycline. He has now completed the doxycycline and he is back to his usual degree of chronic venous inflammation/stasis dermatitis. He reminds me he has had ablations surgery here 08/04/17; continued improvement with Iodoflex to the left lateral leg wound in terms of the surface of  the wound although the dimensions are better. 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 Problematic areas on the plantar aspect of the first second toe web space on the left and the dorsal aspect of the first second toe web space on the right. At one point I felt these were probably related to chronic fungal infections in treated him aggressively for this although we have not made any improvement here. 08/11/17; left lateral leg. Surface continues to improve with the Iodoflex although we are not seeing much improvement in overall wound dimensions. Areas on his plantar left foot and right foot show no improvement. In fact the right foot looks somewhat worse 08/18/17; left lateral leg. We changed to [facility] Blue last week after a prolonged course of Iodoflex which helps get the surface better. It appears that the wound with is improved. Continue with difficult areas on the left dorsal first second and plantar first second on the right 09/01/17;  patient arrives in clinic today having had a temperature of 103 yesterday. He was seen in the ER and [facility]. The patient was concerned he could have cellulitis again in the right leg however they diagnosed him with a UTI and he is now on Keflex. He has a history of cellulitis which is been recurrent and difficult but this is been in the left leg, in the past 5 use doxycycline. He does in and out catheterizations at home which are risk factors for UTI 09/08/17; patient will be completing his Keflex this weekend. The erythema on the left leg is considerably better. He has a new wound today on the medial part of the right leg small superficial almost looks like a skin tear. He has worsening of the area on the right dorsal first and second toe. His major area on the left lateral leg is better. Using Hydrofera Blue on all areas 09/15/17; gradual reduction in width on the long wound in the  left lateral leg. No debridement required. He also has wounds on the plantar aspect of his left first second toe web space and on the dorsal aspect of the right first second toe web space. 09/22/17; there continues to be very gradual improvements in the dimensions of the left lateral leg wound. He hasn't round erythematous spot with might be pressure on his wheelchair. There is no evidence obviously of infection no purulence no warmth ooHe has a dry scaled area on the plantar aspect of the left first second toe ooImproved area on the dorsal right first second toe. 09/29/17; left lateral leg wound continues to improve in dimensions mostly with an is still a fairly long but increasingly narrow wound. ooHe has a dry scaled area on the plantar aspect of his left first second toe web space ooIncreasingly concerning area on the dorsal right first second toe. In fact I am concerned today about possible cellulitis around this wound. The areas extending up his second toe and although there is deformities here almost appears to abut on the nailbed. 10/06/17; left lateral leg wound continues to make very gradual progress. Tissue culture I did from the right first second toe dorsal foot last time grew MRSA and enterococcus which was vancomycin sensitive. This was not sensitive to clindamycin or doxycycline. He is allergic to Zyvox and sulfa we have therefore arrange for him to have dalvance infusion tomorrow. He is had this in the past and tolerated it well 10/20/17; left lateral leg wound continues to make decent progress. This is certainly reduced in terms of with there is advancing epithelialization.ooThe cellulitis in the right foot looks better although he still has a deep wound in the dorsal aspect of the first second toe web space. Plantar left first toe web space on the left I think is making some progress 10/27/17; left lateral leg wound continues to make decent progress. Advancing  epithelialization.using Hydrofera Blue ooThe right first second toe web space wound is better-looking using silver alginate ooImprovement in the left plantar first second toe web space. Again using silver alginate 11/03/17 left lateral leg wound continues to make decent progress albeit slowly. Using Hydrofera Blue ooThe right per second toe web space continues to be a very problematic looking punched out wound. I obtained a piece of tissue for deep culture I did extensively treated this for fungus. It is difficult to imagine that this is a pressure area as the patient states other than going outside he doesn't really wear shoes at home ooThe left  plantar first second toe web space looked fairly senescent. Necrotic edges. This required debridement oochange to Hydrofera Blue to all wound areas 11/10/17; left lateral leg wound continues to contract. Using Hydrofera Blue ooOn the right dorsal first second toe web space dorsally. Culture I did of this area last week grew MRSA there is not an easy oral option in this patient was multiple antibiotic allergies or intolerances. This was only a rare culture isolate I'm therefore going to use Bactroban under silver alginate ooOn the left plantar first second toe web space. Debridement is required here. This is also unchanged 11/17/17; left lateral leg wound continues to contract using Hydrofera Blue this is no longer the major issue. ooThe major concern here is the right first second toe web space. He now has an open area going from dorsally to the plantar aspect. There is now wound on the inner lateral part of the first toe. Not a very viable surface on this. There is erythema spreading medially into the forefoot. ooNo major change in the left first second toe plantar wound 11/24/17; left lateral leg wound continues to contract using Hydrofera Blue. Nice improvement today ooThe right first second toe web space all of this looks a lot less angry  than last week. I have given him clindamycin and topical Bactroban for MRSA and terbinafine for the possibility of underlining tinea pedis that I could not control with ketoconazole. Looks somewhat better ooThe area on the plantar left first second toe web space is weeping with dried debris around the wound 12/01/17; left lateral leg wound continues to contract he Hydrofera Blue. It is becoming thinner in terms of with nevertheless it is making good improvement. ooThe right first second toe web space looks less angry but still a large necrotic-looking wounds starting on the plantar aspect of the right foot extending between the toes and now extensively on the base of the right second toe. I gave him clindamycin and topical Bactroban for MRSA anterior benefiting for the possibility of underlying tinea pedis. Not looking better today ooThe area on the left first/second toe looks better. Debrided of necrotic debris 12/05/17* the patient was worked in urgently today because over the weekend he found blood on his incontinence bad when he woke up. He was found to have an ulcer by his wife who does most of his wound care. He came in today for [place] to look at this. He has not had a history of wounds in his buttocks in spite of his paraplegia. 12/08/17; seen in follow-up today at his usual appointment. He was seen earlier this week and found to have a new wound on his buttock. We also follow him for wounds on the left lateral leg, left first second toe web space and right first second toe web space 12/15/17; we have been using Hydrofera Blue to the left lateral leg which has improved. The right first second toe web space has also improved. Left first second toe web space plantar aspect looks stable. The left buttock has worsened using Santyl. Apparently the buttock has drainage 12/22/17; we have been using Hydrofera Blue to the left lateral leg which continues to improve now 2 small wounds separated by  normal skin. He tells [place] he had a fever up to 100 yesterday he is prone to UTIs but has not noted anything different. He does in and out catheterizations. The area between the first and second toes today does not look good necrotic surface covered with what looks to  be purulent drainage and erythema extending into the third toe. I had gotten this to something that I thought look better last time however it is not look good today. He also has a necrotic surface over the buttock wound which is expanded. I thought there might be infection under here so I removed a lot of the surface with a #5 curet though nothing look like it really needed culturing. He is been using Santyl to this area 12/27/17; his original wound on the left lateral leg continues to improve using Hydrofera Blue. I gave him samples of Baxdella although he was unable to take them out of fear for an allergic reaction ["lump in his throat"].the culture I did of the purulent drainage from his second toe last week showed both enterococcus and a set Enterobacter I was also concerned about the erythema on the bottom of his foot although paradoxically although this looks somewhat better today. 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 We've been using silver alginate here. They came up today that he sleeps in a recliner, I'm not sure why but I asked him to stop this 01/03/18; his original wound we've been using Hydrofera Blue is now separated into 2 areas. ooUlcer on his left buttock is better he is off the recliner and sleeping in bed ooFinally both wound areas between his first and second toes also looks some better 01/10/18; his original wound on the left lateral leg is now separated into 2 wounds we've been using Hydrofera Blue ooUlcer on his left buttock has some drainage. There is a small probing site going into muscle layer  superiorly.using silver alginate -He arrives today with a deep tissue injury on the left heel ooThe wound on the dorsal aspect of his first second toe on the left looks a lot betterusing silver alginate ketoconazole ooThe area on the first second toe web space on the right also looks a lot bette 01/17/18; his original wound on the left lateral leg continues to progress using Hydrofera Blue ooUlcer on his left buttock also is smaller surface healthier except for a small probing site going into the muscle layer superiorly. 2.4 cm of tunneling in this area ooDTI on his left heel we have only been offloading. Looks better than last week no threatened open no evidence of infection oothe wound on the dorsal aspect of the first second toe on the left continues to look like it's regressing we have only been using silver alginate and terbinafine orally ooThe area in the first second toe web space on the right also looks to be a lot better using silver alginate and terbinafine I think this was prompted by tinea pedis 01/31/18; the patient was hospitalized in [place] last week apparently for a complicated UTI. He was discharged on cefepime he does in and out catheterizations. In the hospital he was discovered M I don't mild elevation of AST and ALTs and the terbinafine was stopped.predictably the pressure ulcer on his buttock looks betterusing silver alginate. The area on the left lateral leg also is better using Hydrofera Blue. The area between the first and second toes on the left better. First and second toes on the right still substantial but better. Finally the DTI on the left heel has held together and looks like it's resolving 02/07/18-he is here in follow-up evaluation for multiple ulcerations. He has new injury to the lateral aspect of the last issue  a pressure ulcer, he states this is from adhesive removal trauma. He states he has tried multiple adhesive products with no success. All other  ulcers appear stable. The left heel DTI is resolving. We will continue with same treatment plan and follow-up next week. 02/14/18; follow-up for multiple areas. ooHe has a new area last week on the lateral aspect of his pressure ulcer more over the posterior trochanter. The original pressure ulcer looks quite stable has healthy granulation. 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 Almost fully epithelialized on the original superior area using Hydrofera Blue ooDTI on the left heel has peeled off this week to reveal a small superficial wound under denuded skin and subcutaneous tissue ooBoth areas between the first and second toes look better including nothing open on the left 02/21/18; ooThe patient's wounds on his left ischial tuberosity and posterior left greater trochanter actually looked better. He has a large area of irritation around the area which I think is contact dermatitis. I am doubtful that this is fungal ooHis original wound on the left lateral calf continues to improve we have been using Hydrofera Blue ooThere is no open area in the left first second toe web space although there is a lot of thick callus ooThe DTI on the left heel required debridement today of necrotic surface eschar and subcutaneous tissue using silver alginate ooFinally the area on the right first second toe webspace continues to contract using silver alginate and ketoconazole 02/28/18 ooLeft ischial tuberosity wounds look better using silver alginate. ooOriginal wound on the left calf only has one small open area left using Hydrofera Blue ooDTI on the left heel required debridement mostly removing skin from around this wound surface. Using silver alginate ooThe areas on the right first/second toe web space using silver alginate and ketoconazole 03/08/18 on evaluation today patient appears to be  doing decently well as best I can tell in regard to his wounds. This is the first time that I have seen him as he generally is followed by Dr. Dellia Nims. With that being said none of his wounds appear to be infected he does have an area where there is some skin covering what appears to be a new wound on the left dorsal surface of his great toe. This is right at the nail bed. With that being said I do believe that debrided away some of the excess skin can be of benefit in this regard. Otherwise he has been tolerating the dressing changes without complication. 03/14/18; patient arrives today with the multiplicity of wounds that we are following. He has not been systemically unwell ooOriginal wound on the left lateral calf now only has 2 small open areas we've been using Hydrofera Blue which should continue ooThe deep tissue injury on the left heel requires debridement today. We've been using silver alginate ooThe left first second toe and the right first second toe are both are reminiscence what I think was tinea pedis. Apparently some of the callus Surface between the toes was removed last week when it started draining. ooPurulent drainage coming from the wound on the ischial tuberosity on the left. 03/21/18-He is here in follow-up evaluation for multiple wounds. 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 He tolerated debridement. The only change to last week's recommendations is to discontinue antifungal cream between toes. He will follow-up next week 03/28/18; following  up for multiple wounds;Concern this week is streaking redness and swelling in the right foot. He is going to need antibiotics for this. 03/31/18; follow-up for right foot cellulitis. Streaking redness and swelling in the right foot on 03/28/18. 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 I put him on clindamycin 300 mg every 6 and brought  him in for a quick check. He has an open wound between his first and second toes on the right foot as a potential source. 04/04/18; ooRight foot cellulitis is resolving he is completing clindamycin. This is truly good news ooLeft lateral calf wound which is initial wound only has one small open area inferiorly this is close to healing out. He has compression stockings. We will use Hydrofera Blue right down to the epithelialization of this ooNonviable surface on the left heel which was initially pressure with a DTI. We've been using Hydrofera Blue. I'm going to switch this back to silver alginate ooLeft first second toe/tinea pedis this looks better using silver alginate ooRight first second toe tinea pedis using silver alginate ooLarge pressure ulcers on theLeft ischial tuberosity. Small wound here Looks better. I am uncertain about the surface over the large wound. Using silver alginate 04/11/18; ooCellulitis in the right foot is resolved ooLeft lateral calf wound which was his original wounds still has 2 tiny open areas remaining this is just about closed ooNonviable surface on the left heel is better but still requires debridement ooLeft first second toe/tinea pedis still open using silver alginate ooRight first second toe wound tinea pedis I asked him to go back to using ketoconazole and silver alginate ooLarge pressure ulcers on the left ischial tuberosity this shear injury here is resolved. Wound is smaller. No evidence of infection using silver alginate 04/18/18; ooPatient arrives with an intense area of cellulitis in the right mid lower calf extending into the right heel area. Bright red and warm. Smaller area on the left anterior leg. He has a significant history of MRSA. He will definitely need antibioticsoodoxycycline ooHe now has 2 open areas on the left ischial tuberosity the original large wound and now a satellite area which I think was above his initial satellite  areas. Not a wonderful surface on this satellite area surrounding erythema which looks like pressure related. ooHis left lateral calf wound again his original wound is just about closed ooLeft heel pressure injury still requiring debridement ooLeft first second toe looks a lot better using silver alginate ooRight first second toe also using silver alginate and ketoconazole cream also looks better 04/20/18; the patient was worked in early today out of concerns with his cellulitis on the right leg. I had started him on doxycycline. This was 2 days ago. His wife was concerned about the swelling in the area. Also concerned about the left buttock. He has not been systemically unwell no fever chills. No nausea vomiting or diarrhea 04/25/18; the patient's left buttock wound is continued to deteriorate he is using Hydrofera Blue. He is still completing clindamycin for the cellulitis on the right leg although all of this looks better. 05/02/18 ooLeft buttock wound still with a lot of drainage and a very tightly adherent fibrinous necrotic surface. He has a deeper area superiorly ooThe left lateral calf wound is still closed ooDTI wound on the left heel necrotic surface especially the circumference using Iodoflex ooAreas between his left first second toe and right first second toe both look better. Dorsally and the right first second toe he had a  necrotic surface although at smaller. In using silver alginate and ketoconazole. I did a culture last week which was a deep tissue culture of the reminiscence of the open wound on the right first second toe dorsally. This grew a few Acinetobacter and a few methicillin-resistant staph aureus. Nevertheless the area actually this week looked better. I didn't feel the need to specifically address this at least in terms of systemic antibiotics. 05/09/18; wounds are measuring larger more drainage per our intake. We are using Santyl covered with alginate on the  large superficial buttock wounds, Iodosorb on the left heel, ketoconazole and silver alginate to the dorsal first and second toes bilaterally. 05/16/18; ooThe area on his left buttock better in some aspects although the area superiorly over the ischial tuberosity required an extensive debridement.using Santyl ooLeft heel appears stable. Using Iodoflex ooThe areas between his first and second toes are not bad however there is spreading erythema up the dorsal aspect of his left foot this looks like cellulitis again. He is insensate the erythema is really very brilliant.o Erysipelas He went to see an allergist days ago because he was itching part of this he had lab work done. This showed a white count of 15.1 with 70% neutrophils. Hemoglobin of 11.4 and a platelet count of 659,000. Last white count we had in Epic was a 2-1/2 years ago which was 25.9 but he was ill at the time. He was able to show me some lab work that was done by his primary physician the pattern is about the same. 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 But prompted me to go ahead and do x-rays of both feet and the pelvis rule out osteomyelitis. He also had a comprehensive metabolic panel this was reasonably normal his albumin was 3.7 liver function tests BUN/creatinine all normal 05/23/18; x-rays of both his feet from last week were negative for underlying pulmonary abnormality. The x-ray of his pelvis however showed mild irregularity in the left ischial which may represent some early osteomyelitis. The wound in the left ischial continues to get deeper clearly now exposed muscle. Each week necrotic surface material over this area. Whereas the rest of the wounds do not look so bad. ooThe left ischial wound we have been using Santyl and calcium alginate ooTo the left heel surface necrotic debris using Iodoflex ooThe left lateral leg is still healed ooAreas on the left dorsal foot  and the right dorsal foot are about the same. There is some inflammation on the left which might represent contact dermatitis, fungal dermatitis I am doubtful cellulitis although this looks better than last week 05/30/18; CT scan done at Hospital did not show any osteomyelitis or abscess. Suggested the possibility of underlying cellulitis although I don't see a lot of evidence of this at the bedside ooThe wound itself on the left buttock/upper thigh actually looks somewhat better. No debridement ooLeft heel also looks better no debridement continue Iodoflex ooBoth dorsal first second toe spaces appear better using Lotrisone. Left still required debridement 06/06/18; ooIntake reported some purulent looking drainage from the left gluteal wound. Using Santyl and calcium alginate ooLeft heel looks better although still a nonviable surface requiring debridement ooThe left dorsal foot first/second webspace actually expanding and somewhat deeper. I may consider doing a shave biopsy of this area ooRight dorsal foot first/second webspace appears stable to improved. Using Lotrisone and silver alginate to both these areas 06/13/18 ooLeft gluteal surface looks better. Now separated  in the 2 wounds. No debridement required. Still drainage. We'll continue silver alginate ooLeft heel continues to look better with Iodoflex continue this for at least another week ooOf his dorsal foot wounds the area on the left still has some depth although it looks better than last week. We've been using Lotrisone and silver alginate 06/20/18 ooLeft gluteal continues to look better healthy tissue ooLeft heel continues to look better healthy granulation wound is smaller. He is using Iodoflex and his long as this continues continue the Iodoflex ooDorsal right foot looks better unfortunately dorsal left foot does not. There is swelling and erythema of his forefoot. He had minor trauma to this several days ago but  doesn't think this was enough to have caused any tissue injury. Foot looks like cellulitis, we have had this problem before 06/27/18 on evaluation today patient appears to be doing a little worse in regard to his foot ulcer. 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 Both of which would really be his only options for treating this infection. In the past he has been given and effusion of Orbactiv. This is done very well for him in the past again it's one time dosing IV antibiotic therapy. Subsequently I do believe this is something we're gonna need to see about doing at this point in time. Currently his other wounds seem to be doing somewhat better in my pinion I'm pretty happy in that regard. 07/03/18 on evaluation today patient's wounds actually appear to be doing fairly well. He has been tolerating the dressing changes without complication. All in all he seems to be showing signs of improvement. In regard to the antibiotics he has been dealing with infectious disease since I saw him last week as far as getting this scheduled. In the end [facility]'s [facility] this coming Friday. In the meantime he has been continuing to perform the dressing changes in such as previous. There does not appear to be any evidence of infection worsengin at this time. 07/10/18; ooSince I last saw this man 2 weeks ago things have actually improved. IV antibiotics of resulted in less forefoot erythema although there is still some present. He is not systemically unwell ooLeft buttock wounds o2 now have no depth there is increased epithelialization Using silver alginate ooLeft heel still requires debridement using Iodoflex ooLeft dorsal foot still with a sizable wound about the size of a border but healthy granulation ooRight  dorsal foot still with a slitlike area using silver alginate 07/18/18; the patient's cellulitis in the left foot is improved in fact I think it is on its way to resolving. ooLeft buttock wounds o2 both look better although the larger one has hypertension granulation we've been using silver alginate ooLeft heel has some thick circumferential redundant skin over the wound edge which will need to be removed today we've been using Iodoflex ooLeft dorsal foot is still a sizable wound required debridement using silver alginate ooThe right dorsal foot is just about closed only a small open area remains here 07/25/18; left foot cellulitis is resolved ooLeft buttock wounds o2 both look better. Hyper-granulation on the major area ooLeft heel as some debris over the surface but otherwise looks a healthier wound. Using silver collagen ooRight dorsal foot is just about closed 07/31/18; arrives with our intake nurse  worried about purulent drainage from the buttock. We had hyper-granulation here last week ooHis buttock wounds o2 continue to look better ooLeft heel some debris over the surface but measuring smaller. ooRight dorsal foot unfortunately has openings between the toes ooLeft foot superficial wound looks less aggravated. 08/07/18 ooButtock wounds continue to look better although some of her granulation and the larger medial wound. silver alginate ooLeft heel continues to look a lot better.silver collagen ooLeft foot superficial wound looks less stable. Requires debridement. He has a new wound superficial area on the foot on the lateral dorsal foot. ooRight foot looks better using silver alginate without Lotrisone 08/14/2018; patient was in the ER last week diagnosed with a UTI. He is now on Cefpodoxime and Macrodantin. ooButtock wounds continued to be smaller. Using silver alginate ooLeft heel continues to look better using silver collagen ooLeft foot superficial wound looks as  though it is improving ooRight dorsal foot area is just about healed. 08/21/2018; patient is completed his antibiotics for his UTI. ooHe has 2 open areas on the buttocks. There is still not closed although the surface looks satisfactory. Using silver alginate ooLeft heel continues to improve using silver collagen ooThe bilateral dorsal foot areas which are at the base of his first and second toes/possible tinea pedis are actually stable on the left but worse on the right. The area on the left required debridement of necrotic surface. After debridement I obtained a specimen for PCR culture. ooThe right dorsal foot which is been just about healed last week is now reopened 08/28/2018; culture done on the left dorsal foot showed coag negative staph both staph epidermidis and Lugdunensis. I think this is worthwhile initiating systemic treatment. I will use doxycycline given his long list of allergies. The area on the left heel slightly improved but still requiring debridement. ooThe large wound on the buttock is just about closed whereas the smaller one is larger. Using silver alginate in this area 09/04/2018; patient is completing his doxycycline for the left foot although this continues to be a very difficult wound area with very adherent necrotic debris. We are using silver alginate to all his wounds right foot left foot and the small wounds on his buttock, silver collagen on the left heel. 09/11/2018; once again this patient has intense erythema and swelling of the left forefoot. Lesser degrees of erythema in the right foot. He has a long list of allergies and intolerances. I will reinstitute doxycycline. 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 Using silver alginate ooLeft heel also looks better using silver collagen ooUnfortunately both the areas on his feet look worse. The area on the left first second webspace is now gone through to  the plantar part of his foot. The area on the left foot anteriorly is irritated with erythema and swelling in the forefoot. 09/25/2018 ooHis wound on the left plantar heel looks better. Using silver collagen ooThe area on the left buttock 2 small remnant areas. One is closed one is still open. Using silver alginate ooThe areas between both his first and second toes look worse. This in spite of long-standing antifungal therapy with ketoconazole and silver alginate which should have antifungal activity ooHe has small areas around his original wound on the left calf one is on the bottom of the original scar tissue and one superiorly both of these are small and superficial but again given wound history in this site this is worrisome 10/02/2018 ooLeft  plantar heel continues to gradually contract using silver collagen ooLeft buttock wound is unchanged using silver alginate ooThe areas on his dorsal feet between his first and second toes bilaterally look about the same. 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 Small open area here. We have been using silver alginate 10/09/2018; ooLeft plantar heel continues to look healthy and contract. No debridement is required ooLeft buttock slightly smaller with a tape injury wound just below which was new this week ooDorsal feet somewhat improved I have been using clindamycin ooLeft lateral looks lower extremity the actual open area looks worse although a lot of this is epithelialized. I am going to change to silver collagen today He has a lot more swelling in the right leg although this is not pitting not red and not particularly warm there is a lot of spasm in the right leg usually indicative of people with paralysis of some underlying discomfort. We have reviewed his  vascular status from 2017 he had a left greater saphenous vein ablation. I wonder about referring him back to vascular surgery if the area on the left leg continues to deteriorate. 10/16/2018 in today for follow-up and management of multiple lower extremity ulcers. His left Buttock wound is much lower smaller and almost closed completely. The wound to the left ankle has began to reopen with Epithelialization and some adherent slough. He has multiple new areas to the left foot and leg. The left dorsal foot without much improvement. Wound present between left great webspace and 2nd toe. Erythema and edema present right leg. Right LE ultrasound obtained on 10/10/18 was negative for DVT. 10/23/2018; ooLeft buttock is closed over. Still dry macerated skin but there is no open wound. I suspect this is chronic pressure/moisture ooLeft lateral calf is quite a bit worse than when I saw this last. There is clearly drainage here he has macerated skin into the left plantar heel. We will change the primary dressing to alginate ooLeft dorsal foot has some improvement in overall wound area. Still using clindamycin and silver alginate ooRight dorsal foot about the same as the left using clindamycin and silver alginate ooThe erythema in the right leg has resolved. He is DVT rule out was negative ooLeft heel pressure area required debridement although the wound is smaller and the surface is health 10/26/2018 ooThe patient came back in for his nurse check today predominantly because of the drainage coming out of the left lateral leg with a recent reopening of his original wound on the left lateral calf. He comes in today with a large amount of surrounding erythema around the wound extending from the calf into the ankle and even in the area on the dorsal foot. He is not systemically unwell. He is not febrile. Nevertheless this looks like cellulitis. We have been using silver alginate to the area. I changed him  to a regular visit and I am going to prescribe him doxycycline. The rationale here is a long list of medication intolerances and a history of MRSA. I did not see anything that I thought would provide a valuable culture 10/30/2018 ooFollow-up from his appointment 4 days ago with really an extensive area of cellulitis in the left calf left lateral ankle and left dorsal foot. I put him on doxycycline. He has a long list of medication allergies which are true allergy  reactions. 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 In any case he is a lot better today. The erythema especially in the anterior and lateral left calf is better. He still has left ankle erythema. He also is complaining about increasing edema in the right leg we have only been using Kerlix Coban and he has been doing the wraps at home. Finally he has a spotty rash on the medial part of his upper left calf which looks like folliculitis or perhaps wrap occlusion type injury. Small superficial macules not pustules 11/06/18 patient arrives today with again a considerable degree of erythema around the wound on the left lateral calf extending into the dorsal ankle and dorsal foot. This is a lot worse than when I saw this last week. He is on doxycycline really with not a lot of improvement. He has not been systemically unwell Wounds on the; left heel actually looks improved. Original area on the left foot and proximity to the first and second toes looks about the same. He has superficial areas on the dorsal foot, anterior calf and then the reopening of his original wound on the left lateral calf which looks about the same ooThe only area he has on the right is the dorsal webspace first and second which is smaller. ooHe has a large area of dry erythematous skin on the left buttock small open area here. 11/13/2018; the patient arrives in much better condition. The erythema  around the wound on the left lateral calf is a lot better. Not sure whether this was the clindamycin or the TCA and ketoconazole or just in the improvement in edema control [stasis dermatitis]. In any case this is a lot better. The area on the left heel is very small and just about resolved using silver collagen we have been using silver alginate to the areas on his dorsal feet 11/20/2018; his wounds include the left lateral calf, left heel, dorsal aspects of both feet just proximal to the first second webspace. He is stable to slightly improved. I did not think any changes to his dressings were going to be necessary 11/27/2018 he has a reopening on the left buttock which is surrounded by what looks like tinea or perhaps some other form of dermatitis. The area on the left dorsal foot has some erythema around it I have marked this area but I am not sure whether this is cellulitis or not. Left heel is not closed. Left calf the reopening is really slightly longer and probably worse 1/13; in general things look better and smaller except for the left dorsal foot. Area on the left heel is just about closed, left buttock looks better only a small wound remains in the skin looks better [using Lotrisone] 1/20; the area on the left heel only has a few remaining open areas here. Left lateral calf about the same in terms of size, left dorsal foot slightly larger right lateral foot still not closed. The area on the left buttock has no open wound and the surrounding skin looks a lot better 1/27; the area on the left heel is closed. Left lateral calf better but still requiring extensive debridements. The area on his left buttock is closed. He still has the open areas on the left dorsal foot which is slightly smaller in the right foot which is slightly expanded. We have been using Iodoflex on these areas as well 2/3; left heel is closed. Left lateral calf  still requiring debridement using Iodoflex there is no open  area on his left buttock however he has dry scaly skin over a large area of this. Not really responding well to the Lotrisone. Finally the areas on his dorsal feet at the level of the first second webspace are slightly smaller on the right and about the same on the left. Both of these vigorously debrided with Anasept and gauze 2/10; left heel remains closed he has dry erythematous skin over the left buttock but there is no open wound here. Left lateral leg has come in and with. Still requiring debridement we have been using Iodoflex here. Finally the area on the left dorsal foot and right dorsal foot are really about the same extremely dry callused fissured areas. He does not yet have a dermatology appointment 2/17; left heel remains closed. He has a new open area on the left buttock. The area on the left lateral calf is bigger longer and still covered in necrotic debris. No major change in his foot areas bilaterally. I am awaiting for a dermatologist to look on this. We have been using ketoconazole I do not know that this is been doing any good at all. 2/24; left heel remains closed. The left buttock wound that was new reopening last week looks better. The left lateral calf appears better also although still requires debridement. The major area on his foot is the left first second also requiring debridement. We have been putting Prisma on all wounds. I do not believe that the ketoconazole has done too much good for his feet. He will use Lotrisone I am going to give him a 2-week course of terbinafine. We still do not have a dermatology appointment 3/2 left heel remains closed however there is skin over bone in this area I pointed this out to him today. The left buttock wound is epithelialized but still does not look completely stable. The area on the left leg required debridement were using silver collagen here. With regards to his feet we changed to Lotrisone last week and silver alginate. 3/9;  left heel remains closed. Left buttock remains closed. The area on the right foot is essentially closed. The left foot remains unchanged. Slightly smaller on the left lateral calf. Using silver collagen to both of these areas 3/16-Left heel remains closed. Area on right foot is closed. Left lateral calf above the lateral malleolus open wound requiring debridement with easy bleeding. Left dorsal wound proximal to first toe also debrided. Left ischial area open new. Patient has been using Prisma with wrapping every 3 days. Dermatology appointment is apparently tomorrow.Patient has completed his terbinafine 2-week course with some apparent improvement according to him, there is still flaking and dry skin in his foot on the left 3/23; area on the right foot is reopened. The area on the left anterior foot is about the same still a very necrotic adherent surface. He still has the area on the left leg and reopening is on the left buttock. He apparently saw dermatology although I do not have a note. According to the patient who is usually fairly well informed they did not have any good ideas. Put him on oral terbinafine which she is been on before. 3/30; using silver collagen to all wounds. Apparently his dermatologist put him on doxycycline and rifampin presumably some culture grew staph. I do not have this result. He remains on terbinafine although I have used terbinafine on him before 4/6; patient has had a fairly substantial reopening  on the right foot between the first and second toes. He is finished his terbinafine and I believe is on doxycycline and rifampin still as prescribed by dermatology. We have been using silver collagen to all his wounds although the patient reports that he thinks silver alginate does better on the wounds on his buttock. 4/13; the area on his left lateral calf about the same size but it did not require debridement. ooLeft dorsal foot just proximal to the webspace between  the first and second toes is about the same. Still nonviable surface. I note some superficial bronze discoloration of the dorsal part of his foot ooRight dorsal foot just proximal to the first and second toes also looks about the same. I still think there may be the same discoloration I noted above on the left ooLeft buttock wound looks about the same 4/20; left lateral calf appears to be gradually contracting using silver collagen. 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 The left dorsal foot wound is debrided of tightly adherent necrotic debris and really cleans up quite nicely. The right area is worse with expansion. I did not debride this it is now over the base of the second toe ooThe area on his left buttock is smaller no debridement is required using silver collagen 5/4; left calf continues to make good progress. ooHe arrives with erythema around the wounds on his dorsal foot which even extends to the plantar aspect. Very concerning for coexistent infection. He is finished the erythromycin I gave him for possible erythrasma this does not seem to have helped. ooThe area on the left foot is about the same base of the dorsal toes ooIs area on the buttock looks improved on the left 5/11; left calf and left buttock continued to make good progress. Left foot is about the same to slightly improved. ooMajor problem is on the right foot. He has not had an x-ray. Deep tissue culture I did last week showed both Enterobacter and E. coli. I did not change the doxycycline I put him on empirically although neither 1 of these were plated to doxycycline. He arrives today with the erythema looking worse on both the dorsal and plantar foot. Macerated skin on the bottom of the foot. he has not been systemically unwell 5/18-Patient returns at 1 week, left calf wound appears to be making some progress, left buttock wound appears slightly  worse than last time, left foot wound looks slightly better, right foot redness is marginally better. X-ray of both feet show no air or evidence of osteomyelitis. Patient is finished his Omnicef and terbinafine. He continues to have macerated skin on the bottom of the left foot as well as right 5/26; left calf wound is better, left buttock wound appears to have multiple small superficial open areas with surrounding macerated skin. X-rays that I did last time showed no evidence of osteomyelitis in either foot. He is finished cefdinir and doxycycline. I do not think that he was on terbinafine. He continues to have a large superficial open area on the right foot anterior dorsal and slightly between the first and second toes. I did send him to dermatology 2 months ago or so wondering about whether they would do a fungal scraping. I do not believe they did but did do a culture. We have been using silver alginate to the toe areas, he has been using antifungals at home topically either ketoconazole or Lotrisone. We are using silver collagen on the left  foot, silver alginate on the right, silver collagen on the left lateral leg and silver alginate on the left buttock 6/1; left buttock area is healed. We have the left dorsal foot, left lateral leg and right dorsal foot. We are using silver alginate to the areas on both feet and silver collagen to the area on his left lateral calf 6/8; the left buttock apparently reopened late last week. He is not really sure how this happened. He is tolerating the terbinafine. Using silver alginate to all wounds 6/15; left buttock wound is larger than last week but still superficial. ooCame in the clinic today with a report of purulence from the left lateral leg I did not identify any infection ooBoth areas on his dorsal feet appear to be better. He is tolerating the terbinafine. Using silver alginate to all wounds 6/22; left buttock is about the same this week, left calf  quite a bit better. His left foot is about the same however he comes in with erythema and warmth in the right forefoot once again. Culture that I gave him in the beginning of May showed Enterobacter and E. coli. I gave him doxycycline and things seem to improve although neither 1 of these organisms was specifically plated. 6/29; left buttock is larger and dry this week. Left lateral calf looks to me to be improved. Left dorsal foot also somewhat improved right foot completely unchanged. The erythema on the right foot is still present. He is completing the Ceftin dinner that I gave him empirically [see discussion above.) 7/6 - All wounds look to be stable and perhaps improved, the left buttock wound is slightly smaller, per patient bleeds easily, completed ceftin, the right foot redness is less, he is on terbinafine 7/13; left buttock wound about the same perhaps slightly narrower. Area on the left lateral leg continues to narrow. Left dorsal foot slightly smaller right foot about the same. We are using silver alginate on the right foot and Hydrofera Blue to the areas on the left. Unna boot on the left 2 layer compression on the right 7/20; left buttock wound absolutely the same. Area on lateral leg continues to get better. Left dorsal foot require debridement as did the right no major change in the 7/27; left buttock wound the same size necrotic debris over the surface. The area on the lateral leg is closed once again. His left foot looks better right foot about the same although there is some involvement now of the posterior first second toe area. He is still on terbinafine which I have given him for a month, not certain a centimeter major change 06/25/19-All wounds appear to be slightly improved according to report, left buttock wound looks clean, both foot wounds have minimal to no debris the right dorsal foot has minimal slough. We are using Hydrofera Blue to the left and silver alginate to the  right foot and ischial wound. 8/10-Wounds all appear to be around the same, the right forefoot distal part has some redness which was not there before, however the wound looks clean and small. Ischial wound looks about the same with no changes 8/17; his wound on the left lateral calf which was his original chronic venous insufficiency wound remains closed. Since I last saw him the areas on the left dorsal foot right dorsal foot generally appear better but require debridement. The area on his left initial tuberosity appears somewhat larger to me perhaps hyper granulated and bleeds very easily. We have been using Hydrofera Blue to  the left dorsal foot and silver alginate to everything else 8/24; left lateral calf remains closed. The areas on his dorsal feet on the webspace of the first and second toes bilaterally both look better. The area on the left buttock which is the pressure ulcer stage II slightly smaller. I change the dressing to Hydrofera Blue to all areas 8/31; left lateral calf remains closed. The area on his dorsal feet bilaterally look better. Using Hydrofera Blue. Still requiring debridement on the left foot. No change in the left buttock pressure ulcers however 9/14; left lateral calf remains closed. Dorsal feet look quite a bit better than 2 weeks ago. Flaking dry skin also a lot better with the ammonium lactate I gave him 2 weeks ago. The area on the left buttock is improved. He states that his Roho cushion developed a leak and he is getting a new one, in the interim he is offloading this vigorously 9/21; left calf remains closed. Left heel which was a possible DTI looks better this week. He had macerated tissue around the left dorsal foot right foot looks satisfactory and improved left buttock wound. I changed his dressings to his feet to silver alginate bilaterally. Continuing Hydrofera Blue on the left buttock. 9/28 left calf remains closed. Left heel did not develop anything  [possible DTI] dry flaking skin on the left dorsal foot. Right foot looks satisfactory. Improved left buttock wound. We are using silver alginate on his feet Hydrofera Blue on the buttock. I have asked him to go back to the Lotrisone on his feet including the wounds and surrounding areas 10/5; left calf remains closed. The areas on the left and right feet about the same. A lot of this is epithelialized however debris over the remaining open areas. He is using Lotrisone and silver alginate. The area on the left buttock using Hydrofera Blue 10/26. Patient has been out for 3 weeks secondary to Covid concerns. He tested negative but I think his wife tested positive. He comes in today with the left foot substantially worse, right foot about the same. Even more concerning he states that the area on his left buttock closed over but then reopened and is considerably deeper in one aspect than it was before [stage III wound] 11/2; left foot really about the same as last week. Quarter sized wound on the dorsal foot just proximal to the first second toes. Surrounding erythema with areas of denuded epithelium. This is not really much different looking. Did not look like cellulitis this time however. ooRight foot area about the same.. We have been using silver alginate alginate on his toes ooLeft buttock still substantial irritated skin around the wound which I think looks somewhat better. We have been using Hydrofera Blue here. 11/9; left foot larger than last week and a very necrotic surface. Right foot I think is about the same perhaps slightly smaller. Debris around the circumference also addressed. Unfortunately on the left buttock there is been a decline. Satellite lesions below the major wound distally and now a an additional one posteriorly we have been using Hydrofera Blue but I think this is a pressure issue 11/16; left foot ulcer dorsally again a very adherent necrotic surface. Right foot is about  the same. Not much change in the pressure ulcer on his left buttock. Objective Constitutional Sitting or standing Blood Pressure is within target range for patient.. Pulse regular and within target range for patient.Marland Kitchen Respirations regular, non-labored and within target range.. Temperature is normal and within  the target range for the patient.Marland Kitchen Appears in no distress. Vitals Time Taken: 7:51 AM, Height: 70 in, Weight: 216 lbs, BMI: 31, Temperature: 98 F, Pulse: 115 bpm, Respiratory Rate: 16 breaths/min, Blood Pressure: 123/67 mmHg. Cardiovascular Pedal pulses palpable and strong bilaterally.. General Notes: Wound exam; left dorsal foot looks somewhat larger. Very adherent necrotic surface. Again an aggressive debridement hemostasis with silver nitrate and direct pressure. Right foot about the same dorsally. ooButtock wounds however the one small major wound and 3 inferior satellite areas. Integumentary (Hair, Skin) Wound #17 status is Open. Original cause of wound was Gradually Appeared. The wound is located on the Right,Dorsal Toe - Web between 1st and 2nd. The wound measures 4.5cm length x 2.7cm width x 0.1cm depth; 9.543cm^2 area and 0.954cm^3 volume. There is Fat Layer (Subcutaneous Tissue) Exposed exposed. There is no tunneling or undermining noted. There is a medium amount of serosanguineous drainage noted. The wound margin is distinct with the outline attached to the wound base. There is small (1-33%) pale granulation within the wound bed. There is a large (67-100%) amount of necrotic tissue within the wound bed including Adherent Slough. Wound #24 status is Open. Original cause of wound was Gradually Appeared. The wound is located on the Left,Dorsal Foot. The wound measures 2.5cm length x 4.2cm width x 0.1cm depth; 8.247cm^2 area and 0.825cm^3 volume. There is Fat Layer (Subcutaneous Tissue) Exposed exposed. There is no tunneling or undermining noted. There is a medium amount of  serosanguineous drainage noted. The wound margin is distinct with the outline attached to the wound base. There is small (1-33%) pale granulation within the wound bed. There is a large (67- 100%) amount of necrotic tissue within the wound bed including Adherent Slough. Wound #35 status is Open. Original cause of wound was Gradually Appeared. The wound is located on the Left Ischium. The wound measures 3.5cm length x 1.5cm width x 0.1cm depth; 4.123cm^2 area and 0.412cm^3 volume. There is Fat Layer (Subcutaneous Tissue) Exposed exposed. There is no tunneling or undermining noted. There is a medium amount of serosanguineous drainage noted. The wound margin is flat and intact. There is large (67-100%) red, friable granulation within the wound bed. There is a small (1-33%) amount of necrotic tissue within the wound bed including Adherent Slough. Wound #36 status is Open. Original cause of wound was Gradually Appeared. The wound is located on the Left,Distal Ischium. The wound measures 1cm length x 0.4cm width x 0.1cm depth; 0.314cm^2 area and 0.031cm^3 volume. There is Fat Layer (Subcutaneous Tissue) Exposed exposed. There is no tunneling or undermining noted. There is a medium amount of serosanguineous drainage noted. There is large (67-100%) red, pink granulation within the wound bed. 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 There was a Excisional Skin/Subcutaneous Tissue Debridement with a total area of 4 sq cm performed by Ricard Dillon., MD. With the following instrument(s): Curette to remove Viable and Non-Viable tissue/material.  Material removed includes Subcutaneous Tissue and Slough and. No specimens were taken. A time out was conducted at 08:25, prior to the start of the procedure. A Moderate amount of bleeding was controlled with Silver Nitrate. The procedure was tolerated well  with a pain level of 0 throughout and a pain level of 0 following the procedure. Post Debridement Measurements: 4.5cm length x 2.7cm width x 0.1cm depth; 0.954cm^3 volume. Character of Wound/Ulcer Post Debridement is improved. Post procedure Diagnosis Wound #17: Same as Pre-Procedure Wound #24 Pre-procedure diagnosis of Wound #24 is an Inflammatory located on the Left,Dorsal Foot . There was a Excisional Skin/Subcutaneous Tissue Debridement with a total area of 10.5 sq cm performed by Ricard Dillon., MD. With the following instrument(s): Curette to remove Viable and Non-Viable tissue/material. Material removed includes Subcutaneous Tissue and Slough and. No specimens were taken. A time out was conducted at 08:25, prior to the start of the procedure. A Moderate amount of bleeding was controlled with Silver Nitrate. The procedure was tolerated well with a pain level of 0 throughout and a pain level of 0 following the procedure. Post Debridement Measurements: 2.5cm length x 4.2cm width x 0.1cm depth; 0.825cm^3 volume. Character of Wound/Ulcer Post Debridement requires further debridement. Post procedure Diagnosis Wound #24: Same as Pre-Procedure Plan Follow-up Appointments: Return Appointment in 2 weeks. Dressing Change Frequency: Wound #17 Right,Dorsal Toe - Web between 1st and 2nd: Change Dressing every other day. Wound #36 Left,Distal Ischium: Change Dressing every other day. Wound #24 Left,Dorsal Foot: Change Dressing every other day. Wound #35 Left Ischium: Change Dressing every other day. Skin Barriers/Peri-Wound Care: Antifungal cream - on toes on both feet daily Moisturizing lotion - both legs Other: - Triamcinolone  cream Wound #35 Left Ischium: Barrier cream Wound Cleansing: Wound #17 Right,Dorsal Toe - Web between 1st and 2nd: Clean wound with Wound Cleanser Primary Wound Dressing: Wound #17 Right,Dorsal Toe - Web between 1st and 2nd: Iodoflex Wound #24 Left,Dorsal Foot: Iodoflex Wound #35 Left Ischium: Hydrofera Blue Wound #36 Left,Distal Ischium: Hydrofera Blue Secondary Dressing: Wound #17 Right,Dorsal Toe - Web between 1st and 2nd: Kerlix/Rolled Gauze Dry Gauze Wound #24 Left,Dorsal Foot: Kerlix/Rolled Gauze Dry Gauze Heel Cup - add heel cup to left heel for protection. Wound #35 Left Ischium: Foam Border Wound #36 Left,Distal Ischium: Foam Border Edema Control: Elevate legs to the level of the heart or above for 30 minutes daily and/or when sitting, a frequency of: - throughout the day Support Garment 30-40 mm/Hg pressure to: - Juxtalite to both legs. Off-Loading: Low air-loss mattress (Group 2) Roho cushion for wheelchair Turn and reposition every 2 hours - out of wheelchair throughout the day, try to lay on sides, sleep in the bed not the recliner 1. We are continuing with Iodoflex to both dorsal feet. My plan is to biopsy this area and do a PCR culture in 2 weeks. The only thought I have in addition to this is his severe venous hypertension. He has known prior ablations. His legs are constantly dependent without any muscle compression and I wonder if this is the major reason for the difficulty healing these areas. 2. Pressure ulcer on the buttock. I think this is all pressure. From a skin point of view the skin around this area looks a lot better i.e. less chronically irritated macerated skin Electronic Signature(s) Signed: 10/08/2019 5:46:17 PM By: Linton Ham MD Entered By: Linton Ham on 10/08/2019 09:02:18 -------------------------------------------------------------------------------- SuperBill Details Patient Name: Date of Service: Bahr, [name]  10/08/2019 Medical Record [id number] Patient Account Number: [id number] Date of Birth/Sex: Treating RN: 01-11-1986 (33 y.o. [name] Primary Care Provider: [name], [name] Other Clinician: Referring Provider: Treating Provider/Extender:, [name], [name]: 196 Diagnosis Coding ICD-10 Codes Code Description L97.511  Non-pressure chronic ulcer of other part of right 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 L97.521 Non-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, complete 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, stage 3 Facility Procedures CPT4 Code Description: [id number] - DEB SUBQ TISSUE 20 SQ CM/< ICD-10 Diagnosis Description L97.511 Non-pressure chronic ulcer of other part of right foot li L97.521 Non-pressure chronic ulcer of other part of left foot lim Modifier: mited to breakd ited to breakdo Quantity: 1 own of skin wn of skin Physician Procedures CPT4 Code Description: [id number] [id number] - WC PHYS SUBQ TISS 20 SQ CM ICD-10 Diagnosis Description L97.511 Non-pressure chronic ulcer of other part of right foot li L97.521 Non-pressure chronic ulcer of other part of left foot lim Modifier: mited to breakd ited to breakdo Quantity: 1 own of skin wn of skin Electronic Signature(s) Signed: 10/08/2019 5:46:17 PM By: Linton Ham MD Entered By: Linton Ham on 10/08/2019 09:02:43

## 2019-10-15 ENCOUNTER — Encounter (HOSPITAL_BASED_OUTPATIENT_CLINIC_OR_DEPARTMENT_OTHER): Payer: BC Managed Care – PPO | Admitting: Internal Medicine

## 2019-10-20 DIAGNOSIS — H6123 Impacted cerumen, bilateral: Secondary | ICD-10-CM | POA: Diagnosis not present

## 2019-10-22 ENCOUNTER — Other Ambulatory Visit: Payer: Self-pay

## 2019-10-22 ENCOUNTER — Encounter (HOSPITAL_BASED_OUTPATIENT_CLINIC_OR_DEPARTMENT_OTHER): Payer: BC Managed Care – PPO | Admitting: Internal Medicine

## 2019-10-22 DIAGNOSIS — L97522 Non-pressure chronic ulcer of other part of left foot with fat layer exposed: Secondary | ICD-10-CM | POA: Diagnosis not present

## 2019-10-22 DIAGNOSIS — L89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, stage 3: Secondary | ICD-10-CM | POA: Diagnosis not present

## 2019-10-22 DIAGNOSIS — G8221 Paraplegia, complete: Secondary | ICD-10-CM | POA: Diagnosis not present

## 2019-10-22 DIAGNOSIS — L97512 Non-pressure chronic ulcer of other part of right foot with fat layer exposed: Secondary | ICD-10-CM | POA: Diagnosis not present

## 2019-10-22 DIAGNOSIS — L97521 Non-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: Secondary | ICD-10-CM | POA: Diagnosis not present

## 2019-10-22 DIAGNOSIS — L89329 Pressure ulcer of left buttock, unspecified stage: Secondary | ICD-10-CM | POA: Diagnosis not present

## 2019-10-22 NOTE — Progress Notes (Signed)
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 refractory to multiple dressing attempts. He also has areas in his first and second toes bilaterally in the anterior and posterior web space. I had been using Iodoflex here after a prolonged course of silver alginate with ketoconazole was ineffective [question tinea pedis] 07/14/17; patient arrives today with a very difficult adherent material over his left lateral lower leg wound. He also has surrounding erythema and poorly controlled edema. He was switched his Santyl last visit which the nurses are applying once during his doctor visit and once on a nurse visit. He was also reduced to 2 layer compression I'm not exactly sure of the issue here. 07/21/17; better surface today after 1 week of Iodoflex. Significant cellulitis that we treated last week also better. [Doxycycline] 07/28/17 better surface today with now 2 weeks of Iodoflex. Significant cellulitis treated with doxycycline. He has now completed the doxycycline and he is back to his usual degree of chronic venous inflammation/stasis dermatitis. He reminds me he has  had ablations surgery here 08/04/17; continued improvement with Iodoflex to the left lateral leg wound in terms of the surface of the  wound although the dimensions are better. 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 Problematic areas on the plantar aspect of the first second toe web space on the left and the dorsal aspect of the first second toe web space on the right. At one point I felt these were probably related to chronic fungal infections in treated him aggressively for this although we have not made any improvement here. 08/11/17; left lateral leg. Surface continues to improve with the Iodoflex although we are not seeing much improvement in overall wound dimensions. Areas on his plantar left foot and right foot show no improvement. In fact the right foot looks somewhat worse 08/18/17; left lateral leg. We changed to [facility] Blue last week after a prolonged course of Iodoflex which helps get the surface better. It appears that the wound with is improved. Continue with difficult areas on the left dorsal first second and plantar first second on the right 09/01/17; patient arrives in clinic today having had a temperature of 103 yesterday. He was seen in the ER and [facility]. The patient was concerned he could have cellulitis again in the right leg however they diagnosed him with a UTI and he is now on Keflex. He has a history of cellulitis which is been recurrent and difficult but this is been in the left leg, in the past 5 use doxycycline. He does in and out catheterizations at home which are risk factors for UTI 09/08/17; patient will be completing his Keflex this weekend. The erythema on the left leg is considerably better. He has a new wound today on the medial part of the right leg small superficial almost looks like a skin tear. He has worsening of the area on the right dorsal first and second toe. His major area on the left lateral leg is better. Using Hydrofera Blue on all areas 09/15/17; gradual reduction in width on the long wound in the left  lateral leg. No debridement required. He also has wounds on the plantar aspect of his left first second toe web space and on the dorsal aspect of the right first second toe web space. 09/22/17; there continues to be very gradual improvements in the dimensions of the left lateral leg wound. He hasn't round erythematous spot with might be pressure on his wheelchair. There is no evidence obviously of infection no purulence no warmth He has a dry scaled area on the plantar aspect of the left first second toe Improved area on the dorsal right first second toe. 09/29/17; left lateral leg wound continues to improve in dimensions mostly with an is still a fairly long but increasingly narrow wound. He has a dry scaled area on the plantar aspect of his left first second toe web space Increasingly concerning area on the dorsal right first second toe. In fact I am concerned today about possible cellulitis around this wound. The areas extending up his second toe and although there is deformities here almost appears to abut on the nailbed. 10/06/17; left lateral leg wound continues to make very gradual progress. Tissue culture I did from the right first second toe dorsal foot last time grew MRSA and enterococcus which was vancomycin sensitive. This was not sensitive to clindamycin or doxycycline. He is allergic to Zyvox and sulfa we have therefore arrange for him to have  dalvance infusion tomorrow. He is had this in the past and tolerated it well 10/20/17; left lateral leg wound continues to make decent progress. This is certainly reduced in terms of with there is advancing epithelialization.The cellulitis in the right foot looks better although he still has a deep wound in the dorsal aspect of the first second toe web space. Plantar left first toe web space on the left I think is making some progress 10/27/17; left lateral leg wound continues to make decent progress. Advancing epithelialization.using Hydrofera  Blue The right first second toe web space wound is better-looking using silver alginate Improvement in the left plantar first second toe web space. Again using silver alginate 11/03/17 left lateral leg wound continues to make decent progress albeit slowly. Using [facility] The right per second toe web space continues to be a very problematic looking punched out wound. I obtained a piece of tissue for deep culture I did extensively treated this for fungus. It is difficult to imagine that this is a pressure area as the patient states other than going outside he doesn't really wear shoes at home The left plantar first second toe web space looked fairly senescent. Necrotic edges. This required debridement change to [facility] Blue to all wound areas 11/10/17; left lateral leg wound continues to contract. Using Hydrofera Blue On the right dorsal first second toe web space dorsally. Culture I did of this area last week grew MRSA there is not an easy oral option in this patient was multiple antibiotic allergies or intolerances. This was only a rare culture isolate I'm therefore going to use Bactroban under silver alginate On the left plantar first second toe web space. Debridement is required here. This is also unchanged 11/17/17; left lateral leg wound continues to contract using Hydrofera Blue this is no longer the major issue. The major concern here is the right first second toe web space. He now has an open area going from dorsally to the plantar aspect. There is now wound on the inner lateral part of the first toe. Not a very viable surface on this. There is erythema spreading medially into the forefoot. No major change in the left first second toe plantar wound 11/24/17; left lateral leg wound continues to contract using Hydrofera Blue. Nice improvement today The right first second toe web space all of this looks a lot less angry than last week. I have given him clindamycin and topical Bactroban  for MRSA and terbinafine for the possibility of underlining tinea pedis that I could not control with ketoconazole. Looks somewhat better The area on the plantar left first second toe web space is weeping with dried debris around the wound 12/01/17; left lateral leg wound continues to contract he Hydrofera Blue. It is becoming thinner in terms of with nevertheless it is making good improvement. The right first second toe web space looks less angry but still a large necrotic-looking wounds starting on the plantar aspect of the right foot extending between the toes and now extensively on the base of the right second toe. I gave him clindamycin and topical Bactroban for MRSA anterior benefiting for the possibility of underlying tinea pedis. Not looking better today The area on the left first/second toe looks better. Debrided of necrotic debris 12/05/17* the patient was worked in urgently today because over the weekend he found blood on his incontinence bad when he woke up. He was found to have an ulcer by his wife who does most of his wound care. He  came in today for [place] to look at this. He has not had a history of wounds in his buttocks in spite of his paraplegia. 12/08/17; seen in follow-up today at his usual appointment. He was seen earlier this week and found to have a new wound on his buttock. We also follow him for wounds on the left lateral leg, left first second toe web space and right first second toe web space 12/15/17; we have been using Hydrofera Blue to the left lateral leg which has improved. The right first second toe web space has also improved. Left first second toe web space plantar aspect looks stable. The left buttock has worsened using Santyl. Apparently the buttock has drainage 12/22/17; we have been using Hydrofera Blue to the left lateral leg which continues to improve now 2 small wounds separated by normal skin. He tells [place] he had a fever up to 100 yesterday he is prone to UTIs  but has not noted anything different. He does in and out catheterizations. The area between the first and second toes today does not look good necrotic surface covered with what looks to be purulent drainage and erythema extending into the third toe. I had gotten this to something that I thought look better last time however it is not look good today. He also has a necrotic surface over the buttock wound which is expanded. I thought there might be infection under here so I removed a lot of the surface with a #5 curet though nothing look like it really needed culturing. He is been using Santyl to this area 12/27/17; his original wound on the left lateral leg continues to improve using Hydrofera Blue. I gave him samples of Baxdella although he was unable to take them out of fear for an allergic reaction ["lump in his throat"].the culture I did of the purulent drainage from his second toe last week showed both enterococcus and a set Enterobacter I was also concerned about the erythema on the bottom of his foot although paradoxically although this looks somewhat better today. 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 We've been using silver alginate here. They came up today that he sleeps in a recliner, I'm not sure why but I asked him to stop this 01/03/18; his original wound we've been using Hydrofera Blue is now separated into 2 areas. Ulcer on his left buttock is better he is off the recliner and sleeping in bed Finally both wound areas between his first and second toes also looks some better 01/10/18; his original wound on the left lateral leg is now separated into 2 wounds we've been using Hydrofera Blue Ulcer on his left buttock has some drainage. There is a small probing site going into muscle layer superiorly.using silver alginate -He arrives today with a deep tissue injury on the left heel The wound  on the dorsal aspect of his first second toe on the left looks a lot betterusing silver alginate ketoconazole The area on the first second toe web space on the right also looks a lot bette 01/17/18; his original wound on the left lateral leg continues to progress using Hydrofera Blue Ulcer on his left buttock also is smaller surface healthier except for a small probing site going into the muscle layer superiorly. 2.4 cm of tunneling in this area DTI on his left heel we have only been offloading. Looks better than last week no threatened  open no evidence of infection the wound on the dorsal aspect of the first second toe on the left continues to look like it's regressing we have only been using silver alginate and terbinafine orally The area in the first second toe web space on the right also looks to be a lot better using silver alginate and terbinafine I think this was prompted by tinea pedis 01/31/18; the patient was hospitalized in [place] last week apparently for a complicated UTI. He was discharged on cefepime he does in and out catheterizations. In the hospital he was discovered M I don't mild elevation of AST and ALTs and the terbinafine was stopped.predictably the pressure ulcer on his buttock looks betterusing silver alginate. The area on the left lateral leg also is better using Hydrofera Blue. The area between the first and second toes on the left better. First and second toes on the right still substantial but better. Finally the DTI on the left heel has held together and looks like it's resolving 02/07/18-he is here in follow-up evaluation for multiple ulcerations. He has new injury to the lateral aspect of the last issue a pressure ulcer, he states this is from adhesive removal trauma. He states he has tried multiple adhesive products with no success. All other ulcers appear stable. The left heel DTI is resolving. We will continue with same treatment plan and follow-up next  week. 02/14/18; follow-up for multiple areas. He has a new area last week on the lateral aspect of his pressure ulcer more over the posterior trochanter. The original pressure ulcer looks quite stable has healthy granulation. 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 Almost fully epithelialized on the original superior area using Hydrofera Blue DTI on the left heel has peeled off this week to reveal a small superficial wound under denuded skin and subcutaneous tissue Both areas between the first and second toes look better including nothing open on the left 02/21/18; The patient's wounds on his left ischial tuberosity and posterior left greater trochanter actually looked better. He has a large area of irritation around the area which I think is contact dermatitis. I am doubtful that this is fungal His original wound on the left lateral calf continues to improve we have been using Hydrofera Blue There is no open area in the left first second toe web space although there is a lot of thick callus The DTI on the left heel required debridement today of necrotic surface eschar and subcutaneous tissue using silver alginate Finally the area on the right first second toe webspace continues to contract using silver alginate and ketoconazole 02/28/18 Left ischial tuberosity wounds look better using silver alginate. Original wound on the left calf only has one small open area left using Hydrofera Blue DTI on the left heel required debridement mostly removing skin from around this wound surface. Using silver alginate The areas on the right first/second toe web space using silver alginate and ketoconazole 03/08/18 on evaluation today patient appears to be doing decently well as best I can tell in regard to his wounds. This is the first time that I have seen him as he generally is followed by Dr. Dellia Nims. With  that being said none of his wounds appear to be infected he does have an area where there is some skin covering what appears to be a new wound on the left dorsal surface of his great toe. This is  right at the nail bed. With that being said I do believe that debrided away some of the excess skin can be of benefit in this regard. Otherwise he has been tolerating the dressing changes without complication. 03/14/18; patient arrives today with the multiplicity of wounds that we are following. He has not been systemically unwell Original wound on the left lateral calf now only has 2 small open areas we've been using Hydrofera Blue which should continue The deep tissue injury on the left heel requires debridement today. We've been using silver alginate The left first second toe and the right first second toe are both are reminiscence what I think was tinea pedis. Apparently some of the callus Surface between the toes was removed last week when it started draining. Purulent drainage coming from the wound on the ischial tuberosity on the left. 03/21/18-He is here in follow-up evaluation for multiple wounds. 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 He tolerated debridement. The only change to last week's recommendations is to discontinue antifungal cream between toes. He will follow-up next week 03/28/18; following up for multiple wounds;Concern this week is streaking redness and swelling in the right foot. He is going to need antibiotics for this. 03/31/18; follow-up for right foot cellulitis. Streaking redness and swelling in the right foot on 03/28/18. 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 I put him on clindamycin 300 mg every 6 and brought him in for a quick check. He has an open wound between his first and second toes on the right foot as a potential source. 04/04/18; Right foot cellulitis is resolving he  is completing clindamycin. This is truly good news Left lateral calf wound which is initial wound only has one small open area inferiorly this is close to healing out. He has compression stockings. We will use Hydrofera Blue right down to the epithelialization of this Nonviable surface on the left heel which was initially pressure with a DTI. We've been using Hydrofera Blue. I'm going to switch this back to silver alginate Left first second toe/tinea pedis this looks better using silver alginate Right first second toe tinea pedis using silver alginate Large pressure ulcers on theLeft ischial tuberosity. Small wound here Looks better. I am uncertain about the surface over the large wound. Using silver alginate 04/11/18; Cellulitis in the right foot is resolved Left lateral calf wound which was his original wounds still has 2 tiny open areas remaining this is just about closed Nonviable surface on the left heel is better but still requires debridement Left first second toe/tinea pedis still open using silver alginate Right first second toe wound tinea pedis I asked him to go back to using ketoconazole and silver alginate Large pressure ulcers on the left ischial tuberosity this shear injury here is resolved. Wound is smaller. No evidence of infection using silver alginate 04/18/18; Patient arrives with an intense area of cellulitis in the right mid lower calf extending into the right heel area. Bright red and warm. Smaller area on the left anterior leg. He has a significant history of MRSA. He will definitely need antibioticsdoxycycline He now has 2 open areas on the left ischial tuberosity the original large wound and now a satellite area which I think was above his initial satellite areas. Not a wonderful surface on this satellite area surrounding erythema which looks like pressure related. His left lateral calf wound again his original wound is just about closed Left  heel pressure injury  still requiring debridement Left first second toe looks a lot better using silver alginate Right first second toe also using silver alginate and ketoconazole cream also looks better 04/20/18; the patient was worked in early today out of concerns with his cellulitis on the right leg. I had started him on doxycycline. This was 2 days ago. His wife was concerned about the swelling in the area. Also concerned about the left buttock. He has not been systemically unwell no fever chills. No nausea vomiting or diarrhea 04/25/18; the patient's left buttock wound is continued to deteriorate he is using Hydrofera Blue. He is still completing clindamycin for the cellulitis on the right leg although all of this looks better. 05/02/18 Left buttock wound still with a lot of drainage and a very tightly adherent fibrinous necrotic surface. He has a deeper area superiorly The left lateral calf wound is still closed DTI wound on the left heel necrotic surface especially the circumference using Iodoflex Areas between his left first second toe and right first second toe both look better. Dorsally and the right first second toe he had a necrotic surface although at smaller. In using silver alginate and ketoconazole. I did a culture last week which was a deep tissue culture of the reminiscence of the open wound on the right first second toe dorsally. This grew a few Acinetobacter and a few methicillin-resistant staph aureus. Nevertheless the area actually this week looked better. I didn't feel the need to specifically address this at least in terms of systemic antibiotics. 05/09/18; wounds are measuring larger more drainage per our intake. We are using Santyl covered with alginate on the large superficial buttock wounds, Iodosorb on the left heel, ketoconazole and silver alginate to the dorsal first and second toes bilaterally. 05/16/18; The area on his left buttock better in some aspects although the area superiorly over  the ischial tuberosity required an extensive debridement.using Santyl Left heel appears stable. Using Iodoflex The areas between his first and second toes are not bad however there is spreading erythema up the dorsal aspect of his left foot this looks like cellulitis again. He is insensate the erythema is really very brilliant.o Erysipelas He went to see an allergist days ago because he was itching part of this he had lab work done. This showed a white count of 15.1 with 70% neutrophils. Hemoglobin of 11.4 and a platelet count of 659,000. Last white count we had in Epic was a 2-1/2 years ago which was 25.9 but he was ill at the time. He was able to show me some lab work that was done by his primary physician the pattern is about the same. 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 But prompted me to go ahead and do x-rays of both feet and the pelvis rule out osteomyelitis. He also had a comprehensive metabolic panel this was reasonably normal his albumin was 3.7 liver function tests BUN/creatinine all normal 05/23/18; x-rays of both his feet from last week were negative for underlying pulmonary abnormality. The x-ray of his pelvis however showed mild irregularity in the left ischial which may represent some early osteomyelitis. The wound in the left ischial continues to get deeper clearly now exposed muscle. Each week necrotic surface material over this area. Whereas the rest of the wounds do not look so bad. The left ischial wound we have been using Santyl and calcium alginate To the left heel surface necrotic debris  using Iodoflex The left lateral leg is still healed Areas on the left dorsal foot and the right dorsal foot are about the same. There is some inflammation on the left which might represent contact dermatitis, fungal dermatitis I am doubtful cellulitis although this looks better than last week 05/30/18; CT scan done at Hospital did not show  any osteomyelitis or abscess. Suggested the possibility of underlying cellulitis although I don't see a lot of evidence of this at the bedside The wound itself on the left buttock/upper thigh actually looks somewhat better. No debridement Left heel also looks better no debridement continue Iodoflex Both dorsal first second toe spaces appear better using Lotrisone. Left still required debridement 06/06/18; Intake reported some purulent looking drainage from the left gluteal wound. Using Santyl and calcium alginate Left heel looks better although still a nonviable surface requiring debridement The left dorsal foot first/second webspace actually expanding and somewhat deeper. I may consider doing a shave biopsy of this area Right dorsal foot first/second webspace appears stable to improved. Using Lotrisone and silver alginate to both these areas 06/13/18 Left gluteal surface looks better. Now separated in the 2 wounds. No debridement required. Still drainage. We'll continue silver alginate Left heel continues to look better with Iodoflex continue this for at least another week Of his dorsal foot wounds the area on the left still has some depth although it looks better than last week. We've been using Lotrisone and silver alginate 06/20/18 Left gluteal continues to look better healthy tissue Left heel continues to look better healthy granulation wound is smaller. He is using Iodoflex and his long as this continues continue the Iodoflex Dorsal right foot looks better unfortunately dorsal left foot does not. There is swelling and erythema of his forefoot. He had minor trauma to this several days ago but doesn't think this was enough to have caused any tissue injury. Foot looks like cellulitis, we have had this problem before 06/27/18 on evaluation today patient appears to be doing a little worse in regard to his foot ulcer. 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 Both of which would really be his only options for treating this infection. In the past he has been given and effusion of Orbactiv. This is done very well for him in the past again it's one time dosing IV antibiotic therapy. Subsequently I do believe this is something we're gonna need to see about doing at this point in time. Currently his other wounds seem to be doing somewhat better in my pinion I'm pretty happy in that regard. 07/03/18 on evaluation today patient's wounds actually appear to be doing fairly well. He has been tolerating the dressing changes without complication. All in all he seems to be showing signs of improvement. In regard to the antibiotics he has been dealing with infectious disease since I saw him last week as far as getting this scheduled. In the end [facility]'s [facility] this coming Friday. In the meantime he has been continuing to perform the dressing changes in such as previous. There does not appear to be any evidence of infection worsengin at this time. 07/10/18; Since I last saw this man 2 weeks ago things have actually improved. IV antibiotics of resulted in less forefoot erythema although there is still some present. He  is not systemically unwell Left buttock wounds 2 now have no depth there is increased epithelialization Using silver alginate Left heel still requires debridement using Iodoflex Left dorsal foot still with a sizable wound about the size of a border but healthy granulation Right dorsal foot still with a slitlike area using silver alginate 07/18/18; the patient's cellulitis in the left foot is improved in fact I think it is on its way to resolving. Left buttock wounds 2 both look better although the larger one has hypertension granulation we've been using silver alginate Left heel has some  thick circumferential redundant skin over the wound edge which will need to be removed today we've been using Iodoflex Left dorsal foot is still a sizable wound required debridement using silver alginate The right dorsal foot is just about closed only a small open area remains here 07/25/18; left foot cellulitis is resolved Left buttock wounds 2 both look better. Hyper-granulation on the major area Left heel as some debris over the surface but otherwise looks a healthier wound. Using silver collagen Right dorsal foot is just about closed 07/31/18; arrives with our intake nurse worried about purulent drainage from the buttock. We had hyper-granulation here last week His buttock wounds 2 continue to look better Left heel some debris over the surface but measuring smaller. Right dorsal foot unfortunately has openings between the toes Left foot superficial wound looks less aggravated. 08/07/18 Buttock wounds continue to look better although some of her granulation and the larger medial wound. silver alginate Left heel continues to look a lot better.silver collagen Left foot superficial wound looks less stable. Requires debridement. He has a new wound superficial area on the foot on the lateral dorsal foot. Right foot looks better using silver alginate without Lotrisone 08/14/2018; patient was in the ER last week diagnosed with a UTI. He is now on Cefpodoxime and Macrodantin. Buttock wounds continued to be smaller. Using silver alginate Left heel continues to look better using silver collagen Left foot superficial wound looks as though it is improving Right dorsal foot area is just about healed. 08/21/2018; patient is completed his antibiotics for his UTI. He has 2 open areas on the buttocks. There is still not closed although the surface looks satisfactory. Using silver alginate Left heel continues to improve using silver collagen The bilateral dorsal foot areas which are at the base of his first  and second toes/possible tinea pedis are actually stable on the left but worse on the right. The area on the left required debridement of necrotic surface. After debridement I obtained a specimen for PCR culture. The right dorsal foot which is been just about healed last week is now reopened 08/28/2018; culture done on the left dorsal foot showed coag negative staph both staph epidermidis and Lugdunensis. I think this is worthwhile initiating systemic treatment. I will use doxycycline given his long list of allergies. The area on the left heel slightly improved but still requiring debridement. The large wound on the buttock is just about closed whereas the smaller one is larger. Using silver alginate in this area 09/04/2018; patient is completing his doxycycline for the left foot although this continues to be a very difficult wound area with very adherent necrotic debris. We are using silver alginate to all his wounds right foot left foot and the small wounds on his buttock, silver collagen on the left heel. 09/11/2018; once again this patient has intense erythema and swelling of the left forefoot. Lesser degrees of erythema in the right  foot. He has a long list of allergies and intolerances. I will reinstitute doxycycline. 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 Using silver alginate Left heel also looks better using silver collagen Unfortunately both the areas on his feet look worse. The area on the left first second webspace is now gone through to the plantar part of his foot. The area on the left foot anteriorly is irritated with erythema and swelling in the forefoot. 09/25/2018 His wound on the left plantar heel looks better. Using silver collagen The area on the left buttock 2 small remnant areas. One is closed one is still open. Using silver alginate The areas between both his first and second toes look worse. This in spite of  long-standing antifungal therapy with ketoconazole and silver alginate which should have antifungal activity He has small areas around his original wound on the left calf one is on the bottom of the original scar tissue and one superiorly both of these are small and superficial but again given wound history in this site this is worrisome 10/02/2018 Left plantar heel continues to gradually contract using silver collagen Left buttock wound is unchanged using silver alginate The areas on his dorsal feet between his first and second toes bilaterally look about the same. I prescribed clindamycin ointment to see if we can address chronic staph colonization and also the underlying possibility of erythrasma The left lateral lower extremity wound is actually on the lateral part of his ankle. Small open area here. We have been using silver alginate 10/09/2018; Left plantar heel continues to look healthy and contract. No debridement is required Left buttock slightly smaller with a tape injury wound just below which was new this week Dorsal feet somewhat improved I have been using clindamycin Left lateral looks lower extremity the actual open area looks worse although a lot of this is epithelialized. I am going to change to silver collagen today He has a lot more swelling in the right leg although this is not pitting not red and not particularly warm there is a lot of spasm in the right leg usually indicative of people with paralysis of some underlying discomfort. We have reviewed his vascular status from 2017 he had a left greater saphenous vein ablation. I wonder about referring him back to vascular surgery if the area on the left leg continues to deteriorate. 10/16/2018 in today for follow-up and management of multiple lower extremity ulcers. His left Buttock wound is much lower smaller and almost closed completely. The wound to the left ankle has began to reopen with Epithelialization and some  adherent slough. He has multiple new areas to the left foot and leg. The left dorsal foot without much improvement. Wound present between left great webspace and 2nd toe. Erythema and edema present right leg. Right LE ultrasound obtained on 10/10/18 was negative for DVT. 10/23/2018; Left buttock is closed over. Still dry macerated skin but there is no open wound. I suspect this is chronic pressure/moisture Left lateral calf is quite a bit worse than when I saw this last. There is clearly drainage here he has macerated skin into the left plantar heel. We will change the primary dressing to alginate Left dorsal foot has some improvement in overall wound area. Still using clindamycin and silver alginate Right dorsal foot about the same as the left using clindamycin and silver alginate The erythema in the right leg has resolved. He is DVT rule out  was negative Left heel pressure area required debridement although the wound is smaller and the surface is health 10/26/2018 The patient came back in for his nurse check today predominantly because of the drainage coming out of the left lateral leg with a recent reopening of his original wound on the left lateral calf. He comes in today with a large amount of surrounding erythema around the wound extending from the calf into the ankle and even in the area on the dorsal foot. He is not systemically unwell. He is not febrile. Nevertheless this looks like cellulitis. We have been using silver alginate to the area. I changed him to a regular visit and I am going to prescribe him doxycycline. The rationale here is a long list of medication intolerances and a history of MRSA. I did not see anything that I thought would provide a valuable culture 10/30/2018 Follow-up from his appointment 4 days ago with really an extensive area of cellulitis in the left calf left lateral ankle and left dorsal foot. I put him on doxycycline. He has a long list of medication allergies  which are true allergy reactions. 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 In any case he is a lot better today. The erythema especially in the anterior and lateral left calf is better. He still has left ankle erythema. He also is complaining about increasing edema in the right leg we have only been using Kerlix Coban and he has been doing the wraps at home. Finally he has a spotty rash on the medial part of his upper left calf which looks like folliculitis or perhaps wrap occlusion type injury. Small superficial macules not pustules 11/06/18 patient arrives today with again a considerable degree of erythema around the wound on the left lateral calf extending into the dorsal ankle and dorsal foot. This is a lot worse than when I saw this last week. He is on doxycycline really with not a lot of improvement. He has not been systemically unwell Wounds on the; left heel actually looks improved. Original area on the left foot and proximity to the first and second toes looks about the same. He has superficial areas on the dorsal foot, anterior calf and then the reopening of his original wound on the left lateral calf which looks about the same The only area he has on the right is the dorsal webspace first and second which is smaller. He has a large area of dry erythematous skin on the left buttock small open area here. 11/13/2018; the patient arrives in much better condition. The erythema around the wound on the left lateral calf is a lot better. Not sure whether this was the clindamycin or the TCA and ketoconazole or just in the improvement in edema control [stasis dermatitis]. In any case this is a lot better. The area on the left heel is very small and just about resolved using silver collagen we have been using silver alginate to the areas on his dorsal feet 11/20/2018; his wounds include the left lateral calf, left heel, dorsal  aspects of both feet just proximal to the first second webspace. He is stable to slightly improved. I did not think any changes to his dressings were going to be necessary 11/27/2018 he has a reopening on the left buttock which is surrounded by what looks like tinea or perhaps some other form of dermatitis. The area on the left dorsal foot has some erythema  around it I have marked this area but I am not sure whether this is cellulitis or not. Left heel is not closed. Left calf the reopening is really slightly longer and probably worse 1/13; in general things look better and smaller except for the left dorsal foot. Area on the left heel is just about closed, left buttock looks better only a small wound remains in the skin looks better [using Lotrisone] 1/20; the area on the left heel only has a few remaining open areas here. Left lateral calf about the same in terms of size, left dorsal foot slightly larger right lateral foot still not closed. The area on the left buttock has no open wound and the surrounding skin looks a lot better 1/27; the area on the left heel is closed. Left lateral calf better but still requiring extensive debridements. The area on his left buttock is closed. He still has the open areas on the left dorsal foot which is slightly smaller in the right foot which is slightly expanded. We have been using Iodoflex on these areas as well 2/3; left heel is closed. Left lateral calf still requiring debridement using Iodoflex there is no open area on his left buttock however he has dry scaly skin over a large area of this. Not really responding well to the Lotrisone. Finally the areas on his dorsal feet at the level of the first second webspace are slightly smaller on the right and about the same on the left. Both of these vigorously debrided with Anasept and gauze 2/10; left heel remains closed he has dry erythematous skin over the left buttock but there is no open wound here. Left  lateral leg has come in and with. Still requiring debridement we have been using Iodoflex here. Finally the area on the left dorsal foot and right dorsal foot are really about the same extremely dry callused fissured areas. He does not yet have a dermatology appointment 2/17; left heel remains closed. He has a new open area on the left buttock. The area on the left lateral calf is bigger longer and still covered in necrotic debris. No major change in his foot areas bilaterally. I am awaiting for a dermatologist to look on this. We have been using ketoconazole I do not know that this is been doing any good at all. 2/24; left heel remains closed. The left buttock wound that was new reopening last week looks better. The left lateral calf appears better also although still requires debridement. The major area on his foot is the left first second also requiring debridement. We have been putting Prisma on all wounds. I do not believe that the ketoconazole has done too much good for his feet. He will use Lotrisone I am going to give him a 2-week course of terbinafine. We still do not have a dermatology appointment 3/2 left heel remains closed however there is skin over bone in this area I pointed this out to him today. The left buttock wound is epithelialized but still does not look completely stable. The area on the left leg required debridement were using silver collagen here. With regards to his feet we changed to Lotrisone last week and silver alginate. 3/9; left heel remains closed. Left buttock remains closed. The area on the right foot is essentially closed. The left foot remains unchanged. Slightly smaller on the left lateral calf. Using silver collagen to both of these areas 3/16-Left heel remains closed. Area on right foot is closed. Left lateral calf  above the lateral malleolus open wound requiring debridement with easy bleeding. Left dorsal wound proximal to first toe also debrided. Left  ischial area open new. Patient has been using Prisma with wrapping every 3 days. Dermatology appointment is apparently tomorrow.Patient has completed his terbinafine 2-week course with some apparent improvement according to him, there is still flaking and dry skin in his foot on the left 3/23; area on the right foot is reopened. The area on the left anterior foot is about the same still a very necrotic adherent surface. He still has the area on the left leg and reopening is on the left buttock. He apparently saw dermatology although I do not have a note. According to the patient who is usually fairly well informed they did not have any good ideas. Put him on oral terbinafine which she is been on before. 3/30; using silver collagen to all wounds. Apparently his dermatologist put him on doxycycline and rifampin presumably some culture grew staph. I do not have this result. He remains on terbinafine although I have used terbinafine on him before 4/6; patient has had a fairly substantial reopening on the right foot between the first and second toes. He is finished his terbinafine and I believe is on doxycycline and rifampin still as prescribed by dermatology. We have been using silver collagen to all his wounds although the patient reports that he thinks silver alginate does better on the wounds on his buttock. 4/13; the area on his left lateral calf about the same size but it did not require debridement. Left dorsal foot just proximal to the webspace between the first and second toes is about the same. Still nonviable surface. I note some superficial bronze discoloration of the dorsal part of his foot Right dorsal foot just proximal to the first and second toes also looks about the same. I still think there may be the same discoloration I noted above on the left Left buttock wound looks about the same 4/20; left lateral calf appears to be gradually contracting using silver collagen. He remains on  erythromycin empiric treatment for possible erythrasma involving his digital spaces. The left dorsal foot wound is debrided of tightly adherent necrotic debris and really cleans up quite nicely. The right area is worse with expansion. I did not debride this it is now over the base of the second toe The area on his left buttock is smaller no debridement is required using silver collagen 5/4; left calf continues to make good progress. He arrives with erythema around the wounds on his dorsal foot which even extends to the plantar aspect. Very concerning for coexistent infection. He is finished the erythromycin I gave him for possible erythrasma this does not seem to have helped. The area on the left foot is about the same base of the dorsal toes Is area on the buttock looks improved on the left 5/11; left calf and left buttock continued to make good progress. Left foot is about the same to slightly improved. Major problem is on the right foot. He has not had an x-ray. Deep tissue culture I did last week showed both Enterobacter and E. coli. I did not change the doxycycline I put him on empirically although neither 1 of these were plated to doxycycline. He arrives today with the erythema looking worse on both the dorsal and plantar foot. Macerated skin on the bottom of the foot. he has not been systemically unwell 5/18-Patient returns at 1 week, left calf wound appears to be  making some progress, left buttock wound appears slightly worse than last time, left foot wound looks slightly better, right foot redness is marginally better. X-ray of both feet show no air or evidence of osteomyelitis. Patient is finished his Omnicef and terbinafine. He continues to have macerated skin on the bottom of the left foot as well as right 5/26; left calf wound is better, left buttock wound appears to have multiple small superficial open areas with surrounding macerated skin. X-rays that I did last time showed no  evidence of osteomyelitis in either foot. He is finished cefdinir and doxycycline. I do not think that he was on terbinafine. He continues to have a large superficial open area on the right foot anterior dorsal and slightly between the first and second toes. I did send him to dermatology 2 months ago or so wondering about whether they would do a fungal scraping. I do not believe they did but did do a culture. We have been using silver alginate to the toe areas, he has been using antifungals at home topically either ketoconazole or Lotrisone. We are using silver collagen on the left foot, silver alginate on the right, silver collagen on the left lateral leg and silver alginate on the left buttock 6/1; left buttock area is healed. We have the left dorsal foot, left lateral leg and right dorsal foot. We are using silver alginate to the areas on both feet and silver collagen to the area on his left lateral calf 6/8; the left buttock apparently reopened late last week. He is not really sure how this happened. He is tolerating the terbinafine. Using silver alginate to all wounds 6/15; left buttock wound is larger than last week but still superficial. Came in the clinic today with a report of purulence from the left lateral leg I did not identify any infection Both areas on his dorsal feet appear to be better. He is tolerating the terbinafine. Using silver alginate to all wounds 6/22; left buttock is about the same this week, left calf quite a bit better. His left foot is about the same however he comes in with erythema and warmth in the right forefoot once again. Culture that I gave him in the beginning of May showed Enterobacter and E. coli. I gave him doxycycline and things seem to improve although neither 1 of these organisms was specifically plated. 6/29; left buttock is larger and dry this week. Left lateral calf looks to me to be improved. Left dorsal foot also somewhat improved right foot  completely unchanged. The erythema on the right foot is still present. He is completing the Ceftin dinner that I gave him empirically [see discussion above.) 7/6 - All wounds look to be stable and perhaps improved, the left buttock wound is slightly smaller, per patient bleeds easily, completed ceftin, the right foot redness is less, he is on terbinafine 7/13; left buttock wound about the same perhaps slightly narrower. Area on the left lateral leg continues to narrow. Left dorsal foot slightly smaller right foot about the same. We are using silver alginate on the right foot and Hydrofera Blue to the areas on the left. Unna boot on the left 2 layer compression on the right 7/20; left buttock wound absolutely the same. Area on lateral leg continues to get better. Left dorsal foot require debridement as did the right no major change in the 7/27; left buttock wound the same size necrotic debris over the surface. The area on the lateral leg is closed once  again. His left foot looks better right foot about the same although there is some involvement now of the posterior first second toe area. He is still on terbinafine which I have given him for a month, not certain a centimeter major change 06/25/19-All wounds appear to be slightly improved according to report, left buttock wound looks clean, both foot wounds have minimal to no debris the right dorsal foot has minimal slough. We are using Hydrofera Blue to the left and silver alginate to the right foot and ischial wound. 8/10-Wounds all appear to be around the same, the right forefoot distal part has some redness which was not there before, however the wound looks clean and small. Ischial wound looks about the same with no changes 8/17; his wound on the left lateral calf which was his original chronic venous insufficiency wound remains closed. Since I last saw him the areas on the left dorsal foot right dorsal foot generally appear better but  require debridement. The area on his left initial tuberosity appears somewhat larger to me perhaps hyper granulated and bleeds very easily. We have been using Hydrofera Blue to the left dorsal foot and silver alginate to everything else 8/24; left lateral calf remains closed. The areas on his dorsal feet on the webspace of the first and second toes bilaterally both look better. The area on the left buttock which is the pressure ulcer stage II slightly smaller. I change the dressing to Hydrofera Blue to all areas 8/31; left lateral calf remains closed. The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 his right leg. We took measurements for stockings. His left anterior lateral leg wound continues to have a healthy-looking surface. I'm not sure where we were with the Apligraf run through his insurance. We have been using Iodoflex. He has a thick eschar on the left first second toe interface, I suspect this may be fungal however there is no visible open 12/23/16; no open wound on his right leg. He has 2 small areas left of the linear wound that was remaining last week. We have been  using Prisma, I thought I have disclosed this week, we can only look forward to next week 01/03/17; the patient had concerning areas of erythema last week, already on doxycycline for UTI through his primary doctor. The erythema is absolutely no better there is warmth and swelling both medially from the left lateral leg wound and also the dorsal left foot. 01/06/17- Patient is here for follow-up evaluation of his left lateral leg ulcer and bilateral feet ulcers. He is on oral antibiotic therapy, tolerating that. Nursing staff and the patient states that the erythema is improved from Monday. 01/13/17; the predominant left lateral leg wound continues to be problematic. I had put Apligraf on him earlier this month once. However he subsequently developed what appeared to be an intense cellulitis around the left lateral leg wound. I gave him Dalvance I think on 2/12 perhaps 2/13 he continues on cefdinir. The erythema is still present but the warmth and swelling is improved. I am hopeful that the cellulitis part of this control. I wouldn't be surprised if there is an element of venous inflammation as well. 01/17/17. The erythema is present but better in the left leg. His left lateral leg wound still does not have a viable surface buttons certain parts of this long thin wound it appears like there has been improvement in dimensions. 01/20/17; the erythema still present but much better in the left leg. I'm thinking this is his usual degree of chronic venous inflammation. The wound on the left leg looks somewhat better. Is less surface slough 01/27/17; erythema is back to the chronic venous inflammation. The wound on the left leg is somewhat better. I am back to the point where I like to try an Apligraf once again 02/10/17; slight improvement in wound dimensions. Apligraf #2. He is completing his doxycycline 02/14/17; patient arrives today having completed doxycycline last Thursday. This was supposed to be a nurse  visit however once again he hasn't tense erythema from the medial part of his wound extending over the lower leg. Also erythema in his foot this is roughly in the same distribution as last time. He has baseline chronic venous inflammation however this is a lot worse than the baseline I have learned to accept the on him is baseline inflammation 02/24/17- patient is here for follow-up evaluation. He is tolerating compression therapy. His voicing no complaints or concerns he is here anticipating an Apligraf 03/03/17; he arrives today with an adherent necrotic surface. I don't think this is surface is going to be amenable for Apligraf's. The erythema around his wound and on the left dorsal foot has resolved he is off antibiotics 03/10/17; better-looking surface today. I don't think he can tolerate Apligraf's. He tells me he had a wound VAC after a skin graft years ago to this area and they had difficulty with a seal. The erythema continues to be stable around this some degree of chronic venous inflammation  but he also has recurrent cellulitis. We have been using Iodoflex 03/17/17; continued improvement in the surface and may be small changes in dimensions. Using Iodoflex which seems the only thing that will control his surface 03/24/17- He is here for follow up evaluation of his LLE lateral ulceration and ulcer to right dorsal foot/toe space. He is voicing no complaints or concerns, He is tolerating compression wrap. 03/31/17 arrives today with a much healthier looking wound on the left lower extremity. We have been using Iodoflex for a prolonged period of time which has for the first time prepared and adequate looking wound bed although we have not had much in the way of wound dimension improvement. He also has a small wound between the first and second toe on the right 04/07/17; arrives today with a healthy-looking wound bed and at least the top 50% of this wound appears to be now her. No debridement was  required I have changed him to [facility] last week after prolonged Iodoflex. He did not do well with Apligraf's. We've had a re-opening between the first and second toe on the right 04/14/17; arrives today with a healthier looking wound bed contractions and the top 50% of this wound and some on the lesser 50%. Wound bed appears healthy. The area between the first and second toe on the right still remains problematic 04/21/17; continued very gradual improvement. Using [facility] 04/28/17; continued very gradual improvement in the left lateral leg venous insufficiency wound. His periwound erythema is very mild. We have been using Hydrofera Blue. Wound is making progress especially in the superior 50% 05/05/17; he continues to have very gradual improvement in the left lateral venous insufficiency wound. Both in terms with an length rings are improving. I debrided this every 2 weeks with #5 curet and we have been using Hydrofera Blue and again making good progress With regards to the wounds between his right first and second toe which I thought might of been tinea pedis he is not making as much progress very dry scaly skin over the area. Also the area at the base of the left first and second toe in a similar condition 05/12/17; continued gradual improvement in the refractory left lateral venous insufficiency wound on the left. Dimension smaller. Surface still requiring debridement using Hydrofera Blue 05/19/17; continued gradual improvement in the refractory left lateral venous ulceration. Careful inspection of the wound bed underlying rumination suggested some degree of epithelialization over the surface no debridement indicated. Continue Hydrofera Blue difficult areas between his toes first and third on the left than first and second on the right. I'm going to change to silver alginate from silver collagen. Continue ketoconazole as I suspect underlying tinea pedis 05/26/17; left lateral leg  venous insufficiency wound. We've been using Hydrofera Blue. I believe that there is expanding epithelialization over the surface of the wound albeit not coming from the wound circumference. This is a bit of an odd situation in which the epithelialization seems to be coming from the surface of the wound rather than in the exact circumference. There is still small open areas mostly along the lateral margin of the wound. ooHe has unchanged areas between the left first and second and the right first second toes which I been treating for tenia pedis 06/02/17; left lateral leg venous insufficiency wound. We have been using Hydrofera Blue. Somewhat smaller from the wound circumference. The surface of the wound remains a bit on it almost epithelialized sedation in appearance. I use an open  curette today debridement in the surface of all of this especially the edges ooSmall open wounds remaining on the dorsal right first and second toe interspace and the plantar left first second toe and her face on the left 06/09/17; wound on the left lateral leg continues to be smaller but very gradual and very dry surface using Hydrofera Blue 06/16/17 requires weekly debridements now on the left lateral leg although this continues to contract. I changed to silver collagen last week because of dryness of the wound bed. Using Iodoflex to the areas on his first and second toes/web space bilaterally 06/24/17; patient with history of paraplegia also chronic venous insufficiency with lymphedema. Has a very difficult wound on the left lateral leg. This has been gradually reducing in terms of with but comes in with a very dry adherent surface. High switch to silver collagen a week or so ago with hydrogel to keep the area moist. This is been refractory to multiple dressing attempts. He also has areas in his first and second toes bilaterally in the anterior and posterior web space. I had been using Iodoflex here after a prolonged  course of silver alginate with ketoconazole was ineffective [question tinea pedis] 07/14/17; patient arrives today with a very difficult adherent material over his left lateral lower leg wound. He also has surrounding erythema and poorly controlled edema. He was switched his Santyl last visit which the nurses are applying once during his doctor visit and once on a nurse visit. He was also reduced to 2 layer compression I'm not exactly sure of the issue here. 07/21/17; better surface today after 1 week of Iodoflex. Significant cellulitis that we treated last week also better. [Doxycycline] 07/28/17 better surface today with now 2 weeks of Iodoflex. Significant cellulitis treated with doxycycline. He has now completed the doxycycline and he is back to his usual degree of chronic venous inflammation/stasis dermatitis. He reminds me he has had ablations surgery here 08/04/17; continued improvement with Iodoflex to the left lateral leg wound in terms of the surface of the wound although the dimensions are better. 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 Problematic areas on the plantar aspect of the first second toe web space on the left and the dorsal aspect of the first second toe web space on the right. At one point I felt these were probably related to chronic fungal infections in treated him aggressively for this although we have not made any improvement here. 08/11/17; left lateral leg. Surface continues to improve with the Iodoflex although we are not seeing much improvement in overall wound dimensions. Areas on his plantar left foot and right foot show no improvement. In fact the right foot looks somewhat worse 08/18/17; left lateral leg. We changed to [facility] Blue last week after a prolonged course of Iodoflex which helps get the surface better. It appears that the wound with is improved. Continue with difficult areas on the  left dorsal first second and plantar first second on the right 09/01/17; patient arrives in clinic today having had a temperature of 103 yesterday. He was seen in the ER and [facility]. The patient was concerned he could have cellulitis again in the right leg however they diagnosed him with a UTI and he is now on Keflex. He has a history of cellulitis which is been recurrent and difficult but this is been in the left leg, in the past 5 use doxycycline. He does in and out  catheterizations at home which are risk factors for UTI 09/08/17; patient will be completing his Keflex this weekend. The erythema on the left leg is considerably better. He has a new wound today on the medial part of the right leg small superficial almost looks like a skin tear. He has worsening of the area on the right dorsal first and second toe. His major area on the left lateral leg is better. Using Hydrofera Blue on all areas 09/15/17; gradual reduction in width on the long wound in the left lateral leg. No debridement required. He also has wounds on the plantar aspect of his left first second toe web space and on the dorsal aspect of the right first second toe web space. 09/22/17; there continues to be very gradual improvements in the dimensions of the left lateral leg wound. He hasn't round erythematous spot with might be pressure on his wheelchair. There is no evidence obviously of infection no purulence no warmth ooHe has a dry scaled area on the plantar aspect of the left first second toe ooImproved area on the dorsal right first second toe. 09/29/17; left lateral leg wound continues to improve in dimensions mostly with an is still a fairly long but increasingly narrow wound. ooHe has a dry scaled area on the plantar aspect of his left first second toe web space ooIncreasingly concerning area on the dorsal right first second toe. In fact I am concerned today about possible cellulitis around this wound. The areas  extending up his second toe and although there is deformities here almost appears to abut on the nailbed. 10/06/17; left lateral leg wound continues to make very gradual progress. Tissue culture I did from the right first second toe dorsal foot last time grew MRSA and enterococcus which was vancomycin sensitive. This was not sensitive to clindamycin or doxycycline. He is allergic to Zyvox and sulfa we have therefore arrange for him to have dalvance infusion tomorrow. He is had this in the past and tolerated it well 10/20/17; left lateral leg wound continues to make decent progress. This is certainly reduced in terms of with there is advancing epithelialization.ooThe cellulitis in the right foot looks better although he still has a deep wound in the dorsal aspect of the first second toe web space. Plantar left first toe web space on the left I think is making some progress 10/27/17; left lateral leg wound continues to make decent progress. Advancing epithelialization.using Hydrofera Blue ooThe right first second toe web space wound is better-looking using silver alginate ooImprovement in the left plantar first second toe web space. Again using silver alginate 11/03/17 left lateral leg wound continues to make decent progress albeit slowly. Using Hydrofera Blue ooThe right per second toe web space continues to be a very problematic looking punched out wound. I obtained a piece of tissue for deep culture I did extensively treated this for fungus. It is difficult to imagine that this is a pressure area as the patient states other than going outside he doesn't really wear shoes at home ooThe left plantar first second toe web space looked fairly senescent. Necrotic edges. This required debridement oochange to Hydrofera Blue to all wound areas 11/10/17; left lateral leg wound continues to contract. Using Hydrofera Blue ooOn the right dorsal first second toe web space dorsally. Culture I did of this  area last week grew MRSA there is not an easy oral option in this patient was multiple antibiotic allergies or intolerances. This was only a rare culture isolate I'm  therefore going to use Bactroban under silver alginate ooOn the left plantar first second toe web space. Debridement is required here. This is also unchanged 11/17/17; left lateral leg wound continues to contract using Hydrofera Blue this is no longer the major issue. ooThe major concern here is the right first second toe web space. He now has an open area going from dorsally to the plantar aspect. There is now wound on the inner lateral part of the first toe. Not a very viable surface on this. There is erythema spreading medially into the forefoot. ooNo major change in the left first second toe plantar wound 11/24/17; left lateral leg wound continues to contract using Hydrofera Blue. Nice improvement today ooThe right first second toe web space all of this looks a lot less angry than last week. I have given him clindamycin and topical Bactroban for MRSA and terbinafine for the possibility of underlining tinea pedis that I could not control with ketoconazole. Looks somewhat better ooThe area on the plantar left first second toe web space is weeping with dried debris around the wound 12/01/17; left lateral leg wound continues to contract he Hydrofera Blue. It is becoming thinner in terms of with nevertheless it is making good improvement. ooThe right first second toe web space looks less angry but still a large necrotic-looking wounds starting on the plantar aspect of the right foot extending between the toes and now extensively on the base of the right second toe. I gave him clindamycin and topical Bactroban for MRSA anterior benefiting for the possibility of underlying tinea pedis. Not looking better today ooThe area on the left first/second toe looks better. Debrided of necrotic debris 12/05/17* the patient was worked in  urgently today because over the weekend he found blood on his incontinence bad when he woke up. He was found to have an ulcer by his wife who does most of his wound care. He came in today for [place] to look at this. He has not had a history of wounds in his buttocks in spite of his paraplegia. 12/08/17; seen in follow-up today at his usual appointment. He was seen earlier this week and found to have a new wound on his buttock. We also follow him for wounds on the left lateral leg, left first second toe web space and right first second toe web space 12/15/17; we have been using Hydrofera Blue to the left lateral leg which has improved. The right first second toe web space has also improved. Left first second toe web space plantar aspect looks stable. The left buttock has worsened using Santyl. Apparently the buttock has drainage 12/22/17; we have been using Hydrofera Blue to the left lateral leg which continues to improve now 2 small wounds separated by normal skin. He tells [place] he had a fever up to 100 yesterday he is prone to UTIs but has not noted anything different. He does in and out catheterizations. The area between the first and second toes today does not look good necrotic surface covered with what looks to be purulent drainage and erythema extending into the third toe. I had gotten this to something that I thought look better last time however it is not look good today. He also has a necrotic surface over the buttock wound which is expanded. I thought there might be infection under here so I removed a lot of the surface with a #5 curet though nothing look like it really needed culturing. He is been using Santyl to this  area 12/27/17; his original wound on the left lateral leg continues to improve using Hydrofera Blue. I gave him samples of Baxdella although he was unable to take them out of fear for an allergic reaction ["lump in his throat"].the culture I did of the purulent drainage from his  second toe last week showed both enterococcus and a set Enterobacter I was also concerned about the erythema on the bottom of his foot although paradoxically although this looks somewhat better today. 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 We've been using silver alginate here. They came up today that he sleeps in a recliner, I'm not sure why but I asked him to stop this 01/03/18; his original wound we've been using Hydrofera Blue is now separated into 2 areas. ooUlcer on his left buttock is better he is off the recliner and sleeping in bed ooFinally both wound areas between his first and second toes also looks some better 01/10/18; his original wound on the left lateral leg is now separated into 2 wounds we've been using Hydrofera Blue ooUlcer on his left buttock has some drainage. There is a small probing site going into muscle layer superiorly.using silver alginate -He arrives today with a deep tissue injury on the left heel ooThe wound on the dorsal aspect of his first second toe on the left looks a lot betterusing silver alginate ketoconazole ooThe area on the first second toe web space on the right also looks a lot bette 01/17/18; his original wound on the left lateral leg continues to progress using Hydrofera Blue ooUlcer on his left buttock also is smaller surface healthier except for a small probing site going into the muscle layer superiorly. 2.4 cm of tunneling in this area ooDTI on his left heel we have only been offloading. Looks better than last week no threatened open no evidence of infection oothe wound on the dorsal aspect of the first second toe on the left continues to look like it's regressing we have only been using silver alginate and terbinafine orally ooThe area in the first second toe web space on the right also looks to be a lot better using silver alginate  and terbinafine I think this was prompted by tinea pedis 01/31/18; the patient was hospitalized in [place] last week apparently for a complicated UTI. He was discharged on cefepime he does in and out catheterizations. In the hospital he was discovered M I don't mild elevation of AST and ALTs and the terbinafine was stopped.predictably the pressure ulcer on his buttock looks betterusing silver alginate. The area on the left lateral leg also is better using Hydrofera Blue. The area between the first and second toes on the left better. First and second toes on the right still substantial but better. Finally the DTI on the left heel has held together and looks like it's resolving 02/07/18-he is here in follow-up evaluation for multiple ulcerations. He has new injury to the lateral aspect of the last issue a pressure ulcer, he states this is from adhesive removal trauma. He states he has tried multiple adhesive products with no success. All other ulcers appear stable. The left heel DTI is resolving. We will continue with same treatment plan and follow-up next week. 02/14/18; follow-up for multiple areas. ooHe has a new area last week on the lateral aspect of his pressure ulcer more over the posterior trochanter. The original pressure ulcer looks quite stable has  healthy granulation. 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 Almost fully epithelialized on the original superior area using Hydrofera Blue ooDTI on the left heel has peeled off this week to reveal a small superficial wound under denuded skin and subcutaneous tissue ooBoth areas between the first and second toes look better including nothing open on the left 02/21/18; ooThe patient's wounds on his left ischial tuberosity and posterior left greater trochanter actually looked better. He has a large area of irritation around the area  which I think is contact dermatitis. I am doubtful that this is fungal ooHis original wound on the left lateral calf continues to improve we have been using Hydrofera Blue ooThere is no open area in the left first second toe web space although there is a lot of thick callus ooThe DTI on the left heel required debridement today of necrotic surface eschar and subcutaneous tissue using silver alginate ooFinally the area on the right first second toe webspace continues to contract using silver alginate and ketoconazole 02/28/18 ooLeft ischial tuberosity wounds look better using silver alginate. ooOriginal wound on the left calf only has one small open area left using Hydrofera Blue ooDTI on the left heel required debridement mostly removing skin from around this wound surface. Using silver alginate ooThe areas on the right first/second toe web space using silver alginate and ketoconazole 03/08/18 on evaluation today patient appears to be doing decently well as best I can tell in regard to his wounds. This is the first time that I have seen him as he generally is followed by Dr. Dellia Nims. With that being said none of his wounds appear to be infected he does have an area where there is some skin covering what appears to be a new wound on the left dorsal surface of his great toe. This is right at the nail bed. With that being said I do believe that debrided away some of the excess skin can be of benefit in this regard. Otherwise he has been tolerating the dressing changes without complication. 03/14/18; patient arrives today with the multiplicity of wounds that we are following. He has not been systemically unwell ooOriginal wound on the left lateral calf now only has 2 small open areas we've been using Hydrofera Blue which should continue ooThe deep tissue injury on the left heel requires debridement today. We've been using silver alginate ooThe left first second toe and the right first second  toe are both are reminiscence what I think was tinea pedis. Apparently some of the callus Surface between the toes was removed last week when it started draining. ooPurulent drainage coming from the wound on the ischial tuberosity on the left. 03/21/18-He is here in follow-up evaluation for multiple wounds. 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 He tolerated debridement. The only change to last week's recommendations is to discontinue antifungal cream between toes. He will follow-up next week 03/28/18; following up for multiple wounds;Concern this week is streaking redness and swelling in the right foot. He is going to need antibiotics for this. 03/31/18; follow-up for right foot cellulitis. Streaking redness and swelling in the right foot on 03/28/18. 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 I put him on clindamycin 300 mg every 6 and brought him in for a quick check. He has an open wound between his first and second toes  on the right foot as a potential source. 04/04/18; ooRight foot cellulitis is resolving he is completing clindamycin. This is truly good news ooLeft lateral calf wound which is initial wound only has one small open area inferiorly this is close to healing out. He has compression stockings. We will use Hydrofera Blue right down to the epithelialization of this ooNonviable surface on the left heel which was initially pressure with a DTI. We've been using Hydrofera Blue. I'm going to switch this back to silver alginate ooLeft first second toe/tinea pedis this looks better using silver alginate ooRight first second toe tinea pedis using silver alginate ooLarge pressure ulcers on theLeft ischial tuberosity. Small wound here Looks better. I am uncertain about the surface over the large wound. Using silver alginate 04/11/18; ooCellulitis in the right foot is resolved ooLeft  lateral calf wound which was his original wounds still has 2 tiny open areas remaining this is just about closed ooNonviable surface on the left heel is better but still requires debridement ooLeft first second toe/tinea pedis still open using silver alginate ooRight first second toe wound tinea pedis I asked him to go back to using ketoconazole and silver alginate ooLarge pressure ulcers on the left ischial tuberosity this shear injury here is resolved. Wound is smaller. No evidence of infection using silver alginate 04/18/18; ooPatient arrives with an intense area of cellulitis in the right mid lower calf extending into the right heel area. Bright red and warm. Smaller area on the left anterior leg. He has a significant history of MRSA. He will definitely need antibioticsoodoxycycline ooHe now has 2 open areas on the left ischial tuberosity the original large wound and now a satellite area which I think was above his initial satellite areas. Not a wonderful surface on this satellite area surrounding erythema which looks like pressure related. ooHis left lateral calf wound again his original wound is just about closed ooLeft heel pressure injury still requiring debridement ooLeft first second toe looks a lot better using silver alginate ooRight first second toe also using silver alginate and ketoconazole cream also looks better 04/20/18; the patient was worked in early today out of concerns with his cellulitis on the right leg. I had started him on doxycycline. This was 2 days ago. His wife was concerned about the swelling in the area. Also concerned about the left buttock. He has not been systemically unwell no fever chills. No nausea vomiting or diarrhea 04/25/18; the patient's left buttock wound is continued to deteriorate he is using Hydrofera Blue. He is still completing clindamycin for the cellulitis on the right leg although all of this looks better. 05/02/18 ooLeft buttock  wound still with a lot of drainage and a very tightly adherent fibrinous necrotic surface. He has a deeper area superiorly ooThe left lateral calf wound is still closed ooDTI wound on the left heel necrotic surface especially the circumference using Iodoflex ooAreas between his left first second toe and right first second toe both look better. Dorsally and the right first second toe he had a necrotic surface although at smaller. In using silver alginate and ketoconazole. I did a culture last week which was a deep tissue culture of the reminiscence of the open wound on the right first second toe dorsally. This grew a few Acinetobacter and a few methicillin-resistant staph aureus. Nevertheless the area actually this week looked better. I didn't feel the need to specifically address this at least in terms of systemic antibiotics. 05/09/18; wounds are measuring larger  more drainage per our intake. We are using Santyl covered with alginate on the large superficial buttock wounds, Iodosorb on the left heel, ketoconazole and silver alginate to the dorsal first and second toes bilaterally. 05/16/18; ooThe area on his left buttock better in some aspects although the area superiorly over the ischial tuberosity required an extensive debridement.using Santyl ooLeft heel appears stable. Using Iodoflex ooThe areas between his first and second toes are not bad however there is spreading erythema up the dorsal aspect of his left foot this looks like cellulitis again. He is insensate the erythema is really very brilliant.o Erysipelas He went to see an allergist days ago because he was itching part of this he had lab work done. This showed a white count of 15.1 with 70% neutrophils. Hemoglobin of 11.4 and a platelet count of 659,000. Last white count we had in Epic was a 2-1/2 years ago which was 25.9 but he was ill at the time. He was able to show me some lab work that was done by his primary physician the  pattern is about the same. 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 But prompted me to go ahead and do x-rays of both feet and the pelvis rule out osteomyelitis. He also had a comprehensive metabolic panel this was reasonably normal his albumin was 3.7 liver function tests BUN/creatinine all normal 05/23/18; x-rays of both his feet from last week were negative for underlying pulmonary abnormality. The x-ray of his pelvis however showed mild irregularity in the left ischial which may represent some early osteomyelitis. The wound in the left ischial continues to get deeper clearly now exposed muscle. Each week necrotic surface material over this area. Whereas the rest of the wounds do not look so bad. ooThe left ischial wound we have been using Santyl and calcium alginate ooTo the left heel surface necrotic debris using Iodoflex ooThe left lateral leg is still healed ooAreas on the left dorsal foot and the right dorsal foot are about the same. There is some inflammation on the left which might represent contact dermatitis, fungal dermatitis I am doubtful cellulitis although this looks better than last week 05/30/18; CT scan done at Hospital did not show any osteomyelitis or abscess. Suggested the possibility of underlying cellulitis although I don't see a lot of evidence of this at the bedside ooThe wound itself on the left buttock/upper thigh actually looks somewhat better. No debridement ooLeft heel also looks better no debridement continue Iodoflex ooBoth dorsal first second toe spaces appear better using Lotrisone. Left still required debridement 06/06/18; ooIntake reported some purulent looking drainage from the left gluteal wound. Using Santyl and calcium alginate ooLeft heel looks better although still a nonviable surface requiring debridement ooThe left dorsal foot first/second webspace actually expanding and somewhat deeper. I may  consider doing a shave biopsy of this area ooRight dorsal foot first/second webspace appears stable to improved. Using Lotrisone and silver alginate to both these areas 06/13/18 ooLeft gluteal surface looks better. Now separated in the 2 wounds. No debridement required. Still drainage. We'll continue silver alginate ooLeft heel continues to look better with Iodoflex continue this for at least another week ooOf his dorsal foot wounds the area on the left still has some depth although it looks better than last week. We've been using Lotrisone and silver alginate 06/20/18 ooLeft gluteal continues to look better healthy tissue ooLeft heel continues to look better healthy granulation wound is smaller. He  is using Iodoflex and his long as this continues continue the Iodoflex ooDorsal right foot looks better unfortunately dorsal left foot does not. There is swelling and erythema of his forefoot. He had minor trauma to this several days ago but doesn't think this was enough to have caused any tissue injury. Foot looks like cellulitis, we have had this problem before 06/27/18 on evaluation today patient appears to be doing a little worse in regard to his foot ulcer. 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 Both of which would really be his only options for treating this infection. In the past he has been given and effusion of Orbactiv. This is done very well for him in the past again it's one time dosing IV antibiotic therapy. Subsequently I do believe this is something we're gonna need to see about doing at this point in time. Currently his other wounds seem to be doing somewhat better in my pinion I'm pretty happy in that regard. 07/03/18 on evaluation today patient's wounds actually appear to be doing fairly well. He has been tolerating the dressing  changes without complication. All in all he seems to be showing signs of improvement. In regard to the antibiotics he has been dealing with infectious disease since I saw him last week as far as getting this scheduled. In the end [facility]'s [facility] this coming Friday. In the meantime he has been continuing to perform the dressing changes in such as previous. There does not appear to be any evidence of infection worsengin at this time. 07/10/18; ooSince I last saw this man 2 weeks ago things have actually improved. IV antibiotics of resulted in less forefoot erythema although there is still some present. He is not systemically unwell ooLeft buttock wounds o2 now have no depth there is increased epithelialization Using silver alginate ooLeft heel still requires debridement using Iodoflex ooLeft dorsal foot still with a sizable wound about the size of a border but healthy granulation ooRight dorsal foot still with a slitlike area using silver alginate 07/18/18; the patient's cellulitis in the left foot is improved in fact I think it is on its way to resolving. ooLeft buttock wounds o2 both look better although the larger one has hypertension granulation we've been using silver alginate ooLeft heel has some thick circumferential redundant skin over the wound edge which will need to be removed today we've been using Iodoflex ooLeft dorsal foot is still a sizable wound required debridement using silver alginate ooThe right dorsal foot is just about closed only a small open area remains here 07/25/18; left foot cellulitis is resolved ooLeft buttock wounds o2 both look better. Hyper-granulation on the major area ooLeft heel as some debris over the surface but otherwise looks a healthier wound. Using silver collagen ooRight dorsal foot is just about closed 07/31/18; arrives with our intake nurse worried about purulent drainage from the  buttock. We had hyper-granulation here last week ooHis buttock wounds o2 continue to look better ooLeft heel some debris over the surface but measuring smaller. ooRight dorsal foot unfortunately has openings between the toes ooLeft foot superficial wound looks less aggravated. 08/07/18 ooButtock wounds continue to look better although some of her granulation and the larger medial wound. silver alginate ooLeft heel continues to look a lot better.silver collagen ooLeft foot  superficial wound looks less stable. Requires debridement. He has a new wound superficial area on the foot on the lateral dorsal foot. ooRight foot looks better using silver alginate without Lotrisone 08/14/2018; patient was in the ER last week diagnosed with a UTI. He is now on Cefpodoxime and Macrodantin. ooButtock wounds continued to be smaller. Using silver alginate ooLeft heel continues to look better using silver collagen ooLeft foot superficial wound looks as though it is improving ooRight dorsal foot area is just about healed. 08/21/2018; patient is completed his antibiotics for his UTI. ooHe has 2 open areas on the buttocks. There is still not closed although the surface looks satisfactory. Using silver alginate ooLeft heel continues to improve using silver collagen ooThe bilateral dorsal foot areas which are at the base of his first and second toes/possible tinea pedis are actually stable on the left but worse on the right. The area on the left required debridement of necrotic surface. After debridement I obtained a specimen for PCR culture. ooThe right dorsal foot which is been just about healed last week is now reopened 08/28/2018; culture done on the left dorsal foot showed coag negative staph both staph epidermidis and Lugdunensis. I think this is worthwhile initiating systemic treatment. I will use doxycycline given his long list of allergies. The area on the left heel slightly improved but  still requiring debridement. ooThe large wound on the buttock is just about closed whereas the smaller one is larger. Using silver alginate in this area 09/04/2018; patient is completing his doxycycline for the left foot although this continues to be a very difficult wound area with very adherent necrotic debris. We are using silver alginate to all his wounds right foot left foot and the small wounds on his buttock, silver collagen on the left heel. 09/11/2018; once again this patient has intense erythema and swelling of the left forefoot. Lesser degrees of erythema in the right foot. He has a long list of allergies and intolerances. I will reinstitute doxycycline. 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 Using silver alginate ooLeft heel also looks better using silver collagen ooUnfortunately both the areas on his feet look worse. The area on the left first second webspace is now gone through to the plantar part of his foot. The area on the left foot anteriorly is irritated with erythema and swelling in the forefoot. 09/25/2018 ooHis wound on the left plantar heel looks better. Using silver collagen ooThe area on the left buttock 2 small remnant areas. One is closed one is still open. Using silver alginate ooThe areas between both his first and second toes look worse. This in spite of long-standing antifungal therapy with ketoconazole and silver alginate which should have antifungal activity ooHe has small areas around his original wound on the left calf one is on the bottom of the original scar tissue and one superiorly both of these are small and superficial but again given wound history in this site this is worrisome 10/02/2018 ooLeft plantar heel continues to gradually contract using silver collagen ooLeft buttock wound is unchanged using silver alginate ooThe areas on his dorsal feet between his first and second toes  bilaterally look about the same. 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 Small open area here. We have been using  silver alginate 10/09/2018; ooLeft plantar heel continues to look healthy and contract. No debridement is required ooLeft buttock slightly smaller with a tape injury wound just below which was new this week ooDorsal feet somewhat improved I have been using clindamycin ooLeft lateral looks lower extremity the actual open area looks worse although a lot of this is epithelialized. I am going to change to silver collagen today He has a lot more swelling in the right leg although this is not pitting not red and not particularly warm there is a lot of spasm in the right leg usually indicative of people with paralysis of some underlying discomfort. We have reviewed his vascular status from 2017 he had a left greater saphenous vein ablation. I wonder about referring him back to vascular surgery if the area on the left leg continues to deteriorate. 10/16/2018 in today for follow-up and management of multiple lower extremity ulcers. His left Buttock wound is much lower smaller and almost closed completely. The wound to the left ankle has began to reopen with Epithelialization and some adherent slough. He has multiple new areas to the left foot and leg. The left dorsal foot without much improvement. Wound present between left great webspace and 2nd toe. Erythema and edema present right leg. Right LE ultrasound obtained on 10/10/18 was negative for DVT. 10/23/2018; ooLeft buttock is closed over. Still dry macerated skin but there is no open wound. I suspect this is chronic pressure/moisture ooLeft lateral calf is quite a bit worse than when I saw this last. There is clearly drainage here he has macerated  skin into the left plantar heel. We will change the primary dressing to alginate ooLeft dorsal foot has some improvement in overall wound area. Still using clindamycin and silver alginate ooRight dorsal foot about the same as the left using clindamycin and silver alginate ooThe erythema in the right leg has resolved. He is DVT rule out was negative ooLeft heel pressure area required debridement although the wound is smaller and the surface is health 10/26/2018 ooThe patient came back in for his nurse check today predominantly because of the drainage coming out of the left lateral leg with a recent reopening of his original wound on the left lateral calf. He comes in today with a large amount of surrounding erythema around the wound extending from the calf into the ankle and even in the area on the dorsal foot. He is not systemically unwell. He is not febrile. Nevertheless this looks like cellulitis. We have been using silver alginate to the area. I changed him to a regular visit and I am going to prescribe him doxycycline. The rationale here is a long list of medication intolerances and a history of MRSA. I did not see anything that I thought would provide a valuable culture 10/30/2018 ooFollow-up from his appointment 4 days ago with really an extensive area of cellulitis in the left calf left lateral ankle and left dorsal foot. I put him on doxycycline. He has a long list of medication allergies which are true allergy reactions. 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 In any case he is a lot better today. The erythema especially in the anterior and lateral left calf is better. He still has left ankle erythema. He also is complaining about increasing edema in the right leg we have only been using Kerlix Coban and he has been doing the wraps at home. Finally he has a spotty  rash on the medial part of his upper left calf  which looks like folliculitis or perhaps wrap occlusion type injury. Small superficial macules not pustules 11/06/18 patient arrives today with again a considerable degree of erythema around the wound on the left lateral calf extending into the dorsal ankle and dorsal foot. This is a lot worse than when I saw this last week. He is on doxycycline really with not a lot of improvement. He has not been systemically unwell Wounds on the; left heel actually looks improved. Original area on the left foot and proximity to the first and second toes looks about the same. He has superficial areas on the dorsal foot, anterior calf and then the reopening of his original wound on the left lateral calf which looks about the same ooThe only area he has on the right is the dorsal webspace first and second which is smaller. ooHe has a large area of dry erythematous skin on the left buttock small open area here. 11/13/2018; the patient arrives in much better condition. The erythema around the wound on the left lateral calf is a lot better. Not sure whether this was the clindamycin or the TCA and ketoconazole or just in the improvement in edema control [stasis dermatitis]. In any case this is a lot better. The area on the left heel is very small and just about resolved using silver collagen we have been using silver alginate to the areas on his dorsal feet 11/20/2018; his wounds include the left lateral calf, left heel, dorsal aspects of both feet just proximal to the first second webspace. He is stable to slightly improved. I did not think any changes to his dressings were going to be necessary 11/27/2018 he has a reopening on the left buttock which is surrounded by what looks like tinea or perhaps some other form of dermatitis. The area on the left dorsal foot has some erythema around it I have marked this area but I am not sure whether this is cellulitis or not. Left heel is not closed. Left calf the reopening is  really slightly longer and probably worse 1/13; in general things look better and smaller except for the left dorsal foot. Area on the left heel is just about closed, left buttock looks better only a small wound remains in the skin looks better [using Lotrisone] 1/20; the area on the left heel only has a few remaining open areas here. Left lateral calf about the same in terms of size, left dorsal foot slightly larger right lateral foot still not closed. The area on the left buttock has no open wound and the surrounding skin looks a lot better 1/27; the area on the left heel is closed. Left lateral calf better but still requiring extensive debridements. The area on his left buttock is closed. He still has the open areas on the left dorsal foot which is slightly smaller in the right foot which is slightly expanded. We have been using Iodoflex on these areas as well 2/3; left heel is closed. Left lateral calf still requiring debridement using Iodoflex there is no open area on his left buttock however he has dry scaly skin over a large area of this. Not really responding well to the Lotrisone. Finally the areas on his dorsal feet at the level of the first second webspace are slightly smaller on the right and about the same on the left. Both of these vigorously debrided with Anasept and gauze 2/10; left heel remains closed he has  dry erythematous skin over the left buttock but there is no open wound here. Left lateral leg has come in and with. Still requiring debridement we have been using Iodoflex here. Finally the area on the left dorsal foot and right dorsal foot are really about the same extremely dry callused fissured areas. He does not yet have a dermatology appointment 2/17; left heel remains closed. He has a new open area on the left buttock. The area on the left lateral calf is bigger longer and still covered in necrotic debris. No major change in his foot areas bilaterally. I am awaiting for  a dermatologist to look on this. We have been using ketoconazole I do not know that this is been doing any good at all. 2/24; left heel remains closed. The left buttock wound that was new reopening last week looks better. The left lateral calf appears better also although still requires debridement. The major area on his foot is the left first second also requiring debridement. We have been putting Prisma on all wounds. I do not believe that the ketoconazole has done too much good for his feet. He will use Lotrisone I am going to give him a 2-week course of terbinafine. We still do not have a dermatology appointment 3/2 left heel remains closed however there is skin over bone in this area I pointed this out to him today. The left buttock wound is epithelialized but still does not look completely stable. The area on the left leg required debridement were using silver collagen here. With regards to his feet we changed to Lotrisone last week and silver alginate. 3/9; left heel remains closed. Left buttock remains closed. The area on the right foot is essentially closed. The left foot remains unchanged. Slightly smaller on the left lateral calf. Using silver collagen to both of these areas 3/16-Left heel remains closed. Area on right foot is closed. Left lateral calf above the lateral malleolus open wound requiring debridement with easy bleeding. Left dorsal wound proximal to first toe also debrided. Left ischial area open new. Patient has been using Prisma with wrapping every 3 days. Dermatology appointment is apparently tomorrow.Patient has completed his terbinafine 2-week course with some apparent improvement according to him, there is still flaking and dry skin in his foot on the left 3/23; area on the right foot is reopened. The area on the left anterior foot is about the same still a very necrotic adherent surface. He still has the area on the left leg and reopening is on the left buttock. He  apparently saw dermatology although I do not have a note. According to the patient who is usually fairly well informed they did not have any good ideas. Put him on oral terbinafine which she is been on before. 3/30; using silver collagen to all wounds. Apparently his dermatologist put him on doxycycline and rifampin presumably some culture grew staph. I do not have this result. He remains on terbinafine although I have used terbinafine on him before 4/6; patient has had a fairly substantial reopening on the right foot between the first and second toes. He is finished his terbinafine and I believe is on doxycycline and rifampin still as prescribed by dermatology. We have been using silver collagen to all his wounds although the patient reports that he thinks silver alginate does better on the wounds on his buttock. 4/13; the area on his left lateral calf about the same size but it did not require debridement. ooLeft dorsal foot just  proximal to the webspace between the first and second toes is about the same. Still nonviable surface. I note some superficial bronze discoloration of the dorsal part of his foot ooRight dorsal foot just proximal to the first and second toes also looks about the same. I still think there may be the same discoloration I noted above on the left ooLeft buttock wound looks about the same 4/20; left lateral calf appears to be gradually contracting using silver collagen. 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 The left dorsal foot wound is debrided of tightly adherent necrotic debris and really cleans up quite nicely. The right area is worse with expansion. I did not debride this it is now over the base of the second toe ooThe area on his left buttock is smaller no debridement is required using silver collagen 5/4; left calf continues to make good progress. ooHe arrives with erythema around the wounds on his  dorsal foot which even extends to the plantar aspect. Very concerning for coexistent infection. He is finished the erythromycin I gave him for possible erythrasma this does not seem to have helped. ooThe area on the left foot is about the same base of the dorsal toes ooIs area on the buttock looks improved on the left 5/11; left calf and left buttock continued to make good progress. Left foot is about the same to slightly improved. ooMajor problem is on the right foot. He has not had an x-ray. Deep tissue culture I did last week showed both Enterobacter and E. coli. I did not change the doxycycline I put him on empirically although neither 1 of these were plated to doxycycline. He arrives today with the erythema looking worse on both the dorsal and plantar foot. Macerated skin on the bottom of the foot. he has not been systemically unwell 5/18-Patient returns at 1 week, left calf wound appears to be making some progress, left buttock wound appears slightly worse than last time, left foot wound looks slightly better, right foot redness is marginally better. X-ray of both feet show no air or evidence of osteomyelitis. Patient is finished his Omnicef and terbinafine. He continues to have macerated skin on the bottom of the left foot as well as right 5/26; left calf wound is better, left buttock wound appears to have multiple small superficial open areas with surrounding macerated skin. X-rays that I did last time showed no evidence of osteomyelitis in either foot. He is finished cefdinir and doxycycline. I do not think that he was on terbinafine. He continues to have a large superficial open area on the right foot anterior dorsal and slightly between the first and second toes. I did send him to dermatology 2 months ago or so wondering about whether they would do a fungal scraping. I do not believe they did but did do a culture. We have been using silver alginate to the toe areas, he has been using  antifungals at home topically either ketoconazole or Lotrisone. We are using silver collagen on the left foot, silver alginate on the right, silver collagen on the left lateral leg and silver alginate on the left buttock 6/1; left buttock area is healed. We have the left dorsal foot, left lateral leg and right dorsal foot. We are using silver alginate to the areas on both feet and silver collagen to the area on his left lateral calf 6/8; the left buttock apparently reopened late last week. He is not really sure how this  happened. He is tolerating the terbinafine. Using silver alginate to all wounds 6/15; left buttock wound is larger than last week but still superficial. ooCame in the clinic today with a report of purulence from the left lateral leg I did not identify any infection ooBoth areas on his dorsal feet appear to be better. He is tolerating the terbinafine. Using silver alginate to all wounds 6/22; left buttock is about the same this week, left calf quite a bit better. His left foot is about the same however he comes in with erythema and warmth in the right forefoot once again. Culture that I gave him in the beginning of May showed Enterobacter and E. coli. I gave him doxycycline and things seem to improve although neither 1 of these organisms was specifically plated. 6/29; left buttock is larger and dry this week. Left lateral calf looks to me to be improved. Left dorsal foot also somewhat improved right foot completely unchanged. The erythema on the right foot is still present. He is completing the Ceftin dinner that I gave him empirically [see discussion above.) 7/6 - All wounds look to be stable and perhaps improved, the left buttock wound is slightly smaller, per patient bleeds easily, completed ceftin, the right foot redness is less, he is on terbinafine 7/13; left buttock wound about the same perhaps slightly narrower. Area on the left lateral leg continues to narrow. Left  dorsal foot slightly smaller right foot about the same. We are using silver alginate on the right foot and Hydrofera Blue to the areas on the left. Unna boot on the left 2 layer compression on the right 7/20; left buttock wound absolutely the same. Area on lateral leg continues to get better. Left dorsal foot require debridement as did the right no major change in the 7/27; left buttock wound the same size necrotic debris over the surface. The area on the lateral leg is closed once again. His left foot looks better right foot about the same although there is some involvement now of the posterior first second toe area. He is still on terbinafine which I have given him for a month, not certain a centimeter major change 06/25/19-All wounds appear to be slightly improved according to report, left buttock wound looks clean, both foot wounds have minimal to no debris the right dorsal foot has minimal slough. We are using Hydrofera Blue to the left and silver alginate to the right foot and ischial wound. 8/10-Wounds all appear to be around the same, the right forefoot distal part has some redness which was not there before, however the wound looks clean and small. Ischial wound looks about the same with no changes 8/17; his wound on the left lateral calf which was his original chronic venous insufficiency wound remains closed. Since I last saw him the areas on the left dorsal foot right dorsal foot generally appear better but require debridement. The area on his left initial tuberosity appears somewhat larger to me perhaps hyper granulated and bleeds very easily. We have been using Hydrofera Blue to the left dorsal foot and silver alginate to everything else 8/24; left lateral calf remains closed. The areas on his dorsal feet on the webspace of the first and second toes bilaterally both look better. The area on the left buttock which is the pressure ulcer stage II slightly smaller. I change the dressing to  Hydrofera Blue to all areas 8/31; left lateral calf remains closed. The area on his dorsal feet bilaterally look better. Using [facility]  Blue. Still requiring debridement on the left foot. No change in the left buttock pressure ulcers however 9/14; left lateral calf remains closed. Dorsal feet look quite a bit better than 2 weeks ago. Flaking dry skin also a lot better with the ammonium lactate I gave him 2 weeks ago. The area on the left buttock is improved. He states that his Roho cushion developed a leak and he is getting a new one, in the interim he is offloading this vigorously 9/21; left calf remains closed. Left heel which was a possible DTI looks better this week. He had macerated tissue around the left dorsal foot right foot looks satisfactory and improved left buttock wound. I changed his dressings to his feet to silver alginate bilaterally. Continuing Hydrofera Blue on the left buttock. 9/28 left calf remains closed. Left heel did not develop anything [possible DTI] dry flaking skin on the left dorsal foot. Right foot looks satisfactory. Improved left buttock wound. We are using silver alginate on his feet Hydrofera Blue on the buttock. I have asked him to go back to the Lotrisone on his feet including the wounds and surrounding areas 10/5; left calf remains closed. The areas on the left and right feet about the same. A lot of this is epithelialized however debris over the remaining open areas. He is using Lotrisone and silver alginate. The area on the left buttock using Hydrofera Blue 10/26. Patient has been out for 3 weeks secondary to Covid concerns. He tested negative but I think his wife tested positive. He comes in today with the left foot substantially worse, right foot about the same. Even more concerning he states that the area on his left buttock closed over but then reopened and is considerably deeper in one aspect than it was before [stage III wound] 11/2; left foot really  about the same as last week. Quarter sized wound on the dorsal foot just proximal to the first second toes. Surrounding erythema with areas of denuded epithelium. This is not really much different looking. Did not look like cellulitis this time however. ooRight foot area about the same.. We have been using silver alginate alginate on his toes ooLeft buttock still substantial irritated skin around the wound which I think looks somewhat better. We have been using Hydrofera Blue here. 11/9; left foot larger than last week and a very necrotic surface. Right foot I think is about the same perhaps slightly smaller. Debris around the circumference also addressed. Unfortunately on the left buttock there is been a decline. Satellite lesions below the major wound distally and now a an additional one posteriorly we have been using Hydrofera Blue but I think this is a pressure issue 11/16; left foot ulcer dorsally again a very adherent necrotic surface. Right foot is about the same. Not much change in the pressure ulcer on his left buttock. 11/30; left foot ulcer dorsally basically the same as when I saw him 2 weeks ago. Very adherent fibrinous debris on the wound surface. Patient reports a lot of drainage as well. 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 We have been using Iodoflex, patient changed back to alginate because of the drainage. Area on his right dorsal foot really looks benign with a healthier surface certainly a lot better than on the left. Left buttock wounds all improved using Hydrofera Blue Objective Constitutional Sitting or standing Blood Pressure is within target range for patient.. Pulse regular and within target range for patient.Marland Kitchen Respirations  regular, non-labored and within target range.. Temperature is normal and within the target range for the patient.Marland Kitchen Appears in no distress. Vitals Time Taken: 7:49 AM, Height: 70 in, Weight:  216 lbs, BMI: 31, Temperature: 99 F, Pulse: 123 bpm, Respiratory Rate: 20 breaths/min, Blood Pressure: 141/69 mmHg. General Notes: Wound exam; left dorsal foot about the same. Certainly a larger wound than it was at 1 point. Surface is turned very fibrinous adherent and difficult to debride. This is also a change. Aggressive debridement with a #5 curette well in the subcutaneous tissue. After this was done specimen obtained for PCR culture i.e. deep tissue culture ooArea on the right dorsal foot looks better certainly better than the left side. ooAreas on his left buttocks also improved Integumentary (Hair, Skin) Wound #17 status is Open. Original cause of wound was Gradually Appeared. The wound is located on the Right,Dorsal Toe - Web between 1st and 2nd. The wound measures 4.5cm length x 3cm width x 0.1cm depth; 10.603cm^2 area and 1.06cm^3 volume. There is Fat Layer (Subcutaneous Tissue) Exposed exposed. There is no tunneling or undermining noted. There is a medium amount of serosanguineous drainage noted. The wound margin is distinct with the outline attached to the wound base. There is medium (34-66%) pale granulation within the wound bed. There is a medium (34-66%) amount of necrotic tissue within the wound bed including Adherent Slough. Wound #24 status is Open. Original cause of wound was Gradually Appeared. The wound is located on the Left,Dorsal Foot. The wound measures 2.9cm length x 2.5cm width x 0.2cm depth; 5.694cm^2 area and 1.139cm^3 volume. There is Fat Layer (Subcutaneous Tissue) Exposed exposed. There is no tunneling or undermining noted. There is a medium amount of serosanguineous drainage noted. The wound margin is distinct with the outline attached to the wound base. There is small (1-33%) pale granulation within the wound bed. There is a large (67- 100%) amount of necrotic tissue within the wound bed including Adherent Slough. Wound #35 status is Open. Original cause of  wound was Gradually Appeared. The wound is located on the Left Ischium. The wound measures 1cm length x 0.5cm width x 0.1cm depth; 0.393cm^2 area and 0.039cm^3 volume. There is Fat Layer (Subcutaneous Tissue) Exposed exposed. There is no tunneling or undermining noted. There is a medium amount of serosanguineous drainage noted. The wound margin is flat and intact. There is large (67-100%) red, friable granulation within the wound bed. There is no necrotic tissue within the wound bed. Wound #36 status is Healed - Epithelialized. Original cause of wound was Gradually Appeared. The wound is located on the Left,Distal Ischium. The wound measures 0cm length x 0cm width x 0cm depth; 0cm^2 area and 0cm^3 volume. 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 There was a Excisional Skin/Subcutaneous Tissue Debridement with a total area of 7.25 sq cm performed by Ricard Dillon., MD. With the following instrument(s): Curette to remove Viable and Non-Viable tissue/material. Material removed includes Subcutaneous Tissue and Slough and. 1 specimen was taken by a Tissue Culture and sent to the lab per facility protocol. A time out was conducted at 08:18, prior to the start of the procedure. A Minimum amount of bleeding was controlled with Pressure.  The procedure was tolerated well with a pain level of 0 throughout and a pain level of 0 following the procedure. Post Debridement Measurements: 2.9cm length x 2.5cm width x 0.2cm depth; 1.139cm^3 volume. Character of Wound/Ulcer Post Debridement is improved. Post procedure Diagnosis Wound #24: Same as Pre-Procedure Plan Follow-up  Appointments: Return Appointment in 1 week. Dressing Change Frequency: Wound #17 Right,Dorsal Toe - Web between 1st and 2nd: Change Dressing every other day. Wound #24 Left,Dorsal Foot: Change Dressing every other day. Wound #35 Left Ischium: Change Dressing every other day. Skin Barriers/Peri-Wound Care: Antifungal cream - on toes on both feet daily Moisturizing lotion - both legs Other: - Triamcinolone cream Wound #35 Left Ischium: Barrier cream Wound Cleansing: Wound #17 Right,Dorsal Toe - Web between 1st and 2nd: Clean wound with Wound Cleanser Primary Wound Dressing: Wound #17 Right,Dorsal Toe - Web between 1st and 2nd: Calcium Alginate with Silver Wound #24 Left,Dorsal Foot: Calcium Alginate with Silver Wound #35 Left Ischium: Hydrofera Blue Secondary Dressing: Wound #17 Right,Dorsal Toe - Web between 1st and 2nd: Kerlix/Rolled Gauze Dry Gauze Wound #24 Left,Dorsal Foot: Kerlix/Rolled Gauze Dry Gauze Heel Cup - add heel cup to left heel for protection. Wound #35 Left Ischium: Foam Border Edema Control: Elevate legs to the level of the heart or above for 30 minutes daily and/or when sitting, a frequency of: - throughout the day Support Garment 30-40 mm/Hg pressure to: - Juxtalite to both legs. Off-Loading: Low air-loss mattress (Group 2) Roho cushion for wheelchair Turn and reposition every 2 hours - out of wheelchair throughout the day, try to lay on sides, sleep in the bed not the recliner Laboratory ordered were: Anerobic culture - PCR culture - left dorsal foot 1. Going to change both his foot wounds to silver alginate. Predominantly because of the drainage 2. With regards to the area on the left dorsal foot this is gotten larger expanded and changed character i.e. fibrinous debris and drainage she did not use to have. I obtained a specimen/deep tissue specimen for PCR. I will consider biopsying this 3. Area on the right foot and left buttocks appear better.  We are using silver alginate on the right foot and continuing with Hydrofera Blue on the left buttock wounds which are smaller Electronic Signature(s) Signed: 10/22/2019 6:46:53 PM By: Linton Ham MD Entered By: Linton Ham on 10/22/2019 08:57:01 -------------------------------------------------------------------------------- SuperBill Details Patient Name: Date of Service: CRIBBGrace Bushy 10/22/2019 Medical Record [id number] Patient Account Number: [id number] Date of Birth/Sex: Treating RN: [birth year] (33 y.o. M) Primary Care Provider: Douglass, Franklin Other Clinician: Referring Provider: Treating Provider/Extender:Essie Gehret, [name], [name]: 198 Diagnosis Coding ICD-10 Codes Code Description L97.511 Non-pressure chronic ulcer of other part of right 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 L97.521 Non-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, complete 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, stage 3 Facility Procedures CPT4 Code Description: [id number] - DEB SUBQ TISSUE 20 SQ CM/< ICD-10 Diagnosis Description L97.521 Non-pressure chronic ulcer of other part of left foot lim Modifier: ited to breakdo Quantity: 1 wn of skin Physician Procedures CPT4 Code Description: [id number] - WC PHYS SUBQ TISS 20 SQ CM ICD-10 Diagnosis Description L97.521 Non-pressure chronic ulcer of other part of left foot lim Modifier: ited to breakdo Quantity: 1 wn of skin Electronic Signature(s) Signed: 10/22/2019 6:46:53 PM By: Linton Ham MD Entered By: Linton Ham on 10/22/2019 08:57:23

## 2019-10-22 NOTE — Progress Notes (Addendum)
[name], [name] ([id number]) Visit Report for 10/22/2019 Arrival Information Details Patient Name: Date of Service: [name], [name] 10/22/2019 7:30 AM Medical Record [id number] Patient Account Number: [id number] Date of Birth/Sex: Treating RN: [birth year] (33 y.o. [name] Primary Care Kinsley Holderman: [place], [place] Other Clinician: Referring Haidan Nhan: Treating Andelyn Spade/Extender:Robson, [name], [name]: 198 Visit Information History Since Last Visit Added or deleted any medications: No Patient Arrived: Wheel Chair Any new allergies or adverse reactions: No Arrival Time: 07:44 Had a fall or experienced change in No activities of daily living that may affect Accompanied By: self risk of falls: Transfer Assistance: None Signs or symptoms of abuse/neglect since last No Patient Identification Verified: Yes visito Secondary Verification Process Completed: Yes Hospitalized since last visit: No Patient Requires Transmission-Based No Implantable device outside of the clinic excluding No Precautions: cellular tissue based products placed in the center Patient Has Alerts: Yes since last visit: Patient Alerts: R ABI = Has Dressing in Place as Prescribed: Yes 1.0 Has Compression in Place as Prescribed: Yes L ABI = Pain Present Now: No 1.1 Electronic Signature(s) Signed: 10/22/2019 6:19:57 PM By: Deon Pilling Entered By: Deon Pilling on 10/22/2019 07:50:19 -------------------------------------------------------------------------------- Encounter Discharge Information Details Patient Name: Date of Service: [name], [name] 10/22/2019 7:30 AM Medical Record [id number] Patient Account Number: [id number] Date of Birth/Sex: Treating RN: 1985/12/18 (32 y.o. [name] Primary Care Liandro Thelin: [name], [name] Other Clinician: Referring Dejia Ebron: Treating Maeola Mchaney/Extender:Robson, [name], [name]: 198 Encounter Discharge  Information Items Post Procedure Vitals Discharge Condition: Stable Temperature (F): 99 Ambulatory Status: Wheelchair Pulse (bpm): 123 Discharge Destination: Home Respiratory Rate (breaths/min): 20 Transportation: Private Auto Blood Pressure (mmHg): 141/69 Accompanied By: self Schedule Follow-up Appointment: Yes Clinical Summary of Care: Electronic Signature(s) Signed: 10/22/2019 6:19:57 PM By: Deon Pilling Entered By: Deon Pilling on 10/22/2019 08:44:24 -------------------------------------------------------------------------------- Lower Extremity Assessment Details Patient Name: Date of Service: CRIBBFitzroy, [name] 10/22/2019 7:30 AM Medical Record [id number] Patient Account Number: [id number] Date of Birth/Sex: Treating RN: 09-28-1986 (33 y.o. [name] Primary Care Deja Pisarski: [name], [name] Other Clinician: Referring Kynzi Levay: Treating Karrah Mangini/Extender:Robson, [name], [name]: 198 Edema Assessment Assessed: [Left: Yes] [Right: Yes] Edema: [Left: No] [Right: No] Calf Left: Right: Point of Measurement: 33 cm From Medial Instep 28 cm 33 cm Ankle Left: Right: Point of Measurement: 10 cm From Medial Instep 23 cm 24 cm Electronic Signature(s) Signed: 10/22/2019 6:19:57 PM By: Deon Pilling Entered By: Deon Pilling on 10/22/2019 07:54:38 -------------------------------------------------------------------------------- Multi Wound Chart Details Patient Name: Date of Service: [name], [name] 10/22/2019 7:30 AM Medical Record [id number] Patient Account Number: [id number] Date of Birth/Sex: Treating RN: [birth year] (33 y.o. M) Primary Care Loyal Holzheimer: [name], [name] Other Clinician: Referring Geoffrey Hynes: Treating Axavier Pressley/Extender:Robson, [name], [name]: 198 Vital Signs Height(in): 70 Pulse(bpm): 123 Weight(lbs): 216 Blood Pressure(mmHg): 141/69 Body Mass Index(BMI): 31 Temperature(F):  99 Respiratory 20 Rate(breaths/min): Photos: [17:No Photos] [24:No Photos] [35:No Photos] Wound Location: [17:Right Toe - Web between Left Foot - Dorsal 1st and 2nd - Dorsal] [35:Left Ischium] Wounding Event: [17:Gradually Appeared] [24:Gradually Appeared] [35:Gradually Appeared] Primary Etiology: [17:Atypical] [24:Inflammatory] [35:Pressure Ulcer] Comorbid History: [17:Sleep Apnea, Hypertension, Sleep Apnea, Hypertension, Sleep Apnea, Hypertension, Paraplegia] [24:Paraplegia] [35:Paraplegia] Date Acquired: [17:04/07/2017] [24:03/08/2018] [35:04/28/2019] Weeks of Treatment: [17:132] [24:84] [35:25] Wound Status: [17:Open] [24:Open] [35:Open] Clustered Wound: [17:Yes] [24:No] [35:Yes] Clustered Quantity: [17:2] [24:N/A] [35:1] Measurements L x W x D 4.5x3x0.1 [24:2.9x2.5x0.2] [35:1x0.5x0.1] (cm) Area (cm) : [id number] [24:5.694] [35:0.393] Volume (cm) : [17:1.06] [24:1.139] [35:0.039] % Reduction  in Area: [17:-1700.20%] [24:-2312.70%] [35:97.20%] % Reduction in Volume: -1696.60% [24:-4645.80%] [35:97.20%] Classification: [17:Full Thickness Without Exposed Support Structures Exposed Support Structures] [24:Full Thickness Without] [35:Category/Stage III] Exudate Amount: [17:Medium] [24:Medium] [35:Medium] Exudate Type: [17:Serosanguineous] [24:Serosanguineous] [35:Serosanguineous] Exudate Color: [17:red, brown] [24:red, brown] [35:red, brown] Wound Margin: [17:Distinct, outline attached Distinct, outline attached Flat and Intact] Granulation Amount: [17:Medium (34-66%)] [24:Small (1-33%)] [35:Large (67-100%)] Granulation Quality: [17:Pale] [24:Pale] [35:Red, Friable] Necrotic Amount: [17:Medium (34-66%)] [24:Large (67-100%)] [35:None Present (0%)] Exposed Structures: [17:Fat Layer (Subcutaneous Fat Layer (Subcutaneous Fat Layer (Subcutaneous Tissue) Exposed: Yes Fascia: No Tendon: No Muscle: No Joint: No Bone: No] [24:Tissue) Exposed: Yes Fascia: No Tendon: No Muscle: No Joint: No Bone: No]  [35:Tissue) Exposed: Yes  Fascia: No Tendon: No Muscle: No Joint: No Bone: No] Epithelialization: [17:Small (1-33%)] [24:Small (1-33%)] [35:Medium (34-66%)] Debridement: [17:N/A] [24:Debridement - Excisional N/A] Pre-procedure [17:N/A] [24:08:18] [35:N/A] Verification/Time Out Taken: Tissue Debrided: [17:N/A] [24:Subcutaneous, Slough] [35:N/A] Level: [17:N/A] [24:Skin/Subcutaneous Tissue N/A] Debridement Area (sq cm):N/A [24:7.25] [35:N/A] Instrument: [17:N/A] [24:Curette] [35:N/A] Bleeding: [17:N/A] [24:Minimum] [35:N/A] Hemostasis Achieved: [17:N/A] [24:Pressure] [35:N/A] Procedural Pain: [17:N/A] [24:0] [35:N/A] Post Procedural Pain: [17:N/A] [24:0] [35:N/A] Debridement Treatment N/A [24:Procedure was tolerated] [35:N/A] Response: [24:well] Post Debridement [17:N/A] [24:2.9x2.5x0.2] [35:N/A] Measurements L x W x D (cm) Post Debridement [17:N/A] [24:1.139 N/A] Volume: (cm) Procedures Performed: [17:N/A 36] [24:Debridement N/A N/A] [35:N/A] Photos: [17:No Photos] [24:N/A N/A] Wound Location: [17:Left, Distal Ischium] [24:N/A N/A] Wounding Event: [17:Gradually Appeared] [24:N/A N/A] Primary Etiology: [17:Pressure Ulcer] [24:N/A N/A] Comorbid History: [17:N/A] [24:N/A N/A] Date Acquired: [17:10/04/2019] [24:N/A N/A] Weeks of Treatment: [17:2] [24:N/A N/A] Wound Status: [17:Healed - Epithelialized] [24:N/A N/A] Clustered Wound: [17:No] [24:N/A N/A] Clustered Quantity: [17:N/A] [24:N/A N/A] Measurements L x W x D 0x0x0 [24:N/A N/A] (cm) Area (cm) : [17:0] [24:N/A] [35:N/A] Volume (cm) : [17:0] [24:N/A] [35:N/A] % Reduction in Area: [17:100.00%] [24:N/A N/A] % Reduction in Volume: 100.00% [24:N/A N/A] Classification: [17:Category/Stage II] [24:N/A N/A] Exudate Amount: [17:N/A] [24:N/A N/A] Exudate Type: [17:N/A] [24:N/A N/A] Exudate Color: [17:N/A] [24:N/A N/A] Wound Margin: [17:N/A] [24:N/A N/A] Granulation Amount: [17:N/A] [24:N/A N/A] Granulation Quality: [17:N/A]  [24:N/A N/A] Necrotic Amount: [17:N/A] [24:N/A N/A] Exposed Structures: [17:N/A] [24:N/A N/A] Epithelialization: [17:N/A] [24:N/A N/A] Debridement: [17:N/A] [24:N/A N/A] Tissue Debrided: [17:N/A] [24:N/A N/A] Level: [17:N/A] [24:N/A N/A] Debridement Area (sq cm):N/A [24:N/A N/A] Instrument: [17:N/A] [24:N/A N/A] Bleeding: [17:N/A] [24:N/A N/A] Hemostasis Achieved: [17:N/A] [24:N/A N/A] Procedural Pain: [17:N/A] [24:N/A N/A] Post Procedural Pain: [17:N/A] [24:N/A N/A] Debridement Treatment N/A [24:N/A N/A] Response: Post Debridement [17:N/A] [24:N/A N/A] Measurements L x W x D (cm) Post Debridement [17:N/A] [24:N/A N/A] Volume: (cm) Procedures Performed: N/A [24:N/A N/A] Treatment Notes Wound #17 (Right, Dorsal Toe - Web between 1st and 2nd) 1. Cleanse With Wound Cleanser 2. Periwound Care Antifungal cream Moisturizing lotion TCA Cream 3. Primary Dressing Applied Calcium Alginate Ag 4. Secondary Dressing Dry Gauze Roll Gauze 5. Secured With Medipore tape 6. Support Layer Applied Compression garment Notes juxtalite to BLE. Wound #24 (Left, Dorsal Foot) 1. Cleanse With Wound Cleanser 2. Periwound Care Antifungal cream Moisturizing lotion TCA Cream 3. Primary Dressing Applied Calcium Alginate Ag 4. Secondary Dressing Dry Gauze Roll Gauze 5. Secured With Medipore tape 6. Support Layer Applied Compression garment Notes juxtalite to BLE. Wound #35 (Left Ischium) 1. Cleanse With Wound Cleanser 3. Primary Dressing Applied Hydrofera Blue 4. Secondary Dressing Foam Border Dressing 5. Secured With Office manager) Signed: 10/22/2019 6:46:53 PM By: Linton Ham MD Entered By: Linton Ham on 10/22/2019 08:52:35 -------------------------------------------------------------------------------- Multi-Disciplinary Care Plan Details Patient Name: Date  of Service: [name], [name] 10/22/2019 7:30 AM Medical Record  [id number] Patient Account Number: [id number] Date of Birth/Sex: Treating RN: [birth year] (33 y.o. [name] Primary Care Jamie Belger: [name], [name] Other Clinician: Referring Tanganika Barradas: Treating Tamaj Jurgens/Extender:Robson, [name], [name]: 198 Active Inactive Wound/Skin Impairment Nursing Diagnoses: Impaired tissue integrity Knowledge deficit related to ulceration/compromised skin integrity Goals: Patient/caregiver will verbalize understanding of skin care regimen Date Initiated: 01/05/2016 Target Resolution Date: 11/23/2019 Goal Status: Active Ulcer/skin breakdown will have a volume reduction of 30% by week 4 Date Initiated: 01/05/2016 Date Inactivated: 12/22/2017 Target Resolution Date: 01/19/2018 Unmet Reason: complex Goal Status: Unmet wounds, infection Interventions: Assess patient/caregiver ability to obtain necessary supplies Assess ulceration(s) every visit Provide education on ulcer and skin care Notes: Electronic Signature(s) Signed: 10/22/2019 6:27:46 PM By: Levan Hurst RN, BSN Entered By: Levan Hurst on 10/22/2019 07:58:20 -------------------------------------------------------------------------------- Pain Assessment Details Patient Name: Date of Service: [name], [name] 10/22/2019 7:30 AM Medical Record [id number] Patient Account Number: [id number] Date of Birth/Sex: Treating RN: 1986/02/14 (33 y.o. [name] Primary Care Terry Abila: [place], [place] Other Clinician: Referring Merlyn Bollen: Treating Liboria Putnam/Extender:Robson, [name], [name]: 198 Active Problems Location of Pain Severity and Description of Pain Patient Has Paino No Site Locations Rate the pain. Current Pain Level: 0 Pain Management and Medication Current Pain Management: Medication: No Cold Application: No Rest: No Massage: No Activity: No T.FerrellN.S.: No Heat Application: No Leg drop or elevation: No Is the Current Pain  Management Adequate: Adequate How does your wound impact your activities of daily livingo Sleep: No Bathing: No Appetite: No Relationship With Others: No Bladder Continence: No Emotions: No Bowel Continence: No Work: No Toileting: No Drive: No Dressing: No Hobbies: No Electronic Signature(s) Signed: 10/22/2019 6:19:57 PM By: Deon Pilling Entered By: Deon Pilling on 10/22/2019 07:50:33 -------------------------------------------------------------------------------- Patient/Caregiver Education Details Patient Name: Date of Service: CRIBBGrace Bushy 11/30/2020andnbsp7:30 AM Medical Record [phone] Patient Account Number: [id number] Date of Birth/Gender: November 13, 1986 (33 y.o. M) Treating RN: Levan Hurst Primary Care Physician: Janine Limbo Other Clinician: Referring Physician: Treating Physician/Extender:Robson, Pecola Leisure in Treatment: 198 Education Assessment Education Provided To: Patient Education Topics Provided Wound/Skin Impairment: Methods: Explain/Verbal Responses: State content correctly Electronic Signature(s) Signed: 10/22/2019 6:27:46 PM By: Levan Hurst RN, BSN Entered By: Levan Hurst on 10/22/2019 07:58:34 -------------------------------------------------------------------------------- Wound Assessment Details Patient Name: Date of Service: [name], [name] 10/22/2019 7:30 AM Medical Record [id number] Patient Account Number: [id number] Date of Birth/Sex: Treating RN: 1986/01/19 (33 y.o. [name] Primary Care Aviah Sorci: [place], [place] Other Clinician: Referring Londen Lorge: Treating Janie Capp/Extender:Robson, [name], [name]: 198 Wound Status Wound Number: 17 Primary Etiology: Atypical Wound Location: Right Toe - Web between 1st and Wound Status: Open 2nd - Dorsal Comorbid Sleep Apnea, Hypertension, Wounding Event: Gradually Appeared History: Paraplegia Date Acquired: 04/07/2017 Weeks Of  Treatment: 132 Clustered Wound: Yes Photos Wound Measurements Length: (cm) 4.5 % Reduct Width: (cm) 3 % Reduct Depth: (cm) 0.1 Epitheli Clustered Quantity: 2 Tunnelin Area: (cm) 10.603 Undermi Volume: (cm) 1.06 Wound Description Full Thickness Without Exposed Support Foul Od Classification: Structures Slough/ Wound Distinct, outline attached Margin: Exudate Medium Amount: Exudate Serosanguineous Type: Exudate red, brown Color: Wound Bed Granulation Amount: Medium (34-66%) Granulation Quality: Pale Fascia Ex Necrotic Amount: Medium (34-66%) Fat Layer Necrotic Quality: Adherent Slough Tendon Ex Muscle Ex Joint Exp Bone Expo or After Cleansing: No Fibrino Yes Exposed Structure posed: No (Subcutaneous Tissue) Exposed: Yes posed: No posed: No osed: No sed: No ion in  Area: -1700.2% ion in Volume: -1696.6% alization: Small (1-33%) g: No ning: No Treatment Notes Wound #17 (Right, Dorsal Toe - Web between 1st and 2nd) 1. Cleanse With Wound Cleanser 2. Periwound Care Antifungal cream Moisturizing lotion TCA Cream 3. Primary Dressing Applied Calcium Alginate Ag 4. Secondary Dressing Dry Gauze Roll Gauze 5. Secured With Medipore tape 6. Support Layer Applied Compression garment Notes juxtalite to BLE. Electronic Signature(s) Signed: 10/23/2019 4:40:25 PM By: Mikeal Hawthorne EMT/HBOT Signed: 10/24/2019 6:35:56 PM By: Deon Pilling Previous Signature: 10/22/2019 6:19:57 PM Version By: Deon Pilling Entered By: Mikeal Hawthorne on 10/23/2019 09:18:38 -------------------------------------------------------------------------------- Wound Assessment Details Patient Name: Date of Service: [name], [name] 10/22/2019 7:30 AM Medical Record [id number] Patient Account Number: [id number] Date of Birth/Sex: Treating RN: 03-25-1986 (33 y.o. [name] Primary Care Davinity Fanara: [place], [place] Other Clinician: Referring Victorious Kundinger: Treating  Tionne Carelli/Extender:Robson, [name], [name]: 198 Wound Status Wound Number: 24 Primary Etiology: Inflammatory Wound Location: Left Foot - Dorsal Wound Status: Open Wounding Event: Gradually Appeared Comorbid Sleep Apnea, Hypertension, History: Paraplegia Date Acquired: 03/08/2018 Weeks Of Treatment: 84 Clustered Wound: No Photos Wound Measurements Length: (cm) 2.9 % Reduct Width: (cm) 2.5 % Reduct Depth: (cm) 0.2 Epitheli Area: (cm) 5.694 Tunneli Volume: (cm) 1.139 Undermi Wound Description Classification: Full Thickness Without Exposed Support Foul Odo Structures Slough/F Wound Distinct, outline attached Margin: Exudate Medium Amount: Exudate Serosanguineous Type: Exudate red, brown Color: Wound Bed Granulation Amount: Small (1-33%) Granulation Quality: Pale Fascia E Necrotic Amount: Large (67-100%) Fat Laye Necrotic Quality: Adherent Slough Tendon E Muscle E Joint Ex Bone Exp r After Cleansing: No ibrino Yes Exposed Structure xposed: No r (Subcutaneous Tissue) Exposed: Yes xposed: No xposed: No posed: No osed: No ion in Area: -2312.7% ion in Volume: -4645.8% alization: Small (1-33%) ng: No ning: No Treatment Notes Wound #24 (Left, Dorsal Foot) 1. Cleanse With Wound Cleanser 2. Periwound Care Antifungal cream Moisturizing lotion TCA Cream 3. Primary Dressing Applied Calcium Alginate Ag 4. Secondary Dressing Dry Gauze Roll Gauze 5. Secured With Medipore tape 6. Support Layer Applied Compression garment Notes juxtalite to BLE. Electronic Signature(s) Signed: 10/23/2019 4:40:25 PM By: Mikeal Hawthorne EMT/HBOT Signed: 10/24/2019 6:35:56 PM By: Deon Pilling Previous Signature: 10/22/2019 6:19:57 PM Version By: Deon Pilling Entered By: Mikeal Hawthorne on 10/23/2019 09:20:19 -------------------------------------------------------------------------------- Wound Assessment Details Patient Name: Date of Service: [name],  [name] 10/22/2019 7:30 AM Medical Record [id number] Patient Account Number: [id number] Date of Birth/Sex: Treating RN: December 27, 1985 (33 y.o. [name] Primary Care Koston Hennes: [place], [place] Other Clinician: Referring Merilee Wible: Treating Quenten Nawaz/Extender:Robson, [name], [name]: 198 Wound Status Wound Number: 35 Primary Etiology: Pressure Ulcer Wound Location: Left Ischium Wound Status: Open Wounding Event: Gradually Appeared Comorbid Sleep Apnea, Hypertension, History: Paraplegia Date Acquired: 04/28/2019 Weeks Of Treatment: 25 Clustered Wound: Yes Photos Wound Measurements Length: (cm) 1 Width: (cm) 0.5 Depth: (cm) 0.1 Clustered Quantity: 1 Area: (cm) 0.393 Volume: (cm) 0.039 Wound Description Classification: Category/Stage III Wound Margin: Flat and Intact Exudate Amount: Medium Exudate Type: Serosanguineous Exudate Color: red, brown Wound Bed Granulation Amount: Large (67-100%) Granulation Quality: Red, Friable Necrotic Amount: None Present (0%) Foul Odor After Cleansing: N Slough/Fibrino Y Exposed Structure Fascia Exposed: N Fat Layer (Subcutaneous Tissue) Exposed: Y Tendon Exposed: N Muscle Exposed: N Joint Exposed: N Bone Exposed: N % Reduction in Area: 97.2% % Reduction in Volume: 97.2% Epithelialization: Medium (34-66%) Tunneling: No Undermining: No o es o es o o o o Treatment Notes Wound #35 (Left Ischium) 1. Cleanse  With Wound Cleanser 3. Primary Dressing Applied Hydrofera Blue 4. Secondary Dressing Foam Border Dressing 5. Secured With Office manager) Signed: 10/23/2019 4:40:25 PM By: Mikeal Hawthorne EMT/HBOT Signed: 10/24/2019 6:35:56 PM By: Deon Pilling Previous Signature: 10/22/2019 6:19:57 PM Version By: Deon Pilling Entered By: Mikeal Hawthorne on 10/23/2019 09:17:27 -------------------------------------------------------------------------------- Wound Assessment  Details Patient Name: Date of Service: [name], [name] 10/22/2019 7:30 AM Medical Record [id number] Patient Account Number: [id number] Date of Birth/Sex: Treating RN: 04-10-1986 (33 y.o. [name] Primary Care Alaijah Gibler: [place], [place] Other Clinician: Referring Kiyoko Mcguirt: Treating Thandiwe Siragusa/Extender:Robson, [name], [name]: 198 Wound Status Wound Number: 36 Primary Etiology: Pressure Ulcer Wound Location: Left Ischium - Distal Wound Status: Healed - Epithelialized Wounding Event: Gradually Appeared Comorbid Sleep Apnea, Hypertension, History: Paraplegia Date Acquired: 10/04/2019 Weeks Of Treatment: 2 Clustered Wound: No Photos Wound Measurements Length: (cm) 0 % Reducti Width: (cm) 0 % Reducti Depth: (cm) 0 Epithelia Area: (cm) 0 Volume: (cm) 0 Wound Description Classification: Category/Stage II Exudate Amount: Medium Exudate Type: Serosanguineous Exudate Color: red, brown Wound Bed Granulation Amount: Large (67-100%) Granulation Quality: Red, Pink Foul Odor After Cleansing: No Slough/Fibrino No Exposed Structure Fascia Exposed: No Fat Layer (Subcutaneous Tissue) Exposed: Yes Tendon Exposed: No Muscle Exposed: No Joint Exposed: No Bone Exposed: No on in Area: 100% on in Volume: 100% lization: None Electronic Signature(s) Signed: 10/23/2019 4:40:25 PM By: Mikeal Hawthorne EMT/HBOT Signed: 10/24/2019 6:35:56 PM By: Deon Pilling Previous Signature: 10/22/2019 6:19:57 PM Version By: Deon Pilling Entered By: Mikeal Hawthorne on 10/23/2019 09:16:45 -------------------------------------------------------------------------------- Vitals Details Patient Name: Date of Service: [name], [name] 10/22/2019 7:30 AM Medical Record [id number] Patient Account Number: [id number] Date of Birth/Sex: Treating RN: May 11, 1986 (33 y.o. [name] Primary Care Arther Heisler: [place], [place] Other Clinician: Referring Bevin Mayall: Treating  Richardine Peppers/Extender:Robson, [name], [name]: 198 Vital Signs Time Taken: 07:49 Temperature (F): 99 Height (in): 70 Pulse (bpm): 123 Weight (lbs): 216 Respiratory Rate (breaths/min): 20 Body Mass Index (BMI): 31 Blood Pressure (mmHg): 141/69 Reference Range: 80 - 120 mg / dl Electronic Signature(s) Signed: 10/22/2019 6:19:57 PM By: Deon Pilling Entered By: Deon Pilling on 10/22/2019 07:54:17

## 2019-10-23 DIAGNOSIS — L97922 Non-pressure chronic ulcer of unspecified part of left lower leg with fat layer exposed: Secondary | ICD-10-CM | POA: Diagnosis not present

## 2019-10-25 ENCOUNTER — Encounter: Payer: Self-pay | Admitting: Psychiatry

## 2019-10-25 ENCOUNTER — Ambulatory Visit (INDEPENDENT_AMBULATORY_CARE_PROVIDER_SITE_OTHER): Payer: BC Managed Care – PPO | Admitting: Psychiatry

## 2019-10-25 ENCOUNTER — Other Ambulatory Visit: Payer: Self-pay

## 2019-10-25 DIAGNOSIS — N319 Neuromuscular dysfunction of bladder, unspecified: Secondary | ICD-10-CM

## 2019-10-25 DIAGNOSIS — G4733 Obstructive sleep apnea (adult) (pediatric): Secondary | ICD-10-CM

## 2019-10-25 DIAGNOSIS — Z9989 Dependence on other enabling machines and devices: Secondary | ICD-10-CM

## 2019-10-25 DIAGNOSIS — F341 Dysthymic disorder: Secondary | ICD-10-CM

## 2019-10-25 MED ORDER — SERTRALINE HCL 100 MG PO TABS: 100.0000 mg | ORAL_TABLET | Freq: Every day | ORAL | 5 refills | Status: DC

## 2019-10-25 NOTE — Progress Notes (Signed)
[facility]  Patient ID: [name],  MRN: [id number]  PCP: [name] Limbo, PA-C  Date of Evaluation: 10/25/2019 Time spent:10 minutes from 0910 to 0920  Chief Complaint:  Chief Complaint    Depression      HISTORY/CURRENT STATUS: [name] is provided telemedicine audiovisual appointment session, declining the video camera for his neurologic disorders from spinal cord injury, phone to phone 10 minutes individually with consent with epic collateral for psychiatric interview and exam in 34-month evaluation and management of persistent depressive disorder having neurological medications with psychiatric effects for his obstructive sleep apnea and neurogenic bladder.  However [name] reports in the interim he has done well stating his Zoloft is taken in the morning 100 mg and using melatonin 10 mg at night having stopped his trazodone since last appointment.  He does continue imipramine 50 mg nightly but is off of CPAP.  [name] registry documents last phentermine for weight control with paraparesis to have been 10/23/2018 as the 37.5 mg daily.  Binge eating diagnosis is not therefore consolidated or sustained but provisional from the past.  Motivation and fulfillment are targets for treatment and therefore the only psychiatrically provided medication is the Zoloft.  He has no mania, suicidality, psychosis, or delirium.   Individual Medical History/ Review of Systems: Changes? :Yes He appears to have taken Contrave last [place] also for binge eating only briefly educated about the medication last appointment here.  Otherwise he continues his Zestoretic, Ditropan, and Lioresal.  Allergies: Sulfa antibiotics, Levofloxacin, Meropenem, Penicillins, and Zyvox [linezolid]  Current Medications:  Current Outpatient Medications:  .  Amino Acids-Protein Hydrolys (FEEDING SUPPLEMENT, PRO-STAT SUGAR FREE 64,) LIQD, Take 30 mLs by mouth 2 (two) times daily., Disp: , Rfl:  .  Ascorbic Acid (VITAMIN C) 1000  MG tablet, Take 1,000 mg by mouth daily., Disp: , Rfl:  .  baclofen (LIORESAL) 20 MG tablet, Take 40-60 mg by mouth 3 (three) times daily. 60mg  in the morning, 40mg  in the afternoon, and 60mg  in the evening, Disp: , Rfl:  .  CONTRAVE 8-90 MG TB12, Take 1 tablet by mouth daily., Disp: , Rfl:  .  imipramine (TOFRANIL) 50 MG tablet, Take 50 mg by mouth at bedtime. , Disp: , Rfl:  .  ketoconazole (NIZORAL) 2 % cream, APPLY TOPICALLY TO THE AFFECTED AREA WITH DRESSING CHANGES, Disp: , Rfl: 1 .  lisinopril-hydrochlorothiazide (PRINZIDE,ZESTORETIC) 20-25 MG per tablet, Take 1 tablet by mouth daily., Disp: , Rfl:  .  Melatonin 10 MG CAPS, Take by mouth. Nightly, Disp: , Rfl:  .  Multiple Vitamin (MULTIVITAMIN) tablet, Take 1 tablet by mouth daily., Disp: , Rfl:  .  nitrofurantoin, macrocrystal-monohydrate, (MACROBID) 100 MG capsule, TK 1 C PO BID, Disp: , Rfl: 0 .  oxybutynin (DITROPAN XL) 15 MG 24 hr tablet, Take 30 mg by mouth at bedtime. , Disp: , Rfl:  .  SANTYL ointment, , Disp: , Rfl: 1 .  sertraline (ZOLOFT) 100 MG tablet, Take 1 tablet (100 mg total) by mouth daily after breakfast., Disp: 30 tablet, Rfl: 5 .  zinc sulfate 220 (50 Zn) MG capsule, Take 220 mg by mouth daily., Disp: , Rfl:   Medication Side Effects: none  Family Medical/ Social History: Changes? No  MENTAL HEALTH EXAM:  There were no vitals taken for this visit.There is no height or weight on file to calculate BMI.  As not present here today  General Appearance: N/A  Eye Contact:  N/A  Speech:  Blocked, Clear and Coherent,  Normal Rate and Talkative  Volume:  Normal  Mood:  Depressed, Dysphoric and Euthymic  Affect:  Congruent, Constricted, Depressed and Inappropriate  Thought Process:  Coherent, Goal Directed and Descriptions of Associations: Tangential  Orientation:  Full (Time, Place, and Person)  Thought Content: Rumination and Tangential   Suicidal Thoughts:  No  Homicidal Thoughts:  No  Memory:  Immediate;    Good Remote;   Good  Judgement:  Fair  Insight:  Fair  Psychomotor Activity:  N/A  Concentration:  Concentration: Fair and Attention Span: Good  Recall:  Good  Fund of Knowledge: Good  Language: Good  Assets:  Leisure Time Resilience Talents/Skills  ADL's:  Intact  Cognition: WNL  Prognosis:  Fair    DIAGNOSES:    ICD-10-CM   1. Persistent depressive disorder with melancholic features, currently mild  F34.1 sertraline (ZOLOFT) 100 MG tablet  2. Obstructive sleep apnea treated with continuous positive airway pressure (CPAP)  G47.33    Z99.89   3. Neurogenic bladder  N31.9     Receiving Psychotherapy: No    RECOMMENDATIONS: Psychosupportive psychoeducation integrates cognitive behavioral nutrition, sleep hygiene, social skills, and frustration management with medication symptom treatment matching.  Zoloft is sufficient currently along with his over-the-counter melatonin 10 mg nightly for insomnia.  He is not using CPAP or trazodone.  Tofranil 50 mg nightly is unchanged from neurology.  eScription is provided for Zoloft 100 mg every morning sent as a 30-day supply and 5 refills to [facility] on [address].  Prevention and monitoring and safety hygiene are addressed.  He returns for follow-up in 6 months.  Virtual Visit via Video Note  I connected with [name] on 10/25/19 at  9:00 AM EST by a video enabled telemedicine application and verified that I am speaking with the correct person using two identifiers.  Location: Patient: Individually audio only at personal residence declining video camera for neurological diagnoses Provider: [facility] office   I discussed the limitations of evaluation and management by telemedicine and the availability of in person appointments. The patient expressed understanding and agreed to proceed.  History of Present Illness: 45-month evaluation and management address persistent depressive disorder having  neurological medications with psychiatric effects for his obstructive sleep apnea and neurogenic bladder.  However [name] reports in the interim he has done well stating his Zoloft is taken in the morning 100 mg and using melatonin 10 mg at night having stopped his trazodone since last appointment.   Observations/Objective: Mood:  Depressed, Dysphoric and Euthymic  Affect:  Congruent, Constricted, Depressed and Inappropriate  Thought Process:  Coherent, Goal Directed and Descriptions of Associations: Tangential  Orientation:  Full (Time, Place, and Person)  Thought Content: Rumination and Tangential    Assessment and Plan: Psychosupportive psychoeducation integrates cognitive behavioral nutrition, sleep hygiene, social skills, and frustration management with medication symptom treatment matching.  Zoloft is sufficient currently along with his over-the-counter melatonin 10 mg nightly for insomnia.  He is not using CPAP or trazodone.  Tofranil 50 mg nightly is unchanged from neurology.  eScription is provided for Zoloft 100 mg every morning sent as a 30-day supply and 5 refills to [facility] on [address].  Prevention and monitoring and safety hygiene are addressed  Follow Up Instructions: He returns for follow-up in 6 months.   I discussed the assessment and treatment plan with the patient. The patient was provided an opportunity to ask questions and all were answered. The patient agreed with the plan  and demonstrated an understanding of the instructions.   The patient was advised to call back or seek an in-person evaluation if the symptoms worsen or if the condition fails to improve as anticipated.  I provided 10 minutes of non-face-to-face time during this encounter. [employer] meeting [id number] Meeting password: [email or web address]  Delight Hoh, MD  Delight Hoh, MD

## 2019-10-29 ENCOUNTER — Other Ambulatory Visit: Payer: Self-pay

## 2019-10-29 ENCOUNTER — Encounter (HOSPITAL_BASED_OUTPATIENT_CLINIC_OR_DEPARTMENT_OTHER): Payer: BC Managed Care – PPO | Admitting: Internal Medicine

## 2019-10-29 ENCOUNTER — Other Ambulatory Visit (HOSPITAL_BASED_OUTPATIENT_CLINIC_OR_DEPARTMENT_OTHER): Payer: Self-pay | Admitting: Internal Medicine

## 2019-10-29 DIAGNOSIS — I10 Essential (primary) hypertension: Secondary | ICD-10-CM | POA: Insufficient documentation

## 2019-10-29 DIAGNOSIS — G4730 Sleep apnea, unspecified: Secondary | ICD-10-CM | POA: Insufficient documentation

## 2019-10-29 DIAGNOSIS — L97511 Non-pressure chronic ulcer of other part of right 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: Secondary | ICD-10-CM | POA: Diagnosis not present

## 2019-10-29 DIAGNOSIS — L98419 Non-pressure chronic ulcer of buttock with unspecified severity: Secondary | ICD-10-CM | POA: Diagnosis not present

## 2019-10-29 DIAGNOSIS — L309 Dermatitis, unspecified: Secondary | ICD-10-CM | POA: Diagnosis not present

## 2019-10-29 DIAGNOSIS — L97521 Non-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: Secondary | ICD-10-CM | POA: Insufficient documentation

## 2019-10-29 DIAGNOSIS — G8220 Paraplegia, unspecified: Secondary | ICD-10-CM | POA: Insufficient documentation

## 2019-10-29 DIAGNOSIS — L97522 Non-pressure chronic ulcer of other part of left foot with fat layer exposed: Secondary | ICD-10-CM | POA: Diagnosis not present

## 2019-10-30 NOTE — Progress Notes (Signed)
Lias, ORLONDO AKHTER ([id number]) Visit Report for 10/08/2019 Arrival Information Details Patient Name: Date of Service: MUSE, [place] 10/08/2019 7:30 AM Medical Record [id number] Patient Account Number: [id number] Date of Birth/Sex: Treating RN: Sep 28, 1986 (33 y.o. [name]) Carlene Coria Primary Care Micai Apolinar: [name], [name] Other Clinician: Referring Bobbyjo Marulanda: Treating Teancum Brule/Extender:Robson, [name], [name]: 196 Visit Information History Since Last Visit All ordered tests and consults were completed: No Patient Arrived: Wheel Chair Added or deleted any medications: No Arrival Time: 07:50 Any new allergies or adverse reactions: No Accompanied By: self Had a fall or experienced change in No activities of daily living that may affect Transfer Assistance: None risk of falls: Patient Identification Verified: Yes Signs or symptoms of abuse/neglect since last No Secondary Verification Process Completed: Yes visito Patient Requires Transmission-Based No Hospitalized since last visit: No Precautions: Implantable device outside of the clinic excluding No Patient Has Alerts: Yes cellular tissue based products placed in the center Patient Alerts: R ABI = since last visit: 1.0 Has Dressing in Place as Prescribed: Yes L ABI = Has Compression in Place as Prescribed: Yes 1.1 Pain Present Now: No Electronic Signature(s) Signed: 10/30/2019 2:50:47 PM By: Carlene Coria RN Entered By: Carlene Coria on 10/08/2019 07:51:24 -------------------------------------------------------------------------------- Encounter Discharge Information Details Patient Name: Date of Service: Slinker, [name] 10/08/2019 7:30 AM Medical Record [id number] Patient Account Number: [id number] Date of Birth/Sex: Treating RN: 1986-09-11 (34 y.o. [name] Primary Care Saachi Zale: [name], [name] Other Clinician: Referring Adonys Wildes: Treating Coren Sagan/Extender:Robson, [name],  [name]: 196 Encounter Discharge Information Items Post Procedure Vitals Discharge Condition: Stable Temperature (F): 98 Ambulatory Status: Wheelchair Pulse (bpm): 115 Discharge Destination: Home Respiratory Rate (breaths/min): 16 Transportation: Private Auto Blood Pressure (mmHg): 123/67 Accompanied By: self Schedule Follow-up Appointment: Yes Clinical Summary of Care: Electronic Signature(s) Signed: 10/08/2019 5:37:24 PM By: Deon Pilling Entered By: Deon Pilling on 10/08/2019 08:50:01 -------------------------------------------------------------------------------- Lower Extremity Assessment Details Patient Name: Date of Service: RYELIN, CHAPARRO 10/08/2019 7:30 AM Medical Record [id number] Patient Account Number: [id number] Date of Birth/Sex: Treating RN: Oct 15, 1986 (33 y.o. [name] Primary Care Korie Streat: [place], [place] Other Clinician: Referring Jaylee Freeze: Treating Pastor Sgro/Extender:Robson, [name], [name]: 196 Edema Assessment Assessed: [Left: No] [Right: No] Edema: [Left: No] [Right: No] Calf Left: Right: Point of Measurement: 33 cm From Medial Instep 34.5 cm 40 cm Ankle Left: Right: Point of Measurement: 10 cm From Medial Instep 23 cm 23 cm Electronic Signature(s) Signed: 10/30/2019 2:50:47 PM By: Carlene Coria RN Entered By: Carlene Coria on 10/08/2019 08:09:42 -------------------------------------------------------------------------------- Multi Wound Chart Details Patient Name: Date of Service: [place], [name] 10/08/2019 7:30 AM Medical Record [id number] Patient Account Number: [id number] Date of Birth/Sex: Treating RN: 05/19/1986 (34 y.o. [name] Primary Care Azeem Poorman: [name], [name] Other Clinician: Referring Kamy Poinsett: Treating Yazleemar Strassner/Extender:Robson, [name], [name]: 196 Vital Signs Height(in): 70 Pulse(bpm): 115 Weight(lbs): 216 Blood Pressure(mmHg):  123/67 Body Mass Index(BMI): 31 Temperature(F): 98 Respiratory 16 Rate(breaths/min): Photos: [17:No Photos] [24:No Photos] [35:No Photos] Wound Location: [17:Right Toe - Web between Left Foot - Dorsal 1st and 2nd - Dorsal] [35:Left Ischium] Wounding Event: [17:Gradually Appeared] [24:Gradually Appeared] [35:Gradually Appeared] Primary Etiology: [17:Atypical] [24:Inflammatory] [35:Pressure Ulcer] Comorbid History: [17:Sleep Apnea, Hypertension, Sleep Apnea, Hypertension, Sleep Apnea, Hypertension, Paraplegia] [24:Paraplegia] [35:Paraplegia] Date Acquired: [17:04/07/2017] [24:03/08/2018] [35:04/28/2019] Weeks of Treatment: [17:130] [24:82] [35:23] Wound Status: [17:Open] [24:Open] [35:Open] Clustered Wound: [17:Yes] [24:No] [35:Yes] Clustered Quantity: [17:2] [24:N/A] [35:2] Measurements L x W x D 4.5x2.7x0.1 [24:2.5x4.2x0.1] [35:3.5x1.5x0.1] (cm) Area (cm) : [  17:9.543] [24:8.247] [35:4.123] Volume (cm) : [17:0.954] [24:0.825] [35:0.412] % Reduction in Area: [17:-1520.20%] [24:-3394.50%] [35:70.80%] % Reduction in Volume: -1516.90% [24:-3337.50%] [35:70.90%] Classification: [17:Full Thickness Without Exposed Support Structures Exposed Support Structures] [24:Full Thickness Without] [35:Category/Stage III] Exudate Amount: [17:Medium] [24:Medium] [35:Medium] Exudate Type: [17:Serosanguineous] [24:Serosanguineous] [35:Serosanguineous] Exudate Color: [17:red, brown] [24:red, brown] [35:red, brown] Wound Margin: [17:Distinct, outline attached Distinct, outline attached Flat and Intact] Granulation Amount: [17:Small (1-33%)] [24:Small (1-33%)] [35:Large (67-100%)] Granulation Quality: [17:Pale] [24:Pale] [35:Red, Friable] Necrotic Amount: [17:Large (67-100%)] [24:Large (67-100%)] [35:Small (1-33%)] Exposed Structures: [17:Fat Layer (Subcutaneous Fat Layer (Subcutaneous Fat Layer (Subcutaneous Tissue) Exposed: Yes Fascia: No Tendon: No Muscle: No Joint: No Bone: No] [24:Tissue) Exposed: Yes  Fascia: No Tendon: No Muscle: No Joint: No Bone: No] [35:Tissue) Exposed: Yes  Fascia: No Tendon: No Muscle: No Joint: No Bone: No] Epithelialization: [17:None] [24:None] [35:None] Debridement: [17:Debridement - Excisional Debridement - Excisional N/A] Pre-procedure [17:08:25] [24:08:25] [35:N/A] Verification/Time Out Taken: Tissue Debrided: [17:Subcutaneous, Slough] [24:Subcutaneous, Slough] [35:N/A] Level: [17:Skin/Subcutaneous Tissue Skin/Subcutaneous Tissue N/A] Debridement Area (sq cm):4 [24:10.5] [35:N/A] Instrument: [17:Curette] [24:Curette] [35:N/A] Bleeding: [17:Moderate] [24:Moderate] [35:N/A] Hemostasis Achieved: [17:Silver Nitrate] [24:Silver Nitrate] [35:N/A] Procedural Pain: [17:0] [24:0] [35:N/A] Post Procedural Pain: [17:0] [24:0] [35:N/A] Debridement Treatment Procedure was tolerated [24:Procedure was tolerated] [35:N/A] Response: [17:well] [24:well] Post Debridement [17:4.5x2.7x0.1] [24:2.5x4.2x0.1] [35:N/A] Measurements L x W x D (cm) Post Debridement [17:0.954] [24:0.825 N/A] Volume: (cm) Procedures Performed: Debridement [17:36] [24:Debridement N/A N/A] [35:N/A] Photos: [17:No Photos] [24:N/A N/A] Wound Location: [17:Left Ischium - Distal] [24:N/A N/A] Wounding Event: [17:Gradually Appeared] [24:N/A N/A] Primary Etiology: [17:Pressure Ulcer] [24:N/A N/A] Comorbid History: [17:Sleep Apnea, Hypertension, N/A Paraplegia] [24:N/A] Date Acquired: [17:10/04/2019] [24:N/A N/A] Weeks of Treatment: [17:0] [24:N/A N/A] Wound Status: [17:Open] [24:N/A N/A] Clustered Wound: [17:No] [24:N/A N/A] Clustered Quantity: [17:N/A] [24:N/A N/A] Measurements L x W x D 1x0.4x0.1 [24:N/A N/A] (cm) Area (cm) : [17:0.314] [24:N/A] [35:N/A] Volume (cm) : [17:0.031] [24:N/A] [35:N/A] % Reduction in Area: [17:N/A] [24:N/A N/A] % Reduction in Volume: N/A [24:N/A N/A] Classification: [17:Category/Stage II] [24:N/A N/A] Exudate Amount: [17:Medium] [24:N/A N/A] Exudate Type:  [17:Serosanguineous] [24:N/A N/A] Exudate Color: [17:red, brown] [24:N/A N/A] Wound Margin: [17:N/A] [24:N/A N/A] Granulation Amount: [17:Large (67-100%)] [24:N/A N/A] Granulation Quality: [17:Red, Pink] [24:N/A N/A] Necrotic Amount: [17:N/A] [24:N/A N/A] Exposed Structures: [17:Fat Layer (Subcutaneous N/A Tissue) Exposed: Yes Fascia: No Tendon: No Muscle: No Joint: No Bone: No] [24:N/A] Epithelialization: [17:None] [24:N/A N/A] Debridement: [17:N/A] [24:N/A N/A] Tissue Debrided: [17:N/A] [24:N/A N/A] Level: [17:N/A] [24:N/A N/A] Debridement Area (sq cm):N/A [24:N/A N/A] Instrument: [17:N/A] [24:N/A N/A] Bleeding: [17:N/A] [24:N/A N/A] Hemostasis Achieved: [17:N/A] [24:N/A N/A] Procedural Pain: [17:N/A] [24:N/A N/A] Post Procedural Pain: [17:N/A] [24:N/A N/A] Debridement Treatment N/A [24:N/A N/A] Response: Post Debridement [17:N/A] [24:N/A N/A] Measurements L x W x D (cm) Post Debridement [17:N/A] [24:N/A N/A] Volume: (cm) Procedures Performed: N/A [24:N/A N/A] Treatment Notes Wound #17 (Right, Dorsal Toe - Web between 1st and 2nd) 1. Cleanse With Wound Cleanser Soap and water 2. Periwound Care Antifungal cream Barrier cream Moisturizing lotion TCA Cream 3. Primary Dressing Applied Iodoflex 4. Secondary Dressing Dry Gauze Roll Gauze 5. Secured With Medipore tape 6. Support Layer Applied Compression garment Notes heel cup to left heel. Wound #24 (Left, Dorsal Foot) 1. Cleanse With Wound Cleanser Soap and water 2. Periwound Care Antifungal cream Barrier cream Moisturizing lotion TCA Cream 3. Primary Dressing Applied Iodoflex 4. Secondary Dressing Dry Gauze Roll Gauze 5. Secured With Medipore tape 6. Support Layer Applied Compression garment Notes heel cup to left heel. Wound #35 (Left Ischium) 1. Cleanse  With Wound Cleanser Soap and water 3. Primary Dressing Applied Hydrofera Blue 4. Secondary Dressing Foam Border Dressing 5. Secured  With Self Adhesive Bandage Wound #36 (Left, Distal Ischium) 1. Cleanse With Wound Cleanser Soap and water 3. Primary Dressing Applied Hydrofera Blue 4. Secondary Dressing Foam Border Dressing 5. Secured With Office manager) Signed: 10/08/2019 5:46:17 PM By: Linton Ham MD Signed: 10/08/2019 5:59:11 PM By: Levan Hurst RN, BSN Entered By: Linton Ham on 10/08/2019 08:56:42 -------------------------------------------------------------------------------- Pain Assessment Details Patient Name: Date of Service: Badertscher, [name] 10/08/2019 7:30 AM Medical Record [id number] Patient Account Number: [id number] Date of Birth/Sex: Treating RN: 10/26/1986 (33 y.o. [name] Primary Care Jamilia Jacques: [place], [place] Other Clinician: Referring Lain Tetterton: Treating Talik Casique/Extender:Robson, [name], [name]: 196 Active Problems Location of Pain Severity and Description of Pain Patient Has Paino No Site Locations Pain Management and Medication Current Pain Management: Electronic Signature(s) Signed: 10/30/2019 2:50:47 PM By: Carlene Coria RN Entered By: Carlene Coria on 10/08/2019 07:52:32 -------------------------------------------------------------------------------- Wound Assessment Details Patient Name: Date of Service: Benninger, STRATTON JEANMARIE 10/08/2019 7:30 AM Medical Record [id number] Patient Account Number: [id number] Date of Birth/Sex: Treating RN: [birth year] (33 y.o. [name] Primary Care Syna Gad: [place], [place] Other Clinician: Referring Dany Walther: Treating Kemyra August/Extender:Robson, [name], [name]: 196 Wound Status Wound Number: 17 Primary Etiology: Atypical Wound Location: Right Toe - Web between 1st and Wound Status: Open 2nd - Dorsal Comorbid Sleep Apnea, Hypertension, Wounding Event: Gradually Appeared History: Paraplegia Date Acquired: 04/07/2017 Weeks Of Treatment:  130 Clustered Wound: Yes Photos Wound Measurements Length: (cm) 4.5 % Reduct Width: (cm) 2.7 % Reduct Depth: (cm) 0.1 Epitheli Clustered Quantity: 2 Tunnelin Area: (cm) 9.543 Undermi Volume: (cm) 0.954 Wound Description Full Thickness Without Exposed Support Foul Od Classification: Structures Slough/ Wound Distinct, outline attached Margin: Exudate Medium Amount: Exudate Serosanguineous Type: Exudate red, brown Color: Wound Bed Granulation Amount: Small (1-33%) Granulation Quality: Pale Fascia Necrotic Amount: Large (67-100%) Fat Lay Necrotic Quality: Adherent Slough Tendon Muscle Joint E Bone Ex or After Cleansing: No Fibrino Yes Exposed Structure Exposed: No er (Subcutaneous Tissue) Exposed: Yes Exposed: No Exposed: No xposed: No posed: No ion in Area: -1520.2% ion in Volume: -1516.9% alization: None g: No ning: No Electronic Signature(s) Signed: 10/09/2019 4:00:22 PM By: Mikeal Hawthorne EMT/HBOT Signed: 10/30/2019 2:50:47 PM By: Carlene Coria RN Entered By: Mikeal Hawthorne on 10/08/2019 13:55:54 -------------------------------------------------------------------------------- Wound Assessment Details Patient Name: Date of Service: [place], [name] 10/08/2019 7:30 AM Medical Record [id number] Patient Account Number: [id number] Date of Birth/Sex: Treating RN: [birth year] (33 y.o. [name] Primary Care Maia Handa: [place], [place] Other Clinician: Referring Zayaan Kozak: Treating Parley Pidcock/Extender:Robson, [name], [name]: 196 Wound Status Wound Number: 24 Primary Etiology: Inflammatory Wound Location: Left Foot - Dorsal Wound Status: Open Wounding Event: Gradually Appeared Comorbid Sleep Apnea, Hypertension, History: Paraplegia Date Acquired: 03/08/2018 Weeks Of Treatment: 82 Clustered Wound: No Wound Measurements Length: (cm) 2.5 % Reduct Width: (cm) 4.2 % Reduct Depth: (cm) 0.1 Epitheli Area: (cm) 8.247  Tunneli Volume: (cm) 0.825 Undermi Wound Description Classification: Full Thickness Without Exposed Support Foul Od Structures Slough/ Wound Distinct, outline attached Margin: Exudate Medium Amount: Exudate Serosanguineous Type: Exudate red, brown Color: Wound Bed Granulation Amount: Small (1-33%) Granulation Quality: Pale Fascia Necrotic Amount: Large (67-100%) Fat Lay Necrotic Quality: Adherent Slough Tendon Muscle Joint E Bone Ex Electronic Signature(s) Signed: 10/30/2019 2:50:47 PM By: Carlene Coria RN Entered By: Carlene Coria on 11/16/ or After Cleansing: No Fibrino Yes Exposed  Structure Exposed: No er (Subcutaneous Tissue) Exposed: Yes Exposed: No Exposed: No xposed: No posed: No 2020 08:08:48 ion in Area: -3394.5% ion in Volume: -3337.5% alization: None ng: No ning: No -------------------------------------------------------------------------------- Wound Assessment Details Patient Name: Date of Service: DEVONTEZ, ASCHER 10/08/2019 7:30 AM Medical Record [id number] Patient Account Number: [id number] Date of Birth/Sex: Treating RN: October 17, 1986 (33 y.o. [name]) Carlene Coria Primary Care Sangeeta Youse: [place], [place] Other Clinician: Referring Osinachi Navarrette: Treating Marylou Wages/Extender:Robson, [name], [name]: 196 Wound Status Wound Number: 35 Primary Etiology: Pressure Ulcer Wound Location: Left Ischium Wound Status: Open Wounding Event: Gradually Appeared Comorbid Sleep Apnea, Hypertension, History: Paraplegia Date Acquired: 04/28/2019 Weeks Of Treatment: 23 Clustered Wound: Yes Photos Wound Measurements Length: (cm) 3.5 Width: (cm) 1.5 Depth: (cm) 0.1 Clustered Quantity: 2 Area: (cm) 4.123 Volume: (cm) 0.412 Wound Description Classification: Category/Stage III Wound Margin: Flat and Intact Exudate Amount: Medium Exudate Type: Serosanguineous Exudate Color: red, brown Wound Bed Granulation Amount: Large  (67-100%) Granulation Quality: Red, Friable Necrotic Amount: Small (1-33%) Necrotic Quality: Adherent Slough After Cleansing: No brino Yes Exposed Structure osed: No (Subcutaneous Tissue) Exposed: Yes posed: No posed: No osed: No sed: No % Reduction in Area: 70.8% % Reduction in Volume: 70.9% Epithelialization: None Tunneling: No Undermining: No Foul Odor Slough/Fi Fascia Exp Fat Layer Tendon Ex Muscle Ex Joint Exp Bone Expo Electronic Signature(s) Signed: 10/09/2019 4:00:22 PM By: Mikeal Hawthorne EMT/HBOT Signed: 10/30/2019 2:50:47 PM By: Carlene Coria RN Entered By: Mikeal Hawthorne on 10/08/2019 14:06:25 -------------------------------------------------------------------------------- Wound Assessment Details Patient Name: Date of Service: [place]el, [name] 10/08/2019 7:30 AM Medical Record [id number] Patient Account Number: [id number] Date of Birth/Sex: Treating RN: [birth year] (33 y.o. [name] Primary Care Channin Agustin: [place], [place] Other Clinician: Referring Valita Righter: Treating Kambrie Eddleman/Extender:Robson, [name], [name]: 196 Wound Status Wound Number: 36 Primary Etiology: Pressure Ulcer Wound Location: Left Ischium - Distal Wound Status: Open Wounding Event: Gradually Appeared Comorbid Sleep Apnea, Hypertension, History: Paraplegia Date Acquired: 10/04/2019 Weeks Of Treatment: 0 Clustered Wound: No Photos Wound Measurements Length: (cm) 1 % Reducti Width: (cm) 0.4 % Reducti Depth: (cm) 0.1 Epithelia Area: (cm) 0.314 Tunnelin Volume: (cm) 0.031 Undermin Wound Description Classification: Category/Stage II Foul Odo Exudate Amount: Medium Slough/F Exudate Type: Serosanguineous Exudate Color: red, brown Wound Bed Granulation Amount: Large (67-100%) Granulation Quality: Red, Pink Fascia Expo Fat Layer ( Tendon Expo Muscle Expo Joint Expos Bone Expose r After Cleansing: No ibrino No Exposed Structure sed:  No Subcutaneous Tissue) Exposed: Yes sed: No sed: No ed: No d: No on in Area: 0% on in Volume: 0% lization: None g: No ing: No Electronic Signature(s) Signed: 10/09/2019 4:00:22 PM By: Mikeal Hawthorne EMT/HBOT Signed: 10/30/2019 2:50:47 PM By: Carlene Coria RN Entered By: Mikeal Hawthorne on 10/08/2019 14:03:39 -------------------------------------------------------------------------------- Vitals Details Patient Name: Date of Service: Baria, [name] 10/08/2019 7:30 AM Medical Record [id number] Patient Account Number: [id number] Date of Birth/Sex: Treating RN: 06-Oct-1986 (33 y.o. [name] Primary Care Posey Jasmin: [place], [place] Other Clinician: Referring Duvall Comes: Treating Loeta Herst/Extender:Robson, [name], [name]: 196 Vital Signs Time Taken: 07:51 Temperature (F): 98 Height (in): 70 Pulse (bpm): 115 Weight (lbs): 216 Respiratory Rate (breaths/min): 16 Body Mass Index (BMI): 31 Blood Pressure (mmHg): 123/67 Reference Range: 80 - 120 mg / dl Electronic Signature(s) Signed: 10/30/2019 2:50:47 PM By: Carlene Coria RN Entered By: Carlene Coria on 10/08/2019 07:52:14

## 2019-10-30 NOTE — Progress Notes (Addendum)
Munguia, STACI EICHENAUER ([id number]) Visit Report for 10/29/2019 Arrival Information Details Patient Name: Date of Service: PRUITT, ZARETSKY 10/29/2019 7:30 AM Medical Record [id number] Patient Account Number: [id number] Date of Birth/Sex: Treating RN: 09/06/1986 (33 y.o. [name] Primary Care Shuntavia Yerby: [name], [name] Other Clinician: Referring Asucena Galer: Treating Jessee Mezera/Extender:Robson, [name], [name]: 199 Visit Information History Since Last Visit Added or deleted any medications: No Patient Arrived: Wheel Chair Any new allergies or adverse reactions: No Arrival Time: 07:50 Had a fall or experienced change in No activities of daily living that may affect Accompanied By: self risk of falls: Transfer Assistance: None Signs or symptoms of abuse/neglect since last No Patient Identification Verified: Yes visito Secondary Verification Process Completed: Yes Hospitalized since last visit: No Patient Requires Transmission-Based No Implantable device outside of the clinic excluding No Precautions: cellular tissue based products placed in the center Patient Has Alerts: Yes since last visit: Patient Alerts: R ABI = Has Dressing in Place as Prescribed: Yes 1.0 Has Compression in Place as Prescribed: Yes L ABI = Pain Present Now: No 1.1 Electronic Signature(s) Signed: 10/29/2019 5:20:01 PM By: Deon Pilling Entered By: Deon Pilling on 10/29/2019 07:53:57 -------------------------------------------------------------------------------- Encounter Discharge Information Details Patient Name: Date of Service: Herber, [name] 10/29/2019 7:30 AM Medical Record [id number] Patient Account Number: [id number] Date of Birth/Sex: Treating RN: [birth year] (33 y.o. [name] Primary Care Cuauhtemoc Huegel: [name], [name] Other Clinician: Referring Hashem Goynes: Treating Iliany Losier/Extender:Robson, [name], [name]: 199 Encounter Discharge  Information Items Post Procedure Vitals Discharge Condition: Stable Temperature (F): 98.2 Ambulatory Status: Wheelchair Pulse (bpm): 115 Discharge Destination: Home Respiratory Rate (breaths/min): 20 Transportation: Private Auto Blood Pressure (mmHg): 129/72 Accompanied By: self Schedule Follow-up Appointment: Yes Clinical Summary of Care: Electronic Signature(s) Signed: 10/29/2019 5:20:01 PM By: Deon Pilling Entered By: Deon Pilling on 10/29/2019 08:39:49 -------------------------------------------------------------------------------- Lower Extremity Assessment Details Patient Name: Date of Service: CRIBBKenzi, Zambo 10/29/2019 7:30 AM Medical Record [id number] Patient Account Number: [id number] Date of Birth/Sex: Treating RN: [birth year] (33 y.o. [name] Primary Care Lougenia Morrissey: [name], [name] Other Clinician: Referring Raziel Koenigs: Treating Delylah Stanczyk/Extender:Robson, [name], [name]: 199 Edema Assessment Assessed: [Left: Yes] [Right: Yes] Edema: [Left: No] [Right: No] Calf Left: Right: Point of Measurement: 33 cm From Medial Instep 29.5 cm 33.5 cm Ankle Left: Right: Point of Measurement: 10 cm From Medial Instep 23.5 cm 24 cm Electronic Signature(s) Signed: 10/29/2019 5:20:01 PM By: Deon Pilling Entered By: Deon Pilling on 10/29/2019 07:55:18 -------------------------------------------------------------------------------- Multi Wound Chart Details Patient Name: Date of Service: [place], [name] 10/29/2019 7:30 AM Medical Record [id number] Patient Account Number: [id number] Date of Birth/Sex: Treating RN: 12/28/1985 (33 y.o. M) Primary Care Bessie Boyte: [name], [name] Other Clinician: Referring Reni Hausner: Treating Deloise Marchant/Extender:Robson, [name], [name]: 199 Vital Signs Height(in): 70 Pulse(bpm): 115 Weight(lbs): 216 Blood Pressure(mmHg): 129/72 Body Mass Index(BMI): 31 Temperature(F):  98.2 Respiratory 20 Rate(breaths/min): Photos: [17:No Photos] [24:No Photos] [35:No Photos] Wound Location: [17:Right Toe - Web between Left Foot - Dorsal 1st and 2nd - Dorsal] [35:Left Ischium] Wounding Event: [17:Gradually Appeared] [24:Gradually Appeared] [35:Gradually Appeared] Primary Etiology: [17:Atypical] [24:Inflammatory] [35:Pressure Ulcer] Comorbid History: [17:Sleep Apnea, Hypertension, Sleep Apnea, Hypertension, Sleep Apnea, Hypertension, Paraplegia] [24:Paraplegia] [35:Paraplegia] Date Acquired: [17:04/07/2017] [24:03/08/2018] [35:04/28/2019] Weeks of Treatment: [id number] [24:85] [35:26] Wound Status: [17:Open] [24:Open] [35:Open] Clustered Wound: [17:Yes] [24:No] [35:Yes] Clustered Quantity: [17:2] [24:N/A] [35:1] Measurements L x W x D 4.5x3x0.1 [24:2.8x2.6x0.3] [35:1x0.4x0.1] (cm) Area (cm) : [id number] [24:5.718] [35:0.314] Volume (cm) : [17:1.06] [24:1.715] [35:0.031] % Reduction  in Area: [17:-1700.20%] [24:-2322.90%] [35:97.80%] % Reduction in Volume: -1696.60% [24:-7045.80%] [35:97.80%] Classification: [17:Full Thickness Without Exposed Support Structures Exposed Support Structures] [24:Full Thickness Without] [35:Category/Stage III] Exudate Amount: [17:Medium] [24:Medium] [35:Medium] Exudate Type: [17:Serosanguineous] [24:Serosanguineous] [35:Serosanguineous] Exudate Color: [17:red, brown] [24:red, brown] [35:red, brown] Wound Margin: [17:Distinct, outline attached Distinct, outline attached Flat and Intact] Granulation Amount: [17:Medium (34-66%)] [24:Small (1-33%)] [35:Large (67-100%)] Granulation Quality: [17:Pale] [24:Pale] [35:Red, Friable] Necrotic Amount: [17:Medium (34-66%)] [24:Large (67-100%)] [35:None Present (0%)] Exposed Structures: [17:Fat Layer (Subcutaneous Fat Layer (Subcutaneous Fat Layer (Subcutaneous Tissue) Exposed: Yes Fascia: No Tendon: No Muscle: No Joint: No Bone: No] [24:Tissue) Exposed: Yes Fascia: No Tendon: No Muscle: No Joint: No Bone:  No] [35:Tissue) Exposed: Yes  Fascia: No Tendon: No Muscle: No Joint: No Bone: No] Epithelialization: [17:Small (1-33%)] [24:Small (1-33%)] [35:Medium (34-66%)] Debridement: [17:N/A] [24:Debridement - Excisional N/A] Pre-procedure [17:N/A] [24:08:17] [35:N/A] Verification/Time Out Taken: Tissue Debrided: [17:N/A] [24:Subcutaneous, Slough] [35:N/A] Level: [17:N/A] [24:Skin/Subcutaneous Tissue N/A] Debridement Area (sq cm):N/A [24:7.28] [35:N/A] Instrument: [17:N/A] [24:Curette] [35:N/A] Bleeding: [17:N/A] [24:Minimum] [35:N/A] Hemostasis Achieved: [17:N/A] [24:Pressure] [35:N/A] Procedural Pain: [17:N/A] [24:0] [35:N/A] Post Procedural Pain: [17:N/A] [24:0] [35:N/A] Debridement Treatment N/A [24:Procedure was tolerated] [35:N/A] Response: [24:well] Post Debridement [17:N/A] [24:2.8x2.6x0.3] [35:N/A] Measurements L x W x D (cm) Post Debridement [17:N/A] [24:1.715] [35:N/A] Volume: (cm) Procedures Performed: [17:N/A] [24:Biopsy Debridement] [35:N/A] Treatment Notes Wound #17 (Right, Dorsal Toe - Web between 1st and 2nd) 1. Cleanse With Wound Cleanser 2. Periwound Care Antifungal cream Moisturizing lotion 3. Primary Dressing Applied Calcium Alginate Ag 4. Secondary Dressing Dry Gauze Roll Gauze 5. Secured With Tape Wound #24 (Left, Dorsal Foot) 1. Cleanse With Wound Cleanser 2. Periwound Care Antifungal cream Moisturizing lotion 3. Primary Dressing Applied Calcium Alginate Ag 4. Secondary Dressing Dry Gauze Roll Gauze 5. Secured With Tape Wound #35 (Left Ischium) 1. Cleanse With Wound Cleanser 2. Periwound Care Barrier cream 3. Primary Dressing Applied Hydrofera Blue 4. Secondary Dressing Foam Border Dressing 5. Secured With Office manager) Signed: 10/30/2019 10:01:17 AM By: Linton Ham MD Entered By: Linton Ham on 10/29/2019  08:39:17 -------------------------------------------------------------------------------- Multi-Disciplinary Care Plan Details Patient Name: Date of Service: CRIBBMannix, Harkins 10/29/2019 7:30 AM Medical Record [id number] Patient Account Number: [id number] Date of Birth/Sex: Treating RN: 1985/12/25 (33 y.o. [name] Primary Care Stefon Ramthun: [name], [name] Other Clinician: Referring Mckenzie Toruno: Treating Braydin Aloi/Extender:Robson, [name], [name]: 199 Active Inactive Wound/Skin Impairment Nursing Diagnoses: Impaired tissue integrity Knowledge deficit related to ulceration/compromised skin integrity Goals: Patient/caregiver will verbalize understanding of skin care regimen Date Initiated: 01/05/2016 Target Resolution Date: 11/23/2019 Goal Status: Active Ulcer/skin breakdown will have a volume reduction of 30% by week 4 Date Initiated: 01/05/2016 Date Inactivated: 12/22/2017 Target Resolution Date: 01/19/2018 Unmet Reason: complex Goal Status: Unmet wounds, infection Interventions: Assess patient/caregiver ability to obtain necessary supplies Assess ulceration(s) every visit Provide education on ulcer and skin care Notes: Electronic Signature(s) Signed: 10/29/2019 5:51:44 PM By: Levan Hurst RN, BSN Entered By: Levan Hurst on 10/29/2019 08:35:31 -------------------------------------------------------------------------------- Pain Assessment Details Patient Name: Date of Service: Askren, [name] 10/29/2019 7:30 AM Medical Record [id number] Patient Account Number: [id number] Date of Birth/Sex: Treating RN: [birth year] (33 y.o. [name] Primary Care Callaghan Laverdure: [place], [address] [place] Other Clinician: Referring Sabrina Arriaga: Treating Yutaka Holberg/Extender:Robson, [name], [name]: 199 Active Problems Location of Pain Severity and Description of Pain Patient Has Paino No Site Locations Rate the pain. Current Pain Level: 0 Pain  Management and Medication Current Pain Management: Medication: No Cold Application: No Rest: No Massage: No  Activity: No T.E.N.S.: No Heat Application: No Leg drop or elevation: No Is the Current Pain Management Adequate: Adequate How does your wound impact your activities of daily livingo Sleep: No Bathing: No Appetite: No Relationship With Others: No Bladder Continence: No Emotions: No Bowel Continence: No Work: No Toileting: No Drive: No Dressing: No Hobbies: No Electronic Signature(s) Signed: 10/29/2019 5:20:01 PM By: Deon Pilling Entered By: Deon Pilling on 10/29/2019 07:54:30 -------------------------------------------------------------------------------- Patient/Caregiver Education Details Patient Name: Date of Service: [name] 12/7/2020andnbsp7:30 AM Medical Record [id number] Patient Account Number: [id number] Date of Birth/Gender: 07/03/1986 (33 y.o. M) Treating RN: Levan Hurst Primary Care Physician: Janine Limbo Other Clinician: Referring Physician: Treating Physician/Extender:Robson, Pecola Leisure in Treatment: 199 Education Assessment Education Provided To: Patient Education Topics Provided Wound/Skin Impairment: Methods: Explain/Verbal Responses: State content correctly Electronic Signature(s) Signed: 10/29/2019 5:51:44 PM By: Levan Hurst RN, BSN Entered By: Levan Hurst on 10/29/2019 08:35:45 -------------------------------------------------------------------------------- Wound Assessment Details Patient Name: Date of Service: Kibbe, [name] 10/29/2019 7:30 AM Medical Record [id number] Patient Account Number: [id number] Date of Birth/Sex: Treating RN: 18-Apr-1986 (33 y.o. [name] Primary Care Sila Sarsfield: [place], [place] Other Clinician: Referring Aerielle Stoklosa: Treating Dashan Chizmar/Extender:Robson, [name], [name]: 199 Wound Status Wound Number: 17 Primary Etiology:  Atypical Wound Location: Right Toe - Web between 1st and Wound Status: Open 2nd - Dorsal Comorbid Sleep Apnea, Hypertension, Wounding Event: Gradually Appeared History: Paraplegia Date Acquired: 04/07/2017 Weeks Of Treatment: 133 Clustered Wound: Yes Photos Wound Measurements Length: (cm) 4.5 % Reduct Width: (cm) 3 % Reduct Depth: (cm) 0.1 Epitheli Clustered Quantity: 2 Tunnelin Area: (cm) 10.603 Undermi Volume: (cm) 1.06 Wound Description Classification: Full Thickness Without Exposed Support Foul Od Structures Slough/ Wound Distinct, outline attached Margin: Exudate Medium Amount: Exudate Serosanguineous Type: Exudate red, brown Color: Wound Bed Granulation Amount: Medium (34-66%) Granulation Quality: Pale Fascia Ex Necrotic Amount: Medium (34-66%) Fat Layer Necrotic Quality: Adherent Slough Tendon Ex Muscle Ex Joint Exp Bone Expo or After Cleansing: No Fibrino Yes Exposed Structure posed: No (Subcutaneous Tissue) Exposed: Yes posed: No posed: No osed: No sed: No ion in Area: -1700.2% ion in Volume: -1696.6% alization: Small (1-33%) g: No ning: No Treatment Notes Wound #17 (Right, Dorsal Toe - Web between 1st and 2nd) 1. Cleanse With Wound Cleanser 2. Periwound Care Antifungal cream Moisturizing lotion 3. Primary Dressing Applied Calcium Alginate Ag 4. Secondary Dressing Dry Gauze Roll Gauze 5. Secured With Recruitment consultant) Signed: 10/30/2019 4:34:42 PM By: Mikeal Hawthorne EMT/HBOT Signed: 10/30/2019 5:32:49 PM By: Deon Pilling Previous Signature: 10/29/2019 5:20:01 PM Version By: Deon Pilling Entered By: Mikeal Hawthorne on 10/30/2019 14:36:38 -------------------------------------------------------------------------------- Wound Assessment Details Patient Name: Date of Service: Connaughton, Chane E. 10/29/2019 7:30 AM Medical Record [id number] Patient Account Number: [id number] Date of Birth/Sex: Treating RN: [birth year] (33 y.o.  [name] Primary Care Terrance Usery: [place], [place] Other Clinician: Referring Tyray Proch: Treating Cadince Hilscher/Extender:Robson, [name], [name]: 199 Wound Status Wound Number: 24 Primary Etiology: Inflammatory Wound Location: Left Foot - Dorsal Wound Status: Open Wounding Event: Gradually Appeared Comorbid Sleep Apnea, Hypertension, History: Paraplegia Date Acquired: 03/08/2018 Weeks Of Treatment: 85 Clustered Wound: No Photos Wound Measurements Length: (cm) 2.8 % Reduct Width: (cm) 2.6 % Reduct Depth: (cm) 0.3 Epitheli Area: (cm) 5.718 Tunneli Volume: (cm) 1.715 Undermi Wound Description Classification: Full Thickness Without Exposed Support Foul Odo Structures Slough/F Wound Distinct, outline attached Margin: Exudate Medium Amount: Exudate Serosanguineous Type: Exudate red, brown Color: Wound Bed Granulation Amount: Small (1-33%) Granulation Quality: Pale  Fascia E Necrotic Amount: Large (67-100%) Fat Laye Necrotic Quality: Adherent Slough Tendon E Muscle E Joint Ex Bone Exp r After Cleansing: No ibrino Yes Exposed Structure xposed: No r (Subcutaneous Tissue) Exposed: Yes xposed: No xposed: No posed: No osed: No ion in Area: -2322.9% ion in Volume: -7045.8% alization: Small (1-33%) ng: No ning: No Treatment Notes Wound #24 (Left, Dorsal Foot) 1. Cleanse With Wound Cleanser 2. Periwound Care Antifungal cream Moisturizing lotion 3. Primary Dressing Applied Calcium Alginate Ag 4. Secondary Dressing Dry Gauze Roll Gauze 5. Secured With Recruitment consultant) Signed: 10/30/2019 4:34:42 PM By: Mikeal Hawthorne EMT/HBOT Signed: 10/30/2019 5:32:49 PM By: Deon Pilling Previous Signature: 10/29/2019 5:20:01 PM Version By: Deon Pilling Entered By: Mikeal Hawthorne on 10/30/2019 14:40:33 -------------------------------------------------------------------------------- Wound Assessment Details Patient Name: Date of  Service: Causby, Lory E. 10/29/2019 7:30 AM Medical Record [id number] Patient Account Number: [id number] Date of Birth/Sex: Treating RN: 10-19-1986 (33 y.o. [name] Primary Care Myasia Sinatra: [place], [place] Other Clinician: Referring Traver Meckes: Treating Hayden Mabin/Extender:Robson, [name], [name]: 199 Wound Status Wound Number: 35 Primary Etiology: Pressure Ulcer Wound Location: Left Ischium Wound Status: Open Wounding Event: Gradually Appeared Comorbid Sleep Apnea, Hypertension, History: Paraplegia Date Acquired: 04/28/2019 Weeks Of Treatment: 26 Clustered Wound: Yes Photos Wound Measurements Length: (cm) 1 % Reducti Width: (cm) 0.4 % Reducti Depth: (cm) 0.1 Epithelia Clustered Quantity: 1 Area: (cm) 0.314 Volume: (cm) 0.031 Wound Description Classification: Category/Stage III Wound Margin: Flat and Intact Exudate Amount: Medium Exudate Type: Serosanguineous Exudate Color: red, brown Wound Bed Granulation Amount: Large (67-100%) Granulation Quality: Red, Friable Necrotic Amount: None Present (0%) Foul Odor After Cleansing: No Slough/Fibrino Yes Exposed Structure Fascia Exposed: No Fat Layer (Subcutaneous Tissue) Exposed: Yes Tendon Exposed: No Muscle Exposed: No Joint Exposed: No Bone Exposed: No on in Area: 97.8% on in Volume: 97.8% lization: Medium (34-66%) Treatment Notes Wound #35 (Left Ischium) 1. Cleanse With Wound Cleanser 2. Periwound Care Barrier cream 3. Primary Dressing Applied Hydrofera Blue 4. Secondary Dressing Foam Border Dressing 5. Secured With Office manager) Signed: 10/30/2019 4:34:42 PM By: Mikeal Hawthorne EMT/HBOT Signed: 10/30/2019 5:32:49 PM By: Deon Pilling Previous Signature: 10/29/2019 5:20:01 PM Version By: Deon Pilling Entered By: Mikeal Hawthorne on 10/30/2019 14:35:41 -------------------------------------------------------------------------------- Vitals  Details Patient Name: Date of Service: Pippenger, Arnez E. 10/29/2019 7:30 AM Medical Record [id number] Patient Account Number: [id number] Date of Birth/Sex: Treating RN: 1986/11/15 (33 y.o. [name] Primary Care Gaelen Brager: [name], [name] Other Clinician: Referring Nolie Bignell: Treating Pressley Barsky/Extender:Robson, [name], [name]: 199 Vital Signs Time Taken: 07:50 Temperature (F): 98.2 Height (in): 70 Pulse (bpm): 115 Weight (lbs): 216 Respiratory Rate (breaths/min): 20 Body Mass Index (BMI): 31 Blood Pressure (mmHg): 129/72 Reference Range: 80 - 120 mg / dl Electronic Signature(s) Signed: 10/29/2019 5:20:01 PM By: Deon Pilling Entered By: Deon Pilling on 10/29/2019 07:54:18

## 2019-10-30 NOTE — Progress Notes (Signed)
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 venous insufficiency wound. We have been using Hydrofera Blue. Somewhat smaller from the wound circumference. The surface of the wound remains a bit on it almost epithelialized sedation in appearance. I use an open curette today debridement in the surface of all of this especially the edges Small open wounds remaining on the dorsal right first and second toe interspace and the plantar left first second toe and her face on the left 06/09/17; wound on the left lateral leg continues to be smaller but very gradual and very dry surface using Hydrofera Blue 06/16/17 requires weekly debridements now on the left lateral leg although this continues to contract. I changed to silver collagen last week because of dryness of the wound bed. Using Iodoflex to the areas on his first and second toes/web space bilaterally 06/24/17; patient with history of paraplegia also chronic venous insufficiency with lymphedema. Has a very difficult wound on the left lateral leg. This has been gradually reducing in terms of with but comes in with a very dry adherent surface. High switch to silver collagen a week or so ago with hydrogel to keep the area moist. This is been refractory to  multiple dressing attempts. He also has areas in his first and second toes bilaterally in the anterior and posterior web space.  I had been using Iodoflex here after a prolonged course of silver alginate with ketoconazole was ineffective [question tinea pedis] 07/14/17; patient arrives today with a very difficult adherent material over his left lateral lower leg wound. He also has surrounding erythema and poorly controlled edema. He was switched his Santyl last visit which the nurses are applying once during his doctor visit and once on a nurse visit. He was also reduced to 2 layer compression I'm not exactly sure of the issue here. 07/21/17; better surface today after 1 week of Iodoflex. Significant cellulitis that we treated last week also better. [Doxycycline] 07/28/17 better surface today with now 2 weeks of Iodoflex. Significant cellulitis treated with doxycycline. He has now completed the doxycycline and he is back to his usual degree of chronic venous inflammation/stasis dermatitis. He reminds me he has had ablations surgery here 08/04/17; continued improvement with Iodoflex to the left lateral leg wound in terms of the surface of the wound although the dimensions are better. 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 Problematic areas on the plantar aspect of the first second toe web space on the left and the dorsal aspect of the first second toe web space on the right. At one point I felt these were probably related to chronic fungal infections in treated him aggressively for this although we have not made any improvement here. 08/11/17; left lateral leg. Surface continues to improve with the Iodoflex although we are not seeing much improvement in overall wound dimensions. Areas on his plantar left foot and right foot show no improvement. In fact the right foot looks somewhat worse 08/18/17; left lateral leg. We changed to  [facility] Blue last week after a prolonged course of Iodoflex which helps get the surface better. It appears that the wound with is improved. Continue with difficult areas on the left dorsal first second and plantar first second on the right 09/01/17; patient arrives in clinic today having had a temperature of 103 yesterday. He was seen in the ER and [facility]. The patient was concerned he could have cellulitis again in the right leg however they diagnosed him with a UTI and he is now on Keflex. He has a history of cellulitis which is been recurrent and difficult but this is been in the left leg, in the past 5 use doxycycline. He does in and out catheterizations at home which are risk factors for UTI 09/08/17; patient will be completing his Keflex this weekend. The erythema on the left leg is considerably better. He has a new wound today on the medial part of the right leg small superficial almost looks like a skin tear. He has worsening of the area on the right dorsal first and second toe. His major area on the left lateral leg is better. Using Hydrofera Blue on all areas 09/15/17; gradual reduction in width on the long wound in the left lateral leg. No debridement required. He also has wounds on the plantar aspect of his left first second toe web space and on the dorsal aspect of the right first second toe web space. 09/22/17; there continues to be very gradual improvements in the dimensions of the left lateral leg wound. He hasn't round erythematous spot with might be pressure on his wheelchair. There is no evidence obviously of infection no purulence no warmth He has a dry scaled area on the plantar aspect of the left first second toe Improved area on the dorsal  right first second toe. 09/29/17; left lateral leg wound continues to improve in dimensions mostly with an is still a fairly long but increasingly narrow wound. He has a dry scaled area on the plantar aspect of his left first second  toe web space Increasingly concerning area on the dorsal right first second toe. In fact I am concerned today about possible cellulitis around this wound. The areas extending up his second toe and although there is deformities here almost appears to abut on the nailbed. 10/06/17; left lateral leg wound continues to make very gradual progress. Tissue culture I did from the right first second toe dorsal foot last time grew MRSA and enterococcus which was vancomycin sensitive. This was not sensitive to clindamycin or doxycycline. He is allergic to Zyvox and sulfa we have therefore arrange for him to have dalvance infusion tomorrow. He is had this in the past and tolerated it well 10/20/17; left lateral leg wound continues to make decent progress. This is certainly reduced in terms of with there is advancing epithelialization.The cellulitis in the right foot looks better although he still has a deep wound in the dorsal aspect of the first second toe web space. Plantar left first toe web space on the left I think is making some progress 10/27/17; left lateral leg wound continues to make decent progress. Advancing epithelialization.using Hydrofera Blue The right first second toe web space wound is better-looking using silver alginate Improvement in the left plantar first second toe web space. Again using silver alginate 11/03/17 left lateral leg wound continues to make decent progress albeit slowly. Using [facility] The right per second toe web space continues to be a very problematic looking punched out wound. I obtained a piece of tissue for deep culture I did extensively treated this for fungus. It is difficult to imagine that this is a pressure area as the patient states other than going outside he doesn't really wear shoes at home The left plantar first second toe web space looked fairly senescent. Necrotic edges. This required debridement change to [facility] Blue to all wound areas 11/10/17;  left lateral leg wound continues to contract. Using Hydrofera Blue On the right dorsal first second toe web space dorsally. Culture I did of this area last week grew MRSA there is not an easy oral option in this patient was multiple antibiotic allergies or intolerances. This was only a rare culture isolate I'm therefore going to use Bactroban under silver alginate On the left plantar first second toe web space. Debridement is required here. This is also unchanged 11/17/17; left lateral leg wound continues to contract using Hydrofera Blue this is no longer the major issue. The major concern here is the right first second toe web space. He now has an open area going from dorsally to the plantar aspect. There is now wound on the inner lateral part of the first toe. Not a very viable surface on this. There is erythema spreading medially into the forefoot. No major change in the left first second toe plantar wound 11/24/17; left lateral leg wound continues to contract using Hydrofera Blue. Nice improvement today The right first second toe web space all of this looks a lot less angry than last week. I have given him clindamycin and topical Bactroban for MRSA and terbinafine for the possibility of underlining tinea pedis that I could not control with ketoconazole. Looks somewhat better The area on the plantar left first second toe web space is weeping with dried debris around the  wound 12/01/17; left lateral leg wound continues to contract he Hydrofera Blue. It is becoming thinner in terms of with nevertheless it is making good improvement. The right first second toe web space looks less angry but still a large necrotic-looking wounds starting on the plantar aspect of the right foot extending between the toes and now extensively on the base of the right second toe. I gave him clindamycin and topical Bactroban for MRSA anterior benefiting for the possibility of underlying tinea pedis. Not looking better  today The area on the left first/second toe looks better. Debrided of necrotic debris 12/05/17* the patient was worked in urgently today because over the weekend he found blood on his incontinence bad when he woke up. He was found to have an ulcer by his wife who does most of his wound care. He came in today for [place] to look at this. He has not had a history of wounds in his buttocks in spite of his paraplegia. 12/08/17; seen in follow-up today at his usual appointment. He was seen earlier this week and found to have a new wound on his buttock. We also follow him for wounds on the left lateral leg, left first second toe web space and right first second toe web space 12/15/17; we have been using Hydrofera Blue to the left lateral leg which has improved. The right first second toe web space has also improved. Left first second toe web space plantar aspect looks stable. The left buttock has worsened using Santyl. Apparently the buttock has drainage 12/22/17; we have been using Hydrofera Blue to the left lateral leg which continues to improve now 2 small wounds separated by normal skin. He tells [place] he had a fever up to 100 yesterday he is prone to UTIs but has not noted anything different. He does in and out catheterizations. The area between the first and second toes today does not look good necrotic surface covered with what looks to be purulent drainage and erythema extending into the third toe. I had gotten this to something that I thought look better last time however it is not look good today. He also has a necrotic surface over the buttock wound which is expanded. I thought there might be infection under here so I removed a lot of the surface with a #5 curet though nothing look like it really needed culturing. He is been using Santyl to this area 12/27/17; his original wound on the left lateral leg continues to improve using Hydrofera Blue. I gave him samples of Baxdella although he was unable to  take them out of fear for an allergic reaction ["lump in his throat"].the culture I did of the purulent drainage from his second toe last week showed both enterococcus and a set Enterobacter I was also concerned about the erythema on the bottom of his foot although paradoxically although this looks somewhat better today. 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 We've been using silver alginate here. They came up today that he sleeps in a recliner, I'm not sure why but I asked him to stop this 01/03/18; his original wound we've been using Hydrofera Blue is now separated into 2 areas. Ulcer on his left buttock is better he is off the recliner and sleeping in bed Finally both wound areas between his first and second toes also looks some better 01/10/18; his original wound on the left lateral leg is  now separated into 2 wounds we've been using Hydrofera Blue Ulcer on his left buttock has some drainage. There is a small probing site going into muscle layer superiorly.using silver alginate -He arrives today with a deep tissue injury on the left heel The wound on the dorsal aspect of his first second toe on the left looks a lot betterusing silver alginate ketoconazole The area on the first second toe web space on the right also looks a lot bette 01/17/18; his original wound on the left lateral leg continues to progress using Hydrofera Blue Ulcer on his left buttock also is smaller surface healthier except for a small probing site going into the muscle layer superiorly. 2.4 cm of tunneling in this area DTI on his left heel we have only been offloading. Looks better than last week no threatened open no evidence of infection the wound on the dorsal aspect of the first second toe on the left continues to look like it's regressing we have only been using silver alginate and terbinafine orally The area in the  first second toe web space on the right also looks to be a lot better using silver alginate and terbinafine I think this was prompted by tinea pedis 01/31/18; the patient was hospitalized in [place] last week apparently for a complicated UTI. He was discharged on cefepime he does in and out catheterizations. In the hospital he was discovered M I don't mild elevation of AST and ALTs and the terbinafine was stopped.predictably the pressure ulcer on his buttock looks betterusing silver alginate. The area on the left lateral leg also is better using Hydrofera Blue. The area between the first and second toes on the left better. First and second toes on the right still substantial but better. Finally the DTI on the left heel has held together and looks like it's resolving 02/07/18-he is here in follow-up evaluation for multiple ulcerations. He has new injury to the lateral aspect of the last issue a pressure ulcer, he states this is from adhesive removal trauma. He states he has tried multiple adhesive products with no success. All other ulcers appear stable. The left heel DTI is resolving. We will continue with same treatment plan and follow-up next week. 02/14/18; follow-up for multiple areas. He has a new area last week on the lateral aspect of his pressure ulcer more over the posterior trochanter. The original pressure ulcer looks quite stable has healthy granulation. 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 Almost fully epithelialized on the original superior area using Hydrofera Blue DTI on the left heel has peeled off this week to reveal a small superficial wound under denuded skin and subcutaneous tissue Both areas between the first and second toes look better including nothing open on the left 02/21/18; The patient's wounds on his left ischial tuberosity and posterior left greater trochanter  actually looked better. He has a large area of irritation around the area which I think is contact dermatitis. I am doubtful that this is fungal His original wound on the left lateral calf continues to improve we have been using Hydrofera Blue There is no open area in the left first second toe web space although there is a lot of thick callus The DTI on the left heel required debridement today of necrotic surface eschar and subcutaneous tissue using silver alginate Finally the area on the right first second toe webspace continues to contract using  silver alginate and ketoconazole 02/28/18 Left ischial tuberosity wounds look better using silver alginate. Original wound on the left calf only has one small open area left using Hydrofera Blue DTI on the left heel required debridement mostly removing skin from around this wound surface. Using silver alginate The areas on the right first/second toe web space using silver alginate and ketoconazole 03/08/18 on evaluation today patient appears to be doing decently well as best I can tell in regard to his wounds. This is the first time that I have seen him as he generally is followed by Dr. Dellia Nims. With that being said none of his wounds appear to be infected he does have an area where there is some skin covering what appears to be a new wound on the left dorsal surface of his great toe. This is right at the nail bed. With that being said I do believe that debrided away some of the excess skin can be of benefit in this regard. Otherwise he has been tolerating the dressing changes without complication. 03/14/18; patient arrives today with the multiplicity of wounds that we are following. He has not been systemically unwell Original wound on the left lateral calf now only has 2 small open areas we've been using Hydrofera Blue which should continue The deep tissue injury on the left heel requires debridement today. We've been using silver alginate The left  first second toe and the right first second toe are both are reminiscence what I think was tinea pedis. Apparently some of the callus Surface between the toes was removed last week when it started draining. Purulent drainage coming from the wound on the ischial tuberosity on the left. 03/21/18-He is here in follow-up evaluation for multiple wounds. 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 He tolerated debridement. The only change to last week's recommendations is to discontinue antifungal cream between toes. He will follow-up next week 03/28/18; following up for multiple wounds;Concern this week is streaking redness and swelling in the right foot. He is going to need antibiotics for this. 03/31/18; follow-up for right foot cellulitis. Streaking redness and swelling in the right foot on 03/28/18. 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 I put him on clindamycin 300 mg every 6 and brought him in for a quick check. He has an open wound between his first and second toes on the right foot as a potential source. 04/04/18; Right foot cellulitis is resolving he is completing clindamycin. This is truly good news Left lateral calf wound which is initial wound only has one small open area inferiorly this is close to healing out. He has compression stockings. We will use Hydrofera Blue right down to the epithelialization of this Nonviable surface on the left heel which was initially pressure with a DTI. We've been using Hydrofera Blue. I'm going to switch this back to silver alginate Left first second toe/tinea pedis this looks better using silver alginate Right first second toe tinea pedis using silver alginate Large pressure ulcers on theLeft ischial tuberosity. Small wound here Looks better. I am uncertain about the surface over the large wound. Using silver alginate 04/11/18; Cellulitis in the right foot is  resolved Left lateral calf wound which was his original wounds still has 2 tiny open areas remaining this is just about closed Nonviable surface on the left heel is better but still requires debridement Left first second toe/tinea pedis still open using silver alginate Right first  second toe wound tinea pedis I asked him to go back to using ketoconazole and silver alginate Large pressure ulcers on the left ischial tuberosity this shear injury here is resolved. Wound is smaller. No evidence of infection using silver alginate 04/18/18; Patient arrives with an intense area of cellulitis in the right mid lower calf extending into the right heel area. Bright red and warm. Smaller area on the left anterior leg. He has a significant history of MRSA. He will definitely need antibioticsdoxycycline He now has 2 open areas on the left ischial tuberosity the original large wound and now a satellite area which I think was above his initial satellite areas. Not a wonderful surface on this satellite area surrounding erythema which looks like pressure related. His left lateral calf wound again his original wound is just about closed Left heel pressure injury still requiring debridement Left first second toe looks a lot better using silver alginate Right first second toe also using silver alginate and ketoconazole cream also looks better 04/20/18; the patient was worked in early today out of concerns with his cellulitis on the right leg. I had started him on doxycycline. This was 2 days ago. His wife was concerned about the swelling in the area. Also concerned about the left buttock. He has not been systemically unwell no fever chills. No nausea vomiting or diarrhea 04/25/18; the patient's left buttock wound is continued to deteriorate he is using Hydrofera Blue. He is still completing clindamycin for the cellulitis on the right leg although all of this looks better. 05/02/18 Left buttock wound still with a lot  of drainage and a very tightly adherent fibrinous necrotic surface. He has a deeper area superiorly The left lateral calf wound is still closed DTI wound on the left heel necrotic surface especially the circumference using Iodoflex Areas between his left first second toe and right first second toe both look better. Dorsally and the right first second toe he had a necrotic surface although at smaller. In using silver alginate and ketoconazole. I did a culture last week which was a deep tissue culture of the reminiscence of the open wound on the right first second toe dorsally. This grew a few Acinetobacter and a few methicillin-resistant staph aureus. Nevertheless the area actually this week looked better. I didn't feel the need to specifically address this at least in terms of systemic antibiotics. 05/09/18; wounds are measuring larger more drainage per our intake. We are using Santyl covered with alginate on the large superficial buttock wounds, Iodosorb on the left heel, ketoconazole and silver alginate to the dorsal first and second toes bilaterally. 05/16/18; The area on his left buttock better in some aspects although the area superiorly over the ischial tuberosity required an extensive debridement.using Santyl Left heel appears stable. Using Iodoflex The areas between his first and second toes are not bad however there is spreading erythema up the dorsal aspect of his left foot this looks like cellulitis again. He is insensate the erythema is really very brilliant.o Erysipelas He went to see an allergist days ago because he was itching part of this he had lab work done. This showed a white count of 15.1 with 70% neutrophils. Hemoglobin of 11.4 and a platelet count of 659,000. Last white count we had in Epic was a 2-1/2 years ago which was 25.9 but he was ill at the time. He was able to show me some lab work that was done by his primary physician the pattern is about the  same. 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 But prompted me to go ahead and do x-rays of both feet and the pelvis rule out osteomyelitis. He also had a comprehensive metabolic panel this was reasonably normal his albumin was 3.7 liver function tests BUN/creatinine all normal 05/23/18; x-rays of both his feet from last week were negative for underlying pulmonary abnormality. The x-ray of his pelvis however showed mild irregularity in the left ischial which may represent some early osteomyelitis. The wound in the left ischial continues to get deeper clearly now exposed muscle. Each week necrotic surface material over this area. Whereas the rest of the wounds do not look so bad. The left ischial wound we have been using Santyl and calcium alginate To the left heel surface necrotic debris using Iodoflex The left lateral leg is still healed Areas on the left dorsal foot and the right dorsal foot are about the same. There is some inflammation on the left which might represent contact dermatitis, fungal dermatitis I am doubtful cellulitis although this looks better than last week 05/30/18; CT scan done at Hospital did not show any osteomyelitis or abscess. Suggested the possibility of underlying cellulitis although I don't see a lot of evidence of this at the bedside The wound itself on the left buttock/upper thigh actually looks somewhat better. No debridement Left heel also looks better no debridement continue Iodoflex Both dorsal first second toe spaces appear better using Lotrisone. Left still required debridement 06/06/18; Intake reported some purulent looking drainage from the left gluteal wound. Using Santyl and calcium alginate Left heel looks better although still a nonviable surface requiring debridement The left dorsal foot first/second webspace actually expanding and somewhat deeper. I may consider doing a shave biopsy of this area Right dorsal foot first/second  webspace appears stable to improved. Using Lotrisone and silver alginate to both these areas 06/13/18 Left gluteal surface looks better. Now separated in the 2 wounds. No debridement required. Still drainage. We'll continue silver alginate Left heel continues to look better with Iodoflex continue this for at least another week Of his dorsal foot wounds the area on the left still has some depth although it looks better than last week. We've been using Lotrisone and silver alginate 06/20/18 Left gluteal continues to look better healthy tissue Left heel continues to look better healthy granulation wound is smaller. He is using Iodoflex and his long as this continues continue the Iodoflex Dorsal right foot looks better unfortunately dorsal left foot does not. There is swelling and erythema of his forefoot. He had minor trauma to this several days ago but doesn't think this was enough to have caused any tissue injury. Foot looks like cellulitis, we have had this problem before 06/27/18 on evaluation today patient appears to be doing a little worse in regard to his foot ulcer. 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 Both of which would really be his only options for treating this infection. In the past he has been given and effusion of Orbactiv. This is done very well for him in the past again it's one time dosing IV antibiotic therapy. Subsequently I do believe this is something we're gonna need to see about doing at this point in time. Currently his other wounds seem to be doing somewhat better in my pinion I'm  pretty happy in that regard. 07/03/18 on evaluation today patient's wounds actually appear to be doing fairly well. He has been tolerating the dressing changes without complication. All in all he seems to be showing signs of improvement. In regard to  the antibiotics he has been dealing with infectious disease since I saw him last week as far as getting this scheduled. In the end [facility]'s [facility] this coming Friday. In the meantime he has been continuing to perform the dressing changes in such as previous. There does not appear to be any evidence of infection worsengin at this time. 07/10/18; Since I last saw this man 2 weeks ago things have actually improved. IV antibiotics of resulted in less forefoot erythema although there is still some present. He is not systemically unwell Left buttock wounds 2 now have no depth there is increased epithelialization Using silver alginate Left heel still requires debridement using Iodoflex Left dorsal foot still with a sizable wound about the size of a border but healthy granulation Right dorsal foot still with a slitlike area using silver alginate 07/18/18; the patient's cellulitis in the left foot is improved in fact I think it is on its way to resolving. Left buttock wounds 2 both look better although the larger one has hypertension granulation we've been using silver alginate Left heel has some thick circumferential redundant skin over the wound edge which will need to be removed today we've been using Iodoflex Left dorsal foot is still a sizable wound required debridement using silver alginate The right dorsal foot is just about closed only a small open area remains here 07/25/18; left foot cellulitis is resolved Left buttock wounds 2 both look better. Hyper-granulation on the major area Left heel as some debris over the surface but otherwise looks a healthier wound. Using silver collagen Right dorsal foot is just about closed 07/31/18; arrives with our intake nurse worried about purulent drainage from the buttock. We had hyper-granulation here last week His buttock wounds 2 continue to look better Left heel some debris over the surface but  measuring smaller. Right dorsal foot unfortunately has openings between the toes Left foot superficial wound looks less aggravated. 08/07/18 Buttock wounds continue to look better although some of her granulation and the larger medial wound. silver alginate Left heel continues to look a lot better.silver collagen Left foot superficial wound looks less stable. Requires debridement. He has a new wound superficial area on the foot on the lateral dorsal foot. Right foot looks better using silver alginate without Lotrisone 08/14/2018; patient was in the ER last week diagnosed with a UTI. He is now on Cefpodoxime and Macrodantin. Buttock wounds continued to be smaller. Using silver alginate Left heel continues to look better using silver collagen Left foot superficial wound looks as though it is improving Right dorsal foot area is just about healed. 08/21/2018; patient is completed his antibiotics for his UTI. He has 2 open areas on the buttocks. There is still not closed although the surface looks satisfactory. Using silver alginate Left heel continues to improve using silver collagen The bilateral dorsal foot areas which are at the base of his first and second toes/possible tinea pedis are actually stable on the left but worse on the right. The area on the left required debridement of necrotic surface. After debridement I obtained a specimen for PCR culture. The right dorsal foot which is been just about healed last week is now  reopened 08/28/2018; culture done on the left dorsal foot showed coag negative staph both staph epidermidis and Lugdunensis. I think this is worthwhile initiating systemic treatment. I will use doxycycline given his long list of allergies. The area on the left heel slightly improved but still requiring debridement. The large wound on the buttock is just about closed whereas the smaller one is larger. Using silver alginate in this area 09/04/2018; patient is completing his  doxycycline for the left foot although this continues to be a very difficult wound area with very adherent necrotic debris. We are using silver alginate to all his wounds right foot left foot and the small wounds on his buttock, silver collagen on the left heel. 09/11/2018; once again this patient has intense erythema and swelling of the left forefoot. Lesser degrees of erythema in the right foot. He has a long list of allergies and intolerances. I will reinstitute doxycycline. 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 Using silver alginate Left heel also looks better using silver collagen Unfortunately both the areas on his feet look worse. The area on the left first second webspace is now gone through to the plantar part of his foot. The area on the left foot anteriorly is irritated with erythema and swelling in the forefoot. 09/25/2018 His wound on the left plantar heel looks better. Using silver collagen The area on the left buttock 2 small remnant areas. One is closed one is still open. Using silver alginate The areas between both his first and second toes look worse. This in spite of long-standing antifungal therapy with ketoconazole and silver alginate which should have antifungal activity He has small areas around his original wound on the left calf one is on the bottom of the original scar tissue and one superiorly both of these are small and superficial but again given wound history in this site this is worrisome 10/02/2018 Left plantar heel continues to gradually contract using silver collagen Left buttock wound is unchanged using silver alginate The areas on his dorsal feet between his first and second toes bilaterally look about the same. I prescribed clindamycin ointment to see if we can address chronic staph colonization and also the underlying possibility of erythrasma The left lateral lower extremity wound is actually on the  lateral part of his ankle. Small open area here. We have been using silver alginate 10/09/2018; Left plantar heel continues to look healthy and contract. No debridement is required Left buttock slightly smaller with a tape injury wound just below which was new this week Dorsal feet somewhat improved I have been using clindamycin Left lateral looks lower extremity the actual open area looks worse although a lot of this is epithelialized. I am going to change to silver collagen today He has a lot more swelling in the right leg although this is not pitting not red and not particularly warm there is a lot of spasm in the right leg usually indicative of people with paralysis of some underlying discomfort. We have reviewed his vascular status from 2017 he had a left greater saphenous vein ablation. I wonder about referring him back to vascular surgery if the area on the left leg continues to deteriorate. 10/16/2018 in today for follow-up and management of multiple lower extremity ulcers. His left Buttock wound is much lower smaller and almost closed completely. The wound to the left ankle has began to reopen with Epithelialization and some adherent slough. He has  multiple new areas to the left foot and leg. The left dorsal foot without much improvement. Wound present between left great webspace and 2nd toe. Erythema and edema present right leg. Right LE ultrasound obtained on 10/10/18 was negative for DVT. 10/23/2018; Left buttock is closed over. Still dry macerated skin but there is no open wound. I suspect this is chronic pressure/moisture Left lateral calf is quite a bit worse than when I saw this last. There is clearly drainage here he has macerated skin into the left plantar heel. We will change the primary dressing to alginate Left dorsal foot has some improvement in overall wound area. Still using clindamycin and silver alginate Right dorsal foot about the same as the left using clindamycin  and silver alginate The erythema in the right leg has resolved. He is DVT rule out was negative Left heel pressure area required debridement although the wound is smaller and the surface is health 10/26/2018 The patient came back in for his nurse check today predominantly because of the drainage coming out of the left lateral leg with a recent reopening of his original wound on the left lateral calf. He comes in today with a large amount of surrounding erythema around the wound extending from the calf into the ankle and even in the area on the dorsal foot. He is not systemically unwell. He is not febrile. Nevertheless this looks like cellulitis. We have been using silver alginate to the area. I changed him to a regular visit and I am going to prescribe him doxycycline. The rationale here is a long list of medication intolerances and a history of MRSA. I did not see anything that I thought would provide a valuable culture 10/30/2018 Follow-up from his appointment 4 days ago with really an extensive area of cellulitis in the left calf left lateral ankle and left dorsal foot. I put him on doxycycline. He has a long list of medication allergies which are true allergy reactions. 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 In any case he is a lot better today. The erythema especially in the anterior and lateral left calf is better. He still has left ankle erythema. He also is complaining about increasing edema in the right leg we have only been using Kerlix Coban and he has been doing the wraps at home. Finally he has a spotty rash on the medial part of his upper left calf which looks like folliculitis or perhaps wrap occlusion type injury. Small superficial macules not pustules 11/06/18 patient arrives today with again a considerable degree of erythema around the wound on the left lateral calf extending into the dorsal ankle and dorsal  foot. This is a lot worse than when I saw this last week. He is on doxycycline really with not a lot of improvement. He has not been systemically unwell Wounds on the; left heel actually looks improved. Original area on the left foot and proximity to the first and second toes looks about the same. He has superficial areas on the dorsal foot, anterior calf and then the reopening of his original wound on the left lateral calf which looks about the same The only area he has on the right is the dorsal webspace first and second which is smaller. He has a large area of dry erythematous skin on the left buttock small open area here. 11/13/2018; the patient arrives in much better condition. The erythema around the wound on the  left lateral calf is a lot better. Not sure whether this was the clindamycin or the TCA and ketoconazole or just in the improvement in edema control [stasis dermatitis]. In any case this is a lot better. The area on the left heel is very small and just about resolved using silver collagen we have been using silver alginate to the areas on his dorsal feet 11/20/2018; his wounds include the left lateral calf, left heel, dorsal aspects of both feet just proximal to the first second webspace. He is stable to slightly improved. I did not think any changes to his dressings were going to be necessary 11/27/2018 he has a reopening on the left buttock which is surrounded by what looks like tinea or perhaps some other form of dermatitis. The area on the left dorsal foot has some erythema around it I have marked this area but I am not sure whether this is cellulitis or not. Left heel is not closed. Left calf the reopening is really slightly longer and probably worse 1/13; in general things look better and smaller except for the left dorsal foot. Area on the left heel is just about closed, left buttock looks better only a small wound remains in the skin looks better [using Lotrisone] 1/20; the  area on the left heel only has a few remaining open areas here. Left lateral calf about the same in terms of size, left dorsal foot slightly larger right lateral foot still not closed. The area on the left buttock has no open wound and the surrounding skin looks a lot better 1/27; the area on the left heel is closed. Left lateral calf better but still requiring extensive debridements. The area on his left buttock is closed. He still has the open areas on the left dorsal foot which is slightly smaller in the right foot which is slightly expanded. We have been using Iodoflex on these areas as well 2/3; left heel is closed. Left lateral calf still requiring debridement using Iodoflex there is no open area on his left buttock however he has dry scaly skin over a large area of this. Not really responding well to the Lotrisone. Finally the areas on his dorsal feet at the level of the first second webspace are slightly smaller on the right and about the same on the left. Both of these vigorously debrided with Anasept and gauze 2/10; left heel remains closed he has dry erythematous skin over the left buttock but there is no open wound here. Left lateral leg has come in and with. Still requiring debridement we have been using Iodoflex here. Finally the area on the left dorsal foot and right dorsal foot are really about the same extremely dry callused fissured areas. He does not yet have a dermatology appointment 2/17; left heel remains closed. He has a new open area on the left buttock. The area on the left lateral calf is bigger longer and still covered in necrotic debris. No major change in his foot areas bilaterally. I am awaiting for a dermatologist to look on this. We have been using ketoconazole I do not know that this is been doing any good at all. 2/24; left heel remains closed. The left buttock wound that was new reopening last week looks better. The left lateral calf appears better also although  still requires debridement. The major area on his foot is the left first second also requiring debridement. We have been putting Prisma on all wounds. I do not believe that the ketoconazole  has done too much good for his feet. He will use Lotrisone I am going to give him a 2-week course of terbinafine. We still do not have a dermatology appointment 3/2 left heel remains closed however there is skin over bone in this area I pointed this out to him today. The left buttock wound is epithelialized but still does not look completely stable. The area on the left leg required debridement were using silver collagen here. With regards to his feet we changed to Lotrisone last week and silver alginate. 3/9; left heel remains closed. Left buttock remains closed. The area on the right foot is essentially closed. The left foot remains unchanged. Slightly smaller on the left lateral calf. Using silver collagen to both of these areas 3/16-Left heel remains closed. Area on right foot is closed. Left lateral calf above the lateral malleolus open wound requiring debridement with easy bleeding. Left dorsal wound proximal to first toe also debrided. Left ischial area open new. Patient has been using Prisma with wrapping every 3 days. Dermatology appointment is apparently tomorrow.Patient has completed his terbinafine 2-week course with some apparent improvement according to him, there is still flaking and dry skin in his foot on the left 3/23; area on the right foot is reopened. The area on the left anterior foot is about the same still a very necrotic adherent surface. He still has the area on the left leg and reopening is on the left buttock. He apparently saw dermatology although I do not have a note. According to the patient who is usually fairly well informed they did not have any good ideas. Put him on oral terbinafine which she is been on before. 3/30; using silver collagen to all wounds. Apparently his  dermatologist put him on doxycycline and rifampin presumably some culture grew staph. I do not have this result. He remains on terbinafine although I have used terbinafine on him before 4/6; patient has had a fairly substantial reopening on the right foot between the first and second toes. He is finished his terbinafine and I believe is on doxycycline and rifampin still as prescribed by dermatology. We have been using silver collagen to all his wounds although the patient reports that he thinks silver alginate does better on the wounds on his buttock. 4/13; the area on his left lateral calf about the same size but it did not require debridement. Left dorsal foot just proximal to the webspace between the first and second toes is about the same. Still nonviable surface. I note some superficial bronze discoloration of the dorsal part of his foot Right dorsal foot just proximal to the first and second toes also looks about the same. I still think there may be the same discoloration I noted above on the left Left buttock wound looks about the same 4/20; left lateral calf appears to be gradually contracting using silver collagen. 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 The left dorsal foot wound is debrided of tightly adherent necrotic debris and really cleans up quite nicely. The right area is worse with expansion. I did not debride this it is now over the base of the second toe The area on his left buttock is smaller no debridement is required using silver collagen 5/4; left calf continues to make good progress. He arrives with erythema around the wounds on his dorsal foot which even extends to the plantar aspect. Very concerning for coexistent infection. He is finished the erythromycin I gave  him for possible erythrasma this does not seem to have helped. The area on the left foot is about the same base of the dorsal toes Is area on the  buttock looks improved on the left 5/11; left calf and left buttock continued to make good progress. Left foot is about the same to slightly improved. Major problem is on the right foot. He has not had an x-ray. Deep tissue culture I did last week showed both Enterobacter and E. coli. I did not change the doxycycline I put him on empirically although neither 1 of these were plated to doxycycline. He arrives today with the erythema looking worse on both the dorsal and plantar foot. Macerated skin on the bottom of the foot. he has not been systemically unwell 5/18-Patient returns at 1 week, left calf wound appears to be making some progress, left buttock wound appears slightly worse than last time, left foot wound looks slightly better, right foot redness is marginally better. X-ray of both feet show no air or evidence of osteomyelitis. Patient is finished his Omnicef and terbinafine. He continues to have macerated skin on the bottom of the left foot as well as right 5/26; left calf wound is better, left buttock wound appears to have multiple small superficial open areas with surrounding macerated skin. X-rays that I did last time showed no evidence of osteomyelitis in either foot. He is finished cefdinir and doxycycline. I do not think that he was on terbinafine. He continues to have a large superficial open area on the right foot anterior dorsal and slightly between the first and second toes. I did send him to dermatology 2 months ago or so wondering about whether they would do a fungal scraping. I do not believe they did but did do a culture. We have been using silver alginate to the toe areas, he has been using antifungals at home topically either ketoconazole or Lotrisone. We are using silver collagen on the left foot, silver alginate on the right, silver collagen on the left lateral leg and silver alginate on the left buttock 6/1; left buttock area is healed. We have the left dorsal foot, left  lateral leg and right dorsal foot. We are using silver alginate to the areas on both feet and silver collagen to the area on his left lateral calf 6/8; the left buttock apparently reopened late last week. He is not really sure how this happened. He is tolerating the terbinafine. Using silver alginate to all wounds 6/15; left buttock wound is larger than last week but still superficial. Came in the clinic today with a report of purulence from the left lateral leg I did not identify any infection Both areas on his dorsal feet appear to be better. He is tolerating the terbinafine. Using silver alginate to all wounds 6/22; left buttock is about the same this week, left calf quite a bit better. His left foot is about the same however he comes in with erythema and warmth in the right forefoot once again. Culture that I gave him in the beginning of May showed Enterobacter and E. coli. I gave him doxycycline and things seem to improve although neither 1 of these organisms was specifically plated. 6/29; left buttock is larger and dry this week. Left lateral calf looks to me to be improved. Left dorsal foot also somewhat improved right foot completely unchanged. The erythema on the right foot is still present. He is completing the Ceftin dinner that I gave him empirically [see discussion above.)  7/6 - All wounds look to be stable and perhaps improved, the left buttock wound is slightly smaller, per patient bleeds easily, completed ceftin, the right foot redness is less, he is on terbinafine 7/13; left buttock wound about the same perhaps slightly narrower. Area on the left lateral leg continues to narrow. Left dorsal foot slightly smaller right foot about the same. We are using silver alginate on the right foot and Hydrofera Blue to the areas on the left. Unna boot on the left 2 layer compression on the right 7/20; left buttock wound absolutely the same. Area on lateral leg continues to get better. Left  dorsal foot require debridement as did the right no major change in the 7/27; left buttock wound the same size necrotic debris over the surface. The area on the lateral leg is closed once again. His left foot looks better right foot about the same although there is some involvement now of the posterior first second toe area. He is still on terbinafine which I have given him for a month, not certain a centimeter major change 06/25/19-All wounds appear to be slightly improved according to report, left buttock wound looks clean, both foot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 11/04/16; he's had a reopening between the first and second toe on the left and right. No major change in the left lateral leg wound. There is what appears to be cellulitis of the left dorsal foot 11/18/16 the patient was hospitalized initially in  [place] and then subsequently transferred to [facility] long and was admitted there from 11/09/16 through 11/12/16. He had developed progressive cellulitis on the right leg in spite of the doxycycline I gave him. I'd spoken to the hospitalist in [place]  who was concerned about continuing leukocytosis. CT scan is what I suggested this was done which showed soft tissue swelling without evidence of osteomyelitis or an underlying abscess blood cultures were negative. At [facility] he was treated with vancomycin and Primaxin and then add an infectious disease consult. He was transitioned to Ceftaroline. He has been making progressive improvement. Overall a severe cellulitis of the right leg. He is been using silver alginate to her original wound on the left leg. The wounds in his toes on the right are closed there is a small open area on the base of the left second toe 11/26/15; the patient's right leg is much better although there is still some edema here this could be reminiscent from his severe cellulitis likely on top of some degree of lymphedema. His left anterior leg wound has less surface slough as reported by her intake nurse. Small wound at the base of the left second toe 12/02/16; patient's right leg is better and there is no open wound here. His left anterior lateral leg wound continues to have a healthy-looking surface. Small wound at the base of the left second toe however there is erythema in the left forefoot which is worrisome 12/16/16; is no open wounds on his right leg. We took measurements for stockings. His left anterior lateral leg wound continues to have a healthy-looking surface. I'm not sure where we were with the Apligraf run through his insurance. We have been using Iodoflex. He has a thick eschar on the left first second toe interface, I suspect this may be fungal however there is no visible open 12/23/16; no open wound on his right leg. He has 2 small areas left of the linear wound that was remaining last week. We have been using Prisma, I thought I have disclosed this week, we can only look forward to next week 01/03/17; the patient had concerning areas of erythema last week, already on doxycycline for UTI through his primary  doctor. The erythema is absolutely no better there is warmth and swelling both medially from the left lateral leg wound and also the dorsal left foot. 01/06/17- Patient is here for follow-up evaluation of his left lateral leg ulcer and bilateral feet ulcers. He is on oral antibiotic therapy, tolerating that. Nursing staff and the patient states that the erythema is improved from Monday. 01/13/17; the predominant left lateral leg wound continues to be problematic. I had put Apligraf on him earlier this month once. However he subsequently developed what appeared to be an intense cellulitis around the left lateral leg wound. I gave him Dalvance I think on 2/12 perhaps 2/13 he continues on cefdinir. The erythema is still present but the warmth and swelling is improved. I am hopeful that the cellulitis part of this control. I wouldn't be surprised if there is an element of venous inflammation as well. 01/17/17. The erythema is present but better in the left leg. His left lateral leg wound still does not have a viable surface buttons certain parts of this long thin wound it appears like there has been improvement in dimensions. 01/20/17; the erythema still present but much better in the left leg. I'm thinking this is his usual degree of chronic venous inflammation. The wound on the left leg  looks somewhat better. Is less surface slough 01/27/17; erythema is back to the chronic venous inflammation. The wound on the left leg is somewhat better. I am back to the point where I like to try an Apligraf once again 02/10/17; slight improvement in wound dimensions. Apligraf #2. He is completing his doxycycline 02/14/17; patient arrives today having completed doxycycline last Thursday. This was supposed to be a nurse visit however once again he hasn't tense erythema from the medial part of his wound extending over the lower leg. Also erythema in his foot this is roughly in the same distribution as last time. He has  baseline chronic venous inflammation however this is a lot worse than the baseline I have learned to accept the on him is baseline inflammation 02/24/17- patient is here for follow-up evaluation. He is tolerating compression therapy. His voicing no complaints or concerns he is here anticipating an Apligraf 03/03/17; he arrives today with an adherent necrotic surface. I don't think this is surface is going to be amenable for Apligraf's. The erythema around his wound and on the left dorsal foot has resolved he is off antibiotics 03/10/17; better-looking surface today. I don't think he can tolerate Apligraf's. He tells me he had a wound VAC after a skin graft years ago to this area and they had difficulty with a seal. The erythema continues to be stable around this some degree of chronic venous inflammation but he also has recurrent cellulitis. We have been using Iodoflex 03/17/17; continued improvement in the surface and may be small changes in dimensions. Using Iodoflex which seems the only thing that will control his surface 03/24/17- He is here for follow up evaluation of his LLE lateral ulceration and ulcer to right dorsal foot/toe space. He is voicing no complaints or concerns, He is tolerating compression wrap. 03/31/17 arrives today with a much healthier looking wound on the left lower extremity. We have been using Iodoflex for a prolonged period of time which has for the first time prepared and adequate looking wound bed although we have not had much in the way of wound dimension improvement. He also has a small wound between the first and second toe on the right 04/07/17; arrives today with a healthy-looking wound bed and at least the top 50% of this wound appears to be now her. No debridement was required I have changed him to [facility] last week after prolonged Iodoflex. He did not do well with Apligraf's. We've had a re-opening between the first and second toe on the right 04/14/17; arrives  today with a healthier looking wound bed contractions and the top 50% of this wound and some on the lesser 50%. Wound bed appears healthy. The area between the first and second toe on the right still remains problematic 04/21/17; continued very gradual improvement. Using [facility] 04/28/17; continued very gradual improvement in the left lateral leg venous insufficiency wound. His periwound erythema is very mild. We have been using Hydrofera Blue. Wound is making progress especially in the superior 50% 05/05/17; he continues to have very gradual improvement in the left lateral venous insufficiency wound. Both in terms with an length rings are improving. I debrided this every 2 weeks with #5 curet and we have been using Hydrofera Blue and again making good progress With regards to the wounds between his right first and second toe which I thought might of been tinea pedis he is not making as much progress very dry scaly skin over the area. Also the area  at the base of the left first and second toe in a similar condition 05/12/17; continued gradual improvement in the refractory left lateral venous insufficiency wound on the left. Dimension smaller. Surface still requiring debridement using Hydrofera Blue 05/19/17; continued gradual improvement in the refractory left lateral venous ulceration. Careful inspection of the wound bed underlying rumination suggested some degree of epithelialization over the surface no debridement indicated. Continue Hydrofera Blue difficult areas between his toes first and third on the left than first and second on the right. I'm going to change to silver alginate from silver collagen. Continue ketoconazole as I suspect underlying tinea pedis 05/26/17; left lateral leg venous insufficiency wound. We've been using Hydrofera Blue. I believe that there is expanding epithelialization over the surface of the wound albeit not coming from the wound circumference. This is a bit of  an odd situation in which the epithelialization seems to be coming from the surface of the wound rather than in the exact circumference. There is still small open areas mostly along the lateral margin of the wound. ooHe has unchanged areas between the left first and second and the right first second toes which I been treating for tenia pedis 06/02/17; left lateral leg venous insufficiency wound. We have been using Hydrofera Blue. Somewhat smaller from the wound circumference. The surface of the wound remains a bit on it almost epithelialized sedation in appearance. I use an open curette today debridement in the surface of all of this especially the edges ooSmall open wounds remaining on the dorsal right first and second toe interspace and the plantar left first second toe and her face on the left 06/09/17; wound on the left lateral leg continues to be smaller but very gradual and very dry surface using Hydrofera Blue 06/16/17 requires weekly debridements now on the left lateral leg although this continues to contract. I changed to silver collagen last week because of dryness of the wound bed. Using Iodoflex to the areas on his first and second toes/web space bilaterally 06/24/17; patient with history of paraplegia also chronic venous insufficiency with lymphedema. Has a very difficult wound on the left lateral leg. This has been gradually reducing in terms of with but comes in with a very dry adherent surface. High switch to silver collagen a week or so ago with hydrogel to keep the area moist. This is been refractory to multiple dressing attempts. He also has areas in his first and second toes bilaterally in the anterior and posterior web space. I had been using Iodoflex here after a prolonged course of silver alginate with ketoconazole was ineffective [question tinea pedis] 07/14/17; patient arrives today with a very difficult adherent material over his left lateral lower leg wound. He also  has surrounding erythema and poorly controlled edema. He was switched his Santyl last visit which the nurses are applying once during his doctor visit and once on a nurse visit. He was also reduced to 2 layer compression I'm not exactly sure of the issue here. 07/21/17; better surface today after 1 week of Iodoflex. Significant cellulitis that we treated last week also better. [Doxycycline] 07/28/17 better surface today with now 2 weeks of Iodoflex. Significant cellulitis treated with doxycycline. He has now completed the doxycycline and he is back to his usual degree of chronic venous inflammation/stasis dermatitis. He reminds me he has had ablations surgery here 08/04/17; continued improvement with Iodoflex to the left lateral leg wound in terms of the surface of the wound although the dimensions are better.  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 Problematic areas on the plantar aspect of the first second toe web space on the left and the dorsal aspect of the first second toe web space on the right. At one point I felt these were probably related to chronic fungal infections in treated him aggressively for this although we have not made any improvement here. 08/11/17; left lateral leg. Surface continues to improve with the Iodoflex although we are not seeing much improvement in overall wound dimensions. Areas on his plantar left foot and right foot show no improvement. In fact the right foot looks somewhat worse 08/18/17; left lateral leg. We changed to [facility] Blue last week after a prolonged course of Iodoflex which helps get the surface better. It appears that the wound with is improved. Continue with difficult areas on the left dorsal first second and plantar first second on the right 09/01/17; patient arrives in clinic today having had a temperature of 103 yesterday. He was seen in the ER and [facility]. The patient was  concerned he could have cellulitis again in the right leg however they diagnosed him with a UTI and he is now on Keflex. He has a history of cellulitis which is been recurrent and difficult but this is been in the left leg, in the past 5 use doxycycline. He does in and out catheterizations at home which are risk factors for UTI 09/08/17; patient will be completing his Keflex this weekend. The erythema on the left leg is considerably better. He has a new wound today on the medial part of the right leg small superficial almost looks like a skin tear. He has worsening of the area on the right dorsal first and second toe. His major area on the left lateral leg is better. Using Hydrofera Blue on all areas 09/15/17; gradual reduction in width on the long wound in the left lateral leg. No debridement required. He also has wounds on the plantar aspect of his left first second toe web space and on the dorsal aspect of the right first second toe web space. 09/22/17; there continues to be very gradual improvements in the dimensions of the left lateral leg wound. He hasn't round erythematous spot with might be pressure on his wheelchair. There is no evidence obviously of infection no purulence no warmth ooHe has a dry scaled area on the plantar aspect of the left first second toe ooImproved area on the dorsal right first second toe. 09/29/17; left lateral leg wound continues to improve in dimensions mostly with an is still a fairly long but increasingly narrow wound. ooHe has a dry scaled area on the plantar aspect of his left first second toe web space ooIncreasingly concerning area on the dorsal right first second toe. In fact I am concerned today about possible cellulitis around this wound. The areas extending up his second toe and although there is deformities here almost appears to abut on the nailbed. 10/06/17; left lateral leg wound continues to make very gradual progress. Tissue culture I did  from the right first second toe dorsal foot last time grew MRSA and enterococcus which was vancomycin sensitive. This was not sensitive to clindamycin or doxycycline. He is allergic to Zyvox and sulfa we have therefore arrange for him to have dalvance infusion tomorrow. He is had this in the past and tolerated it well 10/20/17; left lateral leg wound continues to make decent progress. This is certainly reduced  in terms of with there is advancing epithelialization.ooThe cellulitis in the right foot looks better although he still has a deep wound in the dorsal aspect of the first second toe web space. Plantar left first toe web space on the left I think is making some progress 10/27/17; left lateral leg wound continues to make decent progress. Advancing epithelialization.using Hydrofera Blue ooThe right first second toe web space wound is better-looking using silver alginate ooImprovement in the left plantar first second toe web space. Again using silver alginate 11/03/17 left lateral leg wound continues to make decent progress albeit slowly. Using Hydrofera Blue ooThe right per second toe web space continues to be a very problematic looking punched out wound. I obtained a piece of tissue for deep culture I did extensively treated this for fungus. It is difficult to imagine that this is a pressure area as the patient states other than going outside he doesn't really wear shoes at home ooThe left plantar first second toe web space looked fairly senescent. Necrotic edges. This required debridement oochange to Hydrofera Blue to all wound areas 11/10/17; left lateral leg wound continues to contract. Using Hydrofera Blue ooOn the right dorsal first second toe web space dorsally. Culture I did of this area last week grew MRSA there is not an easy oral option in this patient was multiple antibiotic allergies or intolerances. This was only a rare culture isolate I'm therefore going to use Bactroban  under silver alginate ooOn the left plantar first second toe web space. Debridement is required here. This is also unchanged 11/17/17; left lateral leg wound continues to contract using Hydrofera Blue this is no longer the major issue. ooThe major concern here is the right first second toe web space. He now has an open area going from dorsally to the plantar aspect. There is now wound on the inner lateral part of the first toe. Not a very viable surface on this. There is erythema spreading medially into the forefoot. ooNo major change in the left first second toe plantar wound 11/24/17; left lateral leg wound continues to contract using Hydrofera Blue. Nice improvement today ooThe right first second toe web space all of this looks a lot less angry than last week. I have given him clindamycin and topical Bactroban for MRSA and terbinafine for the possibility of underlining tinea pedis that I could not control with ketoconazole. Looks somewhat better ooThe area on the plantar left first second toe web space is weeping with dried debris around the wound 12/01/17; left lateral leg wound continues to contract he Hydrofera Blue. It is becoming thinner in terms of with nevertheless it is making good improvement. ooThe right first second toe web space looks less angry but still a large necrotic-looking wounds starting on the plantar aspect of the right foot extending between the toes and now extensively on the base of the right second toe. I gave him clindamycin and topical Bactroban for MRSA anterior benefiting for the possibility of underlying tinea pedis. Not looking better today ooThe area on the left first/second toe looks better. Debrided of necrotic debris 12/05/17* the patient was worked in urgently today because over the weekend he found blood on his incontinence bad when he woke up. He was found to have an ulcer by his wife who does most of his wound care. He came in today for [place] to look at  this. He has not had a history of wounds in his buttocks in spite of his paraplegia. 12/08/17; seen in  follow-up today at his usual appointment. He was seen earlier this week and found to have a new wound on his buttock. We also follow him for wounds on the left lateral leg, left first second toe web space and right first second toe web space 12/15/17; we have been using Hydrofera Blue to the left lateral leg which has improved. The right first second toe web space has also improved. Left first second toe web space plantar aspect looks stable. The left buttock has worsened using Santyl. Apparently the buttock has drainage 12/22/17; we have been using Hydrofera Blue to the left lateral leg which continues to improve now 2 small wounds separated by normal skin. He tells [place] he had a fever up to 100 yesterday he is prone to UTIs but has not noted anything different. He does in and out catheterizations. The area between the first and second toes today does not look good necrotic surface covered with what looks to be purulent drainage and erythema extending into the third toe. I had gotten this to something that I thought look better last time however it is not look good today. He also has a necrotic surface over the buttock wound which is expanded. I thought there might be infection under here so I removed a lot of the surface with a #5 curet though nothing look like it really needed culturing. He is been using Santyl to this area 12/27/17; his original wound on the left lateral leg continues to improve using Hydrofera Blue. I gave him samples of Baxdella although he was unable to take them out of fear for an allergic reaction ["lump in his throat"].the culture I did of the purulent drainage from his second toe last week showed both enterococcus and a set Enterobacter I was also concerned about the erythema on the bottom of his foot although paradoxically although this looks somewhat better today. 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 We've been using silver alginate here. They came up today that he sleeps in a recliner, I'm not sure why but I asked him to stop this 01/03/18; his original wound we've been using Hydrofera Blue is now separated into 2 areas. ooUlcer on his left buttock is better he is off the recliner and sleeping in bed ooFinally both wound areas between his first and second toes also looks some better 01/10/18; his original wound on the left lateral leg is now separated into 2 wounds we've been using Hydrofera Blue ooUlcer on his left buttock has some drainage. There is a small probing site going into muscle layer superiorly.using silver alginate -He arrives today with a deep tissue injury on the left heel ooThe wound on the dorsal aspect of his first second toe on the left looks a lot betterusing silver alginate ketoconazole ooThe area on the first second toe web space on the right also looks a lot bette 01/17/18; his original wound on the left lateral leg continues to progress using Hydrofera Blue ooUlcer on his left buttock also is smaller surface healthier except for a small probing site going into the muscle layer superiorly. 2.4 cm of tunneling in this area ooDTI on his left heel we have only been offloading. Looks better than last week no threatened open no evidence of infection oothe wound on the dorsal aspect of the first second toe on the left continues to look like it's regressing we have only been  using silver alginate and terbinafine orally ooThe area in the first second toe web space on the right also looks to be a lot better using silver alginate and terbinafine I think this was prompted by tinea pedis 01/31/18; the patient was hospitalized in [place] last week apparently for a complicated UTI. He was discharged on cefepime he does in and out catheterizations.  In the hospital he was discovered M I don't mild elevation of AST and ALTs and the terbinafine was stopped.predictably the pressure ulcer on his buttock looks betterusing silver alginate. The area on the left lateral leg also is better using Hydrofera Blue. The area between the first and second toes on the left better. First and second toes on the right still substantial but better. Finally the DTI on the left heel has held together and looks like it's resolving 02/07/18-he is here in follow-up evaluation for multiple ulcerations. He has new injury to the lateral aspect of the last issue a pressure ulcer, he states this is from adhesive removal trauma. He states he has tried multiple adhesive products with no success. All other ulcers appear stable. The left heel DTI is resolving. We will continue with same treatment plan and follow-up next week. 02/14/18; follow-up for multiple areas. ooHe has a new area last week on the lateral aspect of his pressure ulcer more over the posterior trochanter. The original pressure ulcer looks quite stable has healthy granulation. 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 Almost fully epithelialized on the original superior area using Hydrofera Blue ooDTI on the left heel has peeled off this week to reveal a small superficial wound under denuded skin and subcutaneous tissue ooBoth areas between the first and second toes look better including nothing open on the left 02/21/18; ooThe patient's wounds on his left ischial tuberosity and posterior left greater trochanter actually looked better. He has a large area of irritation around the area which I think is contact dermatitis. I am doubtful that this is fungal ooHis original wound on the left lateral calf continues to improve we have been using Hydrofera Blue ooThere is no open area in the left first  second toe web space although there is a lot of thick callus ooThe DTI on the left heel required debridement today of necrotic surface eschar and subcutaneous tissue using silver alginate ooFinally the area on the right first second toe webspace continues to contract using silver alginate and ketoconazole 02/28/18 ooLeft ischial tuberosity wounds look better using silver alginate. ooOriginal wound on the left calf only has one small open area left using Hydrofera Blue ooDTI on the left heel required debridement mostly removing skin from around this wound surface. Using silver alginate ooThe areas on the right first/second toe web space using silver alginate and ketoconazole 03/08/18 on evaluation today patient appears to be doing decently well as best I can tell in regard to his wounds. This is the first time that I have seen him as he generally is followed by Dr. Dellia Nims. With that being said none of his wounds appear to be infected he does have an area where there is some skin covering what appears to be a new wound on the left dorsal surface of his great toe. This is right at the nail bed. With that being said I do believe that debrided away some of the excess skin can be of benefit in this regard. Otherwise he  has been tolerating the dressing changes without complication. 03/14/18; patient arrives today with the multiplicity of wounds that we are following. He has not been systemically unwell ooOriginal wound on the left lateral calf now only has 2 small open areas we've been using Hydrofera Blue which should continue ooThe deep tissue injury on the left heel requires debridement today. We've been using silver alginate ooThe left first second toe and the right first second toe are both are reminiscence what I think was tinea pedis. Apparently some of the callus Surface between the toes was removed last week when it started draining. ooPurulent drainage coming from the wound on the  ischial tuberosity on the left. 03/21/18-He is here in follow-up evaluation for multiple wounds. 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 He tolerated debridement. The only change to last week's recommendations is to discontinue antifungal cream between toes. He will follow-up next week 03/28/18; following up for multiple wounds;Concern this week is streaking redness and swelling in the right foot. He is going to need antibiotics for this. 03/31/18; follow-up for right foot cellulitis. Streaking redness and swelling in the right foot on 03/28/18. 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 I put him on clindamycin 300 mg every 6 and brought him in for a quick check. He has an open wound between his first and second toes on the right foot as a potential source. 04/04/18; ooRight foot cellulitis is resolving he is completing clindamycin. This is truly good news ooLeft lateral calf wound which is initial wound only has one small open area inferiorly this is close to healing out. He has compression stockings. We will use Hydrofera Blue right down to the epithelialization of this ooNonviable surface on the left heel which was initially pressure with a DTI. We've been using Hydrofera Blue. I'm going to switch this back to silver alginate ooLeft first second toe/tinea pedis this looks better using silver alginate ooRight first second toe tinea pedis using silver alginate ooLarge pressure ulcers on theLeft ischial tuberosity. Small wound here Looks better. I am uncertain about the surface over the large wound. Using silver alginate 04/11/18; ooCellulitis in the right foot is resolved ooLeft lateral calf wound which was his original wounds still has 2 tiny open areas remaining this is just about closed ooNonviable surface on the left heel is better but still requires debridement ooLeft first second  toe/tinea pedis still open using silver alginate ooRight first second toe wound tinea pedis I asked him to go back to using ketoconazole and silver alginate ooLarge pressure ulcers on the left ischial tuberosity this shear injury here is resolved. Wound is smaller. No evidence of infection using silver alginate 04/18/18; ooPatient arrives with an intense area of cellulitis in the right mid lower calf extending into the right heel area. Bright red and warm. Smaller area on the left anterior leg. He has a significant history of MRSA. He will definitely need antibioticsoodoxycycline ooHe now has 2 open areas on the left ischial tuberosity the original large wound and now a satellite area which I think was above his initial satellite areas. Not a wonderful surface on this satellite area surrounding erythema which looks like pressure related. ooHis left lateral calf wound again his original wound is just about closed ooLeft heel pressure injury still requiring debridement ooLeft first second toe looks a lot better using silver alginate ooRight first second toe also using silver alginate and ketoconazole cream  also looks better 04/20/18; the patient was worked in early today out of concerns with his cellulitis on the right leg. I had started him on doxycycline. This was 2 days ago. His wife was concerned about the swelling in the area. Also concerned about the left buttock. He has not been systemically unwell no fever chills. No nausea vomiting or diarrhea 04/25/18; the patient's left buttock wound is continued to deteriorate he is using Hydrofera Blue. He is still completing clindamycin for the cellulitis on the right leg although all of this looks better. 05/02/18 ooLeft buttock wound still with a lot of drainage and a very tightly adherent fibrinous necrotic surface. He has a deeper area superiorly ooThe left lateral calf wound is still closed ooDTI wound on the left heel necrotic surface  especially the circumference using Iodoflex ooAreas between his left first second toe and right first second toe both look better. Dorsally and the right first second toe he had a necrotic surface although at smaller. In using silver alginate and ketoconazole. I did a culture last week which was a deep tissue culture of the reminiscence of the open wound on the right first second toe dorsally. This grew a few Acinetobacter and a few methicillin-resistant staph aureus. Nevertheless the area actually this week looked better. I didn't feel the need to specifically address this at least in terms of systemic antibiotics. 05/09/18; wounds are measuring larger more drainage per our intake. We are using Santyl covered with alginate on the large superficial buttock wounds, Iodosorb on the left heel, ketoconazole and silver alginate to the dorsal first and second toes bilaterally. 05/16/18; ooThe area on his left buttock better in some aspects although the area superiorly over the ischial tuberosity required an extensive debridement.using Santyl ooLeft heel appears stable. Using Iodoflex ooThe areas between his first and second toes are not bad however there is spreading erythema up the dorsal aspect of his left foot this looks like cellulitis again. He is insensate the erythema is really very brilliant.o Erysipelas He went to see an allergist days ago because he was itching part of this he had lab work done. This showed a white count of 15.1 with 70% neutrophils. Hemoglobin of 11.4 and a platelet count of 659,000. Last white count we had in Epic was a 2-1/2 years ago which was 25.9 but he was ill at the time. He was able to show me some lab work that was done by his primary physician the pattern is about the same. 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 But prompted me to go ahead and do x-rays of both feet and the pelvis rule out osteomyelitis. He also  had a comprehensive metabolic panel this was reasonably normal his albumin was 3.7 liver function tests BUN/creatinine all normal 05/23/18; x-rays of both his feet from last week were negative for underlying pulmonary abnormality. The x-ray of his pelvis however showed mild irregularity in the left ischial which may represent some early osteomyelitis. The wound in the left ischial continues to get deeper clearly now exposed muscle. Each week necrotic surface material over this area. Whereas the rest of the wounds do not look so bad. ooThe left ischial wound we have been using Santyl and calcium alginate ooTo the left heel surface necrotic debris using Iodoflex ooThe left lateral leg is still healed ooAreas on the left dorsal foot and the right dorsal foot are about the same. There is some inflammation  on the left which might represent contact dermatitis, fungal dermatitis I am doubtful cellulitis although this looks better than last week 05/30/18; CT scan done at Hospital did not show any osteomyelitis or abscess. Suggested the possibility of underlying cellulitis although I don't see a lot of evidence of this at the bedside ooThe wound itself on the left buttock/upper thigh actually looks somewhat better. No debridement ooLeft heel also looks better no debridement continue Iodoflex ooBoth dorsal first second toe spaces appear better using Lotrisone. Left still required debridement 06/06/18; ooIntake reported some purulent looking drainage from the left gluteal wound. Using Santyl and calcium alginate ooLeft heel looks better although still a nonviable surface requiring debridement ooThe left dorsal foot first/second webspace actually expanding and somewhat deeper. I may consider doing a shave biopsy of this area ooRight dorsal foot first/second webspace appears stable to improved. Using Lotrisone and silver alginate to both these areas 06/13/18 ooLeft gluteal surface looks better. Now  separated in the 2 wounds. No debridement required. Still drainage. We'll continue silver alginate ooLeft heel continues to look better with Iodoflex continue this for at least another week ooOf his dorsal foot wounds the area on the left still has some depth although it looks better than last week. We've been using Lotrisone and silver alginate 06/20/18 ooLeft gluteal continues to look better healthy tissue ooLeft heel continues to look better healthy granulation wound is smaller. He is using Iodoflex and his long as this continues continue the Iodoflex ooDorsal right foot looks better unfortunately dorsal left foot does not. There is swelling and erythema of his forefoot. He had minor trauma to this several days ago but doesn't think this was enough to have caused any tissue injury. Foot looks like cellulitis, we have had this problem before 06/27/18 on evaluation today patient appears to be doing a little worse in regard to his foot ulcer. 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 Both of which would really be his only options for treating this infection. In the past he has been given and effusion of Orbactiv. This is done very well for him in the past again it's one time dosing IV antibiotic therapy. Subsequently I do believe this is something we're gonna need to see about doing at this point in time. Currently his other wounds seem to be doing somewhat better in my pinion I'm pretty happy in that regard. 07/03/18 on evaluation today patient's wounds actually appear to be doing fairly well. He has been tolerating the dressing changes without complication. All in all he seems to be showing signs of improvement. In regard to the antibiotics he has been dealing with infectious disease since I saw him last week as far as getting this scheduled. In the  end [facility]'s [facility] this coming Friday. In the meantime he has been continuing to perform the dressing changes in such as previous. There does not appear to be any evidence of infection worsengin at this time. 07/10/18; ooSince I last saw this man 2 weeks ago things have actually improved. IV antibiotics of resulted in less forefoot erythema although there is still some present. He is not systemically unwell ooLeft buttock wounds o2 now have no depth there is increased epithelialization Using silver alginate ooLeft heel still requires debridement using Iodoflex ooLeft dorsal  foot still with a sizable wound about the size of a border but healthy granulation ooRight dorsal foot still with a slitlike area using silver alginate 07/18/18; the patient's cellulitis in the left foot is improved in fact I think it is on its way to resolving. ooLeft buttock wounds o2 both look better although the larger one has hypertension granulation we've been using silver alginate ooLeft heel has some thick circumferential redundant skin over the wound edge which will need to be removed today we've been using Iodoflex ooLeft dorsal foot is still a sizable wound required debridement using silver alginate ooThe right dorsal foot is just about closed only a small open area remains here 07/25/18; left foot cellulitis is resolved ooLeft buttock wounds o2 both look better. Hyper-granulation on the major area ooLeft heel as some debris over the surface but otherwise looks a healthier wound. Using silver collagen ooRight dorsal foot is just about closed 07/31/18; arrives with our intake nurse worried about purulent drainage from the buttock. We had hyper-granulation here last week ooHis buttock wounds o2 continue to look better ooLeft heel some debris over the surface but measuring smaller. ooRight dorsal foot unfortunately has openings between the  toes ooLeft foot superficial wound looks less aggravated. 08/07/18 ooButtock wounds continue to look better although some of her granulation and the larger medial wound. silver alginate ooLeft heel continues to look a lot better.silver collagen ooLeft foot superficial wound looks less stable. Requires debridement. He has a new wound superficial area on the foot on the lateral dorsal foot. ooRight foot looks better using silver alginate without Lotrisone 08/14/2018; patient was in the ER last week diagnosed with a UTI. He is now on Cefpodoxime and Macrodantin. ooButtock wounds continued to be smaller. Using silver alginate ooLeft heel continues to look better using silver collagen ooLeft foot superficial wound looks as though it is improving ooRight dorsal foot area is just about healed. 08/21/2018; patient is completed his antibiotics for his UTI. ooHe has 2 open areas on the buttocks. There is still not closed although the surface looks satisfactory. Using silver alginate ooLeft heel continues to improve using silver collagen ooThe bilateral dorsal foot areas which are at the base of his first and second toes/possible tinea pedis are actually stable on the left but worse on the right. The area on the left required debridement of necrotic surface. After debridement I obtained a specimen for PCR culture. ooThe right dorsal foot which is been just about healed last week is now reopened 08/28/2018; culture done on the left dorsal foot showed coag negative staph both staph epidermidis and Lugdunensis. I think this is worthwhile initiating systemic treatment. I will use doxycycline given his long list of allergies. The area on the left heel slightly improved but still requiring debridement. ooThe large wound on the buttock is just about closed whereas the smaller one is larger. Using silver alginate in this area 09/04/2018; patient is completing his doxycycline for the left foot  although this continues to be a very difficult wound area with very adherent necrotic debris. We are using silver alginate to all his wounds right foot left foot and the small wounds on his buttock, silver collagen on the left heel. 09/11/2018; once again this patient has intense erythema and swelling of the left forefoot. Lesser degrees of erythema in the right foot. He has a long list of allergies and intolerances. I will reinstitute doxycycline. 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 Using silver alginate ooLeft heel also looks better using silver collagen ooUnfortunately both the areas on his feet look worse. The area on the left first second webspace is now gone through to the plantar part of his foot. The area on the left foot anteriorly is irritated with erythema and swelling in the forefoot. 09/25/2018 ooHis wound on the left plantar heel looks better. Using silver collagen ooThe area on the left buttock 2 small remnant areas. One is closed one is still open. Using silver alginate ooThe areas between both his first and second toes look worse. This in spite of long-standing antifungal therapy with ketoconazole and silver alginate which should have antifungal activity ooHe has small areas around his original wound on the left calf one is on the bottom of the original scar tissue and one superiorly both of these are small and superficial but again given wound history in this site this is worrisome 10/02/2018 ooLeft plantar heel continues to gradually contract using silver collagen ooLeft buttock wound is unchanged using silver alginate ooThe areas on his dorsal feet between his first and second toes bilaterally look about the same. 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 Small open area here. We have been using silver alginate 10/09/2018; ooLeft plantar heel continues to look healthy and contract. No debridement is required ooLeft buttock slightly smaller with a tape injury wound just below which was new this week ooDorsal feet somewhat improved I have been using clindamycin ooLeft lateral looks lower extremity the actual open area looks worse although a lot of this is epithelialized. I am going to change to silver collagen today He has a lot more swelling in the right leg although this is not pitting not red and not particularly warm there is a lot of spasm in the right leg usually indicative of people with paralysis of some underlying discomfort. We have reviewed his vascular status from 2017 he had a left greater saphenous vein ablation. I wonder about referring him back to vascular surgery if the area on the left leg continues to deteriorate. 10/16/2018 in today for follow-up and management of multiple lower extremity ulcers. His left Buttock wound is much lower smaller and almost closed completely. The wound to the left ankle has began to reopen with Epithelialization and some adherent slough. He has multiple new areas to the left foot and leg. The left dorsal foot without much improvement. Wound present between left great webspace and 2nd toe. Erythema and edema present right leg. Right LE ultrasound obtained on 10/10/18 was negative for DVT. 10/23/2018; ooLeft buttock is closed over. Still dry macerated skin but there is no open wound. I suspect this is chronic pressure/moisture ooLeft lateral calf is quite a bit worse than when I saw this last. There is clearly drainage here he has macerated skin into the left plantar heel. We will change the primary dressing to alginate ooLeft dorsal foot has some improvement in overall wound area. Still using clindamycin and silver alginate ooRight dorsal foot about the same as the  left using clindamycin and silver alginate ooThe erythema in the right leg has resolved. He is DVT rule out was negative ooLeft heel pressure area required debridement although the wound is smaller and the surface is health 10/26/2018 ooThe patient came back in for his nurse check  today predominantly because of the drainage coming out of the left lateral leg with a recent reopening of his original wound on the left lateral calf. He comes in today with a large amount of surrounding erythema around the wound extending from the calf into the ankle and even in the area on the dorsal foot. He is not systemically unwell. He is not febrile. Nevertheless this looks like cellulitis. We have been using silver alginate to the area. I changed him to a regular visit and I am going to prescribe him doxycycline. The rationale here is a long list of medication intolerances and a history of MRSA. I did not see anything that I thought would provide a valuable culture 10/30/2018 ooFollow-up from his appointment 4 days ago with really an extensive area of cellulitis in the left calf left lateral ankle and left dorsal foot. I put him on doxycycline. He has a long list of medication allergies which are true allergy reactions. 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 In any case he is a lot better today. The erythema especially in the anterior and lateral left calf is better. He still has left ankle erythema. He also is complaining about increasing edema in the right leg we have only been using Kerlix Coban and he has been doing the wraps at home. Finally he has a spotty rash on the medial part of his upper left calf which looks like folliculitis or perhaps wrap occlusion type injury. Small superficial macules not pustules 11/06/18 patient arrives today with again a considerable degree of erythema around the wound on the left lateral  calf extending into the dorsal ankle and dorsal foot. This is a lot worse than when I saw this last week. He is on doxycycline really with not a lot of improvement. He has not been systemically unwell Wounds on the; left heel actually looks improved. Original area on the left foot and proximity to the first and second toes looks about the same. He has superficial areas on the dorsal foot, anterior calf and then the reopening of his original wound on the left lateral calf which looks about the same ooThe only area he has on the right is the dorsal webspace first and second which is smaller. ooHe has a large area of dry erythematous skin on the left buttock small open area here. 11/13/2018; the patient arrives in much better condition. The erythema around the wound on the left lateral calf is a lot better. Not sure whether this was the clindamycin or the TCA and ketoconazole or just in the improvement in edema control [stasis dermatitis]. In any case this is a lot better. The area on the left heel is very small and just about resolved using silver collagen we have been using silver alginate to the areas on his dorsal feet 11/20/2018; his wounds include the left lateral calf, left heel, dorsal aspects of both feet just proximal to the first second webspace. He is stable to slightly improved. I did not think any changes to his dressings were going to be necessary 11/27/2018 he has a reopening on the left buttock which is surrounded by what looks like tinea or perhaps some other form of dermatitis. The area on the left dorsal foot has some erythema around it I have marked this area but I am not sure whether this is cellulitis or not. Left heel is not closed. Left calf the reopening is really  slightly longer and probably worse 1/13; in general things look better and smaller except for the left dorsal foot. Area on the left heel is just about closed, left buttock looks better only a small wound remains  in the skin looks better [using Lotrisone] 1/20; the area on the left heel only has a few remaining open areas here. Left lateral calf about the same in terms of size, left dorsal foot slightly larger right lateral foot still not closed. The area on the left buttock has no open wound and the surrounding skin looks a lot better 1/27; the area on the left heel is closed. Left lateral calf better but still requiring extensive debridements. The area on his left buttock is closed. He still has the open areas on the left dorsal foot which is slightly smaller in the right foot which is slightly expanded. We have been using Iodoflex on these areas as well 2/3; left heel is closed. Left lateral calf still requiring debridement using Iodoflex there is no open area on his left buttock however he has dry scaly skin over a large area of this. Not really responding well to the Lotrisone. Finally the areas on his dorsal feet at the level of the first second webspace are slightly smaller on the right and about the same on the left. Both of these vigorously debrided with Anasept and gauze 2/10; left heel remains closed he has dry erythematous skin over the left buttock but there is no open wound here. Left lateral leg has come in and with. Still requiring debridement we have been using Iodoflex here. Finally the area on the left dorsal foot and right dorsal foot are really about the same extremely dry callused fissured areas. He does not yet have a dermatology appointment 2/17; left heel remains closed. He has a new open area on the left buttock. The area on the left lateral calf is bigger longer and still covered in necrotic debris. No major change in his foot areas bilaterally. I am awaiting for a dermatologist to look on this. We have been using ketoconazole I do not know that this is been doing any good at all. 2/24; left heel remains closed. The left buttock wound that was new reopening last week looks  better. The left lateral calf appears better also although still requires debridement. The major area on his foot is the left first second also requiring debridement. We have been putting Prisma on all wounds. I do not believe that the ketoconazole has done too much good for his feet. He will use Lotrisone I am going to give him a 2-week course of terbinafine. We still do not have a dermatology appointment 3/2 left heel remains closed however there is skin over bone in this area I pointed this out to him today. The left buttock wound is epithelialized but still does not look completely stable. The area on the left leg required debridement were using silver collagen here. With regards to his feet we changed to Lotrisone last week and silver alginate. 3/9; left heel remains closed. Left buttock remains closed. The area on the right foot is essentially closed. The left foot remains unchanged. Slightly smaller on the left lateral calf. Using silver collagen to both of these areas 3/16-Left heel remains closed. Area on right foot is closed. Left lateral calf above the lateral malleolus open wound requiring debridement with easy bleeding. Left dorsal wound proximal to first toe also debrided. Left ischial area open new. Patient has been  using Prisma with wrapping every 3 days. Dermatology appointment is apparently tomorrow.Patient has completed his terbinafine 2-week course with some apparent improvement according to him, there is still flaking and dry skin in his foot on the left 3/23; area on the right foot is reopened. The area on the left anterior foot is about the same still a very necrotic adherent surface. He still has the area on the left leg and reopening is on the left buttock. He apparently saw dermatology although I do not have a note. According to the patient who is usually fairly well informed they did not have any good ideas. Put him on oral terbinafine which she is been on before. 3/30;  using silver collagen to all wounds. Apparently his dermatologist put him on doxycycline and rifampin presumably some culture grew staph. I do not have this result. He remains on terbinafine although I have used terbinafine on him before 4/6; patient has had a fairly substantial reopening on the right foot between the first and second toes. He is finished his terbinafine and I believe is on doxycycline and rifampin still as prescribed by dermatology. We have been using silver collagen to all his wounds although the patient reports that he thinks silver alginate does better on the wounds on his buttock. 4/13; the area on his left lateral calf about the same size but it did not require debridement. ooLeft dorsal foot just proximal to the webspace between the first and second toes is about the same. Still nonviable surface. I note some superficial bronze discoloration of the dorsal part of his foot ooRight dorsal foot just proximal to the first and second toes also looks about the same. I still think there may be the same discoloration I noted above on the left ooLeft buttock wound looks about the same 4/20; left lateral calf appears to be gradually contracting using silver collagen. 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 The left dorsal foot wound is debrided of tightly adherent necrotic debris and really cleans up quite nicely. The right area is worse with expansion. I did not debride this it is now over the base of the second toe ooThe area on his left buttock is smaller no debridement is required using silver collagen 5/4; left calf continues to make good progress. ooHe arrives with erythema around the wounds on his dorsal foot which even extends to the plantar aspect. Very concerning for coexistent infection. He is finished the erythromycin I gave him for possible erythrasma this does not seem to have helped. ooThe area on  the left foot is about the same base of the dorsal toes ooIs area on the buttock looks improved on the left 5/11; left calf and left buttock continued to make good progress. Left foot is about the same to slightly improved. ooMajor problem is on the right foot. He has not had an x-ray. Deep tissue culture I did last week showed both Enterobacter and E. coli. I did not change the doxycycline I put him on empirically although neither 1 of these were plated to doxycycline. He arrives today with the erythema looking worse on both the dorsal and plantar foot. Macerated skin on the bottom of the foot. he has not been systemically unwell 5/18-Patient returns at 1 week, left calf wound appears to be making some progress, left buttock wound appears slightly worse than last time, left foot wound looks slightly better, right foot redness is marginally better. X-ray of both feet  show no air or evidence of osteomyelitis. Patient is finished his Omnicef and terbinafine. He continues to have macerated skin on the bottom of the left foot as well as right 5/26; left calf wound is better, left buttock wound appears to have multiple small superficial open areas with surrounding macerated skin. X-rays that I did last time showed no evidence of osteomyelitis in either foot. He is finished cefdinir and doxycycline. I do not think that he was on terbinafine. He continues to have a large superficial open area on the right foot anterior dorsal and slightly between the first and second toes. I did send him to dermatology 2 months ago or so wondering about whether they would do a fungal scraping. I do not believe they did but did do a culture. We have been using silver alginate to the toe areas, he has been using antifungals at home topically either ketoconazole or Lotrisone. We are using silver collagen on the left foot, silver alginate on the right, silver collagen on the left lateral leg and silver alginate on the left  buttock 6/1; left buttock area is healed. We have the left dorsal foot, left lateral leg and right dorsal foot. We are using silver alginate to the areas on both feet and silver collagen to the area on his left lateral calf 6/8; the left buttock apparently reopened late last week. He is not really sure how this happened. He is tolerating the terbinafine. Using silver alginate to all wounds 6/15; left buttock wound is larger than last week but still superficial. ooCame in the clinic today with a report of purulence from the left lateral leg I did not identify any infection ooBoth areas on his dorsal feet appear to be better. He is tolerating the terbinafine. Using silver alginate to all wounds 6/22; left buttock is about the same this week, left calf quite a bit better. His left foot is about the same however he comes in with erythema and warmth in the right forefoot once again. Culture that I gave him in the beginning of May showed Enterobacter and E. coli. I gave him doxycycline and things seem to improve although neither 1 of these organisms was specifically plated. 6/29; left buttock is larger and dry this week. Left lateral calf looks to me to be improved. Left dorsal foot also somewhat improved right foot completely unchanged. The erythema on the right foot is still present. He is completing the Ceftin dinner that I gave him empirically [see discussion above.) 7/6 - All wounds look to be stable and perhaps improved, the left buttock wound is slightly smaller, per patient bleeds easily, completed ceftin, the right foot redness is less, he is on terbinafine 7/13; left buttock wound about the same perhaps slightly narrower. Area on the left lateral leg continues to narrow. Left dorsal foot slightly smaller right foot about the same. We are using silver alginate on the right foot and Hydrofera Blue to the areas on the left. Unna boot on the left 2 layer compression on the right 7/20; left  buttock wound absolutely the same. Area on lateral leg continues to get better. Left dorsal foot require debridement as did the right no major change in the 7/27; left buttock wound the same size necrotic debris over the surface. The area on the lateral leg is closed once again. His left foot looks better right foot about the same although there is some involvement now of the posterior first second toe area. He is still on  terbinafine which I have given him for a month, not certain a centimeter major change 06/25/19-All wounds appear to be slightly improved according to report, left buttock wound looks clean, both foot wounds have minimal to no debris the right dorsal foot has minimal slough. We are using Hydrofera Blue to the left and silver alginate to the right foot and ischial wound. 8/10-Wounds all appear to be around the same, the right forefoot distal part has some redness which was not there before, however the wound looks clean and small. Ischial wound looks about the same with no changes 8/17; his wound on the left lateral calf which was his original chronic venous insufficiency wound remains closed. Since I last saw him the areas on the left dorsal foot right dorsal foot generally appear better but require debridement. The area on his left initial tuberosity appears somewhat larger to me perhaps hyper granulated and bleeds very easily. We have been using Hydrofera Blue to the left dorsal foot and silver alginate to everything else 8/24; left lateral calf remains closed. The areas on his dorsal feet on the webspace of the first and second toes bilaterally both look better. The area on the left buttock which is the pressure ulcer stage II slightly smaller. I change the dressing to Hydrofera Blue to all areas 8/31; left lateral calf remains closed. The area on his dorsal feet bilaterally look better. Using Hydrofera Blue. Still requiring debridement on the left foot. No change in the left  buttock pressure ulcers however 9/14; left lateral calf remains closed. Dorsal feet look quite a bit better than 2 weeks ago. Flaking dry skin also a lot better with the ammonium lactate I gave him 2 weeks ago. The area on the left buttock is improved. He states that his Roho cushion developed a leak and he is getting a new one, in the interim he is offloading this vigorously 9/21; left calf remains closed. Left heel which was a possible DTI looks better this week. He had macerated tissue around the left dorsal foot right foot looks satisfactory and improved left buttock wound. I changed his dressings to his feet to silver alginate bilaterally. Continuing Hydrofera Blue on the left buttock. 9/28 left calf remains closed. Left heel did not develop anything [possible DTI] dry flaking skin on the left dorsal foot. Right foot looks satisfactory. Improved left buttock wound. We are using silver alginate on his feet Hydrofera Blue on the buttock. I have asked him to go back to the Lotrisone on his feet including the wounds and surrounding areas 10/5; left calf remains closed. The areas on the left and right feet about the same. A lot of this is epithelialized however debris over the remaining open areas. He is using Lotrisone and silver alginate. The area on the left buttock using Hydrofera Blue 10/26. Patient has been out for 3 weeks secondary to Covid concerns. He tested negative but I think his wife tested positive. He comes in today with the left foot substantially worse, right foot about the same. Even more concerning he states that the area on his left buttock closed over but then reopened and is considerably deeper in one aspect than it was before [stage III wound] 11/2; left foot really about the same as last week. Quarter sized wound on the dorsal foot just proximal to the first second toes. Surrounding erythema with areas of denuded epithelium. This is not really much different looking.  Did not look like cellulitis this time however. ooRight  foot area about the same.. We have been using silver alginate alginate on his toes ooLeft buttock still substantial irritated skin around the wound which I think looks somewhat better. We have been using Hydrofera Blue here. 11/9; left foot larger than last week and a very necrotic surface. Right foot I think is about the same perhaps slightly smaller. Debris around the circumference also addressed. Unfortunately on the left buttock there is been a decline. Satellite lesions below the major wound distally and now a an additional one posteriorly we have been using Hydrofera Blue but I think this is a pressure issue 11/16; left foot ulcer dorsally again a very adherent necrotic surface. Right foot is about the same. Not much change in the pressure ulcer on his left buttock. 11/30; left foot ulcer dorsally basically the same as when I saw him 2 weeks ago. Very adherent fibrinous debris on the wound surface. Patient reports a lot of drainage as well. 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 We have been using Iodoflex, patient changed back to alginate because of the drainage. Area on his right dorsal foot really looks benign with a healthier surface certainly a lot better than on the left. Left buttock wounds all improved using Hydrofera Blue 12/7; left dorsal foot again no improvement. Tightly adherent debris. PCR culture I did last week only showed likely skin contaminant. I have gone ahead and done a punch biopsy of this which is about the last thing in terms of investigations I can think to do. He has known venous insufficiency and venous hypertension and this could be the issue here. The area on the right foot is about the same left buttock slightly worse according to our intake nurse secondary to [facility] Blue sticking to the wound Objective Constitutional Sitting or standing Blood  Pressure is within target range for patient.. Pulse regular and within target range for patient.Marland Kitchen Respirations regular, non-labored and within target range.. Temperature is normal and within the target range for the patient.Marland Kitchen Appears in no distress. Vitals Time Taken: 7:50 AM, Height: 70 in, Weight: 216 lbs, BMI: 31, Temperature: 98.2 F, Pulse: 115 bpm, Respiratory Rate: 20 breaths/min, Blood Pressure: 129/72 mmHg. General Notes: Wound exam ooOn the left this looks about the same. Tightly adherent debris. The wound debrided switch to a reasonable surface however each week it comes back with the same very adherent necrotic surface. I used a #5 curette. The depth that this is worse. I went ahead and did a punch biopsy of the superior edge of this hemostasis with silver nitrate and direct pressure ooOn the right this looks about the same certainly not closed but no worse. Much more superficial ooLeft buttock appeared to have more inferior excoriated areas Integumentary (Hair, Skin) Wound #17 status is Open. Original cause of wound was Gradually Appeared. The wound is located on the Right,Dorsal Toe - Web between 1st and 2nd. The wound measures 4.5cm length x 3cm width x 0.1cm depth; 10.603cm^2 area and 1.06cm^3 volume. There is Fat Layer (Subcutaneous Tissue) Exposed exposed. There is no tunneling or undermining noted. There is a medium amount of serosanguineous drainage noted. The wound margin is distinct with the outline attached to the wound base. There is medium (34-66%) pale granulation within the wound bed. There is a medium (34-66%) amount of necrotic tissue within the wound bed including Adherent Slough. Wound #24 status is Open. Original cause of wound was Gradually Appeared. The wound is located on  the Left,Dorsal Foot. The wound measures 2.8cm length x 2.6cm width x 0.3cm depth; 5.718cm^2 area and 1.715cm^3 volume. There is Fat Layer (Subcutaneous Tissue) Exposed exposed. There is no  tunneling or undermining noted. There is a medium amount of serosanguineous drainage noted. The wound margin is distinct with the outline attached to the wound base. There is small (1-33%) pale granulation within the wound bed. There is a large (67- 100%) amount of necrotic tissue within the wound bed including Adherent Slough. Wound #35 status is Open. Original cause of wound was Gradually Appeared. The wound is located on the Left Ischium. The wound measures 1cm length x 0.4cm width x 0.1cm depth; 0.314cm^2 area and 0.031cm^3 volume. There is Fat Layer (Subcutaneous Tissue) Exposed exposed. There is a medium amount of serosanguineous drainage noted. The wound margin is flat and intact. There is large (67-100%) red, friable granulation within the wound bed. There is no necrotic tissue within the wound bed. 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 There was a Excisional Skin/Subcutaneous Tissue Debridement with a total area of 7.28 sq cm performed by Ricard Dillon., MD. With the following instrument(s): Curette to remove Viable and Non-Viable tissue/material. Material removed includes Subcutaneous Tissue and Slough and. No specimens were taken. A time out was conducted at 08:17, prior to the start of the procedure. A Minimum amount of bleeding was controlled with Pressure. The procedure was tolerated well with a pain level of 0 throughout and a pain level of 0 following the procedure. Post Debridement Measurements: 2.8cm length x 2.6cm width x 0.3cm depth; 1.715cm^3 volume. Character of Wound/Ulcer Post Debridement is improved. Post procedure Diagnosis Wound  #24: Same as Pre-Procedure Pre-procedure diagnosis of Wound #24 is an Inflammatory located on the Left, Dorsal Foot . There was a biopsy performed by Ricard Dillon., MD. There was a biopsy performed on Wound Bed. The skin was cleansed and prepped with anti-septic. Utilizing a 4 mm tissue punch, tissue was removed at its base with the following instrument(s): Forceps and sent to pathology. A Moderate amount of bleeding was controlled with Silver Nitrate. A time out was conducted at 08:22, prior to the start of the procedure. The procedure was tolerated well with a pain level of 0 throughout and a pain level of 0 following the procedure. Post procedure Diagnosis Wound #24: Same as Pre-Procedure Plan Follow-up Appointments: Return Appointment in 1 week. Dressing Change Frequency: Wound #17 Right,Dorsal Toe - Web between 1st and 2nd: Change Dressing every other day. Wound #24 Left,Dorsal Foot: Change Dressing every other day. Wound #35 Left Ischium: Change Dressing every other day. Skin Barriers/Peri-Wound Care: Antifungal cream - on toes on both feet daily Moisturizing lotion - both legs Other: - Triamcinolone cream Wound #35 Left Ischium: Barrier cream Wound Cleansing: Wound #17 Right,Dorsal Toe - Web between 1st and 2nd: Clean wound with Wound Cleanser Primary Wound Dressing: Wound #17 Right,Dorsal Toe - Web between 1st and 2nd: Calcium Alginate with Silver Wound #24 Left,Dorsal Foot: Calcium Alginate with Silver Wound #35 Left Ischium: Hydrofera Blue Secondary Dressing: Wound #17 Right,Dorsal Toe - Web between 1st and 2nd: Kerlix/Rolled Gauze Dry Gauze Wound #24 Left,Dorsal Foot: Kerlix/Rolled Gauze Dry Gauze Heel Cup -  add heel cup to left heel for protection. Wound #35 Left Ischium: Foam Border Edema Control: Elevate legs to the level of the heart or above for 30 minutes daily and/or when sitting, a frequency of: - throughout the day Support Garment 30-40 mm/Hg  pressure to: - Juxtalite to both legs. Off-Loading: Low air-loss mattress (Group 2) Roho cushion for wheelchair Turn and reposition every 2 hours - out of wheelchair throughout the day, try to lay on sides, sleep in the bed not the recliner Laboratory ordered were: Biopsy specimen culture - left dorsal foot 1. Still using silver alginate to wounds on both areas on both feet 2. Await pathology on the deteriorating wound on the left dorsal foot 3. Continue Hydrofera Blue on the buttock wounds. Electronic Signature(s) Signed: 10/30/2019 10:01:17 AM By: Linton Ham MD Entered By: Linton Ham on 10/29/2019 08:44:06 -------------------------------------------------------------------------------- SuperBill Details Patient Name: Date of Service: CRIBBNoahjames, Bayliss 10/29/2019 Medical Record [id number] Patient Account Number: [id number] Date of Birth/Sex: Treating RN: [birth year] (33 y.o. M) Primary Care Provider: [place], [place] Other Clinician: Referring Provider: Treating Provider/Extender:Maeli Spacek, [name], [name]: 199 Diagnosis Coding ICD-10 Codes Code Description L97.511 Non-pressure chronic ulcer of other part of right 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 L97.521 Non-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, complete 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, stage 3 Facility Procedures Physician Procedures CPT4 Code Description: [id number] - WC PHYS SUBQ TISS 20 SQ CM ICD-10 Diagnosis Description L97.521 Non-pressure chronic ulcer of other part of left foot lim Modifier: ited to breakdo Quantity: 1 wn of skin Electronic Signature(s) Signed: 10/30/2019 10:01:17 AM By: Linton Ham MD Entered By: Linton Ham on 10/29/2019 08:44:20

## 2019-10-30 NOTE — Progress Notes (Signed)
CRIBBJacieon, Shortino ([id number]) Visit Report for 08/20/2019 Arrival Information Details Patient Name: Date of Service: [name], [name] 08/20/2019 7:30 AM Medical Record [id number] Patient Account Number: [id number] Date of Birth/Sex: Treating RN: Sep 28, 1986 (33 y.o. [name]) Carlene Coria Primary Care Barrett Goldie: [place], [place] Other Clinician: Referring Prisma Decarlo: Treating Darvell Monteforte/Extender:Robson, [name], [name]: 189 Visit Information History Since Last Visit All ordered tests and consults were completed: No Patient Arrived: Wheel Chair Added or deleted any medications: No Arrival Time: 07:55 Any new allergies or adverse reactions: No Accompanied By: self Had a fall or experienced change in No activities of daily living that may affect Transfer Assistance: None risk of falls: Patient Identification Verified: Yes Signs or symptoms of abuse/neglect since last No Secondary Verification Process Completed: Yes visito Patient Requires Transmission-Based No Hospitalized since last visit: No Precautions: Implantable device outside of the clinic excluding No Patient Has Alerts: Yes cellular tissue based products placed in the center Patient Alerts: R ABI = since last visit: 1.0 Has Dressing in Place as Prescribed: Yes L ABI = Has Compression in Place as Prescribed: Yes 1.1 Pain Present Now: No Electronic Signature(s) Signed: 10/30/2019 2:58:33 PM By: Carlene Coria RN Entered By: Carlene Coria on 08/20/2019 07:56:20 -------------------------------------------------------------------------------- Encounter Discharge Information Details Patient Name: Date of Service: [name] 08/20/2019 7:30 AM Medical Record [id number] Patient Account Number: [id number] Date of Birth/Sex: Treating RN: Sep 23, 1986 (33 y.o. [name] Primary Care Jerell Demery: [place], [place] Other Clinician: Referring Philicia Heyne: Treating Kimberlynn Lumbra/Extender:Robson, [name],  [name]: 189 Encounter Discharge Information Items Post Procedure Vitals Discharge Condition: Stable Temperature (F): 98.3 Ambulatory Status: Wheelchair Pulse (bpm): 117 Discharge Destination: Home Respiratory Rate (breaths/min): 18 Transportation: Private Auto Blood Pressure (mmHg): 121/72 Accompanied By: self Schedule Follow-up Appointment: Yes Clinical Summary of Care: Electronic Signature(s) Signed: 08/20/2019 5:51:45 PM By: Deon Pilling Entered By: Deon Pilling on 08/20/2019 08:59:15 -------------------------------------------------------------------------------- Lower Extremity Assessment Details Patient Name: Date of Service: [name], [name] 08/20/2019 7:30 AM Medical Record [id number] Patient Account Number: [id number] Date of Birth/Sex: Treating RN: 1986/02/19 (33 y.o. [name] Primary Care Analayah Brooke: [place], [place] Other Clinician: Referring [name]: Treating Rajan Burgard/Extender:Robson, [name], [name]: 189 Edema Assessment Assessed: [Left: No] [Right: No] Edema: [Left: No] [Right: Yes] Calf Left: Right: Point of Measurement: 33 cm From Medial Instep 32 cm 35 cm Ankle Left: Right: Point of Measurement: 10 cm From Medial Instep 23 cm 23 cm Electronic Signature(s) Signed: 10/30/2019 2:58:33 PM By: Carlene Coria RN Entered By: Carlene Coria on 08/20/2019 08:13:01 -------------------------------------------------------------------------------- Multi Wound Chart Details Patient Name: Date of Service: [name] 08/20/2019 7:30 AM Medical Record [id number] Patient Account Number: [id number] Date of Birth/Sex: Treating RN: 08-16-1986 (33 y.o. [name] Primary Care Yaniah Thiemann: [name], [name] Other Clinician: Referring Alita Waldren: Treating [name]/Extender:Robson, [name], [name]: 189 Vital Signs Height(in): 70 Pulse(bpm): 117 Weight(lbs): 216 Blood Pressure(mmHg):  121/72 Body Mass Index(BMI): 31 Temperature(F): 98.3 Respiratory 18 Rate(breaths/min): Photos: [17:No Photos] [24:No Photos] [35:No Photos] Wound Location: [17:Right Toe - Web between Left Foot - Dorsal 1st and 2nd - Dorsal] [35:Left Ischium] Wounding Event: [17:Gradually Appeared] [24:Gradually Appeared] [35:Gradually Appeared] Primary Etiology: [17:Atypical] [24:Inflammatory] [35:Pressure Ulcer] Comorbid History: [17:Sleep Apnea, Hypertension, Sleep Apnea, Hypertension, Sleep Apnea, Hypertension, Paraplegia] [24:Paraplegia] [35:Paraplegia] Date Acquired: [17:04/07/2017] [24:03/08/2018] [35:04/28/2019] Weeks of Treatment: [id number] [24:75] [35:16] Wound Status: [17:Open] [24:Open] [35:Open] Clustered Wound: [17:No] [24:No] [35:Yes] Clustered Quantity: [17:N/A] [24:N/A] [35:1] Measurements L x W x D 4x0.2x0.1 [24:1.6x1.7x0.1] [35:1.7x0.4x0.1] (cm) Area (cm) : [  17:0.628] [24:2.136] [35:0.534] Volume (cm) : [17:0.063] [24:0.214] [35:0.053] % Reduction in Area: [17:-6.60%] [24:-805.10%] [35:96.20%] % Reduction in Volume: -6.80% [24:-791.70%] [35:96.30%] Classification: [17:Full Thickness Without Exposed Support Structures Exposed Support Structures] [24:Full Thickness Without] [35:Category/Stage II] Exudate Amount: [17:Medium] [24:Medium] [35:Medium] Exudate Type: [17:Serosanguineous] [24:Serosanguineous] [35:Serosanguineous] Exudate Color: [17:red, brown] [24:red, brown] [35:red, brown] Wound Margin: [17:Distinct, outline attached Distinct, outline attached Flat and Intact] Granulation Amount: [17:Large (67-100%)] [24:Large (67-100%)] [35:Large (67-100%)] Granulation Quality: [17:Red, Pink] [24:Pink] [35:Red, Friable] Necrotic Amount: [17:Small (1-33%)] [24:Small (1-33%)] [35:None Present (0%)] Exposed Structures: [17:Fat Layer (Subcutaneous Fat Layer (Subcutaneous Fat Layer (Subcutaneous Tissue) Exposed: Yes Fascia: No Tendon: No Muscle: No Joint: No Bone: No] [24:Tissue) Exposed: Yes  Fascia: No Tendon: No Muscle: No Joint: No Bone: No] [35:Tissue) Exposed: Yes  Fascia: No Tendon: No Muscle: No Joint: No Bone: No] Epithelialization: [17:Large (67-100%)] [24:Large (67-100%)] [35:Medium (34-66%)] Debridement: [17:N/A] [24:Debridement - Excisional N/A] Pre-procedure [17:N/A] [24:08:33] [35:N/A] Verification/Time Out Taken: Tissue Debrided: [17:N/A] [24:Callus, Subcutaneous] [35:N/A] Level: [17:N/A] [24:Skin/Subcutaneous Tissue N/A] Debridement Area (sq cm):N/A [24:2.72] [35:N/A] Instrument: [17:N/A] [24:Curette] [35:N/A] Bleeding: [17:N/A] [24:Minimum] [35:N/A] Procedural Pain: [17:N/A] [24:0] [35:N/A] Post Procedural Pain: [17:N/A] [24:0] [35:N/A] Debridement Treatment N/A [24:Procedure was tolerated] [35:N/A] Response: [24:well] Post Debridement [17:N/A] [24:1.6x1.7x0.1] [35:N/A] Measurements L x W x D (cm) Post Debridement [17:N/A] [24:0.214 N/A] Volume: (cm) Procedures Performed: [17:N/A] [24:Debridement N/A] Treatment Notes Wound #17 (Right, Dorsal Toe - Web between 1st and 2nd) 1. Cleanse With Wound Cleanser Soap and water 2. Periwound Care Antifungal cream Moisturizing lotion TCA Cream 3. Primary Dressing Applied Calcium Alginate Ag 4. Secondary Dressing Dry Gauze Roll Gauze 5. Secured With Tape Notes heel cup to left heel. Wound #24 (Left, Dorsal Foot) 1. Cleanse With Wound Cleanser Soap and water 2. Periwound Care Antifungal cream Moisturizing lotion TCA Cream 3. Primary Dressing Applied Calcium Alginate Ag 4. Secondary Dressing Dry Gauze Roll Gauze 5. Secured With Tape Notes heel cup to left heel. Wound #35 (Left Ischium) 1. Cleanse With Wound Cleanser 2. Periwound Care Skin Prep 3. Primary Dressing Applied Hydrofera Blue 4. Secondary Dressing Foam 5. Secured With Office manager) Signed: 08/20/2019 6:01:32 PM By: Linton Ham MD Signed: 08/20/2019 6:25:06 PM By: Levan Hurst RN,  BSN Entered By: Linton Ham on 08/20/2019 09:42:17 -------------------------------------------------------------------------------- Strang Details Patient Name: Date of Service: [name], [name] 08/20/2019 7:30 AM Medical Record [id number] Patient Account Number: [id number] Date of Birth/Sex: Treating RN: 20-Oct-1986 (33 y.o. [name] Primary Care Lorilee Cafarella: [name], [name] Other Clinician: Referring Shakeena Kafer: Treating Paulyne Mooty/Extender:Robson, [name], [name]: 189 Active Inactive Wound/Skin Impairment Nursing Diagnoses: Impaired tissue integrity Knowledge deficit related to ulceration/compromised skin integrity Goals: Patient/caregiver will verbalize understanding of skin care regimen Date Initiated: 01/05/2016 Target Resolution Date: 09/21/2019 Goal Status: Active Ulcer/skin breakdown will have a volume reduction of 30% by week 4 Date Initiated: 01/05/2016 Date Inactivated: 12/22/2017 Target Resolution Date: 01/19/2018 Unmet Reason: complex Goal Status: Unmet wounds, infection Interventions: Assess patient/caregiver ability to obtain necessary supplies Assess ulceration(s) every visit Provide education on ulcer and skin care Notes: Electronic Signature(s) Signed: 08/20/2019 6:25:06 PM By: Levan Hurst RN, BSN Entered By: Levan Hurst on 08/20/2019 07:57:57 -------------------------------------------------------------------------------- Pain Assessment Details Patient Name: Date of Service: [name] 08/20/2019 7:30 AM Medical Record [id number] Patient Account Number: [id number] Date of Birth/Sex: Treating RN: 1986-07-29 (33 y.o. [name] Primary Care Olamae Ferrara: [name], [name] Other Clinician: Referring Laurena Valko: Treating Harbour Nordmeyer/Extender:Robson, [name], [name]: 189 Active Problems Location of Pain  Severity and Description of Pain Patient Has Paino No Site  Locations Pain Management and Medication Current Pain Management: Electronic Signature(s) Signed: 10/30/2019 2:58:33 PM By: Carlene Coria RN Entered By: Carlene Coria on 08/20/2019 07:57:22 -------------------------------------------------------------------------------- Patient/Caregiver Education Details Patient Name: Date of Service: [name] 9/28/2020andnbsp7:30 AM Medical Record [id number] Patient Account Number: [id number] Date of Birth/Gender: 07-28-1986 (33 y.o. M) Treating RN: Levan Hurst Primary Care Physician: Janine Limbo Other Clinician: Referring Physician: Treating Physician/Extender:Robson, Pecola Leisure in Treatment: 95 Education Assessment Education Provided To: Patient Education Topics Provided Wound/Skin Impairment: Methods: Explain/Verbal Responses: State content correctly Electronic Signature(s) Signed: 08/20/2019 6:25:06 PM By: Levan Hurst RN, BSN Entered By: Levan Hurst on 08/20/2019 08:00:32 -------------------------------------------------------------------------------- Wound Assessment Details Patient Name: Date of Service: [name] 08/20/2019 7:30 AM Medical Record [id number] Patient Account Number: [id number] Date of Birth/Sex: Treating RN: June 01, 1986 (33 y.o. [name] Primary Care Gerardine Peltz: [place], [place] Other Clinician: Referring Zaiah Eckerson: Treating Marilyn Nihiser/Extender:Robson, [name], [name]: 189 Wound Status Wound Number: 17 Primary Etiology: Atypical Wound Location: Right Toe - Web between 1st and Wound Status: Open 2nd - Dorsal Comorbid Sleep Apnea, Hypertension, Wounding Event: Gradually Appeared History: Paraplegia Date Acquired: 04/07/2017 Weeks Of Treatment: 123 Clustered Wound: No Photos Wound Measurements Length: (cm) 4 % Reduct Width: (cm) 0.2 % Reduct Depth: (cm) 0.1 Epitheli Area: (cm) 0.628 Tunneli Volume: (cm) 0.063 Undermi Wound  Description Full Thickness Without Exposed Support Foul Odo Classification: Structures Slough/F Wound Distinct, outline attached Margin: Exudate Medium Amount: Exudate Serosanguineous Type: Exudate red, brown Color: Wound Bed Granulation Amount: Large (67-100%) Granulation Quality: Red, Pink Fascia E Necrotic Amount: Small (1-33%) Fat Laye Necrotic Quality: Adherent Slough Tendon E Muscle E Joint Ex Bone Exp r After Cleansing: No ibrino Yes Exposed Structure xposed: No r (Subcutaneous Tissue) Exposed: Yes xposed: No xposed: No posed: No osed: No ion in Area: -6.6% ion in Volume: -6.8% alization: Large (67-100%) ng: No ning: No Electronic Signature(s) Signed: 08/22/2019 8:44:12 AM By: Mikeal Hawthorne EMT/HBOT Signed: 10/30/2019 2:58:33 PM By: Carlene Coria RN Entered By: Mikeal Hawthorne on 08/21/2019 10:47:32 -------------------------------------------------------------------------------- Wound Assessment Details Patient Name: Date of Service: [name] 08/20/2019 7:30 AM Medical Record [id number] Patient Account Number: [id number] Date of Birth/Sex: Treating RN: 1986/03/07 (33 y.o. [name] Primary Care Tobby Fawcett: [place], [place] Other Clinician: Referring Shameer Molstad: Treating Walda Hertzog/Extender:Robson, [name], [name]: 189 Wound Status Wound Number: 24 Primary Etiology: Inflammatory Wound Location: Left Foot - Dorsal Wound Status: Open Wounding Event: Gradually Appeared Comorbid Sleep Apnea, Hypertension, History: Paraplegia Date Acquired: 03/08/2018 Weeks Of Treatment: 75 Clustered Wound: No Photos Wound Measurements Length: (cm) 1.6 % Reduct Width: (cm) 1.7 % Reduct Depth: (cm) 0.1 Epitheli Area: (cm) 2.136 Tunneli Volume: (cm) 0.214 Undermi Wound Description Full Thickness Without Exposed Support Foul Odo Classification: Structures Slough/F Wound Wound Distinct, outline attached Margin: Exudate  Medium Amount: Exudate Serosanguineous Type: Exudate red, brown Color: Wound Bed Granulation Amount: Large (67-100%) Granulation Quality: Pink Fascia Ex Necrotic Amount: Small (1-33%) Fat Layer Necrotic Quality: Adherent Slough Tendon Ex Muscle Ex Joint Exp Bone Expo r After Cleansing: No ibrino Yes Exposed Structure posed: No (Subcutaneous Tissue) Exposed: Yes posed: No posed: No osed: No sed: No ion in Area: -805.1% ion in Volume: -791.7% alization: Large (67-100%) ng: No ning: No Electronic Signature(s) Signed: 08/22/2019 8:44:12 AM By: Mikeal Hawthorne EMT/HBOT Signed: 10/30/2019 2:58:33 PM By: Carlene Coria RN Entered By: Mikeal Hawthorne on 08/21/2019 10:51:11 -------------------------------------------------------------------------------- Wound Assessment Details Patient Name:  Date of Service: [name], [name] 08/20/2019 7:30 AM Medical Record [id number] Patient Account Number: [id number] Date of Birth/Sex: Treating RN: 1986-03-11 (33 y.o. [name]) Carlene Coria Primary Care Shayma Pfefferle: [place], [place] Other Clinician: Referring Gustava Berland: Treating Landan Fedie/Extender:Robson, [name], [name]: 189 Wound Status Wound Number: 35 Primary Etiology: Pressure Ulcer Wound Location: Left Ischium Wound Status: Open Wounding Event: Gradually Appeared Comorbid Sleep Apnea, Hypertension, History: Paraplegia Date Acquired: 04/28/2019 Weeks Of Treatment: 16 Clustered Wound: Yes Photos Wound Measurements Length: (cm) 1.7 Width: (cm) 0.4 Depth: (cm) 0.1 Clustered Quantity: 1 Area: (cm) 0.534 Volume: (cm) 0.053 Wound Description Classification: Category/Stage II Wound Margin: Flat and Intact Exudate Amount: Medium Exudate Type: Serosanguineous Exudate Color: red, brown Wound Bed Granulation Amount: Large (67-100%) Granulation Quality: Red, Friable Necrotic Amount: None Present (0%) After Cleansing: No rino No Exposed Structure sed:  No Subcutaneous Tissue) Exposed: Yes sed: No sed: No ed: No d: No % Reduction in Area: 96.2% % Reduction in Volume: 96.3% Epithelialization: Medium (34-66%) Tunneling: No Undermining: No Foul Odor Slough/Fib Fascia Expo Fat Layer ( Tendon Expo Muscle Expo Joint Expos Bone Expose Electronic Signature(s) Signed: 08/22/2019 8:44:12 AM By: Mikeal Hawthorne EMT/HBOT Signed: 10/30/2019 2:58:33 PM By: Carlene Coria RN Entered By: Mikeal Hawthorne on 08/21/2019 10:53:50 -------------------------------------------------------------------------------- Vitals Details Patient Name: Date of Service: [name] 08/20/2019 7:30 AM Medical Record [id number] Patient Account Number: [id number] Date of Birth/Sex: Treating RN: [birth year] (33 y.o. [name] Primary Care Jamaia Brum: [place], [place] Other Clinician: Referring Elyse Prevo: Treating Konnie Noffsinger/Extender:Robson, [name], [name]: 189 Vital Signs Time Taken: 07:56 Temperature (F): 98.3 Height (in): 70 Pulse (bpm): 117 Weight (lbs): 216 Respiratory Rate (breaths/min): 18 Body Mass Index (BMI): 31 Blood Pressure (mmHg): 121/72 Reference Range: 80 - 120 mg / dl Electronic Signature(s) Signed: 10/30/2019 2:58:33 PM By: Carlene Coria RN Entered By: Carlene Coria on 08/20/2019 07:57:14

## 2019-10-31 NOTE — Progress Notes (Signed)
Chismar, KALEM NANKERVIS ([id number]) Visit Report for 09/24/2019 Arrival Information Details Patient Name: Date of Service: [name], [name] 09/24/2019 7:30 AM Medical Record [id number] Patient Account Number: [id number] Date of Birth/Sex: Treating RN: 1986/05/20 (33 y.o. [name]) Carlene Coria Primary Care Valory Wetherby: [place], [place] Other Clinician: Referring Nichola Warren: Treating Trevionne Advani/Extender:Robson, [name], [name]: 194 Visit Information History Since Last Visit All ordered tests and consults were completed: No Patient Arrived: Wheel Chair Added or deleted any medications: No Arrival Time: 07:53 Any new allergies or adverse reactions: No Accompanied By: self Had a fall or experienced change in No activities of daily living that may affect Transfer Assistance: None risk of falls: Patient Identification Verified: Yes Signs or symptoms of abuse/neglect since last No Secondary Verification Process Completed: Yes visito Patient Requires Transmission-Based No Hospitalized since last visit: No Precautions: Implantable device outside of the clinic excluding No Patient Has Alerts: Yes cellular tissue based products placed in the center Patient Alerts: R ABI = since last visit: 1.0 Has Dressing in Place as Prescribed: Yes L ABI = Has Compression in Place as Prescribed: Yes 1.1 Pain Present Now: No Electronic Signature(s) Signed: 10/31/2019 12:05:18 PM By: Carlene Coria RN Entered By: Carlene Coria on 09/24/2019 07:54:13 -------------------------------------------------------------------------------- Encounter Discharge Information Details Patient Name: Date of Service: [name], [name] 09/24/2019 7:30 AM Medical Record [id number] Patient Account Number: [id number] Date of Birth/Sex: Treating RN: Oct 11, 1986 (33 y.o. [name] Primary Care Kailand Seda: [place], [place] Other Clinician: Referring Klover Priestly: Treating Chayanne Speir/Extender:Robson, [name],  [name]: 194 Encounter Discharge Information Items Post Procedure Vitals Discharge Condition: Stable Temperature (F): 98 Ambulatory Status: Walker Pulse (bpm): 128 Discharge Destination: Home Respiratory Rate (breaths/min): 18 Transportation: Private Auto Blood Pressure (mmHg): 144/86 Accompanied By: self Schedule Follow-up Appointment: Yes Clinical Summary of Care: Electronic Signature(s) Signed: 10/31/2019 12:05:18 PM By: Carlene Coria RN Entered By: Carlene Coria on 09/24/2019 08:48:52 -------------------------------------------------------------------------------- Lower Extremity Assessment Details Patient Name: Date of Service: [name], [name] 09/24/2019 7:30 AM Medical Record [id number] Patient Account Number: [id number] Date of Birth/Sex: Treating RN: July 10, 1986 (33 y.o. [name] Primary Care Doyce Stonehouse: [place], [place] Other Clinician: Referring Georgeann Brinkman: Treating Monice Lundy/Extender:Robson, [name], [name]: 194 Edema Assessment Assessed: [Left: No] [Right: No] Edema: [Left: No] [Right: No] Calf Left: Right: Point of Measurement: 33 cm From Medial Instep 28 cm 32 cm Ankle Left: Right: Point of Measurement: 10 cm From Medial Instep 22.5 cm 23 cm Electronic Signature(s) Signed: 10/31/2019 12:05:18 PM By: Carlene Coria RN Entered By: Carlene Coria on 09/24/2019 [id number] -------------------------------------------------------------------------------- Multi Wound Chart Details Patient Name: Date of Service: Baity, [name] 09/24/2019 7:30 AM Medical Record [id number] Patient Account Number: [id number] Date of Birth/Sex: Treating RN: 08-May-1986 (33 y.o. [name] Primary Care Ahja Martello: [name], [name] Other Clinician: Referring Juliocesar Blasius: Treating Luis Nickles/Extender:Robson, [name], [name]: 194 Vital Signs Height(in): 70 Pulse(bpm): 128 Weight(lbs): 216 Blood Pressure(mmHg): 144/86 Body  Mass Index(BMI): 31 Temperature(F): 98 Respiratory 18 Rate(breaths/min): Photos: [17:No Photos] [24:No Photos] [35:No Photos] Wound Location: [17:Right Toe - Web between Left Foot - Dorsal 1st and 2nd - Dorsal] [35:Left Ischium] Wounding Event: [17:Gradually Appeared] [24:Gradually Appeared] [35:Gradually Appeared] Primary Etiology: [17:Atypical] [24:Inflammatory] [35:Pressure Ulcer] Comorbid History: [17:Sleep Apnea, Hypertension, Sleep Apnea, Hypertension, Sleep Apnea, Hypertension, Paraplegia] [24:Paraplegia] [35:Paraplegia] Date Acquired: [17:04/07/2017] [24:03/08/2018] [35:04/28/2019] Weeks of Treatment: [id number] [24:80] [35:21] Wound Status: [17:Open] [24:Open] [35:Open] Clustered Wound: [17:Yes] [24:No] [35:Yes] Clustered Quantity: [17:2] [24:N/A] [35:2] Measurements L x W x D 2x3.4x0.1 [24:2.3x3.1x0.2] [35:5x2.7x0.2] (cm) Area (  cm) : [17:5.341] [24:5.6] [id number] Volume (cm) : [17:0.534] [24:1.12] [35:2.121] % Reduction in Area: [17:-806.80%] [24:-2272.90%] [35:25.00%] % Reduction in Volume: -805.10% [24:-4566.70%] [35:-50.00%] Classification: [17:Full Thickness Without Exposed Support Structures Exposed Support Structures] [24:Full Thickness Without] [35:Category/Stage III] Exudate Amount: [17:Medium] [24:Medium] [35:Medium] Exudate Type: [17:Serosanguineous] [24:Serosanguineous] [35:Serosanguineous] Exudate Color: [17:red, brown] [24:red, brown] [35:red, brown] Wound Margin: [17:Distinct, outline attached Distinct, outline attached Flat and Intact] Granulation Amount: [17:Small (1-33%)] [24:Small (1-33%)] [35:Large (67-100%)] Granulation Quality: [17:Pale] [24:Pale] [35:Red, Friable] Necrotic Amount: [17:Large (67-100%)] [24:Large (67-100%)] [35:Small (1-33%)] Exposed Structures: [17:Fat Layer (Subcutaneous Fat Layer (Subcutaneous Fat Layer (Subcutaneous Tissue) Exposed: Yes Fascia: No Tendon: No Muscle: No Joint: No Bone: No] [24:Tissue) Exposed: Yes Fascia: No Tendon: No  Muscle: No Joint: No Bone: No] [35:Tissue) Exposed: Yes  Fascia: No Tendon: No Muscle: No Joint: No Bone: No] Epithelialization: [17:None] [24:None] [35:None] Debridement: [17:N/A] [24:Debridement - Excisional N/A] Pre-procedure [17:N/A] [24:08:28] [35:N/A] Verification/Time Out Taken: Tissue Debrided: [17:N/A] [24:Subcutaneous, Slough] [35:N/A] Level: [17:N/A] [24:Skin/Subcutaneous Tissue N/A] Debridement Area (sq cm):N/A [24:7.13] [35:N/A] Instrument: [17:N/A] [24:Curette] [35:N/A] Bleeding: [17:N/A] [24:Minimum] [35:N/A] Procedural Pain: [17:N/A] [24:0] [35:N/A] Post Procedural Pain: [17:N/A] [24:0] [35:N/A] Debridement Treatment N/A [24:Procedure was tolerated] [35:N/A] Response: [24:well] Post Debridement [17:N/A] [24:2.3x3.1x0.1] [35:N/A] Measurements L x W x D (cm) Post Debridement [17:N/A] [24:0.56] [35:N/A] Volume: (cm) Procedures Performed: [17:N/A] [24:Debridement] [35:N/A] Treatment Notes Electronic Signature(s) Signed: 09/24/2019 5:57:33 PM By: Linton Ham MD Signed: 09/24/2019 6:00:40 PM By: Levan Hurst RN, BSN Entered By: Linton Ham on 09/24/2019 08:41:10 -------------------------------------------------------------------------------- Multi-Disciplinary Care Plan Details Patient Name: Date of Service: [name] Nicely. 09/24/2019 7:30 AM Medical Record [id number] Patient Account Number: [id number] Date of Birth/Sex: Treating RN: [birth year] (33 y.o. [name] Primary Care Jariah Tarkowski: [place], [place] Other Clinician: Referring Raini Tiley: Treating Kaleiah Kutzer/Extender:Robson, [name], [name]: 194 Active Inactive Wound/Skin Impairment Nursing Diagnoses: Impaired tissue integrity Knowledge deficit related to ulceration/compromised skin integrity Goals: Patient/caregiver will verbalize understanding of skin care regimen Date Initiated: 01/05/2016 Target Resolution Date: 10/19/2019 Goal Status: Active Ulcer/skin  breakdown will have a volume reduction of 30% by week 4 Date Initiated: 01/05/2016 Date Inactivated: 12/22/2017 Target Resolution Date: 01/19/2018 Unmet Reason: complex Goal Status: Unmet wounds, infection Interventions: Assess patient/caregiver ability to obtain necessary supplies Assess ulceration(s) every visit Provide education on ulcer and skin care Notes: Electronic Signature(s) Signed: 09/27/2019 5:16:49 PM By: Kela Millin Entered By: Kela Millin on 09/24/2019 07:52:08 -------------------------------------------------------------------------------- Pain Assessment Details Patient Name: Date of Service: [name], [name] 09/24/2019 7:30 AM Medical Record [id number] Patient Account Number: [id number] Date of Birth/Sex: Treating RN: [birth year] (33 y.o. [name] Primary Care Qamar Rosman: [place], [place] Other Clinician: Referring Jocelin Schuelke: Treating Kaspar Albornoz/Extender:Robson, [name], [name]: 194 Active Problems Location of Pain Severity and Description of Pain Patient Has Paino No Site Locations Pain Management and Medication Current Pain Management: Electronic Signature(s) Signed: 10/31/2019 12:05:18 PM By: Carlene Coria RN Entered By: Carlene Coria on 09/24/2019 07:54:51 -------------------------------------------------------------------------------- Patient/Caregiver Education Details Patient Name: Date of Service: CRIBBGrace Bushy 11/2/2020andnbsp7:30 AM Medical Record [id number] Patient Account Number: [id number] Date of Birth/Gender: May 09, 1986 (33 y.o. M) Treating RN: Kela Millin Primary Care Physician: Janine Limbo Other Clinician: Referring Physician: Treating Physician/Extender:Robson, Pecola Leisure in Treatment: 24 Education Assessment Education Provided To: Patient Education Topics Provided Wound/Skin Impairment: Methods: Explain/Verbal Responses: State content correctly Electronic  Signature(s) Signed: 09/27/2019 5:16:49 PM By: Kela Millin Entered By: Kela Millin on 09/24/2019 07:52:31 -------------------------------------------------------------------------------- Wound Assessment Details Patient Name: Date of Service: [name], [name] 09/24/2019 7:30  AM Medical Record [id number] Patient Account Number: [id number] Date of Birth/Sex: Treating RN: 22-Jan-1986 (33 y.o. [name]) Carlene Coria Primary Care Kallie Depolo: [place], [place] Other Clinician: Referring Mordecai Tindol: Treating Kasir Hallenbeck/Extender:Robson, [name], [name]: 194 Wound Status Wound Number: 17 Primary Etiology: Atypical Wound Location: Right Toe - Web between 1st and Wound Status: Open 2nd - Dorsal Comorbid Sleep Apnea, Hypertension, Wounding Event: Gradually Appeared History: Paraplegia Date Acquired: 04/07/2017 Weeks Of Treatment: 128 Clustered Wound: Yes Photos Wound Measurements Length: (cm) 2 % Reduct Width: (cm) 3.4 % Reduct Depth: (cm) 0.1 Epitheli Clustered Quantity: 2 Tunnelin Area: (cm) 5.341 Undermi Volume: (cm) 0.534 Wound Description Full Thickness Without Exposed Support Foul Od Classification: Structures Classification: Structures Slough/ Wound Distinct, outline attached Margin: Exudate Medium Amount: Exudate Serosanguineous Type: Exudate red, brown Color: Wound Bed Granulation Amount: Small (1-33%) Granulation Quality: Pale Fascia E Necrotic Amount: Large (67-100%) Fat Laye Necrotic Quality: Adherent Slough Tendon E Muscle E Joint Ex Bone Exp or After Cleansing: No Fibrino Yes Exposed Structure xposed: No r (Subcutaneous Tissue) Exposed: Yes xposed: No xposed: No posed: No osed: No ion in Area: -806.8% ion in Volume: -805.1% alization: None g: No ning: No Electronic Signature(s) Signed: 09/26/2019 3:21:07 PM By: Mikeal Hawthorne EMT/HBOT Signed: 10/31/2019 12:05:18 PM By: Carlene Coria RN Entered By: Mikeal Hawthorne on  09/25/2019 11:28:44 -------------------------------------------------------------------------------- Wound Assessment Details Patient Name: Date of Service: [name], [name] 09/24/2019 7:30 AM Medical Record [id number] Patient Account Number: [id number] Date of Birth/Sex: Treating RN: 01/11/1986 (33 y.o. [name] Primary Care Jennye Runquist: [place], [place] Other Clinician: Referring Donna Silverman: Treating Paulmichael Schreck/Extender:Robson, [name], [name]: 194 Wound Status Wound Number: 24 Primary Etiology: Inflammatory Wound Location: Left Foot - Dorsal Wound Status: Open Wounding Event: Gradually Appeared Comorbid Sleep Apnea, Hypertension, History: Paraplegia Date Acquired: 03/08/2018 Weeks Of Treatment: 80 Clustered Wound: No Photos Wound Measurements Length: (cm) 2.3 % Reduct Width: (cm) 3.1 % Reduct Depth: (cm) 0.2 Epitheli Area: (cm) 5.6 Tunneli Volume: (cm) 1.12 Undermi Wound Description Classification: Full Thickness Without Exposed Support Foul Odo Structures Slough/F Wound Distinct, outline attached Margin: Exudate Medium Amount: Exudate Serosanguineous Type: Exudate red, brown Color: Wound Bed Granulation Amount: Small (1-33%) Granulation Quality: Pale Fascia E Necrotic Amount: Large (67-100%) Fat Laye Necrotic Quality: Adherent Slough Tendon E Muscle E Joint Ex Bone Exp r After Cleansing: No ibrino Yes Exposed Structure xposed: No r (Subcutaneous Tissue) Exposed: Yes xposed: No xposed: No posed: No osed: No ion in Area: -2272.9% ion in Volume: -4566.7% alization: None ng: No ning: No Electronic Signature(s) Signed: 09/26/2019 3:21:07 PM By: Mikeal Hawthorne EMT/HBOT Signed: 10/31/2019 12:05:18 PM By: Carlene Coria RN Entered By: Mikeal Hawthorne on 09/25/2019 11:29:30 -------------------------------------------------------------------------------- Wound Assessment Details Patient Name: Date of Service: [name], [name]  09/24/2019 7:30 AM Medical Record [id number] Patient Account Number: [id number] Date of Birth/Sex: Treating RN: [birth year] (33 y.o. [name] Primary Care Danaiya Steadman: [place], [place] Other Clinician: Referring Shatera Rennert: Treating Renold Kozar/Extender:Robson, [name], [name]: 194 Wound Status Wound Number: 35 Primary Etiology: Pressure Ulcer Wound Location: Left Ischium Wound Status: Open Wounding Event: Gradually Appeared Comorbid Sleep Apnea, Hypertension, History: Paraplegia Date Acquired: 04/28/2019 Weeks Of Treatment: 21 Clustered Wound: Yes Photos Wound Measurements Length: (cm) 5 Width: (cm) 2.7 Depth: (cm) 0.2 Clustered Quantity: 2 Area: (cm) 10.603 Volume: (cm) 2.121 Wound Description Classification: Category/Stage III Wound Margin: Flat and Intact Exudate Amount: Medium Exudate Type: Serosanguineous Exudate Color: red, brown Wound Bed Granulation Amount: Large (67-100%) Granulation Quality: Red, Friable Necrotic  Amount: Small (1-33%) Necrotic Quality: Adherent Slough fter Cleansing: No ino Yes Exposed Structure ed: No ubcutaneous Tissue) Exposed: Yes ed: No ed: No d: No : No % Reduction in Area: 25% % Reduction in Volume: -50% Epithelialization: None Tunneling: No Undermining: No Foul Odor A Slough/Fibr Fascia Expos Fat Layer (S Tendon Expos Muscle Expos Joint Expose Bone Exposed Electronic Signature(s) Signed: 09/26/2019 3:21:07 PM By: Mikeal Hawthorne EMT/HBOT Signed: 10/31/2019 12:05:18 PM By: Carlene Coria RN Entered By: Mikeal Hawthorne on 09/25/2019 11:30:12 -------------------------------------------------------------------------------- Vitals Details Patient Name: Date of Service: [name], [name] 09/24/2019 7:30 AM Medical Record [id number] Patient Account Number: [id number] Date of Birth/Sex: Treating RN: [birth year] (33 y.o. [name] Primary Care Zackarey Holleman: [place], [place] Other Clinician: Referring  Stormi [name]: Treating Warrick Llera/Extender:Robson, [name], [name]: 194 Vital Signs Time Taken: 07:54 Temperature (F): 98 Height (in): 70 Pulse (bpm): 128 Weight (lbs): 216 Respiratory Rate (breaths/min): 18 Body Mass Index (BMI): 31 Blood Pressure (mmHg): 144/86 Reference Range: 80 - 120 mg / dl Electronic Signature(s) Signed: 10/31/2019 12:05:18 PM By: Carlene Coria RN Entered By: Carlene Coria on 09/24/2019 07:54:44

## 2019-10-31 NOTE — Progress Notes (Signed)
[name], [name] DAMM ([id number]) Visit Report for 10/01/2019 Arrival Information Details Patient Name: Date of Service: [name], [name] 10/01/2019 7:30 AM Medical Record [id number] Patient Account Number: [id number] Date of Birth/Sex: Treating RN: [birth year] (33 y.o. [name]) Carlene Coria Primary Care Louine Tenpenny: [name], [name] Other Clinician: Referring Lacharles Altschuler: Treating Maxamilian Amadon/Extender:Robson, [name], [name]: 195 Visit Information History Since Last Visit All ordered tests and consults were completed: No Patient Arrived: Wheel Chair Added or deleted any medications: No Arrival Time: 07:50 Any new allergies or adverse reactions: No Accompanied By: self Had a fall or experienced change in No activities of daily living that may affect Transfer Assistance: None risk of falls: Patient Identification Verified: Yes Signs or symptoms of abuse/neglect since last No Secondary Verification Process Completed: Yes visito Patient Requires Transmission-Based No Hospitalized since last visit: No Precautions: Implantable device outside of the clinic excluding No Patient Has Alerts: Yes cellular tissue based products placed in the center Patient Alerts: R ABI = since last visit: 1.0 Has Dressing in Place as Prescribed: Yes L ABI = Has Compression in Place as Prescribed: Yes 1.1 Pain Present Now: No Electronic Signature(s) Signed: 10/30/2019 2:53:59 PM By: Carlene Coria RN Entered By: Carlene Coria on 10/01/2019 07:53:08 -------------------------------------------------------------------------------- Encounter Discharge Information Details Patient Name: Date of Service: [name], [name] 10/01/2019 7:30 AM Medical Record [id number] Patient Account Number: [id number] Date of Birth/Sex: Treating RN: 05/13/1986 (33 y.o. [name] Primary Care Jalonda Antigua: [name], [name] Other Clinician: Referring Sadiel Mota: Treating Allannah Kempen/Extender:Robson, [name],  [name]: 195 Encounter Discharge Information Items Post Procedure Vitals Discharge Condition: Stable Temperature (F): 98 Ambulatory Status: Wheelchair Pulse (bpm): 126 Discharge Destination: Home Respiratory Rate (breaths/min): 18 Transportation: Private Auto Blood Pressure (mmHg): 125/65 Accompanied By: self Schedule Follow-up Appointment: Yes Clinical Summary of Care: Electronic Signature(s) Signed: 10/01/2019 3:33:55 PM By: Deon Pilling Entered By: Deon Pilling on 10/01/2019 08:37:45 -------------------------------------------------------------------------------- Lower Extremity Assessment Details Patient Name: Date of Service: [name], [name] 10/01/2019 7:30 AM Medical Record [id number] Patient Account Number: [id number] Date of Birth/Sex: Treating RN: [birth year] (33 y.o. [name] Primary Care Manveer Gomes: [place], [place] Other Clinician: Referring Rubert Frediani: Treating Alyzah Pelly/Extender:Robson, [name], [name]: 195 Edema Assessment Assessed: [Left: Yes] [Right: Yes] Edema: [Left: No] [Right: No] Calf Left: Right: Point of Measurement: 33 cm From Medial Instep 28 cm 32 cm Ankle Left: Right: Point of Measurement: 10 cm From Medial Instep 22.5 cm 23 cm Electronic Signature(s) Signed: 10/30/2019 2:53:59 PM By: Carlene Coria RN Entered By: Carlene Coria on 10/01/2019 08:03:09 -------------------------------------------------------------------------------- Multi Wound Chart Details Patient Name: Date of Service: [name], [name] 10/01/2019 7:30 AM Medical Record [id number] Patient Account Number: [id number] Date of Birth/Sex: Treating RN: 1986/11/03 (33 y.o. [name] Primary Care Trustin Chapa: [name], [name] Other Clinician: Referring Dustine Stickler: Treating [place]/Extender:Robson, [name], [name]: 195 Vital Signs Height(in): 70 Pulse(bpm): 126 Weight(lbs): 216 Blood Pressure(mmHg):  125/65 Body Mass Index(BMI): 31 Temperature(F): 98 Respiratory 18 Rate(breaths/min): Photos: [17:No Photos] [24:No Photos] [35:No Photos] Wound Location: [17:Right Toe - Web between Left, Dorsal Foot 1st and 2nd - Dorsal] [35:Left Ischium] Wounding Event: [17:Gradually Appeared] [24:Gradually Appeared] [35:Gradually Appeared] Primary Etiology: [17:Atypical] [24:Inflammatory] [35:Pressure Ulcer] Comorbid History: [17:Sleep Apnea, Hypertension, Sleep Apnea, Hypertension, Sleep Apnea, Hypertension, Paraplegia] [24:Paraplegia] [35:Paraplegia] Date Acquired: [17:04/07/2017] [24:03/08/2018] [35:04/28/2019] Weeks of Treatment: [id number] [24:81] [35:22] Wound Status: [17:Open] [24:Open] [35:Open] Clustered Wound: [17:Yes] [24:No] [35:Yes] Clustered Quantity: [17:2] [24:N/A] [35:2] Measurements L x W x D 4.5x6x0.1 [24:3x3.5x0.2] [35:4.5x2x0.1] (cm) Area (cm) : [  17:21.206] [24:8.247] [35:7.069] Volume (cm) : [17:2.121] [24:1.649] [id number] % Reduction in Area: [17:-3500.30%] [24:-3394.50%] [35:50.00%] % Reduction in Volume: -3494.90% [24:-6770.80%] [35:50.00%] Classification: [17:Full Thickness Without Exposed Support Structures Exposed Support Structures] [24:Full Thickness Without] [35:Category/Stage III] Exudate Amount: [17:Medium] [24:Medium] [35:Medium] Exudate Type: [17:Serosanguineous] [24:Serosanguineous] [35:Serosanguineous] Exudate Color: [17:red, brown] [24:red, brown] [35:red, brown] Wound Margin: [17:Distinct, outline attached Distinct, outline attached Flat and Intact] Granulation Amount: [17:Small (1-33%)] [24:Small (1-33%)] [35:Large (67-100%)] Granulation Quality: [17:Pale] [24:Pale] [35:Red, Friable] Necrotic Amount: [17:Large (67-100%)] [24:Large (67-100%)] [35:Small (1-33%)] Exposed Structures: [17:Fat Layer (Subcutaneous Fat Layer (Subcutaneous Fat Layer (Subcutaneous Tissue) Exposed: Yes Fascia: No Tendon: No Muscle: No Joint: No Bone: No] [24:Tissue) Exposed: Yes Fascia:  No Tendon: No Muscle: No Joint: No Bone: No] [35:Tissue) Exposed: Yes  Fascia: No Tendon: No Muscle: No Joint: No Bone: No] Epithelialization: [17:None] [24:None] [35:None] Debridement: [17:Debridement - Excisional Debridement - Excisional N/A] Pre-procedure [17:08:16] [id number] [35:N/A] Verification/Time Out Taken: Tissue Debrided: [17:Subcutaneous, Slough] [24:Subcutaneous, Slough] [35:N/A] Level: [17:Skin/Subcutaneous Tissue Skin/Subcutaneous Tissue N/A] Debridement Area (sq cm):1 [24:10.5] [35:N/A] Instrument: [17:Curette] [24:Curette] [35:N/A] Bleeding: [17:Minimum] [24:Minimum] [35:N/A] Hemostasis Achieved: [17:Pressure] [24:Pressure] [35:N/A] Procedural Pain: [17:0] [24:0] [35:N/A] Post Procedural Pain: [17:0] [24:0] [35:N/A] Debridement Treatment Procedure was tolerated [24:Procedure was tolerated] [35:N/A] Response: [17:well] [24:well] Post Debridement [17:4.5x6x0.1] [24:3x3.5x0.2] [35:N/A] Measurements L x W x D (cm) Post Debridement [17:2.121] [24:1.649] [35:N/A] Volume: (cm) Procedures Performed: Debridement [24:Debridement] [35:N/A] Treatment Notes Electronic Signature(s) Signed: 10/01/2019 5:25:49 PM By: Linton Ham MD Signed: 10/01/2019 5:46:53 PM By: Levan Hurst RN, BSN Entered By: Linton Ham on 10/01/2019 08:23:54 -------------------------------------------------------------------------------- Multi-Disciplinary Care Plan Details Patient Name: Date of Service: Schrader, [name] 10/01/2019 7:30 AM Medical Record [id number] Patient Account Number: [id number] Date of Birth/Sex: Treating RN: 1986-07-26 (33 y.o. [name] Primary Care Broxton Broady: [name], [name] Other Clinician: Referring Jerold Yoss: Treating Ruthanna Macchia/Extender:Robson, [name], [name]: 195 Active Inactive Wound/Skin Impairment Nursing Diagnoses: Impaired tissue integrity Knowledge deficit related to ulceration/compromised skin  integrity Goals: Patient/caregiver will verbalize understanding of skin care regimen Date Initiated: 01/05/2016 Target Resolution Date: 10/19/2019 Goal Status: Active Ulcer/skin breakdown will have a volume reduction of 30% by week 4 Date Initiated: 01/05/2016 Date Inactivated: 12/22/2017 Target Resolution Date: 01/19/2018 Unmet Reason: complex Goal Status: Unmet wounds, infection Interventions: Assess patient/caregiver ability to obtain necessary supplies Assess ulceration(s) every visit Provide education on ulcer and skin care Notes: Electronic Signature(s) Signed: 10/01/2019 5:46:53 PM By: Levan Hurst RN, BSN Entered By: Levan Hurst on 10/01/2019 07:50:48 -------------------------------------------------------------------------------- Pain Assessment Details Patient Name: Date of Service: Chavous, [name] 10/01/2019 7:30 AM Medical Record [id number] Patient Account Number: [id number] Date of Birth/Sex: Treating RN: September 07, 1986 (33 y.o. [name] Primary Care Dominika Losey: [place], [place] Other Clinician: Referring Agostino Gorin: Treating Frazer [place]/Extender:Robson, [name], [name]: 195 Active Problems Location of Pain Severity and Description of Pain Patient Has Paino No Site Locations Pain Management and Medication Current Pain Management: Electronic Signature(s) Signed: 10/30/2019 2:53:59 PM By: Carlene Coria RN Entered By: Carlene Coria on 10/01/2019 07:53:38 -------------------------------------------------------------------------------- Patient/Caregiver Education Details Patient Name: Date of Service: [name] 11/9/2020andnbsp7:30 AM Medical Record [id number] Patient Account Number: [id number] Date of Birth/Gender: Feb 05, 1986 (33 y.o. M) Treating RN: Levan Hurst Primary Care Physician: Janine Limbo Other Clinician: Referring Physician: Treating Physician/Extender:Robson, Pecola Leisure in Treatment:  63 Education Assessment Education Provided To: Patient Education Topics Provided Wound/Skin Impairment: Methods: Explain/Verbal Responses: State content correctly Electronic Signature(s) Signed: 10/01/2019 5:46:53 PM By: Levan Hurst RN, BSN Entered By: Levan Hurst on 10/01/2019 07:51:05 --------------------------------------------------------------------------------  Wound Assessment Details Patient Name: Date of Service: [place]ENT[place], [name] 10/01/2019 7:30 AM Medical Record [id number] Patient Account Number: [id number] Date of Birth/Sex: Treating RN: 1986-08-06 (33 y.o. [name]) Carlene Coria Primary Care Norris Bodley: [place], [place] Other Clinician: Referring Jshon Ibe: Treating Nayeliz Hipp/Extender:Robson, [name], [name]: 195 Wound Status Wound Number: 17 Primary Etiology: Atypical Wound Location: Right Toe - Web between 1st and Wound Status: Open 2nd - Dorsal Comorbid Sleep Apnea, Hypertension, Wounding Event: Gradually Appeared History: Paraplegia Date Acquired: 04/07/2017 Weeks Of Treatment: 129 Clustered Wound: Yes Photos Wound Measurements Length: (cm) 4.5 % Reduct Width: (cm) 6 % Reduct Depth: (cm) 0.1 Epitheli Clustered Quantity: 2 Tunnelin Area: (cm) 21.206 Undermi Volume: (cm) 2.121 Wound Description Classification: Full Thickness Without Exposed Support Structures Classification: Structures Wound Distinct, outline attached Margin: Exudate Medium Amount: Exudate Serosanguineous Type: Exudate red, brown Color: Wound Bed Granulation Amount: Small (1-33%) Granulation Quality: Pale Necrotic Amount: Large (67-100%) Necrotic Quality: Adherent Slough Foul Odor After Cleansing: No Slough/Fibrino Yes Exposed Structure Fascia Exposed: No Fat Layer (Subcutaneous Tissue) Exposed: Yes Tendon Exposed: No Muscle Exposed: No Joint Exposed: No Bone Exposed: No ion in Area: -3500.3% ion in Volume: -3494.9% alization: None g:  No ning: No Electronic Signature(s) Signed: 10/02/2019 4:05:15 PM By: Mikeal Hawthorne EMT/HBOT Signed: 10/30/2019 2:53:59 PM By: Carlene Coria RN Entered By: Mikeal Hawthorne on 10/02/2019 09:06:25 -------------------------------------------------------------------------------- Wound Assessment Details Patient Name: Date of Service: Ramo, [name] 10/01/2019 7:30 AM Medical Record [id number] Patient Account Number: [id number] Date of Birth/Sex: Treating RN: 04/14/1986 (33 y.o. [name] Primary Care Juandedios Dudash: [place], [place] Other Clinician: Referring Barrington Worley: Treating Aeon Koors/Extender:Robson, [name], [name]: 195 Wound Status Wound Number: 24 Primary Etiology: Inflammatory Wound Location: Left Foot - Dorsal Wound Status: Open Wounding Event: Gradually Appeared Comorbid Sleep Apnea, Hypertension, History: Paraplegia Date Acquired: 03/08/2018 Weeks Of Treatment: 81 Clustered Wound: No Photos Wound Measurements Length: (cm) 3 % Reduct Width: (cm) 3.5 % Reduct Depth: (cm) 0.2 Epitheli Area: (cm) 8.247 Tunneli Volume: (cm) 1.649 Undermi Wound Description Classification: Full Thickness Without Exposed Support Foul Odo Structures Slough/F Wound Distinct, outline attached Margin: Exudate Medium Amount: Exudate Serosanguineous Type: Exudate red, brown Color: Wound Bed Granulation Amount: Small (1-33%) Granulation Quality: Pale Fascia E Necrotic Amount: Large (67-100%) Fat Laye Necrotic Quality: Adherent Slough Tendon E Muscle E Joint Ex Bone Exp r After Cleansing: No ibrino Yes Exposed Structure xposed: No r (Subcutaneous Tissue) Exposed: Yes xposed: No xposed: No posed: No osed: No ion in Area: -3394.5% ion in Volume: -6770.8% alization: None ng: No ning: No Electronic Signature(s) Signed: 10/02/2019 4:05:15 PM By: Mikeal Hawthorne EMT/HBOT Signed: 10/31/2019 12:08:43 PM By: Levan Hurst RN, BSN Previous Signature:  10/01/2019 5:46:53 PM Version By: Levan Hurst RN, BSN Entered By: Mikeal Hawthorne on 10/02/2019 08:51:34 -------------------------------------------------------------------------------- Wound Assessment Details Patient Name: Date of Service: Condon, [name] 10/01/2019 7:30 AM Medical Record [id number] Patient Account Number: [id number] Date of Birth/Sex: Treating RN: Apr 12, 1986 (33 y.o. [name] Primary Care Tirzah Fross: [place], [place] Other Clinician: Referring Affie Gasner: Treating Verdella Laidlaw/Extender:Robson, [name], [name]: 195 Wound Status Wound Number: 35 Primary Etiology: Pressure Ulcer Wound Location: Left Ischium Wound Status: Open Wounding Event: Gradually Appeared Comorbid Sleep Apnea, Hypertension, History: Paraplegia Date Acquired: 04/28/2019 Weeks Of Treatment: 22 Clustered Wound: Yes Photos Wound Measurements Length: (cm) 4.5 Width: (cm) 2 Depth: (cm) 0.1 Clustered Quantity: 2 Area: (cm) 7.069 Volume: (cm) 0.707 Wound Description Classification: Category/Stage III Wound Margin: Flat and Intact Exudate Amount: Medium Exudate  Type: Serosanguineous Exudate Color: red, brown Wound Bed Granulation Amount: Large (67-100%) Granulation Quality: Red, Friable Necrotic Amount: Small (1-33%) Necrotic Quality: Adherent Slough fter Cleansing: No ino Yes Exposed Structure ed: No ubcutaneous Tissue) Exposed: Yes ed: No ed: No d: No : No % Reduction in Area: 50% % Reduction in Volume: 50% Epithelialization: None Tunneling: No Undermining: No Foul Odor A Slough/Fibr Fascia Expos Fat Layer (S Tendon Expos Muscle Expos Joint Expose Bone Exposed Electronic Signature(s) Signed: 10/02/2019 4:05:15 PM By: Mikeal Hawthorne EMT/HBOT Signed: 10/30/2019 2:53:59 PM By: Carlene Coria RN Entered By: Mikeal Hawthorne on 10/02/2019 09:07:02 -------------------------------------------------------------------------------- Vitals Details Patient  Name: Date of Service: [place], [name] 10/01/2019 7:30 AM Medical Record [id number] Patient Account Number: [id number] Date of Birth/Sex: Treating RN: 1985/12/26 (33 y.o. [name] Primary Care Yareth Kearse: [place], [place] Other Clinician: Referring Sharise Lippy: Treating Amarian Botero/Extender:Robson, [name], [name]: 195 Vital Signs Time Taken: 07:53 Temperature (F): 98 Height (in): 70 Pulse (bpm): 126 Weight (lbs): 216 Respiratory Rate (breaths/min): 18 Body Mass Index (BMI): 31 Blood Pressure (mmHg): 125/65 Reference Range: 80 - 120 mg / dl Electronic Signature(s) Signed: 10/30/2019 2:53:59 PM By: Carlene Coria RN Entered By: Carlene Coria on 10/01/2019 07:53:30

## 2019-11-01 DIAGNOSIS — R635 Abnormal weight gain: Secondary | ICD-10-CM | POA: Diagnosis not present

## 2019-11-01 DIAGNOSIS — Z6829 Body mass index (BMI) 29.0-29.9, adult: Secondary | ICD-10-CM | POA: Diagnosis not present

## 2019-11-01 DIAGNOSIS — R350 Frequency of micturition: Secondary | ICD-10-CM | POA: Diagnosis not present

## 2019-11-01 DIAGNOSIS — I10 Essential (primary) hypertension: Secondary | ICD-10-CM | POA: Diagnosis not present

## 2019-11-05 ENCOUNTER — Other Ambulatory Visit: Payer: Self-pay

## 2019-11-05 ENCOUNTER — Encounter (HOSPITAL_BASED_OUTPATIENT_CLINIC_OR_DEPARTMENT_OTHER): Payer: BC Managed Care – PPO | Attending: Internal Medicine | Admitting: Internal Medicine

## 2019-11-05 DIAGNOSIS — L97521 Non-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: Secondary | ICD-10-CM | POA: Diagnosis not present

## 2019-11-05 DIAGNOSIS — L97522 Non-pressure chronic ulcer of other part of left foot with fat layer exposed: Secondary | ICD-10-CM | POA: Diagnosis not present

## 2019-11-05 DIAGNOSIS — G8220 Paraplegia, unspecified: Secondary | ICD-10-CM | POA: Diagnosis not present

## 2019-11-05 DIAGNOSIS — I10 Essential (primary) hypertension: Secondary | ICD-10-CM | POA: Diagnosis not present

## 2019-11-05 DIAGNOSIS — G4730 Sleep apnea, unspecified: Secondary | ICD-10-CM | POA: Diagnosis not present

## 2019-11-05 DIAGNOSIS — M359 Systemic involvement of connective tissue, unspecified: Secondary | ICD-10-CM | POA: Diagnosis not present

## 2019-11-05 DIAGNOSIS — L98419 Non-pressure chronic ulcer of buttock with unspecified severity: Secondary | ICD-10-CM | POA: Diagnosis not present

## 2019-11-05 DIAGNOSIS — L97511 Non-pressure chronic ulcer of other part of right 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: Secondary | ICD-10-CM | POA: Diagnosis not present

## 2019-11-05 NOTE — Progress Notes (Signed)
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 hydrogel to keep the area moist. This is been refractory to multiple dressing attempts. He also has areas in his first and second toes bilaterally in the anterior and posterior web space. I had been using Iodoflex here after a prolonged course of silver alginate with ketoconazole was ineffective [question tinea pedis] 07/14/17; patient arrives today with a very difficult adherent material over his left lateral lower leg wound. He also has surrounding erythema and poorly controlled edema. He was switched his Santyl last visit which the nurses are applying once during his doctor visit and once on a nurse visit. He was also reduced to 2 layer compression I'm not exactly sure of the issue here. 07/21/17; better surface today after 1 week of Iodoflex. Significant cellulitis that we treated last week also better. [Doxycycline] 07/28/17 better surface today with now 2 weeks of Iodoflex. Significant cellulitis treated with doxycycline. He has now completed the doxycycline and he is back to his usual degree of chronic venous  inflammation/stasis dermatitis. He reminds me he has had ablations surgery here 08/04/17; continued improvement with Iodoflex to the left lateral  leg wound in terms of the surface of the wound although the dimensions are better. 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 Problematic areas on the plantar aspect of the first second toe web space on the left and the dorsal aspect of the first second toe web space on the right. At one point I felt these were probably related to chronic fungal infections in treated him aggressively for this although we have not made any improvement here. 08/11/17; left lateral leg. Surface continues to improve with the Iodoflex although we are not seeing much improvement in overall wound dimensions. Areas on his plantar left foot and right foot show no improvement. In fact the right foot looks somewhat worse 08/18/17; left lateral leg. We changed to [facility] Blue last week after a prolonged course of Iodoflex which helps get the surface better. It appears that the wound with is improved. Continue with difficult areas on the left dorsal first second and plantar first second on the right 09/01/17; patient arrives in clinic today having had a temperature of 103 yesterday. He was seen in the ER and [facility]. The patient was concerned he could have cellulitis again in the right leg however they diagnosed him with a UTI and he is now on Keflex. He has a history of cellulitis which is been recurrent and difficult but this is been in the left leg, in the past 5 use doxycycline. He does in and out catheterizations at home which are risk factors for UTI 09/08/17; patient will be completing his Keflex this weekend. The erythema on the left leg is considerably better. He has a new wound today on the medial part of the right leg small superficial almost looks like a skin tear. He has worsening of the area on the right dorsal first and second toe. His major area on the left lateral leg is better. Using Hydrofera Blue on all areas 09/15/17; gradual  reduction in width on the long wound in the left lateral leg. No debridement required. He also has wounds on the plantar aspect of his left first second toe web space and on the dorsal aspect of the right first second toe web space. 09/22/17; there continues to be very gradual improvements in the dimensions of the left lateral leg wound. He hasn't round erythematous spot with might be pressure on his wheelchair. There is no evidence obviously of infection no purulence no warmth He has a dry scaled area on the plantar aspect of the left first second toe Improved area on the dorsal right first second toe. 09/29/17; left lateral leg wound continues to improve in dimensions mostly with an is still a fairly long but increasingly narrow wound. He has a dry scaled area on the plantar aspect of his left first second toe web space Increasingly concerning area on the dorsal right first second toe. In fact I am concerned today about possible cellulitis around this wound. The areas extending up his second toe and although there is deformities here almost appears to abut on the nailbed. 10/06/17; left lateral leg wound continues to make very gradual progress. Tissue culture I did from the right first second toe dorsal foot last time grew MRSA and enterococcus which was vancomycin sensitive. This was not sensitive to clindamycin or doxycycline. He is allergic to Zyvox and sulfa we  have therefore arrange for him to have dalvance infusion tomorrow. He is had this in the past and tolerated it well 10/20/17; left lateral leg wound continues to make decent progress. This is certainly reduced in terms of with there is advancing epithelialization.The cellulitis in the right foot looks better although he still has a deep wound in the dorsal aspect of the first second toe web space. Plantar left first toe web space on the left I think is making some progress 10/27/17; left lateral leg wound continues to make decent  progress. Advancing epithelialization.using Hydrofera Blue The right first second toe web space wound is better-looking using silver alginate Improvement in the left plantar first second toe web space. Again using silver alginate 11/03/17 left lateral leg wound continues to make decent progress albeit slowly. Using [facility] The right per second toe web space continues to be a very problematic looking punched out wound. I obtained a piece of tissue for deep culture I did extensively treated this for fungus. It is difficult to imagine that this is a pressure area as the patient states other than going outside he doesn't really wear shoes at home The left plantar first second toe web space looked fairly senescent. Necrotic edges. This required debridement change to [facility] Blue to all wound areas 11/10/17; left lateral leg wound continues to contract. Using Hydrofera Blue On the right dorsal first second toe web space dorsally. Culture I did of this area last week grew MRSA there is not an easy oral option in this patient was multiple antibiotic allergies or intolerances. This was only a rare culture isolate I'm therefore going to use Bactroban under silver alginate On the left plantar first second toe web space. Debridement is required here. This is also unchanged 11/17/17; left lateral leg wound continues to contract using Hydrofera Blue this is no longer the major issue. The major concern here is the right first second toe web space. He now has an open area going from dorsally to the plantar aspect. There is now wound on the inner lateral part of the first toe. Not a very viable surface on this. There is erythema spreading medially into the forefoot. No major change in the left first second toe plantar wound 11/24/17; left lateral leg wound continues to contract using Hydrofera Blue. Nice improvement today The right first second toe web space all of this looks a lot less angry than last  week. I have given him clindamycin and topical Bactroban for MRSA and terbinafine for the possibility of underlining tinea pedis that I could not control with ketoconazole. Looks somewhat better The area on the plantar left first second toe web space is weeping with dried debris around the wound 12/01/17; left lateral leg wound continues to contract he Hydrofera Blue. It is becoming thinner in terms of with nevertheless it is making good improvement. The right first second toe web space looks less angry but still a large necrotic-looking wounds starting on the plantar aspect of the right foot extending between the toes and now extensively on the base of the right second toe. I gave him clindamycin and topical Bactroban for MRSA anterior benefiting for the possibility of underlying tinea pedis. Not looking better today The area on the left first/second toe looks better. Debrided of necrotic debris 12/05/17* the patient was worked in urgently today because over the weekend he found blood on his incontinence bad when he woke up. He was found to have an ulcer by his wife who  does most of his wound care. He came in today for [place] to look at this. He has not had a history of wounds in his buttocks in spite of his paraplegia. 12/08/17; seen in follow-up today at his usual appointment. He was seen earlier this week and found to have a new wound on his buttock. We also follow him for wounds on the left lateral leg, left first second toe web space and right first second toe web space 12/15/17; we have been using Hydrofera Blue to the left lateral leg which has improved. The right first second toe web space has also improved. Left first second toe web space plantar aspect looks stable. The left buttock has worsened using Santyl. Apparently the buttock has drainage 12/22/17; we have been using Hydrofera Blue to the left lateral leg which continues to improve now 2 small wounds separated by normal skin. He tells  [place] he had a fever up to 100 yesterday he is prone to UTIs but has not noted anything different. He does in and out catheterizations. The area between the first and second toes today does not look good necrotic surface covered with what looks to be purulent drainage and erythema extending into the third toe. I had gotten this to something that I thought look better last time however it is not look good today. He also has a necrotic surface over the buttock wound which is expanded. I thought there might be infection under here so I removed a lot of the surface with a #5 curet though nothing look like it really needed culturing. He is been using Santyl to this area 12/27/17; his original wound on the left lateral leg continues to improve using Hydrofera Blue. I gave him samples of Baxdella although he was unable to take them out of fear for an allergic reaction ["lump in his throat"].the culture I did of the purulent drainage from his second toe last week showed both enterococcus and a set Enterobacter I was also concerned about the erythema on the bottom of his foot although paradoxically although this looks somewhat better today. 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 We've been using silver alginate here. They came up today that he sleeps in a recliner, I'm not sure why but I asked him to stop this 01/03/18; his original wound we've been using Hydrofera Blue is now separated into 2 areas. Ulcer on his left buttock is better he is off the recliner and sleeping in bed Finally both wound areas between his first and second toes also looks some better 01/10/18; his original wound on the left lateral leg is now separated into 2 wounds we've been using Hydrofera Blue Ulcer on his left buttock has some drainage. There is a small probing site going into muscle layer superiorly.using silver alginate -He arrives  today with a deep tissue injury on the left heel The wound on the dorsal aspect of his first second toe on the left looks a lot betterusing silver alginate ketoconazole The area on the first second toe web space on the right also looks a lot bette 01/17/18; his original wound on the left lateral leg continues to progress using Hydrofera Blue Ulcer on his left buttock also is smaller surface healthier except for a small probing site going into the muscle layer superiorly. 2.4 cm of tunneling in this area DTI on his left heel we have only been offloading.  Looks better than last week no threatened open no evidence of infection the wound on the dorsal aspect of the first second toe on the left continues to look like it's regressing we have only been using silver alginate and terbinafine orally The area in the first second toe web space on the right also looks to be a lot better using silver alginate and terbinafine I think this was prompted by tinea pedis 01/31/18; the patient was hospitalized in [place] last week apparently for a complicated UTI. He was discharged on cefepime he does in and out catheterizations. In the hospital he was discovered M I don't mild elevation of AST and ALTs and the terbinafine was stopped.predictably the pressure ulcer on his buttock looks betterusing silver alginate. The area on the left lateral leg also is better using Hydrofera Blue. The area between the first and second toes on the left better. First and second toes on the right still substantial but better. Finally the DTI on the left heel has held together and looks like it's resolving 02/07/18-he is here in follow-up evaluation for multiple ulcerations. He has new injury to the lateral aspect of the last issue a pressure ulcer, he states this is from adhesive removal trauma. He states he has tried multiple adhesive products with no success. All other ulcers appear stable. The left heel DTI is resolving. We will  continue with same [name] plan and follow-up next week. 02/14/18; follow-up for multiple areas. He has a new area last week on the lateral aspect of his pressure ulcer more over the posterior trochanter. The original pressure ulcer looks quite stable has healthy granulation. 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 Almost fully epithelialized on the original superior area using Hydrofera Blue DTI on the left heel has peeled off this week to reveal a small superficial wound under denuded skin and subcutaneous tissue Both areas between the first and second toes look better including nothing open on the left 02/21/18; The patient's wounds on his left ischial tuberosity and posterior left greater trochanter actually looked better. He has a large area of irritation around the area which I think is contact dermatitis. I am doubtful that this is fungal His original wound on the left lateral calf continues to improve we have been using Hydrofera Blue There is no open area in the left first second toe web space although there is a lot of thick callus The DTI on the left heel required debridement today of necrotic surface eschar and subcutaneous tissue using silver alginate Finally the area on the right first second toe webspace continues to contract using silver alginate and ketoconazole 02/28/18 Left ischial tuberosity wounds look better using silver alginate. Original wound on the left calf only has one small open area left using Hydrofera Blue DTI on the left heel required debridement mostly removing skin from around this wound surface. Using silver alginate The areas on the right first/second toe web space using silver alginate and ketoconazole 03/08/18 on evaluation today patient appears to be doing decently well as best I can tell in regard to his wounds. This is the first time that I have  seen him as he generally is followed by Dr. Dellia Nims. With that being said none of his wounds appear to be infected he does have an area where there is some skin covering what appears to be a new wound on the left dorsal  surface of his great toe. This is right at the nail bed. With that being said I do believe that debrided away some of the excess skin can be of benefit in this regard. Otherwise he has been tolerating the dressing changes without complication. 03/14/18; patient arrives today with the multiplicity of wounds that we are following. He has not been systemically unwell Original wound on the left lateral calf now only has 2 small open areas we've been using Hydrofera Blue which should continue The deep tissue injury on the left heel requires debridement today. We've been using silver alginate The left first second toe and the right first second toe are both are reminiscence what I think was tinea pedis. Apparently some of the callus Surface between the toes was removed last week when it started draining. Purulent drainage coming from the wound on the ischial tuberosity on the left. 03/21/18-He is here in follow-up evaluation for multiple wounds. 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 He tolerated debridement. The only change to last week's recommendations is to discontinue antifungal cream between toes. He will follow-up next week 03/28/18; following up for multiple wounds;Concern this week is streaking redness and swelling in the right foot. He is going to need antibiotics for this. 03/31/18; follow-up for right foot cellulitis. Streaking redness and swelling in the right foot on 03/28/18. 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 I put him on clindamycin 300 mg every 6 and brought him in for a quick check. He has an open wound between his first and second toes on the right foot as a potential  source. 04/04/18; Right foot cellulitis is resolving he is completing clindamycin. This is truly good news Left lateral calf wound which is initial wound only has one small open area inferiorly this is close to healing out. He has compression stockings. We will use Hydrofera Blue right down to the epithelialization of this Nonviable surface on the left heel which was initially pressure with a DTI. We've been using Hydrofera Blue. I'm going to switch this back to silver alginate Left first second toe/tinea pedis this looks better using silver alginate Right first second toe tinea pedis using silver alginate Large pressure ulcers on theLeft ischial tuberosity. Small wound here Looks better. I am uncertain about the surface over the large wound. Using silver alginate 04/11/18; Cellulitis in the right foot is resolved Left lateral calf wound which was his original wounds still has 2 tiny open areas remaining this is just about closed Nonviable surface on the left heel is better but still requires debridement Left first second toe/tinea pedis still open using silver alginate Right first second toe wound tinea pedis I asked him to go back to using ketoconazole and silver alginate Large pressure ulcers on the left ischial tuberosity this shear injury here is resolved. Wound is smaller. No evidence of infection using silver alginate 04/18/18; Patient arrives with an intense area of cellulitis in the right mid lower calf extending into the right heel area. Bright red and warm. Smaller area on the left anterior leg. He has a significant history of MRSA. He will definitely need antibioticsdoxycycline He now has 2 open areas on the left ischial tuberosity the original large wound and now a satellite area which I think was above his initial satellite areas. Not a wonderful surface on this satellite area surrounding erythema which looks like pressure related. His left lateral calf wound again his  original  wound is just about closed Left heel pressure injury still requiring debridement Left first second toe looks a lot better using silver alginate Right first second toe also using silver alginate and ketoconazole cream also looks better 04/20/18; the patient was worked in early today out of concerns with his cellulitis on the right leg. I had started him on doxycycline. This was 2 days ago. His wife was concerned about the swelling in the area. Also concerned about the left buttock. He has not been systemically unwell no fever chills. No nausea vomiting or diarrhea 04/25/18; the patient's left buttock wound is continued to deteriorate he is using Hydrofera Blue. He is still completing clindamycin for the cellulitis on the right leg although all of this looks better. 05/02/18 Left buttock wound still with a lot of drainage and a very tightly adherent fibrinous necrotic surface. He has a deeper area superiorly The left lateral calf wound is still closed DTI wound on the left heel necrotic surface especially the circumference using Iodoflex Areas between his left first second toe and right first second toe both look better. Dorsally and the right first second toe he had a necrotic surface although at smaller. In using silver alginate and ketoconazole. I did a culture last week which was a deep tissue culture of the reminiscence of the open wound on the right first second toe dorsally. This grew a few Acinetobacter and a few methicillin-resistant staph aureus. Nevertheless the area actually this week looked better. I didn't feel the need to specifically address this at least in terms of systemic antibiotics. 05/09/18; wounds are measuring larger more drainage per our intake. We are using Santyl covered with alginate on the large superficial buttock wounds, Iodosorb on the left heel, ketoconazole and silver alginate to the dorsal first and second toes bilaterally. 05/16/18; The area on his left buttock  better in some aspects although the area superiorly over the ischial tuberosity required an extensive debridement.using Santyl Left heel appears stable. Using Iodoflex The areas between his first and second toes are not bad however there is spreading erythema up the dorsal aspect of his left foot this looks like cellulitis again. He is insensate the erythema is really very brilliant.o Erysipelas He went to [name] an allergist days ago because he was itching part of this he had lab work done. This showed a white count of 15.1 with 70% neutrophils. Hemoglobin of 11.4 and a platelet count of 659,000. Last white count we had in Epic was a 2-1/2 years ago which was 25.9 but he was ill at the time. He was able to show me some lab work that was done by his primary physician the pattern is about the same. 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 But prompted me to go ahead and do x-rays of both feet and the pelvis rule out osteomyelitis. He also had a comprehensive metabolic panel this was reasonably normal his albumin was 3.7 liver function tests BUN/creatinine all normal 05/23/18; x-rays of both his feet from last week were negative for underlying pulmonary abnormality. The x-ray of his pelvis however showed mild irregularity in the left ischial which may represent some early osteomyelitis. The wound in the left ischial continues to get deeper clearly now exposed muscle. Each week necrotic surface material over this area. Whereas the rest of the wounds do not look so bad. The left ischial wound we have been using Santyl and calcium alginate  To the left heel surface necrotic debris using Iodoflex The left lateral leg is still healed Areas on the left dorsal foot and the right dorsal foot are about the same. There is some inflammation on the left which might represent contact dermatitis, fungal dermatitis I am doubtful cellulitis although this looks better  than last week 05/30/18; CT scan done at Hospital did not show any osteomyelitis or abscess. Suggested the possibility of underlying cellulitis although I don't [name] a lot of evidence of this at the bedside The wound itself on the left buttock/upper thigh actually looks somewhat better. No debridement Left heel also looks better no debridement continue Iodoflex Both dorsal first second toe spaces appear better using Lotrisone. Left still required debridement 06/06/18; Intake reported some purulent looking drainage from the left gluteal wound. Using Santyl and calcium alginate Left heel looks better although still a nonviable surface requiring debridement The left dorsal foot first/second webspace actually expanding and somewhat deeper. I may consider doing a shave biopsy of this area Right dorsal foot first/second webspace appears stable to improved. Using Lotrisone and silver alginate to both these areas 06/13/18 Left gluteal surface looks better. Now separated in the 2 wounds. No debridement required. Still drainage. We'll continue silver alginate Left heel continues to look better with Iodoflex continue this for at least another week Of his dorsal foot wounds the area on the left still has some depth although it looks better than last week. We've been using Lotrisone and silver alginate 06/20/18 Left gluteal continues to look better healthy tissue Left heel continues to look better healthy granulation wound is smaller. He is using Iodoflex and his long as this continues continue the Iodoflex Dorsal right foot looks better unfortunately dorsal left foot does not. There is swelling and erythema of his forefoot. He had minor trauma to this several days ago but doesn't think this was enough to have caused any tissue injury. Foot looks like cellulitis, we have had this problem before 06/27/18 on evaluation today patient appears to be doing a little worse in regard to his foot ulcer. 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 Both of which would really be his only options for treating this infection. In the past he has been given and effusion of Orbactiv. This is done very well for him in the past again it's one time dosing IV antibiotic therapy. Subsequently I do believe this is something we're gonna need to [name] about doing at this point in time. Currently his other wounds seem to be doing somewhat better in my pinion I'm pretty happy in that regard. 07/03/18 on evaluation today patient's wounds actually appear to be doing fairly well. He has been tolerating the dressing changes without complication. All in all he seems to be showing signs of improvement. In regard to the antibiotics he has been dealing with infectious disease since I saw him last week as far as getting this scheduled. In the end [facility]'s [facility] this coming Friday. In the meantime he has been continuing to perform the dressing changes in such as previous. There does not appear to be any evidence of infection worsengin at this time. 07/10/18; Since I last saw this man 2 weeks ago things have actually improved. IV antibiotics of resulted in less forefoot erythema  although there is still some present. He is not systemically unwell Left buttock wounds 2 now have no depth there is increased epithelialization Using silver alginate Left heel still requires debridement using Iodoflex Left dorsal foot still with a sizable wound about the size of a border but healthy granulation Right dorsal foot still with a slitlike area using silver alginate 07/18/18; the patient's cellulitis in the left foot is improved in fact I think it is on its way to resolving. Left buttock wounds 2 both look better although the larger one has hypertension  granulation we've been using silver alginate Left heel has some thick circumferential redundant skin over the wound edge which will need to be removed today we've been using Iodoflex Left dorsal foot is still a sizable wound required debridement using silver alginate The right dorsal foot is just about closed only a small open area remains here 07/25/18; left foot cellulitis is resolved Left buttock wounds 2 both look better. Hyper-granulation on the major area Left heel as some debris over the surface but otherwise looks a healthier wound. Using silver collagen Right dorsal foot is just about closed 07/31/18; arrives with our intake nurse worried about purulent drainage from the buttock. We had hyper-granulation here last week His buttock wounds 2 continue to look better Left heel some debris over the surface but measuring smaller. Right dorsal foot unfortunately has openings between the toes Left foot superficial wound looks less aggravated. 08/07/18 Buttock wounds continue to look better although some of her granulation and the larger medial wound. silver alginate Left heel continues to look a lot better.silver collagen Left foot superficial wound looks less stable. Requires debridement. He has a new wound superficial area on the foot on the lateral dorsal foot. Right foot looks better using silver alginate without Lotrisone 08/14/2018; patient was in the ER last week diagnosed with a UTI. He is now on Cefpodoxime and Macrodantin. Buttock wounds continued to be smaller. Using silver alginate Left heel continues to look better using silver collagen Left foot superficial wound looks as though it is improving Right dorsal foot area is just about healed. 08/21/2018; patient is completed his antibiotics for his UTI. He has 2 open areas on the buttocks. There is still not closed although the surface looks satisfactory. Using silver alginate Left heel continues to improve using silver  collagen The bilateral dorsal foot areas which are at the base of his first and second toes/possible tinea pedis are actually stable on the left but worse on the right. The area on the left required debridement of necrotic surface. After debridement I obtained a specimen for PCR culture. The right dorsal foot which is been just about healed last week is now reopened 08/28/2018; culture done on the left dorsal foot showed coag negative staph both staph epidermidis and Lugdunensis. I think this is worthwhile initiating systemic [name]. I will use doxycycline given his long list of allergies. The area on the left heel slightly improved but still requiring debridement. The large wound on the buttock is just about closed whereas the smaller one is larger. Using silver alginate in this area 09/04/2018; patient is completing his doxycycline for the left foot although this continues to be a very difficult wound area with very adherent necrotic debris. We are using silver alginate to all his wounds right foot left foot and the small wounds on his buttock, silver collagen on the left heel. 09/11/2018; once again this patient has intense erythema and swelling of the left forefoot.  Lesser degrees of erythema in the right foot. He has a long list of allergies and intolerances. I will reinstitute doxycycline. 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 Using silver alginate Left heel also looks better using silver collagen Unfortunately both the areas on his feet look worse. The area on the left first second webspace is now gone through to the plantar part of his foot. The area on the left foot anteriorly is irritated with erythema and swelling in the forefoot. 09/25/2018 His wound on the left plantar heel looks better. Using silver collagen The area on the left buttock 2 small remnant areas. One is closed one is still open. Using silver alginate The areas  between both his first and second toes look worse. This in spite of long-standing antifungal therapy with ketoconazole and silver alginate which should have antifungal activity He has small areas around his original wound on the left calf one is on the bottom of the original scar tissue and one superiorly both of these are small and superficial but again given wound history in this site this is worrisome 10/02/2018 Left plantar heel continues to gradually contract using silver collagen Left buttock wound is unchanged using silver alginate The areas on his dorsal feet between his first and second toes bilaterally look about the same. I prescribed clindamycin ointment to [name] if we can address chronic staph colonization and also the underlying possibility of erythrasma The left lateral lower extremity wound is actually on the lateral part of his ankle. Small open area here. We have been using silver alginate 10/09/2018; Left plantar heel continues to look healthy and contract. No debridement is required Left buttock slightly smaller with a tape injury wound just below which was new this week Dorsal feet somewhat improved I have been using clindamycin Left lateral looks lower extremity the actual open area looks worse although a lot of this is epithelialized. I am going to change to silver collagen today He has a lot more swelling in the right leg although this is not pitting not red and not particularly warm there is a lot of spasm in the right leg usually indicative of people with paralysis of some underlying discomfort. We have reviewed his vascular status from 2017 he had a left greater saphenous vein ablation. I wonder about referring him back to vascular surgery if the area on the left leg continues to deteriorate. 10/16/2018 in today for follow-up and management of multiple lower extremity ulcers. His left Buttock wound is much lower smaller and almost closed completely. The wound to the  left ankle has began to reopen with Epithelialization and some adherent slough. He has multiple new areas to the left foot and leg. The left dorsal foot without much improvement. Wound present between left great webspace and 2nd toe. Erythema and edema present right leg. Right LE ultrasound obtained on 10/10/18 was negative for DVT. 10/23/2018; Left buttock is closed over. Still dry macerated skin but there is no open wound. I suspect this is chronic pressure/moisture Left lateral calf is quite a bit worse than when I saw this last. There is clearly drainage here he has macerated skin into the left plantar heel. We will change the primary dressing to alginate Left dorsal foot has some improvement in overall wound area. Still using clindamycin and silver alginate Right dorsal foot about the same as the left using clindamycin and silver alginate The erythema in the right leg  has resolved. He is DVT rule out was negative Left heel pressure area required debridement although the wound is smaller and the surface is health 10/26/2018 The patient came back in for his nurse check today predominantly because of the drainage coming out of the left lateral leg with a recent reopening of his original wound on the left lateral calf. He comes in today with a large amount of surrounding erythema around the wound extending from the calf into the ankle and even in the area on the dorsal foot. He is not systemically unwell. He is not febrile. Nevertheless this looks like cellulitis. We have been using silver alginate to the area. I changed him to a regular visit and I am going to prescribe him doxycycline. The rationale here is a long list of medication intolerances and a history of MRSA. I did not [name] anything that I thought would provide a valuable culture 10/30/2018 Follow-up from his appointment 4 days ago with really an extensive area of cellulitis in the left calf left lateral ankle and left dorsal foot. I  put him on doxycycline. He has a long list of medication allergies which are true allergy reactions. 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 In any case he is a lot better today. The erythema especially in the anterior and lateral left calf is better. He still has left ankle erythema. He also is complaining about increasing edema in the right leg we have only been using Kerlix Coban and he has been doing the wraps at home. Finally he has a spotty rash on the medial part of his upper left calf which looks like folliculitis or perhaps wrap occlusion type injury. Small superficial macules not pustules 11/06/18 patient arrives today with again a considerable degree of erythema around the wound on the left lateral calf extending into the dorsal ankle and dorsal foot. This is a lot worse than when I saw this last week. He is on doxycycline really with not a lot of improvement. He has not been systemically unwell Wounds on the; left heel actually looks improved. Original area on the left foot and proximity to the first and second toes looks about the same. He has superficial areas on the dorsal foot, anterior calf and then the reopening of his original wound on the left lateral calf which looks about the same The only area he has on the right is the dorsal webspace first and second which is smaller. He has a large area of dry erythematous skin on the left buttock small open area here. 11/13/2018; the patient arrives in much better condition. The erythema around the wound on the left lateral calf is a lot better. Not sure whether this was the clindamycin or the TCA and ketoconazole or just in the improvement in edema control [stasis dermatitis]. In any case this is a lot better. The area on the left heel is very small and just about resolved using silver collagen we have been using silver alginate to the areas on his dorsal  feet 11/20/2018; his wounds include the left lateral calf, left heel, dorsal aspects of both feet just proximal to the first second webspace. He is stable to slightly improved. I did not think any changes to his dressings were going to be necessary 11/27/2018 he has a reopening on the left buttock which is surrounded by what looks like tinea or perhaps some other form of dermatitis. The area on  the left dorsal foot has some erythema around it I have marked this area but I am not sure whether this is cellulitis or not. Left heel is not closed. Left calf the reopening is really slightly longer and probably worse 1/13; in general things look better and smaller except for the left dorsal foot. Area on the left heel is just about closed, left buttock looks better only a small wound remains in the skin looks better [using Lotrisone] 1/20; the area on the left heel only has a few remaining open areas here. Left lateral calf about the same in terms of size, left dorsal foot slightly larger right lateral foot still not closed. The area on the left buttock has no open wound and the surrounding skin looks a lot better 1/27; the area on the left heel is closed. Left lateral calf better but still requiring extensive debridements. The area on his left buttock is closed. He still has the open areas on the left dorsal foot which is slightly smaller in the right foot which is slightly expanded. We have been using Iodoflex on these areas as well 2/3; left heel is closed. Left lateral calf still requiring debridement using Iodoflex there is no open area on his left buttock however he has dry scaly skin over a large area of this. Not really responding well to the Lotrisone. Finally the areas on his dorsal feet at the level of the first second webspace are slightly smaller on the right and about the same on the left. Both of these vigorously debrided with Anasept and gauze 2/10; left heel remains closed he has dry  erythematous skin over the left buttock but there is no open wound here. Left lateral leg has come in and with. Still requiring debridement we have been using Iodoflex here. Finally the area on the left dorsal foot and right dorsal foot are really about the same extremely dry callused fissured areas. He does not yet have a dermatology appointment 2/17; left heel remains closed. He has a new open area on the left buttock. The area on the left lateral calf is bigger longer and still covered in necrotic debris. No major change in his foot areas bilaterally. I am awaiting for a dermatologist to look on this. We have been using ketoconazole I do not know that this is been doing any good at all. 2/24; left heel remains closed. The left buttock wound that was new reopening last week looks better. The left lateral calf appears better also although still requires debridement. The major area on his foot is the left first second also requiring debridement. We have been putting Prisma on all wounds. I do not believe that the ketoconazole has done too much good for his feet. He will use Lotrisone I am going to give him a 2-week course of terbinafine. We still do not have a dermatology appointment 3/2 left heel remains closed however there is skin over bone in this area I pointed this out to him today. The left buttock wound is epithelialized but still does not look completely stable. The area on the left leg required debridement were using silver collagen here. With regards to his feet we changed to Lotrisone last week and silver alginate. 3/9; left heel remains closed. Left buttock remains closed. The area on the right foot is essentially closed. The left foot remains unchanged. Slightly smaller on the left lateral calf. Using silver collagen to both of these areas 3/16-Left heel remains closed. Area on  right foot is closed. Left lateral calf above the lateral malleolus open wound requiring debridement with  easy bleeding. Left dorsal wound proximal to first toe also debrided. Left ischial area open new. Patient has been using Prisma with wrapping every 3 days. Dermatology appointment is apparently tomorrow.Patient has completed his terbinafine 2-week course with some apparent improvement according to him, there is still flaking and dry skin in his foot on the left 3/23; area on the right foot is reopened. The area on the left anterior foot is about the same still a very necrotic adherent surface. He still has the area on the left leg and reopening is on the left buttock. He apparently saw dermatology although I do not have a note. According to the patient who is usually fairly well informed they did not have any good ideas. Put him on oral terbinafine which she is been on before. 3/30; using silver collagen to all wounds. Apparently his dermatologist put him on doxycycline and rifampin presumably some culture grew staph. I do not have this result. He remains on terbinafine although I have used terbinafine on him before 4/6; patient has had a fairly substantial reopening on the right foot between the first and second toes. He is finished his terbinafine and I believe is on doxycycline and rifampin still as prescribed by dermatology. We have been using silver collagen to all his wounds although the patient reports that he thinks silver alginate does better on the wounds on his buttock. 4/13; the area on his left lateral calf about the same size but it did not require debridement. Left dorsal foot just proximal to the webspace between the first and second toes is about the same. Still nonviable surface. I note some superficial bronze discoloration of the dorsal part of his foot Right dorsal foot just proximal to the first and second toes also looks about the same. I still think there may be the same discoloration I noted above on the left Left buttock wound looks about the same 4/20; left lateral calf  appears to be gradually contracting using silver collagen. He remains on erythromycin empiric [name] for possible erythrasma involving his digital spaces. The left dorsal foot wound is debrided of tightly adherent necrotic debris and really cleans up quite nicely. The right area is worse with expansion. I did not debride this it is now over the base of the second toe The area on his left buttock is smaller no debridement is required using silver collagen 5/4; left calf continues to make good progress. He arrives with erythema around the wounds on his dorsal foot which even extends to the plantar aspect. Very concerning for coexistent infection. He is finished the erythromycin I gave him for possible erythrasma this does not seem to have helped. The area on the left foot is about the same base of the dorsal toes Is area on the buttock looks improved on the left 5/11; left calf and left buttock continued to make good progress. Left foot is about the same to slightly improved. Major problem is on the right foot. He has not had an x-ray. Deep tissue culture I did last week showed both Enterobacter and E. coli. I did not change the doxycycline I put him on empirically although neither 1 of these were plated to doxycycline. He arrives today with the erythema looking worse on both the dorsal and plantar foot. Macerated skin on the bottom of the foot. he has not been systemically unwell 5/18-Patient returns at 1  week, left calf wound appears to be making some progress, left buttock wound appears slightly worse than last time, left foot wound looks slightly better, right foot redness is marginally better. X-ray of both feet show no air or evidence of osteomyelitis. Patient is finished his Omnicef and terbinafine. He continues to have macerated skin on the bottom of the left foot as well as right 5/26; left calf wound is better, left buttock wound appears to have multiple small superficial open areas  with surrounding macerated skin. X-rays that I did last time showed no evidence of osteomyelitis in either foot. He is finished cefdinir and doxycycline. I do not think that he was on terbinafine. He continues to have a large superficial open area on the right foot anterior dorsal and slightly between the first and second toes. I did send him to dermatology 2 months ago or so wondering about whether they would do a fungal scraping. I do not believe they did but did do a culture. We have been using silver alginate to the toe areas, he has been using antifungals at home topically either ketoconazole or Lotrisone. We are using silver collagen on the left foot, silver alginate on the right, silver collagen on the left lateral leg and silver alginate on the left buttock 6/1; left buttock area is healed. We have the left dorsal foot, left lateral leg and right dorsal foot. We are using silver alginate to the areas on both feet and silver collagen to the area on his left lateral calf 6/8; the left buttock apparently reopened late last week. He is not really sure how this happened. He is tolerating the terbinafine. Using silver alginate to all wounds 6/15; left buttock wound is larger than last week but still superficial. Came in the clinic today with a report of purulence from the left lateral leg I did not identify any infection Both areas on his dorsal feet appear to be better. He is tolerating the terbinafine. Using silver alginate to all wounds 6/22; left buttock is about the same this week, left calf quite a bit better. His left foot is about the same however he comes in with erythema and warmth in the right forefoot once again. Culture that I gave him in the beginning of May showed Enterobacter and E. coli. I gave him doxycycline and things seem to improve although neither 1 of these organisms was specifically plated. 6/29; left buttock is larger and dry this week. Left lateral calf looks to me to  be improved. Left dorsal foot also somewhat improved right foot completely unchanged. The erythema on the right foot is still present. He is completing the Ceftin dinner that I gave him empirically [[name] discussion above.) 7/6 - All wounds look to be stable and perhaps improved, the left buttock wound is slightly smaller, per patient bleeds easily, completed ceftin, the right foot redness is less, he is on terbinafine 7/13; left buttock wound about the same perhaps slightly narrower. Area on the left lateral leg continues to narrow. Left dorsal foot slightly smaller right foot about the same. We are using silver alginate on the right foot and Hydrofera Blue to the areas on the left. Unna boot on the left 2 layer compression on the right 7/20; left buttock wound absolutely the same. Area on lateral leg continues to get better. Left dorsal foot require debridement as did the right no major change in the 7/27; left buttock wound the same size necrotic debris over the surface. The area  on the lateral leg is closed once again. His left foot looks better right foot about the same although there is some involvement now of the posterior first second toe area. He is still on terbinafine which I have given him for a month, not certain a centimeter major change 06/25/19-All wounds appear to be slightly improved according to report, left buttock wound looks clean, both foot wounds have minimal to no debris the right dorsal foot has minimal slough. We are using Hydrofera Blue to the left and silver alginate to the right foot and ischial wound. 8/10-Wounds all appear to be around the same, the right forefoot distal part has some redness which was not there before, however the wound looks clean and small. Ischial wound looks about the same with no changes 8/17; his wound on the left lateral calf which was his original chronic venous insufficiency wound remains closed. Since I last saw him the areas on the left  dorsal foot right dorsal foot generally appear better but require debridement. The area on his left initial tuberosity appears somewhat larger to me perhaps hyper granulated and bleeds very easily. We have been using Hydrofera Blue to the left dorsal foot and silver alginate to everything else 8/24; left lateral calf remains closed. The areas on his dorsal feet on the webspace of the first and second toes bilaterally both look better. The area on the left buttock which is the pressure ulcer stage II slightly smaller. I change the dressing to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 top of some degree of lymphedema. His left anterior leg wound has less surface slough as reported by her intake nurse. Small wound at the base of the left second toe 12/02/16; patient's right leg is better and there is no open wound here. His left anterior lateral leg wound continues to have a healthy-looking surface. Small wound at the base of the left second toe however there is erythema in the left forefoot which is worrisome 12/16/16; is no open wounds on his right leg. We took  measurements for stockings. His left anterior lateral leg wound continues to have a healthy-looking surface. I'm not sure where we were with the Apligraf run through his insurance. We have been using Iodoflex. He has a thick eschar on the left first second toe interface, I suspect this may be fungal however there is no visible open 12/23/16; no open wound on his right leg. He has 2 small areas left of the linear wound that was remaining last week. We have been using Prisma, I thought I have disclosed this week, we can only look forward to next week 01/03/17; the patient had concerning areas of erythema last week, already on doxycycline for UTI through his primary doctor. The erythema is absolutely no better there is warmth and swelling both medially from the left lateral leg wound and also the dorsal left foot. 01/06/17- Patient is here for follow-up evaluation of his left lateral leg ulcer and bilateral feet ulcers. He is on oral antibiotic therapy, tolerating that. Nursing staff and the patient states that the erythema is improved from Monday. 01/13/17; the predominant left lateral leg wound continues to be problematic. I had put Apligraf on him earlier this month once. However he subsequently developed what appeared to be an intense cellulitis around the left lateral leg wound. I gave him Dalvance I think on 2/12 perhaps 2/13 he continues on cefdinir. The erythema is still present but the warmth and swelling is improved. I am hopeful that the cellulitis part of this control. I wouldn't be surprised if there is an element of venous inflammation as well. 01/17/17. The erythema is present but better in the left leg. His left lateral leg wound still does not have a viable surface buttons certain parts of this long thin wound it appears like there has been improvement in dimensions. 01/20/17; the erythema still present but much better in the left leg. I'm thinking this is his usual degree of chronic venous  inflammation. The wound on the left leg looks somewhat better. Is less surface slough 01/27/17; erythema is back to the chronic venous inflammation. The wound on the left leg is somewhat better. I am back to the point where I like to try an Apligraf once again 02/10/17; slight improvement in wound dimensions. Apligraf #2. He is completing his doxycycline 02/14/17; patient arrives today having completed doxycycline last Thursday. This was supposed to be a nurse visit however once again he hasn't tense erythema from the medial part of his wound extending over the lower leg. Also erythema in his foot this is roughly in the same distribution as last time. He has baseline chronic venous inflammation however this is a lot worse than the baseline I have learned to accept the on him is baseline inflammation 02/24/17- patient is here for follow-up evaluation. He is tolerating compression therapy. His voicing no complaints or concerns he is here anticipating an Apligraf 03/03/17; he arrives today with an adherent necrotic surface. I don't think this  is surface is going to be amenable for Apligraf's. The erythema around his wound and on the left dorsal foot has resolved he is off antibiotics 03/10/17; better-looking surface today. I don't think he can tolerate Apligraf's. He tells me he had a wound VAC after a skin graft years ago to this area and they had difficulty with a seal. The erythema continues to be stable around this some degree of chronic venous inflammation but he also has recurrent cellulitis. We have been using Iodoflex 03/17/17; continued improvement in the surface and may be small changes in dimensions. Using Iodoflex which seems the only thing that will control his surface 03/24/17- He is here for follow up evaluation of his LLE lateral ulceration and ulcer to right dorsal foot/toe space. He is voicing no complaints or concerns, He is tolerating compression wrap. 03/31/17 arrives today with a much  healthier looking wound on the left lower extremity. We have been using Iodoflex for a prolonged period of time which has for the first time prepared and adequate looking wound bed although we have not had much in the way of wound dimension improvement. He also has a small wound between the first and second toe on the right 04/07/17; arrives today with a healthy-looking wound bed and at least the top 50% of this wound appears to be now her. No debridement was required I have changed him to [facility] last week after prolonged Iodoflex. He did not do well with Apligraf's. We've had a re-opening between the first and second toe on the right 04/14/17; arrives today with a healthier looking wound bed contractions and the top 50% of this wound and some on the lesser 50%. Wound bed appears healthy. The area between the first and second toe on the right still remains problematic 04/21/17; continued very gradual improvement. Using [facility] 04/28/17; continued very gradual improvement in the left lateral leg venous insufficiency wound. His periwound erythema is very mild. We have been using Hydrofera Blue. Wound is making progress especially in the superior 50% 05/05/17; he continues to have very gradual improvement in the left lateral venous insufficiency wound. Both in terms with an length rings are improving. I debrided this every 2 weeks with #5 curet and we have been using Hydrofera Blue and again making good progress With regards to the wounds between his right first and second toe which I thought might of been tinea pedis he is not making as much progress very dry scaly skin over the area. Also the area at the base of the left first and second toe in a similar condition 05/12/17; continued gradual improvement in the refractory left lateral venous insufficiency wound on the left. Dimension smaller. Surface still requiring debridement using Hydrofera Blue 05/19/17; continued gradual improvement  in the refractory left lateral venous ulceration. Careful inspection of the wound bed underlying rumination suggested some degree of epithelialization over the surface no debridement indicated. Continue Hydrofera Blue difficult areas between his toes first and third on the left than first and second on the right. I'm going to change to silver alginate from silver collagen. Continue ketoconazole as I suspect underlying tinea pedis 05/26/17; left lateral leg venous insufficiency wound. We've been using Hydrofera Blue. I believe that there is expanding epithelialization over the surface of the wound albeit not coming from the wound circumference. This is a bit of an odd situation in which the epithelialization seems to be coming from the surface of the wound rather than in the exact circumference.  There is still small open areas mostly along the lateral margin of the wound. ooHe has unchanged areas between the left first and second and the right first second toes which I been treating for tenia pedis 06/02/17; left lateral leg venous insufficiency wound. We have been using Hydrofera Blue. Somewhat smaller from the wound circumference. The surface of the wound remains a bit on it almost epithelialized sedation in appearance. I use an open curette today debridement in the surface of all of this especially the edges ooSmall open wounds remaining on the dorsal right first and second toe interspace and the plantar left first second toe and her face on the left 06/09/17; wound on the left lateral leg continues to be smaller but very gradual and very dry surface using Hydrofera Blue 06/16/17 requires weekly debridements now on the left lateral leg although this continues to contract. I changed to silver collagen last week because of dryness of the wound bed. Using Iodoflex to the areas on his first and second toes/web space bilaterally 06/24/17; patient with history of paraplegia also chronic venous  insufficiency with lymphedema. Has a very difficult wound on the left lateral leg. This has been gradually reducing in terms of with but comes in with a very dry adherent surface. High switch to silver collagen a week or so ago with hydrogel to keep the area moist. This is been refractory to multiple dressing attempts. He also has areas in his first and second toes bilaterally in the anterior and posterior web space. I had been using Iodoflex here after a prolonged course of silver alginate with ketoconazole was ineffective [question tinea pedis] 07/14/17; patient arrives today with a very difficult adherent material over his left lateral lower leg wound. He also has surrounding erythema and poorly controlled edema. He was switched his Santyl last visit which the nurses are applying once during his doctor visit and once on a nurse visit. He was also reduced to 2 layer compression I'm not exactly sure of the issue here. 07/21/17; better surface today after 1 week of Iodoflex. Significant cellulitis that we treated last week also better. [Doxycycline] 07/28/17 better surface today with now 2 weeks of Iodoflex. Significant cellulitis treated with doxycycline. He has now completed the doxycycline and he is back to his usual degree of chronic venous inflammation/stasis dermatitis. He reminds me he has had ablations surgery here 08/04/17; continued improvement with Iodoflex to the left lateral leg wound in terms of the surface of the wound although the dimensions are better. 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 Problematic areas on the plantar aspect of the first second toe web space on the left and the dorsal aspect of the first second toe web space on the right. At one point I felt these were probably related to chronic fungal infections in treated him aggressively for this although we have not made any improvement  here. 08/11/17; left lateral leg. Surface continues to improve with the Iodoflex although we are not seeing much improvement in overall wound dimensions. Areas on his plantar left foot and right foot show no improvement. In fact the right foot looks somewhat worse 08/18/17; left lateral leg. We changed to [facility] Blue last week after a prolonged course of Iodoflex which helps get the surface better. It appears that the wound with is improved. Continue with difficult areas on the left dorsal first second and plantar first second on the right 09/01/17; patient arrives  in clinic today having had a temperature of 103 yesterday. He was seen in the ER and [facility]. The patient was concerned he could have cellulitis again in the right leg however they diagnosed him with a UTI and he is now on Keflex. He has a history of cellulitis which is been recurrent and difficult but this is been in the left leg, in the past 5 use doxycycline. He does in and out catheterizations at home which are risk factors for UTI 09/08/17; patient will be completing his Keflex this weekend. The erythema on the left leg is considerably better. He has a new wound today on the medial part of the right leg small superficial almost looks like a skin tear. He has worsening of the area on the right dorsal first and second toe. His major area on the left lateral leg is better. Using Hydrofera Blue on all areas 09/15/17; gradual reduction in width on the long wound in the left lateral leg. No debridement required. He also has wounds on the plantar aspect of his left first second toe web space and on the dorsal aspect of the right first second toe web space. 09/22/17; there continues to be very gradual improvements in the dimensions of the left lateral leg wound. He hasn't round erythematous spot with might be pressure on his wheelchair. There is no evidence obviously of infection no purulence no warmth ooHe has a dry scaled area on  the plantar aspect of the left first second toe ooImproved area on the dorsal right first second toe. 09/29/17; left lateral leg wound continues to improve in dimensions mostly with an is still a fairly long but increasingly narrow wound. ooHe has a dry scaled area on the plantar aspect of his left first second toe web space ooIncreasingly concerning area on the dorsal right first second toe. In fact I am concerned today about possible cellulitis around this wound. The areas extending up his second toe and although there is deformities here almost appears to abut on the nailbed. 10/06/17; left lateral leg wound continues to make very gradual progress. Tissue culture I did from the right first second toe dorsal foot last time grew MRSA and enterococcus which was vancomycin sensitive. This was not sensitive to clindamycin or doxycycline. He is allergic to Zyvox and sulfa we have therefore arrange for him to have dalvance infusion tomorrow. He is had this in the past and tolerated it well 10/20/17; left lateral leg wound continues to make decent progress. This is certainly reduced in terms of with there is advancing epithelialization.ooThe cellulitis in the right foot looks better although he still has a deep wound in the dorsal aspect of the first second toe web space. Plantar left first toe web space on the left I think is making some progress 10/27/17; left lateral leg wound continues to make decent progress. Advancing epithelialization.using Hydrofera Blue ooThe right first second toe web space wound is better-looking using silver alginate ooImprovement in the left plantar first second toe web space. Again using silver alginate 11/03/17 left lateral leg wound continues to make decent progress albeit slowly. Using Hydrofera Blue ooThe right per second toe web space continues to be a very problematic looking punched out wound. I obtained a piece of tissue for deep culture I did extensively  treated this for fungus. It is difficult to imagine that this is a pressure area as the patient states other than going outside he doesn't really wear shoes at home ooThe left plantar first  second toe web space looked fairly senescent. Necrotic edges. This required debridement oochange to Hydrofera Blue to all wound areas 11/10/17; left lateral leg wound continues to contract. Using Hydrofera Blue ooOn the right dorsal first second toe web space dorsally. Culture I did of this area last week grew MRSA there is not an easy oral option in this patient was multiple antibiotic allergies or intolerances. This was only a rare culture isolate I'm therefore going to use Bactroban under silver alginate ooOn the left plantar first second toe web space. Debridement is required here. This is also unchanged 11/17/17; left lateral leg wound continues to contract using Hydrofera Blue this is no longer the major issue. ooThe major concern here is the right first second toe web space. He now has an open area going from dorsally to the plantar aspect. There is now wound on the inner lateral part of the first toe. Not a very viable surface on this. There is erythema spreading medially into the forefoot. ooNo major change in the left first second toe plantar wound 11/24/17; left lateral leg wound continues to contract using Hydrofera Blue. Nice improvement today ooThe right first second toe web space all of this looks a lot less angry than last week. I have given him clindamycin and topical Bactroban for MRSA and terbinafine for the possibility of underlining tinea pedis that I could not control with ketoconazole. Looks somewhat better ooThe area on the plantar left first second toe web space is weeping with dried debris around the wound 12/01/17; left lateral leg wound continues to contract he Hydrofera Blue. It is becoming thinner in terms of with nevertheless it is making good improvement. ooThe right  first second toe web space looks less angry but still a large necrotic-looking wounds starting on the plantar aspect of the right foot extending between the toes and now extensively on the base of the right second toe. I gave him clindamycin and topical Bactroban for MRSA anterior benefiting for the possibility of underlying tinea pedis. Not looking better today ooThe area on the left first/second toe looks better. Debrided of necrotic debris 12/05/17* the patient was worked in urgently today because over the weekend he found blood on his incontinence bad when he woke up. He was found to have an ulcer by his wife who does most of his wound care. He came in today for [place] to look at this. He has not had a history of wounds in his buttocks in spite of his paraplegia. 12/08/17; seen in follow-up today at his usual appointment. He was seen earlier this week and found to have a new wound on his buttock. We also follow him for wounds on the left lateral leg, left first second toe web space and right first second toe web space 12/15/17; we have been using Hydrofera Blue to the left lateral leg which has improved. The right first second toe web space has also improved. Left first second toe web space plantar aspect looks stable. The left buttock has worsened using Santyl. Apparently the buttock has drainage 12/22/17; we have been using Hydrofera Blue to the left lateral leg which continues to improve now 2 small wounds separated by normal skin. He tells [place] he had a fever up to 100 yesterday he is prone to UTIs but has not noted anything different. He does in and out catheterizations. The area between the first and second toes today does not look good necrotic surface covered with what looks to be purulent drainage  and erythema extending into the third toe. I had gotten this to something that I thought look better last time however it is not look good today. He also has a necrotic surface over the buttock  wound which is expanded. I thought there might be infection under here so I removed a lot of the surface with a #5 curet though nothing look like it really needed culturing. He is been using Santyl to this area 12/27/17; his original wound on the left lateral leg continues to improve using Hydrofera Blue. I gave him samples of Baxdella although he was unable to take them out of fear for an allergic reaction ["lump in his throat"].the culture I did of the purulent drainage from his second toe last week showed both enterococcus and a set Enterobacter I was also concerned about the erythema on the bottom of his foot although paradoxically although this looks somewhat better today. 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 We've been using silver alginate here. They came up today that he sleeps in a recliner, I'm not sure why but I asked him to stop this 01/03/18; his original wound we've been using Hydrofera Blue is now separated into 2 areas. ooUlcer on his left buttock is better he is off the recliner and sleeping in bed ooFinally both wound areas between his first and second toes also looks some better 01/10/18; his original wound on the left lateral leg is now separated into 2 wounds we've been using Hydrofera Blue ooUlcer on his left buttock has some drainage. There is a small probing site going into muscle layer superiorly.using silver alginate -He arrives today with a deep tissue injury on the left heel ooThe wound on the dorsal aspect of his first second toe on the left looks a lot betterusing silver alginate ketoconazole ooThe area on the first second toe web space on the right also looks a lot bette 01/17/18; his original wound on the left lateral leg continues to progress using Hydrofera Blue ooUlcer on his left buttock also is smaller surface healthier except for a small probing site going  into the muscle layer superiorly. 2.4 cm of tunneling in this area ooDTI on his left heel we have only been offloading. Looks better than last week no threatened open no evidence of infection oothe wound on the dorsal aspect of the first second toe on the left continues to look like it's regressing we have only been using silver alginate and terbinafine orally ooThe area in the first second toe web space on the right also looks to be a lot better using silver alginate and terbinafine I think this was prompted by tinea pedis 01/31/18; the patient was hospitalized in [place] last week apparently for a complicated UTI. He was discharged on cefepime he does in and out catheterizations. In the hospital he was discovered M I don't mild elevation of AST and ALTs and the terbinafine was stopped.predictably the pressure ulcer on his buttock looks betterusing silver alginate. The area on the left lateral leg also is better using Hydrofera Blue. The area between the first and second toes on the left better. First and second toes on the right still substantial but better. Finally the DTI on the left heel has held together and looks like it's resolving 02/07/18-he is here in follow-up evaluation for multiple ulcerations. He has new injury to the lateral aspect of the last issue a pressure  ulcer, he states this is from adhesive removal trauma. He states he has tried multiple adhesive products with no success. All other ulcers appear stable. The left heel DTI is resolving. We will continue with same [name] plan and follow-up next week. 02/14/18; follow-up for multiple areas. ooHe has a new area last week on the lateral aspect of his pressure ulcer more over the posterior trochanter. The original pressure ulcer looks quite stable has healthy granulation. 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  Almost fully epithelialized on the original superior area using Hydrofera Blue ooDTI on the left heel has peeled off this week to reveal a small superficial wound under denuded skin and subcutaneous tissue ooBoth areas between the first and second toes look better including nothing open on the left 02/21/18; ooThe patient's wounds on his left ischial tuberosity and posterior left greater trochanter actually looked better. He has a large area of irritation around the area which I think is contact dermatitis. I am doubtful that this is fungal ooHis original wound on the left lateral calf continues to improve we have been using Hydrofera Blue ooThere is no open area in the left first second toe web space although there is a lot of thick callus ooThe DTI on the left heel required debridement today of necrotic surface eschar and subcutaneous tissue using silver alginate ooFinally the area on the right first second toe webspace continues to contract using silver alginate and ketoconazole 02/28/18 ooLeft ischial tuberosity wounds look better using silver alginate. ooOriginal wound on the left calf only has one small open area left using Hydrofera Blue ooDTI on the left heel required debridement mostly removing skin from around this wound surface. Using silver alginate ooThe areas on the right first/second toe web space using silver alginate and ketoconazole 03/08/18 on evaluation today patient appears to be doing decently well as best I can tell in regard to his wounds. This is the first time that I have seen him as he generally is followed by Dr. Dellia Nims. With that being said none of his wounds appear to be infected he does have an area where there is some skin covering what appears to be a new wound on the left dorsal surface of his great toe. This is right at the nail bed. With that being said I do believe that debrided away some of the excess skin can be of benefit in this regard. Otherwise  he has been tolerating the dressing changes without complication. 03/14/18; patient arrives today with the multiplicity of wounds that we are following. He has not been systemically unwell ooOriginal wound on the left lateral calf now only has 2 small open areas we've been using Hydrofera Blue which should continue ooThe deep tissue injury on the left heel requires debridement today. We've been using silver alginate ooThe left first second toe and the right first second toe are both are reminiscence what I think was tinea pedis. Apparently some of the callus Surface between the toes was removed last week when it started draining. ooPurulent drainage coming from the wound on the ischial tuberosity on the left. 03/21/18-He is here in follow-up evaluation for multiple wounds. 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 He tolerated debridement. The only change to last week's recommendations is to discontinue antifungal cream between toes. He will follow-up next week 03/28/18; following up for  multiple wounds;Concern this week is streaking redness and swelling in the right foot. He is going to need antibiotics for this. 03/31/18; follow-up for right foot cellulitis. Streaking redness and swelling in the right foot on 03/28/18. 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 I put him on clindamycin 300 mg every 6 and brought him in for a quick check. He has an open wound between his first and second toes on the right foot as a potential source. 04/04/18; ooRight foot cellulitis is resolving he is completing clindamycin. This is truly good news ooLeft lateral calf wound which is initial wound only has one small open area inferiorly this is close to healing out. He has compression stockings. We will use Hydrofera Blue right down to the epithelialization of this ooNonviable surface on the left heel which was  initially pressure with a DTI. We've been using Hydrofera Blue. I'm going to switch this back to silver alginate ooLeft first second toe/tinea pedis this looks better using silver alginate ooRight first second toe tinea pedis using silver alginate ooLarge pressure ulcers on theLeft ischial tuberosity. Small wound here Looks better. I am uncertain about the surface over the large wound. Using silver alginate 04/11/18; ooCellulitis in the right foot is resolved ooLeft lateral calf wound which was his original wounds still has 2 tiny open areas remaining this is just about closed ooNonviable surface on the left heel is better but still requires debridement ooLeft first second toe/tinea pedis still open using silver alginate ooRight first second toe wound tinea pedis I asked him to go back to using ketoconazole and silver alginate ooLarge pressure ulcers on the left ischial tuberosity this shear injury here is resolved. Wound is smaller. No evidence of infection using silver alginate 04/18/18; ooPatient arrives with an intense area of cellulitis in the right mid lower calf extending into the right heel area. Bright red and warm. Smaller area on the left anterior leg. He has a significant history of MRSA. He will definitely need antibioticsoodoxycycline ooHe now has 2 open areas on the left ischial tuberosity the original large wound and now a satellite area which I think was above his initial satellite areas. Not a wonderful surface on this satellite area surrounding erythema which looks like pressure related. ooHis left lateral calf wound again his original wound is just about closed ooLeft heel pressure injury still requiring debridement ooLeft first second toe looks a lot better using silver alginate ooRight first second toe also using silver alginate and ketoconazole cream also looks better 04/20/18; the patient was worked in early today out of concerns with his cellulitis on  the right leg. I had started him on doxycycline. This was 2 days ago. His wife was concerned about the swelling in the area. Also concerned about the left buttock. He has not been systemically unwell no fever chills. No nausea vomiting or diarrhea 04/25/18; the patient's left buttock wound is continued to deteriorate he is using Hydrofera Blue. He is still completing clindamycin for the cellulitis on the right leg although all of this looks better. 05/02/18 ooLeft buttock wound still with a lot of drainage and a very tightly adherent fibrinous necrotic surface. He has a deeper area superiorly ooThe left lateral calf wound is still closed ooDTI wound on the left heel necrotic surface especially the circumference using Iodoflex ooAreas between his left first second toe and right first second toe both look better. Dorsally and the right first second toe he had a necrotic surface  although at smaller. In using silver alginate and ketoconazole. I did a culture last week which was a deep tissue culture of the reminiscence of the open wound on the right first second toe dorsally. This grew a few Acinetobacter and a few methicillin-resistant staph aureus. Nevertheless the area actually this week looked better. I didn't feel the need to specifically address this at least in terms of systemic antibiotics. 05/09/18; wounds are measuring larger more drainage per our intake. We are using Santyl covered with alginate on the large superficial buttock wounds, Iodosorb on the left heel, ketoconazole and silver alginate to the dorsal first and second toes bilaterally. 05/16/18; ooThe area on his left buttock better in some aspects although the area superiorly over the ischial tuberosity required an extensive debridement.using Santyl ooLeft heel appears stable. Using Iodoflex ooThe areas between his first and second toes are not bad however there is spreading erythema up the dorsal aspect of his left foot this  looks like cellulitis again. He is insensate the erythema is really very brilliant.o Erysipelas He went to [name] allergist days ago because he was itching part of this he had lab work done. This showed a white count of 15.1 with 70% neutrophils. Hemoglobin of 11.4 and a platelet count of 659,000. Last white count we had in Epic was a 2-1/2 years ago which was 25.9 but he was ill at the time. He was able to show me some lab work that was done by his primary physician the pattern is about the same. 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 But prompted me to go ahead and do x-rays of both feet and the pelvis rule out osteomyelitis. He also had a comprehensive metabolic panel this was reasonably normal his albumin was 3.7 liver function tests BUN/creatinine all normal 05/23/18; x-rays of both his feet from last week were negative for underlying pulmonary abnormality. The x-ray of his pelvis however showed mild irregularity in the left ischial which may represent some early osteomyelitis. The wound in the left ischial continues to get deeper clearly now exposed muscle. Each week necrotic surface material over this area. Whereas the rest of the wounds do not look so bad. ooThe left ischial wound we have been using Santyl and calcium alginate ooTo the left heel surface necrotic debris using Iodoflex ooThe left lateral leg is still healed ooAreas on the left dorsal foot and the right dorsal foot are about the same. There is some inflammation on the left which might represent contact dermatitis, fungal dermatitis I am doubtful cellulitis although this looks better than last week 05/30/18; CT scan done at Hospital did not show any osteomyelitis or abscess. Suggested the possibility of underlying cellulitis although I don't [name] a lot of evidence of this at the bedside ooThe wound itself on the left buttock/upper thigh actually looks somewhat better. No  debridement ooLeft heel also looks better no debridement continue Iodoflex ooBoth dorsal first second toe spaces appear better using Lotrisone. Left still required debridement 06/06/18; ooIntake reported some purulent looking drainage from the left gluteal wound. Using Santyl and calcium alginate ooLeft heel looks better although still a nonviable surface requiring debridement ooThe left dorsal foot first/second webspace actually expanding and somewhat deeper. I may consider doing a shave biopsy of this area ooRight dorsal foot first/second webspace appears stable to improved. Using Lotrisone and silver alginate to both these areas 06/13/18 ooLeft gluteal surface looks better. Now separated in the  2 wounds. No debridement required. Still drainage. We'll continue silver alginate ooLeft heel continues to look better with Iodoflex continue this for at least another week ooOf his dorsal foot wounds the area on the left still has some depth although it looks better than last week. We've been using Lotrisone and silver alginate 06/20/18 ooLeft gluteal continues to look better healthy tissue ooLeft heel continues to look better healthy granulation wound is smaller. He is using Iodoflex and his long as this continues continue the Iodoflex ooDorsal right foot looks better unfortunately dorsal left foot does not. There is swelling and erythema of his forefoot. He had minor trauma to this several days ago but doesn't think this was enough to have caused any tissue injury. Foot looks like cellulitis, we have had this problem before 06/27/18 on evaluation today patient appears to be doing a little worse in regard to his foot ulcer. 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 Both of which would really be his only options for treating this infection. In the  past he has been given and effusion of Orbactiv. This is done very well for him in the past again it's one time dosing IV antibiotic therapy. Subsequently I do believe this is something we're gonna need to [name] about doing at this point in time. Currently his other wounds seem to be doing somewhat better in my pinion I'm pretty happy in that regard. 07/03/18 on evaluation today patient's wounds actually appear to be doing fairly well. He has been tolerating the dressing changes without complication. All in all he seems to be showing signs of improvement. In regard to the antibiotics he has been dealing with infectious disease since I saw him last week as far as getting this scheduled. In the end [facility]'s [facility] this coming Friday. In the meantime he has been continuing to perform the dressing changes in such as previous. There does not appear to be any evidence of infection worsengin at this time. 07/10/18; ooSince I last saw this man 2 weeks ago things have actually improved. IV antibiotics of resulted in less forefoot erythema although there is still some present. He is not systemically unwell ooLeft buttock wounds o2 now have no depth there is increased epithelialization Using silver alginate ooLeft heel still requires debridement using Iodoflex ooLeft dorsal foot still with a sizable wound about the size of a border but healthy granulation ooRight dorsal foot still with a slitlike area using silver alginate 07/18/18; the patient's cellulitis in the left foot is improved in fact I think it is on its way to resolving. ooLeft buttock wounds o2 both look better although the larger one has hypertension granulation we've been using silver alginate ooLeft heel has some thick circumferential redundant skin over the wound edge which will need to be removed today we've been using Iodoflex ooLeft dorsal foot is still a sizable wound  required debridement using silver alginate ooThe right dorsal foot is just about closed only a small open area remains here 07/25/18; left foot cellulitis is resolved ooLeft buttock wounds o2 both look better. Hyper-granulation on the major area ooLeft heel as some debris over the surface but otherwise looks a healthier wound. Using silver collagen ooRight dorsal foot is just about closed 07/31/18; arrives with our intake nurse worried about  purulent drainage from the buttock. We had hyper-granulation here last week ooHis buttock wounds o2 continue to look better ooLeft heel some debris over the surface but measuring smaller. ooRight dorsal foot unfortunately has openings between the toes ooLeft foot superficial wound looks less aggravated. 08/07/18 ooButtock wounds continue to look better although some of her granulation and the larger medial wound. silver alginate ooLeft heel continues to look a lot better.silver collagen ooLeft foot superficial wound looks less stable. Requires debridement. He has a new wound superficial area on the foot on the lateral dorsal foot. ooRight foot looks better using silver alginate without Lotrisone 08/14/2018; patient was in the ER last week diagnosed with a UTI. He is now on Cefpodoxime and Macrodantin. ooButtock wounds continued to be smaller. Using silver alginate ooLeft heel continues to look better using silver collagen ooLeft foot superficial wound looks as though it is improving ooRight dorsal foot area is just about healed. 08/21/2018; patient is completed his antibiotics for his UTI. ooHe has 2 open areas on the buttocks. There is still not closed although the surface looks satisfactory. Using silver alginate ooLeft heel continues to improve using silver collagen ooThe bilateral dorsal foot areas which are at the base of his first and second toes/possible tinea pedis are actually stable on the left but worse on the right. The area  on the left required debridement of necrotic surface. After debridement I obtained a specimen for PCR culture. ooThe right dorsal foot which is been just about healed last week is now reopened 08/28/2018; culture done on the left dorsal foot showed coag negative staph both staph epidermidis and Lugdunensis. I think this is worthwhile initiating systemic [name]. I will use doxycycline given his long list of allergies. The area on the left heel slightly improved but still requiring debridement. ooThe large wound on the buttock is just about closed whereas the smaller one is larger. Using silver alginate in this area 09/04/2018; patient is completing his doxycycline for the left foot although this continues to be a very difficult wound area with very adherent necrotic debris. We are using silver alginate to all his wounds right foot left foot and the small wounds on his buttock, silver collagen on the left heel. 09/11/2018; once again this patient has intense erythema and swelling of the left forefoot. Lesser degrees of erythema in the right foot. He has a long list of allergies and intolerances. I will reinstitute doxycycline. 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 Using silver alginate ooLeft heel also looks better using silver collagen ooUnfortunately both the areas on his feet look worse. The area on the left first second webspace is now gone through to the plantar part of his foot. The area on the left foot anteriorly is irritated with erythema and swelling in the forefoot. 09/25/2018 ooHis wound on the left plantar heel looks better. Using silver collagen ooThe area on the left buttock 2 small remnant areas. One is closed one is still open. Using silver alginate ooThe areas between both his first and second toes look worse. This in spite of long-standing antifungal therapy with ketoconazole and silver alginate which should have  antifungal activity ooHe has small areas around his original wound on the left calf one is on the bottom of the original scar tissue and one superiorly both of these are small and superficial but again given wound history in this site this is worrisome 10/02/2018 ooLeft plantar heel  continues to gradually contract using silver collagen ooLeft buttock wound is unchanged using silver alginate ooThe areas on his dorsal feet between his first and second toes bilaterally look about the same. 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 Small open area here. We have been using silver alginate 10/09/2018; ooLeft plantar heel continues to look healthy and contract. No debridement is required ooLeft buttock slightly smaller with a tape injury wound just below which was new this week ooDorsal feet somewhat improved I have been using clindamycin ooLeft lateral looks lower extremity the actual open area looks worse although a lot of this is epithelialized. I am going to change to silver collagen today He has a lot more swelling in the right leg although this is not pitting not red and not particularly warm there is a lot of spasm in the right leg usually indicative of people with paralysis of some underlying discomfort. We have reviewed his vascular status from 2017 he had a left greater saphenous vein ablation. I wonder about referring him back to vascular surgery if the area on the left leg continues to deteriorate. 10/16/2018 in today for follow-up and management of multiple lower extremity ulcers. His left Buttock wound is much lower smaller and almost closed completely. The wound to the left ankle has began to reopen with Epithelialization and some adherent slough. He has multiple new areas to the left foot and leg.  The left dorsal foot without much improvement. Wound present between left great webspace and 2nd toe. Erythema and edema present right leg. Right LE ultrasound obtained on 10/10/18 was negative for DVT. 10/23/2018; ooLeft buttock is closed over. Still dry macerated skin but there is no open wound. I suspect this is chronic pressure/moisture ooLeft lateral calf is quite a bit worse than when I saw this last. There is clearly drainage here he has macerated skin into the left plantar heel. We will change the primary dressing to alginate ooLeft dorsal foot has some improvement in overall wound area. Still using clindamycin and silver alginate ooRight dorsal foot about the same as the left using clindamycin and silver alginate ooThe erythema in the right leg has resolved. He is DVT rule out was negative ooLeft heel pressure area required debridement although the wound is smaller and the surface is health 10/26/2018 ooThe patient came back in for his nurse check today predominantly because of the drainage coming out of the left lateral leg with a recent reopening of his original wound on the left lateral calf. He comes in today with a large amount of surrounding erythema around the wound extending from the calf into the ankle and even in the area on the dorsal foot. He is not systemically unwell. He is not febrile. Nevertheless this looks like cellulitis. We have been using silver alginate to the area. I changed him to a regular visit and I am going to prescribe him doxycycline. The rationale here is a long list of medication intolerances and a history of MRSA. I did not [name] anything that I thought would provide a valuable culture 10/30/2018 ooFollow-up from his appointment 4 days ago with really an extensive area of cellulitis in the left calf left lateral ankle and left dorsal foot. I put him on doxycycline. He has a long list of medication allergies which are true allergy reactions. Also  concerning since the MRSA he has cultured in the past I think episodically has been tetracycline resistant. In any case he is a lot better today. The erythema especially in the anterior and lateral left calf is better. He still has left ankle erythema. He also is complaining about increasing edema in the right leg we have only been using Kerlix Coban and he has been doing the wraps at home. Finally he has a spotty rash on the medial part of his upper left calf which looks like folliculitis or perhaps wrap occlusion type injury. Small superficial macules not pustules 11/06/18 patient arrives today with again a considerable degree of erythema around the wound on the left lateral calf extending into the dorsal ankle and dorsal foot. This is a lot worse than when I saw this last week. He is on doxycycline really with not a lot of improvement. He has not been systemically unwell Wounds on the; left heel actually looks improved. Original area on the left foot and proximity to the first and second toes looks about the same. He has superficial areas on the dorsal foot, anterior calf and then the reopening of his original wound on the left lateral calf which looks about the same ooThe only area he has on the right is the dorsal webspace first and second which is smaller. ooHe has a large area of dry erythematous skin on the left buttock small open area here. 11/13/2018; the patient arrives in much better condition. The erythema around the wound on the left lateral calf is a lot better. Not sure whether this was the clindamycin or the TCA and ketoconazole or just in the improvement in edema control [stasis dermatitis]. In any case this is a lot better. The area on the left heel is very small and just about resolved using silver collagen we have been using silver alginate to the areas on his dorsal feet 11/20/2018; his wounds include the left lateral calf, left heel, dorsal aspects of both feet just  proximal to the first second webspace. He is stable to slightly improved. I did not think any changes to his dressings were going to be necessary 11/27/2018 he has a reopening on the left buttock which is surrounded by what looks like tinea or perhaps some other form of dermatitis. The area on the left dorsal foot has some erythema around it I have marked this area but I am not sure whether this is cellulitis or not. Left heel is not closed. Left calf the reopening is really slightly longer and probably worse 1/13; in general things look better and smaller except for the left dorsal foot. Area on the left heel is just about closed, left buttock looks better only a small wound remains in the skin looks better [using Lotrisone] 1/20; the area on the left heel only has a few remaining open areas here. Left lateral calf about the same in terms of size, left dorsal foot slightly larger right lateral foot still not closed. The area on the left buttock has no open wound and the surrounding skin looks a lot better 1/27; the area on the left heel is closed. Left lateral calf better but still requiring extensive debridements. The area on his left buttock is closed. He still has the open areas on the left dorsal foot which is slightly smaller in the right foot which is slightly expanded. We have been using Iodoflex on these areas as well 2/3; left heel is closed. Left lateral calf still requiring  debridement using Iodoflex there is no open area on his left buttock however he has dry scaly skin over a large area of this. Not really responding well to the Lotrisone. Finally the areas on his dorsal feet at the level of the first second webspace are slightly smaller on the right and about the same on the left. Both of these vigorously debrided with Anasept and gauze 2/10; left heel remains closed he has dry erythematous skin over the left buttock but there is no open wound here. Left lateral leg has come in and  with. Still requiring debridement we have been using Iodoflex here. Finally the area on the left dorsal foot and right dorsal foot are really about the same extremely dry callused fissured areas. He does not yet have a dermatology appointment 2/17; left heel remains closed. He has a new open area on the left buttock. The area on the left lateral calf is bigger longer and still covered in necrotic debris. No major change in his foot areas bilaterally. I am awaiting for a dermatologist to look on this. We have been using ketoconazole I do not know that this is been doing any good at all. 2/24; left heel remains closed. The left buttock wound that was new reopening last week looks better. The left lateral calf appears better also although still requires debridement. The major area on his foot is the left first second also requiring debridement. We have been putting Prisma on all wounds. I do not believe that the ketoconazole has done too much good for his feet. He will use Lotrisone I am going to give him a 2-week course of terbinafine. We still do not have a dermatology appointment 3/2 left heel remains closed however there is skin over bone in this area I pointed this out to him today. The left buttock wound is epithelialized but still does not look completely stable. The area on the left leg required debridement were using silver collagen here. With regards to his feet we changed to Lotrisone last week and silver alginate. 3/9; left heel remains closed. Left buttock remains closed. The area on the right foot is essentially closed. The left foot remains unchanged. Slightly smaller on the left lateral calf. Using silver collagen to both of these areas 3/16-Left heel remains closed. Area on right foot is closed. Left lateral calf above the lateral malleolus open wound requiring debridement with easy bleeding. Left dorsal wound proximal to first toe also debrided. Left ischial area open new. Patient  has been using Prisma with wrapping every 3 days. Dermatology appointment is apparently tomorrow.Patient has completed his terbinafine 2-week course with some apparent improvement according to him, there is still flaking and dry skin in his foot on the left 3/23; area on the right foot is reopened. The area on the left anterior foot is about the same still a very necrotic adherent surface. He still has the area on the left leg and reopening is on the left buttock. He apparently saw dermatology although I do not have a note. According to the patient who is usually fairly well informed they did not have any good ideas. Put him on oral terbinafine which she is been on before. 3/30; using silver collagen to all wounds. Apparently his dermatologist put him on doxycycline and rifampin presumably some culture grew staph. I do not have this result. He remains on terbinafine although I have used terbinafine on him before 4/6; patient has had a fairly substantial reopening on the  right foot between the first and second toes. He is finished his terbinafine and I believe is on doxycycline and rifampin still as prescribed by dermatology. We have been using silver collagen to all his wounds although the patient reports that he thinks silver alginate does better on the wounds on his buttock. 4/13; the area on his left lateral calf about the same size but it did not require debridement. ooLeft dorsal foot just proximal to the webspace between the first and second toes is about the same. Still nonviable surface. I note some superficial bronze discoloration of the dorsal part of his foot ooRight dorsal foot just proximal to the first and second toes also looks about the same. I still think there may be the same discoloration I noted above on the left ooLeft buttock wound looks about the same 4/20; left lateral calf appears to be gradually contracting using silver collagen. ooHe remains on erythromycin empiric  [name] for possible erythrasma involving his digital spaces. The left dorsal foot wound is debrided of tightly adherent necrotic debris and really cleans up quite nicely. The right area is worse with expansion. I did not debride this it is now over the base of the second toe ooThe area on his left buttock is smaller no debridement is required using silver collagen 5/4; left calf continues to make good progress. ooHe arrives with erythema around the wounds on his dorsal foot which even extends to the plantar aspect. Very concerning for coexistent infection. He is finished the erythromycin I gave him for possible erythrasma this does not seem to have helped. ooThe area on the left foot is about the same base of the dorsal toes ooIs area on the buttock looks improved on the left 5/11; left calf and left buttock continued to make good progress. Left foot is about the same to slightly improved. ooMajor problem is on the right foot. He has not had an x-ray. Deep tissue culture I did last week showed both Enterobacter and E. coli. I did not change the doxycycline I put him on empirically although neither 1 of these were plated to doxycycline. He arrives today with the erythema looking worse on both the dorsal and plantar foot. Macerated skin on the bottom of the foot. he has not been systemically unwell 5/18-Patient returns at 1 week, left calf wound appears to be making some progress, left buttock wound appears slightly worse than last time, left foot wound looks slightly better, right foot redness is marginally better. X-ray of both feet show no air or evidence of osteomyelitis. Patient is finished his Omnicef and terbinafine. He continues to have macerated skin on the bottom of the left foot as well as right 5/26; left calf wound is better, left buttock wound appears to have multiple small superficial open areas with surrounding macerated skin. X-rays that I did last time showed no evidence  of osteomyelitis in either foot. He is finished cefdinir and doxycycline. I do not think that he was on terbinafine. He continues to have a large superficial open area on the right foot anterior dorsal and slightly between the first and second toes. I did send him to dermatology 2 months ago or so wondering about whether they would do a fungal scraping. I do not believe they did but did do a culture. We have been using silver alginate to the toe areas, he has been using antifungals at home topically either ketoconazole or Lotrisone. We are using silver collagen on the left foot, silver  alginate on the right, silver collagen on the left lateral leg and silver alginate on the left buttock 6/1; left buttock area is healed. We have the left dorsal foot, left lateral leg and right dorsal foot. We are using silver alginate to the areas on both feet and silver collagen to the area on his left lateral calf 6/8; the left buttock apparently reopened late last week. He is not really sure how this happened. He is tolerating the terbinafine. Using silver alginate to all wounds 6/15; left buttock wound is larger than last week but still superficial. ooCame in the clinic today with a report of purulence from the left lateral leg I did not identify any infection ooBoth areas on his dorsal feet appear to be better. He is tolerating the terbinafine. Using silver alginate to all wounds 6/22; left buttock is about the same this week, left calf quite a bit better. His left foot is about the same however he comes in with erythema and warmth in the right forefoot once again. Culture that I gave him in the beginning of May showed Enterobacter and E. coli. I gave him doxycycline and things seem to improve although neither 1 of these organisms was specifically plated. 6/29; left buttock is larger and dry this week. Left lateral calf looks to me to be improved. Left dorsal foot also somewhat improved right foot completely  unchanged. The erythema on the right foot is still present. He is completing the Ceftin dinner that I gave him empirically [[name] discussion above.) 7/6 - All wounds look to be stable and perhaps improved, the left buttock wound is slightly smaller, per patient bleeds easily, completed ceftin, the right foot redness is less, he is on terbinafine 7/13; left buttock wound about the same perhaps slightly narrower. Area on the left lateral leg continues to narrow. Left dorsal foot slightly smaller right foot about the same. We are using silver alginate on the right foot and Hydrofera Blue to the areas on the left. Unna boot on the left 2 layer compression on the right 7/20; left buttock wound absolutely the same. Area on lateral leg continues to get better. Left dorsal foot require debridement as did the right no major change in the 7/27; left buttock wound the same size necrotic debris over the surface. The area on the lateral leg is closed once again. His left foot looks better right foot about the same although there is some involvement now of the posterior first second toe area. He is still on terbinafine which I have given him for a month, not certain a centimeter major change 06/25/19-All wounds appear to be slightly improved according to report, left buttock wound looks clean, both foot wounds have minimal to no debris the right dorsal foot has minimal slough. We are using Hydrofera Blue to the left and silver alginate to the right foot and ischial wound. 8/10-Wounds all appear to be around the same, the right forefoot distal part has some redness which was not there before, however the wound looks clean and small. Ischial wound looks about the same with no changes 8/17; his wound on the left lateral calf which was his original chronic venous insufficiency wound remains closed. Since I last saw him the areas on the left dorsal foot right dorsal foot generally appear better but require debridement.  The area on his left initial tuberosity appears somewhat larger to me perhaps hyper granulated and bleeds very easily. We have been using Hydrofera Blue to the left  dorsal foot and silver alginate to everything else 8/24; left lateral calf remains closed. The areas on his dorsal feet on the webspace of the first and second toes bilaterally both look better. The area on the left buttock which is the pressure ulcer stage II slightly smaller. I change the dressing to Hydrofera Blue to all areas 8/31; left lateral calf remains closed. The area on his dorsal feet bilaterally look better. Using Hydrofera Blue. Still requiring debridement on the left foot. No change in the left buttock pressure ulcers however 9/14; left lateral calf remains closed. Dorsal feet look quite a bit better than 2 weeks ago. Flaking dry skin also a lot better with the ammonium lactate I gave him 2 weeks ago. The area on the left buttock is improved. He states that his Roho cushion developed a leak and he is getting a new one, in the interim he is offloading this vigorously 9/21; left calf remains closed. Left heel which was a possible DTI looks better this week. He had macerated tissue around the left dorsal foot right foot looks satisfactory and improved left buttock wound. I changed his dressings to his feet to silver alginate bilaterally. Continuing Hydrofera Blue on the left buttock. 9/28 left calf remains closed. Left heel did not develop anything [possible DTI] dry flaking skin on the left dorsal foot. Right foot looks satisfactory. Improved left buttock wound. We are using silver alginate on his feet Hydrofera Blue on the buttock. I have asked him to go back to the Lotrisone on his feet including the wounds and surrounding areas 10/5; left calf remains closed. The areas on the left and right feet about the same. A lot of this is epithelialized however debris over the remaining open areas. He is using Lotrisone and silver  alginate. The area on the left buttock using Hydrofera Blue 10/26. Patient has been out for 3 weeks secondary to Covid concerns. He tested negative but I think his wife tested positive. He comes in today with the left foot substantially worse, right foot about the same. Even more concerning he states that the area on his left buttock closed over but then reopened and is considerably deeper in one aspect than it was before [stage III wound] 11/2; left foot really about the same as last week. Quarter sized wound on the dorsal foot just proximal to the first second toes. Surrounding erythema with areas of denuded epithelium. This is not really much different looking. Did not look like cellulitis this time however. ooRight foot area about the same.. We have been using silver alginate alginate on his toes ooLeft buttock still substantial irritated skin around the wound which I think looks somewhat better. We have been using Hydrofera Blue here. 11/9; left foot larger than last week and a very necrotic surface. Right foot I think is about the same perhaps slightly smaller. Debris around the circumference also addressed. Unfortunately on the left buttock there is been a decline. Satellite lesions below the major wound distally and now a an additional one posteriorly we have been using Hydrofera Blue but I think this is a pressure issue 11/16; left foot ulcer dorsally again a very adherent necrotic surface. Right foot is about the same. Not much change in the pressure ulcer on his left buttock. 11/30; left foot ulcer dorsally basically the same as when I saw him 2 weeks ago. Very adherent fibrinous debris on the wound surface. Patient reports a lot of drainage as well. The character of this  wound has changed completely although it has always been refractory. We have been using Iodoflex, patient changed back to alginate because of the drainage. Area on his right dorsal foot really looks benign with a  healthier surface certainly a lot better than on the left. Left buttock wounds all improved using Hydrofera Blue 12/7; left dorsal foot again no improvement. Tightly adherent debris. PCR culture I did last week only showed likely skin contaminant. I have gone ahead and done a punch biopsy of this which is about the last thing in terms of investigations I can think to do. He has known venous insufficiency and venous hypertension and this could be the issue here. The area on the right foot is about the same left buttock slightly worse according to our intake nurse secondary to [facility] Blue sticking to the wound 12/14; biopsy of the left foot that I did last time showed changes that could be related to wound healing/chronic stasis dermatitis phenomenon no neoplasm. We have been using silver alginate to both feet. I change the one on the left today to Sorbact and silver alginate to his other 2 wounds Objective Constitutional Sitting or standing Blood Pressure is within target range for patient.. Pulse regular and within target range for patient.Marland Kitchen Respirations regular, non-labored and within target range.. Temperature is normal and within the target range for the patient.Marland Kitchen Appears in no distress. Vitals Time Taken: 7:52 AM, Height: 70 in, Weight: 216 lbs, BMI: 31, Temperature: 98 F, Pulse: 98 bpm, Respiratory Rate: 20 breaths/min, Blood Pressure: 131/68 mmHg. Eyes Conjunctivae clear. No discharge.no icterus. Respiratory work of breathing is normal. General Notes: Wound exam ooOn the left absolutely no change deeper punched out wound. Covered and tightly adherent fibrinous debris. Very difficult to debride with a #5 curette. Hemostasis with direct pressure. Integumentary (Hair, Skin) Wound #17 status is Open. Original cause of wound was Gradually Appeared. The wound is located on the Right,Dorsal Toe - Web between 1st and 2nd. The wound measures 4.5cm length x 2cm width x 0.1cm  depth; 7.069cm^2 area and 0.707cm^3 volume. There is Fat Layer (Subcutaneous Tissue) Exposed exposed. There is no tunneling or undermining noted. There is a medium amount of serosanguineous drainage noted. The wound margin is distinct with the outline attached to the wound base. There is large (67-100%) pale granulation within the wound bed. There is a small (1-33%) amount of necrotic tissue within the wound bed including Adherent Slough. Wound #24 status is Open. Original cause of wound was Gradually Appeared. The wound is located on the Left,Dorsal Foot. The wound measures 3cm length x 2.5cm width x 0.3cm depth; 5.89cm^2 area and 1.767cm^3 volume. There is Fat Layer (Subcutaneous Tissue) Exposed exposed. There is no tunneling or undermining noted. There is a medium amount of purulent drainage noted. The wound margin is distinct with the outline attached to the wound base. There is no granulation within the wound bed. There is a large (67-100%) amount of necrotic tissue within the wound bed including Adherent Slough. Wound #35 status is Open. Original cause of wound was Gradually Appeared. The wound is located on the Left Ischium. The wound measures 3cm length x 0.5cm width x 0.1cm depth; 1.178cm^2 area and 0.118cm^3 volume. There is Fat Layer (Subcutaneous Tissue) Exposed exposed. There is no tunneling or undermining noted. There is a medium amount of serosanguineous drainage noted. The wound margin is flat and intact. There is large (67-100%) red, friable granulation within the wound bed. There is no necrotic tissue within the  wound bed. 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 There was a Excisional Skin/Subcutaneous Tissue Debridement with a total area of 7.5 sq cm performed by [name] [name]., MD. With the following instrument(s): Curette to remove Viable and Non-Viable tissue/material. Material removed includes Subcutaneous Tissue and Slough and. No specimens were taken. A time out was conducted at 08:23, prior to the start of the procedure. A Minimum amount of bleeding was controlled with Pressure. The procedure was tolerated well with a pain level of 0 throughout and a pain level of 0 following the procedure. Post Debridement Measurements: 3cm length x 2.5cm width x 0.3cm depth; 1.767cm^3 volume. Character of Wound/Ulcer Post Debridement requires further debridement. Post procedure Diagnosis Wound #24: Same as Pre-Procedure Plan Follow-up Appointments: Return Appointment in 2 weeks. Dressing Change Frequency: Wound #17 Right,Dorsal Toe - Web between 1st and 2nd: Change Dressing every other day. Wound #24 Left,Dorsal Foot: Change Dressing every other day. Wound #35 Left Ischium: Change Dressing every other day. Skin Barriers/Peri-Wound Care: Antifungal cream - on toes on both feet daily Moisturizing lotion - both legs Other: - Triamcinolone cream Wound #35 Left Ischium: Barrier cream Wound Cleansing: Wound #17 Right,Dorsal Toe - Web between 1st and 2nd: Clean wound with Wound Cleanser Primary Wound Dressing: Wound #17 Right,Dorsal Toe - Web between 1st and 2nd: Calcium Alginate with Silver Wound #24 Left,Dorsal Foot: Other: - Sorbact Wound #35 Left Ischium: Calcium Alginate with Silver Secondary Dressing: Wound #17 Right,Dorsal Toe - Web between 1st and 2nd: Kerlix/Rolled Gauze Dry Gauze Wound #24 Left,Dorsal Foot: Kerlix/Rolled Gauze Dry Gauze Heel Cup - add heel cup to left heel for protection. Wound #35 Left Ischium: Foam Border Edema Control: Elevate legs to the level of the heart or above for 30 minutes daily  and/or when sitting, a frequency of: - throughout the day Support Garment 30-40 mm/Hg pressure to: - Juxtalite to both legs. Off-Loading: Low air-loss mattress (Group 2) Roho cushion for wheelchair Turn and reposition every 2 hours - out of wheelchair throughout the day, try to lay on sides, sleep in the bed not the recliner 1. I change the dressing on the left foot to Sorbact 2. Silver alginate to the right foot and the left buttock. Left buttock looks improved. Still the same excoriated look to the skin around there however. They are using zinc oxide Electronic Signature(s) Signed: 11/05/2019 5:56:50 PM By: [name] Ham MD Entered By: [name] [name] on 11/05/2019 09:14:43 -------------------------------------------------------------------------------- SuperBill Details Patient Name: Date of Service: CRIBBGrace Bushy 11/05/2019 Medical Record [id number] Patient Account Number: [id number] Date of Birth/Sex: Treating RN: [birth year] (33 y.o. [name] [name] Primary Care Provider: [place], [place] Other Clinician: Referring Provider: Treating Provider/Extender:Shiven Junious, [name] [name], [name] [name]: 200 Diagnosis Coding ICD-10 Codes Code Description L97.511 Non-pressure chronic ulcer of other part of right 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 L97.521 Non-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, complete 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, stage 3 Facility Procedures CPT4 Code Description: [id number] - DEB SUBQ TISSUE 20 SQ CM/< ICD-10 Diagnosis Description L97.521 Non-pressure chronic ulcer of other part of left foot limit Modifier: ed to breakdow Quantity: 1  n of skin Physician Procedures CPT4 Code Description: [id number] 11042 - WC PHYS SUBQ TISS 20 SQ CM ICD-10 Diagnosis Description L97.521 Non-pressure chronic ulcer of other part of left foot limit Modifier: ed to breakdow Quantity: 1 n of  skin Electronic Signature(s) Signed: 11/05/2019 5:56:50 PM By: [name] Ham MD Entered By: [name] [name] on 11/05/2019 09:15:22

## 2019-11-05 NOTE — Progress Notes (Addendum)
Oleary, [name] ([id number]) Visit Report for 11/05/2019 Arrival Information Details Patient Name: Date of Service: [name], [name] 11/05/2019 7:30 AM Medical Record [id number] Patient Account Number: [id number] Date of Birth/Sex: Treating RN: 05-06-1986 (33 y.o. [name] Primary Care Tahani Potier: [place], [place] Other Clinician: Referring Taytum Wheller: Treating Britania Shreeve/Extender:Robson, [name], [name]: 200 Visit Information History Since Last Visit Added or deleted any medications: No Patient Arrived: Wheel Chair Any new allergies or adverse reactions: No Arrival Time: 07:50 Had a fall or experienced change in No activities of daily living that may affect Accompanied By: self risk of falls: Transfer Assistance: None Signs or symptoms of abuse/neglect since last No Patient Identification Verified: Yes visito Secondary Verification Process Completed: Yes Hospitalized since last visit: No Patient Requires Transmission-Based No Implantable device outside of the clinic excluding No Precautions: cellular tissue based products placed in the center Patient Has Alerts: Yes since last visit: Patient Alerts: R ABI = Has Dressing in Place as Prescribed: Yes 1.0 Has Compression in Place as Prescribed: Yes L ABI = Pain Present Now: No 1.1 Electronic Signature(s) Signed: 11/05/2019 5:58:58 PM By: Deon Pilling Entered By: Deon Pilling on 11/05/2019 07:52:39 -------------------------------------------------------------------------------- Encounter Discharge Information Details Patient Name: Date of Service: [name] 11/05/2019 7:30 AM Medical Record [id number] Patient Account Number: [id number] Date of Birth/Sex: Treating RN: 07/20/1986 (33 y.o. [name] Primary Care Jaselynn Tamas: [name], [name] Other Clinician: Referring Aerilyn Slee: Treating Greenly Rarick/Extender:Robson, [name], [name]: 200 Encounter Discharge  Information Items Post Procedure Vitals Discharge Condition: Stable Temperature (F): 98 Ambulatory Status: Wheelchair Pulse (bpm): 98 Discharge Destination: Home Respiratory Rate (breaths/min): 20 Transportation: Private Auto Blood Pressure (mmHg): 131/68 Accompanied By: self Schedule Follow-up Appointment: Yes Clinical Summary of Care: Electronic Signature(s) Signed: 11/05/2019 5:58:58 PM By: Deon Pilling Entered By: Deon Pilling on 11/05/2019 08:43:22 -------------------------------------------------------------------------------- Lower Extremity Assessment Details Patient Name: Date of Service: [name] 11/05/2019 7:30 AM Medical Record [id number] Patient Account Number: [id number] Date of Birth/Sex: Treating RN: 10/09/1986 (32 y.o. [name] Primary Care Hamlin Devine: [name], [name] Other Clinician: Referring Billye Nydam: Treating Langston Tuberville/Extender:Robson, [name], [name]: 200 Edema Assessment Assessed: [Left: No] [Right: Yes] Edema: [Left: No] [Right: No] Calf Left: Right: Point of Measurement: 33 cm From Medial Instep 28.5 cm 33 cm Ankle Left: Right: Point of Measurement: 10 cm From Medial Instep 23.5 cm 23.5 cm Electronic Signature(s) Signed: 11/05/2019 5:58:58 PM By: Deon Pilling Entered By: Deon Pilling on 11/05/2019 07:56:45 -------------------------------------------------------------------------------- Multi Wound Chart Details Patient Name: Date of Service: [name] 11/05/2019 7:30 AM Medical Record [id number] Patient Account Number: [id number] Date of Birth/Sex: Treating RN: 10-15-1986 (33 y.o. [name] Primary Care Brysun Eschmann: [name], [name] Other Clinician: Referring Corey Laski: Treating Priscillia Fouch/Extender:Robson, [name], [name]: 200 Vital Signs Height(in): 70 Pulse(bpm): 98 Weight(lbs): 216 Blood Pressure(mmHg): 131/68 Body Mass Index(BMI): 31 Temperature(F):  98 Respiratory 20 Rate(breaths/min): Photos: [17:No Photos] [24:No Photos] [35:No Photos] Wound Location: [17:Right Toe - Web between Left Foot - Dorsal 1st and 2nd - Dorsal] [35:Left Ischium] Wounding Event: [17:Gradually Appeared] [24:Gradually Appeared] [35:Gradually Appeared] Primary Etiology: [17:Atypical] [24:Inflammatory] [35:Pressure Ulcer] Comorbid History: [17:Sleep Apnea, Hypertension, Sleep Apnea, Hypertension, Sleep Apnea, Hypertension, Paraplegia] [24:Paraplegia] [35:Paraplegia] Date Acquired: [17:04/07/2017] [24:03/08/2018] [35:04/28/2019] Weeks of Treatment: [id number] [24:86] [35:27] Wound Status: [17:Open] [24:Open] [35:Open] Clustered Wound: [17:Yes] [24:No] [35:Yes] Clustered Quantity: [17:2] [24:N/A] [35:2] Measurements L x W x D 4.5x2x0.1 [24:3x2.5x0.3] [35:3x0.5x0.1] (cm) Area (cm) : [id number] [24:5.89] [35:1.178] Volume (cm) : [id number] [24:1.767] [35:0.118] %  Reduction in Area: [17:-1100.20%] [24:-2395.80%] [35:91.70%] % Reduction in Volume: -1098.30% [24:-7262.50%] [35:91.70%] Classification: [17:Full Thickness Without Exposed Support Structures Exposed Support Structures] [24:Full Thickness Without] [35:Category/Stage III] Exudate Amount: [17:Medium] [24:Medium] [35:Medium] Exudate Type: [17:Serosanguineous] [24:Purulent] [35:Serosanguineous] Exudate Color: [17:red, brown] [24:yellow, brown, green] [35:red, brown] Wound Margin: [17:Distinct, outline attached Distinct, outline attached Flat and Intact] Granulation Amount: [17:Large (67-100%)] [24:None Present (0%)] [35:Large (67-100%)] Granulation Quality: [17:Pale] [24:N/A] [35:Red, Friable] Necrotic Amount: [17:Small (1-33%)] [24:Large (67-100%)] [35:None Present (0%)] Exposed Structures: [17:Fat Layer (Subcutaneous Fat Layer (Subcutaneous Fat Layer (Subcutaneous Tissue) Exposed: Yes Fascia: No Tendon: No Muscle: No Joint: No Bone: No] [24:Tissue) Exposed: Yes Fascia: No Tendon: No Muscle: No Joint: No Bone: No]  [35:Tissue) Exposed: Yes  Fascia: No Tendon: No Muscle: No Joint: No Bone: No] Epithelialization: [17:Medium (34-66%)] [24:Small (1-33%)] [35:Medium (34-66%)] Debridement: [17:N/A] [24:Debridement - Excisional N/A] Pre-procedure [17:N/A] [24:08:23] [35:N/A] Verification/Time Out Taken: Tissue Debrided: [17:N/A] [24:Subcutaneous, Slough] [35:N/A] Level: [17:N/A] [24:Skin/Subcutaneous Tissue N/A] Debridement Area (sq cm):N/A [24:7.5] [35:N/A] Instrument: [17:N/A] [24:Curette] [35:N/A] Bleeding: [17:N/A] [24:Minimum] [35:N/A] Hemostasis Achieved: [17:N/A] [24:Pressure] [35:N/A] Procedural Pain: [17:N/A] [24:0] [35:N/A] Post Procedural Pain: [17:N/A] [24:0] [35:N/A] Debridement Treatment N/A [24:Procedure was tolerated] [35:N/A] Response: [24:well] Post Debridement [17:N/A] [24:3x2.5x0.3] [35:N/A] Measurements L x W x D (cm) Post Debridement [17:N/A] [24:1.767] [35:N/A] Volume: (cm) Procedures Performed: [17:N/A] [24:Debridement] [35:N/A] Treatment Notes Wound #17 (Right, Dorsal Toe - Web between 1st and 2nd) 1. Cleanse With Wound Cleanser 2. Periwound Care Antifungal cream Moisturizing lotion 3. Primary Dressing Applied Calcium Alginate Ag 4. Secondary Dressing Dry Gauze Roll Gauze 5. Secured With Tape 6. Support Layer Applied Compression garment Wound #24 (Left, Dorsal Foot) 1. Cleanse With Wound Cleanser 2. Periwound Care Antifungal cream Moisturizing lotion 3. Primary Dressing Applied Other primary dressing (specifiy in notes) 4. Secondary Dressing Dry Gauze Roll Gauze 5. Secured With Tape 6. Support Layer Applied Compression garment Notes patient declined heel cup to left heel. patient requested lotion instead of TCA cream to feet and legs. MD made aware. primary dressing cutimed sorbact swab. Wound #35 (Left Ischium) 1. Cleanse With Wound Cleanser 2. Periwound Care Barrier cream 3. Primary Dressing Applied Calcium Alginate Ag 4. Secondary  Dressing Foam Border Dressing 5. Secured With Office manager) Signed: 11/05/2019 5:56:50 PM By: Linton Ham MD Signed: 11/05/2019 6:02:21 PM By: Levan Hurst RN, BSN Entered By: Linton Ham on 11/05/2019 09:10:27 -------------------------------------------------------------------------------- Multi-Disciplinary Care Plan Details Patient Name: Date of Service: CRIBBRiaz, Shostak 11/05/2019 7:30 AM Medical Record [id number] Patient Account Number: [id number] Date of Birth/Sex: Treating RN: Oct 15, 1986 (34 y.o. [name] Primary Care Jersie Beel: [name], [name] Other Clinician: Referring Ayame Rena: Treating Merlean Pizzini/Extender:Robson, [name], [name]: 200 Active Inactive Wound/Skin Impairment Nursing Diagnoses: Impaired tissue integrity Knowledge deficit related to ulceration/compromised skin integrity Goals: Patient/caregiver will verbalize understanding of skin care regimen Date Initiated: 01/05/2016 Target Resolution Date: 11/23/2019 Goal Status: Active Ulcer/skin breakdown will have a volume reduction of 30% by week 4 Date Initiated: 01/05/2016 Date Inactivated: 12/22/2017 Target Resolution Date: 01/19/2018 Unmet Reason: complex Goal Status: Unmet wounds, infection Interventions: Assess patient/caregiver ability to obtain necessary supplies Assess ulceration(s) every visit Provide education on ulcer and skin care Notes: Electronic Signature(s) Signed: 11/05/2019 6:02:21 PM By: Levan Hurst RN, BSN Entered By: Levan Hurst on 11/05/2019 07:57:14 -------------------------------------------------------------------------------- Pain Assessment Details Patient Name: Date of Service: [place], Grace [name]. 11/05/2019 7:30 AM Medical Record [id number] Patient Account Number: [id number] Date of Birth/Sex: Treating RN: 14-Dec-1985 (33 y.o. [name] Primary Care Phylis Javed: Other  Clinician: Janine Limbo Referring Marvie Brevik: Treating Shakeera Rightmyer/Extender:Robson, [name], [name]: 200 Active Problems Location of Pain Severity and Description of Pain Patient Has Paino No Site Locations Pain Management and Medication Current Pain Management: Medication: No Cold Application: No Rest: No Massage: No Activity: No T.E.N.S.: No Heat Application: No Leg drop or elevation: No Is the Current Pain Management Adequate: Adequate How does your wound impact your activities of daily livingo Sleep: No Bathing: No Appetite: No Relationship With Others: No Bladder Continence: No Emotions: No Bowel Continence: No Work: No Toileting: No Drive: No Dressing: No Hobbies: No Electronic Signature(s) Signed: 11/05/2019 5:58:58 PM By: Deon Pilling Entered By: Deon Pilling on 11/05/2019 07:53:16 -------------------------------------------------------------------------------- Patient/Caregiver Education Details Patient Name: Date of Service: [name] 12/14/2020andnbsp7:30 AM Medical Record [id number] Patient Account Number: [id number] Date of Birth/Gender: [birth year] (33 y.o. M) Treating RN: Levan Hurst Primary Care Physician: Janine Limbo Other Clinician: Referring Physician: Treating Physician/Extender:Robson, [name], [name]: 200 Education Assessment Education Provided To: Patient Education Topics Provided Wound/Skin Impairment: Methods: Explain/Verbal Responses: State content correctly Electronic Signature(s) Signed: 11/05/2019 6:02:21 PM By: Levan Hurst RN, BSN Entered By: Levan Hurst on 11/05/2019 11:03:25 -------------------------------------------------------------------------------- Wound Assessment Details Patient Name: Date of Service: Kiddy, Grace [name]. 11/05/2019 7:30 AM Medical Record [id number] Patient Account Number: [id number] Date of Birth/Sex: Treating RN: 09-09-1986 (33 y.o. [name] Primary Care Verdean Murin: [place], [place] Other Clinician: Referring Camilia Caywood: Treating Lucus Lambertson/Extender:Robson, [name], [name]: 200 Wound Status Wound Number: 17 Primary Etiology: Atypical Wound Location: Right Toe - Web between 1st and Wound Status: Open 2nd - Dorsal Comorbid Sleep Apnea, Hypertension, Wounding Event: Gradually Appeared History: Paraplegia Date Acquired: 04/07/2017 Weeks Of Treatment: 134 Clustered Wound: Yes Photos Wound Measurements Length: (cm) 4.5 % Reductio Width: (cm) 2 % Reductio Depth: (cm) 0.1 Epithelial Clustered Quantity: 2 Tunneling: Area: (cm) 7.069 Undermini Volume: (cm) 0.707 Wound Description Full Thickness Without Exposed Support Foul Od Classification: Structures Slough/ Wound Distinct, outline attached Margin: Exudate Medium Amount: Exudate Serosanguineous Type: Exudate red, brown Color: Wound Bed Granulation Amount: Large (67-100%) Granulation Quality: Pale Fascia Necrotic Amount: Small (1-33%) Fat Lay Necrotic Quality: Adherent Slough Tendon Muscle Joint E Bone Ex or After Cleansing: No Fibrino Yes Exposed Structure Exposed: No er (Subcutaneous Tissue) Exposed: Yes Exposed: No Exposed: No xposed: No posed: No n in Area: -1100.2% n in Volume: -1098.3% ization: Medium (34-66%) No ng: No Treatment Notes Wound #17 (Right, Dorsal Toe - Web between 1st and 2nd) 1. Cleanse With Wound Cleanser 2. Periwound Care Antifungal cream Moisturizing lotion 3. Primary Dressing Applied Calcium Alginate Ag 4. Secondary Dressing Dry Gauze Roll Gauze 5. Secured With Tape 6. Support Layer Camera operator) Signed: 11/07/2019 3:47:19 PM By: Mikeal Hawthorne EMT/HBOT Signed: 11/07/2019 5:21:15 PM By: Deon Pilling Previous Signature: 11/05/2019 5:58:58 PM Version By: Deon Pilling Entered By: Mikeal Hawthorne on 11/07/2019  14:37:25 -------------------------------------------------------------------------------- Wound Assessment Details Patient Name: Date of Service: Erekson, Lexander E. 11/05/2019 7:30 AM Medical Record [id number] Patient Account Number: [id number] Date of Birth/Sex: Treating RN: 1986-05-23 (33 y.o. [name] Primary Care Cashel Bellina: [place], [place] Other Clinician: Referring Julus Kelley: Treating Yurem Viner/Extender:Robson, [name], [name]: 200 Wound Status Wound Number: 24 Primary Etiology: Inflammatory Wound Location: Left Foot - Dorsal Wound Status: Open Wounding Event: Gradually Appeared Comorbid Sleep Apnea, Hypertension, History: Paraplegia Date Acquired: 03/08/2018 Weeks Of Treatment: 86 Clustered Wound: No Photos Wound Measurements Length: (cm) 3 % Reduct Width: (cm) 2.5 %  Reduct Depth: (cm) 0.3 Epitheli Area: (cm) 5.89 Tunneli Volume: (cm) 1.767 Undermi Wound Description Classification: Full Thickness Without Exposed Support Foul Odo Structures Slough/F Wound Distinct, outline attached Margin: Exudate Medium Amount: Exudate Purulent Type: Exudate yellow, brown, green Color: Wound Bed Granulation Amount: None Present (0%) Necrotic Amount: Large (67-100%) Fascia E Necrotic Quality: Adherent Slough Fat Laye Tendon E Muscle E Joint Ex Bone Exp r After Cleansing: No ibrino Yes Exposed Structure xposed: No r (Subcutaneous Tissue) Exposed: Yes xposed: No xposed: No posed: No osed: No ion in Area: -2395.8% ion in Volume: -7262.5% alization: Small (1-33%) ng: No ning: No Treatment Notes Wound #24 (Left, Dorsal Foot) 1. Cleanse With Wound Cleanser 2. Periwound Care Antifungal cream Moisturizing lotion 3. Primary Dressing Applied Other primary dressing (specifiy in notes) 4. Secondary Dressing Dry Gauze Roll Gauze 5. Secured With Tape 6. Support Layer Applied Compression garment Notes patient declined heel cup to  left heel. patient requested lotion instead of TCA cream to feet and legs. MD made aware. primary dressing cutimed sorbact swab. Electronic Signature(s) Signed: 11/07/2019 3:47:19 PM By: Mikeal Hawthorne EMT/HBOT Signed: 11/07/2019 5:21:15 PM By: Deon Pilling Previous Signature: 11/05/2019 5:58:58 PM Version By: Deon Pilling Entered By: Mikeal Hawthorne on 11/07/2019 14:31:04 -------------------------------------------------------------------------------- Wound Assessment Details Patient Name: Date of Service: Weisenburger, Derel E. 11/05/2019 7:30 AM Medical Record [id number] Patient Account Number: [id number] Date of Birth/Sex: Treating RN: 11/24/1985 (33 y.o. [name] Primary Care Ivis Henneman: [place], [place] Other Clinician: Referring Gabbie Marzo: Treating Charmian Forbis/Extender:Robson, [name], [name]: 200 Wound Status Wound Number: 35 Primary Etiology: Pressure Ulcer Wound Location: Left Ischium Wound Status: Open Wounding Event: Gradually Appeared Comorbid Sleep Apnea, Hypertension, History: Paraplegia Date Acquired: 04/28/2019 Weeks Of Treatment: 27 Clustered Wound: Yes Photos Wound Measurements Length: (cm) 3 Width: (cm) 0.5 Depth: (cm) 0.1 Clustered Quantity: 2 Area: (cm) 1.178 Volume: (cm) 0.118 Wound Description Classification: Category/Stage III Wound Margin: Flat and Intact Exudate Amount: Medium Exudate Type: Serosanguineous Exudate Color: red, brown Wound Bed Granulation Amount: Large (67-100%) Granulation Quality: Red, Friable Necrotic Amount: None Present (0%) Foul Odor After Cleansing: No Slough/Fibrino Yes Exposed Structure Fascia Exposed: No Fat Layer (Subcutaneous Tissue) Exposed: Yes Tendon Exposed: No Muscle Exposed: No Joint Exposed: No Bone Exposed: No % Reduction in Area: 91.7% % Reduction in Volume: 91.7% Epithelialization: Medium (34-66%) Tunneling: No Undermining: No Treatment Notes Wound #35 (Left  Ischium) 1. Cleanse With Wound Cleanser 2. Periwound Care Barrier cream 3. Primary Dressing Applied Calcium Alginate Ag 4. Secondary Dressing Foam Border Dressing 5. Secured With Office manager) Signed: 11/07/2019 3:47:19 PM By: Mikeal Hawthorne EMT/HBOT Signed: 11/07/2019 5:21:15 PM By: Deon Pilling Previous Signature: 11/05/2019 5:58:58 PM Version By: Deon Pilling Entered By: Mikeal Hawthorne on 11/07/2019 14:23:13 -------------------------------------------------------------------------------- Vitals Details Patient Name: Date of Service: Daubenspeck, Burnis E. 11/05/2019 7:30 AM Medical Record [id number] Patient Account Number: [id number] Date of Birth/Sex: Treating RN: 01-Oct-1986 (33 y.o. [name] Primary Care Tywana Robotham: [place], [place] Other Clinician: Referring Cambria Osten: Treating Fate Galanti/Extender:Robson, [name], [name]: 200 Vital Signs Time Taken: 07:52 Temperature (F): 98 Height (in): 70 Pulse (bpm): 98 Weight (lbs): 216 Respiratory Rate (breaths/min): 20 Body Mass Index (BMI): 31 Blood Pressure (mmHg): 131/68 Reference Range: 80 - 120 mg / dl Electronic Signature(s) Signed: 11/05/2019 5:58:58 PM By: Deon Pilling Entered By: Deon Pilling on 11/05/2019 07:54:40

## 2019-11-19 ENCOUNTER — Encounter (HOSPITAL_BASED_OUTPATIENT_CLINIC_OR_DEPARTMENT_OTHER): Payer: BC Managed Care – PPO | Admitting: Internal Medicine

## 2019-11-19 ENCOUNTER — Other Ambulatory Visit: Payer: Self-pay

## 2019-11-19 DIAGNOSIS — L98419 Non-pressure chronic ulcer of buttock with unspecified severity: Secondary | ICD-10-CM | POA: Diagnosis not present

## 2019-11-19 DIAGNOSIS — L539 Erythematous condition, unspecified: Secondary | ICD-10-CM | POA: Diagnosis not present

## 2019-11-19 DIAGNOSIS — L97521 Non-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: Secondary | ICD-10-CM | POA: Diagnosis not present

## 2019-11-19 DIAGNOSIS — I10 Essential (primary) hypertension: Secondary | ICD-10-CM | POA: Diagnosis not present

## 2019-11-19 DIAGNOSIS — L89329 Pressure ulcer of left buttock, unspecified stage: Secondary | ICD-10-CM | POA: Diagnosis not present

## 2019-11-19 DIAGNOSIS — G8220 Paraplegia, unspecified: Secondary | ICD-10-CM | POA: Diagnosis not present

## 2019-11-19 DIAGNOSIS — L97512 Non-pressure chronic ulcer of other part of right foot with fat layer exposed: Secondary | ICD-10-CM | POA: Diagnosis not present

## 2019-11-19 DIAGNOSIS — G4730 Sleep apnea, unspecified: Secondary | ICD-10-CM | POA: Diagnosis not present

## 2019-11-19 DIAGNOSIS — L97511 Non-pressure chronic ulcer of other part of right 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: Secondary | ICD-10-CM | POA: Diagnosis not present

## 2019-11-19 DIAGNOSIS — L97522 Non-pressure chronic ulcer of other part of left foot with fat layer exposed: Secondary | ICD-10-CM | POA: Diagnosis not present

## 2019-11-19 NOTE — Progress Notes (Signed)
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 has areas in his first and second toes bilaterally in the anterior and posterior web space. I had been using Iodoflex here after a prolonged course of silver alginate with ketoconazole was ineffective [question tinea pedis] 07/14/17; patient arrives today with a very difficult adherent material over his left lateral lower leg wound. He also has surrounding erythema and poorly controlled edema. He was switched his Santyl last visit which the nurses are applying once during his doctor visit and once on a nurse visit. He was also reduced to 2 layer compression I'm not exactly sure of the issue here. 07/21/17; better surface today after 1 week of Iodoflex. Significant cellulitis that we treated last week also better. [Doxycycline] 07/28/17 better surface today with now 2 weeks of Iodoflex. Significant cellulitis treated with doxycycline. He has now completed the doxycycline and he is back to his usual degree of chronic venous inflammation/stasis dermatitis. He reminds me he has had ablations surgery here 08/04/17; continued  improvement with Iodoflex to the left lateral leg wound in terms of the surface of the wound although the dimensions are better. He is  not currently on any antibiotics, he has the usual degree of chronic venous inflammation/stasis dermatitis. Problematic areas on the plantar aspect of the first second toe web space on the left and the dorsal aspect of the first second toe web space on the right. At one point I felt these were probably related to chronic fungal infections in treated him aggressively for this although we have not made any improvement here. 08/11/17; left lateral leg. Surface continues to improve with the Iodoflex although we are not seeing much improvement in overall wound dimensions. Areas on his plantar left foot and right foot show no improvement. In fact the right foot looks somewhat worse 08/18/17; left lateral leg. We changed to [facility] Blue last week after a prolonged course of Iodoflex which helps get the surface better. It appears that the wound with is improved. Continue with difficult areas on the left dorsal first second and plantar first second on the right 09/01/17; patient arrives in clinic today having had a temperature of 103 yesterday. He was seen in the ER and [facility]. The patient was concerned he could have cellulitis again in the right leg however they diagnosed him with a UTI and he is now on Keflex. He has a history of cellulitis which is been recurrent and difficult but this is been in the left leg, in the past 5 use doxycycline. He does in and out catheterizations at home which are risk factors for UTI 09/08/17; patient will be completing his Keflex this weekend. The erythema on the left leg is considerably better. He has a new wound today on the medial part of the right leg small superficial almost looks like a skin tear. He has worsening of the area on the right dorsal first and second toe. His major area on the left lateral leg is better. Using Hydrofera Blue on all areas 09/15/17; gradual reduction in width on the long wound in the left lateral leg. No debridement required. He also  has wounds on the plantar aspect of his left first second toe web space and on the dorsal aspect of the right first second toe web space. 09/22/17; there continues to be very gradual improvements in the dimensions of the left lateral leg wound. He hasn't round erythematous spot with might be pressure on his wheelchair. There is no evidence obviously of infection no purulence no warmth He has a dry scaled area on the plantar aspect of the left first second toe Improved area on the dorsal right first second toe. 09/29/17; left lateral leg wound continues to improve in dimensions mostly with an is still a fairly long but increasingly narrow wound. He has a dry scaled area on the plantar aspect of his left first second toe web space Increasingly concerning area on the dorsal right first second toe. In fact I am concerned today about possible cellulitis around this wound. The areas extending up his second toe and although there is deformities here almost appears to abut on the nailbed. 10/06/17; left lateral leg wound continues to make very gradual progress. Tissue culture I did from the right first second toe dorsal foot last time grew MRSA and enterococcus which was vancomycin sensitive. This was not sensitive to clindamycin or doxycycline. He is allergic to Zyvox and sulfa we have therefore arrange for him to have dalvance infusion tomorrow. He is had  this in the past and tolerated it well 10/20/17; left lateral leg wound continues to make decent progress. This is certainly reduced in terms of with there is advancing epithelialization.The cellulitis in the right foot looks better although he still has a deep wound in the dorsal aspect of the first second toe web space. Plantar left first toe web space on the left I think is making some progress 10/27/17; left lateral leg wound continues to make decent progress. Advancing epithelialization.using Hydrofera Blue The right first second toe web space  wound is better-looking using silver alginate Improvement in the left plantar first second toe web space. Again using silver alginate 11/03/17 left lateral leg wound continues to make decent progress albeit slowly. Using [facility] The right per second toe web space continues to be a very problematic looking punched out wound. I obtained a piece of tissue for deep culture I did extensively treated this for fungus. It is difficult to imagine that this is a pressure area as the patient states other than going outside he doesn't really wear shoes at home The left plantar first second toe web space looked fairly senescent. Necrotic edges. This required debridement change to [facility] Blue to all wound areas 11/10/17; left lateral leg wound continues to contract. Using Hydrofera Blue On the right dorsal first second toe web space dorsally. Culture I did of this area last week grew MRSA there is not an easy oral option in this patient was multiple antibiotic allergies or intolerances. This was only a rare culture isolate I'm therefore going to use Bactroban under silver alginate On the left plantar first second toe web space. Debridement is required here. This is also unchanged 11/17/17; left lateral leg wound continues to contract using Hydrofera Blue this is no longer the major issue. The major concern here is the right first second toe web space. He now has an open area going from dorsally to the plantar aspect. There is now wound on the inner lateral part of the first toe. Not a very viable surface on this. There is erythema spreading medially into the forefoot. No major change in the left first second toe plantar wound 11/24/17; left lateral leg wound continues to contract using Hydrofera Blue. Nice improvement today The right first second toe web space all of this looks a lot less angry than last week. I have given him clindamycin and topical Bactroban for MRSA and terbinafine for the  possibility of underlining tinea pedis that I could not control with ketoconazole. Looks somewhat better The area on the plantar left first second toe web space is weeping with dried debris around the wound 12/01/17; left lateral leg wound continues to contract he Hydrofera Blue. It is becoming thinner in terms of with nevertheless it is making good improvement. The right first second toe web space looks less angry but still a large necrotic-looking wounds starting on the plantar aspect of the right foot extending between the toes and now extensively on the base of the right second toe. I gave him clindamycin and topical Bactroban for MRSA anterior benefiting for the possibility of underlying tinea pedis. Not looking better today The area on the left first/second toe looks better. Debrided of necrotic debris 12/05/17* the patient was worked in urgently today because over the weekend he found blood on his incontinence bad when he woke up. He was found to have an ulcer by his wife who does most of his wound care. He came in today for [place] to  look at this. He has not had a history of wounds in his buttocks in spite of his paraplegia. 12/08/17; seen in follow-up today at his usual appointment. He was seen earlier this week and found to have a new wound on his buttock. We also follow him for wounds on the left lateral leg, left first second toe web space and right first second toe web space 12/15/17; we have been using Hydrofera Blue to the left lateral leg which has improved. The right first second toe web space has also improved. Left first second toe web space plantar aspect looks stable. The left buttock has worsened using Santyl. Apparently the buttock has drainage 12/22/17; we have been using Hydrofera Blue to the left lateral leg which continues to improve now 2 small wounds separated by normal skin. He tells [place] he had a fever up to 100 yesterday he is prone to UTIs but has not noted anything  different. He does in and out catheterizations. The area between the first and second toes today does not look good necrotic surface covered with what looks to be purulent drainage and erythema extending into the third toe. I had gotten this to something that I thought look better last time however it is not look good today. He also has a necrotic surface over the buttock wound which is expanded. I thought there might be infection under here so I removed a lot of the surface with a #5 curet though nothing look like it really needed culturing. He is been using Santyl to this area 12/27/17; his original wound on the left lateral leg continues to improve using Hydrofera Blue. I gave him samples of Baxdella although he was unable to take them out of fear for an allergic reaction ["lump in his throat"].the culture I did of the purulent drainage from his second toe last week showed both enterococcus and a set Enterobacter I was also concerned about the erythema on the bottom of his foot although paradoxically although this looks somewhat better today. 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 We've been using silver alginate here. They came up today that he sleeps in a recliner, I'm not sure why but I asked him to stop this 01/03/18; his original wound we've been using Hydrofera Blue is now separated into 2 areas. Ulcer on his left buttock is better he is off the recliner and sleeping in bed Finally both wound areas between his first and second toes also looks some better 01/10/18; his original wound on the left lateral leg is now separated into 2 wounds we've been using Hydrofera Blue Ulcer on his left buttock has some drainage. There is a small probing site going into muscle layer superiorly.using silver alginate -He arrives today with a deep tissue injury on the left heel The wound on the dorsal aspect of  his first second toe on the left looks a lot betterusing silver alginate ketoconazole The area on the first second toe web space on the right also looks a lot bette 01/17/18; his original wound on the left lateral leg continues to progress using Hydrofera Blue Ulcer on his left buttock also is smaller surface healthier except for a small probing site going into the muscle layer superiorly. 2.4 cm of tunneling in this area DTI on his left heel we have only been offloading. Looks better than last week no threatened open no evidence of infection the  wound on the dorsal aspect of the first second toe on the left continues to look like it's regressing we have only been using silver alginate and terbinafine orally The area in the first second toe web space on the right also looks to be a lot better using silver alginate and terbinafine I think this was prompted by tinea pedis 01/31/18; the patient was hospitalized in [place] last week apparently for a complicated UTI. He was discharged on cefepime he does in and out catheterizations. In the hospital he was discovered M I don't mild elevation of AST and ALTs and the terbinafine was stopped.predictably the pressure ulcer on his buttock looks betterusing silver alginate. The area on the left lateral leg also is better using Hydrofera Blue. The area between the first and second toes on the left better. First and second toes on the right still substantial but better. Finally the DTI on the left heel has held together and looks like it's resolving 02/07/18-he is here in follow-up evaluation for multiple ulcerations. He has new injury to the lateral aspect of the last issue a pressure ulcer, he states this is from adhesive removal trauma. He states he has tried multiple adhesive products with no success. All other ulcers appear stable. The left heel DTI is resolving. We will continue with same treatment plan and follow-up next week. 02/14/18; follow-up for  multiple areas. He has a new area last week on the lateral aspect of his pressure ulcer more over the posterior trochanter. The original pressure ulcer looks quite stable has healthy granulation. 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 Almost fully epithelialized on the original superior area using Hydrofera Blue DTI on the left heel has peeled off this week to reveal a small superficial wound under denuded skin and subcutaneous tissue Both areas between the first and second toes look better including nothing open on the left 02/21/18; The patient's wounds on his left ischial tuberosity and posterior left greater trochanter actually looked better. He has a large area of irritation around the area which I think is contact dermatitis. I am doubtful that this is fungal His original wound on the left lateral calf continues to improve we have been using Hydrofera Blue There is no open area in the left first second toe web space although there is a lot of thick callus The DTI on the left heel required debridement today of necrotic surface eschar and subcutaneous tissue using silver alginate Finally the area on the right first second toe webspace continues to contract using silver alginate and ketoconazole 02/28/18 Left ischial tuberosity wounds look better using silver alginate. Original wound on the left calf only has one small open area left using Hydrofera Blue DTI on the left heel required debridement mostly removing skin from around this wound surface. Using silver alginate The areas on the right first/second toe web space using silver alginate and ketoconazole 03/08/18 on evaluation today patient appears to be doing decently well as best I can tell in regard to his wounds. This is the first time that I have seen him as he generally is followed by Dr. Dellia Nims. With that being said none of  his wounds appear to be infected he does have an area where there is some skin covering what appears to be a new wound on the left dorsal surface of his great toe. This is right at the nail bed. With  that being said I do believe that debrided away some of the excess skin can be of benefit in this regard. Otherwise he has been tolerating the dressing changes without complication. 03/14/18; patient arrives today with the multiplicity of wounds that we are following. He has not been systemically unwell Original wound on the left lateral calf now only has 2 small open areas we've been using Hydrofera Blue which should continue The deep tissue injury on the left heel requires debridement today. We've been using silver alginate The left first second toe and the right first second toe are both are reminiscence what I think was tinea pedis. Apparently some of the callus Surface between the toes was removed last week when it started draining. Purulent drainage coming from the wound on the ischial tuberosity on the left. 03/21/18-He is here in follow-up evaluation for multiple wounds. 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 He tolerated debridement. The only change to last week's recommendations is to discontinue antifungal cream between toes. He will follow-up next week 03/28/18; following up for multiple wounds;Concern this week is streaking redness and swelling in the right foot. He is going to need antibiotics for this. 03/31/18; follow-up for right foot cellulitis. Streaking redness and swelling in the right foot on 03/28/18. 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 I put him on clindamycin 300 mg every 6 and brought him in for a quick check. He has an open wound between his first and second toes on the right foot as a potential source. 04/04/18; Right foot cellulitis is resolving he is completing  clindamycin. This is truly good news Left lateral calf wound which is initial wound only has one small open area inferiorly this is close to healing out. He has compression stockings. We will use Hydrofera Blue right down to the epithelialization of this Nonviable surface on the left heel which was initially pressure with a DTI. We've been using Hydrofera Blue. I'm going to switch this back to silver alginate Left first second toe/tinea pedis this looks better using silver alginate Right first second toe tinea pedis using silver alginate Large pressure ulcers on theLeft ischial tuberosity. Small wound here Looks better. I am uncertain about the surface over the large wound. Using silver alginate 04/11/18; Cellulitis in the right foot is resolved Left lateral calf wound which was his original wounds still has 2 tiny open areas remaining this is just about closed Nonviable surface on the left heel is better but still requires debridement Left first second toe/tinea pedis still open using silver alginate Right first second toe wound tinea pedis I asked him to go back to using ketoconazole and silver alginate Large pressure ulcers on the left ischial tuberosity this shear injury here is resolved. Wound is smaller. No evidence of infection using silver alginate 04/18/18; Patient arrives with an intense area of cellulitis in the right mid lower calf extending into the right heel area. Bright red and warm. Smaller area on the left anterior leg. He has a significant history of MRSA. He will definitely need antibioticsdoxycycline He now has 2 open areas on the left ischial tuberosity the original large wound and now a satellite area which I think was above his initial satellite areas. Not a wonderful surface on this satellite area surrounding erythema which looks like pressure related. His left lateral calf wound again his original wound is just about closed Left heel pressure injury still requiring  debridement Left first second toe looks a lot better using silver alginate Right first second toe also using silver alginate and ketoconazole cream also looks better 04/20/18; the patient was worked in early today out of concerns with his cellulitis on the right leg. I had started him on doxycycline. This was 2 days ago. His wife was concerned about the swelling in the area. Also concerned about the left buttock. He has not been systemically unwell no fever chills. No nausea vomiting or diarrhea 04/25/18; the patient's left buttock wound is continued to deteriorate he is using Hydrofera Blue. He is still completing clindamycin for the cellulitis on the right leg although all of this looks better. 05/02/18 Left buttock wound still with a lot of drainage and a very tightly adherent fibrinous necrotic surface. He has a deeper area superiorly The left lateral calf wound is still closed DTI wound on the left heel necrotic surface especially the circumference using Iodoflex Areas between his left first second toe and right first second toe both look better. Dorsally and the right first second toe he had a necrotic surface although at smaller. In using silver alginate and ketoconazole. I did a culture last week which was a deep tissue culture of the reminiscence of the open wound on the right first second toe dorsally. This grew a few Acinetobacter and a few methicillin-resistant staph aureus. Nevertheless the area actually this week looked better. I didn't feel the need to specifically address this at least in terms of systemic antibiotics. 05/09/18; wounds are measuring larger more drainage per our intake. We are using Santyl covered with alginate on the large superficial buttock wounds, Iodosorb on the left heel, ketoconazole and silver alginate to the dorsal first and second toes bilaterally. 05/16/18; The area on his left buttock better in some aspects although the area superiorly over the ischial  tuberosity required an extensive debridement.using Santyl Left heel appears stable. Using Iodoflex The areas between his first and second toes are not bad however there is spreading erythema up the dorsal aspect of his left foot this looks like cellulitis again. He is insensate the erythema is really very brilliant.o Erysipelas He went to see an allergist days ago because he was itching part of this he had lab work done. This showed a white count of 15.1 with 70% neutrophils. Hemoglobin of 11.4 and a platelet count of 659,000. Last white count we had in Epic was a 2-1/2 years ago which was 25.9 but he was ill at the time. He was able to show me some lab work that was done by his primary physician the pattern is about the same. 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 But prompted me to go ahead and do x-rays of both feet and the pelvis rule out osteomyelitis. He also had a comprehensive metabolic panel this was reasonably normal his albumin was 3.7 liver function tests BUN/creatinine all normal 05/23/18; x-rays of both his feet from last week were negative for underlying pulmonary abnormality. The x-ray of his pelvis however showed mild irregularity in the left ischial which may represent some early osteomyelitis. The wound in the left ischial continues to get deeper clearly now exposed muscle. Each week necrotic surface material over this area. Whereas the rest of the wounds do not look so bad. The left ischial wound we have been using Santyl and calcium alginate To the left heel surface necrotic debris using Iodoflex The left lateral leg  is still healed Areas on the left dorsal foot and the right dorsal foot are about the same. There is some inflammation on the left which might represent contact dermatitis, fungal dermatitis I am doubtful cellulitis although this looks better than last week 05/30/18; CT scan done at Hospital did not show any  osteomyelitis or abscess. Suggested the possibility of underlying cellulitis although I don't see a lot of evidence of this at the bedside The wound itself on the left buttock/upper thigh actually looks somewhat better. No debridement Left heel also looks better no debridement continue Iodoflex Both dorsal first second toe spaces appear better using Lotrisone. Left still required debridement 06/06/18; Intake reported some purulent looking drainage from the left gluteal wound. Using Santyl and calcium alginate Left heel looks better although still a nonviable surface requiring debridement The left dorsal foot first/second webspace actually expanding and somewhat deeper. I may consider doing a shave biopsy of this area Right dorsal foot first/second webspace appears stable to improved. Using Lotrisone and silver alginate to both these areas 06/13/18 Left gluteal surface looks better. Now separated in the 2 wounds. No debridement required. Still drainage. We'll continue silver alginate Left heel continues to look better with Iodoflex continue this for at least another week Of his dorsal foot wounds the area on the left still has some depth although it looks better than last week. We've been using Lotrisone and silver alginate 06/20/18 Left gluteal continues to look better healthy tissue Left heel continues to look better healthy granulation wound is smaller. He is using Iodoflex and his long as this continues continue the Iodoflex Dorsal right foot looks better unfortunately dorsal left foot does not. There is swelling and erythema of his forefoot. He had minor trauma to this several days ago but doesn't think this was enough to have caused any tissue injury. Foot looks like cellulitis, we have had this problem before 06/27/18 on evaluation today patient appears to be doing a little worse in regard to his foot ulcer. 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 Both of which would really be his only options for treating this infection. In the past he has been given and effusion of Orbactiv. This is done very well for him in the past again it's one time dosing IV antibiotic therapy. Subsequently I do believe this is something we're gonna need to see about doing at this point in time. Currently his other wounds seem to be doing somewhat better in my pinion I'm pretty happy in that regard. 07/03/18 on evaluation today patient's wounds actually appear to be doing fairly well. He has been tolerating the dressing changes without complication. All in all he seems to be showing signs of improvement. In regard to the antibiotics he has been dealing with infectious disease since I saw him last week as far as getting this scheduled. In the end [facility]'s [facility] this coming Friday. In the meantime he has been continuing to perform the dressing changes in such as previous. There does not appear to be any evidence of infection worsengin at this time. 07/10/18; Since I last saw this man 2 weeks ago things have actually improved. IV antibiotics of resulted in less forefoot erythema although there is still some present. He is not systemically unwell Left buttock  wounds 2 now have no depth there is increased epithelialization Using silver alginate Left heel still requires debridement using Iodoflex Left dorsal foot still with a sizable wound about the size of a border but healthy granulation Right dorsal foot still with a slitlike area using silver alginate 07/18/18; the patient's cellulitis in the left foot is improved in fact I think it is on its way to resolving. Left buttock wounds 2 both look better although the larger one has hypertension granulation we've been using silver alginate Left heel has some thick  circumferential redundant skin over the wound edge which will need to be removed today we've been using Iodoflex Left dorsal foot is still a sizable wound required debridement using silver alginate The right dorsal foot is just about closed only a small open area remains here 07/25/18; left foot cellulitis is resolved Left buttock wounds 2 both look better. Hyper-granulation on the major area Left heel as some debris over the surface but otherwise looks a healthier wound. Using silver collagen Right dorsal foot is just about closed 07/31/18; arrives with our intake nurse worried about purulent drainage from the buttock. We had hyper-granulation here last week His buttock wounds 2 continue to look better Left heel some debris over the surface but measuring smaller. Right dorsal foot unfortunately has openings between the toes Left foot superficial wound looks less aggravated. 08/07/18 Buttock wounds continue to look better although some of her granulation and the larger medial wound. silver alginate Left heel continues to look a lot better.silver collagen Left foot superficial wound looks less stable. Requires debridement. He has a new wound superficial area on the foot on the lateral dorsal foot. Right foot looks better using silver alginate without Lotrisone 08/14/2018; patient was in the ER last week diagnosed with a UTI. He is now on Cefpodoxime and Macrodantin. Buttock wounds continued to be smaller. Using silver alginate Left heel continues to look better using silver collagen Left foot superficial wound looks as though it is improving Right dorsal foot area is just about healed. 08/21/2018; patient is completed his antibiotics for his UTI. He has 2 open areas on the buttocks. There is still not closed although the surface looks satisfactory. Using silver alginate Left heel continues to improve using silver collagen The bilateral dorsal foot areas which are at the base of his first and  second toes/possible tinea pedis are actually stable on the left but worse on the right. The area on the left required debridement of necrotic surface. After debridement I obtained a specimen for PCR culture. The right dorsal foot which is been just about healed last week is now reopened 08/28/2018; culture done on the left dorsal foot showed coag negative staph both staph epidermidis and Lugdunensis. I think this is worthwhile initiating systemic treatment. I will use doxycycline given his long list of allergies. The area on the left heel slightly improved but still requiring debridement. The large wound on the buttock is just about closed whereas the smaller one is larger. Using silver alginate in this area 09/04/2018; patient is completing his doxycycline for the left foot although this continues to be a very difficult wound area with very adherent necrotic debris. We are using silver alginate to all his wounds right foot left foot and the small wounds on his buttock, silver collagen on the left heel. 09/11/2018; once again this patient has intense erythema and swelling of the left forefoot. Lesser degrees of erythema in the right foot. He has a long list  of allergies and intolerances. I will reinstitute doxycycline. 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 Using silver alginate Left heel also looks better using silver collagen Unfortunately both the areas on his feet look worse. The area on the left first second webspace is now gone through to the plantar part of his foot. The area on the left foot anteriorly is irritated with erythema and swelling in the forefoot. 09/25/2018 His wound on the left plantar heel looks better. Using silver collagen The area on the left buttock 2 small remnant areas. One is closed one is still open. Using silver alginate The areas between both his first and second toes look worse. This in spite of long-standing  antifungal therapy with ketoconazole and silver alginate which should have antifungal activity He has small areas around his original wound on the left calf one is on the bottom of the original scar tissue and one superiorly both of these are small and superficial but again given wound history in this site this is worrisome 10/02/2018 Left plantar heel continues to gradually contract using silver collagen Left buttock wound is unchanged using silver alginate The areas on his dorsal feet between his first and second toes bilaterally look about the same. I prescribed clindamycin ointment to see if we can address chronic staph colonization and also the underlying possibility of erythrasma The left lateral lower extremity wound is actually on the lateral part of his ankle. Small open area here. We have been using silver alginate 10/09/2018; Left plantar heel continues to look healthy and contract. No debridement is required Left buttock slightly smaller with a tape injury wound just below which was new this week Dorsal feet somewhat improved I have been using clindamycin Left lateral looks lower extremity the actual open area looks worse although a lot of this is epithelialized. I am going to change to silver collagen today He has a lot more swelling in the right leg although this is not pitting not red and not particularly warm there is a lot of spasm in the right leg usually indicative of people with paralysis of some underlying discomfort. We have reviewed his vascular status from 2017 he had a left greater saphenous vein ablation. I wonder about referring him back to vascular surgery if the area on the left leg continues to deteriorate. 10/16/2018 in today for follow-up and management of multiple lower extremity ulcers. His left Buttock wound is much lower smaller and almost closed completely. The wound to the left ankle has began to reopen with Epithelialization and some adherent slough. He  has multiple new areas to the left foot and leg. The left dorsal foot without much improvement. Wound present between left great webspace and 2nd toe. Erythema and edema present right leg. Right LE ultrasound obtained on 10/10/18 was negative for DVT. 10/23/2018; Left buttock is closed over. Still dry macerated skin but there is no open wound. I suspect this is chronic pressure/moisture Left lateral calf is quite a bit worse than when I saw this last. There is clearly drainage here he has macerated skin into the left plantar heel. We will change the primary dressing to alginate Left dorsal foot has some improvement in overall wound area. Still using clindamycin and silver alginate Right dorsal foot about the same as the left using clindamycin and silver alginate The erythema in the right leg has resolved. He is DVT rule out was negative Left heel pressure area  required debridement although the wound is smaller and the surface is health 10/26/2018 The patient came back in for his nurse check today predominantly because of the drainage coming out of the left lateral leg with a recent reopening of his original wound on the left lateral calf. He comes in today with a large amount of surrounding erythema around the wound extending from the calf into the ankle and even in the area on the dorsal foot. He is not systemically unwell. He is not febrile. Nevertheless this looks like cellulitis. We have been using silver alginate to the area. I changed him to a regular visit and I am going to prescribe him doxycycline. The rationale here is a long list of medication intolerances and a history of MRSA. I did not see anything that I thought would provide a valuable culture 10/30/2018 Follow-up from his appointment 4 days ago with really an extensive area of cellulitis in the left calf left lateral ankle and left dorsal foot. I put him on doxycycline. He has a long list of medication allergies which are true  allergy reactions. 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 In any case he is a lot better today. The erythema especially in the anterior and lateral left calf is better. He still has left ankle erythema. He also is complaining about increasing edema in the right leg we have only been using Kerlix Coban and he has been doing the wraps at home. Finally he has a spotty rash on the medial part of his upper left calf which looks like folliculitis or perhaps wrap occlusion type injury. Small superficial macules not pustules 11/06/18 patient arrives today with again a considerable degree of erythema around the wound on the left lateral calf extending into the dorsal ankle and dorsal foot. This is a lot worse than when I saw this last week. He is on doxycycline really with not a lot of improvement. He has not been systemically unwell Wounds on the; left heel actually looks improved. Original area on the left foot and proximity to the first and second toes looks about the same. He has superficial areas on the dorsal foot, anterior calf and then the reopening of his original wound on the left lateral calf which looks about the same The only area he has on the right is the dorsal webspace first and second which is smaller. He has a large area of dry erythematous skin on the left buttock small open area here. 11/13/2018; the patient arrives in much better condition. The erythema around the wound on the left lateral calf is a lot better. Not sure whether this was the clindamycin or the TCA and ketoconazole or just in the improvement in edema control [stasis dermatitis]. In any case this is a lot better. The area on the left heel is very small and just about resolved using silver collagen we have been using silver alginate to the areas on his dorsal feet 11/20/2018; his wounds include the left lateral calf, left heel, dorsal aspects of  both feet just proximal to the first second webspace. He is stable to slightly improved. I did not think any changes to his dressings were going to be necessary 11/27/2018 he has a reopening on the left buttock which is surrounded by what looks like tinea or perhaps some other form of dermatitis. The area on the left dorsal foot has some erythema around it I have marked this  area but I am not sure whether this is cellulitis or not. Left heel is not closed. Left calf the reopening is really slightly longer and probably worse 1/13; in general things look better and smaller except for the left dorsal foot. Area on the left heel is just about closed, left buttock looks better only a small wound remains in the skin looks better [using Lotrisone] 1/20; the area on the left heel only has a few remaining open areas here. Left lateral calf about the same in terms of size, left dorsal foot slightly larger right lateral foot still not closed. The area on the left buttock has no open wound and the surrounding skin looks a lot better 1/27; the area on the left heel is closed. Left lateral calf better but still requiring extensive debridements. The area on his left buttock is closed. He still has the open areas on the left dorsal foot which is slightly smaller in the right foot which is slightly expanded. We have been using Iodoflex on these areas as well 2/3; left heel is closed. Left lateral calf still requiring debridement using Iodoflex there is no open area on his left buttock however he has dry scaly skin over a large area of this. Not really responding well to the Lotrisone. Finally the areas on his dorsal feet at the level of the first second webspace are slightly smaller on the right and about the same on the left. Both of these vigorously debrided with Anasept and gauze 2/10; left heel remains closed he has dry erythematous skin over the left buttock but there is no open wound here. Left lateral leg  has come in and with. Still requiring debridement we have been using Iodoflex here. Finally the area on the left dorsal foot and right dorsal foot are really about the same extremely dry callused fissured areas. He does not yet have a dermatology appointment 2/17; left heel remains closed. He has a new open area on the left buttock. The area on the left lateral calf is bigger longer and still covered in necrotic debris. No major change in his foot areas bilaterally. I am awaiting for a dermatologist to look on this. We have been using ketoconazole I do not know that this is been doing any good at all. 2/24; left heel remains closed. The left buttock wound that was new reopening last week looks better. The left lateral calf appears better also although still requires debridement. The major area on his foot is the left first second also requiring debridement. We have been putting Prisma on all wounds. I do not believe that the ketoconazole has done too much good for his feet. He will use Lotrisone I am going to give him a 2-week course of terbinafine. We still do not have a dermatology appointment 3/2 left heel remains closed however there is skin over bone in this area I pointed this out to him today. The left buttock wound is epithelialized but still does not look completely stable. The area on the left leg required debridement were using silver collagen here. With regards to his feet we changed to Lotrisone last week and silver alginate. 3/9; left heel remains closed. Left buttock remains closed. The area on the right foot is essentially closed. The left foot remains unchanged. Slightly smaller on the left lateral calf. Using silver collagen to both of these areas 3/16-Left heel remains closed. Area on right foot is closed. Left lateral calf above the lateral malleolus open wound  requiring debridement with easy bleeding. Left dorsal wound proximal to first toe also debrided. Left ischial  area open new. Patient has been using Prisma with wrapping every 3 days. Dermatology appointment is apparently tomorrow.Patient has completed his terbinafine 2-week course with some apparent improvement according to him, there is still flaking and dry skin in his foot on the left 3/23; area on the right foot is reopened. The area on the left anterior foot is about the same still a very necrotic adherent surface. He still has the area on the left leg and reopening is on the left buttock. He apparently saw dermatology although I do not have a note. According to the patient who is usually fairly well informed they did not have any good ideas. Put him on oral terbinafine which she is been on before. 3/30; using silver collagen to all wounds. Apparently his dermatologist put him on doxycycline and rifampin presumably some culture grew staph. I do not have this result. He remains on terbinafine although I have used terbinafine on him before 4/6; patient has had a fairly substantial reopening on the right foot between the first and second toes. He is finished his terbinafine and I believe is on doxycycline and rifampin still as prescribed by dermatology. We have been using silver collagen to all his wounds although the patient reports that he thinks silver alginate does better on the wounds on his buttock. 4/13; the area on his left lateral calf about the same size but it did not require debridement. Left dorsal foot just proximal to the webspace between the first and second toes is about the same. Still nonviable surface. I note some superficial bronze discoloration of the dorsal part of his foot Right dorsal foot just proximal to the first and second toes also looks about the same. I still think there may be the same discoloration I noted above on the left Left buttock wound looks about the same 4/20; left lateral calf appears to be gradually contracting using silver collagen. He remains on  erythromycin empiric treatment for possible erythrasma involving his digital spaces. The left dorsal foot wound is debrided of tightly adherent necrotic debris and really cleans up quite nicely. The right area is worse with expansion. I did not debride this it is now over the base of the second toe The area on his left buttock is smaller no debridement is required using silver collagen 5/4; left calf continues to make good progress. He arrives with erythema around the wounds on his dorsal foot which even extends to the plantar aspect. Very concerning for coexistent infection. He is finished the erythromycin I gave him for possible erythrasma this does not seem to have helped. The area on the left foot is about the same base of the dorsal toes Is area on the buttock looks improved on the left 5/11; left calf and left buttock continued to make good progress. Left foot is about the same to slightly improved. Major problem is on the right foot. He has not had an x-ray. Deep tissue culture I did last week showed both Enterobacter and E. coli. I did not change the doxycycline I put him on empirically although neither 1 of these were plated to doxycycline. He arrives today with the erythema looking worse on both the dorsal and plantar foot. Macerated skin on the bottom of the foot. he has not been systemically unwell 5/18-Patient returns at 1 week, left calf wound appears to be making some progress, left buttock wound  appears slightly worse than last time, left foot wound looks slightly better, right foot redness is marginally better. X-ray of both feet show no air or evidence of osteomyelitis. Patient is finished his Omnicef and terbinafine. He continues to have macerated skin on the bottom of the left foot as well as right 5/26; left calf wound is better, left buttock wound appears to have multiple small superficial open areas with surrounding macerated skin. X-rays that I did last time showed no  evidence of osteomyelitis in either foot. He is finished cefdinir and doxycycline. I do not think that he was on terbinafine. He continues to have a large superficial open area on the right foot anterior dorsal and slightly between the first and second toes. I did send him to dermatology 2 months ago or so wondering about whether they would do a fungal scraping. I do not believe they did but did do a culture. We have been using silver alginate to the toe areas, he has been using antifungals at home topically either ketoconazole or Lotrisone. We are using silver collagen on the left foot, silver alginate on the right, silver collagen on the left lateral leg and silver alginate on the left buttock 6/1; left buttock area is healed. We have the left dorsal foot, left lateral leg and right dorsal foot. We are using silver alginate to the areas on both feet and silver collagen to the area on his left lateral calf 6/8; the left buttock apparently reopened late last week. He is not really sure how this happened. He is tolerating the terbinafine. Using silver alginate to all wounds 6/15; left buttock wound is larger than last week but still superficial. Came in the clinic today with a report of purulence from the left lateral leg I did not identify any infection Both areas on his dorsal feet appear to be better. He is tolerating the terbinafine. Using silver alginate to all wounds 6/22; left buttock is about the same this week, left calf quite a bit better. His left foot is about the same however he comes in with erythema and warmth in the right forefoot once again. Culture that I gave him in the beginning of May showed Enterobacter and E. coli. I gave him doxycycline and things seem to improve although neither 1 of these organisms was specifically plated. 6/29; left buttock is larger and dry this week. Left lateral calf looks to me to be improved. Left dorsal foot also somewhat improved right foot  completely unchanged. The erythema on the right foot is still present. He is completing the Ceftin dinner that I gave him empirically [see discussion above.) 7/6 - All wounds look to be stable and perhaps improved, the left buttock wound is slightly smaller, per patient bleeds easily, completed ceftin, the right foot redness is less, he is on terbinafine 7/13; left buttock wound about the same perhaps slightly narrower. Area on the left lateral leg continues to narrow. Left dorsal foot slightly smaller right foot about the same. We are using silver alginate on the right foot and Hydrofera Blue to the areas on the left. Unna boot on the left 2 layer compression on the right 7/20; left buttock wound absolutely the same. Area on lateral leg continues to get better. Left dorsal foot require debridement as did the right no major change in the 7/27; left buttock wound the same size necrotic debris over the surface. The area on the lateral leg is closed once again. His left foot looks better  right foot about the same although there is some involvement now of the posterior first second toe area. He is still on terbinafine which I have given him for a month, not certain a centimeter major change 06/25/19-All wounds appear to be slightly improved according to report, left buttock wound looks clean, both foot wounds have minimal to no debris the right dorsal foot has minimal slough. We are using Hydrofera Blue to the left and silver alginate to the right foot and ischial wound. 8/10-Wounds all appear to be around the same, the right forefoot distal part has some redness which was not there before, however the wound looks clean and small. Ischial wound looks about the same with no changes 8/17; his wound on the left lateral calf which was his original chronic venous insufficiency wound remains closed. Since I last saw him the areas on the left dorsal foot right dorsal foot generally appear better but  require debridement. The area on his left initial tuberosity appears somewhat larger to me perhaps hyper granulated and bleeds very easily. We have been using Hydrofera Blue to the left dorsal foot and silver alginate to everything else 8/24; left lateral calf remains closed. The areas on his dorsal feet on the webspace of the first and second toes bilaterally both look better. The area on the left buttock which is the pressure ulcer stage II slightly smaller. I change the dressing to Hydrofera Blue to all areas 8/31; left lateral calf remains closed. The area on his dorsal feet bilaterally look better.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 the patient's extensive area on  the left lateral calf and ankle perhaps somewhat better. Using Santyl. The area on the left toes is healed and I think the area on his right dorsal foot is healed as well. There  is no cellulitis or venous inflammation involving the right leg. He is going to need compression stockings here. 10/07/16; the patient's extensive wound on the left lateral calf and ankle does not measure any differently however there appears to be less adherent surface slough using Santyl and aggressive weekly debridements 10/21/16; no major change in the area on the left lateral calf. Still the same measurement still very difficult to debridement adherent slough and nonviable subcutaneous tissue. This is not really been helped by several weeks of Santyl. Previously for 2 weeks I used Iodoflex for a short period. A prolonged course of Hydrofera Blue didn't really help. I'm not sure why I only used 2 weeks of Iodoflex on this there is no evidence of surrounding infection. He has a small area on the right second toe which looks as though it's progressing towards closure 10/28/16; the wounds on his toes appear to be closed. No major change in the left lateral leg wound although the surface looks somewhat better using Iodoflex. He has had previous arterial studies that were normal. He has had reflux studies and is status post ablation although I don't have any exact notes on which vein was ablated. I'll need to check the surgical record 11/04/16; he's had a reopening between the first and second toe on the left and right. No major change in the left lateral leg wound. There is what appears to be cellulitis of the left dorsal foot 11/18/16 the patient was hospitalized initially in [place] and then subsequently transferred to [facility] and was admitted there from 11/09/16 through 11/12/16. He had developed progressive cellulitis on the right leg in spite of the doxycycline I gave him. I'd spoken to the hospitalist in [place] who was concerned about continuing leukocytosis. CT scan is what I suggested this was done which showed soft tissue swelling without evidence of osteomyelitis or an underlying  abscess blood cultures were negative. At [facility] he was treated with vancomycin and Primaxin and then add an infectious disease consult. He was transitioned to Ceftaroline. He has been making progressive improvement. Overall a severe cellulitis of the right leg. He is been using silver alginate to her original wound on the left leg. The wounds in his toes on the right are closed there is a small open area on the base of the left second toe 11/26/15; the patient's right leg is much better although there is still some edema here this could be reminiscent from his severe cellulitis likely on top of some degree of lymphedema. His left anterior leg wound has less surface slough as reported by her intake nurse. Small wound at the base of the left second toe 12/02/16; patient's right leg is better and there is no open wound here. His left anterior lateral leg wound continues to have a healthy-looking surface. Small wound at the base of the left second toe however there is erythema in the left forefoot which is worrisome 12/16/16; is no open wounds on his right leg. We took measurements for stockings. His left anterior lateral leg wound continues to have a healthy-looking surface. I'm not sure where we were with the Apligraf run through his insurance. We have been using Iodoflex. He has a thick eschar on the left first second toe interface, I suspect this may be  fungal however there is no visible open 12/23/16; no open wound on his right leg. He has 2 small areas left of the linear wound that was remaining last week. We have been using Prisma, I thought I have disclosed this week, we can only look forward to next week 01/03/17; the patient had concerning areas of erythema last week, already on doxycycline for UTI through his primary doctor. The erythema is absolutely no better there is warmth and swelling both medially from the left lateral leg wound and also the dorsal left foot. 01/06/17- Patient is here  for follow-up evaluation of his left lateral leg ulcer and bilateral feet ulcers. He is on oral antibiotic therapy, tolerating that. Nursing staff and the patient states that the erythema is improved from Monday. 01/13/17; the predominant left lateral leg wound continues to be problematic. I had put Apligraf on him earlier this month once. However he subsequently developed what appeared to be an intense cellulitis around the left lateral leg wound. I gave him Dalvance I think on 2/12 perhaps 2/13 he continues on cefdinir. The erythema is still present but the warmth and swelling is improved. I am hopeful that the cellulitis part of this control. I wouldn't be surprised if there is an element of venous inflammation as well. 01/17/17. The erythema is present but better in the left leg. His left lateral leg wound still does not have a viable surface buttons certain parts of this long thin wound it appears like there has been improvement in dimensions. 01/20/17; the erythema still present but much better in the left leg. I'm thinking this is his usual degree of chronic venous inflammation. The wound on the left leg looks somewhat better. Is less surface slough 01/27/17; erythema is back to the chronic venous inflammation. The wound on the left leg is somewhat better. I am back to the point where I like to try an Apligraf once again 02/10/17; slight improvement in wound dimensions. Apligraf #2. He is completing his doxycycline 02/14/17; patient arrives today having completed doxycycline last Thursday. This was supposed to be a nurse visit however once again he hasn't tense erythema from the medial part of his wound extending over the lower leg. Also erythema in his foot this is roughly in the same distribution as last time. He has baseline chronic venous inflammation however this is a lot worse than the baseline I have learned to accept the on him is baseline inflammation 02/24/17- patient is here for follow-up  evaluation. He is tolerating compression therapy. His voicing no complaints or concerns he is here anticipating an Apligraf 03/03/17; he arrives today with an adherent necrotic surface. I don't think this is surface is going to be amenable for Apligraf's. The erythema around his wound and on the left dorsal foot has resolved he is off antibiotics 03/10/17; better-looking surface today. I don't think he can tolerate Apligraf's. He tells me he had a wound VAC after a skin graft years ago to this area and they had difficulty with a seal. The erythema continues to be stable around this some degree of chronic venous inflammation but he also has recurrent cellulitis. We have been using Iodoflex 03/17/17; continued improvement in the surface and may be small changes in dimensions. Using Iodoflex which seems the only thing that will control his surface 03/24/17- He is here for follow up evaluation of his LLE lateral ulceration and ulcer to right dorsal foot/toe space. He is voicing no complaints or concerns, He is tolerating compression  wrap. 03/31/17 arrives today with a much healthier looking wound on the left lower extremity. We have been using Iodoflex for a prolonged period of time which has for the first time prepared and adequate looking wound bed although we have not had much in the way of wound dimension improvement. He also has a small wound between the first and second toe on the right 04/07/17; arrives today with a healthy-looking wound bed and at least the top 50% of this wound appears to be now her. No debridement was required I have changed him to [facility] last week after prolonged Iodoflex. He did not do well with Apligraf's. We've had a re-opening between the first and second toe on the right 04/14/17; arrives today with a healthier looking wound bed contractions and the top 50% of this wound and some on the lesser 50%. Wound bed appears healthy. The area between the first and second toe on  the right still remains problematic 04/21/17; continued very gradual improvement. Using [facility] 04/28/17; continued very gradual improvement in the left lateral leg venous insufficiency wound. His periwound erythema is very mild. We have been using Hydrofera Blue. Wound is making progress especially in the superior 50% 05/05/17; he continues to have very gradual improvement in the left lateral venous insufficiency wound. Both in terms with an length rings are improving. I debrided this every 2 weeks with #5 curet and we have been using Hydrofera Blue and again making good progress With regards to the wounds between his right first and second toe which I thought might of been tinea pedis he is not making as much progress very dry scaly skin over the area. Also the area at the base of the left first and second toe in a similar condition 05/12/17; continued gradual improvement in the refractory left lateral venous insufficiency wound on the left. Dimension smaller. Surface still requiring debridement using Hydrofera Blue 05/19/17; continued gradual improvement in the refractory left lateral venous ulceration. Careful inspection of the wound bed underlying rumination suggested some degree of epithelialization over the surface no debridement indicated. Continue Hydrofera Blue difficult areas between his toes first and third on the left than first and second on the right. I'm going to change to silver alginate from silver collagen. Continue ketoconazole as I suspect underlying tinea pedis 05/26/17; left lateral leg venous insufficiency wound. We've been using Hydrofera Blue. I believe that there is expanding epithelialization over the surface of the wound albeit not coming from the wound circumference. This is a bit of an odd situation in which the epithelialization seems to be coming from the surface of the wound rather than in the exact circumference. There is still small open areas mostly along the  lateral margin of the wound. ooHe has unchanged areas between the left first and second and the right first second toes which I been treating for tenia pedis 06/02/17; left lateral leg venous insufficiency wound. We have been using Hydrofera Blue. Somewhat smaller from the wound circumference. The surface of the wound remains a bit on it almost epithelialized sedation in appearance. I use an open curette today debridement in the surface of all of this especially the edges ooSmall open wounds remaining on the dorsal right first and second toe interspace and the plantar left first second toe and her face on the left 06/09/17; wound on the left lateral leg continues to be smaller but very gradual and very dry surface using Hydrofera Blue 06/16/17 requires weekly debridements now on the  left lateral leg although this continues to contract. I changed to silver collagen last week because of dryness of the wound bed. Using Iodoflex to the areas on his first and second toes/web space bilaterally 06/24/17; patient with history of paraplegia also chronic venous insufficiency with lymphedema. Has a very difficult wound on the left lateral leg. This has been gradually reducing in terms of with but comes in with a very dry adherent surface. High switch to silver collagen a week or so ago with hydrogel to keep the area moist. This is been refractory to multiple dressing attempts. He also has areas in his first and second toes bilaterally in the anterior and posterior web space. I had been using Iodoflex here after a prolonged course of silver alginate with ketoconazole was ineffective [question tinea pedis] 07/14/17; patient arrives today with a very difficult adherent material over his left lateral lower leg wound. He also has surrounding erythema and poorly controlled edema. He was switched his Santyl last visit which the nurses are applying once during his doctor visit and once on a nurse visit. He was also  reduced to 2 layer compression I'm not exactly sure of the issue here. 07/21/17; better surface today after 1 week of Iodoflex. Significant cellulitis that we treated last week also better. [Doxycycline] 07/28/17 better surface today with now 2 weeks of Iodoflex. Significant cellulitis treated with doxycycline. He has now completed the doxycycline and he is back to his usual degree of chronic venous inflammation/stasis dermatitis. He reminds me he has had ablations surgery here 08/04/17; continued improvement with Iodoflex to the left lateral leg wound in terms of the surface of the wound although the dimensions are better. 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 Problematic areas on the plantar aspect of the first second toe web space on the left and the dorsal aspect of the first second toe web space on the right. At one point I felt these were probably related to chronic fungal infections in treated him aggressively for this although we have not made any improvement here. 08/11/17; left lateral leg. Surface continues to improve with the Iodoflex although we are not seeing much improvement in overall wound dimensions. Areas on his plantar left foot and right foot show no improvement. In fact the right foot looks somewhat worse 08/18/17; left lateral leg. We changed to [facility] Blue last week after a prolonged course of Iodoflex which helps get the surface better. It appears that the wound with is improved. Continue with difficult areas on the left dorsal first second and plantar first second on the right 09/01/17; patient arrives in clinic today having had a temperature of 103 yesterday. He was seen in the ER and [facility]. The patient was concerned he could have cellulitis again in the right leg however they diagnosed him with a UTI and he is now on Keflex. He has a history of cellulitis which is been recurrent and difficult  but this is been in the left leg, in the past 5 use doxycycline. He does in and out catheterizations at home which are risk factors for UTI 09/08/17; patient will be completing his Keflex this weekend. The erythema on the left leg is considerably better. He has a new wound today on the medial part of the right leg small superficial almost looks like a skin tear. He has worsening of the area on the right dorsal first and second toe. His major area on  the left lateral leg is better. Using Hydrofera Blue on all areas 09/15/17; gradual reduction in width on the long wound in the left lateral leg. No debridement required. He also has wounds on the plantar aspect of his left first second toe web space and on the dorsal aspect of the right first second toe web space. 09/22/17; there continues to be very gradual improvements in the dimensions of the left lateral leg wound. He hasn't round erythematous spot with might be pressure on his wheelchair. There is no evidence obviously of infection no purulence no warmth ooHe has a dry scaled area on the plantar aspect of the left first second toe ooImproved area on the dorsal right first second toe. 09/29/17; left lateral leg wound continues to improve in dimensions mostly with an is still a fairly long but increasingly narrow wound. ooHe has a dry scaled area on the plantar aspect of his left first second toe web space ooIncreasingly concerning area on the dorsal right first second toe. In fact I am concerned today about possible cellulitis around this wound. The areas extending up his second toe and although there is deformities here almost appears to abut on the nailbed. 10/06/17; left lateral leg wound continues to make very gradual progress. Tissue culture I did from the right first second toe dorsal foot last time grew MRSA and enterococcus which was vancomycin sensitive. This was not sensitive to clindamycin or doxycycline. He is allergic to Zyvox  and sulfa we have therefore arrange for him to have dalvance infusion tomorrow. He is had this in the past and tolerated it well 10/20/17; left lateral leg wound continues to make decent progress. This is certainly reduced in terms of with there is advancing epithelialization.ooThe cellulitis in the right foot looks better although he still has a deep wound in the dorsal aspect of the first second toe web space. Plantar left first toe web space on the left I think is making some progress 10/27/17; left lateral leg wound continues to make decent progress. Advancing epithelialization.using Hydrofera Blue ooThe right first second toe web space wound is better-looking using silver alginate ooImprovement in the left plantar first second toe web space. Again using silver alginate 11/03/17 left lateral leg wound continues to make decent progress albeit slowly. Using Hydrofera Blue ooThe right per second toe web space continues to be a very problematic looking punched out wound. I obtained a piece of tissue for deep culture I did extensively treated this for fungus. It is difficult to imagine that this is a pressure area as the patient states other than going outside he doesn't really wear shoes at home ooThe left plantar first second toe web space looked fairly senescent. Necrotic edges. This required debridement oochange to Hydrofera Blue to all wound areas 11/10/17; left lateral leg wound continues to contract. Using Hydrofera Blue ooOn the right dorsal first second toe web space dorsally. Culture I did of this area last week grew MRSA there is not an easy oral option in this patient was multiple antibiotic allergies or intolerances. This was only a rare culture isolate I'm therefore going to use Bactroban under silver alginate ooOn the left plantar first second toe web space. Debridement is required here. This is also unchanged 11/17/17; left lateral leg wound continues to contract using  Hydrofera Blue this is no longer the major issue. ooThe major concern here is the right first second toe web space. He now has an open area going from dorsally to the plantar  aspect. There is now wound on the inner lateral part of the first toe. Not a very viable surface on this. There is erythema spreading medially into the forefoot. ooNo major change in the left first second toe plantar wound 11/24/17; left lateral leg wound continues to contract using Hydrofera Blue. Nice improvement today ooThe right first second toe web space all of this looks a lot less angry than last week. I have given him clindamycin and topical Bactroban for MRSA and terbinafine for the possibility of underlining tinea pedis that I could not control with ketoconazole. Looks somewhat better ooThe area on the plantar left first second toe web space is weeping with dried debris around the wound 12/01/17; left lateral leg wound continues to contract he Hydrofera Blue. It is becoming thinner in terms of with nevertheless it is making good improvement. ooThe right first second toe web space looks less angry but still a large necrotic-looking wounds starting on the plantar aspect of the right foot extending between the toes and now extensively on the base of the right second toe. I gave him clindamycin and topical Bactroban for MRSA anterior benefiting for the possibility of underlying tinea pedis. Not looking better today ooThe area on the left first/second toe looks better. Debrided of necrotic debris 12/05/17* the patient was worked in urgently today because over the weekend he found blood on his incontinence bad when he woke up. He was found to have an ulcer by his wife who does most of his wound care. He came in today for [place] to look at this. He has not had a history of wounds in his buttocks in spite of his paraplegia. 12/08/17; seen in follow-up today at his usual appointment. He was seen earlier this week and found to  have a new wound on his buttock. We also follow him for wounds on the left lateral leg, left first second toe web space and right first second toe web space 12/15/17; we have been using Hydrofera Blue to the left lateral leg which has improved. The right first second toe web space has also improved. Left first second toe web space plantar aspect looks stable. The left buttock has worsened using Santyl. Apparently the buttock has drainage 12/22/17; we have been using Hydrofera Blue to the left lateral leg which continues to improve now 2 small wounds separated by normal skin. He tells [place] he had a fever up to 100 yesterday he is prone to UTIs but has not noted anything different. He does in and out catheterizations. The area between the first and second toes today does not look good necrotic surface covered with what looks to be purulent drainage and erythema extending into the third toe. I had gotten this to something that I thought look better last time however it is not look good today. He also has a necrotic surface over the buttock wound which is expanded. I thought there might be infection under here so I removed a lot of the surface with a #5 curet though nothing look like it really needed culturing. He is been using Santyl to this area 12/27/17; his original wound on the left lateral leg continues to improve using Hydrofera Blue. I gave him samples of Baxdella although he was unable to take them out of fear for an allergic reaction ["lump in his throat"].the culture I did of the purulent drainage from his second toe last week showed both enterococcus and a set Enterobacter I was also concerned about the erythema  on the bottom of his foot although paradoxically although this looks somewhat better today. 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 We've been using silver alginate here. They came up today  that he sleeps in a recliner, I'm not sure why but I asked him to stop this 01/03/18; his original wound we've been using Hydrofera Blue is now separated into 2 areas. ooUlcer on his left buttock is better he is off the recliner and sleeping in bed ooFinally both wound areas between his first and second toes also looks some better 01/10/18; his original wound on the left lateral leg is now separated into 2 wounds we've been using Hydrofera Blue ooUlcer on his left buttock has some drainage. There is a small probing site going into muscle layer superiorly.using silver alginate -He arrives today with a deep tissue injury on the left heel ooThe wound on the dorsal aspect of his first second toe on the left looks a lot betterusing silver alginate ketoconazole ooThe area on the first second toe web space on the right also looks a lot bette 01/17/18; his original wound on the left lateral leg continues to progress using Hydrofera Blue ooUlcer on his left buttock also is smaller surface healthier except for a small probing site going into the muscle layer superiorly. 2.4 cm of tunneling in this area ooDTI on his left heel we have only been offloading. Looks better than last week no threatened open no evidence of infection oothe wound on the dorsal aspect of the first second toe on the left continues to look like it's regressing we have only been using silver alginate and terbinafine orally ooThe area in the first second toe web space on the right also looks to be a lot better using silver alginate and terbinafine I think this was prompted by tinea pedis 01/31/18; the patient was hospitalized in [place] last week apparently for a complicated UTI. He was discharged on cefepime he does in and out catheterizations. In the hospital he was discovered M I don't mild elevation of AST and ALTs and the terbinafine was stopped.predictably the pressure ulcer on his buttock looks betterusing  silver alginate. The area on the left lateral leg also is better using Hydrofera Blue. The area between the first and second toes on the left better. First and second toes on the right still substantial but better. Finally the DTI on the left heel has held together and looks like it's resolving 02/07/18-he is here in follow-up evaluation for multiple ulcerations. He has new injury to the lateral aspect of the last issue a pressure ulcer, he states this is from adhesive removal trauma. He states he has tried multiple adhesive products with no success. All other ulcers appear stable. The left heel DTI is resolving. We will continue with same treatment plan and follow-up next week. 02/14/18; follow-up for multiple areas. ooHe has a new area last week on the lateral aspect of his pressure ulcer more over the posterior trochanter. The original pressure ulcer looks quite stable has healthy granulation. 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 Almost fully epithelialized on the original superior area using Hydrofera Blue ooDTI on the left heel has peeled off this week to reveal a small superficial wound under denuded skin and subcutaneous tissue ooBoth areas between the first and second toes look  better including nothing open on the left 02/21/18; ooThe patient's wounds on his left ischial tuberosity and posterior left greater trochanter actually looked better. He has a large area of irritation around the area which I think is contact dermatitis. I am doubtful that this is fungal ooHis original wound on the left lateral calf continues to improve we have been using Hydrofera Blue ooThere is no open area in the left first second toe web space although there is a lot of thick callus ooThe DTI on the left heel required debridement today of necrotic surface eschar and subcutaneous tissue  using silver alginate ooFinally the area on the right first second toe webspace continues to contract using silver alginate and ketoconazole 02/28/18 ooLeft ischial tuberosity wounds look better using silver alginate. ooOriginal wound on the left calf only has one small open area left using Hydrofera Blue ooDTI on the left heel required debridement mostly removing skin from around this wound surface. Using silver alginate ooThe areas on the right first/second toe web space using silver alginate and ketoconazole 03/08/18 on evaluation today patient appears to be doing decently well as best I can tell in regard to his wounds. This is the first time that I have seen him as he generally is followed by Dr. Dellia Nims. With that being said none of his wounds appear to be infected he does have an area where there is some skin covering what appears to be a new wound on the left dorsal surface of his great toe. This is right at the nail bed. With that being said I do believe that debrided away some of the excess skin can be of benefit in this regard. Otherwise he has been tolerating the dressing changes without complication. 03/14/18; patient arrives today with the multiplicity of wounds that we are following. He has not been systemically unwell ooOriginal wound on the left lateral calf now only has 2 small open areas we've been using Hydrofera Blue which should continue ooThe deep tissue injury on the left heel requires debridement today. We've been using silver alginate ooThe left first second toe and the right first second toe are both are reminiscence what I think was tinea pedis. Apparently some of the callus Surface between the toes was removed last week when it started draining. ooPurulent drainage coming from the wound on the ischial tuberosity on the left. 03/21/18-He is here in follow-up evaluation for multiple wounds. 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 He tolerated debridement. The only change to last week's recommendations is to discontinue antifungal cream between toes. He will follow-up next week 03/28/18; following up for multiple wounds;Concern this week is streaking redness and swelling in the right foot. He is going to need antibiotics for this. 03/31/18; follow-up for right foot cellulitis. Streaking redness and swelling in the right foot on 03/28/18. 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 I put him on clindamycin 300 mg every 6 and brought him in for a quick check. He has an open wound between his first and second toes on the right foot as a potential source. 04/04/18; ooRight foot cellulitis is resolving he is completing clindamycin. This is truly good news ooLeft lateral calf wound which is initial wound only has one small open area inferiorly this is close to healing out. He has compression stockings. We will use Hydrofera Blue right down to the epithelialization of this ooNonviable surface on the left heel which  was initially pressure with a DTI. We've been using Hydrofera Blue. I'm going to switch this back to silver alginate ooLeft first second toe/tinea pedis this looks better using silver alginate ooRight first second toe tinea pedis using silver alginate ooLarge pressure ulcers on theLeft ischial tuberosity. Small wound here Looks better. I am uncertain about the surface over the large wound. Using silver alginate 04/11/18; ooCellulitis in the right foot is resolved ooLeft lateral calf wound which was his original wounds still has 2 tiny open areas remaining this is just about closed ooNonviable surface on the left heel is better but still requires debridement ooLeft first second toe/tinea pedis still open using silver alginate ooRight first second toe wound tinea pedis I asked him to go back to using ketoconazole and silver alginate ooLarge  pressure ulcers on the left ischial tuberosity this shear injury here is resolved. Wound is smaller. No evidence of infection using silver alginate 04/18/18; ooPatient arrives with an intense area of cellulitis in the right mid lower calf extending into the right heel area. Bright red and warm. Smaller area on the left anterior leg. He has a significant history of MRSA. He will definitely need antibioticsoodoxycycline ooHe now has 2 open areas on the left ischial tuberosity the original large wound and now a satellite area which I think was above his initial satellite areas. Not a wonderful surface on this satellite area surrounding erythema which looks like pressure related. ooHis left lateral calf wound again his original wound is just about closed ooLeft heel pressure injury still requiring debridement ooLeft first second toe looks a lot better using silver alginate ooRight first second toe also using silver alginate and ketoconazole cream also looks better 04/20/18; the patient was worked in early today out of concerns with his cellulitis on the right leg. I had started him on doxycycline. This was 2 days ago. His wife was concerned about the swelling in the area. Also concerned about the left buttock. He has not been systemically unwell no fever chills. No nausea vomiting or diarrhea 04/25/18; the patient's left buttock wound is continued to deteriorate he is using Hydrofera Blue. He is still completing clindamycin for the cellulitis on the right leg although all of this looks better. 05/02/18 ooLeft buttock wound still with a lot of drainage and a very tightly adherent fibrinous necrotic surface. He has a deeper area superiorly ooThe left lateral calf wound is still closed ooDTI wound on the left heel necrotic surface especially the circumference using Iodoflex ooAreas between his left first second toe and right first second toe both look better. Dorsally and the right  first second toe he had a necrotic surface although at smaller. In using silver alginate and ketoconazole. I did a culture last week which was a deep tissue culture of the reminiscence of the open wound on the right first second toe dorsally. This grew a few Acinetobacter and a few methicillin-resistant staph aureus. Nevertheless the area actually this week looked better. I didn't feel the need to specifically address this at least in terms of systemic antibiotics. 05/09/18; wounds are measuring larger more drainage per our intake. We are using Santyl covered with alginate on the large superficial buttock wounds, Iodosorb on the left heel, ketoconazole and silver alginate to the dorsal first and second toes bilaterally. 05/16/18; ooThe area on his left buttock better in some aspects although the area superiorly over the ischial tuberosity required an extensive debridement.using Santyl ooLeft heel appears stable. Using Iodoflex ooThe areas  between his first and second toes are not bad however there is spreading erythema up the dorsal aspect of his left foot this looks like cellulitis again. He is insensate the erythema is really very brilliant.o Erysipelas He went to see an allergist days ago because he was itching part of this he had lab work done. This showed a white count of 15.1 with 70% neutrophils. Hemoglobin of 11.4 and a platelet count of 659,000. Last white count we had in Epic was a 2-1/2 years ago which was 25.9 but he was ill at the time. He was able to show me some lab work that was done by his primary physician the pattern is about the same. 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 But prompted me to go ahead and do x-rays of both feet and the pelvis rule out osteomyelitis. He also had a comprehensive metabolic panel this was reasonably normal his albumin was 3.7 liver function tests BUN/creatinine all normal 05/23/18; x-rays of both  his feet from last week were negative for underlying pulmonary abnormality. The x-ray of his pelvis however showed mild irregularity in the left ischial which may represent some early osteomyelitis. The wound in the left ischial continues to get deeper clearly now exposed muscle. Each week necrotic surface material over this area. Whereas the rest of the wounds do not look so bad. ooThe left ischial wound we have been using Santyl and calcium alginate ooTo the left heel surface necrotic debris using Iodoflex ooThe left lateral leg is still healed ooAreas on the left dorsal foot and the right dorsal foot are about the same. There is some inflammation on the left which might represent contact dermatitis, fungal dermatitis I am doubtful cellulitis although this looks better than last week 05/30/18; CT scan done at Hospital did not show any osteomyelitis or abscess. Suggested the possibility of underlying cellulitis although I don't see a lot of evidence of this at the bedside ooThe wound itself on the left buttock/upper thigh actually looks somewhat better. No debridement ooLeft heel also looks better no debridement continue Iodoflex ooBoth dorsal first second toe spaces appear better using Lotrisone. Left still required debridement 06/06/18; ooIntake reported some purulent looking drainage from the left gluteal wound. Using Santyl and calcium alginate ooLeft heel looks better although still a nonviable surface requiring debridement ooThe left dorsal foot first/second webspace actually expanding and somewhat deeper. I may consider doing a shave biopsy of this area ooRight dorsal foot first/second webspace appears stable to improved. Using Lotrisone and silver alginate to both these areas 06/13/18 ooLeft gluteal surface looks better. Now separated in the 2 wounds. No debridement required. Still drainage. We'll continue silver alginate ooLeft heel continues to look better with Iodoflex  continue this for at least another week ooOf his dorsal foot wounds the area on the left still has some depth although it looks better than last week. We've been using Lotrisone and silver alginate 06/20/18 ooLeft gluteal continues to look better healthy tissue ooLeft heel continues to look better healthy granulation wound is smaller. He is using Iodoflex and his long as this continues continue the Iodoflex ooDorsal right foot looks better unfortunately dorsal left foot does not. There is swelling and erythema of his forefoot. He had minor trauma to this several days ago but doesn't think this was enough to have caused any tissue injury. Foot looks like cellulitis, we have had this problem before 06/27/18 on evaluation today patient  appears to be doing a little worse in regard to his foot ulcer. 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 Both of which would really be his only options for treating this infection. In the past he has been given and effusion of Orbactiv. This is done very well for him in the past again it's one time dosing IV antibiotic therapy. Subsequently I do believe this is something we're gonna need to see about doing at this point in time. Currently his other wounds seem to be doing somewhat better in my pinion I'm pretty happy in that regard. 07/03/18 on evaluation today patient's wounds actually appear to be doing fairly well. He has been tolerating the dressing changes without complication. All in all he seems to be showing signs of improvement. In regard to the antibiotics he has been dealing with infectious disease since I saw him last week as far as getting this scheduled. In the end [facility]'s [facility] this coming Friday. In the meantime he has been continuing to perform the  dressing changes in such as previous. There does not appear to be any evidence of infection worsengin at this time. 07/10/18; ooSince I last saw this man 2 weeks ago things have actually improved. IV antibiotics of resulted in less forefoot erythema although there is still some present. He is not systemically unwell ooLeft buttock wounds o2 now have no depth there is increased epithelialization Using silver alginate ooLeft heel still requires debridement using Iodoflex ooLeft dorsal foot still with a sizable wound about the size of a border but healthy granulation ooRight dorsal foot still with a slitlike area using silver alginate 07/18/18; the patient's cellulitis in the left foot is improved in fact I think it is on its way to resolving. ooLeft buttock wounds o2 both look better although the larger one has hypertension granulation we've been using silver alginate ooLeft heel has some thick circumferential redundant skin over the wound edge which will need to be removed today we've been using Iodoflex ooLeft dorsal foot is still a sizable wound required debridement using silver alginate ooThe right dorsal foot is just about closed only a small open area remains here 07/25/18; left foot cellulitis is resolved ooLeft buttock wounds o2 both look better. Hyper-granulation on the major area ooLeft heel as some debris over the surface but otherwise looks a healthier wound. Using silver collagen ooRight dorsal foot is just about closed 07/31/18; arrives with our intake nurse worried about purulent drainage from the buttock. We had hyper-granulation here last week ooHis buttock wounds o2 continue to look better ooLeft heel some debris over the surface but measuring smaller. ooRight dorsal foot unfortunately has openings between the toes ooLeft foot superficial wound looks less aggravated. 08/07/18 ooButtock wounds continue to look better although some of her granulation and the  larger medial wound. silver alginate ooLeft heel continues to look a lot better.silver collagen ooLeft foot superficial wound looks less stable. Requires debridement. He has a new wound superficial area on the foot on the lateral dorsal foot. ooRight foot looks better using silver alginate without Lotrisone 08/14/2018; patient was in the ER last week diagnosed with a UTI. He is now on Cefpodoxime and Macrodantin. ooButtock wounds continued to be smaller. Using silver alginate ooLeft heel continues to look better using silver  collagen ooLeft foot superficial wound looks as though it is improving ooRight dorsal foot area is just about healed. 08/21/2018; patient is completed his antibiotics for his UTI. ooHe has 2 open areas on the buttocks. There is still not closed although the surface looks satisfactory. Using silver alginate ooLeft heel continues to improve using silver collagen ooThe bilateral dorsal foot areas which are at the base of his first and second toes/possible tinea pedis are actually stable on the left but worse on the right. The area on the left required debridement of necrotic surface. After debridement I obtained a specimen for PCR culture. ooThe right dorsal foot which is been just about healed last week is now reopened 08/28/2018; culture done on the left dorsal foot showed coag negative staph both staph epidermidis and Lugdunensis. I think this is worthwhile initiating systemic treatment. I will use doxycycline given his long list of allergies. The area on the left heel slightly improved but still requiring debridement. ooThe large wound on the buttock is just about closed whereas the smaller one is larger. Using silver alginate in this area 09/04/2018; patient is completing his doxycycline for the left foot although this continues to be a very difficult wound area with very adherent necrotic debris. We are using silver alginate to all his wounds right foot left  foot and the small wounds on his buttock, silver collagen on the left heel. 09/11/2018; once again this patient has intense erythema and swelling of the left forefoot. Lesser degrees of erythema in the right foot. He has a long list of allergies and intolerances. I will reinstitute doxycycline. 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 Using silver alginate ooLeft heel also looks better using silver collagen ooUnfortunately both the areas on his feet look worse. The area on the left first second webspace is now gone through to the plantar part of his foot. The area on the left foot anteriorly is irritated with erythema and swelling in the forefoot. 09/25/2018 ooHis wound on the left plantar heel looks better. Using silver collagen ooThe area on the left buttock 2 small remnant areas. One is closed one is still open. Using silver alginate ooThe areas between both his first and second toes look worse. This in spite of long-standing antifungal therapy with ketoconazole and silver alginate which should have antifungal activity ooHe has small areas around his original wound on the left calf one is on the bottom of the original scar tissue and one superiorly both of these are small and superficial but again given wound history in this site this is worrisome 10/02/2018 ooLeft plantar heel continues to gradually contract using silver collagen ooLeft buttock wound is unchanged using silver alginate ooThe areas on his dorsal feet between his first and second toes bilaterally look about the same. 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 Small open area here. We have been using silver alginate 10/09/2018; ooLeft plantar heel continues to look healthy and contract.  No debridement is required ooLeft buttock slightly smaller with a tape injury wound just below which was new this week ooDorsal feet somewhat improved I have been using clindamycin ooLeft lateral looks lower extremity the actual open area looks worse although a lot of this is epithelialized. I am going to change to silver collagen  today He has a lot more swelling in the right leg although this is not pitting not red and not particularly warm there is a lot of spasm in the right leg usually indicative of people with paralysis of some underlying discomfort. We have reviewed his vascular status from 2017 he had a left greater saphenous vein ablation. I wonder about referring him back to vascular surgery if the area on the left leg continues to deteriorate. 10/16/2018 in today for follow-up and management of multiple lower extremity ulcers. His left Buttock wound is much lower smaller and almost closed completely. The wound to the left ankle has began to reopen with Epithelialization and some adherent slough. He has multiple new areas to the left foot and leg. The left dorsal foot without much improvement. Wound present between left great webspace and 2nd toe. Erythema and edema present right leg. Right LE ultrasound obtained on 10/10/18 was negative for DVT. 10/23/2018; ooLeft buttock is closed over. Still dry macerated skin but there is no open wound. I suspect this is chronic pressure/moisture ooLeft lateral calf is quite a bit worse than when I saw this last. There is clearly drainage here he has macerated skin into the left plantar heel. We will change the primary dressing to alginate ooLeft dorsal foot has some improvement in overall wound area. Still using clindamycin and silver alginate ooRight dorsal foot about the same as the left using clindamycin and silver alginate ooThe erythema in the right leg has resolved. He is DVT rule out was negative ooLeft heel pressure area required  debridement although the wound is smaller and the surface is health 10/26/2018 ooThe patient came back in for his nurse check today predominantly because of the drainage coming out of the left lateral leg with a recent reopening of his original wound on the left lateral calf. He comes in today with a large amount of surrounding erythema around the wound extending from the calf into the ankle and even in the area on the dorsal foot. He is not systemically unwell. He is not febrile. Nevertheless this looks like cellulitis. We have been using silver alginate to the area. I changed him to a regular visit and I am going to prescribe him doxycycline. The rationale here is a long list of medication intolerances and a history of MRSA. I did not see anything that I thought would provide a valuable culture 10/30/2018 ooFollow-up from his appointment 4 days ago with really an extensive area of cellulitis in the left calf left lateral ankle and left dorsal foot. I put him on doxycycline. He has a long list of medication allergies which are true allergy reactions. 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 In any case he is a lot better today. The erythema especially in the anterior and lateral left calf is better. He still has left ankle erythema. He also is complaining about increasing edema in the right leg we have only been using Kerlix Coban and he has been doing the wraps at home. Finally he has a spotty rash on the medial part of his upper left calf which looks like folliculitis or perhaps wrap occlusion type injury. Small superficial macules not pustules 11/06/18 patient arrives today with again a considerable degree of erythema around the wound on the left lateral calf extending into the dorsal ankle and dorsal foot. This is a lot worse than when I saw this last week. He is on  doxycycline really with not a lot of improvement. He has  not been systemically unwell Wounds on the; left heel actually looks improved. Original area on the left foot and proximity to the first and second toes looks about the same. He has superficial areas on the dorsal foot, anterior calf and then the reopening of his original wound on the left lateral calf which looks about the same ooThe only area he has on the right is the dorsal webspace first and second which is smaller. ooHe has a large area of dry erythematous skin on the left buttock small open area here. 11/13/2018; the patient arrives in much better condition. The erythema around the wound on the left lateral calf is a lot better. Not sure whether this was the clindamycin or the TCA and ketoconazole or just in the improvement in edema control [stasis dermatitis]. In any case this is a lot better. The area on the left heel is very small and just about resolved using silver collagen we have been using silver alginate to the areas on his dorsal feet 11/20/2018; his wounds include the left lateral calf, left heel, dorsal aspects of both feet just proximal to the first second webspace. He is stable to slightly improved. I did not think any changes to his dressings were going to be necessary 11/27/2018 he has a reopening on the left buttock which is surrounded by what looks like tinea or perhaps some other form of dermatitis. The area on the left dorsal foot has some erythema around it I have marked this area but I am not sure whether this is cellulitis or not. Left heel is not closed. Left calf the reopening is really slightly longer and probably worse 1/13; in general things look better and smaller except for the left dorsal foot. Area on the left heel is just about closed, left buttock looks better only a small wound remains in the skin looks better [using Lotrisone] 1/20; the area on the left heel only has a few remaining open areas here. Left lateral calf about the same in terms of size, left  dorsal foot slightly larger right lateral foot still not closed. The area on the left buttock has no open wound and the surrounding skin looks a lot better 1/27; the area on the left heel is closed. Left lateral calf better but still requiring extensive debridements. The area on his left buttock is closed. He still has the open areas on the left dorsal foot which is slightly smaller in the right foot which is slightly expanded. We have been using Iodoflex on these areas as well 2/3; left heel is closed. Left lateral calf still requiring debridement using Iodoflex there is no open area on his left buttock however he has dry scaly skin over a large area of this. Not really responding well to the Lotrisone. Finally the areas on his dorsal feet at the level of the first second webspace are slightly smaller on the right and about the same on the left. Both of these vigorously debrided with Anasept and gauze 2/10; left heel remains closed he has dry erythematous skin over the left buttock but there is no open wound here. Left lateral leg has come in and with. Still requiring debridement we have been using Iodoflex here. Finally the area on the left dorsal foot and right dorsal foot are really about the same extremely dry callused fissured areas. He does not yet have a dermatology appointment 2/17; left heel remains closed. He  has a new open area on the left buttock. The area on the left lateral calf is bigger longer and still covered in necrotic debris. No major change in his foot areas bilaterally. I am awaiting for a dermatologist to look on this. We have been using ketoconazole I do not know that this is been doing any good at all. 2/24; left heel remains closed. The left buttock wound that was new reopening last week looks better. The left lateral calf appears better also although still requires debridement. The major area on his foot is the left first second also requiring debridement. We have been  putting Prisma on all wounds. I do not believe that the ketoconazole has done too much good for his feet. He will use Lotrisone I am going to give him a 2-week course of terbinafine. We still do not have a dermatology appointment 3/2 left heel remains closed however there is skin over bone in this area I pointed this out to him today. The left buttock wound is epithelialized but still does not look completely stable. The area on the left leg required debridement were using silver collagen here. With regards to his feet we changed to Lotrisone last week and silver alginate. 3/9; left heel remains closed. Left buttock remains closed. The area on the right foot is essentially closed. The left foot remains unchanged. Slightly smaller on the left lateral calf. Using silver collagen to both of these areas 3/16-Left heel remains closed. Area on right foot is closed. Left lateral calf above the lateral malleolus open wound requiring debridement with easy bleeding. Left dorsal wound proximal to first toe also debrided. Left ischial area open new. Patient has been using Prisma with wrapping every 3 days. Dermatology appointment is apparently tomorrow.Patient has completed his terbinafine 2-week course with some apparent improvement according to him, there is still flaking and dry skin in his foot on the left 3/23; area on the right foot is reopened. The area on the left anterior foot is about the same still a very necrotic adherent surface. He still has the area on the left leg and reopening is on the left buttock. He apparently saw dermatology although I do not have a note. According to the patient who is usually fairly well informed they did not have any good ideas. Put him on oral terbinafine which she is been on before. 3/30; using silver collagen to all wounds. Apparently his dermatologist put him on doxycycline and rifampin presumably some culture grew staph. I do not have this result. He remains on  terbinafine although I have used terbinafine on him before 4/6; patient has had a fairly substantial reopening on the right foot between the first and second toes. He is finished his terbinafine and I believe is on doxycycline and rifampin still as prescribed by dermatology. We have been using silver collagen to all his wounds although the patient reports that he thinks silver alginate does better on the wounds on his buttock. 4/13; the area on his left lateral calf about the same size but it did not require debridement. ooLeft dorsal foot just proximal to the webspace between the first and second toes is about the same. Still nonviable surface. I note some superficial bronze discoloration of the dorsal part of his foot ooRight dorsal foot just proximal to the first and second toes also looks about the same. I still think there may be the same discoloration I noted above on the left ooLeft buttock wound looks about the  same 4/20; left lateral calf appears to be gradually contracting using silver collagen. 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 The left dorsal foot wound is debrided of tightly adherent necrotic debris and really cleans up quite nicely. The right area is worse with expansion. I did not debride this it is now over the base of the second toe ooThe area on his left buttock is smaller no debridement is required using silver collagen 5/4; left calf continues to make good progress. ooHe arrives with erythema around the wounds on his dorsal foot which even extends to the plantar aspect. Very concerning for coexistent infection. He is finished the erythromycin I gave him for possible erythrasma this does not seem to have helped. ooThe area on the left foot is about the same base of the dorsal toes ooIs area on the buttock looks improved on the left 5/11; left calf and left buttock continued to make good progress. Left  foot is about the same to slightly improved. ooMajor problem is on the right foot. He has not had an x-ray. Deep tissue culture I did last week showed both Enterobacter and E. coli. I did not change the doxycycline I put him on empirically although neither 1 of these were plated to doxycycline. He arrives today with the erythema looking worse on both the dorsal and plantar foot. Macerated skin on the bottom of the foot. he has not been systemically unwell 5/18-Patient returns at 1 week, left calf wound appears to be making some progress, left buttock wound appears slightly worse than last time, left foot wound looks slightly better, right foot redness is marginally better. X-ray of both feet show no air or evidence of osteomyelitis. Patient is finished his Omnicef and terbinafine. He continues to have macerated skin on the bottom of the left foot as well as right 5/26; left calf wound is better, left buttock wound appears to have multiple small superficial open areas with surrounding macerated skin. X-rays that I did last time showed no evidence of osteomyelitis in either foot. He is finished cefdinir and doxycycline. I do not think that he was on terbinafine. He continues to have a large superficial open area on the right foot anterior dorsal and slightly between the first and second toes. I did send him to dermatology 2 months ago or so wondering about whether they would do a fungal scraping. I do not believe they did but did do a culture. We have been using silver alginate to the toe areas, he has been using antifungals at home topically either ketoconazole or Lotrisone. We are using silver collagen on the left foot, silver alginate on the right, silver collagen on the left lateral leg and silver alginate on the left buttock 6/1; left buttock area is healed. We have the left dorsal foot, left lateral leg and right dorsal foot. We are using silver alginate to the areas on both feet and silver  collagen to the area on his left lateral calf 6/8; the left buttock apparently reopened late last week. He is not really sure how this happened. He is tolerating the terbinafine. Using silver alginate to all wounds 6/15; left buttock wound is larger than last week but still superficial. ooCame in the clinic today with a report of purulence from the left lateral leg I did not identify any infection ooBoth areas on his dorsal feet appear to be better. He is tolerating the terbinafine. Using silver alginate to all wounds 6/22;  left buttock is about the same this week, left calf quite a bit better. His left foot is about the same however he comes in with erythema and warmth in the right forefoot once again. Culture that I gave him in the beginning of May showed Enterobacter and E. coli. I gave him doxycycline and things seem to improve although neither 1 of these organisms was specifically plated. 6/29; left buttock is larger and dry this week. Left lateral calf looks to me to be improved. Left dorsal foot also somewhat improved right foot completely unchanged. The erythema on the right foot is still present. He is completing the Ceftin dinner that I gave him empirically [see discussion above.) 7/6 - All wounds look to be stable and perhaps improved, the left buttock wound is slightly smaller, per patient bleeds easily, completed ceftin, the right foot redness is less, he is on terbinafine 7/13; left buttock wound about the same perhaps slightly narrower. Area on the left lateral leg continues to narrow. Left dorsal foot slightly smaller right foot about the same. We are using silver alginate on the right foot and Hydrofera Blue to the areas on the left. Unna boot on the left 2 layer compression on the right 7/20; left buttock wound absolutely the same. Area on lateral leg continues to get better. Left dorsal foot require debridement as did the right no major change in the 7/27; left buttock wound  the same size necrotic debris over the surface. The area on the lateral leg is closed once again. His left foot looks better right foot about the same although there is some involvement now of the posterior first second toe area. He is still on terbinafine which I have given him for a month, not certain a centimeter major change 06/25/19-All wounds appear to be slightly improved according to report, left buttock wound looks clean, both foot wounds have minimal to no debris the right dorsal foot has minimal slough. We are using Hydrofera Blue to the left and silver alginate to the right foot and ischial wound. 8/10-Wounds all appear to be around the same, the right forefoot distal part has some redness which was not there before, however the wound looks clean and small. Ischial wound looks about the same with no changes 8/17; his wound on the left lateral calf which was his original chronic venous insufficiency wound remains closed. Since I last saw him the areas on the left dorsal foot right dorsal foot generally appear better but require debridement. The area on his left initial tuberosity appears somewhat larger to me perhaps hyper granulated and bleeds very easily. We have been using Hydrofera Blue to the left dorsal foot and silver alginate to everything else 8/24; left lateral calf remains closed. The areas on his dorsal feet on the webspace of the first and second toes bilaterally both look better. The area on the left buttock which is the pressure ulcer stage II slightly smaller. I change the dressing to Hydrofera Blue to all areas 8/31; left lateral calf remains closed. The area on his dorsal feet bilaterally look better. Using Hydrofera Blue. Still requiring debridement on the left foot. No change in the left buttock pressure ulcers however 9/14; left lateral calf remains closed. Dorsal feet look quite a bit better than 2 weeks ago. Flaking dry skin also a lot better with the ammonium  lactate I gave him 2 weeks ago. The area on the left buttock is improved. He states that his Roho cushion developed a  leak and he is getting a new one, in the interim he is offloading this vigorously 9/21; left calf remains closed. Left heel which was a possible DTI looks better this week. He had macerated tissue around the left dorsal foot right foot looks satisfactory and improved left buttock wound. I changed his dressings to his feet to silver alginate bilaterally. Continuing Hydrofera Blue on the left buttock. 9/28 left calf remains closed. Left heel did not develop anything [possible DTI] dry flaking skin on the left dorsal foot. Right foot looks satisfactory. Improved left buttock wound. We are using silver alginate on his feet Hydrofera Blue on the buttock. I have asked him to go back to the Lotrisone on his feet including the wounds and surrounding areas 10/5; left calf remains closed. The areas on the left and right feet about the same. A lot of this is epithelialized however debris over the remaining open areas. He is using Lotrisone and silver alginate. The area on the left buttock using Hydrofera Blue 10/26. Patient has been out for 3 weeks secondary to Covid concerns. He tested negative but I think his wife tested positive. He comes in today with the left foot substantially worse, right foot about the same. Even more concerning he states that the area on his left buttock closed over but then reopened and is considerably deeper in one aspect than it was before [stage III wound] 11/2; left foot really about the same as last week. Quarter sized wound on the dorsal foot just proximal to the first second toes. Surrounding erythema with areas of denuded epithelium. This is not really much different looking. Did not look like cellulitis this time however. ooRight foot area about the same.. We have been using silver alginate alginate on his toes ooLeft buttock still substantial irritated  skin around the wound which I think looks somewhat better. We have been using Hydrofera Blue here. 11/9; left foot larger than last week and a very necrotic surface. Right foot I think is about the same perhaps slightly smaller. Debris around the circumference also addressed. Unfortunately on the left buttock there is been a decline. Satellite lesions below the major wound distally and now a an additional one posteriorly we have been using Hydrofera Blue but I think this is a pressure issue 11/16; left foot ulcer dorsally again a very adherent necrotic surface. Right foot is about the same. Not much change in the pressure ulcer on his left buttock. 11/30; left foot ulcer dorsally basically the same as when I saw him 2 weeks ago. Very adherent fibrinous debris on the wound surface. Patient reports a lot of drainage as well. 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 We have been using Iodoflex, patient changed back to alginate because of the drainage. Area on his right dorsal foot really looks benign with a healthier surface certainly a lot better than on the left. Left buttock wounds all improved using Hydrofera Blue 12/7; left dorsal foot again no improvement. Tightly adherent debris. PCR culture I did last week only showed likely skin contaminant. I have gone ahead and done a punch biopsy of this which is about the last thing in terms of investigations I can think to do. He has known venous insufficiency and venous hypertension and this could be the issue here. The area on the right foot is about the same left buttock slightly worse according to our intake nurse secondary to [facility] Blue sticking to the wound  12/14; biopsy of the left foot that I did last time showed changes that could be related to wound healing/chronic stasis dermatitis phenomenon no neoplasm. We have been using silver alginate to both feet. I change the one on the left today  to Sorbact and silver alginate to his other 2 wounds 12/28; the patient arrives with the following problems; ooMajor issue is the dorsal left foot which continues to be a larger deeper wound area. Still with a completely nonviable surface ooParadoxically the area mirror image on the right on the right dorsal foot appears to be getting better. ooHe had some loss of dry denuded skin from the lower part of his original wound on the right lateral calf. Some of this area looked a little vulnerable and for this reason we put him in wrap that on this side this week ooThe area on his left buttock is larger. He still has the erythematous circular area which I think is a combination of pressure, sweat. This does not look like cellulitis or fungal dermatitis Objective Constitutional Sitting or standing Blood Pressure is within target range for patient.. Pulse regular and within target range for patient.Marland Kitchen Respirations regular, non-labored and within target range.. Temperature is normal and within the target range for the patient.Marland Kitchen Appears in no distress. Vitals Time Taken: 7:50 AM, Height: 70 in, Weight: 216 lbs, BMI: 31, Temperature: 97.8 F, Pulse: 116 bpm, Respiratory Rate: 16 breaths/min, Blood Pressure: 133/73 mmHg. General Notes: Wound exam ooLeft dorsal foot. Deeper and more punched out wound. Debrided with a #5 curette very adherent necrotic debris. ooRight dorsal foot paradoxically improved only a small open area remains. Thick dry skin in these areas. ooLeft lateral calf no open area here however some of it looked a little vulnerable after removal of flaking dry skin. This is his presenting wound secondary to chronic venous insufficiency ooLeft buttock larger same senescent area. Integumentary (Hair, Skin) Wound #17 status is Open. Original cause of wound was Gradually Appeared. The wound is located on the Right,Dorsal Toe - Web between 1st and 2nd. The wound measures 0.4cm length x  0.7cm width x 0.1cm depth; 0.22cm^2 area and 0.022cm^3 volume. There is Fat Layer (Subcutaneous Tissue) Exposed exposed. There is no tunneling or undermining noted. There is a small amount of serosanguineous drainage noted. The wound margin is distinct with the outline attached to the wound base. There is large (67-100%) pale granulation within the wound bed. There is a small (1-33%) amount of necrotic tissue within the wound bed including Adherent Slough. Wound #24 status is Open. Original cause of wound was Gradually Appeared. The wound is located on the Left,Dorsal Foot. The wound measures 3cm length x 2.3cm width x 0.4cm depth; 5.419cm^2 area and 2.168cm^3 volume. There is Fat Layer (Subcutaneous Tissue) Exposed exposed. There is no tunneling or undermining noted. There is a medium amount of purulent drainage noted. The wound margin is distinct with the outline attached to the wound base. There is no granulation within the wound bed. There is a large (67-100%) amount of necrotic tissue within the wound bed including Adherent Slough. Wound #35 status is Open. Original cause of wound was Gradually Appeared. The wound is located on the Left Ischium. The wound measures 3cm length x 3.5cm width x 0.1cm depth; 8.247cm^2 area and 0.825cm^3 volume. There is Fat Layer (Subcutaneous Tissue) Exposed exposed. There is no tunneling or undermining noted. There is a medium amount of serosanguineous drainage noted. The wound margin is flat and intact. There is large (  67-100%) red, friable granulation within the wound bed. There is a small (1-33%) amount of necrotic tissue within the wound bed including Adherent Slough. 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 There was a Excisional Skin/Subcutaneous Tissue Debridement with a total area of 6.9 sq cm performed by Ricard Dillon., MD. With the following instrument(s): Curette to remove Viable and Non-Viable tissue/material. Material removed includes Subcutaneous Tissue and Slough and. No specimens were taken. A time out was conducted at 08:24, prior to the start of the procedure. A Minimum amount of bleeding was controlled with Pressure. The procedure was tolerated well with a pain level of 0 throughout and a pain level of 0 following the procedure. Post Debridement Measurements: 3cm length x 2.3cm width x 0.4cm depth; 2.168cm^3 volume. Character of Wound/Ulcer Post Debridement requires further debridement. Post procedure Diagnosis Wound #24: Same as Pre-Procedure Pre-procedure diagnosis of Wound #24 is an Inflammatory located on the Left,Dorsal Foot . There was a Three Layer Compression Therapy Procedure by Levan Hurst, RN. Post procedure Diagnosis Wound #24: Same as Pre-Procedure Plan Follow-up Appointments: Return Appointment in 1 week. Dressing Change Frequency: Wound #17 Right,Dorsal Toe - Web between 1st and 2nd: Change Dressing every other day. Wound #24 Left,Dorsal Foot: Change Dressing every other day. Wound #35 Left Ischium: Change Dressing every other day. Skin Barriers/Peri-Wound Care: Antifungal cream - on toes on both feet daily Moisturizing lotion - both legs Other: - Triamcinolone cream Wound #35 Left Ischium: Barrier cream Wound Cleansing: Wound #17 Right,Dorsal Toe - Web between 1st and 2nd: Clean wound with Wound Cleanser Primary Wound Dressing: Wound #17 Right,Dorsal Toe - Web between 1st and 2nd: Calcium Alginate with Silver Wound #24 Left,Dorsal Foot: Calcium Alginate with Silver - alginate on weeping area on left lateral lower leg Other: - Sorbact on foot Wound #35  Left Ischium: Calcium Alginate with Silver Secondary Dressing: Wound #17 Right,Dorsal Toe - Web between 1st and 2nd: Kerlix/Rolled Gauze Dry Gauze Wound #24 Left,Dorsal Foot: Dry Gauze Heel Cup - add heel cup to left heel for protection. Wound #35 Left Ischium: Foam Border Edema Control: 3 Layer Compression System - Left Lower Extremity Elevate legs to the level of the heart or above for 30 minutes daily and/or when sitting, a frequency of: - throughout the day Support Garment 30-40 mm/Hg pressure to: - Juxtalite to right leg Off-Loading: Low air-loss mattress (Group 2) Roho cushion for wheelchair Turn and reposition every 2 hours - out of wheelchair throughout the day, try to lay on sides, sleep in the bed not the recliner Services and Therapies ordered were: Arterial Studies- Bilateral with ABIs and TBIs - Non healing ulcers on bilateral lower extremities Radiology ordered were: X-ray, left foot complete view - Non healing ulcer on left dorsal foot 1. I continued with Sorbact of the left foot. We will recheck ABIs and plain x-ray. Perhaps an MRI. 2. Notable that I could feel his dorsalis pedis on the left but not the posterior tibial. The popliteal and left femoral pulses were palpable but reduced 3. Because of the flaking skin off the original left calf wound I went ahead and wrapped his leg under 3 layer compression #4 repeat his ABIs and  plain x-ray may consider an MRI 5. I have biopsied and deep culture of this wound which was unhelpful [left foot] 6. No change the silver alginate on the right foot and left buttock Electronic Signature(s) Signed: 11/19/2019 6:10:19 PM By: Linton Ham MD Entered By: Linton Ham on 11/19/2019 08:57:40 -------------------------------------------------------------------------------- SuperBill Details Patient Name: Date of Service: [name] 11/19/2019 Medical Record [id number] Patient Account Number: [id number] Date of  Birth/Sex: Treating RN: Feb 19, 1986 (33 y.o. 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

## 2019-11-19 NOTE — Progress Notes (Addendum)
[name], [name] ([id number]) Visit Report for 11/19/2019 Arrival Information Details Patient Name: Date of Service: [name], [name] 11/19/2019 7:30 AM Medical Record [id number] Patient Account Number: [id number] Date of Birth/Sex: Treating RN: June 01, 1986 (33 y.o. [name] Primary Care [name]: [name], [name] Other Clinician: Referring [name]: Treating [name]/Extender:[name], [name] in Treatment: 202 Visit Information History Since Last Visit Added or deleted any medications: No Patient Arrived: Wheel Chair Any new allergies or adverse reactions: No Arrival Time: 07:47 Had a fall or experienced change in No activities of daily living that may affect Accompanied By: self risk of falls: Transfer Assistance: None Signs or symptoms of abuse/neglect since last No Patient Identification Verified: Yes visito Secondary Verification Process Completed: Yes Hospitalized since last visit: No Patient Requires Transmission-Based No Implantable device outside of the clinic excluding No Precautions: cellular tissue based products placed in the center Patient Has Alerts: Yes since last visit: Patient Alerts: R ABI = Has Dressing in Place as Prescribed: Yes 1.0 Has Compression in Place as Prescribed: Yes L ABI = Pain Present Now: No 1.1 Electronic Signature(s) Signed: 11/19/2019 5:44:52 PM By: [name] Entered By: [name] on 11/19/2019 07:50:57 -------------------------------------------------------------------------------- Compression Therapy Details Patient Name: Date of Service: CRIBBMakahi, [name] 11/19/2019 7:30 AM Medical Record [id number] Patient Account Number: [id number] Date of Birth/Sex: Treating RN: Mar 11, 1986 (33 y.o. [name] Primary Care [name]: [place], [place] Other Clinician: Referring Davian Hanshaw: Treating [name]/Extender:[name], [name] in Treatment: 202 Compression Therapy Performed for  Wound Wound #24 Left,Dorsal Foot Assessment: Performed By: Clinician [name] Hurst, RN Compression Type: Three Layer Post Procedure Diagnosis Same as Pre-procedure Electronic Signature(s) Signed: 11/19/2019 6:10:06 PM By: [name] Hurst RN, BSN Entered By: [name] on 11/19/2019 08:37:51 -------------------------------------------------------------------------------- Encounter Discharge Information Details Patient Name: Date of Service: [name], [name] 11/19/2019 7:30 AM Medical Record [id number] Patient Account Number: [id number] Date of Birth/Sex: Treating RN: 1986-08-02 (33 y.o. [name] Primary Care [name]: [name], [name] Other Clinician: Referring [name]: Treating [name]/Extender:[name], [name] in Treatment: 202 Encounter Discharge Information Items Post Procedure Vitals Discharge Condition: Stable Temperature (F): 97. Ambulatory Status: Wheelchair Pulse (bpm): 116 Discharge Destination: Home Respiratory Rate (breaths/min): 16 Transportation: Private Auto Blood Pressure (mmHg): 133/73 Accompanied By: self Schedule Follow-up Appointment: Yes Clinical Summary of Care: Electronic Signature(s) Signed: 11/19/2019 5:44:52 PM By: [name] Entered By: [name] on 11/19/2019 08:54:21 -------------------------------------------------------------------------------- Lower Extremity Assessment Details Patient Name: Date of Service: [name], [name] 11/19/2019 7:30 AM Medical Record [id number] Patient Account Number: [id number] Date of Birth/Sex: Treating RN: 1986-08-22 (33 y.o. [name] Primary Care Anae Hams: [place], [place] Other Clinician: Referring [name]: Treating [name]/Extender:[name], [name] in Treatment: 202 Edema Assessment Assessed: [Left: Yes] [Right: Yes] Edema: [Left: No] [Right: No] Calf Left: Right: Point of Measurement: 33 cm From Medial Instep 29 cm 33 cm Ankle Left:  Right: Point of Measurement: 10 cm From Medial Instep 23 cm 23 cm Vascular Assessment Pulses: Dorsalis Pedis Palpable: [Left:Yes] [Right:Yes] Electronic Signature(s) Signed: 11/19/2019 5:44:52 PM By: [name] Entered By: [name] on 11/19/2019 07:55:23 -------------------------------------------------------------------------------- Multi Wound Chart Details Patient Name: Date of Service: [name] Nicely. 11/19/2019 7:30 AM Medical Record [id number] Patient Account Number: [id number] Date of Birth/Sex: Treating RN: Jan 25, 1986 (33 y.o. M) Primary Care [name]: [name], [name] Other Clinician: Referring Emilly Lavey: Treating [name]/Extender:[name], [name] in Treatment: 202 Vital Signs Height(in): 70 Pulse(bpm): 116 Weight(lbs): 216 Blood Pressure(mmHg): 133/73 Body Mass Index(BMI): 31 Temperature(F): 97.8 Respiratory 16 Rate(breaths/min):  Photos: [17:No Photos] [24:No Photos] [35:No Photos] Wound Location: [17:Right Toe - Web between Left Foot - Dorsal 1st and 2nd - Dorsal] [35:Left Ischium] Wounding Event: [17:Gradually Appeared] [24:Gradually Appeared] [35:Gradually Appeared] Primary Etiology: [17:Atypical] [24:Inflammatory] [35:Pressure Ulcer] Comorbid History: [17:Sleep Apnea, Hypertension, Sleep Apnea, Hypertension, Sleep Apnea, Hypertension, Paraplegia] [24:Paraplegia] [35:Paraplegia] Date Acquired: [17:04/07/2017] [24:03/08/2018] [35:04/28/2019] [name] of Treatment: [17:136] [24:88] [35:29] Wound Status: [17:Open] [24:Open] [35:Open] Clustered Wound: [17:Yes] [24:No] [35:Yes] Clustered Quantity: [17:1] [24:N/A] [35:2] Measurements L x W x D 0.4x0.7x0.1 [24:3x2.3x0.4] [35:3x3.5x0.1] (cm) Area (cm) : [17:0.22] [id number] [35:8.247] Volume (cm) : [17:0.022] [24:2.168] [35:0.825] % Reduction in Area: [17:62.60%] [24:-2196.20%] [35:41.70%] % Reduction in Volume: [17:62.70%] [24:-8933.30%] [35:41.70%] Classification: [17:Full Thickness  Without Exposed Support Structures Exposed Support Structures] [24:Full Thickness Without] [35:Category/Stage III] Exudate Amount: [17:Small] [24:Medium] [35:Medium] Exudate Type: [17:Serosanguineous] [24:Purulent] [35:Serosanguineous] Exudate Color: [17:red, brown] [24:yellow, brown, green] [35:red, brown] Wound Margin: [17:Distinct, outline attached Distinct, outline attached Flat and Intact] Granulation Amount: [17:Large (67-100%)] [24:None Present (0%)] [35:Large (67-100%)] Granulation Quality: [17:Pale] [24:N/A] [35:Red, Friable] Necrotic Amount: [17:Small (1-33%)] [24:Large (67-100%)] [35:Small (1-33%)] Exposed Structures: [17:Fat Layer (Subcutaneous Fat Layer (Subcutaneous Fat Layer (Subcutaneous Tissue) Exposed: Yes Fascia: No Tendon: No Muscle: No Joint: No Bone: No] [24:Tissue) Exposed: Yes Fascia: No Tendon: No Muscle: No Joint: No Bone: No] [35:Tissue) Exposed: Yes  Fascia: No Tendon: No Muscle: No Joint: No Bone: No] Epithelialization: [17:Large (67-100%)] [24:Small (1-33%)] [35:Small (1-33%)] Debridement: [17:N/A] [24:Debridement - Excisional N/A] Pre-procedure [17:N/A] [24:08:24] [35:N/A] Verification/Time Out Taken: Tissue Debrided: [17:N/A] [24:Subcutaneous, Slough] [35:N/A] Level: [17:N/A] [24:Skin/Subcutaneous Tissue] [35:N/A] Debridement Area (sq cm):N/A [24:6.9] [35:N/A] Instrument: [17:N/A] [24:Curette] [35:N/A] Bleeding: [17:N/A] [24:Minimum] [35:N/A] Hemostasis Achieved: [17:N/A] [24:Pressure] [35:N/A] Procedural Pain: [17:N/A] [24:0] [35:N/A] Post Procedural Pain: [17:N/A] [24:0] [35:N/A] Debridement Treatment N/A [24:Procedure was tolerated] [35:N/A] Response: [24:well] Post Debridement [17:N/A] [24:3x2.3x0.4] [35:N/A] Measurements L x W x D (cm) Post Debridement [17:N/A] [24:2.168] [35:N/A] Volume: (cm) Procedures Performed: N/A [24:Compression Therapy Debridement] [35:N/A] Treatment Notes Electronic Signature(s) Signed: 11/19/2019 6:10:19 PM By: Linton Ham MD Entered By: Linton Ham on 11/19/2019 08:50:43 -------------------------------------------------------------------------------- Multi-Disciplinary Care Plan Details Patient Name: Date of Service: [name] Nicely. 11/19/2019 7:30 AM Medical Record [id number] Patient Account Number: [id number] Date of Birth/Sex: Treating RN: 1986-03-14 (33 y.o. [name] Primary Care Shenelle Klas: [name], [name] Other Clinician: Referring Saul Dorsi: Treating Lendon George/Extender:[name], [name] in Treatment: 202 Active Inactive Wound/Skin Impairment Nursing Diagnoses: Impaired tissue integrity Knowledge deficit related to ulceration/compromised skin integrity Goals: Patient/caregiver will verbalize understanding of skin care regimen Date Initiated: 01/05/2016 Target Resolution Date: 12/21/2019 Goal Status: Active Ulcer/skin breakdown will have a volume reduction of 30% by week 4 Date Initiated: 01/05/2016 Date Inactivated: 12/22/2017 Target Resolution Date: 01/19/2018 Unmet Reason: complex Goal Status: Unmet wounds, infection Interventions: Assess patient/caregiver ability to obtain necessary supplies Assess ulceration(s) every visit Provide education on ulcer and skin care Notes: Electronic Signature(s) Signed: 11/19/2019 6:10:06 PM By: [name] Hurst RN, BSN Entered By: [name] on 11/19/2019 08:15:57 -------------------------------------------------------------------------------- Pain Assessment Details Patient Name: Date of Service: Bleier, [name] 11/19/2019 7:30 AM Medical Record [id number] Patient Account Number: [id number] Date of Birth/Sex: Treating RN: 06/22/1986 (33 y.o. [name] Primary Care Salinda Snedeker: [place], [place] Other Clinician: Referring Travelle Mcclimans: Treating Beatryce Colombo/Extender:[name], [name] in Treatment: 202 Active Problems Location of Pain Severity and Description of Pain Patient Has Paino  No Site Locations Rate the pain. Current Pain Level: 0 Pain Management and Medication Current Pain Management: Medication: No Cold Application: No Rest: No Massage: No Activity: No T.FerrellN.S.:  No Heat Application: No Leg drop or elevation: No Is the Current Pain Management Adequate: Adequate How does your wound impact your activities of daily livingo Sleep: No Bathing: No Appetite: No Relationship With Others: No Bladder Continence: No Emotions: No Bowel Continence: No Work: No Toileting: No Drive: No Dressing: No Hobbies: No Electronic Signature(s) Signed: 11/19/2019 5:44:52 PM By: [name] Entered By: [name] on 11/19/2019 07:52:12 -------------------------------------------------------------------------------- Patient/Caregiver Education Details Patient Name: Date of Service: CRIBBGrace Bushy 12/28/2020andnbsp7:30 AM Medical Record [id number] Patient Account Number: [id number] Date of Birth/Gender: 05-Dec-1985 (33 y.o. M) Treating RN: [name] Primary Care Physician: Janine Limbo Other Clinician: Referring Physician: Treating Physician/Extender:Robson, Pecola Leisure in Treatment: 55 Education Assessment Education Provided To: Patient Education Topics Provided Wound/Skin Impairment: Methods: Explain/Verbal Responses: State content correctly Electronic Signature(s) Signed: 11/19/2019 6:10:06 PM By: [name] Hurst RN, BSN Entered By: [name] on 11/19/2019 08:16:18 -------------------------------------------------------------------------------- Wound Assessment Details Patient Name: Date of Service: [name], [name] 11/19/2019 7:30 AM Medical Record [id number] Patient Account Number: [id number] Date of Birth/Sex: Treating RN: November 21, 1986 (33 y.o. [name] Primary Care Jassiah Viviano: [name], [name] Other Clinician: Referring Aaliyan Brinkmeier: Treating Shiasia Porro/Extender:[name], [name] in Treatment:  202 Wound Status Wound Number: 17 Primary Etiology: Atypical Wound Location: Right Toe - Web between 1st and Wound Status: Open 2nd - Dorsal Comorbid Sleep Apnea, Hypertension, Wounding Event: Gradually Appeared History: Paraplegia Date Acquired: 04/07/2017 [name] Of Treatment: 136 Clustered Wound: Yes Photos Wound Measurements Length: (cm) 0.4 % Reduct Width: (cm) 0.7 % Reduct Depth: (cm) 0.1 Epitheli Clustered Quantity: 1 Tunnelin Area: (cm) 0.22 Undermi Volume: (cm) 0.022 Wound Description Classification: Full Thickness Without Exposed Support Foul Od Structures Slough/ Wound Distinct, outline attached Margin: Exudate Small Amount: Exudate Serosanguineous Type: Exudate red, brown Color: Wound Bed Granulation Amount: Large (67-100%) Granulation Quality: Pale Fascia Ex Necrotic Amount: Small (1-33%) Fat Layer Necrotic Quality: Adherent Slough Tendon Ex Muscle Ex Joint Exp Bone Expo or After Cleansing: No Fibrino Yes Exposed Structure posed: No (Subcutaneous Tissue) Exposed: Yes posed: No posed: No osed: No sed: No ion in Area: 62.6% ion in Volume: 62.7% alization: Large (67-100%) g: No ning: No Treatment Notes Wound #17 (Right, Dorsal Toe - Web between 1st and 2nd) 1. Cleanse With Wound Cleanser 2. Periwound Care Antifungal cream 3. Primary Dressing Applied Calcium Alginate Ag 4. Secondary Dressing Dry Gauze Roll Gauze 5. Secured With Recruitment consultant) Signed: 11/21/2019 3:39:15 PM By: Mikeal Hawthorne EMT/HBOT Signed: 11/21/2019 5:32:50 PM By: [name] Previous Signature: 11/19/2019 5:44:52 PM Version By: [name] Entered By: Mikeal Hawthorne on 11/21/2019 12:44:12 -------------------------------------------------------------------------------- Wound Assessment Details Patient Name: Date of Service: [name], [name] 11/19/2019 7:30 AM Medical Record [id number] Patient Account Number: [id number] Date of  Birth/Sex: Treating RN: 1985/12/31 (33 y.o. [name] Primary Care Iness Pangilinan: [place], [place] Other Clinician: Referring Denetta Fei: Treating Dereke Neumann/Extender:[name], [name] in Treatment: 202 Wound Status Wound Number: 24 Primary Etiology: Inflammatory Wound Location: Left Foot - Dorsal Wound Status: Open Wounding Event: Gradually Appeared Comorbid Sleep Apnea, Hypertension, History: Paraplegia Date Acquired: 03/08/2018 [name] Of Treatment: 88 Clustered Wound: No Photos Wound Measurements Length: (cm) 3 % Reduct Width: (cm) 2.3 % Reduct Depth: (cm) 0.4 Epitheli Area: (cm) 5.419 Tunneli Volume: (cm) 2.168 Undermi Wound Description Full Thickness Without Exposed Support Foul Odo Classification: Structures Slough/F Wound Distinct, outline attached Margin: Exudate Medium Amount: Exudate Purulent Type: Exudate yellow, brown, green Color: Wound Bed Granulation Amount: None Present (0%) Necrotic Amount: Large (67-100%) Fascia E  Necrotic Quality: Adherent Slough Fat Laye Tendon E Muscle E Joint Ex Bone Exp r After Cleansing: No ibrino Yes Exposed Structure xposed: No r (Subcutaneous Tissue) Exposed: Yes xposed: No xposed: No posed: No osed: No ion in Area: -2196.2% ion in Volume: -8933.3% alization: Small (1-33%) ng: No ning: No Treatment Notes Wound #24 (Left, Dorsal Foot) 1. Cleanse With Wound Cleanser 2. Periwound Care Antifungal cream Moisturizing lotion 3. Primary Dressing Applied Other primary dressing (specifiy in notes) 4. Secondary Dressing Dry Gauze Kerramax/Xtrasorb 6. Support Layer Applied 3 layer compression wrap Notes primary dressing cutimed sorbact swab. Electronic Signature(s) Signed: 11/21/2019 3:39:15 PM By: Mikeal Hawthorne EMT/HBOT Signed: 11/21/2019 5:32:50 PM By: [name] Previous Signature: 11/19/2019 5:44:52 PM Version By: [name] Entered By: Mikeal Hawthorne on 11/21/2019  12:43:29 -------------------------------------------------------------------------------- Wound Assessment Details Patient Name: Date of Service: [name], [name] 11/19/2019 7:30 AM Medical Record [id number] Patient Account Number: [id number] Date of Birth/Sex: Treating RN: July 07, 1986 (33 y.o. [name] Primary Care Aleja Yearwood: [place], [place] Other Clinician: Referring Luismanuel Corman: Treating Vauda Salvucci/Extender:[name], [name] in Treatment: 202 Wound Status Wound Number: 35 Primary Etiology: Pressure Ulcer Wound Location: Left Ischium Wound Status: Open Wounding Event: Gradually Appeared Comorbid Sleep Apnea, Hypertension, History: Paraplegia Date Acquired: 04/28/2019 [name] Of Treatment: 29 Clustered Wound: Yes Photos Wound Measurements Length: (cm) 3 % Reducti Width: (cm) 3.5 % Reducti Depth: (cm) 0.1 Epithelia Clustered Quantity: 2 Tunneling Area: (cm) 8.247 Undermin Volume: (cm) 0.825 Wound Description Classification: Category/Stage III Wound Margin: Flat and Intact Exudate Amount: Medium Exudate Type: Serosanguineous Exudate Color: red, brown Wound Bed Granulation Amount: Large (67-100%) Granulation Quality: Red, Friable Necrotic Amount: Small (1-33%) Necrotic Quality: Adherent Slough Foul Odor After Cleansing: No Slough/Fibrino Yes Exposed Structure Fascia Exposed: No Fat Layer (Subcutaneous Tissue) Exposed: Yes Tendon Exposed: No Muscle Exposed: No Joint Exposed: No Bone Exposed: No on in Area: 41.7% on in Volume: 41.7% lization: Small (1-33%) : No ing: No Treatment Notes Wound #35 (Left Ischium) 1. Cleanse With Wound Cleanser 3. Primary Dressing Applied Calcium Alginate Ag 4. Secondary Dressing Foam Border Dressing 5. Secured With Office manager) Signed: 11/21/2019 3:39:15 PM By: Mikeal Hawthorne EMT/HBOT Signed: 11/21/2019 5:32:50 PM By: [name] Previous Signature: 11/19/2019 5:44:52  PM Version By: [name] Entered By: Mikeal Hawthorne on 11/21/2019 12:45:43 -------------------------------------------------------------------------------- Vitals Details Patient Name: Date of Service: [name], [name] 11/19/2019 7:30 AM Medical Record [id number] Patient Account Number: [id number] Date of Birth/Sex: Treating RN: [birth year] (33 y.o. [name] Primary Care Dorsel Flinn: [place], [place] Other Clinician: Referring Ludell Zacarias: Treating Beau Vanduzer/Extender:[name], [name] in Treatment: 202 Vital Signs Time Taken: 07:50 Temperature (F): 97.8 Height (in): 70 Pulse (bpm): 116 Weight (lbs): 216 Respiratory Rate (breaths/min): 16 Body Mass Index (BMI): 31 Blood Pressure (mmHg): 133/73 Reference Range: 80 - 120 mg / dl Electronic Signature(s) Signed: 11/19/2019 5:44:52 PM By: [name] Entered By: [name] on 11/19/2019 07:52:02

## 2019-11-21 ENCOUNTER — Other Ambulatory Visit: Payer: Self-pay

## 2019-11-21 ENCOUNTER — Other Ambulatory Visit (HOSPITAL_COMMUNITY): Payer: Self-pay | Admitting: Internal Medicine

## 2019-11-21 ENCOUNTER — Ambulatory Visit (HOSPITAL_COMMUNITY)
Admission: RE | Admit: 2019-11-21 | Discharge: 2019-11-21 | Disposition: A | Discharge status: Home / Self Care | Payer: BC Managed Care – PPO | Source: Ambulatory Visit | Attending: Internal Medicine | Admitting: Internal Medicine

## 2019-11-21 DIAGNOSIS — L97529 Non-pressure chronic ulcer of other part of left foot with unspecified severity: Secondary | ICD-10-CM

## 2019-11-26 ENCOUNTER — Encounter (HOSPITAL_BASED_OUTPATIENT_CLINIC_OR_DEPARTMENT_OTHER): Payer: BC Managed Care – PPO | Admitting: Internal Medicine

## 2019-11-26 ENCOUNTER — Other Ambulatory Visit: Payer: Self-pay

## 2019-11-26 DIAGNOSIS — L98492 Non-pressure chronic ulcer of skin of other sites with fat layer exposed: Secondary | ICD-10-CM | POA: Diagnosis not present

## 2019-11-26 DIAGNOSIS — I872 Venous insufficiency (chronic) (peripheral): Secondary | ICD-10-CM | POA: Diagnosis not present

## 2019-11-26 DIAGNOSIS — Z888 Allergy status to other drugs, medicaments and biological substances status: Secondary | ICD-10-CM | POA: Diagnosis not present

## 2019-11-26 DIAGNOSIS — L97322 Non-pressure chronic ulcer of left 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: Secondary | ICD-10-CM | POA: Insufficient documentation

## 2019-11-26 DIAGNOSIS — Z87891 Personal history of nicotine dependence: Secondary | ICD-10-CM | POA: Insufficient documentation

## 2019-11-26 DIAGNOSIS — Z880 Allergy status to penicillin: Secondary | ICD-10-CM | POA: Diagnosis not present

## 2019-11-26 DIAGNOSIS — Z881 Allergy status to other antibiotic agents status: Secondary | ICD-10-CM | POA: Insufficient documentation

## 2019-11-26 DIAGNOSIS — L97522 Non-pressure chronic ulcer of other part of left foot with fat layer exposed: Secondary | ICD-10-CM | POA: Insufficient documentation

## 2019-11-26 DIAGNOSIS — Z8614 Personal history of Methicillin resistant Staphylococcus aureus infection: Secondary | ICD-10-CM | POA: Diagnosis not present

## 2019-11-26 DIAGNOSIS — Z882 Allergy status to sulfonamides status: Secondary | ICD-10-CM | POA: Diagnosis not present

## 2019-11-26 DIAGNOSIS — L03116 Cellulitis of left lower limb: Secondary | ICD-10-CM | POA: Diagnosis not present

## 2019-11-26 DIAGNOSIS — L97512 Non-pressure chronic ulcer of other part of right foot with fat layer exposed: Secondary | ICD-10-CM | POA: Diagnosis not present

## 2019-11-26 DIAGNOSIS — L89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, stage 3: Secondary | ICD-10-CM | POA: Diagnosis not present

## 2019-11-26 DIAGNOSIS — G8221 Paraplegia, complete: Secondary | ICD-10-CM | POA: Insufficient documentation

## 2019-11-26 DIAGNOSIS — S91101A Unspecified open wound of right great toe without damage to nail, initial encounter: Secondary | ICD-10-CM | POA: Diagnosis not present

## 2019-11-26 NOTE — Progress Notes (Signed)
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 areas in his first and second toes bilaterally in the anterior and posterior web space. I had been using Iodoflex here after a prolonged course of silver alginate with ketoconazole was ineffective [question tinea pedis] 07/14/17; patient arrives today with a very difficult adherent material over his left lateral lower leg wound. He also has surrounding erythema and poorly controlled edema. He was switched his Santyl last visit which the nurses are applying once during his doctor visit and once on a nurse visit. He was also reduced to 2 layer compression I'm not exactly sure of the issue here. 07/21/17; better surface today after 1 week of Iodoflex. Significant cellulitis that we treated last week also better. [Doxycycline] 07/28/17 better surface today with now 2 weeks of Iodoflex. Significant cellulitis treated with doxycycline. He has now completed the doxycycline and he is back to his usual degree of chronic venous inflammation/stasis dermatitis. He reminds me he has had ablations surgery here 08/04/17;  continued improvement with Iodoflex to the left lateral leg wound in terms of the surface of the wound although the dimensions are better. He is  not currently on any antibiotics, he has the usual degree of chronic venous inflammation/stasis dermatitis. Problematic areas on the plantar aspect of the first second toe web space on the left and the dorsal aspect of the first second toe web space on the right. At one point I felt these were probably related to chronic fungal infections in treated him aggressively for this although we have not made any improvement here. 08/11/17; left lateral leg. Surface continues to improve with the Iodoflex although we are not seeing much improvement in overall wound dimensions. Areas on his plantar left foot and right foot show no improvement. In fact the right foot looks somewhat worse 08/18/17; left lateral leg. We changed to [facility] Blue last week after a prolonged course of Iodoflex which helps get the surface better. It appears that the wound with is improved. Continue with difficult areas on the left dorsal first second and plantar first second on the right 09/01/17; patient arrives in clinic today having had a temperature of 103 yesterday. He was seen in the ER and [facility]. The patient was concerned he could have cellulitis again in the right leg however they diagnosed him with a UTI and he is now on Keflex. He has a history of cellulitis which is been recurrent and difficult but this is been in the left leg, in the past 5 use doxycycline. He does in and out catheterizations at home which are risk factors for UTI 09/08/17; patient will be completing his Keflex this weekend. The erythema on the left leg is considerably better. He has a new wound today on the medial part of the right leg small superficial almost looks like a skin tear. He has worsening of the area on the right dorsal first and second toe. His major area on the left lateral leg is better. Using Hydrofera Blue on all areas 09/15/17; gradual reduction in width on the long wound in the left lateral leg. No debridement required. He also  has wounds on the plantar aspect of his left first second toe web space and on the dorsal aspect of the right first second toe web space. 09/22/17; there continues to be very gradual improvements in the dimensions of the left lateral leg wound. He hasn't round erythematous spot with might be pressure on his wheelchair. There is no evidence obviously of infection no purulence no warmth He has a dry scaled area on the plantar aspect of the left first second toe Improved area on the dorsal right first second toe. 09/29/17; left lateral leg wound continues to improve in dimensions mostly with an is still a fairly long but increasingly narrow wound. He has a dry scaled area on the plantar aspect of his left first second toe web space Increasingly concerning area on the dorsal right first second toe. In fact I am concerned today about possible cellulitis around this wound. The areas extending up his second toe and although there is deformities here almost appears to abut on the nailbed. 10/06/17; left lateral leg wound continues to make very gradual progress. Tissue culture I did from the right first second toe dorsal foot last time grew MRSA and enterococcus which was vancomycin sensitive. This was not sensitive to clindamycin or doxycycline. He is allergic to Zyvox and sulfa we have therefore arrange for him to have dalvance infusion tomorrow. He is  had this in the past and tolerated it well 10/20/17; left lateral leg wound continues to make decent progress. This is certainly reduced in terms of with there is advancing epithelialization.The cellulitis in the right foot looks better although he still has a deep wound in the dorsal aspect of the first second toe web space. Plantar left first toe web space on the left I think is making some progress 10/27/17; left lateral leg wound continues to make decent progress. Advancing epithelialization.using Hydrofera Blue The right first second toe web space  wound is better-looking using silver alginate Improvement in the left plantar first second toe web space. Again using silver alginate 11/03/17 left lateral leg wound continues to make decent progress albeit slowly. Using [facility] The right per second toe web space continues to be a very problematic looking punched out wound. I obtained a piece of tissue for deep culture I did extensively treated this for fungus. It is difficult to imagine that this is a pressure area as the patient states other than going outside he doesn't really wear shoes at home The left plantar first second toe web space looked fairly senescent. Necrotic edges. This required debridement change to [facility] Blue to all wound areas 11/10/17; left lateral leg wound continues to contract. Using Hydrofera Blue On the right dorsal first second toe web space dorsally. Culture I did of this area last week grew MRSA there is not an easy oral option in this patient was multiple antibiotic allergies or intolerances. This was only a rare culture isolate I'm therefore going to use Bactroban under silver alginate On the left plantar first second toe web space. Debridement is required here. This is also unchanged 11/17/17; left lateral leg wound continues to contract using Hydrofera Blue this is no longer the major issue. The major concern here is the right first second toe web space. He now has an open area going from dorsally to the plantar aspect. There is now wound on the inner lateral part of the first toe. Not a very viable surface on this. There is erythema spreading medially into the forefoot. No major change in the left first second toe plantar wound 11/24/17; left lateral leg wound continues to contract using Hydrofera Blue. Nice improvement today The right first second toe web space all of this looks a lot less angry than last week. I have given him clindamycin and topical Bactroban for MRSA and terbinafine for the  possibility of underlining tinea pedis that I could not control with ketoconazole. Looks somewhat better The area on the plantar left first second toe web space is weeping with dried debris around the wound 12/01/17; left lateral leg wound continues to contract he Hydrofera Blue. It is becoming thinner in terms of with nevertheless it is making good improvement. The right first second toe web space looks less angry but still a large necrotic-looking wounds starting on the plantar aspect of the right foot extending between the toes and now extensively on the base of the right second toe. I gave him clindamycin and topical Bactroban for MRSA anterior benefiting for the possibility of underlying tinea pedis. Not looking better today The area on the left first/second toe looks better. Debrided of necrotic debris 12/05/17* the patient was worked in urgently today because over the weekend he found blood on his incontinence bad when he woke up. He was found to have an ulcer by his wife who does most of his wound care. He came in today for [place]  to look at this. He has not had a history of wounds in his buttocks in spite of his paraplegia. 12/08/17; seen in follow-up today at his usual appointment. He was seen earlier this week and found to have a new wound on his buttock. We also follow him for wounds on the left lateral leg, left first second toe web space and right first second toe web space 12/15/17; we have been using Hydrofera Blue to the left lateral leg which has improved. The right first second toe web space has also improved. Left first second toe web space plantar aspect looks stable. The left buttock has worsened using Santyl. Apparently the buttock has drainage 12/22/17; we have been using Hydrofera Blue to the left lateral leg which continues to improve now 2 small wounds separated by normal skin. He tells [place] he had a fever up to 100 yesterday he is prone to UTIs but has not noted anything  different. He does in and out catheterizations. The area between the first and second toes today does not look good necrotic surface covered with what looks to be purulent drainage and erythema extending into the third toe. I had gotten this to something that I thought look better last time however it is not look good today. He also has a necrotic surface over the buttock wound which is expanded. I thought there might be infection under here so I removed a lot of the surface with a #5 curet though nothing look like it really needed culturing. He is been using Santyl to this area 12/27/17; his original wound on the left lateral leg continues to improve using Hydrofera Blue. I gave him samples of Baxdella although he was unable to take them out of fear for an allergic reaction ["lump in his throat"].the culture I did of the purulent drainage from his second toe last week showed both enterococcus and a set Enterobacter I was also concerned about the erythema on the bottom of his foot although paradoxically although this looks somewhat better today. 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 We've been using silver alginate here. They came up today that he sleeps in a recliner, I'm not sure why but I asked him to stop this 01/03/18; his original wound we've been using Hydrofera Blue is now separated into 2 areas. Ulcer on his left buttock is better he is off the recliner and sleeping in bed Finally both wound areas between his first and second toes also looks some better 01/10/18; his original wound on the left lateral leg is now separated into 2 wounds we've been using Hydrofera Blue Ulcer on his left buttock has some drainage. There is a small probing site going into muscle layer superiorly.using silver alginate -He arrives today with a deep tissue injury on the left heel The wound on the dorsal aspect of  his first second toe on the left looks a lot betterusing silver alginate ketoconazole The area on the first second toe web space on the right also looks a lot bette 01/17/18; his original wound on the left lateral leg continues to progress using Hydrofera Blue Ulcer on his left buttock also is smaller surface healthier except for a small probing site going into the muscle layer superiorly. 2.4 cm of tunneling in this area DTI on his left heel we have only been offloading. Looks better than last week no threatened open no evidence of infection  the wound on the dorsal aspect of the first second toe on the left continues to look like it's regressing we have only been using silver alginate and terbinafine orally The area in the first second toe web space on the right also looks to be a lot better using silver alginate and terbinafine I think this was prompted by tinea pedis 01/31/18; the patient was hospitalized in [place] last week apparently for a complicated UTI. He was discharged on cefepime he does in and out catheterizations. In the hospital he was discovered M I don't mild elevation of AST and ALTs and the terbinafine was stopped.predictably the pressure ulcer on his buttock looks betterusing silver alginate. The area on the left lateral leg also is better using Hydrofera Blue. The area between the first and second toes on the left better. First and second toes on the right still substantial but better. Finally the DTI on the left heel has held together and looks like it's resolving 02/07/18-he is here in follow-up evaluation for multiple ulcerations. He has new injury to the lateral aspect of the last issue a pressure ulcer, he states this is from adhesive removal trauma. He states he has tried multiple adhesive products with no success. All other ulcers appear stable. The left heel DTI is resolving. We will continue with same treatment plan and follow-up next week. 02/14/18; follow-up for  multiple areas. He has a new area last week on the lateral aspect of his pressure ulcer more over the posterior trochanter. The original pressure ulcer looks quite stable has healthy granulation. 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 Almost fully epithelialized on the original superior area using Hydrofera Blue DTI on the left heel has peeled off this week to reveal a small superficial wound under denuded skin and subcutaneous tissue Both areas between the first and second toes look better including nothing open on the left 02/21/18; The patient's wounds on his left ischial tuberosity and posterior left greater trochanter actually looked better. He has a large area of irritation around the area which I think is contact dermatitis. I am doubtful that this is fungal His original wound on the left lateral calf continues to improve we have been using Hydrofera Blue There is no open area in the left first second toe web space although there is a lot of thick callus The DTI on the left heel required debridement today of necrotic surface eschar and subcutaneous tissue using silver alginate Finally the area on the right first second toe webspace continues to contract using silver alginate and ketoconazole 02/28/18 Left ischial tuberosity wounds look better using silver alginate. Original wound on the left calf only has one small open area left using Hydrofera Blue DTI on the left heel required debridement mostly removing skin from around this wound surface. Using silver alginate The areas on the right first/second toe web space using silver alginate and ketoconazole 03/08/18 on evaluation today patient appears to be doing decently well as best I can tell in regard to his wounds. This is the first time that I have seen him as he generally is followed by Dr. Dellia Nims. With that being said none of  his wounds appear to be infected he does have an area where there is some skin covering what appears to be a new wound on the left dorsal surface of his great toe. This is right at the nail bed.  With that being said I do believe that debrided away some of the excess skin can be of benefit in this regard. Otherwise he has been tolerating the dressing changes without complication. 03/14/18; patient arrives today with the multiplicity of wounds that we are following. He has not been systemically unwell Original wound on the left lateral calf now only has 2 small open areas we've been using Hydrofera Blue which should continue The deep tissue injury on the left heel requires debridement today. We've been using silver alginate The left first second toe and the right first second toe are both are reminiscence what I think was tinea pedis. Apparently some of the callus Surface between the toes was removed last week when it started draining. Purulent drainage coming from the wound on the ischial tuberosity on the left. 03/21/18-He is here in follow-up evaluation for multiple wounds. 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 He tolerated debridement. The only change to last week's recommendations is to discontinue antifungal cream between toes. He will follow-up next week 03/28/18; following up for multiple wounds;Concern this week is streaking redness and swelling in the right foot. He is going to need antibiotics for this. 03/31/18; follow-up for right foot cellulitis. Streaking redness and swelling in the right foot on 03/28/18. 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 I put him on clindamycin 300 mg every 6 and brought him in for a quick check. He has an open wound between his first and second toes on the right foot as a potential source. 04/04/18; Right foot cellulitis is resolving he is completing  clindamycin. This is truly good news Left lateral calf wound which is initial wound only has one small open area inferiorly this is close to healing out. He has compression stockings. We will use Hydrofera Blue right down to the epithelialization of this Nonviable surface on the left heel which was initially pressure with a DTI. We've been using Hydrofera Blue. I'm going to switch this back to silver alginate Left first second toe/tinea pedis this looks better using silver alginate Right first second toe tinea pedis using silver alginate Large pressure ulcers on theLeft ischial tuberosity. Small wound here Looks better. I am uncertain about the surface over the large wound. Using silver alginate 04/11/18; Cellulitis in the right foot is resolved Left lateral calf wound which was his original wounds still has 2 tiny open areas remaining this is just about closed Nonviable surface on the left heel is better but still requires debridement Left first second toe/tinea pedis still open using silver alginate Right first second toe wound tinea pedis I asked him to go back to using ketoconazole and silver alginate Large pressure ulcers on the left ischial tuberosity this shear injury here is resolved. Wound is smaller. No evidence of infection using silver alginate 04/18/18; Patient arrives with an intense area of cellulitis in the right mid lower calf extending into the right heel area. Bright red and warm. Smaller area on the left anterior leg. He has a significant history of MRSA. He will definitely need antibioticsdoxycycline He now has 2 open areas on the left ischial tuberosity the original large wound and now a satellite area which I think was above his initial satellite areas. Not a wonderful surface on this satellite area surrounding erythema which looks like pressure related. His left lateral calf wound again his original wound is just about closed Left heel pressure injury still requiring  debridement Left first second toe looks a lot better using silver alginate Right first second toe also using silver alginate and ketoconazole cream also looks better 04/20/18; the patient was worked in early today out of concerns with his cellulitis on the right leg. I had started him on doxycycline. This was 2 days ago. His wife was concerned about the swelling in the area. Also concerned about the left buttock. He has not been systemically unwell no fever chills. No nausea vomiting or diarrhea 04/25/18; the patient's left buttock wound is continued to deteriorate he is using Hydrofera Blue. He is still completing clindamycin for the cellulitis on the right leg although all of this looks better. 05/02/18 Left buttock wound still with a lot of drainage and a very tightly adherent fibrinous necrotic surface. He has a deeper area superiorly The left lateral calf wound is still closed DTI wound on the left heel necrotic surface especially the circumference using Iodoflex Areas between his left first second toe and right first second toe both look better. Dorsally and the right first second toe he had a necrotic surface although at smaller. In using silver alginate and ketoconazole. I did a culture last week which was a deep tissue culture of the reminiscence of the open wound on the right first second toe dorsally. This grew a few Acinetobacter and a few methicillin-resistant staph aureus. Nevertheless the area actually this week looked better. I didn't feel the need to specifically address this at least in terms of systemic antibiotics. 05/09/18; wounds are measuring larger more drainage per our intake. We are using Santyl covered with alginate on the large superficial buttock wounds, Iodosorb on the left heel, ketoconazole and silver alginate to the dorsal first and second toes bilaterally. 05/16/18; The area on his left buttock better in some aspects although the area superiorly over the ischial  tuberosity required an extensive debridement.using Santyl Left heel appears stable. Using Iodoflex The areas between his first and second toes are not bad however there is spreading erythema up the dorsal aspect of his left foot this looks like cellulitis again. He is insensate the erythema is really very brilliant.o Erysipelas He went to see an allergist days ago because he was itching part of this he had lab work done. This showed a white count of 15.1 with 70% neutrophils. Hemoglobin of 11.4 and a platelet count of 659,000. Last white count we had in Epic was a 2-1/2 years ago which was 25.9 but he was ill at the time. He was able to show me some lab work that was done by his primary physician the pattern is about the same. 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 But prompted me to go ahead and do x-rays of both feet and the pelvis rule out osteomyelitis. He also had a comprehensive metabolic panel this was reasonably normal his albumin was 3.7 liver function tests BUN/creatinine all normal 05/23/18; x-rays of both his feet from last week were negative for underlying pulmonary abnormality. The x-ray of his pelvis however showed mild irregularity in the left ischial which may represent some early osteomyelitis. The wound in the left ischial continues to get deeper clearly now exposed muscle. Each week necrotic surface material over this area. Whereas the rest of the wounds do not look so bad. The left ischial wound we have been using Santyl and calcium alginate To the left heel surface necrotic debris using Iodoflex The left lateral leg  is still healed Areas on the left dorsal foot and the right dorsal foot are about the same. There is some inflammation on the left which might represent contact dermatitis, fungal dermatitis I am doubtful cellulitis although this looks better than last week 05/30/18; CT scan done at Hospital did not show any  osteomyelitis or abscess. Suggested the possibility of underlying cellulitis although I don't see a lot of evidence of this at the bedside The wound itself on the left buttock/upper thigh actually looks somewhat better. No debridement Left heel also looks better no debridement continue Iodoflex Both dorsal first second toe spaces appear better using Lotrisone. Left still required debridement 06/06/18; Intake reported some purulent looking drainage from the left gluteal wound. Using Santyl and calcium alginate Left heel looks better although still a nonviable surface requiring debridement The left dorsal foot first/second webspace actually expanding and somewhat deeper. I may consider doing a shave biopsy of this area Right dorsal foot first/second webspace appears stable to improved. Using Lotrisone and silver alginate to both these areas 06/13/18 Left gluteal surface looks better. Now separated in the 2 wounds. No debridement required. Still drainage. We'll continue silver alginate Left heel continues to look better with Iodoflex continue this for at least another week Of his dorsal foot wounds the area on the left still has some depth although it looks better than last week. We've been using Lotrisone and silver alginate 06/20/18 Left gluteal continues to look better healthy tissue Left heel continues to look better healthy granulation wound is smaller. He is using Iodoflex and his long as this continues continue the Iodoflex Dorsal right foot looks better unfortunately dorsal left foot does not. There is swelling and erythema of his forefoot. He had minor trauma to this several days ago but doesn't think this was enough to have caused any tissue injury. Foot looks like cellulitis, we have had this problem before 06/27/18 on evaluation today patient appears to be doing a little worse in regard to his foot ulcer. 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 Both of which would really be his only options for treating this infection. In the past he has been given and effusion of Orbactiv. This is done very well for him in the past again it's one time dosing IV antibiotic therapy. Subsequently I do believe this is something we're gonna need to see about doing at this point in time. Currently his other wounds seem to be doing somewhat better in my pinion I'm pretty happy in that regard. 07/03/18 on evaluation today patient's wounds actually appear to be doing fairly well. He has been tolerating the dressing changes without complication. All in all he seems to be showing signs of improvement. In regard to the antibiotics he has been dealing with infectious disease since I saw him last week as far as getting this scheduled. In the end [facility]'s [facility] this coming Friday. In the meantime he has been continuing to perform the dressing changes in such as previous. There does not appear to be any evidence of infection worsengin at this time. 07/10/18; Since I last saw this man 2 weeks ago things have actually improved. IV antibiotics of resulted in less forefoot erythema although there is still some present. He is not systemically unwell Left buttock  wounds 2 now have no depth there is increased epithelialization Using silver alginate Left heel still requires debridement using Iodoflex Left dorsal foot still with a sizable wound about the size of a border but healthy granulation Right dorsal foot still with a slitlike area using silver alginate 07/18/18; the patient's cellulitis in the left foot is improved in fact I think it is on its way to resolving. Left buttock wounds 2 both look better although the larger one has hypertension granulation we've been using silver alginate Left heel has some thick  circumferential redundant skin over the wound edge which will need to be removed today we've been using Iodoflex Left dorsal foot is still a sizable wound required debridement using silver alginate The right dorsal foot is just about closed only a small open area remains here 07/25/18; left foot cellulitis is resolved Left buttock wounds 2 both look better. Hyper-granulation on the major area Left heel as some debris over the surface but otherwise looks a healthier wound. Using silver collagen Right dorsal foot is just about closed 07/31/18; arrives with our intake nurse worried about purulent drainage from the buttock. We had hyper-granulation here last week His buttock wounds 2 continue to look better Left heel some debris over the surface but measuring smaller. Right dorsal foot unfortunately has openings between the toes Left foot superficial wound looks less aggravated. 08/07/18 Buttock wounds continue to look better although some of her granulation and the larger medial wound. silver alginate Left heel continues to look a lot better.silver collagen Left foot superficial wound looks less stable. Requires debridement. He has a new wound superficial area on the foot on the lateral dorsal foot. Right foot looks better using silver alginate without Lotrisone 08/14/2018; patient was in the ER last week diagnosed with a UTI. He is now on Cefpodoxime and Macrodantin. Buttock wounds continued to be smaller. Using silver alginate Left heel continues to look better using silver collagen Left foot superficial wound looks as though it is improving Right dorsal foot area is just about healed. 08/21/2018; patient is completed his antibiotics for his UTI. He has 2 open areas on the buttocks. There is still not closed although the surface looks satisfactory. Using silver alginate Left heel continues to improve using silver collagen The bilateral dorsal foot areas which are at the base of his first and  second toes/possible tinea pedis are actually stable on the left but worse on the right. The area on the left required debridement of necrotic surface. After debridement I obtained a specimen for PCR culture. The right dorsal foot which is been just about healed last week is now reopened 08/28/2018; culture done on the left dorsal foot showed coag negative staph both staph epidermidis and Lugdunensis. I think this is worthwhile initiating systemic treatment. I will use doxycycline given his long list of allergies. The area on the left heel slightly improved but still requiring debridement. The large wound on the buttock is just about closed whereas the smaller one is larger. Using silver alginate in this area 09/04/2018; patient is completing his doxycycline for the left foot although this continues to be a very difficult wound area with very adherent necrotic debris. We are using silver alginate to all his wounds right foot left foot and the small wounds on his buttock, silver collagen on the left heel. 09/11/2018; once again this patient has intense erythema and swelling of the left forefoot. Lesser degrees of erythema in the right foot. He has a long list  of allergies and intolerances. I will reinstitute doxycycline. 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 Using silver alginate Left heel also looks better using silver collagen Unfortunately both the areas on his feet look worse. The area on the left first second webspace is now gone through to the plantar part of his foot. The area on the left foot anteriorly is irritated with erythema and swelling in the forefoot. 09/25/2018 His wound on the left plantar heel looks better. Using silver collagen The area on the left buttock 2 small remnant areas. One is closed one is still open. Using silver alginate The areas between both his first and second toes look worse. This in spite of long-standing  antifungal therapy with ketoconazole and silver alginate which should have antifungal activity He has small areas around his original wound on the left calf one is on the bottom of the original scar tissue and one superiorly both of these are small and superficial but again given wound history in this site this is worrisome 10/02/2018 Left plantar heel continues to gradually contract using silver collagen Left buttock wound is unchanged using silver alginate The areas on his dorsal feet between his first and second toes bilaterally look about the same. I prescribed clindamycin ointment to see if we can address chronic staph colonization and also the underlying possibility of erythrasma The left lateral lower extremity wound is actually on the lateral part of his ankle. Small open area here. We have been using silver alginate 10/09/2018; Left plantar heel continues to look healthy and contract. No debridement is required Left buttock slightly smaller with a tape injury wound just below which was new this week Dorsal feet somewhat improved I have been using clindamycin Left lateral looks lower extremity the actual open area looks worse although a lot of this is epithelialized. I am going to change to silver collagen today He has a lot more swelling in the right leg although this is not pitting not red and not particularly warm there is a lot of spasm in the right leg usually indicative of people with paralysis of some underlying discomfort. We have reviewed his vascular status from 2017 he had a left greater saphenous vein ablation. I wonder about referring him back to vascular surgery if the area on the left leg continues to deteriorate. 10/16/2018 in today for follow-up and management of multiple lower extremity ulcers. His left Buttock wound is much lower smaller and almost closed completely. The wound to the left ankle has began to reopen with Epithelialization and some adherent slough. He  has multiple new areas to the left foot and leg. The left dorsal foot without much improvement. Wound present between left great webspace and 2nd toe. Erythema and edema present right leg. Right LE ultrasound obtained on 10/10/18 was negative for DVT. 10/23/2018; Left buttock is closed over. Still dry macerated skin but there is no open wound. I suspect this is chronic pressure/moisture Left lateral calf is quite a bit worse than when I saw this last. There is clearly drainage here he has macerated skin into the left plantar heel. We will change the primary dressing to alginate Left dorsal foot has some improvement in overall wound area. Still using clindamycin and silver alginate Right dorsal foot about the same as the left using clindamycin and silver alginate The erythema in the right leg has resolved. He is DVT rule out was negative Left heel pressure area  required debridement although the wound is smaller and the surface is health 10/26/2018 The patient came back in for his nurse check today predominantly because of the drainage coming out of the left lateral leg with a recent reopening of his original wound on the left lateral calf. He comes in today with a large amount of surrounding erythema around the wound extending from the calf into the ankle and even in the area on the dorsal foot. He is not systemically unwell. He is not febrile. Nevertheless this looks like cellulitis. We have been using silver alginate to the area. I changed him to a regular visit and I am going to prescribe him doxycycline. The rationale here is a long list of medication intolerances and a history of MRSA. I did not see anything that I thought would provide a valuable culture 10/30/2018 Follow-up from his appointment 4 days ago with really an extensive area of cellulitis in the left calf left lateral ankle and left dorsal foot. I put him on doxycycline. He has a long list of medication allergies which are true  allergy reactions. 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 In any case he is a lot better today. The erythema especially in the anterior and lateral left calf is better. He still has left ankle erythema. He also is complaining about increasing edema in the right leg we have only been using Kerlix Coban and he has been doing the wraps at home. Finally he has a spotty rash on the medial part of his upper left calf which looks like folliculitis or perhaps wrap occlusion type injury. Small superficial macules not pustules 11/06/18 patient arrives today with again a considerable degree of erythema around the wound on the left lateral calf extending into the dorsal ankle and dorsal foot. This is a lot worse than when I saw this last week. He is on doxycycline really with not a lot of improvement. He has not been systemically unwell Wounds on the; left heel actually looks improved. Original area on the left foot and proximity to the first and second toes looks about the same. He has superficial areas on the dorsal foot, anterior calf and then the reopening of his original wound on the left lateral calf which looks about the same The only area he has on the right is the dorsal webspace first and second which is smaller. He has a large area of dry erythematous skin on the left buttock small open area here. 11/13/2018; the patient arrives in much better condition. The erythema around the wound on the left lateral calf is a lot better. Not sure whether this was the clindamycin or the TCA and ketoconazole or just in the improvement in edema control [stasis dermatitis]. In any case this is a lot better. The area on the left heel is very small and just about resolved using silver collagen we have been using silver alginate to the areas on his dorsal feet 11/20/2018; his wounds include the left lateral calf, left heel, dorsal aspects of  both feet just proximal to the first second webspace. He is stable to slightly improved. I did not think any changes to his dressings were going to be necessary 11/27/2018 he has a reopening on the left buttock which is surrounded by what looks like tinea or perhaps some other form of dermatitis. The area on the left dorsal foot has some erythema around it I have marked this  area but I am not sure whether this is cellulitis or not. Left heel is not closed. Left calf the reopening is really slightly longer and probably worse 1/13; in general things look better and smaller except for the left dorsal foot. Area on the left heel is just about closed, left buttock looks better only a small wound remains in the skin looks better [using Lotrisone] 1/20; the area on the left heel only has a few remaining open areas here. Left lateral calf about the same in terms of size, left dorsal foot slightly larger right lateral foot still not closed. The area on the left buttock has no open wound and the surrounding skin looks a lot better 1/27; the area on the left heel is closed. Left lateral calf better but still requiring extensive debridements. The area on his left buttock is closed. He still has the open areas on the left dorsal foot which is slightly smaller in the right foot which is slightly expanded. We have been using Iodoflex on these areas as well 2/3; left heel is closed. Left lateral calf still requiring debridement using Iodoflex there is no open area on his left buttock however he has dry scaly skin over a large area of this. Not really responding well to the Lotrisone. Finally the areas on his dorsal feet at the level of the first second webspace are slightly smaller on the right and about the same on the left. Both of these vigorously debrided with Anasept and gauze 2/10; left heel remains closed he has dry erythematous skin over the left buttock but there is no open wound here. Left lateral leg  has come in and with. Still requiring debridement we have been using Iodoflex here. Finally the area on the left dorsal foot and right dorsal foot are really about the same extremely dry callused fissured areas. He does not yet have a dermatology appointment 2/17; left heel remains closed. He has a new open area on the left buttock. The area on the left lateral calf is bigger longer and still covered in necrotic debris. No major change in his foot areas bilaterally. I am awaiting for a dermatologist to look on this. We have been using ketoconazole I do not know that this is been doing any good at all. 2/24; left heel remains closed. The left buttock wound that was new reopening last week looks better. The left lateral calf appears better also although still requires debridement. The major area on his foot is the left first second also requiring debridement. We have been putting Prisma on all wounds. I do not believe that the ketoconazole has done too much good for his feet. He will use Lotrisone I am going to give him a 2-week course of terbinafine. We still do not have a dermatology appointment 3/2 left heel remains closed however there is skin over bone in this area I pointed this out to him today. The left buttock wound is epithelialized but still does not look completely stable. The area on the left leg required debridement were using silver collagen here. With regards to his feet we changed to Lotrisone last week and silver alginate. 3/9; left heel remains closed. Left buttock remains closed. The area on the right foot is essentially closed. The left foot remains unchanged. Slightly smaller on the left lateral calf. Using silver collagen to both of these areas 3/16-Left heel remains closed. Area on right foot is closed. Left lateral calf above the lateral malleolus open wound  requiring debridement with easy bleeding. Left dorsal wound proximal to first toe also debrided. Left ischial  area open new. Patient has been using Prisma with wrapping every 3 days. Dermatology appointment is apparently tomorrow.Patient has completed his terbinafine 2-week course with some apparent improvement according to him, there is still flaking and dry skin in his foot on the left 3/23; area on the right foot is reopened. The area on the left anterior foot is about the same still a very necrotic adherent surface. He still has the area on the left leg and reopening is on the left buttock. He apparently saw dermatology although I do not have a note. According to the patient who is usually fairly well informed they did not have any good ideas. Put him on oral terbinafine which she is been on before. 3/30; using silver collagen to all wounds. Apparently his dermatologist put him on doxycycline and rifampin presumably some culture grew staph. I do not have this result. He remains on terbinafine although I have used terbinafine on him before 4/6; patient has had a fairly substantial reopening on the right foot between the first and second toes. He is finished his terbinafine and I believe is on doxycycline and rifampin still as prescribed by dermatology. We have been using silver collagen to all his wounds although the patient reports that he thinks silver alginate does better on the wounds on his buttock. 4/13; the area on his left lateral calf about the same size but it did not require debridement. Left dorsal foot just proximal to the webspace between the first and second toes is about the same. Still nonviable surface. I note some superficial bronze discoloration of the dorsal part of his foot Right dorsal foot just proximal to the first and second toes also looks about the same. I still think there may be the same discoloration I noted above on the left Left buttock wound looks about the same 4/20; left lateral calf appears to be gradually contracting using silver collagen. He remains on  erythromycin empiric treatment for possible erythrasma involving his digital spaces. The left dorsal foot wound is debrided of tightly adherent necrotic debris and really cleans up quite nicely. The right area is worse with expansion. I did not debride this it is now over the base of the second toe The area on his left buttock is smaller no debridement is required using silver collagen 5/4; left calf continues to make good progress. He arrives with erythema around the wounds on his dorsal foot which even extends to the plantar aspect. Very concerning for coexistent infection. He is finished the erythromycin I gave him for possible erythrasma this does not seem to have helped. The area on the left foot is about the same base of the dorsal toes Is area on the buttock looks improved on the left 5/11; left calf and left buttock continued to make good progress. Left foot is about the same to slightly improved. Major problem is on the right foot. He has not had an x-ray. Deep tissue culture I did last week showed both Enterobacter and E. coli. I did not change the doxycycline I put him on empirically although neither 1 of these were plated to doxycycline. He arrives today with the erythema looking worse on both the dorsal and plantar foot. Macerated skin on the bottom of the foot. he has not been systemically unwell 5/18-Patient returns at 1 week, left calf wound appears to be making some progress, left buttock wound  appears slightly worse than last time, left foot wound looks slightly better, right foot redness is marginally better. X-ray of both feet show no air or evidence of osteomyelitis. Patient is finished his Omnicef and terbinafine. He continues to have macerated skin on the bottom of the left foot as well as right 5/26; left calf wound is better, left buttock wound appears to have multiple small superficial open areas with surrounding macerated skin. X-rays that I did last time showed no  evidence of osteomyelitis in either foot. He is finished cefdinir and doxycycline. I do not think that he was on terbinafine. He continues to have a large superficial open area on the right foot anterior dorsal and slightly between the first and second toes. I did send him to dermatology 2 months ago or so wondering about whether they would do a fungal scraping. I do not believe they did but did do a culture. We have been using silver alginate to the toe areas, he has been using antifungals at home topically either ketoconazole or Lotrisone. We are using silver collagen on the left foot, silver alginate on the right, silver collagen on the left lateral leg and silver alginate on the left buttock 6/1; left buttock area is healed. We have the left dorsal foot, left lateral leg and right dorsal foot. We are using silver alginate to the areas on both feet and silver collagen to the area on his left lateral calf 6/8; the left buttock apparently reopened late last week. He is not really sure how this happened. He is tolerating the terbinafine. Using silver alginate to all wounds 6/15; left buttock wound is larger than last week but still superficial. Came in the clinic today with a report of purulence from the left lateral leg I did not identify any infection Both areas on his dorsal feet appear to be better. He is tolerating the terbinafine. Using silver alginate to all wounds 6/22; left buttock is about the same this week, left calf quite a bit better. His left foot is about the same however he comes in with erythema and warmth in the right forefoot once again. Culture that I gave him in the beginning of May showed Enterobacter and E. coli. I gave him doxycycline and things seem to improve although neither 1 of these organisms was specifically plated. 6/29; left buttock is larger and dry this week. Left lateral calf looks to me to be improved. Left dorsal foot also somewhat improved right foot  completely unchanged. The erythema on the right foot is still present. He is completing the Ceftin dinner that I gave him empirically [see discussion above.) 7/6 - All wounds look to be stable and perhaps improved, the left buttock wound is slightly smaller, per patient bleeds easily, completed ceftin, the right foot redness is less, he is on terbinafine 7/13; left buttock wound about the same perhaps slightly narrower. Area on the left lateral leg continues to narrow. Left dorsal foot slightly smaller right foot about the same. We are using silver alginate on the right foot and Hydrofera Blue to the areas on the left. Unna boot on the left 2 layer compression on the right 7/20; left buttock wound absolutely the same. Area on lateral leg continues to get better. Left dorsal foot require debridement as did the right no major change in the 7/27; left buttock wound the same size necrotic debris over the surface. The area on the lateral leg is closed once again. His left foot looks better  right foot about the same although there is some involvement now of the posterior first second toe area. He is still on terbinafine which I have given him for a month, not certain a centimeter major change 06/25/19-All wounds appear to be slightly improved according to report, left buttock wound looks clean, both foot wounds have minimal to no debris the right dorsal foot has minimal slough. We are using Hydrofera Blue to the left and silver alginate to the right foot and ischial wound. 8/10-Wounds all appear to be around the same, the right forefoot distal part has some redness which was not there before, however the wound looks clean and small. Ischial wound looks about the same with no changes 8/17; his wound on the left lateral calf which was his original chronic venous insufficiency wound remains closed. Since I last saw him the areas on the left dorsal foot right dorsal foot generally appear better but  require debridement. The area on his left initial tuberosity appears somewhat larger to me perhaps hyper granulated and bleeds very easily. We have been using Hydrofera Blue to the left dorsal foot and silver alginate to everything else 8/24; left lateral calf remains closed. The areas on his dorsal feet on the webspace of the first and second toes bilaterally both look better. The area on the left buttock which is the pressure ulcer stage II slightly smaller. I change the dressing to Hydrofera Blue to all areas 8/31; left lateral calf remains closed. The area on his dorsal feet bilaterally look better. Using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 Using Santyl. The area on  the left toes is healed and I think the area on his right dorsal foot is healed as well. There is no cellulitis or venous inflammation involving the right leg. He is going to need compression stockings here. 10/07/16; the patient's extensive wound on the left lateral calf  and ankle does not measure any differently however there appears to be less adherent surface slough using Santyl and aggressive weekly debridements 10/21/16; no major change in the area on the left lateral calf. Still the same measurement still very difficult to debridement adherent slough and nonviable subcutaneous tissue. This is not really been helped by several weeks of Santyl. Previously for 2 weeks I used Iodoflex for a short period. A prolonged course of Hydrofera Blue didn't really help. I'm not sure why I only used 2 weeks of Iodoflex on this there is no evidence of surrounding infection. He has a small area on the right second toe which looks as though it's progressing towards closure 10/28/16; the wounds on his toes appear to be closed. No major change in the left lateral leg wound although the surface looks somewhat better using Iodoflex. He has had previous arterial studies that were normal. He has had reflux studies and is status post ablation although I don't have any exact notes on which vein was ablated. I'll need to check the surgical record 11/04/16; he's had a reopening between the first and second toe on the left and right. No major change in the left lateral leg wound. There is what appears to be cellulitis of the left dorsal foot 11/18/16 the patient was hospitalized initially in [place] and then subsequently transferred to [facility] long and was admitted there from 11/09/16 through 11/12/16. He had developed progressive cellulitis on the right leg in spite of the doxycycline I gave him. I'd spoken to the hospitalist in [place] who was concerned about continuing leukocytosis. CT scan is what I suggested this was done which showed soft tissue swelling without evidence of osteomyelitis or an underlying abscess blood cultures were negative. At [facility] he was treated with vancomycin and Primaxin and then add an infectious disease consult. He was transitioned to Ceftaroline. He  has been making progressive improvement. Overall a severe cellulitis of the right leg. He is been using silver alginate to her original wound on the left leg. The wounds in his toes on the right are closed there is a small open area on the base of the left second toe 11/26/15; the patient's right leg is much better although there is still some edema here this could be reminiscent from his severe cellulitis likely on top of some degree of lymphedema. His left anterior leg wound has less surface slough as reported by her intake nurse. Small wound at the base of the left second toe 12/02/16; patient's right leg is better and there is no open wound here. His left anterior lateral leg wound continues to have a healthy-looking surface. Small wound at the base of the left second toe however there is erythema in the left forefoot which is worrisome 12/16/16; is no open wounds on his right leg. We took measurements for stockings. His left anterior lateral leg wound continues to have a healthy-looking surface. I'm not sure where we were with the Apligraf run through his insurance. We have been using Iodoflex. He has a thick eschar on the left first second toe interface, I suspect this may be fungal however there is no visible open 12/23/16; no open wound on his right  leg. He has 2 small areas left of the linear wound that was remaining last week. We have been using Prisma, I thought I have disclosed this week, we can only look forward to next week 01/03/17; the patient had concerning areas of erythema last week, already on doxycycline for UTI through his primary doctor. The erythema is absolutely no better there is warmth and swelling both medially from the left lateral leg wound and also the dorsal left foot. 01/06/17- Patient is here for follow-up evaluation of his left lateral leg ulcer and bilateral feet ulcers. He is on oral antibiotic therapy, tolerating that. Nursing staff and the patient states that the  erythema is improved from Monday. 01/13/17; the predominant left lateral leg wound continues to be problematic. I had put Apligraf on him earlier this month once. However he subsequently developed what appeared to be an intense cellulitis around the left lateral leg wound. I gave him Dalvance I think on 2/12 perhaps 2/13 he continues on cefdinir. The erythema is still present but the warmth and swelling is improved. I am hopeful that the cellulitis part of this control. I wouldn't be surprised if there is an element of venous inflammation as well. 01/17/17. The erythema is present but better in the left leg. His left lateral leg wound still does not have a viable surface buttons certain parts of this long thin wound it appears like there has been improvement in dimensions. 01/20/17; the erythema still present but much better in the left leg. I'm thinking this is his usual degree of chronic venous inflammation. The wound on the left leg looks somewhat better. Is less surface slough 01/27/17; erythema is back to the chronic venous inflammation. The wound on the left leg is somewhat better. I am back to the point where I like to try an Apligraf once again 02/10/17; slight improvement in wound dimensions. Apligraf #2. He is completing his doxycycline 02/14/17; patient arrives today having completed doxycycline last Thursday. This was supposed to be a nurse visit however once again he hasn't tense erythema from the medial part of his wound extending over the lower leg. Also erythema in his foot this is roughly in the same distribution as last time. He has baseline chronic venous inflammation however this is a lot worse than the baseline I have learned to accept the on him is baseline inflammation 02/24/17- patient is here for follow-up evaluation. He is tolerating compression therapy. His voicing no complaints or concerns he is here anticipating an Apligraf 03/03/17; he arrives today with an adherent necrotic  surface. I don't think this is surface is going to be amenable for Apligraf's. The erythema around his wound and on the left dorsal foot has resolved he is off antibiotics 03/10/17; better-looking surface today. I don't think he can tolerate Apligraf's. He tells me he had a wound VAC after a skin graft years ago to this area and they had difficulty with a seal. The erythema continues to be stable around this some degree of chronic venous inflammation but he also has recurrent cellulitis. We have been using Iodoflex 03/17/17; continued improvement in the surface and may be small changes in dimensions. Using Iodoflex which seems the only thing that will control his surface 03/24/17- He is here for follow up evaluation of his LLE lateral ulceration and ulcer to right dorsal foot/toe space. He is voicing no complaints or concerns, He is tolerating compression wrap. 03/31/17 arrives today with a much healthier looking wound on the left lower  extremity. We have been using Iodoflex for a prolonged period of time which has for the first time prepared and adequate looking wound bed although we have not had much in the way of wound dimension improvement. He also has a small wound between the first and second toe on the right 04/07/17; arrives today with a healthy-looking wound bed and at least the top 50% of this wound appears to be now her. No debridement was required I have changed him to [facility] last week after prolonged Iodoflex. He did not do well with Apligraf's. We've had a re-opening between the first and second toe on the right 04/14/17; arrives today with a healthier looking wound bed contractions and the top 50% of this wound and some on the lesser 50%. Wound bed appears healthy. The area between the first and second toe on the right still remains problematic 04/21/17; continued very gradual improvement. Using [facility] 04/28/17; continued very gradual improvement in the left lateral leg  venous insufficiency wound. His periwound erythema is very mild. We have been using Hydrofera Blue. Wound is making progress especially in the superior 50% 05/05/17; he continues to have very gradual improvement in the left lateral venous insufficiency wound. Both in terms with an length rings are improving. I debrided this every 2 weeks with #5 curet and we have been using Hydrofera Blue and again making good progress With regards to the wounds between his right first and second toe which I thought might of been tinea pedis he is not making as much progress very dry scaly skin over the area. Also the area at the base of the left first and second toe in a similar condition 05/12/17; continued gradual improvement in the refractory left lateral venous insufficiency wound on the left. Dimension smaller. Surface still requiring debridement using Hydrofera Blue 05/19/17; continued gradual improvement in the refractory left lateral venous ulceration. Careful inspection of the wound bed underlying rumination suggested some degree of epithelialization over the surface no debridement indicated. Continue Hydrofera Blue difficult areas between his toes first and third on the left than first and second on the right. I'm going to change to silver alginate from silver collagen. Continue ketoconazole as I suspect underlying tinea pedis 05/26/17; left lateral leg venous insufficiency wound. We've been using Hydrofera Blue. I believe that there is expanding epithelialization over the surface of the wound albeit not coming from the wound circumference. This is a bit of an odd situation in which the epithelialization seems to be coming from the surface of the wound rather than in the exact circumference. There is still small open areas mostly along the lateral margin of the wound. ooHe has unchanged areas between the left first and second and the right first second toes which I been treating for tenia pedis 06/02/17;  left lateral leg venous insufficiency wound. We have been using Hydrofera Blue. Somewhat smaller from the wound circumference. The surface of the wound remains a bit on it almost epithelialized sedation in appearance. I use an open curette today debridement in the surface of all of this especially the edges ooSmall open wounds remaining on the dorsal right first and second toe interspace and the plantar left first second toe and her face on the left 06/09/17; wound on the left lateral leg continues to be smaller but very gradual and very dry surface using Hydrofera Blue 06/16/17 requires weekly debridements now on the left lateral leg although this continues to contract. I changed to silver collagen last  week because of dryness of the wound bed. Using Iodoflex to the areas on his first and second toes/web space bilaterally 06/24/17; patient with history of paraplegia also chronic venous insufficiency with lymphedema. Has a very difficult wound on the left lateral leg. This has been gradually reducing in terms of with but comes in with a very dry adherent surface. High switch to silver collagen a week or so ago with hydrogel to keep the area moist. This is been refractory to multiple dressing attempts. He also has areas in his first and second toes bilaterally in the anterior and posterior web space. I had been using Iodoflex here after a prolonged course of silver alginate with ketoconazole was ineffective [question tinea pedis] 07/14/17; patient arrives today with a very difficult adherent material over his left lateral lower leg wound. He also has surrounding erythema and poorly controlled edema. He was switched his Santyl last visit which the nurses are applying once during his doctor visit and once on a nurse visit. He was also reduced to 2 layer compression I'm not exactly sure of the issue here. 07/21/17; better surface today after 1 week of Iodoflex. Significant cellulitis that we treated last  week also better. [Doxycycline] 07/28/17 better surface today with now 2 weeks of Iodoflex. Significant cellulitis treated with doxycycline. He has now completed the doxycycline and he is back to his usual degree of chronic venous inflammation/stasis dermatitis. He reminds me he has had ablations surgery here 08/04/17; continued improvement with Iodoflex to the left lateral leg wound in terms of the surface of the wound although the dimensions are better. 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 Problematic areas on the plantar aspect of the first second toe web space on the left and the dorsal aspect of the first second toe web space on the right. At one point I felt these were probably related to chronic fungal infections in treated him aggressively for this although we have not made any improvement here. 08/11/17; left lateral leg. Surface continues to improve with the Iodoflex although we are not seeing much improvement in overall wound dimensions. Areas on his plantar left foot and right foot show no improvement. In fact the right foot looks somewhat worse 08/18/17; left lateral leg. We changed to [facility] Blue last week after a prolonged course of Iodoflex which helps get the surface better. It appears that the wound with is improved. Continue with difficult areas on the left dorsal first second and plantar first second on the right 09/01/17; patient arrives in clinic today having had a temperature of 103 yesterday. He was seen in the ER and [facility]. The patient was concerned he could have cellulitis again in the right leg however they diagnosed him with a UTI and he is now on Keflex. He has a history of cellulitis which is been recurrent and difficult but this is been in the left leg, in the past 5 use doxycycline. He does in and out catheterizations at home which are risk factors for UTI 09/08/17; patient will be  completing his Keflex this weekend. The erythema on the left leg is considerably better. He has a new wound today on the medial part of the right leg small superficial almost looks like a skin tear. He has worsening of the area on the right dorsal first and second toe. His major area on the left lateral leg is better. Using Hydrofera Blue on all areas 09/15/17; gradual  reduction in width on the long wound in the left lateral leg. No debridement required. He also has wounds on the plantar aspect of his left first second toe web space and on the dorsal aspect of the right first second toe web space. 09/22/17; there continues to be very gradual improvements in the dimensions of the left lateral leg wound. He hasn't round erythematous spot with might be pressure on his wheelchair. There is no evidence obviously of infection no purulence no warmth ooHe has a dry scaled area on the plantar aspect of the left first second toe ooImproved area on the dorsal right first second toe. 09/29/17; left lateral leg wound continues to improve in dimensions mostly with an is still a fairly long but increasingly narrow wound. ooHe has a dry scaled area on the plantar aspect of his left first second toe web space ooIncreasingly concerning area on the dorsal right first second toe. In fact I am concerned today about possible cellulitis around this wound. The areas extending up his second toe and although there is deformities here almost appears to abut on the nailbed. 10/06/17; left lateral leg wound continues to make very gradual progress. Tissue culture I did from the right first second toe dorsal foot last time grew MRSA and enterococcus which was vancomycin sensitive. This was not sensitive to clindamycin or doxycycline. He is allergic to Zyvox and sulfa we have therefore arrange for him to have dalvance infusion tomorrow. He is had this in the past and tolerated it well 10/20/17; left lateral leg wound  continues to make decent progress. This is certainly reduced in terms of with there is advancing epithelialization.ooThe cellulitis in the right foot looks better although he still has a deep wound in the dorsal aspect of the first second toe web space. Plantar left first toe web space on the left I think is making some progress 10/27/17; left lateral leg wound continues to make decent progress. Advancing epithelialization.using Hydrofera Blue ooThe right first second toe web space wound is better-looking using silver alginate ooImprovement in the left plantar first second toe web space. Again using silver alginate 11/03/17 left lateral leg wound continues to make decent progress albeit slowly. Using Hydrofera Blue ooThe right per second toe web space continues to be a very problematic looking punched out wound. I obtained a piece of tissue for deep culture I did extensively treated this for fungus. It is difficult to imagine that this is a pressure area as the patient states other than going outside he doesn't really wear shoes at home ooThe left plantar first second toe web space looked fairly senescent. Necrotic edges. This required debridement oochange to Hydrofera Blue to all wound areas 11/10/17; left lateral leg wound continues to contract. Using Hydrofera Blue ooOn the right dorsal first second toe web space dorsally. Culture I did of this area last week grew MRSA there is not an easy oral option in this patient was multiple antibiotic allergies or intolerances. This was only a rare culture isolate I'm therefore going to use Bactroban under silver alginate ooOn the left plantar first second toe web space. Debridement is required here. This is also unchanged 11/17/17; left lateral leg wound continues to contract using Hydrofera Blue this is no longer the major issue. ooThe major concern here is the right first second toe web space. He now has an open area going from dorsally to the  plantar aspect. There is now wound on the inner lateral part of the first toe.  Not a very viable surface on this. There is erythema spreading medially into the forefoot. ooNo major change in the left first second toe plantar wound 11/24/17; left lateral leg wound continues to contract using Hydrofera Blue. Nice improvement today ooThe right first second toe web space all of this looks a lot less angry than last week. I have given him clindamycin and topical Bactroban for MRSA and terbinafine for the possibility of underlining tinea pedis that I could not control with ketoconazole. Looks somewhat better ooThe area on the plantar left first second toe web space is weeping with dried debris around the wound 12/01/17; left lateral leg wound continues to contract he Hydrofera Blue. It is becoming thinner in terms of with nevertheless it is making good improvement. ooThe right first second toe web space looks less angry but still a large necrotic-looking wounds starting on the plantar aspect of the right foot extending between the toes and now extensively on the base of the right second toe. I gave him clindamycin and topical Bactroban for MRSA anterior benefiting for the possibility of underlying tinea pedis. Not looking better today ooThe area on the left first/second toe looks better. Debrided of necrotic debris 12/05/17* the patient was worked in urgently today because over the weekend he found blood on his incontinence bad when he woke up. He was found to have an ulcer by his wife who does most of his wound care. He came in today for [place] to look at this. He has not had a history of wounds in his buttocks in spite of his paraplegia. 12/08/17; seen in follow-up today at his usual appointment. He was seen earlier this week and found to have a new wound on his buttock. We also follow him for wounds on the left lateral leg, left first second toe web space and right first second toe web space 12/15/17;  we have been using Hydrofera Blue to the left lateral leg which has improved. The right first second toe web space has also improved. Left first second toe web space plantar aspect looks stable. The left buttock has worsened using Santyl. Apparently the buttock has drainage 12/22/17; we have been using Hydrofera Blue to the left lateral leg which continues to improve now 2 small wounds separated by normal skin. He tells [place] he had a fever up to 100 yesterday he is prone to UTIs but has not noted anything different. He does in and out catheterizations. The area between the first and second toes today does not look good necrotic surface covered with what looks to be purulent drainage and erythema extending into the third toe. I had gotten this to something that I thought look better last time however it is not look good today. He also has a necrotic surface over the buttock wound which is expanded. I thought there might be infection under here so I removed a lot of the surface with a #5 curet though nothing look like it really needed culturing. He is been using Santyl to this area 12/27/17; his original wound on the left lateral leg continues to improve using Hydrofera Blue. I gave him samples of Baxdella although he was unable to take them out of fear for an allergic reaction ["lump in his throat"].the culture I did of the purulent drainage from his second toe last week showed both enterococcus and a set Enterobacter I was also concerned about the erythema on the bottom of his foot although paradoxically although this looks somewhat better today.  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 We've been using silver alginate here. They came up today that he sleeps in a recliner, I'm not sure why but I asked him to stop this 01/03/18; his original wound we've been using Hydrofera Blue is now separated into 2  areas. ooUlcer on his left buttock is better he is off the recliner and sleeping in bed ooFinally both wound areas between his first and second toes also looks some better 01/10/18; his original wound on the left lateral leg is now separated into 2 wounds we've been using Hydrofera Blue ooUlcer on his left buttock has some drainage. There is a small probing site going into muscle layer superiorly.using silver alginate -He arrives today with a deep tissue injury on the left heel ooThe wound on the dorsal aspect of his first second toe on the left looks a lot betterusing silver alginate ketoconazole ooThe area on the first second toe web space on the right also looks a lot bette 01/17/18; his original wound on the left lateral leg continues to progress using Hydrofera Blue ooUlcer on his left buttock also is smaller surface healthier except for a small probing site going into the muscle layer superiorly. 2.4 cm of tunneling in this area ooDTI on his left heel we have only been offloading. Looks better than last week no threatened open no evidence of infection oothe wound on the dorsal aspect of the first second toe on the left continues to look like it's regressing we have only been using silver alginate and terbinafine orally ooThe area in the first second toe web space on the right also looks to be a lot better using silver alginate and terbinafine I think this was prompted by tinea pedis 01/31/18; the patient was hospitalized in [place] last week apparently for a complicated UTI. He was discharged on cefepime he does in and out catheterizations. In the hospital he was discovered M I don't mild elevation of AST and ALTs and the terbinafine was stopped.predictably the pressure ulcer on his buttock looks betterusing silver alginate. The area on the left lateral leg also is better using Hydrofera Blue. The area between the first and second toes on the left better. First and second toes on  the right still substantial but better. Finally the DTI on the left heel has held together and looks like it's resolving 02/07/18-he is here in follow-up evaluation for multiple ulcerations. He has new injury to the lateral aspect of the last issue a pressure ulcer, he states this is from adhesive removal trauma. He states he has tried multiple adhesive products with no success. All other ulcers appear stable. The left heel DTI is resolving. We will continue with same treatment plan and follow-up next week. 02/14/18; follow-up for multiple areas. ooHe has a new area last week on the lateral aspect of his pressure ulcer more over the posterior trochanter. The original pressure ulcer looks quite stable has healthy granulation. 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 Almost fully epithelialized on the original superior area using Hydrofera Blue ooDTI on the left heel has peeled off this week to reveal a small superficial wound under denuded skin and subcutaneous tissue ooBoth areas between the first and second toes look better including nothing open on the left 02/21/18; ooThe patient's wounds on his left  ischial tuberosity and posterior left greater trochanter actually looked better. He has a large area of irritation around the area which I think is contact dermatitis. I am doubtful that this is fungal ooHis original wound on the left lateral calf continues to improve we have been using Hydrofera Blue ooThere is no open area in the left first second toe web space although there is a lot of thick callus ooThe DTI on the left heel required debridement today of necrotic surface eschar and subcutaneous tissue using silver alginate ooFinally the area on the right first second toe webspace continues to contract using silver alginate and ketoconazole 02/28/18 ooLeft ischial tuberosity  wounds look better using silver alginate. ooOriginal wound on the left calf only has one small open area left using Hydrofera Blue ooDTI on the left heel required debridement mostly removing skin from around this wound surface. Using silver alginate ooThe areas on the right first/second toe web space using silver alginate and ketoconazole 03/08/18 on evaluation today patient appears to be doing decently well as best I can tell in regard to his wounds. This is the first time that I have seen him as he generally is followed by Dr. Dellia Nims. With that being said none of his wounds appear to be infected he does have an area where there is some skin covering what appears to be a new wound on the left dorsal surface of his great toe. This is right at the nail bed. With that being said I do believe that debrided away some of the excess skin can be of benefit in this regard. Otherwise he has been tolerating the dressing changes without complication. 03/14/18; patient arrives today with the multiplicity of wounds that we are following. He has not been systemically unwell ooOriginal wound on the left lateral calf now only has 2 small open areas we've been using Hydrofera Blue which should continue ooThe deep tissue injury on the left heel requires debridement today. We've been using silver alginate ooThe left first second toe and the right first second toe are both are reminiscence what I think was tinea pedis. Apparently some of the callus Surface between the toes was removed last week when it started draining. ooPurulent drainage coming from the wound on the ischial tuberosity on the left. 03/21/18-He is here in follow-up evaluation for multiple wounds. 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 He tolerated debridement. The only change to last week's recommendations is to discontinue antifungal cream between toes. He will  follow-up next week 03/28/18; following up for multiple wounds;Concern this week is streaking redness and swelling in the right foot. He is going to need antibiotics for this. 03/31/18; follow-up for right foot cellulitis. Streaking redness and swelling in the right foot on 03/28/18. 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 I put him on clindamycin 300 mg every 6 and brought him in for a quick check. He has an open wound between his first and second toes on the right foot as a potential source. 04/04/18; ooRight foot cellulitis is resolving he is completing clindamycin. This is truly good news ooLeft lateral calf wound which is initial wound only has one small open area inferiorly this is close to healing out. He has compression stockings. We will use Hydrofera Blue right down to the epithelialization of this ooNonviable surface on the left heel which was initially pressure with a DTI. We've been using Hydrofera Blue. I'm going to  switch this back to silver alginate ooLeft first second toe/tinea pedis this looks better using silver alginate ooRight first second toe tinea pedis using silver alginate ooLarge pressure ulcers on theLeft ischial tuberosity. Small wound here Looks better. I am uncertain about the surface over the large wound. Using silver alginate 04/11/18; ooCellulitis in the right foot is resolved ooLeft lateral calf wound which was his original wounds still has 2 tiny open areas remaining this is just about closed ooNonviable surface on the left heel is better but still requires debridement ooLeft first second toe/tinea pedis still open using silver alginate ooRight first second toe wound tinea pedis I asked him to go back to using ketoconazole and silver alginate ooLarge pressure ulcers on the left ischial tuberosity this shear injury here is resolved. Wound is smaller. No evidence of infection using silver alginate 04/18/18; ooPatient arrives with  an intense area of cellulitis in the right mid lower calf extending into the right heel area. Bright red and warm. Smaller area on the left anterior leg. He has a significant history of MRSA. He will definitely need antibioticsoodoxycycline ooHe now has 2 open areas on the left ischial tuberosity the original large wound and now a satellite area which I think was above his initial satellite areas. Not a wonderful surface on this satellite area surrounding erythema which looks like pressure related. ooHis left lateral calf wound again his original wound is just about closed ooLeft heel pressure injury still requiring debridement ooLeft first second toe looks a lot better using silver alginate ooRight first second toe also using silver alginate and ketoconazole cream also looks better 04/20/18; the patient was worked in early today out of concerns with his cellulitis on the right leg. I had started him on doxycycline. This was 2 days ago. His wife was concerned about the swelling in the area. Also concerned about the left buttock. He has not been systemically unwell no fever chills. No nausea vomiting or diarrhea 04/25/18; the patient's left buttock wound is continued to deteriorate he is using Hydrofera Blue. He is still completing clindamycin for the cellulitis on the right leg although all of this looks better. 05/02/18 ooLeft buttock wound still with a lot of drainage and a very tightly adherent fibrinous necrotic surface. He has a deeper area superiorly ooThe left lateral calf wound is still closed ooDTI wound on the left heel necrotic surface especially the circumference using Iodoflex ooAreas between his left first second toe and right first second toe both look better. Dorsally and the right first second toe he had a necrotic surface although at smaller. In using silver alginate and ketoconazole. I did a culture last week which was a deep tissue culture of the reminiscence of the  open wound on the right first second toe dorsally. This grew a few Acinetobacter and a few methicillin-resistant staph aureus. Nevertheless the area actually this week looked better. I didn't feel the need to specifically address this at least in terms of systemic antibiotics. 05/09/18; wounds are measuring larger more drainage per our intake. We are using Santyl covered with alginate on the large superficial buttock wounds, Iodosorb on the left heel, ketoconazole and silver alginate to the dorsal first and second toes bilaterally. 05/16/18; ooThe area on his left buttock better in some aspects although the area superiorly over the ischial tuberosity required an extensive debridement.using Santyl ooLeft heel appears stable. Using Iodoflex ooThe areas between his first and second toes are not bad however there is spreading erythema  up the dorsal aspect of his left foot this looks like cellulitis again. He is insensate the erythema is really very brilliant.o Erysipelas He went to see an allergist days ago because he was itching part of this he had lab work done. This showed a white count of 15.1 with 70% neutrophils. Hemoglobin of 11.4 and a platelet count of 659,000. Last white count we had in Epic was a 2-1/2 years ago which was 25.9 but he was ill at the time. He was able to show me some lab work that was done by his primary physician the pattern is about the same. 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 But prompted me to go ahead and do x-rays of both feet and the pelvis rule out osteomyelitis. He also had a comprehensive metabolic panel this was reasonably normal his albumin was 3.7 liver function tests BUN/creatinine all normal 05/23/18; x-rays of both his feet from last week were negative for underlying pulmonary abnormality. The x-ray of his pelvis however showed mild irregularity in the left ischial which may represent some early  osteomyelitis. The wound in the left ischial continues to get deeper clearly now exposed muscle. Each week necrotic surface material over this area. Whereas the rest of the wounds do not look so bad. ooThe left ischial wound we have been using Santyl and calcium alginate ooTo the left heel surface necrotic debris using Iodoflex ooThe left lateral leg is still healed ooAreas on the left dorsal foot and the right dorsal foot are about the same. There is some inflammation on the left which might represent contact dermatitis, fungal dermatitis I am doubtful cellulitis although this looks better than last week 05/30/18; CT scan done at Hospital did not show any osteomyelitis or abscess. Suggested the possibility of underlying cellulitis although I don't see a lot of evidence of this at the bedside ooThe wound itself on the left buttock/upper thigh actually looks somewhat better. No debridement ooLeft heel also looks better no debridement continue Iodoflex ooBoth dorsal first second toe spaces appear better using Lotrisone. Left still required debridement 06/06/18; ooIntake reported some purulent looking drainage from the left gluteal wound. Using Santyl and calcium alginate ooLeft heel looks better although still a nonviable surface requiring debridement ooThe left dorsal foot first/second webspace actually expanding and somewhat deeper. I may consider doing a shave biopsy of this area ooRight dorsal foot first/second webspace appears stable to improved. Using Lotrisone and silver alginate to both these areas 06/13/18 ooLeft gluteal surface looks better. Now separated in the 2 wounds. No debridement required. Still drainage. We'll continue silver alginate ooLeft heel continues to look better with Iodoflex continue this for at least another week ooOf his dorsal foot wounds the area on the left still has some depth although it looks better than last week. We've been using Lotrisone and  silver alginate 06/20/18 ooLeft gluteal continues to look better healthy tissue ooLeft heel continues to look better healthy granulation wound is smaller. He is using Iodoflex and his long as this continues continue the Iodoflex ooDorsal right foot looks better unfortunately dorsal left foot does not. There is swelling and erythema of his forefoot. He had minor trauma to this several days ago but doesn't think this was enough to have caused any tissue injury. Foot looks like cellulitis, we have had this problem before 06/27/18 on evaluation today patient appears to be doing a little worse in regard to his foot ulcer. Unfortunately  it does appear that he has methicillin-resistant staph aureus and unfortunately there really are no oral options for him as he's allergic to sulfa drugs as well as I box. Both of which would really be his only options for treating this infection. In the past he has been given and effusion of Orbactiv. This is done very well for him in the past again it's one time dosing IV antibiotic therapy. Subsequently I do believe this is something we're gonna need to see about doing at this point in time. Currently his other wounds seem to be doing somewhat better in my pinion I'm pretty happy in that regard. 07/03/18 on evaluation today patient's wounds actually appear to be doing fairly well. He has been tolerating the dressing changes without complication. All in all he seems to be showing signs of improvement. In regard to the antibiotics he has been dealing with infectious disease since I saw him last week as far as getting this scheduled. In the end [facility]'s [facility] this coming Friday. In the meantime he has been continuing to perform the dressing changes in such as previous. There does not appear to be any evidence of infection worsengin at this time. 07/10/18; ooSince I last saw this man 2 weeks ago things have  actually improved. IV antibiotics of resulted in less forefoot erythema although there is still some present. He is not systemically unwell ooLeft buttock wounds o2 now have no depth there is increased epithelialization Using silver alginate ooLeft heel still requires debridement using Iodoflex ooLeft dorsal foot still with a sizable wound about the size of a border but healthy granulation ooRight dorsal foot still with a slitlike area using silver alginate 07/18/18; the patient's cellulitis in the left foot is improved in fact I think it is on its way to resolving. ooLeft buttock wounds o2 both look better although the larger one has hypertension granulation we've been using silver alginate ooLeft heel has some thick circumferential redundant skin over the wound edge which will need to be removed today we've been using Iodoflex ooLeft dorsal foot is still a sizable wound required debridement using silver alginate ooThe right dorsal foot is just about closed only a small open area remains here 07/25/18; left foot cellulitis is resolved ooLeft buttock wounds o2 both look better. Hyper-granulation on the major area ooLeft heel as some debris over the surface but otherwise looks a healthier wound. Using silver collagen ooRight dorsal foot is just about closed 07/31/18; arrives with our intake nurse worried about purulent drainage from the buttock. We had hyper-granulation here last week ooHis buttock wounds o2 continue to look better ooLeft heel some debris over the surface but measuring smaller. ooRight dorsal foot unfortunately has openings between the toes ooLeft foot superficial wound looks less aggravated. 08/07/18 ooButtock wounds continue to look better although some of her granulation and the larger medial wound. silver alginate ooLeft heel continues to look a lot better.silver collagen ooLeft foot superficial wound looks less stable. Requires debridement. He has a new  wound superficial area on the foot on the lateral dorsal foot. ooRight foot looks better using silver alginate without Lotrisone 08/14/2018; patient was in the ER last week diagnosed with a UTI. He is now on Cefpodoxime and Macrodantin. ooButtock wounds continued to be smaller. Using silver alginate ooLeft heel continues to look better using silver collagen ooLeft foot superficial wound looks as though it is improving ooRight dorsal foot  area is just about healed. 08/21/2018; patient is completed his antibiotics for his UTI. ooHe has 2 open areas on the buttocks. There is still not closed although the surface looks satisfactory. Using silver alginate ooLeft heel continues to improve using silver collagen ooThe bilateral dorsal foot areas which are at the base of his first and second toes/possible tinea pedis are actually stable on the left but worse on the right. The area on the left required debridement of necrotic surface. After debridement I obtained a specimen for PCR culture. ooThe right dorsal foot which is been just about healed last week is now reopened 08/28/2018; culture done on the left dorsal foot showed coag negative staph both staph epidermidis and Lugdunensis. I think this is worthwhile initiating systemic treatment. I will use doxycycline given his long list of allergies. The area on the left heel slightly improved but still requiring debridement. ooThe large wound on the buttock is just about closed whereas the smaller one is larger. Using silver alginate in this area 09/04/2018; patient is completing his doxycycline for the left foot although this continues to be a very difficult wound area with very adherent necrotic debris. We are using silver alginate to all his wounds right foot left foot and the small wounds on his buttock, silver collagen on the left heel. 09/11/2018; once again this patient has intense erythema and swelling of the left forefoot. Lesser degrees  of erythema in the right foot. He has a long list of allergies and intolerances. I will reinstitute doxycycline. 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 Using silver alginate ooLeft heel also looks better using silver collagen ooUnfortunately both the areas on his feet look worse. The area on the left first second webspace is now gone through to the plantar part of his foot. The area on the left foot anteriorly is irritated with erythema and swelling in the forefoot. 09/25/2018 ooHis wound on the left plantar heel looks better. Using silver collagen ooThe area on the left buttock 2 small remnant areas. One is closed one is still open. Using silver alginate ooThe areas between both his first and second toes look worse. This in spite of long-standing antifungal therapy with ketoconazole and silver alginate which should have antifungal activity ooHe has small areas around his original wound on the left calf one is on the bottom of the original scar tissue and one superiorly both of these are small and superficial but again given wound history in this site this is worrisome 10/02/2018 ooLeft plantar heel continues to gradually contract using silver collagen ooLeft buttock wound is unchanged using silver alginate ooThe areas on his dorsal feet between his first and second toes bilaterally look about the same. 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 Small open area here. We have been using silver alginate 10/09/2018; ooLeft plantar heel continues to look healthy and contract. No debridement is required ooLeft buttock slightly smaller with a tape injury wound just below which was new this week ooDorsal feet somewhat improved I have been using  clindamycin ooLeft lateral looks lower extremity the actual open area looks worse although a lot of this is epithelialized. I am going to change to silver collagen today He has a lot more swelling in the right leg although this is  not pitting not red and not particularly warm there is a lot of spasm in the right leg usually indicative of people with paralysis of some underlying discomfort. We have reviewed his vascular status from 2017 he had a left greater saphenous vein ablation. I wonder about referring him back to vascular surgery if the area on the left leg continues to deteriorate. 10/16/2018 in today for follow-up and management of multiple lower extremity ulcers. His left Buttock wound is much lower smaller and almost closed completely. The wound to the left ankle has began to reopen with Epithelialization and some adherent slough. He has multiple new areas to the left foot and leg. The left dorsal foot without much improvement. Wound present between left great webspace and 2nd toe. Erythema and edema present right leg. Right LE ultrasound obtained on 10/10/18 was negative for DVT. 10/23/2018; ooLeft buttock is closed over. Still dry macerated skin but there is no open wound. I suspect this is chronic pressure/moisture ooLeft lateral calf is quite a bit worse than when I saw this last. There is clearly drainage here he has macerated skin into the left plantar heel. We will change the primary dressing to alginate ooLeft dorsal foot has some improvement in overall wound area. Still using clindamycin and silver alginate ooRight dorsal foot about the same as the left using clindamycin and silver alginate ooThe erythema in the right leg has resolved. He is DVT rule out was negative ooLeft heel pressure area required debridement although the wound is smaller and the surface is health 10/26/2018 ooThe patient came back in for his nurse check today predominantly because of the drainage  coming out of the left lateral leg with a recent reopening of his original wound on the left lateral calf. He comes in today with a large amount of surrounding erythema around the wound extending from the calf into the ankle and even in the area on the dorsal foot. He is not systemically unwell. He is not febrile. Nevertheless this looks like cellulitis. We have been using silver alginate to the area. I changed him to a regular visit and I am going to prescribe him doxycycline. The rationale here is a long list of medication intolerances and a history of MRSA. I did not see anything that I thought would provide a valuable culture 10/30/2018 ooFollow-up from his appointment 4 days ago with really an extensive area of cellulitis in the left calf left lateral ankle and left dorsal foot. I put him on doxycycline. He has a long list of medication allergies which are true allergy reactions. 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 In any case he is a lot better today. The erythema especially in the anterior and lateral left calf is better. He still has left ankle erythema. He also is complaining about increasing edema in the right leg we have only been using Kerlix Coban and he has been doing the wraps at home. Finally he has a spotty rash on the medial part of his upper left calf which looks like folliculitis or perhaps wrap occlusion type injury. Small superficial macules not pustules 11/06/18 patient arrives today with again a considerable degree of erythema around the wound on the left lateral calf extending into the dorsal ankle and dorsal foot. This is a lot worse than when I saw this last week. He is on doxycycline really with not a lot of improvement. He has not been systemically unwell  Wounds on the; left heel actually looks improved. Original area on the left foot and proximity to the first and second toes looks about the  same. He has superficial areas on the dorsal foot, anterior calf and then the reopening of his original wound on the left lateral calf which looks about the same ooThe only area he has on the right is the dorsal webspace first and second which is smaller. ooHe has a large area of dry erythematous skin on the left buttock small open area here. 11/13/2018; the patient arrives in much better condition. The erythema around the wound on the left lateral calf is a lot better. Not sure whether this was the clindamycin or the TCA and ketoconazole or just in the improvement in edema control [stasis dermatitis]. In any case this is a lot better. The area on the left heel is very small and just about resolved using silver collagen we have been using silver alginate to the areas on his dorsal feet 11/20/2018; his wounds include the left lateral calf, left heel, dorsal aspects of both feet just proximal to the first second webspace. He is stable to slightly improved. I did not think any changes to his dressings were going to be necessary 11/27/2018 he has a reopening on the left buttock which is surrounded by what looks like tinea or perhaps some other form of dermatitis. The area on the left dorsal foot has some erythema around it I have marked this area but I am not sure whether this is cellulitis or not. Left heel is not closed. Left calf the reopening is really slightly longer and probably worse 1/13; in general things look better and smaller except for the left dorsal foot. Area on the left heel is just about closed, left buttock looks better only a small wound remains in the skin looks better [using Lotrisone] 1/20; the area on the left heel only has a few remaining open areas here. Left lateral calf about the same in terms of size, left dorsal foot slightly larger right lateral foot still not closed. The area on the left buttock has no open wound and the surrounding skin looks a lot better 1/27; the  area on the left heel is closed. Left lateral calf better but still requiring extensive debridements. The area on his left buttock is closed. He still has the open areas on the left dorsal foot which is slightly smaller in the right foot which is slightly expanded. We have been using Iodoflex on these areas as well 2/3; left heel is closed. Left lateral calf still requiring debridement using Iodoflex there is no open area on his left buttock however he has dry scaly skin over a large area of this. Not really responding well to the Lotrisone. Finally the areas on his dorsal feet at the level of the first second webspace are slightly smaller on the right and about the same on the left. Both of these vigorously debrided with Anasept and gauze 2/10; left heel remains closed he has dry erythematous skin over the left buttock but there is no open wound here. Left lateral leg has come in and with. Still requiring debridement we have been using Iodoflex here. Finally the area on the left dorsal foot and right dorsal foot are really about the same extremely dry callused fissured areas. He does not yet have a dermatology appointment 2/17; left heel remains closed. He has a new open area on the left buttock. The area on the left  lateral calf is bigger longer and still covered in necrotic debris. No major change in his foot areas bilaterally. I am awaiting for a dermatologist to look on this. We have been using ketoconazole I do not know that this is been doing any good at all. 2/24; left heel remains closed. The left buttock wound that was new reopening last week looks better. The left lateral calf appears better also although still requires debridement. The major area on his foot is the left first second also requiring debridement. We have been putting Prisma on all wounds. I do not believe that the ketoconazole has done too much good for his feet. He will use Lotrisone I am going to give him a 2-week  course of terbinafine. We still do not have a dermatology appointment 3/2 left heel remains closed however there is skin over bone in this area I pointed this out to him today. The left buttock wound is epithelialized but still does not look completely stable. The area on the left leg required debridement were using silver collagen here. With regards to his feet we changed to Lotrisone last week and silver alginate. 3/9; left heel remains closed. Left buttock remains closed. The area on the right foot is essentially closed. The left foot remains unchanged. Slightly smaller on the left lateral calf. Using silver collagen to both of these areas 3/16-Left heel remains closed. Area on right foot is closed. Left lateral calf above the lateral malleolus open wound requiring debridement with easy bleeding. Left dorsal wound proximal to first toe also debrided. Left ischial area open new. Patient has been using Prisma with wrapping every 3 days. Dermatology appointment is apparently tomorrow.Patient has completed his terbinafine 2-week course with some apparent improvement according to him, there is still flaking and dry skin in his foot on the left 3/23; area on the right foot is reopened. The area on the left anterior foot is about the same still a very necrotic adherent surface. He still has the area on the left leg and reopening is on the left buttock. He apparently saw dermatology although I do not have a note. According to the patient who is usually fairly well informed they did not have any good ideas. Put him on oral terbinafine which she is been on before. 3/30; using silver collagen to all wounds. Apparently his dermatologist put him on doxycycline and rifampin presumably some culture grew staph. I do not have this result. He remains on terbinafine although I have used terbinafine on him before 4/6; patient has had a fairly substantial reopening on the right foot between the first and second  toes. He is finished his terbinafine and I believe is on doxycycline and rifampin still as prescribed by dermatology. We have been using silver collagen to all his wounds although the patient reports that he thinks silver alginate does better on the wounds on his buttock. 4/13; the area on his left lateral calf about the same size but it did not require debridement. ooLeft dorsal foot just proximal to the webspace between the first and second toes is about the same. Still nonviable surface. I note some superficial bronze discoloration of the dorsal part of his foot ooRight dorsal foot just proximal to the first and second toes also looks about the same. I still think there may be the same discoloration I noted above on the left ooLeft buttock wound looks about the same 4/20; left lateral calf appears to be gradually contracting using silver collagen. ooHe  remains on erythromycin empiric treatment for possible erythrasma involving his digital spaces. The left dorsal foot wound is debrided of tightly adherent necrotic debris and really cleans up quite nicely. The right area is worse with expansion. I did not debride this it is now over the base of the second toe ooThe area on his left buttock is smaller no debridement is required using silver collagen 5/4; left calf continues to make good progress. ooHe arrives with erythema around the wounds on his dorsal foot which even extends to the plantar aspect. Very concerning for coexistent infection. He is finished the erythromycin I gave him for possible erythrasma this does not seem to have helped. ooThe area on the left foot is about the same base of the dorsal toes ooIs area on the buttock looks improved on the left 5/11; left calf and left buttock continued to make good progress. Left foot is about the same to slightly improved. ooMajor problem is on the right foot. He has not had an x-ray. Deep tissue culture I did last week showed  both Enterobacter and E. coli. I did not change the doxycycline I put him on empirically although neither 1 of these were plated to doxycycline. He arrives today with the erythema looking worse on both the dorsal and plantar foot. Macerated skin on the bottom of the foot. he has not been systemically unwell 5/18-Patient returns at 1 week, left calf wound appears to be making some progress, left buttock wound appears slightly worse than last time, left foot wound looks slightly better, right foot redness is marginally better. X-ray of both feet show no air or evidence of osteomyelitis. Patient is finished his Omnicef and terbinafine. He continues to have macerated skin on the bottom of the left foot as well as right 5/26; left calf wound is better, left buttock wound appears to have multiple small superficial open areas with surrounding macerated skin. X-rays that I did last time showed no evidence of osteomyelitis in either foot. He is finished cefdinir and doxycycline. I do not think that he was on terbinafine. He continues to have a large superficial open area on the right foot anterior dorsal and slightly between the first and second toes. I did send him to dermatology 2 months ago or so wondering about whether they would do a fungal scraping. I do not believe they did but did do a culture. We have been using silver alginate to the toe areas, he has been using antifungals at home topically either ketoconazole or Lotrisone. We are using silver collagen on the left foot, silver alginate on the right, silver collagen on the left lateral leg and silver alginate on the left buttock 6/1; left buttock area is healed. We have the left dorsal foot, left lateral leg and right dorsal foot. We are using silver alginate to the areas on both feet and silver collagen to the area on his left lateral calf 6/8; the left buttock apparently reopened late last week. He is not really sure how this happened. He is  tolerating the terbinafine. Using silver alginate to all wounds 6/15; left buttock wound is larger than last week but still superficial. ooCame in the clinic today with a report of purulence from the left lateral leg I did not identify any infection ooBoth areas on his dorsal feet appear to be better. He is tolerating the terbinafine. Using silver alginate to all wounds 6/22; left buttock is about the same this week, left calf quite a bit better.  His left foot is about the same however he comes in with erythema and warmth in the right forefoot once again. Culture that I gave him in the beginning of May showed Enterobacter and E. coli. I gave him doxycycline and things seem to improve although neither 1 of these organisms was specifically plated. 6/29; left buttock is larger and dry this week. Left lateral calf looks to me to be improved. Left dorsal foot also somewhat improved right foot completely unchanged. The erythema on the right foot is still present. He is completing the Ceftin dinner that I gave him empirically [see discussion above.) 7/6 - All wounds look to be stable and perhaps improved, the left buttock wound is slightly smaller, per patient bleeds easily, completed ceftin, the right foot redness is less, he is on terbinafine 7/13; left buttock wound about the same perhaps slightly narrower. Area on the left lateral leg continues to narrow. Left dorsal foot slightly smaller right foot about the same. We are using silver alginate on the right foot and Hydrofera Blue to the areas on the left. Unna boot on the left 2 layer compression on the right 7/20; left buttock wound absolutely the same. Area on lateral leg continues to get better. Left dorsal foot require debridement as did the right no major change in the 7/27; left buttock wound the same size necrotic debris over the surface. The area on the lateral leg is closed once again. His left foot looks better right foot about the same  although there is some involvement now of the posterior first second toe area. He is still on terbinafine which I have given him for a month, not certain a centimeter major change 06/25/19-All wounds appear to be slightly improved according to report, left buttock wound looks clean, both foot wounds have minimal to no debris the right dorsal foot has minimal slough. We are using Hydrofera Blue to the left and silver alginate to the right foot and ischial wound. 8/10-Wounds all appear to be around the same, the right forefoot distal part has some redness which was not there before, however the wound looks clean and small. Ischial wound looks about the same with no changes 8/17; his wound on the left lateral calf which was his original chronic venous insufficiency wound remains closed. Since I last saw him the areas on the left dorsal foot right dorsal foot generally appear better but require debridement. The area on his left initial tuberosity appears somewhat larger to me perhaps hyper granulated and bleeds very easily. We have been using Hydrofera Blue to the left dorsal foot and silver alginate to everything else 8/24; left lateral calf remains closed. The areas on his dorsal feet on the webspace of the first and second toes bilaterally both look better. The area on the left buttock which is the pressure ulcer stage II slightly smaller. I change the dressing to Hydrofera Blue to all areas 8/31; left lateral calf remains closed. The area on his dorsal feet bilaterally look better. Using Hydrofera Blue. Still requiring debridement on the left foot. No change in the left buttock pressure ulcers however 9/14; left lateral calf remains closed. Dorsal feet look quite a bit better than 2 weeks ago. Flaking dry skin also a lot better with the ammonium lactate I gave him 2 weeks ago. The area on the left buttock is improved. He states that his Roho cushion developed a leak and he is getting a new one, in  the interim he is offloading  this vigorously 9/21; left calf remains closed. Left heel which was a possible DTI looks better this week. He had macerated tissue around the left dorsal foot right foot looks satisfactory and improved left buttock wound. I changed his dressings to his feet to silver alginate bilaterally. Continuing Hydrofera Blue on the left buttock. 9/28 left calf remains closed. Left heel did not develop anything [possible DTI] dry flaking skin on the left dorsal foot. Right foot looks satisfactory. Improved left buttock wound. We are using silver alginate on his feet Hydrofera Blue on the buttock. I have asked him to go back to the Lotrisone on his feet including the wounds and surrounding areas 10/5; left calf remains closed. The areas on the left and right feet about the same. A lot of this is epithelialized however debris over the remaining open areas. He is using Lotrisone and silver alginate. The area on the left buttock using Hydrofera Blue 10/26. Patient has been out for 3 weeks secondary to Covid concerns. He tested negative but I think his wife tested positive. He comes in today with the left foot substantially worse, right foot about the same. Even more concerning he states that the area on his left buttock closed over but then reopened and is considerably deeper in one aspect than it was before [stage III wound] 11/2; left foot really about the same as last week. Quarter sized wound on the dorsal foot just proximal to the first second toes. Surrounding erythema with areas of denuded epithelium. This is not really much different looking. Did not look like cellulitis this time however. ooRight foot area about the same.. We have been using silver alginate alginate on his toes ooLeft buttock still substantial irritated skin around the wound which I think looks somewhat better. We have been using Hydrofera Blue here. 11/9; left foot larger than last week and a very  necrotic surface. Right foot I think is about the same perhaps slightly smaller. Debris around the circumference also addressed. Unfortunately on the left buttock there is been a decline. Satellite lesions below the major wound distally and now a an additional one posteriorly we have been using Hydrofera Blue but I think this is a pressure issue 11/16; left foot ulcer dorsally again a very adherent necrotic surface. Right foot is about the same. Not much change in the pressure ulcer on his left buttock. 11/30; left foot ulcer dorsally basically the same as when I saw him 2 weeks ago. Very adherent fibrinous debris on the wound surface. Patient reports a lot of drainage as well. 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 We have been using Iodoflex, patient changed back to alginate because of the drainage. Area on his right dorsal foot really looks benign with a healthier surface certainly a lot better than on the left. Left buttock wounds all improved using Hydrofera Blue 12/7; left dorsal foot again no improvement. Tightly adherent debris. PCR culture I did last week only showed likely skin contaminant. I have gone ahead and done a punch biopsy of this which is about the last thing in terms of investigations I can think to do. He has known venous insufficiency and venous hypertension and this could be the issue here. The area on the right foot is about the same left buttock slightly worse according to our intake nurse secondary to [facility] Blue sticking to the wound 12/14; biopsy of the left foot that I did last time showed changes that  could be related to wound healing/chronic stasis dermatitis phenomenon no neoplasm. We have been using silver alginate to both feet. I change the one on the left today to Sorbact and silver alginate to his other 2 wounds 12/28; the patient arrives with the following problems; ooMajor issue is the dorsal left foot  which continues to be a larger deeper wound area. Still with a completely nonviable surface ooParadoxically the area mirror image on the right on the right dorsal foot appears to be getting better. ooHe had some loss of dry denuded skin from the lower part of his original wound on the left lateral calf. Some of this area looked a little vulnerable and for this reason we put him in wrap that on this side this week ooThe area on his left buttock is larger. He still has the erythematous circular area which I think is a combination of pressure, sweat. This does not look like cellulitis or fungal dermatitis 11/26/2019; -Dorsal left foot large open wound with depth. Still debris over the surface. Using Sorbact ooThe area on the dorsal right foot paradoxically has closed over [facility] has a reopening on the left ankle laterally at the base of his original wound that extended up into the calf. This appears clean. ooThe left buttock wound is smaller but with very adherent necrotic debris over the surface. We have been using silver alginate here as well The patient had arterial studies done in 2017. He had biphasic waveforms at the dorsalis pedis and posterior tibial bilaterally. ABI in the left was 1.17. Digit waveforms were dampened. He has slight spasticity in the great toes I do not think a TBI would be possible Objective Constitutional Sitting or standing Blood Pressure is within target range for patient.. Pulse regular and within target range for patient.Marland Kitchen Respirations regular, non-labored and within target range.. Temperature is normal and within the target range for the patient.Marland Kitchen Appears in no distress. Vitals Time Taken: 7:44 AM, Height: 70 in, Weight: 216 lbs, BMI: 31, Temperature: 98.5 F, Pulse: 99 bpm, Respiratory Rate: 16 breaths/min, Blood Pressure: 130/76 mmHg. General Notes: Wound exam ooLeft dorsal foot. Very necrotic surface about stable from last week however. I am continuing  Sorbact ooRight dorsal foot appears closed I recommended skin lubrication ooNew opening on the left lateral ankle limited to skin breakdown. Nevertheless I think this should be aggressively treated this was a very difficult wound to heal ooButtock wound required debridement with a #5 curette to remove any adherent surface. Hemostasis with silver nitrate and pressure dressing Integumentary (Hair, Skin) Wound #17 status is Healed - Epithelialized. Original cause of wound was Gradually Appeared. The wound is located on the Right,Dorsal Toe - Web between 1st and 2nd. The wound measures 0cm length x 0cm width x 0cm depth; 0cm^2 area and 0cm^3 volume. There is no tunneling or undermining noted. There is a none present amount of drainage noted. The wound margin is distinct with the outline attached to the wound base. There is no granulation within the wound bed. There is no necrotic tissue within the wound bed. Wound #24 status is Open. Original cause of wound was Gradually Appeared. The wound is located on the Left,Dorsal Foot. The wound measures 3cm length x 3.5cm width x 0.4cm depth; 8.247cm^2 area and 3.299cm^3 volume. There is Fat Layer (Subcutaneous Tissue) Exposed exposed. There is no tunneling or undermining noted. There is a medium amount of purulent drainage noted. Foul odor after cleansing was noted. Foul odor after cleansing was noted due  to product use. The wound margin is distinct with the outline attached to the wound base. There is small (1-33%) red granulation within the wound bed. There is a large (67-100%) amount of necrotic tissue within the wound bed including Adherent Slough. Wound #35 status is Open. Original cause of wound was Gradually Appeared. The wound is located on the Left Ischium. The wound measures 3cm length x 0.9cm width x 0.1cm depth; 2.121cm^2 area and 0.212cm^3 volume. There is Fat Layer (Subcutaneous Tissue) Exposed exposed. There is no undermining noted. There is  a medium amount of serosanguineous drainage noted. The wound margin is flat and intact. There is large (67-100%) red, friable granulation within the wound bed. There is no necrotic tissue within the wound bed. Wound #37 status is Open. Original cause of wound was Gradually Appeared. The wound is located on the Left,Lateral Malleolus. The wound measures 3.6cm length x 1.4cm width x 0.1cm depth; 3.958cm^2 area and 0.396cm^3 volume. There is Fat Layer (Subcutaneous Tissue) Exposed exposed. There is no tunneling or undermining noted. There is a medium amount of serosanguineous drainage noted. The wound margin is distinct with the outline attached to the wound base. There is large (67-100%) pink granulation within the wound bed. There is a small (1-33%) amount of necrotic tissue within the wound bed including Adherent Slough. 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 There was a Excisional Skin/Subcutaneous Tissue Debridement with a total area of 2.7 sq cm performed by Ricard Dillon., MD. With the following instrument(s): Curette to remove Viable and Non-Viable tissue/material. Material removed includes Subcutaneous Tissue and Biofilm and. No specimens were taken. A time out was conducted at 08:22, prior to the start of the procedure. A Moderate amount of bleeding was controlled with Silver Nitrate. The procedure was tolerated well with a pain level of 0 throughout and a pain level of 0 following the procedure. Post Debridement Measurements: 3cm length x 0.9cm width x 0.1cm depth; 0.212cm^3 volume. Post debridement Stage noted as Category/Stage III. Character of Wound/Ulcer Post  Debridement is improved. 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 There was a Three Layer Compression Therapy Procedure by Levan Hurst, RN. Post procedure Diagnosis Wound #37: Same as Pre-Procedure Plan Follow-up Appointments: Return Appointment in 1 week. Dressing Change Frequency: Wound #24 Left,Dorsal Foot: Do not change entire dressing for one week. Wound #37 Left,Lateral Malleolus: Do not change entire dressing for one week. Wound #35 Left Ischium: Change Dressing every other day. Skin Barriers/Peri-Wound Care: Antifungal cream - on toes on both feet daily Moisturizing lotion - both legs Other: - Triamcinolone cream Wound #24 Left,Dorsal Foot: Barrier cream Wound #35 Left Ischium: Barrier cream Primary Wound Dressing: Wound #24 Left,Dorsal Foot: Other: - Sorbact Wound #35 Left Ischium: Calcium Alginate with Silver Wound #37 Left,Lateral Malleolus: Calcium Alginate with Silver Secondary Dressing: Wound #24 Left,Dorsal Foot: Dry Gauze Heel Cup - add heel cup to left heel for protection. Wound #35 Left Ischium: Foam Border Wound #37 Left,Lateral Malleolus: Dry Gauze Edema Control: 3 Layer Compression System - Left Lower Extremity Elevate legs to the level of the heart or above  for 30 minutes daily and/or when sitting, a frequency of: - throughout the day Support Garment 30-40 mm/Hg pressure to: - Juxtalite to right leg Off-Loading: Low air-loss mattress (Group 2) Roho cushion for wheelchair Turn and reposition every 2 hours - out of wheelchair throughout the day, try to lay on sides, sleep in the bed not the recliner Radiology ordered were: MRI, left foot with and without contrast - Non healing ulcer on left dorsal foot, rule out Osteomyelitis. Obtain I-Stat Creatinine if needed prior to exam. 1. Silver alginate to the left lateral  ankle and left buttock 2. Continue Sorbact of the left foot 3. I am going to repeat his arterial studies. I cannot feel a posterior tibial pulse or a popliteal pulse although I can feel a somewhat reduced femoral pulse and a dorsalis pedis pulse. 4. MRI of the left foot 5. My working hypothesis is been that this is most recently a venous hypertension issue I want to make sure that is correct once and for all Electronic Signature(s) Signed: 11/26/2019 4:56:38 PM By: Linton Ham MD Entered By: Linton Ham on 11/26/2019 08:53:30 -------------------------------------------------------------------------------- SuperBill Details Patient Name: Date of Service: Mcclurg, [name] 11/26/2019 Medical Record [id number] Patient Account Number: [id number] Date of Birth/Sex: Treating RN: [birth year] (34 y.o. 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

## 2019-11-26 NOTE — Progress Notes (Addendum)
[name], [name] ([id number]) Visit Report for 11/26/2019 Arrival Information Details Patient Name: Date of Service: [name], [name] 11/26/2019 7:30 AM Medical Record [id number] Patient Account Number: [id number] Date of Birth/Sex: Treating RN: February 11, 1986 (34 y.o. [name] Primary Care Jahnessa Vanduyn: [name], [name] Other Clinician: Referring Brigido Mera: Treating Ollivander See/Extender:Robson, [name], [name]: 203 Visit Information History Since Last Visit Added or deleted any medications: No Patient Arrived: Wheel Chair Any new allergies or adverse reactions: No Arrival Time: 07:39 Had a fall or experienced change in No activities of daily living that may affect Accompanied By: self risk of falls: Transfer Assistance: None Signs or symptoms of abuse/neglect since last No Patient Identification Verified: Yes visito Secondary Verification Process Completed: Yes Hospitalized since last visit: No Patient Requires Transmission-Based No Implantable device outside of the clinic excluding No Precautions: cellular tissue based products placed in the center Patient Has Alerts: Yes since last visit: Patient Alerts: R ABI = Has Dressing in Place as Prescribed: Yes 1.0 Has Compression in Place as Prescribed: Yes L ABI = Pain Present Now: No 1.1 Electronic Signature(s) Signed: 11/26/2019 4:44:32 PM By: Deon Pilling Entered By: Deon Pilling on 11/26/2019 07:44:07 -------------------------------------------------------------------------------- Compression Therapy Details Patient Name: Date of Service: [name], [name] 11/26/2019 7:30 AM Medical Record [id number] Patient Account Number: [id number] Date of Birth/Sex: Treating RN: 1986/09/04 (34 y.o. [name] Primary Care Naidelin Gugliotta: [place], [place] Other Clinician: Referring Carolynn Tuley: Treating Shaquel Josephson/Extender:Robson, [name], [name]: 203 Compression Therapy Performed for Wound  Wound #37 Left,Lateral Malleolus Assessment: Performed By: Clinician Levan Hurst, RN Compression Type: Three Layer Post Procedure Diagnosis Same as Pre-procedure Electronic Signature(s) Signed: 11/26/2019 5:01:35 PM By: Levan Hurst RN, BSN Entered By: Levan Hurst on 11/26/2019 08:25:45 -------------------------------------------------------------------------------- Encounter Discharge Information Details Patient Name: Date of Service: [name], [name] 11/26/2019 7:30 AM Medical Record [id number] Patient Account Number: [id number] Date of Birth/Sex: Treating RN: June 16, 1986 (34 y.o. [name] Primary Care Meleane Selinger: [name], [name] Other Clinician: Referring Audyn Dimercurio: Treating Mickael Mcnutt/Extender:Robson, [name], [name]: 203 Encounter Discharge Information Items Post Procedure Vitals Discharge Condition: Stable Temperature (F): 98.5 Ambulatory Status: Wheelchair Pulse (bpm): 99 Discharge Destination: Home Respiratory Rate (breaths/min): 16 Transportation: Private Auto Blood Pressure (mmHg): 130/76 Accompanied By: self Schedule Follow-up Appointment: Yes Clinical Summary of Care: Electronic Signature(s) Signed: 11/26/2019 4:44:32 PM By: Deon Pilling Entered By: Deon Pilling on 11/26/2019 08:44:13 -------------------------------------------------------------------------------- Lower Extremity Assessment Details Patient Name: Date of Service: [name] 11/26/2019 7:30 AM Medical Record [id number] Patient Account Number: [id number] Date of Birth/Sex: Treating RN: Feb 05, 1986 (34 y.o. [name] Primary Care [employer] Crago: [name], [name] Other Clinician: Referring Pace Lamadrid: Treating Sereen Schaff/Extender:Robson, [name], [name]: 203 Edema Assessment Assessed: [Left: Yes] [Right: Yes] Edema: [Left: Yes] [Right: No] Calf Left: Right: Point of Measurement: 33 cm From Medial Instep 33 cm 33 cm Ankle Left:  Right: Point of Measurement: 10 cm From Medial Instep 27 cm 23 cm Electronic Signature(s) Signed: 11/26/2019 4:44:32 PM By: Deon Pilling Entered By: Deon Pilling on 11/26/2019 07:48:12 -------------------------------------------------------------------------------- Multi Wound Chart Details Patient Name: Date of Service: [name], [name] 11/26/2019 7:30 AM Medical Record [id number] Patient Account Number: [id number] Date of Birth/Sex: Treating RN: 1986/09/07 (34 y.o. M) Primary Care Daleah Coulson: [name], [name] Other Clinician: Referring Zhanae Proffit: Treating Hinley Brimage/Extender:Robson, [name], [name]: 203 Vital Signs Height(in): 70 Pulse(bpm): 99 Weight(lbs): 216 Blood Pressure(mmHg): 130/76 Body Mass Index(BMI): 31 Temperature(F): 98.5 Respiratory 16 Rate(breaths/min): Photos: [17:No Photos] [24:No Photos] [35:No Photos] Wound  Location: [17:Right Toe - Web between Left Foot - Dorsal 1st and 2nd - Dorsal] [35:Left Ischium] Wounding Event: [17:Gradually Appeared] [24:Gradually Appeared] [35:Gradually Appeared] Primary Etiology: [17:Atypical] [24:Inflammatory] [35:Pressure Ulcer] Comorbid History: [17:Sleep Apnea, Hypertension, Sleep Apnea, Hypertension, Sleep Apnea, Hypertension, Paraplegia] [24:Paraplegia] [35:Paraplegia] Date Acquired: [17:04/07/2017] [24:03/08/2018] [35:04/28/2019] Weeks of Treatment: [id number] [24:89] [35:30] Wound Status: [17:Healed - Epithelialized] [24:Open] [35:Open] Clustered Wound: [17:Yes] [24:Yes] [35:Yes] Clustered Quantity: [17:N/A] [24:2] [35:1] Measurements L x W x D 0x0x0 [24:3x3.5x0.4] [35:3x0.9x0.1] (cm) Area (cm) : [17:0] [24:8.247] [35:2.121] Volume (cm) : [17:0] [24:3.299] [35:0.212] % Reduction in Area: [17:100.00%] [24:-3394.50%] [35:85.00%] % Reduction in Volume: 100.00% [24:-13645.80%] [35:85.00%] Classification: [17:Full Thickness Without Exposed Support Structures Exposed Support Structures] [24:Full Thickness  Without] [35:Category/Stage III] Exudate Amount: [17:None Present] [24:Medium] [35:Medium] Exudate Type: [17:N/A] [24:Purulent] [35:Serosanguineous] Exudate Color: [17:N/A] [24:yellow, brown, green] [35:red, brown] Foul Odor After Cleansing:No [24:Yes] [35:No] Odor Anticipated Due to N/A [24:Yes] [35:N/A] Product Use: Wound Margin: [17:Distinct, outline attached] [24:Distinct, outline attached] [35:Flat and Intact] Granulation Amount: [17:None Present (0%)] [24:Small (1-33%)] [35:Large (67-100%)] Granulation Quality: [17:N/A] [24:Red] [35:Red, Friable] Necrotic Amount: [17:None Present (0%)] [24:Large (67-100%)] [35:None Present (0%)] Exposed Structures: [17:Fascia: No Fat Layer (Subcutaneous Tissue) Exposed: No Tendon: No Muscle: No Joint: No Bone: No] [24:Fat Layer (Subcutaneous Tissue) Exposed: Yes Fascia: No Tendon: No Muscle: No Joint: No Bone: No] [35:Fat Layer (Subcutaneous Tissue) Exposed: Yes  Fascia: No Tendon: No Muscle: No Joint: No Bone: No] Epithelialization: [17:Large (67-100%)] [24:Small (1-33%)] [35:Small (1-33%)] Debridement: [17:N/A] [24:N/A] [35:Debridement - Excisional] Pre-procedure [17:N/A] [24:N/A] [35:08:22] Verification/Time Out Taken: Tissue Debrided: [17:N/A] [24:N/A] [35:Subcutaneous] Level: [17:N/A] [24:N/A] [35:Skin/Subcutaneous Tissue] Debridement Area (sq cm):N/A [24:N/A] [35:2.7] Instrument: [17:N/A] [24:N/A] [35:Curette] Bleeding: [17:N/A] [24:N/A] [35:Moderate] Hemostasis Achieved: [17:N/A] [24:N/A] [35:Silver Nitrate] Procedural Pain: [17:N/A] [24:N/A] [35:0] Post Procedural Pain: [17:N/A] [24:N/A] [35:0] Debridement Treatment N/A [24:N/A] [35:Procedure was tolerated] Response: [35:well] Post Debridement [17:N/A] [24:N/A] [35:3x0.9x0.1] Measurements L x W x D (cm) Post Debridement [17:N/A] [24:N/A] [35:0.212] Volume: (cm) Post Debridement Stage: N/A [24:N/A] [35:Category/Stage III] Procedures Performed: N/A [17:37] [24:N/A] [35:Debridement  N/A] Photos: [17:No Photos] [24:N/A] [35:N/A] Wound Location: [17:Left Malleolus - Lateral] [24:N/A] [35:N/A] Wounding Event: [17:Gradually Appeared] [24:N/A] [35:N/A] Primary Etiology: [17:Venous Leg Ulcer] [24:N/A] [35:N/A] Comorbid History: [17:Sleep Apnea, Hypertension, N/A Paraplegia] [35:N/A] Date Acquired: [17:11/26/2019] [24:N/A] [35:N/A] Weeks of Treatment: [17:0] [24:N/A] [35:N/A] Wound Status: [17:Open] [24:N/A] [35:N/A] Clustered Wound: [17:No] [24:N/A] [35:N/A] Clustered Quantity: [17:N/A] [24:N/A] [35:N/A] Measurements L x W x D 3.6x1.4x0.1 [24:N/A] [35:N/A] (cm) Area (cm) : [17:3.958] [24:N/A] [35:N/A] Volume (cm) : [17:0.396] [24:N/A] [35:N/A] % Reduction in Area: [17:0.00%] [24:N/A] [35:N/A] % Reduction in Volume: 0.00% [24:N/A] [35:N/A] Classification: [17:Full Thickness Without Exposed Support Structures] [24:N/A] [35:N/A] Exudate Amount: [17:Medium] [24:N/A] [35:N/A] Exudate Type: [17:Serosanguineous] [24:N/A] [35:N/A] Exudate Color: [17:red, brown] [24:N/A] [35:N/A] Foul Odor After Cleansing:No [24:N/A N/A] Odor Anticipated Due to N/A [24:N/A N/A] Product Use: Wound Margin: [17:Distinct, outline attached] [24:N/A N/A] Granulation Amount: [17:Large (67-100%)] [24:N/A N/A] Granulation Quality: [17:Pink] [24:N/A N/A] Necrotic Amount: [17:Small (1-33%)] [24:N/A N/A] Exposed Structures: [17:Fat Layer (Subcutaneous Tissue) Exposed: Yes Fascia: No Tendon: No Muscle: No Joint: No Bone: No] [24:N/A N/A] Epithelialization: [17:None] [24:N/A N/A] Debridement: [17:N/A] [24:N/A N/A] Tissue Debrided: [17:N/A] [24:N/A N/A] Level: [17:N/A] [24:N/A N/A] Debridement Area (sq cm):N/A [24:N/A N/A] Instrument: [17:N/A] [24:N/A N/A] Bleeding: [17:N/A] [24:N/A N/A] Hemostasis Achieved: [17:N/A] [24:N/A N/A] Procedural Pain: [17:N/A] [24:N/A N/A] Post Procedural Pain: [17:N/A] [24:N/A N/A] Debridement Treatment N/A [24:N/A N/A] Response: Post Debridement [17:N/A] [24:N/A  N/A] Measurements L x W x D (cm) Post Debridement [17:N/A] [24:N/A  N/A] Volume: (cm) Post Debridement Stage: N/A [24:N/A N/A N/A N/A] Treatment Notes Wound #24 (Left, Dorsal Foot) 1. Cleanse With Wound Cleanser Soap and water 2. Periwound Care Antifungal cream Moisturizing lotion TCA Cream 3. Primary Dressing Applied Other primary dressing (specifiy in notes) 4. Secondary Dressing Dry Gauze Kerramax/Xtrasorb 6. Support Layer Applied 3 layer compression wrap Notes primary dressing cutimed sorbact swab. Wound #35 (Left Ischium) 1. Cleanse With Wound Cleanser 3. Primary Dressing Applied Calcium Alginate Ag 4. Secondary Dressing Foam Border Dressing 5. Secured With Self Adhesive Bandage Wound #37 (Left, Lateral Malleolus) 1. Cleanse With Wound Cleanser Soap and water 2. Periwound Care Moisturizing lotion TCA Cream 3. Primary Dressing Applied Calcium Alginate Ag 4. Secondary Dressing Dry Gauze 6. Support Layer Applied 3 layer compression Water quality scientist) Signed: 11/26/2019 4:56:38 PM By: Linton Ham MD Entered By: Linton Ham on 11/26/2019 08:45:04 -------------------------------------------------------------------------------- Multi-Disciplinary Care Plan Details Patient Name: Date of Service: Sider, [name] 11/26/2019 7:30 AM Medical Record [id number] Patient Account Number: [id number] Date of Birth/Sex: Treating RN: [birth year] (34 y.o. [name] Primary Care Vinny Taranto: [name], [name] Other Clinician: Referring Breda Bond: Treating Jousha Schwandt/Extender:Robson, [name], [name]: 203 Active Inactive Wound/Skin Impairment Nursing Diagnoses: Impaired tissue integrity Knowledge deficit related to ulceration/compromised skin integrity Goals: Patient/caregiver will verbalize understanding of skin care regimen Date Initiated: 01/05/2016 Target Resolution Date: 12/21/2019 Goal Status: Active Ulcer/skin breakdown  will have a volume reduction of 30% by week 4 Date Initiated: 01/05/2016 Date Inactivated: 12/22/2017 Target Resolution Date: 01/19/2018 Unmet Reason: complex Goal Status: Unmet wounds, infection Interventions: Assess patient/caregiver ability to obtain necessary supplies Assess ulceration(s) every visit Provide education on ulcer and skin care Notes: Electronic Signature(s) Signed: 11/26/2019 5:01:35 PM By: Levan Hurst RN, BSN Entered By: Levan Hurst on 11/26/2019 07:42:04 -------------------------------------------------------------------------------- Pain Assessment Details Patient Name: Date of Service: [name], [name] 11/26/2019 7:30 AM Medical Record [id number] Patient Account Number: [id number] Date of Birth/Sex: Treating RN: Apr 09, 1986 (34 y.o. [name] Primary Care Jontae Adebayo: [place], [place] Other Clinician: Referring Lashonda Sonneborn: Treating Mardelle Pandolfi/Extender:Robson, [name], [name]: 203 Active Problems Location of Pain Severity and Description of Pain Patient Has Paino No Site Locations Rate the pain. Current Pain Level: 0 Pain Management and Medication Current Pain Management: Medication: No Cold Application: No Rest: No Massage: No Activity: No T.FerrellN.S.: No Heat Application: No Leg drop or elevation: No Is the Current Pain Management Adequate: Adequate How does your wound impact your activities of daily livingo Sleep: No Bathing: No Appetite: No Relationship With Others: No Bladder Continence: No Emotions: No Bowel Continence: No Work: No Toileting: No Drive: No Dressing: No Hobbies: No Electronic Signature(s) Signed: 11/26/2019 4:44:32 PM By: Deon Pilling Entered By: Deon Pilling on 11/26/2019 07:44:21 -------------------------------------------------------------------------------- Patient/Caregiver Education Details Patient Name: Date of Service: [name] 1/4/2021andnbsp7:30 AM Medical Record  [id number] Patient Account Number: [id number] Date of Birth/Gender: [birth year] (33 y.o. M) Treating RN: Levan Hurst Primary Care Physician: Janine Limbo Other Clinician: Referring Physician: Treating Physician/Extender:Robson, Pecola Leisure in Treatment: 203 Education Assessment Education Provided To: Patient Education Topics Provided Wound/Skin Impairment: Methods: Explain/Verbal Responses: State content correctly Electronic Signature(s) Signed: 11/26/2019 5:01:35 PM By: Levan Hurst RN, BSN Entered By: Levan Hurst on 11/26/2019 07:42:58 -------------------------------------------------------------------------------- Wound Assessment Details Patient Name: Date of Service: [name], [name] 11/26/2019 7:30 AM Medical Record [id number] Patient Account Number: [id number] Date of Birth/Sex: Treating RN: February 07, 1986 (34 y.o. [name] Primary Care Trindon Dorton: [name], [name] Other Clinician: Referring Jahlon Baines: Treating Darrin Koman/Extender:Robson, Legrand Como  [name], [name]: 203 Wound Status Wound Number: 17 Primary Etiology: Atypical Wound Location: Right Toe - Web between 1st and Wound Status: Healed - Epithelialized 2nd - Dorsal Comorbid Sleep Apnea, Hypertension, Wounding Event: Gradually Appeared History: Paraplegia Date Acquired: 04/07/2017 Weeks Of Treatment: 137 Clustered Wound: Yes Photos Wound Measurements Length: (cm) 0 % Reduct Width: (cm) 0 % Reduct Depth: (cm) 0 Epitheli Area: (cm) 0 Tunneli Volume: (cm) 0 Undermi Wound Description Classification: Full Thickness Without Exposed Support Foul Odo Structures Slough/F Wound Distinct, outline attached Margin: Exudate None Present Amount: Wound Bed Granulation Amount: None Present (0%) Necrotic Amount: None Present (0%) Fascia E Fat Laye Tendon E Muscle E Joint Ex Bone Exp r After Cleansing: No ibrino No Exposed Structure xposed: No r (Subcutaneous  Tissue) Exposed: No xposed: No xposed: No posed: No osed: No ion in Area: 100% ion in Volume: 100% alization: Large (67-100%) ng: No ning: No Electronic Signature(s) Signed: 11/27/2019 3:29:37 PM By: Mikeal Hawthorne EMT/HBOT Signed: 11/28/2019 5:41:11 PM By: Deon Pilling Previous Signature: 11/26/2019 4:44:32 PM Version By: Deon Pilling Previous Signature: 11/26/2019 5:01:35 PM Version By: Levan Hurst RN, BSN Entered By: Mikeal Hawthorne on 11/27/2019 13:22:28 -------------------------------------------------------------------------------- Wound Assessment Details Patient Name: Date of Service: [name], [name] 11/26/2019 7:30 AM Medical Record [id number] Patient Account Number: [id number] Date of Birth/Sex: Treating RN: [birth year] (34 y.o. [name] Primary Care Sharaya Boruff: [name], [name] Other Clinician: Referring Warnell Rasnic: Treating Lauriann Milillo/Extender:Robson, [name], [name]: 203 Wound Status Wound Number: 24 Primary Etiology: Inflammatory Wound Location: Left Foot - Dorsal Wound Status: Open Wounding Event: Gradually Appeared Comorbid Sleep Apnea, Hypertension, History: Paraplegia Date Acquired: 03/08/2018 Weeks Of Treatment: 89 Clustered Wound: Yes Photos Wound Measurements Length: (cm) 3 % Reduct Width: (cm) 3.5 % Reduct Depth: (cm) 0.4 Epitheli Clustered Quantity: 2 Tunnelin Area: (cm) 8.247 Undermi Volume: (cm) 3.299 Wound Description Classification: Full Thickness Without Exposed Support Foul Odo Structures Due to P Wound Slough/F Distinct, outline attached Margin: Exudate Medium Amount: Exudate Purulent Type: Exudate yellow, brown, green Color: Wound Bed Granulation Amount: Small (1-33%) Granulation Quality: Red Fascia E Necrotic Amount: Large (67-100%) Fat Laye Necrotic Quality: Adherent Slough Tendon E Muscle E Joint Ex Bone Exp r After Cleansing: Yes roduct Use: Yes ibrino Yes Exposed Structure xposed: No r  (Subcutaneous Tissue) Exposed: Yes xposed: No xposed: No posed: No osed: No ion in Area: -3394.5% ion in Volume: -13645.8% alization: Small (1-33%) g: No ning: No Treatment Notes Wound #24 (Left, Dorsal Foot) 1. Cleanse With Wound Cleanser Soap and water 2. Periwound Care Antifungal cream Moisturizing lotion TCA Cream 3. Primary Dressing Applied Other primary dressing (specifiy in notes) 4. Secondary Dressing Dry Gauze Kerramax/Xtrasorb 6. Support Layer Applied 3 layer compression wrap Notes primary dressing cutimed sorbact swab. Electronic Signature(s) Signed: 11/27/2019 3:29:37 PM By: Mikeal Hawthorne EMT/HBOT Signed: 11/28/2019 5:41:11 PM By: Deon Pilling Previous Signature: 11/26/2019 4:44:32 PM Version By: Deon Pilling Entered By: Mikeal Hawthorne on 11/27/2019 13:23:07 -------------------------------------------------------------------------------- Wound Assessment Details Patient Name: Date of Service: [name], [name] 11/26/2019 7:30 AM Medical Record [id number] Patient Account Number: [id number] Date of Birth/Sex: Treating RN: September 17, 1986 (34 y.o. [name] Primary Care Jhalil Silvera: [place], [place] Other Clinician: Referring Elianie Hubers: Treating Jamine Highfill/Extender:Robson, [name], [name]: 203 Wound Status Wound Number: 35 Primary Etiology: Pressure Ulcer Wound Location: Left Ischium Wound Status: Open Wounding Event: Gradually Appeared Comorbid Sleep Apnea, Hypertension, History: Paraplegia Date Acquired: 04/28/2019 Weeks Of Treatment: 30 Clustered Wound: Yes Photos Wound Measurements Length: (  cm) 3 Width: (cm) 0.9 Depth: (cm) 0.1 Clustered Quantity: 1 Area: (cm) 2.121 Volume: (cm) 0.212 Wound Description Classification: Category/Stage III Wound Margin: Flat and Intact Exudate Amount: Medium Exudate Type: Serosanguineous Exudate Color: red, brown Wound Bed Granulation Amount: Large (67-100%) Granulation Quality: Red,  Friable Necrotic Amount: None Present (0%) Foul Odor After Cleansing: N Slough/Fibrino N Exposed Structure Fascia Exposed: N Fat Layer (Subcutaneous Tissue) Exposed: Y Tendon Exposed: N Muscle Exposed: N Joint Exposed: N Bone Exposed: N % Reduction in Area: 85% % Reduction in Volume: 85% Epithelialization: Small (1-33%) Undermining: No o o o es o o o o Treatment Notes Wound #35 (Left Ischium) 1. Cleanse With Wound Cleanser 3. Primary Dressing Applied Calcium Alginate Ag 4. Secondary Dressing Foam Border Dressing 5. Secured With Office manager) Signed: 11/27/2019 3:29:37 PM By: Mikeal Hawthorne EMT/HBOT Signed: 11/28/2019 5:41:11 PM By: Deon Pilling Previous Signature: 11/26/2019 4:44:32 PM Version By: Deon Pilling Entered By: Mikeal Hawthorne on 11/27/2019 13:23:39 -------------------------------------------------------------------------------- Wound Assessment Details Patient Name: Date of Service: [name], [name] 11/26/2019 7:30 AM Medical Record [id number] Patient Account Number: [id number] Date of Birth/Sex: Treating RN: 25-Nov-1985 (34 y.o. [name] Primary Care Journii Nierman: [place], [place] Other Clinician: Referring Baylyn Sickles: Treating Markos Theil/Extender:Robson, [name], [name]: 203 Wound Status Wound Number: 37 Primary Etiology: Venous Leg Ulcer Wound Location: Left Malleolus - Lateral Wound Status: Open Wounding Event: Gradually Appeared Comorbid Sleep Apnea, Hypertension, History: Paraplegia Date Acquired: 11/26/2019 Weeks Of Treatment: 0 Clustered Wound: No Photos Wound Measurements Length: (cm) 3.6 % Reduct Width: (cm) 1.4 % Reduct Depth: (cm) 0.1 Epitheli Area: (cm) 3.958 Tunneli Volume: (cm) 0.396 Undermi Wound Description Classification: Full Thickness Without Exposed Support Foul Odo Structures Slough/F Wound Distinct, outline attached Margin: Exudate  Medium Amount: Exudate Serosanguineous Type: Exudate red, brown Color: Wound Bed Granulation Amount: Large (67-100%) Granulation Quality: Pink Fascia E Necrotic Amount: Small (1-33%) Fat Laye Necrotic Quality: Adherent Slough Tendon E Muscle E Joint Ex Bone Exp r After Cleansing: No ibrino Yes Exposed Structure xposed: No r (Subcutaneous Tissue) Exposed: Yes xposed: No xposed: No posed: No osed: No ion in Area: 0% ion in Volume: 0% alization: None ng: No ning: No Treatment Notes Wound #37 (Left, Lateral Malleolus) 1. Cleanse With Wound Cleanser Soap and water 2. Periwound Care Moisturizing lotion TCA Cream 3. Primary Dressing Applied Calcium Alginate Ag 4. Secondary Dressing Dry Gauze 6. Support Layer Applied 3 layer compression wrap Electronic Signature(s) Signed: 11/27/2019 3:29:37 PM By: Mikeal Hawthorne EMT/HBOT Signed: 11/28/2019 5:41:11 PM By: Deon Pilling Previous Signature: 11/26/2019 4:44:32 PM Version By: Deon Pilling Previous Signature: 11/26/2019 5:01:35 PM Version By: Levan Hurst RN, BSN Previous Signature: 11/26/2019 5:01:35 PM Version By: Levan Hurst RN, BSN Entered By: Mikeal Hawthorne on 11/27/2019 13:24:46 -------------------------------------------------------------------------------- [place] Details Patient Name: Date of Service: Tworek, Kennth E. 11/26/2019 7:30 AM Medical Record [id number] Patient Account Number: [id number] Date of Birth/Sex: Treating RN: 12-Oct-1986 (34 y.o. [name] Primary Care Kashten Gowin: [name], [name] Other Clinician: Referring Zakery Normington: Treating Blakelyn Dinges/Extender:Robson, [name], [name]: 203 Vital Signs Time Taken: 07:44 Temperature (F): 98.5 Height (in): 70 Pulse (bpm): 99 Weight (lbs): 216 Respiratory Rate (breaths/min): 16 Body Mass Index (BMI): 31 Blood Pressure (mmHg): 130/76 Reference Range: 80 - 120 mg / dl Electronic Signature(s) Signed: 11/26/2019 4:44:32 PM By:  Deon Pilling Entered By: Deon Pilling on 11/26/2019 07:44:58

## 2019-11-27 ENCOUNTER — Other Ambulatory Visit: Payer: Self-pay | Admitting: Internal Medicine

## 2019-11-27 ENCOUNTER — Other Ambulatory Visit (HOSPITAL_COMMUNITY): Payer: Self-pay | Admitting: Internal Medicine

## 2019-11-27 DIAGNOSIS — L97521 Non-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: Secondary | ICD-10-CM

## 2019-11-29 DIAGNOSIS — L97922 Non-pressure chronic ulcer of unspecified part of left lower leg with fat layer exposed: Secondary | ICD-10-CM | POA: Diagnosis not present

## 2019-11-29 DIAGNOSIS — F418 Other specified anxiety disorders: Secondary | ICD-10-CM | POA: Diagnosis not present

## 2019-11-29 DIAGNOSIS — F840 Autistic disorder: Secondary | ICD-10-CM | POA: Diagnosis not present

## 2019-11-29 DIAGNOSIS — F902 Attention-deficit hyperactivity disorder, combined type: Secondary | ICD-10-CM | POA: Diagnosis not present

## 2019-11-29 DIAGNOSIS — Z6827 Body mass index (BMI) 27.0-27.9, adult: Secondary | ICD-10-CM | POA: Diagnosis not present

## 2019-12-03 ENCOUNTER — Other Ambulatory Visit: Payer: Self-pay

## 2019-12-03 ENCOUNTER — Encounter (HOSPITAL_BASED_OUTPATIENT_CLINIC_OR_DEPARTMENT_OTHER): Payer: BC Managed Care – PPO | Admitting: Internal Medicine

## 2019-12-03 DIAGNOSIS — Z8614 Personal history of Methicillin resistant Staphylococcus aureus infection: Secondary | ICD-10-CM | POA: Diagnosis not present

## 2019-12-03 DIAGNOSIS — L97512 Non-pressure chronic ulcer of other part of right foot with fat layer exposed: Secondary | ICD-10-CM | POA: Diagnosis not present

## 2019-12-03 DIAGNOSIS — G8221 Paraplegia, complete: Secondary | ICD-10-CM | POA: Diagnosis not present

## 2019-12-03 DIAGNOSIS — L97522 Non-pressure chronic ulcer of other part of left foot with fat layer exposed: Secondary | ICD-10-CM | POA: Diagnosis not present

## 2019-12-03 DIAGNOSIS — L98492 Non-pressure chronic ulcer of skin of other sites with fat layer exposed: Secondary | ICD-10-CM | POA: Diagnosis not present

## 2019-12-03 DIAGNOSIS — L97322 Non-pressure chronic ulcer of left 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: Secondary | ICD-10-CM | POA: Diagnosis not present

## 2019-12-03 DIAGNOSIS — Z881 Allergy status to other antibiotic agents status: Secondary | ICD-10-CM | POA: Diagnosis not present

## 2019-12-03 DIAGNOSIS — I872 Venous insufficiency (chronic) (peripheral): Secondary | ICD-10-CM | POA: Diagnosis not present

## 2019-12-03 DIAGNOSIS — Z888 Allergy status to other drugs, medicaments and biological substances status: Secondary | ICD-10-CM | POA: Diagnosis not present

## 2019-12-03 DIAGNOSIS — L03116 Cellulitis of left lower limb: Secondary | ICD-10-CM | POA: Diagnosis not present

## 2019-12-03 DIAGNOSIS — Z87891 Personal history of nicotine dependence: Secondary | ICD-10-CM | POA: Diagnosis not present

## 2019-12-03 DIAGNOSIS — L89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, stage 3: Secondary | ICD-10-CM | POA: Diagnosis not present

## 2019-12-03 DIAGNOSIS — Z882 Allergy status to sulfonamides status: Secondary | ICD-10-CM | POA: Diagnosis not present

## 2019-12-03 DIAGNOSIS — Z880 Allergy status to penicillin: Secondary | ICD-10-CM | POA: Diagnosis not present

## 2019-12-04 ENCOUNTER — Other Ambulatory Visit: Payer: Self-pay

## 2019-12-04 DIAGNOSIS — S81802A Unspecified open wound, left lower leg, initial encounter: Secondary | ICD-10-CM

## 2019-12-04 NOTE — Progress Notes (Signed)
Lamson, [name] ([id number]) Visit Report for 12/03/2019 Arrival Information Details Patient Name: Date of Service: [name], [name] 12/03/2019 7:30 AM Medical Record [id number] Patient Account Number: [id number] Date of Birth/Sex: Treating RN: 1986/10/18 (34 y.o. [name]) Carlene Coria Primary Care Gracen Ringwald: [name], [name] Other Clinician: Referring Duel Conrad: Treating Koby Pickup/Extender:Robson, [name], [name]: 204 Visit Information History Since Last Visit All ordered tests and consults were completed: No Patient Arrived: Wheel Chair Added or deleted any medications: No Arrival Time: 07:50 Any new allergies or adverse reactions: No Accompanied By: self Had a fall or experienced change in No activities of daily living that may affect Transfer Assistance: None risk of falls: Patient Identification Verified: Yes Signs or symptoms of abuse/neglect since last No Secondary Verification Process Completed: Yes visito Patient Requires Transmission-Based No Hospitalized since last visit: No Precautions: Implantable device outside of the clinic excluding No Patient Has Alerts: Yes cellular tissue based products placed in the center Patient Alerts: R ABI = since last visit: 1.0 Has Dressing in Place as Prescribed: Yes L ABI = Has Compression in Place as Prescribed: Yes 1.1 Pain Present Now: No Electronic Signature(s) Signed: 12/03/2019 6:08:38 PM By: Carlene Coria RN Entered By: Carlene Coria on 12/03/2019 07:52:20 -------------------------------------------------------------------------------- Compression Therapy Details Patient Name: Date of Service: [name], Grace [name]. 12/03/2019 7:30 AM Medical Record [id number] Patient Account Number: [id number] Date of Birth/Sex: Treating RN: June 20, 1986 (34 y.o. [name] Primary Care Tyiesha Brackney: [place], [place] Other Clinician: Referring Cashe Gatt: Treating Baxter Gonzalez/Extender:Robson, [name], [name] Weeks in  Treatment: 204 Compression Therapy Performed for Wound Wound #37 Left,Lateral Malleolus Assessment: Performed By: Clinician Levan Hurst, RN Compression Type: Three Layer Post Procedure Diagnosis Same as Pre-procedure Electronic Signature(s) Signed: 12/04/2019 6:18:44 PM By: Levan Hurst RN, BSN Entered By: Levan Hurst on 12/03/2019 08:44:56 -------------------------------------------------------------------------------- Compression Therapy Details Patient Name: Date of Service: [name], [name] 12/03/2019 7:30 AM Medical Record [id number] Patient Account Number: [id number] Date of Birth/Sex: Treating RN: 09-23-1986 (34 y.o. [name] Primary Care Malva Diesing: [place], [place] Other Clinician: Referring Zarah Carbon: Treating Clarabelle Oscarson/Extender:Robson, [name], [name]: 204 Compression Therapy Performed for Wound Wound #24 Left,Dorsal Foot Assessment: Performed By: Clinician Levan Hurst, RN Compression Type: Three Layer Post Procedure Diagnosis Same as Pre-procedure Electronic Signature(s) Signed: 12/04/2019 6:18:44 PM By: Levan Hurst RN, BSN Entered By: Levan Hurst on 12/03/2019 08:44:56 -------------------------------------------------------------------------------- Encounter Discharge Information Details Patient Name: Date of Service: [name], [name] 12/03/2019 7:30 AM Medical Record [id number] Patient Account Number: [id number] Date of Birth/Sex: Treating RN: 05-May-1986 (34 y.o. [name] Primary Care Charline Hoskinson: [place], [place] Other Clinician: Referring Ebon Ketchum: Treating Nyela Cortinas/Extender:Robson, [name], [name]: 204 Encounter Discharge Information Items Discharge Condition: Stable Ambulatory Status: Wheelchair Discharge Destination: Home Transportation: Private Auto Accompanied By: self Schedule Follow-up Appointment: Yes Clinical Summary of Care: Patient Declined Electronic  Signature(s) Signed: 12/04/2019 6:12:59 PM By: Baruch Gouty RN, BSN Entered By: Baruch Gouty on 12/03/2019 09:12:39 -------------------------------------------------------------------------------- Lower Extremity Assessment Details Patient Name: Date of Service: [name], [name] 12/03/2019 7:30 AM Medical Record [id number] Patient Account Number: [id number] Date of Birth/Sex: Treating RN: [birth year] (34 y.o. [name] Primary Care Bethzy Hauck: [place], [place] Other Clinician: Referring Mickell Birdwell: Treating Benno Brensinger/Extender:Robson, [name], [name]: 204 Edema Assessment Assessed: [Left: No] [Right: No] Edema: [Left: Yes] [Right: No] Calf Left: Right: Point of Measurement: 33 cm From Medial Instep 33 cm 34 cm Ankle Left: Right: Point of Measurement: 10 cm From Medial Instep 24 cm 23 cm Electronic Signature(s) Signed:  12/03/2019 6:08:38 PM By: Carlene Coria RN Entered By: Carlene Coria on 12/03/2019 08:20:38 -------------------------------------------------------------------------------- Multi Wound Chart Details Patient Name: Date of Service: [name], [name] 12/03/2019 7:30 AM Medical Record [id number] Patient Account Number: [id number] Date of Birth/Sex: Treating RN: [birth year] (34 y.o. M) Primary Care Kathleene Bergemann: [name], [name] Other Clinician: Referring Sarina Robleto: Treating Annleigh Knueppel/Extender:Robson, [name], [name]: 204 Vital Signs Height(in): 70 Pulse(bpm): 96 Weight(lbs): 216 Blood Pressure(mmHg): 110/64 Body Mass Index(BMI): 31 Temperature(F): 98 Respiratory 18 Rate(breaths/min): Photos: [24:No Photos] [35:No Photos] [37:No Photos] Wound Location: [24:Left Foot - Dorsal] [35:Left Ischium] [37:Left Malleolus - Lateral] Wounding Event: [24:Gradually Appeared] [35:Gradually Appeared] [37:Gradually Appeared] Primary Etiology: [24:Inflammatory] [35:Pressure Ulcer] [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] Comorbid History: [24:Sleep  Apnea, Hypertension, Sleep Apnea, Hypertension, Sleep Apnea, Hypertension, Paraplegia] [35:Paraplegia] [37:Paraplegia] Date Acquired: [24:03/08/2018] [35:04/28/2019] [37:11/26/2019] Weeks of Treatment: [24:90] [35:31] [37:1] Wound Status: [24:Open] [35:Open] [37:Open] Clustered Wound: [24:Yes] [35:Yes] [37:No] Clustered Quantity: [24:2] [35:1] [37:N/A] Measurements L x W x D 3.4x2.4x0.5 [35:3.7x1.7x0.2] [37:3.5x0.5x0.1] (cm) Area (cm) : [24:6.409] [35:4.94] [37:1.374] Volume (cm) : [24:3.204] [35:0.988] [37:0.137] % Reduction in Area: [24:-2615.70%] [35:65.10%] [37:65.30%] % Reduction in Volume: -13250.00% [35:30.10%] [37:65.40%] Classification: [24:Full Thickness Without Exposed Support Structures] [35:Category/Stage III] [37:Full Thickness Without Exposed Support Structures] Exudate Amount: [24:Medium] [35:Medium] [37:Medium] Exudate Type: [24:Serosanguineous] [35:Serosanguineous] [37:Serosanguineous] Exudate Color: [24:red, brown] [35:red, brown] [37:red, brown] Wound Margin: [24:Distinct, outline attached Flat and Intact] [37:Distinct, outline attached] Granulation Amount: [24:Small (1-33%)] [35:Large (67-100%)] [37:Large (67-100%)] Granulation Quality: [24:Red] [35:Red, Friable] [37:Pink] Necrotic Amount: [24:Large (67-100%)] [35:None Present (0%)] [37:Small (1-33%)] Exposed Structures: [24:Fat Layer (Subcutaneous Fat Layer (Subcutaneous Fat Layer (Subcutaneous Tissue) Exposed: Yes Fascia: No Tendon: No Muscle: No Joint: No Bone: No] [35:Tissue) Exposed: Yes Fascia: No Tendon: No Muscle: No Joint: No Bone: No] [37:Tissue) Exposed: Yes  Fascia: No Tendon: No Muscle: No Joint: No Bone: No] Epithelialization: [24:Small (1-33%)] [35:Small (1-33%)] [37:None] Procedures Performed: Compression Therapy [24:38] [35:N/A N/A] [37:Compression Therapy N/A] Photos: [24:No Photos] [35:N/A] [37:N/A] Wound Location: [24:Right Toe - Web between N/A 1st and 2nd] [37:N/A] Wounding Event: [24:Gradually  Appeared] [35:N/A] [37:N/A] Primary Etiology: [24:Lymphedema] [35:N/A] [37:N/A] Comorbid History: [24:Sleep Apnea, Hypertension, N/A Paraplegia] [37:N/A] Date Acquired: [24:11/30/2019] [35:N/A] [37:N/A] Weeks of Treatment: [24:0] [35:N/A] [37:N/A] Wound Status: [24:Open] [35:N/A] [37:N/A] Clustered Wound: [24:No] [35:N/A] [37:N/A] Clustered Quantity: [24:N/A] [35:N/A] [37:N/A] Measurements L x W x D 2.1x0.2x0.7 [35:N/A] [37:N/A] (cm) Area (cm) : [24:0.33] [35:N/A] [37:N/A] Volume (cm) : [24:0.231] [35:N/A] [37:N/A] % Reduction in Area: [24:N/A] [35:N/A] [37:N/A] % Reduction in Volume: [24:N/A] [35:N/A] [37:N/A] Classification: [24:Full Thickness Without Exposed Support Structures] [35:N/A] [37:N/A] Exudate Amount: [24:Medium] [35:N/A] [37:N/A] Exudate Type: [24:Serosanguineous] [35:N/A] [37:N/A] Exudate Color: [24:red, brown] [35:N/A] [37:N/A] Wound Margin: [24:N/A] [35:N/A] [37:N/A] Granulation Amount: [24:Medium (34-66%)] [35:N/A] [37:N/A] Granulation Quality: [24:Pink, Pale] [35:N/A] [37:N/A] Necrotic Amount: [24:Medium (34-66%)] [35:N/A] [37:N/A] Exposed Structures: [24:Fat Layer (Subcutaneous N/A Tissue) Exposed: Yes Fascia: No Tendon: No Muscle: No Joint: No Bone: No] [37:N/A] Epithelialization: [24:None N/A] [35:N/A N/A] [37:N/A N/A] Treatment Notes Electronic Signature(s) Signed: 12/03/2019 6:04:26 PM By: Linton Ham MD Entered By: Linton Ham on 12/03/2019 08:57:38 -------------------------------------------------------------------------------- Multi-Disciplinary Care Plan Details Patient Name: Date of Service: [name]. 12/03/2019 7:30 AM Medical Record [id number] Patient Account Number: [id number] Date of Birth/Sex: Treating RN: 29-May-1986 (34 y.o. [name] Primary Care Skylor Hughson: [place], [place] Other Clinician: Referring Kysean Sweet: Treating Connee Ikner/Extender:Robson, [name], [name]: 204 Active Inactive Wound/Skin  Impairment Nursing Diagnoses: Impaired tissue integrity Knowledge deficit related to ulceration/compromised skin integrity Goals: Patient/caregiver will verbalize understanding of skin care  regimen Date Initiated: 01/05/2016 Target Resolution Date: 12/21/2019 Goal Status: Active Ulcer/skin breakdown will have a volume reduction of 30% by week 4 Date Initiated: 01/05/2016 Date Inactivated: 12/22/2017 Target Resolution Date: 01/19/2018 Unmet Reason: complex Unmet Reason: complex Goal Status: Unmet wounds, infection Interventions: Assess patient/caregiver ability to obtain necessary supplies Assess ulceration(s) every visit Provide education on ulcer and skin care Notes: Electronic Signature(s) Signed: 12/04/2019 6:18:44 PM By: Levan Hurst RN, BSN Entered By: Levan Hurst on 12/03/2019 08:00:12 -------------------------------------------------------------------------------- Pain Assessment Details Patient Name: Date of Service: Hartwell, [name] 12/03/2019 7:30 AM Medical Record [id number] Patient Account Number: [id number] Date of Birth/Sex: Treating RN: 1986-03-26 (34 y.o. [name] Primary Care Wallie Lagrand: [place], [place] Other Clinician: Referring Tamme Mozingo: Treating Asmaa Tirpak/Extender:Robson, [name], [name]: 204 Active Problems Location of Pain Severity and Description of Pain Patient Has Paino No Site Locations Pain Management and Medication Current Pain Management: Electronic Signature(s) Signed: 12/03/2019 6:08:38 PM By: Carlene Coria RN Entered By: Carlene Coria on 12/03/2019 07:53:33 -------------------------------------------------------------------------------- Patient/Caregiver Education Details Patient Name: Date of Service: [name] 1/11/2021andnbsp7:30 AM Medical Record [phone] Patient Account Number: [id number] Date of Birth/Gender: [birth year] (33 y.o. M) Treating RN: Levan Hurst Primary Care Physician: Janine Limbo Other Clinician: Referring Physician: Treating Physician/Extender:Robson, Pecola Leisure in Treatment: 204 Education Assessment Education Provided To: Patient Education Topics Provided Wound/Skin Impairment: Methods: Explain/Verbal Responses: State content correctly Electronic Signature(s) Signed: 12/04/2019 6:18:44 PM By: Levan Hurst RN, BSN Entered By: Levan Hurst on 12/03/2019 08:00:45 -------------------------------------------------------------------------------- Wound Assessment Details Patient Name: Date of Service: Emmanuel, Grace [name]. 12/03/2019 7:30 AM Medical Record [id number] Patient Account Number: [id number] Date of Birth/Sex: Treating RN: 1986/06/06 (34 y.o. [name] Primary Care Loyola Santino: [place], [place] Other Clinician: Referring Maleiya Pergola: Treating Lillias Difrancesco/Extender:Robson, [name], [name]: 204 Wound Status Wound Number: 24 Primary Etiology: Inflammatory Wound Location: Left Foot - Dorsal Wound Status: Open Wounding Event: Gradually Appeared Comorbid Sleep Apnea, Hypertension, History: Paraplegia Date Acquired: 03/08/2018 Weeks Of Treatment: 90 Clustered Wound: Yes Photos Wound Measurements Length: (cm) 3.4 % Reduct Width: (cm) 2.4 % Reduct Depth: (cm) 0.5 Epitheli Clustered Quantity: 2 Tunnelin Area: (cm) 6.409 Undermi Volume: (cm) 3.204 Wound Description Full Thickness Without Exposed Support Foul Odo Classification: Structures Slough/F Wound Distinct, outline attached Margin: Exudate Medium Amount: Exudate Serosanguineous Type: Exudate red, brown Color: Wound Bed Granulation Amount: Small (1-33%) Granulation Quality: Red Fascia E Necrotic Amount: Large (67-100%) Fat Laye Necrotic Quality: Adherent Slough Tendon E Muscle E Joint Ex Bone Exp r After Cleansing: No ibrino Yes Exposed Structure xposed: No r (Subcutaneous Tissue) Exposed: Yes xposed: No xposed: No posed:  No osed: No ion in Area: -2615.7% ion in Volume: -13250% alization: Small (1-33%) g: No ning: No Treatment Notes Wound #24 (Left, Dorsal Foot) 2. Periwound Care Antifungal cream TCA Cream 3. Primary Dressing Applied Other primary dressing (specifiy in notes) 4. Secondary Dressing Dry Gauze 6. Support Layer Applied 3 layer compression wrap Notes primary dressing cutimed sorbact swab. Electronic Signature(s) Signed: 12/04/2019 3:22:35 PM By: Mikeal Hawthorne EMT/HBOT Signed: 12/04/2019 6:18:05 PM By: Carlene Coria RN Previous Signature: 12/03/2019 6:08:38 PM Version By: Carlene Coria RN Entered By: Mikeal Hawthorne on 12/04/2019 13:48:47 -------------------------------------------------------------------------------- Wound Assessment Details Patient Name: Date of Service: Fedele, [name] 12/03/2019 7:30 AM Medical Record [id number] Patient Account Number: [id number] Date of Birth/Sex: Treating RN: [birth year] (35 y.o. [name] Primary Care Gurpreet Mikhail: [place], [place] Other Clinician: Referring Sommer Spickard: Treating Jahnyla Parrillo/Extender:Robson, [name], [name]: 204 Wound  Status Wound Number: 35 Primary Etiology: Pressure Ulcer Wound Location: Left Ischium Wound Status: Open Wounding Event: Gradually Appeared Comorbid Sleep Apnea, Hypertension, History: Paraplegia Date Acquired: 04/28/2019 Weeks Of Treatment: 31 Clustered Wound: Yes Photos Wound Measurements Length: (cm) 3.7 Width: (cm) 1.7 Depth: (cm) 0.2 Clustered Quantity: 1 Area: (cm) 4.94 Volume: (cm) 0.988 Wound Description Classification: Category/Stage III Wound Margin: Flat and Intact Exudate Amount: Medium Exudate Type: Serosanguineous Exudate Color: red, brown Wound Bed Granulation Amount: Large (67-100%) Granulation Quality: Red, Friable Necrotic Amount: None Present (0%) Treatment Notes Wound #35 (Left Ischium) 2. Periwound Care Barrier cream 3. Primary Dressing  Applied Calcium Alginate 4. Secondary Dressing Foam Border Dressing fter Cleansing: No ino No Exposed Structure ed: No ubcutaneous Tissue) Exposed: Yes ed: No ed: No d: No : No % Reduction in Area: 65.1% % Reduction in Volume: 30.1% Epithelialization: Small (1-33%) Tunneling: No Undermining: No Foul Odor A Slough/Fibr Fascia Expos Fat Layer (S Tendon Expos Muscle Expos Joint Expose Bone Exposed Electronic Signature(s) Signed: 12/04/2019 3:22:35 PM By: Mikeal Hawthorne EMT/HBOT Signed: 12/04/2019 6:18:05 PM By: Carlene Coria RN Previous Signature: 12/03/2019 6:08:38 PM Version By: Carlene Coria RN Entered By: Mikeal Hawthorne on 12/04/2019 13:51:15 -------------------------------------------------------------------------------- Wound Assessment Details Patient Name: Date of Service: Treloar, [name] 12/03/2019 7:30 AM Medical Record [id number] Patient Account Number: [id number] Date of Birth/Sex: Treating RN: 11-12-1986 (34 y.o. [name] Primary Care Eustacio Ellen: [place], [place] Other Clinician: Referring Adalaya Irion: Treating Madesyn Ast/Extender:Robson, [name], [name]: 204 Wound Status Wound Number: 37 Primary Etiology: Venous Leg Ulcer Wound Location: Left Malleolus - Lateral Wound Status: Open Wounding Event: Gradually Appeared Comorbid Sleep Apnea, Hypertension, History: Paraplegia Date Acquired: 11/26/2019 Weeks Of Treatment: 1 Clustered Wound: No Photos Wound Measurements Length: (cm) 3.5 % Reducti Width: (cm) 0.5 % Reducti Depth: (cm) 0.1 Epithelia Area: (cm) 1.374 Tunnelin Volume: (cm) 0.137 Undermin Wound Description Classification: Full Thickness Without Exposed Support Foul Odo Structures Slough/F Wound Distinct, outline attached Margin: Exudate Medium Amount: Exudate Serosanguineous Type: Exudate red, brown Color: Wound Bed Granulation Amount: Large (67-100%) Granulation Quality: Pink Fascia E Necrotic Amount:  Small (1-33%) Fat Laye Necrotic Quality: Adherent Slough Tendon E Muscle E Joint Ex Bone Exp r After Cleansing: No ibrino Yes Exposed Structure xposed: No r (Subcutaneous Tissue) Exposed: Yes xposed: No xposed: No posed: No osed: No on in Area: 65.3% on in Volume: 65.4% lization: None g: No ing: No Treatment Notes Wound #37 (Left, Lateral Malleolus) 2. Periwound Care Moisturizing lotion TCA Cream 3. Primary Dressing Applied Calcium Alginate 4. Secondary Dressing Dry Gauze 6. Support Layer Applied 3 layer compression wrap Electronic Signature(s) Signed: 12/04/2019 3:22:35 PM By: Mikeal Hawthorne EMT/HBOT Signed: 12/04/2019 6:18:05 PM By: Carlene Coria RN Previous Signature: 12/03/2019 6:08:38 PM Version By: Carlene Coria RN Entered By: Mikeal Hawthorne on 12/04/2019 13:48:06 -------------------------------------------------------------------------------- Wound Assessment Details Patient Name: Date of Service: Fickel, [name] 12/03/2019 7:30 AM Medical Record [id number] Patient Account Number: [id number] Date of Birth/Sex: Treating RN: [birth year] (35 y.o. [name] Primary Care Tylin Force: [place], [place] Other Clinician: Referring Kanon Novosel: Treating Joeanna Howdyshell/Extender:Robson, [name], [name]: 204 Wound Status Wound Number: 38 Primary Etiology: Inflammatory Wound Location: Right Toe - Web between 1st and Wound Status: Open 2nd Comorbid Sleep Apnea, Hypertension, Wounding Event: Gradually Appeared History: Paraplegia Date Acquired: 11/30/2019 Weeks Of Treatment: 0 Clustered Wound: No Photos Wound Measurements Length: (cm) 2.1 % Reduct Width: (cm) 0.2 % Reduct Depth: (cm) 0.7 Epitheli Area: (cm) 0.33 Tunneli Volume: (cm) 0.231 Undermi Wound  Description Full Thickness Without Exposed Support Foul Odo Classification: Structures Slough/F Exudate Medium Amount: Exudate Serosanguineous Type: Exudate red, brown Color: Wound  Bed Granulation Amount: Medium (34-66%) Granulation Quality: Pink, Pale Fascia E Necrotic Amount: Medium (34-66%) Fat Laye Necrotic Quality: Adherent Slough Tendon E Muscle E Joint Ex Bone Exp r After Cleansing: No ibrino Yes Exposed Structure xposed: No r (Subcutaneous Tissue) Exposed: Yes xposed: No xposed: No posed: No osed: No ion in Area: 0% ion in Volume: 0% alization: None ng: No ning: No Treatment Notes Wound #38 (Right Toe - Web between 1st and 2nd) 2. Periwound Care Antifungal cream 3. Primary Dressing Applied Calcium Alginate 4. Secondary Dressing Dry Gauze Roll Gauze 6. Support Layer Applied Other support layer (specify in notes) Notes Development worker, international aid) Signed: 12/04/2019 3:22:35 PM By: Mikeal Hawthorne EMT/HBOT Signed: 12/04/2019 6:18:05 PM By: Carlene Coria RN Previous Signature: 12/03/2019 6:08:38 PM Version By: Carlene Coria RN Entered By: Mikeal Hawthorne on 12/04/2019 13:47:29 -------------------------------------------------------------------------------- Vitals Details Patient Name: Date of Service: Macnair, Skylan E. 12/03/2019 7:30 AM Medical Record [id number] Patient Account Number: [id number] Date of Birth/Sex: Treating RN: February 27, 1986 (34 y.o. [name] Primary Care Arieonna Medine: [place], [place] Other Clinician: Referring Zell Hylton: Treating Kelena Garrow/Extender:Robson, [name], [name]: 204 Vital Signs Time Taken: 07:52 Temperature (F): 98 Height (in): 70 Pulse (bpm): 96 Weight (lbs): 216 Respiratory Rate (breaths/min): 18 Body Mass Index (BMI): 31 Blood Pressure (mmHg): 110/64 Reference Range: 80 - 120 mg / dl Electronic Signature(s) Signed: 12/03/2019 6:08:38 PM By: Carlene Coria RN Entered By: Carlene Coria on 12/03/2019 07:53:24

## 2019-12-04 NOTE — Progress Notes (Signed)
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 infections in treated him aggressively for this although we have not made any improvement here. 08/11/17; left lateral leg. Surface continues to improve with the Iodoflex although we are not seeing much improvement in overall wound dimensions. Areas on his plantar left foot and right foot show no improvement. In fact the right foot looks somewhat worse 08/18/17; left lateral leg. We changed to [facility] Blue last week after a prolonged course of Iodoflex which helps get the surface better. It appears that the wound with is improved. Continue with difficult areas on the left dorsal first second and plantar first second on the right 09/01/17; patient arrives in clinic today having had  a temperature of 103 yesterday. He was seen in the ER and [facility]. The patient was concerned he could have cellulitis again in the right leg however they diagnosed him with a UTI and he is now on Keflex. He has a history of cellulitis which is been recurrent and difficult but this is been in the left leg, in the past 5 use doxycycline. He does in and out catheterizations at home which are risk factors for UTI 09/08/17; patient will be completing his Keflex this weekend. The erythema on the left leg is considerably better. He has  a new wound today on the medial part of the right leg small superficial almost looks like a skin tear. He has worsening of the area on the right dorsal first and second toe. His major area on the left lateral leg is better. Using Hydrofera Blue on all areas 09/15/17; gradual reduction in width on the long wound in the left lateral leg. No debridement required. He also has wounds on the plantar aspect of his left first second toe web space and on the dorsal aspect of the right first second toe web space. 09/22/17; there continues to be very gradual improvements in the dimensions of the left lateral leg wound. He hasn't round erythematous spot with might be pressure on his wheelchair. There is no evidence obviously of infection no purulence no warmth He has a dry scaled area on the plantar aspect of the left first second toe Improved area on the dorsal right first second toe. 09/29/17; left lateral leg wound continues to improve in dimensions mostly with an is still a fairly long but increasingly narrow wound. He has a dry scaled area on the plantar aspect of his left first second toe web space Increasingly concerning area on the dorsal right first second toe. In fact I am concerned today about possible cellulitis around this wound. The areas extending up his second toe and although there is deformities here almost appears to abut on the nailbed. 10/06/17; left lateral leg wound continues to make very gradual progress. Tissue culture I did from the right first second toe dorsal foot last time grew MRSA and enterococcus which was vancomycin sensitive. This was not sensitive to clindamycin or doxycycline. He is allergic to Zyvox and sulfa we have therefore arrange for him to have dalvance infusion tomorrow. He is had this in the past and tolerated it well 10/20/17; left lateral leg wound continues to make decent progress. This is certainly reduced in terms of with there is advancing  epithelialization.The cellulitis in the right foot looks better although he still has a deep wound in the dorsal aspect of the first second toe web space. Plantar left first toe web space on the left I think is making some progress 10/27/17; left lateral leg wound continues to make decent progress. Advancing epithelialization.using Hydrofera Blue The right first second toe web space wound is better-looking using silver alginate Improvement in the left plantar first second toe web space. Again using silver alginate 11/03/17 left lateral leg wound continues to make decent progress albeit slowly. Using [facility] The right per second toe web space continues to be a very problematic looking punched out wound. I obtained a piece of tissue for deep culture I did extensively treated this for fungus. It is difficult to imagine that this is a pressure area as the patient states other than going outside he doesn't really wear shoes at home The left plantar first second toe web space looked  fairly senescent. Necrotic edges. This required debridement change to [facility] Blue to all wound areas 11/10/17; left lateral leg wound continues to contract. Using Hydrofera Blue On the right dorsal first second toe web space dorsally. Culture I did of this area last week grew MRSA there is not an easy oral option in this patient was multiple antibiotic allergies or intolerances. This was only a rare culture isolate I'm therefore going to use Bactroban under silver alginate On the left plantar first second toe web space. Debridement is required here. This is also unchanged 11/17/17; left lateral leg wound continues to contract using Hydrofera Blue this is no longer the major issue. The major concern here is the right first second toe web space. He now has an open area going from dorsally to the plantar aspect. There is now wound on the inner lateral part of the first toe. Not a very viable surface on this. There is  erythema spreading medially into the forefoot. No major change in the left first second toe plantar wound 11/24/17; left lateral leg wound continues to contract using Hydrofera Blue. Nice improvement today The right first second toe web space all of this looks a lot less angry than last week. I have given him clindamycin and topical Bactroban for MRSA and terbinafine for the possibility of underlining tinea pedis that I could not control with ketoconazole. Looks somewhat better The area on the plantar left first second toe web space is weeping with dried debris around the wound 12/01/17; left lateral leg wound continues to contract he Hydrofera Blue. It is becoming thinner in terms of with nevertheless it is making good improvement. The right first second toe web space looks less angry but still a large necrotic-looking wounds starting on the plantar aspect of the right foot extending between the toes and now extensively on the base of the right second toe. I gave him clindamycin and topical Bactroban for MRSA anterior benefiting for the possibility of underlying tinea pedis. Not looking better today The area on the left first/second toe looks better. Debrided of necrotic debris 12/05/17* the patient was worked in urgently today because over the weekend he found blood on his incontinence bad when he woke up. He was found to have an ulcer by his wife who does most of his wound care. He came in today for [place] to look at this. He has not had a history of wounds in his buttocks in spite of his paraplegia. 12/08/17; seen in follow-up today at his usual appointment. He was seen earlier this week and found to have a new wound on his buttock. We also follow him for wounds on the left lateral leg, left first second toe web space and right first second toe web space 12/15/17; we have been using Hydrofera Blue to the left lateral leg which has improved. The right first second toe web space has also improved. Left  first second toe web space plantar aspect looks stable. The left buttock has worsened using Santyl. Apparently the buttock has drainage 12/22/17; we have been using Hydrofera Blue to the left lateral leg which continues to improve now 2 small wounds separated by normal skin. He tells [place] he had a fever up to 100 yesterday he is prone to UTIs but has not noted anything different. He does in and out catheterizations. The area between the first and second toes today does not look good necrotic surface covered with what looks to be purulent drainage and erythema extending into  the third toe. I had gotten this to something that I thought look better last time however it is not look good today. He also has a necrotic surface over the buttock wound which is expanded. I thought there might be infection under here so I removed a lot of the surface with a #5 curet though nothing look like it really needed culturing. He is been using Santyl to this area 12/27/17; his original wound on the left lateral leg continues to improve using Hydrofera Blue. I gave him samples of Baxdella although he was unable to take them out of fear for an allergic reaction ["lump in his throat"].the culture I did of the purulent drainage from his second toe last week showed both enterococcus and a set Enterobacter I was also concerned about the erythema on the bottom of his foot although paradoxically although this looks somewhat better today. 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 We've been using silver alginate here. They came up today that he sleeps in a recliner, I'm not sure why but I asked him to stop this 01/03/18; his original wound we've been using Hydrofera Blue is now separated into 2 areas. Ulcer on his left buttock is better he is off the recliner and sleeping in bed Finally both wound areas between his first and second toes  also looks some better 01/10/18; his original wound on the left lateral leg is now separated into 2 wounds we've been using Hydrofera Blue Ulcer on his left buttock has some drainage. There is a small probing site going into muscle layer superiorly.using silver alginate -He arrives today with a deep tissue injury on the left heel The wound on the dorsal aspect of his first second toe on the left looks a lot betterusing silver alginate ketoconazole The area on the first second toe web space on the right also looks a lot bette 01/17/18; his original wound on the left lateral leg continues to progress using Hydrofera Blue Ulcer on his left buttock also is smaller surface healthier except for a small probing site going into the muscle layer superiorly. 2.4 cm of tunneling in this area DTI on his left heel we have only been offloading. Looks better than last week no threatened open no evidence of infection the wound on the dorsal aspect of the first second toe on the left continues to look like it's regressing we have only been using silver alginate and terbinafine orally The area in the first second toe web space on the right also looks to be a lot better using silver alginate and terbinafine I think this was prompted by tinea pedis 01/31/18; the patient was hospitalized in [place] last week apparently for a complicated UTI. He was discharged on cefepime he does in and out catheterizations. In the hospital he was discovered M I don't mild elevation of AST and ALTs and the terbinafine was stopped.predictably the pressure ulcer on his buttock looks betterusing silver alginate. The area on the left lateral leg also is better using Hydrofera Blue. The area between the first and second toes on the left better. First and second toes on the right still substantial but better. Finally the DTI on the left heel has held together and looks like it's resolving 02/07/18-he is here in follow-up evaluation for  multiple ulcerations. He has new injury to the lateral aspect of the last issue a pressure ulcer, he states this  is from adhesive removal trauma. He states he has tried multiple adhesive products with no success. All other ulcers appear stable. The left heel DTI is resolving. We will continue with same treatment plan and follow-up next week. 02/14/18; follow-up for multiple areas. He has a new area last week on the lateral aspect of his pressure ulcer more over the posterior trochanter. The original pressure ulcer looks quite stable has healthy granulation. 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 Almost fully epithelialized on the original superior area using Hydrofera Blue DTI on the left heel has peeled off this week to reveal a small superficial wound under denuded skin and subcutaneous tissue Both areas between the first and second toes look better including nothing open on the left 02/21/18; The patient's wounds on his left ischial tuberosity and posterior left greater trochanter actually looked better. He has a large area of irritation around the area which I think is contact dermatitis. I am doubtful that this is fungal His original wound on the left lateral calf continues to improve we have been using Hydrofera Blue There is no open area in the left first second toe web space although there is a lot of thick callus The DTI on the left heel required debridement today of necrotic surface eschar and subcutaneous tissue using silver alginate Finally the area on the right first second toe webspace continues to contract using silver alginate and ketoconazole 02/28/18 Left ischial tuberosity wounds look better using silver alginate. Original wound on the left calf only has one small open area left using Hydrofera Blue DTI on the left heel required debridement mostly removing skin from around  this wound surface. Using silver alginate The areas on the right first/second toe web space using silver alginate and ketoconazole 03/08/18 on evaluation today patient appears to be doing decently well as best I can tell in regard to his wounds. This is the first time that I have seen him as he generally is followed by Dr. Dellia Nims. With that being said none of his wounds appear to be infected he does have an area where there is some skin covering what appears to be a new wound on the left dorsal surface of his great toe. This is right at the nail bed. With that being said I do believe that debrided away some of the excess skin can be of benefit in this regard. Otherwise he has been tolerating the dressing changes without complication. 03/14/18; patient arrives today with the multiplicity of wounds that we are following. He has not been systemically unwell Original wound on the left lateral calf now only has 2 small open areas we've been using Hydrofera Blue which should continue The deep tissue injury on the left heel requires debridement today. We've been using silver alginate The left first second toe and the right first second toe are both are reminiscence what I think was tinea pedis. Apparently some of the callus Surface between the toes was removed last week when it started draining. Purulent drainage coming from the wound on the ischial tuberosity on the left. 03/21/18-He is here in follow-up evaluation for multiple wounds. 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 He tolerated debridement. The only change to last week's recommendations is to discontinue antifungal cream between toes. He will follow-up next week 03/28/18; following up for multiple wounds;Concern this week is  streaking redness and swelling in the right foot. He is going to need antibiotics for this. 03/31/18; follow-up for right foot cellulitis.  Streaking redness and swelling in the right foot on 03/28/18. 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 I put him on clindamycin 300 mg every 6 and brought him in for a quick check. He has an open wound between his first and second toes on the right foot as a potential source. 04/04/18; Right foot cellulitis is resolving he is completing clindamycin. This is truly good news Left lateral calf wound which is initial wound only has one small open area inferiorly this is close to healing out. He has compression stockings. We will use Hydrofera Blue right down to the epithelialization of this Nonviable surface on the left heel which was initially pressure with a DTI. We've been using Hydrofera Blue. I'm going to switch this back to silver alginate Left first second toe/tinea pedis this looks better using silver alginate Right first second toe tinea pedis using silver alginate Large pressure ulcers on theLeft ischial tuberosity. Small wound here Looks better. I am uncertain about the surface over the large wound. Using silver alginate 04/11/18; Cellulitis in the right foot is resolved Left lateral calf wound which was his original wounds still has 2 tiny open areas remaining this is just about closed Nonviable surface on the left heel is better but still requires debridement Left first second toe/tinea pedis still open using silver alginate Right first second toe wound tinea pedis I asked him to go back to using ketoconazole and silver alginate Large pressure ulcers on the left ischial tuberosity this shear injury here is resolved. Wound is smaller. No evidence of infection using silver alginate 04/18/18; Patient arrives with an intense area of cellulitis in the right mid lower calf extending into the right heel area. Bright red and warm. Smaller area on the left anterior leg. He has a significant history of MRSA. He will definitely need antibioticsdoxycycline He now has 2 open  areas on the left ischial tuberosity the original large wound and now a satellite area which I think was above his initial satellite areas. Not a wonderful surface on this satellite area surrounding erythema which looks like pressure related. His left lateral calf wound again his original wound is just about closed Left heel pressure injury still requiring debridement Left first second toe looks a lot better using silver alginate Right first second toe also using silver alginate and ketoconazole cream also looks better 04/20/18; the patient was worked in early today out of concerns with his cellulitis on the right leg. I had started him on doxycycline. This was 2 days ago. His wife was concerned about the swelling in the area. Also concerned about the left buttock. He has not been systemically unwell no fever chills. No nausea vomiting or diarrhea 04/25/18; the patient's left buttock wound is continued to deteriorate he is using Hydrofera Blue. He is still completing clindamycin for the cellulitis on the right leg although all of this looks better. 05/02/18 Left buttock wound still with a lot of drainage and a very tightly adherent fibrinous necrotic surface. He has a deeper area superiorly The left lateral calf wound is still closed DTI wound on the left heel necrotic surface especially the circumference using Iodoflex Areas between his left first second toe and right first second toe both look better. Dorsally and the right first second toe he had a necrotic surface although at smaller. In using  silver alginate and ketoconazole. I did a culture last week which was a deep tissue culture of the reminiscence of the open wound on the right first second toe dorsally. This grew a few Acinetobacter and a few methicillin-resistant staph aureus. Nevertheless the area actually this week looked better. I didn't feel the need to specifically address this at least in terms of systemic antibiotics. 05/09/18;  wounds are measuring larger more drainage per our intake. We are using Santyl covered with alginate on the large superficial buttock wounds, Iodosorb on the left heel, ketoconazole and silver alginate to the dorsal first and second toes bilaterally. 05/16/18; The area on his left buttock better in some aspects although the area superiorly over the ischial tuberosity required an extensive debridement.using Santyl Left heel appears stable. Using Iodoflex The areas between his first and second toes are not bad however there is spreading erythema up the dorsal aspect of his left foot this looks like cellulitis again. He is insensate the erythema is really very brilliant.o Erysipelas He went to see an allergist days ago because he was itching part of this he had lab work done. This showed a white count of 15.1 with 70% neutrophils. Hemoglobin of 11.4 and a platelet count of 659,000. Last white count we had in Epic was a 2-1/2 years ago which was 25.9 but he was ill at the time. He was able to show me some lab work that was done by his primary physician the pattern is about the same. 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 But prompted me to go ahead and do x-rays of both feet and the pelvis rule out osteomyelitis. He also had a comprehensive metabolic panel this was reasonably normal his albumin was 3.7 liver function tests BUN/creatinine all normal 05/23/18; x-rays of both his feet from last week were negative for underlying pulmonary abnormality. The x-ray of his pelvis however showed mild irregularity in the left ischial which may represent some early osteomyelitis. The wound in the left ischial continues to get deeper clearly now exposed muscle. Each week necrotic surface material over this area. Whereas the rest of the wounds do not look so bad. The left ischial wound we have been using Santyl and calcium alginate To the left heel surface necrotic  debris using Iodoflex The left lateral leg is still healed Areas on the left dorsal foot and the right dorsal foot are about the same. There is some inflammation on the left which might represent contact dermatitis, fungal dermatitis I am doubtful cellulitis although this looks better than last week 05/30/18; CT scan done at Hospital did not show any osteomyelitis or abscess. Suggested the possibility of underlying cellulitis although I don't see a lot of evidence of this at the bedside The wound itself on the left buttock/upper thigh actually looks somewhat better. No debridement Left heel also looks better no debridement continue Iodoflex Both dorsal first second toe spaces appear better using Lotrisone. Left still required debridement 06/06/18; Intake reported some purulent looking drainage from the left gluteal wound. Using Santyl and calcium alginate Left heel looks better although still a nonviable surface requiring debridement The left dorsal foot first/second webspace actually expanding and somewhat deeper. I may consider doing a shave biopsy of this area Right dorsal foot first/second webspace appears stable to improved. Using Lotrisone and silver alginate to both these areas 06/13/18 Left gluteal surface looks better. Now separated in the 2 wounds. No debridement required.  Still drainage. We'll continue silver alginate Left heel continues to look better with Iodoflex continue this for at least another week Of his dorsal foot wounds the area on the left still has some depth although it looks better than last week. We've been using Lotrisone and silver alginate 06/20/18 Left gluteal continues to look better healthy tissue Left heel continues to look better healthy granulation wound is smaller. He is using Iodoflex and his long as this continues continue the Iodoflex Dorsal right foot looks better unfortunately dorsal left foot does not. There is swelling and erythema of his  forefoot. He had minor trauma to this several days ago but doesn't think this was enough to have caused any tissue injury. Foot looks like cellulitis, we have had this problem before 06/27/18 on evaluation today patient appears to be doing a little worse in regard to his foot ulcer. 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 Both of which would really be his only options for treating this infection. In the past he has been given and effusion of Orbactiv. This is done very well for him in the past again it's one time dosing IV antibiotic therapy. Subsequently I do believe this is something we're gonna need to see about doing at this point in time. Currently his other wounds seem to be doing somewhat better in my pinion I'm pretty happy in that regard. 07/03/18 on evaluation today patient's wounds actually appear to be doing fairly well. He has been tolerating the dressing changes without complication. All in all he seems to be showing signs of improvement. In regard to the antibiotics he has been dealing with infectious disease since I saw him last week as far as getting this scheduled. In the end he's going to be going to the [facility] help [facility] to have this done this coming Friday. In the meantime he has been continuing to perform the dressing changes in such as previous. There does not appear to be any evidence of infection worsengin at this time. 07/10/18; Since I last saw this man 2 weeks ago things have actually improved. IV antibiotics of resulted in less forefoot erythema although there is still some present. He is not systemically unwell Left buttock wounds 2 now have no depth there is increased epithelialization Using silver alginate Left heel still requires debridement using Iodoflex Left dorsal foot still with a sizable wound about the size  of a border but healthy granulation Right dorsal foot still with a slitlike area using silver alginate 07/18/18; the patient's cellulitis in the left foot is improved in fact I think it is on its way to resolving. Left buttock wounds 2 both look better although the larger one has hypertension granulation we've been using silver alginate Left heel has some thick circumferential redundant skin over the wound edge which will need to be removed today we've been using Iodoflex Left dorsal foot is still a sizable wound required debridement using silver alginate The right dorsal foot is just about closed only a small open area remains here 07/25/18; left foot cellulitis is resolved Left buttock wounds 2 both look better. Hyper-granulation on the major area Left heel as some debris over the surface but otherwise looks a healthier wound. Using silver collagen Right dorsal foot is just about closed 07/31/18; arrives with our intake nurse worried about purulent drainage from the buttock.  We had hyper-granulation here last week His buttock wounds 2 continue to look better Left heel some debris over the surface but measuring smaller. Right dorsal foot unfortunately has openings between the toes Left foot superficial wound looks less aggravated. 08/07/18 Buttock wounds continue to look better although some of her granulation and the larger medial wound. silver alginate Left heel continues to look a lot better.silver collagen Left foot superficial wound looks less stable. Requires debridement. He has a new wound superficial area on the foot on the lateral dorsal foot. Right foot looks better using silver alginate without Lotrisone 08/14/2018; patient was in the ER last week diagnosed with a UTI. He is now on Cefpodoxime and Macrodantin. Buttock wounds continued to be smaller. Using silver alginate Left heel continues to look better using silver collagen Left foot superficial wound looks as though it is  improving Right dorsal foot area is just about healed. 08/21/2018; patient is completed his antibiotics for his UTI. He has 2 open areas on the buttocks. There is still not closed although the surface looks satisfactory. Using silver alginate Left heel continues to improve using silver collagen The bilateral dorsal foot areas which are at the base of his first and second toes/possible tinea pedis are actually stable on the left but worse on the right. The area on the left required debridement of necrotic surface. After debridement I obtained a specimen for PCR culture. The right dorsal foot which is been just about healed last week is now reopened 08/28/2018; culture done on the left dorsal foot showed coag negative staph both staph epidermidis and Lugdunensis. I think this is worthwhile initiating systemic treatment. I will use doxycycline given his long list of allergies. The area on the left heel slightly improved but still requiring debridement. The large wound on the buttock is just about closed whereas the smaller one is larger. Using silver alginate in this area 09/04/2018; patient is completing his doxycycline for the left foot although this continues to be a very difficult wound area with very adherent necrotic debris. We are using silver alginate to all his wounds right foot left foot and the small wounds on his buttock, silver collagen on the left heel. 09/11/2018; once again this patient has intense erythema and swelling of the left forefoot. Lesser degrees of erythema in the right foot. He has a long list of allergies and intolerances. I will reinstitute doxycycline. 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 Using silver alginate Left heel also looks better using silver collagen Unfortunately both the areas on his feet look worse. The area on the left first second webspace is now gone through to the plantar part of his foot. The area  on the left foot anteriorly is irritated with erythema and swelling in the forefoot. 09/25/2018 His wound on the left plantar heel looks better. Using silver collagen The area on the left buttock 2 small remnant areas. One is closed one is still open. Using silver alginate The areas between both his first and second toes look worse. This in spite of long-standing antifungal therapy with ketoconazole and silver alginate which should have antifungal activity He has small areas around his original wound on the left calf one is on the bottom of the original scar tissue and one superiorly both of these are small and superficial but again given wound history in this site this is worrisome 10/02/2018 Left plantar heel continues to gradually contract using  silver collagen Left buttock wound is unchanged using silver alginate The areas on his dorsal feet between his first and second toes bilaterally look about the same. I prescribed clindamycin ointment to see if we can address chronic staph colonization and also the underlying possibility of erythrasma The left lateral lower extremity wound is actually on the lateral part of his ankle. Small open area here. We have been using silver alginate 10/09/2018; Left plantar heel continues to look healthy and contract. No debridement is required Left buttock slightly smaller with a tape injury wound just below which was new this week Dorsal feet somewhat improved I have been using clindamycin Left lateral looks lower extremity the actual open area looks worse although a lot of this is epithelialized. I am going to change to silver collagen today He has a lot more swelling in the right leg although this is not pitting not red and not particularly warm there is a lot of spasm in the right leg usually indicative of people with paralysis of some underlying discomfort. We have reviewed his vascular status from 2017 he had a left greater saphenous vein ablation. I  wonder about referring him back to vascular surgery if the area on the left leg continues to deteriorate. 10/16/2018 in today for follow-up and management of multiple lower extremity ulcers. His left Buttock wound is much lower smaller and almost closed completely. The wound to the left ankle has began to reopen with Epithelialization and some adherent slough. He has multiple new areas to the left foot and leg. The left dorsal foot without much improvement. Wound present between left great webspace and 2nd toe. Erythema and edema present right leg. Right LE ultrasound obtained on 10/10/18 was negative for DVT. 10/23/2018; Left buttock is closed over. Still dry macerated skin but there is no open wound. I suspect this is chronic pressure/moisture Left lateral calf is quite a bit worse than when I saw this last. There is clearly drainage here he has macerated skin into the left plantar heel. We will change the primary dressing to alginate Left dorsal foot has some improvement in overall wound area. Still using clindamycin and silver alginate Right dorsal foot about the same as the left using clindamycin and silver alginate The erythema in the right leg has resolved. He is DVT rule out was negative Left heel pressure area required debridement although the wound is smaller and the surface is health 10/26/2018 The patient came back in for his nurse check today predominantly because of the drainage coming out of the left lateral leg with a recent reopening of his original wound on the left lateral calf. He comes in today with a large amount of surrounding erythema around the wound extending from the calf into the ankle and even in the area on the dorsal foot. He is not systemically unwell. He is not febrile. Nevertheless this looks like cellulitis. We have been using silver alginate to the area. I changed him to a regular visit and I am going to prescribe him doxycycline. The rationale here is a long  list of medication intolerances and a history of MRSA. I did not see anything that I thought would provide a valuable culture 10/30/2018 Follow-up from his appointment 4 days ago with really an extensive area of cellulitis in the left calf left lateral ankle and left dorsal foot. I put him on doxycycline. He has a long list of medication allergies which are true allergy reactions. 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 In any case he is a lot better today. The erythema especially in the anterior and lateral left calf is better. He still has left ankle erythema. He also is complaining about increasing edema in the right leg we have only been using Kerlix Coban and he has been doing the wraps at home. Finally he has a spotty rash on the medial part of his upper left calf which looks like folliculitis or perhaps wrap occlusion type injury. Small superficial macules not pustules 11/06/18 patient arrives today with again a considerable degree of erythema around the wound on the left lateral calf extending into the dorsal ankle and dorsal foot. This is a lot worse than when I saw this last week. He is on doxycycline really with not a lot of improvement. He has not been systemically unwell Wounds on the; left heel actually looks improved. Original area on the left foot and proximity to the first and second toes looks about the same. He has superficial areas on the dorsal foot, anterior calf and then the reopening of his original wound on the left lateral calf which looks about the same The only area he has on the right is the dorsal webspace first and second which is smaller. He has a large area of dry erythematous skin on the left buttock small open area here. 11/13/2018; the patient arrives in much better condition. The erythema around the wound on the left lateral calf is a lot better. Not sure whether this was the clindamycin or  the TCA and ketoconazole or just in the improvement in edema control [stasis dermatitis]. In any case this is a lot better. The area on the left heel is very small and just about resolved using silver collagen we have been using silver alginate to the areas on his dorsal feet 11/20/2018; his wounds include the left lateral calf, left heel, dorsal aspects of both feet just proximal to the first second webspace. He is stable to slightly improved. I did not think any changes to his dressings were going to be necessary 11/27/2018 he has a reopening on the left buttock which is surrounded by what looks like tinea or perhaps some other form of dermatitis. The area on the left dorsal foot has some erythema around it I have marked this area but I am not sure whether this is cellulitis or not. Left heel is not closed. Left calf the reopening is really slightly longer and probably worse 1/13; in general things look better and smaller except for the left dorsal foot. Area on the left heel is just about closed, left buttock looks better only a small wound remains in the skin looks better [using Lotrisone] 1/20; the area on the left heel only has a few remaining open areas here. Left lateral calf about the same in terms of size, left dorsal foot slightly larger right lateral foot still not closed. The area on the left buttock has no open wound and the surrounding skin looks a lot better 1/27; the area on the left heel is closed. Left lateral calf better but still requiring extensive debridements. The area on his left buttock is closed. He still has the open areas on the left dorsal foot which is slightly smaller in the right foot which is slightly expanded. We have been using Iodoflex on these areas as well 2/3; left heel is closed. Left lateral calf still requiring debridement using Iodoflex there is  no open area on his left buttock however he has dry scaly skin over a large area of this. Not really responding  well to the Lotrisone. Finally the areas on his dorsal feet at the level of the first second webspace are slightly smaller on the right and about the same on the left. Both of these vigorously debrided with Anasept and gauze 2/10; left heel remains closed he has dry erythematous skin over the left buttock but there is no open wound here. Left lateral leg has come in and with. Still requiring debridement we have been using Iodoflex here. Finally the area on the left dorsal foot and right dorsal foot are really about the same extremely dry callused fissured areas. He does not yet have a dermatology appointment 2/17; left heel remains closed. He has a new open area on the left buttock. The area on the left lateral calf is bigger longer and still covered in necrotic debris. No major change in his foot areas bilaterally. I am awaiting for a dermatologist to look on this. We have been using ketoconazole I do not know that this is been doing any good at all. 2/24; left heel remains closed. The left buttock wound that was new reopening last week looks better. The left lateral calf appears better also although still requires debridement. The major area on his foot is the left first second also requiring debridement. We have been putting Prisma on all wounds. I do not believe that the ketoconazole has done too much good for his feet. He will use Lotrisone I am going to give him a 2-week course of terbinafine. We still do not have a dermatology appointment 3/2 left heel remains closed however there is skin over bone in this area I pointed this out to him today. The left buttock wound is epithelialized but still does not look completely stable. The area on the left leg required debridement were using silver collagen here. With regards to his feet we changed to Lotrisone last week and silver alginate. 3/9; left heel remains closed. Left buttock remains closed. The area on the right foot is essentially closed.  The left foot remains unchanged. Slightly smaller on the left lateral calf. Using silver collagen to both of these areas 3/16-Left heel remains closed. Area on right foot is closed. Left lateral calf above the lateral malleolus open wound requiring debridement with easy bleeding. Left dorsal wound proximal to first toe also debrided. Left ischial area open new. Patient has been using Prisma with wrapping every 3 days. Dermatology appointment is apparently tomorrow.Patient has completed his terbinafine 2-week course with some apparent improvement according to him, there is still flaking and dry skin in his foot on the left 3/23; area on the right foot is reopened. The area on the left anterior foot is about the same still a very necrotic adherent surface. He still has the area on the left leg and reopening is on the left buttock. He apparently saw dermatology although I do not have a note. According to the patient who is usually fairly well informed they did not have any good ideas. Put him on oral terbinafine which she is been on before. 3/30; using silver collagen to all wounds. Apparently his dermatologist put him on doxycycline and rifampin presumably some culture grew staph. I do not have this result. He remains on terbinafine although I have used terbinafine on him before 4/6; patient has had a fairly substantial reopening on the right foot between the first  and second toes. He is finished his terbinafine and I believe is on doxycycline and rifampin still as prescribed by dermatology. We have been using silver collagen to all his wounds although the patient reports that he thinks silver alginate does better on the wounds on his buttock. 4/13; the area on his left lateral calf about the same size but it did not require debridement. Left dorsal foot just proximal to the webspace between the first and second toes is about the same. Still nonviable surface. I note some superficial bronze  discoloration of the dorsal part of his foot Right dorsal foot just proximal to the first and second toes also looks about the same. I still think there may be the same discoloration I noted above on the left Left buttock wound looks about the same 4/20; left lateral calf appears to be gradually contracting using silver collagen. 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 The left dorsal foot wound is debrided of tightly adherent necrotic debris and really cleans up quite nicely. The right area is worse with expansion. I did not debride this it is now over the base of the second toe The area on his left buttock is smaller no debridement is required using silver collagen 5/4; left calf continues to make good progress. He arrives with erythema around the wounds on his dorsal foot which even extends to the plantar aspect. Very concerning for coexistent infection. He is finished the erythromycin I gave him for possible erythrasma this does not seem to have helped. The area on the left foot is about the same base of the dorsal toes Is area on the buttock looks improved on the left 5/11; left calf and left buttock continued to make good progress. Left foot is about the same to slightly improved. Major problem is on the right foot. He has not had an x-ray. Deep tissue culture I did last week showed both Enterobacter and E. coli. I did not change the doxycycline I put him on empirically although neither 1 of these were plated to doxycycline. He arrives today with the erythema looking worse on both the dorsal and plantar foot. Macerated skin on the bottom of the foot. he has not been systemically unwell 5/18-Patient returns at 1 week, left calf wound appears to be making some progress, left buttock wound appears slightly worse than last time, left foot wound looks slightly better, right foot redness is marginally better. X-ray of both feet show no  air or evidence of osteomyelitis. Patient is finished his Omnicef and terbinafine. He continues to have macerated skin on the bottom of the left foot as well as right 5/26; left calf wound is better, left buttock wound appears to have multiple small superficial open areas with surrounding macerated skin. X-rays that I did last time showed no evidence of osteomyelitis in either foot. He is finished cefdinir and doxycycline. I do not think that he was on terbinafine. He continues to have a large superficial open area on the right foot anterior dorsal and slightly between the first and second toes. I did send him to dermatology 2 months ago or so wondering about whether they would do a fungal scraping. I do not believe they did but did do a culture. We have been using silver alginate to the toe areas, he has been using antifungals at home topically either ketoconazole or Lotrisone. We are using silver collagen on the left foot, silver alginate on the right, silver  collagen on the left lateral leg and silver alginate on the left buttock 6/1; left buttock area is healed. We have the left dorsal foot, left lateral leg and right dorsal foot. We are using silver alginate to the areas on both feet and silver collagen to the area on his left lateral calf 6/8; the left buttock apparently reopened late last week. He is not really sure how this happened. He is tolerating the terbinafine. Using silver alginate to all wounds 6/15; left buttock wound is larger than last week but still superficial. Came in the clinic today with a report of purulence from the left lateral leg I did not identify any infection Both areas on his dorsal feet appear to be better. He is tolerating the terbinafine. Using silver alginate to all wounds 6/22; left buttock is about the same this week, left calf quite a bit better. His left foot is about the same however he comes in with erythema and warmth in the right forefoot once again.  Culture that I gave him in the beginning of May showed Enterobacter and E. coli. I gave him doxycycline and things seem to improve although neither 1 of these organisms was specifically plated. 6/29; left buttock is larger and dry this week. Left lateral calf looks to me to be improved. Left dorsal foot also somewhat improved right foot completely unchanged. The erythema on the right foot is still present. He is completing the Ceftin dinner that I gave him empirically [see discussion above.) 7/6 - All wounds look to be stable and perhaps improved, the left buttock wound is slightly smaller, per patient bleeds easily, completed ceftin, the right foot redness is less, he is on terbinafine 7/13; left buttock wound about the same perhaps slightly narrower. Area on the left lateral leg continues to narrow. Left dorsal foot slightly smaller right foot about the same. We are using silver alginate on the right foot and Hydrofera Blue to the areas on the left. Unna boot on the left 2 layer compression on the right 7/20; left buttock wound absolutely the same. Area on lateral leg continues to get better. Left dorsal foot require debridement as did the right no major change in the 7/27; left buttock wound the same size necrotic debris over the surface. The area on the lateral leg is closed once again. His left foot looks better right foot about the same although there is some involvement now of the posterior first second toe area. He is still on terbinafine which I have given him for a month, not certain a centimeter major change 06/25/19-All wounds appear to be slightly improved according to report, left buttock wound looks clean, both foot wounds have minimal to no debris the right dorsal foot has minimal slough. We are using Hydrofera Blue to the left and silver alginate to the right foot and ischial wound. 8/10-Wounds all appear to be around the same, the right forefoot distal part has some redness which  was not there before, however the wound looks clean and small. Ischial wound looks about the same with no changes 8/17; his wound on the left lateral calf which was his original chronic venous insufficiency wound remains closed. Since I last saw him the areas on the left dorsal foot right dorsal foot generally appear better but require debridement. The area on his left initial tuberosity appears somewhat larger to me perhaps hyper granulated and bleeds very easily. We have been using Hydrofera Blue to the left dorsal foot and silver alginate  to everything else 8/24; left lateral calf remains closed. The areas on his dorsal feet on the webspace of the first and second toes bilaterally both look better. The area on the left buttock which is the pressure ulcer stage II slightly smaller. I change the dressing to Hydrofera Blue to all areas 8/31; left lateral calf remains closed. The area on his dorsal feet bilaterally look better. Using Hydrofera Blue. Still requiring debridement on the left foot. No change in the left buttock pressure ulcers however 9/14; left lateral calf remains closed. Dorsal feet look quite a bit better than 2 weeks ago. Flaking dry skin also a lot better with the ammonium lactate I gave him 2 weeks ago. The area on the left buttock is improved. He states that his Roho cushion developed a leak and he is getting a new one, in the interim he is offloading this vigorously 9/21; left calf remains closed. Left heel which was a possible DTI looks better this week. He had macerated tissue around the left dorsal foot right foot looks satisfactory and improved left buttock wound. I changed his dressings to his feet to silver alginate bilaterally. Continuing Hydrofera Blue on the left buttock. 9/28 left calf remains closed. Left heel did not develop anything [possible DTI] dry flaking skin on the left dorsal foot. Right foot looks satisfactory. Improved left buttock wound. We are using  silver alginate on his feet Hydrofera Blue on the buttock. I have asked him to go back to the Lotrisone on his feet including the wounds and surrounding areas 10/5; left calf remains closed. The areas on the left and right feet about the same. A lot of this is epithelialized however debris over the remaining open areas. He is using Lotrisone and silver alginate. The area on the left buttock using Hydrofera Blue 10/26. Patient has been out for 3 weeks secondary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 really help. I'm not sure why I only used 2 weeks of Iodoflex on this there is no evidence of surrounding infection. He has a small area on the right second toe which looks as though it's progressing towards closure 10/28/16; the wounds on his toes appear to be closed. No major change in the left lateral leg wound although the surface looks somewhat better using Iodoflex. He has had previous arterial studies that were normal. He has had reflux studies and is status post ablation although I don't have any exact notes on which vein  was ablated. I'll need to check the surgical record 11/04/16; he's had a reopening between the first and second toe on the left and right. No major change in the left lateral leg wound. There is what appears to be cellulitis of the left dorsal foot 11/18/16 the patient was hospitalized initially in [place] and then subsequently transferred to [facility] and was admitted there from 11/09/16 through 11/12/16. He had developed progressive cellulitis on the right leg in spite of the doxycycline I gave him. I'd spoken to the hospitalist in [place] who was concerned about continuing leukocytosis. CT scan is what I suggested this was done which showed soft tissue swelling without evidence of osteomyelitis or an underlying abscess blood cultures were negative. At [facility] he was treated with vancomycin and Primaxin and then add an infectious disease consult. He was transitioned to Ceftaroline. He has been making progressive improvement. Overall a severe cellulitis of the right leg. He is been using silver alginate to her original wound on the left leg. The wounds in his toes on the right are closed there is a small open area on the base of the left second toe 11/26/15; the patient's right leg is much better although there is still some edema here this could be reminiscent from his severe cellulitis likely on top of some degree of lymphedema. His left anterior leg wound has less surface slough as reported by her intake nurse. Small wound at the base of the left second toe 12/02/16; patient's right leg is better and there is no open wound here. His left anterior lateral leg wound continues to have a healthy-looking surface. Small wound at the base of the left second toe however there is erythema in the left forefoot which is worrisome 12/16/16; is no open wounds on his right leg. We took measurements for stockings. His left anterior lateral leg wound continues to have a healthy-looking surface. I'm not  sure where we were with the Apligraf run through his insurance. We have been using Iodoflex. He has a thick eschar on the left first second toe interface, I suspect this may be fungal however there is no visible open 12/23/16; no open wound on his right leg. He has 2 small areas left of the linear wound that was remaining last week. We have been using Prisma, I thought I have disclosed this week, we can only look forward to next week 01/03/17; the patient had concerning areas of erythema last week, already on doxycycline for UTI through his primary doctor. The erythema is absolutely no better there is warmth and swelling both medially from the left lateral leg wound and also the dorsal left foot.  01/06/17- Patient is here for follow-up evaluation of his left lateral leg ulcer and bilateral feet ulcers. He is on oral antibiotic therapy, tolerating that. Nursing staff and the patient states that the erythema is improved from Monday. 01/13/17; the predominant left lateral leg wound continues to be problematic. I had put Apligraf on him earlier this month once. However he subsequently developed what appeared to be an intense cellulitis around the left lateral leg wound. I gave him Dalvance I think on 2/12 perhaps 2/13 he continues on cefdinir. The erythema is still present but the warmth and swelling is improved. I am hopeful that the cellulitis part of this control. I wouldn't be surprised if there is an element of venous inflammation as well. 01/17/17. The erythema is present but better in the left leg. His left lateral leg wound still does not have a viable surface buttons certain parts of this long thin wound it appears like there has been improvement in dimensions. 01/20/17; the erythema still present but much better in the left leg. I'm thinking this is his usual degree of chronic venous inflammation. The wound on the left leg looks somewhat better. Is less surface slough 01/27/17; erythema is back to the  chronic venous inflammation. The wound on the left leg is somewhat better. I am back to the point where I like to try an Apligraf once again 02/10/17; slight improvement in wound dimensions. Apligraf #2. He is completing his doxycycline 02/14/17; patient arrives today having completed doxycycline last Thursday. This was supposed to be a nurse visit however once again he hasn't tense erythema from the medial part of his wound extending over the lower leg. Also erythema in his foot this is roughly in the same distribution as last time. He has baseline chronic venous inflammation however this is a lot worse than the baseline I have learned to accept the on him is baseline inflammation 02/24/17- patient is here for follow-up evaluation. He is tolerating compression therapy. His voicing no complaints or concerns he is here anticipating an Apligraf 03/03/17; he arrives today with an adherent necrotic surface. I don't think this is surface is going to be amenable for Apligraf's. The erythema around his wound and on the left dorsal foot has resolved he is off antibiotics 03/10/17; better-looking surface today. I don't think he can tolerate Apligraf's. He tells me he had a wound VAC after a skin graft years ago to this area and they had difficulty with a seal. The erythema continues to be stable around this some degree of chronic venous inflammation but he also has recurrent cellulitis. We have been using Iodoflex 03/17/17; continued improvement in the surface and may be small changes in dimensions. Using Iodoflex which seems the only thing that will control his surface 03/24/17- He is here for follow up evaluation of his LLE lateral ulceration and ulcer to right dorsal foot/toe space. He is voicing no complaints or concerns, He is tolerating compression wrap. 03/31/17 arrives today with a much healthier looking wound on the left lower extremity. We have been using Iodoflex for a prolonged period of time which has  for the first time prepared and adequate looking wound bed although we have not had much in the way of wound dimension improvement. He also has a small wound between the first and second toe on the right 04/07/17; arrives today with a healthy-looking wound bed and at least the top 50% of this wound appears to be now her. No debridement was required I have  changed him to [facility] last week after prolonged Iodoflex. He did not do well with Apligraf's. We've had a re-opening between the first and second toe on the right 04/14/17; arrives today with a healthier looking wound bed contractions and the top 50% of this wound and some on the lesser 50%. Wound bed appears healthy. The area between the first and second toe on the right still remains problematic 04/21/17; continued very gradual improvement. Using [facility] 04/28/17; continued very gradual improvement in the left lateral leg venous insufficiency wound. His periwound erythema is very mild. We have been using Hydrofera Blue. Wound is making progress especially in the superior 50% 05/05/17; he continues to have very gradual improvement in the left lateral venous insufficiency wound. Both in terms with an length rings are improving. I debrided this every 2 weeks with #5 curet and we have been using Hydrofera Blue and again making good progress With regards to the wounds between his right first and second toe which I thought might of been tinea pedis he is not making as much progress very dry scaly skin over the area. Also the area at the base of the left first and second toe in a similar condition 05/12/17; continued gradual improvement in the refractory left lateral venous insufficiency wound on the left. Dimension smaller. Surface still requiring debridement using Hydrofera Blue 05/19/17; continued gradual improvement in the refractory left lateral venous ulceration. Careful inspection of the wound bed underlying rumination suggested some  degree of epithelialization over the surface no debridement indicated. Continue Hydrofera Blue difficult areas between his toes first and third on the left than first and second on the right. I'm going to change to silver alginate from silver collagen. Continue ketoconazole as I suspect underlying tinea pedis 05/26/17; left lateral leg venous insufficiency wound. We've been using Hydrofera Blue. I believe that there is expanding epithelialization over the surface of the wound albeit not coming from the wound circumference. This is a bit of an odd situation in which the epithelialization seems to be coming from the surface of the wound rather than in the exact circumference. There is still small open areas mostly along the lateral margin of the wound. ooHe has unchanged areas between the left first and second and the right first second toes which I been treating for tenia pedis 06/02/17; left lateral leg venous insufficiency wound. We have been using Hydrofera Blue. Somewhat smaller from the wound circumference. The surface of the wound remains a bit on it almost epithelialized sedation in appearance. I use an open curette today debridement in the surface of all of this especially the edges ooSmall open wounds remaining on the dorsal right first and second toe interspace and the plantar left first second toe and her face on the left 06/09/17; wound on the left lateral leg continues to be smaller but very gradual and very dry surface using Hydrofera Blue 06/16/17 requires weekly debridements now on the left lateral leg although this continues to contract. I changed to silver collagen last week because of dryness of the wound bed. Using Iodoflex to the areas on his first and second toes/web space bilaterally 06/24/17; patient with history of paraplegia also chronic venous insufficiency with lymphedema. Has a very difficult wound on the left lateral leg. This has been gradually reducing in terms of with  but comes in with a very dry adherent surface. High switch to silver collagen a week or so ago with hydrogel to keep the area moist. This is  been refractory to multiple dressing attempts. He also has areas in his first and second toes bilaterally in the anterior and posterior web space. I had been using Iodoflex here after a prolonged course of silver alginate with ketoconazole was ineffective [question tinea pedis] 07/14/17; patient arrives today with a very difficult adherent material over his left lateral lower leg wound. He also has surrounding erythema and poorly controlled edema. He was switched his Santyl last visit which the nurses are applying once during his doctor visit and once on a nurse visit. He was also reduced to 2 layer compression I'm not exactly sure of the issue here. 07/21/17; better surface today after 1 week of Iodoflex. Significant cellulitis that we treated last week also better. [Doxycycline] 07/28/17 better surface today with now 2 weeks of Iodoflex. Significant cellulitis treated with doxycycline. He has now completed the doxycycline and he is back to his usual degree of chronic venous inflammation/stasis dermatitis. He reminds me he has had ablations surgery here 08/04/17; continued improvement with Iodoflex to the left lateral leg wound in terms of the surface of the wound although the dimensions are better. 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 Problematic areas on the plantar aspect of the first second toe web space on the left and the dorsal aspect of the first second toe web space on the right. At one point I felt these were probably related to chronic fungal infections in treated him aggressively for this although we have not made any improvement here. 08/11/17; left lateral leg. Surface continues to improve with the Iodoflex although we are not seeing much improvement in overall wound  dimensions. Areas on his plantar left foot and right foot show no improvement. In fact the right foot looks somewhat worse 08/18/17; left lateral leg. We changed to [employer] [facility] last week after a prolonged course of Iodoflex which helps get the surface better. It appears that the wound with is improved. Continue with difficult areas on the left dorsal first second and plantar first second on the right 09/01/17; patient arrives in clinic today having had a temperature of 103 yesterday. He was seen in the ER and [facility]. The patient was concerned he could have cellulitis again in the right leg however they diagnosed him with a UTI and he is now on Keflex. He has a history of cellulitis which is been recurrent and difficult but this is been in the left leg, in the past 5 use doxycycline. He does in and out catheterizations at home which are risk factors for UTI 09/08/17; patient will be completing his Keflex this weekend. The erythema on the left leg is considerably better. He has a new wound today on the medial part of the right leg small superficial almost looks like a skin tear. He has worsening of the area on the right dorsal first and second toe. His major area on the left lateral leg is better. Using Hydrofera Blue on all areas 09/15/17; gradual reduction in width on the long wound in the left lateral leg. No debridement required. He also has wounds on the plantar aspect of his left first second toe web space and on the dorsal aspect of the right first second toe web space. 09/22/17; there continues to be very gradual improvements in the dimensions of the left lateral leg wound. He hasn't round erythematous spot with might be pressure on his wheelchair. There is no evidence obviously of infection no  purulence no warmth ooHe has a dry scaled area on the plantar aspect of the left first second toe ooImproved area on the dorsal right first second toe. 09/29/17; left lateral leg wound  continues to improve in dimensions mostly with an is still a fairly long but increasingly narrow wound. ooHe has a dry scaled area on the plantar aspect of his left first second toe web space ooIncreasingly concerning area on the dorsal right first second toe. In fact I am concerned today about possible cellulitis around this wound. The areas extending up his second toe and although there is deformities here almost appears to abut on the nailbed. 10/06/17; left lateral leg wound continues to make very gradual progress. Tissue culture I did from the right first second toe dorsal foot last time grew MRSA and enterococcus which was vancomycin sensitive. This was not sensitive to clindamycin or doxycycline. He is allergic to Zyvox and sulfa we have therefore arrange for him to have dalvance infusion tomorrow. He is had this in the past and tolerated it well 10/20/17; left lateral leg wound continues to make decent progress. This is certainly reduced in terms of with there is advancing epithelialization.ooThe cellulitis in the right foot looks better although he still has a deep wound in the dorsal aspect of the first second toe web space. Plantar left first toe web space on the left I think is making some progress 10/27/17; left lateral leg wound continues to make decent progress. Advancing epithelialization.using Hydrofera Blue ooThe right first second toe web space wound is better-looking using silver alginate ooImprovement in the left plantar first second toe web space. Again using silver alginate 11/03/17 left lateral leg wound continues to make decent progress albeit slowly. Using Hydrofera Blue ooThe right per second toe web space continues to be a very problematic looking punched out wound. I obtained a piece of tissue for deep culture I did extensively treated this for fungus. It is difficult to imagine that this is a pressure area as the patient states other than going outside he  doesn't really wear shoes at home ooThe left plantar first second toe web space looked fairly senescent. Necrotic edges. This required debridement oochange to Hydrofera Blue to all wound areas 11/10/17; left lateral leg wound continues to contract. Using Hydrofera Blue ooOn the right dorsal first second toe web space dorsally. Culture I did of this area last week grew MRSA there is not an easy oral option in this patient was multiple antibiotic allergies or intolerances. This was only a rare culture isolate I'm therefore going to use Bactroban under silver alginate ooOn the left plantar first second toe web space. Debridement is required here. This is also unchanged 11/17/17; left lateral leg wound continues to contract using Hydrofera Blue this is no longer the major issue. ooThe major concern here is the right first second toe web space. He now has an open area going from dorsally to the plantar aspect. There is now wound on the inner lateral part of the first toe. Not a very viable surface on this. There is erythema spreading medially into the forefoot. ooNo major change in the left first second toe plantar wound 11/24/17; left lateral leg wound continues to contract using Hydrofera Blue. Nice improvement today ooThe right first second toe web space all of this looks a lot less angry than last week. I have given him clindamycin and topical Bactroban for MRSA and terbinafine for the possibility of underlining tinea pedis that I could not  control with ketoconazole. Looks somewhat better ooThe area on the plantar left first second toe web space is weeping with dried debris around the wound 12/01/17; left lateral leg wound continues to contract he Hydrofera Blue. It is becoming thinner in terms of with nevertheless it is making good improvement. ooThe right first second toe web space looks less angry but still a large necrotic-looking wounds starting on the plantar aspect of the right foot  extending between the toes and now extensively on the base of the right second toe. I gave him clindamycin and topical Bactroban for MRSA anterior benefiting for the possibility of underlying tinea pedis. Not looking better today ooThe area on the left first/second toe looks better. Debrided of necrotic debris 12/05/17* the patient was worked in urgently today because over the weekend he found blood on his incontinence bad when he woke up. He was found to have an ulcer by his wife who does most of his wound care. He came in today for [place] to look at this. He has not had a history of wounds in his buttocks in spite of his paraplegia. 12/08/17; seen in follow-up today at his usual appointment. He was seen earlier this week and found to have a new wound on his buttock. We also follow him for wounds on the left lateral leg, left first second toe web space and right first second toe web space 12/15/17; we have been using Hydrofera Blue to the left lateral leg which has improved. The right first second toe web space has also improved. Left first second toe web space plantar aspect looks stable. The left buttock has worsened using Santyl. Apparently the buttock has drainage 12/22/17; we have been using Hydrofera Blue to the left lateral leg which continues to improve now 2 small wounds separated by normal skin. He tells [place] he had a fever up to 100 yesterday he is prone to UTIs but has not noted anything different. He does in and out catheterizations. The area between the first and second toes today does not look good necrotic surface covered with what looks to be purulent drainage and erythema extending into the third toe. I had gotten this to something that I thought look better last time however it is not look good today. He also has a necrotic surface over the buttock wound which is expanded. I thought there might be infection under here so I removed a lot of the surface with a #5 curet though nothing  look like it really needed culturing. He is been using Santyl to this area 12/27/17; his original wound on the left lateral leg continues to improve using Hydrofera Blue. I gave him samples of Baxdella although he was unable to take them out of fear for an allergic reaction ["lump in his throat"].the culture I did of the purulent drainage from his second toe last week showed both enterococcus and a set Enterobacter I was also concerned about the erythema on the bottom of his foot although paradoxically although this looks somewhat better today. 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 We've been using silver alginate here. They came up today that he sleeps in a recliner, I'm not sure why but I asked him to stop this 01/03/18; his original wound we've been using Hydrofera Blue is now separated into 2 areas. ooUlcer on his left buttock is better he is off the recliner and sleeping in bed   ooFinally both wound areas between his first and second toes also looks some better 01/10/18; his original wound on the left lateral leg is now separated into 2 wounds we've been using Hydrofera Blue ooUlcer on his left buttock has some drainage. There is a small probing site going into muscle layer superiorly.using silver alginate -He arrives today with a deep tissue injury on the left heel ooThe wound on the dorsal aspect of his first second toe on the left looks a lot betterusing silver alginate ketoconazole ooThe area on the first second toe web space on the right also looks a lot bette 01/17/18; his original wound on the left lateral leg continues to progress using Hydrofera Blue ooUlcer on his left buttock also is smaller surface healthier except for a small probing site going into the muscle layer superiorly. 2.4 cm of tunneling in this area ooDTI on his left heel we have only been offloading. Looks better  than last week no threatened open no evidence of infection oothe wound on the dorsal aspect of the first second toe on the left continues to look like it's regressing we have only been using silver alginate and terbinafine orally ooThe area in the first second toe web space on the right also looks to be a lot better using silver alginate and terbinafine I think this was prompted by tinea pedis 01/31/18; the patient was hospitalized in [place] last week apparently for a complicated UTI. He was discharged on cefepime he does in and out catheterizations. In the hospital he was discovered M I don't mild elevation of AST and ALTs and the terbinafine was stopped.predictably the pressure ulcer on his buttock looks betterusing silver alginate. The area on the left lateral leg also is better using Hydrofera Blue. The area between the first and second toes on the left better. First and second toes on the right still substantial but better. Finally the DTI on the left heel has held together and looks like it's resolving 02/07/18-he is here in follow-up evaluation for multiple ulcerations. He has new injury to the lateral aspect of the last issue a pressure ulcer, he states this is from adhesive removal trauma. He states he has tried multiple adhesive products with no success. All other ulcers appear stable. The left heel DTI is resolving. We will continue with same treatment plan and follow-up next week. 02/14/18; follow-up for multiple areas. ooHe has a new area last week on the lateral aspect of his pressure ulcer more over the posterior trochanter. The original pressure ulcer looks quite stable has healthy granulation. 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 Almost fully epithelialized on the original superior area using Hydrofera Blue ooDTI on the left heel has peeled off this week to reveal  a small superficial wound under denuded skin and subcutaneous tissue ooBoth areas between the first and second toes look better including nothing open on the left 02/21/18; ooThe patient's wounds on his left ischial tuberosity and posterior left greater trochanter actually looked better. He has a large area of irritation around the area which I think is contact dermatitis. I am doubtful that this is fungal ooHis original wound on the left lateral calf continues to improve we have been using Hydrofera Blue ooThere is no open area in the left first second toe web space although there is a lot of thick callus ooThe DTI on the left heel required debridement today  of necrotic surface eschar and subcutaneous tissue using silver alginate ooFinally the area on the right first second toe webspace continues to contract using silver alginate and ketoconazole 02/28/18 ooLeft ischial tuberosity wounds look better using silver alginate. ooOriginal wound on the left calf only has one small open area left using Hydrofera Blue ooDTI on the left heel required debridement mostly removing skin from around this wound surface. Using silver alginate ooThe areas on the right first/second toe web space using silver alginate and ketoconazole 03/08/18 on evaluation today patient appears to be doing decently well as best I can tell in regard to his wounds. This is the first time that I have seen him as he generally is followed by Dr. Dellia Nims. With that being said none of his wounds appear to be infected he does have an area where there is some skin covering what appears to be a new wound on the left dorsal surface of his great toe. This is right at the nail bed. With that being said I do believe that debrided away some of the excess skin can be of benefit in this regard. Otherwise he has been tolerating the dressing changes without complication. 03/14/18; patient arrives today with the multiplicity of wounds that we  are following. He has not been systemically unwell ooOriginal wound on the left lateral calf now only has 2 small open areas we've been using Hydrofera Blue which should continue ooThe deep tissue injury on the left heel requires debridement today. We've been using silver alginate ooThe left first second toe and the right first second toe are both are reminiscence what I think was tinea pedis. Apparently some of the callus Surface between the toes was removed last week when it started draining. ooPurulent drainage coming from the wound on the ischial tuberosity on the left. 03/21/18-He is here in follow-up evaluation for multiple wounds. 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 He tolerated debridement. The only change to last week's recommendations is to discontinue antifungal cream between toes. He will follow-up next week 03/28/18; following up for multiple wounds;Concern this week is streaking redness and swelling in the right foot. He is going to need antibiotics for this. 03/31/18; follow-up for right foot cellulitis. Streaking redness and swelling in the right foot on 03/28/18. 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 I put him on clindamycin 300 mg every 6 and brought him in for a quick check. He has an open wound between his first and second toes on the right foot as a potential source. 04/04/18; ooRight foot cellulitis is resolving he is completing clindamycin. This is truly good news ooLeft lateral calf wound which is initial wound only has one small open area inferiorly this is close to healing out. He has compression stockings. We will use Hydrofera Blue right down to the epithelialization of this ooNonviable surface on the left heel which was initially pressure with a DTI. We've been using Hydrofera Blue. I'm going to switch this back to silver alginate ooLeft first second  toe/tinea pedis this looks better using silver alginate ooRight first second toe tinea pedis using silver alginate ooLarge pressure ulcers on theLeft ischial tuberosity. Small wound here Looks better. I am uncertain about the surface over the large wound. Using silver alginate 04/11/18; ooCellulitis in the right foot is resolved ooLeft lateral calf wound which was his original wounds still has 2 tiny open areas remaining this is just about closed   ooNonviable surface on the left heel is better but still requires debridement ooLeft first second toe/tinea pedis still open using silver alginate ooRight first second toe wound tinea pedis I asked him to go back to using ketoconazole and silver alginate ooLarge pressure ulcers on the left ischial tuberosity this shear injury here is resolved. Wound is smaller. No evidence of infection using silver alginate 04/18/18; ooPatient arrives with an intense area of cellulitis in the right mid lower calf extending into the right heel area. Bright red and warm. Smaller area on the left anterior leg. He has a significant history of MRSA. He will definitely need antibioticsoodoxycycline ooHe now has 2 open areas on the left ischial tuberosity the original large wound and now a satellite area which I think was above his initial satellite areas. Not a wonderful surface on this satellite area surrounding erythema which looks like pressure related. ooHis left lateral calf wound again his original wound is just about closed ooLeft heel pressure injury still requiring debridement ooLeft first second toe looks a lot better using silver alginate ooRight first second toe also using silver alginate and ketoconazole cream also looks better 04/20/18; the patient was worked in early today out of concerns with his cellulitis on the right leg. I had started him on doxycycline. This was 2 days ago. His wife was concerned about the swelling in the area. Also  concerned about the left buttock. He has not been systemically unwell no fever chills. No nausea vomiting or diarrhea 04/25/18; the patient's left buttock wound is continued to deteriorate he is using Hydrofera Blue. He is still completing clindamycin for the cellulitis on the right leg although all of this looks better. 05/02/18 ooLeft buttock wound still with a lot of drainage and a very tightly adherent fibrinous necrotic surface. He has a deeper area superiorly ooThe left lateral calf wound is still closed ooDTI wound on the left heel necrotic surface especially the circumference using Iodoflex ooAreas between his left first second toe and right first second toe both look better. Dorsally and the right first second toe he had a necrotic surface although at smaller. In using silver alginate and ketoconazole. I did a culture last week which was a deep tissue culture of the reminiscence of the open wound on the right first second toe dorsally. This grew a few Acinetobacter and a few methicillin-resistant staph aureus. Nevertheless the area actually this week looked better. I didn't feel the need to specifically address this at least in terms of systemic antibiotics. 05/09/18; wounds are measuring larger more drainage per our intake. We are using Santyl covered with alginate on the large superficial buttock wounds, Iodosorb on the left heel, ketoconazole and silver alginate to the dorsal first and second toes bilaterally. 05/16/18; ooThe area on his left buttock better in some aspects although the area superiorly over the ischial tuberosity required an extensive debridement.using Santyl ooLeft heel appears stable. Using Iodoflex ooThe areas between his first and second toes are not bad however there is spreading erythema up the dorsal aspect of his left foot this looks like cellulitis again. He is insensate the erythema is really very brilliant.o Erysipelas He went to see an allergist days  ago because he was itching part of this he had lab work done. This showed a white count of 15.1 with 70% neutrophils. Hemoglobin of 11.4 and a platelet count of 659,000. Last white count we had in Epic was a 2-1/2 years ago which was 25.9 but he was  ill at the time. He was able to show me some lab work that was done by his primary physician the pattern is about the same. 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 But prompted me to go ahead and do x-rays of both feet and the pelvis rule out osteomyelitis. He also had a comprehensive metabolic panel this was reasonably normal his albumin was 3.7 liver function tests BUN/creatinine all normal 05/23/18; x-rays of both his feet from last week were negative for underlying pulmonary abnormality. The x-ray of his pelvis however showed mild irregularity in the left ischial which may represent some early osteomyelitis. The wound in the left ischial continues to get deeper clearly now exposed muscle. Each week necrotic surface material over this area. Whereas the rest of the wounds do not look so bad. ooThe left ischial wound we have been using Santyl and calcium alginate ooTo the left heel surface necrotic debris using Iodoflex ooThe left lateral leg is still healed ooAreas on the left dorsal foot and the right dorsal foot are about the same. There is some inflammation on the left which might represent contact dermatitis, fungal dermatitis I am doubtful cellulitis although this looks better than last week 05/30/18; CT scan done at Hospital did not show any osteomyelitis or abscess. Suggested the possibility of underlying cellulitis although I don't see a lot of evidence of this at the bedside ooThe wound itself on the left buttock/upper thigh actually looks somewhat better. No debridement ooLeft heel also looks better no debridement continue Iodoflex ooBoth dorsal first second toe spaces appear better using  Lotrisone. Left still required debridement 06/06/18; ooIntake reported some purulent looking drainage from the left gluteal wound. Using Santyl and calcium alginate ooLeft heel looks better although still a nonviable surface requiring debridement ooThe left dorsal foot first/second webspace actually expanding and somewhat deeper. I may consider doing a shave biopsy of this area ooRight dorsal foot first/second webspace appears stable to improved. Using Lotrisone and silver alginate to both these areas 06/13/18 ooLeft gluteal surface looks better. Now separated in the 2 wounds. No debridement required. Still drainage. We'll continue silver alginate ooLeft heel continues to look better with Iodoflex continue this for at least another week ooOf his dorsal foot wounds the area on the left still has some depth although it looks better than last week. We've been using Lotrisone and silver alginate 06/20/18 ooLeft gluteal continues to look better healthy tissue ooLeft heel continues to look better healthy granulation wound is smaller. He is using Iodoflex and his long as this continues continue the Iodoflex ooDorsal right foot looks better unfortunately dorsal left foot does not. There is swelling and erythema of his forefoot. He had minor trauma to this several days ago but doesn't think this was enough to have caused any tissue injury. Foot looks like cellulitis, we have had this problem before 06/27/18 on evaluation today patient appears to be doing a little worse in regard to his foot ulcer. 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 Both of which would really be his only options for treating this infection. In the past he has been given and effusion of Orbactiv. This is done very well for him in the past again it's one time dosing IV antibiotic  therapy. Subsequently I do believe this is something we're  gonna need to see about doing at this point in time. Currently his other wounds seem to be doing somewhat better in my pinion I'm pretty happy in that regard. 07/03/18 on evaluation today patient's wounds actually appear to be doing fairly well. He has been tolerating the dressing changes without complication. All in all he seems to be showing signs of improvement. In regard to the antibiotics he has been dealing with infectious disease since I saw him last week as far as getting this scheduled. In the end [facility]'s [facility] this coming Friday. In the meantime he has been continuing to perform the dressing changes in such as previous. There does not appear to be any evidence of infection worsengin at this time. 07/10/18; ooSince I last saw this man 2 weeks ago things have actually improved. IV antibiotics of resulted in less forefoot erythema although there is still some present. He is not systemically unwell ooLeft buttock wounds o2 now have no depth there is increased epithelialization Using silver alginate ooLeft heel still requires debridement using Iodoflex ooLeft dorsal foot still with a sizable wound about the size of a border but healthy granulation ooRight dorsal foot still with a slitlike area using silver alginate 07/18/18; the patient's cellulitis in the left foot is improved in fact I think it is on its way to resolving. ooLeft buttock wounds o2 both look better although the larger one has hypertension granulation we've been using silver alginate ooLeft heel has some thick circumferential redundant skin over the wound edge which will need to be removed today we've been using Iodoflex ooLeft dorsal foot is still a sizable wound required debridement using silver alginate ooThe right dorsal foot is just about closed only a small open area remains here 07/25/18;  left foot cellulitis is resolved ooLeft buttock wounds o2 both look better. Hyper-granulation on the major area ooLeft heel as some debris over the surface but otherwise looks a healthier wound. Using silver collagen ooRight dorsal foot is just about closed 07/31/18; arrives with our intake nurse worried about purulent drainage from the buttock. We had hyper-granulation here last week ooHis buttock wounds o2 continue to look better ooLeft heel some debris over the surface but measuring smaller. ooRight dorsal foot unfortunately has openings between the toes ooLeft foot superficial wound looks less aggravated. 08/07/18 ooButtock wounds continue to look better although some of her granulation and the larger medial wound. silver alginate ooLeft heel continues to look a lot better.silver collagen ooLeft foot superficial wound looks less stable. Requires debridement. He has a new wound superficial area on the foot on the lateral dorsal foot. ooRight foot looks better using silver alginate without Lotrisone 08/14/2018; patient was in the ER last week diagnosed with a UTI. He is now on Cefpodoxime and Macrodantin. ooButtock wounds continued to be smaller. Using silver alginate ooLeft heel continues to look better using silver collagen ooLeft foot superficial wound looks as though it is improving ooRight dorsal foot area is just about healed. 08/21/2018; patient is completed his antibiotics for his UTI. ooHe has 2 open areas on the buttocks. There is still not closed although the surface looks satisfactory. Using silver alginate ooLeft heel continues to improve using silver collagen ooThe bilateral dorsal foot areas which are at the base of his first and second toes/possible tinea pedis are actually stable on the left but worse on the right. The area on the left required debridement of necrotic  surface. After debridement I obtained a specimen for PCR culture. ooThe right dorsal  foot which is been just about healed last week is now reopened 08/28/2018; culture done on the left dorsal foot showed coag negative staph both staph epidermidis and Lugdunensis. I think this is worthwhile initiating systemic treatment. I will use doxycycline given his long list of allergies. The area on the left heel slightly improved but still requiring debridement. ooThe large wound on the buttock is just about closed whereas the smaller one is larger. Using silver alginate in this area 09/04/2018; patient is completing his doxycycline for the left foot although this continues to be a very difficult wound area with very adherent necrotic debris. We are using silver alginate to all his wounds right foot left foot and the small wounds on his buttock, silver collagen on the left heel. 09/11/2018; once again this patient has intense erythema and swelling of the left forefoot. Lesser degrees of erythema in the right foot. He has a long list of allergies and intolerances. I will reinstitute doxycycline. 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 Using silver alginate ooLeft heel also looks better using silver collagen ooUnfortunately both the areas on his feet look worse. The area on the left first second webspace is now gone through to the plantar part of his foot. The area on the left foot anteriorly is irritated with erythema and swelling in the forefoot. 09/25/2018 ooHis wound on the left plantar heel looks better. Using silver collagen ooThe area on the left buttock 2 small remnant areas. One is closed one is still open. Using silver alginate ooThe areas between both his first and second toes look worse. This in spite of long-standing antifungal therapy with ketoconazole and silver alginate which should have antifungal activity ooHe has small areas around his original wound on the left calf one is on the bottom of the original scar tissue  and one superiorly both of these are small and superficial but again given wound history in this site this is worrisome 10/02/2018 ooLeft plantar heel continues to gradually contract using silver collagen ooLeft buttock wound is unchanged using silver alginate ooThe areas on his dorsal feet between his first and second toes bilaterally look about the same. 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 Small open area here. We have been using silver alginate 10/09/2018; ooLeft plantar heel continues to look healthy and contract. No debridement is required ooLeft buttock slightly smaller with a tape injury wound just below which was new this week ooDorsal feet somewhat improved I have been using clindamycin ooLeft lateral looks lower extremity the actual open area looks worse although a lot of this is epithelialized. I am going to change to silver collagen today He has a lot more swelling in the right leg although this is not pitting not red and not particularly warm there is a lot of spasm in the right leg usually indicative of people with paralysis of some underlying discomfort. We have reviewed his vascular status from 2017 he had a left greater saphenous vein ablation. I wonder about referring him back to vascular surgery if the area on the left leg continues to deteriorate. 10/16/2018 in today for follow-up and management of multiple lower extremity ulcers. His left Buttock wound is much  lower smaller and almost closed completely. The wound to the left ankle has began to reopen with Epithelialization and some adherent slough. He has multiple new areas to the left foot and leg. The left dorsal foot without much improvement. Wound present between left great webspace and 2nd toe. Erythema and edema present  right leg. Right LE ultrasound obtained on 10/10/18 was negative for DVT. 10/23/2018; ooLeft buttock is closed over. Still dry macerated skin but there is no open wound. I suspect this is chronic pressure/moisture ooLeft lateral calf is quite a bit worse than when I saw this last. There is clearly drainage here he has macerated skin into the left plantar heel. We will change the primary dressing to alginate ooLeft dorsal foot has some improvement in overall wound area. Still using clindamycin and silver alginate ooRight dorsal foot about the same as the left using clindamycin and silver alginate ooThe erythema in the right leg has resolved. He is DVT rule out was negative ooLeft heel pressure area required debridement although the wound is smaller and the surface is health 10/26/2018 ooThe patient came back in for his nurse check today predominantly because of the drainage coming out of the left lateral leg with a recent reopening of his original wound on the left lateral calf. He comes in today with a large amount of surrounding erythema around the wound extending from the calf into the ankle and even in the area on the dorsal foot. He is not systemically unwell. He is not febrile. Nevertheless this looks like cellulitis. We have been using silver alginate to the area. I changed him to a regular visit and I am going to prescribe him doxycycline. The rationale here is a long list of medication intolerances and a history of MRSA. I did not see anything that I thought would provide a valuable culture 10/30/2018 ooFollow-up from his appointment 4 days ago with really an extensive area of cellulitis in the left calf left lateral ankle and left dorsal foot. I put him on doxycycline. He has a long list of medication allergies which are true allergy reactions. 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 In any case he is a lot  better today. The erythema especially in the anterior and lateral left calf is better. He still has left ankle erythema. He also is complaining about increasing edema in the right leg we have only been using Kerlix Coban and he has been doing the wraps at home. Finally he has a spotty rash on the medial part of his upper left calf which looks like folliculitis or perhaps wrap occlusion type injury. Small superficial macules not pustules 11/06/18 patient arrives today with again a considerable degree of erythema around the wound on the left lateral calf extending into the dorsal ankle and dorsal foot. This is a lot worse than when I saw this last week. He is on doxycycline really with not a lot of improvement. He has not been systemically unwell Wounds on the; left heel actually looks improved. Original area on the left foot and proximity to the first and second toes looks about the same. He has superficial areas on the dorsal foot, anterior calf and then the reopening of his original wound on the left lateral calf which looks about the same ooThe only area he has on the right is the dorsal webspace first and second which is smaller. ooHe has a large area of dry erythematous  skin on the left buttock small open area here. 11/13/2018; the patient arrives in much better condition. The erythema around the wound on the left lateral calf is a lot better. Not sure whether this was the clindamycin or the TCA and ketoconazole or just in the improvement in edema control [stasis dermatitis]. In any case this is a lot better. The area on the left heel is very small and just about resolved using silver collagen we have been using silver alginate to the areas on his dorsal feet 11/20/2018; his wounds include the left lateral calf, left heel, dorsal aspects of both feet just proximal to the first second webspace. He is stable to slightly improved. I did not think any changes to his dressings were going to  be necessary 11/27/2018 he has a reopening on the left buttock which is surrounded by what looks like tinea or perhaps some other form of dermatitis. The area on the left dorsal foot has some erythema around it I have marked this area but I am not sure whether this is cellulitis or not. Left heel is not closed. Left calf the reopening is really slightly longer and probably worse 1/13; in general things look better and smaller except for the left dorsal foot. Area on the left heel is just about closed, left buttock looks better only a small wound remains in the skin looks better [using Lotrisone] 1/20; the area on the left heel only has a few remaining open areas here. Left lateral calf about the same in terms of size, left dorsal foot slightly larger right lateral foot still not closed. The area on the left buttock has no open wound and the surrounding skin looks a lot better 1/27; the area on the left heel is closed. Left lateral calf better but still requiring extensive debridements. The area on his left buttock is closed. He still has the open areas on the left dorsal foot which is slightly smaller in the right foot which is slightly expanded. We have been using Iodoflex on these areas as well 2/3; left heel is closed. Left lateral calf still requiring debridement using Iodoflex there is no open area on his left buttock however he has dry scaly skin over a large area of this. Not really responding well to the Lotrisone. Finally the areas on his dorsal feet at the level of the first second webspace are slightly smaller on the right and about the same on the left. Both of these vigorously debrided with Anasept and gauze 2/10; left heel remains closed he has dry erythematous skin over the left buttock but there is no open wound here. Left lateral leg has come in and with. Still requiring debridement we have been using Iodoflex here. Finally the area on the left dorsal foot and right dorsal foot are  really about the same extremely dry callused fissured areas. He does not yet have a dermatology appointment 2/17; left heel remains closed. He has a new open area on the left buttock. The area on the left lateral calf is bigger longer and still covered in necrotic debris. No major change in his foot areas bilaterally. I am awaiting for a dermatologist to look on this. We have been using ketoconazole I do not know that this is been doing any good at all. 2/24; left heel remains closed. The left buttock wound that was new reopening last week looks better. The left lateral calf appears better also although still requires debridement. The major area on his  foot is the left first second also requiring debridement. We have been putting Prisma on all wounds. I do not believe that the ketoconazole has done too much good for his feet. He will use Lotrisone I am going to give him a 2-week course of terbinafine. We still do not have a dermatology appointment 3/2 left heel remains closed however there is skin over bone in this area I pointed this out to him today. The left buttock wound is epithelialized but still does not look completely stable. The area on the left leg required debridement were using silver collagen here. With regards to his feet we changed to Lotrisone last week and silver alginate. 3/9; left heel remains closed. Left buttock remains closed. The area on the right foot is essentially closed. The left foot remains unchanged. Slightly smaller on the left lateral calf. Using silver collagen to both of these areas 3/16-Left heel remains closed. Area on right foot is closed. Left lateral calf above the lateral malleolus open wound requiring debridement with easy bleeding. Left dorsal wound proximal to first toe also debrided. Left ischial area open new. Patient has been using Prisma with wrapping every 3 days. Dermatology appointment is apparently tomorrow.Patient has completed his terbinafine  2-week course with some apparent improvement according to him, there is still flaking and dry skin in his foot on the left 3/23; area on the right foot is reopened. The area on the left anterior foot is about the same still a very necrotic adherent surface. He still has the area on the left leg and reopening is on the left buttock. He apparently saw dermatology although I do not have a note. According to the patient who is usually fairly well informed they did not have any good ideas. Put him on oral terbinafine which she is been on before. 3/30; using silver collagen to all wounds. Apparently his dermatologist put him on doxycycline and rifampin presumably some culture grew staph. I do not have this result. He remains on terbinafine although I have used terbinafine on him before 4/6; patient has had a fairly substantial reopening on the right foot between the first and second toes. He is finished his terbinafine and I believe is on doxycycline and rifampin still as prescribed by dermatology. We have been using silver collagen to all his wounds although the patient reports that he thinks silver alginate does better on the wounds on his buttock. 4/13; the area on his left lateral calf about the same size but it did not require debridement. ooLeft dorsal foot just proximal to the webspace between the first and second toes is about the same. Still nonviable surface. I note some superficial bronze discoloration of the dorsal part of his foot ooRight dorsal foot just proximal to the first and second toes also looks about the same. I still think there may be the same discoloration I noted above on the left ooLeft buttock wound looks about the same 4/20; left lateral calf appears to be gradually contracting using silver collagen. 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 The left dorsal foot wound is debrided of tightly adherent necrotic  debris and really cleans up quite nicely. The right area is worse with expansion. I did not debride this it is now over the base of the second toe ooThe area on his left buttock is smaller no debridement is required using silver collagen 5/4; left calf continues to make good progress. ooHe arrives with erythema around  the wounds on his dorsal foot which even extends to the plantar aspect. Very concerning for coexistent infection. He is finished the erythromycin I gave him for possible erythrasma this does not seem to have helped. ooThe area on the left foot is about the same base of the dorsal toes ooIs area on the buttock looks improved on the left 5/11; left calf and left buttock continued to make good progress. Left foot is about the same to slightly improved. ooMajor problem is on the right foot. He has not had an x-ray. Deep tissue culture I did last week showed both Enterobacter and E. coli. I did not change the doxycycline I put him on empirically although neither 1 of these were plated to doxycycline. He arrives today with the erythema looking worse on both the dorsal and plantar foot. Macerated skin on the bottom of the foot. he has not been systemically unwell 5/18-Patient returns at 1 week, left calf wound appears to be making some progress, left buttock wound appears slightly worse than last time, left foot wound looks slightly better, right foot redness is marginally better. X-ray of both feet show no air or evidence of osteomyelitis. Patient is finished his Omnicef and terbinafine. He continues to have macerated skin on the bottom of the left foot as well as right 5/26; left calf wound is better, left buttock wound appears to have multiple small superficial open areas with surrounding macerated skin. X-rays that I did last time showed no evidence of osteomyelitis in either foot. He is finished cefdinir and doxycycline. I do not think that he was on terbinafine. He continues to  have a large superficial open area on the right foot anterior dorsal and slightly between the first and second toes. I did send him to dermatology 2 months ago or so wondering about whether they would do a fungal scraping. I do not believe they did but did do a culture. We have been using silver alginate to the toe areas, he has been using antifungals at home topically either ketoconazole or Lotrisone. We are using silver collagen on the left foot, silver alginate on the right, silver collagen on the left lateral leg and silver alginate on the left buttock 6/1; left buttock area is healed. We have the left dorsal foot, left lateral leg and right dorsal foot. We are using silver alginate to the areas on both feet and silver collagen to the area on his left lateral calf 6/8; the left buttock apparently reopened late last week. He is not really sure how this happened. He is tolerating the terbinafine. Using silver alginate to all wounds 6/15; left buttock wound is larger than last week but still superficial. ooCame in the clinic today with a report of purulence from the left lateral leg I did not identify any infection ooBoth areas on his dorsal feet appear to be better. He is tolerating the terbinafine. Using silver alginate to all wounds 6/22; left buttock is about the same this week, left calf quite a bit better. His left foot is about the same however he comes in with erythema and warmth in the right forefoot once again. Culture that I gave him in the beginning of May showed Enterobacter and E. coli. I gave him doxycycline and things seem to improve although neither 1 of these organisms was specifically plated. 6/29; left buttock is larger and dry this week. Left lateral calf looks to me to be improved. Left dorsal foot also somewhat improved right foot completely  unchanged. The erythema on the right foot is still present. He is completing the Ceftin dinner that I gave him empirically [see  discussion above.) 7/6 - All wounds look to be stable and perhaps improved, the left buttock wound is slightly smaller, per patient bleeds easily, completed ceftin, the right foot redness is less, he is on terbinafine 7/13; left buttock wound about the same perhaps slightly narrower. Area on the left lateral leg continues to narrow. Left dorsal foot slightly smaller right foot about the same. We are using silver alginate on the right foot and Hydrofera Blue to the areas on the left. Unna boot on the left 2 layer compression on the right 7/20; left buttock wound absolutely the same. Area on lateral leg continues to get better. Left dorsal foot require debridement as did the right no major change in the 7/27; left buttock wound the same size necrotic debris over the surface. The area on the lateral leg is closed once again. His left foot looks better right foot about the same although there is some involvement now of the posterior first second toe area. He is still on terbinafine which I have given him for a month, not certain a centimeter major change 06/25/19-All wounds appear to be slightly improved according to report, left buttock wound looks clean, both foot wounds have minimal to no debris the right dorsal foot has minimal slough. We are using Hydrofera Blue to the left and silver alginate to the right foot and ischial wound. 8/10-Wounds all appear to be around the same, the right forefoot distal part has some redness which was not there before, however the wound looks clean and small. Ischial wound looks about the same with no changes 8/17; his wound on the left lateral calf which was his original chronic venous insufficiency wound remains closed. Since I last saw him the areas on the left dorsal foot right dorsal foot generally appear better but require debridement. The area on his left initial tuberosity appears somewhat larger to me perhaps hyper granulated and bleeds very easily. We have  been using Hydrofera Blue to the left dorsal foot and silver alginate to everything else 8/24; left lateral calf remains closed. The areas on his dorsal feet on the webspace of the first and second toes bilaterally both look better. The area on the left buttock which is the pressure ulcer stage II slightly smaller. I change the dressing to Hydrofera Blue to all areas 8/31; left lateral calf remains closed. The area on his dorsal feet bilaterally look better. Using Hydrofera Blue. Still requiring debridement on the left foot. No change in the left buttock pressure ulcers however 9/14; left lateral calf remains closed. Dorsal feet look quite a bit better than 2 weeks ago. Flaking dry skin also a lot better with the ammonium lactate I gave him 2 weeks ago. The area on the left buttock is improved. He states that his Roho cushion developed a leak and he is getting a new one, in the interim he is offloading this vigorously 9/21; left calf remains closed. Left heel which was a possible DTI looks better this week. He had macerated tissue around the left dorsal foot right foot looks satisfactory and improved left buttock wound. I changed his dressings to his feet to silver alginate bilaterally. Continuing Hydrofera Blue on the left buttock. 9/28 left calf remains closed. Left heel did not develop anything [possible DTI] dry flaking skin on the left dorsal foot. Right foot looks satisfactory. Improved left  buttock wound. We are using silver alginate on his feet Hydrofera Blue on the buttock. I have asked him to go back to the Lotrisone on his feet including the wounds and surrounding areas 10/5; left calf remains closed. The areas on the left and right feet about the same. A lot of this is epithelialized however debris over the remaining open areas. He is using Lotrisone and silver alginate. The area on the left buttock using Hydrofera Blue 10/26. Patient has been out for 3 weeks secondary to Covid  concerns. He tested negative but I think his wife tested positive. He comes in today with the left foot substantially worse, right foot about the same. Even more concerning he states that the area on his left buttock closed over but then reopened and is considerably deeper in one aspect than it was before [stage III wound] 11/2; left foot really about the same as last week. Quarter sized wound on the dorsal foot just proximal to the first second toes. Surrounding erythema with areas of denuded epithelium. This is not really much different looking. Did not look like cellulitis this time however. ooRight foot area about the same.. We have been using silver alginate alginate on his toes ooLeft buttock still substantial irritated skin around the wound which I think looks somewhat better. We have been using Hydrofera Blue here. 11/9; left foot larger than last week and a very necrotic surface. Right foot I think is about the same perhaps slightly smaller. Debris around the circumference also addressed. Unfortunately on the left buttock there is been a decline. Satellite lesions below the major wound distally and now a an additional one posteriorly we have been using Hydrofera Blue but I think this is a pressure issue 11/16; left foot ulcer dorsally again a very adherent necrotic surface. Right foot is about the same. Not much change in the pressure ulcer on his left buttock. 11/30; left foot ulcer dorsally basically the same as when I saw him 2 weeks ago. Very adherent fibrinous debris on the wound surface. Patient reports a lot of drainage as well. 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 We have been using Iodoflex, patient changed back to alginate because of the drainage. Area on his right dorsal foot really looks benign with a healthier surface certainly a lot better than on the left. Left buttock wounds all improved using Hydrofera Blue 12/7;  left dorsal foot again no improvement. Tightly adherent debris. PCR culture I did last week only showed likely skin contaminant. I have gone ahead and done a punch biopsy of this which is about the last thing in terms of investigations I can think to do. He has known venous insufficiency and venous hypertension and this could be the issue here. The area on the right foot is about the same left buttock slightly worse according to our intake nurse secondary to [facility] Blue sticking to the wound 12/14; biopsy of the left foot that I did last time showed changes that could be related to wound healing/chronic stasis dermatitis phenomenon no neoplasm. We have been using silver alginate to both feet. I change the one on the left today to Sorbact and silver alginate to his other 2 wounds 12/28; the patient arrives with the following problems; ooMajor issue is the dorsal left foot which continues to be a larger deeper wound area. Still with a completely nonviable surface ooParadoxically the area mirror image on the right on the right dorsal  foot appears to be getting better. ooHe had some loss of dry denuded skin from the lower part of his original wound on the left lateral calf. Some of this area looked a little vulnerable and for this reason we put him in wrap that on this side this week ooThe area on his left buttock is larger. He still has the erythematous circular area which I think is a combination of pressure, sweat. This does not look like cellulitis or fungal dermatitis 11/26/2019; -Dorsal left foot large open wound with depth. Still debris over the surface. Using Sorbact ooThe area on the dorsal right foot paradoxically has closed over [facility] has a reopening on the left ankle laterally at the base of his original wound that extended up into the calf. This appears clean. ooThe left buttock wound is smaller but with very adherent necrotic debris over the surface. We have been using silver  alginate here as well The patient had arterial studies done in 2017. He had biphasic waveforms at the dorsalis pedis and posterior tibial bilaterally. ABI in the left was 1.17. Digit waveforms were dampened. He has slight spasticity in the great toes I do not think a TBI would be possible 1/11; the patient comes in today with a sizable reopening between the first and second toes on the right. This is not exactly in the same location where we have been treating wounds previously. According to our intake nurse this was actually fairly deep but 0.6 cm. The area on the left dorsal foot looks about the same the surface is somewhat cleaner using Sorbact, his MRI is in 2 days. We have not managed yet to get arterial studies. The new reopening on the left lateral calf looks somewhat better using alginate. The left buttock wound is about the same using alginate Objective Constitutional Sitting or standing Blood Pressure is within target range for patient.. Pulse regular and within target range for patient.Marland Kitchen Respirations regular, non-labored and within target range.. Temperature is normal and within the target range for the patient.Marland Kitchen Appears in no distress. Vitals Time Taken: 7:52 AM, Height: 70 in, Weight: 216 lbs, BMI: 31, Temperature: 98 F, Pulse: 96 bpm, Respiratory Rate: 18 breaths/min, Blood Pressure: 110/64 mmHg. General Notes: Wound exam ooLeft dorsal foot deep punched out wound. Necrotic surface but I think this is improving using Sorbact. ooHe has a new wound today between the right first and second toes. This is not exactly the same spot as we have been dealing with recently it is in the webspace between the toes. Our intake nurse reported some depth although there is so much spasticity here I do not know how she could possibly meant measure this. ooLateral left calf I think improved from last time. Hopefully this will hold together ooLeft buttock very friable surface. I used silver  nitrate on this area however even this caused very significant bleed Integumentary (Hair, Skin) Wound #24 status is Open. Original cause of wound was Gradually Appeared. The wound is located on the Left,Dorsal Foot. The wound measures 3.4cm length x 2.4cm width x 0.5cm depth; 6.409cm^2 area and 3.204cm^3 volume. There is Fat Layer (Subcutaneous Tissue) Exposed exposed. There is no tunneling or undermining noted. There is a medium amount of serosanguineous drainage noted. The wound margin is distinct with the outline attached to the wound base. There is small (1-33%) red granulation within the wound bed. There is a large (67- 100%) amount of necrotic tissue within the wound bed including Adherent Slough. Wound #35 status  is Open. Original cause of wound was Gradually Appeared. The wound is located on the Left Ischium. The wound measures 3.7cm length x 1.7cm width x 0.2cm depth; 4.94cm^2 area and 0.988cm^3 volume. There is Fat Layer (Subcutaneous Tissue) Exposed exposed. There is no tunneling or undermining noted. There is a medium amount of serosanguineous drainage noted. The wound margin is flat and intact. There is large (67-100%) red, friable granulation within the wound bed. There is no necrotic tissue within the wound bed. Wound #37 status is Open. Original cause of wound was Gradually Appeared. The wound is located on the Left,Lateral Malleolus. The wound measures 3.5cm length x 0.5cm width x 0.1cm depth; 1.374cm^2 area and 0.137cm^3 volume. There is Fat Layer (Subcutaneous Tissue) Exposed exposed. There is no tunneling or undermining noted. There is a medium amount of serosanguineous drainage noted. The wound margin is distinct with the outline attached to the wound base. There is large (67-100%) pink granulation within the wound bed. There is a small (1-33%) amount of necrotic tissue within the wound bed including Adherent Slough. Wound #38 status is Open. Original cause of wound was  Gradually Appeared. The wound is located on the Right Toe - Web between 1st and 2nd. The wound measures 2.1cm length x 0.2cm width x 0.7cm depth; 0.33cm^2 area and 0.231cm^3 volume. There is Fat Layer (Subcutaneous Tissue) Exposed exposed. There is no tunneling or undermining noted. There is a medium amount of serosanguineous drainage noted. There is medium (34-66%) pink, pale granulation within the wound bed. There is a medium (34-66%) amount of necrotic tissue within the wound bed including Adherent Slough. 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 There was a Three Layer Compression Therapy Procedure by Levan Hurst, RN. 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 There was a Three Layer Compression Therapy Procedure by Levan Hurst, RN. Post procedure Diagnosis Wound #37: Same as Pre-Procedure Wound #35 Pre-procedure diagnosis of Wound #35 is a Pressure Ulcer located on the Left Ischium . An Chemical Cauterization procedure was performed by Ricard Dillon., MD. Post procedure Diagnosis Wound #35: Same as Pre-Procedure Notes: silver nitrate Plan Follow-up Appointments: Return Appointment in 1 week. Dressing Change Frequency: Wound #24 Left,Dorsal Foot: Do not change entire dressing for one week. Wound #35 Left Ischium: Change Dressing every other day. Wound #38 Right Toe - Web between 1st and 2nd: Change Dressing every other day. Wound #37 Left,Lateral  Malleolus: Do not change entire dressing for one week. Skin Barriers/Peri-Wound Care: Antifungal cream - on toes on both feet daily Moisturizing lotion - both legs Other: - Triamcinolone cream Wound #24 Left,Dorsal Foot: Barrier cream Moisturizing lotion - to both legs and feet Wound #35 Left Ischium: Barrier cream Primary Wound Dressing: Wound #24 Left,Dorsal Foot: Other: - Sorbact Wound #35 Left Ischium: Calcium Alginate - May switch back to silver alginate after MRI on Wednesday Wound #37 Left,Lateral Malleolus: Calcium Alginate Wound #38 Right Toe - Web between 1st and 2nd: Calcium Alginate Secondary Dressing: Wound #24 Left,Dorsal Foot: Dry Gauze Heel Cup -  add heel cup to left heel for protection. Wound #35 Left Ischium: Foam Border Wound #37 Left,Lateral Malleolus: Dry Gauze Wound #38 Right Toe - Web between 1st and 2nd: Kerlix/Rolled Gauze - secure with tape Dry Gauze Edema Control: 3 Layer Compression System - Left Lower Extremity Elevate legs to the level of the heart or above for 30 minutes daily and/or when sitting, a frequency of: - throughout the day Support Garment 30-40 mm/Hg pressure to: - Juxtalite to right leg Off-Loading: Low air-loss mattress (Group 2) Roho cushion for wheelchair Turn and reposition every 2 hours - out of wheelchair throughout the day, try to lay on sides, sleep in the bed not the recliner 1. Continue Sorbact of the left foot 2. Calcium alginate to the other wound areas. 3. Arterial studies have yet to be booked. His peripheral pulses are palpable but very faint on the left including the dorsalis pedis and posterior tib Electronic Signature(s) Signed: 12/03/2019 6:04:26 PM By: Linton Ham MD Signed: 12/04/2019 6:18:44 PM By: Levan Hurst RN, BSN Entered By: Levan Hurst on 12/03/2019 09:04:06 -------------------------------------------------------------------------------- Alanson Details Patient Name: Date of  Service: [name], [name] 12/03/2019 Medical Record [id number] Patient Account Number: [id number] Date of Birth/Sex: Treating RN: [birth year] (34 y.o. 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 Facility Procedures CPT4 Code Description: [id number] (Facility Use Only) [id number] - APPLY [facility] [id number] - CHEM CAUT GRANULATION TISS ICD-10 Diagnosis Description 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

## 2019-12-05 ENCOUNTER — Ambulatory Visit (HOSPITAL_COMMUNITY): Payer: BC Managed Care – PPO

## 2019-12-05 ENCOUNTER — Other Ambulatory Visit: Payer: Self-pay

## 2019-12-05 ENCOUNTER — Ambulatory Visit (HOSPITAL_COMMUNITY)
Admission: RE | Admit: 2019-12-05 | Discharge: 2019-12-05 | Disposition: A | Discharge status: Home / Self Care | Payer: BC Managed Care – PPO | Source: Ambulatory Visit | Attending: Vascular Surgery | Admitting: Vascular Surgery

## 2019-12-05 DIAGNOSIS — S81802A Unspecified open wound, left lower leg, initial encounter: Secondary | ICD-10-CM | POA: Insufficient documentation

## 2019-12-10 ENCOUNTER — Other Ambulatory Visit: Payer: Self-pay

## 2019-12-10 ENCOUNTER — Encounter (HOSPITAL_BASED_OUTPATIENT_CLINIC_OR_DEPARTMENT_OTHER): Payer: BC Managed Care – PPO | Attending: Internal Medicine | Admitting: Internal Medicine

## 2019-12-10 DIAGNOSIS — G8221 Paraplegia, complete: Secondary | ICD-10-CM | POA: Diagnosis not present

## 2019-12-10 DIAGNOSIS — L97322 Non-pressure chronic ulcer of left 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: Secondary | ICD-10-CM | POA: Diagnosis not present

## 2019-12-10 DIAGNOSIS — Z882 Allergy status to sulfonamides status: Secondary | ICD-10-CM | POA: Diagnosis not present

## 2019-12-10 DIAGNOSIS — Z880 Allergy status to penicillin: Secondary | ICD-10-CM | POA: Diagnosis not present

## 2019-12-10 DIAGNOSIS — L97512 Non-pressure chronic ulcer of other part of right foot with fat layer exposed: Secondary | ICD-10-CM | POA: Diagnosis not present

## 2019-12-10 DIAGNOSIS — L98492 Non-pressure chronic ulcer of skin of other sites with fat layer exposed: Secondary | ICD-10-CM | POA: Diagnosis not present

## 2019-12-10 DIAGNOSIS — L97522 Non-pressure chronic ulcer of other part of left foot with fat layer exposed: Secondary | ICD-10-CM | POA: Diagnosis not present

## 2019-12-10 DIAGNOSIS — L03116 Cellulitis of left lower limb: Secondary | ICD-10-CM | POA: Diagnosis not present

## 2019-12-10 DIAGNOSIS — Z881 Allergy status to other antibiotic agents status: Secondary | ICD-10-CM | POA: Diagnosis not present

## 2019-12-10 DIAGNOSIS — Z888 Allergy status to other drugs, medicaments and biological substances status: Secondary | ICD-10-CM | POA: Diagnosis not present

## 2019-12-10 DIAGNOSIS — I872 Venous insufficiency (chronic) (peripheral): Secondary | ICD-10-CM | POA: Diagnosis not present

## 2019-12-10 DIAGNOSIS — Z8614 Personal history of Methicillin resistant Staphylococcus aureus infection: Secondary | ICD-10-CM | POA: Diagnosis not present

## 2019-12-10 DIAGNOSIS — Z87891 Personal history of nicotine dependence: Secondary | ICD-10-CM | POA: Diagnosis not present

## 2019-12-11 NOTE — Progress Notes (Addendum)
[name], [name] GROSSE ([id number]) Visit Report for 12/10/2019 Arrival Information Details Patient Name: Date of Service: [name], [name] 12/10/2019 7:30 AM Medical Record [id number] Patient Account Number: [id number] Date of Birth/Sex: Treating RN: 30-Apr-1986 (34 y.o. [name] Primary Care Angelyne Terwilliger: [place], [place] Other Clinician: Referring Demetrion Wesby: Treating Biagio Snelson/Extender:Robson, [name], [name]: 205 Visit Information History Since Last Visit Added or deleted any medications: No Patient Arrived: Wheel Chair Any new allergies or adverse reactions: No Arrival Time: 07:45 Had a fall or experienced change in No activities of daily living that may affect Accompanied By: self risk of falls: Transfer Assistance: None Signs or symptoms of abuse/neglect since last No Patient Identification Verified: Yes visito Secondary Verification Process Completed: Yes Hospitalized since last visit: No Patient Requires Transmission-Based No Implantable device outside of the clinic excluding No Precautions: cellular tissue based products placed in the center Patient Has Alerts: Yes since last visit: Patient Alerts: R ABI = Has Dressing in Place as Prescribed: Yes 1.0 Has Compression in Place as Prescribed: Yes L ABI = Pain Present Now: No 1.1 Electronic Signature(s) Signed: 12/10/2019 5:45:39 PM By: [name] Entered By: [name] on 12/10/2019 07:50:50 -------------------------------------------------------------------------------- Compression Therapy Details Patient Name: Date of Service: [name], [name] 12/10/2019 7:30 AM Medical Record [id number] Patient Account Number: [id number] Date of Birth/Sex: Treating RN: Nov 26, 1985 (34 y.o. [name] Primary Care Jesstin Studstill: [place], [place] Other Clinician: Referring Stephan Nelis: Treating Birttany Dechellis/Extender:Robson, [name], [name]: 205 Compression Therapy Performed for  Wound Wound #37 Left,Lateral Malleolus Assessment: Performed By: Clinician [name] Hurst, RN Compression Type: Three Layer Post Procedure Diagnosis Same as Pre-procedure Electronic Signature(s) Signed: 12/10/2019 6:02:32 PM By: [name] Hurst RN, BSN Entered By: [name] on 12/10/2019 08:10:09 -------------------------------------------------------------------------------- Compression Therapy Details Patient Name: Date of Service: [name], [name] 12/10/2019 7:30 AM Medical Record [id number] Patient Account Number: [id number] Date of Birth/Sex: Treating RN: [birth year] (34 y.o. [name] Primary Care Shaunn Tackitt: [place], [place] Other Clinician: Referring Mohd Clemons: Treating Daviana Haymaker/Extender:Robson, [name], [name]: 205 Compression Therapy Performed for Wound Wound #24 Left,Dorsal Foot Assessment: Performed By: Clinician [name] Hurst, RN Compression Type: Three Layer Post Procedure Diagnosis Same as Pre-procedure Electronic Signature(s) Signed: 12/10/2019 6:02:32 PM By: [name] Hurst RN, BSN Entered By: [name] on 12/10/2019 08:10:09 -------------------------------------------------------------------------------- Encounter Discharge Information Details Patient Name: Date of Service: [name], [name] 12/10/2019 7:30 AM Medical Record [id number] Patient Account Number: [id number] Date of Birth/Sex: Treating RN: [birth year] (35 y.o. [name] Primary Care Donta Fuster: [place], [place] Other Clinician: Referring Delmar Dondero: Treating Jaylena Holloway/Extender:Robson, [name], [name]: 205 Encounter Discharge Information Items Post Procedure Vitals Discharge Condition: Stable Temperature (F): 98.4 Ambulatory Status: Wheelchair Pulse (bpm): 109 Discharge Destination: Home Respiratory Rate (breaths/min): 18 Transportation: Other Blood Pressure (mmHg): 129/71 Accompanied By: self Schedule Follow-up  Appointment: Yes Clinical Summary of Care: Patient Declined Electronic Signature(s) Signed: 12/10/2019 5:45:55 PM By: [name] Entered By: [name] on 12/10/2019 08:44:49 -------------------------------------------------------------------------------- Lower Extremity Assessment Details Patient Name: Date of Service: [name], [name] 12/10/2019 7:30 AM Medical Record [id number] Patient Account Number: [id number] Date of Birth/Sex: Treating RN: 26-Mar-1986 (34 y.o. [name] Primary Care Mayerli Kirst: [place], [place] Other Clinician: Referring Benedicta Sultan: Treating Alonso Gapinski/Extender:Robson, [name], [name]: 205 Edema Assessment Assessed: [Left: No] [Right: Yes] Edema: [Left: Yes] [Right: Yes] Calf Left: Right: Point of Measurement: 33 cm From Medial Instep 26 cm 33 cm Ankle Left: Right: Point of Measurement: 10 cm From Medial Instep 23 cm  24.5 cm Electronic Signature(s) Signed: 12/10/2019 5:45:39 PM By: [name] Entered By: [name] on 12/10/2019 07:55:57 -------------------------------------------------------------------------------- Multi Wound Chart Details Patient Name: Date of Service: [name], [name] 12/10/2019 7:30 AM Medical Record [id number] Patient Account Number: [id number] Date of Birth/Sex: Treating RN: 1986-07-04 (34 y.o. M) Primary Care Veneda Kirksey: [name], [name] Other Clinician: Referring Rodolph Hagemann: Treating Mayreli Alden/Extender:Robson, [name], [name]: 205 Vital Signs Height(in): 70 Pulse(bpm): 109 Weight(lbs): 216 Blood Pressure(mmHg): 129/71 Body Mass Index(BMI): 31 Temperature(F): 98.4 Respiratory 18 Rate(breaths/min): Photos: [24:No Photos] [35:No Photos] [37:No Photos] Wound Location: [24:Left Foot - Dorsal] [35:Left Ischium] [37:Left Malleolus - Lateral] Wounding Event: [24:Gradually Appeared] [35:Gradually Appeared] [37:Gradually Appeared] Primary Etiology:  [24:Inflammatory] [35:Pressure Ulcer] [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] Comorbid History: [24:Sleep Apnea, Hypertension, Sleep Apnea, Hypertension, Sleep Apnea, Hypertension, Paraplegia] [35:Paraplegia] [37:Paraplegia] Date Acquired: [24:03/08/2018] [35:04/28/2019] [37:11/26/2019] Weeks of Treatment: [24:91] [35:32] [37:2] Wound Status: [24:Open] [35:Open] [37:Open] Clustered Wound: [24:Yes] [35:Yes] [37:No] Clustered Quantity: [24:1] [35:2] [37:N/A] Measurements L x [name] x D 3.2x2.4x0.3 [35:3x2.7x0.2] [37:2x0.9x0.1] (cm) Area (cm) : [24:6.032] [35:6.362] [37:1.414] Volume (cm) : [24:1.81] [35:1.272] [37:0.141] % Reduction in Area: [24:-2455.90%] [35:55.00%] [37:64.30%] % Reduction in Volume: -7441.70% [35:10.00%] [37:64.40%] Classification: [24:Full Thickness Without Exposed Support Structures] [35:Category/Stage III] [37:Full Thickness Without Exposed Support Structures] Exudate Amount: [24:Medium] [35:Medium] [37:Medium] Exudate Type: [24:Serosanguineous] [35:Serosanguineous] [37:Serosanguineous] Exudate Color: [24:red, brown] [35:red, brown] [37:red, brown] Wound Margin: [24:Distinct, outline attached Flat and Intact] [37:Distinct, outline attached] Granulation Amount: [24:Small (1-33%)] [35:Large (67-100%)] [37:Large (67-100%)] Granulation Quality: [24:Red] [35:Red, Friable] [37:Pink] Necrotic Amount: [24:Large (67-100%)] [35:None Present (0%)] [37:Small (1-33%)] Exposed Structures: [24:Fat Layer (Subcutaneous Fat Layer (Subcutaneous Fat Layer (Subcutaneous Tissue) Exposed: Yes Fascia: No Tendon: No Muscle: No Joint: No Bone: No] [35:Tissue) Exposed: Yes Fascia: No Tendon: No Muscle: No Joint: No Bone: No] [37:Tissue) Exposed: Yes  Fascia: No Tendon: No Muscle: No Joint: No Bone: No] Epithelialization: [24:Small (1-33%)] [35:Small (1-33%)] [37:Small (1-33%)] Debridement: [24:Debridement - Excisional N/A] [37:N/A] Pre-procedure [24:08:07] [35:N/A] [37:N/A] Verification/Time Out Taken: Tissue Debrided:  [24:Subcutaneous, Slough] [35:N/A] [37:N/A] Level: [24:Skin/Subcutaneous Tissue N/A] [37:N/A] Debridement Area (sq cm):7.68 [35:N/A] [37:N/A] Instrument: [24:Curette] [35:N/A] [37:N/A] Bleeding: [24:Minimum] [35:N/A] [37:N/A] Hemostasis Achieved: [24:Pressure] [35:N/A] [37:N/A] Procedural Pain: [24:0] [35:N/A] [37:N/A] Post Procedural Pain: [24:0] [35:N/A] [37:N/A] Debridement Treatment Procedure was tolerated [35:N/A] [37:N/A] Response: [24:well] Post Debridement [24:3.2x2.4x0.3] [35:N/A] [37:N/A] Measurements L x [name] x D (cm) Post Debridement [24:1.81] [35:N/A] [37:N/A] Volume: (cm) Procedures Performed: Compression Therapy [24:Debridement 38] [35:N/A N/A] [37:Compression Therapy N/A] Photos: [24:No Photos] [35:N/A] [37:N/A] Wound Location: [24:Right Toe - Web between N/A 1st and 2nd] [37:N/A] Wounding Event: [24:Gradually Appeared] [35:N/A] [37:N/A] Primary Etiology: [24:Inflammatory] [35:N/A] [37:N/A] Comorbid History: [24:Sleep Apnea, Hypertension, N/A Paraplegia] [37:N/A] Date Acquired: [24:11/30/2019] [35:N/A] [37:N/A] Weeks of Treatment: [24:1] [35:N/A] [37:N/A] Wound Status: [24:Open] [35:N/A] [37:N/A] Clustered Wound: [24:No] [35:N/A] [37:N/A] Clustered Quantity: [24:N/A] [35:N/A] [37:N/A] Measurements L x [name] x D 3x0.8x0.1 [35:N/A] [37:N/A] (cm) Area (cm) : [24:1.885] [35:N/A] [37:N/A] Volume (cm) : [24:0.188] [35:N/A] [37:N/A] % Reduction in Area: [24:-471.20%] [35:N/A] [37:N/A] % Reduction in Volume: 18.60% [35:N/A] [37:N/A] Classification: [24:Full Thickness Without Exposed Support Structures] [35:N/A] [37:N/A] Exudate Amount: [24:Medium] [35:N/A] [37:N/A] Exudate Type: [24:Serosanguineous] [35:N/A] [37:N/A] Exudate Color: [24:red, brown] [35:N/A] [37:N/A] Wound Margin: [24:Distinct, outline attached N/A] [37:N/A] Granulation Amount: [24:Medium (34-66%)] [35:N/A] [37:N/A] Granulation Quality: [24:Pink, Pale] [35:N/A] [37:N/A] Necrotic Amount: [24:Medium (34-66%)]  [35:N/A] [37:N/A] Exposed Structures: [24:Fat Layer (Subcutaneous N/A Tissue) Exposed: Yes Fascia: No Tendon: No Muscle: No Joint: No Bone: No] [37:N/A] Epithelialization: [24:None] [35:N/A] [37:N/A] Debridement: [24:N/A] [35:N/A] [37:N/A] Tissue Debrided: [24:N/A] [35:N/A] [  37:N/A] Level: [24:N/A] [35:N/A] [37:N/A] Debridement Area (sq cm):N/A [35:N/A] [37:N/A] Instrument: [24:N/A] [35:N/A] [37:N/A] Bleeding: [24:N/A] [35:N/A] [37:N/A] Hemostasis Achieved: [24:N/A] [35:N/A] [37:N/A] Procedural Pain: [24:N/A] [35:N/A] [37:N/A] Post Procedural Pain: [24:N/A] [35:N/A] [37:N/A] Debridement Treatment N/A [35:N/A] [37:N/A] Response: Post Debridement [24:N/A] [35:N/A] [37:N/A] Measurements L x [name] x D (cm) Post Debridement [24:N/A] [35:N/A] [37:N/A] Volume: (cm) Procedures Performed: N/A [35:N/A] [37:N/A] Treatment Notes Electronic Signature(s) Signed: 12/11/2019 4:08:35 PM By: Linton Ham MD Entered By: Linton Ham on 12/10/2019 08:16:11 -------------------------------------------------------------------------------- Multi-Disciplinary Care Plan Details Patient Name: Date of Service: [name] 12/10/2019 7:30 AM Medical Record [id number] Patient Account Number: [id number] Date of Birth/Sex: Treating RN: 1986-03-20 (34 y.o. [name] Primary Care Demarri Elie: [name], [name] Other Clinician: Referring Benjamim Harnish: Treating Jamerson Vonbargen/Extender:Robson, [name], [name]: 205 Active Inactive Wound/Skin Impairment Nursing Diagnoses: Impaired tissue integrity Knowledge deficit related to ulceration/compromised skin integrity Goals: Patient/caregiver will verbalize understanding of skin care regimen Date Initiated: 01/05/2016 Target Resolution Date: 12/21/2019 Goal Status: Active Ulcer/skin breakdown will have a volume reduction of 30% by week 4 Date Initiated: 01/05/2016 Date Inactivated: 12/22/2017 Target Resolution Date: 01/19/2018 Unmet  Reason: complex Goal Status: Unmet wounds, infection Interventions: Assess patient/caregiver ability to obtain necessary supplies Assess ulceration(s) every visit Provide education on ulcer and skin care Notes: Electronic Signature(s) Signed: 12/10/2019 6:02:32 PM By: [name] Hurst RN, BSN Entered By: [name] on 12/10/2019 07:49:45 -------------------------------------------------------------------------------- Pain Assessment Details Patient Name: Date of Service: [place]strom, [name] 12/10/2019 7:30 AM Medical Record [id number] Patient Account Number: [id number] Date of Birth/Sex: Treating RN: 09-28-1986 (34 y.o. [name] Primary Care Ellina Sivertsen: [place], [place] Other Clinician: Referring Jeptha Hinnenkamp: Treating Christoph Copelan/Extender:Robson, [name], [name]: 205 Active Problems Location of Pain Severity and Description of Pain Patient Has Paino No Site Locations Rate the pain. Current Pain Level: 0 Pain Management and Medication Current Pain Management: Medication: No Cold Application: No Rest: No Massage: No Activity: No T.FerrellN.S.: No Heat Application: No Leg drop or elevation: No Is the Current Pain Management Adequate: Adequate How does your wound impact your activities of daily livingo Sleep: No Bathing: No Appetite: No Relationship With Others: No Bladder Continence: No Emotions: No Bowel Continence: No Work: No Toileting: No Drive: No Dressing: No Hobbies: No Electronic Signature(s) Signed: 12/10/2019 5:45:39 PM By: [name] Entered By: [name] on 12/10/2019 07:51:47 -------------------------------------------------------------------------------- Patient/Caregiver Education Details Patient Name: Date of Service: [name] 1/18/2021andnbsp7:30 AM Medical Record [id number] Patient Account Number: [id number] Date of Birth/Gender: 27-May-1986 (33 y.o. M) Treating RN: [name] Primary Care Physician:  Janine Limbo Other Clinician: Referring Physician: Treating Physician/Extender:Robson, Pecola Leisure in Treatment: 23 Education Assessment Education Provided To: Patient Education Topics Provided Wound/Skin Impairment: Methods: Explain/Verbal Responses: State content correctly Electronic Signature(s) Signed: 12/10/2019 6:02:32 PM By: [name] Hurst RN, BSN Entered By: [name] on 12/10/2019 07:50:04 -------------------------------------------------------------------------------- Wound Assessment Details Patient Name: Date of Service: [name], [name] 12/10/2019 7:30 AM Medical Record [id number] Patient Account Number: [id number] Date of Birth/Sex: Treating RN: Apr 02, 1986 (34 y.o. [name] Primary Care Slayter Moorhouse: [place], [place] Other Clinician: Referring Alaa Mullally: Treating Derryl Uher/Extender:Robson, [name], [name]: 205 Wound Status Wound Number: 24 Primary Etiology: Inflammatory Wound Location: Left Foot - Dorsal Wound Status: Open Wounding Event: Gradually Appeared Comorbid Sleep Apnea, Hypertension, History: Paraplegia Date Acquired: 03/08/2018 Weeks Of Treatment: 91 Clustered Wound: Yes Photos Wound Measurements Length: (cm) 3.2 % Reduct Width: (cm) 2.4 % Reduct Depth: (cm) 0.3 Epitheli Clustered Quantity: 1 Tunnelin Area: (cm)  6.032 Undermi Volume: (cm) 1.81 Wound Description Classification: Full Thickness Without Exposed Support Foul Od Structures Slough/ Wound Wound Distinct, outline attached Margin: Exudate Medium Amount: Exudate Serosanguineous Type: Exudate red, brown Color: Wound Bed Granulation Amount: Small (1-33%) Granulation Quality: Red Fa Necrotic Amount: Large (67-100%) Fa Necrotic Quality: Adherent Slough Te [place] Levander Campion or After Cleansing: No Fibrino Yes Exposed Structure scia Exposed: No t Layer (Subcutaneous Tissue) Exposed: Yes ndon Exposed: No scle Exposed: No int  Exposed: No ne Exposed: No ion in Area: -2455.9% ion in Volume: -7441.7% alization: Small (1-33%) g: No ning: No Treatment Notes Wound #24 (Left, Dorsal Foot) 1. Cleanse With Wound Cleanser Soap and water 2. Periwound Care TCA Ointment 3. Primary Dressing Applied Calcium Alginate Ag Other primary dressing (specifiy in notes) 4. Secondary Dressing Dry Gauze 6. Support Layer Applied 3 layer compression wrap Notes sorbact to dorsal foot and and silver alginate to lateral malleolar Electronic Signature(s) Signed: 12/14/2019 5:14:05 PM By: Mikeal Hawthorne EMT/HBOT Signed: 12/14/2019 5:44:09 PM By: [name] Previous Signature: 12/10/2019 5:45:39 PM Version By: [name] Entered By: Mikeal Hawthorne on 12/14/2019 12:47:52 -------------------------------------------------------------------------------- Wound Assessment Details Patient Name: Date of Service: [name], [name] 12/10/2019 7:30 AM Medical Record [id number] Patient Account Number: [id number] Date of Birth/Sex: Treating RN: 03-09-1986 (34 y.o. [name] Primary Care Sumedha Munnerlyn: [place], [place] Other Clinician: Referring Bridger Pizzi: Treating Marlynn Hinckley/Extender:Robson, [name], [name]: 205 Wound Status Wound Number: 35 Primary Etiology: Pressure Ulcer Wound Location: Left Ischium Wound Status: Open Wounding Event: Gradually Appeared Comorbid Sleep Apnea, Hypertension, History: Paraplegia Date Acquired: 04/28/2019 Weeks Of Treatment: 32 Clustered Wound: Yes Photos Wound Measurements Length: (cm) 3 Width: (cm) 2.7 Depth: (cm) 0.2 Clustered Quantity: 2 Area: (cm) 6.362 Volume: (cm) 1.272 Wound Description Classification: Category/Stage III Wound Margin: Flat and Intact Exudate Amount: Medium Exudate Type: Serosanguineous Exudate Color: red, brown Wound Bed Granulation Amount: Large (67-100%) Granulation Quality: Red, Friable Necrotic Amount: None Present (0%) ter  Cleansing: No no No Exposed Structure ed: No ubcutaneous Tissue) Exposed: Yes ed: No ed: No d: No : No % Reduction in Area: 55% % Reduction in Volume: 10% Epithelialization: Small (1-33%) Tunneling: No Undermining: No Foul Odor Af Slough/Fibri Fascia Expos Fat Layer (S Tendon Expos Muscle Expos Joint Expose Bone Exposed Treatment Notes Wound #35 (Left Ischium) 1. Cleanse With Wound Cleanser 2. Periwound Care Skin Prep 3. Primary Dressing Applied Calcium Alginate Ag 4. Secondary Dressing ABD Pad Dry Gauze 5. Secured With Recruitment consultant) Signed: 12/14/2019 5:14:05 PM By: Mikeal Hawthorne EMT/HBOT Signed: 12/14/2019 5:44:09 PM By: [name] Previous Signature: 12/10/2019 5:45:39 PM Version By: [name] Entered By: Mikeal Hawthorne on 12/14/2019 11:37:30 -------------------------------------------------------------------------------- Wound Assessment Details Patient Name: Date of Service: [name], [name] 12/10/2019 7:30 AM Medical Record [id number] Patient Account Number: [id number] Date of Birth/Sex: Treating RN: 1986/03/12 (34 y.o. [name] Primary Care Saveah Bahar: [place], [place] Other Clinician: Referring Sairah Knobloch: Treating Cassiel Fernandez/Extender:Robson, [name], [name]: 205 Wound Status Wound Number: 37 Primary Etiology: Venous Leg Ulcer Wound Location: Left Malleolus - Lateral Wound Status: Open Wounding Event: Gradually Appeared Comorbid Sleep Apnea, Hypertension, History: Paraplegia Date Acquired: 11/26/2019 Weeks Of Treatment: 2 Clustered Wound: No Photos Wound Measurements Length: (cm) 2 % Reduct Width: (cm) 0.9 % Reduct Depth: (cm) 0.1 Epitheli Area: (cm) 1.414 Tunneli Volume: (cm) 0.141 Undermi Wound Description Classification: Full Thickness Without Exposed Support Foul Odo Structures Slough/F Wound Distinct, outline attached Margin: Exudate Medium Amount: Exudate  Serosanguineous Type: Exudate red, brown  Color: Wound Bed Granulation Amount: Large (67-100%) Granulation Quality: Pink Fascia E Necrotic Amount: Small (1-33%) Fa Necrotic Quality: Adherent Slough Te Mu Levander Campion r After Cleansing: No ibrino Yes Exposed Structure xposed: No t Layer (Subcutaneous Tissue) Exposed: Yes ndon Exposed: No scle Exposed: No int Exposed: No ne Exposed: No ion in Area: 64.3% ion in Volume: 64.4% alization: Small (1-33%) ng: No ning: No Treatment Notes Wound #37 (Left, Lateral Malleolus) 1. Cleanse With Wound Cleanser Soap and water 2. Periwound Care TCA Ointment 3. Primary Dressing Applied Calcium Alginate Ag Other primary dressing (specifiy in notes) 4. Secondary Dressing Dry Gauze 6. Support Layer Applied 3 layer compression wrap Notes sorbact to dorsal foot and and silver alginate to lateral malleolar Electronic Signature(s) Signed: 12/14/2019 5:14:05 PM By: Mikeal Hawthorne EMT/HBOT Signed: 12/14/2019 5:44:09 PM By: [name] Previous Signature: 12/10/2019 5:45:39 PM Version By: [name] Entered By: Mikeal Hawthorne on 12/14/2019 15:26:05 -------------------------------------------------------------------------------- Wound Assessment Details Patient Name: Date of Service: [name], [name] 12/10/2019 7:30 AM Medical Record [id number] Patient Account Number: [id number] Date of Birth/Sex: Treating RN: August 07, 1986 (34 y.o. [name] Primary Care Mitchel Delduca: [place], [place] Other Clinician: Referring Rajendra Spiller: Treating Kinzlee Selvy/Extender:Robson, [name], [name]: 205 Wound Status Wound Number: 38 Primary Etiology: Inflammatory Wound Location: Right Toe - Web between 1st and Wound Status: Open 2nd Comorbid Sleep Apnea, Hypertension, Wounding Event: Gradually Appeared History: Paraplegia Date Acquired: 11/30/2019 Weeks Of Treatment: 1 Clustered Wound: No Photos Wound Measurements Length: (cm) 3 %  Reduct Width: (cm) 0.8 % Reduct Depth: (cm) 0.1 Epitheli Area: (cm) 1.885 Tunneli Volume: (cm) 0.188 Undermi Wound Description Classification: Full Thickness Without Exposed Support Foul Odo Structures Slough/F Wound Distinct, outline attached Margin: Exudate Medium Amount: Exudate Serosanguineous Type: Exudate red, brown Color: Wound Bed Granulation Amount: Medium (34-66%) Granulation Quality: Pink, Pale Fascia E Necrotic Amount: Medium (34-66%) Fat Laye Necrotic Quality: Adherent Slough Tendon E Muscle E Joint Ex Bone Exp r After Cleansing: No ibrino Yes Exposed Structure xposed: No r (Subcutaneous Tissue) Exposed: Yes xposed: No xposed: No posed: No osed: No ion in Area: -471.2% ion in Volume: 18.6% alization: None ng: No ning: No Treatment Notes Wound #38 (Right Toe - Web between 1st and 2nd) 1. Cleanse With Wound Cleanser 3. Primary Dressing Applied Calcium Alginate Ag 4. Secondary Dressing Dry Gauze 5. Secured With Tape Notes Development worker, international aid) Signed: 12/14/2019 5:14:05 PM By: Mikeal Hawthorne EMT/HBOT Signed: 12/14/2019 5:44:09 PM By: [name] Previous Signature: 12/10/2019 5:45:39 PM Version By: [name] Entered By: Mikeal Hawthorne on 12/14/2019 11:34:45 -------------------------------------------------------------------------------- Vitals Details Patient Name: Date of Service: [name], Chimaobi E. 12/10/2019 7:30 AM Medical Record [id number] Patient Account Number: [id number] Date of Birth/Sex: Treating RN: September 03, 1986 (34 y.o. [name] Primary Care [name]: [place], [place] Other Clinician: Referring Arlisha Patalano: Treating Baylei Siebels/Extender:Robson, [name], [name]: 205 Vital Signs Time Taken: 07:50 Temperature (F): 98.4 Height (in): 70 Pulse (bpm): 109 Weight (lbs): 216 Respiratory Rate (breaths/min): 18 Body Mass Index (BMI): 31 Blood Pressure (mmHg): 129/71 Reference Range: 80 -  120 mg / dl Electronic Signature(s) Signed: 12/10/2019 5:45:39 PM By: [name] Entered By: [name] on 12/10/2019 07:51:18

## 2019-12-11 NOTE — Progress Notes (Signed)
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 hydrogel to keep the area moist. This is been refractory to multiple dressing attempts. He also has areas in his first and second toes bilaterally in the anterior and posterior web space. I had been using Iodoflex here after a prolonged course of silver alginate with ketoconazole was ineffective [question tinea pedis] 07/14/17; patient arrives today with a very difficult adherent material over his left lateral lower leg wound. He also has surrounding erythema and poorly controlled edema. He was switched his Santyl last visit which the nurses are applying once during his doctor visit and once on a nurse visit. He was also reduced to 2 layer compression I'm not exactly sure of the issue here. 07/21/17; better surface today after 1 week of Iodoflex. Significant cellulitis that we treated last week also better. [Doxycycline] 07/28/17 better surface today with now 2 [name] of Iodoflex. Significant cellulitis treated with doxycycline. He has now completed the doxycycline and he is back to his usual degree of chronic venous inflammation/stasis dermatitis. He reminds  me he has had ablations surgery here 08/04/17; continued improvement with Iodoflex to the left lateral leg wound in terms of the  surface of the wound although the dimensions are better. 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 Problematic areas on the plantar aspect of the first second toe web space on the left and the dorsal aspect of the first second toe web space on the right. At one point I felt these were probably related to chronic fungal infections in treated him aggressively for this although we have not made any improvement here. 08/11/17; left lateral leg. Surface continues to improve with the Iodoflex although we are not seeing much improvement in overall wound dimensions. Areas on his plantar left foot and right foot show no improvement. In fact the right foot looks somewhat worse 08/18/17; left lateral leg. We changed to [facility] Blue last week after a prolonged course of Iodoflex which helps get the surface better. It appears that the wound with is improved. Continue with difficult areas on the left dorsal first second and plantar first second on the right 09/01/17; patient arrives in clinic today having had a temperature of 103 yesterday. He was seen in the ER and [facility]. The patient was concerned he could have cellulitis again in the right leg however they diagnosed him with a UTI and he is now on Keflex. He has a history of cellulitis which is been recurrent and difficult but this is been in the left leg, in the past 5 use doxycycline. He does in and out catheterizations at home which are risk factors for UTI 09/08/17; patient will be completing his Keflex this weekend. The erythema on the left leg is considerably better. He has a new wound today on the medial part of the right leg small superficial almost looks like a skin tear. He has worsening of the area on the right dorsal first and second toe. His major area on the left lateral leg is better. Using Hydrofera Blue on all areas 09/15/17; gradual reduction in width on the long  wound in the left lateral leg. No debridement required. He also has wounds on the plantar aspect of his left first second toe web space and on the dorsal aspect of the right first second toe web space. 09/22/17; there continues to be very gradual improvements in the dimensions of the left lateral leg wound. He hasn't round erythematous spot with might be pressure on his wheelchair. There is no evidence obviously of infection no purulence no warmth He has a dry scaled area on the plantar aspect of the left first second toe Improved area on the dorsal right first second toe. 09/29/17; left lateral leg wound continues to improve in dimensions mostly with an is still a fairly long but increasingly narrow wound. He has a dry scaled area on the plantar aspect of his left first second toe web space Increasingly concerning area on the dorsal right first second toe. In fact I am concerned today about possible cellulitis around this wound. The areas extending up his second toe and although there is deformities here almost appears to abut on the nailbed. 10/06/17; left lateral leg wound continues to make very gradual progress. Tissue culture I did from the right first second toe dorsal foot last time grew MRSA and enterococcus which was vancomycin sensitive. This was not sensitive to clindamycin or doxycycline. He is allergic to Zyvox and sulfa we have therefore arrange for  him to have dalvance infusion tomorrow. He is had this in the past and tolerated it well 10/20/17; left lateral leg wound continues to make decent progress. This is certainly reduced in terms of with there is advancing epithelialization.The cellulitis in the right foot looks better although he still has a deep wound in the dorsal aspect of the first second toe web space. Plantar left first toe web space on the left I think is making some progress 10/27/17; left lateral leg wound continues to make decent progress. Advancing  epithelialization.using Hydrofera Blue The right first second toe web space wound is better-looking using silver alginate Improvement in the left plantar first second toe web space. Again using silver alginate 11/03/17 left lateral leg wound continues to make decent progress albeit slowly. Using [facility] The right per second toe web space continues to be a very problematic looking punched out wound. I obtained a piece of tissue for deep culture I did extensively treated this for fungus. It is difficult to imagine that this is a pressure area as the patient states other than going outside he doesn't really wear shoes at home The left plantar first second toe web space looked fairly senescent. Necrotic edges. This required debridement change to [facility] Blue to all wound areas 11/10/17; left lateral leg wound continues to contract. Using Hydrofera Blue On the right dorsal first second toe web space dorsally. Culture I did of this area last week grew MRSA there is not an easy oral option in this patient was multiple antibiotic allergies or intolerances. This was only a rare culture isolate I'm therefore going to use Bactroban under silver alginate On the left plantar first second toe web space. Debridement is required here. This is also unchanged 11/17/17; left lateral leg wound continues to contract using Hydrofera Blue this is no longer the major issue. The major concern here is the right first second toe web space. He now has an open area going from dorsally to the plantar aspect. There is now wound on the inner lateral part of the first toe. Not a very viable surface on this. There is erythema spreading medially into the forefoot. No major change in the left first second toe plantar wound 11/24/17; left lateral leg wound continues to contract using Hydrofera Blue. Nice improvement today The right first second toe web space all of this looks a lot less angry than last week. I have given him  clindamycin and topical Bactroban for MRSA and terbinafine for the possibility of underlining tinea pedis that I could not control with ketoconazole. Looks somewhat better The area on the plantar left first second toe web space is weeping with dried debris around the wound 12/01/17; left lateral leg wound continues to contract he Hydrofera Blue. It is becoming thinner in terms of with nevertheless it is making good improvement. The right first second toe web space looks less angry but still a large necrotic-looking wounds starting on the plantar aspect of the right foot extending between the toes and now extensively on the base of the right second toe. I gave him clindamycin and topical Bactroban for MRSA anterior benefiting for the possibility of underlying tinea pedis. Not looking better today The area on the left first/second toe looks better. Debrided of necrotic debris 12/05/17* the patient was worked in urgently today because over the weekend he found blood on his incontinence bad when he woke up. He was found to have an ulcer by his wife who does most of his  wound care. He came in today for [place] to look at this. He has not had a history of wounds in his buttocks in spite of his paraplegia. 12/08/17; seen in follow-up today at his usual appointment. He was seen earlier this week and found to have a new wound on his buttock. We also follow him for wounds on the left lateral leg, left first second toe web space and right first second toe web space 12/15/17; we have been using Hydrofera Blue to the left lateral leg which has improved. The right first second toe web space has also improved. Left first second toe web space plantar aspect looks stable. The left buttock has worsened using Santyl. Apparently the buttock has drainage 12/22/17; we have been using Hydrofera Blue to the left lateral leg which continues to improve now 2 small wounds separated by normal skin. He tells [place] he had a fever up to  100 yesterday he is prone to UTIs but has not noted anything different. He does in and out catheterizations. The area between the first and second toes today does not look good necrotic surface covered with what looks to be purulent drainage and erythema extending into the third toe. I had gotten this to something that I thought look better last time however it is not look good today. He also has a necrotic surface over the buttock wound which is expanded. I thought there might be infection under here so I removed a lot of the surface with a #5 curet though nothing look like it really needed culturing. He is been using Santyl to this area 12/27/17; his original wound on the left lateral leg continues to improve using Hydrofera Blue. I gave him samples of Baxdella although he was unable to take them out of fear for an allergic reaction ["lump in his throat"].the culture I did of the purulent drainage from his second toe last week showed both enterococcus and a set Enterobacter I was also concerned about the erythema on the bottom of his foot although paradoxically although this looks somewhat better today. 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 We've been using silver alginate here. They came up today that he sleeps in a recliner, I'm not sure why but I asked him to stop this 01/03/18; his original wound we've been using Hydrofera Blue is now separated into 2 areas. Ulcer on his left buttock is better he is off the recliner and sleeping in bed Finally both wound areas between his first and second toes also looks some better 01/10/18; his original wound on the left lateral leg is now separated into 2 wounds we've been using Hydrofera Blue Ulcer on his left buttock has some drainage. There is a small probing site going into muscle layer superiorly.using silver alginate -He arrives today with a deep tissue  injury on the left heel The wound on the dorsal aspect of his first second toe on the left looks a lot betterusing silver alginate ketoconazole The area on the first second toe web space on the right also looks a lot bette 01/17/18; his original wound on the left lateral leg continues to progress using Hydrofera Blue Ulcer on his left buttock also is smaller surface healthier except for a small probing site going into the muscle layer superiorly. 2.4 cm of tunneling in this area DTI on his left heel we have only been offloading. Looks better than last  week no threatened open no evidence of infection the wound on the dorsal aspect of the first second toe on the left continues to look like it's regressing we have only been using silver alginate and terbinafine orally The area in the first second toe web space on the right also looks to be a lot better using silver alginate and terbinafine I think this was prompted by tinea pedis 01/31/18; the patient was hospitalized in [place] last week apparently for a complicated UTI. He was discharged on cefepime he does in and out catheterizations. In the hospital he was discovered M I don't mild elevation of AST and ALTs and the terbinafine was stopped.predictably the pressure ulcer on his buttock looks betterusing silver alginate. The area on the left lateral leg also is better using Hydrofera Blue. The area between the first and second toes on the left better. First and second toes on the right still substantial but better. Finally the DTI on the left heel has held together and looks like it's resolving 02/07/18-he is here in follow-up evaluation for multiple ulcerations. He has new injury to the lateral aspect of the last issue a pressure ulcer, he states this is from adhesive removal trauma. He states he has tried multiple adhesive products with no success. All other ulcers appear stable. The left heel DTI is resolving. We will continue with  same treatment plan and follow-up next week. 02/14/18; follow-up for multiple areas. He has a new area last week on the lateral aspect of his pressure ulcer more over the posterior trochanter. The original pressure ulcer looks quite stable has healthy granulation. 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 Almost fully epithelialized on the original superior area using Hydrofera Blue DTI on the left heel has peeled off this week to reveal a small superficial wound under denuded skin and subcutaneous tissue Both areas between the first and second toes look better including nothing open on the left 02/21/18; The patient's wounds on his left ischial tuberosity and posterior left greater trochanter actually looked better. He has a large area of irritation around the area which I think is contact dermatitis. I am doubtful that this is fungal His original wound on the left lateral calf continues to improve we have been using Hydrofera Blue There is no open area in the left first second toe web space although there is a lot of thick callus The DTI on the left heel required debridement today of necrotic surface eschar and subcutaneous tissue using silver alginate Finally the area on the right first second toe webspace continues to contract using silver alginate and ketoconazole 02/28/18 Left ischial tuberosity wounds look better using silver alginate. Original wound on the left calf only has one small open area left using Hydrofera Blue DTI on the left heel required debridement mostly removing skin from around this wound surface. Using silver alginate The areas on the right first/second toe web space using silver alginate and ketoconazole 03/08/18 on evaluation today patient appears to be doing decently well as best I can tell in regard to his wounds. This is the first time that I have seen him as he  generally is followed by Dr. Dellia Nims. With that being said none of his wounds appear to be infected he does have an area where there is some skin covering what appears to be a new wound on the left dorsal surface of his great  toe. This is right at the nail bed. With that being said I do believe that debrided away some of the excess skin can be of benefit in this regard. Otherwise he has been tolerating the dressing changes without complication. 03/14/18; patient arrives today with the multiplicity of wounds that we are following. He has not been systemically unwell Original wound on the left lateral calf now only has 2 small open areas we've been using Hydrofera Blue which should continue The deep tissue injury on the left heel requires debridement today. We've been using silver alginate The left first second toe and the right first second toe are both are reminiscence what I think was tinea pedis. Apparently some of the callus Surface between the toes was removed last week when it started draining. Purulent drainage coming from the wound on the ischial tuberosity on the left. 03/21/18-He is here in follow-up evaluation for multiple wounds. 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 He tolerated debridement. The only change to last week's recommendations is to discontinue antifungal cream between toes. He will follow-up next week 03/28/18; following up for multiple wounds;Concern this week is streaking redness and swelling in the right foot. He is going to need antibiotics for this. 03/31/18; follow-up for right foot cellulitis. Streaking redness and swelling in the right foot on 03/28/18. 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 I put him on clindamycin 300 mg every 6 and brought him in for a quick check. He has an open wound between his first and second toes on the right foot as a potential  source. 04/04/18; Right foot cellulitis is resolving he is completing clindamycin. This is truly good news Left lateral calf wound which is initial wound only has one small open area inferiorly this is close to healing out. He has compression stockings. We will use Hydrofera Blue right down to the epithelialization of this Nonviable surface on the left heel which was initially pressure with a DTI. We've been using Hydrofera Blue. I'm going to switch this back to silver alginate Left first second toe/tinea pedis this looks better using silver alginate Right first second toe tinea pedis using silver alginate Large pressure ulcers on theLeft ischial tuberosity. Small wound here Looks better. I am uncertain about the surface over the large wound. Using silver alginate 04/11/18; Cellulitis in the right foot is resolved Left lateral calf wound which was his original wounds still has 2 tiny open areas remaining this is just about closed Nonviable surface on the left heel is better but still requires debridement Left first second toe/tinea pedis still open using silver alginate Right first second toe wound tinea pedis I asked him to go back to using ketoconazole and silver alginate Large pressure ulcers on the left ischial tuberosity this shear injury here is resolved. Wound is smaller. No evidence of infection using silver alginate 04/18/18; Patient arrives with an intense area of cellulitis in the right mid lower calf extending into the right heel area. Bright red and warm. Smaller area on the left anterior leg. He has a significant history of MRSA. He will definitely need antibioticsdoxycycline He now has 2 open areas on the left ischial tuberosity the original large wound and now a satellite area which I think was above his initial satellite areas. Not a wonderful surface on this satellite area surrounding erythema which looks like pressure related. His left lateral calf wound again his original  wound is  just about closed Left heel pressure injury still requiring debridement Left first second toe looks a lot better using silver alginate Right first second toe also using silver alginate and ketoconazole cream also looks better 04/20/18; the patient was worked in early today out of concerns with his cellulitis on the right leg. I had started him on doxycycline. This was 2 days ago. His wife was concerned about the swelling in the area. Also concerned about the left buttock. He has not been systemically unwell no fever chills. No nausea vomiting or diarrhea 04/25/18; the patient's left buttock wound is continued to deteriorate he is using Hydrofera Blue. He is still completing clindamycin for the cellulitis on the right leg although all of this looks better. 05/02/18 Left buttock wound still with a lot of drainage and a very tightly adherent fibrinous necrotic surface. He has a deeper area superiorly The left lateral calf wound is still closed DTI wound on the left heel necrotic surface especially the circumference using Iodoflex Areas between his left first second toe and right first second toe both look better. Dorsally and the right first second toe he had a necrotic surface although at smaller. In using silver alginate and ketoconazole. I did a culture last week which was a deep tissue culture of the reminiscence of the open wound on the right first second toe dorsally. This grew a few Acinetobacter and a few methicillin-resistant staph aureus. Nevertheless the area actually this week looked better. I didn't feel the need to specifically address this at least in terms of systemic antibiotics. 05/09/18; wounds are measuring larger more drainage per our intake. We are using Santyl covered with alginate on the large superficial buttock wounds, Iodosorb on the left heel, ketoconazole and silver alginate to the dorsal first and second toes bilaterally. 05/16/18; The area on his left buttock  better in some aspects although the area superiorly over the ischial tuberosity required an extensive debridement.using Santyl Left heel appears stable. Using Iodoflex The areas between his first and second toes are not bad however there is spreading erythema up the dorsal aspect of his left foot this looks like cellulitis again. He is insensate the erythema is really very brilliant.o Erysipelas He went to [name] an allergist days ago because he was itching part of this he had lab work done. This showed a white count of 15.1 with 70% neutrophils. Hemoglobin of 11.4 and a platelet count of 659,000. Last white count we had in Epic was a 2-1/2 years ago which was 25.9 but he was ill at the time. He was able to show me some lab work that was done by his primary physician the pattern is about the same. 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 But prompted me to go ahead and do x-rays of both feet and the pelvis rule out osteomyelitis. He also had a comprehensive metabolic panel this was reasonably normal his albumin was 3.7 liver function tests BUN/creatinine all normal 05/23/18; x-rays of both his feet from last week were negative for underlying pulmonary abnormality. The x-ray of his pelvis however showed mild irregularity in the left ischial which may represent some early osteomyelitis. The wound in the left ischial continues to get deeper clearly now exposed muscle. Each week necrotic surface material over this area. Whereas the rest of the wounds do not look so bad. The left ischial wound we have been using Santyl and calcium alginate To the left heel  surface necrotic debris using Iodoflex The left lateral leg is still healed Areas on the left dorsal foot and the right dorsal foot are about the same. There is some inflammation on the left which might represent contact dermatitis, fungal dermatitis I am doubtful cellulitis although this looks better  than last week 05/30/18; CT scan done at Hospital did not show any osteomyelitis or abscess. Suggested the possibility of underlying cellulitis although I don't [name] a lot of evidence of this at the bedside The wound itself on the left buttock/upper thigh actually looks somewhat better. No debridement Left heel also looks better no debridement continue Iodoflex Both dorsal first second toe spaces appear better using Lotrisone. Left still required debridement 06/06/18; Intake reported some purulent looking drainage from the left gluteal wound. Using Santyl and calcium alginate Left heel looks better although still a nonviable surface requiring debridement The left dorsal foot first/second webspace actually expanding and somewhat deeper. I may consider doing a shave biopsy of this area Right dorsal foot first/second webspace appears stable to improved. Using Lotrisone and silver alginate to both these areas 06/13/18 Left gluteal surface looks better. Now separated in the 2 wounds. No debridement required. Still drainage. We'll continue silver alginate Left heel continues to look better with Iodoflex continue this for at least another week Of his dorsal foot wounds the area on the left still has some depth although it looks better than last week. We've been using Lotrisone and silver alginate 06/20/18 Left gluteal continues to look better healthy tissue Left heel continues to look better healthy granulation wound is smaller. He is using Iodoflex and his long as this continues continue the Iodoflex Dorsal right foot looks better unfortunately dorsal left foot does not. There is swelling and erythema of his forefoot. He had minor trauma to this several days ago but doesn't think this was enough to have caused any tissue injury. Foot looks like cellulitis, we have had this problem before 06/27/18 on evaluation today patient appears to be doing a little worse in regard to his foot ulcer. 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 Both of which would really be his only options for treating this infection. In the past he has been given and effusion of Orbactiv. This is done very well for him in the past again it's one time dosing IV antibiotic therapy. Subsequently I do believe this is something we're gonna need to [name] about doing at this point in time. Currently his other wounds seem to be doing somewhat better in my pinion I'm pretty happy in that regard. 07/03/18 on evaluation today patient's wounds actually appear to be doing fairly well. He has been tolerating the dressing changes without complication. All in all he seems to be showing signs of improvement. In regard to the antibiotics he has been dealing with infectious disease since I saw him last week as far as getting this scheduled. In the end [facility]'s [facility] this coming Friday. In the meantime he has been continuing to perform the dressing changes in such as previous. There does not appear to be any evidence of infection worsengin at this time. 07/10/18; Since I last saw this man 2 [name] ago things have actually improved. IV antibiotics of resulted in less forefoot erythema although there is still  some present. He is not systemically unwell Left buttock wounds 2 now have no depth there is increased epithelialization Using silver alginate Left heel still requires debridement using Iodoflex Left dorsal foot still with a sizable wound about the size of a border but healthy granulation Right dorsal foot still with a slitlike area using silver alginate 07/18/18; the patient's cellulitis in the left foot is improved in fact I think it is on its way to resolving. Left buttock wounds 2 both look better although the larger one has hypertension  granulation we've been using silver alginate Left heel has some thick circumferential redundant skin over the wound edge which will need to be removed today we've been using Iodoflex Left dorsal foot is still a sizable wound required debridement using silver alginate The right dorsal foot is just about closed only a small open area remains here 07/25/18; left foot cellulitis is resolved Left buttock wounds 2 both look better. Hyper-granulation on the major area Left heel as some debris over the surface but otherwise looks a healthier wound. Using silver collagen Right dorsal foot is just about closed 07/31/18; arrives with our intake nurse worried about purulent drainage from the buttock. We had hyper-granulation here last week His buttock wounds 2 continue to look better Left heel some debris over the surface but measuring smaller. Right dorsal foot unfortunately has openings between the toes Left foot superficial wound looks less aggravated. 08/07/18 Buttock wounds continue to look better although some of her granulation and the larger medial wound. silver alginate Left heel continues to look a lot better.silver collagen Left foot superficial wound looks less stable. Requires debridement. He has a new wound superficial area on the foot on the lateral dorsal foot. Right foot looks better using silver alginate without Lotrisone 08/14/2018; patient was in the ER last week diagnosed with a UTI. He is now on Cefpodoxime and Macrodantin. Buttock wounds continued to be smaller. Using silver alginate Left heel continues to look better using silver collagen Left foot superficial wound looks as though it is improving Right dorsal foot area is just about healed. 08/21/2018; patient is completed his antibiotics for his UTI. He has 2 open areas on the buttocks. There is still not closed although the surface looks satisfactory. Using silver alginate Left heel continues to improve using silver  collagen The bilateral dorsal foot areas which are at the base of his first and second toes/possible tinea pedis are actually stable on the left but worse on the right. The area on the left required debridement of necrotic surface. After debridement I obtained a specimen for PCR culture. The right dorsal foot which is been just about healed last week is now reopened 08/28/2018; culture done on the left dorsal foot showed coag negative staph both staph epidermidis and Lugdunensis. I think this is worthwhile initiating systemic treatment. I will use doxycycline given his long list of allergies. The area on the left heel slightly improved but still requiring debridement. The large wound on the buttock is just about closed whereas the smaller one is larger. Using silver alginate in this area 09/04/2018; patient is completing his doxycycline for the left foot although this continues to be a very difficult wound area with very adherent necrotic debris. We are using silver alginate to all his wounds right foot left foot and the small wounds on his buttock, silver collagen on the left heel. 09/11/2018; once again this patient has intense erythema and swelling of the left forefoot. Lesser degrees of erythema  in the right foot. He has a long list of allergies and intolerances. I will reinstitute doxycycline. 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 Using silver alginate Left heel also looks better using silver collagen Unfortunately both the areas on his feet look worse. The area on the left first second webspace is now gone through to the plantar part of his foot. The area on the left foot anteriorly is irritated with erythema and swelling in the forefoot. 09/25/2018 His wound on the left plantar heel looks better. Using silver collagen The area on the left buttock 2 small remnant areas. One is closed one is still open. Using silver alginate The areas  between both his first and second toes look worse. This in spite of long-standing antifungal therapy with ketoconazole and silver alginate which should have antifungal activity He has small areas around his original wound on the left calf one is on the bottom of the original scar tissue and one superiorly both of these are small and superficial but again given wound history in this site this is worrisome 10/02/2018 Left plantar heel continues to gradually contract using silver collagen Left buttock wound is unchanged using silver alginate The areas on his dorsal feet between his first and second toes bilaterally look about the same. I prescribed clindamycin ointment to [name] if we can address chronic staph colonization and also the underlying possibility of erythrasma The left lateral lower extremity wound is actually on the lateral part of his ankle. Small open area here. We have been using silver alginate 10/09/2018; Left plantar heel continues to look healthy and contract. No debridement is required Left buttock slightly smaller with a tape injury wound just below which was new this week Dorsal feet somewhat improved I have been using clindamycin Left lateral looks lower extremity the actual open area looks worse although a lot of this is epithelialized. I am going to change to silver collagen today He has a lot more swelling in the right leg although this is not pitting not red and not particularly warm there is a lot of spasm in the right leg usually indicative of people with paralysis of some underlying discomfort. We have reviewed his vascular status from 2017 he had a left greater saphenous vein ablation. I wonder about referring him back to vascular surgery if the area on the left leg continues to deteriorate. 10/16/2018 in today for follow-up and management of multiple lower extremity ulcers. His left Buttock wound is much lower smaller and almost closed completely. The wound to the  left ankle has began to reopen with Epithelialization and some adherent slough. He has multiple new areas to the left foot and leg. The left dorsal foot without much improvement. Wound present between left great webspace and 2nd toe. Erythema and edema present right leg. Right LE ultrasound obtained on 10/10/18 was negative for DVT. 10/23/2018; Left buttock is closed over. Still dry macerated skin but there is no open wound. I suspect this is chronic pressure/moisture Left lateral calf is quite a bit worse than when I saw this last. There is clearly drainage here he has macerated skin into the left plantar heel. We will change the primary dressing to alginate Left dorsal foot has some improvement in overall wound area. Still using clindamycin and silver alginate Right dorsal foot about the same as the left using clindamycin and silver alginate The erythema in the right leg has resolved. He is  DVT rule out was negative Left heel pressure area required debridement although the wound is smaller and the surface is health 10/26/2018 The patient came back in for his nurse check today predominantly because of the drainage coming out of the left lateral leg with a recent reopening of his original wound on the left lateral calf. He comes in today with a large amount of surrounding erythema around the wound extending from the calf into the ankle and even in the area on the dorsal foot. He is not systemically unwell. He is not febrile. Nevertheless this looks like cellulitis. We have been using silver alginate to the area. I changed him to a regular visit and I am going to prescribe him doxycycline. The rationale here is a long list of medication intolerances and a history of MRSA. I did not [name] anything that I thought would provide a valuable culture 10/30/2018 Follow-up from his appointment 4 days ago with really an extensive area of cellulitis in the left calf left lateral ankle and left dorsal foot. I  put him on doxycycline. He has a long list of medication allergies which are true allergy reactions. 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 In any case he is a lot better today. The erythema especially in the anterior and lateral left calf is better. He still has left ankle erythema. He also is complaining about increasing edema in the right leg we have only been using Kerlix Coban and he has been doing the wraps at home. Finally he has a spotty rash on the medial part of his upper left calf which looks like folliculitis or perhaps wrap occlusion type injury. Small superficial macules not pustules 11/06/18 patient arrives today with again a considerable degree of erythema around the wound on the left lateral calf extending into the dorsal ankle and dorsal foot. This is a lot worse than when I saw this last week. He is on doxycycline really with not a lot of improvement. He has not been systemically unwell Wounds on the; left heel actually looks improved. Original area on the left foot and proximity to the first and second toes looks about the same. He has superficial areas on the dorsal foot, anterior calf and then the reopening of his original wound on the left lateral calf which looks about the same The only area he has on the right is the dorsal webspace first and second which is smaller. He has a large area of dry erythematous skin on the left buttock small open area here. 11/13/2018; the patient arrives in much better condition. The erythema around the wound on the left lateral calf is a lot better. Not sure whether this was the clindamycin or the TCA and ketoconazole or just in the improvement in edema control [stasis dermatitis]. In any case this is a lot better. The area on the left heel is very small and just about resolved using silver collagen we have been using silver alginate to the areas on his dorsal  feet 11/20/2018; his wounds include the left lateral calf, left heel, dorsal aspects of both feet just proximal to the first second webspace. He is stable to slightly improved. I did not think any changes to his dressings were going to be necessary 11/27/2018 he has a reopening on the left buttock which is surrounded by what looks like tinea or perhaps some other form of dermatitis. The area on the left dorsal foot  has some erythema around it I have marked this area but I am not sure whether this is cellulitis or not. Left heel is not closed. Left calf the reopening is really slightly longer and probably worse 1/13; in general things look better and smaller except for the left dorsal foot. Area on the left heel is just about closed, left buttock looks better only a small wound remains in the skin looks better [using Lotrisone] 1/20; the area on the left heel only has a few remaining open areas here. Left lateral calf about the same in terms of size, left dorsal foot slightly larger right lateral foot still not closed. The area on the left buttock has no open wound and the surrounding skin looks a lot better 1/27; the area on the left heel is closed. Left lateral calf better but still requiring extensive debridements. The area on his left buttock is closed. He still has the open areas on the left dorsal foot which is slightly smaller in the right foot which is slightly expanded. We have been using Iodoflex on these areas as well 2/3; left heel is closed. Left lateral calf still requiring debridement using Iodoflex there is no open area on his left buttock however he has dry scaly skin over a large area of this. Not really responding well to the Lotrisone. Finally the areas on his dorsal feet at the level of the first second webspace are slightly smaller on the right and about the same on the left. Both of these vigorously debrided with Anasept and gauze 2/10; left heel remains closed he has dry  erythematous skin over the left buttock but there is no open wound here. Left lateral leg has come in and with. Still requiring debridement we have been using Iodoflex here. Finally the area on the left dorsal foot and right dorsal foot are really about the same extremely dry callused fissured areas. He does not yet have a dermatology appointment 2/17; left heel remains closed. He has a new open area on the left buttock. The area on the left lateral calf is bigger longer and still covered in necrotic debris. No major change in his foot areas bilaterally. I am awaiting for a dermatologist to look on this. We have been using ketoconazole I do not know that this is been doing any good at all. 2/24; left heel remains closed. The left buttock wound that was new reopening last week looks better. The left lateral calf appears better also although still requires debridement. The major area on his foot is the left first second also requiring debridement. We have been putting Prisma on all wounds. I do not believe that the ketoconazole has done too much good for his feet. He will use Lotrisone I am going to give him a 2-week course of terbinafine. We still do not have a dermatology appointment 3/2 left heel remains closed however there is skin over bone in this area I pointed this out to him today. The left buttock wound is epithelialized but still does not look completely stable. The area on the left leg required debridement were using silver collagen here. With regards to his feet we changed to Lotrisone last week and silver alginate. 3/9; left heel remains closed. Left buttock remains closed. The area on the right foot is essentially closed. The left foot remains unchanged. Slightly smaller on the left lateral calf. Using silver collagen to both of these areas 3/16-Left heel remains closed. Area on right foot is closed.  Left lateral calf above the lateral malleolus open wound requiring debridement with  easy bleeding. Left dorsal wound proximal to first toe also debrided. Left ischial area open new. Patient has been using Prisma with wrapping every 3 days. Dermatology appointment is apparently tomorrow.Patient has completed his terbinafine 2-week course with some apparent improvement according to him, there is still flaking and dry skin in his foot on the left 3/23; area on the right foot is reopened. The area on the left anterior foot is about the same still a very necrotic adherent surface. He still has the area on the left leg and reopening is on the left buttock. He apparently saw dermatology although I do not have a note. According to the patient who is usually fairly well informed they did not have any good ideas. Put him on oral terbinafine which she is been on before. 3/30; using silver collagen to all wounds. Apparently his dermatologist put him on doxycycline and rifampin presumably some culture grew staph. I do not have this result. He remains on terbinafine although I have used terbinafine on him before 4/6; patient has had a fairly substantial reopening on the right foot between the first and second toes. He is finished his terbinafine and I believe is on doxycycline and rifampin still as prescribed by dermatology. We have been using silver collagen to all his wounds although the patient reports that he thinks silver alginate does better on the wounds on his buttock. 4/13; the area on his left lateral calf about the same size but it did not require debridement. Left dorsal foot just proximal to the webspace between the first and second toes is about the same. Still nonviable surface. I note some superficial bronze discoloration of the dorsal part of his foot Right dorsal foot just proximal to the first and second toes also looks about the same. I still think there may be the same discoloration I noted above on the left Left buttock wound looks about the same 4/20; left lateral calf  appears to be gradually contracting using silver collagen. 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 The left dorsal foot wound is debrided of tightly adherent necrotic debris and really cleans up quite nicely. The right area is worse with expansion. I did not debride this it is now over the base of the second toe The area on his left buttock is smaller no debridement is required using silver collagen 5/4; left calf continues to make good progress. He arrives with erythema around the wounds on his dorsal foot which even extends to the plantar aspect. Very concerning for coexistent infection. He is finished the erythromycin I gave him for possible erythrasma this does not seem to have helped. The area on the left foot is about the same base of the dorsal toes Is area on the buttock looks improved on the left 5/11; left calf and left buttock continued to make good progress. Left foot is about the same to slightly improved. Major problem is on the right foot. He has not had an x-ray. Deep tissue culture I did last week showed both Enterobacter and E. coli. I did not change the doxycycline I put him on empirically although neither 1 of these were plated to doxycycline. He arrives today with the erythema looking worse on both the dorsal and plantar foot. Macerated skin on the bottom of the foot. he has not been systemically unwell 5/18-Patient returns at 1 week, left calf wound  appears to be making some progress, left buttock wound appears slightly worse than last time, left foot wound looks slightly better, right foot redness is marginally better. X-ray of both feet show no air or evidence of osteomyelitis. Patient is finished his Omnicef and terbinafine. He continues to have macerated skin on the bottom of the left foot as well as right 5/26; left calf wound is better, left buttock wound appears to have multiple small superficial open areas  with surrounding macerated skin. X-rays that I did last time showed no evidence of osteomyelitis in either foot. He is finished cefdinir and doxycycline. I do not think that he was on terbinafine. He continues to have a large superficial open area on the right foot anterior dorsal and slightly between the first and second toes. I did send him to dermatology 2 months ago or so wondering about whether they would do a fungal scraping. I do not believe they did but did do a culture. We have been using silver alginate to the toe areas, he has been using antifungals at home topically either ketoconazole or Lotrisone. We are using silver collagen on the left foot, silver alginate on the right, silver collagen on the left lateral leg and silver alginate on the left buttock 6/1; left buttock area is healed. We have the left dorsal foot, left lateral leg and right dorsal foot. We are using silver alginate to the areas on both feet and silver collagen to the area on his left lateral calf 6/8; the left buttock apparently reopened late last week. He is not really sure how this happened. He is tolerating the terbinafine. Using silver alginate to all wounds 6/15; left buttock wound is larger than last week but still superficial. Came in the clinic today with a report of purulence from the left lateral leg I did not identify any infection Both areas on his dorsal feet appear to be better. He is tolerating the terbinafine. Using silver alginate to all wounds 6/22; left buttock is about the same this week, left calf quite a bit better. His left foot is about the same however he comes in with erythema and warmth in the right forefoot once again. Culture that I gave him in the beginning of May showed Enterobacter and E. coli. I gave him doxycycline and things seem to improve although neither 1 of these organisms was specifically plated. 6/29; left buttock is larger and dry this week. Left lateral calf looks to me to  be improved. Left dorsal foot also somewhat improved right foot completely unchanged. The erythema on the right foot is still present. He is completing the Ceftin dinner that I gave him empirically [[name] discussion above.) 7/6 - All wounds look to be stable and perhaps improved, the left buttock wound is slightly smaller, per patient bleeds easily, completed ceftin, the right foot redness is less, he is on terbinafine 7/13; left buttock wound about the same perhaps slightly narrower. Area on the left lateral leg continues to narrow. Left dorsal foot slightly smaller right foot about the same. We are using silver alginate on the right foot and Hydrofera Blue to the areas on the left. Unna boot on the left 2 layer compression on the right 7/20; left buttock wound absolutely the same. Area on lateral leg continues to get better. Left dorsal foot require debridement as did the right no major change in the 7/27; left buttock wound the same size necrotic debris over the surface. The area on the lateral leg  is closed once again. His left foot looks better right foot about the same although there is some involvement now of the posterior first second toe area. He is still on terbinafine which I have given him for a month, not certain a centimeter major change 06/25/19-All wounds appear to be slightly improved according to report, left buttock wound looks clean, both foot wounds have minimal to no debris the right dorsal foot has minimal slough. We are using Hydrofera Blue to the left and silver alginate to the right foot and ischial wound. 8/10-Wounds all appear to be around the same, the right forefoot distal part has some redness which was not there before, however the wound looks clean and small. Ischial wound looks about the same with no changes 8/17; his wound on the left lateral calf which was his original chronic venous insufficiency wound remains closed. Since I last saw him the areas on the left  dorsal foot right dorsal foot generally appear better but require debridement. The area on his left initial tuberosity appears somewhat larger to me perhaps hyper granulated and bleeds very easily. We have been using Hydrofera Blue to the left dorsal foot and silver alginate to everything else 8/24; left lateral calf remains closed. The areas on his dorsal feet on the webspace of the first and second toes bilaterally both look better. The area on the left buttock which is the pressure ulcer stage II slightly smaller. I change the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 leg is swollen warm and erythematous, probably more likely the source of the fever 09/06/16; have been using collagen to the major left leg wound, silver alginate to the area on his anterior foot/toes 09/13/16; the areas on his anterior foot/toes on both sides appear to be virtually closed. Extensive wound on the left lateral leg perhaps slightly narrower but each visit still covered an adherent surface slough 09/16/16 patient was in for his usual Thursday nurse visit however the intake nurse noted significant erythema of his dorsal right foot. He is also running a low-grade fever and having  increasing spasms in the right leg 09/20/16 here for cellulitis involving his right great toes and forefoot. This is a lot better. Still requiring debridement on his left lateral leg. Santyl direct says he needs prior authorization. Therefore his wife cannot change this at home 09/30/16; the patient's extensive area on the left lateral calf and ankle perhaps somewhat better. Using Santyl. The area on the left toes is healed and I think the area on his right dorsal foot is healed as well. There is no cellulitis or venous inflammation involving the right leg. He is going to need compression stockings here. 10/07/16; the patient's extensive wound on the left lateral calf and ankle does not measure any differently however there appears to be less adherent surface slough using Santyl and aggressive weekly debridements 10/21/16; no major change in the area on the left lateral calf. Still the same measurement still very difficult to debridement adherent slough and nonviable subcutaneous tissue. This is not really been helped by several [name] of Santyl. Previously for 2 [name] I used Iodoflex for a short period. A prolonged course of Hydrofera Blue didn't really help. I'm not sure why I only used 2 [name] of Iodoflex on this there is no evidence of surrounding infection. He has a small area on the right second toe which looks as though it's progressing towards closure 10/28/16; the wounds on his toes appear to be closed. No major change in the left lateral leg wound although the surface looks somewhat better using Iodoflex. He has had previous arterial studies that were normal. He has had reflux studies and is status post ablation although I don't have any exact notes on which vein was ablated. I'll need to check the surgical record 11/04/16; he's had a reopening between the first and second toe on the left and right. No major change in the left lateral leg wound. There is what appears to be cellulitis of the  left dorsal foot 11/18/16 the patient was hospitalized initially in [place] and then subsequently transferred to [facility] and was admitted there from 11/09/16 through 11/12/16. He had developed progressive cellulitis on the right leg in spite of the doxycycline I gave him. I'd spoken to the hospitalist in [place] who was concerned about continuing leukocytosis. CT scan is what I suggested this was done which showed soft tissue swelling without evidence of osteomyelitis or an underlying abscess blood cultures were negative. At [facility] he was treated with vancomycin and Primaxin and then add an infectious disease consult. He was transitioned to Ceftaroline. He has been making progressive improvement. Overall a severe cellulitis of the right leg. He is been using silver alginate to her original wound on the left leg. The wounds in his toes on the right are closed there is a small open area on the base of the left second toe  11/26/15; the patient's right leg is much better although there is still some edema here this could be reminiscent from his severe cellulitis likely on top of some degree of lymphedema. His left anterior leg wound has less surface slough as reported by her intake nurse. Small wound at the base of the left second toe 12/02/16; patient's right leg is better and there is no open wound here. His left anterior lateral leg wound continues to have a healthy-looking surface. Small wound at the base of the left second toe however there is erythema in the left forefoot which is worrisome 12/16/16; is no open wounds on his right leg. We took measurements for stockings. His left anterior lateral leg wound continues to have a healthy-looking surface. I'm not sure where we were with the Apligraf run through his insurance. We have been using Iodoflex. He has a thick eschar on the left first second toe interface, I suspect this may be fungal however there is no visible open 12/23/16; no open  wound on his right leg. He has 2 small areas left of the linear wound that was remaining last week. We have been using Prisma, I thought I have disclosed this week, we can only look forward to next week 01/03/17; the patient had concerning areas of erythema last week, already on doxycycline for UTI through his primary doctor. The erythema is absolutely no better there is warmth and swelling both medially from the left lateral leg wound and also the dorsal left foot. 01/06/17- Patient is here for follow-up evaluation of his left lateral leg ulcer and bilateral feet ulcers. He is on oral antibiotic therapy, tolerating that. Nursing staff and the patient states that the erythema is improved from Monday. 01/13/17; the predominant left lateral leg wound continues to be problematic. I had put Apligraf on him earlier this month once. However he subsequently developed what appeared to be an intense cellulitis around the left lateral leg wound. I gave him Dalvance I think on 2/12 perhaps 2/13 he continues on cefdinir. The erythema is still present but the warmth and swelling is improved. I am hopeful that the cellulitis part of this control. I wouldn't be surprised if there is an element of venous inflammation as well. 01/17/17. The erythema is present but better in the left leg. His left lateral leg wound still does not have a viable surface buttons certain parts of this long thin wound it appears like there has been improvement in dimensions. 01/20/17; the erythema still present but much better in the left leg. I'm thinking this is his usual degree of chronic venous inflammation. The wound on the left leg looks somewhat better. Is less surface slough 01/27/17; erythema is back to the chronic venous inflammation. The wound on the left leg is somewhat better. I am back to the point where I like to try an Apligraf once again 02/10/17; slight improvement in wound dimensions. Apligraf #2. He is completing his  doxycycline 02/14/17; patient arrives today having completed doxycycline last Thursday. This was supposed to be a nurse visit however once again he hasn't tense erythema from the medial part of his wound extending over the lower leg. Also erythema in his foot this is roughly in the same distribution as last time. He has baseline chronic venous inflammation however this is a lot worse than the baseline I have learned to accept the on him is baseline inflammation 02/24/17- patient is here for follow-up evaluation. He is tolerating compression therapy. His voicing no complaints  or concerns he is here anticipating an Apligraf 03/03/17; he arrives today with an adherent necrotic surface. I don't think this is surface is going to be amenable for Apligraf's. The erythema around his wound and on the left dorsal foot has resolved he is off antibiotics 03/10/17; better-looking surface today. I don't think he can tolerate Apligraf's. He tells me he had a wound VAC after a skin graft years ago to this area and they had difficulty with a seal. The erythema continues to be stable around this some degree of chronic venous inflammation but he also has recurrent cellulitis. We have been using Iodoflex 03/17/17; continued improvement in the surface and may be small changes in dimensions. Using Iodoflex which seems the only thing that will control his surface 03/24/17- He is here for follow up evaluation of his LLE lateral ulceration and ulcer to right dorsal foot/toe space. He is voicing no complaints or concerns, He is tolerating compression wrap. 03/31/17 arrives today with a much healthier looking wound on the left lower extremity. We have been using Iodoflex for a prolonged period of time which has for the first time prepared and adequate looking wound bed although we have not had much in the way of wound dimension improvement. He also has a small wound between the first and second toe on the right 04/07/17; arrives  today with a healthy-looking wound bed and at least the top 50% of this wound appears to be now her. No debridement was required I have changed him to [facility] last week after prolonged Iodoflex. He did not do well with Apligraf's. We've had a re-opening between the first and second toe on the right 04/14/17; arrives today with a healthier looking wound bed contractions and the top 50% of this wound and some on the lesser 50%. Wound bed appears healthy. The area between the first and second toe on the right still remains problematic 04/21/17; continued very gradual improvement. Using [employer] 04/28/17; continued very gradual improvement in the left lateral leg venous insufficiency wound. His periwound erythema is very mild. We have been using Hydrofera Blue. Wound is making progress especially in the superior 50% 05/05/17; he continues to have very gradual improvement in the left lateral venous insufficiency wound. Both in terms with an length rings are improving. I debrided this every 2 [name] with #5 curet and we have been using Hydrofera Blue and again making good progress With regards to the wounds between his right first and second toe which I thought might of been tinea pedis he is not making as much progress very dry scaly skin over the area. Also the area at the base of the left first and second toe in a similar condition 05/12/17; continued gradual improvement in the refractory left lateral venous insufficiency wound on the left. Dimension smaller. Surface still requiring debridement using Hydrofera Blue 05/19/17; continued gradual improvement in the refractory left lateral venous ulceration. Careful inspection of the wound bed underlying rumination suggested some degree of epithelialization over the surface no debridement indicated. Continue Hydrofera Blue difficult areas between his toes first and third on the left than first and second on the right. I'm going to change to silver  alginate from silver collagen. Continue ketoconazole as I suspect underlying tinea pedis 05/26/17; left lateral leg venous insufficiency wound. We've been using Hydrofera Blue. I believe that there is expanding epithelialization over the surface of the wound albeit not coming from the wound circumference. This is a bit of an odd  situation in which the epithelialization seems to be coming from the surface of the wound rather than in the exact circumference. There is still small open areas mostly along the lateral margin of the wound. ooHe has unchanged areas between the left first and second and the right first second toes which I been treating for tenia pedis 06/02/17; left lateral leg venous insufficiency wound. We have been using Hydrofera Blue. Somewhat smaller from the wound circumference. The surface of the wound remains a bit on it almost epithelialized sedation in appearance. I use an open curette today debridement in the surface of all of this especially the edges ooSmall open wounds remaining on the dorsal right first and second toe interspace and the plantar left first second toe and her face on the left 06/09/17; wound on the left lateral leg continues to be smaller but very gradual and very dry surface using Hydrofera Blue 06/16/17 requires weekly debridements now on the left lateral leg although this continues to contract. I changed to silver collagen last week because of dryness of the wound bed. Using Iodoflex to the areas on his first and second toes/web space bilaterally 06/24/17; patient with history of paraplegia also chronic venous insufficiency with lymphedema. Has a very difficult wound on the left lateral leg. This has been gradually reducing in terms of with but comes in with a very dry adherent surface. High switch to silver collagen a week or so ago with hydrogel to keep the area moist. This is been refractory to multiple dressing attempts. He also has areas in his first and  second toes bilaterally in the anterior and posterior web space. I had been using Iodoflex here after a prolonged course of silver alginate with ketoconazole was ineffective [question tinea pedis] 07/14/17; patient arrives today with a very difficult adherent material over his left lateral lower leg wound. He also has surrounding erythema and poorly controlled edema. He was switched his Santyl last visit which the nurses are applying once during his doctor visit and once on a nurse visit. He was also reduced to 2 layer compression I'm not exactly sure of the issue here. 07/21/17; better surface today after 1 week of Iodoflex. Significant cellulitis that we treated last week also better. [Doxycycline] 07/28/17 better surface today with now 2 [name] of Iodoflex. Significant cellulitis treated with doxycycline. He has now completed the doxycycline and he is back to his usual degree of chronic venous inflammation/stasis dermatitis. He reminds me he has had ablations surgery here 08/04/17; continued improvement with Iodoflex to the left lateral leg wound in terms of the surface of the wound although the dimensions are better. 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 Problematic areas on the plantar aspect of the first second toe web space on the left and the dorsal aspect of the first second toe web space on the right. At one point I felt these were probably related to chronic fungal infections in treated him aggressively for this although we have not made any improvement here. 08/11/17; left lateral leg. Surface continues to improve with the Iodoflex although we are not seeing much improvement in overall wound dimensions. Areas on his plantar left foot and right foot show no improvement. In fact the right foot looks somewhat worse 08/18/17; left lateral leg. We changed to [facility] Blue last week after a prolonged course of Iodoflex  which helps get the surface better. It appears that the wound with is  improved. Continue with difficult areas on the left dorsal first second and plantar first second on the right 09/01/17; patient arrives in clinic today having had a temperature of 103 yesterday. He was seen in the ER and [facility]. The patient was concerned he could have cellulitis again in the right leg however they diagnosed him with a UTI and he is now on Keflex. He has a history of cellulitis which is been recurrent and difficult but this is been in the left leg, in the past 5 use doxycycline. He does in and out catheterizations at home which are risk factors for UTI 09/08/17; patient will be completing his Keflex this weekend. The erythema on the left leg is considerably better. He has a new wound today on the medial part of the right leg small superficial almost looks like a skin tear. He has worsening of the area on the right dorsal first and second toe. His major area on the left lateral leg is better. Using Hydrofera Blue on all areas 09/15/17; gradual reduction in width on the long wound in the left lateral leg. No debridement required. He also has wounds on the plantar aspect of his left first second toe web space and on the dorsal aspect of the right first second toe web space. 09/22/17; there continues to be very gradual improvements in the dimensions of the left lateral leg wound. He hasn't round erythematous spot with might be pressure on his wheelchair. There is no evidence obviously of infection no purulence no warmth ooHe has a dry scaled area on the plantar aspect of the left first second toe ooImproved area on the dorsal right first second toe. 09/29/17; left lateral leg wound continues to improve in dimensions mostly with an is still a fairly long but increasingly narrow wound. ooHe has a dry scaled area on the plantar aspect of his left first second toe web space ooIncreasingly concerning area on the  dorsal right first second toe. In fact I am concerned today about possible cellulitis around this wound. The areas extending up his second toe and although there is deformities here almost appears to abut on the nailbed. 10/06/17; left lateral leg wound continues to make very gradual progress. Tissue culture I did from the right first second toe dorsal foot last time grew MRSA and enterococcus which was vancomycin sensitive. This was not sensitive to clindamycin or doxycycline. He is allergic to Zyvox and sulfa we have therefore arrange for him to have dalvance infusion tomorrow. He is had this in the past and tolerated it well 10/20/17; left lateral leg wound continues to make decent progress. This is certainly reduced in terms of with there is advancing epithelialization.ooThe cellulitis in the right foot looks better although he still has a deep wound in the dorsal aspect of the first second toe web space. Plantar left first toe web space on the left I think is making some progress 10/27/17; left lateral leg wound continues to make decent progress. Advancing epithelialization.using Hydrofera Blue ooThe right first second toe web space wound is better-looking using silver alginate ooImprovement in the left plantar first second toe web space. Again using silver alginate 11/03/17 left lateral leg wound continues to make decent progress albeit slowly. Using Hydrofera Blue ooThe right per second toe web space continues to be a very problematic looking punched out wound. I obtained a piece of tissue for deep culture I did extensively treated this for fungus. It is difficult to imagine that this is a pressure  area as the patient states other than going outside he doesn't really wear shoes at home ooThe left plantar first second toe web space looked fairly senescent. Necrotic edges. This required debridement oochange to Hydrofera Blue to all wound areas 11/10/17; left lateral leg wound continues  to contract. Using Hydrofera Blue ooOn the right dorsal first second toe web space dorsally. Culture I did of this area last week grew MRSA there is not an easy oral option in this patient was multiple antibiotic allergies or intolerances. This was only a rare culture isolate I'm therefore going to use Bactroban under silver alginate ooOn the left plantar first second toe web space. Debridement is required here. This is also unchanged 11/17/17; left lateral leg wound continues to contract using Hydrofera Blue this is no longer the major issue. ooThe major concern here is the right first second toe web space. He now has an open area going from dorsally to the plantar aspect. There is now wound on the inner lateral part of the first toe. Not a very viable surface on this. There is erythema spreading medially into the forefoot. ooNo major change in the left first second toe plantar wound 11/24/17; left lateral leg wound continues to contract using Hydrofera Blue. Nice improvement today ooThe right first second toe web space all of this looks a lot less angry than last week. I have given him clindamycin and topical Bactroban for MRSA and terbinafine for the possibility of underlining tinea pedis that I could not control with ketoconazole. Looks somewhat better ooThe area on the plantar left first second toe web space is weeping with dried debris around the wound 12/01/17; left lateral leg wound continues to contract he Hydrofera Blue. It is becoming thinner in terms of with nevertheless it is making good improvement. ooThe right first second toe web space looks less angry but still a large necrotic-looking wounds starting on the plantar aspect of the right foot extending between the toes and now extensively on the base of the right second toe. I gave him clindamycin and topical Bactroban for MRSA anterior benefiting for the possibility of underlying tinea pedis. Not looking better today ooThe  area on the left first/second toe looks better. Debrided of necrotic debris 12/05/17* the patient was worked in urgently today because over the weekend he found blood on his incontinence bad when he woke up. He was found to have an ulcer by his wife who does most of his wound care. He came in today for [place] to look at this. He has not had a history of wounds in his buttocks in spite of his paraplegia. 12/08/17; seen in follow-up today at his usual appointment. He was seen earlier this week and found to have a new wound on his buttock. We also follow him for wounds on the left lateral leg, left first second toe web space and right first second toe web space 12/15/17; we have been using Hydrofera Blue to the left lateral leg which has improved. The right first second toe web space has also improved. Left first second toe web space plantar aspect looks stable. The left buttock has worsened using Santyl. Apparently the buttock has drainage 12/22/17; we have been using Hydrofera Blue to the left lateral leg which continues to improve now 2 small wounds separated by normal skin. He tells [place] he had a fever up to 100 yesterday he is prone to UTIs but has not noted anything different. He does in and out catheterizations. The area  between the first and second toes today does not look good necrotic surface covered with what looks to be purulent drainage and erythema extending into the third toe. I had gotten this to something that I thought look better last time however it is not look good today. He also has a necrotic surface over the buttock wound which is expanded. I thought there might be infection under here so I removed a lot of the surface with a #5 curet though nothing look like it really needed culturing. He is been using Santyl to this area 12/27/17; his original wound on the left lateral leg continues to improve using Hydrofera Blue. I gave him samples of Baxdella although he was unable to take them out  of fear for an allergic reaction ["lump in his throat"].the culture I did of the purulent drainage from his second toe last week showed both enterococcus and a set Enterobacter I was also concerned about the erythema on the bottom of his foot although paradoxically although this looks somewhat better today. 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 We've been using silver alginate here. They came up today that he sleeps in a recliner, I'm not sure why but I asked him to stop this 01/03/18; his original wound we've been using Hydrofera Blue is now separated into 2 areas. ooUlcer on his left buttock is better he is off the recliner and sleeping in bed ooFinally both wound areas between his first and second toes also looks some better 01/10/18; his original wound on the left lateral leg is now separated into 2 wounds we've been using Hydrofera Blue ooUlcer on his left buttock has some drainage. There is a small probing site going into muscle layer superiorly.using silver alginate -He arrives today with a deep tissue injury on the left heel ooThe wound on the dorsal aspect of his first second toe on the left looks a lot betterusing silver alginate ketoconazole ooThe area on the first second toe web space on the right also looks a lot bette 01/17/18; his original wound on the left lateral leg continues to progress using Hydrofera Blue ooUlcer on his left buttock also is smaller surface healthier except for a small probing site going into the muscle layer superiorly. 2.4 cm of tunneling in this area ooDTI on his left heel we have only been offloading. Looks better than last week no threatened open no evidence of infection oothe wound on the dorsal aspect of the first second toe on the left continues to look like it's regressing we have only been using silver alginate and terbinafine orally ooThe  area in the first second toe web space on the right also looks to be a lot better using silver alginate and terbinafine I think this was prompted by tinea pedis 01/31/18; the patient was hospitalized in [place] last week apparently for a complicated UTI. He was discharged on cefepime he does in and out catheterizations. In the hospital he was discovered M I don't mild elevation of AST and ALTs and the terbinafine was stopped.predictably the pressure ulcer on his buttock looks betterusing silver alginate. The area on the left lateral leg also is better using Hydrofera Blue. The area between the first and second toes on the left better. First and second toes on the right still substantial but better. Finally the DTI on the left heel has held together and looks like it's resolving 02/07/18-he is  here in follow-up evaluation for multiple ulcerations. He has new injury to the lateral aspect of the last issue a pressure ulcer, he states this is from adhesive removal trauma. He states he has tried multiple adhesive products with no success. All other ulcers appear stable. The left heel DTI is resolving. We will continue with same treatment plan and follow-up next week. 02/14/18; follow-up for multiple areas. ooHe has a new area last week on the lateral aspect of his pressure ulcer more over the posterior trochanter. The original pressure ulcer looks quite stable has healthy granulation. 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 Almost fully epithelialized on the original superior area using Hydrofera Blue ooDTI on the left heel has peeled off this week to reveal a small superficial wound under denuded skin and subcutaneous tissue ooBoth areas between the first and second toes look better including nothing open on the left 02/21/18; ooThe patient's wounds on his left ischial tuberosity and  posterior left greater trochanter actually looked better. He has a large area of irritation around the area which I think is contact dermatitis. I am doubtful that this is fungal ooHis original wound on the left lateral calf continues to improve we have been using Hydrofera Blue ooThere is no open area in the left first second toe web space although there is a lot of thick callus ooThe DTI on the left heel required debridement today of necrotic surface eschar and subcutaneous tissue using silver alginate ooFinally the area on the right first second toe webspace continues to contract using silver alginate and ketoconazole 02/28/18 ooLeft ischial tuberosity wounds look better using silver alginate. ooOriginal wound on the left calf only has one small open area left using Hydrofera Blue ooDTI on the left heel required debridement mostly removing skin from around this wound surface. Using silver alginate ooThe areas on the right first/second toe web space using silver alginate and ketoconazole 03/08/18 on evaluation today patient appears to be doing decently well as best I can tell in regard to his wounds. This is the first time that I have seen him as he generally is followed by Dr. Dellia Nims. With that being said none of his wounds appear to be infected he does have an area where there is some skin covering what appears to be a new wound on the left dorsal surface of his great toe. This is right at the nail bed. With that being said I do believe that debrided away some of the excess skin can be of benefit in this regard. Otherwise he has been tolerating the dressing changes without complication. 03/14/18; patient arrives today with the multiplicity of wounds that we are following. He has not been systemically unwell ooOriginal wound on the left lateral calf now only has 2 small open areas we've been using Hydrofera Blue which should continue ooThe deep tissue injury on the left heel requires  debridement today. We've been using silver alginate ooThe left first second toe and the right first second toe are both are reminiscence what I think was tinea pedis. Apparently some of the callus Surface between the toes was removed last week when it started draining. ooPurulent drainage coming from the wound on the ischial tuberosity on the left. 03/21/18-He is here in follow-up evaluation for multiple wounds. 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 He tolerated debridement. The only  change to last week's recommendations is to discontinue antifungal cream between toes. He will follow-up next week 03/28/18; following up for multiple wounds;Concern this week is streaking redness and swelling in the right foot. He is going to need antibiotics for this. 03/31/18; follow-up for right foot cellulitis. Streaking redness and swelling in the right foot on 03/28/18. 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 I put him on clindamycin 300 mg every 6 and brought him in for a quick check. He has an open wound between his first and second toes on the right foot as a potential source. 04/04/18; ooRight foot cellulitis is resolving he is completing clindamycin. This is truly good news ooLeft lateral calf wound which is initial wound only has one small open area inferiorly this is close to healing out. He has compression stockings. We will use Hydrofera Blue right down to the epithelialization of this ooNonviable surface on the left heel which was initially pressure with a DTI. We've been using Hydrofera Blue. I'm going to switch this back to silver alginate ooLeft first second toe/tinea pedis this looks better using silver alginate ooRight first second toe tinea pedis using silver alginate ooLarge pressure ulcers on theLeft ischial tuberosity. Small wound here Looks better. I am uncertain about the surface over  the large wound. Using silver alginate 04/11/18; ooCellulitis in the right foot is resolved ooLeft lateral calf wound which was his original wounds still has 2 tiny open areas remaining this is just about closed ooNonviable surface on the left heel is better but still requires debridement ooLeft first second toe/tinea pedis still open using silver alginate ooRight first second toe wound tinea pedis I asked him to go back to using ketoconazole and silver alginate ooLarge pressure ulcers on the left ischial tuberosity this shear injury here is resolved. Wound is smaller. No evidence of infection using silver alginate 04/18/18; ooPatient arrives with an intense area of cellulitis in the right mid lower calf extending into the right heel area. Bright red and warm. Smaller area on the left anterior leg. He has a significant history of MRSA. He will definitely need antibioticsoodoxycycline ooHe now has 2 open areas on the left ischial tuberosity the original large wound and now a satellite area which I think was above his initial satellite areas. Not a wonderful surface on this satellite area surrounding erythema which looks like pressure related. ooHis left lateral calf wound again his original wound is just about closed ooLeft heel pressure injury still requiring debridement ooLeft first second toe looks a lot better using silver alginate ooRight first second toe also using silver alginate and ketoconazole cream also looks better 04/20/18; the patient was worked in early today out of concerns with his cellulitis on the right leg. I had started him on doxycycline. This was 2 days ago. His wife was concerned about the swelling in the area. Also concerned about the left buttock. He has not been systemically unwell no fever chills. No nausea vomiting or diarrhea 04/25/18; the patient's left buttock wound is continued to deteriorate he is using Hydrofera Blue. He is still  completing clindamycin for the cellulitis on the right leg although all of this looks better. 05/02/18 ooLeft buttock wound still with a lot of drainage and a very tightly adherent fibrinous necrotic surface. He has a deeper area superiorly ooThe left lateral calf wound is still closed ooDTI wound on the left heel necrotic surface especially the circumference using Iodoflex ooAreas between his left first second  toe and right first second toe both look better. Dorsally and the right first second toe he had a necrotic surface although at smaller. In using silver alginate and ketoconazole. I did a culture last week which was a deep tissue culture of the reminiscence of the open wound on the right first second toe dorsally. This grew a few Acinetobacter and a few methicillin-resistant staph aureus. Nevertheless the area actually this week looked better. I didn't feel the need to specifically address this at least in terms of systemic antibiotics. 05/09/18; wounds are measuring larger more drainage per our intake. We are using Santyl covered with alginate on the large superficial buttock wounds, Iodosorb on the left heel, ketoconazole and silver alginate to the dorsal first and second toes bilaterally. 05/16/18; ooThe area on his left buttock better in some aspects although the area superiorly over the ischial tuberosity required an extensive debridement.using Santyl ooLeft heel appears stable. Using Iodoflex ooThe areas between his first and second toes are not bad however there is spreading erythema up the dorsal aspect of his left foot this looks like cellulitis again. He is insensate the erythema is really very brilliant.o Erysipelas He went to [name] allergist days ago because he was itching part of this he had lab work done. This showed a white count of 15.1 with 70% neutrophils. Hemoglobin of 11.4 and a platelet count of 659,000. Last white count we had in Epic was a 2-1/2 years ago  which was 25.9 but he was ill at the time. He was able to show me some lab work that was done by his primary physician the pattern is about the same. 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 But prompted me to go ahead and do x-rays of both feet and the pelvis rule out osteomyelitis. He also had a comprehensive metabolic panel this was reasonably normal his albumin was 3.7 liver function tests BUN/creatinine all normal 05/23/18; x-rays of both his feet from last week were negative for underlying pulmonary abnormality. The x-ray of his pelvis however showed mild irregularity in the left ischial which may represent some early osteomyelitis. The wound in the left ischial continues to get deeper clearly now exposed muscle. Each week necrotic surface material over this area. Whereas the rest of the wounds do not look so bad. ooThe left ischial wound we have been using Santyl and calcium alginate ooTo the left heel surface necrotic debris using Iodoflex ooThe left lateral leg is still healed ooAreas on the left dorsal foot and the right dorsal foot are about the same. There is some inflammation on the left which might represent contact dermatitis, fungal dermatitis I am doubtful cellulitis although this looks better than last week 05/30/18; CT scan done at Hospital did not show any osteomyelitis or abscess. Suggested the possibility of underlying cellulitis although I don't [name] a lot of evidence of this at the bedside ooThe wound itself on the left buttock/upper thigh actually looks somewhat better. No debridement ooLeft heel also looks better no debridement continue Iodoflex ooBoth dorsal first second toe spaces appear better using Lotrisone. Left still required debridement 06/06/18; ooIntake reported some purulent looking drainage from the left gluteal wound. Using Santyl and calcium alginate ooLeft heel looks better although still a nonviable  surface requiring debridement ooThe left dorsal foot first/second webspace actually expanding and somewhat deeper. I may consider doing a shave biopsy of this area ooRight dorsal foot first/second webspace appears stable  to improved. Using Lotrisone and silver alginate to both these areas 06/13/18 ooLeft gluteal surface looks better. Now separated in the 2 wounds. No debridement required. Still drainage. We'll continue silver alginate ooLeft heel continues to look better with Iodoflex continue this for at least another week ooOf his dorsal foot wounds the area on the left still has some depth although it looks better than last week. We've been using Lotrisone and silver alginate 06/20/18 ooLeft gluteal continues to look better healthy tissue ooLeft heel continues to look better healthy granulation wound is smaller. He is using Iodoflex and his long as this continues continue the Iodoflex ooDorsal right foot looks better unfortunately dorsal left foot does not. There is swelling and erythema of his forefoot. He had minor trauma to this several days ago but doesn't think this was enough to have caused any tissue injury. Foot looks like cellulitis, we have had this problem before 06/27/18 on evaluation today patient appears to be doing a little worse in regard to his foot ulcer. 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 Both of which would really be his only options for treating this infection. In the past he has been given and effusion of Orbactiv. This is done very well for him in the past again it's one time dosing IV antibiotic therapy. Subsequently I do believe this is something we're gonna need to [name] about doing at this point in time. Currently his other wounds seem to be doing somewhat better in my pinion I'm pretty happy in that  regard. 07/03/18 on evaluation today patient's wounds actually appear to be doing fairly well. He has been tolerating the dressing changes without complication. All in all he seems to be showing signs of improvement. In regard to the antibiotics he has been dealing with infectious disease since I saw him last week as far as getting this scheduled. In the end [facility]'s [facility] this coming Friday. In the meantime he has been continuing to perform the dressing changes in such as previous. There does not appear to be any evidence of infection worsengin at this time. 07/10/18; ooSince I last saw this man 2 [name] ago things have actually improved. IV antibiotics of resulted in less forefoot erythema although there is still some present. He is not systemically unwell ooLeft buttock wounds o2 now have no depth there is increased epithelialization Using silver alginate ooLeft heel still requires debridement using Iodoflex ooLeft dorsal foot still with a sizable wound about the size of a border but healthy granulation ooRight dorsal foot still with a slitlike area using silver alginate 07/18/18; the patient's cellulitis in the left foot is improved in fact I think it is on its way to resolving. ooLeft buttock wounds o2 both look better although the larger one has hypertension granulation we've been using silver alginate ooLeft heel has some thick circumferential redundant skin over the wound edge which will need to be removed today we've been using Iodoflex ooLeft dorsal foot is still a sizable wound required debridement using silver alginate ooThe right dorsal foot is just about closed only a small open area remains here 07/25/18; left foot cellulitis is resolved ooLeft buttock wounds o2 both look better. Hyper-granulation on the major area ooLeft heel as some debris over the surface but otherwise looks a  healthier wound. Using silver  collagen ooRight dorsal foot is just about closed 07/31/18; arrives with our intake nurse worried about purulent drainage from the buttock. We had hyper-granulation here last week ooHis buttock wounds o2 continue to look better ooLeft heel some debris over the surface but measuring smaller. ooRight dorsal foot unfortunately has openings between the toes ooLeft foot superficial wound looks less aggravated. 08/07/18 ooButtock wounds continue to look better although some of her granulation and the larger medial wound. silver alginate ooLeft heel continues to look a lot better.silver collagen ooLeft foot superficial wound looks less stable. Requires debridement. He has a new wound superficial area on the foot on the lateral dorsal foot. ooRight foot looks better using silver alginate without Lotrisone 08/14/2018; patient was in the ER last week diagnosed with a UTI. He is now on Cefpodoxime and Macrodantin. ooButtock wounds continued to be smaller. Using silver alginate ooLeft heel continues to look better using silver collagen ooLeft foot superficial wound looks as though it is improving ooRight dorsal foot area is just about healed. 08/21/2018; patient is completed his antibiotics for his UTI. ooHe has 2 open areas on the buttocks. There is still not closed although the surface looks satisfactory. Using silver alginate ooLeft heel continues to improve using silver collagen ooThe bilateral dorsal foot areas which are at the base of his first and second toes/possible tinea pedis are actually stable on the left but worse on the right. The area on the left required debridement of necrotic surface. After debridement I obtained a specimen for PCR culture. ooThe right dorsal foot which is been just about healed last week is now reopened 08/28/2018; culture done on the left dorsal foot showed coag negative staph both staph epidermidis and Lugdunensis. I think this is worthwhile  initiating systemic treatment. I will use doxycycline given his long list of allergies. The area on the left heel slightly improved but still requiring debridement. ooThe large wound on the buttock is just about closed whereas the smaller one is larger. Using silver alginate in this area 09/04/2018; patient is completing his doxycycline for the left foot although this continues to be a very difficult wound area with very adherent necrotic debris. We are using silver alginate to all his wounds right foot left foot and the small wounds on his buttock, silver collagen on the left heel. 09/11/2018; once again this patient has intense erythema and swelling of the left forefoot. Lesser degrees of erythema in the right foot. He has a long list of allergies and intolerances. I will reinstitute doxycycline. 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 Using silver alginate ooLeft heel also looks better using silver collagen ooUnfortunately both the areas on his feet look worse. The area on the left first second webspace is now gone through to the plantar part of his foot. The area on the left foot anteriorly is irritated with erythema and swelling in the forefoot. 09/25/2018 ooHis wound on the left plantar heel looks better. Using silver collagen ooThe area on the left buttock 2 small remnant areas. One is closed one is still open. Using silver alginate ooThe areas between both his first and second toes look worse. This in spite of long-standing antifungal therapy with ketoconazole and silver alginate which should have antifungal activity ooHe has small areas around his original wound on the left calf one is on the bottom of the original scar tissue and one superiorly both of  these are small and superficial but again given wound history in this site this is worrisome 10/02/2018 ooLeft plantar heel continues to gradually contract using silver  collagen ooLeft buttock wound is unchanged using silver alginate ooThe areas on his dorsal feet between his first and second toes bilaterally look about the same. 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 Small open area here. We have been using silver alginate 10/09/2018; ooLeft plantar heel continues to look healthy and contract. No debridement is required ooLeft buttock slightly smaller with a tape injury wound just below which was new this week ooDorsal feet somewhat improved I have been using clindamycin ooLeft lateral looks lower extremity the actual open area looks worse although a lot of this is epithelialized. I am going to change to silver collagen today He has a lot more swelling in the right leg although this is not pitting not red and not particularly warm there is a lot of spasm in the right leg usually indicative of people with paralysis of some underlying discomfort. We have reviewed his vascular status from 2017 he had a left greater saphenous vein ablation. I wonder about referring him back to vascular surgery if the area on the left leg continues to deteriorate. 10/16/2018 in today for follow-up and management of multiple lower extremity ulcers. His left Buttock wound is much lower smaller and almost closed completely. The wound to the left ankle has began to reopen with Epithelialization and some adherent slough. He has multiple new areas to the left foot and leg. The left dorsal foot without much improvement. Wound present between left great webspace and 2nd toe. Erythema and edema present right leg. Right LE ultrasound obtained on 10/10/18 was negative for DVT. 10/23/2018; ooLeft buttock is closed over. Still dry macerated skin but there is no open wound. I suspect this is  chronic pressure/moisture ooLeft lateral calf is quite a bit worse than when I saw this last. There is clearly drainage here he has macerated skin into the left plantar heel. We will change the primary dressing to alginate ooLeft dorsal foot has some improvement in overall wound area. Still using clindamycin and silver alginate ooRight dorsal foot about the same as the left using clindamycin and silver alginate ooThe erythema in the right leg has resolved. He is DVT rule out was negative ooLeft heel pressure area required debridement although the wound is smaller and the surface is health 10/26/2018 ooThe patient came back in for his nurse check today predominantly because of the drainage coming out of the left lateral leg with a recent reopening of his original wound on the left lateral calf. He comes in today with a large amount of surrounding erythema around the wound extending from the calf into the ankle and even in the area on the dorsal foot. He is not systemically unwell. He is not febrile. Nevertheless this looks like cellulitis. We have been using silver alginate to the area. I changed him to a regular visit and I am going to prescribe him doxycycline. The rationale here is a long list of medication intolerances and a history of MRSA. I did not [name] anything that I thought would provide a valuable culture 10/30/2018 ooFollow-up from his appointment 4 days ago with really an extensive area of cellulitis in the left calf left lateral ankle and left dorsal  foot. I put him on doxycycline. He has a long list of medication allergies which are true allergy reactions. 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 In any case he is a lot better today. The erythema especially in the anterior and lateral left calf is better. He still has left ankle erythema. He also is complaining about increasing edema in the right leg we  have only been using Kerlix Coban and he has been doing the wraps at home. Finally he has a spotty rash on the medial part of his upper left calf which looks like folliculitis or perhaps wrap occlusion type injury. Small superficial macules not pustules 11/06/18 patient arrives today with again a considerable degree of erythema around the wound on the left lateral calf extending into the dorsal ankle and dorsal foot. This is a lot worse than when I saw this last week. He is on doxycycline really with not a lot of improvement. He has not been systemically unwell Wounds on the; left heel actually looks improved. Original area on the left foot and proximity to the first and second toes looks about the same. He has superficial areas on the dorsal foot, anterior calf and then the reopening of his original wound on the left lateral calf which looks about the same ooThe only area he has on the right is the dorsal webspace first and second which is smaller. ooHe has a large area of dry erythematous skin on the left buttock small open area here. 11/13/2018; the patient arrives in much better condition. The erythema around the wound on the left lateral calf is a lot better. Not sure whether this was the clindamycin or the TCA and ketoconazole or just in the improvement in edema control [stasis dermatitis]. In any case this is a lot better. The area on the left heel is very small and just about resolved using silver collagen we have been using silver alginate to the areas on his dorsal feet 11/20/2018; his wounds include the left lateral calf, left heel, dorsal aspects of both feet just proximal to the first second webspace. He is stable to slightly improved. I did not think any changes to his dressings were going to be necessary 11/27/2018 he has a reopening on the left buttock which is surrounded by what looks like tinea or perhaps some other form of dermatitis. The area on the left dorsal foot has some  erythema around it I have marked this area but I am not sure whether this is cellulitis or not. Left heel is not closed. Left calf the reopening is really slightly longer and probably worse 1/13; in general things look better and smaller except for the left dorsal foot. Area on the left heel is just about closed, left buttock looks better only a small wound remains in the skin looks better [using Lotrisone] 1/20; the area on the left heel only has a few remaining open areas here. Left lateral calf about the same in terms of size, left dorsal foot slightly larger right lateral foot still not closed. The area on the left buttock has no open wound and the surrounding skin looks a lot better 1/27; the area on the left heel is closed. Left lateral calf better but still requiring extensive debridements. The area on his left buttock is closed. He still has the open areas on the left dorsal foot which is slightly smaller in the right foot which is slightly  expanded. We have been using Iodoflex on these areas as well 2/3; left heel is closed. Left lateral calf still requiring debridement using Iodoflex there is no open area on his left buttock however he has dry scaly skin over a large area of this. Not really responding well to the Lotrisone. Finally the areas on his dorsal feet at the level of the first second webspace are slightly smaller on the right and about the same on the left. Both of these vigorously debrided with Anasept and gauze 2/10; left heel remains closed he has dry erythematous skin over the left buttock but there is no open wound here. Left lateral leg has come in and with. Still requiring debridement we have been using Iodoflex here. Finally the area on the left dorsal foot and right dorsal foot are really about the same extremely dry callused fissured areas. He does not yet have a dermatology appointment 2/17; left heel remains closed. He has a new open area on the left buttock. The  area on the left lateral calf is bigger longer and still covered in necrotic debris. No major change in his foot areas bilaterally. I am awaiting for a dermatologist to look on this. We have been using ketoconazole I do not know that this is been doing any good at all. 2/24; left heel remains closed. The left buttock wound that was new reopening last week looks better. The left lateral calf appears better also although still requires debridement. The major area on his foot is the left first second also requiring debridement. We have been putting Prisma on all wounds. I do not believe that the ketoconazole has done too much good for his feet. He will use Lotrisone I am going to give him a 2-week course of terbinafine. We still do not have a dermatology appointment 3/2 left heel remains closed however there is skin over bone in this area I pointed this out to him today. The left buttock wound is epithelialized but still does not look completely stable. The area on the left leg required debridement were using silver collagen here. With regards to his feet we changed to Lotrisone last week and silver alginate. 3/9; left heel remains closed. Left buttock remains closed. The area on the right foot is essentially closed. The left foot remains unchanged. Slightly smaller on the left lateral calf. Using silver collagen to both of these areas 3/16-Left heel remains closed. Area on right foot is closed. Left lateral calf above the lateral malleolus open wound requiring debridement with easy bleeding. Left dorsal wound proximal to first toe also debrided. Left ischial area open new. Patient has been using Prisma with wrapping every 3 days. Dermatology appointment is apparently tomorrow.Patient has completed his terbinafine 2-week course with some apparent improvement according to him, there is still flaking and dry skin in his foot on the left 3/23; area on the right foot is reopened. The area on the left  anterior foot is about the same still a very necrotic adherent surface. He still has the area on the left leg and reopening is on the left buttock. He apparently saw dermatology although I do not have a note. According to the patient who is usually fairly well informed they did not have any good ideas. Put him on oral terbinafine which she is been on before. 3/30; using silver collagen to all wounds. Apparently his dermatologist put him on doxycycline and rifampin presumably some culture grew staph. I do not have this result. He  remains on terbinafine although I have used terbinafine on him before 4/6; patient has had a fairly substantial reopening on the right foot between the first and second toes. He is finished his terbinafine and I believe is on doxycycline and rifampin still as prescribed by dermatology. We have been using silver collagen to all his wounds although the patient reports that he thinks silver alginate does better on the wounds on his buttock. 4/13; the area on his left lateral calf about the same size but it did not require debridement. ooLeft dorsal foot just proximal to the webspace between the first and second toes is about the same. Still nonviable surface. I note some superficial bronze discoloration of the dorsal part of his foot ooRight dorsal foot just proximal to the first and second toes also looks about the same. I still think there may be the same discoloration I noted above on the left ooLeft buttock wound looks about the same 4/20; left lateral calf appears to be gradually contracting using silver collagen. 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 The left dorsal foot wound is debrided of tightly adherent necrotic debris and really cleans up quite nicely. The right area is worse with expansion. I did not debride this it is now over the base of the second toe ooThe area on his left buttock is  smaller no debridement is required using silver collagen 5/4; left calf continues to make good progress. ooHe arrives with erythema around the wounds on his dorsal foot which even extends to the plantar aspect. Very concerning for coexistent infection. He is finished the erythromycin I gave him for possible erythrasma this does not seem to have helped. ooThe area on the left foot is about the same base of the dorsal toes ooIs area on the buttock looks improved on the left 5/11; left calf and left buttock continued to make good progress. Left foot is about the same to slightly improved. ooMajor problem is on the right foot. He has not had an x-ray. Deep tissue culture I did last week showed both Enterobacter and E. coli. I did not change the doxycycline I put him on empirically although neither 1 of these were plated to doxycycline. He arrives today with the erythema looking worse on both the dorsal and plantar foot. Macerated skin on the bottom of the foot. he has not been systemically unwell 5/18-Patient returns at 1 week, left calf wound appears to be making some progress, left buttock wound appears slightly worse than last time, left foot wound looks slightly better, right foot redness is marginally better. X-ray of both feet show no air or evidence of osteomyelitis. Patient is finished his Omnicef and terbinafine. He continues to have macerated skin on the bottom of the left foot as well as right 5/26; left calf wound is better, left buttock wound appears to have multiple small superficial open areas with surrounding macerated skin. X-rays that I did last time showed no evidence of osteomyelitis in either foot. He is finished cefdinir and doxycycline. I do not think that he was on terbinafine. He continues to have a large superficial open area on the right foot anterior dorsal and slightly between the first and second toes. I did send him to dermatology 2 months ago or so wondering about  whether they would do a fungal scraping. I do not believe they did but did do a culture. We have been using silver alginate to the toe areas, he  has been using antifungals at home topically either ketoconazole or Lotrisone. We are using silver collagen on the left foot, silver alginate on the right, silver collagen on the left lateral leg and silver alginate on the left buttock 6/1; left buttock area is healed. We have the left dorsal foot, left lateral leg and right dorsal foot. We are using silver alginate to the areas on both feet and silver collagen to the area on his left lateral calf 6/8; the left buttock apparently reopened late last week. He is not really sure how this happened. He is tolerating the terbinafine. Using silver alginate to all wounds 6/15; left buttock wound is larger than last week but still superficial. ooCame in the clinic today with a report of purulence from the left lateral leg I did not identify any infection ooBoth areas on his dorsal feet appear to be better. He is tolerating the terbinafine. Using silver alginate to all wounds 6/22; left buttock is about the same this week, left calf quite a bit better. His left foot is about the same however he comes in with erythema and warmth in the right forefoot once again. Culture that I gave him in the beginning of May showed Enterobacter and E. coli. I gave him doxycycline and things seem to improve although neither 1 of these organisms was specifically plated. 6/29; left buttock is larger and dry this week. Left lateral calf looks to me to be improved. Left dorsal foot also somewhat improved right foot completely unchanged. The erythema on the right foot is still present. He is completing the Ceftin dinner that I gave him empirically [[name] discussion above.) 7/6 - All wounds look to be stable and perhaps improved, the left buttock wound is slightly smaller, per patient bleeds easily, completed ceftin, the right foot  redness is less, he is on terbinafine 7/13; left buttock wound about the same perhaps slightly narrower. Area on the left lateral leg continues to narrow. Left dorsal foot slightly smaller right foot about the same. We are using silver alginate on the right foot and Hydrofera Blue to the areas on the left. Unna boot on the left 2 layer compression on the right 7/20; left buttock wound absolutely the same. Area on lateral leg continues to get better. Left dorsal foot require debridement as did the right no major change in the 7/27; left buttock wound the same size necrotic debris over the surface. The area on the lateral leg is closed once again. His left foot looks better right foot about the same although there is some involvement now of the posterior first second toe area. He is still on terbinafine which I have given him for a month, not certain a centimeter major change 06/25/19-All wounds appear to be slightly improved according to report, left buttock wound looks clean, both foot wounds have minimal to no debris the right dorsal foot has minimal slough. We are using Hydrofera Blue to the left and silver alginate to the right foot and ischial wound. 8/10-Wounds all appear to be around the same, the right forefoot distal part has some redness which was not there before, however the wound looks clean and small. Ischial wound looks about the same with no changes 8/17; his wound on the left lateral calf which was his original chronic venous insufficiency wound remains closed. Since I last saw him the areas on the left dorsal foot right dorsal foot generally appear better but require debridement. The area on his left initial tuberosity appears  somewhat larger to me perhaps hyper granulated and bleeds very easily. We have been using Hydrofera Blue to the left dorsal foot and silver alginate to everything else 8/24; left lateral calf remains closed. The areas on his dorsal feet on the webspace of the  first and second toes bilaterally both look better. The area on the left buttock which is the pressure ulcer stage II slightly smaller. I change the dressing to Hydrofera Blue to all areas 8/31; left lateral calf remains closed. The area on his dorsal feet bilaterally look better. Using Hydrofera Blue. Still requiring debridement on the left foot. No change in the left buttock pressure ulcers however 9/14; left lateral calf remains closed. Dorsal feet look quite a bit better than 2 [name] ago. Flaking dry skin also a lot better with the ammonium lactate I gave him 2 [name] ago. The area on the left buttock is improved. He states that his Roho cushion developed a leak and he is getting a new one, in the interim he is offloading this vigorously 9/21; left calf remains closed. Left heel which was a possible DTI looks better this week. He had macerated tissue around the left dorsal foot right foot looks satisfactory and improved left buttock wound. I changed his dressings to his feet to silver alginate bilaterally. Continuing Hydrofera Blue on the left buttock. 9/28 left calf remains closed. Left heel did not develop anything [possible DTI] dry flaking skin on the left dorsal foot. Right foot looks satisfactory. Improved left buttock wound. We are using silver alginate on his feet Hydrofera Blue on the buttock. I have asked him to go back to the Lotrisone on his feet including the wounds and surrounding areas 10/5; left calf remains closed. The areas on the left and right feet about the same. A lot of this is epithelialized however debris over the remaining open areas. He is using Lotrisone and silver alginate. The area on the left buttock using Hydrofera Blue 10/26. Patient has been out for 3 [name] secondary to Covid concerns. He tested negative but I think his wife tested positive. He comes in today with the left foot substantially worse, right foot about the same. Even more concerning he states that  the area on his left buttock closed over but then reopened and is considerably deeper in one aspect than it was before [stage III wound] 11/2; left foot really about the same as last week. Quarter sized wound on the dorsal foot just proximal to the first second toes. Surrounding erythema with areas of denuded epithelium. This is not really much different looking. Did not look like cellulitis this time however. ooRight foot area about the same.. We have been using silver alginate alginate on his toes ooLeft buttock still substantial irritated skin around the wound which I think looks somewhat better. We have been using Hydrofera Blue here. 11/9; left foot larger than last week and a very necrotic surface. Right foot I think is about the same perhaps slightly smaller. Debris around the circumference also addressed. Unfortunately on the left buttock there is been a decline. Satellite lesions below the major wound distally and now a an additional one posteriorly we have been using Hydrofera Blue but I think this is a pressure issue 11/16; left foot ulcer dorsally again a very adherent necrotic surface. Right foot is about the same. Not much change in the pressure ulcer on his left buttock. 11/30; left foot ulcer dorsally basically the same as when I saw him 2 [name]  ago. Very adherent fibrinous debris on the wound surface. Patient reports a lot of drainage as well. 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 We have been using Iodoflex, patient changed back to alginate because of the drainage. Area on his right dorsal foot really looks benign with a healthier surface certainly a lot better than on the left. Left buttock wounds all improved using Hydrofera Blue 12/7; left dorsal foot again no improvement. Tightly adherent debris. PCR culture I did last week only showed likely skin contaminant. I have gone ahead and done a punch biopsy of this which is  about the last thing in terms of investigations I can think to do. He has known venous insufficiency and venous hypertension and this could be the issue here. The area on the right foot is about the same left buttock slightly worse according to our intake nurse secondary to [facility] Blue sticking to the wound 12/14; biopsy of the left foot that I did last time showed changes that could be related to wound healing/chronic stasis dermatitis phenomenon no neoplasm. We have been using silver alginate to both feet. I change the one on the left today to Sorbact and silver alginate to his other 2 wounds 12/28; the patient arrives with the following problems; ooMajor issue is the dorsal left foot which continues to be a larger deeper wound area. Still with a completely nonviable surface ooParadoxically the area mirror image on the right on the right dorsal foot appears to be getting better. ooHe had some loss of dry denuded skin from the lower part of his original wound on the left lateral calf. Some of this area looked a little vulnerable and for this reason we put him in wrap that on this side this week ooThe area on his left buttock is larger. He still has the erythematous circular area which I think is a combination of pressure, sweat. This does not look like cellulitis or fungal dermatitis 11/26/2019; -Dorsal left foot large open wound with depth. Still debris over the surface. Using Sorbact ooThe area on the dorsal right foot paradoxically has closed over [facility] [place] has a reopening on the left ankle laterally at the base of his original wound that extended up into the calf. This appears clean. ooThe left buttock wound is smaller but with very adherent necrotic debris over the surface. We have been using silver alginate here as well The patient had arterial studies done in 2017. He had biphasic waveforms at the dorsalis pedis and posterior tibial bilaterally. ABI in the left was 1.17. Digit  waveforms were dampened. He has slight spasticity in the great toes I do not think a TBI would be possible 1/11; the patient comes in today with a sizable reopening between the first and second toes on the right. This is not exactly in the same location where we have been treating wounds previously. According to our intake nurse this was actually fairly deep but 0.6 cm. The area on the left dorsal foot looks about the same the surface is somewhat cleaner using Sorbact, his MRI is in 2 days. We have not managed yet to get arterial studies. The new reopening on the left lateral calf looks somewhat better using alginate. The left buttock wound is about the same using alginate 1/18; the patient had his ARTERIAL studies which were quite normal. ABI in the right at 1.13 with triphasic/biphasic waveforms on the left ABI 1.06 again with triphasic/biphasic waveforms. It would not  have been possible to have done a toe brachial index because of spasticity. We have been using Sorbac to the left foot alginate to the rest of his wounds on the right foot left lateral calf and left buttock Objective Constitutional Sitting or standing Blood Pressure is within target range for patient.. Pulse regular and within target range for patient.Marland Kitchen Respirations regular, non-labored and within target range.. Temperature is normal and within the target range for the patient.Marland Kitchen Appears in no distress. Vitals Time Taken: 7:50 AM, Height: 70 in, Weight: 216 lbs, BMI: 31, Temperature: 98.4 F, Pulse: 109 bpm, Respiratory Rate: 18 breaths/min, Blood Pressure: 129/71 mmHg. General Notes: Wound exam ooLeft dorsal foot some improvements. Surface debris comes off a lot easier #5 curette. Post debridement the wound looks a lot healthier. ooOn the right is very difficult to [name] between the right first and second toes because of spasticity however there is still an open wound here. The area was cleaned out we will continue to use  alginate. ooOn the left lateral calf his original wound extending towards the ankle. Still some callus distally with areas that do not look fully epithelialized. ooThe left buttock irritated in a square distribution. He is leaving dressings on for 2 days including border foam. I asked him to use an ABD instead of the border foam and change this daily if necessary. Back on the Lotrisone as the surrounding skin is erythematous and irritated but this is clearly not cellulitis question contact dermatitis Integumentary (Hair, Skin) Contact dermatitis distribution on the left buttock wound. Thick scaly callus in his feet and toes is really difficult to explain but I think he may need an exfoliant going forward. Wound #24 status is Open. Original cause of wound was Gradually Appeared. The wound is located on the Left,Dorsal Foot. The wound measures 3.2cm length x 2.4cm width x 0.3cm depth; 6.032cm^2 area and 1.81cm^3 volume. There is Fat Layer (Subcutaneous Tissue) Exposed exposed. There is no tunneling or undermining noted. There is a medium amount of serosanguineous drainage noted. The wound margin is distinct with the outline attached to the wound base. There is small (1-33%) red granulation within the wound bed. There is a large (67- 100%) amount of necrotic tissue within the wound bed including Adherent Slough. Wound #35 status is Open. Original cause of wound was Gradually Appeared. The wound is located on the Left Ischium. The wound measures 3cm length x 2.7cm width x 0.2cm depth; 6.362cm^2 area and 1.272cm^3 volume. There is Fat Layer (Subcutaneous Tissue) Exposed exposed. There is no tunneling or undermining noted. There is a medium amount of serosanguineous drainage noted. The wound margin is flat and intact. There is large (67-100%) red, friable granulation within the wound bed. There is no necrotic tissue within the wound bed. Wound #37 status is Open. Original cause of wound was Gradually  Appeared. The wound is located on the Left,Lateral Malleolus. The wound measures 2cm length x 0.9cm width x 0.1cm depth; 1.414cm^2 area and 0.141cm^3 volume. There is Fat Layer (Subcutaneous Tissue) Exposed exposed. There is no tunneling or undermining noted. There is a medium amount of serosanguineous drainage noted. The wound margin is distinct with the outline attached to the wound base. There is large (67-100%) pink granulation within the wound bed. There is a small (1-33%) amount of necrotic tissue within the wound bed including Adherent Slough. Wound #38 status is Open. Original cause of wound was Gradually Appeared. The wound is located on the Right Toe - Web between 1st  and 2nd. The wound measures 3cm length x 0.8cm width x 0.1cm depth; 1.885cm^2 area and 0.188cm^3 volume. There is Fat Layer (Subcutaneous Tissue) Exposed exposed. There is no tunneling or undermining noted. There is a medium amount of serosanguineous drainage noted. The wound margin is distinct with the outline attached to the wound base. There is medium (34-66%) pink, pale granulation within the wound bed. There is a medium (34-66%) amount of necrotic tissue within the wound bed including Adherent Slough. 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 There was a Excisional Skin/Subcutaneous Tissue Debridement with a total area of 7.68 sq cm performed by [name] [name]., MD. With the following instrument(s): Curette to remove Viable and Non-Viable tissue/material. Material removed includes Subcutaneous Tissue and Slough and.  No specimens were taken. A time out was conducted at 08:07, prior to the start of the procedure. A Minimum amount of bleeding was controlled with Pressure. The procedure was tolerated well with a pain level of 0 throughout and a pain level of 0 following the procedure. Post Debridement Measurements: 3.2cm length x 2.4cm width x 0.3cm depth; 1.81cm^3 volume. Character of Wound/Ulcer Post Debridement is improved. Post procedure Diagnosis Wound #24: Same as Pre-Procedure Pre-procedure diagnosis of Wound #24 is an Inflammatory located on the Left,Dorsal Foot . There was a Three Layer Compression Therapy Procedure by [name] Hurst, RN. 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 There was a Three Layer Compression Therapy Procedure by [name] Hurst, RN. Post procedure Diagnosis Wound #37: Same as Pre-Procedure Plan Follow-up Appointments: Return Appointment in 1 week. Dressing Change Frequency: Wound #24 Left,Dorsal Foot: Do not change entire dressing for one week. Wound #35 Left Ischium: Change Dressing every other day. Wound #37 Left,Lateral Malleolus: Do not change entire dressing for one week. Wound #38 Right Toe - Web between 1st and 2nd: Change Dressing every other day. Skin Barriers/Peri-Wound Care: Antifungal cream - on toes on both feet daily Moisturizing lotion - both legs Other: - Triamcinolone cream Wound #24 Left,Dorsal Foot: Barrier cream Moisturizing lotion - to both legs and feet Wound #35 Left Ischium: Antifungal cream - mix with barrier cream Barrier cream Primary Wound Dressing: Wound #24 Left,Dorsal Foot: Other: - Sorbact Wound #35 Left Ischium: Calcium Alginate Wound #37 Left,Lateral Malleolus: Calcium Alginate Wound #38 Right Toe - Web between 1st and 2nd: Calcium Alginate Secondary Dressing: Wound #24 Left,Dorsal Foot: Dry  Gauze Heel Cup - add heel cup to left heel for protection. Wound #35 Left Ischium: Foam Border - or ABD pad and tape Wound #37 Left,Lateral Malleolus: Dry Gauze Wound #38 Right Toe - Web between 1st and 2nd: Kerlix/Rolled Gauze - secure with tape Dry Gauze Edema Control: 3 Layer Compression System - Left Lower Extremity Elevate legs to the level of the heart or above for 30 minutes daily and/or when sitting, a frequency of: - throughout the day Support Garment 30-40 mm/Hg pressure to: - Juxtalite to right leg Off-Loading: Low air-loss mattress (Group 2) Roho cushion for wheelchair Turn and reposition every 2 hours - out  of wheelchair throughout the day, try to lay on sides, sleep in the bed not the recliner 1 continue the Sorbact to the left foot 2. There is no arterial issues 3. We could not get through prior authorization because extreme wait times for his MRI of the foot this is the last procedure I can really think of to exclude an underlying coexistent factor here. 4. Silver calcium alginate to the other wound areas 5. The area on the left lateral calf is just about closed over Electronic Signature(s) Signed: 12/11/2019 4:08:35 PM By: [name] Ham MD Entered By: [name] [name] on 12/10/2019 08:23:03 -------------------------------------------------------------------------------- SuperBill Details Patient Name: Date of Service: Thien, [name] 12/10/2019 Medical Record [id number] Patient Account Number: [id number] Date of Birth/Sex: Treating RN: [birth year] (34 y.o. 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

## 2019-12-12 ENCOUNTER — Encounter (HOSPITAL_COMMUNITY): Payer: Self-pay

## 2019-12-12 ENCOUNTER — Ambulatory Visit (HOSPITAL_COMMUNITY): Payer: BC Managed Care – PPO

## 2019-12-14 DIAGNOSIS — L97922 Non-pressure chronic ulcer of unspecified part of left lower leg with fat layer exposed: Secondary | ICD-10-CM | POA: Diagnosis not present

## 2019-12-17 ENCOUNTER — Encounter (HOSPITAL_BASED_OUTPATIENT_CLINIC_OR_DEPARTMENT_OTHER): Payer: BC Managed Care – PPO | Admitting: Internal Medicine

## 2019-12-17 ENCOUNTER — Other Ambulatory Visit: Payer: Self-pay

## 2019-12-17 DIAGNOSIS — L03116 Cellulitis of left lower limb: Secondary | ICD-10-CM | POA: Diagnosis not present

## 2019-12-17 DIAGNOSIS — L97512 Non-pressure chronic ulcer of other part of right foot with fat layer exposed: Secondary | ICD-10-CM | POA: Diagnosis not present

## 2019-12-17 DIAGNOSIS — Z87891 Personal history of nicotine dependence: Secondary | ICD-10-CM | POA: Diagnosis not present

## 2019-12-17 DIAGNOSIS — Z8614 Personal history of Methicillin resistant Staphylococcus aureus infection: Secondary | ICD-10-CM | POA: Diagnosis not present

## 2019-12-17 DIAGNOSIS — G8221 Paraplegia, complete: Secondary | ICD-10-CM | POA: Diagnosis not present

## 2019-12-17 DIAGNOSIS — L97522 Non-pressure chronic ulcer of other part of left foot with fat layer exposed: Secondary | ICD-10-CM | POA: Diagnosis not present

## 2019-12-17 DIAGNOSIS — Z888 Allergy status to other drugs, medicaments and biological substances status: Secondary | ICD-10-CM | POA: Diagnosis not present

## 2019-12-17 DIAGNOSIS — Z881 Allergy status to other antibiotic agents status: Secondary | ICD-10-CM | POA: Diagnosis not present

## 2019-12-17 DIAGNOSIS — L98412 Non-pressure chronic ulcer of buttock with fat layer exposed: Secondary | ICD-10-CM | POA: Diagnosis not present

## 2019-12-17 DIAGNOSIS — Z880 Allergy status to penicillin: Secondary | ICD-10-CM | POA: Diagnosis not present

## 2019-12-17 DIAGNOSIS — Z882 Allergy status to sulfonamides status: Secondary | ICD-10-CM | POA: Diagnosis not present

## 2019-12-17 DIAGNOSIS — L97322 Non-pressure chronic ulcer of left 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: Secondary | ICD-10-CM | POA: Diagnosis not present

## 2019-12-17 DIAGNOSIS — L98492 Non-pressure chronic ulcer of skin of other sites with fat layer exposed: Secondary | ICD-10-CM | POA: Diagnosis not present

## 2019-12-17 DIAGNOSIS — I872 Venous insufficiency (chronic) (peripheral): Secondary | ICD-10-CM | POA: Diagnosis not present

## 2019-12-18 NOTE — Progress Notes (Signed)
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 the area moist. This is been refractory to multiple dressing attempts. He also has areas in his first and second toes bilaterally in the anterior and posterior web space. I had been using Iodoflex here after a prolonged course of silver alginate with ketoconazole was ineffective [question tinea pedis] 07/14/17; patient arrives today with a very difficult adherent material over his left lateral lower leg wound. He also has surrounding erythema and poorly controlled edema. He was switched his Santyl last visit which the nurses are applying once during his doctor visit and once on a nurse visit. He was also reduced to 2 layer compression I'm not exactly sure of the issue here. 07/21/17; better surface today after 1 week of Iodoflex. Significant cellulitis that we treated last week also better. [Doxycycline] 07/28/17 better surface today with now 2 weeks of Iodoflex. Significant cellulitis treated with doxycycline. He has now completed the doxycycline and he is back to his usual degree of  chronic venous inflammation/stasis dermatitis. He reminds me he has had ablations surgery here 08/04/17; continued improvement with Iodoflex to the  left lateral leg wound in terms of the surface of the wound although the dimensions are better. 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 Problematic areas on the plantar aspect of the first second toe web space on the left and the dorsal aspect of the first second toe web space on the right. At one point I felt these were probably related to chronic fungal infections in treated him aggressively for this although we have not made any improvement here. 08/11/17; left lateral leg. Surface continues to improve with the Iodoflex although we are not seeing much improvement in overall wound dimensions. Areas on his plantar left foot and right foot show no improvement. In fact the right foot looks somewhat worse 08/18/17; left lateral leg. We changed to [facility] Blue last week after a prolonged course of Iodoflex which helps get the surface better. It appears that the wound with is improved. Continue with difficult areas on the left dorsal first second and plantar first second on the right 09/01/17; patient arrives in clinic today having had a temperature of 103 yesterday. He was seen in the ER and [facility]. The patient was concerned he could have cellulitis again in the right leg however they diagnosed him with a UTI and he is now on Keflex. He has a history of cellulitis which is been recurrent and difficult but this is been in the left leg, in the past 5 use doxycycline. He does in and out catheterizations at home which are risk factors for UTI 09/08/17; patient will be completing his Keflex this weekend. The erythema on the left leg is considerably better. He has a new wound today on the medial part of the right leg small superficial almost looks like a skin tear. He has worsening of the area on the right dorsal first and second toe. His major area on the left lateral leg is better. Using Hydrofera Blue on all areas 09/15/17;  gradual reduction in width on the long wound in the left lateral leg. No debridement required. He also has wounds on the plantar aspect of his left first second toe web space and on the dorsal aspect of the right first second toe web space. 09/22/17; there continues to be very gradual improvements in the dimensions of the left lateral leg wound. He hasn't round erythematous spot with might be pressure on his wheelchair. There is no evidence obviously of infection no purulence no warmth He has a dry scaled area on the plantar aspect of the left first second toe Improved area on the dorsal right first second toe. 09/29/17; left lateral leg wound continues to improve in dimensions mostly with an is still a fairly long but increasingly narrow wound. He has a dry scaled area on the plantar aspect of his left first second toe web space Increasingly concerning area on the dorsal right first second toe. In fact I am concerned today about possible cellulitis around this wound. The areas extending up his second toe and although there is deformities here almost appears to abut on the nailbed. 10/06/17; left lateral leg wound continues to make very gradual progress. Tissue culture I did from the right first second toe dorsal foot last time grew MRSA and enterococcus which was vancomycin sensitive. This was not sensitive to clindamycin or doxycycline. He is allergic to Zyvox and  sulfa we have therefore arrange for him to have dalvance infusion tomorrow. He is had this in the past and tolerated it well 10/20/17; left lateral leg wound continues to make decent progress. This is certainly reduced in terms of with there is advancing epithelialization.The cellulitis in the right foot looks better although he still has a deep wound in the dorsal aspect of the first second toe web space. Plantar left first toe web space on the left I think is making some progress 10/27/17; left lateral leg wound continues to make  decent progress. Advancing epithelialization.using Hydrofera Blue The right first second toe web space wound is better-looking using silver alginate Improvement in the left plantar first second toe web space. Again using silver alginate 11/03/17 left lateral leg wound continues to make decent progress albeit slowly. Using [facility] The right per second toe web space continues to be a very problematic looking punched out wound. I obtained a piece of tissue for deep culture I did extensively treated this for fungus. It is difficult to imagine that this is a pressure area as the patient states other than going outside he doesn't really wear shoes at home The left plantar first second toe web space looked fairly senescent. Necrotic edges. This required debridement change to [facility] Blue to all wound areas 11/10/17; left lateral leg wound continues to contract. Using Hydrofera Blue On the right dorsal first second toe web space dorsally. Culture I did of this area last week grew MRSA there is not an easy oral option in this patient was multiple antibiotic allergies or intolerances. This was only a rare culture isolate I'm therefore going to use Bactroban under silver alginate On the left plantar first second toe web space. Debridement is required here. This is also unchanged 11/17/17; left lateral leg wound continues to contract using Hydrofera Blue this is no longer the major issue. The major concern here is the right first second toe web space. He now has an open area going from dorsally to the plantar aspect. There is now wound on the inner lateral part of the first toe. Not a very viable surface on this. There is erythema spreading medially into the forefoot. No major change in the left first second toe plantar wound 11/24/17; left lateral leg wound continues to contract using Hydrofera Blue. Nice improvement today The right first second toe web space all of this looks a lot less angry than  last week. I have given him clindamycin and topical Bactroban for MRSA and terbinafine for the possibility of underlining tinea pedis that I could not control with ketoconazole. Looks somewhat better The area on the plantar left first second toe web space is weeping with dried debris around the wound 12/01/17; left lateral leg wound continues to contract he Hydrofera Blue. It is becoming thinner in terms of with nevertheless it is making good improvement. The right first second toe web space looks less angry but still a large necrotic-looking wounds starting on the plantar aspect of the right foot extending between the toes and now extensively on the base of the right second toe. I gave him clindamycin and topical Bactroban for MRSA anterior benefiting for the possibility of underlying tinea pedis. Not looking better today The area on the left first/second toe looks better. Debrided of necrotic debris 12/05/17* the patient was worked in urgently today because over the weekend he found blood on his incontinence bad when he woke up. He was found to have an ulcer by his  wife who does most of his wound care. He came in today for [place] to look at this. He has not had a history of wounds in his buttocks in spite of his paraplegia. 12/08/17; seen in follow-up today at his usual appointment. He was seen earlier this week and found to have a new wound on his buttock. We also follow him for wounds on the left lateral leg, left first second toe web space and right first second toe web space 12/15/17; we have been using Hydrofera Blue to the left lateral leg which has improved. The right first second toe web space has also improved. Left first second toe web space plantar aspect looks stable. The left buttock has worsened using Santyl. Apparently the buttock has drainage 12/22/17; we have been using Hydrofera Blue to the left lateral leg which continues to improve now 2 small wounds separated by normal skin. He  tells [place] he had a fever up to 100 yesterday he is prone to UTIs but has not noted anything different. He does in and out catheterizations. The area between the first and second toes today does not look good necrotic surface covered with what looks to be purulent drainage and erythema extending into the third toe. I had gotten this to something that I thought look better last time however it is not look good today. He also has a necrotic surface over the buttock wound which is expanded. I thought there might be infection under here so I removed a lot of the surface with a #5 curet though nothing look like it really needed culturing. He is been using Santyl to this area 12/27/17; his original wound on the left lateral leg continues to improve using Hydrofera Blue. I gave him samples of Baxdella although he was unable to take them out of fear for an allergic reaction ["lump in his throat"].the culture I did of the purulent drainage from his second toe last week showed both enterococcus and a set Enterobacter I was also concerned about the erythema on the bottom of his foot although paradoxically although this looks somewhat better today. 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 We've been using silver alginate here. They came up today that he sleeps in a recliner, I'm not sure why but I asked him to stop this 01/03/18; his original wound we've been using Hydrofera Blue is now separated into 2 areas. Ulcer on his left buttock is better he is off the recliner and sleeping in bed Finally both wound areas between his first and second toes also looks some better 01/10/18; his original wound on the left lateral leg is now separated into 2 wounds we've been using Hydrofera Blue Ulcer on his left buttock has some drainage. There is a small probing site going into muscle layer superiorly.using silver alginate -He  arrives today with a deep tissue injury on the left heel The wound on the dorsal aspect of his first second toe on the left looks a lot betterusing silver alginate ketoconazole The area on the first second toe web space on the right also looks a lot bette 01/17/18; his original wound on the left lateral leg continues to progress using Hydrofera Blue Ulcer on his left buttock also is smaller surface healthier except for a small probing site going into the muscle layer superiorly. 2.4 cm of tunneling in this area DTI on his left heel we have only  been offloading. Looks better than last week no threatened open no evidence of infection the wound on the dorsal aspect of the first second toe on the left continues to look like it's regressing we have only been using silver alginate and terbinafine orally The area in the first second toe web space on the right also looks to be a lot better using silver alginate and terbinafine I think this was prompted by tinea pedis 01/31/18; the patient was hospitalized in [place] last week apparently for a complicated UTI. He was discharged on cefepime he does in and out catheterizations. In the hospital he was discovered M I don't mild elevation of AST and ALTs and the terbinafine was stopped.predictably the pressure ulcer on his buttock looks betterusing silver alginate. The area on the left lateral leg also is better using Hydrofera Blue. The area between the first and second toes on the left better. First and second toes on the right still substantial but better. Finally the DTI on the left heel has held together and looks like it's resolving 02/07/18-he is here in follow-up evaluation for multiple ulcerations. He has new injury to the lateral aspect of the last issue a pressure ulcer, he states this is from adhesive removal trauma. He states he has tried multiple adhesive products with no success. All other ulcers appear stable. The left heel DTI is resolving. We  will continue with same treatment plan and follow-up next week. 02/14/18; follow-up for multiple areas. He has a new area last week on the lateral aspect of his pressure ulcer more over the posterior trochanter. The original pressure ulcer looks quite stable has healthy granulation. 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 Almost fully epithelialized on the original superior area using Hydrofera Blue DTI on the left heel has peeled off this week to reveal a small superficial wound under denuded skin and subcutaneous tissue Both areas between the first and second toes look better including nothing open on the left 02/21/18; The patient's wounds on his left ischial tuberosity and posterior left greater trochanter actually looked better. He has a large area of irritation around the area which I think is contact dermatitis. I am doubtful that this is fungal His original wound on the left lateral calf continues to improve we have been using Hydrofera Blue There is no open area in the left first second toe web space although there is a lot of thick callus The DTI on the left heel required debridement today of necrotic surface eschar and subcutaneous tissue using silver alginate Finally the area on the right first second toe webspace continues to contract using silver alginate and ketoconazole 02/28/18 Left ischial tuberosity wounds look better using silver alginate. Original wound on the left calf only has one small open area left using Hydrofera Blue DTI on the left heel required debridement mostly removing skin from around this wound surface. Using silver alginate The areas on the right first/second toe web space using silver alginate and ketoconazole 03/08/18 on evaluation today patient appears to be doing decently well as best I can tell in regard to his wounds. This is the first time that I  have seen him as he generally is followed by Dr. Dellia Nims. With that being said none of his wounds appear to be infected he does have an area where there is some skin covering what appears to be a new wound on the  left dorsal surface of his great toe. This is right at the nail bed. With that being said I do believe that debrided away some of the excess skin can be of benefit in this regard. Otherwise he has been tolerating the dressing changes without complication. 03/14/18; patient arrives today with the multiplicity of wounds that we are following. He has not been systemically unwell Original wound on the left lateral calf now only has 2 small open areas we've been using Hydrofera Blue which should continue The deep tissue injury on the left heel requires debridement today. We've been using silver alginate The left first second toe and the right first second toe are both are reminiscence what I think was tinea pedis. Apparently some of the callus Surface between the toes was removed last week when it started draining. Purulent drainage coming from the wound on the ischial tuberosity on the left. 03/21/18-He is here in follow-up evaluation for multiple wounds. 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 He tolerated debridement. The only change to last week's recommendations is to discontinue antifungal cream between toes. He will follow-up next week 03/28/18; following up for multiple wounds;Concern this week is streaking redness and swelling in the right foot. He is going to need antibiotics for this. 03/31/18; follow-up for right foot cellulitis. Streaking redness and swelling in the right foot on 03/28/18. 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 I put him on clindamycin 300 mg every 6 and brought him in for a quick check. He has an open wound between his first and second toes on the right foot as a  potential source. 04/04/18; Right foot cellulitis is resolving he is completing clindamycin. This is truly good news Left lateral calf wound which is initial wound only has one small open area inferiorly this is close to healing out. He has compression stockings. We will use Hydrofera Blue right down to the epithelialization of this Nonviable surface on the left heel which was initially pressure with a DTI. We've been using Hydrofera Blue. I'm going to switch this back to silver alginate Left first second toe/tinea pedis this looks better using silver alginate Right first second toe tinea pedis using silver alginate Large pressure ulcers on theLeft ischial tuberosity. Small wound here Looks better. I am uncertain about the surface over the large wound. Using silver alginate 04/11/18; Cellulitis in the right foot is resolved Left lateral calf wound which was his original wounds still has 2 tiny open areas remaining this is just about closed Nonviable surface on the left heel is better but still requires debridement Left first second toe/tinea pedis still open using silver alginate Right first second toe wound tinea pedis I asked him to go back to using ketoconazole and silver alginate Large pressure ulcers on the left ischial tuberosity this shear injury here is resolved. Wound is smaller. No evidence of infection using silver alginate 04/18/18; Patient arrives with an intense area of cellulitis in the right mid lower calf extending into the right heel area. Bright red and warm. Smaller area on the left anterior leg. He has a significant history of MRSA. He will definitely need antibioticsdoxycycline He now has 2 open areas on the left ischial tuberosity the original large wound and now a satellite area which I think was above his initial satellite areas. Not a wonderful surface on this satellite area surrounding erythema which looks like pressure related. His left lateral calf wound  again his  original wound is just about closed Left heel pressure injury still requiring debridement Left first second toe looks a lot better using silver alginate Right first second toe also using silver alginate and ketoconazole cream also looks better 04/20/18; the patient was worked in early today out of concerns with his cellulitis on the right leg. I had started him on doxycycline. This was 2 days ago. His wife was concerned about the swelling in the area. Also concerned about the left buttock. He has not been systemically unwell no fever chills. No nausea vomiting or diarrhea 04/25/18; the patient's left buttock wound is continued to deteriorate he is using Hydrofera Blue. He is still completing clindamycin for the cellulitis on the right leg although all of this looks better. 05/02/18 Left buttock wound still with a lot of drainage and a very tightly adherent fibrinous necrotic surface. He has a deeper area superiorly The left lateral calf wound is still closed DTI wound on the left heel necrotic surface especially the circumference using Iodoflex Areas between his left first second toe and right first second toe both look better. Dorsally and the right first second toe he had a necrotic surface although at smaller. In using silver alginate and ketoconazole. I did a culture last week which was a deep tissue culture of the reminiscence of the open wound on the right first second toe dorsally. This grew a few Acinetobacter and a few methicillin-resistant staph aureus. Nevertheless the area actually this week looked better. I didn't feel the need to specifically address this at least in terms of systemic antibiotics. 05/09/18; wounds are measuring larger more drainage per our intake. We are using Santyl covered with alginate on the large superficial buttock wounds, Iodosorb on the left heel, ketoconazole and silver alginate to the dorsal first and second toes bilaterally. 05/16/18; The area on his left  buttock better in some aspects although the area superiorly over the ischial tuberosity required an extensive debridement.using Santyl Left heel appears stable. Using Iodoflex The areas between his first and second toes are not bad however there is spreading erythema up the dorsal aspect of his left foot this looks like cellulitis again. He is insensate the erythema is really very brilliant.o Erysipelas He went to see an allergist days ago because he was itching part of this he had lab work done. This showed a white count of 15.1 with 70% neutrophils. Hemoglobin of 11.4 and a platelet count of 659,000. Last white count we had in Epic was a 2-1/2 years ago which was 25.9 but he was ill at the time. He was able to show me some lab work that was done by his primary physician the pattern is about the same. 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 But prompted me to go ahead and do x-rays of both feet and the pelvis rule out osteomyelitis. He also had a comprehensive metabolic panel this was reasonably normal his albumin was 3.7 liver function tests BUN/creatinine all normal 05/23/18; x-rays of both his feet from last week were negative for underlying pulmonary abnormality. The x-ray of his pelvis however showed mild irregularity in the left ischial which may represent some early osteomyelitis. The wound in the left ischial continues to get deeper clearly now exposed muscle. Each week necrotic surface material over this area. Whereas the rest of the wounds do not look so bad. The left ischial wound we have been using Santyl and  calcium alginate To the left heel surface necrotic debris using Iodoflex The left lateral leg is still healed Areas on the left dorsal foot and the right dorsal foot are about the same. There is some inflammation on the left which might represent contact dermatitis, fungal dermatitis I am doubtful cellulitis although this looks  better than last week 05/30/18; CT scan done at Hospital did not show any osteomyelitis or abscess. Suggested the possibility of underlying cellulitis although I don't see a lot of evidence of this at the bedside The wound itself on the left buttock/upper thigh actually looks somewhat better. No debridement Left heel also looks better no debridement continue Iodoflex Both dorsal first second toe spaces appear better using Lotrisone. Left still required debridement 06/06/18; Intake reported some purulent looking drainage from the left gluteal wound. Using Santyl and calcium alginate Left heel looks better although still a nonviable surface requiring debridement The left dorsal foot first/second webspace actually expanding and somewhat deeper. I may consider doing a shave biopsy of this area Right dorsal foot first/second webspace appears stable to improved. Using Lotrisone and silver alginate to both these areas 06/13/18 Left gluteal surface looks better. Now separated in the 2 wounds. No debridement required. Still drainage. We'll continue silver alginate Left heel continues to look better with Iodoflex continue this for at least another week Of his dorsal foot wounds the area on the left still has some depth although it looks better than last week. We've been using Lotrisone and silver alginate 06/20/18 Left gluteal continues to look better healthy tissue Left heel continues to look better healthy granulation wound is smaller. He is using Iodoflex and his long as this continues continue the Iodoflex Dorsal right foot looks better unfortunately dorsal left foot does not. There is swelling and erythema of his forefoot. He had minor trauma to this several days ago but doesn't think this was enough to have caused any tissue injury. Foot looks like cellulitis, we have had this problem before 06/27/18 on evaluation today patient appears to be doing a little worse in regard to his foot ulcer.  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 Both of which would really be his only options for treating this infection. In the past he has been given and effusion of Orbactiv. This is done very well for him in the past again it's one time dosing IV antibiotic therapy. Subsequently I do believe this is something we're gonna need to see about doing at this point in time. Currently his other wounds seem to be doing somewhat better in my pinion I'm pretty happy in that regard. 07/03/18 on evaluation today patient's wounds actually appear to be doing fairly well. He has been tolerating the dressing changes without complication. All in all he seems to be showing signs of improvement. In regard to the antibiotics he has been dealing with infectious disease since I saw him last week as far as getting this scheduled. In the end [facility]'s [facility] this coming Friday. In the meantime he has been continuing to perform the dressing changes in such as previous. There does not appear to be any evidence of infection worsengin at this time. 07/10/18; Since I last saw this man 2 weeks ago things have actually improved. IV antibiotics of resulted in less  forefoot erythema although there is still some present. He is not systemically unwell Left buttock wounds 2 now have no depth there is increased epithelialization Using silver alginate Left heel still requires debridement using Iodoflex Left dorsal foot still with a sizable wound about the size of a border but healthy granulation Right dorsal foot still with a slitlike area using silver alginate 07/18/18; the patient's cellulitis in the left foot is improved in fact I think it is on its way to resolving. Left buttock wounds 2 both look better although the larger one has  hypertension granulation we've been using silver alginate Left heel has some thick circumferential redundant skin over the wound edge which will need to be removed today we've been using Iodoflex Left dorsal foot is still a sizable wound required debridement using silver alginate The right dorsal foot is just about closed only a small open area remains here 07/25/18; left foot cellulitis is resolved Left buttock wounds 2 both look better. Hyper-granulation on the major area Left heel as some debris over the surface but otherwise looks a healthier wound. Using silver collagen Right dorsal foot is just about closed 07/31/18; arrives with our intake nurse worried about purulent drainage from the buttock. We had hyper-granulation here last week His buttock wounds 2 continue to look better Left heel some debris over the surface but measuring smaller. Right dorsal foot unfortunately has openings between the toes Left foot superficial wound looks less aggravated. 08/07/18 Buttock wounds continue to look better although some of her granulation and the larger medial wound. silver alginate Left heel continues to look a lot better.silver collagen Left foot superficial wound looks less stable. Requires debridement. He has a new wound superficial area on the foot on the lateral dorsal foot. Right foot looks better using silver alginate without Lotrisone 08/14/2018; patient was in the ER last week diagnosed with a UTI. He is now on Cefpodoxime and Macrodantin. Buttock wounds continued to be smaller. Using silver alginate Left heel continues to look better using silver collagen Left foot superficial wound looks as though it is improving Right dorsal foot area is just about healed. 08/21/2018; patient is completed his antibiotics for his UTI. He has 2 open areas on the buttocks. There is still not closed although the surface looks satisfactory. Using silver alginate Left heel continues to improve using  silver collagen The bilateral dorsal foot areas which are at the base of his first and second toes/possible tinea pedis are actually stable on the left but worse on the right. The area on the left required debridement of necrotic surface. After debridement I obtained a specimen for PCR culture. The right dorsal foot which is been just about healed last week is now reopened 08/28/2018; culture done on the left dorsal foot showed coag negative staph both staph epidermidis and Lugdunensis. I think this is worthwhile initiating systemic treatment. I will use doxycycline given his long list of allergies. The area on the left heel slightly improved but still requiring debridement. The large wound on the buttock is just about closed whereas the smaller one is larger. Using silver alginate in this area 09/04/2018; patient is completing his doxycycline for the left foot although this continues to be a very difficult wound area with very adherent necrotic debris. We are using silver alginate to all his wounds right foot left foot and the small wounds on his buttock, silver collagen on the left heel. 09/11/2018; once again this patient has intense erythema and swelling of the  left forefoot. Lesser degrees of erythema in the right foot. He has a long list of allergies and intolerances. I will reinstitute doxycycline. 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 Using silver alginate Left heel also looks better using silver collagen Unfortunately both the areas on his feet look worse. The area on the left first second webspace is now gone through to the plantar part of his foot. The area on the left foot anteriorly is irritated with erythema and swelling in the forefoot. 09/25/2018 His wound on the left plantar heel looks better. Using silver collagen The area on the left buttock 2 small remnant areas. One is closed one is still open. Using silver alginate The  areas between both his first and second toes look worse. This in spite of long-standing antifungal therapy with ketoconazole and silver alginate which should have antifungal activity He has small areas around his original wound on the left calf one is on the bottom of the original scar tissue and one superiorly both of these are small and superficial but again given wound history in this site this is worrisome 10/02/2018 Left plantar heel continues to gradually contract using silver collagen Left buttock wound is unchanged using silver alginate The areas on his dorsal feet between his first and second toes bilaterally look about the same. I prescribed clindamycin ointment to see if we can address chronic staph colonization and also the underlying possibility of erythrasma The left lateral lower extremity wound is actually on the lateral part of his ankle. Small open area here. We have been using silver alginate 10/09/2018; Left plantar heel continues to look healthy and contract. No debridement is required Left buttock slightly smaller with a tape injury wound just below which was new this week Dorsal feet somewhat improved I have been using clindamycin Left lateral looks lower extremity the actual open area looks worse although a lot of this is epithelialized. I am going to change to silver collagen today He has a lot more swelling in the right leg although this is not pitting not red and not particularly warm there is a lot of spasm in the right leg usually indicative of people with paralysis of some underlying discomfort. We have reviewed his vascular status from 2017 he had a left greater saphenous vein ablation. I wonder about referring him back to vascular surgery if the area on the left leg continues to deteriorate. 10/16/2018 in today for follow-up and management of multiple lower extremity ulcers. His left Buttock wound is much lower smaller and almost closed completely. The wound to  the left ankle has began to reopen with Epithelialization and some adherent slough. He has multiple new areas to the left foot and leg. The left dorsal foot without much improvement. Wound present between left great webspace and 2nd toe. Erythema and edema present right leg. Right LE ultrasound obtained on 10/10/18 was negative for DVT. 10/23/2018; Left buttock is closed over. Still dry macerated skin but there is no open wound. I suspect this is chronic pressure/moisture Left lateral calf is quite a bit worse than when I saw this last. There is clearly drainage here he has macerated skin into the left plantar heel. We will change the primary dressing to alginate Left dorsal foot has some improvement in overall wound area. Still using clindamycin and silver alginate Right dorsal foot about the same as the left using clindamycin and silver alginate The erythema in the  right leg has resolved. He is DVT rule out was negative Left heel pressure area required debridement although the wound is smaller and the surface is health 10/26/2018 The patient came back in for his nurse check today predominantly because of the drainage coming out of the left lateral leg with a recent reopening of his original wound on the left lateral calf. He comes in today with a large amount of surrounding erythema around the wound extending from the calf into the ankle and even in the area on the dorsal foot. He is not systemically unwell. He is not febrile. Nevertheless this looks like cellulitis. We have been using silver alginate to the area. I changed him to a regular visit and I am going to prescribe him doxycycline. The rationale here is a long list of medication intolerances and a history of MRSA. I did not see anything that I thought would provide a valuable culture 10/30/2018 Follow-up from his appointment 4 days ago with really an extensive area of cellulitis in the left calf left lateral ankle and left dorsal foot.  I put him on doxycycline. He has a long list of medication allergies which are true allergy reactions. 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 In any case he is a lot better today. The erythema especially in the anterior and lateral left calf is better. He still has left ankle erythema. He also is complaining about increasing edema in the right leg we have only been using Kerlix Coban and he has been doing the wraps at home. Finally he has a spotty rash on the medial part of his upper left calf which looks like folliculitis or perhaps wrap occlusion type injury. Small superficial macules not pustules 11/06/18 patient arrives today with again a considerable degree of erythema around the wound on the left lateral calf extending into the dorsal ankle and dorsal foot. This is a lot worse than when I saw this last week. He is on doxycycline really with not a lot of improvement. He has not been systemically unwell Wounds on the; left heel actually looks improved. Original area on the left foot and proximity to the first and second toes looks about the same. He has superficial areas on the dorsal foot, anterior calf and then the reopening of his original wound on the left lateral calf which looks about the same The only area he has on the right is the dorsal webspace first and second which is smaller. He has a large area of dry erythematous skin on the left buttock small open area here. 11/13/2018; the patient arrives in much better condition. The erythema around the wound on the left lateral calf is a lot better. Not sure whether this was the clindamycin or the TCA and ketoconazole or just in the improvement in edema control [stasis dermatitis]. In any case this is a lot better. The area on the left heel is very small and just about resolved using silver collagen we have been using silver alginate to the areas on his dorsal  feet 11/20/2018; his wounds include the left lateral calf, left heel, dorsal aspects of both feet just proximal to the first second webspace. He is stable to slightly improved. I did not think any changes to his dressings were going to be necessary 11/27/2018 he has a reopening on the left buttock which is surrounded by what looks like tinea or perhaps some other form of dermatitis. The  area on the left dorsal foot has some erythema around it I have marked this area but I am not sure whether this is cellulitis or not. Left heel is not closed. Left calf the reopening is really slightly longer and probably worse 1/13; in general things look better and smaller except for the left dorsal foot. Area on the left heel is just about closed, left buttock looks better only a small wound remains in the skin looks better [using Lotrisone] 1/20; the area on the left heel only has a few remaining open areas here. Left lateral calf about the same in terms of size, left dorsal foot slightly larger right lateral foot still not closed. The area on the left buttock has no open wound and the surrounding skin looks a lot better 1/27; the area on the left heel is closed. Left lateral calf better but still requiring extensive debridements. The area on his left buttock is closed. He still has the open areas on the left dorsal foot which is slightly smaller in the right foot which is slightly expanded. We have been using Iodoflex on these areas as well 2/3; left heel is closed. Left lateral calf still requiring debridement using Iodoflex there is no open area on his left buttock however he has dry scaly skin over a large area of this. Not really responding well to the Lotrisone. Finally the areas on his dorsal feet at the level of the first second webspace are slightly smaller on the right and about the same on the left. Both of these vigorously debrided with Anasept and gauze 2/10; left heel remains closed he has dry  erythematous skin over the left buttock but there is no open wound here. Left lateral leg has come in and with. Still requiring debridement we have been using Iodoflex here. Finally the area on the left dorsal foot and right dorsal foot are really about the same extremely dry callused fissured areas. He does not yet have a dermatology appointment 2/17; left heel remains closed. He has a new open area on the left buttock. The area on the left lateral calf is bigger longer and still covered in necrotic debris. No major change in his foot areas bilaterally. I am awaiting for a dermatologist to look on this. We have been using ketoconazole I do not know that this is been doing any good at all. 2/24; left heel remains closed. The left buttock wound that was new reopening last week looks better. The left lateral calf appears better also although still requires debridement. The major area on his foot is the left first second also requiring debridement. We have been putting Prisma on all wounds. I do not believe that the ketoconazole has done too much good for his feet. He will use Lotrisone I am going to give him a 2-week course of terbinafine. We still do not have a dermatology appointment 3/2 left heel remains closed however there is skin over bone in this area I pointed this out to him today. The left buttock wound is epithelialized but still does not look completely stable. The area on the left leg required debridement were using silver collagen here. With regards to his feet we changed to Lotrisone last week and silver alginate. 3/9; left heel remains closed. Left buttock remains closed. The area on the right foot is essentially closed. The left foot remains unchanged. Slightly smaller on the left lateral calf. Using silver collagen to both of these areas 3/16-Left heel remains closed.  Area on right foot is closed. Left lateral calf above the lateral malleolus open wound requiring debridement with  easy bleeding. Left dorsal wound proximal to first toe also debrided. Left ischial area open new. Patient has been using Prisma with wrapping every 3 days. Dermatology appointment is apparently tomorrow.Patient has completed his terbinafine 2-week course with some apparent improvement according to him, there is still flaking and dry skin in his foot on the left 3/23; area on the right foot is reopened. The area on the left anterior foot is about the same still a very necrotic adherent surface. He still has the area on the left leg and reopening is on the left buttock. He apparently saw dermatology although I do not have a note. According to the patient who is usually fairly well informed they did not have any good ideas. Put him on oral terbinafine which she is been on before. 3/30; using silver collagen to all wounds. Apparently his dermatologist put him on doxycycline and rifampin presumably some culture grew staph. I do not have this result. He remains on terbinafine although I have used terbinafine on him before 4/6; patient has had a fairly substantial reopening on the right foot between the first and second toes. He is finished his terbinafine and I believe is on doxycycline and rifampin still as prescribed by dermatology. We have been using silver collagen to all his wounds although the patient reports that he thinks silver alginate does better on the wounds on his buttock. 4/13; the area on his left lateral calf about the same size but it did not require debridement. Left dorsal foot just proximal to the webspace between the first and second toes is about the same. Still nonviable surface. I note some superficial bronze discoloration of the dorsal part of his foot Right dorsal foot just proximal to the first and second toes also looks about the same. I still think there may be the same discoloration I noted above on the left Left buttock wound looks about the same 4/20; left lateral calf  appears to be gradually contracting using silver collagen. 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 The left dorsal foot wound is debrided of tightly adherent necrotic debris and really cleans up quite nicely. The right area is worse with expansion. I did not debride this it is now over the base of the second toe The area on his left buttock is smaller no debridement is required using silver collagen 5/4; left calf continues to make good progress. He arrives with erythema around the wounds on his dorsal foot which even extends to the plantar aspect. Very concerning for coexistent infection. He is finished the erythromycin I gave him for possible erythrasma this does not seem to have helped. The area on the left foot is about the same base of the dorsal toes Is area on the buttock looks improved on the left 5/11; left calf and left buttock continued to make good progress. Left foot is about the same to slightly improved. Major problem is on the right foot. He has not had an x-ray. Deep tissue culture I did last week showed both Enterobacter and E. coli. I did not change the doxycycline I put him on empirically although neither 1 of these were plated to doxycycline. He arrives today with the erythema looking worse on both the dorsal and plantar foot. Macerated skin on the bottom of the foot. he has not been systemically unwell 5/18-Patient returns  at 1 week, left calf wound appears to be making some progress, left buttock wound appears slightly worse than last time, left foot wound looks slightly better, right foot redness is marginally better. X-ray of both feet show no air or evidence of osteomyelitis. Patient is finished his Omnicef and terbinafine. He continues to have macerated skin on the bottom of the left foot as well as right 5/26; left calf wound is better, left buttock wound appears to have multiple small superficial open areas  with surrounding macerated skin. X-rays that I did last time showed no evidence of osteomyelitis in either foot. He is finished cefdinir and doxycycline. I do not think that he was on terbinafine. He continues to have a large superficial open area on the right foot anterior dorsal and slightly between the first and second toes. I did send him to dermatology 2 months ago or so wondering about whether they would do a fungal scraping. I do not believe they did but did do a culture. We have been using silver alginate to the toe areas, he has been using antifungals at home topically either ketoconazole or Lotrisone. We are using silver collagen on the left foot, silver alginate on the right, silver collagen on the left lateral leg and silver alginate on the left buttock 6/1; left buttock area is healed. We have the left dorsal foot, left lateral leg and right dorsal foot. We are using silver alginate to the areas on both feet and silver collagen to the area on his left lateral calf 6/8; the left buttock apparently reopened late last week. He is not really sure how this happened. He is tolerating the terbinafine. Using silver alginate to all wounds 6/15; left buttock wound is larger than last week but still superficial. Came in the clinic today with a report of purulence from the left lateral leg I did not identify any infection Both areas on his dorsal feet appear to be better. He is tolerating the terbinafine. Using silver alginate to all wounds 6/22; left buttock is about the same this week, left calf quite a bit better. His left foot is about the same however he comes in with erythema and warmth in the right forefoot once again. Culture that I gave him in the beginning of May showed Enterobacter and E. coli. I gave him doxycycline and things seem to improve although neither 1 of these organisms was specifically plated. 6/29; left buttock is larger and dry this week. Left lateral calf looks to me to  be improved. Left dorsal foot also somewhat improved right foot completely unchanged. The erythema on the right foot is still present. He is completing the Ceftin dinner that I gave him empirically [see discussion above.) 7/6 - All wounds look to be stable and perhaps improved, the left buttock wound is slightly smaller, per patient bleeds easily, completed ceftin, the right foot redness is less, he is on terbinafine 7/13; left buttock wound about the same perhaps slightly narrower. Area on the left lateral leg continues to narrow. Left dorsal foot slightly smaller right foot about the same. We are using silver alginate on the right foot and Hydrofera Blue to the areas on the left. Unna boot on the left 2 layer compression on the right 7/20; left buttock wound absolutely the same. Area on lateral leg continues to get better. Left dorsal foot require debridement as did the right no major change in the 7/27; left buttock wound the same size necrotic debris over the surface.  The area on the lateral leg is closed once again. His left foot looks better right foot about the same although there is some involvement now of the posterior first second toe area. He is still on terbinafine which I have given him for a month, not certain a centimeter major change 06/25/19-All wounds appear to be slightly improved according to report, left buttock wound looks clean, both foot wounds have minimal to no debris the right dorsal foot has minimal slough. We are using Hydrofera Blue to the left and silver alginate to the right foot and ischial wound. 8/10-Wounds all appear to be around the same, the right forefoot distal part has some redness which was not there before, however the wound looks clean and small. Ischial wound looks about the same with no changes 8/17; his wound on the left lateral calf which was his original chronic venous insufficiency wound remains closed. Since I last saw him the areas on the left  dorsal foot right dorsal foot generally appear better but require debridement. The area on his left initial tuberosity appears somewhat larger to me perhaps hyper granulated and bleeds very easily. We have been using Hydrofera Blue to the left dorsal foot and silver alginate to everything else 8/24; left lateral calf remains closed. The areas on his dorsal feet on the webspace of the first and second toes bilaterally both look better. The area on the left buttock which is the pressure ulcer stage II slightly smaller. I change the dressing to Hydrofera Blue to all areas 8/31; left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 the area on the dorsal right second toe is improved 08/30/16 patient developed a fever over the weekend and was seen in an urgent care. Felt to have a UTI and put on doxycycline. He has been since changed over the phone to [facility]. After we took off the wrap on his right leg today the leg is swollen warm and erythematous, probably more likely the source of the fever 09/06/16; have been using  collagen to the major left leg wound, silver alginate to the area on his anterior foot/toes 09/13/16; the areas on his anterior foot/toes on both sides appear to be virtually closed. Extensive wound on the left lateral leg perhaps slightly narrower but each visit still covered an adherent surface slough 09/16/16 patient was in for his usual Thursday nurse visit however the intake nurse noted significant erythema of his dorsal right foot. He is also running a low-grade fever and having increasing spasms in the right leg 09/20/16 here for cellulitis involving his right great toes and forefoot. This is a lot better. Still requiring debridement on his left lateral leg. Santyl direct says he needs prior authorization. Therefore his wife cannot change this at home 09/30/16; the patient's extensive area on the left lateral calf and ankle perhaps somewhat better. Using Santyl. The area on the left toes is healed and I think the area on his right dorsal foot is healed as well. There is no cellulitis or venous inflammation involving the right leg. He is going to need compression stockings here. 10/07/16; the patient's extensive wound on the left lateral calf and ankle does not measure any differently however there appears to be less adherent surface slough using Santyl and aggressive weekly debridements 10/21/16; no major change in the area on the left lateral calf. Still the same measurement still very difficult to debridement adherent slough and nonviable subcutaneous tissue. This is not really been helped by several weeks of Santyl. Previously for 2 weeks I used Iodoflex for a short period. A prolonged course of Hydrofera Blue didn't really help. I'm not sure why I only used 2 weeks of Iodoflex on this there is no evidence of surrounding infection. He has a small area on the right second toe which looks as though it's progressing towards closure 10/28/16; the wounds on his toes appear to be closed. No major  change in the left lateral leg wound although the surface looks somewhat better using Iodoflex. He has had previous arterial studies that were normal. He has had reflux studies and is status post ablation although I don't have any exact notes on which vein was ablated. I'll need to check the surgical record 11/04/16; he's had a reopening between the first and second toe on the left and right. No major change in the left lateral leg wound. There is what appears to be cellulitis of the left dorsal foot 11/18/16 the patient was hospitalized initially in [place] and then subsequently transferred to [facility] long and was admitted there from 11/09/16 through 11/12/16. He had developed progressive cellulitis on the right leg in spite of the doxycycline I gave him. I'd spoken to the hospitalist in [place] who was concerned about continuing leukocytosis. CT scan is what I suggested this was done which showed soft tissue swelling without evidence of osteomyelitis or an underlying  abscess blood cultures were negative. At [facility] he was treated with vancomycin and Primaxin and then add an infectious disease consult. He was transitioned to Ceftaroline. He has been making progressive improvement. Overall a severe cellulitis of the right leg. He is been using silver alginate to her original wound on the left leg. The wounds in his toes on the right are closed there is a small open area on the base of the left second toe 11/26/15; the patient's right leg is much better although there is still some edema here this could be reminiscent from his severe cellulitis likely on top of some degree of lymphedema. His left anterior leg wound has less surface slough as reported by her intake nurse. Small wound at the base of the left second toe 12/02/16; patient's right leg is better and there is no open wound here. His left anterior lateral leg wound continues to have a healthy-looking surface. Small wound at the base of the  left second toe however there is erythema in the left forefoot which is worrisome 12/16/16; is no open wounds on his right leg. We took measurements for stockings. His left anterior lateral leg wound continues to have a healthy-looking surface. I'm not sure where we were with the Apligraf run through his insurance. We have been using Iodoflex. He has a thick eschar on the left first second toe interface, I suspect this may be fungal however there is no visible open 12/23/16; no open wound on his right leg. He has 2 small areas left of the linear wound that was remaining last week. We have been using Prisma, I thought I have disclosed this week, we can only look forward to next week 01/03/17; the patient had concerning areas of erythema last week, already on doxycycline for UTI through his primary doctor. The erythema is absolutely no better there is warmth and swelling both medially from the left lateral leg wound and also the dorsal left foot. 01/06/17- Patient is here for follow-up evaluation of his left lateral leg ulcer and bilateral feet ulcers. He is on oral antibiotic therapy, tolerating that. Nursing staff and the patient states that the erythema is improved from Monday. 01/13/17; the predominant left lateral leg wound continues to be problematic. I had put Apligraf on him earlier this month once. However he subsequently developed what appeared to be an intense cellulitis around the left lateral leg wound. I gave him Dalvance I think on 2/12 perhaps 2/13 he continues on cefdinir. The erythema is still present but the warmth and swelling is improved. I am hopeful that the cellulitis part of this control. I wouldn't be surprised if there is an element of venous inflammation as well. 01/17/17. The erythema is present but better in the left leg. His left lateral leg wound still does not have a viable surface buttons certain parts of this long thin wound it appears like there has been improvement in  dimensions. 01/20/17; the erythema still present but much better in the left leg. I'm thinking this is his usual degree of chronic venous inflammation. The wound on the left leg looks somewhat better. Is less surface slough 01/27/17; erythema is back to the chronic venous inflammation. The wound on the left leg is somewhat better. I am back to the point where I like to try an Apligraf once again 02/10/17; slight improvement in wound dimensions. Apligraf #2. He is completing his doxycycline 02/14/17; patient arrives today having completed doxycycline last Thursday. This was supposed to be  a nurse visit however once again he hasn't tense erythema from the medial part of his wound extending over the lower leg. Also erythema in his foot this is roughly in the same distribution as last time. He has baseline chronic venous inflammation however this is a lot worse than the baseline I have learned to accept the on him is baseline inflammation 02/24/17- patient is here for follow-up evaluation. He is tolerating compression therapy. His voicing no complaints or concerns he is here anticipating an Apligraf 03/03/17; he arrives today with an adherent necrotic surface. I don't think this is surface is going to be amenable for Apligraf's. The erythema around his wound and on the left dorsal foot has resolved he is off antibiotics 03/10/17; better-looking surface today. I don't think he can tolerate Apligraf's. He tells me he had a wound VAC after a skin graft years ago to this area and they had difficulty with a seal. The erythema continues to be stable around this some degree of chronic venous inflammation but he also has recurrent cellulitis. We have been using Iodoflex 03/17/17; continued improvement in the surface and may be small changes in dimensions. Using Iodoflex which seems the only thing that will control his surface 03/24/17- He is here for follow up evaluation of his LLE lateral ulceration and ulcer to right  dorsal foot/toe space. He is voicing no complaints or concerns, He is tolerating compression wrap. 03/31/17 arrives today with a much healthier looking wound on the left lower extremity. We have been using Iodoflex for a prolonged period of time which has for the first time prepared and adequate looking wound bed although we have not had much in the way of wound dimension improvement. He also has a small wound between the first and second toe on the right 04/07/17; arrives today with a healthy-looking wound bed and at least the top 50% of this wound appears to be now her. No debridement was required I have changed him to [facility] last week after prolonged Iodoflex. He did not do well with Apligraf's. We've had a re-opening between the first and second toe on the right 04/14/17; arrives today with a healthier looking wound bed contractions and the top 50% of this wound and some on the lesser 50%. Wound bed appears healthy. The area between the first and second toe on the right still remains problematic 04/21/17; continued very gradual improvement. Using [facility] 04/28/17; continued very gradual improvement in the left lateral leg venous insufficiency wound. His periwound erythema is very mild. We have been using Hydrofera Blue. Wound is making progress especially in the superior 50% 05/05/17; he continues to have very gradual improvement in the left lateral venous insufficiency wound. Both in terms with an length rings are improving. I debrided this every 2 weeks with #5 curet and we have been using Hydrofera Blue and again making good progress With regards to the wounds between his right first and second toe which I thought might of been tinea pedis he is not making as much progress very dry scaly skin over the area. Also the area at the base of the left first and second toe in a similar condition 05/12/17; continued gradual improvement in the refractory left lateral venous insufficiency  wound on the left. Dimension smaller. Surface still requiring debridement using Hydrofera Blue 05/19/17; continued gradual improvement in the refractory left lateral venous ulceration. Careful inspection of the wound bed underlying rumination suggested some degree of epithelialization over the surface no debridement  indicated. Continue Hydrofera Blue difficult areas between his toes first and third on the left than first and second on the right. I'm going to change to silver alginate from silver collagen. Continue ketoconazole as I suspect underlying tinea pedis 05/26/17; left lateral leg venous insufficiency wound. We've been using Hydrofera Blue. I believe that there is expanding epithelialization over the surface of the wound albeit not coming from the wound circumference. This is a bit of an odd situation in which the epithelialization seems to be coming from the surface of the wound rather than in the exact circumference. There is still small open areas mostly along the lateral margin of the wound. ooHe has unchanged areas between the left first and second and the right first second toes which I been treating for tenia pedis 06/02/17; left lateral leg venous insufficiency wound. We have been using Hydrofera Blue. Somewhat smaller from the wound circumference. The surface of the wound remains a bit on it almost epithelialized sedation in appearance. I use an open curette today debridement in the surface of all of this especially the edges ooSmall open wounds remaining on the dorsal right first and second toe interspace and the plantar left first second toe and her face on the left 06/09/17; wound on the left lateral leg continues to be smaller but very gradual and very dry surface using Hydrofera Blue 06/16/17 requires weekly debridements now on the left lateral leg although this continues to contract. I changed to silver collagen last week because of dryness of the wound bed. Using Iodoflex to  the areas on his first and second toes/web space bilaterally 06/24/17; patient with history of paraplegia also chronic venous insufficiency with lymphedema. Has a very difficult wound on the left lateral leg. This has been gradually reducing in terms of with but comes in with a very dry adherent surface. High switch to silver collagen a week or so ago with hydrogel to keep the area moist. This is been refractory to multiple dressing attempts. He also has areas in his first and second toes bilaterally in the anterior and posterior web space. I had been using Iodoflex here after a prolonged course of silver alginate with ketoconazole was ineffective [question tinea pedis] 07/14/17; patient arrives today with a very difficult adherent material over his left lateral lower leg wound. He also has surrounding erythema and poorly controlled edema. He was switched his Santyl last visit which the nurses are applying once during his doctor visit and once on a nurse visit. He was also reduced to 2 layer compression I'm not exactly sure of the issue here. 07/21/17; better surface today after 1 week of Iodoflex. Significant cellulitis that we treated last week also better. [Doxycycline] 07/28/17 better surface today with now 2 weeks of Iodoflex. Significant cellulitis treated with doxycycline. He has now completed the doxycycline and he is back to his usual degree of chronic venous inflammation/stasis dermatitis. He reminds me he has had ablations surgery here 08/04/17; continued improvement with Iodoflex to the left lateral leg wound in terms of the surface of the wound although the dimensions are better. 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 Problematic areas on the plantar aspect of the first second toe web space on the left and the dorsal aspect of the first second toe web space on the right. At one point I felt these were probably  related to chronic fungal infections in treated him aggressively for this although  we have not made any improvement here. 08/11/17; left lateral leg. Surface continues to improve with the Iodoflex although we are not seeing much improvement in overall wound dimensions. Areas on his plantar left foot and right foot show no improvement. In fact the right foot looks somewhat worse 08/18/17; left lateral leg. We changed to [facility] Blue last week after a prolonged course of Iodoflex which helps get the surface better. It appears that the wound with is improved. Continue with difficult areas on the left dorsal first second and plantar first second on the right 09/01/17; patient arrives in clinic today having had a temperature of 103 yesterday. He was seen in the ER and [facility] The patient was concerned he could have cellulitis again in the right leg however they diagnosed him with a UTI and he is now on Keflex. He has a history of cellulitis which is been recurrent and difficult but this is been in the left leg, in the past 5 use doxycycline. He does in and out catheterizations at home which are risk factors for UTI 09/08/17; patient will be completing his Keflex this weekend. The erythema on the left leg is considerably better. He has a new wound today on the medial part of the right leg small superficial almost looks like a skin tear. He has worsening of the area on the right dorsal first and second toe. His major area on the left lateral leg is better. Using Hydrofera Blue on all areas 09/15/17; gradual reduction in width on the long wound in the left lateral leg. No debridement required. He also has wounds on the plantar aspect of his left first second toe web space and on the dorsal aspect of the right first second toe web space. 09/22/17; there continues to be very gradual improvements in the dimensions of the left lateral leg wound. He hasn't round erythematous spot with might be pressure on  his wheelchair. There is no evidence obviously of infection no purulence no warmth ooHe has a dry scaled area on the plantar aspect of the left first second toe ooImproved area on the dorsal right first second toe. 09/29/17; left lateral leg wound continues to improve in dimensions mostly with an is still a fairly long but increasingly narrow wound. ooHe has a dry scaled area on the plantar aspect of his left first second toe web space ooIncreasingly concerning area on the dorsal right first second toe. In fact I am concerned today about possible cellulitis around this wound. The areas extending up his second toe and although there is deformities here almost appears to abut on the nailbed. 10/06/17; left lateral leg wound continues to make very gradual progress. Tissue culture I did from the right first second toe dorsal foot last time grew MRSA and enterococcus which was vancomycin sensitive. This was not sensitive to clindamycin or doxycycline. He is allergic to Zyvox and sulfa we have therefore arrange for him to have dalvance infusion tomorrow. He is had this in the past and tolerated it well 10/20/17; left lateral leg wound continues to make decent progress. This is certainly reduced in terms of with there is advancing epithelialization.ooThe cellulitis in the right foot looks better although he still has a deep wound in the dorsal aspect of the first second toe web space. Plantar left first toe web space on the left I think is making some progress 10/27/17; left lateral leg wound continues to make decent progress. Advancing epithelialization.using Hydrofera Blue ooThe right first second toe web  space wound is better-looking using silver alginate ooImprovement in the left plantar first second toe web space. Again using silver alginate 11/03/17 left lateral leg wound continues to make decent progress albeit slowly. Using Hydrofera Blue ooThe right per second toe web space continues to  be a very problematic looking punched out wound. I obtained a piece of tissue for deep culture I did extensively treated this for fungus. It is difficult to imagine that this is a pressure area as the patient states other than going outside he doesn't really wear shoes at home ooThe left plantar first second toe web space looked fairly senescent. Necrotic edges. This required debridement oochange to Hydrofera Blue to all wound areas 11/10/17; left lateral leg wound continues to contract. Using Hydrofera Blue ooOn the right dorsal first second toe web space dorsally. Culture I did of this area last week grew MRSA there is not an easy oral option in this patient was multiple antibiotic allergies or intolerances. This was only a rare culture isolate I'm therefore going to use Bactroban under silver alginate ooOn the left plantar first second toe web space. Debridement is required here. This is also unchanged 11/17/17; left lateral leg wound continues to contract using Hydrofera Blue this is no longer the major issue. ooThe major concern here is the right first second toe web space. He now has an open area going from dorsally to the plantar aspect. There is now wound on the inner lateral part of the first toe. Not a very viable surface on this. There is erythema spreading medially into the forefoot. ooNo major change in the left first second toe plantar wound 11/24/17; left lateral leg wound continues to contract using Hydrofera Blue. Nice improvement today ooThe right first second toe web space all of this looks a lot less angry than last week. I have given him clindamycin and topical Bactroban for MRSA and terbinafine for the possibility of underlining tinea pedis that I could not control with ketoconazole. Looks somewhat better ooThe area on the plantar left first second toe web space is weeping with dried debris around the wound 12/01/17; left lateral leg wound continues to contract he  Hydrofera Blue. It is becoming thinner in terms of with nevertheless it is making good improvement. ooThe right first second toe web space looks less angry but still a large necrotic-looking wounds starting on the plantar aspect of the right foot extending between the toes and now extensively on the base of the right second toe. I gave him clindamycin and topical Bactroban for MRSA anterior benefiting for the possibility of underlying tinea pedis. Not looking better today ooThe area on the left first/second toe looks better. Debrided of necrotic debris 12/05/17* the patient was worked in urgently today because over the weekend he found blood on his incontinence bad when he woke up. He was found to have an ulcer by his wife who does most of his wound care. He came in today for [place] to look at this. He has not had a history of wounds in his buttocks in spite of his paraplegia. 12/08/17; seen in follow-up today at his usual appointment. He was seen earlier this week and found to have a new wound on his buttock. We also follow him for wounds on the left lateral leg, left first second toe web space and right first second toe web space 12/15/17; we have been using Hydrofera Blue to the left lateral leg which has improved. The right first second toe web space  has also improved. Left first second toe web space plantar aspect looks stable. The left buttock has worsened using Santyl. Apparently the buttock has drainage 12/22/17; we have been using Hydrofera Blue to the left lateral leg which continues to improve now 2 small wounds separated by normal skin. He tells [place] he had a fever up to 100 yesterday he is prone to UTIs but has not noted anything different. He does in and out catheterizations. The area between the first and second toes today does not look good necrotic surface covered with what looks to be purulent drainage and erythema extending into the third toe. I had gotten this to something that I  thought look better last time however it is not look good today. He also has a necrotic surface over the buttock wound which is expanded. I thought there might be infection under here so I removed a lot of the surface with a #5 curet though nothing look like it really needed culturing. He is been using Santyl to this area 12/27/17; his original wound on the left lateral leg continues to improve using Hydrofera Blue. I gave him samples of Baxdella although he was unable to take them out of fear for an allergic reaction ["lump in his throat"].the culture I did of the purulent drainage from his second toe last week showed both enterococcus and a set Enterobacter I was also concerned about the erythema on the bottom of his foot although paradoxically although this looks somewhat better today. 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 We've been using silver alginate here. They came up today that he sleeps in a recliner, I'm not sure why but I asked him to stop this 01/03/18; his original wound we've been using Hydrofera Blue is now separated into 2 areas. ooUlcer on his left buttock is better he is off the recliner and sleeping in bed ooFinally both wound areas between his first and second toes also looks some better 01/10/18; his original wound on the left lateral leg is now separated into 2 wounds we've been using Hydrofera Blue ooUlcer on his left buttock has some drainage. There is a small probing site going into muscle layer superiorly.using silver alginate -He arrives today with a deep tissue injury on the left heel ooThe wound on the dorsal aspect of his first second toe on the left looks a lot betterusing silver alginate ketoconazole ooThe area on the first second toe web space on the right also looks a lot bette 01/17/18; his original wound on the left lateral leg continues to progress using  Hydrofera Blue ooUlcer on his left buttock also is smaller surface healthier except for a small probing site going into the muscle layer superiorly. 2.4 cm of tunneling in this area ooDTI on his left heel we have only been offloading. Looks better than last week no threatened open no evidence of infection oothe wound on the dorsal aspect of the first second toe on the left continues to look like it's regressing we have only been using silver alginate and terbinafine orally ooThe area in the first second toe web space on the right also looks to be a lot better using silver alginate and terbinafine I think this was prompted by tinea pedis 01/31/18; the patient was hospitalized in [place] last week apparently for a complicated UTI. He was discharged on cefepime he does in and out catheterizations. In the hospital he  was discovered M I don't mild elevation of AST and ALTs and the terbinafine was stopped.predictably the pressure ulcer on his buttock looks betterusing silver alginate. The area on the left lateral leg also is better using Hydrofera Blue. The area between the first and second toes on the left better. First and second toes on the right still substantial but better. Finally the DTI on the left heel has held together and looks like it's resolving 02/07/18-he is here in follow-up evaluation for multiple ulcerations. He has new injury to the lateral aspect of the last issue a pressure ulcer, he states this is from adhesive removal trauma. He states he has tried multiple adhesive products with no success. All other ulcers appear stable. The left heel DTI is resolving. We will continue with same treatment plan and follow-up next week. 02/14/18; follow-up for multiple areas. ooHe has a new area last week on the lateral aspect of his pressure ulcer more over the posterior trochanter. The original pressure ulcer looks quite stable has healthy granulation. 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 Almost fully epithelialized on the original superior area using Hydrofera Blue ooDTI on the left heel has peeled off this week to reveal a small superficial wound under denuded skin and subcutaneous tissue ooBoth areas between the first and second toes look better including nothing open on the left 02/21/18; ooThe patient's wounds on his left ischial tuberosity and posterior left greater trochanter actually looked better. He has a large area of irritation around the area which I think is contact dermatitis. I am doubtful that this is fungal ooHis original wound on the left lateral calf continues to improve we have been using Hydrofera Blue ooThere is no open area in the left first second toe web space although there is a lot of thick callus ooThe DTI on the left heel required debridement today of necrotic surface eschar and subcutaneous tissue using silver alginate ooFinally the area on the right first second toe webspace continues to contract using silver alginate and ketoconazole 02/28/18 ooLeft ischial tuberosity wounds look better using silver alginate. ooOriginal wound on the left calf only has one small open area left using Hydrofera Blue ooDTI on the left heel required debridement mostly removing skin from around this wound surface. Using silver alginate ooThe areas on the right first/second toe web space using silver alginate and ketoconazole 03/08/18 on evaluation today patient appears to be doing decently well as best I can tell in regard to his wounds. This is the first time that I have seen him as he generally is followed by Dr. Dellia Nims. With that being said none of his wounds appear to be infected he does have an area where there is some skin covering what appears to be a new wound on the left dorsal surface of his great toe. This is right at the nail bed. With  that being said I do believe that debrided away some of the excess skin can be of benefit in this regard. Otherwise he has been tolerating the dressing changes without complication. 03/14/18; patient arrives today with the multiplicity of wounds that we are following. He has not been systemically unwell ooOriginal wound on the left lateral calf now only has 2 small open areas we've been using Hydrofera Blue which should continue ooThe deep tissue injury on the left heel requires debridement today. We've been using silver alginate ooThe left  first second toe and the right first second toe are both are reminiscence what I think was tinea pedis. Apparently some of the callus Surface between the toes was removed last week when it started draining. ooPurulent drainage coming from the wound on the ischial tuberosity on the left. 03/21/18-He is here in follow-up evaluation for multiple wounds. 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 He tolerated debridement. The only change to last week's recommendations is to discontinue antifungal cream between toes. He will follow-up next week 03/28/18; following up for multiple wounds;Concern this week is streaking redness and swelling in the right foot. He is going to need antibiotics for this. 03/31/18; follow-up for right foot cellulitis. Streaking redness and swelling in the right foot on 03/28/18. 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 I put him on clindamycin 300 mg every 6 and brought him in for a quick check. He has an open wound between his first and second toes on the right foot as a potential source. 04/04/18; ooRight foot cellulitis is resolving he is completing clindamycin. This is truly good news ooLeft lateral calf wound which is initial wound only has one small open area inferiorly this is close to healing out. He has compression stockings. We will  use Hydrofera Blue right down to the epithelialization of this ooNonviable surface on the left heel which was initially pressure with a DTI. We've been using Hydrofera Blue. I'm going to switch this back to silver alginate ooLeft first second toe/tinea pedis this looks better using silver alginate ooRight first second toe tinea pedis using silver alginate ooLarge pressure ulcers on theLeft ischial tuberosity. Small wound here Looks better. I am uncertain about the surface over the large wound. Using silver alginate 04/11/18; ooCellulitis in the right foot is resolved ooLeft lateral calf wound which was his original wounds still has 2 tiny open areas remaining this is just about closed ooNonviable surface on the left heel is better but still requires debridement ooLeft first second toe/tinea pedis still open using silver alginate ooRight first second toe wound tinea pedis I asked him to go back to using ketoconazole and silver alginate ooLarge pressure ulcers on the left ischial tuberosity this shear injury here is resolved. Wound is smaller. No evidence of infection using silver alginate 04/18/18; ooPatient arrives with an intense area of cellulitis in the right mid lower calf extending into the right heel area. Bright red and warm. Smaller area on the left anterior leg. He has a significant history of MRSA. He will definitely need antibioticsoodoxycycline ooHe now has 2 open areas on the left ischial tuberosity the original large wound and now a satellite area which I think was above his initial satellite areas. Not a wonderful surface on this satellite area surrounding erythema which looks like pressure related. ooHis left lateral calf wound again his original wound is just about closed ooLeft heel pressure injury still requiring debridement ooLeft first second toe looks a lot better using silver alginate ooRight first second toe also using silver alginate and ketoconazole  cream also looks better 04/20/18; the patient was worked in early today out of concerns with his cellulitis on the right leg. I had started him on doxycycline. This was 2 days ago. His wife was concerned about the swelling in the area. Also concerned about the left buttock. He has not been systemically unwell no fever chills. No nausea vomiting or diarrhea 04/25/18; the patient's left buttock  wound is continued to deteriorate he is using Hydrofera Blue. He is still completing clindamycin for the cellulitis on the right leg although all of this looks better. 05/02/18 ooLeft buttock wound still with a lot of drainage and a very tightly adherent fibrinous necrotic surface. He has a deeper area superiorly ooThe left lateral calf wound is still closed ooDTI wound on the left heel necrotic surface especially the circumference using Iodoflex ooAreas between his left first second toe and right first second toe both look better. Dorsally and the right first second toe he had a necrotic surface although at smaller. In using silver alginate and ketoconazole. I did a culture last week which was a deep tissue culture of the reminiscence of the open wound on the right first second toe dorsally. This grew a few Acinetobacter and a few methicillin-resistant staph aureus. Nevertheless the area actually this week looked better. I didn't feel the need to specifically address this at least in terms of systemic antibiotics. 05/09/18; wounds are measuring larger more drainage per our intake. We are using Santyl covered with alginate on the large superficial buttock wounds, Iodosorb on the left heel, ketoconazole and silver alginate to the dorsal first and second toes bilaterally. 05/16/18; ooThe area on his left buttock better in some aspects although the area superiorly over the ischial tuberosity required an extensive debridement.using Santyl ooLeft heel appears stable. Using Iodoflex ooThe areas between his  first and second toes are not bad however there is spreading erythema up the dorsal aspect of his left foot this looks like cellulitis again. He is insensate the erythema is really very brilliant.o Erysipelas He went to see an allergist days ago because he was itching part of this he had lab work done. This showed a white count of 15.1 with 70% neutrophils. Hemoglobin of 11.4 and a platelet count of 659,000. Last white count we had in Epic was a 2-1/2 years ago which was 25.9 but he was ill at the time. He was able to show me some lab work that was done by his primary physician the pattern is about the same. 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 But prompted me to go ahead and do x-rays of both feet and the pelvis rule out osteomyelitis. He also had a comprehensive metabolic panel this was reasonably normal his albumin was 3.7 liver function tests BUN/creatinine all normal 05/23/18; x-rays of both his feet from last week were negative for underlying pulmonary abnormality. The x-ray of his pelvis however showed mild irregularity in the left ischial which may represent some early osteomyelitis. The wound in the left ischial continues to get deeper clearly now exposed muscle. Each week necrotic surface material over this area. Whereas the rest of the wounds do not look so bad. ooThe left ischial wound we have been using Santyl and calcium alginate ooTo the left heel surface necrotic debris using Iodoflex ooThe left lateral leg is still healed ooAreas on the left dorsal foot and the right dorsal foot are about the same. There is some inflammation on the left which might represent contact dermatitis, fungal dermatitis I am doubtful cellulitis although this looks better than last week 05/30/18; CT scan done at Hospital did not show any osteomyelitis or abscess. Suggested the possibility of underlying cellulitis although I don't see a lot of evidence of  this at the bedside ooThe wound itself on the left buttock/upper thigh actually looks somewhat better. No debridement   ooLeft heel also looks better no debridement continue Iodoflex ooBoth dorsal first second toe spaces appear better using Lotrisone. Left still required debridement 06/06/18; ooIntake reported some purulent looking drainage from the left gluteal wound. Using Santyl and calcium alginate ooLeft heel looks better although still a nonviable surface requiring debridement ooThe left dorsal foot first/second webspace actually expanding and somewhat deeper. I may consider doing a shave biopsy of this area ooRight dorsal foot first/second webspace appears stable to improved. Using Lotrisone and silver alginate to both these areas 06/13/18 ooLeft gluteal surface looks better. Now separated in the 2 wounds. No debridement required. Still drainage. We'll continue silver alginate ooLeft heel continues to look better with Iodoflex continue this for at least another week ooOf his dorsal foot wounds the area on the left still has some depth although it looks better than last week. We've been using Lotrisone and silver alginate 06/20/18 ooLeft gluteal continues to look better healthy tissue ooLeft heel continues to look better healthy granulation wound is smaller. He is using Iodoflex and his long as this continues continue the Iodoflex ooDorsal right foot looks better unfortunately dorsal left foot does not. There is swelling and erythema of his forefoot. He had minor trauma to this several days ago but doesn't think this was enough to have caused any tissue injury. Foot looks like cellulitis, we have had this problem before 06/27/18 on evaluation today patient appears to be doing a little worse in regard to his foot ulcer. Unfortunately it does appear that he has methicillin-resistant staph aureus and unfortunately there really are no oral options for him as he's allergic to sulfa  drugs as well as I box. Both of which would really be his only options for treating this infection. In the past he has been given and effusion of Orbactiv. This is done very well for him in the past again it's one time dosing IV antibiotic therapy. Subsequently I do believe this is something we're gonna need to see about doing at this point in time. Currently his other wounds seem to be doing somewhat better in my pinion I'm pretty happy in that regard. 07/03/18 on evaluation today patient's wounds actually appear to be doing fairly well. He has been tolerating the dressing changes without complication. All in all he seems to be showing signs of improvement. In regard to the antibiotics he has been dealing with infectious disease since I saw him last week as far as getting this scheduled. In the end [facility]'s [facility] this coming Friday. In the meantime he has been continuing to perform the dressing changes in such as previous. There does not appear to be any evidence of infection worsengin at this time. 07/10/18; ooSince I last saw this man 2 weeks ago things have actually improved. IV antibiotics of resulted in less forefoot erythema although there is still some present. He is not systemically unwell ooLeft buttock wounds o2 now have no depth there is increased epithelialization Using silver alginate ooLeft heel still requires debridement using Iodoflex ooLeft dorsal foot still with a sizable wound about the size of a border but healthy granulation ooRight dorsal foot still with a slitlike area using silver alginate 07/18/18; the patient's cellulitis in the left foot is improved in fact I think it is on its way to resolving. ooLeft buttock wounds o2 both look better although the larger one has hypertension granulation we've been using silver alginate ooLeft heel  has some thick circumferential redundant skin over the wound edge which  will need to be removed today we've been using Iodoflex ooLeft dorsal foot is still a sizable wound required debridement using silver alginate ooThe right dorsal foot is just about closed only a small open area remains here 07/25/18; left foot cellulitis is resolved ooLeft buttock wounds o2 both look better. Hyper-granulation on the major area ooLeft heel as some debris over the surface but otherwise looks a healthier wound. Using silver collagen ooRight dorsal foot is just about closed 07/31/18; arrives with our intake nurse worried about purulent drainage from the buttock. We had hyper-granulation here last week ooHis buttock wounds o2 continue to look better ooLeft heel some debris over the surface but measuring smaller. ooRight dorsal foot unfortunately has openings between the toes ooLeft foot superficial wound looks less aggravated. 08/07/18 ooButtock wounds continue to look better although some of her granulation and the larger medial wound. silver alginate ooLeft heel continues to look a lot better.silver collagen ooLeft foot superficial wound looks less stable. Requires debridement. He has a new wound superficial area on the foot on the lateral dorsal foot. ooRight foot looks better using silver alginate without Lotrisone 08/14/2018; patient was in the ER last week diagnosed with a UTI. He is now on Cefpodoxime and Macrodantin. ooButtock wounds continued to be smaller. Using silver alginate ooLeft heel continues to look better using silver collagen ooLeft foot superficial wound looks as though it is improving ooRight dorsal foot area is just about healed. 08/21/2018; patient is completed his antibiotics for his UTI. ooHe has 2 open areas on the buttocks. There is still not closed although the surface looks satisfactory. Using silver alginate ooLeft heel continues to improve using silver collagen ooThe bilateral dorsal foot areas which are at the base of his first  and second toes/possible tinea pedis are actually stable on the left but worse on the right. The area on the left required debridement of necrotic surface. After debridement I obtained a specimen for PCR culture. ooThe right dorsal foot which is been just about healed last week is now reopened 08/28/2018; culture done on the left dorsal foot showed coag negative staph both staph epidermidis and Lugdunensis. I think this is worthwhile initiating systemic treatment. I will use doxycycline given his long list of allergies. The area on the left heel slightly improved but still requiring debridement. ooThe large wound on the buttock is just about closed whereas the smaller one is larger. Using silver alginate in this area 09/04/2018; patient is completing his doxycycline for the left foot although this continues to be a very difficult wound area with very adherent necrotic debris. We are using silver alginate to all his wounds right foot left foot and the small wounds on his buttock, silver collagen on the left heel. 09/11/2018; once again this patient has intense erythema and swelling of the left forefoot. Lesser degrees of erythema in the right foot. He has a long list of allergies and intolerances. I will reinstitute doxycycline. 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 Using silver alginate ooLeft heel also looks better using silver collagen ooUnfortunately both the areas on his feet look worse. The area on the left first second webspace is now gone through to the plantar part of his foot. The area on the left foot anteriorly is irritated with erythema and swelling in the forefoot. 09/25/2018 ooHis wound on the left plantar heel looks  better. Using silver collagen ooThe area on the left buttock 2 small remnant areas. One is closed one is still open. Using silver alginate ooThe areas between both his first and second toes look worse. This  in spite of long-standing antifungal therapy with ketoconazole and silver alginate which should have antifungal activity ooHe has small areas around his original wound on the left calf one is on the bottom of the original scar tissue and one superiorly both of these are small and superficial but again given wound history in this site this is worrisome 10/02/2018 ooLeft plantar heel continues to gradually contract using silver collagen ooLeft buttock wound is unchanged using silver alginate ooThe areas on his dorsal feet between his first and second toes bilaterally look about the same. 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 Small open area here. We have been using silver alginate 10/09/2018; ooLeft plantar heel continues to look healthy and contract. No debridement is required ooLeft buttock slightly smaller with a tape injury wound just below which was new this week ooDorsal feet somewhat improved I have been using clindamycin ooLeft lateral looks lower extremity the actual open area looks worse although a lot of this is epithelialized. I am going to change to silver collagen today He has a lot more swelling in the right leg although this is not pitting not red and not particularly warm there is a lot of spasm in the right leg usually indicative of people with paralysis of some underlying discomfort. We have reviewed his vascular status from 2017 he had a left greater saphenous vein ablation. I wonder about referring him back to vascular surgery if the area on the left leg continues to deteriorate. 10/16/2018 in today for follow-up and management of multiple lower extremity ulcers. His left Buttock wound is much lower smaller and almost closed completely. The wound to the left ankle has began to reopen  with Epithelialization and some adherent slough. He has multiple new areas to the left foot and leg. The left dorsal foot without much improvement. Wound present between left great webspace and 2nd toe. Erythema and edema present right leg. Right LE ultrasound obtained on 10/10/18 was negative for DVT. 10/23/2018; ooLeft buttock is closed over. Still dry macerated skin but there is no open wound. I suspect this is chronic pressure/moisture ooLeft lateral calf is quite a bit worse than when I saw this last. There is clearly drainage here he has macerated skin into the left plantar heel. We will change the primary dressing to alginate ooLeft dorsal foot has some improvement in overall wound area. Still using clindamycin and silver alginate ooRight dorsal foot about the same as the left using clindamycin and silver alginate ooThe erythema in the right leg has resolved. He is DVT rule out was negative ooLeft heel pressure area required debridement although the wound is smaller and the surface is health 10/26/2018 ooThe patient came back in for his nurse check today predominantly because of the drainage coming out of the left lateral leg with a recent reopening of his original wound on the left lateral calf. He comes in today with a large amount of surrounding erythema around the wound extending from the calf into the ankle and even in the area on the dorsal foot. He is not systemically unwell. He is not febrile. Nevertheless this looks  like cellulitis. We have been using silver alginate to the area. I changed him to a regular visit and I am going to prescribe him doxycycline. The rationale here is a long list of medication intolerances and a history of MRSA. I did not see anything that I thought would provide a valuable culture 10/30/2018 ooFollow-up from his appointment 4 days ago with really an extensive area of cellulitis in the left calf left lateral ankle and left dorsal foot. I put him  on doxycycline. He has a long list of medication allergies which are true allergy reactions. 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 In any case he is a lot better today. The erythema especially in the anterior and lateral left calf is better. He still has left ankle erythema. He also is complaining about increasing edema in the right leg we have only been using Kerlix Coban and he has been doing the wraps at home. Finally he has a spotty rash on the medial part of his upper left calf which looks like folliculitis or perhaps wrap occlusion type injury. Small superficial macules not pustules 11/06/18 patient arrives today with again a considerable degree of erythema around the wound on the left lateral calf extending into the dorsal ankle and dorsal foot. This is a lot worse than when I saw this last week. He is on doxycycline really with not a lot of improvement. He has not been systemically unwell Wounds on the; left heel actually looks improved. Original area on the left foot and proximity to the first and second toes looks about the same. He has superficial areas on the dorsal foot, anterior calf and then the reopening of his original wound on the left lateral calf which looks about the same ooThe only area he has on the right is the dorsal webspace first and second which is smaller. ooHe has a large area of dry erythematous skin on the left buttock small open area here. 11/13/2018; the patient arrives in much better condition. The erythema around the wound on the left lateral calf is a lot better. Not sure whether this was the clindamycin or the TCA and ketoconazole or just in the improvement in edema control [stasis dermatitis]. In any case this is a lot better. The area on the left heel is very small and just about resolved using silver collagen we have been using silver alginate to the areas on his dorsal  feet 11/20/2018; his wounds include the left lateral calf, left heel, dorsal aspects of both feet just proximal to the first second webspace. He is stable to slightly improved. I did not think any changes to his dressings were going to be necessary 11/27/2018 he has a reopening on the left buttock which is surrounded by what looks like tinea or perhaps some other form of dermatitis. The area on the left dorsal foot has some erythema around it I have marked this area but I am not sure whether this is cellulitis or not. Left heel is not closed. Left calf the reopening is really slightly longer and probably worse 1/13; in general things look better and smaller except for the left dorsal foot. Area on the left heel is just about closed, left buttock looks better only a small wound remains in the skin looks better [using Lotrisone] 1/20; the area on the left heel only has a few remaining open areas here. Left lateral calf about the same in terms  of size, left dorsal foot slightly larger right lateral foot still not closed. The area on the left buttock has no open wound and the surrounding skin looks a lot better 1/27; the area on the left heel is closed. Left lateral calf better but still requiring extensive debridements. The area on his left buttock is closed. He still has the open areas on the left dorsal foot which is slightly smaller in the right foot which is slightly expanded. We have been using Iodoflex on these areas as well 2/3; left heel is closed. Left lateral calf still requiring debridement using Iodoflex there is no open area on his left buttock however he has dry scaly skin over a large area of this. Not really responding well to the Lotrisone. Finally the areas on his dorsal feet at the level of the first second webspace are slightly smaller on the right and about the same on the left. Both of these vigorously debrided with Anasept and gauze 2/10; left heel remains closed he has dry  erythematous skin over the left buttock but there is no open wound here. Left lateral leg has come in and with. Still requiring debridement we have been using Iodoflex here. Finally the area on the left dorsal foot and right dorsal foot are really about the same extremely dry callused fissured areas. He does not yet have a dermatology appointment 2/17; left heel remains closed. He has a new open area on the left buttock. The area on the left lateral calf is bigger longer and still covered in necrotic debris. No major change in his foot areas bilaterally. I am awaiting for a dermatologist to look on this. We have been using ketoconazole I do not know that this is been doing any good at all. 2/24; left heel remains closed. The left buttock wound that was new reopening last week looks better. The left lateral calf appears better also although still requires debridement. The major area on his foot is the left first second also requiring debridement. We have been putting Prisma on all wounds. I do not believe that the ketoconazole has done too much good for his feet. He will use Lotrisone I am going to give him a 2-week course of terbinafine. We still do not have a dermatology appointment 3/2 left heel remains closed however there is skin over bone in this area I pointed this out to him today. The left buttock wound is epithelialized but still does not look completely stable. The area on the left leg required debridement were using silver collagen here. With regards to his feet we changed to Lotrisone last week and silver alginate. 3/9; left heel remains closed. Left buttock remains closed. The area on the right foot is essentially closed. The left foot remains unchanged. Slightly smaller on the left lateral calf. Using silver collagen to both of these areas 3/16-Left heel remains closed. Area on right foot is closed. Left lateral calf above the lateral malleolus open wound requiring debridement with  easy bleeding. Left dorsal wound proximal to first toe also debrided. Left ischial area open new. Patient has been using Prisma with wrapping every 3 days. Dermatology appointment is apparently tomorrow.Patient has completed his terbinafine 2-week course with some apparent improvement according to him, there is still flaking and dry skin in his foot on the left 3/23; area on the right foot is reopened. The area on the left anterior foot is about the same still a very necrotic adherent surface. He still has the  area on the left leg and reopening is on the left buttock. He apparently saw dermatology although I do not have a note. According to the patient who is usually fairly well informed they did not have any good ideas. Put him on oral terbinafine which she is been on before. 3/30; using silver collagen to all wounds. Apparently his dermatologist put him on doxycycline and rifampin presumably some culture grew staph. I do not have this result. He remains on terbinafine although I have used terbinafine on him before 4/6; patient has had a fairly substantial reopening on the right foot between the first and second toes. He is finished his terbinafine and I believe is on doxycycline and rifampin still as prescribed by dermatology. We have been using silver collagen to all his wounds although the patient reports that he thinks silver alginate does better on the wounds on his buttock. 4/13; the area on his left lateral calf about the same size but it did not require debridement. ooLeft dorsal foot just proximal to the webspace between the first and second toes is about the same. Still nonviable surface. I note some superficial bronze discoloration of the dorsal part of his foot ooRight dorsal foot just proximal to the first and second toes also looks about the same. I still think there may be the same discoloration I noted above on the left ooLeft buttock wound looks about the same 4/20; left  lateral calf appears to be gradually contracting using silver collagen. 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 The left dorsal foot wound is debrided of tightly adherent necrotic debris and really cleans up quite nicely. The right area is worse with expansion. I did not debride this it is now over the base of the second toe ooThe area on his left buttock is smaller no debridement is required using silver collagen 5/4; left calf continues to make good progress. ooHe arrives with erythema around the wounds on his dorsal foot which even extends to the plantar aspect. Very concerning for coexistent infection. He is finished the erythromycin I gave him for possible erythrasma this does not seem to have helped. ooThe area on the left foot is about the same base of the dorsal toes ooIs area on the buttock looks improved on the left 5/11; left calf and left buttock continued to make good progress. Left foot is about the same to slightly improved. ooMajor problem is on the right foot. He has not had an x-ray. Deep tissue culture I did last week showed both Enterobacter and E. coli. I did not change the doxycycline I put him on empirically although neither 1 of these were plated to doxycycline. He arrives today with the erythema looking worse on both the dorsal and plantar foot. Macerated skin on the bottom of the foot. he has not been systemically unwell 5/18-Patient returns at 1 week, left calf wound appears to be making some progress, left buttock wound appears slightly worse than last time, left foot wound looks slightly better, right foot redness is marginally better. X-ray of both feet show no air or evidence of osteomyelitis. Patient is finished his Omnicef and terbinafine. He continues to have macerated skin on the bottom of the left foot as well as right 5/26; left calf wound is better, left buttock wound appears to have multiple  small superficial open areas with surrounding macerated skin. X-rays that I did last time showed no evidence of osteomyelitis in either foot. He  is finished cefdinir and doxycycline. I do not think that he was on terbinafine. He continues to have a large superficial open area on the right foot anterior dorsal and slightly between the first and second toes. I did send him to dermatology 2 months ago or so wondering about whether they would do a fungal scraping. I do not believe they did but did do a culture. We have been using silver alginate to the toe areas, he has been using antifungals at home topically either ketoconazole or Lotrisone. We are using silver collagen on the left foot, silver alginate on the right, silver collagen on the left lateral leg and silver alginate on the left buttock 6/1; left buttock area is healed. We have the left dorsal foot, left lateral leg and right dorsal foot. We are using silver alginate to the areas on both feet and silver collagen to the area on his left lateral calf 6/8; the left buttock apparently reopened late last week. He is not really sure how this happened. He is tolerating the terbinafine. Using silver alginate to all wounds 6/15; left buttock wound is larger than last week but still superficial. ooCame in the clinic today with a report of purulence from the left lateral leg I did not identify any infection ooBoth areas on his dorsal feet appear to be better. He is tolerating the terbinafine. Using silver alginate to all wounds 6/22; left buttock is about the same this week, left calf quite a bit better. His left foot is about the same however he comes in with erythema and warmth in the right forefoot once again. Culture that I gave him in the beginning of May showed Enterobacter and E. coli. I gave him doxycycline and things seem to improve although neither 1 of these organisms was specifically plated. 6/29; left buttock is larger and dry this  week. Left lateral calf looks to me to be improved. Left dorsal foot also somewhat improved right foot completely unchanged. The erythema on the right foot is still present. He is completing the Ceftin dinner that I gave him empirically [see discussion above.) 7/6 - All wounds look to be stable and perhaps improved, the left buttock wound is slightly smaller, per patient bleeds easily, completed ceftin, the right foot redness is less, he is on terbinafine 7/13; left buttock wound about the same perhaps slightly narrower. Area on the left lateral leg continues to narrow. Left dorsal foot slightly smaller right foot about the same. We are using silver alginate on the right foot and Hydrofera Blue to the areas on the left. Unna boot on the left 2 layer compression on the right 7/20; left buttock wound absolutely the same. Area on lateral leg continues to get better. Left dorsal foot require debridement as did the right no major change in the 7/27; left buttock wound the same size necrotic debris over the surface. The area on the lateral leg is closed once again. His left foot looks better right foot about the same although there is some involvement now of the posterior first second toe area. He is still on terbinafine which I have given him for a month, not certain a centimeter major change 06/25/19-All wounds appear to be slightly improved according to report, left buttock wound looks clean, both foot wounds have minimal to no debris the right dorsal foot has minimal slough. We are using Hydrofera Blue to the left and silver alginate to the right foot and ischial wound. 8/10-Wounds all appear to be  around the same, the right forefoot distal part has some redness which was not there before, however the wound looks clean and small. Ischial wound looks about the same with no changes 8/17; his wound on the left lateral calf which was his original chronic venous insufficiency wound remains closed. Since I  last saw him the areas on the left dorsal foot right dorsal foot generally appear better but require debridement. The area on his left initial tuberosity appears somewhat larger to me perhaps hyper granulated and bleeds very easily. We have been using Hydrofera Blue to the left dorsal foot and silver alginate to everything else 8/24; left lateral calf remains closed. The areas on his dorsal feet on the webspace of the first and second toes bilaterally both look better. The area on the left buttock which is the pressure ulcer stage II slightly smaller. I change the dressing to Hydrofera Blue to all areas 8/31; left lateral calf remains closed. The area on his dorsal feet bilaterally look better. Using Hydrofera Blue. Still requiring debridement on the left foot. No change in the left buttock pressure ulcers however 9/14; left lateral calf remains closed. Dorsal feet look quite a bit better than 2 weeks ago. Flaking dry skin also a lot better with the ammonium lactate I gave him 2 weeks ago. The area on the left buttock is improved. He states that his Roho cushion developed a leak and he is getting a new one, in the interim he is offloading this vigorously 9/21; left calf remains closed. Left heel which was a possible DTI looks better this week. He had macerated tissue around the left dorsal foot right foot looks satisfactory and improved left buttock wound. I changed his dressings to his feet to silver alginate bilaterally. Continuing Hydrofera Blue on the left buttock. 9/28 left calf remains closed. Left heel did not develop anything [possible DTI] dry flaking skin on the left dorsal foot. Right foot looks satisfactory. Improved left buttock wound. We are using silver alginate on his feet Hydrofera Blue on the buttock. I have asked him to go back to the Lotrisone on his feet including the wounds and surrounding areas 10/5; left calf remains closed. The areas on the left and right feet about the  same. A lot of this is epithelialized however debris over the remaining open areas. He is using Lotrisone and silver alginate. The area on the left buttock using Hydrofera Blue 10/26. Patient has been out for 3 weeks secondary to Covid concerns. He tested negative but I think his wife tested positive. He comes in today with the left foot substantially worse, right foot about the same. Even more concerning he states that the area on his left buttock closed over but then reopened and is considerably deeper in one aspect than it was before [stage III wound] 11/2; left foot really about the same as last week. Quarter sized wound on the dorsal foot just proximal to the first second toes. Surrounding erythema with areas of denuded epithelium. This is not really much different looking. Did not look like cellulitis this time however. ooRight foot area about the same.. We have been using silver alginate alginate on his toes ooLeft buttock still substantial irritated skin around the wound which I think looks somewhat better. We have been using Hydrofera Blue here. 11/9; left foot larger than last week and a very necrotic surface. Right foot I think is about the same perhaps slightly smaller. Debris around the circumference also addressed. Unfortunately on  the left buttock there is been a decline. Satellite lesions below the major wound distally and now a an additional one posteriorly we have been using Hydrofera Blue but I think this is a pressure issue 11/16; left foot ulcer dorsally again a very adherent necrotic surface. Right foot is about the same. Not much change in the pressure ulcer on his left buttock. 11/30; left foot ulcer dorsally basically the same as when I saw him 2 weeks ago. Very adherent fibrinous debris on the wound surface. Patient reports a lot of drainage as well. 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 We have been using Iodoflex,  patient changed back to alginate because of the drainage. Area on his right dorsal foot really looks benign with a healthier surface certainly a lot better than on the left. Left buttock wounds all improved using Hydrofera Blue 12/7; left dorsal foot again no improvement. Tightly adherent debris. PCR culture I did last week only showed likely skin contaminant. I have gone ahead and done a punch biopsy of this which is about the last thing in terms of investigations I can think to do. He has known venous insufficiency and venous hypertension and this could be the issue here. The area on the right foot is about the same left buttock slightly worse according to our intake nurse secondary to [facility] Blue sticking to the wound 12/14; biopsy of the left foot that I did last time showed changes that could be related to wound healing/chronic stasis dermatitis phenomenon no neoplasm. We have been using silver alginate to both feet. I change the one on the left today to Sorbact and silver alginate to his other 2 wounds 12/28; the patient arrives with the following problems; ooMajor issue is the dorsal left foot which continues to be a larger deeper wound area. Still with a completely nonviable surface ooParadoxically the area mirror image on the right on the right dorsal foot appears to be getting better. ooHe had some loss of dry denuded skin from the lower part of his original wound on the left lateral calf. Some of this area looked a little vulnerable and for this reason we put him in wrap that on this side this week ooThe area on his left buttock is larger. He still has the erythematous circular area which I think is a combination of pressure, sweat. This does not look like cellulitis or fungal dermatitis 11/26/2019; -Dorsal left foot large open wound with depth. Still debris over the surface. Using Sorbact ooThe area on the dorsal right foot paradoxically has closed over [facility] has a reopening  on the left ankle laterally at the base of his original wound that extended up into the calf. This appears clean. ooThe left buttock wound is smaller but with very adherent necrotic debris over the surface. We have been using silver alginate here as well The patient had arterial studies done in 2017. He had biphasic waveforms at the dorsalis pedis and posterior tibial bilaterally. ABI in the left was 1.17. Digit waveforms were dampened. He has slight spasticity in the great toes I do not think a TBI would be possible 1/11; the patient comes in today with a sizable reopening between the first and second toes on the right. This is not exactly in the same location where we have been treating wounds previously. According to our intake nurse this was actually fairly deep but 0.6 cm. The area on the left dorsal foot looks about  the same the surface is somewhat cleaner using Sorbact, his MRI is in 2 days. We have not managed yet to get arterial studies. The new reopening on the left lateral calf looks somewhat better using alginate. The left buttock wound is about the same using alginate 1/18; the patient had his ARTERIAL studies which were quite normal. ABI in the right at 1.13 with triphasic/biphasic waveforms on the left ABI 1.06 again with triphasic/biphasic waveforms. It would not have been possible to have done a toe brachial index because of spasticity. We have been using Sorbac to the left foot alginate to the rest of his wounds on the right foot left lateral calf and left buttock 1/25; arrives in clinic with erythema and swelling of the left forefoot worse over the first MTP area. This extends laterally dorsally and but also posteriorly. Still has an area on the left lateral part of the lower part of his calf wound it is eschared and clearly not closed. ooArea on the left buttock still with surrounding irritation and erythema. ooRight foot surface wound dorsally. The area between the right  and first and second toes appears better. Objective Constitutional Sitting or standing Blood Pressure is within target range for patient.. Pulse regular and within target range for patient.Marland Kitchen Respirations regular, non-labored and within target range.. Temperature is normal and within the target range for the patient.Marland Kitchen Appears in no distress. Vitals Time Taken: 7:52 AM, Height: 70 in, Weight: 216 lbs, BMI: 31, Temperature: 97.6 F, Pulse: 119 bpm, Respiratory Rate: 20 breaths/min, Blood Pressure: 127/72 mmHg. General Notes: Wound exam ooLeft dorsal foot the wound surface looks about the same and the measurements of the wound are about the same. Problem is the erythema and swelling centered around the first MTP both dorsally and plantar. This extends across the foot there is forward foot swelling. ooLeft lateral lower extremity requires debridement of the original wound that he came to this clinic for skin subcutaneous tissue and eschar. There is still small open areas in this area. ooLeft buttock wound seems to have contracted but surrounding erythema. This looks like irritation wetness I am just not sure ooDorsal part of the right foot certainly is not closed totally although the area between the first and second toes and plantar aspect looks to be better and closed Integumentary (Hair, Skin) Wound #24 status is Open. Original cause of wound was Gradually Appeared. The wound is located on the Left,Dorsal Foot. The wound measures 2.9cm length x 2.8cm width x 0.6cm depth; 6.377cm^2 area and 3.826cm^3 volume. There is Fat Layer (Subcutaneous Tissue) Exposed exposed. There is no tunneling or undermining noted. There is a large amount of serosanguineous drainage noted. The wound margin is distinct with the outline attached to the wound base. There is no granulation within the wound bed. There is a large (67-100%) amount of necrotic tissue within the wound bed including Adherent Slough. Wound #35  status is Open. Original cause of wound was Gradually Appeared. The wound is located on the Left Ischium. The wound measures 3.7cm length x 3cm width x 0.2cm depth; 8.718cm^2 area and 1.744cm^3 volume. There is Fat Layer (Subcutaneous Tissue) Exposed exposed. There is no tunneling or undermining noted. There is a medium amount of serosanguineous drainage noted. The wound margin is flat and intact. There is large (67-100%) red, friable granulation within the wound bed. There is no necrotic tissue within the wound bed. Wound #37 status is Open. Original cause of wound was Gradually Appeared. The wound is located  on the Left,Lateral Malleolus. The wound measures 2.5cm length x 0.5cm width x 0.1cm depth; 0.982cm^2 area and 0.098cm^3 volume. There is Fat Layer (Subcutaneous Tissue) Exposed exposed. There is no tunneling or undermining noted. There is a medium amount of serosanguineous drainage noted. The wound margin is distinct with the outline attached to the wound base. There is large (67-100%) pink granulation within the wound bed. There is no necrotic tissue within the wound bed. General Notes: macerated periwound. Wound #38 status is Open. Original cause of wound was Gradually Appeared. The wound is located on the Right Toe - Web between 1st and 2nd. The wound measures 4.5cm length x 3.5cm width x 0.1cm depth; 12.37cm^2 area and 1.237cm^3 volume. There is Fat Layer (Subcutaneous Tissue) Exposed exposed. There is no tunneling or undermining noted. There is a medium amount of serosanguineous drainage noted. The wound margin is distinct with the outline attached to the wound base. There is medium (34-66%) pink, pale granulation within the wound bed. There is a medium (34-66%) amount of necrotic tissue within the wound bed including Adherent Slough. 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 There was a Excisional Skin/Subcutaneous Tissue Debridement with a total area of 1.25 sq cm performed by Ricard Dillon., MD. With the following instrument(s): Curette to remove Viable and Non-Viable tissue/material. Material removed includes Eschar, Subcutaneous Tissue, and Skin: Epidermis. No specimens were taken. A time out was conducted at 08:22, prior to the start of the procedure. A Minimum amount of bleeding was controlled with Pressure. The procedure was tolerated well with a pain level of 0 throughout and a pain level of 0 following the procedure. Post Debridement Measurements: 2.5cm length x 0.5cm width x 0.1cm depth; 0.098cm^3 volume. Character of Wound/Ulcer Post Debridement is improved. Severity of Tissue Post Debridement is: Fat layer exposed. 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 There was a Three Layer Compression Therapy Procedure by Levan Hurst, RN. Post procedure Diagnosis Wound #37: Same as Pre-Procedure Plan Follow-up Appointments: Return Appointment in 1 week. Dressing Change Frequency: Wound #24 Left,Dorsal Foot: Do not change entire dressing for one week. Wound #35 Left Ischium: Change Dressing every other day. Wound #37 Left,Lateral Malleolus: Do not change entire dressing for one week. Wound #38 Right Toe - Web between 1st and 2nd: Change Dressing every other day. Skin Barriers/Peri-Wound Care: Antifungal cream - on toes on both feet  daily Moisturizing lotion - both legs Other: - Triamcinolone cream Wound #24 Left,Dorsal Foot: Barrier cream Moisturizing lotion - to both legs and feet Wound #35 Left Ischium: Antifungal cream - mix with barrier cream Barrier cream Primary Wound Dressing: Wound #24 Left,Dorsal Foot: Cutimed Sorbact - thin layer of hydrogel over Sorbact Wound #35 Left Ischium: Calcium Alginate with Silver Wound #37 Left,Lateral Malleolus: Calcium Alginate with Silver Wound #38 Right Toe - Web between 1st and 2nd: Calcium Alginate with Silver Secondary Dressing: Wound #24 Left,Dorsal Foot: Dry Gauze Heel Cup - add heel  cup to left heel for protection. Wound #35 Left Ischium: Foam Border - or ABD pad and tape Wound #37 Left,Lateral Malleolus: Dry Gauze Wound #38 Right Toe - Web between 1st and 2nd: Kerlix/Rolled Gauze - secure with tape Dry Gauze Edema Control: 3 Layer Compression System - Left Lower Extremity Elevate legs to the level of the heart or above for 30 minutes daily and/or when sitting, a frequency of: - throughout the day Support Garment 30-40 mm/Hg pressure to: - Juxtalite to right leg Off-Loading: Low air-loss mattress (Group 2) Roho cushion for wheelchair Turn and reposition every 2 hours - out of wheelchair throughout the day, try to lay on sides, sleep in the bed not the recliner The following medication(s) was prescribed: doxycycline monohydrate oral 100 mg capsule 1 capsule oral bid for 7 days for left fooot infection starting 12/17/2019 1. Left foot required antibiotics doxycycline I am continuing Sorbac to the wound however. 2. We are continuing his silver alginate to the other wound areas 3. Could not get an MRI of the left foot through his insurance because of prior authorization problems which took up to an hour and 15 minutes in our clinic which we simply do not have. I went over this with him and suggested he call his insurance to involve them in complaining about  this 4. I counseled him that if the erythema moves out of the left foot he will need to seek urgent medical care perhaps in the ER. If that is the case I would asked that the ER order the MRI where I think insurance companies are obligated to pay Electronic Signature(s) Signed: 12/18/2019 5:32:11 PM By: Linton Ham MD Entered By: Linton Ham on 12/17/2019 08:42:12 -------------------------------------------------------------------------------- SuperBill Details Patient Name: Date of Service: [name] 12/17/2019 Medical Record [id number] Patient Account Number: [id number] Date of Birth/Sex: Treating RN: 1985-11-26 (34 y.o. 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 L03.116 Cellulitis of left lower limb Facility Procedures CPT4 Code Description: [id number] - DEB SUBQ TISSUE 20 SQ CM/< ICD-10 Diagnosis Description L97.521 Non-pressure chronic ulcer of other part of left foot limit Modifier: ed to breakdow Quantity: 1 n of skin Physician Procedures CPT4 Code Description: [id number] - WC PHYS SUBQ TISS 20 SQ CM ICD-10 Diagnosis Description L97.521 Non-pressure chronic ulcer of other part of left foot limit Modifier: ed to breakdow Quantity: 1 n of skin Electronic Signature(s) Signed: 12/18/2019 5:32:11 PM By: Linton Ham MD Entered By: Linton Ham on 12/17/2019 08:42:36

## 2019-12-20 NOTE — Progress Notes (Signed)
Navarette, LARRI HAMEL ([id number]) Visit Report for 12/17/2019 Arrival Information Details Patient Name: Date of Service: DEMARION, OVERBAUGH 12/17/2019 7:30 AM Medical Record [id number] Patient Account Number: [id number] Date of Birth/Sex: Treating RN: 03/01/1986 (34 y.o. [name] Primary Care Christabella Alvira: [place], [place] Other Clinician: Referring Arwyn Besaw: Treating Josalyn Dettmann/Extender:Robson, [name], [name]: 206 Visit Information History Since Last Visit All ordered tests and consults were completed: No Patient Arrived: Wheel Chair Added or deleted any medications: No Arrival Time: 07:47 Any new allergies or adverse reactions: No Accompanied By: self Had a fall or experienced change in No activities of daily living that may affect Transfer Assistance: None risk of falls: Patient Identification Verified: Yes Signs or symptoms of abuse/neglect since last No Secondary Verification Process Completed: Yes visito Patient Requires Transmission-Based No Hospitalized since last visit: No Precautions: Implantable device outside of the clinic excluding No Patient Has Alerts: Yes cellular tissue based products placed in the center Patient Alerts: R ABI = since last visit: 1.0 Has Dressing in Place as Prescribed: Yes L ABI = Has Compression in Place as Prescribed: Yes 1.1 Pain Present Now: No Electronic Signature(s) Signed: 12/17/2019 5:49:19 PM By: Deon Pilling Entered By: Deon Pilling on 12/17/2019 07:52:00 -------------------------------------------------------------------------------- Compression Therapy Details Patient Name: Date of Service: ISAIS, SADLOWSKI 12/17/2019 7:30 AM Medical Record [id number] Patient Account Number: [id number] Date of Birth/Sex: Treating RN: 1986/07/27 (34 y.o. [name] Primary Care Silvestre Mines: [place], [place] Other Clinician: Referring Shellsea Borunda: Treating Hiilani Jetter/Extender:Robson, [name], [name] Weeks in  Treatment: 206 Compression Therapy Performed for Wound Wound #37 Left,Lateral Malleolus Assessment: Performed By: Clinician Levan Hurst, RN Compression Type: Three Layer Post Procedure Diagnosis Same as Pre-procedure Electronic Signature(s) Signed: 12/17/2019 6:46:58 PM By: Levan Hurst RN, BSN Entered By: Levan Hurst on 12/17/2019 08:29:53 -------------------------------------------------------------------------------- Encounter Discharge Information Details Patient Name: Date of Service: Dhingra, [name] 12/17/2019 7:30 AM Medical Record [id number] Patient Account Number: [id number] Date of Birth/Sex: Treating RN: 1986-02-04 (34 y.o. [name] Primary Care Keyleigh Manninen: [place], [place] Other Clinician: Referring Teaghan Melrose: Treating Brionne Mertz/Extender:Robson, [name], [name]: 206 Encounter Discharge Information Items Post Procedure Vitals Discharge Condition: Stable Temperature (F): 97.6 Ambulatory Status: Wheelchair Pulse (bpm): 119 Discharge Destination: Home Respiratory Rate (breaths/min): 20 Transportation: Other Blood Pressure (mmHg): 127/72 Accompanied By: self Schedule Follow-up Appointment: Yes Clinical Summary of Care: Patient Declined Electronic Signature(s) Signed: 12/20/2019 7:44:20 AM By: Kela Millin Entered By: Kela Millin on 12/17/2019 08:38:44 -------------------------------------------------------------------------------- Lower Extremity Assessment Details Patient Name: Date of Service: Kates, [name] 12/17/2019 7:30 AM Medical Record [id number] Patient Account Number: [id number] Date of Birth/Sex: Treating RN: [birth year] (34 y.o. [name] Primary Care Lemar Bakos: [name], [name] Other Clinician: Referring Trenisha Lafavor: Treating Mika Anastasi/Extender:Robson, [name], [name]: 206 Edema Assessment Assessed: [Left: Yes] [Right: Yes] Edema: [Left: Yes] [Right: Yes] Calf Left:  Right: Point of Measurement: 33 cm From Medial Instep 27 cm 33 cm Ankle Left: Right: Point of Measurement: 10 cm From Medial Instep 24.5 cm 24.5 cm Electronic Signature(s) Signed: 12/17/2019 5:49:19 PM By: Deon Pilling Entered By: Deon Pilling on 12/17/2019 07:55:47 -------------------------------------------------------------------------------- Multi Wound Chart Details Patient Name: Date of Service: [name]. 12/17/2019 7:30 AM Medical Record [id number] Patient Account Number: [id number] Date of Birth/Sex: Treating RN: 28-Jul-1986 (34 y.o. M) Primary Care Oliviana Mcgahee: [name], [name] Other Clinician: Referring Blakeley Scheier: Treating Shuntae Herzig/Extender:Robson, [name], [name]: 206 Vital Signs Height(in): 70 Pulse(bpm): 119 Weight(lbs): 216 Blood Pressure(mmHg): 127/72 Body Mass Index(BMI): 31 Temperature(F): 97.6 Respiratory 20  Rate(breaths/min): Photos: [24:No Photos] [35:No Photos] [37:No Photos] Wound Location: [24:Left Foot - Dorsal] [35:Left Ischium] [37:Left Malleolus - Lateral] Wounding Event: [24:Gradually Appeared] [35:Gradually Appeared] [37:Gradually Appeared] Primary Etiology: [24:Inflammatory] [35:Pressure Ulcer] [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] Comorbid History: [24:Sleep Apnea, Hypertension, Sleep Apnea, Hypertension, Sleep Apnea, Hypertension, Paraplegia] [35:Paraplegia] [37:Paraplegia] Date Acquired: [24:03/08/2018] [35:04/28/2019] [37:11/26/2019] Weeks of Treatment: [24:92] [35:33] [37:3] Wound Status: [24:Open] [35:Open] [37:Open] Clustered Wound: [24:Yes] [35:Yes] [37:No] Clustered Quantity: [24:1] [35:2] [37:N/A] Measurements L x W x D 2.9x2.8x0.6 [35:3.7x3x0.2] [37:2.5x0.5x0.1] (cm) Area (cm) : [24:6.377] [35:8.718] [37:0.982] Volume (cm) : [24:3.826] [35:1.744] [37:0.098] % Reduction in Area: [24:-2602.10%] [35:38.30%] [37:75.20%] % Reduction in Volume: -15841.70% [35:-23.30%] [37:75.30%] Classification: [24:Full Thickness Without  Exposed Support Structures] [35:Category/Stage III] [37:Full Thickness Without Exposed Support Structures] Exudate Amount: [24:Large] [35:Medium] [37:Medium] Exudate Type: [24:Serosanguineous] [35:Serosanguineous] [37:Serosanguineous] Exudate Color: [24:red, brown] [35:red, brown] [37:red, brown] Wound Margin: [24:Distinct, outline attached] [35:Flat and Intact] [37:Distinct, outline attached] Granulation Amount: [24:None Present (0%)] [35:Large (67-100%)] [37:Large (67-100%)] Granulation Quality: [24:N/A] [35:Red, Friable] [37:Pink] Necrotic Amount: [24:Large (67-100%)] [35:None Present (0%)] [37:None Present (0%)] Exposed Structures: [24:Fat Layer (Subcutaneous Tissue) Exposed: Yes Fascia: No Tendon: No Muscle: No Joint: No Bone: No] [35:Fat Layer (Subcutaneous Tissue) Exposed: Yes Fascia: No Tendon: No Muscle: No Joint: No Bone: No] [37:Fat Layer (Subcutaneous Tissue) Exposed: Yes  Fascia: No Tendon: No Muscle: No Joint: No Bone: No] Epithelialization: [24:None] [35:Small (1-33%)] [37:Small (1-33%)] Debridement: [24:N/A] [35:N/A] [37:Debridement - Excisional] Pre-procedure [24:N/A] [35:N/A] [37:08:22] Verification/Time Out Taken: Tissue Debrided: [24:N/A] [35:N/A] [37:Subcutaneous] Level: [24:N/A] [35:N/A] [37:Skin/Subcutaneous Tissue] Debridement Area (sq cm):N/A [35:N/A] [37:1.25] Instrument: [24:N/A] [35:N/A] [37:Curette] Bleeding: [24:N/A] [35:N/A] [37:Minimum] Hemostasis Achieved: [24:N/A] [35:N/A] [37:Pressure] Procedural Pain: [24:N/A] [35:N/A] [37:0] Post Procedural Pain: [24:N/A] [35:N/A] [37:0] Debridement Treatment N/A [35:N/A] [37:Procedure was tolerated] Response: [37:well] Post Debridement [24:N/A] [35:N/A] [37:2.5x0.5x0.1] Measurements L x W x D (cm) Post Debridement [24:N/A] [35:N/A] [37:0.098] Volume: (cm) Assessment Notes: [24:N/A] [35:N/A] [37:macerated periwound.] Procedures Performed: N/A [35:N/A 38] [37:Compression Therapy Debridement N/A] Photos: [24:No  Photos] [35:N/A] [37:N/A] Wound Location: [24:Right Toe - Web between N/A 1st and 2nd] [37:N/A] Wounding Event: [24:Gradually Appeared] [35:N/A] [37:N/A] Primary Etiology: [24:Inflammatory] [35:N/A] [37:N/A] Comorbid History: [24:Sleep Apnea, Hypertension, N/A Paraplegia] [37:N/A] Date Acquired: [24:11/30/2019] [35:N/A] [37:N/A] Weeks of Treatment: [24:2] [35:N/A] [37:N/A] Wound Status: [24:Open] [35:N/A] [37:N/A] Clustered Wound: [24:No] [35:N/A] [37:N/A] Clustered Quantity: [24:N/A] [35:N/A] [37:N/A] Measurements L x W x D 4.5x3.5x0.1 [35:N/A] [37:N/A] (cm) Area (cm) : [24:12.37] [35:N/A] [37:N/A] Volume (cm) : [24:1.237] [35:N/A] [37:N/A] % Reduction in Area: [24:-3648.50%] [35:N/A] [37:N/A] % Reduction in Volume: -435.50% [35:N/A] [37:N/A] Classification: [24:Full Thickness Without Exposed Support Structures] [35:N/A] [37:N/A] Exudate Amount: [24:Medium] [35:N/A] [37:N/A] Exudate Type: [24:Serosanguineous] [35:N/A] [37:N/A] Exudate Color: [24:red, brown] [35:N/A] [37:N/A] Wound Margin: [24:Distinct, outline attached N/A] [37:N/A] Granulation Amount: [24:Medium (34-66%)] [35:N/A] [37:N/A] Granulation Quality: [24:Pink, Pale] [35:N/A] [37:N/A] Necrotic Amount: [24:Medium (34-66%)] [35:N/A] [37:N/A] Exposed Structures: [24:Fat Layer (Subcutaneous Tissue) Exposed: Yes Fascia: No Tendon: No Muscle: No Joint: No Bone: No] [35:N/A] [37:N/A] Epithelialization: [24:None] [35:N/A] [37:N/A] Debridement: [24:N/A] [35:N/A] [37:N/A] Tissue Debrided: [24:N/A] [35:N/A] [37:N/A] Level: [24:N/A] [35:N/A] [37:N/A] Debridement Area (sq cm):N/A [35:N/A] [37:N/A] Instrument: [24:N/A] [35:N/A] [37:N/A] Bleeding: [24:N/A] [35:N/A] [37:N/A] Hemostasis Achieved: [24:N/A] [35:N/A] [37:N/A] Procedural Pain: [24:N/A] [35:N/A] [37:N/A] Post Procedural Pain: [24:N/A] [35:N/A] [37:N/A] Debridement Treatment N/A [35:N/A] [37:N/A] Response: Post Debridement [24:N/A] [35:N/A] [37:N/A] Measurements L x W x  D (cm) Post Debridement [24:N/A] [35:N/A] [37:N/A] Volume: (cm) Assessment Notes: [24:N/A] [35:N/A N/A] [37:N/A N/A] Treatment Notes Electronic Signature(s) Signed: 12/18/2019 5:32:11 PM By: Linton Ham MD Entered By: Linton Ham  on 12/17/2019 08:33:06 -------------------------------------------------------------------------------- [place] Details Patient Name: Date of Service: DEVEN, NIMTZ 12/17/2019 7:30 AM Medical Record [id number] Patient Account Number: [id number] Date of Birth/Sex: Treating RN: July 24, 1986 (34 y.o. [name] Primary Care Tria Noguera: [name], [name] Other Clinician: Referring Tryphena Perkovich: Treating Ellyse Rotolo/Extender:Robson, [name], [name]: 206 Active Inactive Wound/Skin Impairment Nursing Diagnoses: Impaired tissue integrity Knowledge deficit related to ulceration/compromised skin integrity Goals: Patient/caregiver will verbalize understanding of skin care regimen Date Initiated: 01/05/2016 Target Resolution Date: 01/18/2020 Goal Status: Active Ulcer/skin breakdown will have a volume reduction of 30% by week 4 Date Initiated: 01/05/2016 Date Inactivated: 12/22/2017 Target Resolution Date: 01/19/2018 Unmet Reason: complex Goal Status: Unmet wounds, infection Interventions: Assess patient/caregiver ability to obtain necessary supplies Assess ulceration(s) every visit Provide education on ulcer and skin care Notes: Electronic Signature(s) Signed: 12/17/2019 6:46:58 PM By: Levan Hurst RN, BSN Entered By: Levan Hurst on 12/17/2019 08:22:57 -------------------------------------------------------------------------------- Pain Assessment Details Patient Name: Date of Service: Teems, [name] 12/17/2019 7:30 AM Medical Record [id number] Patient Account Number: [id number] Date of Birth/Sex: Treating RN: [birth year] (34 y.o. [name] Primary Care Malania Gawthrop: [place], [place] Other  Clinician: Referring Tokiko Diefenderfer: Treating Renley Gutman/Extender:Robson, [name], [name]: 206 Active Problems Location of Pain Severity and Description of Pain Patient Has Paino No Site Locations Rate the pain. Current Pain Level: 0 Pain Management and Medication Current Pain Management: Medication: No Cold Application: No Rest: No Massage: No Activity: No T.E.N.S.: No Heat Application: No Leg drop or elevation: No Is the Current Pain Management Adequate: Adequate How does your wound impact your activities of daily livingo Sleep: No Bathing: No Appetite: No Relationship With Others: No Bladder Continence: No Emotions: No Bowel Continence: No Work: No Toileting: No Drive: No Dressing: No Hobbies: No Electronic Signature(s) Signed: 12/17/2019 5:49:19 PM By: Deon Pilling Entered By: Deon Pilling on 12/17/2019 07:52:35 -------------------------------------------------------------------------------- Patient/Caregiver Education Details Patient Name: Date of Service: [name] 1/25/2021andnbsp7:30 AM Medical Record [phone] [id number] Patient Account Number: [id number] Date of Birth/Gender: 10-27-1986 (33 y.o. M) Treating RN: Levan Hurst Primary Care Physician: Janine Limbo Other Clinician: Referring Physician: Treating Physician/Extender:Robson, Pecola Leisure in Treatment: 59 Education Assessment Education Provided To: Patient Education Topics Provided Wound/Skin Impairment: Methods: Explain/Verbal Responses: State content correctly Electronic Signature(s) Signed: 12/17/2019 6:46:58 PM By: Levan Hurst RN, BSN Entered By: Levan Hurst on 12/17/2019 08:23:12 -------------------------------------------------------------------------------- Wound Assessment Details Patient Name: Date of Service: Mearns, [name] 12/17/2019 7:30 AM Medical Record [id number] Patient Account Number: [id number] Date of Birth/Sex: Treating  RN: 09-06-1986 (34 y.o. M) Primary Care Suriya Kovarik: [place], [place] Other Clinician: Referring Dilcia Rybarczyk: Treating Giani Winther/Extender:Robson, [name], [name]: 206 Wound Status Wound Number: 24 Primary Etiology: Inflammatory Wound Location: Left Foot - Dorsal Wound Status: Open Wounding Event: Gradually Appeared Comorbid Sleep Apnea, Hypertension, History: Paraplegia Date Acquired: 03/08/2018 Weeks Of Treatment: 92 Clustered Wound: Yes Photos Wound Measurements Length: (cm) 2.9 % Reduct Width: (cm) 2.8 % Reduct Depth: (cm) 0.6 Epitheli Clustered Quantity: 1 Tunnelin Area: (cm) 6.377 Undermi Volume: (cm) 3.826 Wound Description Classification: Full Thickness Without Exposed Support Foul Odo Structures Slough/F Wound Distinct, outline attached Margin: Exudate Large Amount: Exudate Serosanguineous Type: Exudate red, brown Color: Wound Bed Granulation Amount: None Present (0%) Necrotic Amount: Large (67-100%) Fascia E Necrotic Quality: Adherent Slough Fat Laye Tendon E Muscle E Joint Ex Bone Exp r After Cleansing: No ibrino Yes Exposed Structure xposed: No r (Subcutaneous Tissue) Exposed: Yes xposed: No xposed: No posed: No osed: No ion  in Area: -2602.1% ion in Volume: -15841.7% alization: None g: No ning: No Treatment Notes Wound #24 (Left, Dorsal Foot) 1. Cleanse With Wound Cleanser 2. Periwound Care Barrier cream Moisturizing lotion 3. Primary Dressing Applied Other primary dressing (specifiy in notes) 4. Secondary Dressing Dry Gauze Heel Cup 6. Support Layer Applied 3 layer compression wrap Notes sorbact with thin layer of hydrogel Electronic Signature(s) Signed: 12/17/2019 4:40:42 PM By: Mikeal Hawthorne EMT/HBOT Entered By: Mikeal Hawthorne on 12/17/2019 14:10:30 -------------------------------------------------------------------------------- Wound Assessment Details Patient Name: Date of Service: Toback, Jamond E.  12/17/2019 7:30 AM Medical Record [id number] Patient Account Number: [id number] Date of Birth/Sex: Treating RN: [birth year] (34 y.o. M) Primary Care Calob Baskette: [place], [place] Other Clinician: Referring Wonda Goodgame: Treating Jerry Clyne/Extender:Robson, [name], [name]: 206 Wound Status Wound Number: 35 Primary Etiology: Pressure Ulcer Wound Location: Left Ischium Wound Status: Open Wounding Event: Gradually Appeared Comorbid Sleep Apnea, Hypertension, History: Paraplegia Date Acquired: 04/28/2019 Weeks Of Treatment: 33 Clustered Wound: Yes Photos Wound Measurements Length: (cm) 3.7 Width: (cm) 3 Depth: (cm) 0.2 Clustered Quantity: 2 Area: (cm) 8.718 Volume: (cm) 1.744 Wound Description Classification: Category/Stage III Wound Margin: Flat and Intact Exudate Amount: Medium Exudate Type: Serosanguineous Exudate Color: red, brown Wound Bed Granulation Amount: Large (67-100%) Granulation Quality: Red, Friable Necrotic Amount: None Present (0%) Foul Odor After Cleansing: No Slough/Fibrino No Exposed Structure Fascia Exposed: No Fat Layer (Subcutaneous Tissue) Exposed: Ye Tendon Exposed: No Muscle Exposed: No Joint Exposed: No Bone Exposed: No % Reduction in Area: 38.3% % Reduction in Volume: -23.3% Epithelialization: Small (1-33%) Tunneling: No Undermining: No s Treatment Notes Wound #35 (Left Ischium) 1. Cleanse With Wound Cleanser 2. Periwound Care Antifungal cream Barrier cream 3. Primary Dressing Applied Calcium Alginate Ag 4. Secondary Dressing Foam Electronic Signature(s) Signed: 12/17/2019 4:40:42 PM By: Mikeal Hawthorne EMT/HBOT Entered By: Mikeal Hawthorne on 12/17/2019 14:05:13 -------------------------------------------------------------------------------- Wound Assessment Details Patient Name: Date of Service: Almon, Mishael E. 12/17/2019 7:30 AM Medical Record [id number] Patient Account Number: [id number] Date of  Birth/Sex: Treating RN: June 13, 1986 (34 y.o. M) Primary Care Tawonna Esquer: [place], [place] Other Clinician: Referring Mays Paino: Treating Jairen Goldfarb/Extender:Robson, [name], [name]: 206 Wound Status Wound Number: 37 Primary Etiology: Venous Leg Ulcer Wound Location: Left Malleolus - Lateral Wound Status: Open Wounding Event: Gradually Appeared Comorbid Sleep Apnea, Hypertension, History: Paraplegia Date Acquired: 11/26/2019 Weeks Of Treatment: 3 Clustered Wound: No Photos Wound Measurements Length: (cm) 2.5 % Reduct Width: (cm) 0.5 % Reduct Depth: (cm) 0.1 Epitheli Area: (cm) 0.982 Tunneli Volume: (cm) 0.098 Undermi Wound Description Classification: Full Thickness Without Exposed Support Foul Odo Structures Slough/F Wound Distinct, outline attached Margin: Exudate Medium Amount: Exudate Serosanguineous Type: Exudate red, brown Color: Wound Bed Granulation Amount: Large (67-100%) Granulation Quality: Pink Fascia E Necrotic Amount: None Present (0%) Fat Laye Tendon E Muscle E Joint Ex Bone Exp r After Cleansing: No ibrino No Exposed Structure xposed: No r (Subcutaneous Tissue) Exposed: Yes xposed: No xposed: No posed: No osed: No ion in Area: 75.2% ion in Volume: 75.3% alization: Small (1-33%) ng: No ning: No Assessment Notes macerated periwound. Treatment Notes Wound #37 (Left, Lateral Malleolus) 1. Cleanse With Wound Cleanser 2. Periwound Care Barrier cream TCA Cream 3. Primary Dressing Applied Calcium Alginate Ag 4. Secondary Dressing Dry Gauze 6. Support Layer Applied 3 layer compression wrap Electronic Signature(s) Signed: 12/17/2019 4:40:42 PM By: Mikeal Hawthorne EMT/HBOT Entered By: Mikeal Hawthorne on 12/17/2019 14:06:42 -------------------------------------------------------------------------------- Wound Assessment Details Patient Name: Date of Service: Warsame, Artyom E. 12/17/2019 7:30 AM Medical Record [id number]  Patient Account Number: [id number] Date of Birth/Sex: Treating RN: [birth year] (34 y.o. M) Primary Care Nakshatra Klose: [name], [name] Other Clinician: Referring Holley Kocurek: Treating Dwanna Goshert/Extender:Robson, [name], [name]: 206 Wound Status Wound Number: 38 Primary Etiology: Inflammatory Wound Location: Right Toe - Web between 1st and Wound Status: Open 2nd Comorbid Sleep Apnea, Hypertension, Wounding Event: Gradually Appeared History: Paraplegia Date Acquired: 11/30/2019 Weeks Of Treatment: 2 Clustered Wound: No Photos Wound Measurements Length: (cm) 4.5 % Reduct Width: (cm) 3.5 % Reduct Depth: (cm) 0.1 Epitheli Area: (cm) 12.37 Tunneli Volume: (cm) 1.237 Undermi Wound Description Classification: Full Thickness Without Exposed Support Foul Odo Structures Slough/F Wound Distinct, outline attached Margin: Exudate Medium Amount: Exudate Serosanguineous Type: Exudate red, brown Color: Wound Bed Granulation Amount: Medium (34-66%) Granulation Quality: Pink, Pale Fascia E Necrotic Amount: Medium (34-66%) Fat Laye Necrotic Quality: Adherent Slough Tendon E Muscle Exp Joint Expo Bone Expos r After Cleansing: No ibrino Yes Exposed Structure xposed: No r (Subcutaneous Tissue) Exposed: Yes xposed: No osed: No sed: No ed: No ion in Area: -3648.5% ion in Volume: -435.5% alization: None ng: No ning: No Treatment Notes Wound #38 (Right Toe - Web between 1st and 2nd) 1. Cleanse With Wound Cleanser 2. Periwound Care Barrier cream TCA Cream 3. Primary Dressing Applied Calcium Alginate Ag 4. Secondary Dressing Dry Gauze Roll Gauze 5. Secured With Tape Notes Development worker, international aid) Signed: 12/17/2019 4:40:42 PM By: Mikeal Hawthorne EMT/HBOT Entered By: Mikeal Hawthorne on 12/17/2019 14:21:42 -------------------------------------------------------------------------------- Vitals Details Patient Name: Date of Service: Rosario, Otniel E.  12/17/2019 7:30 AM Medical Record [id number] Patient Account Number: [id number] Date of Birth/Sex: Treating RN: May 09, 1986 (34 y.o. [name] Primary Care Lorenza Winkleman: [place], [place] Other Clinician: Referring Estes Lehner: Treating Demarie Hyneman/Extender:Robson, [name], [name]: 206 Vital Signs Time Taken: 07:52 Temperature (F): 97.6 Height (in): 70 Pulse (bpm): 119 Weight (lbs): 216 Respiratory Rate (breaths/min): 20 Body Mass Index (BMI): 31 Blood Pressure (mmHg): 127/72 Reference Range: 80 - 120 mg / dl Electronic Signature(s) Signed: 12/17/2019 5:49:19 PM By: Deon Pilling Entered By: Deon Pilling on 12/17/2019 07:52:23

## 2019-12-24 ENCOUNTER — Other Ambulatory Visit: Payer: Self-pay

## 2019-12-24 ENCOUNTER — Encounter (HOSPITAL_BASED_OUTPATIENT_CLINIC_OR_DEPARTMENT_OTHER): Payer: BC Managed Care – PPO | Attending: Internal Medicine | Admitting: Internal Medicine

## 2019-12-24 DIAGNOSIS — G8221 Paraplegia, complete: Secondary | ICD-10-CM | POA: Insufficient documentation

## 2019-12-24 DIAGNOSIS — Z9081 Acquired absence of spleen: Secondary | ICD-10-CM | POA: Insufficient documentation

## 2019-12-24 DIAGNOSIS — L97321 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 to breakdown of skin: Secondary | ICD-10-CM | POA: Insufficient documentation

## 2019-12-24 DIAGNOSIS — L97322 Non-pressure chronic ulcer of left 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: Secondary | ICD-10-CM | POA: Diagnosis not present

## 2019-12-24 DIAGNOSIS — Z880 Allergy status to penicillin: Secondary | ICD-10-CM | POA: Insufficient documentation

## 2019-12-24 DIAGNOSIS — Z8614 Personal history of Methicillin resistant Staphylococcus aureus infection: Secondary | ICD-10-CM | POA: Insufficient documentation

## 2019-12-24 DIAGNOSIS — I890 Lymphedema, not elsewhere classified: Secondary | ICD-10-CM | POA: Diagnosis not present

## 2019-12-24 DIAGNOSIS — Z87891 Personal history of nicotine dependence: Secondary | ICD-10-CM | POA: Diagnosis not present

## 2019-12-24 DIAGNOSIS — L89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, stage 3: Secondary | ICD-10-CM | POA: Diagnosis not present

## 2019-12-24 DIAGNOSIS — Z888 Allergy status to other drugs, medicaments and biological substances status: Secondary | ICD-10-CM | POA: Diagnosis not present

## 2019-12-24 DIAGNOSIS — Z882 Allergy status to sulfonamides status: Secondary | ICD-10-CM | POA: Diagnosis not present

## 2019-12-24 DIAGNOSIS — I10 Essential (primary) hypertension: Secondary | ICD-10-CM | POA: Diagnosis not present

## 2019-12-24 DIAGNOSIS — Z881 Allergy status to other antibiotic agents status: Secondary | ICD-10-CM | POA: Diagnosis not present

## 2019-12-24 DIAGNOSIS — G4730 Sleep apnea, unspecified: Secondary | ICD-10-CM | POA: Diagnosis not present

## 2019-12-24 DIAGNOSIS — I872 Venous insufficiency (chronic) (peripheral): Secondary | ICD-10-CM | POA: Diagnosis not present

## 2019-12-24 DIAGNOSIS — L97511 Non-pressure chronic ulcer of other part of right foot limited to breakdown of skin: Secondary | ICD-10-CM | POA: Insufficient documentation

## 2019-12-24 DIAGNOSIS — L97521 Non-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: Secondary | ICD-10-CM | POA: Diagnosis not present

## 2019-12-24 DIAGNOSIS — L03116 Cellulitis of left lower limb: Secondary | ICD-10-CM | POA: Insufficient documentation

## 2019-12-24 DIAGNOSIS — L03115 Cellulitis of right lower limb: Secondary | ICD-10-CM | POA: Diagnosis not present

## 2019-12-24 DIAGNOSIS — L97522 Non-pressure chronic ulcer of other part of left foot with fat layer exposed: Secondary | ICD-10-CM | POA: Diagnosis not present

## 2019-12-24 NOTE — Progress Notes (Signed)
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 refractory to multiple dressing attempts. He also has areas in his first and second toes bilaterally in the anterior and posterior web space. I had been using Iodoflex here after a prolonged course of silver alginate with ketoconazole was ineffective [question tinea pedis] 07/14/17; patient arrives today with a very difficult adherent material over his left lateral lower leg wound. He also has surrounding erythema and poorly controlled edema. He was switched his Santyl last visit which the nurses are applying once during his doctor visit and once on a nurse visit. He was also reduced to 2 layer compression I'm not exactly sure of the issue here. 07/21/17; better surface today after 1 week of Iodoflex. Significant cellulitis that we treated last week also better. [Doxycycline] 07/28/17 better surface today with now 2 weeks of Iodoflex. Significant cellulitis treated with doxycycline. He has now completed the doxycycline and he is back to his usual degree of chronic venous inflammation/stasis dermatitis. He reminds me he has had ablations surgery here  08/04/17; continued improvement with Iodoflex to the left lateral leg wound in terms of the surface of the wound although the dimensions are better. He is  not currently on any antibiotics, he has the usual degree of chronic venous inflammation/stasis dermatitis. Problematic areas on the plantar aspect of the first second toe web space on the left and the dorsal aspect of the first second toe web space on the right. At one point I felt these were probably related to chronic fungal infections in treated him aggressively for this although we have not made any improvement here. 08/11/17; left lateral leg. Surface continues to improve with the Iodoflex although we are not seeing much improvement in overall wound dimensions. Areas on his plantar left foot and right foot show no improvement. In fact the right foot looks somewhat worse 08/18/17; left lateral leg. We changed to [facility] Blue last week after a prolonged course of Iodoflex which helps get the surface better. It appears that the wound with is improved. Continue with difficult areas on the left dorsal first second and plantar first second on the right 09/01/17; patient arrives in clinic today having had a temperature of 103 yesterday. He was seen in the ER and [facility]. The patient was concerned he could have cellulitis again in the right leg however they diagnosed him with a UTI and he is now on Keflex. He has a history of cellulitis which is been recurrent and difficult but this is been in the left leg, in the past 5 use doxycycline. He does in and out catheterizations at home which are risk factors for UTI 09/08/17; patient will be completing his Keflex this weekend. The erythema on the left leg is considerably better. He has a new wound today on the medial part of the right leg small superficial almost looks like a skin tear. He has worsening of the area on the right dorsal first and second toe. His major area on the left lateral leg is better. Using Hydrofera Blue on all areas 09/15/17; gradual reduction in width on the long wound in the left lateral leg. No debridement required. He also  has wounds on the plantar aspect of his left first second toe web space and on the dorsal aspect of the right first second toe web space. 09/22/17; there continues to be very gradual improvements in the dimensions of the left lateral leg wound. He hasn't round erythematous spot with might be pressure on his wheelchair. There is no evidence obviously of infection no purulence no warmth He has a dry scaled area on the plantar aspect of the left first second toe Improved area on the dorsal right first second toe. 09/29/17; left lateral leg wound continues to improve in dimensions mostly with an is still a fairly long but increasingly narrow wound. He has a dry scaled area on the plantar aspect of his left first second toe web space Increasingly concerning area on the dorsal right first second toe. In fact I am concerned today about possible cellulitis around this wound. The areas extending up his second toe and although there is deformities here almost appears to abut on the nailbed. 10/06/17; left lateral leg wound continues to make very gradual progress. Tissue culture I did from the right first second toe dorsal foot last time grew MRSA and enterococcus which was vancomycin sensitive. This was not sensitive to clindamycin or doxycycline. He is allergic to Zyvox and sulfa we have therefore arrange for him to have dalvance infusion tomorrow. He  is had this in the past and tolerated it well 10/20/17; left lateral leg wound continues to make decent progress. This is certainly reduced in terms of with there is advancing epithelialization.The cellulitis in the right foot looks better although he still has a deep wound in the dorsal aspect of the first second toe web space. Plantar left first toe web space on the left I think is making some progress 10/27/17; left lateral leg wound continues to make decent progress. Advancing epithelialization.using Hydrofera Blue The right first second toe web space  wound is better-looking using silver alginate Improvement in the left plantar first second toe web space. Again using silver alginate 11/03/17 left lateral leg wound continues to make decent progress albeit slowly. Using [facility] The right per second toe web space continues to be a very problematic looking punched out wound. I obtained a piece of tissue for deep culture I did extensively treated this for fungus. It is difficult to imagine that this is a pressure area as the patient states other than going outside he doesn't really wear shoes at home The left plantar first second toe web space looked fairly senescent. Necrotic edges. This required debridement change to [facility] Blue to all wound areas 11/10/17; left lateral leg wound continues to contract. Using Hydrofera Blue On the right dorsal first second toe web space dorsally. Culture I did of this area last week grew MRSA there is not an easy oral option in this patient was multiple antibiotic allergies or intolerances. This was only a rare culture isolate I'm therefore going to use Bactroban under silver alginate On the left plantar first second toe web space. Debridement is required here. This is also unchanged 11/17/17; left lateral leg wound continues to contract using Hydrofera Blue this is no longer the major issue. The major concern here is the right first second toe web space. He now has an open area going from dorsally to the plantar aspect. There is now wound on the inner lateral part of the first toe. Not a very viable surface on this. There is erythema spreading medially into the forefoot. No major change in the left first second toe plantar wound 11/24/17; left lateral leg wound continues to contract using Hydrofera Blue. Nice improvement today The right first second toe web space all of this looks a lot less angry than last week. I have given him clindamycin and topical Bactroban for MRSA and terbinafine for the  possibility of underlining tinea pedis that I could not control with ketoconazole. Looks somewhat better The area on the plantar left first second toe web space is weeping with dried debris around the wound 12/01/17; left lateral leg wound continues to contract he Hydrofera Blue. It is becoming thinner in terms of with nevertheless it is making good improvement. The right first second toe web space looks less angry but still a large necrotic-looking wounds starting on the plantar aspect of the right foot extending between the toes and now extensively on the base of the right second toe. I gave him clindamycin and topical Bactroban for MRSA anterior benefiting for the possibility of underlying tinea pedis. Not looking better today The area on the left first/second toe looks better. Debrided of necrotic debris 12/05/17* the patient was worked in urgently today because over the weekend he found blood on his incontinence bad when he woke up. He was found to have an ulcer by his wife who does most of his wound care. He came in today for  [place] to look at this. He has not had a history of wounds in his buttocks in spite of his paraplegia. 12/08/17; seen in follow-up today at his usual appointment. He was seen earlier this week and found to have a new wound on his buttock. We also follow him for wounds on the left lateral leg, left first second toe web space and right first second toe web space 12/15/17; we have been using Hydrofera Blue to the left lateral leg which has improved. The right first second toe web space has also improved. Left first second toe web space plantar aspect looks stable. The left buttock has worsened using Santyl. Apparently the buttock has drainage 12/22/17; we have been using Hydrofera Blue to the left lateral leg which continues to improve now 2 small wounds separated by normal skin. He tells [place] he had a fever up to 100 yesterday he is prone to UTIs but has not noted anything  different. He does in and out catheterizations. The area between the first and second toes today does not look good necrotic surface covered with what looks to be purulent drainage and erythema extending into the third toe. I had gotten this to something that I thought look better last time however it is not look good today. He also has a necrotic surface over the buttock wound which is expanded. I thought there might be infection under here so I removed a lot of the surface with a #5 curet though nothing look like it really needed culturing. He is been using Santyl to this area 12/27/17; his original wound on the left lateral leg continues to improve using Hydrofera Blue. I gave him samples of Baxdella although he was unable to take them out of fear for an allergic reaction ["lump in his throat"].the culture I did of the purulent drainage from his second toe last week showed both enterococcus and a set Enterobacter I was also concerned about the erythema on the bottom of his foot although paradoxically although this looks somewhat better today. 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 We've been using silver alginate here. They came up today that he sleeps in a recliner, I'm not sure why but I asked him to stop this 01/03/18; his original wound we've been using Hydrofera Blue is now separated into 2 areas. Ulcer on his left buttock is better he is off the recliner and sleeping in bed Finally both wound areas between his first and second toes also looks some better 01/10/18; his original wound on the left lateral leg is now separated into 2 wounds we've been using Hydrofera Blue Ulcer on his left buttock has some drainage. There is a small probing site going into muscle layer superiorly.using silver alginate -He arrives today with a deep tissue injury on the left heel The wound on the dorsal aspect of  his first second toe on the left looks a lot betterusing silver alginate ketoconazole The area on the first second toe web space on the right also looks a lot bette 01/17/18; his original wound on the left lateral leg continues to progress using Hydrofera Blue Ulcer on his left buttock also is smaller surface healthier except for a small probing site going into the muscle layer superiorly. 2.4 cm of tunneling in this area DTI on his left heel we have only been offloading. Looks better than last week no threatened open no evidence of  infection the wound on the dorsal aspect of the first second toe on the left continues to look like it's regressing we have only been using silver alginate and terbinafine orally The area in the first second toe web space on the right also looks to be a lot better using silver alginate and terbinafine I think this was prompted by tinea pedis 01/31/18; the patient was hospitalized in [place] last week apparently for a complicated UTI. He was discharged on cefepime he does in and out catheterizations. In the hospital he was discovered M I don't mild elevation of AST and ALTs and the terbinafine was stopped.predictably the pressure ulcer on his buttock looks betterusing silver alginate. The area on the left lateral leg also is better using Hydrofera Blue. The area between the first and second toes on the left better. First and second toes on the right still substantial but better. Finally the DTI on the left heel has held together and looks like it's resolving 02/07/18-he is here in follow-up evaluation for multiple ulcerations. He has new injury to the lateral aspect of the last issue a pressure ulcer, he states this is from adhesive removal trauma. He states he has tried multiple adhesive products with no success. All other ulcers appear stable. The left heel DTI is resolving. We will continue with same treatment plan and follow-up next week. 02/14/18; follow-up for  multiple areas. He has a new area last week on the lateral aspect of his pressure ulcer more over the posterior trochanter. The original pressure ulcer looks quite stable has healthy granulation. 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 Almost fully epithelialized on the original superior area using Hydrofera Blue DTI on the left heel has peeled off this week to reveal a small superficial wound under denuded skin and subcutaneous tissue Both areas between the first and second toes look better including nothing open on the left 02/21/18; The patient's wounds on his left ischial tuberosity and posterior left greater trochanter actually looked better. He has a large area of irritation around the area which I think is contact dermatitis. I am doubtful that this is fungal His original wound on the left lateral calf continues to improve we have been using Hydrofera Blue There is no open area in the left first second toe web space although there is a lot of thick callus The DTI on the left heel required debridement today of necrotic surface eschar and subcutaneous tissue using silver alginate Finally the area on the right first second toe webspace continues to contract using silver alginate and ketoconazole 02/28/18 Left ischial tuberosity wounds look better using silver alginate. Original wound on the left calf only has one small open area left using Hydrofera Blue DTI on the left heel required debridement mostly removing skin from around this wound surface. Using silver alginate The areas on the right first/second toe web space using silver alginate and ketoconazole 03/08/18 on evaluation today patient appears to be doing decently well as best I can tell in regard to his wounds. This is the first time that I have seen him as he generally is followed by Dr. Dellia Nims. With that being said none of  his wounds appear to be infected he does have an area where there is some skin covering what appears to be a new wound on the left dorsal surface of his great toe. This is right at the nail  bed. With that being said I do believe that debrided away some of the excess skin can be of benefit in this regard. Otherwise he has been tolerating the dressing changes without complication. 03/14/18; patient arrives today with the multiplicity of wounds that we are following. He has not been systemically unwell Original wound on the left lateral calf now only has 2 small open areas we've been using Hydrofera Blue which should continue The deep tissue injury on the left heel requires debridement today. We've been using silver alginate The left first second toe and the right first second toe are both are reminiscence what I think was tinea pedis. Apparently some of the callus Surface between the toes was removed last week when it started draining. Purulent drainage coming from the wound on the ischial tuberosity on the left. 03/21/18-He is here in follow-up evaluation for multiple wounds. 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 He tolerated debridement. The only change to last week's recommendations is to discontinue antifungal cream between toes. He will follow-up next week 03/28/18; following up for multiple wounds;Concern this week is streaking redness and swelling in the right foot. He is going to need antibiotics for this. 03/31/18; follow-up for right foot cellulitis. Streaking redness and swelling in the right foot on 03/28/18. 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 I put him on clindamycin 300 mg every 6 and brought him in for a quick check. He has an open wound between his first and second toes on the right foot as a potential source. 04/04/18; Right foot cellulitis is resolving he is completing  clindamycin. This is truly good news Left lateral calf wound which is initial wound only has one small open area inferiorly this is close to healing out. He has compression stockings. We will use Hydrofera Blue right down to the epithelialization of this Nonviable surface on the left heel which was initially pressure with a DTI. We've been using Hydrofera Blue. I'm going to switch this back to silver alginate Left first second toe/tinea pedis this looks better using silver alginate Right first second toe tinea pedis using silver alginate Large pressure ulcers on theLeft ischial tuberosity. Small wound here Looks better. I am uncertain about the surface over the large wound. Using silver alginate 04/11/18; Cellulitis in the right foot is resolved Left lateral calf wound which was his original wounds still has 2 tiny open areas remaining this is just about closed Nonviable surface on the left heel is better but still requires debridement Left first second toe/tinea pedis still open using silver alginate Right first second toe wound tinea pedis I asked him to go back to using ketoconazole and silver alginate Large pressure ulcers on the left ischial tuberosity this shear injury here is resolved. Wound is smaller. No evidence of infection using silver alginate 04/18/18; Patient arrives with an intense area of cellulitis in the right mid lower calf extending into the right heel area. Bright red and warm. Smaller area on the left anterior leg. He has a significant history of MRSA. He will definitely need antibioticsdoxycycline He now has 2 open areas on the left ischial tuberosity the original large wound and now a satellite area which I think was above his initial satellite areas. Not a wonderful surface on this satellite area surrounding erythema which looks like pressure related. His left lateral calf wound again his original wound is just about closed Left heel pressure injury still  requiring  debridement Left first second toe looks a lot better using silver alginate Right first second toe also using silver alginate and ketoconazole cream also looks better 04/20/18; the patient was worked in early today out of concerns with his cellulitis on the right leg. I had started him on doxycycline. This was 2 days ago. His wife was concerned about the swelling in the area. Also concerned about the left buttock. He has not been systemically unwell no fever chills. No nausea vomiting or diarrhea 04/25/18; the patient's left buttock wound is continued to deteriorate he is using Hydrofera Blue. He is still completing clindamycin for the cellulitis on the right leg although all of this looks better. 05/02/18 Left buttock wound still with a lot of drainage and a very tightly adherent fibrinous necrotic surface. He has a deeper area superiorly The left lateral calf wound is still closed DTI wound on the left heel necrotic surface especially the circumference using Iodoflex Areas between his left first second toe and right first second toe both look better. Dorsally and the right first second toe he had a necrotic surface although at smaller. In using silver alginate and ketoconazole. I did a culture last week which was a deep tissue culture of the reminiscence of the open wound on the right first second toe dorsally. This grew a few Acinetobacter and a few methicillin-resistant staph aureus. Nevertheless the area actually this week looked better. I didn't feel the need to specifically address this at least in terms of systemic antibiotics. 05/09/18; wounds are measuring larger more drainage per our intake. We are using Santyl covered with alginate on the large superficial buttock wounds, Iodosorb on the left heel, ketoconazole and silver alginate to the dorsal first and second toes bilaterally. 05/16/18; The area on his left buttock better in some aspects although the area superiorly over the ischial  tuberosity required an extensive debridement.using Santyl Left heel appears stable. Using Iodoflex The areas between his first and second toes are not bad however there is spreading erythema up the dorsal aspect of his left foot this looks like cellulitis again. He is insensate the erythema is really very brilliant.o Erysipelas He went to [name] an allergist days ago because he was itching part of this he had lab work done. This showed a white count of 15.1 with 70% neutrophils. Hemoglobin of 11.4 and a platelet count of 659,000. Last white count we had in Epic was a 2-1/2 years ago which was 25.9 but he was ill at the time. He was able to show me some lab work that was done by his primary physician the pattern is about the same. 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 But prompted me to go ahead and do x-rays of both feet and the pelvis rule out osteomyelitis. He also had a comprehensive metabolic panel this was reasonably normal his albumin was 3.7 liver function tests BUN/creatinine all normal 05/23/18; x-rays of both his feet from last week were negative for underlying pulmonary abnormality. The x-ray of his pelvis however showed mild irregularity in the left ischial which may represent some early osteomyelitis. The wound in the left ischial continues to get deeper clearly now exposed muscle. Each week necrotic surface material over this area. Whereas the rest of the wounds do not look so bad. The left ischial wound we have been using Santyl and calcium alginate To the left heel surface necrotic debris using Iodoflex The left  lateral leg is still healed Areas on the left dorsal foot and the right dorsal foot are about the same. There is some inflammation on the left which might represent contact dermatitis, fungal dermatitis I am doubtful cellulitis although this looks better than last week 05/30/18; CT scan done at Hospital did not show any  osteomyelitis or abscess. Suggested the possibility of underlying cellulitis although I don't [name] a lot of evidence of this at the bedside The wound itself on the left buttock/upper thigh actually looks somewhat better. No debridement Left heel also looks better no debridement continue Iodoflex Both dorsal first second toe spaces appear better using Lotrisone. Left still required debridement 06/06/18; Intake reported some purulent looking drainage from the left gluteal wound. Using Santyl and calcium alginate Left heel looks better although still a nonviable surface requiring debridement The left dorsal foot first/second webspace actually expanding and somewhat deeper. I may consider doing a shave biopsy of this area Right dorsal foot first/second webspace appears stable to improved. Using Lotrisone and silver alginate to both these areas 06/13/18 Left gluteal surface looks better. Now separated in the 2 wounds. No debridement required. Still drainage. We'll continue silver alginate Left heel continues to look better with Iodoflex continue this for at least another week Of his dorsal foot wounds the area on the left still has some depth although it looks better than last week. We've been using Lotrisone and silver alginate 06/20/18 Left gluteal continues to look better healthy tissue Left heel continues to look better healthy granulation wound is smaller. He is using Iodoflex and his long as this continues continue the Iodoflex Dorsal right foot looks better unfortunately dorsal left foot does not. There is swelling and erythema of his forefoot. He had minor trauma to this several days ago but doesn't think this was enough to have caused any tissue injury. Foot looks like cellulitis, we have had this problem before 06/27/18 on evaluation today patient appears to be doing a little worse in regard to his foot ulcer. 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 Both of which would really be his only options for treating this infection. In the past he has been given and effusion of Orbactiv. This is done very well for him in the past again it's one time dosing IV antibiotic therapy. Subsequently I do believe this is something we're gonna need to [name] about doing at this point in time. Currently his other wounds seem to be doing somewhat better in my pinion I'm pretty happy in that regard. 07/03/18 on evaluation today patient's wounds actually appear to be doing fairly well. He has been tolerating the dressing changes without complication. All in all he seems to be showing signs of improvement. In regard to the antibiotics he has been dealing with infectious disease since I saw him last week as far as getting this scheduled. In the end [facility]'s [facility] this coming Friday. In the meantime he has been continuing to perform the dressing changes in such as previous. There does not appear to be any evidence of infection worsengin at this time. 07/10/18; Since I last saw this man 2 weeks ago things have actually improved. IV antibiotics of resulted in less forefoot erythema although there is still some present. He is not systemically unwell  Left buttock wounds 2 now have no depth there is increased epithelialization Using silver alginate Left heel still requires debridement using Iodoflex Left dorsal foot still with a sizable wound about the size of a border but healthy granulation Right dorsal foot still with a slitlike area using silver alginate 07/18/18; the patient's cellulitis in the left foot is improved in fact I think it is on its way to resolving. Left buttock wounds 2 both look better although the larger one has hypertension granulation we've been using silver alginate Left heel has some thick  circumferential redundant skin over the wound edge which will need to be removed today we've been using Iodoflex Left dorsal foot is still a sizable wound required debridement using silver alginate The right dorsal foot is just about closed only a small open area remains here 07/25/18; left foot cellulitis is resolved Left buttock wounds 2 both look better. Hyper-granulation on the major area Left heel as some debris over the surface but otherwise looks a healthier wound. Using silver collagen Right dorsal foot is just about closed 07/31/18; arrives with our intake nurse worried about purulent drainage from the buttock. We had hyper-granulation here last week His buttock wounds 2 continue to look better Left heel some debris over the surface but measuring smaller. Right dorsal foot unfortunately has openings between the toes Left foot superficial wound looks less aggravated. 08/07/18 Buttock wounds continue to look better although some of her granulation and the larger medial wound. silver alginate Left heel continues to look a lot better.silver collagen Left foot superficial wound looks less stable. Requires debridement. He has a new wound superficial area on the foot on the lateral dorsal foot. Right foot looks better using silver alginate without Lotrisone 08/14/2018; patient was in the ER last week diagnosed with a UTI. He is now on Cefpodoxime and Macrodantin. Buttock wounds continued to be smaller. Using silver alginate Left heel continues to look better using silver collagen Left foot superficial wound looks as though it is improving Right dorsal foot area is just about healed. 08/21/2018; patient is completed his antibiotics for his UTI. He has 2 open areas on the buttocks. There is still not closed although the surface looks satisfactory. Using silver alginate Left heel continues to improve using silver collagen The bilateral dorsal foot areas which are at the base of his first and  second toes/possible tinea pedis are actually stable on the left but worse on the right. The area on the left required debridement of necrotic surface. After debridement I obtained a specimen for PCR culture. The right dorsal foot which is been just about healed last week is now reopened 08/28/2018; culture done on the left dorsal foot showed coag negative staph both staph epidermidis and Lugdunensis. I think this is worthwhile initiating systemic treatment. I will use doxycycline given his long list of allergies. The area on the left heel slightly improved but still requiring debridement. The large wound on the buttock is just about closed whereas the smaller one is larger. Using silver alginate in this area 09/04/2018; patient is completing his doxycycline for the left foot although this continues to be a very difficult wound area with very adherent necrotic debris. We are using silver alginate to all his wounds right foot left foot and the small wounds on his buttock, silver collagen on the left heel. 09/11/2018; once again this patient has intense erythema and swelling of the left forefoot. Lesser degrees of erythema in the right foot. He has a  long list of allergies and intolerances. I will reinstitute doxycycline. 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 Using silver alginate Left heel also looks better using silver collagen Unfortunately both the areas on his feet look worse. The area on the left first second webspace is now gone through to the plantar part of his foot. The area on the left foot anteriorly is irritated with erythema and swelling in the forefoot. 09/25/2018 His wound on the left plantar heel looks better. Using silver collagen The area on the left buttock 2 small remnant areas. One is closed one is still open. Using silver alginate The areas between both his first and second toes look worse. This in spite of long-standing  antifungal therapy with ketoconazole and silver alginate which should have antifungal activity He has small areas around his original wound on the left calf one is on the bottom of the original scar tissue and one superiorly both of these are small and superficial but again given wound history in this site this is worrisome 10/02/2018 Left plantar heel continues to gradually contract using silver collagen Left buttock wound is unchanged using silver alginate The areas on his dorsal feet between his first and second toes bilaterally look about the same. I prescribed clindamycin ointment to [name] if we can address chronic staph colonization and also the underlying possibility of erythrasma The left lateral lower extremity wound is actually on the lateral part of his ankle. Small open area here. We have been using silver alginate 10/09/2018; Left plantar heel continues to look healthy and contract. No debridement is required Left buttock slightly smaller with a tape injury wound just below which was new this week Dorsal feet somewhat improved I have been using clindamycin Left lateral looks lower extremity the actual open area looks worse although a lot of this is epithelialized. I am going to change to silver collagen today He has a lot more swelling in the right leg although this is not pitting not red and not particularly warm there is a lot of spasm in the right leg usually indicative of people with paralysis of some underlying discomfort. We have reviewed his vascular status from 2017 he had a left greater saphenous vein ablation. I wonder about referring him back to vascular surgery if the area on the left leg continues to deteriorate. 10/16/2018 in today for follow-up and management of multiple lower extremity ulcers. His left Buttock wound is much lower smaller and almost closed completely. The wound to the left ankle has began to reopen with Epithelialization and some adherent slough. He  has multiple new areas to the left foot and leg. The left dorsal foot without much improvement. Wound present between left great webspace and 2nd toe. Erythema and edema present right leg. Right LE ultrasound obtained on 10/10/18 was negative for DVT. 10/23/2018; Left buttock is closed over. Still dry macerated skin but there is no open wound. I suspect this is chronic pressure/moisture Left lateral calf is quite a bit worse than when I saw this last. There is clearly drainage here he has macerated skin into the left plantar heel. We will change the primary dressing to alginate Left dorsal foot has some improvement in overall wound area. Still using clindamycin and silver alginate Right dorsal foot about the same as the left using clindamycin and silver alginate The erythema in the right leg has resolved. He is DVT rule out was negative Left heel  pressure area required debridement although the wound is smaller and the surface is health 10/26/2018 The patient came back in for his nurse check today predominantly because of the drainage coming out of the left lateral leg with a recent reopening of his original wound on the left lateral calf. He comes in today with a large amount of surrounding erythema around the wound extending from the calf into the ankle and even in the area on the dorsal foot. He is not systemically unwell. He is not febrile. Nevertheless this looks like cellulitis. We have been using silver alginate to the area. I changed him to a regular visit and I am going to prescribe him doxycycline. The rationale here is a long list of medication intolerances and a history of MRSA. I did not [name] anything that I thought would provide a valuable culture 10/30/2018 Follow-up from his appointment 4 days ago with really an extensive area of cellulitis in the left calf left lateral ankle and left dorsal foot. I put him on doxycycline. He has a long list of medication allergies which are true  allergy reactions. 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 In any case he is a lot better today. The erythema especially in the anterior and lateral left calf is better. He still has left ankle erythema. He also is complaining about increasing edema in the right leg we have only been using Kerlix Coban and he has been doing the wraps at home. Finally he has a spotty rash on the medial part of his upper left calf which looks like folliculitis or perhaps wrap occlusion type injury. Small superficial macules not pustules 11/06/18 patient arrives today with again a considerable degree of erythema around the wound on the left lateral calf extending into the dorsal ankle and dorsal foot. This is a lot worse than when I saw this last week. He is on doxycycline really with not a lot of improvement. He has not been systemically unwell Wounds on the; left heel actually looks improved. Original area on the left foot and proximity to the first and second toes looks about the same. He has superficial areas on the dorsal foot, anterior calf and then the reopening of his original wound on the left lateral calf which looks about the same The only area he has on the right is the dorsal webspace first and second which is smaller. He has a large area of dry erythematous skin on the left buttock small open area here. 11/13/2018; the patient arrives in much better condition. The erythema around the wound on the left lateral calf is a lot better. Not sure whether this was the clindamycin or the TCA and ketoconazole or just in the improvement in edema control [stasis dermatitis]. In any case this is a lot better. The area on the left heel is very small and just about resolved using silver collagen we have been using silver alginate to the areas on his dorsal feet 11/20/2018; his wounds include the left lateral calf, left heel, dorsal aspects of  both feet just proximal to the first second webspace. He is stable to slightly improved. I did not think any changes to his dressings were going to be necessary 11/27/2018 he has a reopening on the left buttock which is surrounded by what looks like tinea or perhaps some other form of dermatitis. The area on the left dorsal foot has some erythema around it I have  marked this area but I am not sure whether this is cellulitis or not. Left heel is not closed. Left calf the reopening is really slightly longer and probably worse 1/13; in general things look better and smaller except for the left dorsal foot. Area on the left heel is just about closed, left buttock looks better only a small wound remains in the skin looks better [using Lotrisone] 1/20; the area on the left heel only has a few remaining open areas here. Left lateral calf about the same in terms of size, left dorsal foot slightly larger right lateral foot still not closed. The area on the left buttock has no open wound and the surrounding skin looks a lot better 1/27; the area on the left heel is closed. Left lateral calf better but still requiring extensive debridements. The area on his left buttock is closed. He still has the open areas on the left dorsal foot which is slightly smaller in the right foot which is slightly expanded. We have been using Iodoflex on these areas as well 2/3; left heel is closed. Left lateral calf still requiring debridement using Iodoflex there is no open area on his left buttock however he has dry scaly skin over a large area of this. Not really responding well to the Lotrisone. Finally the areas on his dorsal feet at the level of the first second webspace are slightly smaller on the right and about the same on the left. Both of these vigorously debrided with Anasept and gauze 2/10; left heel remains closed he has dry erythematous skin over the left buttock but there is no open wound here. Left lateral leg  has come in and with. Still requiring debridement we have been using Iodoflex here. Finally the area on the left dorsal foot and right dorsal foot are really about the same extremely dry callused fissured areas. He does not yet have a dermatology appointment 2/17; left heel remains closed. He has a new open area on the left buttock. The area on the left lateral calf is bigger longer and still covered in necrotic debris. No major change in his foot areas bilaterally. I am awaiting for a dermatologist to look on this. We have been using ketoconazole I do not know that this is been doing any good at all. 2/24; left heel remains closed. The left buttock wound that was new reopening last week looks better. The left lateral calf appears better also although still requires debridement. The major area on his foot is the left first second also requiring debridement. We have been putting Prisma on all wounds. I do not believe that the ketoconazole has done too much good for his feet. He will use Lotrisone I am going to give him a 2-week course of terbinafine. We still do not have a dermatology appointment 3/2 left heel remains closed however there is skin over bone in this area I pointed this out to him today. The left buttock wound is epithelialized but still does not look completely stable. The area on the left leg required debridement were using silver collagen here. With regards to his feet we changed to Lotrisone last week and silver alginate. 3/9; left heel remains closed. Left buttock remains closed. The area on the right foot is essentially closed. The left foot remains unchanged. Slightly smaller on the left lateral calf. Using silver collagen to both of these areas 3/16-Left heel remains closed. Area on right foot is closed. Left lateral calf above the lateral malleolus  open wound requiring debridement with easy bleeding. Left dorsal wound proximal to first toe also debrided. Left ischial  area open new. Patient has been using Prisma with wrapping every 3 days. Dermatology appointment is apparently tomorrow.Patient has completed his terbinafine 2-week course with some apparent improvement according to him, there is still flaking and dry skin in his foot on the left 3/23; area on the right foot is reopened. The area on the left anterior foot is about the same still a very necrotic adherent surface. He still has the area on the left leg and reopening is on the left buttock. He apparently saw dermatology although I do not have a note. According to the patient who is usually fairly well informed they did not have any good ideas. Put him on oral terbinafine which she is been on before. 3/30; using silver collagen to all wounds. Apparently his dermatologist put him on doxycycline and rifampin presumably some culture grew staph. I do not have this result. He remains on terbinafine although I have used terbinafine on him before 4/6; patient has had a fairly substantial reopening on the right foot between the first and second toes. He is finished his terbinafine and I believe is on doxycycline and rifampin still as prescribed by dermatology. We have been using silver collagen to all his wounds although the patient reports that he thinks silver alginate does better on the wounds on his buttock. 4/13; the area on his left lateral calf about the same size but it did not require debridement. Left dorsal foot just proximal to the webspace between the first and second toes is about the same. Still nonviable surface. I note some superficial bronze discoloration of the dorsal part of his foot Right dorsal foot just proximal to the first and second toes also looks about the same. I still think there may be the same discoloration I noted above on the left Left buttock wound looks about the same 4/20; left lateral calf appears to be gradually contracting using silver collagen. He remains on  erythromycin empiric treatment for possible erythrasma involving his digital spaces. The left dorsal foot wound is debrided of tightly adherent necrotic debris and really cleans up quite nicely. The right area is worse with expansion. I did not debride this it is now over the base of the second toe The area on his left buttock is smaller no debridement is required using silver collagen 5/4; left calf continues to make good progress. He arrives with erythema around the wounds on his dorsal foot which even extends to the plantar aspect. Very concerning for coexistent infection. He is finished the erythromycin I gave him for possible erythrasma this does not seem to have helped. The area on the left foot is about the same base of the dorsal toes Is area on the buttock looks improved on the left 5/11; left calf and left buttock continued to make good progress. Left foot is about the same to slightly improved. Major problem is on the right foot. He has not had an x-ray. Deep tissue culture I did last week showed both Enterobacter and E. coli. I did not change the doxycycline I put him on empirically although neither 1 of these were plated to doxycycline. He arrives today with the erythema looking worse on both the dorsal and plantar foot. Macerated skin on the bottom of the foot. he has not been systemically unwell 5/18-Patient returns at 1 week, left calf wound appears to be making some progress, left  buttock wound appears slightly worse than last time, left foot wound looks slightly better, right foot redness is marginally better. X-ray of both feet show no air or evidence of osteomyelitis. Patient is finished his Omnicef and terbinafine. He continues to have macerated skin on the bottom of the left foot as well as right 5/26; left calf wound is better, left buttock wound appears to have multiple small superficial open areas with surrounding macerated skin. X-rays that I did last time showed no  evidence of osteomyelitis in either foot. He is finished cefdinir and doxycycline. I do not think that he was on terbinafine. He continues to have a large superficial open area on the right foot anterior dorsal and slightly between the first and second toes. I did send him to dermatology 2 months ago or so wondering about whether they would do a fungal scraping. I do not believe they did but did do a culture. We have been using silver alginate to the toe areas, he has been using antifungals at home topically either ketoconazole or Lotrisone. We are using silver collagen on the left foot, silver alginate on the right, silver collagen on the left lateral leg and silver alginate on the left buttock 6/1; left buttock area is healed. We have the left dorsal foot, left lateral leg and right dorsal foot. We are using silver alginate to the areas on both feet and silver collagen to the area on his left lateral calf 6/8; the left buttock apparently reopened late last week. He is not really sure how this happened. He is tolerating the terbinafine. Using silver alginate to all wounds 6/15; left buttock wound is larger than last week but still superficial. Came in the clinic today with a report of purulence from the left lateral leg I did not identify any infection Both areas on his dorsal feet appear to be better. He is tolerating the terbinafine. Using silver alginate to all wounds 6/22; left buttock is about the same this week, left calf quite a bit better. His left foot is about the same however he comes in with erythema and warmth in the right forefoot once again. Culture that I gave him in the beginning of May showed Enterobacter and E. coli. I gave him doxycycline and things seem to improve although neither 1 of these organisms was specifically plated. 6/29; left buttock is larger and dry this week. Left lateral calf looks to me to be improved. Left dorsal foot also somewhat improved right foot  completely unchanged. The erythema on the right foot is still present. He is completing the Ceftin dinner that I gave him empirically [[name] discussion above.) 7/6 - All wounds look to be stable and perhaps improved, the left buttock wound is slightly smaller, per patient bleeds easily, completed ceftin, the right foot redness is less, he is on terbinafine 7/13; left buttock wound about the same perhaps slightly narrower. Area on the left lateral leg continues to narrow. Left dorsal foot slightly smaller right foot about the same. We are using silver alginate on the right foot and Hydrofera Blue to the areas on the left. Unna boot on the left 2 layer compression on the right 7/20; left buttock wound absolutely the same. Area on lateral leg continues to get better. Left dorsal foot require debridement as did the right no major change in the 7/27; left buttock wound the same size necrotic debris over the surface. The area on the lateral leg is closed once again. His left foot  looks better right foot about the same although there is some involvement now of the posterior first second toe area. He is still on terbinafine which I have given him for a month, not certain a centimeter major change 06/25/19-All wounds appear to be slightly improved according to report, left buttock wound looks clean, both foot wounds have minimal to no debris the right dorsal foot has minimal slough. We are using Hydrofera Blue to the left and silver alginate to the right foot and ischial wound. 8/10-Wounds all appear to be around the same, the right forefoot distal part has some redness which was not there before, however the wound looks clean and small. Ischial wound looks about the same with no changes 8/17; his wound on the left lateral calf which was his original chronic venous insufficiency wound remains closed. Since I last saw him the areas on the left dorsal foot right dorsal foot generally appear better but  require debridement. The area on his left initial tuberosity appears somewhat larger to me perhaps hyper granulated and bleeds very easily. We have been using Hydrofera Blue to the left dorsal foot and silver alginate to everything else 8/24; left lateral calf remains closed. The areas on his dorsal feet on the webspace of the first and second toes bilaterally both look better. The area on the left buttock which is the pressure ulcer stage II slightly smaller. I change the dressing to Hydrofera Blue to all areas 8/31; left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 linear wound on the lateral left leg. This does not look to be a lot changed from when I lost saw this. The area on his dorsal left foot looks considerably  better however. 08/02/16; no major change in the substantial area on his left lateral leg since last time. We have been using Hydrofera Blue for a prolonged period of time now. The area on his left foot is also unchanged from last review 07/19/16; the area on his dorsal foot on the left  looks considerably smaller. He is beginning to have significant rims of epithelialization on the lateral left leg wound. This also looks better. 08/05/16; the patient came in for a nurse visit today. Apparently the area on his left lateral leg looks better and it was wrapped. However in general discussion the patient noted a new area on the dorsal aspect of his right second toe. The exact etiology of this is unclear but likely relates to pressure. 08/09/16 really the area on the left lateral leg did not really look that healthy today perhaps slightly larger and measurements. The area on his dorsal right second toe is improved also the left foot wound looks stable to improved 08/16/16; the area on the last lateral leg did not change any of dimensions. Post debridement with a curet the area looked better. Left foot wound improved and the area on the dorsal right second toe is improved 08/23/16; the area on the left lateral leg may be slightly smaller both in terms of length and width. Aggressive debridement with a curette afterwards the tissue appears healthier. Left foot wound appears improved in the area on the dorsal right second toe is improved 08/30/16 patient developed a fever over the weekend and was seen in an urgent care. Felt to have a UTI and put on doxycycline. He has been since changed over the phone to [facility]. After we took off the wrap on his right leg today the leg is swollen warm and erythematous, probably more likely the source of the fever 09/06/16; have been using collagen to the major left leg wound, silver alginate to the area on his anterior foot/toes 09/13/16; the areas on his anterior foot/toes on both sides appear to be virtually closed. Extensive wound on the left lateral leg perhaps slightly narrower but each visit still covered an adherent surface slough 09/16/16 patient was in for his usual Thursday nurse visit however the intake nurse noted significant erythema of  his dorsal right foot. He is also running a low-grade fever and having increasing spasms in the right leg 09/20/16 here for cellulitis involving his right great toes and forefoot. This is a lot better. Still requiring debridement on his left lateral leg. Santyl direct says he needs prior authorization. Therefore his wife cannot change this at home 09/30/16; the patient's extensive area on the left lateral calf and ankle perhaps somewhat better. Using Santyl. The area on the left toes is healed and I think the area on his right dorsal foot is healed as well. There is no cellulitis or venous inflammation involving the right leg. He is going to need compression stockings here. 10/07/16; the patient's extensive wound on the left lateral calf and ankle does not measure any differently however there appears to be less adherent surface slough using Santyl and aggressive weekly debridements 10/21/16; no major change in the area on the left lateral calf. Still the same measurement still very difficult to debridement adherent slough and nonviable subcutaneous tissue. This is not really been helped by several weeks of Santyl. Previously for 2 weeks I used Iodoflex for a short period. A prolonged course of Hydrofera Blue didn't really help. I'm not sure why I only used  2 weeks of Iodoflex on this there is no evidence of surrounding infection. He has a small area on the right second toe which looks as though it's progressing towards closure 10/28/16; the wounds on his toes appear to be closed. No major change in the left lateral leg wound although the surface looks somewhat better using Iodoflex. He has had previous arterial studies that were normal. He has had reflux studies and is status post ablation although I don't have any exact notes on which vein was ablated. I'll need to check the surgical record 11/04/16; he's had a reopening between the first and second toe on the left and right. No major change in the  left lateral leg wound. There is what appears to be cellulitis of the left dorsal foot 11/18/16 the patient was hospitalized initially in [place] and then subsequently transferred to [facility] long and was admitted there from 11/09/16 through 11/12/16. He had developed progressive cellulitis on the right leg in spite of the doxycycline I gave him. I'd spoken to the hospitalist in [place] who was concerned about continuing leukocytosis. CT scan is what I suggested this was done which showed soft tissue swelling without evidence of osteomyelitis or an underlying abscess blood cultures were negative. At [facility] he was treated with vancomycin and Primaxin and then add an infectious disease consult. He was transitioned to Ceftaroline. He has been making progressive improvement. Overall a severe cellulitis of the right leg. He is been using silver alginate to her original wound on the left leg. The wounds in his toes on the right are closed there is a small open area on the base of the left second toe 11/26/15; the patient's right leg is much better although there is still some edema here this could be reminiscent from his severe cellulitis likely on top of some degree of lymphedema. His left anterior leg wound has less surface slough as reported by her intake nurse. Small wound at the base of the left second toe 12/02/16; patient's right leg is better and there is no open wound here. His left anterior lateral leg wound continues to have a healthy-looking surface. Small wound at the base of the left second toe however there is erythema in the left forefoot which is worrisome 12/16/16; is no open wounds on his right leg. We took measurements for stockings. His left anterior lateral leg wound continues to have a healthy-looking surface. I'm not sure where we were with the Apligraf run through his insurance. We have been using Iodoflex. He has a thick eschar on the left first second toe interface, I suspect  this may be fungal however there is no visible open 12/23/16; no open wound on his right leg. He has 2 small areas left of the linear wound that was remaining last week. We have been using Prisma, I thought I have disclosed this week, we can only look forward to next week 01/03/17; the patient had concerning areas of erythema last week, already on doxycycline for UTI through his primary doctor. The erythema is absolutely no better there is warmth and swelling both medially from the left lateral leg wound and also the dorsal left foot. 01/06/17- Patient is here for follow-up evaluation of his left lateral leg ulcer and bilateral feet ulcers. He is on oral antibiotic therapy, tolerating that. Nursing staff and the patient states that the erythema is improved from Monday. 01/13/17; the predominant left lateral leg wound continues to be problematic. I had put Apligraf on him  earlier this month once. However he subsequently developed what appeared to be an intense cellulitis around the left lateral leg wound. I gave him Dalvance I think on 2/12 perhaps 2/13 he continues on cefdinir. The erythema is still present but the warmth and swelling is improved. I am hopeful that the cellulitis part of this control. I wouldn't be surprised if there is an element of venous inflammation as well. 01/17/17. The erythema is present but better in the left leg. His left lateral leg wound still does not have a viable surface buttons certain parts of this long thin wound it appears like there has been improvement in dimensions. 01/20/17; the erythema still present but much better in the left leg. I'm thinking this is his usual degree of chronic venous inflammation. The wound on the left leg looks somewhat better. Is less surface slough 01/27/17; erythema is back to the chronic venous inflammation. The wound on the left leg is somewhat better. I am back to the point where I like to try an Apligraf once again 02/10/17; slight  improvement in wound dimensions. Apligraf #2. He is completing his doxycycline 02/14/17; patient arrives today having completed doxycycline last Thursday. This was supposed to be a nurse visit however once again he hasn't tense erythema from the medial part of his wound extending over the lower leg. Also erythema in his foot this is roughly in the same distribution as last time. He has baseline chronic venous inflammation however this is a lot worse than the baseline I have learned to accept the on him is baseline inflammation 02/24/17- patient is here for follow-up evaluation. He is tolerating compression therapy. His voicing no complaints or concerns he is here anticipating an Apligraf 03/03/17; he arrives today with an adherent necrotic surface. I don't think this is surface is going to be amenable for Apligraf's. The erythema around his wound and on the left dorsal foot has resolved he is off antibiotics 03/10/17; better-looking surface today. I don't think he can tolerate Apligraf's. He tells me he had a wound VAC after a skin graft years ago to this area and they had difficulty with a seal. The erythema continues to be stable around this some degree of chronic venous inflammation but he also has recurrent cellulitis. We have been using Iodoflex 03/17/17; continued improvement in the surface and may be small changes in dimensions. Using Iodoflex which seems the only thing that will control his surface 03/24/17- He is here for follow up evaluation of his LLE lateral ulceration and ulcer to right dorsal foot/toe space. He is voicing no complaints or concerns, He is tolerating compression wrap. 03/31/17 arrives today with a much healthier looking wound on the left lower extremity. We have been using Iodoflex for a prolonged period of time which has for the first time prepared and adequate looking wound bed although we have not had much in the way of wound dimension improvement. He also has a small wound  between the first and second toe on the right 04/07/17; arrives today with a healthy-looking wound bed and at least the top 50% of this wound appears to be now her. No debridement was required I have changed him to [facility] last week after prolonged Iodoflex. He did not do well with Apligraf's. We've had a re-opening between the first and second toe on the right 04/14/17; arrives today with a healthier looking wound bed contractions and the top 50% of this wound and some on the lesser 50%. Wound bed  appears healthy. The area between the first and second toe on the right still remains problematic 04/21/17; continued very gradual improvement. Using [facility] 04/28/17; continued very gradual improvement in the left lateral leg venous insufficiency wound. His periwound erythema is very mild. We have been using Hydrofera Blue. Wound is making progress especially in the superior 50% 05/05/17; he continues to have very gradual improvement in the left lateral venous insufficiency wound. Both in terms with an length rings are improving. I debrided this every 2 weeks with #5 curet and we have been using Hydrofera Blue and again making good progress With regards to the wounds between his right first and second toe which I thought might of been tinea pedis he is not making as much progress very dry scaly skin over the area. Also the area at the base of the left first and second toe in a similar condition 05/12/17; continued gradual improvement in the refractory left lateral venous insufficiency wound on the left. Dimension smaller. Surface still requiring debridement using Hydrofera Blue 05/19/17; continued gradual improvement in the refractory left lateral venous ulceration. Careful inspection of the wound bed underlying rumination suggested some degree of epithelialization over the surface no debridement indicated. Continue Hydrofera Blue difficult areas between his toes first and third on the left  than first and second on the right. I'm going to change to silver alginate from silver collagen. Continue ketoconazole as I suspect underlying tinea pedis 05/26/17; left lateral leg venous insufficiency wound. We've been using Hydrofera Blue. I believe that there is expanding epithelialization over the surface of the wound albeit not coming from the wound circumference. This is a bit of an odd situation in which the epithelialization seems to be coming from the surface of the wound rather than in the exact circumference. There is still small open areas mostly along the lateral margin of the wound. ooHe has unchanged areas between the left first and second and the right first second toes which I been treating for tenia pedis 06/02/17; left lateral leg venous insufficiency wound. We have been using Hydrofera Blue. Somewhat smaller from the wound circumference. The surface of the wound remains a bit on it almost epithelialized sedation in appearance. I use an open curette today debridement in the surface of all of this especially the edges ooSmall open wounds remaining on the dorsal right first and second toe interspace and the plantar left first second toe and her face on the left 06/09/17; wound on the left lateral leg continues to be smaller but very gradual and very dry surface using Hydrofera Blue 06/16/17 requires weekly debridements now on the left lateral leg although this continues to contract. I changed to silver collagen last week because of dryness of the wound bed. Using Iodoflex to the areas on his first and second toes/web space bilaterally 06/24/17; patient with history of paraplegia also chronic venous insufficiency with lymphedema. Has a very difficult wound on the left lateral leg. This has been gradually reducing in terms of with but comes in with a very dry adherent surface. High switch to silver collagen a week or so ago with hydrogel to keep the area moist. This is  been refractory to multiple dressing attempts. He also has areas in his first and second toes bilaterally in the anterior and posterior web space. I had been using Iodoflex here after a prolonged course of silver alginate with ketoconazole was ineffective [question tinea pedis] 07/14/17; patient arrives today with a very difficult adherent material over  his left lateral lower leg wound. He also has surrounding erythema and poorly controlled edema. He was switched his Santyl last visit which the nurses are applying once during his doctor visit and once on a nurse visit. He was also reduced to 2 layer compression I'm not exactly sure of the issue here. 07/21/17; better surface today after 1 week of Iodoflex. Significant cellulitis that we treated last week also better. [Doxycycline] 07/28/17 better surface today with now 2 weeks of Iodoflex. Significant cellulitis treated with doxycycline. He has now completed the doxycycline and he is back to his usual degree of chronic venous inflammation/stasis dermatitis. He reminds me he has had ablations surgery here 08/04/17; continued improvement with Iodoflex to the left lateral leg wound in terms of the surface of the wound although the dimensions are better. 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 Problematic areas on the plantar aspect of the first second toe web space on the left and the dorsal aspect of the first second toe web space on the right. At one point I felt these were probably related to chronic fungal infections in treated him aggressively for this although we have not made any improvement here. 08/11/17; left lateral leg. Surface continues to improve with the Iodoflex although we are not seeing much improvement in overall wound dimensions. Areas on his plantar left foot and right foot show no improvement. In fact the right foot looks somewhat worse 08/18/17; left lateral  leg. We changed to [facility] Blue last week after a prolonged course of Iodoflex which helps get the surface better. It appears that the wound with is improved. Continue with difficult areas on the left dorsal first second and plantar first second on the right 09/01/17; patient arrives in clinic today having had a temperature of 103 yesterday. He was seen in the ER and [facility]. The patient was concerned he could have cellulitis again in the right leg however they diagnosed him with a UTI and he is now on Keflex. He has a history of cellulitis which is been recurrent and difficult but this is been in the left leg, in the past 5 use doxycycline. He does in and out catheterizations at home which are risk factors for UTI 09/08/17; patient will be completing his Keflex this weekend. The erythema on the left leg is considerably better. He has a new wound today on the medial part of the right leg small superficial almost looks like a skin tear. He has worsening of the area on the right dorsal first and second toe. His major area on the left lateral leg is better. Using Hydrofera Blue on all areas 09/15/17; gradual reduction in width on the long wound in the left lateral leg. No debridement required. He also has wounds on the plantar aspect of his left first second toe web space and on the dorsal aspect of the right first second toe web space. 09/22/17; there continues to be very gradual improvements in the dimensions of the left lateral leg wound. He hasn't round erythematous spot with might be pressure on his wheelchair. There is no evidence obviously of infection no purulence no warmth ooHe has a dry scaled area on the plantar aspect of the left first second toe ooImproved area on the dorsal right first second toe. 09/29/17; left lateral leg wound continues to improve in dimensions mostly with an is still a fairly long but increasingly narrow wound. ooHe has a dry scaled  area on the plantar aspect  of his left first second toe web space ooIncreasingly concerning area on the dorsal right first second toe. In fact I am concerned today about possible cellulitis around this wound. The areas extending up his second toe and although there is deformities here almost appears to abut on the nailbed. 10/06/17; left lateral leg wound continues to make very gradual progress. Tissue culture I did from the right first second toe dorsal foot last time grew MRSA and enterococcus which was vancomycin sensitive. This was not sensitive to clindamycin or doxycycline. He is allergic to Zyvox and sulfa we have therefore arrange for him to have dalvance infusion tomorrow. He is had this in the past and tolerated it well 10/20/17; left lateral leg wound continues to make decent progress. This is certainly reduced in terms of with there is advancing epithelialization.ooThe cellulitis in the right foot looks better although he still has a deep wound in the dorsal aspect of the first second toe web space. Plantar left first toe web space on the left I think is making some progress 10/27/17; left lateral leg wound continues to make decent progress. Advancing epithelialization.using Hydrofera Blue ooThe right first second toe web space wound is better-looking using silver alginate ooImprovement in the left plantar first second toe web space. Again using silver alginate 11/03/17 left lateral leg wound continues to make decent progress albeit slowly. Using Hydrofera Blue ooThe right per second toe web space continues to be a very problematic looking punched out wound. I obtained a piece of tissue for deep culture I did extensively treated this for fungus. It is difficult to imagine that this is a pressure area as the patient states other than going outside he doesn't really wear shoes at home ooThe left plantar first second toe web space looked fairly senescent. Necrotic edges. This required debridement oochange to  Hydrofera Blue to all wound areas 11/10/17; left lateral leg wound continues to contract. Using Hydrofera Blue ooOn the right dorsal first second toe web space dorsally. Culture I did of this area last week grew MRSA there is not an easy oral option in this patient was multiple antibiotic allergies or intolerances. This was only a rare culture isolate I'm therefore going to use Bactroban under silver alginate ooOn the left plantar first second toe web space. Debridement is required here. This is also unchanged 11/17/17; left lateral leg wound continues to contract using Hydrofera Blue this is no longer the major issue. ooThe major concern here is the right first second toe web space. He now has an open area going from dorsally to the plantar aspect. There is now wound on the inner lateral part of the first toe. Not a very viable surface on this. There is erythema spreading medially into the forefoot. ooNo major change in the left first second toe plantar wound 11/24/17; left lateral leg wound continues to contract using Hydrofera Blue. Nice improvement today ooThe right first second toe web space all of this looks a lot less angry than last week. I have given him clindamycin and topical Bactroban for MRSA and terbinafine for the possibility of underlining tinea pedis that I could not control with ketoconazole. Looks somewhat better ooThe area on the plantar left first second toe web space is weeping with dried debris around the wound 12/01/17; left lateral leg wound continues to contract he Hydrofera Blue. It is becoming thinner in terms of with nevertheless it is making good improvement. ooThe right first second toe  web space looks less angry but still a large necrotic-looking wounds starting on the plantar aspect of the right foot extending between the toes and now extensively on the base of the right second toe. I gave him clindamycin and topical Bactroban for MRSA anterior benefiting for  the possibility of underlying tinea pedis. Not looking better today ooThe area on the left first/second toe looks better. Debrided of necrotic debris 12/05/17* the patient was worked in urgently today because over the weekend he found blood on his incontinence bad when he woke up. He was found to have an ulcer by his wife who does most of his wound care. He came in today for [place] to look at this. He has not had a history of wounds in his buttocks in spite of his paraplegia. 12/08/17; seen in follow-up today at his usual appointment. He was seen earlier this week and found to have a new wound on his buttock. We also follow him for wounds on the left lateral leg, left first second toe web space and right first second toe web space 12/15/17; we have been using Hydrofera Blue to the left lateral leg which has improved. The right first second toe web space has also improved. Left first second toe web space plantar aspect looks stable. The left buttock has worsened using Santyl. Apparently the buttock has drainage 12/22/17; we have been using Hydrofera Blue to the left lateral leg which continues to improve now 2 small wounds separated by normal skin. He tells [place] he had a fever up to 100 yesterday he is prone to UTIs but has not noted anything different. He does in and out catheterizations. The area between the first and second toes today does not look good necrotic surface covered with what looks to be purulent drainage and erythema extending into the third toe. I had gotten this to something that I thought look better last time however it is not look good today. He also has a necrotic surface over the buttock wound which is expanded. I thought there might be infection under here so I removed a lot of the surface with a #5 curet though nothing look like it really needed culturing. He is been using Santyl to this area 12/27/17; his original wound on the left lateral leg continues to improve using Hydrofera  Blue. I gave him samples of Baxdella although he was unable to take them out of fear for an allergic reaction ["lump in his throat"].the culture I did of the purulent drainage from his second toe last week showed both enterococcus and a set Enterobacter I was also concerned about the erythema on the bottom of his foot although paradoxically although this looks somewhat better today. 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 We've been using silver alginate here. They came up today that he sleeps in a recliner, I'm not sure why but I asked him to stop this 01/03/18; his original wound we've been using Hydrofera Blue is now separated into 2 areas. ooUlcer on his left buttock is better he is off the recliner and sleeping in bed ooFinally both wound areas between his first and second toes also looks some better 01/10/18; his original wound on the left lateral leg is now separated into 2 wounds we've been using Hydrofera Blue ooUlcer on his left buttock has some drainage. There is a small probing site going into muscle layer superiorly.using silver alginate -  He arrives today with a deep tissue injury on the left heel ooThe wound on the dorsal aspect of his first second toe on the left looks a lot betterusing silver alginate ketoconazole ooThe area on the first second toe web space on the right also looks a lot bette 01/17/18; his original wound on the left lateral leg continues to progress using Hydrofera Blue ooUlcer on his left buttock also is smaller surface healthier except for a small probing site going into the muscle layer superiorly. 2.4 cm of tunneling in this area ooDTI on his left heel we have only been offloading. Looks better than last week no threatened open no evidence of infection oothe wound on the dorsal aspect of the first second toe on the left continues to look like it's  regressing we have only been using silver alginate and terbinafine orally ooThe area in the first second toe web space on the right also looks to be a lot better using silver alginate and terbinafine I think this was prompted by tinea pedis 01/31/18; the patient was hospitalized in [place] last week apparently for a complicated UTI. He was discharged on cefepime he does in and out catheterizations. In the hospital he was discovered M I don't mild elevation of AST and ALTs and the terbinafine was stopped.predictably the pressure ulcer on his buttock looks betterusing silver alginate. The area on the left lateral leg also is better using Hydrofera Blue. The area between the first and second toes on the left better. First and second toes on the right still substantial but better. Finally the DTI on the left heel has held together and looks like it's resolving 02/07/18-he is here in follow-up evaluation for multiple ulcerations. He has new injury to the lateral aspect of the last issue a pressure ulcer, he states this is from adhesive removal trauma. He states he has tried multiple adhesive products with no success. All other ulcers appear stable. The left heel DTI is resolving. We will continue with same treatment plan and follow-up next week. 02/14/18; follow-up for multiple areas. ooHe has a new area last week on the lateral aspect of his pressure ulcer more over the posterior trochanter. The original pressure ulcer looks quite stable has healthy granulation. 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 Almost fully epithelialized on the original superior area using Hydrofera Blue ooDTI on the left heel has peeled off this week to reveal a small superficial wound under denuded skin and subcutaneous tissue ooBoth areas between the first and second toes look better including nothing open on  the left 02/21/18; ooThe patient's wounds on his left ischial tuberosity and posterior left greater trochanter actually looked better. He has a large area of irritation around the area which I think is contact dermatitis. I am doubtful that this is fungal ooHis original wound on the left lateral calf continues to improve we have been using Hydrofera Blue ooThere is no open area in the left first second toe web space although there is a lot of thick callus ooThe DTI on the left heel required debridement today of necrotic surface eschar and subcutaneous tissue using silver alginate ooFinally the area on the right first second toe webspace continues to contract using silver alginate and ketoconazole 02/28/18 ooLeft ischial tuberosity wounds look better using silver alginate. ooOriginal wound on the left calf only has one small open area left using Hydrofera Blue ooDTI  on the left heel required debridement mostly removing skin from around this wound surface. Using silver alginate ooThe areas on the right first/second toe web space using silver alginate and ketoconazole 03/08/18 on evaluation today patient appears to be doing decently well as best I can tell in regard to his wounds. This is the first time that I have seen him as he generally is followed by Dr. Dellia Nims. With that being said none of his wounds appear to be infected he does have an area where there is some skin covering what appears to be a new wound on the left dorsal surface of his great toe. This is right at the nail bed. With that being said I do believe that debrided away some of the excess skin can be of benefit in this regard. Otherwise he has been tolerating the dressing changes without complication. 03/14/18; patient arrives today with the multiplicity of wounds that we are following. He has not been systemically unwell ooOriginal wound on the left lateral calf now only has 2 small open areas we've been using Hydrofera  Blue which should continue ooThe deep tissue injury on the left heel requires debridement today. We've been using silver alginate ooThe left first second toe and the right first second toe are both are reminiscence what I think was tinea pedis. Apparently some of the callus Surface between the toes was removed last week when it started draining. ooPurulent drainage coming from the wound on the ischial tuberosity on the left. 03/21/18-He is here in follow-up evaluation for multiple wounds. 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 He tolerated debridement. The only change to last week's recommendations is to discontinue antifungal cream between toes. He will follow-up next week 03/28/18; following up for multiple wounds;Concern this week is streaking redness and swelling in the right foot. He is going to need antibiotics for this. 03/31/18; follow-up for right foot cellulitis. Streaking redness and swelling in the right foot on 03/28/18. 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 I put him on clindamycin 300 mg every 6 and brought him in for a quick check. He has an open wound between his first and second toes on the right foot as a potential source. 04/04/18; ooRight foot cellulitis is resolving he is completing clindamycin. This is truly good news ooLeft lateral calf wound which is initial wound only has one small open area inferiorly this is close to healing out. He has compression stockings. We will use Hydrofera Blue right down to the epithelialization of this ooNonviable surface on the left heel which was initially pressure with a DTI. We've been using Hydrofera Blue. I'm going to switch this back to silver alginate ooLeft first second toe/tinea pedis this looks better using silver alginate ooRight first second toe tinea pedis using silver alginate ooLarge pressure ulcers on theLeft ischial  tuberosity. Small wound here Looks better. I am uncertain about the surface over the large wound. Using silver alginate 04/11/18; ooCellulitis in the right foot is resolved ooLeft lateral calf wound which was his original wounds still has 2 tiny open areas remaining this is just about closed ooNonviable surface on the left heel is better but still requires debridement ooLeft first second toe/tinea pedis still open using silver alginate ooRight first second toe wound tinea pedis I asked him to go back to using ketoconazole and silver alginate ooLarge pressure ulcers on the left ischial tuberosity this shear injury here is resolved.  Wound is smaller. No evidence of infection using silver alginate 04/18/18; ooPatient arrives with an intense area of cellulitis in the right mid lower calf extending into the right heel area. Bright red and warm. Smaller area on the left anterior leg. He has a significant history of MRSA. He will definitely need antibioticsoodoxycycline ooHe now has 2 open areas on the left ischial tuberosity the original large wound and now a satellite area which I think was above his initial satellite areas. Not a wonderful surface on this satellite area surrounding erythema which looks like pressure related. ooHis left lateral calf wound again his original wound is just about closed ooLeft heel pressure injury still requiring debridement ooLeft first second toe looks a lot better using silver alginate ooRight first second toe also using silver alginate and ketoconazole cream also looks better 04/20/18; the patient was worked in early today out of concerns with his cellulitis on the right leg. I had started him on doxycycline. This was 2 days ago. His wife was concerned about the swelling in the area. Also concerned about the left buttock. He has not been systemically unwell no fever chills. No nausea vomiting or diarrhea 04/25/18; the patient's left buttock wound is  continued to deteriorate he is using Hydrofera Blue. He is still completing clindamycin for the cellulitis on the right leg although all of this looks better. 05/02/18 ooLeft buttock wound still with a lot of drainage and a very tightly adherent fibrinous necrotic surface. He has a deeper area superiorly ooThe left lateral calf wound is still closed ooDTI wound on the left heel necrotic surface especially the circumference using Iodoflex ooAreas between his left first second toe and right first second toe both look better. Dorsally and the right first second toe he had a necrotic surface although at smaller. In using silver alginate and ketoconazole. I did a culture last week which was a deep tissue culture of the reminiscence of the open wound on the right first second toe dorsally. This grew a few Acinetobacter and a few methicillin-resistant staph aureus. Nevertheless the area actually this week looked better. I didn't feel the need to specifically address this at least in terms of systemic antibiotics. 05/09/18; wounds are measuring larger more drainage per our intake. We are using Santyl covered with alginate on the large superficial buttock wounds, Iodosorb on the left heel, ketoconazole and silver alginate to the dorsal first and second toes bilaterally. 05/16/18; ooThe area on his left buttock better in some aspects although the area superiorly over the ischial tuberosity required an extensive debridement.using Santyl ooLeft heel appears stable. Using Iodoflex ooThe areas between his first and second toes are not bad however there is spreading erythema up the dorsal aspect of his left foot this looks like cellulitis again. He is insensate the erythema is really very brilliant.o Erysipelas He went to [name] allergist days ago because he was itching part of this he had lab work done. This showed a white count of 15.1 with 70% neutrophils. Hemoglobin of 11.4 and a platelet count of  659,000. Last white count we had in Epic was a 2-1/2 years ago which was 25.9 but he was ill at the time. He was able to show me some lab work that was done by his primary physician the pattern is about the same. 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 But prompted me to go ahead and do x-rays of both feet and  the pelvis rule out osteomyelitis. He also had a comprehensive metabolic panel this was reasonably normal his albumin was 3.7 liver function tests BUN/creatinine all normal 05/23/18; x-rays of both his feet from last week were negative for underlying pulmonary abnormality. The x-ray of his pelvis however showed mild irregularity in the left ischial which may represent some early osteomyelitis. The wound in the left ischial continues to get deeper clearly now exposed muscle. Each week necrotic surface material over this area. Whereas the rest of the wounds do not look so bad. ooThe left ischial wound we have been using Santyl and calcium alginate ooTo the left heel surface necrotic debris using Iodoflex ooThe left lateral leg is still healed ooAreas on the left dorsal foot and the right dorsal foot are about the same. There is some inflammation on the left which might represent contact dermatitis, fungal dermatitis I am doubtful cellulitis although this looks better than last week 05/30/18; CT scan done at Hospital did not show any osteomyelitis or abscess. Suggested the possibility of underlying cellulitis although I don't [name] a lot of evidence of this at the bedside ooThe wound itself on the left buttock/upper thigh actually looks somewhat better. No debridement ooLeft heel also looks better no debridement continue Iodoflex ooBoth dorsal first second toe spaces appear better using Lotrisone. Left still required debridement 06/06/18; ooIntake reported some purulent looking drainage from the left gluteal wound. Using Santyl and calcium  alginate ooLeft heel looks better although still a nonviable surface requiring debridement ooThe left dorsal foot first/second webspace actually expanding and somewhat deeper. I may consider doing a shave biopsy of this area ooRight dorsal foot first/second webspace appears stable to improved. Using Lotrisone and silver alginate to both these areas 06/13/18 ooLeft gluteal surface looks better. Now separated in the 2 wounds. No debridement required. Still drainage. We'll continue silver alginate ooLeft heel continues to look better with Iodoflex continue this for at least another week ooOf his dorsal foot wounds the area on the left still has some depth although it looks better than last week. We've been using Lotrisone and silver alginate 06/20/18 ooLeft gluteal continues to look better healthy tissue ooLeft heel continues to look better healthy granulation wound is smaller. He is using Iodoflex and his long as this continues continue the Iodoflex ooDorsal right foot looks better unfortunately dorsal left foot does not. There is swelling and erythema of his forefoot. He had minor trauma to this several days ago but doesn't think this was enough to have caused any tissue injury. Foot looks like cellulitis, we have had this problem before 06/27/18 on evaluation today patient appears to be doing a little worse in regard to his foot ulcer. 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 Both of which would really be his only options for treating this infection. In the past he has been given and effusion of Orbactiv. This is done very well for him in the past again it's one time dosing IV antibiotic therapy. Subsequently I do believe this is something we're gonna need to [name] about doing at this point in time. Currently his other wounds seem to be doing  somewhat better in my pinion I'm pretty happy in that regard. 07/03/18 on evaluation today patient's wounds actually appear to be doing fairly well. He has been tolerating the dressing changes without complication. All in all  he seems to be showing signs of improvement. In regard to the antibiotics he has been dealing with infectious disease since I saw him last week as far as getting this scheduled. In the end [facility]'s [facility] this coming Friday. In the meantime he has been continuing to perform the dressing changes in such as previous. There does not appear to be any evidence of infection worsengin at this time. 07/10/18; ooSince I last saw this man 2 weeks ago things have actually improved. IV antibiotics of resulted in less forefoot erythema although there is still some present. He is not systemically unwell ooLeft buttock wounds o2 now have no depth there is increased epithelialization Using silver alginate ooLeft heel still requires debridement using Iodoflex ooLeft dorsal foot still with a sizable wound about the size of a border but healthy granulation ooRight dorsal foot still with a slitlike area using silver alginate 07/18/18; the patient's cellulitis in the left foot is improved in fact I think it is on its way to resolving. ooLeft buttock wounds o2 both look better although the larger one has hypertension granulation we've been using silver alginate ooLeft heel has some thick circumferential redundant skin over the wound edge which will need to be removed today we've been using Iodoflex ooLeft dorsal foot is still a sizable wound required debridement using silver alginate ooThe right dorsal foot is just about closed only a small open area remains here 07/25/18; left foot cellulitis is resolved ooLeft buttock wounds o2 both look better. Hyper-granulation on the major area ooLeft heel as some debris over the surface  but otherwise looks a healthier wound. Using silver collagen ooRight dorsal foot is just about closed 07/31/18; arrives with our intake nurse worried about purulent drainage from the buttock. We had hyper-granulation here last week ooHis buttock wounds o2 continue to look better ooLeft heel some debris over the surface but measuring smaller. ooRight dorsal foot unfortunately has openings between the toes ooLeft foot superficial wound looks less aggravated. 08/07/18 ooButtock wounds continue to look better although some of her granulation and the larger medial wound. silver alginate ooLeft heel continues to look a lot better.silver collagen ooLeft foot superficial wound looks less stable. Requires debridement. He has a new wound superficial area on the foot on the lateral dorsal foot. ooRight foot looks better using silver alginate without Lotrisone 08/14/2018; patient was in the ER last week diagnosed with a UTI. He is now on Cefpodoxime and Macrodantin. ooButtock wounds continued to be smaller. Using silver alginate ooLeft heel continues to look better using silver collagen ooLeft foot superficial wound looks as though it is improving ooRight dorsal foot area is just about healed. 08/21/2018; patient is completed his antibiotics for his UTI. ooHe has 2 open areas on the buttocks. There is still not closed although the surface looks satisfactory. Using silver alginate ooLeft heel continues to improve using silver collagen ooThe bilateral dorsal foot areas which are at the base of his first and second toes/possible tinea pedis are actually stable on the left but worse on the right. The area on the left required debridement of necrotic surface. After debridement I obtained a specimen for PCR culture. ooThe right dorsal foot which is been just about healed last week is now reopened 08/28/2018; culture done on the left dorsal foot showed coag negative staph both staph epidermidis  and Lugdunensis. I think this is worthwhile initiating systemic treatment. I will use doxycycline  given his long list of allergies. The area on the left heel slightly improved but still requiring debridement. ooThe large wound on the buttock is just about closed whereas the smaller one is larger. Using silver alginate in this area 09/04/2018; patient is completing his doxycycline for the left foot although this continues to be a very difficult wound area with very adherent necrotic debris. We are using silver alginate to all his wounds right foot left foot and the small wounds on his buttock, silver collagen on the left heel. 09/11/2018; once again this patient has intense erythema and swelling of the left forefoot. Lesser degrees of erythema in the right foot. He has a long list of allergies and intolerances. I will reinstitute doxycycline. 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 Using silver alginate ooLeft heel also looks better using silver collagen ooUnfortunately both the areas on his feet look worse. The area on the left first second webspace is now gone through to the plantar part of his foot. The area on the left foot anteriorly is irritated with erythema and swelling in the forefoot. 09/25/2018 ooHis wound on the left plantar heel looks better. Using silver collagen ooThe area on the left buttock 2 small remnant areas. One is closed one is still open. Using silver alginate ooThe areas between both his first and second toes look worse. This in spite of long-standing antifungal therapy with ketoconazole and silver alginate which should have antifungal activity ooHe has small areas around his original wound on the left calf one is on the bottom of the original scar tissue and one superiorly both of these are small and superficial but again given wound history in this site this is worrisome 10/02/2018 ooLeft plantar heel  continues to gradually contract using silver collagen ooLeft buttock wound is unchanged using silver alginate ooThe areas on his dorsal feet between his first and second toes bilaterally look about the same. 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 Small open area here. We have been using silver alginate 10/09/2018; ooLeft plantar heel continues to look healthy and contract. No debridement is required ooLeft buttock slightly smaller with a tape injury wound just below which was new this week ooDorsal feet somewhat improved I have been using clindamycin ooLeft lateral looks lower extremity the actual open area looks worse although a lot of this is epithelialized. I am going to change to silver collagen today He has a lot more swelling in the right leg although this is not pitting not red and not particularly warm there is a lot of spasm in the right leg usually indicative of people with paralysis of some underlying discomfort. We have reviewed his vascular status from 2017 he had a left greater saphenous vein ablation. I wonder about referring him back to vascular surgery if the area on the left leg continues to deteriorate. 10/16/2018 in today for follow-up and management of multiple lower extremity ulcers. His left Buttock wound is much lower smaller and almost closed completely. The wound to the left ankle has began to reopen with Epithelialization and some adherent slough. He has multiple new areas to the left foot and leg. The left dorsal foot without much improvement. Wound present between left great webspace and 2nd toe. Erythema and edema present right leg.  Right LE ultrasound obtained on 10/10/18 was negative for DVT. 10/23/2018; ooLeft buttock is closed over. Still dry macerated skin but there is no  open wound. I suspect this is chronic pressure/moisture ooLeft lateral calf is quite a bit worse than when I saw this last. There is clearly drainage here he has macerated skin into the left plantar heel. We will change the primary dressing to alginate ooLeft dorsal foot has some improvement in overall wound area. Still using clindamycin and silver alginate ooRight dorsal foot about the same as the left using clindamycin and silver alginate ooThe erythema in the right leg has resolved. He is DVT rule out was negative ooLeft heel pressure area required debridement although the wound is smaller and the surface is health 10/26/2018 ooThe patient came back in for his nurse check today predominantly because of the drainage coming out of the left lateral leg with a recent reopening of his original wound on the left lateral calf. He comes in today with a large amount of surrounding erythema around the wound extending from the calf into the ankle and even in the area on the dorsal foot. He is not systemically unwell. He is not febrile. Nevertheless this looks like cellulitis. We have been using silver alginate to the area. I changed him to a regular visit and I am going to prescribe him doxycycline. The rationale here is a long list of medication intolerances and a history of MRSA. I did not [name] anything that I thought would provide a valuable culture 10/30/2018 ooFollow-up from his appointment 4 days ago with really an extensive area of cellulitis in the left calf left lateral ankle and left dorsal foot. I put him on doxycycline. He has a long list of medication allergies which are true allergy reactions. 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 In any case he is a lot better today. The erythema especially in the anterior and lateral left calf is better. He still has left ankle erythema. He also is complaining about increasing  edema in the right leg we have only been using Kerlix Coban and he has been doing the wraps at home. Finally he has a spotty rash on the medial part of his upper left calf which looks like folliculitis or perhaps wrap occlusion type injury. Small superficial macules not pustules 11/06/18 patient arrives today with again a considerable degree of erythema around the wound on the left lateral calf extending into the dorsal ankle and dorsal foot. This is a lot worse than when I saw this last week. He is on doxycycline really with not a lot of improvement. He has not been systemically unwell Wounds on the; left heel actually looks improved. Original area on the left foot and proximity to the first and second toes looks about the same. He has superficial areas on the dorsal foot, anterior calf and then the reopening of his original wound on the left lateral calf which looks about the same ooThe only area he has on the right is the dorsal webspace first and second which is smaller. ooHe has a large area of dry erythematous skin on the left buttock small open area here. 11/13/2018; the patient arrives in much better condition. The erythema around the wound on the left lateral calf is a lot better. Not sure whether this was the clindamycin or the TCA and ketoconazole or just in the improvement in edema control [stasis dermatitis]. In any  case this is a lot better. The area on the left heel is very small and just about resolved using silver collagen we have been using silver alginate to the areas on his dorsal feet 11/20/2018; his wounds include the left lateral calf, left heel, dorsal aspects of both feet just proximal to the first second webspace. He is stable to slightly improved. I did not think any changes to his dressings were going to be necessary 11/27/2018 he has a reopening on the left buttock which is surrounded by what looks like tinea or perhaps some other form of dermatitis. The area on the  left dorsal foot has some erythema around it I have marked this area but I am not sure whether this is cellulitis or not. Left heel is not closed. Left calf the reopening is really slightly longer and probably worse 1/13; in general things look better and smaller except for the left dorsal foot. Area on the left heel is just about closed, left buttock looks better only a small wound remains in the skin looks better [using Lotrisone] 1/20; the area on the left heel only has a few remaining open areas here. Left lateral calf about the same in terms of size, left dorsal foot slightly larger right lateral foot still not closed. The area on the left buttock has no open wound and the surrounding skin looks a lot better 1/27; the area on the left heel is closed. Left lateral calf better but still requiring extensive debridements. The area on his left buttock is closed. He still has the open areas on the left dorsal foot which is slightly smaller in the right foot which is slightly expanded. We have been using Iodoflex on these areas as well 2/3; left heel is closed. Left lateral calf still requiring debridement using Iodoflex there is no open area on his left buttock however he has dry scaly skin over a large area of this. Not really responding well to the Lotrisone. Finally the areas on his dorsal feet at the level of the first second webspace are slightly smaller on the right and about the same on the left. Both of these vigorously debrided with Anasept and gauze 2/10; left heel remains closed he has dry erythematous skin over the left buttock but there is no open wound here. Left lateral leg has come in and with. Still requiring debridement we have been using Iodoflex here. Finally the area on the left dorsal foot and right dorsal foot are really about the same extremely dry callused fissured areas. He does not yet have a dermatology appointment 2/17; left heel remains closed. He has a new open area  on the left buttock. The area on the left lateral calf is bigger longer and still covered in necrotic debris. No major change in his foot areas bilaterally. I am awaiting for a dermatologist to look on this. We have been using ketoconazole I do not know that this is been doing any good at all. 2/24; left heel remains closed. The left buttock wound that was new reopening last week looks better. The left lateral calf appears better also although still requires debridement. The major area on his foot is the left first second also requiring debridement. We have been putting Prisma on all wounds. I do not believe that the ketoconazole has done too much good for his feet. He will use Lotrisone I am going to give him a 2-week course of terbinafine. We still do not have a dermatology appointment  3/2 left heel remains closed however there is skin over bone in this area I pointed this out to him today. The left buttock wound is epithelialized but still does not look completely stable. The area on the left leg required debridement were using silver collagen here. With regards to his feet we changed to Lotrisone last week and silver alginate. 3/9; left heel remains closed. Left buttock remains closed. The area on the right foot is essentially closed. The left foot remains unchanged. Slightly smaller on the left lateral calf. Using silver collagen to both of these areas 3/16-Left heel remains closed. Area on right foot is closed. Left lateral calf above the lateral malleolus open wound requiring debridement with easy bleeding. Left dorsal wound proximal to first toe also debrided. Left ischial area open new. Patient has been using Prisma with wrapping every 3 days. Dermatology appointment is apparently tomorrow.Patient has completed his terbinafine 2-week course with some apparent improvement according to him, there is still flaking and dry skin in his foot on the left 3/23; area on the right foot is  reopened. The area on the left anterior foot is about the same still a very necrotic adherent surface. He still has the area on the left leg and reopening is on the left buttock. He apparently saw dermatology although I do not have a note. According to the patient who is usually fairly well informed they did not have any good ideas. Put him on oral terbinafine which she is been on before. 3/30; using silver collagen to all wounds. Apparently his dermatologist put him on doxycycline and rifampin presumably some culture grew staph. I do not have this result. He remains on terbinafine although I have used terbinafine on him before 4/6; patient has had a fairly substantial reopening on the right foot between the first and second toes. He is finished his terbinafine and I believe is on doxycycline and rifampin still as prescribed by dermatology. We have been using silver collagen to all his wounds although the patient reports that he thinks silver alginate does better on the wounds on his buttock. 4/13; the area on his left lateral calf about the same size but it did not require debridement. ooLeft dorsal foot just proximal to the webspace between the first and second toes is about the same. Still nonviable surface. I note some superficial bronze discoloration of the dorsal part of his foot ooRight dorsal foot just proximal to the first and second toes also looks about the same. I still think there may be the same discoloration I noted above on the left ooLeft buttock wound looks about the same 4/20; left lateral calf appears to be gradually contracting using silver collagen. 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 The left dorsal foot wound is debrided of tightly adherent necrotic debris and really cleans up quite nicely. The right area is worse with expansion. I did not debride this it is now over the base of the second toe ooThe  area on his left buttock is smaller no debridement is required using silver collagen 5/4; left calf continues to make good progress. ooHe arrives with erythema around the wounds on his dorsal foot which even extends to the plantar aspect. Very concerning for coexistent infection. He is finished the erythromycin I gave him for possible erythrasma this does not seem to have helped. ooThe area on the left foot is about the same base of the dorsal toes ooIs area on the  buttock looks improved on the left 5/11; left calf and left buttock continued to make good progress. Left foot is about the same to slightly improved. ooMajor problem is on the right foot. He has not had an x-ray. Deep tissue culture I did last week showed both Enterobacter and E. coli. I did not change the doxycycline I put him on empirically although neither 1 of these were plated to doxycycline. He arrives today with the erythema looking worse on both the dorsal and plantar foot. Macerated skin on the bottom of the foot. he has not been systemically unwell 5/18-Patient returns at 1 week, left calf wound appears to be making some progress, left buttock wound appears slightly worse than last time, left foot wound looks slightly better, right foot redness is marginally better. X-ray of both feet show no air or evidence of osteomyelitis. Patient is finished his Omnicef and terbinafine. He continues to have macerated skin on the bottom of the left foot as well as right 5/26; left calf wound is better, left buttock wound appears to have multiple small superficial open areas with surrounding macerated skin. X-rays that I did last time showed no evidence of osteomyelitis in either foot. He is finished cefdinir and doxycycline. I do not think that he was on terbinafine. He continues to have a large superficial open area on the right foot anterior dorsal and slightly between the first and second toes. I did send him to dermatology 2  months ago or so wondering about whether they would do a fungal scraping. I do not believe they did but did do a culture. We have been using silver alginate to the toe areas, he has been using antifungals at home topically either ketoconazole or Lotrisone. We are using silver collagen on the left foot, silver alginate on the right, silver collagen on the left lateral leg and silver alginate on the left buttock 6/1; left buttock area is healed. We have the left dorsal foot, left lateral leg and right dorsal foot. We are using silver alginate to the areas on both feet and silver collagen to the area on his left lateral calf 6/8; the left buttock apparently reopened late last week. He is not really sure how this happened. He is tolerating the terbinafine. Using silver alginate to all wounds 6/15; left buttock wound is larger than last week but still superficial. ooCame in the clinic today with a report of purulence from the left lateral leg I did not identify any infection ooBoth areas on his dorsal feet appear to be better. He is tolerating the terbinafine. Using silver alginate to all wounds 6/22; left buttock is about the same this week, left calf quite a bit better. His left foot is about the same however he comes in with erythema and warmth in the right forefoot once again. Culture that I gave him in the beginning of May showed Enterobacter and E. coli. I gave him doxycycline and things seem to improve although neither 1 of these organisms was specifically plated. 6/29; left buttock is larger and dry this week. Left lateral calf looks to me to be improved. Left dorsal foot also somewhat improved right foot completely unchanged. The erythema on the right foot is still present. He is completing the Ceftin dinner that I gave him empirically [[name] discussion above.) 7/6 - All wounds look to be stable and perhaps improved, the left buttock wound is slightly smaller, per patient bleeds easily,  completed ceftin, the right foot redness is less,  he is on terbinafine 7/13; left buttock wound about the same perhaps slightly narrower. Area on the left lateral leg continues to narrow. Left dorsal foot slightly smaller right foot about the same. We are using silver alginate on the right foot and Hydrofera Blue to the areas on the left. Unna boot on the left 2 layer compression on the right 7/20; left buttock wound absolutely the same. Area on lateral leg continues to get better. Left dorsal foot require debridement as did the right no major change in the 7/27; left buttock wound the same size necrotic debris over the surface. The area on the lateral leg is closed once again. His left foot looks better right foot about the same although there is some involvement now of the posterior first second toe area. He is still on terbinafine which I have given him for a month, not certain a centimeter major change 06/25/19-All wounds appear to be slightly improved according to report, left buttock wound looks clean, both foot wounds have minimal to no debris the right dorsal foot has minimal slough. We are using Hydrofera Blue to the left and silver alginate to the right foot and ischial wound. 8/10-Wounds all appear to be around the same, the right forefoot distal part has some redness which was not there before, however the wound looks clean and small. Ischial wound looks about the same with no changes 8/17; his wound on the left lateral calf which was his original chronic venous insufficiency wound remains closed. Since I last saw him the areas on the left dorsal foot right dorsal foot generally appear better but require debridement. The area on his left initial tuberosity appears somewhat larger to me perhaps hyper granulated and bleeds very easily. We have been using Hydrofera Blue to the left dorsal foot and silver alginate to everything else 8/24; left lateral calf remains closed. The areas on his  dorsal feet on the webspace of the first and second toes bilaterally both look better. The area on the left buttock which is the pressure ulcer stage II slightly smaller. I change the dressing to Hydrofera Blue to all areas 8/31; left lateral calf remains closed. The area on his dorsal feet bilaterally look better. Using Hydrofera Blue. Still requiring debridement on the left foot. No change in the left buttock pressure ulcers however 9/14; left lateral calf remains closed. Dorsal feet look quite a bit better than 2 weeks ago. Flaking dry skin also a lot better with the ammonium lactate I gave him 2 weeks ago. The area on the left buttock is improved. He states that his Roho cushion developed a leak and he is getting a new one, in the interim he is offloading this vigorously 9/21; left calf remains closed. Left heel which was a possible DTI looks better this week. He had macerated tissue around the left dorsal foot right foot looks satisfactory and improved left buttock wound. I changed his dressings to his feet to silver alginate bilaterally. Continuing Hydrofera Blue on the left buttock. 9/28 left calf remains closed. Left heel did not develop anything [possible DTI] dry flaking skin on the left dorsal foot. Right foot looks satisfactory. Improved left buttock wound. We are using silver alginate on his feet Hydrofera Blue on the buttock. I have asked him to go back to the Lotrisone on his feet including the wounds and surrounding areas 10/5; left calf remains closed. The areas on the left and right feet about the same. A lot of this is  epithelialized however debris over the remaining open areas. He is using Lotrisone and silver alginate. The area on the left buttock using Hydrofera Blue 10/26. Patient has been out for 3 weeks secondary to Covid concerns. He tested negative but I think his wife tested positive. He comes in today with the left foot substantially worse, right foot about the same.  Even more concerning he states that the area on his left buttock closed over but then reopened and is considerably deeper in one aspect than it was before [stage III wound] 11/2; left foot really about the same as last week. Quarter sized wound on the dorsal foot just proximal to the first second toes. Surrounding erythema with areas of denuded epithelium. This is not really much different looking. Did not look like cellulitis this time however. ooRight foot area about the same.. We have been using silver alginate alginate on his toes ooLeft buttock still substantial irritated skin around the wound which I think looks somewhat better. We have been using Hydrofera Blue here. 11/9; left foot larger than last week and a very necrotic surface. Right foot I think is about the same perhaps slightly smaller. Debris around the circumference also addressed. Unfortunately on the left buttock there is been a decline. Satellite lesions below the major wound distally and now a an additional one posteriorly we have been using Hydrofera Blue but I think this is a pressure issue 11/16; left foot ulcer dorsally again a very adherent necrotic surface. Right foot is about the same. Not much change in the pressure ulcer on his left buttock. 11/30; left foot ulcer dorsally basically the same as when I saw him 2 weeks ago. Very adherent fibrinous debris on the wound surface. Patient reports a lot of drainage as well. 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 We have been using Iodoflex, patient changed back to alginate because of the drainage. Area on his right dorsal foot really looks benign with a healthier surface certainly a lot better than on the left. Left buttock wounds all improved using Hydrofera Blue 12/7; left dorsal foot again no improvement. Tightly adherent debris. PCR culture I did last week only showed likely skin contaminant. I have gone ahead and  done a punch biopsy of this which is about the last thing in terms of investigations I can think to do. He has known venous insufficiency and venous hypertension and this could be the issue here. The area on the right foot is about the same left buttock slightly worse according to our intake nurse secondary to [facility] Blue sticking to the wound 12/14; biopsy of the left foot that I did last time showed changes that could be related to wound healing/chronic stasis dermatitis phenomenon no neoplasm. We have been using silver alginate to both feet. I change the one on the left today to Sorbact and silver alginate to his other 2 wounds 12/28; the patient arrives with the following problems; ooMajor issue is the dorsal left foot which continues to be a larger deeper wound area. Still with a completely nonviable surface ooParadoxically the area mirror image on the right on the right dorsal foot appears to be getting better. ooHe had some loss of dry denuded skin from the lower part of his original wound on the left lateral calf. Some of this area looked a little vulnerable and for this reason we put him in wrap that on this side this week ooThe area on his left  buttock is larger. He still has the erythematous circular area which I think is a combination of pressure, sweat. This does not look like cellulitis or fungal dermatitis 11/26/2019; -Dorsal left foot large open wound with depth. Still debris over the surface. Using Sorbact ooThe area on the dorsal right foot paradoxically has closed over [facility] has a reopening on the left ankle laterally at the base of his original wound that extended up into the calf. This appears clean. ooThe left buttock wound is smaller but with very adherent necrotic debris over the surface. We have been using silver alginate here as well The patient had arterial studies done in 2017. He had biphasic waveforms at the dorsalis pedis and posterior tibial bilaterally.  ABI in the left was 1.17. Digit waveforms were dampened. He has slight spasticity in the great toes I do not think a TBI would be possible 1/11; the patient comes in today with a sizable reopening between the first and second toes on the right. This is not exactly in the same location where we have been treating wounds previously. According to our intake nurse this was actually fairly deep but 0.6 cm. The area on the left dorsal foot looks about the same the surface is somewhat cleaner using Sorbact, his MRI is in 2 days. We have not managed yet to get arterial studies. The new reopening on the left lateral calf looks somewhat better using alginate. The left buttock wound is about the same using alginate 1/18; the patient had his ARTERIAL studies which were quite normal. ABI in the right at 1.13 with triphasic/biphasic waveforms on the left ABI 1.06 again with triphasic/biphasic waveforms. It would not have been possible to have done a toe brachial index because of spasticity. We have been using Sorbac to the left foot alginate to the rest of his wounds on the right foot left lateral calf and left buttock 1/25; arrives in clinic with erythema and swelling of the left forefoot worse over the first MTP area. This extends laterally dorsally and but also posteriorly. Still has an area on the left lateral part of the lower part of his calf wound it is eschared and clearly not closed. ooArea on the left buttock still with surrounding irritation and erythema. ooRight foot surface wound dorsally. The area between the right and first and second toes appears better. 2/1; ooThe left foot wound is about the same. Erythema slightly better I gave him a week of doxycycline empirically ooRight foot wound is more extensive extending between the toes to the plantar surface ooLeft lateral calf really no open surface on the inferior part of his original wound however the entire area still  looks vulnerable ooAbsolutely no improvement in the left buttock wound required debridement. Objective Constitutional Sitting or standing Blood Pressure is within target range for patient.. Pulse regular and within target range for patient.Marland Kitchen Respirations regular, non-labored and within target range.. Temperature is normal and within the target range for the patient.Marland Kitchen Appears in no distress. Vitals Time Taken: 7:50 AM, Height: 70 in, Weight: 216 lbs, BMI: 31, Temperature: 98.1 F, Pulse: 107 bpm, Respiratory Rate: 20 breaths/min, Blood Pressure: 122/71 mmHg. General Notes: Wound exam ooLeft dorsal foot perhaps somewhat better wound surface but still dusky erythema around the wound which is in his insensate state is difficult to exclude cellulitis although a lot of it me may be stasis dermatitis ooRight dorsal foot I think has extended and today looks more like tinea pedis ooLeft lateral calf still looks  vulnerable at the lower aspect although there is nothing technically open the entire area still looks vulnerable ooLeft buttock wound not only is it no smaller but thick adherent necrotic material. Removed with a #5 curette. Hemostasis with silver nitrate, Gelfoam and a pressure dressing Integumentary (Hair, Skin) Wound #24 status is Open. Original cause of wound was Gradually Appeared. The wound is located on the Left,Dorsal Foot. The wound measures 3cm length x 2.8cm width x 0.5cm depth; 6.597cm^2 area and 3.299cm^3 volume. There is Fat Layer (Subcutaneous Tissue) Exposed exposed. There is no tunneling or undermining noted. There is a large amount of serosanguineous drainage noted. The wound margin is distinct with the outline attached to the wound base. There is small (1-33%) red granulation within the wound bed. There is a large (67-100%) amount of necrotic tissue within the wound bed including Adherent Slough. Wound #35 status is Open. Original cause of wound was Gradually Appeared.  The wound is located on the Left Ischium. The wound measures 3.8cm length x 1.4cm width x 0.2cm depth; 4.178cm^2 area and 0.836cm^3 volume. There is Fat Layer (Subcutaneous Tissue) Exposed exposed. There is no tunneling or undermining noted. There is a medium amount of serosanguineous drainage noted. The wound margin is flat and intact. There is large (67-100%) red, friable granulation within the wound bed. There is no necrotic tissue within the wound bed. Wound #37 status is Open. Original cause of wound was Gradually Appeared. The wound is located on the Left,Lateral Malleolus. The wound measures 2.4cm length x 0.6cm width x 0.2cm depth; 1.131cm^2 area and 0.226cm^3 volume. There is Fat Layer (Subcutaneous Tissue) Exposed exposed. There is no tunneling or undermining noted. There is a medium amount of serosanguineous drainage noted. The wound margin is distinct with the outline attached to the wound base. There is large (67-100%) pink, pale granulation within the wound bed. There is a small (1-33%) amount of necrotic tissue within the wound bed including Adherent Slough. Wound #38 status is Open. Original cause of wound was Gradually Appeared. The wound is located on the Right Toe - Web between 1st and 2nd. The wound measures 3.5cm length x 3cm width x 0.4cm depth; 8.247cm^2 area and 3.299cm^3 volume. There is Fat Layer (Subcutaneous Tissue) Exposed exposed. There is no tunneling or undermining noted. There is a medium amount of serosanguineous drainage noted. The wound margin is distinct with the outline attached to the wound base. There is medium (34-66%) pink, pale granulation within the wound bed. There is a medium (34-66%) amount of necrotic tissue within the wound bed including Adherent Slough. 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 There was a Excisional Skin/Subcutaneous Tissue Debridement with a total area of 5.32 sq cm performed by Ricard Dillon., MD. With the following instrument(s): Curette to remove Viable and Non-Viable tissue/material. Material removed includes Subcutaneous Tissue and Slough and. No specimens were taken. A time out was conducted at 08:40, prior to the start of the procedure. A Moderate amount of bleeding was controlled with Silver Nitrate. The procedure was tolerated well with a pain level of 0 throughout and a pain  level of 0 following the procedure. Post Debridement Measurements: 3.8cm length x 1.4cm width x 0.2cm depth; 0.836cm^3 volume. Post debridement Stage noted as Category/Stage III. Character of Wound/Ulcer Post Debridement is improved. 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 There was a Three Layer Compression Therapy Procedure by Levan Hurst, RN. Post procedure Diagnosis Wound #37: Same as Pre-Procedure Plan Follow-up Appointments: Return Appointment in 1 week. Dressing Change Frequency: Wound #24 Left,Dorsal Foot: Do not change entire dressing for one week. Wound #35 Left Ischium: Change Dressing every other day. Wound #37 Left,Lateral Malleolus: Do not change entire dressing for one week. Wound #38 Right Toe - Web between 1st and 2nd: Change Dressing every other day. Skin Barriers/Peri-Wound Care: Antifungal cream - on toes on both feet daily Moisturizing lotion - both legs Other: - Triamcinolone cream Wound #24 Left,Dorsal Foot: Barrier cream Moisturizing lotion - to  both legs and feet Wound #35 Left Ischium: Antifungal cream - mix with barrier cream Barrier cream Primary Wound Dressing: Wound #24 Left,Dorsal Foot: Cutimed Sorbact - thin layer of hydrogel over Sorbact Wound #35 Left Ischium: Hydrofera Blue Wound #37 Left,Lateral Malleolus: Calcium Alginate with Silver Wound #38 Right Toe - Web between 1st and 2nd: Calcium Alginate with Silver Secondary Dressing: Wound #24 Left,Dorsal Foot: Dry Gauze Heel Cup - add heel cup to left heel for protection. Wound #35 Left Ischium: Foam Border - or ABD pad and tape Wound #37 Left,Lateral Malleolus: Dry Gauze Wound #38 Right Toe - Web between 1st and 2nd: Kerlix/Rolled Gauze - secure with tape Dry Gauze Edema Control: 3 Layer Compression System - Left Lower Extremity Elevate legs to the level of the heart or above for 30 minutes daily and/or when sitting, a frequency of: - throughout the day Support Garment 30-40 mm/Hg pressure to: - Juxtalite to right leg Off-Loading: Low air-loss mattress (Group 2) Roho cushion for wheelchair Turn and reposition every 2 hours - out of wheelchair throughout the day, try to lay on sides, sleep in the bed not the recliner Radiology ordered were: MRI, left foot with and without contrast - Non healing ulcer on left dorsal foot, rule out Osteomyelitis. Obtain I-Stat Creatinine if needed prior to exam. The following medication(s) was prescribed: clindamycin HCl oral 150 mg capsule 3 capsule oral tid for 7days for wound infection left foot starting 01/21/2020 1. I change the dressing to Hydrofera Blue to his buttock which does has not done well with silver alginate 2. I am going to continue to wrap the left lateral leg 3. Empiric clindamycin 450 3 times daily for 7 days for the erythema on the left dorsal foot 4. I have asked him to use Lotrimin to the right foot under the silver alginate. Electronic Signature(s) Signed: 12/24/2019 9:21:16 AM By: Linton Ham  MD Entered By: Linton Ham on 12/24/2019 09:21:16 -------------------------------------------------------------------------------- SuperBill Details Patient Name: Date of Service: [name] 12/24/2019 Medical Record [id number] Patient Account Number: [id number] Date of Birth/Sex: Treating RN: [birth year] (34 y.o. M) Primary Care Provider: [place], [place] Other Clinician: Referring Provider: Treating Provider/Extender:Nikolus [name], [name], [name] [name]: 207 Diagnosis Coding ICD-10 Codes Code Description L97.521 Non-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 L97.511 Non-pressure chronic ulcer of other part of right foot limited to breakdown of skin 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, complete 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, stage 3 L97.321 Non-pressure chronic ulcer  of left ankle limited to breakdown of skin L03.116 Cellulitis of left lower limb Facility Procedures CPT4 Code Description: [id number] - DEB SUBQ TISSUE 20 SQ CM/< ICD-10 Diagnosis Description 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, stage 3 Modifier: Quantity: 1 CPT4 Code Description: [id number] (Facility Use Only) [id number] - [place] [id number] LWR LT LEG Modifier: 25 Quantity: 1 Physician Procedures CPT4 Code: [id number] Description: [id number] - WC PHYS SUBQ TISS 20 SQ CM ICD-10 Diagnosis Description 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, stage 3 Modifier: Quantity: 1 Electronic Signature(s) Signed: 12/24/2019 6:39:44 PM By: Levan Hurst RN, BSN Signed: 12/24/2019 6:41:12 PM By: Linton Ham MD Entered By: Levan Hurst on 12/24/2019 18:08:06

## 2019-12-24 NOTE — Progress Notes (Addendum)
Rybacki, CARTAVIOUS WILLMANN ([id number]) Visit Report for 12/24/2019 Arrival Information Details Patient Name: Date of Service: MANNAN, DEBAKER 12/24/2019 7:30 AM Medical Record [id number] Patient Account Number: [id number] Date of Birth/Sex: Treating RN: 11-18-1986 (34 y.o. [name] Primary Care Tramaine Snell: [place], [place] Other Clinician: Referring Devory Mckinzie: Treating Eupha Lobb/Extender:Robson, [name], [name]: 207 Visit Information History Since Last Visit Added or deleted any medications: No Patient Arrived: Wheel Chair Any new allergies or adverse reactions: No Arrival Time: 07:49 Had a fall or experienced change in No activities of daily living that may affect Accompanied By: self risk of falls: Transfer Assistance: None Signs or symptoms of abuse/neglect since last No Patient Identification Verified: Yes visito Secondary Verification Process Completed: Yes Hospitalized since last visit: No Patient Requires Transmission-Based No Implantable device outside of the clinic excluding No Precautions: cellular tissue based products placed in the center Patient Has Alerts: Yes since last visit: Patient Alerts: R ABI = Has Dressing in Place as Prescribed: Yes 1.0 Has Compression in Place as Prescribed: Yes L ABI = Pain Present Now: No 1.1 Electronic Signature(s) Signed: 12/24/2019 6:01:21 PM By: Deon Pilling Entered By: Deon Pilling on 12/24/2019 07:49:55 -------------------------------------------------------------------------------- Compression Therapy Details Patient Name: Date of Service: CRIBBHappy, Covin 12/24/2019 7:30 AM Medical Record [id number] Patient Account Number: [id number] Date of Birth/Sex: Treating RN: [birth year] (34 y.o. [name] Primary Care Tonja Jezewski: Gassaway, Odon Other Clinician: Referring Sayvon Arterberry: Treating Kalicia Dufresne/Extender:Robson, [name], [name]: 207 Compression Therapy Performed for Wound  Wound #37 Left,Lateral Malleolus Assessment: Performed By: Clinician Levan Hurst, RN Compression Type: Three Layer Post Procedure Diagnosis Same as Pre-procedure Electronic Signature(s) Signed: 12/24/2019 6:39:44 PM By: Levan Hurst RN, BSN Entered By: Levan Hurst on 12/24/2019 08:43:24 -------------------------------------------------------------------------------- Encounter Discharge Information Details Patient Name: Date of Service: Grall, [name] 12/24/2019 7:30 AM Medical Record [id number] Patient Account Number: [id number] Date of Birth/Sex: Treating RN: 19-Apr-1986 (34 y.o. [name] Primary Care Ankush Gintz: Cullen, Vilas Other Clinician: Referring Shakti Fleer: Treating Velda Wendt/Extender:Robson, [name], [name]: 207 Encounter Discharge Information Items Post Procedure Vitals Discharge Condition: Stable Temperature (F): 98.1 Ambulatory Status: Wheelchair Pulse (bpm): 107 Discharge Destination: Home Respiratory Rate (breaths/min): 20 Transportation: Other Blood Pressure (mmHg): 122/71 Accompanied By: self Schedule Follow-up Appointment: Yes Clinical Summary of Care: Patient Declined Electronic Signature(s) Signed: 12/24/2019 6:01:02 PM By: Kela Millin Entered By: Kela Millin on 12/24/2019 08:49:47 -------------------------------------------------------------------------------- Lower Extremity Assessment Details Patient Name: Date of Service: Repka, [name] 12/24/2019 7:30 AM Medical Record [id number] Patient Account Number: [id number] Date of Birth/Sex: Treating RN: Jul 22, 1986 (34 y.o. [name] Primary Care Avana Kreiser: [name], [name] Other Clinician: Referring Mehdi Gironda: Treating Moussa Wiegand/Extender:Robson, [name], [name]: 207 Edema Assessment Assessed: [Left: Yes] [Right: Yes] Edema: [Left: Yes] [Right: Yes] Calf Left: Right: Point of Measurement: 33 cm From Medial Instep 27 cm 33  cm Ankle Left: Right: Point of Measurement: 10 cm From Medial Instep 25 cm 23.5 cm Electronic Signature(s) Signed: 12/24/2019 6:01:21 PM By: Deon Pilling Entered By: Deon Pilling on 12/24/2019 07:54:45 -------------------------------------------------------------------------------- Multi Wound Chart Details Patient Name: Date of Service: Hackley, [name] 12/24/2019 7:30 AM Medical Record [id number] Patient Account Number: [id number] Date of Birth/Sex: Treating RN: 09-14-1986 (34 y.o. M) Primary Care Petrice Beedy: [name], [name] Other Clinician: Referring Cuong Moorman: Treating Brennan Karam/Extender:Robson, [name], [name]: 207 Vital Signs Height(in): 70 Pulse(bpm): 107 Weight(lbs): 216 Blood Pressure(mmHg): 122/71 Body Mass Index(BMI): 31 Temperature(F): 98.1 Respiratory 20 Rate(breaths/min): Photos: [24:No Photos] [35:No Photos] [37:No Photos]  Wound Location: [24:Left Foot - Dorsal] [35:Left Ischium] [37:Left Malleolus - Lateral] Wounding Event: [24:Gradually Appeared] [35:Gradually Appeared] [37:Gradually Appeared] Primary Etiology: [24:Inflammatory] [35:Pressure Ulcer] [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] Comorbid History: [24:Sleep Apnea, Hypertension, Sleep Apnea, Hypertension, Sleep Apnea, Hypertension, Paraplegia] [35:Paraplegia] [37:Paraplegia] Date Acquired: [24:03/08/2018] [35:04/28/2019] [37:11/26/2019] Weeks of Treatment: [24:93] [35:34] [37:4] Wound Status: [24:Open] [35:Open] [37:Open] Clustered Wound: [24:Yes] [35:Yes] [37:No] Clustered Quantity: [24:1] [35:1] [37:N/A] Measurements L x W x D 3x2.8x0.5 [35:3.8x1.4x0.2] [37:2.4x0.6x0.2] (cm) Area (cm) : [24:6.597] [35:4.178] [37:1.131] Volume (cm) : [24:3.299] [35:0.836] [37:0.226] % Reduction in Area: [24:-2695.30%] [35:70.40%] [37:71.40%] % Reduction in Volume: -13645.80% [35:40.90%] [37:42.90%] Classification: [24:Full Thickness Without Exposed Support Structures] [35:Category/Stage III] [37:Full Thickness  Without Exposed Support Structures] Exudate Amount: [24:Large] [35:Medium] [37:Medium] Exudate Type: [24:Serosanguineous] [35:Serosanguineous] [37:Serosanguineous] Exudate Color: [24:red, brown] [35:red, brown] [37:red, brown] Wound Margin: [24:Distinct, outline attached] [35:Flat and Intact] [37:Distinct, outline attached] Granulation Amount: [24:Small (1-33%)] [35:Large (67-100%)] [37:Large (67-100%)] Granulation Quality: [24:Red] [35:Red, Friable] [37:Pink, Pale] Necrotic Amount: [24:Large (67-100%)] [35:None Present (0%)] [37:Small (1-33%)] Exposed Structures: [24:Fat Layer (Subcutaneous Tissue) Exposed: Yes Fascia: No Tendon: No Muscle: No Joint: No Bone: No] [35:Fat Layer (Subcutaneous Tissue) Exposed: Yes Fascia: No Tendon: No Muscle: No Joint: No Bone: No] [37:Fat Layer (Subcutaneous Tissue) Exposed: Yes  Fascia: No Tendon: No Muscle: No Joint: No Bone: No] Epithelialization: [24:None] [35:Small (1-33%)] [37:Small (1-33%)] Debridement: [24:N/A] [35:Debridement - Excisional] [37:N/A] Pre-procedure [24:N/A] [35:08:40] [37:N/A] Verification/Time Out Taken: Tissue Debrided: [24:N/A] [35:Subcutaneous, Slough] [37:N/A] Level: [24:N/A] [35:Skin/Subcutaneous Tissue] [37:N/A] Debridement Area (sq cm):N/A [35:5.32] [37:N/A] Instrument: [24:N/A] [35:Curette] [37:N/A] Bleeding: [24:N/A] [35:Moderate] [37:N/A] Hemostasis Achieved: [24:N/A] [35:Silver Nitrate] [37:N/A] Procedural Pain: [24:N/A] [35:0] [37:N/A] Post Procedural Pain: [24:N/A] [35:0] [37:N/A] Debridement Treatment N/A [35:Procedure was tolerated] [37:N/A] Response: [35:well] Post Debridement [24:N/A] [35:3.8x1.4x0.2] [37:N/A] Measurements L x W x D (cm) Post Debridement [24:N/A] [id number] [37:N/A] Volume: (cm) Post Debridement Stage: N/A [35:Category/Stage III] [37:N/A] Procedures Performed: N/A [35:Debridement 38] [37:Compression Therapy N/A] Photos: [24:No Photos] [35:N/A] [37:N/A] Wound Location: [24:Right Toe - Web  between N/A 1st and 2nd] [37:N/A] Wounding Event: [24:Gradually Appeared] [35:N/A] [37:N/A] Primary Etiology: [24:Inflammatory] [35:N/A] [37:N/A] Comorbid History: [24:Sleep Apnea, Hypertension, N/A Paraplegia] [37:N/A] Date Acquired: [24:11/30/2019] [35:N/A] [37:N/A] Weeks of Treatment: [24:3] [35:N/A] [37:N/A] Wound Status: [24:Open] [35:N/A] [37:N/A] Clustered Wound: [24:No] [35:N/A] [37:N/A] Clustered Quantity: [24:N/A] [35:N/A] [37:N/A] Measurements L x W x D 3.5x3x0.4 [35:N/A] [37:N/A] (cm) Area (cm) : [24:8.247] [35:N/A] [37:N/A] Volume (cm) : [24:3.299] [35:N/A] [37:N/A] % Reduction in Area: [24:-2399.10%] [35:N/A] [37:N/A] % Reduction in Volume: -1328.10% [35:N/A] [37:N/A] Classification: [24:Full Thickness Without Exposed Support Structures] [35:N/A] [37:N/A] Exudate Amount: [24:Medium] [35:N/A] [37:N/A] Exudate Type: [24:Serosanguineous] [35:N/A] [37:N/A] Exudate Color: [24:red, brown] [35:N/A] [37:N/A] Wound Margin: [24:Distinct, outline attached N/A] [37:N/A] Granulation Amount: [24:Medium (34-66%)] [35:N/A] [37:N/A] Granulation Quality: [24:Pink, Pale] [35:N/A] [37:N/A] Necrotic Amount: [24:Medium (34-66%)] [35:N/A N/A] Exposed Structures: [24:Fat Layer (Subcutaneous Tissue) Exposed: Yes Fascia: No Tendon: No Muscle: No Joint: No Bone: No] [35:N/A N/A] Epithelialization: [24:None] [35:N/A N/A] Debridement: [24:N/A] [35:N/A N/A] Tissue Debrided: [24:N/A] [35:N/A N/A] Level: [24:N/A] [35:N/A N/A] Debridement Area (sq cm):N/A [35:N/A N/A] Instrument: [24:N/A] [35:N/A N/A] Bleeding: [24:N/A] [35:N/A N/A] Hemostasis Achieved: [24:N/A] [35:N/A N/A] Procedural Pain: [24:N/A] [35:N/A N/A] Post Procedural Pain: [24:N/A] [35:N/A N/A] Debridement Treatment N/A [35:N/A N/A] Response: Post Debridement [24:N/A] [35:N/A N/A] Measurements L x W x D (cm) Post Debridement [24:N/A] [35:N/A N/A] Volume: (cm) Post Debridement Stage: N/A [35:N/A N/A N/A N/A] Treatment Notes Wound  #24 (Left, Dorsal Foot) 1. Cleanse With Wound Cleanser Soap and water 2. Periwound Care Barrier cream Moisturizing lotion  3. Primary Dressing Applied Other primary dressing (specifiy in notes) 4. Secondary Dressing Dry Gauze Heel Cup 6. Support Layer Applied 3 layer compression wrap Notes sorbact with thin layer of hydrogel Wound #35 (Left Ischium) 1. Cleanse With Wound Cleanser 2. Periwound Care Skin Prep 3. Primary Dressing Applied Hydrofera Blue 4. Secondary Dressing Foam Wound #37 (Left, Lateral Malleolus) 1. Cleanse With Wound Cleanser Soap and water 2. Periwound Care Barrier cream 3. Primary Dressing Applied Calcium Alginate Ag 4. Secondary Dressing Dry Gauze 6. Support Layer Applied 3 layer compression wrap Wound #38 (Right Toe - Web between 1st and 2nd) 1. Cleanse With Wound Cleanser 3. Primary Dressing Applied Calcium Alginate Ag 4. Secondary Dressing Dry Gauze Roll Gauze Notes juxtalite Electronic Signature(s) Signed: 12/24/2019 6:41:12 PM By: Linton Ham MD Entered By: Linton Ham on 12/24/2019 09:12:57 -------------------------------------------------------------------------------- Multi-Disciplinary Care Plan Details Patient Name: Date of Service: [name]. 12/24/2019 7:30 AM Medical Record [id number] Patient Account Number: [id number] Date of Birth/Sex: Treating RN: [birth year] (34 y.o. [name] Primary Care Tashyra Adduci: [name], [name] Other Clinician: Referring Douglass Dunshee: Treating Amanada Philbrick/Extender:Robson, [name], [name]: 207 Active Inactive Wound/Skin Impairment Nursing Diagnoses: Impaired tissue integrity Knowledge deficit related to ulceration/compromised skin integrity Goals: Patient/caregiver will verbalize understanding of skin care regimen Date Initiated: 01/05/2016 Target Resolution Date: 01/18/2020 Goal Status: Active Ulcer/skin breakdown will have a volume reduction of 30% by  week 4 Date Initiated: 01/05/2016 Date Inactivated: 12/22/2017 Target Resolution Date: 01/19/2018 Unmet Reason: complex Goal Status: Unmet wounds, infection Interventions: Assess patient/caregiver ability to obtain necessary supplies Assess ulceration(s) every visit Provide education on ulcer and skin care Notes: Electronic Signature(s) Signed: 12/24/2019 6:39:44 PM By: Levan Hurst RN, BSN Entered By: Levan Hurst on 12/24/2019 07:50:13 -------------------------------------------------------------------------------- Pain Assessment Details Patient Name: Date of Service: Ly, [name] 12/24/2019 7:30 AM Medical Record [id number] Patient Account Number: [id number] Date of Birth/Sex: Treating RN: 1986-05-06 (34 y.o. [name] Primary Care Sebastiana Wuest: Van, [place] Other Clinician: Referring Meenakshi Sazama: Treating Maghen Group/Extender:Robson, [name], [name]: 207 Active Problems Location of Pain Severity and Description of Pain Patient Has Paino No Site Locations Rate the pain. Current Pain Level: 0 Pain Management and Medication Current Pain Management: Medication: No Cold Application: No Rest: No Massage: No Activity: No T.E.N.S.: No Heat Application: No Leg drop or elevation: No Is the Current Pain Management Adequate: Adequate How does your wound impact your activities of daily livingo Sleep: No Bathing: No Appetite: No Relationship With Others: No Bladder Continence: No Emotions: No Bowel Continence: No Work: No Toileting: No Drive: No Dressing: No Hobbies: No Electronic Signature(s) Signed: 12/24/2019 6:01:21 PM By: Deon Pilling Entered By: Deon Pilling on 12/24/2019 07:50:35 -------------------------------------------------------------------------------- Patient/Caregiver Education Details Patient Name: Date of Service: [name] 2/1/2021andnbsp7:30 AM Medical Record [id number] Patient Account Number:  [id number] Date of Birth/Gender: May 17, 1986 (33 y.o. M) Treating RN: Levan Hurst Primary Care Physician: Janine Limbo Other Clinician: Referring Physician: Treating Physician/Extender:Robson, Pecola Leisure in Treatment: 207 Education Assessment Education Provided To: Patient Education Topics Provided Wound/Skin Impairment: Methods: Explain/Verbal Responses: State content correctly Electronic Signature(s) Signed: 12/24/2019 6:39:44 PM By: Levan Hurst RN, BSN Entered By: Levan Hurst on 12/24/2019 07:50:25 -------------------------------------------------------------------------------- Wound Assessment Details Patient Name: Date of Service: Steelman, [name] 12/24/2019 7:30 AM Medical Record [id number] Patient Account Number: [id number] Date of Birth/Sex: Treating RN: Jul 22, 1986 (34 y.o. M) Primary Care Victoriano Campion: [name], [name] Other Clinician: Referring Rand Etchison: Treating Karol Liendo/Extender:Robson, [name], [name]: 207 Wound Status Wound Number:  24 Primary Etiology: Inflammatory Wound Location: Left Foot - Dorsal Wound Status: Open Wounding Event: Gradually Appeared Comorbid Sleep Apnea, Hypertension, History: Paraplegia Date Acquired: 03/08/2018 Weeks Of Treatment: 93 Clustered Wound: Yes Photos Wound Measurements Length: (cm) 3 % Reduct Width: (cm) 2.8 % Reduct Depth: (cm) 0.5 Epitheli Clustered Quantity: 1 Tunnelin Area: (cm) 6.597 Undermi Volume: (cm) 3.299 Wound Description Full Thickness Without Exposed Support Foul Odo Classification: Structures Slough/F Wound Distinct, outline attached Margin: Exudate Large Amount: Exudate Serosanguineous Type: Exudate red, brown Color: Wound Bed Granulation Amount: Small (1-33%) Granulation Quality: Red Fascia E Necrotic Amount: Large (67-100%) Fat Laye Necrotic Quality: Adherent Slough Tendon E Muscle E Joint Ex Bone Exp r After Cleansing: No ibrino Yes Exposed  Structure xposed: No r (Subcutaneous Tissue) Exposed: Yes xposed: No xposed: No posed: No osed: No ion in Area: -2695.3% ion in Volume: -13645.8% alization: None g: No ning: No Treatment Notes Wound #24 (Left, Dorsal Foot) 1. Cleanse With Wound Cleanser Soap and water 2. Periwound Care Barrier cream Moisturizing lotion 3. Primary Dressing Applied Other primary dressing (specifiy in notes) 4. Secondary Dressing Dry Gauze Heel Cup 6. Support Layer Applied 3 layer compression wrap Notes sorbact with thin layer of hydrogel Electronic Signature(s) Signed: 12/26/2019 4:30:25 PM By: Mikeal Hawthorne EMT/HBOT Previous Signature: 12/24/2019 6:01:21 PM Version By: Deon Pilling Entered By: Mikeal Hawthorne on 12/26/2019 15:28:02 -------------------------------------------------------------------------------- Wound Assessment Details Patient Name: Date of Service: Guess, [name] 12/24/2019 7:30 AM Medical Record [id number] Patient Account Number: [id number] Date of Birth/Sex: Treating RN: December 13, 1985 (34 y.o. M) Primary Care Srihith Aquilino: Iago, Olpe Other Clinician: Referring Andranik Jeune: Treating Danae Oland/Extender:Robson, [name], [name]: 207 Wound Status Wound Number: 35 Primary Etiology: Pressure Ulcer Wound Location: Left Ischium Wound Status: Open Wounding Event: Gradually Appeared Comorbid Sleep Apnea, Hypertension, History: Paraplegia Date Acquired: 04/28/2019 Weeks Of Treatment: 34 Clustered Wound: Yes Photos Wound Measurements Length: (cm) 3.8 Width: (cm) 1.4 Depth: (cm) 0.2 Clustered Quantity: 1 Area: (cm) 4.178 Volume: (cm) 0.836 Wound Description Classification: Category/Stage III Wound Margin: Flat and Intact Exudate Amount: Medium Exudate Type: Serosanguineous Exudate Color: red, brown Wound Bed Granulation Amount: Large (67-100%) Granulation Quality: Red, Friable Necrotic Amount: None Present (0%) dor After Cleansing:  No /Fibrino No Exposed Structure posed: No (Subcutaneous Tissue) Exposed: Yes posed: No posed: No osed: No sed: No % Reduction in Area: 70.4% % Reduction in Volume: 40.9% Epithelialization: Small (1-33%) Tunneling: No Undermining: No Foul O Slough Fascia Ex Fat Layer Tendon Ex Muscle Ex Joint Exp Bone Expo Treatment Notes Wound #35 (Left Ischium) 1. Cleanse With Wound Cleanser 2. Periwound Care Skin Prep 3. Primary Dressing Applied Hydrofera Blue 4. Secondary Dressing Foam Electronic Signature(s) Signed: 12/26/2019 4:30:25 PM By: Mikeal Hawthorne EMT/HBOT Previous Signature: 12/24/2019 6:01:21 PM Version By: Deon Pilling Entered By: Mikeal Hawthorne on 12/26/2019 15:29:13 -------------------------------------------------------------------------------- Wound Assessment Details Patient Name: Date of Service: Crabbe, [name] 12/24/2019 7:30 AM Medical Record [id number] Patient Account Number: [id number] Date of Birth/Sex: Treating RN: 01-27-1986 (34 y.o. M) Primary Care Kimika Streater: W[place]om, [place] Other Clinician: Referring Moet Mikulski: Treating Rondarius Kadrmas/Extender:Robson, [name], [name]: 207 Wound Status Wound Number: 37 Primary Etiology: Venous Leg Ulcer Wound Location: Left Malleolus - Lateral Wound Status: Open Wounding Event: Gradually Appeared Comorbid Sleep Apnea, Hypertension, History: Paraplegia Date Acquired: 11/26/2019 Weeks Of Treatment: 4 Clustered Wound: No Photos Wound Measurements Length: (cm) 2.4 % Reduct Width: (cm) 0.6 % Reduct Depth: (cm) 0.2 Epitheli Area: (cm) 1.131 Tunneli Volume: (cm) 0.226 Undermi Wound Description Full Thickness  Without Exposed Support Foul Odo Classification: Structures Slough/F Wound Distinct, outline attached Margin: Exudate Medium Amount: Exudate Serosanguineous Type: Exudate red, brown Color: Wound Bed Granulation Amount: Large (67-100%) Granulation Quality: Pink, Pale Fascia  E Necrotic Amount: Small (1-33%) Fat Laye Necrotic Quality: Adherent Slough Tendon E Muscle E Joint Ex Bone Exp r After Cleansing: No ibrino Yes Exposed Structure xposed: No r (Subcutaneous Tissue) Exposed: Yes xposed: No xposed: No posed: No osed: No ion in Area: 71.4% ion in Volume: 42.9% alization: Small (1-33%) ng: No ning: No Treatment Notes Wound #37 (Left, Lateral Malleolus) 1. Cleanse With Wound Cleanser Soap and water 2. Periwound Care Barrier cream 3. Primary Dressing Applied Calcium Alginate Ag 4. Secondary Dressing Dry Gauze 6. Support Layer Applied 3 layer compression wrap Electronic Signature(s) Signed: 12/26/2019 4:30:25 PM By: Mikeal Hawthorne EMT/HBOT Previous Signature: 12/24/2019 6:01:21 PM Version By: Deon Pilling Entered By: Mikeal Hawthorne on 12/26/2019 15:27:20 -------------------------------------------------------------------------------- Wound Assessment Details Patient Name: Date of Service: Roache, [name] 12/24/2019 7:30 AM Medical Record [id number] Patient Account Number: [id number] Date of Birth/Sex: Treating RN: July 29, 1986 (34 y.o. M) Primary Care Jahvier Aldea: [name], [name] Other Clinician: Referring Kamiya Acord: Treating Jaymason Ledesma/Extender:Robson, [name], [name]: 207 Wound Status Wound Number: 38 Primary Etiology: Inflammatory Wound Location: Right Toe - Web between 1st and Wound Status: Open 2nd Comorbid Sleep Apnea, Hypertension, Wounding Event: Gradually Appeared History: Paraplegia Date Acquired: 11/30/2019 Weeks Of Treatment: 3 Clustered Wound: No Photos Wound Measurements Length: (cm) 3.5 % Reduct Width: (cm) 3 % Reduct Depth: (cm) 0.4 Epitheli Area: (cm) 8.247 Tunneli Volume: (cm) 3.299 Undermi Wound Description Classification: Full Thickness Without Exposed Support Foul Odo Structures Slough/F Wound Distinct, outline attached Margin: Exudate Medium Amount: Exudate  Serosanguineous Type: Exudate red, brown Color: Wound Bed Granulation Amount: Medium (34-66%) Granulation Quality: Pink, Pale Fascia E Necrotic Amount: Medium (34-66%) Fat Laye Necrotic Quality: Adherent Slough Tendon E Muscle E Joint Exp Bone Expo r After Cleansing: No ibrino Yes Exposed Structure xposed: No r (Subcutaneous Tissue) Exposed: Yes xposed: No xposed: No osed: No sed: No ion in Area: -2399.1% ion in Volume: -1328.1% alization: None ng: No ning: No Treatment Notes Wound #38 (Right Toe - Web between 1st and 2nd) 1. Cleanse With Wound Cleanser 3. Primary Dressing Applied Calcium Alginate Ag 4. Secondary Dressing Dry Gauze Roll Gauze Notes juxtalite Electronic Signature(s) Signed: 12/26/2019 4:30:25 PM By: Mikeal Hawthorne EMT/HBOT Previous Signature: 12/24/2019 6:01:21 PM Version By: Deon Pilling Entered By: Mikeal Hawthorne on 12/26/2019 15:28:36 -------------------------------------------------------------------------------- Vitals Details Patient Name: Date of Service: Rentfrow, Deyton E. 12/24/2019 7:30 AM Medical Record [id number] Patient Account Number: [id number] Date of Birth/Sex: Treating RN: 01-Jan-1986 (34 y.o. [name] Primary Care Charli Liberatore: [name], [name] Other Clinician: Referring Appollonia Klee: Treating Myleen Brailsford/Extender:Robson, [name], [name]: 207 Vital Signs Time Taken: 07:50 Temperature (F): 98.1 Height (in): 70 Pulse (bpm): 107 Weight (lbs): 216 Respiratory Rate (breaths/min): 20 Body Mass Index (BMI): 31 Blood Pressure (mmHg): 122/71 Reference Range: 80 - 120 mg / dl Electronic Signature(s) Signed: 12/24/2019 6:01:21 PM By: Deon Pilling Entered By: Deon Pilling on 12/24/2019 07:52:15

## 2019-12-31 ENCOUNTER — Encounter (HOSPITAL_BASED_OUTPATIENT_CLINIC_OR_DEPARTMENT_OTHER): Payer: BC Managed Care – PPO | Admitting: Internal Medicine

## 2019-12-31 ENCOUNTER — Other Ambulatory Visit: Payer: Self-pay

## 2019-12-31 DIAGNOSIS — L97321 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 to breakdown of skin: Secondary | ICD-10-CM | POA: Diagnosis not present

## 2019-12-31 DIAGNOSIS — L89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, stage 3: Secondary | ICD-10-CM | POA: Diagnosis not present

## 2019-12-31 DIAGNOSIS — G4730 Sleep apnea, unspecified: Secondary | ICD-10-CM | POA: Diagnosis not present

## 2019-12-31 DIAGNOSIS — L97511 Non-pressure chronic ulcer of other part of right foot limited to breakdown of skin: Secondary | ICD-10-CM | POA: Diagnosis not present

## 2019-12-31 DIAGNOSIS — L97322 Non-pressure chronic ulcer of left 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: Secondary | ICD-10-CM | POA: Diagnosis not present

## 2019-12-31 DIAGNOSIS — Z8614 Personal history of Methicillin resistant Staphylococcus aureus infection: Secondary | ICD-10-CM | POA: Diagnosis not present

## 2019-12-31 DIAGNOSIS — Z882 Allergy status to sulfonamides status: Secondary | ICD-10-CM | POA: Diagnosis not present

## 2019-12-31 DIAGNOSIS — L97522 Non-pressure chronic ulcer of other part of left foot with fat layer exposed: Secondary | ICD-10-CM | POA: Diagnosis not present

## 2019-12-31 DIAGNOSIS — I10 Essential (primary) hypertension: Secondary | ICD-10-CM | POA: Diagnosis not present

## 2019-12-31 DIAGNOSIS — L03116 Cellulitis of left lower limb: Secondary | ICD-10-CM | POA: Diagnosis not present

## 2019-12-31 DIAGNOSIS — L97521 Non-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: Secondary | ICD-10-CM | POA: Diagnosis not present

## 2019-12-31 DIAGNOSIS — Z9081 Acquired absence of spleen: Secondary | ICD-10-CM | POA: Diagnosis not present

## 2019-12-31 DIAGNOSIS — I890 Lymphedema, not elsewhere classified: Secondary | ICD-10-CM | POA: Diagnosis not present

## 2019-12-31 DIAGNOSIS — G8221 Paraplegia, complete: Secondary | ICD-10-CM | POA: Diagnosis not present

## 2019-12-31 DIAGNOSIS — I872 Venous insufficiency (chronic) (peripheral): Secondary | ICD-10-CM | POA: Diagnosis not present

## 2019-12-31 DIAGNOSIS — L03115 Cellulitis of right lower limb: Secondary | ICD-10-CM | POA: Diagnosis not present

## 2019-12-31 DIAGNOSIS — Z87891 Personal history of nicotine dependence: Secondary | ICD-10-CM | POA: Diagnosis not present

## 2019-12-31 DIAGNOSIS — Z888 Allergy status to other drugs, medicaments and biological substances status: Secondary | ICD-10-CM | POA: Diagnosis not present

## 2020-01-01 NOTE — Progress Notes (Signed)
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 wound rather than in the exact circumference. There is still small open areas mostly along the lateral margin of the wound. He has unchanged areas between the left first and second and the right first second toes which I been treating for tenia pedis 06/02/17; left lateral leg venous insufficiency wound. We have been using Hydrofera Blue. Somewhat smaller from the wound circumference. The surface of the wound remains a bit on it almost epithelialized sedation in appearance. I use an open curette today debridement in the surface of all of this especially the edges Small open wounds remaining on the dorsal right first and second toe interspace and the plantar left first second toe and her face on the left 06/09/17; wound on the left lateral leg continues to be smaller but very gradual and very dry surface using Hydrofera Blue 06/16/17 requires weekly debridements now on the left lateral leg although this continues to contract. I changed to silver collagen last week because of dryness of the wound bed. Using Iodoflex to the areas on his first and second toes/web space  bilaterally 06/24/17; patient with history of paraplegia also chronic venous insufficiency with lymphedema. Has a very difficult wound on the left lateral leg. This has been gradually reducing in terms of with but comes  in with a very dry adherent surface. High switch to silver collagen a week or so ago with hydrogel to keep the area moist. This is been refractory to multiple dressing attempts. He also has areas in his first and second toes bilaterally in the anterior and posterior web space. I had been using Iodoflex here after a prolonged course of silver alginate with ketoconazole was ineffective [question tinea pedis] 07/14/17; patient arrives today with a very difficult adherent material over his left lateral lower leg wound. He also has surrounding erythema and poorly controlled edema. He was switched his Santyl last visit which the nurses are applying once during his doctor visit and once on a nurse visit. He was also reduced to 2 layer compression I'm not exactly sure of the issue here. 07/21/17; better surface today after 1 week of Iodoflex. Significant cellulitis that we treated last week also better. [Doxycycline] 07/28/17 better surface today with now 2 weeks of Iodoflex. Significant cellulitis treated with doxycycline. He has now completed the doxycycline and he is back to his usual degree of chronic venous inflammation/stasis dermatitis. He reminds me he has had ablations surgery here 08/04/17; continued improvement with Iodoflex to the left lateral leg wound in terms of the surface of the wound although the dimensions are better. 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 Problematic areas on the plantar aspect of the first second toe web space on the left and the dorsal aspect of the first second toe web space on the right. At one point I felt these were probably related to chronic fungal infections in treated him  aggressively for this although we have not made any improvement here. 08/11/17; left lateral leg. Surface continues to improve with the Iodoflex although we are not seeing much improvement in overall wound dimensions. Areas on his plantar left foot and right foot show no improvement. In fact the right foot looks somewhat worse 08/18/17; left lateral leg. We changed to [facility] Blue last week after a prolonged course of Iodoflex which helps get the surface better. It appears that the wound with is improved. Continue with difficult areas on the left dorsal first second and plantar first second on the right 09/01/17; patient arrives in clinic today having had a temperature of 103 yesterday. He was seen in the ER and [facility]. The patient was concerned he could have cellulitis again in the right leg however they diagnosed him with a UTI and he is now on Keflex. He has a history of cellulitis which is been recurrent and difficult but this is been in the left leg, in the past 5 use doxycycline. He does in and out catheterizations at home which are risk factors for UTI 09/08/17; patient will be completing his Keflex this weekend. The erythema on the left leg is considerably better. He has a new wound today on the medial part of the right leg small superficial almost looks like a skin tear. He has worsening of the area on the right dorsal first and second toe. His major area on the left lateral leg is better. Using Hydrofera Blue on all areas 09/15/17; gradual reduction in width on the long wound in the left lateral leg. No debridement required. He also has wounds on the plantar aspect of his left first second toe web space and on the dorsal aspect of the right first second toe web space. 09/22/17; there continues to be very gradual improvements in the dimensions  of the left lateral leg wound. He hasn't round erythematous spot with might be pressure on his wheelchair. There is no evidence obviously of  infection no purulence no warmth He has a dry scaled area on the plantar aspect of the left first second toe Improved area on the dorsal right first second toe. 09/29/17; left lateral leg wound continues to improve in dimensions mostly with an is still a fairly long but increasingly narrow wound. He has a dry scaled area on the plantar aspect of his left first second toe web space Increasingly concerning area on the dorsal right first second toe. In fact I am concerned today about possible cellulitis around this wound. The areas extending up his second toe and although there is deformities here almost appears to abut on the nailbed. 10/06/17; left lateral leg wound continues to make very gradual progress. Tissue culture I did from the right first second toe dorsal foot last time grew MRSA and enterococcus which was vancomycin sensitive. This was not sensitive to clindamycin or doxycycline. He is allergic to Zyvox and sulfa we have therefore arrange for him to have dalvance infusion tomorrow. He is had this in the past and tolerated it well 10/20/17; left lateral leg wound continues to make decent progress. This is certainly reduced in terms of with there is advancing epithelialization.The cellulitis in the right foot looks better although he still has a deep wound in the dorsal aspect of the first second toe web space. Plantar left first toe web space on the left I think is making some progress 10/27/17; left lateral leg wound continues to make decent progress. Advancing epithelialization.using Hydrofera Blue The right first second toe web space wound is better-looking using silver alginate Improvement in the left plantar first second toe web space. Again using silver alginate 11/03/17 left lateral leg wound continues to make decent progress albeit slowly. Using [facility] The right per second toe web space continues to be a very problematic looking punched out wound. I obtained a piece of  tissue for deep culture I did extensively treated this for fungus. It is difficult to imagine that this is a pressure area as the patient states other than going outside he doesn't really wear shoes at home The left plantar first second toe web space looked fairly senescent. Necrotic edges. This required debridement change to [facility] Blue to all wound areas 11/10/17; left lateral leg wound continues to contract. Using Hydrofera Blue On the right dorsal first second toe web space dorsally. Culture I did of this area last week grew MRSA there is not an easy oral option in this patient was multiple antibiotic allergies or intolerances. This was only a rare culture isolate I'm therefore going to use Bactroban under silver alginate On the left plantar first second toe web space. Debridement is required here. This is also unchanged 11/17/17; left lateral leg wound continues to contract using Hydrofera Blue this is no longer the major issue. The major concern here is the right first second toe web space. He now has an open area going from dorsally to the plantar aspect. There is now wound on the inner lateral part of the first toe. Not a very viable surface on this. There is erythema spreading medially into the forefoot. No major change in the left first second toe plantar wound 11/24/17; left lateral leg wound continues to contract using Hydrofera Blue. Nice improvement today The right first second toe web space all of this looks a lot less angry  than last week. I have given him clindamycin and topical Bactroban for MRSA and terbinafine for the possibility of underlining tinea pedis that I could not control with ketoconazole. Looks somewhat better The area on the plantar left first second toe web space is weeping with dried debris around the wound 12/01/17; left lateral leg wound continues to contract he Hydrofera Blue. It is becoming thinner in terms of with nevertheless it is making good  improvement. The right first second toe web space looks less angry but still a large necrotic-looking wounds starting on the plantar aspect of the right foot extending between the toes and now extensively on the base of the right second toe. I gave him clindamycin and topical Bactroban for MRSA anterior benefiting for the possibility of underlying tinea pedis. Not looking better today The area on the left first/second toe looks better. Debrided of necrotic debris 12/05/17* the patient was worked in urgently today because over the weekend he found blood on his incontinence bad when he woke up. He was found to have an ulcer by his wife who does most of his wound care. He came in today for [place] to look at this. He has not had a history of wounds in his buttocks in spite of his paraplegia. 12/08/17; seen in follow-up today at his usual appointment. He was seen earlier this week and found to have a new wound on his buttock. We also follow him for wounds on the left lateral leg, left first second toe web space and right first second toe web space 12/15/17; we have been using Hydrofera Blue to the left lateral leg which has improved. The right first second toe web space has also improved. Left first second toe web space plantar aspect looks stable. The left buttock has worsened using Santyl. Apparently the buttock has drainage 12/22/17; we have been using Hydrofera Blue to the left lateral leg which continues to improve now 2 small wounds separated by normal skin. He tells [place] he had a fever up to 100 yesterday he is prone to UTIs but has not noted anything different. He does in and out catheterizations. The area between the first and second toes today does not look good necrotic surface covered with what looks to be purulent drainage and erythema extending into the third toe. I had gotten this to something that I thought look better last time however it is not look good today. He also has a necrotic surface  over the buttock wound which is expanded. I thought there might be infection under here so I removed a lot of the surface with a #5 curet though nothing look like it really needed culturing. He is been using Santyl to this area 12/27/17; his original wound on the left lateral leg continues to improve using Hydrofera Blue. I gave him samples of Baxdella although he was unable to take them out of fear for an allergic reaction ["lump in his throat"].the culture I did of the purulent drainage from his second toe last week showed both enterococcus and a set Enterobacter I was also concerned about the erythema on the bottom of his foot although paradoxically although this looks somewhat better today. 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 We've been using silver alginate here. They came up today that he sleeps in a recliner, I'm not sure why but I asked him to stop this 01/03/18; his original wound we've  been using Hydrofera Blue is now separated into 2 areas. Ulcer on his left buttock is better he is off the recliner and sleeping in bed Finally both wound areas between his first and second toes also looks some better 01/10/18; his original wound on the left lateral leg is now separated into 2 wounds we've been using Hydrofera Blue Ulcer on his left buttock has some drainage. There is a small probing site going into muscle layer superiorly.using silver alginate -He arrives today with a deep tissue injury on the left heel The wound on the dorsal aspect of his first second toe on the left looks a lot betterusing silver alginate ketoconazole The area on the first second toe web space on the right also looks a lot bette 01/17/18; his original wound on the left lateral leg continues to progress using Hydrofera Blue Ulcer on his left buttock also is smaller surface healthier except for a small probing site going  into the muscle layer superiorly. 2.4 cm of tunneling in this area DTI on his left heel we have only been offloading. Looks better than last week no threatened open no evidence of infection the wound on the dorsal aspect of the first second toe on the left continues to look like it's regressing we have only been using silver alginate and terbinafine orally The area in the first second toe web space on the right also looks to be a lot better using silver alginate and terbinafine I think this was prompted by tinea pedis 01/31/18; the patient was hospitalized in [place] last week apparently for a complicated UTI. He was discharged on cefepime he does in and out catheterizations. In the hospital he was discovered M I don't mild elevation of AST and ALTs and the terbinafine was stopped.predictably the pressure ulcer on his buttock looks betterusing silver alginate. The area on the left lateral leg also is better using Hydrofera Blue. The area between the first and second toes on the left better. First and second toes on the right still substantial but better. Finally the DTI on the left heel has held together and looks like it's resolving 02/07/18-he is here in follow-up evaluation for multiple ulcerations. He has new injury to the lateral aspect of the last issue a pressure ulcer, he states this is from adhesive removal trauma. He states he has tried multiple adhesive products with no success. All other ulcers appear stable. The left heel DTI is resolving. We will continue with same treatment plan and follow-up next week. 02/14/18; follow-up for multiple areas. He has a new area last week on the lateral aspect of his pressure ulcer more over the posterior trochanter. The original pressure ulcer looks quite stable has healthy granulation. 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 Almost  fully epithelialized on the original superior area using Hydrofera Blue DTI on the left heel has peeled off this week to reveal a small superficial wound under denuded skin and subcutaneous tissue Both areas between the first and second toes look better including nothing open on the left 02/21/18; The patient's wounds on his left ischial tuberosity and posterior left greater trochanter actually looked better. He has a large area of irritation around the area which I think is contact dermatitis. I am doubtful that this is fungal His original wound on the left lateral calf continues to improve we have been using Hydrofera Blue There is no open  area in the left first second toe web space although there is a lot of thick callus The DTI on the left heel required debridement today of necrotic surface eschar and subcutaneous tissue using silver alginate Finally the area on the right first second toe webspace continues to contract using silver alginate and ketoconazole 02/28/18 Left ischial tuberosity wounds look better using silver alginate. Original wound on the left calf only has one small open area left using Hydrofera Blue DTI on the left heel required debridement mostly removing skin from around this wound surface. Using silver alginate The areas on the right first/second toe web space using silver alginate and ketoconazole 03/08/18 on evaluation today patient appears to be doing decently well as best I can tell in regard to his wounds. This is the first time that I have seen him as he generally is followed by Dr. Dellia Nims. With that being said none of his wounds appear to be infected he does have an area where there is some skin covering what appears to be a new wound on the left dorsal surface of his great toe. This is right at the nail bed. With that being said I do believe that debrided away some of the excess skin can be of benefit in this regard. Otherwise he has been tolerating the dressing  changes without complication. 03/14/18; patient arrives today with the multiplicity of wounds that we are following. He has not been systemically unwell Original wound on the left lateral calf now only has 2 small open areas we've been using Hydrofera Blue which should continue The deep tissue injury on the left heel requires debridement today. We've been using silver alginate The left first second toe and the right first second toe are both are reminiscence what I think was tinea pedis. Apparently some of the callus Surface between the toes was removed last week when it started draining. Purulent drainage coming from the wound on the ischial tuberosity on the left. 03/21/18-He is here in follow-up evaluation for multiple wounds. 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 He tolerated debridement. The only change to last week's recommendations is to discontinue antifungal cream between toes. He will follow-up next week 03/28/18; following up for multiple wounds;Concern this week is streaking redness and swelling in the right foot. He is going to need antibiotics for this. 03/31/18; follow-up for right foot cellulitis. Streaking redness and swelling in the right foot on 03/28/18. 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 I put him on clindamycin 300 mg every 6 and brought him in for a quick check. He has an open wound between his first and second toes on the right foot as a potential source. 04/04/18; Right foot cellulitis is resolving he is completing clindamycin. This is truly good news Left lateral calf wound which is initial wound only has one small open area inferiorly this is close to healing out. He has compression stockings. We will use Hydrofera Blue right down to the epithelialization of this Nonviable surface on the left heel which was initially pressure with a DTI. We've been using Hydrofera  Blue. I'm going to switch this back to silver alginate Left first second toe/tinea pedis this looks better using silver alginate Right first second toe tinea pedis using silver alginate Large pressure ulcers on theLeft ischial tuberosity. Small wound here Looks better. I am uncertain about the surface over the large wound. Using silver alginate 04/11/18; Cellulitis in  the right foot is resolved Left lateral calf wound which was his original wounds still has 2 tiny open areas remaining this is just about closed Nonviable surface on the left heel is better but still requires debridement Left first second toe/tinea pedis still open using silver alginate Right first second toe wound tinea pedis I asked him to go back to using ketoconazole and silver alginate Large pressure ulcers on the left ischial tuberosity this shear injury here is resolved. Wound is smaller. No evidence of infection using silver alginate 04/18/18; Patient arrives with an intense area of cellulitis in the right mid lower calf extending into the right heel area. Bright red and warm. Smaller area on the left anterior leg. He has a significant history of MRSA. He will definitely need antibioticsdoxycycline He now has 2 open areas on the left ischial tuberosity the original large wound and now a satellite area which I think was above his initial satellite areas. Not a wonderful surface on this satellite area surrounding erythema which looks like pressure related. His left lateral calf wound again his original wound is just about closed Left heel pressure injury still requiring debridement Left first second toe looks a lot better using silver alginate Right first second toe also using silver alginate and ketoconazole cream also looks better 04/20/18; the patient was worked in early today out of concerns with his cellulitis on the right leg. I had started him on doxycycline. This was 2 days ago. His wife was concerned about the  swelling in the area. Also concerned about the left buttock. He has not been systemically unwell no fever chills. No nausea vomiting or diarrhea 04/25/18; the patient's left buttock wound is continued to deteriorate he is using Hydrofera Blue. He is still completing clindamycin for the cellulitis on the right leg although all of this looks better. 05/02/18 Left buttock wound still with a lot of drainage and a very tightly adherent fibrinous necrotic surface. He has a deeper area superiorly The left lateral calf wound is still closed DTI wound on the left heel necrotic surface especially the circumference using Iodoflex Areas between his left first second toe and right first second toe both look better. Dorsally and the right first second toe he had a necrotic surface although at smaller. In using silver alginate and ketoconazole. I did a culture last week which was a deep tissue culture of the reminiscence of the open wound on the right first second toe dorsally. This grew a few Acinetobacter and a few methicillin-resistant staph aureus. Nevertheless the area actually this week looked better. I didn't feel the need to specifically address this at least in terms of systemic antibiotics. 05/09/18; wounds are measuring larger more drainage per our intake. We are using Santyl covered with alginate on the large superficial buttock wounds, Iodosorb on the left heel, ketoconazole and silver alginate to the dorsal first and second toes bilaterally. 05/16/18; The area on his left buttock better in some aspects although the area superiorly over the ischial tuberosity required an extensive debridement.using Santyl Left heel appears stable. Using Iodoflex The areas between his first and second toes are not bad however there is spreading erythema up the dorsal aspect of his left foot this looks like cellulitis again. He is insensate the erythema is really very brilliant.o Erysipelas He went to see an allergist  days ago because he was itching part of this he had lab work done. This showed a white count of 15.1 with 70% neutrophils. Hemoglobin  of 11.4 and a platelet count of 659,000. Last white count we had in Epic was a 2-1/2 years ago which was 25.9 but he was ill at the time. He was able to show me some lab work that was done by his primary physician the pattern is about the same. 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 But prompted me to go ahead and do x-rays of both feet and the pelvis rule out osteomyelitis. He also had a comprehensive metabolic panel this was reasonably normal his albumin was 3.7 liver function tests BUN/creatinine all normal 05/23/18; x-rays of both his feet from last week were negative for underlying pulmonary abnormality. The x-ray of his pelvis however showed mild irregularity in the left ischial which may represent some early osteomyelitis. The wound in the left ischial continues to get deeper clearly now exposed muscle. Each week necrotic surface material over this area. Whereas the rest of the wounds do not look so bad. The left ischial wound we have been using Santyl and calcium alginate To the left heel surface necrotic debris using Iodoflex The left lateral leg is still healed Areas on the left dorsal foot and the right dorsal foot are about the same. There is some inflammation on the left which might represent contact dermatitis, fungal dermatitis I am doubtful cellulitis although this looks better than last week 05/30/18; CT scan done at Hospital did not show any osteomyelitis or abscess. Suggested the possibility of underlying cellulitis although I don't see a lot of evidence of this at the bedside The wound itself on the left buttock/upper thigh actually looks somewhat better. No debridement Left heel also looks better no debridement continue Iodoflex Both dorsal first second toe spaces appear better using Lotrisone. Left  still required debridement 06/06/18; Intake reported some purulent looking drainage from the left gluteal wound. Using Santyl and calcium alginate Left heel looks better although still a nonviable surface requiring debridement The left dorsal foot first/second webspace actually expanding and somewhat deeper. I may consider doing a shave biopsy of this area Right dorsal foot first/second webspace appears stable to improved. Using Lotrisone and silver alginate to both these areas 06/13/18 Left gluteal surface looks better. Now separated in the 2 wounds. No debridement required. Still drainage. We'll continue silver alginate Left heel continues to look better with Iodoflex continue this for at least another week Of his dorsal foot wounds the area on the left still has some depth although it looks better than last week. We've been using Lotrisone and silver alginate 06/20/18 Left gluteal continues to look better healthy tissue Left heel continues to look better healthy granulation wound is smaller. He is using Iodoflex and his long as this continues continue the Iodoflex Dorsal right foot looks better unfortunately dorsal left foot does not. There is swelling and erythema of his forefoot. He had minor trauma to this several days ago but doesn't think this was enough to have caused any tissue injury. Foot looks like cellulitis, we have had this problem before 06/27/18 on evaluation today patient appears to be doing a little worse in regard to his foot ulcer. 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 Both of which would really be his only options for treating this infection. In the past he has been given and effusion of Orbactiv. This  is done very well for him in the past again it's one time dosing IV antibiotic therapy. Subsequently I do believe this is  something we're gonna need to see about doing at this point in time. Currently his other wounds seem to be doing somewhat better in my pinion I'm pretty happy in that regard. 07/03/18 on evaluation today patient's wounds actually appear to be doing fairly well. He has been tolerating the dressing changes without complication. All in all he seems to be showing signs of improvement. In regard to the antibiotics he has been dealing with infectious disease since I saw him last week as far as getting this scheduled. In the end [facility]'s [facility] this coming Friday. In the meantime he has been continuing to perform the dressing changes in such as previous. There does not appear to be any evidence of infection worsengin at this time. 07/10/18; Since I last saw this man 2 weeks ago things have actually improved. IV antibiotics of resulted in less forefoot erythema although there is still some present. He is not systemically unwell Left buttock wounds 2 now have no depth there is increased epithelialization Using silver alginate Left heel still requires debridement using Iodoflex Left dorsal foot still with a sizable wound about the size of a border but healthy granulation Right dorsal foot still with a slitlike area using silver alginate 07/18/18; the patient's cellulitis in the left foot is improved in fact I think it is on its way to resolving. Left buttock wounds 2 both look better although the larger one has hypertension granulation we've been using silver alginate Left heel has some thick circumferential redundant skin over the wound edge which will need to be removed today we've been using Iodoflex Left dorsal foot is still a sizable wound required debridement using silver alginate The right dorsal foot is just about closed only a small open area remains here 07/25/18; left foot cellulitis is resolved Left buttock wounds 2 both look better.  Hyper-granulation on the major area Left heel as some debris over the surface but otherwise looks a healthier wound. Using silver collagen Right dorsal foot is just about closed 07/31/18; arrives with our intake nurse worried about purulent drainage from the buttock. We had hyper-granulation here last week His buttock wounds 2 continue to look better Left heel some debris over the surface but measuring smaller. Right dorsal foot unfortunately has openings between the toes Left foot superficial wound looks less aggravated. 08/07/18 Buttock wounds continue to look better although some of her granulation and the larger medial wound. silver alginate Left heel continues to look a lot better.silver collagen Left foot superficial wound looks less stable. Requires debridement. He has a new wound superficial area on the foot on the lateral dorsal foot. Right foot looks better using silver alginate without Lotrisone 08/14/2018; patient was in the ER last week diagnosed with a UTI. He is now on Cefpodoxime and Macrodantin. Buttock wounds continued to be smaller. Using silver alginate Left heel continues to look better using silver collagen Left foot superficial wound looks as though it is improving Right dorsal foot area is just about healed. 08/21/2018; patient is completed his antibiotics for his UTI. He has 2 open areas on the buttocks. There is still not closed although the surface looks satisfactory. Using silver alginate Left heel continues to improve using silver collagen The bilateral dorsal foot areas which are at the base of his  first and second toes/possible tinea pedis are actually stable on the left but worse on the right. The area on the left required debridement of necrotic surface. After debridement I obtained a specimen for PCR culture. The right dorsal foot which is been just about healed last week is now reopened 08/28/2018; culture done on the left dorsal foot showed coag negative  staph both staph epidermidis and Lugdunensis. I think this is worthwhile initiating systemic treatment. I will use doxycycline given his long list of allergies. The area on the left heel slightly improved but still requiring debridement. The large wound on the buttock is just about closed whereas the smaller one is larger. Using silver alginate in this area 09/04/2018; patient is completing his doxycycline for the left foot although this continues to be a very difficult wound area with very adherent necrotic debris. We are using silver alginate to all his wounds right foot left foot and the small wounds on his buttock, silver collagen on the left heel. 09/11/2018; once again this patient has intense erythema and swelling of the left forefoot. Lesser degrees of erythema in the right foot. He has a long list of allergies and intolerances. I will reinstitute doxycycline. 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 Using silver alginate Left heel also looks better using silver collagen Unfortunately both the areas on his feet look worse. The area on the left first second webspace is now gone through to the plantar part of his foot. The area on the left foot anteriorly is irritated with erythema and swelling in the forefoot. 09/25/2018 His wound on the left plantar heel looks better. Using silver collagen The area on the left buttock 2 small remnant areas. One is closed one is still open. Using silver alginate The areas between both his first and second toes look worse. This in spite of long-standing antifungal therapy with ketoconazole and silver alginate which should have antifungal activity He has small areas around his original wound on the left calf one is on the bottom of the original scar tissue and one superiorly both of these are small and superficial but again given wound history in this site this is worrisome 10/02/2018 Left plantar heel  continues to gradually contract using silver collagen Left buttock wound is unchanged using silver alginate The areas on his dorsal feet between his first and second toes bilaterally look about the same. I prescribed clindamycin ointment to see if we can address chronic staph colonization and also the underlying possibility of erythrasma The left lateral lower extremity wound is actually on the lateral part of his ankle. Small open area here. We have been using silver alginate 10/09/2018; Left plantar heel continues to look healthy and contract. No debridement is required Left buttock slightly smaller with a tape injury wound just below which was new this week Dorsal feet somewhat improved I have been using clindamycin Left lateral looks lower extremity the actual open area looks worse although a lot of this is epithelialized. I am going to change to silver collagen today He has a lot more swelling in the right leg although this is not pitting not red and not particularly warm there is a lot of spasm in the right leg usually indicative of people with paralysis of some underlying discomfort. We have reviewed his vascular status from 2017 he had a left greater saphenous vein ablation. I wonder about referring him back to vascular surgery if the  area on the left leg continues to deteriorate. 10/16/2018 in today for follow-up and management of multiple lower extremity ulcers. His left Buttock wound is much lower smaller and almost closed completely. The wound to the left ankle has began to reopen with Epithelialization and some adherent slough. He has multiple new areas to the left foot and leg. The left dorsal foot without much improvement. Wound present between left great webspace and 2nd toe. Erythema and edema present right leg. Right LE ultrasound obtained on 10/10/18 was negative for DVT. 10/23/2018; Left buttock is closed over. Still dry macerated skin but there is no open wound. I suspect  this is chronic pressure/moisture Left lateral calf is quite a bit worse than when I saw this last. There is clearly drainage here he has macerated skin into the left plantar heel. We will change the primary dressing to alginate Left dorsal foot has some improvement in overall wound area. Still using clindamycin and silver alginate Right dorsal foot about the same as the left using clindamycin and silver alginate The erythema in the right leg has resolved. He is DVT rule out was negative Left heel pressure area required debridement although the wound is smaller and the surface is health 10/26/2018 The patient came back in for his nurse check today predominantly because of the drainage coming out of the left lateral leg with a recent reopening of his original wound on the left lateral calf. He comes in today with a large amount of surrounding erythema around the wound extending from the calf into the ankle and even in the area on the dorsal foot. He is not systemically unwell. He is not febrile. Nevertheless this looks like cellulitis. We have been using silver alginate to the area. I changed him to a regular visit and I am going to prescribe him doxycycline. The rationale here is a long list of medication intolerances and a history of MRSA. I did not see anything that I thought would provide a valuable culture 10/30/2018 Follow-up from his appointment 4 days ago with really an extensive area of cellulitis in the left calf left lateral ankle and left dorsal foot. I put him on doxycycline. He has a long list of medication allergies which are true allergy reactions. 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 In any case he is a lot better today. The erythema especially in the anterior and lateral left calf is better. He still has left ankle erythema. He also is complaining about increasing edema in the right leg we have only been  using Kerlix Coban and he has been doing the wraps at home. Finally he has a spotty rash on the medial part of his upper left calf which looks like folliculitis or perhaps wrap occlusion type injury. Small superficial macules not pustules 11/06/18 patient arrives today with again a considerable degree of erythema around the wound on the left lateral calf extending into the dorsal ankle and dorsal foot. This is a lot worse than when I saw this last week. He is on doxycycline really with not a lot of improvement. He has not been systemically unwell Wounds on the; left heel actually looks improved. Original area on the left foot and proximity to the first and second toes looks about the same. He has superficial areas on the dorsal foot, anterior calf and then the reopening of his original wound on the left lateral calf which looks about the same  The only area he has on the right is the dorsal webspace first and second which is smaller. He has a large area of dry erythematous skin on the left buttock small open area here. 11/13/2018; the patient arrives in much better condition. The erythema around the wound on the left lateral calf is a lot better. Not sure whether this was the clindamycin or the TCA and ketoconazole or just in the improvement in edema control [stasis dermatitis]. In any case this is a lot better. The area on the left heel is very small and just about resolved using silver collagen we have been using silver alginate to the areas on his dorsal feet 11/20/2018; his wounds include the left lateral calf, left heel, dorsal aspects of both feet just proximal to the first second webspace. He is stable to slightly improved. I did not think any changes to his dressings were going to be necessary 11/27/2018 he has a reopening on the left buttock which is surrounded by what looks like tinea or perhaps some other form of dermatitis. The area on the left dorsal foot has some erythema around it I  have marked this area but I am not sure whether this is cellulitis or not. Left heel is not closed. Left calf the reopening is really slightly longer and probably worse 1/13; in general things look better and smaller except for the left dorsal foot. Area on the left heel is just about closed, left buttock looks better only a small wound remains in the skin looks better [using Lotrisone] 1/20; the area on the left heel only has a few remaining open areas here. Left lateral calf about the same in terms of size, left dorsal foot slightly larger right lateral foot still not closed. The area on the left buttock has no open wound and the surrounding skin looks a lot better 1/27; the area on the left heel is closed. Left lateral calf better but still requiring extensive debridements. The area on his left buttock is closed. He still has the open areas on the left dorsal foot which is slightly smaller in the right foot which is slightly expanded. We have been using Iodoflex on these areas as well 2/3; left heel is closed. Left lateral calf still requiring debridement using Iodoflex there is no open area on his left buttock however he has dry scaly skin over a large area of this. Not really responding well to the Lotrisone. Finally the areas on his dorsal feet at the level of the first second webspace are slightly smaller on the right and about the same on the left. Both of these vigorously debrided with Anasept and gauze 2/10; left heel remains closed he has dry erythematous skin over the left buttock but there is no open wound here. Left lateral leg has come in and with. Still requiring debridement we have been using Iodoflex here. Finally the area on the left dorsal foot and right dorsal foot are really about the same extremely dry callused fissured areas. He does not yet have a dermatology appointment 2/17; left heel remains closed. He has a new open area on the left buttock. The area on the left  lateral calf is bigger longer and still covered in necrotic debris. No major change in his foot areas bilaterally. I am awaiting for a dermatologist to look on this. We have been using ketoconazole I do not know that this is been doing any good at all. 2/24; left heel remains closed. The left  buttock wound that was new reopening last week looks better. The left lateral calf appears better also although still requires debridement. The major area on his foot is the left first second also requiring debridement. We have been putting Prisma on all wounds. I do not believe that the ketoconazole has done too much good for his feet. He will use Lotrisone I am going to give him a 2-week course of terbinafine. We still do not have a dermatology appointment 3/2 left heel remains closed however there is skin over bone in this area I pointed this out to him today. The left buttock wound is epithelialized but still does not look completely stable. The area on the left leg required debridement were using silver collagen here. With regards to his feet we changed to Lotrisone last week and silver alginate. 3/9; left heel remains closed. Left buttock remains closed. The area on the right foot is essentially closed. The left foot remains unchanged. Slightly smaller on the left lateral calf. Using silver collagen to both of these areas 3/16-Left heel remains closed. Area on right foot is closed. Left lateral calf above the lateral malleolus open wound requiring debridement with easy bleeding. Left dorsal wound proximal to first toe also debrided. Left ischial area open new. Patient has been using Prisma with wrapping every 3 days. Dermatology appointment is apparently tomorrow.Patient has completed his terbinafine 2-week course with some apparent improvement according to him, there is still flaking and dry skin in his foot on the left 3/23; area on the right foot is reopened. The area on the left anterior foot is  about the same still a very necrotic adherent surface. He still has the area on the left leg and reopening is on the left buttock. He apparently saw dermatology although I do not have a note. According to the patient who is usually fairly well informed they did not have any good ideas. Put him on oral terbinafine which she is been on before. 3/30; using silver collagen to all wounds. Apparently his dermatologist put him on doxycycline and rifampin presumably some culture grew staph. I do not have this result. He remains on terbinafine although I have used terbinafine on him before 4/6; patient has had a fairly substantial reopening on the right foot between the first and second toes. He is finished his terbinafine and I believe is on doxycycline and rifampin still as prescribed by dermatology. We have been using silver collagen to all his wounds although the patient reports that he thinks silver alginate does better on the wounds on his buttock. 4/13; the area on his left lateral calf about the same size but it did not require debridement. Left dorsal foot just proximal to the webspace between the first and second toes is about the same. Still nonviable surface. I note some superficial bronze discoloration of the dorsal part of his foot Right dorsal foot just proximal to the first and second toes also looks about the same. I still think there may be the same discoloration I noted above on the left Left buttock wound looks about the same 4/20; left lateral calf appears to be gradually contracting using silver collagen. 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 The left dorsal foot wound is debrided of tightly adherent necrotic debris and really cleans up quite nicely. The right area is worse with expansion. I did not debride this it is now over the base of the second toe The area on  his left buttock is smaller no debridement is required  using silver collagen 5/4; left calf continues to make good progress. He arrives with erythema around the wounds on his dorsal foot which even extends to the plantar aspect. Very concerning for coexistent infection. He is finished the erythromycin I gave him for possible erythrasma this does not seem to have helped. The area on the left foot is about the same base of the dorsal toes Is area on the buttock looks improved on the left 5/11; left calf and left buttock continued to make good progress. Left foot is about the same to slightly improved. Major problem is on the right foot. He has not had an x-ray. Deep tissue culture I did last week showed both Enterobacter and E. coli. I did not change the doxycycline I put him on empirically although neither 1 of these were plated to doxycycline. He arrives today with the erythema looking worse on both the dorsal and plantar foot. Macerated skin on the bottom of the foot. he has not been systemically unwell 5/18-Patient returns at 1 week, left calf wound appears to be making some progress, left buttock wound appears slightly worse than last time, left foot wound looks slightly better, right foot redness is marginally better. X-ray of both feet show no air or evidence of osteomyelitis. Patient is finished his Omnicef and terbinafine. He continues to have macerated skin on the bottom of the left foot as well as right 5/26; left calf wound is better, left buttock wound appears to have multiple small superficial open areas with surrounding macerated skin. X-rays that I did last time showed no evidence of osteomyelitis in either foot. He is finished cefdinir and doxycycline. I do not think that he was on terbinafine. He continues to have a large superficial open area on the right foot anterior dorsal and slightly between the first and second toes. I did send him to dermatology 2 months ago or so wondering about whether they would do a fungal scraping. I do  not believe they did but did do a culture. We have been using silver alginate to the toe areas, he has been using antifungals at home topically either ketoconazole or Lotrisone. We are using silver collagen on the left foot, silver alginate on the right, silver collagen on the left lateral leg and silver alginate on the left buttock 6/1; left buttock area is healed. We have the left dorsal foot, left lateral leg and right dorsal foot. We are using silver alginate to the areas on both feet and silver collagen to the area on his left lateral calf 6/8; the left buttock apparently reopened late last week. He is not really sure how this happened. He is tolerating the terbinafine. Using silver alginate to all wounds 6/15; left buttock wound is larger than last week but still superficial. Came in the clinic today with a report of purulence from the left lateral leg I did not identify any infection Both areas on his dorsal feet appear to be better. He is tolerating the terbinafine. Using silver alginate to all wounds 6/22; left buttock is about the same this week, left calf quite a bit better. His left foot is about the same however he comes in with erythema and warmth in the right forefoot once again. Culture that I gave him in the beginning of May showed Enterobacter and E. coli. I gave him doxycycline and things seem to improve although neither 1 of these organisms was specifically plated. 6/29;  left buttock is larger and dry this week. Left lateral calf looks to me to be improved. Left dorsal foot also somewhat improved right foot completely unchanged. The erythema on the right foot is still present. He is completing the Ceftin dinner that I gave him empirically [see discussion above.) 7/6 - All wounds look to be stable and perhaps improved, the left buttock wound is slightly smaller, per patient bleeds easily, completed ceftin, the right foot redness is less, he is on terbinafine 7/13; left buttock  wound about the same perhaps slightly narrower. Area on the left lateral leg continues to narrow. Left dorsal foot slightly smaller right foot about the same. We are using silver alginate on the right foot and Hydrofera Blue to the areas on the left. Unna boot on the left 2 layer compression on the right 7/20; left buttock wound absolutely the same. Area on lateral leg continues to get better. Left dorsal foot require debridement as did the right no major change in the 7/27; left buttock wound the same size necrotic debris over the surface. The area on the lateral leg is closed once again. His left foot looks better right foot about the same although there is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 06/21/16: lateral left leg combined wounds. Adherent surface slough area on the left dorsal foot at roughly the fourth toe looks improved  07/12/16; he now has a single linear wound on the lateral left leg. This does not look to be a lot changed from when I lost saw this. The area on his dorsal left foot looks considerably better however. 08/02/16; no major change in the substantial area on his left  lateral leg since last time. We have been using Hydrofera Blue for a prolonged period of time now. The area on his left foot is also unchanged from last review 07/19/16; the area on his dorsal foot on the left looks considerably smaller. He is beginning to have significant rims of epithelialization on the lateral left leg wound. This also looks better. 08/05/16; the patient came in for a nurse visit today. Apparently the area on his left lateral leg looks better and it was wrapped. However in general discussion the patient noted a new area on the dorsal aspect of his right second toe. The exact etiology of this is unclear but likely relates to pressure. 08/09/16 really the area on the left lateral leg did not really look that healthy today perhaps slightly larger and measurements. The area on his dorsal right second toe is improved also the left foot wound looks stable to improved 08/16/16; the area on the last lateral leg did not change any of dimensions. Post debridement with a curet the area looked better. Left foot wound improved and the area on the dorsal right second toe is improved 08/23/16; the area on the left lateral leg may be slightly smaller both in terms of length and width. Aggressive debridement with a curette afterwards the tissue appears healthier. Left foot wound appears improved in the area on the dorsal right second toe is improved 08/30/16 patient developed a fever over the weekend and was seen in an urgent care. Felt to have a UTI and put on doxycycline. He has been since changed over the phone to [facility]. After we took off the wrap on his right leg today the leg is swollen warm and erythematous, probably more likely the source of the fever 09/06/16; have been using collagen to the major left leg wound, silver alginate to the area on his anterior foot/toes 09/13/16; the areas on his anterior foot/toes on both sides appear to be virtually closed. Extensive wound on the  left lateral leg perhaps slightly narrower but each visit still covered an adherent surface slough 09/16/16 patient was in for his usual Thursday nurse visit however the intake nurse noted significant erythema of his dorsal right foot. He is also running a low-grade fever and having increasing spasms in the right leg 09/20/16 here for cellulitis involving his right great toes and forefoot. This is a lot better. Still requiring debridement on his left lateral leg. Santyl direct says he needs prior authorization. Therefore his wife cannot change this at home 09/30/16; the patient's extensive area on the left lateral calf and ankle perhaps somewhat better. Using Santyl. The area on the left toes is healed and I think the area on his right dorsal foot is healed as well. There is no cellulitis or venous inflammation involving the right leg. He is going to need compression stockings here. 10/07/16; the patient's extensive wound on the left lateral calf and ankle does not measure any differently however there appears to be less adherent surface slough using Santyl and aggressive weekly debridements 10/21/16; no major change in the area on the left lateral calf. Still the same measurement still very difficult to debridement adherent slough and nonviable subcutaneous tissue. This  is not really been helped by several weeks of Santyl. Previously for 2 weeks I used Iodoflex for a short period. A prolonged course of Hydrofera Blue didn't really help. I'm not sure why I only used 2 weeks of Iodoflex on this there is no evidence of surrounding infection. He has a small area on the right second toe which looks as though it's progressing towards closure 10/28/16; the wounds on his toes appear to be closed. No major change in the left lateral leg wound although the surface looks somewhat better using Iodoflex. He has had previous arterial studies that were normal. He has had reflux studies and is status post ablation  although I don't have any exact notes on which vein was ablated. I'll need to check the surgical record 11/04/16; he's had a reopening between the first and second toe on the left and right. No major change in the left lateral leg wound. There is what appears to be cellulitis of the left dorsal foot 11/18/16 the patient was hospitalized initially in [place] and then subsequently transferred to [facility] long and was admitted there from 11/09/16 through 11/12/16. He had developed progressive cellulitis on the right leg in spite of the doxycycline I gave him. I'd spoken to the hospitalist in [place] who was concerned about continuing leukocytosis. CT scan is what I suggested this was done which showed soft tissue swelling without evidence of osteomyelitis or an underlying abscess blood cultures were negative. At [facility] he was treated with vancomycin and Primaxin and then add an infectious disease consult. He was transitioned to Ceftaroline. He has been making progressive improvement. Overall a severe cellulitis of the right leg. He is been using silver alginate to her original wound on the left leg. The wounds in his toes on the right are closed there is a small open area on the base of the left second toe 11/26/15; the patient's right leg is much better although there is still some edema here this could be reminiscent from his severe cellulitis likely on top of some degree of lymphedema. His left anterior leg wound has less surface slough as reported by her intake nurse. Small wound at the base of the left second toe 12/02/16; patient's right leg is better and there is no open wound here. His left anterior lateral leg wound continues to have a healthy-looking surface. Small wound at the base of the left second toe however there is erythema in the left forefoot which is worrisome 12/16/16; is no open wounds on his right leg. We took measurements for stockings. His left anterior lateral leg wound  continues to have a healthy-looking surface. I'm not sure where we were with the Apligraf run through his insurance. We have been using Iodoflex. He has a thick eschar on the left first second toe interface, I suspect this may be fungal however there is no visible open 12/23/16; no open wound on his right leg. He has 2 small areas left of the linear wound that was remaining last week. We have been using Prisma, I thought I have disclosed this week, we can only look forward to next week 01/03/17; the patient had concerning areas of erythema last week, already on doxycycline for UTI through his primary doctor. The erythema is absolutely no better there is warmth and swelling both medially from the left lateral leg wound and also the dorsal left foot. 01/06/17- Patient is here for follow-up evaluation of his left lateral leg ulcer and bilateral feet ulcers. He  is on oral antibiotic therapy, tolerating that. Nursing staff and the patient states that the erythema is improved from Monday. 01/13/17; the predominant left lateral leg wound continues to be problematic. I had put Apligraf on him earlier this month once. However he subsequently developed what appeared to be an intense cellulitis around the left lateral leg wound. I gave him Dalvance I think on 2/12 perhaps 2/13 he continues on cefdinir. The erythema is still present but the warmth and swelling is improved. I am hopeful that the cellulitis part of this control. I wouldn't be surprised if there is an element of venous inflammation as well. 01/17/17. The erythema is present but better in the left leg. His left lateral leg wound still does not have a viable surface buttons certain parts of this long thin wound it appears like there has been improvement in dimensions. 01/20/17; the erythema still present but much better in the left leg. I'm thinking this is his usual degree of chronic venous inflammation. The wound on the left leg looks somewhat better. Is  less surface slough 01/27/17; erythema is back to the chronic venous inflammation. The wound on the left leg is somewhat better. I am back to the point where I like to try an Apligraf once again 02/10/17; slight improvement in wound dimensions. Apligraf #2. He is completing his doxycycline 02/14/17; patient arrives today having completed doxycycline last Thursday. This was supposed to be a nurse visit however once again he hasn't tense erythema from the medial part of his wound extending over the lower leg. Also erythema in his foot this is roughly in the same distribution as last time. He has baseline chronic venous inflammation however this is a lot worse than the baseline I have learned to accept the on him is baseline inflammation 02/24/17- patient is here for follow-up evaluation. He is tolerating compression therapy. His voicing no complaints or concerns he is here anticipating an Apligraf 03/03/17; he arrives today with an adherent necrotic surface. I don't think this is surface is going to be amenable for Apligraf's. The erythema around his wound and on the left dorsal foot has resolved he is off antibiotics 03/10/17; better-looking surface today. I don't think he can tolerate Apligraf's. He tells me he had a wound VAC after a skin graft years ago to this area and they had difficulty with a seal. The erythema continues to be stable around this some degree of chronic venous inflammation but he also has recurrent cellulitis. We have been using Iodoflex 03/17/17; continued improvement in the surface and may be small changes in dimensions. Using Iodoflex which seems the only thing that will control his surface 03/24/17- He is here for follow up evaluation of his LLE lateral ulceration and ulcer to right dorsal foot/toe space. He is voicing no complaints or concerns, He is tolerating compression wrap. 03/31/17 arrives today with a much healthier looking wound on the left lower extremity. We have been  using Iodoflex for a prolonged period of time which has for the first time prepared and adequate looking wound bed although we have not had much in the way of wound dimension improvement. He also has a small wound between the first and second toe on the right 04/07/17; arrives today with a healthy-looking wound bed and at least the top 50% of this wound appears to be now her. No debridement was required I have changed him to [facility] last week after prolonged Iodoflex. He did not do well with Apligraf's. We've  had a re-opening between the first and second toe on the right 04/14/17; arrives today with a healthier looking wound bed contractions and the top 50% of this wound and some on the lesser 50%. Wound bed appears healthy. The area between the first and second toe on the right still remains problematic 04/21/17; continued very gradual improvement. Using [facility] 04/28/17; continued very gradual improvement in the left lateral leg venous insufficiency wound. His periwound erythema is very mild. We have been using Hydrofera Blue. Wound is making progress especially in the superior 50% 05/05/17; he continues to have very gradual improvement in the left lateral venous insufficiency wound. Both in terms with an length rings are improving. I debrided this every 2 weeks with #5 curet and we have been using Hydrofera Blue and again making good progress With regards to the wounds between his right first and second toe which I thought might of been tinea pedis he is not making as much progress very dry scaly skin over the area. Also the area at the base of the left first and second toe in a similar condition 05/12/17; continued gradual improvement in the refractory left lateral venous insufficiency wound on the left. Dimension smaller. Surface still requiring debridement using Hydrofera Blue 05/19/17; continued gradual improvement in the refractory left lateral venous ulceration. Careful  inspection of the wound bed underlying rumination suggested some degree of epithelialization over the surface no debridement indicated. Continue Hydrofera Blue difficult areas between his toes first and third on the left than first and second on the right. I'm going to change to silver alginate from silver collagen. Continue ketoconazole as I suspect underlying tinea pedis 05/26/17; left lateral leg venous insufficiency wound. We've been using Hydrofera Blue. I believe that there is expanding epithelialization over the surface of the wound albeit not coming from the wound circumference. This is a bit of an odd situation in which the epithelialization seems to be coming from the surface of the wound rather than in the exact circumference. There is still small open areas mostly along the lateral margin of the wound. ooHe has unchanged areas between the left first and second and the right first second toes which I been treating for tenia pedis 06/02/17; left lateral leg venous insufficiency wound. We have been using Hydrofera Blue. Somewhat smaller from the wound circumference. The surface of the wound remains a bit on it almost epithelialized sedation in appearance. I use an open curette today debridement in the surface of all of this especially the edges ooSmall open wounds remaining on the dorsal right first and second toe interspace and the plantar left first second toe and her face on the left 06/09/17; wound on the left lateral leg continues to be smaller but very gradual and very dry surface using Hydrofera Blue 06/16/17 requires weekly debridements now on the left lateral leg although this continues to contract. I changed to silver collagen last week because of dryness of the wound bed. Using Iodoflex to the areas on his first and second toes/web space bilaterally 06/24/17; patient with history of paraplegia also chronic venous insufficiency with lymphedema. Has a very difficult wound on the  left lateral leg. This has been gradually reducing in terms of with but comes in with a very dry adherent surface. High switch to silver collagen a week or so ago with hydrogel to keep the area moist. This is been refractory to multiple dressing attempts. He also has areas in his first and second toes bilaterally in  the anterior and posterior web space. I had been using Iodoflex here after a prolonged course of silver alginate with ketoconazole was ineffective [question tinea pedis] 07/14/17; patient arrives today with a very difficult adherent material over his left lateral lower leg wound. He also has surrounding erythema and poorly controlled edema. He was switched his Santyl last visit which the nurses are applying once during his doctor visit and once on a nurse visit. He was also reduced to 2 layer compression I'm not exactly sure of the issue here. 07/21/17; better surface today after 1 week of Iodoflex. Significant cellulitis that we treated last week also better. [Doxycycline] 07/28/17 better surface today with now 2 weeks of Iodoflex. Significant cellulitis treated with doxycycline. He has now completed the doxycycline and he is back to his usual degree of chronic venous inflammation/stasis dermatitis. He reminds me he has had ablations surgery here 08/04/17; continued improvement with Iodoflex to the left lateral leg wound in terms of the surface of the wound although the dimensions are better. 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 Problematic areas on the plantar aspect of the first second toe web space on the left and the dorsal aspect of the first second toe web space on the right. At one point I felt these were probably related to chronic fungal infections in treated him aggressively for this although we have not made any improvement here. 08/11/17; left lateral leg. Surface continues to improve with the Iodoflex  although we are not seeing much improvement in overall wound dimensions. Areas on his plantar left foot and right foot show no improvement. In fact the right foot looks somewhat worse 08/18/17; left lateral leg. We changed to [facility] last week after a prolonged course of Iodoflex which helps get the surface better. It appears that the wound with is improved. Continue with difficult areas on the left dorsal first second and plantar first second on the right 09/01/17; patient arrives in clinic today having had a temperature of 103 yesterday. He was seen in the ER and [facility]. The patient was concerned he could have cellulitis again in the right leg however they diagnosed him with a UTI and he is now on Keflex. He has a history of cellulitis which is been recurrent and difficult but this is been in the left leg, in the past 5 use doxycycline. He does in and out catheterizations at home which are risk factors for UTI 09/08/17; patient will be completing his Keflex this weekend. The erythema on the left leg is considerably better. He has a new wound today on the medial part of the right leg small superficial almost looks like a skin tear. He has worsening of the area on the right dorsal first and second toe. His major area on the left lateral leg is better. Using Hydrofera Blue on all areas 09/15/17; gradual reduction in width on the long wound in the left lateral leg. No debridement required. He also has wounds on the plantar aspect of his left first second toe web space and on the dorsal aspect of the right first second toe web space. 09/22/17; there continues to be very gradual improvements in the dimensions of the left lateral leg wound. He hasn't round erythematous spot with might be pressure on his wheelchair. There is no evidence obviously of infection no purulence no warmth ooHe has a dry scaled area on the plantar aspect of the left first second  toe ooImproved area on the dorsal  right first second toe. 09/29/17; left lateral leg wound continues to improve in dimensions mostly with an is still a fairly long but increasingly narrow wound. ooHe has a dry scaled area on the plantar aspect of his left first second toe web space ooIncreasingly concerning area on the dorsal right first second toe. In fact I am concerned today about possible cellulitis around this wound. The areas extending up his second toe and although there is deformities here almost appears to abut on the nailbed. 10/06/17; left lateral leg wound continues to make very gradual progress. Tissue culture I did from the right first second toe dorsal foot last time grew MRSA and enterococcus which was vancomycin sensitive. This was not sensitive to clindamycin or doxycycline. He is allergic to Zyvox and sulfa we have therefore arrange for him to have dalvance infusion tomorrow. He is had this in the past and tolerated it well 10/20/17; left lateral leg wound continues to make decent progress. This is certainly reduced in terms of with there is advancing epithelialization.ooThe cellulitis in the right foot looks better although he still has a deep wound in the dorsal aspect of the first second toe web space. Plantar left first toe web space on the left I think is making some progress 10/27/17; left lateral leg wound continues to make decent progress. Advancing epithelialization.using Hydrofera Blue ooThe right first second toe web space wound is better-looking using silver alginate ooImprovement in the left plantar first second toe web space. Again using silver alginate 11/03/17 left lateral leg wound continues to make decent progress albeit slowly. Using Hydrofera Blue ooThe right per second toe web space continues to be a very problematic looking punched out wound. I obtained a piece of tissue for deep culture I did extensively treated this for fungus. It is difficult to imagine that this is a pressure area  as the patient states other than going outside he doesn't really wear shoes at home ooThe left plantar first second toe web space looked fairly senescent. Necrotic edges. This required debridement oochange to Hydrofera Blue to all wound areas 11/10/17; left lateral leg wound continues to contract. Using Hydrofera Blue ooOn the right dorsal first second toe web space dorsally. Culture I did of this area last week grew MRSA there is not an easy oral option in this patient was multiple antibiotic allergies or intolerances. This was only a rare culture isolate I'm therefore going to use Bactroban under silver alginate ooOn the left plantar first second toe web space. Debridement is required here. This is also unchanged 11/17/17; left lateral leg wound continues to contract using Hydrofera Blue this is no longer the major issue. ooThe major concern here is the right first second toe web space. He now has an open area going from dorsally to the plantar aspect. There is now wound on the inner lateral part of the first toe. Not a very viable surface on this. There is erythema spreading medially into the forefoot. ooNo major change in the left first second toe plantar wound 11/24/17; left lateral leg wound continues to contract using Hydrofera Blue. Nice improvement today ooThe right first second toe web space all of this looks a lot less angry than last week. I have given him clindamycin and topical Bactroban for MRSA and terbinafine for the possibility of underlining tinea pedis that I could not control with ketoconazole. Looks somewhat better ooThe area on the plantar left first second toe web space is  weeping with dried debris around the wound 12/01/17; left lateral leg wound continues to contract he Hydrofera Blue. It is becoming thinner in terms of with nevertheless it is making good improvement. ooThe right first second toe web space looks less angry but still a large necrotic-looking wounds  starting on the plantar aspect of the right foot extending between the toes and now extensively on the base of the right second toe. I gave him clindamycin and topical Bactroban for MRSA anterior benefiting for the possibility of underlying tinea pedis. Not looking better today ooThe area on the left first/second toe looks better. Debrided of necrotic debris 12/05/17* the patient was worked in urgently today because over the weekend he found blood on his incontinence bad when he woke up. He was found to have an ulcer by his wife who does most of his wound care. He came in today for [place] to look at this. He has not had a history of wounds in his buttocks in spite of his paraplegia. 12/08/17; seen in follow-up today at his usual appointment. He was seen earlier this week and found to have a new wound on his buttock. We also follow him for wounds on the left lateral leg, left first second toe web space and right first second toe web space 12/15/17; we have been using Hydrofera Blue to the left lateral leg which has improved. The right first second toe web space has also improved. Left first second toe web space plantar aspect looks stable. The left buttock has worsened using Santyl. Apparently the buttock has drainage 12/22/17; we have been using Hydrofera Blue to the left lateral leg which continues to improve now 2 small wounds separated by normal skin. He tells [place] he had a fever up to 100 yesterday he is prone to UTIs but has not noted anything different. He does in and out catheterizations. The area between the first and second toes today does not look good necrotic surface covered with what looks to be purulent drainage and erythema extending into the third toe. I had gotten this to something that I thought look better last time however it is not look good today. He also has a necrotic surface over the buttock wound which is expanded. I thought there might be infection under here so I removed a lot  of the surface with a #5 curet though nothing look like it really needed culturing. He is been using Santyl to this area 12/27/17; his original wound on the left lateral leg continues to improve using Hydrofera Blue. I gave him samples of Baxdella although he was unable to take them out of fear for an allergic reaction ["lump in his throat"].the culture I did of the purulent drainage from his second toe last week showed both enterococcus and a set Enterobacter I was also concerned about the erythema on the bottom of his foot although paradoxically although this looks somewhat better today. 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 We've been using silver alginate here. They came up today that he sleeps in a recliner, I'm not sure why but I asked him to stop this 01/03/18; his original wound we've been using Hydrofera Blue is now separated into 2 areas. ooUlcer on his left buttock is better he is off the recliner and sleeping in bed ooFinally both wound areas between his first and second toes also looks some better 01/10/18; his original wound  on the left lateral leg is now separated into 2 wounds we've been using Hydrofera Blue ooUlcer on his left buttock has some drainage. There is a small probing site going into muscle layer superiorly.using silver alginate -He arrives today with a deep tissue injury on the left heel ooThe wound on the dorsal aspect of his first second toe on the left looks a lot betterusing silver alginate ketoconazole ooThe area on the first second toe web space on the right also looks a lot bette 01/17/18; his original wound on the left lateral leg continues to progress using Hydrofera Blue ooUlcer on his left buttock also is smaller surface healthier except for a small probing site going into the muscle layer superiorly. 2.4 cm of tunneling in this area ooDTI on his left heel  we have only been offloading. Looks better than last week no threatened open no evidence of infection oothe wound on the dorsal aspect of the first second toe on the left continues to look like it's regressing we have only been using silver alginate and terbinafine orally ooThe area in the first second toe web space on the right also looks to be a lot better using silver alginate and terbinafine I think this was prompted by tinea pedis 01/31/18; the patient was hospitalized in [place] last week apparently for a complicated UTI. He was discharged on cefepime he does in and out catheterizations. In the hospital he was discovered M I don't mild elevation of AST and ALTs and the terbinafine was stopped.predictably the pressure ulcer on his buttock looks betterusing silver alginate. The area on the left lateral leg also is better using Hydrofera Blue. The area between the first and second toes on the left better. First and second toes on the right still substantial but better. Finally the DTI on the left heel has held together and looks like it's resolving 02/07/18-he is here in follow-up evaluation for multiple ulcerations. He has new injury to the lateral aspect of the last issue a pressure ulcer, he states this is from adhesive removal trauma. He states he has tried multiple adhesive products with no success. All other ulcers appear stable. The left heel DTI is resolving. We will continue with same treatment plan and follow-up next week. 02/14/18; follow-up for multiple areas. ooHe has a new area last week on the lateral aspect of his pressure ulcer more over the posterior trochanter. The original pressure ulcer looks quite stable has healthy granulation. 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 Almost fully epithelialized on the original superior area using Hydrofera Blue ooDTI on the  left heel has peeled off this week to reveal a small superficial wound under denuded skin and subcutaneous tissue ooBoth areas between the first and second toes look better including nothing open on the left 02/21/18; ooThe patient's wounds on his left ischial tuberosity and posterior left greater trochanter actually looked better. He has a large area of irritation around the area which I think is contact dermatitis. I am doubtful that this is fungal ooHis original wound on the left lateral calf continues to improve we have been using Hydrofera Blue ooThere is no open area in the left first second toe web space although there is a lot of thick callus ooThe DTI on the left heel required debridement today of necrotic surface eschar and subcutaneous tissue using silver alginate ooFinally the area on the right first second  toe webspace continues to contract using silver alginate and ketoconazole 02/28/18 ooLeft ischial tuberosity wounds look better using silver alginate. ooOriginal wound on the left calf only has one small open area left using Hydrofera Blue ooDTI on the left heel required debridement mostly removing skin from around this wound surface. Using silver alginate ooThe areas on the right first/second toe web space using silver alginate and ketoconazole 03/08/18 on evaluation today patient appears to be doing decently well as best I can tell in regard to his wounds. This is the first time that I have seen him as he generally is followed by Dr. Dellia Nims. With that being said none of his wounds appear to be infected he does have an area where there is some skin covering what appears to be a new wound on the left dorsal surface of his great toe. This is right at the nail bed. With that being said I do believe that debrided away some of the excess skin can be of benefit in this regard. Otherwise he has been tolerating the dressing changes without complication. 03/14/18; patient arrives  today with the multiplicity of wounds that we are following. He has not been systemically unwell ooOriginal wound on the left lateral calf now only has 2 small open areas we've been using Hydrofera Blue which should continue ooThe deep tissue injury on the left heel requires debridement today. We've been using silver alginate ooThe left first second toe and the right first second toe are both are reminiscence what I think was tinea pedis. Apparently some of the callus Surface between the toes was removed last week when it started draining. ooPurulent drainage coming from the wound on the ischial tuberosity on the left. 03/21/18-He is here in follow-up evaluation for multiple wounds. 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 He tolerated debridement. The only change to last week's recommendations is to discontinue antifungal cream between toes. He will follow-up next week 03/28/18; following up for multiple wounds;Concern this week is streaking redness and swelling in the right foot. He is going to need antibiotics for this. 03/31/18; follow-up for right foot cellulitis. Streaking redness and swelling in the right foot on 03/28/18. 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 I put him on clindamycin 300 mg every 6 and brought him in for a quick check. He has an open wound between his first and second toes on the right foot as a potential source. 04/04/18; ooRight foot cellulitis is resolving he is completing clindamycin. This is truly good news ooLeft lateral calf wound which is initial wound only has one small open area inferiorly this is close to healing out. He has compression stockings. We will use Hydrofera Blue right down to the epithelialization of this ooNonviable surface on the left heel which was initially pressure with a DTI. We've been using Hydrofera Blue. I'm going to switch this back to  silver alginate ooLeft first second toe/tinea pedis this looks better using silver alginate ooRight first second toe tinea pedis using silver alginate ooLarge pressure ulcers on theLeft ischial tuberosity. Small wound here Looks better. I am uncertain about the surface over the large wound. Using silver alginate 04/11/18; ooCellulitis in the right foot is resolved ooLeft lateral calf wound which was his original wounds still has 2 tiny open areas remaining this is just about closed ooNonviable surface on the left heel is better but still requires debridement ooLeft first second toe/tinea pedis still  open using silver alginate ooRight first second toe wound tinea pedis I asked him to go back to using ketoconazole and silver alginate ooLarge pressure ulcers on the left ischial tuberosity this shear injury here is resolved. Wound is smaller. No evidence of infection using silver alginate 04/18/18; ooPatient arrives with an intense area of cellulitis in the right mid lower calf extending into the right heel area. Bright red and warm. Smaller area on the left anterior leg. He has a significant history of MRSA. He will definitely need antibioticsoodoxycycline ooHe now has 2 open areas on the left ischial tuberosity the original large wound and now a satellite area which I think was above his initial satellite areas. Not a wonderful surface on this satellite area surrounding erythema which looks like pressure related. ooHis left lateral calf wound again his original wound is just about closed ooLeft heel pressure injury still requiring debridement ooLeft first second toe looks a lot better using silver alginate ooRight first second toe also using silver alginate and ketoconazole cream also looks better 04/20/18; the patient was worked in early today out of concerns with his cellulitis on the right leg. I had started him on doxycycline. This was 2 days ago. His wife was concerned about  the swelling in the area. Also concerned about the left buttock. He has not been systemically unwell no fever chills. No nausea vomiting or diarrhea 04/25/18; the patient's left buttock wound is continued to deteriorate he is using Hydrofera Blue. He is still completing clindamycin for the cellulitis on the right leg although all of this looks better. 05/02/18 ooLeft buttock wound still with a lot of drainage and a very tightly adherent fibrinous necrotic surface. He has a deeper area superiorly ooThe left lateral calf wound is still closed ooDTI wound on the left heel necrotic surface especially the circumference using Iodoflex ooAreas between his left first second toe and right first second toe both look better. Dorsally and the right first second toe he had a necrotic surface although at smaller. In using silver alginate and ketoconazole. I did a culture last week which was a deep tissue culture of the reminiscence of the open wound on the right first second toe dorsally. This grew a few Acinetobacter and a few methicillin-resistant staph aureus. Nevertheless the area actually this week looked better. I didn't feel the need to specifically address this at least in terms of systemic antibiotics. 05/09/18; wounds are measuring larger more drainage per our intake. We are using Santyl covered with alginate on the large superficial buttock wounds, Iodosorb on the left heel, ketoconazole and silver alginate to the dorsal first and second toes bilaterally. 05/16/18; ooThe area on his left buttock better in some aspects although the area superiorly over the ischial tuberosity required an extensive debridement.using Santyl ooLeft heel appears stable. Using Iodoflex ooThe areas between his first and second toes are not bad however there is spreading erythema up the dorsal aspect of his left foot this looks like cellulitis again. He is insensate the erythema is really very brilliant.o Erysipelas He  went to see an allergist days ago because he was itching part of this he had lab work done. This showed a white count of 15.1 with 70% neutrophils. Hemoglobin of 11.4 and a platelet count of 659,000. Last white count we had in Epic was a 2-1/2 years ago which was 25.9 but he was ill at the time. He was able to show me some lab work that was done by his  primary physician the pattern is about the same. 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 But prompted me to go ahead and do x-rays of both feet and the pelvis rule out osteomyelitis. He also had a comprehensive metabolic panel this was reasonably normal his albumin was 3.7 liver function tests BUN/creatinine all normal 05/23/18; x-rays of both his feet from last week were negative for underlying pulmonary abnormality. The x-ray of his pelvis however showed mild irregularity in the left ischial which may represent some early osteomyelitis. The wound in the left ischial continues to get deeper clearly now exposed muscle. Each week necrotic surface material over this area. Whereas the rest of the wounds do not look so bad. ooThe left ischial wound we have been using Santyl and calcium alginate ooTo the left heel surface necrotic debris using Iodoflex ooThe left lateral leg is still healed ooAreas on the left dorsal foot and the right dorsal foot are about the same. There is some inflammation on the left which might represent contact dermatitis, fungal dermatitis I am doubtful cellulitis although this looks better than last week 05/30/18; CT scan done at Hospital did not show any osteomyelitis or abscess. Suggested the possibility of underlying cellulitis although I don't see a lot of evidence of this at the bedside ooThe wound itself on the left buttock/upper thigh actually looks somewhat better. No debridement ooLeft heel also looks better no debridement continue Iodoflex ooBoth dorsal first second  toe spaces appear better using Lotrisone. Left still required debridement 06/06/18; ooIntake reported some purulent looking drainage from the left gluteal wound. Using Santyl and calcium alginate ooLeft heel looks better although still a nonviable surface requiring debridement ooThe left dorsal foot first/second webspace actually expanding and somewhat deeper. I may consider doing a shave biopsy of this area ooRight dorsal foot first/second webspace appears stable to improved. Using Lotrisone and silver alginate to both these areas 06/13/18 ooLeft gluteal surface looks better. Now separated in the 2 wounds. No debridement required. Still drainage. We'll continue silver alginate ooLeft heel continues to look better with Iodoflex continue this for at least another week ooOf his dorsal foot wounds the area on the left still has some depth although it looks better than last week. We've been using Lotrisone and silver alginate 06/20/18 ooLeft gluteal continues to look better healthy tissue ooLeft heel continues to look better healthy granulation wound is smaller. He is using Iodoflex and his long as this continues continue the Iodoflex ooDorsal right foot looks better unfortunately dorsal left foot does not. There is swelling and erythema of his forefoot. He had minor trauma to this several days ago but doesn't think this was enough to have caused any tissue injury. Foot looks like cellulitis, we have had this problem before 06/27/18 on evaluation today patient appears to be doing a little worse in regard to his foot ulcer. 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 Both of which would really be his only options for treating this infection. In the past he has been given and effusion of Orbactiv. This is done very well for him in the past again it's one  time dosing IV antibiotic therapy. Subsequently I do believe this is something we're gonna need to see about doing at this point in time. Currently his other wounds seem to be  doing somewhat better in my pinion I'm pretty happy in that regard. 07/03/18 on evaluation today patient's wounds actually appear to be doing fairly well. He has been tolerating the dressing changes without complication. All in all he seems to be showing signs of improvement. In regard to the antibiotics he has been dealing with infectious disease since I saw him last week as far as getting this scheduled. In the end [facility]'s [facility] this coming Friday. In the meantime he has been continuing to perform the dressing changes in such as previous. There does not appear to be any evidence of infection worsengin at this time. 07/10/18; ooSince I last saw this man 2 weeks ago things have actually improved. IV antibiotics of resulted in less forefoot erythema although there is still some present. He is not systemically unwell ooLeft buttock wounds o2 now have no depth there is increased epithelialization Using silver alginate ooLeft heel still requires debridement using Iodoflex ooLeft dorsal foot still with a sizable wound about the size of a border but healthy granulation ooRight dorsal foot still with a slitlike area using silver alginate 07/18/18; the patient's cellulitis in the left foot is improved in fact I think it is on its way to resolving. ooLeft buttock wounds o2 both look better although the larger one has hypertension granulation we've been using silver alginate ooLeft heel has some thick circumferential redundant skin over the wound edge which will need to be removed today we've been using Iodoflex ooLeft dorsal foot is still a sizable wound required debridement using silver alginate ooThe right dorsal foot is just about closed only a small open  area remains here 07/25/18; left foot cellulitis is resolved ooLeft buttock wounds o2 both look better. Hyper-granulation on the major area ooLeft heel as some debris over the surface but otherwise looks a healthier wound. Using silver collagen ooRight dorsal foot is just about closed 07/31/18; arrives with our intake nurse worried about purulent drainage from the buttock. We had hyper-granulation here last week ooHis buttock wounds o2 continue to look better ooLeft heel some debris over the surface but measuring smaller. ooRight dorsal foot unfortunately has openings between the toes ooLeft foot superficial wound looks less aggravated. 08/07/18 ooButtock wounds continue to look better although some of her granulation and the larger medial wound. silver alginate ooLeft heel continues to look a lot better.silver collagen ooLeft foot superficial wound looks less stable. Requires debridement. He has a new wound superficial area on the foot on the lateral dorsal foot. ooRight foot looks better using silver alginate without Lotrisone 08/14/2018; patient was in the ER last week diagnosed with a UTI. He is now on Cefpodoxime and Macrodantin. ooButtock wounds continued to be smaller. Using silver alginate ooLeft heel continues to look better using silver collagen ooLeft foot superficial wound looks as though it is improving ooRight dorsal foot area is just about healed. 08/21/2018; patient is completed his antibiotics for his UTI. ooHe has 2 open areas on the buttocks. There is still not closed although the surface looks satisfactory. Using silver alginate ooLeft heel continues to improve using silver collagen ooThe bilateral dorsal foot areas which are at the base of his first and second toes/possible tinea pedis are actually stable on the left but worse on the right. The area on the left required debridement of necrotic surface. After debridement I obtained a specimen for PCR  culture. ooThe right dorsal foot which is been  just about healed last week is now reopened 08/28/2018; culture done on the left dorsal foot showed coag negative staph both staph epidermidis and Lugdunensis. I think this is worthwhile initiating systemic treatment. I will use doxycycline given his long list of allergies. The area on the left heel slightly improved but still requiring debridement. ooThe large wound on the buttock is just about closed whereas the smaller one is larger. Using silver alginate in this area 09/04/2018; patient is completing his doxycycline for the left foot although this continues to be a very difficult wound area with very adherent necrotic debris. We are using silver alginate to all his wounds right foot left foot and the small wounds on his buttock, silver collagen on the left heel. 09/11/2018; once again this patient has intense erythema and swelling of the left forefoot. Lesser degrees of erythema in the right foot. He has a long list of allergies and intolerances. I will reinstitute doxycycline. 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 Using silver alginate ooLeft heel also looks better using silver collagen ooUnfortunately both the areas on his feet look worse. The area on the left first second webspace is now gone through to the plantar part of his foot. The area on the left foot anteriorly is irritated with erythema and swelling in the forefoot. 09/25/2018 ooHis wound on the left plantar heel looks better. Using silver collagen ooThe area on the left buttock 2 small remnant areas. One is closed one is still open. Using silver alginate ooThe areas between both his first and second toes look worse. This in spite of long-standing antifungal therapy with ketoconazole and silver alginate which should have antifungal activity ooHe has small areas around his original wound on the left calf one is on the  bottom of the original scar tissue and one superiorly both of these are small and superficial but again given wound history in this site this is worrisome 10/02/2018 ooLeft plantar heel continues to gradually contract using silver collagen ooLeft buttock wound is unchanged using silver alginate ooThe areas on his dorsal feet between his first and second toes bilaterally look about the same. 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 Small open area here. We have been using silver alginate 10/09/2018; ooLeft plantar heel continues to look healthy and contract. No debridement is required ooLeft buttock slightly smaller with a tape injury wound just below which was new this week ooDorsal feet somewhat improved I have been using clindamycin ooLeft lateral looks lower extremity the actual open area looks worse although a lot of this is epithelialized. I am going to change to silver collagen today He has a lot more swelling in the right leg although this is not pitting not red and not particularly warm there is a lot of spasm in the right leg usually indicative of people with paralysis of some underlying discomfort. We have reviewed his vascular status from 2017 he had a left greater saphenous vein ablation. I wonder about referring him back to vascular surgery if the area on the left leg continues to deteriorate. 10/16/2018 in today for follow-up and management of multiple lower extremity ulcers. His left Buttock wound is much lower smaller and almost closed completely. The wound to the left ankle has began to reopen with Epithelialization  and some adherent slough. He has multiple new areas to the left foot and leg. The left dorsal foot without much improvement. Wound present between left great webspace and 2nd  toe. Erythema and edema present right leg. Right LE ultrasound obtained on 10/10/18 was negative for DVT. 10/23/2018; ooLeft buttock is closed over. Still dry macerated skin but there is no open wound. I suspect this is chronic pressure/moisture ooLeft lateral calf is quite a bit worse than when I saw this last. There is clearly drainage here he has macerated skin into the left plantar heel. We will change the primary dressing to alginate ooLeft dorsal foot has some improvement in overall wound area. Still using clindamycin and silver alginate ooRight dorsal foot about the same as the left using clindamycin and silver alginate ooThe erythema in the right leg has resolved. He is DVT rule out was negative ooLeft heel pressure area required debridement although the wound is smaller and the surface is health 10/26/2018 ooThe patient came back in for his nurse check today predominantly because of the drainage coming out of the left lateral leg with a recent reopening of his original wound on the left lateral calf. He comes in today with a large amount of surrounding erythema around the wound extending from the calf into the ankle and even in the area on the dorsal foot. He is not systemically unwell. He is not febrile. Nevertheless this looks like cellulitis. We have been using silver alginate to the area. I changed him to a regular visit and I am going to prescribe him doxycycline. The rationale here is a long list of medication intolerances and a history of MRSA. I did not see anything that I thought would provide a valuable culture 10/30/2018 ooFollow-up from his appointment 4 days ago with really an extensive area of cellulitis in the left calf left lateral ankle and left dorsal foot. I put him on doxycycline. He has a long list of medication allergies which are true allergy reactions. 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 In any case he is a lot better today. The erythema especially in the anterior and lateral left calf is better. He still has left ankle erythema. He also is complaining about increasing edema in the right leg we have only been using Kerlix Coban and he has been doing the wraps at home. Finally he has a spotty rash on the medial part of his upper left calf which looks like folliculitis or perhaps wrap occlusion type injury. Small superficial macules not pustules 11/06/18 patient arrives today with again a considerable degree of erythema around the wound on the left lateral calf extending into the dorsal ankle and dorsal foot. This is a lot worse than when I saw this last week. He is on doxycycline really with not a lot of improvement. He has not been systemically unwell Wounds on the; left heel actually looks improved. Original area on the left foot and proximity to the first and second toes looks about the same. He has superficial areas on the dorsal foot, anterior calf and then the reopening of his original wound on the left lateral calf which looks about the same ooThe only area he has on the right is the dorsal webspace first and second which is smaller. ooHe has a large area of dry erythematous skin on the left buttock small open area here. 11/13/2018; the patient arrives in much better condition. The  erythema around the wound on the left lateral calf is a lot better. Not sure whether this was the clindamycin or the TCA and ketoconazole or just in the improvement in edema control [stasis dermatitis]. In any case this is a lot better. The area on the left heel is very small and just about resolved using silver collagen we have been using silver alginate to the areas on his dorsal feet 11/20/2018; his wounds include the left lateral calf, left heel, dorsal aspects of both feet just proximal to the first second webspace. He is stable to slightly improved. I did not think  any changes to his dressings were going to be necessary 11/27/2018 he has a reopening on the left buttock which is surrounded by what looks like tinea or perhaps some other form of dermatitis. The area on the left dorsal foot has some erythema around it I have marked this area but I am not sure whether this is cellulitis or not. Left heel is not closed. Left calf the reopening is really slightly longer and probably worse 1/13; in general things look better and smaller except for the left dorsal foot. Area on the left heel is just about closed, left buttock looks better only a small wound remains in the skin looks better [using Lotrisone] 1/20; the area on the left heel only has a few remaining open areas here. Left lateral calf about the same in terms of size, left dorsal foot slightly larger right lateral foot still not closed. The area on the left buttock has no open wound and the surrounding skin looks a lot better 1/27; the area on the left heel is closed. Left lateral calf better but still requiring extensive debridements. The area on his left buttock is closed. He still has the open areas on the left dorsal foot which is slightly smaller in the right foot which is slightly expanded. We have been using Iodoflex on these areas as well 2/3; left heel is closed. Left lateral calf still requiring debridement using Iodoflex there is no open area on his left buttock however he has dry scaly skin over a large area of this. Not really responding well to the Lotrisone. Finally the areas on his dorsal feet at the level of the first second webspace are slightly smaller on the right and about the same on the left. Both of these vigorously debrided with Anasept and gauze 2/10; left heel remains closed he has dry erythematous skin over the left buttock but there is no open wound here. Left lateral leg has come in and with. Still requiring debridement we have been using Iodoflex here. Finally the area on the  left dorsal foot and right dorsal foot are really about the same extremely dry callused fissured areas. He does not yet have a dermatology appointment 2/17; left heel remains closed. He has a new open area on the left buttock. The area on the left lateral calf is bigger longer and still covered in necrotic debris. No major change in his foot areas bilaterally. I am awaiting for a dermatologist to look on this. We have been using ketoconazole I do not know that this is been doing any good at all. 2/24; left heel remains closed. The left buttock wound that was new reopening last week looks better. The left lateral calf appears better also although still requires debridement. The major area on his foot is the left first second also requiring debridement. We have been putting Prisma on all wounds. I  do not believe that the ketoconazole has done too much good for his feet. He will use Lotrisone I am going to give him a 2-week course of terbinafine. We still do not have a dermatology appointment 3/2 left heel remains closed however there is skin over bone in this area I pointed this out to him today. The left buttock wound is epithelialized but still does not look completely stable. The area on the left leg required debridement were using silver collagen here. With regards to his feet we changed to Lotrisone last week and silver alginate. 3/9; left heel remains closed. Left buttock remains closed. The area on the right foot is essentially closed. The left foot remains unchanged. Slightly smaller on the left lateral calf. Using silver collagen to both of these areas 3/16-Left heel remains closed. Area on right foot is closed. Left lateral calf above the lateral malleolus open wound requiring debridement with easy bleeding. Left dorsal wound proximal to first toe also debrided. Left ischial area open new. Patient has been using Prisma with wrapping every 3 days. Dermatology appointment is  apparently tomorrow.Patient has completed his terbinafine 2-week course with some apparent improvement according to him, there is still flaking and dry skin in his foot on the left 3/23; area on the right foot is reopened. The area on the left anterior foot is about the same still a very necrotic adherent surface. He still has the area on the left leg and reopening is on the left buttock. He apparently saw dermatology although I do not have a note. According to the patient who is usually fairly well informed they did not have any good ideas. Put him on oral terbinafine which she is been on before. 3/30; using silver collagen to all wounds. Apparently his dermatologist put him on doxycycline and rifampin presumably some culture grew staph. I do not have this result. He remains on terbinafine although I have used terbinafine on him before 4/6; patient has had a fairly substantial reopening on the right foot between the first and second toes. He is finished his terbinafine and I believe is on doxycycline and rifampin still as prescribed by dermatology. We have been using silver collagen to all his wounds although the patient reports that he thinks silver alginate does better on the wounds on his buttock. 4/13; the area on his left lateral calf about the same size but it did not require debridement. ooLeft dorsal foot just proximal to the webspace between the first and second toes is about the same. Still nonviable surface. I note some superficial bronze discoloration of the dorsal part of his foot ooRight dorsal foot just proximal to the first and second toes also looks about the same. I still think there may be the same discoloration I noted above on the left ooLeft buttock wound looks about the same 4/20; left lateral calf appears to be gradually contracting using silver collagen. 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 The left  dorsal foot wound is debrided of tightly adherent necrotic debris and really cleans up quite nicely. The right area is worse with expansion. I did not debride this it is now over the base of the second toe ooThe area on his left buttock is smaller no debridement is required using silver collagen 5/4; left calf continues to make good progress. ooHe arrives with erythema around the wounds on his dorsal foot which even extends to the plantar aspect. Very concerning for coexistent infection.  He is finished the erythromycin I gave him for possible erythrasma this does not seem to have helped. ooThe area on the left foot is about the same base of the dorsal toes ooIs area on the buttock looks improved on the left 5/11; left calf and left buttock continued to make good progress. Left foot is about the same to slightly improved. ooMajor problem is on the right foot. He has not had an x-ray. Deep tissue culture I did last week showed both Enterobacter and E. coli. I did not change the doxycycline I put him on empirically although neither 1 of these were plated to doxycycline. He arrives today with the erythema looking worse on both the dorsal and plantar foot. Macerated skin on the bottom of the foot. he has not been systemically unwell 5/18-Patient returns at 1 week, left calf wound appears to be making some progress, left buttock wound appears slightly worse than last time, left foot wound looks slightly better, right foot redness is marginally better. X-ray of both feet show no air or evidence of osteomyelitis. Patient is finished his Omnicef and terbinafine. He continues to have macerated skin on the bottom of the left foot as well as right 5/26; left calf wound is better, left buttock wound appears to have multiple small superficial open areas with surrounding macerated skin. X-rays that I did last time showed no evidence of osteomyelitis in either foot. He is finished cefdinir and doxycycline.  I do not think that he was on terbinafine. He continues to have a large superficial open area on the right foot anterior dorsal and slightly between the first and second toes. I did send him to dermatology 2 months ago or so wondering about whether they would do a fungal scraping. I do not believe they did but did do a culture. We have been using silver alginate to the toe areas, he has been using antifungals at home topically either ketoconazole or Lotrisone. We are using silver collagen on the left foot, silver alginate on the right, silver collagen on the left lateral leg and silver alginate on the left buttock 6/1; left buttock area is healed. We have the left dorsal foot, left lateral leg and right dorsal foot. We are using silver alginate to the areas on both feet and silver collagen to the area on his left lateral calf 6/8; the left buttock apparently reopened late last week. He is not really sure how this happened. He is tolerating the terbinafine. Using silver alginate to all wounds 6/15; left buttock wound is larger than last week but still superficial. ooCame in the clinic today with a report of purulence from the left lateral leg I did not identify any infection ooBoth areas on his dorsal feet appear to be better. He is tolerating the terbinafine. Using silver alginate to all wounds 6/22; left buttock is about the same this week, left calf quite a bit better. His left foot is about the same however he comes in with erythema and warmth in the right forefoot once again. Culture that I gave him in the beginning of May showed Enterobacter and E. coli. I gave him doxycycline and things seem to improve although neither 1 of these organisms was specifically plated. 6/29; left buttock is larger and dry this week. Left lateral calf looks to me to be improved. Left dorsal foot also somewhat improved right foot completely unchanged. The erythema on the right foot is still present. He  is completing the Ceftin dinner that  I gave him empirically [see discussion above.) 7/6 - All wounds look to be stable and perhaps improved, the left buttock wound is slightly smaller, per patient bleeds easily, completed ceftin, the right foot redness is less, he is on terbinafine 7/13; left buttock wound about the same perhaps slightly narrower. Area on the left lateral leg continues to narrow. Left dorsal foot slightly smaller right foot about the same. We are using silver alginate on the right foot and Hydrofera Blue to the areas on the left. Unna boot on the left 2 layer compression on the right 7/20; left buttock wound absolutely the same. Area on lateral leg continues to get better. Left dorsal foot require debridement as did the right no major change in the 7/27; left buttock wound the same size necrotic debris over the surface. The area on the lateral leg is closed once again. His left foot looks better right foot about the same although there is some involvement now of the posterior first second toe area. He is still on terbinafine which I have given him for a month, not certain a centimeter major change 06/25/19-All wounds appear to be slightly improved according to report, left buttock wound looks clean, both foot wounds have minimal to no debris the right dorsal foot has minimal slough. We are using Hydrofera Blue to the left and silver alginate to the right foot and ischial wound. 8/10-Wounds all appear to be around the same, the right forefoot distal part has some redness which was not there before, however the wound looks clean and small. Ischial wound looks about the same with no changes 8/17; his wound on the left lateral calf which was his original chronic venous insufficiency wound remains closed. Since I last saw him the areas on the left dorsal foot right dorsal foot generally appear better but require debridement. The area on his left initial tuberosity appears somewhat  larger to me perhaps hyper granulated and bleeds very easily. We have been using Hydrofera Blue to the left dorsal foot and silver alginate to everything else 8/24; left lateral calf remains closed. The areas on his dorsal feet on the webspace of the first and second toes bilaterally both look better. The area on the left buttock which is the pressure ulcer stage II slightly smaller. I change the dressing to Hydrofera Blue to all areas 8/31; left lateral calf remains closed. The area on his dorsal feet bilaterally look better. Using Hydrofera Blue. Still requiring debridement on the left foot. No change in the left buttock pressure ulcers however 9/14; left lateral calf remains closed. Dorsal feet look quite a bit better than 2 weeks ago. Flaking dry skin also a lot better with the ammonium lactate I gave him 2 weeks ago. The area on the left buttock is improved. He states that his Roho cushion developed a leak and he is getting a new one, in the interim he is offloading this vigorously 9/21; left calf remains closed. Left heel which was a possible DTI looks better this week. He had macerated tissue around the left dorsal foot right foot looks satisfactory and improved left buttock wound. I changed his dressings to his feet to silver alginate bilaterally. Continuing Hydrofera Blue on the left buttock. 9/28 left calf remains closed. Left heel did not develop anything [possible DTI] dry flaking skin on the left dorsal foot. Right foot looks satisfactory. Improved left buttock wound. We are using silver alginate on his feet Hydrofera Blue on the buttock. I have asked  him to go back to the Lotrisone on his feet including the wounds and surrounding areas 10/5; left calf remains closed. The areas on the left and right feet about the same. A lot of this is epithelialized however debris over the remaining open areas. He is using Lotrisone and silver alginate. The area on the left buttock using Hydrofera  Blue 10/26. Patient has been out for 3 weeks secondary to Covid concerns. He tested negative but I think his wife tested positive. He comes in today with the left foot substantially worse, right foot about the same. Even more concerning he states that the area on his left buttock closed over but then reopened and is considerably deeper in one aspect than it was before [stage III wound] 11/2; left foot really about the same as last week. Quarter sized wound on the dorsal foot just proximal to the first second toes. Surrounding erythema with areas of denuded epithelium. This is not really much different looking. Did not look like cellulitis this time however. ooRight foot area about the same.. We have been using silver alginate alginate on his toes ooLeft buttock still substantial irritated skin around the wound which I think looks somewhat better. We have been using Hydrofera Blue here. 11/9; left foot larger than last week and a very necrotic surface. Right foot I think is about the same perhaps slightly smaller. Debris around the circumference also addressed. Unfortunately on the left buttock there is been a decline. Satellite lesions below the major wound distally and now a an additional one posteriorly we have been using Hydrofera Blue but I think this is a pressure issue 11/16; left foot ulcer dorsally again a very adherent necrotic surface. Right foot is about the same. Not much change in the pressure ulcer on his left buttock. 11/30; left foot ulcer dorsally basically the same as when I saw him 2 weeks ago. Very adherent fibrinous debris on the wound surface. Patient reports a lot of drainage as well. 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 We have been using Iodoflex, patient changed back to alginate because of the drainage. Area on his right dorsal foot really looks benign with a healthier surface certainly a lot better than on the  left. Left buttock wounds all improved using Hydrofera Blue 12/7; left dorsal foot again no improvement. Tightly adherent debris. PCR culture I did last week only showed likely skin contaminant. I have gone ahead and done a punch biopsy of this which is about the last thing in terms of investigations I can think to do. He has known venous insufficiency and venous hypertension and this could be the issue here. The area on the right foot is about the same left buttock slightly worse according to our intake nurse secondary to [facility] Blue sticking to the wound 12/14; biopsy of the left foot that I did last time showed changes that could be related to wound healing/chronic stasis dermatitis phenomenon no neoplasm. We have been using silver alginate to both feet. I change the one on the left today to Sorbact and silver alginate to his other 2 wounds 12/28; the patient arrives with the following problems; ooMajor issue is the dorsal left foot which continues to be a larger deeper wound area. Still with a completely nonviable surface ooParadoxically the area mirror image on the right on the right dorsal foot appears to be getting better. ooHe had some loss of dry denuded skin from the lower part  of his original wound on the left lateral calf. Some of this area looked a little vulnerable and for this reason we put him in wrap that on this side this week ooThe area on his left buttock is larger. He still has the erythematous circular area which I think is a combination of pressure, sweat. This does not look like cellulitis or fungal dermatitis 11/26/2019; -Dorsal left foot large open wound with depth. Still debris over the surface. Using Sorbact ooThe area on the dorsal right foot paradoxically has closed over [facility] has a reopening on the left ankle laterally at the base of his original wound that extended up into the calf. This appears clean. ooThe left buttock wound is smaller but with very  adherent necrotic debris over the surface. We have been using silver alginate here as well The patient had arterial studies done in 2017. He had biphasic waveforms at the dorsalis pedis and posterior tibial bilaterally. ABI in the left was 1.17. Digit waveforms were dampened. He has slight spasticity in the great toes I do not think a TBI would be possible 1/11; the patient comes in today with a sizable reopening between the first and second toes on the right. This is not exactly in the same location where we have been treating wounds previously. According to our intake nurse this was actually fairly deep but 0.6 cm. The area on the left dorsal foot looks about the same the surface is somewhat cleaner using Sorbact, his MRI is in 2 days. We have not managed yet to get arterial studies. The new reopening on the left lateral calf looks somewhat better using alginate. The left buttock wound is about the same using alginate 1/18; the patient had his ARTERIAL studies which were quite normal. ABI in the right at 1.13 with triphasic/biphasic waveforms on the left ABI 1.06 again with triphasic/biphasic waveforms. It would not have been possible to have done a toe brachial index because of spasticity. We have been using Sorbac to the left foot alginate to the rest of his wounds on the right foot left lateral calf and left buttock 1/25; arrives in clinic with erythema and swelling of the left forefoot worse over the first MTP area. This extends laterally dorsally and but also posteriorly. Still has an area on the left lateral part of the lower part of his calf wound it is eschared and clearly not closed. ooArea on the left buttock still with surrounding irritation and erythema. ooRight foot surface wound dorsally. The area between the right and first and second toes appears better. 2/1; ooThe left foot wound is about the same. Erythema slightly better I gave him a week of doxycycline  empirically ooRight foot wound is more extensive extending between the toes to the plantar surface ooLeft lateral calf really no open surface on the inferior part of his original wound however the entire area still looks vulnerable ooAbsolutely no improvement in the left buttock wound required debridement. 2/8; the left foot is about the same. Erythema is slightly improved I gave him clindamycin last week. ooRight foot looks better he is using Lotrimin and silver alginate ooHe has a breakdown in the left lateral calf. Denuded epithelium which I have removed ooLeft buttock about the same were using Hydrofera Blue Objective Constitutional Sitting or standing Blood Pressure is within target range for patient.. Pulse regular and within target range for patient.Marland Kitchen Respirations regular, non-labored and within target range.. Temperature is normal and within the target range for the patient.Marland Kitchen  Appears in no distress. Vitals Time Taken: 8:00 AM, Height: 70 in, Weight: 216 lbs, BMI: 31, Temperature: 98.1 F, Pulse: 101 bpm, Respiratory Rate: 20 breaths/min, Blood Pressure: 119/72 mmHg. General Notes: Wound exam ooLeft dorsal foot perhaps somewhat better wound surface. Debrided with a #5 curette to get this something viable. There is still erythema in this area although it is less intense than 2 weeks ago he has completed 2 weeks of antibiotics - Right dorsal foot looks a lot better ooHe is reopened in the distal part of the left calf. Debridement here of callused skin. There is this was not deep but it is vulnerable ooLeft buttock I do not know if there is much change here. Integumentary (Hair, Skin) Wound #24 status is Open. Original cause of wound was Gradually Appeared. The wound is located on the Left,Dorsal Foot. The wound measures 3cm length x 2.5cm width x 0.3cm depth; 5.89cm^2 area and 1.767cm^3 volume. There is Fat Layer (Subcutaneous Tissue) Exposed exposed. There is no tunneling or  undermining noted. There is a large amount of serosanguineous drainage noted. The wound margin is distinct with the outline attached to the wound base. There is small (1-33%) red granulation within the wound bed. There is a large (67-100%) amount of necrotic tissue within the wound bed including Adherent Slough. Wound #35 status is Open. Original cause of wound was Gradually Appeared. The wound is located on the Left Ischium. The wound measures 4cm length x 1.5cm width x 0.2cm depth; 4.712cm^2 area and 0.942cm^3 volume. There is Fat Layer (Subcutaneous Tissue) Exposed exposed. There is no tunneling or undermining noted. There is a medium amount of serosanguineous drainage noted. The wound margin is flat and intact. There is large (67-100%) red, friable granulation within the wound bed. There is no necrotic tissue within the wound bed. Wound #37 status is Open. Original cause of wound was Gradually Appeared. The wound is located on the Left,Lateral Malleolus. The wound measures 2cm length x 0.6cm width x 0.2cm depth; 0.942cm^2 area and 0.188cm^3 volume. There is Fat Layer (Subcutaneous Tissue) Exposed exposed. There is no tunneling or undermining noted. There is a medium amount of serosanguineous drainage noted. The wound margin is distinct with the outline attached to the wound base. There is medium (34-66%) pink, pale granulation within the wound bed. There is a medium (34-66%) amount of necrotic tissue within the wound bed including Adherent Slough. Wound #38 status is Open. Original cause of wound was Gradually Appeared. The wound is located on the Right Toe - Web between 1st and 2nd. The wound measures 5cm length x 3.5cm width x 0.4cm depth; 13.744cm^2 area and 5.498cm^3 volume. There is Fat Layer (Subcutaneous Tissue) Exposed exposed. There is no tunneling or undermining noted. There is a medium amount of serosanguineous drainage noted. The wound margin is distinct with the outline attached  to the wound base. There is medium (34-66%) pink, pale granulation within the wound bed. There is a medium (34-66%) amount of necrotic tissue within the wound bed including Adherent Slough. 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 There was a Excisional Skin/Subcutaneous Tissue Debridement  with a total area of 7.5 sq cm performed by Ricard Dillon., MD. With the following instrument(s): Curette to remove Viable and Non-Viable tissue/material. Material removed includes Subcutaneous Tissue and Slough and. No specimens were taken. A time out was conducted at 08:38, prior to the start of the procedure. There was no bleeding. The procedure was tolerated well with a pain level of 0 throughout and a pain level of 0 following the procedure. Post Debridement Measurements: 3cm length x 2.5cm width x 0.3cm depth; 1.767cm^3 volume. Character of Wound/Ulcer Post Debridement is improved. 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 There was a Selective/Open Wound Skin/Epidermis Debridement with a total area of 1.2 sq cm performed by Ricard Dillon., MD. With the following instrument(s): Curette to remove Non-Viable tissue/material. Material removed includes Skin: Epidermis. No specimens were taken. A time out was conducted at 08:38, prior to the start of the procedure. There was no bleeding. The  procedure was tolerated well with a pain level of 0 throughout and a pain level of 0 following the procedure. Post Debridement Measurements: 2cm length x 0.6cm width x 0.2cm depth; 0.188cm^3 volume. Character of Wound/Ulcer Post Debridement is improved. Severity of Tissue Post Debridement is: Fat layer exposed. 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 There was a Three Layer Compression Therapy Procedure by Levan Hurst, RN. Post procedure Diagnosis Wound #37: Same as Pre-Procedure Plan Follow-up Appointments: Return Appointment in 1 week. Dressing Change Frequency: Wound #24 Left,Dorsal Foot: Do not change entire dressing for one week. Wound #35 Left Ischium: Change Dressing every other day. Wound #37 Left,Lateral Malleolus: Do not change entire dressing for one week. Wound #38 Right Toe - Web between 1st and 2nd: Change Dressing every other day. Skin Barriers/Peri-Wound Care: Antifungal cream - on toes on both feet daily Moisturizing lotion - both legs Other: - Triamcinolone cream Wound #24 Left,Dorsal Foot: Barrier cream Moisturizing lotion - to both legs and feet Wound #35 Left Ischium: Antifungal cream - mix with barrier cream Barrier cream Primary Wound Dressing: Wound #24 Left,Dorsal Foot: Cutimed Sorbact - thin layer of hydrogel over Sorbact Wound #35 Left Ischium: Hydrofera Blue Wound #37 Left,Lateral Malleolus: Calcium Alginate with Silver Wound #38 Right Toe - Web between 1st and 2nd: Calcium Alginate with Silver Secondary Dressing: Wound #24 Left,Dorsal Foot: Dry Gauze Heel Cup - add heel cup to left heel for protection. Wound #35 Left Ischium: Foam Border - or ABD pad and tape Wound #37 Left,Lateral Malleolus: Dry Gauze Wound #38 Right Toe - Web between 1st and 2nd: Kerlix/Rolled Gauze - secure with tape Dry Gauze Edema Control: 3 Layer  Compression System - Left Lower Extremity Elevate legs to the level of the heart or above for 30 minutes daily and/or when sitting, a frequency of: - throughout the day Support Garment 30-40 mm/Hg pressure to: - Juxtalite to right leg Off-Loading: Low air-loss mattress (Group 2) Roho cushion for wheelchair Turn and reposition every 2 hours - out of wheelchair throughout the day, try to lay on sides, sleep in the bed not the recliner 1. I continued the Sorbact of the left foot at least for another week 2. Continue silver alginate to the left leg and right foot. 3. Continue Lotrimin to the periwound  on the right foot 4. Still wrapping the left leg 5. He tells me he is up in his wheelchair for 8 hours a day with his feet dependent. I think a lot of this in the left foot has to do with severe uncontrolled venous hypertension. As usual I have asked him to keep his leg elevated. 6. We have not been able to get an MRI of the right foot. The patient will call his Musician) Signed: 12/31/2019 6:01:37 PM By: Linton Ham MD Entered By: Linton Ham on 12/31/2019 09:36:06 -------------------------------------------------------------------------------- SuperBill Details Patient Name: Date of Service: [name] 12/31/2019 Medical Record [id number] Patient Account Number: [id number] Date of Birth/Sex: Treating RN: 1986-09-13 (34 y.o. M) Primary Care Provider: Ina, [place] Other Clinician: Referring Provider: Treating Provider/Extender:Rashawna Scoles, [name], [name]: 208 Diagnosis Coding ICD-10 Codes Code Description L97.521 Non-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 L97.511 Non-pressure chronic ulcer of other part of right foot limited to breakdown of skin 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, complete 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, stage 3 L97.321 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 to  breakdown of skin L03.116 Cellulitis of left lower limb Facility Procedures CPT4 Code Description: [id number] - DEB SUBQ TISSUE 20 SQ CM/< ICD-10 Diagnosis Description L97.521 Non-pressure chronic ulcer of other part of left foot limite Modifier: d to breakdow Quantity: 1 n of skin CPT4 Code Description: [id number] - DEBRIDE WOUND 1ST 20 SQ CM OR < ICD-10 Diagnosis Description L97.321 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 to breakdow Modifier: 59 n of skin Quantity: 1 Physician Procedures CPT4 Code Description: [id number] - WC PHYS SUBQ TISS 20 SQ CM ICD-10 Diagnosis Description L97.521 Non-pressure chronic ulcer of other part of left foot limited Modifier: to breakdow Quantity: 1 n of skin CPT4 Code Description: [id number] - WC PHYS DEBR WO ANESTH 20 SQ CM ICD-10 Diagnosis Description L97.321 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 to breakdown Modifier: 59 of skin Quantity: 1 Electronic Signature(s) Signed: 12/31/2019 6:01:37 PM By: Linton Ham MD Entered By: Linton Ham on 12/31/2019 09:38:04

## 2020-01-07 ENCOUNTER — Encounter (HOSPITAL_BASED_OUTPATIENT_CLINIC_OR_DEPARTMENT_OTHER): Payer: BC Managed Care – PPO | Admitting: Internal Medicine

## 2020-01-07 ENCOUNTER — Other Ambulatory Visit: Payer: Self-pay

## 2020-01-07 DIAGNOSIS — Z9081 Acquired absence of spleen: Secondary | ICD-10-CM | POA: Diagnosis not present

## 2020-01-07 DIAGNOSIS — L97922 Non-pressure chronic ulcer of unspecified part of left lower leg with fat layer exposed: Secondary | ICD-10-CM | POA: Diagnosis not present

## 2020-01-07 DIAGNOSIS — L03115 Cellulitis of right lower limb: Secondary | ICD-10-CM | POA: Diagnosis not present

## 2020-01-07 DIAGNOSIS — I872 Venous insufficiency (chronic) (peripheral): Secondary | ICD-10-CM | POA: Diagnosis not present

## 2020-01-07 DIAGNOSIS — G8221 Paraplegia, complete: Secondary | ICD-10-CM | POA: Diagnosis not present

## 2020-01-07 DIAGNOSIS — L97322 Non-pressure chronic ulcer of left 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: Secondary | ICD-10-CM | POA: Diagnosis not present

## 2020-01-07 DIAGNOSIS — Z882 Allergy status to sulfonamides status: Secondary | ICD-10-CM | POA: Diagnosis not present

## 2020-01-07 DIAGNOSIS — Z8614 Personal history of Methicillin resistant Staphylococcus aureus infection: Secondary | ICD-10-CM | POA: Diagnosis not present

## 2020-01-07 DIAGNOSIS — I10 Essential (primary) hypertension: Secondary | ICD-10-CM | POA: Diagnosis not present

## 2020-01-07 DIAGNOSIS — Z87891 Personal history of nicotine dependence: Secondary | ICD-10-CM | POA: Diagnosis not present

## 2020-01-07 DIAGNOSIS — L97521 Non-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: Secondary | ICD-10-CM | POA: Diagnosis not present

## 2020-01-07 DIAGNOSIS — Z888 Allergy status to other drugs, medicaments and biological substances status: Secondary | ICD-10-CM | POA: Diagnosis not present

## 2020-01-07 DIAGNOSIS — L97511 Non-pressure chronic ulcer of other part of right foot limited to breakdown of skin: Secondary | ICD-10-CM | POA: Diagnosis not present

## 2020-01-07 DIAGNOSIS — I890 Lymphedema, not elsewhere classified: Secondary | ICD-10-CM | POA: Diagnosis not present

## 2020-01-07 DIAGNOSIS — L97522 Non-pressure chronic ulcer of other part of left foot with fat layer exposed: Secondary | ICD-10-CM | POA: Diagnosis not present

## 2020-01-07 DIAGNOSIS — B353 Tinea pedis: Secondary | ICD-10-CM | POA: Diagnosis not present

## 2020-01-07 DIAGNOSIS — L03116 Cellulitis of left lower limb: Secondary | ICD-10-CM | POA: Diagnosis not present

## 2020-01-07 DIAGNOSIS — G4730 Sleep apnea, unspecified: Secondary | ICD-10-CM | POA: Diagnosis not present

## 2020-01-07 DIAGNOSIS — L89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, stage 3: Secondary | ICD-10-CM | POA: Diagnosis not present

## 2020-01-07 DIAGNOSIS — L97321 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 to breakdown of skin: Secondary | ICD-10-CM | POA: Diagnosis not present

## 2020-01-07 NOTE — Progress Notes (Signed)
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 hydrogel to keep the area moist. This is been refractory to multiple dressing attempts. He also has areas in his first and second toes bilaterally in the anterior and posterior web space. I had been using Iodoflex here after a prolonged course of silver alginate with ketoconazole was ineffective [question tinea pedis] 07/14/17; patient arrives today with a very difficult adherent material over his left lateral lower leg wound. He also has surrounding erythema and poorly controlled edema. He was switched his Santyl last visit which the nurses are applying once during his doctor visit and once on a nurse visit. He was also reduced to 2 layer compression I'm not exactly sure of the issue here. 07/21/17; better surface today after 1 week of Iodoflex. Significant cellulitis that we treated last week also better. [Doxycycline] 07/28/17 better surface today with now 2 [name] of Iodoflex. Significant cellulitis treated with doxycycline. He has now completed the doxycycline and he is back to his usual degree of chronic venous inflammation/stasis dermatitis.  He reminds me he has had ablations surgery here 08/04/17; continued improvement with Iodoflex to the left lateral  leg wound in terms of the surface of the wound although the dimensions are better. 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 Problematic areas on the plantar aspect of the first second toe web space on the left and the dorsal aspect of the first second toe web space on the right. At one point I felt these were probably related to chronic fungal infections in treated him aggressively for this although we have not made any improvement here. 08/11/17; left lateral leg. Surface continues to improve with the Iodoflex although we are not seeing much improvement in overall wound dimensions. Areas on his plantar left foot and right foot show no improvement. In fact the right foot looks somewhat worse 08/18/17; left lateral leg. We changed to [facility] Blue last week after a prolonged course of Iodoflex which helps get the surface better. It appears that the wound with is improved. Continue with difficult areas on the left dorsal first second and plantar first second on the right 09/01/17; patient arrives in clinic today having had a temperature of 103 yesterday. He was seen in the ER and [facility]. The patient was concerned he could have cellulitis again in the right leg however they diagnosed him with a UTI and he is now on Keflex. He has a history of cellulitis which is been recurrent and difficult but this is been in the left leg, in the past 5 use doxycycline. He does in and out catheterizations at home which are risk factors for UTI 09/08/17; patient will be completing his Keflex this weekend. The erythema on the left leg is considerably better. He has a new wound today on the medial part of the right leg small superficial almost looks like a skin tear. He has worsening of the area on the right dorsal first and second toe. His major area on the left lateral leg is better. Using Hydrofera Blue on all areas 09/15/17; gradual  reduction in width on the long wound in the left lateral leg. No debridement required. He also has wounds on the plantar aspect of his left first second toe web space and on the dorsal aspect of the right first second toe web space. 09/22/17; there continues to be very gradual improvements in the dimensions of the left lateral leg wound. He hasn't round erythematous spot with might be pressure on his wheelchair. There is no evidence obviously of infection no purulence no warmth He has a dry scaled area on the plantar aspect of the left first second toe Improved area on the dorsal right first second toe. 09/29/17; left lateral leg wound continues to improve in dimensions mostly with an is still a fairly long but increasingly narrow wound. He has a dry scaled area on the plantar aspect of his left first second toe web space Increasingly concerning area on the dorsal right first second toe. In fact I am concerned today about possible cellulitis around this wound. The areas extending up his second toe and although there is deformities here almost appears to abut on the nailbed. 10/06/17; left lateral leg wound continues to make very gradual progress. Tissue culture I did from the right first second toe dorsal foot last time grew MRSA and enterococcus which was vancomycin sensitive. This was not sensitive to clindamycin or doxycycline. He is allergic to Zyvox and sulfa we have therefore  arrange for him to have dalvance infusion tomorrow. He is had this in the past and tolerated it well 10/20/17; left lateral leg wound continues to make decent progress. This is certainly reduced in terms of with there is advancing epithelialization.The cellulitis in the right foot looks better although he still has a deep wound in the dorsal aspect of the first second toe web space. Plantar left first toe web space on the left I think is making some progress 10/27/17; left lateral leg wound continues to make decent  progress. Advancing epithelialization.using Hydrofera Blue The right first second toe web space wound is better-looking using silver alginate Improvement in the left plantar first second toe web space. Again using silver alginate 11/03/17 left lateral leg wound continues to make decent progress albeit slowly. Using [facility] The right per second toe web space continues to be a very problematic looking punched out wound. I obtained a piece of tissue for deep culture I did extensively treated this for fungus. It is difficult to imagine that this is a pressure area as the patient states other than going outside he doesn't really wear shoes at home The left plantar first second toe web space looked fairly senescent. Necrotic edges. This required debridement change to [facility] Blue to all wound areas 11/10/17; left lateral leg wound continues to contract. Using Hydrofera Blue On the right dorsal first second toe web space dorsally. Culture I did of this area last week grew MRSA there is not an easy oral option in this patient was multiple antibiotic allergies or intolerances. This was only a rare culture isolate I'm therefore going to use Bactroban under silver alginate On the left plantar first second toe web space. Debridement is required here. This is also unchanged 11/17/17; left lateral leg wound continues to contract using Hydrofera Blue this is no longer the major issue. The major concern here is the right first second toe web space. He now has an open area going from dorsally to the plantar aspect. There is now wound on the inner lateral part of the first toe. Not a very viable surface on this. There is erythema spreading medially into the forefoot. No major change in the left first second toe plantar wound 11/24/17; left lateral leg wound continues to contract using Hydrofera Blue. Nice improvement today The right first second toe web space all of this looks a lot less angry than last  week. I have given him clindamycin and topical Bactroban for MRSA and terbinafine for the possibility of underlining tinea pedis that I could not control with ketoconazole. Looks somewhat better The area on the plantar left first second toe web space is weeping with dried debris around the wound 12/01/17; left lateral leg wound continues to contract he Hydrofera Blue. It is becoming thinner in terms of with nevertheless it is making good improvement. The right first second toe web space looks less angry but still a large necrotic-looking wounds starting on the plantar aspect of the right foot extending between the toes and now extensively on the base of the right second toe. I gave him clindamycin and topical Bactroban for MRSA anterior benefiting for the possibility of underlying tinea pedis. Not looking better today The area on the left first/second toe looks better. Debrided of necrotic debris 12/05/17* the patient was worked in urgently today because over the weekend he found blood on his incontinence bad when he woke up. He was found to have an ulcer by his wife who does most  of his wound care. He came in today for [place] to look at this. He has not had a history of wounds in his buttocks in spite of his paraplegia. 12/08/17; seen in follow-up today at his usual appointment. He was seen earlier this week and found to have a new wound on his buttock. We also follow him for wounds on the left lateral leg, left first second toe web space and right first second toe web space 12/15/17; we have been using Hydrofera Blue to the left lateral leg which has improved. The right first second toe web space has also improved. Left first second toe web space plantar aspect looks stable. The left buttock has worsened using Santyl. Apparently the buttock has drainage 12/22/17; we have been using Hydrofera Blue to the left lateral leg which continues to improve now 2 small wounds separated by normal skin. He tells  [place] he had a fever up to 100 yesterday he is prone to UTIs but has not noted anything different. He does in and out catheterizations. The area between the first and second toes today does not look good necrotic surface covered with what looks to be purulent drainage and erythema extending into the third toe. I had gotten this to something that I thought look better last time however it is not look good today. He also has a necrotic surface over the buttock wound which is expanded. I thought there might be infection under here so I removed a lot of the surface with a #5 curet though nothing look like it really needed culturing. He is been using Santyl to this area 12/27/17; his original wound on the left lateral leg continues to improve using Hydrofera Blue. I gave him samples of Baxdella although he was unable to take them out of fear for an allergic reaction ["lump in his throat"].the culture I did of the purulent drainage from his second toe last week showed both enterococcus and a set Enterobacter I was also concerned about the erythema on the bottom of his foot although paradoxically although this looks somewhat better today. 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 We've been using silver alginate here. They came up today that he sleeps in a recliner, I'm not sure why but I asked him to stop this 01/03/18; his original wound we've been using Hydrofera Blue is now separated into 2 areas. Ulcer on his left buttock is better he is off the recliner and sleeping in bed Finally both wound areas between his first and second toes also looks some better 01/10/18; his original wound on the left lateral leg is now separated into 2 wounds we've been using Hydrofera Blue Ulcer on his left buttock has some drainage. There is a small probing site going into muscle layer superiorly.using silver alginate -He arrives  today with a deep tissue injury on the left heel The wound on the dorsal aspect of his first second toe on the left looks a lot betterusing silver alginate ketoconazole The area on the first second toe web space on the right also looks a lot bette 01/17/18; his original wound on the left lateral leg continues to progress using Hydrofera Blue Ulcer on his left buttock also is smaller surface healthier except for a small probing site going into the muscle layer superiorly. 2.4 cm of tunneling in this area DTI on his left heel we have only been offloading. Looks better  than last week no threatened open no evidence of infection the wound on the dorsal aspect of the first second toe on the left continues to look like it's regressing we have only been using silver alginate and terbinafine orally The area in the first second toe web space on the right also looks to be a lot better using silver alginate and terbinafine I think this was prompted by tinea pedis 01/31/18; the patient was hospitalized in [place] last week apparently for a complicated UTI. He was discharged on cefepime he does in and out catheterizations. In the hospital he was discovered M I don't mild elevation of AST and ALTs and the terbinafine was stopped.predictably the pressure ulcer on his buttock looks betterusing silver alginate. The area on the left lateral leg also is better using Hydrofera Blue. The area between the first and second toes on the left better. First and second toes on the right still substantial but better. Finally the DTI on the left heel has held together and looks like it's resolving 02/07/18-he is here in follow-up evaluation for multiple ulcerations. He has new injury to the lateral aspect of the last issue a pressure ulcer, he states this is from adhesive removal trauma. He states he has tried multiple adhesive products with no success. All other ulcers appear stable. The left heel DTI is resolving. We will  continue with same treatment plan and follow-up next week. 02/14/18; follow-up for multiple areas. He has a new area last week on the lateral aspect of his pressure ulcer more over the posterior trochanter. The original pressure ulcer looks quite stable has healthy granulation. 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 Almost fully epithelialized on the original superior area using Hydrofera Blue DTI on the left heel has peeled off this week to reveal a small superficial wound under denuded skin and subcutaneous tissue Both areas between the first and second toes look better including nothing open on the left 02/21/18; The patient's wounds on his left ischial tuberosity and posterior left greater trochanter actually looked better. He has a large area of irritation around the area which I think is contact dermatitis. I am doubtful that this is fungal His original wound on the left lateral calf continues to improve we have been using Hydrofera Blue There is no open area in the left first second toe web space although there is a lot of thick callus The DTI on the left heel required debridement today of necrotic surface eschar and subcutaneous tissue using silver alginate Finally the area on the right first second toe webspace continues to contract using silver alginate and ketoconazole 02/28/18 Left ischial tuberosity wounds look better using silver alginate. Original wound on the left calf only has one small open area left using Hydrofera Blue DTI on the left heel required debridement mostly removing skin from around this wound surface. Using silver alginate The areas on the right first/second toe web space using silver alginate and ketoconazole 03/08/18 on evaluation today patient appears to be doing decently well as best I can tell in regard to his wounds. This is the first time that I have  seen him as he generally is followed by Dr. Dellia Nims. With that being said none of his wounds appear to be infected he does have an area where there is some skin covering what appears to be a new wound on the left dorsal surface of  his great toe. This is right at the nail bed. With that being said I do believe that debrided away some of the excess skin can be of benefit in this regard. Otherwise he has been tolerating the dressing changes without complication. 03/14/18; patient arrives today with the multiplicity of wounds that we are following. He has not been systemically unwell Original wound on the left lateral calf now only has 2 small open areas we've been using Hydrofera Blue which should continue The deep tissue injury on the left heel requires debridement today. We've been using silver alginate The left first second toe and the right first second toe are both are reminiscence what I think was tinea pedis. Apparently some of the callus Surface between the toes was removed last week when it started draining. Purulent drainage coming from the wound on the ischial tuberosity on the left. 03/21/18-He is here in follow-up evaluation for multiple wounds. 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 He tolerated debridement. The only change to last week's recommendations is to discontinue antifungal cream between toes. He will follow-up next week 03/28/18; following up for multiple wounds;Concern this week is streaking redness and swelling in the right foot. He is going to need antibiotics for this. 03/31/18; follow-up for right foot cellulitis. Streaking redness and swelling in the right foot on 03/28/18. 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 I put him on clindamycin 300 mg every 6 and brought him in for a quick check. He has an open wound between his first and second toes on the right foot as a potential  source. 04/04/18; Right foot cellulitis is resolving he is completing clindamycin. This is truly good news Left lateral calf wound which is initial wound only has one small open area inferiorly this is close to healing out. He has compression stockings. We will use Hydrofera Blue right down to the epithelialization of this Nonviable surface on the left heel which was initially pressure with a DTI. We've been using Hydrofera Blue. I'm going to switch this back to silver alginate Left first second toe/tinea pedis this looks better using silver alginate Right first second toe tinea pedis using silver alginate Large pressure ulcers on theLeft ischial tuberosity. Small wound here Looks better. I am uncertain about the surface over the large wound. Using silver alginate 04/11/18; Cellulitis in the right foot is resolved Left lateral calf wound which was his original wounds still has 2 tiny open areas remaining this is just about closed Nonviable surface on the left heel is better but still requires debridement Left first second toe/tinea pedis still open using silver alginate Right first second toe wound tinea pedis I asked him to go back to using ketoconazole and silver alginate Large pressure ulcers on the left ischial tuberosity this shear injury here is resolved. Wound is smaller. No evidence of infection using silver alginate 04/18/18; Patient arrives with an intense area of cellulitis in the right mid lower calf extending into the right heel area. Bright red and warm. Smaller area on the left anterior leg. He has a significant history of MRSA. He will definitely need antibioticsdoxycycline He now has 2 open areas on the left ischial tuberosity the original large wound and now a satellite area which I think was above his initial satellite areas. Not a wonderful surface on this satellite area surrounding erythema which looks like pressure related. His left lateral calf wound again his original  wound is just about closed Left heel pressure injury still requiring debridement Left first second toe looks a lot better using silver alginate Right first second toe also using silver alginate and ketoconazole cream also looks better 04/20/18; the patient was worked in early today out of concerns with his cellulitis on the right leg. I had started him on doxycycline. This was 2 days ago. His wife was concerned about the swelling in the area. Also concerned about the left buttock. He has not been systemically unwell no fever chills. No nausea vomiting or diarrhea 04/25/18; the patient's left buttock wound is continued to deteriorate he is using Hydrofera Blue. He is still completing clindamycin for the cellulitis on the right leg although all of this looks better. 05/02/18 Left buttock wound still with a lot of drainage and a very tightly adherent fibrinous necrotic surface. He has a deeper area superiorly The left lateral calf wound is still closed DTI wound on the left heel necrotic surface especially the circumference using Iodoflex Areas between his left first second toe and right first second toe both look better. Dorsally and the right first second toe he had a necrotic surface although at smaller. In using silver alginate and ketoconazole. I did a culture last week which was a deep tissue culture of the reminiscence of the open wound on the right first second toe dorsally. This grew a few Acinetobacter and a few methicillin-resistant staph aureus. Nevertheless the area actually this week looked better. I didn't feel the need to specifically address this at least in terms of systemic antibiotics. 05/09/18; wounds are measuring larger more drainage per our intake. We are using Santyl covered with alginate on the large superficial buttock wounds, Iodosorb on the left heel, ketoconazole and silver alginate to the dorsal first and second toes bilaterally. 05/16/18; The area on his left buttock  better in some aspects although the area superiorly over the ischial tuberosity required an extensive debridement.using Santyl Left heel appears stable. Using Iodoflex The areas between his first and second toes are not bad however there is spreading erythema up the dorsal aspect of his left foot this looks like cellulitis again. He is insensate the erythema is really very brilliant.o Erysipelas He went to [name] an allergist days ago because he was itching part of this he had lab work done. This showed a white count of 15.1 with 70% neutrophils. Hemoglobin of 11.4 and a platelet count of 659,000. Last white count we had in Epic was a 2-1/2 years ago which was 25.9 but he was ill at the time. He was able to show me some lab work that was done by his primary physician the pattern is about the same. 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 But prompted me to go ahead and do x-rays of both feet and the pelvis rule out osteomyelitis. He also had a comprehensive metabolic panel this was reasonably normal his albumin was 3.7 liver function tests BUN/creatinine all normal 05/23/18; x-rays of both his feet from last week were negative for underlying pulmonary abnormality. The x-ray of his pelvis however showed mild irregularity in the left ischial which may represent some early osteomyelitis. The wound in the left ischial continues to get deeper clearly now exposed muscle. Each week necrotic surface material over this area. Whereas the rest of the wounds do not look so bad. The left ischial wound we have been using Santyl and calcium alginate To the  left heel surface necrotic debris using Iodoflex The left lateral leg is still healed Areas on the left dorsal foot and the right dorsal foot are about the same. There is some inflammation on the left which might represent contact dermatitis, fungal dermatitis I am doubtful cellulitis although this looks better  than last week 05/30/18; CT scan done at Hospital did not show any osteomyelitis or abscess. Suggested the possibility of underlying cellulitis although I don't [name] a lot of evidence of this at the bedside The wound itself on the left buttock/upper thigh actually looks somewhat better. No debridement Left heel also looks better no debridement continue Iodoflex Both dorsal first second toe spaces appear better using Lotrisone. Left still required debridement 06/06/18; Intake reported some purulent looking drainage from the left gluteal wound. Using Santyl and calcium alginate Left heel looks better although still a nonviable surface requiring debridement The left dorsal foot first/second webspace actually expanding and somewhat deeper. I may consider doing a shave biopsy of this area Right dorsal foot first/second webspace appears stable to improved. Using Lotrisone and silver alginate to both these areas 06/13/18 Left gluteal surface looks better. Now separated in the 2 wounds. No debridement required. Still drainage. We'll continue silver alginate Left heel continues to look better with Iodoflex continue this for at least another week Of his dorsal foot wounds the area on the left still has some depth although it looks better than last week. We've been using Lotrisone and silver alginate 06/20/18 Left gluteal continues to look better healthy tissue Left heel continues to look better healthy granulation wound is smaller. He is using Iodoflex and his long as this continues continue the Iodoflex Dorsal right foot looks better unfortunately dorsal left foot does not. There is swelling and erythema of his forefoot. He had minor trauma to this several days ago but doesn't think this was enough to have caused any tissue injury. Foot looks like cellulitis, we have had this problem before 06/27/18 on evaluation today patient appears to be doing a little worse in regard to his foot ulcer. 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 Both of which would really be his only options for treating this infection. In the past he has been given and effusion of Orbactiv. This is done very well for him in the past again it's one time dosing IV antibiotic therapy. Subsequently I do believe this is something we're gonna need to [name] about doing at this point in time. Currently his other wounds seem to be doing somewhat better in my pinion I'm pretty happy in that regard. 07/03/18 on evaluation today patient's wounds actually appear to be doing fairly well. He has been tolerating the dressing changes without complication. All in all he seems to be showing signs of improvement. In regard to the antibiotics he has been dealing with infectious disease since I saw him last week as far as getting this scheduled. In the end [facility]'s [facility] this coming Friday. In the meantime he has been continuing to perform the dressing changes in such as previous. There does not appear to be any evidence of infection worsengin at this time. 07/10/18; Since I last saw this man 2 [name] ago things have actually improved. IV antibiotics of resulted in less forefoot erythema although there  is still some present. He is not systemically unwell Left buttock wounds 2 now have no depth there is increased epithelialization Using silver alginate Left heel still requires debridement using Iodoflex Left dorsal foot still with a sizable wound about the size of a border but healthy granulation Right dorsal foot still with a slitlike area using silver alginate 07/18/18; the patient's cellulitis in the left foot is improved in fact I think it is on its way to resolving. Left buttock wounds 2 both look better although the larger one has hypertension  granulation we've been using silver alginate Left heel has some thick circumferential redundant skin over the wound edge which will need to be removed today we've been using Iodoflex Left dorsal foot is still a sizable wound required debridement using silver alginate The right dorsal foot is just about closed only a small open area remains here 07/25/18; left foot cellulitis is resolved Left buttock wounds 2 both look better. Hyper-granulation on the major area Left heel as some debris over the surface but otherwise looks a healthier wound. Using silver collagen Right dorsal foot is just about closed 07/31/18; arrives with our intake nurse worried about purulent drainage from the buttock. We had hyper-granulation here last week His buttock wounds 2 continue to look better Left heel some debris over the surface but measuring smaller. Right dorsal foot unfortunately has openings between the toes Left foot superficial wound looks less aggravated. 08/07/18 Buttock wounds continue to look better although some of her granulation and the larger medial wound. silver alginate Left heel continues to look a lot better.silver collagen Left foot superficial wound looks less stable. Requires debridement. He has a new wound superficial area on the foot on the lateral dorsal foot. Right foot looks better using silver alginate without Lotrisone 08/14/2018; patient was in the ER last week diagnosed with a UTI. He is now on Cefpodoxime and Macrodantin. Buttock wounds continued to be smaller. Using silver alginate Left heel continues to look better using silver collagen Left foot superficial wound looks as though it is improving Right dorsal foot area is just about healed. 08/21/2018; patient is completed his antibiotics for his UTI. He has 2 open areas on the buttocks. There is still not closed although the surface looks satisfactory. Using silver alginate Left heel continues to improve using silver  collagen The bilateral dorsal foot areas which are at the base of his first and second toes/possible tinea pedis are actually stable on the left but worse on the right. The area on the left required debridement of necrotic surface. After debridement I obtained a specimen for PCR culture. The right dorsal foot which is been just about healed last week is now reopened 08/28/2018; culture done on the left dorsal foot showed coag negative staph both staph epidermidis and Lugdunensis. I think this is worthwhile initiating systemic treatment. I will use doxycycline given his long list of allergies. The area on the left heel slightly improved but still requiring debridement. The large wound on the buttock is just about closed whereas the smaller one is larger. Using silver alginate in this area 09/04/2018; patient is completing his doxycycline for the left foot although this continues to be a very difficult wound area with very adherent necrotic debris. We are using silver alginate to all his wounds right foot left foot and the small wounds on his buttock, silver collagen on the left heel. 09/11/2018; once again this patient has intense erythema and swelling of the left forefoot. Lesser degrees  of erythema in the right foot. He has a long list of allergies and intolerances. I will reinstitute doxycycline. 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 Using silver alginate Left heel also looks better using silver collagen Unfortunately both the areas on his feet look worse. The area on the left first second webspace is now gone through to the plantar part of his foot. The area on the left foot anteriorly is irritated with erythema and swelling in the forefoot. 09/25/2018 His wound on the left plantar heel looks better. Using silver collagen The area on the left buttock 2 small remnant areas. One is closed one is still open. Using silver alginate The areas  between both his first and second toes look worse. This in spite of long-standing antifungal therapy with ketoconazole and silver alginate which should have antifungal activity He has small areas around his original wound on the left calf one is on the bottom of the original scar tissue and one superiorly both of these are small and superficial but again given wound history in this site this is worrisome 10/02/2018 Left plantar heel continues to gradually contract using silver collagen Left buttock wound is unchanged using silver alginate The areas on his dorsal feet between his first and second toes bilaterally look about the same. I prescribed clindamycin ointment to [name] if we can address chronic staph colonization and also the underlying possibility of erythrasma The left lateral lower extremity wound is actually on the lateral part of his ankle. Small open area here. We have been using silver alginate 10/09/2018; Left plantar heel continues to look healthy and contract. No debridement is required Left buttock slightly smaller with a tape injury wound just below which was new this week Dorsal feet somewhat improved I have been using clindamycin Left lateral looks lower extremity the actual open area looks worse although a lot of this is epithelialized. I am going to change to silver collagen today He has a lot more swelling in the right leg although this is not pitting not red and not particularly warm there is a lot of spasm in the right leg usually indicative of people with paralysis of some underlying discomfort. We have reviewed his vascular status from 2017 he had a left greater saphenous vein ablation. I wonder about referring him back to vascular surgery if the area on the left leg continues to deteriorate. 10/16/2018 in today for follow-up and management of multiple lower extremity ulcers. His left Buttock wound is much lower smaller and almost closed completely. The wound to the  left ankle has began to reopen with Epithelialization and some adherent slough. He has multiple new areas to the left foot and leg. The left dorsal foot without much improvement. Wound present between left great webspace and 2nd toe. Erythema and edema present right leg. Right LE ultrasound obtained on 10/10/18 was negative for DVT. 10/23/2018; Left buttock is closed over. Still dry macerated skin but there is no open wound. I suspect this is chronic pressure/moisture Left lateral calf is quite a bit worse than when I saw this last. There is clearly drainage here he has macerated skin into the left plantar heel. We will change the primary dressing to alginate Left dorsal foot has some improvement in overall wound area. Still using clindamycin and silver alginate Right dorsal foot about the same as the left using clindamycin and silver alginate The erythema in the right leg has resolved.  He is DVT rule out was negative Left heel pressure area required debridement although the wound is smaller and the surface is health 10/26/2018 The patient came back in for his nurse check today predominantly because of the drainage coming out of the left lateral leg with a recent reopening of his original wound on the left lateral calf. He comes in today with a large amount of surrounding erythema around the wound extending from the calf into the ankle and even in the area on the dorsal foot. He is not systemically unwell. He is not febrile. Nevertheless this looks like cellulitis. We have been using silver alginate to the area. I changed him to a regular visit and I am going to prescribe him doxycycline. The rationale here is a long list of medication intolerances and a history of MRSA. I did not [name] anything that I thought would provide a valuable culture 10/30/2018 Follow-up from his appointment 4 days ago with really an extensive area of cellulitis in the left calf left lateral ankle and left dorsal foot. I  put him on doxycycline. He has a long list of medication allergies which are true allergy reactions. 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 In any case he is a lot better today. The erythema especially in the anterior and lateral left calf is better. He still has left ankle erythema. He also is complaining about increasing edema in the right leg we have only been using Kerlix Coban and he has been doing the wraps at home. Finally he has a spotty rash on the medial part of his upper left calf which looks like folliculitis or perhaps wrap occlusion type injury. Small superficial macules not pustules 11/06/18 patient arrives today with again a considerable degree of erythema around the wound on the left lateral calf extending into the dorsal ankle and dorsal foot. This is a lot worse than when I saw this last week. He is on doxycycline really with not a lot of improvement. He has not been systemically unwell Wounds on the; left heel actually looks improved. Original area on the left foot and proximity to the first and second toes looks about the same. He has superficial areas on the dorsal foot, anterior calf and then the reopening of his original wound on the left lateral calf which looks about the same The only area he has on the right is the dorsal webspace first and second which is smaller. He has a large area of dry erythematous skin on the left buttock small open area here. 11/13/2018; the patient arrives in much better condition. The erythema around the wound on the left lateral calf is a lot better. Not sure whether this was the clindamycin or the TCA and ketoconazole or just in the improvement in edema control [stasis dermatitis]. In any case this is a lot better. The area on the left heel is very small and just about resolved using silver collagen we have been using silver alginate to the areas on his dorsal  feet 11/20/2018; his wounds include the left lateral calf, left heel, dorsal aspects of both feet just proximal to the first second webspace. He is stable to slightly improved. I did not think any changes to his dressings were going to be necessary 11/27/2018 he has a reopening on the left buttock which is surrounded by what looks like tinea or perhaps some other form of dermatitis. The area on the left  dorsal foot has some erythema around it I have marked this area but I am not sure whether this is cellulitis or not. Left heel is not closed. Left calf the reopening is really slightly longer and probably worse 1/13; in general things look better and smaller except for the left dorsal foot. Area on the left heel is just about closed, left buttock looks better only a small wound remains in the skin looks better [using Lotrisone] 1/20; the area on the left heel only has a few remaining open areas here. Left lateral calf about the same in terms of size, left dorsal foot slightly larger right lateral foot still not closed. The area on the left buttock has no open wound and the surrounding skin looks a lot better 1/27; the area on the left heel is closed. Left lateral calf better but still requiring extensive debridements. The area on his left buttock is closed. He still has the open areas on the left dorsal foot which is slightly smaller in the right foot which is slightly expanded. We have been using Iodoflex on these areas as well 2/3; left heel is closed. Left lateral calf still requiring debridement using Iodoflex there is no open area on his left buttock however he has dry scaly skin over a large area of this. Not really responding well to the Lotrisone. Finally the areas on his dorsal feet at the level of the first second webspace are slightly smaller on the right and about the same on the left. Both of these vigorously debrided with Anasept and gauze 2/10; left heel remains closed he has dry  erythematous skin over the left buttock but there is no open wound here. Left lateral leg has come in and with. Still requiring debridement we have been using Iodoflex here. Finally the area on the left dorsal foot and right dorsal foot are really about the same extremely dry callused fissured areas. He does not yet have a dermatology appointment 2/17; left heel remains closed. He has a new open area on the left buttock. The area on the left lateral calf is bigger longer and still covered in necrotic debris. No major change in his foot areas bilaterally. I am awaiting for a dermatologist to look on this. We have been using ketoconazole I do not know that this is been doing any good at all. 2/24; left heel remains closed. The left buttock wound that was new reopening last week looks better. The left lateral calf appears better also although still requires debridement. The major area on his foot is the left first second also requiring debridement. We have been putting Prisma on all wounds. I do not believe that the ketoconazole has done too much good for his feet. He will use Lotrisone I am going to give him a 2-week course of terbinafine. We still do not have a dermatology appointment 3/2 left heel remains closed however there is skin over bone in this area I pointed this out to him today. The left buttock wound is epithelialized but still does not look completely stable. The area on the left leg required debridement were using silver collagen here. With regards to his feet we changed to Lotrisone last week and silver alginate. 3/9; left heel remains closed. Left buttock remains closed. The area on the right foot is essentially closed. The left foot remains unchanged. Slightly smaller on the left lateral calf. Using silver collagen to both of these areas 3/16-Left heel remains closed. Area on right foot  is closed. Left lateral calf above the lateral malleolus open wound requiring debridement with  easy bleeding. Left dorsal wound proximal to first toe also debrided. Left ischial area open new. Patient has been using Prisma with wrapping every 3 days. Dermatology appointment is apparently tomorrow.Patient has completed his terbinafine 2-week course with some apparent improvement according to him, there is still flaking and dry skin in his foot on the left 3/23; area on the right foot is reopened. The area on the left anterior foot is about the same still a very necrotic adherent surface. He still has the area on the left leg and reopening is on the left buttock. He apparently saw dermatology although I do not have a note. According to the patient who is usually fairly well informed they did not have any good ideas. Put him on oral terbinafine which she is been on before. 3/30; using silver collagen to all wounds. Apparently his dermatologist put him on doxycycline and rifampin presumably some culture grew staph. I do not have this result. He remains on terbinafine although I have used terbinafine on him before 4/6; patient has had a fairly substantial reopening on the right foot between the first and second toes. He is finished his terbinafine and I believe is on doxycycline and rifampin still as prescribed by dermatology. We have been using silver collagen to all his wounds although the patient reports that he thinks silver alginate does better on the wounds on his buttock. 4/13; the area on his left lateral calf about the same size but it did not require debridement. Left dorsal foot just proximal to the webspace between the first and second toes is about the same. Still nonviable surface. I note some superficial bronze discoloration of the dorsal part of his foot Right dorsal foot just proximal to the first and second toes also looks about the same. I still think there may be the same discoloration I noted above on the left Left buttock wound looks about the same 4/20; left lateral calf  appears to be gradually contracting using silver collagen. 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 The left dorsal foot wound is debrided of tightly adherent necrotic debris and really cleans up quite nicely. The right area is worse with expansion. I did not debride this it is now over the base of the second toe The area on his left buttock is smaller no debridement is required using silver collagen 5/4; left calf continues to make good progress. He arrives with erythema around the wounds on his dorsal foot which even extends to the plantar aspect. Very concerning for coexistent infection. He is finished the erythromycin I gave him for possible erythrasma this does not seem to have helped. The area on the left foot is about the same base of the dorsal toes Is area on the buttock looks improved on the left 5/11; left calf and left buttock continued to make good progress. Left foot is about the same to slightly improved. Major problem is on the right foot. He has not had an x-ray. Deep tissue culture I did last week showed both Enterobacter and E. coli. I did not change the doxycycline I put him on empirically although neither 1 of these were plated to doxycycline. He arrives today with the erythema looking worse on both the dorsal and plantar foot. Macerated skin on the bottom of the foot. he has not been systemically unwell 5/18-Patient returns at 1 week, left  calf wound appears to be making some progress, left buttock wound appears slightly worse than last time, left foot wound looks slightly better, right foot redness is marginally better. X-ray of both feet show no air or evidence of osteomyelitis. Patient is finished his Omnicef and terbinafine. He continues to have macerated skin on the bottom of the left foot as well as right 5/26; left calf wound is better, left buttock wound appears to have multiple small superficial open areas  with surrounding macerated skin. X-rays that I did last time showed no evidence of osteomyelitis in either foot. He is finished cefdinir and doxycycline. I do not think that he was on terbinafine. He continues to have a large superficial open area on the right foot anterior dorsal and slightly between the first and second toes. I did send him to dermatology 2 months ago or so wondering about whether they would do a fungal scraping. I do not believe they did but did do a culture. We have been using silver alginate to the toe areas, he has been using antifungals at home topically either ketoconazole or Lotrisone. We are using silver collagen on the left foot, silver alginate on the right, silver collagen on the left lateral leg and silver alginate on the left buttock 6/1; left buttock area is healed. We have the left dorsal foot, left lateral leg and right dorsal foot. We are using silver alginate to the areas on both feet and silver collagen to the area on his left lateral calf 6/8; the left buttock apparently reopened late last week. He is not really sure how this happened. He is tolerating the terbinafine. Using silver alginate to all wounds 6/15; left buttock wound is larger than last week but still superficial. Came in the clinic today with a report of purulence from the left lateral leg I did not identify any infection Both areas on his dorsal feet appear to be better. He is tolerating the terbinafine. Using silver alginate to all wounds 6/22; left buttock is about the same this week, left calf quite a bit better. His left foot is about the same however he comes in with erythema and warmth in the right forefoot once again. Culture that I gave him in the beginning of May showed Enterobacter and E. coli. I gave him doxycycline and things seem to improve although neither 1 of these organisms was specifically plated. 6/29; left buttock is larger and dry this week. Left lateral calf looks to me to  be improved. Left dorsal foot also somewhat improved right foot completely unchanged. The erythema on the right foot is still present. He is completing the Ceftin dinner that I gave him empirically [[name] discussion above.) 7/6 - All wounds look to be stable and perhaps improved, the left buttock wound is slightly smaller, per patient bleeds easily, completed ceftin, the right foot redness is less, he is on terbinafine 7/13; left buttock wound about the same perhaps slightly narrower. Area on the left lateral leg continues to narrow. Left dorsal foot slightly smaller right foot about the same. We are using silver alginate on the right foot and Hydrofera Blue to the areas on the left. Unna boot on the left 2 layer compression on the right 7/20; left buttock wound absolutely the same. Area on lateral leg continues to get better. Left dorsal foot require debridement as did the right no major change in the 7/27; left buttock wound the same size necrotic debris over the surface. The area on the  lateral leg is closed once again. His left foot looks better right foot about the same although there is some involvement now of the posterior first second toe area. He is still on terbinafine which I have given him for a month, not certain a centimeter major change 06/25/19-All wounds appear to be slightly improved according to report, left buttock wound looks clean, both foot wounds have minimal to no debris the right dorsal foot has minimal slough. We are using Hydrofera Blue to the left and silver alginate to the right foot and ischial wound. 8/10-Wounds all appear to be around the same, the right forefoot distal part has some redness which was not there before, however the wound looks clean and small. Ischial wound looks about the same with no changes 8/17; his wound on the left lateral calf which was his original chronic venous insufficiency wound remains closed. Since I last saw him the areas on the left  dorsal foot right dorsal foot generally appear better but require debridement. The area on his left initial tuberosity appears somewhat larger to me perhaps hyper granulated and bleeds very easily. We have been using Hydrofera Blue to the left dorsal foot and silver alginate to everything else 8/24; left lateral calf remains closed. The areas on his dorsal feet on the webspace of the first and second toes bilaterally both look better. The area on the left buttock which is the pressure ulcer stage II slightly smaller. I change the dressing to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 improved 08/16/16; the area on the last lateral leg did not change any of dimensions. Post debridement with a curet the area looked better. Left foot wound improved and the area on the dorsal right second toe is improved 08/23/16; the area on the left lateral leg may be slightly smaller both in terms of length and width. Aggressive debridement with a curette afterwards the tissue appears healthier. Left foot wound appears improved in the area on the dorsal right second toe is improved 08/30/16 patient developed a  fever over the weekend and was seen in an urgent care. Felt to have a UTI and put on doxycycline. He has been since changed over the phone to [facility]. After we took off the wrap on his right leg today the leg is swollen warm and erythematous, probably more likely the source of the fever 09/06/16; have been using collagen to the major left leg wound, silver alginate to the area on his anterior foot/toes 09/13/16; the areas on his anterior foot/toes on both sides appear to be virtually closed. Extensive wound on the left lateral leg perhaps slightly narrower but each visit still covered an adherent surface slough 09/16/16 patient was in for his usual Thursday nurse visit however the intake nurse noted significant erythema of his dorsal right foot. He is also running a low-grade fever and having increasing spasms in the right leg 09/20/16 here for cellulitis involving his right great toes and forefoot. This is a lot better. Still requiring debridement on his left lateral leg. Santyl direct says he needs prior authorization. Therefore his wife cannot change this at home 09/30/16; the patient's extensive area on the left lateral calf and ankle perhaps somewhat better. Using Santyl. The area on the left toes is healed and I think the area on his right dorsal foot is healed as well. There is no cellulitis or venous inflammation involving the right leg. He is going to need compression stockings here. 10/07/16; the patient's extensive wound on the left lateral calf and ankle does not measure any differently however there appears to be less adherent surface slough using Santyl and aggressive weekly debridements 10/21/16; no major change in the area on the left lateral calf. Still the same measurement still very difficult to debridement adherent slough and nonviable subcutaneous tissue. This is not really been helped by several [name] of Santyl. Previously for 2 [name] I used Iodoflex for a short period. A  prolonged course of Hydrofera Blue didn't really help. I'm not sure why I only used 2 [name] of Iodoflex on this there is no evidence of surrounding infection. He has a small area on the right second toe which looks as though it's progressing towards closure 10/28/16; the wounds on his toes appear to be closed. No major change in the left lateral leg wound although the surface looks somewhat better using Iodoflex. He has had previous arterial studies that were normal. He has had reflux studies and is status post ablation although I don't have any exact notes on which vein was ablated. I'll need to check the surgical record 11/04/16; he's had a reopening between the first and second toe on the left and right. No major change in the left lateral leg wound. There is what appears to be cellulitis of the left dorsal foot 11/18/16 the patient was hospitalized initially in [place] and then subsequently transferred to [place] [facility] and  was admitted there from 11/09/16 through 11/12/16. He had developed progressive cellulitis on the right leg in spite of the doxycycline I gave him. I'd spoken to the hospitalist in [place] who was concerned about continuing leukocytosis. CT scan is what I suggested this was done which showed soft tissue swelling without evidence of osteomyelitis or an underlying abscess blood cultures were negative. At [facility] he was treated with vancomycin and Primaxin and then add an infectious disease consult. He was transitioned to Ceftaroline. He has been making progressive improvement. Overall a severe cellulitis of the right leg. He is been using silver alginate to her original wound on the left leg. The wounds in his toes on the right are closed there is a small open area on the base of the left second toe 11/26/15; the patient's right leg is much better although there is still some edema here this could be reminiscent from his severe cellulitis likely on top of some degree of  lymphedema. His left anterior leg wound has less surface slough as reported by her intake nurse. Small wound at the base of the left second toe 12/02/16; patient's right leg is better and there is no open wound here. His left anterior lateral leg wound continues to have a healthy-looking surface. Small wound at the base of the left second toe however there is erythema in the left forefoot which is worrisome 12/16/16; is no open wounds on his right leg. We took measurements for stockings. His left anterior lateral leg wound continues to have a healthy-looking surface. I'm not sure where we were with the Apligraf run through his insurance. We have been using Iodoflex. He has a thick eschar on the left first second toe interface, I suspect this may be fungal however there is no visible open 12/23/16; no open wound on his right leg. He has 2 small areas left of the linear wound that was remaining last week. We have been using Prisma, I thought I have disclosed this week, we can only look forward to next week 01/03/17; the patient had concerning areas of erythema last week, already on doxycycline for UTI through his primary doctor. The erythema is absolutely no better there is warmth and swelling both medially from the left lateral leg wound and also the dorsal left foot. 01/06/17- Patient is here for follow-up evaluation of his left lateral leg ulcer and bilateral feet ulcers. He is on oral antibiotic therapy, tolerating that. Nursing staff and the patient states that the erythema is improved from Monday. 01/13/17; the predominant left lateral leg wound continues to be problematic. I had put Apligraf on him earlier this month once. However he subsequently developed what appeared to be an intense cellulitis around the left lateral leg wound. I gave him Dalvance I think on 2/12 perhaps 2/13 he continues on cefdinir. The erythema is still present but the warmth and swelling is improved. I am hopeful that the  cellulitis part of this control. I wouldn't be surprised if there is an element of venous inflammation as well. 01/17/17. The erythema is present but better in the left leg. His left lateral leg wound still does not have a viable surface buttons certain parts of this long thin wound it appears like there has been improvement in dimensions. 01/20/17; the erythema still present but much better in the left leg. I'm thinking this is his usual degree of chronic venous inflammation. The wound on the left leg looks somewhat better. Is less surface slough 01/27/17; erythema  is back to the chronic venous inflammation. The wound on the left leg is somewhat better. I am back to the point where I like to try an Apligraf once again 02/10/17; slight improvement in wound dimensions. Apligraf #2. He is completing his doxycycline 02/14/17; patient arrives today having completed doxycycline last Thursday. This was supposed to be a nurse visit however once again he hasn't tense erythema from the medial part of his wound extending over the lower leg. Also erythema in his foot this is roughly in the same distribution as last time. He has baseline chronic venous inflammation however this is a lot worse than the baseline I have learned to accept the on him is baseline inflammation 02/24/17- patient is here for follow-up evaluation. He is tolerating compression therapy. His voicing no complaints or concerns he is here anticipating an Apligraf 03/03/17; he arrives today with an adherent necrotic surface. I don't think this is surface is going to be amenable for Apligraf's. The erythema around his wound and on the left dorsal foot has resolved he is off antibiotics 03/10/17; better-looking surface today. I don't think he can tolerate Apligraf's. He tells me he had a wound VAC after a skin graft years ago to this area and they had difficulty with a seal. The erythema continues to be stable around this some degree of chronic venous  inflammation but he also has recurrent cellulitis. We have been using Iodoflex 03/17/17; continued improvement in the surface and may be small changes in dimensions. Using Iodoflex which seems the only thing that will control his surface 03/24/17- He is here for follow up evaluation of his LLE lateral ulceration and ulcer to right dorsal foot/toe space. He is voicing no complaints or concerns, He is tolerating compression wrap. 03/31/17 arrives today with a much healthier looking wound on the left lower extremity. We have been using Iodoflex for a prolonged period of time which has for the first time prepared and adequate looking wound bed although we have not had much in the way of wound dimension improvement. He also has a small wound between the first and second toe on the right 04/07/17; arrives today with a healthy-looking wound bed and at least the top 50% of this wound appears to be now her. No debridement was required I have changed him to [facility] last week after prolonged Iodoflex. He did not do well with Apligraf's. We've had a re-opening between the first and second toe on the right 04/14/17; arrives today with a healthier looking wound bed contractions and the top 50% of this wound and some on the lesser 50%. Wound bed appears healthy. The area between the first and second toe on the right still remains problematic 04/21/17; continued very gradual improvement. Using [facility] 04/28/17; continued very gradual improvement in the left lateral leg venous insufficiency wound. His periwound erythema is very mild. We have been using Hydrofera Blue. Wound is making progress especially in the superior 50% 05/05/17; he continues to have very gradual improvement in the left lateral venous insufficiency wound. Both in terms with an length rings are improving. I debrided this every 2 [name] with #5 curet and we have been using Hydrofera Blue and again making good progress With regards to the  wounds between his right first and second toe which I thought might of been tinea pedis he is not making as much progress very dry scaly skin over the area. Also the area at the base of the left first and second  toe in a similar condition 05/12/17; continued gradual improvement in the refractory left lateral venous insufficiency wound on the left. Dimension smaller. Surface still requiring debridement using Hydrofera Blue 05/19/17; continued gradual improvement in the refractory left lateral venous ulceration. Careful inspection of the wound bed underlying rumination suggested some degree of epithelialization over the surface no debridement indicated. Continue Hydrofera Blue difficult areas between his toes first and third on the left than first and second on the right. I'm going to change to silver alginate from silver collagen. Continue ketoconazole as I suspect underlying tinea pedis 05/26/17; left lateral leg venous insufficiency wound. We've been using Hydrofera Blue. I believe that there is expanding epithelialization over the surface of the wound albeit not coming from the wound circumference. This is a bit of an odd situation in which the epithelialization seems to be coming from the surface of the wound rather than in the exact circumference. There is still small open areas mostly along the lateral margin of the wound. ooHe has unchanged areas between the left first and second and the right first second toes which I been treating for tenia pedis 06/02/17; left lateral leg venous insufficiency wound. We have been using Hydrofera Blue. Somewhat smaller from the wound circumference. The surface of the wound remains a bit on it almost epithelialized sedation in appearance. I use an open curette today debridement in the surface of all of this especially the edges ooSmall open wounds remaining on the dorsal right first and second toe interspace and the plantar left first second toe and her face  on the left 06/09/17; wound on the left lateral leg continues to be smaller but very gradual and very dry surface using Hydrofera Blue 06/16/17 requires weekly debridements now on the left lateral leg although this continues to contract. I changed to silver collagen last week because of dryness of the wound bed. Using Iodoflex to the areas on his first and second toes/web space bilaterally 06/24/17; patient with history of paraplegia also chronic venous insufficiency with lymphedema. Has a very difficult wound on the left lateral leg. This has been gradually reducing in terms of with but comes in with a very dry adherent surface. High switch to silver collagen a week or so ago with hydrogel to keep the area moist. This is been refractory to multiple dressing attempts. He also has areas in his first and second toes bilaterally in the anterior and posterior web space. I had been using Iodoflex here after a prolonged course of silver alginate with ketoconazole was ineffective [question tinea pedis] 07/14/17; patient arrives today with a very difficult adherent material over his left lateral lower leg wound. He also has surrounding erythema and poorly controlled edema. He was switched his Santyl last visit which the nurses are applying once during his doctor visit and once on a nurse visit. He was also reduced to 2 layer compression I'm not exactly sure of the issue here. 07/21/17; better surface today after 1 week of Iodoflex. Significant cellulitis that we treated last week also better. [Doxycycline] 07/28/17 better surface today with now 2 [name] of Iodoflex. Significant cellulitis treated with doxycycline. He has now completed the doxycycline and he is back to his usual degree of chronic venous inflammation/stasis dermatitis. He reminds me he has had ablations surgery here 08/04/17; continued improvement with Iodoflex to the left lateral leg wound in terms of the surface of the wound although the  dimensions are better. He is not currently on any antibiotics, he has  the usual degree of chronic venous inflammation/stasis dermatitis. Problematic areas on the plantar aspect of the first second toe web space on the left and the dorsal aspect of the first second toe web space on the right. At one point I felt these were probably related to chronic fungal infections in treated him aggressively for this although we have not made any improvement here. 08/11/17; left lateral leg. Surface continues to improve with the Iodoflex although we are not seeing much improvement in overall wound dimensions. Areas on his plantar left foot and right foot show no improvement. In fact the right foot looks somewhat worse 08/18/17; left lateral leg. We changed to [facility] Blue last week after a prolonged course of Iodoflex which helps get the surface better. It appears that the wound with is improved. Continue with difficult areas on the left dorsal first second and plantar first second on the right 09/01/17; patient arrives in clinic today having had a temperature of 103 yesterday. He was seen in the ER and [facility]. The patient was concerned he could have cellulitis again in the right leg however they diagnosed him with a UTI and he is now on Keflex. He has a history of cellulitis which is been recurrent and difficult but this is been in the left leg, in the past 5 use doxycycline. He does in and out catheterizations at home which are risk factors for UTI 09/08/17; patient will be completing his Keflex this weekend. The erythema on the left leg is considerably better. He has a new wound today on the medial part of the right leg small superficial almost looks like a skin tear. He has worsening of the area on the right dorsal first and second toe. His major area on the left lateral leg is better. Using Hydrofera Blue on all areas 09/15/17; gradual reduction in width on the long wound in the left lateral leg. No  debridement required. He also has wounds on the plantar aspect of his left first second toe web space and on the dorsal aspect of the right first second toe web space. 09/22/17; there continues to be very gradual improvements in the dimensions of the left lateral leg wound. He hasn't round erythematous spot with might be pressure on his wheelchair. There is no evidence obviously of infection no purulence no warmth ooHe has a dry scaled area on the plantar aspect of the left first second toe ooImproved area on the dorsal right first second toe. 09/29/17; left lateral leg wound continues to improve in dimensions mostly with an is still a fairly long but increasingly narrow wound. ooHe has a dry scaled area on the plantar aspect of his left first second toe web space ooIncreasingly concerning area on the dorsal right first second toe. In fact I am concerned today about possible cellulitis around this wound. The areas extending up his second toe and although there is deformities here almost appears to abut on the nailbed. 10/06/17; left lateral leg wound continues to make very gradual progress. Tissue culture I did from the right first second toe dorsal foot last time grew MRSA and enterococcus which was vancomycin sensitive. This was not sensitive to clindamycin or doxycycline. He is allergic to Zyvox and sulfa we have therefore arrange for him to have dalvance infusion tomorrow. He is had this in the past and tolerated it well 10/20/17; left lateral leg wound continues to make decent progress. This is certainly reduced in terms of with there is advancing epithelialization.ooThe cellulitis in  the right foot looks better although he still has a deep wound in the dorsal aspect of the first second toe web space. Plantar left first toe web space on the left I think is making some progress 10/27/17; left lateral leg wound continues to make decent progress. Advancing epithelialization.using Hydrofera  Blue ooThe right first second toe web space wound is better-looking using silver alginate ooImprovement in the left plantar first second toe web space. Again using silver alginate 11/03/17 left lateral leg wound continues to make decent progress albeit slowly. Using Hydrofera Blue ooThe right per second toe web space continues to be a very problematic looking punched out wound. I obtained a piece of tissue for deep culture I did extensively treated this for fungus. It is difficult to imagine that this is a pressure area as the patient states other than going outside he doesn't really wear shoes at home ooThe left plantar first second toe web space looked fairly senescent. Necrotic edges. This required debridement oochange to Hydrofera Blue to all wound areas 11/10/17; left lateral leg wound continues to contract. Using Hydrofera Blue ooOn the right dorsal first second toe web space dorsally. Culture I did of this area last week grew MRSA there is not an easy oral option in this patient was multiple antibiotic allergies or intolerances. This was only a rare culture isolate I'm therefore going to use Bactroban under silver alginate ooOn the left plantar first second toe web space. Debridement is required here. This is also unchanged 11/17/17; left lateral leg wound continues to contract using Hydrofera Blue this is no longer the major issue. ooThe major concern here is the right first second toe web space. He now has an open area going from dorsally to the plantar aspect. There is now wound on the inner lateral part of the first toe. Not a very viable surface on this. There is erythema spreading medially into the forefoot. ooNo major change in the left first second toe plantar wound 11/24/17; left lateral leg wound continues to contract using Hydrofera Blue. Nice improvement today ooThe right first second toe web space all of this looks a lot less angry than last week. I have given him  clindamycin and topical Bactroban for MRSA and terbinafine for the possibility of underlining tinea pedis that I could not control with ketoconazole. Looks somewhat better ooThe area on the plantar left first second toe web space is weeping with dried debris around the wound 12/01/17; left lateral leg wound continues to contract he Hydrofera Blue. It is becoming thinner in terms of with nevertheless it is making good improvement. ooThe right first second toe web space looks less angry but still a large necrotic-looking wounds starting on the plantar aspect of the right foot extending between the toes and now extensively on the base of the right second toe. I gave him clindamycin and topical Bactroban for MRSA anterior benefiting for the possibility of underlying tinea pedis. Not looking better today ooThe area on the left first/second toe looks better. Debrided of necrotic debris 12/05/17* the patient was worked in urgently today because over the weekend he found blood on his incontinence bad when he woke up. He was found to have an ulcer by his wife who does most of his wound care. He came in today for [place] to look at this. He has not had a history of wounds in his buttocks in spite of his paraplegia. 12/08/17; seen in follow-up today at his usual appointment. He was seen earlier  this week and found to have a new wound on his buttock. We also follow him for wounds on the left lateral leg, left first second toe web space and right first second toe web space 12/15/17; we have been using Hydrofera Blue to the left lateral leg which has improved. The right first second toe web space has also improved. Left first second toe web space plantar aspect looks stable. The left buttock has worsened using Santyl. Apparently the buttock has drainage 12/22/17; we have been using Hydrofera Blue to the left lateral leg which continues to improve now 2 small wounds separated by normal skin. He tells [place] he had a  fever up to 100 yesterday he is prone to UTIs but has not noted anything different. He does in and out catheterizations. The area between the first and second toes today does not look good necrotic surface covered with what looks to be purulent drainage and erythema extending into the third toe. I had gotten this to something that I thought look better last time however it is not look good today. He also has a necrotic surface over the buttock wound which is expanded. I thought there might be infection under here so I removed a lot of the surface with a #5 curet though nothing look like it really needed culturing. He is been using Santyl to this area 12/27/17; his original wound on the left lateral leg continues to improve using Hydrofera Blue. I gave him samples of Baxdella although he was unable to take them out of fear for an allergic reaction ["lump in his throat"].the culture I did of the purulent drainage from his second toe last week showed both enterococcus and a set Enterobacter I was also concerned about the erythema on the bottom of his foot although paradoxically although this looks somewhat better today. 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 We've been using silver alginate here. They came up today that he sleeps in a recliner, I'm not sure why but I asked him to stop this 01/03/18; his original wound we've been using Hydrofera Blue is now separated into 2 areas. ooUlcer on his left buttock is better he is off the recliner and sleeping in bed ooFinally both wound areas between his first and second toes also looks some better 01/10/18; his original wound on the left lateral leg is now separated into 2 wounds we've been using Hydrofera Blue ooUlcer on his left buttock has some drainage. There is a small probing site going into muscle layer superiorly.using silver alginate -He arrives  today with a deep tissue injury on the left heel ooThe wound on the dorsal aspect of his first second toe on the left looks a lot betterusing silver alginate ketoconazole ooThe area on the first second toe web space on the right also looks a lot bette 01/17/18; his original wound on the left lateral leg continues to progress using Hydrofera Blue ooUlcer on his left buttock also is smaller surface healthier except for a small probing site going into the muscle layer superiorly. 2.4 cm of tunneling in this area ooDTI on his left heel we have only been offloading. Looks better than last week no threatened open no evidence of infection oothe wound on the dorsal aspect of the first second toe on the left continues to look like it's regressing we have only been using silver alginate and terbinafine orally ooThe area in  the first second toe web space on the right also looks to be a lot better using silver alginate and terbinafine I think this was prompted by tinea pedis 01/31/18; the patient was hospitalized in [place] last week apparently for a complicated UTI. He was discharged on cefepime he does in and out catheterizations. In the hospital he was discovered M I don't mild elevation of AST and ALTs and the terbinafine was stopped.predictably the pressure ulcer on his buttock looks betterusing silver alginate. The area on the left lateral leg also is better using Hydrofera Blue. The area between the first and second toes on the left better. First and second toes on the right still substantial but better. Finally the DTI on the left heel has held together and looks like it's resolving 02/07/18-he is here in follow-up evaluation for multiple ulcerations. He has new injury to the lateral aspect of the last issue a pressure ulcer, he states this is from adhesive removal trauma. He states he has tried multiple adhesive products with no success. All other ulcers appear stable. The left heel DTI is  resolving. We will continue with same treatment plan and follow-up next week. 02/14/18; follow-up for multiple areas. ooHe has a new area last week on the lateral aspect of his pressure ulcer more over the posterior trochanter. The original pressure ulcer looks quite stable has healthy granulation. 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 Almost fully epithelialized on the original superior area using Hydrofera Blue ooDTI on the left heel has peeled off this week to reveal a small superficial wound under denuded skin and subcutaneous tissue ooBoth areas between the first and second toes look better including nothing open on the left 02/21/18; ooThe patient's wounds on his left ischial tuberosity and posterior left greater trochanter actually looked better. He has a large area of irritation around the area which I think is contact dermatitis. I am doubtful that this is fungal ooHis original wound on the left lateral calf continues to improve we have been using Hydrofera Blue ooThere is no open area in the left first second toe web space although there is a lot of thick callus ooThe DTI on the left heel required debridement today of necrotic surface eschar and subcutaneous tissue using silver alginate ooFinally the area on the right first second toe webspace continues to contract using silver alginate and ketoconazole 02/28/18 ooLeft ischial tuberosity wounds look better using silver alginate. ooOriginal wound on the left calf only has one small open area left using Hydrofera Blue ooDTI on the left heel required debridement mostly removing skin from around this wound surface. Using silver alginate ooThe areas on the right first/second toe web space using silver alginate and ketoconazole 03/08/18 on evaluation today patient appears to be doing decently well as best I can tell in  regard to his wounds. This is the first time that I have seen him as he generally is followed by Dr. Dellia Nims. With that being said none of his wounds appear to be infected he does have an area where there is some skin covering what appears to be a new wound on the left dorsal surface of his great toe. This is right at the nail bed. With that being said I do believe that debrided away some of the excess skin can be of benefit in this regard. Otherwise he has been tolerating the dressing changes without complication. 03/14/18;  patient arrives today with the multiplicity of wounds that we are following. He has not been systemically unwell ooOriginal wound on the left lateral calf now only has 2 small open areas we've been using Hydrofera Blue which should continue ooThe deep tissue injury on the left heel requires debridement today. We've been using silver alginate ooThe left first second toe and the right first second toe are both are reminiscence what I think was tinea pedis. Apparently some of the callus Surface between the toes was removed last week when it started draining. ooPurulent drainage coming from the wound on the ischial tuberosity on the left. 03/21/18-He is here in follow-up evaluation for multiple wounds. 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 He tolerated debridement. The only change to last week's recommendations is to discontinue antifungal cream between toes. He will follow-up next week 03/28/18; following up for multiple wounds;Concern this week is streaking redness and swelling in the right foot. He is going to need antibiotics for this. 03/31/18; follow-up for right foot cellulitis. Streaking redness and swelling in the right foot on 03/28/18. 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 I put him on clindamycin 300 mg every 6 and brought him in for a quick check. He has an open  wound between his first and second toes on the right foot as a potential source. 04/04/18; ooRight foot cellulitis is resolving he is completing clindamycin. This is truly good news ooLeft lateral calf wound which is initial wound only has one small open area inferiorly this is close to healing out. He has compression stockings. We will use Hydrofera Blue right down to the epithelialization of this ooNonviable surface on the left heel which was initially pressure with a DTI. We've been using Hydrofera Blue. I'm going to switch this back to silver alginate ooLeft first second toe/tinea pedis this looks better using silver alginate ooRight first second toe tinea pedis using silver alginate ooLarge pressure ulcers on theLeft ischial tuberosity. Small wound here Looks better. I am uncertain about the surface over the large wound. Using silver alginate 04/11/18; ooCellulitis in the right foot is resolved ooLeft lateral calf wound which was his original wounds still has 2 tiny open areas remaining this is just about closed ooNonviable surface on the left heel is better but still requires debridement ooLeft first second toe/tinea pedis still open using silver alginate ooRight first second toe wound tinea pedis I asked him to go back to using ketoconazole and silver alginate ooLarge pressure ulcers on the left ischial tuberosity this shear injury here is resolved. Wound is smaller. No evidence of infection using silver alginate 04/18/18; ooPatient arrives with an intense area of cellulitis in the right mid lower calf extending into the right heel area. Bright red and warm. Smaller area on the left anterior leg. He has a significant history of MRSA. He will definitely need antibioticsoodoxycycline ooHe now has 2 open areas on the left ischial tuberosity the original large wound and now a satellite area which I think was above his initial satellite areas. Not a wonderful surface on this  satellite area surrounding erythema which looks like pressure related. ooHis left lateral calf wound again his original wound is just about closed ooLeft heel pressure injury still requiring debridement ooLeft first second toe looks a lot better using silver alginate ooRight first second toe also using silver alginate and ketoconazole cream also looks better 04/20/18; the patient was worked in  early today out of concerns with his cellulitis on the right leg. I had started him on doxycycline. This was 2 days ago. His wife was concerned about the swelling in the area. Also concerned about the left buttock. He has not been systemically unwell no fever chills. No nausea vomiting or diarrhea 04/25/18; the patient's left buttock wound is continued to deteriorate he is using Hydrofera Blue. He is still completing clindamycin for the cellulitis on the right leg although all of this looks better. 05/02/18 ooLeft buttock wound still with a lot of drainage and a very tightly adherent fibrinous necrotic surface. He has a deeper area superiorly ooThe left lateral calf wound is still closed ooDTI wound on the left heel necrotic surface especially the circumference using Iodoflex ooAreas between his left first second toe and right first second toe both look better. Dorsally and the right first second toe he had a necrotic surface although at smaller. In using silver alginate and ketoconazole. I did a culture last week which was a deep tissue culture of the reminiscence of the open wound on the right first second toe dorsally. This grew a few Acinetobacter and a few methicillin-resistant staph aureus. Nevertheless the area actually this week looked better. I didn't feel the need to specifically address this at least in terms of systemic antibiotics. 05/09/18; wounds are measuring larger more drainage per our intake. We are using Santyl covered with alginate on the large superficial buttock wounds, Iodosorb  on the left heel, ketoconazole and silver alginate to the dorsal first and second toes bilaterally. 05/16/18; ooThe area on his left buttock better in some aspects although the area superiorly over the ischial tuberosity required an extensive debridement.using Santyl ooLeft heel appears stable. Using Iodoflex ooThe areas between his first and second toes are not bad however there is spreading erythema up the dorsal aspect of his left foot this looks like cellulitis again. He is insensate the erythema is really very brilliant.o Erysipelas He went to [name] allergist days ago because he was itching part of this he had lab work done. This showed a white count of 15.1 with 70% neutrophils. Hemoglobin of 11.4 and a platelet count of 659,000. Last white count we had in Epic was a 2-1/2 years ago which was 25.9 but he was ill at the time. He was able to show me some lab work that was done by his primary physician the pattern is about the same. 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 But prompted me to go ahead and do x-rays of both feet and the pelvis rule out osteomyelitis. He also had a comprehensive metabolic panel this was reasonably normal his albumin was 3.7 liver function tests BUN/creatinine all normal 05/23/18; x-rays of both his feet from last week were negative for underlying pulmonary abnormality. The x-ray of his pelvis however showed mild irregularity in the left ischial which may represent some early osteomyelitis. The wound in the left ischial continues to get deeper clearly now exposed muscle. Each week necrotic surface material over this area. Whereas the rest of the wounds do not look so bad. ooThe left ischial wound we have been using Santyl and calcium alginate ooTo the left heel surface necrotic debris using Iodoflex ooThe left lateral leg is still healed ooAreas on the left dorsal foot and the right dorsal foot are about the  same. There is some inflammation on the left which might represent contact dermatitis, fungal  dermatitis I am doubtful cellulitis although this looks better than last week 05/30/18; CT scan done at Hospital did not show any osteomyelitis or abscess. Suggested the possibility of underlying cellulitis although I don't [name] a lot of evidence of this at the bedside ooThe wound itself on the left buttock/upper thigh actually looks somewhat better. No debridement ooLeft heel also looks better no debridement continue Iodoflex ooBoth dorsal first second toe spaces appear better using Lotrisone. Left still required debridement 06/06/18; ooIntake reported some purulent looking drainage from the left gluteal wound. Using Santyl and calcium alginate ooLeft heel looks better although still a nonviable surface requiring debridement ooThe left dorsal foot first/second webspace actually expanding and somewhat deeper. I may consider doing a shave biopsy of this area ooRight dorsal foot first/second webspace appears stable to improved. Using Lotrisone and silver alginate to both these areas 06/13/18 ooLeft gluteal surface looks better. Now separated in the 2 wounds. No debridement required. Still drainage. We'll continue silver alginate ooLeft heel continues to look better with Iodoflex continue this for at least another week ooOf his dorsal foot wounds the area on the left still has some depth although it looks better than last week. We've been using Lotrisone and silver alginate 06/20/18 ooLeft gluteal continues to look better healthy tissue ooLeft heel continues to look better healthy granulation wound is smaller. He is using Iodoflex and his long as this continues continue the Iodoflex ooDorsal right foot looks better unfortunately dorsal left foot does not. There is swelling and erythema of his forefoot. He had minor trauma to this several days ago but doesn't think this was enough to have caused  any tissue injury. Foot looks like cellulitis, we have had this problem before 06/27/18 on evaluation today patient appears to be doing a little worse in regard to his foot ulcer. 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 Both of which would really be his only options for treating this infection. In the past he has been given and effusion of Orbactiv. This is done very well for him in the past again it's one time dosing IV antibiotic therapy. Subsequently I do believe this is something we're gonna need to [name] about doing at this point in time. Currently his other wounds seem to be doing somewhat better in my pinion I'm pretty happy in that regard. 07/03/18 on evaluation today patient's wounds actually appear to be doing fairly well. He has been tolerating the dressing changes without complication. All in all he seems to be showing signs of improvement. In regard to the antibiotics he has been dealing with infectious disease since I saw him last week as far as getting this scheduled. In the end [facility]'s [facility] this coming Friday. In the meantime he has been continuing to perform the dressing changes in such as previous. There does not appear to be any evidence of infection worsengin at this time. 07/10/18; ooSince I last saw this man 2 [name] ago things have actually improved. IV antibiotics of resulted in less forefoot erythema although there is still some present. He is not systemically unwell ooLeft buttock wounds o2 now have no depth there is increased epithelialization Using silver alginate ooLeft heel still requires debridement using Iodoflex ooLeft dorsal foot still with a sizable wound about the size of  a border but healthy granulation ooRight dorsal foot still with a slitlike area using  silver alginate 07/18/18; the patient's cellulitis in the left foot is improved in fact I think it is on its way to resolving. ooLeft buttock wounds o2 both look better although the larger one has hypertension granulation we've been using silver alginate ooLeft heel has some thick circumferential redundant skin over the wound edge which will need to be removed today we've been using Iodoflex ooLeft dorsal foot is still a sizable wound required debridement using silver alginate ooThe right dorsal foot is just about closed only a small open area remains here 07/25/18; left foot cellulitis is resolved ooLeft buttock wounds o2 both look better. Hyper-granulation on the major area ooLeft heel as some debris over the surface but otherwise looks a healthier wound. Using silver collagen ooRight dorsal foot is just about closed 07/31/18; arrives with our intake nurse worried about purulent drainage from the buttock. We had hyper-granulation here last week ooHis buttock wounds o2 continue to look better ooLeft heel some debris over the surface but measuring smaller. ooRight dorsal foot unfortunately has openings between the toes ooLeft foot superficial wound looks less aggravated. 08/07/18 ooButtock wounds continue to look better although some of her granulation and the larger medial wound. silver alginate ooLeft heel continues to look a lot better.silver collagen ooLeft foot superficial wound looks less stable. Requires debridement. He has a new wound superficial area on the foot on the lateral dorsal foot. ooRight foot looks better using silver alginate without Lotrisone 08/14/2018; patient was in the ER last week diagnosed with a UTI. He is now on Cefpodoxime and Macrodantin. ooButtock wounds continued to be smaller. Using silver alginate ooLeft heel continues to look better using silver collagen ooLeft foot superficial wound looks as though it is improving ooRight dorsal foot  area is just about healed. 08/21/2018; patient is completed his antibiotics for his UTI. ooHe has 2 open areas on the buttocks. There is still not closed although the surface looks satisfactory. Using silver alginate ooLeft heel continues to improve using silver collagen ooThe bilateral dorsal foot areas which are at the base of his first and second toes/possible tinea pedis are actually stable on the left but worse on the right. The area on the left required debridement of necrotic surface. After debridement I obtained a specimen for PCR culture. ooThe right dorsal foot which is been just about healed last week is now reopened 08/28/2018; culture done on the left dorsal foot showed coag negative staph both staph epidermidis and Lugdunensis. I think this is worthwhile initiating systemic treatment. I will use doxycycline given his long list of allergies. The area on the left heel slightly improved but still requiring debridement. ooThe large wound on the buttock is just about closed whereas the smaller one is larger. Using silver alginate in this area 09/04/2018; patient is completing his doxycycline for the left foot although this continues to be a very difficult wound area with very adherent necrotic debris. We are using silver alginate to all his wounds right foot left foot and the small wounds on his buttock, silver collagen on the left heel. 09/11/2018; once again this patient has intense erythema and swelling of the left forefoot. Lesser degrees of erythema in the right foot. He has a long list of allergies and intolerances. I will reinstitute doxycycline. 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 Using silver alginate ooLeft heel also  looks better using silver collagen ooUnfortunately both the areas on his feet look worse. The area on the left first second webspace is now gone through to the plantar part of his foot. The area on  the left foot anteriorly is irritated with erythema and swelling in the forefoot. 09/25/2018 ooHis wound on the left plantar heel looks better. Using silver collagen ooThe area on the left buttock 2 small remnant areas. One is closed one is still open. Using silver alginate ooThe areas between both his first and second toes look worse. This in spite of long-standing antifungal therapy with ketoconazole and silver alginate which should have antifungal activity ooHe has small areas around his original wound on the left calf one is on the bottom of the original scar tissue and one superiorly both of these are small and superficial but again given wound history in this site this is worrisome 10/02/2018 ooLeft plantar heel continues to gradually contract using silver collagen ooLeft buttock wound is unchanged using silver alginate ooThe areas on his dorsal feet between his first and second toes bilaterally look about the same. 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 Small open area here. We have been using silver alginate 10/09/2018; ooLeft plantar heel continues to look healthy and contract. No debridement is required ooLeft buttock slightly smaller with a tape injury wound just below which was new this week ooDorsal feet somewhat improved I have been using clindamycin ooLeft lateral looks lower extremity the actual open area looks worse although a lot of this is epithelialized. I am going to change to silver collagen today He has a lot more swelling in the right leg although this is not pitting not red and not particularly warm there is a lot of spasm in the right leg usually indicative of people with paralysis of some underlying discomfort. We have reviewed his vascular status from 2017 he had a left  greater saphenous vein ablation. I wonder about referring him back to vascular surgery if the area on the left leg continues to deteriorate. 10/16/2018 in today for follow-up and management of multiple lower extremity ulcers. His left Buttock wound is much lower smaller and almost closed completely. The wound to the left ankle has began to reopen with Epithelialization and some adherent slough. He has multiple new areas to the left foot and leg. The left dorsal foot without much improvement. Wound present between left great webspace and 2nd toe. Erythema and edema present right leg. Right LE ultrasound obtained on 10/10/18 was negative for DVT. 10/23/2018; ooLeft buttock is closed over. Still dry macerated skin but there is no open wound. I suspect this is chronic pressure/moisture ooLeft lateral calf is quite a bit worse than when I saw this last. There is clearly drainage here he has macerated skin into the left plantar heel. We will change the primary dressing to alginate ooLeft dorsal foot has some improvement in overall wound area. Still using clindamycin and silver alginate ooRight dorsal foot about the same as the left using clindamycin and silver alginate ooThe erythema in the right leg has resolved. He is DVT rule out was negative ooLeft heel pressure area required debridement although the wound is smaller and the surface is health 10/26/2018 ooThe patient came back in for his nurse check today predominantly because of the drainage coming out of the  left lateral leg with a recent reopening of his original wound on the left lateral calf. He comes in today with a large amount of surrounding erythema around the wound extending from the calf into the ankle and even in the area on the dorsal foot. He is not systemically unwell. He is not febrile. Nevertheless this looks like cellulitis. We have been using silver alginate to the area. I changed him to a regular visit and I am going to  prescribe him doxycycline. The rationale here is a long list of medication intolerances and a history of MRSA. I did not [name] anything that I thought would provide a valuable culture 10/30/2018 ooFollow-up from his appointment 4 days ago with really an extensive area of cellulitis in the left calf left lateral ankle and left dorsal foot. I put him on doxycycline. He has a long list of medication allergies which are true allergy reactions. 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 In any case he is a lot better today. The erythema especially in the anterior and lateral left calf is better. He still has left ankle erythema. He also is complaining about increasing edema in the right leg we have only been using Kerlix Coban and he has been doing the wraps at home. Finally he has a spotty rash on the medial part of his upper left calf which looks like folliculitis or perhaps wrap occlusion type injury. Small superficial macules not pustules 11/06/18 patient arrives today with again a considerable degree of erythema around the wound on the left lateral calf extending into the dorsal ankle and dorsal foot. This is a lot worse than when I saw this last week. He is on doxycycline really with not a lot of improvement. He has not been systemically unwell Wounds on the; left heel actually looks improved. Original area on the left foot and proximity to the first and second toes looks about the same. He has superficial areas on the dorsal foot, anterior calf and then the reopening of his original wound on the left lateral calf which looks about the same ooThe only area he has on the right is the dorsal webspace first and second which is smaller. ooHe has a large area of dry erythematous skin on the left buttock small open area here. 11/13/2018; the patient arrives in much better condition. The erythema around the wound on the left lateral  calf is a lot better. Not sure whether this was the clindamycin or the TCA and ketoconazole or just in the improvement in edema control [stasis dermatitis]. In any case this is a lot better. The area on the left heel is very small and just about resolved using silver collagen we have been using silver alginate to the areas on his dorsal feet 11/20/2018; his wounds include the left lateral calf, left heel, dorsal aspects of both feet just proximal to the first second webspace. He is stable to slightly improved. I did not think any changes to his dressings were going to be necessary 11/27/2018 he has a reopening on the left buttock which is surrounded by what looks like tinea or perhaps some other form of dermatitis. The area on the left dorsal foot has some erythema around it I have marked this area but I am not sure whether this is cellulitis or not. Left heel is not closed. Left calf the reopening is really slightly longer and probably worse 1/13; in general things  look better and smaller except for the left dorsal foot. Area on the left heel is just about closed, left buttock looks better only a small wound remains in the skin looks better [using Lotrisone] 1/20; the area on the left heel only has a few remaining open areas here. Left lateral calf about the same in terms of size, left dorsal foot slightly larger right lateral foot still not closed. The area on the left buttock has no open wound and the surrounding skin looks a lot better 1/27; the area on the left heel is closed. Left lateral calf better but still requiring extensive debridements. The area on his left buttock is closed. He still has the open areas on the left dorsal foot which is slightly smaller in the right foot which is slightly expanded. We have been using Iodoflex on these areas as well 2/3; left heel is closed. Left lateral calf still requiring debridement using Iodoflex there is no open area on his left buttock however he  has dry scaly skin over a large area of this. Not really responding well to the Lotrisone. Finally the areas on his dorsal feet at the level of the first second webspace are slightly smaller on the right and about the same on the left. Both of these vigorously debrided with Anasept and gauze 2/10; left heel remains closed he has dry erythematous skin over the left buttock but there is no open wound here. Left lateral leg has come in and with. Still requiring debridement we have been using Iodoflex here. Finally the area on the left dorsal foot and right dorsal foot are really about the same extremely dry callused fissured areas. He does not yet have a dermatology appointment 2/17; left heel remains closed. He has a new open area on the left buttock. The area on the left lateral calf is bigger longer and still covered in necrotic debris. No major change in his foot areas bilaterally. I am awaiting for a dermatologist to look on this. We have been using ketoconazole I do not know that this is been doing any good at all. 2/24; left heel remains closed. The left buttock wound that was new reopening last week looks better. The left lateral calf appears better also although still requires debridement. The major area on his foot is the left first second also requiring debridement. We have been putting Prisma on all wounds. I do not believe that the ketoconazole has done too much good for his feet. He will use Lotrisone I am going to give him a 2-week course of terbinafine. We still do not have a dermatology appointment 3/2 left heel remains closed however there is skin over bone in this area I pointed this out to him today. The left buttock wound is epithelialized but still does not look completely stable. The area on the left leg required debridement were using silver collagen here. With regards to his feet we changed to Lotrisone last week and silver alginate. 3/9; left heel remains closed. Left  buttock remains closed. The area on the right foot is essentially closed. The left foot remains unchanged. Slightly smaller on the left lateral calf. Using silver collagen to both of these areas 3/16-Left heel remains closed. Area on right foot is closed. Left lateral calf above the lateral malleolus open wound requiring debridement with easy bleeding. Left dorsal wound proximal to first toe also debrided. Left ischial area open new. Patient has been using Prisma with wrapping every 3 days. Dermatology appointment  is apparently tomorrow.Patient has completed his terbinafine 2-week course with some apparent improvement according to him, there is still flaking and dry skin in his foot on the left 3/23; area on the right foot is reopened. The area on the left anterior foot is about the same still a very necrotic adherent surface. He still has the area on the left leg and reopening is on the left buttock. He apparently saw dermatology although I do not have a note. According to the patient who is usually fairly well informed they did not have any good ideas. Put him on oral terbinafine which she is been on before. 3/30; using silver collagen to all wounds. Apparently his dermatologist put him on doxycycline and rifampin presumably some culture grew staph. I do not have this result. He remains on terbinafine although I have used terbinafine on him before 4/6; patient has had a fairly substantial reopening on the right foot between the first and second toes. He is finished his terbinafine and I believe is on doxycycline and rifampin still as prescribed by dermatology. We have been using silver collagen to all his wounds although the patient reports that he thinks silver alginate does better on the wounds on his buttock. 4/13; the area on his left lateral calf about the same size but it did not require debridement. ooLeft dorsal foot just proximal to the webspace between the first and second toes is  about the same. Still nonviable surface. I note some superficial bronze discoloration of the dorsal part of his foot ooRight dorsal foot just proximal to the first and second toes also looks about the same. I still think there may be the same discoloration I noted above on the left ooLeft buttock wound looks about the same 4/20; left lateral calf appears to be gradually contracting using silver collagen. 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 The left dorsal foot wound is debrided of tightly adherent necrotic debris and really cleans up quite nicely. The right area is worse with expansion. I did not debride this it is now over the base of the second toe ooThe area on his left buttock is smaller no debridement is required using silver collagen 5/4; left calf continues to make good progress. ooHe arrives with erythema around the wounds on his dorsal foot which even extends to the plantar aspect. Very concerning for coexistent infection. He is finished the erythromycin I gave him for possible erythrasma this does not seem to have helped. ooThe area on the left foot is about the same base of the dorsal toes ooIs area on the buttock looks improved on the left 5/11; left calf and left buttock continued to make good progress. Left foot is about the same to slightly improved. ooMajor problem is on the right foot. He has not had an x-ray. Deep tissue culture I did last week showed both Enterobacter and E. coli. I did not change the doxycycline I put him on empirically although neither 1 of these were plated to doxycycline. He arrives today with the erythema looking worse on both the dorsal and plantar foot. Macerated skin on the bottom of the foot. he has not been systemically unwell 5/18-Patient returns at 1 week, left calf wound appears to be making some progress, left buttock wound appears slightly worse than last time, left foot  wound looks slightly better, right foot redness is marginally better. X-ray of both feet show no air or evidence of osteomyelitis. Patient is  finished his Omnicef and terbinafine. He continues to have macerated skin on the bottom of the left foot as well as right 5/26; left calf wound is better, left buttock wound appears to have multiple small superficial open areas with surrounding macerated skin. X-rays that I did last time showed no evidence of osteomyelitis in either foot. He is finished cefdinir and doxycycline. I do not think that he was on terbinafine. He continues to have a large superficial open area on the right foot anterior dorsal and slightly between the first and second toes. I did send him to dermatology 2 months ago or so wondering about whether they would do a fungal scraping. I do not believe they did but did do a culture. We have been using silver alginate to the toe areas, he has been using antifungals at home topically either ketoconazole or Lotrisone. We are using silver collagen on the left foot, silver alginate on the right, silver collagen on the left lateral leg and silver alginate on the left buttock 6/1; left buttock area is healed. We have the left dorsal foot, left lateral leg and right dorsal foot. We are using silver alginate to the areas on both feet and silver collagen to the area on his left lateral calf 6/8; the left buttock apparently reopened late last week. He is not really sure how this happened. He is tolerating the terbinafine. Using silver alginate to all wounds 6/15; left buttock wound is larger than last week but still superficial. ooCame in the clinic today with a report of purulence from the left lateral leg I did not identify any infection ooBoth areas on his dorsal feet appear to be better. He is tolerating the terbinafine. Using silver alginate to all wounds 6/22; left buttock is about the same this week, left calf quite a bit better. His left  foot is about the same however he comes in with erythema and warmth in the right forefoot once again. Culture that I gave him in the beginning of May showed Enterobacter and E. coli. I gave him doxycycline and things seem to improve although neither 1 of these organisms was specifically plated. 6/29; left buttock is larger and dry this week. Left lateral calf looks to me to be improved. Left dorsal foot also somewhat improved right foot completely unchanged. The erythema on the right foot is still present. He is completing the Ceftin dinner that I gave him empirically [[name] discussion above.) 7/6 - All wounds look to be stable and perhaps improved, the left buttock wound is slightly smaller, per patient bleeds easily, completed ceftin, the right foot redness is less, he is on terbinafine 7/13; left buttock wound about the same perhaps slightly narrower. Area on the left lateral leg continues to narrow. Left dorsal foot slightly smaller right foot about the same. We are using silver alginate on the right foot and Hydrofera Blue to the areas on the left. Unna boot on the left 2 layer compression on the right 7/20; left buttock wound absolutely the same. Area on lateral leg continues to get better. Left dorsal foot require debridement as did the right no major change in the 7/27; left buttock wound the same size necrotic debris over the surface. The area on the lateral leg is closed once again. His left foot looks better right foot about the same although there is some involvement now of the posterior first second toe area. He is still on terbinafine which I have given him for a month, not  certain a centimeter major change 06/25/19-All wounds appear to be slightly improved according to report, left buttock wound looks clean, both foot wounds have minimal to no debris the right dorsal foot has minimal slough. We are using Hydrofera Blue to the left and silver alginate to the right foot and ischial  wound. 8/10-Wounds all appear to be around the same, the right forefoot distal part has some redness which was not there before, however the wound looks clean and small. Ischial wound looks about the same with no changes 8/17; his wound on the left lateral calf which was his original chronic venous insufficiency wound remains closed. Since I last saw him the areas on the left dorsal foot right dorsal foot generally appear better but require debridement. The area on his left initial tuberosity appears somewhat larger to me perhaps hyper granulated and bleeds very easily. We have been using Hydrofera Blue to the left dorsal foot and silver alginate to everything else 8/24; left lateral calf remains closed. The areas on his dorsal feet on the webspace of the first and second toes bilaterally both look better. The area on the left buttock which is the pressure ulcer stage II slightly smaller. I change the dressing to Hydrofera Blue to all areas 8/31; left lateral calf remains closed. The area on his dorsal feet bilaterally look better. Using Hydrofera Blue. Still requiring debridement on the left foot. No change in the left buttock pressure ulcers however 9/14; left lateral calf remains closed. Dorsal feet look quite a bit better than 2 [name] ago. Flaking dry skin also a lot better with the ammonium lactate I gave him 2 [name] ago. The area on the left buttock is improved. He states that his Roho cushion developed a leak and he is getting a new one, in the interim he is offloading this vigorously 9/21; left calf remains closed. Left heel which was a possible DTI looks better this week. He had macerated tissue around the left dorsal foot right foot looks satisfactory and improved left buttock wound. I changed his dressings to his feet to silver alginate bilaterally. Continuing Hydrofera Blue on the left buttock. 9/28 left calf remains closed. Left heel did not develop anything [possible DTI] dry flaking  skin on the left dorsal foot. Right foot looks satisfactory. Improved left buttock wound. We are using silver alginate on his feet Hydrofera Blue on the buttock. I have asked him to go back to the Lotrisone on his feet including the wounds and surrounding areas 10/5; left calf remains closed. The areas on the left and right feet about the same. A lot of this is epithelialized however debris over the remaining open areas. He is using Lotrisone and silver alginate. The area on the left buttock using Hydrofera Blue 10/26. Patient has been out for 3 [name] secondary to Covid concerns. He tested negative but I think his wife tested positive. He comes in today with the left foot substantially worse, right foot about the same. Even more concerning he states that the area on his left buttock closed over but then reopened and is considerably deeper in one aspect than it was before [stage III wound] 11/2; left foot really about the same as last week. Quarter sized wound on the dorsal foot just proximal to the first second toes. Surrounding erythema with areas of denuded epithelium. This is not really much different looking. Did not look like cellulitis this time however. ooRight foot area about the same.. We have been using silver  alginate alginate on his toes ooLeft buttock still substantial irritated skin around the wound which I think looks somewhat better. We have been using Hydrofera Blue here. 11/9; left foot larger than last week and a very necrotic surface. Right foot I think is about the same perhaps slightly smaller. Debris around the circumference also addressed. Unfortunately on the left buttock there is been a decline. Satellite lesions below the major wound distally and now a an additional one posteriorly we have been using Hydrofera Blue but I think this is a pressure issue 11/16; left foot ulcer dorsally again a very adherent necrotic surface. Right foot is about the same. Not much  change in the pressure ulcer on his left buttock. 11/30; left foot ulcer dorsally basically the same as when I saw him 2 [name] ago. Very adherent fibrinous debris on the wound surface. Patient reports a lot of drainage as well. 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 We have been using Iodoflex, patient changed back to alginate because of the drainage. Area on his right dorsal foot really looks benign with a healthier surface certainly a lot better than on the left. Left buttock wounds all improved using Hydrofera Blue 12/7; left dorsal foot again no improvement. Tightly adherent debris. PCR culture I did last week only showed likely skin contaminant. I have gone ahead and done a punch biopsy of this which is about the last thing in terms of investigations I can think to do. He has known venous insufficiency and venous hypertension and this could be the issue here. The area on the right foot is about the same left buttock slightly worse according to our intake nurse secondary to [facility] Blue sticking to the wound 12/14; biopsy of the left foot that I did last time showed changes that could be related to wound healing/chronic stasis dermatitis phenomenon no neoplasm. We have been using silver alginate to both feet. I change the one on the left today to Sorbact and silver alginate to his other 2 wounds 12/28; the patient arrives with the following problems; ooMajor issue is the dorsal left foot which continues to be a larger deeper wound area. Still with a completely nonviable surface ooParadoxically the area mirror image on the right on the right dorsal foot appears to be getting better. ooHe had some loss of dry denuded skin from the lower part of his original wound on the left lateral calf. Some of this area looked a little vulnerable and for this reason we put him in wrap that on this side this week ooThe area on his left buttock is  larger. He still has the erythematous circular area which I think is a combination of pressure, sweat. This does not look like cellulitis or fungal dermatitis 11/26/2019; -Dorsal left foot large open wound with depth. Still debris over the surface. Using Sorbact ooThe area on the dorsal right foot paradoxically has closed over [facility] has a reopening on the left ankle laterally at the base of his original wound that extended up into the calf. This appears clean. ooThe left buttock wound is smaller but with very adherent necrotic debris over the surface. We have been using silver alginate here as well The patient had arterial studies done in 2017. He had biphasic waveforms at the dorsalis pedis and posterior tibial bilaterally. ABI in the left was 1.17. Digit waveforms were dampened. He has slight spasticity in the great toes I do not think a TBI would  be possible 1/11; the patient comes in today with a sizable reopening between the first and second toes on the right. This is not exactly in the same location where we have been treating wounds previously. According to our intake nurse this was actually fairly deep but 0.6 cm. The area on the left dorsal foot looks about the same the surface is somewhat cleaner using Sorbact, his MRI is in 2 days. We have not managed yet to get arterial studies. The new reopening on the left lateral calf looks somewhat better using alginate. The left buttock wound is about the same using alginate 1/18; the patient had his ARTERIAL studies which were quite normal. ABI in the right at 1.13 with triphasic/biphasic waveforms on the left ABI 1.06 again with triphasic/biphasic waveforms. It would not have been possible to have done a toe brachial index because of spasticity. We have been using Sorbac to the left foot alginate to the rest of his wounds on the right foot left lateral calf and left buttock 1/25; arrives in clinic with erythema and swelling of the left  forefoot worse over the first MTP area. This extends laterally dorsally and but also posteriorly. Still has an area on the left lateral part of the lower part of his calf wound it is eschared and clearly not closed. ooArea on the left buttock still with surrounding irritation and erythema. ooRight foot surface wound dorsally. The area between the right and first and second toes appears better. 2/1; ooThe left foot wound is about the same. Erythema slightly better I gave him a week of doxycycline empirically ooRight foot wound is more extensive extending between the toes to the plantar surface ooLeft lateral calf really no open surface on the inferior part of his original wound however the entire area still looks vulnerable ooAbsolutely no improvement in the left buttock wound required debridement. 2/8; the left foot is about the same. Erythema is slightly improved I gave him clindamycin last week. ooRight foot looks better he is using Lotrimin and silver alginate ooHe has a breakdown in the left lateral calf. Denuded epithelium which I have removed ooLeft buttock about the same were using Hydrofera Blue 2/15; left foot is about the same there is less surrounding erythema. Surface still has tightly adherent debris which I have debriding however not making any progress ooRight foot has a substantial wound on the medial right second toe between the first and second webspace. ooStill an open area on the left lateral calf distal area. ooButtock wound is about the same Objective Constitutional Vitals Time Taken: 7:54 AM, Height: 70 in, Weight: 216 lbs, BMI: 31, Temperature: 97.7 F, Pulse: 118 bpm, Respiratory Rate: 18 breaths/min, Blood Pressure: 113/60 mmHg. Integumentary (Hair, Skin) Wound #24 status is Open. Original cause of wound was Gradually Appeared. The wound is located on the Left,Dorsal Foot. The wound measures 3cm length x 2.7cm width x 0.2cm depth; 6.362cm^2 area and  1.272cm^3 volume. There is Fat Layer (Subcutaneous Tissue) Exposed exposed. There is no tunneling or undermining noted. There is a large amount of serosanguineous drainage noted. The wound margin is distinct with the outline attached to the wound base. There is small (1-33%) red granulation within the wound bed. There is a large (67-100%) amount of necrotic tissue within the wound bed including Adherent Slough. Wound #35 status is Open. Original cause of wound was Gradually Appeared. The wound is located on the Left Ischium. The wound measures 4cm length x 1.6cm width x 0.1cm depth; 5.027cm^2  area and 0.503cm^3 volume. There is Fat Layer (Subcutaneous Tissue) Exposed exposed. There is no tunneling or undermining noted. There is a medium amount of serosanguineous drainage noted. The wound margin is flat and intact. There is large (67-100%) red, friable granulation within the wound bed. There is no necrotic tissue within the wound bed. Wound #37 status is Open. Original cause of wound was Gradually Appeared. The wound is located on the Left,Lateral Malleolus. The wound measures 5cm length x 1cm width x 0.1cm depth; 3.927cm^2 area and 0.393cm^3 volume. There is Fat Layer (Subcutaneous Tissue) Exposed exposed. There is no tunneling or undermining noted. There is a medium amount of serosanguineous drainage noted. The wound margin is distinct with the outline attached to the wound base. There is medium (34-66%) pink, pale granulation within the wound bed. There is a medium (34-66%) amount of necrotic tissue within the wound bed including Adherent Slough. Wound #38 status is Open. Original cause of wound was Gradually Appeared. The wound is located on the Right Toe - Web between 1st and 2nd. The wound measures 4cm length x 4cm width x 0.5cm depth; 12.566cm^2 area and 6.283cm^3 volume. There is Fat Layer (Subcutaneous Tissue) Exposed exposed. There is no tunneling or undermining noted. There is a medium  amount of serosanguineous drainage noted. The wound margin is distinct with the outline attached to the wound base. There is medium (34-66%) pink, pale granulation within the wound bed. There is a medium (34-66%) amount of necrotic tissue within the wound bed including Adherent Slough. 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 There was a Excisional Skin/Subcutaneous Tissue Debridement with a total area of 8.1 sq cm performed by [name], MD. With the following instrument(s): Curette to remove Viable and Non-Viable tissue/material. Material removed includes Subcutaneous Tissue and Slough and. No specimens were taken. A time out was conducted at 08:35, prior to the start of the procedure. A Minimum amount of bleeding was controlled with Pressure. The procedure was tolerated well with a pain level of 0 throughout and a pain level of 0 following the procedure. Post Debridement Measurements: 3cm length x 2.7cm width x 0.2cm depth; 1.272cm^3 volume. Character of Wound/Ulcer Post Debridement is improved. 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 There was a Three Layer Compression Therapy Procedure by [name] Hurst, RN. Post procedure Diagnosis Wound #37: Same as Pre-Procedure Plan Follow-up Appointments: Return Appointment in 1 week. Dressing Change Frequency: Wound #24 Left,Dorsal Foot: Do not change entire dressing for one  week. Wound #35 Left Ischium: Change Dressing every other day. Wound #37 Left,Lateral Malleolus: Do not change entire dressing for one week. Wound #38 Right Toe - Web between 1st and 2nd: Change Dressing every other day. Skin Barriers/Peri-Wound Care: Antifungal cream - on toes on both feet daily Moisturizing lotion - both legs Other: - Triamcinolone cream Wound #24 Left,Dorsal Foot: Barrier cream Moisturizing lotion - to both legs and feet Wound #35 Left Ischium: Antifungal cream - mix with barrier cream Barrier cream Primary Wound  Dressing: Wound #24 Left,Dorsal Foot: Polymem Silver Wound #35 Left Ischium: Polymem Silver Wound #37 Left,Lateral Malleolus: Polymem Silver Wound #38 Right Toe - Web between 1st and 2nd: Calcium Alginate with Silver Secondary Dressing: Wound #24 Left,Dorsal Foot: Dry Gauze Heel Cup - add heel cup to left heel for protection. Wound #35 Left Ischium: Foam Border - or ABD pad and tape Wound #37 Left,Lateral Malleolus: Dry Gauze Wound #38 Right Toe - Web between 1st and 2nd: Kerlix/Rolled Gauze - secure with tape Dry Gauze Edema Control: 3 Layer Compression System - Left Lower Extremity Elevate legs to the level of the heart or above for 30 minutes daily and/or when sitting, a frequency of: - throughout the day Support Garment 30-40 mm/Hg pressure to: - Juxtalite to right leg Off-Loading: Low air-loss mattress (Group 2) Roho cushion for wheelchair Turn and reposition every 2 hours - out of wheelchair throughout the day, try to lay on sides, sleep in the bed not the recliner The following medication(s) was prescribed: terbinafine HCl oral 250 mg tablet 1 tablet oral daily for 2 [name] for tinea pedis starting 01/07/2020 1. Change the primary dressing to PolyMem silver on all wounds 2. I put terbinafine 250 mg a day for 2 [name] under the thought that we may have coexistent tinea here. This is something I have used in the past but there is not  much I have not used on the spot in the past 3. I have asked the patient to call his insurance about the MRI of the left foot this is a deep nonresponsive wound. Although he is not a diabetic this is something that needs advanced imaging. The last time we tried to do this clinic staff member was on the phone with an hour and then they told me my weight was 10 to 15 minutes. No doubt a planned response. This is the patient's responsibility now in my opinion Electronic Signature(s) Signed: 01/07/2020 6:16:05 PM By: [name] Ham MD Entered By: [name] on 01/07/2020 09:13:14 -------------------------------------------------------------------------------- SuperBill Details Patient Name: Date of Service: [name] 01/07/2020 Medical Record [id number] Patient Account Number: [id number] Date of Birth/Sex: Treating RN: 03-28-1986 (34 y.o. [name] Primary Care Provider: [place], [place] Other Clinician: Referring Provider: Treating Provider/Extender:Chyanna Flock, [name], [name] in Treatment: 209 Diagnosis Coding ICD-10 Codes Code Description L97.521 Non-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 L97.511 Non-pressure chronic ulcer of other part of right foot limited to breakdown of skin 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, complete 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, stage 3 L97.321 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 to breakdown of skin L03.116 Cellulitis of left lower limb Facility Procedures CPT4 Code Description: [id number] - DEB SUBQ TISSUE 20 SQ CM/< ICD-10 Diagnosis Description L97.521 Non-pressure chronic ulcer of other part of left foot limit Modifier: ed to breakdow Quantity: 1 n of skin Physician Procedures CPT4 Code Description: [id number] - WC PHYS SUBQ TISS 20 SQ CM ICD-10 Diagnosis Description L97.521 Non-pressure chronic ulcer of other part of left foot limit Modifier: ed to breakdow Quantity: 1 n of  skin Electronic Signature(s) Signed: 01/07/2020 6:16:05 PM By: [name] Ham MD Entered By: [name] on 01/07/2020 09:13:38

## 2020-01-08 DIAGNOSIS — Z6829 Body mass index (BMI) 29.0-29.9, adult: Secondary | ICD-10-CM | POA: Diagnosis not present

## 2020-01-08 DIAGNOSIS — R635 Abnormal weight gain: Secondary | ICD-10-CM | POA: Diagnosis not present

## 2020-01-08 DIAGNOSIS — I10 Essential (primary) hypertension: Secondary | ICD-10-CM | POA: Diagnosis not present

## 2020-01-09 NOTE — Progress Notes (Signed)
[name], [name] ([id number]) Visit Report for 01/07/2020 Arrival Information Details Patient Name: Date of Service: [name], [name] 01/07/2020 7:30 AM Medical Record [id number] Patient Account Number: [id number] Date of Birth/Sex: Treating RN: [birth year] (34 y.o. [name]) Carlene Coria Primary Care Zaynab Chipman: [place], [place] Other Clinician: Referring Neve Branscomb: Treating Camryn Lampson/Extender:Robson, [name], [name]: 209 Visit Information History Since Last Visit All ordered tests and consults were completed: No Patient Arrived: Wheel Chair Added or deleted any medications: No Arrival Time: 07:53 Any new allergies or adverse reactions: No Accompanied By: self Had a fall or experienced change in No activities of daily living that may affect Transfer Assistance: None risk of falls: Patient Identification Verified: Yes Signs or symptoms of abuse/neglect since last No Secondary Verification Process Completed: Yes visito Patient Requires Transmission-Based No Hospitalized since last visit: No Precautions: Implantable device outside of the clinic excluding No Patient Has Alerts: Yes cellular tissue based products placed in the center Patient Alerts: R ABI = since last visit: 1.0 Has Dressing in Place as Prescribed: Yes L ABI = Has Compression in Place as Prescribed: Yes 1.1 Pain Present Now: No Electronic Signature(s) Signed: 01/09/2020 5:45:19 PM By: Carlene Coria RN Entered By: Carlene Coria on 01/07/2020 07:54:01 -------------------------------------------------------------------------------- Compression Therapy Details Patient Name: Date of Service: [name], [name] 01/07/2020 7:30 AM Medical Record [id number] Patient Account Number: [id number] Date of Birth/Sex: Treating RN: Aug 12, 1986 (34 y.o. [name] Primary Care Mikias Lanz: [place], [place] Other Clinician: Referring Rodric Punch: Treating Una Yeomans/Extender:Robson, [name], [name] Weeks in  Treatment: 209 Compression Therapy Performed for Wound Wound #37 Left,Lateral Malleolus Assessment: Performed By: Clinician Levan Hurst, RN Compression Type: Three Layer Post Procedure Diagnosis Same as Pre-procedure Electronic Signature(s) Signed: 01/07/2020 6:14:31 PM By: Levan Hurst RN, BSN Entered By: Levan Hurst on 01/07/2020 08:38:49 -------------------------------------------------------------------------------- Encounter Discharge Information Details Patient Name: Date of Service: [name], [name] 01/07/2020 7:30 AM Medical Record [id number] Patient Account Number: [id number] Date of Birth/Sex: Treating RN: May 20, 1986 (34 y.o. [name] Primary Care Khadija Thier: [name], [name] Other Clinician: Referring Ghazal Pevey: Treating Ninetta Adelstein/Extender:Robson, [name], [name]: 209 Encounter Discharge Information Items Post Procedure Vitals Discharge Condition: Stable Temperature (F): 97.7 Ambulatory Status: Wheelchair Pulse (bpm): 118 Discharge Destination: Home Respiratory Rate (breaths/min): 18 Transportation: Private Auto Blood Pressure (mmHg): 113/60 Accompanied By: self Schedule Follow-up Appointment: Yes Clinical Summary of Care: Electronic Signature(s) Signed: 01/07/2020 6:19:44 PM By: Deon Pilling Entered By: Deon Pilling on 01/07/2020 09:01:06 -------------------------------------------------------------------------------- Lower Extremity Assessment Details Patient Name: Date of Service: [name] 01/07/2020 7:30 AM Medical Record [id number] Patient Account Number: [id number] Date of Birth/Sex: Treating RN: [birth year] (34 y.o. [name] Primary Care Linard Daft: [place], [place] Other Clinician: Referring Parley Pidcock: Treating Mariellen Blaney/Extender:Robson, [name], [name]: 209 Edema Assessment Assessed: [Left: No] [Right: No] Edema: [Left: Yes] [Right: Yes] Calf Left: Right: Point of  Measurement: 33 cm From Medial Instep 31 cm 33 cm Ankle Left: Right: Point of Measurement: 10 cm From Medial Instep 24 cm 23 cm Electronic Signature(s) Signed: 01/09/2020 5:45:19 PM By: Carlene Coria RN Entered By: Carlene Coria on 01/07/2020 08:11:41 -------------------------------------------------------------------------------- Multi Wound Chart Details Patient Name: Date of Service: [name] 01/07/2020 7:30 AM Medical Record [id number] Patient Account Number: [id number] Date of Birth/Sex: Treating RN: [birth year] (34 y.o. [name] Primary Care Kavya Haag: [name], [name] Other Clinician: Referring Rilda Bulls: Treating [name]/Extender:Robson, [name], [name]: 209 Vital Signs Height(in): 70 Pulse(bpm): 118 Weight(lbs): 216 Blood Pressure(mmHg): 113/60 Body Mass Index(BMI): 31 Temperature(F):  97.7 Respiratory 18 Rate(breaths/min): Photos: [24:No Photos] [35:No Photos] [37:No Photos] Wound Location: [24:Left, Dorsal Foot] [35:Left Ischium] [37:Left, Lateral Malleolus] Wounding Event: [24:Gradually Appeared] [35:Gradually Appeared] [37:Gradually Appeared] Primary Etiology: [24:Inflammatory] [35:Pressure Ulcer] [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] Comorbid History: [24:Sleep Apnea, Hypertension, Sleep Apnea, Hypertension, Sleep Apnea, Hypertension, Paraplegia] [35:Paraplegia] [37:Paraplegia] Date Acquired: [24:03/08/2018] [35:04/28/2019] [37:11/26/2019] Weeks of Treatment: [24:95] [35:36] [37:6] Wound Status: [24:Open] [35:Open] [37:Open] Clustered Wound: [24:Yes] [35:Yes] [37:No] Clustered Quantity: [24:1] [35:1] [37:N/A] Measurements L x W x D 3x2.7x0.2 [35:4x1.6x0.1] [37:5x1x0.1] (cm) Area (cm) : [24:6.362] [35:5.027] [37:3.927] Volume (cm) : [24:1.272] [35:0.503] [37:0.393] % Reduction in Area: [24:-2595.80%] [35:64.40%] [37:0.80%] % Reduction in Volume: -5200.00% [35:64.40%] [37:0.80%] Classification: [24:Full Thickness Without Exposed Support  Structures] [35:Category/Stage III] [37:Full Thickness Without Exposed Support Structures] Exudate Amount: [24:Large] [35:Medium] [37:Medium] Exudate Type: [24:Serosanguineous] [35:Serosanguineous] [37:Serosanguineous] Exudate Color: [24:red, brown] [35:red, brown] [37:red, brown] Wound Margin: [24:Distinct, outline attached] [35:Flat and Intact] [37:Distinct, outline attached] Granulation Amount: [24:Small (1-33%)] [35:Large (67-100%)] [37:Medium (34-66%)] Granulation Quality: [24:Red] [35:Red, Friable] [37:Pink, Pale] Necrotic Amount: [24:Large (67-100%)] [35:None Present (0%)] [37:Medium (34-66%)] Exposed Structures: [24:Fat Layer (Subcutaneous Tissue) Exposed: Yes Fascia: No Tendon: No Muscle: No Joint: No Bone: No] [35:Fat Layer (Subcutaneous Tissue) Exposed: Yes Fascia: No Tendon: No Muscle: No Joint: No Bone: No] [37:Fat Layer (Subcutaneous Tissue) Exposed: Yes  Fascia: No Tendon: No Muscle: No Joint: No Bone: No] Epithelialization: [24:None] [35:Small (1-33%)] [37:Small (1-33%)] Debridement: [24:Debridement - Excisional] [35:N/A] [37:N/A] Pre-procedure [24:08:35] [35:N/A] [37:N/A] Verification/Time Out Taken: Tissue Debrided: [24:Subcutaneous, Slough] [35:N/A] [37:N/A] Level: [24:Skin/Subcutaneous Tissue] [35:N/A] [37:N/A] Debridement Area (sq cm):8.1 [35:N/A] [37:N/A] Instrument: [24:Curette] [35:N/A] [37:N/A] Bleeding: [24:Minimum] [35:N/A] [37:N/A] Hemostasis Achieved: [24:Pressure] [35:N/A] [37:N/A] Procedural Pain: [24:0] [35:N/A] [37:N/A] Post Procedural Pain: [24:0] [35:N/A] [37:N/A] Debridement Treatment Procedure was tolerated [35:N/A] [37:N/A] Response: [24:well] Post Debridement [24:3x2.7x0.2] [35:N/A] [37:N/A] Measurements L x W x D (cm) Post Debridement [24:1.272] [35:N/A] [37:N/A] Volume: (cm) Procedures Performed: Debridement [35:N/A 38] [37:Compression Therapy N/A] Photos: [24:No Photos] [35:N/A] [37:N/A] Wound Location: [24:Right Toe - Web between N/A 1st  and 2nd] [37:N/A] Wounding Event: [24:Gradually Appeared] [35:N/A] [37:N/A] Primary Etiology: [24:Inflammatory] [35:N/A] [37:N/A] Comorbid History: [24:Sleep Apnea, Hypertension, N/A Paraplegia] [37:N/A] Date Acquired: [24:11/30/2019] [35:N/A] [37:N/A] Weeks of Treatment: [24:5] [35:N/A] [37:N/A] Wound Status: [24:Open] [35:N/A] [37:N/A] Clustered Wound: [24:No] [35:N/A] [37:N/A] Clustered Quantity: [24:N/A] [35:N/A] [37:N/A] Measurements L x W x D 4x4x0.5 [35:N/A] [37:N/A] (cm) Area (cm) : [24:12.566] [35:N/A] [37:N/A] Volume (cm) : [24:6.283] [35:N/A] [37:N/A] % Reduction in Area: [24:-3707.90%] [35:N/A] [37:N/A] % Reduction in Volume: -2619.90% [35:N/A] [37:N/A] Classification: [24:Full Thickness Without Exposed Support Structures] [35:N/A] [37:N/A] Exudate Amount: [24:Medium] [35:N/A] [37:N/A] Exudate Type: [24:Serosanguineous] [35:N/A] [37:N/A] Exudate Color: [24:red, brown] [35:N/A] [37:N/A] Wound Margin: [24:Distinct, outline attached N/A] [37:N/A] Granulation Amount: [24:Medium (34-66%)] [35:N/A] [37:N/A] Granulation Quality: [24:Pink, Pale] [35:N/A] [37:N/A] Necrotic Amount: [24:Medium (34-66%)] [35:N/A N/A] Exposed Structures: [24:Fat Layer (Subcutaneous Tissue) Exposed: Yes Fascia: No Tendon: No Muscle: No Joint: No Bone: No] [35:N/A N/A] Epithelialization: [24:None] [35:N/A N/A] Debridement: [24:N/A] [35:N/A N/A] Tissue Debrided: [24:N/A] [35:N/A N/A] Level: [24:N/A] [35:N/A N/A] Debridement Area (sq cm):N/A [35:N/A N/A] Instrument: [24:N/A] [35:N/A N/A] Bleeding: [24:N/A] [35:N/A N/A] Hemostasis Achieved: [24:N/A] [35:N/A N/A] Procedural Pain: [24:N/A] [35:N/A N/A] Post Procedural Pain: [24:N/A] [35:N/A N/A] Debridement Treatment N/A [35:N/A N/A] Response: Post Debridement [24:N/A] [35:N/A N/A] Measurements L x W x D (cm) Post Debridement [24:N/A] [35:N/A N/A] Volume: (cm) Procedures Performed: N/A [35:N/A N/A] Treatment Notes Wound #24 (Left, Dorsal  Foot) 1. Cleanse With Wound Cleanser Soap and water 2. Periwound Care Antifungal cream Barrier cream Moisturizing lotion  TCA Cream 3. Primary Dressing Applied Polymem Ag 4. Secondary Dressing Dry Gauze 6. Support Layer Applied 3 layer compression wrap Notes netting. Wound #35 (Left Ischium) 1. Cleanse With Wound Cleanser 3. Primary Dressing Applied Polymem Ag 4. Secondary Dressing Foam Border Dressing 5. Secured With Self Adhesive Bandage Wound #37 (Left, Lateral Malleolus) 1. Cleanse With Wound Cleanser Soap and water 2. Periwound Care Antifungal cream Barrier cream Moisturizing lotion TCA Cream 3. Primary Dressing Applied Polymem Ag 4. Secondary Dressing Dry Gauze 6. Support Layer Applied 3 layer compression wrap Notes netting. Wound #38 (Right Toe - Web between 1st and 2nd) 1. Cleanse With Wound Cleanser 2. Periwound Care Antifungal cream Barrier cream Moisturizing lotion TCA Cream 3. Primary Dressing Applied Calcium Alginate Ag 4. Secondary Dressing Dry Gauze Roll Gauze 5. Secured With [place] tape Notes Development worker, international aid) Signed: 01/07/2020 6:14:31 PM By: Levan Hurst RN, BSN Signed: 01/07/2020 6:16:05 PM By: Linton Ham MD Entered By: Linton Ham on 01/07/2020 09:06:26 -------------------------------------------------------------------------------- Multi-Disciplinary Care Plan Details Patient Name: Date of Service: CRIBBTramone, Bentzel 01/07/2020 7:30 AM Medical Record [id number] Patient Account Number: [id number] Date of Birth/Sex: Treating RN: 09-21-1986 (34 y.o. [name] Primary Care Clydean Posas: [name], [name] Other Clinician: Referring Timarie Labell: Treating Taelar Gronewold/Extender:Robson, [name], [name]: 209 Active Inactive Wound/Skin Impairment Nursing Diagnoses: Impaired tissue integrity Knowledge deficit related to ulceration/compromised skin integrity Goals: Patient/caregiver  will verbalize understanding of skin care regimen Date Initiated: 01/05/2016 Target Resolution Date: 01/18/2020 Goal Status: Active Ulcer/skin breakdown will have a volume reduction of 30% by week 4 Date Initiated: 01/05/2016 Date Inactivated: 12/22/2017 Target Resolution Date: 01/19/2018 Unmet Reason: complex Goal Status: Unmet wounds, infection Interventions: Assess patient/caregiver ability to obtain necessary supplies Assess ulceration(s) every visit Provide education on ulcer and skin care Notes: Electronic Signature(s) Signed: 01/07/2020 6:14:31 PM By: Levan Hurst RN, BSN Entered By: Levan Hurst on 01/07/2020 08:02:22 -------------------------------------------------------------------------------- Pain Assessment Details Patient Name: Date of Service: Rohde, [name] 01/07/2020 7:30 AM Medical Record [id number] Patient Account Number: [id number] Date of Birth/Sex: Treating RN: 12/07/1985 (34 y.o. [name] Primary Care Natasia Sanko: [place], [place] Other Clinician: Referring Lillyann Ahart: Treating Filiberto Wamble/Extender:Robson, [name], [name]: 209 Active Problems Location of Pain Severity and Description of Pain Patient Has Paino No Site Locations Pain Management and Medication Current Pain Management: Electronic Signature(s) Signed: 01/09/2020 5:45:19 PM By: Carlene Coria RN Entered By: Carlene Coria on 01/07/2020 07:54:38 -------------------------------------------------------------------------------- Patient/Caregiver Education Details Patient Name: Date of Service: CRIBBGrace Bushy 2/15/2021andnbsp7:30 AM Medical Record [id number] Patient Account Number: [id number] Date of Birth/Gender: 1986/11/09 (33 y.o. M) Treating RN: Levan Hurst Primary Care Physician: Janine Limbo Other Clinician: Referring Physician: Treating Physician/Extender:Robson, Pecola Leisure in Treatment: 209 Education Assessment Education Provided  To: Patient Education Topics Provided Wound/Skin Impairment: Methods: Explain/Verbal Responses: State content correctly Electronic Signature(s) Signed: 01/07/2020 6:14:31 PM By: Levan Hurst RN, BSN Entered By: Levan Hurst on 01/07/2020 08:02:34 -------------------------------------------------------------------------------- Wound Assessment Details Patient Name: Date of Service: Barman, [name] 01/07/2020 7:30 AM Medical Record [id number] Patient Account Number: [id number] Date of Birth/Sex: Treating RN: 04-06-1986 (34 y.o. [name] Primary Care Idalie Canto: [place], [place] Other Clinician: Referring Arvella Massingale: Treating Philmore Lepore/Extender:Robson, [name], [name]: 209 Wound Status Wound Number: 24 Primary Etiology: Inflammatory Wound Location: Left Foot - Dorsal Wound Status: Open Wounding Event: Gradually Appeared Comorbid Sleep Apnea, Hypertension, History: Paraplegia Date Acquired: 03/08/2018 Weeks Of Treatment: 95 Clustered Wound: Yes Photos Wound Measurements Length: (cm) 3 % Reduct Width: (cm) 2.7 %  Reduct Depth: (cm) 0.2 Epitheli Clustered Quantity: 1 Tunnelin Area: (cm) 6.362 Undermi Volume: (cm) 1.272 Wound Description Classification: Full Thickness Without Exposed Support Foul Odo Structures Slough/F Wound Distinct, outline attached Margin: Exudate Large Amount: Exudate Serosanguineous Type: Exudate red, brown Color: Wound Bed Granulation Amount: Small (1-33%) Granulation Quality: Red Fascia E Necrotic Amount: Large (67-100%) Fat Laye Necrotic Quality: Adherent Slough Tendon E Muscle E Joint Ex Bone Exposed r After Cleansing: No ibrino Yes Exposed Structure xposed: No r (Subcutaneous Tissue) Exposed: Yes xposed: No xposed: No posed: No : No ion in Area: -2595.8% ion in Volume: -5200% alization: None g: No ning: No Treatment Notes Wound #24 (Left, Dorsal Foot) 1. Cleanse With Wound Cleanser Soap  and water 2. Periwound Care Antifungal cream Barrier cream Moisturizing lotion TCA Cream 3. Primary Dressing Applied Polymem Ag 4. Secondary Dressing Dry Gauze 6. Support Layer Applied 3 layer compression wrap Notes netting. Electronic Signature(s) Signed: 01/07/2020 4:24:03 PM By: Mikeal Hawthorne EMT/HBOT Signed: 01/09/2020 5:45:19 PM By: Carlene Coria RN Entered By: Mikeal Hawthorne on 01/07/2020 15:57:46 -------------------------------------------------------------------------------- Wound Assessment Details Patient Name: Date of Service: Tomb, [name] 01/07/2020 7:30 AM Medical Record [id number] Patient Account Number: [id number] Date of Birth/Sex: Treating RN: 1986/01/18 (34 y.o. [name] Primary Care Gilmore List: [place], [place] Other Clinician: Referring Ellyanna Holton: Treating Keeleigh Terris/Extender:Robson, [name], [name]: 209 Wound Status Wound Number: 35 Primary Etiology: Pressure Ulcer Wound Location: Left Ischium Wound Status: Open Wounding Event: Gradually Appeared Comorbid Sleep Apnea, Hypertension, History: Paraplegia Date Acquired: 04/28/2019 Weeks Of Treatment: 36 Clustered Wound: Yes Photos Wound Measurements Length: (cm) 4 Width: (cm) 1.6 Depth: (cm) 0.1 Clustered Quantity: 1 Area: (cm) 5.027 Volume: (cm) 0.503 Wound Description Classification: Category/Stage III Wound Margin: Flat and Intact Exudate Amount: Medium Exudate Type: Serosanguineous Exudate Color: red, brown Wound Bed Granulation Amount: Large (67-100%) Granulation Quality: Red, Friable Necrotic Amount: None Present (0%) fter Cleansing: No ino No Exposed Structure ed: No ubcutaneous Tissue) Exposed: Yes ed: No ed: No d: No : No % Reduction in Area: 64.4% % Reduction in Volume: 64.4% Epithelialization: Small (1-33%) Tunneling: No Undermining: No Foul Odor A Slough/Fibr Fascia Expos Fat Layer (S Tendon Expos Muscle Expos Joint Expose Bone  Exposed Treatment Notes Wound #35 (Left Ischium) 1. Cleanse With Wound Cleanser 3. Primary Dressing Applied Polymem Ag 4. Secondary Dressing Foam Border Dressing 5. Secured With Office manager) Signed: 01/07/2020 4:24:03 PM By: Mikeal Hawthorne EMT/HBOT Signed: 01/09/2020 5:45:19 PM By: Carlene Coria RN Entered By: Mikeal Hawthorne on 01/07/2020 15:58:15 -------------------------------------------------------------------------------- Wound Assessment Details Patient Name: Date of Service: Dahms, [name] 01/07/2020 7:30 AM Medical Record [id number] Patient Account Number: [id number] Date of Birth/Sex: Treating RN: [birth year] (34 y.o. [name] Primary Care Island Dohmen: [place], [place] Other Clinician: Referring Leanthony Rhett: Treating Sophonie Goforth/Extender:Robson, [name], [name]: 209 Wound Status Wound Number: 37 Primary Etiology: Venous Leg Ulcer Wound Location: Left Malleolus - Lateral Wound Status: Open Wounding Event: Gradually Appeared Comorbid Sleep Apnea, Hypertension, History: Paraplegia Date Acquired: 11/26/2019 Weeks Of Treatment: 6 Clustered Wound: No Photos Wound Measurements Length: (cm) 5 % Reduct Width: (cm) 1 % Reduct Depth: (cm) 0.1 Epitheli Area: (cm) 3.927 Tunneli Volume: (cm) 0.393 Undermi Wound Description Classification: Full Thickness Without Exposed Support Foul Odo Structures Slough/F Wound Distinct, outline attached Margin: Exudate Medium Amount: Exudate Serosanguineous Type: Exudate red, brown Color: Wound Bed Granulation Amount: Medium (34-66%) Granulation Quality: Pink, Pale Fascia E Necrotic Amount: Medium (34-66%) Fat Laye Necrotic Quality: Adherent Slough Tendon  E Muscle E Joint Ex Bone Exp r After Cleansing: No ibrino Yes Exposed Structure xposed: No r (Subcutaneous Tissue) Exposed: Yes xposed: No xposed: No posed: No osed: No ion in Area: 0.8% ion in Volume:  0.8% alization: Small (1-33%) ng: No ning: No Treatment Notes Wound #37 (Left, Lateral Malleolus) 1. Cleanse With Wound Cleanser Soap and water 2. Periwound Care Antifungal cream Barrier cream Moisturizing lotion TCA Cream 3. Primary Dressing Applied Polymem Ag 4. Secondary Dressing Dry Gauze 6. Support Layer Applied 3 layer compression wrap Notes netting. Electronic Signature(s) Signed: 01/07/2020 4:24:03 PM By: Mikeal Hawthorne EMT/HBOT Signed: 01/09/2020 5:45:19 PM By: Carlene Coria RN Entered By: Mikeal Hawthorne on 01/07/2020 15:56:23 -------------------------------------------------------------------------------- Wound Assessment Details Patient Name: Date of Service: [place], [name] 01/07/2020 7:30 AM Medical Record [id number] Patient Account Number: [id number] Date of Birth/Sex: Treating RN: 05-16-1986 (34 y.o. [name] Primary Care Brant Peets: [name], [name] Other Clinician: Referring Delrae Hagey: Treating Normand Damron/Extender:Robson, [name], [name]: 209 Wound Status Wound Number: 38 Primary Etiology: Inflammatory Wound Location: Right Toe - Web between 1st and Wound Status: Open 2nd Comorbid Sleep Apnea, Hypertension, Wounding Event: Gradually Appeared History: Paraplegia Date Acquired: 11/30/2019 Weeks Of Treatment: 5 Clustered Wound: No Photos Wound Measurements Length: (cm) 4 % Reduction Width: (cm) 4 % Reduction Depth: (cm) 0.5 Epitheli Area: (cm) 12.566 Tunneli Volume: (cm) 6.283 Undermi Wound Description Classification: Full Thickness Without Exposed Support Foul Od Structures Slough/ Wound Distinct, outline attached Margin: Exudate Medium Amount: Exudate Serosanguineous Type: Exudate red, brown Color: Wound Bed Granulation Amount: Medium (34-66%) Granulation Quality: Pink, Pale Fascia E Necrotic Amount: Medium (34-66%) Fat Laye Necrotic Quality: Adherent Slough Tendon E Muscle E Joint Ex Bone Exp or  After Cleansing: No Fibrino Yes Exposed Structure xposed: No r (Subcutaneous Tissue) Exposed: Yes xposed: No xposed: No posed: No osed: No in Area: -3707.9% in Volume: -2619.9% alization: None ng: No ning: No Treatment Notes Wound #38 (Right Toe - Web between 1st and 2nd) 1. Cleanse With Wound Cleanser 2. Periwound Care Antifungal cream Barrier cream Moisturizing lotion TCA Cream 3. Primary Dressing Applied Calcium Alginate Ag 4. Secondary Dressing Dry Gauze Roll Gauze 5. Secured With [employer] tape Notes Development worker, international aid) Signed: 01/07/2020 4:24:03 PM By: Mikeal Hawthorne EMT/HBOT Signed: 01/09/2020 5:45:19 PM By: Carlene Coria RN Entered By: Mikeal Hawthorne on 01/07/2020 15:55:52 -------------------------------------------------------------------------------- Vitals Details Patient Name: Date of Service: Pletz, [name] 01/07/2020 7:30 AM Medical Record [id number] Patient Account Number: [id number] Date of Birth/Sex: Treating RN: January 23, 1986 (33 y.o. [name] Primary Care Lynnlee Revels: [place], [place] Other Clinician: Referring Glennon Kopko: Treating Emir Nack/Extender:Robson, [name], [name]: 209 Vital Signs Time Taken: 07:54 Temperature (F): 97.7 Height (in): 70 Pulse (bpm): 118 Weight (lbs): 216 Respiratory Rate (breaths/min): 18 Body Mass Index (BMI): 31 Blood Pressure (mmHg): 113/60 Reference Range: 80 - 120 mg / dl Electronic Signature(s) Signed: 01/09/2020 5:45:19 PM By: Carlene Coria RN Entered By: Carlene Coria on 01/07/2020 07:54:29

## 2020-01-14 ENCOUNTER — Other Ambulatory Visit: Payer: Self-pay

## 2020-01-14 ENCOUNTER — Encounter (HOSPITAL_BASED_OUTPATIENT_CLINIC_OR_DEPARTMENT_OTHER): Payer: BC Managed Care – PPO | Admitting: Internal Medicine

## 2020-01-14 DIAGNOSIS — I10 Essential (primary) hypertension: Secondary | ICD-10-CM | POA: Diagnosis not present

## 2020-01-14 DIAGNOSIS — Z9081 Acquired absence of spleen: Secondary | ICD-10-CM | POA: Diagnosis not present

## 2020-01-14 DIAGNOSIS — L03116 Cellulitis of left lower limb: Secondary | ICD-10-CM | POA: Diagnosis not present

## 2020-01-14 DIAGNOSIS — Z87891 Personal history of nicotine dependence: Secondary | ICD-10-CM | POA: Diagnosis not present

## 2020-01-14 DIAGNOSIS — L03115 Cellulitis of right lower limb: Secondary | ICD-10-CM | POA: Diagnosis not present

## 2020-01-14 DIAGNOSIS — G8221 Paraplegia, complete: Secondary | ICD-10-CM | POA: Diagnosis not present

## 2020-01-14 DIAGNOSIS — Z8614 Personal history of Methicillin resistant Staphylococcus aureus infection: Secondary | ICD-10-CM | POA: Diagnosis not present

## 2020-01-14 DIAGNOSIS — G4730 Sleep apnea, unspecified: Secondary | ICD-10-CM | POA: Diagnosis not present

## 2020-01-14 DIAGNOSIS — Z888 Allergy status to other drugs, medicaments and biological substances status: Secondary | ICD-10-CM | POA: Diagnosis not present

## 2020-01-14 DIAGNOSIS — L97321 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 to breakdown of skin: Secondary | ICD-10-CM | POA: Diagnosis not present

## 2020-01-14 DIAGNOSIS — I872 Venous insufficiency (chronic) (peripheral): Secondary | ICD-10-CM | POA: Diagnosis not present

## 2020-01-14 DIAGNOSIS — Z882 Allergy status to sulfonamides status: Secondary | ICD-10-CM | POA: Diagnosis not present

## 2020-01-14 DIAGNOSIS — L97521 Non-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: Secondary | ICD-10-CM | POA: Diagnosis not present

## 2020-01-14 DIAGNOSIS — L97322 Non-pressure chronic ulcer of left 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: Secondary | ICD-10-CM | POA: Diagnosis not present

## 2020-01-14 DIAGNOSIS — L97522 Non-pressure chronic ulcer of other part of left foot with fat layer exposed: Secondary | ICD-10-CM | POA: Diagnosis not present

## 2020-01-14 DIAGNOSIS — L97512 Non-pressure chronic ulcer of other part of right foot with fat layer exposed: Secondary | ICD-10-CM | POA: Diagnosis not present

## 2020-01-14 DIAGNOSIS — L97511 Non-pressure chronic ulcer of other part of right foot limited to breakdown of skin: Secondary | ICD-10-CM | POA: Diagnosis not present

## 2020-01-14 DIAGNOSIS — I890 Lymphedema, not elsewhere classified: Secondary | ICD-10-CM | POA: Diagnosis not present

## 2020-01-14 DIAGNOSIS — L89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, stage 3: Secondary | ICD-10-CM | POA: Diagnosis not present

## 2020-01-17 NOTE — Progress Notes (Signed)
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 between the first and second toe on the right still remains problematic 04/21/17; continued very gradual improvement. Using [facility] 04/28/17; continued very gradual improvement in the left lateral leg venous insufficiency wound. His periwound erythema is very mild. We have been using Hydrofera Blue. Wound is making progress especially in the superior 50% 05/05/17; he continues to have very gradual improvement in the left lateral venous insufficiency wound. Both in terms with an length rings are improving. I debrided this every 2 weeks with #5 curet and we have been using Hydrofera Blue and again making good progress With regards to the wounds between his right first and second toe which I thought might of been tinea pedis he is not making as much progress very dry scaly skin over the area. Also the area at the base of the left first and second toe in a similar condition 05/12/17; continued gradual improvement in the refractory left lateral venous insufficiency wound on the left. Dimension smaller. Surface still requiring debridement using Hydrofera  Blue 05/19/17; continued gradual improvement in the refractory left lateral venous ulceration. Careful inspection of the wound bed underlying rumination suggested some degree of epithelialization over the surface no debridement indicated. Continue Hydrofera Blue difficult areas between his toes first and third on the left than first and second on the right. I'm going to change to silver alginate from silver collagen. Continue ketoconazole as I suspect underlying tinea pedis 05/26/17; left lateral leg venous insufficiency wound. We've been using Hydrofera Blue. I believe that there is expanding epithelialization over the surface of the wound albeit not coming from  the wound circumference. This is a bit of an odd situation in which the epithelialization seems to be coming from the surface of the wound rather than in the exact circumference. There is still small open areas mostly along the lateral margin of the wound. He has unchanged areas between the left first and second and the right first second toes which I been treating for tenia pedis 06/02/17; left lateral leg venous insufficiency wound. We have been using Hydrofera Blue. Somewhat smaller from the wound circumference. The surface of the wound remains a bit on it almost epithelialized sedation in appearance. I use an open curette today debridement in the surface of all of this especially the edges Small open wounds remaining on the dorsal right first and second toe interspace and the plantar left first second toe and her face on the left 06/09/17; wound on the left lateral leg continues to be smaller but very gradual and very dry surface using Hydrofera Blue 06/16/17 requires weekly debridements now on the left lateral leg although this continues to contract. I changed to silver collagen last week because of dryness of the wound bed. Using Iodoflex to the areas on his first and second toes/web space bilaterally 06/24/17; patient with history of  paraplegia also chronic venous insufficiency with lymphedema. Has a very difficult wound on the left lateral leg. This has been gradually reducing in terms of with but comes in with a very dry adherent surface. High switch to silver collagen a week or so ago with hydrogel to keep the area moist. This is been refractory to multiple dressing attempts. He also has areas in his first and second toes bilaterally in the anterior and posterior web space. I had been using Iodoflex here after a prolonged course of silver alginate with ketoconazole was ineffective [question tinea pedis] 07/14/17; patient arrives today with a very difficult adherent material over his left lateral lower leg wound. He also has surrounding erythema and poorly controlled edema. He was switched his Santyl last visit which the nurses are applying once during his doctor visit and once on a nurse visit. He was also reduced to 2 layer compression I'm not exactly sure of the issue here. 07/21/17; better surface today after 1 week of Iodoflex. Significant cellulitis that we treated last week also better. [Doxycycline] 07/28/17 better surface today with now 2 weeks of Iodoflex. Significant cellulitis treated with doxycycline. He has now completed the doxycycline and he is back to his usual degree of chronic venous inflammation/stasis dermatitis. He reminds me he has had ablations surgery here 08/04/17; continued improvement with Iodoflex to the left lateral leg wound in terms of the surface of the wound although the dimensions are better. 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 Problematic areas on the plantar aspect of the first second toe web space on the left and the dorsal aspect of the first second toe web space on the right. At one point I felt these were probably related to chronic fungal infections in treated him aggressively for this although we have not made  any improvement here. 08/11/17; left lateral leg. Surface continues to improve with the Iodoflex although we are not seeing much improvement in overall wound dimensions. Areas on his plantar left foot and right foot show no improvement. In fact the right foot looks somewhat worse 08/18/17; left lateral leg. We changed to [facility] Blue last week after a prolonged course of Iodoflex which helps get  the surface better. It appears that the wound with is improved. Continue with difficult areas on the left dorsal first second and plantar first second on the right 09/01/17; patient arrives in clinic today having had a temperature of 103 yesterday. He was seen in the ER and [facility]. The patient was concerned he could have cellulitis again in the right leg however they diagnosed him with a UTI and he is now on Keflex. He has a history of cellulitis which is been recurrent and difficult but this is been in the left leg, in the past 5 use doxycycline. He does in and out catheterizations at home which are risk factors for UTI 09/08/17; patient will be completing his Keflex this weekend. The erythema on the left leg is considerably better. He has a new wound today on the medial part of the right leg small superficial almost looks like a skin tear. He has worsening of the area on the right dorsal first and second toe. His major area on the left lateral leg is better. Using Hydrofera Blue on all areas 09/15/17; gradual reduction in width on the long wound in the left lateral leg. No debridement required. He also has wounds on the plantar aspect of his left first second toe web space and on the dorsal aspect of the right first second toe web space. 09/22/17; there continues to be very gradual improvements in the dimensions of the left lateral leg wound. He hasn't round erythematous spot with might be pressure on his wheelchair. There is no evidence obviously of infection no purulence no warmth He has a dry  scaled area on the plantar aspect of the left first second toe Improved area on the dorsal right first second toe. 09/29/17; left lateral leg wound continues to improve in dimensions mostly with an is still a fairly long but increasingly narrow wound. He has a dry scaled area on the plantar aspect of his left first second toe web space Increasingly concerning area on the dorsal right first second toe. In fact I am concerned today about possible cellulitis around this wound. The areas extending up his second toe and although there is deformities here almost appears to abut on the nailbed. 10/06/17; left lateral leg wound continues to make very gradual progress. Tissue culture I did from the right first second toe dorsal foot last time grew MRSA and enterococcus which was vancomycin sensitive. This was not sensitive to clindamycin or doxycycline. He is allergic to Zyvox and sulfa we have therefore arrange for him to have dalvance infusion tomorrow. He is had this in the past and tolerated it well 10/20/17; left lateral leg wound continues to make decent progress. This is certainly reduced in terms of with there is advancing epithelialization.The cellulitis in the right foot looks better although he still has a deep wound in the dorsal aspect of the first second toe web space. Plantar left first toe web space on the left I think is making some progress 10/27/17; left lateral leg wound continues to make decent progress. Advancing epithelialization.using Hydrofera Blue The right first second toe web space wound is better-looking using silver alginate Improvement in the left plantar first second toe web space. Again using silver alginate 11/03/17 left lateral leg wound continues to make decent progress albeit slowly. Using [facility] The right per second toe web space continues to be a very problematic looking punched out wound. I obtained a piece of tissue for deep culture I did extensively treated  this for  fungus. It is difficult to imagine that this is a pressure area as the patient states other than going outside he doesn't really wear shoes at home The left plantar first second toe web space looked fairly senescent. Necrotic edges. This required debridement change to [facility] Blue to all wound areas 11/10/17; left lateral leg wound continues to contract. Using Hydrofera Blue On the right dorsal first second toe web space dorsally. Culture I did of this area last week grew MRSA there is not an easy oral option in this patient was multiple antibiotic allergies or intolerances. This was only a rare culture isolate I'm therefore going to use Bactroban under silver alginate On the left plantar first second toe web space. Debridement is required here. This is also unchanged 11/17/17; left lateral leg wound continues to contract using Hydrofera Blue this is no longer the major issue. The major concern here is the right first second toe web space. He now has an open area going from dorsally to the plantar aspect. There is now wound on the inner lateral part of the first toe. Not a very viable surface on this. There is erythema spreading medially into the forefoot. No major change in the left first second toe plantar wound 11/24/17; left lateral leg wound continues to contract using Hydrofera Blue. Nice improvement today The right first second toe web space all of this looks a lot less angry than last week. I have given him clindamycin and topical Bactroban for MRSA and terbinafine for the possibility of underlining tinea pedis that I could not control with ketoconazole. Looks somewhat better The area on the plantar left first second toe web space is weeping with dried debris around the wound 12/01/17; left lateral leg wound continues to contract he Hydrofera Blue. It is becoming thinner in terms of with nevertheless it is making good improvement. The right first second toe web space looks less  angry but still a large necrotic-looking wounds starting on the plantar aspect of the right foot extending between the toes and now extensively on the base of the right second toe. I gave him clindamycin and topical Bactroban for MRSA anterior benefiting for the possibility of underlying tinea pedis. Not looking better today The area on the left first/second toe looks better. Debrided of necrotic debris 12/05/17* the patient was worked in urgently today because over the weekend he found blood on his incontinence bad when he woke up. He was found to have an ulcer by his wife who does most of his wound care. He came in today for [place] to look at this. He has not had a history of wounds in his buttocks in spite of his paraplegia. 12/08/17; seen in follow-up today at his usual appointment. He was seen earlier this week and found to have a new wound on his buttock. We also follow him for wounds on the left lateral leg, left first second toe web space and right first second toe web space 12/15/17; we have been using Hydrofera Blue to the left lateral leg which has improved. The right first second toe web space has also improved. Left first second toe web space plantar aspect looks stable. The left buttock has worsened using Santyl. Apparently the buttock has drainage 12/22/17; we have been using Hydrofera Blue to the left lateral leg which continues to improve now 2 small wounds separated by normal skin. He tells [place] he had a fever up to 100 yesterday he is prone to UTIs but has not noted  anything different. He does in and out catheterizations. The area between the first and second toes today does not look good necrotic surface covered with what looks to be purulent drainage and erythema extending into the third toe. I had gotten this to something that I thought look better last time however it is not look good today. He also has a necrotic surface over the buttock wound which is expanded. I thought there  might be infection under here so I removed a lot of the surface with a #5 curet though nothing look like it really needed culturing. He is been using Santyl to this area 12/27/17; his original wound on the left lateral leg continues to improve using Hydrofera Blue. I gave him samples of Baxdella although he was unable to take them out of fear for an allergic reaction ["lump in his throat"].the culture I did of the purulent drainage from his second toe last week showed both enterococcus and a set Enterobacter I was also concerned about the erythema on the bottom of his foot although paradoxically although this looks somewhat better today. 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 We've been using silver alginate here. They came up today that he sleeps in a recliner, I'm not sure why but I asked him to stop this 01/03/18; his original wound we've been using Hydrofera Blue is now separated into 2 areas. Ulcer on his left buttock is better he is off the recliner and sleeping in bed Finally both wound areas between his first and second toes also looks some better 01/10/18; his original wound on the left lateral leg is now separated into 2 wounds we've been using Hydrofera Blue Ulcer on his left buttock has some drainage. There is a small probing site going into muscle layer superiorly.using silver alginate -He arrives today with a deep tissue injury on the left heel The wound on the dorsal aspect of his first second toe on the left looks a lot betterusing silver alginate ketoconazole The area on the first second toe web space on the right also looks a lot bette 01/17/18; his original wound on the left lateral leg continues to progress using Hydrofera Blue Ulcer on his left buttock also is smaller surface healthier except for a small probing site going into the muscle layer superiorly. 2.4 cm of tunneling in  this area DTI on his left heel we have only been offloading. Looks better than last week no threatened open no evidence of infection the wound on the dorsal aspect of the first second toe on the left continues to look like it's regressing we have only been using silver alginate and terbinafine orally The area in the first second toe web space on the right also looks to be a lot better using silver alginate and terbinafine I think this was prompted by tinea pedis 01/31/18; the patient was hospitalized in [place] last week apparently for a complicated UTI. He was discharged on cefepime he does in and out catheterizations. In the hospital he was discovered M I don't mild elevation of AST and ALTs and the terbinafine was stopped.predictably the pressure ulcer on his buttock looks betterusing silver alginate. The area on the left lateral leg also is better using Hydrofera Blue. The area between the first and second toes on the left better. First and second toes on the right still substantial but better. Finally the DTI on the left heel  has held together and looks like it's resolving 02/07/18-he is here in follow-up evaluation for multiple ulcerations. He has new injury to the lateral aspect of the last issue a pressure ulcer, he states this is from adhesive removal trauma. He states he has tried multiple adhesive products with no success. All other ulcers appear stable. The left heel DTI is resolving. We will continue with same treatment plan and follow-up next week. 02/14/18; follow-up for multiple areas. He has a new area last week on the lateral aspect of his pressure ulcer more over the posterior trochanter. The original pressure ulcer looks quite stable has healthy granulation. 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 Almost fully epithelialized on the original superior area using Hydrofera  Blue DTI on the left heel has peeled off this week to reveal a small superficial wound under denuded skin and subcutaneous tissue Both areas between the first and second toes look better including nothing open on the left 02/21/18; The patient's wounds on his left ischial tuberosity and posterior left greater trochanter actually looked better. He has a large area of irritation around the area which I think is contact dermatitis. I am doubtful that this is fungal His original wound on the left lateral calf continues to improve we have been using Hydrofera Blue There is no open area in the left first second toe web space although there is a lot of thick callus The DTI on the left heel required debridement today of necrotic surface eschar and subcutaneous tissue using silver alginate Finally the area on the right first second toe webspace continues to contract using silver alginate and ketoconazole 02/28/18 Left ischial tuberosity wounds look better using silver alginate. Original wound on the left calf only has one small open area left using Hydrofera Blue DTI on the left heel required debridement mostly removing skin from around this wound surface. Using silver alginate The areas on the right first/second toe web space using silver alginate and ketoconazole 03/08/18 on evaluation today patient appears to be doing decently well as best I can tell in regard to his wounds. This is the first time that I have seen him as he generally is followed by Dr. Dellia Nims. With that being said none of his wounds appear to be infected he does have an area where there is some skin covering what appears to be a new wound on the left dorsal surface of his great toe. This is right at the nail bed. With that being said I do believe that debrided away some of the excess skin can be of benefit in this regard. Otherwise he has been tolerating the dressing changes without complication. 03/14/18; patient arrives today with the  multiplicity of wounds that we are following. He has not been systemically unwell Original wound on the left lateral calf now only has 2 small open areas we've been using Hydrofera Blue which should continue The deep tissue injury on the left heel requires debridement today. We've been using silver alginate The left first second toe and the right first second toe are both are reminiscence what I think was tinea pedis. Apparently some of the callus Surface between the toes was removed last week when it started draining. Purulent drainage coming from the wound on the ischial tuberosity on the left. 03/21/18-He is here in follow-up evaluation for multiple wounds. There is improvement, he is currently taking doxycycline, culture obtained last  week grew tetracycline sensitive MRSA. He tolerated debridement. The only change to last week's recommendations is to discontinue antifungal cream between toes. He will follow-up next week 03/28/18; following up for multiple wounds;Concern this week is streaking redness and swelling in the right foot. He is going to need antibiotics for this. 03/31/18; follow-up for right foot cellulitis. Streaking redness and swelling in the right foot on 03/28/18. 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 I put him on clindamycin 300 mg every 6 and brought him in for a quick check. He has an open wound between his first and second toes on the right foot as a potential source. 04/04/18; Right foot cellulitis is resolving he is completing clindamycin. This is truly good news Left lateral calf wound which is initial wound only has one small open area inferiorly this is close to healing out. He has compression stockings. We will use Hydrofera Blue right down to the epithelialization of this Nonviable surface on the left heel which was initially pressure with a DTI. We've been using Hydrofera Blue. I'm going to switch this back to silver alginate Left first second  toe/tinea pedis this looks better using silver alginate Right first second toe tinea pedis using silver alginate Large pressure ulcers on theLeft ischial tuberosity. Small wound here Looks better. I am uncertain about the surface over the large wound. Using silver alginate 04/11/18; Cellulitis in the right foot is resolved Left lateral calf wound which was his original wounds still has 2 tiny open areas remaining this is just about closed Nonviable surface on the left heel is better but still requires debridement Left first second toe/tinea pedis still open using silver alginate Right first second toe wound tinea pedis I asked him to go back to using ketoconazole and silver alginate Large pressure ulcers on the left ischial tuberosity this shear injury here is resolved. Wound is smaller. No evidence of infection using silver alginate 04/18/18; Patient arrives with an intense area of cellulitis in the right mid lower calf extending into the right heel area. Bright red and warm. Smaller area on the left anterior leg. He has a significant history of MRSA. He will definitely need antibioticsdoxycycline He now has 2 open areas on the left ischial tuberosity the original large wound and now a satellite area which I think was above his initial satellite areas. Not a wonderful surface on this satellite area surrounding erythema which looks like pressure related. His left lateral calf wound again his original wound is just about closed Left heel pressure injury still requiring debridement Left first second toe looks a lot better using silver alginate Right first second toe also using silver alginate and ketoconazole cream also looks better 04/20/18; the patient was worked in early today out of concerns with his cellulitis on the right leg. I had started him on doxycycline. This was 2 days ago. His wife was concerned about the swelling in the area. Also concerned about the left buttock. He has not been  systemically unwell no fever chills. No nausea vomiting or diarrhea 04/25/18; the patient's left buttock wound is continued to deteriorate he is using Hydrofera Blue. He is still completing clindamycin for the cellulitis on the right leg although all of this looks better. 05/02/18 Left buttock wound still with a lot of drainage and a very tightly adherent fibrinous necrotic surface. He has a deeper area superiorly The left lateral calf wound is still closed DTI wound on the left heel necrotic surface especially  the circumference using Iodoflex Areas between his left first second toe and right first second toe both look better. Dorsally and the right first second toe he had a necrotic surface although at smaller. In using silver alginate and ketoconazole. I did a culture last week which was a deep tissue culture of the reminiscence of the open wound on the right first second toe dorsally. This grew a few Acinetobacter and a few methicillin-resistant staph aureus. Nevertheless the area actually this week looked better. I didn't feel the need to specifically address this at least in terms of systemic antibiotics. 05/09/18; wounds are measuring larger more drainage per our intake. We are using Santyl covered with alginate on the large superficial buttock wounds, Iodosorb on the left heel, ketoconazole and silver alginate to the dorsal first and second toes bilaterally. 05/16/18; The area on his left buttock better in some aspects although the area superiorly over the ischial tuberosity required an extensive debridement.using Santyl Left heel appears stable. Using Iodoflex The areas between his first and second toes are not bad however there is spreading erythema up the dorsal aspect of his left foot this looks like cellulitis again. He is insensate the erythema is really very brilliant.o Erysipelas He went to see an allergist days ago because he was itching part of this he had lab work done. This  showed a white count of 15.1 with 70% neutrophils. Hemoglobin of 11.4 and a platelet count of 659,000. Last white count we had in [facility] was a 2-1/2 years ago which was 25.9 but he was ill at the time. He was able to show me some lab work that was done by his primary physician the pattern is about the same. 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 But prompted me to go ahead and do x-rays of both feet and the pelvis rule out osteomyelitis. He also had a comprehensive metabolic panel this was reasonably normal his albumin was 3.7 liver function tests BUN/creatinine all normal 05/23/18; x-rays of both his feet from last week were negative for underlying pulmonary abnormality. The x-ray of his pelvis however showed mild irregularity in the left ischial which may represent some early osteomyelitis. The wound in the left ischial continues to get deeper clearly now exposed muscle. Each week necrotic surface material over this area. Whereas the rest of the wounds do not look so bad. The left ischial wound we have been using Santyl and calcium alginate To the left heel surface necrotic debris using Iodoflex The left lateral leg is still healed Areas on the left dorsal foot and the right dorsal foot are about the same. There is some inflammation on the left which might represent contact dermatitis, fungal dermatitis I am doubtful cellulitis although this looks better than last week 05/30/18; CT scan done at Hospital did not show any osteomyelitis or abscess. Suggested the possibility of underlying cellulitis although I don't see a lot of evidence of this at the bedside The wound itself on the left buttock/upper thigh actually looks somewhat better. No debridement Left heel also looks better no debridement continue Iodoflex Both dorsal first second toe spaces appear better using Lotrisone. Left still required debridement 06/06/18; Intake reported some purulent  looking drainage from the left gluteal wound. Using Santyl and calcium alginate Left heel looks better although still a nonviable surface requiring debridement The left dorsal foot first/second webspace actually expanding and somewhat deeper. I may consider doing a shave biopsy  of this area Right dorsal foot first/second webspace appears stable to improved. Using Lotrisone and silver alginate to both these areas 06/13/18 Left gluteal surface looks better. Now separated in the 2 wounds. No debridement required. Still drainage. We'll continue silver alginate Left heel continues to look better with Iodoflex continue this for at least another week Of his dorsal foot wounds the area on the left still has some depth although it looks better than last week. We've been using Lotrisone and silver alginate 06/20/18 Left gluteal continues to look better healthy tissue Left heel continues to look better healthy granulation wound is smaller. He is using Iodoflex and his long as this continues continue the Iodoflex Dorsal right foot looks better unfortunately dorsal left foot does not. There is swelling and erythema of his forefoot. He had minor trauma to this several days ago but doesn't think this was enough to have caused any tissue injury. Foot looks like cellulitis, we have had this problem before 06/27/18 on evaluation today patient appears to be doing a little worse in regard to his foot ulcer. 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 Both of which would really be his only options for treating this infection. In the past he has been given and effusion of Orbactiv. This is done very well for him in the past again it's one time dosing IV antibiotic therapy. Subsequently I do believe this is something we're gonna need to see about doing at this point in time.  Currently his other wounds seem to be doing somewhat better in my pinion I'm pretty happy in that regard. 07/03/18 on evaluation today patient's wounds actually appear to be doing fairly well. He has been tolerating the dressing changes without complication. All in all he seems to be showing signs of improvement. In regard to the antibiotics he has been dealing with infectious disease since I saw him last week as far as getting this scheduled. In the end [facility]'s [facility] this coming Friday. In the meantime he has been continuing to perform the dressing changes in such as previous. There does not appear to be any evidence of infection worsengin at this time. 07/10/18; Since I last saw this man 2 weeks ago things have actually improved. IV antibiotics of resulted in less forefoot erythema although there is still some present. He is not systemically unwell Left buttock wounds 2 now have no depth there is increased epithelialization Using silver alginate Left heel still requires debridement using Iodoflex Left dorsal foot still with a sizable wound about the size of a border but healthy granulation Right dorsal foot still with a slitlike area using silver alginate 07/18/18; the patient's cellulitis in the left foot is improved in fact I think it is on its way to resolving. Left buttock wounds 2 both look better although the larger one has hypertension granulation we've been using silver alginate Left heel has some thick circumferential redundant skin over the wound edge which will need to be removed today we've been using Iodoflex Left dorsal foot is still a sizable wound required debridement using silver alginate The right dorsal foot is just about closed only a small open area remains here 07/25/18; left foot cellulitis is resolved Left buttock wounds 2 both look better. Hyper-granulation on the major area Left heel  as some debris over the  surface but otherwise looks a healthier wound. Using silver collagen Right dorsal foot is just about closed 07/31/18; arrives with our intake nurse worried about purulent drainage from the buttock. We had hyper-granulation here last week His buttock wounds 2 continue to look better Left heel some debris over the surface but measuring smaller. Right dorsal foot unfortunately has openings between the toes Left foot superficial wound looks less aggravated. 08/07/18 Buttock wounds continue to look better although some of her granulation and the larger medial wound. silver alginate Left heel continues to look a lot better.silver collagen Left foot superficial wound looks less stable. Requires debridement. He has a new wound superficial area on the foot on the lateral dorsal foot. Right foot looks better using silver alginate without Lotrisone 08/14/2018; patient was in the ER last week diagnosed with a UTI. He is now on Cefpodoxime and Macrodantin. Buttock wounds continued to be smaller. Using silver alginate Left heel continues to look better using silver collagen Left foot superficial wound looks as though it is improving Right dorsal foot area is just about healed. 08/21/2018; patient is completed his antibiotics for his UTI. He has 2 open areas on the buttocks. There is still not closed although the surface looks satisfactory. Using silver alginate Left heel continues to improve using silver collagen The bilateral dorsal foot areas which are at the base of his first and second toes/possible tinea pedis are actually stable on the left but worse on the right. The area on the left required debridement of necrotic surface. After debridement I obtained a specimen for PCR culture. The right dorsal foot which is been just about healed last week is now reopened 08/28/2018; culture done on the left dorsal foot showed coag negative staph both staph epidermidis and Lugdunensis. I think this is worthwhile  initiating systemic treatment. I will use doxycycline given his long list of allergies. The area on the left heel slightly improved but still requiring debridement. The large wound on the buttock is just about closed whereas the smaller one is larger. Using silver alginate in this area 09/04/2018; patient is completing his doxycycline for the left foot although this continues to be a very difficult wound area with very adherent necrotic debris. We are using silver alginate to all his wounds right foot left foot and the small wounds on his buttock, silver collagen on the left heel. 09/11/2018; once again this patient has intense erythema and swelling of the left forefoot. Lesser degrees of erythema in the right foot. He has a long list of allergies and intolerances. I will reinstitute doxycycline. 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 Using silver alginate Left heel also looks better using silver collagen Unfortunately both the areas on his feet look worse. The area on the left first second webspace is now gone through to the plantar part of his foot. The area on the left foot anteriorly is irritated with erythema and swelling in the forefoot. 09/25/2018 His wound on the left plantar heel looks better. Using silver collagen The area on the left buttock 2 small remnant areas. One is closed one is still open. Using silver alginate The areas between both his first and second toes look worse. This in spite of long-standing antifungal therapy with ketoconazole and silver alginate which should have antifungal activity He has small areas around his original wound on the left calf one is on the bottom  of the original scar tissue and one superiorly both of these are small and superficial but again given wound history in this site this is worrisome 10/02/2018 Left plantar heel continues to gradually contract using silver collagen Left buttock wound is  unchanged using silver alginate The areas on his dorsal feet between his first and second toes bilaterally look about the same. I prescribed clindamycin ointment to see if we can address chronic staph colonization and also the underlying possibility of erythrasma The left lateral lower extremity wound is actually on the lateral part of his ankle. Small open area here. We have been using silver alginate 10/09/2018; Left plantar heel continues to look healthy and contract. No debridement is required Left buttock slightly smaller with a tape injury wound just below which was new this week Dorsal feet somewhat improved I have been using clindamycin Left lateral looks lower extremity the actual open area looks worse although a lot of this is epithelialized. I am going to change to silver collagen today He has a lot more swelling in the right leg although this is not pitting not red and not particularly warm there is a lot of spasm in the right leg usually indicative of people with paralysis of some underlying discomfort. We have reviewed his vascular status from 2017 he had a left greater saphenous vein ablation. I wonder about referring him back to vascular surgery if the area on the left leg continues to deteriorate. 10/16/2018 in today for follow-up and management of multiple lower extremity ulcers. His left Buttock wound is much lower smaller and almost closed completely. The wound to the left ankle has began to reopen with Epithelialization and some adherent slough. He has multiple new areas to the left foot and leg. The left dorsal foot without much improvement. Wound present between left great webspace and 2nd toe. Erythema and edema present right leg. Right LE ultrasound obtained on 10/10/18 was negative for DVT. 10/23/2018; Left buttock is closed over. Still dry macerated skin but there is no open wound. I suspect this is chronic pressure/moisture Left lateral calf is quite a bit worse  than when I saw this last. There is clearly drainage here he has macerated skin into the left plantar heel. We will change the primary dressing to alginate Left dorsal foot has some improvement in overall wound area. Still using clindamycin and silver alginate Right dorsal foot about the same as the left using clindamycin and silver alginate The erythema in the right leg has resolved. He is DVT rule out was negative Left heel pressure area required debridement although the wound is smaller and the surface is health 10/26/2018 The patient came back in for his nurse check today predominantly because of the drainage coming out of the left lateral leg with a recent reopening of his original wound on the left lateral calf. He comes in today with a large amount of surrounding erythema around the wound extending from the calf into the ankle and even in the area on the dorsal foot. He is not systemically unwell. He is not febrile. Nevertheless this looks like cellulitis. We have been using silver alginate to the area. I changed him to a regular visit and I am going to prescribe him doxycycline. The rationale here is a long list of medication intolerances and a history of MRSA. I did not see anything that I thought would provide a valuable culture 10/30/2018 Follow-up from his appointment 4 days ago with really an extensive area of cellulitis  in the left calf left lateral ankle and left dorsal foot. I put him on doxycycline. He has a long list of medication allergies which are true allergy reactions. 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 In any case he is a lot better today. The erythema especially in the anterior and lateral left calf is better. He still has left ankle erythema. He also is complaining about increasing edema in the right leg we have only been using Kerlix Coban and he has been doing the wraps at home. Finally he has  a spotty rash on the medial part of his upper left calf which looks like folliculitis or perhaps wrap occlusion type injury. Small superficial macules not pustules 11/06/18 patient arrives today with again a considerable degree of erythema around the wound on the left lateral calf extending into the dorsal ankle and dorsal foot. This is a lot worse than when I saw this last week. He is on doxycycline really with not a lot of improvement. He has not been systemically unwell Wounds on the; left heel actually looks improved. Original area on the left foot and proximity to the first and second toes looks about the same. He has superficial areas on the dorsal foot, anterior calf and then the reopening of his original wound on the left lateral calf which looks about the same The only area he has on the right is the dorsal webspace first and second which is smaller. He has a large area of dry erythematous skin on the left buttock small open area here. 11/13/2018; the patient arrives in much better condition. The erythema around the wound on the left lateral calf is a lot better. Not sure whether this was the clindamycin or the TCA and ketoconazole or just in the improvement in edema control [stasis dermatitis]. In any case this is a lot better. The area on the left heel is very small and just about resolved using silver collagen we have been using silver alginate to the areas on his dorsal feet 11/20/2018; his wounds include the left lateral calf, left heel, dorsal aspects of both feet just proximal to the first second webspace. He is stable to slightly improved. I did not think any changes to his dressings were going to be necessary 11/27/2018 he has a reopening on the left buttock which is surrounded by what looks like tinea or perhaps some other form of dermatitis. The area on the left dorsal foot has some erythema around it I have marked this area but I am not sure whether this is cellulitis or not.  Left heel is not closed. Left calf the reopening is really slightly longer and probably worse 1/13; in general things look better and smaller except for the left dorsal foot. Area on the left heel is just about closed, left buttock looks better only a small wound remains in the skin looks better [using Lotrisone] 1/20; the area on the left heel only has a few remaining open areas here. Left lateral calf about the same in terms of size, left dorsal foot slightly larger right lateral foot still not closed. The area on the left buttock has no open wound and the surrounding skin looks a lot better 1/27; the area on the left heel is closed. Left lateral calf better but still requiring extensive debridements. The area on his left buttock is closed. He still has the open areas on the left dorsal foot which  is slightly smaller in the right foot which is slightly expanded. We have been using Iodoflex on these areas as well 2/3; left heel is closed. Left lateral calf still requiring debridement using Iodoflex there is no open area on his left buttock however he has dry scaly skin over a large area of this. Not really responding well to the Lotrisone. Finally the areas on his dorsal feet at the level of the first second webspace are slightly smaller on the right and about the same on the left. Both of these vigorously debrided with Anasept and gauze 2/10; left heel remains closed he has dry erythematous skin over the left buttock but there is no open wound here. Left lateral leg has come in and with. Still requiring debridement we have been using Iodoflex here. Finally the area on the left dorsal foot and right dorsal foot are really about the same extremely dry callused fissured areas. He does not yet have a dermatology appointment 2/17; left heel remains closed. He has a new open area on the left buttock. The area on the left lateral calf is bigger longer and still covered in necrotic debris. No major  change in his foot areas bilaterally. I am awaiting for a dermatologist to look on this. We have been using ketoconazole I do not know that this is been doing any good at all. 2/24; left heel remains closed. The left buttock wound that was new reopening last week looks better. The left lateral calf appears better also although still requires debridement. The major area on his foot is the left first second also requiring debridement. We have been putting Prisma on all wounds. I do not believe that the ketoconazole has done too much good for his feet. He will use Lotrisone I am going to give him a 2-week course of terbinafine. We still do not have a dermatology appointment 3/2 left heel remains closed however there is skin over bone in this area I pointed this out to him today. The left buttock wound is epithelialized but still does not look completely stable. The area on the left leg required debridement were using silver collagen here. With regards to his feet we changed to Lotrisone last week and silver alginate. 3/9; left heel remains closed. Left buttock remains closed. The area on the right foot is essentially closed. The left foot remains unchanged. Slightly smaller on the left lateral calf. Using silver collagen to both of these areas 3/16-Left heel remains closed. Area on right foot is closed. Left lateral calf above the lateral malleolus open wound requiring debridement with easy bleeding. Left dorsal wound proximal to first toe also debrided. Left ischial area open new. Patient has been using Prisma with wrapping every 3 days. Dermatology appointment is apparently tomorrow.Patient has completed his terbinafine 2-week course with some apparent improvement according to him, there is still flaking and dry skin in his foot on the left 3/23; area on the right foot is reopened. The area on the left anterior foot is about the same still a very necrotic adherent surface. He still has the area on  the left leg and reopening is on the left buttock. He apparently saw dermatology although I do not have a note. According to the patient who is usually fairly well informed they did not have any good ideas. Put him on oral terbinafine which she is been on before. 3/30; using silver collagen to all wounds. Apparently his dermatologist put him on doxycycline and rifampin presumably some  culture grew staph. I do not have this result. He remains on terbinafine although I have used terbinafine on him before 4/6; patient has had a fairly substantial reopening on the right foot between the first and second toes. He is finished his terbinafine and I believe is on doxycycline and rifampin still as prescribed by dermatology. We have been using silver collagen to all his wounds although the patient reports that he thinks silver alginate does better on the wounds on his buttock. 4/13; the area on his left lateral calf about the same size but it did not require debridement. Left dorsal foot just proximal to the webspace between the first and second toes is about the same. Still nonviable surface. I note some superficial bronze discoloration of the dorsal part of his foot Right dorsal foot just proximal to the first and second toes also looks about the same. I still think there may be the same discoloration I noted above on the left Left buttock wound looks about the same 4/20; left lateral calf appears to be gradually contracting using silver collagen. 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 The left dorsal foot wound is debrided of tightly adherent necrotic debris and really cleans up quite [name]. The right area is worse with expansion. I did not debride this it is now over the base of the second toe The area on his left buttock is smaller no debridement is required using silver collagen 5/4; left calf continues to make good progress. He arrives  with erythema around the wounds on his dorsal foot which even extends to the plantar aspect. Very concerning for coexistent infection. He is finished the erythromycin I gave him for possible erythrasma this does not seem to have helped. The area on the left foot is about the same base of the dorsal toes Is area on the buttock looks improved on the left 5/11; left calf and left buttock continued to make good progress. Left foot is about the same to slightly improved. Major problem is on the right foot. He has not had an x-ray. Deep tissue culture I did last week showed both Enterobacter and E. coli. I did not change the doxycycline I put him on empirically although neither 1 of these were plated to doxycycline. He arrives today with the erythema looking worse on both the dorsal and plantar foot. Macerated skin on the bottom of the foot. he has not been systemically unwell 5/18-Patient returns at 1 week, left calf wound appears to be making some progress, left buttock wound appears slightly worse than last time, left foot wound looks slightly better, right foot redness is marginally better. X-ray of both feet show no air or evidence of osteomyelitis. Patient is finished his Omnicef and terbinafine. He continues to have macerated skin on the bottom of the left foot as well as right 5/26; left calf wound is better, left buttock wound appears to have multiple small superficial open areas with surrounding macerated skin. X-rays that I did last time showed no evidence of osteomyelitis in either foot. He is finished cefdinir and doxycycline. I do not think that he was on terbinafine. He continues to have a large superficial open area on the right foot anterior dorsal and slightly between the first and second toes. I did send him to dermatology 2 months ago or so wondering about whether they would do a fungal scraping. I do not believe they did but did do a culture. We have  been using silver alginate to  the toe areas, he has been using antifungals at home topically either ketoconazole or Lotrisone. We are using silver collagen on the left foot, silver alginate on the right, silver collagen on the left lateral leg and silver alginate on the left buttock 6/1; left buttock area is healed. We have the left dorsal foot, left lateral leg and right dorsal foot. We are using silver alginate to the areas on both feet and silver collagen to the area on his left lateral calf 6/8; the left buttock apparently reopened late last week. He is not really sure how this happened. He is tolerating the terbinafine. Using silver alginate to all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 leg. He is due for ablation surgery on May 22. He is much closer to being ready for an Apligraf. Has a new area between the left first and second toes 04/01/16 aggressive debridement done of both wounds. The new wound at the base of between his second and first toes looks stable 04/08/16; continued aggressive debridement of both wounds on the left lower leg. He goes for his venous ablation on Monday. The new wound at the  base of his first and second toes dorsally appears stable. 04/15/16; wounds aggressively debridement although the base of this looks considerably better Apligraf #1. He had ablation surgery on Monday I'll need to research these records. We only have approval for four Apligraf's 04/22/16; the patient is  here for a wound check [Apligraf last week] intake nurse concerned about erythema around the wounds. Apparently a significant degree of drainage. The patient has chronic venous inflammation which I think accounts for most of this however I was asked to look at this today 04/26/16; the patient came back for check of possible cellulitis in his left foot however the Apligraf dressing was inadvertently removed therefore we elected to prep the wound for a second Apligraf. I put him on doxycycline on 6/1 the erythema in the foot 05/03/16 we did not remove the dressing from the superior wound as this is where I put all of his last Apligraf. Surface debridement done with a curette of the lower wound which looks very healthy. The area on the left foot also looks quite satisfactory at the dorsal artery at the first and second toes 05/10/16; continue Apligraf to this. Her wound, Hydrafera to the lower wound. He has a new area on the right second toe. Left dorsal foot firstoosecond toe also looks improved 05/24/16; wound dimensions must be smaller I was able to use Apligraf to all 3 remaining wound areas. 06/07/16 patient's last Apligraf was 2 weeks ago. He arrives today with the 2 wounds on his lateral left leg joined together. This would have to be seen as a negative. He also has a small wound in his first and second toe on the left dorsally with quite a bit of surrounding erythema in the first second and third toes. This looks to be infected or inflamed, very difficult clinical call. 06/21/16: lateral left leg combined wounds. Adherent surface slough area on the left dorsal foot at roughly the fourth toe looks  improved 07/12/16; he now has a single linear wound on the lateral left leg. This does not look to be a lot changed from when I lost saw this. The area on his dorsal left foot looks considerably better however. 08/02/16; no major change in the substantial area on his left lateral leg since last time. We have been using Hydrofera Blue for a prolonged period of time now. The area on his left foot is also unchanged from last review 07/19/16; the area on his dorsal foot on the left looks considerably smaller. He is beginning to have significant rims of epithelialization on the lateral left leg wound. This also looks better. 08/05/16; the patient came in for a nurse visit today. Apparently the area on his left lateral leg looks better and it was wrapped. However in general discussion the patient noted a new area on the dorsal aspect of his right second toe. The exact etiology of this is unclear but likely relates to pressure. 08/09/16 really the area on the left lateral leg did not really look that healthy today perhaps slightly larger and measurements. The area on his dorsal right second toe is improved also the left foot wound looks stable to improved 08/16/16; the area on the last lateral leg did not change any of dimensions. Post debridement with a curet the area looked better. Left foot wound improved and the area on the dorsal right second toe is improved 08/23/16; the area on the left lateral leg may be slightly smaller both in terms of length and width. Aggressive debridement with a curette afterwards the tissue appears healthier. Left foot wound appears improved in the area on the dorsal right second toe is improved 08/30/16 patient developed a fever over the weekend and was seen in an urgent care. Felt to have a UTI and put  on doxycycline. He has been since changed over the phone to [facility]. After we took off the wrap on his right leg today the leg is swollen warm and erythematous, probably more  likely the source of the fever 09/06/16; have been using collagen to the major left leg wound, silver alginate to the area on his anterior foot/toes 09/13/16; the areas on his anterior foot/toes on both sides appear to be virtually closed. Extensive wound on the left lateral leg perhaps slightly narrower but each visit still covered an adherent surface slough 09/16/16 patient was in for his usual Thursday nurse visit however the intake nurse noted significant erythema of his dorsal right foot. He is also running a low-grade fever and having increasing spasms in the right leg 09/20/16 here for cellulitis involving his right great toes and forefoot. This is a lot better. Still requiring debridement on his left lateral leg. Santyl direct says he needs prior authorization. Therefore his wife cannot change this at home 09/30/16; the patient's extensive area on the left lateral calf and ankle perhaps somewhat better. Using Santyl. The area on the left toes is healed and I think the area on his right dorsal foot is healed as well. There is no cellulitis or venous inflammation involving the right leg. He is going to need compression stockings here. 10/07/16; the patient's extensive wound on the left lateral calf and ankle does not measure any differently however there appears to be less adherent surface slough using Santyl and aggressive weekly debridements 10/21/16; no major change in the area on the left lateral calf. Still the same measurement still very difficult to debridement adherent slough and nonviable subcutaneous tissue. This is not really been helped by several weeks of Santyl. Previously for 2 weeks I used Iodoflex for a short period. A prolonged course of Hydrofera Blue didn't really help. I'm not sure why I only used 2 weeks of Iodoflex on this there is no evidence of surrounding infection. He has a small area on the right second toe which looks as though it's progressing towards  closure 10/28/16; the wounds on his toes appear to be closed. No major change in the left lateral leg wound although the surface looks somewhat better using Iodoflex. He has had previous arterial studies that were normal. He has had reflux studies and is status post ablation although I don't have any exact notes on which vein was ablated. I'll need to check the surgical record 11/04/16; he's had a reopening between the first and second toe on the left and right. No major change in the left lateral leg wound. There is what appears to be cellulitis of the left dorsal foot 11/18/16 the patient was hospitalized initially in [place] and then subsequently transferred to [facility] long and was admitted there from 11/09/16 through 11/12/16. He had developed progressive cellulitis on the right leg in spite of the doxycycline I gave him. I'd spoken to the hospitalist in [place] who was concerned about continuing leukocytosis. CT scan is what I suggested this was done which showed soft tissue swelling without evidence of osteomyelitis or an underlying abscess blood cultures were negative. At [facility] he was treated with vancomycin and Primaxin and then add an infectious disease consult. He was transitioned to Ceftaroline. He has been making progressive improvement. Overall a severe cellulitis of the right leg. He is been using silver alginate to her original wound on the left leg. The wounds in his toes on the right are closed there is a  small open area on the base of the left second toe 11/26/15; the patient's right leg is much better although there is still some edema here this could be reminiscent from his severe cellulitis likely on top of some degree of lymphedema. His left anterior leg wound has less surface slough as reported by her intake nurse. Small wound at the base of the left second toe 12/02/16; patient's right leg is better and there is no open wound here. His left anterior lateral leg wound  continues to have a healthy-looking surface. Small wound at the base of the left second toe however there is erythema in the left forefoot which is worrisome 12/16/16; is no open wounds on his right leg. We took measurements for stockings. His left anterior lateral leg wound continues to have a healthy-looking surface. I'm not sure where we were with the Apligraf run through his insurance. We have been using Iodoflex. He has a thick eschar on the left first second toe interface, I suspect this may be fungal however there is no visible open 12/23/16; no open wound on his right leg. He has 2 small areas left of the linear wound that was remaining last week. We have been using Prisma, I thought I have disclosed this week, we can only look forward to next week 01/03/17; the patient had concerning areas of erythema last week, already on doxycycline for UTI through his primary doctor. The erythema is absolutely no better there is warmth and swelling both medially from the left lateral leg wound and also the dorsal left foot. 01/06/17- Patient is here for follow-up evaluation of his left lateral leg ulcer and bilateral feet ulcers. He is on oral antibiotic therapy, tolerating that. Nursing staff and the patient states that the erythema is improved from Monday. 01/13/17; the predominant left lateral leg wound continues to be problematic. I had put Apligraf on him earlier this month once. However he subsequently developed what appeared to be an intense cellulitis around the left lateral leg wound. I gave him Dalvance I think on 2/12 perhaps 2/13 he continues on cefdinir. The erythema is still present but the warmth and swelling is improved. I am hopeful that the cellulitis part of this control. I wouldn't be surprised if there is an element of venous inflammation as well. 01/17/17. The erythema is present but better in the left leg. His left lateral leg wound still does not have a viable surface buttons certain  parts of this long thin wound it appears like there has been improvement in dimensions. 01/20/17; the erythema still present but much better in the left leg. I'm thinking this is his usual degree of chronic venous inflammation. The wound on the left leg looks somewhat better. Is less surface slough 01/27/17; erythema is back to the chronic venous inflammation. The wound on the left leg is somewhat better. I am back to the point where I like to try an Apligraf once again 02/10/17; slight improvement in wound dimensions. Apligraf #2. He is completing his doxycycline 02/14/17; patient arrives today having completed doxycycline last Thursday. This was supposed to be a nurse visit however once again he hasn't tense erythema from the medial part of his wound extending over the lower leg. Also erythema in his foot this is roughly in the same distribution as last time. He has baseline chronic venous inflammation however this is a lot worse than the baseline I have learned to accept the on him is baseline inflammation 02/24/17- patient is here for  follow-up evaluation. He is tolerating compression therapy. His voicing no complaints or concerns he is here anticipating an Apligraf 03/03/17; he arrives today with an adherent necrotic surface. I don't think this is surface is going to be amenable for Apligraf's. The erythema around his wound and on the left dorsal foot has resolved he is off antibiotics 03/10/17; better-looking surface today. I don't think he can tolerate Apligraf's. He tells me he had a wound VAC after a skin graft years ago to this area and they had difficulty with a seal. The erythema continues to be stable around this some degree of chronic venous inflammation but he also has recurrent cellulitis. We have been using Iodoflex 03/17/17; continued improvement in the surface and may be small changes in dimensions. Using Iodoflex which seems the only thing that will control his surface 03/24/17- He is here  for follow up evaluation of his LLE lateral ulceration and ulcer to right dorsal foot/toe space. He is voicing no complaints or concerns, He is tolerating compression wrap. 03/31/17 arrives today with a much healthier looking wound on the left lower extremity. We have been using Iodoflex for a prolonged period of time which has for the first time prepared and adequate looking wound bed although we have not had much in the way of wound dimension improvement. He also has a small wound between the first and second toe on the right 04/07/17; arrives today with a healthy-looking wound bed and at least the top 50% of this wound appears to be now her. No debridement was required I have changed him to [facility] last week after prolonged Iodoflex. He did not do well with Apligraf's. We've had a re-opening between the first and second toe on the right 04/14/17; arrives today with a healthier looking wound bed contractions and the top 50% of this wound and some on the lesser 50%. Wound bed appears healthy. The area between the first and second toe on the right still remains problematic 04/21/17; continued very gradual improvement. Using [facility] 04/28/17; continued very gradual improvement in the left lateral leg venous insufficiency wound. His periwound erythema is very mild. We have been using Hydrofera Blue. Wound is making progress especially in the superior 50% 05/05/17; he continues to have very gradual improvement in the left lateral venous insufficiency wound. Both in terms with an length rings are improving. I debrided this every 2 weeks with #5 curet and we have been using Hydrofera Blue and again making good progress With regards to the wounds between his right first and second toe which I thought might of been tinea pedis he is not making as much progress very dry scaly skin over the area. Also the area at the base of the left first and second toe in a similar condition 05/12/17; continued  gradual improvement in the refractory left lateral venous insufficiency wound on the left. Dimension smaller. Surface still requiring debridement using Hydrofera Blue 05/19/17; continued gradual improvement in the refractory left lateral venous ulceration. Careful inspection of the wound bed underlying rumination suggested some degree of epithelialization over the surface no debridement indicated. Continue Hydrofera Blue difficult areas between his toes first and third on the left than first and second on the right. I'm going to change to silver alginate from silver collagen. Continue ketoconazole as I suspect underlying tinea pedis 05/26/17; left lateral leg venous insufficiency wound. We've been using Hydrofera Blue. I believe that there is expanding epithelialization over the surface of the wound albeit not coming  from the wound circumference. This is a bit of an odd situation in which the epithelialization seems to be coming from the surface of the wound rather than in the exact circumference. There is still small open areas mostly along the lateral margin of the wound. ooHe has unchanged areas between the left first and second and the right first second toes which I been treating for tenia pedis 06/02/17; left lateral leg venous insufficiency wound. We have been using Hydrofera Blue. Somewhat smaller from the wound circumference. The surface of the wound remains a bit on it almost epithelialized sedation in appearance. I use an open curette today debridement in the surface of all of this especially the edges ooSmall open wounds remaining on the dorsal right first and second toe interspace and the plantar left first second toe and her face on the left 06/09/17; wound on the left lateral leg continues to be smaller but very gradual and very dry surface using Hydrofera Blue 06/16/17 requires weekly debridements now on the left lateral leg although this continues to contract. I changed to silver  collagen last week because of dryness of the wound bed. Using Iodoflex to the areas on his first and second toes/web space bilaterally 06/24/17; patient with history of paraplegia also chronic venous insufficiency with lymphedema. Has a very difficult wound on the left lateral leg. This has been gradually reducing in terms of with but comes in with a very dry adherent surface. High switch to silver collagen a week or so ago with hydrogel to keep the area moist. This is been refractory to multiple dressing attempts. He also has areas in his first and second toes bilaterally in the anterior and posterior web space. I had been using Iodoflex here after a prolonged course of silver alginate with ketoconazole was ineffective [question tinea pedis] 07/14/17; patient arrives today with a very difficult adherent material over his left lateral lower leg wound. He also has surrounding erythema and poorly controlled edema. He was switched his Santyl last visit which the nurses are applying once during his doctor visit and once on a nurse visit. He was also reduced to 2 layer compression I'm not exactly sure of the issue here. 07/21/17; better surface today after 1 week of Iodoflex. Significant cellulitis that we treated last week also better. [Doxycycline] 07/28/17 better surface today with now 2 weeks of Iodoflex. Significant cellulitis treated with doxycycline. He has now completed the doxycycline and he is back to his usual degree of chronic venous inflammation/stasis dermatitis. He reminds me he has had ablations surgery here 08/04/17; continued improvement with Iodoflex to the left lateral leg wound in terms of the surface of the wound although the dimensions are better. 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 Problematic areas on the plantar aspect of the first second toe web space on the left and the dorsal aspect of the first second  toe web space on the right. At one point I felt these were probably related to chronic fungal infections in treated him aggressively for this although we have not made any improvement here. 08/11/17; left lateral leg. Surface continues to improve with the Iodoflex although we are not seeing much improvement in overall wound dimensions. Areas on his plantar left foot and right foot show no improvement. In fact the right foot looks somewhat worse 08/18/17; left lateral leg. We changed to [facility] Blue last week after a prolonged course of Iodoflex which helps  get the surface better. It appears that the wound with is improved. Continue with difficult areas on the left dorsal first second and plantar first second on the right 09/01/17; patient arrives in clinic today having had a temperature of 103 yesterday. He was seen in the ER and [facility]. The patient was concerned he could have cellulitis again in the right leg however they diagnosed him with a UTI and he is now on Keflex. He has a history of cellulitis which is been recurrent and difficult but this is been in the left leg, in the past 5 use doxycycline. He does in and out catheterizations at home which are risk factors for UTI 09/08/17; patient will be completing his Keflex this weekend. The erythema on the left leg is considerably better. He has a new wound today on the medial part of the right leg small superficial almost looks like a skin tear. He has worsening of the area on the right dorsal first and second toe. His major area on the left lateral leg is better. Using Hydrofera Blue on all areas 09/15/17; gradual reduction in width on the long wound in the left lateral leg. No debridement required. He also has wounds on the plantar aspect of his left first second toe web space and on the dorsal aspect of the right first second toe web space. 09/22/17; there continues to be very gradual improvements in the dimensions of the left lateral leg  wound. He hasn't round erythematous spot with might be pressure on his wheelchair. There is no evidence obviously of infection no purulence no warmth ooHe has a dry scaled area on the plantar aspect of the left first second toe ooImproved area on the dorsal right first second toe. 09/29/17; left lateral leg wound continues to improve in dimensions mostly with an is still a fairly long but increasingly narrow wound. ooHe has a dry scaled area on the plantar aspect of his left first second toe web space ooIncreasingly concerning area on the dorsal right first second toe. In fact I am concerned today about possible cellulitis around this wound. The areas extending up his second toe and although there is deformities here almost appears to abut on the nailbed. 10/06/17; left lateral leg wound continues to make very gradual progress. Tissue culture I did from the right first second toe dorsal foot last time grew MRSA and enterococcus which was vancomycin sensitive. This was not sensitive to clindamycin or doxycycline. He is allergic to Zyvox and sulfa we have therefore arrange for him to have dalvance infusion tomorrow. He is had this in the past and tolerated it well 10/20/17; left lateral leg wound continues to make decent progress. This is certainly reduced in terms of with there is advancing epithelialization.ooThe cellulitis in the right foot looks better although he still has a deep wound in the dorsal aspect of the first second toe web space. Plantar left first toe web space on the left I think is making some progress 10/27/17; left lateral leg wound continues to make decent progress. Advancing epithelialization.using Hydrofera Blue ooThe right first second toe web space wound is better-looking using silver alginate ooImprovement in the left plantar first second toe web space. Again using silver alginate 11/03/17 left lateral leg wound continues to make decent progress albeit slowly.  Using Hydrofera Blue ooThe right per second toe web space continues to be a very problematic looking punched out wound. I obtained a piece of tissue for deep culture I did extensively treated this  for fungus. It is difficult to imagine that this is a pressure area as the patient states other than going outside he doesn't really wear shoes at home ooThe left plantar first second toe web space looked fairly senescent. Necrotic edges. This required debridement oochange to Hydrofera Blue to all wound areas 11/10/17; left lateral leg wound continues to contract. Using Hydrofera Blue ooOn the right dorsal first second toe web space dorsally. Culture I did of this area last week grew MRSA there is not an easy oral option in this patient was multiple antibiotic allergies or intolerances. This was only a rare culture isolate I'm therefore going to use Bactroban under silver alginate ooOn the left plantar first second toe web space. Debridement is required here. This is also unchanged 11/17/17; left lateral leg wound continues to contract using Hydrofera Blue this is no longer the major issue. ooThe major concern here is the right first second toe web space. He now has an open area going from dorsally to the plantar aspect. There is now wound on the inner lateral part of the first toe. Not a very viable surface on this. There is erythema spreading medially into the forefoot. ooNo major change in the left first second toe plantar wound 11/24/17; left lateral leg wound continues to contract using Hydrofera Blue. Nice improvement today ooThe right first second toe web space all of this looks a lot less angry than last week. I have given him clindamycin and topical Bactroban for MRSA and terbinafine for the possibility of underlining tinea pedis that I could not control with ketoconazole. Looks somewhat better ooThe area on the plantar left first second toe web space is weeping with dried debris around  the wound 12/01/17; left lateral leg wound continues to contract he Hydrofera Blue. It is becoming thinner in terms of with nevertheless it is making good improvement. ooThe right first second toe web space looks less angry but still a large necrotic-looking wounds starting on the plantar aspect of the right foot extending between the toes and now extensively on the base of the right second toe. I gave him clindamycin and topical Bactroban for MRSA anterior benefiting for the possibility of underlying tinea pedis. Not looking better today ooThe area on the left first/second toe looks better. Debrided of necrotic debris 12/05/17* the patient was worked in urgently today because over the weekend he found blood on his incontinence bad when he woke up. He was found to have an ulcer by his wife who does most of his wound care. He came in today for [place] to look at this. He has not had a history of wounds in his buttocks in spite of his paraplegia. 12/08/17; seen in follow-up today at his usual appointment. He was seen earlier this week and found to have a new wound on his buttock. We also follow him for wounds on the left lateral leg, left first second toe web space and right first second toe web space 12/15/17; we have been using Hydrofera Blue to the left lateral leg which has improved. The right first second toe web space has also improved. Left first second toe web space plantar aspect looks stable. The left buttock has worsened using Santyl. Apparently the buttock has drainage 12/22/17; we have been using Hydrofera Blue to the left lateral leg which continues to improve now 2 small wounds separated by normal skin. He tells [place] he had a fever up to 100 yesterday he is prone to UTIs but has not  noted anything different. He does in and out catheterizations. The area between the first and second toes today does not look good necrotic surface covered with what looks to be purulent drainage and erythema  extending into the third toe. I had gotten this to something that I thought look better last time however it is not look good today. He also has a necrotic surface over the buttock wound which is expanded. I thought there might be infection under here so I removed a lot of the surface with a #5 curet though nothing look like it really needed culturing. He is been using Santyl to this area 12/27/17; his original wound on the left lateral leg continues to improve using Hydrofera Blue. I gave him samples of Baxdella although he was unable to take them out of fear for an allergic reaction ["lump in his throat"].the culture I did of the purulent drainage from his second toe last week showed both enterococcus and a set Enterobacter I was also concerned about the erythema on the bottom of his foot although paradoxically although this looks somewhat better today. 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 We've been using silver alginate here. They came up today that he sleeps in a recliner, I'm not sure why but I asked him to stop this 01/03/18; his original wound we've been using Hydrofera Blue is now separated into 2 areas. ooUlcer on his left buttock is better he is off the recliner and sleeping in bed ooFinally both wound areas between his first and second toes also looks some better 01/10/18; his original wound on the left lateral leg is now separated into 2 wounds we've been using Hydrofera Blue ooUlcer on his left buttock has some drainage. There is a small probing site going into muscle layer superiorly.using silver alginate -He arrives today with a deep tissue injury on the left heel ooThe wound on the dorsal aspect of his first second toe on the left looks a lot betterusing silver alginate ketoconazole ooThe area on the first second toe web space on the right also looks a lot bette 01/17/18; his  original wound on the left lateral leg continues to progress using Hydrofera Blue ooUlcer on his left buttock also is smaller surface healthier except for a small probing site going into the muscle layer superiorly. 2.4 cm of tunneling in this area ooDTI on his left heel we have only been offloading. Looks better than last week no threatened open no evidence of infection oothe wound on the dorsal aspect of the first second toe on the left continues to look like it's regressing we have only been using silver alginate and terbinafine orally ooThe area in the first second toe web space on the right also looks to be a lot better using silver alginate and terbinafine I think this was prompted by tinea pedis 01/31/18; the patient was hospitalized in [place] last week apparently for a complicated UTI. He was discharged on cefepime he does in and out catheterizations. In the hospital he was discovered M I don't mild elevation of AST and ALTs and the terbinafine was stopped.predictably the pressure ulcer on his buttock looks betterusing silver alginate. The area on the left lateral leg also is better using Hydrofera Blue. The area between the first and second toes on the left better. First and second toes on the right still substantial but better. Finally the DTI on the left  heel has held together and looks like it's resolving 02/07/18-he is here in follow-up evaluation for multiple ulcerations. He has new injury to the lateral aspect of the last issue a pressure ulcer, he states this is from adhesive removal trauma. He states he has tried multiple adhesive products with no success. All other ulcers appear stable. The left heel DTI is resolving. We will continue with same treatment plan and follow-up next week. 02/14/18; follow-up for multiple areas. ooHe has a new area last week on the lateral aspect of his pressure ulcer more over the posterior trochanter. The original pressure ulcer looks quite  stable has healthy granulation. 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 Almost fully epithelialized on the original superior area using Hydrofera Blue ooDTI on the left heel has peeled off this week to reveal a small superficial wound under denuded skin and subcutaneous tissue ooBoth areas between the first and second toes look better including nothing open on the left 02/21/18; ooThe patient's wounds on his left ischial tuberosity and posterior left greater trochanter actually looked better. He has a large area of irritation around the area which I think is contact dermatitis. I am doubtful that this is fungal ooHis original wound on the left lateral calf continues to improve we have been using Hydrofera Blue ooThere is no open area in the left first second toe web space although there is a lot of thick callus ooThe DTI on the left heel required debridement today of necrotic surface eschar and subcutaneous tissue using silver alginate ooFinally the area on the right first second toe webspace continues to contract using silver alginate and ketoconazole 02/28/18 ooLeft ischial tuberosity wounds look better using silver alginate. ooOriginal wound on the left calf only has one small open area left using Hydrofera Blue ooDTI on the left heel required debridement mostly removing skin from around this wound surface. Using silver alginate ooThe areas on the right first/second toe web space using silver alginate and ketoconazole 03/08/18 on evaluation today patient appears to be doing decently well as best I can tell in regard to his wounds. This is the first time that I have seen him as he generally is followed by Dr. Dellia Nims. With that being said none of his wounds appear to be infected he does have an area where there is some skin covering what appears to be a new wound on the left  dorsal surface of his great toe. This is right at the nail bed. With that being said I do believe that debrided away some of the excess skin can be of benefit in this regard. Otherwise he has been tolerating the dressing changes without complication. 03/14/18; patient arrives today with the multiplicity of wounds that we are following. He has not been systemically unwell ooOriginal wound on the left lateral calf now only has 2 small open areas we've been using Hydrofera Blue which should continue ooThe deep tissue injury on the left heel requires debridement today. We've been using silver alginate ooThe left first second toe and the right first second toe are both are reminiscence what I think was tinea pedis. Apparently some of the callus Surface between the toes was removed last week when it started draining. ooPurulent drainage coming from the wound on the ischial tuberosity on the left. 03/21/18-He is here in follow-up evaluation for multiple wounds. There is improvement, he is currently taking doxycycline, culture obtained  last week grew tetracycline sensitive MRSA. He tolerated debridement. The only change to last week's recommendations is to discontinue antifungal cream between toes. He will follow-up next week 03/28/18; following up for multiple wounds;Concern this week is streaking redness and swelling in the right foot. He is going to need antibiotics for this. 03/31/18; follow-up for right foot cellulitis. Streaking redness and swelling in the right foot on 03/28/18. 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 I put him on clindamycin 300 mg every 6 and brought him in for a quick check. He has an open wound between his first and second toes on the right foot as a potential source. 04/04/18; ooRight foot cellulitis is resolving he is completing clindamycin. This is truly good news ooLeft lateral calf wound which is initial wound only has one small open area inferiorly  this is close to healing out. He has compression stockings. We will use Hydrofera Blue right down to the epithelialization of this ooNonviable surface on the left heel which was initially pressure with a DTI. We've been using Hydrofera Blue. I'm going to switch this back to silver alginate ooLeft first second toe/tinea pedis this looks better using silver alginate ooRight first second toe tinea pedis using silver alginate ooLarge pressure ulcers on theLeft ischial tuberosity. Small wound here Looks better. I am uncertain about the surface over the large wound. Using silver alginate 04/11/18; ooCellulitis in the right foot is resolved ooLeft lateral calf wound which was his original wounds still has 2 tiny open areas remaining this is just about closed ooNonviable surface on the left heel is better but still requires debridement ooLeft first second toe/tinea pedis still open using silver alginate ooRight first second toe wound tinea pedis I asked him to go back to using ketoconazole and silver alginate ooLarge pressure ulcers on the left ischial tuberosity this shear injury here is resolved. Wound is smaller. No evidence of infection using silver alginate 04/18/18; ooPatient arrives with an intense area of cellulitis in the right mid lower calf extending into the right heel area. Bright red and warm. Smaller area on the left anterior leg. He has a significant history of MRSA. He will definitely need antibioticsoodoxycycline ooHe now has 2 open areas on the left ischial tuberosity the original large wound and now a satellite area which I think was above his initial satellite areas. Not a wonderful surface on this satellite area surrounding erythema which looks like pressure related. ooHis left lateral calf wound again his original wound is just about closed ooLeft heel pressure injury still requiring debridement ooLeft first second toe looks a lot better using silver  alginate ooRight first second toe also using silver alginate and ketoconazole cream also looks better 04/20/18; the patient was worked in early today out of concerns with his cellulitis on the right leg. I had started him on doxycycline. This was 2 days ago. His wife was concerned about the swelling in the area. Also concerned about the left buttock. He has not been systemically unwell no fever chills. No nausea vomiting or diarrhea 04/25/18; the patient's left buttock wound is continued to deteriorate he is using Hydrofera Blue. He is still completing clindamycin for the cellulitis on the right leg although all of this looks better. 05/02/18 ooLeft buttock wound still with a lot of drainage and a very tightly adherent fibrinous necrotic surface. He has a deeper area superiorly ooThe left lateral calf wound is still closed ooDTI wound on the left heel necrotic surface  especially the circumference using Iodoflex ooAreas between his left first second toe and right first second toe both look better. Dorsally and the right first second toe he had a necrotic surface although at smaller. In using silver alginate and ketoconazole. I did a culture last week which was a deep tissue culture of the reminiscence of the open wound on the right first second toe dorsally. This grew a few Acinetobacter and a few methicillin-resistant staph aureus. Nevertheless the area actually this week looked better. I didn't feel the need to specifically address this at least in terms of systemic antibiotics. 05/09/18; wounds are measuring larger more drainage per our intake. We are using Santyl covered with alginate on the large superficial buttock wounds, Iodosorb on the left heel, ketoconazole and silver alginate to the dorsal first and second toes bilaterally. 05/16/18; ooThe area on his left buttock better in some aspects although the area superiorly over the ischial tuberosity required an extensive debridement.using  Santyl ooLeft heel appears stable. Using Iodoflex ooThe areas between his first and second toes are not bad however there is spreading erythema up the dorsal aspect of his left foot this looks like cellulitis again. He is insensate the erythema is really very brilliant.o Erysipelas He went to see an allergist days ago because he was itching part of this he had lab work done. This showed a white count of 15.1 with 70% neutrophils. Hemoglobin of 11.4 and a platelet count of 659,000. Last white count we had in Epic was a 2-1/2 years ago which was 25.9 but he was ill at the time. He was able to show me some lab work that was done by his primary physician the pattern is about the same. 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 But prompted me to go ahead and do x-rays of both feet and the pelvis rule out osteomyelitis. He also had a comprehensive metabolic panel this was reasonably normal his albumin was 3.7 liver function tests BUN/creatinine all normal 05/23/18; x-rays of both his feet from last week were negative for underlying pulmonary abnormality. The x-ray of his pelvis however showed mild irregularity in the left ischial which may represent some early osteomyelitis. The wound in the left ischial continues to get deeper clearly now exposed muscle. Each week necrotic surface material over this area. Whereas the rest of the wounds do not look so bad. ooThe left ischial wound we have been using Santyl and calcium alginate ooTo the left heel surface necrotic debris using Iodoflex ooThe left lateral leg is still healed ooAreas on the left dorsal foot and the right dorsal foot are about the same. There is some inflammation on the left which might represent contact dermatitis, fungal dermatitis I am doubtful cellulitis although this looks better than last week 05/30/18; CT scan done at Hospital did not show any osteomyelitis or abscess. Suggested the  possibility of underlying cellulitis although I don't see a lot of evidence of this at the bedside ooThe wound itself on the left buttock/upper thigh actually looks somewhat better. No debridement ooLeft heel also looks better no debridement continue Iodoflex ooBoth dorsal first second toe spaces appear better using Lotrisone. Left still required debridement 06/06/18; ooIntake reported some purulent looking drainage from the left gluteal wound. Using Santyl and calcium alginate ooLeft heel looks better although still a nonviable surface requiring debridement ooThe left dorsal foot first/second webspace actually expanding and somewhat deeper. I may consider doing a shave  biopsy of this area ooRight dorsal foot first/second webspace appears stable to improved. Using Lotrisone and silver alginate to both these areas 06/13/18 ooLeft gluteal surface looks better. Now separated in the 2 wounds. No debridement required. Still drainage. We'll continue silver alginate ooLeft heel continues to look better with Iodoflex continue this for at least another week ooOf his dorsal foot wounds the area on the left still has some depth although it looks better than last week. We've been using Lotrisone and silver alginate 06/20/18 ooLeft gluteal continues to look better healthy tissue ooLeft heel continues to look better healthy granulation wound is smaller. He is using Iodoflex and his long as this continues continue the Iodoflex ooDorsal right foot looks better unfortunately dorsal left foot does not. There is swelling and erythema of his forefoot. He had minor trauma to this several days ago but doesn't think this was enough to have caused any tissue injury. Foot looks like cellulitis, we have had this problem before 06/27/18 on evaluation today patient appears to be doing a little worse in regard to his foot ulcer. 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 Both of which would really be his only options for treating this infection. In the past he has been given and effusion of Orbactiv. This is done very well for him in the past again it's one time dosing IV antibiotic therapy. Subsequently I do believe this is something we're gonna need to see about doing at this point in time. Currently his other wounds seem to be doing somewhat better in my pinion I'm pretty happy in that regard. 07/03/18 on evaluation today patient's wounds actually appear to be doing fairly well. He has been tolerating the dressing changes without complication. All in all he seems to be showing signs of improvement. In regard to the antibiotics he has been dealing with infectious disease since I saw him last week as far as getting this scheduled. In the end [facility]'s [facility] this coming Friday. In the meantime he has been continuing to perform the dressing changes in such as previous. There does not appear to be any evidence of infection worsengin at this time. 07/10/18; ooSince I last saw this man 2 weeks ago things have actually improved. IV antibiotics of resulted in less forefoot erythema although there is still some present. He is not systemically unwell ooLeft buttock wounds o2 now have no depth there is increased epithelialization Using silver alginate ooLeft heel still requires debridement using Iodoflex ooLeft dorsal foot still with a sizable wound about the size of a border but healthy granulation ooRight dorsal foot still with a slitlike area using silver alginate 07/18/18; the patient's cellulitis in the left foot is improved in fact I think it is on its way to resolving. ooLeft buttock wounds o2 both look better although the larger one has hypertension granulation we've been using silver  alginate ooLeft heel has some thick circumferential redundant skin over the wound edge which will need to be removed today we've been using Iodoflex ooLeft dorsal foot is still a sizable wound required debridement using silver alginate ooThe right dorsal foot is just about closed only a small open area remains here 07/25/18; left foot cellulitis is resolved ooLeft buttock wounds o2 both look better. Hyper-granulation on the major area   ooLeft heel as some debris over the surface but otherwise looks a healthier wound. Using silver collagen ooRight dorsal foot is just about closed 07/31/18; arrives with our intake nurse worried about purulent drainage from the buttock. We had hyper-granulation here last week ooHis buttock wounds o2 continue to look better ooLeft heel some debris over the surface but measuring smaller. ooRight dorsal foot unfortunately has openings between the toes ooLeft foot superficial wound looks less aggravated. 08/07/18 ooButtock wounds continue to look better although some of her granulation and the larger medial wound. silver alginate ooLeft heel continues to look a lot better.silver collagen ooLeft foot superficial wound looks less stable. Requires debridement. He has a new wound superficial area on the foot on the lateral dorsal foot. ooRight foot looks better using silver alginate without Lotrisone 08/14/2018; patient was in the ER last week diagnosed with a UTI. He is now on Cefpodoxime and Macrodantin. ooButtock wounds continued to be smaller. Using silver alginate ooLeft heel continues to look better using silver collagen ooLeft foot superficial wound looks as though it is improving ooRight dorsal foot area is just about healed. 08/21/2018; patient is completed his antibiotics for his UTI. ooHe has 2 open areas on the buttocks. There is still not closed although the surface looks satisfactory. Using silver alginate ooLeft heel continues to  improve using silver collagen ooThe bilateral dorsal foot areas which are at the base of his first and second toes/possible tinea pedis are actually stable on the left but worse on the right. The area on the left required debridement of necrotic surface. After debridement I obtained a specimen for PCR culture. ooThe right dorsal foot which is been just about healed last week is now reopened 08/28/2018; culture done on the left dorsal foot showed coag negative staph both staph epidermidis and Lugdunensis. I think this is worthwhile initiating systemic treatment. I will use doxycycline given his long list of allergies. The area on the left heel slightly improved but still requiring debridement. ooThe large wound on the buttock is just about closed whereas the smaller one is larger. Using silver alginate in this area 09/04/2018; patient is completing his doxycycline for the left foot although this continues to be a very difficult wound area with very adherent necrotic debris. We are using silver alginate to all his wounds right foot left foot and the small wounds on his buttock, silver collagen on the left heel. 09/11/2018; once again this patient has intense erythema and swelling of the left forefoot. Lesser degrees of erythema in the right foot. He has a long list of allergies and intolerances. I will reinstitute doxycycline. 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 Using silver alginate ooLeft heel also looks better using silver collagen ooUnfortunately both the areas on his feet look worse. The area on the left first second webspace is now gone through to the plantar part of his foot. The area on the left foot anteriorly is irritated with erythema and swelling in the forefoot. 09/25/2018 ooHis wound on the left plantar heel looks better. Using silver collagen ooThe area on the left buttock 2 small remnant areas. One is closed one is  still open. Using silver alginate ooThe areas between both his first and second toes look worse. This in spite of long-standing antifungal therapy with ketoconazole and silver alginate which should have antifungal activity ooHe has small areas around his original wound on the left calf one is on  the bottom of the original scar tissue and one superiorly both of these are small and superficial but again given wound history in this site this is worrisome 10/02/2018 ooLeft plantar heel continues to gradually contract using silver collagen ooLeft buttock wound is unchanged using silver alginate ooThe areas on his dorsal feet between his first and second toes bilaterally look about the same. 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 Small open area here. We have been using silver alginate 10/09/2018; ooLeft plantar heel continues to look healthy and contract. No debridement is required ooLeft buttock slightly smaller with a tape injury wound just below which was new this week ooDorsal feet somewhat improved I have been using clindamycin ooLeft lateral looks lower extremity the actual open area looks worse although a lot of this is epithelialized. I am going to change to silver collagen today He has a lot more swelling in the right leg although this is not pitting not red and not particularly warm there is a lot of spasm in the right leg usually indicative of people with paralysis of some underlying discomfort. We have reviewed his vascular status from 2017 he had a left greater saphenous vein ablation. I wonder about referring him back to vascular surgery if the area on the left leg continues to deteriorate. 10/16/2018 in today for follow-up and management of multiple lower extremity ulcers. His left Buttock  wound is much lower smaller and almost closed completely. The wound to the left ankle has began to reopen with Epithelialization and some adherent slough. He has multiple new areas to the left foot and leg. The left dorsal foot without much improvement. Wound present between left great webspace and 2nd toe. Erythema and edema present right leg. Right LE ultrasound obtained on 10/10/18 was negative for DVT. 10/23/2018; ooLeft buttock is closed over. Still dry macerated skin but there is no open wound. I suspect this is chronic pressure/moisture ooLeft lateral calf is quite a bit worse than when I saw this last. There is clearly drainage here he has macerated skin into the left plantar heel. We will change the primary dressing to alginate ooLeft dorsal foot has some improvement in overall wound area. Still using clindamycin and silver alginate ooRight dorsal foot about the same as the left using clindamycin and silver alginate ooThe erythema in the right leg has resolved. He is DVT rule out was negative ooLeft heel pressure area required debridement although the wound is smaller and the surface is health 10/26/2018 ooThe patient came back in for his nurse check today predominantly because of the drainage coming out of the left lateral leg with a recent reopening of his original wound on the left lateral calf. He comes in today with a large amount of surrounding erythema around the wound extending from the calf into the ankle and even in the area on the dorsal foot. He is not systemically unwell. He is not febrile. Nevertheless this looks like cellulitis. We have been using silver alginate to the area. I changed him to a regular visit and I am going to prescribe him doxycycline. The rationale here is a long list of medication intolerances and a history of MRSA. I did not see anything that I thought would provide a valuable culture 10/30/2018 ooFollow-up from his appointment 4 days ago with  really an extensive area  of cellulitis in the left calf left lateral ankle and left dorsal foot. I put him on doxycycline. He has a long list of medication allergies which are true allergy reactions. 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 In any case he is a lot better today. The erythema especially in the anterior and lateral left calf is better. He still has left ankle erythema. He also is complaining about increasing edema in the right leg we have only been using Kerlix Coban and he has been doing the wraps at home. Finally he has a spotty rash on the medial part of his upper left calf which looks like folliculitis or perhaps wrap occlusion type injury. Small superficial macules not pustules 11/06/18 patient arrives today with again a considerable degree of erythema around the wound on the left lateral calf extending into the dorsal ankle and dorsal foot. This is a lot worse than when I saw this last week. He is on doxycycline really with not a lot of improvement. He has not been systemically unwell Wounds on the; left heel actually looks improved. Original area on the left foot and proximity to the first and second toes looks about the same. He has superficial areas on the dorsal foot, anterior calf and then the reopening of his original wound on the left lateral calf which looks about the same ooThe only area he has on the right is the dorsal webspace first and second which is smaller. ooHe has a large area of dry erythematous skin on the left buttock small open area here. 11/13/2018; the patient arrives in much better condition. The erythema around the wound on the left lateral calf is a lot better. Not sure whether this was the clindamycin or the TCA and ketoconazole or just in the improvement in edema control [stasis dermatitis]. In any case this is a lot better. The area on the left heel is very small and  just about resolved using silver collagen we have been using silver alginate to the areas on his dorsal feet 11/20/2018; his wounds include the left lateral calf, left heel, dorsal aspects of both feet just proximal to the first second webspace. He is stable to slightly improved. I did not think any changes to his dressings were going to be necessary 11/27/2018 he has a reopening on the left buttock which is surrounded by what looks like tinea or perhaps some other form of dermatitis. The area on the left dorsal foot has some erythema around it I have marked this area but I am not sure whether this is cellulitis or not. Left heel is not closed. Left calf the reopening is really slightly longer and probably worse 1/13; in general things look better and smaller except for the left dorsal foot. Area on the left heel is just about closed, left buttock looks better only a small wound remains in the skin looks better [using Lotrisone] 1/20; the area on the left heel only has a few remaining open areas here. Left lateral calf about the same in terms of size, left dorsal foot slightly larger right lateral foot still not closed. The area on the left buttock has no open wound and the surrounding skin looks a lot better 1/27; the area on the left heel is closed. Left lateral calf better but still requiring extensive debridements. The area on his left buttock is closed. He still has the open areas on the left dorsal foot  which is slightly smaller in the right foot which is slightly expanded. We have been using Iodoflex on these areas as well 2/3; left heel is closed. Left lateral calf still requiring debridement using Iodoflex there is no open area on his left buttock however he has dry scaly skin over a large area of this. Not really responding well to the Lotrisone. Finally the areas on his dorsal feet at the level of the first second webspace are slightly smaller on the right and about the same on the left.  Both of these vigorously debrided with Anasept and gauze 2/10; left heel remains closed he has dry erythematous skin over the left buttock but there is no open wound here. Left lateral leg has come in and with. Still requiring debridement we have been using Iodoflex here. Finally the area on the left dorsal foot and right dorsal foot are really about the same extremely dry callused fissured areas. He does not yet have a dermatology appointment 2/17; left heel remains closed. He has a new open area on the left buttock. The area on the left lateral calf is bigger longer and still covered in necrotic debris. No major change in his foot areas bilaterally. I am awaiting for a dermatologist to look on this. We have been using ketoconazole I do not know that this is been doing any good at all. 2/24; left heel remains closed. The left buttock wound that was new reopening last week looks better. The left lateral calf appears better also although still requires debridement. The major area on his foot is the left first second also requiring debridement. We have been putting Prisma on all wounds. I do not believe that the ketoconazole has done too much good for his feet. He will use Lotrisone I am going to give him a 2-week course of terbinafine. We still do not have a dermatology appointment 3/2 left heel remains closed however there is skin over bone in this area I pointed this out to him today. The left buttock wound is epithelialized but still does not look completely stable. The area on the left leg required debridement were using silver collagen here. With regards to his feet we changed to Lotrisone last week and silver alginate. 3/9; left heel remains closed. Left buttock remains closed. The area on the right foot is essentially closed. The left foot remains unchanged. Slightly smaller on the left lateral calf. Using silver collagen to both of these areas 3/16-Left heel remains closed. Area on right  foot is closed. Left lateral calf above the lateral malleolus open wound requiring debridement with easy bleeding. Left dorsal wound proximal to first toe also debrided. Left ischial area open new. Patient has been using Prisma with wrapping every 3 days. Dermatology appointment is apparently tomorrow.Patient has completed his terbinafine 2-week course with some apparent improvement according to him, there is still flaking and dry skin in his foot on the left 3/23; area on the right foot is reopened. The area on the left anterior foot is about the same still a very necrotic adherent surface. He still has the area on the left leg and reopening is on the left buttock. He apparently saw dermatology although I do not have a note. According to the patient who is usually fairly well informed they did not have any good ideas. Put him on oral terbinafine which she is been on before. 3/30; using silver collagen to all wounds. Apparently his dermatologist put him on doxycycline and rifampin presumably  some culture grew staph. I do not have this result. He remains on terbinafine although I have used terbinafine on him before 4/6; patient has had a fairly substantial reopening on the right foot between the first and second toes. He is finished his terbinafine and I believe is on doxycycline and rifampin still as prescribed by dermatology. We have been using silver collagen to all his wounds although the patient reports that he thinks silver alginate does better on the wounds on his buttock. 4/13; the area on his left lateral calf about the same size but it did not require debridement. ooLeft dorsal foot just proximal to the webspace between the first and second toes is about the same. Still nonviable surface. I note some superficial bronze discoloration of the dorsal part of his foot ooRight dorsal foot just proximal to the first and second toes also looks about the same. I still think there may be  the same discoloration I noted above on the left ooLeft buttock wound looks about the same 4/20; left lateral calf appears to be gradually contracting using silver collagen. 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 The left dorsal foot wound is debrided of tightly adherent necrotic debris and really cleans up quite [name]. The right area is worse with expansion. I did not debride this it is now over the base of the second toe ooThe area on his left buttock is smaller no debridement is required using silver collagen 5/4; left calf continues to make good progress. ooHe arrives with erythema around the wounds on his dorsal foot which even extends to the plantar aspect. Very concerning for coexistent infection. He is finished the erythromycin I gave him for possible erythrasma this does not seem to have helped. ooThe area on the left foot is about the same base of the dorsal toes ooIs area on the buttock looks improved on the left 5/11; left calf and left buttock continued to make good progress. Left foot is about the same to slightly improved. ooMajor problem is on the right foot. He has not had an x-ray. Deep tissue culture I did last week showed both Enterobacter and E. coli. I did not change the doxycycline I put him on empirically although neither 1 of these were plated to doxycycline. He arrives today with the erythema looking worse on both the dorsal and plantar foot. Macerated skin on the bottom of the foot. he has not been systemically unwell 5/18-Patient returns at 1 week, left calf wound appears to be making some progress, left buttock wound appears slightly worse than last time, left foot wound looks slightly better, right foot redness is marginally better. X-ray of both feet show no air or evidence of osteomyelitis. Patient is finished his Omnicef and terbinafine. He continues to have macerated skin on the bottom of the  left foot as well as right 5/26; left calf wound is better, left buttock wound appears to have multiple small superficial open areas with surrounding macerated skin. X-rays that I did last time showed no evidence of osteomyelitis in either foot. He is finished cefdinir and doxycycline. I do not think that he was on terbinafine. He continues to have a large superficial open area on the right foot anterior dorsal and slightly between the first and second toes. I did send him to dermatology 2 months ago or so wondering about whether they would do a fungal scraping. I do not believe they did but did do a culture.  We have been using silver alginate to the toe areas, he has been using antifungals at home topically either ketoconazole or Lotrisone. We are using silver collagen on the left foot, silver alginate on the right, silver collagen on the left lateral leg and silver alginate on the left buttock 6/1; left buttock area is healed. We have the left dorsal foot, left lateral leg and right dorsal foot. We are using silver alginate to the areas on both feet and silver collagen to the area on his left lateral calf 6/8; the left buttock apparently reopened late last week. He is not really sure how this happened. He is tolerating the terbinafine. Using silver alginate to all wounds 6/15; left buttock wound is larger than last week but still superficial. ooCame in the clinic today with a report of purulence from the left lateral leg I did not identify any infection ooBoth areas on his dorsal feet appear to be better. He is tolerating the terbinafine. Using silver alginate to all wounds 6/22; left buttock is about the same this week, left calf quite a bit better. His left foot is about the same however he comes in with erythema and warmth in the right forefoot once again. Culture that I gave him in the beginning of May showed Enterobacter and E. coli. I gave him doxycycline and things seem to improve  although neither 1 of these organisms was specifically plated. 6/29; left buttock is larger and dry this week. Left lateral calf looks to me to be improved. Left dorsal foot also somewhat improved right foot completely unchanged. The erythema on the right foot is still present. He is completing the Ceftin dinner that I gave him empirically [see discussion above.) 7/6 - All wounds look to be stable and perhaps improved, the left buttock wound is slightly smaller, per patient bleeds easily, completed ceftin, the right foot redness is less, he is on terbinafine 7/13; left buttock wound about the same perhaps slightly narrower. Area on the left lateral leg continues to narrow. Left dorsal foot slightly smaller right foot about the same. We are using silver alginate on the right foot and Hydrofera Blue to the areas on the left. Unna boot on the left 2 layer compression on the right 7/20; left buttock wound absolutely the same. Area on lateral leg continues to get better. Left dorsal foot require debridement as did the right no major change in the 7/27; left buttock wound the same size necrotic debris over the surface. The area on the lateral leg is closed once again. His left foot looks better right foot about the same although there is some involvement now of the posterior first second toe area. He is still on terbinafine which I have given him for a month, not certain a centimeter major change 06/25/19-All wounds appear to be slightly improved according to report, left buttock wound looks clean, both foot wounds have minimal to no debris the right dorsal foot has minimal slough. We are using Hydrofera Blue to the left and silver alginate to the right foot and ischial wound. 8/10-Wounds all appear to be around the same, the right forefoot distal part has some redness which was not there before, however the wound looks clean and small. Ischial wound looks about the same with no changes 8/17; his wound  on the left lateral calf which was his original chronic venous insufficiency wound remains closed. Since I last saw him the areas on the left dorsal foot right dorsal foot generally appear  better but require debridement. The area on his left initial tuberosity appears somewhat larger to me perhaps hyper granulated and bleeds very easily. We have been using Hydrofera Blue to the left dorsal foot and silver alginate to everything else 8/24; left lateral calf remains closed. The areas on his dorsal feet on the webspace of the first and second toes bilaterally both look better. The area on the left buttock which is the pressure ulcer stage II slightly smaller. I change the dressing to Hydrofera Blue to all areas 8/31; left lateral calf remains closed. The area on his dorsal feet bilaterally look better. Using Hydrofera Blue. Still requiring debridement on the left foot. No change in the left buttock pressure ulcers however 9/14; left lateral calf remains closed. Dorsal feet look quite a bit better than 2 weeks ago. Flaking dry skin also a lot better with the ammonium lactate I gave him 2 weeks ago. The area on the left buttock is improved. He states that his Roho cushion developed a leak and he is getting a new one, in the interim he is offloading this vigorously 9/21; left calf remains closed. Left heel which was a possible DTI looks better this week. He had macerated tissue around the left dorsal foot right foot looks satisfactory and improved left buttock wound. I changed his dressings to his feet to silver alginate bilaterally. Continuing Hydrofera Blue on the left buttock. 9/28 left calf remains closed. Left heel did not develop anything [possible DTI] dry flaking skin on the left dorsal foot. Right foot looks satisfactory. Improved left buttock wound. We are using silver alginate on his feet Hydrofera Blue on the buttock. I have asked him to go back to the Lotrisone on his feet including the  wounds and surrounding areas 10/5; left calf remains closed. The areas on the left and right feet about the same. A lot of this is epithelialized however debris over the remaining open areas. He is using Lotrisone and silver alginate. The area on the left buttock using Hydrofera Blue 10/26. Patient has been out for 3 weeks secondary to Covid concerns. He tested negative but I think his wife tested positive. He comes in today with the left foot substantially worse, right foot about the same. Even more concerning he states that the area on his left buttock closed over but then reopened and is considerably deeper in one aspect than it was before [stage III wound] 11/2; left foot really about the same as last week. Quarter sized wound on the dorsal foot just proximal to the first second toes. Surrounding erythema with areas of denuded epithelium. This is not really much different looking. Did not look like cellulitis this time however. ooRight foot area about the same.. We have been using silver alginate alginate on his toes ooLeft buttock still substantial irritated skin around the wound which I think looks somewhat better. We have been using Hydrofera Blue here. 11/9; left foot larger than last week and a very necrotic surface. Right foot I think is about the same perhaps slightly smaller. Debris around the circumference also addressed. Unfortunately on the left buttock there is been a decline. Satellite lesions below the major wound distally and now a an additional one posteriorly we have been using Hydrofera Blue but I think this is a pressure issue 11/16; left foot ulcer dorsally again a very adherent necrotic surface. Right foot is about the same. Not much change in the pressure ulcer on his left buttock. 11/30; left foot ulcer  dorsally basically the same as when I saw him 2 weeks ago. Very adherent fibrinous debris on the wound surface. Patient reports a lot of drainage as well. The  character of this wound has changed completely although it has always been refractory. We have been using Iodoflex, patient changed back to alginate because of the drainage. Area on his right dorsal foot really looks benign with a healthier surface certainly a lot better than on the left. Left buttock wounds all improved using Hydrofera Blue 12/7; left dorsal foot again no improvement. Tightly adherent debris. PCR culture I did last week only showed likely skin contaminant. I have gone ahead and done a punch biopsy of this which is about the last thing in terms of investigations I can think to do. He has known venous insufficiency and venous hypertension and this could be the issue here. The area on the right foot is about the same left buttock slightly worse according to our intake nurse secondary to [facility] Blue sticking to the wound 12/14; biopsy of the left foot that I did last time showed changes that could be related to wound healing/chronic stasis dermatitis phenomenon no neoplasm. We have been using silver alginate to both feet. I change the one on the left today to Sorbact and silver alginate to his other 2 wounds 12/28; the patient arrives with the following problems; ooMajor issue is the dorsal left foot which continues to be a larger deeper wound area. Still with a completely nonviable surface ooParadoxically the area mirror image on the right on the right dorsal foot appears to be getting better. ooHe had some loss of dry denuded skin from the lower part of his original wound on the left lateral calf. Some of this area looked a little vulnerable and for this reason we put him in wrap that on this side this week ooThe area on his left buttock is larger. He still has the erythematous circular area which I think is a combination of pressure, sweat. This does not look like cellulitis or fungal dermatitis 11/26/2019; -Dorsal left foot large open wound with depth. Still debris over  the surface. Using Sorbact ooThe area on the dorsal right foot paradoxically has closed over [facility] has a reopening on the left ankle laterally at the base of his original wound that extended up into the calf. This appears clean. ooThe left buttock wound is smaller but with very adherent necrotic debris over the surface. We have been using silver alginate here as well The patient had arterial studies done in 2017. He had biphasic waveforms at the dorsalis pedis and posterior tibial bilaterally. ABI in the left was 1.17. Digit waveforms were dampened. He has slight spasticity in the great toes I do not think a TBI would be possible 1/11; the patient comes in today with a sizable reopening between the first and second toes on the right. This is not exactly in the same location where we have been treating wounds previously. According to our intake nurse this was actually fairly deep but 0.6 cm. The area on the left dorsal foot looks about the same the surface is somewhat cleaner using Sorbact, his MRI is in 2 days. We have not managed yet to get arterial studies. The new reopening on the left lateral calf looks somewhat better using alginate. The left buttock wound is about the same using alginate 1/18; the patient had his ARTERIAL studies which were quite normal. ABI in the right at 1.13 with triphasic/biphasic waveforms on  the left ABI 1.06 again with triphasic/biphasic waveforms. It would not have been possible to have done a toe brachial index because of spasticity. We have been using Sorbac to the left foot alginate to the rest of his wounds on the right foot left lateral calf and left buttock 1/25; arrives in clinic with erythema and swelling of the left forefoot worse over the first MTP area. This extends laterally dorsally and but also posteriorly. Still has an area on the left lateral part of the lower part of his calf wound it is eschared and clearly not closed. ooArea on the left  buttock still with surrounding irritation and erythema. ooRight foot surface wound dorsally. The area between the right and first and second toes appears better. 2/1; ooThe left foot wound is about the same. Erythema slightly better I gave him a week of doxycycline empirically ooRight foot wound is more extensive extending between the toes to the plantar surface ooLeft lateral calf really no open surface on the inferior part of his original wound however the entire area still looks vulnerable ooAbsolutely no improvement in the left buttock wound required debridement. 2/8; the left foot is about the same. Erythema is slightly improved I gave him clindamycin last week. ooRight foot looks better he is using Lotrimin and silver alginate ooHe has a breakdown in the left lateral calf. Denuded epithelium which I have removed ooLeft buttock about the same were using Hydrofera Blue 2/15; left foot is about the same there is less surrounding erythema. Surface still has tightly adherent debris which I have debriding however not making any progress ooRight foot has a substantial wound on the medial right second toe between the first and second webspace. ooStill an open area on the left lateral calf distal area. ooButtock wound is about the same 2/22; left foot is about the same less surrounding erythema. Surface has adherent debris. Polymen Ag Right foot area significant wound between the first and second toes. We have been using silver alginate here Left lateral leg polymen Ag at the base of his original venous insufficiency wound ooLeft buttock some improvement here Objective Constitutional Sitting or standing Blood Pressure is within target range for patient.. Pulse regular and within target range for patient.Marland Kitchen Respirations regular, non-labored and within target range.. Temperature is normal and within the target range for the patient.Marland Kitchen Appears in no distress. Vitals Time Taken: 7:50 AM,  Height: 70 in, Weight: 216 lbs, BMI: 31, Temperature: 98.3 F, Pulse: 120 bpm, Respiratory Rate: 20 breaths/min, Blood Pressure: 130/60 mmHg. General Notes: Wound exam; debridement between the right first and second toes which was not easy due to spasms. Nonviable surface on the left foot and left lateral calf. All done with a #5 curette hemostasis with direct pressure Integumentary (Hair, Skin) Wound #24 status is Open. Original cause of wound was Gradually Appeared. The wound is located on the Left,Dorsal Foot. The wound measures 2.9cm length x 2.5cm width x 0.3cm depth; 5.694cm^2 area and 1.708cm^3 volume. There is Fat Layer (Subcutaneous Tissue) Exposed exposed. There is no tunneling or undermining noted. There is a large amount of serosanguineous drainage noted. The wound margin is distinct with the outline attached to the wound base. There is small (1-33%) red granulation within the wound bed. There is a large (67-100%) amount of necrotic tissue within the wound bed including Adherent Slough. Wound #35 status is Open. Original cause of wound was Gradually Appeared. The wound is located on the Left Ischium. The wound measures 4.2cm length  x 1.4cm width x 0.1cm depth; 4.618cm^2 area and 0.462cm^3 volume. There is Fat Layer (Subcutaneous Tissue) Exposed exposed. There is no tunneling or undermining noted. There is a medium amount of serosanguineous drainage noted. The wound margin is flat and intact. There is large (67-100%) red granulation within the wound bed. There is no necrotic tissue within the wound bed. Wound #37 status is Open. Original cause of wound was Gradually Appeared. The wound is located on the Left,Lateral Malleolus. The wound measures 2.8cm length x 1.1cm width x 0.1cm depth; 2.419cm^2 area and 0.242cm^3 volume. There is Fat Layer (Subcutaneous Tissue) Exposed exposed. There is no tunneling or undermining noted. There is a medium amount of serosanguineous drainage noted.  The wound margin is distinct with the outline attached to the wound base. There is no granulation within the wound bed. There is a large (67- 100%) amount of necrotic tissue within the wound bed including Adherent Slough. Wound #38 status is Open. Original cause of wound was Gradually Appeared. The wound is located on the Right Toe - Web between 1st and 2nd. The wound measures 3cm length x 3.5cm width x 0.5cm depth; 8.247cm^2 area and 4.123cm^3 volume. There is Fat Layer (Subcutaneous Tissue) Exposed exposed. There is no tunneling or undermining noted. There is a medium amount of serosanguineous drainage noted. The wound margin is distinct with the outline attached to the wound base. There is medium (34-66%) pink, pale granulation within the wound bed. There is a medium (34-66%) amount of necrotic tissue within the wound bed including Adherent Slough. 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 There was a Excisional Skin/Subcutaneous Tissue Debridement with a total area of 7.25 sq cm performed by Ricard Dillon., MD. With the following instrument(s): Curette to remove Viable and Non-Viable tissue/material. Material removed includes Subcutaneous Tissue and Slough and. No specimens were taken. A time out was conducted at 08:16, prior to the start of the procedure. A Minimum amount of bleeding was controlled with Pressure. The procedure was tolerated well with a pain level of 0 throughout and a pain level of 0 following the procedure. Post Debridement Measurements:  2.9cm length x 2.5cm width x 0.3cm depth; 1.708cm^3 volume. Character of Wound/Ulcer Post Debridement is improved. 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 There was a Excisional Skin/Subcutaneous Tissue Debridement with a total area of 3.08 sq cm performed by Ricard Dillon., MD. With the following instrument(s): Curette to remove Viable and Non-Viable tissue/material. Material removed includes Subcutaneous Tissue and Slough and. No specimens were taken. A time out was conducted at 08:16, prior to the start of the procedure. A Minimum amount of bleeding was controlled with Pressure. The procedure was tolerated well with a pain level of 0 throughout and a pain level of 0 following the procedure. Post Debridement Measurements: 2.8cm length x 1.1cm width x 0.1cm depth; 0.242cm^3 volume. Character of Wound/Ulcer Post Debridement is improved. Severity of Tissue Post Debridement is: Fat layer exposed.  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 There was a Three Layer Compression Therapy Procedure by Levan Hurst, RN. Post procedure Diagnosis Wound #37: Same as Pre-Procedure Wound #38 Pre-procedure diagnosis of Wound #38 is an Inflammatory located on the Right Toe - Web between 1st and 2nd . There was a Excisional Skin/Subcutaneous Tissue Debridement with a total area of 10.5 sq cm performed by Ricard Dillon., MD. With the following instrument(s): Curette to remove Viable and Non-Viable tissue/material. Material removed includes Subcutaneous Tissue and Slough and. No specimens were taken. A time out was conducted at 08:16, prior to the start of the procedure. A Minimum amount of bleeding was  controlled with Pressure. The procedure was tolerated well with a pain level of 0 throughout and a pain level of 0 following the procedure. Post Debridement Measurements: 3cm length x 3.5cm width x 0.5cm depth; 4.123cm^3 volume. Character of Wound/Ulcer Post Debridement is improved. Post procedure Diagnosis Wound #38: Same as Pre-Procedure Plan Follow-up Appointments: Return Appointment in 1 week. Dressing Change Frequency: Wound #24 Left,Dorsal Foot: Do not change entire dressing for one week. Wound #35 Left Ischium: Change Dressing every other day. Wound #37 Left,Lateral Malleolus: Do not change entire dressing for one week. Wound #38 Right Toe - Web between 1st and 2nd: Change Dressing every other day. Skin Barriers/Peri-Wound Care: Antifungal cream - on toes on both feet daily Moisturizing lotion - both legs Other: - Triamcinolone cream Wound #24 Left,Dorsal Foot: Barrier cream Moisturizing lotion - to both legs and feet Primary Wound Dressing: Wound #24 Left,Dorsal Foot: Polymem Silver Wound #35 Left Ischium: Polymem Silver Wound #37 Left,Lateral Malleolus: Polymem Silver Wound #38 Right Toe - Web between 1st and 2nd: Calcium Alginate with Silver Secondary Dressing: Wound #24 Left,Dorsal Foot: Dry Gauze Heel Cup - add heel cup to left heel for protection. Wound #35 Left Ischium: Foam Border - or ABD pad and tape Wound #37 Left,Lateral Malleolus: Dry Gauze Wound #38 Right Toe - Web between 1st and 2nd: Kerlix/Rolled Gauze - secure with tape Dry Gauze Edema Control: 3 Layer Compression System - Left Lower Extremity Elevate legs to the level of the heart or above for 30 minutes daily and/or when sitting, a frequency of: - throughout the day Support Garment 30-40 mm/Hg pressure to: - Juxtalite to right leg Off-Loading: Low air-loss mattress (Group 2) Roho cushion for wheelchair Turn and reposition every 2 hours - out of wheelchair throughout the day, try to lay on  sides, sleep in the bed not the recliner 1. I am continuing with polymen Ag to everything except the right first second toes 2. I am continuing terbinafine for another week wondering whether some of the erythema on the dorsal left foot could be fungal. It does not seem to have abruptly responded as of yet 3. Improvement in the left buttock wound with the polymen AG 4. He has not been able to get anybody at his insurance to answer the phone Electronic Signature(s) Signed: 01/14/2020 5:55:43 PM By: Linton Ham MD Entered By: Linton Ham on 01/14/2020 [id number] -------------------------------------------------------------------------------- SuperBill Details Patient Name: Date of Service: [name] 01/14/2020 Medical Record [id number] Patient Account Number: [id number] Date of Birth/Sex: Treating RN: 1986/06/10 (34 y.o. [name] Primary Care Provider: [place], [place] Other Clinician: Referring Provider: Treating Provider/Extender:Alvah Gilder, [name], [name]: 210 Diagnosis Coding ICD-10 Codes Code Description L97.521 Non-pressure chronic ulcer of other  part of left foot limited to breakdown of skin L97.511 Non-pressure chronic ulcer of other part of right foot limited to breakdown of skin 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, complete 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, stage 3 L97.321 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 to breakdown of skin L03.116 Cellulitis of left lower limb Facility Procedures CPT4 Code Description: [id number] - DEB SUBQ TISSUE 20 SQ CM/< ICD-10 Diagnosis Description L97.521 Non-pressure chronic ulcer of other part of left foot li L97.511 Non-pressure chronic ulcer of other part of right foot l L97.321 Non-pressure chronic  ulcer of left ankle limited to brea Modifier: mited to breakd imited to break kdown of skin Quantity: 1 own of skin down of skin CPT4 Code Description: [id number] - DEB SUBQ TISS EA ADDL 20CM  ICD-10 Diagnosis Description L97.521 Non-pressure chronic ulcer of other part of left foot li L97.511 Non-pressure chronic ulcer of other part of right foot l L97.321 Non-pressure chronic  ulcer of left ankle limited to brea Modifier: mited to breakd imited to break kdown of skin Quantity: 1 own of skin down of skin Physician Procedures CPT4 Code Description: [id number] - WC PHYS SUBQ TISS 20 SQ CM ICD-10 Diagnosis Description L97.521 Non-pressure chronic ulcer of other part of left foot limited L97.511 Non-pressure chronic ulcer of other part of right foot limit L97.321 Non-pressure  chronic ulcer of left ankle limited to breakdow Modifier: to breakdown o ed to breakdown n of skin Quantity: 1 f skin of skin CPT4 Code Description: [id number] - WC PHYS SUBQ TISS EA ADDL 20 CM ICD-10 Diagnosis Description L97.521 Non-pressure chronic ulcer of other part of left foot limite L97.511 Non-pressure chronic ulcer of other part of right foot limit L97.321  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 to breakdow Modifier: d to breakdown ed to breakdown n of skin Quantity: 1 of skin of skin Electronic Signature(s) Signed: 01/14/2020 5:55:43 PM By: Linton Ham MD Entered By: Linton Ham on 01/14/2020 08:29:39

## 2020-01-17 NOTE — Progress Notes (Signed)
Basista, COLEMAN QUAYE ([id number]) Visit Report for 01/14/2020 Arrival Information Details Patient Name: Date of Service: KURTUS, VIOLET 01/14/2020 7:30 AM Medical Record [id number] Patient Account Number: [id number] Date of Birth/Sex: Treating RN: 07-22-1986 (34 y.o. [name] Primary Care Lamaria Hildebrandt: [place], [place] Other Clinician: Referring Kingdavid Leinbach: Treating Dale Strausser/Extender:Robson, [name], [name]: 210 Visit Information History Since Last Visit Added or deleted any medications: No Patient Arrived: Wheel Chair Any new allergies or adverse reactions: No Arrival Time: 07:46 Had a fall or experienced change in No activities of daily living that may affect Accompanied By: self risk of falls: Transfer Assistance: None Signs or symptoms of abuse/neglect since last No Patient Identification Verified: Yes visito Secondary Verification Process Completed: Yes Hospitalized since last visit: No Patient Requires Transmission-Based No Implantable device outside of the clinic excluding No Precautions: cellular tissue based products placed in the center Patient Has Alerts: Yes since last visit: Patient Alerts: R ABI = Has Dressing in Place as Prescribed: Yes 1.0 Has Compression in Place as Prescribed: Yes L ABI = Pain Present Now: No 1.1 Electronic Signature(s) Signed: 01/14/2020 5:30:41 PM By: Deon Pilling Entered By: Deon Pilling on 01/14/2020 07:51:15 -------------------------------------------------------------------------------- Compression Therapy Details Patient Name: Date of Service: MERRILL, SELDON 01/14/2020 7:30 AM Medical Record [id number] Patient Account Number: [id number] Date of Birth/Sex: Treating RN: October 27, 1986 (34 y.o. [name] Primary Care Tarini Carrier: [place], [place] Other Clinician: Referring Jhair Witherington: Treating Jeremaih Klima/Extender:Robson, [name], [name]: 210 Compression Therapy Performed for  Wound Wound #37 Left,Lateral Malleolus Assessment: Performed By: Clinician Levan Hurst, RN Compression Type: Three Layer Post Procedure Diagnosis Same as Pre-procedure Electronic Signature(s) Signed: 01/17/2020 8:56:01 AM By: Levan Hurst RN, BSN Entered By: Levan Hurst on 01/14/2020 08:20:05 -------------------------------------------------------------------------------- Encounter Discharge Information Details Patient Name: Date of Service: Canizares, [name] 01/14/2020 7:30 AM Medical Record [id number] Patient Account Number: [id number] Date of Birth/Sex: Treating RN: 1986-04-14 (34 y.o. [name] Primary Care Naiyah Klostermann: [name], [name] Other Clinician: Referring Murrell Elizondo: Treating Bennett Vanscyoc/Extender:Robson, [name], [name]: 210 Encounter Discharge Information Items Post Procedure Vitals Discharge Condition: Stable Temperature (F): 98.3 Ambulatory Status: Wheelchair Pulse (bpm): 120 Discharge Destination: Home Respiratory Rate (breaths/min): 20 Transportation: Private Auto Blood Pressure (mmHg): 130/60 Accompanied By: self Schedule Follow-up Appointment: Yes Clinical Summary of Care: Electronic Signature(s) Signed: 01/14/2020 5:30:41 PM By: Deon Pilling Entered By: Deon Pilling on 01/14/2020 08:56:16 -------------------------------------------------------------------------------- Lower Extremity Assessment Details Patient Name: Date of Service: JEROL, ELZA 01/14/2020 7:30 AM Medical Record [id number] Patient Account Number: [id number] Date of Birth/Sex: Treating RN: 07/22/1986 (34 y.o. [name] Primary Care Jaliyah Fotheringham: [place], [place] Other Clinician: Referring Jaimon Bugaj: Treating Jeremyah Jelley/Extender:Robson, [name], [name]: 210 Edema Assessment Assessed: [Left: Yes] [Right: Yes] Edema: [Left: Yes] [Right: Yes] Calf Left: Right: Point of Measurement: 33 cm From Medial Instep 27 cm 32  cm Ankle Left: Right: Point of Measurement: 10 cm From Medial Instep 24 cm 23 cm Vascular Assessment Pulses: Dorsalis Pedis Palpable: [Left:Yes] [Right:Yes] Electronic Signature(s) Signed: 01/14/2020 5:30:41 PM By: Deon Pilling Entered By: Deon Pilling on 01/14/2020 07:53:51 -------------------------------------------------------------------------------- Multi Wound Chart Details Patient Name: Date of Service: [name] 01/14/2020 7:30 AM Medical Record [id number] Patient Account Number: [id number] Date of Birth/Sex: Treating RN: 07-Aug-1986 (34 y.o. [name] Primary Care Cherri Yera: [name], [name] Other Clinician: Referring Deitrich Steve: Treating Benelli Winther/Extender:Robson, [name], [name]: 210 Vital Signs Height(in): 70 Pulse(bpm): 120 Weight(lbs): 216 Blood Pressure(mmHg): 130/60 Body Mass Index(BMI): 31 Temperature(F): 98.3 Respiratory  20 Rate(breaths/min): Photos: [24:No Photos] [35:No Photos] [37:No Photos] Wound Location: [24:Left Foot - Dorsal] [35:Left Ischium] [37:Left Malleolus - Lateral] Wounding Event: [24:Gradually Appeared] [35:Gradually Appeared] [37:Gradually Appeared] Primary Etiology: [24:Inflammatory] [35:Pressure Ulcer] [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] Comorbid History: [24:Sleep Apnea, Hypertension, Sleep Apnea, Hypertension, Sleep Apnea, Hypertension, Paraplegia] [35:Paraplegia] [37:Paraplegia] Date Acquired: [24:03/08/2018] [35:04/28/2019] [37:11/26/2019] Weeks of Treatment: [24:96] [35:37] [37:7] Wound Status: [24:Open] [35:Open] [37:Open] Clustered Wound: [24:Yes] [35:Yes] [37:No] Clustered Quantity: [24:1] [35:1] [37:N/A] Measurements L x W x D 2.9x2.5x0.3 [35:4.2x1.4x0.1] [37:2.8x1.1x0.1] (cm) Area (cm) : [24:5.694] [35:4.618] [37:2.419] Volume (cm) : [24:1.708] [35:0.462] [37:0.242] % Reduction in Area: [24:-2312.70%] [35:67.30%] [37:38.90%] % Reduction in Volume: [24:-7016.70%] [35:67.30%]  [37:38.90%] Classification: [24:Full Thickness Without Exposed Support Structures] [35:Category/Stage III] [37:Full Thickness Without Exposed Support Structures] Exudate Amount: [24:Large] [35:Medium] [37:Medium] Exudate Type: [24:Serosanguineous] [35:Serosanguineous] [37:Serosanguineous] Exudate Color: [24:red, brown] [35:red, brown] [37:red, brown] Wound Margin: [24:Distinct, outline attached Flat and Intact] [37:Distinct, outline attached] Granulation Amount: [24:Small (1-33%)] [35:Large (67-100%)] [37:None Present (0%)] Granulation Quality: [24:Red] [35:Red] [37:N/A] Necrotic Amount: [24:Large (67-100%)] [35:None Present (0%)] [37:Large (67-100%)] Exposed Structures: [24:Fat Layer (Subcutaneous Fat Layer (Subcutaneous Tissue) Exposed: Yes Fascia: No Tendon: No Muscle: No Joint: No Bone: No] [35:Tissue) Exposed: Yes Fascia: No Tendon: No Muscle: No Joint: No Bone: No] [37:Fat Layer (Subcutaneous Tissue) Exposed: Yes  Fascia: No Tendon: No Muscle: No Joint: No Bone: No] Epithelialization: [24:None] [35:Small (1-33%)] [37:Medium (34-66%)] Debridement: [24:Debridement - Excisional N/A] [37:Debridement - Excisional] Pre-procedure [24:08:16] [35:N/A] [37:08:16] Verification/Time Out Taken: Tissue Debrided: [24:Subcutaneous, Slough] [35:N/A] [37:Subcutaneous, Slough] Level: [24:Skin/Subcutaneous Tissue] [35:N/A] [37:Skin/Subcutaneous Tissue] Debridement Area (sq cm):7.25 [35:N/A] [37:3.08] Instrument: [24:Curette] [35:N/A] [37:Curette] Bleeding: [24:Minimum] [35:N/A] [37:Minimum] Hemostasis Achieved: [24:Pressure] [35:N/A] [37:Pressure] Procedural Pain: [24:0] [35:N/A] [37:0] Post Procedural Pain: [24:0] [35:N/A] [37:0] Debridement Treatment Procedure was tolerated [35:N/A] [37:Procedure was tolerated] Response: [24:well] [37:well] Post Debridement [24:2.9x2.5x0.3] [35:N/A] [37:2.8x1.1x0.1] Measurements L x W x D (cm) Post Debridement [24:1.708] [35:N/A] [37:0.242] Volume:  (cm) Procedures Performed: Debridement [24:38] [35:N/A N/A] [37:Compression Therapy Debridement N/A] Photos: [24:No Photos] [35:N/A] [37:N/A] Wound Location: [24:Right Toe - Web between N/A 1st and 2nd] [37:N/A] Wounding Event: [24:Gradually Appeared] [35:N/A] [37:N/A] Primary Etiology: [24:Inflammatory] [35:N/A] [37:N/A] Comorbid History: [24:Sleep Apnea, Hypertension, N/A Paraplegia] [37:N/A] Date Acquired: [24:11/30/2019] [35:N/A] [37:N/A] Weeks of Treatment: [24:6] [35:N/A] [37:N/A] Wound Status: [24:Open] [35:N/A] [37:N/A] Clustered Wound: [24:No] [35:N/A] [37:N/A] Clustered Quantity: [24:N/A] [35:N/A] [37:N/A] Measurements L x W x D 3x3.5x0.5 [35:N/A] [37:N/A] (cm) Area (cm) : [24:8.247] [35:N/A] [37:N/A] Volume (cm) : [id number] [35:N/A] [37:N/A] % Reduction in Area: [24:-2399.10%] [35:N/A] [37:N/A] % Reduction in Volume: -1684.80% [35:N/A] [37:N/A] Classification: [24:Full Thickness Without Exposed Support Structures] [35:N/A] [37:N/A] Exudate Amount: [24:Medium] [35:N/A] [37:N/A] Exudate Type: [24:Serosanguineous] [35:N/A] [37:N/A] Exudate Color: [24:red, brown] [35:N/A] [37:N/A] Wound Margin: [24:Distinct, outline attached] [35:N/A] [37:N/A] Granulation Amount: [24:Medium (34-66%)] [35:N/A] [37:N/A] Granulation Quality: [24:Pink, Pale] [35:N/A] [37:N/A] Necrotic Amount: [24:Medium (34-66%)] [35:N/A] [37:N/A] Exposed Structures: [24:Fat Layer (Subcutaneous Tissue) Exposed: Yes Fascia: No Tendon: No Muscle: No Joint: No Bone: No] [35:N/A] [37:N/A] Epithelialization: [24:Small (1-33%)] [35:N/A] [37:N/A] Debridement: [24:Debridement - Excisional] [35:N/A] [37:N/A] Pre-procedure [24:08:16] [35:N/A] [37:N/A] Verification/Time Out Taken: Tissue Debrided: [24:Subcutaneous, Slough] [35:N/A] [37:N/A] Level: [24:Skin/Subcutaneous Tissue] [35:N/A] [37:N/A] Debridement Area (sq cm):10.5 [35:N/A] [37:N/A] Instrument: [24:Curette] [35:N/A] [37:N/A] Bleeding: [24:Minimum] [35:N/A]  [37:N/A] Hemostasis Achieved: [24:Pressure] [35:N/A] [37:N/A] Procedural Pain: [24:0] [35:N/A] [37:N/A] Post Procedural Pain: [24:0] [35:N/A] [37:N/A] Debridement Treatment Procedure was tolerated [35:N/A] [37:N/A] Response: [24:well] Post Debridement [24:3x3.5x0.5] [35:N/A] [37:N/A] Measurements L x W x D (cm) Post Debridement [id number] [35:N/A] [37:N/A] Volume: (cm) Procedures Performed: Debridement [35:N/A] [37:N/A] Treatment  Notes Electronic Signature(s) Signed: 01/14/2020 5:55:43 PM By: Linton Ham MD Signed: 01/17/2020 8:56:01 AM By: Levan Hurst RN, BSN Entered By: Linton Ham on 01/14/2020 08:25:38 -------------------------------------------------------------------------------- Multi-Disciplinary Care Plan Details Patient Name: Date of Service: CRIBBEugean, Stake 01/14/2020 7:30 AM Medical Record [id number] Patient Account Number: [id number] Date of Birth/Sex: Treating RN: [birth year] (34 y.o. [name] Primary Care Agron Swiney: [name], [name] Other Clinician: Referring Joslyne Marshburn: Treating Sinclaire Artiga/Extender:Robson, [name], [name]: 210 Active Inactive Wound/Skin Impairment Nursing Diagnoses: Impaired tissue integrity Knowledge deficit related to ulceration/compromised skin integrity Goals: Patient/caregiver will verbalize understanding of skin care regimen Date Initiated: 01/05/2016 Target Resolution Date: 02/15/2020 Goal Status: Active Ulcer/skin breakdown will have a volume reduction of 30% by week 4 Date Initiated: 01/05/2016 Date Inactivated: 12/22/2017 Target Resolution Date: 01/19/2018 Unmet Reason: complex Goal Status: Unmet wounds, infection Interventions: Assess patient/caregiver ability to obtain necessary supplies Assess ulceration(s) every visit Provide education on ulcer and skin care Notes: Electronic Signature(s) Signed: 01/17/2020 8:56:01 AM By: Levan Hurst RN, BSN Entered By: Levan Hurst on 01/14/2020  07:58:59 -------------------------------------------------------------------------------- Pain Assessment Details Patient Name: Date of Service: Looney, [name] 01/14/2020 7:30 AM Medical Record [id number] Patient Account Number: [id number] Date of Birth/Sex: Treating RN: [birth year] (34 y.o. [name] Primary Care Yuma Pacella: [place], [place] Other Clinician: Referring Jashawna Reever: Treating Docia Klar/Extender:Robson, [name], [name]: 210 Active Problems Location of Pain Severity and Description of Pain Patient Has Paino No Site Locations Rate the pain. Current Pain Level: 0 Pain Management and Medication Current Pain Management: Medication: No Cold Application: No Rest: No Massage: No Activity: No T.E.N.S.: No Heat Application: No Leg drop or elevation: No Is the Current Pain Management Adequate: Adequate How does your wound impact your activities of daily livingo Sleep: No Bathing: No Appetite: No Relationship With Others: No Bladder Continence: No Emotions: No Bowel Continence: No Work: No Toileting: No Drive: No Dressing: No Hobbies: No Electronic Signature(s) Signed: 01/14/2020 5:30:41 PM By: Deon Pilling Entered By: Deon Pilling on 01/14/2020 07:51:26 -------------------------------------------------------------------------------- Patient/Caregiver Education Details Patient Name: Date of Service: [name] 2/22/2021andnbsp7:30 AM Medical Record [id number] Patient Account Number: [id number] Date of Birth/Gender: [birth year] (33 y.o. M) Treating RN: Levan Hurst Primary Care Physician: Janine Limbo Other Clinician: Referring Physician: Treating Physician/Extender:Robson, Pecola Leisure in Treatment: 210 Education Assessment Education Provided To: Patient Education Topics Provided Wound/Skin Impairment: Methods: Explain/Verbal Responses: State content correctly Electronic Signature(s) Signed:  01/17/2020 8:56:01 AM By: Levan Hurst RN, BSN Entered By: Levan Hurst on 01/14/2020 08:00:03 -------------------------------------------------------------------------------- Wound Assessment Details Patient Name: Date of Service: Spates, [name] 01/14/2020 7:30 AM Medical Record [id number] Patient Account Number: [id number] Date of Birth/Sex: Treating RN: September 19, 1986 (34 y.o. [name] Primary Care Clarice Bonaventure: [place], [place] Other Clinician: Referring Nevaeha Finerty: Treating Jovanie Verge/Extender:Robson, [name], [name]: 210 Wound Status Wound Number: 24 Primary Etiology: Inflammatory Wound Location: Left Foot - Dorsal Wound Status: Open Wounding Event: Gradually Appeared Comorbid Sleep Apnea, Hypertension, History: Paraplegia Date Acquired: 03/08/2018 Weeks Of Treatment: 96 Clustered Wound: Yes Photos Wound Measurements Length: (cm) 2.9 % Reduct Width: (cm) 2.5 % Reduct Depth: (cm) 0.3 Epitheli Clustered Quantity: 1 Tunnelin Area: (cm) 5.694 Undermi Volume: (cm) 1.708 Wound Description Full Thickness Without Exposed Support Foul Odo Classification: Structures Slough/F Wound Distinct, outline attached Margin: Exudate Large Amount: Exudate Serosanguineous Type: Exudate Exudate red, brown Color: Wound Bed Granulation Amount: Small (1-33%) Granulation Quality: Red Fascia Expos Necrotic Amount: Large (67-100%) Fat Layer (S Necrotic Quality: Adherent Slough Tendon Expos  Muscle Expos Joint Expose Bone Exposed r After Cleansing: No ibrino Yes Exposed Structure ed: No ubcutaneous Tissue) Exposed: Yes ed: No ed: No d: No : No ion in Area: -2312.7% ion in Volume: -7016.7% alization: None g: No ning: No Treatment Notes Wound #24 (Left, Dorsal Foot) 1. Cleanse With Wound Cleanser Soap and water 2. Periwound Care Antifungal cream Moisturizing lotion TCA Cream 3. Primary Dressing Applied Polymem Ag 4. Secondary  Dressing Dry Gauze 6. Support Layer Applied 3 layer compression wrap Notes netting. Electronic Signature(s) Signed: 01/14/2020 4:33:06 PM By: Mikeal Hawthorne EMT/HBOT Signed: 01/17/2020 8:56:01 AM By: Levan Hurst RN, BSN Entered By: Mikeal Hawthorne on 01/14/2020 15:36:41 -------------------------------------------------------------------------------- Wound Assessment Details Patient Name: Date of Service: Dunigan, [name] 01/14/2020 7:30 AM Medical Record [id number] Patient Account Number: [id number] Date of Birth/Sex: Treating RN: 12-12-1985 (34 y.o. [name] Primary Care Teretha Chalupa: [place], [place] Other Clinician: Referring Nieko Clarin: Treating Marcena Dias/Extender:Robson, [name], [name]: 210 Wound Status Wound Number: 35 Primary Etiology: Pressure Ulcer Wound Location: Left Ischium Wound Status: Open Wounding Event: Gradually Appeared Comorbid Sleep Apnea, Hypertension, History: Paraplegia Date Acquired: 04/28/2019 Weeks Of Treatment: 37 Clustered Wound: Yes Photos Wound Measurements Length: (cm) 4.2 Width: (cm) 1.4 Depth: (cm) 0.1 Clustered Quantity: 1 Area: (cm) 4.618 Volume: (cm) 0.462 Wound Description Classification: Category/Stage III Wound Margin: Flat and Intact Exudate Amount: Medium Exudate Type: Serosanguineous Exudate Color: red, brown Wound Bed Granulation Amount: Large (67-100%) Granulation Quality: Red Necrotic Amount: None Present (0%) fter Cleansing: No ino No Exposed Structure ed: No ubcutaneous Tissue) Exposed: Yes ed: No ed: No d: No : No % Reduction in Area: 67.3% % Reduction in Volume: 67.3% Epithelialization: Small (1-33%) Tunneling: No Undermining: No Foul Odor A Slough/Fibr Fascia Expos Fat Layer (S Tendon Expos Muscle Expos Joint Expose Bone Exposed Treatment Notes Wound #35 (Left Ischium) 1. Cleanse With Wound Cleanser 3. Primary Dressing Applied Polymem Ag 4. Secondary  Dressing Foam Border Dressing 5. Secured With Office manager) Signed: 01/14/2020 4:33:06 PM By: Mikeal Hawthorne EMT/HBOT Signed: 01/17/2020 8:56:01 AM By: Levan Hurst RN, BSN Entered By: Mikeal Hawthorne on 01/14/2020 15:44:03 -------------------------------------------------------------------------------- Wound Assessment Details Patient Name: Date of Service: Shartzer, [name] 01/14/2020 7:30 AM Medical Record [id number] Patient Account Number: [id number] Date of Birth/Sex: Treating RN: 1986-06-26 (34 y.o. [name] Primary Care Kenny Rea: [place], [place] Other Clinician: Referring Bryanna Yim: Treating Rhya Shan/Extender:Robson, [name], [name]: 210 Wound Status Wound Number: 37 Primary Etiology: Venous Leg Ulcer Wound Location: Left Malleolus - Lateral Wound Status: Open Wounding Event: Gradually Appeared Comorbid Sleep Apnea, Hypertension, History: Paraplegia Date Acquired: 11/26/2019 Weeks Of Treatment: 7 Clustered Wound: No Photos Wound Measurements Length: (cm) 2.8 % Reduct Width: (cm) 1.1 % Reduct Depth: (cm) 0.1 Epitheli Area: (cm) 2.419 Tunneli Volume: (cm) 0.242 Undermi Wound Description Classification: Full Thickness Without Exposed Support Foul Odo Structures Slough/F Wound Distinct, outline attached Margin: Exudate Medium Amount: Exudate Serosanguineous Type: Exudate red, brown Color: Wound Bed Granulation Amount: None Present (0%) Necrotic Amount: Large (67-100%) Fascia E Necrotic Quality: Adherent Slough Fat Laye Tendon E Muscle E Joint Ex Bone Exp r After Cleansing: No ibrino Yes Exposed Structure xposed: No r (Subcutaneous Tissue) Exposed: Yes xposed: No xposed: No posed: No osed: No ion in Area: 38.9% ion in Volume: 38.9% alization: Medium (34-66%) ng: No ning: No Treatment Notes Wound #37 (Left, Lateral Malleolus) 1. Cleanse With Wound Cleanser Soap and water 2.  Periwound Care Antifungal cream Moisturizing lotion TCA Cream 3.  Primary Dressing Applied Polymem Ag 4. Secondary Dressing Dry Gauze 6. Support Layer Applied 3 layer compression wrap Notes netting. Electronic Signature(s) Signed: 01/14/2020 4:33:06 PM By: Mikeal Hawthorne EMT/HBOT Signed: 01/17/2020 8:56:01 AM By: Levan Hurst RN, BSN Entered By: Mikeal Hawthorne on 01/14/2020 15:26:44 -------------------------------------------------------------------------------- Wound Assessment Details Patient Name: Date of Service: Lehr, [name] 01/14/2020 7:30 AM Medical Record [id number] Patient Account Number: [id number] Date of Birth/Sex: Treating RN: 10-Jun-1986 (34 y.o. [name] Primary Care Takeia Ciaravino: [name], [name] Other Clinician: Referring Phoenicia Pirie: Treating Silverio Hagan/Extender:Robson, [name], [name]: 210 Wound Status Wound Number: 38 Primary Etiology: Inflammatory Wound Location: Right Toe - Web between 1st and Wound Status: Open 2nd Comorbid Sleep Apnea, Hypertension, Wounding Event: Gradually Appeared History: Paraplegia Date Acquired: 11/30/2019 Weeks Of Treatment: 6 Clustered Wound: No Photos Wound Measurements Length: (cm) 3 % Reduction Width: (cm) 3.5 % Reduct Depth: (cm) 0.5 Epitheli Area: (cm) 8.247 Tunneli Volume: (cm) 4.123 Undermi Wound Description Classification: Full Thickness Without Exposed Support Foul Odo Structures Slough/F Wound Distinct, outline attached Margin: Exudate Medium Amount: Exudate Serosanguineous Type: Exudate red, brown Color: Wound Bed Granulation Amount: Medium (34-66%) Granulation Quality: Pink, Pale Fascia E Necrotic Amount: Medium (34-66%) Fat Laye Necrotic Quality: Adherent Slough Tendon E Muscle E Joint Ex Bone Exp r After Cleansing: No ibrino Yes Exposed Structure xposed: No r (Subcutaneous Tissue) Exposed: Yes xposed: No xposed: No posed: No osed: No in Area:  -2399.1% ion in Volume: -1684.8% alization: Small (1-33%) ng: No ning: No Treatment Notes Wound #38 (Right Toe - Web between 1st and 2nd) 1. Cleanse With Wound Cleanser Soap and water 2. Periwound Care Antifungal cream Moisturizing lotion TCA Cream 3. Primary Dressing Applied Calcium Alginate Ag 4. Secondary Dressing Dry Gauze Roll Gauze 5. Secured With [employer] tape Notes Development worker, international aid) Signed: 01/14/2020 4:33:06 PM By: Mikeal Hawthorne EMT/HBOT Signed: 01/17/2020 8:56:01 AM By: Levan Hurst RN, BSN Entered By: Mikeal Hawthorne on 01/14/2020 15:25:52 -------------------------------------------------------------------------------- Vitals Details Patient Name: Date of Service: Hagin, Karam E. 01/14/2020 7:30 AM Medical Record [id number] Patient Account Number: [id number] Date of Birth/Sex: Treating RN: 1986-10-18 (34 y.o. [name] Primary Care Damara Klunder: [name], [name] Other Clinician: Referring Theona Muhs: Treating Anya Murphey/Extender:Robson, [name], [name]: 210 Vital Signs Time Taken: 07:50 Temperature (F): 98.3 Height (in): 70 Pulse (bpm): 120 Weight (lbs): 216 Respiratory Rate (breaths/min): 20 Body Mass Index (BMI): 31 Blood Pressure (mmHg): 130/60 Reference Range: 80 - 120 mg / dl Electronic Signature(s) Signed: 01/14/2020 5:30:41 PM By: Deon Pilling Entered By: Deon Pilling on 01/14/2020 07:51:48

## 2020-01-21 ENCOUNTER — Encounter (HOSPITAL_BASED_OUTPATIENT_CLINIC_OR_DEPARTMENT_OTHER): Payer: BC Managed Care – PPO | Attending: Internal Medicine | Admitting: Internal Medicine

## 2020-01-21 ENCOUNTER — Other Ambulatory Visit: Payer: Self-pay

## 2020-01-21 DIAGNOSIS — Z87891 Personal history of nicotine dependence: Secondary | ICD-10-CM | POA: Insufficient documentation

## 2020-01-21 DIAGNOSIS — I87312 Chronic venous hypertension (idiopathic) with ulcer of left lower extremity: Secondary | ICD-10-CM | POA: Diagnosis not present

## 2020-01-21 DIAGNOSIS — Z881 Allergy status to other antibiotic agents status: Secondary | ICD-10-CM | POA: Diagnosis not present

## 2020-01-21 DIAGNOSIS — Z882 Allergy status to sulfonamides status: Secondary | ICD-10-CM | POA: Diagnosis not present

## 2020-01-21 DIAGNOSIS — L03116 Cellulitis of left lower limb: Secondary | ICD-10-CM | POA: Insufficient documentation

## 2020-01-21 DIAGNOSIS — L97321 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 to breakdown of skin: Secondary | ICD-10-CM | POA: Insufficient documentation

## 2020-01-21 DIAGNOSIS — Z880 Allergy status to penicillin: Secondary | ICD-10-CM | POA: Diagnosis not present

## 2020-01-21 DIAGNOSIS — L97522 Non-pressure chronic ulcer of other part of left foot with fat layer exposed: Secondary | ICD-10-CM | POA: Diagnosis not present

## 2020-01-21 DIAGNOSIS — L89322 Pressure ulcer of left buttock, stage 2: Secondary | ICD-10-CM | POA: Diagnosis not present

## 2020-01-21 DIAGNOSIS — L97322 Non-pressure chronic ulcer of left 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: Secondary | ICD-10-CM | POA: Diagnosis not present

## 2020-01-21 DIAGNOSIS — L97521 Non-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: Secondary | ICD-10-CM | POA: Insufficient documentation

## 2020-01-21 DIAGNOSIS — Z89422 Acquired absence of other left toe(s): Secondary | ICD-10-CM | POA: Insufficient documentation

## 2020-01-21 DIAGNOSIS — G8221 Paraplegia, complete: Secondary | ICD-10-CM | POA: Insufficient documentation

## 2020-01-21 DIAGNOSIS — L89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, stage 3: Secondary | ICD-10-CM | POA: Insufficient documentation

## 2020-01-21 DIAGNOSIS — L0889 Other specified local infections of the skin and subcutaneous tissue: Secondary | ICD-10-CM | POA: Diagnosis not present

## 2020-01-21 DIAGNOSIS — L97511 Non-pressure chronic ulcer of other part of right foot limited to breakdown of skin: Secondary | ICD-10-CM | POA: Diagnosis not present

## 2020-01-21 NOTE — Progress Notes (Signed)
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 Hydrofera Blue. I believe that there is expanding epithelialization over the surface of the wound albeit not coming from the wound circumference. This is a bit of an odd situation in which the epithelialization seems to be coming from the surface of the wound rather than in the exact circumference. There is still small open areas mostly along the lateral margin of the wound. He has unchanged areas between the left first and second and the right first second toes which I been treating for tenia pedis 06/02/17; left lateral leg venous insufficiency wound. We have been using Hydrofera Blue. Somewhat smaller from the wound circumference. The surface of the wound remains a bit on it almost epithelialized sedation in appearance. I use an open curette today debridement in the surface of all of this especially the edges Small open wounds remaining on the dorsal right first and second toe interspace and the plantar left first second toe and her face on the left 06/09/17; wound on the left lateral leg continues to be smaller but very gradual and very dry surface using Hydrofera Blue 06/16/17 requires  weekly debridements now on the left lateral leg although this continues to contract. I changed to silver collagen last week because of dryness of the wound bed. Using Iodoflex to the areas on his first and second toes/web space bilaterally 06/24/17;  patient with history of paraplegia also chronic venous insufficiency with lymphedema. Has a very difficult wound on the left lateral leg. This has been gradually reducing in terms of with but comes in with a very dry adherent surface. High switch to silver collagen a week or so ago with hydrogel to keep the area moist. This is been refractory to multiple dressing attempts. He also has areas in his first and second toes bilaterally in the anterior and posterior web space. I had been using Iodoflex here after a prolonged course of silver alginate with ketoconazole was ineffective [question tinea pedis] 07/14/17; patient arrives today with a very difficult adherent material over his left lateral lower leg wound. He also has surrounding erythema and poorly controlled edema. He was switched his Santyl last visit which the nurses are applying once during his doctor visit and once on a nurse visit. He was also reduced to 2 layer compression I'm not exactly sure of the issue here. 07/21/17; better surface today after 1 week of Iodoflex. Significant cellulitis that we treated last week also better. [Doxycycline] 07/28/17 better surface today with now 2 weeks of Iodoflex. Significant cellulitis treated with doxycycline. He has now completed the doxycycline and he is back to his usual degree of chronic venous inflammation/stasis dermatitis. He reminds me he has had ablations surgery here 08/04/17; continued improvement with Iodoflex to the left lateral leg wound in terms of the surface of the wound although the dimensions are better. 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 Problematic areas on  the plantar aspect of the first second toe web space on the left and the dorsal aspect of the first second toe web space on the right. At one point I felt these were probably related to chronic fungal infections in treated him aggressively for this although we have not made any improvement here. 08/11/17; left lateral leg. Surface continues to improve with the Iodoflex although we are not seeing much improvement in overall wound dimensions. Areas on his plantar left foot and right foot show no improvement. In fact the right foot looks somewhat worse 08/18/17; left lateral leg. We changed to [facility] Blue last week after a prolonged course of Iodoflex which helps get the surface better. It appears that the wound with is improved. Continue with difficult areas on the left dorsal first second and plantar first second on the right 09/01/17; patient arrives in clinic today having had a temperature of 103 yesterday. He was seen in the ER and [facility]. The patient was concerned he could have cellulitis again in the right leg however they diagnosed him with a UTI and he is now on Keflex. He has a history of cellulitis which is been recurrent and difficult but this is been in the left leg, in the past 5 use doxycycline. He does in and out catheterizations at home which are risk factors for UTI 09/08/17; patient will be completing his Keflex this weekend. The erythema on the left leg is considerably better. He has a new wound today on the medial part of the right leg small superficial almost looks like a skin tear. He has worsening of the area on the right dorsal first and second toe. His major area on the left lateral leg is better. Using Hydrofera Blue on all areas 09/15/17; gradual reduction in width on the long wound in the left lateral leg. No debridement required. He also has wounds on the plantar aspect  of his left first second toe web space and on the dorsal aspect of the right first second toe web  space. 09/22/17; there continues to be very gradual improvements in the dimensions of the left lateral leg wound. He hasn't round erythematous spot with might be pressure on his wheelchair. There is no evidence obviously of infection no purulence no warmth He has a dry scaled area on the plantar aspect of the left first second toe Improved area on the dorsal right first second toe. 09/29/17; left lateral leg wound continues to improve in dimensions mostly with an is still a fairly long but increasingly narrow wound. He has a dry scaled area on the plantar aspect of his left first second toe web space Increasingly concerning area on the dorsal right first second toe. In fact I am concerned today about possible cellulitis around this wound. The areas extending up his second toe and although there is deformities here almost appears to abut on the nailbed. 10/06/17; left lateral leg wound continues to make very gradual progress. Tissue culture I did from the right first second toe dorsal foot last time grew MRSA and enterococcus which was vancomycin sensitive. This was not sensitive to clindamycin or doxycycline. He is allergic to Zyvox and sulfa we have therefore arrange for him to have dalvance infusion tomorrow. He is had this in the past and tolerated it well 10/20/17; left lateral leg wound continues to make decent progress. This is certainly reduced in terms of with there is advancing epithelialization.The cellulitis in the right foot looks better although he still has a deep wound in the dorsal aspect of the first second toe web space. Plantar left first toe web space on the left I think is making some progress 10/27/17; left lateral leg wound continues to make decent progress. Advancing epithelialization.using Hydrofera Blue The right first second toe web space wound is better-looking using silver alginate Improvement in the left plantar first second toe web space. Again using silver  alginate 11/03/17 left lateral leg wound continues to make decent progress albeit slowly. Using [facility] The right per second toe web space continues to be a very problematic looking punched out wound. I obtained a piece of tissue for deep culture I did extensively treated this for fungus. It is difficult to imagine that this is a pressure area as the patient states other than going outside he doesn't really wear shoes at home The left plantar first second toe web space looked fairly senescent. Necrotic edges. This required debridement change to [facility] Blue to all wound areas 11/10/17; left lateral leg wound continues to contract. Using Hydrofera Blue On the right dorsal first second toe web space dorsally. Culture I did of this area last week grew MRSA there is not an easy oral option in this patient was multiple antibiotic allergies or intolerances. This was only a rare culture isolate I'm therefore going to use Bactroban under silver alginate On the left plantar first second toe web space. Debridement is required here. This is also unchanged 11/17/17; left lateral leg wound continues to contract using Hydrofera Blue this is no longer the major issue. The major concern here is the right first second toe web space. He now has an open area going from dorsally to the plantar aspect. There is now wound on the inner lateral part of the first toe. Not a very viable surface on this. There is erythema spreading medially into the forefoot. No major change in the left first second  toe plantar wound 11/24/17; left lateral leg wound continues to contract using Hydrofera Blue. Nice improvement today The right first second toe web space all of this looks a lot less angry than last week. I have given him clindamycin and topical Bactroban for MRSA and terbinafine for the possibility of underlining tinea pedis that I could not control with ketoconazole. Looks somewhat better The area on the plantar left  first second toe web space is weeping with dried debris around the wound 12/01/17; left lateral leg wound continues to contract he Hydrofera Blue. It is becoming thinner in terms of with nevertheless it is making good improvement. The right first second toe web space looks less angry but still a large necrotic-looking wounds starting on the plantar aspect of the right foot extending between the toes and now extensively on the base of the right second toe. I gave him clindamycin and topical Bactroban for MRSA anterior benefiting for the possibility of underlying tinea pedis. Not looking better today The area on the left first/second toe looks better. Debrided of necrotic debris 12/05/17* the patient was worked in urgently today because over the weekend he found blood on his incontinence bad when he woke up. He was found to have an ulcer by his wife who does most of his wound care. He came in today for [place] to look at this. He has not had a history of wounds in his buttocks in spite of his paraplegia. 12/08/17; seen in follow-up today at his usual appointment. He was seen earlier this week and found to have a new wound on his buttock. We also follow him for wounds on the left lateral leg, left first second toe web space and right first second toe web space 12/15/17; we have been using Hydrofera Blue to the left lateral leg which has improved. The right first second toe web space has also improved. Left first second toe web space plantar aspect looks stable. The left buttock has worsened using Santyl. Apparently the buttock has drainage 12/22/17; we have been using Hydrofera Blue to the left lateral leg which continues to improve now 2 small wounds separated by normal skin. He tells [place] he had a fever up to 100 yesterday he is prone to UTIs but has not noted anything different. He does in and out catheterizations. The area between the first and second toes today does not look good necrotic surface covered  with what looks to be purulent drainage and erythema extending into the third toe. I had gotten this to something that I thought look better last time however it is not look good today. He also has a necrotic surface over the buttock wound which is expanded. I thought there might be infection under here so I removed a lot of the surface with a #5 curet though nothing look like it really needed culturing. He is been using Santyl to this area 12/27/17; his original wound on the left lateral leg continues to improve using Hydrofera Blue. I gave him samples of Baxdella although he was unable to take them out of fear for an allergic reaction ["lump in his throat"].the culture I did of the purulent drainage from his second toe last week showed both enterococcus and a set Enterobacter I was also concerned about the erythema on the bottom of his foot although paradoxically although this looks somewhat better today. 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  We've been using silver alginate here. They came up today that he sleeps in a recliner, I'm not sure why but I asked him to stop this 01/03/18; his original wound we've been using Hydrofera Blue is now separated into 2 areas. Ulcer on his left buttock is better he is off the recliner and sleeping in bed Finally both wound areas between his first and second toes also looks some better 01/10/18; his original wound on the left lateral leg is now separated into 2 wounds we've been using Hydrofera Blue Ulcer on his left buttock has some drainage. There is a small probing site going into muscle layer superiorly.using silver alginate -He arrives today with a deep tissue injury on the left heel The wound on the dorsal aspect of his first second toe on the left looks a lot betterusing silver alginate ketoconazole The area on the first second toe web space on the right  also looks a lot bette 01/17/18; his original wound on the left lateral leg continues to progress using Hydrofera Blue Ulcer on his left buttock also is smaller surface healthier except for a small probing site going into the muscle layer superiorly. 2.4 cm of tunneling in this area DTI on his left heel we have only been offloading. Looks better than last week no threatened open no evidence of infection the wound on the dorsal aspect of the first second toe on the left continues to look like it's regressing we have only been using silver alginate and terbinafine orally The area in the first second toe web space on the right also looks to be a lot better using silver alginate and terbinafine I think this was prompted by tinea pedis 01/31/18; the patient was hospitalized in [place] last week apparently for a complicated UTI. He was discharged on cefepime he does in and out catheterizations. In the hospital he was discovered M I don't mild elevation of AST and ALTs and the terbinafine was stopped.predictably the pressure ulcer on his buttock looks betterusing silver alginate. The area on the left lateral leg also is better using Hydrofera Blue. The area between the first and second toes on the left better. First and second toes on the right still substantial but better. Finally the DTI on the left heel has held together and looks like it's resolving 02/07/18-he is here in follow-up evaluation for multiple ulcerations. He has new injury to the lateral aspect of the last issue a pressure ulcer, he states this is from adhesive removal trauma. He states he has tried multiple adhesive products with no success. All other ulcers appear stable. The left heel DTI is resolving. We will continue with same treatment plan and follow-up next week. 02/14/18; follow-up for multiple areas. He has a new area last week on the lateral aspect of his pressure ulcer more over the posterior trochanter. The original pressure  ulcer looks quite stable has healthy granulation. 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 Almost fully epithelialized on the original superior area using Hydrofera Blue DTI on the left heel has peeled off this week to reveal a small superficial wound under denuded skin and subcutaneous tissue Both areas between the first and second toes look better including nothing open on the left 02/21/18; The patient's wounds on his left ischial tuberosity and posterior left greater trochanter actually looked better. He has a large area of irritation around the area which I  think is contact dermatitis. I am doubtful that this is fungal His original wound on the left lateral calf continues to improve we have been using Hydrofera Blue There is no open area in the left first second toe web space although there is a lot of thick callus The DTI on the left heel required debridement today of necrotic surface eschar and subcutaneous tissue using silver alginate Finally the area on the right first second toe webspace continues to contract using silver alginate and ketoconazole 02/28/18 Left ischial tuberosity wounds look better using silver alginate. Original wound on the left calf only has one small open area left using Hydrofera Blue DTI on the left heel required debridement mostly removing skin from around this wound surface. Using silver alginate The areas on the right first/second toe web space using silver alginate and ketoconazole 03/08/18 on evaluation today patient appears to be doing decently well as best I can tell in regard to his wounds. This is the first time that I have seen him as he generally is followed by Dr. Dellia Nims. With that being said none of his wounds appear to be infected he does have an area where there is some skin covering what appears to be a new wound on the left dorsal surface of  his great toe. This is right at the nail bed. With that being said I do believe that debrided away some of the excess skin can be of benefit in this regard. Otherwise he has been tolerating the dressing changes without complication. 03/14/18; patient arrives today with the multiplicity of wounds that we are following. He has not been systemically unwell Original wound on the left lateral calf now only has 2 small open areas we've been using Hydrofera Blue which should continue The deep tissue injury on the left heel requires debridement today. We've been using silver alginate The left first second toe and the right first second toe are both are reminiscence what I think was tinea pedis. Apparently some of the callus Surface between the toes was removed last week when it started draining. Purulent drainage coming from the wound on the ischial tuberosity on the left. 03/21/18-He is here in follow-up evaluation for multiple wounds. 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 He tolerated debridement. The only change to last week's recommendations is to discontinue antifungal cream between toes. He will follow-up next week 03/28/18; following up for multiple wounds;Concern this week is streaking redness and swelling in the right foot. He is going to need antibiotics for this. 03/31/18; follow-up for right foot cellulitis. Streaking redness and swelling in the right foot on 03/28/18. 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 I put him on clindamycin 300 mg every 6 and brought him in for a quick check. He has an open wound between his first and second toes on the right foot as a potential source. 04/04/18; Right foot cellulitis is resolving he is completing clindamycin. This is truly good news Left lateral calf wound which is initial wound only has one small open area inferiorly this is close to healing out. He  has compression stockings. We will use Hydrofera Blue right down to the epithelialization of this Nonviable surface on the left heel which was initially pressure with a DTI. We've been using Hydrofera Blue. I'm going to switch this back to silver alginate Left first second toe/tinea pedis this looks better using silver alginate Right first second toe tinea  pedis using silver alginate Large pressure ulcers on theLeft ischial tuberosity. Small wound here Looks better. I am uncertain about the surface over the large wound. Using silver alginate 04/11/18; Cellulitis in the right foot is resolved Left lateral calf wound which was his original wounds still has 2 tiny open areas remaining this is just about closed Nonviable surface on the left heel is better but still requires debridement Left first second toe/tinea pedis still open using silver alginate Right first second toe wound tinea pedis I asked him to go back to using ketoconazole and silver alginate Large pressure ulcers on the left ischial tuberosity this shear injury here is resolved. Wound is smaller. No evidence of infection using silver alginate 04/18/18; Patient arrives with an intense area of cellulitis in the right mid lower calf extending into the right heel area. Bright red and warm. Smaller area on the left anterior leg. He has a significant history of MRSA. He will definitely need antibioticsdoxycycline He now has 2 open areas on the left ischial tuberosity the original large wound and now a satellite area which I think was above his initial satellite areas. Not a wonderful surface on this satellite area surrounding erythema which looks like pressure related. His left lateral calf wound again his original wound is just about closed Left heel pressure injury still requiring debridement Left first second toe looks a lot better using silver alginate Right first second toe also using silver alginate and ketoconazole cream also looks  better 04/20/18; the patient was worked in early today out of concerns with his cellulitis on the right leg. I had started him on doxycycline. This was 2 days ago. His wife was concerned about the swelling in the area. Also concerned about the left buttock. He has not been systemically unwell no fever chills. No nausea vomiting or diarrhea 04/25/18; the patient's left buttock wound is continued to deteriorate he is using Hydrofera Blue. He is still completing clindamycin for the cellulitis on the right leg although all of this looks better. 05/02/18 Left buttock wound still with a lot of drainage and a very tightly adherent fibrinous necrotic surface. He has a deeper area superiorly The left lateral calf wound is still closed DTI wound on the left heel necrotic surface especially the circumference using Iodoflex Areas between his left first second toe and right first second toe both look better. Dorsally and the right first second toe he had a necrotic surface although at smaller. In using silver alginate and ketoconazole. I did a culture last week which was a deep tissue culture of the reminiscence of the open wound on the right first second toe dorsally. This grew a few Acinetobacter and a few methicillin-resistant staph aureus. Nevertheless the area actually this week looked better. I didn't feel the need to specifically address this at least in terms of systemic antibiotics. 05/09/18; wounds are measuring larger more drainage per our intake. We are using Santyl covered with alginate on the large superficial buttock wounds, Iodosorb on the left heel, ketoconazole and silver alginate to the dorsal first and second toes bilaterally. 05/16/18; The area on his left buttock better in some aspects although the area superiorly over the ischial tuberosity required an extensive debridement.using Santyl Left heel appears stable. Using Iodoflex The areas between his first and second toes are not bad  however there is spreading erythema up the dorsal aspect of his left foot this looks like cellulitis again. He is insensate the erythema is really very brilliant.o  Erysipelas He went to see an allergist days ago because he was itching part of this he had lab work done. This showed a white count of 15.1 with 70% neutrophils. Hemoglobin of 11.4 and a platelet count of 659,000. Last white count we had in Epic was a 2-1/2 years ago which was 25.9 but he was ill at the time. He was able to show me some lab work that was done by his primary physician the pattern is about the same. 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 But prompted me to go ahead and do x-rays of both feet and the pelvis rule out osteomyelitis. He also had a comprehensive metabolic panel this was reasonably normal his albumin was 3.7 liver function tests BUN/creatinine all normal 05/23/18; x-rays of both his feet from last week were negative for underlying pulmonary abnormality. The x-ray of his pelvis however showed mild irregularity in the left ischial which may represent some early osteomyelitis. The wound in the left ischial continues to get deeper clearly now exposed muscle. Each week necrotic surface material over this area. Whereas the rest of the wounds do not look so bad. The left ischial wound we have been using Santyl and calcium alginate To the left heel surface necrotic debris using Iodoflex The left lateral leg is still healed Areas on the left dorsal foot and the right dorsal foot are about the same. There is some inflammation on the left which might represent contact dermatitis, fungal dermatitis I am doubtful cellulitis although this looks better than last week 05/30/18; CT scan done at Hospital did not show any osteomyelitis or abscess. Suggested the possibility of underlying cellulitis although I don't see a lot of evidence of this at the bedside The wound itself on the  left buttock/upper thigh actually looks somewhat better. No debridement Left heel also looks better no debridement continue Iodoflex Both dorsal first second toe spaces appear better using Lotrisone. Left still required debridement 06/06/18; Intake reported some purulent looking drainage from the left gluteal wound. Using Santyl and calcium alginate Left heel looks better although still a nonviable surface requiring debridement The left dorsal foot first/second webspace actually expanding and somewhat deeper. I may consider doing a shave biopsy of this area Right dorsal foot first/second webspace appears stable to improved. Using Lotrisone and silver alginate to both these areas 06/13/18 Left gluteal surface looks better. Now separated in the 2 wounds. No debridement required. Still drainage. We'll continue silver alginate Left heel continues to look better with Iodoflex continue this for at least another week Of his dorsal foot wounds the area on the left still has some depth although it looks better than last week. We've been using Lotrisone and silver alginate 06/20/18 Left gluteal continues to look better healthy tissue Left heel continues to look better healthy granulation wound is smaller. He is using Iodoflex and his long as this continues continue the Iodoflex Dorsal right foot looks better unfortunately dorsal left foot does not. There is swelling and erythema of his forefoot. He had minor trauma to this several days ago but doesn't think this was enough to have caused any tissue injury. Foot looks like cellulitis, we have had this problem before 06/27/18 on evaluation today patient appears to be doing a little worse in regard to his foot ulcer. 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 Both of which would really be his only options for treating  this infection. In the past he has been given and effusion of Orbactiv. This is done very well for him in the past again it's one time dosing IV antibiotic therapy. Subsequently I do believe this is something we're gonna need to see about doing at this point in time. Currently his other wounds seem to be doing somewhat better in my pinion I'm pretty happy in that regard. 07/03/18 on evaluation today patient's wounds actually appear to be doing fairly well. He has been tolerating the dressing changes without complication. All in all he seems to be showing signs of improvement. In regard to the antibiotics he has been dealing with infectious disease since I saw him last week as far as getting this scheduled. In the end [facility]'s [facility] this coming Friday. In the meantime he has been continuing to perform the dressing changes in such as previous. There does not appear to be any evidence of infection worsengin at this time. 07/10/18; Since I last saw this man 2 weeks ago things have actually improved. IV antibiotics of resulted in less forefoot erythema although there is still some present. He is not systemically unwell Left buttock wounds 2 now have no depth there is increased epithelialization Using silver alginate Left heel still requires debridement using Iodoflex Left dorsal foot still with a sizable wound about the size of a border but healthy granulation Right dorsal foot still with a slitlike area using silver alginate 07/18/18; the patient's cellulitis in the left foot is improved in fact I think it is on its way to resolving. Left buttock wounds 2 both look better although the larger one has hypertension granulation we've been using silver alginate Left heel has some thick circumferential redundant skin over the wound edge which will need to be removed today we've been using Iodoflex Left dorsal foot is still a sizable wound required  debridement using silver alginate The right dorsal foot is just about closed only a small open area remains here 07/25/18; left foot cellulitis is resolved Left buttock wounds 2 both look better. Hyper-granulation on the major area Left heel as some debris over the surface but otherwise looks a healthier wound. Using silver collagen Right dorsal foot is just about closed 07/31/18; arrives with our intake nurse worried about purulent drainage from the buttock. We had hyper-granulation here last week His buttock wounds 2 continue to look better Left heel some debris over the surface but measuring smaller. Right dorsal foot unfortunately has openings between the toes Left foot superficial wound looks less aggravated. 08/07/18 Buttock wounds continue to look better although some of her granulation and the larger medial wound. silver alginate Left heel continues to look a lot better.silver collagen Left foot superficial wound looks less stable. Requires debridement. He has a new wound superficial area on the foot on the lateral dorsal foot. Right foot looks better using silver alginate without Lotrisone 08/14/2018; patient was in the ER last week diagnosed with a UTI. He is now on Cefpodoxime and Macrodantin. Buttock wounds continued to be smaller. Using silver alginate Left heel continues to look better using silver collagen Left foot superficial wound looks as though it is improving Right dorsal foot area is just about healed. 08/21/2018; patient is completed his antibiotics for his UTI. He has 2 open areas on the buttocks. There  is still not closed although the surface looks satisfactory. Using silver alginate Left heel continues to improve using silver collagen The bilateral dorsal foot areas which are at the base of his first and second toes/possible tinea pedis are actually stable on the left but worse on the right. The area on the left required debridement of necrotic surface.  After debridement I obtained a specimen for PCR culture. The right dorsal foot which is been just about healed last week is now reopened 08/28/2018; culture done on the left dorsal foot showed coag negative staph both staph epidermidis and Lugdunensis. I think this is worthwhile initiating systemic treatment. I will use doxycycline given his long list of allergies. The area on the left heel slightly improved but still requiring debridement. The large wound on the buttock is just about closed whereas the smaller one is larger. Using silver alginate in this area 09/04/2018; patient is completing his doxycycline for the left foot although this continues to be a very difficult wound area with very adherent necrotic debris. We are using silver alginate to all his wounds right foot left foot and the small wounds on his buttock, silver collagen on the left heel. 09/11/2018; once again this patient has intense erythema and swelling of the left forefoot. Lesser degrees of erythema in the right foot. He has a long list of allergies and intolerances. I will reinstitute doxycycline. 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 Using silver alginate Left heel also looks better using silver collagen Unfortunately both the areas on his feet look worse. The area on the left first second webspace is now gone through to the plantar part of his foot. The area on the left foot anteriorly is irritated with erythema and swelling in the forefoot. 09/25/2018 His wound on the left plantar heel looks better. Using silver collagen The area on the left buttock 2 small remnant areas. One is closed one is still open. Using silver alginate The areas between both his first and second toes look worse. This in spite of long-standing antifungal therapy with ketoconazole and silver alginate which should have antifungal activity He has small areas around his original wound on the left  calf one is on the bottom of the original scar tissue and one superiorly both of these are small and superficial but again given wound history in this site this is worrisome 10/02/2018 Left plantar heel continues to gradually contract using silver collagen Left buttock wound is unchanged using silver alginate The areas on his dorsal feet between his first and second toes bilaterally look about the same. I prescribed clindamycin ointment to see if we can address chronic staph colonization and also the underlying possibility of erythrasma The left lateral lower extremity wound is actually on the lateral part of his ankle. Small open area here. We have been using silver alginate 10/09/2018; Left plantar heel continues to look healthy and contract. No debridement is required Left buttock slightly smaller with a tape injury wound just below which was new this week Dorsal feet somewhat improved I have been using clindamycin Left lateral looks lower extremity the actual open area looks worse although a lot of this is epithelialized. I am going to change to silver collagen today He has a lot more swelling in the right leg although this is not pitting not red and not particularly warm there is a lot of spasm in the right leg usually indicative of people with  paralysis of some underlying discomfort. We have reviewed his vascular status from 2017 he had a left greater saphenous vein ablation. I wonder about referring him back to vascular surgery if the area on the left leg continues to deteriorate. 10/16/2018 in today for follow-up and management of multiple lower extremity ulcers. His left Buttock wound is much lower smaller and almost closed completely. The wound to the left ankle has began to reopen with Epithelialization and some adherent slough. He has multiple new areas to the left foot and leg. The left dorsal foot without much improvement. Wound present between left great webspace and 2nd toe.  Erythema and edema present right leg. Right LE ultrasound obtained on 10/10/18 was negative for DVT. 10/23/2018; Left buttock is closed over. Still dry macerated skin but there is no open wound. I suspect this is chronic pressure/moisture Left lateral calf is quite a bit worse than when I saw this last. There is clearly drainage here he has macerated skin into the left plantar heel. We will change the primary dressing to alginate Left dorsal foot has some improvement in overall wound area. Still using clindamycin and silver alginate Right dorsal foot about the same as the left using clindamycin and silver alginate The erythema in the right leg has resolved. He is DVT rule out was negative Left heel pressure area required debridement although the wound is smaller and the surface is health 10/26/2018 The patient came back in for his nurse check today predominantly because of the drainage coming out of the left lateral leg with a recent reopening of his original wound on the left lateral calf. He comes in today with a large amount of surrounding erythema around the wound extending from the calf into the ankle and even in the area on the dorsal foot. He is not systemically unwell. He is not febrile. Nevertheless this looks like cellulitis. We have been using silver alginate to the area. I changed him to a regular visit and I am going to prescribe him doxycycline. The rationale here is a long list of medication intolerances and a history of MRSA. I did not see anything that I thought would provide a valuable culture 10/30/2018 Follow-up from his appointment 4 days ago with really an extensive area of cellulitis in the left calf left lateral ankle and left dorsal foot. I put him on doxycycline. He has a long list of medication allergies which are true allergy reactions. 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 In any case he is a  lot better today. The erythema especially in the anterior and lateral left calf is better. He still has left ankle erythema. He also is complaining about increasing edema in the right leg we have only been using Kerlix Coban and he has been doing the wraps at home. Finally he has a spotty rash on the medial part of his upper left calf which looks like folliculitis or perhaps wrap occlusion type injury. Small superficial macules not pustules 11/06/18 patient arrives today with again a considerable degree of erythema around the wound on the left lateral calf extending into the dorsal ankle and dorsal foot. This is a lot worse than when I saw this last week. He is on doxycycline really with not a lot of improvement. He has not been systemically unwell Wounds on the; left heel actually looks improved. Original area on the left foot and proximity to the first and second toes  looks about the same. He has superficial areas on the dorsal foot, anterior calf and then the reopening of his original wound on the left lateral calf which looks about the same The only area he has on the right is the dorsal webspace first and second which is smaller. He has a large area of dry erythematous skin on the left buttock small open area here. 11/13/2018; the patient arrives in much better condition. The erythema around the wound on the left lateral calf is a lot better. Not sure whether this was the clindamycin or the TCA and ketoconazole or just in the improvement in edema control [stasis dermatitis]. In any case this is a lot better. The area on the left heel is very small and just about resolved using silver collagen we have been using silver alginate to the areas on his dorsal feet 11/20/2018; his wounds include the left lateral calf, left heel, dorsal aspects of both feet just proximal to the first second webspace. He is stable to slightly improved. I did not think any changes to his dressings were going to  be necessary 11/27/2018 he has a reopening on the left buttock which is surrounded by what looks like tinea or perhaps some other form of dermatitis. The area on the left dorsal foot has some erythema around it I have marked this area but I am not sure whether this is cellulitis or not. Left heel is not closed. Left calf the reopening is really slightly longer and probably worse 1/13; in general things look better and smaller except for the left dorsal foot. Area on the left heel is just about closed, left buttock looks better only a small wound remains in the skin looks better [using Lotrisone] 1/20; the area on the left heel only has a few remaining open areas here. Left lateral calf about the same in terms of size, left dorsal foot slightly larger right lateral foot still not closed. The area on the left buttock has no open wound and the surrounding skin looks a lot better 1/27; the area on the left heel is closed. Left lateral calf better but still requiring extensive debridements. The area on his left buttock is closed. He still has the open areas on the left dorsal foot which is slightly smaller in the right foot which is slightly expanded. We have been using Iodoflex on these areas as well 2/3; left heel is closed. Left lateral calf still requiring debridement using Iodoflex there is no open area on his left buttock however he has dry scaly skin over a large area of this. Not really responding well to the Lotrisone. Finally the areas on his dorsal feet at the level of the first second webspace are slightly smaller on the right and about the same on the left. Both of these vigorously debrided with Anasept and gauze 2/10; left heel remains closed he has dry erythematous skin over the left buttock but there is no open wound here. Left lateral leg has come in and with. Still requiring debridement we have been using Iodoflex here. Finally the area on the left dorsal foot and right dorsal foot are  really about the same extremely dry callused fissured areas. He does not yet have a dermatology appointment 2/17; left heel remains closed. He has a new open area on the left buttock. The area on the left lateral calf is bigger longer and still covered in necrotic debris. No major change in his foot areas bilaterally. I am awaiting  for a dermatologist to look on this. We have been using ketoconazole I do not know that this is been doing any good at all. 2/24; left heel remains closed. The left buttock wound that was new reopening last week looks better. The left lateral calf appears better also although still requires debridement. The major area on his foot is the left first second also requiring debridement. We have been putting Prisma on all wounds. I do not believe that the ketoconazole has done too much good for his feet. He will use Lotrisone I am going to give him a 2-week course of terbinafine. We still do not have a dermatology appointment 3/2 left heel remains closed however there is skin over bone in this area I pointed this out to him today. The left buttock wound is epithelialized but still does not look completely stable. The area on the left leg required debridement were using silver collagen here. With regards to his feet we changed to Lotrisone last week and silver alginate. 3/9; left heel remains closed. Left buttock remains closed. The area on the right foot is essentially closed. The left foot remains unchanged. Slightly smaller on the left lateral calf. Using silver collagen to both of these areas 3/16-Left heel remains closed. Area on right foot is closed. Left lateral calf above the lateral malleolus open wound requiring debridement with easy bleeding. Left dorsal wound proximal to first toe also debrided. Left ischial area open new. Patient has been using Prisma with wrapping every 3 days. Dermatology appointment is apparently tomorrow.Patient has completed his terbinafine  2-week course with some apparent improvement according to him, there is still flaking and dry skin in his foot on the left 3/23; area on the right foot is reopened. The area on the left anterior foot is about the same still a very necrotic adherent surface. He still has the area on the left leg and reopening is on the left buttock. He apparently saw dermatology although I do not have a note. According to the patient who is usually fairly well informed they did not have any good ideas. Put him on oral terbinafine which she is been on before. 3/30; using silver collagen to all wounds. Apparently his dermatologist put him on doxycycline and rifampin presumably some culture grew staph. I do not have this result. He remains on terbinafine although I have used terbinafine on him before 4/6; patient has had a fairly substantial reopening on the right foot between the first and second toes. He is finished his terbinafine and I believe is on doxycycline and rifampin still as prescribed by dermatology. We have been using silver collagen to all his wounds although the patient reports that he thinks silver alginate does better on the wounds on his buttock. 4/13; the area on his left lateral calf about the same size but it did not require debridement. Left dorsal foot just proximal to the webspace between the first and second toes is about the same. Still nonviable surface. I note some superficial bronze discoloration of the dorsal part of his foot Right dorsal foot just proximal to the first and second toes also looks about the same. I still think there may be the same discoloration I noted above on the left Left buttock wound looks about the same 4/20; left lateral calf appears to be gradually contracting using silver collagen. 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 The left dorsal foot wound is debrided of tightly adherent necrotic  debris and  really cleans up quite nicely. The right area is worse with expansion. I did not debride this it is now over the base of the second toe The area on his left buttock is smaller no debridement is required using silver collagen 5/4; left calf continues to make good progress. He arrives with erythema around the wounds on his dorsal foot which even extends to the plantar aspect. Very concerning for coexistent infection. He is finished the erythromycin I gave him for possible erythrasma this does not seem to have helped. The area on the left foot is about the same base of the dorsal toes Is area on the buttock looks improved on the left 5/11; left calf and left buttock continued to make good progress. Left foot is about the same to slightly improved. Major problem is on the right foot. He has not had an x-ray. Deep tissue culture I did last week showed both Enterobacter and E. coli. I did not change the doxycycline I put him on empirically although neither 1 of these were plated to doxycycline. He arrives today with the erythema looking worse on both the dorsal and plantar foot. Macerated skin on the bottom of the foot. he has not been systemically unwell 5/18-Patient returns at 1 week, left calf wound appears to be making some progress, left buttock wound appears slightly worse than last time, left foot wound looks slightly better, right foot redness is marginally better. X-ray of both feet show no air or evidence of osteomyelitis. Patient is finished his Omnicef and terbinafine. He continues to have macerated skin on the bottom of the left foot as well as right 5/26; left calf wound is better, left buttock wound appears to have multiple small superficial open areas with surrounding macerated skin. X-rays that I did last time showed no evidence of osteomyelitis in either foot. He is finished cefdinir and doxycycline. I do not think that he was on terbinafine. He continues to have a large  superficial open area on the right foot anterior dorsal and slightly between the first and second toes. I did send him to dermatology 2 months ago or so wondering about whether they would do a fungal scraping. I do not believe they did but did do a culture. We have been using silver alginate to the toe areas, he has been using antifungals at home topically either ketoconazole or Lotrisone. We are using silver collagen on the left foot, silver alginate on the right, silver collagen on the left lateral leg and silver alginate on the left buttock 6/1; left buttock area is healed. We have the left dorsal foot, left lateral leg and right dorsal foot. We are using silver alginate to the areas on both feet and silver collagen to the area on his left lateral calf 6/8; the left buttock apparently reopened late last week. He is not really sure how this happened. He is tolerating the terbinafine. Using silver alginate to all wounds 6/15; left buttock wound is larger than last week but still superficial. Came in the clinic today with a report of purulence from the left lateral leg I did not identify any infection Both areas on his dorsal feet appear to be better. He is tolerating the terbinafine. Using silver alginate to all wounds 6/22; left buttock is about the same this week, left calf quite a bit better. His left foot is about the same however he comes in with erythema and warmth in the right forefoot once again. Culture that  I gave him in the beginning of May showed Enterobacter and E. coli. I gave him doxycycline and things seem to improve although neither 1 of these organisms was specifically plated. 6/29; left buttock is larger and dry this week. Left lateral calf looks to me to be improved. Left dorsal foot also somewhat improved right foot completely unchanged. The erythema on the right foot is still present. He is completing the Ceftin dinner that I gave him empirically [see discussion  above.) 7/6 - All wounds look to be stable and perhaps improved, the left buttock wound is slightly smaller, per patient bleeds easily, completed ceftin, the right foot redness is less, he is on terbinafine 7/13; left buttock wound about the same perhaps slightly narrower. Area on the left lateral leg continues to narrow. Left dorsal foot slightly smaller right foot about the same. We are using silver alginate on the right foot and Hydrofera Blue to the areas on the left. Unna boot on the left 2 layer compression on the right 7/20; left buttock wound absolutely the same. Area on lateral leg continues to get better. Left dorsal foot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 give healed on the left he is still using Santyl. 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  The erythema has not really resolved and I wonder if this is mostly venous inflammation rather than cellulitis. Still using Santyl. He is approved for Apligraf 03/04/16; there is less erythema around the  wound. Both wounds require aggressive surgical debridement. Not yet ready for Apligraf 03/11/16; aggressive debridement again. Not ready for Apligraf 03/18/16 aggressive debridement again. 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 Has been to see vascular surgery he is being planned for a venous ablation 03/25/16; aggressive debridement again of both wound areas on the left lateral leg. He is due for ablation surgery on May 22. He is much closer to being ready for an Apligraf. Has a new area between the left first and second toes 04/01/16 aggressive debridement done of both wounds. The new wound at the base of between his second and first toes looks stable 04/08/16; continued aggressive debridement of both wounds on the left lower leg. He goes for his venous ablation on Monday. The new wound at the base of his first and second toes dorsally appears stable. 04/15/16; wounds aggressively debridement although the base of this looks considerably better Apligraf #1. He had ablation surgery on Monday I'll need to research these records. We only have approval for four Apligraf's 04/22/16; the patient is here for a wound check [Apligraf last week] intake nurse concerned about erythema around the wounds. Apparently a significant degree of drainage. The patient has chronic venous inflammation which I think accounts for most of this however I was asked to look at this today 04/26/16; the patient came back for check of possible cellulitis in his left foot however the Apligraf dressing was inadvertently removed therefore we elected to prep the wound for a second Apligraf. I put him on doxycycline on 6/1 the erythema in the foot 05/03/16 we did not remove the dressing from the superior wound as this is where I put  all of his last Apligraf. Surface debridement done with a curette of the lower wound which looks very healthy. The area on the left foot also looks quite satisfactory at the dorsal artery at the first and second toes 05/10/16; continue Apligraf to this. Her wound, Hydrafera to the lower wound. He has a new area on the right second toe. Left dorsal foot firstoosecond toe also looks improved 05/24/16; wound dimensions must be smaller I was able to use Apligraf to all 3 remaining wound areas. 06/07/16 patient's last Apligraf was 2 weeks ago. He arrives today with the 2 wounds on his lateral left leg joined together. This would have to be seen as a negative. He also has a small wound in his first and second toe on the left dorsally with quite a bit of surrounding erythema in the first second and third toes. This looks to be infected or inflamed, very difficult clinical call. 06/21/16: lateral left leg combined wounds. Adherent surface slough area on the left dorsal foot at roughly the fourth toe looks improved 07/12/16; he now has a single linear wound on the lateral left leg. This does not look to be a lot changed from when I lost saw this. The area on his dorsal left foot looks considerably better however. 08/02/16; no major change in the substantial area on his left lateral leg since last time. We have been using Hydrofera Blue for a prolonged period of time now. The area on his left foot is also unchanged from last review 07/19/16; the area on his dorsal foot on the left looks considerably smaller. He is beginning to have significant rims of epithelialization on the lateral left leg wound. This also looks better. 08/05/16; the patient came in for a nurse visit today. Apparently the area on his left lateral leg  looks better and it was wrapped. However in general discussion the patient noted a new area on the dorsal aspect of his right second toe. The exact etiology of this is unclear but likely relates to  pressure. 08/09/16 really the area on the left lateral leg did not really look that healthy today perhaps slightly larger and measurements. The area on his dorsal right second toe is improved also the left foot wound looks stable to improved 08/16/16; the area on the last lateral leg did not change any of dimensions. Post debridement with a curet the area looked better. Left foot wound improved and the area on the dorsal right second toe is improved 08/23/16; the area on the left lateral leg may be slightly smaller both in terms of length and width. Aggressive debridement with a curette afterwards the tissue appears healthier. Left foot wound appears improved in the area on the dorsal right second toe is improved 08/30/16 patient developed a fever over the weekend and was seen in an urgent care. Felt to have a UTI and put on doxycycline. He has been since changed over the phone to [facility]. After we took off the wrap on his right leg today the leg is swollen warm and erythematous, probably more likely the source of the fever 09/06/16; have been using collagen to the major left leg wound, silver alginate to the area on his anterior foot/toes 09/13/16; the areas on his anterior foot/toes on both sides appear to be virtually closed. Extensive wound on the left lateral leg perhaps slightly narrower but each visit still covered an adherent surface slough 09/16/16 patient was in for his usual Thursday nurse visit however the intake nurse noted significant erythema of his dorsal right foot. He is also running a low-grade fever and having increasing spasms in the right leg 09/20/16 here for cellulitis involving his right great toes and forefoot. This is a lot better. Still requiring debridement on his left lateral leg. Santyl direct says he needs prior authorization. Therefore his wife cannot change this at home 09/30/16; the patient's extensive area on the left lateral calf and ankle perhaps somewhat better.  Using Santyl. The area on the left toes is healed and I think the area on his right dorsal foot is healed as well. There is no cellulitis or venous inflammation involving the right leg. He is going to need compression stockings here. 10/07/16; the patient's extensive wound on the left lateral calf and ankle does not measure any differently however there appears to be less adherent surface slough using Santyl and aggressive weekly debridements 10/21/16; no major change in the area on the left lateral calf. Still the same measurement still very difficult to debridement adherent slough and nonviable subcutaneous tissue. This is not really been helped by several weeks of Santyl. Previously for 2 weeks I used Iodoflex for a short period. A prolonged course of Hydrofera Blue didn't really help. I'm not sure why I only used 2 weeks of Iodoflex on this there is no evidence of surrounding infection. He has a small area on the right second toe which looks as though it's progressing towards closure 10/28/16; the wounds on his toes appear to be closed. No major change in the left lateral leg wound although the surface looks somewhat better using Iodoflex. He has had previous arterial studies that were normal. He has had reflux studies and is status post ablation although I don't have any exact notes on which vein was ablated. I'll need to check  the surgical record 11/04/16; he's had a reopening between the first and second toe on the left and right. No major change in the left lateral leg wound. There is what appears to be cellulitis of the left dorsal foot 11/18/16 the patient was hospitalized initially in [place] and then subsequently transferred to [facility] long and was admitted there from 11/09/16 through 11/12/16. He had developed progressive cellulitis on the right leg in spite of the doxycycline I gave him. I'd spoken to the hospitalist in [place] who was concerned about continuing leukocytosis. CT scan  is what I suggested this was done which showed soft tissue swelling without evidence of osteomyelitis or an underlying abscess blood cultures were negative. At [facility] he was treated with vancomycin and Primaxin and then add an infectious disease consult. He was transitioned to Ceftaroline. He has been making progressive improvement. Overall a severe cellulitis of the right leg. He is been using silver alginate to her original wound on the left leg. The wounds in his toes on the right are closed there is a small open area on the base of the left second toe 11/26/15; the patient's right leg is much better although there is still some edema here this could be reminiscent from his severe cellulitis likely on top of some degree of lymphedema. His left anterior leg wound has less surface slough as reported by her intake nurse. Small wound at the base of the left second toe 12/02/16; patient's right leg is better and there is no open wound here. His left anterior lateral leg wound continues to have a healthy-looking surface. Small wound at the base of the left second toe however there is erythema in the left forefoot which is worrisome 12/16/16; is no open wounds on his right leg. We took measurements for stockings. His left anterior lateral leg wound continues to have a healthy-looking surface. I'm not sure where we were with the Apligraf run through his insurance. We have been using Iodoflex. He has a thick eschar on the left first second toe interface, I suspect this may be fungal however there is no visible open 12/23/16; no open wound on his right leg. He has 2 small areas left of the linear wound that was remaining last week. We have been using Prisma, I thought I have disclosed this week, we can only look forward to next week 01/03/17; the patient had concerning areas of erythema last week, already on doxycycline for UTI through his primary doctor. The erythema is absolutely no better there is  warmth and swelling both medially from the left lateral leg wound and also the dorsal left foot. 01/06/17- Patient is here for follow-up evaluation of his left lateral leg ulcer and bilateral feet ulcers. He is on oral antibiotic therapy, tolerating that. Nursing staff and the patient states that the erythema is improved from Monday. 01/13/17; the predominant left lateral leg wound continues to be problematic. I had put Apligraf on him earlier this month once. However he subsequently developed what appeared to be an intense cellulitis around the left lateral leg wound. I gave him Dalvance I think on 2/12 perhaps 2/13 he continues on cefdinir. The erythema is still present but the warmth and swelling is improved. I am hopeful that the cellulitis part of this control. I wouldn't be surprised if there is an element of venous inflammation as well. 01/17/17. The erythema is present but better in the left leg. His left lateral leg wound still does not have a viable  surface buttons certain parts of this long thin wound it appears like there has been improvement in dimensions. 01/20/17; the erythema still present but much better in the left leg. I'm thinking this is his usual degree of chronic venous inflammation. The wound on the left leg looks somewhat better. Is less surface slough 01/27/17; erythema is back to the chronic venous inflammation. The wound on the left leg is somewhat better. I am back to the point where I like to try an Apligraf once again 02/10/17; slight improvement in wound dimensions. Apligraf #2. He is completing his doxycycline 02/14/17; patient arrives today having completed doxycycline last Thursday. This was supposed to be a nurse visit however once again he hasn't tense erythema from the medial part of his wound extending over the lower leg. Also erythema in his foot this is roughly in the same distribution as last time. He has baseline chronic venous inflammation however this is a lot  worse than the baseline I have learned to accept the on him is baseline inflammation 02/24/17- patient is here for follow-up evaluation. He is tolerating compression therapy. His voicing no complaints or concerns he is here anticipating an Apligraf 03/03/17; he arrives today with an adherent necrotic surface. I don't think this is surface is going to be amenable for Apligraf's. The erythema around his wound and on the left dorsal foot has resolved he is off antibiotics 03/10/17; better-looking surface today. I don't think he can tolerate Apligraf's. He tells me he had a wound VAC after a skin graft years ago to this area and they had difficulty with a seal. The erythema continues to be stable around this some degree of chronic venous inflammation but he also has recurrent cellulitis. We have been using Iodoflex 03/17/17; continued improvement in the surface and may be small changes in dimensions. Using Iodoflex which seems the only thing that will control his surface 03/24/17- He is here for follow up evaluation of his LLE lateral ulceration and ulcer to right dorsal foot/toe space. He is voicing no complaints or concerns, He is tolerating compression wrap. 03/31/17 arrives today with a much healthier looking wound on the left lower extremity. We have been using Iodoflex for a prolonged period of time which has for the first time prepared and adequate looking wound bed although we have not had much in the way of wound dimension improvement. He also has a small wound between the first and second toe on the right 04/07/17; arrives today with a healthy-looking wound bed and at least the top 50% of this wound appears to be now her. No debridement was required I have changed him to [facility] last week after prolonged Iodoflex. He did not do well with Apligraf's. We've had a re-opening between the first and second toe on the right 04/14/17; arrives today with a healthier looking wound bed contractions and  the top 50% of this wound and some on the lesser 50%. Wound bed appears healthy. The area between the first and second toe on the right still remains problematic 04/21/17; continued very gradual improvement. Using [facility] 04/28/17; continued very gradual improvement in the left lateral leg venous insufficiency wound. His periwound erythema is very mild. We have been using Hydrofera Blue. Wound is making progress especially in the superior 50% 05/05/17; he continues to have very gradual improvement in the left lateral venous insufficiency wound. Both in terms with an length rings are improving. I debrided this every 2 weeks with #5 curet and we  have been using Hydrofera Blue and again making good progress With regards to the wounds between his right first and second toe which I thought might of been tinea pedis he is not making as much progress very dry scaly skin over the area. Also the area at the base of the left first and second toe in a similar condition 05/12/17; continued gradual improvement in the refractory left lateral venous insufficiency wound on the left. Dimension smaller. Surface still requiring debridement using Hydrofera Blue 05/19/17; continued gradual improvement in the refractory left lateral venous ulceration. Careful inspection of the wound bed underlying rumination suggested some degree of epithelialization over the surface no debridement indicated. Continue Hydrofera Blue difficult areas between his toes first and third on the left than first and second on the right. I'm going to change to silver alginate from silver collagen. Continue ketoconazole as I suspect underlying tinea pedis 05/26/17; left lateral leg venous insufficiency wound. We've been using Hydrofera Blue. I believe that there is expanding epithelialization over the surface of the wound albeit not coming from the wound circumference. This is a bit of an odd situation in which the epithelialization seems to be  coming from the surface of the wound rather than in the exact circumference. There is still small open areas mostly along the lateral margin of the wound. ooHe has unchanged areas between the left first and second and the right first second toes which I been treating for tenia pedis 06/02/17; left lateral leg venous insufficiency wound. We have been using Hydrofera Blue. Somewhat smaller from the wound circumference. The surface of the wound remains a bit on it almost epithelialized sedation in appearance. I use an open curette today debridement in the surface of all of this especially the edges ooSmall open wounds remaining on the dorsal right first and second toe interspace and the plantar left first second toe and her face on the left 06/09/17; wound on the left lateral leg continues to be smaller but very gradual and very dry surface using Hydrofera Blue 06/16/17 requires weekly debridements now on the left lateral leg although this continues to contract. I changed to silver collagen last week because of dryness of the wound bed. Using Iodoflex to the areas on his first and second toes/web space bilaterally 06/24/17; patient with history of paraplegia also chronic venous insufficiency with lymphedema. Has a very difficult wound on the left lateral leg. This has been gradually reducing in terms of with but comes in with a very dry adherent surface. High switch to silver collagen a week or so ago with hydrogel to keep the area moist. This is been refractory to multiple dressing attempts. He also has areas in his first and second toes bilaterally in the anterior and posterior web space. I had been using Iodoflex here after a prolonged course of silver alginate with ketoconazole was ineffective [question tinea pedis] 07/14/17; patient arrives today with a very difficult adherent material over his left lateral lower leg wound. He also has surrounding erythema and poorly controlled edema. He was  switched his Santyl last visit which the nurses are applying once during his doctor visit and once on a nurse visit. He was also reduced to 2 layer compression I'm not exactly sure of the issue here. 07/21/17; better surface today after 1 week of Iodoflex. Significant cellulitis that we treated last week also better. [Doxycycline] 07/28/17 better surface today with now 2 weeks of Iodoflex. Significant cellulitis treated with doxycycline. He has now completed the  doxycycline and he is back to his usual degree of chronic venous inflammation/stasis dermatitis. He reminds me he has had ablations surgery here 08/04/17; continued improvement with Iodoflex to the left lateral leg wound in terms of the surface of the wound although the dimensions are better. 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 Problematic areas on the plantar aspect of the first second toe web space on the left and the dorsal aspect of the first second toe web space on the right. At one point I felt these were probably related to chronic fungal infections in treated him aggressively for this although we have not made any improvement here. 08/11/17; left lateral leg. Surface continues to improve with the Iodoflex although we are not seeing much improvement in overall wound dimensions. Areas on his plantar left foot and right foot show no improvement. In fact the right foot looks somewhat worse 08/18/17; left lateral leg. We changed to [facility] Blue last week after a prolonged course of Iodoflex which helps get the surface better. It appears that the wound with is improved. Continue with difficult areas on the left dorsal first second and plantar first second on the right 09/01/17; patient arrives in clinic today having had a temperature of 103 yesterday. He was seen in the ER and [facility]. The patient was concerned he could have cellulitis again in the right leg  however they diagnosed him with a UTI and he is now on Keflex. He has a history of cellulitis which is been recurrent and difficult but this is been in the left leg, in the past 5 use doxycycline. He does in and out catheterizations at home which are risk factors for UTI 09/08/17; patient will be completing his Keflex this weekend. The erythema on the left leg is considerably better. He has a new wound today on the medial part of the right leg small superficial almost looks like a skin tear. He has worsening of the area on the right dorsal first and second toe. His major area on the left lateral leg is better. Using Hydrofera Blue on all areas 09/15/17; gradual reduction in width on the long wound in the left lateral leg. No debridement required. He also has wounds on the plantar aspect of his left first second toe web space and on the dorsal aspect of the right first second toe web space. 09/22/17; there continues to be very gradual improvements in the dimensions of the left lateral leg wound. He hasn't round erythematous spot with might be pressure on his wheelchair. There is no evidence obviously of infection no purulence no warmth ooHe has a dry scaled area on the plantar aspect of the left first second toe ooImproved area on the dorsal right first second toe. 09/29/17; left lateral leg wound continues to improve in dimensions mostly with an is still a fairly long but increasingly narrow wound. ooHe has a dry scaled area on the plantar aspect of his left first second toe web space ooIncreasingly concerning area on the dorsal right first second toe. In fact I am concerned today about possible cellulitis around this wound. The areas extending up his second toe and although there is deformities here almost appears to abut on the nailbed. 10/06/17; left lateral leg wound continues to make very gradual progress. Tissue culture I did from the right first second toe dorsal foot last time grew  MRSA and enterococcus which was vancomycin sensitive. This was not sensitive  to clindamycin or doxycycline. He is allergic to Zyvox and sulfa we have therefore arrange for him to have dalvance infusion tomorrow. He is had this in the past and tolerated it well 10/20/17; left lateral leg wound continues to make decent progress. This is certainly reduced in terms of with there is advancing epithelialization.ooThe cellulitis in the right foot looks better although he still has a deep wound in the dorsal aspect of the first second toe web space. Plantar left first toe web space on the left I think is making some progress 10/27/17; left lateral leg wound continues to make decent progress. Advancing epithelialization.using Hydrofera Blue ooThe right first second toe web space wound is better-looking using silver alginate ooImprovement in the left plantar first second toe web space. Again using silver alginate 11/03/17 left lateral leg wound continues to make decent progress albeit slowly. Using Hydrofera Blue ooThe right per second toe web space continues to be a very problematic looking punched out wound. I obtained a piece of tissue for deep culture I did extensively treated this for fungus. It is difficult to imagine that this is a pressure area as the patient states other than going outside he doesn't really wear shoes at home ooThe left plantar first second toe web space looked fairly senescent. Necrotic edges. This required debridement oochange to Hydrofera Blue to all wound areas 11/10/17; left lateral leg wound continues to contract. Using Hydrofera Blue ooOn the right dorsal first second toe web space dorsally. Culture I did of this area last week grew MRSA there is not an easy oral option in this patient was multiple antibiotic allergies or intolerances. This was only a rare culture isolate I'm therefore going to use Bactroban under silver alginate ooOn the left plantar first second  toe web space. Debridement is required here. This is also unchanged 11/17/17; left lateral leg wound continues to contract using Hydrofera Blue this is no longer the major issue. ooThe major concern here is the right first second toe web space. He now has an open area going from dorsally to the plantar aspect. There is now wound on the inner lateral part of the first toe. Not a very viable surface on this. There is erythema spreading medially into the forefoot. ooNo major change in the left first second toe plantar wound 11/24/17; left lateral leg wound continues to contract using Hydrofera Blue. Nice improvement today ooThe right first second toe web space all of this looks a lot less angry than last week. I have given him clindamycin and topical Bactroban for MRSA and terbinafine for the possibility of underlining tinea pedis that I could not control with ketoconazole. Looks somewhat better ooThe area on the plantar left first second toe web space is weeping with dried debris around the wound 12/01/17; left lateral leg wound continues to contract he Hydrofera Blue. It is becoming thinner in terms of with nevertheless it is making good improvement. ooThe right first second toe web space looks less angry but still a large necrotic-looking wounds starting on the plantar aspect of the right foot extending between the toes and now extensively on the base of the right second toe. I gave him clindamycin and topical Bactroban for MRSA anterior benefiting for the possibility of underlying tinea pedis. Not looking better today ooThe area on the left first/second toe looks better. Debrided of necrotic debris 12/05/17* the patient was worked in urgently today because over the weekend he found blood on his incontinence bad when he woke up.  He was found to have an ulcer by his wife who does most of his wound care. He came in today for [place] to look at this. He has not had a history of wounds in his buttocks  in spite of his paraplegia. 12/08/17; seen in follow-up today at his usual appointment. He was seen earlier this week and found to have a new wound on his buttock. We also follow him for wounds on the left lateral leg, left first second toe web space and right first second toe web space 12/15/17; we have been using Hydrofera Blue to the left lateral leg which has improved. The right first second toe web space has also improved. Left first second toe web space plantar aspect looks stable. The left buttock has worsened using Santyl. Apparently the buttock has drainage 12/22/17; we have been using Hydrofera Blue to the left lateral leg which continues to improve now 2 small wounds separated by normal skin. He tells [place] he had a fever up to 100 yesterday he is prone to UTIs but has not noted anything different. He does in and out catheterizations. The area between the first and second toes today does not look good necrotic surface covered with what looks to be purulent drainage and erythema extending into the third toe. I had gotten this to something that I thought look better last time however it is not look good today. He also has a necrotic surface over the buttock wound which is expanded. I thought there might be infection under here so I removed a lot of the surface with a #5 curet though nothing look like it really needed culturing. He is been using Santyl to this area 12/27/17; his original wound on the left lateral leg continues to improve using Hydrofera Blue. I gave him samples of Baxdella although he was unable to take them out of fear for an allergic reaction ["lump in his throat"].the culture I did of the purulent drainage from his second toe last week showed both enterococcus and a set Enterobacter I was also concerned about the erythema on the bottom of his foot although paradoxically although this looks somewhat better today. Finally his pressure ulcer on the left buttock looks worse this  is clearly now a stage III wound necrotic surface requiring debridement. We've been using silver alginate here. They came up today that he sleeps in a recliner, I'm not sure why but I asked him to stop this 01/03/18; his original wound we've been using Hydrofera Blue is now separated into 2 areas. ooUlcer on his left buttock is better he is off the recliner and sleeping in bed ooFinally both wound areas between his first and second toes also looks some better 01/10/18; his original wound on the left lateral leg is now separated into 2 wounds we've been using Hydrofera Blue ooUlcer on his left buttock has some drainage. There is a small probing site going into muscle layer superiorly.using silver alginate -He arrives today with a deep tissue injury on the left heel ooThe wound on the dorsal aspect of his first second toe on the left looks a lot betterusing silver alginate ketoconazole ooThe area on the first second toe web space on the right also looks a lot bette 01/17/18; his original wound on the left lateral leg continues to progress using Hydrofera Blue ooUlcer on his left buttock also is smaller surface healthier except for a small probing site going into the muscle layer superiorly. 2.4 cm of tunneling in this  area ooDTI on his left heel we have only been offloading. Looks better than last week no threatened open no evidence of infection oothe wound on the dorsal aspect of the first second toe on the left continues to look like it's regressing we have only been using silver alginate and terbinafine orally ooThe area in the first second toe web space on the right also looks to be a lot better using silver alginate and terbinafine I think this was prompted by tinea pedis 01/31/18; the patient was hospitalized in [place] last week apparently for a complicated UTI. He was discharged on cefepime he does in and out catheterizations. In the hospital he was discovered M I don't mild  elevation of AST and ALTs and the terbinafine was stopped.predictably the pressure ulcer on his buttock looks betterusing silver alginate. The area on the left lateral leg also is better using Hydrofera Blue. The area between the first and second toes on the left better. First and second toes on the right still substantial but better. Finally the DTI on the left heel has held together and looks like it's resolving 02/07/18-he is here in follow-up evaluation for multiple ulcerations. He has new injury to the lateral aspect of the last issue a pressure ulcer, he states this is from adhesive removal trauma. He states he has tried multiple adhesive products with no success. All other ulcers appear stable. The left heel DTI is resolving. We will continue with same treatment plan and follow-up next week. 02/14/18; follow-up for multiple areas. ooHe has a new area last week on the lateral aspect of his pressure ulcer more over the posterior trochanter. The original pressure ulcer looks quite stable has healthy granulation. 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 Almost fully epithelialized on the original superior area using Hydrofera Blue ooDTI on the left heel has peeled off this week to reveal a small superficial wound under denuded skin and subcutaneous tissue ooBoth areas between the first and second toes look better including nothing open on the left 02/21/18; ooThe patient's wounds on his left ischial tuberosity and posterior left greater trochanter actually looked better. He has a large area of irritation around the area which I think is contact dermatitis. I am doubtful that this is fungal ooHis original wound on the left lateral calf continues to improve we have been using Hydrofera Blue ooThere is no open area in the left first second toe web space although there is a lot of thick  callus ooThe DTI on the left heel required debridement today of necrotic surface eschar and subcutaneous tissue using silver alginate ooFinally the area on the right first second toe webspace continues to contract using silver alginate and ketoconazole 02/28/18 ooLeft ischial tuberosity wounds look better using silver alginate. ooOriginal wound on the left calf only has one small open area left using Hydrofera Blue ooDTI on the left heel required debridement mostly removing skin from around this wound surface. Using silver alginate ooThe areas on the right first/second toe web space using silver alginate and ketoconazole 03/08/18 on evaluation today patient appears to be doing decently well as best I can tell in regard to his wounds. This is the first time that I have seen him as he generally is followed by Dr. Dellia Nims. With that being said none of his wounds appear to be infected he does have an area where there is some skin covering  what appears to be a new wound on the left dorsal surface of his great toe. This is right at the nail bed. With that being said I do believe that debrided away some of the excess skin can be of benefit in this regard. Otherwise he has been tolerating the dressing changes without complication. 03/14/18; patient arrives today with the multiplicity of wounds that we are following. He has not been systemically unwell ooOriginal wound on the left lateral calf now only has 2 small open areas we've been using Hydrofera Blue which should continue ooThe deep tissue injury on the left heel requires debridement today. We've been using silver alginate ooThe left first second toe and the right first second toe are both are reminiscence what I think was tinea pedis. Apparently some of the callus Surface between the toes was removed last week when it started draining. ooPurulent drainage coming from the wound on the ischial tuberosity on the left. 03/21/18-He is here in  follow-up evaluation for multiple wounds. 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 He tolerated debridement. The only change to last week's recommendations is to discontinue antifungal cream between toes. He will follow-up next week 03/28/18; following up for multiple wounds;Concern this week is streaking redness and swelling in the right foot. He is going to need antibiotics for this. 03/31/18; follow-up for right foot cellulitis. Streaking redness and swelling in the right foot on 03/28/18. 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 I put him on clindamycin 300 mg every 6 and brought him in for a quick check. He has an open wound between his first and second toes on the right foot as a potential source. 04/04/18; ooRight foot cellulitis is resolving he is completing clindamycin. This is truly good news ooLeft lateral calf wound which is initial wound only has one small open area inferiorly this is close to healing out. He has compression stockings. We will use Hydrofera Blue right down to the epithelialization of this ooNonviable surface on the left heel which was initially pressure with a DTI. We've been using Hydrofera Blue. I'm going to switch this back to silver alginate ooLeft first second toe/tinea pedis this looks better using silver alginate ooRight first second toe tinea pedis using silver alginate ooLarge pressure ulcers on theLeft ischial tuberosity. Small wound here Looks better. I am uncertain about the surface over the large wound. Using silver alginate 04/11/18; ooCellulitis in the right foot is resolved ooLeft lateral calf wound which was his original wounds still has 2 tiny open areas remaining this is just about closed ooNonviable surface on the left heel is better but still requires debridement ooLeft first second toe/tinea pedis still open using silver alginate ooRight  first second toe wound tinea pedis I asked him to go back to using ketoconazole and silver alginate ooLarge pressure ulcers on the left ischial tuberosity this shear injury here is resolved. Wound is smaller. No evidence of infection using silver alginate 04/18/18; ooPatient arrives with an intense area of cellulitis in the right mid lower calf extending into the right heel area. Bright red and warm. Smaller area on the left anterior leg. He has a significant history of MRSA. He will definitely need antibioticsoodoxycycline ooHe now has 2 open areas on the left ischial tuberosity the original large wound and now a satellite area which I think was above his initial satellite areas. Not a wonderful surface on this satellite area surrounding erythema  which looks like pressure related. ooHis left lateral calf wound again his original wound is just about closed ooLeft heel pressure injury still requiring debridement ooLeft first second toe looks a lot better using silver alginate ooRight first second toe also using silver alginate and ketoconazole cream also looks better 04/20/18; the patient was worked in early today out of concerns with his cellulitis on the right leg. I had started him on doxycycline. This was 2 days ago. His wife was concerned about the swelling in the area. Also concerned about the left buttock. He has not been systemically unwell no fever chills. No nausea vomiting or diarrhea 04/25/18; the patient's left buttock wound is continued to deteriorate he is using Hydrofera Blue. He is still completing clindamycin for the cellulitis on the right leg although all of this looks better. 05/02/18 ooLeft buttock wound still with a lot of drainage and a very tightly adherent fibrinous necrotic surface. He has a deeper area superiorly ooThe left lateral calf wound is still closed ooDTI wound on the left heel necrotic surface especially the circumference using Iodoflex ooAreas  between his left first second toe and right first second toe both look better. Dorsally and the right first second toe he had a necrotic surface although at smaller. In using silver alginate and ketoconazole. I did a culture last week which was a deep tissue culture of the reminiscence of the open wound on the right first second toe dorsally. This grew a few Acinetobacter and a few methicillin-resistant staph aureus. Nevertheless the area actually this week looked better. I didn't feel the need to specifically address this at least in terms of systemic antibiotics. 05/09/18; wounds are measuring larger more drainage per our intake. We are using Santyl covered with alginate on the large superficial buttock wounds, Iodosorb on the left heel, ketoconazole and silver alginate to the dorsal first and second toes bilaterally. 05/16/18; ooThe area on his left buttock better in some aspects although the area superiorly over the ischial tuberosity required an extensive debridement.using Santyl ooLeft heel appears stable. Using Iodoflex ooThe areas between his first and second toes are not bad however there is spreading erythema up the dorsal aspect of his left foot this looks like cellulitis again. He is insensate the erythema is really very brilliant.o Erysipelas He went to see an allergist days ago because he was itching part of this he had lab work done. This showed a white count of 15.1 with 70% neutrophils. Hemoglobin of 11.4 and a platelet count of 659,000. Last white count we had in Epic was a 2-1/2 years ago which was 25.9 but he was ill at the time. He was able to show me some lab work that was done by his primary physician the pattern is about the same. 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 But prompted me to go ahead and do x-rays of both feet and the pelvis rule out osteomyelitis. He also had a comprehensive metabolic panel this was reasonably  normal his albumin was 3.7 liver function tests BUN/creatinine all normal 05/23/18; x-rays of both his feet from last week were negative for underlying pulmonary abnormality. The x-ray of his pelvis however showed mild irregularity in the left ischial which may represent some early osteomyelitis. The wound in the left ischial continues to get deeper clearly now exposed muscle. Each week necrotic surface material over this area. Whereas the rest of the wounds do not look so bad.   ooThe left ischial wound we have been using Santyl and calcium alginate ooTo the left heel surface necrotic debris using Iodoflex ooThe left lateral leg is still healed ooAreas on the left dorsal foot and the right dorsal foot are about the same. There is some inflammation on the left which might represent contact dermatitis, fungal dermatitis I am doubtful cellulitis although this looks better than last week 05/30/18; CT scan done at Hospital did not show any osteomyelitis or abscess. Suggested the possibility of underlying cellulitis although I don't see a lot of evidence of this at the bedside ooThe wound itself on the left buttock/upper thigh actually looks somewhat better. No debridement ooLeft heel also looks better no debridement continue Iodoflex ooBoth dorsal first second toe spaces appear better using Lotrisone. Left still required debridement 06/06/18; ooIntake reported some purulent looking drainage from the left gluteal wound. Using Santyl and calcium alginate ooLeft heel looks better although still a nonviable surface requiring debridement ooThe left dorsal foot first/second webspace actually expanding and somewhat deeper. I may consider doing a shave biopsy of this area ooRight dorsal foot first/second webspace appears stable to improved. Using Lotrisone and silver alginate to both these areas 06/13/18 ooLeft gluteal surface looks better. Now separated in the 2 wounds. No debridement required.  Still drainage. We'll continue silver alginate ooLeft heel continues to look better with Iodoflex continue this for at least another week ooOf his dorsal foot wounds the area on the left still has some depth although it looks better than last week. We've been using Lotrisone and silver alginate 06/20/18 ooLeft gluteal continues to look better healthy tissue ooLeft heel continues to look better healthy granulation wound is smaller. He is using Iodoflex and his long as this continues continue the Iodoflex ooDorsal right foot looks better unfortunately dorsal left foot does not. There is swelling and erythema of his forefoot. He had minor trauma to this several days ago but doesn't think this was enough to have caused any tissue injury. Foot looks like cellulitis, we have had this problem before 06/27/18 on evaluation today patient appears to be doing a little worse in regard to his foot ulcer. 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 Both of which would really be his only options for treating this infection. In the past he has been given and effusion of Orbactiv. This is done very well for him in the past again it's one time dosing IV antibiotic therapy. Subsequently I do believe this is something we're gonna need to see about doing at this point in time. Currently his other wounds seem to be doing somewhat better in my pinion I'm pretty happy in that regard. 07/03/18 on evaluation today patient's wounds actually appear to be doing fairly well. He has been tolerating the dressing changes without complication. All in all he seems to be showing signs of improvement. In regard to the antibiotics he has been dealing with infectious disease since I saw him last week as far as getting this scheduled. In the end [facility] this coming Friday. In the meantime he has been continuing to perform the dressing changes in such as previous. There does not appear to be any evidence of infection worsengin at this time. 07/10/18; ooSince I last saw this man 2 weeks ago  things have actually improved. IV antibiotics of resulted in less forefoot erythema although there is still some present. He is not systemically unwell ooLeft buttock wounds o2 now have no depth there is increased epithelialization Using silver alginate ooLeft heel still requires debridement using Iodoflex ooLeft dorsal foot still with a sizable wound about the size of a border but healthy granulation ooRight dorsal foot still with a slitlike area using silver alginate 07/18/18; the patient's cellulitis in the left foot is improved in fact I think it is on its way to resolving. ooLeft buttock wounds o2 both look better although the larger one has hypertension granulation we've been using silver alginate ooLeft heel has some thick circumferential redundant skin over the wound edge which will need to be removed today we've been using Iodoflex ooLeft dorsal foot is still a sizable wound required debridement using silver alginate ooThe right dorsal foot is just about closed only a small open area remains here 07/25/18; left foot cellulitis is resolved ooLeft buttock wounds o2 both look better. Hyper-granulation on the major area ooLeft heel as some debris over the surface but otherwise looks a healthier wound. Using silver collagen ooRight dorsal foot is just about closed 07/31/18; arrives with our intake nurse worried about purulent drainage from the buttock. We had hyper-granulation here last week ooHis buttock wounds o2 continue to look better ooLeft heel some debris over the surface but measuring smaller. ooRight dorsal foot unfortunately has openings between the toes ooLeft foot superficial wound looks less  aggravated. 08/07/18 ooButtock wounds continue to look better although some of her granulation and the larger medial wound. silver alginate ooLeft heel continues to look a lot better.silver collagen ooLeft foot superficial wound looks less stable. Requires debridement. He has a new wound superficial area on the foot on the lateral dorsal foot. ooRight foot looks better using silver alginate without Lotrisone 08/14/2018; patient was in the ER last week diagnosed with a UTI. He is now on Cefpodoxime and Macrodantin. ooButtock wounds continued to be smaller. Using silver alginate ooLeft heel continues to look better using silver collagen ooLeft foot superficial wound looks as though it is improving ooRight dorsal foot area is just about healed. 08/21/2018; patient is completed his antibiotics for his UTI. ooHe has 2 open areas on the buttocks. There is still not closed although the surface looks satisfactory. Using silver alginate ooLeft heel continues to improve using silver collagen ooThe bilateral dorsal foot areas which are at the base of his first and second toes/possible tinea pedis are actually stable on the left but worse on the right. The area on the left required debridement of necrotic surface. After debridement I obtained a specimen for PCR culture. ooThe right dorsal foot which is been just about healed last week is now reopened 08/28/2018; culture done on the left dorsal foot showed coag negative staph both staph epidermidis and Lugdunensis. I think this is worthwhile initiating systemic treatment. I will use doxycycline given his long list of allergies. The area on the left heel slightly improved but still requiring debridement. ooThe large wound on the buttock is just about closed whereas the smaller one is larger. Using silver alginate in this area 09/04/2018; patient is completing his doxycycline for the left foot although this continues to be a very difficult  wound area with very adherent necrotic debris. We are using silver alginate to all his wounds right foot left foot and the small wounds on his buttock, silver collagen on the left heel. 09/11/2018; once  again this patient has intense erythema and swelling of the left forefoot. Lesser degrees of erythema in the right foot. He has a long list of allergies and intolerances. I will reinstitute doxycycline. 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 Using silver alginate ooLeft heel also looks better using silver collagen ooUnfortunately both the areas on his feet look worse. The area on the left first second webspace is now gone through to the plantar part of his foot. The area on the left foot anteriorly is irritated with erythema and swelling in the forefoot. 09/25/2018 ooHis wound on the left plantar heel looks better. Using silver collagen ooThe area on the left buttock 2 small remnant areas. One is closed one is still open. Using silver alginate ooThe areas between both his first and second toes look worse. This in spite of long-standing antifungal therapy with ketoconazole and silver alginate which should have antifungal activity ooHe has small areas around his original wound on the left calf one is on the bottom of the original scar tissue and one superiorly both of these are small and superficial but again given wound history in this site this is worrisome 10/02/2018 ooLeft plantar heel continues to gradually contract using silver collagen ooLeft buttock wound is unchanged using silver alginate ooThe areas on his dorsal feet between his first and second toes bilaterally look about the same. 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 Small open area here.  We have been using silver alginate 10/09/2018; ooLeft plantar heel continues to look healthy and contract. No debridement is required ooLeft buttock slightly smaller with a tape injury wound just below which was new this week ooDorsal feet somewhat improved I have been using clindamycin ooLeft lateral looks lower extremity the actual open area looks worse although a lot of this is epithelialized. I am going to change to silver collagen today He has a lot more swelling in the right leg although this is not pitting not red and not particularly warm there is a lot of spasm in the right leg usually indicative of people with paralysis of some underlying discomfort. We have reviewed his vascular status from 2017 he had a left greater saphenous vein ablation. I wonder about referring him back to vascular surgery if the area on the left leg continues to deteriorate. 10/16/2018 in today for follow-up and management of multiple lower extremity ulcers. His left Buttock wound is much lower smaller and almost closed completely. The wound to the left ankle has began to reopen with Epithelialization and some adherent slough. He has multiple new areas to the left foot and leg. The left dorsal foot without much improvement. Wound present between left great webspace and 2nd toe. Erythema and edema present right leg. Right LE ultrasound obtained on 10/10/18 was negative for DVT. 10/23/2018; ooLeft buttock is closed over. Still dry macerated skin but there is no open wound. I suspect this is chronic pressure/moisture ooLeft lateral calf is quite a bit worse than when I saw this last. There is clearly drainage here he has macerated skin into the left plantar heel. We will change the primary dressing to alginate ooLeft dorsal foot has some improvement in overall wound area. Still using clindamycin and silver alginate ooRight dorsal foot about the same as the left  using clindamycin and silver alginate ooThe  erythema in the right leg has resolved. He is DVT rule out was negative ooLeft heel pressure area required debridement although the wound is smaller and the surface is health 10/26/2018 ooThe patient came back in for his nurse check today predominantly because of the drainage coming out of the left lateral leg with a recent reopening of his original wound on the left lateral calf. He comes in today with a large amount of surrounding erythema around the wound extending from the calf into the ankle and even in the area on the dorsal foot. He is not systemically unwell. He is not febrile. Nevertheless this looks like cellulitis. We have been using silver alginate to the area. I changed him to a regular visit and I am going to prescribe him doxycycline. The rationale here is a long list of medication intolerances and a history of MRSA. I did not see anything that I thought would provide a valuable culture 10/30/2018 ooFollow-up from his appointment 4 days ago with really an extensive area of cellulitis in the left calf left lateral ankle and left dorsal foot. I put him on doxycycline. He has a long list of medication allergies which are true allergy reactions. 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 In any case he is a lot better today. The erythema especially in the anterior and lateral left calf is better. He still has left ankle erythema. He also is complaining about increasing edema in the right leg we have only been using Kerlix Coban and he has been doing the wraps at home. Finally he has a spotty rash on the medial part of his upper left calf which looks like folliculitis or perhaps wrap occlusion type injury. Small superficial macules not pustules 11/06/18 patient arrives today with again a considerable degree of erythema around the wound on the left lateral calf extending into the dorsal ankle and dorsal foot. This is a  lot worse than when I saw this last week. He is on doxycycline really with not a lot of improvement. He has not been systemically unwell Wounds on the; left heel actually looks improved. Original area on the left foot and proximity to the first and second toes looks about the same. He has superficial areas on the dorsal foot, anterior calf and then the reopening of his original wound on the left lateral calf which looks about the same ooThe only area he has on the right is the dorsal webspace first and second which is smaller. ooHe has a large area of dry erythematous skin on the left buttock small open area here. 11/13/2018; the patient arrives in much better condition. The erythema around the wound on the left lateral calf is a lot better. Not sure whether this was the clindamycin or the TCA and ketoconazole or just in the improvement in edema control [stasis dermatitis]. In any case this is a lot better. The area on the left heel is very small and just about resolved using silver collagen we have been using silver alginate to the areas on his dorsal feet 11/20/2018; his wounds include the left lateral calf, left heel, dorsal aspects of both feet just proximal to the first second webspace. He is stable to slightly improved. I did not think any changes to his dressings were going to be necessary 11/27/2018 he has a reopening on the left buttock which is surrounded by what looks like  tinea or perhaps some other form of dermatitis. The area on the left dorsal foot has some erythema around it I have marked this area but I am not sure whether this is cellulitis or not. Left heel is not closed. Left calf the reopening is really slightly longer and probably worse 1/13; in general things look better and smaller except for the left dorsal foot. Area on the left heel is just about closed, left buttock looks better only a small wound remains in the skin looks better [using Lotrisone] 1/20; the area on the  left heel only has a few remaining open areas here. Left lateral calf about the same in terms of size, left dorsal foot slightly larger right lateral foot still not closed. The area on the left buttock has no open wound and the surrounding skin looks a lot better 1/27; the area on the left heel is closed. Left lateral calf better but still requiring extensive debridements. The area on his left buttock is closed. He still has the open areas on the left dorsal foot which is slightly smaller in the right foot which is slightly expanded. We have been using Iodoflex on these areas as well 2/3; left heel is closed. Left lateral calf still requiring debridement using Iodoflex there is no open area on his left buttock however he has dry scaly skin over a large area of this. Not really responding well to the Lotrisone. Finally the areas on his dorsal feet at the level of the first second webspace are slightly smaller on the right and about the same on the left. Both of these vigorously debrided with Anasept and gauze 2/10; left heel remains closed he has dry erythematous skin over the left buttock but there is no open wound here. Left lateral leg has come in and with. Still requiring debridement we have been using Iodoflex here. Finally the area on the left dorsal foot and right dorsal foot are really about the same extremely dry callused fissured areas. He does not yet have a dermatology appointment 2/17; left heel remains closed. He has a new open area on the left buttock. The area on the left lateral calf is bigger longer and still covered in necrotic debris. No major change in his foot areas bilaterally. I am awaiting for a dermatologist to look on this. We have been using ketoconazole I do not know that this is been doing any good at all. 2/24; left heel remains closed. The left buttock wound that was new reopening last week looks better. The left lateral calf appears better also although still  requires debridement. The major area on his foot is the left first second also requiring debridement. We have been putting Prisma on all wounds. I do not believe that the ketoconazole has done too much good for his feet. He will use Lotrisone I am going to give him a 2-week course of terbinafine. We still do not have a dermatology appointment 3/2 left heel remains closed however there is skin over bone in this area I pointed this out to him today. The left buttock wound is epithelialized but still does not look completely stable. The area on the left leg required debridement were using silver collagen here. With regards to his feet we changed to Lotrisone last week and silver alginate. 3/9; left heel remains closed. Left buttock remains closed. The area on the right foot is essentially closed. The left foot remains unchanged. Slightly smaller on the left lateral calf. Using silver  collagen to both of these areas 3/16-Left heel remains closed. Area on right foot is closed. Left lateral calf above the lateral malleolus open wound requiring debridement with easy bleeding. Left dorsal wound proximal to first toe also debrided. Left ischial area open new. Patient has been using Prisma with wrapping every 3 days. Dermatology appointment is apparently tomorrow.Patient has completed his terbinafine 2-week course with some apparent improvement according to him, there is still flaking and dry skin in his foot on the left 3/23; area on the right foot is reopened. The area on the left anterior foot is about the same still a very necrotic adherent surface. He still has the area on the left leg and reopening is on the left buttock. He apparently saw dermatology although I do not have a note. According to the patient who is usually fairly well informed they did not have any good ideas. Put him on oral terbinafine which she is been on before. 3/30; using silver collagen to all wounds. Apparently his dermatologist  put him on doxycycline and rifampin presumably some culture grew staph. I do not have this result. He remains on terbinafine although I have used terbinafine on him before 4/6; patient has had a fairly substantial reopening on the right foot between the first and second toes. He is finished his terbinafine and I believe is on doxycycline and rifampin still as prescribed by dermatology. We have been using silver collagen to all his wounds although the patient reports that he thinks silver alginate does better on the wounds on his buttock. 4/13; the area on his left lateral calf about the same size but it did not require debridement. ooLeft dorsal foot just proximal to the webspace between the first and second toes is about the same. Still nonviable surface. I note some superficial bronze discoloration of the dorsal part of his foot ooRight dorsal foot just proximal to the first and second toes also looks about the same. I still think there may be the same discoloration I noted above on the left ooLeft buttock wound looks about the same 4/20; left lateral calf appears to be gradually contracting using silver collagen. 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 The left dorsal foot wound is debrided of tightly adherent necrotic debris and really cleans up quite nicely. The right area is worse with expansion. I did not debride this it is now over the base of the second toe ooThe area on his left buttock is smaller no debridement is required using silver collagen 5/4; left calf continues to make good progress. ooHe arrives with erythema around the wounds on his dorsal foot which even extends to the plantar aspect. Very concerning for coexistent infection. He is finished the erythromycin I gave him for possible erythrasma this does not seem to have helped. ooThe area on the left foot is about the same base of the dorsal toes ooIs area  on the buttock looks improved on the left 5/11; left calf and left buttock continued to make good progress. Left foot is about the same to slightly improved. ooMajor problem is on the right foot. He has not had an x-ray. Deep tissue culture I did last week showed both Enterobacter and E. coli. I did not change the doxycycline I put him on empirically although neither 1 of these were plated to doxycycline. He arrives today with the erythema looking worse on both the dorsal and plantar foot. Macerated skin on the bottom of  the foot. he has not been systemically unwell 5/18-Patient returns at 1 week, left calf wound appears to be making some progress, left buttock wound appears slightly worse than last time, left foot wound looks slightly better, right foot redness is marginally better. X-ray of both feet show no air or evidence of osteomyelitis. Patient is finished his Omnicef and terbinafine. He continues to have macerated skin on the bottom of the left foot as well as right 5/26; left calf wound is better, left buttock wound appears to have multiple small superficial open areas with surrounding macerated skin. X-rays that I did last time showed no evidence of osteomyelitis in either foot. He is finished cefdinir and doxycycline. I do not think that he was on terbinafine. He continues to have a large superficial open area on the right foot anterior dorsal and slightly between the first and second toes. I did send him to dermatology 2 months ago or so wondering about whether they would do a fungal scraping. I do not believe they did but did do a culture. We have been using silver alginate to the toe areas, he has been using antifungals at home topically either ketoconazole or Lotrisone. We are using silver collagen on the left foot, silver alginate on the right, silver collagen on the left lateral leg and silver alginate on the left buttock 6/1; left buttock area is healed. We have the left dorsal  foot, left lateral leg and right dorsal foot. We are using silver alginate to the areas on both feet and silver collagen to the area on his left lateral calf 6/8; the left buttock apparently reopened late last week. He is not really sure how this happened. He is tolerating the terbinafine. Using silver alginate to all wounds 6/15; left buttock wound is larger than last week but still superficial. ooCame in the clinic today with a report of purulence from the left lateral leg I did not identify any infection ooBoth areas on his dorsal feet appear to be better. He is tolerating the terbinafine. Using silver alginate to all wounds 6/22; left buttock is about the same this week, left calf quite a bit better. His left foot is about the same however he comes in with erythema and warmth in the right forefoot once again. Culture that I gave him in the beginning of May showed Enterobacter and E. coli. I gave him doxycycline and things seem to improve although neither 1 of these organisms was specifically plated. 6/29; left buttock is larger and dry this week. Left lateral calf looks to me to be improved. Left dorsal foot also somewhat improved right foot completely unchanged. The erythema on the right foot is still present. He is completing the Ceftin dinner that I gave him empirically [see discussion above.) 7/6 - All wounds look to be stable and perhaps improved, the left buttock wound is slightly smaller, per patient bleeds easily, completed ceftin, the right foot redness is less, he is on terbinafine 7/13; left buttock wound about the same perhaps slightly narrower. Area on the left lateral leg continues to narrow. Left dorsal foot slightly smaller right foot about the same. We are using silver alginate on the right foot and Hydrofera Blue to the areas on the left. Unna boot on the left 2 layer compression on the right 7/20; left buttock wound absolutely the same. Area on lateral leg continues to get  better. Left dorsal foot require debridement as did the right no major change in the 7/27; left  buttock wound the same size necrotic debris over the surface. The area on the lateral leg is closed once again. His left foot looks better right foot about the same although there is some involvement now of the posterior first second toe area. He is still on terbinafine which I have given him for a month, not certain a centimeter major change 06/25/19-All wounds appear to be slightly improved according to report, left buttock wound looks clean, both foot wounds have minimal to no debris the right dorsal foot has minimal slough. We are using Hydrofera Blue to the left and silver alginate to the right foot and ischial wound. 8/10-Wounds all appear to be around the same, the right forefoot distal part has some redness which was not there before, however the wound looks clean and small. Ischial wound looks about the same with no changes 8/17; his wound on the left lateral calf which was his original chronic venous insufficiency wound remains closed. Since I last saw him the areas on the left dorsal foot right dorsal foot generally appear better but require debridement. The area on his left initial tuberosity appears somewhat larger to me perhaps hyper granulated and bleeds very easily. We have been using Hydrofera Blue to the left dorsal foot and silver alginate to everything else 8/24; left lateral calf remains closed. The areas on his dorsal feet on the webspace of the first and second toes bilaterally both look better. The area on the left buttock which is the pressure ulcer stage II slightly smaller. I change the dressing to Hydrofera Blue to all areas 8/31; left lateral calf remains closed. The area on his dorsal feet bilaterally look better. Using Hydrofera Blue. Still requiring debridement on the left foot. No change in the left buttock pressure ulcers however 9/14; left lateral calf remains closed.  Dorsal feet look quite a bit better than 2 weeks ago. Flaking dry skin also a lot better with the ammonium lactate I gave him 2 weeks ago. The area on the left buttock is improved. He states that his Roho cushion developed a leak and he is getting a new one, in the interim he is offloading this vigorously 9/21; left calf remains closed. Left heel which was a possible DTI looks better this week. He had macerated tissue around the left dorsal foot right foot looks satisfactory and improved left buttock wound. I changed his dressings to his feet to silver alginate bilaterally. Continuing Hydrofera Blue on the left buttock. 9/28 left calf remains closed. Left heel did not develop anything [possible DTI] dry flaking skin on the left dorsal foot. Right foot looks satisfactory. Improved left buttock wound. We are using silver alginate on his feet Hydrofera Blue on the buttock. I have asked him to go back to the Lotrisone on his feet including the wounds and surrounding areas 10/5; left calf remains closed. The areas on the left and right feet about the same. A lot of this is epithelialized however debris over the remaining open areas. He is using Lotrisone and silver alginate. The area on the left buttock using Hydrofera Blue 10/26. Patient has been out for 3 weeks secondary to Covid concerns. He tested negative but I think his wife tested positive. He comes in today with the left foot substantially worse, right foot about the same. Even more concerning he states that the area on his left buttock closed over but then reopened and is considerably deeper in one aspect than it was before [stage III wound] 11/2;  left foot really about the same as last week. Quarter sized wound on the dorsal foot just proximal to the first second toes. Surrounding erythema with areas of denuded epithelium. This is not really much different looking. Did not look like cellulitis this time however. ooRight foot area about the  same.. We have been using silver alginate alginate on his toes ooLeft buttock still substantial irritated skin around the wound which I think looks somewhat better. We have been using Hydrofera Blue here. 11/9; left foot larger than last week and a very necrotic surface. Right foot I think is about the same perhaps slightly smaller. Debris around the circumference also addressed. Unfortunately on the left buttock there is been a decline. Satellite lesions below the major wound distally and now a an additional one posteriorly we have been using Hydrofera Blue but I think this is a pressure issue 11/16; left foot ulcer dorsally again a very adherent necrotic surface. Right foot is about the same. Not much change in the pressure ulcer on his left buttock. 11/30; left foot ulcer dorsally basically the same as when I saw him 2 weeks ago. Very adherent fibrinous debris on the wound surface. Patient reports a lot of drainage as well. 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 We have been using Iodoflex, patient changed back to alginate because of the drainage. Area on his right dorsal foot really looks benign with a healthier surface certainly a lot better than on the left. Left buttock wounds all improved using Hydrofera Blue 12/7; left dorsal foot again no improvement. Tightly adherent debris. PCR culture I did last week only showed likely skin contaminant. I have gone ahead and done a punch biopsy of this which is about the last thing in terms of investigations I can think to do. He has known venous insufficiency and venous hypertension and this could be the issue here. The area on the right foot is about the same left buttock slightly worse according to our intake nurse secondary to [employer] Blue sticking to the wound 12/14; biopsy of the left foot that I did last time showed changes that could be related to wound healing/chronic stasis dermatitis  phenomenon no neoplasm. We have been using silver alginate to both feet. I change the one on the left today to Sorbact and silver alginate to his other 2 wounds 12/28; the patient arrives with the following problems; ooMajor issue is the dorsal left foot which continues to be a larger deeper wound area. Still with a completely nonviable surface ooParadoxically the area mirror image on the right on the right dorsal foot appears to be getting better. ooHe had some loss of dry denuded skin from the lower part of his original wound on the left lateral calf. Some of this area looked a little vulnerable and for this reason we put him in wrap that on this side this week ooThe area on his left buttock is larger. He still has the erythematous circular area which I think is a combination of pressure, sweat. This does not look like cellulitis or fungal dermatitis 11/26/2019; -Dorsal left foot large open wound with depth. Still debris over the surface. Using Sorbact ooThe area on the dorsal right foot paradoxically has closed over [facility] has a reopening on the left ankle laterally at the base of his original wound that extended up into the calf. This appears clean. ooThe left buttock wound is smaller but with very adherent necrotic debris over  the surface. We have been using silver alginate here as well The patient had arterial studies done in 2017. He had biphasic waveforms at the dorsalis pedis and posterior tibial bilaterally. ABI in the left was 1.17. Digit waveforms were dampened. He has slight spasticity in the great toes I do not think a TBI would be possible 1/11; the patient comes in today with a sizable reopening between the first and second toes on the right. This is not exactly in the same location where we have been treating wounds previously. According to our intake nurse this was actually fairly deep but 0.6 cm. The area on the left dorsal foot looks about the same the surface is  somewhat cleaner using Sorbact, his MRI is in 2 days. We have not managed yet to get arterial studies. The new reopening on the left lateral calf looks somewhat better using alginate. The left buttock wound is about the same using alginate 1/18; the patient had his ARTERIAL studies which were quite normal. ABI in the right at 1.13 with triphasic/biphasic waveforms on the left ABI 1.06 again with triphasic/biphasic waveforms. It would not have been possible to have done a toe brachial index because of spasticity. We have been using Sorbac to the left foot alginate to the rest of his wounds on the right foot left lateral calf and left buttock 1/25; arrives in clinic with erythema and swelling of the left forefoot worse over the first MTP area. This extends laterally dorsally and but also posteriorly. Still has an area on the left lateral part of the lower part of his calf wound it is eschared and clearly not closed. ooArea on the left buttock still with surrounding irritation and erythema. ooRight foot surface wound dorsally. The area between the right and first and second toes appears better. 2/1; ooThe left foot wound is about the same. Erythema slightly better I gave him a week of doxycycline empirically ooRight foot wound is more extensive extending between the toes to the plantar surface ooLeft lateral calf really no open surface on the inferior part of his original wound however the entire area still looks vulnerable ooAbsolutely no improvement in the left buttock wound required debridement. 2/8; the left foot is about the same. Erythema is slightly improved I gave him clindamycin last week. ooRight foot looks better he is using Lotrimin and silver alginate ooHe has a breakdown in the left lateral calf. Denuded epithelium which I have removed ooLeft buttock about the same were using Hydrofera Blue 2/15; left foot is about the same there is less surrounding erythema. Surface still  has tightly adherent debris which I have debriding however not making any progress ooRight foot has a substantial wound on the medial right second toe between the first and second webspace. ooStill an open area on the left lateral calf distal area. ooButtock wound is about the same 2/22; left foot is about the same less surrounding erythema. Surface has adherent debris. Polymen Ag Right foot area significant wound between the first and second toes. We have been using silver alginate here Left lateral leg polymen Ag at the base of his original venous insufficiency wound ooLeft buttock some improvement here 3/1; ooRight foot is deteriorating in the first second toe webspace. Larger and more substantial. We have been using silver alginate. ooLeft dorsal foot about the same markedly adherent surface debris using PolyMem Ag ooLeft lateral calf surface debris using PolyMem AG ooLeft buttock is improved again using PolyMem Ag. ooHe is completing his  terbinafine. The erythema in the foot seems better. He has been on this for 2 weeks Objective Constitutional Sitting or standing Blood Pressure is within target range for patient.. Pulse regular and within target range for patient.Marland Kitchen Respirations regular, non-labored and within target range.. Temperature is normal and within the target range for the patient.Marland Kitchen Appears in no distress. Vitals Time Taken: 7:53 AM, Height: 70 in, Source: Stated, Weight: 216 lbs, Source: Stated, BMI: 31, Temperature: 98.3 F, Pulse: 128 bpm, Respiratory Rate: 18 breaths/min, Blood Pressure: 113/63 mmHg. General Notes: Wound exam; debridement on the left first and second toes and the left lateral lower leg #5 curette tightly adherent debris ooVirtually impossible to debride the area on the right foot secondary to involuntary spasms ooI am going to attempt to x-ray both feet again looking at the underlying bone. We were not able to get an MRI of the left foot ooNo  current evidence of infection Integumentary (Hair, Skin) Wound #24 status is Open. Original cause of wound was Gradually Appeared. The wound is located on the Left,Dorsal Foot. The wound measures 3.2cm length x 2.8cm width x 0.4cm depth; 7.037cm^2 area and 2.815cm^3 volume. There is Fat Layer (Subcutaneous Tissue) Exposed exposed. There is no tunneling or undermining noted. There is a large amount of serosanguineous drainage noted. The wound margin is distinct with the outline attached to the wound base. There is small (1-33%) red granulation within the wound bed. There is a large (67-100%) amount of necrotic tissue within the wound bed including Adherent Slough. Wound #35 status is Open. Original cause of wound was Gradually Appeared. The wound is located on the Left Ischium. The wound measures 2.4cm length x 1.2cm width x 0.1cm depth; 2.262cm^2 area and 0.226cm^3 volume. There is Fat Layer (Subcutaneous Tissue) Exposed exposed. There is no tunneling or undermining noted. There is a medium amount of serosanguineous drainage noted. The wound margin is flat and intact. There is large (67-100%) red, friable granulation within the wound bed. There is no necrotic tissue within the wound bed. Wound #37 status is Open. Original cause of wound was Gradually Appeared. The wound is located on the Left,Lateral Malleolus. The wound measures 3cm length x 1.4cm width x 0.1cm depth; 3.299cm^2 area and 0.33cm^3 volume. There is Fat Layer (Subcutaneous Tissue) Exposed exposed. There is no tunneling or undermining noted. There is a medium amount of serosanguineous drainage noted. The wound margin is flat and intact. There is small (1-33%) granulation within the wound bed. There is a large (67-100%) amount of necrotic tissue within the wound bed including Adherent Slough. Wound #38 status is Open. Original cause of wound was Gradually Appeared. The wound is located on the Right Toe - Web between 1st and 2nd. The  wound measures 3.7cm length x 2.9cm width x 0.6cm depth; 8.427cm^2 area and 5.056cm^3 volume. There is Fat Layer (Subcutaneous Tissue) Exposed exposed. There is no tunneling or undermining noted. There is a medium amount of serosanguineous drainage noted. The wound margin is flat and intact. There is small (1-33%) red granulation within the wound bed. There is a large (67-100%) amount of necrotic tissue within the wound bed including Adherent Slough. 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 There was a Excisional Skin/Subcutaneous Tissue Debridement with a total area of 8.96 sq cm performed by Ricard Dillon., MD. With the following instrument(s): Curette to remove Viable and Non-Viable tissue/material. Material removed includes Subcutaneous Tissue and Slough and. No specimens were taken. A time out was conducted at 08:28, prior to the start of the procedure. A Minimum amount of bleeding was controlled with Pressure. The procedure was tolerated well with a pain level of 0 throughout and a pain level of 0 following the procedure. Post Debridement Measurements: 3.2cm length x 2.8cm width x 0.4cm depth; 2.815cm^3 volume. Character of Wound/Ulcer Post Debridement is improved. 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 There was a Excisional Skin/Subcutaneous Tissue Debridement with a  total area of 4.2 sq cm performed by Ricard Dillon., MD. With the following instrument(s): Curette to remove Viable and Non-Viable tissue/material. Material removed includes Subcutaneous Tissue and Slough and. No specimens were taken. A time out was conducted at 08:28, prior to the start of the procedure. A Minimum amount of bleeding was controlled with Pressure. The procedure was tolerated well with a pain level of 0 throughout and a pain level of 0 following the procedure. Post Debridement Measurements: 3cm length x 1.4cm width x 0.1cm depth; 0.33cm^3 volume. Character of Wound/Ulcer Post Debridement is improved. Severity of Tissue Post Debridement is: Fat layer exposed. 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 There was a Three Layer Compression Therapy Procedure by Levan Hurst, RN. Post procedure Diagnosis Wound #37: Same as Pre-Procedure Plan Follow-up Appointments: Return Appointment in 1 week. Dressing Change Frequency: Wound #24 Left,Dorsal Foot: Do not change entire dressing for one week. Wound #35 Left Ischium: Change Dressing every other day. Wound #37 Left,Lateral Malleolus: Do not change entire dressing for one week. Wound #38 Right Toe - Web between 1st and 2nd: Change Dressing every other day. Skin Barriers/Peri-Wound Care: Antifungal cream - on toes on both feet daily Moisturizing lotion - both legs Other: - Triamcinolone cream Wound #24 Left,Dorsal Foot: Barrier cream Moisturizing lotion - to both legs and feet Primary Wound Dressing: Wound #24 Left,Dorsal Foot: Polymem Silver Wound #35 Left Ischium: Polymem Silver Wound #37 Left,Lateral Malleolus: Polymem Silver Wound #38 Right Toe - Web between 1st and 2nd: Calcium Alginate with Silver Secondary Dressing: Wound #24 Left,Dorsal Foot: Dry Gauze Heel Cup - add heel cup to left heel for  protection. Wound #35 Left Ischium: Foam Border - or ABD pad and tape Wound #37 Left,Lateral Malleolus: Dry Gauze Wound #38 Right Toe - Web between 1st and 2nd: Kerlix/Rolled Gauze - secure with tape Dry Gauze Edema Control: 3 Layer Compression System - Left Lower Extremity Elevate legs to the level of the heart or above for 30 minutes daily and/or when sitting, a frequency of: - throughout the day Support Garment 30-40 mm/Hg pressure to: - Juxtalite to right leg Off-Loading: Low air-loss mattress (Group 2) Roho cushion for wheelchair Turn and reposition every 2 hours - out of wheelchair throughout the day, try to lay on sides, sleep in the bed not the recliner Radiology ordered were: X-ray, right foot, complete view - non healing ulcer on dorsal foot and  1st/2nd toe web, X-ray, left foot, complete view - non healing wound on dorsal foot 1. I did not change the current dressings which is silver alginate on the right foot and polymen Ag on the rest 2. X-rays of the bilateral feeto Osteomyelitis 3; we have not been able to get an MRI of the left foot secondary to insurance issues 4. His buttock wound actually looks better. Electronic Signature(s) Signed: 01/21/2020 5:45:55 PM By: Linton Ham MD Entered By: Linton Ham on 01/21/2020 08:38:35 -------------------------------------------------------------------------------- SuperBill Details Patient Name: Date of Service: Fobes, [name] 01/21/2020 Medical Record [id number] Patient Account Number: [id number] Date of Birth/Sex: Treating RN: Dec 03, 1985 (34 y.o. [name] Primary Care Provider: Abie, [place] Other Clinician: Referring Provider: Treating Provider/Extender:Blythe Veach, [name], [name]: 211 Diagnosis Coding ICD-10 Codes Code Description L97.521 Non-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 L97.511 Non-pressure chronic ulcer of other part of  right foot limited to breakdown of skin 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, complete 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, stage 3 L97.321 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 to breakdown of skin L03.116 Cellulitis of left lower limb Facility Procedures CPT4 Code Description: [id number] - DEB SUBQ TISSUE 20 SQ CM/< ICD-10 Diagnosis Description L97.521 Non-pressure chronic ulcer of other part of left foot limited L97.321 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 to breakdown Modifier: to breakdown o of skin Quantity: 1 f skin Physician Procedures CPT4 Code Description: [id number] - WC PHYS SUBQ TISS 20 SQ CM ICD-10 Diagnosis Description L97.521 Non-pressure chronic ulcer of other part of left foot limited L97.321 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 to breakdown Modifier: to breakdown of of skin Quantity: 1 skin Electronic Signature(s) Signed: 01/21/2020 5:45:55 PM By: Linton Ham MD Entered By: Linton Ham on 01/21/2020 08:39:15

## 2020-01-22 NOTE — Progress Notes (Addendum)
Betts, [name] ([id number]) Visit Report for 01/21/2020 Arrival Information Details Patient Name: Date of Service: [name], [name] 01/21/2020 7:30 AM Medical Record [id number] Patient Account Number: [id number] Date of Birth/Sex: Treating RN: 12-Aug-1986 (34 y.o. [name] Primary Care Safwan Tomei: [name], [name] Other Clinician: Referring Yolunda Kloos: Treating Angelea Penny/Extender:Robson, [name], [name]: 10 Visit Information History Since Last Visit Added or deleted any medications: No Patient Arrived: Wheel Chair Any new allergies or adverse reactions: No Arrival Time: 07:52 Had a fall or experienced change in No activities of daily living that may affect Accompanied By: self risk of falls: Transfer Assistance: None Signs or symptoms of abuse/neglect since last No Patient Identification Verified: Yes visito Secondary Verification Process Completed: Yes Hospitalized since last visit: No Patient Requires Transmission-Based No Implantable device outside of the clinic excluding No Precautions: cellular tissue based products placed in the center Patient Has Alerts: Yes since last visit: Patient Alerts: R ABI = Has Dressing in Place as Prescribed: Yes 1.0 Has Compression in Place as Prescribed: Yes L ABI = Pain Present Now: No 1.1 Electronic Signature(s) Signed: 01/22/2020 4:52:50 PM By: Baruch Gouty RN, BSN Entered By: Baruch Gouty on 01/21/2020 07:53:45 -------------------------------------------------------------------------------- Compression Therapy Details Patient Name: Date of Service: Santor, [name] 01/21/2020 7:30 AM Medical Record [id number] Patient Account Number: [id number] Date of Birth/Sex: Treating RN: 1986-05-14 (34 y.o. [name] Primary Care Travarus Trudo: Turrell, Douglas Other Clinician: Referring Melita Villalona: Treating Jamica Woodyard/Extender:Robson, [name], [name]: 211 Compression Therapy  Performed for Wound Wound #37 Left,Lateral Malleolus Assessment: Performed By: Clinician Levan Hurst, RN Compression Type: Three Layer Post Procedure Diagnosis Same as Pre-procedure Electronic Signature(s) Signed: 01/21/2020 5:57:14 PM By: Levan Hurst RN, BSN Entered By: Levan Hurst on 01/21/2020 08:33:16 -------------------------------------------------------------------------------- Encounter Discharge Information Details Patient Name: Date of Service: [name], [name] 01/21/2020 7:30 AM Medical Record [id number] Patient Account Number: [id number] Date of Birth/Sex: Treating RN: 1986-08-22 (34 y.o. [name] Primary Care Jaquesha Boroff: [place], [place] Other Clinician: Referring Philipe Laswell: Treating Edelin Fryer/Extender:Robson, [name], [name]: 211 Encounter Discharge Information Items Post Procedure Vitals Discharge Condition: Stable Temperature (F): 98.3 Ambulatory Status: Wheelchair Pulse (bpm): 128 Discharge Destination: Home Respiratory Rate (breaths/min): 18 Transportation: Other Blood Pressure (mmHg): 113/63 Accompanied By: self Schedule Follow-up Appointment: Yes Clinical Summary of Care: Patient Declined Electronic Signature(s) Signed: 01/21/2020 5:21:05 PM By: Kela Millin Entered By: Kela Millin on 01/21/2020 08:55:48 -------------------------------------------------------------------------------- Lower Extremity Assessment Details Patient Name: Date of Service: [name], [name] 01/21/2020 7:30 AM Medical Record [id number] Patient Account Number: [id number] Date of Birth/Sex: Treating RN: 12/09/1985 (34 y.o. [name] Primary Care Connor Meacham: [place], [place] Other Clinician: Referring Nechemia Chiappetta: Treating Deontrae Drinkard/Extender:Robson, [name], [name]: 211 Edema Assessment Assessed: [Left: No] [Right: No] Edema: [Left: Yes] [Right: Yes] Calf Left: Right: Point of Measurement: 33 cm From  Medial Instep 27.5 cm 32.5 cm Ankle Left: Right: Point of Measurement: 10 cm From Medial Instep 24.7 cm 24.1 cm Vascular Assessment Pulses: Dorsalis Pedis Palpable: [Left:Yes] [Right:Yes] Electronic Signature(s) Signed: 01/22/2020 4:52:50 PM By: Baruch Gouty RN, BSN Entered By: Baruch Gouty on 01/21/2020 08:18:33 -------------------------------------------------------------------------------- Multi Wound Chart Details Patient Name: Date of Service: [name], [name] 01/21/2020 7:30 AM Medical Record [id number] Patient Account Number: [id number] Date of Birth/Sex: Treating RN: 1986/02/27 (34 y.o. [name] Primary Care Aide Wojnar: [place], [place] Other Clinician: Referring Calahan Pak: Treating Persephone Schriever/Extender:Robson, [name], [name]: 211 Vital Signs Height(in): 70 Pulse(bpm): 128 Weight(lbs): 216 Blood Pressure(mmHg): 113/63 Body Mass  Index(BMI): 31 Temperature(F): 98.3 Respiratory 18 Rate(breaths/min): Photos: [24:No Photos] [35:No Photos] [37:No Photos] Wound Location: [24:Left Foot - Dorsal] [35:Left Ischium] [37:Left Malleolus - Lateral] Wounding Event: [24:Gradually Appeared] [35:Gradually Appeared] [37:Gradually Appeared] Primary Etiology: [24:Inflammatory] [35:Pressure Ulcer] [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] Comorbid History: [24:Sleep Apnea, Hypertension, Sleep Apnea, Hypertension, Sleep Apnea, Hypertension, Paraplegia] [35:Paraplegia] [37:Paraplegia] Date Acquired: [24:03/08/2018] [35:04/28/2019] [37:11/26/2019] Weeks of Treatment: [24:97] [35:38] [37:8] Wound Status: [24:Open] [35:Open] [37:Open] Clustered Wound: [24:Yes] [35:Yes] [37:No] Clustered Quantity: [24:1] [35:1] [37:N/A] Measurements L x W x D 3.2x2.8x0.4 [35:2.4x1.2x0.1] [37:3x1.4x0.1] (cm) Area (cm) : [24:7.037] [35:2.262] [37:3.299] Volume (cm) : [24:2.815] [35:0.226] [37:0.33] % Reduction in Area: [24:-2881.80%] [35:84.00%] [37:16.60%] % Reduction in Volume: [24:-11629.20%]  [35:84.00%] [37:16.70%] Classification: [24:Full Thickness Without Exposed Support Structures] [35:Category/Stage III] [37:Full Thickness Without Exposed Support Structures] Exudate Amount: [24:Large] [35:Medium] [37:Medium] Exudate Type: [24:Serosanguineous] [35:Serosanguineous] [37:Serosanguineous] Exudate Color: [24:red, brown] [35:red, brown] [37:red, brown] Wound Margin: [24:Distinct, outline attached Flat and Intact] [37:Flat and Intact] Granulation Amount: [24:Small (1-33%)] [35:Large (67-100%)] [37:Small (1-33%)] Granulation Quality: [24:Red] [35:Red, Friable] [37:N/A] Necrotic Amount: [24:Large (67-100%)] [35:None Present (0%)] [37:Large (67-100%)] Exposed Structures: [24:Fat Layer (Subcutaneous Fat Layer (Subcutaneous Tissue) Exposed: Yes Fascia: No Tendon: No Muscle: No Joint: No Bone: No] [35:Tissue) Exposed: Yes Fascia: No Tendon: No Muscle: No Joint: No Bone: No] [37:Fat Layer (Subcutaneous Tissue) Exposed: Yes  Fascia: No Tendon: No Muscle: No Joint: No Bone: No] Epithelialization: [24:None] [35:Small (1-33%)] [37:Small (1-33%)] Debridement: [24:Debridement - Excisional N/A] [37:Debridement - Excisional] Pre-procedure [24:08:28] [35:N/A] [37:08:28] Verification/Time Out Taken: Tissue Debrided: [24:Subcutaneous, Slough] [35:N/A] [37:Subcutaneous, Slough] Level: [24:Skin/Subcutaneous Tissue] [35:N/A] [37:Skin/Subcutaneous Tissue] Debridement Area (sq cm):8.96 [35:N/A] [37:4.2] Instrument: [24:Curette] [35:N/A] [37:Curette] Bleeding: [24:Minimum] [35:N/A] [37:Minimum] Hemostasis Achieved: [24:Pressure] [35:N/A] [37:Pressure] Procedural Pain: [24:0] [35:N/A] [37:0] Post Procedural Pain: [24:0] [35:N/A] [37:0] Debridement Treatment Procedure was tolerated [35:N/A] [37:Procedure was tolerated] Response: [24:well] [37:well] Post Debridement [24:3.2x2.8x0.4] [35:N/A] [37:3x1.4x0.1] Measurements L x W x D (cm) Post Debridement [24:2.815] [35:N/A] [37:0.33] Volume:  (cm) Procedures Performed: Debridement [24:38] [35:N/A N/A] [37:Compression Therapy Debridement N/A] Photos: [24:No Photos] [35:N/A] [37:N/A] Wound Location: [24:Right Toe - Web between N/A 1st and 2nd] [37:N/A] Wounding Event: [24:Gradually Appeared] [35:N/A] [37:N/A] Primary Etiology: [24:Inflammatory] [35:N/A] [37:N/A] Comorbid History: [24:Sleep Apnea, Hypertension, N/A Paraplegia] [37:N/A] Date Acquired: [24:11/30/2019] [35:N/A] [37:N/A] Weeks of Treatment: [24:7] [35:N/A] [37:N/A] Wound Status: [24:Open] [35:N/A] [37:N/A] Clustered Wound: [24:No] [35:N/A] [37:N/A] Clustered Quantity: [24:N/A] [35:N/A] [37:N/A] Measurements L x W x D 3.7x2.9x0.6 [35:N/A] [37:N/A] (cm) Area (cm) : [24:8.427] [35:N/A] [37:N/A] Volume (cm) : [24:5.056] [35:N/A] [37:N/A] % Reduction in Area: [24:-2453.60%] [35:N/A] [37:N/A] % Reduction in Volume: -2088.70% [35:N/A] [37:N/A] Classification: [24:Full Thickness Without Exposed Support Structures] [35:N/A] [37:N/A] Exudate Amount: [24:Medium] [35:N/A] [37:N/A] Exudate Type: [24:Serosanguineous] [35:N/A] [37:N/A] Exudate Color: [24:red, brown] [35:N/A] [37:N/A] Wound Margin: [24:Flat and Intact] [35:N/A] [37:N/A] Granulation Amount: [24:Small (1-33%)] [35:N/A] [37:N/A] Granulation Quality: [24:Red] [35:N/A] [37:N/A] Necrotic Amount: [24:Large (67-100%)] [35:N/A] [37:N/A] Exposed Structures: [24:Fat Layer (Subcutaneous Tissue) Exposed: Yes Fascia: No Tendon: No Muscle: No Joint: No Bone: No] [35:N/A] [37:N/A] Epithelialization: [24:Small (1-33%)] [35:N/A] [37:N/A] Debridement: [24:N/A] [35:N/A] [37:N/A] Tissue Debrided: [24:N/A] [35:N/A] [37:N/A] Level: [24:N/A] [35:N/A] [37:N/A] Debridement Area (sq cm):N/A [35:N/A] [37:N/A] Instrument: [24:N/A] [35:N/A] [37:N/A] Bleeding: [24:N/A] [35:N/A] [37:N/A] Hemostasis Achieved: [24:N/A] [35:N/A] [37:N/A] Procedural Pain: [24:N/A] [35:N/A] [37:N/A] Post Procedural Pain: [24:N/A] [35:N/A]  [37:N/A] Debridement Treatment N/A [35:N/A] [37:N/A] Response: Post Debridement [24:N/A] [35:N/A] [37:N/A] Measurements L x W x D (cm) Post Debridement [24:N/A] [35:N/A] [37:N/A] Volume: (cm) Procedures Performed: N/A [35:N/A] [37:N/A] Treatment Notes Electronic Signature(s) Signed: 01/21/2020 5:45:55 PM By: Linton Ham  MD Signed: 01/21/2020 5:57:14 PM By: Levan Hurst RN, BSN Entered By: Linton Ham on 01/21/2020 08:34:10 -------------------------------------------------------------------------------- Multi-Disciplinary Care Plan Details Patient Name: Date of Service: [name] 01/21/2020 7:30 AM Medical Record [id number] Patient Account Number: [id number] Date of Birth/Sex: Treating RN: 03-11-1986 (34 y.o. [name] Primary Care Miner Koral: [name], [name] Other Clinician: Referring Palmyra Rogacki: Treating Ezri Landers/Extender:Robson, [name], [name]: 211 Active Inactive Wound/Skin Impairment Nursing Diagnoses: Impaired tissue integrity Knowledge deficit related to ulceration/compromised skin integrity Goals: Patient/caregiver will verbalize understanding of skin care regimen Date Initiated: 01/05/2016 Target Resolution Date: 02/15/2020 Goal Status: Active Ulcer/skin breakdown will have a volume reduction of 30% by week 4 Date Initiated: 01/05/2016 Date Inactivated: 12/22/2017 Target Resolution Date: 01/19/2018 Unmet Reason: complex Goal Status: Unmet wounds, infection Interventions: Assess patient/caregiver ability to obtain necessary supplies Assess ulceration(s) every visit Provide education on ulcer and skin care Notes: Electronic Signature(s) Signed: 01/21/2020 5:57:14 PM By: Levan Hurst RN, BSN Entered By: Levan Hurst on 01/21/2020 07:52:46 -------------------------------------------------------------------------------- Pain Assessment Details Patient Name: Date of Service: [name], [name] 01/21/2020 7:30 AM Medical Record  [id number] Patient Account Number: [id number] Date of Birth/Sex: Treating RN: 08-31-1986 (34 y.o. [name] Primary Care Xadrian Craighead: [place], [place] Other Clinician: Referring Easton Fetty: Treating Verdis Bassette/Extender:Robson, [name], [name]: 211 Active Problems Location of Pain Severity and Description of Pain Patient Has Paino No Site Locations Rate the pain. Current Pain Level: 0 Pain Management and Medication Current Pain Management: Electronic Signature(s) Signed: 01/22/2020 4:52:50 PM By: Baruch Gouty RN, BSN Entered By: Baruch Gouty on 01/21/2020 08:17:04 -------------------------------------------------------------------------------- Patient/Caregiver Education Details Patient Name: Date of Service: [name] 3/1/2021andnbsp7:30 AM Medical Record [id number] Patient Account Number: [id number] Date of Birth/Gender: September 26, 1986 (33 y.o. M) Treating RN: Levan Hurst Primary Care Physician: Janine Limbo Other Clinician: Referring Physician: Treating Physician/Extender:Robson, Pecola Leisure in Treatment: 29 Education Assessment Education Provided To: Patient Education Topics Provided Wound/Skin Impairment: Methods: Explain/Verbal Responses: State content correctly Electronic Signature(s) Signed: 01/21/2020 5:57:14 PM By: Levan Hurst RN, BSN Entered By: Levan Hurst on 01/21/2020 07:55:10 -------------------------------------------------------------------------------- Wound Assessment Details Patient Name: Date of Service: [name], [name] 01/21/2020 7:30 AM Medical Record [id number] Patient Account Number: [id number] Date of Birth/Sex: Treating RN: 1986/11/04 (34 y.o. [name] Primary Care Mirriam Vadala: [place], [place] Other Clinician: Referring Dashawna Delbridge: Treating Gwen Edler/Extender:Robson, [name], [name]: 211 Wound Status Wound Number: 24 Primary Etiology:  Inflammatory Wound Location: Left Foot - Dorsal Wound Status: Open Wounding Event: Gradually Appeared Comorbid Sleep Apnea, Hypertension, History: Paraplegia Date Acquired: 03/08/2018 Weeks Of Treatment: 97 Clustered Wound: Yes Photos Wound Measurements Length: (cm) 3.2 % Reduct Width: (cm) 2.8 % Reduct Depth: (cm) 0.4 Epitheli Clustered Quantity: 1 Tunnelin Area: (cm) 7.037 Undermi Volume: (cm) 2.815 Wound Description Classification: Full Thickness Without Exposed Support Foul Odo Structures Slough/F Wound Distinct, outline attached Margin: Exudate Large Amount: Exudate Serosanguineous Type: Exudate red, brown Color: Wound Bed Granulation Amount: Small (1-33%) Granulation Quality: Red Fascia E Necrotic Amount: Large (67-100%) Fat Laye Necrotic Quality: Adherent Slough Tendon E Muscle E Joint Ex Bone Exp r After Cleansing: No ibrino Yes Exposed Structure xposed: No r (Subcutaneous Tissue) Exposed: Yes xposed: No xposed: No posed: No osed: No ion in Area: -2881.8% ion in Volume: -11629.2% alization: None g: No ning: No Electronic Signature(s) Signed: 01/25/2020 4:01:29 PM By: Mikeal Hawthorne EMT/HBOT Signed: 03/05/2020 9:05:01 AM By: Levan Hurst RN, BSN Previous Signature: 01/22/2020 4:52:50 PM Version By: Baruch Gouty RN, BSN Entered By: Ronnald Ramp,  Dedrick on 01/25/2020 13:27:55 -------------------------------------------------------------------------------- Wound Assessment Details Patient Name: Date of Service: [name], [name] 01/21/2020 7:30 AM Medical Record [id number] Patient Account Number: [id number] Date of Birth/Sex: Treating RN: 27-Jul-1986 (34 y.o. [name] Primary Care Newell Wafer: [place], [place] Other Clinician: Referring Montine Hight: Treating Verlaine Embry/Extender:Robson, [name], [name]: 211 Wound Status Wound Number: 35 Primary Etiology: Pressure Ulcer Wound Location: Left Ischium Wound Status:  Open Wounding Event: Gradually Appeared Comorbid Sleep Apnea, Hypertension, History: Paraplegia Date Acquired: 04/28/2019 Weeks Of Treatment: 38 Clustered Wound: Yes Photos Wound Measurements Length: (cm) 2.4 % Reductio Width: (cm) 1.2 % Reductio Depth: (cm) 0.1 Epithelial Clustered Quantity: 1 Tunneling: Area: (cm) 2.262 Undermini Volume: (cm) 0.226 Wound Description Classification: Category/Stage III Wound Margin: Flat and Intact Exudate Amount: Medium Exudate Type: Serosanguineous Exudate Color: red, brown Wound Bed Granulation Amount: Large (67-100%) Granulation Quality: Red, Friable Necrotic Amount: None Present (0%) Foul Odor After Cleansing: No Slough/Fibrino No Exposed Structure Fascia Exposed: No Fat Layer (Subcutaneous Tissue) Exposed: Yes Tendon Exposed: No Muscle Exposed: No Joint Exposed: No Bone Exposed: No n in Area: 84% n in Volume: 84% ization: Small (1-33%) No ng: No Electronic Signature(s) Signed: 01/25/2020 4:01:29 PM By: Mikeal Hawthorne EMT/HBOT Signed: 03/05/2020 9:05:01 AM By: Levan Hurst RN, BSN Previous Signature: 01/22/2020 4:52:50 PM Version By: Baruch Gouty RN, BSN Entered By: Mikeal Hawthorne on 01/25/2020 13:33:13 -------------------------------------------------------------------------------- Wound Assessment Details Patient Name: Date of Service: [name], [name] 01/21/2020 7:30 AM Medical Record [id number] Patient Account Number: [id number] Date of Birth/Sex: Treating RN: Dec 15, 1985 (34 y.o. [name] Primary Care Jaylynne Birkhead: [place], [place] Other Clinician: Referring Josefita Weissmann: Treating Adib Wahba/Extender:Robson, [name], [name]: 211 Wound Status Wound Number: 37 Primary Etiology: Venous Leg Ulcer Wound Location: Left Malleolus - Lateral Wound Status: Open Wounding Event: Gradually Appeared Comorbid Sleep Apnea, Hypertension, History: Paraplegia Date Acquired: 11/26/2019 Weeks Of Treatment:  8 Clustered Wound: No Photos Wound Measurements Length: (cm) 3 % Reduct Width: (cm) 1.4 % Reduct Depth: (cm) 0.1 Epitheli Area: (cm) 3.299 Tunneli Volume: (cm) 0.33 Undermi Wound Description Classification: Full Thickness Without Exposed Support Foul Odo Structures Slough/F Wound Flat and Intact Margin: Exudate Medium Amount: Exudate Serosanguineous Type: Exudate red, brown Color: Wound Bed Granulation Amount: Small (1-33%) Necrotic Amount: Large (67-100%) Fascia E Necrotic Quality: Adherent Slough Fat Laye Tendon E Muscle E Joint Ex Bone Exp r After Cleansing: No ibrino Yes Exposed Structure xposed: No r (Subcutaneous Tissue) Exposed: Yes xposed: No xposed: No posed: No osed: No ion in Area: 16.6% ion in Volume: 16.7% alization: Small (1-33%) ng: No ning: No Electronic Signature(s) Signed: 01/24/2020 3:59:23 PM By: Mikeal Hawthorne EMT/HBOT Signed: 01/24/2020 5:39:26 PM By: Levan Hurst RN, BSN Previous Signature: 01/22/2020 4:52:50 PM Version By: Baruch Gouty RN, BSN Entered By: Mikeal Hawthorne on 01/24/2020 15:30:46 -------------------------------------------------------------------------------- Wound Assessment Details Patient Name: Date of Service: [name], [name] 01/21/2020 7:30 AM Medical Record [id number] Patient Account Number: [id number] Date of Birth/Sex: Treating RN: 03/30/1986 (34 y.o. [name] Primary Care Amad Mau: [place], [place] Other Clinician: Referring Andreal Vultaggio: Treating Lynzee Lindquist/Extender:Robson, [name], [name]: 211 Wound Status Wound Number: 38 Primary Etiology: Inflammatory Wound Location: Right Toe - Web between 1st and Wound Status: Open 2nd Comorbid Sleep Apnea, Hypertension, Wounding Event: Gradually Appeared History: Paraplegia Date Acquired: 11/30/2019 Weeks Of Treatment: 7 Clustered Wound: No Photos Wound Measurements Length: (cm) 3.7 % Reduct Width: (cm) 2.9 % Reduct Depth: (cm)  0.6 Epitheli Area: (cm) 8.427 Tunneli Volume: (cm) 5.056 Undermi Wound Description  Classification: Full Thickness Without Exposed Support Foul Odo Structures Slough/F Wound Flat and Intact Margin: Exudate Medium Amount: Exudate Serosanguineous Type: Exudate red, brown Color: Wound Bed Granulation Amount: Small (1-33%) Granulation Quality: Red Fascia Necrotic Amount: Large (67-100%) Fat Lay Necrotic Quality: Adherent Slough Tendon Muscle Joint E Bone Ex r After Cleansing: No ibrino Yes Exposed Structure Exposed: No er (Subcutaneous Tissue) Exposed: Yes Exposed: No Exposed: No xposed: No posed: No ion in Area: -2453.6% ion in Volume: -2088.7% alization: Small (1-33%) ng: No ning: No Electronic Signature(s) Signed: 01/25/2020 4:01:29 PM By: Mikeal Hawthorne EMT/HBOT Signed: 03/05/2020 9:05:01 AM By: Levan Hurst RN, BSN Previous Signature: 01/22/2020 4:52:50 PM Version By: Baruch Gouty RN, BSN Entered By: Mikeal Hawthorne on 01/25/2020 13:30:19 -------------------------------------------------------------------------------- [place] Details Patient Name: Date of Service: [name], [name] 01/21/2020 7:30 AM Medical Record [id number] Patient Account Number: [id number] Date of Birth/Sex: Treating RN: [birth year] (34 y.o. [name] Primary Care Camar Guyton: [place], [place] Other Clinician: Referring Aleiyah Halpin: Treating Kya [place]field/Extender:Robson, [name], [name]: 211 Vital Signs Time Taken: 07:53 Temperature (F): 98.3 Height (in): 70 Pulse (bpm): 128 Source: Stated Respiratory Rate (breaths/min): 18 Weight (lbs): 216 Blood Pressure (mmHg): 113/63 Source: Stated Reference Range: 80 - 120 mg / dl Body Mass Index (BMI): 31 Electronic Signature(s) Signed: 01/22/2020 4:52:50 PM By: Baruch Gouty RN, BSN Entered By: Baruch Gouty on 01/21/2020 07:54:22

## 2020-01-28 ENCOUNTER — Encounter (HOSPITAL_BASED_OUTPATIENT_CLINIC_OR_DEPARTMENT_OTHER): Payer: BC Managed Care – PPO | Admitting: Internal Medicine

## 2020-01-28 ENCOUNTER — Other Ambulatory Visit: Payer: Self-pay | Admitting: Internal Medicine

## 2020-01-28 ENCOUNTER — Ambulatory Visit
Admission: RE | Admit: 2020-01-28 | Discharge: 2020-01-28 | Disposition: A | Discharge status: Home / Self Care | Payer: BC Managed Care – PPO | Source: Ambulatory Visit | Attending: Internal Medicine | Admitting: Internal Medicine

## 2020-01-28 ENCOUNTER — Other Ambulatory Visit: Payer: Self-pay

## 2020-01-28 DIAGNOSIS — Z87891 Personal history of nicotine dependence: Secondary | ICD-10-CM | POA: Diagnosis not present

## 2020-01-28 DIAGNOSIS — Z880 Allergy status to penicillin: Secondary | ICD-10-CM | POA: Diagnosis not present

## 2020-01-28 DIAGNOSIS — M7989 Other specified soft tissue disorders: Secondary | ICD-10-CM | POA: Diagnosis not present

## 2020-01-28 DIAGNOSIS — L97511 Non-pressure chronic ulcer of other part of right foot limited to breakdown of skin: Secondary | ICD-10-CM | POA: Diagnosis not present

## 2020-01-28 DIAGNOSIS — L03116 Cellulitis of left lower limb: Secondary | ICD-10-CM | POA: Diagnosis not present

## 2020-01-28 DIAGNOSIS — Z89422 Acquired absence of other left toe(s): Secondary | ICD-10-CM | POA: Diagnosis not present

## 2020-01-28 DIAGNOSIS — L89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, stage 3: Secondary | ICD-10-CM | POA: Diagnosis not present

## 2020-01-28 DIAGNOSIS — Z882 Allergy status to sulfonamides status: Secondary | ICD-10-CM | POA: Diagnosis not present

## 2020-01-28 DIAGNOSIS — L97529 Non-pressure chronic ulcer of other part of left foot with unspecified severity: Secondary | ICD-10-CM | POA: Diagnosis not present

## 2020-01-28 DIAGNOSIS — L97322 Non-pressure chronic ulcer of left 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: Secondary | ICD-10-CM | POA: Diagnosis not present

## 2020-01-28 DIAGNOSIS — L97521 Non-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: Secondary | ICD-10-CM | POA: Diagnosis not present

## 2020-01-28 DIAGNOSIS — L89329 Pressure ulcer of left buttock, unspecified stage: Secondary | ICD-10-CM | POA: Diagnosis not present

## 2020-01-28 DIAGNOSIS — G8221 Paraplegia, complete: Secondary | ICD-10-CM | POA: Diagnosis not present

## 2020-01-28 DIAGNOSIS — L97519 Non-pressure chronic ulcer of other part of right foot with unspecified severity: Secondary | ICD-10-CM | POA: Diagnosis not present

## 2020-01-28 DIAGNOSIS — I87332 Chronic venous hypertension (idiopathic) with ulcer and inflammation of left lower extremity: Secondary | ICD-10-CM | POA: Diagnosis not present

## 2020-01-28 DIAGNOSIS — Z881 Allergy status to other antibiotic agents status: Secondary | ICD-10-CM | POA: Diagnosis not present

## 2020-01-28 DIAGNOSIS — L97321 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 to breakdown of skin: Secondary | ICD-10-CM | POA: Diagnosis not present

## 2020-01-28 DIAGNOSIS — L97522 Non-pressure chronic ulcer of other part of left foot with fat layer exposed: Secondary | ICD-10-CM | POA: Diagnosis not present

## 2020-01-28 NOTE — Progress Notes (Signed)
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 been refractory to multiple dressing attempts. He also has areas in his first and second toes bilaterally in the anterior and posterior web space. I had been using Iodoflex here after a prolonged course of silver alginate with ketoconazole was ineffective [question tinea pedis] 07/14/17; patient arrives today with a very difficult adherent material over his left lateral lower leg wound. He also has surrounding erythema and poorly controlled edema. He was switched his Santyl last visit which the nurses are applying once during his doctor visit and once on a nurse visit. He was also reduced to 2 layer compression I'm not exactly sure of the issue here. 07/21/17; better surface today after 1 week of Iodoflex. Significant cellulitis that we treated last week also better. [Doxycycline] 07/28/17 better surface today with now 2 weeks of Iodoflex. Significant cellulitis treated with doxycycline. He has now completed the doxycycline and he is back to his usual degree of chronic venous  inflammation/stasis dermatitis. He reminds me he has had ablations surgery here 08/04/17; continued improvement with Iodoflex to the left lateral  leg wound in terms of the surface of the wound although the dimensions are better. 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 Problematic areas on the plantar aspect of the first second toe web space on the left and the dorsal aspect of the first second toe web space on the right. At one point I felt these were probably related to chronic fungal infections in treated him aggressively for this although we have not made any improvement here. 08/11/17; left lateral leg. Surface continues to improve with the Iodoflex although we are not seeing much improvement in overall wound dimensions. Areas on his plantar left foot and right foot show no improvement. In fact the right foot looks somewhat worse 08/18/17; left lateral leg. We changed to [facility] Blue last week after a prolonged course of Iodoflex which helps get the surface better. It appears that the wound with is improved. Continue with difficult areas on the left dorsal first second and plantar first second on the right 09/01/17; patient arrives in clinic today having had a temperature of 103 yesterday. He was seen in the ER and [facility]. The patient was concerned he could have cellulitis again in the right leg however they diagnosed him with a UTI and he is now on Keflex. He has a history of cellulitis which is been recurrent and difficult but this is been in the left leg, in the past 5 use doxycycline. He does in and out catheterizations at home which are risk factors for UTI 09/08/17; patient will be completing his Keflex this weekend. The erythema on the left leg is considerably better. He has a new wound today on the medial part of the right leg small superficial almost looks like a skin tear. He has worsening of the area on the right dorsal first and second toe. His major area on the left lateral leg is better. Using Hydrofera Blue on all areas 09/15/17; gradual  reduction in width on the long wound in the left lateral leg. No debridement required. He also has wounds on the plantar aspect of his left first second toe web space and on the dorsal aspect of the right first second toe web space. 09/22/17; there continues to be very gradual improvements in the dimensions of the left lateral leg wound. He hasn't round erythematous spot with might be pressure on his wheelchair. There is no evidence obviously of infection no purulence no warmth He has a dry scaled area on the plantar aspect of the left first second toe Improved area on the dorsal right first second toe. 09/29/17; left lateral leg wound continues to improve in dimensions mostly with an is still a fairly long but increasingly narrow wound. He has a dry scaled area on the plantar aspect of his left first second toe web space Increasingly concerning area on the dorsal right first second toe. In fact I am concerned today about possible cellulitis around this wound. The areas extending up his second toe and although there is deformities here almost appears to abut on the nailbed. 10/06/17; left lateral leg wound continues to make very gradual progress. Tissue culture I did from the right first second toe dorsal foot last time grew MRSA and enterococcus which was vancomycin sensitive. This was not sensitive to clindamycin or doxycycline. He is allergic to Zyvox and sulfa we  have therefore arrange for him to have dalvance infusion tomorrow. He is had this in the past and tolerated it well 10/20/17; left lateral leg wound continues to make decent progress. This is certainly reduced in terms of with there is advancing epithelialization.The cellulitis in the right foot looks better although he still has a deep wound in the dorsal aspect of the first second toe web space. Plantar left first toe web space on the left I think is making some progress 10/27/17; left lateral leg wound continues to make decent  progress. Advancing epithelialization.using Hydrofera Blue The right first second toe web space wound is better-looking using silver alginate Improvement in the left plantar first second toe web space. Again using silver alginate 11/03/17 left lateral leg wound continues to make decent progress albeit slowly. Using [facility] The right per second toe web space continues to be a very problematic looking punched out wound. I obtained a piece of tissue for deep culture I did extensively treated this for fungus. It is difficult to imagine that this is a pressure area as the patient states other than going outside he doesn't really wear shoes at home The left plantar first second toe web space looked fairly senescent. Necrotic edges. This required debridement change to [facility] Blue to all wound areas 11/10/17; left lateral leg wound continues to contract. Using Hydrofera Blue On the right dorsal first second toe web space dorsally. Culture I did of this area last week grew MRSA there is not an easy oral option in this patient was multiple antibiotic allergies or intolerances. This was only a rare culture isolate I'm therefore going to use Bactroban under silver alginate On the left plantar first second toe web space. Debridement is required here. This is also unchanged 11/17/17; left lateral leg wound continues to contract using Hydrofera Blue this is no longer the major issue. The major concern here is the right first second toe web space. He now has an open area going from dorsally to the plantar aspect. There is now wound on the inner lateral part of the first toe. Not a very viable surface on this. There is erythema spreading medially into the forefoot. No major change in the left first second toe plantar wound 11/24/17; left lateral leg wound continues to contract using Hydrofera Blue. Nice improvement today The right first second toe web space all of this looks a lot less angry than last  week. I have given him clindamycin and topical Bactroban for MRSA and terbinafine for the possibility of underlining tinea pedis that I could not control with ketoconazole. Looks somewhat better The area on the plantar left first second toe web space is weeping with dried debris around the wound 12/01/17; left lateral leg wound continues to contract he Hydrofera Blue. It is becoming thinner in terms of with nevertheless it is making good improvement. The right first second toe web space looks less angry but still a large necrotic-looking wounds starting on the plantar aspect of the right foot extending between the toes and now extensively on the base of the right second toe. I gave him clindamycin and topical Bactroban for MRSA anterior benefiting for the possibility of underlying tinea pedis. Not looking better today The area on the left first/second toe looks better. Debrided of necrotic debris 12/05/17* the patient was worked in urgently today because over the weekend he found blood on his incontinence bad when he woke up. He was found to have an ulcer by his wife who  does most of his wound care. He came in today for [place] to look at this. He has not had a history of wounds in his buttocks in spite of his paraplegia. 12/08/17; seen in follow-up today at his usual appointment. He was seen earlier this week and found to have a new wound on his buttock. We also follow him for wounds on the left lateral leg, left first second toe web space and right first second toe web space 12/15/17; we have been using Hydrofera Blue to the left lateral leg which has improved. The right first second toe web space has also improved. Left first second toe web space plantar aspect looks stable. The left buttock has worsened using Santyl. Apparently the buttock has drainage 12/22/17; we have been using Hydrofera Blue to the left lateral leg which continues to improve now 2 small wounds separated by normal skin. He tells  [place] he had a fever up to 100 yesterday he is prone to UTIs but has not noted anything different. He does in and out catheterizations. The area between the first and second toes today does not look good necrotic surface covered with what looks to be purulent drainage and erythema extending into the third toe. I had gotten this to something that I thought look better last time however it is not look good today. He also has a necrotic surface over the buttock wound which is expanded. I thought there might be infection under here so I removed a lot of the surface with a #5 curet though nothing look like it really needed culturing. He is been using Santyl to this area 12/27/17; his original wound on the left lateral leg continues to improve using Hydrofera Blue. I gave him samples of Baxdella although he was unable to take them out of fear for an allergic reaction ["lump in his throat"].the culture I did of the purulent drainage from his second toe last week showed both enterococcus and a set Enterobacter I was also concerned about the erythema on the bottom of his foot although paradoxically although this looks somewhat better today. 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 We've been using silver alginate here. They came up today that he sleeps in a recliner, I'm not sure why but I asked him to stop this 01/03/18; his original wound we've been using Hydrofera Blue is now separated into 2 areas. Ulcer on his left buttock is better he is off the recliner and sleeping in bed Finally both wound areas between his first and second toes also looks some better 01/10/18; his original wound on the left lateral leg is now separated into 2 wounds we've been using Hydrofera Blue Ulcer on his left buttock has some drainage. There is a small probing site going into muscle layer superiorly.using silver alginate -He arrives  today with a deep tissue injury on the left heel The wound on the dorsal aspect of his first second toe on the left looks a lot betterusing silver alginate ketoconazole The area on the first second toe web space on the right also looks a lot bette 01/17/18; his original wound on the left lateral leg continues to progress using Hydrofera Blue Ulcer on his left buttock also is smaller surface healthier except for a small probing site going into the muscle layer superiorly. 2.4 cm of tunneling in this area DTI on his left heel we have only been offloading.  Looks better than last week no threatened open no evidence of infection the wound on the dorsal aspect of the first second toe on the left continues to look like it's regressing we have only been using silver alginate and terbinafine orally The area in the first second toe web space on the right also looks to be a lot better using silver alginate and terbinafine I think this was prompted by tinea pedis 01/31/18; the patient was hospitalized in [place] last week apparently for a complicated UTI. He was discharged on cefepime he does in and out catheterizations. In the hospital he was discovered M I don't mild elevation of AST and ALTs and the terbinafine was stopped.predictably the pressure ulcer on his buttock looks betterusing silver alginate. The area on the left lateral leg also is better using Hydrofera Blue. The area between the first and second toes on the left better. First and second toes on the right still substantial but better. Finally the DTI on the left heel has held together and looks like it's resolving 02/07/18-he is here in follow-up evaluation for multiple ulcerations. He has new injury to the lateral aspect of the last issue a pressure ulcer, he states this is from adhesive removal trauma. He states he has tried multiple adhesive products with no success. All other ulcers appear stable. The left heel DTI is resolving. We will  continue with same treatment plan and follow-up next week. 02/14/18; follow-up for multiple areas. He has a new area last week on the lateral aspect of his pressure ulcer more over the posterior trochanter. The original pressure ulcer looks quite stable has healthy granulation. 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 Almost fully epithelialized on the original superior area using Hydrofera Blue DTI on the left heel has peeled off this week to reveal a small superficial wound under denuded skin and subcutaneous tissue Both areas between the first and second toes look better including nothing open on the left 02/21/18; The patient's wounds on his left ischial tuberosity and posterior left greater trochanter actually looked better. He has a large area of irritation around the area which I think is contact dermatitis. I am doubtful that this is fungal His original wound on the left lateral calf continues to improve we have been using Hydrofera Blue There is no open area in the left first second toe web space although there is a lot of thick callus The DTI on the left heel required debridement today of necrotic surface eschar and subcutaneous tissue using silver alginate Finally the area on the right first second toe webspace continues to contract using silver alginate and ketoconazole 02/28/18 Left ischial tuberosity wounds look better using silver alginate. Original wound on the left calf only has one small open area left using Hydrofera Blue DTI on the left heel required debridement mostly removing skin from around this wound surface. Using silver alginate The areas on the right first/second toe web space using silver alginate and ketoconazole 03/08/18 on evaluation today patient appears to be doing decently well as best I can tell in regard to his wounds. This is the first time that I have  seen him as he generally is followed by Dr. Dellia Nims. With that being said none of his wounds appear to be infected he does have an area where there is some skin covering what appears to be a new wound on the left dorsal  surface of his great toe. This is right at the nail bed. With that being said I do believe that debrided away some of the excess skin can be of benefit in this regard. Otherwise he has been tolerating the dressing changes without complication. 03/14/18; patient arrives today with the multiplicity of wounds that we are following. He has not been systemically unwell Original wound on the left lateral calf now only has 2 small open areas we've been using Hydrofera Blue which should continue The deep tissue injury on the left heel requires debridement today. We've been using silver alginate The left first second toe and the right first second toe are both are reminiscence what I think was tinea pedis. Apparently some of the callus Surface between the toes was removed last week when it started draining. Purulent drainage coming from the wound on the ischial tuberosity on the left. 03/21/18-He is here in follow-up evaluation for multiple wounds. 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 He tolerated debridement. The only change to last week's recommendations is to discontinue antifungal cream between toes. He will follow-up next week 03/28/18; following up for multiple wounds;Concern this week is streaking redness and swelling in the right foot. He is going to need antibiotics for this. 03/31/18; follow-up for right foot cellulitis. Streaking redness and swelling in the right foot on 03/28/18. 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 I put him on clindamycin 300 mg every 6 and brought him in for a quick check. He has an open wound between his first and second toes on the right foot as a potential  source. 04/04/18; Right foot cellulitis is resolving he is completing clindamycin. This is truly good news Left lateral calf wound which is initial wound only has one small open area inferiorly this is close to healing out. He has compression stockings. We will use Hydrofera Blue right down to the epithelialization of this Nonviable surface on the left heel which was initially pressure with a DTI. We've been using Hydrofera Blue. I'm going to switch this back to silver alginate Left first second toe/tinea pedis this looks better using silver alginate Right first second toe tinea pedis using silver alginate Large pressure ulcers on theLeft ischial tuberosity. Small wound here Looks better. I am uncertain about the surface over the large wound. Using silver alginate 04/11/18; Cellulitis in the right foot is resolved Left lateral calf wound which was his original wounds still has 2 tiny open areas remaining this is just about closed Nonviable surface on the left heel is better but still requires debridement Left first second toe/tinea pedis still open using silver alginate Right first second toe wound tinea pedis I asked him to go back to using ketoconazole and silver alginate Large pressure ulcers on the left ischial tuberosity this shear injury here is resolved. Wound is smaller. No evidence of infection using silver alginate 04/18/18; Patient arrives with an intense area of cellulitis in the right mid lower calf extending into the right heel area. Bright red and warm. Smaller area on the left anterior leg. He has a significant history of MRSA. He will definitely need antibioticsdoxycycline He now has 2 open areas on the left ischial tuberosity the original large wound and now a satellite area which I think was above his initial satellite areas. Not a wonderful surface on this satellite area surrounding erythema which looks like pressure related. His left lateral calf wound again his  original  wound is just about closed Left heel pressure injury still requiring debridement Left first second toe looks a lot better using silver alginate Right first second toe also using silver alginate and ketoconazole cream also looks better 04/20/18; the patient was worked in early today out of concerns with his cellulitis on the right leg. I had started him on doxycycline. This was 2 days ago. His wife was concerned about the swelling in the area. Also concerned about the left buttock. He has not been systemically unwell no fever chills. No nausea vomiting or diarrhea 04/25/18; the patient's left buttock wound is continued to deteriorate he is using Hydrofera Blue. He is still completing clindamycin for the cellulitis on the right leg although all of this looks better. 05/02/18 Left buttock wound still with a lot of drainage and a very tightly adherent fibrinous necrotic surface. He has a deeper area superiorly The left lateral calf wound is still closed DTI wound on the left heel necrotic surface especially the circumference using Iodoflex Areas between his left first second toe and right first second toe both look better. Dorsally and the right first second toe he had a necrotic surface although at smaller. In using silver alginate and ketoconazole. I did a culture last week which was a deep tissue culture of the reminiscence of the open wound on the right first second toe dorsally. This grew a few Acinetobacter and a few methicillin-resistant staph aureus. Nevertheless the area actually this week looked better. I didn't feel the need to specifically address this at least in terms of systemic antibiotics. 05/09/18; wounds are measuring larger more drainage per our intake. We are using Santyl covered with alginate on the large superficial buttock wounds, Iodosorb on the left heel, ketoconazole and silver alginate to the dorsal first and second toes bilaterally. 05/16/18; The area on his left buttock  better in some aspects although the area superiorly over the ischial tuberosity required an extensive debridement.using Santyl Left heel appears stable. Using Iodoflex The areas between his first and second toes are not bad however there is spreading erythema up the dorsal aspect of his left foot this looks like cellulitis again. He is insensate the erythema is really very brilliant.o Erysipelas He went to see an allergist days ago because he was itching part of this he had lab work done. This showed a white count of 15.1 with 70% neutrophils. Hemoglobin of 11.4 and a platelet count of 659,000. Last white count we had in Epic was a 2-1/2 years ago which was 25.9 but he was ill at the time. He was able to show me some lab work that was done by his primary physician the pattern is about the same. 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 But prompted me to go ahead and do x-rays of both feet and the pelvis rule out osteomyelitis. He also had a comprehensive metabolic panel this was reasonably normal his albumin was 3.7 liver function tests BUN/creatinine all normal 05/23/18; x-rays of both his feet from last week were negative for underlying pulmonary abnormality. The x-ray of his pelvis however showed mild irregularity in the left ischial which may represent some early osteomyelitis. The wound in the left ischial continues to get deeper clearly now exposed muscle. Each week necrotic surface material over this area. Whereas the rest of the wounds do not look so bad. The left ischial wound we have been using Santyl and calcium alginate  To the left heel surface necrotic debris using Iodoflex The left lateral leg is still healed Areas on the left dorsal foot and the right dorsal foot are about the same. There is some inflammation on the left which might represent contact dermatitis, fungal dermatitis I am doubtful cellulitis although this looks better  than last week 05/30/18; CT scan done at Hospital did not show any osteomyelitis or abscess. Suggested the possibility of underlying cellulitis although I don't see a lot of evidence of this at the bedside The wound itself on the left buttock/upper thigh actually looks somewhat better. No debridement Left heel also looks better no debridement continue Iodoflex Both dorsal first second toe spaces appear better using Lotrisone. Left still required debridement 06/06/18; Intake reported some purulent looking drainage from the left gluteal wound. Using Santyl and calcium alginate Left heel looks better although still a nonviable surface requiring debridement The left dorsal foot first/second webspace actually expanding and somewhat deeper. I may consider doing a shave biopsy of this area Right dorsal foot first/second webspace appears stable to improved. Using Lotrisone and silver alginate to both these areas 06/13/18 Left gluteal surface looks better. Now separated in the 2 wounds. No debridement required. Still drainage. We'll continue silver alginate Left heel continues to look better with Iodoflex continue this for at least another week Of his dorsal foot wounds the area on the left still has some depth although it looks better than last week. We've been using Lotrisone and silver alginate 06/20/18 Left gluteal continues to look better healthy tissue Left heel continues to look better healthy granulation wound is smaller. He is using Iodoflex and his long as this continues continue the Iodoflex Dorsal right foot looks better unfortunately dorsal left foot does not. There is swelling and erythema of his forefoot. He had minor trauma to this several days ago but doesn't think this was enough to have caused any tissue injury. Foot looks like cellulitis, we have had this problem before 06/27/18 on evaluation today patient appears to be doing a little worse in regard to his foot ulcer. 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 Both of which would really be his only options for treating this infection. In the past he has been given and effusion of Orbactiv. This is done very well for him in the past again it's one time dosing IV antibiotic therapy. Subsequently I do believe this is something we're gonna need to see about doing at this point in time. Currently his other wounds seem to be doing somewhat better in my pinion I'm pretty happy in that regard. 07/03/18 on evaluation today patient's wounds actually appear to be doing fairly well. He has been tolerating the dressing changes without complication. All in all he seems to be showing signs of improvement. In regard to the antibiotics he has been dealing with infectious disease since I saw him last week as far as getting this scheduled. In the end [facility]'s [facility] this coming Friday. In the meantime he has been continuing to perform the dressing changes in such as previous. There does not appear to be any evidence of infection worsengin at this time. 07/10/18; Since I last saw this man 2 weeks ago things have actually improved. IV antibiotics of resulted in less forefoot erythema  although there is still some present. He is not systemically unwell Left buttock wounds 2 now have no depth there is increased epithelialization Using silver alginate Left heel still requires debridement using Iodoflex Left dorsal foot still with a sizable wound about the size of a border but healthy granulation Right dorsal foot still with a slitlike area using silver alginate 07/18/18; the patient's cellulitis in the left foot is improved in fact I think it is on its way to resolving. Left buttock wounds 2 both look better although the larger one has hypertension  granulation we've been using silver alginate Left heel has some thick circumferential redundant skin over the wound edge which will need to be removed today we've been using Iodoflex Left dorsal foot is still a sizable wound required debridement using silver alginate The right dorsal foot is just about closed only a small open area remains here 07/25/18; left foot cellulitis is resolved Left buttock wounds 2 both look better. Hyper-granulation on the major area Left heel as some debris over the surface but otherwise looks a healthier wound. Using silver collagen Right dorsal foot is just about closed 07/31/18; arrives with our intake nurse worried about purulent drainage from the buttock. We had hyper-granulation here last week His buttock wounds 2 continue to look better Left heel some debris over the surface but measuring smaller. Right dorsal foot unfortunately has openings between the toes Left foot superficial wound looks less aggravated. 08/07/18 Buttock wounds continue to look better although some of her granulation and the larger medial wound. silver alginate Left heel continues to look a lot better.silver collagen Left foot superficial wound looks less stable. Requires debridement. He has a new wound superficial area on the foot on the lateral dorsal foot. Right foot looks better using silver alginate without Lotrisone 08/14/2018; patient was in the ER last week diagnosed with a UTI. He is now on Cefpodoxime and Macrodantin. Buttock wounds continued to be smaller. Using silver alginate Left heel continues to look better using silver collagen Left foot superficial wound looks as though it is improving Right dorsal foot area is just about healed. 08/21/2018; patient is completed his antibiotics for his UTI. He has 2 open areas on the buttocks. There is still not closed although the surface looks satisfactory. Using silver alginate Left heel continues to improve using silver  collagen The bilateral dorsal foot areas which are at the base of his first and second toes/possible tinea pedis are actually stable on the left but worse on the right. The area on the left required debridement of necrotic surface. After debridement I obtained a specimen for PCR culture. The right dorsal foot which is been just about healed last week is now reopened 08/28/2018; culture done on the left dorsal foot showed coag negative staph both staph epidermidis and Lugdunensis. I think this is worthwhile initiating systemic treatment. I will use doxycycline given his long list of allergies. The area on the left heel slightly improved but still requiring debridement. The large wound on the buttock is just about closed whereas the smaller one is larger. Using silver alginate in this area 09/04/2018; patient is completing his doxycycline for the left foot although this continues to be a very difficult wound area with very adherent necrotic debris. We are using silver alginate to all his wounds right foot left foot and the small wounds on his buttock, silver collagen on the left heel. 09/11/2018; once again this patient has intense erythema and swelling of the left forefoot.  Lesser degrees of erythema in the right foot. He has a long list of allergies and intolerances. I will reinstitute doxycycline. 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 Using silver alginate Left heel also looks better using silver collagen Unfortunately both the areas on his feet look worse. The area on the left first second webspace is now gone through to the plantar part of his foot. The area on the left foot anteriorly is irritated with erythema and swelling in the forefoot. 09/25/2018 His wound on the left plantar heel looks better. Using silver collagen The area on the left buttock 2 small remnant areas. One is closed one is still open. Using silver alginate The areas  between both his first and second toes look worse. This in spite of long-standing antifungal therapy with ketoconazole and silver alginate which should have antifungal activity He has small areas around his original wound on the left calf one is on the bottom of the original scar tissue and one superiorly both of these are small and superficial but again given wound history in this site this is worrisome 10/02/2018 Left plantar heel continues to gradually contract using silver collagen Left buttock wound is unchanged using silver alginate The areas on his dorsal feet between his first and second toes bilaterally look about the same. I prescribed clindamycin ointment to see if we can address chronic staph colonization and also the underlying possibility of erythrasma The left lateral lower extremity wound is actually on the lateral part of his ankle. Small open area here. We have been using silver alginate 10/09/2018; Left plantar heel continues to look healthy and contract. No debridement is required Left buttock slightly smaller with a tape injury wound just below which was new this week Dorsal feet somewhat improved I have been using clindamycin Left lateral looks lower extremity the actual open area looks worse although a lot of this is epithelialized. I am going to change to silver collagen today He has a lot more swelling in the right leg although this is not pitting not red and not particularly warm there is a lot of spasm in the right leg usually indicative of people with paralysis of some underlying discomfort. We have reviewed his vascular status from 2017 he had a left greater saphenous vein ablation. I wonder about referring him back to vascular surgery if the area on the left leg continues to deteriorate. 10/16/2018 in today for follow-up and management of multiple lower extremity ulcers. His left Buttock wound is much lower smaller and almost closed completely. The wound to the  left ankle has began to reopen with Epithelialization and some adherent slough. He has multiple new areas to the left foot and leg. The left dorsal foot without much improvement. Wound present between left great webspace and 2nd toe. Erythema and edema present right leg. Right LE ultrasound obtained on 10/10/18 was negative for DVT. 10/23/2018; Left buttock is closed over. Still dry macerated skin but there is no open wound. I suspect this is chronic pressure/moisture Left lateral calf is quite a bit worse than when I saw this last. There is clearly drainage here he has macerated skin into the left plantar heel. We will change the primary dressing to alginate Left dorsal foot has some improvement in overall wound area. Still using clindamycin and silver alginate Right dorsal foot about the same as the left using clindamycin and silver alginate The erythema in the right leg  has resolved. He is DVT rule out was negative Left heel pressure area required debridement although the wound is smaller and the surface is health 10/26/2018 The patient came back in for his nurse check today predominantly because of the drainage coming out of the left lateral leg with a recent reopening of his original wound on the left lateral calf. He comes in today with a large amount of surrounding erythema around the wound extending from the calf into the ankle and even in the area on the dorsal foot. He is not systemically unwell. He is not febrile. Nevertheless this looks like cellulitis. We have been using silver alginate to the area. I changed him to a regular visit and I am going to prescribe him doxycycline. The rationale here is a long list of medication intolerances and a history of MRSA. I did not see anything that I thought would provide a valuable culture 10/30/2018 Follow-up from his appointment 4 days ago with really an extensive area of cellulitis in the left calf left lateral ankle and left dorsal foot. I  put him on doxycycline. He has a long list of medication allergies which are true allergy reactions. 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 In any case he is a lot better today. The erythema especially in the anterior and lateral left calf is better. He still has left ankle erythema. He also is complaining about increasing edema in the right leg we have only been using Kerlix Coban and he has been doing the wraps at home. Finally he has a spotty rash on the medial part of his upper left calf which looks like folliculitis or perhaps wrap occlusion type injury. Small superficial macules not pustules 11/06/18 patient arrives today with again a considerable degree of erythema around the wound on the left lateral calf extending into the dorsal ankle and dorsal foot. This is a lot worse than when I saw this last week. He is on doxycycline really with not a lot of improvement. He has not been systemically unwell Wounds on the; left heel actually looks improved. Original area on the left foot and proximity to the first and second toes looks about the same. He has superficial areas on the dorsal foot, anterior calf and then the reopening of his original wound on the left lateral calf which looks about the same The only area he has on the right is the dorsal webspace first and second which is smaller. He has a large area of dry erythematous skin on the left buttock small open area here. 11/13/2018; the patient arrives in much better condition. The erythema around the wound on the left lateral calf is a lot better. Not sure whether this was the clindamycin or the TCA and ketoconazole or just in the improvement in edema control [stasis dermatitis]. In any case this is a lot better. The area on the left heel is very small and just about resolved using silver collagen we have been using silver alginate to the areas on his dorsal  feet 11/20/2018; his wounds include the left lateral calf, left heel, dorsal aspects of both feet just proximal to the first second webspace. He is stable to slightly improved. I did not think any changes to his dressings were going to be necessary 11/27/2018 he has a reopening on the left buttock which is surrounded by what looks like tinea or perhaps some other form of dermatitis. The area on  the left dorsal foot has some erythema around it I have marked this area but I am not sure whether this is cellulitis or not. Left heel is not closed. Left calf the reopening is really slightly longer and probably worse 1/13; in general things look better and smaller except for the left dorsal foot. Area on the left heel is just about closed, left buttock looks better only a small wound remains in the skin looks better [using Lotrisone] 1/20; the area on the left heel only has a few remaining open areas here. Left lateral calf about the same in terms of size, left dorsal foot slightly larger right lateral foot still not closed. The area on the left buttock has no open wound and the surrounding skin looks a lot better 1/27; the area on the left heel is closed. Left lateral calf better but still requiring extensive debridements. The area on his left buttock is closed. He still has the open areas on the left dorsal foot which is slightly smaller in the right foot which is slightly expanded. We have been using Iodoflex on these areas as well 2/3; left heel is closed. Left lateral calf still requiring debridement using Iodoflex there is no open area on his left buttock however he has dry scaly skin over a large area of this. Not really responding well to the Lotrisone. Finally the areas on his dorsal feet at the level of the first second webspace are slightly smaller on the right and about the same on the left. Both of these vigorously debrided with Anasept and gauze 2/10; left heel remains closed he has dry  erythematous skin over the left buttock but there is no open wound here. Left lateral leg has come in and with. Still requiring debridement we have been using Iodoflex here. Finally the area on the left dorsal foot and right dorsal foot are really about the same extremely dry callused fissured areas. He does not yet have a dermatology appointment 2/17; left heel remains closed. He has a new open area on the left buttock. The area on the left lateral calf is bigger longer and still covered in necrotic debris. No major change in his foot areas bilaterally. I am awaiting for a dermatologist to look on this. We have been using ketoconazole I do not know that this is been doing any good at all. 2/24; left heel remains closed. The left buttock wound that was new reopening last week looks better. The left lateral calf appears better also although still requires debridement. The major area on his foot is the left first second also requiring debridement. We have been putting Prisma on all wounds. I do not believe that the ketoconazole has done too much good for his feet. He will use Lotrisone I am going to give him a 2-week course of terbinafine. We still do not have a dermatology appointment 3/2 left heel remains closed however there is skin over bone in this area I pointed this out to him today. The left buttock wound is epithelialized but still does not look completely stable. The area on the left leg required debridement were using silver collagen here. With regards to his feet we changed to Lotrisone last week and silver alginate. 3/9; left heel remains closed. Left buttock remains closed. The area on the right foot is essentially closed. The left foot remains unchanged. Slightly smaller on the left lateral calf. Using silver collagen to both of these areas 3/16-Left heel remains closed. Area on  right foot is closed. Left lateral calf above the lateral malleolus open wound requiring debridement with  easy bleeding. Left dorsal wound proximal to first toe also debrided. Left ischial area open new. Patient has been using Prisma with wrapping every 3 days. Dermatology appointment is apparently tomorrow.Patient has completed his terbinafine 2-week course with some apparent improvement according to him, there is still flaking and dry skin in his foot on the left 3/23; area on the right foot is reopened. The area on the left anterior foot is about the same still a very necrotic adherent surface. He still has the area on the left leg and reopening is on the left buttock. He apparently saw dermatology although I do not have a note. According to the patient who is usually fairly well informed they did not have any good ideas. Put him on oral terbinafine which she is been on before. 3/30; using silver collagen to all wounds. Apparently his dermatologist put him on doxycycline and rifampin presumably some culture grew staph. I do not have this result. He remains on terbinafine although I have used terbinafine on him before 4/6; patient has had a fairly substantial reopening on the right foot between the first and second toes. He is finished his terbinafine and I believe is on doxycycline and rifampin still as prescribed by dermatology. We have been using silver collagen to all his wounds although the patient reports that he thinks silver alginate does better on the wounds on his buttock. 4/13; the area on his left lateral calf about the same size but it did not require debridement. Left dorsal foot just proximal to the webspace between the first and second toes is about the same. Still nonviable surface. I note some superficial bronze discoloration of the dorsal part of his foot Right dorsal foot just proximal to the first and second toes also looks about the same. I still think there may be the same discoloration I noted above on the left Left buttock wound looks about the same 4/20; left lateral calf  appears to be gradually contracting using silver collagen. 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 The left dorsal foot wound is debrided of tightly adherent necrotic debris and really cleans up quite nicely. The right area is worse with expansion. I did not debride this it is now over the base of the second toe The area on his left buttock is smaller no debridement is required using silver collagen 5/4; left calf continues to make good progress. He arrives with erythema around the wounds on his dorsal foot which even extends to the plantar aspect. Very concerning for coexistent infection. He is finished the erythromycin I gave him for possible erythrasma this does not seem to have helped. The area on the left foot is about the same base of the dorsal toes Is area on the buttock looks improved on the left 5/11; left calf and left buttock continued to make good progress. Left foot is about the same to slightly improved. Major problem is on the right foot. He has not had an x-ray. Deep tissue culture I did last week showed both Enterobacter and E. coli. I did not change the doxycycline I put him on empirically although neither 1 of these were plated to doxycycline. He arrives today with the erythema looking worse on both the dorsal and plantar foot. Macerated skin on the bottom of the foot. he has not been systemically unwell 5/18-Patient returns at 1  week, left calf wound appears to be making some progress, left buttock wound appears slightly worse than last time, left foot wound looks slightly better, right foot redness is marginally better. X-ray of both feet show no air or evidence of osteomyelitis. Patient is finished his Omnicef and terbinafine. He continues to have macerated skin on the bottom of the left foot as well as right 5/26; left calf wound is better, left buttock wound appears to have multiple small superficial open areas  with surrounding macerated skin. X-rays that I did last time showed no evidence of osteomyelitis in either foot. He is finished cefdinir and doxycycline. I do not think that he was on terbinafine. He continues to have a large superficial open area on the right foot anterior dorsal and slightly between the first and second toes. I did send him to dermatology 2 months ago or so wondering about whether they would do a fungal scraping. I do not believe they did but did do a culture. We have been using silver alginate to the toe areas, he has been using antifungals at home topically either ketoconazole or Lotrisone. We are using silver collagen on the left foot, silver alginate on the right, silver collagen on the left lateral leg and silver alginate on the left buttock 6/1; left buttock area is healed. We have the left dorsal foot, left lateral leg and right dorsal foot. We are using silver alginate to the areas on both feet and silver collagen to the area on his left lateral calf 6/8; the left buttock apparently reopened late last week. He is not really sure how this happened. He is tolerating the terbinafine. Using silver alginate to all wounds 6/15; left buttock wound is larger than last week but still superficial. Came in the clinic today with a report of purulence from the left lateral leg I did not identify any infection Both areas on his dorsal feet appear to be better. He is tolerating the terbinafine. Using silver alginate to all wounds 6/22; left buttock is about the same this week, left calf quite a bit better. His left foot is about the same however he comes in with erythema and warmth in the right forefoot once again. Culture that I gave him in the beginning of May showed Enterobacter and E. coli. I gave him doxycycline and things seem to improve although neither 1 of these organisms was specifically plated. 6/29; left buttock is larger and dry this week. Left lateral calf looks to me to  be improved. Left dorsal foot also somewhat improved right foot completely unchanged. The erythema on the right foot is still present. He is completing the Ceftin dinner that I gave him empirically [see discussion above.) 7/6 - All wounds look to be stable and perhaps improved, the left buttock wound is slightly smaller, per patient bleeds easily, completed ceftin, the right foot redness is less, he is on terbinafine 7/13; left buttock wound about the same perhaps slightly narrower. Area on the left lateral leg continues to narrow. Left dorsal foot slightly smaller right foot about the same. We are using silver alginate on the right foot and Hydrofera Blue to the areas on the left. Unna boot on the left 2 layer compression on the right 7/20; left buttock wound absolutely the same. Area on lateral leg continues to get better. Left dorsal foot require debridement as did the right no major change in the 7/27; left buttock wound the same size necrotic debris over the surface. The area  on the lateral leg is closed once again. His left foot looks better right foot about the same although there is some involvement now of the posterior first second toe area. He is still on terbinafine which I have given him for a month, not certain a centimeter major change 06/25/19-All wounds appear to be slightly improved according to report, left buttock wound looks clean, both foot wounds have minimal to no debris the right dorsal foot has minimal slough. We are using Hydrofera Blue to the left and silver alginate to the right foot and ischial wound. 8/10-Wounds all appear to be around the same, the right forefoot distal part has some redness which was not there before, however the wound looks clean and small. Ischial wound looks about the same with no changes 8/17; his wound on the left lateral calf which was his original chronic venous insufficiency wound remains closed. Since I last saw him the areas on the left  dorsal foot right dorsal foot generally appear better but require debridement. The area on his left initial tuberosity appears somewhat larger to me perhaps hyper granulated and bleeds very easily. We have been using Hydrofera Blue to the left dorsal foot and silver alginate to everything else 8/24; left lateral calf remains closed. The areas on his dorsal feet on the webspace of the first and second toes bilaterally both look better. The area on the left buttock which is the pressure ulcer stage II slightly smaller. I change the dressing to Hydrofera Blue to all areas 8/31; left lateral calf remains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 substantial area on his left lateral leg since last time. We have been using Hydrofera Blue for a prolonged period of time now. The area on his left foot is also unchanged from last review 07/19/16; the area on his dorsal foot on the left looks considerably smaller. He is beginning to have significant rims of epithelialization on  the lateral left leg wound. This also looks better. 08/05/16; the patient came in for a nurse visit today. Apparently the area on his left lateral leg looks better and it was wrapped. However in general discussion the patient noted a new area on the dorsal aspect of his right second toe. The exact etiology of this is unclear but likely relates to pressure. 08/09/16 really the area on the left lateral leg did not really look that healthy today perhaps slightly larger and measurements. The area on his dorsal right second toe is improved also the left foot wound looks stable to improved 08/16/16; the area on the last lateral leg did not change any of dimensions. Post debridement with a curet the area looked better. Left foot wound improved and the area on the dorsal right second toe is improved 08/23/16; the area on the left lateral leg may be slightly smaller both in terms of length and width. Aggressive debridement with a curette afterwards the tissue appears healthier. Left foot wound appears improved in the area on the dorsal right second toe is improved 08/30/16 patient developed a fever over the weekend and was seen in an urgent care. Felt to have a UTI and put on doxycycline. He has been since changed over the phone to [facility]. After we took off the wrap on his right leg today the leg is swollen warm and erythematous, probably more likely the source of the fever 09/06/16; have been using collagen to the major left leg wound, silver alginate to the area on his anterior foot/toes 09/13/16; the areas on his anterior foot/toes on both sides appear to be virtually closed. Extensive wound on the left lateral leg perhaps slightly narrower but each visit still covered an adherent surface slough 09/16/16 patient was in for his usual Thursday nurse visit however the intake nurse noted significant erythema of his dorsal right foot. He is also running a low-grade fever and having increasing spasms in the  right leg 09/20/16 here for cellulitis involving his right great toes and forefoot. This is a lot better. Still requiring debridement on his left lateral leg. Santyl direct says he needs prior authorization. Therefore his wife cannot change this at home 09/30/16; the patient's extensive area on the left lateral calf and ankle perhaps somewhat better. Using Santyl. The area on the left toes is healed and I think the area on his right dorsal foot is healed as well. There is no cellulitis or venous inflammation involving the right leg. He is going to need compression stockings here. 10/07/16; the patient's extensive wound on the left lateral calf and ankle does not measure any differently however there appears to be less adherent surface slough using Santyl and aggressive weekly debridements 10/21/16; no major change in the area on the left lateral calf. Still the same measurement  still very difficult to debridement adherent slough and nonviable subcutaneous tissue. This is not really been helped by several weeks of Santyl. Previously for 2 weeks I used Iodoflex for a short period. A prolonged course of Hydrofera Blue didn't really help. I'm not sure why I only used 2 weeks of Iodoflex on this there is no evidence of surrounding infection. He has a small area on the right second toe which looks as though it's progressing towards closure 10/28/16; the wounds on his toes appear to be closed. No major change in the left lateral leg wound although the surface looks somewhat better using Iodoflex. He has had previous arterial studies that were normal. He has had reflux studies and is status post ablation although I don't have any exact notes on which vein was ablated. I'll need to check the surgical record 11/04/16; he's had a reopening between the first and second toe on the left and right. No major change in the left lateral leg wound. There is what appears to be cellulitis of the left dorsal  foot 11/18/16 the patient was hospitalized initially in [place] and then subsequently transferred to [facility] long and was admitted there from 11/09/16 through 11/12/16. He had developed progressive cellulitis on the right leg in spite of the doxycycline I gave him. I'd spoken to the hospitalist in [place] who was concerned about continuing leukocytosis. CT scan is what I suggested this was done which showed soft tissue swelling without evidence of osteomyelitis or an underlying abscess blood cultures were negative. At [facility] he was treated with vancomycin and Primaxin and then add an infectious disease consult. He was transitioned to Ceftaroline. He has been making progressive improvement. Overall a severe cellulitis of the right leg. He is been using silver alginate to her original wound on the left leg. The wounds in his toes on the right are closed there is a small open area on the base of the left second toe 11/26/15; the patient's right leg is much better although there is still some edema here this could be reminiscent from his severe cellulitis likely on top of some degree of lymphedema. His left anterior leg wound has less surface slough as reported by her intake nurse. Small wound at the base of the left second toe 12/02/16; patient's right leg is better and there is no open wound here. His left anterior lateral leg wound continues to have a healthy-looking surface. Small wound at the base of the left second toe however there is erythema in the left forefoot which is worrisome 12/16/16; is no open wounds on his right leg. We took measurements for stockings. His left anterior lateral leg wound continues to have a healthy-looking surface. I'm not sure where we were with the Apligraf run through his insurance. We have been using Iodoflex. He has a thick eschar on the left first second toe interface, I suspect this may be fungal however there is no visible open 12/23/16; no open wound on his  right leg. He has 2 small areas left of the linear wound that was remaining last week. We have been using Prisma, I thought I have disclosed this week, we can only look forward to next week 01/03/17; the patient had concerning areas of erythema last week, already on doxycycline for UTI through his primary doctor. The erythema is absolutely no better there is warmth and swelling both medially from the left lateral leg wound and also the dorsal left foot. 01/06/17- Patient is here for follow-up  evaluation of his left lateral leg ulcer and bilateral feet ulcers. He is on oral antibiotic therapy, tolerating that. Nursing staff and the patient states that the erythema is improved from Monday. 01/13/17; the predominant left lateral leg wound continues to be problematic. I had put Apligraf on him earlier this month once. However he subsequently developed what appeared to be an intense cellulitis around the left lateral leg wound. I gave him Dalvance I think on 2/12 perhaps 2/13 he continues on cefdinir. The erythema is still present but the warmth and swelling is improved. I am hopeful that the cellulitis part of this control. I wouldn't be surprised if there is an element of venous inflammation as well. 01/17/17. The erythema is present but better in the left leg. His left lateral leg wound still does not have a viable surface buttons certain parts of this long thin wound it appears like there has been improvement in dimensions. 01/20/17; the erythema still present but much better in the left leg. I'm thinking this is his usual degree of chronic venous inflammation. The wound on the left leg looks somewhat better. Is less surface slough 01/27/17; erythema is back to the chronic venous inflammation. The wound on the left leg is somewhat better. I am back to the point where I like to try an Apligraf once again 02/10/17; slight improvement in wound dimensions. Apligraf #2. He is completing his doxycycline 02/14/17;  patient arrives today having completed doxycycline last Thursday. This was supposed to be a nurse visit however once again he hasn't tense erythema from the medial part of his wound extending over the lower leg. Also erythema in his foot this is roughly in the same distribution as last time. He has baseline chronic venous inflammation however this is a lot worse than the baseline I have learned to accept the on him is baseline inflammation 02/24/17- patient is here for follow-up evaluation. He is tolerating compression therapy. His voicing no complaints or concerns he is here anticipating an Apligraf 03/03/17; he arrives today with an adherent necrotic surface. I don't think this is surface is going to be amenable for Apligraf's. The erythema around his wound and on the left dorsal foot has resolved he is off antibiotics 03/10/17; better-looking surface today. I don't think he can tolerate Apligraf's. He tells me he had a wound VAC after a skin graft years ago to this area and they had difficulty with a seal. The erythema continues to be stable around this some degree of chronic venous inflammation but he also has recurrent cellulitis. We have been using Iodoflex 03/17/17; continued improvement in the surface and may be small changes in dimensions. Using Iodoflex which seems the only thing that will control his surface 03/24/17- He is here for follow up evaluation of his LLE lateral ulceration and ulcer to right dorsal foot/toe space. He is voicing no complaints or concerns, He is tolerating compression wrap. 03/31/17 arrives today with a much healthier looking wound on the left lower extremity. We have been using Iodoflex for a prolonged period of time which has for the first time prepared and adequate looking wound bed although we have not had much in the way of wound dimension improvement. He also has a small wound between the first and second toe on the right 04/07/17; arrives today with a  healthy-looking wound bed and at least the top 50% of this wound appears to be now her. No debridement was required I have changed him to [facility] last  week after prolonged Iodoflex. He did not do well with Apligraf's. We've had a re-opening between the first and second toe on the right 04/14/17; arrives today with a healthier looking wound bed contractions and the top 50% of this wound and some on the lesser 50%. Wound bed appears healthy. The area between the first and second toe on the right still remains problematic 04/21/17; continued very gradual improvement. Using [facility] 04/28/17; continued very gradual improvement in the left lateral leg venous insufficiency wound. His periwound erythema is very mild. We have been using Hydrofera Blue. Wound is making progress especially in the superior 50% 05/05/17; he continues to have very gradual improvement in the left lateral venous insufficiency wound. Both in terms with an length rings are improving. I debrided this every 2 weeks with #5 curet and we have been using Hydrofera Blue and again making good progress With regards to the wounds between his right first and second toe which I thought might of been tinea pedis he is not making as much progress very dry scaly skin over the area. Also the area at the base of the left first and second toe in a similar condition 05/12/17; continued gradual improvement in the refractory left lateral venous insufficiency wound on the left. Dimension smaller. Surface still requiring debridement using Hydrofera Blue 05/19/17; continued gradual improvement in the refractory left lateral venous ulceration. Careful inspection of the wound bed underlying rumination suggested some degree of epithelialization over the surface no debridement indicated. Continue Hydrofera Blue difficult areas between his toes first and third on the left than first and second on the right. I'm going to change to silver alginate from  silver collagen. Continue ketoconazole as I suspect underlying tinea pedis 05/26/17; left lateral leg venous insufficiency wound. We've been using Hydrofera Blue. I believe that there is expanding epithelialization over the surface of the wound albeit not coming from the wound circumference. This is a bit of an odd situation in which the epithelialization seems to be coming from the surface of the wound rather than in the exact circumference. There is still small open areas mostly along the lateral margin of the wound. ooHe has unchanged areas between the left first and second and the right first second toes which I been treating for tenia pedis 06/02/17; left lateral leg venous insufficiency wound. We have been using Hydrofera Blue. Somewhat smaller from the wound circumference. The surface of the wound remains a bit on it almost epithelialized sedation in appearance. I use an open curette today debridement in the surface of all of this especially the edges ooSmall open wounds remaining on the dorsal right first and second toe interspace and the plantar left first second toe and her face on the left 06/09/17; wound on the left lateral leg continues to be smaller but very gradual and very dry surface using Hydrofera Blue 06/16/17 requires weekly debridements now on the left lateral leg although this continues to contract. I changed to silver collagen last week because of dryness of the wound bed. Using Iodoflex to the areas on his first and second toes/web space bilaterally 06/24/17; patient with history of paraplegia also chronic venous insufficiency with lymphedema. Has a very difficult wound on the left lateral leg. This has been gradually reducing in terms of with but comes in with a very dry adherent surface. High switch to silver collagen a week or so ago with hydrogel to keep the area moist. This is been refractory to multiple dressing attempts. He  also has areas in his first and second toes  bilaterally in the anterior and posterior web space. I had been using Iodoflex here after a prolonged course of silver alginate with ketoconazole was ineffective [question tinea pedis] 07/14/17; patient arrives today with a very difficult adherent material over his left lateral lower leg wound. He also has surrounding erythema and poorly controlled edema. He was switched his Santyl last visit which the nurses are applying once during his doctor visit and once on a nurse visit. He was also reduced to 2 layer compression I'm not exactly sure of the issue here. 07/21/17; better surface today after 1 week of Iodoflex. Significant cellulitis that we treated last week also better. [Doxycycline] 07/28/17 better surface today with now 2 weeks of Iodoflex. Significant cellulitis treated with doxycycline. He has now completed the doxycycline and he is back to his usual degree of chronic venous inflammation/stasis dermatitis. He reminds me he has had ablations surgery here 08/04/17; continued improvement with Iodoflex to the left lateral leg wound in terms of the surface of the wound although the dimensions are better. 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 Problematic areas on the plantar aspect of the first second toe web space on the left and the dorsal aspect of the first second toe web space on the right. At one point I felt these were probably related to chronic fungal infections in treated him aggressively for this although we have not made any improvement here. 08/11/17; left lateral leg. Surface continues to improve with the Iodoflex although we are not seeing much improvement in overall wound dimensions. Areas on his plantar left foot and right foot show no improvement. In fact the right foot looks somewhat worse 08/18/17; left lateral leg. We changed to [facility] Blue last week after a prolonged course of Iodoflex which  helps get the surface better. It appears that the wound with is improved. Continue with difficult areas on the left dorsal first second and plantar first second on the right 09/01/17; patient arrives in clinic today having had a temperature of 103 yesterday. He was seen in the ER and [facility]. The patient was concerned he could have cellulitis again in the right leg however they diagnosed him with a UTI and he is now on Keflex. He has a history of cellulitis which is been recurrent and difficult but this is been in the left leg, in the past 5 use doxycycline. He does in and out catheterizations at home which are risk factors for UTI 09/08/17; patient will be completing his Keflex this weekend. The erythema on the left leg is considerably better. He has a new wound today on the medial part of the right leg small superficial almost looks like a skin tear. He has worsening of the area on the right dorsal first and second toe. His major area on the left lateral leg is better. Using Hydrofera Blue on all areas 09/15/17; gradual reduction in width on the long wound in the left lateral leg. No debridement required. He also has wounds on the plantar aspect of his left first second toe web space and on the dorsal aspect of the right first second toe web space. 09/22/17; there continues to be very gradual improvements in the dimensions of the left lateral leg wound. He hasn't round erythematous spot with might be pressure on his wheelchair. There is no evidence obviously of infection no purulence no warmth ooHe has a  dry scaled area on the plantar aspect of the left first second toe ooImproved area on the dorsal right first second toe. 09/29/17; left lateral leg wound continues to improve in dimensions mostly with an is still a fairly long but increasingly narrow wound. ooHe has a dry scaled area on the plantar aspect of his left first second toe web space ooIncreasingly concerning area on the dorsal  right first second toe. In fact I am concerned today about possible cellulitis around this wound. The areas extending up his second toe and although there is deformities here almost appears to abut on the nailbed. 10/06/17; left lateral leg wound continues to make very gradual progress. Tissue culture I did from the right first second toe dorsal foot last time grew MRSA and enterococcus which was vancomycin sensitive. This was not sensitive to clindamycin or doxycycline. He is allergic to Zyvox and sulfa we have therefore arrange for him to have dalvance infusion tomorrow. He is had this in the past and tolerated it well 10/20/17; left lateral leg wound continues to make decent progress. This is certainly reduced in terms of with there is advancing epithelialization.ooThe cellulitis in the right foot looks better although he still has a deep wound in the dorsal aspect of the first second toe web space. Plantar left first toe web space on the left I think is making some progress 10/27/17; left lateral leg wound continues to make decent progress. Advancing epithelialization.using Hydrofera Blue ooThe right first second toe web space wound is better-looking using silver alginate ooImprovement in the left plantar first second toe web space. Again using silver alginate 11/03/17 left lateral leg wound continues to make decent progress albeit slowly. Using Hydrofera Blue ooThe right per second toe web space continues to be a very problematic looking punched out wound. I obtained a piece of tissue for deep culture I did extensively treated this for fungus. It is difficult to imagine that this is a pressure area as the patient states other than going outside he doesn't really wear shoes at home ooThe left plantar first second toe web space looked fairly senescent. Necrotic edges. This required debridement oochange to Hydrofera Blue to all wound areas 11/10/17; left lateral leg wound continues to  contract. Using Hydrofera Blue ooOn the right dorsal first second toe web space dorsally. Culture I did of this area last week grew MRSA there is not an easy oral option in this patient was multiple antibiotic allergies or intolerances. This was only a rare culture isolate I'm therefore going to use Bactroban under silver alginate ooOn the left plantar first second toe web space. Debridement is required here. This is also unchanged 11/17/17; left lateral leg wound continues to contract using Hydrofera Blue this is no longer the major issue. ooThe major concern here is the right first second toe web space. He now has an open area going from dorsally to the plantar aspect. There is now wound on the inner lateral part of the first toe. Not a very viable surface on this. There is erythema spreading medially into the forefoot. ooNo major change in the left first second toe plantar wound 11/24/17; left lateral leg wound continues to contract using Hydrofera Blue. Nice improvement today ooThe right first second toe web space all of this looks a lot less angry than last week. I have given him clindamycin and topical Bactroban for MRSA and terbinafine for the possibility of underlining tinea pedis that I could not control with ketoconazole. Looks somewhat better [employer]  ooThe area on the plantar left first second toe web space is weeping with dried debris around the wound 12/01/17; left lateral leg wound continues to contract he Hydrofera Blue. It is becoming thinner in terms of with nevertheless it is making good improvement. ooThe right first second toe web space looks less angry but still a large necrotic-looking wounds starting on the plantar aspect of the right foot extending between the toes and now extensively on the base of the right second toe. I gave him clindamycin and topical Bactroban for MRSA anterior benefiting for the possibility of underlying tinea pedis. Not looking better today ooThe area  on the left first/second toe looks better. Debrided of necrotic debris 12/05/17* the patient was worked in urgently today because over the weekend he found blood on his incontinence bad when he woke up. He was found to have an ulcer by his wife who does most of his wound care. He came in today for [place] to look at this. He has not had a history of wounds in his buttocks in spite of his paraplegia. 12/08/17; seen in follow-up today at his usual appointment. He was seen earlier this week and found to have a new wound on his buttock. We also follow him for wounds on the left lateral leg, left first second toe web space and right first second toe web space 12/15/17; we have been using Hydrofera Blue to the left lateral leg which has improved. The right first second toe web space has also improved. Left first second toe web space plantar aspect looks stable. The left buttock has worsened using Santyl. Apparently the buttock has drainage 12/22/17; we have been using Hydrofera Blue to the left lateral leg which continues to improve now 2 small wounds separated by normal skin. He tells [place] he had a fever up to 100 yesterday he is prone to UTIs but has not noted anything different. He does in and out catheterizations. The area between the first and second toes today does not look good necrotic surface covered with what looks to be purulent drainage and erythema extending into the third toe. I had gotten this to something that I thought look better last time however it is not look good today. He also has a necrotic surface over the buttock wound which is expanded. I thought there might be infection under here so I removed a lot of the surface with a #5 curet though nothing look like it really needed culturing. He is been using Santyl to this area 12/27/17; his original wound on the left lateral leg continues to improve using Hydrofera Blue. I gave him samples of Baxdella although he was unable to take them out of  fear for an allergic reaction ["lump in his throat"].the culture I did of the purulent drainage from his second toe last week showed both enterococcus and a set Enterobacter I was also concerned about the erythema on the bottom of his foot although paradoxically although this looks somewhat better today. 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 We've been using silver alginate here. They came up today that he sleeps in a recliner, I'm not sure why but I asked him to stop this 01/03/18; his original wound we've been using Hydrofera Blue is now separated into 2 areas. ooUlcer on his left buttock is better he is off the recliner and sleeping in bed ooFinally both wound areas between his  first and second toes also looks some better 01/10/18; his original wound on the left lateral leg is now separated into 2 wounds we've been using Hydrofera Blue ooUlcer on his left buttock has some drainage. There is a small probing site going into muscle layer superiorly.using silver alginate -He arrives today with a deep tissue injury on the left heel ooThe wound on the dorsal aspect of his first second toe on the left looks a lot betterusing silver alginate ketoconazole ooThe area on the first second toe web space on the right also looks a lot bette 01/17/18; his original wound on the left lateral leg continues to progress using Hydrofera Blue ooUlcer on his left buttock also is smaller surface healthier except for a small probing site going into the muscle layer superiorly. 2.4 cm of tunneling in this area ooDTI on his left heel we have only been offloading. Looks better than last week no threatened open no evidence of infection oothe wound on the dorsal aspect of the first second toe on the left continues to look like it's regressing we have only been using silver alginate and terbinafine orally ooThe area  in the first second toe web space on the right also looks to be a lot better using silver alginate and terbinafine I think this was prompted by tinea pedis 01/31/18; the patient was hospitalized in [place] last week apparently for a complicated UTI. He was discharged on cefepime he does in and out catheterizations. In the hospital he was discovered M I don't mild elevation of AST and ALTs and the terbinafine was stopped.predictably the pressure ulcer on his buttock looks betterusing silver alginate. The area on the left lateral leg also is better using Hydrofera Blue. The area between the first and second toes on the left better. First and second toes on the right still substantial but better. Finally the DTI on the left heel has held together and looks like it's resolving 02/07/18-he is here in follow-up evaluation for multiple ulcerations. He has new injury to the lateral aspect of the last issue a pressure ulcer, he states this is from adhesive removal trauma. He states he has tried multiple adhesive products with no success. All other ulcers appear stable. The left heel DTI is resolving. We will continue with same treatment plan and follow-up next week. 02/14/18; follow-up for multiple areas. ooHe has a new area last week on the lateral aspect of his pressure ulcer more over the posterior trochanter. The original pressure ulcer looks quite stable has healthy granulation. 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 Almost fully epithelialized on the original superior area using Hydrofera Blue ooDTI on the left heel has peeled off this week to reveal a small superficial wound under denuded skin and subcutaneous tissue ooBoth areas between the first and second toes look better including nothing open on the left 02/21/18; ooThe patient's wounds on his left ischial tuberosity and posterior  left greater trochanter actually looked better. He has a large area of irritation around the area which I think is contact dermatitis. I am doubtful that this is fungal ooHis original wound on the left lateral calf continues to improve we have been using Hydrofera Blue ooThere is no open area in the left first second toe web space although there is a lot of thick callus ooThe DTI on the left heel required debridement today of necrotic surface eschar and subcutaneous  tissue using silver alginate ooFinally the area on the right first second toe webspace continues to contract using silver alginate and ketoconazole 02/28/18 ooLeft ischial tuberosity wounds look better using silver alginate. ooOriginal wound on the left calf only has one small open area left using Hydrofera Blue ooDTI on the left heel required debridement mostly removing skin from around this wound surface. Using silver alginate ooThe areas on the right first/second toe web space using silver alginate and ketoconazole 03/08/18 on evaluation today patient appears to be doing decently well as best I can tell in regard to his wounds. This is the first time that I have seen him as he generally is followed by Dr. Dellia Nims. With that being said none of his wounds appear to be infected he does have an area where there is some skin covering what appears to be a new wound on the left dorsal surface of his great toe. This is right at the nail bed. With that being said I do believe that debrided away some of the excess skin can be of benefit in this regard. Otherwise he has been tolerating the dressing changes without complication. 03/14/18; patient arrives today with the multiplicity of wounds that we are following. He has not been systemically unwell ooOriginal wound on the left lateral calf now only has 2 small open areas we've been using Hydrofera Blue which should continue ooThe deep tissue injury on the left heel requires  debridement today. We've been using silver alginate ooThe left first second toe and the right first second toe are both are reminiscence what I think was tinea pedis. Apparently some of the callus Surface between the toes was removed last week when it started draining. ooPurulent drainage coming from the wound on the ischial tuberosity on the left. 03/21/18-He is here in follow-up evaluation for multiple wounds. 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 He tolerated debridement. The only change to last week's recommendations is to discontinue antifungal cream between toes. He will follow-up next week 03/28/18; following up for multiple wounds;Concern this week is streaking redness and swelling in the right foot. He is going to need antibiotics for this. 03/31/18; follow-up for right foot cellulitis. Streaking redness and swelling in the right foot on 03/28/18. 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 I put him on clindamycin 300 mg every 6 and brought him in for a quick check. He has an open wound between his first and second toes on the right foot as a potential source. 04/04/18; ooRight foot cellulitis is resolving he is completing clindamycin. This is truly good news ooLeft lateral calf wound which is initial wound only has one small open area inferiorly this is close to healing out. He has compression stockings. We will use Hydrofera Blue right down to the epithelialization of this ooNonviable surface on the left heel which was initially pressure with a DTI. We've been using Hydrofera Blue. I'm going to switch this back to silver alginate ooLeft first second toe/tinea pedis this looks better using silver alginate ooRight first second toe tinea pedis using silver alginate ooLarge pressure ulcers on theLeft ischial tuberosity. Small wound here Looks better. I am uncertain about the surface over  the large wound. Using silver alginate 04/11/18; ooCellulitis in the right foot is resolved ooLeft lateral calf wound which was his original wounds still has 2 tiny open areas remaining this is just about closed ooNonviable surface on the left heel  is better but still requires debridement ooLeft first second toe/tinea pedis still open using silver alginate ooRight first second toe wound tinea pedis I asked him to go back to using ketoconazole and silver alginate ooLarge pressure ulcers on the left ischial tuberosity this shear injury here is resolved. Wound is smaller. No evidence of infection using silver alginate 04/18/18; ooPatient arrives with an intense area of cellulitis in the right mid lower calf extending into the right heel area. Bright red and warm. Smaller area on the left anterior leg. He has a significant history of MRSA. He will definitely need antibioticsoodoxycycline ooHe now has 2 open areas on the left ischial tuberosity the original large wound and now a satellite area which I think was above his initial satellite areas. Not a wonderful surface on this satellite area surrounding erythema which looks like pressure related. ooHis left lateral calf wound again his original wound is just about closed ooLeft heel pressure injury still requiring debridement ooLeft first second toe looks a lot better using silver alginate ooRight first second toe also using silver alginate and ketoconazole cream also looks better 04/20/18; the patient was worked in early today out of concerns with his cellulitis on the right leg. I had started him on doxycycline. This was 2 days ago. His wife was concerned about the swelling in the area. Also concerned about the left buttock. He has not been systemically unwell no fever chills. No nausea vomiting or diarrhea 04/25/18; the patient's left buttock wound is continued to deteriorate he is using Hydrofera Blue. He is still  completing clindamycin for the cellulitis on the right leg although all of this looks better. 05/02/18 ooLeft buttock wound still with a lot of drainage and a very tightly adherent fibrinous necrotic surface. He has a deeper area superiorly ooThe left lateral calf wound is still closed ooDTI wound on the left heel necrotic surface especially the circumference using Iodoflex ooAreas between his left first second toe and right first second toe both look better. Dorsally and the right first second toe he had a necrotic surface although at smaller. In using silver alginate and ketoconazole. I did a culture last week which was a deep tissue culture of the reminiscence of the open wound on the right first second toe dorsally. This grew a few Acinetobacter and a few methicillin-resistant staph aureus. Nevertheless the area actually this week looked better. I didn't feel the need to specifically address this at least in terms of systemic antibiotics. 05/09/18; wounds are measuring larger more drainage per our intake. We are using Santyl covered with alginate on the large superficial buttock wounds, Iodosorb on the left heel, ketoconazole and silver alginate to the dorsal first and second toes bilaterally. 05/16/18; ooThe area on his left buttock better in some aspects although the area superiorly over the ischial tuberosity required an extensive debridement.using Santyl ooLeft heel appears stable. Using Iodoflex ooThe areas between his first and second toes are not bad however there is spreading erythema up the dorsal aspect of his left foot this looks like cellulitis again. He is insensate the erythema is really very brilliant.o Erysipelas He went to see an allergist days ago because he was itching part of this he had lab work done. This showed a white count of 15.1 with 70% neutrophils. Hemoglobin of 11.4 and a platelet count of 659,000. Last white count we had in Epic was a 2-1/2 years ago  which was 25.9 but he was ill at the time. He was  able to show me some lab work that was done by his primary physician the pattern is about the same. 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 But prompted me to go ahead and do x-rays of both feet and the pelvis rule out osteomyelitis. He also had a comprehensive metabolic panel this was reasonably normal his albumin was 3.7 liver function tests BUN/creatinine all normal 05/23/18; x-rays of both his feet from last week were negative for underlying pulmonary abnormality. The x-ray of his pelvis however showed mild irregularity in the left ischial which may represent some early osteomyelitis. The wound in the left ischial continues to get deeper clearly now exposed muscle. Each week necrotic surface material over this area. Whereas the rest of the wounds do not look so bad. ooThe left ischial wound we have been using Santyl and calcium alginate ooTo the left heel surface necrotic debris using Iodoflex ooThe left lateral leg is still healed ooAreas on the left dorsal foot and the right dorsal foot are about the same. There is some inflammation on the left which might represent contact dermatitis, fungal dermatitis I am doubtful cellulitis although this looks better than last week 05/30/18; CT scan done at Hospital did not show any osteomyelitis or abscess. Suggested the possibility of underlying cellulitis although I don't see a lot of evidence of this at the bedside ooThe wound itself on the left buttock/upper thigh actually looks somewhat better. No debridement ooLeft heel also looks better no debridement continue Iodoflex ooBoth dorsal first second toe spaces appear better using Lotrisone. Left still required debridement 06/06/18; ooIntake reported some purulent looking drainage from the left gluteal wound. Using Santyl and calcium alginate ooLeft heel looks better although still a nonviable  surface requiring debridement ooThe left dorsal foot first/second webspace actually expanding and somewhat deeper. I may consider doing a shave biopsy of this area ooRight dorsal foot first/second webspace appears stable to improved. Using Lotrisone and silver alginate to both these areas 06/13/18 ooLeft gluteal surface looks better. Now separated in the 2 wounds. No debridement required. Still drainage. We'll continue silver alginate ooLeft heel continues to look better with Iodoflex continue this for at least another week ooOf his dorsal foot wounds the area on the left still has some depth although it looks better than last week. We've been using Lotrisone and silver alginate 06/20/18 ooLeft gluteal continues to look better healthy tissue ooLeft heel continues to look better healthy granulation wound is smaller. He is using Iodoflex and his long as this continues continue the Iodoflex ooDorsal right foot looks better unfortunately dorsal left foot does not. There is swelling and erythema of his forefoot. He had minor trauma to this several days ago but doesn't think this was enough to have caused any tissue injury. Foot looks like cellulitis, we have had this problem before 06/27/18 on evaluation today patient appears to be doing a little worse in regard to his foot ulcer. 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 Both of which would really be his only options for treating this infection. In the past he has been given and effusion of Orbactiv. This is done very well for him in the past again it's one time dosing IV antibiotic therapy. Subsequently I do believe this is something we're gonna need to see about doing at  this point in time. Currently his other wounds seem to be doing somewhat better in my pinion I'm pretty happy in that  regard. 07/03/18 on evaluation today patient's wounds actually appear to be doing fairly well. He has been tolerating the dressing changes without complication. All in all he seems to be showing signs of improvement. In regard to the antibiotics he has been dealing with infectious disease since I saw him last week as far as getting this scheduled. In the end [facility]'s [facility] this coming Friday. In the meantime he has been continuing to perform the dressing changes in such as previous. There does not appear to be any evidence of infection worsengin at this time. 07/10/18; ooSince I last saw this man 2 weeks ago things have actually improved. IV antibiotics of resulted in less forefoot erythema although there is still some present. He is not systemically unwell ooLeft buttock wounds o2 now have no depth there is increased epithelialization Using silver alginate ooLeft heel still requires debridement using Iodoflex ooLeft dorsal foot still with a sizable wound about the size of a border but healthy granulation ooRight dorsal foot still with a slitlike area using silver alginate 07/18/18; the patient's cellulitis in the left foot is improved in fact I think it is on its way to resolving. ooLeft buttock wounds o2 both look better although the larger one has hypertension granulation we've been using silver alginate ooLeft heel has some thick circumferential redundant skin over the wound edge which will need to be removed today we've been using Iodoflex ooLeft dorsal foot is still a sizable wound required debridement using silver alginate ooThe right dorsal foot is just about closed only a small open area remains here 07/25/18; left foot cellulitis is resolved ooLeft buttock wounds o2 both look better. Hyper-granulation on the major area ooLeft heel as some debris over the surface but otherwise looks a healthier wound. Using silver  collagen ooRight dorsal foot is just about closed 07/31/18; arrives with our intake nurse worried about purulent drainage from the buttock. We had hyper-granulation here last week ooHis buttock wounds o2 continue to look better ooLeft heel some debris over the surface but measuring smaller. ooRight dorsal foot unfortunately has openings between the toes ooLeft foot superficial wound looks less aggravated. 08/07/18 ooButtock wounds continue to look better although some of her granulation and the larger medial wound. silver alginate ooLeft heel continues to look a lot better.silver collagen ooLeft foot superficial wound looks less stable. Requires debridement. He has a new wound superficial area on the foot on the lateral dorsal foot. ooRight foot looks better using silver alginate without Lotrisone 08/14/2018; patient was in the ER last week diagnosed with a UTI. He is now on Cefpodoxime and Macrodantin. ooButtock wounds continued to be smaller. Using silver alginate ooLeft heel continues to look better using silver collagen ooLeft foot superficial wound looks as though it is improving ooRight dorsal foot area is just about healed. 08/21/2018; patient is completed his antibiotics for his UTI. ooHe has 2 open areas on the buttocks. There is still not closed although the surface looks satisfactory. Using silver alginate ooLeft heel continues to improve using silver collagen ooThe bilateral dorsal foot areas which are at the base of his first and second toes/possible tinea pedis are actually stable on the left but worse on the right. The area on the left required debridement of necrotic surface. After debridement I obtained a  specimen for PCR culture. ooThe right dorsal foot which is been just about healed last week is now reopened 08/28/2018; culture done on the left dorsal foot showed coag negative staph both staph epidermidis and Lugdunensis. I think this is worthwhile  initiating systemic treatment. I will use doxycycline given his long list of allergies. The area on the left heel slightly improved but still requiring debridement. ooThe large wound on the buttock is just about closed whereas the smaller one is larger. Using silver alginate in this area 09/04/2018; patient is completing his doxycycline for the left foot although this continues to be a very difficult wound area with very adherent necrotic debris. We are using silver alginate to all his wounds right foot left foot and the small wounds on his buttock, silver collagen on the left heel. 09/11/2018; once again this patient has intense erythema and swelling of the left forefoot. Lesser degrees of erythema in the right foot. He has a long list of allergies and intolerances. I will reinstitute doxycycline. 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 Using silver alginate ooLeft heel also looks better using silver collagen ooUnfortunately both the areas on his feet look worse. The area on the left first second webspace is now gone through to the plantar part of his foot. The area on the left foot anteriorly is irritated with erythema and swelling in the forefoot. 09/25/2018 ooHis wound on the left plantar heel looks better. Using silver collagen ooThe area on the left buttock 2 small remnant areas. One is closed one is still open. Using silver alginate ooThe areas between both his first and second toes look worse. This in spite of long-standing antifungal therapy with ketoconazole and silver alginate which should have antifungal activity ooHe has small areas around his original wound on the left calf one is on the bottom of the original scar tissue and one superiorly both of these are small and superficial but again given wound history in this site this is worrisome 10/02/2018 ooLeft plantar heel continues to gradually contract using silver  collagen ooLeft buttock wound is unchanged using silver alginate ooThe areas on his dorsal feet between his first and second toes bilaterally look about the same. 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 Small open area here. We have been using silver alginate 10/09/2018; ooLeft plantar heel continues to look healthy and contract. No debridement is required ooLeft buttock slightly smaller with a tape injury wound just below which was new this week ooDorsal feet somewhat improved I have been using clindamycin ooLeft lateral looks lower extremity the actual open area looks worse although a lot of this is epithelialized. I am going to change to silver collagen today He has a lot more swelling in the right leg although this is not pitting not red and not particularly warm there is a lot of spasm in the right leg usually indicative of people with paralysis of some underlying discomfort. We have reviewed his vascular status from 2017 he had a left greater saphenous vein ablation. I wonder about referring him back to vascular surgery if the area on the left leg continues to deteriorate. 10/16/2018 in today for follow-up and management of multiple lower extremity ulcers. His left Buttock wound is much lower smaller and almost closed completely.  The wound to the left ankle has began to reopen with Epithelialization and some adherent slough. He has multiple new areas to the left foot and leg. The left dorsal foot without much improvement. Wound present between left great webspace and 2nd toe. Erythema and edema present right leg. Right LE ultrasound obtained on 10/10/18 was negative for DVT. 10/23/2018; ooLeft buttock is closed over. Still dry macerated skin but there is no open wound. I suspect this is  chronic pressure/moisture ooLeft lateral calf is quite a bit worse than when I saw this last. There is clearly drainage here he has macerated skin into the left plantar heel. We will change the primary dressing to alginate ooLeft dorsal foot has some improvement in overall wound area. Still using clindamycin and silver alginate ooRight dorsal foot about the same as the left using clindamycin and silver alginate ooThe erythema in the right leg has resolved. He is DVT rule out was negative ooLeft heel pressure area required debridement although the wound is smaller and the surface is health 10/26/2018 ooThe patient came back in for his nurse check today predominantly because of the drainage coming out of the left lateral leg with a recent reopening of his original wound on the left lateral calf. He comes in today with a large amount of surrounding erythema around the wound extending from the calf into the ankle and even in the area on the dorsal foot. He is not systemically unwell. He is not febrile. Nevertheless this looks like cellulitis. We have been using silver alginate to the area. I changed him to a regular visit and I am going to prescribe him doxycycline. The rationale here is a long list of medication intolerances and a history of MRSA. I did not see anything that I thought would provide a valuable culture 10/30/2018 ooFollow-up from his appointment 4 days ago with really an extensive area of cellulitis in the left calf left lateral ankle and left dorsal foot. I put him on doxycycline. He has a long list of medication allergies which are true allergy reactions. 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 In any case he is a lot better today. The erythema especially in the anterior and lateral left calf is better. He still has left ankle erythema. He also is complaining about increasing edema in the right leg we  have only been using Kerlix Coban and he has been doing the wraps at home. Finally he has a spotty rash on the medial part of his upper left calf which looks like folliculitis or perhaps wrap occlusion type injury. Small superficial macules not pustules 11/06/18 patient arrives today with again a considerable degree of erythema around the wound on the left lateral calf extending into the dorsal ankle and dorsal foot. This is a lot worse than when I saw this last week. He is on doxycycline really with not a lot of improvement. He has not been systemically unwell Wounds on the; left heel actually looks improved. Original area on the left foot and proximity to the first and second toes looks about the same. He has superficial areas on the dorsal foot, anterior calf and then the reopening of his original wound on the left lateral calf which looks about the same ooThe only area he has on the right is the dorsal webspace first and second which is smaller. ooHe has a large area of dry erythematous skin on the left buttock small  open area here. 11/13/2018; the patient arrives in much better condition. The erythema around the wound on the left lateral calf is a lot better. Not sure whether this was the clindamycin or the TCA and ketoconazole or just in the improvement in edema control [stasis dermatitis]. In any case this is a lot better. The area on the left heel is very small and just about resolved using silver collagen we have been using silver alginate to the areas on his dorsal feet 11/20/2018; his wounds include the left lateral calf, left heel, dorsal aspects of both feet just proximal to the first second webspace. He is stable to slightly improved. I did not think any changes to his dressings were going to be necessary 11/27/2018 he has a reopening on the left buttock which is surrounded by what looks like tinea or perhaps some other form of dermatitis. The area on the left dorsal foot has some  erythema around it I have marked this area but I am not sure whether this is cellulitis or not. Left heel is not closed. Left calf the reopening is really slightly longer and probably worse 1/13; in general things look better and smaller except for the left dorsal foot. Area on the left heel is just about closed, left buttock looks better only a small wound remains in the skin looks better [using Lotrisone] 1/20; the area on the left heel only has a few remaining open areas here. Left lateral calf about the same in terms of size, left dorsal foot slightly larger right lateral foot still not closed. The area on the left buttock has no open wound and the surrounding skin looks a lot better 1/27; the area on the left heel is closed. Left lateral calf better but still requiring extensive debridements. The area on his left buttock is closed. He still has the open areas on the left dorsal foot which is slightly smaller in the right foot which is slightly expanded. We have been using Iodoflex on these areas as well 2/3; left heel is closed. Left lateral calf still requiring debridement using Iodoflex there is no open area on his left buttock however he has dry scaly skin over a large area of this. Not really responding well to the Lotrisone. Finally the areas on his dorsal feet at the level of the first second webspace are slightly smaller on the right and about the same on the left. Both of these vigorously debrided with Anasept and gauze 2/10; left heel remains closed he has dry erythematous skin over the left buttock but there is no open wound here. Left lateral leg has come in and with. Still requiring debridement we have been using Iodoflex here. Finally the area on the left dorsal foot and right dorsal foot are really about the same extremely dry callused fissured areas. He does not yet have a dermatology appointment 2/17; left heel remains closed. He has a new open area on the left buttock. The  area on the left lateral calf is bigger longer and still covered in necrotic debris. No major change in his foot areas bilaterally. I am awaiting for a dermatologist to look on this. We have been using ketoconazole I do not know that this is been doing any good at all. 2/24; left heel remains closed. The left buttock wound that was new reopening last week looks better. The left lateral calf appears better also although still requires debridement. The major area on his foot is the left first second  also requiring debridement. We have been putting Prisma on all wounds. I do not believe that the ketoconazole has done too much good for his feet. He will use Lotrisone I am going to give him a 2-week course of terbinafine. We still do not have a dermatology appointment 3/2 left heel remains closed however there is skin over bone in this area I pointed this out to him today. The left buttock wound is epithelialized but still does not look completely stable. The area on the left leg required debridement were using silver collagen here. With regards to his feet we changed to Lotrisone last week and silver alginate. 3/9; left heel remains closed. Left buttock remains closed. The area on the right foot is essentially closed. The left foot remains unchanged. Slightly smaller on the left lateral calf. Using silver collagen to both of these areas 3/16-Left heel remains closed. Area on right foot is closed. Left lateral calf above the lateral malleolus open wound requiring debridement with easy bleeding. Left dorsal wound proximal to first toe also debrided. Left ischial area open new. Patient has been using Prisma with wrapping every 3 days. Dermatology appointment is apparently tomorrow.Patient has completed his terbinafine 2-week course with some apparent improvement according to him, there is still flaking and dry skin in his foot on the left 3/23; area on the right foot is reopened. The area on the left  anterior foot is about the same still a very necrotic adherent surface. He still has the area on the left leg and reopening is on the left buttock. He apparently saw dermatology although I do not have a note. According to the patient who is usually fairly well informed they did not have any good ideas. Put him on oral terbinafine which she is been on before. 3/30; using silver collagen to all wounds. Apparently his dermatologist put him on doxycycline and rifampin presumably some culture grew staph. I do not have this result. He remains on terbinafine although I have used terbinafine on him before 4/6; patient has had a fairly substantial reopening on the right foot between the first and second toes. He is finished his terbinafine and I believe is on doxycycline and rifampin still as prescribed by dermatology. We have been using silver collagen to all his wounds although the patient reports that he thinks silver alginate does better on the wounds on his buttock. 4/13; the area on his left lateral calf about the same size but it did not require debridement. ooLeft dorsal foot just proximal to the webspace between the first and second toes is about the same. Still nonviable surface. I note some superficial bronze discoloration of the dorsal part of his foot ooRight dorsal foot just proximal to the first and second toes also looks about the same. I still think there may be the same discoloration I noted above on the left ooLeft buttock wound looks about the same 4/20; left lateral calf appears to be gradually contracting using silver collagen. 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 The left dorsal foot wound is debrided of tightly adherent necrotic debris and really cleans up quite nicely. The right area is worse with expansion. I did not debride this it is now over the base of the second toe ooThe area on his left buttock is  smaller no debridement is required using silver collagen 5/4; left calf continues to make good progress. ooHe arrives with erythema around the wounds on his dorsal foot  which even extends to the plantar aspect. Very concerning for coexistent infection. He is finished the erythromycin I gave him for possible erythrasma this does not seem to have helped. ooThe area on the left foot is about the same base of the dorsal toes ooIs area on the buttock looks improved on the left 5/11; left calf and left buttock continued to make good progress. Left foot is about the same to slightly improved. ooMajor problem is on the right foot. He has not had an x-ray. Deep tissue culture I did last week showed both Enterobacter and E. coli. I did not change the doxycycline I put him on empirically although neither 1 of these were plated to doxycycline. He arrives today with the erythema looking worse on both the dorsal and plantar foot. Macerated skin on the bottom of the foot. he has not been systemically unwell 5/18-Patient returns at 1 week, left calf wound appears to be making some progress, left buttock wound appears slightly worse than last time, left foot wound looks slightly better, right foot redness is marginally better. X-ray of both feet show no air or evidence of osteomyelitis. Patient is finished his Omnicef and terbinafine. He continues to have macerated skin on the bottom of the left foot as well as right 5/26; left calf wound is better, left buttock wound appears to have multiple small superficial open areas with surrounding macerated skin. X-rays that I did last time showed no evidence of osteomyelitis in either foot. He is finished cefdinir and doxycycline. I do not think that he was on terbinafine. He continues to have a large superficial open area on the right foot anterior dorsal and slightly between the first and second toes. I did send him to dermatology 2 months ago or so wondering about  whether they would do a fungal scraping. I do not believe they did but did do a culture. We have been using silver alginate to the toe areas, he has been using antifungals at home topically either ketoconazole or Lotrisone. We are using silver collagen on the left foot, silver alginate on the right, silver collagen on the left lateral leg and silver alginate on the left buttock 6/1; left buttock area is healed. We have the left dorsal foot, left lateral leg and right dorsal foot. We are using silver alginate to the areas on both feet and silver collagen to the area on his left lateral calf 6/8; the left buttock apparently reopened late last week. He is not really sure how this happened. He is tolerating the terbinafine. Using silver alginate to all wounds 6/15; left buttock wound is larger than last week but still superficial. ooCame in the clinic today with a report of purulence from the left lateral leg I did not identify any infection ooBoth areas on his dorsal feet appear to be better. He is tolerating the terbinafine. Using silver alginate to all wounds 6/22; left buttock is about the same this week, left calf quite a bit better. His left foot is about the same however he comes in with erythema and warmth in the right forefoot once again. Culture that I gave him in the beginning of May showed Enterobacter and E. coli. I gave him doxycycline and things seem to improve although neither 1 of these organisms was specifically plated. 6/29; left buttock is larger and dry this week. Left lateral calf looks to me to be improved. Left dorsal foot also somewhat improved right foot completely unchanged. The erythema on the right  foot is still present. He is completing the Ceftin dinner that I gave him empirically [see discussion above.) 7/6 - All wounds look to be stable and perhaps improved, the left buttock wound is slightly smaller, per patient bleeds easily, completed ceftin, the right foot  redness is less, he is on terbinafine 7/13; left buttock wound about the same perhaps slightly narrower. Area on the left lateral leg continues to narrow. Left dorsal foot slightly smaller right foot about the same. We are using silver alginate on the right foot and Hydrofera Blue to the areas on the left. Unna boot on the left 2 layer compression on the right 7/20; left buttock wound absolutely the same. Area on lateral leg continues to get better. Left dorsal foot require debridement as did the right no major change in the 7/27; left buttock wound the same size necrotic debris over the surface. The area on the lateral leg is closed once again. His left foot looks better right foot about the same although there is some involvement now of the posterior first second toe area. He is still on terbinafine which I have given him for a month, not certain a centimeter major change 06/25/19-All wounds appear to be slightly improved according to report, left buttock wound looks clean, both foot wounds have minimal to no debris the right dorsal foot has minimal slough. We are using Hydrofera Blue to the left and silver alginate to the right foot and ischial wound. 8/10-Wounds all appear to be around the same, the right forefoot distal part has some redness which was not there before, however the wound looks clean and small. Ischial wound looks about the same with no changes 8/17; his wound on the left lateral calf which was his original chronic venous insufficiency wound remains closed. Since I last saw him the areas on the left dorsal foot right dorsal foot generally appear better but require debridement. The area on his left initial tuberosity appears somewhat larger to me perhaps hyper granulated and bleeds very easily. We have been using Hydrofera Blue to the left dorsal foot and silver alginate to everything else 8/24; left lateral calf remains closed. The areas on his dorsal feet on the webspace of the  first and second toes bilaterally both look better. The area on the left buttock which is the pressure ulcer stage II slightly smaller. I change the dressing to Hydrofera Blue to all areas 8/31; left lateral calf remains closed. The area on his dorsal feet bilaterally look better. Using Hydrofera Blue. Still requiring debridement on the left foot. No change in the left buttock pressure ulcers however 9/14; left lateral calf remains closed. Dorsal feet look quite a bit better than 2 weeks ago. Flaking dry skin also a lot better with the ammonium lactate I gave him 2 weeks ago. The area on the left buttock is improved. He states that his Roho cushion developed a leak and he is getting a new one, in the interim he is offloading this vigorously 9/21; left calf remains closed. Left heel which was a possible DTI looks better this week. He had macerated tissue around the left dorsal foot right foot looks satisfactory and improved left buttock wound. I changed his dressings to his feet to silver alginate bilaterally. Continuing Hydrofera Blue on the left buttock. 9/28 left calf remains closed. Left heel did not develop anything [possible DTI] dry flaking skin on the left dorsal foot. Right foot looks satisfactory. Improved left buttock wound. We are using silver  alginate on his feet Hydrofera Blue on the buttock. I have asked him to go back to the Lotrisone on his feet including the wounds and surrounding areas 10/5; left calf remains closed. The areas on the left and right feet about the same. A lot of this is epithelialized however debris over the remaining open areas. He is using Lotrisone and silver alginate. The area on the left buttock using Hydrofera Blue 10/26. Patient has been out for 3 weeks secondary to Covid concerns. He tested negative but I think his wife tested positive. He comes in today with the left foot substantially worse, right foot about the same. Even more concerning he states that  the area on his left buttock closed over but then reopened and is considerably deeper in one aspect than it was before [stage III wound] 11/2; left foot really about the same as last week. Quarter sized wound on the dorsal foot just proximal to the first second toes. Surrounding erythema with areas of denuded epithelium. This is not really much different looking. Did not look like cellulitis this time however. ooRight foot area about the same.. We have been using silver alginate alginate on his toes ooLeft buttock still substantial irritated skin around the wound which I think looks somewhat better. We have been using Hydrofera Blue here. 11/9; left foot larger than last week and a very necrotic surface. Right foot I think is about the same perhaps slightly smaller. Debris around the circumference also addressed. Unfortunately on the left buttock there is been a decline. Satellite lesions below the major wound distally and now a an additional one posteriorly we have been using Hydrofera Blue but I think this is a pressure issue 11/16; left foot ulcer dorsally again a very adherent necrotic surface. Right foot is about the same. Not much change in the pressure ulcer on his left buttock. 11/30; left foot ulcer dorsally basically the same as when I saw him 2 weeks ago. Very adherent fibrinous debris on the wound surface. Patient reports a lot of drainage as well. 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 We have been using Iodoflex, patient changed back to alginate because of the drainage. Area on his right dorsal foot really looks benign with a healthier surface certainly a lot better than on the left. Left buttock wounds all improved using Hydrofera Blue 12/7; left dorsal foot again no improvement. Tightly adherent debris. PCR culture I did last week only showed likely skin contaminant. I have gone ahead and done a punch biopsy of this which is  about the last thing in terms of investigations I can think to do. He has known venous insufficiency and venous hypertension and this could be the issue here. The area on the right foot is about the same left buttock slightly worse according to our intake nurse secondary to [facility] Blue sticking to the wound 12/14; biopsy of the left foot that I did last time showed changes that could be related to wound healing/chronic stasis dermatitis phenomenon no neoplasm. We have been using silver alginate to both feet. I change the one on the left today to Sorbact and silver alginate to his other 2 wounds 12/28; the patient arrives with the following problems; ooMajor issue is the dorsal left foot which continues to be a larger deeper wound area. Still with a completely nonviable surface ooParadoxically the area mirror image on the right on the right dorsal foot appears to be getting better.   ooHe had some loss of dry denuded skin from the lower part of his original wound on the left lateral calf. Some of this area looked a little vulnerable and for this reason we put him in wrap that on this side this week ooThe area on his left buttock is larger. He still has the erythematous circular area which I think is a combination of pressure, sweat. This does not look like cellulitis or fungal dermatitis 11/26/2019; -Dorsal left foot large open wound with depth. Still debris over the surface. Using Sorbact ooThe area on the dorsal right foot paradoxically has closed over [facility] has a reopening on the left ankle laterally at the base of his original wound that extended up into the calf. This appears clean. ooThe left buttock wound is smaller but with very adherent necrotic debris over the surface. We have been using silver alginate here as well The patient had arterial studies done in 2017. He had biphasic waveforms at the dorsalis pedis and posterior tibial bilaterally. ABI in the left was 1.17. Digit  waveforms were dampened. He has slight spasticity in the great toes I do not think a TBI would be possible 1/11; the patient comes in today with a sizable reopening between the first and second toes on the right. This is not exactly in the same location where we have been treating wounds previously. According to our intake nurse this was actually fairly deep but 0.6 cm. The area on the left dorsal foot looks about the same the surface is somewhat cleaner using Sorbact, his MRI is in 2 days. We have not managed yet to get arterial studies. The new reopening on the left lateral calf looks somewhat better using alginate. The left buttock wound is about the same using alginate 1/18; the patient had his ARTERIAL studies which were quite normal. ABI in the right at 1.13 with triphasic/biphasic waveforms on the left ABI 1.06 again with triphasic/biphasic waveforms. It would not have been possible to have done a toe brachial index because of spasticity. We have been using Sorbac to the left foot alginate to the rest of his wounds on the right foot left lateral calf and left buttock 1/25; arrives in clinic with erythema and swelling of the left forefoot worse over the first MTP area. This extends laterally dorsally and but also posteriorly. Still has an area on the left lateral part of the lower part of his calf wound it is eschared and clearly not closed. ooArea on the left buttock still with surrounding irritation and erythema. ooRight foot surface wound dorsally. The area between the right and first and second toes appears better. 2/1; ooThe left foot wound is about the same. Erythema slightly better I gave him a week of doxycycline empirically ooRight foot wound is more extensive extending between the toes to the plantar surface ooLeft lateral calf really no open surface on the inferior part of his original wound however the entire area still looks vulnerable ooAbsolutely no improvement in the  left buttock wound required debridement. 2/8; the left foot is about the same. Erythema is slightly improved I gave him clindamycin last week. ooRight foot looks better he is using Lotrimin and silver alginate ooHe has a breakdown in the left lateral calf. Denuded epithelium which I have removed ooLeft buttock about the same were using Hydrofera Blue 2/15; left foot is about the same there is less surrounding erythema. Surface still has tightly adherent debris which I have debriding however not making any progress   ooRight foot has a substantial wound on the medial right second toe between the first and second webspace. ooStill an open area on the left lateral calf distal area. ooButtock wound is about the same 2/22; left foot is about the same less surrounding erythema. Surface has adherent debris. Polymen Ag Right foot area significant wound between the first and second toes. We have been using silver alginate here Left lateral leg polymen Ag at the base of his original venous insufficiency wound ooLeft buttock some improvement here 3/1; ooRight foot is deteriorating in the first second toe webspace. Larger and more substantial. We have been using silver alginate. ooLeft dorsal foot about the same markedly adherent surface debris using PolyMem Ag ooLeft lateral calf surface debris using PolyMem AG ooLeft buttock is improved again using PolyMem Ag. ooHe is completing his terbinafine. The erythema in the foot seems better. He has been on this for 2 weeks 3/8; no improvement in any wound area in fact he has a small open area on the dorsal midfoot which is new this week. He has not gotten his foot x-rays yet Objective Constitutional Vitals Time Taken: 8:00 AM, Height: 70 in, Weight: 216 lbs, BMI: 31, Temperature: 98.2 F, Pulse: 109 bpm, Respiratory Rate: 18 breaths/min, Blood Pressure: 115/68 mmHg. Integumentary (Hair, Skin) Wound #24 status is Open. Original cause of wound was  Gradually Appeared. The wound is located on the Left,Dorsal Foot. The wound measures 3.2cm length x 2.7cm width x 0.4cm depth; 6.786cm^2 area and 2.714cm^3 volume. There is Fat Layer (Subcutaneous Tissue) Exposed exposed. There is no tunneling or undermining noted. There is a medium amount of serosanguineous drainage noted. The wound margin is distinct with the outline attached to the wound base. There is small (1-33%) red granulation within the wound bed. There is a large (67- 100%) amount of necrotic tissue within the wound bed including Adherent Slough. Wound #35 status is Open. Original cause of wound was Gradually Appeared. The wound is located on the Left Ischium. The wound measures 3.5cm length x 0.6cm width x 0.1cm depth; 1.649cm^2 area and 0.165cm^3 volume. There is Fat Layer (Subcutaneous Tissue) Exposed exposed. There is no tunneling or undermining noted. There is a medium amount of serosanguineous drainage noted. The wound margin is flat and intact. There is large (67-100%) red, friable granulation within the wound bed. There is no necrotic tissue within the wound bed. Wound #37 status is Open. Original cause of wound was Gradually Appeared. The wound is located on the Left,Lateral Malleolus. The wound measures 3.2cm length x 1.5cm width x 0.1cm depth; 3.77cm^2 area and 0.377cm^3 volume. There is Fat Layer (Subcutaneous Tissue) Exposed exposed. There is no tunneling or undermining noted. There is a medium amount of serosanguineous drainage noted. The wound margin is flat and intact. There is small (1-33%) granulation within the wound bed. There is a large (67-100%) amount of necrotic tissue within the wound bed including Adherent Slough. Wound #38 status is Open. Original cause of wound was Gradually Appeared. The wound is located on the Right Toe - Web between 1st and 2nd. The wound measures 3cm length x 2cm width x 0.2cm depth; 4.712cm^2 area and 0.942cm^3 volume. There is Fat Layer  (Subcutaneous Tissue) Exposed exposed. There is no tunneling or undermining noted. There is a medium amount of serosanguineous drainage noted. The wound margin is flat and intact. There is small (1-33%) red granulation within the wound bed. There is a large (67-100%) amount of necrotic tissue within the wound bed including  Adherent Slough. Wound #39 status is Open. Original cause of wound was Gradually Appeared. The wound is located on the Left,Proximal,Dorsal Foot. The wound measures 1.4cm length x 1.4cm width x 0.1cm depth; 1.539cm^2 area and 0.154cm^3 volume. There is Fat Layer (Subcutaneous Tissue) Exposed exposed. There is no tunneling or undermining noted. There is a medium amount of serosanguineous drainage noted. The wound margin is flat and intact. There is large (67-100%) red, pink granulation within the wound bed. 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 There was a Excisional Skin/Subcutaneous Tissue Debridement with a total area of 8.64 sq cm performed by Ricard Dillon., MD. With the following instrument(s): Curette to remove Viable and Non-Viable tissue/material. Material removed includes Subcutaneous Tissue and Slough and. No specimens were taken. A time out was conducted at 08:24, prior to the start of the procedure. A Moderate amount of bleeding was controlled with Pressure. The procedure was tolerated well with a pain level of 0 throughout and a pain level of 0 following the procedure. Post Debridement Measurements: 3.2cm length x 2.7cm width x  0.4cm depth; 2.714cm^3 volume. Character of Wound/Ulcer Post Debridement requires further debridement. 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 There was a Four Layer Compression Therapy Procedure by Levan Hurst, RN. Post procedure Diagnosis Wound #37: Same as Pre-Procedure Plan Follow-up Appointments: Return Appointment in 1 week. Dressing Change Frequency: Wound #24 Left,Dorsal Foot: Do not change entire dressing for one week. Wound #35 Left Ischium: Change Dressing every other day. Wound #37 Left,Lateral Malleolus: Do not change entire dressing for one week. Wound #38 Right Toe - Web between 1st and 2nd: Change Dressing every other day. Skin Barriers/Peri-Wound Care: Antifungal cream - on toes on both feet daily Moisturizing lotion - both legs Other: - Triamcinolone cream Wound #24 Left,Dorsal Foot: Barrier cream Moisturizing lotion - to both legs and feet Primary Wound Dressing: Wound #24 Left,Dorsal Foot: Iodoflex Wound #35 Left Ischium: Polymem Silver Wound #37 Left,Lateral Malleolus: Polymem Silver Wound #39 Left,Proximal,Dorsal Foot: Polymem Silver Wound #38 Right Toe - Web between 1st and 2nd: Calcium Alginate with Silver Secondary Dressing: Wound #24 Left,Dorsal Foot: Dry Gauze Heel Cup - add heel cup to left heel for protection. Wound #39 Left,Proximal,Dorsal Foot: Dry Gauze Heel Cup - add heel cup to left heel for protection. Wound #35 Left Ischium: Foam Border - or ABD pad and tape Wound #37 Left,Lateral Malleolus: Dry Gauze Wound #38 Right Toe - Web between 1st and 2nd: Kerlix/Rolled Gauze - secure with tape Dry Gauze Edema Control: 4 layer compression: Left lower extremity Elevate legs to the level of the heart or above for 30 minutes daily and/or when sitting, a frequency of: - throughout the day Support Garment 30-40  mm/Hg pressure to: - Juxtalite to right leg Off-Loading: Low air-loss mattress (Group 2) Roho cushion for wheelchair Turn and reposition every 2 hours - out of wheelchair throughout the day, try to lay on sides, sleep in the bed not the recliner Consults ordered were: Vascular Surgeon -  Venous insufficiency bilateral lower legs with non healing ulcers 1. Silver alginate on the right for second toe webspace, Iodoflex on the left dorsal toe area. 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 I decreased him to 4 layer compression on the right to see if we can control some of the venous hypertension. He had venous ablations in 2017 3. Bilateral x-rays, he promises to go get the foot x-rays today 4. I am going to refer him to vascular surgery to look at whether they feel venous hypertension is playing a role in the wounds on especially the left foot and ankle Electronic Signature(s) Signed: 01/28/2020 5:29:15 PM By: Linton Ham MD Entered By: Linton Ham on 01/28/2020 08:51:55 -------------------------------------------------------------------------------- SuperBill Details Patient Name: Date of Service: [name] 01/28/2020 Medical Record [id number] Patient Account Number: [id number] Date of Birth/Sex: Treating RN: 01/03/1986 (34 y.o. [name] Primary Care Provider: [place], [place] Other Clinician: Referring Provider: Treating Provider/Extender:, [name], [name]: 212 Diagnosis Coding ICD-10 Codes Code Description L97.521 Non-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 L97.511 Non-pressure chronic ulcer of other part of right foot limited to breakdown of skin 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, complete 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, stage 3 L97.321 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 to breakdown of skin L03.116 Cellulitis of left lower limb Facility  Procedures Physician Procedures CPT4 Code Description: [id number] - WC PHYS SUBQ TISS 20 SQ CM ICD-10 Diagnosis Description L97.521 Non-pressure chronic ulcer of other part of left foot lim L97.321 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 to break Modifier: ited to breakdo down of skin Quantity: 1 wn of skin Electronic Signature(s) Signed: 01/28/2020 5:29:15 PM By: Linton Ham MD Entered By: Linton Ham on 01/28/2020 08:52:20

## 2020-01-30 NOTE — Progress Notes (Signed)
[name], [name] ([id number]) Visit Report for 01/28/2020 Arrival Information Details Patient Name: Date of Service: [name], [name] 01/28/2020 7:30 AM Medical Record [id number] Patient Account Number: [id number] Date of Birth/Sex: Treating RN: [birth year] (34 y.o. [name]) Carlene Coria Primary Care Bronwyn Belasco: [place], [place] Other Clinician: Referring Eisen Robenson: Treating Cathie Bonnell/Extender:Robson, [name], [name]: 212 Visit Information History Since Last Visit All ordered tests and consults were completed: No Patient Arrived: Wheel Chair Added or deleted any medications: No Arrival Time: 08:11 Any new allergies or adverse reactions: No Accompanied By: self Had a fall or experienced change in No activities of daily living that may affect Transfer Assistance: None risk of falls: Patient Identification Verified: Yes Signs or symptoms of abuse/neglect since last No Secondary Verification Process Completed: Yes visito Patient Requires Transmission-Based No Hospitalized since last visit: No Precautions: Implantable device outside of the clinic excluding No Patient Has Alerts: Yes cellular tissue based products placed in the center Patient Alerts: R ABI = since last visit: 1.0 Has Dressing in Place as Prescribed: Yes L ABI = Has Compression in Place as Prescribed: Yes 1.1 Pain Present Now: No Electronic Signature(s) Signed: 01/30/2020 7:20:02 AM By: Carlene Coria RN Entered By: Carlene Coria on 01/28/2020 08:11:29 -------------------------------------------------------------------------------- Compression Therapy Details Patient Name: Date of Service: [name], [name] 01/28/2020 7:30 AM Medical Record [id number] Patient Account Number: [id number] Date of Birth/Sex: Treating RN: 08/31/1986 (34 y.o. [name] Primary Care Ziair Penson: [place], [place] Other Clinician: Referring Enjoli Tidd: Treating Akif Weldy/Extender:Robson, [name], [name] Weeks in  Treatment: 212 Compression Therapy Performed for Wound Wound #37 Left,Lateral Malleolus Assessment: Performed By: Clinician Levan Hurst, RN Compression Type: Four Layer Post Procedure Diagnosis Same as Pre-procedure Electronic Signature(s) Signed: 01/28/2020 5:59:01 PM By: Levan Hurst RN, BSN Entered By: Levan Hurst on 01/28/2020 08:44:47 -------------------------------------------------------------------------------- Encounter Discharge Information Details Patient Name: Date of Service: [name], [name] 01/28/2020 7:30 AM Medical Record [id number] Patient Account Number: [id number] Date of Birth/Sex: Treating RN: [birth year] (34 y.o. [name] Primary Care Lizvet Chunn: [place], [place] Other Clinician: Referring Rease Wence: Treating Edge Mauger/Extender:Robson, [name], [name]: 212 Encounter Discharge Information Items Post Procedure Vitals Discharge Condition: Stable Temperature (F): 98.2 Ambulatory Status: Wheelchair Pulse (bpm): 109 Discharge Destination: Home Respiratory Rate (breaths/min): 18 Transportation: Other Blood Pressure (mmHg): 115/68 Accompanied By: self Schedule Follow-up Appointment: Yes Clinical Summary of Care: Patient Declined Electronic Signature(s) Signed: 01/28/2020 5:31:36 PM By: Kela Millin Entered By: Kela Millin on 01/28/2020 08:44:41 -------------------------------------------------------------------------------- Lower Extremity Assessment Details Patient Name: Date of Service: [name], [name] 01/28/2020 7:30 AM Medical Record [id number] Patient Account Number: [id number] Date of Birth/Sex: Treating RN: 1986-01-05 (34 y.o. [name] Primary Care Yecenia Dalgleish: [place], [place] Other Clinician: Referring Sherell Christoffel: Treating Faigy Stretch/Extender:Robson, [name], [name]: 212 Edema Assessment Assessed: [Left: No] [Right: No] Edema: [Left: Yes] [Right: Yes] Calf Left:  Right: Point of Measurement: 33 cm From Medial Instep 28 cm 31 cm Ankle Left: Right: Point of Measurement: 10 cm From Medial Instep 27 cm 22 cm Electronic Signature(s) Signed: 01/30/2020 7:20:02 AM By: Carlene Coria RN Entered By: Carlene Coria on 01/28/2020 08:12:54 -------------------------------------------------------------------------------- Multi Wound Chart Details Patient Name: Date of Service: [name], [name] 01/28/2020 7:30 AM Medical Record [id number] Patient Account Number: [id number] Date of Birth/Sex: Treating RN: May 26, 1986 (34 y.o. [name] Primary Care Hendricks Schwandt: [name], [name] Other Clinician: Referring Celie Desrochers: Treating Liberty Seto/Extender:Robson, [name], [name]: 212 Vital Signs Height(in): 70 Pulse(bpm): 109 Weight(lbs): 216 Blood Pressure(mmHg): 115/68 Body Mass Index(BMI): 31  Temperature(F): 98.2 Respiratory 18 Rate(breaths/min): Photos: [24:No Photos] [35:No Photos] [37:No Photos] Wound Location: [24:Left, Dorsal Foot] [35:Left Ischium] [37:Left, Lateral Malleolus] Wounding Event: [24:Gradually Appeared] [35:Gradually Appeared] [37:Gradually Appeared] Primary Etiology: [24:Inflammatory] [35:Pressure Ulcer] [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] Comorbid History: [24:Sleep Apnea, Hypertension, Sleep Apnea, Hypertension, Sleep Apnea, Hypertension, Paraplegia] [35:Paraplegia] [37:Paraplegia] Date Acquired: [24:03/08/2018] [35:04/28/2019] [37:11/26/2019] Weeks of Treatment: [24:98] [35:39] [37:9] Wound Status: [24:Open] [35:Open] [37:Open] Clustered Wound: [24:Yes] [35:Yes] [37:No] Clustered Quantity: [24:1] [35:1] [37:N/A] Measurements L x W x D 3.2x2.7x0.4 [35:3.5x0.6x0.1] [37:3.2x1.5x0.1] (cm) Area (cm) : [24:6.786] [35:1.649] [37:3.77] Volume (cm) : [24:2.714] [35:0.165] [37:0.377] % Reduction in Area: [24:-2775.40%] [35:88.30%] [37:4.70%] % Reduction in Volume: -11208.30% [35:88.30%] [37:4.80%] Classification: [24:Full Thickness  Without Exposed Support Structures] [35:Category/Stage III] [37:Full Thickness Without Exposed Support Structures] Exudate Amount: [24:Medium] [35:Medium] [37:Medium] Exudate Type: [24:Serosanguineous] [35:Serosanguineous] [37:Serosanguineous] Exudate Color: [24:red, brown] [35:red, brown] [37:red, brown] Wound Margin: [24:Distinct, outline attached] [35:Flat and Intact] [37:Flat and Intact] Granulation Amount: [24:Small (1-33%)] [35:Large (67-100%)] [37:Small (1-33%)] Granulation Quality: [24:Red] [35:Red, Friable] [37:N/A] Necrotic Amount: [24:Large (67-100%)] [35:None Present (0%)] [37:Large (67-100%)] Exposed Structures: [24:Fat Layer (Subcutaneous Tissue) Exposed: Yes Fascia: No Tendon: No Muscle: No Joint: No Bone: No] [35:Fat Layer (Subcutaneous Tissue) Exposed: Yes Fascia: No Tendon: No Muscle: No Joint: No Bone: No] [37:Fat Layer (Subcutaneous Tissue) Exposed: Yes  Fascia: No Tendon: No Muscle: No Joint: No Bone: No] Epithelialization: [24:None] [35:Small (1-33%)] [37:Small (1-33%)] Debridement: [24:Debridement - Excisional] [35:N/A] [37:N/A] Pre-procedure [24:08:24] [35:N/A] [37:N/A] Verification/Time Out Taken: Tissue Debrided: [24:Subcutaneous, Slough] [35:N/A] [37:N/A] Level: [24:Skin/Subcutaneous Tissue] [35:N/A] [37:N/A] Debridement Area (sq cm):8.64 [35:N/A] [37:N/A] Instrument: [24:Curette] [35:N/A] [37:N/A] Bleeding: [24:Moderate] [35:N/A] [37:N/A] Hemostasis Achieved: [24:Pressure] [35:N/A] [37:N/A] Procedural Pain: [24:0] [35:N/A] [37:N/A] Post Procedural Pain: [24:0] [35:N/A] [37:N/A] Debridement Treatment Procedure was tolerated [35:N/A] [37:N/A] Response: [24:well] Post Debridement [24:3.2x2.7x0.4] [35:N/A] [37:N/A] Measurements L x W x D (cm) Post Debridement [24:2.714] [35:N/A] [37:N/A] Volume: (cm) Procedures Performed: Debridement [24:38] [35:N/A 39] [37:Compression Therapy] Photos: [24:No Photos] [35:No Photos] [37:N/A] Wound Location: [24:Right Toe - Web  between Left, Proximal, Dorsal Foot N/A 1st and 2nd] Wounding Event: [24:Gradually Appeared] [35:Gradually Appeared] [37:N/A] Primary Etiology: [24:Inflammatory] [35:Inflammatory] [37:N/A] Comorbid History: [24:Sleep Apnea, Hypertension, Sleep Apnea, Hypertension, N/A Paraplegia] [35:Paraplegia] Date Acquired: [24:11/30/2019] [35:01/24/2020] [37:N/A] Weeks of Treatment: [24:8] [35:0] [37:N/A] Wound Status: [24:Open] [35:Open] [37:N/A] Clustered Wound: [24:No] [35:No] [37:N/A] Clustered Quantity: [24:N/A] [35:N/A] [37:N/A] Measurements L x W x D 3x2x0.2 [35:1.4x1.4x0.1] [37:N/A] (cm) Area (cm) : [id number] [35:1.539] [37:N/A] Volume (cm) : [24:0.942] [35:0.154] [37:N/A] % Reduction in Area: [24:-1327.90%] [35:0.00%] [37:N/A] % Reduction in Volume: -307.80% [35:0.00%] [37:N/A] Classification: [24:Full Thickness Without Exposed Support Structures Exposed Support Structures] [35:Full Thickness Without] [37:N/A] Exudate Amount: [24:Medium] [35:Medium] [37:N/A] Exudate Type: [24:Serosanguineous] [35:Serosanguineous] [37:N/A] Exudate Color: [24:red, brown] [35:red, brown] [37:N/A] Wound Margin: [24:Flat and Intact] [35:Flat and Intact] [37:N/A] Granulation Amount: [24:Small (1-33%)] [35:Large (67-100%)] [37:N/A] Granulation Quality: [24:Red] [35:Red, Pink] [37:N/A] Necrotic Amount: [24:Large (67-100%)] [35:N/A] [37:N/A] Exposed Structures: [24:Fat Layer (Subcutaneous Tissue) Exposed: Yes Fascia: No Tendon: No Muscle: No Joint: No Bone: No] [35:Fat Layer (Subcutaneous Tissue) Exposed: Yes Fascia: No Tendon: No Muscle: No Joint: No Bone: No] [37:N/A] Epithelialization: [24:Small (1-33%)] [35:None] [37:N/A] Debridement: [24:N/A] [35:N/A] [37:N/A] Tissue Debrided: [24:N/A] [35:N/A] [37:N/A] Level: [24:N/A] [35:N/A] [37:N/A] Debridement Area (sq cm):N/A [35:N/A] [37:N/A] Instrument: [24:N/A] [35:N/A] [37:N/A] Bleeding: [24:N/A] [35:N/A] [37:N/A] Hemostasis Achieved: [24:N/A] [35:N/A]  [37:N/A] Procedural Pain: [24:N/A] [35:N/A] [37:N/A] Post Procedural Pain: [24:N/A] [35:N/A] [37:N/A] Debridement Treatment N/A [35:N/A] [37:N/A] Response: Post Debridement [24:N/A] [35:N/A] [37:N/A] Measurements L x W x D (cm) Post Debridement [24:N/A] [  35:N/A] [37:N/A] Volume: (cm) Procedures Performed: N/A [35:N/A] [37:N/A] Treatment Notes Wound #24 (Left, Dorsal Foot) 1. Cleanse With Wound Cleanser Soap and water 3. Primary Dressing Applied Polymem Ag 4. Secondary Dressing ABD Pad Dry Gauze 6. Support Layer Applied 4 layer compression wrap Wound #35 (Left Ischium) 1. Cleanse With Wound Cleanser 2. Periwound Care Skin Prep 3. Primary Dressing Applied Polymem Ag 4. Secondary Dressing Foam Wound #37 (Left, Lateral Malleolus) 1. Cleanse With Wound Cleanser Soap and water 3. Primary Dressing Applied Polymem Ag 4. Secondary Dressing ABD Pad Dry Gauze 6. Support Layer Applied 4 layer compression wrap Wound #38 (Right Toe - Web between 1st and 2nd) 1. Cleanse With Wound Cleanser 3. Primary Dressing Applied Calcium Alginate Ag 4. Secondary Dressing Dry Gauze Roll Gauze 5. Secured With Tape Notes juxtalite Wound #39 (Left, Proximal, Dorsal Foot) 1. Cleanse With Wound Cleanser Soap and water 3. Primary Dressing Applied Iodoflex Polymem Ag 4. Secondary Dressing Dry Gauze 6. Support Layer Applied 4 layer compression wrap Notes iodoflex to dorsal foot, proximal foot is polymem Youth worker) Signed: 01/28/2020 5:29:15 PM By: Linton Ham MD Signed: 01/28/2020 5:59:01 PM By: Levan Hurst RN, BSN Entered By: Linton Ham on 01/28/2020 08:47:56 -------------------------------------------------------------------------------- Multi-Disciplinary Care Plan Details Patient Name: Date of Service: CRIBBNemecio, Milner 01/28/2020 7:30 AM Medical Record [id number] Patient Account Number: [id number] Date of Birth/Sex: Treating RN: 29-Jan-1986 (34  y.o. [name] Primary Care Anayiah Howden: [name], [name] Other Clinician: Referring Amber Williard: Treating Marki Frede/Extender:Robson, [name], [name]: 212 Active Inactive Wound/Skin Impairment Nursing Diagnoses: Impaired tissue integrity Knowledge deficit related to ulceration/compromised skin integrity Goals: Patient/caregiver will verbalize understanding of skin care regimen Date Initiated: 01/05/2016 Target Resolution Date: 02/15/2020 Goal Status: Active Ulcer/skin breakdown will have a volume reduction of 30% by week 4 Date Initiated: 01/05/2016 Date Inactivated: 12/22/2017 Target Resolution Date: 01/19/2018 Unmet Reason: complex Goal Status: Unmet wounds, infection Interventions: Assess patient/caregiver ability to obtain necessary supplies Assess ulceration(s) every visit Provide education on ulcer and skin care Notes: Electronic Signature(s) Signed: 01/28/2020 5:59:01 PM By: Levan Hurst RN, BSN Entered By: Levan Hurst on 01/28/2020 07:44:00 -------------------------------------------------------------------------------- Pain Assessment Details Patient Name: Date of Service: Duan, [name] 01/28/2020 7:30 AM Medical Record [id number] Patient Account Number: [id number] Date of Birth/Sex: Treating RN: 09-Oct-1986 (34 y.o. [name] Primary Care Desmond Tufano: [place], [place] Other Clinician: Referring Gabriela Giannelli: Treating Khylin Gutridge/Extender:Robson, [name], [name]: 212 Active Problems Location of Pain Severity and Description of Pain Patient Has Paino No Site Locations Pain Management and Medication Current Pain Management: Electronic Signature(s) Signed: 01/30/2020 7:20:02 AM By: Carlene Coria RN Entered By: Carlene Coria on 01/28/2020 08:12:05 -------------------------------------------------------------------------------- Patient/Caregiver Education Details Patient Name: Date of Service: CRIBBGrace [name]  3/8/2021andnbsp7:30 AM Medical Record [id number] Patient Account Number: [id number] Date of Birth/Gender: [birth year] (33 y.o. M) Treating RN: Levan Hurst Primary Care Physician: Janine Limbo Other Clinician: Referring Physician: Treating Physician/Extender:Robson, Pecola Leisure in Treatment: 15 Education Assessment Education Provided To: Patient Education Topics Provided Wound/Skin Impairment: Methods: Explain/Verbal Responses: State content correctly Electronic Signature(s) Signed: 01/28/2020 5:59:01 PM By: Levan Hurst RN, BSN Entered By: Levan Hurst on 01/28/2020 07:44:17 -------------------------------------------------------------------------------- Wound Assessment Details Patient Name: Date of Service: [name], [name] 01/28/2020 7:30 AM Medical Record [id number] Patient Account Number: [id number] Date of Birth/Sex: Treating RN: [birth year] (34 y.o. [name] Primary Care Hernan Turnage: [place], [place] Other Clinician: Referring Whitman Meinhardt: Treating Rakin Lemelle/Extender:Robson, [name], [name]: 212 Wound Status Wound Number: 24 Primary Etiology: Inflammatory  Wound Location: Left Foot - Dorsal Wound Status: Open Wounding Event: Gradually Appeared Comorbid Sleep Apnea, Hypertension, History: Paraplegia Date Acquired: 03/08/2018 Weeks Of Treatment: 98 Clustered Wound: Yes Photos Wound Measurements Length: (cm) 3.2 % Reduct Width: (cm) 2.7 % Reduct Depth: (cm) 0.4 Epitheli Clustered Quantity: 1 Tunnelin Area: (cm) 6.786 Undermi Volume: (cm) 2.714 Wound Description Classification: Full Thickness Without Exposed Support Foul Odo Structures Slough/F Wound Distinct, outline attached Margin: Exudate Medium Amount: Exudate Serosanguineous Type: Exudate red, brown Color: Wound Bed Granulation Amount: Small (1-33%) Granulation Quality: Red Fascia E Necrotic Amount: Large (67-100%) Fat Laye Necrotic Quality:  Adherent Slough Tendon E Muscle E Joint Ex Bone Exp r After Cleansing: No ibrino Yes Exposed Structure xposed: No r (Subcutaneous Tissue) Exposed: Yes xposed: No xposed: No posed: No osed: No ion in Area: -2775.4% ion in Volume: -11208.3% alization: None g: No ning: No Treatment Notes Wound #24 (Left, Dorsal Foot) 1. Cleanse With Wound Cleanser Soap and water 3. Primary Dressing Applied Polymem Ag 4. Secondary Dressing ABD Pad Dry Gauze 6. Support Layer Applied 4 layer compression wrap Electronic Signature(s) Signed: 01/29/2020 3:29:05 PM By: Mikeal Hawthorne EMT/HBOT Signed: 01/30/2020 7:20:02 AM By: Carlene Coria RN Entered By: Mikeal Hawthorne on 01/29/2020 14:48:48 -------------------------------------------------------------------------------- Wound Assessment Details Patient Name: Date of Service: [name], [name] 01/28/2020 7:30 AM Medical Record [id number] Patient Account Number: [id number] Date of Birth/Sex: Treating RN: 08-Jul-1986 (33 y.o. [name] Primary Care Trisa Cranor: [place], [place] Other Clinician: Referring Aleysha Meckler: Treating Gradyn Shein/Extender:Robson, [name], [name]: 212 Wound Status Wound Number: 35 Primary Etiology: Pressure Ulcer Wound Location: Left Ischium Wound Status: Open Wounding Event: Gradually Appeared Comorbid Sleep Apnea, Hypertension, History: Paraplegia Date Acquired: 04/28/2019 Weeks Of Treatment: 39 Clustered Wound: Yes Photos Wound Measurements Length: (cm) 3.5 Width: (cm) 0.6 Depth: (cm) 0.1 Clustered Quantity: 1 Area: (cm) 1.649 Volume: (cm) 0.165 Wound Description Classification: Category/Stage III Wound Margin: Flat and Intact Exudate Amount: Medium Exudate Type: Serosanguineous Exudate Color: red, brown Wound Bed Granulation Amount: Large (67-100%) Granulation Quality: Red, Friable Necrotic Amount: None Present (0%) fter Cleansing: No ino No Exposed Structure ed:  No ubcutaneous Tissue) Exposed: Yes posed: No posed: No osed: No sed: No % Reduction in Area: 88.3% % Reduction in Volume: 88.3% Epithelialization: Small (1-33%) Tunneling: No Undermining: No Foul Odor A Slough/Fibr Fascia Expos Fat Layer (S Tendon Ex Muscle Ex Joint Exp Bone Expo Treatment Notes Wound #35 (Left Ischium) 1. Cleanse With Wound Cleanser 2. Periwound Care Skin Prep 3. Primary Dressing Applied Polymem Ag 4. Secondary Dressing Foam Electronic Signature(s) Signed: 01/29/2020 3:29:05 PM By: Mikeal Hawthorne EMT/HBOT Signed: 01/30/2020 7:20:02 AM By: Carlene Coria RN Entered By: Mikeal Hawthorne on 01/29/2020 14:53:55 -------------------------------------------------------------------------------- Wound Assessment Details Patient Name: Date of Service: [name], [name] 01/28/2020 7:30 AM Medical Record [id number] Patient Account Number: [id number] Date of Birth/Sex: Treating RN: Mar 07, 1986 (33 y.o. [name] Primary Care Huberta Tompkins: [place], [place] Other Clinician: Referring Ladd Cen: Treating Rolla Servidio/Extender:Robson, [name], [name]: 212 Wound Status Wound Number: 37 Primary Etiology: Venous Leg Ulcer Wound Location: Left Malleolus - Lateral Wound Status: Open Wounding Event: Gradually Appeared Comorbid Sleep Apnea, Hypertension, History: Paraplegia Date Acquired: 11/26/2019 Weeks Of Treatment: 9 Clustered Wound: No Photos Wound Measurements Length: (cm) 3.2 % Reducti Width: (cm) 1.5 % Reduct Depth: (cm) 0.1 Epitheli Area: (cm) 3.77 Tunneli Volume: (cm) 0.377 Undermi Wound Description Classification: Full Thickness Without Exposed Support Foul Odo Structures Slough/F Wound Flat and Intact Margin: Exudate Medium Amount: Exudate Serosanguineous Type:  Exudate red, brown Color: Wound Bed Granulation Amount: Small (1-33%) Necrotic Amount: Large (67-100%) Fascia E Necrotic Quality: Adherent Slough Fat Laye Tendon  E Muscle E Joint Ex Bone Exp r After Cleansing: No ibrino Yes Exposed Structure xposed: No r (Subcutaneous Tissue) Exposed: Yes xposed: No xposed: No posed: No osed: No on in Area: 4.7% ion in Volume: 4.8% alization: Small (1-33%) ng: No ning: No Treatment Notes Wound #37 (Left, Lateral Malleolus) 1. Cleanse With Wound Cleanser Soap and water 3. Primary Dressing Applied Polymem Ag 4. Secondary Dressing ABD Pad Dry Gauze 6. Support Layer Applied 4 layer compression wrap Electronic Signature(s) Signed: 01/29/2020 3:29:05 PM By: Mikeal Hawthorne EMT/HBOT Signed: 01/30/2020 7:20:02 AM By: Carlene Coria RN Entered By: Mikeal Hawthorne on 01/29/2020 14:53:14 -------------------------------------------------------------------------------- Wound Assessment Details Patient Name: Date of Service: [name], [name] 01/28/2020 7:30 AM Medical Record [id number] Patient Account Number: [id number] Date of Birth/Sex: Treating RN: [birth year] (33 y.o. [name] Primary Care Calyn Sivils: [name], [name] Other Clinician: Referring Haylea Schlichting: Treating Makinzey Banes/Extender:Robson, [name], [name]: 212 Wound Status Wound Number: 38 Primary Etiology: Inflammatory Wound Location: Right Toe - Web between 1st and Wound Status: Open 2nd Comorbid Sleep Apnea, Hypertension, Wounding Event: Gradually Appeared History: Paraplegia Date Acquired: 11/30/2019 Weeks Of Treatment: 8 Clustered Wound: No Photos Wound Measurements Length: (cm) 3 % Reduct Width: (cm) 2 % Reduct Depth: (cm) 0.2 Epitheli Area: (cm) 4.712 Tunneli Volume: (cm) 0.942 Undermi Wound Description Classification: Full Thickness Without Exposed Support Foul Odo Structures Slough/F Wound Flat and Intact Margin: Exudate Medium Amount: Exudate Serosanguineous Type: Exudate red, brown Color: Wound Bed Granulation Amount: Small (1-33%) Granulation Quality: Red Fascia E Necrotic Amount: Large  (67-100%) Fat Laye Necrotic Quality: Adherent Slough Tendon E Muscle E Joint Ex Bone Exp r After Cleansing: No ibrino Yes Exposed Structure xposed: No r (Subcutaneous Tissue) Exposed: Yes xposed: No xposed: No posed: No osed: No ion in Area: -1327.9% ion in Volume: -307.8% alization: Small (1-33%) ng: No ning: No Treatment Notes Wound #38 (Right Toe - Web between 1st and 2nd) 1. Cleanse With Wound Cleanser 3. Primary Dressing Applied Calcium Alginate Ag 4. Secondary Dressing Dry Gauze Roll Gauze 5. Secured With Tape Notes Development worker, international aid) Signed: 01/29/2020 3:29:05 PM By: Mikeal Hawthorne EMT/HBOT Signed: 01/30/2020 7:20:02 AM By: Carlene Coria RN Entered By: Mikeal Hawthorne on 01/29/2020 14:48:04 -------------------------------------------------------------------------------- Wound Assessment Details Patient Name: Date of Service: [name], [name] 01/28/2020 7:30 AM Medical Record [id number] Patient Account Number: [id number] Date of Birth/Sex: Treating RN: 1986/02/19 (33 y.o. [name] Primary Care Giannamarie Paulus: [place], [place] Other Clinician: Referring Darah Simkin: Treating Adolf Ormiston/Extender:Robson, [name], [name]: 212 Wound Status Wound Number: 39 Primary Etiology: Inflammatory Wound Location: Left Foot - Dorsal, Proximal Wound Status: Open Wounding Event: Gradually Appeared Comorbid Sleep Apnea, Hypertension, History: Paraplegia Date Acquired: 01/24/2020 Weeks Of Treatment: 0 Clustered Wound: No Photos Wound Measurements Length: (cm) 1.4 % Reduct Width: (cm) 1.4 % Reduct Depth: (cm) 0.1 Epitheli Area: (cm) 1.539 Tunneli Volume: (cm) 0.154 Undermi Wound Description Classification: Full Thickness Without Exposed Support Foul Odo Structures Slough/F Wound Flat and Intact Margin: Exudate Medium Amount: Exudate Exudate Serosanguineous Type: Exudate red, brown Color: Wound Bed Granulation Amount: Large  (67-100%) Granulation Quality: Red, Pink Fascia E Fat Laye Tendon E Muscle E Joint Ex Bone Exp r After Cleansing: No ibrino No Exposed Structure xposed: No r (Subcutaneous Tissue) Exposed: Yes xposed: No xposed: No posed: No osed: No ion in Area: 0% ion in Volume: 0%  alization: None ng: No ning: No Treatment Notes Wound #39 (Left, Proximal, Dorsal Foot) 1. Cleanse With Wound Cleanser Soap and water 3. Primary Dressing Applied Iodoflex Polymem Ag 4. Secondary Dressing Dry Gauze 6. Support Layer Applied 4 layer compression wrap Notes iodoflex to dorsal foot, proximal foot is polymem Youth worker) Signed: 01/29/2020 3:29:05 PM By: Mikeal Hawthorne EMT/HBOT Signed: 01/30/2020 7:20:02 AM By: Carlene Coria RN Entered By: Mikeal Hawthorne on 01/29/2020 14:49:43 -------------------------------------------------------------------------------- Vitals Details Patient Name: Date of Service: [name], [name] 01/28/2020 7:30 AM Medical Record [id number] Patient Account Number: [id number] Date of Birth/Sex: Treating RN: 07-02-1986 (33 y.o. [name] Primary [facility] Orvella Digiulio: [place], Pablo Other Clinician: Referring Aster Screws: Treating Mariel Gaudin/Extender:Robson, [name], [name]: 212 Vital Signs Time Taken: 08:00 Temperature (F): 98.2 Height (in): 70 Pulse (bpm): 109 Weight (lbs): 216 Respiratory Rate (breaths/min): 18 Body Mass Index (BMI): 31 Blood Pressure (mmHg): 115/68 Reference Range: 80 - 120 mg / dl Electronic Signature(s) Signed: 01/30/2020 7:20:02 AM By: Carlene Coria RN Entered By: Carlene Coria on 01/28/2020 08:11:52

## 2020-02-04 ENCOUNTER — Other Ambulatory Visit: Payer: Self-pay

## 2020-02-04 ENCOUNTER — Encounter (HOSPITAL_BASED_OUTPATIENT_CLINIC_OR_DEPARTMENT_OTHER): Payer: BC Managed Care – PPO | Admitting: Internal Medicine

## 2020-02-04 DIAGNOSIS — Z89422 Acquired absence of other left toe(s): Secondary | ICD-10-CM | POA: Diagnosis not present

## 2020-02-04 DIAGNOSIS — L03116 Cellulitis of left lower limb: Secondary | ICD-10-CM | POA: Diagnosis not present

## 2020-02-04 DIAGNOSIS — Z880 Allergy status to penicillin: Secondary | ICD-10-CM | POA: Diagnosis not present

## 2020-02-04 DIAGNOSIS — L97322 Non-pressure chronic ulcer of left 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: Secondary | ICD-10-CM | POA: Diagnosis not present

## 2020-02-04 DIAGNOSIS — Z882 Allergy status to sulfonamides status: Secondary | ICD-10-CM | POA: Diagnosis not present

## 2020-02-04 DIAGNOSIS — Z881 Allergy status to other antibiotic agents status: Secondary | ICD-10-CM | POA: Diagnosis not present

## 2020-02-04 DIAGNOSIS — L97922 Non-pressure chronic ulcer of unspecified part of left lower leg with fat layer exposed: Secondary | ICD-10-CM | POA: Diagnosis not present

## 2020-02-04 DIAGNOSIS — L97321 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 to breakdown of skin: Secondary | ICD-10-CM | POA: Diagnosis not present

## 2020-02-04 DIAGNOSIS — L97521 Non-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: Secondary | ICD-10-CM | POA: Diagnosis not present

## 2020-02-04 DIAGNOSIS — L97511 Non-pressure chronic ulcer of other part of right foot limited to breakdown of skin: Secondary | ICD-10-CM | POA: Diagnosis not present

## 2020-02-04 DIAGNOSIS — Z87891 Personal history of nicotine dependence: Secondary | ICD-10-CM | POA: Diagnosis not present

## 2020-02-04 DIAGNOSIS — G8221 Paraplegia, complete: Secondary | ICD-10-CM | POA: Diagnosis not present

## 2020-02-04 DIAGNOSIS — L89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, stage 3: Secondary | ICD-10-CM | POA: Diagnosis not present

## 2020-02-04 DIAGNOSIS — I872 Venous insufficiency (chronic) (peripheral): Secondary | ICD-10-CM | POA: Diagnosis not present

## 2020-02-05 NOTE — Progress Notes (Signed)
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 High switch to silver collagen a week or so ago with hydrogel to keep the area moist. This is been refractory to multiple dressing attempts. He also has areas in his first and second toes bilaterally in the anterior and posterior web space. I had been using Iodoflex here after a prolonged course of silver alginate with ketoconazole was ineffective [question tinea pedis] 07/14/17; patient arrives today with a very difficult adherent material over his left lateral lower leg wound. He also has surrounding erythema and poorly controlled edema. He was switched his Santyl last visit which the nurses are applying once during his doctor visit and once on a nurse visit. He was also reduced to 2 layer compression I'm not exactly sure of the issue here. 07/21/17; better surface today after 1 week of Iodoflex. Significant cellulitis that we treated last week also better. [Doxycycline] 07/28/17 better surface today with now 2 weeks of Iodoflex. Significant cellulitis treated with doxycycline. He has now completed the  doxycycline and he is back to his usual degree of chronic venous inflammation/stasis dermatitis. He reminds me he has had  ablations surgery here 08/04/17; continued improvement with Iodoflex to the left lateral leg wound in terms of the surface of the wound although the dimensions are better. 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 Problematic areas on the plantar aspect of the first second toe web space on the left and the dorsal aspect of the first second toe web space on the right. At one point I felt these were probably related to chronic fungal infections in treated him aggressively for this although we have not made any improvement here. 08/11/17; left lateral leg. Surface continues to improve with the Iodoflex although we are not seeing much improvement in overall wound dimensions. Areas on his plantar left foot and right foot show no improvement. In fact the right foot looks somewhat worse 08/18/17; left lateral leg. We changed to [facility] Blue last week after a prolonged course of Iodoflex which helps get the surface better. It appears that the wound with is improved. Continue with difficult areas on the left dorsal first second and plantar first second on the right 09/01/17; patient arrives in clinic today having had a temperature of 103 yesterday. He was seen in the ER and [facility]. The patient was concerned he could have cellulitis again in the right leg however they diagnosed him with a UTI and he is now on Keflex. He has a history of cellulitis which is been recurrent and difficult but this is been in the left leg, in the past 5 use doxycycline. He does in and out catheterizations at home which are risk factors for UTI 09/08/17; patient will be completing his Keflex this weekend. The erythema on the left leg is considerably better. He has a new wound today on the medial part of the right leg small superficial almost looks like a skin tear. He has worsening of the area on the right dorsal first and second toe. His major area on the  left lateral leg is better. Using Hydrofera Blue on all areas 09/15/17; gradual reduction in width on the long wound in the left lateral leg. No debridement required. He also has wounds on the plantar aspect of his left first second toe web space and on the dorsal aspect of the right first second toe web space. 09/22/17; there continues to be very gradual improvements in the dimensions of the left lateral leg wound. He hasn't round erythematous spot with might be pressure on his wheelchair. There is no evidence obviously of infection no purulence no warmth He has a dry scaled area on the plantar aspect of the left first second toe Improved area on the dorsal right first second toe. 09/29/17; left lateral leg wound continues to improve in dimensions mostly with an is still a fairly long but increasingly narrow wound. He has a dry scaled area on the plantar aspect of his left first second toe web space Increasingly concerning area on the dorsal right first second toe. In fact I am concerned today about possible cellulitis around this wound. The areas extending up his second toe and although there is deformities here almost appears to abut on the nailbed. 10/06/17; left lateral leg wound continues to make very gradual progress. Tissue culture I did from the right first second toe dorsal foot last time grew MRSA and enterococcus which was vancomycin sensitive. This was not sensitive  to clindamycin or doxycycline. He is allergic to Zyvox and sulfa we have therefore arrange for him to have dalvance infusion tomorrow. He is had this in the past and tolerated it well 10/20/17; left lateral leg wound continues to make decent progress. This is certainly reduced in terms of with there is advancing epithelialization.The cellulitis in the right foot looks better although he still has a deep wound in the dorsal aspect of the first second toe web space. Plantar left first toe web space on the left I think is  making some progress 10/27/17; left lateral leg wound continues to make decent progress. Advancing epithelialization.using Hydrofera Blue The right first second toe web space wound is better-looking using silver alginate Improvement in the left plantar first second toe web space. Again using silver alginate 11/03/17 left lateral leg wound continues to make decent progress albeit slowly. Using [facility] The right per second toe web space continues to be a very problematic looking punched out wound. I obtained a piece of tissue for deep culture I did extensively treated this for fungus. It is difficult to imagine that this is a pressure area as the patient states other than going outside he doesn't really wear shoes at home The left plantar first second toe web space looked fairly senescent. Necrotic edges. This required debridement change to [facility] Blue to all wound areas 11/10/17; left lateral leg wound continues to contract. Using Hydrofera Blue On the right dorsal first second toe web space dorsally. Culture I did of this area last week grew MRSA there is not an easy oral option in this patient was multiple antibiotic allergies or intolerances. This was only a rare culture isolate I'm therefore going to use Bactroban under silver alginate On the left plantar first second toe web space. Debridement is required here. This is also unchanged 11/17/17; left lateral leg wound continues to contract using Hydrofera Blue this is no longer the major issue. The major concern here is the right first second toe web space. He now has an open area going from dorsally to the plantar aspect. There is now wound on the inner lateral part of the first toe. Not a very viable surface on this. There is erythema spreading medially into the forefoot. No major change in the left first second toe plantar wound 11/24/17; left lateral leg wound continues to contract using Hydrofera Blue. Nice improvement today The  right first second toe web space all of this looks a lot less angry than last week. I have given him clindamycin and topical Bactroban for MRSA and terbinafine for the possibility of underlining tinea pedis that I could not control with ketoconazole. Looks somewhat better The area on the plantar left first second toe web space is weeping with dried debris around the wound 12/01/17; left lateral leg wound continues to contract he Hydrofera Blue. It is becoming thinner in terms of with nevertheless it is making good improvement. The right first second toe web space looks less angry but still a large necrotic-looking wounds starting on the plantar aspect of the right foot extending between the toes and now extensively on the base of the right second toe. I gave him clindamycin and topical Bactroban for MRSA anterior benefiting for the possibility of underlying tinea pedis. Not looking better today The area on the left first/second toe looks better. Debrided of necrotic debris 12/05/17* the patient was worked in urgently today because over the weekend he found blood on his incontinence bad when he woke  up. He was found to have an ulcer by his wife who does most of his wound care. He came in today for [place] to look at this. He has not had a history of wounds in his buttocks in spite of his paraplegia. 12/08/17; seen in follow-up today at his usual appointment. He was seen earlier this week and found to have a new wound on his buttock. We also follow him for wounds on the left lateral leg, left first second toe web space and right first second toe web space 12/15/17; we have been using Hydrofera Blue to the left lateral leg which has improved. The right first second toe web space has also improved. Left first second toe web space plantar aspect looks stable. The left buttock has worsened using Santyl. Apparently the buttock has drainage 12/22/17; we have been using Hydrofera Blue to the left lateral leg which  continues to improve now 2 small wounds separated by normal skin. He tells [place] he had a fever up to 100 yesterday he is prone to UTIs but has not noted anything different. He does in and out catheterizations. The area between the first and second toes today does not look good necrotic surface covered with what looks to be purulent drainage and erythema extending into the third toe. I had gotten this to something that I thought look better last time however it is not look good today. He also has a necrotic surface over the buttock wound which is expanded. I thought there might be infection under here so I removed a lot of the surface with a #5 curet though nothing look like it really needed culturing. He is been using Santyl to this area 12/27/17; his original wound on the left lateral leg continues to improve using Hydrofera Blue. I gave him samples of Baxdella although he was unable to take them out of fear for an allergic reaction ["lump in his throat"].the culture I did of the purulent drainage from his second toe last week showed both enterococcus and a set Enterobacter I was also concerned about the erythema on the bottom of his foot although paradoxically although this looks somewhat better today. 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 We've been using silver alginate here. They came up today that he sleeps in a recliner, I'm not sure why but I asked him to stop this 01/03/18; his original wound we've been using Hydrofera Blue is now separated into 2 areas. Ulcer on his left buttock is better he is off the recliner and sleeping in bed Finally both wound areas between his first and second toes also looks some better 01/10/18; his original wound on the left lateral leg is now separated into 2 wounds we've been using Hydrofera Blue Ulcer on his left buttock has some drainage. There is a small probing  site going into muscle layer superiorly.using silver alginate -He arrives today with a deep tissue injury on the left heel The wound on the dorsal aspect of his first second toe on the left looks a lot betterusing silver alginate ketoconazole The area on the first second toe web space on the right also looks a lot bette 01/17/18; his original wound on the left lateral leg continues to progress using Hydrofera Blue Ulcer on his left buttock also is smaller surface healthier except for a small probing site going into the muscle layer superiorly. 2.4 cm of tunneling in  this area DTI on his left heel we have only been offloading. Looks better than last week no threatened open no evidence of infection the wound on the dorsal aspect of the first second toe on the left continues to look like it's regressing we have only been using silver alginate and terbinafine orally The area in the first second toe web space on the right also looks to be a lot better using silver alginate and terbinafine I think this was prompted by tinea pedis 01/31/18; the patient was hospitalized in [place] last week apparently for a complicated UTI. He was discharged on cefepime he does in and out catheterizations. In the hospital he was discovered M I don't mild elevation of AST and ALTs and the terbinafine was stopped.predictably the pressure ulcer on his buttock looks betterusing silver alginate. The area on the left lateral leg also is better using Hydrofera Blue. The area between the first and second toes on the left better. First and second toes on the right still substantial but better. Finally the DTI on the left heel has held together and looks like it's resolving 02/07/18-he is here in follow-up evaluation for multiple ulcerations. He has new injury to the lateral aspect of the last issue a pressure ulcer, he states this is from adhesive removal trauma. He states he has tried multiple adhesive products with no success.  All other ulcers appear stable. The left heel DTI is resolving. We will continue with same treatment plan and follow-up next week. 02/14/18; follow-up for multiple areas. He has a new area last week on the lateral aspect of his pressure ulcer more over the posterior trochanter. The original pressure ulcer looks quite stable has healthy granulation. 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 Almost fully epithelialized on the original superior area using Hydrofera Blue DTI on the left heel has peeled off this week to reveal a small superficial wound under denuded skin and subcutaneous tissue Both areas between the first and second toes look better including nothing open on the left 02/21/18; The patient's wounds on his left ischial tuberosity and posterior left greater trochanter actually looked better. He has a large area of irritation around the area which I think is contact dermatitis. I am doubtful that this is fungal His original wound on the left lateral calf continues to improve we have been using Hydrofera Blue There is no open area in the left first second toe web space although there is a lot of thick callus The DTI on the left heel required debridement today of necrotic surface eschar and subcutaneous tissue using silver alginate Finally the area on the right first second toe webspace continues to contract using silver alginate and ketoconazole 02/28/18 Left ischial tuberosity wounds look better using silver alginate. Original wound on the left calf only has one small open area left using Hydrofera Blue DTI on the left heel required debridement mostly removing skin from around this wound surface. Using silver alginate The areas on the right first/second toe web space using silver alginate and ketoconazole 03/08/18 on evaluation today patient appears to be doing decently well as best I  can tell in regard to his wounds. This is the first time that I have seen him as he generally is followed by Dr. Dellia Nims. With that being said none of his wounds appear to be infected he does have an area where there is some skin  covering what appears to be a new wound on the left dorsal surface of his great toe. This is right at the nail bed. With that being said I do believe that debrided away some of the excess skin can be of benefit in this regard. Otherwise he has been tolerating the dressing changes without complication. 03/14/18; patient arrives today with the multiplicity of wounds that we are following. He has not been systemically unwell Original wound on the left lateral calf now only has 2 small open areas we've been using Hydrofera Blue which should continue The deep tissue injury on the left heel requires debridement today. We've been using silver alginate The left first second toe and the right first second toe are both are reminiscence what I think was tinea pedis. Apparently some of the callus Surface between the toes was removed last week when it started draining. Purulent drainage coming from the wound on the ischial tuberosity on the left. 03/21/18-He is here in follow-up evaluation for multiple wounds. 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 He tolerated debridement. The only change to last week's recommendations is to discontinue antifungal cream between toes. He will follow-up next week 03/28/18; following up for multiple wounds;Concern this week is streaking redness and swelling in the right foot. He is going to need antibiotics for this. 03/31/18; follow-up for right foot cellulitis. Streaking redness and swelling in the right foot on 03/28/18. 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 I put him on clindamycin 300 mg every 6 and brought him in for a quick check. He has an open  wound between his first and second toes on the right foot as a potential source. 04/04/18; Right foot cellulitis is resolving he is completing clindamycin. This is truly good news Left lateral calf wound which is initial wound only has one small open area inferiorly this is close to healing out. He has compression stockings. We will use Hydrofera Blue right down to the epithelialization of this Nonviable surface on the left heel which was initially pressure with a DTI. We've been using Hydrofera Blue. I'm going to switch this back to silver alginate Left first second toe/tinea pedis this looks better using silver alginate Right first second toe tinea pedis using silver alginate Large pressure ulcers on theLeft ischial tuberosity. Small wound here Looks better. I am uncertain about the surface over the large wound. Using silver alginate 04/11/18; Cellulitis in the right foot is resolved Left lateral calf wound which was his original wounds still has 2 tiny open areas remaining this is just about closed Nonviable surface on the left heel is better but still requires debridement Left first second toe/tinea pedis still open using silver alginate Right first second toe wound tinea pedis I asked him to go back to using ketoconazole and silver alginate Large pressure ulcers on the left ischial tuberosity this shear injury here is resolved. Wound is smaller. No evidence of infection using silver alginate 04/18/18; Patient arrives with an intense area of cellulitis in the right mid lower calf extending into the right heel area. Bright red and warm. Smaller area on the left anterior leg. He has a significant history of MRSA. He will definitely need antibioticsdoxycycline He now has 2 open areas on the left ischial tuberosity the original large wound and now a satellite area which I think was above his initial satellite areas. Not a wonderful surface on this satellite area surrounding erythema  which looks like pressure related. His left lateral calf wound again his original wound is just about closed Left heel pressure injury still requiring debridement Left first second toe looks a lot better using silver alginate Right first second toe also using silver alginate and ketoconazole cream also looks better 04/20/18; the patient was worked in early today out of concerns with his cellulitis on the right leg. I had started him on doxycycline. This was 2 days ago. His wife was concerned about the swelling in the area. Also concerned about the left buttock. He has not been systemically unwell no fever chills. No nausea vomiting or diarrhea 04/25/18; the patient's left buttock wound is continued to deteriorate he is using Hydrofera Blue. He is still completing clindamycin for the cellulitis on the right leg although all of this looks better. 05/02/18 Left buttock wound still with a lot of drainage and a very tightly adherent fibrinous necrotic surface. He has a deeper area superiorly The left lateral calf wound is still closed DTI wound on the left heel necrotic surface especially the circumference using Iodoflex Areas between his left first second toe and right first second toe both look better. Dorsally and the right first second toe he had a necrotic surface although at smaller. In using silver alginate and ketoconazole. I did a culture last week which was a deep tissue culture of the reminiscence of the open wound on the right first second toe dorsally. This grew a few Acinetobacter and a few methicillin-resistant staph aureus. Nevertheless the area actually this week looked better. I didn't feel the need to specifically address this at least in terms of systemic antibiotics. 05/09/18; wounds are measuring larger more drainage per our intake. We are using Santyl covered with alginate on the large superficial buttock wounds, Iodosorb on the left heel, ketoconazole and silver alginate to the  dorsal first and second toes bilaterally. 05/16/18; The area on his left buttock better in some aspects although the area superiorly over the ischial tuberosity required an extensive debridement.using Santyl Left heel appears stable. Using Iodoflex The areas between his first and second toes are not bad however there is spreading erythema up the dorsal aspect of his left foot this looks like cellulitis again. He is insensate the erythema is really very brilliant.o Erysipelas He went to see an allergist days ago because he was itching part of this he had lab work done. This showed a white count of 15.1 with 70% neutrophils. Hemoglobin of 11.4 and a platelet count of 659,000. Last white count we had in Epic was a 2-1/2 years ago which was 25.9 but he was ill at the time. He was able to show me some lab work that was done by his primary physician the pattern is about the same. 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 But prompted me to go ahead and do x-rays of both feet and the pelvis rule out osteomyelitis. He also had a comprehensive metabolic panel this was reasonably normal his albumin was 3.7 liver function tests BUN/creatinine all normal 05/23/18; x-rays of both his feet from last week were negative for underlying pulmonary abnormality. The x-ray of his pelvis however showed mild irregularity in the left ischial which may represent some early osteomyelitis. The wound in the left ischial continues to get deeper clearly now exposed muscle. Each week necrotic surface material over this area. Whereas the rest of the wounds do not look so bad. The  left ischial wound we have been using Santyl and calcium alginate To the left heel surface necrotic debris using Iodoflex The left lateral leg is still healed Areas on the left dorsal foot and the right dorsal foot are about the same. There is some inflammation on the left which might represent contact  dermatitis, fungal dermatitis I am doubtful cellulitis although this looks better than last week 05/30/18; CT scan done at Hospital did not show any osteomyelitis or abscess. Suggested the possibility of underlying cellulitis although I don't see a lot of evidence of this at the bedside The wound itself on the left buttock/upper thigh actually looks somewhat better. No debridement Left heel also looks better no debridement continue Iodoflex Both dorsal first second toe spaces appear better using Lotrisone. Left still required debridement 06/06/18; Intake reported some purulent looking drainage from the left gluteal wound. Using Santyl and calcium alginate Left heel looks better although still a nonviable surface requiring debridement The left dorsal foot first/second webspace actually expanding and somewhat deeper. I may consider doing a shave biopsy of this area Right dorsal foot first/second webspace appears stable to improved. Using Lotrisone and silver alginate to both these areas 06/13/18 Left gluteal surface looks better. Now separated in the 2 wounds. No debridement required. Still drainage. We'll continue silver alginate Left heel continues to look better with Iodoflex continue this for at least another week Of his dorsal foot wounds the area on the left still has some depth although it looks better than last week. We've been using Lotrisone and silver alginate 06/20/18 Left gluteal continues to look better healthy tissue Left heel continues to look better healthy granulation wound is smaller. He is using Iodoflex and his long as this continues continue the Iodoflex Dorsal right foot looks better unfortunately dorsal left foot does not. There is swelling and erythema of his forefoot. He had minor trauma to this several days ago but doesn't think this was enough to have caused any tissue injury. Foot looks like cellulitis, we have had this problem before 06/27/18 on evaluation today  patient appears to be doing a little worse in regard to his foot ulcer. 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 Both of which would really be his only options for treating this infection. In the past he has been given and effusion of Orbactiv. This is done very well for him in the past again it's one time dosing IV antibiotic therapy. Subsequently I do believe this is something we're gonna need to see about doing at this point in time. Currently his other wounds seem to be doing somewhat better in my pinion I'm pretty happy in that regard. 07/03/18 on evaluation today patient's wounds actually appear to be doing fairly well. He has been tolerating the dressing changes without complication. All in all he seems to be showing signs of improvement. In regard to the antibiotics he has been dealing with infectious disease since I saw him last week as far as getting this scheduled. In the end [facility]'s [facility] this coming Friday. In the meantime he has been continuing to perform the dressing changes in such as previous. There does not appear to be any evidence of infection worsengin at this time. 07/10/18; Since I last saw this man 2 weeks ago things  have actually improved. IV antibiotics of resulted in less forefoot erythema although there is still some present. He is not systemically unwell Left buttock wounds 2 now have no depth there is increased epithelialization Using silver alginate Left heel still requires debridement using Iodoflex Left dorsal foot still with a sizable wound about the size of a border but healthy granulation Right dorsal foot still with a slitlike area using silver alginate 07/18/18; the patient's cellulitis in the left foot is improved in fact I think it is on its way to  resolving. Left buttock wounds 2 both look better although the larger one has hypertension granulation we've been using silver alginate Left heel has some thick circumferential redundant skin over the wound edge which will need to be removed today we've been using Iodoflex Left dorsal foot is still a sizable wound required debridement using silver alginate The right dorsal foot is just about closed only a small open area remains here 07/25/18; left foot cellulitis is resolved Left buttock wounds 2 both look better. Hyper-granulation on the major area Left heel as some debris over the surface but otherwise looks a healthier wound. Using silver collagen Right dorsal foot is just about closed 07/31/18; arrives with our intake nurse worried about purulent drainage from the buttock. We had hyper-granulation here last week His buttock wounds 2 continue to look better Left heel some debris over the surface but measuring smaller. Right dorsal foot unfortunately has openings between the toes Left foot superficial wound looks less aggravated. 08/07/18 Buttock wounds continue to look better although some of her granulation and the larger medial wound. silver alginate Left heel continues to look a lot better.silver collagen Left foot superficial wound looks less stable. Requires debridement. He has a new wound superficial area on the foot on the lateral dorsal foot. Right foot looks better using silver alginate without Lotrisone 08/14/2018; patient was in the ER last week diagnosed with a UTI. He is now on Cefpodoxime and Macrodantin. Buttock wounds continued to be smaller. Using silver alginate Left heel continues to look better using silver collagen Left foot superficial wound looks as though it is improving Right dorsal foot area is just about healed. 08/21/2018; patient is completed his antibiotics for his UTI. He has 2 open areas on the buttocks. There is still not closed although the surface looks  satisfactory. Using silver alginate Left heel continues to improve using silver collagen The bilateral dorsal foot areas which are at the base of his first and second toes/possible tinea pedis are actually stable on the left but worse on the right. The area on the left required debridement of necrotic surface. After debridement I obtained a specimen for PCR culture. The right dorsal foot which is been just about healed last week is now reopened 08/28/2018; culture done on the left dorsal foot showed coag negative staph both staph epidermidis and Lugdunensis. I think this is worthwhile initiating systemic treatment. I will use doxycycline given his long list of allergies. The area on the left heel slightly improved but still requiring debridement. The large wound on the buttock is just about closed whereas the smaller one is larger. Using silver alginate in this area 09/04/2018; patient is completing his doxycycline for the left foot although this continues to be a very difficult wound area with very adherent necrotic debris. We are using silver alginate to all his wounds right foot left foot and the small wounds on his buttock, silver collagen on the left heel. 09/11/2018; once again  this patient has intense erythema and swelling of the left forefoot. Lesser degrees of erythema in the right foot. He has a long list of allergies and intolerances. I will reinstitute doxycycline. 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 Using silver alginate Left heel also looks better using silver collagen Unfortunately both the areas on his feet look worse. The area on the left first second webspace is now gone through to the plantar part of his foot. The area on the left foot anteriorly is irritated with erythema and swelling in the forefoot. 09/25/2018 His wound on the left plantar heel looks better. Using silver collagen The area on the left buttock 2 small  remnant areas. One is closed one is still open. Using silver alginate The areas between both his first and second toes look worse. This in spite of long-standing antifungal therapy with ketoconazole and silver alginate which should have antifungal activity He has small areas around his original wound on the left calf one is on the bottom of the original scar tissue and one superiorly both of these are small and superficial but again given wound history in this site this is worrisome 10/02/2018 Left plantar heel continues to gradually contract using silver collagen Left buttock wound is unchanged using silver alginate The areas on his dorsal feet between his first and second toes bilaterally look about the same. I prescribed clindamycin ointment to see if we can address chronic staph colonization and also the underlying possibility of erythrasma The left lateral lower extremity wound is actually on the lateral part of his ankle. Small open area here. We have been using silver alginate 10/09/2018; Left plantar heel continues to look healthy and contract. No debridement is required Left buttock slightly smaller with a tape injury wound just below which was new this week Dorsal feet somewhat improved I have been using clindamycin Left lateral looks lower extremity the actual open area looks worse although a lot of this is epithelialized. I am going to change to silver collagen today He has a lot more swelling in the right leg although this is not pitting not red and not particularly warm there is a lot of spasm in the right leg usually indicative of people with paralysis of some underlying discomfort. We have reviewed his vascular status from 2017 he had a left greater saphenous vein ablation. I wonder about referring him back to vascular surgery if the area on the left leg continues to deteriorate. 10/16/2018 in today for follow-up and management of multiple lower extremity ulcers. His left  Buttock wound is much lower smaller and almost closed completely. The wound to the left ankle has began to reopen with Epithelialization and some adherent slough. He has multiple new areas to the left foot and leg. The left dorsal foot without much improvement. Wound present between left great webspace and 2nd toe. Erythema and edema present right leg. Right LE ultrasound obtained on 10/10/18 was negative for DVT. 10/23/2018; Left buttock is closed over. Still dry macerated skin but there is no open wound. I suspect this is chronic pressure/moisture Left lateral calf is quite a bit worse than when I saw this last. There is clearly drainage here he has macerated skin into the left plantar heel. We will change the primary dressing to alginate Left dorsal foot has some improvement in overall wound area. Still using clindamycin and silver alginate Right dorsal foot about the same as the left  using clindamycin and silver alginate The erythema in the right leg has resolved. He is DVT rule out was negative Left heel pressure area required debridement although the wound is smaller and the surface is health 10/26/2018 The patient came back in for his nurse check today predominantly because of the drainage coming out of the left lateral leg with a recent reopening of his original wound on the left lateral calf. He comes in today with a large amount of surrounding erythema around the wound extending from the calf into the ankle and even in the area on the dorsal foot. He is not systemically unwell. He is not febrile. Nevertheless this looks like cellulitis. We have been using silver alginate to the area. I changed him to a regular visit and I am going to prescribe him doxycycline. The rationale here is a long list of medication intolerances and a history of MRSA. I did not see anything that I thought would provide a valuable culture 10/30/2018 Follow-up from his appointment 4 days ago with really an  extensive area of cellulitis in the left calf left lateral ankle and left dorsal foot. I put him on doxycycline. He has a long list of medication allergies which are true allergy reactions. 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 In any case he is a lot better today. The erythema especially in the anterior and lateral left calf is better. He still has left ankle erythema. He also is complaining about increasing edema in the right leg we have only been using Kerlix Coban and he has been doing the wraps at home. Finally he has a spotty rash on the medial part of his upper left calf which looks like folliculitis or perhaps wrap occlusion type injury. Small superficial macules not pustules 11/06/18 patient arrives today with again a considerable degree of erythema around the wound on the left lateral calf extending into the dorsal ankle and dorsal foot. This is a lot worse than when I saw this last week. He is on doxycycline really with not a lot of improvement. He has not been systemically unwell Wounds on the; left heel actually looks improved. Original area on the left foot and proximity to the first and second toes looks about the same. He has superficial areas on the dorsal foot, anterior calf and then the reopening of his original wound on the left lateral calf which looks about the same The only area he has on the right is the dorsal webspace first and second which is smaller. He has a large area of dry erythematous skin on the left buttock small open area here. 11/13/2018; the patient arrives in much better condition. The erythema around the wound on the left lateral calf is a lot better. Not sure whether this was the clindamycin or the TCA and ketoconazole or just in the improvement in edema control [stasis dermatitis]. In any case this is a lot better. The area on the left heel is very small and just about resolved  using silver collagen we have been using silver alginate to the areas on his dorsal feet 11/20/2018; his wounds include the left lateral calf, left heel, dorsal aspects of both feet just proximal to the first second webspace. He is stable to slightly improved. I did not think any changes to his dressings were going to be necessary 11/27/2018 he has a reopening on the left buttock which is surrounded by what looks like  tinea or perhaps some other form of dermatitis. The area on the left dorsal foot has some erythema around it I have marked this area but I am not sure whether this is cellulitis or not. Left heel is not closed. Left calf the reopening is really slightly longer and probably worse 1/13; in general things look better and smaller except for the left dorsal foot. Area on the left heel is just about closed, left buttock looks better only a small wound remains in the skin looks better [using Lotrisone] 1/20; the area on the left heel only has a few remaining open areas here. Left lateral calf about the same in terms of size, left dorsal foot slightly larger right lateral foot still not closed. The area on the left buttock has no open wound and the surrounding skin looks a lot better 1/27; the area on the left heel is closed. Left lateral calf better but still requiring extensive debridements. The area on his left buttock is closed. He still has the open areas on the left dorsal foot which is slightly smaller in the right foot which is slightly expanded. We have been using Iodoflex on these areas as well 2/3; left heel is closed. Left lateral calf still requiring debridement using Iodoflex there is no open area on his left buttock however he has dry scaly skin over a large area of this. Not really responding well to the Lotrisone. Finally the areas on his dorsal feet at the level of the first second webspace are slightly smaller on the right and about the same on the left. Both of these  vigorously debrided with Anasept and gauze 2/10; left heel remains closed he has dry erythematous skin over the left buttock but there is no open wound here. Left lateral leg has come in and with. Still requiring debridement we have been using Iodoflex here. Finally the area on the left dorsal foot and right dorsal foot are really about the same extremely dry callused fissured areas. He does not yet have a dermatology appointment 2/17; left heel remains closed. He has a new open area on the left buttock. The area on the left lateral calf is bigger longer and still covered in necrotic debris. No major change in his foot areas bilaterally. I am awaiting for a dermatologist to look on this. We have been using ketoconazole I do not know that this is been doing any good at all. 2/24; left heel remains closed. The left buttock wound that was new reopening last week looks better. The left lateral calf appears better also although still requires debridement. The major area on his foot is the left first second also requiring debridement. We have been putting Prisma on all wounds. I do not believe that the ketoconazole has done too much good for his feet. He will use Lotrisone I am going to give him a 2-week course of terbinafine. We still do not have a dermatology appointment 3/2 left heel remains closed however there is skin over bone in this area I pointed this out to him today. The left buttock wound is epithelialized but still does not look completely stable. The area on the left leg required debridement were using silver collagen here. With regards to his feet we changed to Lotrisone last week and silver alginate. 3/9; left heel remains closed. Left buttock remains closed. The area on the right foot is essentially closed. The left foot remains unchanged. Slightly smaller on the left lateral calf. Using silver collagen  to both of these areas 3/16-Left heel remains closed. Area on right foot is closed.  Left lateral calf above the lateral malleolus open wound requiring debridement with easy bleeding. Left dorsal wound proximal to first toe also debrided. Left ischial area open new. Patient has been using Prisma with wrapping every 3 days. Dermatology appointment is apparently tomorrow.Patient has completed his terbinafine 2-week course with some apparent improvement according to him, there is still flaking and dry skin in his foot on the left 3/23; area on the right foot is reopened. The area on the left anterior foot is about the same still a very necrotic adherent surface. He still has the area on the left leg and reopening is on the left buttock. He apparently saw dermatology although I do not have a note. According to the patient who is usually fairly well informed they did not have any good ideas. Put him on oral terbinafine which she is been on before. 3/30; using silver collagen to all wounds. Apparently his dermatologist put him on doxycycline and rifampin presumably some culture grew staph. I do not have this result. He remains on terbinafine although I have used terbinafine on him before 4/6; patient has had a fairly substantial reopening on the right foot between the first and second toes. He is finished his terbinafine and I believe is on doxycycline and rifampin still as prescribed by dermatology. We have been using silver collagen to all his wounds although the patient reports that he thinks silver alginate does better on the wounds on his buttock. 4/13; the area on his left lateral calf about the same size but it did not require debridement. Left dorsal foot just proximal to the webspace between the first and second toes is about the same. Still nonviable surface. I note some superficial bronze discoloration of the dorsal part of his foot Right dorsal foot just proximal to the first and second toes also looks about the same. I still think there may be the same discoloration I  noted above on the left Left buttock wound looks about the same 4/20; left lateral calf appears to be gradually contracting using silver collagen. 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 The left dorsal foot wound is debrided of tightly adherent necrotic debris and really cleans up quite nicely. The right area is worse with expansion. I did not debride this it is now over the base of the second toe The area on his left buttock is smaller no debridement is required using silver collagen 5/4; left calf continues to make good progress. He arrives with erythema around the wounds on his dorsal foot which even extends to the plantar aspect. Very concerning for coexistent infection. He is finished the erythromycin I gave him for possible erythrasma this does not seem to have helped. The area on the left foot is about the same base of the dorsal toes Is area on the buttock looks improved on the left 5/11; left calf and left buttock continued to make good progress. Left foot is about the same to slightly improved. Major problem is on the right foot. He has not had an x-ray. Deep tissue culture I did last week showed both Enterobacter and E. coli. I did not change the doxycycline I put him on empirically although neither 1 of these were plated to doxycycline. He arrives today with the erythema looking worse on both the dorsal and plantar foot. Macerated skin on the bottom of the  foot. he has not been systemically unwell 5/18-Patient returns at 1 week, left calf wound appears to be making some progress, left buttock wound appears slightly worse than last time, left foot wound looks slightly better, right foot redness is marginally better. X-ray of both feet show no air or evidence of osteomyelitis. Patient is finished his Omnicef and terbinafine. He continues to have macerated skin on the bottom of the left foot as well as right 5/26; left calf  wound is better, left buttock wound appears to have multiple small superficial open areas with surrounding macerated skin. X-rays that I did last time showed no evidence of osteomyelitis in either foot. He is finished cefdinir and doxycycline. I do not think that he was on terbinafine. He continues to have a large superficial open area on the right foot anterior dorsal and slightly between the first and second toes. I did send him to dermatology 2 months ago or so wondering about whether they would do a fungal scraping. I do not believe they did but did do a culture. We have been using silver alginate to the toe areas, he has been using antifungals at home topically either ketoconazole or Lotrisone. We are using silver collagen on the left foot, silver alginate on the right, silver collagen on the left lateral leg and silver alginate on the left buttock 6/1; left buttock area is healed. We have the left dorsal foot, left lateral leg and right dorsal foot. We are using silver alginate to the areas on both feet and silver collagen to the area on his left lateral calf 6/8; the left buttock apparently reopened late last week. He is not really sure how this happened. He is tolerating the terbinafine. Using silver alginate to all wounds 6/15; left buttock wound is larger than last week but still superficial. Came in the clinic today with a report of purulence from the left lateral leg I did not identify any infection Both areas on his dorsal feet appear to be better. He is tolerating the terbinafine. Using silver alginate to all wounds 6/22; left buttock is about the same this week, left calf quite a bit better. His left foot is about the same however he comes in with erythema and warmth in the right forefoot once again. Culture that I gave him in the beginning of May showed Enterobacter and E. coli. I gave him doxycycline and things seem to improve although neither 1 of these organisms was specifically  plated. 6/29; left buttock is larger and dry this week. Left lateral calf looks to me to be improved. Left dorsal foot also somewhat improved right foot completely unchanged. The erythema on the right foot is still present. He is completing the Ceftin dinner that I gave him empirically [see discussion above.) 7/6 - All wounds look to be stable and perhaps improved, the left buttock wound is slightly smaller, per patient bleeds easily, completed ceftin, the right foot redness is less, he is on terbinafine 7/13; left buttock wound about the same perhaps slightly narrower. Area on the left lateral leg continues to narrow. Left dorsal foot slightly smaller right foot about the same. We are using silver alginate on the right foot and Hydrofera Blue to the areas on the left. Unna boot on the left 2 layer compression on the right 7/20; left buttock wound absolutely the same. Area on lateral leg continues to get better. Left dorsal foot require debridement as did the right no major change in the 7/27; left buttock  wound the same size necrotic debris over the surface. The area on the lateral leg is closed once again. His left foot looks better right foot about the same although there is some involvement now of the posterior first second toe area. He is still on terbinafine which I have given him for a month, not certain a centimeter major change 06/25/19-All wounds appear to be slightly improved according to report, left buttock wound looks clean, both foot wounds have minimal to no debris the right dorsal foot has minimal slough. We are using Hydrofera Blue to the left and silver alginate to the right foot and ischial wound. 8/10-Wounds all appear to be around the same, the right forefoot distal part has some redness which was not there before, however the wound looks clean and small. Ischial wound looks about the same with no changes 8/17; his wound on the left lateral calf which was his original chronic  venous insufficiency wound remains closed. Since I last saw him the areas on the left dorsal foot right dorsal foot generally appear better but require debridement. The area on his left initial tuberosity appears somewhat larger to me perhaps hyper granulated and bleeds very easily. We have been using Hydrofera Blue to the left dorsal foot and silver alginate to everything else 8/24; left lateral calf remains closed. The areas on his dorsal feet on the webspace of the first and second toes bilaterally both look better. The area on the left buttock which is the pressure ulcer stage II slightly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 call. 06/21/16: lateral left leg combined wounds. Adherent surface slough area on the left dorsal foot at roughly the fourth toe looks improved 07/12/16; he now has a single linear wound on the lateral left leg. This does not look to be a lot changed from when I lost saw this. The area on his dorsal left foot looks considerably better however. 08/02/16; no major change in the substantial area on his left lateral leg since last time. We have been  using Hydrofera Blue for a prolonged period of time now. The area on his left foot is also unchanged from last review 07/19/16; the area on his dorsal foot on the left looks considerably smaller. He is beginning to have significant rims of epithelialization on the lateral left leg wound. This also looks better. 08/05/16; the patient came in for a nurse visit today. Apparently the area on his left lateral leg looks better and it was wrapped. However in general discussion the patient noted a new area on the dorsal aspect of his right second toe. The exact etiology of this is unclear but likely relates to pressure. 08/09/16 really the area on the left lateral leg did not really look that healthy today perhaps slightly larger and measurements. The area on his dorsal right second toe is improved also the left foot wound looks stable to improved 08/16/16; the area on the last lateral leg did not change any of dimensions. Post debridement with a curet the area looked better. Left foot wound improved and the area on the dorsal right second toe is improved 08/23/16; the area on the left lateral leg may be slightly smaller both in terms of length and width. Aggressive debridement with a curette afterwards the tissue appears healthier. Left foot wound appears improved in the area on the dorsal right second toe is improved 08/30/16 patient developed a fever over the weekend and was seen in an urgent care. Felt to have a UTI and put on doxycycline. He has been since changed over the phone to [facility]. After we took off the wrap on his right leg today the leg is swollen warm and erythematous, probably more likely the source of the fever 09/06/16; have been using collagen to the major left leg wound, silver alginate to the area on his anterior foot/toes 09/13/16; the areas on his anterior foot/toes on both sides appear to be virtually closed. Extensive wound on the left lateral leg perhaps slightly narrower but each  visit still covered an adherent surface slough 09/16/16 patient was in for his usual Thursday nurse visit however the intake nurse noted significant erythema of his dorsal right foot. He is also running a low-grade fever and having increasing spasms in the right leg 09/20/16 here for cellulitis involving his right great toes and forefoot. This is a lot better. Still requiring debridement on his left lateral leg. Santyl direct says he needs prior authorization. Therefore his wife cannot change this at home 09/30/16; the patient's extensive area on the left lateral calf and ankle perhaps somewhat better. Using Santyl. The area on the left toes is healed and I think the area on his right dorsal foot is healed as well. There is no cellulitis or venous inflammation involving the right leg. He is going to need compression stockings here. 10/07/16; the patient's extensive wound on the left lateral calf and ankle does not  measure any differently however there appears to be less adherent surface slough using Santyl and aggressive weekly debridements 10/21/16; no major change in the area on the left lateral calf. Still the same measurement still very difficult to debridement adherent slough and nonviable subcutaneous tissue. This is not really been helped by several weeks of Santyl. Previously for 2 weeks I used Iodoflex for a short period. A prolonged course of Hydrofera Blue didn't really help. I'm not sure why I only used 2 weeks of Iodoflex on this there is no evidence of surrounding infection. He has a small area on the right second toe which looks as though it's progressing towards closure 10/28/16; the wounds on his toes appear to be closed. No major change in the left lateral leg wound although the surface looks somewhat better using Iodoflex. He has had previous arterial studies that were normal. He has had reflux studies and is status post ablation although I don't have any exact notes on which vein  was ablated. I'll need to check the surgical record 11/04/16; he's had a reopening between the first and second toe on the left and right. No major change in the left lateral leg wound. There is what appears to be cellulitis of the left dorsal foot 11/18/16 the patient was hospitalized initially in [place] and then subsequently transferred to [facility] and was admitted there from 11/09/16 through 11/12/16. He had developed progressive cellulitis on the right leg in spite of the doxycycline I gave him. I'd spoken to the hospitalist in [place] who was concerned about continuing leukocytosis. CT scan is what I suggested this was done which showed soft tissue swelling without evidence of osteomyelitis or an underlying abscess blood cultures were negative. At [facility] he was treated with vancomycin and Primaxin and then add an infectious disease consult. He was transitioned to Ceftaroline. He has been making progressive improvement. Overall a severe cellulitis of the right leg. He is been using silver alginate to her original wound on the left leg. The wounds in his toes on the right are closed there is a small open area on the base of the left second toe 11/26/15; the patient's right leg is much better although there is still some edema here this could be reminiscent from his severe cellulitis likely on top of some degree of lymphedema. His left anterior leg wound has less surface slough as reported by her intake nurse. Small wound at the base of the left second toe 12/02/16; patient's right leg is better and there is no open wound here. His left anterior lateral leg wound continues to have a healthy-looking surface. Small wound at the base of the left second toe however there is erythema in the left forefoot which is worrisome 12/16/16; is no open wounds on his right leg. We took measurements for stockings. His left anterior lateral leg wound continues to have a healthy-looking surface. I'm not  sure where we were with the Apligraf run through his insurance. We have been using Iodoflex. He has a thick eschar on the left first second toe interface, I suspect this may be fungal however there is no visible open 12/23/16; no open wound on his right leg. He has 2 small areas left of the linear wound that was remaining last week. We have been using Prisma, I thought I have disclosed this week, we can only look forward to next week 01/03/17; the patient had concerning areas of erythema last week, already on doxycycline for UTI through  his primary doctor. The erythema is absolutely no better there is warmth and swelling both medially from the left lateral leg wound and also the dorsal left foot. 01/06/17- Patient is here for follow-up evaluation of his left lateral leg ulcer and bilateral feet ulcers. He is on oral antibiotic therapy, tolerating that. Nursing staff and the patient states that the erythema is improved from Monday. 01/13/17; the predominant left lateral leg wound continues to be problematic. I had put Apligraf on him earlier this month once. However he subsequently developed what appeared to be an intense cellulitis around the left lateral leg wound. I gave him Dalvance I think on 2/12 perhaps 2/13 he continues on cefdinir. The erythema is still present but the warmth and swelling is improved. I am hopeful that the cellulitis part of this control. I wouldn't be surprised if there is an element of venous inflammation as well. 01/17/17. The erythema is present but better in the left leg. His left lateral leg wound still does not have a viable surface buttons certain parts of this long thin wound it appears like there has been improvement in dimensions. 01/20/17; the erythema still present but much better in the left leg. I'm thinking this is his usual degree of chronic venous inflammation. The wound on the left leg looks somewhat better. Is less surface slough 01/27/17; erythema is back to the  chronic venous inflammation. The wound on the left leg is somewhat better. I am back to the point where I like to try an Apligraf once again 02/10/17; slight improvement in wound dimensions. Apligraf #2. He is completing his doxycycline 02/14/17; patient arrives today having completed doxycycline last Thursday. This was supposed to be a nurse visit however once again he hasn't tense erythema from the medial part of his wound extending over the lower leg. Also erythema in his foot this is roughly in the same distribution as last time. He has baseline chronic venous inflammation however this is a lot worse than the baseline I have learned to accept the on him is baseline inflammation 02/24/17- patient is here for follow-up evaluation. He is tolerating compression therapy. His voicing no complaints or concerns he is here anticipating an Apligraf 03/03/17; he arrives today with an adherent necrotic surface. I don't think this is surface is going to be amenable for Apligraf's. The erythema around his wound and on the left dorsal foot has resolved he is off antibiotics 03/10/17; better-looking surface today. I don't think he can tolerate Apligraf's. He tells me he had a wound VAC after a skin graft years ago to this area and they had difficulty with a seal. The erythema continues to be stable around this some degree of chronic venous inflammation but he also has recurrent cellulitis. We have been using Iodoflex 03/17/17; continued improvement in the surface and may be small changes in dimensions. Using Iodoflex which seems the only thing that will control his surface 03/24/17- He is here for follow up evaluation of his LLE lateral ulceration and ulcer to right dorsal foot/toe space. He is voicing no complaints or concerns, He is tolerating compression wrap. 03/31/17 arrives today with a much healthier looking wound on the left lower extremity. We have been using Iodoflex for a prolonged period of time which has  for the first time prepared and adequate looking wound bed although we have not had much in the way of wound dimension improvement. He also has a small wound between the first and second toe on the right  04/07/17; arrives today with a healthy-looking wound bed and at least the top 50% of this wound appears to be now her. No debridement was required I have changed him to [facility] last week after prolonged Iodoflex. He did not do well with Apligraf's. We've had a re-opening between the first and second toe on the right 04/14/17; arrives today with a healthier looking wound bed contractions and the top 50% of this wound and some on the lesser 50%. Wound bed appears healthy. The area between the first and second toe on the right still remains problematic 04/21/17; continued very gradual improvement. Using [facility] 04/28/17; continued very gradual improvement in the left lateral leg venous insufficiency wound. His periwound erythema is very mild. We have been using Hydrofera Blue. Wound is making progress especially in the superior 50% 05/05/17; he continues to have very gradual improvement in the left lateral venous insufficiency wound. Both in terms with an length rings are improving. I debrided this every 2 weeks with #5 curet and we have been using Hydrofera Blue and again making good progress With regards to the wounds between his right first and second toe which I thought might of been tinea pedis he is not making as much progress very dry scaly skin over the area. Also the area at the base of the left first and second toe in a similar condition 05/12/17; continued gradual improvement in the refractory left lateral venous insufficiency wound on the left. Dimension smaller. Surface still requiring debridement using Hydrofera Blue 05/19/17; continued gradual improvement in the refractory left lateral venous ulceration. Careful inspection of the wound bed underlying rumination suggested some  degree of epithelialization over the surface no debridement indicated. Continue Hydrofera Blue difficult areas between his toes first and third on the left than first and second on the right. I'm going to change to silver alginate from silver collagen. Continue ketoconazole as I suspect underlying tinea pedis 05/26/17; left lateral leg venous insufficiency wound. We've been using Hydrofera Blue. I believe that there is expanding epithelialization over the surface of the wound albeit not coming from the wound circumference. This is a bit of an odd situation in which the epithelialization seems to be coming from the surface of the wound rather than in the exact circumference. There is still small open areas mostly along the lateral margin of the wound. ooHe has unchanged areas between the left first and second and the right first second toes which I been treating for tenia pedis 06/02/17; left lateral leg venous insufficiency wound. We have been using Hydrofera Blue. Somewhat smaller from the wound circumference. The surface of the wound remains a bit on it almost epithelialized sedation in appearance. I use an open curette today debridement in the surface of all of this especially the edges ooSmall open wounds remaining on the dorsal right first and second toe interspace and the plantar left first second toe and her face on the left 06/09/17; wound on the left lateral leg continues to be smaller but very gradual and very dry surface using Hydrofera Blue 06/16/17 requires weekly debridements now on the left lateral leg although this continues to contract. I changed to silver collagen last week because of dryness of the wound bed. Using Iodoflex to the areas on his first and second toes/web space bilaterally 06/24/17; patient with history of paraplegia also chronic venous insufficiency with lymphedema. Has a very difficult wound on the left lateral leg. This has been gradually reducing in terms of with  but comes in with a very dry adherent surface. High switch to silver collagen a week or so ago with hydrogel to keep the area moist. This is been refractory to multiple dressing attempts. He also has areas in his first and second toes bilaterally in the anterior and posterior web space. I had been using Iodoflex here after a prolonged course of silver alginate with ketoconazole was ineffective [question tinea pedis] 07/14/17; patient arrives today with a very difficult adherent material over his left lateral lower leg wound. He also has surrounding erythema and poorly controlled edema. He was switched his Santyl last visit which the nurses are applying once during his doctor visit and once on a nurse visit. He was also reduced to 2 layer compression I'm not exactly sure of the issue here. 07/21/17; better surface today after 1 week of Iodoflex. Significant cellulitis that we treated last week also better. [Doxycycline] 07/28/17 better surface today with now 2 weeks of Iodoflex. Significant cellulitis treated with doxycycline. He has now completed the doxycycline and he is back to his usual degree of chronic venous inflammation/stasis dermatitis. He reminds me he has had ablations surgery here 08/04/17; continued improvement with Iodoflex to the left lateral leg wound in terms of the surface of the wound although the dimensions are better. 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 Problematic areas on the plantar aspect of the first second toe web space on the left and the dorsal aspect of the first second toe web space on the right. At one point I felt these were probably related to chronic fungal infections in treated him aggressively for this although we have not made any improvement here. 08/11/17; left lateral leg. Surface continues to improve with the Iodoflex although we are not seeing much improvement in overall wound  dimensions. Areas on his plantar left foot and right foot show no improvement. In fact the right foot looks somewhat worse 08/18/17; left lateral leg. We changed to [facility] Blue last week after a prolonged course of Iodoflex which helps get the surface better. It appears that the wound with is improved. Continue with difficult areas on the left dorsal first second and plantar first second on the right 09/01/17; patient arrives in clinic today having had a temperature of 103 yesterday. He was seen in the ER and [facility]. The patient was concerned he could have cellulitis again in the right leg however they diagnosed him with a UTI and he is now on Keflex. He has a history of cellulitis which is been recurrent and difficult but this is been in the left leg, in the past 5 use doxycycline. He does in and out catheterizations at home which are risk factors for UTI 09/08/17; patient will be completing his Keflex this weekend. The erythema on the left leg is considerably better. He has a new wound today on the medial part of the right leg small superficial almost looks like a skin tear. He has worsening of the area on the right dorsal first and second toe. His major area on the left lateral leg is better. Using Hydrofera Blue on all areas 09/15/17; gradual reduction in width on the long wound in the left lateral leg. No debridement required. He also has wounds on the plantar aspect of his left first second toe web space and on the dorsal aspect of the right first second toe web space. 09/22/17; there continues to be very gradual improvements in  the dimensions of the left lateral leg wound. He hasn't round erythematous spot with might be pressure on his wheelchair. There is no evidence obviously of infection no purulence no warmth ooHe has a dry scaled area on the plantar aspect of the left first second toe ooImproved area on the dorsal right first second toe. 09/29/17; left lateral leg wound  continues to improve in dimensions mostly with an is still a fairly long but increasingly narrow wound. ooHe has a dry scaled area on the plantar aspect of his left first second toe web space ooIncreasingly concerning area on the dorsal right first second toe. In fact I am concerned today about possible cellulitis around this wound. The areas extending up his second toe and although there is deformities here almost appears to abut on the nailbed. 10/06/17; left lateral leg wound continues to make very gradual progress. Tissue culture I did from the right first second toe dorsal foot last time grew MRSA and enterococcus which was vancomycin sensitive. This was not sensitive to clindamycin or doxycycline. He is allergic to Zyvox and sulfa we have therefore arrange for him to have dalvance infusion tomorrow. He is had this in the past and tolerated it well 10/20/17; left lateral leg wound continues to make decent progress. This is certainly reduced in terms of with there is advancing epithelialization.ooThe cellulitis in the right foot looks better although he still has a deep wound in the dorsal aspect of the first second toe web space. Plantar left first toe web space on the left I think is making some progress 10/27/17; left lateral leg wound continues to make decent progress. Advancing epithelialization.using Hydrofera Blue ooThe right first second toe web space wound is better-looking using silver alginate ooImprovement in the left plantar first second toe web space. Again using silver alginate 11/03/17 left lateral leg wound continues to make decent progress albeit slowly. Using Hydrofera Blue ooThe right per second toe web space continues to be a very problematic looking punched out wound. I obtained a piece of tissue for deep culture I did extensively treated this for fungus. It is difficult to imagine that this is a pressure area as the patient states other than going outside he  doesn't really wear shoes at home ooThe left plantar first second toe web space looked fairly senescent. Necrotic edges. This required debridement oochange to Hydrofera Blue to all wound areas 11/10/17; left lateral leg wound continues to contract. Using Hydrofera Blue ooOn the right dorsal first second toe web space dorsally. Culture I did of this area last week grew MRSA there is not an easy oral option in this patient was multiple antibiotic allergies or intolerances. This was only a rare culture isolate I'm therefore going to use Bactroban under silver alginate ooOn the left plantar first second toe web space. Debridement is required here. This is also unchanged 11/17/17; left lateral leg wound continues to contract using Hydrofera Blue this is no longer the major issue. ooThe major concern here is the right first second toe web space. He now has an open area going from dorsally to the plantar aspect. There is now wound on the inner lateral part of the first toe. Not a very viable surface on this. There is erythema spreading medially into the forefoot. ooNo major change in the left first second toe plantar wound 11/24/17; left lateral leg wound continues to contract using Hydrofera Blue. Nice improvement today ooThe right first second toe web space all of this looks a lot  less angry than last week. I have given him clindamycin and topical Bactroban for MRSA and terbinafine for the possibility of underlining tinea pedis that I could not control with ketoconazole. Looks somewhat better ooThe area on the plantar left first second toe web space is weeping with dried debris around the wound 12/01/17; left lateral leg wound continues to contract he Hydrofera Blue. It is becoming thinner in terms of with nevertheless it is making good improvement. ooThe right first second toe web space looks less angry but still a large necrotic-looking wounds starting on the plantar aspect of the right foot  extending between the toes and now extensively on the base of the right second toe. I gave him clindamycin and topical Bactroban for MRSA anterior benefiting for the possibility of underlying tinea pedis. Not looking better today ooThe area on the left first/second toe looks better. Debrided of necrotic debris 12/05/17* the patient was worked in urgently today because over the weekend he found blood on his incontinence bad when he woke up. He was found to have an ulcer by his wife who does most of his wound care. He came in today for [place] to look at this. He has not had a history of wounds in his buttocks in spite of his paraplegia. 12/08/17; seen in follow-up today at his usual appointment. He was seen earlier this week and found to have a new wound on his buttock. We also follow him for wounds on the left lateral leg, left first second toe web space and right first second toe web space 12/15/17; we have been using Hydrofera Blue to the left lateral leg which has improved. The right first second toe web space has also improved. Left first second toe web space plantar aspect looks stable. The left buttock has worsened using Santyl. Apparently the buttock has drainage 12/22/17; we have been using Hydrofera Blue to the left lateral leg which continues to improve now 2 small wounds separated by normal skin. He tells [place] he had a fever up to 100 yesterday he is prone to UTIs but has not noted anything different. He does in and out catheterizations. The area between the first and second toes today does not look good necrotic surface covered with what looks to be purulent drainage and erythema extending into the third toe. I had gotten this to something that I thought look better last time however it is not look good today. He also has a necrotic surface over the buttock wound which is expanded. I thought there might be infection under here so I removed a lot of the surface with a #5 curet though nothing  look like it really needed culturing. He is been using Santyl to this area 12/27/17; his original wound on the left lateral leg continues to improve using Hydrofera Blue. I gave him samples of Baxdella although he was unable to take them out of fear for an allergic reaction ["lump in his throat"].the culture I did of the purulent drainage from his second toe last week showed both enterococcus and a set Enterobacter I was also concerned about the erythema on the bottom of his foot although paradoxically although this looks somewhat better today. 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 We've been using silver alginate here. They came up today that he sleeps in a recliner, I'm not sure why but I asked him to stop this 01/03/18; his original  wound we've been using Hydrofera Blue is now separated into 2 areas. ooUlcer on his left buttock is better he is off the recliner and sleeping in bed ooFinally both wound areas between his first and second toes also looks some better 01/10/18; his original wound on the left lateral leg is now separated into 2 wounds we've been using Hydrofera Blue ooUlcer on his left buttock has some drainage. There is a small probing site going into muscle layer superiorly.using silver alginate -He arrives today with a deep tissue injury on the left heel ooThe wound on the dorsal aspect of his first second toe on the left looks a lot betterusing silver alginate ketoconazole ooThe area on the first second toe web space on the right also looks a lot bette 01/17/18; his original wound on the left lateral leg continues to progress using Hydrofera Blue ooUlcer on his left buttock also is smaller surface healthier except for a small probing site going into the muscle layer superiorly. 2.4 cm of tunneling in this area ooDTI on his left heel we have only been offloading. Looks better  than last week no threatened open no evidence of infection oothe wound on the dorsal aspect of the first second toe on the left continues to look like it's regressing we have only been using silver alginate and terbinafine orally ooThe area in the first second toe web space on the right also looks to be a lot better using silver alginate and terbinafine I think this was prompted by tinea pedis 01/31/18; the patient was hospitalized in [place] last week apparently for a complicated UTI. He was discharged on cefepime he does in and out catheterizations. In the hospital he was discovered M I don't mild elevation of AST and ALTs and the terbinafine was stopped.predictably the pressure ulcer on his buttock looks betterusing silver alginate. The area on the left lateral leg also is better using Hydrofera Blue. The area between the first and second toes on the left better. First and second toes on the right still substantial but better. Finally the DTI on the left heel has held together and looks like it's resolving 02/07/18-he is here in follow-up evaluation for multiple ulcerations. He has new injury to the lateral aspect of the last issue a pressure ulcer, he states this is from adhesive removal trauma. He states he has tried multiple adhesive products with no success. All other ulcers appear stable. The left heel DTI is resolving. We will continue with same treatment plan and follow-up next week. 02/14/18; follow-up for multiple areas. ooHe has a new area last week on the lateral aspect of his pressure ulcer more over the posterior trochanter. The original pressure ulcer looks quite stable has healthy granulation. 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 Almost fully epithelialized on the original superior area using Hydrofera Blue ooDTI on the left heel has peeled off this week to reveal  a small superficial wound under denuded skin and subcutaneous tissue ooBoth areas between the first and second toes look better including nothing open on the left 02/21/18; ooThe patient's wounds on his left ischial tuberosity and posterior left greater trochanter actually looked better. He has a large area of irritation around the area which I think is contact dermatitis. I am doubtful that this is fungal ooHis original wound on the left lateral calf continues to improve we have been using Hydrofera Blue ooThere is  no open area in the left first second toe web space although there is a lot of thick callus ooThe DTI on the left heel required debridement today of necrotic surface eschar and subcutaneous tissue using silver alginate ooFinally the area on the right first second toe webspace continues to contract using silver alginate and ketoconazole 02/28/18 ooLeft ischial tuberosity wounds look better using silver alginate. ooOriginal wound on the left calf only has one small open area left using Hydrofera Blue ooDTI on the left heel required debridement mostly removing skin from around this wound surface. Using silver alginate ooThe areas on the right first/second toe web space using silver alginate and ketoconazole 03/08/18 on evaluation today patient appears to be doing decently well as best I can tell in regard to his wounds. This is the first time that I have seen him as he generally is followed by Dr. Dellia Nims. With that being said none of his wounds appear to be infected he does have an area where there is some skin covering what appears to be a new wound on the left dorsal surface of his great toe. This is right at the nail bed. With that being said I do believe that debrided away some of the excess skin can be of benefit in this regard. Otherwise he has been tolerating the dressing changes without complication. 03/14/18; patient arrives today with the multiplicity of wounds that we  are following. He has not been systemically unwell ooOriginal wound on the left lateral calf now only has 2 small open areas we've been using Hydrofera Blue which should continue ooThe deep tissue injury on the left heel requires debridement today. We've been using silver alginate ooThe left first second toe and the right first second toe are both are reminiscence what I think was tinea pedis. Apparently some of the callus Surface between the toes was removed last week when it started draining. ooPurulent drainage coming from the wound on the ischial tuberosity on the left. 03/21/18-He is here in follow-up evaluation for multiple wounds. 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 He tolerated debridement. The only change to last week's recommendations is to discontinue antifungal cream between toes. He will follow-up next week 03/28/18; following up for multiple wounds;Concern this week is streaking redness and swelling in the right foot. He is going to need antibiotics for this. 03/31/18; follow-up for right foot cellulitis. Streaking redness and swelling in the right foot on 03/28/18. 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 I put him on clindamycin 300 mg every 6 and brought him in for a quick check. He has an open wound between his first and second toes on the right foot as a potential source. 04/04/18; ooRight foot cellulitis is resolving he is completing clindamycin. This is truly good news ooLeft lateral calf wound which is initial wound only has one small open area inferiorly this is close to healing out. He has compression stockings. We will use Hydrofera Blue right down to the epithelialization of this ooNonviable surface on the left heel which was initially pressure with a DTI. We've been using Hydrofera Blue. I'm going to switch this back to silver alginate ooLeft first second  toe/tinea pedis this looks better using silver alginate ooRight first second toe tinea pedis using silver alginate ooLarge pressure ulcers on theLeft ischial tuberosity. Small wound here Looks better. I am uncertain about the surface over the large wound. Using silver alginate 04/11/18;   ooCellulitis in the right foot is resolved ooLeft lateral calf wound which was his original wounds still has 2 tiny open areas remaining this is just about closed ooNonviable surface on the left heel is better but still requires debridement ooLeft first second toe/tinea pedis still open using silver alginate ooRight first second toe wound tinea pedis I asked him to go back to using ketoconazole and silver alginate ooLarge pressure ulcers on the left ischial tuberosity this shear injury here is resolved. Wound is smaller. No evidence of infection using silver alginate 04/18/18; ooPatient arrives with an intense area of cellulitis in the right mid lower calf extending into the right heel area. Bright red and warm. Smaller area on the left anterior leg. He has a significant history of MRSA. He will definitely need antibioticsoodoxycycline ooHe now has 2 open areas on the left ischial tuberosity the original large wound and now a satellite area which I think was above his initial satellite areas. Not a wonderful surface on this satellite area surrounding erythema which looks like pressure related. ooHis left lateral calf wound again his original wound is just about closed ooLeft heel pressure injury still requiring debridement ooLeft first second toe looks a lot better using silver alginate ooRight first second toe also using silver alginate and ketoconazole cream also looks better 04/20/18; the patient was worked in early today out of concerns with his cellulitis on the right leg. I had started him on doxycycline. This was 2 days ago. His wife was concerned about the swelling in the area. Also  concerned about the left buttock. He has not been systemically unwell no fever chills. No nausea vomiting or diarrhea 04/25/18; the patient's left buttock wound is continued to deteriorate he is using Hydrofera Blue. He is still completing clindamycin for the cellulitis on the right leg although all of this looks better. 05/02/18 ooLeft buttock wound still with a lot of drainage and a very tightly adherent fibrinous necrotic surface. He has a deeper area superiorly ooThe left lateral calf wound is still closed ooDTI wound on the left heel necrotic surface especially the circumference using Iodoflex ooAreas between his left first second toe and right first second toe both look better. Dorsally and the right first second toe he had a necrotic surface although at smaller. In using silver alginate and ketoconazole. I did a culture last week which was a deep tissue culture of the reminiscence of the open wound on the right first second toe dorsally. This grew a few Acinetobacter and a few methicillin-resistant staph aureus. Nevertheless the area actually this week looked better. I didn't feel the need to specifically address this at least in terms of systemic antibiotics. 05/09/18; wounds are measuring larger more drainage per our intake. We are using Santyl covered with alginate on the large superficial buttock wounds, Iodosorb on the left heel, ketoconazole and silver alginate to the dorsal first and second toes bilaterally. 05/16/18; ooThe area on his left buttock better in some aspects although the area superiorly over the ischial tuberosity required an extensive debridement.using Santyl ooLeft heel appears stable. Using Iodoflex ooThe areas between his first and second toes are not bad however there is spreading erythema up the dorsal aspect of his left foot this looks like cellulitis again. He is insensate the erythema is really very brilliant.o Erysipelas He went to see an allergist days  ago because he was itching part of this he had lab work done. This showed a white count of 15.1 with 70%  neutrophils. Hemoglobin of 11.4 and a platelet count of 659,000. Last white count we had in [facility] was a 2-1/2 years ago which was 25.9 but he was ill at the time. He was able to show me some lab work that was done by his primary physician the pattern is about the same. 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 But prompted me to go ahead and do x-rays of both feet and the pelvis rule out osteomyelitis. He also had a comprehensive metabolic panel this was reasonably normal his albumin was 3.7 liver function tests BUN/creatinine all normal 05/23/18; x-rays of both his feet from last week were negative for underlying pulmonary abnormality. The x-ray of his pelvis however showed mild irregularity in the left ischial which may represent some early osteomyelitis. The wound in the left ischial continues to get deeper clearly now exposed muscle. Each week necrotic surface material over this area. Whereas the rest of the wounds do not look so bad. ooThe left ischial wound we have been using Santyl and calcium alginate ooTo the left heel surface necrotic debris using Iodoflex ooThe left lateral leg is still healed ooAreas on the left dorsal foot and the right dorsal foot are about the same. There is some inflammation on the left which might represent contact dermatitis, fungal dermatitis I am doubtful cellulitis although this looks better than last week 05/30/18; CT scan done at Hospital did not show any osteomyelitis or abscess. Suggested the possibility of underlying cellulitis although I don't see a lot of evidence of this at the bedside ooThe wound itself on the left buttock/upper thigh actually looks somewhat better. No debridement ooLeft heel also looks better no debridement continue Iodoflex ooBoth dorsal first second toe spaces appear better using  Lotrisone. Left still required debridement 06/06/18; ooIntake reported some purulent looking drainage from the left gluteal wound. Using Santyl and calcium alginate ooLeft heel looks better although still a nonviable surface requiring debridement ooThe left dorsal foot first/second webspace actually expanding and somewhat deeper. I may consider doing a shave biopsy of this area ooRight dorsal foot first/second webspace appears stable to improved. Using Lotrisone and silver alginate to both these areas 06/13/18 ooLeft gluteal surface looks better. Now separated in the 2 wounds. No debridement required. Still drainage. We'll continue silver alginate ooLeft heel continues to look better with Iodoflex continue this for at least another week ooOf his dorsal foot wounds the area on the left still has some depth although it looks better than last week. We've been using Lotrisone and silver alginate 06/20/18 ooLeft gluteal continues to look better healthy tissue ooLeft heel continues to look better healthy granulation wound is smaller. He is using Iodoflex and his long as this continues continue the Iodoflex ooDorsal right foot looks better unfortunately dorsal left foot does not. There is swelling and erythema of his forefoot. He had minor trauma to this several days ago but doesn't think this was enough to have caused any tissue injury. Foot looks like cellulitis, we have had this problem before 06/27/18 on evaluation today patient appears to be doing a little worse in regard to his foot ulcer. 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 Both of which would really be his only options for treating this infection. In the past he has been given and effusion of  Orbactiv. This is done very well for him in the past again it's one time dosing IV antibiotic  therapy. Subsequently I do believe this is something we're gonna need to see about doing at this point in time. Currently his other wounds seem to be doing somewhat better in my pinion I'm pretty happy in that regard. 07/03/18 on evaluation today patient's wounds actually appear to be doing fairly well. He has been tolerating the dressing changes without complication. All in all he seems to be showing signs of improvement. In regard to the antibiotics he has been dealing with infectious disease since I saw him last week as far as getting this scheduled. In the end [facility]'s [facility] this coming Friday. In the meantime he has been continuing to perform the dressing changes in such as previous. There does not appear to be any evidence of infection worsengin at this time. 07/10/18; ooSince I last saw this man 2 weeks ago things have actually improved. IV antibiotics of resulted in less forefoot erythema although there is still some present. He is not systemically unwell ooLeft buttock wounds o2 now have no depth there is increased epithelialization Using silver alginate ooLeft heel still requires debridement using Iodoflex ooLeft dorsal foot still with a sizable wound about the size of a border but healthy granulation ooRight dorsal foot still with a slitlike area using silver alginate 07/18/18; the patient's cellulitis in the left foot is improved in fact I think it is on its way to resolving. ooLeft buttock wounds o2 both look better although the larger one has hypertension granulation we've been using silver alginate ooLeft heel has some thick circumferential redundant skin over the wound edge which will need to be removed today we've been using Iodoflex ooLeft dorsal foot is still a sizable wound required debridement using silver alginate ooThe right dorsal foot is just about closed only a small open area remains here 07/25/18;  left foot cellulitis is resolved ooLeft buttock wounds o2 both look better. Hyper-granulation on the major area ooLeft heel as some debris over the surface but otherwise looks a healthier wound. Using silver collagen ooRight dorsal foot is just about closed 07/31/18; arrives with our intake nurse worried about purulent drainage from the buttock. We had hyper-granulation here last week ooHis buttock wounds o2 continue to look better ooLeft heel some debris over the surface but measuring smaller. ooRight dorsal foot unfortunately has openings between the toes ooLeft foot superficial wound looks less aggravated. 08/07/18 ooButtock wounds continue to look better although some of her granulation and the larger medial wound. silver alginate ooLeft heel continues to look a lot better.silver collagen ooLeft foot superficial wound looks less stable. Requires debridement. He has a new wound superficial area on the foot on the lateral dorsal foot. ooRight foot looks better using silver alginate without Lotrisone 08/14/2018; patient was in the ER last week diagnosed with a UTI. He is now on Cefpodoxime and Macrodantin. ooButtock wounds continued to be smaller. Using silver alginate ooLeft heel continues to look better using silver collagen ooLeft foot superficial wound looks as though it is improving ooRight dorsal foot area is just about healed. 08/21/2018; patient is completed his antibiotics for his UTI. ooHe has 2 open areas on the buttocks. There is still not closed although the surface looks satisfactory. Using silver alginate ooLeft heel continues to improve using silver collagen ooThe bilateral dorsal foot areas which are at the base  of his first and second toes/possible tinea pedis are actually stable on the left but worse on the right. The area on the left required debridement of necrotic surface. After debridement I obtained a specimen for PCR culture. ooThe right dorsal  foot which is been just about healed last week is now reopened 08/28/2018; culture done on the left dorsal foot showed coag negative staph both staph epidermidis and Lugdunensis. I think this is worthwhile initiating systemic treatment. I will use doxycycline given his long list of allergies. The area on the left heel slightly improved but still requiring debridement. ooThe large wound on the buttock is just about closed whereas the smaller one is larger. Using silver alginate in this area 09/04/2018; patient is completing his doxycycline for the left foot although this continues to be a very difficult wound area with very adherent necrotic debris. We are using silver alginate to all his wounds right foot left foot and the small wounds on his buttock, silver collagen on the left heel. 09/11/2018; once again this patient has intense erythema and swelling of the left forefoot. Lesser degrees of erythema in the right foot. He has a long list of allergies and intolerances. I will reinstitute doxycycline. 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 Using silver alginate ooLeft heel also looks better using silver collagen ooUnfortunately both the areas on his feet look worse. The area on the left first second webspace is now gone through to the plantar part of his foot. The area on the left foot anteriorly is irritated with erythema and swelling in the forefoot. 09/25/2018 ooHis wound on the left plantar heel looks better. Using silver collagen ooThe area on the left buttock 2 small remnant areas. One is closed one is still open. Using silver alginate ooThe areas between both his first and second toes look worse. This in spite of long-standing antifungal therapy with ketoconazole and silver alginate which should have antifungal activity ooHe has small areas around his original wound on the left calf one is on the bottom of the original scar tissue  and one superiorly both of these are small and superficial but again given wound history in this site this is worrisome 10/02/2018 ooLeft plantar heel continues to gradually contract using silver collagen ooLeft buttock wound is unchanged using silver alginate ooThe areas on his dorsal feet between his first and second toes bilaterally look about the same. 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 Small open area here. We have been using silver alginate 10/09/2018; ooLeft plantar heel continues to look healthy and contract. No debridement is required ooLeft buttock slightly smaller with a tape injury wound just below which was new this week ooDorsal feet somewhat improved I have been using clindamycin ooLeft lateral looks lower extremity the actual open area looks worse although a lot of this is epithelialized. I am going to change to silver collagen today He has a lot more swelling in the right leg although this is not pitting not red and not particularly warm there is a lot of spasm in the right leg usually indicative of people with paralysis of some underlying discomfort. We have reviewed his vascular status from 2017 he had a left greater saphenous vein ablation. I wonder about referring him back to vascular surgery  if the area on the left leg continues to deteriorate. 10/16/2018 in today for follow-up and management of multiple lower extremity ulcers. His left Buttock wound is much lower smaller and almost closed completely. The wound to the left ankle has began to reopen with Epithelialization and some adherent slough. He has multiple new areas to the left foot and leg. The left dorsal foot without much improvement. Wound present between left great webspace and 2nd toe. Erythema and edema present  right leg. Right LE ultrasound obtained on 10/10/18 was negative for DVT. 10/23/2018; ooLeft buttock is closed over. Still dry macerated skin but there is no open wound. I suspect this is chronic pressure/moisture ooLeft lateral calf is quite a bit worse than when I saw this last. There is clearly drainage here he has macerated skin into the left plantar heel. We will change the primary dressing to alginate ooLeft dorsal foot has some improvement in overall wound area. Still using clindamycin and silver alginate ooRight dorsal foot about the same as the left using clindamycin and silver alginate ooThe erythema in the right leg has resolved. He is DVT rule out was negative ooLeft heel pressure area required debridement although the wound is smaller and the surface is health 10/26/2018 ooThe patient came back in for his nurse check today predominantly because of the drainage coming out of the left lateral leg with a recent reopening of his original wound on the left lateral calf. He comes in today with a large amount of surrounding erythema around the wound extending from the calf into the ankle and even in the area on the dorsal foot. He is not systemically unwell. He is not febrile. Nevertheless this looks like cellulitis. We have been using silver alginate to the area. I changed him to a regular visit and I am going to prescribe him doxycycline. The rationale here is a long list of medication intolerances and a history of MRSA. I did not see anything that I thought would provide a valuable culture 10/30/2018 ooFollow-up from his appointment 4 days ago with really an extensive area of cellulitis in the left calf left lateral ankle and left dorsal foot. I put him on doxycycline. He has a long list of medication allergies which are true allergy reactions. 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 In any case he is a lot  better today. The erythema especially in the anterior and lateral left calf is better. He still has left ankle erythema. He also is complaining about increasing edema in the right leg we have only been using Kerlix Coban and he has been doing the wraps at home. Finally he has a spotty rash on the medial part of his upper left calf which looks like folliculitis or perhaps wrap occlusion type injury. Small superficial macules not pustules 11/06/18 patient arrives today with again a considerable degree of erythema around the wound on the left lateral calf extending into the dorsal ankle and dorsal foot. This is a lot worse than when I saw this last week. He is on doxycycline really with not a lot of improvement. He has not been systemically unwell Wounds on the; left heel actually looks improved. Original area on the left foot and proximity to the first and second toes looks about the same. He has superficial areas on the dorsal foot, anterior calf and then the reopening of his original wound on the left lateral calf which looks about  the same ooThe only area he has on the right is the dorsal webspace first and second which is smaller. ooHe has a large area of dry erythematous skin on the left buttock small open area here. 11/13/2018; the patient arrives in much better condition. The erythema around the wound on the left lateral calf is a lot better. Not sure whether this was the clindamycin or the TCA and ketoconazole or just in the improvement in edema control [stasis dermatitis]. In any case this is a lot better. The area on the left heel is very small and just about resolved using silver collagen we have been using silver alginate to the areas on his dorsal feet 11/20/2018; his wounds include the left lateral calf, left heel, dorsal aspects of both feet just proximal to the first second webspace. He is stable to slightly improved. I did not think any changes to his dressings were going to  be necessary 11/27/2018 he has a reopening on the left buttock which is surrounded by what looks like tinea or perhaps some other form of dermatitis. The area on the left dorsal foot has some erythema around it I have marked this area but I am not sure whether this is cellulitis or not. Left heel is not closed. Left calf the reopening is really slightly longer and probably worse 1/13; in general things look better and smaller except for the left dorsal foot. Area on the left heel is just about closed, left buttock looks better only a small wound remains in the skin looks better [using Lotrisone] 1/20; the area on the left heel only has a few remaining open areas here. Left lateral calf about the same in terms of size, left dorsal foot slightly larger right lateral foot still not closed. The area on the left buttock has no open wound and the surrounding skin looks a lot better 1/27; the area on the left heel is closed. Left lateral calf better but still requiring extensive debridements. The area on his left buttock is closed. He still has the open areas on the left dorsal foot which is slightly smaller in the right foot which is slightly expanded. We have been using Iodoflex on these areas as well 2/3; left heel is closed. Left lateral calf still requiring debridement using Iodoflex there is no open area on his left buttock however he has dry scaly skin over a large area of this. Not really responding well to the Lotrisone. Finally the areas on his dorsal feet at the level of the first second webspace are slightly smaller on the right and about the same on the left. Both of these vigorously debrided with Anasept and gauze 2/10; left heel remains closed he has dry erythematous skin over the left buttock but there is no open wound here. Left lateral leg has come in and with. Still requiring debridement we have been using Iodoflex here. Finally the area on the left dorsal foot and right dorsal foot are  really about the same extremely dry callused fissured areas. He does not yet have a dermatology appointment 2/17; left heel remains closed. He has a new open area on the left buttock. The area on the left lateral calf is bigger longer and still covered in necrotic debris. No major change in his foot areas bilaterally. I am awaiting for a dermatologist to look on this. We have been using ketoconazole I do not know that this is been doing any good at all. 2/24; left heel remains closed.  The left buttock wound that was new reopening last week looks better. The left lateral calf appears better also although still requires debridement. The major area on his foot is the left first second also requiring debridement. We have been putting Prisma on all wounds. I do not believe that the ketoconazole has done too much good for his feet. He will use Lotrisone I am going to give him a 2-week course of terbinafine. We still do not have a dermatology appointment 3/2 left heel remains closed however there is skin over bone in this area I pointed this out to him today. The left buttock wound is epithelialized but still does not look completely stable. The area on the left leg required debridement were using silver collagen here. With regards to his feet we changed to Lotrisone last week and silver alginate. 3/9; left heel remains closed. Left buttock remains closed. The area on the right foot is essentially closed. The left foot remains unchanged. Slightly smaller on the left lateral calf. Using silver collagen to both of these areas 3/16-Left heel remains closed. Area on right foot is closed. Left lateral calf above the lateral malleolus open wound requiring debridement with easy bleeding. Left dorsal wound proximal to first toe also debrided. Left ischial area open new. Patient has been using Prisma with wrapping every 3 days. Dermatology appointment is apparently tomorrow.Patient has completed his terbinafine  2-week course with some apparent improvement according to him, there is still flaking and dry skin in his foot on the left 3/23; area on the right foot is reopened. The area on the left anterior foot is about the same still a very necrotic adherent surface. He still has the area on the left leg and reopening is on the left buttock. He apparently saw dermatology although I do not have a note. According to the patient who is usually fairly well informed they did not have any good ideas. Put him on oral terbinafine which she is been on before. 3/30; using silver collagen to all wounds. Apparently his dermatologist put him on doxycycline and rifampin presumably some culture grew staph. I do not have this result. He remains on terbinafine although I have used terbinafine on him before 4/6; patient has had a fairly substantial reopening on the right foot between the first and second toes. He is finished his terbinafine and I believe is on doxycycline and rifampin still as prescribed by dermatology. We have been using silver collagen to all his wounds although the patient reports that he thinks silver alginate does better on the wounds on his buttock. 4/13; the area on his left lateral calf about the same size but it did not require debridement. ooLeft dorsal foot just proximal to the webspace between the first and second toes is about the same. Still nonviable surface. I note some superficial bronze discoloration of the dorsal part of his foot ooRight dorsal foot just proximal to the first and second toes also looks about the same. I still think there may be the same discoloration I noted above on the left ooLeft buttock wound looks about the same 4/20; left lateral calf appears to be gradually contracting using silver collagen. 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 The left dorsal foot wound is debrided of tightly adherent necrotic  debris and really cleans up quite nicely. The right area is worse with expansion. I did not debride this it is now over the base of the second toe   ooThe area on his left buttock is smaller no debridement is required using silver collagen 5/4; left calf continues to make good progress. ooHe arrives with erythema around the wounds on his dorsal foot which even extends to the plantar aspect. Very concerning for coexistent infection. He is finished the erythromycin I gave him for possible erythrasma this does not seem to have helped. ooThe area on the left foot is about the same base of the dorsal toes ooIs area on the buttock looks improved on the left 5/11; left calf and left buttock continued to make good progress. Left foot is about the same to slightly improved. ooMajor problem is on the right foot. He has not had an x-ray. Deep tissue culture I did last week showed both Enterobacter and E. coli. I did not change the doxycycline I put him on empirically although neither 1 of these were plated to doxycycline. He arrives today with the erythema looking worse on both the dorsal and plantar foot. Macerated skin on the bottom of the foot. he has not been systemically unwell 5/18-Patient returns at 1 week, left calf wound appears to be making some progress, left buttock wound appears slightly worse than last time, left foot wound looks slightly better, right foot redness is marginally better. X-ray of both feet show no air or evidence of osteomyelitis. Patient is finished his Omnicef and terbinafine. He continues to have macerated skin on the bottom of the left foot as well as right 5/26; left calf wound is better, left buttock wound appears to have multiple small superficial open areas with surrounding macerated skin. X-rays that I did last time showed no evidence of osteomyelitis in either foot. He is finished cefdinir and doxycycline. I do not think that he was on terbinafine. He continues to  have a large superficial open area on the right foot anterior dorsal and slightly between the first and second toes. I did send him to dermatology 2 months ago or so wondering about whether they would do a fungal scraping. I do not believe they did but did do a culture. We have been using silver alginate to the toe areas, he has been using antifungals at home topically either ketoconazole or Lotrisone. We are using silver collagen on the left foot, silver alginate on the right, silver collagen on the left lateral leg and silver alginate on the left buttock 6/1; left buttock area is healed. We have the left dorsal foot, left lateral leg and right dorsal foot. We are using silver alginate to the areas on both feet and silver collagen to the area on his left lateral calf 6/8; the left buttock apparently reopened late last week. He is not really sure how this happened. He is tolerating the terbinafine. Using silver alginate to all wounds 6/15; left buttock wound is larger than last week but still superficial. ooCame in the clinic today with a report of purulence from the left lateral leg I did not identify any infection ooBoth areas on his dorsal feet appear to be better. He is tolerating the terbinafine. Using silver alginate to all wounds 6/22; left buttock is about the same this week, left calf quite a bit better. His left foot is about the same however he comes in with erythema and warmth in the right forefoot once again. Culture that I gave him in the beginning of May showed Enterobacter and E. coli. I gave him doxycycline and things seem to improve although neither 1 of these organisms was specifically  plated. 6/29; left buttock is larger and dry this week. Left lateral calf looks to me to be improved. Left dorsal foot also somewhat improved right foot completely unchanged. The erythema on the right foot is still present. He is completing the Ceftin dinner that I gave him empirically [see  discussion above.) 7/6 - All wounds look to be stable and perhaps improved, the left buttock wound is slightly smaller, per patient bleeds easily, completed ceftin, the right foot redness is less, he is on terbinafine 7/13; left buttock wound about the same perhaps slightly narrower. Area on the left lateral leg continues to narrow. Left dorsal foot slightly smaller right foot about the same. We are using silver alginate on the right foot and Hydrofera Blue to the areas on the left. Unna boot on the left 2 layer compression on the right 7/20; left buttock wound absolutely the same. Area on lateral leg continues to get better. Left dorsal foot require debridement as did the right no major change in the 7/27; left buttock wound the same size necrotic debris over the surface. The area on the lateral leg is closed once again. His left foot looks better right foot about the same although there is some involvement now of the posterior first second toe area. He is still on terbinafine which I have given him for a month, not certain a centimeter major change 06/25/19-All wounds appear to be slightly improved according to report, left buttock wound looks clean, both foot wounds have minimal to no debris the right dorsal foot has minimal slough. We are using Hydrofera Blue to the left and silver alginate to the right foot and ischial wound. 8/10-Wounds all appear to be around the same, the right forefoot distal part has some redness which was not there before, however the wound looks clean and small. Ischial wound looks about the same with no changes 8/17; his wound on the left lateral calf which was his original chronic venous insufficiency wound remains closed. Since I last saw him the areas on the left dorsal foot right dorsal foot generally appear better but require debridement. The area on his left initial tuberosity appears somewhat larger to me perhaps hyper granulated and bleeds very easily. We have  been using Hydrofera Blue to the left dorsal foot and silver alginate to everything else 8/24; left lateral calf remains closed. The areas on his dorsal feet on the webspace of the first and second toes bilaterally both look better. The area on the left buttock which is the pressure ulcer stage II slightly smaller. I change the dressing to Hydrofera Blue to all areas 8/31; left lateral calf remains closed. The area on his dorsal feet bilaterally look better. Using Hydrofera Blue. Still requiring debridement on the left foot. No change in the left buttock pressure ulcers however 9/14; left lateral calf remains closed. Dorsal feet look quite a bit better than 2 weeks ago. Flaking dry skin also a lot better with the ammonium lactate I gave him 2 weeks ago. The area on the left buttock is improved. He states that his Roho cushion developed a leak and he is getting a new one, in the interim he is offloading this vigorously 9/21; left calf remains closed. Left heel which was a possible DTI looks better this week. He had macerated tissue around the left dorsal foot right foot looks satisfactory and improved left buttock wound. I changed his dressings to his feet to silver alginate bilaterally. Continuing Hydrofera Blue on the left  buttock. 9/28 left calf remains closed. Left heel did not develop anything [possible DTI] dry flaking skin on the left dorsal foot. Right foot looks satisfactory. Improved left buttock wound. We are using silver alginate on his feet Hydrofera Blue on the buttock. I have asked him to go back to the Lotrisone on his feet including the wounds and surrounding areas 10/5; left calf remains closed. The areas on the left and right feet about the same. A lot of this is epithelialized however debris over the remaining open areas. He is using Lotrisone and silver alginate. The area on the left buttock using Hydrofera Blue 10/26. Patient has been out for 3 weeks secondary to Covid  concerns. He tested negative but I think his wife tested positive. He comes in today with the left foot substantially worse, right foot about the same. Even more concerning he states that the area on his left buttock closed over but then reopened and is considerably deeper in one aspect than it was before [stage III wound] 11/2; left foot really about the same as last week. Quarter sized wound on the dorsal foot just proximal to the first second toes. Surrounding erythema with areas of denuded epithelium. This is not really much different looking. Did not look like cellulitis this time however. ooRight foot area about the same.. We have been using silver alginate alginate on his toes ooLeft buttock still substantial irritated skin around the wound which I think looks somewhat better. We have been using Hydrofera Blue here. 11/9; left foot larger than last week and a very necrotic surface. Right foot I think is about the same perhaps slightly smaller. Debris around the circumference also addressed. Unfortunately on the left buttock there is been a decline. Satellite lesions below the major wound distally and now a an additional one posteriorly we have been using Hydrofera Blue but I think this is a pressure issue 11/16; left foot ulcer dorsally again a very adherent necrotic surface. Right foot is about the same. Not much change in the pressure ulcer on his left buttock. 11/30; left foot ulcer dorsally basically the same as when I saw him 2 weeks ago. Very adherent fibrinous debris on the wound surface. Patient reports a lot of drainage as well. 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 We have been using Iodoflex, patient changed back to alginate because of the drainage. Area on his right dorsal foot really looks benign with a healthier surface certainly a lot better than on the left. Left buttock wounds all improved using Hydrofera Blue 12/7;  left dorsal foot again no improvement. Tightly adherent debris. PCR culture I did last week only showed likely skin contaminant. I have gone ahead and done a punch biopsy of this which is about the last thing in terms of investigations I can think to do. He has known venous insufficiency and venous hypertension and this could be the issue here. The area on the right foot is about the same left buttock slightly worse according to our intake nurse secondary to [facility] Blue sticking to the wound 12/14; biopsy of the left foot that I did last time showed changes that could be related to wound healing/chronic stasis dermatitis phenomenon no neoplasm. We have been using silver alginate to both feet. I change the one on the left today to Sorbact and silver alginate to his other 2 wounds 12/28; the patient arrives with the following problems; ooMajor issue is the dorsal left  foot which continues to be a larger deeper wound area. Still with a completely nonviable surface ooParadoxically the area mirror image on the right on the right dorsal foot appears to be getting better. ooHe had some loss of dry denuded skin from the lower part of his original wound on the left lateral calf. Some of this area looked a little vulnerable and for this reason we put him in wrap that on this side this week ooThe area on his left buttock is larger. He still has the erythematous circular area which I think is a combination of pressure, sweat. This does not look like cellulitis or fungal dermatitis 11/26/2019; -Dorsal left foot large open wound with depth. Still debris over the surface. Using Sorbact ooThe area on the dorsal right foot paradoxically has closed over [facility] has a reopening on the left ankle laterally at the base of his original wound that extended up into the calf. This appears clean. ooThe left buttock wound is smaller but with very adherent necrotic debris over the surface. We have been using silver  alginate here as well The patient had arterial studies done in 2017. He had biphasic waveforms at the dorsalis pedis and posterior tibial bilaterally. ABI in the left was 1.17. Digit waveforms were dampened. He has slight spasticity in the great toes I do not think a TBI would be possible 1/11; the patient comes in today with a sizable reopening between the first and second toes on the right. This is not exactly in the same location where we have been treating wounds previously. According to our intake nurse this was actually fairly deep but 0.6 cm. The area on the left dorsal foot looks about the same the surface is somewhat cleaner using Sorbact, his MRI is in 2 days. We have not managed yet to get arterial studies. The new reopening on the left lateral calf looks somewhat better using alginate. The left buttock wound is about the same using alginate 1/18; the patient had his ARTERIAL studies which were quite normal. ABI in the right at 1.13 with triphasic/biphasic waveforms on the left ABI 1.06 again with triphasic/biphasic waveforms. It would not have been possible to have done a toe brachial index because of spasticity. We have been using Sorbac to the left foot alginate to the rest of his wounds on the right foot left lateral calf and left buttock 1/25; arrives in clinic with erythema and swelling of the left forefoot worse over the first MTP area. This extends laterally dorsally and but also posteriorly. Still has an area on the left lateral part of the lower part of his calf wound it is eschared and clearly not closed. ooArea on the left buttock still with surrounding irritation and erythema. ooRight foot surface wound dorsally. The area between the right and first and second toes appears better. 2/1; ooThe left foot wound is about the same. Erythema slightly better I gave him a week of doxycycline empirically ooRight foot wound is more extensive extending between the toes to the  plantar surface ooLeft lateral calf really no open surface on the inferior part of his original wound however the entire area still looks vulnerable ooAbsolutely no improvement in the left buttock wound required debridement. 2/8; the left foot is about the same. Erythema is slightly improved I gave him clindamycin last week. ooRight foot looks better he is using Lotrimin and silver alginate ooHe has a breakdown in the left lateral calf. Denuded epithelium which I have removed ooLeft buttock  about the same were using Hydrofera Blue 2/15; left foot is about the same there is less surrounding erythema. Surface still has tightly adherent debris which I have debriding however not making any progress ooRight foot has a substantial wound on the medial right second toe between the first and second webspace. ooStill an open area on the left lateral calf distal area. ooButtock wound is about the same 2/22; left foot is about the same less surrounding erythema. Surface has adherent debris. Polymen Ag Right foot area significant wound between the first and second toes. We have been using silver alginate here Left lateral leg polymen Ag at the base of his original venous insufficiency wound ooLeft buttock some improvement here 3/1; ooRight foot is deteriorating in the first second toe webspace. Larger and more substantial. We have been using silver alginate. ooLeft dorsal foot about the same markedly adherent surface debris using PolyMem Ag ooLeft lateral calf surface debris using PolyMem AG ooLeft buttock is improved again using [employer]. ooHe is completing his terbinafine. The erythema in the foot seems better. He has been on this for 2 weeks 3/8; no improvement in any wound area in fact he has a small open area on the dorsal midfoot which is new this week. He has not gotten his foot x-rays yet 3/15; his x-rays were both negative for osteomyelitis of both feet. No major change in any  of his wounds on the extremities however his buttock wounds are better. We have been using polymen on the buttocks, left lower leg. Iodoflex on the left foot and silver alginate on the right Objective Constitutional Sitting or standing Blood Pressure is within target range for patient.. Pulse regular and within target range for patient.Marland Kitchen Respirations regular, non-labored and within target range.. Temperature is normal and within the target range for the patient.Marland Kitchen Appears in no distress. Vitals Time Taken: 7:43 AM, Height: 70 in, Weight: 216 lbs, BMI: 31, Temperature: 97.6 F, Pulse: 113 bpm, Respiratory Rate: 20 breaths/min, Blood Pressure: 123/74 mmHg. General Notes: Wound exam; debridement of the left lateral ankle. Very adherent necrotic debris under illumination debrided with a #5 curette hemostasis with direct pressure. I did not debride the left foot although there is debris on this currently. The right medial second toe looks somewhat better I did not debride this today. Left buttock inferior wound better superior wound divided into 2 also better Integumentary (Hair, Skin) Wound #24 status is Open. Original cause of wound was Gradually Appeared. The wound is located on the Left,Dorsal Foot. The wound measures 2.9cm length x 2.5cm width x 0.7cm depth; 5.694cm^2 area and 3.986cm^3 volume. There is Fat Layer (Subcutaneous Tissue) Exposed exposed. There is no tunneling or undermining noted. There is a medium amount of serosanguineous drainage noted. The wound margin is distinct with the outline attached to the wound base. There is small (1-33%) red granulation within the wound bed. There is a large (67- 100%) amount of necrotic tissue within the wound bed including Adherent Slough. Wound #35 status is Open. Original cause of wound was Gradually Appeared. The wound is located on the Left Ischium. The wound measures 2.2cm length x 1.2cm width x 0.1cm depth; 2.073cm^2 area and 0.207cm^3  volume. There is Fat Layer (Subcutaneous Tissue) Exposed exposed. There is no tunneling or undermining noted. There is a medium amount of serosanguineous drainage noted. The wound margin is flat and intact. There is large (67-100%) red, friable granulation within the wound bed. There is no necrotic tissue within the wound  bed. Wound #37 status is Open. Original cause of wound was Gradually Appeared. The wound is located on the Left,Lateral Malleolus. The wound measures 3.3cm length x 1.8cm width x 0.1cm depth; 4.665cm^2 area and 0.467cm^3 volume. There is Fat Layer (Subcutaneous Tissue) Exposed exposed. There is no tunneling or undermining noted. There is a medium amount of serosanguineous drainage noted. The wound margin is flat and intact. There is small (1-33%) pink granulation within the wound bed. There is a large (67-100%) amount of necrotic tissue within the wound bed including Adherent Slough. Wound #38 status is Open. Original cause of wound was Gradually Appeared. The wound is located on the Right Toe - Web between 1st and 2nd. The wound measures 3cm length x 2cm width x 0.2cm depth; 4.712cm^2 area and 0.942cm^3 volume. There is Fat Layer (Subcutaneous Tissue) Exposed exposed. There is no tunneling or undermining noted. There is a medium amount of serosanguineous drainage noted. The wound margin is flat and intact. There is medium (34-66%) red granulation within the wound bed. There is a medium (34-66%) amount of necrotic tissue within the wound bed including Adherent Slough. Wound #39 status is Healed - Epithelialized. Original cause of wound was Gradually Appeared. The wound is located on the Left,Proximal,Dorsal Foot. The wound measures 0cm length x 0cm width x 0cm depth; 0cm^2 area and 0cm^3 volume. 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 There was a Excisional Skin/Subcutaneous Tissue Debridement with a total area of 5.94 sq cm performed by Ricard Dillon., MD. With the following instrument(s): Curette to remove Viable and Non-Viable tissue/material. Material removed includes Subcutaneous Tissue. No specimens were taken. A time out was conducted at 08:06, prior to the start of the procedure. A Minimum amount of bleeding was controlled with Pressure. The procedure was tolerated well with a pain level of 0 throughout and a pain level of 0 following the procedure. Post Debridement Measurements: 3.3cm length x 1.8cm width x 0.1cm depth; 0.467cm^3 volume. Character of Wound/Ulcer Post Debridement is improved. Severity of Tissue Post Debridement is: Fat layer exposed. 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 There was a Four Layer Compression Therapy Procedure by Levan Hurst, RN. Post procedure Diagnosis Wound #37: Same as Pre-Procedure Plan Follow-up Appointments: Return Appointment in 1 week. Dressing Change Frequency: Wound #24 Left,Dorsal Foot: Do not change entire dressing for one week. Wound #35 Left Ischium: Change Dressing every other day. Wound #37 Left,Lateral Malleolus: Do not change entire dressing for one week. Wound #38 Right Toe - Web between 1st and 2nd: Change Dressing every other day. Skin Barriers/Peri-Wound Care: Antifungal cream - on toes on both feet daily Moisturizing lotion - both legs Other: - Triamcinolone cream Wound #24  Left,Dorsal Foot: Barrier cream Moisturizing lotion - to both legs and feet Primary Wound Dressing: Wound #24 Left,Dorsal Foot: Iodoflex Wound #35 Left Ischium: Polymem Silver Wound #37 Left,Lateral Malleolus: Polymem Silver Wound #38 Right Toe - Web between 1st and 2nd: Calcium  Alginate with Silver Secondary Dressing: Wound #24 Left,Dorsal Foot: Dry Gauze Heel Cup - add heel cup to left heel for protection. Wound #35 Left Ischium: Foam Border - or ABD pad and tape Wound #37 Left,Lateral Malleolus: Dry Gauze Wound #38 Right Toe - Web between 1st and 2nd: Kerlix/Rolled Gauze - secure with tape Dry Gauze Edema Control: 4 layer compression: Left lower extremity Elevate legs to the level of the heart or above for 30 minutes daily and/or when sitting, a frequency of: - throughout the day Support Garment 30-40 mm/Hg pressure to: - Juxtalite to right leg Off-Loading: Low air-loss mattress (Group 2) Roho cushion for wheelchair Turn and reposition every 2 hours - out of wheelchair throughout the day, try to lay on sides, sleep in the bed not the recliner 1. I have continued with polymen to the buttock and the left lateral lower leg 2. Still silver alginate between the right first and second toes 3. Still Iodoflex to the dorsal left foot at the base of the first toe. 4. Still looking for a vascular appointment with regards to his venous reflux. 5. His x-rays were normal i.e. no osteo- Electronic Signature(s) Signed: 02/05/2020 10:32:20 AM By: Linton Ham MD Entered By: Linton Ham on 02/04/2020 08:15:14 -------------------------------------------------------------------------------- SuperBill Details Patient Name: Date of Service: Castrogiovanni, [name] 02/04/2020 Medical Record [id number] Patient Account Number: [id number] Date of Birth/Sex: Treating RN: [birth year] (34 y.o. [name] Primary Care Provider: [place], [place] Other Clinician: Referring Provider:  Treating Provider/Extender:Shaivi Rothschild, [name], [name]: 213 Diagnosis Coding ICD-10 Codes Code Description L97.521 Non-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 L97.511 Non-pressure chronic ulcer of other part of right foot limited to breakdown of skin 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, complete 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, stage 3 L97.321 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 to breakdown of skin L03.116 Cellulitis of left lower limb Facility Procedures CPT4 Code Description: [id number] - DEB SUBQ TISSUE 20 SQ CM/< ICD-10 Diagnosis Description L97.321 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 to breakd Modifier: own of skin Quantity: 1 Physician Procedures CPT4 Code Description: [id number] - WC PHYS SUBQ TISS 20 SQ CM ICD-10 Diagnosis Description L97.321 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 to breakdow Modifier: n of skin Quantity: 1 Electronic Signature(s) Signed: 02/05/2020 10:32:20 AM By: Linton Ham MD Entered By: Linton Ham on 02/04/2020 08:15:49

## 2020-02-11 ENCOUNTER — Other Ambulatory Visit (HOSPITAL_COMMUNITY)
Admission: RE | Admit: 2020-02-11 | Discharge: 2020-02-11 | Disposition: A | Discharge status: Home / Self Care | Payer: BC Managed Care – PPO | Source: Other Acute Inpatient Hospital | Attending: Internal Medicine | Admitting: Internal Medicine

## 2020-02-11 ENCOUNTER — Encounter (HOSPITAL_BASED_OUTPATIENT_CLINIC_OR_DEPARTMENT_OTHER): Payer: BC Managed Care – PPO | Admitting: Internal Medicine

## 2020-02-11 ENCOUNTER — Other Ambulatory Visit: Payer: Self-pay

## 2020-02-11 DIAGNOSIS — Z882 Allergy status to sulfonamides status: Secondary | ICD-10-CM | POA: Diagnosis not present

## 2020-02-11 DIAGNOSIS — Z881 Allergy status to other antibiotic agents status: Secondary | ICD-10-CM | POA: Diagnosis not present

## 2020-02-11 DIAGNOSIS — L89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, stage 3: Secondary | ICD-10-CM | POA: Diagnosis not present

## 2020-02-11 DIAGNOSIS — L97321 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 to breakdown of skin: Secondary | ICD-10-CM | POA: Diagnosis not present

## 2020-02-11 DIAGNOSIS — L03116 Cellulitis of left lower limb: Secondary | ICD-10-CM | POA: Diagnosis not present

## 2020-02-11 DIAGNOSIS — Z87891 Personal history of nicotine dependence: Secondary | ICD-10-CM | POA: Diagnosis not present

## 2020-02-11 DIAGNOSIS — I872 Venous insufficiency (chronic) (peripheral): Secondary | ICD-10-CM | POA: Diagnosis not present

## 2020-02-11 DIAGNOSIS — L97521 Non-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: Secondary | ICD-10-CM | POA: Diagnosis not present

## 2020-02-11 DIAGNOSIS — Z880 Allergy status to penicillin: Secondary | ICD-10-CM | POA: Diagnosis not present

## 2020-02-11 DIAGNOSIS — G8221 Paraplegia, complete: Secondary | ICD-10-CM | POA: Diagnosis not present

## 2020-02-11 DIAGNOSIS — L97322 Non-pressure chronic ulcer of left 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: Secondary | ICD-10-CM | POA: Diagnosis not present

## 2020-02-11 DIAGNOSIS — L97511 Non-pressure chronic ulcer of other part of right foot limited to breakdown of skin: Secondary | ICD-10-CM | POA: Diagnosis not present

## 2020-02-11 DIAGNOSIS — Z89422 Acquired absence of other left toe(s): Secondary | ICD-10-CM | POA: Diagnosis not present

## 2020-02-11 NOTE — Progress Notes (Signed)
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 adherent surface. High switch to silver collagen a week or so ago with hydrogel to keep the area moist. This is been refractory to multiple dressing attempts. He also has areas in his first and second toes bilaterally in the anterior and posterior web space. I had been using Iodoflex here after a prolonged course of silver alginate with ketoconazole was ineffective [question tinea pedis] 07/14/17; patient arrives today with a very difficult adherent material over his left lateral lower leg wound. He also has surrounding erythema and poorly controlled edema. He was switched his Santyl last visit which the nurses are applying once during his doctor visit and once on a nurse visit. He was also reduced to 2 layer compression I'm not exactly sure of the issue here. 07/21/17; better surface today after 1 week of Iodoflex. Significant cellulitis that we treated last week also better. [Doxycycline] 07/28/17 better surface today with now 2 weeks of Iodoflex. Significant cellulitis treated with doxycycline. He has now  completed the doxycycline and he is back to his usual degree of chronic venous inflammation/stasis dermatitis. He reminds me he has had  ablations surgery here 08/04/17; continued improvement with Iodoflex to the left lateral leg wound in terms of the surface of the wound although the dimensions are better. 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 Problematic areas on the plantar aspect of the first second toe web space on the left and the dorsal aspect of the first second toe web space on the right. At one point I felt these were probably related to chronic fungal infections in treated him aggressively for this although we have not made any improvement here. 08/11/17; left lateral leg. Surface continues to improve with the Iodoflex although we are not seeing much improvement in overall wound dimensions. Areas on his plantar left foot and right foot show no improvement. In fact the right foot looks somewhat worse 08/18/17; left lateral leg. We changed to [facility] Blue last week after a prolonged course of Iodoflex which helps get the surface better. It appears that the wound with is improved. Continue with difficult areas on the left dorsal first second and plantar first second on the right 09/01/17; patient arrives in clinic today having had a temperature of 103 yesterday. He was seen in the ER and [facility]. The patient was concerned he could have cellulitis again in the right leg however they diagnosed him with a UTI and he is now on Keflex. He has a history of cellulitis which is been recurrent and difficult but this is been in the left leg, in the past 5 use doxycycline. He does in and out catheterizations at home which are risk factors for UTI 09/08/17; patient will be completing his Keflex this weekend. The erythema on the left leg is considerably better. He has a new wound today on the medial part of the right leg small superficial almost looks like a skin tear. He has worsening of the area on the right dorsal first and second toe. His major area on the  left lateral leg is better. Using Hydrofera Blue on all areas 09/15/17; gradual reduction in width on the long wound in the left lateral leg. No debridement required. He also has wounds on the plantar aspect of his left first second toe web space and on the dorsal aspect of the right first second toe web space. 09/22/17; there continues to be very gradual improvements in the dimensions of the left lateral leg wound. He hasn't round erythematous spot with might be pressure on his wheelchair. There is no evidence obviously of infection no purulence no warmth He has a dry scaled area on the plantar aspect of the left first second toe Improved area on the dorsal right first second toe. 09/29/17; left lateral leg wound continues to improve in dimensions mostly with an is still a fairly long but increasingly narrow wound. He has a dry scaled area on the plantar aspect of his left first second toe web space Increasingly concerning area on the dorsal right first second toe. In fact I am concerned today about possible cellulitis around this wound. The areas extending up his second toe and although there is deformities here almost appears to abut on the nailbed. 10/06/17; left lateral leg wound continues to make very gradual progress. Tissue culture I did from the right first second toe dorsal foot last time grew MRSA and enterococcus which was vancomycin sensitive. This was  not sensitive to clindamycin or doxycycline. He is allergic to Zyvox and sulfa we have therefore arrange for him to have dalvance infusion tomorrow. He is had this in the past and tolerated it well 10/20/17; left lateral leg wound continues to make decent progress. This is certainly reduced in terms of with there is advancing epithelialization.The cellulitis in the right foot looks better although he still has a deep wound in the dorsal aspect of the first second toe web space. Plantar left first toe web space on the left I think is  making some progress 10/27/17; left lateral leg wound continues to make decent progress. Advancing epithelialization.using Hydrofera Blue The right first second toe web space wound is better-looking using silver alginate Improvement in the left plantar first second toe web space. Again using silver alginate 11/03/17 left lateral leg wound continues to make decent progress albeit slowly. Using [facility] The right per second toe web space continues to be a very problematic looking punched out wound. I obtained a piece of tissue for deep culture I did extensively treated this for fungus. It is difficult to imagine that this is a pressure area as the patient states other than going outside he doesn't really wear shoes at home The left plantar first second toe web space looked fairly senescent. Necrotic edges. This required debridement change to [facility] Blue to all wound areas 11/10/17; left lateral leg wound continues to contract. Using Hydrofera Blue On the right dorsal first second toe web space dorsally. Culture I did of this area last week grew MRSA there is not an easy oral option in this patient was multiple antibiotic allergies or intolerances. This was only a rare culture isolate I'm therefore going to use Bactroban under silver alginate On the left plantar first second toe web space. Debridement is required here. This is also unchanged 11/17/17; left lateral leg wound continues to contract using Hydrofera Blue this is no longer the major issue. The major concern here is the right first second toe web space. He now has an open area going from dorsally to the plantar aspect. There is now wound on the inner lateral part of the first toe. Not a very viable surface on this. There is erythema spreading medially into the forefoot. No major change in the left first second toe plantar wound 11/24/17; left lateral leg wound continues to contract using Hydrofera Blue. Nice improvement today The  right first second toe web space all of this looks a lot less angry than last week. I have given him clindamycin and topical Bactroban for MRSA and terbinafine for the possibility of underlining tinea pedis that I could not control with ketoconazole. Looks somewhat better The area on the plantar left first second toe web space is weeping with dried debris around the wound 12/01/17; left lateral leg wound continues to contract he Hydrofera Blue. It is becoming thinner in terms of with nevertheless it is making good improvement. The right first second toe web space looks less angry but still a large necrotic-looking wounds starting on the plantar aspect of the right foot extending between the toes and now extensively on the base of the right second toe. I gave him clindamycin and topical Bactroban for MRSA anterior benefiting for the possibility of underlying tinea pedis. Not looking better today The area on the left first/second toe looks better. Debrided of necrotic debris 12/05/17* the patient was worked in urgently today because over the weekend he found blood on his incontinence bad when  he woke up. He was found to have an ulcer by his wife who does most of his wound care. He came in today for [place] to look at this. He has not had a history of wounds in his buttocks in spite of his paraplegia. 12/08/17; seen in follow-up today at his usual appointment. He was seen earlier this week and found to have a new wound on his buttock. We also follow him for wounds on the left lateral leg, left first second toe web space and right first second toe web space 12/15/17; we have been using Hydrofera Blue to the left lateral leg which has improved. The right first second toe web space has also improved. Left first second toe web space plantar aspect looks stable. The left buttock has worsened using Santyl. Apparently the buttock has drainage 12/22/17; we have been using Hydrofera Blue to the left lateral leg which  continues to improve now 2 small wounds separated by normal skin. He tells [place] he had a fever up to 100 yesterday he is prone to UTIs but has not noted anything different. He does in and out catheterizations. The area between the first and second toes today does not look good necrotic surface covered with what looks to be purulent drainage and erythema extending into the third toe. I had gotten this to something that I thought look better last time however it is not look good today. He also has a necrotic surface over the buttock wound which is expanded. I thought there might be infection under here so I removed a lot of the surface with a #5 curet though nothing look like it really needed culturing. He is been using Santyl to this area 12/27/17; his original wound on the left lateral leg continues to improve using Hydrofera Blue. I gave him samples of Baxdella although he was unable to take them out of fear for an allergic reaction ["lump in his throat"].the culture I did of the purulent drainage from his second toe last week showed both enterococcus and a set Enterobacter I was also concerned about the erythema on the bottom of his foot although paradoxically although this looks somewhat better today. 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 We've been using silver alginate here. They came up today that he sleeps in a recliner, I'm not sure why but I asked him to stop this 01/03/18; his original wound we've been using Hydrofera Blue is now separated into 2 areas. Ulcer on his left buttock is better he is off the recliner and sleeping in bed Finally both wound areas between his first and second toes also looks some better 01/10/18; his original wound on the left lateral leg is now separated into 2 wounds we've been using Hydrofera Blue Ulcer on his left buttock has some drainage. There is a small probing  site going into muscle layer superiorly.using silver alginate -He arrives today with a deep tissue injury on the left heel The wound on the dorsal aspect of his first second toe on the left looks a lot betterusing silver alginate ketoconazole The area on the first second toe web space on the right also looks a lot bette 01/17/18; his original wound on the left lateral leg continues to progress using Hydrofera Blue Ulcer on his left buttock also is smaller surface healthier except for a small probing site going into the muscle layer superiorly. 2.4 cm of  tunneling in this area DTI on his left heel we have only been offloading. Looks better than last week no threatened open no evidence of infection the wound on the dorsal aspect of the first second toe on the left continues to look like it's regressing we have only been using silver alginate and terbinafine orally The area in the first second toe web space on the right also looks to be a lot better using silver alginate and terbinafine I think this was prompted by tinea pedis 01/31/18; the patient was hospitalized in [place] last week apparently for a complicated UTI. He was discharged on cefepime he does in and out catheterizations. In the hospital he was discovered M I don't mild elevation of AST and ALTs and the terbinafine was stopped.predictably the pressure ulcer on his buttock looks betterusing silver alginate. The area on the left lateral leg also is better using Hydrofera Blue. The area between the first and second toes on the left better. First and second toes on the right still substantial but better. Finally the DTI on the left heel has held together and looks like it's resolving 02/07/18-he is here in follow-up evaluation for multiple ulcerations. He has new injury to the lateral aspect of the last issue a pressure ulcer, he states this is from adhesive removal trauma. He states he has tried multiple adhesive products with no success.  All other ulcers appear stable. The left heel DTI is resolving. We will continue with same treatment plan and follow-up next week. 02/14/18; follow-up for multiple areas. He has a new area last week on the lateral aspect of his pressure ulcer more over the posterior trochanter. The original pressure ulcer looks quite stable has healthy granulation. 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 Almost fully epithelialized on the original superior area using Hydrofera Blue DTI on the left heel has peeled off this week to reveal a small superficial wound under denuded skin and subcutaneous tissue Both areas between the first and second toes look better including nothing open on the left 02/21/18; The patient's wounds on his left ischial tuberosity and posterior left greater trochanter actually looked better. He has a large area of irritation around the area which I think is contact dermatitis. I am doubtful that this is fungal His original wound on the left lateral calf continues to improve we have been using Hydrofera Blue There is no open area in the left first second toe web space although there is a lot of thick callus The DTI on the left heel required debridement today of necrotic surface eschar and subcutaneous tissue using silver alginate Finally the area on the right first second toe webspace continues to contract using silver alginate and ketoconazole 02/28/18 Left ischial tuberosity wounds look better using silver alginate. Original wound on the left calf only has one small open area left using Hydrofera Blue DTI on the left heel required debridement mostly removing skin from around this wound surface. Using silver alginate The areas on the right first/second toe web space using silver alginate and ketoconazole 03/08/18 on evaluation today patient appears to be doing decently well as best I  can tell in regard to his wounds. This is the first time that I have seen him as he generally is followed by Dr. Dellia Nims. With that being said none of his wounds appear to be infected he does have an area where there is  some skin covering what appears to be a new wound on the left dorsal surface of his great toe. This is right at the nail bed. With that being said I do believe that debrided away some of the excess skin can be of benefit in this regard. Otherwise he has been tolerating the dressing changes without complication. 03/14/18; patient arrives today with the multiplicity of wounds that we are following. He has not been systemically unwell Original wound on the left lateral calf now only has 2 small open areas we've been using Hydrofera Blue which should continue The deep tissue injury on the left heel requires debridement today. We've been using silver alginate The left first second toe and the right first second toe are both are reminiscence what I think was tinea pedis. Apparently some of the callus Surface between the toes was removed last week when it started draining. Purulent drainage coming from the wound on the ischial tuberosity on the left. 03/21/18-He is here in follow-up evaluation for multiple wounds. 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 He tolerated debridement. The only change to last week's recommendations is to discontinue antifungal cream between toes. He will follow-up next week 03/28/18; following up for multiple wounds;Concern this week is streaking redness and swelling in the right foot. He is going to need antibiotics for this. 03/31/18; follow-up for right foot cellulitis. Streaking redness and swelling in the right foot on 03/28/18. 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 I put him on clindamycin 300 mg every 6 and brought him in for a quick check. He has an open  wound between his first and second toes on the right foot as a potential source. 04/04/18; Right foot cellulitis is resolving he is completing clindamycin. This is truly good news Left lateral calf wound which is initial wound only has one small open area inferiorly this is close to healing out. He has compression stockings. We will use Hydrofera Blue right down to the epithelialization of this Nonviable surface on the left heel which was initially pressure with a DTI. We've been using Hydrofera Blue. I'm going to switch this back to silver alginate Left first second toe/tinea pedis this looks better using silver alginate Right first second toe tinea pedis using silver alginate Large pressure ulcers on theLeft ischial tuberosity. Small wound here Looks better. I am uncertain about the surface over the large wound. Using silver alginate 04/11/18; Cellulitis in the right foot is resolved Left lateral calf wound which was his original wounds still has 2 tiny open areas remaining this is just about closed Nonviable surface on the left heel is better but still requires debridement Left first second toe/tinea pedis still open using silver alginate Right first second toe wound tinea pedis I asked him to go back to using ketoconazole and silver alginate Large pressure ulcers on the left ischial tuberosity this shear injury here is resolved. Wound is smaller. No evidence of infection using silver alginate 04/18/18; Patient arrives with an intense area of cellulitis in the right mid lower calf extending into the right heel area. Bright red and warm. Smaller area on the left anterior leg. He has a significant history of MRSA. He will definitely need antibioticsdoxycycline He now has 2 open areas on the left ischial tuberosity the original large wound and now a satellite area which I think was above his initial satellite areas. Not a wonderful surface on this satellite area  surrounding erythema  which looks like pressure related. His left lateral calf wound again his original wound is just about closed Left heel pressure injury still requiring debridement Left first second toe looks a lot better using silver alginate Right first second toe also using silver alginate and ketoconazole cream also looks better 04/20/18; the patient was worked in early today out of concerns with his cellulitis on the right leg. I had started him on doxycycline. This was 2 days ago. His wife was concerned about the swelling in the area. Also concerned about the left buttock. He has not been systemically unwell no fever chills. No nausea vomiting or diarrhea 04/25/18; the patient's left buttock wound is continued to deteriorate he is using Hydrofera Blue. He is still completing clindamycin for the cellulitis on the right leg although all of this looks better. 05/02/18 Left buttock wound still with a lot of drainage and a very tightly adherent fibrinous necrotic surface. He has a deeper area superiorly The left lateral calf wound is still closed DTI wound on the left heel necrotic surface especially the circumference using Iodoflex Areas between his left first second toe and right first second toe both look better. Dorsally and the right first second toe he had a necrotic surface although at smaller. In using silver alginate and ketoconazole. I did a culture last week which was a deep tissue culture of the reminiscence of the open wound on the right first second toe dorsally. This grew a few Acinetobacter and a few methicillin-resistant staph aureus. Nevertheless the area actually this week looked better. I didn't feel the need to specifically address this at least in terms of systemic antibiotics. 05/09/18; wounds are measuring larger more drainage per our intake. We are using Santyl covered with alginate on the large superficial buttock wounds, Iodosorb on the left heel, ketoconazole and silver alginate to the  dorsal first and second toes bilaterally. 05/16/18; The area on his left buttock better in some aspects although the area superiorly over the ischial tuberosity required an extensive debridement.using Santyl Left heel appears stable. Using Iodoflex The areas between his first and second toes are not bad however there is spreading erythema up the dorsal aspect of his left foot this looks like cellulitis again. He is insensate the erythema is really very brilliant.o Erysipelas He went to see an allergist days ago because he was itching part of this he had lab work done. This showed a white count of 15.1 with 70% neutrophils. Hemoglobin of 11.4 and a platelet count of 659,000. Last white count we had in Epic was a 2-1/2 years ago which was 25.9 but he was ill at the time. He was able to show me some lab work that was done by his primary physician the pattern is about the same. 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 But prompted me to go ahead and do x-rays of both feet and the pelvis rule out osteomyelitis. He also had a comprehensive metabolic panel this was reasonably normal his albumin was 3.7 liver function tests BUN/creatinine all normal 05/23/18; x-rays of both his feet from last week were negative for underlying pulmonary abnormality. The x-ray of his pelvis however showed mild irregularity in the left ischial which may represent some early osteomyelitis. The wound in the left ischial continues to get deeper clearly now exposed muscle. Each week necrotic surface material over this area. Whereas the rest of the wounds do not look  so bad. The left ischial wound we have been using Santyl and calcium alginate To the left heel surface necrotic debris using Iodoflex The left lateral leg is still healed Areas on the left dorsal foot and the right dorsal foot are about the same. There is some inflammation on the left which might represent contact  dermatitis, fungal dermatitis I am doubtful cellulitis although this looks better than last week 05/30/18; CT scan done at Hospital did not show any osteomyelitis or abscess. Suggested the possibility of underlying cellulitis although I don't see a lot of evidence of this at the bedside The wound itself on the left buttock/upper thigh actually looks somewhat better. No debridement Left heel also looks better no debridement continue Iodoflex Both dorsal first second toe spaces appear better using Lotrisone. Left still required debridement 06/06/18; Intake reported some purulent looking drainage from the left gluteal wound. Using Santyl and calcium alginate Left heel looks better although still a nonviable surface requiring debridement The left dorsal foot first/second webspace actually expanding and somewhat deeper. I may consider doing a shave biopsy of this area Right dorsal foot first/second webspace appears stable to improved. Using Lotrisone and silver alginate to both these areas 06/13/18 Left gluteal surface looks better. Now separated in the 2 wounds. No debridement required. Still drainage. We'll continue silver alginate Left heel continues to look better with Iodoflex continue this for at least another week Of his dorsal foot wounds the area on the left still has some depth although it looks better than last week. We've been using Lotrisone and silver alginate 06/20/18 Left gluteal continues to look better healthy tissue Left heel continues to look better healthy granulation wound is smaller. He is using Iodoflex and his long as this continues continue the Iodoflex Dorsal right foot looks better unfortunately dorsal left foot does not. There is swelling and erythema of his forefoot. He had minor trauma to this several days ago but doesn't think this was enough to have caused any tissue injury. Foot looks like cellulitis, we have had this problem before 06/27/18 on evaluation today  patient appears to be doing a little worse in regard to his foot ulcer. 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 Both of which would really be his only options for treating this infection. In the past he has been given and effusion of Orbactiv. This is done very well for him in the past again it's one time dosing IV antibiotic therapy. Subsequently I do believe this is something we're gonna need to see about doing at this point in time. Currently his other wounds seem to be doing somewhat better in my pinion I'm pretty happy in that regard. 07/03/18 on evaluation today patient's wounds actually appear to be doing fairly well. He has been tolerating the dressing changes without complication. All in all he seems to be showing signs of improvement. In regard to the antibiotics he has been dealing with infectious disease since I saw him last week as far as getting this scheduled. In the end [facility]'s [facility] this coming Friday. In the meantime he has been continuing to perform the dressing changes in such as previous. There does not appear to be any evidence of infection worsengin at this time. 07/10/18; Since I last saw this man 2  weeks ago things have actually improved. IV antibiotics of resulted in less forefoot erythema although there is still some present. He is not systemically unwell Left buttock wounds 2 now have no depth there is increased epithelialization Using silver alginate Left heel still requires debridement using Iodoflex Left dorsal foot still with a sizable wound about the size of a border but healthy granulation Right dorsal foot still with a slitlike area using silver alginate 07/18/18; the patient's cellulitis in the left foot is improved in fact I think it is on its way to  resolving. Left buttock wounds 2 both look better although the larger one has hypertension granulation we've been using silver alginate Left heel has some thick circumferential redundant skin over the wound edge which will need to be removed today we've been using Iodoflex Left dorsal foot is still a sizable wound required debridement using silver alginate The right dorsal foot is just about closed only a small open area remains here 07/25/18; left foot cellulitis is resolved Left buttock wounds 2 both look better. Hyper-granulation on the major area Left heel as some debris over the surface but otherwise looks a healthier wound. Using silver collagen Right dorsal foot is just about closed 07/31/18; arrives with our intake nurse worried about purulent drainage from the buttock. We had hyper-granulation here last week His buttock wounds 2 continue to look better Left heel some debris over the surface but measuring smaller. Right dorsal foot unfortunately has openings between the toes Left foot superficial wound looks less aggravated. 08/07/18 Buttock wounds continue to look better although some of her granulation and the larger medial wound. silver alginate Left heel continues to look a lot better.silver collagen Left foot superficial wound looks less stable. Requires debridement. He has a new wound superficial area on the foot on the lateral dorsal foot. Right foot looks better using silver alginate without Lotrisone 08/14/2018; patient was in the ER last week diagnosed with a UTI. He is now on Cefpodoxime and Macrodantin. Buttock wounds continued to be smaller. Using silver alginate Left heel continues to look better using silver collagen Left foot superficial wound looks as though it is improving Right dorsal foot area is just about healed. 08/21/2018; patient is completed his antibiotics for his UTI. He has 2 open areas on the buttocks. There is still not closed although the surface looks  satisfactory. Using silver alginate Left heel continues to improve using silver collagen The bilateral dorsal foot areas which are at the base of his first and second toes/possible tinea pedis are actually stable on the left but worse on the right. The area on the left required debridement of necrotic surface. After debridement I obtained a specimen for PCR culture. The right dorsal foot which is been just about healed last week is now reopened 08/28/2018; culture done on the left dorsal foot showed coag negative staph both staph epidermidis and Lugdunensis. I think this is worthwhile initiating systemic treatment. I will use doxycycline given his long list of allergies. The area on the left heel slightly improved but still requiring debridement. The large wound on the buttock is just about closed whereas the smaller one is larger. Using silver alginate in this area 09/04/2018; patient is completing his doxycycline for the left foot although this continues to be a very difficult wound area with very adherent necrotic debris. We are using silver alginate to all his wounds right foot left foot and the small wounds on his buttock, silver collagen on the left heel.  09/11/2018; once again this patient has intense erythema and swelling of the left forefoot. Lesser degrees of erythema in the right foot. He has a long list of allergies and intolerances. I will reinstitute doxycycline. 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 Using silver alginate Left heel also looks better using silver collagen Unfortunately both the areas on his feet look worse. The area on the left first second webspace is now gone through to the plantar part of his foot. The area on the left foot anteriorly is irritated with erythema and swelling in the forefoot. 09/25/2018 His wound on the left plantar heel looks better. Using silver collagen The area on the left buttock 2 small  remnant areas. One is closed one is still open. Using silver alginate The areas between both his first and second toes look worse. This in spite of long-standing antifungal therapy with ketoconazole and silver alginate which should have antifungal activity He has small areas around his original wound on the left calf one is on the bottom of the original scar tissue and one superiorly both of these are small and superficial but again given wound history in this site this is worrisome 10/02/2018 Left plantar heel continues to gradually contract using silver collagen Left buttock wound is unchanged using silver alginate The areas on his dorsal feet between his first and second toes bilaterally look about the same. I prescribed clindamycin ointment to see if we can address chronic staph colonization and also the underlying possibility of erythrasma The left lateral lower extremity wound is actually on the lateral part of his ankle. Small open area here. We have been using silver alginate 10/09/2018; Left plantar heel continues to look healthy and contract. No debridement is required Left buttock slightly smaller with a tape injury wound just below which was new this week Dorsal feet somewhat improved I have been using clindamycin Left lateral looks lower extremity the actual open area looks worse although a lot of this is epithelialized. I am going to change to silver collagen today He has a lot more swelling in the right leg although this is not pitting not red and not particularly warm there is a lot of spasm in the right leg usually indicative of people with paralysis of some underlying discomfort. We have reviewed his vascular status from 2017 he had a left greater saphenous vein ablation. I wonder about referring him back to vascular surgery if the area on the left leg continues to deteriorate. 10/16/2018 in today for follow-up and management of multiple lower extremity ulcers. His left  Buttock wound is much lower smaller and almost closed completely. The wound to the left ankle has began to reopen with Epithelialization and some adherent slough. He has multiple new areas to the left foot and leg. The left dorsal foot without much improvement. Wound present between left great webspace and 2nd toe. Erythema and edema present right leg. Right LE ultrasound obtained on 10/10/18 was negative for DVT. 10/23/2018; Left buttock is closed over. Still dry macerated skin but there is no open wound. I suspect this is chronic pressure/moisture Left lateral calf is quite a bit worse than when I saw this last. There is clearly drainage here he has macerated skin into the left plantar heel. We will change the primary dressing to alginate Left dorsal foot has some improvement in overall wound area. Still using clindamycin and silver alginate Right dorsal foot about the same  as the left using clindamycin and silver alginate The erythema in the right leg has resolved. He is DVT rule out was negative Left heel pressure area required debridement although the wound is smaller and the surface is health 10/26/2018 The patient came back in for his nurse check today predominantly because of the drainage coming out of the left lateral leg with a recent reopening of his original wound on the left lateral calf. He comes in today with a large amount of surrounding erythema around the wound extending from the calf into the ankle and even in the area on the dorsal foot. He is not systemically unwell. He is not febrile. Nevertheless this looks like cellulitis. We have been using silver alginate to the area. I changed him to a regular visit and I am going to prescribe him doxycycline. The rationale here is a long list of medication intolerances and a history of MRSA. I did not see anything that I thought would provide a valuable culture 10/30/2018 Follow-up from his appointment 4 days ago with really an  extensive area of cellulitis in the left calf left lateral ankle and left dorsal foot. I put him on doxycycline. He has a long list of medication allergies which are true allergy reactions. 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 In any case he is a lot better today. The erythema especially in the anterior and lateral left calf is better. He still has left ankle erythema. He also is complaining about increasing edema in the right leg we have only been using Kerlix Coban and he has been doing the wraps at home. Finally he has a spotty rash on the medial part of his upper left calf which looks like folliculitis or perhaps wrap occlusion type injury. Small superficial macules not pustules 11/06/18 patient arrives today with again a considerable degree of erythema around the wound on the left lateral calf extending into the dorsal ankle and dorsal foot. This is a lot worse than when I saw this last week. He is on doxycycline really with not a lot of improvement. He has not been systemically unwell Wounds on the; left heel actually looks improved. Original area on the left foot and proximity to the first and second toes looks about the same. He has superficial areas on the dorsal foot, anterior calf and then the reopening of his original wound on the left lateral calf which looks about the same The only area he has on the right is the dorsal webspace first and second which is smaller. He has a large area of dry erythematous skin on the left buttock small open area here. 11/13/2018; the patient arrives in much better condition. The erythema around the wound on the left lateral calf is a lot better. Not sure whether this was the clindamycin or the TCA and ketoconazole or just in the improvement in edema control [stasis dermatitis]. In any case this is a lot better. The area on the left heel is very small and just about resolved  using silver collagen we have been using silver alginate to the areas on his dorsal feet 11/20/2018; his wounds include the left lateral calf, left heel, dorsal aspects of both feet just proximal to the first second webspace. He is stable to slightly improved. I did not think any changes to his dressings were going to be necessary 11/27/2018 he has a reopening on the left buttock which is surrounded by  what looks like tinea or perhaps some other form of dermatitis. The area on the left dorsal foot has some erythema around it I have marked this area but I am not sure whether this is cellulitis or not. Left heel is not closed. Left calf the reopening is really slightly longer and probably worse 1/13; in general things look better and smaller except for the left dorsal foot. Area on the left heel is just about closed, left buttock looks better only a small wound remains in the skin looks better [using Lotrisone] 1/20; the area on the left heel only has a few remaining open areas here. Left lateral calf about the same in terms of size, left dorsal foot slightly larger right lateral foot still not closed. The area on the left buttock has no open wound and the surrounding skin looks a lot better 1/27; the area on the left heel is closed. Left lateral calf better but still requiring extensive debridements. The area on his left buttock is closed. He still has the open areas on the left dorsal foot which is slightly smaller in the right foot which is slightly expanded. We have been using Iodoflex on these areas as well 2/3; left heel is closed. Left lateral calf still requiring debridement using Iodoflex there is no open area on his left buttock however he has dry scaly skin over a large area of this. Not really responding well to the Lotrisone. Finally the areas on his dorsal feet at the level of the first second webspace are slightly smaller on the right and about the same on the left. Both of these  vigorously debrided with Anasept and gauze 2/10; left heel remains closed he has dry erythematous skin over the left buttock but there is no open wound here. Left lateral leg has come in and with. Still requiring debridement we have been using Iodoflex here. Finally the area on the left dorsal foot and right dorsal foot are really about the same extremely dry callused fissured areas. He does not yet have a dermatology appointment 2/17; left heel remains closed. He has a new open area on the left buttock. The area on the left lateral calf is bigger longer and still covered in necrotic debris. No major change in his foot areas bilaterally. I am awaiting for a dermatologist to look on this. We have been using ketoconazole I do not know that this is been doing any good at all. 2/24; left heel remains closed. The left buttock wound that was new reopening last week looks better. The left lateral calf appears better also although still requires debridement. The major area on his foot is the left first second also requiring debridement. We have been putting Prisma on all wounds. I do not believe that the ketoconazole has done too much good for his feet. He will use Lotrisone I am going to give him a 2-week course of terbinafine. We still do not have a dermatology appointment 3/2 left heel remains closed however there is skin over bone in this area I pointed this out to him today. The left buttock wound is epithelialized but still does not look completely stable. The area on the left leg required debridement were using silver collagen here. With regards to his feet we changed to Lotrisone last week and silver alginate. 3/9; left heel remains closed. Left buttock remains closed. The area on the right foot is essentially closed. The left foot remains unchanged. Slightly smaller on the left lateral calf.  Using silver collagen to both of these areas 3/16-Left heel remains closed. Area on right foot is closed.  Left lateral calf above the lateral malleolus open wound requiring debridement with easy bleeding. Left dorsal wound proximal to first toe also debrided. Left ischial area open new. Patient has been using Prisma with wrapping every 3 days. Dermatology appointment is apparently tomorrow.Patient has completed his terbinafine 2-week course with some apparent improvement according to him, there is still flaking and dry skin in his foot on the left 3/23; area on the right foot is reopened. The area on the left anterior foot is about the same still a very necrotic adherent surface. He still has the area on the left leg and reopening is on the left buttock. He apparently saw dermatology although I do not have a note. According to the patient who is usually fairly well informed they did not have any good ideas. Put him on oral terbinafine which she is been on before. 3/30; using silver collagen to all wounds. Apparently his dermatologist put him on doxycycline and rifampin presumably some culture grew staph. I do not have this result. He remains on terbinafine although I have used terbinafine on him before 4/6; patient has had a fairly substantial reopening on the right foot between the first and second toes. He is finished his terbinafine and I believe is on doxycycline and rifampin still as prescribed by dermatology. We have been using silver collagen to all his wounds although the patient reports that he thinks silver alginate does better on the wounds on his buttock. 4/13; the area on his left lateral calf about the same size but it did not require debridement. Left dorsal foot just proximal to the webspace between the first and second toes is about the same. Still nonviable surface. I note some superficial bronze discoloration of the dorsal part of his foot Right dorsal foot just proximal to the first and second toes also looks about the same. I still think there may be the same discoloration I  noted above on the left Left buttock wound looks about the same 4/20; left lateral calf appears to be gradually contracting using silver collagen. 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 The left dorsal foot wound is debrided of tightly adherent necrotic debris and really cleans up quite nicely. The right area is worse with expansion. I did not debride this it is now over the base of the second toe The area on his left buttock is smaller no debridement is required using silver collagen 5/4; left calf continues to make good progress. He arrives with erythema around the wounds on his dorsal foot which even extends to the plantar aspect. Very concerning for coexistent infection. He is finished the erythromycin I gave him for possible erythrasma this does not seem to have helped. The area on the left foot is about the same base of the dorsal toes Is area on the buttock looks improved on the left 5/11; left calf and left buttock continued to make good progress. Left foot is about the same to slightly improved. Major problem is on the right foot. He has not had an x-ray. Deep tissue culture I did last week showed both Enterobacter and E. coli. I did not change the doxycycline I put him on empirically although neither 1 of these were plated to doxycycline. He arrives today with the erythema looking worse on both the dorsal and plantar foot. Macerated skin on the  bottom of the foot. he has not been systemically unwell 5/18-Patient returns at 1 week, left calf wound appears to be making some progress, left buttock wound appears slightly worse than last time, left foot wound looks slightly better, right foot redness is marginally better. X-ray of both feet show no air or evidence of osteomyelitis. Patient is finished his Omnicef and terbinafine. He continues to have macerated skin on the bottom of the left foot as well as right 5/26; left calf  wound is better, left buttock wound appears to have multiple small superficial open areas with surrounding macerated skin. X-rays that I did last time showed no evidence of osteomyelitis in either foot. He is finished cefdinir and doxycycline. I do not think that he was on terbinafine. He continues to have a large superficial open area on the right foot anterior dorsal and slightly between the first and second toes. I did send him to dermatology 2 months ago or so wondering about whether they would do a fungal scraping. I do not believe they did but did do a culture. We have been using silver alginate to the toe areas, he has been using antifungals at home topically either ketoconazole or Lotrisone. We are using silver collagen on the left foot, silver alginate on the right, silver collagen on the left lateral leg and silver alginate on the left buttock 6/1; left buttock area is healed. We have the left dorsal foot, left lateral leg and right dorsal foot. We are using silver alginate to the areas on both feet and silver collagen to the area on his left lateral calf 6/8; the left buttock apparently reopened late last week. He is not really sure how this happened. He is tolerating the terbinafine. Using silver alginate to all wounds 6/15; left buttock wound is larger than last week but still superficial. Came in the clinic today with a report of purulence from the left lateral leg I did not identify any infection Both areas on his dorsal feet appear to be better. He is tolerating the terbinafine. Using silver alginate to all wounds 6/22; left buttock is about the same this week, left calf quite a bit better. His left foot is about the same however he comes in with erythema and warmth in the right forefoot once again. Culture that I gave him in the beginning of May showed Enterobacter and E. coli. I gave him doxycycline and things seem to improve although neither 1 of these organisms was specifically  plated. 6/29; left buttock is larger and dry this week. Left lateral calf looks to me to be improved. Left dorsal foot also somewhat improved right foot completely unchanged. The erythema on the right foot is still present. He is completing the Ceftin dinner that I gave him empirically [see discussion above.) 7/6 - All wounds look to be stable and perhaps improved, the left buttock wound is slightly smaller, per patient bleeds easily, completed ceftin, the right foot redness is less, he is on terbinafine 7/13; left buttock wound about the same perhaps slightly narrower. Area on the left lateral leg continues to narrow. Left dorsal foot slightly smaller right foot about the same. We are using silver alginate on the right foot and Hydrofera Blue to the areas on the left. Unna boot on the left 2 layer compression on the right 7/20; left buttock wound absolutely the same. Area on lateral leg continues to get better. Left dorsal foot require debridement as did the right no major change in the  7/27; left buttock wound the same size necrotic debris over the surface. The area on the lateral leg is closed once again. His left foot looks better right foot about the same although there is some involvement now of the posterior first second toe area. He is still on terbinafine which I have given him for a month, not certain a centimeter major change 06/25/19-All wounds appear to be slightly improved according to report, left buttock wound looks clean, both foot wounds have minimal to no debris the right dorsal foot has minimal slough. We are using Hydrofera Blue to the left and silver alginate to the right foot and ischial wound. 8/10-Wounds all appear to be around the same, the right forefoot distal part has some redness which was not there before, however the wound looks clean and small. Ischial wound looks about the same with no changes 8/17; his wound on the left lateral calf which was his original chronic  venous insufficiency wound remains closed. Since I last saw him the areas on the left dorsal foot right dorsal foot generally appear better but require debridement. The area on his left initial tuberosity appears somewhat larger to me perhaps hyper granulated and bleeds very easily. We have been using Hydrofera Blue to the left dorsal foot and silver alginate to everything else 8/24; left lateral calf remains closed. The areas on his dorsal feet on the webspace of the first and second toes bilaterally both look better. The area on the left buttock which is the pressure ulcer stage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 back for check of possible cellulitis in his left foot however the Apligraf dressing was inadvertently removed therefore we elected to prep the wound for a second Apligraf. I put him on doxycycline on 6/1 the erythema in the foot 05/03/16 we did not remove the dressing from the superior wound as this is where I put all of his last Apligraf. Surface debridement done with a curette of the lower wound which looks very healthy. The area on the left  foot also looks quite satisfactory at the dorsal artery at the first and second toes 05/10/16; continue Apligraf to this. Her wound, Hydrafera to the lower wound. He has a new area on the right second toe. Left dorsal foot firstoosecond toe also looks improved 05/24/16; wound dimensions must be smaller I was able to use Apligraf to all 3 remaining wound areas. 06/07/16 patient's last Apligraf was 2 weeks ago. He arrives today with the 2 wounds on his lateral left leg joined together. This would have to be seen as a negative. He also has a small wound in his first and second toe on the left dorsally with quite a bit of surrounding erythema in the first second and third toes. This looks to be infected or inflamed, very difficult clinical call. 06/21/16: lateral left leg combined wounds. Adherent surface slough area on the left dorsal foot at roughly the fourth toe looks improved 07/12/16; he now has a single linear wound on the lateral left leg. This does not look to be a lot changed from when I lost saw this. The area on his dorsal left foot looks considerably better however. 08/02/16; no major change in the substantial area on his left lateral leg since last time. We have been using Hydrofera Blue for a prolonged period of time now. The area on his left foot is also unchanged from last review 07/19/16; the area on his dorsal foot on the left looks considerably smaller. He is beginning to have significant rims of epithelialization on the lateral left leg wound. This also looks better. 08/05/16; the patient came in for a nurse visit today. Apparently the area on his left lateral leg looks better and it was wrapped. However in general discussion the patient noted a new area on the dorsal aspect of his right second toe. The exact etiology of this is unclear but likely relates to pressure. 08/09/16 really the area on the left lateral leg did not really look that healthy today perhaps slightly larger  and measurements. The area on his dorsal right second toe is improved also the left foot wound looks stable to improved 08/16/16; the area on the last lateral leg did not change any of dimensions. Post debridement with a curet the area looked better. Left foot wound improved and the area on the dorsal right second toe is improved 08/23/16; the area on the left lateral leg may be slightly smaller both in terms of length and width. Aggressive debridement with a curette afterwards the tissue appears healthier. Left foot wound appears improved in the area on the dorsal right second toe is improved 08/30/16 patient developed a fever over the weekend and was seen in an urgent care. Felt to have a UTI and put on doxycycline. He has been since changed over the phone to [facility]. After we took off the wrap on his right leg today the leg is swollen warm and erythematous, probably more likely the source of the fever 09/06/16; have been  using collagen to the major left leg wound, silver alginate to the area on his anterior foot/toes 09/13/16; the areas on his anterior foot/toes on both sides appear to be virtually closed. Extensive wound on the left lateral leg perhaps slightly narrower but each visit still covered an adherent surface slough 09/16/16 patient was in for his usual Thursday nurse visit however the intake nurse noted significant erythema of his dorsal right foot. He is also running a low-grade fever and having increasing spasms in the right leg 09/20/16 here for cellulitis involving his right great toes and forefoot. This is a lot better. Still requiring debridement on his left lateral leg. Santyl direct says he needs prior authorization. Therefore his wife cannot change this at home 09/30/16; the patient's extensive area on the left lateral calf and ankle perhaps somewhat better. Using Santyl. The area on the left toes is healed and I think the area on his right dorsal foot is healed as well. There  is no cellulitis or venous inflammation involving the right leg. He is going to need compression stockings here. 10/07/16; the patient's extensive wound on the left lateral calf and ankle does not measure any differently however there appears to be less adherent surface slough using Santyl and aggressive weekly debridements 10/21/16; no major change in the area on the left lateral calf. Still the same measurement still very difficult to debridement adherent slough and nonviable subcutaneous tissue. This is not really been helped by several weeks of Santyl. Previously for 2 weeks I used Iodoflex for a short period. A prolonged course of Hydrofera Blue didn't really help. I'm not sure why I only used 2 weeks of Iodoflex on this there is no evidence of surrounding infection. He has a small area on the right second toe which looks as though it's progressing towards closure 10/28/16; the wounds on his toes appear to be closed. No major change in the left lateral leg wound although the surface looks somewhat better using Iodoflex. He has had previous arterial studies that were normal. He has had reflux studies and is status post ablation although I don't have any exact notes on which vein was ablated. I'll need to check the surgical record 11/04/16; he's had a reopening between the first and second toe on the left and right. No major change in the left lateral leg wound. There is what appears to be cellulitis of the left dorsal foot 11/18/16 the patient was hospitalized initially in [place] and then subsequently transferred to [facility] and was admitted there from 11/09/16 through 11/12/16. He had developed progressive cellulitis on the right leg in spite of the doxycycline I gave him. I'd spoken to the hospitalist in [place] who was concerned about continuing leukocytosis. CT scan is what I suggested this was done which showed soft tissue swelling without evidence of osteomyelitis or an underlying  abscess blood cultures were negative. At [facility] he was treated with vancomycin and Primaxin and then add an infectious disease consult. He was transitioned to Ceftaroline. He has been making progressive improvement. Overall a severe cellulitis of the right leg. He is been using silver alginate to her original wound on the left leg. The wounds in his toes on the right are closed there is a small open area on the base of the left second toe 11/26/15; the patient's right leg is much better although there is still some edema here this could be reminiscent from his severe cellulitis likely on top of some degree of  lymphedema. His left anterior leg wound has less surface slough as reported by her intake nurse. Small wound at the base of the left second toe 12/02/16; patient's right leg is better and there is no open wound here. His left anterior lateral leg wound continues to have a healthy-looking surface. Small wound at the base of the left second toe however there is erythema in the left forefoot which is worrisome 12/16/16; is no open wounds on his right leg. We took measurements for stockings. His left anterior lateral leg wound continues to have a healthy-looking surface. I'm not sure where we were with the Apligraf run through his insurance. We have been using Iodoflex. He has a thick eschar on the left first second toe interface, I suspect this may be fungal however there is no visible open 12/23/16; no open wound on his right leg. He has 2 small areas left of the linear wound that was remaining last week. We have been using Prisma, I thought I have disclosed this week, we can only look forward to next week 01/03/17; the patient had concerning areas of erythema last week, already on doxycycline for UTI through his primary doctor. The erythema is absolutely no better there is warmth and swelling both medially from the left lateral leg wound and also the dorsal left foot. 01/06/17- Patient is here  for follow-up evaluation of his left lateral leg ulcer and bilateral feet ulcers. He is on oral antibiotic therapy, tolerating that. Nursing staff and the patient states that the erythema is improved from Monday. 01/13/17; the predominant left lateral leg wound continues to be problematic. I had put Apligraf on him earlier this month once. However he subsequently developed what appeared to be an intense cellulitis around the left lateral leg wound. I gave him Dalvance I think on 2/12 perhaps 2/13 he continues on cefdinir. The erythema is still present but the warmth and swelling is improved. I am hopeful that the cellulitis part of this control. I wouldn't be surprised if there is an element of venous inflammation as well. 01/17/17. The erythema is present but better in the left leg. His left lateral leg wound still does not have a viable surface buttons certain parts of this long thin wound it appears like there has been improvement in dimensions. 01/20/17; the erythema still present but much better in the left leg. I'm thinking this is his usual degree of chronic venous inflammation. The wound on the left leg looks somewhat better. Is less surface slough 01/27/17; erythema is back to the chronic venous inflammation. The wound on the left leg is somewhat better. I am back to the point where I like to try an Apligraf once again 02/10/17; slight improvement in wound dimensions. Apligraf #2. He is completing his doxycycline 02/14/17; patient arrives today having completed doxycycline last Thursday. This was supposed to be a nurse visit however once again he hasn't tense erythema from the medial part of his wound extending over the lower leg. Also erythema in his foot this is roughly in the same distribution as last time. He has baseline chronic venous inflammation however this is a lot worse than the baseline I have learned to accept the on him is baseline inflammation 02/24/17- patient is here for follow-up  evaluation. He is tolerating compression therapy. His voicing no complaints or concerns he is here anticipating an Apligraf 03/03/17; he arrives today with an adherent necrotic surface. I don't think this is surface is going to be amenable for Apligraf's.  The erythema around his wound and on the left dorsal foot has resolved he is off antibiotics 03/10/17; better-looking surface today. I don't think he can tolerate Apligraf's. He tells me he had a wound VAC after a skin graft years ago to this area and they had difficulty with a seal. The erythema continues to be stable around this some degree of chronic venous inflammation but he also has recurrent cellulitis. We have been using Iodoflex 03/17/17; continued improvement in the surface and may be small changes in dimensions. Using Iodoflex which seems the only thing that will control his surface 03/24/17- He is here for follow up evaluation of his LLE lateral ulceration and ulcer to right dorsal foot/toe space. He is voicing no complaints or concerns, He is tolerating compression wrap. 03/31/17 arrives today with a much healthier looking wound on the left lower extremity. We have been using Iodoflex for a prolonged period of time which has for the first time prepared and adequate looking wound bed although we have not had much in the way of wound dimension improvement. He also has a small wound between the first and second toe on the right 04/07/17; arrives today with a healthy-looking wound bed and at least the top 50% of this wound appears to be now her. No debridement was required I have changed him to [facility] last week after prolonged Iodoflex. He did not do well with Apligraf's. We've had a re-opening between the first and second toe on the right 04/14/17; arrives today with a healthier looking wound bed contractions and the top 50% of this wound and some on the lesser 50%. Wound bed appears healthy. The area between the first and second toe on  the right still remains problematic 04/21/17; continued very gradual improvement. Using [facility] 04/28/17; continued very gradual improvement in the left lateral leg venous insufficiency wound. His periwound erythema is very mild. We have been using Hydrofera Blue. Wound is making progress especially in the superior 50% 05/05/17; he continues to have very gradual improvement in the left lateral venous insufficiency wound. Both in terms with an length rings are improving. I debrided this every 2 weeks with #5 curet and we have been using Hydrofera Blue and again making good progress With regards to the wounds between his right first and second toe which I thought might of been tinea pedis he is not making as much progress very dry scaly skin over the area. Also the area at the base of the left first and second toe in a similar condition 05/12/17; continued gradual improvement in the refractory left lateral venous insufficiency wound on the left. Dimension smaller. Surface still requiring debridement using Hydrofera Blue 05/19/17; continued gradual improvement in the refractory left lateral venous ulceration. Careful inspection of the wound bed underlying rumination suggested some degree of epithelialization over the surface no debridement indicated. Continue Hydrofera Blue difficult areas between his toes first and third on the left than first and second on the right. I'm going to change to silver alginate from silver collagen. Continue ketoconazole as I suspect underlying tinea pedis 05/26/17; left lateral leg venous insufficiency wound. We've been using Hydrofera Blue. I believe that there is expanding epithelialization over the surface of the wound albeit not coming from the wound circumference. This is a bit of an odd situation in which the epithelialization seems to be coming from the surface of the wound rather than in the exact circumference. There is still small open areas mostly along the  lateral margin of the wound. ooHe has unchanged areas between the left first and second and the right first second toes which I been treating for tenia pedis 06/02/17; left lateral leg venous insufficiency wound. We have been using Hydrofera Blue. Somewhat smaller from the wound circumference. The surface of the wound remains a bit on it almost epithelialized sedation in appearance. I use an open curette today debridement in the surface of all of this especially the edges ooSmall open wounds remaining on the dorsal right first and second toe interspace and the plantar left first second toe and her face on the left 06/09/17; wound on the left lateral leg continues to be smaller but very gradual and very dry surface using Hydrofera Blue 06/16/17 requires weekly debridements now on the left lateral leg although this continues to contract. I changed to silver collagen last week because of dryness of the wound bed. Using Iodoflex to the areas on his first and second toes/web space bilaterally 06/24/17; patient with history of paraplegia also chronic venous insufficiency with lymphedema. Has a very difficult wound on the left lateral leg. This has been gradually reducing in terms of with but comes in with a very dry adherent surface. High switch to silver collagen a week or so ago with hydrogel to keep the area moist. This is been refractory to multiple dressing attempts. He also has areas in his first and second toes bilaterally in the anterior and posterior web space. I had been using Iodoflex here after a prolonged course of silver alginate with ketoconazole was ineffective [question tinea pedis] 07/14/17; patient arrives today with a very difficult adherent material over his left lateral lower leg wound. He also has surrounding erythema and poorly controlled edema. He was switched his Santyl last visit which the nurses are applying once during his doctor visit and once on a nurse visit. He was also  reduced to 2 layer compression I'm not exactly sure of the issue here. 07/21/17; better surface today after 1 week of Iodoflex. Significant cellulitis that we treated last week also better. [Doxycycline] 07/28/17 better surface today with now 2 weeks of Iodoflex. Significant cellulitis treated with doxycycline. He has now completed the doxycycline and he is back to his usual degree of chronic venous inflammation/stasis dermatitis. He reminds me he has had ablations surgery here 08/04/17; continued improvement with Iodoflex to the left lateral leg wound in terms of the surface of the wound although the dimensions are better. 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 Problematic areas on the plantar aspect of the first second toe web space on the left and the dorsal aspect of the first second toe web space on the right. At one point I felt these were probably related to chronic fungal infections in treated him aggressively for this although we have not made any improvement here. 08/11/17; left lateral leg. Surface continues to improve with the Iodoflex although we are not seeing much improvement in overall wound dimensions. Areas on his plantar left foot and right foot show no improvement. In fact the right foot looks somewhat worse 08/18/17; left lateral leg. We changed to [facility] Blue last week after a prolonged course of Iodoflex which helps get the surface better. It appears that the wound with is improved. Continue with difficult areas on the left dorsal first second and plantar first second on the right 09/01/17; patient arrives in clinic today having had a temperature of 103 yesterday.  He was seen in the ER and [facility]. The patient was concerned he could have cellulitis again in the right leg however they diagnosed him with a UTI and he is now on Keflex. He has a history of cellulitis which is been recurrent and difficult  but this is been in the left leg, in the past 5 use doxycycline. He does in and out catheterizations at home which are risk factors for UTI 09/08/17; patient will be completing his Keflex this weekend. The erythema on the left leg is considerably better. He has a new wound today on the medial part of the right leg small superficial almost looks like a skin tear. He has worsening of the area on the right dorsal first and second toe. His major area on the left lateral leg is better. Using Hydrofera Blue on all areas 09/15/17; gradual reduction in width on the long wound in the left lateral leg. No debridement required. He also has wounds on the plantar aspect of his left first second toe web space and on the dorsal aspect of the right first second toe web space. 09/22/17; there continues to be very gradual improvements in the dimensions of the left lateral leg wound. He hasn't round erythematous spot with might be pressure on his wheelchair. There is no evidence obviously of infection no purulence no warmth ooHe has a dry scaled area on the plantar aspect of the left first second toe ooImproved area on the dorsal right first second toe. 09/29/17; left lateral leg wound continues to improve in dimensions mostly with an is still a fairly long but increasingly narrow wound. ooHe has a dry scaled area on the plantar aspect of his left first second toe web space ooIncreasingly concerning area on the dorsal right first second toe. In fact I am concerned today about possible cellulitis around this wound. The areas extending up his second toe and although there is deformities here almost appears to abut on the nailbed. 10/06/17; left lateral leg wound continues to make very gradual progress. Tissue culture I did from the right first second toe dorsal foot last time grew MRSA and enterococcus which was vancomycin sensitive. This was not sensitive to clindamycin or doxycycline. He is allergic to Zyvox  and sulfa we have therefore arrange for him to have dalvance infusion tomorrow. He is had this in the past and tolerated it well 10/20/17; left lateral leg wound continues to make decent progress. This is certainly reduced in terms of with there is advancing epithelialization.ooThe cellulitis in the right foot looks better although he still has a deep wound in the dorsal aspect of the first second toe web space. Plantar left first toe web space on the left I think is making some progress 10/27/17; left lateral leg wound continues to make decent progress. Advancing epithelialization.using Hydrofera Blue ooThe right first second toe web space wound is better-looking using silver alginate ooImprovement in the left plantar first second toe web space. Again using silver alginate 11/03/17 left lateral leg wound continues to make decent progress albeit slowly. Using Hydrofera Blue ooThe right per second toe web space continues to be a very problematic looking punched out wound. I obtained a piece of tissue for deep culture I did extensively treated this for fungus. It is difficult to imagine that this is a pressure area as the patient states other than going outside he doesn't really wear shoes at home ooThe left plantar first second toe web space looked fairly senescent. Necrotic edges. This  required debridement oochange to Hydrofera Blue to all wound areas 11/10/17; left lateral leg wound continues to contract. Using Hydrofera Blue ooOn the right dorsal first second toe web space dorsally. Culture I did of this area last week grew MRSA there is not an easy oral option in this patient was multiple antibiotic allergies or intolerances. This was only a rare culture isolate I'm therefore going to use Bactroban under silver alginate ooOn the left plantar first second toe web space. Debridement is required here. This is also unchanged 11/17/17; left lateral leg wound continues to contract using  Hydrofera Blue this is no longer the major issue. ooThe major concern here is the right first second toe web space. He now has an open area going from dorsally to the plantar aspect. There is now wound on the inner lateral part of the first toe. Not a very viable surface on this. There is erythema spreading medially into the forefoot. ooNo major change in the left first second toe plantar wound 11/24/17; left lateral leg wound continues to contract using Hydrofera Blue. Nice improvement today ooThe right first second toe web space all of this looks a lot less angry than last week. I have given him clindamycin and topical Bactroban for MRSA and terbinafine for the possibility of underlining tinea pedis that I could not control with ketoconazole. Looks somewhat better ooThe area on the plantar left first second toe web space is weeping with dried debris around the wound 12/01/17; left lateral leg wound continues to contract he Hydrofera Blue. It is becoming thinner in terms of with nevertheless it is making good improvement. ooThe right first second toe web space looks less angry but still a large necrotic-looking wounds starting on the plantar aspect of the right foot extending between the toes and now extensively on the base of the right second toe. I gave him clindamycin and topical Bactroban for MRSA anterior benefiting for the possibility of underlying tinea pedis. Not looking better today ooThe area on the left first/second toe looks better. Debrided of necrotic debris 12/05/17* the patient was worked in urgently today because over the weekend he found blood on his incontinence bad when he woke up. He was found to have an ulcer by his wife who does most of his wound care. He came in today for [place] to look at this. He has not had a history of wounds in his buttocks in spite of his paraplegia. 12/08/17; seen in follow-up today at his usual appointment. He was seen earlier this week and found to  have a new wound on his buttock. We also follow him for wounds on the left lateral leg, left first second toe web space and right first second toe web space 12/15/17; we have been using Hydrofera Blue to the left lateral leg which has improved. The right first second toe web space has also improved. Left first second toe web space plantar aspect looks stable. The left buttock has worsened using Santyl. Apparently the buttock has drainage 12/22/17; we have been using Hydrofera Blue to the left lateral leg which continues to improve now 2 small wounds separated by normal skin. He tells [place] he had a fever up to 100 yesterday he is prone to UTIs but has not noted anything different. He does in and out catheterizations. The area between the first and second toes today does not look good necrotic surface covered with what looks to be purulent drainage and erythema extending into the third toe. I had  gotten this to something that I thought look better last time however it is not look good today. He also has a necrotic surface over the buttock wound which is expanded. I thought there might be infection under here so I removed a lot of the surface with a #5 curet though nothing look like it really needed culturing. He is been using Santyl to this area 12/27/17; his original wound on the left lateral leg continues to improve using Hydrofera Blue. I gave him samples of Baxdella although he was unable to take them out of fear for an allergic reaction ["lump in his throat"].the culture I did of the purulent drainage from his second toe last week showed both enterococcus and a set Enterobacter I was also concerned about the erythema on the bottom of his foot although paradoxically although this looks somewhat better today. 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 We've been using silver alginate here. They came up today  that he sleeps in a recliner, I'm not sure why but I asked him to stop this 01/03/18; his original wound we've been using Hydrofera Blue is now separated into 2 areas. ooUlcer on his left buttock is better he is off the recliner and sleeping in bed ooFinally both wound areas between his first and second toes also looks some better 01/10/18; his original wound on the left lateral leg is now separated into 2 wounds we've been using Hydrofera Blue ooUlcer on his left buttock has some drainage. There is a small probing site going into muscle layer superiorly.using silver alginate -He arrives today with a deep tissue injury on the left heel ooThe wound on the dorsal aspect of his first second toe on the left looks a lot betterusing silver alginate ketoconazole ooThe area on the first second toe web space on the right also looks a lot bette 01/17/18; his original wound on the left lateral leg continues to progress using Hydrofera Blue ooUlcer on his left buttock also is smaller surface healthier except for a small probing site going into the muscle layer superiorly. 2.4 cm of tunneling in this area ooDTI on his left heel we have only been offloading. Looks better than last week no threatened open no evidence of infection oothe wound on the dorsal aspect of the first second toe on the left continues to look like it's regressing we have only been using silver alginate and terbinafine orally ooThe area in the first second toe web space on the right also looks to be a lot better using silver alginate and terbinafine I think this was prompted by tinea pedis 01/31/18; the patient was hospitalized in [place] last week apparently for a complicated UTI. He was discharged on cefepime he does in and out catheterizations. In the hospital he was discovered M I don't mild elevation of AST and ALTs and the terbinafine was stopped.predictably the pressure ulcer on his buttock looks betterusing  silver alginate. The area on the left lateral leg also is better using Hydrofera Blue. The area between the first and second toes on the left better. First and second toes on the right still substantial but better. Finally the DTI on the left heel has held together and looks like it's resolving 02/07/18-he is here in follow-up evaluation for multiple ulcerations. He has new injury to the lateral aspect of the last issue a pressure ulcer, he states this is from adhesive removal trauma.  He states he has tried multiple adhesive products with no success. All other ulcers appear stable. The left heel DTI is resolving. We will continue with same treatment plan and follow-up next week. 02/14/18; follow-up for multiple areas. ooHe has a new area last week on the lateral aspect of his pressure ulcer more over the posterior trochanter. The original pressure ulcer looks quite stable has healthy granulation. 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 Almost fully epithelialized on the original superior area using Hydrofera Blue ooDTI on the left heel has peeled off this week to reveal a small superficial wound under denuded skin and subcutaneous tissue ooBoth areas between the first and second toes look better including nothing open on the left 02/21/18; ooThe patient's wounds on his left ischial tuberosity and posterior left greater trochanter actually looked better. He has a large area of irritation around the area which I think is contact dermatitis. I am doubtful that this is fungal ooHis original wound on the left lateral calf continues to improve we have been using Hydrofera Blue ooThere is no open area in the left first second toe web space although there is a lot of thick callus ooThe DTI on the left heel required debridement today of necrotic surface eschar and subcutaneous tissue  using silver alginate ooFinally the area on the right first second toe webspace continues to contract using silver alginate and ketoconazole 02/28/18 ooLeft ischial tuberosity wounds look better using silver alginate. ooOriginal wound on the left calf only has one small open area left using Hydrofera Blue ooDTI on the left heel required debridement mostly removing skin from around this wound surface. Using silver alginate ooThe areas on the right first/second toe web space using silver alginate and ketoconazole 03/08/18 on evaluation today patient appears to be doing decently well as best I can tell in regard to his wounds. This is the first time that I have seen him as he generally is followed by Dr. Dellia Nims. With that being said none of his wounds appear to be infected he does have an area where there is some skin covering what appears to be a new wound on the left dorsal surface of his great toe. This is right at the nail bed. With that being said I do believe that debrided away some of the excess skin can be of benefit in this regard. Otherwise he has been tolerating the dressing changes without complication. 03/14/18; patient arrives today with the multiplicity of wounds that we are following. He has not been systemically unwell ooOriginal wound on the left lateral calf now only has 2 small open areas we've been using Hydrofera Blue which should continue ooThe deep tissue injury on the left heel requires debridement today. We've been using silver alginate ooThe left first second toe and the right first second toe are both are reminiscence what I think was tinea pedis. Apparently some of the callus Surface between the toes was removed last week when it started draining. ooPurulent drainage coming from the wound on the ischial tuberosity on the left. 03/21/18-He is here in follow-up evaluation for multiple wounds. 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 He tolerated debridement. The only change to last week's recommendations is to discontinue antifungal cream between toes. He will follow-up next week 03/28/18; following up for multiple wounds;Concern this week is streaking redness and swelling  in the right foot. He is going to need antibiotics for this. 03/31/18; follow-up for right foot cellulitis. Streaking redness and swelling in the right foot on 03/28/18. 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 I put him on clindamycin 300 mg every 6 and brought him in for a quick check. He has an open wound between his first and second toes on the right foot as a potential source. 04/04/18; ooRight foot cellulitis is resolving he is completing clindamycin. This is truly good news ooLeft lateral calf wound which is initial wound only has one small open area inferiorly this is close to healing out. He has compression stockings. We will use Hydrofera Blue right down to the epithelialization of this ooNonviable surface on the left heel which was initially pressure with a DTI. We've been using Hydrofera Blue. I'm going to switch this back to silver alginate ooLeft first second toe/tinea pedis this looks better using silver alginate ooRight first second toe tinea pedis using silver alginate ooLarge pressure ulcers on theLeft ischial tuberosity. Small wound here Looks better. I am uncertain about the surface over the large wound. Using silver alginate 04/11/18; ooCellulitis in the right foot is resolved ooLeft lateral calf wound which was his original wounds still has 2 tiny open areas remaining this is just about closed ooNonviable surface on the left heel is better but still requires debridement ooLeft first second toe/tinea pedis still open using silver alginate ooRight first second toe wound tinea pedis I asked him to go back to using ketoconazole and silver alginate ooLarge  pressure ulcers on the left ischial tuberosity this shear injury here is resolved. Wound is smaller. No evidence of infection using silver alginate 04/18/18; ooPatient arrives with an intense area of cellulitis in the right mid lower calf extending into the right heel area. Bright red and warm. Smaller area on the left anterior leg. He has a significant history of MRSA. He will definitely need antibioticsoodoxycycline ooHe now has 2 open areas on the left ischial tuberosity the original large wound and now a satellite area which I think was above his initial satellite areas. Not a wonderful surface on this satellite area surrounding erythema which looks like pressure related. ooHis left lateral calf wound again his original wound is just about closed ooLeft heel pressure injury still requiring debridement ooLeft first second toe looks a lot better using silver alginate ooRight first second toe also using silver alginate and ketoconazole cream also looks better 04/20/18; the patient was worked in early today out of concerns with his cellulitis on the right leg. I had started him on doxycycline. This was 2 days ago. His wife was concerned about the swelling in the area. Also concerned about the left buttock. He has not been systemically unwell no fever chills. No nausea vomiting or diarrhea 04/25/18; the patient's left buttock wound is continued to deteriorate he is using Hydrofera Blue. He is still completing clindamycin for the cellulitis on the right leg although all of this looks better. 05/02/18 ooLeft buttock wound still with a lot of drainage and a very tightly adherent fibrinous necrotic surface. He has a deeper area superiorly ooThe left lateral calf wound is still closed ooDTI wound on the left heel necrotic surface especially the circumference using Iodoflex ooAreas between his left first second toe and right first second toe both look better. Dorsally and the right  first second toe he had a necrotic surface although at smaller. In using silver alginate and ketoconazole.  I did a culture last week which was a deep tissue culture of the reminiscence of the open wound on the right first second toe dorsally. This grew a few Acinetobacter and a few methicillin-resistant staph aureus. Nevertheless the area actually this week looked better. I didn't feel the need to specifically address this at least in terms of systemic antibiotics. 05/09/18; wounds are measuring larger more drainage per our intake. We are using Santyl covered with alginate on the large superficial buttock wounds, Iodosorb on the left heel, ketoconazole and silver alginate to the dorsal first and second toes bilaterally. 05/16/18; ooThe area on his left buttock better in some aspects although the area superiorly over the ischial tuberosity required an extensive debridement.using Santyl ooLeft heel appears stable. Using Iodoflex ooThe areas between his first and second toes are not bad however there is spreading erythema up the dorsal aspect of his left foot this looks like cellulitis again. He is insensate the erythema is really very brilliant.o Erysipelas He went to see an allergist days ago because he was itching part of this he had lab work done. This showed a white count of 15.1 with 70% neutrophils. Hemoglobin of 11.4 and a platelet count of 659,000. Last white count we had in Epic was a 2-1/2 years ago which was 25.9 but he was ill at the time. He was able to show me some lab work that was done by his primary physician the pattern is about the same. 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 But prompted me to go ahead and do x-rays of both feet and the pelvis rule out osteomyelitis. He also had a comprehensive metabolic panel this was reasonably normal his albumin was 3.7 liver function tests BUN/creatinine all normal 05/23/18; x-rays of both  his feet from last week were negative for underlying pulmonary abnormality. The x-ray of his pelvis however showed mild irregularity in the left ischial which may represent some early osteomyelitis. The wound in the left ischial continues to get deeper clearly now exposed muscle. Each week necrotic surface material over this area. Whereas the rest of the wounds do not look so bad. ooThe left ischial wound we have been using Santyl and calcium alginate ooTo the left heel surface necrotic debris using Iodoflex ooThe left lateral leg is still healed ooAreas on the left dorsal foot and the right dorsal foot are about the same. There is some inflammation on the left which might represent contact dermatitis, fungal dermatitis I am doubtful cellulitis although this looks better than last week 05/30/18; CT scan done at Hospital did not show any osteomyelitis or abscess. Suggested the possibility of underlying cellulitis although I don't see a lot of evidence of this at the bedside ooThe wound itself on the left buttock/upper thigh actually looks somewhat better. No debridement ooLeft heel also looks better no debridement continue Iodoflex ooBoth dorsal first second toe spaces appear better using Lotrisone. Left still required debridement 06/06/18; ooIntake reported some purulent looking drainage from the left gluteal wound. Using Santyl and calcium alginate ooLeft heel looks better although still a nonviable surface requiring debridement ooThe left dorsal foot first/second webspace actually expanding and somewhat deeper. I may consider doing a shave biopsy of this area ooRight dorsal foot first/second webspace appears stable to improved. Using Lotrisone and silver alginate to both these areas 06/13/18 ooLeft gluteal surface looks better. Now separated in the 2 wounds. No debridement required. Still drainage. We'll continue silver  alginate ooLeft heel continues to look better with Iodoflex  continue this for at least another week ooOf his dorsal foot wounds the area on the left still has some depth although it looks better than last week. We've been using Lotrisone and silver alginate 06/20/18 ooLeft gluteal continues to look better healthy tissue ooLeft heel continues to look better healthy granulation wound is smaller. He is using Iodoflex and his long as this continues continue the Iodoflex ooDorsal right foot looks better unfortunately dorsal left foot does not. There is swelling and erythema of his forefoot. He had minor trauma to this several days ago but doesn't think this was enough to have caused any tissue injury. Foot looks like cellulitis, we have had this problem before 06/27/18 on evaluation today patient appears to be doing a little worse in regard to his foot ulcer. 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 Both of which would really be his only options for treating this infection. In the past he has been given and effusion of Orbactiv. This is done very well for him in the past again it's one time dosing IV antibiotic therapy. Subsequently I do believe this is something we're gonna need to see about doing at this point in time. Currently his other wounds seem to be doing somewhat better in my pinion I'm pretty happy in that regard. 07/03/18 on evaluation today patient's wounds actually appear to be doing fairly well. He has been tolerating the dressing changes without complication. All in all he seems to be showing signs of improvement. In regard to the antibiotics he has been dealing with infectious disease since I saw him last week as far as getting this scheduled. In the end [facility]'s [facility] this coming Friday. In the meantime he has been continuing to perform the  dressing changes in such as previous. There does not appear to be any evidence of infection worsengin at this time. 07/10/18; ooSince I last saw this man 2 weeks ago things have actually improved. IV antibiotics of resulted in less forefoot erythema although there is still some present. He is not systemically unwell ooLeft buttock wounds o2 now have no depth there is increased epithelialization Using silver alginate ooLeft heel still requires debridement using Iodoflex ooLeft dorsal foot still with a sizable wound about the size of a border but healthy granulation ooRight dorsal foot still with a slitlike area using silver alginate 07/18/18; the patient's cellulitis in the left foot is improved in fact I think it is on its way to resolving. ooLeft buttock wounds o2 both look better although the larger one has hypertension granulation we've been using silver alginate ooLeft heel has some thick circumferential redundant skin over the wound edge which will need to be removed today we've been using Iodoflex ooLeft dorsal foot is still a sizable wound required debridement using silver alginate ooThe right dorsal foot is just about closed only a small open area remains here 07/25/18; left foot cellulitis is resolved ooLeft buttock wounds o2 both look better. Hyper-granulation on the major area ooLeft heel as some debris over the surface but otherwise looks a healthier wound. Using silver collagen ooRight dorsal foot is just about closed 07/31/18; arrives with our intake nurse worried about purulent drainage from the buttock. We had hyper-granulation here last  week ooHis buttock wounds o2 continue to look better ooLeft heel some debris over the surface but measuring smaller. ooRight dorsal foot unfortunately has openings between the toes ooLeft foot superficial wound looks less aggravated. 08/07/18 ooButtock wounds continue to look better although some of her granulation and the  larger medial wound. silver alginate ooLeft heel continues to look a lot better.silver collagen ooLeft foot superficial wound looks less stable. Requires debridement. He has a new wound superficial area on the foot on the lateral dorsal foot. ooRight foot looks better using silver alginate without Lotrisone 08/14/2018; patient was in the ER last week diagnosed with a UTI. He is now on Cefpodoxime and Macrodantin. ooButtock wounds continued to be smaller. Using silver alginate ooLeft heel continues to look better using silver collagen ooLeft foot superficial wound looks as though it is improving ooRight dorsal foot area is just about healed. 08/21/2018; patient is completed his antibiotics for his UTI. ooHe has 2 open areas on the buttocks. There is still not closed although the surface looks satisfactory. Using silver alginate ooLeft heel continues to improve using silver collagen ooThe bilateral dorsal foot areas which are at the base of his first and second toes/possible tinea pedis are actually stable on the left but worse on the right. The area on the left required debridement of necrotic surface. After debridement I obtained a specimen for PCR culture. ooThe right dorsal foot which is been just about healed last week is now reopened 08/28/2018; culture done on the left dorsal foot showed coag negative staph both staph epidermidis and Lugdunensis. I think this is worthwhile initiating systemic treatment. I will use doxycycline given his long list of allergies. The area on the left heel slightly improved but still requiring debridement. ooThe large wound on the buttock is just about closed whereas the smaller one is larger. Using silver alginate in this area 09/04/2018; patient is completing his doxycycline for the left foot although this continues to be a very difficult wound area with very adherent necrotic debris. We are using silver alginate to all his wounds right foot left  foot and the small wounds on his buttock, silver collagen on the left heel. 09/11/2018; once again this patient has intense erythema and swelling of the left forefoot. Lesser degrees of erythema in the right foot. He has a long list of allergies and intolerances. I will reinstitute doxycycline. 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 Using silver alginate ooLeft heel also looks better using silver collagen ooUnfortunately both the areas on his feet look worse. The area on the left first second webspace is now gone through to the plantar part of his foot. The area on the left foot anteriorly is irritated with erythema and swelling in the forefoot. 09/25/2018 ooHis wound on the left plantar heel looks better. Using silver collagen ooThe area on the left buttock 2 small remnant areas. One is closed one is still open. Using silver alginate ooThe areas between both his first and second toes look worse. This in spite of long-standing antifungal therapy with ketoconazole and silver alginate which should have antifungal activity ooHe has small areas around his original wound on the left calf one is on the bottom of the original scar tissue and one superiorly both of these are small and superficial but again given wound history in this site this is worrisome 10/02/2018 ooLeft plantar heel continues to gradually contract using silver collagen ooLeft buttock wound  is unchanged using silver alginate ooThe areas on his dorsal feet between his first and second toes bilaterally look about the same. 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 Small open area here. We have been using silver alginate 10/09/2018; ooLeft plantar heel continues to look healthy and contract.  No debridement is required ooLeft buttock slightly smaller with a tape injury wound just below which was new this week ooDorsal feet somewhat improved I have been using clindamycin ooLeft lateral looks lower extremity the actual open area looks worse although a lot of this is epithelialized. I am going to change to silver collagen today He has a lot more swelling in the right leg although this is not pitting not red and not particularly warm there is a lot of spasm in the right leg usually indicative of people with paralysis of some underlying discomfort. We have reviewed his vascular status from 2017 he had a left greater saphenous vein ablation. I wonder about referring him back to vascular surgery if the area on the left leg continues to deteriorate. 10/16/2018 in today for follow-up and management of multiple lower extremity ulcers. His left Buttock wound is much lower smaller and almost closed completely. The wound to the left ankle has began to reopen with Epithelialization and some adherent slough. He has multiple new areas to the left foot and leg. The left dorsal foot without much improvement. Wound present between left great webspace and 2nd toe. Erythema and edema present right leg. Right LE ultrasound obtained on 10/10/18 was negative for DVT. 10/23/2018; ooLeft buttock is closed over. Still dry macerated skin but there is no open wound. I suspect this is chronic pressure/moisture ooLeft lateral calf is quite a bit worse than when I saw this last. There is clearly drainage here he has macerated skin into the left plantar heel. We will change the primary dressing to alginate ooLeft dorsal foot has some improvement in overall wound area. Still using clindamycin and silver alginate ooRight dorsal foot about the same as the left using clindamycin and silver alginate ooThe erythema in the right leg has resolved. He is DVT rule out was negative ooLeft heel pressure area required  debridement although the wound is smaller and the surface is health 10/26/2018 ooThe patient came back in for his nurse check today predominantly because of the drainage coming out of the left lateral leg with a recent reopening of his original wound on the left lateral calf. He comes in today with a large amount of surrounding erythema around the wound extending from the calf into the ankle and even in the area on the dorsal foot. He is not systemically unwell. He is not febrile. Nevertheless this looks like cellulitis. We have been using silver alginate to the area. I changed him to a regular visit and I am going to prescribe him doxycycline. The rationale here is a long list of medication intolerances and a history of MRSA. I did not see anything that I thought would provide a valuable culture 10/30/2018 ooFollow-up from his appointment 4 days ago with really an extensive area of cellulitis in the left calf left lateral ankle and left dorsal foot. I put him on doxycycline. He has a long list of medication allergies which are true allergy reactions. 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 In any case he is a lot better today. The erythema especially in the anterior and lateral left calf is better. He still has left ankle erythema. He also is complaining about increasing edema in the right leg we have only been using Kerlix Coban and he has been doing the wraps at home. Finally he has a spotty rash on the medial part of his upper left calf which looks like folliculitis or perhaps wrap occlusion type injury. Small superficial macules not pustules 11/06/18 patient arrives today with again a considerable degree of erythema around the wound on the left lateral calf extending into the dorsal ankle and dorsal foot. This is a lot worse than when I saw this last week. He is on doxycycline really with not a lot of improvement. He has  not been systemically unwell Wounds on the; left heel actually looks improved. Original area on the left foot and proximity to the first and second toes looks about the same. He has superficial areas on the dorsal foot, anterior calf and then the reopening of his original wound on the left lateral calf which looks about the same ooThe only area he has on the right is the dorsal webspace first and second which is smaller. ooHe has a large area of dry erythematous skin on the left buttock small open area here. 11/13/2018; the patient arrives in much better condition. The erythema around the wound on the left lateral calf is a lot better. Not sure whether this was the clindamycin or the TCA and ketoconazole or just in the improvement in edema control [stasis dermatitis]. In any case this is a lot better. The area on the left heel is very small and just about resolved using silver collagen we have been using silver alginate to the areas on his dorsal feet 11/20/2018; his wounds include the left lateral calf, left heel, dorsal aspects of both feet just proximal to the first second webspace. He is stable to slightly improved. I did not think any changes to his dressings were going to be necessary 11/27/2018 he has a reopening on the left buttock which is surrounded by what looks like tinea or perhaps some other form of dermatitis. The area on the left dorsal foot has some erythema around it I have marked this area but I am not sure whether this is cellulitis or not. Left heel is not closed. Left calf the reopening is really slightly longer and probably worse 1/13; in general things look better and smaller except for the left dorsal foot. Area on the left heel is just about closed, left buttock looks better only a small wound remains in the skin looks better [using Lotrisone] 1/20; the area on the left heel only has a few remaining open areas here. Left lateral calf about the same in terms of size, left  dorsal foot slightly larger right lateral foot still not closed. The area on the left buttock has no open wound and the surrounding skin looks a lot better 1/27; the area on the left heel is closed. Left lateral calf better but still requiring extensive debridements. The area on his left buttock is closed. He still has the open areas on the left dorsal foot which is slightly smaller in the right foot which is slightly expanded. We have been using Iodoflex on these areas as well 2/3; left heel is closed. Left lateral calf still requiring debridement using Iodoflex there is no open area on  his left buttock however he has dry scaly skin over a large area of this. Not really responding well to the Lotrisone. Finally the areas on his dorsal feet at the level of the first second webspace are slightly smaller on the right and about the same on the left. Both of these vigorously debrided with Anasept and gauze 2/10; left heel remains closed he has dry erythematous skin over the left buttock but there is no open wound here. Left lateral leg has come in and with. Still requiring debridement we have been using Iodoflex here. Finally the area on the left dorsal foot and right dorsal foot are really about the same extremely dry callused fissured areas. He does not yet have a dermatology appointment 2/17; left heel remains closed. He has a new open area on the left buttock. The area on the left lateral calf is bigger longer and still covered in necrotic debris. No major change in his foot areas bilaterally. I am awaiting for a dermatologist to look on this. We have been using ketoconazole I do not know that this is been doing any good at all. 2/24; left heel remains closed. The left buttock wound that was new reopening last week looks better. The left lateral calf appears better also although still requires debridement. The major area on his foot is the left first second also requiring debridement. We have been  putting Prisma on all wounds. I do not believe that the ketoconazole has done too much good for his feet. He will use Lotrisone I am going to give him a 2-week course of terbinafine. We still do not have a dermatology appointment 3/2 left heel remains closed however there is skin over bone in this area I pointed this out to him today. The left buttock wound is epithelialized but still does not look completely stable. The area on the left leg required debridement were using silver collagen here. With regards to his feet we changed to Lotrisone last week and silver alginate. 3/9; left heel remains closed. Left buttock remains closed. The area on the right foot is essentially closed. The left foot remains unchanged. Slightly smaller on the left lateral calf. Using silver collagen to both of these areas 3/16-Left heel remains closed. Area on right foot is closed. Left lateral calf above the lateral malleolus open wound requiring debridement with easy bleeding. Left dorsal wound proximal to first toe also debrided. Left ischial area open new. Patient has been using Prisma with wrapping every 3 days. Dermatology appointment is apparently tomorrow.Patient has completed his terbinafine 2-week course with some apparent improvement according to him, there is still flaking and dry skin in his foot on the left 3/23; area on the right foot is reopened. The area on the left anterior foot is about the same still a very necrotic adherent surface. He still has the area on the left leg and reopening is on the left buttock. He apparently saw dermatology although I do not have a note. According to the patient who is usually fairly well informed they did not have any good ideas. Put him on oral terbinafine which she is been on before. 3/30; using silver collagen to all wounds. Apparently his dermatologist put him on doxycycline and rifampin presumably some culture grew staph. I do not have this result. He remains on  terbinafine although I have used terbinafine on him before 4/6; patient has had a fairly substantial reopening on the right foot between the first and second toes. He  is finished his terbinafine and I believe is on doxycycline and rifampin still as prescribed by dermatology. We have been using silver collagen to all his wounds although the patient reports that he thinks silver alginate does better on the wounds on his buttock. 4/13; the area on his left lateral calf about the same size but it did not require debridement. ooLeft dorsal foot just proximal to the webspace between the first and second toes is about the same. Still nonviable surface. I note some superficial bronze discoloration of the dorsal part of his foot ooRight dorsal foot just proximal to the first and second toes also looks about the same. I still think there may be the same discoloration I noted above on the left ooLeft buttock wound looks about the same 4/20; left lateral calf appears to be gradually contracting using silver collagen. 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 The left dorsal foot wound is debrided of tightly adherent necrotic debris and really cleans up quite nicely. The right area is worse with expansion. I did not debride this it is now over the base of the second toe ooThe area on his left buttock is smaller no debridement is required using silver collagen 5/4; left calf continues to make good progress. ooHe arrives with erythema around the wounds on his dorsal foot which even extends to the plantar aspect. Very concerning for coexistent infection. He is finished the erythromycin I gave him for possible erythrasma this does not seem to have helped. ooThe area on the left foot is about the same base of the dorsal toes ooIs area on the buttock looks improved on the left 5/11; left calf and left buttock continued to make good progress. Left  foot is about the same to slightly improved. ooMajor problem is on the right foot. He has not had an x-ray. Deep tissue culture I did last week showed both Enterobacter and E. coli. I did not change the doxycycline I put him on empirically although neither 1 of these were plated to doxycycline. He arrives today with the erythema looking worse on both the dorsal and plantar foot. Macerated skin on the bottom of the foot. he has not been systemically unwell 5/18-Patient returns at 1 week, left calf wound appears to be making some progress, left buttock wound appears slightly worse than last time, left foot wound looks slightly better, right foot redness is marginally better. X-ray of both feet show no air or evidence of osteomyelitis. Patient is finished his Omnicef and terbinafine. He continues to have macerated skin on the bottom of the left foot as well as right 5/26; left calf wound is better, left buttock wound appears to have multiple small superficial open areas with surrounding macerated skin. X-rays that I did last time showed no evidence of osteomyelitis in either foot. He is finished cefdinir and doxycycline. I do not think that he was on terbinafine. He continues to have a large superficial open area on the right foot anterior dorsal and slightly between the first and second toes. I did send him to dermatology 2 months ago or so wondering about whether they would do a fungal scraping. I do not believe they did but did do a culture. We have been using silver alginate to the toe areas, he has been using antifungals at home topically either ketoconazole or Lotrisone. We are using silver collagen on the left foot, silver alginate on the right, silver collagen on the left lateral  leg and silver alginate on the left buttock 6/1; left buttock area is healed. We have the left dorsal foot, left lateral leg and right dorsal foot. We are using silver alginate to the areas on both feet and silver  collagen to the area on his left lateral calf 6/8; the left buttock apparently reopened late last week. He is not really sure how this happened. He is tolerating the terbinafine. Using silver alginate to all wounds 6/15; left buttock wound is larger than last week but still superficial. ooCame in the clinic today with a report of purulence from the left lateral leg I did not identify any infection ooBoth areas on his dorsal feet appear to be better. He is tolerating the terbinafine. Using silver alginate to all wounds 6/22; left buttock is about the same this week, left calf quite a bit better. His left foot is about the same however he comes in with erythema and warmth in the right forefoot once again. Culture that I gave him in the beginning of May showed Enterobacter and E. coli. I gave him doxycycline and things seem to improve although neither 1 of these organisms was specifically plated. 6/29; left buttock is larger and dry this week. Left lateral calf looks to me to be improved. Left dorsal foot also somewhat improved right foot completely unchanged. The erythema on the right foot is still present. He is completing the Ceftin dinner that I gave him empirically [see discussion above.) 7/6 - All wounds look to be stable and perhaps improved, the left buttock wound is slightly smaller, per patient bleeds easily, completed ceftin, the right foot redness is less, he is on terbinafine 7/13; left buttock wound about the same perhaps slightly narrower. Area on the left lateral leg continues to narrow. Left dorsal foot slightly smaller right foot about the same. We are using silver alginate on the right foot and Hydrofera Blue to the areas on the left. Unna boot on the left 2 layer compression on the right 7/20; left buttock wound absolutely the same. Area on lateral leg continues to get better. Left dorsal foot require debridement as did the right no major change in the 7/27; left buttock wound  the same size necrotic debris over the surface. The area on the lateral leg is closed once again. His left foot looks better right foot about the same although there is some involvement now of the posterior first second toe area. He is still on terbinafine which I have given him for a month, not certain a centimeter major change 06/25/19-All wounds appear to be slightly improved according to report, left buttock wound looks clean, both foot wounds have minimal to no debris the right dorsal foot has minimal slough. We are using Hydrofera Blue to the left and silver alginate to the right foot and ischial wound. 8/10-Wounds all appear to be around the same, the right forefoot distal part has some redness which was not there before, however the wound looks clean and small. Ischial wound looks about the same with no changes 8/17; his wound on the left lateral calf which was his original chronic venous insufficiency wound remains closed. Since I last saw him the areas on the left dorsal foot right dorsal foot generally appear better but require debridement. The area on his left initial tuberosity appears somewhat larger to me perhaps hyper granulated and bleeds very easily. We have been using Hydrofera Blue to the left dorsal foot and silver alginate to everything else 8/24; left  lateral calf remains closed. The areas on his dorsal feet on the webspace of the first and second toes bilaterally both look better. The area on the left buttock which is the pressure ulcer stage II slightly smaller. I change the dressing to Hydrofera Blue to all areas 8/31; left lateral calf remains closed. The area on his dorsal feet bilaterally look better. Using Hydrofera Blue. Still requiring debridement on the left foot. No change in the left buttock pressure ulcers however 9/14; left lateral calf remains closed. Dorsal feet look quite a bit better than 2 weeks ago. Flaking dry skin also a lot better with the ammonium  lactate I gave him 2 weeks ago. The area on the left buttock is improved. He states that his Roho cushion developed a leak and he is getting a new one, in the interim he is offloading this vigorously 9/21; left calf remains closed. Left heel which was a possible DTI looks better this week. He had macerated tissue around the left dorsal foot right foot looks satisfactory and improved left buttock wound. I changed his dressings to his feet to silver alginate bilaterally. Continuing Hydrofera Blue on the left buttock. 9/28 left calf remains closed. Left heel did not develop anything [possible DTI] dry flaking skin on the left dorsal foot. Right foot looks satisfactory. Improved left buttock wound. We are using silver alginate on his feet Hydrofera Blue on the buttock. I have asked him to go back to the Lotrisone on his feet including the wounds and surrounding areas 10/5; left calf remains closed. The areas on the left and right feet about the same. A lot of this is epithelialized however debris over the remaining open areas. He is using Lotrisone and silver alginate. The area on the left buttock using Hydrofera Blue 10/26. Patient has been out for 3 weeks secondary to Covid concerns. He tested negative but I think his wife tested positive. He comes in today with the left foot substantially worse, right foot about the same. Even more concerning he states that the area on his left buttock closed over but then reopened and is considerably deeper in one aspect than it was before [stage III wound] 11/2; left foot really about the same as last week. Quarter sized wound on the dorsal foot just proximal to the first second toes. Surrounding erythema with areas of denuded epithelium. This is not really much different looking. Did not look like cellulitis this time however. ooRight foot area about the same.. We have been using silver alginate alginate on his toes ooLeft buttock still substantial irritated  skin around the wound which I think looks somewhat better. We have been using Hydrofera Blue here. 11/9; left foot larger than last week and a very necrotic surface. Right foot I think is about the same perhaps slightly smaller. Debris around the circumference also addressed. Unfortunately on the left buttock there is been a decline. Satellite lesions below the major wound distally and now a an additional one posteriorly we have been using Hydrofera Blue but I think this is a pressure issue 11/16; left foot ulcer dorsally again a very adherent necrotic surface. Right foot is about the same. Not much change in the pressure ulcer on his left buttock. 11/30; left foot ulcer dorsally basically the same as when I saw him 2 weeks ago. Very adherent fibrinous debris on the wound surface. Patient reports a lot of drainage as well. The character of this wound has changed completely although it has always been  refractory. We have been using Iodoflex, patient changed back to alginate because of the drainage. Area on his right dorsal foot really looks benign with a healthier surface certainly a lot better than on the left. Left buttock wounds all improved using Hydrofera Blue 12/7; left dorsal foot again no improvement. Tightly adherent debris. PCR culture I did last week only showed likely skin contaminant. I have gone ahead and done a punch biopsy of this which is about the last thing in terms of investigations I can think to do. He has known venous insufficiency and venous hypertension and this could be the issue here. The area on the right foot is about the same left buttock slightly worse according to our intake nurse secondary to [facility] Blue sticking to the wound 12/14; biopsy of the left foot that I did last time showed changes that could be related to wound healing/chronic stasis dermatitis phenomenon no neoplasm. We have been using silver alginate to both feet. I change the one on the left today  to Sorbact and silver alginate to his other 2 wounds 12/28; the patient arrives with the following problems; ooMajor issue is the dorsal left foot which continues to be a larger deeper wound area. Still with a completely nonviable surface ooParadoxically the area mirror image on the right on the right dorsal foot appears to be getting better. ooHe had some loss of dry denuded skin from the lower part of his original wound on the left lateral calf. Some of this area looked a little vulnerable and for this reason we put him in wrap that on this side this week ooThe area on his left buttock is larger. He still has the erythematous circular area which I think is a combination of pressure, sweat. This does not look like cellulitis or fungal dermatitis 11/26/2019; -Dorsal left foot large open wound with depth. Still debris over the surface. Using Sorbact ooThe area on the dorsal right foot paradoxically has closed over [facility] has a reopening on the left ankle laterally at the base of his original wound that extended up into the calf. This appears clean. ooThe left buttock wound is smaller but with very adherent necrotic debris over the surface. We have been using silver alginate here as well The patient had arterial studies done in 2017. He had biphasic waveforms at the dorsalis pedis and posterior tibial bilaterally. ABI in the left was 1.17. Digit waveforms were dampened. He has slight spasticity in the great toes I do not think a TBI would be possible 1/11; the patient comes in today with a sizable reopening between the first and second toes on the right. This is not exactly in the same location where we have been treating wounds previously. According to our intake nurse this was actually fairly deep but 0.6 cm. The area on the left dorsal foot looks about the same the surface is somewhat cleaner using Sorbact, his MRI is in 2 days. We have not managed yet to get arterial studies. The new  reopening on the left lateral calf looks somewhat better using alginate. The left buttock wound is about the same using alginate 1/18; the patient had his ARTERIAL studies which were quite normal. ABI in the right at 1.13 with triphasic/biphasic waveforms on the left ABI 1.06 again with triphasic/biphasic waveforms. It would not have been possible to have done a toe brachial index because of spasticity. We have been using Sorbac to the left foot alginate to the rest of his wounds on  the right foot left lateral calf and left buttock 1/25; arrives in clinic with erythema and swelling of the left forefoot worse over the first MTP area. This extends laterally dorsally and but also posteriorly. Still has an area on the left lateral part of the lower part of his calf wound it is eschared and clearly not closed. ooArea on the left buttock still with surrounding irritation and erythema. ooRight foot surface wound dorsally. The area between the right and first and second toes appears better. 2/1; ooThe left foot wound is about the same. Erythema slightly better I gave him a week of doxycycline empirically ooRight foot wound is more extensive extending between the toes to the plantar surface ooLeft lateral calf really no open surface on the inferior part of his original wound however the entire area still looks vulnerable ooAbsolutely no improvement in the left buttock wound required debridement. 2/8; the left foot is about the same. Erythema is slightly improved I gave him clindamycin last week. ooRight foot looks better he is using Lotrimin and silver alginate ooHe has a breakdown in the left lateral calf. Denuded epithelium which I have removed ooLeft buttock about the same were using Hydrofera Blue 2/15; left foot is about the same there is less surrounding erythema. Surface still has tightly adherent debris which I have debriding however not making any progress ooRight foot has a  substantial wound on the medial right second toe between the first and second webspace. ooStill an open area on the left lateral calf distal area. ooButtock wound is about the same 2/22; left foot is about the same less surrounding erythema. Surface has adherent debris. Polymen Ag Right foot area significant wound between the first and second toes. We have been using silver alginate here Left lateral leg polymen Ag at the base of his original venous insufficiency wound ooLeft buttock some improvement here 3/1; ooRight foot is deteriorating in the first second toe webspace. Larger and more substantial. We have been using silver alginate. ooLeft dorsal foot about the same markedly adherent surface debris using PolyMem Ag ooLeft lateral calf surface debris using PolyMem AG ooLeft buttock is improved again using PolyMem Ag. ooHe is completing his terbinafine. The erythema in the foot seems better. He has been on this for 2 weeks 3/8; no improvement in any wound area in fact he has a small open area on the dorsal midfoot which is new this week. He has not gotten his foot x-rays yet 3/15; his x-rays were both negative for osteomyelitis of both feet. No major change in any of his wounds on the extremities however his buttock wounds are better. We have been using polymen on the buttocks, left lower leg. Iodoflex on the left foot and silver alginate on the right 3/22; arrives in clinic today with the 2 major issues are the improvement in the left dorsal foot wound which for once actually looks healthy with a nice healthy wound surface without debridement. Using Iodoflex here. Unfortunately on the left lateral calf which is in the distal part of his original wound he came to the clinic here for there was purulent drainage noted some increased breakdown scattered around the original area and a small area proximally. We we are using polymen here will change to silver alginate today. His buttock  wound on the left is better and I think the area on the right first second toe webspace is also improved Objective Constitutional Sitting or standing Blood Pressure is within target range for patient.Marland Kitchen  Pulse regular and within target range for patient.Marland Kitchen Respirations regular, non-labored and within target range.. Temperature is normal and within the target range for the patient.Marland Kitchen Appears in no distress. Vitals Time Taken: 7:43 AM, Height: 70 in, Weight: 216 lbs, BMI: 31, Temperature: 98 F, Pulse: 105 bpm, Respiratory Rate: 18 breaths/min, Blood Pressure: 133/71 mmHg. General Notes: Wound exam; debridement of the left lateral ankle which has a completely nonviable surface. There is slight erythema here. Of course it is pointless to examine anything else since he is insensate. He has superficial breakdowns around the original area as well as a new breakdown slightly superiorly ooThe left dorsal foot is not smaller but the surface of the wound looks a lot better. Using Iodoflex here ooBetween the first and second toes the wound actually looks somewhat better we have been using silver alginate ooQuite a bit of improvement in the left buttock wound. Integumentary (Hair, Skin) Wound #24 status is Open. Original cause of wound was Gradually Appeared. The wound is located on the Left,Dorsal Foot. The wound measures 2.5cm length x 2.7cm width x 0.8cm depth; 5.301cm^2 area and 4.241cm^3 volume. There is Fat Layer (Subcutaneous Tissue) Exposed exposed. There is no tunneling or undermining noted. There is a medium amount of serosanguineous drainage noted. The wound margin is distinct with the outline attached to the wound base. There is medium (34-66%) red granulation within the wound bed. There is a medium (34-66%) amount of necrotic tissue within the wound bed including Adherent Slough. Wound #35 status is Open. Original cause of wound was Gradually Appeared. The wound is located on the  Left Ischium. The wound measures 2.7cm length x 0.4cm width x 0.1cm depth; 0.848cm^2 area and 0.085cm^3 volume. There is Fat Layer (Subcutaneous Tissue) Exposed exposed. There is no tunneling or undermining noted. There is a medium amount of serosanguineous drainage noted. The wound margin is flat and intact. There is large (67-100%) red, friable granulation within the wound bed. There is a small (1-33%) amount of necrotic tissue within the wound bed including Adherent Slough. Wound #37 status is Open. Original cause of wound was Gradually Appeared. The wound is located on the Left,Lateral Malleolus. The wound measures 4.2cm length x 2cm width x 0.1cm depth; 6.597cm^2 area and 0.66cm^3 volume. There is Fat Layer (Subcutaneous Tissue) Exposed exposed. There is no tunneling or undermining noted. There is a large amount of purulent drainage noted. The wound margin is flat and intact. There is small (1-33%) pink granulation within the wound bed. There is a large (67-100%) amount of necrotic tissue within the wound bed including Adherent Slough. Wound #38 status is Open. Original cause of wound was Gradually Appeared. The wound is located on the Right Toe - Web between 1st and 2nd. The wound measures 2.3cm length x 0.3cm width x 0.2cm depth; 0.542cm^2 area and 0.108cm^3 volume. There is Fat Layer (Subcutaneous Tissue) Exposed exposed. There is no tunneling or undermining noted. There is a medium amount of serosanguineous drainage noted. The wound margin is flat and intact. There is large (67-100%) red granulation within the wound bed. There is a small (1-33%) amount of necrotic tissue within the wound bed including Adherent Slough. Wound #40 status is Open. Original cause of wound was Gradually Appeared. The wound is located on the Left,Lateral Lower Leg. The wound measures 1cm length x 0.5cm width x 0.1cm depth; 0.393cm^2 area and 0.039cm^3 volume. There is no tunneling or undermining noted. There is  a medium amount of serosanguineous drainage noted. The wound  margin is distinct with the outline attached to the wound base. There is small (1-33%) pink granulation within the wound bed. There is a large (67-100%) amount of necrotic tissue within the wound bed including Adherent Slough. 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 There was a Excisional Skin/Subcutaneous Tissue Debridement with a total area of 8.4 sq cm performed by Ricard Dillon., MD. With the following instrument(s): Curette to remove Viable and Non-Viable tissue/material. Material removed includes Subcutaneous Tissue and Slough and. No specimens were taken. A time out was conducted at 08:13, prior to the start of the procedure. A Minimum amount of bleeding was controlled with Pressure. The procedure was tolerated well with a pain level of 0 throughout and a pain level of 0 following the procedure. Post Debridement Measurements: 4.2cm length x 2cm width x 0.1cm depth; 0.66cm^3 volume. Character of Wound/Ulcer Post Debridement is improved. Severity of Tissue Post Debridement is: Fat layer exposed. 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 There was a Four Layer Compression Therapy Procedure by Levan Hurst, RN. Post procedure Diagnosis Wound #37: Same as Pre-Procedure Wound #40 Pre-procedure diagnosis of Wound #40  is a Venous Leg Ulcer located on the Left,Lateral Lower Leg . There was a Four Layer Compression Therapy Procedure by Levan Hurst, RN. Post procedure Diagnosis Wound #40: Same as Pre-Procedure Plan Follow-up Appointments: Return Appointment in 1 week. Dressing Change Frequency: Wound #24 Left,Dorsal Foot: Do not change entire dressing for one week. Wound #35 Left Ischium: Change Dressing every other day. Wound #37 Left,Lateral Malleolus: Do not change entire dressing for one week. Wound #38 Right Toe - Web between 1st and 2nd: Change Dressing every other day. Skin Barriers/Peri-Wound Care: Antifungal cream - on toes on both feet daily Barrier cream - to leg/ankle and left foot Moisturizing lotion - both legs Other: - Triamcinolone cream Primary Wound Dressing: Wound #24 Left,Dorsal Foot: Iodoflex Wound #35 Left Ischium: Polymem Silver Wound #37 Left,Lateral Malleolus: Calcium Alginate with Silver Wound #40 Left,Lateral Lower Leg: Calcium Alginate with Silver Wound #38 Right Toe - Web between 1st and 2nd: Calcium Alginate with Silver Secondary Dressing: Wound #24 Left,Dorsal Foot: Dry Gauze Heel Cup - add heel cup to left heel for protection. Wound #35 Left Ischium: Foam Border - or ABD pad and tape Wound #37 Left,Lateral Malleolus: Dry Gauze ABD pad Wound #40 Left,Lateral Lower Leg: Dry Gauze ABD pad Wound #38 Right Toe - Web between 1st and 2nd: Kerlix/Rolled Gauze - secure with tape Dry Gauze Edema Control: 4 layer compression: Left lower extremity Elevate legs to the level of the heart or above for 30 minutes daily and/or when sitting, a frequency of: - throughout the day Support Garment 30-40 mm/Hg pressure to: - Juxtalite to right leg Off-Loading: Low air-loss mattress (Group 2) Roho cushion for wheelchair Turn and reposition every 2 hours - out of wheelchair throughout the day, try to lay on sides, sleep in the bed not the recliner Laboratory  ordered  were: Anerobic culture - left lateral malleolus 1. I change the primary dressing on the left lateral leg to silver alginate 2. CandS done of the area post debridement but no empiric antibiotics 3. Continuing with Iodoflex to the left foot and silver alginate to the right. Both of these some improvement. 4. Continue polymen to the left buttock. 5. Vein and vascular appointment, they left him a message she is to call them back Electronic Signature(s) Signed: 02/11/2020 5:34:39 PM By: Linton Ham MD Entered By: Linton Ham on 02/11/2020 08:23:19 -------------------------------------------------------------------------------- SuperBill Details Patient Name: Date of Service: [name] 02/11/2020 Medical Record [id number] Patient Account Number: [id number] Date of Birth/Sex: Treating RN: May 09, 1986 (34 y.o. [name] Primary Care Provider: [place], [place] Other Clinician: Referring Provider: Treating Provider/Extender:Ronold Hardgrove, [name], [name]: 214 Diagnosis Coding ICD-10 Codes Code Description L97.521 Non-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 L97.511 Non-pressure chronic ulcer of other part of right foot limited to breakdown of skin 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, complete 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, stage 3 L97.321 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 to breakdown of skin L03.116 Cellulitis of left lower limb Facility Procedures CPT4 Code Description: [id number] - DEB SUBQ TISSUE 20 SQ CM/< ICD-10 Diagnosis Description L97.321 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 to break Modifier: down of skin Quantity: 1 Physician Procedures CPT4 Code Description: [id number] - WC PHYS SUBQ TISS 20 SQ CM ICD-10 Diagnosis Description L97.321 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 to break Modifier: down of skin Quantity: 1 Electronic Signature(s) Signed: 02/11/2020 5:34:39  PM By: Linton Ham MD Entered By: Linton Ham on 02/11/2020 08:23:43

## 2020-02-11 NOTE — Progress Notes (Addendum)
Heatwole, DRAKEN MCKINEY ([id number]) Visit Report for 02/11/2020 Arrival Information Details Patient Name: Date of Service: KEALAN, DINTINO 02/11/2020 7:30 AM Medical Record [id number] Patient Account Number: [id number] Date of Birth/Sex: Treating RN: 1986-06-18 (34 y.o. [name] Primary Care Amberrose Friebel: [place], [place] Other Clinician: Referring Kenan Moodie: Treating Domini Vandehei/Extender:Robson, [name], [name]: 214 Visit Information History Since Last Visit All ordered tests and consults were completed: No Patient Arrived: Wheel Chair Added or deleted any medications: No Arrival Time: 07:42 Any new allergies or adverse reactions: No Accompanied By: self Had a fall or experienced change in No activities of daily living that may affect Transfer Assistance: None risk of falls: Patient Requires Transmission-Based No Signs or symptoms of abuse/neglect since last No Precautions: visito Patient Has Alerts: Yes Hospitalized since last visit: No Patient Alerts: R ABI = Implantable device outside of the clinic excluding No 1.0 cellular tissue based products placed in the center L ABI = since last visit: 1.1 Has Dressing in Place as Prescribed: Yes Has Compression in Place as Prescribed: Yes Pain Present Now: No Notes per patient received a voicemail from [facility]. Per patient to call them back. Electronic Signature(s) Signed: 02/11/2020 5:27:21 PM By: Deon Pilling Entered By: Deon Pilling on 02/11/2020 07:45:47 -------------------------------------------------------------------------------- Compression Therapy Details Patient Name: Date of Service: CRIBBTheodric, Garbo 02/11/2020 7:30 AM Medical Record [id number] Patient Account Number: [id number] Date of Birth/Sex: Treating RN: [birth year] (34 y.o. [name] Primary Care Carrel Leather: [place], [place] Other Clinician: Referring Latravion Graves: Treating Jowan Skillin/Extender:Robson, [name],  [name]: 214 Compression Therapy Performed for Wound Wound #37 Left,Lateral Malleolus Assessment: Performed By: Clinician Levan Hurst, RN Compression Type: Four Layer Post Procedure Diagnosis Same as Pre-procedure Electronic Signature(s) Signed: 02/11/2020 5:18:21 PM By: Levan Hurst RN, BSN Entered By: Levan Hurst on 02/11/2020 08:16:14 -------------------------------------------------------------------------------- Compression Therapy Details Patient Name: Date of Service: Graddy, [name] 02/11/2020 7:30 AM Medical Record [id number] Patient Account Number: [id number] Date of Birth/Sex: Treating RN: 11-Oct-1986 (34 y.o. [name] Primary Care Exa Bomba: Elias-Fela Solis, [place] Other Clinician: Referring Rylynn Kobs: Treating Sarye Kath/Extender:Robson, [name], [name]: 214 Compression Therapy Performed for Wound Wound #40 Left,Lateral Lower Leg Assessment: Performed By: Clinician Levan Hurst, RN Compression Type: Four Layer Post Procedure Diagnosis Same as Pre-procedure Electronic Signature(s) Signed: 02/11/2020 5:18:21 PM By: Levan Hurst RN, BSN Entered By: Levan Hurst on 02/11/2020 08:16:14 -------------------------------------------------------------------------------- Encounter Discharge Information Details Patient Name: Date of Service: Scharnhorst, [name] 02/11/2020 7:30 AM Medical Record [id number] Patient Account Number: [id number] Date of Birth/Sex: Treating RN: 02-Jan-1986 (34 y.o. [name] Primary Care Sears Oran: [name], [name] Other Clinician: Referring Lygia Olaes: Treating Ayen Viviano/Extender:Robson, [name], [name]: 214 Encounter Discharge Information Items Post Procedure Vitals Discharge Condition: Stable Temperature (F): 98 Ambulatory Status: Wheelchair Pulse (bpm): 105 Discharge Destination: Home Respiratory Rate (breaths/min): 18 Transportation: Private Auto Blood  Pressure (mmHg): 133/71 Accompanied By: self Schedule Follow-up Appointment: Yes Clinical Summary of Care: Electronic Signature(s) Signed: 02/11/2020 5:27:21 PM By: Deon Pilling Entered By: Deon Pilling on 02/11/2020 08:35:22 -------------------------------------------------------------------------------- Lower Extremity Assessment Details Patient Name: Date of Service: CRIBBRaheen, Wilmoth 02/11/2020 7:30 AM Medical Record [id number] Patient Account Number: [id number] Date of Birth/Sex: Treating RN: 1986/10/05 (34 y.o. [name] Primary Care Granvil Djordjevic: [place], [place] Other Clinician: Referring Brennen Camper: Treating Gus Littler/Extender:Robson, [name], [name]: 214 Edema Assessment Assessed: [Left: Yes] [Right: Yes] Edema: [Left: Yes] [Right: Yes] Calf Left: Right: Point of Measurement: 33 cm From Medial Instep 26 cm  31 cm Ankle Left: Right: Point of Measurement: 10 cm From Medial Instep 23 cm 22 cm Electronic Signature(s) Signed: 02/11/2020 5:27:21 PM By: Deon Pilling Entered By: Deon Pilling on 02/11/2020 07:49:53 -------------------------------------------------------------------------------- Multi Wound Chart Details Patient Name: Date of Service: [name]. 02/11/2020 7:30 AM Medical Record [id number] Patient Account Number: [id number] Date of Birth/Sex: Treating RN: 08-Nov-1986 (34 y.o. [name] Primary Care Mishelle Hassan: [name], [name] Other Clinician: Referring Ariyona Eid: Treating Alonzo Owczarzak/Extender:Robson, [name], [name]: 214 Vital Signs Height(in): 70 Pulse(bpm): 105 Weight(lbs): 216 Blood Pressure(mmHg): 133/71 Body Mass Index(BMI): 31 Temperature(F): 98 Respiratory 18 Rate(breaths/min): Photos: [24:No Photos] [35:No Photos] [37:No Photos] Wound Location: [24:Left Foot - Dorsal] [35:Left Ischium] [37:Left Malleolus - Lateral] Wounding Event: [24:Gradually Appeared] [35:Gradually Appeared]  [37:Gradually Appeared] Primary Etiology: [24:Inflammatory] [35:Pressure Ulcer] [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] Secondary Etiology: [24:N/A] [35:N/A] [37:N/A] Comorbid History: [24:Sleep Apnea, Hypertension, Sleep Apnea, Hypertension, Sleep Apnea, Hypertension, Paraplegia] [35:Paraplegia] [37:Paraplegia] Date Acquired: [24:03/08/2018] [35:04/28/2019] [37:11/26/2019] Weeks of Treatment: [24:100] [35:41] [37:11] Wound Status: [24:Open] [35:Open] [37:Open] Clustered Wound: [24:Yes] [35:Yes] [37:No] Clustered Quantity: [24:1] [35:2] [37:N/A] Measurements L x W x D 2.5x2.7x0.8 [35:2.7x0.4x0.1] [37:4.2x2x0.1] (cm) Area (cm) : [24:5.301] [35:0.848] [37:6.597] Volume (cm) : [24:4.241] [35:0.085] [37:0.66] % Reduction in Area: [24:-2146.20%] [35:94.00%] [37:-66.70%] % Reduction in Volume: -17570.80% [35:94.00%] [37:-66.70%] Classification: [24:Full Thickness Without Exposed Support Structures] [35:Category/Stage III] [37:Full Thickness Without Exposed Support Structures] Exudate Amount: [24:Medium] [35:Medium] [37:Large] Exudate Type: [24:Serosanguineous] [35:Serosanguineous] [37:Purulent] Exudate Color: [24:red, brown] [35:red, brown] [37:yellow, brown, green] Wound Margin: [24:Distinct, outline attached Flat and Intact] [37:Flat and Intact] Granulation Amount: [24:Medium (34-66%)] [35:Large (67-100%)] [37:Small (1-33%)] Granulation Quality: [24:Red] [35:Red, Friable] [37:Pink] Necrotic Amount: [24:Medium (34-66%)] [35:Small (1-33%)] [37:Large (67-100%)] Exposed Structures: [24:Fat Layer (Subcutaneous Fat Layer (Subcutaneous Fat Layer (Subcutaneous Tissue) Exposed: Yes Fascia: No Tendon: No Muscle: No Joint: No Bone: No] [35:Tissue) Exposed: Yes Fascia: No Tendon: No Muscle: No Joint: No Bone: No] [37:Tissue) Exposed: Yes  Fascia: No Tendon: No Muscle: No Joint: No Bone: No] Epithelialization: [24:Small (1-33%)] [35:Large (67-100%)] [37:Small (1-33%)] Debridement: [24:N/A] [35:N/A] [37:Debridement -  Excisional] Pre-procedure [24:N/A] [35:N/A] [37:08:13] Verification/Time Out Taken: Tissue Debrided: [24:N/A] [35:N/A] [37:Subcutaneous, Slough] Level: [24:N/A] [35:N/A] [37:Skin/Subcutaneous Tissue] Debridement Area (sq cm):N/A [35:N/A] [37:8.4] Instrument: [24:N/A] [35:N/A] [37:Curette] Bleeding: [24:N/A] [35:N/A] [37:Minimum] Hemostasis Achieved: [24:N/A] [35:N/A] [37:Pressure] Procedural Pain: [24:N/A] [35:N/A] [37:0] Post Procedural Pain: [24:N/A] [35:N/A] [37:0] Debridement Treatment N/A [35:N/A] [37:Procedure was tolerated] Response: [37:well] Post Debridement [24:N/A] [35:N/A] [37:4.2x2x0.1] Measurements L x W x D (cm) Post Debridement [24:N/A] [35:N/A] [37:0.66] Volume: (cm) Procedures Performed: [24:N/A 38] [35:N/A] [37:Compression Therapy Debridement 40 N/A] Photos: [24:No Photos] [35:No Photos] [37:N/A] Wound Location: [24:Right Toe - Web between Left Lower Leg - Lateral 1st and 2nd] [37:N/A] Wounding Event: [24:Gradually Appeared] [35:Gradually Appeared] [37:N/A] Primary Etiology: [24:Inflammatory] [35:Venous Leg Ulcer] [37:N/A] Secondary Etiology: [24:N/A] [35:Lymphedema] [37:N/A] Comorbid History: [24:Sleep Apnea, Hypertension, Sleep Apnea, Hypertension, N/A Paraplegia] [35:Paraplegia] Date Acquired: [24:11/30/2019] [35:02/11/2020] [37:N/A] Weeks of Treatment: [24:10] [35:0] [37:N/A] Wound Status: [24:Open] [35:Open] [37:N/A] Clustered Wound: [24:No] [35:No] [37:N/A] Clustered Quantity: [24:N/A] [35:N/A] [37:N/A] Measurements L x W x D 2.3x0.3x0.2 [35:1x0.5x0.1] [37:N/A] (cm) Area (cm) : [24:0.542] [35:0.393] [37:N/A] Volume (cm) : [24:0.108] [35:0.039] [37:N/A] % Reduction in Area: [24:-64.20%] [35:N/A] [37:N/A] % Reduction in Volume: 53.20% [35:N/A] [37:N/A] Classification: [24:Full Thickness Without Exposed Support Structures Exposed Support Structures] [35:Full Thickness Without] [37:N/A] Exudate Amount: [24:Medium] [35:Medium] [37:N/A] Exudate Type:  [24:Serosanguineous] [35:Serosanguineous] [37:N/A] Exudate Color: [24:red, brown] [35:red, brown] [37:N/A] Wound Margin: [24:Flat and Intact] [35:Distinct, outline attached N/A] Granulation Amount: [24:Large (67-100%)] [35:Small (  1-33%)] [37:N/A] Granulation Quality: [24:Red] [35:Pink] [37:N/A] Necrotic Amount: [24:Small (1-33%)] [35:Large (67-100%)] [37:N/A] Exposed Structures: [24:Fat Layer (Subcutaneous Fascia: No Tissue) Exposed: Yes Fascia: No Tendon: No Muscle: No Joint: No Bone: No] [35:Fat Layer (Subcutaneous Tissue) Exposed: No Tendon: No Muscle: No Joint: No Bone: No] [37:N/A] Epithelialization: [24:Medium (34-66%)] [35:None] [37:N/A] Debridement: [24:N/A] [35:N/A] [37:N/A] Tissue Debrided: [24:N/A] [35:N/A] [37:N/A] Level: [24:N/A] [35:N/A] [37:N/A] Debridement Area (sq cm):N/A [35:N/A] [37:N/A] Instrument: [24:N/A] [35:N/A] [37:N/A] Bleeding: [24:N/A] [35:N/A] [37:N/A] Hemostasis Achieved: [24:N/A] [35:N/A] [37:N/A] Procedural Pain: [24:N/A] [35:N/A] [37:N/A] Post Procedural Pain: [24:N/A] [35:N/A] [37:N/A] Debridement Treatment N/A [35:N/A] [37:N/A] Response: Post Debridement [24:N/A] [35:N/A] [37:N/A] Measurements L x W x D (cm) Post Debridement [24:N/A] [35:N/A] [37:N/A] Volume: (cm) Procedures Performed: N/A [35:Compression Therapy] [37:N/A] Electronic Signature(s) Signed: 02/11/2020 5:18:21 PM By: Levan Hurst RN, BSN Signed: 02/11/2020 5:34:39 PM By: Linton Ham MD Entered By: Linton Ham on 02/11/2020 08:18:29 -------------------------------------------------------------------------------- Multi-Disciplinary Care Plan Details Patient Name: Date of Service: [name]. 02/11/2020 7:30 AM Medical Record [id number] Patient Account Number: [id number] Date of Birth/Sex: Treating RN: Oct 23, 1986 (34 y.o. [name] Primary Care Jabria Loos: [name], [name] Other Clinician: Referring Ranisha Allaire: Treating Calissa Swenor/Extender:Robson, [name],  [name]: 214 Active Inactive Wound/Skin Impairment Nursing Diagnoses: Impaired tissue integrity Knowledge deficit related to ulceration/compromised skin integrity Goals: Patient/caregiver will verbalize understanding of skin care regimen Date Initiated: 01/05/2016 Target Resolution Date: 03/14/2020 Goal Status: Active Ulcer/skin breakdown will have a volume reduction of 30% by week 4 Date Initiated: 01/05/2016 Date Inactivated: 12/22/2017 Target Resolution Date: 01/19/2018 Unmet Reason: complex Goal Status: Unmet wounds, infection Interventions: Assess patient/caregiver ability to obtain necessary supplies Assess ulceration(s) every visit Provide education on ulcer and skin care Notes: Electronic Signature(s) Signed: 02/11/2020 5:18:21 PM By: Levan Hurst RN, BSN Entered By: Levan Hurst on 02/11/2020 07:49:21 -------------------------------------------------------------------------------- Pain Assessment Details Patient Name: Date of Service: Virella, [name] 02/11/2020 7:30 AM Medical Record [id number] Patient Account Number: [id number] Date of Birth/Sex: Treating RN: 1986/04/13 (34 y.o. [name] Primary Care Kullen Tomasetti: [place], [place] Other Clinician: Referring Mikenzie Mccannon: Treating Ramonita Koenig/Extender:Robson, [name], [name]: 214 Active Problems Location of Pain Severity and Description of Pain Patient Has Paino No Site Locations Rate the pain. Current Pain Level: 0 Pain Management and Medication Current Pain Management: Medication: No Cold Application: No Rest: No Massage: No Activity: No T.E.N.S.: No Heat Application: No Leg drop or elevation: No Is the Current Pain Management Adequate: Adequate How does your wound impact your activities of daily livingo Sleep: No Bathing: No Appetite: No Relationship With Others: No Bladder Continence: No Emotions: No Bowel Continence: No Work: No Toileting: No Drive:  No Dressing: No Hobbies: No Electronic Signature(s) Signed: 02/11/2020 5:27:21 PM By: Deon Pilling Entered By: Deon Pilling on 02/11/2020 07:46:38 -------------------------------------------------------------------------------- Patient/Caregiver Education Details Patient Name: Date of Service: CRIBBGrace Bushy 3/22/2021andnbsp7:30 AM Medical Record [id number] Patient Account Number: [id number] Date of Birth/Gender: 1986-09-03 (33 y.o. M) Treating RN: Levan Hurst Primary Care Physician: Janine Limbo Other Clinician: Referring Physician: Treating Physician/Extender:Robson, Pecola Leisure in Treatment: 55 Education Assessment Education Provided To: Patient Education Topics Provided Wound/Skin Impairment: Methods: Explain/Verbal Responses: State content correctly Electronic Signature(s) Signed: 02/11/2020 5:18:21 PM By: Levan Hurst RN, BSN Entered By: Levan Hurst on 02/11/2020 07:49:56 -------------------------------------------------------------------------------- Wound Assessment Details Patient Name: Date of Service: Mcquaig, [name] 02/11/2020 7:30 AM Medical Record [id number] Patient Account Number: [id number] Date of Birth/Sex: Treating RN: December 31, 1985 (34 y.o. [name] Primary Care Jenniffer Vessels: [name], [name] Other Clinician: Referring  Kwamaine Cuppett: Treating Alecea Trego/Extender:Robson, [name], [name]: 214 Wound Status Wound Number: 24 Primary Etiology: Inflammatory Wound Location: Left Foot - Dorsal Wound Status: Open Wounding Event: Gradually Appeared Comorbid Sleep Apnea, Hypertension, History: Paraplegia Date Acquired: 03/08/2018 Weeks Of Treatment: 100 Clustered Wound: Yes Photos Photo Uploaded By: Mikeal Hawthorne on 02/12/2020 13:45:16 Wound Measurements Length: (cm) 2.5 % Reduct Width: (cm) 2.7 % Reduct Depth: (cm) 0.8 Epitheli Clustered Quantity: 1 Tunnelin Area: (cm) 5.301 Undermi Volume: (cm)  4.241 Wound Description Classification: Full Thickness Without Exposed Support Foul Odo Structures Slough/F Wound Distinct, outline attached Margin: Exudate Medium Amount: Exudate Serosanguineous Type: Exudate red, brown Color: Wound Bed Granulation Amount: Medium (34-66%) Granulation Quality: Red Fascia E Necrotic Amount: Medium (34-66%) Fat Laye Necrotic Quality: Adherent Slough Tendon E Muscle E Joint Ex Bone Exp r After Cleansing: No ibrino Yes Exposed Structure xposed: No r (Subcutaneous Tissue) Exposed: Yes xposed: No xposed: No posed: No osed: No ion in Area: -2146.2% ion in Volume: -17570.8% alization: Small (1-33%) g: No ning: No Electronic Signature(s) Signed: 02/11/2020 5:27:21 PM By: Deon Pilling Entered By: Deon Pilling on 02/11/2020 08:01:44 -------------------------------------------------------------------------------- Wound Assessment Details Patient Name: Date of Service: CRIBBVerlon, Kil[place] 02/11/2020 7:30 AM Medical Record [id number] Patient Account Number: [id number] Date of Birth/Sex: Treating RN: May 13, 1986 (34 y.o. [name] Primary Care Mayari Matus: [place], [place] Other Clinician: Referring Payal Stanforth: Treating Jarrid Lienhard/Extender:Robson, [name], [name]: 214 Wound Status Wound Number: 35 Primary Etiology: Pressure Ulcer Wound Location: Left Ischium Wound Status: Open Wounding Event: Gradually Appeared Comorbid Sleep Apnea, Hypertension, History: Paraplegia Date Acquired: 04/28/2019 Weeks Of Treatment: 41 Clustered Wound: Yes Photos Photo Uploaded By: Mikeal Hawthorne on 02/12/2020 13:48:21 Wound Measurements Length: (cm) 2.7 % Reductio Width: (cm) 0.4 % Reductio Depth: (cm) 0.1 Epithelial Clustered Quantity: 2 Tunneling: Area: (cm) 0.848 Undermini Volume: (cm) 0.085 Wound Description Classification: Category/Stage III Wound Margin: Flat and Intact Exudate Amount: Medium Exudate Type:  Serosanguineous Exudate Color: red, brown Wound Bed Granulation Amount: Large (67-100%) Granulation Quality: Red, Friable Necrotic Amount: Small (1-33%) Necrotic Quality: Adherent Slough Foul Odor After Cleansing: No Slough/Fibrino Yes Exposed Structure Fascia Exposed: No Fat Layer (Subcutaneous Tissue) Exposed: Yes Tendon Exposed: No Muscle Exposed: No Joint Exposed: No Bone Exposed: No n in Area: 94% n in Volume: 94% ization: Large (67-100%) No ng: No Electronic Signature(s) Signed: 02/11/2020 5:27:21 PM By: Deon Pilling Entered By: Deon Pilling on 02/11/2020 08:02:14 -------------------------------------------------------------------------------- Wound Assessment Details Patient Name: Date of Service: Kaps, [name] 02/11/2020 7:30 AM Medical Record [id number] Patient Account Number: [id number] Date of Birth/Sex: Treating RN: 11-25-1985 (34 y.o. [name] Primary Care Gloriana Piltz: [place], [place] Other Clinician: Referring Havier Deeb: Treating Holliday Sheaffer/Extender:Robson, [name], [name]: 214 Wound Status Wound Number: 37 Primary Etiology: Venous Leg Ulcer Wound Location: Left Malleolus - Lateral Wound Status: Open Wounding Event: Gradually Appeared Comorbid Sleep Apnea, Hypertension, History: Paraplegia Date Acquired: 11/26/2019 Weeks Of Treatment: 11 Clustered Wound: No Photos Photo Uploaded By: Mikeal Hawthorne on 02/12/2020 13:47:14 Wound Measurements Length: (cm) 4.2 % Reduct Width: (cm) 2 % Reduct Depth: (cm) 0.1 Epitheli Area: (cm) 6.597 Tunneli Volume: (cm) 0.66 Undermi Wound Description Full Thickness Without Exposed Support Foul Odo Classification: Structures Slough/F Wound Flat and Intact Margin: Exudate Large Amount: Exudate Purulent Type: Exudate yellow, brown, green Color: Wound Bed Granulation Amount: Small (1-33%) Granulation Quality: Pink Fascia E Necrotic Amount: Large (67-100%) Fat Laye Necrotic  Quality: Adherent Slough Tendon E Muscle E Joint Ex Bone Exp r After Cleansing:  No ibrino Yes Exposed Structure xposed: No r (Subcutaneous Tissue) Exposed: Yes xposed: No xposed: No posed: No osed: No ion in Area: -66.7% ion in Volume: -66.7% alization: Small (1-33%) ng: No ning: No Electronic Signature(s) Signed: 02/11/2020 5:27:21 PM By: Deon Pilling Entered By: Deon Pilling on 02/11/2020 08:03:01 -------------------------------------------------------------------------------- Wound Assessment Details Patient Name: Date of Service: JORAWAR, STUTE 02/11/2020 7:30 AM Medical Record [id number] Patient Account Number: [id number] Date of Birth/Sex: Treating RN: [birth year] (34 y.o. [name] Primary Care Kadience Macchi: [place], [place] Other Clinician: Referring Lucita Montoya: Treating Jerric Oyen/Extender:Robson, [name], [name]: 214 Wound Status Wound Number: 38 Primary Etiology: Inflammatory Wound Location: Right Toe - Web between 1st and Wound Status: Open 2nd Comorbid Sleep Apnea, Hypertension, Wounding Event: Gradually Appeared History: Paraplegia Date Acquired: 11/30/2019 Weeks Of Treatment: 10 Clustered Wound: No Photos Wound Measurements Length: (cm) 2.3 % Reduct Width: (cm) 0.3 % Reduct Depth: (cm) 0.2 Epitheli Area: (cm) 0.542 Tunneli Volume: (cm) 0.108 Undermi Wound Description Classification: Full Thickness Without Exposed Support Foul Odo Structures Slough/F Wound Flat and Intact Margin: Exudate Medium Amount: Exudate Serosanguineous Type: Exudate red, brown Color: Wound Bed Granulation Amount: Large (67-100%) Granulation Quality: Red Fascia E Necrotic Amount: Small (1-33%) Fat Laye Necrotic Quality: Adherent Slough Tendon E Muscle E Joint Ex Bone Exp r After Cleansing: No ibrino Yes Exposed Structure xposed: No r (Subcutaneous Tissue) Exposed: Yes xposed: No xposed: No posed: No osed: No ion in Area:  -64.2% ion in Volume: 53.2% alization: Medium (34-66%) ng: No ning: No Electronic Signature(s) Signed: 02/12/2020 3:59:58 PM By: Mikeal Hawthorne EMT/HBOT Signed: 03/05/2020 9:02:47 AM By: Levan Hurst RN, BSN Previous Signature: 02/11/2020 5:27:21 PM Version By: Deon Pilling Entered By: Mikeal Hawthorne on 02/12/2020 11:21:20 -------------------------------------------------------------------------------- Wound Assessment Details Patient Name: Date of Service: Torre, [name] 02/11/2020 7:30 AM Medical Record [id number] Patient Account Number: [id number] Date of Birth/Sex: Treating RN: 1986-04-09 (34 y.o. [name] Primary Care Anayiah Howden: [name], [name] Other Clinician: Referring Turrell Severt: Treating Kynley Metzger/Extender:Robson, [name], [name]: 214 Wound Status Wound Number: 40 Primary Etiology: Venous Leg Ulcer Wound Location: Left Lower Leg - Lateral Secondary Lymphedema Etiology: Wounding Event: Gradually Appeared Wound Status: Open Date Acquired: 02/11/2020 Comorbid History: Sleep Apnea, Hypertension, Weeks Of Treatment: 0 Paraplegia Clustered Wound: No Photos Photo Uploaded By: Mikeal Hawthorne on 02/12/2020 13:47:15 Wound Measurements Length: (cm) 1 % Reduct Width: (cm) 0.5 % Reduct Depth: (cm) 0.1 Epitheli Area: (cm) 0.393 Tunneli Volume: (cm) 0.039 Undermi Wound Description Classification: Full Thickness Without Exposed Support Foul Odo Structures Slough/F Wound Distinct, outline attached Margin: Exudate Medium Amount: Exudate Serosanguineous Type: Exudate red, brown Color: Wound Bed Granulation Amount: Small (1-33%) Granulation Quality: Pink Fascia Exp Necrotic Amount: Large (67-100%) Fat Layer Necrotic Quality: Adherent Slough Tendon Exp Muscle Exp Joint Expo Bone Expos r After Cleansing: No ibrino Yes Exposed Structure osed: No (Subcutaneous Tissue) Exposed: No osed: No osed: No sed: No ed: No ion in  Area: ion in Volume: alization: None ng: No ning: No Electronic Signature(s) Signed: 02/11/2020 5:27:21 PM By: Deon Pilling Entered By: Deon Pilling on 02/11/2020 08:00:58 -------------------------------------------------------------------------------- Vitals Details Patient Name: Date of Service: Minium, [name] 02/11/2020 7:30 AM Medical Record [id number] Patient Account Number: [id number] Date of Birth/Sex: Treating RN: 10-03-1986 (34 y.o. [name] Primary Care Maisy Newport: [place], [place] Other Clinician: Referring Candy Leverett: Treating Momodou Consiglio/Extender:Robson, [name], [name]: 214 Vital Signs Time Taken: 07:43 Temperature (F): 98 Height (in): 70 Pulse (bpm): 105 Weight (lbs): 216 Respiratory  Rate (breaths/min): 18 Body Mass Index (BMI): 31 Blood Pressure (mmHg): 133/71 Reference Range: 80 - 120 mg / dl Electronic Signature(s) Signed: 02/11/2020 5:27:21 PM By: Deon Pilling Entered By: Deon Pilling on 02/11/2020 07:46:29

## 2020-02-15 LAB — AEROBIC CULTURE W GRAM STAIN (SUPERFICIAL SPECIMEN): Gram Stain: NONE SEEN

## 2020-02-18 ENCOUNTER — Other Ambulatory Visit: Payer: Self-pay

## 2020-02-18 ENCOUNTER — Encounter (HOSPITAL_BASED_OUTPATIENT_CLINIC_OR_DEPARTMENT_OTHER): Payer: BC Managed Care – PPO | Admitting: Internal Medicine

## 2020-02-18 DIAGNOSIS — Z89422 Acquired absence of other left toe(s): Secondary | ICD-10-CM | POA: Diagnosis not present

## 2020-02-18 DIAGNOSIS — I872 Venous insufficiency (chronic) (peripheral): Secondary | ICD-10-CM | POA: Diagnosis not present

## 2020-02-18 DIAGNOSIS — Z880 Allergy status to penicillin: Secondary | ICD-10-CM | POA: Diagnosis not present

## 2020-02-18 DIAGNOSIS — Z882 Allergy status to sulfonamides status: Secondary | ICD-10-CM | POA: Diagnosis not present

## 2020-02-18 DIAGNOSIS — L97821 Non-pressure chronic ulcer of other part of left lower leg limited to breakdown of skin: Secondary | ICD-10-CM | POA: Diagnosis not present

## 2020-02-18 DIAGNOSIS — G8221 Paraplegia, complete: Secondary | ICD-10-CM | POA: Diagnosis not present

## 2020-02-18 DIAGNOSIS — L97511 Non-pressure chronic ulcer of other part of right foot limited to breakdown of skin: Secondary | ICD-10-CM | POA: Diagnosis not present

## 2020-02-18 DIAGNOSIS — L03116 Cellulitis of left lower limb: Secondary | ICD-10-CM | POA: Diagnosis not present

## 2020-02-18 DIAGNOSIS — L97521 Non-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: Secondary | ICD-10-CM | POA: Diagnosis not present

## 2020-02-18 DIAGNOSIS — Z881 Allergy status to other antibiotic agents status: Secondary | ICD-10-CM | POA: Diagnosis not present

## 2020-02-18 DIAGNOSIS — L97321 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 to breakdown of skin: Secondary | ICD-10-CM | POA: Diagnosis not present

## 2020-02-18 DIAGNOSIS — L89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, stage 3: Secondary | ICD-10-CM | POA: Diagnosis not present

## 2020-02-18 DIAGNOSIS — Z87891 Personal history of nicotine dependence: Secondary | ICD-10-CM | POA: Diagnosis not present

## 2020-02-18 NOTE — Progress Notes (Signed)
Galiano, [name] ([id number]) Visit Report for 02/18/2020 Arrival Information Details Patient Name: Date of Service: [name] 02/18/2020 7:30 AM Medical Record [id number] Patient Account Number: [id number] Date of Birth/Sex: Treating RN: October 14, 1986 (34 y.o. [name] Primary Care Annsleigh Dragoo: [place], [place] Other Clinician: Referring Chinelo Benn: Treating Yarithza Mink/Extender:Robson, [name], [name]: 215 Visit Information History Since Last Visit All ordered tests and consults were completed: Yes Patient Arrived: Wheel Chair Added or deleted any medications: No Arrival Time: 07:57 Any new allergies or adverse reactions: No Accompanied By: self Had a fall or experienced change in No activities of daily living that may affect Transfer Assistance: None risk of falls: Patient Identification Verified: Yes Signs or symptoms of abuse/neglect since last No Secondary Verification Process Completed: Yes visito Patient Requires Transmission-Based No Hospitalized since last visit: No Precautions: Implantable device outside of the clinic excluding No Patient Has Alerts: Yes cellular tissue based products placed in the center Patient Alerts: R ABI = since last visit: 1.0 Has Dressing in Place as Prescribed: Yes L ABI = Has Compression in Place as Prescribed: Yes 1.1 Pain Present Now: No Electronic Signature(s) Signed: 02/18/2020 5:31:53 PM By: Baruch Gouty RN, BSN Entered By: Baruch Gouty on 02/18/2020 07:58:03 -------------------------------------------------------------------------------- Compression Therapy Details Patient Name: Date of Service: [name] 02/18/2020 7:30 AM Medical Record [id number] Patient Account Number: [id number] Date of Birth/Sex: Treating RN: 11-11-1986 (34 y.o. [name] Primary Care Catalaya Garr: [name], [name] Other Clinician: Referring Rowe Warman: Treating Atreyu Mak/Extender:Robson, [name],  [name]: 215 Compression Therapy Performed for Wound Wound #37 Left,Lateral Malleolus Assessment: Performed By: Clinician Deon Pilling, RN Compression Type: Four Layer Post Procedure Diagnosis Same as Pre-procedure Electronic Signature(s) Signed: 02/18/2020 5:31:53 PM By: Baruch Gouty RN, BSN Entered By: Baruch Gouty on 02/18/2020 08:23:12 -------------------------------------------------------------------------------- Encounter Discharge Information Details Patient Name: Date of Service: [name], [name] 02/18/2020 7:30 AM Medical Record [id number] Patient Account Number: [id number] Date of Birth/Sex: Treating RN: 1986-05-19 (34 y.o. [name] Primary Care Kagan Hietpas: [name], [name] Other Clinician: Referring Dominiqua Cooner: Treating Adreonna Yontz/Extender:Robson, [name], [name]: 215 Encounter Discharge Information Items Discharge Condition: Stable Ambulatory Status: Wheelchair Discharge Destination: Home Transportation: Private Auto Accompanied By: self Schedule Follow-up Appointment: Yes Clinical Summary of Care: Electronic Signature(s) Signed: 02/18/2020 4:48:26 PM By: Deon Pilling Entered By: Deon Pilling on 02/18/2020 08:48:11 -------------------------------------------------------------------------------- Lower Extremity Assessment Details Patient Name: Date of Service: [name], [name] 02/18/2020 7:30 AM Medical Record [id number] Patient Account Number: [id number] Date of Birth/Sex: Treating RN: Mar 22, 1986 (34 y.o. [name] Primary Care Mariah Gerstenberger: [place], [place] Other Clinician: Referring Dann Ventress: Treating Dezyrae Kensinger/Extender:Robson, [name], [name]: 215 Edema Assessment Assessed: [Left: No] [Right: No] Edema: [Left: Yes] [Right: Yes] Calf Left: Right: Point of Measurement: 33 cm From Medial Instep 25.7 cm 31.4 cm Ankle Left: Right: Point of Measurement: 10 cm From Medial Instep  24 cm 23.2 cm Vascular Assessment Pulses: Dorsalis Pedis Palpable: [Left:Yes] [Right:Yes] Electronic Signature(s) Signed: 02/18/2020 5:31:53 PM By: Baruch Gouty RN, BSN Entered By: Baruch Gouty on 02/18/2020 08:00:31 -------------------------------------------------------------------------------- Multi Wound Chart Details Patient Name: Date of Service: [name] Nicely. 02/18/2020 7:30 AM Medical Record [id number] Patient Account Number: [id number] Date of Birth/Sex: Treating RN: 1986-07-03 (34 y.o. [name] Primary Care Alzina Golda: [name], [name] Other Clinician: Referring Jabbar Palmero: Treating Ourania Hamler/Extender:Robson, [name], [name]: 215 Vital Signs Height(in): 70 Pulse(bpm): 100 Weight(lbs): 216 Blood Pressure(mmHg): 128/75 Body Mass Index(BMI): 31 Temperature(F): 97.8 Respiratory 18 Rate(breaths/min): Photos: [24:No Photos] [  35:No Photos] [37:No Photos] Wound Location: [24:Left Foot - Dorsal] [35:Left Ischium] [37:Left Malleolus - Lateral] Wounding Event: [24:Gradually Appeared] [35:Gradually Appeared] [37:Gradually Appeared] Primary Etiology: [24:Inflammatory] [35:Pressure Ulcer] [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] Secondary Etiology: [24:N/A] [35:N/A] [37:N/A] Comorbid History: [24:Sleep Apnea, Hypertension, Sleep Apnea, Hypertension, Sleep Apnea, Hypertension, Paraplegia] [35:Paraplegia] [37:Paraplegia] Date Acquired: [24:03/08/2018] [35:04/28/2019] [37:11/26/2019] Weeks of Treatment: [24:101] [35:42] [37:12] Wound Status: [24:Open] [35:Open] [37:Open] Clustered Wound: [24:Yes] [35:Yes] [37:No] Clustered Quantity: [24:1] [35:2] [37:N/A] Measurements L x W x D 2.3x2.4x0.3 [35:0.4x0.3x0.1] [37:4x1.6x0.1] (cm) Area (cm) : [24:4.335] [35:0.094] [37:5.027] Volume (cm) : [24:1.301] [35:0.009] [37:0.503] % Reduction in Area: [24:-1736.90%] [35:99.30%] [37:-27.00%] % Reduction in Volume: [24:-5320.80%] [35:99.40%] [37:-27.00%] Classification:  [24:Full Thickness Without Exposed Support Structures] [35:Category/Stage III] [37:Full Thickness Without Exposed Support Structures] Exudate Amount: [24:Medium] [35:Small] [37:Medium] Exudate Type: [24:Serosanguineous] [35:Serosanguineous] [37:Serosanguineous] Exudate Color: [24:red, brown] [35:red, brown] [37:red, brown] Wound Margin: [24:Distinct, outline attached Flat and Intact] [37:Flat and Intact] Granulation Amount: [24:Large (67-100%)] [35:Large (67-100%)] [37:Large (67-100%)] Granulation Quality: [24:Red] [35:Red] [37:Red, Pink, Friable] Necrotic Amount: [24:Small (1-33%)] [35:None Present (0%)] [37:Small (1-33%)] Exposed Structures: [24:Fat Layer (Subcutaneous Fat Layer (Subcutaneous Tissue) Exposed: Yes Fascia: No Tendon: No Muscle: No Joint: No Bone: No] [35:Tissue) Exposed: Yes Fascia: No Tendon: No Muscle: No Joint: No Bone: No] [37:Fat Layer (Subcutaneous Tissue) Exposed: Yes  Fascia: No Tendon: No Muscle: No Joint: No Bone: No] Epithelialization: [24:Small (1-33%)] [35:Large (67-100%)] [37:Small (1-33%)] Procedures Performed: [24:N/A 38] [35:N/A 40] [37:Compression Therapy N/A] Photos: [24:No Photos] [35:No Photos] [37:N/A] Wound Location: [24:Right Toe - Web between Left Lower Leg - Lateral 1st and 2nd] [37:N/A] Wounding Event: [24:Gradually Appeared] [35:Gradually Appeared] [37:N/A] Primary Etiology: [24:Inflammatory] [35:Venous Leg Ulcer] [37:N/A] Secondary Etiology: [24:N/A] [35:Lymphedema] [37:N/A] Comorbid History: [24:Sleep Apnea, Hypertension, Sleep Apnea, Hypertension, N/A Paraplegia] [35:Paraplegia] Date Acquired: [24:11/30/2019] [35:02/11/2020] [37:N/A] Weeks of Treatment: [24:11] [35:1] [37:N/A] Wound Status: [24:Open] [35:Open] [37:N/A] Clustered Wound: [24:No] [35:No] [37:N/A] Clustered Quantity: [24:N/A] [35:N/A] [37:N/A] Measurements L x W x D 1x0.6x0.1 [35:0.7x0.7x0.1] [37:N/A] (cm) Area (cm) : [24:0.471] [35:0.385] [37:N/A] Volume (cm) : [24:0.047]  [35:0.038] [37:N/A] % Reduction in Area: [24:-42.70%] [35:2.00%] [37:N/A] % Reduction in Volume: 79.70% [35:2.60%] [37:N/A] Classification: [24:Full Thickness Without Exposed Support Structures Exposed Support Structures] [35:Full Thickness Without] [37:N/A] Exudate Amount: [24:Small] [35:Small] [37:N/A] Exudate Type: [24:Serosanguineous] [35:Serosanguineous] [37:N/A] Exudate Color: [24:red, brown] [35:red, brown] [37:N/A] Wound Margin: [24:Flat and Intact] [35:Flat and Intact] [37:N/A] Granulation Amount: [24:Medium (34-66%)] [35:Medium (34-66%)] [37:N/A] Granulation Quality: [24:Red] [35:Pink] [37:N/A] Necrotic Amount: [24:Medium (34-66%)] [35:Medium (34-66%)] [37:N/A] Exposed Structures: [24:Fat Layer (Subcutaneous Fat Layer (Subcutaneous N/A Tissue) Exposed: Yes Fascia: No Tendon: No Muscle: No Joint: No Bone: No] [35:Tissue) Exposed: Yes Fascia: No Tendon: No Muscle: No Joint: No Bone: No] Epithelialization: [24:Large (67-100%)] [35:Small (1-33%)] [37:N/A] Procedures Performed: N/A [35:N/A] [37:N/A] Treatment Notes Wound #24 (Left, Dorsal Foot) [24:1. Cleanse With Wound Cleanser Soap and water 2. Periwound Care Antifungal cream Barrier cream Moisturizing lotion TCA Cream 3. Primary Dressing Applied Iodoflex 4. Secondary Dressing Dry Gauze 6. Support Layer Applied 4  layer compression wrap] Wound #35 (Left Ischium) 1. Cleanse With Wound Cleanser 3. Primary Dressing Applied Polymem Ag 4. Secondary Dressing Foam Border Dressing 5. Secured With Self Adhesive Bandage Wound #37 (Left, Lateral Malleolus) 1. Cleanse With Wound Cleanser Soap and water 2. Periwound Care Barrier cream Moisturizing lotion TCA Cream 3. Primary Dressing Applied Calcium Alginate Ag 4. Secondary Dressing ABD Pad 6. Support Layer Applied 4 layer compression wrap Wound #38 (Right Toe - Web between 1st and 2nd) 1. Cleanse With Wound Cleanser Soap and  water 2. Periwound Care Antifungal  cream Moisturizing lotion TCA Cream 3. Primary Dressing Applied Calcium Alginate Ag 4. Secondary Dressing Dry Gauze Roll Gauze 5. Secured With Med[employer] tape Notes juxtalite Wound #40 (Left, Lateral Lower Leg) 1. Cleanse With Wound Cleanser Soap and water 2. Periwound Care Barrier cream Moisturizing lotion TCA Cream 3. Primary Dressing Applied Calcium Alginate Ag 4. Secondary Dressing ABD Pad 6. Support Layer Applied 4 layer compression Water quality scientist) Signed: 02/18/2020 5:31:36 PM By: Linton Ham MD Signed: 02/18/2020 5:31:53 PM By: Baruch Gouty RN, BSN Entered By: Linton Ham on 02/18/2020 09:38:31 -------------------------------------------------------------------------------- Multi-Disciplinary Care Plan Details Patient Name: Date of Service: [name], [name] 02/18/2020 7:30 AM Medical Record [id number] Patient Account Number: [id number] Date of Birth/Sex: Treating RN: [birth year] (34 y.o. [name] Primary Care Aleyna Cueva: [name], [name] Other Clinician: Referring Kiaira Pointer: Treating Malakai Schoenherr/Extender:Robson, [name], [name]: 215 Active Inactive Wound/Skin Impairment Nursing Diagnoses: Impaired tissue integrity Knowledge deficit related to ulceration/compromised skin integrity Goals: Patient/caregiver will verbalize understanding of skin care regimen Date Initiated: 01/05/2016 Target Resolution Date: 03/14/2020 Goal Status: Active Ulcer/skin breakdown will have a volume reduction of 30% by week 4 Date Initiated: 01/05/2016 Date Inactivated: 12/22/2017 Target Resolution Date: 01/19/2018 Unmet Reason: complex Goal Status: Unmet wounds, infection Interventions: Assess patient/caregiver ability to obtain necessary supplies Assess ulceration(s) every visit Provide education on ulcer and skin care Notes: Electronic Signature(s) Signed: 02/18/2020 5:31:53 PM By: Baruch Gouty RN, BSN Entered By: Baruch Gouty on 02/18/2020 08:08:13 -------------------------------------------------------------------------------- Pain Assessment Details Patient Name: Date of Service: Oswald, Grace [name]. 02/18/2020 7:30 AM Medical Record [id number] Patient Account Number: [id number] Date of Birth/Sex: Treating RN: 13-Aug-1986 (34 y.o. [name] Primary Care Ruberta Holck: [place], [place] Other Clinician: Referring Gianni Mihalik: Treating Jamarkus Lisbon/Extender:Robson, [name], [name]: 215 Active Problems Location of Pain Severity and Description of Pain Patient Has Paino No Site Locations Rate the pain. Current Pain Level: 0 Pain Management and Medication Current Pain Management: Electronic Signature(s) Signed: 02/18/2020 5:31:53 PM By: Baruch Gouty RN, BSN Entered By: Baruch Gouty on 02/18/2020 07:58:53 -------------------------------------------------------------------------------- Patient/Caregiver Education Details Patient Name: Date of Service: CRIBBGrace [name] 3/29/2021andnbsp7:30 AM Medical Record [id number] Patient Account Number: [id number] Date of Birth/Gender: 1986/02/14 (33 y.o. M) Treating RN: Baruch Gouty Primary Care Physician: Janine Limbo Other Clinician: Referring Physician: Treating Physician/Extender:Robson, Pecola Leisure in Treatment: 215 Education Assessment Education Provided To: Patient Education Topics Provided Infection: Methods: Explain/Verbal Responses: Reinforcements needed, State content correctly Venous: Methods: Explain/Verbal Responses: Reinforcements needed, State content correctly Wound/Skin Impairment: Methods: Explain/Verbal Responses: Reinforcements needed, State content correctly Electronic Signature(s) Signed: 02/18/2020 5:31:53 PM By: Baruch Gouty RN, BSN Entered By: Baruch Gouty on 02/18/2020 08:08:56 -------------------------------------------------------------------------------- Wound  Assessment Details Patient Name: Date of Service: [name], [name] 02/18/2020 7:30 AM Medical Record [id number] Patient Account Number: [id number] Date of Birth/Sex: Treating RN: [birth year] (34 y.o. [name] Primary Care Tanganyika Bowlds: [place], [place] Other Clinician: Referring Shelma Eiben: Treating Trinitey Roache/Extender:Robson, [name], [name]: 215 Wound Status Wound Number: 24 Primary Etiology: Inflammatory Wound Location: Left Foot - Dorsal Wound Status: Open Wounding Event: Gradually Appeared Comorbid Sleep Apnea, Hypertension, History: Paraplegia Date Acquired: 03/08/2018 Weeks Of Treatment: 101 Clustered Wound: Yes Wound Measurements Length: (cm) 2.3 % Reduct Width: (cm) 2.4 % Reduct Depth: (cm) 0.3 Epitheli Clustered Quantity: 1 Tunnelin Area: (cm) 4.335 Undermi Volume: (cm) 1.301 Wound Description Classification: Full Thickness Without Exposed Support Foul Odo Structures Slough/F Wound Distinct, outline attached Margin: Exudate Medium Amount: Exudate Serosanguineous Type:  Exudate red, brown Color: Wound Bed Granulation Amount: Large (67-100%) Granulation Quality: Red Fascia E Necrotic Amount: Small (1-33%) Fat Laye Necrotic Quality: Adherent Slough Tendon E Muscle E Joint Ex Bone Exp r After Cleansing: No ibrino Yes Exposed Structure xposed: No r (Subcutaneous Tissue) Exposed: Yes xposed: No xposed: No posed: No osed: No ion in Area: -1736.9% ion in Volume: -5320.8% alization: Small (1-33%) g: No ning: No Treatment Notes Wound #24 (Left, Dorsal Foot) 1. Cleanse With Wound Cleanser Soap and water 2. Periwound Care Antifungal cream Barrier cream Moisturizing lotion TCA Cream 3. Primary Dressing Applied Iodoflex 4. Secondary Dressing Dry Gauze 6. Support Layer Applied 4 layer compression Water quality scientist) Signed: 02/18/2020 5:31:53 PM By: Baruch Gouty RN, BSN Entered By: Baruch Gouty on  02/18/2020 08:05:42 -------------------------------------------------------------------------------- Wound Assessment Details Patient Name: Date of Service: [name], [name] 02/18/2020 7:30 AM Medical Record [id number] Patient Account Number: [id number] Date of Birth/Sex: Treating RN: 1986-06-18 (34 y.o. [name] Primary Care Haasini Patnaude: [place], [place] Other Clinician: Referring Berline Semrad: Treating Jalaiyah Throgmorton/Extender:Robson, [name], [name]: 215 Wound Status Wound Number: 35 Primary Etiology: Pressure Ulcer Wound Location: Left Ischium Wound Status: Open Wounding Event: Gradually Appeared Comorbid Sleep Apnea, Hypertension, History: Paraplegia Date Acquired: 04/28/2019 Weeks Of Treatment: 42 Clustered Wound: Yes Wound Measurements Length: (cm) 0.4 Width: (cm) 0.3 Depth: (cm) 0.1 Clustered Quantity: 2 Area: (cm) 0.094 Volume: (cm) 0.009 Wound Description Classification: Category/Stage III Wound Margin: Flat and Intact Exudate Amount: Small Exudate Type: Serosanguineous Exudate Color: red, brown Wound Bed Granulation Amount: Large (67-100%) Granulation Quality: Red Necrotic Amount: None Present (0%) After Cleansing: No rino Yes Exposed Structure osed: No (Subcutaneous Tissue) Exposed: Yes osed: No osed: No sed: No ed: No % Reduction in Area: 99.3% % Reduction in Volume: 99.4% Epithelialization: Large (67-100%) Tunneling: No Undermining: No Foul Odor Slough/Fib Fascia Exp Fat Layer Tendon Exp Muscle Exp Joint Expo Bone Expos Treatment Notes Wound #35 (Left Ischium) 1. Cleanse With Wound Cleanser 3. Primary Dressing Applied Polymem Ag 4. Secondary Dressing Foam Border Dressing 5. Secured With Office manager) Signed: 02/18/2020 5:31:53 PM By: Baruch Gouty RN, BSN Entered By: Baruch Gouty on 02/18/2020  08:06:06 -------------------------------------------------------------------------------- Wound Assessment Details Patient Name: Date of Service: [name], [name] 02/18/2020 7:30 AM Medical Record [id number] Patient Account Number: [id number] Date of Birth/Sex: Treating RN: [birth year] (34 y.o. [name] Primary Care Radha Coggins: [place], [place] Other Clinician: Referring Trevionne Advani: Treating Shawnie Nicole/Extender:Robson, [name], [name]: 215 Wound Status Wound Number: 37 Primary Etiology: Venous Leg Ulcer Wound Location: Left Malleolus - Lateral Wound Status: Open Wounding Event: Gradually Appeared Comorbid Sleep Apnea, Hypertension, History: Paraplegia Date Acquired: 11/26/2019 Weeks Of Treatment: 12 Clustered Wound: No Wound Measurements Length: (cm) 4 % Reduct Width: (cm) 1.6 % Reduct Depth: (cm) 0.1 Epitheli Area: (cm) 5.027 Tunneli Volume: (cm) 0.503 Undermi Wound Description Classification: Full Thickness Without Exposed Support Foul Odo Structures Slough/F Wound Flat and Intact Margin: Exudate Medium Amount: Exudate Serosanguineous Type: Exudate red, brown Color: Wound Bed Granulation Amount: Large (67-100%) Granulation Quality: Red, Pink, Friable Fascia E Necrotic Amount: Small (1-33%) Fat Laye Necrotic Quality: Adherent Slough Tendon E Muscle E Joint Ex Bone Exp r After Cleansing: No ibrino Yes Exposed Structure xposed: No r (Subcutaneous Tissue) Exposed: Yes xposed: No xposed: No posed: No osed: No ion in Area: -27% ion in Volume: -27% alization: Small (1-33%) ng: No ning: No Treatment Notes Wound #37 (Left, Lateral Malleolus) 1. Cleanse With Wound Cleanser Soap and  water 2. Periwound Care Barrier cream Moisturizing lotion TCA Cream 3. Primary Dressing Applied Calcium Alginate Ag 4. Secondary Dressing ABD Pad 6. Support Layer Applied 4 layer compression Water quality scientist) Signed: 02/18/2020  5:31:53 PM By: Baruch Gouty RN, BSN Entered By: Baruch Gouty on 02/18/2020 08:06:35 -------------------------------------------------------------------------------- Wound Assessment Details Patient Name: Date of Service: [name], [name] 02/18/2020 7:30 AM Medical Record [id number] Patient Account Number: [id number] Date of Birth/Sex: Treating RN: May 15, 1986 (34 y.o. [name] Primary Care Liberty Stead: [name], [name] Other Clinician: Referring Tiphani Mells: Treating Helen Cuff/Extender:Robson, [name], [name]: 215 Wound Status Wound Number: 38 Primary Etiology: Inflammatory Wound Location: Right Toe - Web between 1st and Wound Status: Open 2nd Comorbid Sleep Apnea, Hypertension, Wounding Event: Gradually Appeared History: Paraplegia Date Acquired: 11/30/2019 Weeks Of Treatment: 11 Clustered Wound: No Wound Measurements Length: (cm) 1 % Reduct Width: (cm) 0.6 % Reduct Depth: (cm) 0.1 Epitheli Area: (cm) 0.471 Tunneli Volume: (cm) 0.047 Undermi Wound Description Classification: Full Thickness Without Exposed Support Foul Odo Structures Slough/F Wound Flat and Intact Margin: Exudate Small Amount: Exudate Serosanguineous Type: Exudate red, brown Color: Wound Bed Granulation Amount: Medium (34-66%) Granulation Quality: Red Fascia E Necrotic Amount: Medium (34-66%) Fat Laye Necrotic Quality: Adherent Slough Tendon E Muscle Ex Joint Exp Bone Expo r After Cleansing: No ibrino Yes Exposed Structure xposed: No r (Subcutaneous Tissue) Exposed: Yes xposed: No posed: No osed: No sed: No ion in Area: -42.7% ion in Volume: 79.7% alization: Large (67-100%) ng: No ning: No Treatment Notes Wound #38 (Right Toe - Web between 1st and 2nd) 1. Cleanse With Wound Cleanser Soap and water 2. Periwound Care Antifungal cream Moisturizing lotion TCA Cream 3. Primary Dressing Applied Calcium Alginate Ag 4. Secondary Dressing Dry  Gauze Roll Gauze 5. Secured With [employer] tape Notes Development worker, international aid) Signed: 02/18/2020 5:31:53 PM By: Baruch Gouty RN, BSN Entered By: Baruch Gouty on 02/18/2020 08:07:04 -------------------------------------------------------------------------------- Wound Assessment Details Patient Name: Date of Service: [name], [name] 02/18/2020 7:30 AM Medical Record [id number] Patient Account Number: [id number] Date of Birth/Sex: Treating RN: [birth year] (34 y.o. [name] Primary Care Saniah Schroeter: [place], [place] Other Clinician: Referring Errik Mitchelle: Treating Tynisha Ogan/Extender:Robson, [name], [name]: 215 Wound Status Wound Number: 40 Primary Etiology: Venous Leg Ulcer Wound Location: Left Lower Leg - Lateral Secondary Lymphedema Etiology: Wounding Event: Gradually Appeared Wound Status: Open Date Acquired: 02/11/2020 Comorbid History: Sleep Apnea, Hypertension, Weeks Of Treatment: 1 Paraplegia Clustered Wound: No Wound Measurements Length: (cm) 0.7 % Reductio Width: (cm) 0.7 % Reductio Depth: (cm) 0.1 Epithelial Area: (cm) 0.385 Tunneling Volume: (cm) 0.038 Undermini Wound Description Classification: Full Thickness Without Exposed Support Foul Odo Structures Slough/F Wound Flat and Intact Margin: Exudate Small Amount: Exudate Serosanguineous Type: Exudate red, brown Color: Wound Bed Granulation Amount: Medium (34-66%) Granulation Quality: Pink Fascia E Necrotic Amount: Medium (34-66%) Fat Laye Necrotic Quality: Adherent Slough Tendon E Muscle E Joint Ex Bone Exp r After Cleansing: No ibrino Yes Exposed Structure xposed: No r (Subcutaneous Tissue) Exposed: Yes xposed: No xposed: No posed: No osed: No n in Area: 2% n in Volume: 2.6% ization: Small (1-33%) : No ng: No Treatment Notes Wound #40 (Left, Lateral Lower Leg) 1. Cleanse With Wound Cleanser Soap and water 2. Periwound Care Barrier  cream Moisturizing lotion TCA Cream 3. Primary Dressing Applied Calcium Alginate Ag 4. Secondary Dressing ABD Pad 6. Support Layer Applied 4 layer compression Water quality scientist) Signed: 02/18/2020 5:31:53 PM By: Baruch Gouty RN, BSN Entered By: Johna Roles,  Linda on 02/18/2020 08:07:41 -------------------------------------------------------------------------------- Vitals Details Patient Name: Date of Service: [name], [name] 02/18/2020 7:30 AM Medical Record [id number] Patient Account Number: [id number] Date of Birth/Sex: Treating RN: November 03, 1986 (34 y.o. [name] Primary Care Tacie Mccuistion: [place], [place] Other Clinician: Referring Bridgid Printz: Treating Nicolemarie Wooley/Extender:Robson, [name], [name]: 215 Vital Signs Time Taken: 07:58 Temperature (F): 97.8 Height (in): 70 Pulse (bpm): 100 Source: Stated Respiratory Rate (breaths/min): 18 Weight (lbs): 216 Blood Pressure (mmHg): 128/75 Source: Stated Reference Range: 80 - 120 mg / dl Body Mass Index (BMI): 31 Electronic Signature(s) Signed: 02/18/2020 5:31:53 PM By: Baruch Gouty RN, BSN Entered By: Baruch Gouty on 02/18/2020 07:58:44

## 2020-02-18 NOTE — Progress Notes (Signed)
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 infections in treated him aggressively for this although we have not made any improvement here. 08/11/17; left lateral leg. Surface continues to improve with the Iodoflex although we are not seeing much improvement in overall wound dimensions. Areas on his plantar left foot and right foot show no improvement. In fact the right foot looks somewhat worse 08/18/17; left lateral leg. We changed to [facility] Blue last week after a prolonged course of Iodoflex which helps get the surface better. It appears that the wound with is improved. Continue with difficult areas on the left dorsal first second and plantar first second on the right 09/01/17; patient arrives in clinic today having had  a temperature of 103 yesterday. He was seen in the ER and [facility]. The patient was concerned he could have cellulitis again in the right leg however they diagnosed him with a UTI and he is now on Keflex. He has a history of cellulitis which is been recurrent and difficult but this is been in the left leg, in the past 5 use doxycycline. He does in and out catheterizations at home which are risk factors for UTI 09/08/17; patient will be completing his Keflex this weekend. The erythema on the left leg is considerably better. He has  a new wound today on the medial part of the right leg small superficial almost looks like a skin tear. He has worsening of the area on the right dorsal first and second toe. His major area on the left lateral leg is better. Using Hydrofera Blue on all areas 09/15/17; gradual reduction in width on the long wound in the left lateral leg. No debridement required. He also has wounds on the plantar aspect of his left first second toe web space and on the dorsal aspect of the right first second toe web space. 09/22/17; there continues to be very gradual improvements in the dimensions of the left lateral leg wound. He hasn't round erythematous spot with might be pressure on his wheelchair. There is no evidence obviously of infection no purulence no warmth He has a dry scaled area on the plantar aspect of the left first second toe Improved area on the dorsal right first second toe. 09/29/17; left lateral leg wound continues to improve in dimensions mostly with an is still a fairly long but increasingly narrow wound. He has a dry scaled area on the plantar aspect of his left first second toe web space Increasingly concerning area on the dorsal right first second toe. In fact I am concerned today about possible cellulitis around this wound. The areas extending up his second toe and although there is deformities here almost appears to abut on the nailbed. 10/06/17; left lateral leg wound continues to make very gradual progress. Tissue culture I did from the right first second toe dorsal foot last time grew MRSA and enterococcus which was vancomycin sensitive. This was not sensitive to clindamycin or doxycycline. He is allergic to Zyvox and sulfa we have therefore arrange for him to have dalvance infusion tomorrow. He is had this in the past and tolerated it well 10/20/17; left lateral leg wound continues to make decent progress. This is certainly reduced in terms of with there is advancing  epithelialization.The cellulitis in the right foot looks better although he still has a deep wound in the dorsal aspect of the first second toe web space. Plantar left first toe web space on the left I think is making some progress 10/27/17; left lateral leg wound continues to make decent progress. Advancing epithelialization.using Hydrofera Blue The right first second toe web space wound is better-looking using silver alginate Improvement in the left plantar first second toe web space. Again using silver alginate 11/03/17 left lateral leg wound continues to make decent progress albeit slowly. Using [employer] The right per second toe web space continues to be a very problematic looking punched out wound. I obtained a piece of tissue for deep culture I did extensively treated this for fungus. It is difficult to imagine that this is a pressure area as the patient states other than going outside he doesn't really wear shoes at home The left plantar first second toe web space looked  fairly senescent. Necrotic edges. This required debridement change to [facility] Blue to all wound areas 11/10/17; left lateral leg wound continues to contract. Using Hydrofera Blue On the right dorsal first second toe web space dorsally. Culture I did of this area last week grew MRSA there is not an easy oral option in this patient was multiple antibiotic allergies or intolerances. This was only a rare culture isolate I'm therefore going to use Bactroban under silver alginate On the left plantar first second toe web space. Debridement is required here. This is also unchanged 11/17/17; left lateral leg wound continues to contract using Hydrofera Blue this is no longer the major issue. The major concern here is the right first second toe web space. He now has an open area going from dorsally to the plantar aspect. There is now wound on the inner lateral part of the first toe. Not a very viable surface on this. There is  erythema spreading medially into the forefoot. No major change in the left first second toe plantar wound 11/24/17; left lateral leg wound continues to contract using Hydrofera Blue. Nice improvement today The right first second toe web space all of this looks a lot less angry than last week. I have given him clindamycin and topical Bactroban for MRSA and terbinafine for the possibility of underlining tinea pedis that I could not control with ketoconazole. Looks somewhat better The area on the plantar left first second toe web space is weeping with dried debris around the wound 12/01/17; left lateral leg wound continues to contract he Hydrofera Blue. It is becoming thinner in terms of with nevertheless it is making good improvement. The right first second toe web space looks less angry but still a large necrotic-looking wounds starting on the plantar aspect of the right foot extending between the toes and now extensively on the base of the right second toe. I gave him clindamycin and topical Bactroban for MRSA anterior benefiting for the possibility of underlying tinea pedis. Not looking better today The area on the left first/second toe looks better. Debrided of necrotic debris 12/05/17* the patient was worked in urgently today because over the weekend he found blood on his incontinence bad when he woke up. He was found to have an ulcer by his wife who does most of his wound care. He came in today for [place] to look at this. He has not had a history of wounds in his buttocks in spite of his paraplegia. 12/08/17; seen in follow-up today at his usual appointment. He was seen earlier this week and found to have a new wound on his buttock. We also follow him for wounds on the left lateral leg, left first second toe web space and right first second toe web space 12/15/17; we have been using Hydrofera Blue to the left lateral leg which has improved. The right first second toe web space has also improved. Left  first second toe web space plantar aspect looks stable. The left buttock has worsened using Santyl. Apparently the buttock has drainage 12/22/17; we have been using Hydrofera Blue to the left lateral leg which continues to improve now 2 small wounds separated by normal skin. He tells [place] he had a fever up to 100 yesterday he is prone to UTIs but has not noted anything different. He does in and out catheterizations. The area between the first and second toes today does not look good necrotic surface covered with what looks to be purulent drainage and erythema extending into  the third toe. I had gotten this to something that I thought look better last time however it is not look good today. He also has a necrotic surface over the buttock wound which is expanded. I thought there might be infection under here so I removed a lot of the surface with a #5 curet though nothing look like it really needed culturing. He is been using Santyl to this area 12/27/17; his original wound on the left lateral leg continues to improve using Hydrofera Blue. I gave him samples of Baxdella although he was unable to take them out of fear for an allergic reaction ["lump in his throat"].the culture I did of the purulent drainage from his second toe last week showed both enterococcus and a set Enterobacter I was also concerned about the erythema on the bottom of his foot although paradoxically although this looks somewhat better today. 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 We've been using silver alginate here. They came up today that he sleeps in a recliner, I'm not sure why but I asked him to stop this 01/03/18; his original wound we've been using Hydrofera Blue is now separated into 2 areas. Ulcer on his left buttock is better he is off the recliner and sleeping in bed Finally both wound areas between his first and second toes  also looks some better 01/10/18; his original wound on the left lateral leg is now separated into 2 wounds we've been using Hydrofera Blue Ulcer on his left buttock has some drainage. There is a small probing site going into muscle layer superiorly.using silver alginate -He arrives today with a deep tissue injury on the left heel The wound on the dorsal aspect of his first second toe on the left looks a lot betterusing silver alginate ketoconazole The area on the first second toe web space on the right also looks a lot bette 01/17/18; his original wound on the left lateral leg continues to progress using Hydrofera Blue Ulcer on his left buttock also is smaller surface healthier except for a small probing site going into the muscle layer superiorly. 2.4 cm of tunneling in this area DTI on his left heel we have only been offloading. Looks better than last week no threatened open no evidence of infection the wound on the dorsal aspect of the first second toe on the left continues to look like it's regressing we have only been using silver alginate and terbinafine orally The area in the first second toe web space on the right also looks to be a lot better using silver alginate and terbinafine I think this was prompted by tinea pedis 01/31/18; the patient was hospitalized in [place] last week apparently for a complicated UTI. He was discharged on cefepime he does in and out catheterizations. In the hospital he was discovered M I don't mild elevation of AST and ALTs and the terbinafine was stopped.predictably the pressure ulcer on his buttock looks betterusing silver alginate. The area on the left lateral leg also is better using Hydrofera Blue. The area between the first and second toes on the left better. First and second toes on the right still substantial but better. Finally the DTI on the left heel has held together and looks like it's resolving 02/07/18-he is here in follow-up evaluation for  multiple ulcerations. He has new injury to the lateral aspect of the last issue a pressure ulcer, he states this  is from adhesive removal trauma. He states he has tried multiple adhesive products with no success. All other ulcers appear stable. The left heel DTI is resolving. We will continue with same treatment plan and follow-up next week. 02/14/18; follow-up for multiple areas. He has a new area last week on the lateral aspect of his pressure ulcer more over the posterior trochanter. The original pressure ulcer looks quite stable has healthy granulation. 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 Almost fully epithelialized on the original superior area using Hydrofera Blue DTI on the left heel has peeled off this week to reveal a small superficial wound under denuded skin and subcutaneous tissue Both areas between the first and second toes look better including nothing open on the left 02/21/18; The patient's wounds on his left ischial tuberosity and posterior left greater trochanter actually looked better. He has a large area of irritation around the area which I think is contact dermatitis. I am doubtful that this is fungal His original wound on the left lateral calf continues to improve we have been using Hydrofera Blue There is no open area in the left first second toe web space although there is a lot of thick callus The DTI on the left heel required debridement today of necrotic surface eschar and subcutaneous tissue using silver alginate Finally the area on the right first second toe webspace continues to contract using silver alginate and ketoconazole 02/28/18 Left ischial tuberosity wounds look better using silver alginate. Original wound on the left calf only has one small open area left using Hydrofera Blue DTI on the left heel required debridement mostly removing skin from around  this wound surface. Using silver alginate The areas on the right first/second toe web space using silver alginate and ketoconazole 03/08/18 on evaluation today patient appears to be doing decently well as best I can tell in regard to his wounds. This is the first time that I have seen him as he generally is followed by Dr. Dellia Nims. With that being said none of his wounds appear to be infected he does have an area where there is some skin covering what appears to be a new wound on the left dorsal surface of his great toe. This is right at the nail bed. With that being said I do believe that debrided away some of the excess skin can be of benefit in this regard. Otherwise he has been tolerating the dressing changes without complication. 03/14/18; patient arrives today with the multiplicity of wounds that we are following. He has not been systemically unwell Original wound on the left lateral calf now only has 2 small open areas we've been using Hydrofera Blue which should continue The deep tissue injury on the left heel requires debridement today. We've been using silver alginate The left first second toe and the right first second toe are both are reminiscence what I think was tinea pedis. Apparently some of the callus Surface between the toes was removed last week when it started draining. Purulent drainage coming from the wound on the ischial tuberosity on the left. 03/21/18-He is here in follow-up evaluation for multiple wounds. 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 He tolerated debridement. The only change to last week's recommendations is to discontinue antifungal cream between toes. He will follow-up next week 03/28/18; following up for multiple wounds;Concern this week is  streaking redness and swelling in the right foot. He is going to need antibiotics for this. 03/31/18; follow-up for right foot cellulitis.  Streaking redness and swelling in the right foot on 03/28/18. 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 I put him on clindamycin 300 mg every 6 and brought him in for a quick check. He has an open wound between his first and second toes on the right foot as a potential source. 04/04/18; Right foot cellulitis is resolving he is completing clindamycin. This is truly good news Left lateral calf wound which is initial wound only has one small open area inferiorly this is close to healing out. He has compression stockings. We will use Hydrofera Blue right down to the epithelialization of this Nonviable surface on the left heel which was initially pressure with a DTI. We've been using Hydrofera Blue. I'm going to switch this back to silver alginate Left first second toe/tinea pedis this looks better using silver alginate Right first second toe tinea pedis using silver alginate Large pressure ulcers on theLeft ischial tuberosity. Small wound here Looks better. I am uncertain about the surface over the large wound. Using silver alginate 04/11/18; Cellulitis in the right foot is resolved Left lateral calf wound which was his original wounds still has 2 tiny open areas remaining this is just about closed Nonviable surface on the left heel is better but still requires debridement Left first second toe/tinea pedis still open using silver alginate Right first second toe wound tinea pedis I asked him to go back to using ketoconazole and silver alginate Large pressure ulcers on the left ischial tuberosity this shear injury here is resolved. Wound is smaller. No evidence of infection using silver alginate 04/18/18; Patient arrives with an intense area of cellulitis in the right mid lower calf extending into the right heel area. Bright red and warm. Smaller area on the left anterior leg. He has a significant history of MRSA. He will definitely need antibioticsdoxycycline He now has 2 open  areas on the left ischial tuberosity the original large wound and now a satellite area which I think was above his initial satellite areas. Not a wonderful surface on this satellite area surrounding erythema which looks like pressure related. His left lateral calf wound again his original wound is just about closed Left heel pressure injury still requiring debridement Left first second toe looks a lot better using silver alginate Right first second toe also using silver alginate and ketoconazole cream also looks better 04/20/18; the patient was worked in early today out of concerns with his cellulitis on the right leg. I had started him on doxycycline. This was 2 days ago. His wife was concerned about the swelling in the area. Also concerned about the left buttock. He has not been systemically unwell no fever chills. No nausea vomiting or diarrhea 04/25/18; the patient's left buttock wound is continued to deteriorate he is using Hydrofera Blue. He is still completing clindamycin for the cellulitis on the right leg although all of this looks better. 05/02/18 Left buttock wound still with a lot of drainage and a very tightly adherent fibrinous necrotic surface. He has a deeper area superiorly The left lateral calf wound is still closed DTI wound on the left heel necrotic surface especially the circumference using Iodoflex Areas between his left first second toe and right first second toe both look better. Dorsally and the right first second toe he had a necrotic surface although at smaller. In using  silver alginate and ketoconazole. I did a culture last week which was a deep tissue culture of the reminiscence of the open wound on the right first second toe dorsally. This grew a few Acinetobacter and a few methicillin-resistant staph aureus. Nevertheless the area actually this week looked better. I didn't feel the need to specifically address this at least in terms of systemic antibiotics. 05/09/18;  wounds are measuring larger more drainage per our intake. We are using Santyl covered with alginate on the large superficial buttock wounds, Iodosorb on the left heel, ketoconazole and silver alginate to the dorsal first and second toes bilaterally. 05/16/18; The area on his left buttock better in some aspects although the area superiorly over the ischial tuberosity required an extensive debridement.using Santyl Left heel appears stable. Using Iodoflex The areas between his first and second toes are not bad however there is spreading erythema up the dorsal aspect of his left foot this looks like cellulitis again. He is insensate the erythema is really very brilliant.o Erysipelas He went to [name] [facility] allergist days ago because he was itching part of this he had lab work done. This showed a white count of 15.1 with 70% neutrophils. Hemoglobin of 11.4 and a platelet count of 659,000. Last white count we had in Epic was a 2-1/2 years ago which was 25.9 but he was ill at the time. He was able to show me some lab work that was done by his primary physician the pattern is about the same. 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 But prompted me to go ahead and do x-rays of both feet and the pelvis rule out osteomyelitis. He also had a comprehensive metabolic panel this was reasonably normal his albumin was 3.7 liver function tests BUN/creatinine all normal 05/23/18; x-rays of both his feet from last week were negative for underlying pulmonary abnormality. The x-ray of his pelvis however showed mild irregularity in the left ischial which may represent some early osteomyelitis. The wound in the left ischial continues to get deeper clearly now exposed muscle. Each week necrotic surface material over this area. Whereas the rest of the wounds do not look so bad. The left ischial wound we have been using Santyl and calcium alginate To the left heel surface necrotic  debris using Iodoflex The left lateral leg is still healed Areas on the left dorsal foot and the right dorsal foot are about the same. There is some inflammation on the left which might represent contact dermatitis, fungal dermatitis I am doubtful cellulitis although this looks better than last week 05/30/18; CT scan done at Hospital did not show any osteomyelitis or abscess. Suggested the possibility of underlying cellulitis although I don't [name] a lot of evidence of this at the bedside The wound itself on the left buttock/upper thigh actually looks somewhat better. No debridement Left heel also looks better no debridement continue Iodoflex Both dorsal first second toe spaces appear better using Lotrisone. Left still required debridement 06/06/18; Intake reported some purulent looking drainage from the left gluteal wound. Using Santyl and calcium alginate Left heel looks better although still a nonviable surface requiring debridement The left dorsal foot first/second webspace actually expanding and somewhat deeper. I may consider doing a shave biopsy of this area Right dorsal foot first/second webspace appears stable to improved. Using Lotrisone and silver alginate to both these areas 06/13/18 Left gluteal surface looks better. Now separated in the 2 wounds. No debridement required.  Still drainage. We'll continue silver alginate Left heel continues to look better with Iodoflex continue this for at least another week Of his dorsal foot wounds the area on the left still has some depth although it looks better than last week. We've been using Lotrisone and silver alginate 06/20/18 Left gluteal continues to look better healthy tissue Left heel continues to look better healthy granulation wound is smaller. He is using Iodoflex and his long as this continues continue the Iodoflex Dorsal right foot looks better unfortunately dorsal left foot does not. There is swelling and erythema of his  forefoot. He had minor trauma to this several days ago but doesn't think this was enough to have caused any tissue injury. Foot looks like cellulitis, we have had this problem before 06/27/18 on evaluation today patient appears to be doing a little worse in regard to his foot ulcer. 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 Both of which would really be his only options for treating this infection. In the past he has been given and effusion of Orbactiv. This is done very well for him in the past again it's one time dosing IV antibiotic therapy. Subsequently I do believe this is something we're gonna need to [name] about doing at this point in time. Currently his other wounds seem to be doing somewhat better in my pinion I'm pretty happy in that regard. 07/03/18 on evaluation today patient's wounds actually appear to be doing fairly well. He has been tolerating the dressing changes without complication. All in all he seems to be showing signs of improvement. In regard to the antibiotics he has been dealing with infectious disease since I saw him last week as far as getting this scheduled. In the end [facility]'s [facility] this coming Friday. In the meantime he has been continuing to perform the dressing changes in such as previous. There does not appear to be any evidence of infection worsengin at this time. 07/10/18; Since I last saw this man 2 weeks ago things have actually improved. IV antibiotics of resulted in less forefoot erythema although there is still some present. He is not systemically unwell Left buttock wounds 2 now have no depth there is increased epithelialization Using silver alginate Left heel still requires debridement using Iodoflex Left dorsal foot still with a sizable wound about the size  of a border but healthy granulation Right dorsal foot still with a slitlike area using silver alginate 07/18/18; the patient's cellulitis in the left foot is improved in fact I think it is on its way to resolving. Left buttock wounds 2 both look better although the larger one has hypertension granulation we've been using silver alginate Left heel has some thick circumferential redundant skin over the wound edge which will need to be removed today we've been using Iodoflex Left dorsal foot is still a sizable wound required debridement using silver alginate The right dorsal foot is just about closed only a small open area remains here 07/25/18; left foot cellulitis is resolved Left buttock wounds 2 both look better. Hyper-granulation on the major area Left heel as some debris over the surface but otherwise looks a healthier wound. Using silver collagen Right dorsal foot is just about closed 07/31/18; arrives with our intake nurse worried about purulent drainage from the buttock.  We had hyper-granulation here last week His buttock wounds 2 continue to look better Left heel some debris over the surface but measuring smaller. Right dorsal foot unfortunately has openings between the toes Left foot superficial wound looks less aggravated. 08/07/18 Buttock wounds continue to look better although some of her granulation and the larger medial wound. silver alginate Left heel continues to look a lot better.silver collagen Left foot superficial wound looks less stable. Requires debridement. He has a new wound superficial area on the foot on the lateral dorsal foot. Right foot looks better using silver alginate without Lotrisone 08/14/2018; patient was in the ER last week diagnosed with a UTI. He is now on Cefpodoxime and Macrodantin. Buttock wounds continued to be smaller. Using silver alginate Left heel continues to look better using silver collagen Left foot superficial wound looks as though it is  improving Right dorsal foot area is just about healed. 08/21/2018; patient is completed his antibiotics for his UTI. He has 2 open areas on the buttocks. There is still not closed although the surface looks satisfactory. Using silver alginate Left heel continues to improve using silver collagen The bilateral dorsal foot areas which are at the base of his first and second toes/possible tinea pedis are actually stable on the left but worse on the right. The area on the left required debridement of necrotic surface. After debridement I obtained a specimen for PCR culture. The right dorsal foot which is been just about healed last week is now reopened 08/28/2018; culture done on the left dorsal foot showed coag negative staph both staph epidermidis and Lugdunensis. I think this is worthwhile initiating systemic treatment. I will use doxycycline given his long list of allergies. The area on the left heel slightly improved but still requiring debridement. The large wound on the buttock is just about closed whereas the smaller one is larger. Using silver alginate in this area 09/04/2018; patient is completing his doxycycline for the left foot although this continues to be a very difficult wound area with very adherent necrotic debris. We are using silver alginate to all his wounds right foot left foot and the small wounds on his buttock, silver collagen on the left heel. 09/11/2018; once again this patient has intense erythema and swelling of the left forefoot. Lesser degrees of erythema in the right foot. He has a long list of allergies and intolerances. I will reinstitute doxycycline. 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 Using silver alginate Left heel also looks better using silver collagen Unfortunately both the areas on his feet look worse. The area on the left first second webspace is now gone through to the plantar part of his foot. The area  on the left foot anteriorly is irritated with erythema and swelling in the forefoot. 09/25/2018 His wound on the left plantar heel looks better. Using silver collagen The area on the left buttock 2 small remnant areas. One is closed one is still open. Using silver alginate The areas between both his first and second toes look worse. This in spite of long-standing antifungal therapy with ketoconazole and silver alginate which should have antifungal activity He has small areas around his original wound on the left calf one is on the bottom of the original scar tissue and one superiorly both of these are small and superficial but again given wound history in this site this is worrisome 10/02/2018 Left plantar heel continues to gradually contract using  silver collagen Left buttock wound is unchanged using silver alginate The areas on his dorsal feet between his first and second toes bilaterally look about the same. I prescribed clindamycin ointment to [name] if we can address chronic staph colonization and also the underlying possibility of erythrasma The left lateral lower extremity wound is actually on the lateral part of his ankle. Small open area here. We have been using silver alginate 10/09/2018; Left plantar heel continues to look healthy and contract. No debridement is required Left buttock slightly smaller with a tape injury wound just below which was new this week Dorsal feet somewhat improved I have been using clindamycin Left lateral looks lower extremity the actual open area looks worse although a lot of this is epithelialized. I am going to change to silver collagen today He has a lot more swelling in the right leg although this is not pitting not red and not particularly warm there is a lot of spasm in the right leg usually indicative of people with paralysis of some underlying discomfort. We have reviewed his vascular status from 2017 he had a left greater saphenous vein ablation. I  wonder about referring him back to vascular surgery if the area on the left leg continues to deteriorate. 10/16/2018 in today for follow-up and management of multiple lower extremity ulcers. His left Buttock wound is much lower smaller and almost closed completely. The wound to the left ankle has began to reopen with Epithelialization and some adherent slough. He has multiple new areas to the left foot and leg. The left dorsal foot without much improvement. Wound present between left great webspace and 2nd toe. Erythema and edema present right leg. Right LE ultrasound obtained on 10/10/18 was negative for DVT. 10/23/2018; Left buttock is closed over. Still dry macerated skin but there is no open wound. I suspect this is chronic pressure/moisture Left lateral calf is quite a bit worse than when I saw this last. There is clearly drainage here he has macerated skin into the left plantar heel. We will change the primary dressing to alginate Left dorsal foot has some improvement in overall wound area. Still using clindamycin and silver alginate Right dorsal foot about the same as the left using clindamycin and silver alginate The erythema in the right leg has resolved. He is DVT rule out was negative Left heel pressure area required debridement although the wound is smaller and the surface is health 10/26/2018 The patient came back in for his nurse check today predominantly because of the drainage coming out of the left lateral leg with a recent reopening of his original wound on the left lateral calf. He comes in today with a large amount of surrounding erythema around the wound extending from the calf into the ankle and even in the area on the dorsal foot. He is not systemically unwell. He is not febrile. Nevertheless this looks like cellulitis. We have been using silver alginate to the area. I changed him to a regular visit and I am going to prescribe him doxycycline. The rationale here is a long  list of medication intolerances and a history of MRSA. I did not [name] anything that I thought would provide a valuable culture 10/30/2018 Follow-up from his appointment 4 days ago with really an extensive area of cellulitis in the left calf left lateral ankle and left dorsal foot. I put him on doxycycline. He has a long list of medication allergies which are true allergy reactions. 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 In any case he is a lot better today. The erythema especially in the anterior and lateral left calf is better. He still has left ankle erythema. He also is complaining about increasing edema in the right leg we have only been using Kerlix Coban and he has been doing the wraps at home. Finally he has a spotty rash on the medial part of his upper left calf which looks like folliculitis or perhaps wrap occlusion type injury. Small superficial macules not pustules 11/06/18 patient arrives today with again a considerable degree of erythema around the wound on the left lateral calf extending into the dorsal ankle and dorsal foot. This is a lot worse than when I saw this last week. He is on doxycycline really with not a lot of improvement. He has not been systemically unwell Wounds on the; left heel actually looks improved. Original area on the left foot and proximity to the first and second toes looks about the same. He has superficial areas on the dorsal foot, anterior calf and then the reopening of his original wound on the left lateral calf which looks about the same The only area he has on the right is the dorsal webspace first and second which is smaller. He has a large area of dry erythematous skin on the left buttock small open area here. 11/13/2018; the patient arrives in much better condition. The erythema around the wound on the left lateral calf is a lot better. Not sure whether this was the clindamycin or  the TCA and ketoconazole or just in the improvement in edema control [stasis dermatitis]. In any case this is a lot better. The area on the left heel is very small and just about resolved using silver collagen we have been using silver alginate to the areas on his dorsal feet 11/20/2018; his wounds include the left lateral calf, left heel, dorsal aspects of both feet just proximal to the first second webspace. He is stable to slightly improved. I did not think any changes to his dressings were going to be necessary 11/27/2018 he has a reopening on the left buttock which is surrounded by what looks like tinea or perhaps some other form of dermatitis. The area on the left dorsal foot has some erythema around it I have marked this area but I am not sure whether this is cellulitis or not. Left heel is not closed. Left calf the reopening is really slightly longer and probably worse 1/13; in general things look better and smaller except for the left dorsal foot. Area on the left heel is just about closed, left buttock looks better only a small wound remains in the skin looks better [using Lotrisone] 1/20; the area on the left heel only has a few remaining open areas here. Left lateral calf about the same in terms of size, left dorsal foot slightly larger right lateral foot still not closed. The area on the left buttock has no open wound and the surrounding skin looks a lot better 1/27; the area on the left heel is closed. Left lateral calf better but still requiring extensive debridements. The area on his left buttock is closed. He still has the open areas on the left dorsal foot which is slightly smaller in the right foot which is slightly expanded. We have been using Iodoflex on these areas as well 2/3; left heel is closed. Left lateral calf still requiring debridement using Iodoflex there is  no open area on his left buttock however he has dry scaly skin over a large area of this. Not really responding  well to the Lotrisone. Finally the areas on his dorsal feet at the level of the first second webspace are slightly smaller on the right and about the same on the left. Both of these vigorously debrided with Anasept and gauze 2/10; left heel remains closed he has dry erythematous skin over the left buttock but there is no open wound here. Left lateral leg has come in and with. Still requiring debridement we have been using Iodoflex here. Finally the area on the left dorsal foot and right dorsal foot are really about the same extremely dry callused fissured areas. He does not yet have a dermatology appointment 2/17; left heel remains closed. He has a new open area on the left buttock. The area on the left lateral calf is bigger longer and still covered in necrotic debris. No major change in his foot areas bilaterally. I am awaiting for a dermatologist to look on this. We have been using ketoconazole I do not know that this is been doing any good at all. 2/24; left heel remains closed. The left buttock wound that was new reopening last week looks better. The left lateral calf appears better also although still requires debridement. The major area on his foot is the left first second also requiring debridement. We have been putting Prisma on all wounds. I do not believe that the ketoconazole has done too much good for his feet. He will use Lotrisone I am going to give him a 2-week course of terbinafine. We still do not have a dermatology appointment 3/2 left heel remains closed however there is skin over bone in this area I pointed this out to him today. The left buttock wound is epithelialized but still does not look completely stable. The area on the left leg required debridement were using silver collagen here. With regards to his feet we changed to Lotrisone last week and silver alginate. 3/9; left heel remains closed. Left buttock remains closed. The area on the right foot is essentially closed.  The left foot remains unchanged. Slightly smaller on the left lateral calf. Using silver collagen to both of these areas 3/16-Left heel remains closed. Area on right foot is closed. Left lateral calf above the lateral malleolus open wound requiring debridement with easy bleeding. Left dorsal wound proximal to first toe also debrided. Left ischial area open new. Patient has been using Prisma with wrapping every 3 days. Dermatology appointment is apparently tomorrow.Patient has completed his terbinafine 2-week course with some apparent improvement according to him, there is still flaking and dry skin in his foot on the left 3/23; area on the right foot is reopened. The area on the left anterior foot is about the same still a very necrotic adherent surface. He still has the area on the left leg and reopening is on the left buttock. He apparently saw dermatology although I do not have a note. According to the patient who is usually fairly well informed they did not have any good ideas. Put him on oral terbinafine which she is been on before. 3/30; using silver collagen to all wounds. Apparently his dermatologist put him on doxycycline and rifampin presumably some culture grew staph. I do not have this result. He remains on terbinafine although I have used terbinafine on him before 4/6; patient has had a fairly substantial reopening on the right foot between the first  and second toes. He is finished his terbinafine and I believe is on doxycycline and rifampin still as prescribed by dermatology. We have been using silver collagen to all his wounds although the patient reports that he thinks silver alginate does better on the wounds on his buttock. 4/13; the area on his left lateral calf about the same size but it did not require debridement. Left dorsal foot just proximal to the webspace between the first and second toes is about the same. Still nonviable surface. I note some superficial bronze  discoloration of the dorsal part of his foot Right dorsal foot just proximal to the first and second toes also looks about the same. I still think there may be the same discoloration I noted above on the left Left buttock wound looks about the same 4/20; left lateral calf appears to be gradually contracting using silver collagen. 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 The left dorsal foot wound is debrided of tightly adherent necrotic debris and really cleans up quite nicely. The right area is worse with expansion. I did not debride this it is now over the base of the second toe The area on his left buttock is smaller no debridement is required using silver collagen 5/4; left calf continues to make good progress. He arrives with erythema around the wounds on his dorsal foot which even extends to the plantar aspect. Very concerning for coexistent infection. He is finished the erythromycin I gave him for possible erythrasma this does not seem to have helped. The area on the left foot is about the same base of the dorsal toes Is area on the buttock looks improved on the left 5/11; left calf and left buttock continued to make good progress. Left foot is about the same to slightly improved. Major problem is on the right foot. He has not had an x-ray. Deep tissue culture I did last week showed both Enterobacter and E. coli. I did not change the doxycycline I put him on empirically although neither 1 of these were plated to doxycycline. He arrives today with the erythema looking worse on both the dorsal and plantar foot. Macerated skin on the bottom of the foot. he has not been systemically unwell 5/18-Patient returns at 1 week, left calf wound appears to be making some progress, left buttock wound appears slightly worse than last time, left foot wound looks slightly better, right foot redness is marginally better. X-ray of both feet show no  air or evidence of osteomyelitis. Patient is finished his Omnicef and terbinafine. He continues to have macerated skin on the bottom of the left foot as well as right 5/26; left calf wound is better, left buttock wound appears to have multiple small superficial open areas with surrounding macerated skin. X-rays that I did last time showed no evidence of osteomyelitis in either foot. He is finished cefdinir and doxycycline. I do not think that he was on terbinafine. He continues to have a large superficial open area on the right foot anterior dorsal and slightly between the first and second toes. I did send him to dermatology 2 months ago or so wondering about whether they would do a fungal scraping. I do not believe they did but did do a culture. We have been using silver alginate to the toe areas, he has been using antifungals at home topically either ketoconazole or Lotrisone. We are using silver collagen on the left foot, silver alginate on the right, silver  collagen on the left lateral leg and silver alginate on the left buttock 6/1; left buttock area is healed. We have the left dorsal foot, left lateral leg and right dorsal foot. We are using silver alginate to the areas on both feet and silver collagen to the area on his left lateral calf 6/8; the left buttock apparently reopened late last week. He is not really sure how this happened. He is tolerating the terbinafine. Using silver alginate to all wounds 6/15; left buttock wound is larger than last week but still superficial. Came in the clinic today with a report of purulence from the left lateral leg I did not identify any infection Both areas on his dorsal feet appear to be better. He is tolerating the terbinafine. Using silver alginate to all wounds 6/22; left buttock is about the same this week, left calf quite a bit better. His left foot is about the same however he comes in with erythema and warmth in the right forefoot once again.  Culture that I gave him in the beginning of May showed Enterobacter and E. coli. I gave him doxycycline and things seem to improve although neither 1 of these organisms was specifically plated. 6/29; left buttock is larger and dry this week. Left lateral calf looks to me to be improved. Left dorsal foot also somewhat improved right foot completely unchanged. The erythema on the right foot is still present. He is completing the Ceftin dinner that I gave him empirically [[name] discussion above.) 7/6 - All wounds look to be stable and perhaps improved, the left buttock wound is slightly smaller, per patient bleeds easily, completed ceftin, the right foot redness is less, he is on terbinafine 7/13; left buttock wound about the same perhaps slightly narrower. Area on the left lateral leg continues to narrow. Left dorsal foot slightly smaller right foot about the same. We are using silver alginate on the right foot and Hydrofera Blue to the areas on the left. Unna boot on the left 2 layer compression on the right 7/20; left buttock wound absolutely the same. Area on lateral leg continues to get better. Left dorsal foot require debridement as did the right no major change in the 7/27; left buttock wound the same size necrotic debris over the surface. The area on the lateral leg is closed once again. His left foot looks better right foot about the same although there is some involvement now of the posterior first second toe area. He is still on terbinafine which I have given him for a month, not certain a centimeter major change 06/25/19-All wounds appear to be slightly improved according to report, left buttock wound looks clean, both foot wounds have minimal to no debris the right dorsal foot has minimal slough. We are using Hydrofera Blue to the left and silver alginate to the right foot and ischial wound. 8/10-Wounds all appear to be around the same, the right forefoot distal part has some redness which  was not there before, however the wound looks clean and small. Ischial wound looks about the same with no changes 8/17; his wound on the left lateral calf which was his original chronic venous insufficiency wound remains closed. Since I last saw him the areas on the left dorsal foot right dorsal foot generally appear better but require debridement. The area on his left initial tuberosity appears somewhat larger to me perhaps hyper granulated and bleeds very easily. We have been using Hydrofera Blue to the left dorsal foot and silver alginate  to everything else 8/24; left lateral calf remains closed. The areas on his dorsal feet on the webspace of the first and second toes bilaterally both look better. The area on the left buttock which is the pressure ulcer stage II slightly smaller. I change the dressing to Hydrofera Blue to all areas 8/31; left lateral calf remains closed. The area on his dorsal feet bilaterally look better. Using Hydrofera Blue. Still requiring debridement on the left foot. No change in the left buttock pressure ulcers however 9/14; left lateral calf remains closed. Dorsal feet look quite a bit better than 2 weeks ago. Flaking dry skin also a lot better with the ammonium lactate I gave him 2 weeks ago. The area on the left buttock is improved. He states that his Roho cushion developed a leak and he is getting a new one, in the interim he is offloading this vigorously 9/21; left calf remains closed. Left heel which was a possible DTI looks better this week. He had macerated tissue around the left dorsal foot right foot looks satisfactory and improved left buttock wound. I changed his dressings to his feet to silver alginate bilaterally. Continuing Hydrofera Blue on the left buttock. 9/28 left calf remains closed. Left heel did not develop anything [possible DTI] dry flaking skin on the left dorsal foot. Right foot looks satisfactory. Improved left buttock wound. We are using  silver alginate on his feet Hydrofera Blue on the buttock. I have asked him to go back to the Lotrisone on his feet including the wounds and surrounding areas 10/5; left calf remains closed. The areas on the left and right feet about the same. A lot of this is epithelialized however debris over the remaining open areas. He is using Lotrisone and silver alginate. The area on the left buttock using Hydrofera Blue 10/26. Patient has been out for 3 weeks secondary to Covid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 has a single linear wound on the lateral left leg. This does not look to be a lot changed from when I lost saw this. The area on his dorsal left foot looks considerably better however. 08/02/16; no major change in the substantial area on his left lateral leg since last time. We have been using Hydrofera Blue for a prolonged period of time now. The area on his left foot is also unchanged from last review 07/19/16; the area on his dorsal foot on the left looks considerably smaller. He is beginning to have significant rims of epithelialization on the lateral left leg wound. This also looks better. 08/05/16; the patient came in for  a nurse visit today. Apparently the area on his left lateral leg looks better and it was wrapped. However in general discussion the patient noted a new area on the dorsal aspect of his right second toe. The exact etiology of this is unclear but likely relates to pressure. 08/09/16 really the area on the left lateral leg did not really look that healthy today perhaps slightly larger and measurements. The area on his dorsal right second toe is improved also the left foot wound looks stable to improved 08/16/16; the area on the last lateral leg did not change any of dimensions. Post debridement with a curet the area looked better. Left foot wound improved and the area on the dorsal right second toe is improved 08/23/16; the area on the left lateral leg may be slightly smaller both in terms of length and width. Aggressive debridement with a curette afterwards the tissue appears healthier. Left foot wound appears improved in the area on the dorsal right second toe is improved 08/30/16 patient developed a fever over the weekend and was seen in an urgent care. Felt to have a UTI and put on doxycycline. He has been since changed over the phone to [facility]. After we took off the wrap on his right leg today the leg is swollen warm and erythematous, probably more likely the source of the fever 09/06/16; have been using collagen to the major left leg wound, silver alginate to the area on his anterior foot/toes 09/13/16; the areas on his anterior foot/toes on both sides appear to be virtually closed. Extensive wound on the left lateral leg perhaps slightly narrower but each visit still covered an adherent surface slough 09/16/16 patient was in for his usual Thursday nurse visit however the intake nurse noted significant erythema of his dorsal right foot. He is also running a low-grade fever and having increasing spasms in the right leg 09/20/16 here for cellulitis involving his right great toes and forefoot. This  is a lot better. Still requiring debridement on his left lateral leg. Santyl direct says he needs prior authorization. Therefore his wife cannot change this at home 09/30/16; the patient's extensive area on the left lateral calf and ankle perhaps somewhat better. Using Santyl. The area on the left toes is healed and I think the area on his right dorsal foot is healed as well. There is no cellulitis or venous inflammation involving the right leg. He is going to need compression stockings here. 10/07/16; the  patient's extensive wound on the left lateral calf and ankle does not measure any differently however there appears to be less adherent surface slough using Santyl and aggressive weekly debridements 10/21/16; no major change in the area on the left lateral calf. Still the same measurement still very difficult to debridement adherent slough and nonviable subcutaneous tissue. This is not really been helped by several weeks of Santyl. Previously for 2 weeks I used Iodoflex for a short period. A prolonged course of Hydrofera Blue didn't really help. I'm not sure why I only used 2 weeks of Iodoflex on this there is no evidence of surrounding infection. He has a small area on the right second toe which looks as though it's progressing towards closure 10/28/16; the wounds on his toes appear to be closed. No major change in the left lateral leg wound although the surface looks somewhat better using Iodoflex. He has had previous arterial studies that were normal. He has had reflux studies and is status post ablation although I don't have any exact notes on which vein was ablated. I'll need to check the surgical record 11/04/16; he's had a reopening between the first and second toe on the left and right. No major change in the left lateral leg wound. There is what appears to be cellulitis of the left dorsal foot 11/18/16 the patient was hospitalized initially in [place] and then subsequently transferred to  [facility] long and was admitted there from 11/09/16 through 11/12/16. He had developed progressive cellulitis on the right leg in spite of the doxycycline I gave him. I'd spoken to the hospitalist in [place] who was concerned about continuing leukocytosis. CT scan is what I suggested this was done which showed soft tissue swelling without evidence of osteomyelitis or an underlying abscess blood cultures were negative. At [facility] he was treated with vancomycin and Primaxin and then add an infectious disease consult. He was transitioned to Ceftaroline. He has been making progressive improvement. Overall a severe cellulitis of the right leg. He is been using silver alginate to her original wound on the left leg. The wounds in his toes on the right are closed there is a small open area on the base of the left second toe 11/26/15; the patient's right leg is much better although there is still some edema here this could be reminiscent from his severe cellulitis likely on top of some degree of lymphedema. His left anterior leg wound has less surface slough as reported by her intake nurse. Small wound at the base of the left second toe 12/02/16; patient's right leg is better and there is no open wound here. His left anterior lateral leg wound continues to have a healthy-looking surface. Small wound at the base of the left second toe however there is erythema in the left forefoot which is worrisome 12/16/16; is no open wounds on his right leg. We took measurements for stockings. His left anterior lateral leg wound continues to have a healthy-looking surface. I'm not sure where we were with the Apligraf run through his insurance. We have been using Iodoflex. He has a thick eschar on the left first second toe interface, I suspect this may be fungal however there is no visible open 12/23/16; no open wound on his right leg. He has 2 small areas left of the linear wound that was remaining last week. We have been  using Prisma, I thought I have disclosed this week, we can only look forward to next week 01/03/17; the patient had  concerning areas of erythema last week, already on doxycycline for UTI through his primary doctor. The erythema is absolutely no better there is warmth and swelling both medially from the left lateral leg wound and also the dorsal left foot. 01/06/17- Patient is here for follow-up evaluation of his left lateral leg ulcer and bilateral feet ulcers. He is on oral antibiotic therapy, tolerating that. Nursing staff and the patient states that the erythema is improved from Monday. 01/13/17; the predominant left lateral leg wound continues to be problematic. I had put Apligraf on him earlier this month once. However he subsequently developed what appeared to be an intense cellulitis around the left lateral leg wound. I gave him Dalvance I think on 2/12 perhaps 2/13 he continues on cefdinir. The erythema is still present but the warmth and swelling is improved. I am hopeful that the cellulitis part of this control. I wouldn't be surprised if there is an element of venous inflammation as well. 01/17/17. The erythema is present but better in the left leg. His left lateral leg wound still does not have a viable surface buttons certain parts of this long thin wound it appears like there has been improvement in dimensions. 01/20/17; the erythema still present but much better in the left leg. I'm thinking this is his usual degree of chronic venous inflammation. The wound on the left leg looks somewhat better. Is less surface slough 01/27/17; erythema is back to the chronic venous inflammation. The wound on the left leg is somewhat better. I am back to the point where I like to try an Apligraf once again 02/10/17; slight improvement in wound dimensions. Apligraf #2. He is completing his doxycycline 02/14/17; patient arrives today having completed doxycycline last Thursday. This was supposed to be a nurse  visit however once again he hasn't tense erythema from the medial part of his wound extending over the lower leg. Also erythema in his foot this is roughly in the same distribution as last time. He has baseline chronic venous inflammation however this is a lot worse than the baseline I have learned to accept the on him is baseline inflammation 02/24/17- patient is here for follow-up evaluation. He is tolerating compression therapy. His voicing no complaints or concerns he is here anticipating an Apligraf 03/03/17; he arrives today with an adherent necrotic surface. I don't think this is surface is going to be amenable for Apligraf's. The erythema around his wound and on the left dorsal foot has resolved he is off antibiotics 03/10/17; better-looking surface today. I don't think he can tolerate Apligraf's. He tells me he had a wound VAC after a skin graft years ago to this area and they had difficulty with a seal. The erythema continues to be stable around this some degree of chronic venous inflammation but he also has recurrent cellulitis. We have been using Iodoflex 03/17/17; continued improvement in the surface and may be small changes in dimensions. Using Iodoflex which seems the only thing that will control his surface 03/24/17- He is here for follow up evaluation of his LLE lateral ulceration and ulcer to right dorsal foot/toe space. He is voicing no complaints or concerns, He is tolerating compression wrap. 03/31/17 arrives today with a much healthier looking wound on the left lower extremity. We have been using Iodoflex for a prolonged period of time which has for the first time prepared and adequate looking wound bed although we have not had much in the way of wound dimension improvement. He also has a  small wound between the first and second toe on the right 04/07/17; arrives today with a healthy-looking wound bed and at least the top 50% of this wound appears to be now her. No debridement was  required I have changed him to [facility] last week after prolonged Iodoflex. He did not do well with Apligraf's. We've had a re-opening between the first and second toe on the right 04/14/17; arrives today with a healthier looking wound bed contractions and the top 50% of this wound and some on the lesser 50%. Wound bed appears healthy. The area between the first and second toe on the right still remains problematic 04/21/17; continued very gradual improvement. Using [facility] 04/28/17; continued very gradual improvement in the left lateral leg venous insufficiency wound. His periwound erythema is very mild. We have been using Hydrofera Blue. Wound is making progress especially in the superior 50% 05/05/17; he continues to have very gradual improvement in the left lateral venous insufficiency wound. Both in terms with an length rings are improving. I debrided this every 2 weeks with #5 curet and we have been using Hydrofera Blue and again making good progress With regards to the wounds between his right first and second toe which I thought might of been tinea pedis he is not making as much progress very dry scaly skin over the area. Also the area at the base of the left first and second toe in a similar condition 05/12/17; continued gradual improvement in the refractory left lateral venous insufficiency wound on the left. Dimension smaller. Surface still requiring debridement using Hydrofera Blue 05/19/17; continued gradual improvement in the refractory left lateral venous ulceration. Careful inspection of the wound bed underlying rumination suggested some degree of epithelialization over the surface no debridement indicated. Continue Hydrofera Blue difficult areas between his toes first and third on the left than first and second on the right. I'm going to change to silver alginate from silver collagen. Continue ketoconazole as I suspect underlying tinea pedis 05/26/17; left lateral leg  venous insufficiency wound. We've been using Hydrofera Blue. I believe that there is expanding epithelialization over the surface of the wound albeit not coming from the wound circumference. This is a bit of an odd situation in which the epithelialization seems to be coming from the surface of the wound rather than in the exact circumference. There is still small open areas mostly along the lateral margin of the wound. ooHe has unchanged areas between the left first and second and the right first second toes which I been treating for tenia pedis 06/02/17; left lateral leg venous insufficiency wound. We have been using Hydrofera Blue. Somewhat smaller from the wound circumference. The surface of the wound remains a bit on it almost epithelialized sedation in appearance. I use an open curette today debridement in the surface of all of this especially the edges ooSmall open wounds remaining on the dorsal right first and second toe interspace and the plantar left first second toe and her face on the left 06/09/17; wound on the left lateral leg continues to be smaller but very gradual and very dry surface using Hydrofera Blue 06/16/17 requires weekly debridements now on the left lateral leg although this continues to contract. I changed to silver collagen last week because of dryness of the wound bed. Using Iodoflex to the areas on his first and second toes/web space bilaterally 06/24/17; patient with history of paraplegia also chronic venous insufficiency with lymphedema. Has a very difficult wound on the left  lateral leg. This has been gradually reducing in terms of with but comes in with a very dry adherent surface. High switch to silver collagen a week or so ago with hydrogel to keep the area moist. This is been refractory to multiple dressing attempts. He also has areas in his first and second toes bilaterally in the anterior and posterior web space. I had been using Iodoflex here after a prolonged  course of silver alginate with ketoconazole was ineffective [question tinea pedis] 07/14/17; patient arrives today with a very difficult adherent material over his left lateral lower leg wound. He also has surrounding erythema and poorly controlled edema. He was switched his Santyl last visit which the nurses are applying once during his doctor visit and once on a nurse visit. He was also reduced to 2 layer compression I'm not exactly sure of the issue here. 07/21/17; better surface today after 1 week of Iodoflex. Significant cellulitis that we treated last week also better. [Doxycycline] 07/28/17 better surface today with now 2 weeks of Iodoflex. Significant cellulitis treated with doxycycline. He has now completed the doxycycline and he is back to his usual degree of chronic venous inflammation/stasis dermatitis. He reminds me he has had ablations surgery here 08/04/17; continued improvement with Iodoflex to the left lateral leg wound in terms of the surface of the wound although the dimensions are better. 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 Problematic areas on the plantar aspect of the first second toe web space on the left and the dorsal aspect of the first second toe web space on the right. At one point I felt these were probably related to chronic fungal infections in treated him aggressively for this although we have not made any improvement here. 08/11/17; left lateral leg. Surface continues to improve with the Iodoflex although we are not seeing much improvement in overall wound dimensions. Areas on his plantar left foot and right foot show no improvement. In fact the right foot looks somewhat worse 08/18/17; left lateral leg. We changed to [facility] Blue last week after a prolonged course of Iodoflex which helps get the surface better. It appears that the wound with is improved. Continue with difficult areas on the  left dorsal first second and plantar first second on the right 09/01/17; patient arrives in clinic today having had a temperature of 103 yesterday. He was seen in the ER and [facility]. The patient was concerned he could have cellulitis again in the right leg however they diagnosed him with a UTI and he is now on Keflex. He has a history of cellulitis which is been recurrent and difficult but this is been in the left leg, in the past 5 use doxycycline. He does in and out catheterizations at home which are risk factors for UTI 09/08/17; patient will be completing his Keflex this weekend. The erythema on the left leg is considerably better. He has a new wound today on the medial part of the right leg small superficial almost looks like a skin tear. He has worsening of the area on the right dorsal first and second toe. His major area on the left lateral leg is better. Using Hydrofera Blue on all areas 09/15/17; gradual reduction in width on the long wound in the left lateral leg. No debridement required. He also has wounds on the plantar aspect of his left first second toe web space and on the dorsal aspect of the right first second  toe web space. 09/22/17; there continues to be very gradual improvements in the dimensions of the left lateral leg wound. He hasn't round erythematous spot with might be pressure on his wheelchair. There is no evidence obviously of infection no purulence no warmth ooHe has a dry scaled area on the plantar aspect of the left first second toe ooImproved area on the dorsal right first second toe. 09/29/17; left lateral leg wound continues to improve in dimensions mostly with an is still a fairly long but increasingly narrow wound. ooHe has a dry scaled area on the plantar aspect of his left first second toe web space ooIncreasingly concerning area on the dorsal right first second toe. In fact I am concerned today about possible cellulitis around this wound. The areas  extending up his second toe and although there is deformities here almost appears to abut on the nailbed. 10/06/17; left lateral leg wound continues to make very gradual progress. Tissue culture I did from the right first second toe dorsal foot last time grew MRSA and enterococcus which was vancomycin sensitive. This was not sensitive to clindamycin or doxycycline. He is allergic to Zyvox and sulfa we have therefore arrange for him to have dalvance infusion tomorrow. He is had this in the past and tolerated it well 10/20/17; left lateral leg wound continues to make decent progress. This is certainly reduced in terms of with there is advancing epithelialization.ooThe cellulitis in the right foot looks better although he still has a deep wound in the dorsal aspect of the first second toe web space. Plantar left first toe web space on the left I think is making some progress 10/27/17; left lateral leg wound continues to make decent progress. Advancing epithelialization.using Hydrofera Blue ooThe right first second toe web space wound is better-looking using silver alginate ooImprovement in the left plantar first second toe web space. Again using silver alginate 11/03/17 left lateral leg wound continues to make decent progress albeit slowly. Using Hydrofera Blue ooThe right per second toe web space continues to be a very problematic looking punched out wound. I obtained a piece of tissue for deep culture I did extensively treated this for fungus. It is difficult to imagine that this is a pressure area as the patient states other than going outside he doesn't really wear shoes at home ooThe left plantar first second toe web space looked fairly senescent. Necrotic edges. This required debridement oochange to Hydrofera Blue to all wound areas 11/10/17; left lateral leg wound continues to contract. Using Hydrofera Blue ooOn the right dorsal first second toe web space dorsally. Culture I did of this  area last week grew MRSA there is not an easy oral option in this patient was multiple antibiotic allergies or intolerances. This was only a rare culture isolate I'm therefore going to use Bactroban under silver alginate ooOn the left plantar first second toe web space. Debridement is required here. This is also unchanged 11/17/17; left lateral leg wound continues to contract using Hydrofera Blue this is no longer the major issue. ooThe major concern here is the right first second toe web space. He now has an open area going from dorsally to the plantar aspect. There is now wound on the inner lateral part of the first toe. Not a very viable surface on this. There is erythema spreading medially into the forefoot. ooNo major change in the left first second toe plantar wound 11/24/17; left lateral leg wound continues to contract using Hydrofera Blue. Nice improvement today ooThe  right first second toe web space all of this looks a lot less angry than last week. I have given him clindamycin and topical Bactroban for MRSA and terbinafine for the possibility of underlining tinea pedis that I could not control with ketoconazole. Looks somewhat better ooThe area on the plantar left first second toe web space is weeping with dried debris around the wound 12/01/17; left lateral leg wound continues to contract he Hydrofera Blue. It is becoming thinner in terms of with nevertheless it is making good improvement. ooThe right first second toe web space looks less angry but still a large necrotic-looking wounds starting on the plantar aspect of the right foot extending between the toes and now extensively on the base of the right second toe. I gave him clindamycin and topical Bactroban for MRSA anterior benefiting for the possibility of underlying tinea pedis. Not looking better today ooThe area on the left first/second toe looks better. Debrided of necrotic debris 12/05/17* the patient was worked in  urgently today because over the weekend he found blood on his incontinence bad when he woke up. He was found to have an ulcer by his wife who does most of his wound care. He came in today for [place] to look at this. He has not had a history of wounds in his buttocks in spite of his paraplegia. 12/08/17; seen in follow-up today at his usual appointment. He was seen earlier this week and found to have a new wound on his buttock. We also follow him for wounds on the left lateral leg, left first second toe web space and right first second toe web space 12/15/17; we have been using Hydrofera Blue to the left lateral leg which has improved. The right first second toe web space has also improved. Left first second toe web space plantar aspect looks stable. The left buttock has worsened using Santyl. Apparently the buttock has drainage 12/22/17; we have been using Hydrofera Blue to the left lateral leg which continues to improve now 2 small wounds separated by normal skin. He tells [place] he had a fever up to 100 yesterday he is prone to UTIs but has not noted anything different. He does in and out catheterizations. The area between the first and second toes today does not look good necrotic surface covered with what looks to be purulent drainage and erythema extending into the third toe. I had gotten this to something that I thought look better last time however it is not look good today. He also has a necrotic surface over the buttock wound which is expanded. I thought there might be infection under here so I removed a lot of the surface with a #5 curet though nothing look like it really needed culturing. He is been using Santyl to this area 12/27/17; his original wound on the left lateral leg continues to improve using Hydrofera Blue. I gave him samples of Baxdella although he was unable to take them out of fear for an allergic reaction ["lump in his throat"].the culture I did of the purulent drainage from his  second toe last week showed both enterococcus and a set Enterobacter I was also concerned about the erythema on the bottom of his foot although paradoxically although this looks somewhat better today. 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 We've been using silver alginate here. They came up today that he sleeps in a recliner, I'm not  sure why but I asked him to stop this 01/03/18; his original wound we've been using Hydrofera Blue is now separated into 2 areas. ooUlcer on his left buttock is better he is off the recliner and sleeping in bed ooFinally both wound areas between his first and second toes also looks some better 01/10/18; his original wound on the left lateral leg is now separated into 2 wounds we've been using Hydrofera Blue ooUlcer on his left buttock has some drainage. There is a small probing site going into muscle layer superiorly.using silver alginate -He arrives today with a deep tissue injury on the left heel ooThe wound on the dorsal aspect of his first second toe on the left looks a lot betterusing silver alginate ketoconazole ooThe area on the first second toe web space on the right also looks a lot bette 01/17/18; his original wound on the left lateral leg continues to progress using Hydrofera Blue ooUlcer on his left buttock also is smaller surface healthier except for a small probing site going into the muscle layer superiorly. 2.4 cm of tunneling in this area ooDTI on his left heel we have only been offloading. Looks better than last week no threatened open no evidence of infection oothe wound on the dorsal aspect of the first second toe on the left continues to look like it's regressing we have only been using silver alginate and terbinafine orally ooThe area in the first second toe web space on the right also looks to be a lot better using silver alginate  and terbinafine I think this was prompted by tinea pedis 01/31/18; the patient was hospitalized in [place] last week apparently for a complicated UTI. He was discharged on cefepime he does in and out catheterizations. In the hospital he was discovered M I don't mild elevation of AST and ALTs and the terbinafine was stopped.predictably the pressure ulcer on his buttock looks betterusing silver alginate. The area on the left lateral leg also is better using Hydrofera Blue. The area between the first and second toes on the left better. First and second toes on the right still substantial but better. Finally the DTI on the left heel has held together and looks like it's resolving 02/07/18-he is here in follow-up evaluation for multiple ulcerations. He has new injury to the lateral aspect of the last issue a pressure ulcer, he states this is from adhesive removal trauma. He states he has tried multiple adhesive products with no success. All other ulcers appear stable. The left heel DTI is resolving. We will continue with same treatment plan and follow-up next week. 02/14/18; follow-up for multiple areas. ooHe has a new area last week on the lateral aspect of his pressure ulcer more over the posterior trochanter. The original pressure ulcer looks quite stable has healthy granulation. 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 Almost fully epithelialized on the original superior area using Hydrofera Blue ooDTI on the left heel has peeled off this week to reveal a small superficial wound under denuded skin and subcutaneous tissue ooBoth areas between the first and second toes look better including nothing open on the left 02/21/18; ooThe patient's wounds on his left ischial tuberosity and posterior left greater trochanter actually looked better. He has a large area of irritation around the area  which I think is contact dermatitis. I am doubtful that this is fungal ooHis original wound on the left lateral  calf continues to improve we have been using Hydrofera Blue ooThere is no open area in the left first second toe web space although there is a lot of thick callus ooThe DTI on the left heel required debridement today of necrotic surface eschar and subcutaneous tissue using silver alginate ooFinally the area on the right first second toe webspace continues to contract using silver alginate and ketoconazole 02/28/18 ooLeft ischial tuberosity wounds look better using silver alginate. ooOriginal wound on the left calf only has one small open area left using Hydrofera Blue ooDTI on the left heel required debridement mostly removing skin from around this wound surface. Using silver alginate ooThe areas on the right first/second toe web space using silver alginate and ketoconazole 03/08/18 on evaluation today patient appears to be doing decently well as best I can tell in regard to his wounds. This is the first time that I have seen him as he generally is followed by Dr. Dellia Nims. With that being said none of his wounds appear to be infected he does have an area where there is some skin covering what appears to be a new wound on the left dorsal surface of his great toe. This is right at the nail bed. With that being said I do believe that debrided away some of the excess skin can be of benefit in this regard. Otherwise he has been tolerating the dressing changes without complication. 03/14/18; patient arrives today with the multiplicity of wounds that we are following. He has not been systemically unwell ooOriginal wound on the left lateral calf now only has 2 small open areas we've been using Hydrofera Blue which should continue ooThe deep tissue injury on the left heel requires debridement today. We've been using silver alginate ooThe left first second toe and the right first second  toe are both are reminiscence what I think was tinea pedis. Apparently some of the callus Surface between the toes was removed last week when it started draining. ooPurulent drainage coming from the wound on the ischial tuberosity on the left. 03/21/18-He is here in follow-up evaluation for multiple wounds. 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 He tolerated debridement. The only change to last week's recommendations is to discontinue antifungal cream between toes. He will follow-up next week 03/28/18; following up for multiple wounds;Concern this week is streaking redness and swelling in the right foot. He is going to need antibiotics for this. 03/31/18; follow-up for right foot cellulitis. Streaking redness and swelling in the right foot on 03/28/18. 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 I put him on clindamycin 300 mg every 6 and brought him in for a quick check. He has an open wound between his first and second toes on the right foot as a potential source. 04/04/18; ooRight foot cellulitis is resolving he is completing clindamycin. This is truly good news ooLeft lateral calf wound which is initial wound only has one small open area inferiorly this is close to healing out. He has compression stockings. We will use Hydrofera Blue right down to the epithelialization of this ooNonviable surface on the left heel which was initially pressure with a DTI. We've been using Hydrofera Blue. I'm going to switch this back to silver alginate ooLeft first second toe/tinea pedis this looks better using silver alginate ooRight first second toe tinea pedis using silver alginate ooLarge pressure ulcers on theLeft ischial tuberosity. Small wound here Looks better. I am uncertain  about the surface over the large wound. Using silver alginate 04/11/18; ooCellulitis in the right foot is resolved ooLeft  lateral calf wound which was his original wounds still has 2 tiny open areas remaining this is just about closed ooNonviable surface on the left heel is better but still requires debridement ooLeft first second toe/tinea pedis still open using silver alginate ooRight first second toe wound tinea pedis I asked him to go back to using ketoconazole and silver alginate ooLarge pressure ulcers on the left ischial tuberosity this shear injury here is resolved. Wound is smaller. No evidence of infection using silver alginate 04/18/18; ooPatient arrives with an intense area of cellulitis in the right mid lower calf extending into the right heel area. Bright red and warm. Smaller area on the left anterior leg. He has a significant history of MRSA. He will definitely need antibioticsoodoxycycline ooHe now has 2 open areas on the left ischial tuberosity the original large wound and now a satellite area which I think was above his initial satellite areas. Not a wonderful surface on this satellite area surrounding erythema which looks like pressure related. ooHis left lateral calf wound again his original wound is just about closed ooLeft heel pressure injury still requiring debridement ooLeft first second toe looks a lot better using silver alginate ooRight first second toe also using silver alginate and ketoconazole cream also looks better 04/20/18; the patient was worked in early today out of concerns with his cellulitis on the right leg. I had started him on doxycycline. This was 2 days ago. His wife was concerned about the swelling in the area. Also concerned about the left buttock. He has not been systemically unwell no fever chills. No nausea vomiting or diarrhea 04/25/18; the patient's left buttock wound is continued to deteriorate he is using Hydrofera Blue. He is still completing clindamycin for the cellulitis on the right leg although all of this looks better. 05/02/18 ooLeft buttock  wound still with a lot of drainage and a very tightly adherent fibrinous necrotic surface. He has a deeper area superiorly ooThe left lateral calf wound is still closed ooDTI wound on the left heel necrotic surface especially the circumference using Iodoflex ooAreas between his left first second toe and right first second toe both look better. Dorsally and the right first second toe he had a necrotic surface although at smaller. In using silver alginate and ketoconazole. I did a culture last week which was a deep tissue culture of the reminiscence of the open wound on the right first second toe dorsally. This grew a few Acinetobacter and a few methicillin-resistant staph aureus. Nevertheless the area actually this week looked better. I didn't feel the need to specifically address this at least in terms of systemic antibiotics. 05/09/18; wounds are measuring larger more drainage per our intake. We are using Santyl covered with alginate on the large superficial buttock wounds, Iodosorb on the left heel, ketoconazole and silver alginate to the dorsal first and second toes bilaterally. 05/16/18; ooThe area on his left buttock better in some aspects although the area superiorly over the ischial tuberosity required an extensive debridement.using Santyl ooLeft heel appears stable. Using Iodoflex ooThe areas between his first and second toes are not bad however there is spreading erythema up the dorsal aspect of his left foot this looks like cellulitis again. He is insensate the erythema is really very brilliant.o Erysipelas He went to [name] allergist days ago because he was itching part of this he had lab  work done. This showed a white count of 15.1 with 70% neutrophils. Hemoglobin of 11.4 and a platelet count of 659,000. Last white count we had in [facility] was a 2-1/2 years ago which was 25.9 but he was ill at the time. He was able to show me some lab work that was done by his primary physician the  pattern is about the same. 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 But prompted me to go ahead and do x-rays of both feet and the pelvis rule out osteomyelitis. He also had a comprehensive metabolic panel this was reasonably normal his albumin was 3.7 liver function tests BUN/creatinine all normal 05/23/18; x-rays of both his feet from last week were negative for underlying pulmonary abnormality. The x-ray of his pelvis however showed mild irregularity in the left ischial which may represent some early osteomyelitis. The wound in the left ischial continues to get deeper clearly now exposed muscle. Each week necrotic surface material over this area. Whereas the rest of the wounds do not look so bad. ooThe left ischial wound we have been using Santyl and calcium alginate ooTo the left heel surface necrotic debris using Iodoflex ooThe left lateral leg is still healed ooAreas on the left dorsal foot and the right dorsal foot are about the same. There is some inflammation on the left which might represent contact dermatitis, fungal dermatitis I am doubtful cellulitis although this looks better than last week 05/30/18; CT scan done at Hospital did not show any osteomyelitis or abscess. Suggested the possibility of underlying cellulitis although I don't [name] a lot of evidence of this at the bedside ooThe wound itself on the left buttock/upper thigh actually looks somewhat better. No debridement ooLeft heel also looks better no debridement continue Iodoflex ooBoth dorsal first second toe spaces appear better using Lotrisone. Left still required debridement 06/06/18; ooIntake reported some purulent looking drainage from the left gluteal wound. Using Santyl and calcium alginate ooLeft heel looks better although still a nonviable surface requiring debridement ooThe left dorsal foot first/second webspace actually expanding and somewhat deeper. I may  consider doing a shave biopsy of this area ooRight dorsal foot first/second webspace appears stable to improved. Using Lotrisone and silver alginate to both these areas 06/13/18 ooLeft gluteal surface looks better. Now separated in the 2 wounds. No debridement required. Still drainage. We'll continue silver alginate ooLeft heel continues to look better with Iodoflex continue this for at least another week ooOf his dorsal foot wounds the area on the left still has some depth although it looks better than last week. We've been using Lotrisone and silver alginate 06/20/18 ooLeft gluteal continues to look better healthy tissue ooLeft heel continues to look better healthy granulation wound is smaller. He is using Iodoflex and his long as this continues continue the Iodoflex ooDorsal right foot looks better unfortunately dorsal left foot does not. There is swelling and erythema of his forefoot. He had minor trauma to this several days ago but doesn't think this was enough to have caused any tissue injury. Foot looks like cellulitis, we have had this problem before 06/27/18 on evaluation today patient appears to be doing a little worse in regard to his foot ulcer. 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 Both of which would really be his only options for treating  this infection. In the past he has been given and effusion of Orbactiv. This is done very well for him in the past again it's one time dosing IV antibiotic therapy. Subsequently I do believe this is something we're gonna need to [name] about doing at this point in time. Currently his other wounds seem to be doing somewhat better in my pinion I'm pretty happy in that regard. 07/03/18 on evaluation today patient's wounds actually appear to be doing fairly well. He has been tolerating the dressing  changes without complication. All in all he seems to be showing signs of improvement. In regard to the antibiotics he has been dealing with infectious disease since I saw him last week as far as getting this scheduled. In the end [facility]'s [facility] this coming Friday. In the meantime he has been continuing to perform the dressing changes in such as previous. There does not appear to be any evidence of infection worsengin at this time. 07/10/18; ooSince I last saw this man 2 weeks ago things have actually improved. IV antibiotics of resulted in less forefoot erythema although there is still some present. He is not systemically unwell ooLeft buttock wounds o2 now have no depth there is increased epithelialization Using silver alginate ooLeft heel still requires debridement using Iodoflex ooLeft dorsal foot still with a sizable wound about the size of a border but healthy granulation ooRight dorsal foot still with a slitlike area using silver alginate 07/18/18; the patient's cellulitis in the left foot is improved in fact I think it is on its way to resolving. ooLeft buttock wounds o2 both look better although the larger one has hypertension granulation we've been using silver alginate ooLeft heel has some thick circumferential redundant skin over the wound edge which will need to be removed today we've been using Iodoflex ooLeft dorsal foot is still a sizable wound required debridement using silver alginate ooThe right dorsal foot is just about closed only a small open area remains here 07/25/18; left foot cellulitis is resolved ooLeft buttock wounds o2 both look better. Hyper-granulation on the major area ooLeft heel as some debris over the surface but otherwise looks a healthier wound. Using silver collagen ooRight dorsal foot is just about closed 07/31/18; arrives with our intake nurse worried about purulent drainage from the  buttock. We had hyper-granulation here last week ooHis buttock wounds o2 continue to look better ooLeft heel some debris over the surface but measuring smaller. ooRight dorsal foot unfortunately has openings between the toes ooLeft foot superficial wound looks less aggravated. 08/07/18 ooButtock wounds continue to look better although some of her granulation and the larger medial wound. silver alginate ooLeft heel continues to look a lot better.silver collagen ooLeft foot superficial wound looks less stable. Requires debridement. He has a new wound superficial area on the foot on the lateral dorsal foot. ooRight foot looks better using silver alginate without Lotrisone 08/14/2018; patient was in the ER last week diagnosed with a UTI. He is now on Cefpodoxime and Macrodantin. ooButtock wounds continued to be smaller. Using silver alginate ooLeft heel continues to look better using silver collagen ooLeft foot superficial wound looks as though it is improving ooRight dorsal foot area is just about healed. 08/21/2018; patient is completed his antibiotics for his UTI. ooHe has 2 open areas on the buttocks. There is still not closed although the surface looks satisfactory. Using silver alginate ooLeft heel continues to improve using  silver collagen ooThe bilateral dorsal foot areas which are at the base of his first and second toes/possible tinea pedis are actually stable on the left but worse on the right. The area on the left required debridement of necrotic surface. After debridement I obtained a specimen for PCR culture. ooThe right dorsal foot which is been just about healed last week is now reopened 08/28/2018; culture done on the left dorsal foot showed coag negative staph both staph epidermidis and Lugdunensis. I think this is worthwhile initiating systemic treatment. I will use doxycycline given his long list of allergies. The area on the left heel slightly improved but  still requiring debridement. ooThe large wound on the buttock is just about closed whereas the smaller one is larger. Using silver alginate in this area 09/04/2018; patient is completing his doxycycline for the left foot although this continues to be a very difficult wound area with very adherent necrotic debris. We are using silver alginate to all his wounds right foot left foot and the small wounds on his buttock, silver collagen on the left heel. 09/11/2018; once again this patient has intense erythema and swelling of the left forefoot. Lesser degrees of erythema in the right foot. He has a long list of allergies and intolerances. I will reinstitute doxycycline. 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 Using silver alginate ooLeft heel also looks better using silver collagen ooUnfortunately both the areas on his feet look worse. The area on the left first second webspace is now gone through to the plantar part of his foot. The area on the left foot anteriorly is irritated with erythema and swelling in the forefoot. 09/25/2018 ooHis wound on the left plantar heel looks better. Using silver collagen ooThe area on the left buttock 2 small remnant areas. One is closed one is still open. Using silver alginate ooThe areas between both his first and second toes look worse. This in spite of long-standing antifungal therapy with ketoconazole and silver alginate which should have antifungal activity ooHe has small areas around his original wound on the left calf one is on the bottom of the original scar tissue and one superiorly both of these are small and superficial but again given wound history in this site this is worrisome 10/02/2018 ooLeft plantar heel continues to gradually contract using silver collagen ooLeft buttock wound is unchanged using silver alginate ooThe areas on his dorsal feet between his first and second toes  bilaterally look about the same. 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 Small open area here. We have been using silver alginate 10/09/2018; ooLeft plantar heel continues to look healthy and contract. No debridement is required ooLeft buttock slightly smaller with a tape injury wound just below which was new this week ooDorsal feet somewhat improved I have been using clindamycin ooLeft lateral looks lower extremity the actual open area looks worse although a lot of this is epithelialized. I am going to change to silver collagen today He has a lot more swelling in the right leg although this is not pitting not red and not particularly warm there is a lot of spasm in the right leg usually indicative of people with paralysis of some underlying discomfort. We have reviewed his vascular status from 2017 he had a left greater  saphenous vein ablation. I wonder about referring him back to vascular surgery if the area on the left leg continues to deteriorate. 10/16/2018 in today for follow-up and management of multiple lower extremity ulcers. His left Buttock wound is much lower smaller and almost closed completely. The wound to the left ankle has began to reopen with Epithelialization and some adherent slough. He has multiple new areas to the left foot and leg. The left dorsal foot without much improvement. Wound present between left great webspace and 2nd toe. Erythema and edema present right leg. Right LE ultrasound obtained on 10/10/18 was negative for DVT. 10/23/2018; ooLeft buttock is closed over. Still dry macerated skin but there is no open wound. I suspect this is chronic pressure/moisture ooLeft lateral calf is quite a bit worse than when I saw this last. There is clearly drainage here he has macerated  skin into the left plantar heel. We will change the primary dressing to alginate ooLeft dorsal foot has some improvement in overall wound area. Still using clindamycin and silver alginate ooRight dorsal foot about the same as the left using clindamycin and silver alginate ooThe erythema in the right leg has resolved. He is DVT rule out was negative ooLeft heel pressure area required debridement although the wound is smaller and the surface is health 10/26/2018 ooThe patient came back in for his nurse check today predominantly because of the drainage coming out of the left lateral leg with a recent reopening of his original wound on the left lateral calf. He comes in today with a large amount of surrounding erythema around the wound extending from the calf into the ankle and even in the area on the dorsal foot. He is not systemically unwell. He is not febrile. Nevertheless this looks like cellulitis. We have been using silver alginate to the area. I changed him to a regular visit and I am going to prescribe him doxycycline. The rationale here is a long list of medication intolerances and a history of MRSA. I did not [name] anything that I thought would provide a valuable culture 10/30/2018 ooFollow-up from his appointment 4 days ago with really an extensive area of cellulitis in the left calf left lateral ankle and left dorsal foot. I put him on doxycycline. He has a long list of medication allergies which are true allergy reactions. 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 In any case he is a lot better today. The erythema especially in the anterior and lateral left calf is better. He still has left ankle erythema. He also is complaining about increasing edema in the right leg we have only been using Kerlix Coban and he has been doing the wraps at home. Finally he has a spotty rash on the medial part of his upper left calf  which looks like folliculitis or perhaps wrap occlusion type injury. Small superficial macules not pustules 11/06/18 patient arrives today with again a considerable degree of erythema around the wound on the left lateral calf extending into the dorsal ankle and dorsal foot. This is a lot worse than when I saw this last week. He is on doxycycline really with not a lot of improvement. He has not been systemically unwell Wounds on the; left heel actually looks improved. Original area on the left foot and proximity to the first and second toes looks about the same. He has superficial areas on the dorsal foot, anterior calf and then the reopening  of his original wound on the left lateral calf which looks about the same ooThe only area he has on the right is the dorsal webspace first and second which is smaller. ooHe has a large area of dry erythematous skin on the left buttock small open area here. 11/13/2018; the patient arrives in much better condition. The erythema around the wound on the left lateral calf is a lot better. Not sure whether this was the clindamycin or the TCA and ketoconazole or just in the improvement in edema control [stasis dermatitis]. In any case this is a lot better. The area on the left heel is very small and just about resolved using silver collagen we have been using silver alginate to the areas on his dorsal feet 11/20/2018; his wounds include the left lateral calf, left heel, dorsal aspects of both feet just proximal to the first second webspace. He is stable to slightly improved. I did not think any changes to his dressings were going to be necessary 11/27/2018 he has a reopening on the left buttock which is surrounded by what looks like tinea or perhaps some other form of dermatitis. The area on the left dorsal foot has some erythema around it I have marked this area but I am not sure whether this is cellulitis or not. Left heel is not closed. Left calf the reopening is  really slightly longer and probably worse 1/13; in general things look better and smaller except for the left dorsal foot. Area on the left heel is just about closed, left buttock looks better only a small wound remains in the skin looks better [using Lotrisone] 1/20; the area on the left heel only has a few remaining open areas here. Left lateral calf about the same in terms of size, left dorsal foot slightly larger right lateral foot still not closed. The area on the left buttock has no open wound and the surrounding skin looks a lot better 1/27; the area on the left heel is closed. Left lateral calf better but still requiring extensive debridements. The area on his left buttock is closed. He still has the open areas on the left dorsal foot which is slightly smaller in the right foot which is slightly expanded. We have been using Iodoflex on these areas as well 2/3; left heel is closed. Left lateral calf still requiring debridement using Iodoflex there is no open area on his left buttock however he has dry scaly skin over a large area of this. Not really responding well to the Lotrisone. Finally the areas on his dorsal feet at the level of the first second webspace are slightly smaller on the right and about the same on the left. Both of these vigorously debrided with Anasept and gauze 2/10; left heel remains closed he has dry erythematous skin over the left buttock but there is no open wound here. Left lateral leg has come in and with. Still requiring debridement we have been using Iodoflex here. Finally the area on the left dorsal foot and right dorsal foot are really about the same extremely dry callused fissured areas. He does not yet have a dermatology appointment 2/17; left heel remains closed. He has a new open area on the left buttock. The area on the left lateral calf is bigger longer and still covered in necrotic debris. No major change in his foot areas bilaterally. I am awaiting for  a dermatologist to look on this. We have been using ketoconazole I do not know that this  is been doing any good at all. 2/24; left heel remains closed. The left buttock wound that was new reopening last week looks better. The left lateral calf appears better also although still requires debridement. The major area on his foot is the left first second also requiring debridement. We have been putting Prisma on all wounds. I do not believe that the ketoconazole has done too much good for his feet. He will use Lotrisone I am going to give him a 2-week course of terbinafine. We still do not have a dermatology appointment 3/2 left heel remains closed however there is skin over bone in this area I pointed this out to him today. The left buttock wound is epithelialized but still does not look completely stable. The area on the left leg required debridement were using silver collagen here. With regards to his feet we changed to Lotrisone last week and silver alginate. 3/9; left heel remains closed. Left buttock remains closed. The area on the right foot is essentially closed. The left foot remains unchanged. Slightly smaller on the left lateral calf. Using silver collagen to both of these areas 3/16-Left heel remains closed. Area on right foot is closed. Left lateral calf above the lateral malleolus open wound requiring debridement with easy bleeding. Left dorsal wound proximal to first toe also debrided. Left ischial area open new. Patient has been using Prisma with wrapping every 3 days. Dermatology appointment is apparently tomorrow.Patient has completed his terbinafine 2-week course with some apparent improvement according to him, there is still flaking and dry skin in his foot on the left 3/23; area on the right foot is reopened. The area on the left anterior foot is about the same still a very necrotic adherent surface. He still has the area on the left leg and reopening is on the left buttock. He  apparently saw dermatology although I do not have a note. According to the patient who is usually fairly well informed they did not have any good ideas. Put him on oral terbinafine which she is been on before. 3/30; using silver collagen to all wounds. Apparently his dermatologist put him on doxycycline and rifampin presumably some culture grew staph. I do not have this result. He remains on terbinafine although I have used terbinafine on him before 4/6; patient has had a fairly substantial reopening on the right foot between the first and second toes. He is finished his terbinafine and I believe is on doxycycline and rifampin still as prescribed by dermatology. We have been using silver collagen to all his wounds although the patient reports that he thinks silver alginate does better on the wounds on his buttock. 4/13; the area on his left lateral calf about the same size but it did not require debridement. ooLeft dorsal foot just proximal to the webspace between the first and second toes is about the same. Still nonviable surface. I note some superficial bronze discoloration of the dorsal part of his foot ooRight dorsal foot just proximal to the first and second toes also looks about the same. I still think there may be the same discoloration I noted above on the left ooLeft buttock wound looks about the same 4/20; left lateral calf appears to be gradually contracting using silver collagen. 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 The left dorsal foot wound is debrided of tightly adherent necrotic debris and really cleans up quite nicely. The right area is worse with expansion. I did not debride  this it is now over the base of the second toe ooThe area on his left buttock is smaller no debridement is required using silver collagen 5/4; left calf continues to make good progress. ooHe arrives with erythema around the wounds on his  dorsal foot which even extends to the plantar aspect. Very concerning for coexistent infection. He is finished the erythromycin I gave him for possible erythrasma this does not seem to have helped. ooThe area on the left foot is about the same base of the dorsal toes ooIs area on the buttock looks improved on the left 5/11; left calf and left buttock continued to make good progress. Left foot is about the same to slightly improved. ooMajor problem is on the right foot. He has not had an x-ray. Deep tissue culture I did last week showed both Enterobacter and E. coli. I did not change the doxycycline I put him on empirically although neither 1 of these were plated to doxycycline. He arrives today with the erythema looking worse on both the dorsal and plantar foot. Macerated skin on the bottom of the foot. he has not been systemically unwell 5/18-Patient returns at 1 week, left calf wound appears to be making some progress, left buttock wound appears slightly worse than last time, left foot wound looks slightly better, right foot redness is marginally better. X-ray of both feet show no air or evidence of osteomyelitis. Patient is finished his Omnicef and terbinafine. He continues to have macerated skin on the bottom of the left foot as well as right 5/26; left calf wound is better, left buttock wound appears to have multiple small superficial open areas with surrounding macerated skin. X-rays that I did last time showed no evidence of osteomyelitis in either foot. He is finished cefdinir and doxycycline. I do not think that he was on terbinafine. He continues to have a large superficial open area on the right foot anterior dorsal and slightly between the first and second toes. I did send him to dermatology 2 months ago or so wondering about whether they would do a fungal scraping. I do not believe they did but did do a culture. We have been using silver alginate to the toe areas, he has been using  antifungals at home topically either ketoconazole or Lotrisone. We are using silver collagen on the left foot, silver alginate on the right, silver collagen on the left lateral leg and silver alginate on the left buttock 6/1; left buttock area is healed. We have the left dorsal foot, left lateral leg and right dorsal foot. We are using silver alginate to the areas on both feet and silver collagen to the area on his left lateral calf 6/8; the left buttock apparently reopened late last week. He is not really sure how this happened. He is tolerating the terbinafine. Using silver alginate to all wounds 6/15; left buttock wound is larger than last week but still superficial. ooCame in the clinic today with a report of purulence from the left lateral leg I did not identify any infection ooBoth areas on his dorsal feet appear to be better. He is tolerating the terbinafine. Using silver alginate to all wounds 6/22; left buttock is about the same this week, left calf quite a bit better. His left foot is about the same however he comes in with erythema and warmth in the right forefoot once again. Culture that I gave him in the beginning of May showed Enterobacter and E. coli. I gave him doxycycline and  things seem to improve although neither 1 of these organisms was specifically plated. 6/29; left buttock is larger and dry this week. Left lateral calf looks to me to be improved. Left dorsal foot also somewhat improved right foot completely unchanged. The erythema on the right foot is still present. He is completing the Ceftin dinner that I gave him empirically [[name] discussion above.) 7/6 - All wounds look to be stable and perhaps improved, the left buttock wound is slightly smaller, per patient bleeds easily, completed ceftin, the right foot redness is less, he is on terbinafine 7/13; left buttock wound about the same perhaps slightly narrower. Area on the left lateral leg continues to narrow. Left  dorsal foot slightly smaller right foot about the same. We are using silver alginate on the right foot and Hydrofera Blue to the areas on the left. Unna boot on the left 2 layer compression on the right 7/20; left buttock wound absolutely the same. Area on lateral leg continues to get better. Left dorsal foot require debridement as did the right no major change in the 7/27; left buttock wound the same size necrotic debris over the surface. The area on the lateral leg is closed once again. His left foot looks better right foot about the same although there is some involvement now of the posterior first second toe area. He is still on terbinafine which I have given him for a month, not certain a centimeter major change 06/25/19-All wounds appear to be slightly improved according to report, left buttock wound looks clean, both foot wounds have minimal to no debris the right dorsal foot has minimal slough. We are using Hydrofera Blue to the left and silver alginate to the right foot and ischial wound. 8/10-Wounds all appear to be around the same, the right forefoot distal part has some redness which was not there before, however the wound looks clean and small. Ischial wound looks about the same with no changes 8/17; his wound on the left lateral calf which was his original chronic venous insufficiency wound remains closed. Since I last saw him the areas on the left dorsal foot right dorsal foot generally appear better but require debridement. The area on his left initial tuberosity appears somewhat larger to me perhaps hyper granulated and bleeds very easily. We have been using Hydrofera Blue to the left dorsal foot and silver alginate to everything else 8/24; left lateral calf remains closed. The areas on his dorsal feet on the webspace of the first and second toes bilaterally both look better. The area on the left buttock which is the pressure ulcer stage II slightly smaller. I change the dressing to  Hydrofera Blue to all areas 8/31; left lateral calf remains closed. The area on his dorsal feet bilaterally look better. Using Hydrofera Blue. Still requiring debridement on the left foot. No change in the left buttock pressure ulcers however 9/14; left lateral calf remains closed. Dorsal feet look quite a bit better than 2 weeks ago. Flaking dry skin also a lot better with the ammonium lactate I gave him 2 weeks ago. The area on the left buttock is improved. He states that his Roho cushion developed a leak and he is getting a new one, in the interim he is offloading this vigorously 9/21; left calf remains closed. Left heel which was a possible DTI looks better this week. He had macerated tissue around the left dorsal foot right foot looks satisfactory and improved left buttock wound. I changed his dressings to  his feet to silver alginate bilaterally. Continuing Hydrofera Blue on the left buttock. 9/28 left calf remains closed. Left heel did not develop anything [possible DTI] dry flaking skin on the left dorsal foot. Right foot looks satisfactory. Improved left buttock wound. We are using silver alginate on his feet Hydrofera Blue on the buttock. I have asked him to go back to the Lotrisone on his feet including the wounds and surrounding areas 10/5; left calf remains closed. The areas on the left and right feet about the same. A lot of this is epithelialized however debris over the remaining open areas. He is using Lotrisone and silver alginate. The area on the left buttock using Hydrofera Blue 10/26. Patient has been out for 3 weeks secondary to Covid concerns. He tested negative but I think his wife tested positive. He comes in today with the left foot substantially worse, right foot about the same. Even more concerning he states that the area on his left buttock closed over but then reopened and is considerably deeper in one aspect than it was before [stage III wound] 11/2; left foot really  about the same as last week. Quarter sized wound on the dorsal foot just proximal to the first second toes. Surrounding erythema with areas of denuded epithelium. This is not really much different looking. Did not look like cellulitis this time however. ooRight foot area about the same.. We have been using silver alginate alginate on his toes ooLeft buttock still substantial irritated skin around the wound which I think looks somewhat better. We have been using Hydrofera Blue here. 11/9; left foot larger than last week and a very necrotic surface. Right foot I think is about the same perhaps slightly smaller. Debris around the circumference also addressed. Unfortunately on the left buttock there is been a decline. Satellite lesions below the major wound distally and now a an additional one posteriorly we have been using Hydrofera Blue but I think this is a pressure issue 11/16; left foot ulcer dorsally again a very adherent necrotic surface. Right foot is about the same. Not much change in the pressure ulcer on his left buttock. 11/30; left foot ulcer dorsally basically the same as when I saw him 2 weeks ago. Very adherent fibrinous debris on the wound surface. Patient reports a lot of drainage as well. 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 We have been using Iodoflex, patient changed back to alginate because of the drainage. Area on his right dorsal foot really looks benign with a healthier surface certainly a lot better than on the left. Left buttock wounds all improved using Hydrofera Blue 12/7; left dorsal foot again no improvement. Tightly adherent debris. PCR culture I did last week only showed likely skin contaminant. I have gone ahead and done a punch biopsy of this which is about the last thing in terms of investigations I can think to do. He has known venous insufficiency and venous hypertension and this could be the issue here.  The area on the right foot is about the same left buttock slightly worse according to our intake nurse secondary to [facility] Blue sticking to the wound 12/14; biopsy of the left foot that I did last time showed changes that could be related to wound healing/chronic stasis dermatitis phenomenon no neoplasm. We have been using silver alginate to both feet. I change the one on the left today to Sorbact and silver alginate to his other 2 wounds 12/28; the  patient arrives with the following problems; ooMajor issue is the dorsal left foot which continues to be a larger deeper wound area. Still with a completely nonviable surface ooParadoxically the area mirror image on the right on the right dorsal foot appears to be getting better. ooHe had some loss of dry denuded skin from the lower part of his original wound on the left lateral calf. Some of this area looked a little vulnerable and for this reason we put him in wrap that on this side this week ooThe area on his left buttock is larger. He still has the erythematous circular area which I think is a combination of pressure, sweat. This does not look like cellulitis or fungal dermatitis 11/26/2019; -Dorsal left foot large open wound with depth. Still debris over the surface. Using Sorbact ooThe area on the dorsal right foot paradoxically has closed over [facility] has a reopening on the left ankle laterally at the base of his original wound that extended up into the calf. This appears clean. ooThe left buttock wound is smaller but with very adherent necrotic debris over the surface. We have been using silver alginate here as well The patient had arterial studies done in 2017. He had biphasic waveforms at the dorsalis pedis and posterior tibial bilaterally. ABI in the left was 1.17. Digit waveforms were dampened. He has slight spasticity in the great toes I do not think a TBI would be possible 1/11; the patient comes in today with a sizable reopening  between the first and second toes on the right. This is not exactly in the same location where we have been treating wounds previously. According to our intake nurse this was actually fairly deep but 0.6 cm. The area on the left dorsal foot looks about the same the surface is somewhat cleaner using Sorbact, his MRI is in 2 days. We have not managed yet to get arterial studies. The new reopening on the left lateral calf looks somewhat better using alginate. The left buttock wound is about the same using alginate 1/18; the patient had his ARTERIAL studies which were quite normal. ABI in the right at 1.13 with triphasic/biphasic waveforms on the left ABI 1.06 again with triphasic/biphasic waveforms. It would not have been possible to have done a toe brachial index because of spasticity. We have been using Sorbac to the left foot alginate to the rest of his wounds on the right foot left lateral calf and left buttock 1/25; arrives in clinic with erythema and swelling of the left forefoot worse over the first MTP area. This extends laterally dorsally and but also posteriorly. Still has an area on the left lateral part of the lower part of his calf wound it is eschared and clearly not closed. ooArea on the left buttock still with surrounding irritation and erythema. ooRight foot surface wound dorsally. The area between the right and first and second toes appears better. 2/1; ooThe left foot wound is about the same. Erythema slightly better I gave him a week of doxycycline empirically ooRight foot wound is more extensive extending between the toes to the plantar surface ooLeft lateral calf really no open surface on the inferior part of his original wound however the entire area still looks vulnerable ooAbsolutely no improvement in the left buttock wound required debridement. 2/8; the left foot is about the same. Erythema is slightly improved I gave him clindamycin last week. ooRight foot looks  better he is using Lotrimin and silver alginate ooHe has a breakdown in  the left lateral calf. Denuded epithelium which I have removed ooLeft buttock about the same were using Hydrofera Blue 2/15; left foot is about the same there is less surrounding erythema. Surface still has tightly adherent debris which I have debriding however not making any progress ooRight foot has a substantial wound on the medial right second toe between the first and second webspace. ooStill an open area on the left lateral calf distal area. ooButtock wound is about the same 2/22; left foot is about the same less surrounding erythema. Surface has adherent debris. Polymen Ag Right foot area significant wound between the first and second toes. We have been using silver alginate here Left lateral leg polymen Ag at the base of his original venous insufficiency wound ooLeft buttock some improvement here 3/1; ooRight foot is deteriorating in the first second toe webspace. Larger and more substantial. We have been using silver alginate. ooLeft dorsal foot about the same markedly adherent surface debris using PolyMem Ag ooLeft lateral calf surface debris using PolyMem AG ooLeft buttock is improved again using PolyMem Ag. ooHe is completing his terbinafine. The erythema in the foot seems better. He has been on this for 2 weeks 3/8; no improvement in any wound area in fact he has a small open area on the dorsal midfoot which is new this week. He has not gotten his foot x-rays yet 3/15; his x-rays were both negative for osteomyelitis of both feet. No major change in any of his wounds on the extremities however his buttock wounds are better. We have been using polymen on the buttocks, left lower leg. Iodoflex on the left foot and silver alginate on the right 3/22; arrives in clinic today with the 2 major issues are the improvement in the left dorsal foot wound which for once actually looks healthy with a nice  healthy wound surface without debridement. Using Iodoflex here. Unfortunately on the left lateral calf which is in the distal part of his original wound he came to the clinic here for there was purulent drainage noted some increased breakdown scattered around the original area and a small area proximally. We we are using polymen here will change to silver alginate today. His buttock wound on the left is better and I think the area on the right first second toe webspace is also improved 3/29; left dorsal foot looks better. Using Iodoflex. Left ankle culture from deterioration last time grew E. coli, Enterobacter and Enterococcus. I will give him a course of cefdinir although that will not cover Enterococcus. The area on the right foot in the webspace of the first and second toe lateral first toe looks better. The area on his buttock is about healed Vascular appointment is on April 21. This is to look at his venous system vis--vis continued breakdown of the wounds on the left including the left lateral leg and left dorsal foot he. He has had previous ablations on this side Objective Constitutional Sitting or standing Blood Pressure is within target range for patient.. Pulse regular and within target range for patient.Marland Kitchen Respirations regular, non-labored and within target range.. Temperature is normal and within the target range for the patient.Marland Kitchen Appears in no distress. Vitals Time Taken: 7:58 AM, Height: 70 in, Source: Stated, Weight: 216 lbs, Source: Stated, BMI: 31, Temperature: 97.8 F, Pulse: 100 bpm, Respiratory Rate: 18 breaths/min, Blood Pressure: 128/75 mmHg. Cardiovascular Needle pulses. General Notes: Wound exam; no mechanical debridement was required ooLeft lateral ankle silver alginate to the erythema from last week  seems a lot better. ooLeft dorsal foot Iodosorb ooRight foot silver alginate, left buttock polymen which is just above closed Integumentary (Hair, Skin) Wound #24  status is Open. Original cause of wound was Gradually Appeared. The wound is located on the Left,Dorsal Foot. The wound measures 2.3cm length x 2.4cm width x 0.3cm depth; 4.335cm^2 area and 1.301cm^3 volume. There is Fat Layer (Subcutaneous Tissue) Exposed exposed. There is no tunneling or undermining noted. There is a medium amount of serosanguineous drainage noted. The wound margin is distinct with the outline attached to the wound base. There is large (67-100%) red granulation within the wound bed. There is a small (1- 33%) amount of necrotic tissue within the wound bed including Adherent Slough. Wound #35 status is Open. Original cause of wound was Gradually Appeared. The wound is located on the Left Ischium. The wound measures 0.4cm length x 0.3cm width x 0.1cm depth; 0.094cm^2 area and 0.009cm^3 volume. There is Fat Layer (Subcutaneous Tissue) Exposed exposed. There is no tunneling or undermining noted. There is a small amount of serosanguineous drainage noted. The wound margin is flat and intact. There is large (67-100%) red granulation within the wound bed. There is no necrotic tissue within the wound bed. Wound #37 status is Open. Original cause of wound was Gradually Appeared. The wound is located on the Left,Lateral Malleolus. The wound measures 4cm length x 1.6cm width x 0.1cm depth; 5.027cm^2 area and 0.503cm^3 volume. There is Fat Layer (Subcutaneous Tissue) Exposed exposed. There is no tunneling or undermining noted. There is a medium amount of serosanguineous drainage noted. The wound margin is flat and intact. There is large (67-100%) red, pink, friable granulation within the wound bed. There is a small (1-33%) amount of necrotic tissue within the wound bed including Adherent Slough. Wound #38 status is Open. Original cause of wound was Gradually Appeared. The wound is located on the Right Toe - Web between 1st and 2nd. The wound measures 1cm length x 0.6cm width x 0.1cm depth;  0.471cm^2 area and 0.047cm^3 volume. There is Fat Layer (Subcutaneous Tissue) Exposed exposed. There is no tunneling or undermining noted. There is a small amount of serosanguineous drainage noted. The wound margin is flat and intact. There is medium (34-66%) red granulation within the wound bed. There is a medium (34-66%) amount of necrotic tissue within the wound bed including Adherent Slough. Wound #40 status is Open. Original cause of wound was Gradually Appeared. The wound is located on the Left,Lateral Lower Leg. The wound measures 0.7cm length x 0.7cm width x 0.1cm depth; 0.385cm^2 area and 0.038cm^3 volume. There is Fat Layer (Subcutaneous Tissue) Exposed exposed. There is no tunneling or undermining noted. There is a small amount of serosanguineous drainage noted. The wound margin is flat and intact. There is medium (34-66%) pink granulation within the wound bed. There is a medium (34-66%) amount of necrotic tissue within the wound bed including Adherent Slough. 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 There was a Four Layer Compression Therapy Procedure by Deon Pilling, RN. Post procedure Diagnosis Wound #37: Same as Pre-Procedure Plan Follow-up Appointments: Return Appointment  in 1 week. Dressing Change Frequency: Wound #24 Left,Dorsal Foot: Do not change entire dressing for one week. Wound #35 Left Ischium: Change Dressing every other day. Wound #37 Left,Lateral Malleolus: Do not change entire dressing for one week. Wound #38 Right Toe  - Web between 1st and 2nd: Change Dressing every other day. Wound #40 Left,Lateral Lower Leg: Do not change entire dressing for one week. Skin Barriers/Peri-Wound Care: Antifungal cream - on toes on both feet daily Barrier cream - to leg/ankle and left foot Moisturizing lotion - both legs Other: - Triamcinolone cream Primary Wound Dressing: Wound #24 Left,Dorsal Foot: Iodoflex Wound #35 Left Ischium: Polymem Silver Wound #37 Left,Lateral Malleolus: Calcium Alginate with Silver Wound #38 Right Toe - Web between 1st and 2nd: Calcium Alginate with Silver Wound #40 Left,Lateral Lower Leg: Calcium Alginate with Silver Secondary Dressing: Wound #24 Left,Dorsal Foot: Dry Gauze Wound #35 Left Ischium: Foam Border - or ABD pad and tape Wound #37 Left,Lateral Malleolus: Dry Gauze ABD pad Wound #38 Right Toe - Web between 1st and 2nd: Kerlix/Rolled Gauze - secure with tape Dry Gauze Wound #40 Left,Lateral Lower Leg: Dry Gauze ABD pad Edema Control: 4 layer compression: Left lower extremity Elevate legs to the level of the heart or above for 30 minutes daily and/or when sitting, a frequency of: - throughout the day Support Garment 30-40 mm/Hg pressure to: - Juxtalite to right leg Off-Loading: Low air-loss mattress (Group 2) Roho cushion for wheelchair Turn and reposition every 2 hours - out of wheelchair throughout the day, try to lay on sides, sleep in the bed not the recliner The following medication(s) was prescribed: cefdinir oral 300 mg capsule 1 capsule oral bid for 7 days for wound infection starting 02/18/2020 1. Continue with silver alginate on the left lateral malleolus and on the right foot 2. Continue with Iodoflex to the left dorsal foot which is really gotten quite good 3. Polymen to the areas of the buttock although this may be closed by next time 4. Apligraf through his insurance 5. Vascular appointment in mid to late April Electronic Signature(s) Signed:  02/18/2020 5:31:36 PM By: Linton Ham MD Entered By: Linton Ham on 02/18/2020 09:43:51 -------------------------------------------------------------------------------- SuperBill Details Patient Name: Date of Service: [name] 02/18/2020 Medical Record [id number] Patient Account Number: [id number] Date of Birth/Sex: Treating RN: [birth year] (34 y.o. [name]: [place], [place] Other Clinician: Referring Provider: Treating Provider/Extender:Adell Panek, [name], [name] [name]: 215 Diagnosis Coding ICD-10 Codes Code Description L97.521 Non-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 L97.511 Non-pressure chronic ulcer of other part of right foot limited to breakdown of skin 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, complete 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, stage 3 L97.321 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 to breakdown of skin L03.116 Cellulitis of left lower limb Facility Procedures CPT4 Code Description: [id number] (Facility Use Only) [id number] - APPLY MULTLAY COMPRS LWR LT LEG Modifier: Quantity: 1 Physician Procedures CPT4 Code Description: [id number] - WC PHYS LEVEL 3 - EST PT ICD-10 Diagnosis Description L97.521 Non-pressure chronic ulcer of other part of left foot li L97.511 Non-pressure chronic ulcer of other part of right foot l L97.321 Non-pressure chronic  ulcer of left ankle limited to brea L03.116 Cellulitis of left lower limb Modifier: mited to breakdo imited to breakd kdown of skin Quantity: 1 wn of skin own of skin Electronic Signature(s) Signed: 02/18/2020 5:31:36 PM By: Linton Ham MD Entered By: Linton Ham on 02/18/2020 09:44:24

## 2020-02-25 ENCOUNTER — Encounter (HOSPITAL_BASED_OUTPATIENT_CLINIC_OR_DEPARTMENT_OTHER): Payer: BC Managed Care – PPO | Attending: Internal Medicine | Admitting: Internal Medicine

## 2020-02-25 ENCOUNTER — Other Ambulatory Visit: Payer: Self-pay

## 2020-02-25 DIAGNOSIS — L89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, stage 3: Secondary | ICD-10-CM | POA: Diagnosis not present

## 2020-02-25 DIAGNOSIS — L97521 Non-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: Secondary | ICD-10-CM | POA: Insufficient documentation

## 2020-02-25 DIAGNOSIS — L03116 Cellulitis of left lower limb: Secondary | ICD-10-CM | POA: Insufficient documentation

## 2020-02-25 DIAGNOSIS — I87332 Chronic venous hypertension (idiopathic) with ulcer and inflammation of left lower extremity: Secondary | ICD-10-CM | POA: Diagnosis not present

## 2020-02-25 DIAGNOSIS — Z87891 Personal history of nicotine dependence: Secondary | ICD-10-CM | POA: Diagnosis not present

## 2020-02-25 DIAGNOSIS — L97321 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 to breakdown of skin: Secondary | ICD-10-CM | POA: Diagnosis not present

## 2020-02-25 DIAGNOSIS — I87312 Chronic venous hypertension (idiopathic) with ulcer of left lower extremity: Secondary | ICD-10-CM | POA: Diagnosis not present

## 2020-02-25 DIAGNOSIS — G8221 Paraplegia, complete: Secondary | ICD-10-CM | POA: Insufficient documentation

## 2020-02-25 DIAGNOSIS — Z89422 Acquired absence of other left toe(s): Secondary | ICD-10-CM | POA: Insufficient documentation

## 2020-02-25 DIAGNOSIS — L97821 Non-pressure chronic ulcer of other part of left lower leg limited to breakdown of skin: Secondary | ICD-10-CM | POA: Diagnosis not present

## 2020-02-25 DIAGNOSIS — L97511 Non-pressure chronic ulcer of other part of right foot limited to breakdown of skin: Secondary | ICD-10-CM | POA: Diagnosis not present

## 2020-02-25 NOTE — Progress Notes (Signed)
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 aggressively for this although we have not made any improvement here. 08/11/17; left lateral leg. Surface continues to improve with the Iodoflex although we are not seeing much improvement in overall wound dimensions. Areas on his plantar left foot and right foot show no improvement. In fact the right foot looks somewhat worse 08/18/17; left lateral leg. We changed to [facility] Blue last week after a prolonged course of Iodoflex which helps get the surface better. It appears that the wound with is improved. Continue with difficult areas on the left dorsal first second and plantar first second on the right 09/01/17; patient arrives in clinic today  having had a temperature of 103 yesterday. He was seen in the ER and [facility]. The patient was concerned he could have cellulitis again in the right leg however they diagnosed him with a UTI and he is now on Keflex. He has a history of cellulitis which is been recurrent and difficult but this is been in the left leg, in the past 5 use doxycycline. He does in and out catheterizations at home which are risk factors for UTI 09/08/17; patient will be completing his Keflex this weekend. The erythema on the left leg is  considerably better. He has a new wound today on the medial part of the right leg small superficial almost looks like a skin tear. He has worsening of the area on the right dorsal first and second toe. His major area on the left lateral leg is better. Using Hydrofera Blue on all areas 09/15/17; gradual reduction in width on the long wound in the left lateral leg. No debridement required. He also has wounds on the plantar aspect of his left first second toe web space and on the dorsal aspect of the right first second toe web space. 09/22/17; there continues to be very gradual improvements in the dimensions of the left lateral leg wound. He hasn't round erythematous spot with might be pressure on his wheelchair. There is no evidence obviously of infection no purulence no warmth He has a dry scaled area on the plantar aspect of the left first second toe Improved area on the dorsal right first second toe. 09/29/17; left lateral leg wound continues to improve in dimensions mostly with an is still a fairly long but increasingly narrow wound. He has a dry scaled area on the plantar aspect of his left first second toe web space Increasingly concerning area on the dorsal right first second toe. In fact I am concerned today about possible cellulitis around this wound. The areas extending up his second toe and although there is deformities here almost appears to abut on the nailbed. 10/06/17; left lateral leg wound continues to make very gradual progress. Tissue culture I did from the right first second toe dorsal foot last time grew MRSA and enterococcus which was vancomycin sensitive. This was not sensitive to clindamycin or doxycycline. He is allergic to Zyvox and sulfa we have therefore arrange for him to have dalvance infusion tomorrow. He is had this in the past and tolerated it well 10/20/17; left lateral leg wound continues to make decent progress. This is certainly reduced in terms of with  there is advancing epithelialization.The cellulitis in the right foot looks better although he still has a deep wound in the dorsal aspect of the first second toe web space. Plantar left first toe web space on the left I think is making some progress 10/27/17; left lateral leg wound continues to make decent progress. Advancing epithelialization.using Hydrofera Blue The right first second toe web space wound is better-looking using silver alginate Improvement in the left plantar first second toe web space. Again using silver alginate 11/03/17 left lateral leg wound continues to make decent progress albeit slowly. Using [facility] The right per second toe web space continues to be a very problematic looking punched out wound. I obtained a piece of tissue for deep culture I did extensively treated this for fungus. It is difficult to imagine that this is a pressure area as the patient states other than going outside he doesn't really wear shoes at home The left plantar first second toe web  space looked fairly senescent. Necrotic edges. This required debridement change to [facility] Blue to all wound areas 11/10/17; left lateral leg wound continues to contract. Using Hydrofera Blue On the right dorsal first second toe web space dorsally. Culture I did of this area last week grew MRSA there is not an easy oral option in this patient was multiple antibiotic allergies or intolerances. This was only a rare culture isolate I'm therefore going to use Bactroban under silver alginate On the left plantar first second toe web space. Debridement is required here. This is also unchanged 11/17/17; left lateral leg wound continues to contract using Hydrofera Blue this is no longer the major issue. The major concern here is the right first second toe web space. He now has an open area going from dorsally to the plantar aspect. There is now wound on the inner lateral part of the first toe. Not a very viable surface  on this. There is erythema spreading medially into the forefoot. No major change in the left first second toe plantar wound 11/24/17; left lateral leg wound continues to contract using Hydrofera Blue. Nice improvement today The right first second toe web space all of this looks a lot less angry than last week. I have given him clindamycin and topical Bactroban for MRSA and terbinafine for the possibility of underlining tinea pedis that I could not control with ketoconazole. Looks somewhat better The area on the plantar left first second toe web space is weeping with dried debris around the wound 12/01/17; left lateral leg wound continues to contract he Hydrofera Blue. It is becoming thinner in terms of with nevertheless it is making good improvement. The right first second toe web space looks less angry but still a large necrotic-looking wounds starting on the plantar aspect of the right foot extending between the toes and now extensively on the base of the right second toe. I gave him clindamycin and topical Bactroban for MRSA anterior benefiting for the possibility of underlying tinea pedis. Not looking better today The area on the left first/second toe looks better. Debrided of necrotic debris 12/05/17* the patient was worked in urgently today because over the weekend he found blood on his incontinence bad when he woke up. He was found to have an ulcer by his wife who does most of his wound care. He came in today for [place] to look at this. He has not had a history of wounds in his buttocks in spite of his paraplegia. 12/08/17; seen in follow-up today at his usual appointment. He was seen earlier this week and found to have a new wound on his buttock. We also follow him for wounds on the left lateral leg, left first second toe web space and right first second toe web space 12/15/17; we have been using Hydrofera Blue to the left lateral leg which has improved. The right first second toe web space has  also improved. Left first second toe web space plantar aspect looks stable. The left buttock has worsened using Santyl. Apparently the buttock has drainage 12/22/17; we have been using Hydrofera Blue to the left lateral leg which continues to improve now 2 small wounds separated by normal skin. He tells [place] he had a fever up to 100 yesterday he is prone to UTIs but has not noted anything different. He does in and out catheterizations. The area between the first and second toes today does not look good necrotic surface covered with what looks to be purulent drainage and erythema  extending into the third toe. I had gotten this to something that I thought look better last time however it is not look good today. He also has a necrotic surface over the buttock wound which is expanded. I thought there might be infection under here so I removed a lot of the surface with a #5 curet though nothing look like it really needed culturing. He is been using Santyl to this area 12/27/17; his original wound on the left lateral leg continues to improve using Hydrofera Blue. I gave him samples of Baxdella although he was unable to take them out of fear for an allergic reaction ["lump in his throat"].the culture I did of the purulent drainage from his second toe last week showed both enterococcus and a set Enterobacter I was also concerned about the erythema on the bottom of his foot although paradoxically although this looks somewhat better today. 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 We've been using silver alginate here. They came up today that he sleeps in a recliner, I'm not sure why but I asked him to stop this 01/03/18; his original wound we've been using Hydrofera Blue is now separated into 2 areas. Ulcer on his left buttock is better he is off the recliner and sleeping in bed Finally both wound areas between his  first and second toes also looks some better 01/10/18; his original wound on the left lateral leg is now separated into 2 wounds we've been using Hydrofera Blue Ulcer on his left buttock has some drainage. There is a small probing site going into muscle layer superiorly.using silver alginate -He arrives today with a deep tissue injury on the left heel The wound on the dorsal aspect of his first second toe on the left looks a lot betterusing silver alginate ketoconazole The area on the first second toe web space on the right also looks a lot bette 01/17/18; his original wound on the left lateral leg continues to progress using Hydrofera Blue Ulcer on his left buttock also is smaller surface healthier except for a small probing site going into the muscle layer superiorly. 2.4 cm of tunneling in this area DTI on his left heel we have only been offloading. Looks better than last week no threatened open no evidence of infection the wound on the dorsal aspect of the first second toe on the left continues to look like it's regressing we have only been using silver alginate and terbinafine orally The area in the first second toe web space on the right also looks to be a lot better using silver alginate and terbinafine I think this was prompted by tinea pedis 01/31/18; the patient was hospitalized in [place] last week apparently for a complicated UTI. He was discharged on cefepime he does in and out catheterizations. In the hospital he was discovered M I don't mild elevation of AST and ALTs and the terbinafine was stopped.predictably the pressure ulcer on his buttock looks betterusing silver alginate. The area on the left lateral leg also is better using Hydrofera Blue. The area between the first and second toes on the left better. First and second toes on the right still substantial but better. Finally the DTI on the left heel has held together and looks like it's resolving 02/07/18-he is here in  follow-up evaluation for multiple ulcerations. He has new injury to the lateral aspect of the last issue a pressure ulcer, he  states this is from adhesive removal trauma. He states he has tried multiple adhesive products with no success. All other ulcers appear stable. The left heel DTI is resolving. We will continue with same treatment plan and follow-up next week. 02/14/18; follow-up for multiple areas. He has a new area last week on the lateral aspect of his pressure ulcer more over the posterior trochanter. The original pressure ulcer looks quite stable has healthy granulation. 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 Almost fully epithelialized on the original superior area using Hydrofera Blue DTI on the left heel has peeled off this week to reveal a small superficial wound under denuded skin and subcutaneous tissue Both areas between the first and second toes look better including nothing open on the left 02/21/18; The patient's wounds on his left ischial tuberosity and posterior left greater trochanter actually looked better. He has a large area of irritation around the area which I think is contact dermatitis. I am doubtful that this is fungal His original wound on the left lateral calf continues to improve we have been using Hydrofera Blue There is no open area in the left first second toe web space although there is a lot of thick callus The DTI on the left heel required debridement today of necrotic surface eschar and subcutaneous tissue using silver alginate Finally the area on the right first second toe webspace continues to contract using silver alginate and ketoconazole 02/28/18 Left ischial tuberosity wounds look better using silver alginate. Original wound on the left calf only has one small open area left using Hydrofera Blue DTI on the left heel required debridement mostly  removing skin from around this wound surface. Using silver alginate The areas on the right first/second toe web space using silver alginate and ketoconazole 03/08/18 on evaluation today patient appears to be doing decently well as best I can tell in regard to his wounds. This is the first time that I have seen him as he generally is followed by Dr. Dellia Nims. With that being said none of his wounds appear to be infected he does have an area where there is some skin covering what appears to be a new wound on the left dorsal surface of his great toe. This is right at the nail bed. With that being said I do believe that debrided away some of the excess skin can be of benefit in this regard. Otherwise he has been tolerating the dressing changes without complication. 03/14/18; patient arrives today with the multiplicity of wounds that we are following. He has not been systemically unwell Original wound on the left lateral calf now only has 2 small open areas we've been using Hydrofera Blue which should continue The deep tissue injury on the left heel requires debridement today. We've been using silver alginate The left first second toe and the right first second toe are both are reminiscence what I think was tinea pedis. Apparently some of the callus Surface between the toes was removed last week when it started draining. Purulent drainage coming from the wound on the ischial tuberosity on the left. 03/21/18-He is here in follow-up evaluation for multiple wounds. 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 He tolerated debridement. The only change to last week's recommendations is to discontinue antifungal cream between toes. He will follow-up next week 03/28/18; following up for multiple wounds;Concern this  week is streaking redness and swelling in the right foot. He is going to need antibiotics for this. 03/31/18; follow-up for  right foot cellulitis. Streaking redness and swelling in the right foot on 03/28/18. 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 I put him on clindamycin 300 mg every 6 and brought him in for a quick check. He has an open wound between his first and second toes on the right foot as a potential source. 04/04/18; Right foot cellulitis is resolving he is completing clindamycin. This is truly good news Left lateral calf wound which is initial wound only has one small open area inferiorly this is close to healing out. He has compression stockings. We will use Hydrofera Blue right down to the epithelialization of this Nonviable surface on the left heel which was initially pressure with a DTI. We've been using Hydrofera Blue. I'm going to switch this back to silver alginate Left first second toe/tinea pedis this looks better using silver alginate Right first second toe tinea pedis using silver alginate Large pressure ulcers on theLeft ischial tuberosity. Small wound here Looks better. I am uncertain about the surface over the large wound. Using silver alginate 04/11/18; Cellulitis in the right foot is resolved Left lateral calf wound which was his original wounds still has 2 tiny open areas remaining this is just about closed Nonviable surface on the left heel is better but still requires debridement Left first second toe/tinea pedis still open using silver alginate Right first second toe wound tinea pedis I asked him to go back to using ketoconazole and silver alginate Large pressure ulcers on the left ischial tuberosity this shear injury here is resolved. Wound is smaller. No evidence of infection using silver alginate 04/18/18; Patient arrives with an intense area of cellulitis in the right mid lower calf extending into the right heel area. Bright red and warm. Smaller area on the left anterior leg. He has a significant history of MRSA. He will definitely  need antibioticsdoxycycline He now has 2 open areas on the left ischial tuberosity the original large wound and now a satellite area which I think was above his initial satellite areas. Not a wonderful surface on this satellite area surrounding erythema which looks like pressure related. His left lateral calf wound again his original wound is just about closed Left heel pressure injury still requiring debridement Left first second toe looks a lot better using silver alginate Right first second toe also using silver alginate and ketoconazole cream also looks better 04/20/18; the patient was worked in early today out of concerns with his cellulitis on the right leg. I had started him on doxycycline. This was 2 days ago. His wife was concerned about the swelling in the area. Also concerned about the left buttock. He has not been systemically unwell no fever chills. No nausea vomiting or diarrhea 04/25/18; the patient's left buttock wound is continued to deteriorate he is using Hydrofera Blue. He is still completing clindamycin for the cellulitis on the right leg although all of this looks better. 05/02/18 Left buttock wound still with a lot of drainage and a very tightly adherent fibrinous necrotic surface. He has a deeper area superiorly The left lateral calf wound is still closed DTI wound on the left heel necrotic surface especially the circumference using Iodoflex Areas between his left first second toe and right first second toe both look better. Dorsally and the right first second toe he had a necrotic surface although at smaller.  In using silver alginate and ketoconazole. I did a culture last week which was a deep tissue culture of the reminiscence of the open wound on the right first second toe dorsally. This grew a few Acinetobacter and a few methicillin-resistant staph aureus. Nevertheless the area actually this week looked better. I didn't feel the need to specifically address this at  least in terms of systemic antibiotics. 05/09/18; wounds are measuring larger more drainage per our intake. We are using Santyl covered with alginate on the large superficial buttock wounds, Iodosorb on the left heel, ketoconazole and silver alginate to the dorsal first and second toes bilaterally. 05/16/18; The area on his left buttock better in some aspects although the area superiorly over the ischial tuberosity required an extensive debridement.using Santyl Left heel appears stable. Using Iodoflex The areas between his first and second toes are not bad however there is spreading erythema up the dorsal aspect of his left foot this looks like cellulitis again. He is insensate the erythema is really very brilliant.o Erysipelas He went to [name] [facility] allergist days ago because he was itching part of this he had lab work done. This showed a white count of 15.1 with 70% neutrophils. Hemoglobin of 11.4 and a platelet count of 659,000. Last white count we had in Epic was a 2-1/2 years ago which was 25.9 but he was ill at the time. He was able to show me some lab work that was done by his primary physician the pattern is about the same. 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 But prompted me to go ahead and do x-rays of both feet and the pelvis rule out osteomyelitis. He also had a comprehensive metabolic panel this was reasonably normal his albumin was 3.7 liver function tests BUN/creatinine all normal 05/23/18; x-rays of both his feet from last week were negative for underlying pulmonary abnormality. The x-ray of his pelvis however showed mild irregularity in the left ischial which may represent some early osteomyelitis. The wound in the left ischial continues to get deeper clearly now exposed muscle. Each week necrotic surface material over this area. Whereas the rest of the wounds do not look so bad. The left ischial wound we have been using Santyl and  calcium alginate To the left heel surface necrotic debris using Iodoflex The left lateral leg is still healed Areas on the left dorsal foot and the right dorsal foot are about the same. There is some inflammation on the left which might represent contact dermatitis, fungal dermatitis I am doubtful cellulitis although this looks better than last week 05/30/18; CT scan done at Hospital did not show any osteomyelitis or abscess. Suggested the possibility of underlying cellulitis although I don't [name] a lot of evidence of this at the bedside The wound itself on the left buttock/upper thigh actually looks somewhat better. No debridement Left heel also looks better no debridement continue Iodoflex Both dorsal first second toe spaces appear better using Lotrisone. Left still required debridement 06/06/18; Intake reported some purulent looking drainage from the left gluteal wound. Using Santyl and calcium alginate Left heel looks better although still a nonviable surface requiring debridement The left dorsal foot first/second webspace actually expanding and somewhat deeper. I may consider doing a shave biopsy of this area Right dorsal foot first/second webspace appears stable to improved. Using Lotrisone and silver alginate to both these areas 06/13/18 Left gluteal surface looks better. Now separated in the 2 wounds. No  debridement required. Still drainage. We'll continue silver alginate Left heel continues to look better with Iodoflex continue this for at least another week Of his dorsal foot wounds the area on the left still has some depth although it looks better than last week. We've been using Lotrisone and silver alginate 06/20/18 Left gluteal continues to look better healthy tissue Left heel continues to look better healthy granulation wound is smaller. He is using Iodoflex and his long as this continues continue the Iodoflex Dorsal right foot looks better unfortunately dorsal left foot does  not. There is swelling and erythema of his forefoot. He had minor trauma to this several days ago but doesn't think this was enough to have caused any tissue injury. Foot looks like cellulitis, we have had this problem before 06/27/18 on evaluation today patient appears to be doing a little worse in regard to his foot ulcer. 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 Both of which would really be his only options for treating this infection. In the past he has been given and effusion of Orbactiv. This is done very well for him in the past again it's one time dosing IV antibiotic therapy. Subsequently I do believe this is something we're gonna need to [name] about doing at this point in time. Currently his other wounds seem to be doing somewhat better in my pinion I'm pretty happy in that regard. 07/03/18 on evaluation today patient's wounds actually appear to be doing fairly well. He has been tolerating the dressing changes without complication. All in all he seems to be showing signs of improvement. In regard to the antibiotics he has been dealing with infectious disease since I saw him last week as far as getting this scheduled. In the end [facility]'s [facility] this coming Friday. In the meantime he has been continuing to perform the dressing changes in such as previous. There does not appear to be any evidence of infection worsengin at this time. 07/10/18; Since I last saw this man 2 weeks ago things have actually improved. IV antibiotics of resulted in less forefoot erythema although there is still some present. He is not systemically unwell Left buttock wounds 2 now have no depth there is increased epithelialization Using silver alginate Left heel still requires debridement using Iodoflex Left dorsal foot  still with a sizable wound about the size of a border but healthy granulation Right dorsal foot still with a slitlike area using silver alginate 07/18/18; the patient's cellulitis in the left foot is improved in fact I think it is on its way to resolving. Left buttock wounds 2 both look better although the larger one has hypertension granulation we've been using silver alginate Left heel has some thick circumferential redundant skin over the wound edge which will need to be removed today we've been using Iodoflex Left dorsal foot is still a sizable wound required debridement using silver alginate The right dorsal foot is just about closed only a small open area remains here 07/25/18; left foot cellulitis is resolved Left buttock wounds 2 both look better. Hyper-granulation on the major area Left heel as some debris over the surface but otherwise looks a healthier wound. Using silver collagen Right dorsal foot is just about closed 07/31/18; arrives with our intake nurse worried about purulent drainage from  the buttock. We had hyper-granulation here last week His buttock wounds 2 continue to look better Left heel some debris over the surface but measuring smaller. Right dorsal foot unfortunately has openings between the toes Left foot superficial wound looks less aggravated. 08/07/18 Buttock wounds continue to look better although some of her granulation and the larger medial wound. silver alginate Left heel continues to look a lot better.silver collagen Left foot superficial wound looks less stable. Requires debridement. He has a new wound superficial area on the foot on the lateral dorsal foot. Right foot looks better using silver alginate without Lotrisone 08/14/2018; patient was in the ER last week diagnosed with a UTI. He is now on Cefpodoxime and Macrodantin. Buttock wounds continued to be smaller. Using silver alginate Left heel continues to look better using silver collagen Left foot  superficial wound looks as though it is improving Right dorsal foot area is just about healed. 08/21/2018; patient is completed his antibiotics for his UTI. He has 2 open areas on the buttocks. There is still not closed although the surface looks satisfactory. Using silver alginate Left heel continues to improve using silver collagen The bilateral dorsal foot areas which are at the base of his first and second toes/possible tinea pedis are actually stable on the left but worse on the right. The area on the left required debridement of necrotic surface. After debridement I obtained a specimen for PCR culture. The right dorsal foot which is been just about healed last week is now reopened 08/28/2018; culture done on the left dorsal foot showed coag negative staph both staph epidermidis and Lugdunensis. I think this is worthwhile initiating systemic treatment. I will use doxycycline given his long list of allergies. The area on the left heel slightly improved but still requiring debridement. The large wound on the buttock is just about closed whereas the smaller one is larger. Using silver alginate in this area 09/04/2018; patient is completing his doxycycline for the left foot although this continues to be a very difficult wound area with very adherent necrotic debris. We are using silver alginate to all his wounds right foot left foot and the small wounds on his buttock, silver collagen on the left heel. 09/11/2018; once again this patient has intense erythema and swelling of the left forefoot. Lesser degrees of erythema in the right foot. He has a long list of allergies and intolerances. I will reinstitute doxycycline. 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 Using silver alginate Left heel also looks better using silver collagen Unfortunately both the areas on his feet look worse. The area on the left first second webspace is now gone through to  the plantar part of his foot. The area on the left foot anteriorly is irritated with erythema and swelling in the forefoot. 09/25/2018 His wound on the left plantar heel looks better. Using silver collagen The area on the left buttock 2 small remnant areas. One is closed one is still open. Using silver alginate The areas between both his first and second toes look worse. This in spite of long-standing antifungal therapy with ketoconazole and silver alginate which should have antifungal activity He has small areas around his original wound on the left calf one is on the bottom of the original scar tissue and one superiorly both of these are small and superficial but again given wound history in this site this is worrisome 10/02/2018 Left plantar heel continues to gradually  contract using silver collagen Left buttock wound is unchanged using silver alginate The areas on his dorsal feet between his first and second toes bilaterally look about the same. I prescribed clindamycin ointment to [name] if we can address chronic staph colonization and also the underlying possibility of erythrasma The left lateral lower extremity wound is actually on the lateral part of his ankle. Small open area here. We have been using silver alginate 10/09/2018; Left plantar heel continues to look healthy and contract. No debridement is required Left buttock slightly smaller with a tape injury wound just below which was new this week Dorsal feet somewhat improved I have been using clindamycin Left lateral looks lower extremity the actual open area looks worse although a lot of this is epithelialized. I am going to change to silver collagen today He has a lot more swelling in the right leg although this is not pitting not red and not particularly warm there is a lot of spasm in the right leg usually indicative of people with paralysis of some underlying discomfort. We have reviewed his vascular status from 2017 he had a  left greater saphenous vein ablation. I wonder about referring him back to vascular surgery if the area on the left leg continues to deteriorate. 10/16/2018 in today for follow-up and management of multiple lower extremity ulcers. His left Buttock wound is much lower smaller and almost closed completely. The wound to the left ankle has began to reopen with Epithelialization and some adherent slough. He has multiple new areas to the left foot and leg. The left dorsal foot without much improvement. Wound present between left great webspace and 2nd toe. Erythema and edema present right leg. Right LE ultrasound obtained on 10/10/18 was negative for DVT. 10/23/2018; Left buttock is closed over. Still dry macerated skin but there is no open wound. I suspect this is chronic pressure/moisture Left lateral calf is quite a bit worse than when I saw this last. There is clearly drainage here he has macerated skin into the left plantar heel. We will change the primary dressing to alginate Left dorsal foot has some improvement in overall wound area. Still using clindamycin and silver alginate Right dorsal foot about the same as the left using clindamycin and silver alginate The erythema in the right leg has resolved. He is DVT rule out was negative Left heel pressure area required debridement although the wound is smaller and the surface is health 10/26/2018 The patient came back in for his nurse check today predominantly because of the drainage coming out of the left lateral leg with a recent reopening of his original wound on the left lateral calf. He comes in today with a large amount of surrounding erythema around the wound extending from the calf into the ankle and even in the area on the dorsal foot. He is not systemically unwell. He is not febrile. Nevertheless this looks like cellulitis. We have been using silver alginate to the area. I changed him to a regular visit and I am going to prescribe him  doxycycline. The rationale here is a long list of medication intolerances and a history of MRSA. I did not [name] anything that I thought would provide a valuable culture 10/30/2018 Follow-up from his appointment 4 days ago with really an extensive area of cellulitis in the left calf left lateral ankle and left dorsal foot. I put him on doxycycline. He has a long list of medication allergies which are true allergy reactions. 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 In any case he is a lot better today. The erythema especially in the anterior and lateral left calf is better. He still has left ankle erythema. He also is complaining about increasing edema in the right leg we have only been using Kerlix Coban and he has been doing the wraps at home. Finally he has a spotty rash on the medial part of his upper left calf which looks like folliculitis or perhaps wrap occlusion type injury. Small superficial macules not pustules 11/06/18 patient arrives today with again a considerable degree of erythema around the wound on the left lateral calf extending into the dorsal ankle and dorsal foot. This is a lot worse than when I saw this last week. He is on doxycycline really with not a lot of improvement. He has not been systemically unwell Wounds on the; left heel actually looks improved. Original area on the left foot and proximity to the first and second toes looks about the same. He has superficial areas on the dorsal foot, anterior calf and then the reopening of his original wound on the left lateral calf which looks about the same The only area he has on the right is the dorsal webspace first and second which is smaller. He has a large area of dry erythematous skin on the left buttock small open area here. 11/13/2018; the patient arrives in much better condition. The erythema around the wound on the left lateral calf is a lot better.  Not sure whether this was the clindamycin or the TCA and ketoconazole or just in the improvement in edema control [stasis dermatitis]. In any case this is a lot better. The area on the left heel is very small and just about resolved using silver collagen we have been using silver alginate to the areas on his dorsal feet 11/20/2018; his wounds include the left lateral calf, left heel, dorsal aspects of both feet just proximal to the first second webspace. He is stable to slightly improved. I did not think any changes to his dressings were going to be necessary 11/27/2018 he has a reopening on the left buttock which is surrounded by what looks like tinea or perhaps some other form of dermatitis. The area on the left dorsal foot has some erythema around it I have marked this area but I am not sure whether this is cellulitis or not. Left heel is not closed. Left calf the reopening is really slightly longer and probably worse 1/13; in general things look better and smaller except for the left dorsal foot. Area on the left heel is just about closed, left buttock looks better only a small wound remains in the skin looks better [using Lotrisone] 1/20; the area on the left heel only has a few remaining open areas here. Left lateral calf about the same in terms of size, left dorsal foot slightly larger right lateral foot still not closed. The area on the left buttock has no open wound and the surrounding skin looks a lot better 1/27; the area on the left heel is closed. Left lateral calf better but still requiring extensive debridements. The area on his left buttock is closed. He still has the open areas on the left dorsal foot which is slightly smaller in the right foot which is slightly expanded. We have been using Iodoflex on these areas as well 2/3; left heel is closed. Left lateral calf still requiring debridement using Iodoflex  there is no open area on his left buttock however he has dry scaly skin over  a large area of this. Not really responding well to the Lotrisone. Finally the areas on his dorsal feet at the level of the first second webspace are slightly smaller on the right and about the same on the left. Both of these vigorously debrided with Anasept and gauze 2/10; left heel remains closed he has dry erythematous skin over the left buttock but there is no open wound here. Left lateral leg has come in and with. Still requiring debridement we have been using Iodoflex here. Finally the area on the left dorsal foot and right dorsal foot are really about the same extremely dry callused fissured areas. He does not yet have a dermatology appointment 2/17; left heel remains closed. He has a new open area on the left buttock. The area on the left lateral calf is bigger longer and still covered in necrotic debris. No major change in his foot areas bilaterally. I am awaiting for a dermatologist to look on this. We have been using ketoconazole I do not know that this is been doing any good at all. 2/24; left heel remains closed. The left buttock wound that was new reopening last week looks better. The left lateral calf appears better also although still requires debridement. The major area on his foot is the left first second also requiring debridement. We have been putting Prisma on all wounds. I do not believe that the ketoconazole has done too much good for his feet. He will use Lotrisone I am going to give him a 2-week course of terbinafine. We still do not have a dermatology appointment 3/2 left heel remains closed however there is skin over bone in this area I pointed this out to him today. The left buttock wound is epithelialized but still does not look completely stable. The area on the left leg required debridement were using silver collagen here. With regards to his feet we changed to Lotrisone last week and silver alginate. 3/9; left heel remains closed. Left buttock remains closed. The  area on the right foot is essentially closed. The left foot remains unchanged. Slightly smaller on the left lateral calf. Using silver collagen to both of these areas 3/16-Left heel remains closed. Area on right foot is closed. Left lateral calf above the lateral malleolus open wound requiring debridement with easy bleeding. Left dorsal wound proximal to first toe also debrided. Left ischial area open new. Patient has been using Prisma with wrapping every 3 days. Dermatology appointment is apparently tomorrow.Patient has completed his terbinafine 2-week course with some apparent improvement according to him, there is still flaking and dry skin in his foot on the left 3/23; area on the right foot is reopened. The area on the left anterior foot is about the same still a very necrotic adherent surface. He still has the area on the left leg and reopening is on the left buttock. He apparently saw dermatology although I do not have a note. According to the patient who is usually fairly well informed they did not have any good ideas. Put him on oral terbinafine which she is been on before. 3/30; using silver collagen to all wounds. Apparently his dermatologist put him on doxycycline and rifampin presumably some culture grew staph. I do not have this result. He remains on terbinafine although I have used terbinafine on him before 4/6; patient has had a fairly substantial reopening on the right foot between  the first and second toes. He is finished his terbinafine and I believe is on doxycycline and rifampin still as prescribed by dermatology. We have been using silver collagen to all his wounds although the patient reports that he thinks silver alginate does better on the wounds on his buttock. 4/13; the area on his left lateral calf about the same size but it did not require debridement. Left dorsal foot just proximal to the webspace between the first and second toes is about the same. Still nonviable  surface. I note some superficial bronze discoloration of the dorsal part of his foot Right dorsal foot just proximal to the first and second toes also looks about the same. I still think there may be the same discoloration I noted above on the left Left buttock wound looks about the same 4/20; left lateral calf appears to be gradually contracting using silver collagen. 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 The left dorsal foot wound is debrided of tightly adherent necrotic debris and really cleans up quite nicely. The right area is worse with expansion. I did not debride this it is now over the base of the second toe The area on his left buttock is smaller no debridement is required using silver collagen 5/4; left calf continues to make good progress. He arrives with erythema around the wounds on his dorsal foot which even extends to the plantar aspect. Very concerning for coexistent infection. He is finished the erythromycin I gave him for possible erythrasma this does not seem to have helped. The area on the left foot is about the same base of the dorsal toes Is area on the buttock looks improved on the left 5/11; left calf and left buttock continued to make good progress. Left foot is about the same to slightly improved. Major problem is on the right foot. He has not had an x-ray. Deep tissue culture I did last week showed both Enterobacter and E. coli. I did not change the doxycycline I put him on empirically although neither 1 of these were plated to doxycycline. He arrives today with the erythema looking worse on both the dorsal and plantar foot. Macerated skin on the bottom of the foot. he has not been systemically unwell 5/18-Patient returns at 1 week, left calf wound appears to be making some progress, left buttock wound appears slightly worse than last time, left foot wound looks slightly better, right foot redness is  marginally better. X-ray of both feet show no air or evidence of osteomyelitis. Patient is finished his Omnicef and terbinafine. He continues to have macerated skin on the bottom of the left foot as well as right 5/26; left calf wound is better, left buttock wound appears to have multiple small superficial open areas with surrounding macerated skin. X-rays that I did last time showed no evidence of osteomyelitis in either foot. He is finished cefdinir and doxycycline. I do not think that he was on terbinafine. He continues to have a large superficial open area on the right foot anterior dorsal and slightly between the first and second toes. I did send him to dermatology 2 months ago or so wondering about whether they would do a fungal scraping. I do not believe they did but did do a culture. We have been using silver alginate to the toe areas, he has been using antifungals at home topically either ketoconazole or Lotrisone. We are using silver collagen on the left foot, silver alginate on the  right, silver collagen on the left lateral leg and silver alginate on the left buttock 6/1; left buttock area is healed. We have the left dorsal foot, left lateral leg and right dorsal foot. We are using silver alginate to the areas on both feet and silver collagen to the area on his left lateral calf 6/8; the left buttock apparently reopened late last week. He is not really sure how this happened. He is tolerating the terbinafine. Using silver alginate to all wounds 6/15; left buttock wound is larger than last week but still superficial. Came in the clinic today with a report of purulence from the left lateral leg I did not identify any infection Both areas on his dorsal feet appear to be better. He is tolerating the terbinafine. Using silver alginate to all wounds 6/22; left buttock is about the same this week, left calf quite a bit better. His left foot is about the same however he comes in with erythema  and warmth in the right forefoot once again. Culture that I gave him in the beginning of May showed Enterobacter and E. coli. I gave him doxycycline and things seem to improve although neither 1 of these organisms was specifically plated. 6/29; left buttock is larger and dry this week. Left lateral calf looks to me to be improved. Left dorsal foot also somewhat improved right foot completely unchanged. The erythema on the right foot is still present. He is completing the Ceftin dinner that I gave him empirically [[name] discussion above.) 7/6 - All wounds look to be stable and perhaps improved, the left buttock wound is slightly smaller, per patient bleeds easily, completed ceftin, the right foot redness is less, he is on terbinafine 7/13; left buttock wound about the same perhaps slightly narrower. Area on the left lateral leg continues to narrow. Left dorsal foot slightly smaller right foot about the same. We are using silver alginate on the right foot and Hydrofera Blue to the areas on the left. Unna boot on the left 2 layer compression on the right 7/20; left buttock wound absolutely the same. Area on lateral leg continues to get better. Left dorsal foot require debridement as did the right no major change in the 7/27; left buttock wound the same size necrotic debris over the surface. The area on the lateral leg is closed once again. His left foot looks better right foot about the same although there is some involvement now of the posterior first second toe area. He is still on terbinafine which I have given him for a month, not certain a centimeter major change 06/25/19-All wounds appear to be slightly improved according to report, left buttock wound looks clean, both foot wounds have minimal to no debris the right dorsal foot has minimal slough. We are using Hydrofera Blue to the left and silver alginate to the right foot and ischial wound. 8/10-Wounds all appear to be around the same, the right  forefoot distal part has some redness which was not there before, however the wound looks clean and small. Ischial wound looks about the same with no changes 8/17; his wound on the left lateral calf which was his original chronic venous insufficiency wound remains closed. Since I last saw him the areas on the left dorsal foot right dorsal foot generally appear better but require debridement. The area on his left initial tuberosity appears somewhat larger to me perhaps hyper granulated and bleeds very easily. We have been using Hydrofera Blue to the left dorsal foot and  silver alginate to everything else 8/24; left lateral calf remains closed. The areas on his dorsal feet on the webspace of the first and second toes bilaterally both look better. The area on the left buttock which is the pressure ulcer stage II slightly smaller. I change the dressing to Hydrofera Blue to all areas 8/31; left lateral calf remains closed. The area on his dorsal feet bilaterally look better. Using Hydrofera Blue. Still requiring debridement on the left foot. No change in the left buttock pressure ulcers however 9/14; left lateral calf remains closed. Dorsal feet look quite a bit better than 2 weeks ago. Flaking dry skin also a lot better with the ammonium lactate I gave him 2 weeks ago. The area on the left buttock is improved. He states that his Roho cushion developed a leak and he is getting a new one, in the interim he is offloading this vigorously 9/21; left calf remains closed. Left heel which was a possible DTI looks better this week. He had macerated tissue around the left dorsal foot right foot looks satisfactory and improved left buttock wound. I changed his dressings to his feet to silver alginate bilaterally. Continuing Hydrofera Blue on the left buttock. 9/28 left calf remains closed. Left heel did not develop anything [possible DTI] dry flaking skin on the left dorsal foot. Right foot looks satisfactory.  Improved left buttock wound. We are using silver alginate on his feet Hydrofera Blue on the buttock. I have asked him to go back to the Lotrisone on his feet including the wounds and surrounding areas 10/5; left calf remains closed. The areas on the left and right feet about the same. A lot of this is epithelialized however debris over the remaining open areas. He is using Lotrisone and silver alginate. The area on the left buttock using Hydrofera Blue 10/26. Patient has been out for 3 weeks secondary to Covid concerns. He tested negative but I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 to use Apligraf to all 3 remaining wound areas. 06/07/16 patient's last Apligraf was 2 weeks ago. He arrives today with the 2 wounds on his lateral left leg joined together. This would have to be seen as a negative. He also has a small wound in his first and second toe on the left dorsally with quite a bit of surrounding erythema in the first second and third toes. This looks to be infected or inflamed, very difficult clinical call. 06/21/16: lateral left leg combined  wounds. Adherent surface slough area on the left dorsal foot at roughly the fourth toe looks improved 07/12/16; he now has a single linear wound on the lateral left leg. This does not look to be a lot changed from when I lost saw this. The area on his dorsal left foot looks considerably better however. 08/02/16; no major change in the substantial area on his left lateral leg since last time. We have been using Hydrofera Blue for a prolonged period of time now. The area on his left foot is also unchanged from last review 07/19/16; the area on his dorsal foot on the left looks considerably smaller. He is beginning to have significant rims of epithelialization on the lateral left leg wound. This also looks better. 08/05/16; the patient came in for a nurse visit today. Apparently the area on his left lateral leg looks better and it was wrapped. However in general discussion the patient noted a new area on the dorsal aspect of his right second toe. The exact etiology of this is unclear but likely relates to pressure. 08/09/16 really the area on the left lateral leg did not really look that healthy today perhaps slightly larger and measurements. The area on his dorsal right second toe is improved also the left foot wound looks stable to improved 08/16/16; the area on the last lateral leg did not change any of dimensions. Post debridement with a curet the area looked better. Left foot wound improved and the area on the dorsal right second toe is improved 08/23/16; the area on the left lateral leg may be slightly smaller both in terms of length and width. Aggressive debridement with a curette afterwards the tissue appears healthier. Left foot wound appears improved in the area on the dorsal right second toe is improved 08/30/16 patient developed a fever over the weekend and was seen in an urgent care. Felt to have a UTI and put on doxycycline. He has been since changed over the phone to [facility]. After we took  off the wrap on his right leg today the leg is swollen warm and erythematous, probably more likely the source of the fever 09/06/16; have been using collagen to the major left leg wound, silver alginate to the area on his anterior foot/toes 09/13/16; the areas on his anterior foot/toes on both sides appear to be virtually closed. Extensive wound on the left lateral leg perhaps slightly narrower but each visit still covered an adherent surface slough 09/16/16 patient was in for his usual Thursday nurse visit however the  intake nurse noted significant erythema of his dorsal right foot. He is also running a low-grade fever and having increasing spasms in the right leg 09/20/16 here for cellulitis involving his right great toes and forefoot. This is a lot better. Still requiring debridement on his left lateral leg. Santyl direct says he needs prior authorization. Therefore his wife cannot change this at home 09/30/16; the patient's extensive area on the left lateral calf and ankle perhaps somewhat better. Using Santyl. The area on the left toes is healed and I think the area on his right dorsal foot is healed as well. There is no cellulitis or venous inflammation involving the right leg. He is going to need compression stockings here. 10/07/16; the patient's extensive wound on the left lateral calf and ankle does not measure any differently however there appears to be less adherent surface slough using Santyl and aggressive weekly debridements 10/21/16; no major change in the area on the left lateral calf. Still the same measurement still very difficult to debridement adherent slough and nonviable subcutaneous tissue. This is not really been helped by several weeks of Santyl. Previously for 2 weeks I used Iodoflex for a short period. A prolonged course of Hydrofera Blue didn't really help. I'm not sure why I only used 2 weeks of Iodoflex on this there is no evidence of surrounding infection. He has  a small area on the right second toe which looks as though it's progressing towards closure 10/28/16; the wounds on his toes appear to be closed. No major change in the left lateral leg wound although the surface looks somewhat better using Iodoflex. He has had previous arterial studies that were normal. He has had reflux studies and is status post ablation although I don't have any exact notes on which vein was ablated. I'll need to check the surgical record 11/04/16; he's had a reopening between the first and second toe on the left and right. No major change in the left lateral leg wound. There is what appears to be cellulitis of the left dorsal foot 11/18/16 the patient was hospitalized initially in [place] and then subsequently transferred to [facility] and was admitted there from 11/09/16 through 11/12/16. He had developed progressive cellulitis on the right leg in spite of the doxycycline I gave him. I'd spoken to the hospitalist in [place] who was concerned about continuing leukocytosis. CT scan is what I suggested this was done which showed soft tissue swelling without evidence of osteomyelitis or an underlying abscess blood cultures were negative. At [facility] he was treated with vancomycin and Primaxin and then add an infectious disease consult. He was transitioned to Ceftaroline. He has been making progressive improvement. Overall a severe cellulitis of the right leg. He is been using silver alginate to her original wound on the left leg. The wounds in his toes on the right are closed there is a small open area on the base of the left second toe 11/26/15; the patient's right leg is much better although there is still some edema here this could be reminiscent from his severe cellulitis likely on top of some degree of lymphedema. His left anterior leg wound has less surface slough as reported by her intake nurse. Small wound at the base of the left second toe 12/02/16; patient's right leg  is better and there is no open wound here. His left anterior lateral leg wound continues to have a healthy-looking surface. Small wound at the base of the left second toe however there  is erythema in the left forefoot which is worrisome 12/16/16; is no open wounds on his right leg. We took measurements for stockings. His left anterior lateral leg wound continues to have a healthy-looking surface. I'm not sure where we were with the Apligraf run through his insurance. We have been using Iodoflex. He has a thick eschar on the left first second toe interface, I suspect this may be fungal however there is no visible open 12/23/16; no open wound on his right leg. He has 2 small areas left of the linear wound that was remaining last week. We have been using Prisma, I thought I have disclosed this week, we can only look forward to next week 01/03/17; the patient had concerning areas of erythema last week, already on doxycycline for UTI through his primary doctor. The erythema is absolutely no better there is warmth and swelling both medially from the left lateral leg wound and also the dorsal left foot. 01/06/17- Patient is here for follow-up evaluation of his left lateral leg ulcer and bilateral feet ulcers. He is on oral antibiotic therapy, tolerating that. Nursing staff and the patient states that the erythema is improved from Monday. 01/13/17; the predominant left lateral leg wound continues to be problematic. I had put Apligraf on him earlier this month once. However he subsequently developed what appeared to be an intense cellulitis around the left lateral leg wound. I gave him Dalvance I think on 2/12 perhaps 2/13 he continues on cefdinir. The erythema is still present but the warmth and swelling is improved. I am hopeful that the cellulitis part of this control. I wouldn't be surprised if there is an element of venous inflammation as well. 01/17/17. The erythema is present but better in the left leg.  His left lateral leg wound still does not have a viable surface buttons certain parts of this long thin wound it appears like there has been improvement in dimensions. 01/20/17; the erythema still present but much better in the left leg. I'm thinking this is his usual degree of chronic venous inflammation. The wound on the left leg looks somewhat better. Is less surface slough 01/27/17; erythema is back to the chronic venous inflammation. The wound on the left leg is somewhat better. I am back to the point where I like to try an Apligraf once again 02/10/17; slight improvement in wound dimensions. Apligraf #2. He is completing his doxycycline 02/14/17; patient arrives today having completed doxycycline last Thursday. This was supposed to be a nurse visit however once again he hasn't tense erythema from the medial part of his wound extending over the lower leg. Also erythema in his foot this is roughly in the same distribution as last time. He has baseline chronic venous inflammation however this is a lot worse than the baseline I have learned to accept the on him is baseline inflammation 02/24/17- patient is here for follow-up evaluation. He is tolerating compression therapy. His voicing no complaints or concerns he is here anticipating an Apligraf 03/03/17; he arrives today with an adherent necrotic surface. I don't think this is surface is going to be amenable for Apligraf's. The erythema around his wound and on the left dorsal foot has resolved he is off antibiotics 03/10/17; better-looking surface today. I don't think he can tolerate Apligraf's. He tells me he had a wound VAC after a skin graft years ago to this area and they had difficulty with a seal. The erythema continues to be stable around this some degree of chronic  venous inflammation but he also has recurrent cellulitis. We have been using Iodoflex 03/17/17; continued improvement in the surface and may be small changes in dimensions. Using  Iodoflex which seems the only thing that will control his surface 03/24/17- He is here for follow up evaluation of his LLE lateral ulceration and ulcer to right dorsal foot/toe space. He is voicing no complaints or concerns, He is tolerating compression wrap. 03/31/17 arrives today with a much healthier looking wound on the left lower extremity. We have been using Iodoflex for a prolonged period of time which has for the first time prepared and adequate looking wound bed although we have not had much in the way of wound dimension improvement. He also has a small wound between the first and second toe on the right 04/07/17; arrives today with a healthy-looking wound bed and at least the top 50% of this wound appears to be now her. No debridement was required I have changed him to [facility] last week after prolonged Iodoflex. He did not do well with Apligraf's. We've had a re-opening between the first and second toe on the right 04/14/17; arrives today with a healthier looking wound bed contractions and the top 50% of this wound and some on the lesser 50%. Wound bed appears healthy. The area between the first and second toe on the right still remains problematic 04/21/17; continued very gradual improvement. Using [facility] 04/28/17; continued very gradual improvement in the left lateral leg venous insufficiency wound. His periwound erythema is very mild. We have been using Hydrofera Blue. Wound is making progress especially in the superior 50% 05/05/17; he continues to have very gradual improvement in the left lateral venous insufficiency wound. Both in terms with an length rings are improving. I debrided this every 2 weeks with #5 curet and we have been using Hydrofera Blue and again making good progress With regards to the wounds between his right first and second toe which I thought might of been tinea pedis he is not making as much progress very dry scaly skin over the area. Also the area at  the base of the left first and second toe in a similar condition 05/12/17; continued gradual improvement in the refractory left lateral venous insufficiency wound on the left. Dimension smaller. Surface still requiring debridement using Hydrofera Blue 05/19/17; continued gradual improvement in the refractory left lateral venous ulceration. Careful inspection of the wound bed underlying rumination suggested some degree of epithelialization over the surface no debridement indicated. Continue Hydrofera Blue difficult areas between his toes first and third on the left than first and second on the right. I'm going to change to silver alginate from silver collagen. Continue ketoconazole as I suspect underlying tinea pedis 05/26/17; left lateral leg venous insufficiency wound. We've been using Hydrofera Blue. I believe that there is expanding epithelialization over the surface of the wound albeit not coming from the wound circumference. This is a bit of an odd situation in which the epithelialization seems to be coming from the surface of the wound rather than in the exact circumference. There is still small open areas mostly along the lateral margin of the wound. ooHe has unchanged areas between the left first and second and the right first second toes which I been treating for tenia pedis 06/02/17; left lateral leg venous insufficiency wound. We have been using Hydrofera Blue. Somewhat smaller from the wound circumference. The surface of the wound remains a bit on it almost epithelialized sedation in appearance. I use  an open curette today debridement in the surface of all of this especially the edges ooSmall open wounds remaining on the dorsal right first and second toe interspace and the plantar left first second toe and her face on the left 06/09/17; wound on the left lateral leg continues to be smaller but very gradual and very dry surface using Hydrofera Blue 06/16/17 requires weekly debridements  now on the left lateral leg although this continues to contract. I changed to silver collagen last week because of dryness of the wound bed. Using Iodoflex to the areas on his first and second toes/web space bilaterally 06/24/17; patient with history of paraplegia also chronic venous insufficiency with lymphedema. Has a very difficult wound on the left lateral leg. This has been gradually reducing in terms of with but comes in with a very dry adherent surface. High switch to silver collagen a week or so ago with hydrogel to keep the area moist. This is been refractory to multiple dressing attempts. He also has areas in his first and second toes bilaterally in the anterior and posterior web space. I had been using Iodoflex here after a prolonged course of silver alginate with ketoconazole was ineffective [question tinea pedis] 07/14/17; patient arrives today with a very difficult adherent material over his left lateral lower leg wound. He also has surrounding erythema and poorly controlled edema. He was switched his Santyl last visit which the nurses are applying once during his doctor visit and once on a nurse visit. He was also reduced to 2 layer compression I'm not exactly sure of the issue here. 07/21/17; better surface today after 1 week of Iodoflex. Significant cellulitis that we treated last week also better. [Doxycycline] 07/28/17 better surface today with now 2 weeks of Iodoflex. Significant cellulitis treated with doxycycline. He has now completed the doxycycline and he is back to his usual degree of chronic venous inflammation/stasis dermatitis. He reminds me he has had ablations surgery here 08/04/17; continued improvement with Iodoflex to the left lateral leg wound in terms of the surface of the wound although the dimensions are better. 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 Problematic areas on the plantar aspect of  the first second toe web space on the left and the dorsal aspect of the first second toe web space on the right. At one point I felt these were probably related to chronic fungal infections in treated him aggressively for this although we have not made any improvement here. 08/11/17; left lateral leg. Surface continues to improve with the Iodoflex although we are not seeing much improvement in overall wound dimensions. Areas on his plantar left foot and right foot show no improvement. In fact the right foot looks somewhat worse 08/18/17; left lateral leg. We changed to [facility] Blue last week after a prolonged course of Iodoflex which helps get the surface better. It appears that the wound with is improved. Continue with difficult areas on the left dorsal first second and plantar first second on the right 09/01/17; patient arrives in clinic today having had a temperature of 103 yesterday. He was seen in the ER and [facility]. The patient was concerned he could have cellulitis again in the right leg however they diagnosed him with a UTI and he is now on Keflex. He has a history of cellulitis which is been recurrent and difficult but this is been in the left leg, in the past 5 use doxycycline. He does in  and out catheterizations at home which are risk factors for UTI 09/08/17; patient will be completing his Keflex this weekend. The erythema on the left leg is considerably better. He has a new wound today on the medial part of the right leg small superficial almost looks like a skin tear. He has worsening of the area on the right dorsal first and second toe. His major area on the left lateral leg is better. Using Hydrofera Blue on all areas 09/15/17; gradual reduction in width on the long wound in the left lateral leg. No debridement required. He also has wounds on the plantar aspect of his left first second toe web space and on the dorsal aspect of the right first second toe web space. 09/22/17;  there continues to be very gradual improvements in the dimensions of the left lateral leg wound. He hasn't round erythematous spot with might be pressure on his wheelchair. There is no evidence obviously of infection no purulence no warmth ooHe has a dry scaled area on the plantar aspect of the left first second toe ooImproved area on the dorsal right first second toe. 09/29/17; left lateral leg wound continues to improve in dimensions mostly with an is still a fairly long but increasingly narrow wound. ooHe has a dry scaled area on the plantar aspect of his left first second toe web space ooIncreasingly concerning area on the dorsal right first second toe. In fact I am concerned today about possible cellulitis around this wound. The areas extending up his second toe and although there is deformities here almost appears to abut on the nailbed. 10/06/17; left lateral leg wound continues to make very gradual progress. Tissue culture I did from the right first second toe dorsal foot last time grew MRSA and enterococcus which was vancomycin sensitive. This was not sensitive to clindamycin or doxycycline. He is allergic to Zyvox and sulfa we have therefore arrange for him to have dalvance infusion tomorrow. He is had this in the past and tolerated it well 10/20/17; left lateral leg wound continues to make decent progress. This is certainly reduced in terms of with there is advancing epithelialization.ooThe cellulitis in the right foot looks better although he still has a deep wound in the dorsal aspect of the first second toe web space. Plantar left first toe web space on the left I think is making some progress 10/27/17; left lateral leg wound continues to make decent progress. Advancing epithelialization.using Hydrofera Blue ooThe right first second toe web space wound is better-looking using silver alginate ooImprovement in the left plantar first second toe web space. Again using silver  alginate 11/03/17 left lateral leg wound continues to make decent progress albeit slowly. Using Hydrofera Blue ooThe right per second toe web space continues to be a very problematic looking punched out wound. I obtained a piece of tissue for deep culture I did extensively treated this for fungus. It is difficult to imagine that this is a pressure area as the patient states other than going outside he doesn't really wear shoes at home ooThe left plantar first second toe web space looked fairly senescent. Necrotic edges. This required debridement oochange to Hydrofera Blue to all wound areas 11/10/17; left lateral leg wound continues to contract. Using Hydrofera Blue ooOn the right dorsal first second toe web space dorsally. Culture I did of this area last week grew MRSA there is not an easy oral option in this patient was multiple antibiotic allergies or intolerances. This was only a rare culture  isolate I'm therefore going to use Bactroban under silver alginate ooOn the left plantar first second toe web space. Debridement is required here. This is also unchanged 11/17/17; left lateral leg wound continues to contract using Hydrofera Blue this is no longer the major issue. ooThe major concern here is the right first second toe web space. He now has an open area going from dorsally to the plantar aspect. There is now wound on the inner lateral part of the first toe. Not a very viable surface on this. There is erythema spreading medially into the forefoot. ooNo major change in the left first second toe plantar wound 11/24/17; left lateral leg wound continues to contract using Hydrofera Blue. Nice improvement today ooThe right first second toe web space all of this looks a lot less angry than last week. I have given him clindamycin and topical Bactroban for MRSA and terbinafine for the possibility of underlining tinea pedis that I could not control with ketoconazole. Looks somewhat  better ooThe area on the plantar left first second toe web space is weeping with dried debris around the wound 12/01/17; left lateral leg wound continues to contract he Hydrofera Blue. It is becoming thinner in terms of with nevertheless it is making good improvement. ooThe right first second toe web space looks less angry but still a large necrotic-looking wounds starting on the plantar aspect of the right foot extending between the toes and now extensively on the base of the right second toe. I gave him clindamycin and topical Bactroban for MRSA anterior benefiting for the possibility of underlying tinea pedis. Not looking better today ooThe area on the left first/second toe looks better. Debrided of necrotic debris 12/05/17* the patient was worked in urgently today because over the weekend he found blood on his incontinence bad when he woke up. He was found to have an ulcer by his wife who does most of his wound care. He came in today for [place] to look at this. He has not had a history of wounds in his buttocks in spite of his paraplegia. 12/08/17; seen in follow-up today at his usual appointment. He was seen earlier this week and found to have a new wound on his buttock. We also follow him for wounds on the left lateral leg, left first second toe web space and right first second toe web space 12/15/17; we have been using Hydrofera Blue to the left lateral leg which has improved. The right first second toe web space has also improved. Left first second toe web space plantar aspect looks stable. The left buttock has worsened using Santyl. Apparently the buttock has drainage 12/22/17; we have been using Hydrofera Blue to the left lateral leg which continues to improve now 2 small wounds separated by normal skin. He tells [place] he had a fever up to 100 yesterday he is prone to UTIs but has not noted anything different. He does in and out catheterizations. The area between the first and second toes today  does not look good necrotic surface covered with what looks to be purulent drainage and erythema extending into the third toe. I had gotten this to something that I thought look better last time however it is not look good today. He also has a necrotic surface over the buttock wound which is expanded. I thought there might be infection under here so I removed a lot of the surface with a #5 curet though nothing look like it really needed culturing. He is been using  Santyl to this area 12/27/17; his original wound on the left lateral leg continues to improve using Hydrofera Blue. I gave him samples of Baxdella although he was unable to take them out of fear for an allergic reaction ["lump in his throat"].the culture I did of the purulent drainage from his second toe last week showed both enterococcus and a set Enterobacter I was also concerned about the erythema on the bottom of his foot although paradoxically although this looks somewhat better today. 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 We've been using silver alginate here. They came up today that he sleeps in a recliner, I'm not sure why but I asked him to stop this 01/03/18; his original wound we've been using Hydrofera Blue is now separated into 2 areas. ooUlcer on his left buttock is better he is off the recliner and sleeping in bed ooFinally both wound areas between his first and second toes also looks some better 01/10/18; his original wound on the left lateral leg is now separated into 2 wounds we've been using Hydrofera Blue ooUlcer on his left buttock has some drainage. There is a small probing site going into muscle layer superiorly.using silver alginate -He arrives today with a deep tissue injury on the left heel ooThe wound on the dorsal aspect of his first second toe on the left looks a lot betterusing silver  alginate ketoconazole ooThe area on the first second toe web space on the right also looks a lot bette 01/17/18; his original wound on the left lateral leg continues to progress using Hydrofera Blue ooUlcer on his left buttock also is smaller surface healthier except for a small probing site going into the muscle layer superiorly. 2.4 cm of tunneling in this area ooDTI on his left heel we have only been offloading. Looks better than last week no threatened open no evidence of infection oothe wound on the dorsal aspect of the first second toe on the left continues to look like it's regressing we have only been using silver alginate and terbinafine orally ooThe area in the first second toe web space on the right also looks to be a lot better using silver alginate and terbinafine I think this was prompted by tinea pedis 01/31/18; the patient was hospitalized in [place] last week apparently for a complicated UTI. He was discharged on cefepime he does in and out catheterizations. In the hospital he was discovered M I don't mild elevation of AST and ALTs and the terbinafine was stopped.predictably the pressure ulcer on his buttock looks betterusing silver alginate. The area on the left lateral leg also is better using Hydrofera Blue. The area between the first and second toes on the left better. First and second toes on the right still substantial but better. Finally the DTI on the left heel has held together and looks like it's resolving 02/07/18-he is here in follow-up evaluation for multiple ulcerations. He has new injury to the lateral aspect of the last issue a pressure ulcer, he states this is from adhesive removal trauma. He states he has tried multiple adhesive products with no success. All other ulcers appear stable. The left heel DTI is resolving. We will continue with same treatment plan and follow-up next week. 02/14/18; follow-up for multiple areas. ooHe has a new area last week on  the lateral aspect of his pressure ulcer more over the posterior trochanter. The original pressure ulcer looks  quite stable has healthy granulation. 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 Almost fully epithelialized on the original superior area using Hydrofera Blue ooDTI on the left heel has peeled off this week to reveal a small superficial wound under denuded skin and subcutaneous tissue ooBoth areas between the first and second toes look better including nothing open on the left 02/21/18; ooThe patient's wounds on his left ischial tuberosity and posterior left greater trochanter actually looked better. He has a large area of irritation around the area which I think is contact dermatitis. I am doubtful that this is fungal ooHis original wound on the left lateral calf continues to improve we have been using Hydrofera Blue ooThere is no open area in the left first second toe web space although there is a lot of thick callus ooThe DTI on the left heel required debridement today of necrotic surface eschar and subcutaneous tissue using silver alginate ooFinally the area on the right first second toe webspace continues to contract using silver alginate and ketoconazole 02/28/18 ooLeft ischial tuberosity wounds look better using silver alginate. ooOriginal wound on the left calf only has one small open area left using Hydrofera Blue ooDTI on the left heel required debridement mostly removing skin from around this wound surface. Using silver alginate ooThe areas on the right first/second toe web space using silver alginate and ketoconazole 03/08/18 on evaluation today patient appears to be doing decently well as best I can tell in regard to his wounds. This is the first time that I have seen him as he generally is followed by Dr. Dellia Nims. With that being said none of his wounds  appear to be infected he does have an area where there is some skin covering what appears to be a new wound on the left dorsal surface of his great toe. This is right at the nail bed. With that being said I do believe that debrided away some of the excess skin can be of benefit in this regard. Otherwise he has been tolerating the dressing changes without complication. 03/14/18; patient arrives today with the multiplicity of wounds that we are following. He has not been systemically unwell ooOriginal wound on the left lateral calf now only has 2 small open areas we've been using Hydrofera Blue which should continue ooThe deep tissue injury on the left heel requires debridement today. We've been using silver alginate ooThe left first second toe and the right first second toe are both are reminiscence what I think was tinea pedis. Apparently some of the callus Surface between the toes was removed last week when it started draining. ooPurulent drainage coming from the wound on the ischial tuberosity on the left. 03/21/18-He is here in follow-up evaluation for multiple wounds. 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 He tolerated debridement. The only change to last week's recommendations is to discontinue antifungal cream between toes. He will follow-up next week 03/28/18; following up for multiple wounds;Concern this week is streaking redness and swelling in the right foot. He is going to need antibiotics for this. 03/31/18; follow-up for right foot cellulitis. Streaking redness and swelling in the right foot on 03/28/18. 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 I put him on clindamycin 300 mg every 6 and brought him in for a quick check. He has an open wound between his first and  second toes on the right foot as a potential source. 04/04/18; ooRight foot cellulitis is resolving he is completing  clindamycin. This is truly good news ooLeft lateral calf wound which is initial wound only has one small open area inferiorly this is close to healing out. He has compression stockings. We will use Hydrofera Blue right down to the epithelialization of this ooNonviable surface on the left heel which was initially pressure with a DTI. We've been using Hydrofera Blue. I'm going to switch this back to silver alginate ooLeft first second toe/tinea pedis this looks better using silver alginate ooRight first second toe tinea pedis using silver alginate ooLarge pressure ulcers on theLeft ischial tuberosity. Small wound here Looks better. I am uncertain about the surface over the large wound. Using silver alginate 04/11/18; ooCellulitis in the right foot is resolved ooLeft lateral calf wound which was his original wounds still has 2 tiny open areas remaining this is just about closed ooNonviable surface on the left heel is better but still requires debridement ooLeft first second toe/tinea pedis still open using silver alginate ooRight first second toe wound tinea pedis I asked him to go back to using ketoconazole and silver alginate ooLarge pressure ulcers on the left ischial tuberosity this shear injury here is resolved. Wound is smaller. No evidence of infection using silver alginate 04/18/18; ooPatient arrives with an intense area of cellulitis in the right mid lower calf extending into the right heel area. Bright red and warm. Smaller area on the left anterior leg. He has a significant history of MRSA. He will definitely need antibioticsoodoxycycline ooHe now has 2 open areas on the left ischial tuberosity the original large wound and now a satellite area which I think was above his initial satellite areas. Not a wonderful surface on this satellite area surrounding erythema which looks like pressure related. ooHis left lateral calf wound again his original wound is just about  closed ooLeft heel pressure injury still requiring debridement ooLeft first second toe looks a lot better using silver alginate ooRight first second toe also using silver alginate and ketoconazole cream also looks better 04/20/18; the patient was worked in early today out of concerns with his cellulitis on the right leg. I had started him on doxycycline. This was 2 days ago. His wife was concerned about the swelling in the area. Also concerned about the left buttock. He has not been systemically unwell no fever chills. No nausea vomiting or diarrhea 04/25/18; the patient's left buttock wound is continued to deteriorate he is using Hydrofera Blue. He is still completing clindamycin for the cellulitis on the right leg although all of this looks better. 05/02/18 ooLeft buttock wound still with a lot of drainage and a very tightly adherent fibrinous necrotic surface. He has a deeper area superiorly ooThe left lateral calf wound is still closed ooDTI wound on the left heel necrotic surface especially the circumference using Iodoflex ooAreas between his left first second toe and right first second toe both look better. Dorsally and the right first second toe he had a necrotic surface although at smaller. In using silver alginate and ketoconazole. I did a culture last week which was a deep tissue culture of the reminiscence of the open wound on the right first second toe dorsally. This grew a few Acinetobacter and a few methicillin-resistant staph aureus. Nevertheless the area actually this week looked better. I didn't feel the need to specifically address this at least in terms of systemic antibiotics. 05/09/18; wounds are  measuring larger more drainage per our intake. We are using Santyl covered with alginate on the large superficial buttock wounds, Iodosorb on the left heel, ketoconazole and silver alginate to the dorsal first and second toes bilaterally. 05/16/18; ooThe area on his left  buttock better in some aspects although the area superiorly over the ischial tuberosity required an extensive debridement.using Santyl ooLeft heel appears stable. Using Iodoflex ooThe areas between his first and second toes are not bad however there is spreading erythema up the dorsal aspect of his left foot this looks like cellulitis again. He is insensate the erythema is really very brilliant.o Erysipelas He went to [name] allergist days ago because he was itching part of this he had lab work done. This showed a white count of 15.1 with 70% neutrophils. Hemoglobin of 11.4 and a platelet count of 659,000. Last white count we had in [facility] was a 2-1/2 years ago which was 25.9 but he was ill at the time. He was able to show me some lab work that was done by his primary physician the pattern is about the same. 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 But prompted me to go ahead and do x-rays of both feet and the pelvis rule out osteomyelitis. He also had a comprehensive metabolic panel this was reasonably normal his albumin was 3.7 liver function tests BUN/creatinine all normal 05/23/18; x-rays of both his feet from last week were negative for underlying pulmonary abnormality. The x-ray of his pelvis however showed mild irregularity in the left ischial which may represent some early osteomyelitis. The wound in the left ischial continues to get deeper clearly now exposed muscle. Each week necrotic surface material over this area. Whereas the rest of the wounds do not look so bad. ooThe left ischial wound we have been using Santyl and calcium alginate ooTo the left heel surface necrotic debris using Iodoflex ooThe left lateral leg is still healed ooAreas on the left dorsal foot and the right dorsal foot are about the same. There is some inflammation on the left which might represent contact dermatitis, fungal dermatitis I am doubtful cellulitis  although this looks better than last week 05/30/18; CT scan done at Hospital did not show any osteomyelitis or abscess. Suggested the possibility of underlying cellulitis although I don't [name] a lot of evidence of this at the bedside ooThe wound itself on the left buttock/upper thigh actually looks somewhat better. No debridement ooLeft heel also looks better no debridement continue Iodoflex ooBoth dorsal first second toe spaces appear better using Lotrisone. Left still required debridement 06/06/18; ooIntake reported some purulent looking drainage from the left gluteal wound. Using Santyl and calcium alginate ooLeft heel looks better although still a nonviable surface requiring debridement ooThe left dorsal foot first/second webspace actually expanding and somewhat deeper. I may consider doing a shave biopsy of this area ooRight dorsal foot first/second webspace appears stable to improved. Using Lotrisone and silver alginate to both these areas 06/13/18 ooLeft gluteal surface looks better. Now separated in the 2 wounds. No debridement required. Still drainage. We'll continue silver alginate ooLeft heel continues to look better with Iodoflex continue this for at least another week ooOf his dorsal foot wounds the area on the left still has some depth although it looks better than last week. We've been using Lotrisone and silver alginate 06/20/18 ooLeft gluteal continues to look better healthy tissue ooLeft heel continues to look better healthy granulation wound is  smaller. He is using Iodoflex and his long as this continues continue the Iodoflex ooDorsal right foot looks better unfortunately dorsal left foot does not. There is swelling and erythema of his forefoot. He had minor trauma to this several days ago but doesn't think this was enough to have caused any tissue injury. Foot looks like cellulitis, we have had this problem before 06/27/18 on evaluation today patient appears to be  doing a little worse in regard to his foot ulcer. 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 Both of which would really be his only options for treating this infection. In the past he has been given and effusion of Orbactiv. This is done very well for him in the past again it's one time dosing IV antibiotic therapy. Subsequently I do believe this is something we're gonna need to [name] about doing at this point in time. Currently his other wounds seem to be doing somewhat better in my pinion I'm pretty happy in that regard. 07/03/18 on evaluation today patient's wounds actually appear to be doing fairly well. He has been tolerating the dressing changes without complication. All in all he seems to be showing signs of improvement. In regard to the antibiotics he has been dealing with infectious disease since I saw him last week as far as getting this scheduled. In the end [facility]'s [facility] this coming Friday. In the meantime he has been continuing to perform the dressing changes in such as previous. There does not appear to be any evidence of infection worsengin at this time. 07/10/18; ooSince I last saw this man 2 weeks ago things have actually improved. IV antibiotics of resulted in less forefoot erythema although there is still some present. He is not systemically unwell ooLeft buttock wounds o2 now have no depth there is increased epithelialization Using silver alginate ooLeft heel still requires debridement using Iodoflex ooLeft dorsal foot still with a sizable wound about the size of a border but healthy granulation ooRight dorsal foot still with a slitlike area using silver alginate 07/18/18; the patient's cellulitis in the left foot is improved in fact I think it is on its way to  resolving. ooLeft buttock wounds o2 both look better although the larger one has hypertension granulation we've been using silver alginate ooLeft heel has some thick circumferential redundant skin over the wound edge which will need to be removed today we've been using Iodoflex ooLeft dorsal foot is still a sizable wound required debridement using silver alginate ooThe right dorsal foot is just about closed only a small open area remains here 07/25/18; left foot cellulitis is resolved ooLeft buttock wounds o2 both look better. Hyper-granulation on the major area ooLeft heel as some debris over the surface but otherwise looks a healthier wound. Using silver collagen ooRight dorsal foot is just about closed 07/31/18; arrives with our intake nurse worried about purulent drainage from the buttock. We had hyper-granulation here last week ooHis buttock wounds o2 continue to look better ooLeft heel some debris over the surface but measuring smaller. ooRight dorsal foot unfortunately has openings between the toes ooLeft foot superficial wound looks less aggravated. 08/07/18 ooButtock wounds continue to look better although some of her granulation and the larger medial wound. silver alginate ooLeft heel continues to look a lot better.silver collagen   ooLeft foot superficial wound looks less stable. Requires debridement. He has a new wound superficial area on the foot on the lateral dorsal foot. ooRight foot looks better using silver alginate without Lotrisone 08/14/2018; patient was in the ER last week diagnosed with a UTI. He is now on Cefpodoxime and Macrodantin. ooButtock wounds continued to be smaller. Using silver alginate ooLeft heel continues to look better using silver collagen ooLeft foot superficial wound looks as though it is improving ooRight dorsal foot area is just about healed. 08/21/2018; patient is completed his antibiotics for his UTI. ooHe has 2 open areas on  the buttocks. There is still not closed although the surface looks satisfactory. Using silver alginate ooLeft heel continues to improve using silver collagen ooThe bilateral dorsal foot areas which are at the base of his first and second toes/possible tinea pedis are actually stable on the left but worse on the right. The area on the left required debridement of necrotic surface. After debridement I obtained a specimen for PCR culture. ooThe right dorsal foot which is been just about healed last week is now reopened 08/28/2018; culture done on the left dorsal foot showed coag negative staph both staph epidermidis and Lugdunensis. I think this is worthwhile initiating systemic treatment. I will use doxycycline given his long list of allergies. The area on the left heel slightly improved but still requiring debridement. ooThe large wound on the buttock is just about closed whereas the smaller one is larger. Using silver alginate in this area 09/04/2018; patient is completing his doxycycline for the left foot although this continues to be a very difficult wound area with very adherent necrotic debris. We are using silver alginate to all his wounds right foot left foot and the small wounds on his buttock, silver collagen on the left heel. 09/11/2018; once again this patient has intense erythema and swelling of the left forefoot. Lesser degrees of erythema in the right foot. He has a long list of allergies and intolerances. I will reinstitute doxycycline. 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 Using silver alginate ooLeft heel also looks better using silver collagen ooUnfortunately both the areas on his feet look worse. The area on the left first second webspace is now gone through to the plantar part of his foot. The area on the left foot anteriorly is irritated with erythema and swelling in the forefoot. 09/25/2018 ooHis wound on the  left plantar heel looks better. Using silver collagen ooThe area on the left buttock 2 small remnant areas. One is closed one is still open. Using silver alginate ooThe areas between both his first and second toes look worse. This in spite of long-standing antifungal therapy with ketoconazole and silver alginate which should have antifungal activity ooHe has small areas around his original wound on the left calf one is on the bottom of the original scar tissue and one superiorly both of these are small and superficial but again given wound history in this site this is worrisome 10/02/2018 ooLeft plantar heel continues to gradually contract using silver collagen ooLeft buttock wound is unchanged using silver alginate ooThe areas on his dorsal feet between his first and second toes bilaterally look about the same. 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 Small open area here. We have  been using silver alginate 10/09/2018; ooLeft plantar heel continues to look healthy and contract. No debridement is required ooLeft buttock slightly smaller with a tape injury wound just below which was new this week ooDorsal feet somewhat improved I have been using clindamycin ooLeft lateral looks lower extremity the actual open area looks worse although a lot of this is epithelialized. I am going to change to silver collagen today He has a lot more swelling in the right leg although this is not pitting not red and not particularly warm there is a lot of spasm in the right leg usually indicative of people with paralysis of some underlying discomfort. We have reviewed his vascular status from 2017 he had a left greater saphenous vein ablation. I wonder about referring him back to vascular surgery if the area on the left leg  continues to deteriorate. 10/16/2018 in today for follow-up and management of multiple lower extremity ulcers. His left Buttock wound is much lower smaller and almost closed completely. The wound to the left ankle has began to reopen with Epithelialization and some adherent slough. He has multiple new areas to the left foot and leg. The left dorsal foot without much improvement. Wound present between left great webspace and 2nd toe. Erythema and edema present right leg. Right LE ultrasound obtained on 10/10/18 was negative for DVT. 10/23/2018; ooLeft buttock is closed over. Still dry macerated skin but there is no open wound. I suspect this is chronic pressure/moisture ooLeft lateral calf is quite a bit worse than when I saw this last. There is clearly drainage here he has macerated skin into the left plantar heel. We will change the primary dressing to alginate ooLeft dorsal foot has some improvement in overall wound area. Still using clindamycin and silver alginate ooRight dorsal foot about the same as the left using clindamycin and silver alginate ooThe erythema in the right leg has resolved. He is DVT rule out was negative ooLeft heel pressure area required debridement although the wound is smaller and the surface is health 10/26/2018 ooThe patient came back in for his nurse check today predominantly because of the drainage coming out of the left lateral leg with a recent reopening of his original wound on the left lateral calf. He comes in today with a large amount of surrounding erythema around the wound extending from the calf into the ankle and even in the area on the dorsal foot. He is not systemically unwell. He is not febrile. Nevertheless this looks like cellulitis. We have been using silver alginate to the area. I changed him to a regular visit and I am going to prescribe him doxycycline. The rationale here is a long list of medication intolerances and a history of MRSA. I did  not [name] anything that I thought would provide a valuable culture 10/30/2018 ooFollow-up from his appointment 4 days ago with really an extensive area of cellulitis in the left calf left lateral ankle and left dorsal foot. I put him on doxycycline. He has a long list of medication allergies which are true allergy reactions. 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 In any case he is a lot better today. The erythema especially in the anterior and lateral left calf is better. He still has left ankle erythema. He also is complaining about increasing edema in the right leg we have only been using Kerlix Coban and he has been doing the wraps at home. Finally he  has a spotty rash on the medial part of his upper left calf which looks like folliculitis or perhaps wrap occlusion type injury. Small superficial macules not pustules 11/06/18 patient arrives today with again a considerable degree of erythema around the wound on the left lateral calf extending into the dorsal ankle and dorsal foot. This is a lot worse than when I saw this last week. He is on doxycycline really with not a lot of improvement. He has not been systemically unwell Wounds on the; left heel actually looks improved. Original area on the left foot and proximity to the first and second toes looks about the same. He has superficial areas on the dorsal foot, anterior calf and then the reopening of his original wound on the left lateral calf which looks about the same ooThe only area he has on the right is the dorsal webspace first and second which is smaller. ooHe has a large area of dry erythematous skin on the left buttock small open area here. 11/13/2018; the patient arrives in much better condition. The erythema around the wound on the left lateral calf is a lot better. Not sure whether this was the clindamycin or the TCA and ketoconazole or just in the improvement  in edema control [stasis dermatitis]. In any case this is a lot better. The area on the left heel is very small and just about resolved using silver collagen we have been using silver alginate to the areas on his dorsal feet 11/20/2018; his wounds include the left lateral calf, left heel, dorsal aspects of both feet just proximal to the first second webspace. He is stable to slightly improved. I did not think any changes to his dressings were going to be necessary 11/27/2018 he has a reopening on the left buttock which is surrounded by what looks like tinea or perhaps some other form of dermatitis. The area on the left dorsal foot has some erythema around it I have marked this area but I am not sure whether this is cellulitis or not. Left heel is not closed. Left calf the reopening is really slightly longer and probably worse 1/13; in general things look better and smaller except for the left dorsal foot. Area on the left heel is just about closed, left buttock looks better only a small wound remains in the skin looks better [using Lotrisone] 1/20; the area on the left heel only has a few remaining open areas here. Left lateral calf about the same in terms of size, left dorsal foot slightly larger right lateral foot still not closed. The area on the left buttock has no open wound and the surrounding skin looks a lot better 1/27; the area on the left heel is closed. Left lateral calf better but still requiring extensive debridements. The area on his left buttock is closed. He still has the open areas on the left dorsal foot which is slightly smaller in the right foot which is slightly expanded. We have been using Iodoflex on these areas as well 2/3; left heel is closed. Left lateral calf still requiring debridement using Iodoflex there is no open area on his left buttock however he has dry scaly skin over a large area of this. Not really responding well to the Lotrisone. Finally the areas on his  dorsal feet at the level of the first second webspace are slightly smaller on the right and about the same on the left. Both of these vigorously debrided with Anasept and gauze 2/10; left heel remains  closed he has dry erythematous skin over the left buttock but there is no open wound here. Left lateral leg has come in and with. Still requiring debridement we have been using Iodoflex here. Finally the area on the left dorsal foot and right dorsal foot are really about the same extremely dry callused fissured areas. He does not yet have a dermatology appointment 2/17; left heel remains closed. He has a new open area on the left buttock. The area on the left lateral calf is bigger longer and still covered in necrotic debris. No major change in his foot areas bilaterally. I am awaiting for a dermatologist to look on this. We have been using ketoconazole I do not know that this is been doing any good at all. 2/24; left heel remains closed. The left buttock wound that was new reopening last week looks better. The left lateral calf appears better also although still requires debridement. The major area on his foot is the left first second also requiring debridement. We have been putting Prisma on all wounds. I do not believe that the ketoconazole has done too much good for his feet. He will use Lotrisone I am going to give him a 2-week course of terbinafine. We still do not have a dermatology appointment 3/2 left heel remains closed however there is skin over bone in this area I pointed this out to him today. The left buttock wound is epithelialized but still does not look completely stable. The area on the left leg required debridement were using silver collagen here. With regards to his feet we changed to Lotrisone last week and silver alginate. 3/9; left heel remains closed. Left buttock remains closed. The area on the right foot is essentially closed. The left foot remains unchanged. Slightly  smaller on the left lateral calf. Using silver collagen to both of these areas 3/16-Left heel remains closed. Area on right foot is closed. Left lateral calf above the lateral malleolus open wound requiring debridement with easy bleeding. Left dorsal wound proximal to first toe also debrided. Left ischial area open new. Patient has been using Prisma with wrapping every 3 days. Dermatology appointment is apparently tomorrow.Patient has completed his terbinafine 2-week course with some apparent improvement according to him, there is still flaking and dry skin in his foot on the left 3/23; area on the right foot is reopened. The area on the left anterior foot is about the same still a very necrotic adherent surface. He still has the area on the left leg and reopening is on the left buttock. He apparently saw dermatology although I do not have a note. According to the patient who is usually fairly well informed they did not have any good ideas. Put him on oral terbinafine which she is been on before. 3/30; using silver collagen to all wounds. Apparently his dermatologist put him on doxycycline and rifampin presumably some culture grew staph. I do not have this result. He remains on terbinafine although I have used terbinafine on him before 4/6; patient has had a fairly substantial reopening on the right foot between the first and second toes. He is finished his terbinafine and I believe is on doxycycline and rifampin still as prescribed by dermatology. We have been using silver collagen to all his wounds although the patient reports that he thinks silver alginate does better on the wounds on his buttock. 4/13; the area on his left lateral calf about the same size but it did not require debridement. ooLeft dorsal  foot just proximal to the webspace between the first and second toes is about the same. Still nonviable surface. I note some superficial bronze discoloration of the dorsal part of his  foot ooRight dorsal foot just proximal to the first and second toes also looks about the same. I still think there may be the same discoloration I noted above on the left ooLeft buttock wound looks about the same 4/20; left lateral calf appears to be gradually contracting using silver collagen. 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 The left dorsal foot wound is debrided of tightly adherent necrotic debris and really cleans up quite nicely. The right area is worse with expansion. I did not debride this it is now over the base of the second toe ooThe area on his left buttock is smaller no debridement is required using silver collagen 5/4; left calf continues to make good progress. ooHe arrives with erythema around the wounds on his dorsal foot which even extends to the plantar aspect. Very concerning for coexistent infection. He is finished the erythromycin I gave him for possible erythrasma this does not seem to have helped. ooThe area on the left foot is about the same base of the dorsal toes ooIs area on the buttock looks improved on the left 5/11; left calf and left buttock continued to make good progress. Left foot is about the same to slightly improved. ooMajor problem is on the right foot. He has not had an x-ray. Deep tissue culture I did last week showed both Enterobacter and E. coli. I did not change the doxycycline I put him on empirically although neither 1 of these were plated to doxycycline. He arrives today with the erythema looking worse on both the dorsal and plantar foot. Macerated skin on the bottom of the foot. he has not been systemically unwell 5/18-Patient returns at 1 week, left calf wound appears to be making some progress, left buttock wound appears slightly worse than last time, left foot wound looks slightly better, right foot redness is marginally better. X-ray of both feet show no air or evidence  of osteomyelitis. Patient is finished his Omnicef and terbinafine. He continues to have macerated skin on the bottom of the left foot as well as right 5/26; left calf wound is better, left buttock wound appears to have multiple small superficial open areas with surrounding macerated skin. X-rays that I did last time showed no evidence of osteomyelitis in either foot. He is finished cefdinir and doxycycline. I do not think that he was on terbinafine. He continues to have a large superficial open area on the right foot anterior dorsal and slightly between the first and second toes. I did send him to dermatology 2 months ago or so wondering about whether they would do a fungal scraping. I do not believe they did but did do a culture. We have been using silver alginate to the toe areas, he has been using antifungals at home topically either ketoconazole or Lotrisone. We are using silver collagen on the left foot, silver alginate on the right, silver collagen on the left lateral leg and silver alginate on the left buttock 6/1; left buttock area is healed. We have the left dorsal foot, left lateral leg and right dorsal foot. We are using silver alginate to the areas on both feet and silver collagen to the area on his left lateral calf 6/8; the left buttock apparently reopened late last week. He is not really sure  how this happened. He is tolerating the terbinafine. Using silver alginate to all wounds 6/15; left buttock wound is larger than last week but still superficial. ooCame in the clinic today with a report of purulence from the left lateral leg I did not identify any infection ooBoth areas on his dorsal feet appear to be better. He is tolerating the terbinafine. Using silver alginate to all wounds 6/22; left buttock is about the same this week, left calf quite a bit better. His left foot is about the same however he comes in with erythema and warmth in the right forefoot once again. Culture that  I gave him in the beginning of May showed Enterobacter and E. coli. I gave him doxycycline and things seem to improve although neither 1 of these organisms was specifically plated. 6/29; left buttock is larger and dry this week. Left lateral calf looks to me to be improved. Left dorsal foot also somewhat improved right foot completely unchanged. The erythema on the right foot is still present. He is completing the Ceftin dinner that I gave him empirically [[name] discussion above.) 7/6 - All wounds look to be stable and perhaps improved, the left buttock wound is slightly smaller, per patient bleeds easily, completed ceftin, the right foot redness is less, he is on terbinafine 7/13; left buttock wound about the same perhaps slightly narrower. Area on the left lateral leg continues to narrow. Left dorsal foot slightly smaller right foot about the same. We are using silver alginate on the right foot and Hydrofera Blue to the areas on the left. Unna boot on the left 2 layer compression on the right 7/20; left buttock wound absolutely the same. Area on lateral leg continues to get better. Left dorsal foot require debridement as did the right no major change in the 7/27; left buttock wound the same size necrotic debris over the surface. The area on the lateral leg is closed once again. His left foot looks better right foot about the same although there is some involvement now of the posterior first second toe area. He is still on terbinafine which I have given him for a month, not certain a centimeter major change 06/25/19-All wounds appear to be slightly improved according to report, left buttock wound looks clean, both foot wounds have minimal to no debris the right dorsal foot has minimal slough. We are using Hydrofera Blue to the left and silver alginate to the right foot and ischial wound. 8/10-Wounds all appear to be around the same, the right forefoot distal part has some redness which was not  there before, however the wound looks clean and small. Ischial wound looks about the same with no changes 8/17; his wound on the left lateral calf which was his original chronic venous insufficiency wound remains closed. Since I last saw him the areas on the left dorsal foot right dorsal foot generally appear better but require debridement. The area on his left initial tuberosity appears somewhat larger to me perhaps hyper granulated and bleeds very easily. We have been using Hydrofera Blue to the left dorsal foot and silver alginate to everything else 8/24; left lateral calf remains closed. The areas on his dorsal feet on the webspace of the first and second toes bilaterally both look better. The area on the left buttock which is the pressure ulcer stage II slightly smaller. I change the dressing to Hydrofera Blue to all areas 8/31; left lateral calf remains closed. The area on his dorsal feet bilaterally look better.  Using Hydrofera Blue. Still requiring debridement on the left foot. No change in the left buttock pressure ulcers however 9/14; left lateral calf remains closed. Dorsal feet look quite a bit better than 2 weeks ago. Flaking dry skin also a lot better with the ammonium lactate I gave him 2 weeks ago. The area on the left buttock is improved. He states that his Roho cushion developed a leak and he is getting a new one, in the interim he is offloading this vigorously 9/21; left calf remains closed. Left heel which was a possible DTI looks better this week. He had macerated tissue around the left dorsal foot right foot looks satisfactory and improved left buttock wound. I changed his dressings to his feet to silver alginate bilaterally. Continuing Hydrofera Blue on the left buttock. 9/28 left calf remains closed. Left heel did not develop anything [possible DTI] dry flaking skin on the left dorsal foot. Right foot looks satisfactory. Improved left buttock wound. We are using silver  alginate on his feet Hydrofera Blue on the buttock. I have asked him to go back to the Lotrisone on his feet including the wounds and surrounding areas 10/5; left calf remains closed. The areas on the left and right feet about the same. A lot of this is epithelialized however debris over the remaining open areas. He is using Lotrisone and silver alginate. The area on the left buttock using Hydrofera Blue 10/26. Patient has been out for 3 weeks secondary to Covid concerns. He tested negative but I think his wife tested positive. He comes in today with the left foot substantially worse, right foot about the same. Even more concerning he states that the area on his left buttock closed over but then reopened and is considerably deeper in one aspect than it was before [stage III wound] 11/2; left foot really about the same as last week. Quarter sized wound on the dorsal foot just proximal to the first second toes. Surrounding erythema with areas of denuded epithelium. This is not really much different looking. Did not look like cellulitis this time however. ooRight foot area about the same.. We have been using silver alginate alginate on his toes ooLeft buttock still substantial irritated skin around the wound which I think looks somewhat better. We have been using Hydrofera Blue here. 11/9; left foot larger than last week and a very necrotic surface. Right foot I think is about the same perhaps slightly smaller. Debris around the circumference also addressed. Unfortunately on the left buttock there is been a decline. Satellite lesions below the major wound distally and now a an additional one posteriorly we have been using Hydrofera Blue but I think this is a pressure issue 11/16; left foot ulcer dorsally again a very adherent necrotic surface. Right foot is about the same. Not much change in the pressure ulcer on his left buttock. 11/30; left foot ulcer dorsally basically the same as when I saw  him 2 weeks ago. Very adherent fibrinous debris on the wound surface. Patient reports a lot of drainage as well. 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 We have been using Iodoflex, patient changed back to alginate because of the drainage. Area on his right dorsal foot really looks benign with a healthier surface certainly a lot better than on the left. Left buttock wounds all improved using Hydrofera Blue 12/7; left dorsal foot again no improvement. Tightly adherent debris. PCR culture I did last week only showed likely  skin contaminant. I have gone ahead and done a punch biopsy of this which is about the last thing in terms of investigations I can think to do. He has known venous insufficiency and venous hypertension and this could be the issue here. The area on the right foot is about the same left buttock slightly worse according to our intake nurse secondary to [facility] Blue sticking to the wound 12/14; biopsy of the left foot that I did last time showed changes that could be related to wound healing/chronic stasis dermatitis phenomenon no neoplasm. We have been using silver alginate to both feet. I change the one on the left today to Sorbact and silver alginate to his other 2 wounds 12/28; the patient arrives with the following problems; ooMajor issue is the dorsal left foot which continues to be a larger deeper wound area. Still with a completely nonviable surface ooParadoxically the area mirror image on the right on the right dorsal foot appears to be getting better. ooHe had some loss of dry denuded skin from the lower part of his original wound on the left lateral calf. Some of this area looked a little vulnerable and for this reason we put him in wrap that on this side this week ooThe area on his left buttock is larger. He still has the erythematous circular area which I think is a combination of pressure, sweat. This does not  look like cellulitis or fungal dermatitis 11/26/2019; -Dorsal left foot large open wound with depth. Still debris over the surface. Using Sorbact ooThe area on the dorsal right foot paradoxically has closed over [facility] has a reopening on the left ankle laterally at the base of his original wound that extended up into the calf. This appears clean. ooThe left buttock wound is smaller but with very adherent necrotic debris over the surface. We have been using silver alginate here as well The patient had arterial studies done in 2017. He had biphasic waveforms at the dorsalis pedis and posterior tibial bilaterally. ABI in the left was 1.17. Digit waveforms were dampened. He has slight spasticity in the great toes I do not think a TBI would be possible 1/11; the patient comes in today with a sizable reopening between the first and second toes on the right. This is not exactly in the same location where we have been treating wounds previously. According to our intake nurse this was actually fairly deep but 0.6 cm. The area on the left dorsal foot looks about the same the surface is somewhat cleaner using Sorbact, his MRI is in 2 days. We have not managed yet to get arterial studies. The new reopening on the left lateral calf looks somewhat better using alginate. The left buttock wound is about the same using alginate 1/18; the patient had his ARTERIAL studies which were quite normal. ABI in the right at 1.13 with triphasic/biphasic waveforms on the left ABI 1.06 again with triphasic/biphasic waveforms. It would not have been possible to have done a toe brachial index because of spasticity. We have been using Sorbac to the left foot alginate to the rest of his wounds on the right foot left lateral calf and left buttock 1/25; arrives in clinic with erythema and swelling of the left forefoot worse over the first MTP area. This extends laterally dorsally and but also posteriorly. Still has an area on the  left lateral part of the lower part of his calf wound it is eschared and clearly not closed. ooArea on the left  buttock still with surrounding irritation and erythema. ooRight foot surface wound dorsally. The area between the right and first and second toes appears better. 2/1; ooThe left foot wound is about the same. Erythema slightly better I gave him a week of doxycycline empirically ooRight foot wound is more extensive extending between the toes to the plantar surface ooLeft lateral calf really no open surface on the inferior part of his original wound however the entire area still looks vulnerable ooAbsolutely no improvement in the left buttock wound required debridement. 2/8; the left foot is about the same. Erythema is slightly improved I gave him clindamycin last week. ooRight foot looks better he is using Lotrimin and silver alginate ooHe has a breakdown in the left lateral calf. Denuded epithelium which I have removed ooLeft buttock about the same were using Hydrofera Blue 2/15; left foot is about the same there is less surrounding erythema. Surface still has tightly adherent debris which I have debriding however not making any progress ooRight foot has a substantial wound on the medial right second toe between the first and second webspace. ooStill an open area on the left lateral calf distal area. ooButtock wound is about the same 2/22; left foot is about the same less surrounding erythema. Surface has adherent debris. Polymen Ag Right foot area significant wound between the first and second toes. We have been using silver alginate here Left lateral leg polymen Ag at the base of his original venous insufficiency wound ooLeft buttock some improvement here 3/1; ooRight foot is deteriorating in the first second toe webspace. Larger and more substantial. We have been using silver alginate. ooLeft dorsal foot about the same markedly adherent surface debris using PolyMem  Ag ooLeft lateral calf surface debris using PolyMem AG ooLeft buttock is improved again using PolyMem Ag. ooHe is completing his terbinafine. The erythema in the foot seems better. He has been on this for 2 weeks 3/8; no improvement in any wound area in fact he has a small open area on the dorsal midfoot which is new this week. He has not gotten his foot x-rays yet 3/15; his x-rays were both negative for osteomyelitis of both feet. No major change in any of his wounds on the extremities however his buttock wounds are better. We have been using polymen on the buttocks, left lower leg. Iodoflex on the left foot and silver alginate on the right 3/22; arrives in clinic today with the 2 major issues are the improvement in the left dorsal foot wound which for once actually looks healthy with a nice healthy wound surface without debridement. Using Iodoflex here. Unfortunately on the left lateral calf which is in the distal part of his original wound he came to the clinic here for there was purulent drainage noted some increased breakdown scattered around the original area and a small area proximally. We we are using polymen here will change to silver alginate today. His buttock wound on the left is better and I think the area on the right first second toe webspace is also improved 3/29; left dorsal foot looks better. Using Iodoflex. Left ankle culture from deterioration last time grew E. coli, Enterobacter and Enterococcus. I will give him a course of cefdinir although that will not cover Enterococcus. The area on the right foot in the webspace of the first and second toe lateral first toe looks better. The area on his buttock is about healed Vascular appointment is on April 21. This is to look at his venous system  vis--vis continued breakdown of the wounds on the left including the left lateral leg and left dorsal foot he. He has had previous ablations on this side 4/5; the area between the right  first and second toes lateral aspect of the first toe looks better. Dorsal aspect of the left first toe on the left foot also improved. Unfortunately the left lateral lower leg is larger and there is a second satellite wound superiorly. The usual superficial abrasions on the left buttock overall better but certainly not closed Objective Constitutional Sitting or standing Blood Pressure is within target range for patient.. Pulse regular and within target range for patient.Marland Kitchen Respirations regular, non-labored and within target range.. Temperature is normal and within the target range for the patient.Marland Kitchen Appears in no distress. Vitals Time Taken: 7:43 AM, Height: 70 in, Weight: 216 lbs, BMI: 31, Temperature: 97.9 F, Pulse: 96 bpm, Respiratory Rate: 20 breaths/min, Blood Pressure: 128/44 mmHg. Cardiovascular Pedal pulses palpable on the left. Edema control looks good.. General Notes: Wound exam; ooThe area on the left foot appears to have healthy granulation. No debridement is required ooOn the lateral part of the left first toe and the first second toe webspace the area looks better. Debris over the surface of the wound but there just is no way to mechanically debride this area ooUnfortunately the area on his left lateral leg is larger although the wound itself does not require debridement. There is a new satellite area above this ooArea on his buttocks is excoriated but superficial Integumentary (Hair, Skin) No evidence of infection. Wound #24 status is Open. Original cause of wound was Gradually Appeared. The wound is located on the Left,Dorsal Foot. The wound measures 2cm length x 2.5cm width x 0.3cm depth; 3.927cm^2 area and 1.178cm^3 volume. There is Fat Layer (Subcutaneous Tissue) Exposed exposed. There is no tunneling or undermining noted. There is a medium amount of serosanguineous drainage noted. The wound margin is distinct with the outline attached to the wound base. There is large  (67-100%) red granulation within the wound bed. There is a small (1- 33%) amount of necrotic tissue within the wound bed including Adherent Slough. Wound #35 status is Open. Original cause of wound was Gradually Appeared. The wound is located on the Left Ischium. The wound measures 1cm length x 0.3cm width x 0.1cm depth; 0.236cm^2 area and 0.024cm^3 volume. There is Fat Layer (Subcutaneous Tissue) Exposed exposed. There is no tunneling or undermining noted. There is a small amount of serosanguineous drainage noted. The wound margin is flat and intact. There is large (67-100%) red granulation within the wound bed. There is no necrotic tissue within the wound bed. Wound #37 status is Open. Original cause of wound was Gradually Appeared. The wound is located on the Left,Lateral Malleolus. The wound measures 4cm length x 1.3cm width x 0.3cm depth; 4.084cm^2 area and 1.225cm^3 volume. There is Fat Layer (Subcutaneous Tissue) Exposed exposed. There is no tunneling or undermining noted. There is a medium amount of serosanguineous drainage noted. The wound margin is flat and intact. There is large (67-100%) pink, friable granulation within the wound bed. There is a small (1-33%) amount of necrotic tissue within the wound bed including Adherent Slough. Wound #38 status is Open. Original cause of wound was Gradually Appeared. The wound is located on the Right Toe - Web between 1st and 2nd. The wound measures 1cm length x 0.2cm width x 0.2cm depth; 0.157cm^2 area and 0.031cm^3 volume. There is Fat Layer (Subcutaneous Tissue) Exposed exposed. There is  no tunneling or undermining noted. There is a small amount of serosanguineous drainage noted. The wound margin is flat and intact. There is large (67-100%) red granulation within the wound bed. There is a small (1-33%) amount of necrotic tissue within the wound bed including Adherent Slough. Wound #40 status is Open. Original cause of wound was Gradually  Appeared. The wound is located on the Left,Lateral Lower Leg. The wound measures 1cm length x 0.3cm width x 0.2cm depth; 0.236cm^2 area and 0.047cm^3 volume. There is Fat Layer (Subcutaneous Tissue) Exposed exposed. There is no tunneling or undermining noted. There is a small amount of serosanguineous drainage noted. The wound margin is flat and intact. There is large (67-100%) pink granulation within the wound bed. There is a small (1-33%) amount of necrotic tissue within the wound bed including Adherent Slough. 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 There was a Four Layer Compression Therapy Procedure by Levan Hurst, RN. Post procedure Diagnosis Wound #40: Same as Pre-Procedure Plan Follow-up Appointments: Return Appointment in 1 week. Dressing Change Frequency: Wound #24 Left,Dorsal Foot: Do not change entire dressing for one week. Wound #35 Left Ischium: Change Dressing every other day. Wound #37 Left,Lateral Malleolus: Do not change entire dressing for one week. Wound #38 Right Toe - Web between 1st and 2nd: Change Dressing every other day. Wound #40 Left,Lateral Lower Leg: Do not change entire dressing for one week. Skin Barriers/Peri-Wound Care: Antifungal cream - on toes on both feet daily Barrier cream - to leg/ankle and left foot Moisturizing lotion - both legs Other: - Triamcinolone cream Primary Wound Dressing: Wound #24 Left,Dorsal Foot: Iodoflex Wound #35 Left  Ischium: Polymem Silver Wound #37 Left,Lateral Malleolus: Calcium Alginate with Silver Wound #38 Right Toe - Web between 1st and 2nd: Calcium Alginate with Silver Wound #40 Left,Lateral Lower Leg: Calcium Alginate with Silver Secondary Dressing: Wound #24 Left,Dorsal Foot: Dry Gauze Wound #35 Left Ischium: Foam Border - or ABD pad and tape Wound #37 Left,Lateral Malleolus: Dry Gauze ABD pad Wound #38 Right Toe - Web between 1st and 2nd: Kerlix/Rolled Gauze - secure with tape Dry Gauze Wound #40 Left,Lateral Lower Leg: Dry Gauze ABD pad Edema Control: 4 layer compression: Left lower extremity Elevate legs to the level of the heart or above for 30 minutes daily and/or when sitting, a frequency of: - throughout the day Support Garment 30-40 mm/Hg pressure to: - Juxtalite to right leg Off-Loading: Low air-loss mattress (Group 2) Roho cushion for wheelchair Turn and reposition every 2 hours - out of wheelchair throughout the day, try to lay on sides, sleep in the bed not the recliner 1. I have not changed any of the dressings from last week. 2. I would like to consider Apligraf on the left foot and the left lateral lower leg. We will put this through insurance 3. 4-layer compression on the left lower extremity with folded ABDs over the left lateral calf to [name] if more compression will help this area 4. He has his vascular assessment on 3/21. Predominantly here I am looking for  an assessment of his venous system on the left. He has had previous ablations here. 5. On the buttock he has dry excoriated skin today. On some occasions this is look too wet today it looks to dry. I have not changed the primary dressing here which is polymen Electronic Signature(s) Signed: 02/25/2020 5:24:04 PM By: Linton Ham MD Entered By: Linton Ham on 02/25/2020 08:39:20 -------------------------------------------------------------------------------- SuperBill Details Patient Name: Date of  Service: Rotert, [name] 02/25/2020 Medical Record [id number] Patient Account Number: [id number] Date of Birth/Sex: Treating RN: 06/18/1986 (34 y.o. [name] Primary Care Provider: Crisp, Bucoda Other Clinician: Referring Provider: Treating Provider/Extender:Daylah Sayavong, [name], [name]: 216 Diagnosis Coding ICD-10 Codes Code Description L97.521 Non-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 L97.511 Non-pressure chronic ulcer of other part of right foot limited to breakdown of skin 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, complete 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, stage 3 L97.321 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 to breakdown of skin L03.116 Cellulitis of left lower limb Facility Procedures CPT4 Code Description: [id number] (Facility Use Only) [phone] - APPLY MULTLAY COMPRS LWR LT LEG Modifier: Quantity: 1 Physician Procedures CPT4 Code Description: [id number] - WC PHYS LEVEL 3 - EST PT ICD-10 Diagnosis Description L97.521 Non-pressure chronic ulcer of other part of left foot li L97.511 Non-pressure chronic ulcer of other part of right foot l L89.323 Pressure ulcer of left  buttock, stage 3 L97.321 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 to brea Modifier: mited to breakdow imited to breakdo kdown of skin Quantity: 1 n of skin wn of skin Electronic Signature(s) Signed: 02/25/2020 5:24:04 PM By: Linton Ham MD Entered By: Linton Ham on 02/25/2020 08:39:46

## 2020-02-27 NOTE — Progress Notes (Signed)
[name], KALEAB [name] ([id number]) Visit Report for 02/25/2020 Arrival Information Details Patient Name: Date of Service: [name], [name] 02/25/2020 7:30 AM Medical Record [id number] Patient Account Number: [id number] Date of Birth/Sex: Treating RN: 22-Dec-1985 (34 y.o. [name]) Carlene Coria Primary Care Jadalyn Oliveri: [name], [name] Other Clinician: Referring Emilene Roma: Treating Slater Mcmanaman/Extender:Robson, [name], [name]: 216 Visit Information History Since Last Visit All ordered tests and consults were completed: No Patient Arrived: Wheel Chair Added or deleted any medications: No Arrival Time: 07:42 Any new allergies or adverse reactions: No Accompanied By: self Had a fall or experienced change in No activities of daily living that may affect Transfer Assistance: None risk of falls: Patient Identification Verified: Yes Signs or symptoms of abuse/neglect since last No Secondary Verification Process Completed: Yes visito Patient Requires Transmission-Based No Hospitalized since last visit: No Precautions: Implantable device outside of the clinic excluding No Patient Has Alerts: Yes cellular tissue based products placed in the center Patient Alerts: R ABI = since last visit: 1.0 Has Dressing in Place as Prescribed: Yes L ABI = Has Compression in Place as Prescribed: Yes 1.1 Pain Present Now: No Electronic Signature(s) Signed: 02/26/2020 6:43:44 PM By: Carlene Coria RN Entered By: Carlene Coria on 02/25/2020 07:43:14 -------------------------------------------------------------------------------- Compression Therapy Details Patient Name: Date of Service: CRIBBBraylynn, [name] 02/25/2020 7:30 AM Medical Record [id number] Patient Account Number: [id number] Date of Birth/Sex: Treating RN: October 31, 1986 (34 y.o. [name] Primary Care Julliette Frentz: [place], [place] Other Clinician: Referring Kassie Keng: Treating Jayd Forrey/Extender:Robson, [name], [name] Weeks in  Treatment: 216 Compression Therapy Performed for Wound Wound #40 Left,Lateral Lower Leg Assessment: Performed By: Clinician Levan Hurst, RN Compression Type: Four Layer Post Procedure Diagnosis Same as Pre-procedure Electronic Signature(s) Signed: 02/25/2020 5:09:30 PM By: Levan Hurst RN, BSN Entered By: Levan Hurst on 02/25/2020 08:17:49 -------------------------------------------------------------------------------- Encounter Discharge Information Details Patient Name: Date of Service: [name], [name] 02/25/2020 7:30 AM Medical Record [id number] Patient Account Number: [id number] Date of Birth/Sex: Treating RN: 11/01/1986 (34 y.o. [name] Primary Care Charlsie Fleeger: [place], [place] Other Clinician: Referring Allaya Abbasi: Treating Jams Trickett/Extender:Robson, [name], [name]: 216 Encounter Discharge Information Items Discharge Condition: Stable Ambulatory Status: Wheelchair Discharge Destination: Home Transportation: Other Accompanied By: self Schedule Follow-up Appointment: Yes Clinical Summary of Care: Patient Declined Electronic Signature(s) Signed: 02/25/2020 4:55:48 PM By: Kela Millin Entered By: Kela Millin on 02/25/2020 08:32:26 -------------------------------------------------------------------------------- Lower Extremity Assessment Details Patient Name: Date of Service: [name], [name] 02/25/2020 7:30 AM Medical Record [id number] Patient Account Number: [id number] Date of Birth/Sex: Treating RN: 1986-01-31 (34 y.o. [name] Primary Care Sindhu Nguyen: [place], [place] Other Clinician: Referring Marianita Botkin: Treating Beronica Lansdale/Extender:Robson, [name], [name]: 216 Edema Assessment Assessed: [Left: No] [Right: No] Edema: [Left: Yes] [Right: Yes] Calf Left: Right: Point of Measurement: 33 cm From Medial Instep 34 cm 34 cm Ankle Left: Right: Point of Measurement: 10 cm From Medial Instep 23 cm  23 cm Electronic Signature(s) Signed: 02/26/2020 6:43:44 PM By: Carlene Coria RN Entered By: Carlene Coria on 02/25/2020 08:03:47 -------------------------------------------------------------------------------- Multi Wound Chart Details Patient Name: Date of Service: [name] 02/25/2020 7:30 AM Medical Record [id number] Patient Account Number: [id number] Date of Birth/Sex: Treating RN: Nov 19, 1986 (34 y.o. [name] Primary Care Maebel Marasco: [name], [name] Other Clinician: Referring Alexandr Yaworski: Treating Michael Ventresca/Extender:Robson, [name], [name]: 216 Vital Signs Height(in): 70 Pulse(bpm): 96 Weight(lbs): 216 Blood Pressure(mmHg): 128/44 Body Mass Index(BMI): 31 Temperature(F): 97.9 Respiratory 20 Rate(breaths/min): Photos: [24:No Photos] [35:No Photos] [37:No Photos] Wound Location: [24:Left, Dorsal  Foot] [35:Left Ischium] [37:Left, Lateral Malleolus] Wounding Event: [24:Gradually Appeared] [35:Gradually Appeared] [37:Gradually Appeared] Primary Etiology: [24:Inflammatory] [35:Pressure Ulcer] [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] Secondary Etiology: [24:N/A] [35:N/A] [37:N/A] Comorbid History: [24:Sleep Apnea, Hypertension, Sleep Apnea, Hypertension, Sleep Apnea, Hypertension, Paraplegia] [35:Paraplegia] [37:Paraplegia] Date Acquired: [24:03/08/2018] [35:04/28/2019] [37:11/26/2019] Weeks of Treatment: [24:102] [35:43] [37:13] Wound Status: [24:Open] [35:Open] [37:Open] Clustered Wound: [24:Yes] [35:Yes] [37:No] Clustered Quantity: [24:1] [35:2] [37:N/A] Measurements L x W x D 2x2.5x0.3 [35:1x0.3x0.1] [37:4x1.3x0.3] (cm) Area (cm) : [24:3.927] [35:0.236] [37:4.084] Volume (cm) : [24:1.178] [35:0.024] [37:1.225] % Reduction in Area: [24:-1564.00%] [35:98.30%] [37:-3.20%] % Reduction in Volume: -4808.30% [35:98.30%] [37:-209.30%] Classification: [24:Full Thickness Without Exposed Support Structures] [35:Category/Stage III] [37:Full Thickness Without Exposed  Support Structures] Exudate Amount: [24:Medium] [35:Small] [37:Medium] Exudate Type: [24:Serosanguineous] [35:Serosanguineous] [37:Serosanguineous] Exudate Color: [24:red, brown] [35:red, brown] [37:red, brown] Wound Margin: [24:Distinct, outline attached] [35:Flat and Intact] [37:Flat and Intact] Granulation Amount: [24:Large (67-100%)] [35:Large (67-100%)] [37:Large (67-100%)] Granulation Quality: [24:Red] [35:Red] [37:Pink, Friable] Necrotic Amount: [24:Small (1-33%)] [35:None Present (0%)] [37:Small (1-33%)] Exposed Structures: [24:Fat Layer (Subcutaneous Tissue) Exposed: Yes Fascia: No Tendon: No Muscle: No Joint: No Bone: No] [35:Fat Layer (Subcutaneous Tissue) Exposed: Yes Fascia: No Tendon: No Muscle: No Joint: No Bone: No] [37:Fat Layer (Subcutaneous Tissue) Exposed: Yes  Fascia: No Tendon: No Muscle: No Joint: No Bone: No] Epithelialization: [24:Small (1-33%)] [35:Large (67-100%)] [37:Small (1-33%)] Procedures Performed: [24:N/A 38] [35:N/A 40] [37:N/A] Photos: [24:No Photos] [35:No Photos] [37:N/A] Wound Location: [24:Right Toe - Web between Left, Lateral Lower Leg 1st and 2nd] [37:N/A] Wounding Event: [24:Gradually Appeared] [35:Gradually Appeared] [37:N/A] Primary Etiology: [24:Inflammatory] [35:Venous Leg Ulcer] [37:N/A] Secondary Etiology: [24:N/A] [35:Lymphedema] [37:N/A] Comorbid History: [24:Sleep Apnea, Hypertension, Sleep Apnea, Hypertension, N/A Paraplegia] [35:Paraplegia] Date Acquired: [24:11/30/2019] [35:02/11/2020] [37:N/A] Weeks of Treatment: [24:12] [35:2] [37:N/A] Wound Status: [24:Open] [35:Open] [37:N/A] Clustered Wound: [24:No] [35:No] [37:N/A] Clustered Quantity: [24:N/A] [35:N/A] [37:N/A] Measurements L x W x D 1x0.2x0.2 [35:1x0.3x0.2] [37:N/A] (cm) Area (cm) : [24:0.157] [35:0.236] [37:N/A] Volume (cm) : [24:0.031] [35:0.047] [37:N/A] % Reduction in Area: [24:52.40%] [35:39.90%] [37:N/A] % Reduction in Volume: 86.60% [35:-20.50%] [37:N/A] Classification:  [24:Full Thickness Without Exposed Support Structures Exposed Support Structures] [35:Full Thickness Without] [37:N/A] Exudate Amount: [24:Small] [35:Small] [37:N/A] Exudate Type: [24:Serosanguineous] [35:Serosanguineous] [37:N/A] Exudate Color: [24:red, brown] [35:red, brown] [37:N/A] Wound Margin: [24:Flat and Intact] [35:Flat and Intact] [37:N/A] Granulation Amount: [24:Large (67-100%)] [35:Large (67-100%)] [37:N/A] Granulation Quality: [24:Red] [35:Pink] [37:N/A] Necrotic Amount: [24:Small (1-33%)] [35:Small (1-33%)] [37:N/A] Exposed Structures: [24:Fat Layer (Subcutaneous Fat Layer (Subcutaneous N/A Tissue) Exposed: Yes Fascia: No Tendon: No Muscle: No Joint: No Bone: No] [35:Tissue) Exposed: Yes Fascia: No Tendon: No Muscle: No Joint: No Bone: No] Epithelialization: [24:Large (67-100%)] [35:Small (1-33%) Compression Therapy] [37:N/A N/A] Treatment Notes Wound #24 (Left, Dorsal Foot) 1. Cleanse With Wound Cleanser Soap and water 2. Periwound Care Antifungal cream TCA Cream 3. Primary Dressing Applied Calcium Alginate Ag Iodoflex 4. Secondary Dressing ABD Pad Dry Gauze 6. Support Layer Applied 4 layer compression wrap Notes iodoflex to dorsal foot and silver alginate to other sites Wound #35 (Left Ischium) 1. Cleanse With Wound Cleanser 3. Primary Dressing Applied Polymem Ag 4. Secondary Dressing Foam Border Dressing Wound #37 (Left, Lateral Malleolus) 1. Cleanse With Wound Cleanser Soap and water 2. Periwound Care Antifungal cream TCA Cream 3. Primary Dressing Applied Calcium Alginate Ag Iodoflex 4. Secondary Dressing ABD Pad Dry Gauze 6. Support Layer Applied 4 layer compression wrap Notes iodoflex to dorsal foot and silver alginate to other sites Wound #38 (Right Toe - Web between 1st and 2nd) 1. Cleanse With Wound Cleanser 2.  Periwound Care Antifungal cream 3. Primary Dressing Applied Calcium Alginate Ag 4. Secondary Dressing Dry Gauze Roll  Gauze 5. Secured With Tape Notes juxtalite Wound #40 (Left, Lateral Lower Leg) 1. Cleanse With Wound Cleanser Soap and water 2. Periwound Care Antifungal cream TCA Cream 3. Primary Dressing Applied Calcium Alginate Ag Iodoflex 4. Secondary Dressing ABD Pad Dry Gauze 6. Support Layer Applied 4 layer compression wrap Notes iodoflex to dorsal foot and silver alginate to other sites Electronic Signature(s) Signed: 02/25/2020 5:09:30 PM By: Levan Hurst RN, BSN Signed: 02/25/2020 5:24:04 PM By: Linton Ham MD Entered By: Linton Ham on 02/25/2020 08:32:06 -------------------------------------------------------------------------------- Multi-Disciplinary Care Plan Details Patient Name: Date of Service: CRIBBKaelan, [name] 02/25/2020 7:30 AM Medical Record [id number] Patient Account Number: [id number] Date of Birth/Sex: Treating RN: 01-Oct-1986 (34 y.o. [name] Primary Care Kanyia Heaslip: [name], [name] Other Clinician: Referring Euell Schiff: Treating Emaline Karnes/Extender:Robson, [name], [name]: 216 Active Inactive Wound/Skin Impairment Nursing Diagnoses: Impaired tissue integrity Knowledge deficit related to ulceration/compromised skin integrity Goals: Patient/caregiver will verbalize understanding of skin care regimen Date Initiated: 01/05/2016 Target Resolution Date: 03/14/2020 Goal Status: Active Ulcer/skin breakdown will have a volume reduction of 30% by week 4 Date Initiated: 01/05/2016 Date Inactivated: 12/22/2017 Target Resolution Date: 01/19/2018 Unmet Reason: complex Goal Status: Unmet wounds, infection Interventions: Assess patient/caregiver ability to obtain necessary supplies Assess ulceration(s) every visit Provide education on ulcer and skin care Notes: Electronic Signature(s) Signed: 02/25/2020 5:09:30 PM By: Levan Hurst RN, BSN Entered By: Levan Hurst on 02/25/2020  08:00:00 -------------------------------------------------------------------------------- Pain Assessment Details Patient Name: Date of Service: Steinhauser, [name] 02/25/2020 7:30 AM Medical Record [id number] Patient Account Number: [id number] Date of Birth/Sex: Treating RN: 10-22-1986 (34 y.o. [name] Primary Care Amyrie Illingworth: [place], [place] Other Clinician: Referring Felesia Stahlecker: Treating Petrea Fredenburg/Extender:Robson, [name], [name]: 216 Active Problems Location of Pain Severity and Description of Pain Patient Has Paino No Site Locations Pain Management and Medication Current Pain Management: Electronic Signature(s) Signed: 02/26/2020 6:43:44 PM By: Carlene Coria RN Entered By: Carlene Coria on 02/25/2020 07:44:04 -------------------------------------------------------------------------------- Patient/Caregiver Education Details Patient Name: Date of Service: [name] 4/5/2021andnbsp7:30 AM Medical Record [id number] Patient Account Number: [id number] Date of Birth/Gender: [birth year] (33 y.o. M) Treating RN: Levan Hurst Primary Care Physician: Janine Limbo Other Clinician: Referring Physician: Treating Physician/Extender:Robson, Pecola Leisure in Treatment: 21 Education Assessment Education Provided To: Patient Education Topics Provided Wound/Skin Impairment: Methods: Explain/Verbal Responses: State content correctly Electronic Signature(s) Signed: 02/25/2020 5:09:30 PM By: Levan Hurst RN, BSN Entered By: Levan Hurst on 02/25/2020 08:00:42 -------------------------------------------------------------------------------- Wound Assessment Details Patient Name: Date of Service: [name], [name] 02/25/2020 7:30 AM Medical Record [id number] Patient Account Number: [id number] Date of Birth/Sex: Treating RN: 26-Aug-1986 (34 y.o. [name] Primary Care Jeraldean Wechter: [place], [place] Other Clinician: Referring Friend Dorfman:  Treating Carmelia Tiner/Extender:Robson, [name], [name]: 216 Wound Status Wound Number: 24 Primary Etiology: Inflammatory Wound Location: Left, Dorsal Foot Wound Status: Open Wounding Event: Gradually Appeared Comorbid Sleep Apnea, Hypertension, History: Paraplegia Date Acquired: 03/08/2018 Weeks Of Treatment: 102 Clustered Wound: Yes Wound Measurements Length: (cm) 2 % Reduct Width: (cm) 2.5 % Reduct Depth: (cm) 0.3 Epitheli Clustered Quantity: 1 Tunnelin Area: (cm) 3.927 Undermi Volume: (cm) 1.178 Wound Description Full Thickness Without Exposed Support Foul Od Classification: Structures Slough/ Wound Distinct, outline attached Margin: Exudate Medium Amount: Exudate Serosanguineous Type: Exudate red, brown Color: Wound Bed Granulation Amount: Large (67-100%) Granulation Quality: Red Fasc Necrotic Amount: Small (1-33%) Fat Necrotic Quality: Adherent Slough Tend  Musc Join Bone or After Cleansing: No Fibrino Yes Exposed Structure ia Exposed: No Layer (Subcutaneous Tissue) Exposed: Yes on Exposed: No le Exposed: No t Exposed: No Exposed: No ion in Area: -1564% ion in Volume: -4808.3% alization: Small (1-33%) g: No ning: No Treatment Notes Wound #24 (Left, Dorsal Foot) 1. Cleanse With Wound Cleanser Soap and water 2. Periwound Care Antifungal cream TCA Cream 3. Primary Dressing Applied Calcium Alginate Ag Iodoflex 4. Secondary Dressing ABD Pad Dry Gauze 6. Support Layer Applied 4 layer compression wrap Notes iodoflex to dorsal foot and silver alginate to other sites Electronic Signature(s) Signed: 02/26/2020 6:43:44 PM By: Carlene Coria RN Entered By: Carlene Coria on 02/25/2020 08:06:38 -------------------------------------------------------------------------------- Wound Assessment Details Patient Name: Date of Service: CRIBBAmardeep, [name] 02/25/2020 7:30 AM Medical Record [id number] Patient Account Number:  [id number] Date of Birth/Sex: Treating RN: 11-18-1986 (34 y.o. [name] Primary Care Abu Heavin: [place], [place] Other Clinician: Referring Braelyn Jenson: Treating Catlyn Shipton/Extender:Robson, [name], [name]: 216 Wound Status Wound Number: 35 Primary Etiology: Pressure Ulcer Wound Location: Left Ischium Wound Status: Open Wounding Event: Gradually Appeared Comorbid Sleep Apnea, Hypertension, History: Paraplegia Date Acquired: 04/28/2019 Weeks Of Treatment: 43 Clustered Wound: Yes Wound Measurements Length: (cm) 1 Width: (cm) 0.3 Depth: (cm) 0.1 Clustered Quantity: 2 Area: (cm) 0.236 Volume: (cm) 0.024 Wound Description Classification: Category/Stage III Wound Margin: Flat and Intact Exudate Amount: Small Exudate Type: Serosanguineous Exudate Color: red, brown Wound Bed Granulation Amount: Large (67-100%) Granulation Quality: Red Necrotic Amount: None Present (0%) After Cleansing: No brino Yes Exposed Structure osed: No (Subcutaneous Tissue) Exposed: Yes osed: No osed: No sed: No ed: No % Reduction in Area: 98.3% % Reduction in Volume: 98.3% Epithelialization: Large (67-100%) Tunneling: No Undermining: No Foul Odor Slough/Fi Fascia Exp Fat Layer Tendon Exp Muscle Exp Joint Expo Bone Expos Treatment Notes Wound #35 (Left Ischium) 1. Cleanse With Wound Cleanser 3. Primary Dressing Applied Polymem Ag 4. Secondary Dressing Foam Border Dressing Electronic Signature(s) Signed: 02/26/2020 6:43:44 PM By: Carlene Coria RN Entered By: Carlene Coria on 02/25/2020 08:06:55 -------------------------------------------------------------------------------- Wound Assessment Details Patient Name: Date of Service: CRIBBJettie, [name] 02/25/2020 7:30 AM Medical Record [id number] Patient Account Number: [id number] Date of Birth/Sex: Treating RN: September 10, 1986 (34 y.o. [name]) Carlene Coria Primary Care Daren Doswell: [place], [place] Other Clinician: Referring  Sadey Yandell: Treating Marney Treloar/Extender:Robson, [name], [name]: 216 Wound Status Wound Number: 37 Primary Etiology: Venous Leg Ulcer Wound Location: Left, Lateral Malleolus Wound Status: Open Wounding Event: Gradually Appeared Comorbid Sleep Apnea, Hypertension, History: Paraplegia Date Acquired: 11/26/2019 Weeks Of Treatment: 13 Clustered Wound: No Wound Measurements Length: (cm) 4 % Re Width: (cm) 1.3 % Re Depth: (cm) 0.3 Epit Area: (cm) 4.084 Tun Volume: (cm) 1.225 Und Wound Description Full Thickness Without Exposed Support Classification: Structures Wound Flat and Intact Margin: Exudate Medium Amount: Exudate Serosanguineous Type: Exudate red, brown Color: Wound Bed Granulation Amount: Large (67-100%) Granulation Quality: Pink, Friable Necrotic Amount: Small (1-33%) Necrotic Quality: Adherent Slough Foul Odor After Cleansing: No Slough/Fibrino Yes Exposed Structure Fascia Exposed: No Fat Layer (Subcutaneous Tissue) Exposed: Yes Tendon Exposed: No Muscle Exposed: No Joint Exposed: No Bone Exposed: No duction in Area: -3.2% duction in Volume: -209.3% helialization: Small (1-33%) neling: No ermining: No Treatment Notes Wound #37 (Left, Lateral Malleolus) 1. Cleanse With Wound Cleanser Soap and water 2. Periwound Care Antifungal cream TCA Cream 3. Primary Dressing Applied Calcium Alginate Ag Iodoflex 4. Secondary Dressing ABD Pad Dry Gauze 6. Support Layer Applied 4 layer compression wrap Notes  iodoflex to dorsal foot and silver alginate to other sites Electronic Signature(s) Signed: 02/26/2020 6:43:44 PM By: Carlene Coria RN Entered By: Carlene Coria on 02/25/2020 08:07:20 -------------------------------------------------------------------------------- Wound Assessment Details Patient Name: Date of Service: CRIBBLarenzo, [name] 02/25/2020 7:30 AM Medical Record [id number] Patient Account Number: [id number] Date of  Birth/Sex: Treating RN: [birth year] (33 y.o. [name] Primary Care Ananth Fiallos: [place], [place] Other Clinician: Referring Donnis Phaneuf: Treating Melony Tenpas/Extender:Robson, [name], [name]: 216 Wound Status Wound Number: 38 Primary Etiology: Inflammatory Wound Location: Right Toe - Web between 1st and Wound Status: Open 2nd Comorbid Sleep Apnea, Hypertension, Wounding Event: Gradually Appeared History: Paraplegia Date Acquired: 11/30/2019 Weeks Of Treatment: 12 Clustered Wound: No Wound Measurements Length: (cm) 1 % Reduct Width: (cm) 0.2 % Reduct Depth: (cm) 0.2 Epitheli Area: (cm) 0.157 Tunneli Volume: (cm) 0.031 Undermi Wound Description Classification: Full Thickness Without Exposed Support Foul Odo Structures Slough/F Wound Flat and Intact Margin: Exudate Small Amount: Exudate Serosanguineous Type: Exudate red, brown Color: Wound Bed Granulation Amount: Large (67-100%) Granulation Quality: Red Fascia E Necrotic Amount: Small (1-33%) Fat Laye Necrotic Quality: Adherent Slough Tendon E Muscle E Joint Ex Bone Exp r After Cleansing: No ibrino Yes Exposed Structure xposed: No r (Subcutaneous Tissue) Exposed: Yes xposed: No xposed: No posed: No osed: No ion in Area: 52.4% ion in Volume: 86.6% alization: Large (67-100%) ng: No ning: No Treatment Notes Wound #38 (Right Toe - Web between 1st and 2nd) 1. Cleanse With Wound Cleanser 2. Periwound Care Antifungal cream 3. Primary Dressing Applied Calcium Alginate Ag 4. Secondary Dressing Dry Gauze Roll Gauze 5. Secured With Tape Notes Development worker, international aid) Signed: 02/26/2020 6:43:44 PM By: Carlene Coria RN Entered By: Carlene Coria on 02/25/2020 08:07:44 -------------------------------------------------------------------------------- Wound Assessment Details Patient Name: Date of Service: CRIBBDarsh, [name] 02/25/2020 7:30 AM Medical Record [id number] Patient  Account Number: [id number] Date of Birth/Sex: Treating RN: 05-03-1986 (34 y.o. [name]) Carlene Coria Primary Care Dejana Pugsley: [name], [name] Other Clinician: Referring Jennah Satchell: Treating Jovonna Nickell/Extender:Robson, [name], [name]: 216 Wound Status Wound Number: 40 Primary Etiology: Venous Leg Ulcer Wound Location: Left, Lateral Lower Leg Secondary Lymphedema Etiology: Wounding Event: Gradually Appeared Wound Status: Open Date Acquired: 02/11/2020 Comorbid History: Sleep Apnea, Hypertension, Weeks Of Treatment: 2 Paraplegia Clustered Wound: No Wound Measurements Length: (cm) 1 % Reduct Width: (cm) 0.3 % Reduct Depth: (cm) 0.2 Epitheli Area: (cm) 0.236 Tunneli Volume: (cm) 0.047 Undermi Wound Description Classification: Full Thickness Without Exposed Support Foul Odo Structures Slough/F Wound Flat and Intact Margin: Exudate Small Amount: Exudate Serosanguineous Type: Exudate red, brown Color: Wound Bed Granulation Amount: Large (67-100%) Granulation Quality: Pink Fasc Necrotic Amount: Small (1-33%) Fat Necrotic Quality: Adherent Slough Tend Musc Join Bone r After Cleansing: No ibrino Yes Exposed Structure ia Exposed: No Layer (Subcutaneous Tissue) Exposed: Yes on Exposed: No le Exposed: No t Exposed: No Exposed: No ion in Area: 39.9% ion in Volume: -20.5% alization: Small (1-33%) ng: No ning: No Treatment Notes Wound #40 (Left, Lateral Lower Leg) 1. Cleanse With Wound Cleanser Soap and water 2. Periwound Care Antifungal cream TCA Cream 3. Primary Dressing Applied Calcium Alginate Ag Iodoflex 4. Secondary Dressing ABD Pad Dry Gauze 6. Support Layer Applied 4 layer compression wrap Notes iodoflex to dorsal foot and silver alginate to other sites Electronic Signature(s) Signed: 02/26/2020 6:43:44 PM By: Carlene Coria RN Entered By: Carlene Coria on 02/25/2020  08:08:10 -------------------------------------------------------------------------------- Vitals Details Patient Name: Date of Service: [name], [name] 02/25/2020 7:30 AM Medical Record [id number] Patient  Account Number: [id number] Date of Birth/Sex: Treating RN: 1986/05/19 (34 y.o. [name]) Carlene Coria Primary Care Albino Bufford: [place], [place] Other Clinician: Referring Danna Sewell: Treating Linsie Lupo/Extender:Robson, [name], [name]: 216 Vital Signs Time Taken: 07:43 Temperature (F): 97.9 Height (in): 70 Pulse (bpm): 96 Weight (lbs): 216 Respiratory Rate (breaths/min): 20 Body Mass Index (BMI): 31 Blood Pressure (mmHg): 128/44 Reference Range: 80 - 120 mg / dl Electronic Signature(s) Signed: 02/26/2020 6:43:44 PM By: Carlene Coria RN Entered By: Carlene Coria on 02/25/2020 07:43:56

## 2020-03-03 ENCOUNTER — Encounter (HOSPITAL_BASED_OUTPATIENT_CLINIC_OR_DEPARTMENT_OTHER): Payer: BC Managed Care – PPO | Admitting: Internal Medicine

## 2020-03-03 ENCOUNTER — Other Ambulatory Visit: Payer: Self-pay

## 2020-03-03 DIAGNOSIS — Z87891 Personal history of nicotine dependence: Secondary | ICD-10-CM | POA: Diagnosis not present

## 2020-03-03 DIAGNOSIS — G8221 Paraplegia, complete: Secondary | ICD-10-CM | POA: Diagnosis not present

## 2020-03-03 DIAGNOSIS — L97521 Non-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: Secondary | ICD-10-CM | POA: Diagnosis not present

## 2020-03-03 DIAGNOSIS — Z89422 Acquired absence of other left toe(s): Secondary | ICD-10-CM | POA: Diagnosis not present

## 2020-03-03 DIAGNOSIS — L97511 Non-pressure chronic ulcer of other part of right foot limited to breakdown of skin: Secondary | ICD-10-CM | POA: Diagnosis not present

## 2020-03-03 DIAGNOSIS — L89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, stage 3: Secondary | ICD-10-CM | POA: Diagnosis not present

## 2020-03-03 DIAGNOSIS — L03116 Cellulitis of left lower limb: Secondary | ICD-10-CM | POA: Diagnosis not present

## 2020-03-03 DIAGNOSIS — I87332 Chronic venous hypertension (idiopathic) with ulcer and inflammation of left lower extremity: Secondary | ICD-10-CM | POA: Diagnosis not present

## 2020-03-03 DIAGNOSIS — L97321 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 to breakdown of skin: Secondary | ICD-10-CM | POA: Diagnosis not present

## 2020-03-04 NOTE — Progress Notes (Signed)
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 chronic fungal infections in treated him aggressively for this although we have not made any improvement here. 08/11/17; left lateral leg. Surface continues to improve with the Iodoflex although we are not seeing much improvement in overall wound dimensions. Areas on his plantar left foot and right foot show no improvement. In fact the right foot looks somewhat worse 08/18/17; left lateral leg. We changed to [facility] Blue last week after a prolonged course of Iodoflex which helps get the surface better. It appears that the wound with is improved. Continue with difficult areas on the left dorsal first second and plantar first second on the right 09/01/17; patient arrives in clinic today  having had a temperature of 103 yesterday. He was seen in the ER and [facility]. The patient was concerned he could have cellulitis again in the right leg however they diagnosed him with a UTI and he is now on Keflex. He has a history of cellulitis which is been recurrent and difficult but this is been in the left leg, in the past 5 use doxycycline. He does in and out catheterizations at home which are risk factors for UTI 09/08/17; patient will be completing his Keflex this weekend. The erythema on the left leg is  considerably better. He has a new wound today on the medial part of the right leg small superficial almost looks like a skin tear. He has worsening of the area on the right dorsal first and second toe. His major area on the left lateral leg is better. Using Hydrofera Blue on all areas 09/15/17; gradual reduction in width on the long wound in the left lateral leg. No debridement required. He also has wounds on the plantar aspect of his left first second toe web space and on the dorsal aspect of the right first second toe web space. 09/22/17; there continues to be very gradual improvements in the dimensions of the left lateral leg wound. He hasn't round erythematous spot with might be pressure on his wheelchair. There is no evidence obviously of infection no purulence no warmth He has a dry scaled area on the plantar aspect of the left first second toe Improved area on the dorsal right first second toe. 09/29/17; left lateral leg wound continues to improve in dimensions mostly with an is still a fairly long but increasingly narrow wound. He has a dry scaled area on the plantar aspect of his left first second toe web space Increasingly concerning area on the dorsal right first second toe. In fact I am concerned today about possible cellulitis around this wound. The areas extending up his second toe and although there is deformities here almost appears to abut on the nailbed. 10/06/17; left lateral leg wound continues to make very gradual progress. Tissue culture I did from the right first second toe dorsal foot last time grew MRSA and enterococcus which was vancomycin sensitive. This was not sensitive to clindamycin or doxycycline. He is allergic to Zyvox and sulfa we have therefore arrange for him to have dalvance infusion tomorrow. He is had this in the past and tolerated it well 10/20/17; left lateral leg wound continues to make decent progress. This is certainly reduced in terms of with  there is advancing epithelialization.The cellulitis in the right foot looks better although he still has a deep wound in the dorsal aspect of the first second toe web space. Plantar left first toe web space on the left I think is making some progress 10/27/17; left lateral leg wound continues to make decent progress. Advancing epithelialization.using Hydrofera Blue The right first second toe web space wound is better-looking using silver alginate Improvement in the left plantar first second toe web space. Again using silver alginate 11/03/17 left lateral leg wound continues to make decent progress albeit slowly. Using [facility] The right per second toe web space continues to be a very problematic looking punched out wound. I obtained a piece of tissue for deep culture I did extensively treated this for fungus. It is difficult to imagine that this is a pressure area as the patient states other than going outside he doesn't really wear shoes at home The left plantar first second toe web  space looked fairly senescent. Necrotic edges. This required debridement change to [facility] Blue to all wound areas 11/10/17; left lateral leg wound continues to contract. Using Hydrofera Blue On the right dorsal first second toe web space dorsally. Culture I did of this area last week grew MRSA there is not an easy oral option in this patient was multiple antibiotic allergies or intolerances. This was only a rare culture isolate I'm therefore going to use Bactroban under silver alginate On the left plantar first second toe web space. Debridement is required here. This is also unchanged 11/17/17; left lateral leg wound continues to contract using Hydrofera Blue this is no longer the major issue. The major concern here is the right first second toe web space. He now has an open area going from dorsally to the plantar aspect. There is now wound on the inner lateral part of the first toe. Not a very viable surface  on this. There is erythema spreading medially into the forefoot. No major change in the left first second toe plantar wound 11/24/17; left lateral leg wound continues to contract using Hydrofera Blue. Nice improvement today The right first second toe web space all of this looks a lot less angry than last week. I have given him clindamycin and topical Bactroban for MRSA and terbinafine for the possibility of underlining tinea pedis that I could not control with ketoconazole. Looks somewhat better The area on the plantar left first second toe web space is weeping with dried debris around the wound 12/01/17; left lateral leg wound continues to contract he Hydrofera Blue. It is becoming thinner in terms of with nevertheless it is making good improvement. The right first second toe web space looks less angry but still a large necrotic-looking wounds starting on the plantar aspect of the right foot extending between the toes and now extensively on the base of the right second toe. I gave him clindamycin and topical Bactroban for MRSA anterior benefiting for the possibility of underlying tinea pedis. Not looking better today The area on the left first/second toe looks better. Debrided of necrotic debris 12/05/17* the patient was worked in urgently today because over the weekend he found blood on his incontinence bad when he woke up. He was found to have an ulcer by his wife who does most of his wound care. He came in today for [place] to look at this. He has not had a history of wounds in his buttocks in spite of his paraplegia. 12/08/17; seen in follow-up today at his usual appointment. He was seen earlier this week and found to have a new wound on his buttock. We also follow him for wounds on the left lateral leg, left first second toe web space and right first second toe web space 12/15/17; we have been using Hydrofera Blue to the left lateral leg which has improved. The right first second toe web space has  also improved. Left first second toe web space plantar aspect looks stable. The left buttock has worsened using Santyl. Apparently the buttock has drainage 12/22/17; we have been using Hydrofera Blue to the left lateral leg which continues to improve now 2 small wounds separated by normal skin. He tells [place] he had a fever up to 100 yesterday he is prone to UTIs but has not noted anything different. He does in and out catheterizations. The area between the first and second toes today does not look good necrotic surface covered with what looks to be purulent drainage and erythema  extending into the third toe. I had gotten this to something that I thought look better last time however it is not look good today. He also has a necrotic surface over the buttock wound which is expanded. I thought there might be infection under here so I removed a lot of the surface with a #5 curet though nothing look like it really needed culturing. He is been using Santyl to this area 12/27/17; his original wound on the left lateral leg continues to improve using Hydrofera Blue. I gave him samples of Baxdella although he was unable to take them out of fear for an allergic reaction ["lump in his throat"].the culture I did of the purulent drainage from his second toe last week showed both enterococcus and a set Enterobacter I was also concerned about the erythema on the bottom of his foot although paradoxically although this looks somewhat better today. 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 We've been using silver alginate here. They came up today that he sleeps in a recliner, I'm not sure why but I asked him to stop this 01/03/18; his original wound we've been using Hydrofera Blue is now separated into 2 areas. Ulcer on his left buttock is better he is off the recliner and sleeping in bed Finally both wound areas between his  first and second toes also looks some better 01/10/18; his original wound on the left lateral leg is now separated into 2 wounds we've been using Hydrofera Blue Ulcer on his left buttock has some drainage. There is a small probing site going into muscle layer superiorly.using silver alginate -He arrives today with a deep tissue injury on the left heel The wound on the dorsal aspect of his first second toe on the left looks a lot betterusing silver alginate ketoconazole The area on the first second toe web space on the right also looks a lot bette 01/17/18; his original wound on the left lateral leg continues to progress using Hydrofera Blue Ulcer on his left buttock also is smaller surface healthier except for a small probing site going into the muscle layer superiorly. 2.4 cm of tunneling in this area DTI on his left heel we have only been offloading. Looks better than last week no threatened open no evidence of infection the wound on the dorsal aspect of the first second toe on the left continues to look like it's regressing we have only been using silver alginate and terbinafine orally The area in the first second toe web space on the right also looks to be a lot better using silver alginate and terbinafine I think this was prompted by tinea pedis 01/31/18; the patient was hospitalized in [place] last week apparently for a complicated UTI. He was discharged on cefepime he does in and out catheterizations. In the hospital he was discovered M I don't mild elevation of AST and ALTs and the terbinafine was stopped.predictably the pressure ulcer on his buttock looks betterusing silver alginate. The area on the left lateral leg also is better using Hydrofera Blue. The area between the first and second toes on the left better. First and second toes on the right still substantial but better. Finally the DTI on the left heel has held together and looks like it's resolving 02/07/18-he is here in  follow-up evaluation for multiple ulcerations. He has new injury to the lateral aspect of the last issue a pressure ulcer, he  states this is from adhesive removal trauma. He states he has tried multiple adhesive products with no success. All other ulcers appear stable. The left heel DTI is resolving. We will continue with same treatment plan and follow-up next week. 02/14/18; follow-up for multiple areas. He has a new area last week on the lateral aspect of his pressure ulcer more over the posterior trochanter. The original pressure ulcer looks quite stable has healthy granulation. 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 Almost fully epithelialized on the original superior area using Hydrofera Blue DTI on the left heel has peeled off this week to reveal a small superficial wound under denuded skin and subcutaneous tissue Both areas between the first and second toes look better including nothing open on the left 02/21/18; The patient's wounds on his left ischial tuberosity and posterior left greater trochanter actually looked better. He has a large area of irritation around the area which I think is contact dermatitis. I am doubtful that this is fungal His original wound on the left lateral calf continues to improve we have been using Hydrofera Blue There is no open area in the left first second toe web space although there is a lot of thick callus The DTI on the left heel required debridement today of necrotic surface eschar and subcutaneous tissue using silver alginate Finally the area on the right first second toe webspace continues to contract using silver alginate and ketoconazole 02/28/18 Left ischial tuberosity wounds look better using silver alginate. Original wound on the left calf only has one small open area left using Hydrofera Blue DTI on the left heel required debridement mostly  removing skin from around this wound surface. Using silver alginate The areas on the right first/second toe web space using silver alginate and ketoconazole 03/08/18 on evaluation today patient appears to be doing decently well as best I can tell in regard to his wounds. This is the first time that I have seen him as he generally is followed by Dr. Dellia Nims. With that being said none of his wounds appear to be infected he does have an area where there is some skin covering what appears to be a new wound on the left dorsal surface of his great toe. This is right at the nail bed. With that being said I do believe that debrided away some of the excess skin can be of benefit in this regard. Otherwise he has been tolerating the dressing changes without complication. 03/14/18; patient arrives today with the multiplicity of wounds that we are following. He has not been systemically unwell Original wound on the left lateral calf now only has 2 small open areas we've been using Hydrofera Blue which should continue The deep tissue injury on the left heel requires debridement today. We've been using silver alginate The left first second toe and the right first second toe are both are reminiscence what I think was tinea pedis. Apparently some of the callus Surface between the toes was removed last week when it started draining. Purulent drainage coming from the wound on the ischial tuberosity on the left. 03/21/18-He is here in follow-up evaluation for multiple wounds. 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 He tolerated debridement. The only change to last week's recommendations is to discontinue antifungal cream between toes. He will follow-up next week 03/28/18; following up for multiple wounds;Concern this  week is streaking redness and swelling in the right foot. He is going to need antibiotics for this. 03/31/18; follow-up for  right foot cellulitis. Streaking redness and swelling in the right foot on 03/28/18. 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 I put him on clindamycin 300 mg every 6 and brought him in for a quick check. He has an open wound between his first and second toes on the right foot as a potential source. 04/04/18; Right foot cellulitis is resolving he is completing clindamycin. This is truly good news Left lateral calf wound which is initial wound only has one small open area inferiorly this is close to healing out. He has compression stockings. We will use Hydrofera Blue right down to the epithelialization of this Nonviable surface on the left heel which was initially pressure with a DTI. We've been using Hydrofera Blue. I'm going to switch this back to silver alginate Left first second toe/tinea pedis this looks better using silver alginate Right first second toe tinea pedis using silver alginate Large pressure ulcers on theLeft ischial tuberosity. Small wound here Looks better. I am uncertain about the surface over the large wound. Using silver alginate 04/11/18; Cellulitis in the right foot is resolved Left lateral calf wound which was his original wounds still has 2 tiny open areas remaining this is just about closed Nonviable surface on the left heel is better but still requires debridement Left first second toe/tinea pedis still open using silver alginate Right first second toe wound tinea pedis I asked him to go back to using ketoconazole and silver alginate Large pressure ulcers on the left ischial tuberosity this shear injury here is resolved. Wound is smaller. No evidence of infection using silver alginate 04/18/18; Patient arrives with an intense area of cellulitis in the right mid lower calf extending into the right heel area. Bright red and warm. Smaller area on the left anterior leg. He has a significant history of MRSA. He will definitely  need antibioticsdoxycycline He now has 2 open areas on the left ischial tuberosity the original large wound and now a satellite area which I think was above his initial satellite areas. Not a wonderful surface on this satellite area surrounding erythema which looks like pressure related. His left lateral calf wound again his original wound is just about closed Left heel pressure injury still requiring debridement Left first second toe looks a lot better using silver alginate Right first second toe also using silver alginate and ketoconazole cream also looks better 04/20/18; the patient was worked in early today out of concerns with his cellulitis on the right leg. I had started him on doxycycline. This was 2 days ago. His wife was concerned about the swelling in the area. Also concerned about the left buttock. He has not been systemically unwell no fever chills. No nausea vomiting or diarrhea 04/25/18; the patient's left buttock wound is continued to deteriorate he is using Hydrofera Blue. He is still completing clindamycin for the cellulitis on the right leg although all of this looks better. 05/02/18 Left buttock wound still with a lot of drainage and a very tightly adherent fibrinous necrotic surface. He has a deeper area superiorly The left lateral calf wound is still closed DTI wound on the left heel necrotic surface especially the circumference using Iodoflex Areas between his left first second toe and right first second toe both look better. Dorsally and the right first second toe he had a necrotic surface although at smaller.  In using silver alginate and ketoconazole. I did a culture last week which was a deep tissue culture of the reminiscence of the open wound on the right first second toe dorsally. This grew a few Acinetobacter and a few methicillin-resistant staph aureus. Nevertheless the area actually this week looked better. I didn't feel the need to specifically address this at  least in terms of systemic antibiotics. 05/09/18; wounds are measuring larger more drainage per our intake. We are using Santyl covered with alginate on the large superficial buttock wounds, Iodosorb on the left heel, ketoconazole and silver alginate to the dorsal first and second toes bilaterally. 05/16/18; The area on his left buttock better in some aspects although the area superiorly over the ischial tuberosity required an extensive debridement.using Santyl Left heel appears stable. Using Iodoflex The areas between his first and second toes are not bad however there is spreading erythema up the dorsal aspect of his left foot this looks like cellulitis again. He is insensate the erythema is really very brilliant.o Erysipelas He went to [name] an allergist days ago because he was itching part of this he had lab work done. This showed a white count of 15.1 with 70% neutrophils. Hemoglobin of 11.4 and a platelet count of 659,000. Last white count we had in Epic was a 2-1/2 years ago which was 25.9 but he was ill at the time. He was able to show me some lab work that was done by his primary physician the pattern is about the same. 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 But prompted me to go ahead and do x-rays of both feet and the pelvis rule out osteomyelitis. He also had a comprehensive metabolic panel this was reasonably normal his albumin was 3.7 liver function tests BUN/creatinine all normal 05/23/18; x-rays of both his feet from last week were negative for underlying pulmonary abnormality. The x-ray of his pelvis however showed mild irregularity in the left ischial which may represent some early osteomyelitis. The wound in the left ischial continues to get deeper clearly now exposed muscle. Each week necrotic surface material over this area. Whereas the rest of the wounds do not look so bad. The left ischial wound we have been using Santyl and  calcium alginate To the left heel surface necrotic debris using Iodoflex The left lateral leg is still healed Areas on the left dorsal foot and the right dorsal foot are about the same. There is some inflammation on the left which might represent contact dermatitis, fungal dermatitis I am doubtful cellulitis although this looks better than last week 05/30/18; CT scan done at Hospital did not show any osteomyelitis or abscess. Suggested the possibility of underlying cellulitis although I don't [name] a lot of evidence of this at the bedside The wound itself on the left buttock/upper thigh actually looks somewhat better. No debridement Left heel also looks better no debridement continue Iodoflex Both dorsal first second toe spaces appear better using Lotrisone. Left still required debridement 06/06/18; Intake reported some purulent looking drainage from the left gluteal wound. Using Santyl and calcium alginate Left heel looks better although still a nonviable surface requiring debridement The left dorsal foot first/second webspace actually expanding and somewhat deeper. I may consider doing a shave biopsy of this area Right dorsal foot first/second webspace appears stable to improved. Using Lotrisone and silver alginate to both these areas 06/13/18 Left gluteal surface looks better. Now separated in the 2 wounds. No  debridement required. Still drainage. We'll continue silver alginate Left heel continues to look better with Iodoflex continue this for at least another week Of his dorsal foot wounds the area on the left still has some depth although it looks better than last week. We've been using Lotrisone and silver alginate 06/20/18 Left gluteal continues to look better healthy tissue Left heel continues to look better healthy granulation wound is smaller. He is using Iodoflex and his long as this continues continue the Iodoflex Dorsal right foot looks better unfortunately dorsal left foot does  not. There is swelling and erythema of his forefoot. He had minor trauma to this several days ago but doesn't think this was enough to have caused any tissue injury. Foot looks like cellulitis, we have had this problem before 06/27/18 on evaluation today patient appears to be doing a little worse in regard to his foot ulcer. 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 Both of which would really be his only options for treating this infection. In the past he has been given and effusion of Orbactiv. This is done very well for him in the past again it's one time dosing IV antibiotic therapy. Subsequently I do believe this is something we're gonna need to [name] about doing at this point in time. Currently his other wounds seem to be doing somewhat better in my pinion I'm pretty happy in that regard. 07/03/18 on evaluation today patient's wounds actually appear to be doing fairly well. He has been tolerating the dressing changes without complication. All in all he seems to be showing signs of improvement. In regard to the antibiotics he has been dealing with infectious disease since I saw him last week as far as getting this scheduled. In the end he's going to be going to the [facility] to have this done this coming Friday. In the meantime he has been continuing to perform the dressing changes in such as previous. There does not appear to be any evidence of infection worsengin at this time. 07/10/18; Since I last saw this man 2 weeks ago things have actually improved. IV antibiotics of resulted in less forefoot erythema although there is still some present. He is not systemically unwell Left buttock wounds 2 now have no depth there is increased epithelialization Using silver alginate Left heel still requires debridement using Iodoflex Left dorsal foot  still with a sizable wound about the size of a border but healthy granulation Right dorsal foot still with a slitlike area using silver alginate 07/18/18; the patient's cellulitis in the left foot is improved in fact I think it is on its way to resolving. Left buttock wounds 2 both look better although the larger one has hypertension granulation we've been using silver alginate Left heel has some thick circumferential redundant skin over the wound edge which will need to be removed today we've been using Iodoflex Left dorsal foot is still a sizable wound required debridement using silver alginate The right dorsal foot is just about closed only a small open area remains here 07/25/18; left foot cellulitis is resolved Left buttock wounds 2 both look better. Hyper-granulation on the major area Left heel as some debris over the surface but otherwise looks a healthier wound. Using silver collagen Right dorsal foot is just about closed 07/31/18; arrives with our intake nurse worried about purulent drainage from  the buttock. We had hyper-granulation here last week His buttock wounds 2 continue to look better Left heel some debris over the surface but measuring smaller. Right dorsal foot unfortunately has openings between the toes Left foot superficial wound looks less aggravated. 08/07/18 Buttock wounds continue to look better although some of her granulation and the larger medial wound. silver alginate Left heel continues to look a lot better.silver collagen Left foot superficial wound looks less stable. Requires debridement. He has a new wound superficial area on the foot on the lateral dorsal foot. Right foot looks better using silver alginate without Lotrisone 08/14/2018; patient was in the ER last week diagnosed with a UTI. He is now on Cefpodoxime and Macrodantin. Buttock wounds continued to be smaller. Using silver alginate Left heel continues to look better using silver collagen Left foot  superficial wound looks as though it is improving Right dorsal foot area is just about healed. 08/21/2018; patient is completed his antibiotics for his UTI. He has 2 open areas on the buttocks. There is still not closed although the surface looks satisfactory. Using silver alginate Left heel continues to improve using silver collagen The bilateral dorsal foot areas which are at the base of his first and second toes/possible tinea pedis are actually stable on the left but worse on the right. The area on the left required debridement of necrotic surface. After debridement I obtained a specimen for PCR culture. The right dorsal foot which is been just about healed last week is now reopened 08/28/2018; culture done on the left dorsal foot showed coag negative staph both staph epidermidis and Lugdunensis. I think this is worthwhile initiating systemic treatment. I will use doxycycline given his long list of allergies. The area on the left heel slightly improved but still requiring debridement. The large wound on the buttock is just about closed whereas the smaller one is larger. Using silver alginate in this area 09/04/2018; patient is completing his doxycycline for the left foot although this continues to be a very difficult wound area with very adherent necrotic debris. We are using silver alginate to all his wounds right foot left foot and the small wounds on his buttock, silver collagen on the left heel. 09/11/2018; once again this patient has intense erythema and swelling of the left forefoot. Lesser degrees of erythema in the right foot. He has a long list of allergies and intolerances. I will reinstitute doxycycline. 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 Using silver alginate Left heel also looks better using silver collagen Unfortunately both the areas on his feet look worse. The area on the left first second webspace is now gone through to  the plantar part of his foot. The area on the left foot anteriorly is irritated with erythema and swelling in the forefoot. 09/25/2018 His wound on the left plantar heel looks better. Using silver collagen The area on the left buttock 2 small remnant areas. One is closed one is still open. Using silver alginate The areas between both his first and second toes look worse. This in spite of long-standing antifungal therapy with ketoconazole and silver alginate which should have antifungal activity He has small areas around his original wound on the left calf one is on the bottom of the original scar tissue and one superiorly both of these are small and superficial but again given wound history in this site this is worrisome 10/02/2018 Left plantar heel continues to gradually  contract using silver collagen Left buttock wound is unchanged using silver alginate The areas on his dorsal feet between his first and second toes bilaterally look about the same. I prescribed clindamycin ointment to [name] if we can address chronic staph colonization and also the underlying possibility of erythrasma The left lateral lower extremity wound is actually on the lateral part of his ankle. Small open area here. We have been using silver alginate 10/09/2018; Left plantar heel continues to look healthy and contract. No debridement is required Left buttock slightly smaller with a tape injury wound just below which was new this week Dorsal feet somewhat improved I have been using clindamycin Left lateral looks lower extremity the actual open area looks worse although a lot of this is epithelialized. I am going to change to silver collagen today He has a lot more swelling in the right leg although this is not pitting not red and not particularly warm there is a lot of spasm in the right leg usually indicative of people with paralysis of some underlying discomfort. We have reviewed his vascular status from 2017 he had a  left greater saphenous vein ablation. I wonder about referring him back to vascular surgery if the area on the left leg continues to deteriorate. 10/16/2018 in today for follow-up and management of multiple lower extremity ulcers. His left Buttock wound is much lower smaller and almost closed completely. The wound to the left ankle has began to reopen with Epithelialization and some adherent slough. He has multiple new areas to the left foot and leg. The left dorsal foot without much improvement. Wound present between left great webspace and 2nd toe. Erythema and edema present right leg. Right LE ultrasound obtained on 10/10/18 was negative for DVT. 10/23/2018; Left buttock is closed over. Still dry macerated skin but there is no open wound. I suspect this is chronic pressure/moisture Left lateral calf is quite a bit worse than when I saw this last. There is clearly drainage here he has macerated skin into the left plantar heel. We will change the primary dressing to alginate Left dorsal foot has some improvement in overall wound area. Still using clindamycin and silver alginate Right dorsal foot about the same as the left using clindamycin and silver alginate The erythema in the right leg has resolved. He is DVT rule out was negative Left heel pressure area required debridement although the wound is smaller and the surface is health 10/26/2018 The patient came back in for his nurse check today predominantly because of the drainage coming out of the left lateral leg with a recent reopening of his original wound on the left lateral calf. He comes in today with a large amount of surrounding erythema around the wound extending from the calf into the ankle and even in the area on the dorsal foot. He is not systemically unwell. He is not febrile. Nevertheless this looks like cellulitis. We have been using silver alginate to the area. I changed him to a regular visit and I am going to prescribe him  doxycycline. The rationale here is a long list of medication intolerances and a history of MRSA. I did not [name] anything that I thought would provide a valuable culture 10/30/2018 Follow-up from his appointment 4 days ago with really an extensive area of cellulitis in the left calf left lateral ankle and left dorsal foot. I put him on doxycycline. He has a long list of medication allergies which are true allergy reactions. 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 In any case he is a lot better today. The erythema especially in the anterior and lateral left calf is better. He still has left ankle erythema. He also is complaining about increasing edema in the right leg we have only been using Kerlix Coban and he has been doing the wraps at home. Finally he has a spotty rash on the medial part of his upper left calf which looks like folliculitis or perhaps wrap occlusion type injury. Small superficial macules not pustules 11/06/18 patient arrives today with again a considerable degree of erythema around the wound on the left lateral calf extending into the dorsal ankle and dorsal foot. This is a lot worse than when I saw this last week. He is on doxycycline really with not a lot of improvement. He has not been systemically unwell Wounds on the; left heel actually looks improved. Original area on the left foot and proximity to the first and second toes looks about the same. He has superficial areas on the dorsal foot, anterior calf and then the reopening of his original wound on the left lateral calf which looks about the same The only area he has on the right is the dorsal webspace first and second which is smaller. He has a large area of dry erythematous skin on the left buttock small open area here. 11/13/2018; the patient arrives in much better condition. The erythema around the wound on the left lateral calf is a lot better.  Not sure whether this was the clindamycin or the TCA and ketoconazole or just in the improvement in edema control [stasis dermatitis]. In any case this is a lot better. The area on the left heel is very small and just about resolved using silver collagen we have been using silver alginate to the areas on his dorsal feet 11/20/2018; his wounds include the left lateral calf, left heel, dorsal aspects of both feet just proximal to the first second webspace. He is stable to slightly improved. I did not think any changes to his dressings were going to be necessary 11/27/2018 he has a reopening on the left buttock which is surrounded by what looks like tinea or perhaps some other form of dermatitis. The area on the left dorsal foot has some erythema around it I have marked this area but I am not sure whether this is cellulitis or not. Left heel is not closed. Left calf the reopening is really slightly longer and probably worse 1/13; in general things look better and smaller except for the left dorsal foot. Area on the left heel is just about closed, left buttock looks better only a small wound remains in the skin looks better [using Lotrisone] 1/20; the area on the left heel only has a few remaining open areas here. Left lateral calf about the same in terms of size, left dorsal foot slightly larger right lateral foot still not closed. The area on the left buttock has no open wound and the surrounding skin looks a lot better 1/27; the area on the left heel is closed. Left lateral calf better but still requiring extensive debridements. The area on his left buttock is closed. He still has the open areas on the left dorsal foot which is slightly smaller in the right foot which is slightly expanded. We have been using Iodoflex on these areas as well 2/3; left heel is closed. Left lateral calf still requiring debridement using Iodoflex  there is no open area on his left buttock however he has dry scaly skin over  a large area of this. Not really responding well to the Lotrisone. Finally the areas on his dorsal feet at the level of the first second webspace are slightly smaller on the right and about the same on the left. Both of these vigorously debrided with Anasept and gauze 2/10; left heel remains closed he has dry erythematous skin over the left buttock but there is no open wound here. Left lateral leg has come in and with. Still requiring debridement we have been using Iodoflex here. Finally the area on the left dorsal foot and right dorsal foot are really about the same extremely dry callused fissured areas. He does not yet have a dermatology appointment 2/17; left heel remains closed. He has a new open area on the left buttock. The area on the left lateral calf is bigger longer and still covered in necrotic debris. No major change in his foot areas bilaterally. I am awaiting for a dermatologist to look on this. We have been using ketoconazole I do not know that this is been doing any good at all. 2/24; left heel remains closed. The left buttock wound that was new reopening last week looks better. The left lateral calf appears better also although still requires debridement. The major area on his foot is the left first second also requiring debridement. We have been putting Prisma on all wounds. I do not believe that the ketoconazole has done too much good for his feet. He will use Lotrisone I am going to give him a 2-week course of terbinafine. We still do not have a dermatology appointment 3/2 left heel remains closed however there is skin over bone in this area I pointed this out to him today. The left buttock wound is epithelialized but still does not look completely stable. The area on the left leg required debridement were using silver collagen here. With regards to his feet we changed to Lotrisone last week and silver alginate. 3/9; left heel remains closed. Left buttock remains closed. The  area on the right foot is essentially closed. The left foot remains unchanged. Slightly smaller on the left lateral calf. Using silver collagen to both of these areas 3/16-Left heel remains closed. Area on right foot is closed. Left lateral calf above the lateral malleolus open wound requiring debridement with easy bleeding. Left dorsal wound proximal to first toe also debrided. Left ischial area open new. Patient has been using Prisma with wrapping every 3 days. Dermatology appointment is apparently tomorrow.Patient has completed his terbinafine 2-week course with some apparent improvement according to him, there is still flaking and dry skin in his foot on the left 3/23; area on the right foot is reopened. The area on the left anterior foot is about the same still a very necrotic adherent surface. He still has the area on the left leg and reopening is on the left buttock. He apparently saw dermatology although I do not have a note. According to the patient who is usually fairly well informed they did not have any good ideas. Put him on oral terbinafine which she is been on before. 3/30; using silver collagen to all wounds. Apparently his dermatologist put him on doxycycline and rifampin presumably some culture grew staph. I do not have this result. He remains on terbinafine although I have used terbinafine on him before 4/6; patient has had a fairly substantial reopening on the right foot between  the first and second toes. He is finished his terbinafine and I believe is on doxycycline and rifampin still as prescribed by dermatology. We have been using silver collagen to all his wounds although the patient reports that he thinks silver alginate does better on the wounds on his buttock. 4/13; the area on his left lateral calf about the same size but it did not require debridement. Left dorsal foot just proximal to the webspace between the first and second toes is about the same. Still nonviable  surface. I note some superficial bronze discoloration of the dorsal part of his foot Right dorsal foot just proximal to the first and second toes also looks about the same. I still think there may be the same discoloration I noted above on the left Left buttock wound looks about the same 4/20; left lateral calf appears to be gradually contracting using silver collagen. 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 The left dorsal foot wound is debrided of tightly adherent necrotic debris and really cleans up quite nicely. The right area is worse with expansion. I did not debride this it is now over the base of the second toe The area on his left buttock is smaller no debridement is required using silver collagen 5/4; left calf continues to make good progress. He arrives with erythema around the wounds on his dorsal foot which even extends to the plantar aspect. Very concerning for coexistent infection. He is finished the erythromycin I gave him for possible erythrasma this does not seem to have helped. The area on the left foot is about the same base of the dorsal toes Is area on the buttock looks improved on the left 5/11; left calf and left buttock continued to make good progress. Left foot is about the same to slightly improved. Major problem is on the right foot. He has not had an x-ray. Deep tissue culture I did last week showed both Enterobacter and E. coli. I did not change the doxycycline I put him on empirically although neither 1 of these were plated to doxycycline. He arrives today with the erythema looking worse on both the dorsal and plantar foot. Macerated skin on the bottom of the foot. he has not been systemically unwell 5/18-Patient returns at 1 week, left calf wound appears to be making some progress, left buttock wound appears slightly worse than last time, left foot wound looks slightly better, right foot redness is  marginally better. X-ray of both feet show no air or evidence of osteomyelitis. Patient is finished his Omnicef and terbinafine. He continues to have macerated skin on the bottom of the left foot as well as right 5/26; left calf wound is better, left buttock wound appears to have multiple small superficial open areas with surrounding macerated skin. X-rays that I did last time showed no evidence of osteomyelitis in either foot. He is finished cefdinir and doxycycline. I do not think that he was on terbinafine. He continues to have a large superficial open area on the right foot anterior dorsal and slightly between the first and second toes. I did send him to dermatology 2 months ago or so wondering about whether they would do a fungal scraping. I do not believe they did but did do a culture. We have been using silver alginate to the toe areas, he has been using antifungals at home topically either ketoconazole or Lotrisone. We are using silver collagen on the left foot, silver alginate on the  right, silver collagen on the left lateral leg and silver alginate on the left buttock 6/1; left buttock area is healed. We have the left dorsal foot, left lateral leg and right dorsal foot. We are using silver alginate to the areas on both feet and silver collagen to the area on his left lateral calf 6/8; the left buttock apparently reopened late last week. He is not really sure how this happened. He is tolerating the terbinafine. Using silver alginate to all wounds 6/15; left buttock wound is larger than last week but still superficial. Came in the clinic today with a report of purulence from the left lateral leg I did not identify any infection Both areas on his dorsal feet appear to be better. He is tolerating the terbinafine. Using silver alginate to all wounds 6/22; left buttock is about the same this week, left calf quite a bit better. His left foot is about the same however he comes in with erythema  and warmth in the right forefoot once again. Culture that I gave him in the beginning of May showed Enterobacter and E. coli. I gave him doxycycline and things seem to improve although neither 1 of these organisms was specifically plated. 6/29; left buttock is larger and dry this week. Left lateral calf looks to me to be improved. Left dorsal foot also somewhat improved right foot completely unchanged. The erythema on the right foot is still present. He is completing the Ceftin dinner that I gave him empirically [[name] discussion above.) 7/6 - All wounds look to be stable and perhaps improved, the left buttock wound is slightly smaller, per patient bleeds easily, completed ceftin, the right foot redness is less, he is on terbinafine 7/13; left buttock wound about the same perhaps slightly narrower. Area on the left lateral leg continues to narrow. Left dorsal foot slightly smaller right foot about the same. We are using silver alginate on the right foot and Hydrofera Blue to the areas on the left. Unna boot on the left 2 layer compression on the right 7/20; left buttock wound absolutely the same. Area on lateral leg continues to get better. Left dorsal foot require debridement as did the right no major change in the 7/27; left buttock wound the same size necrotic debris over the surface. The area on the lateral leg is closed once again. His left foot looks better right foot about the same although there is some involvement now of the posterior first second toe area. He is still on terbinafine which I have given him for a month, not certain a centimeter major change 06/25/19-All wounds appear to be slightly improved according to report, left buttock wound looks clean, both foot wounds have minimal to no debris the right dorsal foot has minimal slough. We are using Hydrofera Blue to the left and silver alginate to the right foot and ischial wound. 8/10-Wounds all appear to be around the same, the right  forefoot distal part has some redness which was not there before, however the wound looks clean and small. Ischial wound looks about the same with no changes 8/17; his wound on the left lateral calf which was his original chronic venous insufficiency wound remains closed. Since I last saw him the areas on the left dorsal foot right dorsal foot generally appear better but require debridement. The area on his left initial tuberosity appears somewhat larger to me perhaps hyper granulated and bleeds very easily. We have been using Hydrofera Blue to the left dorsal foot and  silver alginate to everything else 8/24; left lateral calf remains closed. The areas on his dorsal feet on the webspace of the first and second toes bilaterally both look better. The area on the left buttock which is the pressure ulcer stage II slightly smaller. I change the dressing to Hydrofera Blue to all areas 8/31; left lateral calf remains closed. The area on his dorsal feet bilaterally look better. Using Hydrofera Blue. Still requiring debridement on the left foot. No change in the left buttock pressure ulcers however 9/14; left lateral calf remains closed. Dorsal feet look quite a bit better than 2 weeks ago. Flaking dry skin also a lot better with the ammonium lactate I gave him 2 weeks ago. The area on the left buttock is improved. He states that his Roho cushion developed a leak and he is getting a new one, in the interim he is offloading this vigorously 9/21; left calf remains closed. Left heel which was a possible DTI looks better this week. He had macerated tissue around the left dorsal foot right foot looks satisfactory and improved left buttock wound. I changed his dressings to his feet to silver alginate bilaterally. Continuing Hydrofera Blue on the left buttock. 9/28 left calf remains closed. Left heel did not develop anything [possible DTI] dry flaking skin on the left dorsal foot. Right foot looks satisfactory.  Improved left buttock wound. We are using silver alginate on his feet Hydrofera Blue on the buttock. I have asked him to go back to the Lotrisone on his feet including the wounds and surrounding areas 10/5; left calf remains closed. The areas on the left and right feet about the same. A lot of this is epithelialized however debris over the remaining open areas. He is using Lotrisone and silver alginate. The area on the left buttock using Hydrofera Blue 10/26. Patient has been out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 this. Her wound, Hydrafera to the lower wound. He has a new area on the right second toe. Left dorsal foot firstoosecond toe also looks improved 05/24/16; wound dimensions must be smaller I was able to use Apligraf to all 3 remaining wound areas. 06/07/16 patient's last Apligraf was 2 weeks ago. He arrives today with the 2 wounds on his lateral left leg joined together. This would have to be seen as a negative. He also has a small wound in his first and second toe on the left dorsally with quite a bit of surrounding erythema in the first second and third toes. This looks to be infected or inflamed, very difficult clinical call. 06/21/16:  lateral left leg combined wounds. Adherent surface slough area on the left dorsal foot at roughly the fourth toe looks improved 07/12/16; he now has a single linear wound on the lateral left leg. This does not look to be a lot changed from when I lost saw this. The area on his dorsal left foot looks considerably better however. 08/02/16; no major change in the substantial area on his left lateral leg since last time. We have been using Hydrofera Blue for a prolonged period of time now. The area on his left foot is also unchanged from last review 07/19/16; the area on his dorsal foot on the left looks considerably smaller. He is beginning to have significant rims of epithelialization on the lateral left leg wound. This also looks better. 08/05/16; the patient came in for a nurse visit today. Apparently the area on his left lateral leg looks better and it was wrapped. However in general discussion the patient noted a new area on the dorsal aspect of his right second toe. The exact etiology of this is unclear but likely relates to pressure. 08/09/16 really the area on the left lateral leg did not really look that healthy today perhaps slightly larger and measurements. The area on his dorsal right second toe is improved also the left foot wound looks stable to improved 08/16/16; the area on the last lateral leg did not change any of dimensions. Post debridement with a curet the area looked better. Left foot wound improved and the area on the dorsal right second toe is improved 08/23/16; the area on the left lateral leg may be slightly smaller both in terms of length and width. Aggressive debridement with a curette afterwards the tissue appears healthier. Left foot wound appears improved in the area on the dorsal right second toe is improved 08/30/16 patient developed a fever over the weekend and was seen in an urgent care. Felt to have a UTI and put on doxycycline. He has been since changed over the phone to  [facility]. After we took off the wrap on his right leg today the leg is swollen warm and erythematous, probably more likely the source of the fever 09/06/16; have been using collagen to the major left leg wound, silver alginate to the area on his anterior foot/toes 09/13/16; the areas on his anterior foot/toes on both sides appear to be virtually closed. Extensive wound on the left lateral leg perhaps slightly narrower but each visit still covered an adherent surface slough 09/16/16 patient was in for  his usual Thursday nurse visit however the intake nurse noted significant erythema of his dorsal right foot. He is also running a low-grade fever and having increasing spasms in the right leg 09/20/16 here for cellulitis involving his right great toes and forefoot. This is a lot better. Still requiring debridement on his left lateral leg. Santyl direct says he needs prior authorization. Therefore his wife cannot change this at home 09/30/16; the patient's extensive area on the left lateral calf and ankle perhaps somewhat better. Using Santyl. The area on the left toes is healed and I think the area on his right dorsal foot is healed as well. There is no cellulitis or venous inflammation involving the right leg. He is going to need compression stockings here. 10/07/16; the patient's extensive wound on the left lateral calf and ankle does not measure any differently however there appears to be less adherent surface slough using Santyl and aggressive weekly debridements 10/21/16; no major change in the area on the left lateral calf. Still the same measurement still very difficult to debridement adherent slough and nonviable subcutaneous tissue. This is not really been helped by several weeks of Santyl. Previously for 2 weeks I used Iodoflex for a short period. A prolonged course of Hydrofera Blue didn't really help. I'm not sure why I only used 2 weeks of Iodoflex on this there is no evidence of surrounding  infection. He has a small area on the right second toe which looks as though it's progressing towards closure 10/28/16; the wounds on his toes appear to be closed. No major change in the left lateral leg wound although the surface looks somewhat better using Iodoflex. He has had previous arterial studies that were normal. He has had reflux studies and is status post ablation although I don't have any exact notes on which vein was ablated. I'll need to check the surgical record 11/04/16; he's had a reopening between the first and second toe on the left and right. No major change in the left lateral leg wound. There is what appears to be cellulitis of the left dorsal foot 11/18/16 the patient was hospitalized initially in [place] and then subsequently transferred to [facility] long and was admitted there from 11/09/16 through 11/12/16. He had developed progressive cellulitis on the right leg in spite of the doxycycline I gave him. I'd spoken to the hospitalist in [place] who was concerned about continuing leukocytosis. CT scan is what I suggested this was done which showed soft tissue swelling without evidence of osteomyelitis or an underlying abscess blood cultures were negative. At [facility] he was treated with vancomycin and Primaxin and then add an infectious disease consult. He was transitioned to Ceftaroline. He has been making progressive improvement. Overall a severe cellulitis of the right leg. He is been using silver alginate to her original wound on the left leg. The wounds in his toes on the right are closed there is a small open area on the base of the left second toe 11/26/15; the patient's right leg is much better although there is still some edema here this could be reminiscent from his severe cellulitis likely on top of some degree of lymphedema. His left anterior leg wound has less surface slough as reported by her intake nurse. Small wound at the base of the left second toe 12/02/16;  patient's right leg is better and there is no open wound here. His left anterior lateral leg wound continues to have a healthy-looking surface. Small wound at the base  of the left second toe however there is erythema in the left forefoot which is worrisome 12/16/16; is no open wounds on his right leg. We took measurements for stockings. His left anterior lateral leg wound continues to have a healthy-looking surface. I'm not sure where we were with the Apligraf run through his insurance. We have been using Iodoflex. He has a thick eschar on the left first second toe interface, I suspect this may be fungal however there is no visible open 12/23/16; no open wound on his right leg. He has 2 small areas left of the linear wound that was remaining last week. We have been using Prisma, I thought I have disclosed this week, we can only look forward to next week 01/03/17; the patient had concerning areas of erythema last week, already on doxycycline for UTI through his primary doctor. The erythema is absolutely no better there is warmth and swelling both medially from the left lateral leg wound and also the dorsal left foot. 01/06/17- Patient is here for follow-up evaluation of his left lateral leg ulcer and bilateral feet ulcers. He is on oral antibiotic therapy, tolerating that. Nursing staff and the patient states that the erythema is improved from Monday. 01/13/17; the predominant left lateral leg wound continues to be problematic. I had put Apligraf on him earlier this month once. However he subsequently developed what appeared to be an intense cellulitis around the left lateral leg wound. I gave him Dalvance I think on 2/12 perhaps 2/13 he continues on cefdinir. The erythema is still present but the warmth and swelling is improved. I am hopeful that the cellulitis part of this control. I wouldn't be surprised if there is an element of venous inflammation as well. 01/17/17. The erythema is present but better  in the left leg. His left lateral leg wound still does not have a viable surface buttons certain parts of this long thin wound it appears like there has been improvement in dimensions. 01/20/17; the erythema still present but much better in the left leg. I'm thinking this is his usual degree of chronic venous inflammation. The wound on the left leg looks somewhat better. Is less surface slough 01/27/17; erythema is back to the chronic venous inflammation. The wound on the left leg is somewhat better. I am back to the point where I like to try an Apligraf once again 02/10/17; slight improvement in wound dimensions. Apligraf #2. He is completing his doxycycline 02/14/17; patient arrives today having completed doxycycline last Thursday. This was supposed to be a nurse visit however once again he hasn't tense erythema from the medial part of his wound extending over the lower leg. Also erythema in his foot this is roughly in the same distribution as last time. He has baseline chronic venous inflammation however this is a lot worse than the baseline I have learned to accept the on him is baseline inflammation 02/24/17- patient is here for follow-up evaluation. He is tolerating compression therapy. His voicing no complaints or concerns he is here anticipating an Apligraf 03/03/17; he arrives today with an adherent necrotic surface. I don't think this is surface is going to be amenable for Apligraf's. The erythema around his wound and on the left dorsal foot has resolved he is off antibiotics 03/10/17; better-looking surface today. I don't think he can tolerate Apligraf's. He tells me he had a wound VAC after a skin graft years ago to this area and they had difficulty with a seal. The erythema continues to be  stable around this some degree of chronic venous inflammation but he also has recurrent cellulitis. We have been using Iodoflex 03/17/17; continued improvement in the surface and may be small changes in  dimensions. Using Iodoflex which seems the only thing that will control his surface 03/24/17- He is here for follow up evaluation of his LLE lateral ulceration and ulcer to right dorsal foot/toe space. He is voicing no complaints or concerns, He is tolerating compression wrap. 03/31/17 arrives today with a much healthier looking wound on the left lower extremity. We have been using Iodoflex for a prolonged period of time which has for the first time prepared and adequate looking wound bed although we have not had much in the way of wound dimension improvement. He also has a small wound between the first and second toe on the right 04/07/17; arrives today with a healthy-looking wound bed and at least the top 50% of this wound appears to be now her. No debridement was required I have changed him to [facility] last week after prolonged Iodoflex. He did not do well with Apligraf's. We've had a re-opening between the first and second toe on the right 04/14/17; arrives today with a healthier looking wound bed contractions and the top 50% of this wound and some on the lesser 50%. Wound bed appears healthy. The area between the first and second toe on the right still remains problematic 04/21/17; continued very gradual improvement. Using [facility] 04/28/17; continued very gradual improvement in the left lateral leg venous insufficiency wound. His periwound erythema is very mild. We have been using Hydrofera Blue. Wound is making progress especially in the superior 50% 05/05/17; he continues to have very gradual improvement in the left lateral venous insufficiency wound. Both in terms with an length rings are improving. I debrided this every 2 weeks with #5 curet and we have been using Hydrofera Blue and again making good progress With regards to the wounds between his right first and second toe which I thought might of been tinea pedis he is not making as much progress very dry scaly skin over the  area. Also the area at the base of the left first and second toe in a similar condition 05/12/17; continued gradual improvement in the refractory left lateral venous insufficiency wound on the left. Dimension smaller. Surface still requiring debridement using Hydrofera Blue 05/19/17; continued gradual improvement in the refractory left lateral venous ulceration. Careful inspection of the wound bed underlying rumination suggested some degree of epithelialization over the surface no debridement indicated. Continue Hydrofera Blue difficult areas between his toes first and third on the left than first and second on the right. I'm going to change to silver alginate from silver collagen. Continue ketoconazole as I suspect underlying tinea pedis 05/26/17; left lateral leg venous insufficiency wound. We've been using Hydrofera Blue. I believe that there is expanding epithelialization over the surface of the wound albeit not coming from the wound circumference. This is a bit of an odd situation in which the epithelialization seems to be coming from the surface of the wound rather than in the exact circumference. There is still small open areas mostly along the lateral margin of the wound. ooHe has unchanged areas between the left first and second and the right first second toes which I been treating for tenia pedis 06/02/17; left lateral leg venous insufficiency wound. We have been using Hydrofera Blue. Somewhat smaller from the wound circumference. The surface of the wound remains a bit on it  almost epithelialized sedation in appearance. I use an open curette today debridement in the surface of all of this especially the edges ooSmall open wounds remaining on the dorsal right first and second toe interspace and the plantar left first second toe and her face on the left 06/09/17; wound on the left lateral leg continues to be smaller but very gradual and very dry surface using Hydrofera Blue 06/16/17 requires  weekly debridements now on the left lateral leg although this continues to contract. I changed to silver collagen last week because of dryness of the wound bed. Using Iodoflex to the areas on his first and second toes/web space bilaterally 06/24/17; patient with history of paraplegia also chronic venous insufficiency with lymphedema. Has a very difficult wound on the left lateral leg. This has been gradually reducing in terms of with but comes in with a very dry adherent surface. High switch to silver collagen a week or so ago with hydrogel to keep the area moist. This is been refractory to multiple dressing attempts. He also has areas in his first and second toes bilaterally in the anterior and posterior web space. I had been using Iodoflex here after a prolonged course of silver alginate with ketoconazole was ineffective [question tinea pedis] 07/14/17; patient arrives today with a very difficult adherent material over his left lateral lower leg wound. He also has surrounding erythema and poorly controlled edema. He was switched his Santyl last visit which the nurses are applying once during his doctor visit and once on a nurse visit. He was also reduced to 2 layer compression I'm not exactly sure of the issue here. 07/21/17; better surface today after 1 week of Iodoflex. Significant cellulitis that we treated last week also better. [Doxycycline] 07/28/17 better surface today with now 2 weeks of Iodoflex. Significant cellulitis treated with doxycycline. He has now completed the doxycycline and he is back to his usual degree of chronic venous inflammation/stasis dermatitis. He reminds me he has had ablations surgery here 08/04/17; continued improvement with Iodoflex to the left lateral leg wound in terms of the surface of the wound although the dimensions are better. 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 Problematic areas on  the plantar aspect of the first second toe web space on the left and the dorsal aspect of the first second toe web space on the right. At one point I felt these were probably related to chronic fungal infections in treated him aggressively for this although we have not made any improvement here. 08/11/17; left lateral leg. Surface continues to improve with the Iodoflex although we are not seeing much improvement in overall wound dimensions. Areas on his plantar left foot and right foot show no improvement. In fact the right foot looks somewhat worse 08/18/17; left lateral leg. We changed to [facility] Blue last week after a prolonged course of Iodoflex which helps get the surface better. It appears that the wound with is improved. Continue with difficult areas on the left dorsal first second and plantar first second on the right 09/01/17; patient arrives in clinic today having had a temperature of 103 yesterday. He was seen in the ER and [facility]. The patient was concerned he could have cellulitis again in the right leg however they diagnosed him with a UTI and he is now on Keflex. He has a history of cellulitis which is been recurrent and difficult but this is been in the left leg, in the  past 5 use doxycycline. He does in and out catheterizations at home which are risk factors for UTI 09/08/17; patient will be completing his Keflex this weekend. The erythema on the left leg is considerably better. He has a new wound today on the medial part of the right leg small superficial almost looks like a skin tear. He has worsening of the area on the right dorsal first and second toe. His major area on the left lateral leg is better. Using Hydrofera Blue on all areas 09/15/17; gradual reduction in width on the long wound in the left lateral leg. No debridement required. He also has wounds on the plantar aspect of his left first second toe web space and on the dorsal aspect of the right first second toe web  space. 09/22/17; there continues to be very gradual improvements in the dimensions of the left lateral leg wound. He hasn't round erythematous spot with might be pressure on his wheelchair. There is no evidence obviously of infection no purulence no warmth ooHe has a dry scaled area on the plantar aspect of the left first second toe ooImproved area on the dorsal right first second toe. 09/29/17; left lateral leg wound continues to improve in dimensions mostly with an is still a fairly long but increasingly narrow wound. ooHe has a dry scaled area on the plantar aspect of his left first second toe web space ooIncreasingly concerning area on the dorsal right first second toe. In fact I am concerned today about possible cellulitis around this wound. The areas extending up his second toe and although there is deformities here almost appears to abut on the nailbed. 10/06/17; left lateral leg wound continues to make very gradual progress. Tissue culture I did from the right first second toe dorsal foot last time grew MRSA and enterococcus which was vancomycin sensitive. This was not sensitive to clindamycin or doxycycline. He is allergic to Zyvox and sulfa we have therefore arrange for him to have dalvance infusion tomorrow. He is had this in the past and tolerated it well 10/20/17; left lateral leg wound continues to make decent progress. This is certainly reduced in terms of with there is advancing epithelialization.ooThe cellulitis in the right foot looks better although he still has a deep wound in the dorsal aspect of the first second toe web space. Plantar left first toe web space on the left I think is making some progress 10/27/17; left lateral leg wound continues to make decent progress. Advancing epithelialization.using Hydrofera Blue ooThe right first second toe web space wound is better-looking using silver alginate ooImprovement in the left plantar first second toe web space. Again  using silver alginate 11/03/17 left lateral leg wound continues to make decent progress albeit slowly. Using Hydrofera Blue ooThe right per second toe web space continues to be a very problematic looking punched out wound. I obtained a piece of tissue for deep culture I did extensively treated this for fungus. It is difficult to imagine that this is a pressure area as the patient states other than going outside he doesn't really wear shoes at home ooThe left plantar first second toe web space looked fairly senescent. Necrotic edges. This required debridement oochange to Hydrofera Blue to all wound areas 11/10/17; left lateral leg wound continues to contract. Using Hydrofera Blue ooOn the right dorsal first second toe web space dorsally. Culture I did of this area last week grew MRSA there is not an easy oral option in this patient was multiple antibiotic allergies or  intolerances. This was only a rare culture isolate I'm therefore going to use Bactroban under silver alginate ooOn the left plantar first second toe web space. Debridement is required here. This is also unchanged 11/17/17; left lateral leg wound continues to contract using Hydrofera Blue this is no longer the major issue. ooThe major concern here is the right first second toe web space. He now has an open area going from dorsally to the plantar aspect. There is now wound on the inner lateral part of the first toe. Not a very viable surface on this. There is erythema spreading medially into the forefoot. ooNo major change in the left first second toe plantar wound 11/24/17; left lateral leg wound continues to contract using Hydrofera Blue. Nice improvement today ooThe right first second toe web space all of this looks a lot less angry than last week. I have given him clindamycin and topical Bactroban for MRSA and terbinafine for the possibility of underlining tinea pedis that I could not control with ketoconazole. Looks somewhat  better ooThe area on the plantar left first second toe web space is weeping with dried debris around the wound 12/01/17; left lateral leg wound continues to contract he Hydrofera Blue. It is becoming thinner in terms of with nevertheless it is making good improvement. ooThe right first second toe web space looks less angry but still a large necrotic-looking wounds starting on the plantar aspect of the right foot extending between the toes and now extensively on the base of the right second toe. I gave him clindamycin and topical Bactroban for MRSA anterior benefiting for the possibility of underlying tinea pedis. Not looking better today ooThe area on the left first/second toe looks better. Debrided of necrotic debris 12/05/17* the patient was worked in urgently today because over the weekend he found blood on his incontinence bad when he woke up. He was found to have an ulcer by his wife who does most of his wound care. He came in today for [place] to look at this. He has not had a history of wounds in his buttocks in spite of his paraplegia. 12/08/17; seen in follow-up today at his usual appointment. He was seen earlier this week and found to have a new wound on his buttock. We also follow him for wounds on the left lateral leg, left first second toe web space and right first second toe web space 12/15/17; we have been using Hydrofera Blue to the left lateral leg which has improved. The right first second toe web space has also improved. Left first second toe web space plantar aspect looks stable. The left buttock has worsened using Santyl. Apparently the buttock has drainage 12/22/17; we have been using Hydrofera Blue to the left lateral leg which continues to improve now 2 small wounds separated by normal skin. He tells [place] he had a fever up to 100 yesterday he is prone to UTIs but has not noted anything different. He does in and out catheterizations. The area between the first and second toes today  does not look good necrotic surface covered with what looks to be purulent drainage and erythema extending into the third toe. I had gotten this to something that I thought look better last time however it is not look good today. He also has a necrotic surface over the buttock wound which is expanded. I thought there might be infection under here so I removed a lot of the surface with a #5 curet though nothing look like it  really needed culturing. He is been using Santyl to this area 12/27/17; his original wound on the left lateral leg continues to improve using Hydrofera Blue. I gave him samples of Baxdella although he was unable to take them out of fear for an allergic reaction ["lump in his throat"].the culture I did of the purulent drainage from his second toe last week showed both enterococcus and a set Enterobacter I was also concerned about the erythema on the bottom of his foot although paradoxically although this looks somewhat better today. 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 We've been using silver alginate here. They came up today that he sleeps in a recliner, I'm not sure why but I asked him to stop this 01/03/18; his original wound we've been using Hydrofera Blue is now separated into 2 areas. ooUlcer on his left buttock is better he is off the recliner and sleeping in bed ooFinally both wound areas between his first and second toes also looks some better 01/10/18; his original wound on the left lateral leg is now separated into 2 wounds we've been using Hydrofera Blue ooUlcer on his left buttock has some drainage. There is a small probing site going into muscle layer superiorly.using silver alginate -He arrives today with a deep tissue injury on the left heel ooThe wound on the dorsal aspect of his first second toe on the left looks a lot betterusing silver  alginate ketoconazole ooThe area on the first second toe web space on the right also looks a lot bette 01/17/18; his original wound on the left lateral leg continues to progress using Hydrofera Blue ooUlcer on his left buttock also is smaller surface healthier except for a small probing site going into the muscle layer superiorly. 2.4 cm of tunneling in this area ooDTI on his left heel we have only been offloading. Looks better than last week no threatened open no evidence of infection oothe wound on the dorsal aspect of the first second toe on the left continues to look like it's regressing we have only been using silver alginate and terbinafine orally ooThe area in the first second toe web space on the right also looks to be a lot better using silver alginate and terbinafine I think this was prompted by tinea pedis 01/31/18; the patient was hospitalized in [place] last week apparently for a complicated UTI. He was discharged on cefepime he does in and out catheterizations. In the hospital he was discovered M I don't mild elevation of AST and ALTs and the terbinafine was stopped.predictably the pressure ulcer on his buttock looks betterusing silver alginate. The area on the left lateral leg also is better using Hydrofera Blue. The area between the first and second toes on the left better. First and second toes on the right still substantial but better. Finally the DTI on the left heel has held together and looks like it's resolving 02/07/18-he is here in follow-up evaluation for multiple ulcerations. He has new injury to the lateral aspect of the last issue a pressure ulcer, he states this is from adhesive removal trauma. He states he has tried multiple adhesive products with no success. All other ulcers appear stable. The left heel DTI is resolving. We will continue with same treatment plan and follow-up next week. 02/14/18; follow-up for multiple areas. ooHe has a new area last week on  the lateral aspect of his pressure ulcer more over the  posterior trochanter. The original pressure ulcer looks quite stable has healthy granulation. 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 Almost fully epithelialized on the original superior area using Hydrofera Blue ooDTI on the left heel has peeled off this week to reveal a small superficial wound under denuded skin and subcutaneous tissue ooBoth areas between the first and second toes look better including nothing open on the left 02/21/18; ooThe patient's wounds on his left ischial tuberosity and posterior left greater trochanter actually looked better. He has a large area of irritation around the area which I think is contact dermatitis. I am doubtful that this is fungal ooHis original wound on the left lateral calf continues to improve we have been using Hydrofera Blue ooThere is no open area in the left first second toe web space although there is a lot of thick callus ooThe DTI on the left heel required debridement today of necrotic surface eschar and subcutaneous tissue using silver alginate ooFinally the area on the right first second toe webspace continues to contract using silver alginate and ketoconazole 02/28/18 ooLeft ischial tuberosity wounds look better using silver alginate. ooOriginal wound on the left calf only has one small open area left using Hydrofera Blue ooDTI on the left heel required debridement mostly removing skin from around this wound surface. Using silver alginate ooThe areas on the right first/second toe web space using silver alginate and ketoconazole 03/08/18 on evaluation today patient appears to be doing decently well as best I can tell in regard to his wounds. This is the first time that I have seen him as he generally is followed by Dr. Dellia Nims. With that being said none of his wounds  appear to be infected he does have an area where there is some skin covering what appears to be a new wound on the left dorsal surface of his great toe. This is right at the nail bed. With that being said I do believe that debrided away some of the excess skin can be of benefit in this regard. Otherwise he has been tolerating the dressing changes without complication. 03/14/18; patient arrives today with the multiplicity of wounds that we are following. He has not been systemically unwell ooOriginal wound on the left lateral calf now only has 2 small open areas we've been using Hydrofera Blue which should continue ooThe deep tissue injury on the left heel requires debridement today. We've been using silver alginate ooThe left first second toe and the right first second toe are both are reminiscence what I think was tinea pedis. Apparently some of the callus Surface between the toes was removed last week when it started draining. ooPurulent drainage coming from the wound on the ischial tuberosity on the left. 03/21/18-He is here in follow-up evaluation for multiple wounds. 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 He tolerated debridement. The only change to last week's recommendations is to discontinue antifungal cream between toes. He will follow-up next week 03/28/18; following up for multiple wounds;Concern this week is streaking redness and swelling in the right foot. He is going to need antibiotics for this. 03/31/18; follow-up for right foot cellulitis. Streaking redness and swelling in the right foot on 03/28/18. 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 I put him on clindamycin 300 mg every 6 and brought him in for a quick check. He has  an open wound between his first and second toes on the right foot as a potential source. 04/04/18; ooRight foot cellulitis is resolving he is completing  clindamycin. This is truly good news ooLeft lateral calf wound which is initial wound only has one small open area inferiorly this is close to healing out. He has compression stockings. We will use Hydrofera Blue right down to the epithelialization of this ooNonviable surface on the left heel which was initially pressure with a DTI. We've been using Hydrofera Blue. I'm going to switch this back to silver alginate ooLeft first second toe/tinea pedis this looks better using silver alginate ooRight first second toe tinea pedis using silver alginate ooLarge pressure ulcers on theLeft ischial tuberosity. Small wound here Looks better. I am uncertain about the surface over the large wound. Using silver alginate 04/11/18; ooCellulitis in the right foot is resolved ooLeft lateral calf wound which was his original wounds still has 2 tiny open areas remaining this is just about closed ooNonviable surface on the left heel is better but still requires debridement ooLeft first second toe/tinea pedis still open using silver alginate ooRight first second toe wound tinea pedis I asked him to go back to using ketoconazole and silver alginate ooLarge pressure ulcers on the left ischial tuberosity this shear injury here is resolved. Wound is smaller. No evidence of infection using silver alginate 04/18/18; ooPatient arrives with an intense area of cellulitis in the right mid lower calf extending into the right heel area. Bright red and warm. Smaller area on the left anterior leg. He has a significant history of MRSA. He will definitely need antibioticsoodoxycycline ooHe now has 2 open areas on the left ischial tuberosity the original large wound and now a satellite area which I think was above his initial satellite areas. Not a wonderful surface on this satellite area surrounding erythema which looks like pressure related. ooHis left lateral calf wound again his original wound is just about  closed ooLeft heel pressure injury still requiring debridement ooLeft first second toe looks a lot better using silver alginate ooRight first second toe also using silver alginate and ketoconazole cream also looks better 04/20/18; the patient was worked in early today out of concerns with his cellulitis on the right leg. I had started him on doxycycline. This was 2 days ago. His wife was concerned about the swelling in the area. Also concerned about the left buttock. He has not been systemically unwell no fever chills. No nausea vomiting or diarrhea 04/25/18; the patient's left buttock wound is continued to deteriorate he is using Hydrofera Blue. He is still completing clindamycin for the cellulitis on the right leg although all of this looks better. 05/02/18 ooLeft buttock wound still with a lot of drainage and a very tightly adherent fibrinous necrotic surface. He has a deeper area superiorly ooThe left lateral calf wound is still closed ooDTI wound on the left heel necrotic surface especially the circumference using Iodoflex ooAreas between his left first second toe and right first second toe both look better. Dorsally and the right first second toe he had a necrotic surface although at smaller. In using silver alginate and ketoconazole. I did a culture last week which was a deep tissue culture of the reminiscence of the open wound on the right first second toe dorsally. This grew a few Acinetobacter and a few methicillin-resistant staph aureus. Nevertheless the area actually this week looked better. I didn't feel the need to specifically address this at least in  terms of systemic antibiotics. 05/09/18; wounds are measuring larger more drainage per our intake. We are using Santyl covered with alginate on the large superficial buttock wounds, Iodosorb on the left heel, ketoconazole and silver alginate to the dorsal first and second toes bilaterally. 05/16/18; ooThe area on his left  buttock better in some aspects although the area superiorly over the ischial tuberosity required an extensive debridement.using Santyl ooLeft heel appears stable. Using Iodoflex ooThe areas between his first and second toes are not bad however there is spreading erythema up the dorsal aspect of his left foot this looks like cellulitis again. He is insensate the erythema is really very brilliant.o Erysipelas He went to [name] allergist days ago because he was itching part of this he had lab work done. This showed a white count of 15.1 with 70% neutrophils. Hemoglobin of 11.4 and a platelet count of 659,000. Last white count we had in Epic was a 2-1/2 years ago which was 25.9 but he was ill at the time. He was able to show me some lab work that was done by his primary physician the pattern is about the same. 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 But prompted me to go ahead and do x-rays of both feet and the pelvis rule out osteomyelitis. He also had a comprehensive metabolic panel this was reasonably normal his albumin was 3.7 liver function tests BUN/creatinine all normal 05/23/18; x-rays of both his feet from last week were negative for underlying pulmonary abnormality. The x-ray of his pelvis however showed mild irregularity in the left ischial which may represent some early osteomyelitis. The wound in the left ischial continues to get deeper clearly now exposed muscle. Each week necrotic surface material over this area. Whereas the rest of the wounds do not look so bad. ooThe left ischial wound we have been using Santyl and calcium alginate ooTo the left heel surface necrotic debris using Iodoflex ooThe left lateral leg is still healed ooAreas on the left dorsal foot and the right dorsal foot are about the same. There is some inflammation on the left which might represent contact dermatitis, fungal dermatitis I am doubtful cellulitis  although this looks better than last week 05/30/18; CT scan done at Hospital did not show any osteomyelitis or abscess. Suggested the possibility of underlying cellulitis although I don't [name] a lot of evidence of this at the bedside ooThe wound itself on the left buttock/upper thigh actually looks somewhat better. No debridement ooLeft heel also looks better no debridement continue Iodoflex ooBoth dorsal first second toe spaces appear better using Lotrisone. Left still required debridement 06/06/18; ooIntake reported some purulent looking drainage from the left gluteal wound. Using Santyl and calcium alginate ooLeft heel looks better although still a nonviable surface requiring debridement ooThe left dorsal foot first/second webspace actually expanding and somewhat deeper. I may consider doing a shave biopsy of this area ooRight dorsal foot first/second webspace appears stable to improved. Using Lotrisone and silver alginate to both these areas 06/13/18 ooLeft gluteal surface looks better. Now separated in the 2 wounds. No debridement required. Still drainage. We'll continue silver alginate ooLeft heel continues to look better with Iodoflex continue this for at least another week ooOf his dorsal foot wounds the area on the left still has some depth although it looks better than last week. We've been using Lotrisone and silver alginate 06/20/18 ooLeft gluteal continues to look better healthy tissue ooLeft heel continues  to look better healthy granulation wound is smaller. He is using Iodoflex and his long as this continues continue the Iodoflex ooDorsal right foot looks better unfortunately dorsal left foot does not. There is swelling and erythema of his forefoot. He had minor trauma to this several days ago but doesn't think this was enough to have caused any tissue injury. Foot looks like cellulitis, we have had this problem before 06/27/18 on evaluation today patient appears to be  doing a little worse in regard to his foot ulcer. 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 Both of which would really be his only options for treating this infection. In the past he has been given and effusion of Orbactiv. This is done very well for him in the past again it's one time dosing IV antibiotic therapy. Subsequently I do believe this is something we're gonna need to [name] about doing at this point in time. Currently his other wounds seem to be doing somewhat better in my pinion I'm pretty happy in that regard. 07/03/18 on evaluation today patient's wounds actually appear to be doing fairly well. He has been tolerating the dressing changes without complication. All in all he seems to be showing signs of improvement. In regard to the antibiotics he has been dealing with infectious disease since I saw him last week as far as getting this scheduled. In the end [facility]'s [facility] this coming Friday. In the meantime he has been continuing to perform the dressing changes in such as previous. There does not appear to be any evidence of infection worsengin at this time. 07/10/18; ooSince I last saw this man 2 weeks ago things have actually improved. IV antibiotics of resulted in less forefoot erythema although there is still some present. He is not systemically unwell ooLeft buttock wounds o2 now have no depth there is increased epithelialization Using silver alginate ooLeft heel still requires debridement using Iodoflex ooLeft dorsal foot still with a sizable wound about the size of a border but healthy granulation ooRight dorsal foot still with a slitlike area using silver alginate 07/18/18; the patient's cellulitis in the left foot is improved in fact I think it is on its way to  resolving. ooLeft buttock wounds o2 both look better although the larger one has hypertension granulation we've been using silver alginate ooLeft heel has some thick circumferential redundant skin over the wound edge which will need to be removed today we've been using Iodoflex ooLeft dorsal foot is still a sizable wound required debridement using silver alginate ooThe right dorsal foot is just about closed only a small open area remains here 07/25/18; left foot cellulitis is resolved ooLeft buttock wounds o2 both look better. Hyper-granulation on the major area ooLeft heel as some debris over the surface but otherwise looks a healthier wound. Using silver collagen ooRight dorsal foot is just about closed 07/31/18; arrives with our intake nurse worried about purulent drainage from the buttock. We had hyper-granulation here last week ooHis buttock wounds o2 continue to look better ooLeft heel some debris over the surface but measuring smaller. ooRight dorsal foot unfortunately has openings between the toes ooLeft foot superficial wound looks less aggravated. 08/07/18 ooButtock wounds continue to look better although some of her granulation and the larger medial wound. silver alginate ooLeft heel  continues to look a lot better.silver collagen ooLeft foot superficial wound looks less stable. Requires debridement. He has a new wound superficial area on the foot on the lateral dorsal foot. ooRight foot looks better using silver alginate without Lotrisone 08/14/2018; patient was in the ER last week diagnosed with a UTI. He is now on Cefpodoxime and Macrodantin. ooButtock wounds continued to be smaller. Using silver alginate ooLeft heel continues to look better using silver collagen ooLeft foot superficial wound looks as though it is improving ooRight dorsal foot area is just about healed. 08/21/2018; patient is completed his antibiotics for his UTI. ooHe has 2 open areas on  the buttocks. There is still not closed although the surface looks satisfactory. Using silver alginate ooLeft heel continues to improve using silver collagen ooThe bilateral dorsal foot areas which are at the base of his first and second toes/possible tinea pedis are actually stable on the left but worse on the right. The area on the left required debridement of necrotic surface. After debridement I obtained a specimen for PCR culture. ooThe right dorsal foot which is been just about healed last week is now reopened 08/28/2018; culture done on the left dorsal foot showed coag negative staph both staph epidermidis and Lugdunensis. I think this is worthwhile initiating systemic treatment. I will use doxycycline given his long list of allergies. The area on the left heel slightly improved but still requiring debridement. ooThe large wound on the buttock is just about closed whereas the smaller one is larger. Using silver alginate in this area 09/04/2018; patient is completing his doxycycline for the left foot although this continues to be a very difficult wound area with very adherent necrotic debris. We are using silver alginate to all his wounds right foot left foot and the small wounds on his buttock, silver collagen on the left heel. 09/11/2018; once again this patient has intense erythema and swelling of the left forefoot. Lesser degrees of erythema in the right foot. He has a long list of allergies and intolerances. I will reinstitute doxycycline. 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 Using silver alginate ooLeft heel also looks better using silver collagen ooUnfortunately both the areas on his feet look worse. The area on the left first second webspace is now gone through to the plantar part of his foot. The area on the left foot anteriorly is irritated with erythema and swelling in the forefoot. 09/25/2018 ooHis wound on the  left plantar heel looks better. Using silver collagen ooThe area on the left buttock 2 small remnant areas. One is closed one is still open. Using silver alginate ooThe areas between both his first and second toes look worse. This in spite of long-standing antifungal therapy with ketoconazole and silver alginate which should have antifungal activity ooHe has small areas around his original wound on the left calf one is on the bottom of the original scar tissue and one superiorly both of these are small and superficial but again given wound history in this site this is worrisome 10/02/2018 ooLeft plantar heel continues to gradually contract using silver collagen ooLeft buttock wound is unchanged using silver alginate ooThe areas on his dorsal feet between his first and second toes bilaterally look about the same. 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 Small open area here. We have been using silver alginate 10/09/2018; ooLeft plantar heel continues to look healthy and contract. No debridement is required ooLeft buttock slightly smaller with a tape injury wound just below which was new this week ooDorsal feet somewhat improved I have been using clindamycin ooLeft lateral looks lower extremity the actual open area looks worse although a lot of this is epithelialized. I am going to change to silver collagen today He has a lot more swelling in the right leg although this is not pitting not red and not particularly warm there is a lot of spasm in the right leg usually indicative of people with paralysis of some underlying discomfort. We have reviewed his vascular status from 2017 he had a left greater saphenous vein ablation. I wonder about referring him back to vascular surgery if the area on the left leg  continues to deteriorate. 10/16/2018 in today for follow-up and management of multiple lower extremity ulcers. His left Buttock wound is much lower smaller and almost closed completely. The wound to the left ankle has began to reopen with Epithelialization and some adherent slough. He has multiple new areas to the left foot and leg. The left dorsal foot without much improvement. Wound present between left great webspace and 2nd toe. Erythema and edema present right leg. Right LE ultrasound obtained on 10/10/18 was negative for DVT. 10/23/2018; ooLeft buttock is closed over. Still dry macerated skin but there is no open wound. I suspect this is chronic pressure/moisture ooLeft lateral calf is quite a bit worse than when I saw this last. There is clearly drainage here he has macerated skin into the left plantar heel. We will change the primary dressing to alginate ooLeft dorsal foot has some improvement in overall wound area. Still using clindamycin and silver alginate ooRight dorsal foot about the same as the left using clindamycin and silver alginate ooThe erythema in the right leg has resolved. He is DVT rule out was negative ooLeft heel pressure area required debridement although the wound is smaller and the surface is health 10/26/2018 ooThe patient came back in for his nurse check today predominantly because of the drainage coming out of the left lateral leg with a recent reopening of his original wound on the left lateral calf. He comes in today with a large amount of surrounding erythema around the wound extending from the calf into the ankle and even in the area on the dorsal foot. He is not systemically unwell. He is not febrile. Nevertheless this looks like cellulitis. We have been using silver alginate to the area. I changed him to a regular visit and I am going to prescribe him doxycycline. The rationale here is a long list of medication intolerances and a history of MRSA. I did  not [name] anything that I thought would provide a valuable culture 10/30/2018 ooFollow-up from his appointment 4 days ago with really an extensive area of cellulitis in the left calf left lateral ankle and left dorsal foot. I put him on doxycycline. He has a long list of medication allergies which are true allergy reactions. 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 In any case he is a lot better today. The erythema especially in the anterior and lateral left calf is better. He still has left ankle erythema. He also is complaining about increasing edema in the right leg we have only been using Kerlix Coban and he has been  doing the wraps at home. Finally he has a spotty rash on the medial part of his upper left calf which looks like folliculitis or perhaps wrap occlusion type injury. Small superficial macules not pustules 11/06/18 patient arrives today with again a considerable degree of erythema around the wound on the left lateral calf extending into the dorsal ankle and dorsal foot. This is a lot worse than when I saw this last week. He is on doxycycline really with not a lot of improvement. He has not been systemically unwell Wounds on the; left heel actually looks improved. Original area on the left foot and proximity to the first and second toes looks about the same. He has superficial areas on the dorsal foot, anterior calf and then the reopening of his original wound on the left lateral calf which looks about the same ooThe only area he has on the right is the dorsal webspace first and second which is smaller. ooHe has a large area of dry erythematous skin on the left buttock small open area here. 11/13/2018; the patient arrives in much better condition. The erythema around the wound on the left lateral calf is a lot better. Not sure whether this was the clindamycin or the TCA and ketoconazole or just in the improvement  in edema control [stasis dermatitis]. In any case this is a lot better. The area on the left heel is very small and just about resolved using silver collagen we have been using silver alginate to the areas on his dorsal feet 11/20/2018; his wounds include the left lateral calf, left heel, dorsal aspects of both feet just proximal to the first second webspace. He is stable to slightly improved. I did not think any changes to his dressings were going to be necessary 11/27/2018 he has a reopening on the left buttock which is surrounded by what looks like tinea or perhaps some other form of dermatitis. The area on the left dorsal foot has some erythema around it I have marked this area but I am not sure whether this is cellulitis or not. Left heel is not closed. Left calf the reopening is really slightly longer and probably worse 1/13; in general things look better and smaller except for the left dorsal foot. Area on the left heel is just about closed, left buttock looks better only a small wound remains in the skin looks better [using Lotrisone] 1/20; the area on the left heel only has a few remaining open areas here. Left lateral calf about the same in terms of size, left dorsal foot slightly larger right lateral foot still not closed. The area on the left buttock has no open wound and the surrounding skin looks a lot better 1/27; the area on the left heel is closed. Left lateral calf better but still requiring extensive debridements. The area on his left buttock is closed. He still has the open areas on the left dorsal foot which is slightly smaller in the right foot which is slightly expanded. We have been using Iodoflex on these areas as well 2/3; left heel is closed. Left lateral calf still requiring debridement using Iodoflex there is no open area on his left buttock however he has dry scaly skin over a large area of this. Not really responding well to the Lotrisone. Finally the areas on his  dorsal feet at the level of the first second webspace are slightly smaller on the right and about the same on the left. Both of these vigorously debrided with  Anasept and gauze 2/10; left heel remains closed he has dry erythematous skin over the left buttock but there is no open wound here. Left lateral leg has come in and with. Still requiring debridement we have been using Iodoflex here. Finally the area on the left dorsal foot and right dorsal foot are really about the same extremely dry callused fissured areas. He does not yet have a dermatology appointment 2/17; left heel remains closed. He has a new open area on the left buttock. The area on the left lateral calf is bigger longer and still covered in necrotic debris. No major change in his foot areas bilaterally. I am awaiting for a dermatologist to look on this. We have been using ketoconazole I do not know that this is been doing any good at all. 2/24; left heel remains closed. The left buttock wound that was new reopening last week looks better. The left lateral calf appears better also although still requires debridement. The major area on his foot is the left first second also requiring debridement. We have been putting Prisma on all wounds. I do not believe that the ketoconazole has done too much good for his feet. He will use Lotrisone I am going to give him a 2-week course of terbinafine. We still do not have a dermatology appointment 3/2 left heel remains closed however there is skin over bone in this area I pointed this out to him today. The left buttock wound is epithelialized but still does not look completely stable. The area on the left leg required debridement were using silver collagen here. With regards to his feet we changed to Lotrisone last week and silver alginate. 3/9; left heel remains closed. Left buttock remains closed. The area on the right foot is essentially closed. The left foot remains unchanged. Slightly  smaller on the left lateral calf. Using silver collagen to both of these areas 3/16-Left heel remains closed. Area on right foot is closed. Left lateral calf above the lateral malleolus open wound requiring debridement with easy bleeding. Left dorsal wound proximal to first toe also debrided. Left ischial area open new. Patient has been using Prisma with wrapping every 3 days. Dermatology appointment is apparently tomorrow.Patient has completed his terbinafine 2-week course with some apparent improvement according to him, there is still flaking and dry skin in his foot on the left 3/23; area on the right foot is reopened. The area on the left anterior foot is about the same still a very necrotic adherent surface. He still has the area on the left leg and reopening is on the left buttock. He apparently saw dermatology although I do not have a note. According to the patient who is usually fairly well informed they did not have any good ideas. Put him on oral terbinafine which she is been on before. 3/30; using silver collagen to all wounds. Apparently his dermatologist put him on doxycycline and rifampin presumably some culture grew staph. I do not have this result. He remains on terbinafine although I have used terbinafine on him before 4/6; patient has had a fairly substantial reopening on the right foot between the first and second toes. He is finished his terbinafine and I believe is on doxycycline and rifampin still as prescribed by dermatology. We have been using silver collagen to all his wounds although the patient reports that he thinks silver alginate does better on the wounds on his buttock. 4/13; the area on his left lateral calf about the same size but  it did not require debridement. ooLeft dorsal foot just proximal to the webspace between the first and second toes is about the same. Still nonviable surface. I note some superficial bronze discoloration of the dorsal part of his  foot ooRight dorsal foot just proximal to the first and second toes also looks about the same. I still think there may be the same discoloration I noted above on the left ooLeft buttock wound looks about the same 4/20; left lateral calf appears to be gradually contracting using silver collagen. 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 The left dorsal foot wound is debrided of tightly adherent necrotic debris and really cleans up quite nicely. The right area is worse with expansion. I did not debride this it is now over the base of the second toe ooThe area on his left buttock is smaller no debridement is required using silver collagen 5/4; left calf continues to make good progress. ooHe arrives with erythema around the wounds on his dorsal foot which even extends to the plantar aspect. Very concerning for coexistent infection. He is finished the erythromycin I gave him for possible erythrasma this does not seem to have helped. ooThe area on the left foot is about the same base of the dorsal toes ooIs area on the buttock looks improved on the left 5/11; left calf and left buttock continued to make good progress. Left foot is about the same to slightly improved. ooMajor problem is on the right foot. He has not had an x-ray. Deep tissue culture I did last week showed both Enterobacter and E. coli. I did not change the doxycycline I put him on empirically although neither 1 of these were plated to doxycycline. He arrives today with the erythema looking worse on both the dorsal and plantar foot. Macerated skin on the bottom of the foot. he has not been systemically unwell 5/18-Patient returns at 1 week, left calf wound appears to be making some progress, left buttock wound appears slightly worse than last time, left foot wound looks slightly better, right foot redness is marginally better. X-ray of both feet show no air or evidence  of osteomyelitis. Patient is finished his Omnicef and terbinafine. He continues to have macerated skin on the bottom of the left foot as well as right 5/26; left calf wound is better, left buttock wound appears to have multiple small superficial open areas with surrounding macerated skin. X-rays that I did last time showed no evidence of osteomyelitis in either foot. He is finished cefdinir and doxycycline. I do not think that he was on terbinafine. He continues to have a large superficial open area on the right foot anterior dorsal and slightly between the first and second toes. I did send him to dermatology 2 months ago or so wondering about whether they would do a fungal scraping. I do not believe they did but did do a culture. We have been using silver alginate to the toe areas, he has been using antifungals at home topically either ketoconazole or Lotrisone. We are using silver collagen on the left foot, silver alginate on the right, silver collagen on the left lateral leg and silver alginate on the left buttock 6/1; left buttock area is healed. We have the left dorsal foot, left lateral leg and right dorsal foot. We are using silver alginate to the areas on both feet and silver collagen to the area on his left lateral calf 6/8; the left buttock apparently reopened late  last week. He is not really sure how this happened. He is tolerating the terbinafine. Using silver alginate to all wounds 6/15; left buttock wound is larger than last week but still superficial. ooCame in the clinic today with a report of purulence from the left lateral leg I did not identify any infection ooBoth areas on his dorsal feet appear to be better. He is tolerating the terbinafine. Using silver alginate to all wounds 6/22; left buttock is about the same this week, left calf quite a bit better. His left foot is about the same however he comes in with erythema and warmth in the right forefoot once again. Culture that  I gave him in the beginning of May showed Enterobacter and E. coli. I gave him doxycycline and things seem to improve although neither 1 of these organisms was specifically plated. 6/29; left buttock is larger and dry this week. Left lateral calf looks to me to be improved. Left dorsal foot also somewhat improved right foot completely unchanged. The erythema on the right foot is still present. He is completing the Ceftin dinner that I gave him empirically [[name] discussion above.) 7/6 - All wounds look to be stable and perhaps improved, the left buttock wound is slightly smaller, per patient bleeds easily, completed ceftin, the right foot redness is less, he is on terbinafine 7/13; left buttock wound about the same perhaps slightly narrower. Area on the left lateral leg continues to narrow. Left dorsal foot slightly smaller right foot about the same. We are using silver alginate on the right foot and Hydrofera Blue to the areas on the left. Unna boot on the left 2 layer compression on the right 7/20; left buttock wound absolutely the same. Area on lateral leg continues to get better. Left dorsal foot require debridement as did the right no major change in the 7/27; left buttock wound the same size necrotic debris over the surface. The area on the lateral leg is closed once again. His left foot looks better right foot about the same although there is some involvement now of the posterior first second toe area. He is still on terbinafine which I have given him for a month, not certain a centimeter major change 06/25/19-All wounds appear to be slightly improved according to report, left buttock wound looks clean, both foot wounds have minimal to no debris the right dorsal foot has minimal slough. We are using Hydrofera Blue to the left and silver alginate to the right foot and ischial wound. 8/10-Wounds all appear to be around the same, the right forefoot distal part has some redness which was not  there before, however the wound looks clean and small. Ischial wound looks about the same with no changes 8/17; his wound on the left lateral calf which was his original chronic venous insufficiency wound remains closed. Since I last saw him the areas on the left dorsal foot right dorsal foot generally appear better but require debridement. The area on his left initial tuberosity appears somewhat larger to me perhaps hyper granulated and bleeds very easily. We have been using Hydrofera Blue to the left dorsal foot and silver alginate to everything else 8/24; left lateral calf remains closed. The areas on his dorsal feet on the webspace of the first and second toes bilaterally both look better. The area on the left buttock which is the pressure ulcer stage II slightly smaller. I change the dressing to Hydrofera Blue to all areas 8/31; left lateral calf remains closed. The area  on his dorsal feet bilaterally look better. Using Hydrofera Blue. Still requiring debridement on the left foot. No change in the left buttock pressure ulcers however 9/14; left lateral calf remains closed. Dorsal feet look quite a bit better than 2 weeks ago. Flaking dry skin also a lot better with the ammonium lactate I gave him 2 weeks ago. The area on the left buttock is improved. He states that his Roho cushion developed a leak and he is getting a new one, in the interim he is offloading this vigorously 9/21; left calf remains closed. Left heel which was a possible DTI looks better this week. He had macerated tissue around the left dorsal foot right foot looks satisfactory and improved left buttock wound. I changed his dressings to his feet to silver alginate bilaterally. Continuing Hydrofera Blue on the left buttock. 9/28 left calf remains closed. Left heel did not develop anything [possible DTI] dry flaking skin on the left dorsal foot. Right foot looks satisfactory. Improved left buttock wound. We are using silver  alginate on his feet Hydrofera Blue on the buttock. I have asked him to go back to the Lotrisone on his feet including the wounds and surrounding areas 10/5; left calf remains closed. The areas on the left and right feet about the same. A lot of this is epithelialized however debris over the remaining open areas. He is using Lotrisone and silver alginate. The area on the left buttock using Hydrofera Blue 10/26. Patient has been out for 3 weeks secondary to Covid concerns. He tested negative but I think his wife tested positive. He comes in today with the left foot substantially worse, right foot about the same. Even more concerning he states that the area on his left buttock closed over but then reopened and is considerably deeper in one aspect than it was before [stage III wound] 11/2; left foot really about the same as last week. Quarter sized wound on the dorsal foot just proximal to the first second toes. Surrounding erythema with areas of denuded epithelium. This is not really much different looking. Did not look like cellulitis this time however. ooRight foot area about the same.. We have been using silver alginate alginate on his toes ooLeft buttock still substantial irritated skin around the wound which I think looks somewhat better. We have been using Hydrofera Blue here. 11/9; left foot larger than last week and a very necrotic surface. Right foot I think is about the same perhaps slightly smaller. Debris around the circumference also addressed. Unfortunately on the left buttock there is been a decline. Satellite lesions below the major wound distally and now a an additional one posteriorly we have been using Hydrofera Blue but I think this is a pressure issue 11/16; left foot ulcer dorsally again a very adherent necrotic surface. Right foot is about the same. Not much change in the pressure ulcer on his left buttock. 11/30; left foot ulcer dorsally basically the same as when I saw  him 2 weeks ago. Very adherent fibrinous debris on the wound surface. Patient reports a lot of drainage as well. 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 We have been using Iodoflex, patient changed back to alginate because of the drainage. Area on his right dorsal foot really looks benign with a healthier surface certainly a lot better than on the left. Left buttock wounds all improved using Hydrofera Blue 12/7; left dorsal foot again no improvement. Tightly adherent debris. PCR culture  I did last week only showed likely skin contaminant. I have gone ahead and done a punch biopsy of this which is about the last thing in terms of investigations I can think to do. He has known venous insufficiency and venous hypertension and this could be the issue here. The area on the right foot is about the same left buttock slightly worse according to our intake nurse secondary to [facility] Blue sticking to the wound 12/14; biopsy of the left foot that I did last time showed changes that could be related to wound healing/chronic stasis dermatitis phenomenon no neoplasm. We have been using silver alginate to both feet. I change the one on the left today to Sorbact and silver alginate to his other 2 wounds 12/28; the patient arrives with the following problems; ooMajor issue is the dorsal left foot which continues to be a larger deeper wound area. Still with a completely nonviable surface ooParadoxically the area mirror image on the right on the right dorsal foot appears to be getting better. ooHe had some loss of dry denuded skin from the lower part of his original wound on the left lateral calf. Some of this area looked a little vulnerable and for this reason we put him in wrap that on this side this week ooThe area on his left buttock is larger. He still has the erythematous circular area which I think is a combination of pressure, sweat. This does not  look like cellulitis or fungal dermatitis 11/26/2019; -Dorsal left foot large open wound with depth. Still debris over the surface. Using Sorbact ooThe area on the dorsal right foot paradoxically has closed over [facility] has a reopening on the left ankle laterally at the base of his original wound that extended up into the calf. This appears clean. ooThe left buttock wound is smaller but with very adherent necrotic debris over the surface. We have been using silver alginate here as well The patient had arterial studies done in 2017. He had biphasic waveforms at the dorsalis pedis and posterior tibial bilaterally. ABI in the left was 1.17. Digit waveforms were dampened. He has slight spasticity in the great toes I do not think a TBI would be possible 1/11; the patient comes in today with a sizable reopening between the first and second toes on the right. This is not exactly in the same location where we have been treating wounds previously. According to our intake nurse this was actually fairly deep but 0.6 cm. The area on the left dorsal foot looks about the same the surface is somewhat cleaner using Sorbact, his MRI is in 2 days. We have not managed yet to get arterial studies. The new reopening on the left lateral calf looks somewhat better using alginate. The left buttock wound is about the same using alginate 1/18; the patient had his ARTERIAL studies which were quite normal. ABI in the right at 1.13 with triphasic/biphasic waveforms on the left ABI 1.06 again with triphasic/biphasic waveforms. It would not have been possible to have done a toe brachial index because of spasticity. We have been using Sorbac to the left foot alginate to the rest of his wounds on the right foot left lateral calf and left buttock 1/25; arrives in clinic with erythema and swelling of the left forefoot worse over the first MTP area. This extends laterally dorsally and but also posteriorly. Still has an area on the  left lateral part of the lower part of his calf wound it is eschared and  clearly not closed. ooArea on the left buttock still with surrounding irritation and erythema. ooRight foot surface wound dorsally. The area between the right and first and second toes appears better. 2/1; ooThe left foot wound is about the same. Erythema slightly better I gave him a week of doxycycline empirically ooRight foot wound is more extensive extending between the toes to the plantar surface ooLeft lateral calf really no open surface on the inferior part of his original wound however the entire area still looks vulnerable ooAbsolutely no improvement in the left buttock wound required debridement. 2/8; the left foot is about the same. Erythema is slightly improved I gave him clindamycin last week. ooRight foot looks better he is using Lotrimin and silver alginate ooHe has a breakdown in the left lateral calf. Denuded epithelium which I have removed ooLeft buttock about the same were using Hydrofera Blue 2/15; left foot is about the same there is less surrounding erythema. Surface still has tightly adherent debris which I have debriding however not making any progress ooRight foot has a substantial wound on the medial right second toe between the first and second webspace. ooStill an open area on the left lateral calf distal area. ooButtock wound is about the same 2/22; left foot is about the same less surrounding erythema. Surface has adherent debris. Polymen Ag Right foot area significant wound between the first and second toes. We have been using silver alginate here Left lateral leg polymen Ag at the base of his original venous insufficiency wound ooLeft buttock some improvement here 3/1; ooRight foot is deteriorating in the first second toe webspace. Larger and more substantial. We have been using silver alginate. ooLeft dorsal foot about the same markedly adherent surface debris using PolyMem  Ag ooLeft lateral calf surface debris using PolyMem AG ooLeft buttock is improved again using PolyMem Ag. ooHe is completing his terbinafine. The erythema in the foot seems better. He has been on this for 2 weeks 3/8; no improvement in any wound area in fact he has a small open area on the dorsal midfoot which is new this week. He has not gotten his foot x-rays yet 3/15; his x-rays were both negative for osteomyelitis of both feet. No major change in any of his wounds on the extremities however his buttock wounds are better. We have been using polymen on the buttocks, left lower leg. Iodoflex on the left foot and silver alginate on the right 3/22; arrives in clinic today with the 2 major issues are the improvement in the left dorsal foot wound which for once actually looks healthy with a nice healthy wound surface without debridement. Using Iodoflex here. Unfortunately on the left lateral calf which is in the distal part of his original wound he came to the clinic here for there was purulent drainage noted some increased breakdown scattered around the original area and a small area proximally. We we are using polymen here will change to silver alginate today. His buttock wound on the left is better and I think the area on the right first second toe webspace is also improved 3/29; left dorsal foot looks better. Using Iodoflex. Left ankle culture from deterioration last time grew E. coli, Enterobacter and Enterococcus. I will give him a course of cefdinir although that will not cover Enterococcus. The area on the right foot in the webspace of the first and second toe lateral first toe looks better. The area on his buttock is about healed Vascular appointment is on April 21. This  is to look at his venous system vis--vis continued breakdown of the wounds on the left including the left lateral leg and left dorsal foot he. He has had previous ablations on this side 4/5; the area between the right  first and second toes lateral aspect of the first toe looks better. Dorsal aspect of the left first toe on the left foot also improved. Unfortunately the left lateral lower leg is larger and there is a second satellite wound superiorly. The usual superficial abrasions on the left buttock overall better but certainly not closed 4/12; the area between the right first and second toes is improved. Dorsal aspect of the left foot also slightly smaller with a vibrant healthy looking surface. No real change in the left lateral leg and the left buttock wound is healed He has an unaffordable co-pay for Apligraf. Appointment with vein and vascular with regards to the left leg venous part of the circulation is on 4/21 Objective Constitutional Sitting or standing Blood Pressure is within target range for patient.. Pulse regular and within target range for patient.Marland Kitchen Respirations regular, non-labored and within target range.. Temperature is normal and within the target range for the patient.Marland Kitchen Appears in no distress. Vitals Time Taken: 7:47 AM, Height: 70 in, Weight: 216 lbs, BMI: 31, Temperature: 98.2 F, Pulse: 92 bpm, Respiratory Rate: 20 breaths/min, Blood Pressure: 115/60 mmHg. General Notes: Wound examoothe area on the left foot appears to have healthy granulation slightly smaller but about the same depth ooIn the first and second toes of the right foot the wound is against the lateral part of the first toe much smaller almost looks like it is epithelializing ooLeft lateral lower leg distally about the same ooThe area on the left buttock is closed Integumentary (Hair, Skin) Wound #24 status is Open. Original cause of wound was Gradually Appeared. The wound is located on the Left,Dorsal Foot. The wound measures 2.1cm length x 2cm width x 0.3cm depth; 3.299cm^2 area and 0.99cm^3 volume. There is Fat Layer (Subcutaneous Tissue) Exposed exposed. There is no tunneling or undermining noted. There is a  medium amount of serosanguineous drainage noted. The wound margin is flat and intact. There is large (67-100%) red granulation within the wound bed. There is no necrotic tissue within the wound bed. Wound #35 status is Open. Original cause of wound was Gradually Appeared. The wound is located on the Left Ischium. The wound measures 0cm length x 0cm width x 0cm depth; 0cm^2 area and 0cm^3 volume. There is no tunneling or undermining noted. There is a none present amount of drainage noted. The wound margin is flat and intact. There is no granulation within the wound bed. There is no necrotic tissue within the wound bed. Wound #37 status is Open. Original cause of wound was Gradually Appeared. The wound is located on the Left,Lateral Malleolus. The wound measures 3.5cm length x 1.5cm width x 0.3cm depth; 4.123cm^2 area and 1.237cm^3 volume. There is Fat Layer (Subcutaneous Tissue) Exposed exposed. There is no tunneling or undermining noted. There is a medium amount of purulent drainage noted. The wound margin is flat and intact. There is large (67-100%) red granulation within the wound bed. There is a small (1-33%) amount of necrotic tissue within the wound bed including Adherent Slough. Wound #38 status is Open. Original cause of wound was Gradually Appeared. The wound is located on the Right Toe - Web between 1st and 2nd. The wound measures 0.6cm length x 0.5cm width x 0.1cm depth; 0.236cm^2 area and 0.024cm^3  volume. There is Fat Layer (Subcutaneous Tissue) Exposed exposed. There is no tunneling or undermining noted. There is a small amount of serosanguineous drainage noted. The wound margin is flat and intact. There is medium (34-66%) red granulation within the wound bed. There is a medium (34-66%) amount of necrotic tissue within the wound bed including Adherent Slough. Wound #40 status is Open. Original cause of wound was Gradually Appeared. The wound is located on the Left,Lateral Lower Leg.  The wound measures 1cm length x 0.4cm width x 0.2cm depth; 0.314cm^2 area and 0.063cm^3 volume. There is Fat Layer (Subcutaneous Tissue) Exposed exposed. There is no tunneling or undermining noted. There is a small amount of serosanguineous drainage noted. The wound margin is flat and intact. There is large (67-100%) pink granulation within the wound bed. There is no necrotic tissue within the wound bed. 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 There was a Four Layer Compression Therapy Procedure by [name] Hurst, [name]. Post procedure Diagnosis Wound #37: Same as Pre-Procedure Plan Follow-up Appointments: Return Appointment in 1 week. Dressing Change Frequency: Wound #24 Left,Dorsal Foot: Do not change entire dressing for one week. Wound #37 Left,Lateral Malleolus: Do not change entire dressing for one week. Wound #38 Right Toe - Web between 1st and 2nd: Change Dressing every other day. Wound #40 Left,Lateral Lower Leg: Do not change entire dressing for one week. Skin Barriers/Peri-Wound Care: Antifungal cream - on toes on both feet daily Barrier cream - to leg/ankle and left foot Moisturizing lotion - both legs Other: - Triamcinolone cream Primary Wound Dressing: Wound #24 Left,Dorsal Foot: Iodoflex Wound #37 Left,Lateral Malleolus: Calcium Alginate with Silver Wound #38 Right Toe - Web between 1st and 2nd: Calcium Alginate with Silver Wound #40 Left,Lateral Lower Leg: Calcium  Alginate with Silver Secondary Dressing: Wound #24 Left,Dorsal Foot: Dry Gauze Wound #37 Left,Lateral Malleolus: Dry Gauze ABD pad Wound #38 Right Toe - Web between 1st and 2nd: Kerlix/Rolled Gauze - secure with tape Dry Gauze Wound #40 Left,Lateral Lower Leg: Dry Gauze ABD pad Edema Control: 4 layer compression: Left lower extremity Elevate legs to the level of the heart or above for 30 minutes daily and/or when sitting, a frequency of: - throughout the day Support Garment 30-40 mm/Hg pressure to: - Juxtalite to right leg Off-Loading: Low air-loss mattress (Group 2) Roho cushion for wheelchair Turn and reposition every 2 hours - out of wheelchair throughout the day, try to lay on sides, sleep in the bed not the recliner 1. Continue with silver alginate on his right foot and Iodoflex on the left foot. Both of these have some improvement 2. Silver alginate to the left lateral leg. I am going to wait until we hear an opinion about vein and vascular here. He has had previous ablations in the left leg done years ago. I am thinking this may be significant venous hypertension that causes continuous breakdown in the left lateral lower leg and perhaps contributing to the open wound on the left dorsal foot 3. His left buttock is closed he still has the irritated  surrounding skin which I think is maceration issue. He does not have this on the other side. Electronic Signature(s) Signed: 03/04/2020 5:51:00 PM By: [name] Ham MD Entered By: [name] on 03/03/2020 08:59:29 -------------------------------------------------------------------------------- SuperBill Details Patient Name: Date of Service: [place], [name] 03/03/2020 Medical Record [id number] Patient Account Number: [id number] Date of Birth/Sex: Treating [name]: 1986-05-12 (34 y.o. [name] Primary Care Provider: [place], [place] Other Clinician: Referring Provider: Treating Provider/Extender:Pat Sires,  [name], [name] [name]: 217 Diagnosis Coding ICD-10 Codes Code Description I87.332 Chronic venous hypertension (idiopathic) with ulcer and inflammation of left lower extremity L97.321 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 to breakdown of skin L97.521 Non-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 L97.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, 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, complete Facility Procedures CPT4 Code Description: [id number] (Facility Use Only) [phone] - APPLY MULTLAY COMPRS LWR LT LEG Modifier: Quantity: 1 Physician Procedures CPT4 Code Description: [id number] - WC PHYS LEVEL 3 - EST PT ICD-10 Diagnosis Description L97.321 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 to bre L97.521 Non-pressure chronic ulcer of other part of left foot l L97.511 Non-pressure chronic  ulcer of other part of right foot Modifier: akdown of skin imited to breakdo limited to breakd Quantity: 1 wn of skin own of skin Electronic Signature(s) Signed: 03/04/2020 5:51:00 PM By: [name] Ham MD Entered By: [name] on 03/03/2020 09:00:06

## 2020-03-05 ENCOUNTER — Other Ambulatory Visit: Payer: Self-pay | Admitting: *Deleted

## 2020-03-05 DIAGNOSIS — S81802A Unspecified open wound, left lower leg, initial encounter: Secondary | ICD-10-CM

## 2020-03-05 NOTE — Progress Notes (Signed)
[name], [name] ([id number]) Visit Report for 03/03/2020 Arrival Information Details Patient Name: Date of Service: [name], [name] 03/03/2020 7:30 AM Medical Record [id number] Patient Account Number: [id number] Date of Birth/Sex: Treating RN: [birth year] (34 y.o. [name] Primary Care Darionna Banke: [place], [place] Other Clinician: Referring Frankie Scipio: Treating Gedalia Mcmillon/Extender:Robson, [name], [name]: 217 Visit Information History Since Last Visit Added or deleted any medications: No Patient Arrived: Wheel Chair Any new allergies or adverse reactions: No Arrival Time: 07:46 Had a fall or experienced change in No activities of daily living that may affect Accompanied By: self risk of falls: Transfer Assistance: None Signs or symptoms of abuse/neglect since last No Patient Identification Verified: Yes visito Secondary Verification Process Completed: Yes Hospitalized since last visit: No Patient Requires Transmission-Based No Implantable device outside of the clinic excluding No Precautions: cellular tissue based products placed in the center Patient Has Alerts: Yes since last visit: Patient Alerts: R ABI = Has Dressing in Place as Prescribed: Yes 1.0 Pain Present Now: No L ABI = 1.1 Electronic Signature(s) Signed: 03/05/2020 9:19:04 AM By: Sandre Kitty Entered By: Sandre Kitty on 03/03/2020 07:47:14 -------------------------------------------------------------------------------- Compression Therapy Details Patient Name: Date of Service: [name], [name] 03/03/2020 7:30 AM Medical Record [id number] Patient Account Number: [id number] Date of Birth/Sex: Treating RN: [birth year] (34 y.o. [name] Primary Care Oliwia Berzins: [place], [employer] Other Clinician: Referring Tazaria Dlugosz: Treating Mieshia Pepitone/Extender:Robson, [name], [name]: 217 Compression Therapy Performed for Wound Wound #37 Left,Lateral  Malleolus Assessment: Performed By: Clinician Levan Hurst, RN Compression Type: Four Layer Post Procedure Diagnosis Same as Pre-procedure Electronic Signature(s) Signed: 03/03/2020 6:12:10 PM By: Levan Hurst RN, BSN Entered By: Levan Hurst on 03/03/2020 08:18:48 -------------------------------------------------------------------------------- Encounter Discharge Information Details Patient Name: Date of Service: [name], [name] 03/03/2020 7:30 AM Medical Record [id number] Patient Account Number: [id number] Date of Birth/Sex: Treating RN: Aug 06, 1986 (34 y.o. [name] Primary Care Bernedette Auston: [name], [name] Other Clinician: Referring Cameron Schwinn: Treating Mahrosh Donnell/Extender:Robson, [name], [name]: 217 Encounter Discharge Information Items Discharge Condition: Stable Ambulatory Status: Wheelchair Discharge Destination: Home Transportation: Private Auto Accompanied By: self Schedule Follow-up Appointment: Yes Clinical Summary of Care: Electronic Signature(s) Signed: 03/03/2020 5:40:16 PM By: Deon Pilling Entered By: Deon Pilling on 03/03/2020 08:34:02 -------------------------------------------------------------------------------- Lower Extremity Assessment Details Patient Name: Date of Service: [name], [name] 03/03/2020 7:30 AM Medical Record [id number] Patient Account Number: [id number] Date of Birth/Sex: Treating RN: [birth year] (34 y.o. [name] Primary Care Carinna Newhart: [place], [place] Other Clinician: Referring [place]: Treating Eldana Isip/Extender:Robson, [name], [name]: 217 Edema Assessment Assessed: [Left: No] [Right: No] Edema: [Left: Yes] [Right: Yes] Calf Left: Right: Point of Measurement: 33 cm From Medial Instep 27.5 cm 32.5 cm Ankle Left: Right: Point of Measurement: 10 cm From Medial Instep 23.5 cm 23.6 cm Vascular Assessment Pulses: Dorsalis Pedis Palpable: [Left:Yes]  [Right:Yes] Electronic Signature(s) Signed: 03/03/2020 5:39:59 PM By: Baruch Gouty RN, BSN Entered By: Baruch Gouty on 03/03/2020 08:05:15 -------------------------------------------------------------------------------- Multi Wound Chart Details Patient Name: Date of Service: [name] 03/03/2020 7:30 AM Medical Record [id number] Patient Account Number: [id number] Date of Birth/Sex: Treating RN: [birth year] (34 y.o. [name] Primary Care Sheridan Hew: [name], [name] Other Clinician: Referring Antuane Eastridge: Treating Danni Leabo/Extender:Robson, [name], [name]: 217 Vital Signs Height(in): 70 Pulse(bpm): 92 Weight(lbs): 216 Blood Pressure(mmHg): 115/60 Body Mass Index(BMI): 31 Temperature(F): 98.2 Respiratory 20 Rate(breaths/min): Photos: [24:No Photos] [35:No Photos] [37:No Photos] Wound Location: [24:Left, Dorsal Foot] [35:Left Ischium] [37:Left, Lateral Malleolus] Wounding Event: [24:Gradually  Appeared] [35:Gradually Appeared] [37:Gradually Appeared] Primary Etiology: [24:Inflammatory] [35:Pressure Ulcer] [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] Secondary Etiology: [24:N/A] [35:N/A] [37:N/A] Comorbid History: [24:Sleep Apnea, Hypertension, Sleep Apnea, Hypertension, Sleep Apnea, Hypertension, Paraplegia] [35:Paraplegia] [37:Paraplegia] Date Acquired: [24:03/08/2018] [35:04/28/2019] [37:11/26/2019] Weeks of Treatment: [id number] [35:44] [37:14] Wound Status: [24:Open] [35:Open] [37:Open] Clustered Wound: [24:Yes] [35:Yes] [37:No] Clustered Quantity: [24:1] [35:2] [37:N/A] Measurements L x W x D 2.1x2x0.3 [35:0x0x0] [37:3.5x1.5x0.3] (cm) Area (cm) : [24:3.299] [35:0] [37:4.123] Volume (cm) : [24:0.99] [35:0] [37:1.237] % Reduction in Area: [24:-1297.90%] [35:100.00%] [37:-4.20%] % Reduction in Volume: [24:-4025.00%] [35:100.00%] [37:-212.40%] Classification: [24:Full Thickness Without Exposed Support Structures] [35:Category/Stage III] [37:Full Thickness  Without Exposed Support Structures] Exudate Amount: [24:Medium] [35:None Present] [37:Medium] Exudate Type: [24:Serosanguineous] [35:N/A] [37:Purulent] Exudate Color: [24:red, brown] [35:N/A] [37:yellow, brown, green] Wound Margin: [24:Flat and Intact] [35:Flat and Intact] [37:Flat and Intact] Granulation Amount: [24:Large (67-100%)] [35:None Present (0%)] [37:Large (67-100%)] Granulation Quality: [24:Red] [35:N/A] [37:Red] Necrotic Amount: [24:None Present (0%)] [35:None Present (0%)] [37:Small (1-33%)] Exposed Structures: [24:Fat Layer (Subcutaneous Fascia: No Tissue) Exposed: Yes Fascia: No Tendon: No Muscle: No Joint: No Bone: No] [35:Fat Layer (Subcutaneous Tissue) Exposed: No Tendon: No Muscle: No Joint: No Bone: No] [37:Fat Layer (Subcutaneous Tissue) Exposed: Yes  Fascia: No Tendon: No Muscle: No Joint: No Bone: No] Epithelialization: [24:Small (1-33%)] [35:Large (67-100%)] [37:Small (1-33%)] Procedures Performed: [24:N/A 38] [35:N/A 40] [37:Compression Therapy N/A] Photos: [24:No Photos] [35:No Photos] [37:N/A] Wound Location: [24:Right Toe - Web between Left, Lateral Lower Leg 1st and 2nd] [37:N/A] Wounding Event: [24:Gradually Appeared] [35:Gradually Appeared] [37:N/A] Primary Etiology: [24:Inflammatory] [35:Venous Leg Ulcer] [37:N/A] Secondary Etiology: [24:N/A] [35:Lymphedema] [37:N/A] Comorbid History: [24:Sleep Apnea, Hypertension, Sleep Apnea, Hypertension, N/A Paraplegia] [35:Paraplegia] Date Acquired: [24:11/30/2019] [35:02/11/2020] [37:N/A] Weeks of Treatment: [24:13] [35:3] [37:N/A] Wound Status: [24:Open] [35:Open] [37:N/A] Clustered Wound: [24:No] [35:No] [37:N/A] Clustered Quantity: [24:N/A] [35:N/A] [37:N/A] Measurements L x W x D 0.6x0.5x0.1 [35:1x0.4x0.2] [37:N/A] (cm) Area (cm) : [24:0.236] [35:0.314] [37:N/A] Volume (cm) : [24:0.024] [35:0.063] [37:N/A] % Reduction in Area: [24:28.50%] [35:20.10%] [37:N/A] % Reduction in Volume: 89.60% [35:-61.50%]  [37:N/A] Classification: [24:Full Thickness Without Exposed Support Structures Exposed Support Structures] [35:Full Thickness Without] [37:N/A] Exudate Amount: [24:Small] [35:Small] [37:N/A] Exudate Type: [24:Serosanguineous] [35:Serosanguineous] [37:N/A] Exudate Color: [24:red, brown] [35:red, brown] [37:N/A] Wound Margin: [24:Flat and Intact] [35:Flat and Intact] [37:N/A] Granulation Amount: [24:Medium (34-66%)] [35:Large (67-100%)] [37:N/A] Granulation Quality: [24:Red] [35:Pink] [37:N/A] Necrotic Amount: [24:Medium (34-66%)] [35:None Present (0%)] [37:N/A] Exposed Structures: [24:Fat Layer (Subcutaneous Fat Layer (Subcutaneous N/A Tissue) Exposed: Yes Fascia: No Tendon: No Muscle: No Joint: No Bone: No] [35:Tissue) Exposed: Yes Fascia: No Tendon: No Muscle: No Joint: No Bone: No] Epithelialization: [24:Medium (34-66%)] [35:Small (1-33%) N/A] [37:N/A N/A] Treatment Notes Wound #24 (Left, Dorsal Foot) 1. Cleanse With Wound Cleanser Soap and water 2. Periwound Care Antifungal cream Barrier cream Moisturizing lotion TCA Cream 3. Primary Dressing Applied Iodoflex 4. Secondary Dressing Dry Gauze 6. Support Layer Applied 4 layer compression wrap Notes iodoflex to dorsal foot and silver alginate to other sites Wound #37 (Left, Lateral Malleolus) 1. Cleanse With Wound Cleanser Soap and water 2. Periwound Care Antifungal cream Barrier cream Moisturizing lotion TCA Cream 3. Primary Dressing Applied Calcium Alginate Ag 4. Secondary Dressing ABD Pad 6. Support Layer Applied 4 layer compression wrap Wound #38 (Right Toe - Web between 1st and 2nd) 1. Cleanse With Wound Cleanser Soap and water 2. Periwound Care Antifungal cream Moisturizing lotion TCA Cream 3. Primary Dressing Applied Calcium Alginate Ag 4. Secondary Dressing Dry Gauze Roll Gauze 5. Secured With Medipore tape Notes juxtalite Wound #40 (Left, Lateral Lower  Leg) 1. Cleanse With Wound Cleanser Soap  and water 2. Periwound Care Antifungal cream Barrier cream Moisturizing lotion TCA Cream 3. Primary Dressing Applied Calcium Alginate Ag 4. Secondary Dressing ABD Pad 6. Support Layer Applied 4 layer compression Water quality scientist) Signed: 03/03/2020 6:12:10 PM By: Levan Hurst RN, BSN Signed: 03/04/2020 5:51:00 PM By: Linton Ham MD Entered By: Linton Ham on 03/03/2020 08:52:57 -------------------------------------------------------------------------------- Multi-Disciplinary Care Plan Details Patient Name: Date of Service: CRIBBSartaj, Sitter 03/03/2020 7:30 AM Medical Record [id number] Patient Account Number: [id number] Date of Birth/Sex: Treating RN: 1986-07-05 (34 y.o. [name] Primary Care Clema Skousen: [name], [name] Other Clinician: Referring Jolynn Bajorek: Treating Anevay Campanella/Extender:Robson, [name], [name]: 217 Active Inactive Wound/Skin Impairment Nursing Diagnoses: Impaired tissue integrity Knowledge deficit related to ulceration/compromised skin integrity Goals: Patient/caregiver will verbalize understanding of skin care regimen Date Initiated: 01/05/2016 Target Resolution Date: 03/14/2020 Goal Status: Active Ulcer/skin breakdown will have a volume reduction of 30% by week 4 Date Initiated: 01/05/2016 Date Inactivated: 12/22/2017 Target Resolution Date: 01/19/2018 Unmet Reason: complex Goal Status: Unmet wounds, infection Interventions: Assess patient/caregiver ability to obtain necessary supplies Assess ulceration(s) every visit Provide education on ulcer and skin care Notes: Electronic Signature(s) Signed: 03/03/2020 6:12:10 PM By: Levan Hurst RN, BSN Entered By: Levan Hurst on 03/03/2020 07:57:00 -------------------------------------------------------------------------------- Pain Assessment Details Patient Name: Date of Service: Slee, [name] 03/03/2020 7:30 AM Medical Record [id number] Patient  Account Number: [id number] Date of Birth/Sex: Treating RN: [birth year] (34 y.o. [name] Primary Care Kaho Selle: [place], [place] Other Clinician: Referring Larsen Dungan: Treating Nakea [name]/Extender:Robson, [name], [name]: 217 Active Problems Location of Pain Severity and Description of Pain Patient Has Paino No Site Locations Pain Management and Medication Current Pain Management: Electronic Signature(s) Signed: 03/03/2020 6:12:10 PM By: Levan Hurst RN, BSN Signed: 03/05/2020 9:19:04 AM By: Sandre Kitty Entered By: Sandre Kitty on 03/03/2020 07:47:57 -------------------------------------------------------------------------------- Patient/Caregiver Education Details Patient Name: Date of Service: [name] 4/12/2021andnbsp7:30 AM Medical Record [id number] Patient Account Number: [id number] Date of Birth/Gender: Oct 12, 1986 (33 y.o. M) Treating RN: Levan Hurst Primary Care Physician: Janine Limbo Other Clinician: Referring Physician: Treating Physician/Extender:Robson, Pecola Leisure in Treatment: 217 Education Assessment Education Provided To: Patient Education Topics Provided Wound/Skin Impairment: Methods: Explain/Verbal Responses: State content correctly Electronic Signature(s) Signed: 03/03/2020 6:12:10 PM By: Levan Hurst RN, BSN Entered By: Levan Hurst on 03/03/2020 07:59:32 -------------------------------------------------------------------------------- Wound Assessment Details Patient Name: Date of Service: [name], [name] 03/03/2020 7:30 AM Medical Record [id number] Patient Account Number: [id number] Date of Birth/Sex: Treating RN: 10-Apr-1986 (34 y.o. [name] Primary Care Jennel Mara: [name], [name] Other Clinician: Referring Raelynn Corron: Treating Taylr Meuth/Extender:Robson, [name], [name]: 217 Wound Status Wound Number: 24 Primary Etiology: Inflammatory Wound  Location: Left, Dorsal Foot Wound Status: Open Wounding Event: Gradually Appeared Comorbid Sleep Apnea, Hypertension, History: Paraplegia Date Acquired: 03/08/2018 Weeks Of Treatment: 103 Clustered Wound: Yes Wound Measurements Length: (cm) 2.1 % Reduct Width: (cm) 2 % Reduct Depth: (cm) 0.3 Epitheli Clustered Quantity: 1 Tunnelin Area: (cm) 3.299 Undermi Volume: (cm) 0.99 Wound Description Classification: Full Thickness Without Exposed Support Foul Od Structures Slough/ Wound Flat and Intact Margin: Exudate Medium Amount: Exudate Serosanguineous Type: Exudate red, brown Color: Wound Bed Granulation Amount: Large (67-100%) Granulation Quality: Red Fasc Necrotic Amount: None Present (0%) Fat Tend Musc Join Bone or After Cleansing: No Fibrino Yes Exposed Structure ia Exposed: No Layer (Subcutaneous Tissue) Exposed: Yes on Exposed: No le Exposed: No t Exposed: No Exposed: No ion in Area: -1297.9%  ion in Volume: -4025% alization: Small (1-33%) g: No ning: No Treatment Notes Wound #24 (Left, Dorsal Foot) 1. Cleanse With Wound Cleanser Soap and water 2. Periwound Care Antifungal cream Barrier cream Moisturizing lotion TCA Cream 3. Primary Dressing Applied Iodoflex 4. Secondary Dressing Dry Gauze 6. Support Layer Applied 4 layer compression wrap Notes iodoflex to dorsal foot and silver alginate to other sites Electronic Signature(s) Signed: 03/03/2020 5:39:59 PM By: Baruch Gouty RN, BSN Entered By: Baruch Gouty on 03/03/2020 07:56:24 -------------------------------------------------------------------------------- Wound Assessment Details Patient Name: Date of Service: [name], [name] 03/03/2020 7:30 AM Medical Record [id number] Patient Account Number: [id number] Date of Birth/Sex: Treating RN: [birth year] (34 y.o. [name] Primary Care Kirk Sampley: [place], [place] Other Clinician: Referring Abdulloh [name]: Treating  Joliet Mallozzi/Extender:Robson, [name], [name]: 217 Wound Status Wound Number: 35 Primary Etiology: Pressure Ulcer Wound Location: Left Ischium Wound Status: Open Wounding Event: Gradually Appeared Comorbid Sleep Apnea, Hypertension, History: Paraplegia Date Acquired: 04/28/2019 Weeks Of Treatment: 44 Clustered Wound: Yes Wound Measurements Length: (cm) 0 % Reductio Width: (cm) 0 % Reductio Depth: (cm) 0 Epithelial Clustered Quantity: 2 Tunneling: Area: (cm) 0 Undermini Volume: (cm) 0 Wound Description Classification: Category/Stage III Wound Margin: Flat and Intact Exudate Amount: None Present Wound Bed Granulation Amount: None Present (0%) Necrotic Amount: None Present (0%) Foul Odor After Cleansing: No Slough/Fibrino No Exposed Structure Fascia Exposed: No Fat Layer (Subcutaneous Tissue) Exposed: No Tendon Exposed: No Muscle Exposed: No Joint Exposed: No Bone Exposed: No n in Area: 100% n in Volume: 100% ization: Large (67-100%) No ng: No Electronic Signature(s) Signed: 03/03/2020 5:39:59 PM By: Baruch Gouty RN, BSN Entered By: Baruch Gouty on 03/03/2020 08:03:55 -------------------------------------------------------------------------------- Wound Assessment Details Patient Name: Date of Service: [name], [name] 03/03/2020 7:30 AM Medical Record [id number] Patient Account Number: [id number] Date of Birth/Sex: Treating RN: 12-Jan-1986 (34 y.o. [name] Primary Care Marialena Wollen: [place], [place] Other Clinician: Referring Pace Lamadrid: Treating Abbygayle Helfand/Extender:Robson, [name], [name]: 217 Wound Status Wound Number: 37 Primary Etiology: Venous Leg Ulcer Wound Location: Left, Lateral Malleolus Wound Status: Open Wounding Event: Gradually Appeared Comorbid Sleep Apnea, Hypertension, History: Paraplegia Date Acquired: 11/26/2019 Weeks Of Treatment: 14 Clustered Wound: No Wound Measurements Length:  (cm) 3.5 % Reductio Width: (cm) 1.5 % Reductio Depth: (cm) 0.3 Epithelial Area: (cm) 4.123 Tunneling Volume: (cm) 1.237 Undermini Wound Description Classification: Full Thickness Without Exposed Support Foul Od Structures Slough/ Wound Flat and Intact Margin: Exudate Medium Amount: Exudate Purulent Type: Exudate yellow, brown, green Color: Wound Bed Granulation Amount: Large (67-100%) Granulation Quality: Red Fascia Necrotic Amount: Small (1-33%) Fat Lay Necrotic Quality: Adherent Slough Tendon Muscle Joint E Bone Ex or After Cleansing: No Fibrino Yes Exposed Structure Exposed: No er (Subcutaneous Tissue) Exposed: Yes Exposed: No Exposed: No xposed: No posed: No n in Area: -4.2% n in Volume: -212.4% ization: Small (1-33%) : No ng: No Treatment Notes Wound #37 (Left, Lateral Malleolus) 1. Cleanse With Wound Cleanser Soap and water 2. Periwound Care Antifungal cream Barrier cream Moisturizing lotion TCA Cream 3. Primary Dressing Applied Calcium Alginate Ag 4. Secondary Dressing ABD Pad 6. Support Layer Applied 4 layer compression Water quality scientist) Signed: 03/03/2020 5:39:59 PM By: Baruch Gouty RN, BSN Entered By: Baruch Gouty on 03/03/2020 07:58:59 -------------------------------------------------------------------------------- Wound Assessment Details Patient Name: Date of Service: [name], [name] 03/03/2020 7:30 AM Medical Record [id number] Patient Account Number: [id number] Date of Birth/Sex: Treating RN: Jun 17, 1986 (34 y.o. [name] Primary Care Ebon Ketchum: [name], [name] Other Clinician: Referring Dyneisha Murchison: Treating  Derion Kreiter/Extender:Robson, [name], [name]: 217 Wound Status Wound Number: 38 Primary Etiology: Inflammatory Wound Location: Right Toe - Web between 1st and Wound Status: Open 2nd Comorbid Sleep Apnea, Hypertension, Wounding Event: Gradually Appeared History:  Paraplegia Date Acquired: 11/30/2019 Weeks Of Treatment: 13 Clustered Wound: No Wound Measurements Length: (cm) 0.6 % Reduct Width: (cm) 0.5 % Reduct Depth: (cm) 0.1 Epitheli Area: (cm) 0.236 Tunneli Volume: (cm) 0.024 Undermi Wound Description Classification: Full Thickness Without Exposed Support Foul Odo Structures Slough/F Wound Flat and Intact Margin: Exudate Small Amount: Exudate Serosanguineous Type: Exudate red, brown Color: Wound Bed Granulation Amount: Medium (34-66%) Granulation Quality: Red Fascia E Necrotic Amount: Medium (34-66%) Fat Laye Necrotic Quality: Adherent Slough Tendon E Muscle E Joint Ex Bone Exp r After Cleansing: No ibrino Yes Exposed Structure xposed: No r (Subcutaneous Tissue) Exposed: Yes xposed: No xposed: No posed: No osed: No ion in Area: 28.5% ion in Volume: 89.6% alization: Medium (34-66%) ng: No ning: No Treatment Notes Wound #38 (Right Toe - Web between 1st and 2nd) 1. Cleanse With Wound Cleanser Soap and water 2. Periwound Care Antifungal cream Moisturizing lotion TCA Cream 3. Primary Dressing Applied Calcium Alginate Ag 4. Secondary Dressing Dry Gauze Roll Gauze 5. Secured With Mineral tape Notes Development worker, international aid) Signed: 03/03/2020 5:39:59 PM By: Baruch Gouty RN, BSN Entered By: Baruch Gouty on 03/03/2020 08:01:20 -------------------------------------------------------------------------------- Wound Assessment Details Patient Name: Date of Service: [name], [name] 03/03/2020 7:30 AM Medical Record [id number] Patient Account Number: [id number] Date of Birth/Sex: Treating RN: 10-01-1986 (34 y.o. [name] Primary Care Elizabeth Haff: [place], [place] Other Clinician: Referring Luria Rosario: Treating Kenyada Dosch/Extender:Robson, [name], [name]: 217 Wound Status Wound Number: 40 Primary Etiology: Venous Leg Ulcer Wound Location: Left, Lateral Lower Leg  Secondary Lymphedema Etiology: Wounding Event: Gradually Appeared Wound Status: Open Date Acquired: 02/11/2020 Comorbid History: Sleep Apnea, Hypertension, Weeks Of Treatment: 3 Paraplegia Clustered Wound: No Wound Measurements Length: (cm) 1 % Reduct Width: (cm) 0.4 % Reduct Depth: (cm) 0.2 Epitheli Area: (cm) 0.314 Tunneli Volume: (cm) 0.063 Undermi Wound Description Classification: Full Thickness Without Exposed Support Foul Odo Structures Slough/F Wound Flat and Intact Margin: Exudate Small Amount: Exudate Serosanguineous Type: Exudate red, brown Color: Wound Bed Granulation Amount: Large (67-100%) Granulation Quality: Pink Fascia E Necrotic Amount: None Present (0%) Fat Laye Tendon E Muscle E Joint Ex Bone Exp r After Cleansing: No ibrino Yes Exposed Structure xposed: No r (Subcutaneous Tissue) Exposed: Yes xposed: No xposed: No posed: No osed: No ion in Area: 20.1% ion in Volume: -61.5% alization: Small (1-33%) ng: No ning: No Treatment Notes Wound #40 (Left, Lateral Lower Leg) 1. Cleanse With Wound Cleanser Soap and water 2. Periwound Care Antifungal cream Barrier cream Moisturizing lotion TCA Cream 3. Primary Dressing Applied Calcium Alginate Ag 4. Secondary Dressing ABD Pad 6. Support Layer Applied 4 layer compression Water quality scientist) Signed: 03/03/2020 5:39:59 PM By: Baruch Gouty RN, BSN Entered By: Baruch Gouty on 03/03/2020 07:57:51 -------------------------------------------------------------------------------- [place] Details Patient Name: Date of Service: [name], [name] 03/03/2020 7:30 AM Medical Record [id number] Patient Account Number: [id number] Date of Birth/Sex: Treating RN: 1986-07-05 (34 y.o. [name] Primary Care Tyaira Heward: [place], [place] Other Clinician: Referring Rogue Pautler: Treating Ojani Berenson/Extender:Robson, [name], [name]: 217 Vital Signs Time Taken:  07:47 Temperature (F): 98.2 Height (in): 70 Pulse (bpm): 92 Weight (lbs): 216 Respiratory Rate (breaths/min): 20 Body Mass Index (BMI): 31 Blood Pressure (mmHg): 115/60 Reference Range: 80 - 120 mg / dl Electronic  Signature(s) Signed: 03/05/2020 9:19:04 AM By: Sandre Kitty Entered By: Sandre Kitty on 03/03/2020 07:47:29

## 2020-03-05 NOTE — Progress Notes (Signed)
[place], DOT HENDEL ([id number]) Visit Report for 02/04/2020 Arrival Information Details Patient Name: Date of Service: [name], [name] 02/04/2020 7:30 AM Medical Record [id number] Patient Account Number: [id number] Date of Birth/Sex: Treating RN: 1985-12-30 (34 y.o. [name] Primary Care Kyston Gonce: [name], [name] Other Clinician: Referring Trine Fread: Treating Berthe Oley/Extender:Robson, [name], [name]: 213 Visit Information History Since Last Visit All ordered tests and consults were completed: Yes Patient Arrived: Wheel Chair Added or deleted any medications: No Arrival Time: 07:40 Any new allergies or adverse reactions: No Accompanied By: self Had a fall or experienced change in No activities of daily living that may affect Transfer Assistance: None risk of falls: Patient Identification Verified: Yes Signs or symptoms of abuse/neglect since last No Secondary Verification Process Completed: Yes visito Patient Requires Transmission-Based No Hospitalized since last visit: No Precautions: Implantable device outside of the clinic excluding No Patient Has Alerts: Yes cellular tissue based products placed in the center Patient Alerts: R ABI = since last visit: 1.0 Has Dressing in Place as Prescribed: Yes L ABI = Has Compression in Place as Prescribed: Yes 1.1 Pain Present Now: No Electronic Signature(s) Signed: 02/04/2020 5:25:09 PM By: Deon Pilling Entered By: Deon Pilling on 02/04/2020 07:43:50 -------------------------------------------------------------------------------- Compression Therapy Details Patient Name: Date of Service: [name], [name] 02/04/2020 7:30 AM Medical Record [id number] Patient Account Number: [id number] Date of Birth/Sex: Treating RN: 02-04-1986 (34 y.o. [name] Primary Care Lacey Wallman: Savona, Lometa Other Clinician: Referring Jamey Demchak: Treating Terel Bann/Extender:Robson, [name], [name] Weeks in  Treatment: 213 Compression Therapy Performed for Wound Wound #37 Left,Lateral Malleolus Assessment: Performed By: Clinician Levan Hurst, RN Compression Type: Four Layer Post Procedure Diagnosis Same as Pre-procedure Electronic Signature(s) Signed: 02/04/2020 5:44:18 PM By: Levan Hurst RN, BSN Entered By: Levan Hurst on 02/04/2020 08:08:47 -------------------------------------------------------------------------------- Encounter Discharge Information Details Patient Name: Date of Service: [name], [name] 02/04/2020 7:30 AM Medical Record [id number] Patient Account Number: [id number] Date of Birth/Sex: Treating RN: [birth year] (34 y.o. [name] Primary Care Shakiara Lukic: [name], [name] Other Clinician: Referring Mandi Mattioli: Treating Ademide Schaberg/Extender:Robson, [name], [name]: 213 Encounter Discharge Information Items Post Procedure Vitals Discharge Condition: Stable Temperature (F): 97.6 Ambulatory Status: Wheelchair Pulse (bpm): 113 Discharge Destination: Home Respiratory Rate (breaths/min): 20 Transportation: Private Auto Blood Pressure (mmHg): 123/74 Accompanied By: self Schedule Follow-up Appointment: Yes Clinical Summary of Care: Electronic Signature(s) Signed: 02/04/2020 5:25:09 PM By: Deon Pilling Entered By: Deon Pilling on 02/04/2020 08:30:40 -------------------------------------------------------------------------------- Lower Extremity Assessment Details Patient Name: Date of Service: [name], [name] 02/04/2020 7:30 AM Medical Record [id number] Patient Account Number: [id number] Date of Birth/Sex: Treating RN: 1986-05-23 (34 y.o. [name] Primary Care Cadence Haslam: [name], [name] Other Clinician: Referring Autrey Human: Treating Serah Nicoletti/Extender:Robson, [name], [name]: 213 Edema Assessment Assessed: [Left: Yes] [Right: Yes] Edema: [Left: Yes] [Right: Yes] Calf Left: Right: Point of  Measurement: 33 cm From Medial Instep 28 cm 33 cm Ankle Left: Right: Point of Measurement: 10 cm From Medial Instep 22 cm 23 cm Electronic Signature(s) Signed: 02/04/2020 5:25:09 PM By: Deon Pilling Entered By: Deon Pilling on 02/04/2020 07:49:22 -------------------------------------------------------------------------------- Multi Wound Chart Details Patient Name: Date of Service: [name], [name] 02/04/2020 7:30 AM Medical Record [id number] Patient Account Number: [id number] Date of Birth/Sex: Treating RN: 10-Nov-1986 (34 y.o. [name] Primary Care Donta Fuster: [name], [name] Other Clinician: Referring Latrelle Fuston: Treating Destyni Hoppel/Extender:Robson, [name], [name]: 213 Vital Signs Height(in): 70 Pulse(bpm): 113 Weight(lbs): 216 Blood Pressure(mmHg): 123/74 Body Mass Index(BMI): 31 Temperature(F): 97.6 Respiratory  20 Rate(breaths/min): Photos: [24:No Photos] [35:No Photos] [37:No Photos] Wound Location: [24:Left Foot - Dorsal] [35:Left Ischium] [37:Left Malleolus - Lateral] Wounding Event: [24:Gradually Appeared] [35:Gradually Appeared] [37:Gradually Appeared] Primary Etiology: [24:Inflammatory] [35:Pressure Ulcer] [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] Comorbid History: [24:Sleep Apnea, Hypertension, Sleep Apnea, Hypertension, Sleep Apnea, Hypertension, Paraplegia] [35:Paraplegia] [37:Paraplegia] Date Acquired: [24:03/08/2018] [35:04/28/2019] [37:11/26/2019] Weeks of Treatment: [24:99] [35:40] [37:10] Wound Status: [24:Open] [35:Open] [37:Open] Clustered Wound: [24:Yes] [35:Yes] [37:No] Clustered Quantity: [24:1] [35:2] [37:N/A] Measurements L x W x D 2.9x2.5x0.7 [35:2.2x1.2x0.1] [37:3.3x1.8x0.1] (cm) Area (cm) : [24:5.694] [35:2.073] [37:4.665] Volume (cm) : [24:3.986] [35:0.207] [37:0.467] % Reduction in Area: [24:-2312.70%] [35:85.30%] [37:-17.90%] % Reduction in Volume: -16508.30% [35:85.40%] [37:-17.90%] Classification: [24:Full Thickness Without  Exposed Support Structures] [35:Category/Stage III] [37:Full Thickness Without Exposed Support Structures] Exudate Amount: [24:Medium] [35:Medium] [37:Medium] Exudate Type: [24:Serosanguineous] [35:Serosanguineous] [37:Serosanguineous] Exudate Color: [24:red, brown] [35:red, brown] [37:red, brown] Wound Margin: [24:Distinct, outline attached] [35:Flat and Intact] [37:Flat and Intact] Granulation Amount: [24:Small (1-33%)] [35:Large (67-100%)] [37:Small (1-33%)] Granulation Quality: [24:Red] [35:Red, Friable] [37:Pink] Necrotic Amount: [24:Large (67-100%)] [35:None Present (0%)] [37:Large (67-100%)] Exposed Structures: [24:Fat Layer (Subcutaneous Tissue) Exposed: Yes Fascia: No Tendon: No Muscle: No Joint: No Bone: No] [35:Fat Layer (Subcutaneous Tissue) Exposed: Yes Fascia: No Tendon: No Muscle: No Joint: No Bone: No] [37:Fat Layer (Subcutaneous Tissue) Exposed: Yes  Fascia: No Tendon: No Muscle: No Joint: No Bone: No] Epithelialization: [24:None] [35:Small (1-33%)] [37:Small (1-33%)] Debridement: [24:N/A] [35:N/A] [37:Debridement - Excisional] Pre-procedure [24:N/A] [35:N/A] [37:08:06] Verification/Time Out Taken: Tissue Debrided: [24:N/A] [35:N/A] [37:Subcutaneous] Level: [24:N/A] [35:N/A] [37:Skin/Subcutaneous Tissue] Debridement Area (sq cm):N/A [35:N/A] [37:5.94] Instrument: [24:N/A] [35:N/A] [37:Curette] Bleeding: [24:N/A] [35:N/A] [37:Minimum] Hemostasis Achieved: [24:N/A] [35:N/A] [37:Pressure] Procedural Pain: [24:N/A] [35:N/A] [37:0] Post Procedural Pain: [24:N/A] [35:N/A] [37:0] Debridement Treatment N/A [35:N/A] [37:Procedure was tolerated] Response: [37:well] Post Debridement [24:N/A] [35:N/A] [37:3.3x1.8x0.1] Measurements L x W x D (cm) Post Debridement [24:N/A] [35:N/A] [37:0.467] Volume: (cm) Procedures Performed: N/A [24:38] [35:N/A 39] [37:Compression Therapy Debridement] Photos: [24:No Photos] [35:No Photos] [37:N/A] Wound Location: [24:Right Toe - Web between  Left, Proximal, Dorsal Foot N/A 1st and 2nd] Wounding Event: [24:Gradually Appeared] [35:Gradually Appeared] [37:N/A] Primary Etiology: [24:Inflammatory] [35:Inflammatory] [37:N/A] Comorbid History: [24:Sleep Apnea, Hypertension, N/A Paraplegia] [37:N/A] Date Acquired: [24:11/30/2019] [35:01/24/2020] [37:N/A] Weeks of Treatment: [24:9] [35:1] [37:N/A] Wound Status: [24:Open] [35:Healed - Epithelialized] [37:N/A] Clustered Wound: [24:No] [35:No] [37:N/A] Clustered Quantity: [24:N/A] [35:N/A] [37:N/A] Measurements L x W x D 3x2x0.2 [35:0x0x0] [37:N/A] (cm) Area (cm) : [24:4.712] [35:0] [37:N/A] Volume (cm) : [24:0.942] [35:0] [37:N/A] % Reduction in Area: [24:-1327.90%] [35:100.00%] [37:N/A] % Reduction in Volume: -307.80% [35:100.00%] [37:N/A] Classification: [24:Full Thickness Without Exposed Support Structures Exposed Support Structures] [35:Full Thickness Without] [37:N/A] Exudate Amount: [24:Medium] [35:N/A] [37:N/A] Exudate Type: [24:Serosanguineous] [35:N/A] [37:N/A] Exudate Color: [24:red, brown] [35:N/A] [37:N/A] Wound Margin: [24:Flat and Intact] [35:N/A] [37:N/A] Granulation Amount: [24:Medium (34-66%)] [35:N/A] [37:N/A] Granulation Quality: [24:Red] [35:N/A] [37:N/A] Necrotic Amount: [24:Medium (34-66%)] [35:N/A] [37:N/A] Exposed Structures: [24:Fat Layer (Subcutaneous Tissue) Exposed: Yes Fascia: No Tendon: No Muscle: No Joint: No Bone: No] [35:N/A] [37:N/A] Epithelialization: [24:Small (1-33%)] [35:N/A] [37:N/A] Debridement: [24:N/A] [35:N/A] [37:N/A] Tissue Debrided: [24:N/A] [35:N/A] [37:N/A] Level: [24:N/A] [35:N/A] [37:N/A] Debridement Area (sq cm):N/A [35:N/A] [37:N/A] Instrument: [24:N/A] [35:N/A] [37:N/A] Bleeding: [24:N/A] [35:N/A] [37:N/A] Hemostasis Achieved: [24:N/A] [35:N/A] [37:N/A] Procedural Pain: [24:N/A] [35:N/A] [37:N/A] Post Procedural Pain: [24:N/A] [35:N/A] [37:N/A] Debridement Treatment N/A [35:N/A] [37:N/A] Response: Post Debridement [24:N/A]  [35:N/A] [37:N/A] Measurements L x W x D (cm) Post Debridement [24:N/A] [35:N/A] [37:N/A] Volume: (cm) Procedures Performed: N/A [35:N/A] [37:N/A] Treatment Notes Electronic Signature(s) Signed: 02/04/2020 5:44:18 PM By: Levan Hurst RN, BSN  Signed: 02/05/2020 10:32:20 AM By: Linton Ham MD Entered By: Linton Ham on 02/04/2020 08:11:49 -------------------------------------------------------------------------------- Multi-Disciplinary Care Plan Details Patient Name: Date of Service: [name] 02/04/2020 7:30 AM Medical Record [id number] Patient Account Number: [id number] Date of Birth/Sex: Treating RN: Aug 12, 1986 (34 y.o. [name] Primary Care Sheria Rosello: [name], [name] Other Clinician: Referring Lucilia Yanni: Treating Kiasia Chou/Extender:Robson, [name], [name]: 213 Active Inactive Wound/Skin Impairment Nursing Diagnoses: Impaired tissue integrity Knowledge deficit related to ulceration/compromised skin integrity Goals: Patient/caregiver will verbalize understanding of skin care regimen Date Initiated: 01/05/2016 Target Resolution Date: 02/15/2020 Goal Status: Active Ulcer/skin breakdown will have a volume reduction of 30% by week 4 Date Initiated: 01/05/2016 Date Inactivated: 12/22/2017 Target Resolution Date: 01/19/2018 Unmet Reason: complex Goal Status: Unmet wounds, infection Interventions: Assess patient/caregiver ability to obtain necessary supplies Assess ulceration(s) every visit Provide education on ulcer and skin care Notes: Electronic Signature(s) Signed: 02/04/2020 5:44:18 PM By: Levan Hurst RN, BSN Entered By: Levan Hurst on 02/04/2020 07:56:52 -------------------------------------------------------------------------------- Pain Assessment Details Patient Name: Date of Service: [name], [name] 02/04/2020 7:30 AM Medical Record [id number] Patient Account Number: [id number] Date of Birth/Sex: Treating  RN: 1986-06-04 (33 y.o. [name] Primary Care Petrita Blunck: [place], [place] Other Clinician: Referring Jabree Pernice: Treating Davione Lenker/Extender:Robson, [name], [name]: 213 Active Problems Location of Pain Severity and Description of Pain Patient Has Paino No Site Locations Rate the pain. Current Pain Level: 0 Pain Management and Medication Current Pain Management: Medication: No Cold Application: No Rest: No Massage: No Activity: No T.FerrellN.S.: No Heat Application: No Leg drop or elevation: No Is the Current Pain Management Adequate: Adequate How does your wound impact your activities of daily livingo Sleep: No Bathing: No Appetite: No Relationship With Others: No Bladder Continence: No Emotions: No Bowel Continence: No Work: No Toileting: No Drive: No Dressing: No Hobbies: No Electronic Signature(s) Signed: 02/04/2020 5:25:09 PM By: Deon Pilling Entered By: Deon Pilling on 02/04/2020 07:46:06 -------------------------------------------------------------------------------- Patient/Caregiver Education Details Patient Name: Date of Service: [name] 3/15/2021andnbsp7:30 AM Medical Record [id number] Patient Account Number: [id number] Date of Birth/Gender: Jun 17, 1986 (33 y.o. M) Treating RN: Levan Hurst Primary Care Physician: Janine Limbo Other Clinician: Referring Physician: Treating Physician/Extender:Robson, Pecola Leisure in Treatment: 213 Education Assessment Education Provided To: Patient Education Topics Provided Wound/Skin Impairment: Methods: Explain/Verbal Responses: State content correctly Electronic Signature(s) Signed: 02/04/2020 5:44:18 PM By: Levan Hurst RN, BSN Entered By: Levan Hurst on 02/04/2020 07:58:09 -------------------------------------------------------------------------------- Wound Assessment Details Patient Name: Date of Service: [name], [name] 02/04/2020 7:30 AM Medical  Record [id number] Patient Account Number: [id number] Date of Birth/Sex: Treating RN: [birth year] (34 y.o. [name] Primary Care Ollis Daudelin: Other Clinician: Janine Limbo Referring Neco Kling: Treating Joani Cosma/Extender:Robson, [name], [name]: 213 Wound Status Wound Number: 24 Primary Etiology: Inflammatory Wound Location: Left, Dorsal Foot Wound Status: Open Wounding Event: Gradually Appeared Comorbid Sleep Apnea, Hypertension, History: Paraplegia Date Acquired: 03/08/2018 Weeks Of Treatment: 99 Clustered Wound: Yes Photos Wound Measurements Length: (cm) 2.9 % Reduct Width: (cm) 2.5 % Reduct Depth: (cm) 0.7 Epitheli Clustered Quantity: 1 Tunnelin Area: (cm) 5.694 Undermi Volume: (cm) 3.986 Wound Description Full Thickness Without Exposed Support Foul Odo Classification: Structures Slough/F Wound Distinct, outline attached Margin: Exudate Medium Amount: Exudate Serosanguineous Type: Exudate red, brown Color: Wound Bed Granulation Amount: Small (1-33%) Granulation Quality: Red Fascia E Necrotic Amount: Large (67-100%) Fat Laye Necrotic Quality: Adherent Slough Tendon E Muscle E Joint Ex Bone Exp r After Cleansing: No ibrino Yes Exposed Structure xposed:  No r (Subcutaneous Tissue) Exposed: Yes xposed: No xposed: No posed: No osed: No ion in Area: -2312.7% ion in Volume: -16508.3% alization: None g: No ning: No Electronic Signature(s) Signed: 02/12/2020 3:59:58 PM By: Mikeal Hawthorne EMT/HBOT Signed: 03/05/2020 9:03:51 AM By: Levan Hurst RN, BSN Previous Signature: 02/05/2020 4:45:30 PM Version By: Mikeal Hawthorne EMT/HBOT Previous Signature: 02/04/2020 5:25:09 PM Version By: Deon Pilling Entered By: Mikeal Hawthorne on 02/12/2020 11:21:58 -------------------------------------------------------------------------------- Wound Assessment Details Patient Name: Date of Service: [name], [name] 02/04/2020 7:30 AM Medical  Record [id number] Patient Account Number: [id number] Date of Birth/Sex: Treating RN: 1986-02-06 (34 y.o. [name] Primary Care Melanie Pellot: [place], [place] Other Clinician: Referring Arthor Gorter: Treating Lelah Rennaker/Extender:Robson, [name], [name]: 213 Wound Status Wound Number: 35 Primary Etiology: Pressure Ulcer Wound Location: Left Ischium Wound Status: Open Wounding Event: Gradually Appeared Comorbid Sleep Apnea, Hypertension, History: Paraplegia Date Acquired: 04/28/2019 Weeks Of Treatment: 40 Clustered Wound: Yes Photos Wound Measurements Length: (cm) 2.2 % Reduction Width: (cm) 1.2 % Reduction Depth: (cm) 0.1 Epithelializ Clustered Quantity: 2 Tunneling: Area: (cm) 2.073 Undermining Volume: (cm) 0.207 Wound Description Classification: Category/Stage III Foul Odor A Wound Margin: Flat and Intact Slough/Fibr Exudate Amount: Medium Exudate Type: Serosanguineous Exudate Color: red, brown Wound Bed Granulation Amount: Large (67-100%) Granulation Quality: Red, Friable Fascia Expos Necrotic Amount: None Present (0%) Fat Layer (S Tendon Expos Muscle Expos Joint Expose Bone Exposed Electronic Signature(s) Signed: 02/05/2020 4:45:30 PM By: Mikeal Hawthorne EMT/HBOT Signed: 03/05/2020 9:03:51 AM By: Levan Hurst RN, BSN Previous Signature: 02/04/2020 5:25:09 PM Version By: Lady Deutscher Entered By: Mikeal Hawthorne on 03/1 fter Cleansing: No ino No Exposed Structure ed: No ubcutaneous Tissue) Exposed: Yes ed: No ed: No d: No : No i 04/2020 10:08:29 in Area: 85.3% in Volume: 85.4% ation: Small (1-33%) No : No -------------------------------------------------------------------------------- Wound Assessment Details Patient Name: Date of Service: [name], [name] 02/04/2020 7:30 AM Medical Record [id number] Patient Account Number: [id number] Date of Birth/Sex: Treating RN: 12/03/1985 (34 y.o. [name] Primary Care  Annett Boxwell: [place], [place] Other Clinician: Referring Aniken Monestime: Treating Torianne Laflam/Extender:Robson, [name], [name]: 213 Wound Status Wound Number: 37 Primary Etiology: Venous Leg Ulcer Wound Location: Left Malleolus - Lateral Wound Status: Open Wounding Event: Gradually Appeared Comorbid Sleep Apnea, Hypertension, History: Paraplegia Date Acquired: 11/26/2019 Weeks Of Treatment: 10 Clustered Wound: No Photos Wound Measurements Length: (cm) 3.3 % Reduct Width: (cm) 1.8 % Reduct Depth: (cm) 0.1 Epitheli Area: (cm) 4.665 Tunneli Volume: (cm) 0.467 Undermi Wound Description Classification: Full Thickness Without Exposed Support Foul Odo Structures Slough/F Wound Flat and Intact Margin: Exudate Medium Amount: Exudate Serosanguineous Type: Exudate red, brown red, brown Color: Wound Bed Granulation Amount: Small (1-33%) Granulation Quality: Pink Fascia Ex Necrotic Amount: Large (67-100%) Fat Layer Necrotic Quality: Adherent Slough Tendon Ex Muscle Ex Joint Exp Bone Expo r After Cleansing: No ibrino Yes Exposed Structure posed: No (Subcutaneous Tissue) Exposed: Yes posed: No posed: No osed: No sed: No ion in Area: -17.9% ion in Volume: -17.9% alization: Small (1-33%) ng: No ning: No Electronic Signature(s) Signed: 02/05/2020 4:45:30 PM By: Mikeal Hawthorne EMT/HBOT Signed: 03/05/2020 9:03:51 AM By: Levan Hurst RN, BSN Previous Signature: 02/04/2020 5:25:09 PM Version By: Deon Pilling Entered By: Mikeal Hawthorne on 02/05/2020 10:09:09 -------------------------------------------------------------------------------- Wound Assessment Details Patient Name: Date of Service: [name], [name] 02/04/2020 7:30 AM Medical Record [id number] Patient Account Number: [id number] Date of Birth/Sex: Treating RN: [birth year] (34 y.o. [name] Primary Care Donavon Kimrey: [name], [name] Other Clinician: Referring Prophet Renwick: Treating  Jaquann Guarisco/Extender:Robson, [name], [name]: 213 Wound Status Wound Number: 38 Primary Etiology: Inflammatory Wound Location: Right Toe - Web between 1st and Wound Status: Open 2nd Comorbid Sleep Apnea, Hypertension, Wounding Event: Gradually Appeared History: Paraplegia Date Acquired: 11/30/2019 Weeks Of Treatment: 9 Clustered Wound: No Photos Wound Measurements Length: (cm) 3 % Reducti Width: (cm) 2 % Reducti Depth: (cm) 0.2 Epithelia Area: (cm) 4.712 Tunnelin Volume: (cm) 0.942 Undermin Wound Description Classification: Full Thickness Without Exposed Support Foul Odo Structures Slough/F Wound Flat and Intact Margin: Exudate Medium Amount: Exudate Serosanguineous Type: Exudate red, brown Color: Wound Bed Granulation Amount: Medium (34-66%) Granulation Quality: Red Fascia E Necrotic Amount: Medium (34-66%) Fat Laye Necrotic Quality: Adherent Slough Tendon E Muscle E Joint Ex Bone Exp r After Cleansing: No ibrino Yes Exposed Structure xposed: No r (Subcutaneous Tissue) Exposed: Yes xposed: No xposed: No posed: No osed: No on in Area: -1327.9% on in Volume: -307.8% lization: Small (1-33%) g: No ing: No Electronic Signature(s) Signed: 02/05/2020 4:45:30 PM By: Mikeal Hawthorne EMT/HBOT Signed: 02/06/2020 4:31:34 PM By: Deon Pilling Previous Signature: 02/04/2020 5:25:09 PM Version By: Deon Pilling Entered By: Mikeal Hawthorne on 02/05/2020 10:38:07 -------------------------------------------------------------------------------- Wound Assessment Details Patient Name: Date of Service: [name], [name] 02/04/2020 7:30 AM Medical Record [id number] Patient Account Number: [id number] Date of Birth/Sex: Treating RN: [birth year] (34 y.o. [name] Primary Care Nary Sneed: [name], [name] Other Clinician: Referring Melinna Linarez: Treating Lyberti Thrush/Extender:Robson, [name], [name]: 213 Wound Status Wound Number:  39 Primary Etiology: Inflammatory Wound Location: Left, Proximal, Dorsal Foot Wound Status: Healed - Epithelialized Wounding Event: Gradually Appeared Date Acquired: 01/24/2020 Weeks Of Treatment: 1 Clustered Wound: No Wound Measurements Length: (cm) 0 Width: (cm) 0 Depth: (cm) 0 Area: (cm) 0 Volume: (cm) 0 Wound Description Classification: Full Thickness Without Exposed Suppor Structures % Reduction in Area: 100% % Reduction in Volume: 100% t Electronic Signature(s) Signed: 02/04/2020 5:25:09 PM By: Deon Pilling Entered By: Deon Pilling on 02/04/2020 07:58:39 -------------------------------------------------------------------------------- Vitals Details Patient Name: Date of Service: States, [name] 02/04/2020 7:30 AM Medical Record [id number] Patient Account Number: [id number] Date of Birth/Sex: Treating RN: Apr 06, 1986 (34 y.o. [name] Primary Care Izzabell Klasen: [name], [name] Other Clinician: Referring Brianna Bennett: Treating Shermon Bozzi/Extender:Robson, [name], [name]: 213 Vital Signs Time Taken: 07:43 Temperature (F): 97.6 Height (in): 70 Pulse (bpm): 113 Weight (lbs): 216 Respiratory Rate (breaths/min): 20 Body Mass Index (BMI): 31 Blood Pressure (mmHg): 123/74 Reference Range: 80 - 120 mg / dl Electronic Signature(s) Signed: 02/04/2020 5:25:09 PM By: Deon Pilling Entered By: Deon Pilling on 02/04/2020 07:45:58

## 2020-03-05 NOTE — Progress Notes (Signed)
[name] ([id number]) Visit Report for 12/31/2019 Arrival Information Details Patient Name: Date of Service: [name], [name] 12/31/2019 7:30 AM Medical Record [id number] Patient Account Number: [id number] Date of Birth/Sex: Treating RN: 11-30-1985 (34 y.o. M) Primary Care Lovinia Snare: [name], [name] Other Clinician: Referring Milee Qualls: Treating Juleon Narang/Extender:Robson, [name], [name]: 208 Visit Information History Since Last Visit Added or deleted any medications: No Patient Arrived: Wheel Chair Any new allergies or adverse reactions: No Arrival Time: 07:58 Had a fall or experienced change in No activities of daily living that may affect Accompanied By: self risk of falls: Transfer Assistance: None Signs or symptoms of abuse/neglect since last No Patient Identification Verified: Yes visito Secondary Verification Process Completed: Yes Hospitalized since last visit: No Patient Requires Transmission-Based No Implantable device outside of the clinic excluding No Precautions: cellular tissue based products placed in the center Patient Has Alerts: Yes since last visit: Patient Alerts: R ABI = Has Dressing in Place as Prescribed: Yes 1.0 Pain Present Now: No L ABI = 1.1 Electronic Signature(s) Signed: 03/05/2020 9:23:26 AM By: Sandre Kitty Entered By: Sandre Kitty on 12/31/2019 07:58:58 -------------------------------------------------------------------------------- Compression Therapy Details Patient Name: Date of Service: [name], [name] 12/31/2019 7:30 AM Medical Record [id number] Patient Account Number: [id number] Date of Birth/Sex: Treating RN: 1986/06/19 (34 y.o. [name] Primary Care Frederik Standley: Combs, Bernice Other Clinician: Referring Audrena Talaga: Treating Maysun Meditz/Extender:Robson, [name], [name]: 208 Compression Therapy Performed for Wound Wound #37 Left,Lateral  Malleolus Assessment: Performed By: Clinician Levan Hurst, RN Compression Type: Three Layer Post Procedure Diagnosis Same as Pre-procedure Electronic Signature(s) Signed: 01/01/2020 5:30:42 PM By: Levan Hurst RN, BSN Entered By: Levan Hurst on 12/31/2019 08:45:23 -------------------------------------------------------------------------------- Encounter Discharge Information Details Patient Name: Date of Service: [name], [name] [name] 12/31/2019 7:30 AM Medical Record [id number] Patient Account Number: [id number] Date of Birth/Sex: Treating RN: 04-Mar-1986 (34 y.o. [name] Primary Care Jeanni Allshouse: [place], [place] Other Clinician: Referring Celie Desrochers: Treating Kinley Dozier/Extender:Robson, [name], [name]: 208 Encounter Discharge Information Items Post Procedure Vitals Discharge Condition: Stable Temperature (F): 98.1 Ambulatory Status: Wheelchair Pulse (bpm): 101 Discharge Destination: Home Respiratory Rate (breaths/min): 20 Transportation: Other Blood Pressure (mmHg): 119/72 Accompanied By: self Schedule Follow-up Appointment: Yes Clinical Summary of Care: Patient Declined Electronic Signature(s) Signed: 12/31/2019 5:38:07 PM By: Kela Millin Entered By: Kela Millin on 12/31/2019 08:56:56 -------------------------------------------------------------------------------- Lower Extremity Assessment Details Patient Name: Date of Service: [name] 12/31/2019 7:30 AM Medical Record [id number] Patient Account Number: [id number] Date of Birth/Sex: Treating RN: [birth year] (34 y.o. [name] Primary Care Christohper Dube: [name], [name] Other Clinician: Referring Omunique Pederson: Treating Yuri Flener/Extender:Robson, [name], [name]: 208 Edema Assessment Assessed: [Left: No] [Right: No] Edema: [Left: Yes] [Right: Yes] Calf Left: Right: Point of Measurement: 33 cm From Medial Instep 31 cm 33 cm Ankle Left:  Right: Point of Measurement: 10 cm From Medial Instep 25.5 cm 23 cm Electronic Signature(s) Signed: 12/31/2019 5:29:28 PM By: Carlene Coria RN Entered By: Carlene Coria on 12/31/2019 08:23:13 -------------------------------------------------------------------------------- Multi Wound Chart Details Patient Name: Date of Service: [name] 12/31/2019 7:30 AM Medical Record [id number] Patient Account Number: [id number] Date of Birth/Sex: Treating RN: [birth year] (34 y.o. M) Primary Care Katilynn Sinkler: [name], [name] Other Clinician: Referring Neri Vieyra: Treating Birgitta Uhlir/Extender:Robson, [name], [name]: 208 Vital Signs Height(in): 70 Pulse(bpm): 101 Weight(lbs): 216 Blood Pressure(mmHg): 119/72 Body Mass Index(BMI): 31 Temperature(F): 98.1 Respiratory 20 Rate(breaths/min): Photos: [24:No Photos] [35:No Photos] [37:No Photos] Wound Location: [24:Left, Dorsal Foot] [35:Left Ischium] [37:Left,  Lateral Malleolus] Wounding Event: [24:Gradually Appeared] [35:Gradually Appeared] [37:Gradually Appeared] Primary Etiology: [24:Inflammatory] [35:Pressure Ulcer] [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] Comorbid History: [24:Sleep Apnea, Hypertension, Sleep Apnea, Hypertension, Sleep Apnea, Hypertension, Paraplegia] [35:Paraplegia] [37:Paraplegia] Date Acquired: [24:03/08/2018] [35:04/28/2019] [37:11/26/2019] Weeks of Treatment: [24:94] [35:35] [37:5] Wound Status: [24:Open] [35:Open] [37:Open] Clustered Wound: [24:Yes] [35:Yes] [37:No] Clustered Quantity: [24:1] [35:1] [37:N/A] Measurements L x W x D 3x2.5x0.3 [35:4x1.5x0.2] [37:2x0.6x0.2] (cm) Area (cm) : [24:5.89] [id number] [37:0.942] Volume (cm) : [24:1.767] [35:0.942] [37:0.188] % Reduction in Area: [24:-2395.80%] [35:66.70%] [37:76.20%] % Reduction in Volume: -7262.50% [35:33.40%] [37:52.50%] Classification: [24:Full Thickness Without Exposed Support Structures] [35:Category/Stage III] [37:Full Thickness Without Exposed Support  Structures] Exudate Amount: [24:Large] [35:Medium] [37:Medium] Exudate Type: [24:Serosanguineous] [35:Serosanguineous] [37:Serosanguineous] Exudate Color: [24:red, brown] [35:red, brown] [37:red, brown] Wound Margin: [24:Distinct, outline attached Flat and Intact] [37:Distinct, outline attached] Granulation Amount: [24:Small (1-33%)] [35:Large (67-100%)] [37:Medium (34-66%)] Granulation Quality: [24:Red] [35:Red, Friable] [37:Pink, Pale] Necrotic Amount: [24:Large (67-100%)] [35:None Present (0%)] [37:Medium (34-66%)] Exposed Structures: [24:Fat Layer (Subcutaneous Tissue) Exposed: Yes Fascia: No Tendon: No Muscle: No Joint: No Bone: No] [35:Fat Layer (Subcutaneous Tissue) Exposed: Yes Fascia: No Tendon: No Muscle: No Joint: No Bone: No] [37:Fat Layer (Subcutaneous Tissue) Exposed: Yes  Fascia: No Tendon: No Muscle: No Joint: No Bone: No] Epithelialization: [24:None] [35:Small (1-33%)] [37:Small (1-33%)] Debridement: [24:Debridement - Excisional] [35:N/A] [37:Debridement - Selective/Open Wound] Pre-procedure [24:08:38] [35:N/A] [37:08:38] Verification/Time Out Taken: Tissue Debrided: [24:Subcutaneous, Slough] [35:N/A] [37:N/A] Level: [24:Skin/Subcutaneous Tissue] [35:N/A] [37:Skin/Epidermis] Debridement Area (sq cm):7.5 [35:N/A] [37:1.2] Instrument: [24:Curette] [35:N/A] [37:Curette] Bleeding: [24:None] [35:N/A] [37:None] Procedural Pain: [24:0] [35:N/A] [37:0] Post Procedural Pain: [24:0] [35:N/A] [37:0] Debridement Treatment Procedure was tolerated [35:N/A] [37:Procedure was tolerated] Response: [24:well] [37:well] Post Debridement [24:3x2.5x0.3] [35:N/A] [37:2x0.6x0.2] Measurements L x W x D (cm) Post Debridement [24:1.767] [35:N/A] [37:0.188] Volume: (cm) Procedures Performed: Debridement [24:38] [35:N/A N/A] [37:Compression Therapy Debridement N/A] Photos: [24:No Photos] [35:N/A] [37:N/A] Wound Location: [24:Right Toe - Web between N/A 1st and 2nd] [37:N/A] Wounding Event:  [24:Gradually Appeared] [35:N/A] [37:N/A] Primary Etiology: [24:Inflammatory] [35:N/A] [37:N/A] Comorbid History: [24:Sleep Apnea, Hypertension, N/A Paraplegia] [37:N/A] Date Acquired: [24:11/30/2019] [35:N/A] [37:N/A] Weeks of Treatment: [24:4] [35:N/A] [37:N/A] Wound Status: [24:Open] [35:N/A] [37:N/A] Clustered Wound: [24:No] [35:N/A] [37:N/A] Clustered Quantity: [24:N/A] [35:N/A] [37:N/A] Measurements L x W x D 5x3.5x0.4 [35:N/A] [37:N/A] (cm) Area (cm) : [24:13.744] [35:N/A] [37:N/A] Volume (cm) : [24:5.498] [35:N/A] [37:N/A] % Reduction in Area: [24:-4064.80%] [35:N/A] [37:N/A] % Reduction in Volume: -2280.10% [35:N/A] [37:N/A] Classification: [24:Full Thickness Without Exposed Support Structures] [35:N/A] [37:N/A] Exudate Amount: [24:Medium] [35:N/A] [37:N/A] Exudate Type: [24:Serosanguineous] [35:N/A] [37:N/A] Exudate Color: [24:red, brown] [35:N/A] [37:N/A] Wound Margin: [24:Distinct, outline attached N/A] [37:N/A] Granulation Amount: [24:Medium (34-66%)] [35:N/A] [37:N/A] Granulation Quality: [24:Pink, Pale] [35:N/A] [37:N/A] Necrotic Amount: [24:Medium (34-66%)] [35:N/A] [37:N/A] Exposed Structures: [24:Fat Layer (Subcutaneous Tissue) Exposed: Yes Fascia: No Tendon: No Muscle: No Joint: No Bone: No] [35:N/A] [37:N/A] Epithelialization: [24:None] [35:N/A] [37:N/A] Debridement: [24:N/A] [35:N/A] [37:N/A] Tissue Debrided: [24:N/A] [35:N/A] [37:N/A] Level: [24:N/A] [35:N/A] [37:N/A] Debridement Area (sq cm):N/A [35:N/A] [37:N/A] Instrument: [24:N/A] [35:N/A] [37:N/A] Bleeding: [24:N/A] [35:N/A] [37:N/A] Procedural Pain: [24:N/A] [35:N/A] [37:N/A] Post Procedural Pain: [24:N/A] [35:N/A] [37:N/A] Debridement Treatment N/A [35:N/A] [37:N/A] Response: Post Debridement [24:N/A] [35:N/A] [37:N/A] Measurements L x W x D (cm) Post Debridement [24:N/A] [35:N/A] [37:N/A] Volume: (cm) Procedures Performed: N/A [35:N/A] [37:N/A] Treatment Notes Wound #24 (Left, Dorsal Foot) 1.  Cleanse With Wound Cleanser 2. Periwound Care Antifungal cream Barrier cream Moisturizing lotion 3. Primary Dressing Applied Other primary dressing (specifiy in notes) 4. Secondary Dressing ABD Pad Heel Cup 6. Support Layer Applied 3 layer compression  wrap Notes sorbact with thin layer of hydrogel. antfungal cream to toes Wound #35 (Left Ischium) 1. Cleanse With Wound Cleanser 2. Periwound Care Antifungal cream Barrier cream 3. Primary Dressing Applied Hydrofera Blue 4. Secondary Dressing Foam Wound #37 (Left, Lateral Malleolus) 1. Cleanse With Wound Cleanser 2. Periwound Care TCA Cream 3. Primary Dressing Applied Calcium Alginate Ag 4. Secondary Dressing Dry Gauze 6. Support Layer Applied 3 layer compression wrap Wound #38 (Right Toe - Web between 1st and 2nd) 1. Cleanse With Wound Cleanser 2. Periwound Care Antifungal cream 3. Primary Dressing Applied Calcium Alginate Ag 4. Secondary Dressing Dry Gauze Roll Gauze 5. Secured With Tape Notes Development worker, international aid) Signed: 12/31/2019 6:01:37 PM By: Linton Ham MD Entered By: Linton Ham on 12/31/2019 09:31:16 -------------------------------------------------------------------------------- Multi-Disciplinary Care Plan Details Patient Name: Date of Service: [name] 12/31/2019 7:30 AM Medical Record [id number] Patient Account Number: [id number] Date of Birth/Sex: Treating RN: 1985/12/13 (34 y.o. [name] Primary Care Daimian Sudberry: [name], [name] Other Clinician: Referring Tashera Montalvo: Treating Wynelle Dreier/Extender:Robson, [name], [name]: 208 Active Inactive Wound/Skin Impairment Nursing Diagnoses: Impaired tissue integrity Knowledge deficit related to ulceration/compromised skin integrity Goals: Patient/caregiver will verbalize understanding of skin care regimen Date Initiated: 01/05/2016 Target Resolution Date: 01/18/2020 Goal Status:  Active Ulcer/skin breakdown will have a volume reduction of 30% by week 4 Date Initiated: 01/05/2016 Date Inactivated: 12/22/2017 Target Resolution Date: 01/19/2018 Unmet Reason: complex Goal Status: Unmet wounds, infection Interventions: Assess patient/caregiver ability to obtain necessary supplies Assess ulceration(s) every visit Provide education on ulcer and skin care Notes: Electronic Signature(s) Signed: 01/01/2020 5:30:42 PM By: Levan Hurst RN, BSN Entered By: Levan Hurst on 12/31/2019 07:58:32 -------------------------------------------------------------------------------- Pain Assessment Details Patient Name: Date of Service: [name], [name] 12/31/2019 7:30 AM Medical Record [id number] Patient Account Number: [id number] Date of Birth/Sex: Treating RN: 06/08/1986 (34 y.o. M) Primary Care Zasha Belleau: [place], [place] Other Clinician: Referring Shaquella Stamant: Treating Ankit Degregorio/Extender:Robson, [name], [name]: 208 Active Problems Location of Pain Severity and Description of Pain Patient Has Paino No Site Locations Pain Management and Medication Current Pain Management: Electronic Signature(s) Signed: 03/05/2020 9:23:26 AM By: Sandre Kitty Entered By: Sandre Kitty on 12/31/2019 08:00:50 -------------------------------------------------------------------------------- Patient/Caregiver Education Details Patient Name: Date of Service: [name] 2/8/2021andnbsp7:30 AM Medical Record [id number] Patient Account Number: [id number] Date of Birth/Gender: September 24, 1986 (33 y.o. M) Treating RN: Levan Hurst Primary Care Physician: Janine Limbo Other Clinician: Referring Physician: Treating Physician/Extender:Robson, Pecola Leisure in Treatment: 208 Education Assessment Education Provided To: Patient Education Topics Provided Wound/Skin Impairment: Methods: Explain/Verbal Responses: State content correctly Electronic  Signature(s) Signed: 01/01/2020 5:30:42 PM By: Levan Hurst RN, BSN Entered By: Levan Hurst on 12/31/2019 07:58:53 -------------------------------------------------------------------------------- Wound Assessment Details Patient Name: Date of Service: [name], [name] 12/31/2019 7:30 AM Medical Record [id number] Patient Account Number: [id number] Date of Birth/Sex: Treating RN: 1986-07-18 (34 y.o. [name] Primary Care Liesel Peckenpaugh: [place], [place] Other Clinician: Referring Tay Whitwell: Treating Codylee Patil/Extender:Robson, [name], [name]: 208 Wound Status Wound Number: 24 Primary Etiology: Inflammatory Wound Location: Left Foot - Dorsal Wound Status: Open Wounding Event: Gradually Appeared Comorbid Sleep Apnea, Hypertension, History: Paraplegia Date Acquired: 03/08/2018 Weeks Of Treatment: 94 Clustered Wound: Yes Photos Wound Measurements Length: (cm) 3 % Reduct Width: (cm) 2.5 % Reduct Depth: (cm) 0.3 Epitheli Clustered Quantity: 1 Tunnelin Area: (cm) 5.89 Undermi Volume: (cm) 1.767 Wound Description Full Thickness Without Exposed Support Foul Odo Classification: Structures Slough/F Wound Distinct, outline attached Margin: Exudate Large Amount: Exudate Serosanguineous Type: Exudate  red, brown Color: Wound Bed Granulation Amount: Small (1-33%) Granulation Quality: Red Fascia E Necrotic Amount: Large (67-100%) Fat Laye Necrotic Quality: Adherent Slough Tendon E Muscle E Joint Ex Bone Exp r After Cleansing: No ibrino Yes Exposed Structure xposed: No r (Subcutaneous Tissue) Exposed: Yes xposed: No xposed: No posed: No osed: No ion in Area: -2395.8% ion in Volume: -7262.5% alization: None g: No ning: No Electronic Signature(s) Signed: 01/02/2020 4:31:42 PM By: Mikeal Hawthorne EMT/HBOT Signed: 01/02/2020 4:53:37 PM By: Carlene Coria RN Previous Signature: 12/31/2019 5:29:28 PM Version By: Carlene Coria RN Entered By: Mikeal Hawthorne on 01/02/2020 13:45:08 -------------------------------------------------------------------------------- Wound Assessment Details Patient Name: Date of Service: [name], [name] 12/31/2019 7:30 AM Medical Record [id number] Patient Account Number: [id number] Date of Birth/Sex: Treating RN: Jun 16, 1986 (34 y.o. [name] Primary Care Ahmari Garton: [place], [place] Other Clinician: Referring Shakil Dirk: Treating Mayling Aber/Extender:Robson, [name], [name]: 208 Wound Status Wound Number: 35 Primary Etiology: Pressure Ulcer Wound Location: Left Ischium Wound Status: Open Wounding Event: Gradually Appeared Comorbid Sleep Apnea, Hypertension, History: Paraplegia Date Acquired: 04/28/2019 Weeks Of Treatment: 35 Clustered Wound: Yes Photos Wound Measurements Length: (cm) 4 % Reducti Width: (cm) 1.5 % Reducti Depth: (cm) 0.2 Epithelia Clustered Quantity: 1 Tunneling Area: (cm) 4.712 Undermin Volume: (cm) 0.942 Wound Description Classification: Category/Stage III Wound Margin: Flat and Intact Exudate Amount: Medium Exudate Type: Serosanguineous Exudate Color: red, brown Wound Bed Granulation Amount: Large (67-100%) Granulation Quality: Red, Friable Necrotic Amount: None Present (0%) Foul Odor After Cleansing: No Slough/Fibrino No Exposed Structure Fascia Exposed: No Fat Layer (Subcutaneous Tissue) Exposed: Yes Tendon Exposed: No Muscle Exposed: No Joint Exposed: No Bone Exposed: No on in Area: 66.7% on in Volume: 33.4% lization: Small (1-33%) : No ing: No Electronic Signature(s) Signed: 01/02/2020 4:31:42 PM By: Mikeal Hawthorne EMT/HBOT Signed: 01/02/2020 4:53:37 PM By: Carlene Coria RN Previous Signature: 12/31/2019 5:29:28 PM Version By: Carlene Coria RN Entered By: Mikeal Hawthorne on 01/02/2020 13:53:02 -------------------------------------------------------------------------------- Wound Assessment Details Patient Name: Date of  Service: [name], [name] 12/31/2019 7:30 AM Medical Record [id number] Patient Account Number: [id number] Date of Birth/Sex: Treating RN: 18-Mar-1986 (34 y.o. [name] Primary Care Shiva Karis: [place], [place] Other Clinician: Referring Maribella Kuna: Treating Nunzio Banet/Extender:Robson, [name], [name]: 208 Wound Status Wound Number: 37 Primary Etiology: Venous Leg Ulcer Wound Location: Left Malleolus - Lateral Wound Status: Open Wounding Event: Gradually Appeared Comorbid Sleep Apnea, Hypertension, History: Paraplegia Date Acquired: 11/26/2019 Weeks Of Treatment: 5 Clustered Wound: No Photos Wound Measurements Length: (cm) 2 % Reduct Width: (cm) 0.6 % Reduct Depth: (cm) 0.2 Epitheli Area: (cm) 0.942 Tunneli Volume: (cm) 0.188 Undermi Wound Description Classification: Full Thickness Without Exposed Support Foul Odo Structures Slough/F Wound Distinct, outline attached Margin: Exudate Medium Amount: Exudate Serosanguineous Type: Exudate red, brown Color: Wound Bed Granulation Amount: Medium (34-66%) Granulation Quality: Pink, Pale Fascia E Necrotic Amount: Medium (34-66%) Fat Laye Necrotic Quality: Adherent Slough Tendon E Muscle E Joint Ex Bone Exp r After Cleansing: No ibrino Yes Exposed Structure xposed: No r (Subcutaneous Tissue) Exposed: Yes xposed: No xposed: No posed: No osed: No ion in Area: 76.2% ion in Volume: 52.5% alization: Small (1-33%) ng: No ning: No Electronic Signature(s) Signed: 01/02/2020 4:31:42 PM By: Mikeal Hawthorne EMT/HBOT Signed: 01/02/2020 4:53:37 PM By: Carlene Coria RN Previous Signature: 12/31/2019 5:29:28 PM Version By: Carlene Coria RN Entered By: Mikeal Hawthorne on 01/02/2020 13:45:45 -------------------------------------------------------------------------------- Wound Assessment Details Patient Name: Date of Service: [name], [name] 12/31/2019 7:30 AM Medical Record [id number] Patient  Account  Number: [id number] Date of Birth/Sex: Treating RN: [birth year] (34 y.o. [name]) Carlene Coria Primary Care Alberto Schoch: [place], [place] Other Clinician: Referring Ninoska Goswick: Treating Janae Bonser/Extender:Robson, [name], [name]: 208 Wound Status Wound Number: 38 Primary Etiology: Inflammatory Wound Location: Right Toe - Web between 1st and Wound Status: Open 2nd Comorbid Sleep Apnea, Hypertension, Wounding Event: Gradually Appeared History: Paraplegia Date Acquired: 11/30/2019 Weeks Of Treatment: 4 Clustered Wound: No Photos Wound Measurements Length: (cm) 5 % Reduct Width: (cm) 3.5 % Reduct Depth: (cm) 0.4 Epitheli Area: (cm) 13.744 Tunneli Volume: (cm) 5.498 Undermi Wound Description Classification: Full Thickness Without Exposed Support Foul Odo Structures Slough/F Wound Distinct, outline attached Margin: Exudate Medium Amount: Exudate Serosanguineous Type: Exudate red, brown Color: Wound Bed Granulation Amount: Medium (34-66%) Granulation Quality: Pink, Pale Fascia Ex Necrotic Amount: Medium (34-66%) Fat Layer Necrotic Quality: Adherent Slough Tendon Ex Muscle Ex Joint Exp Bone Expo r After Cleansing: No ibrino Yes Exposed Structure posed: No (Subcutaneous Tissue) Exposed: Yes posed: No posed: No osed: No sed: No ion in Area: -4064.8% ion in Volume: -2280.1% alization: None ng: No ning: No Electronic Signature(s) Signed: 01/02/2020 4:31:42 PM By: Mikeal Hawthorne EMT/HBOT Signed: 01/02/2020 4:53:37 PM By: Carlene Coria RN Previous Signature: 12/31/2019 5:29:28 PM Version By: Carlene Coria RN Entered By: Mikeal Hawthorne on 01/02/2020 13:52:21 -------------------------------------------------------------------------------- Vitals Details Patient Name: Date of Service: [name], [name] [name] 12/31/2019 7:30 AM Medical Record [id number] Patient Account Number: [id number] Date of Birth/Sex: Treating RN: [birth year] (34 y.o. M) Primary Care Jasraj Lappe:  [place], [place] Other Clinician: Referring Jennings Corado: Treating Zakariyya Helfman/Extender:Robson, [name], [name]: 208 Vital Signs Time Taken: 08:00 Temperature (F): 98.1 Height (in): 70 Pulse (bpm): 101 Weight (lbs): 216 Respiratory Rate (breaths/min): 20 Body Mass Index (BMI): 31 Blood Pressure (mmHg): 119/72 Reference Range: 80 - 120 mg / dl Electronic Signature(s) Signed: 03/05/2020 9:23:26 AM By: Sandre Kitty Entered By: Sandre Kitty on 12/31/2019 08:00:45

## 2020-03-10 ENCOUNTER — Encounter (HOSPITAL_BASED_OUTPATIENT_CLINIC_OR_DEPARTMENT_OTHER): Payer: BC Managed Care – PPO | Admitting: Internal Medicine

## 2020-03-10 ENCOUNTER — Other Ambulatory Visit: Payer: Self-pay

## 2020-03-10 DIAGNOSIS — Z89422 Acquired absence of other left toe(s): Secondary | ICD-10-CM | POA: Diagnosis not present

## 2020-03-10 DIAGNOSIS — L03116 Cellulitis of left lower limb: Secondary | ICD-10-CM | POA: Diagnosis not present

## 2020-03-10 DIAGNOSIS — L97521 Non-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: Secondary | ICD-10-CM | POA: Diagnosis not present

## 2020-03-10 DIAGNOSIS — I872 Venous insufficiency (chronic) (peripheral): Secondary | ICD-10-CM | POA: Diagnosis not present

## 2020-03-10 DIAGNOSIS — I87332 Chronic venous hypertension (idiopathic) with ulcer and inflammation of left lower extremity: Secondary | ICD-10-CM | POA: Diagnosis not present

## 2020-03-10 DIAGNOSIS — L97511 Non-pressure chronic ulcer of other part of right foot limited to breakdown of skin: Secondary | ICD-10-CM | POA: Diagnosis not present

## 2020-03-10 DIAGNOSIS — L97322 Non-pressure chronic ulcer of left 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: Secondary | ICD-10-CM | POA: Diagnosis not present

## 2020-03-10 DIAGNOSIS — G8221 Paraplegia, complete: Secondary | ICD-10-CM | POA: Diagnosis not present

## 2020-03-10 DIAGNOSIS — L89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, stage 3: Secondary | ICD-10-CM | POA: Diagnosis not present

## 2020-03-10 DIAGNOSIS — Z87891 Personal history of nicotine dependence: Secondary | ICD-10-CM | POA: Diagnosis not present

## 2020-03-10 DIAGNOSIS — L97321 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 to breakdown of skin: Secondary | ICD-10-CM | POA: Diagnosis not present

## 2020-03-10 DIAGNOSIS — L97522 Non-pressure chronic ulcer of other part of left foot with fat layer exposed: Secondary | ICD-10-CM | POA: Diagnosis not present

## 2020-03-10 NOTE — Progress Notes (Signed)
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 over the surface of the wound albeit not coming from the wound circumference. This is a bit of an odd situation in which the epithelialization seems to be coming from the surface of the wound rather than in the exact circumference. There is still small open areas mostly along the lateral margin of the wound. He has unchanged areas between the left first and second and the right first second toes which I been treating for tenia pedis 06/02/17; left lateral leg venous insufficiency wound. We have been using Hydrofera Blue. Somewhat smaller from the wound circumference. The surface of the wound remains a bit on it almost epithelialized sedation in appearance. I use an open curette today debridement in the surface of all of this especially the edges Small open wounds remaining on the dorsal right first and second toe interspace and the plantar left first second toe and her face on the left 06/09/17; wound on the left lateral leg continues to be smaller but very gradual and very dry surface using  Hydrofera Blue 06/16/17 requires weekly debridements now on the left lateral leg although this continues to contract. I changed to silver collagen last week because of dryness of the wound bed. Using Iodoflex to the areas on his first and second toes/web space bilaterally 06/24/17;  patient with history of paraplegia also chronic venous insufficiency with lymphedema. Has a very difficult wound on the left lateral leg. This has been gradually reducing in terms of with but comes in with a very dry adherent surface. High switch to silver collagen a week or so ago with hydrogel to keep the area moist. This is been refractory to multiple dressing attempts. He also has areas in his first and second toes bilaterally in the anterior and posterior web space. I had been using Iodoflex here after a prolonged course of silver alginate with ketoconazole was ineffective [question tinea pedis] 07/14/17; patient arrives today with a very difficult adherent material over his left lateral lower leg wound. He also has surrounding erythema and poorly controlled edema. He was switched his Santyl last visit which the nurses are applying once during his doctor visit and once on a nurse visit. He was also reduced to 2 layer compression I'm not exactly sure of the issue here. 07/21/17; better surface today after 1 week of Iodoflex. Significant cellulitis that we treated last week also better. [Doxycycline] 07/28/17 better surface today with now 2 [name] of Iodoflex. Significant cellulitis treated with doxycycline. He has now completed the doxycycline and he is back to his usual degree of chronic venous inflammation/stasis dermatitis. He reminds me he has had ablations surgery here 08/04/17; continued improvement with Iodoflex to the left lateral leg wound in terms of the surface of the wound although the dimensions are better. He is not currently on any antibiotics, he has the usual degree of chronic venous inflammation/stasis  dermatitis. Problematic areas on the plantar aspect of the first second toe web space on the left and the dorsal aspect of the first second toe web space on the right. At one point I felt these were probably related to chronic fungal infections in treated him aggressively for this although we have not made any improvement here. 08/11/17; left lateral leg. Surface continues to improve with the Iodoflex although we are not seeing much improvement in overall wound dimensions. Areas on his plantar left foot and right foot show no improvement. In fact the right foot looks somewhat worse 08/18/17; left lateral leg. We changed to [facility] Blue last week after a prolonged course of Iodoflex which helps get the surface better. It appears that the wound with is improved. Continue with difficult areas on the left dorsal first second and plantar first second on the right 09/01/17; patient arrives in clinic today having had a temperature of 103 yesterday. He was seen in the ER and [facility]. The patient was concerned he could have cellulitis again in the right leg however they diagnosed him with a UTI and he is now on Keflex. He has a history of cellulitis which is been recurrent and difficult but this is been in the left leg, in the past 5 use doxycycline. He does in and out catheterizations at home which are risk factors for UTI 09/08/17; patient will be completing his Keflex this weekend. The erythema on the left leg is considerably better. He has a new wound today on the medial part of the right leg small superficial almost looks like a skin tear. He has worsening of the area on the right dorsal first and second toe. His major area on the left lateral leg is better. Using Hydrofera Blue on all areas 09/15/17; gradual reduction in width on the long wound in the left lateral leg. No debridement required. He also has wounds on the plantar aspect  of his left first second toe web space and on the dorsal aspect  of the right first second toe web space. 09/22/17; there continues to be very gradual improvements in the dimensions of the left lateral leg wound. He hasn't round erythematous spot with might be pressure on his wheelchair. There is no evidence obviously of infection no purulence no warmth He has a dry scaled area on the plantar aspect of the left first second toe Improved area on the dorsal right first second toe. 09/29/17; left lateral leg wound continues to improve in dimensions mostly with an is still a fairly long but increasingly narrow wound. He has a dry scaled area on the plantar aspect of his left first second toe web space Increasingly concerning area on the dorsal right first second toe. In fact I am concerned today about possible cellulitis around this wound. The areas extending up his second toe and although there is deformities here almost appears to abut on the nailbed. 10/06/17; left lateral leg wound continues to make very gradual progress. Tissue culture I did from the right first second toe dorsal foot last time grew MRSA and enterococcus which was vancomycin sensitive. This was not sensitive to clindamycin or doxycycline. He is allergic to Zyvox and sulfa we have therefore arrange for him to have dalvance infusion tomorrow. He is had this in the past and tolerated it well 10/20/17; left lateral leg wound continues to make decent progress. This is certainly reduced in terms of with there is advancing epithelialization.The cellulitis in the right foot looks better although he still has a deep wound in the dorsal aspect of the first second toe web space. Plantar left first toe web space on the left I think is making some progress 10/27/17; left lateral leg wound continues to make decent progress. Advancing epithelialization.using Hydrofera Blue The right first second toe web space wound is better-looking using silver alginate Improvement in the left plantar first second toe web  space. Again using silver alginate 11/03/17 left lateral leg wound continues to make decent progress albeit slowly. Using [facility] The right per second toe web space continues to be a very problematic looking punched out wound. I obtained a piece of tissue for deep culture I did extensively treated this for fungus. It is difficult to imagine that this is a pressure area as the patient states other than going outside he doesn't really wear shoes at home The left plantar first second toe web space looked fairly senescent. Necrotic edges. This required debridement change to [facility] Blue to all wound areas 11/10/17; left lateral leg wound continues to contract. Using Hydrofera Blue On the right dorsal first second toe web space dorsally. Culture I did of this area last week grew MRSA there is not an easy oral option in this patient was multiple antibiotic allergies or intolerances. This was only a rare culture isolate I'm therefore going to use Bactroban under silver alginate On the left plantar first second toe web space. Debridement is required here. This is also unchanged 11/17/17; left lateral leg wound continues to contract using Hydrofera Blue this is no longer the major issue. The major concern here is the right first second toe web space. He now has an open area going from dorsally to the plantar aspect. There is now wound on the inner lateral part of the first toe. Not a very viable surface on this. There is erythema spreading medially into the forefoot. No major change in the left first second  toe plantar wound 11/24/17; left lateral leg wound continues to contract using Hydrofera Blue. Nice improvement today The right first second toe web space all of this looks a lot less angry than last week. I have given him clindamycin and topical Bactroban for MRSA and terbinafine for the possibility of underlining tinea pedis that I could not control with ketoconazole. Looks somewhat  better The area on the plantar left first second toe web space is weeping with dried debris around the wound 12/01/17; left lateral leg wound continues to contract he Hydrofera Blue. It is becoming thinner in terms of with nevertheless it is making good improvement. The right first second toe web space looks less angry but still a large necrotic-looking wounds starting on the plantar aspect of the right foot extending between the toes and now extensively on the base of the right second toe. I gave him clindamycin and topical Bactroban for MRSA anterior benefiting for the possibility of underlying tinea pedis. Not looking better today The area on the left first/second toe looks better. Debrided of necrotic debris 12/05/17* the patient was worked in urgently today because over the weekend he found blood on his incontinence bad when he woke up. He was found to have an ulcer by his wife who does most of his wound care. He came in today for [place] to look at this. He has not had a history of wounds in his buttocks in spite of his paraplegia. 12/08/17; seen in follow-up today at his usual appointment. He was seen earlier this week and found to have a new wound on his buttock. We also follow him for wounds on the left lateral leg, left first second toe web space and right first second toe web space 12/15/17; we have been using Hydrofera Blue to the left lateral leg which has improved. The right first second toe web space has also improved. Left first second toe web space plantar aspect looks stable. The left buttock has worsened using Santyl. Apparently the buttock has drainage 12/22/17; we have been using Hydrofera Blue to the left lateral leg which continues to improve now 2 small wounds separated by normal skin. He tells [place] he had a fever up to 100 yesterday he is prone to UTIs but has not noted anything different. He does in and out catheterizations. The area between the first and second toes today does not  look good necrotic surface covered with what looks to be purulent drainage and erythema extending into the third toe. I had gotten this to something that I thought look better last time however it is not look good today. He also has a necrotic surface over the buttock wound which is expanded. I thought there might be infection under here so I removed a lot of the surface with a #5 curet though nothing look like it really needed culturing. He is been using Santyl to this area 12/27/17; his original wound on the left lateral leg continues to improve using Hydrofera Blue. I gave him samples of Baxdella although he was unable to take them out of fear for an allergic reaction ["lump in his throat"].the culture I did of the purulent drainage from his second toe last week showed both enterococcus and a set Enterobacter I was also concerned about the erythema on the bottom of his foot although paradoxically although this looks somewhat better today. 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  We've been using silver alginate here. They came up today that he sleeps in a recliner, I'm not sure why but I asked him to stop this 01/03/18; his original wound we've been using Hydrofera Blue is now separated into 2 areas. Ulcer on his left buttock is better he is off the recliner and sleeping in bed Finally both wound areas between his first and second toes also looks some better 01/10/18; his original wound on the left lateral leg is now separated into 2 wounds we've been using Hydrofera Blue Ulcer on his left buttock has some drainage. There is a small probing site going into muscle layer superiorly.using silver alginate -He arrives today with a deep tissue injury on the left heel The wound on the dorsal aspect of his first second toe on the left looks a lot betterusing silver alginate ketoconazole The area on the first  second toe web space on the right also looks a lot bette 01/17/18; his original wound on the left lateral leg continues to progress using Hydrofera Blue Ulcer on his left buttock also is smaller surface healthier except for a small probing site going into the muscle layer superiorly. 2.4 cm of tunneling in this area DTI on his left heel we have only been offloading. Looks better than last week no threatened open no evidence of infection the wound on the dorsal aspect of the first second toe on the left continues to look like it's regressing we have only been using silver alginate and terbinafine orally The area in the first second toe web space on the right also looks to be a lot better using silver alginate and terbinafine I think this was prompted by tinea pedis 01/31/18; the patient was hospitalized in [place] last week apparently for a complicated UTI. He was discharged on cefepime he does in and out catheterizations. In the hospital he was discovered M I don't mild elevation of AST and ALTs and the terbinafine was stopped.predictably the pressure ulcer on his buttock looks betterusing silver alginate. The area on the left lateral leg also is better using Hydrofera Blue. The area between the first and second toes on the left better. First and second toes on the right still substantial but better. Finally the DTI on the left heel has held together and looks like it's resolving 02/07/18-he is here in follow-up evaluation for multiple ulcerations. He has new injury to the lateral aspect of the last issue a pressure ulcer, he states this is from adhesive removal trauma. He states he has tried multiple adhesive products with no success. All other ulcers appear stable. The left heel DTI is resolving. We will continue with same [name] plan and follow-up next week. 02/14/18; follow-up for multiple areas. He has a new area last week on the lateral aspect of his pressure ulcer more over the posterior  trochanter. The original pressure ulcer looks quite stable has healthy granulation. 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 Almost fully epithelialized on the original superior area using Hydrofera Blue DTI on the left heel has peeled off this week to reveal a small superficial wound under denuded skin and subcutaneous tissue Both areas between the first and second toes look better including nothing open on the left 02/21/18; The patient's wounds on his left ischial tuberosity and posterior left greater trochanter actually looked better. He has a large area of irritation around the area which I  think is contact dermatitis. I am doubtful that this is fungal His original wound on the left lateral calf continues to improve we have been using Hydrofera Blue There is no open area in the left first second toe web space although there is a lot of thick callus The DTI on the left heel required debridement today of necrotic surface eschar and subcutaneous tissue using silver alginate Finally the area on the right first second toe webspace continues to contract using silver alginate and ketoconazole 02/28/18 Left ischial tuberosity wounds look better using silver alginate. Original wound on the left calf only has one small open area left using Hydrofera Blue DTI on the left heel required debridement mostly removing skin from around this wound surface. Using silver alginate The areas on the right first/second toe web space using silver alginate and ketoconazole 03/08/18 on evaluation today patient appears to be doing decently well as best I can tell in regard to his wounds. This is the first time that I have seen him as he generally is followed by Dr. Dellia Nims. With that being said none of his wounds appear to be infected he does have an area where there is some skin covering what appears to be a  new wound on the left dorsal surface of his great toe. This is right at the nail bed. With that being said I do believe that debrided away some of the excess skin can be of benefit in this regard. Otherwise he has been tolerating the dressing changes without complication. 03/14/18; patient arrives today with the multiplicity of wounds that we are following. He has not been systemically unwell Original wound on the left lateral calf now only has 2 small open areas we've been using Hydrofera Blue which should continue The deep tissue injury on the left heel requires debridement today. We've been using silver alginate The left first second toe and the right first second toe are both are reminiscence what I think was tinea pedis. Apparently some of the callus Surface between the toes was removed last week when it started draining. Purulent drainage coming from the wound on the ischial tuberosity on the left. 03/21/18-He is here in follow-up evaluation for multiple wounds. 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 He tolerated debridement. The only change to last week's recommendations is to discontinue antifungal cream between toes. He will follow-up next week 03/28/18; following up for multiple wounds;Concern this week is streaking redness and swelling in the right foot. He is going to need antibiotics for this. 03/31/18; follow-up for right foot cellulitis. Streaking redness and swelling in the right foot on 03/28/18. 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 I put him on clindamycin 300 mg every 6 and brought him in for a quick check. He has an open wound between his first and second toes on the right foot as a potential source. 04/04/18; Right foot cellulitis is resolving he is completing clindamycin. This is truly good news Left lateral calf wound which is initial wound only has one small open area  inferiorly this is close to healing out. He has compression stockings. We will use Hydrofera Blue right down to the epithelialization of this Nonviable surface on the left heel which was initially pressure with a DTI. We've been using Hydrofera Blue. I'm going to switch this back to silver alginate Left first second toe/tinea pedis this looks better using silver alginate Right first second toe tinea  pedis using silver alginate Large pressure ulcers on theLeft ischial tuberosity. Small wound here Looks better. I am uncertain about the surface over the large wound. Using silver alginate 04/11/18; Cellulitis in the right foot is resolved Left lateral calf wound which was his original wounds still has 2 tiny open areas remaining this is just about closed Nonviable surface on the left heel is better but still requires debridement Left first second toe/tinea pedis still open using silver alginate Right first second toe wound tinea pedis I asked him to go back to using ketoconazole and silver alginate Large pressure ulcers on the left ischial tuberosity this shear injury here is resolved. Wound is smaller. No evidence of infection using silver alginate 04/18/18; Patient arrives with an intense area of cellulitis in the right mid lower calf extending into the right heel area. Bright red and warm. Smaller area on the left anterior leg. He has a significant history of MRSA. He will definitely need antibioticsdoxycycline He now has 2 open areas on the left ischial tuberosity the original large wound and now a satellite area which I think was above his initial satellite areas. Not a wonderful surface on this satellite area surrounding erythema which looks like pressure related. His left lateral calf wound again his original wound is just about closed Left heel pressure injury still requiring debridement Left first second toe looks a lot better using silver alginate Right first second toe also using  silver alginate and ketoconazole cream also looks better 04/20/18; the patient was worked in early today out of concerns with his cellulitis on the right leg. I had started him on doxycycline. This was 2 days ago. His wife was concerned about the swelling in the area. Also concerned about the left buttock. He has not been systemically unwell no fever chills. No nausea vomiting or diarrhea 04/25/18; the patient's left buttock wound is continued to deteriorate he is using Hydrofera Blue. He is still completing clindamycin for the cellulitis on the right leg although all of this looks better. 05/02/18 Left buttock wound still with a lot of drainage and a very tightly adherent fibrinous necrotic surface. He has a deeper area superiorly The left lateral calf wound is still closed DTI wound on the left heel necrotic surface especially the circumference using Iodoflex Areas between his left first second toe and right first second toe both look better. Dorsally and the right first second toe he had a necrotic surface although at smaller. In using silver alginate and ketoconazole. I did a culture last week which was a deep tissue culture of the reminiscence of the open wound on the right first second toe dorsally. This grew a few Acinetobacter and a few methicillin-resistant staph aureus. Nevertheless the area actually this week looked better. I didn't feel the need to specifically address this at least in terms of systemic antibiotics. 05/09/18; wounds are measuring larger more drainage per our intake. We are using Santyl covered with alginate on the large superficial buttock wounds, Iodosorb on the left heel, ketoconazole and silver alginate to the dorsal first and second toes bilaterally. 05/16/18; The area on his left buttock better in some aspects although the area superiorly over the ischial tuberosity required an extensive debridement.using Santyl Left heel appears stable. Using Iodoflex The areas  between his first and second toes are not bad however there is spreading erythema up the dorsal aspect of his left foot this looks like cellulitis again. He is insensate the erythema is really very brilliant.o  Erysipelas He went to [name] allergist days ago because he was itching part of this he had lab work done. This showed a white count of 15.1 with 70% neutrophils. Hemoglobin of 11.4 and a platelet count of 659,000. Last white count we had in Epic was a 2-1/2 years ago which was 25.9 but he was ill at the time. He was able to show me some lab work that was done by his primary physician the pattern is about the same. 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 But prompted me to go ahead and do x-rays of both feet and the pelvis rule out osteomyelitis. He also had a comprehensive metabolic panel this was reasonably normal his albumin was 3.7 liver function tests BUN/creatinine all normal 05/23/18; x-rays of both his feet from last week were negative for underlying pulmonary abnormality. The x-ray of his pelvis however showed mild irregularity in the left ischial which may represent some early osteomyelitis. The wound in the left ischial continues to get deeper clearly now exposed muscle. Each week necrotic surface material over this area. Whereas the rest of the wounds do not look so bad. The left ischial wound we have been using Santyl and calcium alginate To the left heel surface necrotic debris using Iodoflex The left lateral leg is still healed Areas on the left dorsal foot and the right dorsal foot are about the same. There is some inflammation on the left which might represent contact dermatitis, fungal dermatitis I am doubtful cellulitis although this looks better than last week 05/30/18; CT scan done at Hospital did not show any osteomyelitis or abscess. Suggested the possibility of underlying cellulitis although I don't [name] a lot of evidence of  this at the bedside The wound itself on the left buttock/upper thigh actually looks somewhat better. No debridement Left heel also looks better no debridement continue Iodoflex Both dorsal first second toe spaces appear better using Lotrisone. Left still required debridement 06/06/18; Intake reported some purulent looking drainage from the left gluteal wound. Using Santyl and calcium alginate Left heel looks better although still a nonviable surface requiring debridement The left dorsal foot first/second webspace actually expanding and somewhat deeper. I may consider doing a shave biopsy of this area Right dorsal foot first/second webspace appears stable to improved. Using Lotrisone and silver alginate to both these areas 06/13/18 Left gluteal surface looks better. Now separated in the 2 wounds. No debridement required. Still drainage. We'll continue silver alginate Left heel continues to look better with Iodoflex continue this for at least another week Of his dorsal foot wounds the area on the left still has some depth although it looks better than last week. We've been using Lotrisone and silver alginate 06/20/18 Left gluteal continues to look better healthy tissue Left heel continues to look better healthy granulation wound is smaller. He is using Iodoflex and his long as this continues continue the Iodoflex Dorsal right foot looks better unfortunately dorsal left foot does not. There is swelling and erythema of his forefoot. He had minor trauma to this several days ago but doesn't think this was enough to have caused any tissue injury. Foot looks like cellulitis, we have had this problem before 06/27/18 on evaluation today patient appears to be doing a little worse in regard to his foot ulcer. 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 Both of which  would really be his only options for treating this infection. In the past he has been given and effusion of Orbactiv. This is done very well for him in the past again it's one time dosing IV antibiotic therapy. Subsequently I do believe this is something we're gonna need to [name] about doing at this point in time. Currently his other wounds seem to be doing somewhat better in my pinion I'm pretty happy in that regard. 07/03/18 on evaluation today patient's wounds actually appear to be doing fairly well. He has been tolerating the dressing changes without complication. All in all he seems to be showing signs of improvement. In regard to the antibiotics he has been dealing with infectious disease since I saw him last week as far as getting this scheduled. In the end [facility]'s [facility] this coming Friday. In the meantime he has been continuing to perform the dressing changes in such as previous. There does not appear to be any evidence of infection worsengin at this time. 07/10/18; Since I last saw this man 2 [name] ago things have actually improved. IV antibiotics of resulted in less forefoot erythema although there is still some present. He is not systemically unwell Left buttock wounds 2 now have no depth there is increased epithelialization Using silver alginate Left heel still requires debridement using Iodoflex Left dorsal foot still with a sizable wound about the size of a border but healthy granulation Right dorsal foot still with a slitlike area using silver alginate 07/18/18; the patient's cellulitis in the left foot is improved in fact I think it is on its way to resolving. Left buttock wounds 2 both look better although the larger one has hypertension granulation we've been using silver alginate Left heel has some thick circumferential redundant skin over the wound edge which will need to be removed today we've been using Iodoflex Left  dorsal foot is still a sizable wound required debridement using silver alginate The right dorsal foot is just about closed only a small open area remains here 07/25/18; left foot cellulitis is resolved Left buttock wounds 2 both look better. Hyper-granulation on the major area Left heel as some debris over the surface but otherwise looks a healthier wound. Using silver collagen Right dorsal foot is just about closed 07/31/18; arrives with our intake nurse worried about purulent drainage from the buttock. We had hyper-granulation here last week His buttock wounds 2 continue to look better Left heel some debris over the surface but measuring smaller. Right dorsal foot unfortunately has openings between the toes Left foot superficial wound looks less aggravated. 08/07/18 Buttock wounds continue to look better although some of her granulation and the larger medial wound. silver alginate Left heel continues to look a lot better.silver collagen Left foot superficial wound looks less stable. Requires debridement. He has a new wound superficial area on the foot on the lateral dorsal foot. Right foot looks better using silver alginate without Lotrisone 08/14/2018; patient was in the ER last week diagnosed with a UTI. He is now on Cefpodoxime and Macrodantin. Buttock wounds continued to be smaller. Using silver alginate Left heel continues to look better using silver collagen Left foot superficial wound looks as though it is improving Right dorsal foot area is just about healed. 08/21/2018; patient is completed his antibiotics for his UTI. He has 2 open areas on the buttocks. There  is still not closed although the surface looks satisfactory. Using silver alginate Left heel continues to improve using silver collagen The bilateral dorsal foot areas which are at the base of his first and second toes/possible tinea pedis are actually stable on the left but worse on the right. The area on the left required  debridement of necrotic surface. After debridement I obtained a specimen for PCR culture. The right dorsal foot which is been just about healed last week is now reopened 08/28/2018; culture done on the left dorsal foot showed coag negative staph both staph epidermidis and Lugdunensis. I think this is worthwhile initiating systemic [name]. I will use doxycycline given his long list of allergies. The area on the left heel slightly improved but still requiring debridement. The large wound on the buttock is just about closed whereas the smaller one is larger. Using silver alginate in this area 09/04/2018; patient is completing his doxycycline for the left foot although this continues to be a very difficult wound area with very adherent necrotic debris. We are using silver alginate to all his wounds right foot left foot and the small wounds on his buttock, silver collagen on the left heel. 09/11/2018; once again this patient has intense erythema and swelling of the left forefoot. Lesser degrees of erythema in the right foot. He has a long list of allergies and intolerances. I will reinstitute doxycycline. 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 Using silver alginate Left heel also looks better using silver collagen Unfortunately both the areas on his feet look worse. The area on the left first second webspace is now gone through to the plantar part of his foot. The area on the left foot anteriorly is irritated with erythema and swelling in the forefoot. 09/25/2018 His wound on the left plantar heel looks better. Using silver collagen The area on the left buttock 2 small remnant areas. One is closed one is still open. Using silver alginate The areas between both his first and second toes look worse. This in spite of long-standing antifungal therapy with ketoconazole and silver alginate which should have antifungal activity He has small areas around  his original wound on the left calf one is on the bottom of the original scar tissue and one superiorly both of these are small and superficial but again given wound history in this site this is worrisome 10/02/2018 Left plantar heel continues to gradually contract using silver collagen Left buttock wound is unchanged using silver alginate The areas on his dorsal feet between his first and second toes bilaterally look about the same. I prescribed clindamycin ointment to [name] if we can address chronic staph colonization and also the underlying possibility of erythrasma The left lateral lower extremity wound is actually on the lateral part of his ankle. Small open area here. We have been using silver alginate 10/09/2018; Left plantar heel continues to look healthy and contract. No debridement is required Left buttock slightly smaller with a tape injury wound just below which was new this week Dorsal feet somewhat improved I have been using clindamycin Left lateral looks lower extremity the actual open area looks worse although a lot of this is epithelialized. I am going to change to silver collagen today He has a lot more swelling in the right leg although this is not pitting not red and not particularly warm there is a lot of spasm in the right leg usually indicative of people with  paralysis of some underlying discomfort. We have reviewed his vascular status from 2017 he had a left greater saphenous vein ablation. I wonder about referring him back to vascular surgery if the area on the left leg continues to deteriorate. 10/16/2018 in today for follow-up and management of multiple lower extremity ulcers. His left Buttock wound is much lower smaller and almost closed completely. The wound to the left ankle has began to reopen with Epithelialization and some adherent slough. He has multiple new areas to the left foot and leg. The left dorsal foot without much improvement. Wound present between left  great webspace and 2nd toe. Erythema and edema present right leg. Right LE ultrasound obtained on 10/10/18 was negative for DVT. 10/23/2018; Left buttock is closed over. Still dry macerated skin but there is no open wound. I suspect this is chronic pressure/moisture Left lateral calf is quite a bit worse than when I saw this last. There is clearly drainage here he has macerated skin into the left plantar heel. We will change the primary dressing to alginate Left dorsal foot has some improvement in overall wound area. Still using clindamycin and silver alginate Right dorsal foot about the same as the left using clindamycin and silver alginate The erythema in the right leg has resolved. He is DVT rule out was negative Left heel pressure area required debridement although the wound is smaller and the surface is health 10/26/2018 The patient came back in for his nurse check today predominantly because of the drainage coming out of the left lateral leg with a recent reopening of his original wound on the left lateral calf. He comes in today with a large amount of surrounding erythema around the wound extending from the calf into the ankle and even in the area on the dorsal foot. He is not systemically unwell. He is not febrile. Nevertheless this looks like cellulitis. We have been using silver alginate to the area. I changed him to a regular visit and I am going to prescribe him doxycycline. The rationale here is a long list of medication intolerances and a history of MRSA. I did not [name] anything that I thought would provide a valuable culture 10/30/2018 Follow-up from his appointment 4 days ago with really an extensive area of cellulitis in the left calf left lateral ankle and left dorsal foot. I put him on doxycycline. He has a long list of medication allergies which are true allergy reactions. 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 In any case he is a lot better today. The erythema especially in the anterior and lateral left calf is better. He still has left ankle erythema. He also is complaining about increasing edema in the right leg we have only been using Kerlix Coban and he has been doing the wraps at home. Finally he has a spotty rash on the medial part of his upper left calf which looks like folliculitis or perhaps wrap occlusion type injury. Small superficial macules not pustules 11/06/18 patient arrives today with again a considerable degree of erythema around the wound on the left lateral calf extending into the dorsal ankle and dorsal foot. This is a lot worse than when I saw this last week. He is on doxycycline really with not a lot of improvement. He has not been systemically unwell Wounds on the; left heel actually looks improved. Original area on the left foot and proximity to the first and second toes  looks about the same. He has superficial areas on the dorsal foot, anterior calf and then the reopening of his original wound on the left lateral calf which looks about the same The only area he has on the right is the dorsal webspace first and second which is smaller. He has a large area of dry erythematous skin on the left buttock small open area here. 11/13/2018; the patient arrives in much better condition. The erythema around the wound on the left lateral calf is a lot better. Not sure whether this was the clindamycin or the TCA and ketoconazole or just in the improvement in edema control [stasis dermatitis]. In any case this is a lot better. The area on the left heel is very small and just about resolved using silver collagen we have been using silver alginate to the areas on his dorsal feet 11/20/2018; his wounds include the left lateral calf, left heel, dorsal aspects of both feet just proximal to the first second webspace. He is stable to slightly improved. I did not think any  changes to his dressings were going to be necessary 11/27/2018 he has a reopening on the left buttock which is surrounded by what looks like tinea or perhaps some other form of dermatitis. The area on the left dorsal foot has some erythema around it I have marked this area but I am not sure whether this is cellulitis or not. Left heel is not closed. Left calf the reopening is really slightly longer and probably worse 1/13; in general things look better and smaller except for the left dorsal foot. Area on the left heel is just about closed, left buttock looks better only a small wound remains in the skin looks better [using Lotrisone] 1/20; the area on the left heel only has a few remaining open areas here. Left lateral calf about the same in terms of size, left dorsal foot slightly larger right lateral foot still not closed. The area on the left buttock has no open wound and the surrounding skin looks a lot better 1/27; the area on the left heel is closed. Left lateral calf better but still requiring extensive debridements. The area on his left buttock is closed. He still has the open areas on the left dorsal foot which is slightly smaller in the right foot which is slightly expanded. We have been using Iodoflex on these areas as well 2/3; left heel is closed. Left lateral calf still requiring debridement using Iodoflex there is no open area on his left buttock however he has dry scaly skin over a large area of this. Not really responding well to the Lotrisone. Finally the areas on his dorsal feet at the level of the first second webspace are slightly smaller on the right and about the same on the left. Both of these vigorously debrided with Anasept and gauze 2/10; left heel remains closed he has dry erythematous skin over the left buttock but there is no open wound here. Left lateral leg has come in and with. Still requiring debridement we have been using Iodoflex here. Finally the area on the  left dorsal foot and right dorsal foot are really about the same extremely dry callused fissured areas. He does not yet have a dermatology appointment 2/17; left heel remains closed. He has a new open area on the left buttock. The area on the left lateral calf is bigger longer and still covered in necrotic debris. No major change in his foot areas bilaterally. I am awaiting  for a dermatologist to look on this. We have been using ketoconazole I do not know that this is been doing any good at all. 2/24; left heel remains closed. The left buttock wound that was new reopening last week looks better. The left lateral calf appears better also although still requires debridement. The major area on his foot is the left first second also requiring debridement. We have been putting Prisma on all wounds. I do not believe that the ketoconazole has done too much good for his feet. He will use Lotrisone I am going to give him a 2-week course of terbinafine. We still do not have a dermatology appointment 3/2 left heel remains closed however there is skin over bone in this area I pointed this out to him today. The left buttock wound is epithelialized but still does not look completely stable. The area on the left leg required debridement were using silver collagen here. With regards to his feet we changed to Lotrisone last week and silver alginate. 3/9; left heel remains closed. Left buttock remains closed. The area on the right foot is essentially closed. The left foot remains unchanged. Slightly smaller on the left lateral calf. Using silver collagen to both of these areas 3/16-Left heel remains closed. Area on right foot is closed. Left lateral calf above the lateral malleolus open wound requiring debridement with easy bleeding. Left dorsal wound proximal to first toe also debrided. Left ischial area open new. Patient has been using Prisma with wrapping every 3 days. Dermatology appointment is  apparently tomorrow.Patient has completed his terbinafine 2-week course with some apparent improvement according to him, there is still flaking and dry skin in his foot on the left 3/23; area on the right foot is reopened. The area on the left anterior foot is about the same still a very necrotic adherent surface. He still has the area on the left leg and reopening is on the left buttock. He apparently saw dermatology although I do not have a note. According to the patient who is usually fairly well informed they did not have any good ideas. Put him on oral terbinafine which she is been on before. 3/30; using silver collagen to all wounds. Apparently his dermatologist put him on doxycycline and rifampin presumably some culture grew staph. I do not have this result. He remains on terbinafine although I have used terbinafine on him before 4/6; patient has had a fairly substantial reopening on the right foot between the first and second toes. He is finished his terbinafine and I believe is on doxycycline and rifampin still as prescribed by dermatology. We have been using silver collagen to all his wounds although the patient reports that he thinks silver alginate does better on the wounds on his buttock. 4/13; the area on his left lateral calf about the same size but it did not require debridement. Left dorsal foot just proximal to the webspace between the first and second toes is about the same. Still nonviable surface. I note some superficial bronze discoloration of the dorsal part of his foot Right dorsal foot just proximal to the first and second toes also looks about the same. I still think there may be the same discoloration I noted above on the left Left buttock wound looks about the same 4/20; left lateral calf appears to be gradually contracting using silver collagen. He remains on erythromycin empiric [name] for possible erythrasma involving his digital spaces. The left dorsal foot  wound is debrided of tightly adherent  necrotic debris and really cleans up quite nicely. The right area is worse with expansion. I did not debride this it is now over the base of the second toe The area on his left buttock is smaller no debridement is required using silver collagen 5/4; left calf continues to make good progress. He arrives with erythema around the wounds on his dorsal foot which even extends to the plantar aspect. Very concerning for coexistent infection. He is finished the erythromycin I gave him for possible erythrasma this does not seem to have helped. The area on the left foot is about the same base of the dorsal toes Is area on the buttock looks improved on the left 5/11; left calf and left buttock continued to make good progress. Left foot is about the same to slightly improved. Major problem is on the right foot. He has not had an x-ray. Deep tissue culture I did last week showed both Enterobacter and E. coli. I did not change the doxycycline I put him on empirically although neither 1 of these were plated to doxycycline. He arrives today with the erythema looking worse on both the dorsal and plantar foot. Macerated skin on the bottom of the foot. he has not been systemically unwell 5/18-Patient returns at 1 week, left calf wound appears to be making some progress, left buttock wound appears slightly worse than last time, left foot wound looks slightly better, right foot redness is marginally better. X-ray of both feet show no air or evidence of osteomyelitis. Patient is finished his Omnicef and terbinafine. He continues to have macerated skin on the bottom of the left foot as well as right 5/26; left calf wound is better, left buttock wound appears to have multiple small superficial open areas with surrounding macerated skin. X-rays that I did last time showed no evidence of osteomyelitis in either foot. He is finished cefdinir and doxycycline. I do not think that he was  on terbinafine. He continues to have a large superficial open area on the right foot anterior dorsal and slightly between the first and second toes. I did send him to dermatology 2 months ago or so wondering about whether they would do a fungal scraping. I do not believe they did but did do a culture. We have been using silver alginate to the toe areas, he has been using antifungals at home topically either ketoconazole or Lotrisone. We are using silver collagen on the left foot, silver alginate on the right, silver collagen on the left lateral leg and silver alginate on the left buttock 6/1; left buttock area is healed. We have the left dorsal foot, left lateral leg and right dorsal foot. We are using silver alginate to the areas on both feet and silver collagen to the area on his left lateral calf 6/8; the left buttock apparently reopened late last week. He is not really sure how this happened. He is tolerating the terbinafine. Using silver alginate to all wounds 6/15; left buttock wound is larger than last week but still superficial. Came in the clinic today with a report of purulence from the left lateral leg I did not identify any infection Both areas on his dorsal feet appear to be better. He is tolerating the terbinafine. Using silver alginate to all wounds 6/22; left buttock is about the same this week, left calf quite a bit better. His left foot is about the same however he comes in with erythema and warmth in the right forefoot once again. Culture that  I gave him in the beginning of May showed Enterobacter and E. coli. I gave him doxycycline and things seem to improve although neither 1 of these organisms was specifically plated. 6/29; left buttock is larger and dry this week. Left lateral calf looks to me to be improved. Left dorsal foot also somewhat improved right foot completely unchanged. The erythema on the right foot is still present. He is completing the Ceftin dinner that I  gave him empirically [[name] discussion above.) 7/6 - All wounds look to be stable and perhaps improved, the left buttock wound is slightly smaller, per patient bleeds easily, completed ceftin, the right foot redness is less, he is on terbinafine 7/13; left buttock wound about the same perhaps slightly narrower. Area on the left lateral leg continues to narrow. Left dorsal foot slightly smaller right foot about the same. We are using silver alginate on the right foot and Hydrofera Blue to the areas on the left. Unna boot on the left 2 layer compression on the right 7/20; left buttock wound absolutely the same. Area on lateral leg continues to get better. Left dorsal foot require debridement as did the right no major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 this could've been due to extreme edema. His venous studies show reflux on both sides in the femoral popliteal veins as well as the greater and lesser saphenous veins bilaterally. Ultimately he is going to need to  [name] about this issue. Hopefully when we can get his wounds  and a little better shape. 02/19/16; the patient was able to complete a course of [place]'s for MRSA in the face of multiple antibiotic allergies. Arterial studies showed an ABI of him 0.88 on the right 1.17 on the left the. Waveforms were biphasic at the posterior tibial and dorsalis pedis digital waveforms were normal. Right toe brachial index was 1.3 limited by shaking and edema. His venous study showed widespread reflux in the left at the common femoral vein the greater and lesser saphenous vein the greater and lesser saphenous vein on the right as well as the popliteal and femoral vein. The popliteal and femoral vein on the left did not show reflux. His wounds on the right leg give healed on the left he is still using Santyl. 02/26/16; patient completed a [name] with Dalvance for MRSA in the wound with associated erythema. The erythema has not really resolved and I wonder if this is mostly venous inflammation rather than cellulitis. Still using Santyl. He is approved for Apligraf 03/04/16; there is less erythema around the wound. Both wounds require aggressive surgical debridement. Not yet ready for Apligraf 03/11/16; aggressive debridement again. Not ready for Apligraf 03/18/16 aggressive debridement again. 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 Has been to [name] he is being planned for a venous ablation 03/25/16; aggressive debridement again of both wound areas on the left lateral leg. He is due for ablation surgery on May 22. He is much closer to being ready for an Apligraf. Has a new area between the left first and second toes 04/01/16 aggressive debridement done of both wounds. The new wound at the base of between his second and first toes looks stable 04/08/16; continued aggressive debridement of both wounds on the left lower leg. He goes for his venous ablation on Monday. The new wound at the base of his first and second toes dorsally appears stable. 04/15/16;  wounds aggressively debridement although the base of this looks considerably better Apligraf #1. He had ablation surgery on Monday I'll need to research these records. We only have approval for four Apligraf's 04/22/16; the patient is here for a wound check [Apligraf last week] intake nurse concerned about erythema around the wounds. Apparently a significant degree of drainage. The patient has chronic venous inflammation which I think accounts for most of this however I was asked to look at this today 04/26/16; the patient came back for check of possible cellulitis in his left foot however the Apligraf dressing was inadvertently removed therefore we elected to prep the wound for a second Apligraf. I put him on doxycycline on 6/1 the erythema in the foot 05/03/16 we did not remove the dressing from the superior wound as this is where I put all of his last Apligraf. Surface debridement done with a curette of the lower wound which looks very healthy. The area on the left foot also looks quite satisfactory at the dorsal artery at the first and second toes 05/10/16; continue Apligraf to this. Her wound, Hydrafera to the lower wound. He has a new area on the right second toe. Left dorsal foot firstoosecond toe also looks improved 05/24/16; wound dimensions must be smaller I was able to use Apligraf to all 3 remaining wound areas. 06/07/16 patient's last Apligraf was 2 [name] ago. He arrives today with the 2 wounds on his lateral left leg joined together. This would have to be seen as a negative.  He also has a small wound in his first and second toe on the left dorsally with quite a bit of surrounding erythema in the first second and third toes. This looks to be infected or inflamed, very difficult clinical call. 06/21/16: lateral left leg combined wounds. Adherent surface slough area on the left dorsal foot at roughly the fourth toe looks improved 07/12/16; he now has a single linear wound on the lateral left  leg. This does not look to be a lot changed from when I lost saw this. The area on his dorsal left foot looks considerably better however. 08/02/16; no major change in the substantial area on his left lateral leg since last time. We have been using Hydrofera Blue for a prolonged period of time now. The area on his left foot is also unchanged from last review 07/19/16; the area on his dorsal foot on the left looks considerably smaller. He is beginning to have significant rims of epithelialization on the lateral left leg wound. This also looks better. 08/05/16; the patient came in for a nurse visit today. Apparently the area on his left lateral leg looks better and it was wrapped. However in general discussion the patient noted a new area on the dorsal aspect of his right second toe. The exact etiology of this is unclear but likely relates to pressure. 08/09/16 really the area on the left lateral leg did not really look that healthy today perhaps slightly larger and measurements. The area on his dorsal right second toe is improved also the left foot wound looks stable to improved 08/16/16; the area on the last lateral leg did not change any of dimensions. Post debridement with a curet the area looked better. Left foot wound improved and the area on the dorsal right second toe is improved 08/23/16; the area on the left lateral leg may be slightly smaller both in terms of length and width. Aggressive debridement with a curette afterwards the tissue appears healthier. Left foot wound appears improved in the area on the dorsal right second toe is improved 08/30/16 patient developed a fever over the weekend and was seen in an urgent care. Felt to have a UTI and put on doxycycline. He has been since changed over the phone to [facility]. After we took off the wrap on his right leg today the leg is swollen warm and erythematous, probably more likely the source of the fever 09/06/16; have been using collagen to the  major left leg wound, silver alginate to the area on his anterior foot/toes 09/13/16; the areas on his anterior foot/toes on both sides appear to be virtually closed. Extensive wound on the left lateral leg perhaps slightly narrower but each visit still covered an adherent surface slough 09/16/16 patient was in for his usual Thursday nurse visit however the intake nurse noted significant erythema of his dorsal right foot. He is also running a low-grade fever and having increasing spasms in the right leg 09/20/16 here for cellulitis involving his right great toes and forefoot. This is a lot better. Still requiring debridement on his left lateral leg. Santyl direct says he needs prior authorization. Therefore his wife cannot change this at home 09/30/16; the patient's extensive area on the left lateral calf and ankle perhaps somewhat better. Using Santyl. The area on the left toes is healed and I think the area on his right dorsal foot is healed as well. There is no cellulitis or venous inflammation involving the right leg. He is going to need  compression stockings here. 10/07/16; the patient's extensive wound on the left lateral calf and ankle does not measure any differently however there appears to be less adherent surface slough using Santyl and aggressive weekly debridements 10/21/16; no major change in the area on the left lateral calf. Still the same measurement still very difficult to debridement adherent slough and nonviable subcutaneous tissue. This is not really been helped by several [name] of Santyl. Previously for 2 [name] I used Iodoflex for a short period. A prolonged course of Hydrofera Blue didn't really help. I'm not sure why I only used 2 [name] of Iodoflex on this there is no evidence of surrounding infection. He has a small area on the right second toe which looks as though it's progressing towards closure 10/28/16; the wounds on his toes appear to be closed. No major change in the  left lateral leg wound although the surface looks somewhat better using Iodoflex. He has had previous arterial studies that were normal. He has had reflux studies and is status post ablation although I don't have any exact notes on which vein was ablated. I'll need to check the surgical record 11/04/16; he's had a reopening between the first and second toe on the left and right. No major change in the left lateral leg wound. There is what appears to be cellulitis of the left dorsal foot 11/18/16 the patient was hospitalized initially in [place] and then subsequently transferred to [facility] long and was admitted there from 11/09/16 through 11/12/16. He had developed progressive cellulitis on the right leg in spite of the doxycycline I gave him. I'd spoken to the hospitalist in [place] who was concerned about continuing leukocytosis. CT scan is what I suggested this was done which showed soft tissue swelling without evidence of osteomyelitis or an underlying abscess blood cultures were negative. At [facility] he was treated with vancomycin and Primaxin and then add an infectious disease consult. He was transitioned to Ceftaroline. He has been making progressive improvement. Overall a severe cellulitis of the right leg. He is been using silver alginate to her original wound on the left leg. The wounds in his toes on the right are closed there is a small open area on the base of the left second toe 11/26/15; the patient's right leg is much better although there is still some edema here this could be reminiscent from his severe cellulitis likely on top of some degree of lymphedema. His left anterior leg wound has less surface slough as reported by her intake nurse. Small wound at the base of the left second toe 12/02/16; patient's right leg is better and there is no open wound here. His left anterior lateral leg wound continues to have a healthy-looking surface. Small wound at the base of the left second  toe however there is erythema in the left forefoot which is worrisome 12/16/16; is no open wounds on his right leg. We took measurements for stockings. His left anterior lateral leg wound continues to have a healthy-looking surface. I'm not sure where we were with the Apligraf run through his insurance. We have been using Iodoflex. He has a thick eschar on the left first second toe interface, I suspect this may be fungal however there is no visible open 12/23/16; no open wound on his right leg. He has 2 small areas left of the linear wound that was remaining last week. We have been using Prisma, I thought I have disclosed this week, we can only look forward to next  week 01/03/17; the patient had concerning areas of erythema last week, already on doxycycline for UTI through his primary doctor. The erythema is absolutely no better there is warmth and swelling both medially from the left lateral leg wound and also the dorsal left foot. 01/06/17- Patient is here for follow-up evaluation of his left lateral leg ulcer and bilateral feet ulcers. He is on oral antibiotic therapy, tolerating that. Nursing staff and the patient states that the erythema is improved from Monday. 01/13/17; the predominant left lateral leg wound continues to be problematic. I had put Apligraf on him earlier this month once. However he subsequently developed what appeared to be an intense cellulitis around the left lateral leg wound. I gave him Dalvance I think on 2/12 perhaps 2/13 he continues on cefdinir. The erythema is still present but the warmth and swelling is improved. I am hopeful that the cellulitis part of this control. I wouldn't be surprised if there is an element of venous inflammation as well. 01/17/17. The erythema is present but better in the left leg. His left lateral leg wound still does not have a viable surface buttons certain parts of this long thin wound it appears like there has been improvement in  dimensions. 01/20/17; the erythema still present but much better in the left leg. I'm thinking this is his usual degree of chronic venous inflammation. The wound on the left leg looks somewhat better. Is less surface slough 01/27/17; erythema is back to the chronic venous inflammation. The wound on the left leg is somewhat better. I am back to the point where I like to try an Apligraf once again 02/10/17; slight improvement in wound dimensions. Apligraf #2. He is completing his doxycycline 02/14/17; patient arrives today having completed doxycycline last Thursday. This was supposed to be a nurse visit however once again he hasn't tense erythema from the medial part of his wound extending over the lower leg. Also erythema in his foot this is roughly in the same distribution as last time. He has baseline chronic venous inflammation however this is a lot worse than the baseline I have learned to accept the on him is baseline inflammation 02/24/17- patient is here for follow-up evaluation. He is tolerating compression therapy. His voicing no complaints or concerns he is here anticipating an Apligraf 03/03/17; he arrives today with an adherent necrotic surface. I don't think this is surface is going to be amenable for Apligraf's. The erythema around his wound and on the left dorsal foot has resolved he is off antibiotics 03/10/17; better-looking surface today. I don't think he can tolerate Apligraf's. He tells me he had a wound VAC after a skin graft years ago to this area and they had difficulty with a seal. The erythema continues to be stable around this some degree of chronic venous inflammation but he also has recurrent cellulitis. We have been using Iodoflex 03/17/17; continued improvement in the surface and may be small changes in dimensions. Using Iodoflex which seems the only thing that will control his surface 03/24/17- He is here for follow up evaluation of his LLE lateral ulceration and ulcer to right  dorsal foot/toe space. He is voicing no complaints or concerns, He is tolerating compression wrap. 03/31/17 arrives today with a much healthier looking wound on the left lower extremity. We have been using Iodoflex for a prolonged period of time which has for the first time prepared and adequate looking wound bed although we have not had much in the way of wound  dimension improvement. He also has a small wound between the first and second toe on the right 04/07/17; arrives today with a healthy-looking wound bed and at least the top 50% of this wound appears to be now her. No debridement was required I have changed him to [facility] last week after prolonged Iodoflex. He did not do well with Apligraf's. We've had a re-opening between the first and second toe on the right 04/14/17; arrives today with a healthier looking wound bed contractions and the top 50% of this wound and some on the lesser 50%. Wound bed appears healthy. The area between the first and second toe on the right still remains problematic 04/21/17; continued very gradual improvement. Using [facility] 04/28/17; continued very gradual improvement in the left lateral leg venous insufficiency wound. His periwound erythema is very mild. We have been using Hydrofera Blue. Wound is making progress especially in the superior 50% 05/05/17; he continues to have very gradual improvement in the left lateral venous insufficiency wound. Both in terms with an length rings are improving. I debrided this every 2 [name] with #5 curet and we have been using Hydrofera Blue and again making good progress With regards to the wounds between his right first and second toe which I thought might of been tinea pedis he is not making as much progress very dry scaly skin over the area. Also the area at the base of the left first and second toe in a similar condition 05/12/17; continued gradual improvement in the refractory left lateral venous insufficiency  wound on the left. Dimension smaller. Surface still requiring debridement using Hydrofera Blue 05/19/17; continued gradual improvement in the refractory left lateral venous ulceration. Careful inspection of the wound bed underlying rumination suggested some degree of epithelialization over the surface no debridement indicated. Continue Hydrofera Blue difficult areas between his toes first and third on the left than first and second on the right. I'm going to change to silver alginate from silver collagen. Continue ketoconazole as I suspect underlying tinea pedis 05/26/17; left lateral leg venous insufficiency wound. We've been using Hydrofera Blue. I believe that there is expanding epithelialization over the surface of the wound albeit not coming from the wound circumference. This is a bit of an odd situation in which the epithelialization seems to be coming from the surface of the wound rather than in the exact circumference. There is still small open areas mostly along the lateral margin of the wound. ooHe has unchanged areas between the left first and second and the right first second toes which I been treating for tenia pedis 06/02/17; left lateral leg venous insufficiency wound. We have been using Hydrofera Blue. Somewhat smaller from the wound circumference. The surface of the wound remains a bit on it almost epithelialized sedation in appearance. I use an open curette today debridement in the surface of all of this especially the edges ooSmall open wounds remaining on the dorsal right first and second toe interspace and the plantar left first second toe and her face on the left 06/09/17; wound on the left lateral leg continues to be smaller but very gradual and very dry surface using Hydrofera Blue 06/16/17 requires weekly debridements now on the left lateral leg although this continues to contract. I changed to silver collagen last week because of dryness of the wound bed. Using Iodoflex to  the areas on his first and second toes/web space bilaterally 06/24/17; patient with history of paraplegia also chronic venous insufficiency with lymphedema. Has a  very difficult wound on the left lateral leg. This has been gradually reducing in terms of with but comes in with a very dry adherent surface. High switch to silver collagen a week or so ago with hydrogel to keep the area moist. This is been refractory to multiple dressing attempts. He also has areas in his first and second toes bilaterally in the anterior and posterior web space. I had been using Iodoflex here after a prolonged course of silver alginate with ketoconazole was ineffective [question tinea pedis] 07/14/17; patient arrives today with a very difficult adherent material over his left lateral lower leg wound. He also has surrounding erythema and poorly controlled edema. He was switched his Santyl last visit which the nurses are applying once during his doctor visit and once on a nurse visit. He was also reduced to 2 layer compression I'm not exactly sure of the issue here. 07/21/17; better surface today after 1 week of Iodoflex. Significant cellulitis that we treated last week also better. [Doxycycline] 07/28/17 better surface today with now 2 [name] of Iodoflex. Significant cellulitis treated with doxycycline. He has now completed the doxycycline and he is back to his usual degree of chronic venous inflammation/stasis dermatitis. He reminds me he has had ablations surgery here 08/04/17; continued improvement with Iodoflex to the left lateral leg wound in terms of the surface of the wound although the dimensions are better. 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 Problematic areas on the plantar aspect of the first second toe web space on the left and the dorsal aspect of the first second toe web space on the right. At one point I felt these were probably  related to chronic fungal infections in treated him aggressively for this although we have not made any improvement here. 08/11/17; left lateral leg. Surface continues to improve with the Iodoflex although we are not seeing much improvement in overall wound dimensions. Areas on his plantar left foot and right foot show no improvement. In fact the right foot looks somewhat worse 08/18/17; left lateral leg. We changed to [facility] Blue last week after a prolonged course of Iodoflex which helps get the surface better. It appears that the wound with is improved. Continue with difficult areas on the left dorsal first second and plantar first second on the right 09/01/17; patient arrives in clinic today having had a temperature of 103 yesterday. He was seen in the ER and [facility]. The patient was concerned he could have cellulitis again in the right leg however they diagnosed him with a UTI and he is now on Keflex. He has a history of cellulitis which is been recurrent and difficult but this is been in the left leg, in the past 5 use doxycycline. He does in and out catheterizations at home which are risk factors for UTI 09/08/17; patient will be completing his Keflex this weekend. The erythema on the left leg is considerably better. He has a new wound today on the medial part of the right leg small superficial almost looks like a skin tear. He has worsening of the area on the right dorsal first and second toe. His major area on the left lateral leg is better. Using Hydrofera Blue on all areas 09/15/17; gradual reduction in width on the long wound in the left lateral leg. No debridement required. He also has wounds on the plantar aspect of his left first second toe web space and on the dorsal aspect  of the right first second toe web space. 09/22/17; there continues to be very gradual improvements in the dimensions of the left lateral leg wound. He hasn't round erythematous spot with might be pressure on  his wheelchair. There is no evidence obviously of infection no purulence no warmth ooHe has a dry scaled area on the plantar aspect of the left first second toe ooImproved area on the dorsal right first second toe. 09/29/17; left lateral leg wound continues to improve in dimensions mostly with an is still a fairly long but increasingly narrow wound. ooHe has a dry scaled area on the plantar aspect of his left first second toe web space ooIncreasingly concerning area on the dorsal right first second toe. In fact I am concerned today about possible cellulitis around this wound. The areas extending up his second toe and although there is deformities here almost appears to abut on the nailbed. 10/06/17; left lateral leg wound continues to make very gradual progress. Tissue culture I did from the right first second toe dorsal foot last time grew MRSA and enterococcus which was vancomycin sensitive. This was not sensitive to clindamycin or doxycycline. He is allergic to Zyvox and sulfa we have therefore arrange for him to have dalvance infusion tomorrow. He is had this in the past and tolerated it well 10/20/17; left lateral leg wound continues to make decent progress. This is certainly reduced in terms of with there is advancing epithelialization.ooThe cellulitis in the right foot looks better although he still has a deep wound in the dorsal aspect of the first second toe web space. Plantar left first toe web space on the left I think is making some progress 10/27/17; left lateral leg wound continues to make decent progress. Advancing epithelialization.using Hydrofera Blue ooThe right first second toe web space wound is better-looking using silver alginate ooImprovement in the left plantar first second toe web space. Again using silver alginate 11/03/17 left lateral leg wound continues to make decent progress albeit slowly. Using Hydrofera Blue ooThe right per second toe web space continues to  be a very problematic looking punched out wound. I obtained a piece of tissue for deep culture I did extensively treated this for fungus. It is difficult to imagine that this is a pressure area as the patient states other than going outside he doesn't really wear shoes at home ooThe left plantar first second toe web space looked fairly senescent. Necrotic edges. This required debridement oochange to Hydrofera Blue to all wound areas 11/10/17; left lateral leg wound continues to contract. Using Hydrofera Blue ooOn the right dorsal first second toe web space dorsally. Culture I did of this area last week grew MRSA there is not an easy oral option in this patient was multiple antibiotic allergies or intolerances. This was only a rare culture isolate I'm therefore going to use Bactroban under silver alginate ooOn the left plantar first second toe web space. Debridement is required here. This is also unchanged 11/17/17; left lateral leg wound continues to contract using Hydrofera Blue this is no longer the major issue. ooThe major concern here is the right first second toe web space. He now has an open area going from dorsally to the plantar aspect. There is now wound on the inner lateral part of the first toe. Not a very viable surface on this. There is erythema spreading medially into the forefoot. ooNo major change in the left first second toe plantar wound 11/24/17; left lateral leg wound continues to contract using Hydrofera  Blue. Nice improvement today ooThe right first second toe web space all of this looks a lot less angry than last week. I have given him clindamycin and topical Bactroban for MRSA and terbinafine for the possibility of underlining tinea pedis that I could not control with ketoconazole. Looks somewhat better ooThe area on the plantar left first second toe web space is weeping with dried debris around the wound 12/01/17; left lateral leg wound continues to contract he  Hydrofera Blue. It is becoming thinner in terms of with nevertheless it is making good improvement. ooThe right first second toe web space looks less angry but still a large necrotic-looking wounds starting on the plantar aspect of the right foot extending between the toes and now extensively on the base of the right second toe. I gave him clindamycin and topical Bactroban for MRSA anterior benefiting for the possibility of underlying tinea pedis. Not looking better today ooThe area on the left first/second toe looks better. Debrided of necrotic debris 12/05/17* the patient was worked in urgently today because over the weekend he found blood on his incontinence bad when he woke up. He was found to have an ulcer by his wife who does most of his wound care. He came in today for [place] to look at this. He has not had a history of wounds in his buttocks in spite of his paraplegia. 12/08/17; seen in follow-up today at his usual appointment. He was seen earlier this week and found to have a new wound on his buttock. We also follow him for wounds on the left lateral leg, left first second toe web space and right first second toe web space 12/15/17; we have been using Hydrofera Blue to the left lateral leg which has improved. The right first second toe web space has also improved. Left first second toe web space plantar aspect looks stable. The left buttock has worsened using Santyl. Apparently the buttock has drainage 12/22/17; we have been using Hydrofera Blue to the left lateral leg which continues to improve now 2 small wounds separated by normal skin. He tells [place] he had a fever up to 100 yesterday he is prone to UTIs but has not noted anything different. He does in and out catheterizations. The area between the first and second toes today does not look good necrotic surface covered with what looks to be purulent drainage and erythema extending into the third toe. I had gotten this to something that I  thought look better last time however it is not look good today. He also has a necrotic surface over the buttock wound which is expanded. I thought there might be infection under here so I removed a lot of the surface with a #5 curet though nothing look like it really needed culturing. He is been using Santyl to this area 12/27/17; his original wound on the left lateral leg continues to improve using Hydrofera Blue. I gave him samples of Baxdella although he was unable to take them out of fear for an allergic reaction ["lump in his throat"].the culture I did of the purulent drainage from his second toe last week showed both enterococcus and a set Enterobacter I was also concerned about the erythema on the bottom of his foot although paradoxically although this looks somewhat better today. 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 We've been using silver alginate here. They came up today that he sleeps  in a recliner, I'm not sure why but I asked him to stop this 01/03/18; his original wound we've been using Hydrofera Blue is now separated into 2 areas. ooUlcer on his left buttock is better he is off the recliner and sleeping in bed ooFinally both wound areas between his first and second toes also looks some better 01/10/18; his original wound on the left lateral leg is now separated into 2 wounds we've been using Hydrofera Blue ooUlcer on his left buttock has some drainage. There is a small probing site going into muscle layer superiorly.using silver alginate -He arrives today with a deep tissue injury on the left heel ooThe wound on the dorsal aspect of his first second toe on the left looks a lot betterusing silver alginate ketoconazole ooThe area on the first second toe web space on the right also looks a lot bette 01/17/18; his original wound on the left lateral leg continues to progress using  Hydrofera Blue ooUlcer on his left buttock also is smaller surface healthier except for a small probing site going into the muscle layer superiorly. 2.4 cm of tunneling in this area ooDTI on his left heel we have only been offloading. Looks better than last week no threatened open no evidence of infection oothe wound on the dorsal aspect of the first second toe on the left continues to look like it's regressing we have only been using silver alginate and terbinafine orally ooThe area in the first second toe web space on the right also looks to be a lot better using silver alginate and terbinafine I think this was prompted by tinea pedis 01/31/18; the patient was hospitalized in [place] last week apparently for a complicated UTI. He was discharged on cefepime he does in and out catheterizations. In the hospital he was discovered M I don't mild elevation of AST and ALTs and the terbinafine was stopped.predictably the pressure ulcer on his buttock looks betterusing silver alginate. The area on the left lateral leg also is better using Hydrofera Blue. The area between the first and second toes on the left better. First and second toes on the right still substantial but better. Finally the DTI on the left heel has held together and looks like it's resolving 02/07/18-he is here in follow-up evaluation for multiple ulcerations. He has new injury to the lateral aspect of the last issue a pressure ulcer, he states this is from adhesive removal trauma. He states he has tried multiple adhesive products with no success. All other ulcers appear stable. The left heel DTI is resolving. We will continue with same [name] plan and follow-up next week. 02/14/18; follow-up for multiple areas. ooHe has a new area last week on the lateral aspect of his pressure ulcer more over the posterior trochanter. The original pressure ulcer looks quite stable has healthy granulation. 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 Almost fully epithelialized on the original superior area using Hydrofera Blue ooDTI on the left heel has peeled off this week to reveal a small superficial wound under denuded skin and subcutaneous tissue ooBoth areas between the first and second toes look better including nothing open on the left 02/21/18; ooThe patient's wounds on his left ischial tuberosity and posterior left greater trochanter actually looked better. He has a large area of irritation around the area which I think is contact dermatitis. I am doubtful that this is fungal ooHis original  wound on the left lateral calf continues to improve we have been using Hydrofera Blue ooThere is no open area in the left first second toe web space although there is a lot of thick callus ooThe DTI on the left heel required debridement today of necrotic surface eschar and subcutaneous tissue using silver alginate ooFinally the area on the right first second toe webspace continues to contract using silver alginate and ketoconazole 02/28/18 ooLeft ischial tuberosity wounds look better using silver alginate. ooOriginal wound on the left calf only has one small open area left using Hydrofera Blue ooDTI on the left heel required debridement mostly removing skin from around this wound surface. Using silver alginate ooThe areas on the right first/second toe web space using silver alginate and ketoconazole 03/08/18 on evaluation today patient appears to be doing decently well as best I can tell in regard to his wounds. This is the first time that I have seen him as he generally is followed by Dr. Dellia Nims. With that being said none of his wounds appear to be infected he does have an area where there is some skin covering what appears to be a new wound on the left dorsal surface of his great toe. This is right at the nail bed. With  that being said I do believe that debrided away some of the excess skin can be of benefit in this regard. Otherwise he has been tolerating the dressing changes without complication. 03/14/18; patient arrives today with the multiplicity of wounds that we are following. He has not been systemically unwell ooOriginal wound on the left lateral calf now only has 2 small open areas we've been using Hydrofera Blue which should continue ooThe deep tissue injury on the left heel requires debridement today. We've been using silver alginate ooThe left first second toe and the right first second toe are both are reminiscence what I think was tinea pedis. Apparently some of the callus Surface between the toes was removed last week when it started draining. ooPurulent drainage coming from the wound on the ischial tuberosity on the left. 03/21/18-He is here in follow-up evaluation for multiple wounds. 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 He tolerated debridement. The only change to last week's recommendations is to discontinue antifungal cream between toes. He will follow-up next week 03/28/18; following up for multiple wounds;Concern this week is streaking redness and swelling in the right foot. He is going to need antibiotics for this. 03/31/18; follow-up for right foot cellulitis. Streaking redness and swelling in the right foot on 03/28/18. 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 I put him on clindamycin 300 mg every 6 and brought him in for a quick check. He has an open wound between his first and second toes on the right foot as a potential source. 04/04/18; ooRight foot cellulitis is resolving he is completing clindamycin. This is truly good news ooLeft lateral calf wound which is initial wound only has one small open area inferiorly this is close to healing out. He has compression stockings. We will  use Hydrofera Blue right down to the epithelialization of this ooNonviable surface on the left heel which was initially pressure with a DTI. We've been using Hydrofera Blue. I'm going to switch this back to silver alginate ooLeft first second toe/tinea pedis this looks better using silver alginate ooRight first second toe tinea pedis using silver alginate ooLarge pressure ulcers on theLeft ischial tuberosity. Small wound  here Looks better. I am uncertain about the surface over the large wound. Using silver alginate 04/11/18; ooCellulitis in the right foot is resolved ooLeft lateral calf wound which was his original wounds still has 2 tiny open areas remaining this is just about closed ooNonviable surface on the left heel is better but still requires debridement ooLeft first second toe/tinea pedis still open using silver alginate ooRight first second toe wound tinea pedis I asked him to go back to using ketoconazole and silver alginate ooLarge pressure ulcers on the left ischial tuberosity this shear injury here is resolved. Wound is smaller. No evidence of infection using silver alginate 04/18/18; ooPatient arrives with an intense area of cellulitis in the right mid lower calf extending into the right heel area. Bright red and warm. Smaller area on the left anterior leg. He has a significant history of MRSA. He will definitely need antibioticsoodoxycycline ooHe now has 2 open areas on the left ischial tuberosity the original large wound and now a satellite area which I think was above his initial satellite areas. Not a wonderful surface on this satellite area surrounding erythema which looks like pressure related. ooHis left lateral calf wound again his original wound is just about closed ooLeft heel pressure injury still requiring debridement ooLeft first second toe looks a lot better using silver alginate ooRight first second toe also using silver alginate and ketoconazole  cream also looks better 04/20/18; the patient was worked in early today out of concerns with his cellulitis on the right leg. I had started him on doxycycline. This was 2 days ago. His wife was concerned about the swelling in the area. Also concerned about the left buttock. He has not been systemically unwell no fever chills. No nausea vomiting or diarrhea 04/25/18; the patient's left buttock wound is continued to deteriorate he is using Hydrofera Blue. He is still completing clindamycin for the cellulitis on the right leg although all of this looks better. 05/02/18 ooLeft buttock wound still with a lot of drainage and a very tightly adherent fibrinous necrotic surface. He has a deeper area superiorly ooThe left lateral calf wound is still closed ooDTI wound on the left heel necrotic surface especially the circumference using Iodoflex ooAreas between his left first second toe and right first second toe both look better. Dorsally and the right first second toe he had a necrotic surface although at smaller. In using silver alginate and ketoconazole. I did a culture last week which was a deep tissue culture of the reminiscence of the open wound on the right first second toe dorsally. This grew a few Acinetobacter and a few methicillin-resistant staph aureus. Nevertheless the area actually this week looked better. I didn't feel the need to specifically address this at least in terms of systemic antibiotics. 05/09/18; wounds are measuring larger more drainage per our intake. We are using Santyl covered with alginate on the large superficial buttock wounds, Iodosorb on the left heel, ketoconazole and silver alginate to the dorsal first and second toes bilaterally. 05/16/18; ooThe area on his left buttock better in some aspects although the area superiorly over the ischial tuberosity required an extensive debridement.using Santyl ooLeft heel appears stable. Using Iodoflex ooThe areas between his  first and second toes are not bad however there is spreading erythema up the dorsal aspect of his left foot this looks like cellulitis again. He is insensate the erythema is really very brilliant.o Erysipelas He went to [name] allergist days ago because he was itching  part of this he had lab work done. This showed a white count of 15.1 with 70% neutrophils. Hemoglobin of 11.4 and a platelet count of 659,000. Last white count we had in Epic was a 2-1/2 years ago which was 25.9 but he was ill at the time. He was able to show me some lab work that was done by his primary physician the pattern is about the same. 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 But prompted me to go ahead and do x-rays of both feet and the pelvis rule out osteomyelitis. He also had a comprehensive metabolic panel this was reasonably normal his albumin was 3.7 liver function tests BUN/creatinine all normal 05/23/18; x-rays of both his feet from last week were negative for underlying pulmonary abnormality. The x-ray of his pelvis however showed mild irregularity in the left ischial which may represent some early osteomyelitis. The wound in the left ischial continues to get deeper clearly now exposed muscle. Each week necrotic surface material over this area. Whereas the rest of the wounds do not look so bad. ooThe left ischial wound we have been using Santyl and calcium alginate ooTo the left heel surface necrotic debris using Iodoflex ooThe left lateral leg is still healed ooAreas on the left dorsal foot and the right dorsal foot are about the same. There is some inflammation on the left which might represent contact dermatitis, fungal dermatitis I am doubtful cellulitis although this looks better than last week 05/30/18; CT scan done at Hospital did not show any osteomyelitis or abscess. Suggested the possibility of underlying cellulitis although I don't [name] a lot of evidence of  this at the bedside ooThe wound itself on the left buttock/upper thigh actually looks somewhat better. No debridement ooLeft heel also looks better no debridement continue Iodoflex ooBoth dorsal first second toe spaces appear better using Lotrisone. Left still required debridement 06/06/18; ooIntake reported some purulent looking drainage from the left gluteal wound. Using Santyl and calcium alginate ooLeft heel looks better although still a nonviable surface requiring debridement ooThe left dorsal foot first/second webspace actually expanding and somewhat deeper. I may consider doing a shave biopsy of this area ooRight dorsal foot first/second webspace appears stable to improved. Using Lotrisone and silver alginate to both these areas 06/13/18 ooLeft gluteal surface looks better. Now separated in the 2 wounds. No debridement required. Still drainage. We'll continue silver alginate ooLeft heel continues to look better with Iodoflex continue this for at least another week ooOf his dorsal foot wounds the area on the left still has some depth although it looks better than last week. We've been using Lotrisone and silver alginate 06/20/18 ooLeft gluteal continues to look better healthy tissue ooLeft heel continues to look better healthy granulation wound is smaller. He is using Iodoflex and his long as this continues continue the Iodoflex ooDorsal right foot looks better unfortunately dorsal left foot does not. There is swelling and erythema of his forefoot. He had minor trauma to this several days ago but doesn't think this was enough to have caused any tissue injury. Foot looks like cellulitis, we have had this problem before 06/27/18 on evaluation today patient appears to be doing a little worse in regard to his foot ulcer. Unfortunately it does appear that he has methicillin-resistant staph aureus and unfortunately there really are no oral options for him as he's allergic to sulfa  drugs as well as I box. Both of which would really  be his only options for treating this infection. In the past he has been given and effusion of Orbactiv. This is done very well for him in the past again it's one time dosing IV antibiotic therapy. Subsequently I do believe this is something we're gonna need to [name] about doing at this point in time. Currently his other wounds seem to be doing somewhat better in my pinion I'm pretty happy in that regard. 07/03/18 on evaluation today patient's wounds actually appear to be doing fairly well. He has been tolerating the dressing changes without complication. All in all he seems to be showing signs of improvement. In regard to the antibiotics he has been dealing with infectious disease since I saw him last week as far as getting this scheduled. In the end [facility]'s [facility] this coming Friday. In the meantime he has been continuing to perform the dressing changes in such as previous. There does not appear to be any evidence of infection worsengin at this time. 07/10/18; ooSince I last saw this man 2 [name] ago things have actually improved. IV antibiotics of resulted in less forefoot erythema although there is still some present. He is not systemically unwell ooLeft buttock wounds o2 now have no depth there is increased epithelialization Using silver alginate ooLeft heel still requires debridement using Iodoflex ooLeft dorsal foot still with a sizable wound about the size of a border but healthy granulation ooRight dorsal foot still with a slitlike area using silver alginate 07/18/18; the patient's cellulitis in the left foot is improved in fact I think it is on its way to resolving. ooLeft buttock wounds o2 both look better although the larger one has hypertension granulation we've been using silver alginate ooLeft heel has some thick circumferential redundant skin over the wound edge which  will need to be removed today we've been using Iodoflex ooLeft dorsal foot is still a sizable wound required debridement using silver alginate ooThe right dorsal foot is just about closed only a small open area remains here 07/25/18; left foot cellulitis is resolved ooLeft buttock wounds o2 both look better. Hyper-granulation on the major area ooLeft heel as some debris over the surface but otherwise looks a healthier wound. Using silver collagen ooRight dorsal foot is just about closed 07/31/18; arrives with our intake nurse worried about purulent drainage from the buttock. We had hyper-granulation here last week ooHis buttock wounds o2 continue to look better ooLeft heel some debris over the surface but measuring smaller. ooRight dorsal foot unfortunately has openings between the toes ooLeft foot superficial wound looks less aggravated. 08/07/18 ooButtock wounds continue to look better although some of her granulation and the larger medial wound. silver alginate ooLeft heel continues to look a lot better.silver collagen ooLeft foot superficial wound looks less stable. Requires debridement. He has a new wound superficial area on the foot on the lateral dorsal foot. ooRight foot looks better using silver alginate without Lotrisone 08/14/2018; patient was in the ER last week diagnosed with a UTI. He is now on Cefpodoxime and Macrodantin. ooButtock wounds continued to be smaller. Using silver alginate ooLeft heel continues to look better using silver collagen ooLeft foot superficial wound looks as though it is improving ooRight dorsal foot area is just about healed. 08/21/2018; patient is completed his antibiotics for his UTI. ooHe has 2 open areas on the buttocks. There is still not closed although the surface looks satisfactory. Using silver alginate ooLeft  heel continues to improve using silver collagen ooThe bilateral dorsal foot areas which are at the base of his first  and second toes/possible tinea pedis are actually stable on the left but worse on the right. The area on the left required debridement of necrotic surface. After debridement I obtained a specimen for PCR culture. ooThe right dorsal foot which is been just about healed last week is now reopened 08/28/2018; culture done on the left dorsal foot showed coag negative staph both staph epidermidis and Lugdunensis. I think this is worthwhile initiating systemic [name]. I will use doxycycline given his long list of allergies. The area on the left heel slightly improved but still requiring debridement. ooThe large wound on the buttock is just about closed whereas the smaller one is larger. Using silver alginate in this area 09/04/2018; patient is completing his doxycycline for the left foot although this continues to be a very difficult wound area with very adherent necrotic debris. We are using silver alginate to all his wounds right foot left foot and the small wounds on his buttock, silver collagen on the left heel. 09/11/2018; once again this patient has intense erythema and swelling of the left forefoot. Lesser degrees of erythema in the right foot. He has a long list of allergies and intolerances. I will reinstitute doxycycline. 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 Using silver alginate ooLeft heel also looks better using silver collagen ooUnfortunately both the areas on his feet look worse. The area on the left first second webspace is now gone through to the plantar part of his foot. The area on the left foot anteriorly is irritated with erythema and swelling in the forefoot. 09/25/2018 ooHis wound on the left plantar heel looks better. Using silver collagen ooThe area on the left buttock 2 small remnant areas. One is closed one is still open. Using silver alginate ooThe areas between both his first and second toes look worse. This  in spite of long-standing antifungal therapy with ketoconazole and silver alginate which should have antifungal activity ooHe has small areas around his original wound on the left calf one is on the bottom of the original scar tissue and one superiorly both of these are small and superficial but again given wound history in this site this is worrisome 10/02/2018 ooLeft plantar heel continues to gradually contract using silver collagen ooLeft buttock wound is unchanged using silver alginate ooThe areas on his dorsal feet between his first and second toes bilaterally look about the same. 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 Small open area here. We have been using silver alginate 10/09/2018; ooLeft plantar heel continues to look healthy and contract. No debridement is required ooLeft buttock slightly smaller with a tape injury wound just below which was new this week ooDorsal feet somewhat improved I have been using clindamycin ooLeft lateral looks lower extremity the actual open area looks worse although a lot of this is epithelialized. I am going to change to silver collagen today He has a lot more swelling in the right leg although this is not pitting not red and not particularly warm there is a lot of spasm in the right leg usually indicative of people with paralysis of some underlying discomfort. We have reviewed his vascular status from 2017  he had a left greater saphenous vein ablation. I wonder about referring him back to vascular surgery if the area on the left leg continues to deteriorate. 10/16/2018 in today for follow-up and management of multiple lower extremity ulcers. His left Buttock wound is much lower smaller and almost closed completely. The wound to the left ankle has began to reopen  with Epithelialization and some adherent slough. He has multiple new areas to the left foot and leg. The left dorsal foot without much improvement. Wound present between left great webspace and 2nd toe. Erythema and edema present right leg. Right LE ultrasound obtained on 10/10/18 was negative for DVT. 10/23/2018; ooLeft buttock is closed over. Still dry macerated skin but there is no open wound. I suspect this is chronic pressure/moisture ooLeft lateral calf is quite a bit worse than when I saw this last. There is clearly drainage here he has macerated skin into the left plantar heel. We will change the primary dressing to alginate ooLeft dorsal foot has some improvement in overall wound area. Still using clindamycin and silver alginate ooRight dorsal foot about the same as the left using clindamycin and silver alginate ooThe erythema in the right leg has resolved. He is DVT rule out was negative ooLeft heel pressure area required debridement although the wound is smaller and the surface is health 10/26/2018 ooThe patient came back in for his nurse check today predominantly because of the drainage coming out of the left lateral leg with a recent reopening of his original wound on the left lateral calf. He comes in today with a large amount of surrounding erythema around the wound extending from the calf into the ankle and even in the area on the dorsal foot. He is not systemically unwell. He is not febrile. Nevertheless this looks like cellulitis. We have been using silver alginate to the area. I changed him to a regular visit and I am going to prescribe him doxycycline. The rationale here is a long list of medication intolerances and a history of MRSA. I did not [name] anything that I thought would provide a valuable culture 10/30/2018 ooFollow-up from his appointment 4 days ago with really an extensive area of cellulitis in the left calf left lateral ankle and left dorsal foot. I put him  on doxycycline. He has a long list of medication allergies which are true allergy reactions. 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 In any case he is a lot better today. The erythema especially in the anterior and lateral left calf is better. He still has left ankle erythema. He also is complaining about increasing edema in the right leg we have only been using Kerlix Coban and he has been doing the wraps at home. Finally he has a spotty rash on the medial part of his upper left calf which looks like folliculitis or perhaps wrap occlusion type injury. Small superficial macules not pustules 11/06/18 patient arrives today with again a considerable degree of erythema around the wound on the left lateral calf extending into the dorsal ankle and dorsal foot. This is a lot worse than when I saw this last week. He is on doxycycline really with not a lot of improvement. He has not been systemically unwell Wounds on the; left heel actually looks improved. Original area on the left foot and proximity to the first and second toes looks about the same. He has superficial areas on the dorsal foot, anterior  calf and then the reopening of his original wound on the left lateral calf which looks about the same ooThe only area he has on the right is the dorsal webspace first and second which is smaller. ooHe has a large area of dry erythematous skin on the left buttock small open area here. 11/13/2018; the patient arrives in much better condition. The erythema around the wound on the left lateral calf is a lot better. Not sure whether this was the clindamycin or the TCA and ketoconazole or just in the improvement in edema control [stasis dermatitis]. In any case this is a lot better. The area on the left heel is very small and just about resolved using silver collagen we have been using silver alginate to the areas on his dorsal  feet 11/20/2018; his wounds include the left lateral calf, left heel, dorsal aspects of both feet just proximal to the first second webspace. He is stable to slightly improved. I did not think any changes to his dressings were going to be necessary 11/27/2018 he has a reopening on the left buttock which is surrounded by what looks like tinea or perhaps some other form of dermatitis. The area on the left dorsal foot has some erythema around it I have marked this area but I am not sure whether this is cellulitis or not. Left heel is not closed. Left calf the reopening is really slightly longer and probably worse 1/13; in general things look better and smaller except for the left dorsal foot. Area on the left heel is just about closed, left buttock looks better only a small wound remains in the skin looks better [using Lotrisone] 1/20; the area on the left heel only has a few remaining open areas here. Left lateral calf about the same in terms of size, left dorsal foot slightly larger right lateral foot still not closed. The area on the left buttock has no open wound and the surrounding skin looks a lot better 1/27; the area on the left heel is closed. Left lateral calf better but still requiring extensive debridements. The area on his left buttock is closed. He still has the open areas on the left dorsal foot which is slightly smaller in the right foot which is slightly expanded. We have been using Iodoflex on these areas as well 2/3; left heel is closed. Left lateral calf still requiring debridement using Iodoflex there is no open area on his left buttock however he has dry scaly skin over a large area of this. Not really responding well to the Lotrisone. Finally the areas on his dorsal feet at the level of the first second webspace are slightly smaller on the right and about the same on the left. Both of these vigorously debrided with Anasept and gauze 2/10; left heel remains closed he has dry  erythematous skin over the left buttock but there is no open wound here. Left lateral leg has come in and with. Still requiring debridement we have been using Iodoflex here. Finally the area on the left dorsal foot and right dorsal foot are really about the same extremely dry callused fissured areas. He does not yet have a dermatology appointment 2/17; left heel remains closed. He has a new open area on the left buttock. The area on the left lateral calf is bigger longer and still covered in necrotic debris. No major change in his foot areas bilaterally. I am awaiting for a dermatologist to look on this. We have been using ketoconazole I  do not know that this is been doing any good at all. 2/24; left heel remains closed. The left buttock wound that was new reopening last week looks better. The left lateral calf appears better also although still requires debridement. The major area on his foot is the left first second also requiring debridement. We have been putting Prisma on all wounds. I do not believe that the ketoconazole has done too much good for his feet. He will use Lotrisone I am going to give him a 2-week course of terbinafine. We still do not have a dermatology appointment 3/2 left heel remains closed however there is skin over bone in this area I pointed this out to him today. The left buttock wound is epithelialized but still does not look completely stable. The area on the left leg required debridement were using silver collagen here. With regards to his feet we changed to Lotrisone last week and silver alginate. 3/9; left heel remains closed. Left buttock remains closed. The area on the right foot is essentially closed. The left foot remains unchanged. Slightly smaller on the left lateral calf. Using silver collagen to both of these areas 3/16-Left heel remains closed. Area on right foot is closed. Left lateral calf above the lateral malleolus open wound requiring debridement with  easy bleeding. Left dorsal wound proximal to first toe also debrided. Left ischial area open new. Patient has been using Prisma with wrapping every 3 days. Dermatology appointment is apparently tomorrow.Patient has completed his terbinafine 2-week course with some apparent improvement according to him, there is still flaking and dry skin in his foot on the left 3/23; area on the right foot is reopened. The area on the left anterior foot is about the same still a very necrotic adherent surface. He still has the area on the left leg and reopening is on the left buttock. He apparently saw dermatology although I do not have a note. According to the patient who is usually fairly well informed they did not have any good ideas. Put him on oral terbinafine which she is been on before. 3/30; using silver collagen to all wounds. Apparently his dermatologist put him on doxycycline and rifampin presumably some culture grew staph. I do not have this result. He remains on terbinafine although I have used terbinafine on him before 4/6; patient has had a fairly substantial reopening on the right foot between the first and second toes. He is finished his terbinafine and I believe is on doxycycline and rifampin still as prescribed by dermatology. We have been using silver collagen to all his wounds although the patient reports that he thinks silver alginate does better on the wounds on his buttock. 4/13; the area on his left lateral calf about the same size but it did not require debridement. ooLeft dorsal foot just proximal to the webspace between the first and second toes is about the same. Still nonviable surface. I note some superficial bronze discoloration of the dorsal part of his foot ooRight dorsal foot just proximal to the first and second toes also looks about the same. I still think there may be the same discoloration I noted above on the left ooLeft buttock wound looks about the same 4/20; left  lateral calf appears to be gradually contracting using silver collagen. ooHe remains on erythromycin empiric [name] for possible erythrasma involving his digital spaces. The left dorsal foot wound is debrided of tightly adherent necrotic debris and really cleans up quite nicely. The right area is worse  with expansion. I did not debride this it is now over the base of the second toe ooThe area on his left buttock is smaller no debridement is required using silver collagen 5/4; left calf continues to make good progress. ooHe arrives with erythema around the wounds on his dorsal foot which even extends to the plantar aspect. Very concerning for coexistent infection. He is finished the erythromycin I gave him for possible erythrasma this does not seem to have helped. ooThe area on the left foot is about the same base of the dorsal toes ooIs area on the buttock looks improved on the left 5/11; left calf and left buttock continued to make good progress. Left foot is about the same to slightly improved. ooMajor problem is on the right foot. He has not had an x-ray. Deep tissue culture I did last week showed both Enterobacter and E. coli. I did not change the doxycycline I put him on empirically although neither 1 of these were plated to doxycycline. He arrives today with the erythema looking worse on both the dorsal and plantar foot. Macerated skin on the bottom of the foot. he has not been systemically unwell 5/18-Patient returns at 1 week, left calf wound appears to be making some progress, left buttock wound appears slightly worse than last time, left foot wound looks slightly better, right foot redness is marginally better. X-ray of both feet show no air or evidence of osteomyelitis. Patient is finished his Omnicef and terbinafine. He continues to have macerated skin on the bottom of the left foot as well as right 5/26; left calf wound is better, left buttock wound appears to have multiple  small superficial open areas with surrounding macerated skin. X-rays that I did last time showed no evidence of osteomyelitis in either foot. He is finished cefdinir and doxycycline. I do not think that he was on terbinafine. He continues to have a large superficial open area on the right foot anterior dorsal and slightly between the first and second toes. I did send him to dermatology 2 months ago or so wondering about whether they would do a fungal scraping. I do not believe they did but did do a culture. We have been using silver alginate to the toe areas, he has been using antifungals at home topically either ketoconazole or Lotrisone. We are using silver collagen on the left foot, silver alginate on the right, silver collagen on the left lateral leg and silver alginate on the left buttock 6/1; left buttock area is healed. We have the left dorsal foot, left lateral leg and right dorsal foot. We are using silver alginate to the areas on both feet and silver collagen to the area on his left lateral calf 6/8; the left buttock apparently reopened late last week. He is not really sure how this happened. He is tolerating the terbinafine. Using silver alginate to all wounds 6/15; left buttock wound is larger than last week but still superficial. ooCame in the clinic today with a report of purulence from the left lateral leg I did not identify any infection ooBoth areas on his dorsal feet appear to be better. He is tolerating the terbinafine. Using silver alginate to all wounds 6/22; left buttock is about the same this week, left calf quite a bit better. His left foot is about the same however he comes in with erythema and warmth in the right forefoot once again. Culture that I gave him in the beginning of May showed Enterobacter and E. coli.  I gave him doxycycline and things seem to improve although neither 1 of these organisms was specifically plated. 6/29; left buttock is larger and dry this  week. Left lateral calf looks to me to be improved. Left dorsal foot also somewhat improved right foot completely unchanged. The erythema on the right foot is still present. He is completing the Ceftin dinner that I gave him empirically [[name] discussion above.) 7/6 - All wounds look to be stable and perhaps improved, the left buttock wound is slightly smaller, per patient bleeds easily, completed ceftin, the right foot redness is less, he is on terbinafine 7/13; left buttock wound about the same perhaps slightly narrower. Area on the left lateral leg continues to narrow. Left dorsal foot slightly smaller right foot about the same. We are using silver alginate on the right foot and Hydrofera Blue to the areas on the left. Unna boot on the left 2 layer compression on the right 7/20; left buttock wound absolutely the same. Area on lateral leg continues to get better. Left dorsal foot require debridement as did the right no major change in the 7/27; left buttock wound the same size necrotic debris over the surface. The area on the lateral leg is closed once again. His left foot looks better right foot about the same although there is some involvement now of the posterior first second toe area. He is still on terbinafine which I have given him for a month, not certain a centimeter major change 06/25/19-All wounds appear to be slightly improved according to report, left buttock wound looks clean, both foot wounds have minimal to no debris the right dorsal foot has minimal slough. We are using Hydrofera Blue to the left and silver alginate to the right foot and ischial wound. 8/10-Wounds all appear to be around the same, the right forefoot distal part has some redness which was not there before, however the wound looks clean and small. Ischial wound looks about the same with no changes 8/17; his wound on the left lateral calf which was his original chronic venous insufficiency wound remains closed. Since I  last saw him the areas on the left dorsal foot right dorsal foot generally appear better but require debridement. The area on his left initial tuberosity appears somewhat larger to me perhaps hyper granulated and bleeds very easily. We have been using Hydrofera Blue to the left dorsal foot and silver alginate to everything else 8/24; left lateral calf remains closed. The areas on his dorsal feet on the webspace of the first and second toes bilaterally both look better. The area on the left buttock which is the pressure ulcer stage II slightly smaller. I change the dressing to Hydrofera Blue to all areas 8/31; left lateral calf remains closed. The area on his dorsal feet bilaterally look better. Using Hydrofera Blue. Still requiring debridement on the left foot. No change in the left buttock pressure ulcers however 9/14; left lateral calf remains closed. Dorsal feet look quite a bit better than 2 [name] ago. Flaking dry skin also a lot better with the ammonium lactate I gave him 2 [name] ago. The area on the left buttock is improved. He states that his Roho cushion developed a leak and he is getting a new one, in the interim he is offloading this vigorously 9/21; left calf remains closed. Left heel which was a possible DTI looks better this week. He had macerated tissue around the left dorsal foot right foot looks satisfactory and improved left buttock wound.  I changed his dressings to his feet to silver alginate bilaterally. Continuing Hydrofera Blue on the left buttock. 9/28 left calf remains closed. Left heel did not develop anything [possible DTI] dry flaking skin on the left dorsal foot. Right foot looks satisfactory. Improved left buttock wound. We are using silver alginate on his feet Hydrofera Blue on the buttock. I have asked him to go back to the Lotrisone on his feet including the wounds and surrounding areas 10/5; left calf remains closed. The areas on the left and right feet about the  same. A lot of this is epithelialized however debris over the remaining open areas. He is using Lotrisone and silver alginate. The area on the left buttock using Hydrofera Blue 10/26. Patient has been out for 3 [name] secondary to Covid concerns. He tested negative but I think his wife tested positive. He comes in today with the left foot substantially worse, right foot about the same. Even more concerning he states that the area on his left buttock closed over but then reopened and is considerably deeper in one aspect than it was before [stage III wound] 11/2; left foot really about the same as last week. Quarter sized wound on the dorsal foot just proximal to the first second toes. Surrounding erythema with areas of denuded epithelium. This is not really much different looking. Did not look like cellulitis this time however. ooRight foot area about the same.. We have been using silver alginate alginate on his toes ooLeft buttock still substantial irritated skin around the wound which I think looks somewhat better. We have been using Hydrofera Blue here. 11/9; left foot larger than last week and a very necrotic surface. Right foot I think is about the same perhaps slightly smaller. Debris around the circumference also addressed. Unfortunately on the left buttock there is been a decline. Satellite lesions below the major wound distally and now a an additional one posteriorly we have been using Hydrofera Blue but I think this is a pressure issue 11/16; left foot ulcer dorsally again a very adherent necrotic surface. Right foot is about the same. Not much change in the pressure ulcer on his left buttock. 11/30; left foot ulcer dorsally basically the same as when I saw him 2 [name] ago. Very adherent fibrinous debris on the wound surface. Patient reports a lot of drainage as well. 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 We have been using Iodoflex,  patient changed back to alginate because of the drainage. Area on his right dorsal foot really looks benign with a healthier surface certainly a lot better than on the left. Left buttock wounds all improved using Hydrofera Blue 12/7; left dorsal foot again no improvement. Tightly adherent debris. PCR culture I did last week only showed likely skin contaminant. I have gone ahead and done a punch biopsy of this which is about the last thing in terms of investigations I can think to do. He has known venous insufficiency and venous hypertension and this could be the issue here. The area on the right foot is about the same left buttock slightly worse according to our intake nurse secondary to [facility] Blue sticking to the wound 12/14; biopsy of the left foot that I did last time showed changes that could be related to wound healing/chronic stasis dermatitis phenomenon no neoplasm. We have been using silver alginate to both feet. I change the one on the left today to Sorbact and silver alginate to his  other 2 wounds 12/28; the patient arrives with the following problems; ooMajor issue is the dorsal left foot which continues to be a larger deeper wound area. Still with a completely nonviable surface ooParadoxically the area mirror image on the right on the right dorsal foot appears to be getting better. ooHe had some loss of dry denuded skin from the lower part of his original wound on the left lateral calf. Some of this area looked a little vulnerable and for this reason we put him in wrap that on this side this week ooThe area on his left buttock is larger. He still has the erythematous circular area which I think is a combination of pressure, sweat. This does not look like cellulitis or fungal dermatitis 11/26/2019; -Dorsal left foot large open wound with depth. Still debris over the surface. Using Sorbact ooThe area on the dorsal right foot paradoxically has closed over [facility] has a reopening  on the left ankle laterally at the base of his original wound that extended up into the calf. This appears clean. ooThe left buttock wound is smaller but with very adherent necrotic debris over the surface. We have been using silver alginate here as well The patient had arterial studies done in 2017. He had biphasic waveforms at the dorsalis pedis and posterior tibial bilaterally. ABI in the left was 1.17. Digit waveforms were dampened. He has slight spasticity in the great toes I do not think a TBI would be possible 1/11; the patient comes in today with a sizable reopening between the first and second toes on the right. This is not exactly in the same location where we have been treating wounds previously. According to our intake nurse this was actually fairly deep but 0.6 cm. The area on the left dorsal foot looks about the same the surface is somewhat cleaner using Sorbact, his MRI is in 2 days. We have not managed yet to get arterial studies. The new reopening on the left lateral calf looks somewhat better using alginate. The left buttock wound is about the same using alginate 1/18; the patient had his ARTERIAL studies which were quite normal. ABI in the right at 1.13 with triphasic/biphasic waveforms on the left ABI 1.06 again with triphasic/biphasic waveforms. It would not have been possible to have done a toe brachial index because of spasticity. We have been using Sorbac to the left foot alginate to the rest of his wounds on the right foot left lateral calf and left buttock 1/25; arrives in clinic with erythema and swelling of the left forefoot worse over the first MTP area. This extends laterally dorsally and but also posteriorly. Still has an area on the left lateral part of the lower part of his calf wound it is eschared and clearly not closed. ooArea on the left buttock still with surrounding irritation and erythema. ooRight foot surface wound dorsally. The area between the right  and first and second toes appears better. 2/1; ooThe left foot wound is about the same. Erythema slightly better I gave him a week of doxycycline empirically ooRight foot wound is more extensive extending between the toes to the plantar surface ooLeft lateral calf really no open surface on the inferior part of his original wound however the entire area still looks vulnerable ooAbsolutely no improvement in the left buttock wound required debridement. 2/8; the left foot is about the same. Erythema is slightly improved I gave him clindamycin last week. ooRight foot looks better he is using Lotrimin and silver alginate   ooHe has a breakdown in the left lateral calf. Denuded epithelium which I have removed ooLeft buttock about the same were using Hydrofera Blue 2/15; left foot is about the same there is less surrounding erythema. Surface still has tightly adherent debris which I have debriding however not making any progress ooRight foot has a substantial wound on the medial right second toe between the first and second webspace. ooStill an open area on the left lateral calf distal area. ooButtock wound is about the same 2/22; left foot is about the same less surrounding erythema. Surface has adherent debris. Polymen Ag Right foot area significant wound between the first and second toes. We have been using silver alginate here Left lateral leg polymen Ag at the base of his original venous insufficiency wound ooLeft buttock some improvement here 3/1; ooRight foot is deteriorating in the first second toe webspace. Larger and more substantial. We have been using silver alginate. ooLeft dorsal foot about the same markedly adherent surface debris using PolyMem Ag ooLeft lateral calf surface debris using PolyMem AG ooLeft buttock is improved again using PolyMem Ag. ooHe is completing his terbinafine. The erythema in the foot seems better. He has been on this for 2 [name] 3/8; no  improvement in any wound area in fact he has a small open area on the dorsal midfoot which is new this week. He has not gotten his foot x-rays yet 3/15; his x-rays were both negative for osteomyelitis of both feet. No major change in any of his wounds on the extremities however his buttock wounds are better. We have been using polymen on the buttocks, left lower leg. Iodoflex on the left foot and silver alginate on the right 3/22; arrives in clinic today with the 2 major issues are the improvement in the left dorsal foot wound which for once actually looks healthy with a nice healthy wound surface without debridement. Using Iodoflex here. Unfortunately on the left lateral calf which is in the distal part of his original wound he came to the clinic here for there was purulent drainage noted some increased breakdown scattered around the original area and a small area proximally. We we are using polymen here will change to silver alginate today. His buttock wound on the left is better and I think the area on the right first second toe webspace is also improved 3/29; left dorsal foot looks better. Using Iodoflex. Left ankle culture from deterioration last time grew E. coli, Enterobacter and Enterococcus. I will give him a course of cefdinir although that will not cover Enterococcus. The area on the right foot in the webspace of the first and second toe lateral first toe looks better. The area on his buttock is about healed Vascular appointment is on April 21. This is to look at his venous system vis--vis continued breakdown of the wounds on the left including the left lateral leg and left dorsal foot he. He has had previous ablations on this side 4/5; the area between the right first and second toes lateral aspect of the first toe looks better. Dorsal aspect of the left first toe on the left foot also improved. Unfortunately the left lateral lower leg is larger and there is a second satellite wound  superiorly. The usual superficial abrasions on the left buttock overall better but certainly not closed 4/12; the area between the right first and second toes is improved. Dorsal aspect of the left foot also slightly smaller with a vibrant healthy looking surface. No real change  in the left lateral leg and the left buttock wound is healed He has an unaffordable co-pay for Apligraf. Appointment with vein and vascular with regards to the left leg venous part of the circulation is on 4/21 4/19; we continue to [name] improvement in all wound areas. Although this is minor. He has his vascular appointment on 4/21. The area on the left buttock has not reopened although right in the center of this area the skin looks somewhat threatened Objective Constitutional Sitting or standing Blood Pressure is within target range for patient.. Pulse regular and within target range for patient.Marland Kitchen Respirations regular, non-labored and within target range.. Temperature is normal and within the target range for the patient.Marland Kitchen Appears in no distress. Vitals Time Taken: 7:50 AM, Height: 70 in, Weight: 216 lbs, BMI: 31, Temperature: 97.7 F, Pulse: 108 bpm, Respiratory Rate: 20 breaths/min, Blood Pressure: 128/65 mmHg. General Notes: Wound exam; the areas on the left foot appears to have healthy granulation. Slightly smaller. There is rims of epithelialization. Some surface slough removed with a #3 curette bleeding controlled with direct pressure ooThe wound area between his right first and second toes slightly smaller. This is right at the base and with his rigidity is not an area I can debride ooLeft lateral leg under illumination a lot of material that requires debridement probably surface slough #3 direct pressure again. Integumentary (Hair, Skin) Wound #24 status is Open. Original cause of wound was Gradually Appeared. The wound is located on the Left,Dorsal Foot. The wound measures 1.8cm length x 1.8cm width x 0.3cm  depth; 2.545cm^2 area and 0.763cm^3 volume. There is Fat Layer (Subcutaneous Tissue) Exposed exposed. There is no tunneling or undermining noted. There is a medium amount of serosanguineous drainage noted. The wound margin is flat and intact. There is large (67-100%) red granulation within the wound bed. There is a small (1-33%) amount of necrotic tissue within the wound bed including Adherent Slough. Wound #37 status is Open. Original cause of wound was Gradually Appeared. The wound is located on the Left,Lateral Malleolus. The wound measures 3.5cm length x 1.3cm width x 0.1cm depth; 3.574cm^2 area and 0.357cm^3 volume. There is Fat Layer (Subcutaneous Tissue) Exposed exposed. There is no tunneling or undermining noted. There is a medium amount of serosanguineous drainage noted. The wound margin is flat and intact. There is large (67-100%) red granulation within the wound bed. There is no necrotic tissue within the wound bed. Wound #38 status is Open. Original cause of wound was Gradually Appeared. The wound is located on the Right Toe - Web between 1st and 2nd. The wound measures 0.6cm length x 0.5cm width x 0.1cm depth; 0.236cm^2 area and 0.024cm^3 volume. There is Fat Layer (Subcutaneous Tissue) Exposed exposed. There is no tunneling or undermining noted. There is a small amount of serosanguineous drainage noted. The wound margin is flat and intact. There is large (67-100%) red granulation within the wound bed. There is no necrotic tissue within the wound bed. Wound #40 status is Open. Original cause of wound was Gradually Appeared. The wound is located on the Left,Lateral Lower Leg. The wound measures 0.4cm length x 0.3cm width x 0.1cm depth; 0.094cm^2 area and 0.009cm^3 volume. There is Fat Layer (Subcutaneous Tissue) Exposed exposed. There is no tunneling or undermining noted. There is a small amount of serosanguineous drainage noted. The wound margin is flat and intact. There is large  (67-100%) pink granulation within the wound bed. There is no necrotic tissue within the wound bed. 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 There was a Excisional Skin/Subcutaneous Tissue Debridement with a total area of 3.24 sq cm performed by [name] Dillon., MD. With the following instrument(s): Curette to remove Viable and Non-Viable tissue/material. Material removed includes Subcutaneous Tissue and Slough and. No specimens were taken. A time out was conducted at 08:10, prior to the start of the procedure. A Minimum amount of bleeding was controlled with Pressure. The procedure was tolerated well with a pain level of 0 throughout and a pain level of 0 following the procedure. Post Debridement Measurements: 1.8cm length x 1.8cm width x 0.3cm depth; 0.763cm^3 volume. Character of Wound/Ulcer Post Debridement is improved. 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 There was a Excisional Skin/Subcutaneous Tissue Debridement with a total area of 4.55 sq cm performed by [name] Dillon., MD. With the following instrument(s): Curette to remove Viable and Non-Viable tissue/material. Material removed includes Subcutaneous Tissue and Slough and. No specimens were taken. A time out was conducted at 08:10, prior to the start of  the procedure. A Minimum amount of bleeding was controlled with Pressure. The procedure was tolerated well with a pain level of 0 throughout and a pain level of 0 following the procedure. Post Debridement Measurements: 3.5cm length x 1.3cm width x 0.1cm depth; 0.357cm^3 volume. Character of Wound/Ulcer Post Debridement is improved. Severity of Tissue Post Debridement is: Fat layer exposed. 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 There was a Four Layer Compression Therapy Procedure by [name] Hurst, RN. Post procedure Diagnosis Wound #37: Same as Pre-Procedure Wound #40 Pre-procedure diagnosis of Wound #40 is a Venous Leg Ulcer located on the Left,Lateral Lower Leg . There was a Four Layer Compression Therapy Procedure by [name] Hurst, RN. Post procedure Diagnosis Wound #40: Same as Pre-Procedure Plan Follow-up Appointments: Return Appointment in 1 week. Dressing Change Frequency: Wound #37 Left,Lateral Malleolus: Do not change entire dressing for one week. Wound #38 Right Toe - Web between 1st and 2nd: Change Dressing every other day. Wound #40 Left,Lateral Lower Leg: Do not change entire dressing for one week. Skin Barriers/Peri-Wound Care: Antifungal cream - on toes on both feet daily Barrier cream - to leg/ankle and left foot Moisturizing lotion - both legs Other: - Triamcinolone cream Primary Wound Dressing: Wound #37 Left,Lateral Malleolus: Calcium Alginate with Silver Wound #38 Right Toe - Web between 1st and 2nd: Calcium Alginate with Silver Wound #40 Left,Lateral Lower Leg: Calcium Alginate with Silver Wound #24 Left,Dorsal Foot: Iodoflex Secondary Dressing: Wound #24 Left,Dorsal Foot: Dry Gauze ABD pad Wound #37 Left,Lateral Malleolus: Dry Gauze ABD pad Wound #38 Right Toe - Web between 1st and 2nd: Kerlix/Rolled Gauze - secure with tape Dry  Gauze Wound #40 Left,Lateral Lower Leg: Dry Gauze ABD pad Edema Control: 4 layer compression: Left lower extremity Elevate legs to the level of  the heart or above for 30 minutes daily and/or when sitting, a frequency of: - throughout the day Support Garment 30-40 mm/Hg pressure to: - Juxtalite to right leg Off-Loading: Low air-loss mattress (Group 2) Roho cushion for wheelchair Turn and reposition every 2 hours - out of wheelchair throughout the day, try to lay on sides, sleep in the bed not the recliner 1. I did not change the primary dressings. Iodoflex to the left foot silver alginate to the other 2 wound areas 2. Debridement as noted 3. I think that we need to continue to put something on the left buttock where it is going to continually breakdown. The skin is consistently excoriated which I think is a combination of moisture and pressure Electronic Signature(s) Signed: 03/10/2020 5:34:04 PM By: [name] Ham MD Entered By: [name] on 03/10/2020 08:48:13 -------------------------------------------------------------------------------- SuperBill Details Patient Name: Date of Service: [name], [name] 03/10/2020 Medical Record [id number] Patient Account Number: [id number] Date of Birth/Sex: Treating RN: [birth year] (34 y.o. [name] Primary Care Provider: [place], [place] Other Clinician: Referring Provider: Treating Provider/Extender:Armel Rabbani, [name], [name]: [phone] Diagnosis Coding ICD-10 Codes Code Description I87.332 Chronic venous hypertension (idiopathic) with ulcer and inflammation of left lower extremity L97.321 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 to breakdown of skin L97.521 Non-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 L97.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, 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,  complete Facility Procedures CPT4 Code Description: [id number] - DEB SUBQ TISSUE 20 SQ CM/< ICD-10 Diagnosis Description L97.321 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 to breakdow L97.521 Non-pressure chronic ulcer of other part of left foot limite Modifier: n of skin d to breakdown Quantity: 1 of skin Physician Procedures Electronic Signature(s) Signed: 03/10/2020 5:34:04 PM By: [name] Ham MD Entered By: [name] on 03/10/2020 08:49:01

## 2020-03-11 NOTE — Progress Notes (Addendum)
Budhu, ACETON MACEDA ([id number]) Visit Report for 03/10/2020 Arrival Information Details Patient Name: Date of Service: CASMER, LANE 03/10/2020 7:30 AM Medical Record [id number] Patient Account Number: [id number] Date of Birth/Sex: Treating RN: [birth year] (34 y.o. [name] Primary Care Chandrika Sandles: [name], [name] Other Clinician: Referring Amory Zbikowski: Treating Ellie Spickler/Extender:Robson, [name], [name]: 218 Visit Information History Since Last Visit Added or deleted any medications: No Patient Arrived: Wheel Chair Any new allergies or adverse reactions: No Arrival Time: 07:47 Had a fall or experienced change in No activities of daily living that may affect Accompanied By: self risk of falls: Transfer Assistance: None Signs or symptoms of abuse/neglect since last No Patient Identification Verified: Yes visito Secondary Verification Process Completed: Yes Hospitalized since last visit: No Patient Requires Transmission-Based No Implantable device outside of the clinic excluding No Precautions: cellular tissue based products placed in the center Patient Has Alerts: Yes since last visit: Patient Alerts: R ABI = Has Dressing in Place as Prescribed: Yes 1.0 Pain Present Now: No L ABI = 1.1 Electronic Signature(s) Signed: 03/11/2020 1:29:28 PM By: Sandre Kitty Entered By: Sandre Kitty on 03/10/2020 07:49:02 -------------------------------------------------------------------------------- Compression Therapy Details Patient Name: Date of Service: Starn, [name] 03/10/2020 7:30 AM Medical Record [id number] Patient Account Number: [id number] Date of Birth/Sex: Treating RN: [birth year] (34 y.o. [name] Primary Care Yumiko Alkins: [place], [place] Other Clinician: Referring Miriya Cloer: Treating Megan Presti/Extender:Robson, [name], [name]: 218 Compression Therapy Performed for Wound Wound #37 Left,Lateral  Malleolus Assessment: Performed By: Clinician Levan Hurst, RN Compression Type: Four Layer Post Procedure Diagnosis Same as Pre-procedure Electronic Signature(s) Signed: 03/10/2020 6:02:31 PM By: Levan Hurst RN, BSN Entered By: Levan Hurst on 03/10/2020 08:13:10 -------------------------------------------------------------------------------- Compression Therapy Details Patient Name: Date of Service: [place]rup, [name] 03/10/2020 7:30 AM Medical Record [id number] Patient Account Number: [id number] Date of Birth/Sex: Treating RN: Jul 02, 1986 (34 y.o. [name] Primary Care Virgil Lightner: [name], [name] Other Clinician: Referring Rechel Delosreyes: Treating Rashun Grattan/Extender:Robson, [name], [name]: 218 Compression Therapy Performed for Wound Wound #40 Left,Lateral Lower Leg Assessment: Performed By: Clinician Levan Hurst, RN Compression Type: Four Layer Post Procedure Diagnosis Same as Pre-procedure Electronic Signature(s) Signed: 03/10/2020 6:02:31 PM By: Levan Hurst RN, BSN Entered By: Levan Hurst on 03/10/2020 08:13:10 -------------------------------------------------------------------------------- Encounter Discharge Information Details Patient Name: Date of Service: Lumbert, [name] 03/10/2020 7:30 AM Medical Record [id number] Patient Account Number: [id number] Date of Birth/Sex: Treating RN: 1986/11/06 (34 y.o. [name] Primary Care Brissa Asante: [place], [place] Other Clinician: Referring Venkat Ankney: Treating Cassandria Drew/Extender:Robson, [name], [name]: 218 Encounter Discharge Information Items Post Procedure Vitals Discharge Condition: Stable Temperature (F): 97.7 Ambulatory Status: Wheelchair Pulse (bpm): 108 Discharge Destination: Home Respiratory Rate (breaths/min): 20 Transportation: Other Blood Pressure (mmHg): 128/65 Accompanied By: self Schedule Follow-up Appointment: Yes Clinical Summary  of Care: Patient Declined Electronic Signature(s) Signed: 03/10/2020 5:36:36 PM By: Kela Millin Entered By: Kela Millin on 03/10/2020 08:21:52 -------------------------------------------------------------------------------- Lower Extremity Assessment Details Patient Name: Date of Service: Jobson, [name] 03/10/2020 7:30 AM Medical Record [id number] Patient Account Number: [id number] Date of Birth/Sex: Treating RN: January 25, 1986 (34 y.o. [name] Primary Care Fe Okubo: [place], [place] Other Clinician: Referring Paislei Dorval: Treating Lita Flynn/Extender:Robson, [name], [name]: 218 Edema Assessment Assessed: [Left: Yes] [Right: Yes] Edema: [Left: No] [Right: Yes] Calf Left: Right: Point of Measurement: 33 cm From Medial Instep 25.5 cm 32.5 cm Ankle Left: Right: Point of Measurement: 10 cm From Medial Instep 23 cm 23.5 cm Electronic Signature(s) Signed: 03/10/2020  6:02:31 PM By: Levan Hurst RN, BSN Signed: 03/11/2020 1:29:28 PM By: Sandre Kitty Entered By: Sandre Kitty on 03/10/2020 07:56:08 -------------------------------------------------------------------------------- Multi Wound Chart Details Patient Name: Date of Service: Cherubini, [name] 03/10/2020 7:30 AM Medical Record [id number] Patient Account Number: [id number] Date of Birth/Sex: Treating RN: 01/04/1986 (34 y.o. [name] Primary Care Kristina Mcnorton: [name], [name] Other Clinician: Referring Lynelle Weiler: Treating Dayson Aboud/Extender:Robson, [name], [name]: 218 Vital Signs Height(in): 70 Pulse(bpm): 108 Weight(lbs): 216 Blood Pressure(mmHg): 128/65 Body Mass Index(BMI): 31 Temperature(F): 97.7 Respiratory 20 Rate(breaths/min): Photos: [24:No Photos] [37:No Photos] [38:No Photos] Wound Location: [24:Left, Dorsal Foot] [37:Left, Lateral Malleolus] [38:Right Toe - Web between 1st and 2nd] Wounding Event: [24:Gradually Appeared]  [37:Gradually Appeared] [38:Gradually Appeared] Primary Etiology: [24:Inflammatory] [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] [38:Inflammatory] Secondary Etiology: [24:N/A] [37:N/A] [38:N/A] Comorbid History: [24:Sleep Apnea, Hypertension, Sleep Apnea, Hypertension, Sleep Apnea, Hypertension, Paraplegia] [37:Paraplegia] [38:Paraplegia] Date Acquired: [24:03/08/2018] [37:11/26/2019] [38:11/30/2019] Weeks of Treatment: [id number] [37:15] [38:14] Wound Status: [24:Open] [37:Open] [38:Open] Clustered Wound: [24:Yes] [37:No] [38:No] Clustered Quantity: [24:1] [37:N/A] [38:N/A] Measurements L x W x D 1.8x1.8x0.3 [37:3.5x1.3x0.1] [38:0.6x0.5x0.1] (cm) Area (cm) : [24:2.545] [37:3.574] [38:0.236] Volume (cm) : [24:0.763] [37:0.357] [38:0.024] % Reduction in Area: [24:-978.40%] [37:9.70%] [38:28.50%] % Reduction in Volume: -3079.20% [37:9.80%] [38:89.60%] Classification: [24:Full Thickness Without Exposed Support Structures Exposed Support Structures Exposed Support Structures] [37:Full Thickness Without] [38:Full Thickness Without] Exudate Amount: [24:Medium] [37:Medium] [38:Small] Exudate Type: [24:Serosanguineous] [37:Serosanguineous] [38:Serosanguineous] Exudate Color: [24:red, brown] [37:red, brown] [38:red, brown] Wound Margin: [24:Flat and Intact] [37:Flat and Intact] [38:Flat and Intact] Granulation Amount: [24:Large (67-100%)] [37:Large (67-100%)] [38:Large (67-100%)] Granulation Quality: [24:Red] [37:Red] [38:Red] Necrotic Amount: [24:Small (1-33%)] [37:None Present (0%)] [38:None Present (0%)] Exposed Structures: [24:Fat Layer (Subcutaneous Fat Layer (Subcutaneous Fat Layer (Subcutaneous Tissue) Exposed: Yes Fascia: No Tendon: No Muscle: No Joint: No Bone: No] [37:Tissue) Exposed: Yes Fascia: No Tendon: No Muscle: No Joint: No Bone: No] [38:Tissue) Exposed: Yes  Fascia: No Tendon: No Muscle: No Joint: No Bone: No] Epithelialization: [24:Small (1-33%)] [37:Small (1-33%)] [38:Medium (34-66%)] Debridement:  [24:Debridement - Excisional Debridement - Excisional N/A] Pre-procedure [24:08:10] [37:08:10] [38:N/A] Verification/Time Out Taken: Tissue Debrided: [24:Subcutaneous, Slough] [37:Subcutaneous, Slough] [38:N/A] Level: [24:Skin/Subcutaneous Tissue Skin/Subcutaneous Tissue N/A] Debridement Area (sq cm):3.24 [37:4.55] [38:N/A] Instrument: [24:Curette] [37:Curette] [38:N/A] Bleeding: [24:Minimum] [37:Minimum] [38:N/A] Hemostasis Achieved: [24:Pressure] [37:Pressure] [38:N/A] Procedural Pain: [24:0] [37:0] [38:N/A] Post Procedural Pain: [24:0] [37:0] [38:N/A] Debridement Treatment Procedure was tolerated [37:Procedure was tolerated] [38:N/A] Response: [24:well] [37:well] Post Debridement [24:1.8x1.8x0.3] [37:3.5x1.3x0.1] [38:N/A] Measurements L x W x D (cm) Post Debridement [24:0.763] [37:0.357] [38:N/A] Volume: (cm) Procedures Performed: [24:Debridement 40] [37:Compression Therapy Debridement N/A] [38:N/A N/A] Photos: [24:No Photos] [37:N/A] [38:N/A] Wound Location: [24:Left, Lateral Lower Leg] [37:N/A] [38:N/A] Wounding Event: [24:Gradually Appeared] [37:N/A] [38:N/A] Primary Etiology: [24:Venous Leg Ulcer] [37:N/A] [38:N/A] Secondary Etiology: [24:Lymphedema] [37:N/A] [38:N/A] Comorbid History: [24:Sleep Apnea, Hypertension, N/A Paraplegia] [38:N/A] Date Acquired: [24:02/11/2020] [37:N/A] [38:N/A] Weeks of Treatment: [24:4] [37:N/A] [38:N/A] Wound Status: [24:Open] [37:N/A] [38:N/A] Clustered Wound: [24:No] [37:N/A] [38:N/A] Clustered Quantity: [24:N/A] [37:N/A] [38:N/A] Measurements L x W x D 0.4x0.3x0.1 [37:N/A] [38:N/A] (cm) Area (cm) : [24:0.094] [37:N/A] [38:N/A] Volume (cm) : [24:0.009] [37:N/A] [38:N/A] % Reduction in Area: [24:76.10%] [37:N/A] [38:N/A] % Reduction in Volume: 76.90% [37:N/A] [38:N/A] Classification: [24:Full Thickness Without Exposed Support Structures] [37:N/A] [38:N/A] Exudate Amount: [24:Small] [37:N/A] [38:N/A] Exudate Type: [24:Serosanguineous]  [37:N/A] [38:N/A] Exudate Color: [24:red, brown] [37:N/A] [38:N/A] Wound Margin: [24:Flat and Intact] [37:N/A] [38:N/A] Granulation Amount: [24:Large (67-100%)] [37:N/A] [38:N/A] Granulation Quality: [24:Pink] [37:N/A] [38:N/A] Necrotic Amount: [24:None Present (0%)] [37:N/A] [38:N/A] Exposed Structures: [24:Fat Layer (Subcutaneous N/A  Tissue) Exposed: Yes Fascia: No Tendon: No Muscle: No Joint: No Bone: No] [38:N/A] Epithelialization: [24:Medium (34-66%)] [37:N/A] [38:N/A] Debridement: [24:N/A] [37:N/A] [38:N/A] Tissue Debrided: [24:N/A] [37:N/A] [38:N/A] Level: [24:N/A] [37:N/A] [38:N/A] Debridement Area (sq cm):N/A [37:N/A] [38:N/A] Instrument: [24:N/A] [37:N/A] [38:N/A] Bleeding: [24:N/A] [37:N/A] [38:N/A] Hemostasis Achieved: [24:N/A] [37:N/A] [38:N/A] Procedural Pain: [24:N/A] [37:N/A] [38:N/A] Post Procedural Pain: [24:N/A] [37:N/A] [38:N/A] Debridement Treatment N/A [37:N/A] [38:N/A] Response: Post Debridement [24:N/A] [37:N/A] [38:N/A] Measurements L x W x D (cm) Post Debridement [24:N/A] [37:N/A] [38:N/A] Volume: (cm) Procedures Performed: Compression Therapy [37:N/A] [38:N/A] Treatment Notes Wound #24 (Left, Dorsal Foot) 1. Cleanse With Wound Cleanser Soap and water 2. Periwound Care Antifungal cream TCA Cream 3. Primary Dressing Applied Calcium Alginate Ag Iodoflex 4. Secondary Dressing ABD Pad Dry Gauze 6. Support Layer Applied 4 layer compression wrap Wound #37 (Left, Lateral Malleolus) 1. Cleanse With Wound Cleanser Soap and water 2. Periwound Care Antifungal cream TCA Cream 3. Primary Dressing Applied Calcium Alginate Ag Iodoflex 4. Secondary Dressing ABD Pad Dry Gauze 6. Support Layer Applied 4 layer compression wrap Wound #38 (Right Toe - Web between 1st and 2nd) 1. Cleanse With Wound Cleanser 2. Periwound Care Antifungal cream 3. Primary Dressing Applied Calcium Alginate Ag 4. Secondary Dressing ABD Pad Dry Gauze Roll Gauze 5.  Secured With Tape Notes juxtalite Wound #40 (Left, Lateral Lower Leg) 1. Cleanse With Wound Cleanser Soap and water 2. Periwound Care Antifungal cream TCA Cream 3. Primary Dressing Applied Calcium Alginate Ag Iodoflex 4. Secondary Dressing ABD Pad Dry Gauze 6. Support Layer Applied 4 layer compression Water quality scientist) Signed: 03/10/2020 5:34:04 PM By: Linton Ham MD Signed: 03/10/2020 6:02:31 PM By: Levan Hurst RN, BSN Entered By: Linton Ham on 03/10/2020 08:44:08 -------------------------------------------------------------------------------- Multi-Disciplinary Care Plan Details Patient Name: Date of Service: CRIBBRonnie, Buntin 03/10/2020 7:30 AM Medical Record [id number] Patient Account Number: [id number] Date of Birth/Sex: Treating RN: 10/21/1986 (34 y.o. [name] Primary Care Sakari Raisanen: [name], [name] Other Clinician: Referring Evart Mcdonnell: Treating Mordecai Tindol/Extender:Robson, [name], [name]: 218 Active Inactive Wound/Skin Impairment Nursing Diagnoses: Impaired tissue integrity Knowledge deficit related to ulceration/compromised skin integrity Goals: Patient/caregiver will verbalize understanding of skin care regimen Date Initiated: 01/05/2016 Target Resolution Date: 04/11/2020 Goal Status: Active Ulcer/skin breakdown will have a volume reduction of 30% by week 4 Date Initiated: 01/05/2016 Date Inactivated: 12/22/2017 Target Resolution Date: 01/19/2018 Unmet Reason: complex Goal Status: Unmet wounds, infection Interventions: Assess patient/caregiver ability to obtain necessary supplies Assess ulceration(s) every visit Provide education on ulcer and skin care Notes: Electronic Signature(s) Signed: 03/10/2020 6:02:31 PM By: Levan Hurst RN, BSN Entered By: Levan Hurst on 03/10/2020 07:59:13 -------------------------------------------------------------------------------- Pain Assessment Details Patient  Name: Date of Service: Hamad, [name] 03/10/2020 7:30 AM Medical Record [id number] Patient Account Number: [id number] Date of Birth/Sex: Treating RN: 05-27-1986 (34 y.o. [name] Primary Care Yolonda Purtle: [place], [place] Other Clinician: Referring Evana Runnels: Treating Namari Breton/Extender:Robson, [name], [name]: 218 Active Problems Location of Pain Severity and Description of Pain Patient Has Paino No Site Locations Pain Management and Medication Current Pain Management: Electronic Signature(s) Signed: 03/10/2020 6:02:31 PM By: Levan Hurst RN, BSN Signed: 03/11/2020 1:29:28 PM By: Sandre Kitty Entered By: Sandre Kitty on 03/10/2020 07:50:57 -------------------------------------------------------------------------------- Patient/Caregiver Education Details Patient Name: Date of Service: [name] 4/19/2021andnbsp7:30 AM Medical Record [id number] Patient Account Number: [id number] Date of Birth/Gender: 13-Oct-1986 (33 y.o. M) Treating RN: Levan Hurst Primary Care Physician: Janine Limbo Other Clinician: Referring Physician: Treating Physician/Extender:Robson, [name], [name]: [phone] Education  Assessment Education Provided To: Patient Education Topics Provided Wound/Skin Impairment: Methods: Explain/Verbal Responses: State content correctly Electronic Signature(s) Signed: 03/10/2020 6:02:31 PM By: Levan Hurst RN, BSN Entered By: Levan Hurst on 03/10/2020 07:59:27 -------------------------------------------------------------------------------- Wound Assessment Details Patient Name: Date of Service: Cavallero, [name] 03/10/2020 7:30 AM Medical Record [id number] Patient Account Number: [id number] Date of Birth/Sex: Treating RN: October 04, 1986 (34 y.o. [name] Primary Care Aubry Tucholski: [name], [name] Other Clinician: Referring Chantay Whitelock: Treating Chanse Kagel/Extender:Robson, [name],  [name]: 218 Wound Status Wound Number: 24 Primary Etiology: Inflammatory Wound Location: Left, Dorsal Foot Wound Status: Open Wounding Event: Gradually Appeared Comorbid Sleep Apnea, Hypertension, History: Paraplegia Date Acquired: 03/08/2018 Weeks Of Treatment: 104 Clustered Wound: Yes Photos Wound Measurements Length: (cm) 1.8 % Reduct Width: (cm) 1.8 % Reduct Depth: (cm) 0.3 Epitheli Clustered Quantity: 1 Tunnelin Area: (cm) 2.545 Undermi Volume: (cm) 0.763 Wound Description Full Thickness Without Exposed Support Foul Od Classification: Structures Slough/ Wound Wound Flat and Intact Margin: Exudate Medium Amount: Exudate Serosanguineous Type: Exudate red, brown Color: Wound Bed Granulation Amount: Large (67-100%) Granulation Quality: Red Fascia E Necrotic Amount: Small (1-33%) Fat Laye Necrotic Quality: Adherent Slough Tendon E Muscle E Joint Ex Bone Exp or After Cleansing: No Fibrino Yes Exposed Structure xposed: No r (Subcutaneous Tissue) Exposed: Yes xposed: No xposed: No posed: No osed: No ion in Area: -978.4% ion in Volume: -3079.2% alization: Small (1-33%) g: No ning: No Treatment Notes Wound #24 (Left, Dorsal Foot) 1. Cleanse With Wound Cleanser Soap and water 2. Periwound Care Antifungal cream TCA Cream 3. Primary Dressing Applied Calcium Alginate Ag Iodoflex 4. Secondary Dressing ABD Pad Dry Gauze 6. Support Layer Applied 4 layer compression wrap Electronic Signature(s) Signed: 03/12/2020 11:18:43 AM By: Levan Hurst RN, BSN Signed: 03/12/2020 12:23:53 PM By: Mikeal Hawthorne EMT/HBOT Previous Signature: 03/10/2020 6:02:31 PM Version By: Levan Hurst RN, BSN Previous Signature: 03/11/2020 1:29:28 PM Version By: Sandre Kitty Entered By: Mikeal Hawthorne on 03/12/2020 09:49:46 -------------------------------------------------------------------------------- Wound Assessment Details Patient Name: Date of  Service: Hanselman, Kahmari E. 03/10/2020 7:30 AM Medical Record [id number] Patient Account Number: [id number] Date of Birth/Sex: Treating RN: 18-Oct-1986 (34 y.o. [name] Primary Care Latonda Larrivee: [place], [place] Other Clinician: Referring Jaelynne Hockley: Treating Katielynn Horan/Extender:Robson, [name], [name]: 218 Wound Status Wound Number: 37 Primary Etiology: Venous Leg Ulcer Wound Location: Left, Lateral Malleolus Wound Status: Open Wounding Event: Gradually Appeared Comorbid Sleep Apnea, Hypertension, History: Paraplegia Date Acquired: 11/26/2019 Weeks Of Treatment: 15 Clustered Wound: No Photos Wound Measurements Length: (cm) 3.5 % Reduct Width: (cm) 1.3 % Reduct Depth: (cm) 0.1 Epitheli Area: (cm) 3.574 Tunneli Volume: (cm) 0.357 Undermi Wound Description Classification: Full Thickness Without Exposed Support Foul Odo Structures Slough/F Wound Flat and Intact Margin: Exudate Medium Amount: Exudate Serosanguineous Type: Exudate red, brown Color: Wound Bed Granulation Amount: Large (67-100%) Granulation Quality: Red Fascia E Necrotic Amount: None Present (0%) Fat Laye Tendon E Muscle E Joint Ex Bone Exp r After Cleansing: No ibrino No Exposed Structure xposed: No r (Subcutaneous Tissue) Exposed: Yes xposed: No xposed: No posed: No osed: No ion in Area: 9.7% ion in Volume: 9.8% alization: Small (1-33%) ng: No ning: No Treatment Notes Wound #37 (Left, Lateral Malleolus) 1. Cleanse With Wound Cleanser Soap and water 2. Periwound Care Antifungal cream TCA Cream 3. Primary Dressing Applied Calcium Alginate Ag Iodoflex 4. Secondary Dressing ABD Pad Dry Gauze 6. Support Layer Applied 4 layer compression Water quality scientist) Signed: 03/12/2020 11:18:43 AM By: Levan Hurst RN, BSN Signed: 03/12/2020  12:23:53 PM By: Mikeal Hawthorne EMT/HBOT Previous Signature: 03/10/2020 6:02:31 PM Version By: Levan Hurst RN,  BSN Previous Signature: 03/11/2020 1:29:28 PM Version By: Sandre Kitty Entered By: Mikeal Hawthorne on 03/12/2020 09:48:57 -------------------------------------------------------------------------------- Wound Assessment Details Patient Name: Date of Service: Nickelson, Demetria E. 03/10/2020 7:30 AM Medical Record [id number] Patient Account Number: [id number] Date of Birth/Sex: Treating RN: [birth year] (34 y.o. [name] Primary Care Leydi Winstead: [place], [place] Other Clinician: Referring Jilliann Subramanian: Treating Alisia Vanengen/Extender:Robson, [name], [name]: 218 Wound Status Wound Number: 38 Primary Etiology: Inflammatory Wound Location: Right Toe - Web between 1st and Wound Status: Open 2nd Comorbid Sleep Apnea, Hypertension, Wounding Event: Gradually Appeared History: Paraplegia Date Acquired: 11/30/2019 Weeks Of Treatment: 14 Clustered Wound: No Photos Wound Measurements Length: (cm) 0.6 % Reduct Width: (cm) 0.5 % Reduct Depth: (cm) 0.1 Epitheli Area: (cm) 0.236 Tunneli Volume: (cm) 0.024 Undermi Wound Description Classification: Full Thickness Without Exposed Support Foul Odo Structures Slough/F Wound Flat and Intact Margin: Exudate Small Small Amount: Exudate Serosanguineous Type: Exudate red, brown Color: Wound Bed Granulation Amount: Large (67-100%) Granulation Quality: Red Fascia E Necrotic Amount: None Present (0%) Fat Laye Tendon E Muscle E Joint Ex Bone Exp r After Cleansing: No ibrino No Exposed Structure xposed: No r (Subcutaneous Tissue) Exposed: Yes xposed: No xposed: No posed: No osed: No ion in Area: 28.5% ion in Volume: 89.6% alization: Medium (34-66%) ng: No ning: No Treatment Notes Wound #38 (Right Toe - Web between 1st and 2nd) 1. Cleanse With Wound Cleanser 2. Periwound Care Antifungal cream 3. Primary Dressing Applied Calcium Alginate Ag 4. Secondary Dressing ABD Pad Dry Gauze Roll Gauze 5. Secured  With Tape Notes Development worker, international aid) Signed: 03/12/2020 11:18:43 AM By: Levan Hurst RN, BSN Signed: 03/12/2020 12:23:53 PM By: Mikeal Hawthorne EMT/HBOT Previous Signature: 03/10/2020 6:02:31 PM Version By: Levan Hurst RN, BSN Previous Signature: 03/11/2020 1:29:28 PM Version By: Sandre Kitty Entered By: Mikeal Hawthorne on 03/12/2020 09:50:25 -------------------------------------------------------------------------------- Wound Assessment Details Patient Name: Date of Service: Ronning, Lorrie E. 03/10/2020 7:30 AM Medical Record [id number] Patient Account Number: [id number] Date of Birth/Sex: Treating RN: [birth year] (34 y.o. [name] Primary Care Alynah Schone: [place], [place] Other Clinician: Referring Enya Bureau: Treating Rozena Fierro/Extender:Robson, [name], [name]: 218 Wound Status Wound Number: 40 Primary Etiology: Venous Leg Ulcer Wound Location: Left, Lateral Lower Leg Secondary Lymphedema Etiology: Wounding Event: Gradually Appeared Wound Status: Open Date Acquired: 02/11/2020 Comorbid History: Sleep Apnea, Hypertension, Weeks Of Treatment: 4 Paraplegia Clustered Wound: No Photos Wound Measurements Length: (cm) 0.4 % Reduct Width: (cm) 0.3 % Reduct Depth: (cm) 0.1 Epitheli Area: (cm) 0.094 Tunneli Volume: (cm) 0.009 Undermi Wound Description Classification: Full Thickness Without Exposed Support Foul Odo Structures Slough/F Wound Flat and Intact Margin: Exudate Small Amount: Exudate Serosanguineous Type: Exudate red, brown Color: Wound Bed Granulation Amount: Large (67-100%) Granulation Quality: Pink Fascia E Necrotic Amount: None Present (0%) Fat Laye Tendon E Muscle E Joint Ex Bone Exp r After Cleansing: No ibrino Yes Exposed Structure xposed: No r (Subcutaneous Tissue) Exposed: Yes xposed: No xposed: No posed: No osed: No ion in Area: 76.1% ion in Volume: 76.9% alization: Medium (34-66%) ng:  No ning: No Treatment Notes Wound #40 (Left, Lateral Lower Leg) 1. Cleanse With Wound Cleanser Soap and water 2. Periwound Care Antifungal cream TCA Cream 3. Primary Dressing Applied Calcium Alginate Ag Iodoflex 4. Secondary Dressing ABD Pad Dry Gauze 6. Support Layer Applied 4 layer compression Water quality scientist) Signed: 03/12/2020 11:18:43 AM By: Levan Hurst RN,  BSN Signed: 03/12/2020 12:23:53 PM By: Mikeal Hawthorne EMT/HBOT Previous Signature: 03/10/2020 6:02:31 PM Version By: Levan Hurst RN, BSN Previous Signature: 03/11/2020 1:29:28 PM Version By: Sandre Kitty Entered By: Mikeal Hawthorne on 03/12/2020 09:47:53 -------------------------------------------------------------------------------- [place] Details Patient Name: Date of Service: Erdmann, Joy E. 03/10/2020 7:30 AM Medical Record [id number] Patient Account Number: [id number] Date of Birth/Sex: Treating RN: 1986/01/12 (34 y.o. [name] Primary Care Ayren Zumbro: [place], [place] Other Clinician: Referring Laron Boorman: Treating Shuntay Everetts/Extender:Robson, [name], [name]: 218 Vital Signs Time Taken: 07:50 Temperature (F): 97.7 Height (in): 70 Pulse (bpm): 108 Weight (lbs): 216 Respiratory Rate (breaths/min): 20 Body Mass Index (BMI): 31 Blood Pressure (mmHg): 128/65 Reference Range: 80 - 120 mg / dl Electronic Signature(s) Signed: 03/11/2020 1:29:28 PM By: Sandre Kitty Entered By: Sandre Kitty on 03/10/2020 07:50:51

## 2020-03-12 ENCOUNTER — Ambulatory Visit (INDEPENDENT_AMBULATORY_CARE_PROVIDER_SITE_OTHER): Payer: BC Managed Care – PPO | Admitting: Physician Assistant

## 2020-03-12 ENCOUNTER — Ambulatory Visit (HOSPITAL_COMMUNITY)
Admission: RE | Admit: 2020-03-12 | Discharge: 2020-03-12 | Disposition: A | Discharge status: Home / Self Care | Payer: BC Managed Care – PPO | Source: Ambulatory Visit | Attending: Vascular Surgery | Admitting: Vascular Surgery

## 2020-03-12 ENCOUNTER — Other Ambulatory Visit: Payer: Self-pay

## 2020-03-12 VITALS — BP 127/71 | HR 82 | Temp 98.2°F | Resp 16

## 2020-03-12 DIAGNOSIS — Z86718 Personal history of other venous thrombosis and embolism: Secondary | ICD-10-CM | POA: Diagnosis not present

## 2020-03-12 DIAGNOSIS — I872 Venous insufficiency (chronic) (peripheral): Secondary | ICD-10-CM

## 2020-03-12 DIAGNOSIS — S81802A Unspecified open wound, left lower leg, initial encounter: Secondary | ICD-10-CM | POA: Diagnosis not present

## 2020-03-12 IMAGING — CR DG FOOT COMPLETE 3+V*L*
3 series · 3 of 3 positions shown · non-contrast
Comparison: 04/02/2019

CLINICAL DATA: Nonhealing ulcer, dorsum of left foot

EXAM:
LEFT FOOT - COMPLETE 3+ VIEW

[foot ap]
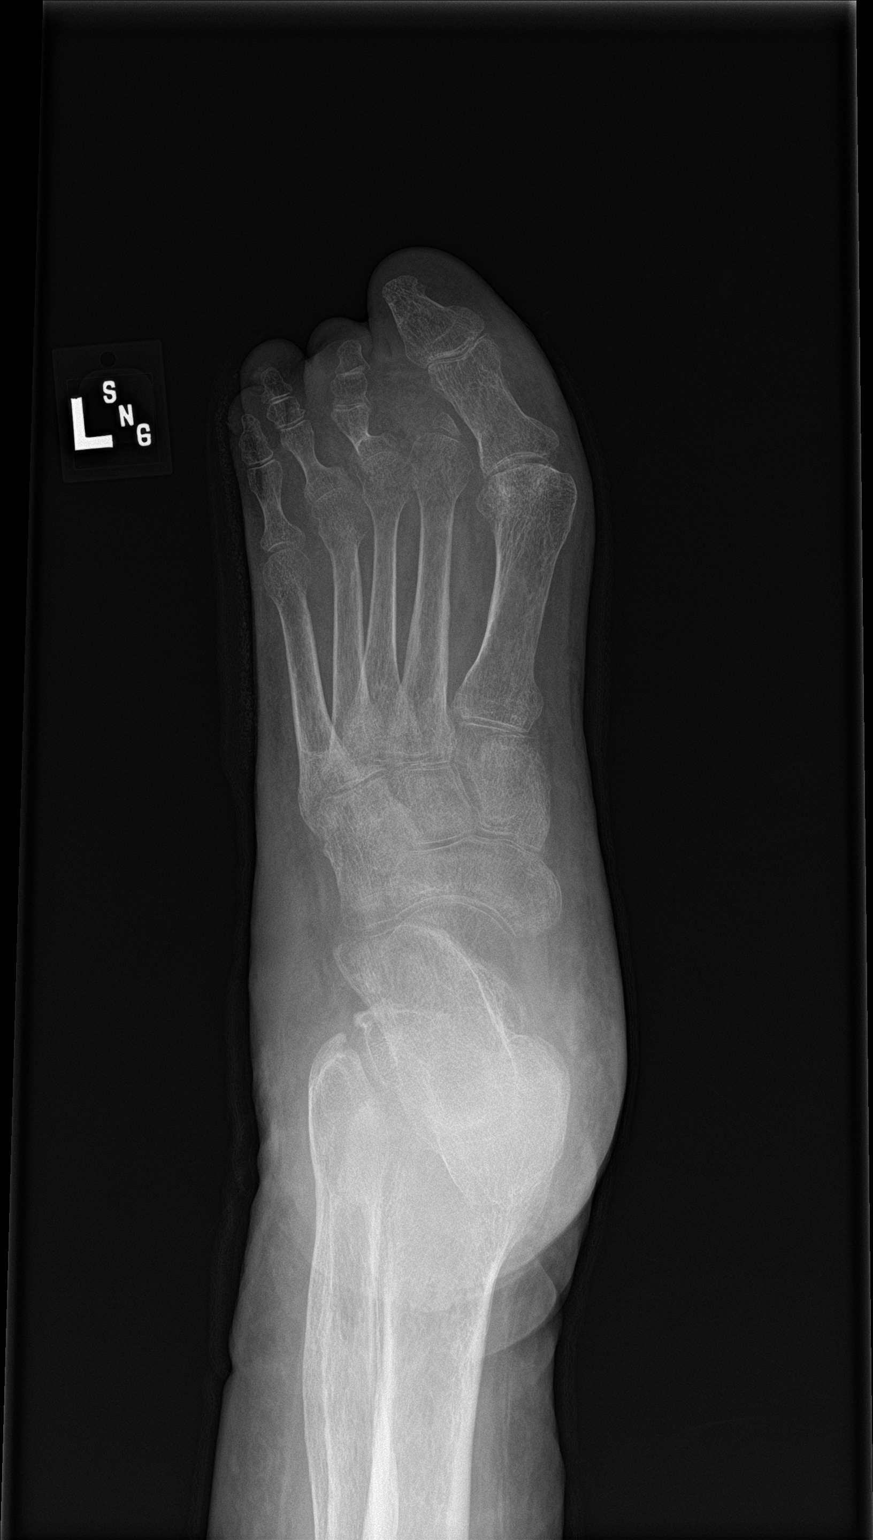

[foot obl]
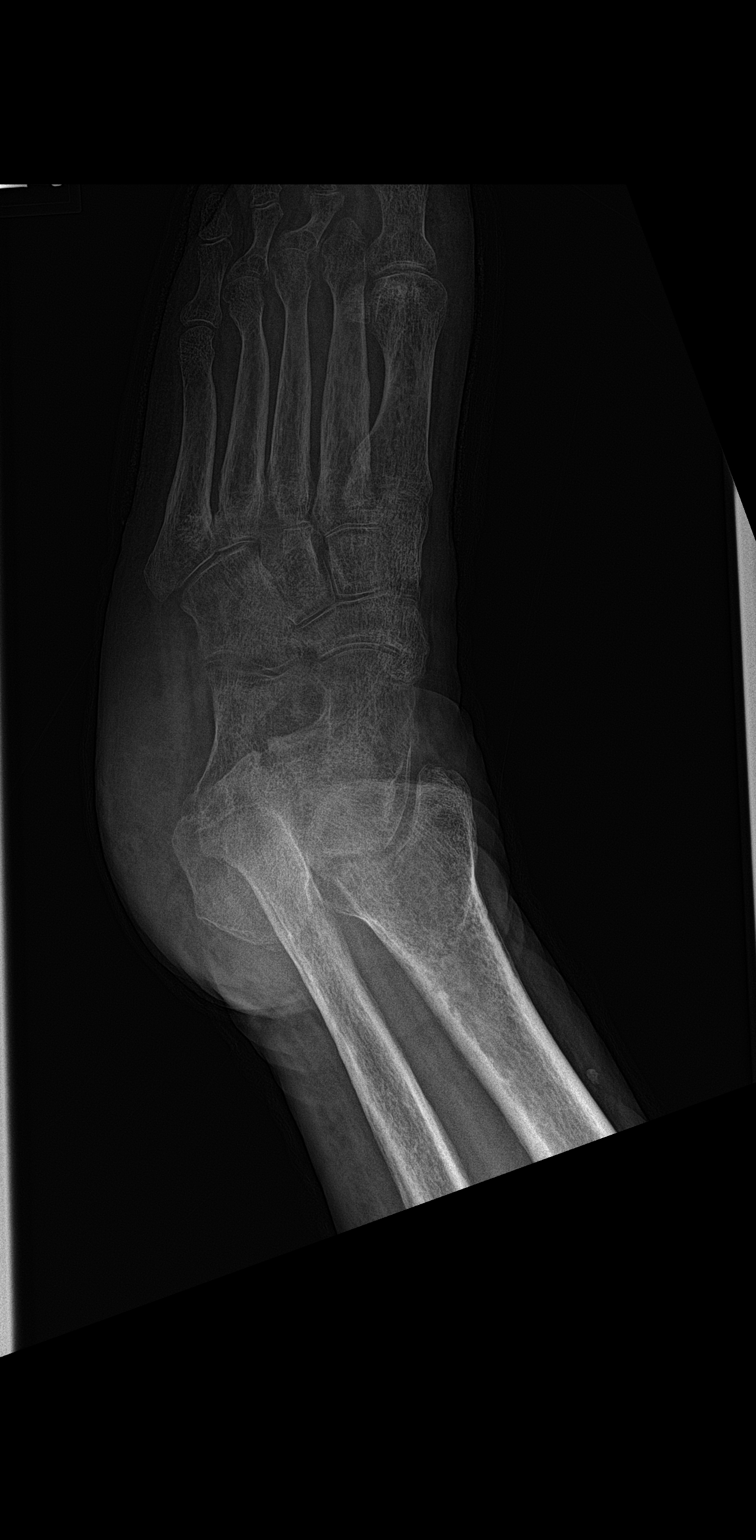

[foot lat]
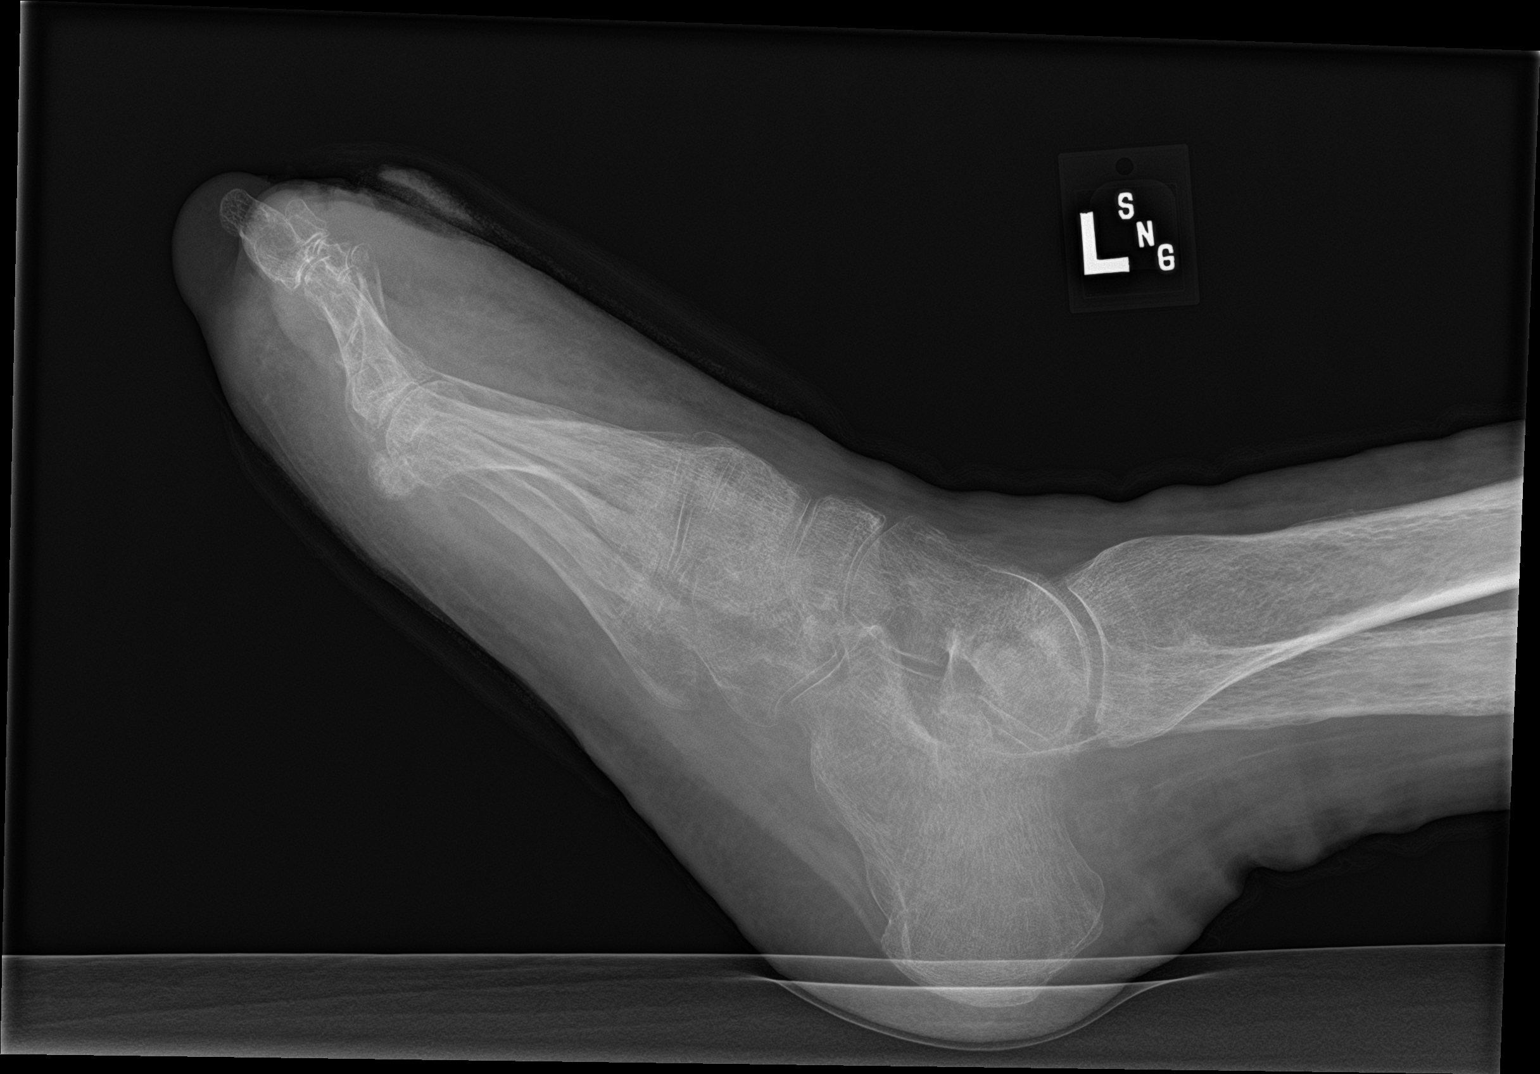

[3 of 3 positions shown; findings below may reference images not displayed]

FINDINGS: Profound osteopenia. The patient is status post digital amputation
of the left second toe. There is no bony erosion or sclerosis to
suggest osteomyelitis. Diffuse soft tissue edema about the forefoot
with a dressed wound of the distal dorsal forefoot. Joint spaces are
well preserved.
IMPRESSION: 1. No radiographic evidence of osteomyelitis. Please note that MRI
is more sensitive for the detection of bone marrow edema and
osteomyelitis, particularly in the setting of osteopenia.
2. Diffuse soft tissue edema with a dressed wound of the distal
dorsal forefoot.
3. Status post second toe amputation.
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
## 2020-03-17 ENCOUNTER — Other Ambulatory Visit: Payer: Self-pay

## 2020-03-17 ENCOUNTER — Encounter (HOSPITAL_BASED_OUTPATIENT_CLINIC_OR_DEPARTMENT_OTHER): Payer: BC Managed Care – PPO | Admitting: Internal Medicine

## 2020-03-17 DIAGNOSIS — Z87891 Personal history of nicotine dependence: Secondary | ICD-10-CM | POA: Diagnosis not present

## 2020-03-17 DIAGNOSIS — L89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, stage 3: Secondary | ICD-10-CM | POA: Diagnosis not present

## 2020-03-17 DIAGNOSIS — L97511 Non-pressure chronic ulcer of other part of right foot limited to breakdown of skin: Secondary | ICD-10-CM | POA: Diagnosis not present

## 2020-03-17 DIAGNOSIS — G8221 Paraplegia, complete: Secondary | ICD-10-CM | POA: Diagnosis not present

## 2020-03-17 DIAGNOSIS — L89224 Pressure ulcer of left hip, stage 4: Secondary | ICD-10-CM | POA: Diagnosis not present

## 2020-03-17 DIAGNOSIS — L97321 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 to breakdown of skin: Secondary | ICD-10-CM | POA: Diagnosis not present

## 2020-03-17 DIAGNOSIS — L03116 Cellulitis of left lower limb: Secondary | ICD-10-CM | POA: Diagnosis not present

## 2020-03-17 DIAGNOSIS — Z89422 Acquired absence of other left toe(s): Secondary | ICD-10-CM | POA: Diagnosis not present

## 2020-03-17 DIAGNOSIS — I87332 Chronic venous hypertension (idiopathic) with ulcer and inflammation of left lower extremity: Secondary | ICD-10-CM | POA: Diagnosis not present

## 2020-03-17 DIAGNOSIS — L97521 Non-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: Secondary | ICD-10-CM | POA: Diagnosis not present

## 2020-03-17 NOTE — Progress Notes (Signed)
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 ago with hydrogel to keep the area moist. This is been refractory to multiple dressing attempts. He also has areas in his first and second toes bilaterally in the anterior and posterior web space. I had been using Iodoflex here after a prolonged course of silver alginate with ketoconazole was ineffective [question tinea pedis] 07/14/17; patient arrives today with a very difficult adherent material over his left lateral lower leg wound. He also has surrounding erythema and poorly controlled edema. He was switched his Santyl last visit which the nurses are applying once during his doctor visit and once on a nurse visit. He was also reduced to 2 layer compression I'm not exactly sure of the issue here. 07/21/17; better surface today after 1 week of Iodoflex. Significant cellulitis that we treated last week also better. [Doxycycline] 07/28/17 better surface today with now 2 weeks of Iodoflex. Significant cellulitis treated with doxycycline. He has now completed the doxycycline and he is back to his usual  degree of chronic venous inflammation/stasis dermatitis. He reminds me he has had ablations surgery here 08/04/17; continued improvement with Iodoflex to the left lateral  leg wound in terms of the surface of the wound although the dimensions are better. 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 Problematic areas on the plantar aspect of the first second toe web space on the left and the dorsal aspect of the first second toe web space on the right. At one point I felt these were probably related to chronic fungal infections in treated him aggressively for this although we have not made any improvement here. 08/11/17; left lateral leg. Surface continues to improve with the Iodoflex although we are not seeing much improvement in overall wound dimensions. Areas on his plantar left foot and right foot show no improvement. In fact the right foot looks somewhat worse 08/18/17; left lateral leg. We changed to [facility] Blue last week after a prolonged course of Iodoflex which helps get the surface better. It appears that the wound with is improved. Continue with difficult areas on the left dorsal first second and plantar first second on the right 09/01/17; patient arrives in clinic today having had a temperature of 103 yesterday. He was seen in the ER and [facility]. The patient was concerned he could have cellulitis again in the right leg however they diagnosed him with a UTI and he is now on Keflex. He has a history of cellulitis which is been recurrent and difficult but this is been in the left leg, in the past 5 use doxycycline. He does in and out catheterizations at home which are risk factors for UTI 09/08/17; patient will be completing his Keflex this weekend. The erythema on the left leg is considerably better. He has a new wound today on the medial part of the right leg small superficial almost looks like a skin tear. He has worsening of the area on the right dorsal first and second toe. His major area on the left lateral leg is better. Using Hydrofera Blue on all areas 09/15/17; gradual reduction  in width on the long wound in the left lateral leg. No debridement required. He also has wounds on the plantar aspect of his left first second toe web space and on the dorsal aspect of the right first second toe web space. 09/22/17; there continues to be very gradual improvements in the dimensions of the left lateral leg wound. He hasn't round erythematous spot with might be pressure on his wheelchair. There is no evidence obviously of infection no purulence no warmth He has a dry scaled area on the plantar aspect of the left first second toe Improved area on the dorsal right first second toe. 09/29/17; left lateral leg wound continues to improve in dimensions mostly with an is still a fairly long but increasingly narrow wound. He has a dry scaled area on the plantar aspect of his left first second toe web space Increasingly concerning area on the dorsal right first second toe. In fact I am concerned today about possible cellulitis around this wound. The areas extending up his second toe and although there is deformities here almost appears to abut on the nailbed. 10/06/17; left lateral leg wound continues to make very gradual progress. Tissue culture I did from the right first second toe dorsal foot last time grew MRSA and enterococcus which was vancomycin sensitive. This was not sensitive to clindamycin or doxycycline. He is allergic to  Zyvox and sulfa we have therefore arrange for him to have dalvance infusion tomorrow. He is had this in the past and tolerated it well 10/20/17; left lateral leg wound continues to make decent progress. This is certainly reduced in terms of with there is advancing epithelialization.The cellulitis in the right foot looks better although he still has a deep wound in the dorsal aspect of the first second toe web space. Plantar left first toe web space on the left I think is making some progress 10/27/17; left lateral leg wound continues to make decent progress.  Advancing epithelialization.using Hydrofera Blue The right first second toe web space wound is better-looking using silver alginate Improvement in the left plantar first second toe web space. Again using silver alginate 11/03/17 left lateral leg wound continues to make decent progress albeit slowly. Using [facility] The right per second toe web space continues to be a very problematic looking punched out wound. I obtained a piece of tissue for deep culture I did extensively treated this for fungus. It is difficult to imagine that this is a pressure area as the patient states other than going outside he doesn't really wear shoes at home The left plantar first second toe web space looked fairly senescent. Necrotic edges. This required debridement change to [facility] Blue to all wound areas 11/10/17; left lateral leg wound continues to contract. Using Hydrofera Blue On the right dorsal first second toe web space dorsally. Culture I did of this area last week grew MRSA there is not an easy oral option in this patient was multiple antibiotic allergies or intolerances. This was only a rare culture isolate I'm therefore going to use Bactroban under silver alginate On the left plantar first second toe web space. Debridement is required here. This is also unchanged 11/17/17; left lateral leg wound continues to contract using Hydrofera Blue this is no longer the major issue. The major concern here is the right first second toe web space. He now has an open area going from dorsally to the plantar aspect. There is now wound on the inner lateral part of the first toe. Not a very viable surface on this. There is erythema spreading medially into the forefoot. No major change in the left first second toe plantar wound 11/24/17; left lateral leg wound continues to contract using Hydrofera Blue. Nice improvement today The right first second toe web space all of this looks a lot less angry than last week. I have  given him clindamycin and topical Bactroban for MRSA and terbinafine for the possibility of underlining tinea pedis that I could not control with ketoconazole. Looks somewhat better The area on the plantar left first second toe web space is weeping with dried debris around the wound 12/01/17; left lateral leg wound continues to contract he Hydrofera Blue. It is becoming thinner in terms of with nevertheless it is making good improvement. The right first second toe web space looks less angry but still a large necrotic-looking wounds starting on the plantar aspect of the right foot extending between the toes and now extensively on the base of the right second toe. I gave him clindamycin and topical Bactroban for MRSA anterior benefiting for the possibility of underlying tinea pedis. Not looking better today The area on the left first/second toe looks better. Debrided of necrotic debris 12/05/17* the patient was worked in urgently today because over the weekend he found blood on his incontinence bad when he woke up. He was found to have an ulcer  by his wife who does most of his wound care. He came in today for [place] to look at this. He has not had a history of wounds in his buttocks in spite of his paraplegia. 12/08/17; seen in follow-up today at his usual appointment. He was seen earlier this week and found to have a new wound on his buttock. We also follow him for wounds on the left lateral leg, left first second toe web space and right first second toe web space 12/15/17; we have been using Hydrofera Blue to the left lateral leg which has improved. The right first second toe web space has also improved. Left first second toe web space plantar aspect looks stable. The left buttock has worsened using Santyl. Apparently the buttock has drainage 12/22/17; we have been using Hydrofera Blue to the left lateral leg which continues to improve now 2 small wounds separated by normal skin. He tells [place] he had a  fever up to 100 yesterday he is prone to UTIs but has not noted anything different. He does in and out catheterizations. The area between the first and second toes today does not look good necrotic surface covered with what looks to be purulent drainage and erythema extending into the third toe. I had gotten this to something that I thought look better last time however it is not look good today. He also has a necrotic surface over the buttock wound which is expanded. I thought there might be infection under here so I removed a lot of the surface with a #5 curet though nothing look like it really needed culturing. He is been using Santyl to this area 12/27/17; his original wound on the left lateral leg continues to improve using Hydrofera Blue. I gave him samples of Baxdella although he was unable to take them out of fear for an allergic reaction ["lump in his throat"].the culture I did of the purulent drainage from his second toe last week showed both enterococcus and a set Enterobacter I was also concerned about the erythema on the bottom of his foot although paradoxically although this looks somewhat better today. 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 We've been using silver alginate here. They came up today that he sleeps in a recliner, I'm not sure why but I asked him to stop this 01/03/18; his original wound we've been using Hydrofera Blue is now separated into 2 areas. Ulcer on his left buttock is better he is off the recliner and sleeping in bed Finally both wound areas between his first and second toes also looks some better 01/10/18; his original wound on the left lateral leg is now separated into 2 wounds we've been using Hydrofera Blue Ulcer on his left buttock has some drainage. There is a small probing site going into muscle layer superiorly.using silver alginate -He arrives today with a  deep tissue injury on the left heel The wound on the dorsal aspect of his first second toe on the left looks a lot betterusing silver alginate ketoconazole The area on the first second toe web space on the right also looks a lot bette 01/17/18; his original wound on the left lateral leg continues to progress using Hydrofera Blue Ulcer on his left buttock also is smaller surface healthier except for a small probing site going into the muscle layer superiorly. 2.4 cm of tunneling in this area DTI on his left heel we  have only been offloading. Looks better than last week no threatened open no evidence of infection the wound on the dorsal aspect of the first second toe on the left continues to look like it's regressing we have only been using silver alginate and terbinafine orally The area in the first second toe web space on the right also looks to be a lot better using silver alginate and terbinafine I think this was prompted by tinea pedis 01/31/18; the patient was hospitalized in [place] last week apparently for a complicated UTI. He was discharged on cefepime he does in and out catheterizations. In the hospital he was discovered M I don't mild elevation of AST and ALTs and the terbinafine was stopped.predictably the pressure ulcer on his buttock looks betterusing silver alginate. The area on the left lateral leg also is better using Hydrofera Blue. The area between the first and second toes on the left better. First and second toes on the right still substantial but better. Finally the DTI on the left heel has held together and looks like it's resolving 02/07/18-he is here in follow-up evaluation for multiple ulcerations. He has new injury to the lateral aspect of the last issue a pressure ulcer, he states this is from adhesive removal trauma. He states he has tried multiple adhesive products with no success. All other ulcers appear stable. The left heel DTI is resolving. We will continue with  same treatment plan and follow-up next week. 02/14/18; follow-up for multiple areas. He has a new area last week on the lateral aspect of his pressure ulcer more over the posterior trochanter. The original pressure ulcer looks quite stable has healthy granulation. 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 Almost fully epithelialized on the original superior area using Hydrofera Blue DTI on the left heel has peeled off this week to reveal a small superficial wound under denuded skin and subcutaneous tissue Both areas between the first and second toes look better including nothing open on the left 02/21/18; The patient's wounds on his left ischial tuberosity and posterior left greater trochanter actually looked better. He has a large area of irritation around the area which I think is contact dermatitis. I am doubtful that this is fungal His original wound on the left lateral calf continues to improve we have been using Hydrofera Blue There is no open area in the left first second toe web space although there is a lot of thick callus The DTI on the left heel required debridement today of necrotic surface eschar and subcutaneous tissue using silver alginate Finally the area on the right first second toe webspace continues to contract using silver alginate and ketoconazole 02/28/18 Left ischial tuberosity wounds look better using silver alginate. Original wound on the left calf only has one small open area left using Hydrofera Blue DTI on the left heel required debridement mostly removing skin from around this wound surface. Using silver alginate The areas on the right first/second toe web space using silver alginate and ketoconazole 03/08/18 on evaluation today patient appears to be doing decently well as best I can tell in regard to his wounds. This is the first time that I have seen him as he  generally is followed by Dr. Dellia Nims. With that being said none of his wounds appear to be infected he does have an area where there is some skin covering what appears to be a new wound  on the left dorsal surface of his great toe. This is right at the nail bed. With that being said I do believe that debrided away some of the excess skin can be of benefit in this regard. Otherwise he has been tolerating the dressing changes without complication. 03/14/18; patient arrives today with the multiplicity of wounds that we are following. He has not been systemically unwell Original wound on the left lateral calf now only has 2 small open areas we've been using Hydrofera Blue which should continue The deep tissue injury on the left heel requires debridement today. We've been using silver alginate The left first second toe and the right first second toe are both are reminiscence what I think was tinea pedis. Apparently some of the callus Surface between the toes was removed last week when it started draining. Purulent drainage coming from the wound on the ischial tuberosity on the left. 03/21/18-He is here in follow-up evaluation for multiple wounds. 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 He tolerated debridement. The only change to last week's recommendations is to discontinue antifungal cream between toes. He will follow-up next week 03/28/18; following up for multiple wounds;Concern this week is streaking redness and swelling in the right foot. He is going to need antibiotics for this. 03/31/18; follow-up for right foot cellulitis. Streaking redness and swelling in the right foot on 03/28/18. 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 I put him on clindamycin 300 mg every 6 and brought him in for a quick check. He has an open wound between his first and second toes on the right foot as a potential  source. 04/04/18; Right foot cellulitis is resolving he is completing clindamycin. This is truly good news Left lateral calf wound which is initial wound only has one small open area inferiorly this is close to healing out. He has compression stockings. We will use Hydrofera Blue right down to the epithelialization of this Nonviable surface on the left heel which was initially pressure with a DTI. We've been using Hydrofera Blue. I'm going to switch this back to silver alginate Left first second toe/tinea pedis this looks better using silver alginate Right first second toe tinea pedis using silver alginate Large pressure ulcers on theLeft ischial tuberosity. Small wound here Looks better. I am uncertain about the surface over the large wound. Using silver alginate 04/11/18; Cellulitis in the right foot is resolved Left lateral calf wound which was his original wounds still has 2 tiny open areas remaining this is just about closed Nonviable surface on the left heel is better but still requires debridement Left first second toe/tinea pedis still open using silver alginate Right first second toe wound tinea pedis I asked him to go back to using ketoconazole and silver alginate Large pressure ulcers on the left ischial tuberosity this shear injury here is resolved. Wound is smaller. No evidence of infection using silver alginate 04/18/18; Patient arrives with an intense area of cellulitis in the right mid lower calf extending into the right heel area. Bright red and warm. Smaller area on the left anterior leg. He has a significant history of MRSA. He will definitely need antibioticsdoxycycline He now has 2 open areas on the left ischial tuberosity the original large wound and now a satellite area which I think was above his initial satellite areas. Not a wonderful surface on this satellite area surrounding erythema which looks like pressure related. His left lateral  calf wound again his original  wound is just about closed Left heel pressure injury still requiring debridement Left first second toe looks a lot better using silver alginate Right first second toe also using silver alginate and ketoconazole cream also looks better 04/20/18; the patient was worked in early today out of concerns with his cellulitis on the right leg. I had started him on doxycycline. This was 2 days ago. His wife was concerned about the swelling in the area. Also concerned about the left buttock. He has not been systemically unwell no fever chills. No nausea vomiting or diarrhea 04/25/18; the patient's left buttock wound is continued to deteriorate he is using Hydrofera Blue. He is still completing clindamycin for the cellulitis on the right leg although all of this looks better. 05/02/18 Left buttock wound still with a lot of drainage and a very tightly adherent fibrinous necrotic surface. He has a deeper area superiorly The left lateral calf wound is still closed DTI wound on the left heel necrotic surface especially the circumference using Iodoflex Areas between his left first second toe and right first second toe both look better. Dorsally and the right first second toe he had a necrotic surface although at smaller. In using silver alginate and ketoconazole. I did a culture last week which was a deep tissue culture of the reminiscence of the open wound on the right first second toe dorsally. This grew a few Acinetobacter and a few methicillin-resistant staph aureus. Nevertheless the area actually this week looked better. I didn't feel the need to specifically address this at least in terms of systemic antibiotics. 05/09/18; wounds are measuring larger more drainage per our intake. We are using Santyl covered with alginate on the large superficial buttock wounds, Iodosorb on the left heel, ketoconazole and silver alginate to the dorsal first and second toes bilaterally. 05/16/18; The area on his left buttock  better in some aspects although the area superiorly over the ischial tuberosity required an extensive debridement.using Santyl Left heel appears stable. Using Iodoflex The areas between his first and second toes are not bad however there is spreading erythema up the dorsal aspect of his left foot this looks like cellulitis again. He is insensate the erythema is really very brilliant.o Erysipelas He went to see an allergist days ago because he was itching part of this he had lab work done. This showed a white count of 15.1 with 70% neutrophils. Hemoglobin of 11.4 and a platelet count of 659,000. Last white count we had in Epic was a 2-1/2 years ago which was 25.9 but he was ill at the time. He was able to show me some lab work that was done by his primary physician the pattern is about the same. 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 But prompted me to go ahead and do x-rays of both feet and the pelvis rule out osteomyelitis. He also had a comprehensive metabolic panel this was reasonably normal his albumin was 3.7 liver function tests BUN/creatinine all normal 05/23/18; x-rays of both his feet from last week were negative for underlying pulmonary abnormality. The x-ray of his pelvis however showed mild irregularity in the left ischial which may represent some early osteomyelitis. The wound in the left ischial continues to get deeper clearly now exposed muscle. Each week necrotic surface material over this area. Whereas the rest of the wounds do not look so bad. The left ischial wound we have been using  Santyl and calcium alginate To the left heel surface necrotic debris using Iodoflex The left lateral leg is still healed Areas on the left dorsal foot and the right dorsal foot are about the same. There is some inflammation on the left which might represent contact dermatitis, fungal dermatitis I am doubtful cellulitis although this looks better than  last week 05/30/18; CT scan done at Hospital did not show any osteomyelitis or abscess. Suggested the possibility of underlying cellulitis although I don't see a lot of evidence of this at the bedside The wound itself on the left buttock/upper thigh actually looks somewhat better. No debridement Left heel also looks better no debridement continue Iodoflex Both dorsal first second toe spaces appear better using Lotrisone. Left still required debridement 06/06/18; Intake reported some purulent looking drainage from the left gluteal wound. Using Santyl and calcium alginate Left heel looks better although still a nonviable surface requiring debridement The left dorsal foot first/second webspace actually expanding and somewhat deeper. I may consider doing a shave biopsy of this area Right dorsal foot first/second webspace appears stable to improved. Using Lotrisone and silver alginate to both these areas 06/13/18 Left gluteal surface looks better. Now separated in the 2 wounds. No debridement required. Still drainage. We'll continue silver alginate Left heel continues to look better with Iodoflex continue this for at least another week Of his dorsal foot wounds the area on the left still has some depth although it looks better than last week. We've been using Lotrisone and silver alginate 06/20/18 Left gluteal continues to look better healthy tissue Left heel continues to look better healthy granulation wound is smaller. He is using Iodoflex and his long as this continues continue the Iodoflex Dorsal right foot looks better unfortunately dorsal left foot does not. There is swelling and erythema of his forefoot. He had minor trauma to this several days ago but doesn't think this was enough to have caused any tissue injury. Foot looks like cellulitis, we have had this problem before 06/27/18 on evaluation today patient appears to be doing a little worse in regard to his foot ulcer. 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 Both of which would really be his only options for treating this infection. In the past he has been given and effusion of Orbactiv. This is done very well for him in the past again it's one time dosing IV antibiotic therapy. Subsequently I do believe this is something we're gonna need to see about doing at this point in time. Currently his other wounds seem to be doing somewhat better in my pinion I'm pretty happy in that regard. 07/03/18 on evaluation today patient's wounds actually appear to be doing fairly well. He has been tolerating the dressing changes without complication. All in all he seems to be showing signs of improvement. In regard to the antibiotics he has been dealing with infectious disease since I saw him last week as far as getting this scheduled. In the end [facility]'s [facility] this coming Friday. In the meantime he has been continuing to perform the dressing changes in such as previous. There does not appear to be any evidence of infection worsengin at this time. 07/10/18; Since I last saw this man 2 weeks ago things have actually improved. IV antibiotics of resulted  in less forefoot erythema although there is still some present. He is not systemically unwell Left buttock wounds 2 now have no depth there is increased epithelialization Using silver alginate Left heel still requires debridement using Iodoflex Left dorsal foot still with a sizable wound about the size of a border but healthy granulation Right dorsal foot still with a slitlike area using silver alginate 07/18/18; the patient's cellulitis in the left foot is improved in fact I think it is on its way to resolving. Left buttock wounds 2 both look better although the larger one has hypertension  granulation we've been using silver alginate Left heel has some thick circumferential redundant skin over the wound edge which will need to be removed today we've been using Iodoflex Left dorsal foot is still a sizable wound required debridement using silver alginate The right dorsal foot is just about closed only a small open area remains here 07/25/18; left foot cellulitis is resolved Left buttock wounds 2 both look better. Hyper-granulation on the major area Left heel as some debris over the surface but otherwise looks a healthier wound. Using silver collagen Right dorsal foot is just about closed 07/31/18; arrives with our intake nurse worried about purulent drainage from the buttock. We had hyper-granulation here last week His buttock wounds 2 continue to look better Left heel some debris over the surface but measuring smaller. Right dorsal foot unfortunately has openings between the toes Left foot superficial wound looks less aggravated. 08/07/18 Buttock wounds continue to look better although some of her granulation and the larger medial wound. silver alginate Left heel continues to look a lot better.silver collagen Left foot superficial wound looks less stable. Requires debridement. He has a new wound superficial area on the foot on the lateral dorsal foot. Right foot looks better using silver alginate without Lotrisone 08/14/2018; patient was in the ER last week diagnosed with a UTI. He is now on Cefpodoxime and Macrodantin. Buttock wounds continued to be smaller. Using silver alginate Left heel continues to look better using silver collagen Left foot superficial wound looks as though it is improving Right dorsal foot area is just about healed. 08/21/2018; patient is completed his antibiotics for his UTI. He has 2 open areas on the buttocks. There is still not closed although the surface looks satisfactory. Using silver alginate Left heel continues to improve using silver  collagen The bilateral dorsal foot areas which are at the base of his first and second toes/possible tinea pedis are actually stable on the left but worse on the right. The area on the left required debridement of necrotic surface. After debridement I obtained a specimen for PCR culture. The right dorsal foot which is been just about healed last week is now reopened 08/28/2018; culture done on the left dorsal foot showed coag negative staph both staph epidermidis and Lugdunensis. I think this is worthwhile initiating systemic treatment. I will use doxycycline given his long list of allergies. The area on the left heel slightly improved but still requiring debridement. The large wound on the buttock is just about closed whereas the smaller one is larger. Using silver alginate in this area 09/04/2018; patient is completing his doxycycline for the left foot although this continues to be a very difficult wound area with very adherent necrotic debris. We are using silver alginate to all his wounds right foot left foot and the small wounds on his buttock, silver collagen on the left heel. 09/11/2018; once again this patient has intense erythema and swelling  of the left forefoot. Lesser degrees of erythema in the right foot. He has a long list of allergies and intolerances. I will reinstitute doxycycline. 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 Using silver alginate Left heel also looks better using silver collagen Unfortunately both the areas on his feet look worse. The area on the left first second webspace is now gone through to the plantar part of his foot. The area on the left foot anteriorly is irritated with erythema and swelling in the forefoot. 09/25/2018 His wound on the left plantar heel looks better. Using silver collagen The area on the left buttock 2 small remnant areas. One is closed one is still open. Using silver alginate The areas  between both his first and second toes look worse. This in spite of long-standing antifungal therapy with ketoconazole and silver alginate which should have antifungal activity He has small areas around his original wound on the left calf one is on the bottom of the original scar tissue and one superiorly both of these are small and superficial but again given wound history in this site this is worrisome 10/02/2018 Left plantar heel continues to gradually contract using silver collagen Left buttock wound is unchanged using silver alginate The areas on his dorsal feet between his first and second toes bilaterally look about the same. I prescribed clindamycin ointment to see if we can address chronic staph colonization and also the underlying possibility of erythrasma The left lateral lower extremity wound is actually on the lateral part of his ankle. Small open area here. We have been using silver alginate 10/09/2018; Left plantar heel continues to look healthy and contract. No debridement is required Left buttock slightly smaller with a tape injury wound just below which was new this week Dorsal feet somewhat improved I have been using clindamycin Left lateral looks lower extremity the actual open area looks worse although a lot of this is epithelialized. I am going to change to silver collagen today He has a lot more swelling in the right leg although this is not pitting not red and not particularly warm there is a lot of spasm in the right leg usually indicative of people with paralysis of some underlying discomfort. We have reviewed his vascular status from 2017 he had a left greater saphenous vein ablation. I wonder about referring him back to vascular surgery if the area on the left leg continues to deteriorate. 10/16/2018 in today for follow-up and management of multiple lower extremity ulcers. His left Buttock wound is much lower smaller and almost closed completely. The wound to the  left ankle has began to reopen with Epithelialization and some adherent slough. He has multiple new areas to the left foot and leg. The left dorsal foot without much improvement. Wound present between left great webspace and 2nd toe. Erythema and edema present right leg. Right LE ultrasound obtained on 10/10/18 was negative for DVT. 10/23/2018; Left buttock is closed over. Still dry macerated skin but there is no open wound. I suspect this is chronic pressure/moisture Left lateral calf is quite a bit worse than when I saw this last. There is clearly drainage here he has macerated skin into the left plantar heel. We will change the primary dressing to alginate Left dorsal foot has some improvement in overall wound area. Still using clindamycin and silver alginate Right dorsal foot about the same as the left using clindamycin and silver alginate The erythema  in the right leg has resolved. He is DVT rule out was negative Left heel pressure area required debridement although the wound is smaller and the surface is health 10/26/2018 The patient came back in for his nurse check today predominantly because of the drainage coming out of the left lateral leg with a recent reopening of his original wound on the left lateral calf. He comes in today with a large amount of surrounding erythema around the wound extending from the calf into the ankle and even in the area on the dorsal foot. He is not systemically unwell. He is not febrile. Nevertheless this looks like cellulitis. We have been using silver alginate to the area. I changed him to a regular visit and I am going to prescribe him doxycycline. The rationale here is a long list of medication intolerances and a history of MRSA. I did not see anything that I thought would provide a valuable culture 10/30/2018 Follow-up from his appointment 4 days ago with really an extensive area of cellulitis in the left calf left lateral ankle and left dorsal foot. I  put him on doxycycline. He has a long list of medication allergies which are true allergy reactions. 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 In any case he is a lot better today. The erythema especially in the anterior and lateral left calf is better. He still has left ankle erythema. He also is complaining about increasing edema in the right leg we have only been using Kerlix Coban and he has been doing the wraps at home. Finally he has a spotty rash on the medial part of his upper left calf which looks like folliculitis or perhaps wrap occlusion type injury. Small superficial macules not pustules 11/06/18 patient arrives today with again a considerable degree of erythema around the wound on the left lateral calf extending into the dorsal ankle and dorsal foot. This is a lot worse than when I saw this last week. He is on doxycycline really with not a lot of improvement. He has not been systemically unwell Wounds on the; left heel actually looks improved. Original area on the left foot and proximity to the first and second toes looks about the same. He has superficial areas on the dorsal foot, anterior calf and then the reopening of his original wound on the left lateral calf which looks about the same The only area he has on the right is the dorsal webspace first and second which is smaller. He has a large area of dry erythematous skin on the left buttock small open area here. 11/13/2018; the patient arrives in much better condition. The erythema around the wound on the left lateral calf is a lot better. Not sure whether this was the clindamycin or the TCA and ketoconazole or just in the improvement in edema control [stasis dermatitis]. In any case this is a lot better. The area on the left heel is very small and just about resolved using silver collagen we have been using silver alginate to the areas on his dorsal  feet 11/20/2018; his wounds include the left lateral calf, left heel, dorsal aspects of both feet just proximal to the first second webspace. He is stable to slightly improved. I did not think any changes to his dressings were going to be necessary 11/27/2018 he has a reopening on the left buttock which is surrounded by what looks like tinea or perhaps some other form of  dermatitis. The area on the left dorsal foot has some erythema around it I have marked this area but I am not sure whether this is cellulitis or not. Left heel is not closed. Left calf the reopening is really slightly longer and probably worse 1/13; in general things look better and smaller except for the left dorsal foot. Area on the left heel is just about closed, left buttock looks better only a small wound remains in the skin looks better [using Lotrisone] 1/20; the area on the left heel only has a few remaining open areas here. Left lateral calf about the same in terms of size, left dorsal foot slightly larger right lateral foot still not closed. The area on the left buttock has no open wound and the surrounding skin looks a lot better 1/27; the area on the left heel is closed. Left lateral calf better but still requiring extensive debridements. The area on his left buttock is closed. He still has the open areas on the left dorsal foot which is slightly smaller in the right foot which is slightly expanded. We have been using Iodoflex on these areas as well 2/3; left heel is closed. Left lateral calf still requiring debridement using Iodoflex there is no open area on his left buttock however he has dry scaly skin over a large area of this. Not really responding well to the Lotrisone. Finally the areas on his dorsal feet at the level of the first second webspace are slightly smaller on the right and about the same on the left. Both of these vigorously debrided with Anasept and gauze 2/10; left heel remains closed he has dry  erythematous skin over the left buttock but there is no open wound here. Left lateral leg has come in and with. Still requiring debridement we have been using Iodoflex here. Finally the area on the left dorsal foot and right dorsal foot are really about the same extremely dry callused fissured areas. He does not yet have a dermatology appointment 2/17; left heel remains closed. He has a new open area on the left buttock. The area on the left lateral calf is bigger longer and still covered in necrotic debris. No major change in his foot areas bilaterally. I am awaiting for a dermatologist to look on this. We have been using ketoconazole I do not know that this is been doing any good at all. 2/24; left heel remains closed. The left buttock wound that was new reopening last week looks better. The left lateral calf appears better also although still requires debridement. The major area on his foot is the left first second also requiring debridement. We have been putting Prisma on all wounds. I do not believe that the ketoconazole has done too much good for his feet. He will use Lotrisone I am going to give him a 2-week course of terbinafine. We still do not have a dermatology appointment 3/2 left heel remains closed however there is skin over bone in this area I pointed this out to him today. The left buttock wound is epithelialized but still does not look completely stable. The area on the left leg required debridement were using silver collagen here. With regards to his feet we changed to Lotrisone last week and silver alginate. 3/9; left heel remains closed. Left buttock remains closed. The area on the right foot is essentially closed. The left foot remains unchanged. Slightly smaller on the left lateral calf. Using silver collagen to both of these areas 3/16-Left heel  remains closed. Area on right foot is closed. Left lateral calf above the lateral malleolus open wound requiring debridement with  easy bleeding. Left dorsal wound proximal to first toe also debrided. Left ischial area open new. Patient has been using Prisma with wrapping every 3 days. Dermatology appointment is apparently tomorrow.Patient has completed his terbinafine 2-week course with some apparent improvement according to him, there is still flaking and dry skin in his foot on the left 3/23; area on the right foot is reopened. The area on the left anterior foot is about the same still a very necrotic adherent surface. He still has the area on the left leg and reopening is on the left buttock. He apparently saw dermatology although I do not have a note. According to the patient who is usually fairly well informed they did not have any good ideas. Put him on oral terbinafine which she is been on before. 3/30; using silver collagen to all wounds. Apparently his dermatologist put him on doxycycline and rifampin presumably some culture grew staph. I do not have this result. He remains on terbinafine although I have used terbinafine on him before 4/6; patient has had a fairly substantial reopening on the right foot between the first and second toes. He is finished his terbinafine and I believe is on doxycycline and rifampin still as prescribed by dermatology. We have been using silver collagen to all his wounds although the patient reports that he thinks silver alginate does better on the wounds on his buttock. 4/13; the area on his left lateral calf about the same size but it did not require debridement. Left dorsal foot just proximal to the webspace between the first and second toes is about the same. Still nonviable surface. I note some superficial bronze discoloration of the dorsal part of his foot Right dorsal foot just proximal to the first and second toes also looks about the same. I still think there may be the same discoloration I noted above on the left Left buttock wound looks about the same 4/20; left lateral calf  appears to be gradually contracting using silver collagen. 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 The left dorsal foot wound is debrided of tightly adherent necrotic debris and really cleans up quite nicely. The right area is worse with expansion. I did not debride this it is now over the base of the second toe The area on his left buttock is smaller no debridement is required using silver collagen 5/4; left calf continues to make good progress. He arrives with erythema around the wounds on his dorsal foot which even extends to the plantar aspect. Very concerning for coexistent infection. He is finished the erythromycin I gave him for possible erythrasma this does not seem to have helped. The area on the left foot is about the same base of the dorsal toes Is area on the buttock looks improved on the left 5/11; left calf and left buttock continued to make good progress. Left foot is about the same to slightly improved. Major problem is on the right foot. He has not had an x-ray. Deep tissue culture I did last week showed both Enterobacter and E. coli. I did not change the doxycycline I put him on empirically although neither 1 of these were plated to doxycycline. He arrives today with the erythema looking worse on both the dorsal and plantar foot. Macerated skin on the bottom of the foot. he has not been systemically unwell  5/18-Patient returns at 1 week, left calf wound appears to be making some progress, left buttock wound appears slightly worse than last time, left foot wound looks slightly better, right foot redness is marginally better. X-ray of both feet show no air or evidence of osteomyelitis. Patient is finished his Omnicef and terbinafine. He continues to have macerated skin on the bottom of the left foot as well as right 5/26; left calf wound is better, left buttock wound appears to have multiple small superficial open areas  with surrounding macerated skin. X-rays that I did last time showed no evidence of osteomyelitis in either foot. He is finished cefdinir and doxycycline. I do not think that he was on terbinafine. He continues to have a large superficial open area on the right foot anterior dorsal and slightly between the first and second toes. I did send him to dermatology 2 months ago or so wondering about whether they would do a fungal scraping. I do not believe they did but did do a culture. We have been using silver alginate to the toe areas, he has been using antifungals at home topically either ketoconazole or Lotrisone. We are using silver collagen on the left foot, silver alginate on the right, silver collagen on the left lateral leg and silver alginate on the left buttock 6/1; left buttock area is healed. We have the left dorsal foot, left lateral leg and right dorsal foot. We are using silver alginate to the areas on both feet and silver collagen to the area on his left lateral calf 6/8; the left buttock apparently reopened late last week. He is not really sure how this happened. He is tolerating the terbinafine. Using silver alginate to all wounds 6/15; left buttock wound is larger than last week but still superficial. Came in the clinic today with a report of purulence from the left lateral leg I did not identify any infection Both areas on his dorsal feet appear to be better. He is tolerating the terbinafine. Using silver alginate to all wounds 6/22; left buttock is about the same this week, left calf quite a bit better. His left foot is about the same however he comes in with erythema and warmth in the right forefoot once again. Culture that I gave him in the beginning of May showed Enterobacter and E. coli. I gave him doxycycline and things seem to improve although neither 1 of these organisms was specifically plated. 6/29; left buttock is larger and dry this week. Left lateral calf looks to me to  be improved. Left dorsal foot also somewhat improved right foot completely unchanged. The erythema on the right foot is still present. He is completing the Ceftin dinner that I gave him empirically [see discussion above.) 7/6 - All wounds look to be stable and perhaps improved, the left buttock wound is slightly smaller, per patient bleeds easily, completed ceftin, the right foot redness is less, he is on terbinafine 7/13; left buttock wound about the same perhaps slightly narrower. Area on the left lateral leg continues to narrow. Left dorsal foot slightly smaller right foot about the same. We are using silver alginate on the right foot and Hydrofera Blue to the areas on the left. Unna boot on the left 2 layer compression on the right 7/20; left buttock wound absolutely the same. Area on lateral leg continues to get better. Left dorsal foot require debridement as did the right no major change in the 7/27; left buttock wound the same size necrotic debris over  the surface. The area on the lateral leg is closed once again. His left foot looks better right foot about the same although there is some involvement now of the posterior first second toe area. He is still on terbinafine which I have given him for a month, not certain a centimeter major change 06/25/19-All wounds appear to be slightly improved according to report, left buttock wound looks clean, both foot wounds have minimal to no debris the right dorsal foot has minimal slough. We are using Hydrofera Blue to the left and silver alginate to the right foot and ischial wound. 8/10-Wounds all appear to be around the same, the right forefoot distal part has some redness which was not there before, however the wound looks clean and small. Ischial wound looks about the same with no changes 8/17; his wound on the left lateral calf which was his original chronic venous insufficiency wound remains closed. Since I last saw him the areas on the left  dorsal foot right dorsal foot generally appear better but require debridement. The area on his left initial tuberosity appears somewhat larger to me perhaps hyper granulated and bleeds very easily. We have been using Hydrofera Blue to the left dorsal foot and silver alginate to everything else 8/24; left lateral calf remains closed. The areas on his dorsal feet on the webspace of the first and second toes bilaterally both look better. The area on the left buttock which is the pressure ulcer stage II slightly smaller. I change the dressing to Hydrofera Blue to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 widespread reflux in the left at the common femoral vein the greater and lesser saphenous vein the greater and lesser saphenous vein on the right as well as the popliteal and femoral vein. The popliteal and femoral vein on the left did not show reflux. His wounds on the right leg give healed on the left he is still using Santyl. 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 The erythema has not really resolved and I wonder if this is mostly venous inflammation rather than cellulitis. Still using Santyl. He is approved for Apligraf 03/04/16; there is less erythema around the wound. Both wounds require aggressive surgical debridement. Not yet ready for Apligraf 03/11/16; aggressive debridement again. Not ready for Apligraf 03/18/16 aggressive debridement again. 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 Has been to see vascular surgery he is being planned for a venous ablation 03/25/16; aggressive debridement again of both wound areas on the left lateral leg. He is due for ablation surgery on May 22. He is much closer to being ready for an Apligraf. Has a new area between the left first and second toes 04/01/16 aggressive debridement done of both wounds. The new wound at the base of between his second and first toes looks stable 04/08/16; continued aggressive debridement of both wounds on the left lower leg. He goes for his venous ablation on Monday. The new wound at the base of his first and second toes dorsally appears stable. 04/15/16; wounds aggressively debridement although the base of this looks considerably better Apligraf #1. He had ablation surgery on Monday I'll need to research these records. We only have approval for four Apligraf's 04/22/16; the patient is here for a wound check [Apligraf last week] intake nurse concerned about erythema around the wounds. Apparently a significant degree of drainage. The patient has chronic venous inflammation which I think accounts for most of this however I was asked to look at this today 04/26/16; the patient came back for check of possible cellulitis in his left foot however the Apligraf dressing was inadvertently removed therefore we elected to prep the wound for a second Apligraf. I put him on doxycycline on 6/1 the erythema in the foot 05/03/16 we did not  remove the dressing from the superior wound as this is where I put all of his last Apligraf. Surface debridement done with a curette of the lower wound which looks very healthy. The area on the left foot also looks quite satisfactory at the dorsal artery at the first and second toes 05/10/16; continue Apligraf to this. Her wound, Hydrafera to the lower wound. He has a new area on the right second toe. Left dorsal foot firstoosecond toe also looks improved 05/24/16; wound dimensions must be smaller I was able to use Apligraf to all 3 remaining wound areas. 06/07/16 patient's last Apligraf was 2 weeks ago. He arrives today with the 2 wounds on his lateral left leg joined together. This would have to be seen as a negative. He also has a small wound in his first and second toe on the left dorsally with quite a bit of surrounding erythema in the first second and third toes. This looks to be infected or inflamed, very difficult clinical call. 06/21/16: lateral left leg combined wounds. Adherent surface slough area on the left dorsal foot at roughly the fourth toe looks improved 07/12/16;  he now has a single linear wound on the lateral left leg. This does not look to be a lot changed from when I lost saw this. The area on his dorsal left foot looks considerably better however. 08/02/16; no major change in the substantial area on his left lateral leg since last time. We have been using Hydrofera Blue for a prolonged period of time now. The area on his left foot is also unchanged from last review 07/19/16; the area on his dorsal foot on the left looks considerably smaller. He is beginning to have significant rims of epithelialization on the lateral left leg wound. This also looks better. 08/05/16; the patient came in for a nurse visit today. Apparently the area on his left lateral leg looks better and it was wrapped. However in general discussion the patient noted a new area on the dorsal aspect of his right  second toe. The exact etiology of this is unclear but likely relates to pressure. 08/09/16 really the area on the left lateral leg did not really look that healthy today perhaps slightly larger and measurements. The area on his dorsal right second toe is improved also the left foot wound looks stable to improved 08/16/16; the area on the last lateral leg did not change any of dimensions. Post debridement with a curet the area looked better. Left foot wound improved and the area on the dorsal right second toe is improved 08/23/16; the area on the left lateral leg may be slightly smaller both in terms of length and width. Aggressive debridement with a curette afterwards the tissue appears healthier. Left foot wound appears improved in the area on the dorsal right second toe is improved 08/30/16 patient developed a fever over the weekend and was seen in an urgent care. Felt to have a UTI and put on doxycycline. He has been since changed over the phone to [facility]. After we took off the wrap on his right leg today the leg is swollen warm and erythematous, probably more likely the source of the fever 09/06/16; have been using collagen to the major left leg wound, silver alginate to the area on his anterior foot/toes 09/13/16; the areas on his anterior foot/toes on both sides appear to be virtually closed. Extensive wound on the left lateral leg perhaps slightly narrower but each visit still covered an adherent surface slough 09/16/16 patient was in for his usual Thursday nurse visit however the intake nurse noted significant erythema of his dorsal right foot. He is also running a low-grade fever and having increasing spasms in the right leg 09/20/16 here for cellulitis involving his right great toes and forefoot. This is a lot better. Still requiring debridement on his left lateral leg. Santyl direct says he needs prior authorization. Therefore his wife cannot change this at home 09/30/16; the patient's  extensive area on the left lateral calf and ankle perhaps somewhat better. Using Santyl. The area on the left toes is healed and I think the area on his right dorsal foot is healed as well. There is no cellulitis or venous inflammation involving the right leg. He is going to need compression stockings here. 10/07/16; the patient's extensive wound on the left lateral calf and ankle does not measure any differently however there appears to be less adherent surface slough using Santyl and aggressive weekly debridements 10/21/16; no major change in the area on the left lateral calf. Still the same measurement still very difficult to debridement adherent slough and nonviable subcutaneous tissue. This is  not really been helped by several weeks of Santyl. Previously for 2 weeks I used Iodoflex for a short period. A prolonged course of Hydrofera Blue didn't really help. I'm not sure why I only used 2 weeks of Iodoflex on this there is no evidence of surrounding infection. He has a small area on the right second toe which looks as though it's progressing towards closure 10/28/16; the wounds on his toes appear to be closed. No major change in the left lateral leg wound although the surface looks somewhat better using Iodoflex. He has had previous arterial studies that were normal. He has had reflux studies and is status post ablation although I don't have any exact notes on which vein was ablated. I'll need to check the surgical record 11/04/16; he's had a reopening between the first and second toe on the left and right. No major change in the left lateral leg wound. There is what appears to be cellulitis of the left dorsal foot 11/18/16 the patient was hospitalized initially in [place] and then subsequently transferred to [facility] and was admitted there from 11/09/16 through 11/12/16. He had developed progressive cellulitis on the right leg in spite of the doxycycline I gave him. I'd spoken to the  hospitalist in [place] who was concerned about continuing leukocytosis. CT scan is what I suggested this was done which showed soft tissue swelling without evidence of osteomyelitis or an underlying abscess blood cultures were negative. At [facility] he was treated with vancomycin and Primaxin and then add an infectious disease consult. He was transitioned to Ceftaroline. He has been making progressive improvement. Overall a severe cellulitis of the right leg. He is been using silver alginate to her original wound on the left leg. The wounds in his toes on the right are closed there is a small open area on the base of the left second toe 11/26/15; the patient's right leg is much better although there is still some edema here this could be reminiscent from his severe cellulitis likely on top of some degree of lymphedema. His left anterior leg wound has less surface slough as reported by her intake nurse. Small wound at the base of the left second toe 12/02/16; patient's right leg is better and there is no open wound here. His left anterior lateral leg wound continues to have a healthy-looking surface. Small wound at the base of the left second toe however there is erythema in the left forefoot which is worrisome 12/16/16; is no open wounds on his right leg. We took measurements for stockings. His left anterior lateral leg wound continues to have a healthy-looking surface. I'm not sure where we were with the Apligraf run through his insurance. We have been using Iodoflex. He has a thick eschar on the left first second toe interface, I suspect this may be fungal however there is no visible open 12/23/16; no open wound on his right leg. He has 2 small areas left of the linear wound that was remaining last week. We have been using Prisma, I thought I have disclosed this week, we can only look forward to next week 01/03/17; the patient had concerning areas of erythema last week, already on doxycycline for UTI  through his primary doctor. The erythema is absolutely no better there is warmth and swelling both medially from the left lateral leg wound and also the dorsal left foot. 01/06/17- Patient is here for follow-up evaluation of his left lateral leg ulcer and bilateral feet ulcers. He is  on oral antibiotic therapy, tolerating that. Nursing staff and the patient states that the erythema is improved from Monday. 01/13/17; the predominant left lateral leg wound continues to be problematic. I had put Apligraf on him earlier this month once. However he subsequently developed what appeared to be an intense cellulitis around the left lateral leg wound. I gave him Dalvance I think on 2/12 perhaps 2/13 he continues on cefdinir. The erythema is still present but the warmth and swelling is improved. I am hopeful that the cellulitis part of this control. I wouldn't be surprised if there is an element of venous inflammation as well. 01/17/17. The erythema is present but better in the left leg. His left lateral leg wound still does not have a viable surface buttons certain parts of this long thin wound it appears like there has been improvement in dimensions. 01/20/17; the erythema still present but much better in the left leg. I'm thinking this is his usual degree of chronic venous inflammation. The wound on the left leg looks somewhat better. Is less surface slough 01/27/17; erythema is back to the chronic venous inflammation. The wound on the left leg is somewhat better. I am back to the point where I like to try an Apligraf once again 02/10/17; slight improvement in wound dimensions. Apligraf #2. He is completing his doxycycline 02/14/17; patient arrives today having completed doxycycline last Thursday. This was supposed to be a nurse visit however once again he hasn't tense erythema from the medial part of his wound extending over the lower leg. Also erythema in his foot this is roughly in the same distribution as last  time. He has baseline chronic venous inflammation however this is a lot worse than the baseline I have learned to accept the on him is baseline inflammation 02/24/17- patient is here for follow-up evaluation. He is tolerating compression therapy. His voicing no complaints or concerns he is here anticipating an Apligraf 03/03/17; he arrives today with an adherent necrotic surface. I don't think this is surface is going to be amenable for Apligraf's. The erythema around his wound and on the left dorsal foot has resolved he is off antibiotics 03/10/17; better-looking surface today. I don't think he can tolerate Apligraf's. He tells me he had a wound VAC after a skin graft years ago to this area and they had difficulty with a seal. The erythema continues to be stable around this some degree of chronic venous inflammation but he also has recurrent cellulitis. We have been using Iodoflex 03/17/17; continued improvement in the surface and may be small changes in dimensions. Using Iodoflex which seems the only thing that will control his surface 03/24/17- He is here for follow up evaluation of his LLE lateral ulceration and ulcer to right dorsal foot/toe space. He is voicing no complaints or concerns, He is tolerating compression wrap. 03/31/17 arrives today with a much healthier looking wound on the left lower extremity. We have been using Iodoflex for a prolonged period of time which has for the first time prepared and adequate looking wound bed although we have not had much in the way of wound dimension improvement. He also has a small wound between the first and second toe on the right 04/07/17; arrives today with a healthy-looking wound bed and at least the top 50% of this wound appears to be now her. No debridement was required I have changed him to [facility] last week after prolonged Iodoflex. He did not do well with Apligraf's. We've had a  re-opening between the first and second toe on the  right 04/14/17; arrives today with a healthier looking wound bed contractions and the top 50% of this wound and some on the lesser 50%. Wound bed appears healthy. The area between the first and second toe on the right still remains problematic 04/21/17; continued very gradual improvement. Using [facility] 04/28/17; continued very gradual improvement in the left lateral leg venous insufficiency wound. His periwound erythema is very mild. We have been using Hydrofera Blue. Wound is making progress especially in the superior 50% 05/05/17; he continues to have very gradual improvement in the left lateral venous insufficiency wound. Both in terms with an length rings are improving. I debrided this every 2 weeks with #5 curet and we have been using Hydrofera Blue and again making good progress With regards to the wounds between his right first and second toe which I thought might of been tinea pedis he is not making as much progress very dry scaly skin over the area. Also the area at the base of the left first and second toe in a similar condition 05/12/17; continued gradual improvement in the refractory left lateral venous insufficiency wound on the left. Dimension smaller. Surface still requiring debridement using Hydrofera Blue 05/19/17; continued gradual improvement in the refractory left lateral venous ulceration. Careful inspection of the wound bed underlying rumination suggested some degree of epithelialization over the surface no debridement indicated. Continue Hydrofera Blue difficult areas between his toes first and third on the left than first and second on the right. I'm going to change to silver alginate from silver collagen. Continue ketoconazole as I suspect underlying tinea pedis 05/26/17; left lateral leg venous insufficiency wound. We've been using Hydrofera Blue. I believe that there is expanding epithelialization over the surface of the wound albeit not coming from the wound  circumference. This is a bit of an odd situation in which the epithelialization seems to be coming from the surface of the wound rather than in the exact circumference. There is still small open areas mostly along the lateral margin of the wound. ooHe has unchanged areas between the left first and second and the right first second toes which I been treating for tenia pedis 06/02/17; left lateral leg venous insufficiency wound. We have been using Hydrofera Blue. Somewhat smaller from the wound circumference. The surface of the wound remains a bit on it almost epithelialized sedation in appearance. I use an open curette today debridement in the surface of all of this especially the edges ooSmall open wounds remaining on the dorsal right first and second toe interspace and the plantar left first second toe and her face on the left 06/09/17; wound on the left lateral leg continues to be smaller but very gradual and very dry surface using Hydrofera Blue 06/16/17 requires weekly debridements now on the left lateral leg although this continues to contract. I changed to silver collagen last week because of dryness of the wound bed. Using Iodoflex to the areas on his first and second toes/web space bilaterally 06/24/17; patient with history of paraplegia also chronic venous insufficiency with lymphedema. Has a very difficult wound on the left lateral leg. This has been gradually reducing in terms of with but comes in with a very dry adherent surface. High switch to silver collagen a week or so ago with hydrogel to keep the area moist. This is been refractory to multiple dressing attempts. He also has areas in his first and second toes bilaterally in the anterior  and posterior web space. I had been using Iodoflex here after a prolonged course of silver alginate with ketoconazole was ineffective [question tinea pedis] 07/14/17; patient arrives today with a very difficult adherent material over his left lateral  lower leg wound. He also has surrounding erythema and poorly controlled edema. He was switched his Santyl last visit which the nurses are applying once during his doctor visit and once on a nurse visit. He was also reduced to 2 layer compression I'm not exactly sure of the issue here. 07/21/17; better surface today after 1 week of Iodoflex. Significant cellulitis that we treated last week also better. [Doxycycline] 07/28/17 better surface today with now 2 weeks of Iodoflex. Significant cellulitis treated with doxycycline. He has now completed the doxycycline and he is back to his usual degree of chronic venous inflammation/stasis dermatitis. He reminds me he has had ablations surgery here 08/04/17; continued improvement with Iodoflex to the left lateral leg wound in terms of the surface of the wound although the dimensions are better. 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 Problematic areas on the plantar aspect of the first second toe web space on the left and the dorsal aspect of the first second toe web space on the right. At one point I felt these were probably related to chronic fungal infections in treated him aggressively for this although we have not made any improvement here. 08/11/17; left lateral leg. Surface continues to improve with the Iodoflex although we are not seeing much improvement in overall wound dimensions. Areas on his plantar left foot and right foot show no improvement. In fact the right foot looks somewhat worse 08/18/17; left lateral leg. We changed to [facility] Blue last week after a prolonged course of Iodoflex which helps get the surface better. It appears that the wound with is improved. Continue with difficult areas on the left dorsal first second and plantar first second on the right 09/01/17; patient arrives in clinic today having had a temperature of 103 yesterday. He was seen in the ER  and [facility]. The patient was concerned he could have cellulitis again in the right leg however they diagnosed him with a UTI and he is now on Keflex. He has a history of cellulitis which is been recurrent and difficult but this is been in the left leg, in the past 5 use doxycycline. He does in and out catheterizations at home which are risk factors for UTI 09/08/17; patient will be completing his Keflex this weekend. The erythema on the left leg is considerably better. He has a new wound today on the medial part of the right leg small superficial almost looks like a skin tear. He has worsening of the area on the right dorsal first and second toe. His major area on the left lateral leg is better. Using Hydrofera Blue on all areas 09/15/17; gradual reduction in width on the long wound in the left lateral leg. No debridement required. He also has wounds on the plantar aspect of his left first second toe web space and on the dorsal aspect of the right first second toe web space. 09/22/17; there continues to be very gradual improvements in the dimensions of the left lateral leg wound. He hasn't round erythematous spot with might be pressure on his wheelchair. There is no evidence obviously of infection no purulence no warmth ooHe has a dry scaled area on the plantar aspect of the left first second toe   ooImproved area on the dorsal right first second toe. 09/29/17; left lateral leg wound continues to improve in dimensions mostly with an is still a fairly long but increasingly narrow wound. ooHe has a dry scaled area on the plantar aspect of his left first second toe web space ooIncreasingly concerning area on the dorsal right first second toe. In fact I am concerned today about possible cellulitis around this wound. The areas extending up his second toe and although there is deformities here almost appears to abut on the nailbed. 10/06/17; left lateral leg wound continues to make very gradual  progress. Tissue culture I did from the right first second toe dorsal foot last time grew MRSA and enterococcus which was vancomycin sensitive. This was not sensitive to clindamycin or doxycycline. He is allergic to Zyvox and sulfa we have therefore arrange for him to have dalvance infusion tomorrow. He is had this in the past and tolerated it well 10/20/17; left lateral leg wound continues to make decent progress. This is certainly reduced in terms of with there is advancing epithelialization.ooThe cellulitis in the right foot looks better although he still has a deep wound in the dorsal aspect of the first second toe web space. Plantar left first toe web space on the left I think is making some progress 10/27/17; left lateral leg wound continues to make decent progress. Advancing epithelialization.using Hydrofera Blue ooThe right first second toe web space wound is better-looking using silver alginate ooImprovement in the left plantar first second toe web space. Again using silver alginate 11/03/17 left lateral leg wound continues to make decent progress albeit slowly. Using Hydrofera Blue ooThe right per second toe web space continues to be a very problematic looking punched out wound. I obtained a piece of tissue for deep culture I did extensively treated this for fungus. It is difficult to imagine that this is a pressure area as the patient states other than going outside he doesn't really wear shoes at home ooThe left plantar first second toe web space looked fairly senescent. Necrotic edges. This required debridement oochange to Hydrofera Blue to all wound areas 11/10/17; left lateral leg wound continues to contract. Using Hydrofera Blue ooOn the right dorsal first second toe web space dorsally. Culture I did of this area last week grew MRSA there is not an easy oral option in this patient was multiple antibiotic allergies or intolerances. This was only a rare culture isolate I'm  therefore going to use Bactroban under silver alginate ooOn the left plantar first second toe web space. Debridement is required here. This is also unchanged 11/17/17; left lateral leg wound continues to contract using Hydrofera Blue this is no longer the major issue. ooThe major concern here is the right first second toe web space. He now has an open area going from dorsally to the plantar aspect. There is now wound on the inner lateral part of the first toe. Not a very viable surface on this. There is erythema spreading medially into the forefoot. ooNo major change in the left first second toe plantar wound 11/24/17; left lateral leg wound continues to contract using Hydrofera Blue. Nice improvement today ooThe right first second toe web space all of this looks a lot less angry than last week. I have given him clindamycin and topical Bactroban for MRSA and terbinafine for the possibility of underlining tinea pedis that I could not control with ketoconazole. Looks somewhat better ooThe area on the plantar left first second toe web space is weeping  with dried debris around the wound 12/01/17; left lateral leg wound continues to contract he Hydrofera Blue. It is becoming thinner in terms of with nevertheless it is making good improvement. ooThe right first second toe web space looks less angry but still a large necrotic-looking wounds starting on the plantar aspect of the right foot extending between the toes and now extensively on the base of the right second toe. I gave him clindamycin and topical Bactroban for MRSA anterior benefiting for the possibility of underlying tinea pedis. Not looking better today ooThe area on the left first/second toe looks better. Debrided of necrotic debris 12/05/17* the patient was worked in urgently today because over the weekend he found blood on his incontinence bad when he woke up. He was found to have an ulcer by his wife who does most of his wound care. He  came in today for [place] to look at this. He has not had a history of wounds in his buttocks in spite of his paraplegia. 12/08/17; seen in follow-up today at his usual appointment. He was seen earlier this week and found to have a new wound on his buttock. We also follow him for wounds on the left lateral leg, left first second toe web space and right first second toe web space 12/15/17; we have been using Hydrofera Blue to the left lateral leg which has improved. The right first second toe web space has also improved. Left first second toe web space plantar aspect looks stable. The left buttock has worsened using Santyl. Apparently the buttock has drainage 12/22/17; we have been using Hydrofera Blue to the left lateral leg which continues to improve now 2 small wounds separated by normal skin. He tells [place] he had a fever up to 100 yesterday he is prone to UTIs but has not noted anything different. He does in and out catheterizations. The area between the first and second toes today does not look good necrotic surface covered with what looks to be purulent drainage and erythema extending into the third toe. I had gotten this to something that I thought look better last time however it is not look good today. He also has a necrotic surface over the buttock wound which is expanded. I thought there might be infection under here so I removed a lot of the surface with a #5 curet though nothing look like it really needed culturing. He is been using Santyl to this area 12/27/17; his original wound on the left lateral leg continues to improve using Hydrofera Blue. I gave him samples of Baxdella although he was unable to take them out of fear for an allergic reaction ["lump in his throat"].the culture I did of the purulent drainage from his second toe last week showed both enterococcus and a set Enterobacter I was also concerned about the erythema on the bottom of his foot although paradoxically although this looks  somewhat better today. 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 We've been using silver alginate here. They came up today that he sleeps in a recliner, I'm not sure why but I asked him to stop this 01/03/18; his original wound we've been using Hydrofera Blue is now separated into 2 areas. ooUlcer on his left buttock is better he is off the recliner and sleeping in bed ooFinally both wound areas between his first and second toes also looks some better 01/10/18; his original wound on  the left lateral leg is now separated into 2 wounds we've been using Hydrofera Blue ooUlcer on his left buttock has some drainage. There is a small probing site going into muscle layer superiorly.using silver alginate -He arrives today with a deep tissue injury on the left heel ooThe wound on the dorsal aspect of his first second toe on the left looks a lot betterusing silver alginate ketoconazole ooThe area on the first second toe web space on the right also looks a lot bette 01/17/18; his original wound on the left lateral leg continues to progress using Hydrofera Blue ooUlcer on his left buttock also is smaller surface healthier except for a small probing site going into the muscle layer superiorly. 2.4 cm of tunneling in this area ooDTI on his left heel we have only been offloading. Looks better than last week no threatened open no evidence of infection oothe wound on the dorsal aspect of the first second toe on the left continues to look like it's regressing we have only been using silver alginate and terbinafine orally ooThe area in the first second toe web space on the right also looks to be a lot better using silver alginate and terbinafine I think this was prompted by tinea pedis 01/31/18; the patient was hospitalized in [place] last week apparently for a complicated UTI. He was discharged on cefepime he  does in and out catheterizations. In the hospital he was discovered M I don't mild elevation of AST and ALTs and the terbinafine was stopped.predictably the pressure ulcer on his buttock looks betterusing silver alginate. The area on the left lateral leg also is better using Hydrofera Blue. The area between the first and second toes on the left better. First and second toes on the right still substantial but better. Finally the DTI on the left heel has held together and looks like it's resolving 02/07/18-he is here in follow-up evaluation for multiple ulcerations. He has new injury to the lateral aspect of the last issue a pressure ulcer, he states this is from adhesive removal trauma. He states he has tried multiple adhesive products with no success. All other ulcers appear stable. The left heel DTI is resolving. We will continue with same treatment plan and follow-up next week. 02/14/18; follow-up for multiple areas. ooHe has a new area last week on the lateral aspect of his pressure ulcer more over the posterior trochanter. The original pressure ulcer looks quite stable has healthy granulation. 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 Almost fully epithelialized on the original superior area using Hydrofera Blue ooDTI on the left heel has peeled off this week to reveal a small superficial wound under denuded skin and subcutaneous tissue ooBoth areas between the first and second toes look better including nothing open on the left 02/21/18; ooThe patient's wounds on his left ischial tuberosity and posterior left greater trochanter actually looked better. He has a large area of irritation around the area which I think is contact dermatitis. I am doubtful that this is fungal ooHis original wound on the left lateral calf continues to improve we have been using Hydrofera Blue ooThere is no  open area in the left first second toe web space although there is a lot of thick callus ooThe DTI on the left heel required debridement today of necrotic surface eschar and subcutaneous tissue using silver alginate ooFinally the area on the right first second toe  webspace continues to contract using silver alginate and ketoconazole 02/28/18 ooLeft ischial tuberosity wounds look better using silver alginate. ooOriginal wound on the left calf only has one small open area left using Hydrofera Blue ooDTI on the left heel required debridement mostly removing skin from around this wound surface. Using silver alginate ooThe areas on the right first/second toe web space using silver alginate and ketoconazole 03/08/18 on evaluation today patient appears to be doing decently well as best I can tell in regard to his wounds. This is the first time that I have seen him as he generally is followed by Dr. Dellia Nims. With that being said none of his wounds appear to be infected he does have an area where there is some skin covering what appears to be a new wound on the left dorsal surface of his great toe. This is right at the nail bed. With that being said I do believe that debrided away some of the excess skin can be of benefit in this regard. Otherwise he has been tolerating the dressing changes without complication. 03/14/18; patient arrives today with the multiplicity of wounds that we are following. He has not been systemically unwell ooOriginal wound on the left lateral calf now only has 2 small open areas we've been using Hydrofera Blue which should continue ooThe deep tissue injury on the left heel requires debridement today. We've been using silver alginate ooThe left first second toe and the right first second toe are both are reminiscence what I think was tinea pedis. Apparently some of the callus Surface between the toes was removed last week when it started draining. ooPurulent drainage  coming from the wound on the ischial tuberosity on the left. 03/21/18-He is here in follow-up evaluation for multiple wounds. 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 He tolerated debridement. The only change to last week's recommendations is to discontinue antifungal cream between toes. He will follow-up next week 03/28/18; following up for multiple wounds;Concern this week is streaking redness and swelling in the right foot. He is going to need antibiotics for this. 03/31/18; follow-up for right foot cellulitis. Streaking redness and swelling in the right foot on 03/28/18. 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 I put him on clindamycin 300 mg every 6 and brought him in for a quick check. He has an open wound between his first and second toes on the right foot as a potential source. 04/04/18; ooRight foot cellulitis is resolving he is completing clindamycin. This is truly good news ooLeft lateral calf wound which is initial wound only has one small open area inferiorly this is close to healing out. He has compression stockings. We will use Hydrofera Blue right down to the epithelialization of this ooNonviable surface on the left heel which was initially pressure with a DTI. We've been using Hydrofera Blue. I'm going to switch this back to silver alginate ooLeft first second toe/tinea pedis this looks better using silver alginate ooRight first second toe tinea pedis using silver alginate ooLarge pressure ulcers on theLeft ischial tuberosity. Small wound here Looks better. I am uncertain about the surface over the large wound. Using silver alginate 04/11/18; ooCellulitis in the right foot is resolved ooLeft lateral calf wound which was his original wounds still has 2 tiny open areas remaining this is just about closed ooNonviable surface on the left heel is better but still requires  debridement ooLeft first second toe/tinea pedis still open  using silver alginate ooRight first second toe wound tinea pedis I asked him to go back to using ketoconazole and silver alginate ooLarge pressure ulcers on the left ischial tuberosity this shear injury here is resolved. Wound is smaller. No evidence of infection using silver alginate 04/18/18; ooPatient arrives with an intense area of cellulitis in the right mid lower calf extending into the right heel area. Bright red and warm. Smaller area on the left anterior leg. He has a significant history of MRSA. He will definitely need antibioticsoodoxycycline ooHe now has 2 open areas on the left ischial tuberosity the original large wound and now a satellite area which I think was above his initial satellite areas. Not a wonderful surface on this satellite area surrounding erythema which looks like pressure related. ooHis left lateral calf wound again his original wound is just about closed ooLeft heel pressure injury still requiring debridement ooLeft first second toe looks a lot better using silver alginate ooRight first second toe also using silver alginate and ketoconazole cream also looks better 04/20/18; the patient was worked in early today out of concerns with his cellulitis on the right leg. I had started him on doxycycline. This was 2 days ago. His wife was concerned about the swelling in the area. Also concerned about the left buttock. He has not been systemically unwell no fever chills. No nausea vomiting or diarrhea 04/25/18; the patient's left buttock wound is continued to deteriorate he is using Hydrofera Blue. He is still completing clindamycin for the cellulitis on the right leg although all of this looks better. 05/02/18 ooLeft buttock wound still with a lot of drainage and a very tightly adherent fibrinous necrotic surface. He has a deeper area superiorly ooThe left lateral calf wound is still closed ooDTI wound  on the left heel necrotic surface especially the circumference using Iodoflex ooAreas between his left first second toe and right first second toe both look better. Dorsally and the right first second toe he had a necrotic surface although at smaller. In using silver alginate and ketoconazole. I did a culture last week which was a deep tissue culture of the reminiscence of the open wound on the right first second toe dorsally. This grew a few Acinetobacter and a few methicillin-resistant staph aureus. Nevertheless the area actually this week looked better. I didn't feel the need to specifically address this at least in terms of systemic antibiotics. 05/09/18; wounds are measuring larger more drainage per our intake. We are using Santyl covered with alginate on the large superficial buttock wounds, Iodosorb on the left heel, ketoconazole and silver alginate to the dorsal first and second toes bilaterally. 05/16/18; ooThe area on his left buttock better in some aspects although the area superiorly over the ischial tuberosity required an extensive debridement.using Santyl ooLeft heel appears stable. Using Iodoflex ooThe areas between his first and second toes are not bad however there is spreading erythema up the dorsal aspect of his left foot this looks like cellulitis again. He is insensate the erythema is really very brilliant.o Erysipelas He went to see an allergist days ago because he was itching part of this he had lab work done. This showed a white count of 15.1 with 70% neutrophils. Hemoglobin of 11.4 and a platelet count of 659,000. Last white count we had in Epic was a 2-1/2 years ago which was 25.9 but he was ill at the time. He was able to show me some lab work that was done by his primary physician  the pattern is about the same. 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 But prompted me to go ahead and do x-rays of both feet and the pelvis  rule out osteomyelitis. He also had a comprehensive metabolic panel this was reasonably normal his albumin was 3.7 liver function tests BUN/creatinine all normal 05/23/18; x-rays of both his feet from last week were negative for underlying pulmonary abnormality. The x-ray of his pelvis however showed mild irregularity in the left ischial which may represent some early osteomyelitis. The wound in the left ischial continues to get deeper clearly now exposed muscle. Each week necrotic surface material over this area. Whereas the rest of the wounds do not look so bad. ooThe left ischial wound we have been using Santyl and calcium alginate ooTo the left heel surface necrotic debris using Iodoflex ooThe left lateral leg is still healed ooAreas on the left dorsal foot and the right dorsal foot are about the same. There is some inflammation on the left which might represent contact dermatitis, fungal dermatitis I am doubtful cellulitis although this looks better than last week 05/30/18; CT scan done at Hospital did not show any osteomyelitis or abscess. Suggested the possibility of underlying cellulitis although I don't see a lot of evidence of this at the bedside ooThe wound itself on the left buttock/upper thigh actually looks somewhat better. No debridement ooLeft heel also looks better no debridement continue Iodoflex ooBoth dorsal first second toe spaces appear better using Lotrisone. Left still required debridement 06/06/18; ooIntake reported some purulent looking drainage from the left gluteal wound. Using Santyl and calcium alginate ooLeft heel looks better although still a nonviable surface requiring debridement ooThe left dorsal foot first/second webspace actually expanding and somewhat deeper. I may consider doing a shave biopsy of this area ooRight dorsal foot first/second webspace appears stable to improved. Using Lotrisone and silver alginate to both these areas 06/13/18 ooLeft  gluteal surface looks better. Now separated in the 2 wounds. No debridement required. Still drainage. We'll continue silver alginate ooLeft heel continues to look better with Iodoflex continue this for at least another week ooOf his dorsal foot wounds the area on the left still has some depth although it looks better than last week. We've been using Lotrisone and silver alginate 06/20/18 ooLeft gluteal continues to look better healthy tissue ooLeft heel continues to look better healthy granulation wound is smaller. He is using Iodoflex and his long as this continues continue the Iodoflex ooDorsal right foot looks better unfortunately dorsal left foot does not. There is swelling and erythema of his forefoot. He had minor trauma to this several days ago but doesn't think this was enough to have caused any tissue injury. Foot looks like cellulitis, we have had this problem before 06/27/18 on evaluation today patient appears to be doing a little worse in regard to his foot ulcer. 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 Both of which would really be his only options for treating this infection. In the past he has been given and effusion of Orbactiv. This is done very well for him in the past again it's one time dosing IV antibiotic therapy. Subsequently I do believe this is something we're gonna need to see about doing at this point in time. Currently his other wounds seem to be doing somewhat  better in my pinion I'm pretty happy in that regard. 07/03/18 on evaluation today patient's wounds actually appear to be doing fairly well. He has been tolerating the dressing changes without complication. All in all he seems to be showing signs of improvement. In regard to the antibiotics he has been dealing with infectious disease since I saw him last week as far as  getting this scheduled. In the end [facility]'s [facility] this coming Friday. In the meantime he has been continuing to perform the dressing changes in such as previous. There does not appear to be any evidence of infection worsengin at this time. 07/10/18; ooSince I last saw this man 2 weeks ago things have actually improved. IV antibiotics of resulted in less forefoot erythema although there is still some present. He is not systemically unwell ooLeft buttock wounds o2 now have no depth there is increased epithelialization Using silver alginate ooLeft heel still requires debridement using Iodoflex ooLeft dorsal foot still with a sizable wound about the size of a border but healthy granulation ooRight dorsal foot still with a slitlike area using silver alginate 07/18/18; the patient's cellulitis in the left foot is improved in fact I think it is on its way to resolving. ooLeft buttock wounds o2 both look better although the larger one has hypertension granulation we've been using silver alginate ooLeft heel has some thick circumferential redundant skin over the wound edge which will need to be removed today we've been using Iodoflex ooLeft dorsal foot is still a sizable wound required debridement using silver alginate ooThe right dorsal foot is just about closed only a small open area remains here 07/25/18; left foot cellulitis is resolved ooLeft buttock wounds o2 both look better. Hyper-granulation on the major area ooLeft heel as some debris over the surface but otherwise looks a healthier wound. Using silver collagen ooRight dorsal foot is just about closed 07/31/18; arrives with our intake nurse worried about purulent drainage from the buttock. We had hyper-granulation here last week ooHis buttock wounds o2 continue to look better ooLeft heel some debris over the surface but measuring smaller. ooRight dorsal foot  unfortunately has openings between the toes ooLeft foot superficial wound looks less aggravated. 08/07/18 ooButtock wounds continue to look better although some of her granulation and the larger medial wound. silver alginate ooLeft heel continues to look a lot better.silver collagen ooLeft foot superficial wound looks less stable. Requires debridement. He has a new wound superficial area on the foot on the lateral dorsal foot. ooRight foot looks better using silver alginate without Lotrisone 08/14/2018; patient was in the ER last week diagnosed with a UTI. He is now on Cefpodoxime and Macrodantin. ooButtock wounds continued to be smaller. Using silver alginate ooLeft heel continues to look better using silver collagen ooLeft foot superficial wound looks as though it is improving ooRight dorsal foot area is just about healed. 08/21/2018; patient is completed his antibiotics for his UTI. ooHe has 2 open areas on the buttocks. There is still not closed although the surface looks satisfactory. Using silver alginate ooLeft heel continues to improve using silver collagen ooThe bilateral dorsal foot areas which are at the base of his first and second toes/possible tinea pedis are actually stable on the left but worse on the right. The area on the left required debridement of necrotic surface. After debridement I obtained a specimen for PCR culture. ooThe right dorsal foot which is been just about  healed last week is now reopened 08/28/2018; culture done on the left dorsal foot showed coag negative staph both staph epidermidis and Lugdunensis. I think this is worthwhile initiating systemic treatment. I will use doxycycline given his long list of allergies. The area on the left heel slightly improved but still requiring debridement. ooThe large wound on the buttock is just about closed whereas the smaller one is larger. Using silver alginate in this area 09/04/2018; patient is completing  his doxycycline for the left foot although this continues to be a very difficult wound area with very adherent necrotic debris. We are using silver alginate to all his wounds right foot left foot and the small wounds on his buttock, silver collagen on the left heel. 09/11/2018; once again this patient has intense erythema and swelling of the left forefoot. Lesser degrees of erythema in the right foot. He has a long list of allergies and intolerances. I will reinstitute doxycycline. 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 Using silver alginate ooLeft heel also looks better using silver collagen ooUnfortunately both the areas on his feet look worse. The area on the left first second webspace is now gone through to the plantar part of his foot. The area on the left foot anteriorly is irritated with erythema and swelling in the forefoot. 09/25/2018 ooHis wound on the left plantar heel looks better. Using silver collagen ooThe area on the left buttock 2 small remnant areas. One is closed one is still open. Using silver alginate ooThe areas between both his first and second toes look worse. This in spite of long-standing antifungal therapy with ketoconazole and silver alginate which should have antifungal activity ooHe has small areas around his original wound on the left calf one is on the bottom of the original scar tissue and one superiorly both of these are small and superficial but again given wound history in this site this is worrisome 10/02/2018 ooLeft plantar heel continues to gradually contract using silver collagen ooLeft buttock wound is unchanged using silver alginate ooThe areas on his dorsal feet between his first and second toes bilaterally look about the same. I prescribed clindamycin ointment to see if we can address chronic staph colonization and also the underlying possibility of erythrasma ooThe left lateral lower  extremity wound is actually on the lateral part of his ankle. Small open area here. We have been using silver alginate 10/09/2018; ooLeft plantar heel continues to look healthy and contract. No debridement is required ooLeft buttock slightly smaller with a tape injury wound just below which was new this week ooDorsal feet somewhat improved I have been using clindamycin ooLeft lateral looks lower extremity the actual open area looks worse although a lot of this is epithelialized. I am going to change to silver collagen today He has a lot more swelling in the right leg although this is not pitting not red and not particularly warm there is a lot of spasm in the right leg usually indicative of people with paralysis of some underlying discomfort. We have reviewed his vascular status from 2017 he had a left greater saphenous vein ablation. I wonder about referring him back to vascular surgery if the area on the left leg continues to deteriorate. 10/16/2018 in today for follow-up and management of multiple lower extremity ulcers. His left Buttock wound is much lower smaller and almost closed completely. The wound to the left ankle has began to reopen with Epithelialization and  some adherent slough. He has multiple new areas to the left foot and leg. The left dorsal foot without much improvement. Wound present between left great webspace and 2nd toe. Erythema and edema present right leg. Right LE ultrasound obtained on 10/10/18 was negative for DVT. 10/23/2018; ooLeft buttock is closed over. Still dry macerated skin but there is no open wound. I suspect this is chronic pressure/moisture ooLeft lateral calf is quite a bit worse than when I saw this last. There is clearly drainage here he has macerated skin into the left plantar heel. We will change the primary dressing to alginate ooLeft dorsal foot has some improvement in overall wound area. Still using clindamycin and silver alginate ooRight  dorsal foot about the same as the left using clindamycin and silver alginate ooThe erythema in the right leg has resolved. He is DVT rule out was negative ooLeft heel pressure area required debridement although the wound is smaller and the surface is health 10/26/2018 ooThe patient came back in for his nurse check today predominantly because of the drainage coming out of the left lateral leg with a recent reopening of his original wound on the left lateral calf. He comes in today with a large amount of surrounding erythema around the wound extending from the calf into the ankle and even in the area on the dorsal foot. He is not systemically unwell. He is not febrile. Nevertheless this looks like cellulitis. We have been using silver alginate to the area. I changed him to a regular visit and I am going to prescribe him doxycycline. The rationale here is a long list of medication intolerances and a history of MRSA. I did not see anything that I thought would provide a valuable culture 10/30/2018 ooFollow-up from his appointment 4 days ago with really an extensive area of cellulitis in the left calf left lateral ankle and left dorsal foot. I put him on doxycycline. He has a long list of medication allergies which are true allergy reactions. 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 In any case he is a lot better today. The erythema especially in the anterior and lateral left calf is better. He still has left ankle erythema. He also is complaining about increasing edema in the right leg we have only been using Kerlix Coban and he has been doing the wraps at home. Finally he has a spotty rash on the medial part of his upper left calf which looks like folliculitis or perhaps wrap occlusion type injury. Small superficial macules not pustules 11/06/18 patient arrives today with again a considerable degree of erythema around the wound  on the left lateral calf extending into the dorsal ankle and dorsal foot. This is a lot worse than when I saw this last week. He is on doxycycline really with not a lot of improvement. He has not been systemically unwell Wounds on the; left heel actually looks improved. Original area on the left foot and proximity to the first and second toes looks about the same. He has superficial areas on the dorsal foot, anterior calf and then the reopening of his original wound on the left lateral calf which looks about the same ooThe only area he has on the right is the dorsal webspace first and second which is smaller. ooHe has a large area of dry erythematous skin on the left buttock small open area here. 11/13/2018; the patient arrives in much better condition. The erythema  around the wound on the left lateral calf is a lot better. Not sure whether this was the clindamycin or the TCA and ketoconazole or just in the improvement in edema control [stasis dermatitis]. In any case this is a lot better. The area on the left heel is very small and just about resolved using silver collagen we have been using silver alginate to the areas on his dorsal feet 11/20/2018; his wounds include the left lateral calf, left heel, dorsal aspects of both feet just proximal to the first second webspace. He is stable to slightly improved. I did not think any changes to his dressings were going to be necessary 11/27/2018 he has a reopening on the left buttock which is surrounded by what looks like tinea or perhaps some other form of dermatitis. The area on the left dorsal foot has some erythema around it I have marked this area but I am not sure whether this is cellulitis or not. Left heel is not closed. Left calf the reopening is really slightly longer and probably worse 1/13; in general things look better and smaller except for the left dorsal foot. Area on the left heel is just about closed, left buttock looks better only a  small wound remains in the skin looks better [using Lotrisone] 1/20; the area on the left heel only has a few remaining open areas here. Left lateral calf about the same in terms of size, left dorsal foot slightly larger right lateral foot still not closed. The area on the left buttock has no open wound and the surrounding skin looks a lot better 1/27; the area on the left heel is closed. Left lateral calf better but still requiring extensive debridements. The area on his left buttock is closed. He still has the open areas on the left dorsal foot which is slightly smaller in the right foot which is slightly expanded. We have been using Iodoflex on these areas as well 2/3; left heel is closed. Left lateral calf still requiring debridement using Iodoflex there is no open area on his left buttock however he has dry scaly skin over a large area of this. Not really responding well to the Lotrisone. Finally the areas on his dorsal feet at the level of the first second webspace are slightly smaller on the right and about the same on the left. Both of these vigorously debrided with Anasept and gauze 2/10; left heel remains closed he has dry erythematous skin over the left buttock but there is no open wound here. Left lateral leg has come in and with. Still requiring debridement we have been using Iodoflex here. Finally the area on the left dorsal foot and right dorsal foot are really about the same extremely dry callused fissured areas. He does not yet have a dermatology appointment 2/17; left heel remains closed. He has a new open area on the left buttock. The area on the left lateral calf is bigger longer and still covered in necrotic debris. No major change in his foot areas bilaterally. I am awaiting for a dermatologist to look on this. We have been using ketoconazole I do not know that this is been doing any good at all. 2/24; left heel remains closed. The left buttock wound that was new reopening  last week looks better. The left lateral calf appears better also although still requires debridement. The major area on his foot is the left first second also requiring debridement. We have been putting Prisma on all wounds. I do  not believe that the ketoconazole has done too much good for his feet. He will use Lotrisone I am going to give him a 2-week course of terbinafine. We still do not have a dermatology appointment 3/2 left heel remains closed however there is skin over bone in this area I pointed this out to him today. The left buttock wound is epithelialized but still does not look completely stable. The area on the left leg required debridement were using silver collagen here. With regards to his feet we changed to Lotrisone last week and silver alginate. 3/9; left heel remains closed. Left buttock remains closed. The area on the right foot is essentially closed. The left foot remains unchanged. Slightly smaller on the left lateral calf. Using silver collagen to both of these areas 3/16-Left heel remains closed. Area on right foot is closed. Left lateral calf above the lateral malleolus open wound requiring debridement with easy bleeding. Left dorsal wound proximal to first toe also debrided. Left ischial area open new. Patient has been using Prisma with wrapping every 3 days. Dermatology appointment is apparently tomorrow.Patient has completed his terbinafine 2-week course with some apparent improvement according to him, there is still flaking and dry skin in his foot on the left 3/23; area on the right foot is reopened. The area on the left anterior foot is about the same still a very necrotic adherent surface. He still has the area on the left leg and reopening is on the left buttock. He apparently saw dermatology although I do not have a note. According to the patient who is usually fairly well informed they did not have any good ideas. Put him on oral terbinafine which she is been on  before. 3/30; using silver collagen to all wounds. Apparently his dermatologist put him on doxycycline and rifampin presumably some culture grew staph. I do not have this result. He remains on terbinafine although I have used terbinafine on him before 4/6; patient has had a fairly substantial reopening on the right foot between the first and second toes. He is finished his terbinafine and I believe is on doxycycline and rifampin still as prescribed by dermatology. We have been using silver collagen to all his wounds although the patient reports that he thinks silver alginate does better on the wounds on his buttock. 4/13; the area on his left lateral calf about the same size but it did not require debridement. ooLeft dorsal foot just proximal to the webspace between the first and second toes is about the same. Still nonviable surface. I note some superficial bronze discoloration of the dorsal part of his foot ooRight dorsal foot just proximal to the first and second toes also looks about the same. I still think there may be the same discoloration I noted above on the left ooLeft buttock wound looks about the same 4/20; left lateral calf appears to be gradually contracting using silver collagen. 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 The left dorsal foot wound is debrided of tightly adherent necrotic debris and really cleans up quite nicely. The right area is worse with expansion. I did not debride this it is now over the base of the second toe ooThe area on his left buttock is smaller no debridement is required using silver collagen 5/4; left calf continues to make good progress. ooHe arrives with erythema around the wounds on his dorsal foot which even extends to the plantar aspect. Very concerning for coexistent infection. He is  finished the erythromycin I gave him for possible erythrasma this does not seem to have  helped. ooThe area on the left foot is about the same base of the dorsal toes ooIs area on the buttock looks improved on the left 5/11; left calf and left buttock continued to make good progress. Left foot is about the same to slightly improved. ooMajor problem is on the right foot. He has not had an x-ray. Deep tissue culture I did last week showed both Enterobacter and E. coli. I did not change the doxycycline I put him on empirically although neither 1 of these were plated to doxycycline. He arrives today with the erythema looking worse on both the dorsal and plantar foot. Macerated skin on the bottom of the foot. he has not been systemically unwell 5/18-Patient returns at 1 week, left calf wound appears to be making some progress, left buttock wound appears slightly worse than last time, left foot wound looks slightly better, right foot redness is marginally better. X-ray of both feet show no air or evidence of osteomyelitis. Patient is finished his Omnicef and terbinafine. He continues to have macerated skin on the bottom of the left foot as well as right 5/26; left calf wound is better, left buttock wound appears to have multiple small superficial open areas with surrounding macerated skin. X-rays that I did last time showed no evidence of osteomyelitis in either foot. He is finished cefdinir and doxycycline. I do not think that he was on terbinafine. He continues to have a large superficial open area on the right foot anterior dorsal and slightly between the first and second toes. I did send him to dermatology 2 months ago or so wondering about whether they would do a fungal scraping. I do not believe they did but did do a culture. We have been using silver alginate to the toe areas, he has been using antifungals at home topically either ketoconazole or Lotrisone. We are using silver collagen on the left foot, silver alginate on the right, silver collagen on the left lateral leg and  silver alginate on the left buttock 6/1; left buttock area is healed. We have the left dorsal foot, left lateral leg and right dorsal foot. We are using silver alginate to the areas on both feet and silver collagen to the area on his left lateral calf 6/8; the left buttock apparently reopened late last week. He is not really sure how this happened. He is tolerating the terbinafine. Using silver alginate to all wounds 6/15; left buttock wound is larger than last week but still superficial. ooCame in the clinic today with a report of purulence from the left lateral leg I did not identify any infection ooBoth areas on his dorsal feet appear to be better. He is tolerating the terbinafine. Using silver alginate to all wounds 6/22; left buttock is about the same this week, left calf quite a bit better. His left foot is about the same however he comes in with erythema and warmth in the right forefoot once again. Culture that I gave him in the beginning of May showed Enterobacter and E. coli. I gave him doxycycline and things seem to improve although neither 1 of these organisms was specifically plated. 6/29; left buttock is larger and dry this week. Left lateral calf looks to me to be improved. Left dorsal foot also somewhat improved right foot completely unchanged. The erythema on the right foot is still present. He is completing the Ceftin dinner that I gave  him empirically [see discussion above.) 7/6 - All wounds look to be stable and perhaps improved, the left buttock wound is slightly smaller, per patient bleeds easily, completed ceftin, the right foot redness is less, he is on terbinafine 7/13; left buttock wound about the same perhaps slightly narrower. Area on the left lateral leg continues to narrow. Left dorsal foot slightly smaller right foot about the same. We are using silver alginate on the right foot and Hydrofera Blue to the areas on the left. Unna boot on the left 2 layer compression  on the right 7/20; left buttock wound absolutely the same. Area on lateral leg continues to get better. Left dorsal foot require debridement as did the right no major change in the 7/27; left buttock wound the same size necrotic debris over the surface. The area on the lateral leg is closed once again. His left foot looks better right foot about the same although there is some involvement now of the posterior first second toe area. He is still on terbinafine which I have given him for a month, not certain a centimeter major change 06/25/19-All wounds appear to be slightly improved according to report, left buttock wound looks clean, both foot wounds have minimal to no debris the right dorsal foot has minimal slough. We are using Hydrofera Blue to the left and silver alginate to the right foot and ischial wound. 8/10-Wounds all appear to be around the same, the right forefoot distal part has some redness which was not there before, however the wound looks clean and small. Ischial wound looks about the same with no changes 8/17; his wound on the left lateral calf which was his original chronic venous insufficiency wound remains closed. Since I last saw him the areas on the left dorsal foot right dorsal foot generally appear better but require debridement. The area on his left initial tuberosity appears somewhat larger to me perhaps hyper granulated and bleeds very easily. We have been using Hydrofera Blue to the left dorsal foot and silver alginate to everything else 8/24; left lateral calf remains closed. The areas on his dorsal feet on the webspace of the first and second toes bilaterally both look better. The area on the left buttock which is the pressure ulcer stage II slightly smaller. I change the dressing to Hydrofera Blue to all areas 8/31; left lateral calf remains closed. The area on his dorsal feet bilaterally look better. Using Hydrofera Blue. Still requiring debridement on the left foot.  No change in the left buttock pressure ulcers however 9/14; left lateral calf remains closed. Dorsal feet look quite a bit better than 2 weeks ago. Flaking dry skin also a lot better with the ammonium lactate I gave him 2 weeks ago. The area on the left buttock is improved. He states that his Roho cushion developed a leak and he is getting a new one, in the interim he is offloading this vigorously 9/21; left calf remains closed. Left heel which was a possible DTI looks better this week. He had macerated tissue around the left dorsal foot right foot looks satisfactory and improved left buttock wound. I changed his dressings to his feet to silver alginate bilaterally. Continuing Hydrofera Blue on the left buttock. 9/28 left calf remains closed. Left heel did not develop anything [possible DTI] dry flaking skin on the left dorsal foot. Right foot looks satisfactory. Improved left buttock wound. We are using silver alginate on his feet Hydrofera Blue on the buttock. I have asked him  to go back to the Lotrisone on his feet including the wounds and surrounding areas 10/5; left calf remains closed. The areas on the left and right feet about the same. A lot of this is epithelialized however debris over the remaining open areas. He is using Lotrisone and silver alginate. The area on the left buttock using Hydrofera Blue 10/26. Patient has been out for 3 weeks secondary to Covid concerns. He tested negative but I think his wife tested positive. He comes in today with the left foot substantially worse, right foot about the same. Even more concerning he states that the area on his left buttock closed over but then reopened and is considerably deeper in one aspect than it was before [stage III wound] 11/2; left foot really about the same as last week. Quarter sized wound on the dorsal foot just proximal to the first second toes. Surrounding erythema with areas of denuded epithelium. This is not really much  different looking. Did not look like cellulitis this time however. ooRight foot area about the same.. We have been using silver alginate alginate on his toes ooLeft buttock still substantial irritated skin around the wound which I think looks somewhat better. We have been using Hydrofera Blue here. 11/9; left foot larger than last week and a very necrotic surface. Right foot I think is about the same perhaps slightly smaller. Debris around the circumference also addressed. Unfortunately on the left buttock there is been a decline. Satellite lesions below the major wound distally and now a an additional one posteriorly we have been using Hydrofera Blue but I think this is a pressure issue 11/16; left foot ulcer dorsally again a very adherent necrotic surface. Right foot is about the same. Not much change in the pressure ulcer on his left buttock. 11/30; left foot ulcer dorsally basically the same as when I saw him 2 weeks ago. Very adherent fibrinous debris on the wound surface. Patient reports a lot of drainage as well. 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 We have been using Iodoflex, patient changed back to alginate because of the drainage. Area on his right dorsal foot really looks benign with a healthier surface certainly a lot better than on the left. Left buttock wounds all improved using Hydrofera Blue 12/7; left dorsal foot again no improvement. Tightly adherent debris. PCR culture I did last week only showed likely skin contaminant. I have gone ahead and done a punch biopsy of this which is about the last thing in terms of investigations I can think to do. He has known venous insufficiency and venous hypertension and this could be the issue here. The area on the right foot is about the same left buttock slightly worse according to our intake nurse secondary to [facility] Blue sticking to the wound 12/14; biopsy of the left foot that  I did last time showed changes that could be related to wound healing/chronic stasis dermatitis phenomenon no neoplasm. We have been using silver alginate to both feet. I change the one on the left today to Sorbact and silver alginate to his other 2 wounds 12/28; the patient arrives with the following problems; ooMajor issue is the dorsal left foot which continues to be a larger deeper wound area. Still with a completely nonviable surface ooParadoxically the area mirror image on the right on the right dorsal foot appears to be getting better. ooHe had some loss of dry denuded skin from the lower part of  his original wound on the left lateral calf. Some of this area looked a little vulnerable and for this reason we put him in wrap that on this side this week ooThe area on his left buttock is larger. He still has the erythematous circular area which I think is a combination of pressure, sweat. This does not look like cellulitis or fungal dermatitis 11/26/2019; -Dorsal left foot large open wound with depth. Still debris over the surface. Using Sorbact ooThe area on the dorsal right foot paradoxically has closed over [facility] has a reopening on the left ankle laterally at the base of his original wound that extended up into the calf. This appears clean. ooThe left buttock wound is smaller but with very adherent necrotic debris over the surface. We have been using silver alginate here as well The patient had arterial studies done in 2017. He had biphasic waveforms at the dorsalis pedis and posterior tibial bilaterally. ABI in the left was 1.17. Digit waveforms were dampened. He has slight spasticity in the great toes I do not think a TBI would be possible 1/11; the patient comes in today with a sizable reopening between the first and second toes on the right. This is not exactly in the same location where we have been treating wounds previously. According to our intake nurse this was actually  fairly deep but 0.6 cm. The area on the left dorsal foot looks about the same the surface is somewhat cleaner using Sorbact, his MRI is in 2 days. We have not managed yet to get arterial studies. The new reopening on the left lateral calf looks somewhat better using alginate. The left buttock wound is about the same using alginate 1/18; the patient had his ARTERIAL studies which were quite normal. ABI in the right at 1.13 with triphasic/biphasic waveforms on the left ABI 1.06 again with triphasic/biphasic waveforms. It would not have been possible to have done a toe brachial index because of spasticity. We have been using Sorbac to the left foot alginate to the rest of his wounds on the right foot left lateral calf and left buttock 1/25; arrives in clinic with erythema and swelling of the left forefoot worse over the first MTP area. This extends laterally dorsally and but also posteriorly. Still has an area on the left lateral part of the lower part of his calf wound it is eschared and clearly not closed. ooArea on the left buttock still with surrounding irritation and erythema. ooRight foot surface wound dorsally. The area between the right and first and second toes appears better. 2/1; ooThe left foot wound is about the same. Erythema slightly better I gave him a week of doxycycline empirically ooRight foot wound is more extensive extending between the toes to the plantar surface ooLeft lateral calf really no open surface on the inferior part of his original wound however the entire area still looks vulnerable ooAbsolutely no improvement in the left buttock wound required debridement. 2/8; the left foot is about the same. Erythema is slightly improved I gave him clindamycin last week. ooRight foot looks better he is using Lotrimin and silver alginate ooHe has a breakdown in the left lateral calf. Denuded epithelium which I have removed ooLeft buttock about the same were using  Hydrofera Blue 2/15; left foot is about the same there is less surrounding erythema. Surface still has tightly adherent debris which I have debriding however not making any progress ooRight foot has a substantial wound on the medial right second toe between  the first and second webspace. ooStill an open area on the left lateral calf distal area. ooButtock wound is about the same 2/22; left foot is about the same less surrounding erythema. Surface has adherent debris. Polymen Ag Right foot area significant wound between the first and second toes. We have been using silver alginate here Left lateral leg polymen Ag at the base of his original venous insufficiency wound ooLeft buttock some improvement here 3/1; ooRight foot is deteriorating in the first second toe webspace. Larger and more substantial. We have been using silver alginate. ooLeft dorsal foot about the same markedly adherent surface debris using PolyMem Ag ooLeft lateral calf surface debris using PolyMem AG ooLeft buttock is improved again using PolyMem Ag. ooHe is completing his terbinafine. The erythema in the foot seems better. He has been on this for 2 weeks 3/8; no improvement in any wound area in fact he has a small open area on the dorsal midfoot which is new this week. He has not gotten his foot x-rays yet 3/15; his x-rays were both negative for osteomyelitis of both feet. No major change in any of his wounds on the extremities however his buttock wounds are better. We have been using polymen on the buttocks, left lower leg. Iodoflex on the left foot and silver alginate on the right 3/22; arrives in clinic today with the 2 major issues are the improvement in the left dorsal foot wound which for once actually looks healthy with a nice healthy wound surface without debridement. Using Iodoflex here. Unfortunately on the left lateral calf which is in the distal part of his original wound he came to the clinic here for  there was purulent drainage noted some increased breakdown scattered around the original area and a small area proximally. We we are using polymen here will change to silver alginate today. His buttock wound on the left is better and I think the area on the right first second toe webspace is also improved 3/29; left dorsal foot looks better. Using Iodoflex. Left ankle culture from deterioration last time grew E. coli, Enterobacter and Enterococcus. I will give him a course of cefdinir although that will not cover Enterococcus. The area on the right foot in the webspace of the first and second toe lateral first toe looks better. The area on his buttock is about healed Vascular appointment is on April 21. This is to look at his venous system vis--vis continued breakdown of the wounds on the left including the left lateral leg and left dorsal foot he. He has had previous ablations on this side 4/5; the area between the right first and second toes lateral aspect of the first toe looks better. Dorsal aspect of the left first toe on the left foot also improved. Unfortunately the left lateral lower leg is larger and there is a second satellite wound superiorly. The usual superficial abrasions on the left buttock overall better but certainly not closed 4/12; the area between the right first and second toes is improved. Dorsal aspect of the left foot also slightly smaller with a vibrant healthy looking surface. No real change in the left lateral leg and the left buttock wound is healed He has an unaffordable co-pay for Apligraf. Appointment with vein and vascular with regards to the left leg venous part of the circulation is on 4/21 4/19; we continue to see improvement in all wound areas. Although this is minor. He has his vascular appointment on 4/21. The area on the left buttock  has not reopened although right in the center of this area the skin looks somewhat threatened 4/26; the left buttock is  unfortunately reopened. In general his left dorsal foot has a healthy surface and looks somewhat smaller although it was not measured as such. The area between his first and second toe webspace on the right as a small wound against the first toe. The patient saw vascular surgery. The real question I was asking was about the small saphenous vein on the left. He has previously ablated left greater saphenous vein. Nothing further was commented on on the left. Right greater saphenous vein without reflux at the saphenofemoral junction or proximal thigh there was no indication for ablation of the right greater saphenous vein duplex was negative for DVT bilaterally. They did not think there was anything from a vascular surgery point of view that could be offered. They ABIs within normal limits Objective Constitutional Sitting or standing Blood Pressure is within target range for patient.. Pulse regular and within target range for patient.Marland Kitchen Respirations regular, non-labored and within target range.. Temperature is normal and within the target range for the patient.Marland Kitchen Appears in no distress. Vitals Time Taken: 7:47 AM, Height: 70 in, Weight: 216 lbs, BMI: 31, Temperature: 98.3 F, Pulse: 112 bpm, Respiratory Rate: 18 breaths/min, Blood Pressure: 123/71 mmHg. General Notes: Wound exam ooLeft foot looks smaller to me still with the same healthy granulation. Debrided with wound cleanser and gauze no mechanical debridement ooRight foot between the first and second toes lateral aspect of the first toe predominantly. Small open area remains I debrided this with #3 curette difficult due to spasticity ooLeft lateral lower leg looks about the same [facility] has a new superficial opening on the left buttock Integumentary (Hair, Skin) Wound #24 status is Open. Original cause of wound was Gradually Appeared. The wound is located on the Left,Dorsal Foot. The wound measures 1.6cm length x 2.1cm width x 0.3cm depth;  2.639cm^2 area and 0.792cm^3 volume. There is Fat Layer (Subcutaneous Tissue) Exposed exposed. There is no tunneling or undermining noted. There is a medium amount of serosanguineous drainage noted. The wound margin is flat and intact. There is large (67-100%) red granulation within the wound bed. There is a small (1-33%) amount of necrotic tissue within the wound bed including Adherent Slough. Wound #37 status is Open. Original cause of wound was Gradually Appeared. The wound is located on the Left,Lateral Malleolus. The wound measures 3.5cm length x 1.4cm width x 0.1cm depth; 3.848cm^2 area and 0.385cm^3 volume. There is Fat Layer (Subcutaneous Tissue) Exposed exposed. There is no tunneling or undermining noted. There is a medium amount of serosanguineous drainage noted. The wound margin is flat and intact. There is large (67-100%) red granulation within the wound bed. There is no necrotic tissue within the wound bed. Wound #38 status is Open. Original cause of wound was Gradually Appeared. The wound is located on the Right Toe - Web between 1st and 2nd. The wound measures 0.8cm length x 0.1cm width x 0.1cm depth; 0.063cm^2 area and 0.006cm^3 volume. There is Fat Layer (Subcutaneous Tissue) Exposed exposed. There is no tunneling or undermining noted. There is a small amount of serosanguineous drainage noted. The wound margin is flat and intact. There is large (67-100%) red granulation within the wound bed. There is no necrotic tissue within the wound bed. Wound #40 status is Open. Original cause of wound was Gradually Appeared. The wound is located on the Left,Lateral Lower Leg. The wound measures 0.3cm length x 0.1cm  width x 0.1cm depth; 0.024cm^2 area and 0.002cm^3 volume. There is Fat Layer (Subcutaneous Tissue) Exposed exposed. There is no tunneling or undermining noted. There is a small amount of serosanguineous drainage noted. The wound margin is flat and intact. There is large (67-100%)  pink granulation within the wound bed. There is no necrotic tissue within the wound bed. Wound #41 status is Open. Original cause of wound was Gradually Appeared. The wound is located on the Left Ischium. The wound measures 1cm length x 2.3cm width x 0.1cm depth; 1.806cm^2 area and 0.181cm^3 volume. There is Fat Layer (Subcutaneous Tissue) Exposed exposed. There is no tunneling or undermining noted. There is a medium amount of serosanguineous drainage noted. The wound margin is flat and intact. There is large (67-100%) red granulation within the wound bed. There is no necrotic tissue within the wound bed. 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 There was a Excisional Skin/Subcutaneous Tissue Debridement with a total area of 0.08 sq cm performed by Ricard Dillon., MD. With the following instrument(s): Curette to remove Viable and Non-Viable tissue/material. Material removed includes Subcutaneous Tissue and Slough and. No specimens were taken. A time out was conducted at 08:07, prior to the start of the procedure. A Minimum amount of bleeding was controlled with Pressure. The procedure was tolerated well with a pain level of 0 throughout and a pain level of 0 following the procedure. Post Debridement Measurements: 0.8cm length x 0.1cm width x 0.1cm depth; 0.006cm^3 volume. Character of Wound/Ulcer Post Debridement is improved. Post procedure Diagnosis Wound #38: Same as  Pre-Procedure Wound #37 Pre-procedure diagnosis of Wound #37 is a Venous Leg Ulcer located on the Left,Lateral Malleolus . There was a Four Layer Compression Therapy Procedure by Levan Hurst, RN. Post procedure Diagnosis Wound #37: Same as Pre-Procedure Wound #40 Pre-procedure diagnosis of Wound #40 is a Venous Leg Ulcer located on the Left,Lateral Lower Leg . There was a Four Layer Compression Therapy Procedure by Levan Hurst, RN. Post procedure Diagnosis Wound #40: Same as Pre-Procedure Plan Follow-up Appointments: Return Appointment in 1 week. Dressing Change Frequency: Wound #37 Left,Lateral Malleolus: Do not change entire dressing for one week. Wound #38 Right Toe - Web between 1st and 2nd: Change Dressing every other day. Wound #41 Left Ischium: Change Dressing every other day. Wound #40 Left,Lateral Lower Leg: Do not change entire dressing for one week. Skin Barriers/Peri-Wound Care: Antifungal cream - on toes on both feet daily Barrier cream - to leg/ankle and left foot Moisturizing lotion - both legs Other: - Triamcinolone cream Primary Wound Dressing: Wound #24 Left,Dorsal Foot: Iodoflex Wound #37 Left,Lateral Malleolus: Hydrofera Blue Wound #38 Right Toe - Web between 1st and 2nd: Calcium Alginate with Silver Wound #40 Left,Lateral Lower Leg: Calcium Alginate with Silver Wound #41 Left Ischium: Calcium Alginate with Silver Secondary Dressing: Wound #24 Left,Dorsal Foot: Dry Gauze ABD pad Wound #37 Left,Lateral Malleolus: Dry Gauze ABD pad Wound #38 Right Toe - Web between 1st and 2nd: Kerlix/Rolled Gauze - secure with tape Dry Gauze Wound #40 Left,Lateral  Lower Leg: Dry Gauze ABD pad Wound #41 Left Ischium: Dry Gauze ABD pad Edema Control: 4 layer compression: Left lower extremity Elevate legs to the level of the heart or above for 30 minutes daily and/or when sitting, a frequency of: - throughout the day Support Garment 30-40 mm/Hg pressure  to: - Juxtalite to right leg Off-Loading: Low air-loss mattress (Group 2) Roho cushion for wheelchair Turn and reposition every 2 hours - out of wheelchair throughout the day, try to lay on sides, sleep in the bed not the recliner 1. I change the dressing on the left lateral leg to Hydrofera Blue 2. Still Iodoflex on the left dorsal foot 3. Still silver alginate between the right first and second toes post debridement today 4. Silver alginate to the left buttock Electronic Signature(s) Signed: 03/17/2020 5:40:04 PM By: Linton Ham MD Entered By: Linton Ham on 03/17/2020 08:18:44 -------------------------------------------------------------------------------- SuperBill Details Patient Name: Date of Service: [name] 03/17/2020 Medical Record [id number] Patient Account Number: [id number] Date of Birth/Sex: Treating RN: 1986-07-29 (34 y.o. [name] Primary Care Provider: [place], [place] Other Clinician: Referring Provider: Treating Provider/Extender:Takeru Bose, [name], [name]: 219 Diagnosis Coding ICD-10 Codes Code Description I87.332 Chronic venous hypertension (idiopathic) with ulcer and inflammation of left lower extremity L97.321 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 to breakdown of skin L97.521 Non-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 L97.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, 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, complete Facility Procedures CPT4 Code Description: [id number] - DEB SUBQ TISSUE 20 SQ CM/< ICD-10 Diagnosis Description L97.511 Non-pressure chronic ulcer of other part of right foot limited to Modifier Quantity: 1 breakdown of skin CPT4 Code Description: [id number] (Facility Use Only) [id number] - APPLY MULTLAY COMPRS LWR LT LEG Modifier Quantity: 27 1 Physician Procedures CPT4 Code  Description: [id number] - WC PHYS SUBQ TISS 20 SQ CM ICD-10 Diagnosis Description L97.511 Non-pressure chronic ulcer of other part of right foot limite Modifier: d to breakdown Quantity: 1 of skin Electronic Signature(s) Signed: 03/17/2020 5:33:33 PM By: Levan Hurst RN, BSN Signed: 03/17/2020 5:40:04 PM By: Linton Ham MD Entered By: Levan Hurst on 03/17/2020 09:42:30

## 2020-03-18 NOTE — Progress Notes (Signed)
Mayse, DANH POYSER ([id number]) Visit Report for 03/17/2020 Arrival Information Details Patient Name: Date of Service: LENNIE, GRAEFF 03/17/2020 7:30 AM Medical Record [id number] Patient Account Number: [id number] Date of Birth/Sex: Treating RN: May 12, 1986 (34 y.o. [name]) Carlene Coria Primary Care Jizelle Conkey: [place], [place] Other Clinician: Referring Deboraha Goar: Treating Nazier Neyhart/Extender:Robson, [name], [name]: 219 Visit Information History Since Last Visit All ordered tests and consults were completed: No Patient Arrived: Wheel Added or deleted any medications: No Chair Any new allergies or adverse reactions: No Arrival Time: 07:46 Had a fall or experienced change in No Accompanied By: self activities of daily living that may affect Transfer Assistance: None risk of falls: Patient Identification Verified: Yes Signs or symptoms of abuse/neglect since last visito No Secondary Verification Process Completed: Yes Hospitalized since last visit: No Patient Requires Transmission-Based No Implantable device outside of the clinic excluding No Precautions: cellular tissue based products placed in the center Patient Has Alerts: Yes R ABI = 1.0 since last visit: Patient Alerts: L ABI = 1.1 Pain Present Now: No Electronic Signature(s) Signed: 03/18/2020 5:28:23 PM By: Carlene Coria RN Entered By: Carlene Coria on 03/17/2020 07:47:30 -------------------------------------------------------------------------------- Compression Therapy Details Patient Name: Date of Service: DECKLIN, POINTER 03/17/2020 7:30 AM Medical Record [id number] Patient Account Number: [id number] Date of Birth/Sex: Treating RN: 16-Mar-1986 (34 y.o. [name] Primary Care Kierrah Kilbride: [place], [place] Other Clinician: Referring Alanni Vader: Treating Zaven Klemens/Extender:Robson, [name], [name]: 219 Compression Therapy Performed for Wound Wound #37 Left,Lateral  Malleolus Assessment: Performed By: Clinician Levan Hurst, RN Compression Type: Four Layer Post Procedure Diagnosis Same as Pre-procedure Electronic Signature(s) Signed: 03/17/2020 5:33:33 PM By: Levan Hurst RN, BSN Entered By: Levan Hurst on 03/17/2020 08:11:59 -------------------------------------------------------------------------------- Compression Therapy Details Patient Name: Date of Service: Farino, [name] 03/17/2020 7:30 AM Medical Record [id number] Patient Account Number: [id number] Date of Birth/Sex: Treating RN: 1986/11/14 (34 y.o. [name] Primary Care Euriah Matlack: [name], [name] Other Clinician: Referring Kaya Klausing: Treating Eldora Napp/Extender:Robson, [name], [name]: 219 Compression Therapy Performed for Wound Wound #40 Left,Lateral Lower Leg Assessment: Performed By: Clinician Levan Hurst, RN Compression Type: Four Layer Post Procedure Diagnosis Same as Pre-procedure Electronic Signature(s) Signed: 03/17/2020 5:33:33 PM By: Levan Hurst RN, BSN Entered By: Levan Hurst on 03/17/2020 08:12:00 -------------------------------------------------------------------------------- Encounter Discharge Information Details Patient Name: Date of Service: Seeling, [name] 03/17/2020 7:30 AM Medical Record [id number] Patient Account Number: [id number] Date of Birth/Sex: Treating RN: February 10, 1986 (34 y.o. [name] Primary Care Beyounce Dickens: [name], [name] Other Clinician: Referring Hinley Brimage: Treating Reygan Heagle/Extender:Robson, [name], [name]: 219 Encounter Discharge Information Items Post Procedure Vitals Discharge Condition: Stable Temperature (F): 98.3 Ambulatory Status: Wheelchair Pulse (bpm): 112 Discharge Destination: Home Respiratory Rate (breaths/min): 18 Transportation: Private Auto Blood Pressure (mmHg): 123/71 Accompanied By: self Schedule Follow-up Appointment: Yes Clinical  Summary of Care: Electronic Signature(s) Signed: 03/17/2020 5:14:33 PM By: Deon Pilling Entered By: Deon Pilling on 03/17/2020 08:27:19 -------------------------------------------------------------------------------- Lower Extremity Assessment Details Patient Name: Date of Service: OLTON, PREJEAN 03/17/2020 7:30 AM Medical Record [id number] Patient Account Number: [id number] Date of Birth/Sex: Treating RN: [birth year] (35 y.o. [name] Primary Care Zaim Nitta: [place], [place] Other Clinician: Referring Jossue Rubenstein: Treating Johny Pitstick/Extender:Robson, [name], [name]: 219 Edema Assessment Assessed: [Left: No] [Right: No] Edema: [Left: No] [Right: Yes] Calf Left: Right: Point of Measurement: 33 cm From Medial Instep 26.5 cm 33 cm Ankle Left: Right: Point of Measurement: 10 cm From Medial Instep 23 cm 25 cm Electronic Signature(s) Signed:  03/18/2020 5:28:23 PM By: Carlene Coria RN Entered By: Carlene Coria on 03/17/2020 07:50:22 -------------------------------------------------------------------------------- Multi Wound Chart Details Patient Name: Date of Service: Yarbro, [name] 03/17/2020 7:30 AM Medical Record [id number] Patient Account Number: [id number] Date of Birth/Sex: Treating RN: 1986-02-12 (34 y.o. [name] Primary Care Zaineb Nowaczyk: [name], [name] Other Clinician: Referring Lyman Balingit: Treating Darcella Shiffman/Extender:Robson, [name], [name]: 219 Vital Signs Height(in): 70 Pulse(bpm): 112 Weight(lbs): 216 Blood Pressure(mmHg):123/71 Body Mass Index(BMI): 31 Temperature(F): 98.3 Respiratory 18 Rate(breaths/min): Photos: [24:No Photos] [37:No Photos] [38:No Photos] Wound Location: [24:Left, Dorsal Foot] [37:Left, Lateral Malleolus] [38:Right Toe - Web between 1st and 2nd] Wounding Event: [24:Gradually Appeared] [37:Gradually Appeared] [38:Gradually Appeared] Primary Etiology: [24:Inflammatory] [37:Venous  Leg Ulcer] [38:Inflammatory] Secondary Etiology: [24:N/A] [37:N/A] [38:N/A] Comorbid History: [24:Sleep Apnea, Hypertension, Sleep Apnea, Hypertension, Sleep Apnea, Hypertension, Paraplegia] [37:Paraplegia] [38:Paraplegia] Date Acquired: [24:03/08/2018] [37:11/26/2019] [38:11/30/2019] Weeks of Treatment: [id number] [37:16] [38:15] Wound Status: [24:Open] [37:Open] [38:Open] Clustered Wound: [24:Yes] [37:No] [38:No] Clustered Quantity: [24:1] [37:N/A] [38:N/A] Measurements L x W x D 1.6x2.1x0.3 [37:3.5x1.4x0.1] [38:0.8x0.1x0.1] (cm) Area (cm) : [24:2.639] [37:3.848] [38:0.063] Volume (cm) : [id number] [37:0.385] [38:0.006] % Reduction in Area: [24:-1018.20%] [37:2.80%] [38:80.90%] % Reduction in Volume: -3200.00% [37:2.80%] [38:97.40%] Classification: [24:Full Thickness Without Exposed Support Structures Exposed Support Structures Exposed Support Structures] [37:Full Thickness Without] [38:Full Thickness Without] Exudate Amount: [24:Medium] [37:Medium] [38:Small] Exudate Type: [24:Serosanguineous] [37:Serosanguineous] [38:Serosanguineous] Exudate Color: [24:red, brown] [37:red, brown] [38:red, brown] Wound Margin: [24:Flat and Intact] [37:Flat and Intact] [38:Flat and Intact] Granulation Amount: [24:Large (67-100%)] [37:Large (67-100%)] [38:Large (67-100%)] Granulation Quality: [24:Red] [37:Red] [38:Red] Necrotic Amount: [24:Small (1-33%)] [37:None Present (0%)] [38:None Present (0%)] Exposed Structures: [24:Fat Layer (Subcutaneous Fat Layer (Subcutaneous Fat Layer (Subcutaneous Tissue) Exposed: Yes Fascia: No Tendon: No Muscle: No Joint: No Bone: No] [37:Tissue) Exposed: Yes Fascia: No Tendon: No Muscle: No Joint: No Bone: No] [38:Tissue) Exposed: Yes  Fascia: No Tendon: No Muscle: No Joint: No Bone: No] Epithelialization: [24:Small (1-33%)] [37:Small (1-33%)] [38:Medium (34-66%)] Debridement: [24:N/A] [37:N/A] [38:Debridement - Excisional] Pre-procedure [24:N/A] [37:N/A]  [38:08:07] Verification/Time Out Taken: Tissue Debrided: [24:N/A] [37:N/A] [38:Subcutaneous, Slough] Level: [24:N/A] [37:N/A] [38:Skin/Subcutaneous Tissue] Debridement Area (sq cm):N/A [37:N/A] [38:0.08] Instrument: [24:N/A] [37:N/A] [38:Curette] Bleeding: [24:N/A] [37:N/A] [38:Minimum] Hemostasis Achieved: [24:N/A] [37:N/A] [38:Pressure] Procedural Pain: [24:N/A] [37:N/A] [38:0] Post Procedural Pain: [24:N/A] [37:N/A] [38:0] Debridement Treatment N/A [37:N/A] [38:Procedure was tolerated] Response: [38:well] Post Debridement [24:N/A] [37:N/A] [38:0.8x0.1x0.1] Measurements L x W x D (cm) Post Debridement [24:N/A] [37:N/A] [38:0.006] Volume: (cm) Procedures Performed: N/A [24:40] [37:Compression Therapy 41] [38:Debridement N/A] Photos: [24:No Photos] [37:No Photos] [38:N/A] Wound Location: [24:Left, Lateral Lower Leg] [37:Left Ischium] [38:N/A] Wounding Event: [24:Gradually Appeared] [37:Gradually Appeared] [38:N/A] Primary Etiology: [24:Venous Leg Ulcer] [37:Pressure Ulcer] [38:N/A] Secondary Etiology: [24:Lymphedema] [37:N/A] [38:N/A] Comorbid History: [24:Sleep Apnea, Hypertension, Sleep Apnea, Hypertension, N/A Paraplegia] [37:Paraplegia] Date Acquired: [24:02/11/2020] [37:03/16/2020] [38:N/A] Weeks of Treatment: [24:5] [37:0] [38:N/A] Wound Status: [24:Open] [37:Open] [38:N/A] Clustered Wound: [24:No] [37:No] [38:N/A] Clustered Quantity: [24:N/A] [37:N/A] [38:N/A] Measurements L x W x D [24:0.3x0.1x0.1] [37:1x2.3x0.1] [38:N/A] (cm) Area (cm) : [24:0.024] [37:1.806] [38:N/A] Volume (cm) : [24:0.002] [37:0.181] [38:N/A] % Reduction in Area: [24:93.90%] [37:N/A] [38:N/A] % Reduction in Volume: [24:94.90%] [37:N/A] [38:N/A] Classification: [24:Full Thickness Without Exposed Support Structures] [37:Category/Stage II] [38:N/A] Exudate Amount: [24:Small] [37:Medium] [38:N/A] Exudate Type: [24:Serosanguineous] [37:Serosanguineous] [38:N/A] Exudate Color: [24:red, brown] [37:red,  brown] [38:N/A] Wound Margin: [24:Flat and Intact] [37:Flat and Intact] [38:N/A] Granulation Amount: [24:Large (67-100%)] [37:Large (67-100%)] [38:N/A] Granulation Quality: [24:Pink] [37:Red] [38:N/A] Necrotic Amount: [24:None Present (0%)] [37:None Present (0%)] [38:N/A] Exposed Structures: [24:Fat Layer (Subcutaneous Fat Layer (Subcutaneous  Tissue) Exposed: Yes Fascia: No Tendon: No Muscle: No Joint: No Bone: No] [37:Tissue) Exposed: Yes Fascia: No Tendon: No Muscle: No Joint: No Bone: No] [38:N/A] Epithelialization: [24:Medium (34-66%)] [37:None] [38:N/A] Debridement: [24:N/A] [37:N/A] [38:N/A] Tissue Debrided: [24:N/A] [37:N/A] [38:N/A] Level: [24:N/A] [37:N/A] [38:N/A] Debridement Area (sq cm):N/A [37:N/A] [38:N/A] Instrument: [24:N/A] [37:N/A] [38:N/A] Bleeding: [24:N/A] [37:N/A] [38:N/A] Hemostasis Achieved: [24:N/A] [37:N/A] [38:N/A] Procedural Pain: [24:N/A] [37:N/A] [38:N/A] Post Procedural Pain: [24:N/A] [37:N/A] [38:N/A] Debridement Treatment N/A [37:N/A] [38:N/A] Response: Post Debridement [24:N/A] [37:N/A] [38:N/A] Measurements L x W x D (cm) Post Debridement [24:N/A] [37:N/A] [38:N/A] Volume: (cm) Procedures Performed: Compression Therapy [37:N/A] [38:N/A] Treatment Notes Electronic Signature(s) Signed: 03/17/2020 5:33:33 PM By: Levan Hurst RN, BSN Signed: 03/17/2020 5:40:04 PM By: Linton Ham MD Entered By: Linton Ham on 03/17/2020 08:13:51 -------------------------------------------------------------------------------- Multi-Disciplinary Care Plan Details Patient Name: Date of Service: [name]. 03/17/2020 7:30 AM Medical Record [id number] Patient Account Number: [id number] Date of Birth/Sex: Treating RN: Sep 08, 1986 (34 y.o. [name] Primary Care Noely Kuhnle: [name], [name] Other Clinician: Referring Aashna Matson: Treating Timothee Gali/Extender:Robson, [name], [name]: 219 Active Inactive Wound/Skin  Impairment Nursing Diagnoses: Impaired tissue integrity Knowledge deficit related to ulceration/compromised skin integrity Goals: Patient/caregiver will verbalize understanding of skin care regimen Date Initiated: 01/05/2016 Target Resolution Date: 04/11/2020 Goal Status: Active Ulcer/skin breakdown will have a volume reduction of 30% by week 4 Date Initiated: 01/05/2016 Date Inactivated: 12/22/2017 Target Resolution Date: 01/19/2018 Unmet Reason: complex Goal Status: Unmet wounds, infection Interventions: Assess patient/caregiver ability to obtain necessary supplies Assess ulceration(s) every visit Provide education on ulcer and skin care Notes: Electronic Signature(s) Signed: 03/17/2020 5:33:33 PM By: Levan Hurst RN, BSN Entered By: Levan Hurst on 03/17/2020 07:56:26 -------------------------------------------------------------------------------- Pain Assessment Details Patient Name: Date of Service: Closs, [name] 03/17/2020 7:30 AM Medical Record [id number] Patient Account Number: [id number] Date of Birth/Sex: Treating RN: Dec 16, 1985 (34 y.o. [name] Primary Care Lavon Bothwell: [place], [place] Other Clinician: Referring Deaun Rocha: Treating Zyair Rhein/Extender:Robson, [name], [name]: 219 Active Problems Location of Pain Severity and Description of Pain Patient Has Paino No Site Locations Pain Management and Medication Current Pain Management: Electronic Signature(s) Signed: 03/18/2020 5:28:23 PM By: Carlene Coria RN Entered By: Carlene Coria on 03/17/2020 07:48:17 -------------------------------------------------------------------------------- Patient/Caregiver Education Details Patient Name: Date of Service: CRIBBGrace Bushy 4/26/2021andnbsp7:30 AM Medical Record [phone] Patient Account Number: [id number] Date of Birth/Gender: 05-Oct-1986 (33 y.o. M) Treating RN: Levan Hurst Primary Care Physician: Janine Limbo Other  Clinician: Referring Physician: Treating Physician/Extender:Robson, Pecola Leisure in Treatment: 32 Education Assessment Education Provided To: Patient Education Topics Provided Wound/Skin Impairment: Methods: Explain/Verbal Responses: State content correctly Electronic Signature(s) Signed: 03/17/2020 5:33:33 PM By: Levan Hurst RN, BSN Entered By: Levan Hurst on 03/17/2020 07:56:41 -------------------------------------------------------------------------------- Wound Assessment Details Patient Name: Date of Service: Desha, [name] 03/17/2020 7:30 AM Medical Record [id number] Patient Account Number: [id number] Date of Birth/Sex: Treating RN: 1986-02-26 (34 y.o. [name] Primary Care Chord Takahashi: [place], [place] Other Clinician: Referring Sander Speckman: Treating Tracen Mahler/Extender:Robson, [name], [name]: 219 Wound Status Wound Number: 24 Primary Etiology: Inflammatory Wound Location: Left, Dorsal Foot Wound Status: Open Wounding Event: Gradually Appeared Comorbid History:Sleep Apnea, Hypertension, Paraplegia Date Acquired: 03/08/2018 Weeks Of Treatment:105 Clustered Wound: Yes Photos Photo Uploaded By: Mikeal Hawthorne on 03/18/2020 14:00:02 Wound Measurements Length: (cm) 1.6 % Reduct Width: (cm) 2.1 % Reduct Depth: (cm) 0.3 Epitheli Clustered Quantity: 1 Tunnelin Area: (cm) 2.639 Undermi Volume: (cm) 0.792 Wound Description Full Thickness Without Exposed Support Foul Odo Classification: Structures Slough/F  Wound Flat and Intact Margin: Exudate Medium Amount: Exudate Type:Serosanguineous Exudate red, brown Color: Wound Bed Granulation Amount: Large (67-100%) Granulation Quality: Red Fascia E Necrotic Amount: Small (1-33%) Fat Laye Necrotic Quality: Adherent Slough Tendon E Muscle E Joint Ex Bone Exp r After Cleansing: No ibrino Yes Exposed Structure xposed: No r (Subcutaneous Tissue) Exposed: Yes xposed:  No xposed: No posed: No osed: No ion in Area: -1018.2% ion in Volume: -3200% alization: Small (1-33%) g: No ning: No Treatment Notes Wound #24 (Left, Dorsal Foot) 1. Cleanse With Wound Cleanser Soap and water 2. Periwound Care Antifungal cream Barrier cream Moisturizing lotion TCA Cream 3. Primary Dressing Applied Iodoflex 4. Secondary Dressing Dry Gauze 6. Support Layer Applied 4 layer compression wrap Electronic Signature(s) Signed: 03/18/2020 5:28:23 PM By: Carlene Coria RN Entered By: Carlene Coria on 03/17/2020 07:52:18 -------------------------------------------------------------------------------- Wound Assessment Details Patient Name: Date of Service: RADLEE, MANECKE 03/17/2020 7:30 AM Medical Record [id number] Patient Account Number: [id number] Date of Birth/Sex: Treating RN: 18-Feb-1986 (34 y.o. [name]) Carlene Coria Primary Care Kayliah Tindol: [place], [place] Other Clinician: Referring Liv Rallis: Treating Nashae Maudlin/Extender:Robson, [name], [name]: 219 Wound Status Wound Number: 37 Primary Etiology: Venous Leg Ulcer Wound Location: Left, Lateral Malleolus Wound Status: Open Wounding Event: Gradually Appeared Comorbid History:Sleep Apnea, Hypertension, Paraplegia Date Acquired: 11/26/2019 Weeks Of Treatment:16 Clustered Wound: No Photos Photo Uploaded By: Mikeal Hawthorne on 03/18/2020 13:59:27 Wound Measurements Length: (cm) 3.5 % Reduct Width: (cm) 1.4 % Reduct Depth: (cm) 0.1 Epitheli Area: (cm) 3.848 Tunneli Volume: (cm) 0.385 Undermi Wound Description Classification: Full Thickness Without Exposed Support Foul Odo Structures Slough/F Wound Flat and Intact Margin: Exudate Medium Amount: Exudate Type:Serosanguineous Exudate red, brown Color: Wound Bed Granulation Amount: Large (67-100%) Granulation Quality: Red Fascia E Necrotic Amount: None Present (0%) Fat Laye Tendon E Muscle E Joint Ex Bone Exp r After Cleansing:  No ibrino No Exposed Structure xposed: No r (Subcutaneous Tissue) Exposed: Yes xposed: No xposed: No posed: No osed: No ion in Area: 2.8% ion in Volume: 2.8% alization: Small (1-33%) ng: No ning: No Treatment Notes Wound #37 (Left, Lateral Malleolus) 1. Cleanse With Wound Cleanser Soap and water 2. Periwound Care Barrier cream Moisturizing lotion TCA Cream 3. Primary Dressing Applied Hydrofera Blue 4. Secondary Dressing ABD Pad 6. Support Layer Applied 4 layer compression wrap Electronic Signature(s) Signed: 03/18/2020 5:28:23 PM By: Carlene Coria RN Entered By: Carlene Coria on 03/17/2020 07:53:10 -------------------------------------------------------------------------------- Wound Assessment Details Patient Name: Date of Service: JONEL, PROUTY 03/17/2020 7:30 AM Medical Record [id number] Patient Account Number: [id number] Date of Birth/Sex: Treating RN: 10-Aug-1986 (34 y.o. [name]) Carlene Coria Primary Care Sandy Haye: [name], [name] Other Clinician: Referring Emerald Shor: Treating Darrik Richman/Extender:Robson, [name], [name]: 219 Wound Status Wound Number: 38 Primary Etiology: Inflammatory Wound Location: Right Toe - Web between 1st and Wound Status: Open 2nd Comorbid History:Sleep Apnea, Hypertension, Paraplegia Wounding Event: Gradually Appeared Date Acquired: 11/30/2019 Weeks Of Treatment:15 Clustered Wound: No Photos Photo Uploaded By: Mikeal Hawthorne on 03/18/2020 13:59:27 Wound Measurements Length: (cm) 0.8 % Reduct Width: (cm) 0.1 % Reduct Depth: (cm) 0.1 Epitheli Area: (cm) 0.063 Tunneli Volume: (cm) 0.006 Undermi Wound Description Full Thickness Without Exposed Support Foul Odo Classification: Structures Slough/F Wound Flat and Intact Margin: Exudate Small Amount: Exudate Type:Serosanguineous Exudate red, brown Color: Wound Bed Granulation Amount: Large (67-100%) Granulation Quality: Red Fascia E Necrotic Amount: None  Present (0%) Fat Laye Tendon E Muscle E Joint Ex Bone Exp r After Cleansing: No ibrino No Exposed Structure xposed: No  r (Subcutaneous Tissue) Exposed: Yes xposed: No xposed: No posed: No osed: No ion in Area: 80.9% ion in Volume: 97.4% alization: Medium (34-66%) ng: No ning: No Treatment Notes Wound #38 (Right Toe - Web between 1st and 2nd) 1. Cleanse With Wound Cleanser 2. Periwound Care Antifungal cream Barrier cream Moisturizing lotion TCA Cream 3. Primary Dressing Applied Calcium Alginate Ag 4. Secondary Dressing Dry Gauze Roll Gauze 5. Secured With [employer] tape Notes Development worker, international aid) Signed: 03/18/2020 5:28:23 PM By: Carlene Coria RN Entered By: Carlene Coria on 03/17/2020 07:53:29 -------------------------------------------------------------------------------- Wound Assessment Details Patient Name: Date of Service: CRIBBDyson, Ornellas 03/17/2020 7:30 AM Medical Record [id number] Patient Account Number: [id number] Date of Birth/Sex: Treating RN: 1986/05/16 (34 y.o. [name]) Carlene Coria Primary Care Khristian Phillippi: [place], [place] Other Clinician: Referring Marabelle Cushman: Treating Suhan Paci/Extender:Robson, [name], [name]: 219 Wound Status Wound Number: 40 Primary Etiology: Venous Leg Ulcer Wound Location: Left, Lateral Lower Leg Secondary Lymphedema Wounding Event: Gradually Appeared Etiology: Date Acquired: 02/11/2020 Wound Status: Open Weeks Of Treatment:5 Comorbid History: Sleep Apnea, Hypertension, Clustered Wound: No Paraplegia Photos Photo Uploaded By: Mikeal Hawthorne on 03/18/2020 14:00:03 Wound Measurements Length: (cm) 0.3 % Reduct Width: (cm) 0.1 % Reduct Depth: (cm) 0.1 Epitheli Area: (cm) 0.024 Tunneli Volume: (cm) 0.002 Undermi Wound Description Classification: Full Thickness Without Exposed Support Foul Odo Structures Slough/F Wound Flat and Intact Margin: Exudate Small Amount: Exudate  Type:Serosanguineous Exudate red, brown Color: Wound Bed Granulation Amount: Large (67-100%) Granulation Quality: Pink Fascia E Necrotic Amount: None Present (0%) Fat Laye Tendon E Muscle E Joint Ex Bone Exp r After Cleansing: No ibrino Yes Exposed Structure xposed: No r (Subcutaneous Tissue) Exposed: Yes xposed: No xposed: No posed: No osed: No ion in Area: 93.9% ion in Volume: 94.9% alization: Medium (34-66%) ng: No ning: No Treatment Notes Wound #40 (Left, Lateral Lower Leg) 1. Cleanse With Wound Cleanser Soap and water 2. Periwound Care Barrier cream Moisturizing lotion TCA Cream 3. Primary Dressing Applied Calcium Alginate Ag 4. Secondary Dressing ABD Pad 6. Support Layer Applied 4 layer compression wrap Electronic Signature(s) Signed: 03/18/2020 5:28:23 PM By: Carlene Coria RN Entered By: Carlene Coria on 03/17/2020 07:53:48 -------------------------------------------------------------------------------- Wound Assessment Details Patient Name: Date of Service: CRIBBGeary, Puzio 03/17/2020 7:30 AM Medical Record [id number] Patient Account Number: [id number] Date of Birth/Sex: Treating RN: 1986-03-07 (34 y.o. [name]) Carlene Coria Primary Care Jaquavious Mercer: [place], [place] Other Clinician: Referring Nikaela Coyne: Treating Kamar Callender/Extender:Robson, [name], [name]: 219 Wound Status Wound Number: 41 Primary Etiology: Pressure Ulcer Wound Location: Left Ischium Wound Status: Open Wounding Event: Gradually Appeared Comorbid History:Sleep Apnea, Hypertension, Paraplegia Date Acquired: 03/16/2020 Weeks Of Treatment:0 Clustered Wound: No Photos Photo Uploaded By: Mikeal Hawthorne on 03/18/2020 14:00:57 Wound Measurements Length: (cm) 1 Width: (cm) 2.3 Depth: (cm) 0.1 Area: (cm) 1.806 Volume: (cm) 0.181 Wound Description Classification: Category/Stage II Wound Margin: Flat and Intact Exudate Amount: Medium Exudate Type:  Serosanguineous Exudate Color: red, brown Wound Bed Granulation Amount: Large (67-100%) Granulation Quality: Red Necrotic Amount: None Present (0%) fter Cleansing: No ino No Exposed Structure sed: No Subcutaneous Tissue) Exposed: Yes sed: No sed: No ed: No d: No % Reduction in Area: % Reduction in Volume: Epithelialization: None Tunneling: No Undermining: No Foul Odor A Slough/Fibr Fascia Expo Fat Layer ( Tendon Expo Muscle Expo Joint Expos Bone Expose Treatment Notes 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 Cleanse With Wound Cleanser 3. Primary Dressing Applied Calcium Alginate Ag 4. Secondary Dressing Foam Border Dressing 5. Secured With Self Adhesive Bandage  Electronic Signature(s) Signed: 03/18/2020 5:28:23 PM By: Carlene Coria RN Entered By: Carlene Coria on 03/17/2020 07:55:30 -------------------------------------------------------------------------------- [place] Details Patient Name: Date of Service: Simeone, [name] 03/17/2020 7:30 AM Medical Record [id number] Patient Account Number: [id number] Date of Birth/Sex: Treating RN: 29-Jun-1986 (34 y.o. [name] Cisse: [place], [place] Other Clinician: Referring Syble Picco: Treating Monta Maiorana/Extender:Robson, [name], [name]: 219 Vital Signs Time Taken: 07:47 Temperature (F): 98.3 Height (in): 70 Pulse (bpm): 112 Weight (lbs): 216 Respiratory Rate (breaths/min): 18 Body Mass Index (BMI): 31 Blood Pressure (mmHg): 123/71 Reference Range: 80 - 120 mg / dl Electronic Signature(s) Signed: 03/18/2020 5:28:23 PM By: Carlene Coria RN Entered By: Carlene Coria on 03/17/2020 07:48:09

## 2020-03-24 ENCOUNTER — Encounter (HOSPITAL_BASED_OUTPATIENT_CLINIC_OR_DEPARTMENT_OTHER): Payer: BC Managed Care – PPO | Attending: Internal Medicine | Admitting: Internal Medicine

## 2020-03-24 DIAGNOSIS — L97511 Non-pressure chronic ulcer of other part of right foot limited to breakdown of skin: Secondary | ICD-10-CM | POA: Diagnosis not present

## 2020-03-24 DIAGNOSIS — I87332 Chronic venous hypertension (idiopathic) with ulcer and inflammation of left lower extremity: Secondary | ICD-10-CM | POA: Insufficient documentation

## 2020-03-24 DIAGNOSIS — Z87891 Personal history of nicotine dependence: Secondary | ICD-10-CM | POA: Diagnosis not present

## 2020-03-24 DIAGNOSIS — L89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, stage 3: Secondary | ICD-10-CM | POA: Diagnosis not present

## 2020-03-24 DIAGNOSIS — L89224 Pressure ulcer of left hip, stage 4: Secondary | ICD-10-CM | POA: Diagnosis not present

## 2020-03-24 DIAGNOSIS — L97521 Non-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: Secondary | ICD-10-CM | POA: Insufficient documentation

## 2020-03-24 DIAGNOSIS — L97321 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 to breakdown of skin: Secondary | ICD-10-CM | POA: Diagnosis not present

## 2020-03-24 DIAGNOSIS — G8221 Paraplegia, complete: Secondary | ICD-10-CM | POA: Diagnosis not present

## 2020-03-24 NOTE — Progress Notes (Signed)
[name], [name] ([id number]) Visit Report for 03/24/2020 Arrival Information Details Patient Name: Date of Service: [name], [name] Side. 03/24/2020 7:30 A M Medical Record Number: [id number] Patient Account Number: [id number] Date of Birth/Sex: Treating RN: 11-04-1986 (34 y.o. [name] Primary Care Quentin Strebel: [name], [name] Other Clinician: Referring Anuhea Gassner: Treating Kelliann Pendergraph/Extender: [name]: 26 Visit Information History Since Last Visit Added or deleted any medications: No Patient Arrived: Wheel Chair Any new allergies or adverse reactions: No Arrival Time: 07:45 Had a fall or experienced change in No Accompanied By: self activities of daily living that may affect Transfer Assistance: None risk of falls: Patient Identification Verified: Yes Signs or symptoms of abuse/neglect since last visito No Secondary Verification Process Completed: Yes Hospitalized since last visit: No Patient Requires Transmission-Based Precautions: No Implantable device outside of the clinic excluding No Patient Has Alerts: Yes cellular tissue based products placed in the center Patient Alerts: R ABI = 1.0 since last visit: L ABI = 1.1 Has Dressing in Place as Prescribed: Yes Has Compression in Place as Prescribed: Yes Pain Present Now: No Electronic Signature(s) Signed: 03/24/2020 4:26:09 PM By: Deon Pilling Entered By: Deon Pilling on 03/24/2020 07:51:03 -------------------------------------------------------------------------------- Compression Therapy Details Patient Name: Date of Service: [name], [name] 03/24/2020 7:30 A M Medical Record Number: [id number] Patient Account Number: [id number] Date of Birth/Sex: Treating RN: 20-Sep-1986 (34 y.o. [name] Primary Care Darean Rote: [place], [place] Other Clinician: Referring Greig Altergott: Treating Maygan Koeller/Extender: [name]: 220 Compression Therapy Performed for Wound  Assessment: Wound #40 Left,Lateral Lower Leg Performed By: Clinician Levan Hurst, RN Compression Type: Four Layer Post Procedure Diagnosis Same as Pre-procedure Electronic Signature(s) Signed: 03/24/2020 5:27:06 PM By: Levan Hurst RN, BSN Entered By: Levan Hurst on 03/24/2020 08:26:14 -------------------------------------------------------------------------------- Compression Therapy Details Patient Name: Date of Service: [name], [name] 03/24/2020 7:30 A M Medical Record Number: [id number] Patient Account Number: [id number] Date of Birth/Sex: Treating RN: March 19, 1986 (34 y.o. [name] Primary Care Alysse Rathe: [place], [place] Other Clinician: Referring Hazley Dezeeuw: Treating Erroll Wilbourne/Extender: [name]: 220 Compression Therapy Performed for Wound Assessment: Wound #37 Left,Lateral Malleolus Performed By: Clinician Levan Hurst, RN Compression Type: Four Layer Post Procedure Diagnosis Same as Pre-procedure Electronic Signature(s) Signed: 03/24/2020 5:27:06 PM By: Levan Hurst RN, BSN Entered By: Levan Hurst on 03/24/2020 08:26:14 -------------------------------------------------------------------------------- Encounter Discharge Information Details Patient Name: Date of Service: [name], [name] 03/24/2020 7:30 A M Medical Record Number: [id number] Patient Account Number: [id number] Date of Birth/Sex: Treating RN: 10/11/1986 (34 y.o. [name] Primary Care Dae Antonucci: [place], [place] Other Clinician: Referring Katiya Fike: Treating Aysiah Jurado/Extender: [name]: 220 Encounter Discharge Information Items Post Procedure Vitals Discharge Condition: Stable Temperature (F): 98.1 Ambulatory Status: Wheelchair Pulse (bpm): 104 Discharge Destination: Home Respiratory Rate (breaths/min): 18 Transportation: Other Blood Pressure (mmHg): 113/63 Accompanied By: self Schedule Follow-up  Appointment: Yes Clinical Summary of Care: Patient Declined Electronic Signature(s) Signed: 03/24/2020 4:55:42 PM By: Kela Millin Entered By: Kela Millin on 03/24/2020 08:36:17 -------------------------------------------------------------------------------- Lower Extremity Assessment Details Patient Name: Date of Service: [name], [name] 03/24/2020 7:30 A M Medical Record Number: [id number] Patient Account Number: [id number] Date of Birth/Sex: Treating RN: 12/17/1985 (34 y.o. [name] Primary Care Lametria Klunk: [place], [place] Other Clinician: Referring Makaia Rappa: Treating Kaliann Coryell/Extender: Dutch Gray, [name]: 220 Edema Assessment Assessed: [Left: Yes] [Right: Yes] Edema: [Left: No] [Right: Yes] Calf Left: Right: Point of Measurement: 33  cm From Medial Instep 27.5 cm 32.5 cm Ankle Left: Right: Point of Measurement: 10 cm From Medial Instep 25.5 cm 23 cm Electronic Signature(s) Signed: 03/24/2020 4:26:09 PM By: Deon Pilling Signed: 03/24/2020 5:27:06 PM By: Levan Hurst RN, BSN Entered By: Deon Pilling on 03/24/2020 07:54:26 -------------------------------------------------------------------------------- Multi Wound Chart Details Patient Name: Date of Service: [name], [name] 03/24/2020 7:30 A M Medical Record Number: [id number] Patient Account Number: [id number] Date of Birth/Sex: Treating RN: June 23, 1986 (34 y.o. [name] Primary Care Wilgus Deyton: [place], [place] Other Clinician: Referring Maekayla Giorgio: Treating Leighana Neyman/Extender: Dutch Gray, [name]: 220 Vital Signs Height(in): 70 Pulse(bpm): 104 Weight(lbs): 216 Blood Pressure(mmHg): 113/63 Body Mass Index(BMI): 31 Temperature(F): 98.1 Respiratory Rate(breaths/min): 18 Photos: [24:No Photos Left, Dorsal Foot] [37:No Photos Left, Lateral Malleolus] [38:No 53 Right T - Web between 1st and 2nd oe] Wound Location: [24:Gradually Appeared]  [37:Gradually Appeared] [38:Gradually Appeared] Wounding Event: [24:Inflammatory] [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] [38:Inflammatory] Primary Etiology: [24:N/A] [37:N/A] [38:N/A] Secondary Etiology: [24:Sleep Apnea, Hypertension, Paraplegia Sleep Apnea, Hypertension, Paraplegia Sleep Apnea, Hypertension, Paraplegia] Comorbid History: [24:03/08/2018] [37:11/26/2019] [38:11/30/2019] Date Acquired: [24:106] [37:17] [38:16] Weeks of Treatment: [24:Open] [37:Open] [38:Open] Wound Status: [24:Yes] [37:No] [38:No] Clustered Wound: [24:1] [37:N/A] [38:N/A] Clustered Quantity: [24:1.5x1.5x0.2] [37:3.4x1.2x0.1] [38:0.5x0.2x0.2] Measurements L x W x D (cm) [24:1.767] [37:3.204] [38:0.079] A (cm) : rea [24:0.353] [37:0.32] [38:0.016] Volume (cm) : [24:-648.70%] [37:19.10%] [38:76.10%] % Reduction in A rea: [24:-1370.80%] [37:19.20%] [38:93.10%] % Reduction in Volume: [24:Full Thickness Without Exposed] [37:Full Thickness Without Exposed] [38:Full Thickness Without Exposed] Classification: [24:Support Structures Medium] [37:Support Structures Medium] [38:Support Structures Small] Exudate Amount: [24:Serosanguineous] [37:Serosanguineous] [38:Serosanguineous] Exudate Type: [24:red, brown] [37:red, brown] [38:red, brown] Exudate Color: [24:Flat and Intact] [37:Flat and Intact] [38:Flat and Intact] Wound Margin: [24:Large (67-100%)] [37:Large (67-100%)] [38:Large (67-100%)] Granulation Amount: [24:Red] [37:Red] [38:Red] Granulation Quality: [24:Small (1-33%)] [37:None Present (0%)] [38:None Present (0%)] Necrotic Amount: [24:Fat Layer (Subcutaneous Tissue)] [37:Fat Layer (Subcutaneous Tissue)] [38:Fat Layer (Subcutaneous Tissue)] Exposed Structures: [24:Exposed: Yes Fascia: No Tendon: No Muscle: No Joint: No Bone: No Medium (34-66%)] [37:Exposed: Yes Fascia: No Tendon: No Muscle: No Joint: No Bone: No Small (1-33%)] [38:Exposed: Yes Fascia: No Tendon: No Muscle: No Joint: No Bone: No Large  (67-100%)] Epithelialization:  [24:N/A] [37:N/A] [38:Debridement - Selective/Open Wound] Debridement: [24:N/A] [37:N/A] [38:08:23] Pre-procedure Verification/Time Out Taken: [24:N/A] [37:N/A] [38:Slough] Tissue Debrided: [24:N/A] [37:N/A] [38:Non-Viable Tissue] Level: [24:N/A] [37:N/A] [38:0.1] Debridement A (sq cm): [24:rea N/A] [37:N/A] [38:Curette] Instrument: [24:N/A] [37:N/A] [38:Minimum] Bleeding: [24:N/A] [37:N/A] [38:Pressure] Hemostasis A chieved: [24:N/A] [37:N/A] [38:0] Procedural Pain: [24:N/A] [37:N/A] [38:0] Post Procedural Pain: [24:N/A] [37:N/A] [38:Procedure was tolerated well] Debridement Treatment Response: [24:N/A] [37:N/A] [38:0.5x0.2x0.2] Post Debridement Measurements L x W x D (cm) [24:N/A] [37:N/A] [38:0.016] Post Debridement Volume: (cm) [24:N/A] [37:Compression Therapy] [38:Debridement] Wound Number: [id number] N/A Photos: No Photos No Photos N/A Left, Lateral Lower Leg Left Ischium N/A Wound Location: Gradually Appeared Gradually Appeared N/A Wounding Event: Venous Leg Ulcer Pressure Ulcer N/A Primary Etiology: Lymphedema N/A N/A Secondary Etiology: Sleep Apnea, Hypertension, Paraplegia Sleep Apnea, Hypertension, Paraplegia N/A Comorbid History: 02/11/2020 03/16/2020 N/A Date Acquired: 6 1 N/A Weeks of Treatment: Open Open N/A Wound Status: No No N/A Clustered Wound: N/A N/A N/A Clustered Quantity: 0.5x0.3x0.1 0.1x0.1x0.1 N/A Measurements L x W x D (cm) 0.118 0.008 N/A A (cm) : rea 0.012 0.001 N/A Volume (cm) : 70.00% 99.60% N/A % Reduction in A rea: 69.20% 99.40% N/A % Reduction in Volume: Full Thickness Without Exposed Category/Stage II N/A Classification: Support Structures Small Small N/A Exudate A mount: Serosanguineous Serosanguineous N/A  Exudate Type: red, brown red, brown N/A Exudate Color: Flat and Intact Flat and Intact N/A Wound Margin: Large (67-100%) Large (67-100%) N/A Granulation A mount: Pink Red N/A Granulation Quality: Small (1-33%) None  Present (0%) N/A Necrotic A mount: Fat Layer (Subcutaneous Tissue) Fat Layer (Subcutaneous Tissue) N/A Exposed Structures: Exposed: Yes Exposed: Yes Fascia: No Fascia: No Tendon: No Tendon: No Muscle: No Muscle: No Joint: No Joint: No Bone: No Bone: No Medium (34-66%) Large (67-100%) N/A Epithelialization: N/A N/A N/A Debridement: N/A N/A N/A Tissue Debrided: N/A N/A N/A Level: N/A N/A N/A Debridement A (sq cm): rea N/A N/A N/A Instrument: N/A N/A N/A Bleeding: N/A N/A N/A Hemostasis A chieved: N/A N/A N/A Procedural Pain: N/A N/A N/A Post Procedural Pain: Debridement Treatment Response: N/A N/A N/A Post Debridement Measurements L x N/A N/A N/A W x D (cm) N/A N/A N/A Post Debridement Volume: (cm) Compression Therapy N/A N/A Procedures Performed: Treatment Notes Electronic Signature(s) Signed: 03/24/2020 5:27:06 PM By: Levan Hurst RN, BSN Signed: 03/24/2020 5:33:20 PM By: Linton Ham MD Entered By: Linton Ham on 03/24/2020 08:28:18 -------------------------------------------------------------------------------- Multi-Disciplinary Care Plan Details Patient Name: Date of Service: [place], [name] 03/24/2020 7:30 A M Medical Record Number: [id number] Patient Account Number: [id number] Date of Birth/Sex: Treating RN: [birth year] (34 y.o. [name] Primary Care Charnel Giles: [name], [name] Other Clinician: Referring Gabriel Paulding: Treating Decklyn Hornik/Extender: [name]: 220 Active Inactive Wound/Skin Impairment Nursing Diagnoses: Impaired tissue integrity Knowledge deficit related to ulceration/compromised skin integrity Goals: Patient/caregiver will verbalize understanding of skin care regimen Date Initiated: 01/05/2016 Target Resolution Date: 04/11/2020 Goal Status: Active Ulcer/skin breakdown will have a volume reduction of 30% by week 4 Date Initiated: 01/05/2016 Date Inactivated: 12/22/2017 Target  Resolution Date: 01/19/2018 Unmet Reason: complex wounds, Goal Status: Unmet infection Interventions: Assess patient/caregiver ability to obtain necessary supplies Assess ulceration(s) every visit Provide education on ulcer and skin care Notes: Electronic Signature(s) Signed: 03/24/2020 5:27:06 PM By: Levan Hurst RN, BSN Entered By: Levan Hurst on 03/24/2020 07:51:08 -------------------------------------------------------------------------------- Pain Assessment Details Patient Name: Date of Service: [place], [name] 03/24/2020 7:30 A M Medical Record Number: [id number] Patient Account Number: [id number] Date of Birth/Sex: Treating RN: 03/04/1986 (34 y.o. [name] Primary Care Dinisha Cai: [place], [place] Other Clinician: Referring Janautica Netzley: Treating Mercy Leppla/Extender: [name]: 220 Active Problems Location of Pain Severity and Description of Pain Patient Has Paino No Site Locations Rate the pain. Rate the pain. Current Pain Level: 0 Pain Management and Medication Current Pain Management: Medication: No Cold Application: No Rest: No Massage: No Activity: No T.FerrellN.S.: No Heat Application: No Leg drop or elevation: No Is the Current Pain Management Adequate: Adequate How does your wound impact your activities of daily livingo Sleep: No Bathing: No Appetite: No Relationship With Others: No Bladder Continence: No Emotions: No Bowel Continence: No Work: No Toileting: No Drive: No Dressing: No Hobbies: No Electronic Signature(s) Signed: 03/24/2020 4:26:09 PM By: Deon Pilling Signed: 03/24/2020 5:27:06 PM By: Levan Hurst RN, BSN Entered By: Deon Pilling on 03/24/2020 07:51:31 -------------------------------------------------------------------------------- Patient/Caregiver Education Details Patient Name: Date of Service: [place], A Viviann Spare 5/3/2021andnbsp7:30 A M Medical Record Number: [id number] Patient Account  Number: [id number] Date of Birth/Gender: Treating RN: [birth year] (34 y.o. [name] Primary Care Physician: Janine Limbo Other Clinician: Referring Physician: Treating Physician/Extender: [name]: 74 Education Assessment Education Provided To: Patient Education Topics Provided Wound/Skin Impairment: Methods: Explain/Verbal Responses: State content correctly [employer])  Signed: 03/24/2020 5:27:06 PM By: Levan Hurst RN, BSN Entered By: Levan Hurst on 03/24/2020 07:51:26 -------------------------------------------------------------------------------- Wound Assessment Details Patient Name: Date of Service: [place], [name] 03/24/2020 7:30 A M Medical Record Number: [id number] Patient Account Number: [id number] Date of Birth/Sex: Treating RN: [birth year] (34 y.o. [name] Primary Care Maurizio Geno: [place], [place] Other Clinician: Referring Helia Haese: Treating Zayleigh Stroh/Extender: [name]: 220 Wound Status Wound Number: 24 Primary Etiology: Inflammatory Wound Location: Left, Dorsal Foot Wound Status: Open Wounding Event: Gradually Appeared Comorbid History: Sleep Apnea, Hypertension, Paraplegia Date Acquired: 03/08/2018 Weeks Of Treatment: 106 Clustered Wound: Yes Wound Measurements Length: (cm) 1.5 Width: (cm) 1.5 Depth: (cm) 0.2 Clustered Quantity: 1 Area: (cm) 1. Volume: (cm) 0. % Reduction in Area: -648.7% % Reduction in Volume: -1370.8% Epithelialization: Medium (34-66%) Tunneling: No 767 Undermining: No 353 Wound Description Classification: Full Thickness Without Exposed Support Structures Wound Margin: Flat and Intact Exudate Amount: Medium Exudate Type: Serosanguineous Exudate Color: red, brown Foul Odor After Cleansing: No Slough/Fibrino Yes Wound Bed Granulation Amount: Large (67-100%) Exposed Structure Granulation Quality: Red Fascia Exposed:  No Necrotic Amount: Small (1-33%) Fat Layer (Subcutaneous Tissue) Exposed: Yes Necrotic Quality: Adherent Slough Tendon Exposed: No Muscle Exposed: No Joint Exposed: No Bone Exposed: No Treatment Notes Wound #24 (Left, Dorsal Foot) 1. Cleanse With Wound Cleanser Soap and water 3. Primary Dressing Applied Iodoflex 4. Secondary Dressing Dry Gauze 6. Support Layer Applied 4 layer compression wrap Electronic Signature(s) Signed: 03/24/2020 4:26:09 PM By: Deon Pilling Signed: 03/24/2020 5:27:06 PM By: Levan Hurst RN, BSN Entered By: Deon Pilling on 03/24/2020 08:02:38 -------------------------------------------------------------------------------- Wound Assessment Details Patient Name: Date of Service: [place], [name] 03/24/2020 7:30 A M Medical Record Number: [id number] Patient Account Number: [id number] Date of Birth/Sex: Treating RN: Mar 31, 1986 (34 y.o. [name] Primary Care Halona Amstutz: [name], [name] Other Clinician: Referring Luvia Orzechowski: Treating Seher Schlagel/Extender: Dutch Gray, [name]: 220 Wound Status Wound Number: 37 Primary Etiology: Venous Leg Ulcer Wound Location: Left, Lateral Malleolus Wound Status: Open Wounding Event: Gradually Appeared Comorbid History: Sleep Apnea, Hypertension, Paraplegia Date Acquired: 11/26/2019 Weeks Of Treatment: 17 Clustered Wound: No Wound Measurements Length: (cm) 3.4 Width: (cm) 1.2 Depth: (cm) 0.1 Area: (cm) 3.204 Volume: (cm) 0.32 % Reduction in Area: 19.1% % Reduction in Volume: 19.2% Epithelialization: Small (1-33%) Tunneling: No Undermining: No Wound Description Classification: Full Thickness Without Exposed Support Structures Wound Margin: Flat and Intact Exudate Amount: Medium Exudate Type: Serosanguineous Exudate Color: red, brown Foul Odor After Cleansing: No Slough/Fibrino No Wound Bed Granulation Amount: Large (67-100%) Exposed Structure Granulation Quality: Red Fascia  Exposed: No Necrotic Amount: None Present (0%) Fat Layer (Subcutaneous Tissue) Exposed: Yes Tendon Exposed: No Muscle Exposed: No Joint Exposed: No Bone Exposed: No Treatment Notes Wound #37 (Left, Lateral Malleolus) 1. Cleanse With Wound Cleanser Soap and water 2. Periwound Care Antifungal cream Barrier cream Moisturizing lotion TCA Cream 3. Primary Dressing Applied Hydrofera Blue 4. Secondary Dressing ABD Pad Dry Gauze 6. Support Layer Applied 4 layer compression wrap Notes antifungal cream to toes Electronic Signature(s) Signed: 03/24/2020 4:26:09 PM By: Deon Pilling Signed: 03/24/2020 5:27:06 PM By: Levan Hurst RN, BSN Entered By: Deon Pilling on 03/24/2020 08:03:14 -------------------------------------------------------------------------------- Wound Assessment Details Patient Name: Date of Service: [place], [name] 03/24/2020 7:30 A M Medical Record Number: [id number] Patient Account Number: [id number] Date of Birth/Sex: Treating RN: 03/21/1986 (34 y.o. [name] Primary Care Ancel Easler: [place], [place] Other Clinician: Referring Amit Leece: Treating Jamyrah Saur/Extender: Malachi Carl  [name]: 220 Wound Status Wound Number: 38 Primary Etiology: Inflammatory Wound Location: Right T - Web between 1st and 2nd oe Wound Status: Open Wounding Event: Gradually Appeared Comorbid History: Sleep Apnea, Hypertension, Paraplegia Date Acquired: 11/30/2019 Weeks Of Treatment: 16 Clustered Wound: No Wound Measurements Length: (cm) 0.5 Width: (cm) 0.2 Depth: (cm) 0.2 Area: (cm) 0.079 Volume: (cm) 0.016 % Reduction in Area: 76.1% % Reduction in Volume: 93.1% Epithelialization: Large (67-100%) Tunneling: No Undermining: No Wound Description Classification: Full Thickness Without Exposed Support Structures Wound Margin: Flat and Intact Exudate Amount: Small Exudate Type: Serosanguineous Exudate Color: red, brown Foul Odor After Cleansing:  No Slough/Fibrino No Wound Bed Granulation Amount: Large (67-100%) Exposed Structure Granulation Quality: Red Fascia Exposed: No Necrotic Amount: None Present (0%) Fat Layer (Subcutaneous Tissue) Exposed: Yes Tendon Exposed: No Muscle Exposed: No Joint Exposed: No Bone Exposed: No Treatment Notes Wound #38 (Right Toe - Web between 1st and 2nd) 1. Cleanse With Wound Cleanser 2. Periwound Care Antifungal cream 3. Primary Dressing Applied Calcium Alginate Ag 4. Secondary Dressing ABD Pad Dry Gauze 5. Secured With Tape Notes Development worker, international aid) Signed: 03/24/2020 4:26:09 PM By: Deon Pilling Signed: 03/24/2020 5:27:06 PM By: Levan Hurst RN, BSN Entered By: Deon Pilling on 03/24/2020 08:04:04 -------------------------------------------------------------------------------- Wound Assessment Details Patient Name: Date of Service: [place], [name] 03/24/2020 7:30 A M Medical Record Number: [id number] Patient Account Number: [id number] Date of Birth/Sex: Treating RN: 1986-08-28 (34 y.o. [name] Primary Care Masiah Lewing: [name], [name] Other Clinician: Referring Ronnett Pullin: Treating Odyssey Vasbinder/Extender: [name]: 220 Wound Status Wound Number: 40 Primary Etiology: Venous Leg Ulcer Wound Location: Left, Lateral Lower Leg Secondary Etiology: Lymphedema Wounding Event: Gradually Appeared Wound Status: Open Date Acquired: 02/11/2020 Comorbid History: Sleep Apnea, Hypertension, Paraplegia Weeks Of Treatment: 6 Clustered Wound: No Wound Measurements Length: (cm) 0.5 Width: (cm) 0.3 Depth: (cm) 0.1 Area: (cm) 0.118 Volume: (cm) 0.012 % Reduction in Area: 70% % Reduction in Volume: 69.2% Epithelialization: Medium (34-66%) Tunneling: No Undermining: No Wound Description Classification: Full Thickness Without Exposed Support Struct Wound Margin: Flat and Intact Exudate Amount: Small Exudate Type:  Serosanguineous Exudate Color: red, brown ures Foul Odor After Cleansing: No Slough/Fibrino Yes Wound Bed Granulation Amount: Large (67-100%) Exposed Structure Granulation Quality: Pink Fascia Exposed: No Necrotic Amount: Small (1-33%) Fat Layer (Subcutaneous Tissue) Exposed: Yes Necrotic Quality: Adherent Slough Tendon Exposed: No Muscle Exposed: No Joint Exposed: No Bone Exposed: No Treatment Notes Wound #40 (Left, Lateral Lower Leg) 1. Cleanse With Wound Cleanser Soap and water 2. Periwound Care Antifungal cream Barrier cream Moisturizing lotion TCA Cream 3. Primary Dressing Applied Hydrofera Blue 4. Secondary Dressing ABD Pad Dry Gauze 6. Support Layer Applied 4 layer compression wrap Notes antifungal cream to toes Electronic Signature(s) Signed: 03/24/2020 4:26:09 PM By: Deon Pilling Signed: 03/24/2020 5:27:06 PM By: Levan Hurst RN, BSN Signed: 03/24/2020 5:27:06 PM By: Levan Hurst RN, BSN Entered By: Deon Pilling on 03/24/2020 08:04:30 -------------------------------------------------------------------------------- Wound Assessment Details Patient Name: Date of Service: [place], [name] 03/24/2020 7:30 A M Medical Record Number: [id number] Patient Account Number: [id number] Date of Birth/Sex: Treating RN: [birth year] (34 y.o. [name] Primary Care Siddh Vandeventer: [place], [place] Other Clinician: Referring Cayli Escajeda: Treating Ezreal Turay/Extender: Dutch Gray, [name]: 220 Wound Status Wound Number: 41 Primary Etiology: Pressure Ulcer Wound Location: Left Ischium Wound Status: Open Wounding Event: Gradually Appeared Comorbid History: Sleep Apnea, Hypertension, Paraplegia Date Acquired: 03/16/2020 Weeks Of Treatment: 1 Clustered Wound: No Wound Measurements  Length: (cm) 0.1 Width: (cm) 0.1 Depth: (cm) 0.1 Area: (cm) 0.008 Volume: (cm) 0.001 % Reduction in Area: 99.6% % Reduction in Volume: 99.4% Epithelialization: Large  (67-100%) Tunneling: No Undermining: No Wound Description Classification: Category/Stage II Wound Margin: Flat and Intact Exudate Amount: Medium Exudate Type: Serosanguineous Exudate Color: red, brown Foul Odor After Cleansing: No Slough/Fibrino No Wound Bed Granulation Amount: Large (67-100%) Exposed Structure Granulation Quality: Red Fascia Exposed: No Necrotic Amount: None Present (0%) Fat Layer (Subcutaneous Tissue) Exposed: Yes Tendon Exposed: No Muscle Exposed: No Joint Exposed: No Bone Exposed: No Treatment Notes 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 Cleanse With Wound Cleanser 2. Periwound Care Skin Prep 3. Primary Dressing Applied Calcium Alginate Ag 4. Secondary Dressing ABD Pad Dry Gauze 5. Secured With Recruitment consultant) Signed: 03/24/2020 5:27:06 PM By: Levan Hurst RN, BSN Entered By: Levan Hurst on 03/24/2020 08:29:41 -------------------------------------------------------------------------------- Artas Details Patient Name: Date of Service: [place], [name] 03/24/2020 7:30 A M Medical Record Number: [id number] Patient Account Number: [id number] Date of Birth/Sex: Treating RN: 09/25/1986 (34 y.o. [name] Primary Care Elanie Hammitt: [name], [name] Other Clinician: Referring Makaiya Geerdes: Treating Taegen Lennox/Extender: Dutch Gray, [name]: 220 Vital Signs Time Taken: 07:46 Temperature (F): 98.1 Height (in): 70 Pulse (bpm): 104 Weight (lbs): 216 Respiratory Rate (breaths/min): 18 Body Mass Index (BMI): 31 Blood Pressure (mmHg): 113/63 Reference Range: 80 - 120 mg / dl Electronic Signature(s) Signed: 03/24/2020 4:26:09 PM By: Deon Pilling Entered By: Deon Pilling on 03/24/2020 07:51:20

## 2020-03-24 NOTE — Progress Notes (Signed)
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 dressing attempts. He also has areas in his first and second toes bilaterally in the anterior and posterior web space. I had been using Iodoflex here after a prolonged course of silver alginate with ketoconazole was ineffective [question tinea pedis] 07/14/17; patient arrives today with a very difficult adherent material over his left lateral lower leg wound. He also has surrounding erythema and poorly controlled edema. He was switched his Santyl last visit which the nurses are applying once during his doctor visit and once on a nurse visit. He was also reduced to 2 layer compression I'm not exactly sure of the issue here. 07/21/17; better surface today after 1 week of Iodoflex. Significant cellulitis that we treated last week also better. [Doxycycline] 07/28/17 better surface today with now 2 weeks of Iodoflex. Significant cellulitis treated with doxycycline. He has  now completed the doxycycline and he is back to his usual degree of chronic venous inflammation/stasis dermatitis. He reminds me he has had ablations surgery here 08/04/17; continued improvement with Iodoflex to the left lateral leg wound in terms of the surface of the  wound although the dimensions are better. 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 Problematic areas on the plantar aspect of the first second toe web space on the left and the dorsal aspect of the first second toe web space on the right. At one point I felt these were probably related to chronic fungal infections in treated him aggressively for this although we have not made any improvement here. 08/11/17; left lateral leg. Surface continues to improve with the Iodoflex although we are not seeing much improvement in overall wound dimensions. Areas on his plantar left foot and right foot show no improvement. In fact the right foot looks somewhat worse 08/18/17; left lateral leg. We changed to [facility] Blue last week after a prolonged course of Iodoflex which helps get the surface better. It appears that the wound with is improved. Continue with difficult areas on the left dorsal first second and plantar first second on the right 09/01/17; patient arrives in clinic today having had a temperature of 103 yesterday. He was seen in the ER and [facility]. The patient was concerned he could have cellulitis again in the right leg however they diagnosed him with a UTI and he is now on Keflex. He has a history of cellulitis which is been recurrent and difficult but this is been in the left leg, in the past 5 use doxycycline. He does in and out catheterizations at home which are risk factors for UTI 09/08/17; patient will be completing his Keflex this weekend. The erythema on the left leg is considerably better. He has a new wound today on the medial part of the right leg small superficial almost looks like a skin tear. He has worsening of the area on the right dorsal first and second toe. His major area on the left lateral leg is better. Using Hydrofera Blue on all areas 09/15/17; gradual reduction in width on the long wound in the left  lateral leg. No debridement required. He also has wounds on the plantar aspect of his left first second toe web space and on the dorsal aspect of the right first second toe web space. 09/22/17; there continues to be very gradual improvements in the dimensions of the left lateral leg wound. He hasn't round erythematous spot with might be pressure on his wheelchair. There is no evidence obviously of infection no purulence no warmth He has a dry scaled area on the plantar aspect of the left first second toe Improved area on the dorsal right first second toe. 09/29/17; left lateral leg wound continues to improve in dimensions mostly with an is still a fairly long but increasingly narrow wound. He has a dry scaled area on the plantar aspect of his left first second toe web space Increasingly concerning area on the dorsal right first second toe. In fact I am concerned today about possible cellulitis around this wound. The areas extending up his second toe and although there is deformities here almost appears to abut on the nailbed. 10/06/17; left lateral leg wound continues to make very gradual progress. Tissue culture I did from the right first second toe dorsal foot last time grew MRSA and enterococcus which was vancomycin sensitive. This  was not sensitive to clindamycin or doxycycline. He is allergic to Zyvox and sulfa we have therefore arrange for him to have dalvance infusion tomorrow. He is had this in the past and tolerated it well 10/20/17; left lateral leg wound continues to make decent progress. This is certainly reduced in terms of with there is advancing epithelialization.The cellulitis in the right foot looks better although he still has a deep wound in the dorsal aspect of the first second toe web space. Plantar left first toe web space on the left I think is making some progress 10/27/17; left lateral leg wound continues to make decent progress. Advancing epithelialization.using Hydrofera  Blue The right first second toe web space wound is better-looking using silver alginate Improvement in the left plantar first second toe web space. Again using silver alginate 11/03/17 left lateral leg wound continues to make decent progress albeit slowly. Using [facility] The right per second toe web space continues to be a very problematic looking punched out wound. I obtained a piece of tissue for deep culture I did extensively treated this for fungus. It is difficult to imagine that this is a pressure area as the patient states other than going outside he doesn't really wear shoes at home The left plantar first second toe web space looked fairly senescent. Necrotic edges. This required debridement change to [facility] Blue to all wound areas 11/10/17; left lateral leg wound continues to contract. Using Hydrofera Blue On the right dorsal first second toe web space dorsally. Culture I did of this area last week grew MRSA there is not an easy oral option in this patient was multiple antibiotic allergies or intolerances. This was only a rare culture isolate I'm therefore going to use Bactroban under silver alginate On the left plantar first second toe web space. Debridement is required here. This is also unchanged 11/17/17; left lateral leg wound continues to contract using Hydrofera Blue this is no longer the major issue. The major concern here is the right first second toe web space. He now has an open area going from dorsally to the plantar aspect. There is now wound on the inner lateral part of the first toe. Not a very viable surface on this. There is erythema spreading medially into the forefoot. No major change in the left first second toe plantar wound 11/24/17; left lateral leg wound continues to contract using Hydrofera Blue. Nice improvement today The right first second toe web space all of this looks a lot less angry than last week. I have given him clindamycin and topical Bactroban  for MRSA and terbinafine for the possibility of underlining tinea pedis that I could not control with ketoconazole. Looks somewhat better The area on the plantar left first second toe web space is weeping with dried debris around the wound 12/01/17; left lateral leg wound continues to contract he Hydrofera Blue. It is becoming thinner in terms of with nevertheless it is making good improvement. The right first second toe web space looks less angry but still a large necrotic-looking wounds starting on the plantar aspect of the right foot extending between the toes and now extensively on the base of the right second toe. I gave him clindamycin and topical Bactroban for MRSA anterior benefiting for the possibility of underlying tinea pedis. Not looking better today The area on the left first/second toe looks better. Debrided of necrotic debris 12/05/17* the patient was worked in urgently today because over the weekend he found blood on his incontinence bad  when he woke up. He was found to have an ulcer by his wife who does most of his wound care. He came in today for [place] to look at this. He has not had a history of wounds in his buttocks in spite of his paraplegia. 12/08/17; seen in follow-up today at his usual appointment. He was seen earlier this week and found to have a new wound on his buttock. We also follow him for wounds on the left lateral leg, left first second toe web space and right first second toe web space 12/15/17; we have been using Hydrofera Blue to the left lateral leg which has improved. The right first second toe web space has also improved. Left first second toe web space plantar aspect looks stable. The left buttock has worsened using Santyl. Apparently the buttock has drainage 12/22/17; we have been using Hydrofera Blue to the left lateral leg which continues to improve now 2 small wounds separated by normal skin. He tells [place] he had a fever up to 100 yesterday he is prone to UTIs but  has not noted anything different. He does in and out catheterizations. The area between the first and second toes today does not look good necrotic surface covered with what looks to be purulent drainage and erythema extending into the third toe. I had gotten this to something that I thought look better last time however it is not look good today. He also has a necrotic surface over the buttock wound which is expanded. I thought there might be infection under here so I removed a lot of the surface with a #5 curet though nothing look like it really needed culturing. He is been using Santyl to this area 12/27/17; his original wound on the left lateral leg continues to improve using Hydrofera Blue. I gave him samples of Baxdella although he was unable to take them out of fear for an allergic reaction ["lump in his throat"].the culture I did of the purulent drainage from his second toe last week showed both enterococcus and a set Enterobacter I was also concerned about the erythema on the bottom of his foot although paradoxically although this looks somewhat better today. 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 We've been using silver alginate here. They came up today that he sleeps in a recliner, I'm not sure why but I asked him to stop this 01/03/18; his original wound we've been using Hydrofera Blue is now separated into 2 areas. Ulcer on his left buttock is better he is off the recliner and sleeping in bed Finally both wound areas between his first and second toes also looks some better 01/10/18; his original wound on the left lateral leg is now separated into 2 wounds we've been using Hydrofera Blue Ulcer on his left buttock has some drainage. There is a small probing site going into muscle layer superiorly.using silver alginate -He arrives today with a deep tissue injury on the left heel The wound on the  dorsal aspect of his first second toe on the left looks a lot betterusing silver alginate ketoconazole The area on the first second toe web space on the right also looks a lot bette 01/17/18; his original wound on the left lateral leg continues to progress using Hydrofera Blue Ulcer on his left buttock also is smaller surface healthier except for a small probing site going into the muscle layer superiorly. 2.4 cm  of tunneling in this area DTI on his left heel we have only been offloading. Looks better than last week no threatened open no evidence of infection the wound on the dorsal aspect of the first second toe on the left continues to look like it's regressing we have only been using silver alginate and terbinafine orally The area in the first second toe web space on the right also looks to be a lot better using silver alginate and terbinafine I think this was prompted by tinea pedis 01/31/18; the patient was hospitalized in [place] last week apparently for a complicated UTI. He was discharged on cefepime he does in and out catheterizations. In the hospital he was discovered M I don't mild elevation of AST and ALT and the terbinafine was stopped.predictably the pressure ulcer on s his buttock looks betterusing silver alginate. The area on the left lateral leg also is better using Hydrofera Blue. The area between the first and second toes on the left better. First and second toes on the right still substantial but better. Finally the DTI on the left heel has held together and looks like it's resolving 02/07/18-he is here in follow-up evaluation for multiple ulcerations. He has new injury to the lateral aspect of the last issue a pressure ulcer, he states this is from adhesive removal trauma. He states he has tried multiple adhesive products with no success. All other ulcers appear stable. The left heel DTI is resolving. We will continue with same treatment plan and follow-up next week. 02/14/18;  follow-up for multiple areas. He has a new area last week on the lateral aspect of his pressure ulcer more over the posterior trochanter. The original pressure ulcer looks quite stable has healthy granulation. 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 Almost fully epithelialized on the original superior area using Hydrofera Blue DTI on the left heel has peeled off this week to reveal a small superficial wound under denuded skin and subcutaneous tissue Both areas between the first and second toes look better including nothing open on the left 02/21/18; The patient's wounds on his left ischial tuberosity and posterior left greater trochanter actually looked better. He has a large area of irritation around the area which I think is contact dermatitis. I am doubtful that this is fungal His original wound on the left lateral calf continues to improve we have been using Hydrofera Blue There is no open area in the left first second toe web space although there is a lot of thick callus The DTI on the left heel required debridement today of necrotic surface eschar and subcutaneous tissue using silver alginate Finally the area on the right first second toe webspace continues to contract using silver alginate and ketoconazole 02/28/18 Left ischial tuberosity wounds look better using silver alginate. Original wound on the left calf only has one small open area left using Hydrofera Blue DTI on the left heel required debridement mostly removing skin from around this wound surface. Using silver alginate The areas on the right first/second toe web space using silver alginate and ketoconazole 03/08/18 on evaluation today patient appears to be doing decently well as best I can tell in regard to his wounds. This is the first time that I have seen him as he generally is followed by Dr. Dellia Nims. With that being said  none of his wounds appear to be infected he does have an area where  there is some skin covering what appears to be a new wound on the left dorsal surface of his great toe. This is right at the nail bed. With that being said I do believe that debrided away some of the excess skin can be of benefit in this regard. Otherwise he has been tolerating the dressing changes without complication. 03/14/18; patient arrives today with the multiplicity of wounds that we are following. He has not been systemically unwell Original wound on the left lateral calf now only has 2 small open areas we've been using Hydrofera Blue which should continue The deep tissue injury on the left heel requires debridement today. We've been using silver alginate The left first second toe and the right first second toe are both are reminiscence what I think was tinea pedis. Apparently some of the callus Surface between the toes was removed last week when it started draining. Purulent drainage coming from the wound on the ischial tuberosity on the left. 03/21/18-He is here in follow-up evaluation for multiple wounds. 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 He tolerated debridement. The only change to last week's recommendations is to discontinue antifungal cream between toes. He will follow-up next week 03/28/18; following up for multiple wounds;Concern this week is streaking redness and swelling in the right foot. He is going to need antibiotics for this. 03/31/18; follow-up for right foot cellulitis. Streaking redness and swelling in the right foot on 03/28/18. 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 I put him on clindamycin 300 mg every 6 and brought him in for a quick check. He has an open wound between his first and second toes on the right foot as a potential source. 04/04/18; Right foot cellulitis is resolving he is completing  clindamycin. This is truly good news Left lateral calf wound which is initial wound only has one small open area inferiorly this is close to healing out. He has compression stockings. We will use Hydrofera Blue right down to the epithelialization of this Nonviable surface on the left heel which was initially pressure with a DTI. We've been using Hydrofera Blue. I'm going to switch this back to silver alginate Left first second toe/tinea pedis this looks better using silver alginate Right first second toe tinea pedis using silver alginate Large pressure ulcers on theLeft ischial tuberosity. Small wound here Looks better. I am uncertain about the surface over the large wound. Using silver alginate 04/11/18; Cellulitis in the right foot is resolved Left lateral calf wound which was his original wounds still has 2 tiny open areas remaining this is just about closed Nonviable surface on the left heel is better but still requires debridement Left first second toe/tinea pedis still open using silver alginate Right first second toe wound tinea pedis I asked him to go back to using ketoconazole and silver alginate Large pressure ulcers on the left ischial tuberosity this shear injury here is resolved. Wound is smaller. No evidence of infection using silver alginate 04/18/18; Patient arrives with an intense area of cellulitis in the right mid lower calf extending into the right heel area. Bright red and warm. Smaller area on the left anterior leg. He has a significant history of MRSA. He will definitely need antibioticsdoxycycline He now has 2 open areas on the left ischial tuberosity the original large wound and now a satellite area which I think was above his initial satellite areas. Not a wonderful surface on this  satellite area surrounding erythema which looks like pressure related. His left lateral calf wound again his original wound is just about closed Left heel pressure injury still requiring  debridement Left first second toe looks a lot better using silver alginate Right first second toe also using silver alginate and ketoconazole cream also looks better 04/20/18; the patient was worked in early today out of concerns with his cellulitis on the right leg. I had started him on doxycycline. This was 2 days ago. His wife was concerned about the swelling in the area. Also concerned about the left buttock. He has not been systemically unwell no fever chills. No nausea vomiting or diarrhea 04/25/18; the patient's left buttock wound is continued to deteriorate he is using Hydrofera Blue. He is still completing clindamycin for the cellulitis on the right leg although all of this looks better. 05/02/18 Left buttock wound still with a lot of drainage and a very tightly adherent fibrinous necrotic surface. He has a deeper area superiorly The left lateral calf wound is still closed DTI wound on the left heel necrotic surface especially the circumference using Iodoflex Areas between his left first second toe and right first second toe both look better. Dorsally and the right first second toe he had a necrotic surface although at smaller. In using silver alginate and ketoconazole. I did a culture last week which was a deep tissue culture of the reminiscence of the open wound on the right first second toe dorsally. This grew a few Acinetobacter and a few methicillin-resistant staph aureus. Nevertheless the area actually this week looked better. I didn't feel the need to specifically address this at least in terms of systemic antibiotics. 05/09/18; wounds are measuring larger more drainage per our intake. We are using Santyl covered with alginate on the large superficial buttock wounds, Iodosorb on the left heel, ketoconazole and silver alginate to the dorsal first and second toes bilaterally. 05/16/18; The area on his left buttock better in some aspects although the area superiorly over the ischial  tuberosity required an extensive debridement.using Santyl Left heel appears stable. Using Iodoflex The areas between his first and second toes are not bad however there is spreading erythema up the dorsal aspect of his left foot this looks like cellulitis again. He is insensate the erythema is really very brilliant.o Erysipelas He went to see an allergist days ago because he was itching part of this he had lab work done. This showed a white count of 15.1 with 70% neutrophils. Hemoglobin of 11.4 and a platelet count of 659,000. Last white count we had in Epic was a 2-1/2 years ago which was 25.9 but he was ill at the time. He was able to show me some lab work that was done by his primary physician the pattern is about the same. 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 But prompted me to go ahead and do x-rays of both feet and the pelvis rule out osteomyelitis. He also had a comprehensive metabolic panel this was reasonably normal his albumin was 3.7 liver function tests BUN/creatinine all normal 05/23/18; x-rays of both his feet from last week were negative for underlying pulmonary abnormality. The x-ray of his pelvis however showed mild irregularity in the left ischial which may represent some early osteomyelitis. The wound in the left ischial continues to get deeper clearly now exposed muscle. Each week necrotic surface material over this area. Whereas the rest of the wounds do  not look so bad. The left ischial wound we have been using Santyl and calcium alginate T the left heel surface necrotic debris using Iodoflex o The left lateral leg is still healed Areas on the left dorsal foot and the right dorsal foot are about the same. There is some inflammation on the left which might represent contact dermatitis, fungal dermatitis I am doubtful cellulitis although this looks better than last week 05/30/18; CT scan done at Hospital did not show any  osteomyelitis or abscess. Suggested the possibility of underlying cellulitis although I don't see a lot of evidence of this at the bedside The wound itself on the left buttock/upper thigh actually looks somewhat better. No debridement Left heel also looks better no debridement continue Iodoflex Both dorsal first second toe spaces appear better using Lotrisone. Left still required debridement 06/06/18; Intake reported some purulent looking drainage from the left gluteal wound. Using Santyl and calcium alginate Left heel looks better although still a nonviable surface requiring debridement The left dorsal foot first/second webspace actually expanding and somewhat deeper. I may consider doing a shave biopsy of this area Right dorsal foot first/second webspace appears stable to improved. Using Lotrisone and silver alginate to both these areas 06/13/18 Left gluteal surface looks better. Now separated in the 2 wounds. No debridement required. Still drainage. We'll continue silver alginate Left heel continues to look better with Iodoflex continue this for at least another week Of his dorsal foot wounds the area on the left still has some depth although it looks better than last week. We've been using Lotrisone and silver alginate 06/20/18 Left gluteal continues to look better healthy tissue Left heel continues to look better healthy granulation wound is smaller. He is using Iodoflex and his long as this continues continue the Iodoflex Dorsal right foot looks better unfortunately dorsal left foot does not. There is swelling and erythema of his forefoot. He had minor trauma to this several days ago but doesn't think this was enough to have caused any tissue injury. Foot looks like cellulitis, we have had this problem before 06/27/18 on evaluation today patient appears to be doing a little worse in regard to his foot ulcer. 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 Both of which would really be his only options for treating this infection. In the past he has been given and effusion of Orbactiv. This is done very well for him in the past again it's one time dosing IV antibiotic therapy. Subsequently I do believe this is something we're gonna need to see about doing at this point in time. Currently his other wounds seem to be doing somewhat better in my pinion I'm pretty happy in that regard. 07/03/18 on evaluation today patient's wounds actually appear to be doing fairly well. He has been tolerating the dressing changes without complication. All in all he seems to be showing signs of improvement. In regard to the antibiotics he has been dealing with infectious disease since I saw him last week as far as getting this scheduled. In the end [facility]'s [facility] this coming Friday. In the meantime he has been continuing to perform the dressing changes in such as previous. There does not appear to be any evidence of infection worsengin at this time. 07/10/18; Since I last saw  this man 2 weeks ago things have actually improved. IV antibiotics of resulted in less forefoot erythema although there is still some present. He is not systemically unwell Left buttock wounds 2 now have no depth there is increased epithelialization Using silver alginate Left heel still requires debridement using Iodoflex Left dorsal foot still with a sizable wound about the size of a border but healthy granulation Right dorsal foot still with a slitlike area using silver alginate 07/18/18; the patient's cellulitis in the left foot is improved in fact I think it is on its way to resolving. Left buttock wounds 2 both look better although the larger one has hypertension granulation we've been using silver alginate Left heel has some thick  circumferential redundant skin over the wound edge which will need to be removed today we've been using Iodoflex Left dorsal foot is still a sizable wound required debridement using silver alginate The right dorsal foot is just about closed only a small open area remains here 07/25/18; left foot cellulitis is resolved Left buttock wounds 2 both look better. Hyper-granulation on the major area Left heel as some debris over the surface but otherwise looks a healthier wound. Using silver collagen Right dorsal foot is just about closed 07/31/18; arrives with our intake nurse worried about purulent drainage from the buttock. We had hyper-granulation here last week His buttock wounds 2 continue to look better Left heel some debris over the surface but measuring smaller. Right dorsal foot unfortunately has openings between the toes Left foot superficial wound looks less aggravated. 08/07/18 Buttock wounds continue to look better although some of her granulation and the larger medial wound. silver alginate Left heel continues to look a lot better.silver collagen Left foot superficial wound looks less stable. Requires debridement. He has a new wound superficial area on the foot on the lateral dorsal foot. Right foot looks better using silver alginate without Lotrisone 08/14/2018; patient was in the ER last week diagnosed with a UTI. He is now on Cefpodoxime and Macrodantin. Buttock wounds continued to be smaller. Using silver alginate Left heel continues to look better using silver collagen Left foot superficial wound looks as though it is improving Right dorsal foot area is just about healed. 08/21/2018; patient is completed his antibiotics for his UTI. He has 2 open areas on the buttocks. There is still not closed although the surface looks satisfactory. Using silver alginate Left heel continues to improve using silver collagen The bilateral dorsal foot areas which are at the base of his first and second  toes/possible tinea pedis are actually stable on the left but worse on the right. The area on the left required debridement of necrotic surface. After debridement I obtained a specimen for PCR culture. The right dorsal foot which is been just about healed last week is now reopened 08/28/2018; culture done on the left dorsal foot showed coag negative staph both staph epidermidis and Lugdunensis. I think this is worthwhile initiating systemic treatment. I will use doxycycline given his long list of allergies. The area on the left heel slightly improved but still requiring debridement. The large wound on the buttock is just about closed whereas the smaller one is larger. Using silver alginate in this area 09/04/2018; patient is completing his doxycycline for the left foot although this continues to be a very difficult wound area with very adherent necrotic debris. We are using silver alginate to all his wounds right foot left foot and the small wounds on his buttock, silver collagen on  the left heel. 09/11/2018; once again this patient has intense erythema and swelling of the left forefoot. Lesser degrees of erythema in the right foot. He has a long list of allergies and intolerances. I will reinstitute doxycycline. 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 Using silver alginate Left heel also looks better using silver collagen Unfortunately both the areas on his feet look worse. The area on the left first second webspace is now gone through to the plantar part of his foot. The area on the left foot anteriorly is irritated with erythema and swelling in the forefoot. 09/25/2018 His wound on the left plantar heel looks better. Using silver collagen The area on the left buttock 2 small remnant areas. One is closed one is still open. Using silver alginate The areas between both his first and second toes look worse. This in spite of long-standing antifungal  therapy with ketoconazole and silver alginate which should have antifungal activity He has small areas around his original wound on the left calf one is on the bottom of the original scar tissue and one superiorly both of these are small and superficial but again given wound history in this site this is worrisome 10/02/2018 Left plantar heel continues to gradually contract using silver collagen Left buttock wound is unchanged using silver alginate The areas on his dorsal feet between his first and second toes bilaterally look about the same. I prescribed clindamycin ointment to see if we can address chronic staph colonization and also the underlying possibility of erythrasma The left lateral lower extremity wound is actually on the lateral part of his ankle. Small open area here. We have been using silver alginate 10/09/2018; Left plantar heel continues to look healthy and contract. No debridement is required Left buttock slightly smaller with a tape injury wound just below which was new this week Dorsal feet somewhat improved I have been using clindamycin Left lateral looks lower extremity the actual open area looks worse although a lot of this is epithelialized. I am going to change to silver collagen today He has a lot more swelling in the right leg although this is not pitting not red and not particularly warm there is a lot of spasm in the right leg usually indicative of people with paralysis of some underlying discomfort. We have reviewed his vascular status from 2017 he had a left greater saphenous vein ablation. I wonder about referring him back to vascular surgery if the area on the left leg continues to deteriorate. 10/16/2018 in today for follow-up and management of multiple lower extremity ulcers. His left Buttock wound is much lower smaller and almost closed completely. The wound to the left ankle has began to reopen with Epithelialization and some adherent slough. He has multiple  new areas to the left foot and leg. The left dorsal foot without much improvement. Wound present between left great webspace and 2nd toe. Erythema and edema present right leg. Right LE ultrasound obtained on 10/10/18 was negative for DVT . 10/23/2018; Left buttock is closed over. Still dry macerated skin but there is no open wound. I suspect this is chronic pressure/moisture Left lateral calf is quite a bit worse than when I saw this last. There is clearly drainage here he has macerated skin into the left plantar heel. We will change the primary dressing to alginate Left dorsal foot has some improvement in overall wound area. Still using clindamycin and silver alginate Right dorsal  foot about the same as the left using clindamycin and silver alginate The erythema in the right leg has resolved. He is DVT rule out was negative Left heel pressure area required debridement although the wound is smaller and the surface is health 10/26/2018 The patient came back in for his nurse check today predominantly because of the drainage coming out of the left lateral leg with a recent reopening of his original wound on the left lateral calf. He comes in today with a large amount of surrounding erythema around the wound extending from the calf into the ankle and even in the area on the dorsal foot. He is not systemically unwell. He is not febrile. Nevertheless this looks like cellulitis. We have been using silver alginate to the area. I changed him to a regular visit and I am going to prescribe him doxycycline. The rationale here is a long list of medication intolerances and a history of MRSA. I did not see anything that I thought would provide a valuable culture 10/30/2018 Follow-up from his appointment 4 days ago with really an extensive area of cellulitis in the left calf left lateral ankle and left dorsal foot. I put him on doxycycline. He has a long list of medication allergies which are true allergy reactions.  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 In any case he is a lot better today. The erythema especially in the anterior and lateral left calf is better. He still has left ankle erythema. He also is complaining about increasing edema in the right leg we have only been using Kerlix Coban and he has been doing the wraps at home. Finally he has a spotty rash on the medial part of his upper left calf which looks like folliculitis or perhaps wrap occlusion type injury. Small superficial macules not pustules 11/06/18 patient arrives today with again a considerable degree of erythema around the wound on the left lateral calf extending into the dorsal ankle and dorsal foot. This is a lot worse than when I saw this last week. He is on doxycycline really with not a lot of improvement. He has not been systemically unwell Wounds on the; left heel actually looks improved. Original area on the left foot and proximity to the first and second toes looks about the same. He has superficial areas on the dorsal foot, anterior calf and then the reopening of his original wound on the left lateral calf which looks about the same The only area he has on the right is the dorsal webspace first and second which is smaller. He has a large area of dry erythematous skin on the left buttock small open area here. 11/13/2018; the patient arrives in much better condition. The erythema around the wound on the left lateral calf is a lot better. Not sure whether this was the clindamycin or the TCA and ketoconazole or just in the improvement in edema control [stasis dermatitis]. In any case this is a lot better. The area on the left heel is very small and just about resolved using silver collagen we have been using silver alginate to the areas on his dorsal feet 11/20/2018; his wounds include the left lateral calf, left heel, dorsal aspects of both feet just proximal to  the first second webspace. He is stable to slightly improved. I did not think any changes to his dressings were going to be necessary 11/27/2018 he has a reopening on the left buttock  which is surrounded by what looks like tinea or perhaps some other form of dermatitis. The area on the left dorsal foot has some erythema around it I have marked this area but I am not sure whether this is cellulitis or not. Left heel is not closed. Left calf the reopening is really slightly longer and probably worse 1/13; in general things look better and smaller except for the left dorsal foot. Area on the left heel is just about closed, left buttock looks better only a small wound remains in the skin looks better [using Lotrisone] 1/20; the area on the left heel only has a few remaining open areas here. Left lateral calf about the same in terms of size, left dorsal foot slightly larger right lateral foot still not closed. The area on the left buttock has no open wound and the surrounding skin looks a lot better 1/27; the area on the left heel is closed. Left lateral calf better but still requiring extensive debridements. The area on his left buttock is closed. He still has the open areas on the left dorsal foot which is slightly smaller in the right foot which is slightly expanded. We have been using Iodoflex on these areas as well 2/3; left heel is closed. Left lateral calf still requiring debridement using Iodoflex there is no open area on his left buttock however he has dry scaly skin over a large area of this. Not really responding well to the Lotrisone. Finally the areas on his dorsal feet at the level of the first second webspace are slightly smaller on the right and about the same on the left. Both of these vigorously debrided with Anasept and gauze 2/10; left heel remains closed he has dry erythematous skin over the left buttock but there is no open wound here. Left lateral leg has come in and with.  Still requiring debridement we have been using Iodoflex here. Finally the area on the left dorsal foot and right dorsal foot are really about the same extremely dry callused fissured areas. He does not yet have a dermatology appointment 2/17; left heel remains closed. He has a new open area on the left buttock. The area on the left lateral calf is bigger longer and still covered in necrotic debris. No major change in his foot areas bilaterally. I am awaiting for a dermatologist to look on this. We have been using ketoconazole I do not know that this is been doing any good at all. 2/24; left heel remains closed. The left buttock wound that was new reopening last week looks better. The left lateral calf appears better also although still requires debridement. The major area on his foot is the left first second also requiring debridement. We have been putting Prisma on all wounds. I do not believe that the ketoconazole has done too much good for his feet. He will use Lotrisone I am going to give him a 2-week course of terbinafine. We still do not have a dermatology appointment 3/2 left heel remains closed however there is skin over bone in this area I pointed this out to him today. The left buttock wound is epithelialized but still does not look completely stable. The area on the left leg required debridement were using silver collagen here. With regards to his feet we changed to Lotrisone last week and silver alginate. 3/9; left heel remains closed. Left buttock remains closed. The area on the right foot is essentially closed. The left foot remains unchanged. Slightly smaller on  the left lateral calf. Using silver collagen to both of these areas 3/16-Left heel remains closed. Area on right foot is closed. Left lateral calf above the lateral malleolus open wound requiring debridement with easy bleeding. Left dorsal wound proximal to first toe also debrided. Left ischial area open new. Patient has been  using Prisma with wrapping every 3 days. Dermatology appointment is apparently tomorrow.Patient has completed his terbinafine 2-week course with some apparent improvement according to him, there is still flaking and dry skin in his foot on the left 3/23; area on the right foot is reopened. The area on the left anterior foot is about the same still a very necrotic adherent surface. He still has the area on the left leg and reopening is on the left buttock. He apparently saw dermatology although I do not have a note. According to the patient who is usually fairly well informed they did not have any good ideas. Put him on oral terbinafine which she is been on before. 3/30; using silver collagen to all wounds. Apparently his dermatologist put him on doxycycline and rifampin presumably some culture grew staph. I do not have this result. He remains on terbinafine although I have used terbinafine on him before 4/6; patient has had a fairly substantial reopening on the right foot between the first and second toes. He is finished his terbinafine and I believe is on doxycycline and rifampin still as prescribed by dermatology. We have been using silver collagen to all his wounds although the patient reports that he thinks silver alginate does better on the wounds on his buttock. 4/13; the area on his left lateral calf about the same size but it did not require debridement. Left dorsal foot just proximal to the webspace between the first and second toes is about the same. Still nonviable surface. I note some superficial bronze discoloration of the dorsal part of his foot Right dorsal foot just proximal to the first and second toes also looks about the same. I still think there may be the same discoloration I noted above on the left Left buttock wound looks about the same 4/20; left lateral calf appears to be gradually contracting using silver collagen. He remains on erythromycin empiric treatment for possible  erythrasma involving his digital spaces. The left dorsal foot wound is debrided of tightly adherent necrotic debris and really cleans up quite nicely. The right area is worse with expansion. I did not debride this it is now over the base of the second toe The area on his left buttock is smaller no debridement is required using silver collagen 5/4; left calf continues to make good progress. He arrives with erythema around the wounds on his dorsal foot which even extends to the plantar aspect. Very concerning for coexistent infection. He is finished the erythromycin I gave him for possible erythrasma this does not seem to have helped. The area on the left foot is about the same base of the dorsal toes Is area on the buttock looks improved on the left 5/11; left calf and left buttock continued to make good progress. Left foot is about the same to slightly improved. Major problem is on the right foot. He has not had an x-ray. Deep tissue culture I did last week showed both Enterobacter and E. coli. I did not change the doxycycline I put him on empirically although neither 1 of these were plated to doxycycline. He arrives today with the erythema looking worse on both the dorsal and plantar foot.  Macerated skin on the bottom of the foot. he has not been systemically unwell 5/18-Patient returns at 1 week, left calf wound appears to be making some progress, left buttock wound appears slightly worse than last time, left foot wound looks slightly better, right foot redness is marginally better. X-ray of both feet show no air or evidence of osteomyelitis. Patient is finished his Omnicef and terbinafine. He continues to have macerated skin on the bottom of the left foot as well as right 5/26; left calf wound is better, left buttock wound appears to have multiple small superficial open areas with surrounding macerated skin. X-rays that I did last time showed no evidence of osteomyelitis in either foot. He is  finished cefdinir and doxycycline. I do not think that he was on terbinafine. He continues to have a large superficial open area on the right foot anterior dorsal and slightly between the first and second toes. I did send him to dermatology 2 months ago or so wondering about whether they would do a fungal scraping. I do not believe they did but did do a culture. We have been using silver alginate to the toe areas, he has been using antifungals at home topically either ketoconazole or Lotrisone. We are using silver collagen on the left foot, silver alginate on the right, silver collagen on the left lateral leg and silver alginate on the left buttock 6/1; left buttock area is healed. We have the left dorsal foot, left lateral leg and right dorsal foot. We are using silver alginate to the areas on both feet and silver collagen to the area on his left lateral calf 6/8; the left buttock apparently reopened late last week. He is not really sure how this happened. He is tolerating the terbinafine. Using silver alginate to all wounds 6/15; left buttock wound is larger than last week but still superficial. Came in the clinic today with a report of purulence from the left lateral leg I did not identify any infection Both areas on his dorsal feet appear to be better. He is tolerating the terbinafine. Using silver alginate to all wounds 6/22; left buttock is about the same this week, left calf quite a bit better. His left foot is about the same however he comes in with erythema and warmth in the right forefoot once again. Culture that I gave him in the beginning of May showed Enterobacter and E. coli. I gave him doxycycline and things seem to improve although neither 1 of these organisms was specifically plated. 6/29; left buttock is larger and dry this week. Left lateral calf looks to me to be improved. Left dorsal foot also somewhat improved right foot completely unchanged. The erythema on the right foot is  still present. He is completing the Ceftin dinner that I gave him empirically [see discussion above.) 7/6 - All wounds look to be stable and perhaps improved, the left buttock wound is slightly smaller, per patient bleeds easily, completed ceftin, the right foot redness is less, he is on terbinafine 7/13; left buttock wound about the same perhaps slightly narrower. Area on the left lateral leg continues to narrow. Left dorsal foot slightly smaller right foot about the same. We are using silver alginate on the right foot and Hydrofera Blue to the areas on the left. Unna boot on the left 2 layer compression on the right 7/20; left buttock wound absolutely the same. Area on lateral leg continues to get better. Left dorsal foot require debridement as did the right no  major change in the 7/27; left buttock wound the same size necrotic debris over the surface. The area on the lateral leg is closed once again. His left foot looks better right foot about the same although there is some involvement now of the posterior first second toe area. He is still on terbinafine which I have given him for a month, not certain a centimeter major change 06/25/19-All wounds appear to be slightly improved according to report, left buttock wound looks clean, both foot wounds have minimal to no debris the right dorsal foot has minimal slough. We are using Hydrofera Blue to the left and silver alginate to the right foot and ischial wound. 8/10-Wounds all appear to be around the same, the right forefoot distal part has some redness which was not there before, however the wound looks clean and small. Ischial wound looks about the same with no changes 8/17; his wound on the left lateral calf which was his original chronic venous insufficiency wound remains closed. Since I last saw him the areas on the left dorsal foot right dorsal foot generally appear better but require debridement. The area on his left initial tuberosity appears  somewhat larger to me perhaps hyper granulated and bleeds very easily. We have been using Hydrofera Blue to the left dorsal foot and silver alginate to everything else 8/24; left lateral calf remains closed. The areas on his dorsal feet on the webspace of the first and second toes bilaterally both look better. The area on the left buttock which is the pressure ulcer stage II slightly smaller. I change the dressing to Hydrofera Blue to all areas 8/31; left lateral calf remains closed. The area on his dorsal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 course of [place]'s for MRSA in the face of multiple antibiotic allergies. Arterial studies showed an ABI of him 0.88 on the right 1.17 on the left the. Waveforms were biphasic at the posterior tibial and dorsalis pedis digital waveforms were normal. Right toe brachial index was 1.3 limited by shaking and edema. His venous study showed widespread reflux in the left at the common femoral vein the greater and lesser saphenous vein the greater and lesser saphenous  vein on the right as well as the popliteal and femoral vein. The popliteal and femoral vein on the left did not show reflux. His wounds on the right leg give healed on the left he is still using Santyl. 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 The erythema has not really resolved and I wonder if this is mostly venous inflammation rather than cellulitis. Still using Santyl. He is approved for Apligraf 03/04/16; there is less erythema around the wound. Both wounds require aggressive surgical debridement. Not yet ready for Apligraf 03/11/16; aggressive debridement again. Not ready for Apligraf 03/18/16 aggressive debridement again. 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 Has been to see vascular surgery he is being planned for a venous ablation 03/25/16; aggressive debridement again of both wound areas on the left lateral leg. He is due for ablation surgery on May 22. He is much closer to being ready for an Apligraf. Has a new area between the left first and second toes 04/01/16 aggressive debridement done of both wounds. The new wound at the base of between his second and first toes looks stable 04/08/16; continued aggressive debridement of both wounds on the left lower leg. He goes for his venous ablation on Monday. The new wound at the base of his first and second toes dorsally appears stable. 04/15/16; wounds aggressively debridement although the base of this looks considerably better Apligraf #1. He had ablation surgery on Monday I'll need to research these records. We only have approval for four Apligraf's 04/22/16; the patient is here for a wound check [Apligraf last week] intake nurse concerned about erythema around the wounds. Apparently a significant degree of drainage. The patient has chronic venous inflammation which I think accounts for most of this however I was asked to look at this today 04/26/16; the patient came back for check of  possible cellulitis in his left foot however the Apligraf dressing was inadvertently removed therefore we elected to prep the wound for a second Apligraf. I put him on doxycycline on 6/1 the erythema in the foot 05/03/16 we did not remove the dressing from the superior wound as this is where I put all of his last Apligraf. Surface debridement done with a curette of the lower wound which looks very healthy. The area on the left foot also looks quite satisfactory at the dorsal artery at the first and second toes 05/10/16; continue Apligraf to this. Her wound, Hydrafera to the  lower wound. He has a new area on the right second toe. Left dorsal foot firstoosecond toe also looks improved 05/24/16; wound dimensions must be smaller I was able to use Apligraf to all 3 remaining wound areas. 06/07/16 patient's last Apligraf was 2 weeks ago. He arrives today with the 2 wounds on his lateral left leg joined together. This would have to be seen as a negative. He also has a small wound in his first and second toe on the left dorsally with quite a bit of surrounding erythema in the first second and third toes. This looks to be infected or inflamed, very difficult clinical call. 06/21/16: lateral left leg combined wounds. Adherent surface slough area on the left dorsal foot at roughly the fourth toe looks improved 07/12/16; he now has a single linear wound on the lateral left leg. This does not look to be a lot changed from when I lost saw this. The area on his dorsal left foot looks considerably better however. 08/02/16; no major change in the substantial area on his left lateral leg since last time. We have been using Hydrofera Blue for a prolonged period of time now. The area on his left foot is also unchanged from last review 07/19/16; the area on his dorsal foot on the left looks considerably smaller. He is beginning to have significant rims of epithelialization on the lateral left leg wound. This also looks  better. 08/05/16; the patient came in for a nurse visit today. Apparently the area on his left lateral leg looks better and it was wrapped. However in general discussion the patient noted a new area on the dorsal aspect of his right second toe. The exact etiology of this is unclear but likely relates to pressure. 08/09/16 really the area on the left lateral leg did not really look that healthy today perhaps slightly larger and measurements. The area on his dorsal right second toe is improved also the left foot wound looks stable to improved 08/16/16; the area on the last lateral leg did not change any of dimensions. Post debridement with a curet the area looked better. Left foot wound improved and the area on the dorsal right second toe is improved 08/23/16; the area on the left lateral leg may be slightly smaller both in terms of length and width. Aggressive debridement with a curette afterwards the tissue appears healthier. Left foot wound appears improved in the area on the dorsal right second toe is improved 08/30/16 patient developed a fever over the weekend and was seen in an urgent care. Felt to have a UTI and put on doxycycline. He has been since changed over the phone to [facility]. After we took off the wrap on his right leg today the leg is swollen warm and erythematous, probably more likely the source of the fever 09/06/16; have been using collagen to the major left leg wound, silver alginate to the area on his anterior foot/toes 09/13/16; the areas on his anterior foot/toes on both sides appear to be virtually closed. Extensive wound on the left lateral leg perhaps slightly narrower but each visit still covered an adherent surface slough 09/16/16 patient was in for his usual Thursday nurse visit however the intake nurse noted significant erythema of his dorsal right foot. He is also running a low- grade fever and having increasing spasms in the right leg 09/20/16 here for cellulitis involving  his right great toes and forefoot. This is a lot better. Still requiring debridement on his left lateral leg.  Santyl direct says he needs prior authorization. Therefore his wife cannot change this at home 09/30/16; the patient's extensive area on the left lateral calf and ankle perhaps somewhat better. Using Santyl. The area on the left toes is healed and I think the area on his right dorsal foot is healed as well. There is no cellulitis or venous inflammation involving the right leg. He is going to need compression stockings here. 10/07/16; the patient's extensive wound on the left lateral calf and ankle does not measure any differently however there appears to be less adherent surface slough using Santyl and aggressive weekly debridements 10/21/16; no major change in the area on the left lateral calf. Still the same measurement still very difficult to debridement adherent slough and nonviable subcutaneous tissue. This is not really been helped by several weeks of Santyl. Previously for 2 weeks I used Iodoflex for a short period. A prolonged course of Hydrofera Blue didn't really help. I'm not sure why I only used 2 weeks of Iodoflex on this there is no evidence of surrounding infection. He has a small area on the right second toe which looks as though it's progressing towards closure 10/28/16; the wounds on his toes appear to be closed. No major change in the left lateral leg wound although the surface looks somewhat better using Iodoflex. He has had previous arterial studies that were normal. He has had reflux studies and is status post ablation although I don't have any exact notes on which vein was ablated. I'll need to check the surgical record 11/04/16; he's had a reopening between the first and second toe on the left and right. No major change in the left lateral leg wound. There is what appears to be cellulitis of the left dorsal foot 11/18/16 the patient was hospitalized initially in [place]  and then subsequently transferred to [facility] long and was admitted there from 11/09/16 through 11/12/16. He had developed progressive cellulitis on the right leg in spite of the doxycycline I gave him. I'd spoken to the hospitalist in [place] who was concerned about continuing leukocytosis. CT scan is what I suggested this was done which showed soft tissue swelling without evidence of osteomyelitis or an underlying abscess blood cultures were negative. At [facility] he was treated with vancomycin and Primaxin and then add an infectious disease consult. He was transitioned to Ceftaroline. He has been making progressive improvement. Overall a severe cellulitis of the right leg. He is been using silver alginate to her original wound on the left leg. The wounds in his toes on the right are closed there is a small open area on the base of the left second toe 11/26/15; the patient's right leg is much better although there is still some edema here this could be reminiscent from his severe cellulitis likely on top of some degree of lymphedema. His left anterior leg wound has less surface slough as reported by her intake nurse. Small wound at the base of the left second toe 12/02/16; patient's right leg is better and there is no open wound here. His left anterior lateral leg wound continues to have a healthy-looking surface. Small wound at the base of the left second toe however there is erythema in the left forefoot which is worrisome 12/16/16; is no open wounds on his right leg. We took measurements for stockings. His left anterior lateral leg wound continues to have a healthy-looking surface. I'm not sure where we were with the Apligraf run through his insurance. We have  been using Iodoflex. He has a thick eschar on the left first second toe interface, I suspect this may be fungal however there is no visible open 12/23/16; no open wound on his right leg. He has 2 small areas left of the linear wound that was  remaining last week. We have been using Prisma, I thought I have disclosed this week, we can only look forward to next week 01/03/17; the patient had concerning areas of erythema last week, already on doxycycline for UTI through his primary doctor. The erythema is absolutely no better there is warmth and swelling both medially from the left lateral leg wound and also the dorsal left foot. 01/06/17- Patient is here for follow-up evaluation of his left lateral leg ulcer and bilateral feet ulcers. He is on oral antibiotic therapy, tolerating that. Nursing staff and the patient states that the erythema is improved from Monday. 01/13/17; the predominant left lateral leg wound continues to be problematic. I had put Apligraf on him earlier this month once. However he subsequently developed what appeared to be an intense cellulitis around the left lateral leg wound. I gave him Dalvance I think on 2/12 perhaps 2/13 he continues on cefdinir. The erythema is still present but the warmth and swelling is improved. I am hopeful that the cellulitis part of this control. I wouldn't be surprised if there is an element of venous inflammation as well. 01/17/17. The erythema is present but better in the left leg. His left lateral leg wound still does not have a viable surface buttons certain parts of this long thin wound it appears like there has been improvement in dimensions. 01/20/17; the erythema still present but much better in the left leg. I'm thinking this is his usual degree of chronic venous inflammation. The wound on the left leg looks somewhat better. Is less surface slough 01/27/17; erythema is back to the chronic venous inflammation. The wound on the left leg is somewhat better. I am back to the point where I like to try an Apligraf once again 02/10/17; slight improvement in wound dimensions. Apligraf #2. He is completing his doxycycline 02/14/17; patient arrives today having completed doxycycline last Thursday.  This was supposed to be a nurse visit however once again he hasn't tense erythema from the medial part of his wound extending over the lower leg. Also erythema in his foot this is roughly in the same distribution as last time. He has baseline chronic venous inflammation however this is a lot worse than the baseline I have learned to accept the on him is baseline inflammation 02/24/17- patient is here for follow-up evaluation. He is tolerating compression therapy. His voicing no complaints or concerns he is here anticipating an Apligraf 03/03/17; he arrives today with an adherent necrotic surface. I don't think this is surface is going to be amenable for Apligraf's. The erythema around his wound and on the left dorsal foot has resolved he is off antibiotics 03/10/17; better-looking surface today. I don't think he can tolerate Apligraf's. He tells me he had a wound VAC after a skin graft years ago to this area and they had difficulty with a seal. The erythema continues to be stable around this some degree of chronic venous inflammation but he also has recurrent cellulitis. We have been using Iodoflex 03/17/17; continued improvement in the surface and may be small changes in dimensions. Using Iodoflex which seems the only thing that will control his surface 03/24/17- He is here for follow up evaluation of his LLE  lateral ulceration and ulcer to right dorsal foot/toe space. He is voicing no complaints or concerns, He is tolerating compression wrap. 03/31/17 arrives today with a much healthier looking wound on the left lower extremity. We have been using Iodoflex for a prolonged period of time which has for the first time prepared and adequate looking wound bed although we have not had much in the way of wound dimension improvement. He also has a small wound between the first and second toe on the right 04/07/17; arrives today with a healthy-looking wound bed and at least the top 50% of this wound appears to be  now her. No debridement was required I have changed him to [facility] last week after prolonged Iodoflex. He did not do well with Apligraf's. We've had a re-opening between the first and second toe on the right 04/14/17; arrives today with a healthier looking wound bed contractions and the top 50% of this wound and some on the lesser 50%. Wound bed appears healthy. The area between the first and second toe on the right still remains problematic 04/21/17; continued very gradual improvement. Using [facility] 04/28/17; continued very gradual improvement in the left lateral leg venous insufficiency wound. His periwound erythema is very mild. We have been using Hydrofera Blue. Wound is making progress especially in the superior 50% 05/05/17; he continues to have very gradual improvement in the left lateral venous insufficiency wound. Both in terms with an length rings are improving. I debrided this every 2 weeks with #5 curet and we have been using Hydrofera Blue and again making good progress With regards to the wounds between his right first and second toe which I thought might of been tinea pedis he is not making as much progress very dry scaly skin over the area. Also the area at the base of the left first and second toe in a similar condition 05/12/17; continued gradual improvement in the refractory left lateral venous insufficiency wound on the left. Dimension smaller. Surface still requiring debridement using Hydrofera Blue 05/19/17; continued gradual improvement in the refractory left lateral venous ulceration. Careful inspection of the wound bed underlying rumination suggested some degree of epithelialization over the surface no debridement indicated. Continue Hydrofera Blue difficult areas between his toes first and third on the left than first and second on the right. I'm going to change to silver alginate from silver collagen. Continue ketoconazole as I suspect underlying tinea pedis 05/26/17;  left lateral leg venous insufficiency wound. We've been using Hydrofera Blue. I believe that there is expanding epithelialization over the surface of the wound albeit not coming from the wound circumference. This is a bit of an odd situation in which the epithelialization seems to be coming from the surface of the wound rather than in the exact circumference. There is still small open areas mostly along the lateral margin of the wound. ooHe has unchanged areas between the left first and second and the right first second toes which I been treating for tenia pedis 06/02/17; left lateral leg venous insufficiency wound. We have been using Hydrofera Blue. Somewhat smaller from the wound circumference. The surface of the wound remains a bit on it almost epithelialized sedation in appearance. I use an open curette today debridement in the surface of all of this especially the edges ooSmall open wounds remaining on the dorsal right first and second toe interspace and the plantar left first second toe and her face on the left 06/09/17; wound on the left lateral leg continues  to be smaller but very gradual and very dry surface using Hydrofera Blue 06/16/17 requires weekly debridements now on the left lateral leg although this continues to contract. I changed to silver collagen last week because of dryness of the wound bed. Using Iodoflex to the areas on his first and second toes/web space bilaterally 06/24/17; patient with history of paraplegia also chronic venous insufficiency with lymphedema. Has a very difficult wound on the left lateral leg. This has been gradually reducing in terms of with but comes in with a very dry adherent surface. High switch to silver collagen a week or so ago with hydrogel to keep the area moist. This is been refractory to multiple dressing attempts. He also has areas in his first and second toes bilaterally in the anterior and posterior web space. I had been using Iodoflex here after a  prolonged course of silver alginate with ketoconazole was ineffective [question tinea pedis] 07/14/17; patient arrives today with a very difficult adherent material over his left lateral lower leg wound. He also has surrounding erythema and poorly controlled edema. He was switched his Santyl last visit which the nurses are applying once during his doctor visit and once on a nurse visit. He was also reduced to 2 layer compression I'm not exactly sure of the issue here. 07/21/17; better surface today after 1 week of Iodoflex. Significant cellulitis that we treated last week also better. [Doxycycline] 07/28/17 better surface today with now 2 weeks of Iodoflex. Significant cellulitis treated with doxycycline. He has now completed the doxycycline and he is back to his usual degree of chronic venous inflammation/stasis dermatitis. He reminds me he has had ablations surgery here 08/04/17; continued improvement with Iodoflex to the left lateral leg wound in terms of the surface of the wound although the dimensions are better. 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 Problematic areas on the plantar aspect of the first second toe web space on the left and the dorsal aspect of the first second toe web space on the right. At one point I felt these were probably related to chronic fungal infections in treated him aggressively for this although we have not made any improvement here. 08/11/17; left lateral leg. Surface continues to improve with the Iodoflex although we are not seeing much improvement in overall wound dimensions. Areas on his plantar left foot and right foot show no improvement. In fact the right foot looks somewhat worse 08/18/17; left lateral leg. We changed to [facility] Blue last week after a prolonged course of Iodoflex which helps get the surface better. It appears that the wound with is improved. Continue with difficult areas on  the left dorsal first second and plantar first second on the right 09/01/17; patient arrives in clinic today having had a temperature of 103 yesterday. He was seen in the ER and [facility]. The patient was concerned he could have cellulitis again in the right leg however they diagnosed him with a UTI and he is now on Keflex. He has a history of cellulitis which is been recurrent and difficult but this is been in the left leg, in the past 5 use doxycycline. He does in and out catheterizations at home which are risk factors for UTI 09/08/17; patient will be completing his Keflex this weekend. The erythema on the left leg is considerably better. He has a new wound today on the medial part of the right leg small superficial almost looks like a  skin tear. He has worsening of the area on the right dorsal first and second toe. His major area on the left lateral leg is better. Using Hydrofera Blue on all areas 09/15/17; gradual reduction in width on the long wound in the left lateral leg. No debridement required. He also has wounds on the plantar aspect of his left first second toe web space and on the dorsal aspect of the right first second toe web space. 09/22/17; there continues to be very gradual improvements in the dimensions of the left lateral leg wound. He hasn't round erythematous spot with might be pressure on his wheelchair. There is no evidence obviously of infection no purulence no warmth ooHe has a dry scaled area on the plantar aspect of the left first second toe ooImproved area on the dorsal right first second toe. 09/29/17; left lateral leg wound continues to improve in dimensions mostly with an is still a fairly long but increasingly narrow wound. ooHe has a dry scaled area on the plantar aspect of his left first second toe web space ooIncreasingly concerning area on the dorsal right first second toe. In fact I am concerned today about possible cellulitis around this wound. The areas  extending up his second toe and although there is deformities here almost appears to abut on the nailbed. 10/06/17; left lateral leg wound continues to make very gradual progress. Tissue culture I did from the right first second toe dorsal foot last time grew MRSA and enterococcus which was vancomycin sensitive. This was not sensitive to clindamycin or doxycycline. He is allergic to Zyvox and sulfa we have therefore arrange for him to have dalvance infusion tomorrow. He is had this in the past and tolerated it well 10/20/17; left lateral leg wound continues to make decent progress. This is certainly reduced in terms of with there is advancing epithelialization.ooThe cellulitis in the right foot looks better although he still has a deep wound in the dorsal aspect of the first second toe web space. Plantar left first toe web space on the left I think is making some progress 10/27/17; left lateral leg wound continues to make decent progress. Advancing epithelialization.using Hydrofera Blue ooThe right first second toe web space wound is better-looking using silver alginate ooImprovement in the left plantar first second toe web space. Again using silver alginate 11/03/17 left lateral leg wound continues to make decent progress albeit slowly. Using Hydrofera Blue ooThe right per second toe web space continues to be a very problematic looking punched out wound. I obtained a piece of tissue for deep culture I did extensively treated this for fungus. It is difficult to imagine that this is a pressure area as the patient states other than going outside he doesn't really wear shoes at home ooThe left plantar first second toe web space looked fairly senescent. Necrotic edges. This required debridement oochange to Hydrofera Blue to all wound areas 11/10/17; left lateral leg wound continues to contract. Using Hydrofera Blue ooOn the right dorsal first second toe web space dorsally. Culture I did of this  area last week grew MRSA there is not an easy oral option in this patient was multiple antibiotic allergies or intolerances. This was only a rare culture isolate I'm therefore going to use Bactroban under silver alginate ooOn the left plantar first second toe web space. Debridement is required here. This is also unchanged 11/17/17; left lateral leg wound continues to contract using Hydrofera Blue this is no longer the major issue. ooThe major concern here  is the right first second toe web space. He now has an open area going from dorsally to the plantar aspect. There is now wound on the inner lateral part of the first toe. Not a very viable surface on this. There is erythema spreading medially into the forefoot. ooNo major change in the left first second toe plantar wound 11/24/17; left lateral leg wound continues to contract using Hydrofera Blue. Nice improvement today ooThe right first second toe web space all of this looks a lot less angry than last week. I have given him clindamycin and topical Bactroban for MRSA and terbinafine for the possibility of underlining tinea pedis that I could not control with ketoconazole. Looks somewhat better ooThe area on the plantar left first second toe web space is weeping with dried debris around the wound 12/01/17; left lateral leg wound continues to contract he Hydrofera Blue. It is becoming thinner in terms of with nevertheless it is making good improvement. ooThe right first second toe web space looks less angry but still a large necrotic-looking wounds starting on the plantar aspect of the right foot extending between the toes and now extensively on the base of the right second toe. I gave him clindamycin and topical Bactroban for MRSA anterior benefiting for the possibility of underlying tinea pedis. Not looking better today ooThe area on the left first/second toe looks better. Debrided of necrotic debris 12/05/17* the patient was worked in urgently  today because over the weekend he found blood on his incontinence bad when he woke up. He was found to have an ulcer by his wife who does most of his wound care. He came in today for [place] to look at this. He has not had a history of wounds in his buttocks in spite of his paraplegia. 12/08/17; seen in follow-up today at his usual appointment. He was seen earlier this week and found to have a new wound on his buttock. We also follow him for wounds on the left lateral leg, left first second toe web space and right first second toe web space 12/15/17; we have been using Hydrofera Blue to the left lateral leg which has improved. The right first second toe web space has also improved. Left first second toe web space plantar aspect looks stable. The left buttock has worsened using Santyl. Apparently the buttock has drainage 12/22/17; we have been using Hydrofera Blue to the left lateral leg which continues to improve now 2 small wounds separated by normal skin. He tells [place] he had a fever up to 100 yesterday he is prone to UTIs but has not noted anything different. He does in and out catheterizations. The area between the first and second toes today does not look good necrotic surface covered with what looks to be purulent drainage and erythema extending into the third toe. I had gotten this to something that I thought look better last time however it is not look good today. He also has a necrotic surface over the buttock wound which is expanded. I thought there might be infection under here so I removed a lot of the surface with a #5 curet though nothing look like it really needed culturing. He is been using Santyl to this area 12/27/17; his original wound on the left lateral leg continues to improve using Hydrofera Blue. I gave him samples of Baxdella although he was unable to take them out of fear for an allergic reaction ["lump in his throat"].the culture I did of the purulent drainage  from his second toe last  week showed both enterococcus and a set Enterobacter I was also concerned about the erythema on the bottom of his foot although paradoxically although this looks somewhat better today. 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 We've been using silver alginate here. They came up today that he sleeps in a recliner, I'm not sure why but I asked him to stop this 01/03/18; his original wound we've been using Hydrofera Blue is now separated into 2 areas. ooUlcer on his left buttock is better he is off the recliner and sleeping in bed ooFinally both wound areas between his first and second toes also looks some better 01/10/18; his original wound on the left lateral leg is now separated into 2 wounds we've been using Hydrofera Blue ooUlcer on his left buttock has some drainage. There is a small probing site going into muscle layer superiorly.using silver alginate -He arrives today with a deep tissue injury on the left heel ooThe wound on the dorsal aspect of his first second toe on the left looks a lot betterusing silver alginate ketoconazole ooThe area on the first second toe web space on the right also looks a lot bette 01/17/18; his original wound on the left lateral leg continues to progress using Hydrofera Blue ooUlcer on his left buttock also is smaller surface healthier except for a small probing site going into the muscle layer superiorly. 2.4 cm of tunneling in this area ooDTI on his left heel we have only been offloading. Looks better than last week no threatened open no evidence of infection oothe wound on the dorsal aspect of the first second toe on the left continues to look like it's regressing we have only been using silver alginate and terbinafine orally ooThe area in the first second toe web space on the right also looks to be a lot better using silver alginate and terbinafine I think this  was prompted by tinea pedis 01/31/18; the patient was hospitalized in [place] last week apparently for a complicated UTI. He was discharged on cefepime he does in and out catheterizations. In the hospital he was discovered M I don't mild elevation of AST and ALT and the terbinafine was stopped.predictably the pressure ulcer on s his buttock looks betterusing silver alginate. The area on the left lateral leg also is better using Hydrofera Blue. The area between the first and second toes on the left better. First and second toes on the right still substantial but better. Finally the DTI on the left heel has held together and looks like it's resolving 02/07/18-he is here in follow-up evaluation for multiple ulcerations. He has new injury to the lateral aspect of the last issue a pressure ulcer, he states this is from adhesive removal trauma. He states he has tried multiple adhesive products with no success. All other ulcers appear stable. The left heel DTI is resolving. We will continue with same treatment plan and follow-up next week. 02/14/18; follow-up for multiple areas. ooHe has a new area last week on the lateral aspect of his pressure ulcer more over the posterior trochanter. The original pressure ulcer looks quite stable has healthy granulation. 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 Almost fully epithelialized on the original superior area using Hydrofera Blue ooDTI on the left heel has peeled off this week  to reveal a small superficial wound under denuded skin and subcutaneous tissue ooBoth areas between the first and second toes look better including nothing open on the left 02/21/18; ooThe patient's wounds on his left ischial tuberosity and posterior left greater trochanter actually looked better. He has a large area of irritation around the area which I think is contact  dermatitis. I am doubtful that this is fungal ooHis original wound on the left lateral calf continues to improve we have been using Hydrofera Blue ooThere is no open area in the left first second toe web space although there is a lot of thick callus ooThe DTI on the left heel required debridement today of necrotic surface eschar and subcutaneous tissue using silver alginate ooFinally the area on the right first second toe webspace continues to contract using silver alginate and ketoconazole 02/28/18 ooLeft ischial tuberosity wounds look better using silver alginate. ooOriginal wound on the left calf only has one small open area left using Hydrofera Blue ooDTI on the left heel required debridement mostly removing skin from around this wound surface. Using silver alginate ooThe areas on the right first/second toe web space using silver alginate and ketoconazole 03/08/18 on evaluation today patient appears to be doing decently well as best I can tell in regard to his wounds. This is the first time that I have seen him as he generally is followed by Dr. Dellia Nims. With that being said none of his wounds appear to be infected he does have an area where there is some skin covering what appears to be a new wound on the left dorsal surface of his great toe. This is right at the nail bed. With that being said I do believe that debrided away some of the excess skin can be of benefit in this regard. Otherwise he has been tolerating the dressing changes without complication. 03/14/18; patient arrives today with the multiplicity of wounds that we are following. He has not been systemically unwell ooOriginal wound on the left lateral calf now only has 2 small open areas we've been using Hydrofera Blue which should continue ooThe deep tissue injury on the left heel requires debridement today. We've been using silver alginate ooThe left first second toe and the right first second toe are both are reminiscence  what I think was tinea pedis. Apparently some of the callus Surface between the toes was removed last week when it started draining. ooPurulent drainage coming from the wound on the ischial tuberosity on the left. 03/21/18-He is here in follow-up evaluation for multiple wounds. 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 He tolerated debridement. The only change to last week's recommendations is to discontinue antifungal cream between toes. He will follow-up next week 03/28/18; following up for multiple wounds;Concern this week is streaking redness and swelling in the right foot. He is going to need antibiotics for this. 03/31/18; follow-up for right foot cellulitis. Streaking redness and swelling in the right foot on 03/28/18. 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 I put him on clindamycin 300 mg every 6 and brought him in for a quick check. He has an open wound between his first and second toes on the right foot as a potential source. 04/04/18; ooRight foot cellulitis is resolving he is completing clindamycin. This is truly good news ooLeft lateral calf wound which is initial wound only has one small open area inferiorly this is close to healing out. He has  compression stockings. We will use Hydrofera Blue right down to the epithelialization of this ooNonviable surface on the left heel which was initially pressure with a DTI. We've been using Hydrofera Blue. I'm going to switch this back to silver alginate ooLeft first second toe/tinea pedis this looks better using silver alginate ooRight first second toe tinea pedis using silver alginate ooLarge pressure ulcers on theLeft ischial tuberosity. Small wound here Looks better. I am uncertain about the surface over the large wound. Using silver alginate 04/11/18; ooCellulitis in the right foot is resolved ooLeft lateral calf wound which was his  original wounds still has 2 tiny open areas remaining this is just about closed ooNonviable surface on the left heel is better but still requires debridement ooLeft first second toe/tinea pedis still open using silver alginate ooRight first second toe wound tinea pedis I asked him to go back to using ketoconazole and silver alginate ooLarge pressure ulcers on the left ischial tuberosity this shear injury here is resolved. Wound is smaller. No evidence of infection using silver alginate 04/18/18; ooPatient arrives with an intense area of cellulitis in the right mid lower calf extending into the right heel area. Bright red and warm. Smaller area on the left anterior leg. He has a significant history of MRSA. He will definitely need antibioticsoodoxycycline ooHe now has 2 open areas on the left ischial tuberosity the original large wound and now a satellite area which I think was above his initial satellite areas. Not a wonderful surface on this satellite area surrounding erythema which looks like pressure related. ooHis left lateral calf wound again his original wound is just about closed ooLeft heel pressure injury still requiring debridement ooLeft first second toe looks a lot better using silver alginate ooRight first second toe also using silver alginate and ketoconazole cream also looks better 04/20/18; the patient was worked in early today out of concerns with his cellulitis on the right leg. I had started him on doxycycline. This was 2 days ago. His wife was concerned about the swelling in the area. Also concerned about the left buttock. He has not been systemically unwell no fever chills. No nausea vomiting or diarrhea 04/25/18; the patient's left buttock wound is continued to deteriorate he is using Hydrofera Blue. He is still completing clindamycin for the cellulitis on the right leg although all of this looks better. 05/02/18 ooLeft buttock wound still with a lot of drainage and a  very tightly adherent fibrinous necrotic surface. He has a deeper area superiorly ooThe left lateral calf wound is still closed ooDTI wound on the left heel necrotic surface especially the circumference using Iodoflex ooAreas between his left first second toe and right first second toe both look better. Dorsally and the right first second toe he had a necrotic surface although at smaller. In using silver alginate and ketoconazole. I did a culture last week which was a deep tissue culture of the reminiscence of the open wound on the right first second toe dorsally. This grew a few Acinetobacter and a few methicillin-resistant staph aureus. Nevertheless the area actually this week looked better. I didn't feel the need to specifically address this at least in terms of systemic antibiotics. 05/09/18; wounds are measuring larger more drainage per our intake. We are using Santyl covered with alginate on the large superficial buttock wounds, Iodosorb on the left heel, ketoconazole and silver alginate to the dorsal first and second toes bilaterally. 05/16/18; ooThe area on his left buttock better in some aspects  although the area superiorly over the ischial tuberosity required an extensive debridement.using Santyl ooLeft heel appears stable. Using Iodoflex ooThe areas between his first and second toes are not bad however there is spreading erythema up the dorsal aspect of his left foot this looks like cellulitis again. He is insensate the erythema is really very brilliant.o Erysipelas He went to see an allergist days ago because he was itching part of this he had lab work done. This showed a white count of 15.1 with 70% neutrophils. Hemoglobin of 11.4 and a platelet count of 659,000. Last white count we had in Epic was a 2-1/2 years ago which was 25.9 but he was ill at the time. He was able to show me some lab work that was done by his primary physician the pattern is about the same. 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 But prompted me to go ahead and do x-rays of both feet and the pelvis rule out osteomyelitis. He also had a comprehensive metabolic panel this was reasonably normal his albumin was 3.7 liver function tests BUN/creatinine all normal 05/23/18; x-rays of both his feet from last week were negative for underlying pulmonary abnormality. The x-ray of his pelvis however showed mild irregularity in the left ischial which may represent some early osteomyelitis. The wound in the left ischial continues to get deeper clearly now exposed muscle. Each week necrotic surface material over this area. Whereas the rest of the wounds do not look so bad. ooThe left ischial wound we have been using Santyl and calcium alginate ooT the left heel surface necrotic debris using Iodoflex o ooThe left lateral leg is still healed ooAreas on the left dorsal foot and the right dorsal foot are about the same. There is some inflammation on the left which might represent contact dermatitis, fungal dermatitis I am doubtful cellulitis although this looks better than last week 05/30/18; CT scan done at Hospital did not show any osteomyelitis or abscess. Suggested the possibility of underlying cellulitis although I don't see a lot of evidence of this at the bedside ooThe wound itself on the left buttock/upper thigh actually looks somewhat better. No debridement ooLeft heel also looks better no debridement continue Iodoflex ooBoth dorsal first second toe spaces appear better using Lotrisone. Left still required debridement 06/06/18; ooIntake reported some purulent looking drainage from the left gluteal wound. Using Santyl and calcium alginate ooLeft heel looks better although still a nonviable surface requiring debridement ooThe left dorsal foot first/second webspace actually expanding and somewhat deeper. I may consider doing a shave biopsy of this  area ooRight dorsal foot first/second webspace appears stable to improved. Using Lotrisone and silver alginate to both these areas 06/13/18 ooLeft gluteal surface looks better. Now separated in the 2 wounds. No debridement required. Still drainage. We'll continue silver alginate ooLeft heel continues to look better with Iodoflex continue this for at least another week ooOf his dorsal foot wounds the area on the left still has some depth although it looks better than last week. We've been using Lotrisone and silver alginate 06/20/18 ooLeft gluteal continues to look better healthy tissue ooLeft heel continues to look better healthy granulation wound is smaller. He is using Iodoflex and his long as this continues continue the Iodoflex ooDorsal right foot looks better unfortunately dorsal left foot does not. There is swelling and erythema of his forefoot. He had minor trauma to this several days ago but doesn't think this was  enough to have caused any tissue injury. Foot looks like cellulitis, we have had this problem before 06/27/18 on evaluation today patient appears to be doing a little worse in regard to his foot ulcer. 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 Both of which would really be his only options for treating this infection. In the past he has been given and effusion of Orbactiv. This is done very well for him in the past again it's one time dosing IV antibiotic therapy. Subsequently I do believe this is something we're gonna need to see about doing at this point in time. Currently his other wounds seem to be doing somewhat better in my pinion I'm pretty happy in that regard. 07/03/18 on evaluation today patient's wounds actually appear to be doing fairly well. He has been tolerating the dressing changes without complication. All in all he seems  to be showing signs of improvement. In regard to the antibiotics he has been dealing with infectious disease since I saw him last week as far as getting this scheduled. In the end [facility]'s [facility] this coming Friday. In the meantime he has been continuing to perform the dressing changes in such as previous. There does not appear to be any evidence of infection worsengin at this time. 07/10/18; ooSince I last saw this man 2 weeks ago things have actually improved. IV antibiotics of resulted in less forefoot erythema although there is still some present. He is not systemically unwell ooLeft buttock wounds o2 now have no depth there is increased epithelialization Using silver alginate ooLeft heel still requires debridement using Iodoflex ooLeft dorsal foot still with a sizable wound about the size of a border but healthy granulation ooRight dorsal foot still with a slitlike area using silver alginate 07/18/18; the patient's cellulitis in the left foot is improved in fact I think it is on its way to resolving. ooLeft buttock wounds o2 both look better although the larger one has hypertension granulation we've been using silver alginate ooLeft heel has some thick circumferential redundant skin over the wound edge which will need to be removed today we've been using Iodoflex ooLeft dorsal foot is still a sizable wound required debridement using silver alginate ooThe right dorsal foot is just about closed only a small open area remains here 07/25/18; left foot cellulitis is resolved ooLeft buttock wounds o2 both look better. Hyper-granulation on the major area ooLeft heel as some debris over the surface but otherwise looks a healthier wound. Using silver collagen ooRight dorsal foot is just about closed 07/31/18; arrives with our intake nurse worried about purulent drainage from the buttock. We had hyper-granulation here last week ooHis  buttock wounds o2 continue to look better ooLeft heel some debris over the surface but measuring smaller. ooRight dorsal foot unfortunately has openings between the toes ooLeft foot superficial wound looks less aggravated. 08/07/18 ooButtock wounds continue to look better although some of her granulation and the larger medial wound. silver alginate ooLeft heel continues to look a lot better.silver collagen ooLeft foot superficial wound looks less stable. Requires debridement. He has a new wound superficial area on the foot on the lateral dorsal foot. ooRight foot looks better using silver alginate without Lotrisone 08/14/2018; patient was in the ER last week diagnosed with a UTI. He is  now on Cefpodoxime and Macrodantin. ooButtock wounds continued to be smaller. Using silver alginate ooLeft heel continues to look better using silver collagen ooLeft foot superficial wound looks as though it is improving ooRight dorsal foot area is just about healed. 08/21/2018; patient is completed his antibiotics for his UTI. ooHe has 2 open areas on the buttocks. There is still not closed although the surface looks satisfactory. Using silver alginate ooLeft heel continues to improve using silver collagen ooThe bilateral dorsal foot areas which are at the base of his first and second toes/possible tinea pedis are actually stable on the left but worse on the right. The area on the left required debridement of necrotic surface. After debridement I obtained a specimen for PCR culture. ooThe right dorsal foot which is been just about healed last week is now reopened 08/28/2018; culture done on the left dorsal foot showed coag negative staph both staph epidermidis and Lugdunensis. I think this is worthwhile initiating systemic treatment. I will use doxycycline given his long list of allergies. The area on the left heel slightly improved but still requiring debridement. ooThe large wound on the buttock  is just about closed whereas the smaller one is larger. Using silver alginate in this area 09/04/2018; patient is completing his doxycycline for the left foot although this continues to be a very difficult wound area with very adherent necrotic debris. We are using silver alginate to all his wounds right foot left foot and the small wounds on his buttock, silver collagen on the left heel. 09/11/2018; once again this patient has intense erythema and swelling of the left forefoot. Lesser degrees of erythema in the right foot. He has a long list of allergies and intolerances. I will reinstitute doxycycline. 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 Using silver alginate ooLeft heel also looks better using silver collagen ooUnfortunately both the areas on his feet look worse. The area on the left first second webspace is now gone through to the plantar part of his foot. The area on the left foot anteriorly is irritated with erythema and swelling in the forefoot. 09/25/2018 ooHis wound on the left plantar heel looks better. Using silver collagen ooThe area on the left buttock 2 small remnant areas. One is closed one is still open. Using silver alginate ooThe areas between both his first and second toes look worse. This in spite of long-standing antifungal therapy with ketoconazole and silver alginate which should have antifungal activity ooHe has small areas around his original wound on the left calf one is on the bottom of the original scar tissue and one superiorly both of these are small and superficial but again given wound history in this site this is worrisome 10/02/2018 ooLeft plantar heel continues to gradually contract using silver collagen ooLeft buttock wound is unchanged using silver alginate ooThe areas on his dorsal feet between his first and second toes bilaterally look about the same. I prescribed clindamycin ointment to see if  we can address chronic staph colonization and also the underlying possibility of erythrasma ooThe left lateral lower extremity wound is actually on the lateral part of his ankle. Small open area here. We have been using silver alginate 10/09/2018; ooLeft plantar heel continues to look healthy and contract. No debridement is required ooLeft buttock slightly smaller with a tape injury wound just below which was new this week ooDorsal feet somewhat improved I have been using clindamycin ooLeft lateral looks lower  extremity the actual open area looks worse although a lot of this is epithelialized. I am going to change to silver collagen today He has a lot more swelling in the right leg although this is not pitting not red and not particularly warm there is a lot of spasm in the right leg usually indicative of people with paralysis of some underlying discomfort. We have reviewed his vascular status from 2017 he had a left greater saphenous vein ablation. I wonder about referring him back to vascular surgery if the area on the left leg continues to deteriorate. 10/16/2018 in today for follow-up and management of multiple lower extremity ulcers. His left Buttock wound is much lower smaller and almost closed completely. The wound to the left ankle has began to reopen with Epithelialization and some adherent slough. He has multiple new areas to the left foot and leg. The left dorsal foot without much improvement. Wound present between left great webspace and 2nd toe. Erythema and edema present right leg. Right LE ultrasound obtained on 10/10/18 was negative for DVT . 10/23/2018; ooLeft buttock is closed over. Still dry macerated skin but there is no open wound. I suspect this is chronic pressure/moisture ooLeft lateral calf is quite a bit worse than when I saw this last. There is clearly drainage here he has macerated skin into the left plantar heel. We will change the primary dressing to  alginate ooLeft dorsal foot has some improvement in overall wound area. Still using clindamycin and silver alginate ooRight dorsal foot about the same as the left using clindamycin and silver alginate ooThe erythema in the right leg has resolved. He is DVT rule out was negative ooLeft heel pressure area required debridement although the wound is smaller and the surface is health 10/26/2018 ooThe patient came back in for his nurse check today predominantly because of the drainage coming out of the left lateral leg with a recent reopening of his original wound on the left lateral calf. He comes in today with a large amount of surrounding erythema around the wound extending from the calf into the ankle and even in the area on the dorsal foot. He is not systemically unwell. He is not febrile. Nevertheless this looks like cellulitis. We have been using silver alginate to the area. I changed him to a regular visit and I am going to prescribe him doxycycline. The rationale here is a long list of medication intolerances and a history of MRSA. I did not see anything that I thought would provide a valuable culture 10/30/2018 ooFollow-up from his appointment 4 days ago with really an extensive area of cellulitis in the left calf left lateral ankle and left dorsal foot. I put him on doxycycline. He has a long list of medication allergies which are true allergy reactions. 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 In any case he is a lot better today. The erythema especially in the anterior and lateral left calf is better. He still has left ankle erythema. He also is complaining about increasing edema in the right leg we have only been using Kerlix Coban and he has been doing the wraps at home. Finally he has a spotty rash on the medial part of his upper left calf which looks like folliculitis or perhaps wrap occlusion type injury. Small  superficial macules not pustules 11/06/18 patient arrives today with again a considerable degree of erythema around the wound on the left lateral  calf extending into the dorsal ankle and dorsal foot. This is a lot worse than when I saw this last week. He is on doxycycline really with not a lot of improvement. He has not been systemically unwell Wounds on the; left heel actually looks improved. Original area on the left foot and proximity to the first and second toes looks about the same. He has superficial areas on the dorsal foot, anterior calf and then the reopening of his original wound on the left lateral calf which looks about the same ooThe only area he has on the right is the dorsal webspace first and second which is smaller. ooHe has a large area of dry erythematous skin on the left buttock small open area here. 11/13/2018; the patient arrives in much better condition. The erythema around the wound on the left lateral calf is a lot better. Not sure whether this was the clindamycin or the TCA and ketoconazole or just in the improvement in edema control [stasis dermatitis]. In any case this is a lot better. The area on the left heel is very small and just about resolved using silver collagen we have been using silver alginate to the areas on his dorsal feet 11/20/2018; his wounds include the left lateral calf, left heel, dorsal aspects of both feet just proximal to the first second webspace. He is stable to slightly improved. I did not think any changes to his dressings were going to be necessary 11/27/2018 he has a reopening on the left buttock which is surrounded by what looks like tinea or perhaps some other form of dermatitis. The area on the left dorsal foot has some erythema around it I have marked this area but I am not sure whether this is cellulitis or not. Left heel is not closed. Left calf the reopening is really slightly longer and probably worse 1/13; in general things look better  and smaller except for the left dorsal foot. Area on the left heel is just about closed, left buttock looks better only a small wound remains in the skin looks better [using Lotrisone] 1/20; the area on the left heel only has a few remaining open areas here. Left lateral calf about the same in terms of size, left dorsal foot slightly larger right lateral foot still not closed. The area on the left buttock has no open wound and the surrounding skin looks a lot better 1/27; the area on the left heel is closed. Left lateral calf better but still requiring extensive debridements. The area on his left buttock is closed. He still has the open areas on the left dorsal foot which is slightly smaller in the right foot which is slightly expanded. We have been using Iodoflex on these areas as well 2/3; left heel is closed. Left lateral calf still requiring debridement using Iodoflex there is no open area on his left buttock however he has dry scaly skin over a large area of this. Not really responding well to the Lotrisone. Finally the areas on his dorsal feet at the level of the first second webspace are slightly smaller on the right and about the same on the left. Both of these vigorously debrided with Anasept and gauze 2/10; left heel remains closed he has dry erythematous skin over the left buttock but there is no open wound here. Left lateral leg has come in and with. Still requiring debridement we have been using Iodoflex here. Finally the area on the left dorsal foot and right dorsal foot are  really about the same extremely dry callused fissured areas. He does not yet have a dermatology appointment 2/17; left heel remains closed. He has a new open area on the left buttock. The area on the left lateral calf is bigger longer and still covered in necrotic debris. No major change in his foot areas bilaterally. I am awaiting for a dermatologist to look on this. We have been using ketoconazole I do not know that  this is been doing any good at all. 2/24; left heel remains closed. The left buttock wound that was new reopening last week looks better. The left lateral calf appears better also although still requires debridement. The major area on his foot is the left first second also requiring debridement. We have been putting Prisma on all wounds. I do not believe that the ketoconazole has done too much good for his feet. He will use Lotrisone I am going to give him a 2-week course of terbinafine. We still do not have a dermatology appointment 3/2 left heel remains closed however there is skin over bone in this area I pointed this out to him today. The left buttock wound is epithelialized but still does not look completely stable. The area on the left leg required debridement were using silver collagen here. With regards to his feet we changed to Lotrisone last week and silver alginate. 3/9; left heel remains closed. Left buttock remains closed. The area on the right foot is essentially closed. The left foot remains unchanged. Slightly smaller on the left lateral calf. Using silver collagen to both of these areas 3/16-Left heel remains closed. Area on right foot is closed. Left lateral calf above the lateral malleolus open wound requiring debridement with easy bleeding. Left dorsal wound proximal to first toe also debrided. Left ischial area open new. Patient has been using Prisma with wrapping every 3 days. Dermatology appointment is apparently tomorrow.Patient has completed his terbinafine 2-week course with some apparent improvement according to him, there is still flaking and dry skin in his foot on the left 3/23; area on the right foot is reopened. The area on the left anterior foot is about the same still a very necrotic adherent surface. He still has the area on the left leg and reopening is on the left buttock. He apparently saw dermatology although I do not have a note. According to the patient who  is usually fairly well informed they did not have any good ideas. Put him on oral terbinafine which she is been on before. 3/30; using silver collagen to all wounds. Apparently his dermatologist put him on doxycycline and rifampin presumably some culture grew staph. I do not have this result. He remains on terbinafine although I have used terbinafine on him before 4/6; patient has had a fairly substantial reopening on the right foot between the first and second toes. He is finished his terbinafine and I believe is on doxycycline and rifampin still as prescribed by dermatology. We have been using silver collagen to all his wounds although the patient reports that he thinks silver alginate does better on the wounds on his buttock. 4/13; the area on his left lateral calf about the same size but it did not require debridement. ooLeft dorsal foot just proximal to the webspace between the first and second toes is about the same. Still nonviable surface. I note some superficial bronze discoloration of the dorsal part of his foot ooRight dorsal foot just proximal to the first and second toes also looks about  the same. I still think there may be the same discoloration I noted above on the left ooLeft buttock wound looks about the same 4/20; left lateral calf appears to be gradually contracting using silver collagen. 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 The left dorsal foot wound is debrided of tightly adherent necrotic debris and really cleans up quite nicely. The right area is worse with expansion. I did not debride this it is now over the base of the second toe ooThe area on his left buttock is smaller no debridement is required using silver collagen 5/4; left calf continues to make good progress. ooHe arrives with erythema around the wounds on his dorsal foot which even extends to the plantar aspect. Very concerning for coexistent  infection. He is finished the erythromycin I gave him for possible erythrasma this does not seem to have helped. ooThe area on the left foot is about the same base of the dorsal toes ooIs area on the buttock looks improved on the left 5/11; left calf and left buttock continued to make good progress. Left foot is about the same to slightly improved. ooMajor problem is on the right foot. He has not had an x-ray. Deep tissue culture I did last week showed both Enterobacter and E. coli. I did not change the doxycycline I put him on empirically although neither 1 of these were plated to doxycycline. He arrives today with the erythema looking worse on both the dorsal and plantar foot. Macerated skin on the bottom of the foot. he has not been systemically unwell 5/18-Patient returns at 1 week, left calf wound appears to be making some progress, left buttock wound appears slightly worse than last time, left foot wound looks slightly better, right foot redness is marginally better. X-ray of both feet show no air or evidence of osteomyelitis. Patient is finished his Omnicef and terbinafine. He continues to have macerated skin on the bottom of the left foot as well as right 5/26; left calf wound is better, left buttock wound appears to have multiple small superficial open areas with surrounding macerated skin. X-rays that I did last time showed no evidence of osteomyelitis in either foot. He is finished cefdinir and doxycycline. I do not think that he was on terbinafine. He continues to have a large superficial open area on the right foot anterior dorsal and slightly between the first and second toes. I did send him to dermatology 2 months ago or so wondering about whether they would do a fungal scraping. I do not believe they did but did do a culture. We have been using silver alginate to the toe areas, he has been using antifungals at home topically either ketoconazole or Lotrisone. We are using silver  collagen on the left foot, silver alginate on the right, silver collagen on the left lateral leg and silver alginate on the left buttock 6/1; left buttock area is healed. We have the left dorsal foot, left lateral leg and right dorsal foot. We are using silver alginate to the areas on both feet and silver collagen to the area on his left lateral calf 6/8; the left buttock apparently reopened late last week. He is not really sure how this happened. He is tolerating the terbinafine. Using silver alginate to all wounds 6/15; left buttock wound is larger than last week but still superficial. ooCame in the clinic today with a report of purulence from the left lateral leg I did not identify any  infection ooBoth areas on his dorsal feet appear to be better. He is tolerating the terbinafine. Using silver alginate to all wounds 6/22; left buttock is about the same this week, left calf quite a bit better. His left foot is about the same however he comes in with erythema and warmth in the right forefoot once again. Culture that I gave him in the beginning of May showed Enterobacter and E. coli. I gave him doxycycline and things seem to improve although neither 1 of these organisms was specifically plated. 6/29; left buttock is larger and dry this week. Left lateral calf looks to me to be improved. Left dorsal foot also somewhat improved right foot completely unchanged. The erythema on the right foot is still present. He is completing the Ceftin dinner that I gave him empirically [see discussion above.) 7/6 - All wounds look to be stable and perhaps improved, the left buttock wound is slightly smaller, per patient bleeds easily, completed ceftin, the right foot redness is less, he is on terbinafine 7/13; left buttock wound about the same perhaps slightly narrower. Area on the left lateral leg continues to narrow. Left dorsal foot slightly smaller right foot about the same. We are using silver alginate on the  right foot and Hydrofera Blue to the areas on the left. Unna boot on the left 2 layer compression on the right 7/20; left buttock wound absolutely the same. Area on lateral leg continues to get better. Left dorsal foot require debridement as did the right no major change in the 7/27; left buttock wound the same size necrotic debris over the surface. The area on the lateral leg is closed once again. His left foot looks better right foot about the same although there is some involvement now of the posterior first second toe area. He is still on terbinafine which I have given him for a month, not certain a centimeter major change 06/25/19-All wounds appear to be slightly improved according to report, left buttock wound looks clean, both foot wounds have minimal to no debris the right dorsal foot has minimal slough. We are using Hydrofera Blue to the left and silver alginate to the right foot and ischial wound. 8/10-Wounds all appear to be around the same, the right forefoot distal part has some redness which was not there before, however the wound looks clean and small. Ischial wound looks about the same with no changes 8/17; his wound on the left lateral calf which was his original chronic venous insufficiency wound remains closed. Since I last saw him the areas on the left dorsal foot right dorsal foot generally appear better but require debridement. The area on his left initial tuberosity appears somewhat larger to me perhaps hyper granulated and bleeds very easily. We have been using Hydrofera Blue to the left dorsal foot and silver alginate to everything else 8/24; left lateral calf remains closed. The areas on his dorsal feet on the webspace of the first and second toes bilaterally both look better. The area on the left buttock which is the pressure ulcer stage II slightly smaller. I change the dressing to Hydrofera Blue to all areas 8/31; left lateral calf remains closed. The area on his dorsal  feet bilaterally look better. Using Hydrofera Blue. Still requiring debridement on the left foot. No change in the left buttock pressure ulcers however 9/14; left lateral calf remains closed. Dorsal feet look quite a bit better than 2 weeks ago. Flaking dry skin also a lot better with the ammonium  lactate I gave him 2 weeks ago. The area on the left buttock is improved. He states that his Roho cushion developed a leak and he is getting a new one, in the interim he is offloading this vigorously 9/21; left calf remains closed. Left heel which was a possible DTI looks better this week. He had macerated tissue around the left dorsal foot right foot looks satisfactory and improved left buttock wound. I changed his dressings to his feet to silver alginate bilaterally. Continuing Hydrofera Blue on the left buttock. 9/28 left calf remains closed. Left heel did not develop anything [possible DTI] dry flaking skin on the left dorsal foot. Right foot looks satisfactory. Improved left buttock wound. We are using silver alginate on his feet Hydrofera Blue on the buttock. I have asked him to go back to the Lotrisone on his feet including the wounds and surrounding areas 10/5; left calf remains closed. The areas on the left and right feet about the same. A lot of this is epithelialized however debris over the remaining open areas. He is using Lotrisone and silver alginate. The area on the left buttock using Hydrofera Blue 10/26. Patient has been out for 3 weeks secondary to Covid concerns. He tested negative but I think his wife tested positive. He comes in today with the left foot substantially worse, right foot about the same. Even more concerning he states that the area on his left buttock closed over but then reopened and is considerably deeper in one aspect than it was before [stage III wound] 11/2; left foot really about the same as last week. Quarter sized wound on the dorsal foot just proximal to the first  second toes. Surrounding erythema with areas of denuded epithelium. This is not really much different looking. Did not look like cellulitis this time however. ooRight foot area about the same.. We have been using silver alginate alginate on his toes ooLeft buttock still substantial irritated skin around the wound which I think looks somewhat better. We have been using Hydrofera Blue here. 11/9; left foot larger than last week and a very necrotic surface. Right foot I think is about the same perhaps slightly smaller. Debris around the circumference also addressed. Unfortunately on the left buttock there is been a decline. Satellite lesions below the major wound distally and now a an additional one posteriorly we have been using Hydrofera Blue but I think this is a pressure issue 11/16; left foot ulcer dorsally again a very adherent necrotic surface. Right foot is about the same. Not much change in the pressure ulcer on his left buttock. 11/30; left foot ulcer dorsally basically the same as when I saw him 2 weeks ago. Very adherent fibrinous debris on the wound surface. Patient reports a lot of drainage as well. 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 We have been using Iodoflex, patient changed back to alginate because of the drainage. Area on his right dorsal foot really looks benign with a healthier surface certainly a lot better than on the left. Left buttock wounds all improved using Hydrofera Blue 12/7; left dorsal foot again no improvement. Tightly adherent debris. PCR culture I did last week only showed likely skin contaminant. I have gone ahead and done a punch biopsy of this which is about the last thing in terms of investigations I can think to do. He has known venous insufficiency and venous hypertension and this could be the issue here. The area on the  right foot is about the same left buttock slightly worse according to our intake nurse  secondary to [facility] Blue sticking to the wound 12/14; biopsy of the left foot that I did last time showed changes that could be related to wound healing/chronic stasis dermatitis phenomenon no neoplasm. We have been using silver alginate to both feet. I change the one on the left today to Sorbact and silver alginate to his other 2 wounds 12/28; the patient arrives with the following problems; ooMajor issue is the dorsal left foot which continues to be a larger deeper wound area. Still with a completely nonviable surface ooParadoxically the area mirror image on the right on the right dorsal foot appears to be getting better. ooHe had some loss of dry denuded skin from the lower part of his original wound on the left lateral calf. Some of this area looked a little vulnerable and for this reason we put him in wrap that on this side this week ooThe area on his left buttock is larger. He still has the erythematous circular area which I think is a combination of pressure, sweat. This does not look like cellulitis or fungal dermatitis 11/26/2019; -Dorsal left foot large open wound with depth. Still debris over the surface. Using Sorbact ooThe area on the dorsal right foot paradoxically has closed over [facility] has a reopening on the left ankle laterally at the base of his original wound that extended up into the calf. This appears clean. ooThe left buttock wound is smaller but with very adherent necrotic debris over the surface. We have been using silver alginate here as well The patient had arterial studies done in 2017. He had biphasic waveforms at the dorsalis pedis and posterior tibial bilaterally. ABI in the left was 1.17. Digit waveforms were dampened. He has slight spasticity in the great toes I do not think a TBI would be possible 1/11; the patient comes in today with a sizable reopening between the first and second toes on the right. This is not exactly in the same location where we  have been treating wounds previously. According to our intake nurse this was actually fairly deep but 0.6 cm. The area on the left dorsal foot looks about the same the surface is somewhat cleaner using Sorbact, his MRI is in 2 days. We have not managed yet to get arterial studies. The new reopening on the left lateral calf looks somewhat better using alginate. The left buttock wound is about the same using alginate 1/18; the patient had his ARTERIAL studies which were quite normal. ABI in the right at 1.13 with triphasic/biphasic waveforms on the left ABI 1.06 again with triphasic/biphasic waveforms. It would not have been possible to have done a toe brachial index because of spasticity. We have been using Sorbac to the left foot alginate to the rest of his wounds on the right foot left lateral calf and left buttock 1/25; arrives in clinic with erythema and swelling of the left forefoot worse over the first MTP area. This extends laterally dorsally and but also posteriorly. Still has an area on the left lateral part of the lower part of his calf wound it is eschared and clearly not closed. ooArea on the left buttock still with surrounding irritation and erythema. ooRight foot surface wound dorsally. The area between the right and first and second toes appears better. 2/1; ooThe left foot wound is about the same. Erythema slightly better I gave him a week of doxycycline empirically ooRight foot wound  is more extensive extending between the toes to the plantar surface ooLeft lateral calf really no open surface on the inferior part of his original wound however the entire area still looks vulnerable ooAbsolutely no improvement in the left buttock wound required debridement. 2/8; the left foot is about the same. Erythema is slightly improved I gave him clindamycin last week. ooRight foot looks better he is using Lotrimin and silver alginate ooHe has a breakdown in the left lateral calf. Denuded  epithelium which I have removed ooLeft buttock about the same were using Hydrofera Blue 2/15; left foot is about the same there is less surrounding erythema. Surface still has tightly adherent debris which I have debriding however not making any progress ooRight foot has a substantial wound on the medial right second toe between the first and second webspace. ooStill an open area on the left lateral calf distal area. ooButtock wound is about the same 2/22; left foot is about the same less surrounding erythema. Surface has adherent debris. Polymen Ag Right foot area significant wound between the first and second toes. We have been using silver alginate here Left lateral leg polymen Ag at the base of his original venous insufficiency wound ooLeft buttock some improvement here 3/1; ooRight foot is deteriorating in the first second toe webspace. Larger and more substantial. We have been using silver alginate. ooLeft dorsal foot about the same markedly adherent surface debris using PolyMem Ag ooLeft lateral calf surface debris using PolyMem AG ooLeft buttock is improved again using PolyMem Ag. ooHe is completing his terbinafine. The erythema in the foot seems better. He has been on this for 2 weeks 3/8; no improvement in any wound area in fact he has a small open area on the dorsal midfoot which is new this week. He has not gotten his foot x-rays yet 3/15; his x-rays were both negative for osteomyelitis of both feet. No major change in any of his wounds on the extremities however his buttock wounds are better. We have been using polymen on the buttocks, left lower leg. Iodoflex on the left foot and silver alginate on the right 3/22; arrives in clinic today with the 2 major issues are the improvement in the left dorsal foot wound which for once actually looks healthy with a nice healthy wound surface without debridement. Using Iodoflex here. Unfortunately on the left lateral calf which is in  the distal part of his original wound he came to the clinic here for there was purulent drainage noted some increased breakdown scattered around the original area and a small area proximally. We we are using polymen here will change to silver alginate today. His buttock wound on the left is better and I think the area on the right first second toe webspace is also improved 3/29; left dorsal foot looks better. Using Iodoflex. Left ankle culture from deterioration last time grew E. coli, Enterobacter and Enterococcus. I will give him a course of cefdinir although that will not cover Enterococcus. The area on the right foot in the webspace of the first and second toe lateral first toe looks better. The area on his buttock is about healed Vascular appointment is on April 21. This is to look at his venous system vis--vis continued breakdown of the wounds on the left including the left lateral leg and left dorsal foot he. He has had previous ablations on this side 4/5; the area between the right first and second toes lateral aspect of the first toe looks better. Dorsal  aspect of the left first toe on the left foot also improved. Unfortunately the left lateral lower leg is larger and there is a second satellite wound superiorly. The usual superficial abrasions on the left buttock overall better but certainly not closed 4/12; the area between the right first and second toes is improved. Dorsal aspect of the left foot also slightly smaller with a vibrant healthy looking surface. No real change in the left lateral leg and the left buttock wound is healed He has an unaffordable co-pay for Apligraf. Appointment with vein and vascular with regards to the left leg venous part of the circulation is on 4/21 4/19; we continue to see improvement in all wound areas. Although this is minor. He has his vascular appointment on 4/21. The area on the left buttock has not reopened although right in the center of this area  the skin looks somewhat threatened 4/26; the left buttock is unfortunately reopened. In general his left dorsal foot has a healthy surface and looks somewhat smaller although it was not measured as such. The area between his first and second toe webspace on the right as a small wound against the first toe. The patient saw vascular surgery. The real question I was asking was about the small saphenous vein on the left. He has previously ablated left greater saphenous vein. Nothing further was commented on on the left. Right greater saphenous vein without reflux at the saphenofemoral junction or proximal thigh there was no indication for ablation of the right greater saphenous vein duplex was negative for DVT bilaterally. They did not think there was anything from a vascular surgery point of view that could be offered. They ABIs within normal limits 5/3; only small open area on the left buttock. The area on the left lateral leg which was his original venous reflux is now 2 wounds both which look clean. We are using Iodoflex on the left dorsal foot which looks healthy and smaller. He is down to a very tiny area between the right first and second toes, using silver alginate Objective Constitutional Sitting or standing Blood Pressure is within target range for patient.. Pulse regular and within target range for patient.Marland Kitchen Respirations regular, non-labored and within target range.. Temperature is normal and within the target range for the patient.Marland Kitchen Appears in no distress. Vitals Time Taken: 7:46 AM, Height: 70 in, Weight: 216 lbs, BMI: 31, Temperature: 98.1 F, Pulse: 104 bpm, Respiratory Rate: 18 breaths/min, Blood Pressure: 113/63 mmHg. General Notes: Wound exam ooLeft foot looks smaller healthy tissue I have not changed the Iodoflex ooLeft lateral venous reflux area on the left lateral lower leg part of his original wound is down to 2 small open areas ooLeft buttock only 1 small open area.  Surrounding irritated skin but no cellulitis ooSmall area between the right first and second toes this is a lot better. I cleaned this off with Anasept and gauze but could not get all the slough and debris off the surface I used a #3 curette to remove this. There was no bleeding Integumentary (Hair, Skin) Wound #24 status is Open. Original cause of wound was Gradually Appeared. The wound is located on the Left,Dorsal Foot. The wound measures 1.5cm length x 1.5cm width x 0.2cm depth; 1.767cm^2 area and 0.353cm^3 volume. There is Fat Layer (Subcutaneous Tissue) Exposed exposed. There is no tunneling or undermining noted. There is a medium amount of serosanguineous drainage noted. The wound margin is flat and intact. There is large (67-100%) red granulation within  the wound bed. There is a small (1-33%) amount of necrotic tissue within the wound bed including Adherent Slough. Wound #37 status is Open. Original cause of wound was Gradually Appeared. The wound is located on the Left,Lateral Malleolus. The wound measures 3.4cm length x 1.2cm width x 0.1cm depth; 3.204cm^2 area and 0.32cm^3 volume. There is Fat Layer (Subcutaneous Tissue) Exposed exposed. There is no tunneling or undermining noted. There is a medium amount of serosanguineous drainage noted. The wound margin is flat and intact. There is large (67-100%) red granulation within the wound bed. There is no necrotic tissue within the wound bed. Wound #38 status is Open. Original cause of wound was Gradually Appeared. The wound is located on the Right T - Web between 1st and 2nd. The wound oe measures 0.5cm length x 0.2cm width x 0.2cm depth; 0.079cm^2 area and 0.016cm^3 volume. There is Fat Layer (Subcutaneous Tissue) Exposed exposed. There is no tunneling or undermining noted. There is a small amount of serosanguineous drainage noted. The wound margin is flat and intact. There is large (67- 100%) red granulation within the wound bed. There is no  necrotic tissue within the wound bed. Wound #40 status is Open. Original cause of wound was Gradually Appeared. The wound is located on the Left,Lateral Lower Leg. The wound measures 0.5cm length x 0.3cm width x 0.1cm depth; 0.118cm^2 area and 0.012cm^3 volume. There is Fat Layer (Subcutaneous Tissue) Exposed exposed. There is no tunneling or undermining noted. There is a small amount of serosanguineous drainage noted. The wound margin is flat and intact. There is large (67-100%) pink granulation within the wound bed. There is a small (1-33%) amount of necrotic tissue within the wound bed including Adherent Slough. Wound #41 status is Open. Original cause of wound was Gradually Appeared. The wound is located on the Left Ischium. The wound measures 0.1cm length x 0.1cm width x 0.1cm depth; 0.008cm^2 area and 0.001cm^3 volume. There is Fat Layer (Subcutaneous Tissue) Exposed exposed. There is no tunneling or undermining noted. There is a medium amount of serosanguineous drainage noted. The wound margin is flat and intact. There is large (67-100%) red granulation within the wound bed. There is no necrotic tissue within the wound bed. 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 There was a Selective/Open Wound Non- oe Viable Tissue Debridement with a total area of 0.1 sq cm performed by Ricard Dillon., MD. With the following instrument(s): Curette to remove Non- Viable tissue/material. Material  removed includes [place]. No specimens were taken. A time out was conducted at 08:23, prior to the start of the procedure. A Minimum amount of bleeding was controlled with Pressure. The procedure was tolerated well with a pain level of 0 throughout and a pain level of 0 following the procedure. Post Debridement Measurements: 0.5cm length x 0.2cm width x 0.2cm depth; 0.016cm^3 volume. Character of Wound/Ulcer Post Debridement is improved. Post procedure Diagnosis Wound #38: Same as Pre-Procedure Wound #37 Pre-procedure diagnosis of Wound #37 is a Venous Leg Ulcer located on the Left,Lateral Malleolus .  There was a Four Layer Compression Therapy Procedure by Levan Hurst, RN. Post procedure Diagnosis Wound #37: Same as Pre-Procedure Wound #40 Pre-procedure diagnosis of Wound #40 is a Venous Leg Ulcer located on the Left,Lateral Lower Leg . There was a Four Layer Compression Therapy Procedure by Levan Hurst, RN. Post procedure Diagnosis Wound #40: Same as Pre-Procedure Plan Follow-up Appointments: Return Appointment in 1 week. Dressing Change Frequency: Wound #24 Left,Dorsal Foot: Do not change entire dressing for one week. Wound #37 Left,Lateral Malleolus: Do not change entire dressing for one week. Wound #38 Right T - Web between 1st and 2nd: oe Change Dressing every other day. Wound #40 Left,Lateral Lower Leg: Do not change entire dressing for one week. Wound #41 Left Ischium: Change Dressing every other day. Skin Barriers/Peri-Wound Care: Antifungal cream - on toes on both feet daily Barrier cream - to leg/ankle and left foot Moisturizing lotion - both legs Other: - Triamcinolone cream Primary Wound Dressing: Wound #24 Left,Dorsal Foot: Iodoflex Wound #37 Left,Lateral Malleolus: Hydrofera Blue Wound #38 Right T - Web between 1st and 2nd: oe Calcium Alginate with Silver Wound #40 Left,Lateral Lower Leg: Hydrofera Blue Wound #41 Left Ischium: Calcium Alginate with  Silver Secondary Dressing: Wound #24 Left,Dorsal Foot: Dry Gauze ABD pad Wound #37 Left,Lateral Malleolus: Dry Gauze ABD pad Wound #38 Right T - Web between 1st and 2nd: oe Kerlix/Rolled Gauze - secure with tape Dry Gauze Wound #40 Left,Lateral Lower Leg: Dry Gauze ABD pad Wound #41 Left Ischium: Dry Gauze ABD pad Edema Control: 4 layer compression: Left lower extremity Elevate legs to the level of the heart or above for 30 minutes daily and/or when sitting, a frequency of: - throughout the day Support Garment 30-40 mm/Hg pressure to: - Juxtalite to right leg Off-Loading: Low air-loss mattress (Group 2) Roho cushion for wheelchair Turn and reposition every 2 hours - out of wheelchair throughout the day, try to lay on sides, sleep in the bed not the recliner 1. Continue silver alginate between the right first and second toes and the right buttock which is just above closed 2. Iodoflex to continue left dorsal foot he continues to make good improvement here. 3. Hydrofera Blue to the left lateral leg again good improvement. 4. The patient saw vascular surgery did not think anything else could be done to the left leg in terms of ablation Electronic Signature(s) Signed: 03/24/2020 5:33:20 PM By: Linton Ham MD Entered By: Linton Ham on 03/24/2020 08:31:22 -------------------------------------------------------------------------------- SuperBill Details Patient Name: Date of Service: Beth, [name] 03/24/2020 Medical Record Number: [id number] Patient Account Number: [id number] Date of Birth/Sex: Treating RN: 1986/07/30 (34 y.o. 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 L97.521 Non-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 L97.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, 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, complete Facility Procedures CPT4 Code: [id number] Description: [id number] - DEBRIDE WOUND 1ST 20 SQ CM OR < ICD-10 Diagnosis Description L97.511 Non-pressure chronic ulcer of other part of right foot limited to breakdown of skin Modifier: Quantity: 1 CPT4 Code: [id number] Description: (Facility Use Only) [id number] - APPLY MULTLAY COMPRS LWR LT LEG Modifier: 56  Quantity: 1 Physician Procedures : CPT4 Code Description Modifier [id number] - WC PHYS DEBR WO ANESTH 20 SQ CM ICD-10 Diagnosis Description L97.511 Non-pressure chronic ulcer of other part of right foot limited to breakdown of skin Quantity: 1 Electronic Signature(s) Signed: 03/24/2020 5:27:06 PM By: Levan Hurst RN, BSN Signed: 03/24/2020 5:33:20 PM By: Linton Ham MD Entered By: Levan Hurst on 03/24/2020 08:41:06

## 2020-03-31 ENCOUNTER — Encounter (HOSPITAL_BASED_OUTPATIENT_CLINIC_OR_DEPARTMENT_OTHER): Payer: BC Managed Care – PPO | Admitting: Internal Medicine

## 2020-03-31 ENCOUNTER — Other Ambulatory Visit: Payer: Self-pay

## 2020-03-31 DIAGNOSIS — G8221 Paraplegia, complete: Secondary | ICD-10-CM | POA: Diagnosis not present

## 2020-03-31 DIAGNOSIS — Z87891 Personal history of nicotine dependence: Secondary | ICD-10-CM | POA: Diagnosis not present

## 2020-03-31 DIAGNOSIS — L97321 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 to breakdown of skin: Secondary | ICD-10-CM | POA: Diagnosis not present

## 2020-03-31 DIAGNOSIS — L97521 Non-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: Secondary | ICD-10-CM | POA: Diagnosis not present

## 2020-03-31 DIAGNOSIS — L89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, stage 3: Secondary | ICD-10-CM | POA: Diagnosis not present

## 2020-03-31 DIAGNOSIS — I87332 Chronic venous hypertension (idiopathic) with ulcer and inflammation of left lower extremity: Secondary | ICD-10-CM | POA: Diagnosis not present

## 2020-03-31 DIAGNOSIS — L97511 Non-pressure chronic ulcer of other part of right foot limited to breakdown of skin: Secondary | ICD-10-CM | POA: Diagnosis not present

## 2020-04-01 NOTE — Progress Notes (Signed)
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 are better. 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 Problematic areas on the plantar aspect of the first second toe web space on the left and the dorsal aspect of the first second toe web space on the right. At one point I felt these were probably related to chronic fungal infections in treated him aggressively for this although we have not made any improvement here. 08/11/17; left lateral leg. Surface continues to improve with the Iodoflex although we are not seeing much improvement in overall wound dimensions. Areas on his plantar left foot and right foot show no improvement. In fact the right foot looks somewhat worse 08/18/17; left lateral leg. We changed to [facility] Blue last week after [name] prolonged course of Iodoflex which helps get the surface better. It appears that the wound with is improved. Continue with difficult areas on the left dorsal first second and plantar first second on the right  09/01/17; patient arrives in clinic today having had [name] temperature of 103 yesterday. He was seen in the ER and [facility]. The patient was concerned he could have cellulitis again in the right leg however they diagnosed him with [name] UTI and he is now on Keflex. He has [name] history of cellulitis which is been recurrent and difficult but this is been in the left leg, in the past 5 use doxycycline. He does in and out catheterizations at home which are risk factors for UTI 09/08/17; patient will be completing his Keflex this weekend. The erythema on the left leg is considerably better. He has [name] new  wound today on the medial part of the right leg small superficial almost looks like [name] skin tear. He has worsening of the area on the right dorsal first and second toe. His major area on the left lateral leg is better. Using Hydrofera Blue on all areas 09/15/17; gradual reduction in width on the long wound in the left lateral leg. No debridement required. He also has wounds on the plantar aspect of his left first second toe web space and on the dorsal aspect of the right first second toe web space. 09/22/17; there continues to be very gradual improvements in the dimensions of the left lateral leg wound. He hasn't round erythematous spot with might be pressure on his wheelchair. There is no evidence obviously of infection no purulence no warmth He has [name] dry scaled area on the plantar aspect of the left first second toe Improved area on the dorsal right first second toe. 09/29/17; left lateral leg wound continues to improve in dimensions mostly with an is still [name] fairly long but increasingly narrow wound. He has [name] dry scaled area on the plantar aspect of his left first second toe web space Increasingly concerning area on the dorsal right first second toe. In fact I am concerned today about possible cellulitis around this wound. The areas extending up his second toe and although there is deformities here almost appears to abut on the nailbed. 10/06/17; left lateral leg wound continues to make very gradual progress. Tissue culture I did from the right first second toe dorsal foot last time grew MRSA and enterococcus which was vancomycin sensitive. This was not sensitive to clindamycin or doxycycline. He is allergic to Zyvox and sulfa we have therefore arrange for him to have dalvance infusion tomorrow. He is had this in the past and tolerated it well 10/20/17; left lateral leg wound continues to make decent progress. This is certainly reduced in terms of with there is advancing epithelialization.The  cellulitis in the right foot looks better although he still has [name] deep wound in the dorsal aspect of the first second toe web space. Plantar left first toe web space on the left I think is making some progress 10/27/17; left lateral leg wound continues to make decent progress. Advancing epithelialization.using Hydrofera Blue The right first second toe web space wound is better-looking using silver alginate Improvement in the left plantar first second toe web space. Again using silver alginate 11/03/17 left lateral leg wound continues to make decent progress albeit slowly. Using [facility] The right per second toe web space continues to be [name] very problematic looking punched out wound. I obtained [name] piece of tissue for deep culture I did extensively treated this for fungus. It is difficult to imagine that this is [name] pressure area as the patient states other than going outside he doesn't really wear shoes at home The  left plantar first second toe web space looked fairly senescent. Necrotic edges. This required debridement change to [facility] Blue to all wound areas 11/10/17; left lateral leg wound continues to contract. Using Hydrofera Blue On the right dorsal first second toe web space dorsally. Culture I did of this area last week grew MRSA there is not an easy oral option in this patient was multiple antibiotic allergies or intolerances. This was only [name] rare culture isolate I'm therefore going to use Bactroban under silver alginate On the left plantar first second toe web space. Debridement is required here. This is also unchanged 11/17/17; left lateral leg wound continues to contract using Hydrofera Blue this is no longer the major issue. The major concern here is the right first second toe web space. He now has an open area going from dorsally to the plantar aspect. There is now wound on the inner lateral part of the first toe. Not [name] very viable surface on this. There is erythema spreading  medially into the forefoot. No major change in the left first second toe plantar wound 11/24/17; left lateral leg wound continues to contract using Hydrofera Blue. Nice improvement today The right first second toe web space all of this looks [name] lot less angry than last week. I have given him clindamycin and topical Bactroban for MRSA and terbinafine for the possibility of underlining tinea pedis that I could not control with ketoconazole. Looks somewhat better The area on the plantar left first second toe web space is weeping with dried debris around the wound 12/01/17; left lateral leg wound continues to contract he Hydrofera Blue. It is becoming thinner in terms of with nevertheless it is making good improvement. The right first second toe web space looks less angry but still [name] large necrotic-looking wounds starting on the plantar aspect of the right foot extending between the toes and now extensively on the base of the right second toe. I gave him clindamycin and topical Bactroban for MRSA anterior benefiting for the possibility of underlying tinea pedis. Not looking better today The area on the left first/second toe looks better. Debrided of necrotic debris 12/05/17* the patient was worked in urgently today because over the weekend he found blood on his incontinence bad when he woke up. He was found to have an ulcer by his wife who does most of his wound care. He came in today for [place] to look at this. He has not had [name] history of wounds in his buttocks in spite of his paraplegia. 12/08/17; seen in follow-up today at his usual appointment. He was seen earlier this week and found to have [name] new wound on his buttock. We also follow him for wounds on the left lateral leg, left first second toe web space and right first second toe web space 12/15/17; we have been using Hydrofera Blue to the left lateral leg which has improved. The right first second toe web space has also improved. Left first second toe web  space plantar aspect looks stable. The left buttock has worsened using Santyl. Apparently the buttock has drainage 12/22/17; we have been using Hydrofera Blue to the left lateral leg which continues to improve now 2 small wounds separated by normal skin. He tells [place] he had [name] fever up to 100 yesterday he is prone to UTIs but has not noted anything different. He does in and out catheterizations. The area between the first and second toes today does not look good necrotic surface covered with what looks  to be purulent drainage and erythema extending into the third toe. I had gotten this to something that I thought look better last time however it is not look good today. He also has [name] necrotic surface over the buttock wound which is expanded. I thought there might be infection under here so I removed [name] lot of the surface with [name] #5 curet though nothing look like it really needed culturing. He is been using Santyl to this area 12/27/17; his original wound on the left lateral leg continues to improve using Hydrofera Blue. I gave him samples of Baxdella although he was unable to take them out of fear for an allergic reaction ["lump in his throat"].the culture I did of the purulent drainage from his second toe last week showed both enterococcus and [name] set Enterobacter I was also concerned about the erythema on the bottom of his foot although paradoxically although this looks somewhat better today. 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 We've been using silver alginate here. They came up today that he sleeps in [name] recliner, I'm not sure why but I asked him to stop this 01/03/18; his original wound we've been using Hydrofera Blue is now separated into 2 areas. Ulcer on his left buttock is better he is off the recliner and sleeping in bed Finally both wound areas between his first and second toes also looks some  better 01/10/18; his original wound on the left lateral leg is now separated into 2 wounds we've been using Hydrofera Blue Ulcer on his left buttock has some drainage. There is [name] small probing site going into muscle layer superiorly.using silver alginate -He arrives today with [name] deep tissue injury on the left heel The wound on the dorsal aspect of his first second toe on the left looks [name] lot betterusing silver alginate ketoconazole The area on the first second toe web space on the right also looks [name] lot bette 01/17/18; his original wound on the left lateral leg continues to progress using Hydrofera Blue Ulcer on his left buttock also is smaller surface healthier except for [name] small probing site going into the muscle layer superiorly. 2.4 cm of tunneling in this area DTI on his left heel we have only been offloading. Looks better than last week no threatened open no evidence of infection the wound on the dorsal aspect of the first second toe on the left continues to look like it's regressing we have only been using silver alginate and terbinafine orally The area in the first second toe web space on the right also looks to be [name] lot better using silver alginate and terbinafine I think this was prompted by tinea pedis 01/31/18; the patient was hospitalized in [place] last week apparently for [name] complicated UTI. He was discharged on cefepime he does in and out catheterizations. In the hospital he was discovered M I don't mild elevation of AST and ALT and the terbinafine was stopped.predictably the pressure ulcer on s his buttock looks betterusing silver alginate. The area on the left lateral leg also is better using Hydrofera Blue. The area between the first and second toes on the left better. First and second toes on the right still substantial but better. Finally the DTI on the left heel has held together and looks like it's resolving 02/07/18-he is here in follow-up evaluation for multiple ulcerations.  He has new injury to the lateral aspect of  the last issue [name] pressure ulcer, he states this is from adhesive removal trauma. He states he has tried multiple adhesive products with no success. All other ulcers appear stable. The left heel DTI is resolving. We will continue with same treatment plan and follow-up next week. 02/14/18; follow-up for multiple areas. He has [name] new area last week on the lateral aspect of his pressure ulcer more over the posterior trochanter. The original pressure ulcer looks quite stable has healthy granulation. 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 Almost fully epithelialized on the original superior area using Hydrofera Blue DTI on the left heel has peeled off this week to reveal [name] small superficial wound under denuded skin and subcutaneous tissue Both areas between the first and second toes look better including nothing open on the left 02/21/18; The patient's wounds on his left ischial tuberosity and posterior left greater trochanter actually looked better. He has [name] large area of irritation around the area which I think is contact dermatitis. I am doubtful that this is fungal His original wound on the left lateral calf continues to improve we have been using Hydrofera Blue There is no open area in the left first second toe web space although there is [name] lot of thick callus The DTI on the left heel required debridement today of necrotic surface eschar and subcutaneous tissue using silver alginate Finally the area on the right first second toe webspace continues to contract using silver alginate and ketoconazole 02/28/18 Left ischial tuberosity wounds look better using silver alginate. Original wound on the left calf only has one small open area left using Hydrofera Blue DTI on the left heel required debridement mostly removing skin from around this wound surface. Using  silver alginate The areas on the right first/second toe web space using silver alginate and ketoconazole 03/08/18 on evaluation today patient appears to be doing decently well as best I can tell in regard to his wounds. This is the first time that I have seen him as he generally is followed by [name] With that being said none of his wounds appear to be infected he does have an area where there is some skin covering what appears to be [name] new wound on the left dorsal surface of his great toe. This is right at the nail bed. With that being said I do believe that debrided away some of the excess skin can be of benefit in this regard. Otherwise he has been tolerating the dressing changes without complication. 03/14/18; patient arrives today with the multiplicity of wounds that we are following. He has not been systemically unwell Original wound on the left lateral calf now only has 2 small open areas we've been using Hydrofera Blue which should continue The deep tissue injury on the left heel requires debridement today. We've been using silver alginate The left first second toe and the right first second toe are both are reminiscence what I think was tinea pedis. Apparently some of the callus Surface between the toes was removed last week when it started draining. Purulent drainage coming from the wound on the ischial tuberosity on the left. 03/21/18-He is here in follow-up evaluation for multiple wounds. 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 He tolerated debridement. The only change to last week's recommendations is to discontinue antifungal cream between toes. He will follow-up next week  03/28/18; following up for multiple wounds;Concern this week is streaking redness and swelling in the right foot. He is going to need antibiotics for this. 03/31/18; follow-up for right foot cellulitis. Streaking redness and swelling in the  right foot on 03/28/18. 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 I put him on clindamycin 300 mg every 6 and brought him in for [name] quick check. He has an open wound between his first and second toes on the right foot as [name] potential source. 04/04/18; Right foot cellulitis is resolving he is completing clindamycin. This is truly good news Left lateral calf wound which is initial wound only has one small open area inferiorly this is close to healing out. He has compression stockings. We will use Hydrofera Blue right down to the epithelialization of this Nonviable surface on the left heel which was initially pressure with [name] DTI. We've been using Hydrofera Blue. I'm going to switch this back to silver alginate Left first second toe/tinea pedis this looks better using silver alginate Right first second toe tinea pedis using silver alginate Large pressure ulcers on theLeft ischial tuberosity. Small wound here Looks better. I am uncertain about the surface over the large wound. Using silver alginate 04/11/18; Cellulitis in the right foot is resolved Left lateral calf wound which was his original wounds still has 2 tiny open areas remaining this is just about closed Nonviable surface on the left heel is better but still requires debridement Left first second toe/tinea pedis still open using silver alginate Right first second toe wound tinea pedis I asked him to go back to using ketoconazole and silver alginate Large pressure ulcers on the left ischial tuberosity this shear injury here is resolved. Wound is smaller. No evidence of infection using silver alginate 04/18/18; Patient arrives with an intense area of cellulitis in the right mid lower calf extending into the right heel area. Bright red and warm. Smaller area on the left anterior leg. He has [name] significant history of MRSA. He will definitely need antibioticsdoxycycline He now has 2 open areas on the left ischial tuberosity the  original large wound and now [name] satellite area which I think was above his initial satellite areas. Not [name] wonderful surface on this satellite area surrounding erythema which looks like pressure related. His left lateral calf wound again his original wound is just about closed Left heel pressure injury still requiring debridement Left first second toe looks [name] lot better using silver alginate Right first second toe also using silver alginate and ketoconazole cream also looks better 04/20/18; the patient was worked in early today out of concerns with his cellulitis on the right leg. I had started him on doxycycline. This was 2 days ago. His wife was concerned about the swelling in the area. Also concerned about the left buttock. He has not been systemically unwell no fever chills. No nausea vomiting or diarrhea 04/25/18; the patient's left buttock wound is continued to deteriorate he is using Hydrofera Blue. He is still completing clindamycin for the cellulitis on the right leg although all of this looks better. 05/02/18 Left buttock wound still with [name] lot of drainage and [name] very tightly adherent fibrinous necrotic surface. He has [name] deeper area superiorly The left lateral calf wound is still closed DTI wound on the left heel necrotic surface especially the circumference using Iodoflex Areas between his left first second toe and right first second toe both look better. Dorsally and the right first second toe he  had [name] necrotic surface although at smaller. In using silver alginate and ketoconazole. I did [name] culture last week which was [name] deep tissue culture of the reminiscence of the open wound on the right first second toe dorsally. This grew [name] few Acinetobacter and [name] few methicillin-resistant staph aureus. Nevertheless the area actually this week looked better. I didn't feel the need to specifically address this at least in terms of systemic antibiotics. 05/09/18; wounds are measuring larger more drainage per  our intake. We are using Santyl covered with alginate on the large superficial buttock wounds, Iodosorb on the left heel, ketoconazole and silver alginate to the dorsal first and second toes bilaterally. 05/16/18; The area on his left buttock better in some aspects although the area superiorly over the ischial tuberosity required an extensive debridement.using Santyl Left heel appears stable. Using Iodoflex The areas between his first and second toes are not bad however there is spreading erythema up the dorsal aspect of his left foot this looks like cellulitis again. He is insensate the erythema is really very brilliant.o Erysipelas He went to see an allergist days ago because he was itching part of this he had lab work done. This showed [name] white count of 15.1 with 70% neutrophils. Hemoglobin of 11.4 and [name] platelet count of 659,000. Last white count we had in Epic was [name] 2-1/2 years ago which was 25.9 but he was ill at the time. He was able to show me some lab work that was done by his primary physician the pattern is about the same. 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 But prompted me to go ahead and do x-rays of both feet and the pelvis rule out osteomyelitis. He also had [name] comprehensive metabolic panel this was reasonably normal his albumin was 3.7 liver function tests BUN/creatinine all normal 05/23/18; x-rays of both his feet from last week were negative for underlying pulmonary abnormality. The x-ray of his pelvis however showed mild irregularity in the left ischial which may represent some early osteomyelitis. The wound in the left ischial continues to get deeper clearly now exposed muscle. Each week necrotic surface material over this area. Whereas the rest of the wounds do not look so bad. The left ischial wound we have been using Santyl and calcium alginate T the left heel surface necrotic debris using Iodoflex o The left lateral leg is still  healed Areas on the left dorsal foot and the right dorsal foot are about the same. There is some inflammation on the left which might represent contact dermatitis, fungal dermatitis I am doubtful cellulitis although this looks better than last week 05/30/18; CT scan done at Hospital did not show any osteomyelitis or abscess. Suggested the possibility of underlying cellulitis although I don't see [name] lot of evidence of this at the bedside The wound itself on the left buttock/upper thigh actually looks somewhat better. No debridement Left heel also looks better no debridement continue Iodoflex Both dorsal first second toe spaces appear better using Lotrisone. Left still required debridement 06/06/18; Intake reported some purulent looking drainage from the left gluteal wound. Using Santyl and calcium alginate Left heel looks better although still [name] nonviable surface requiring debridement The left dorsal foot first/second webspace actually expanding and somewhat deeper. I may consider doing [name] shave biopsy of this area Right dorsal foot first/second webspace appears stable to improved. Using Lotrisone and silver alginate to both these areas 06/13/18 Left gluteal surface looks  better. Now separated in the 2 wounds. No debridement required. Still drainage. We'll continue silver alginate Left heel continues to look better with Iodoflex continue this for at least another week Of his dorsal foot wounds the area on the left still has some depth although it looks better than last week. We've been using Lotrisone and silver alginate 06/20/18 Left gluteal continues to look better healthy tissue Left heel continues to look better healthy granulation wound is smaller. He is using Iodoflex and his long as this continues continue the Iodoflex Dorsal right foot looks better unfortunately dorsal left foot does not. There is swelling and erythema of his forefoot. He had minor trauma to this several days ago but doesn't  think this was enough to have caused any tissue injury. Foot looks like cellulitis, we have had this problem before 06/27/18 on evaluation today patient appears to be doing [name] little worse in regard to his foot ulcer. 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 Both of which would really be his only options for treating this infection. In the past he has been given and effusion of Orbactiv. This is done very well for him in the past again it's one time dosing IV antibiotic therapy. Subsequently I do believe this is something we're gonna need to see about doing at this point in time. Currently his other wounds seem to be doing somewhat better in my pinion I'm pretty happy in that regard. 07/03/18 on evaluation today patient's wounds actually appear to be doing fairly well. He has been tolerating the dressing changes without complication. All in all he seems to be showing signs of improvement. In regard to the antibiotics he has been dealing with infectious disease since I saw him last week as far as getting this scheduled. In the end he's going to be [facility] done this coming Friday. In the meantime he has been continuing to perform the dressing changes in such as previous. There does not appear to be any evidence of infection worsengin at this time. 07/10/18; Since I last saw this man 2 [name] ago things have actually improved. IV antibiotics of resulted in less forefoot erythema although there is still some present. He is not systemically unwell Left buttock wounds 2 now have no depth there is increased epithelialization Using silver alginate Left heel still requires debridement using Iodoflex Left dorsal foot still with [name] sizable wound about the size of [name] border but healthy granulation Right dorsal foot still with [name]  slitlike area using silver alginate 07/18/18; the patient's cellulitis in the left foot is improved in fact I think it is on its way to resolving. Left buttock wounds 2 both look better although the larger one has hypertension granulation we've been using silver alginate Left heel has some thick circumferential redundant skin over the wound edge which will need to be removed today we've been using Iodoflex Left dorsal foot is still [name] sizable wound required debridement using silver alginate The right dorsal foot is just about closed only [name] small open area remains here 07/25/18; left foot cellulitis is resolved Left buttock wounds 2 both look better. Hyper-granulation on the major area Left heel as some debris over the surface but otherwise looks [name] healthier wound. Using silver collagen Right dorsal foot is just about closed 07/31/18; arrives with  our intake nurse worried about purulent drainage from the buttock. We had hyper-granulation here last week His buttock wounds 2 continue to look better Left heel some debris over the surface but measuring smaller. Right dorsal foot unfortunately has openings between the toes Left foot superficial wound looks less aggravated. 08/07/18 Buttock wounds continue to look better although some of her granulation and the larger medial wound. silver alginate Left heel continues to look [name] lot better.silver collagen Left foot superficial wound looks less stable. Requires debridement. He has [name] new wound superficial area on the foot on the lateral dorsal foot. Right foot looks better using silver alginate without Lotrisone 08/14/2018; patient was in the ER last week diagnosed with [name] UTI. He is now on Cefpodoxime and Macrodantin. Buttock wounds continued to be smaller. Using silver alginate Left heel continues to look better using silver collagen Left foot superficial wound looks as though it is improving Right dorsal foot area is just about healed. 08/21/2018; patient is  completed his antibiotics for his UTI. He has 2 open areas on the buttocks. There is still not closed although the surface looks satisfactory. Using silver alginate Left heel continues to improve using silver collagen The bilateral dorsal foot areas which are at the base of his first and second toes/possible tinea pedis are actually stable on the left but worse on the right. The area on the left required debridement of necrotic surface. After debridement I obtained [name] specimen for PCR culture. The right dorsal foot which is been just about healed last week is now reopened 08/28/2018; culture done on the left dorsal foot showed coag negative staph both staph epidermidis and Lugdunensis. I think this is worthwhile initiating systemic treatment. I will use doxycycline given his long list of allergies. The area on the left heel slightly improved but still requiring debridement. The large wound on the buttock is just about closed whereas the smaller one is larger. Using silver alginate in this area 09/04/2018; patient is completing his doxycycline for the left foot although this continues to be [name] very difficult wound area with very adherent necrotic debris. We are using silver alginate to all his wounds right foot left foot and the small wounds on his buttock, silver collagen on the left heel. 09/11/2018; once again this patient has intense erythema and swelling of the left forefoot. Lesser degrees of erythema in the right foot. He has [name] long list of allergies and intolerances. I will reinstitute doxycycline. 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 Using silver alginate Left heel also looks better using silver collagen Unfortunately both the areas on his feet look worse. The area on the left first second webspace is now gone through to the plantar part of his foot. The area on the left foot anteriorly is irritated with erythema and swelling in the  forefoot. 09/25/2018 His wound on the left plantar heel looks better. Using silver collagen The area on the left buttock 2 small remnant areas. One is closed one is still open. Using silver alginate The areas between both his first and second toes look worse. This in spite of long-standing antifungal therapy with ketoconazole and silver alginate which should have antifungal activity He has small areas around his original wound on the left calf one is on the bottom of the original scar tissue and one superiorly both of these are small and superficial but again given wound history in this site this is  worrisome 10/02/2018 Left plantar heel continues to gradually contract using silver collagen Left buttock wound is unchanged using silver alginate The areas on his dorsal feet between his first and second toes bilaterally look about the same. I prescribed clindamycin ointment to see if we can address chronic staph colonization and also the underlying possibility of erythrasma The left lateral lower extremity wound is actually on the lateral part of his ankle. Small open area here. We have been using silver alginate 10/09/2018; Left plantar heel continues to look healthy and contract. No debridement is required Left buttock slightly smaller with [name] tape injury wound just below which was new this week Dorsal feet somewhat improved I have been using clindamycin Left lateral looks lower extremity the actual open area looks worse although [name] lot of this is epithelialized. I am going to change to silver collagen today He has [name] lot more swelling in the right leg although this is not pitting not red and not particularly warm there is [name] lot of spasm in the right leg usually indicative of people with paralysis of some underlying discomfort. We have reviewed his vascular status from 2017 he had [name] left greater saphenous vein ablation. I wonder about referring him back to vascular surgery if the area on the left  leg continues to deteriorate. 10/16/2018 in today for follow-up and management of multiple lower extremity ulcers. His left Buttock wound is much lower smaller and almost closed completely. The wound to the left ankle has began to reopen with Epithelialization and some adherent slough. He has multiple new areas to the left foot and leg. The left dorsal foot without much improvement. Wound present between left great webspace and 2nd toe. Erythema and edema present right leg. Right LE ultrasound obtained on 10/10/18 was negative for DVT . 10/23/2018; Left buttock is closed over. Still dry macerated skin but there is no open wound. I suspect this is chronic pressure/moisture Left lateral calf is quite [name] bit worse than when I saw this last. There is clearly drainage here he has macerated skin into the left plantar heel. We will change the primary dressing to alginate Left dorsal foot has some improvement in overall wound area. Still using clindamycin and silver alginate Right dorsal foot about the same as the left using clindamycin and silver alginate The erythema in the right leg has resolved. He is DVT rule out was negative Left heel pressure area required debridement although the wound is smaller and the surface is health 10/26/2018 The patient came back in for his nurse check today predominantly because of the drainage coming out of the left lateral leg with [name] recent reopening of his original wound on the left lateral calf. He comes in today with [name] large amount of surrounding erythema around the wound extending from the calf into the ankle and even in the area on the dorsal foot. He is not systemically unwell. He is not febrile. Nevertheless this looks like cellulitis. We have been using silver alginate to the area. I changed him to [name] regular visit and I am going to prescribe him doxycycline. The rationale here is [name] long list of medication intolerances and [name] history of MRSA. I did not see anything  that I thought would provide [name] valuable culture 10/30/2018 Follow-up from his appointment 4 days ago with really an extensive area of cellulitis in the left calf left lateral ankle and left dorsal foot. I put him on doxycycline. He has [name] long list of medication allergies  which are true allergy reactions. 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 In any case he is [name] lot better today. The erythema especially in the anterior and lateral left calf is better. He still has left ankle erythema. He also is complaining about increasing edema in the right leg we have only been using Kerlix Coban and he has been doing the wraps at home. Finally he has [name] spotty rash on the medial part of his upper left calf which looks like folliculitis or perhaps wrap occlusion type injury. Small superficial macules not pustules 11/06/18 patient arrives today with again [name] considerable degree of erythema around the wound on the left lateral calf extending into the dorsal ankle and dorsal foot. This is [name] lot worse than when I saw this last week. He is on doxycycline really with not [name] lot of improvement. He has not been systemically unwell Wounds on the; left heel actually looks improved. Original area on the left foot and proximity to the first and second toes looks about the same. He has superficial areas on the dorsal foot, anterior calf and then the reopening of his original wound on the left lateral calf which looks about the same The only area he has on the right is the dorsal webspace first and second which is smaller. He has [name] large area of dry erythematous skin on the left buttock small open area here. 11/13/2018; the patient arrives in much better condition. The erythema around the wound on the left lateral calf is [name] lot better. 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 In any case this is [name] lot better. The area on the left heel is very small and just about resolved using silver collagen we have been using silver alginate to the areas on his dorsal feet 11/20/2018; his wounds include the left lateral calf, left heel, dorsal aspects of both feet just proximal to the first second webspace. He is stable to slightly improved. I did not think any changes to his dressings were going to be necessary 11/27/2018 he has [name] reopening on the left buttock which is surrounded by what looks like tinea or perhaps some other form of dermatitis. The area on the left dorsal foot has some erythema around it I have marked this area but I am not sure whether this is cellulitis or not. Left heel is not closed. Left calf the reopening is really slightly longer and probably worse 1/13; in general things look better and smaller except for the left dorsal foot. Area on the left heel is just about closed, left buttock looks better only [name] small wound remains in the skin looks better [using Lotrisone] 1/20; the area on the left heel only has [name] few remaining open areas here. Left lateral calf about the same in terms of size, left dorsal foot slightly larger right lateral foot still not closed. The area on the left buttock has no open wound and the surrounding skin looks [name] lot better 1/27; the area on the left heel is closed. Left lateral calf better but still requiring extensive debridements. The area on his left buttock is closed. He still has the open areas on the left dorsal foot which is slightly smaller in the right foot which is slightly expanded. We have been using Iodoflex on these areas as well 2/3; left heel is  closed. Left lateral calf still requiring debridement using Iodoflex there is no open area on his left buttock however he has dry scaly skin over [name] large area of this. Not really responding well to the Lotrisone. Finally the areas on his dorsal feet at the level of the  first second webspace are slightly smaller on the right and about the same on the left. Both of these vigorously debrided with Anasept and gauze 2/10; left heel remains closed he has dry erythematous skin over the left buttock but there is no open wound here. Left lateral leg has come in and with. Still requiring debridement we have been using Iodoflex here. Finally the area on the left dorsal foot and right dorsal foot are really about the same extremely dry callused fissured areas. He does not yet have [name] dermatology appointment 2/17; left heel remains closed. He has [name] new open area on the left buttock. The area on the left lateral calf is bigger longer and still covered in necrotic debris. No major change in his foot areas bilaterally. I am awaiting for [name] dermatologist to look on this. We have been using ketoconazole I do not know that this is been doing any good at all. 2/24; left heel remains closed. The left buttock wound that was new reopening last week looks better. The left lateral calf appears better also although still requires debridement. The major area on his foot is the left first second also requiring debridement. We have been putting Prisma on all wounds. I do not believe that the ketoconazole has done too much good for his feet. He will use Lotrisone I am going to give him [name] 2-week course of terbinafine. We still do not have [name] dermatology appointment 3/2 left heel remains closed however there is skin over bone in this area I pointed this out to him today. The left buttock wound is epithelialized but still does not look completely stable. The area on the left leg required debridement were using silver collagen here. With regards to his feet we changed to Lotrisone last week and silver alginate. 3/9; left heel remains closed. Left buttock remains closed. The area on the right foot is essentially closed. The left foot remains unchanged. Slightly smaller on the left lateral calf. Using  silver collagen to both of these areas 3/16-Left heel remains closed. Area on right foot is closed. Left lateral calf above the lateral malleolus open wound requiring debridement with easy bleeding. Left dorsal wound proximal to first toe also debrided. Left ischial area open new. Patient has been using Prisma with wrapping every 3 days. Dermatology appointment is apparently tomorrow.Patient has completed his terbinafine 2-week course with some apparent improvement according to him, there is still flaking and dry skin in his foot on the left 3/23; area on the right foot is reopened. The area on the left anterior foot is about the same still [name] very necrotic adherent surface. He still has the area on the left leg and reopening is on the left buttock. He apparently saw dermatology although I do not have [name] note. According to the patient who is usually fairly well informed they did not have any good ideas. Put him on oral terbinafine which she is been on before. 3/30; using silver collagen to all wounds. Apparently his dermatologist put him on doxycycline and rifampin presumably some culture grew staph. I do not have this result. He remains on terbinafine although I have used terbinafine on him before 4/6; patient has had  [name] fairly substantial reopening on the right foot between the first and second toes. He is finished his terbinafine and I believe is on doxycycline and rifampin still as prescribed by dermatology. We have been using silver collagen to all his wounds although the patient reports that he thinks silver alginate does better on the wounds on his buttock. 4/13; the area on his left lateral calf about the same size but it did not require debridement. Left dorsal foot just proximal to the webspace between the first and second toes is about the same. Still nonviable surface. I note some superficial bronze discoloration of the dorsal part of his foot Right dorsal foot just proximal to the first  and second toes also looks about the same. I still think there may be the same discoloration I noted above on the left Left buttock wound looks about the same 4/20; left lateral calf appears to be gradually contracting using silver collagen. 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 The left dorsal foot wound is debrided of tightly adherent necrotic debris and really cleans up quite nicely. The right area is worse with expansion. I did not debride this it is now over the base of the second toe The area on his left buttock is smaller no debridement is required using silver collagen 5/4; left calf continues to make good progress. He arrives with erythema around the wounds on his dorsal foot which even extends to the plantar aspect. Very concerning for coexistent infection. He is finished the erythromycin I gave him for possible erythrasma this does not seem to have helped. The area on the left foot is about the same base of the dorsal toes Is area on the buttock looks improved on the left 5/11; left calf and left buttock continued to make good progress. Left foot is about the same to slightly improved. Major problem is on the right foot. He has not had an x-ray. Deep tissue culture I did last week showed both Enterobacter and E. coli. I did not change the doxycycline I put him on empirically although neither 1 of these were plated to doxycycline. He arrives today with the erythema looking worse on both the dorsal and plantar foot. Macerated skin on the bottom of the foot. he has not been systemically unwell 5/18-Patient returns at 1 week, left calf wound appears to be making some progress, left buttock wound appears slightly worse than last time, left foot wound looks slightly better, right foot redness is marginally better. X-ray of both feet show no air or evidence of osteomyelitis. Patient is finished his Omnicef and terbinafine. He  continues to have macerated skin on the bottom of the left foot as well as right 5/26; left calf wound is better, left buttock wound appears to have multiple small superficial open areas with surrounding macerated skin. X-rays that I did last time showed no evidence of osteomyelitis in either foot. He is finished cefdinir and doxycycline. I do not think that he was on terbinafine. He continues to have [name] large superficial open area on the right foot anterior dorsal and slightly between the first and second toes. I did send him to dermatology 2 months ago or so wondering about whether they would do [name] fungal scraping. I do not believe they did but did do [name] culture. We have been using silver alginate to the toe areas, he has been using antifungals at home topically either ketoconazole or Lotrisone. We are using silver  collagen on the left foot, silver alginate on the right, silver collagen on the left lateral leg and silver alginate on the left buttock 6/1; left buttock area is healed. We have the left dorsal foot, left lateral leg and right dorsal foot. We are using silver alginate to the areas on both feet and silver collagen to the area on his left lateral calf 6/8; the left buttock apparently reopened late last week. He is not really sure how this happened. He is tolerating the terbinafine. Using silver alginate to all wounds 6/15; left buttock wound is larger than last week but still superficial. Came in the clinic today with [name] report of purulence from the left lateral leg I did not identify any infection Both areas on his dorsal feet appear to be better. He is tolerating the terbinafine. Using silver alginate to all wounds 6/22; left buttock is about the same this week, left calf quite [name] bit better. His left foot is about the same however he comes in with erythema and warmth in the right forefoot once again. Culture that I gave him in the beginning of May showed Enterobacter and E. coli. I gave him  doxycycline and things seem to improve although neither 1 of these organisms was specifically plated. 6/29; left buttock is larger and dry this week. Left lateral calf looks to me to be improved. Left dorsal foot also somewhat improved right foot completely unchanged. The erythema on the right foot is still present. He is completing the Ceftin dinner that I gave him empirically [see discussion above.) 7/6 - All wounds look to be stable and perhaps improved, the left buttock wound is slightly smaller, per patient bleeds easily, completed ceftin, the right foot redness is less, he is on terbinafine 7/13; left buttock wound about the same perhaps slightly narrower. Area on the left lateral leg continues to narrow. Left dorsal foot slightly smaller right foot about the same. We are using silver alginate on the right foot and Hydrofera Blue to the areas on the left. Unna boot on the left 2 layer compression on the right 7/20; left buttock wound absolutely the same. Area on lateral leg continues to get better. Left dorsal foot require debridement as did the right no major change in the 7/27; left buttock wound the same size necrotic debris over the surface. The area on the lateral leg is closed once again. His left foot looks better right foot about the same although there is some involvement now of the posterior first second toe area. He is still on terbinafine which I have given him for [name] month, not certain [name] centimeter major change 06/25/19-All wounds appear to be slightly improved according to report, left buttock wound looks clean, both foot wounds have minimal to no debris the right dorsal foot has minimal slough. We are using Hydrofera Blue to the left and silver alginate to the right foot and ischial wound. 8/10-Wounds all appear to be around the same, the right forefoot distal part has some redness which was not there before, however the wound looks clean and small. Ischial wound looks about the  same with no changes 8/17; his wound on the left lateral calf which was his original chronic venous insufficiency wound remains closed. Since I last saw him the areas on the left dorsal foot right dorsal foot generally appear better but require debridement. The area on his left initial tuberosity appears somewhat larger to me perhaps hyper granulated and bleeds very easily. We have been  using Hydrofera Blue to the left dorsal foot and silver alginate to everything else 8/24; left lateral calf remains closed. The areas on his dorsal feet on the webspace of the first and second toes bilaterally both look better. The area on the left buttock which is the pressure ulcer stage II slightly smaller. I change the dressing to Hydrofera Blue to all areas 8/31; left lateral calf remains closed. The area on his dorsal feet bilaterally look better. Using Hydrofera Blue. Still requiring debridement on the left foot. No change in the left buttock pressure ulcers however 9/14; left lateral calf remains closed. Dorsal feet look quite [name] bit better than 2 [name] ago. Flaking dry skin also [name] lot better with the ammonium lactate I gave him 2 [name] ago. The area on the left buttock is improved. He states that his Roho cushion developed [name] leak and he is getting [name] new one, in the interim he is offloading this vigorously 9/21; left calf remains closed. Left heel which was [name] possible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 ABI of him 0.88 on the right 1.17 on the left the. Waveforms were biphasic at the posterior tibial and dorsalis pedis digital waveforms were normal. Right toe brachial index was 1.3 limited by shaking and edema. His venous study showed widespread reflux in the left at the common femoral vein the greater and lesser saphenous vein the greater and lesser saphenous vein on the right as well as the popliteal and femoral vein. The popliteal and femoral vein on the left  did not show reflux. His wounds on the right leg give healed on the left he is still using Santyl. 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 The erythema has not really resolved and I wonder if this is mostly venous inflammation rather than cellulitis. Still using Santyl. He is approved for Apligraf 03/04/16; there is less erythema around the wound. Both wounds require aggressive surgical debridement. Not yet ready for Apligraf 03/11/16; aggressive debridement again. Not ready for Apligraf 03/18/16 aggressive debridement again. 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 Has been to see vascular surgery he is being planned for [name] venous ablation 03/25/16; aggressive debridement again of both wound areas on the left lateral leg. He is due for ablation surgery on May 22. He is much closer to being ready for an Apligraf. Has [name] new area between the left first and second toes 04/01/16 aggressive debridement done of both wounds. The new wound at the base of between his second and first toes looks stable 04/08/16; continued aggressive debridement of both wounds on the left lower leg. He goes for his venous ablation on Monday. The new wound at the base of his first and second toes dorsally appears stable. 04/15/16; wounds aggressively  debridement although the base of this looks considerably better Apligraf #1. He had ablation surgery on Monday I'll need to research these records. We only have approval for four Apligraf's 04/22/16; the patient is here for [name] wound check [Apligraf last week] intake nurse concerned about erythema around the wounds. Apparently [name] significant degree of drainage. The patient has chronic venous inflammation which I think accounts for most of this however I was asked to look at this today 04/26/16; the patient came back for check of possible cellulitis in his left foot however the Apligraf dressing was inadvertently removed therefore we elected to prep the wound for [name] second Apligraf. I put him on doxycycline on 6/1 the erythema in the foot 05/03/16 we did not remove the dressing from the superior wound as this is where I put all of his last Apligraf. Surface debridement done with [name] curette of the lower wound which looks very healthy. The area on the left foot also looks quite satisfactory at the dorsal artery at the first and second toes 05/10/16; continue Apligraf to this. Her wound, Hydrafera to the lower wound. He has [name] new area on the right second toe. Left dorsal foot firstoosecond toe also looks improved 05/24/16; wound dimensions must be smaller I was able to use Apligraf to all 3 remaining wound areas. 06/07/16 patient's last Apligraf was 2 [name] ago. He arrives today with the 2 wounds on his lateral left leg joined together. This would have to be seen as [name] negative. He also has [name] small wound in his first and second toe on the left dorsally with quite [name] bit of surrounding erythema in the first second and third toes. This looks to be infected or inflamed, very difficult clinical call. 06/21/16: lateral left leg combined wounds. Adherent surface slough area on the left dorsal foot at roughly the fourth toe looks improved 07/12/16; he now has [name] single linear wound on the lateral left leg. This does not look to  be [name] lot changed from when I lost saw this. The area on his dorsal left foot looks considerably better however. 08/02/16; no major change in the substantial area on his left lateral leg since last time. We have been using Hydrofera Blue  for [name] prolonged period of time now. The area on his left foot is also unchanged from last review 07/19/16; the area on his dorsal foot on the left looks considerably smaller. He is beginning to have significant rims of epithelialization on the lateral left leg wound. This also looks better. 08/05/16; the patient came in for [name] nurse visit today. Apparently the area on his left lateral leg looks better and it was wrapped. However in general discussion the patient noted [name] new area on the dorsal aspect of his right second toe. The exact etiology of this is unclear but likely relates to pressure. 08/09/16 really the area on the left lateral leg did not really look that healthy today perhaps slightly larger and measurements. The area on his dorsal right second toe is improved also the left foot wound looks stable to improved 08/16/16; the area on the last lateral leg did not change any of dimensions. Post debridement with [name] curet the area looked better. Left foot wound improved and the area on the dorsal right second toe is improved 08/23/16; the area on the left lateral leg may be slightly smaller both in terms of length and width. Aggressive debridement with [name] curette afterwards the tissue appears healthier. Left foot wound appears improved in the area on the dorsal right second toe is improved 08/30/16 patient developed [name] fever over the weekend and was seen in an urgent care. Felt to have [name] UTI and put on doxycycline. He has been since changed over the phone to [facility]. After we took off the wrap on his right leg today the leg is swollen warm and erythematous, probably more likely the source of the fever 09/06/16; have been using collagen to the major left leg wound, silver  alginate to the area on his anterior foot/toes 09/13/16; the areas on his anterior foot/toes on both sides appear to be virtually closed. Extensive wound on the left lateral leg perhaps slightly narrower but each visit still covered an adherent surface slough 09/16/16 patient was in for his usual Thursday nurse visit however the intake nurse noted significant erythema of his dorsal right foot. He is also running [name] low- grade fever and having increasing spasms in the right leg 09/20/16 here for cellulitis involving his right great toes and forefoot. This is [name] lot better. Still requiring debridement on his left lateral leg. Santyl direct says he needs prior authorization. Therefore his wife cannot change this at home 09/30/16; the patient's extensive area on the left lateral calf and ankle perhaps somewhat better. Using Santyl. The area on the left toes is healed and I think the area on his right dorsal foot is healed as well. There is no cellulitis or venous inflammation involving the right leg. He is going to need compression stockings here. 10/07/16; the patient's extensive wound on the left lateral calf and ankle does not measure any differently however there appears to be less adherent surface slough using Santyl and aggressive weekly debridements 10/21/16; no major change in the area on the left lateral calf. Still the same measurement still very difficult to debridement adherent slough and nonviable subcutaneous tissue. This is not really been helped by several [name] of Santyl. Previously for 2 [name] I used Iodoflex for [name] short period. [name] prolonged course of Hydrofera Blue didn't really help. I'm not sure why I only used 2 [name] of Iodoflex on this there is no evidence of surrounding infection. He has [name] small area on the right second toe which  looks as though it's progressing towards closure 10/28/16; the wounds on his toes appear to be closed. No major change in the left lateral leg wound although  the surface looks somewhat better using Iodoflex. He has had previous arterial studies that were normal. He has had reflux studies and is status post ablation although I don't have any exact notes on which vein was ablated. I'll need to check the surgical record 11/04/16; he's had [name] reopening between the first and second toe on the left and right. No major change in the left lateral leg wound. There is what appears to be cellulitis of the left dorsal foot 11/18/16 the patient was hospitalized initially in [place] and then subsequently transferred to [facility] long and was admitted there from 11/09/16 through 11/12/16. He had developed progressive cellulitis on the right leg in spite of the doxycycline I gave him. I'd spoken to the hospitalist in [place] who was concerned about continuing leukocytosis. CT scan is what I suggested this was done which showed soft tissue swelling without evidence of osteomyelitis or an underlying abscess blood cultures were negative. At [facility] he was treated with vancomycin and Primaxin and then add an infectious disease consult. He was transitioned to Ceftaroline. He has been making progressive improvement. Overall [name] severe cellulitis of the right leg. He is been using silver alginate to her original wound on the left leg. The wounds in his toes on the right are closed there is [name] small open area on the base of the left second toe 11/26/15; the patient's right leg is much better although there is still some edema here this could be reminiscent from his severe cellulitis likely on top of some degree of lymphedema. His left anterior leg wound has less surface slough as reported by her intake nurse. Small wound at the base of the left second toe 12/02/16; patient's right leg is better and there is no open wound here. His left anterior lateral leg wound continues to have [name] healthy-looking surface. Small wound at the base of the left second toe however there is erythema in the  left forefoot which is worrisome 12/16/16; is no open wounds on his right leg. We took measurements for stockings. His left anterior lateral leg wound continues to have [name] healthy-looking surface. I'm not sure where we were with the Apligraf run through his insurance. We have been using Iodoflex. He has [name] thick eschar on the left first second toe interface, I suspect this may be fungal however there is no visible open 12/23/16; no open wound on his right leg. He has 2 small areas left of the linear wound that was remaining last week. We have been using Prisma, I thought I have disclosed this week, we can only look forward to next week 01/03/17; the patient had concerning areas of erythema last week, already on doxycycline for UTI through his primary doctor. The erythema is absolutely no better there is warmth and swelling both medially from the left lateral leg wound and also the dorsal left foot. 01/06/17- Patient is here for follow-up evaluation of his left lateral leg ulcer and bilateral feet ulcers. He is on oral antibiotic therapy, tolerating that. Nursing staff and the patient states that the erythema is improved from Monday. 01/13/17; the predominant left lateral leg wound continues to be problematic. I had put Apligraf on him earlier this month once. However he subsequently developed what appeared to be an intense cellulitis around the left lateral leg wound. I gave him  Dalvance I think on 2/12 perhaps 2/13 he continues on cefdinir. The erythema is still present but the warmth and swelling is improved. I am hopeful that the cellulitis part of this control. I wouldn't be surprised if there is an element of venous inflammation as well. 01/17/17. The erythema is present but better in the left leg. His left lateral leg wound still does not have [name] viable surface buttons certain parts of this long thin wound it appears like there has been improvement in dimensions. 01/20/17; the erythema still present but  much better in the left leg. I'm thinking this is his usual degree of chronic venous inflammation. The wound on the left leg looks somewhat better. Is less surface slough 01/27/17; erythema is back to the chronic venous inflammation. The wound on the left leg is somewhat better. I am back to the point where I like to try an Apligraf once again 02/10/17; slight improvement in wound dimensions. Apligraf #2. He is completing his doxycycline 02/14/17; patient arrives today having completed doxycycline last Thursday. This was supposed to be [name] nurse visit however once again he hasn't tense erythema from the medial part of his wound extending over the lower leg. Also erythema in his foot this is roughly in the same distribution as last time. He has baseline chronic venous inflammation however this is [name] lot worse than the baseline I have learned to accept the on him is baseline inflammation 02/24/17- patient is here for follow-up evaluation. He is tolerating compression therapy. His voicing no complaints or concerns he is here anticipating an Apligraf 03/03/17; he arrives today with an adherent necrotic surface. I don't think this is surface is going to be amenable for Apligraf's. The erythema around his wound and on the left dorsal foot has resolved he is off antibiotics 03/10/17; better-looking surface today. I don't think he can tolerate Apligraf's. He tells me he had [name] wound VAC after [name] skin graft years ago to this area and they had difficulty with [name] seal. The erythema continues to be stable around this some degree of chronic venous inflammation but he also has recurrent cellulitis. We have been using Iodoflex 03/17/17; continued improvement in the surface and may be small changes in dimensions. Using Iodoflex which seems the only thing that will control his surface 03/24/17- He is here for follow up evaluation of his LLE lateral ulceration and ulcer to right dorsal foot/toe space. He is voicing no complaints or  concerns, He is tolerating compression wrap. 03/31/17 arrives today with [name] much healthier looking wound on the left lower extremity. We have been using Iodoflex for [name] prolonged period of time which has for the first time prepared and adequate looking wound bed although we have not had much in the way of wound dimension improvement. He also has [name] small wound between the first and second toe on the right 04/07/17; arrives today with [name] healthy-looking wound bed and at least the top 50% of this wound appears to be now her. No debridement was required I have changed him to [facility] last week after prolonged Iodoflex. He did not do well with Apligraf's. We've had [name] re-opening between the first and second toe on the right 04/14/17; arrives today with [name] healthier looking wound bed contractions and the top 50% of this wound and some on the lesser 50%. Wound bed appears healthy. The area between the first and second toe on the right still remains problematic 04/21/17; continued very gradual improvement. Using [facility]  04/28/17; continued very gradual improvement in the left lateral leg venous insufficiency wound. His periwound erythema is very mild. We have been using Hydrofera Blue. Wound is making progress especially in the superior 50% 05/05/17; he continues to have very gradual improvement in the left lateral venous insufficiency wound. Both in terms with an length rings are improving. I debrided this every 2 [name] with #5 curet and we have been using Hydrofera Blue and again making good progress With regards to the wounds between his right first and second toe which I thought might of been tinea pedis he is not making as much progress very dry scaly skin over the area. Also the area at the base of the left first and second toe in [name] similar condition 05/12/17; continued gradual improvement in the refractory left lateral venous insufficiency wound on the left. Dimension smaller. Surface still  requiring debridement using Hydrofera Blue 05/19/17; continued gradual improvement in the refractory left lateral venous ulceration. Careful inspection of the wound bed underlying rumination suggested some degree of epithelialization over the surface no debridement indicated. Continue Hydrofera Blue difficult areas between his toes first and third on the left than first and second on the right. I'm going to change to silver alginate from silver collagen. Continue ketoconazole as I suspect underlying tinea pedis 05/26/17; left lateral leg venous insufficiency wound. We've been using Hydrofera Blue. I believe that there is expanding epithelialization over the surface of the wound albeit not coming from the wound circumference. This is [name] bit of an odd situation in which the epithelialization seems to be coming from the surface of the wound rather than in the exact circumference. There is still small open areas mostly along the lateral margin of the wound. ooHe has unchanged areas between the left first and second and the right first second toes which I been treating for tenia pedis 06/02/17; left lateral leg venous insufficiency wound. We have been using Hydrofera Blue. Somewhat smaller from the wound circumference. The surface of the wound remains [name] bit on it almost epithelialized sedation in appearance. I use an open curette today debridement in the surface of all of this especially the edges ooSmall open wounds remaining on the dorsal right first and second toe interspace and the plantar left first second toe and her face on the left 06/09/17; wound on the left lateral leg continues to be smaller but very gradual and very dry surface using Hydrofera Blue 06/16/17 requires weekly debridements now on the left lateral leg although this continues to contract. I changed to silver collagen last week because of dryness of the wound bed. Using Iodoflex to the areas on his first and second toes/web space  bilaterally 06/24/17; patient with history of paraplegia also chronic venous insufficiency with lymphedema. Has [name] very difficult wound on the left lateral leg. This has been gradually reducing in terms of with but comes in with [name] very dry adherent surface. High switch to silver collagen [name] week or so ago with hydrogel to keep the area moist. This is been refractory to multiple dressing attempts. He also has areas in his first and second toes bilaterally in the anterior and posterior web space. I had been using Iodoflex here after [name] prolonged course of silver alginate with ketoconazole was ineffective [question tinea pedis] 07/14/17; patient arrives today with [name] very difficult adherent material over his left lateral lower leg wound. He also has surrounding erythema and poorly controlled edema. He was switched his Santyl last visit which the  nurses are applying once during his doctor visit and once on [name] nurse visit. He was also reduced to 2 layer compression I'm not exactly sure of the issue here. 07/21/17; better surface today after 1 week of Iodoflex. Significant cellulitis that we treated last week also better. [Doxycycline] 07/28/17 better surface today with now 2 [name] of Iodoflex. Significant cellulitis treated with doxycycline. He has now completed the doxycycline and he is back to his usual degree of chronic venous inflammation/stasis dermatitis. He reminds me he has had ablations surgery here 08/04/17; continued improvement with Iodoflex to the left lateral leg wound in terms of the surface of the wound although the dimensions are better. 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 Problematic areas on the plantar aspect of the first second toe web space on the left and the dorsal aspect of the first second toe web space on the right. At one point I felt these were probably related to chronic fungal infections in treated him  aggressively for this although we have not made any improvement here. 08/11/17; left lateral leg. Surface continues to improve with the Iodoflex although we are not seeing much improvement in overall wound dimensions. Areas on his plantar left foot and right foot show no improvement. In fact the right foot looks somewhat worse 08/18/17; left lateral leg. We changed to [facility] Blue last week after [name] prolonged course of Iodoflex which helps get the surface better. It appears that the wound with is improved. Continue with difficult areas on the left dorsal first second and plantar first second on the right 09/01/17; patient arrives in clinic today having had [name] temperature of 103 yesterday. He was seen in the ER and [facility]. The patient was concerned he could have cellulitis again in the right leg however they diagnosed him with [name] UTI and he is now on Keflex. He has [name] history of cellulitis which is been recurrent and difficult but this is been in the left leg, in the past 5 use doxycycline. He does in and out catheterizations at home which are risk factors for UTI 09/08/17; patient will be completing his Keflex this weekend. The erythema on the left leg is considerably better. He has [name] new wound today on the medial part of the right leg small superficial almost looks like [name] skin tear. He has worsening of the area on the right dorsal first and second toe. His major area on the left lateral leg is better. Using Hydrofera Blue on all areas 09/15/17; gradual reduction in width on the long wound in the left lateral leg. No debridement required. He also has wounds on the plantar aspect of his left first second toe web space and on the dorsal aspect of the right first second toe web space. 09/22/17; there continues to be very gradual improvements in the dimensions of the left lateral leg wound. He hasn't round erythematous spot with might be pressure on his wheelchair. There is no evidence obviously of infection  no purulence no warmth ooHe has [name] dry scaled area on the plantar aspect of the left first second toe ooImproved area on the dorsal right first second toe. 09/29/17; left lateral leg wound continues to improve in dimensions mostly with an is still [name] fairly long but increasingly narrow wound. ooHe has [name] dry scaled area on the plantar aspect of his left first second toe web space ooIncreasingly concerning area on the dorsal right first second toe.  In fact I am concerned today about possible cellulitis around this wound. The areas extending up his second toe and although there is deformities here almost appears to abut on the nailbed. 10/06/17; left lateral leg wound continues to make very gradual progress. Tissue culture I did from the right first second toe dorsal foot last time grew MRSA and enterococcus which was vancomycin sensitive. This was not sensitive to clindamycin or doxycycline. He is allergic to Zyvox and sulfa we have therefore arrange for him to have dalvance infusion tomorrow. He is had this in the past and tolerated it well 10/20/17; left lateral leg wound continues to make decent progress. This is certainly reduced in terms of with there is advancing epithelialization.ooThe cellulitis in the right foot looks better although he still has [name] deep wound in the dorsal aspect of the first second toe web space. Plantar left first toe web space on the left I think is making some progress 10/27/17; left lateral leg wound continues to make decent progress. Advancing epithelialization.using Hydrofera Blue ooThe right first second toe web space wound is better-looking using silver alginate ooImprovement in the left plantar first second toe web space. Again using silver alginate 11/03/17 left lateral leg wound continues to make decent progress albeit slowly. Using Hydrofera Blue ooThe right per second toe web space continues to be [name] very problematic looking punched out wound. I obtained [name]  piece of tissue for deep culture I did extensively treated this for fungus. It is difficult to imagine that this is [name] pressure area as the patient states other than going outside he doesn't really wear shoes at home ooThe left plantar first second toe web space looked fairly senescent. Necrotic edges. This required debridement oochange to Hydrofera Blue to all wound areas 11/10/17; left lateral leg wound continues to contract. Using Hydrofera Blue ooOn the right dorsal first second toe web space dorsally. Culture I did of this area last week grew MRSA there is not an easy oral option in this patient was multiple antibiotic allergies or intolerances. This was only [name] rare culture isolate I'm therefore going to use Bactroban under silver alginate ooOn the left plantar first second toe web space. Debridement is required here. This is also unchanged 11/17/17; left lateral leg wound continues to contract using Hydrofera Blue this is no longer the major issue. ooThe major concern here is the right first second toe web space. He now has an open area going from dorsally to the plantar aspect. There is now wound on the inner lateral part of the first toe. Not [name] very viable surface on this. There is erythema spreading medially into the forefoot. ooNo major change in the left first second toe plantar wound 11/24/17; left lateral leg wound continues to contract using Hydrofera Blue. Nice improvement today ooThe right first second toe web space all of this looks [name] lot less angry than last week. I have given him clindamycin and topical Bactroban for MRSA and terbinafine for the possibility of underlining tinea pedis that I could not control with ketoconazole. Looks somewhat better ooThe area on the plantar left first second toe web space is weeping with dried debris around the wound 12/01/17; left lateral leg wound continues to contract he Hydrofera Blue. It is becoming thinner in terms of with nevertheless  it is making good improvement. ooThe right first second toe web space looks less angry but still [name] large necrotic-looking wounds starting on the plantar aspect of the right foot extending between the  toes and now extensively on the base of the right second toe. I gave him clindamycin and topical Bactroban for MRSA anterior benefiting for the possibility of underlying tinea pedis. Not looking better today ooThe area on the left first/second toe looks better. Debrided of necrotic debris 12/05/17* the patient was worked in urgently today because over the weekend he found blood on his incontinence bad when he woke up. He was found to have an ulcer by his wife who does most of his wound care. He came in today for [place] to look at this. He has not had [name] history of wounds in his buttocks in spite of his paraplegia. 12/08/17; seen in follow-up today at his usual appointment. He was seen earlier this week and found to have [name] new wound on his buttock. We also follow him for wounds on the left lateral leg, left first second toe web space and right first second toe web space 12/15/17; we have been using Hydrofera Blue to the left lateral leg which has improved. The right first second toe web space has also improved. Left first second toe web space plantar aspect looks stable. The left buttock has worsened using Santyl. Apparently the buttock has drainage 12/22/17; we have been using Hydrofera Blue to the left lateral leg which continues to improve now 2 small wounds separated by normal skin. He tells [place] he had [name] fever up to 100 yesterday he is prone to UTIs but has not noted anything different. He does in and out catheterizations. The area between the first and second toes today does not look good necrotic surface covered with what looks to be purulent drainage and erythema extending into the third toe. I had gotten this to something that I thought look better last time however it is not look good today. He also has  [name] necrotic surface over the buttock wound which is expanded. I thought there might be infection under here so I removed [name] lot of the surface with [name] #5 curet though nothing look like it really needed culturing. He is been using Santyl to this area 12/27/17; his original wound on the left lateral leg continues to improve using Hydrofera Blue. I gave him samples of Baxdella although he was unable to take them out of fear for an allergic reaction ["lump in his throat"].the culture I did of the purulent drainage from his second toe last week showed both enterococcus and [name] set Enterobacter I was also concerned about the erythema on the bottom of his foot although paradoxically although this looks somewhat better today. 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 We've been using silver alginate here. They came up today that he sleeps in [name] recliner, I'm not sure why but I asked him to stop this 01/03/18; his original wound we've been using Hydrofera Blue is now separated into 2 areas. ooUlcer on his left buttock is better he is off the recliner and sleeping in bed ooFinally both wound areas between his first and second toes also looks some better 01/10/18; his original wound on the left lateral leg is now separated into 2 wounds we've been using Hydrofera Blue ooUlcer on his left buttock has some drainage. There is [name] small probing site going into muscle layer superiorly.using silver alginate -He arrives today with [name] deep tissue injury on the left heel ooThe wound on the dorsal aspect of his first second toe on  the left looks [name] lot betterusing silver alginate ketoconazole ooThe area on the first second toe web space on the right also looks [name] lot bette 01/17/18; his original wound on the left lateral leg continues to progress using Hydrofera Blue ooUlcer on his left buttock also is smaller surface healthier except  for [name] small probing site going into the muscle layer superiorly. 2.4 cm of tunneling in this area ooDTI on his left heel we have only been offloading. Looks better than last week no threatened open no evidence of infection oothe wound on the dorsal aspect of the first second toe on the left continues to look like it's regressing we have only been using silver alginate and terbinafine orally ooThe area in the first second toe web space on the right also looks to be [name] lot better using silver alginate and terbinafine I think this was prompted by tinea pedis 01/31/18; the patient was hospitalized in [place] last week apparently for [name] complicated UTI. He was discharged on cefepime he does in and out catheterizations. In the hospital he was discovered M I don't mild elevation of AST and ALT and the terbinafine was stopped.predictably the pressure ulcer on s his buttock looks betterusing silver alginate. The area on the left lateral leg also is better using Hydrofera Blue. The area between the first and second toes on the left better. First and second toes on the right still substantial but better. Finally the DTI on the left heel has held together and looks like it's resolving 02/07/18-he is here in follow-up evaluation for multiple ulcerations. He has new injury to the lateral aspect of the last issue [name] pressure ulcer, he states this is from adhesive removal trauma. He states he has tried multiple adhesive products with no success. All other ulcers appear stable. The left heel DTI is resolving. We will continue with same treatment plan and follow-up next week. 02/14/18; follow-up for multiple areas. ooHe has [name] new area last week on the lateral aspect of his pressure ulcer more over the posterior trochanter. The original pressure ulcer looks quite stable has healthy granulation. 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 Almost fully epithelialized on the original superior area using Hydrofera Blue ooDTI on the left heel has peeled off this week to reveal [name] small superficial wound under denuded skin and subcutaneous tissue ooBoth areas between the first and second toes look better including nothing open on the left 02/21/18; ooThe patient's wounds on his left ischial tuberosity and posterior left greater trochanter actually looked better. He has [name] large area of irritation around the area which I think is contact dermatitis. I am doubtful that this is fungal ooHis original wound on the left lateral calf continues to improve we have been using Hydrofera Blue ooThere is no open area in the left first second toe web space although there is [name] lot of thick callus ooThe DTI on the left heel required debridement today of necrotic surface eschar and subcutaneous tissue using silver alginate ooFinally the area on the right first second toe webspace continues to contract using silver alginate and ketoconazole 02/28/18 ooLeft ischial tuberosity wounds look better using silver alginate. ooOriginal wound on the left calf only has one small open area left using Hydrofera Blue ooDTI on the left heel required debridement mostly removing skin from around this wound surface. Using silver alginate ooThe areas on the right first/second  toe web space using silver alginate and ketoconazole 03/08/18 on evaluation today patient appears to be doing decently well as best I can tell in regard to his wounds. This is the first time that I have seen him as he generally is followed by [name] With that being said none of his wounds appear to be infected he does have an area where there is some skin covering what appears to be [name] new wound on the left dorsal surface of his great toe. This is right at the nail bed. With that being said I do believe that debrided away some of the excess skin can be of benefit in  this regard. Otherwise he has been tolerating the dressing changes without complication. 03/14/18; patient arrives today with the multiplicity of wounds that we are following. He has not been systemically unwell ooOriginal wound on the left lateral calf now only has 2 small open areas we've been using Hydrofera Blue which should continue ooThe deep tissue injury on the left heel requires debridement today. We've been using silver alginate ooThe left first second toe and the right first second toe are both are reminiscence what I think was tinea pedis. Apparently some of the callus Surface between the toes was removed last week when it started draining. ooPurulent drainage coming from the wound on the ischial tuberosity on the left. 03/21/18-He is here in follow-up evaluation for multiple wounds. 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 He tolerated debridement. The only change to last week's recommendations is to discontinue antifungal cream between toes. He will follow-up next week 03/28/18; following up for multiple wounds;Concern this week is streaking redness and swelling in the right foot. He is going to need antibiotics for this. 03/31/18; follow-up for right foot cellulitis. Streaking redness and swelling in the right foot on 03/28/18. 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 I put him on clindamycin 300 mg every 6 and brought him in for [name] quick check. He has an open wound between his first and second toes on the right foot as [name] potential source. 04/04/18; ooRight foot cellulitis is resolving he is completing clindamycin. This is truly good news ooLeft lateral calf wound which is initial wound only has one small open area inferiorly this is close to healing out. He has compression stockings. We will use Hydrofera Blue right down to the epithelialization of this ooNonviable surface on the left  heel which was initially pressure with [name] DTI. We've been using Hydrofera Blue. I'm going to switch this back to silver alginate ooLeft first second toe/tinea pedis this looks better using silver alginate ooRight first second toe tinea pedis using silver alginate ooLarge pressure ulcers on theLeft ischial tuberosity. Small wound here Looks better. I am uncertain about the surface over the large wound. Using silver alginate 04/11/18; ooCellulitis in the right foot is resolved ooLeft lateral calf wound which was his original wounds still has 2 tiny open areas remaining this is just about closed ooNonviable surface on the left heel is better but still requires debridement ooLeft first second toe/tinea pedis still open using silver alginate ooRight first second toe wound tinea pedis I asked him to go back to using ketoconazole and silver alginate ooLarge pressure ulcers on the left ischial tuberosity this shear injury here is resolved. Wound is smaller. No evidence of infection using silver alginate 04/18/18; ooPatient arrives with an intense area of cellulitis in the right mid  lower calf extending into the right heel area. Bright red and warm. Smaller area on the left anterior leg. He has [name] significant history of MRSA. He will definitely need antibioticsoodoxycycline ooHe now has 2 open areas on the left ischial tuberosity the original large wound and now [name] satellite area which I think was above his initial satellite areas. Not [name] wonderful surface on this satellite area surrounding erythema which looks like pressure related. ooHis left lateral calf wound again his original wound is just about closed ooLeft heel pressure injury still requiring debridement ooLeft first second toe looks [name] lot better using silver alginate ooRight first second toe also using silver alginate and ketoconazole cream also looks better 04/20/18; the patient was worked in early today out of concerns with his  cellulitis on the right leg. I had started him on doxycycline. This was 2 days ago. His wife was concerned about the swelling in the area. Also concerned about the left buttock. He has not been systemically unwell no fever chills. No nausea vomiting or diarrhea 04/25/18; the patient's left buttock wound is continued to deteriorate he is using Hydrofera Blue. He is still completing clindamycin for the cellulitis on the right leg although all of this looks better. 05/02/18 ooLeft buttock wound still with [name] lot of drainage and [name] very tightly adherent fibrinous necrotic surface. He has [name] deeper area superiorly ooThe left lateral calf wound is still closed ooDTI wound on the left heel necrotic surface especially the circumference using Iodoflex ooAreas between his left first second toe and right first second toe both look better. Dorsally and the right first second toe he had [name] necrotic surface although at smaller. In using silver alginate and ketoconazole. I did [name] culture last week which was [name] deep tissue culture of the reminiscence of the open wound on the right first second toe dorsally. This grew [name] few Acinetobacter and [name] few methicillin-resistant staph aureus. Nevertheless the area actually this week looked better. I didn't feel the need to specifically address this at least in terms of systemic antibiotics. 05/09/18; wounds are measuring larger more drainage per our intake. We are using Santyl covered with alginate on the large superficial buttock wounds, Iodosorb on the left heel, ketoconazole and silver alginate to the dorsal first and second toes bilaterally. 05/16/18; ooThe area on his left buttock better in some aspects although the area superiorly over the ischial tuberosity required an extensive debridement.using Santyl ooLeft heel appears stable. Using Iodoflex ooThe areas between his first and second toes are not bad however there is spreading erythema up the dorsal aspect of his left  foot this looks like cellulitis again. He is insensate the erythema is really very brilliant.o Erysipelas He went to see an allergist days ago because he was itching part of this he had lab work done. This showed [name] white count of 15.1 with 70% neutrophils. Hemoglobin of 11.4 and [name] platelet count of 659,000. Last white count we had in Epic was [name] 2-1/2 years ago which was 25.9 but he was ill at the time. He was able to show me some lab work that was done by his primary physician the pattern is about the same. 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 But prompted me to go ahead and do x-rays of both feet and the pelvis rule out osteomyelitis. He also had [name] comprehensive metabolic panel this was reasonably normal his albumin was 3.7 liver function tests  BUN/creatinine all normal 05/23/18; x-rays of both his feet from last week were negative for underlying pulmonary abnormality. The x-ray of his pelvis however showed mild irregularity in the left ischial which may represent some early osteomyelitis. The wound in the left ischial continues to get deeper clearly now exposed muscle. Each week necrotic surface material over this area. Whereas the rest of the wounds do not look so bad. ooThe left ischial wound we have been using Santyl and calcium alginate ooT the left heel surface necrotic debris using Iodoflex o ooThe left lateral leg is still healed ooAreas on the left dorsal foot and the right dorsal foot are about the same. There is some inflammation on the left which might represent contact dermatitis, fungal dermatitis I am doubtful cellulitis although this looks better than last week 05/30/18; CT scan done at Hospital did not show any osteomyelitis or abscess. Suggested the possibility of underlying cellulitis although I don't see [name] lot of evidence of this at the bedside ooThe wound itself on the left buttock/upper thigh actually looks somewhat better.  No debridement ooLeft heel also looks better no debridement continue Iodoflex ooBoth dorsal first second toe spaces appear better using Lotrisone. Left still required debridement 06/06/18; ooIntake reported some purulent looking drainage from the left gluteal wound. Using Santyl and calcium alginate ooLeft heel looks better although still [name] nonviable surface requiring debridement ooThe left dorsal foot first/second webspace actually expanding and somewhat deeper. I may consider doing [name] shave biopsy of this area ooRight dorsal foot first/second webspace appears stable to improved. Using Lotrisone and silver alginate to both these areas 06/13/18 ooLeft gluteal surface looks better. Now separated in the 2 wounds. No debridement required. Still drainage. We'll continue silver alginate ooLeft heel continues to look better with Iodoflex continue this for at least another week ooOf his dorsal foot wounds the area on the left still has some depth although it looks better than last week. We've been using Lotrisone and silver alginate 06/20/18 ooLeft gluteal continues to look better healthy tissue ooLeft heel continues to look better healthy granulation wound is smaller. He is using Iodoflex and his long as this continues continue the Iodoflex ooDorsal right foot looks better unfortunately dorsal left foot does not. There is swelling and erythema of his forefoot. He had minor trauma to this several days ago but doesn't think this was enough to have caused any tissue injury. Foot looks like cellulitis, we have had this problem before 06/27/18 on evaluation today patient appears to be doing [name] little worse in regard to his foot ulcer. 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 Both of which would really be his only options for treating this infection. In the past  he has been given and effusion of Orbactiv. This is done very well for him in the past again it's one time dosing IV antibiotic therapy. Subsequently I do believe this is something we're gonna need to see about doing at this point in time. Currently his other wounds seem to be doing somewhat better in my pinion I'm pretty happy in that regard. 07/03/18 on evaluation today patient's wounds actually appear to be doing fairly well. He has been tolerating the dressing changes without complication. All in all he seems to be showing signs of improvement. In regard to the antibiotics he has been dealing with infectious disease since I  saw him last week as far as getting this scheduled. In the end [facility]'s [facility] this coming Friday. In the meantime he has been continuing to perform the dressing changes in such as previous. There does not appear to be any evidence of infection worsengin at this time. 07/10/18; ooSince I last saw this man 2 [name] ago things have actually improved. IV antibiotics of resulted in less forefoot erythema although there is still some present. He is not systemically unwell ooLeft buttock wounds o2 now have no depth there is increased epithelialization Using silver alginate ooLeft heel still requires debridement using Iodoflex ooLeft dorsal foot still with [name] sizable wound about the size of [name] border but healthy granulation ooRight dorsal foot still with [name] slitlike area using silver alginate 07/18/18; the patient's cellulitis in the left foot is improved in fact I think it is on its way to resolving. ooLeft buttock wounds o2 both look better although the larger one has hypertension granulation we've been using silver alginate ooLeft heel has some thick circumferential redundant skin over the wound edge which will need to be removed today we've been using Iodoflex ooLeft dorsal foot is still [name] sizable wound required  debridement using silver alginate ooThe right dorsal foot is just about closed only [name] small open area remains here 07/25/18; left foot cellulitis is resolved ooLeft buttock wounds o2 both look better. Hyper-granulation on the major area ooLeft heel as some debris over the surface but otherwise looks [name] healthier wound. Using silver collagen ooRight dorsal foot is just about closed 07/31/18; arrives with our intake nurse worried about purulent drainage from the buttock. We had hyper-granulation here last week ooHis buttock wounds o2 continue to look better ooLeft heel some debris over the surface but measuring smaller. ooRight dorsal foot unfortunately has openings between the toes ooLeft foot superficial wound looks less aggravated. 08/07/18 ooButtock wounds continue to look better although some of her granulation and the larger medial wound. silver alginate ooLeft heel continues to look [name] lot better.silver collagen ooLeft foot superficial wound looks less stable. Requires debridement. He has [name] new wound superficial area on the foot on the lateral dorsal foot. ooRight foot looks better using silver alginate without Lotrisone 08/14/2018; patient was in the ER last week diagnosed with [name] UTI. He is now on Cefpodoxime and Macrodantin. ooButtock wounds continued to be smaller. Using silver alginate ooLeft heel continues to look better using silver collagen ooLeft foot superficial wound looks as though it is improving ooRight dorsal foot area is just about healed. 08/21/2018; patient is completed his antibiotics for his UTI. ooHe has 2 open areas on the buttocks. There is still not closed although the surface looks satisfactory. Using silver alginate ooLeft heel continues to improve using silver collagen ooThe bilateral dorsal foot areas which are at the base of his first and second toes/possible tinea pedis are actually stable on the left but worse on the right. The area on the left  required debridement of necrotic surface. After debridement I obtained [name] specimen for PCR culture. ooThe right dorsal foot which is been just about healed last week is now reopened 08/28/2018; culture done on the left dorsal foot showed coag negative staph both staph epidermidis and Lugdunensis. I think this is worthwhile initiating systemic treatment. I will use doxycycline given his long list of allergies. The area on the left heel slightly improved but still requiring debridement. ooThe large wound on  the buttock is just about closed whereas the smaller one is larger. Using silver alginate in this area 09/04/2018; patient is completing his doxycycline for the left foot although this continues to be [name] very difficult wound area with very adherent necrotic debris. We are using silver alginate to all his wounds right foot left foot and the small wounds on his buttock, silver collagen on the left heel. 09/11/2018; once again this patient has intense erythema and swelling of the left forefoot. Lesser degrees of erythema in the right foot. He has [name] long list of allergies and intolerances. I will reinstitute doxycycline. 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 Using silver alginate ooLeft heel also looks better using silver collagen ooUnfortunately both the areas on his feet look worse. The area on the left first second webspace is now gone through to the plantar part of his foot. The area on the left foot anteriorly is irritated with erythema and swelling in the forefoot. 09/25/2018 ooHis wound on the left plantar heel looks better. Using silver collagen ooThe area on the left buttock 2 small remnant areas. One is closed one is still open. Using silver alginate ooThe areas between both his first and second toes look worse. This in spite of long-standing antifungal therapy with ketoconazole and silver alginate which should have antifungal  activity ooHe has small areas around his original wound on the left calf one is on the bottom of the original scar tissue and one superiorly both of these are small and superficial but again given wound history in this site this is worrisome 10/02/2018 ooLeft plantar heel continues to gradually contract using silver collagen ooLeft buttock wound is unchanged using silver alginate ooThe areas on his dorsal feet between his first and second toes bilaterally look about the same. 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 Small open area here. We have been using silver alginate 10/09/2018; ooLeft plantar heel continues to look healthy and contract. No debridement is required ooLeft buttock slightly smaller with [name] tape injury wound just below which was new this week ooDorsal feet somewhat improved I have been using clindamycin ooLeft lateral looks lower extremity the actual open area looks worse although [name] lot of this is epithelialized. I am going to change to silver collagen today He has [name] lot more swelling in the right leg although this is not pitting not red and not particularly warm there is [name] lot of spasm in the right leg usually indicative of people with paralysis of some underlying discomfort. We have reviewed his vascular status from 2017 he had [name] left greater saphenous vein ablation. I wonder about referring him back to vascular surgery if the area on the left leg continues to deteriorate. 10/16/2018 in today for follow-up and management of multiple lower extremity ulcers. His left Buttock wound is much lower smaller and almost closed completely. The wound to the left ankle has began to reopen with Epithelialization and some adherent slough. He has multiple new areas to the left foot and leg. The left  dorsal foot without much improvement. Wound present between left great webspace and 2nd toe. Erythema and edema present right leg. Right LE ultrasound obtained on 10/10/18 was negative for DVT . 10/23/2018; ooLeft buttock is closed over. Still dry macerated skin  but there is no open wound. I suspect this is chronic pressure/moisture ooLeft lateral calf is quite [name] bit worse than when I saw this last. There is clearly drainage here he has macerated skin into the left plantar heel. We will change the primary dressing to alginate ooLeft dorsal foot has some improvement in overall wound area. Still using clindamycin and silver alginate ooRight dorsal foot about the same as the left using clindamycin and silver alginate ooThe erythema in the right leg has resolved. He is DVT rule out was negative ooLeft heel pressure area required debridement although the wound is smaller and the surface is health 10/26/2018 ooThe patient came back in for his nurse check today predominantly because of the drainage coming out of the left lateral leg with [name] recent reopening of his original wound on the left lateral calf. He comes in today with [name] large amount of surrounding erythema around the wound extending from the calf into the ankle and even in the area on the dorsal foot. He is not systemically unwell. He is not febrile. Nevertheless this looks like cellulitis. We have been using silver alginate to the area. I changed him to [name] regular visit and I am going to prescribe him doxycycline. The rationale here is [name] long list of medication intolerances and [name] history of MRSA. I did not see anything that I thought would provide [name] valuable culture 10/30/2018 ooFollow-up from his appointment 4 days ago with really an extensive area of cellulitis in the left calf left lateral ankle and left dorsal foot. I put him on doxycycline. He has [name] long list of medication allergies which are true allergy reactions. 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 In any case he is [name] lot better today. The erythema especially in the anterior and lateral left calf is better. He still has left ankle erythema. He also is complaining about increasing edema in the right leg we have only been using Kerlix Coban and he has been doing the wraps at home. Finally he has [name] spotty rash on the medial part of his upper left calf which looks like folliculitis or perhaps wrap occlusion type injury. Small superficial macules not pustules 11/06/18 patient arrives today with again [name] considerable degree of erythema around the wound on the left lateral calf extending into the dorsal ankle and dorsal foot. This is [name] lot worse than when I saw this last week. He is on doxycycline really with not [name] lot of improvement. He has not been systemically unwell Wounds on the; left heel actually looks improved. Original area on the left foot and proximity to the first and second toes looks about the same. He has superficial areas on the dorsal foot, anterior calf and then the reopening of his original wound on the left lateral calf which looks about the same ooThe only area he has on the right is the dorsal webspace first and second which is smaller. ooHe has [name] large area of dry erythematous skin on the left buttock small open area here. 11/13/2018; the patient arrives in much better condition. The erythema around the wound on the left lateral calf is [name] lot better. Not sure whether this was the clindamycin or the TCA and ketoconazole or just in the improvement in edema control [stasis dermatitis]. In any case this is [name] lot better. The area on the left heel is very small and just about resolved using silver  collagen we have been using silver alginate to the areas on his dorsal feet 11/20/2018; his wounds include the left lateral calf, left heel, dorsal aspects of both feet just proximal to the first second  webspace. He is stable to slightly improved. I did not think any changes to his dressings were going to be necessary 11/27/2018 he has [name] reopening on the left buttock which is surrounded by what looks like tinea or perhaps some other form of dermatitis. The area on the left dorsal foot has some erythema around it I have marked this area but I am not sure whether this is cellulitis or not. Left heel is not closed. Left calf the reopening is really slightly longer and probably worse 1/13; in general things look better and smaller except for the left dorsal foot. Area on the left heel is just about closed, left buttock looks better only [name] small wound remains in the skin looks better [using Lotrisone] 1/20; the area on the left heel only has [name] few remaining open areas here. Left lateral calf about the same in terms of size, left dorsal foot slightly larger right lateral foot still not closed. The area on the left buttock has no open wound and the surrounding skin looks [name] lot better 1/27; the area on the left heel is closed. Left lateral calf better but still requiring extensive debridements. The area on his left buttock is closed. He still has the open areas on the left dorsal foot which is slightly smaller in the right foot which is slightly expanded. We have been using Iodoflex on these areas as well 2/3; left heel is closed. Left lateral calf still requiring debridement using Iodoflex there is no open area on his left buttock however he has dry scaly skin over [name] large area of this. Not really responding well to the Lotrisone. Finally the areas on his dorsal feet at the level of the first second webspace are slightly smaller on the right and about the same on the left. Both of these vigorously debrided with Anasept and gauze 2/10; left heel remains closed he has dry erythematous skin over the left buttock but there is no open wound here. Left lateral leg has come in and with. Still requiring debridement  we have been using Iodoflex here. Finally the area on the left dorsal foot and right dorsal foot are really about the same extremely dry callused fissured areas. He does not yet have [name] dermatology appointment 2/17; left heel remains closed. He has [name] new open area on the left buttock. The area on the left lateral calf is bigger longer and still covered in necrotic debris. No major change in his foot areas bilaterally. I am awaiting for [name] dermatologist to look on this. We have been using ketoconazole I do not know that this is been doing any good at all. 2/24; left heel remains closed. The left buttock wound that was new reopening last week looks better. The left lateral calf appears better also although still requires debridement. The major area on his foot is the left first second also requiring debridement. We have been putting Prisma on all wounds. I do not believe that the ketoconazole has done too much good for his feet. He will use Lotrisone I am going to give him [name] 2-week course of terbinafine. We still do not have [name] dermatology appointment 3/2 left heel remains closed however there is skin over bone in this area I pointed this out to him today.  The left buttock wound is epithelialized but still does not look completely stable. The area on the left leg required debridement were using silver collagen here. With regards to his feet we changed to Lotrisone last week and silver alginate. 3/9; left heel remains closed. Left buttock remains closed. The area on the right foot is essentially closed. The left foot remains unchanged. Slightly smaller on the left lateral calf. Using silver collagen to both of these areas 3/16-Left heel remains closed. Area on right foot is closed. Left lateral calf above the lateral malleolus open wound requiring debridement with easy bleeding. Left dorsal wound proximal to first toe also debrided. Left ischial area open new. Patient has been using Prisma with wrapping  every 3 days. Dermatology appointment is apparently tomorrow.Patient has completed his terbinafine 2-week course with some apparent improvement according to him, there is still flaking and dry skin in his foot on the left 3/23; area on the right foot is reopened. The area on the left anterior foot is about the same still [name] very necrotic adherent surface. He still has the area on the left leg and reopening is on the left buttock. He apparently saw dermatology although I do not have [name] note. According to the patient who is usually fairly well informed they did not have any good ideas. Put him on oral terbinafine which she is been on before. 3/30; using silver collagen to all wounds. Apparently his dermatologist put him on doxycycline and rifampin presumably some culture grew staph. I do not have this result. He remains on terbinafine although I have used terbinafine on him before 4/6; patient has had [name] fairly substantial reopening on the right foot between the first and second toes. He is finished his terbinafine and I believe is on doxycycline and rifampin still as prescribed by dermatology. We have been using silver collagen to all his wounds although the patient reports that he thinks silver alginate does better on the wounds on his buttock. 4/13; the area on his left lateral calf about the same size but it did not require debridement. ooLeft dorsal foot just proximal to the webspace between the first and second toes is about the same. Still nonviable surface. I note some superficial bronze discoloration of the dorsal part of his foot ooRight dorsal foot just proximal to the first and second toes also looks about the same. I still think there may be the same discoloration I noted above on the left ooLeft buttock wound looks about the same 4/20; left lateral calf appears to be gradually contracting using silver collagen. 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 The left dorsal foot wound is debrided of tightly adherent necrotic debris and really cleans up quite nicely. The right area is worse with expansion. I did not debride this it is now over the base of the second toe ooThe area on his left buttock is smaller no debridement is required using silver collagen 5/4; left calf continues to make good progress. ooHe arrives with erythema around the wounds on his dorsal foot which even extends to the plantar aspect. Very concerning for coexistent infection. He is finished the erythromycin I gave him for possible erythrasma this does not seem to have helped. ooThe area on the left foot is about the same base of the dorsal toes ooIs area on the buttock looks improved on the left 5/11; left calf and left buttock continued to make good progress. Left foot is about  the same to slightly improved. ooMajor problem is on the right foot. He has not had an x-ray. Deep tissue culture I did last week showed both Enterobacter and E. coli. I did not change the doxycycline I put him on empirically although neither 1 of these were plated to doxycycline. He arrives today with the erythema looking worse on both the dorsal and plantar foot. Macerated skin on the bottom of the foot. he has not been systemically unwell 5/18-Patient returns at 1 week, left calf wound appears to be making some progress, left buttock wound appears slightly worse than last time, left foot wound looks slightly better, right foot redness is marginally better. X-ray of both feet show no air or evidence of osteomyelitis. Patient is finished his Omnicef and terbinafine. He continues to have macerated skin on the bottom of the left foot as well as right 5/26; left calf wound is better, left buttock wound appears to have multiple small superficial open areas with surrounding macerated skin. X-rays that I did last time showed no evidence of osteomyelitis in either foot. He is  finished cefdinir and doxycycline. I do not think that he was on terbinafine. He continues to have [name] large superficial open area on the right foot anterior dorsal and slightly between the first and second toes. I did send him to dermatology 2 months ago or so wondering about whether they would do [name] fungal scraping. I do not believe they did but did do [name] culture. We have been using silver alginate to the toe areas, he has been using antifungals at home topically either ketoconazole or Lotrisone. We are using silver collagen on the left foot, silver alginate on the right, silver collagen on the left lateral leg and silver alginate on the left buttock 6/1; left buttock area is healed. We have the left dorsal foot, left lateral leg and right dorsal foot. We are using silver alginate to the areas on both feet and silver collagen to the area on his left lateral calf 6/8; the left buttock apparently reopened late last week. He is not really sure how this happened. He is tolerating the terbinafine. Using silver alginate to all wounds 6/15; left buttock wound is larger than last week but still superficial. ooCame in the clinic today with [name] report of purulence from the left lateral leg I did not identify any infection ooBoth areas on his dorsal feet appear to be better. He is tolerating the terbinafine. Using silver alginate to all wounds 6/22; left buttock is about the same this week, left calf quite [name] bit better. His left foot is about the same however he comes in with erythema and warmth in the right forefoot once again. Culture that I gave him in the beginning of May showed Enterobacter and E. coli. I gave him doxycycline and things seem to improve although neither 1 of these organisms was specifically plated. 6/29; left buttock is larger and dry this week. Left lateral calf looks to me to be improved. Left dorsal foot also somewhat improved right foot completely unchanged. The erythema on the right foot  is still present. He is completing the Ceftin dinner that I gave him empirically [see discussion above.) 7/6 - All wounds look to be stable and perhaps improved, the left buttock wound is slightly smaller, per patient bleeds easily, completed ceftin, the right foot redness is less, he is on terbinafine 7/13; left buttock wound about the same perhaps slightly narrower. Area on the left lateral leg continues  to narrow. Left dorsal foot slightly smaller right foot about the same. We are using silver alginate on the right foot and Hydrofera Blue to the areas on the left. Unna boot on the left 2 layer compression on the right 7/20; left buttock wound absolutely the same. Area on lateral leg continues to get better. Left dorsal foot require debridement as did the right no major change in the 7/27; left buttock wound the same size necrotic debris over the surface. The area on the lateral leg is closed once again. His left foot looks better right foot about the same although there is some involvement now of the posterior first second toe area. He is still on terbinafine which I have given him for [name] month, not certain [name] centimeter major change 06/25/19-All wounds appear to be slightly improved according to report, left buttock wound looks clean, both foot wounds have minimal to no debris the right dorsal foot has minimal slough. We are using Hydrofera Blue to the left and silver alginate to the right foot and ischial wound. 8/10-Wounds all appear to be around the same, the right forefoot distal part has some redness which was not there before, however the wound looks clean and small. Ischial wound looks about the same with no changes 8/17; his wound on the left lateral calf which was his original chronic venous insufficiency wound remains closed. Since I last saw him the areas on the left dorsal foot right dorsal foot generally appear better but require debridement. The area on his left initial tuberosity  appears somewhat larger to me perhaps hyper granulated and bleeds very easily. We have been using Hydrofera Blue to the left dorsal foot and silver alginate to everything else 8/24; left lateral calf remains closed. The areas on his dorsal feet on the webspace of the first and second toes bilaterally both look better. The area on the left buttock which is the pressure ulcer stage II slightly smaller. I change the dressing to Hydrofera Blue to all areas 8/31; left lateral calf remains closed. The area on his dorsal feet bilaterally look better. Using Hydrofera Blue. Still requiring debridement on the left foot. No change in the left buttock pressure ulcers however 9/14; left lateral calf remains closed. Dorsal feet look quite [name] bit better than 2 [name] ago. Flaking dry skin also [name] lot better with the ammonium lactate I gave him 2 [name] ago. The area on the left buttock is improved. He states that his Roho cushion developed [name] leak and he is getting [name] new one, in the interim he is offloading this vigorously 9/21; left calf remains closed. Left heel which was [name] possible DTI looks better this week. He had macerated tissue around the left dorsal foot right foot looks satisfactory and improved left buttock wound. I changed his dressings to his feet to silver alginate bilaterally. Continuing Hydrofera Blue on the left buttock. 9/28 left calf remains closed. Left heel did not develop anything [possible DTI] dry flaking skin on the left dorsal foot. Right foot looks satisfactory. Improved left buttock wound. We are using silver alginate on his feet Hydrofera Blue on the buttock. I have asked him to go back to the Lotrisone on his feet including the wounds and surrounding areas 10/5; left calf remains closed. The areas on the left and right feet about the same. [name] lot of this is epithelialized however debris over the remaining open areas. He is using Lotrisone and silver alginate. The area on the left buttock  using Hydrofera Blue 10/26. Patient has been out for 3 [name] secondary to Covid concerns. He tested negative but I think his wife tested positive. He comes in today with the left foot substantially worse, right foot about the same. Even more concerning he states that the area on his left buttock closed over but then reopened and is considerably deeper in one aspect than it was before [stage III wound] 11/2; left foot really about the same as last week. Quarter sized wound on the dorsal foot just proximal to the first second toes. Surrounding erythema with areas of denuded epithelium. This is not really much different looking. Did not look like cellulitis this time however. ooRight foot area about the same.. We have been using silver alginate alginate on his toes ooLeft buttock still substantial irritated skin around the wound which I think looks somewhat better. We have been using Hydrofera Blue here. 11/9; left foot larger than last week and [name] very necrotic surface. Right foot I think is about the same perhaps slightly smaller. Debris around the circumference also addressed. Unfortunately on the left buttock there is been [name] decline. Satellite lesions below the major wound distally and now [name] an additional one posteriorly we have been using Hydrofera Blue but I think this is [name] pressure issue 11/16; left foot ulcer dorsally again [name] very adherent necrotic surface. Right foot is about the same. Not much change in the pressure ulcer on his left buttock. 11/30; left foot ulcer dorsally basically the same as when I saw him 2 [name] ago. Very adherent fibrinous debris on the wound surface. Patient reports [name] lot of drainage as well. 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 We have been using Iodoflex, patient changed back to alginate because of the drainage. Area on his right dorsal foot really looks benign with [name] healthier surface certainly [name] lot better than  on the left. Left buttock wounds all improved using Hydrofera Blue 12/7; left dorsal foot again no improvement. Tightly adherent debris. PCR culture I did last week only showed likely skin contaminant. I have gone ahead and done [name] punch biopsy of this which is about the last thing in terms of investigations I can think to do. He has known venous insufficiency and venous hypertension and this could be the issue here. The area on the right foot is about the same left buttock slightly worse according to our intake nurse secondary to [facility] Blue sticking to the wound 12/14; biopsy of the left foot that I did last time showed changes that could be related to wound healing/chronic stasis dermatitis phenomenon no neoplasm. We have been using silver alginate to both feet. I change the one on the left today to Sorbact and silver alginate to his other 2 wounds 12/28; the patient arrives with the following problems; ooMajor issue is the dorsal left foot which continues to be [name] larger deeper wound area. Still with [name] completely nonviable surface ooParadoxically the area mirror image on the right on the right dorsal foot appears to be getting better. ooHe had some loss of dry denuded skin from the lower part of his original wound on the left lateral calf. Some of this area looked [name] little vulnerable and for this reason we put him in wrap that on this side this week ooThe area on his left buttock is larger. He still has the erythematous circular area which I think is [name] combination of pressure, sweat. This does not  look like cellulitis or fungal dermatitis 11/26/2019; -Dorsal left foot large open wound with depth. Still debris over the surface. Using Sorbact ooThe area on the dorsal right foot paradoxically has closed over [facility] has [name] reopening on the left ankle laterally at the base of his original wound that extended up into the calf. This appears clean. ooThe left buttock wound is smaller but with very  adherent necrotic debris over the surface. We have been using silver alginate here as well The patient had arterial studies done in 2017. He had biphasic waveforms at the dorsalis pedis and posterior tibial bilaterally. ABI in the left was 1.17. Digit waveforms were dampened. He has slight spasticity in the great toes I do not think [name] TBI would be possible 1/11; the patient comes in today with [name] sizable reopening between the first and second toes on the right. This is not exactly in the same location where we have been treating wounds previously. According to our intake nurse this was actually fairly deep but 0.6 cm. The area on the left dorsal foot looks about the same the surface is somewhat cleaner using Sorbact, his MRI is in 2 days. We have not managed yet to get arterial studies. The new reopening on the left lateral calf looks somewhat better using alginate. The left buttock wound is about the same using alginate 1/18; the patient had his ARTERIAL studies which were quite normal. ABI in the right at 1.13 with triphasic/biphasic waveforms on the left ABI 1.06 again with triphasic/biphasic waveforms. It would not have been possible to have done [name] toe brachial index because of spasticity. We have been using Sorbac to the left foot alginate to the rest of his wounds on the right foot left lateral calf and left buttock 1/25; arrives in clinic with erythema and swelling of the left forefoot worse over the first MTP area. This extends laterally dorsally and but also posteriorly. Still has an area on the left lateral part of the lower part of his calf wound it is eschared and clearly not closed. ooArea on the left buttock still with surrounding irritation and erythema. ooRight foot surface wound dorsally. The area between the right and first and second toes appears better. 2/1; ooThe left foot wound is about the same. Erythema slightly better I gave him [name] week of doxycycline empirically ooRight  foot wound is more extensive extending between the toes to the plantar surface ooLeft lateral calf really no open surface on the inferior part of his original wound however the entire area still looks vulnerable ooAbsolutely no improvement in the left buttock wound required debridement. 2/8; the left foot is about the same. Erythema is slightly improved I gave him clindamycin last week. ooRight foot looks better he is using Lotrimin and silver alginate ooHe has [name] breakdown in the left lateral calf. Denuded epithelium which I have removed ooLeft buttock about the same were using Hydrofera Blue 2/15; left foot is about the same there is less surrounding erythema. Surface still has tightly adherent debris which I have debriding however not making any progress ooRight foot has [name] substantial wound on the medial right second toe between the first and second webspace. ooStill an open area on the left lateral calf distal area. ooButtock wound is about the same 2/22; left foot is about the same less surrounding erythema. Surface has adherent debris. Polymen Ag Right foot area significant wound between the first and second toes. We have been using silver alginate here Left lateral  leg polymen Ag at the base of his original venous insufficiency wound ooLeft buttock some improvement here 3/1; ooRight foot is deteriorating in the first second toe webspace. Larger and more substantial. We have been using silver alginate. ooLeft dorsal foot about the same markedly adherent surface debris using PolyMem Ag ooLeft lateral calf surface debris using PolyMem AG ooLeft buttock is improved again using PolyMem Ag. ooHe is completing his terbinafine. The erythema in the foot seems better. He has been on this for 2 [name] 3/8; no improvement in any wound area in fact he has [name] small open area on the dorsal midfoot which is new this week. He has not gotten his foot x-rays yet 3/15; his x-rays were both  negative for osteomyelitis of both feet. No major change in any of his wounds on the extremities however his buttock wounds are better. We have been using polymen on the buttocks, left lower leg. Iodoflex on the left foot and silver alginate on the right 3/22; arrives in clinic today with the 2 major issues are the improvement in the left dorsal foot wound which for once actually looks healthy with [name] nice healthy wound surface without debridement. Using Iodoflex here. Unfortunately on the left lateral calf which is in the distal part of his original wound he came to the clinic here for there was purulent drainage noted some increased breakdown scattered around the original area and [name] small area proximally. We we are using polymen here will change to silver alginate today. His buttock wound on the left is better and I think the area on the right first second toe webspace is also improved 3/29; left dorsal foot looks better. Using Iodoflex. Left ankle culture from deterioration last time grew E. coli, Enterobacter and Enterococcus. I will give him [name] course of cefdinir although that will not cover Enterococcus. The area on the right foot in the webspace of the first and second toe lateral first toe looks better. The area on his buttock is about healed Vascular appointment is on April 21. This is to look at his venous system vis--vis continued breakdown of the wounds on the left including the left lateral leg and left dorsal foot he. He has had previous ablations on this side 4/5; the area between the right first and second toes lateral aspect of the first toe looks better. Dorsal aspect of the left first toe on the left foot also improved. Unfortunately the left lateral lower leg is larger and there is [name] second satellite wound superiorly. The usual superficial abrasions on the left buttock overall better but certainly not closed 4/12; the area between the right first and second toes is improved. Dorsal  aspect of the left foot also slightly smaller with [name] vibrant healthy looking surface. No real change in the left lateral leg and the left buttock wound is healed He has an unaffordable co-pay for Apligraf. Appointment with vein and vascular with regards to the left leg venous part of the circulation is on 4/21 4/19; we continue to see improvement in all wound areas. Although this is minor. He has his vascular appointment on 4/21. The area on the left buttock has not reopened although right in the center of this area the skin looks somewhat threatened 4/26; the left buttock is unfortunately reopened. In general his left dorsal foot has [name] healthy surface and looks somewhat smaller although it was not measured as such. The area between his first and second toe webspace on the right as  [name] small wound against the first toe. The patient saw vascular surgery. The real question I was asking was about the small saphenous vein on the left. He has previously ablated left greater saphenous vein. Nothing further was commented on on the left. Right greater saphenous vein without reflux at the saphenofemoral junction or proximal thigh there was no indication for ablation of the right greater saphenous vein duplex was negative for DVT bilaterally. They did not think there was anything from [name] vascular surgery point of view that could be offered. They ABIs within normal limits 5/3; only small open area on the left buttock. The area on the left lateral leg which was his original venous reflux is now 2 wounds both which look clean. We are using Iodoflex on the left dorsal foot which looks healthy and smaller. He is down to [name] very tiny area between the right first and second toes, using silver alginate 5/10; all of his wounds appear better. We have much better edema control in 4 layer compression on the left. This may be the factor that is allowing the left foot and left lateral calf to heal. He has external compression  garments at home Objective Constitutional Sitting or standing Blood Pressure is within target range for patient.. Pulse regular and within target range for patient.Marland Kitchen Respirations regular, non-labored and within target range.. Temperature is normal and within the target range for the patient.Marland Kitchen Appears in no distress. Vitals Time Taken: 7:47 AM, Height: 70 in, Weight: 216 lbs, BMI: 31, Temperature: 97.9 F, Pulse: 98 bpm, Respiratory Rate: 18 breaths/min, Blood Pressure: 119/76 mmHg. Cardiovascular We have much better edema control on the left. General Notes: Wound exam ooLeft foot dorsally continues to contract we have been using Iodoflex. ooLeft lateral ankle part of his original wound in this clinic appears to be receding nicely ooDifficult to tell between the first and second toes on the right whether anything is open at all there may be something that is right against the medial first toe but given his spasticity it is really difficult to tell. ooLeft buttock still [name] large area of excoriated skin flaking breaking down. Integumentary (Hair, Skin) No evidence of infection in any wound area. Wound #24 status is Open. Original cause of wound was Gradually Appeared. The wound is located on the Left,Dorsal Foot. The wound measures 1.4cm length x 1.2cm width x 0.1cm depth; 1.319cm^2 area and 0.132cm^3 volume. There is Fat Layer (Subcutaneous Tissue) Exposed exposed. There is no tunneling or undermining noted. There is [name] medium amount of serosanguineous drainage noted. The wound margin is flat and intact. There is large (67-100%) red granulation within the wound bed. There is no necrotic tissue within the wound bed. Wound #37 status is Open. Original cause of wound was Gradually Appeared. The wound is located on the Left,Lateral Malleolus. The wound measures 2.5cm length x 0.9cm width x 0.1cm depth; 1.767cm^2 area and 0.177cm^3 volume. There is Fat Layer (Subcutaneous Tissue) Exposed exposed.  There is no tunneling or undermining noted. There is [name] medium amount of serosanguineous drainage noted. The wound margin is flat and intact. There is large (67-100%) red, friable granulation within the wound bed. There is no necrotic tissue within the wound bed. Wound #38 status is Open. Original cause of wound was Gradually Appeared. The wound is located on the Right T - Web between 1st and 2nd. The wound oe measures 0.4cm length x 0.1cm width x 0.2cm depth; 0.031cm^2 area and 0.006cm^3 volume. There is Fat Layer (Subcutaneous  Tissue) Exposed exposed. There is no tunneling or undermining noted. There is [name] small amount of serosanguineous drainage noted. The wound margin is flat and intact. There is large (67- 100%) red granulation within the wound bed. There is no necrotic tissue within the wound bed. Wound #40 status is Healed - Epithelialized. Original cause of wound was Gradually Appeared. The wound is located on the Left,Lateral Lower Leg. The wound measures 0cm length x 0cm width x 0cm depth; 0cm^2 area and 0cm^3 volume. Wound #41 status is Open. Original cause of wound was Gradually Appeared. The wound is located on the Left Ischium. The wound measures 0.5cm length x 0.2cm width x 0.1cm depth; 0.079cm^2 area and 0.008cm^3 volume. There is Fat Layer (Subcutaneous Tissue) Exposed exposed. There is no tunneling or undermining noted. There is [name] medium amount of serosanguineous drainage noted. The wound margin is flat and intact. There is large (67-100%) red granulation within the wound bed. There is no necrotic tissue within the wound bed. 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 There was [name] Four Layer Compression Therapy Procedure by [name] Hurst, [name]. Post procedure Diagnosis Wound #37: Same as Pre-Procedure Plan Follow-up Appointments: Return Appointment in 1 week. Dressing Change Frequency: Wound #24 Left,Dorsal Foot: Do not change entire dressing for one week. Wound #37 Left,Lateral Malleolus: Do not change entire dressing for one week. Wound #38 Right T - Web between 1st and 2nd: oe Change Dressing every other day. Wound #41 Left Ischium: Change Dressing every other day. Skin Barriers/Peri-Wound Care: Antifungal cream - on toes on both feet daily Barrier cream - to leg/ankle and left foot Moisturizing lotion - both legs TCA Cream or Ointment Primary Wound Dressing: Wound #24 Left,Dorsal Foot: Iodoflex Wound #37 Left,Lateral Malleolus: Hydrofera Blue Wound #38 Right T - Web between 1st and 2nd: oe Calcium Alginate with Silver Wound #41 Left Ischium: Calcium Alginate with Silver Secondary Dressing: Wound #24 Left,Dorsal Foot: Dry Gauze ABD pad Wound #37 Left,Lateral Malleolus: Dry Gauze ABD pad Wound #38 Right T - Web between 1st and 2nd: oe Kerlix/Rolled Gauze - secure with tape Dry Gauze Wound #41 Left Ischium: Dry Gauze ABD pad Edema Control: 4 layer compression: Left lower extremity Elevate legs to the level of the heart or above for 30 minutes daily and/or when sitting, [name] frequency of: - throughout the day Support Garment 30-40 mm/Hg pressure to: - Juxtalite to right leg Off-Loading: Low air-loss mattress (Group 2) Roho cushion for wheelchair Turn and reposition every 2 hours - out of wheelchair throughout the day, try to lay on sides, sleep in the bed not the recliner 1. I did not change the primary dressings from any area. This  includes ooHydrofera Blue on the left lateral leg ooIodoflex on the left dorsal foot ooSilver alginate in the webspace between his first and second toe ooSilver alginate on the buttock #2 we have much better edema control this week 3. He has external compression garments will need to set these in 30/40 compression in the eventuality things actually closed. 4. I am not  sure how to deal with the skin on his left buttock continued excoriated look but not infected Electronic Signature(s) Signed: 04/01/2020 8:11:21 AM By: Linton Ham MD Entered By: Linton [name] on 03/31/2020 [id number] -------------------------------------------------------------------------------- SuperBill Details Patient Name: Date of Service: [name], [name] 03/31/2020 Medical Record Number: [id number] Patient Account Number: [id number] Date of Birth/Sex: Treating [name]: 09/20/1986 (34 y.o. [name] Primary Care Provider: [facility], [place] Other Clinician: Referring Provider: Treating Provider/Extender: [name] in Treatment: 221 Diagnosis Coding ICD-10 Codes Code Description I87.332 Chronic venous hypertension (idiopathic) with ulcer and inflammation of left lower extremity L97.321 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 to breakdown of skin L97.521 Non-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 L97.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, 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, complete Facility Procedures CPT4 Code: [id number] Description: (Facility Use Only) [id number] - APPLY MULTLAY COMPRS LWR LT LEG Modifier: Quantity: 1 Physician Procedures : CPT4 Code Description Modifier [id number] - WC PHYS LEVEL 3 - EST PT ICD-10 Diagnosis Description I87.332 Chronic venous hypertension (idiopathic) with ulcer and inflammation of left lower extremity L97.321  Non-pressure chronic ulcer of left ankle  limited to breakdown of skin L97.521 Non-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 L97.511 Non-pressure chronic ulcer of other part of right foot limited to breakdown of skin Quantity: 1 Electronic Signature(s) Signed: 04/01/2020 8:11:21 AM By: Linton Ham MD Signed: 04/01/2020 5:17:28 PM By: [name], [name] Entered By: [name] Hurst on 03/31/2020 08:51:26

## 2020-04-01 NOTE — Progress Notes (Signed)
[name] [name] LINDIG ([id number]) Visit Report for 03/31/2020 Arrival Information Details Patient Name: Date of Service: [name] [name] Side. 03/31/2020 7:30 [name] M Medical Record Number: [id number] Patient Account Number: [id number] Date of Birth/Sex: Treating RN: 1986/11/01 (34 y.o. [name] Primary Care Ericka Marcellus: [place], [place] Other Clinician: Referring Luanne Krzyzanowski: Treating Lavera Vandermeer/Extender: [name]: 32 Visit Information History Since Last Visit Added or deleted any medications: No Patient Arrived: Wheel Chair Any new allergies or adverse reactions: No Arrival Time: 07:45 Signs or symptoms of abuse/neglect since last visito No Accompanied By: self Hospitalized since last visit: No Transfer Assistance: Manual Implantable device outside of the clinic excluding No Patient Identification Verified: Yes cellular tissue based products placed in the center Secondary Verification Process Completed: Yes since last visit: Patient Requires Transmission-Based Precautions: No Has Dressing in Place as Prescribed: Yes Patient Has Alerts: Yes Pain Present Now: No Patient Alerts: R ABI = 1.0 [name] = 1.1 Electronic Signature(s) Signed: 04/01/2020 8:52:48 AM By: Sandre Kitty Entered By: Sandre Kitty on 03/31/2020 07:47:22 -------------------------------------------------------------------------------- Compression Therapy Details Patient Name: Date of Service: [name] [name] [name] 03/31/2020 7:30 [name] M Medical Record Number: [id number] Patient Account Number: [id number] Date of Birth/Sex: Treating RN: 04/04/1986 (34 y.o. [name] Primary Care Boss Danielsen: [place], [place] Other Clinician: Referring Kelyse Pask: Treating Brice Potteiger/Extender: [name]: 221 Compression Therapy Performed for Wound Assessment: Wound #37 Left,Lateral Malleolus Performed By: Clinician Levan Hurst, RN Compression Type: Four Layer Post  Procedure Diagnosis Same as Pre-procedure Electronic Signature(s) Signed: 04/01/2020 5:17:28 PM By: Levan Hurst RN, BSN Entered By: Levan Hurst on 03/31/2020 08:10:54 -------------------------------------------------------------------------------- Encounter Discharge Information Details Patient Name: Date of Service: [name] [name] [name] 03/31/2020 7:30 [name] M Medical Record Number: [id number] Patient Account Number: [id number] Date of Birth/Sex: Treating RN: 08-04-1986 (34 y.o. [name] Primary Care Veria Stradley: [place], [place] Other Clinician: Referring Yani Coventry: Treating Lafawn Lenoir/Extender: [name]: 221 Encounter Discharge Information Items Discharge Condition: Stable Ambulatory Status: Wheelchair Discharge Destination: Home Transportation: Private Auto Accompanied By: self Schedule Follow-up Appointment: Yes Clinical Summary of Care: Electronic Signature(s) Signed: 03/31/2020 5:18:32 PM By: Deon Pilling Entered By: Deon Pilling on 03/31/2020 08:28:06 -------------------------------------------------------------------------------- Lower Extremity Assessment Details Patient Name: Date of Service: [name] [name] [name] 03/31/2020 7:30 [name] M Medical Record Number: [id number] Patient Account Number: [id number] Date of Birth/Sex: Treating RN: 1986/01/04 (34 y.o. [name] Primary Care Harmoni Lucus: [name], [name] Other Clinician: Referring Renalda Locklin: Treating Jerra Huckeby/Extender: [name]: 221 Edema Assessment Assessed: [Left: Yes] [Right: Yes] Edema: [Left: No] [Right: Yes] Calf Left: Right: Point of Measurement: 33 cm From Medial Instep 25 cm 33 cm Ankle Left: Right: Point of Measurement: 10 cm From Medial Instep 23.5 cm 23 cm Electronic Signature(s) Signed: 03/31/2020 5:18:32 PM By: Deon Pilling Signed: 04/01/2020 5:17:28 PM By: Levan Hurst RN, BSN Entered By: Deon Pilling on 03/31/2020  07:53:53 -------------------------------------------------------------------------------- Multi Wound Chart Details Patient Name: Date of Service: [name] [name] [name] 03/31/2020 7:30 [name] M Medical Record Number: [id number] Patient Account Number: [id number] Date of Birth/Sex: Treating RN: April 23, 1986 (34 y.o. [name] Primary Care Honor Fairbank: [name], [name] Other Clinician: Referring Meena Barrantes: Treating Roslin Norwood/Extender: [name]: 221 Vital Signs Height(in): 70 Pulse(bpm): 98 Weight(lbs): 216 Blood Pressure(mmHg): 119/76 Body Mass Index(BMI): 31 Temperature(F): 97.9 Respiratory Rate(breaths/min): 18 Photos: [24:No Photos Left, Dorsal Foot] [37:No Photos Left, Lateral Malleolus] [38:No Photos Right T - Web between 1st  and 2nd oe] Wound Location: [24:Gradually Appeared] [37:Gradually Appeared] [38:Gradually Appeared] Wounding Event: [24:Inflammatory] [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] [38:Inflammatory] Primary Etiology: [24:N/[name]] [37:N/[name]] [38:N/[name]] Secondary Etiology: [24:Sleep Apnea, Hypertension, Paraplegia Sleep Apnea, Hypertension, Paraplegia Sleep Apnea, Hypertension, Paraplegia] Comorbid History: [24:03/08/2018] [37:11/26/2019] [38:11/30/2019] Date Acquired: [id number] [37:18] [38:17] Weeks of Treatment: [24:Open] [37:Open] [38:Open] Wound Status: [24:Yes] [37:No] [38:No] Clustered Wound: [24:1] [37:N/[name]] [38:N/[name]] Clustered Quantity: [24:1.4x1.2x0.1] [37:2.5x0.9x0.1] [38:0.4x0.1x0.2] Measurements [name] x W x D (cm) [24:1.319] [37:1.767] [38:0.031] [name] (cm) : rea [24:0.132] [37:0.177] [38:0.006] Volume (cm) : [24:-458.90%] [37:55.40%] [38:90.60%] % Reduction in [name] rea: [24:-450.00%] [37:55.30%] [38:97.40%] % Reduction in Volume: [24:Full Thickness Without Exposed] [37:Full Thickness Without Exposed] [38:Full Thickness Without Exposed] Classification: [24:Support Structures Medium] [37:Support Structures Medium] [38:Support Structures Small] Exudate Amount:  [24:Serosanguineous] [37:Serosanguineous] [38:Serosanguineous] Exudate Type: [24:red, brown] [37:red, brown] [38:red, brown] Exudate Color: [24:Flat and Intact] [37:Flat and Intact] [38:Flat and Intact] Wound Margin: [24:Large (67-100%)] [37:Large (67-100%)] [38:Large (67-100%)] Granulation Amount: [24:Red] [37:Red, Friable] [38:Red] Granulation Quality: [24:None Present (0%)] [37:None Present (0%)] [38:None Present (0%)] Necrotic Amount: [24:Fat Layer (Subcutaneous Tissue)] [37:Fat Layer (Subcutaneous Tissue)] [38:Fat Layer (Subcutaneous Tissue)] Exposed Structures: [24:Exposed: Yes Fascia: No Tendon: No Muscle: No Joint: No Bone: No Large (67-100%)] [37:Exposed: Yes Fascia: No Tendon: No Muscle: No Joint: No Bone: No Large (67-100%)] [38:Exposed: Yes Fascia: No Tendon: No Muscle: No Joint: No Bone: No Large  (67-100%)] Epithelialization: [24:N/[name]] [37:Compression Therapy] [38:N/[name]] Wound Number: [id number] N/[name] Photos: No Photos No Photos N/[name] Left, Lateral Lower Leg Left Ischium N/[name] Wound Location: Gradually Appeared Gradually Appeared N/[name] Wounding Event: Venous Leg Ulcer Pressure Ulcer N/[name] Primary Etiology: Lymphedema N/[name] N/[name] Secondary Etiology: N/[name] Sleep Apnea, Hypertension, Paraplegia N/[name] Comorbid History: 02/11/2020 03/16/2020 N/[name] Date Acquired: 7 2 N/[name] Weeks of Treatment: Healed - Epithelialized Open N/[name] Wound Status: No No N/[name] Clustered Wound: N/[name] N/[name] N/[name] Clustered Quantity: 0x0x0 0.5x0.2x0.1 N/[name] Measurements [name] x W x D (cm) 0 0.079 N/[name] [name] (cm) : rea 0 0.008 N/[name] Volume (cm) : 100.00% 95.60% N/[name] % Reduction in [name] rea: 100.00% 95.60% N/[name] % Reduction in Volume: Full Thickness Without Exposed Category/Stage II N/[name] Classification: Support Structures N/[name] Medium N/[name] Exudate Amount: N/[name] Serosanguineous N/[name] Exudate Type: N/[name] red, brown N/[name] Exudate Color: N/[name] Flat and Intact N/[name] Wound Margin: N/[name] Large (67-100%) N/[name] Granulation Amount: N/[name] Red N/[name] Granulation  Quality: N/[name] None Present (0%) N/[name] Necrotic Amount: N/[name] Fat Layer (Subcutaneous Tissue) N/[name] Exposed Structures: Exposed: Yes Fascia: No Tendon: No Muscle: No Joint: No Bone: No N/[name] Large (67-100%) N/[name] Epithelialization: N/[name] N/[name] N/[name] Procedures Performed: Treatment Notes Electronic Signature(s) Signed: 04/01/2020 8:11:21 AM By: Linton Ham MD Signed: 04/01/2020 5:17:28 PM By: Levan Hurst RN, BSN Entered By: Linton Ham on 03/31/2020 08:20:07 -------------------------------------------------------------------------------- Multi-Disciplinary Care Plan Details Patient Name: Date of Service: [name] [name] [name] 03/31/2020 7:30 [name] M Medical Record Number: [id number] Patient Account Number: [id number] Date of Birth/Sex: Treating RN: Mar 25, 1986 (34 y.o. [name] Primary Care Shailynn Fong: [name], [name] Other Clinician: Referring Anvay Tennis: Treating Hildegarde Dunaway/Extender: [name]: 221 Active Inactive Wound/Skin Impairment Nursing Diagnoses: Impaired tissue integrity Knowledge deficit related to ulceration/compromised skin integrity Goals: Patient/caregiver will verbalize understanding of skin care regimen Date Initiated: 01/05/2016 Target Resolution Date: 04/11/2020 Goal Status: Active Ulcer/skin breakdown will have [name] volume reduction of 30% by week 4 Date Initiated: 01/05/2016 Date Inactivated: 12/22/2017 Target Resolution Date: 01/19/2018 Unmet Reason: complex wounds, Goal Status: Unmet infection Interventions: Assess patient/caregiver ability to obtain necessary supplies Assess ulceration(s) every visit Provide education on ulcer  and skin care Notes: Electronic Signature(s) Signed: 04/01/2020 5:17:28 PM By: Levan Hurst RN, BSN Entered By: Levan Hurst on 03/31/2020 07:57:44 -------------------------------------------------------------------------------- Pain Assessment Details Patient Name: Date of Service: [name] [name] [name]  E. 03/31/2020 7:30 [name] M Medical Record Number: [id number] Patient Account Number: [id number] Date of Birth/Sex: Treating RN: Jul 25, 1986 (34 y.o. [name] Primary Care Xavi Tomasik: [place], [place] Other Clinician: Referring Tuwanna Krausz: Treating Rin Gorton/Extender: [name]: 221 Active Problems Location of Pain Severity and Description of Pain Patient Has Paino No Site Locations Pain Management and Medication Current Pain Management: Electronic Signature(s) Signed: 04/01/2020 8:52:48 AM By: Sandre Kitty Signed: 04/01/2020 5:17:28 PM By: Levan Hurst RN, BSN Entered By: Sandre Kitty on 03/31/2020 07:47:49 -------------------------------------------------------------------------------- Patient/Caregiver Education Details Patient Name: Date of Service: [name] [name] Side 5/10/2021andnbsp7:30 [name] M Medical Record Number: [id number] Patient Account Number: [id number] Date of Birth/Gender: Treating RN: [birth year] (34 y.o. [name] Primary Care Physician: Janine Limbo Other Clinician: Referring Physician: Treating Physician/Extender: [name]: 33 Education Assessment Education Provided To: Patient Education Topics Provided Wound/Skin Impairment: Methods: Explain/Verbal Responses: State content correctly Electronic Signature(s) Signed: 04/01/2020 5:17:28 PM By: Levan Hurst RN, BSN Entered By: Levan Hurst on 03/31/2020 07:58:00 -------------------------------------------------------------------------------- Wound Assessment Details Patient Name: Date of Service: [name] [name] [name] 03/31/2020 7:30 [name] M Medical Record Number: [id number] Patient Account Number: [id number] Date of Birth/Sex: Treating RN: Jul 01, 1986 (34 y.o. [name] Primary Care Eliyanna Ault: [name], [name] Other Clinician: Referring Ja Ohman: Treating Dorlisa Savino/Extender: [name] in  Treatment: 221 Wound Status Wound Number: 24 Primary Etiology: Inflammatory Wound Location: Left, Dorsal Foot Wound Status: Open Wounding Event: Gradually Appeared Comorbid History: Sleep Apnea, Hypertension, Paraplegia Date Acquired: 03/08/2018 Weeks Of Treatment: 107 Clustered Wound: Yes Photos Photo Uploaded By: Mikeal Hawthorne on 04/01/2020 10:35:40 Wound Measurements Length: (cm) 1.4 Width: (cm) 1.2 Depth: (cm) 0.1 Clustered Quantity: 1 Area: (cm) 1.3 Volume: (cm) 0.1 % Reduction in Area: -458.9% % Reduction in Volume: -450% Epithelialization: Large (67-100%) Tunneling: No 19 Undermining: No 32 Wound Description Classification: Full Thickness Without Exposed Support Stru Wound Margin: Flat and Intact Exudate Amount: Medium Exudate Type: Serosanguineous Exudate Color: red, brown ctures Foul Odor After Cleansing: No Slough/Fibrino No Wound Bed Granulation Amount: Large (67-100%) Exposed Structure Granulation Quality: Red Fascia Exposed: No Necrotic Amount: None Present (0%) Fat Layer (Subcutaneous Tissue) Exposed: Yes Tendon Exposed: No Muscle Exposed: No Joint Exposed: No Bone Exposed: No Treatment Notes Wound #24 (Left, Dorsal Foot) 1. Cleanse With Wound Cleanser Soap and water 2. Periwound Care Antifungal cream Barrier cream Moisturizing lotion TCA Cream 3. Primary Dressing Applied Iodoflex 4. Secondary Dressing Dry Gauze 6. Support Layer Applied 4 layer compression wrap Electronic Signature(s) Signed: 03/31/2020 5:18:32 PM By: Deon Pilling Signed: 04/01/2020 5:17:28 PM By: Levan Hurst RN, BSN Entered By: Deon Pilling on 03/31/2020 07:55:06 -------------------------------------------------------------------------------- Wound Assessment Details Patient Name: Date of Service: [name] [name] [name] 03/31/2020 7:30 [name] M Medical Record Number: [id number] Patient Account Number: [id number] Date of Birth/Sex: Treating RN: [birth year] (34 y.o. [name] Primary Care Keymon Mcelroy: [place], [place] Other Clinician: Referring Megen Madewell: Treating Najiyah Paris/Extender: [name]: 221 Wound Status Wound Number: 37 Primary Etiology: Venous Leg Ulcer Wound Location: Left, Lateral Malleolus Wound Status: Open Wounding Event: Gradually Appeared Comorbid History: Sleep Apnea, Hypertension, Paraplegia Date Acquired: 11/26/2019 Weeks Of Treatment: 18 Clustered Wound: No Photos Photo Uploaded By: Mikeal Hawthorne on 04/01/2020 10:35:41 Wound Measurements Length: (cm)  2.5 Width: (cm) 0.9 Depth: (cm) 0.1 Area: (cm) 1.767 Volume: (cm) 0.177 % Reduction in Area: 55.4% % Reduction in Volume: 55.3% Epithelialization: Large (67-100%) Tunneling: No Undermining: No Wound Description Classification: Full Thickness Without Exposed Support Structures Wound Margin: Flat and Intact Exudate Amount: Medium Exudate Type: Serosanguineous Exudate Color: red, brown Foul Odor After Cleansing: No Slough/Fibrino No Wound Bed Granulation Amount: Large (67-100%) Exposed Structure Granulation Quality: Red, Friable Fascia Exposed: No Necrotic Amount: None Present (0%) Fat Layer (Subcutaneous Tissue) Exposed: Yes Tendon Exposed: No Muscle Exposed: No Joint Exposed: No Bone Exposed: No Treatment Notes Wound #37 (Left, Lateral Malleolus) 1. Cleanse With Wound Cleanser Soap and water 2. Periwound Care Antifungal cream Barrier cream Moisturizing lotion TCA Cream 3. Primary Dressing Applied Hydrofera Blue 4. Secondary Dressing ABD Pad 6. Support Layer Applied 4 layer compression wrap Electronic Signature(s) Signed: 03/31/2020 5:18:32 PM By: Deon Pilling Signed: 04/01/2020 5:17:28 PM By: Levan Hurst RN, BSN Entered By: Deon Pilling on 03/31/2020 07:55:53 -------------------------------------------------------------------------------- Wound Assessment Details Patient Name: Date of Service: [name] [name] [name]  03/31/2020 7:30 [name] M Medical Record Number: [id number] Patient Account Number: [id number] Date of Birth/Sex: Treating RN: [birth year] (34 y.o. [name] Primary Care Aryssa Rosamond: [name], [name] Other Clinician: Referring Rasheena Talmadge: Treating Natina Wiginton/Extender: [name]: 221 Wound Status Wound Number: 38 Primary Etiology: Inflammatory Wound Location: Right T - Web between 1st and 2nd oe Wound Status: Open Wounding Event: Gradually Appeared Comorbid History: Sleep Apnea, Hypertension, Paraplegia Date Acquired: 11/30/2019 Weeks Of Treatment: 17 Clustered Wound: No Photos Photo Uploaded By: Mikeal Hawthorne on 04/01/2020 10:36:09 Wound Measurements Length: (cm) 0.4 Width: (cm) 0.1 Depth: (cm) 0.2 Area: (cm) 0.031 Volume: (cm) 0.006 % Reduction in Area: 90.6% % Reduction in Volume: 97.4% Epithelialization: Large (67-100%) Tunneling: No Undermining: No Wound Description Classification: Full Thickness Without Exposed Support Structures Wound Margin: Flat and Intact Exudate Amount: Small Exudate Type: Serosanguineous Exudate Color: red, brown Foul Odor After Cleansing: No Slough/Fibrino No Wound Bed Granulation Amount: Large (67-100%) Exposed Structure Granulation Quality: Red Fascia Exposed: No Necrotic Amount: None Present (0%) Fat Layer (Subcutaneous Tissue) Exposed: Yes Tendon Exposed: No Muscle Exposed: No Joint Exposed: No Bone Exposed: No Treatment Notes Wound #38 (Right Toe - Web between 1st and 2nd) 1. Cleanse With Wound Cleanser 2. Periwound Care Antifungal cream Barrier cream Moisturizing lotion TCA Cream 3. Primary Dressing Applied Calcium Alginate Ag 4. Secondary Dressing Dry Gauze Roll Gauze 5. Secured With Medipore tape 6. Support Layer Applied Compression garment Notes Development worker, international aid) Signed: 03/31/2020 5:18:32 PM By: Deon Pilling Signed: 04/01/2020 5:17:28 PM By: Levan Hurst RN,  BSN Entered By: Deon Pilling on 03/31/2020 07:56:44 -------------------------------------------------------------------------------- Wound Assessment Details Patient Name: Date of Service: Patino, [name] [name] 03/31/2020 7:30 [name] M Medical Record Number: [id number] Patient Account Number: [id number] Date of Birth/Sex: Treating RN: Mar 26, 1986 (34 y.o. [name] Primary Care Christiann Hagerty: [place], [place] Other Clinician: Referring Evalyn Shultis: Treating Antrone Walla/Extender: [place], [name]: 221 Wound Status Wound Number: 40 Primary Etiology: Venous Leg Ulcer Wound Location: Left, Lateral Lower Leg Secondary Etiology: Lymphedema Wounding Event: Gradually Appeared Wound Status: Healed - Epithelialized Date Acquired: 02/11/2020 Weeks Of Treatment: 7 Clustered Wound: No Photos Photo Uploaded By: Mikeal Hawthorne on 04/01/2020 10:35:04 Wound Measurements Length: (cm) Width: (cm) Depth: (cm) Area: (cm) Volume: (cm) 0 % Reduction in Area: 100% 0 % Reduction in Volume: 100% 0 0 0 Wound Description Classification: Full Thickness Without Exposed Support Structur es Electronic Signature(s)  Signed: 03/31/2020 5:18:32 PM By: Deon Pilling Signed: 04/01/2020 5:17:28 PM By: Levan Hurst RN, BSN Entered By: Deon Pilling on 03/31/2020 07:56:05 -------------------------------------------------------------------------------- Wound Assessment Details Patient Name: Date of Service: [place], [name] [name] 03/31/2020 7:30 [name] M Medical Record Number: [id number] Patient Account Number: [id number] Date of Birth/Sex: Treating RN: June 21, 1986 (34 y.o. [name] Primary Care Triston Skare: [name], [name] Other Clinician: Referring Tal Neer: Treating Keyari Kleeman/Extender: Dutch Gray, [name]: 221 Wound Status Wound Number: 41 Primary Etiology: Pressure Ulcer Wound Location: Left Ischium Wound Status: Open Wounding Event: Gradually Appeared Comorbid  History: Sleep Apnea, Hypertension, Paraplegia Date Acquired: 03/16/2020 Weeks Of Treatment: 2 Clustered Wound: No Photos Photo Uploaded By: Mikeal Hawthorne on 04/01/2020 10:35:04 Wound Measurements Length: (cm) 0.5 Width: (cm) 0.2 Depth: (cm) 0.1 Area: (cm) 0.079 Volume: (cm) 0.008 % Reduction in Area: 95.6% % Reduction in Volume: 95.6% Epithelialization: Large (67-100%) Tunneling: No Undermining: No Wound Description Classification: Category/Stage II Wound Margin: Flat and Intact Exudate Amount: Medium Exudate Type: Serosanguineous Exudate Color: red, brown Foul Odor After Cleansing: No Slough/Fibrino No Wound Bed Granulation Amount: Large (67-100%) Exposed Structure Granulation Quality: Red Fascia Exposed: No Necrotic Amount: None Present (0%) Fat Layer (Subcutaneous Tissue) Exposed: Yes Tendon Exposed: No Muscle Exposed: No Joint Exposed: No Bone Exposed: No Treatment Notes 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 Cleanse With Wound Cleanser 3. Primary Dressing Applied Calcium Alginate Ag 4. Secondary Dressing Foam Border Dressing 5. Secured With Office manager) Signed: 03/31/2020 5:18:32 PM By: Deon Pilling Signed: 04/01/2020 5:17:28 PM By: Levan Hurst RN, BSN Entered By: Deon Pilling on 03/31/2020 07:57:24 -------------------------------------------------------------------------------- Vitals Details Patient Name: Date of Service: [place], [name] [name] 03/31/2020 7:30 [name] M Medical Record Number: [id number] Patient Account Number: [id number] Date of Birth/Sex: Treating RN: 09-19-1986 (34 y.o. [name] Primary Care Traveon Louro: [place], [place] Other Clinician: Referring Johnanthony Wilden: Treating Aaleeyah Bias/Extender: [name]: 221 Vital Signs Time Taken: 07:47 Temperature (F): 97.9 Height (in): 70 Pulse (bpm): 98 Weight (lbs): 216 Respiratory Rate (breaths/min): 18 Body Mass Index (BMI): 31 Blood  Pressure (mmHg): 119/76 Reference Range: 80 - 120 mg / dl Electronic Signature(s) Signed: 04/01/2020 8:52:48 AM By: Sandre Kitty Entered By: Sandre Kitty on 03/31/2020 07:47:41

## 2020-04-07 ENCOUNTER — Encounter (HOSPITAL_BASED_OUTPATIENT_CLINIC_OR_DEPARTMENT_OTHER): Payer: BC Managed Care – PPO | Admitting: Internal Medicine

## 2020-04-07 DIAGNOSIS — L97521 Non-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: Secondary | ICD-10-CM | POA: Diagnosis not present

## 2020-04-07 DIAGNOSIS — Z87891 Personal history of nicotine dependence: Secondary | ICD-10-CM | POA: Diagnosis not present

## 2020-04-07 DIAGNOSIS — G8221 Paraplegia, complete: Secondary | ICD-10-CM | POA: Diagnosis not present

## 2020-04-07 DIAGNOSIS — L89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, stage 3: Secondary | ICD-10-CM | POA: Diagnosis not present

## 2020-04-07 DIAGNOSIS — L97321 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 to breakdown of skin: Secondary | ICD-10-CM | POA: Diagnosis not present

## 2020-04-07 DIAGNOSIS — L97511 Non-pressure chronic ulcer of other part of right foot limited to breakdown of skin: Secondary | ICD-10-CM | POA: Diagnosis not present

## 2020-04-07 DIAGNOSIS — I87332 Chronic venous hypertension (idiopathic) with ulcer and inflammation of left lower extremity: Secondary | ICD-10-CM | POA: Diagnosis not present

## 2020-04-07 NOTE — Progress Notes (Signed)
Chestang, DONIS MCAULEY ([id number]) Visit Report for 04/07/2020 Arrival Information Details Patient Name: Date of Service: [name], [name] 04/07/2020 7:30 [name] Medical Record Number: [id number] Patient Account Number: [id number] Date of Birth/Sex: Treating RN: September 15, 1986 (34 y.o. [name] Primary Care Denissa Cozart: [name], [name] Other Clinician: Referring Nazanin Kinner: Treating Milo Solana/Extender: [name]: 56 Visit Information History Since Last Visit All ordered tests and consults were completed: No Patient Arrived: Wheel Chair Added or deleted any medications: No Arrival Time: 07:45 Any new allergies or adverse reactions: No Accompanied By: self Had [name] fall or experienced change in No Transfer Assistance: None activities of daily living that may affect Patient Identification Verified: Yes risk of falls: Secondary Verification Process Completed: Yes Signs or symptoms of abuse/neglect since last visito No Patient Requires Transmission-Based Precautions: No Hospitalized since last visit: No Patient Has Alerts: Yes Implantable device outside of the clinic excluding No Patient Alerts: R ABI = 1.0 cellular tissue based products placed in the center L ABI = 1.1 since last visit: Has Dressing in Place as Prescribed: Yes Has Compression in Place as Prescribed: Yes Pain Present Now: No Electronic Signature(s) Signed: 04/07/2020 5:02:28 PM By: Deon Pilling Entered By: Deon Pilling on 04/07/2020 07:45:51 -------------------------------------------------------------------------------- Compression Therapy Details Patient Name: Date of Service: [name], [name] 04/07/2020 7:30 [name] Medical Record Number: [id number] Patient Account Number: [id number] Date of Birth/Sex: Treating RN: [birth year] (34 y.o. [name] Primary Care Verina Galeno: [place], [place] Other Clinician: Referring Trudi Morgenthaler: Treating Aizley Stenseth/Extender: [name] in  Treatment: 222 Compression Therapy Performed for Wound Assessment: Wound #24 Left,Dorsal Foot Performed By: Clinician Levan Hurst, RN Compression Type: Four Layer Electronic Signature(s) Signed: 04/07/2020 5:02:28 PM By: Deon Pilling Entered By: Deon Pilling on 04/07/2020 07:57:45 -------------------------------------------------------------------------------- Compression Therapy Details Patient Name: Date of Service: [name], [name] 04/07/2020 7:30 [name] Medical Record Number: [id number] Patient Account Number: [id number] Date of Birth/Sex: Treating RN: 19-Jan-1986 (34 y.o. [name] Primary Care Sidharth Leverette: [place], [place] Other Clinician: Referring Skyy Mcknight: Treating Meng Winterton/Extender: [name]: 222 Compression Therapy Performed for Wound Assessment: Wound #37 Left,Lateral Malleolus Performed By: Clinician Levan Hurst, RN Compression Type: Four Layer Electronic Signature(s) Signed: 04/07/2020 5:02:28 PM By: Deon Pilling Entered By: Deon Pilling on 04/07/2020 07:57:45 -------------------------------------------------------------------------------- Encounter Discharge Information Details Patient Name: Date of Service: [name], [name] 04/07/2020 7:30 [name] Medical Record Number: [id number] Patient Account Number: [id number] Date of Birth/Sex: Treating RN: Apr 16, 1986 (34 y.o. [name] Primary Care Kooper Chriswell: [place], [place] Other Clinician: Referring Coralie Stanke: Treating Kaelei Wheeler/Extender: [name]: 222 Encounter Discharge Information Items Discharge Condition: Stable Ambulatory Status: Wheelchair Discharge Destination: Home Transportation: Private Auto Accompanied By: self Schedule Follow-up Appointment: Yes Clinical Summary of Care: Patient Declined Electronic Signature(s) Signed: 04/07/2020 5:02:28 PM By: Deon Pilling Entered By: Deon Pilling on 04/07/2020  08:01:14 -------------------------------------------------------------------------------- Patient/Caregiver Education Details Patient Name: Date of Service: [name], [name] 5/17/2021andnbsp7:30 [name] Medical Record Number: [id number] Patient Account Number: [id number] Date of Birth/Gender: Treating RN: Feb 28, 1986 (34 y.o. [name] Primary Care Physician: Janine Limbo Other Clinician: Referring Physician: Treating Physician/Extender: [name]: 69 Education Assessment Education Provided To: Patient Education Topics Provided Wound/Skin Impairment: Methods: Explain/Verbal Responses: State content correctly Electronic Signature(s) Signed: 04/07/2020 5:02:28 PM By: Deon Pilling Entered By: Deon Pilling on 04/07/2020 08:00:52 -------------------------------------------------------------------------------- Wound Assessment Details Patient Name: Date of Service: [name], [name]  04/07/2020 7:30 [name] Medical Record Number: [id number] Patient Account Number: [id number] Date of Birth/Sex: Treating RN: [birth year] (34 y.o. [name] Primary Care Alleta Avery: [name], [name] Other Clinician: Referring Diamond Martucci: Treating Arnell Mausolf/Extender: [name]: 222 Wound Status Wound Number: 24 Primary Etiology: Inflammatory Wound Location: Left, Dorsal Foot Wound Status: Open Wounding Event: Gradually Appeared Comorbid History: Sleep Apnea, Hypertension, Paraplegia Date Acquired: 03/08/2018 Weeks Of Treatment: 108 Clustered Wound: Yes Wound Measurements Length: (cm) 1.4 Width: (cm) 1.2 Depth: (cm) 0.1 Clustered Quantity: 1 Area: (cm) 1.319 Volume: (cm) 0.132 % Reduction in Area: -458.9% % Reduction in Volume: -450% Epithelialization: Large (67-100%) Tunneling: No Undermining: No Wound Description Classification: Full Thickness Without Exposed Support Structures Wound Margin: Flat and  Intact Exudate Amount: Medium Exudate Type: Serosanguineous Exudate Color: red, brown Foul Odor After Cleansing: No Slough/Fibrino No Wound Bed Granulation Amount: Large (67-100%) Exposed Structure Granulation Quality: Red Fascia Exposed: No Necrotic Amount: None Present (0%) Fat Layer (Subcutaneous Tissue) Exposed: Yes Tendon Exposed: No Muscle Exposed: No Joint Exposed: No Bone Exposed: No Treatment Notes Wound #24 (Left, Dorsal Foot) 1. Cleanse With Wound Cleanser Soap and water 3. Primary Dressing Applied Iodoflex 4. Secondary Dressing Dry Gauze 6. Support Layer Applied 4 layer compression wrap Electronic Signature(s) Signed: 04/07/2020 5:02:28 PM By: Deon Pilling Signed: 04/07/2020 5:54:52 PM By: Levan Hurst RN, BSN Entered By: Deon Pilling on 04/07/2020 07:53:49 -------------------------------------------------------------------------------- Wound Assessment Details Patient Name: Date of Service: [name], [name] 04/07/2020 7:30 [name] Medical Record Number: [id number] Patient Account Number: [id number] Date of Birth/Sex: Treating RN: [birth year] (34 y.o. [name] Primary Care Fia Hebert: [name], [name] Other Clinician: Referring Orion Mole: Treating Staton Markey/Extender: [name]: 222 Wound Status Wound Number: 37 Primary Etiology: Venous Leg Ulcer Wound Location: Left, Lateral Malleolus Wound Status: Open Wounding Event: Gradually Appeared Comorbid History: Sleep Apnea, Hypertension, Paraplegia Date Acquired: 11/26/2019 Weeks Of Treatment: 19 Clustered Wound: No Wound Measurements Length: (cm) 2.5 Width: (cm) 0.9 Depth: (cm) 0.1 Area: (cm) 1.767 Volume: (cm) 0.177 % Reduction in Area: 55.4% % Reduction in Volume: 55.3% Epithelialization: Large (67-100%) Tunneling: No Undermining: No Wound Description Classification: Full Thickness Without Exposed Support Structures Wound Margin: Flat and Intact Exudate  Amount: Medium Exudate Type: Serosanguineous Exudate Color: red, brown Foul Odor After Cleansing: No Slough/Fibrino No Wound Bed Granulation Amount: Large (67-100%) Exposed Structure Granulation Quality: Red, Friable Fascia Exposed: No Necrotic Amount: None Present (0%) Fat Layer (Subcutaneous Tissue) Exposed: Yes Tendon Exposed: No Muscle Exposed: No Joint Exposed: No Bone Exposed: No Treatment Notes Wound #37 (Left, Lateral Malleolus) 1. Cleanse With Wound Cleanser Soap and water 3. Primary Dressing Applied Hydrofera Blue 4. Secondary Dressing ABD Pad Dry Gauze 6. Support Layer Applied 4 layer compression wrap Electronic Signature(s) Signed: 04/07/2020 5:02:28 PM By: Deon Pilling Signed: 04/07/2020 5:54:52 PM By: Levan Hurst RN, BSN Entered By: Deon Pilling on 04/07/2020 07:54:04 -------------------------------------------------------------------------------- Wound Assessment Details Patient Name: Date of Service: [name], [name] 04/07/2020 7:30 [name] Medical Record Number: [id number] Patient Account Number: [id number] Date of Birth/Sex: Treating RN: [birth year] (34 y.o. [name] Primary Care Nai Dasch: [place], [place] Other Clinician: Referring Aladdin Kollmann: Treating Akshita Italiano/Extender: [name]: 222 Wound Status Wound Number: 38 Primary Etiology: Inflammatory Wound Location: Right T - Web between 1st and 2nd oe Wound Status: Open Wounding Event: Gradually Appeared Comorbid History: Sleep Apnea, Hypertension, Paraplegia Date Acquired: 11/30/2019 Weeks Of Treatment: 18 Clustered Wound: No Wound Measurements Length: (  cm) 0.4 Width: (cm) 0.1 Depth: (cm) 0.2 Area: (cm) 0.031 Volume: (cm) 0.006 % Reduction in Area: 90.6% % Reduction in Volume: 97.4% Tunneling: No Undermining: No Wound Description Classification: Full Thickness Without Exposed Support Structures Exudate Amount: Medium Exudate Type:  Serosanguineous Exudate Color: red, brown Foul Odor After Cleansing: No Slough/Fibrino Yes Wound Bed Granulation Amount: Medium (34-66%) Exposed Structure Granulation Quality: Red Fat Layer (Subcutaneous Tissue) Exposed: Yes Necrotic Amount: Medium (34-66%) Necrotic Quality: Adherent Slough Treatment Notes Wound #38 (Right Toe - Web between 1st and 2nd) 1. Cleanse With Wound Cleanser 3. Primary Dressing Applied Calcium Alginate Ag 4. Secondary Dressing Dry Gauze Roll Gauze 5. Secured With Recruitment consultant) Signed: 04/07/2020 5:02:28 PM By: Deon Pilling Signed: 04/07/2020 5:54:52 PM By: Levan Hurst RN, BSN Entered By: Deon Pilling on 04/07/2020 07:55:58 -------------------------------------------------------------------------------- Wound Assessment Details Patient Name: Date of Service: [name], [name] 04/07/2020 7:30 [name] Medical Record Number: [id number] Patient Account Number: [id number] Date of Birth/Sex: Treating RN: 07/15/1986 (34 y.o. [name] Primary Care Zeniah Briney: Other Clinician: Janine Limbo Referring Carrol Hougland: Treating Amee Boothe/Extender: [name]: 222 Wound Status Wound Number: 41 Primary Etiology: Pressure Ulcer Wound Location: Left Ischium Wound Status: Open Wounding Event: Gradually Appeared Comorbid History: Sleep Apnea, Hypertension, Paraplegia Date Acquired: 03/16/2020 Weeks Of Treatment: 3 Clustered Wound: No Wound Measurements Length: (cm) 0.5 Width: (cm) 0.2 Depth: (cm) 0.1 Area: (cm) 0.079 Volume: (cm) 0.008 % Reduction in Area: 95.6% % Reduction in Volume: 95.6% Tunneling: No Undermining: No Wound Description Classification: Category/Stage II Exudate Amount: Medium Exudate Type: Serosanguineous Exudate Color: red, brown Foul Odor After Cleansing: No Slough/Fibrino Yes Wound Bed Granulation Amount: Medium (34-66%) Exposed Structure Granulation Quality: Pink Fascia  Exposed: No Necrotic Amount: Medium (34-66%) Fat Layer (Subcutaneous Tissue) Exposed: Yes Necrotic Quality: Adherent Slough Tendon Exposed: No Muscle Exposed: No Joint Exposed: No Bone Exposed: No Treatment Notes 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 Cleanse With Wound Cleanser 3. Primary Dressing Applied Calcium Alginate Ag 4. Secondary Dressing Dry Gauze Foam Border Dressing Electronic Signature(s) Signed: 04/07/2020 5:02:28 PM By: Deon Pilling Signed: 04/07/2020 5:54:52 PM By: Levan Hurst RN, BSN Entered By: Deon Pilling on 04/07/2020 07:57:08 -------------------------------------------------------------------------------- [place] Details Patient Name: Date of Service: [name], [name] 04/07/2020 7:30 [name] Medical Record Number: [id number] Patient Account Number: [id number] Date of Birth/Sex: Treating RN: 15-Mar-1986 (34 y.o. [name] Primary Care Anayla Giannetti: [place], [place] Other Clinician: Referring Maudean Hoffmann: Treating Journe Hallmark/Extender: [name]: 222 Vital Signs Time Taken: 07:45 Temperature (F): 98 Height (in): 70 Pulse (bpm): 118 Weight (lbs): 216 Respiratory Rate (breaths/min): 20 Body Mass Index (BMI): 31 Blood Pressure (mmHg): 140/58 Reference Range: 80 - 120 mg / dl Electronic Signature(s) Signed: 04/07/2020 5:02:28 PM By: Deon Pilling Entered By: Deon Pilling on 04/07/2020 07:46:42

## 2020-04-07 NOTE — Progress Notes (Signed)
Bickhart, MOSI MONTREUIL ([id number]) Visit Report for 04/07/2020 SuperBill Details Patient Name: Date of Service: [name], [name] 04/07/2020 Medical Record Number: [id number] Patient Account Number: [id number] Date of Birth/Sex: Treating RN: 10-Apr-1986 (34 y.o. 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

## 2020-04-14 ENCOUNTER — Encounter (HOSPITAL_BASED_OUTPATIENT_CLINIC_OR_DEPARTMENT_OTHER): Payer: BC Managed Care – PPO | Admitting: Internal Medicine

## 2020-04-14 DIAGNOSIS — I87332 Chronic venous hypertension (idiopathic) with ulcer and inflammation of left lower extremity: Secondary | ICD-10-CM | POA: Diagnosis not present

## 2020-04-14 DIAGNOSIS — Z87891 Personal history of nicotine dependence: Secondary | ICD-10-CM | POA: Diagnosis not present

## 2020-04-14 DIAGNOSIS — L97511 Non-pressure chronic ulcer of other part of right foot limited to breakdown of skin: Secondary | ICD-10-CM | POA: Diagnosis not present

## 2020-04-14 DIAGNOSIS — L97321 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 to breakdown of skin: Secondary | ICD-10-CM | POA: Diagnosis not present

## 2020-04-14 DIAGNOSIS — G8221 Paraplegia, complete: Secondary | ICD-10-CM | POA: Diagnosis not present

## 2020-04-14 DIAGNOSIS — L97521 Non-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: Secondary | ICD-10-CM | POA: Diagnosis not present

## 2020-04-14 DIAGNOSIS — L89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, stage 3: Secondary | ICD-10-CM | POA: Diagnosis not present

## 2020-04-17 NOTE — Progress Notes (Signed)
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 him aggressively for this although we have not made any improvement here. 08/11/17; left lateral leg. Surface continues to improve with the Iodoflex although we are not seeing much improvement in overall wound dimensions. Areas on his plantar left foot and right foot show no improvement. In fact the right foot looks somewhat worse 08/18/17; left lateral leg. We changed to [facility] Blue last week after a prolonged course of Iodoflex which helps get the surface better. It appears that the wound with is improved. Continue with difficult areas on the left dorsal first second and plantar first second on the right  09/01/17; patient arrives in clinic today having had a temperature of 103 yesterday. He was seen in the ER and [facility]. The patient was concerned he could have cellulitis again in the right leg however they diagnosed him with a UTI and he is now on Keflex. He has a history of cellulitis which is been recurrent and difficult but this is been in the left leg, in the past 5 use doxycycline. He does in and out catheterizations at home which are risk factors for UTI 09/08/17; patient will be completing his Keflex this weekend. The erythema on the left leg is considerably better. He has a new  wound today on the medial part of the right leg small superficial almost looks like a skin tear. He has worsening of the area on the right dorsal first and second toe. His major area on the left lateral leg is better. Using Hydrofera Blue on all areas 09/15/17; gradual reduction in width on the long wound in the left lateral leg. No debridement required. He also has wounds on the plantar aspect of his left first second toe web space and on the dorsal aspect of the right first second toe web space. 09/22/17; there continues to be very gradual improvements in the dimensions of the left lateral leg wound. He hasn't round erythematous spot with might be pressure on his wheelchair. There is no evidence obviously of infection no purulence no warmth He has a dry scaled area on the plantar aspect of the left first second toe Improved area on the dorsal right first second toe. 09/29/17; left lateral leg wound continues to improve in dimensions mostly with an is still a fairly long but increasingly narrow wound. He has a dry scaled area on the plantar aspect of his left first second toe web space Increasingly concerning area on the dorsal right first second toe. In fact I am concerned today about possible cellulitis around this wound. The areas extending up his second toe and although there is deformities here almost appears to abut on the nailbed. 10/06/17; left lateral leg wound continues to make very gradual progress. Tissue culture I did from the right first second toe dorsal foot last time grew MRSA and enterococcus which was vancomycin sensitive. This was not sensitive to clindamycin or doxycycline. He is allergic to Zyvox and sulfa we have therefore arrange for him to have dalvance infusion tomorrow. He is had this in the past and tolerated it well 10/20/17; left lateral leg wound continues to make decent progress. This is certainly reduced in terms of with there is advancing epithelialization.The  cellulitis in the right foot looks better although he still has a deep wound in the dorsal aspect of the first second toe web space. Plantar left first toe web space on the left I think is making some progress 10/27/17; left lateral leg wound continues to make decent progress. Advancing epithelialization.using Hydrofera Blue The right first second toe web space wound is better-looking using silver alginate Improvement in the left plantar first second toe web space. Again using silver alginate 11/03/17 left lateral leg wound continues to make decent progress albeit slowly. Using [facility] The right per second toe web space continues to be a very problematic looking punched out wound. I obtained a piece of tissue for deep culture I did extensively treated this for fungus. It is difficult to imagine that this is a pressure area as the patient states other than going outside he doesn't really wear shoes at home The  left plantar first second toe web space looked fairly senescent. Necrotic edges. This required debridement change to [facility] Blue to all wound areas 11/10/17; left lateral leg wound continues to contract. Using Hydrofera Blue On the right dorsal first second toe web space dorsally. Culture I did of this area last week grew MRSA there is not an easy oral option in this patient was multiple antibiotic allergies or intolerances. This was only a rare culture isolate I'm therefore going to use Bactroban under silver alginate On the left plantar first second toe web space. Debridement is required here. This is also unchanged 11/17/17; left lateral leg wound continues to contract using Hydrofera Blue this is no longer the major issue. The major concern here is the right first second toe web space. He now has an open area going from dorsally to the plantar aspect. There is now wound on the inner lateral part of the first toe. Not a very viable surface on this. There is erythema spreading  medially into the forefoot. No major change in the left first second toe plantar wound 11/24/17; left lateral leg wound continues to contract using Hydrofera Blue. Nice improvement today The right first second toe web space all of this looks a lot less angry than last week. I have given him clindamycin and topical Bactroban for MRSA and terbinafine for the possibility of underlining tinea pedis that I could not control with ketoconazole. Looks somewhat better The area on the plantar left first second toe web space is weeping with dried debris around the wound 12/01/17; left lateral leg wound continues to contract he Hydrofera Blue. It is becoming thinner in terms of with nevertheless it is making good improvement. The right first second toe web space looks less angry but still a large necrotic-looking wounds starting on the plantar aspect of the right foot extending between the toes and now extensively on the base of the right second toe. I gave him clindamycin and topical Bactroban for MRSA anterior benefiting for the possibility of underlying tinea pedis. Not looking better today The area on the left first/second toe looks better. Debrided of necrotic debris 12/05/17* the patient was worked in urgently today because over the weekend he found blood on his incontinence bad when he woke up. He was found to have an ulcer by his wife who does most of his wound care. He came in today for [place] to look at this. He has not had a history of wounds in his buttocks in spite of his paraplegia. 12/08/17; seen in follow-up today at his usual appointment. He was seen earlier this week and found to have a new wound on his buttock. We also follow him for wounds on the left lateral leg, left first second toe web space and right first second toe web space 12/15/17; we have been using Hydrofera Blue to the left lateral leg which has improved. The right first second toe web space has also improved. Left first second toe web  space plantar aspect looks stable. The left buttock has worsened using Santyl. Apparently the buttock has drainage 12/22/17; we have been using Hydrofera Blue to the left lateral leg which continues to improve now 2 small wounds separated by normal skin. He tells [place] he had a fever up to 100 yesterday he is prone to UTIs but has not noted anything different. He does in and out catheterizations. The area between the first and second toes today does not look good necrotic surface covered with what looks  to be purulent drainage and erythema extending into the third toe. I had gotten this to something that I thought look better last time however it is not look good today. He also has a necrotic surface over the buttock wound which is expanded. I thought there might be infection under here so I removed a lot of the surface with a #5 curet though nothing look like it really needed culturing. He is been using Santyl to this area 12/27/17; his original wound on the left lateral leg continues to improve using Hydrofera Blue. I gave him samples of Baxdella although he was unable to take them out of fear for an allergic reaction ["lump in his throat"].the culture I did of the purulent drainage from his second toe last week showed both enterococcus and a set Enterobacter I was also concerned about the erythema on the bottom of his foot although paradoxically although this looks somewhat better today. 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 We've been using silver alginate here. They came up today that he sleeps in a recliner, I'm not sure why but I asked him to stop this 01/03/18; his original wound we've been using Hydrofera Blue is now separated into 2 areas. Ulcer on his left buttock is better he is off the recliner and sleeping in bed Finally both wound areas between his first and second toes also looks some  better 01/10/18; his original wound on the left lateral leg is now separated into 2 wounds we've been using Hydrofera Blue Ulcer on his left buttock has some drainage. There is a small probing site going into muscle layer superiorly.using silver alginate -He arrives today with a deep tissue injury on the left heel The wound on the dorsal aspect of his first second toe on the left looks a lot betterusing silver alginate ketoconazole The area on the first second toe web space on the right also looks a lot bette 01/17/18; his original wound on the left lateral leg continues to progress using Hydrofera Blue Ulcer on his left buttock also is smaller surface healthier except for a small probing site going into the muscle layer superiorly. 2.4 cm of tunneling in this area DTI on his left heel we have only been offloading. Looks better than last week no threatened open no evidence of infection the wound on the dorsal aspect of the first second toe on the left continues to look like it's regressing we have only been using silver alginate and terbinafine orally The area in the first second toe web space on the right also looks to be a lot better using silver alginate and terbinafine I think this was prompted by tinea pedis 01/31/18; the patient was hospitalized in [place] last week apparently for a complicated UTI. He was discharged on cefepime he does in and out catheterizations. In the hospital he was discovered M I don't mild elevation of AST and ALT and the terbinafine was stopped.predictably the pressure ulcer on s his buttock looks betterusing silver alginate. The area on the left lateral leg also is better using Hydrofera Blue. The area between the first and second toes on the left better. First and second toes on the right still substantial but better. Finally the DTI on the left heel has held together and looks like it's resolving 02/07/18-he is here in follow-up evaluation for multiple ulcerations.  He has new injury to the lateral aspect of  the last issue a pressure ulcer, he states this is from adhesive removal trauma. He states he has tried multiple adhesive products with no success. All other ulcers appear stable. The left heel DTI is resolving. We will continue with same treatment plan and follow-up next week. 02/14/18; follow-up for multiple areas. He has a new area last week on the lateral aspect of his pressure ulcer more over the posterior trochanter. The original pressure ulcer looks quite stable has healthy granulation. 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 Almost fully epithelialized on the original superior area using Hydrofera Blue DTI on the left heel has peeled off this week to reveal a small superficial wound under denuded skin and subcutaneous tissue Both areas between the first and second toes look better including nothing open on the left 02/21/18; The patient's wounds on his left ischial tuberosity and posterior left greater trochanter actually looked better. He has a large area of irritation around the area which I think is contact dermatitis. I am doubtful that this is fungal His original wound on the left lateral calf continues to improve we have been using Hydrofera Blue There is no open area in the left first second toe web space although there is a lot of thick callus The DTI on the left heel required debridement today of necrotic surface eschar and subcutaneous tissue using silver alginate Finally the area on the right first second toe webspace continues to contract using silver alginate and ketoconazole 02/28/18 Left ischial tuberosity wounds look better using silver alginate. Original wound on the left calf only has one small open area left using Hydrofera Blue DTI on the left heel required debridement mostly removing skin from around this wound surface. Using  silver alginate The areas on the right first/second toe web space using silver alginate and ketoconazole 03/08/18 on evaluation today patient appears to be doing decently well as best I can tell in regard to his wounds. This is the first time that I have seen him as he generally is followed by Dr. Dellia Nims. With that being said none of his wounds appear to be infected he does have an area where there is some skin covering what appears to be a new wound on the left dorsal surface of his great toe. This is right at the nail bed. With that being said I do believe that debrided away some of the excess skin can be of benefit in this regard. Otherwise he has been tolerating the dressing changes without complication. 03/14/18; patient arrives today with the multiplicity of wounds that we are following. He has not been systemically unwell Original wound on the left lateral calf now only has 2 small open areas we've been using Hydrofera Blue which should continue The deep tissue injury on the left heel requires debridement today. We've been using silver alginate The left first second toe and the right first second toe are both are reminiscence what I think was tinea pedis. Apparently some of the callus Surface between the toes was removed last week when it started draining. Purulent drainage coming from the wound on the ischial tuberosity on the left. 03/21/18-He is here in follow-up evaluation for multiple wounds. 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 He tolerated debridement. The only change to last week's recommendations is to discontinue antifungal cream between toes. He will follow-up next week  03/28/18; following up for multiple wounds;Concern this week is streaking redness and swelling in the right foot. He is going to need antibiotics for this. 03/31/18; follow-up for right foot cellulitis. Streaking redness and swelling in the  right foot on 03/28/18. 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 I put him on clindamycin 300 mg every 6 and brought him in for a quick check. He has an open wound between his first and second toes on the right foot as a potential source. 04/04/18; Right foot cellulitis is resolving he is completing clindamycin. This is truly good news Left lateral calf wound which is initial wound only has one small open area inferiorly this is close to healing out. He has compression stockings. We will use Hydrofera Blue right down to the epithelialization of this Nonviable surface on the left heel which was initially pressure with a DTI. We've been using Hydrofera Blue. I'm going to switch this back to silver alginate Left first second toe/tinea pedis this looks better using silver alginate Right first second toe tinea pedis using silver alginate Large pressure ulcers on theLeft ischial tuberosity. Small wound here Looks better. I am uncertain about the surface over the large wound. Using silver alginate 04/11/18; Cellulitis in the right foot is resolved Left lateral calf wound which was his original wounds still has 2 tiny open areas remaining this is just about closed Nonviable surface on the left heel is better but still requires debridement Left first second toe/tinea pedis still open using silver alginate Right first second toe wound tinea pedis I asked him to go back to using ketoconazole and silver alginate Large pressure ulcers on the left ischial tuberosity this shear injury here is resolved. Wound is smaller. No evidence of infection using silver alginate 04/18/18; Patient arrives with an intense area of cellulitis in the right mid lower calf extending into the right heel area. Bright red and warm. Smaller area on the left anterior leg. He has a significant history of MRSA. He will definitely need antibioticsdoxycycline He now has 2 open areas on the left ischial tuberosity the  original large wound and now a satellite area which I think was above his initial satellite areas. Not a wonderful surface on this satellite area surrounding erythema which looks like pressure related. His left lateral calf wound again his original wound is just about closed Left heel pressure injury still requiring debridement Left first second toe looks a lot better using silver alginate Right first second toe also using silver alginate and ketoconazole cream also looks better 04/20/18; the patient was worked in early today out of concerns with his cellulitis on the right leg. I had started him on doxycycline. This was 2 days ago. His wife was concerned about the swelling in the area. Also concerned about the left buttock. He has not been systemically unwell no fever chills. No nausea vomiting or diarrhea 04/25/18; the patient's left buttock wound is continued to deteriorate he is using Hydrofera Blue. He is still completing clindamycin for the cellulitis on the right leg although all of this looks better. 05/02/18 Left buttock wound still with a lot of drainage and a very tightly adherent fibrinous necrotic surface. He has a deeper area superiorly The left lateral calf wound is still closed DTI wound on the left heel necrotic surface especially the circumference using Iodoflex Areas between his left first second toe and right first second toe both look better. Dorsally and the right first second toe he  had a necrotic surface although at smaller. In using silver alginate and ketoconazole. I did a culture last week which was a deep tissue culture of the reminiscence of the open wound on the right first second toe dorsally. This grew a few Acinetobacter and a few methicillin-resistant staph aureus. Nevertheless the area actually this week looked better. I didn't feel the need to specifically address this at least in terms of systemic antibiotics. 05/09/18; wounds are measuring larger more drainage per  our intake. We are using Santyl covered with alginate on the large superficial buttock wounds, Iodosorb on the left heel, ketoconazole and silver alginate to the dorsal first and second toes bilaterally. 05/16/18; The area on his left buttock better in some aspects although the area superiorly over the ischial tuberosity required an extensive debridement.using Santyl Left heel appears stable. Using Iodoflex The areas between his first and second toes are not bad however there is spreading erythema up the dorsal aspect of his left foot this looks like cellulitis again. He is insensate the erythema is really very brilliant.o Erysipelas He went to see an allergist days ago because he was itching part of this he had lab work done. This showed a white count of 15.1 with 70% neutrophils. Hemoglobin of 11.4 and a platelet count of 659,000. Last white count we had in Epic was a 2-1/2 years ago which was 25.9 but he was ill at the time. He was able to show me some lab work that was done by his primary physician the pattern is about the same. 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 But prompted me to go ahead and do x-rays of both feet and the pelvis rule out osteomyelitis. He also had a comprehensive metabolic panel this was reasonably normal his albumin was 3.7 liver function tests BUN/creatinine all normal 05/23/18; x-rays of both his feet from last week were negative for underlying pulmonary abnormality. The x-ray of his pelvis however showed mild irregularity in the left ischial which may represent some early osteomyelitis. The wound in the left ischial continues to get deeper clearly now exposed muscle. Each week necrotic surface material over this area. Whereas the rest of the wounds do not look so bad. The left ischial wound we have been using Santyl and calcium alginate T the left heel surface necrotic debris using Iodoflex o The left lateral leg is still  healed Areas on the left dorsal foot and the right dorsal foot are about the same. There is some inflammation on the left which might represent contact dermatitis, fungal dermatitis I am doubtful cellulitis although this looks better than last week 05/30/18; CT scan done at Hospital did not show any osteomyelitis or abscess. Suggested the possibility of underlying cellulitis although I don't see a lot of evidence of this at the bedside The wound itself on the left buttock/upper thigh actually looks somewhat better. No debridement Left heel also looks better no debridement continue Iodoflex Both dorsal first second toe spaces appear better using Lotrisone. Left still required debridement 06/06/18; Intake reported some purulent looking drainage from the left gluteal wound. Using Santyl and calcium alginate Left heel looks better although still a nonviable surface requiring debridement The left dorsal foot first/second webspace actually expanding and somewhat deeper. I may consider doing a shave biopsy of this area Right dorsal foot first/second webspace appears stable to improved. Using Lotrisone and silver alginate to both these areas 06/13/18 Left gluteal surface looks  better. Now separated in the 2 wounds. No debridement required. Still drainage. We'll continue silver alginate Left heel continues to look better with Iodoflex continue this for at least another week Of his dorsal foot wounds the area on the left still has some depth although it looks better than last week. We've been using Lotrisone and silver alginate 06/20/18 Left gluteal continues to look better healthy tissue Left heel continues to look better healthy granulation wound is smaller. He is using Iodoflex and his long as this continues continue the Iodoflex Dorsal right foot looks better unfortunately dorsal left foot does not. There is swelling and erythema of his forefoot. He had minor trauma to this several days ago but doesn't  think this was enough to have caused any tissue injury. Foot looks like cellulitis, we have had this problem before 06/27/18 on evaluation today patient appears to be doing a little worse in regard to his foot ulcer. 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 Both of which would really be his only options for treating this infection. In the past he has been given and effusion of Orbactiv. This is done very well for him in the past again it's one time dosing IV antibiotic therapy. Subsequently I do believe this is something we're gonna need to see about doing at this point in time. Currently his other wounds seem to be doing somewhat better in my pinion I'm pretty happy in that regard. 07/03/18 on evaluation today patient's wounds actually appear to be doing fairly well. He has been tolerating the dressing changes without complication. All in all he seems to be showing signs of improvement. In regard to the antibiotics he has been dealing with infectious disease since I saw him last week as far as getting this scheduled. In the end [facility]'s [facility] this coming Friday. In the meantime he has been continuing to perform the dressing changes in such as previous. There does not appear to be any evidence of infection worsengin at this time. 07/10/18; Since I last saw this man 2 weeks ago things have actually improved. IV antibiotics of resulted in less forefoot erythema although there is still some present. He is not systemically unwell Left buttock wounds 2 now have no depth there is increased epithelialization Using silver alginate Left heel still requires debridement using Iodoflex Left dorsal foot still with a sizable wound about the size of a border but healthy granulation Right dorsal foot still with a  slitlike area using silver alginate 07/18/18; the patient's cellulitis in the left foot is improved in fact I think it is on its way to resolving. Left buttock wounds 2 both look better although the larger one has hypertension granulation we've been using silver alginate Left heel has some thick circumferential redundant skin over the wound edge which will need to be removed today we've been using Iodoflex Left dorsal foot is still a sizable wound required debridement using silver alginate The right dorsal foot is just about closed only a small open area remains here 07/25/18; left foot cellulitis is resolved Left buttock wounds 2 both look better. Hyper-granulation on the major area Left heel as some debris over the surface but otherwise looks a healthier wound. Using silver collagen Right dorsal foot is just about closed 07/31/18; arrives with  our intake nurse worried about purulent drainage from the buttock. We had hyper-granulation here last week His buttock wounds 2 continue to look better Left heel some debris over the surface but measuring smaller. Right dorsal foot unfortunately has openings between the toes Left foot superficial wound looks less aggravated. 08/07/18 Buttock wounds continue to look better although some of her granulation and the larger medial wound. silver alginate Left heel continues to look a lot better.silver collagen Left foot superficial wound looks less stable. Requires debridement. He has a new wound superficial area on the foot on the lateral dorsal foot. Right foot looks better using silver alginate without Lotrisone 08/14/2018; patient was in the ER last week diagnosed with a UTI. He is now on Cefpodoxime and Macrodantin. Buttock wounds continued to be smaller. Using silver alginate Left heel continues to look better using silver collagen Left foot superficial wound looks as though it is improving Right dorsal foot area is just about healed. 08/21/2018; patient is  completed his antibiotics for his UTI. He has 2 open areas on the buttocks. There is still not closed although the surface looks satisfactory. Using silver alginate Left heel continues to improve using silver collagen The bilateral dorsal foot areas which are at the base of his first and second toes/possible tinea pedis are actually stable on the left but worse on the right. The area on the left required debridement of necrotic surface. After debridement I obtained a specimen for PCR culture. The right dorsal foot which is been just about healed last week is now reopened 08/28/2018; culture done on the left dorsal foot showed coag negative staph both staph epidermidis and Lugdunensis. I think this is worthwhile initiating systemic treatment. I will use doxycycline given his long list of allergies. The area on the left heel slightly improved but still requiring debridement. The large wound on the buttock is just about closed whereas the smaller one is larger. Using silver alginate in this area 09/04/2018; patient is completing his doxycycline for the left foot although this continues to be a very difficult wound area with very adherent necrotic debris. We are using silver alginate to all his wounds right foot left foot and the small wounds on his buttock, silver collagen on the left heel. 09/11/2018; once again this patient has intense erythema and swelling of the left forefoot. Lesser degrees of erythema in the right foot. He has a long list of allergies and intolerances. I will reinstitute doxycycline. 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 Using silver alginate Left heel also looks better using silver collagen Unfortunately both the areas on his feet look worse. The area on the left first second webspace is now gone through to the plantar part of his foot. The area on the left foot anteriorly is irritated with erythema and swelling in the  forefoot. 09/25/2018 His wound on the left plantar heel looks better. Using silver collagen The area on the left buttock 2 small remnant areas. One is closed one is still open. Using silver alginate The areas between both his first and second toes look worse. This in spite of long-standing antifungal therapy with ketoconazole and silver alginate which should have antifungal activity He has small areas around his original wound on the left calf one is on the bottom of the original scar tissue and one superiorly both of these are small and superficial but again given wound history in this site this is  worrisome 10/02/2018 Left plantar heel continues to gradually contract using silver collagen Left buttock wound is unchanged using silver alginate The areas on his dorsal feet between his first and second toes bilaterally look about the same. I prescribed clindamycin ointment to see if we can address chronic staph colonization and also the underlying possibility of erythrasma The left lateral lower extremity wound is actually on the lateral part of his ankle. Small open area here. We have been using silver alginate 10/09/2018; Left plantar heel continues to look healthy and contract. No debridement is required Left buttock slightly smaller with a tape injury wound just below which was new this week Dorsal feet somewhat improved I have been using clindamycin Left lateral looks lower extremity the actual open area looks worse although a lot of this is epithelialized. I am going to change to silver collagen today He has a lot more swelling in the right leg although this is not pitting not red and not particularly warm there is a lot of spasm in the right leg usually indicative of people with paralysis of some underlying discomfort. We have reviewed his vascular status from 2017 he had a left greater saphenous vein ablation. I wonder about referring him back to vascular surgery if the area on the left  leg continues to deteriorate. 10/16/2018 in today for follow-up and management of multiple lower extremity ulcers. His left Buttock wound is much lower smaller and almost closed completely. The wound to the left ankle has began to reopen with Epithelialization and some adherent slough. He has multiple new areas to the left foot and leg. The left dorsal foot without much improvement. Wound present between left great webspace and 2nd toe. Erythema and edema present right leg. Right LE ultrasound obtained on 10/10/18 was negative for DVT . 10/23/2018; Left buttock is closed over. Still dry macerated skin but there is no open wound. I suspect this is chronic pressure/moisture Left lateral calf is quite a bit worse than when I saw this last. There is clearly drainage here he has macerated skin into the left plantar heel. We will change the primary dressing to alginate Left dorsal foot has some improvement in overall wound area. Still using clindamycin and silver alginate Right dorsal foot about the same as the left using clindamycin and silver alginate The erythema in the right leg has resolved. He is DVT rule out was negative Left heel pressure area required debridement although the wound is smaller and the surface is health 10/26/2018 The patient came back in for his nurse check today predominantly because of the drainage coming out of the left lateral leg with a recent reopening of his original wound on the left lateral calf. He comes in today with a large amount of surrounding erythema around the wound extending from the calf into the ankle and even in the area on the dorsal foot. He is not systemically unwell. He is not febrile. Nevertheless this looks like cellulitis. We have been using silver alginate to the area. I changed him to a regular visit and I am going to prescribe him doxycycline. The rationale here is a long list of medication intolerances and a history of MRSA. I did not see anything  that I thought would provide a valuable culture 10/30/2018 Follow-up from his appointment 4 days ago with really an extensive area of cellulitis in the left calf left lateral ankle and left dorsal foot. I put him on doxycycline. He has a long list of medication allergies  which are true allergy reactions. 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 In any case he is a lot better today. The erythema especially in the anterior and lateral left calf is better. He still has left ankle erythema. He also is complaining about increasing edema in the right leg we have only been using Kerlix Coban and he has been doing the wraps at home. Finally he has a spotty rash on the medial part of his upper left calf which looks like folliculitis or perhaps wrap occlusion type injury. Small superficial macules not pustules 11/06/18 patient arrives today with again a considerable degree of erythema around the wound on the left lateral calf extending into the dorsal ankle and dorsal foot. This is a lot worse than when I saw this last week. He is on doxycycline really with not a lot of improvement. He has not been systemically unwell Wounds on the; left heel actually looks improved. Original area on the left foot and proximity to the first and second toes looks about the same. He has superficial areas on the dorsal foot, anterior calf and then the reopening of his original wound on the left lateral calf which looks about the same The only area he has on the right is the dorsal webspace first and second which is smaller. He has a large area of dry erythematous skin on the left buttock small open area here. 11/13/2018; the patient arrives in much better condition. The erythema around the wound on the left lateral calf is a lot better. 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 In any case this is a lot better. The area on the left heel is very small and just about resolved using silver collagen we have been using silver alginate to the areas on his dorsal feet 11/20/2018; his wounds include the left lateral calf, left heel, dorsal aspects of both feet just proximal to the first second webspace. He is stable to slightly improved. I did not think any changes to his dressings were going to be necessary 11/27/2018 he has a reopening on the left buttock which is surrounded by what looks like tinea or perhaps some other form of dermatitis. The area on the left dorsal foot has some erythema around it I have marked this area but I am not sure whether this is cellulitis or not. Left heel is not closed. Left calf the reopening is really slightly longer and probably worse 1/13; in general things look better and smaller except for the left dorsal foot. Area on the left heel is just about closed, left buttock looks better only a small wound remains in the skin looks better [using Lotrisone] 1/20; the area on the left heel only has a few remaining open areas here. Left lateral calf about the same in terms of size, left dorsal foot slightly larger right lateral foot still not closed. The area on the left buttock has no open wound and the surrounding skin looks a lot better 1/27; the area on the left heel is closed. Left lateral calf better but still requiring extensive debridements. The area on his left buttock is closed. He still has the open areas on the left dorsal foot which is slightly smaller in the right foot which is slightly expanded. We have been using Iodoflex on these areas as well 2/3; left heel is  closed. Left lateral calf still requiring debridement using Iodoflex there is no open area on his left buttock however he has dry scaly skin over a large area of this. Not really responding well to the Lotrisone. Finally the areas on his dorsal feet at the level of the  first second webspace are slightly smaller on the right and about the same on the left. Both of these vigorously debrided with Anasept and gauze 2/10; left heel remains closed he has dry erythematous skin over the left buttock but there is no open wound here. Left lateral leg has come in and with. Still requiring debridement we have been using Iodoflex here. Finally the area on the left dorsal foot and right dorsal foot are really about the same extremely dry callused fissured areas. He does not yet have a dermatology appointment 2/17; left heel remains closed. He has a new open area on the left buttock. The area on the left lateral calf is bigger longer and still covered in necrotic debris. No major change in his foot areas bilaterally. I am awaiting for a dermatologist to look on this. We have been using ketoconazole I do not know that this is been doing any good at all. 2/24; left heel remains closed. The left buttock wound that was new reopening last week looks better. The left lateral calf appears better also although still requires debridement. The major area on his foot is the left first second also requiring debridement. We have been putting Prisma on all wounds. I do not believe that the ketoconazole has done too much good for his feet. He will use Lotrisone I am going to give him a 2-week course of terbinafine. We still do not have a dermatology appointment 3/2 left heel remains closed however there is skin over bone in this area I pointed this out to him today. The left buttock wound is epithelialized but still does not look completely stable. The area on the left leg required debridement were using silver collagen here. With regards to his feet we changed to Lotrisone last week and silver alginate. 3/9; left heel remains closed. Left buttock remains closed. The area on the right foot is essentially closed. The left foot remains unchanged. Slightly smaller on the left lateral calf. Using  silver collagen to both of these areas 3/16-Left heel remains closed. Area on right foot is closed. Left lateral calf above the lateral malleolus open wound requiring debridement with easy bleeding. Left dorsal wound proximal to first toe also debrided. Left ischial area open new. Patient has been using Prisma with wrapping every 3 days. Dermatology appointment is apparently tomorrow.Patient has completed his terbinafine 2-week course with some apparent improvement according to him, there is still flaking and dry skin in his foot on the left 3/23; area on the right foot is reopened. The area on the left anterior foot is about the same still a very necrotic adherent surface. He still has the area on the left leg and reopening is on the left buttock. He apparently saw dermatology although I do not have a note. According to the patient who is usually fairly well informed they did not have any good ideas. Put him on oral terbinafine which she is been on before. 3/30; using silver collagen to all wounds. Apparently his dermatologist put him on doxycycline and rifampin presumably some culture grew staph. I do not have this result. He remains on terbinafine although I have used terbinafine on him before 4/6; patient has had  a fairly substantial reopening on the right foot between the first and second toes. He is finished his terbinafine and I believe is on doxycycline and rifampin still as prescribed by dermatology. We have been using silver collagen to all his wounds although the patient reports that he thinks silver alginate does better on the wounds on his buttock. 4/13; the area on his left lateral calf about the same size but it did not require debridement. Left dorsal foot just proximal to the webspace between the first and second toes is about the same. Still nonviable surface. I note some superficial bronze discoloration of the dorsal part of his foot Right dorsal foot just proximal to the first  and second toes also looks about the same. I still think there may be the same discoloration I noted above on the left Left buttock wound looks about the same 4/20; left lateral calf appears to be gradually contracting using silver collagen. 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 The left dorsal foot wound is debrided of tightly adherent necrotic debris and really cleans up quite nicely. The right area is worse with expansion. I did not debride this it is now over the base of the second toe The area on his left buttock is smaller no debridement is required using silver collagen 5/4; left calf continues to make good progress. He arrives with erythema around the wounds on his dorsal foot which even extends to the plantar aspect. Very concerning for coexistent infection. He is finished the erythromycin I gave him for possible erythrasma this does not seem to have helped. The area on the left foot is about the same base of the dorsal toes Is area on the buttock looks improved on the left 5/11; left calf and left buttock continued to make good progress. Left foot is about the same to slightly improved. Major problem is on the right foot. He has not had an x-ray. Deep tissue culture I did last week showed both Enterobacter and E. coli. I did not change the doxycycline I put him on empirically although neither 1 of these were plated to doxycycline. He arrives today with the erythema looking worse on both the dorsal and plantar foot. Macerated skin on the bottom of the foot. he has not been systemically unwell 5/18-Patient returns at 1 week, left calf wound appears to be making some progress, left buttock wound appears slightly worse than last time, left foot wound looks slightly better, right foot redness is marginally better. X-ray of both feet show no air or evidence of osteomyelitis. Patient is finished his Omnicef and terbinafine. He  continues to have macerated skin on the bottom of the left foot as well as right 5/26; left calf wound is better, left buttock wound appears to have multiple small superficial open areas with surrounding macerated skin. X-rays that I did last time showed no evidence of osteomyelitis in either foot. He is finished cefdinir and doxycycline. I do not think that he was on terbinafine. He continues to have a large superficial open area on the right foot anterior dorsal and slightly between the first and second toes. I did send him to dermatology 2 months ago or so wondering about whether they would do a fungal scraping. I do not believe they did but did do a culture. We have been using silver alginate to the toe areas, he has been using antifungals at home topically either ketoconazole or Lotrisone. We are using silver  collagen on the left foot, silver alginate on the right, silver collagen on the left lateral leg and silver alginate on the left buttock 6/1; left buttock area is healed. We have the left dorsal foot, left lateral leg and right dorsal foot. We are using silver alginate to the areas on both feet and silver collagen to the area on his left lateral calf 6/8; the left buttock apparently reopened late last week. He is not really sure how this happened. He is tolerating the terbinafine. Using silver alginate to all wounds 6/15; left buttock wound is larger than last week but still superficial. Came in the clinic today with a report of purulence from the left lateral leg I did not identify any infection Both areas on his dorsal feet appear to be better. He is tolerating the terbinafine. Using silver alginate to all wounds 6/22; left buttock is about the same this week, left calf quite a bit better. His left foot is about the same however he comes in with erythema and warmth in the right forefoot once again. Culture that I gave him in the beginning of May showed Enterobacter and E. coli. I gave him  doxycycline and things seem to improve although neither 1 of these organisms was specifically plated. 6/29; left buttock is larger and dry this week. Left lateral calf looks to me to be improved. Left dorsal foot also somewhat improved right foot completely unchanged. The erythema on the right foot is still present. He is completing the Ceftin dinner that I gave him empirically [see discussion above.) 7/6 - All wounds look to be stable and perhaps improved, the left buttock wound is slightly smaller, per patient bleeds easily, completed ceftin, the right foot redness is less, he is on terbinafine 7/13; left buttock wound about the same perhaps slightly narrower. Area on the left lateral leg continues to narrow. Left dorsal foot slightly smaller right foot about the same. We are using silver alginate on the right foot and Hydrofera Blue to the areas on the left. Unna boot on the left 2 layer compression on the right 7/20; left buttock wound absolutely the same. Area on lateral leg continues to get better. Left dorsal foot require debridement as did the right no major change in the 7/27; left buttock wound the same size necrotic debris over the surface. The area on the lateral leg is closed once again. His left foot looks better right foot about the same although there is some involvement now of the posterior first second toe area. He is still on terbinafine which I have given him for a month, not certain a centimeter major change 06/25/19-All wounds appear to be slightly improved according to report, left buttock wound looks clean, both foot wounds have minimal to no debris the right dorsal foot has minimal slough. We are using Hydrofera Blue to the left and silver alginate to the right foot and ischial wound. 8/10-Wounds all appear to be around the same, the right forefoot distal part has some redness which was not there before, however the wound looks clean and small. Ischial wound looks about the  same with no changes 8/17; his wound on the left lateral calf which was his original chronic venous insufficiency wound remains closed. Since I last saw him the areas on the left dorsal foot right dorsal foot generally appear better but require debridement. The area on his left initial tuberosity appears somewhat larger to me perhaps hyper granulated and bleeds very easily. We have been  using Hydrofera Blue to the left dorsal foot and silver alginate to everything else 8/24; left lateral calf remains closed. The areas on his dorsal feet on the webspace of the first and second toes bilaterally both look better. The area on the left buttock which is the pressure ulcer stage II slightly smaller. I change the dressing to Hydrofera Blue to all areas 8/31; left lateral calf remains closed. The area on his dorsal feet bilaterally look better. Using Hydrofera Blue. Still requiring debridement on the left foot. No change in the left buttock pressure ulcers however 9/14; left lateral calf remains closed. Dorsal feet look quite a bit better than 2 weeks ago. Flaking dry skin also a lot better with the ammonium lactate I gave him 2 weeks ago. The area on the left buttock is improved. He states that his Roho cushion developed a leak and he is getting a new one, in the interim he is offloading this vigorously 9/21; left calf remains closed. Left heel which was a possible DTI looks better this week. He had macerated tissue around the left dorsal foot right foot looks satisfactory and improved left buttock wound. I changed his dressings to his feet to silver alginate bilaterally. Continuing Hydrofera Blue on the left buttock. 9/28 left calf remains closed. Left heel did not develop anything [possible DTI] dry flaking skin on the left dorsal foot. Right foot looks satisfactory. Improved left buttock wound. We are using silver alginate on his feet Hydrofera Blue on the buttock. I have asked him to go back to the  Lotrisone on his feet including the wounds and surrounding areas 10/5; left calf remains closed. The areas on the left and right feet about the same. A lot of this is epithelialized however debris over the remaining open areas. He is using Lotrisone and silver alginate. The area on the left buttock using Hydrofera Blue 10/26. Patient has been out for 3 weeks secondary to Covid concerns. He tested negative but I think his wife tested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 he is being planned for a venous ablation 03/25/16; aggressive debridement again of both wound areas on the left lateral leg. He is due for ablation surgery on May 22. He is much closer to being ready for an Apligraf. Has a new area between the left first and second toes 04/01/16 aggressive debridement done of both wounds. The new wound at the base of between his second and first toes looks stable 04/08/16; continued aggressive debridement of both wounds on the left lower leg. He goes for his venous ablation on Monday.  The new wound at the base of his first and second toes dorsally appears stable. 04/15/16; wounds aggressively debridement although the base of this looks considerably better Apligraf #1. He had ablation surgery on Monday I'll need to research these records. We only have approval for four Apligraf's 04/22/16; the patient is here for a wound check [Apligraf last week] intake nurse concerned about erythema around the wounds. Apparently a significant degree of drainage. The patient has chronic venous inflammation which I think accounts for most of this however I was asked to look at this today 04/26/16; the patient came back for check of possible cellulitis in his left foot however the Apligraf dressing was inadvertently removed therefore we elected to prep the wound for a second Apligraf. I put him on doxycycline on 6/1 the erythema in the foot 05/03/16 we did not remove the dressing from the superior wound as this is where I put all of his last Apligraf. Surface debridement done with a curette of the lower wound which looks very healthy. The area on the left foot also looks quite satisfactory at the dorsal artery at the first and second toes 05/10/16; continue Apligraf to this. Her wound, Hydrafera to the lower wound. He has a new area on the right second toe. Left dorsal foot firstoosecond toe also looks improved 05/24/16; wound dimensions must be smaller I was able to use Apligraf to all 3 remaining wound areas. 06/07/16 patient's last Apligraf was 2 weeks ago. He arrives today with the 2 wounds on his lateral left leg joined together. This would have to be seen as a negative. He also has a small wound in his first and second toe on the left dorsally with quite a bit of surrounding erythema in the first second and third toes. This looks to be infected or inflamed, very difficult clinical call. 06/21/16: lateral left leg combined wounds. Adherent surface slough area on the left dorsal foot at roughly the fourth  toe looks improved 07/12/16; he now has a single linear wound on the lateral left leg. This does not look to be a lot changed from when I lost saw this. The area on his dorsal left foot looks considerably better however.  08/02/16; no major change in the substantial area on his left lateral leg since last time. We have been using Hydrofera Blue for a prolonged period of time now. The area on his left foot is also unchanged from last review 07/19/16; the area on his dorsal foot on the left looks considerably smaller. He is beginning to have significant rims of epithelialization on the lateral left leg wound. This also looks better. 08/05/16; the patient came in for a nurse visit today. Apparently the area on his left lateral leg looks better and it was wrapped. However in general discussion the patient noted a new area on the dorsal aspect of his right second toe. The exact etiology of this is unclear but likely relates to pressure. 08/09/16 really the area on the left lateral leg did not really look that healthy today perhaps slightly larger and measurements. The area on his dorsal right second toe is improved also the left foot wound looks stable to improved 08/16/16; the area on the last lateral leg did not change any of dimensions. Post debridement with a curet the area looked better. Left foot wound improved and the area on the dorsal right second toe is improved 08/23/16; the area on the left lateral leg may be slightly smaller both in terms of length and width. Aggressive debridement with a curette afterwards the tissue appears healthier. Left foot wound appears improved in the area on the dorsal right second toe is improved 08/30/16 patient developed a fever over the weekend and was seen in an urgent care. Felt to have a UTI and put on doxycycline. He has been since changed over the phone to [facility]. After we took off the wrap on his right leg today the leg is swollen warm and erythematous, probably  more likely the source of the fever 09/06/16; have been using collagen to the major left leg wound, silver alginate to the area on his anterior foot/toes 09/13/16; the areas on his anterior foot/toes on both sides appear to be virtually closed. Extensive wound on the left lateral leg perhaps slightly narrower but each visit still covered an adherent surface slough 09/16/16 patient was in for his usual Thursday nurse visit however the intake nurse noted significant erythema of his dorsal right foot. He is also running a low- grade fever and having increasing spasms in the right leg 09/20/16 here for cellulitis involving his right great toes and forefoot. This is a lot better. Still requiring debridement on his left lateral leg. Santyl direct says he needs prior authorization. Therefore his wife cannot change this at home 09/30/16; the patient's extensive area on the left lateral calf and ankle perhaps somewhat better. Using Santyl. The area on the left toes is healed and I think the area on his right dorsal foot is healed as well. There is no cellulitis or venous inflammation involving the right leg. He is going to need compression stockings here. 10/07/16; the patient's extensive wound on the left lateral calf and ankle does not measure any differently however there appears to be less adherent surface slough using Santyl and aggressive weekly debridements 10/21/16; no major change in the area on the left lateral calf. Still the same measurement still very difficult to debridement adherent slough and nonviable subcutaneous tissue. This is not really been helped by several weeks of Santyl. Previously for 2 weeks I used Iodoflex for a short period. A prolonged course of Hydrofera Blue didn't really help. I'm not sure why I only used 2 weeks  of Iodoflex on this there is no evidence of surrounding infection. He has a small area on the right second toe which looks as though it's progressing towards  closure 10/28/16; the wounds on his toes appear to be closed. No major change in the left lateral leg wound although the surface looks somewhat better using Iodoflex. He has had previous arterial studies that were normal. He has had reflux studies and is status post ablation although I don't have any exact notes on which vein was ablated. I'll need to check the surgical record 11/04/16; he's had a reopening between the first and second toe on the left and right. No major change in the left lateral leg wound. There is what appears to be cellulitis of the left dorsal foot 11/18/16 the patient was hospitalized initially in [place] and then subsequently transferred to [facility] long and was admitted there from 11/09/16 through 11/12/16. He had developed progressive cellulitis on the right leg in spite of the doxycycline I gave him. I'd spoken to the hospitalist in [place] who was concerned about continuing leukocytosis. CT scan is what I suggested this was done which showed soft tissue swelling without evidence of osteomyelitis or an underlying abscess blood cultures were negative. At [facility] he was treated with vancomycin and Primaxin and then add an infectious disease consult. He was transitioned to Ceftaroline. He has been making progressive improvement. Overall a severe cellulitis of the right leg. He is been using silver alginate to her original wound on the left leg. The wounds in his toes on the right are closed there is a small open area on the base of the left second toe 11/26/15; the patient's right leg is much better although there is still some edema here this could be reminiscent from his severe cellulitis likely on top of some degree of lymphedema. His left anterior leg wound has less surface slough as reported by her intake nurse. Small wound at the base of the left second toe 12/02/16; patient's right leg is better and there is no open wound here. His left anterior lateral leg wound continues  to have a healthy-looking surface. Small wound at the base of the left second toe however there is erythema in the left forefoot which is worrisome 12/16/16; is no open wounds on his right leg. We took measurements for stockings. His left anterior lateral leg wound continues to have a healthy-looking surface. I'm not sure where we were with the Apligraf run through his insurance. We have been using Iodoflex. He has a thick eschar on the left first second toe interface, I suspect this may be fungal however there is no visible open 12/23/16; no open wound on his right leg. He has 2 small areas left of the linear wound that was remaining last week. We have been using Prisma, I thought I have disclosed this week, we can only look forward to next week 01/03/17; the patient had concerning areas of erythema last week, already on doxycycline for UTI through his primary doctor. The erythema is absolutely no better there is warmth and swelling both medially from the left lateral leg wound and also the dorsal left foot. 01/06/17- Patient is here for follow-up evaluation of his left lateral leg ulcer and bilateral feet ulcers. He is on oral antibiotic therapy, tolerating that. Nursing staff and the patient states that the erythema is improved from Monday. 01/13/17; the predominant left lateral leg wound continues to be problematic. I had put Apligraf on him earlier this  month once. However he subsequently developed what appeared to be an intense cellulitis around the left lateral leg wound. I gave him Dalvance I think on 2/12 perhaps 2/13 he continues on cefdinir. The erythema is still present but the warmth and swelling is improved. I am hopeful that the cellulitis part of this control. I wouldn't be surprised if there is an element of venous inflammation as well. 01/17/17. The erythema is present but better in the left leg. His left lateral leg wound still does not have a viable surface buttons certain parts of this  long thin wound it appears like there has been improvement in dimensions. 01/20/17; the erythema still present but much better in the left leg. I'm thinking this is his usual degree of chronic venous inflammation. The wound on the left leg looks somewhat better. Is less surface slough 01/27/17; erythema is back to the chronic venous inflammation. The wound on the left leg is somewhat better. I am back to the point where I like to try an Apligraf once again 02/10/17; slight improvement in wound dimensions. Apligraf #2. He is completing his doxycycline 02/14/17; patient arrives today having completed doxycycline last Thursday. This was supposed to be a nurse visit however once again he hasn't tense erythema from the medial part of his wound extending over the lower leg. Also erythema in his foot this is roughly in the same distribution as last time. He has baseline chronic venous inflammation however this is a lot worse than the baseline I have learned to accept the on him is baseline inflammation 02/24/17- patient is here for follow-up evaluation. He is tolerating compression therapy. His voicing no complaints or concerns he is here anticipating an Apligraf 03/03/17; he arrives today with an adherent necrotic surface. I don't think this is surface is going to be amenable for Apligraf's. The erythema around his wound and on the left dorsal foot has resolved he is off antibiotics 03/10/17; better-looking surface today. I don't think he can tolerate Apligraf's. He tells me he had a wound VAC after a skin graft years ago to this area and they had difficulty with a seal. The erythema continues to be stable around this some degree of chronic venous inflammation but he also has recurrent cellulitis. We have been using Iodoflex 03/17/17; continued improvement in the surface and may be small changes in dimensions. Using Iodoflex which seems the only thing that will control his surface 03/24/17- He is here for follow up  evaluation of his LLE lateral ulceration and ulcer to right dorsal foot/toe space. He is voicing no complaints or concerns, He is tolerating compression wrap. 03/31/17 arrives today with a much healthier looking wound on the left lower extremity. We have been using Iodoflex for a prolonged period of time which has for the first time prepared and adequate looking wound bed although we have not had much in the way of wound dimension improvement. He also has a small wound between the first and second toe on the right 04/07/17; arrives today with a healthy-looking wound bed and at least the top 50% of this wound appears to be now her. No debridement was required I have changed him to [facility] last week after prolonged Iodoflex. He did not do well with Apligraf's. We've had a re-opening between the first and second toe on the right 04/14/17; arrives today with a healthier looking wound bed contractions and the top 50% of this wound and some on the lesser 50%. Wound bed appears healthy.  The area between the first and second toe on the right still remains problematic 04/21/17; continued very gradual improvement. Using [facility] 04/28/17; continued very gradual improvement in the left lateral leg venous insufficiency wound. His periwound erythema is very mild. We have been using Hydrofera Blue. Wound is making progress especially in the superior 50% 05/05/17; he continues to have very gradual improvement in the left lateral venous insufficiency wound. Both in terms with an length rings are improving. I debrided this every 2 weeks with #5 curet and we have been using Hydrofera Blue and again making good progress With regards to the wounds between his right first and second toe which I thought might of been tinea pedis he is not making as much progress very dry scaly skin over the area. Also the area at the base of the left first and second toe in a similar condition 05/12/17; continued gradual improvement  in the refractory left lateral venous insufficiency wound on the left. Dimension smaller. Surface still requiring debridement using Hydrofera Blue 05/19/17; continued gradual improvement in the refractory left lateral venous ulceration. Careful inspection of the wound bed underlying rumination suggested some degree of epithelialization over the surface no debridement indicated. Continue Hydrofera Blue difficult areas between his toes first and third on the left than first and second on the right. I'm going to change to silver alginate from silver collagen. Continue ketoconazole as I suspect underlying tinea pedis 05/26/17; left lateral leg venous insufficiency wound. We've been using Hydrofera Blue. I believe that there is expanding epithelialization over the surface of the wound albeit not coming from the wound circumference. This is a bit of an odd situation in which the epithelialization seems to be coming from the surface of the wound rather than in the exact circumference. There is still small open areas mostly along the lateral margin of the wound. ooHe has unchanged areas between the left first and second and the right first second toes which I been treating for tenia pedis 06/02/17; left lateral leg venous insufficiency wound. We have been using Hydrofera Blue. Somewhat smaller from the wound circumference. The surface of the wound remains a bit on it almost epithelialized sedation in appearance. I use an open curette today debridement in the surface of all of this especially the edges ooSmall open wounds remaining on the dorsal right first and second toe interspace and the plantar left first second toe and her face on the left 06/09/17; wound on the left lateral leg continues to be smaller but very gradual and very dry surface using Hydrofera Blue 06/16/17 requires weekly debridements now on the left lateral leg although this continues to contract. I changed to silver collagen last week because of  dryness of the wound bed. Using Iodoflex to the areas on his first and second toes/web space bilaterally 06/24/17; patient with history of paraplegia also chronic venous insufficiency with lymphedema. Has a very difficult wound on the left lateral leg. This has been gradually reducing in terms of with but comes in with a very dry adherent surface. High switch to silver collagen a week or so ago with hydrogel to keep the area moist. This is been refractory to multiple dressing attempts. He also has areas in his first and second toes bilaterally in the anterior and posterior web space. I had been using Iodoflex here after a prolonged course of silver alginate with ketoconazole was ineffective [question tinea pedis] 07/14/17; patient arrives today with a very difficult adherent material over his left  lateral lower leg wound. He also has surrounding erythema and poorly controlled edema. He was switched his Santyl last visit which the nurses are applying once during his doctor visit and once on a nurse visit. He was also reduced to 2 layer compression I'm not exactly sure of the issue here. 07/21/17; better surface today after 1 week of Iodoflex. Significant cellulitis that we treated last week also better. [Doxycycline] 07/28/17 better surface today with now 2 weeks of Iodoflex. Significant cellulitis treated with doxycycline. He has now completed the doxycycline and he is back to his usual degree of chronic venous inflammation/stasis dermatitis. He reminds me he has had ablations surgery here 08/04/17; continued improvement with Iodoflex to the left lateral leg wound in terms of the surface of the wound although the dimensions are better. 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 Problematic areas on the plantar aspect of the first second toe web space on the left and the dorsal aspect of the first second toe web space on the right. At one  point I felt these were probably related to chronic fungal infections in treated him aggressively for this although we have not made any improvement here. 08/11/17; left lateral leg. Surface continues to improve with the Iodoflex although we are not seeing much improvement in overall wound dimensions. Areas on his plantar left foot and right foot show no improvement. In fact the right foot looks somewhat worse 08/18/17; left lateral leg. We changed to [facility] Blue last week after a prolonged course of Iodoflex which helps get the surface better. It appears that the wound with is improved. Continue with difficult areas on the left dorsal first second and plantar first second on the right 09/01/17; patient arrives in clinic today having had a temperature of 103 yesterday. He was seen in the ER and [facility]. The patient was concerned he could have cellulitis again in the right leg however they diagnosed him with a UTI and he is now on Keflex. He has a history of cellulitis which is been recurrent and difficult but this is been in the left leg, in the past 5 use doxycycline. He does in and out catheterizations at home which are risk factors for UTI 09/08/17; patient will be completing his Keflex this weekend. The erythema on the left leg is considerably better. He has a new wound today on the medial part of the right leg small superficial almost looks like a skin tear. He has worsening of the area on the right dorsal first and second toe. His major area on the left lateral leg is better. Using Hydrofera Blue on all areas 09/15/17; gradual reduction in width on the long wound in the left lateral leg. No debridement required. He also has wounds on the plantar aspect of his left first second toe web space and on the dorsal aspect of the right first second toe web space. 09/22/17; there continues to be very gradual improvements in the dimensions of the left lateral leg wound. He hasn't round erythematous spot  with might be pressure on his wheelchair. There is no evidence obviously of infection no purulence no warmth ooHe has a dry scaled area on the plantar aspect of the left first second toe ooImproved area on the dorsal right first second toe. 09/29/17; left lateral leg wound continues to improve in dimensions mostly with an is still a fairly long but increasingly narrow wound. ooHe has a dry scaled area  on the plantar aspect of his left first second toe web space ooIncreasingly concerning area on the dorsal right first second toe. In fact I am concerned today about possible cellulitis around this wound. The areas extending up his second toe and although there is deformities here almost appears to abut on the nailbed. 10/06/17; left lateral leg wound continues to make very gradual progress. Tissue culture I did from the right first second toe dorsal foot last time grew MRSA and enterococcus which was vancomycin sensitive. This was not sensitive to clindamycin or doxycycline. He is allergic to Zyvox and sulfa we have therefore arrange for him to have dalvance infusion tomorrow. He is had this in the past and tolerated it well 10/20/17; left lateral leg wound continues to make decent progress. This is certainly reduced in terms of with there is advancing epithelialization.ooThe cellulitis in the right foot looks better although he still has a deep wound in the dorsal aspect of the first second toe web space. Plantar left first toe web space on the left I think is making some progress 10/27/17; left lateral leg wound continues to make decent progress. Advancing epithelialization.using Hydrofera Blue ooThe right first second toe web space wound is better-looking using silver alginate ooImprovement in the left plantar first second toe web space. Again using silver alginate 11/03/17 left lateral leg wound continues to make decent progress albeit slowly. Using Hydrofera Blue ooThe right per second toe  web space continues to be a very problematic looking punched out wound. I obtained a piece of tissue for deep culture I did extensively treated this for fungus. It is difficult to imagine that this is a pressure area as the patient states other than going outside he doesn't really wear shoes at home ooThe left plantar first second toe web space looked fairly senescent. Necrotic edges. This required debridement oochange to Hydrofera Blue to all wound areas 11/10/17; left lateral leg wound continues to contract. Using Hydrofera Blue ooOn the right dorsal first second toe web space dorsally. Culture I did of this area last week grew MRSA there is not an easy oral option in this patient was multiple antibiotic allergies or intolerances. This was only a rare culture isolate I'm therefore going to use Bactroban under silver alginate ooOn the left plantar first second toe web space. Debridement is required here. This is also unchanged 11/17/17; left lateral leg wound continues to contract using Hydrofera Blue this is no longer the major issue. ooThe major concern here is the right first second toe web space. He now has an open area going from dorsally to the plantar aspect. There is now wound on the inner lateral part of the first toe. Not a very viable surface on this. There is erythema spreading medially into the forefoot. ooNo major change in the left first second toe plantar wound 11/24/17; left lateral leg wound continues to contract using Hydrofera Blue. Nice improvement today ooThe right first second toe web space all of this looks a lot less angry than last week. I have given him clindamycin and topical Bactroban for MRSA and terbinafine for the possibility of underlining tinea pedis that I could not control with ketoconazole. Looks somewhat better ooThe area on the plantar left first second toe web space is weeping with dried debris around the wound 12/01/17; left lateral leg wound continues  to contract he Hydrofera Blue. It is becoming thinner in terms of with nevertheless it is making good improvement. ooThe right first second toe web  space looks less angry but still a large necrotic-looking wounds starting on the plantar aspect of the right foot extending between the toes and now extensively on the base of the right second toe. I gave him clindamycin and topical Bactroban for MRSA anterior benefiting for the possibility of underlying tinea pedis. Not looking better today ooThe area on the left first/second toe looks better. Debrided of necrotic debris 12/05/17* the patient was worked in urgently today because over the weekend he found blood on his incontinence bad when he woke up. He was found to have an ulcer by his wife who does most of his wound care. He came in today for [place] to look at this. He has not had a history of wounds in his buttocks in spite of his paraplegia. 12/08/17; seen in follow-up today at his usual appointment. He was seen earlier this week and found to have a new wound on his buttock. We also follow him for wounds on the left lateral leg, left first second toe web space and right first second toe web space 12/15/17; we have been using Hydrofera Blue to the left lateral leg which has improved. The right first second toe web space has also improved. Left first second toe web space plantar aspect looks stable. The left buttock has worsened using Santyl. Apparently the buttock has drainage 12/22/17; we have been using Hydrofera Blue to the left lateral leg which continues to improve now 2 small wounds separated by normal skin. He tells [place] he had a fever up to 100 yesterday he is prone to UTIs but has not noted anything different. He does in and out catheterizations. The area between the first and second toes today does not look good necrotic surface covered with what looks to be purulent drainage and erythema extending into the third toe. I had gotten this to something  that I thought look better last time however it is not look good today. He also has a necrotic surface over the buttock wound which is expanded. I thought there might be infection under here so I removed a lot of the surface with a #5 curet though nothing look like it really needed culturing. He is been using Santyl to this area 12/27/17; his original wound on the left lateral leg continues to improve using Hydrofera Blue. I gave him samples of Baxdella although he was unable to take them out of fear for an allergic reaction ["lump in his throat"].the culture I did of the purulent drainage from his second toe last week showed both enterococcus and a set Enterobacter I was also concerned about the erythema on the bottom of his foot although paradoxically although this looks somewhat better today. 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 We've been using silver alginate here. They came up today that he sleeps in a recliner, I'm not sure why but I asked him to stop this 01/03/18; his original wound we've been using Hydrofera Blue is now separated into 2 areas. ooUlcer on his left buttock is better he is off the recliner and sleeping in bed ooFinally both wound areas between his first and second toes also looks some better 01/10/18; his original wound on the left lateral leg is now separated into 2 wounds we've been using Hydrofera Blue ooUlcer on his left buttock has some drainage. There is a small probing site going into muscle layer superiorly.using silver alginate -He arrives  today with a deep tissue injury on the left heel ooThe wound on the dorsal aspect of his first second toe on the left looks a lot betterusing silver alginate ketoconazole ooThe area on the first second toe web space on the right also looks a lot bette 01/17/18; his original wound on the left lateral leg continues to progress using  Hydrofera Blue ooUlcer on his left buttock also is smaller surface healthier except for a small probing site going into the muscle layer superiorly. 2.4 cm of tunneling in this area ooDTI on his left heel we have only been offloading. Looks better than last week no threatened open no evidence of infection oothe wound on the dorsal aspect of the first second toe on the left continues to look like it's regressing we have only been using silver alginate and terbinafine orally ooThe area in the first second toe web space on the right also looks to be a lot better using silver alginate and terbinafine I think this was prompted by tinea pedis 01/31/18; the patient was hospitalized in [place] last week apparently for a complicated UTI. He was discharged on cefepime he does in and out catheterizations. In the hospital he was discovered M I don't mild elevation of AST and ALT and the terbinafine was stopped.predictably the pressure ulcer on s his buttock looks betterusing silver alginate. The area on the left lateral leg also is better using Hydrofera Blue. The area between the first and second toes on the left better. First and second toes on the right still substantial but better. Finally the DTI on the left heel has held together and looks like it's resolving 02/07/18-he is here in follow-up evaluation for multiple ulcerations. He has new injury to the lateral aspect of the last issue a pressure ulcer, he states this is from adhesive removal trauma. He states he has tried multiple adhesive products with no success. All other ulcers appear stable. The left heel DTI is resolving. We will continue with same treatment plan and follow-up next week. 02/14/18; follow-up for multiple areas. ooHe has a new area last week on the lateral aspect of his pressure ulcer more over the posterior trochanter. The original pressure ulcer looks quite stable has healthy granulation. 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 Almost fully epithelialized on the original superior area using Hydrofera Blue ooDTI on the left heel has peeled off this week to reveal a small superficial wound under denuded skin and subcutaneous tissue ooBoth areas between the first and second toes look better including nothing open on the left 02/21/18; ooThe patient's wounds on his left ischial tuberosity and posterior left greater trochanter actually looked better. He has a large area of irritation around the area which I think is contact dermatitis. I am doubtful that this is fungal ooHis original wound on the left lateral calf continues to improve we have been using Hydrofera Blue ooThere is no open area in the left first second toe web space although there is a lot of thick callus ooThe DTI on the left heel required debridement today of necrotic surface eschar and subcutaneous tissue using silver alginate ooFinally the area on the right first second toe webspace continues to contract using silver alginate and ketoconazole 02/28/18 ooLeft ischial tuberosity wounds look better using silver alginate. ooOriginal wound on the left calf only has one small open area left using Hydrofera Blue ooDTI on  the left heel required debridement mostly removing skin from around this wound surface. Using silver alginate ooThe areas on the right first/second toe web space using silver alginate and ketoconazole 03/08/18 on evaluation today patient appears to be doing decently well as best I can tell in regard to his wounds. This is the first time that I have seen him as he generally is followed by Dr. Dellia Nims. With that being said none of his wounds appear to be infected he does have an area where there is some skin covering what appears to be a new wound on the left dorsal surface of his great toe. This is right at the nail bed. With that being  said I do believe that debrided away some of the excess skin can be of benefit in this regard. Otherwise he has been tolerating the dressing changes without complication. 03/14/18; patient arrives today with the multiplicity of wounds that we are following. He has not been systemically unwell ooOriginal wound on the left lateral calf now only has 2 small open areas we've been using Hydrofera Blue which should continue ooThe deep tissue injury on the left heel requires debridement today. We've been using silver alginate ooThe left first second toe and the right first second toe are both are reminiscence what I think was tinea pedis. Apparently some of the callus Surface between the toes was removed last week when it started draining. ooPurulent drainage coming from the wound on the ischial tuberosity on the left. 03/21/18-He is here in follow-up evaluation for multiple wounds. 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 He tolerated debridement. The only change to last week's recommendations is to discontinue antifungal cream between toes. He will follow-up next week 03/28/18; following up for multiple wounds;Concern this week is streaking redness and swelling in the right foot. He is going to need antibiotics for this. 03/31/18; follow-up for right foot cellulitis. Streaking redness and swelling in the right foot on 03/28/18. 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 I put him on clindamycin 300 mg every 6 and brought him in for a quick check. He has an open wound between his first and second toes on the right foot as a potential source. 04/04/18; ooRight foot cellulitis is resolving he is completing clindamycin. This is truly good news ooLeft lateral calf wound which is initial wound only has one small open area inferiorly this is close to healing out. He has compression stockings. We will use Hydrofera  Blue right down to the epithelialization of this ooNonviable surface on the left heel which was initially pressure with a DTI. We've been using Hydrofera Blue. I'm going to switch this back to silver alginate ooLeft first second toe/tinea pedis this looks better using silver alginate ooRight first second toe tinea pedis using silver alginate ooLarge pressure ulcers on theLeft ischial tuberosity. Small wound here Looks better. I am uncertain about the surface over the large wound. Using silver alginate 04/11/18; ooCellulitis in the right foot is resolved ooLeft lateral calf wound which was his original wounds still has 2 tiny open areas remaining this is just about closed ooNonviable surface on the left heel is better but still requires debridement ooLeft first second toe/tinea pedis still open using silver alginate ooRight first second toe wound tinea pedis I asked him to go back to using ketoconazole and silver alginate ooLarge pressure ulcers on the left ischial tuberosity this shear injury here is resolved. Wound  is smaller. No evidence of infection using silver alginate 04/18/18; ooPatient arrives with an intense area of cellulitis in the right mid lower calf extending into the right heel area. Bright red and warm. Smaller area on the left anterior leg. He has a significant history of MRSA. He will definitely need antibioticsoodoxycycline ooHe now has 2 open areas on the left ischial tuberosity the original large wound and now a satellite area which I think was above his initial satellite areas. Not a wonderful surface on this satellite area surrounding erythema which looks like pressure related. ooHis left lateral calf wound again his original wound is just about closed ooLeft heel pressure injury still requiring debridement ooLeft first second toe looks a lot better using silver alginate ooRight first second toe also using silver alginate and ketoconazole cream also looks  better 04/20/18; the patient was worked in early today out of concerns with his cellulitis on the right leg. I had started him on doxycycline. This was 2 days ago. His wife was concerned about the swelling in the area. Also concerned about the left buttock. He has not been systemically unwell no fever chills. No nausea vomiting or diarrhea 04/25/18; the patient's left buttock wound is continued to deteriorate he is using Hydrofera Blue. He is still completing clindamycin for the cellulitis on the right leg although all of this looks better. 05/02/18 ooLeft buttock wound still with a lot of drainage and a very tightly adherent fibrinous necrotic surface. He has a deeper area superiorly ooThe left lateral calf wound is still closed ooDTI wound on the left heel necrotic surface especially the circumference using Iodoflex ooAreas between his left first second toe and right first second toe both look better. Dorsally and the right first second toe he had a necrotic surface although at smaller. In using silver alginate and ketoconazole. I did a culture last week which was a deep tissue culture of the reminiscence of the open wound on the right first second toe dorsally. This grew a few Acinetobacter and a few methicillin-resistant staph aureus. Nevertheless the area actually this week looked better. I didn't feel the need to specifically address this at least in terms of systemic antibiotics. 05/09/18; wounds are measuring larger more drainage per our intake. We are using Santyl covered with alginate on the large superficial buttock wounds, Iodosorb on the left heel, ketoconazole and silver alginate to the dorsal first and second toes bilaterally. 05/16/18; ooThe area on his left buttock better in some aspects although the area superiorly over the ischial tuberosity required an extensive debridement.using Santyl ooLeft heel appears stable. Using Iodoflex ooThe areas between his first and second toes  are not bad however there is spreading erythema up the dorsal aspect of his left foot this looks like cellulitis again. He is insensate the erythema is really very brilliant.o Erysipelas He went to see an allergist days ago because he was itching part of this he had lab work done. This showed a white count of 15.1 with 70% neutrophils. Hemoglobin of 11.4 and a platelet count of 659,000. Last white count we had in Epic was a 2-1/2 years ago which was 25.9 but he was ill at the time. He was able to show me some lab work that was done by his primary physician the pattern is about the same. 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 But prompted me to go ahead and do x-rays of both feet and the  pelvis rule out osteomyelitis. He also had a comprehensive metabolic panel this was reasonably normal his albumin was 3.7 liver function tests BUN/creatinine all normal 05/23/18; x-rays of both his feet from last week were negative for underlying pulmonary abnormality. The x-ray of his pelvis however showed mild irregularity in the left ischial which may represent some early osteomyelitis. The wound in the left ischial continues to get deeper clearly now exposed muscle. Each week necrotic surface material over this area. Whereas the rest of the wounds do not look so bad. ooThe left ischial wound we have been using Santyl and calcium alginate ooT the left heel surface necrotic debris using Iodoflex o ooThe left lateral leg is still healed ooAreas on the left dorsal foot and the right dorsal foot are about the same. There is some inflammation on the left which might represent contact dermatitis, fungal dermatitis I am doubtful cellulitis although this looks better than last week 05/30/18; CT scan done at Hospital did not show any osteomyelitis or abscess. Suggested the possibility of underlying cellulitis although I don't see a lot of evidence of this at the  bedside ooThe wound itself on the left buttock/upper thigh actually looks somewhat better. No debridement ooLeft heel also looks better no debridement continue Iodoflex ooBoth dorsal first second toe spaces appear better using Lotrisone. Left still required debridement 06/06/18; ooIntake reported some purulent looking drainage from the left gluteal wound. Using Santyl and calcium alginate ooLeft heel looks better although still a nonviable surface requiring debridement ooThe left dorsal foot first/second webspace actually expanding and somewhat deeper. I may consider doing a shave biopsy of this area ooRight dorsal foot first/second webspace appears stable to improved. Using Lotrisone and silver alginate to both these areas 06/13/18 ooLeft gluteal surface looks better. Now separated in the 2 wounds. No debridement required. Still drainage. We'll continue silver alginate ooLeft heel continues to look better with Iodoflex continue this for at least another week ooOf his dorsal foot wounds the area on the left still has some depth although it looks better than last week. We've been using Lotrisone and silver alginate 06/20/18 ooLeft gluteal continues to look better healthy tissue ooLeft heel continues to look better healthy granulation wound is smaller. He is using Iodoflex and his long as this continues continue the Iodoflex ooDorsal right foot looks better unfortunately dorsal left foot does not. There is swelling and erythema of his forefoot. He had minor trauma to this several days ago but doesn't think this was enough to have caused any tissue injury. Foot looks like cellulitis, we have had this problem before 06/27/18 on evaluation today patient appears to be doing a little worse in regard to his foot ulcer. 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 Both of which would really be his only options for treating this infection. In the past he has been given and effusion of Orbactiv. This is done very well for him in the past again it's one time dosing IV antibiotic therapy. Subsequently I do believe this is something we're gonna need to see about doing at this point in time. Currently his other wounds seem to be doing somewhat better in my pinion I'm pretty happy in that regard. 07/03/18 on evaluation today patient's wounds actually appear to be doing fairly well. He has been tolerating the dressing changes without complication. All in all  he seems to be showing signs of improvement. In regard to the antibiotics he has been dealing with infectious disease since I saw him last week as far as getting this scheduled. In the end [facility]'s [facility] this coming Friday. In the meantime he has been continuing to perform the dressing changes in such as previous. There does not appear to be any evidence of infection worsengin at this time. 07/10/18; ooSince I last saw this man 2 weeks ago things have actually improved. IV antibiotics of resulted in less forefoot erythema although there is still some present. He is not systemically unwell ooLeft buttock wounds o2 now have no depth there is increased epithelialization Using silver alginate ooLeft heel still requires debridement using Iodoflex ooLeft dorsal foot still with a sizable wound about the size of a border but healthy granulation ooRight dorsal foot still with a slitlike area using silver alginate 07/18/18; the patient's cellulitis in the left foot is improved in fact I think it is on its way to resolving. ooLeft buttock wounds o2 both look better although the larger one has hypertension granulation we've been using silver alginate ooLeft heel has some thick circumferential redundant skin over the wound edge which will need to be  removed today we've been using Iodoflex ooLeft dorsal foot is still a sizable wound required debridement using silver alginate ooThe right dorsal foot is just about closed only a small open area remains here 07/25/18; left foot cellulitis is resolved ooLeft buttock wounds o2 both look better. Hyper-granulation on the major area ooLeft heel as some debris over the surface but otherwise looks a healthier wound. Using silver collagen ooRight dorsal foot is just about closed 07/31/18; arrives with our intake nurse worried about purulent drainage from the buttock. We had hyper-granulation here last week ooHis buttock wounds o2 continue to look better ooLeft heel some debris over the surface but measuring smaller. ooRight dorsal foot unfortunately has openings between the toes ooLeft foot superficial wound looks less aggravated. 08/07/18 ooButtock wounds continue to look better although some of her granulation and the larger medial wound. silver alginate ooLeft heel continues to look a lot better.silver collagen ooLeft foot superficial wound looks less stable. Requires debridement. He has a new wound superficial area on the foot on the lateral dorsal foot. ooRight foot looks better using silver alginate without Lotrisone 08/14/2018; patient was in the ER last week diagnosed with a UTI. He is now on Cefpodoxime and Macrodantin. ooButtock wounds continued to be smaller. Using silver alginate ooLeft heel continues to look better using silver collagen ooLeft foot superficial wound looks as though it is improving ooRight dorsal foot area is just about healed. 08/21/2018; patient is completed his antibiotics for his UTI. ooHe has 2 open areas on the buttocks. There is still not closed although the surface looks satisfactory. Using silver alginate ooLeft heel continues to improve using silver collagen ooThe bilateral dorsal foot areas which are at the base of his first and second  toes/possible tinea pedis are actually stable on the left but worse on the right. The area on the left required debridement of necrotic surface. After debridement I obtained a specimen for PCR culture. ooThe right dorsal foot which is been just about healed last week is now reopened 08/28/2018; culture done on the left dorsal foot showed coag negative staph both staph epidermidis and Lugdunensis. I think this is worthwhile initiating systemic treatment. I will use doxycycline  given his long list of allergies. The area on the left heel slightly improved but still requiring debridement. ooThe large wound on the buttock is just about closed whereas the smaller one is larger. Using silver alginate in this area 09/04/2018; patient is completing his doxycycline for the left foot although this continues to be a very difficult wound area with very adherent necrotic debris. We are using silver alginate to all his wounds right foot left foot and the small wounds on his buttock, silver collagen on the left heel. 09/11/2018; once again this patient has intense erythema and swelling of the left forefoot. Lesser degrees of erythema in the right foot. He has a long list of allergies and intolerances. I will reinstitute doxycycline. 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 Using silver alginate ooLeft heel also looks better using silver collagen ooUnfortunately both the areas on his feet look worse. The area on the left first second webspace is now gone through to the plantar part of his foot. The area on the left foot anteriorly is irritated with erythema and swelling in the forefoot. 09/25/2018 ooHis wound on the left plantar heel looks better. Using silver collagen ooThe area on the left buttock 2 small remnant areas. One is closed one is still open. Using silver alginate ooThe areas between both his first and second toes look worse. This in spite of  long-standing antifungal therapy with ketoconazole and silver alginate which should have antifungal activity ooHe has small areas around his original wound on the left calf one is on the bottom of the original scar tissue and one superiorly both of these are small and superficial but again given wound history in this site this is worrisome 10/02/2018 ooLeft plantar heel continues to gradually contract using silver collagen ooLeft buttock wound is unchanged using silver alginate ooThe areas on his dorsal feet between his first and second toes bilaterally look about the same. 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 Small open area here. We have been using silver alginate 10/09/2018; ooLeft plantar heel continues to look healthy and contract. No debridement is required ooLeft buttock slightly smaller with a tape injury wound just below which was new this week ooDorsal feet somewhat improved I have been using clindamycin ooLeft lateral looks lower extremity the actual open area looks worse although a lot of this is epithelialized. I am going to change to silver collagen today He has a lot more swelling in the right leg although this is not pitting not red and not particularly warm there is a lot of spasm in the right leg usually indicative of people with paralysis of some underlying discomfort. We have reviewed his vascular status from 2017 he had a left greater saphenous vein ablation. I wonder about referring him back to vascular surgery if the area on the left leg continues to deteriorate. 10/16/2018 in today for follow-up and management of multiple lower extremity ulcers. His left Buttock wound is much lower smaller and almost closed completely. The wound to the left ankle has began to reopen with  Epithelialization and some adherent slough. He has multiple new areas to the left foot and leg. The left dorsal foot without much improvement. Wound present between left great webspace and 2nd toe. Erythema and edema present right  leg. Right LE ultrasound obtained on 10/10/18 was negative for DVT . 10/23/2018; ooLeft buttock is closed over. Still dry macerated skin but there is no open wound. I suspect this is chronic pressure/moisture ooLeft lateral calf is quite a bit worse than when I saw this last. There is clearly drainage here he has macerated skin into the left plantar heel. We will change the primary dressing to alginate ooLeft dorsal foot has some improvement in overall wound area. Still using clindamycin and silver alginate ooRight dorsal foot about the same as the left using clindamycin and silver alginate ooThe erythema in the right leg has resolved. He is DVT rule out was negative ooLeft heel pressure area required debridement although the wound is smaller and the surface is health 10/26/2018 ooThe patient came back in for his nurse check today predominantly because of the drainage coming out of the left lateral leg with a recent reopening of his original wound on the left lateral calf. He comes in today with a large amount of surrounding erythema around the wound extending from the calf into the ankle and even in the area on the dorsal foot. He is not systemically unwell. He is not febrile. Nevertheless this looks like cellulitis. We have been using silver alginate to the area. I changed him to a regular visit and I am going to prescribe him doxycycline. The rationale here is a long list of medication intolerances and a history of MRSA. I did not see anything that I thought would provide a valuable culture 10/30/2018 ooFollow-up from his appointment 4 days ago with really an extensive area of cellulitis in the left calf left lateral ankle and left dorsal foot. I put him  on doxycycline. He has a long list of medication allergies which are true allergy reactions. 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 In any case he is a lot better today. The erythema especially in the anterior and lateral left calf is better. He still has left ankle erythema. He also is complaining about increasing edema in the right leg we have only been using Kerlix Coban and he has been doing the wraps at home. Finally he has a spotty rash on the medial part of his upper left calf which looks like folliculitis or perhaps wrap occlusion type injury. Small superficial macules not pustules 11/06/18 patient arrives today with again a considerable degree of erythema around the wound on the left lateral calf extending into the dorsal ankle and dorsal foot. This is a lot worse than when I saw this last week. He is on doxycycline really with not a lot of improvement. He has not been systemically unwell Wounds on the; left heel actually looks improved. Original area on the left foot and proximity to the first and second toes looks about the same. He has superficial areas on the dorsal foot, anterior calf and then the reopening of his original wound on the left lateral calf which looks about the same ooThe only area he has on the right is the dorsal webspace first and second which is smaller. ooHe has a large area of dry erythematous skin on the left buttock small open area here. 11/13/2018; the patient arrives in much better condition. The erythema around the wound on the left lateral calf is a lot better. Not sure whether this was the clindamycin or the TCA and ketoconazole or just in the improvement in edema control [stasis dermatitis]. In  any case this is a lot better. The area on the left heel is very small and just about resolved using silver collagen we have been using silver alginate to the areas on his dorsal  feet 11/20/2018; his wounds include the left lateral calf, left heel, dorsal aspects of both feet just proximal to the first second webspace. He is stable to slightly improved. I did not think any changes to his dressings were going to be necessary 11/27/2018 he has a reopening on the left buttock which is surrounded by what looks like tinea or perhaps some other form of dermatitis. The area on the left dorsal foot has some erythema around it I have marked this area but I am not sure whether this is cellulitis or not. Left heel is not closed. Left calf the reopening is really slightly longer and probably worse 1/13; in general things look better and smaller except for the left dorsal foot. Area on the left heel is just about closed, left buttock looks better only a small wound remains in the skin looks better [using Lotrisone] 1/20; the area on the left heel only has a few remaining open areas here. Left lateral calf about the same in terms of size, left dorsal foot slightly larger right lateral foot still not closed. The area on the left buttock has no open wound and the surrounding skin looks a lot better 1/27; the area on the left heel is closed. Left lateral calf better but still requiring extensive debridements. The area on his left buttock is closed. He still has the open areas on the left dorsal foot which is slightly smaller in the right foot which is slightly expanded. We have been using Iodoflex on these areas as well 2/3; left heel is closed. Left lateral calf still requiring debridement using Iodoflex there is no open area on his left buttock however he has dry scaly skin over a large area of this. Not really responding well to the Lotrisone. Finally the areas on his dorsal feet at the level of the first second webspace are slightly smaller on the right and about the same on the left. Both of these vigorously debrided with Anasept and gauze 2/10; left heel remains closed he has dry  erythematous skin over the left buttock but there is no open wound here. Left lateral leg has come in and with. Still requiring debridement we have been using Iodoflex here. Finally the area on the left dorsal foot and right dorsal foot are really about the same extremely dry callused fissured areas. He does not yet have a dermatology appointment 2/17; left heel remains closed. He has a new open area on the left buttock. The area on the left lateral calf is bigger longer and still covered in necrotic debris. No major change in his foot areas bilaterally. I am awaiting for a dermatologist to look on this. We have been using ketoconazole I do not know that this is been doing any good at all. 2/24; left heel remains closed. The left buttock wound that was new reopening last week looks better. The left lateral calf appears better also although still requires debridement. The major area on his foot is the left first second also requiring debridement. We have been putting Prisma on all wounds. I do not believe that the ketoconazole has done too much good for his feet. He will use Lotrisone I am going to give him a 2-week course of terbinafine. We still do not have a dermatology  appointment 3/2 left heel remains closed however there is skin over bone in this area I pointed this out to him today. The left buttock wound is epithelialized but still does not look completely stable. The area on the left leg required debridement were using silver collagen here. With regards to his feet we changed to Lotrisone last week and silver alginate. 3/9; left heel remains closed. Left buttock remains closed. The area on the right foot is essentially closed. The left foot remains unchanged. Slightly smaller on the left lateral calf. Using silver collagen to both of these areas 3/16-Left heel remains closed. Area on right foot is closed. Left lateral calf above the lateral malleolus open wound requiring debridement with easy  bleeding. Left dorsal wound proximal to first toe also debrided. Left ischial area open new. Patient has been using Prisma with wrapping every 3 days. Dermatology appointment is apparently tomorrow.Patient has completed his terbinafine 2-week course with some apparent improvement according to him, there is still flaking and dry skin in his foot on the left 3/23; area on the right foot is reopened. The area on the left anterior foot is about the same still a very necrotic adherent surface. He still has the area on the left leg and reopening is on the left buttock. He apparently saw dermatology although I do not have a note. According to the patient who is usually fairly well informed they did not have any good ideas. Put him on oral terbinafine which she is been on before. 3/30; using silver collagen to all wounds. Apparently his dermatologist put him on doxycycline and rifampin presumably some culture grew staph. I do not have this result. He remains on terbinafine although I have used terbinafine on him before 4/6; patient has had a fairly substantial reopening on the right foot between the first and second toes. He is finished his terbinafine and I believe is on doxycycline and rifampin still as prescribed by dermatology. We have been using silver collagen to all his wounds although the patient reports that he thinks silver alginate does better on the wounds on his buttock. 4/13; the area on his left lateral calf about the same size but it did not require debridement. ooLeft dorsal foot just proximal to the webspace between the first and second toes is about the same. Still nonviable surface. I note some superficial bronze discoloration of the dorsal part of his foot ooRight dorsal foot just proximal to the first and second toes also looks about the same. I still think there may be the same discoloration I noted above on the left ooLeft buttock wound looks about the same 4/20; left lateral  calf appears to be gradually contracting using silver collagen. 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 The left dorsal foot wound is debrided of tightly adherent necrotic debris and really cleans up quite nicely. The right area is worse with expansion. I did not debride this it is now over the base of the second toe ooThe area on his left buttock is smaller no debridement is required using silver collagen 5/4; left calf continues to make good progress. ooHe arrives with erythema around the wounds on his dorsal foot which even extends to the plantar aspect. Very concerning for coexistent infection. He is finished the erythromycin I gave him for possible erythrasma this does not seem to have helped. ooThe area on the left foot is about the same base of the dorsal toes ooIs area on  the buttock looks improved on the left 5/11; left calf and left buttock continued to make good progress. Left foot is about the same to slightly improved. ooMajor problem is on the right foot. He has not had an x-ray. Deep tissue culture I did last week showed both Enterobacter and E. coli. I did not change the doxycycline I put him on empirically although neither 1 of these were plated to doxycycline. He arrives today with the erythema looking worse on both the dorsal and plantar foot. Macerated skin on the bottom of the foot. he has not been systemically unwell 5/18-Patient returns at 1 week, left calf wound appears to be making some progress, left buttock wound appears slightly worse than last time, left foot wound looks slightly better, right foot redness is marginally better. X-ray of both feet show no air or evidence of osteomyelitis. Patient is finished his Omnicef and terbinafine. He continues to have macerated skin on the bottom of the left foot as well as right 5/26; left calf wound is better, left buttock wound appears to have multiple small  superficial open areas with surrounding macerated skin. X-rays that I did last time showed no evidence of osteomyelitis in either foot. He is finished cefdinir and doxycycline. I do not think that he was on terbinafine. He continues to have a large superficial open area on the right foot anterior dorsal and slightly between the first and second toes. I did send him to dermatology 2 months ago or so wondering about whether they would do a fungal scraping. I do not believe they did but did do a culture. We have been using silver alginate to the toe areas, he has been using antifungals at home topically either ketoconazole or Lotrisone. We are using silver collagen on the left foot, silver alginate on the right, silver collagen on the left lateral leg and silver alginate on the left buttock 6/1; left buttock area is healed. We have the left dorsal foot, left lateral leg and right dorsal foot. We are using silver alginate to the areas on both feet and silver collagen to the area on his left lateral calf 6/8; the left buttock apparently reopened late last week. He is not really sure how this happened. He is tolerating the terbinafine. Using silver alginate to all wounds 6/15; left buttock wound is larger than last week but still superficial. ooCame in the clinic today with a report of purulence from the left lateral leg I did not identify any infection ooBoth areas on his dorsal feet appear to be better. He is tolerating the terbinafine. Using silver alginate to all wounds 6/22; left buttock is about the same this week, left calf quite a bit better. His left foot is about the same however he comes in with erythema and warmth in the right forefoot once again. Culture that I gave him in the beginning of May showed Enterobacter and E. coli. I gave him doxycycline and things seem to improve although neither 1 of these organisms was specifically plated. 6/29; left buttock is larger and dry this week. Left  lateral calf looks to me to be improved. Left dorsal foot also somewhat improved right foot completely unchanged. The erythema on the right foot is still present. He is completing the Ceftin dinner that I gave him empirically [see discussion above.) 7/6 - All wounds look to be stable and perhaps improved, the left buttock wound is slightly smaller, per patient bleeds easily, completed ceftin, the right foot redness is  less, he is on terbinafine 7/13; left buttock wound about the same perhaps slightly narrower. Area on the left lateral leg continues to narrow. Left dorsal foot slightly smaller right foot about the same. We are using silver alginate on the right foot and Hydrofera Blue to the areas on the left. Unna boot on the left 2 layer compression on the right 7/20; left buttock wound absolutely the same. Area on lateral leg continues to get better. Left dorsal foot require debridement as did the right no major change in the 7/27; left buttock wound the same size necrotic debris over the surface. The area on the lateral leg is closed once again. His left foot looks better right foot about the same although there is some involvement now of the posterior first second toe area. He is still on terbinafine which I have given him for a month, not certain a centimeter major change 06/25/19-All wounds appear to be slightly improved according to report, left buttock wound looks clean, both foot wounds have minimal to no debris the right dorsal foot has minimal slough. We are using Hydrofera Blue to the left and silver alginate to the right foot and ischial wound. 8/10-Wounds all appear to be around the same, the right forefoot distal part has some redness which was not there before, however the wound looks clean and small. Ischial wound looks about the same with no changes 8/17; his wound on the left lateral calf which was his original chronic venous insufficiency wound remains closed. Since I last saw him  the areas on the left dorsal foot right dorsal foot generally appear better but require debridement. The area on his left initial tuberosity appears somewhat larger to me perhaps hyper granulated and bleeds very easily. We have been using Hydrofera Blue to the left dorsal foot and silver alginate to everything else 8/24; left lateral calf remains closed. The areas on his dorsal feet on the webspace of the first and second toes bilaterally both look better. The area on the left buttock which is the pressure ulcer stage II slightly smaller. I change the dressing to Hydrofera Blue to all areas 8/31; left lateral calf remains closed. The area on his dorsal feet bilaterally look better. Using Hydrofera Blue. Still requiring debridement on the left foot. No change in the left buttock pressure ulcers however 9/14; left lateral calf remains closed. Dorsal feet look quite a bit better than 2 weeks ago. Flaking dry skin also a lot better with the ammonium lactate I gave him 2 weeks ago. The area on the left buttock is improved. He states that his Roho cushion developed a leak and he is getting a new one, in the interim he is offloading this vigorously 9/21; left calf remains closed. Left heel which was a possible DTI looks better this week. He had macerated tissue around the left dorsal foot right foot looks satisfactory and improved left buttock wound. I changed his dressings to his feet to silver alginate bilaterally. Continuing Hydrofera Blue on the left buttock. 9/28 left calf remains closed. Left heel did not develop anything [possible DTI] dry flaking skin on the left dorsal foot. Right foot looks satisfactory. Improved left buttock wound. We are using silver alginate on his feet Hydrofera Blue on the buttock. I have asked him to go back to the Lotrisone on his feet including the wounds and surrounding areas 10/5; left calf remains closed. The areas on the left and right feet about the same. A lot of  this is epithelialized however debris over the remaining open areas. He is using Lotrisone and silver alginate. The area on the left buttock using Hydrofera Blue 10/26. Patient has been out for 3 weeks secondary to Covid concerns. He tested negative but I think his wife tested positive. He comes in today with the left foot substantially worse, right foot about the same. Even more concerning he states that the area on his left buttock closed over but then reopened and is considerably deeper in one aspect than it was before [stage III wound] 11/2; left foot really about the same as last week. Quarter sized wound on the dorsal foot just proximal to the first second toes. Surrounding erythema with areas of denuded epithelium. This is not really much different looking. Did not look like cellulitis this time however. ooRight foot area about the same.. We have been using silver alginate alginate on his toes ooLeft buttock still substantial irritated skin around the wound which I think looks somewhat better. We have been using Hydrofera Blue here. 11/9; left foot larger than last week and a very necrotic surface. Right foot I think is about the same perhaps slightly smaller. Debris around the circumference also addressed. Unfortunately on the left buttock there is been a decline. Satellite lesions below the major wound distally and now a an additional one posteriorly we have been using Hydrofera Blue but I think this is a pressure issue 11/16; left foot ulcer dorsally again a very adherent necrotic surface. Right foot is about the same. Not much change in the pressure ulcer on his left buttock. 11/30; left foot ulcer dorsally basically the same as when I saw him 2 weeks ago. Very adherent fibrinous debris on the wound surface. Patient reports a lot of drainage as well. 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 We have been using Iodoflex, patient changed back  to alginate because of the drainage. Area on his right dorsal foot really looks benign with a healthier surface certainly a lot better than on the left. Left buttock wounds all improved using Hydrofera Blue 12/7; left dorsal foot again no improvement. Tightly adherent debris. PCR culture I did last week only showed likely skin contaminant. I have gone ahead and done a punch biopsy of this which is about the last thing in terms of investigations I can think to do. He has known venous insufficiency and venous hypertension and this could be the issue here. The area on the right foot is about the same left buttock slightly worse according to our intake nurse secondary to [facility] Blue sticking to the wound 12/14; biopsy of the left foot that I did last time showed changes that could be related to wound healing/chronic stasis dermatitis phenomenon no neoplasm. We have been using silver alginate to both feet. I change the one on the left today to Sorbact and silver alginate to his other 2 wounds 12/28; the patient arrives with the following problems; ooMajor issue is the dorsal left foot which continues to be a larger deeper wound area. Still with a completely nonviable surface ooParadoxically the area mirror image on the right on the right dorsal foot appears to be getting better. ooHe had some loss of dry denuded skin from the lower part of his original wound on the left lateral calf. Some of this area looked a little vulnerable and for this reason we put him in wrap that on this side this week ooThe area on his  left buttock is larger. He still has the erythematous circular area which I think is a combination of pressure, sweat. This does not look like cellulitis or fungal dermatitis 11/26/2019; -Dorsal left foot large open wound with depth. Still debris over the surface. Using Sorbact ooThe area on the dorsal right foot paradoxically has closed over [facility] has a reopening on the left ankle  laterally at the base of his original wound that extended up into the calf. This appears clean. ooThe left buttock wound is smaller but with very adherent necrotic debris over the surface. We have been using silver alginate here as well The patient had arterial studies done in 2017. He had biphasic waveforms at the dorsalis pedis and posterior tibial bilaterally. ABI in the left was 1.17. Digit waveforms were dampened. He has slight spasticity in the great toes I do not think a TBI would be possible 1/11; the patient comes in today with a sizable reopening between the first and second toes on the right. This is not exactly in the same location where we have been treating wounds previously. According to our intake nurse this was actually fairly deep but 0.6 cm. The area on the left dorsal foot looks about the same the surface is somewhat cleaner using Sorbact, his MRI is in 2 days. We have not managed yet to get arterial studies. The new reopening on the left lateral calf looks somewhat better using alginate. The left buttock wound is about the same using alginate 1/18; the patient had his ARTERIAL studies which were quite normal. ABI in the right at 1.13 with triphasic/biphasic waveforms on the left ABI 1.06 again with triphasic/biphasic waveforms. It would not have been possible to have done a toe brachial index because of spasticity. We have been using Sorbac to the left foot alginate to the rest of his wounds on the right foot left lateral calf and left buttock 1/25; arrives in clinic with erythema and swelling of the left forefoot worse over the first MTP area. This extends laterally dorsally and but also posteriorly. Still has an area on the left lateral part of the lower part of his calf wound it is eschared and clearly not closed. ooArea on the left buttock still with surrounding irritation and erythema. ooRight foot surface wound dorsally. The area between the right and first and second toes  appears better. 2/1; ooThe left foot wound is about the same. Erythema slightly better I gave him a week of doxycycline empirically ooRight foot wound is more extensive extending between the toes to the plantar surface ooLeft lateral calf really no open surface on the inferior part of his original wound however the entire area still looks vulnerable ooAbsolutely no improvement in the left buttock wound required debridement. 2/8; the left foot is about the same. Erythema is slightly improved I gave him clindamycin last week. ooRight foot looks better he is using Lotrimin and silver alginate ooHe has a breakdown in the left lateral calf. Denuded epithelium which I have removed ooLeft buttock about the same were using Hydrofera Blue 2/15; left foot is about the same there is less surrounding erythema. Surface still has tightly adherent debris which I have debriding however not making any progress ooRight foot has a substantial wound on the medial right second toe between the first and second webspace. ooStill an open area on the left lateral calf distal area. ooButtock wound is about the same 2/22; left foot is about the same less surrounding erythema. Surface has adherent  debris. Polymen Ag Right foot area significant wound between the first and second toes. We have been using silver alginate here Left lateral leg polymen Ag at the base of his original venous insufficiency wound ooLeft buttock some improvement here 3/1; ooRight foot is deteriorating in the first second toe webspace. Larger and more substantial. We have been using silver alginate. ooLeft dorsal foot about the same markedly adherent surface debris using PolyMem Ag ooLeft lateral calf surface debris using PolyMem AG ooLeft buttock is improved again using PolyMem Ag. ooHe is completing his terbinafine. The erythema in the foot seems better. He has been on this for 2 weeks 3/8; no improvement in any wound area in  fact he has a small open area on the dorsal midfoot which is new this week. He has not gotten his foot x-rays yet 3/15; his x-rays were both negative for osteomyelitis of both feet. No major change in any of his wounds on the extremities however his buttock wounds are better. We have been using polymen on the buttocks, left lower leg. Iodoflex on the left foot and silver alginate on the right 3/22; arrives in clinic today with the 2 major issues are the improvement in the left dorsal foot wound which for once actually looks healthy with a nice healthy wound surface without debridement. Using Iodoflex here. Unfortunately on the left lateral calf which is in the distal part of his original wound he came to the clinic here for there was purulent drainage noted some increased breakdown scattered around the original area and a small area proximally. We we are using polymen here will change to silver alginate today. His buttock wound on the left is better and I think the area on the right first second toe webspace is also improved 3/29; left dorsal foot looks better. Using Iodoflex. Left ankle culture from deterioration last time grew E. coli, Enterobacter and Enterococcus. I will give him a course of cefdinir although that will not cover Enterococcus. The area on the right foot in the webspace of the first and second toe lateral first toe looks better. The area on his buttock is about healed Vascular appointment is on April 21. This is to look at his venous system vis--vis continued breakdown of the wounds on the left including the left lateral leg and left dorsal foot he. He has had previous ablations on this side 4/5; the area between the right first and second toes lateral aspect of the first toe looks better. Dorsal aspect of the left first toe on the left foot also improved. Unfortunately the left lateral lower leg is larger and there is a second satellite wound superiorly. The usual superficial  abrasions on the left buttock overall better but certainly not closed 4/12; the area between the right first and second toes is improved. Dorsal aspect of the left foot also slightly smaller with a vibrant healthy looking surface. No real change in the left lateral leg and the left buttock wound is healed He has an unaffordable co-pay for Apligraf. Appointment with vein and vascular with regards to the left leg venous part of the circulation is on 4/21 4/19; we continue to see improvement in all wound areas. Although this is minor. He has his vascular appointment on 4/21. The area on the left buttock has not reopened although right in the center of this area the skin looks somewhat threatened 4/26; the left buttock is unfortunately reopened. In general his left dorsal foot has a healthy surface and  looks somewhat smaller although it was not measured as such. The area between his first and second toe webspace on the right as a small wound against the first toe. The patient saw vascular surgery. The real question I was asking was about the small saphenous vein on the left. He has previously ablated left greater saphenous vein. Nothing further was commented on on the left. Right greater saphenous vein without reflux at the saphenofemoral junction or proximal thigh there was no indication for ablation of the right greater saphenous vein duplex was negative for DVT bilaterally. They did not think there was anything from a vascular surgery point of view that could be offered. They ABIs within normal limits 5/3; only small open area on the left buttock. The area on the left lateral leg which was his original venous reflux is now 2 wounds both which look clean. We are using Iodoflex on the left dorsal foot which looks healthy and smaller. He is down to a very tiny area between the right first and second toes, using silver alginate 5/10; all of his wounds appear better. We have much better edema control in 4  layer compression on the left. This may be the factor that is allowing the left foot and left lateral calf to heal. He has external compression garments at home 04/14/20-All of his wounds are progressing well, the left forefoot is practically closed, left ischium appears to be about the same, right toe webspace is also smaller. The left lateral leg is about the same, continue using Hydrofera Blue to this, silver alginate to the ischium, Iodoflex to the toe space on the right Objective Constitutional alert and oriented x 3. sitting or standing blood pressure is within target range for patient.. supine blood pressure is within target range for patient.. pulse regular and within target range for patient.Marland Kitchen respirations regular, non-labored and within target range for patient.Marland Kitchen temperature within target range for patient.. Well- nourished and well-hydrated in no acute distress. Vitals Time Taken: 7:51 AM, Height: 70 in, Weight: 216 lbs, BMI: 31, Temperature: 98.2 F, Pulse: 96 bpm, Respiratory Rate: 20 breaths/min, Blood Pressure: 112/67 mmHg. General Notes: Left ischial wound is about the same with clean base, surrounding skin intact, the left lateral leg wound has mild callus formation around the edges the wound base appears healthy Integumentary (Hair, Skin) Wound #24 status is Open. Original cause of wound was Gradually Appeared. The wound is located on the Left,Dorsal Foot. The wound measures 0.5cm length x 1.1cm width x 0.2cm depth; 0.432cm^2 area and 0.086cm^3 volume. There is Fat Layer (Subcutaneous Tissue) Exposed exposed. There is no tunneling or undermining noted. There is a medium amount of serosanguineous drainage noted. The wound margin is flat and intact. There is large (67-100%) red granulation within the wound bed. There is a small (1-33%) amount of necrotic tissue within the wound bed including Adherent Slough. Wound #37 status is Open. Original cause of wound was Gradually Appeared.  The wound is located on the Left,Lateral Malleolus. The wound measures 2cm length x 0.7cm width x 0.1cm depth; 1.1cm^2 area and 0.11cm^3 volume. There is Fat Layer (Subcutaneous Tissue) Exposed exposed. There is no tunneling or undermining noted. There is a medium amount of serosanguineous drainage noted. The wound margin is flat and intact. There is large (67-100%) red granulation within the wound bed. There is no necrotic tissue within the wound bed. Wound #38 status is Open. Original cause of wound was Gradually Appeared. The wound is located on the Right  T - Web between 1st and 2nd. The wound oe measures 0.4cm length x 0.2cm width x 0.2cm depth; 0.063cm^2 area and 0.013cm^3 volume. There is Fat Layer (Subcutaneous Tissue) Exposed exposed. There is no tunneling or undermining noted. There is a medium amount of serosanguineous drainage noted. The wound margin is distinct with the outline attached to the wound base. There is large (67-100%) red granulation within the wound bed. There is a small (1-33%) amount of necrotic tissue within the wound bed including Adherent Slough. Wound #41 status is Open. Original cause of wound was Gradually Appeared. The wound is located on the Left Ischium. The wound measures 1cm length x 0.8cm width x 0.1cm depth; 0.628cm^2 area and 0.063cm^3 volume. There is Fat Layer (Subcutaneous Tissue) Exposed exposed. There is no tunneling or undermining noted. There is a small amount of serosanguineous drainage noted. The wound margin is distinct with the outline attached to the wound base. There is large (67-100%) pink granulation within the wound bed. There is no necrotic tissue within the wound bed. 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 There was a Four Layer Compression Therapy Procedure by Levan Hurst, RN. Post procedure Diagnosis Wound #37: Same as Pre-Procedure Plan Follow-up Appointments: Return Appointment in 2 weeks. - Monday Nurse Visit: - 1 week for rewrap Dressing Change Frequency: Wound #24 Left,Dorsal Foot: Do not change entire dressing for one week. Wound #37 Left,Lateral Malleolus: Do not change entire dressing for one week. Wound #38 Right T - Web between 1st and 2nd: oe Change Dressing every other day. Wound #41 Left Ischium: Change Dressing every other day. Skin Barriers/Peri-Wound Care: Antifungal cream - on toes on both feet daily Barrier cream - to leg/ankle and left foot Moisturizing lotion - both legs TCA Cream or Ointment Primary Wound Dressing: Wound #24 Left,Dorsal Foot: Iodoflex Wound #37 Left,Lateral Malleolus: Hydrofera Blue Wound #38 Right T - Web between 1st and 2nd: oe Calcium Alginate with Silver Wound #41 Left Ischium: Calcium Alginate with Silver Secondary Dressing: Wound #24 Left,Dorsal Foot: Dry Gauze ABD pad Wound #37 Left,Lateral Malleolus: Dry Gauze ABD pad Wound #38 Right T - Web between 1st and 2nd: oe Kerlix/Rolled Gauze - secure with tape Dry Gauze Wound #41 Left Ischium: Dry Gauze ABD pad Edema Control: 4 layer compression: Left lower extremity Elevate legs to the level of the heart or above for 30 minutes daily and/or when sitting, a frequency of: - throughout the day Support Garment 30-40 mm/Hg pressure to: - Juxtalite to right leg Off-Loading: Low air-loss mattress (Group 2) Roho cushion for wheelchair Turn and reposition every 2 hours - out of wheelchair throughout the day,  try to lay on sides, sleep in the bed not the recliner 1. Continue using current dressings for the wounds which are progressing in the right direction 2. Continue offloading with air loss mattress and Roho cushion 3. Return to clinic next week Electronic Signature(s) Signed: 04/14/2020 8:29:11 AM By: Tobi Bastos MD, MBA Entered By: Tobi Bastos on 04/14/2020 08:29:11 -------------------------------------------------------------------------------- SuperBill Details Patient Name: Date of Service: Hilgers,  [name] 04/14/2020 Medical Record Number: [id number] Patient Account Number: [id number] Date of Birth/Sex: Treating RN: 1986-09-01 (35 y.o. [name] [name] Primary Care Provider: [place], [place] Other Clinician: Referring Provider: Treating Provider/Extender: Shyrl Numbers, [name]: 223 Diagnosis Coding ICD-10 Codes Code Description I87.332 Chronic venous hypertension (idiopathic) with ulcer and inflammation of left lower extremity L97.321 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 to breakdown of skin L97.521 Non-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 L97.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, 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, complete Facility Procedures CPT4 Code: [id number] Description: (Facility Use Only) 29581LT - [facility] LWR LT LEG Modifier: Quantity: 1 Physician Procedures : CPT4 Code Description Modifier [id number] - WC PHYS LEVEL 3 - EST PT ICD-10 Diagnosis Description I87.332 Chronic venous hypertension (idiopathic) with ulcer and inflammation of left lower extremity Quantity: 1 Electronic Signature(s) Signed: 04/14/2020 5:12:22 PM By: Levan Hurst RN, BSN Signed: 04/17/2020 4:57:23 PM By: Tobi Bastos MD, MBA Previous Signature: 04/14/2020 8:29:30 AM Version By: Tobi Bastos MD, MBA Entered  By: Levan Hurst on 04/14/2020 08:31:23

## 2020-04-22 ENCOUNTER — Encounter (HOSPITAL_BASED_OUTPATIENT_CLINIC_OR_DEPARTMENT_OTHER): Payer: BC Managed Care – PPO | Attending: Internal Medicine | Admitting: Internal Medicine

## 2020-04-22 DIAGNOSIS — L89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, stage 3: Secondary | ICD-10-CM | POA: Diagnosis not present

## 2020-04-22 DIAGNOSIS — I10 Essential (primary) hypertension: Secondary | ICD-10-CM | POA: Diagnosis not present

## 2020-04-22 DIAGNOSIS — L97321 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 to breakdown of skin: Secondary | ICD-10-CM | POA: Insufficient documentation

## 2020-04-22 DIAGNOSIS — L89623 Pressure ulcer of left heel, stage 3: Secondary | ICD-10-CM | POA: Diagnosis not present

## 2020-04-22 DIAGNOSIS — Z87440 Personal history of urinary (tract) infections: Secondary | ICD-10-CM | POA: Diagnosis not present

## 2020-04-22 DIAGNOSIS — I872 Venous insufficiency (chronic) (peripheral): Secondary | ICD-10-CM | POA: Diagnosis not present

## 2020-04-22 DIAGNOSIS — I890 Lymphedema, not elsewhere classified: Secondary | ICD-10-CM | POA: Diagnosis not present

## 2020-04-22 DIAGNOSIS — Z8614 Personal history of Methicillin resistant Staphylococcus aureus infection: Secondary | ICD-10-CM | POA: Diagnosis not present

## 2020-04-22 DIAGNOSIS — L03115 Cellulitis of right lower limb: Secondary | ICD-10-CM | POA: Diagnosis not present

## 2020-04-22 DIAGNOSIS — Z9081 Acquired absence of spleen: Secondary | ICD-10-CM | POA: Insufficient documentation

## 2020-04-22 DIAGNOSIS — L97511 Non-pressure chronic ulcer of other part of right foot limited to breakdown of skin: Secondary | ICD-10-CM | POA: Diagnosis not present

## 2020-04-22 DIAGNOSIS — G8221 Paraplegia, complete: Secondary | ICD-10-CM | POA: Insufficient documentation

## 2020-04-22 DIAGNOSIS — L97521 Non-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: Secondary | ICD-10-CM | POA: Insufficient documentation

## 2020-04-22 NOTE — Progress Notes (Signed)
[name], [name] ([id number]) Visit Report for 04/22/2020 SuperBill Details Patient Name: Date of Service: [name], [name] 04/22/2020 Medical Record Number: [id number] Patient Account Number: [id number] Date of Birth/Sex: Treating RN: 1986/08/26 (34 y.o. [name] Primary Care Provider: [place], [place] Other Clinician: Referring Provider: Treating Provider/Extender: [name]: 224 Diagnosis Coding ICD-10 Codes Code Description I87.332 Chronic venous hypertension (idiopathic) with ulcer and inflammation of left lower extremity L97.321 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 to breakdown of skin L97.521 Non-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 L97.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, 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, complete Facility Procedures CPT4 Code Description Modifier Quantity [id number] (Facility Use Only) [phone] - APPLY MULTLAY COMPRS LWR LT LEG 1 Electronic Signature(s) Signed: 04/22/2020 5:52:05 PM By: Linton Ham MD Signed: 04/22/2020 5:56:12 PM By: Deon Pilling Entered By: Deon Pilling on 04/22/2020 08:13:10

## 2020-04-22 NOTE — Progress Notes (Signed)
Chavarria, ARCH PLANT ([id number]) Visit Report for 04/22/2020 Arrival Information Details Patient Name: Date of Service: Blasko, [name] 04/22/2020 7:30 A M Medical Record Number: [id number] Patient Account Number: [id number] Date of Birth/Sex: Treating RN: 04/09/1986 (34 y.o. [name] Primary Care Aelyn Stanaland: [name], [name] Other Clinician: Referring Quanta Robertshaw: Treating Laquida Cotrell/Extender: [name]: 26 Visit Information History Since Last Visit Added or deleted any medications: No Patient Arrived: Wheel Chair Any new allergies or adverse reactions: No Arrival Time: 08:04 Had a fall or experienced change in No Accompanied By: self activities of daily living that may affect Transfer Assistance: Other risk of falls: Patient Requires Transmission-Based Precautions: No Signs or symptoms of abuse/neglect since last visito No Patient Has Alerts: Yes Hospitalized since last visit: No Patient Alerts: R ABI = 1.0 Implantable device outside of the clinic excluding No L ABI = 1.1 cellular tissue based products placed in the center since last visit: Has Dressing in Place as Prescribed: Yes Has Compression in Place as Prescribed: Yes Pain Present Now: No Notes transfers self Electronic Signature(s) Signed: 04/22/2020 5:56:12 PM By: Deon Pilling Entered By: Deon Pilling on 04/22/2020 08:05:12 -------------------------------------------------------------------------------- Compression Therapy Details Patient Name: Date of Service: Kuenzi, [name] 04/22/2020 7:30 A M Medical Record Number: [id number] Patient Account Number: [id number] Date of Birth/Sex: Treating RN: 07-03-1986 (34 y.o. [name] Primary Care Najae Filsaime: [place], [place] Other Clinician: Referring Christy Friede: Treating Kasey Ewings/Extender: [name]: 224 Compression Therapy Performed for Wound Assessment: Wound #24 Left,Dorsal Foot Performed By:  Clinician Deon Pilling, RN Compression Type: Four Layer Electronic Signature(s) Signed: 04/22/2020 5:56:12 PM By: Deon Pilling Entered By: Deon Pilling on 04/22/2020 08:08:42 -------------------------------------------------------------------------------- Compression Therapy Details Patient Name: Date of Service: Whittle, [name] 04/22/2020 7:30 A M Medical Record Number: [id number] Patient Account Number: [id number] Date of Birth/Sex: Treating RN: [birth year] (34 y.o. [name] Primary Care Shiv Shuey: [place], [place] Other Clinician: Referring Tarri Guilfoil: Treating Lavona Norsworthy/Extender: [name]: 224 Compression Therapy Performed for Wound Assessment: Wound #37 Left,Lateral Malleolus Performed By: Clinician Deon Pilling, RN Compression Type: Four Layer Electronic Signature(s) Signed: 04/22/2020 5:56:12 PM By: Deon Pilling Entered By: Deon Pilling on 04/22/2020 08:08:42 -------------------------------------------------------------------------------- Encounter Discharge Information Details Patient Name: Date of Service: [place], [name] 04/22/2020 7:30 A M Medical Record Number: [id number] Patient Account Number: [id number] Date of Birth/Sex: Treating RN: 1986/05/07 (34 y.o. [name] Primary Care Abbey Veith: [place], [place] Other Clinician: Referring Jelani Vreeland: Treating Lenzie Sandler/Extender: [name]: 224 Encounter Discharge Information Items Discharge Condition: Stable Ambulatory Status: Wheelchair Discharge Destination: Home Transportation: Other Accompanied By: self Schedule Follow-up Appointment: Yes Clinical Summary of Care: Patient Declined Electronic Signature(s) Signed: 04/22/2020 5:56:12 PM By: Deon Pilling Entered By: Deon Pilling on 04/22/2020 08:12:48 -------------------------------------------------------------------------------- Patient/Caregiver Education Details Patient  Name: Date of Service: Bain, [name] 6/1/2021andnbsp7:30 A M Medical Record Number: [id number] Patient Account Number: [id number] Date of Birth/Gender: Treating RN: 30-May-1986 (34 y.o. [name] Primary Care Physician: Janine Limbo Other Clinician: Referring Physician: Treating Physician/Extender: [name]: 41 Education Assessment Education Provided To: Patient Education Topics Provided Wound/Skin Impairment: Handouts: Caring for Your Ulcer Methods: Explain/Verbal Responses: State content correctly Electronic Signature(s) Signed: 04/22/2020 5:56:12 PM By: Deon Pilling Entered By: Deon Pilling on 04/22/2020 08:12:29 -------------------------------------------------------------------------------- Wound Assessment Details Patient Name: Date of Service: [place], [name] 04/22/2020 7:30 A M Medical Record Number: [id number] Patient Account  Number: [id number] Date of Birth/Sex: Treating RN: [birth year] (34 y.o. [name] Primary Care Rohan Juenger: [name], [name] Other Clinician: Referring Claudy Abdallah: Treating Kamalei Roeder/Extender: [name]: 224 Wound Status Wound Number: 24 Primary Etiology: Inflammatory Wound Location: Left, Dorsal Foot Wound Status: Open Wounding Event: Gradually Appeared Comorbid History: Sleep Apnea, Hypertension, Paraplegia Date Acquired: 03/08/2018 Weeks Of Treatment: 110 Clustered Wound: Yes Wound Measurements Length: (cm) 0.5 Width: (cm) 1.1 Depth: (cm) 0.2 Clustered Quantity: 1 Area: (cm) 0.432 Volume: (cm) 0.086 % Reduction in Area: -83.1% % Reduction in Volume: -258.3% Epithelialization: Large (67-100%) Tunneling: No Undermining: No Wound Description Classification: Full Thickness Without Exposed Support Structures Wound Margin: Flat and Intact Exudate Amount: Medium Exudate Type: Serosanguineous Exudate Color: red, brown Foul Odor After Cleansing:  No Slough/Fibrino Yes Wound Bed Granulation Amount: Large (67-100%) Exposed Structure Granulation Quality: Red Fascia Exposed: No Necrotic Amount: Small (1-33%) Fat Layer (Subcutaneous Tissue) Exposed: Yes Necrotic Quality: Adherent Slough Tendon Exposed: No Muscle Exposed: No Joint Exposed: No Bone Exposed: No Treatment Notes Wound #24 (Left, Dorsal Foot) 1. Cleanse With Wound Cleanser Soap and water 2. Periwound Care Antifungal cream Barrier cream Moisturizing lotion TCA Cream 3. Primary Dressing Applied Hydrofera Blue Iodoflex 4. Secondary Dressing Dry Gauze 6. Support Layer Applied 4 layer compression wrap Notes hydrofera to malleolus and idoflex to foot Electronic Signature(s) Signed: 04/22/2020 5:56:12 PM By: Deon Pilling Entered By: Deon Pilling on 04/22/2020 08:07:14 -------------------------------------------------------------------------------- Wound Assessment Details Patient Name: Date of Service: [place], [name] 04/22/2020 7:30 A M Medical Record Number: [id number] Patient Account Number: [id number] Date of Birth/Sex: Treating RN: 09/02/1986 (34 y.o. [name] Primary Care Yesenia Locurto: [name], [name] Other Clinician: Referring Aydon Swamy: Treating Raenette Sakata/Extender: [name]: 224 Wound Status Wound Number: 37 Primary Etiology: Venous Leg Ulcer Wound Location: Left, Lateral Malleolus Wound Status: Open Wounding Event: Gradually Appeared Comorbid History: Sleep Apnea, Hypertension, Paraplegia Date Acquired: 11/26/2019 Weeks Of Treatment: 21 Clustered Wound: No Wound Measurements Length: (cm) 2 Width: (cm) 0.7 Depth: (cm) 0.1 Area: (cm) 1.1 Volume: (cm) 0.11 % Reduction in Area: 72.2% % Reduction in Volume: 72.2% Epithelialization: Large (67-100%) Tunneling: No Undermining: No Wound Description Classification: Full Thickness Without Exposed Support Structures Wound Margin: Flat and Intact Exudate  Amount: Medium Exudate Type: Serosanguineous Exudate Color: red, brown Foul Odor After Cleansing: No Slough/Fibrino No Wound Bed Granulation Amount: Large (67-100%) Exposed Structure Granulation Quality: Red Fascia Exposed: No Necrotic Amount: None Present (0%) Fat Layer (Subcutaneous Tissue) Exposed: Yes Tendon Exposed: No Muscle Exposed: No Joint Exposed: No Bone Exposed: No Treatment Notes Wound #37 (Left, Lateral Malleolus) 1. Cleanse With Wound Cleanser Soap and water 2. Periwound Care Antifungal cream Barrier cream Moisturizing lotion TCA Cream 3. Primary Dressing Applied Hydrofera Blue Iodoflex 4. Secondary Dressing Dry Gauze 6. Support Layer Applied 4 layer compression wrap Notes hydrofera to malleolus and idoflex to foot Electronic Signature(s) Signed: 04/22/2020 5:56:12 PM By: Deon Pilling Entered By: Deon Pilling on 04/22/2020 08:07:30 -------------------------------------------------------------------------------- Wound Assessment Details Patient Name: Date of Service: [place], [name] 04/22/2020 7:30 A M Medical Record Number: [id number] Patient Account Number: [id number] Date of Birth/Sex: Treating RN: December 12, 1985 (34 y.o. [name] Primary Care Margie Brink: [name], [name] Other Clinician: Referring Josefina Rynders: Treating Alixandria Friedt/Extender: [name]: 224 Wound Status Wound Number: 38 Primary Etiology: Inflammatory Wound Location: Right T - Web between 1st and 2nd oe Wound Status: Open Wounding Event: Gradually Appeared Comorbid History: Sleep Apnea, Hypertension, Paraplegia  Date Acquired: 11/30/2019 Weeks Of Treatment: 20 Clustered Wound: No Wound Measurements Length: (cm) 0.4 Width: (cm) 0.2 Depth: (cm) 0.2 Area: (cm) 0.063 Volume: (cm) 0.013 % Reduction in Area: 80.9% % Reduction in Volume: 94.4% Epithelialization: Medium (34-66%) Tunneling: No Undermining: No Wound Description Classification: Full  Thickness Without Exposed Support Structures Wound Margin: Distinct, outline attached Exudate Amount: Medium Exudate Type: Serosanguineous Exudate Color: red, brown Foul Odor After Cleansing: No Slough/Fibrino Yes Wound Bed Granulation Amount: Large (67-100%) Exposed Structure Granulation Quality: Red Fat Layer (Subcutaneous Tissue) Exposed: Yes Necrotic Amount: Small (1-33%) Necrotic Quality: Adherent Slough Treatment Notes Wound #38 (Right Toe - Web between 1st and 2nd) 1. Cleanse With Wound Cleanser 2. Periwound Care Antifungal cream Moisturizing lotion 3. Primary Dressing Applied Calcium Alginate Ag 4. Secondary Dressing Dry Gauze Roll Gauze 5. Secured With Recruitment consultant) Signed: 04/22/2020 5:56:12 PM By: Deon Pilling Entered By: Deon Pilling on 04/22/2020 08:07:47 -------------------------------------------------------------------------------- Wound Assessment Details Patient Name: Date of Service: [place], [name] 04/22/2020 7:30 A M Medical Record Number: [id number] Patient Account Number: [id number] Date of Birth/Sex: Treating RN: June 07, 1986 (34 y.o. [name] Primary Care Jennie Bolar: [name], [name] Other Clinician: Referring Anikin Prosser: Treating Spyros Winch/Extender: [name]: 224 Wound Status Wound Number: 41 Primary Etiology: Pressure Ulcer Wound Location: Left Ischium Wound Status: Open Wounding Event: Gradually Appeared Comorbid History: Sleep Apnea, Hypertension, Paraplegia Date Acquired: 03/16/2020 Weeks Of Treatment: 5 Clustered Wound: No Wound Measurements Length: (cm) 1 Width: (cm) 0.8 Depth: (cm) 0.1 Area: (cm) 0.628 Volume: (cm) 0.063 % Reduction in Area: 65.2% % Reduction in Volume: 65.2% Epithelialization: Medium (34-66%) Tunneling: No Undermining: No Wound Description Classification: Category/Stage II Wound Margin: Distinct, outline attached Exudate Amount: Small Exudate Type:  Serosanguineous Exudate Color: red, brown Foul Odor After Cleansing: No Slough/Fibrino No Wound Bed Granulation Amount: Large (67-100%) Exposed Structure Granulation Quality: Pink Fascia Exposed: No Necrotic Amount: None Present (0%) Fat Layer (Subcutaneous Tissue) Exposed: Yes Tendon Exposed: No Muscle Exposed: No Joint Exposed: No Bone Exposed: No Treatment Notes 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 Cleanse With Wound Cleanser 2. Periwound Care Skin Prep 3. Primary Dressing Applied Calcium Alginate Ag 4. Secondary Dressing Foam Border Dressing Electronic Signature(s) Signed: 04/22/2020 5:56:12 PM By: Deon Pilling Entered By: Deon Pilling on 04/22/2020 08:08:04 -------------------------------------------------------------------------------- Vitals Details Patient Name: Date of Service: [place], [name] 04/22/2020 7:30 A M Medical Record Number: [id number] Patient Account Number: [id number] Date of Birth/Sex: Treating RN: [birth year] (34 y.o. [name] Primary Care [name] Sunga: [place], [place] Other Clinician: Referring Equan Cogbill: Treating Lynia Landry/Extender: [place], [name]: 224 Vital Signs Time Taken: 08:00 Temperature (F): 98.2 Height (in): 70 Pulse (bpm): 127 Weight (lbs): 216 Respiratory Rate (breaths/min): 20 Body Mass Index (BMI): 31 Blood Pressure (mmHg): 135/80 Reference Range: 80 - 120 mg / dl Electronic Signature(s) Signed: 04/22/2020 5:56:12 PM By: Deon Pilling Entered By: Deon Pilling on 04/22/2020 08:05:39

## 2020-04-24 NOTE — Progress Notes (Signed)
[name], [name] ([name]) Visit Report for 04/14/2020 Arrival Information Details Patient Name: Date of Service: [name], [name] 04/14/2020 7:30 [name]: [name] Patient Account [name]: [id number] Date of Birth/Sex: Treating RN: 07-Dec-1985 (34 y.o. [name] Primary Care Shavell Nored: [name], [name] Other Clinician: Referring Maleena Eddleman: Treating Nemesio Castrillon/Extender: Shyrl Numbers, [name]: 59 Visit Information History Since Last Visit Added or deleted any medications: No Patient Arrived: Wheel Chair Any new allergies or adverse reactions: No Arrival Time: 07:50 Had [name] fall or experienced change in No Accompanied By: self activities of daily living that may affect Transfer Assistance: None risk of falls: Patient Identification Verified: Yes Signs or symptoms of abuse/neglect since last visito No Secondary Verification Process Completed: Yes Hospitalized since last visit: No Patient Requires Transmission-Based Precautions: No Implantable device outside of the clinic excluding No Patient Has Alerts: Yes cellular tissue based products placed in the center Patient Alerts: R ABI = 1.0 since last visit: L ABI = 1.1 Has Dressing in Place as Prescribed: Yes Pain Present Now: No Electronic Signature(s) Signed: 04/24/2020 9:13:31 [name] By: Sandre Kitty Entered By: Sandre Kitty on 04/14/2020 07:51:44 -------------------------------------------------------------------------------- Compression Therapy Details Patient Name: Date of Service: [name], [name] 04/14/2020 7:30 [name]: [name] Patient Account [name]: [id number] Date of Birth/Sex: Treating RN: 1986/03/08 (34 y.o. [name] Primary Care Marybelle Giraldo: [name], [name] Other Clinician: Referring Vinton Layson: Treating Avry Roedl/Extender: Shyrl Numbers, [name]: 223 Compression Therapy Performed for Wound Assessment: Wound #37 Left,Lateral  Malleolus Performed By: Clinician Levan Hurst, RN Compression Type: Four Layer Post Procedure Diagnosis Same as Pre-procedure Electronic Signature(s) Signed: 04/14/2020 5:12:22 PM By: Levan Hurst RN, BSN Entered By: Levan Hurst on 04/14/2020 08:27:41 -------------------------------------------------------------------------------- Encounter Discharge Information Details Patient Name: Date of Service: [name], [name] 04/14/2020 7:30 [name]: [name] Patient Account [name]: [id number] Date of Birth/Sex: Treating RN: 20-Mar-1986 (34 y.o. [name] Primary Care Cougar Imel: [name], [name] Other Clinician: Referring Alister Staver: Treating Jaquann Guarisco/Extender: Glenda Chroman [name]: 223 Encounter Discharge Information Items Discharge Condition: Stable Ambulatory Status: Wheelchair Discharge Destination: Home Transportation: Private Auto Accompanied By: self Schedule Follow-up Appointment: Yes Clinical Summary of Care: Electronic Signature(s) Signed: 04/14/2020 4:00:55 PM By: Deon Pilling Entered By: Deon Pilling on 04/14/2020 08:47:15 -------------------------------------------------------------------------------- Lower Extremity Assessment Details Patient Name: Date of Service: [name], [name] 04/14/2020 7:30 [name]: [name] Patient Account [name]: [id number] Date of Birth/Sex: Treating RN: 1986-07-01 (34 y.o. [name] Primary Care Mera Gunkel: [name], [name] Other Clinician: Referring Wake Conlee: Treating Tillie Viverette/Extender: Shyrl Numbers, [name]: 223 Edema Assessment Assessed: [Left: Yes] [Right: Yes] Edema: [Left: No] [Right: Yes] Calf Left: Right: Point of Measurement: 33 cm From Medial Instep 26 cm 32 cm Ankle Left: Right: Point of Measurement: 10 cm From Medial Instep 23 cm 26 cm Vascular Assessment Pulses: Dorsalis Pedis Palpable: [Left:Yes]  [Right:Yes] Electronic Signature(s) Signed: 04/14/2020 4:00:55 PM By: Deon Pilling Entered By: Deon Pilling on 04/14/2020 08:10:55 -------------------------------------------------------------------------------- [place] Details Patient Name: Date of Service: [name], [name] 04/14/2020 7:30 [name]: [name] Patient Account [name]: [id number] Date of Birth/Sex: Treating RN: [birth year] (34 y.o. [name] Primary Care Elaya Droege: [name], [name] Other Clinician: Referring Labradford Schnitker: Treating Gweneth Fredlund/Extender: Shyrl Numbers, [name]: 223 Active Inactive Wound/Skin Impairment Nursing Diagnoses: Impaired tissue integrity Knowledge deficit related to ulceration/compromised skin integrity Goals: Patient/caregiver will verbalize understanding of  skin care regimen Date Initiated: 01/05/2016 Target Resolution Date: 05/16/2020 Goal Status: Active Ulcer/skin breakdown will have [name] volume reduction of 30% by week 4 Date Initiated: 01/05/2016 Date Inactivated: 12/22/2017 Target Resolution Date: 01/19/2018 Unmet Reason: complex wounds, Goal Status: Unmet infection Interventions: Assess patient/caregiver ability to obtain necessary supplies Assess ulceration(s) every visit Provide education on ulcer and skin care Notes: Electronic Signature(s) Signed: 04/14/2020 5:12:22 PM By: Levan Hurst RN, BSN Entered By: Levan Hurst on 04/14/2020 10:19:07 -------------------------------------------------------------------------------- Pain Assessment Details Patient Name: Date of Service: [name], [name] 04/14/2020 7:30 [name]: [name] Patient Account [name]: [id number] Date of Birth/Sex: Treating RN: 1986/11/07 (34 y.o. [name] Primary Care Helder Crisafulli: [place], [place] Other Clinician: Referring Alicen Donalson: Treating Ohanna Gassert/Extender: Shyrl Numbers, [name]: 223 Active  Problems Location of Pain Severity and Description of Pain Patient Has Paino No Site Locations Pain Management and Medication Current Pain Management: Electronic Signature(s) Signed: 04/14/2020 5:12:22 PM By: Levan Hurst RN, BSN Signed: 04/24/2020 9:13:31 [name] By: Sandre Kitty Entered By: Sandre Kitty on 04/14/2020 07:52:11 -------------------------------------------------------------------------------- Patient/Caregiver Education Details Patient Name: Date of Service: [name], [name] 5/24/2021andnbsp7:30 [name]: [name] Patient Account [name]: [id number] Date of Birth/Gender: Treating RN: [birth year] (34 y.o. [name] Primary Care Physician: Janine Limbo Other Clinician: Referring Physician: Treating Physician/Extender: Glenda Chroman [name]: 4 Education Assessment Education Provided To: Patient Education Topics Provided Wound/Skin Impairment: Methods: Explain/Verbal Responses: State content correctly Electronic Signature(s) Signed: 04/14/2020 5:12:22 PM By: Levan Hurst RN, BSN Entered By: Levan Hurst on 04/14/2020 10:19:31 -------------------------------------------------------------------------------- Wound Assessment Details Patient Name: Date of Service: [name], [name] 04/14/2020 7:30 [name]: [name] Patient Account [name]: [id number] Date of Birth/Sex: Treating RN: May 13, 1986 (34 y.o. [name] Primary Care Natividad Halls: [name], [name] Other Clinician: Referring Deisy Ozbun: Treating Jaben Benegas/Extender: Shyrl Numbers, [name]: 223 Wound Status Wound [name]: 24 Primary Etiology: Inflammatory Wound Location: Left, Dorsal Foot Wound Status: Open Wounding Event: Gradually Appeared Comorbid History: Sleep Apnea, Hypertension, Paraplegia Date Acquired: 03/08/2018 Weeks Of Treatment: 109 Clustered Wound: Yes Photos Photo Uploaded By: Mikeal Hawthorne on 04/15/2020 13:56:01 Wound Measurements Length: (cm) 0.5 Width: (cm) 1.1 Depth: (cm) 0.2 Clustered Quantity: 1 Area: (cm) 0. Volume: (cm) 0. % Reduction in Area: -83.1% % Reduction in Volume: -258.3% Epithelialization: Large (67-100%) Tunneling: No 432 Undermining: No 086 Wound Description Classification: Full Thickness Without Exposed Support Structures Wound Margin: Flat and Intact Exudate Amount: Medium Exudate Type: Serosanguineous Exudate Color: red, brown Foul Odor After Cleansing: No Slough/Fibrino Yes Wound Bed Granulation Amount: Large (67-100%) Exposed Structure Granulation Quality: Red Fascia Exposed: No Necrotic Amount: Small (1-33%) Fat Layer (Subcutaneous Tissue) Exposed: Yes Necrotic Quality: Adherent Slough Tendon Exposed: No Muscle Exposed: No Joint Exposed: No Bone Exposed: No Electronic Signature(s) Signed: 04/14/2020 4:00:55 PM By: Deon Pilling Signed: 04/14/2020 5:12:22 PM By: Levan Hurst RN, BSN Entered By: Deon Pilling on 04/14/2020 08:11:22 -------------------------------------------------------------------------------- Wound Assessment Details Patient Name: Date of Service: [name], [name] 04/14/2020 7:30 [name]: [name] Patient Account [name]: [id number] Date of Birth/Sex: Treating RN: Jul 14, 1986 (34 y.o. [name] Primary Care Aalaysia Liggins: [place], [name] Other Clinician: Referring Tieshia Rettinger: Treating Rodger Giangregorio/Extender: Shyrl Numbers, [name]: 223 Wound Status Wound [name]: 37 Primary Etiology: Venous Leg Ulcer Wound Location: Left, Lateral Malleolus Wound Status: Open Wounding Event: Gradually Appeared Comorbid History: Sleep Apnea, Hypertension, Paraplegia Date Acquired: 11/26/2019 Weeks Of Treatment: 20  Clustered Wound: No Photos Photo Uploaded By: Mikeal Hawthorne on 04/15/2020 13:55:37 Wound Measurements Length: (cm) 2 Width: (cm) 0.7 Depth: (cm) 0.1 Area:  (cm) 1.1 Volume: (cm) 0.11 % Reduction in Area: 72.2% % Reduction in Volume: 72.2% Epithelialization: Large (67-100%) Tunneling: No Undermining: No Wound Description Classification: Full Thickness Without Exposed Support Structures Wound Margin: Flat and Intact Exudate Amount: Medium Exudate Type: Serosanguineous Exudate Color: red, brown Foul Odor After Cleansing: No Slough/Fibrino No Wound Bed Granulation Amount: Large (67-100%) Exposed Structure Granulation Quality: Red Fascia Exposed: No Necrotic Amount: None Present (0%) Fat Layer (Subcutaneous Tissue) Exposed: Yes Tendon Exposed: No Muscle Exposed: No Joint Exposed: No Bone Exposed: No Electronic Signature(s) Signed: 04/14/2020 4:00:55 PM By: Deon Pilling Signed: 04/14/2020 5:12:22 PM By: Levan Hurst RN, BSN Entered By: Deon Pilling on 04/14/2020 08:12:11 -------------------------------------------------------------------------------- Wound Assessment Details Patient Name: Date of Service: [name], [name] 04/14/2020 7:30 [name]: [name] Patient Account [name]: [id number] Date of Birth/Sex: Treating RN: Aug 27, 1986 (34 y.o. [name] Primary Care Massiah Longanecker: [name], [name] Other Clinician: Referring Derian Dimalanta: Treating Jonet Mathies/Extender: Shyrl Numbers, [name]: 223 Wound Status Wound [name]: 38 Primary Etiology: Inflammatory Wound Location: Right T - Web between 1st and 2nd oe Wound Status: Open Wounding Event: Gradually Appeared Comorbid History: Sleep Apnea, Hypertension, Paraplegia Date Acquired: 11/30/2019 Weeks Of Treatment: 19 Clustered Wound: No Photos Photo Uploaded By: Mikeal Hawthorne on 04/15/2020 13:56:39 Wound Measurements Length: (cm) 0.4 Width: (cm) 0.2 Depth: (cm) 0.2 Area: (cm) 0.063 Volume: (cm) 0.013 % Reduction in Area: 80.9% % Reduction in Volume: 94.4% Epithelialization: Medium (34-66%) Tunneling: No Undermining: No Wound  Description Classification: Full Thickness Without Exposed Support Structures Wound Margin: Distinct, outline attached Exudate Amount: Medium Exudate Type: Serosanguineous Exudate Color: red, brown Foul Odor After Cleansing: No Slough/Fibrino Yes Wound Bed Granulation Amount: Large (67-100%) Exposed Structure Granulation Quality: Red Fat Layer (Subcutaneous Tissue) Exposed: Yes Necrotic Amount: Small (1-33%) Necrotic Quality: Adherent Therapist, music) Signed: 04/14/2020 4:00:55 PM By: Deon Pilling Signed: 04/14/2020 5:12:22 PM By: Levan Hurst RN, BSN Entered By: Deon Pilling on 04/14/2020 08:12:36 -------------------------------------------------------------------------------- Wound Assessment Details Patient Name: Date of Service: [name], [name] 04/14/2020 7:30 [name]: [name] Patient Account [name]: [id number] Date of Birth/Sex: Treating RN: 07/05/1986 (34 y.o. [name] Primary Care Aleynah Rocchio: [name], [name] Other Clinician: Referring Merlean Pizzini: Treating Kimeka Badour/Extender: Shyrl Numbers, [name]: 223 Wound Status Wound [name]: 41 Primary Etiology: Pressure Ulcer Wound Location: Left Ischium Wound Status: Open Wounding Event: Gradually Appeared Comorbid History: Sleep Apnea, Hypertension, Paraplegia Date Acquired: 03/16/2020 Weeks Of Treatment: 4 Clustered Wound: No Photos Photo Uploaded By: Mikeal Hawthorne on 04/15/2020 13:56:53 Wound Measurements Length: (cm) 1 Width: (cm) 0.8 Depth: (cm) 0.1 Area: (cm) 0.628 Volume: (cm) 0.063 % Reduction in Area: 65.2% % Reduction in Volume: 65.2% Epithelialization: Medium (34-66%) Tunneling: No Undermining: No Wound Description Classification: Category/Stage II Wound Margin: Distinct, outline attached Exudate Amount: Small Exudate Type: Serosanguineous Exudate Color: red, brown Foul Odor After Cleansing: No Slough/Fibrino No Wound  Bed Granulation Amount: Large (67-100%) Exposed Structure Granulation Quality: Pink Fascia Exposed: No Necrotic Amount: None Present (0%) Fat Layer (Subcutaneous Tissue) Exposed: Yes Tendon Exposed: No Muscle Exposed: No Joint Exposed: No Bone Exposed: No Electronic Signature(s) Signed: 04/14/2020 4:00:55 PM By: Deon Pilling Signed: 04/14/2020 5:12:22 PM By: Levan Hurst RN, BSN Entered By: Deon Pilling on 04/14/2020 08:13:08 -------------------------------------------------------------------------------- Vitals Details Patient Name: Date of Service: [place], [name] 04/14/2020 7:30  [name]: [name] Patient Account [name]: [id number] Date of Birth/Sex: Treating RN: 1986-05-02 (34 y.o. [name] Primary Care Minoru Chap: [place], [place] Other Clinician: Referring Raniah Karan: Treating Gayle Collard/Extender: Shyrl Numbers, [name]: 223 Vital Signs Time Taken: 07:51 Temperature (F): 98.2 Height (in): 70 Pulse (bpm): 96 Weight (lbs): 216 Respiratory Rate (breaths/min): 20 Body Mass Index (BMI): 31 Blood Pressure (mmHg): 112/67 Reference Range: 80 - 120 mg / dl Electronic Signature(s) Signed: 04/24/2020 9:13:31 [name] By: Sandre Kitty Entered By: Sandre Kitty on 04/14/2020 07:52:05

## 2020-04-28 ENCOUNTER — Encounter (HOSPITAL_BASED_OUTPATIENT_CLINIC_OR_DEPARTMENT_OTHER): Payer: BC Managed Care – PPO | Admitting: Internal Medicine

## 2020-04-28 DIAGNOSIS — L03115 Cellulitis of right lower limb: Secondary | ICD-10-CM | POA: Diagnosis not present

## 2020-04-28 DIAGNOSIS — Z8614 Personal history of Methicillin resistant Staphylococcus aureus infection: Secondary | ICD-10-CM | POA: Diagnosis not present

## 2020-04-28 DIAGNOSIS — L89623 Pressure ulcer of left heel, stage 3: Secondary | ICD-10-CM | POA: Diagnosis not present

## 2020-04-28 DIAGNOSIS — L97511 Non-pressure chronic ulcer of other part of right foot limited to breakdown of skin: Secondary | ICD-10-CM | POA: Diagnosis not present

## 2020-04-28 DIAGNOSIS — L97322 Non-pressure chronic ulcer of left 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: Secondary | ICD-10-CM | POA: Diagnosis not present

## 2020-04-28 DIAGNOSIS — Z87440 Personal history of urinary (tract) infections: Secondary | ICD-10-CM | POA: Diagnosis not present

## 2020-04-28 DIAGNOSIS — I872 Venous insufficiency (chronic) (peripheral): Secondary | ICD-10-CM | POA: Diagnosis not present

## 2020-04-28 DIAGNOSIS — L97521 Non-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: Secondary | ICD-10-CM | POA: Diagnosis not present

## 2020-04-28 DIAGNOSIS — Z9081 Acquired absence of spleen: Secondary | ICD-10-CM | POA: Diagnosis not present

## 2020-04-28 DIAGNOSIS — G8221 Paraplegia, complete: Secondary | ICD-10-CM | POA: Diagnosis not present

## 2020-04-28 DIAGNOSIS — L89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, stage 3: Secondary | ICD-10-CM | POA: Diagnosis not present

## 2020-04-28 DIAGNOSIS — L97321 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 to breakdown of skin: Secondary | ICD-10-CM | POA: Diagnosis not present

## 2020-04-28 DIAGNOSIS — I10 Essential (primary) hypertension: Secondary | ICD-10-CM | POA: Diagnosis not present

## 2020-04-28 DIAGNOSIS — I890 Lymphedema, not elsewhere classified: Secondary | ICD-10-CM | POA: Diagnosis not present

## 2020-04-28 NOTE — Progress Notes (Signed)
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 over the surface of the wound albeit not coming from the wound circumference. This is a bit of an odd situation in which the epithelialization seems to be coming from the surface of the wound rather than in the exact circumference. There is still small open areas mostly along the lateral margin of the wound. He has unchanged areas between the left first and second and the right first second toes which I been treating for tenia pedis 06/02/17; left lateral leg venous insufficiency wound. We have been using Hydrofera Blue. Somewhat smaller from the wound circumference. The surface of the wound remains a bit on it almost epithelialized sedation in appearance. I use an open curette today debridement in the surface of all of this especially the edges Small open wounds remaining on the dorsal right first and second toe interspace and the plantar left first second toe and her face on the left 06/09/17; wound on the left lateral leg continues to be smaller but very gradual and very dry surface using Hydrofera Blue 06/16/17 requires weekly debridements now on the left lateral leg although this continues to  contract. I changed to silver collagen last week because of dryness of the wound bed. Using  Iodoflex to the areas on his first and second toes/web space bilaterally 06/24/17; patient with history of paraplegia also chronic venous insufficiency with lymphedema. Has a very difficult wound on the left lateral leg. This has been gradually reducing in terms of with but comes in with a very dry adherent surface. High switch to silver collagen a week or so ago with hydrogel to keep the area moist. This is been refractory to multiple dressing attempts. He also has areas in his first and second toes bilaterally in the anterior and posterior web space. I had been using Iodoflex here after a prolonged course of silver alginate with ketoconazole was ineffective [question tinea pedis] 07/14/17; patient arrives today with a very difficult adherent material over his left lateral lower leg wound. He also has surrounding erythema and poorly controlled edema. He was switched his Santyl last visit which the nurses are applying once during his doctor visit and once on a nurse visit. He was also reduced to 2 layer compression I'm not exactly sure of the issue here. 07/21/17; better surface today after 1 week of Iodoflex. Significant cellulitis that we treated last week also better. [Doxycycline] 07/28/17 better surface today with now 2 weeks of Iodoflex. Significant cellulitis treated with doxycycline. He has now completed the doxycycline and he is back to his usual degree of chronic venous inflammation/stasis dermatitis. He reminds me he has had ablations surgery here 08/04/17; continued improvement with Iodoflex to the left lateral leg wound in terms of the surface of the wound although the dimensions are better. 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 Problematic areas on the plantar aspect of the first second toe web space on the left and the dorsal  aspect of the first second toe web space on the right. At one point I felt these were probably related to chronic fungal infections in treated him aggressively for this although we have not made any improvement here. 08/11/17; left lateral leg. Surface continues to improve with the Iodoflex although we are not seeing much improvement in overall wound dimensions. Areas on his plantar left foot and right foot show no improvement. In fact the right foot looks somewhat worse 08/18/17; left lateral leg. We changed to [facility] Blue last week after a prolonged course of Iodoflex which helps get the surface better. It appears that the wound with is improved. Continue with difficult areas on the left dorsal first second and plantar first second on the right 09/01/17; patient arrives in clinic today having had a temperature of 103 yesterday. He was seen in the ER and [facility]. The patient was concerned he could have cellulitis again in the right leg however they diagnosed him with a UTI and he is now on Keflex. He has a history of cellulitis which is been recurrent and difficult but this is been in the left leg, in the past 5 use doxycycline. He does in and out catheterizations at home which are risk factors for UTI 09/08/17; patient will be completing his Keflex this weekend. The erythema on the left leg is considerably better. He has a new wound today on the medial part of the right leg small superficial almost looks like a skin tear. He has worsening of the area on the right dorsal first and second toe. His major area on the left lateral leg is better. Using Hydrofera Blue on all areas 09/15/17; gradual reduction in width on the long wound in the left  lateral leg. No debridement required. He also has wounds on the plantar aspect of his left first second toe web space and on the dorsal aspect of the right first second toe web space. 09/22/17; there continues to be very gradual improvements in the dimensions of  the left lateral leg wound. He hasn't round erythematous spot with might be pressure on his wheelchair. There is no evidence obviously of infection no purulence no warmth He has a dry scaled area on the plantar aspect of the left first second toe Improved area on the dorsal right first second toe. 09/29/17; left lateral leg wound continues to improve in dimensions mostly with an is still a fairly long but increasingly narrow wound. He has a dry scaled area on the plantar aspect of his left first second toe web space Increasingly concerning area on the dorsal right first second toe. In fact I am concerned today about possible cellulitis around this wound. The areas extending up his second toe and although there is deformities here almost appears to abut on the nailbed. 10/06/17; left lateral leg wound continues to make very gradual progress. Tissue culture I did from the right first second toe dorsal foot last time grew MRSA and enterococcus which was vancomycin sensitive. This was not sensitive to clindamycin or doxycycline. He is allergic to Zyvox and sulfa we have therefore arrange for him to have dalvance infusion tomorrow. He is had this in the past and tolerated it well 10/20/17; left lateral leg wound continues to make decent progress. This is certainly reduced in terms of with there is advancing epithelialization.The cellulitis in the right foot looks better although he still has a deep wound in the dorsal aspect of the first second toe web space. Plantar left first toe web space on the left I think is making some progress 10/27/17; left lateral leg wound continues to make decent progress. Advancing epithelialization.using Hydrofera Blue The right first second toe web space wound is better-looking using silver alginate Improvement in the left plantar first second toe web space. Again using silver alginate 11/03/17 left lateral leg wound continues to make decent progress albeit slowly. Using  [facility] The right per second toe web space continues to be a very problematic looking punched out wound. I obtained a piece of tissue for deep culture I did extensively treated this for fungus. It is difficult to imagine that this is a pressure area as the patient states other than going outside he doesn't really wear shoes at home The left plantar first second toe web space looked fairly senescent. Necrotic edges. This required debridement change to [facility] Blue to all wound areas 11/10/17; left lateral leg wound continues to contract. Using Hydrofera Blue On the right dorsal first second toe web space dorsally. Culture I did of this area last week grew MRSA there is not an easy oral option in this patient was multiple antibiotic allergies or intolerances. This was only a rare culture isolate I'm therefore going to use Bactroban under silver alginate On the left plantar first second toe web space. Debridement is required here. This is also unchanged 11/17/17; left lateral leg wound continues to contract using Hydrofera Blue this is no longer the major issue. The major concern here is the right first second toe web space. He now has an open area going from dorsally to the plantar aspect. There is now wound on the inner lateral part of the first toe. Not a very viable surface on this. There is erythema  spreading medially into the forefoot. No major change in the left first second toe plantar wound 11/24/17; left lateral leg wound continues to contract using Hydrofera Blue. Nice improvement today The right first second toe web space all of this looks a lot less angry than last week. I have given him clindamycin and topical Bactroban for MRSA and terbinafine for the possibility of underlining tinea pedis that I could not control with ketoconazole. Looks somewhat better The area on the plantar left first second toe web space is weeping with dried debris around the wound 12/01/17; left lateral leg  wound continues to contract he Hydrofera Blue. It is becoming thinner in terms of with nevertheless it is making good improvement. The right first second toe web space looks less angry but still a large necrotic-looking wounds starting on the plantar aspect of the right foot extending between the toes and now extensively on the base of the right second toe. I gave him clindamycin and topical Bactroban for MRSA anterior benefiting for the possibility of underlying tinea pedis. Not looking better today The area on the left first/second toe looks better. Debrided of necrotic debris 12/05/17* the patient was worked in urgently today because over the weekend he found blood on his incontinence bad when he woke up. He was found to have an ulcer by his wife who does most of his wound care. He came in today for [place] to look at this. He has not had a history of wounds in his buttocks in spite of his paraplegia. 12/08/17; seen in follow-up today at his usual appointment. He was seen earlier this week and found to have a new wound on his buttock. We also follow him for wounds on the left lateral leg, left first second toe web space and right first second toe web space 12/15/17; we have been using Hydrofera Blue to the left lateral leg which has improved. The right first second toe web space has also improved. Left first second toe web space plantar aspect looks stable. The left buttock has worsened using Santyl. Apparently the buttock has drainage 12/22/17; we have been using Hydrofera Blue to the left lateral leg which continues to improve now 2 small wounds separated by normal skin. He tells [place] he had a fever up to 100 yesterday he is prone to UTIs but has not noted anything different. He does in and out catheterizations. The area between the first and second toes today does not look good necrotic surface covered with what looks to be purulent drainage and erythema extending into the third toe. I had gotten this to  something that I thought look better last time however it is not look good today. He also has a necrotic surface over the buttock wound which is expanded. I thought there might be infection under here so I removed a lot of the surface with a #5 curet though nothing look like it really needed culturing. He is been using Santyl to this area 12/27/17; his original wound on the left lateral leg continues to improve using Hydrofera Blue. I gave him samples of Baxdella although he was unable to take them out of fear for an allergic reaction ["lump in his throat"].the culture I did of the purulent drainage from his second toe last week showed both enterococcus and a set Enterobacter I was also concerned about the erythema on the bottom of his foot although paradoxically although this looks somewhat better today. 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 We've been using silver alginate here. They came up today that he sleeps in a recliner, I'm not sure why but I asked him to stop this 01/03/18; his original wound we've been using Hydrofera Blue is now separated into 2 areas. Ulcer on his left buttock is better he is off the recliner and sleeping in bed Finally both wound areas between his first and second toes also looks some better 01/10/18; his original wound on the left lateral leg is now separated into 2 wounds we've been using Hydrofera Blue Ulcer on his left buttock has some drainage. There is a small probing site going into muscle layer superiorly.using silver alginate -He arrives today with a deep tissue injury on the left heel The wound on the dorsal aspect of his first second toe on the left looks a lot betterusing silver alginate ketoconazole The area on the first second toe web space on the right also looks a lot bette 01/17/18; his original wound on the left lateral leg continues to progress using  Hydrofera Blue Ulcer on his left buttock also is smaller surface healthier except for a small probing site going into the muscle layer superiorly. 2.4 cm of tunneling in this area DTI on his left heel we have only been offloading. Looks better than last week no threatened open no evidence of infection the wound on the dorsal aspect of the first second toe on the left continues to look like it's regressing we have only been using silver alginate and terbinafine orally The area in the first second toe web space on the right also looks to be a lot better using silver alginate and terbinafine I think this was prompted by tinea pedis 01/31/18; the patient was hospitalized in [place] last week apparently for a complicated UTI. He was discharged on cefepime he does in and out catheterizations. In the hospital he was discovered M I don't mild elevation of AST and ALT and the terbinafine was stopped.predictably the pressure ulcer on s his buttock looks betterusing silver alginate. The area on the left lateral leg also is better using Hydrofera Blue. The area between the first and second toes on the left better. First and second toes on the right still substantial but better. Finally the DTI on the left heel has held together and looks like it's resolving 02/07/18-he is here in follow-up evaluation for multiple ulcerations. He has new injury to the lateral aspect of the last issue a pressure ulcer, he states this is from adhesive removal trauma. He states he has tried multiple adhesive products with no success. All other ulcers appear stable. The left heel DTI is resolving. We will continue with same treatment plan and follow-up next week. 02/14/18; follow-up for multiple areas. He has a new area last week on the lateral aspect of his pressure ulcer more over the posterior trochanter. The original pressure ulcer looks quite stable has healthy granulation. 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 Almost fully epithelialized on the original superior area using Hydrofera Blue DTI on the left heel has peeled off this week to reveal a small superficial wound under denuded skin and subcutaneous tissue Both areas between the first and second toes look better including nothing open on the left 02/21/18; The patient's wounds on his left ischial tuberosity and posterior left greater trochanter actually  looked better. He has a large area of irritation around the area which I think is contact dermatitis. I am doubtful that this is fungal His original wound on the left lateral calf continues to improve we have been using Hydrofera Blue There is no open area in the left first second toe web space although there is a lot of thick callus The DTI on the left heel required debridement today of necrotic surface eschar and subcutaneous tissue using silver alginate Finally the area on the right first second toe webspace continues to contract using silver alginate and ketoconazole 02/28/18 Left ischial tuberosity wounds look better using silver alginate. Original wound on the left calf only has one small open area left using Hydrofera Blue DTI on the left heel required debridement mostly removing skin from around this wound surface. Using silver alginate The areas on the right first/second toe web space using silver alginate and ketoconazole 03/08/18 on evaluation today patient appears to be doing decently well as best I can tell in regard to his wounds. This is the first time that I have seen him as he generally is followed by Dr. Dellia Nims. With that being said none of his wounds appear to be infected he does have an area where there is some skin covering what appears to be a new wound on the left dorsal surface of his great toe. This is right at the nail bed. With that being said I do believe that debrided away some of  the excess skin can be of benefit in this regard. Otherwise he has been tolerating the dressing changes without complication. 03/14/18; patient arrives today with the multiplicity of wounds that we are following. He has not been systemically unwell Original wound on the left lateral calf now only has 2 small open areas we've been using Hydrofera Blue which should continue The deep tissue injury on the left heel requires debridement today. We've been using silver alginate The left first second toe and the right first second toe are both are reminiscence what I think was tinea pedis. Apparently some of the callus Surface between the toes was removed last week when it started draining. Purulent drainage coming from the wound on the ischial tuberosity on the left. 03/21/18-He is here in follow-up evaluation for multiple wounds. 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 He tolerated debridement. The only change to last week's recommendations is to discontinue antifungal cream between toes. He will follow-up next week 03/28/18; following up for multiple wounds;Concern this week is streaking redness and swelling in the right foot. He is going to need antibiotics for this. 03/31/18; follow-up for right foot cellulitis. Streaking redness and swelling in the right foot on 03/28/18. 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 I put him on clindamycin 300 mg every 6 and brought him in for a quick check. He has an open wound between his first and second toes on the right foot as a potential source. 04/04/18; Right foot cellulitis is resolving he is completing clindamycin. This is truly good news Left lateral calf wound which is initial wound only has one small open area inferiorly this is close to healing out. He has compression stockings. We will use Hydrofera Blue right down to the epithelialization of this Nonviable  surface on the left heel which was initially pressure with a DTI. We've been using Hydrofera Blue. I'm going to switch this back to silver alginate Left first  second toe/tinea pedis this looks better using silver alginate Right first second toe tinea pedis using silver alginate Large pressure ulcers on theLeft ischial tuberosity. Small wound here Looks better. I am uncertain about the surface over the large wound. Using silver alginate 04/11/18; Cellulitis in the right foot is resolved Left lateral calf wound which was his original wounds still has 2 tiny open areas remaining this is just about closed Nonviable surface on the left heel is better but still requires debridement Left first second toe/tinea pedis still open using silver alginate Right first second toe wound tinea pedis I asked him to go back to using ketoconazole and silver alginate Large pressure ulcers on the left ischial tuberosity this shear injury here is resolved. Wound is smaller. No evidence of infection using silver alginate 04/18/18; Patient arrives with an intense area of cellulitis in the right mid lower calf extending into the right heel area. Bright red and warm. Smaller area on the left anterior leg. He has a significant history of MRSA. He will definitely need antibioticsdoxycycline He now has 2 open areas on the left ischial tuberosity the original large wound and now a satellite area which I think was above his initial satellite areas. Not a wonderful surface on this satellite area surrounding erythema which looks like pressure related. His left lateral calf wound again his original wound is just about closed Left heel pressure injury still requiring debridement Left first second toe looks a lot better using silver alginate Right first second toe also using silver alginate and ketoconazole cream also looks better 04/20/18; the patient was worked in early today out of concerns with his cellulitis on the right leg. I  had started him on doxycycline. This was 2 days ago. His wife was concerned about the swelling in the area. Also concerned about the left buttock. He has not been systemically unwell no fever chills. No nausea vomiting or diarrhea 04/25/18; the patient's left buttock wound is continued to deteriorate he is using Hydrofera Blue. He is still completing clindamycin for the cellulitis on the right leg although all of this looks better. 05/02/18 Left buttock wound still with a lot of drainage and a very tightly adherent fibrinous necrotic surface. He has a deeper area superiorly The left lateral calf wound is still closed DTI wound on the left heel necrotic surface especially the circumference using Iodoflex Areas between his left first second toe and right first second toe both look better. Dorsally and the right first second toe he had a necrotic surface although at smaller. In using silver alginate and ketoconazole. I did a culture last week which was a deep tissue culture of the reminiscence of the open wound on the right first second toe dorsally. This grew a few Acinetobacter and a few methicillin-resistant staph aureus. Nevertheless the area actually this week looked better. I didn't feel the need to specifically address this at least in terms of systemic antibiotics. 05/09/18; wounds are measuring larger more drainage per our intake. We are using Santyl covered with alginate on the large superficial buttock wounds, Iodosorb on the left heel, ketoconazole and silver alginate to the dorsal first and second toes bilaterally. 05/16/18; The area on his left buttock better in some aspects although the area superiorly over the ischial tuberosity required an extensive debridement.using Santyl Left heel appears stable. Using Iodoflex The areas between his first and second toes are not bad however there is spreading erythema up the dorsal aspect of his left foot this  looks like cellulitis again. He is  insensate the erythema is really very brilliant.o Erysipelas He went to see an allergist days ago because he was itching part of this he had lab work done. This showed a white count of 15.1 with 70% neutrophils. Hemoglobin of 11.4 and a platelet count of 659,000. Last white count we had in Epic was a 2-1/2 years ago which was 25.9 but he was ill at the time. He was able to show me some lab work that was done by his primary physician the pattern is about the same. 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 But prompted me to go ahead and do x-rays of both feet and the pelvis rule out osteomyelitis. He also had a comprehensive metabolic panel this was reasonably normal his albumin was 3.7 liver function tests BUN/creatinine all normal 05/23/18; x-rays of both his feet from last week were negative for underlying pulmonary abnormality. The x-ray of his pelvis however showed mild irregularity in the left ischial which may represent some early osteomyelitis. The wound in the left ischial continues to get deeper clearly now exposed muscle. Each week necrotic surface material over this area. Whereas the rest of the wounds do not look so bad. The left ischial wound we have been using Santyl and calcium alginate T the left heel surface necrotic debris using Iodoflex o The left lateral leg is still healed Areas on the left dorsal foot and the right dorsal foot are about the same. There is some inflammation on the left which might represent contact dermatitis, fungal dermatitis I am doubtful cellulitis although this looks better than last week 05/30/18; CT scan done at Hospital did not show any osteomyelitis or abscess. Suggested the possibility of underlying cellulitis although I don't see a lot of evidence of this at the bedside The wound itself on the left buttock/upper thigh actually looks somewhat better. No debridement Left heel also looks better no debridement  continue Iodoflex Both dorsal first second toe spaces appear better using Lotrisone. Left still required debridement 06/06/18; Intake reported some purulent looking drainage from the left gluteal wound. Using Santyl and calcium alginate Left heel looks better although still a nonviable surface requiring debridement The left dorsal foot first/second webspace actually expanding and somewhat deeper. I may consider doing a shave biopsy of this area Right dorsal foot first/second webspace appears stable to improved. Using Lotrisone and silver alginate to both these areas 06/13/18 Left gluteal surface looks better. Now separated in the 2 wounds. No debridement required. Still drainage. We'll continue silver alginate Left heel continues to look better with Iodoflex continue this for at least another week Of his dorsal foot wounds the area on the left still has some depth although it looks better than last week. We've been using Lotrisone and silver alginate 06/20/18 Left gluteal continues to look better healthy tissue Left heel continues to look better healthy granulation wound is smaller. He is using Iodoflex and his long as this continues continue the Iodoflex Dorsal right foot looks better unfortunately dorsal left foot does not. There is swelling and erythema of his forefoot. He had minor trauma to this several days ago but doesn't think this was enough to have caused any tissue injury. Foot looks like cellulitis, we have had this problem before 06/27/18 on evaluation today patient appears to be doing a little worse in regard to his foot ulcer. 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 Both of which would really be his only options for treating this infection. In the past he has been given and effusion of Orbactiv. This is done very well for him in the past again  it's one time dosing IV antibiotic therapy. Subsequently I do believe this is something we're gonna need to see about doing at this point in time. Currently his other wounds seem to be doing somewhat better in my pinion I'm pretty happy in that regard. 07/03/18 on evaluation today patient's wounds actually appear to be doing fairly well. He has been tolerating the dressing changes without complication. All in all he seems to be showing signs of improvement. In regard to the antibiotics he has been dealing with infectious disease since I saw him last week as far as getting this scheduled. In the end [facility]'s [facility] this coming Friday. In the meantime he has been continuing to perform the dressing changes in such as previous. There does not appear to be any evidence of infection worsengin at this time. 07/10/18; Since I last saw this man 2 weeks ago things have actually improved. IV antibiotics of resulted in less forefoot erythema although there is still some present. He is not systemically unwell Left buttock wounds 2 now have no depth there is increased epithelialization Using silver alginate Left heel still requires debridement using Iodoflex Left dorsal foot still with a sizable wound about the size of a border but healthy granulation Right dorsal foot still with a slitlike area using silver alginate 07/18/18; the patient's cellulitis in the left foot is improved in fact I think it is on its way to resolving. Left buttock wounds 2 both look better although the larger one has hypertension granulation we've been using silver alginate Left heel has some thick circumferential redundant skin over the wound edge which will need to be removed today we've been using Iodoflex Left dorsal foot is still a sizable wound required debridement using silver alginate The right dorsal foot is just about closed only a small open area remains here 07/25/18;  left foot cellulitis is resolved Left buttock wounds 2 both look better. Hyper-granulation on the major area Left heel as some debris over the surface but otherwise looks a healthier wound. Using silver collagen Right dorsal foot is just about closed 07/31/18; arrives with our intake nurse worried about purulent drainage from the buttock. We had hyper-granulation here last week His buttock wounds 2 continue to look better Left heel some debris over the surface but measuring smaller. Right dorsal foot unfortunately has openings between the toes Left foot superficial wound looks less aggravated. 08/07/18 Buttock wounds continue to look better although some of her granulation and the larger medial wound. silver alginate Left heel continues to look a lot better.silver collagen Left foot superficial wound looks less stable. Requires debridement. He has a new wound superficial area on the foot on the lateral dorsal foot. Right foot looks better using silver alginate without Lotrisone 08/14/2018; patient was in the ER last week diagnosed with a UTI. He is now on Cefpodoxime and Macrodantin. Buttock wounds continued to be smaller. Using silver alginate Left heel continues to look better using silver collagen Left foot superficial wound looks as though it is improving Right dorsal foot area is just about healed. 08/21/2018; patient is  completed his antibiotics for his UTI. He has 2 open areas on the buttocks. There is still not closed although the surface looks satisfactory. Using silver alginate Left heel continues to improve using silver collagen The bilateral dorsal foot areas which are at the base of his first and second toes/possible tinea pedis are actually stable on the left but worse on the right. The area on the left required debridement of necrotic surface. After debridement I obtained a specimen for PCR culture. The right dorsal foot which is been just about healed last week is now  reopened 08/28/2018; culture done on the left dorsal foot showed coag negative staph both staph epidermidis and Lugdunensis. I think this is worthwhile initiating systemic treatment. I will use doxycycline given his long list of allergies. The area on the left heel slightly improved but still requiring debridement. The large wound on the buttock is just about closed whereas the smaller one is larger. Using silver alginate in this area 09/04/2018; patient is completing his doxycycline for the left foot although this continues to be a very difficult wound area with very adherent necrotic debris. We are using silver alginate to all his wounds right foot left foot and the small wounds on his buttock, silver collagen on the left heel. 09/11/2018; once again this patient has intense erythema and swelling of the left forefoot. Lesser degrees of erythema in the right foot. He has a long list of allergies and intolerances. I will reinstitute doxycycline. 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 Using silver alginate Left heel also looks better using silver collagen Unfortunately both the areas on his feet look worse. The area on the left first second webspace is now gone through to the plantar part of his foot. The area on the left foot anteriorly is irritated with erythema and swelling in the forefoot. 09/25/2018 His wound on the left plantar heel looks better. Using silver collagen The area on the left buttock 2 small remnant areas. One is closed one is still open. Using silver alginate The areas between both his first and second toes look worse. This in spite of long-standing antifungal therapy with ketoconazole and silver alginate which should have antifungal activity He has small areas around his original wound on the left calf one is on the bottom of the original scar tissue and one superiorly both of these are small and superficial but again given wound  history in this site this is worrisome 10/02/2018 Left plantar heel continues to gradually contract using silver collagen Left buttock wound is unchanged using silver alginate The areas on his dorsal feet between his first and second toes bilaterally look about the same. I prescribed clindamycin ointment to see if we can address chronic staph colonization and also the underlying possibility of erythrasma The left lateral lower extremity wound is actually on the lateral part of his ankle. Small open area here. We have been using silver alginate 10/09/2018; Left plantar heel continues to look healthy and contract. No debridement is required Left buttock slightly smaller with a tape injury wound just below which was new this week Dorsal feet somewhat improved I have been using clindamycin Left lateral looks lower extremity the actual open area looks worse although a lot of this is epithelialized. I am going to change to silver collagen today He has a lot more swelling in the right leg although this is not pitting not red and not particularly warm  there is a lot of spasm in the right leg usually indicative of people with paralysis of some underlying discomfort. We have reviewed his vascular status from 2017 he had a left greater saphenous vein ablation. I wonder about referring him back to vascular surgery if the area on the left leg continues to deteriorate. 10/16/2018 in today for follow-up and management of multiple lower extremity ulcers. His left Buttock wound is much lower smaller and almost closed completely. The wound to the left ankle has began to reopen with Epithelialization and some adherent slough. He has multiple new areas to the left foot and leg. The left dorsal foot without much improvement. Wound present between left great webspace and 2nd toe. Erythema and edema present right leg. Right LE ultrasound obtained on 10/10/18 was negative for DVT . 10/23/2018; Left buttock is closed  over. Still dry macerated skin but there is no open wound. I suspect this is chronic pressure/moisture Left lateral calf is quite a bit worse than when I saw this last. There is clearly drainage here he has macerated skin into the left plantar heel. We will change the primary dressing to alginate Left dorsal foot has some improvement in overall wound area. Still using clindamycin and silver alginate Right dorsal foot about the same as the left using clindamycin and silver alginate The erythema in the right leg has resolved. He is DVT rule out was negative Left heel pressure area required debridement although the wound is smaller and the surface is health 10/26/2018 The patient came back in for his nurse check today predominantly because of the drainage coming out of the left lateral leg with a recent reopening of his original wound on the left lateral calf. He comes in today with a large amount of surrounding erythema around the wound extending from the calf into the ankle and even in the area on the dorsal foot. He is not systemically unwell. He is not febrile. Nevertheless this looks like cellulitis. We have been using silver alginate to the area. I changed him to a regular visit and I am going to prescribe him doxycycline. The rationale here is a long list of medication intolerances and a history of MRSA. I did not see anything that I thought would provide a valuable culture 10/30/2018 Follow-up from his appointment 4 days ago with really an extensive area of cellulitis in the left calf left lateral ankle and left dorsal foot. I put him on doxycycline. He has a long list of medication allergies which are true allergy reactions. 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 In any case he is a lot better today. The erythema especially in the anterior and lateral left calf is better. He still has left ankle erythema. He also is  complaining about increasing edema in the right leg we have only been using Kerlix Coban and he has been doing the wraps at home. Finally he has a spotty rash on the medial part of his upper left calf which looks like folliculitis or perhaps wrap occlusion type injury. Small superficial macules not pustules 11/06/18 patient arrives today with again a considerable degree of erythema around the wound on the left lateral calf extending into the dorsal ankle and dorsal foot. This is a lot worse than when I saw this last week. He is on doxycycline really with not a lot of improvement. He has not been systemically unwell Wounds on the; left heel actually  looks improved. Original area on the left foot and proximity to the first and second toes looks about the same. He has superficial areas on the dorsal foot, anterior calf and then the reopening of his original wound on the left lateral calf which looks about the same The only area he has on the right is the dorsal webspace first and second which is smaller. He has a large area of dry erythematous skin on the left buttock small open area here. 11/13/2018; the patient arrives in much better condition. The erythema around the wound on the left lateral calf is a lot better. Not sure whether this was the clindamycin or the TCA and ketoconazole or just in the improvement in edema control [stasis dermatitis]. In any case this is a lot better. The area on the left heel is very small and just about resolved using silver collagen we have been using silver alginate to the areas on his dorsal feet 11/20/2018; his wounds include the left lateral calf, left heel, dorsal aspects of both feet just proximal to the first second webspace. He is stable to slightly improved. I did not think any changes to his dressings were going to be necessary 11/27/2018 he has a reopening on the left buttock which is surrounded by what looks like tinea or perhaps some other form of dermatitis.  The area on the left dorsal foot has some erythema around it I have marked this area but I am not sure whether this is cellulitis or not. Left heel is not closed. Left calf the reopening is really slightly longer and probably worse 1/13; in general things look better and smaller except for the left dorsal foot. Area on the left heel is just about closed, left buttock looks better only a small wound remains in the skin looks better [using Lotrisone] 1/20; the area on the left heel only has a few remaining open areas here. Left lateral calf about the same in terms of size, left dorsal foot slightly larger right lateral foot still not closed. The area on the left buttock has no open wound and the surrounding skin looks a lot better 1/27; the area on the left heel is closed. Left lateral calf better but still requiring extensive debridements. The area on his left buttock is closed. He still has the open areas on the left dorsal foot which is slightly smaller in the right foot which is slightly expanded. We have been using Iodoflex on these areas as well 2/3; left heel is closed. Left lateral calf still requiring debridement using Iodoflex there is no open area on his left buttock however he has dry scaly skin over a large area of this. Not really responding well to the Lotrisone. Finally the areas on his dorsal feet at the level of the first second webspace are slightly smaller on the right and about the same on the left. Both of these vigorously debrided with Anasept and gauze 2/10; left heel remains closed he has dry erythematous skin over the left buttock but there is no open wound here. Left lateral leg has come in and with. Still requiring debridement we have been using Iodoflex here. Finally the area on the left dorsal foot and right dorsal foot are really about the same extremely dry callused fissured areas. He does not yet have a dermatology appointment 2/17; left heel remains closed. He has a new  open area on the left buttock. The area on the left lateral calf is bigger longer and  still covered in necrotic debris. No major change in his foot areas bilaterally. I am awaiting for a dermatologist to look on this. We have been using ketoconazole I do not know that this is been doing any good at all. 2/24; left heel remains closed. The left buttock wound that was new reopening last week looks better. The left lateral calf appears better also although still requires debridement. The major area on his foot is the left first second also requiring debridement. We have been putting Prisma on all wounds. I do not believe that the ketoconazole has done too much good for his feet. He will use Lotrisone I am going to give him a 2-week course of terbinafine. We still do not have a dermatology appointment 3/2 left heel remains closed however there is skin over bone in this area I pointed this out to him today. The left buttock wound is epithelialized but still does not look completely stable. The area on the left leg required debridement were using silver collagen here. With regards to his feet we changed to Lotrisone last week and silver alginate. 3/9; left heel remains closed. Left buttock remains closed. The area on the right foot is essentially closed. The left foot remains unchanged. Slightly smaller on the left lateral calf. Using silver collagen to both of these areas 3/16-Left heel remains closed. Area on right foot is closed. Left lateral calf above the lateral malleolus open wound requiring debridement with easy bleeding. Left dorsal wound proximal to first toe also debrided. Left ischial area open new. Patient has been using Prisma with wrapping every 3 days. Dermatology appointment is apparently tomorrow.Patient has completed his terbinafine 2-week course with some apparent improvement according to him, there is still flaking and dry skin in his foot on the left 3/23; area on the right foot is  reopened. The area on the left anterior foot is about the same still a very necrotic adherent surface. He still has the area on the left leg and reopening is on the left buttock. He apparently saw dermatology although I do not have a note. According to the patient who is usually fairly well informed they did not have any good ideas. Put him on oral terbinafine which she is been on before. 3/30; using silver collagen to all wounds. Apparently his dermatologist put him on doxycycline and rifampin presumably some culture grew staph. I do not have this result. He remains on terbinafine although I have used terbinafine on him before 4/6; patient has had a fairly substantial reopening on the right foot between the first and second toes. He is finished his terbinafine and I believe is on doxycycline and rifampin still as prescribed by dermatology. We have been using silver collagen to all his wounds although the patient reports that he thinks silver alginate does better on the wounds on his buttock. 4/13; the area on his left lateral calf about the same size but it did not require debridement. Left dorsal foot just proximal to the webspace between the first and second toes is about the same. Still nonviable surface. I note some superficial bronze discoloration of the dorsal part of his foot Right dorsal foot just proximal to the first and second toes also looks about the same. I still think there may be the same discoloration I noted above on the left Left buttock wound looks about the same 4/20; left lateral calf appears to be gradually contracting using silver collagen. He remains on erythromycin empiric treatment for possible  erythrasma involving his digital spaces. The left dorsal foot wound is debrided of tightly adherent necrotic debris and really cleans up quite nicely. The right area is worse with expansion. I did not debride this it is now over the base of the second toe The area on his left  buttock is smaller no debridement is required using silver collagen 5/4; left calf continues to make good progress. He arrives with erythema around the wounds on his dorsal foot which even extends to the plantar aspect. Very concerning for coexistent infection. He is finished the erythromycin I gave him for possible erythrasma this does not seem to have helped. The area on the left foot is about the same base of the dorsal toes Is area on the buttock looks improved on the left 5/11; left calf and left buttock continued to make good progress. Left foot is about the same to slightly improved. Major problem is on the right foot. He has not had an x-ray. Deep tissue culture I did last week showed both Enterobacter and E. coli. I did not change the doxycycline I put him on empirically although neither 1 of these were plated to doxycycline. He arrives today with the erythema looking worse on both the dorsal and plantar foot. Macerated skin on the bottom of the foot. he has not been systemically unwell 5/18-Patient returns at 1 week, left calf wound appears to be making some progress, left buttock wound appears slightly worse than last time, left foot wound looks slightly better, right foot redness is marginally better. X-ray of both feet show no air or evidence of osteomyelitis. Patient is finished his Omnicef and terbinafine. He continues to have macerated skin on the bottom of the left foot as well as right 5/26; left calf wound is better, left buttock wound appears to have multiple small superficial open areas with surrounding macerated skin. X-rays that I did last time showed no evidence of osteomyelitis in either foot. He is finished cefdinir and doxycycline. I do not think that he was on terbinafine. He continues to have a large superficial open area on the right foot anterior dorsal and slightly between the first and second toes. I did send him to dermatology 2 months ago or so wondering about  whether they would do a fungal scraping. I do not believe they did but did do a culture. We have been using silver alginate to the toe areas, he has been using antifungals at home topically either ketoconazole or Lotrisone. We are using silver collagen on the left foot, silver alginate on the right, silver collagen on the left lateral leg and silver alginate on the left buttock 6/1; left buttock area is healed. We have the left dorsal foot, left lateral leg and right dorsal foot. We are using silver alginate to the areas on both feet and silver collagen to the area on his left lateral calf 6/8; the left buttock apparently reopened late last week. He is not really sure how this happened. He is tolerating the terbinafine. Using silver alginate to all wounds 6/15; left buttock wound is larger than last week but still superficial. Came in the clinic today with a report of purulence from the left lateral leg I did not identify any infection Both areas on his dorsal feet appear to be better. He is tolerating the terbinafine. Using silver alginate to all wounds 6/22; left buttock is about the same this week, left calf quite a bit better. His left foot is about the same  however he comes in with erythema and warmth in the right forefoot once again. Culture that I gave him in the beginning of May showed Enterobacter and E. coli. I gave him doxycycline and things seem to improve although neither 1 of these organisms was specifically plated. 6/29; left buttock is larger and dry this week. Left lateral calf looks to me to be improved. Left dorsal foot also somewhat improved right foot completely unchanged. The erythema on the right foot is still present. He is completing the Ceftin dinner that I gave him empirically [see discussion above.) 7/6 - All wounds look to be stable and perhaps improved, the left buttock wound is slightly smaller, per patient bleeds easily, completed ceftin, the right foot redness is  less, he is on terbinafine 7/13; left buttock wound about the same perhaps slightly narrower. Area on the left lateral leg continues to narrow. Left dorsal foot slightly smaller right foot about the same. We are using silver alginate on the right foot and Hydrofera Blue to the areas on the left. Unna boot on the left 2 layer compression on the right 7/20; left buttock wound absolutely the same. Area on lateral leg continues to get better. Left dorsal foot require debridement as did the right no major change in the 7/27;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 with an ulcer in the middle. The patient and apparently his wife have no history to this. This may need to be biopsied. He has the arterial and venous studies we ordered last week ordered for March 01/16/16; the patient's 2 wounds on his right leg on the anterior leg and Achilles area are both healed. He continues to have a deep wound with very adherent necrotic eschar and slough on the lateral aspect of his left leg in 2 areas and also raised area over the left Achilles. We  put Santyl on this last week and left him in a rapid. He says the drainage went through. He has some Kerlix Coban and in some Profore at home I have therefore written him a prescription for Santyl and he can change this at home on his own. 01/23/16; the original 2 wounds on the right leg are apparently still closed. He continues to have a deep wound on his left lateral leg in 2 spots the superior one much larger than the inferior one. He also has a raised area on the left Achilles. We have been putting Santyl and all of these wounds. His wife is changing this at home one time this week although she may be able to do this more frequently. 01/30/16 no open wounds on the right leg. He continues to have a deep wound on the left lateral leg in 2 spots and a smaller wound over the left Achilles area. [name] This debridement is with great difficulty. [name] 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 In spite of that his leg actually looks somewhat better. There is still copious drainage and some erythema but it is generally better. The oral options that were obvious including Zyvox and sulfonamides he has rash issues both of these. This is sensitive to rifampin but this is not usually used along gentamicin but this is parenteral and again not used along. The obvious alternative is vancomycin. He has had his arterial studies. [name] T brachial index was 1.3 oe on the right. His waveforms were biphasic bilaterally. Doppler waveforms of the digit were normal in the right damp and on the left. Comment that this could've been due to extreme edema. His venous studies show reflux  on both sides in the femoral popliteal veins as well as the greater and lesser saphenous veins bilaterally. Ultimately he is going to need to see vascular surgery about this issue. Hopefully when we can get his wounds and a little better shape. 02/19/16; the patient was able to complete a course of Delavan's for MRSA in the face of multiple antibiotic allergies. Arterial studies showed an ABI of him 0.88 on the right 1.17 on the left the. Waveforms were biphasic at the posterior tibial and dorsalis pedis digital waveforms were normal. Right toe brachial index was 1.3 limited by shaking and edema. His venous study showed widespread reflux in the left at the common femoral vein the greater and lesser saphenous vein the greater and lesser saphenous vein on the right as well as the  popliteal and femoral vein. The popliteal and femoral vein on the left did not show reflux. His wounds on the right leg give healed on the left he is still using Santyl. 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 The erythema has not really resolved and I wonder if this is mostly venous inflammation rather than cellulitis. Still using Santyl. He is approved for Apligraf 03/04/16; there is less erythema around the wound. Both wounds require aggressive surgical debridement. Not yet ready for Apligraf 03/11/16; aggressive debridement again. Not ready for Apligraf 03/18/16 aggressive debridement again. 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 Has been to see vascular surgery he is being planned for a venous ablation 03/25/16; aggressive debridement again of both wound areas on the left lateral leg. He is due for ablation surgery on May 22. He is much closer to being ready for an Apligraf. Has a new area between the left first and second toes 04/01/16 aggressive debridement done of both wounds. The new wound at the base of between his second and first toes looks  stable 04/08/16; continued aggressive debridement of both wounds on the left lower leg. He goes for his venous ablation on Monday. The new wound at the base of his first and second toes dorsally appears stable. 04/15/16; wounds aggressively debridement although the base of this looks considerably better Apligraf #1. He had ablation surgery on Monday I'll need to research these records. We only have approval for four Apligraf's 04/22/16; the patient is here for a wound check [Apligraf last week] intake nurse concerned about erythema around the wounds. Apparently a significant degree of drainage. The patient has chronic venous inflammation which I think accounts for most of this however I was asked to look at this today 04/26/16; the patient came back for check of possible cellulitis in his left foot however the Apligraf dressing was inadvertently removed therefore we elected to prep the wound for a second Apligraf. I put him on doxycycline on 6/1 the erythema in the foot 05/03/16 we did not remove the dressing from the superior wound as this is where I put all of his last Apligraf. Surface debridement done with a curette of the lower wound which looks very healthy. The area on the left foot also looks quite satisfactory at the dorsal artery at the first and second toes 05/10/16; continue Apligraf to this. Her wound, Hydrafera to the lower wound. He has a new area on the right second toe. Left dorsal foot firstoosecond toe also looks improved 05/24/16; wound dimensions must be smaller I was able to use Apligraf to all 3 remaining wound areas. 06/07/16 patient's last Apligraf was 2 weeks ago. He arrives today with the 2 wounds on his lateral left leg joined together. This would have to be seen as a negative. He also has a small wound in his first and second toe on the left dorsally with quite a bit of surrounding erythema in the first second and third toes. This looks to be infected or inflamed, very difficult  clinical call. 06/21/16: lateral left leg combined wounds. Adherent surface slough area on the left dorsal foot at roughly the fourth toe looks improved 07/12/16; he now has a single linear wound on the lateral left leg. This does not look to be a lot changed from when I lost saw this. The area on his dorsal left foot looks considerably better however. 08/02/16; no major change in the substantial area on his  left lateral leg since last time. We have been using Hydrofera Blue for a prolonged period of time now. The area on his left foot is also unchanged from last review 07/19/16; the area on his dorsal foot on the left looks considerably smaller. He is beginning to have significant rims of epithelialization on the lateral left leg wound. This also looks better. 08/05/16; the patient came in for a nurse visit today. Apparently the area on his left lateral leg looks better and it was wrapped. However in general discussion the patient noted a new area on the dorsal aspect of his right second toe. The exact etiology of this is unclear but likely relates to pressure. 08/09/16 really the area on the left lateral leg did not really look that healthy today perhaps slightly larger and measurements. The area on his dorsal right second toe is improved also the left foot wound looks stable to improved 08/16/16; the area on the last lateral leg did not change any of dimensions. Post debridement with a curet the area looked better. Left foot wound improved and the area on the dorsal right second toe is improved 08/23/16; the area on the left lateral leg may be slightly smaller both in terms of length and width. Aggressive debridement with a curette afterwards the tissue appears healthier. Left foot wound appears improved in the area on the dorsal right second toe is improved 08/30/16 patient developed a fever over the weekend and was seen in an urgent care. Felt to have a UTI and put on doxycycline. He has been since  changed over the phone to [facility]. After we took off the wrap on his right leg today the leg is swollen warm and erythematous, probably more likely the source of the fever 09/06/16; have been using collagen to the major left leg wound, silver alginate to the area on his anterior foot/toes 09/13/16; the areas on his anterior foot/toes on both sides appear to be virtually closed. Extensive wound on the left lateral leg perhaps slightly narrower but each visit still covered an adherent surface slough 09/16/16 patient was in for his usual Thursday nurse visit however the intake nurse noted significant erythema of his dorsal right foot. He is also running a low- grade fever and having increasing spasms in the right leg 09/20/16 here for cellulitis involving his right great toes and forefoot. This is a lot better. Still requiring debridement on his left lateral leg. Santyl direct says he needs prior authorization. Therefore his wife cannot change this at home 09/30/16; the patient's extensive area on the left lateral calf and ankle perhaps somewhat better. Using Santyl. The area on the left toes is healed and I think the area on his right dorsal foot is healed as well. There is no cellulitis or venous inflammation involving the right leg. He is going to need compression stockings here. 10/07/16; the patient's extensive wound on the left lateral calf and ankle does not measure any differently however there appears to be less adherent surface slough using Santyl and aggressive weekly debridements 10/21/16; no major change in the area on the left lateral calf. Still the same measurement still very difficult to debridement adherent slough and nonviable subcutaneous tissue. This is not really been helped by several weeks of Santyl. Previously for 2 weeks I used Iodoflex for a short period. A prolonged course of Hydrofera Blue didn't really help. I'm not sure why I only used 2 weeks of Iodoflex on this there is  no evidence of  surrounding infection. He has a small area on the right second toe which looks as though it's progressing towards closure 10/28/16; the wounds on his toes appear to be closed. No major change in the left lateral leg wound although the surface looks somewhat better using Iodoflex. He has had previous arterial studies that were normal. He has had reflux studies and is status post ablation although I don't have any exact notes on which vein was ablated. I'll need to check the surgical record 11/04/16; he's had a reopening between the first and second toe on the left and right. No major change in the left lateral leg wound. There is what appears to be cellulitis of the left dorsal foot 11/18/16 the patient was hospitalized initially in [place] and then subsequently transferred to [facility] long and was admitted there from 11/09/16 through 11/12/16. He had developed progressive cellulitis on the right leg in spite of the doxycycline I gave him. I'd spoken to the hospitalist in [place] who was concerned about continuing leukocytosis. CT scan is what I suggested this was done which showed soft tissue swelling without evidence of osteomyelitis or an underlying abscess blood cultures were negative. At [facility] he was treated with vancomycin and Primaxin and then add an infectious disease consult. He was transitioned to Ceftaroline. He has been making progressive improvement. Overall a severe cellulitis of the right leg. He is been using silver alginate to her original wound on the left leg. The wounds in his toes on the right are closed there is a small open area on the base of the left second toe 11/26/15; the patient's right leg is much better although there is still some edema here this could be reminiscent from his severe cellulitis likely on top of some degree of lymphedema. His left anterior leg wound has less surface slough as reported by her intake nurse. Small wound at the base of the left  second toe 12/02/16; patient's right leg is better and there is no open wound here. His left anterior lateral leg wound continues to have a healthy-looking surface. Small wound at the base of the left second toe however there is erythema in the left forefoot which is worrisome 12/16/16; is no open wounds on his right leg. We took measurements for stockings. His left anterior lateral leg wound continues to have a healthy-looking surface. I'm not sure where we were with the Apligraf run through his insurance. We have been using Iodoflex. He has a thick eschar on the left first second toe interface, I suspect this may be fungal however there is no visible open 12/23/16; no open wound on his right leg. He has 2 small areas left of the linear wound that was remaining last week. We have been using Prisma, I thought I have disclosed this week, we can only look forward to next week 01/03/17; the patient had concerning areas of erythema last week, already on doxycycline for UTI through his primary doctor. The erythema is absolutely no better there is warmth and swelling both medially from the left lateral leg wound and also the dorsal left foot. 01/06/17- Patient is here for follow-up evaluation of his left lateral leg ulcer and bilateral feet ulcers. He is on oral antibiotic therapy, tolerating that. Nursing staff and the patient states that the erythema is improved from Monday. 01/13/17; the predominant left lateral leg wound continues to be problematic. I had put Apligraf on him earlier this month once. However he subsequently developed what appeared to be  an intense cellulitis around the left lateral leg wound. I gave him Dalvance I think on 2/12 perhaps 2/13 he continues on cefdinir. The erythema is still present but the warmth and swelling is improved. I am hopeful that the cellulitis part of this control. I wouldn't be surprised if there is an element of venous inflammation as well. 01/17/17. The erythema is  present but better in the left leg. His left lateral leg wound still does not have a viable surface buttons certain parts of this long thin wound it appears like there has been improvement in dimensions. 01/20/17; the erythema still present but much better in the left leg. I'm thinking this is his usual degree of chronic venous inflammation. The wound on the left leg looks somewhat better. Is less surface slough 01/27/17; erythema is back to the chronic venous inflammation. The wound on the left leg is somewhat better. I am back to the point where I like to try an Apligraf once again 02/10/17; slight improvement in wound dimensions. Apligraf #2. He is completing his doxycycline 02/14/17; patient arrives today having completed doxycycline last Thursday. This was supposed to be a nurse visit however once again he hasn't tense erythema from the medial part of his wound extending over the lower leg. Also erythema in his foot this is roughly in the same distribution as last time. He has baseline chronic venous inflammation however this is a lot worse than the baseline I have learned to accept the on him is baseline inflammation 02/24/17- patient is here for follow-up evaluation. He is tolerating compression therapy. His voicing no complaints or concerns he is here anticipating an Apligraf 03/03/17; he arrives today with an adherent necrotic surface. I don't think this is surface is going to be amenable for Apligraf's. The erythema around his wound and on the left dorsal foot has resolved he is off antibiotics 03/10/17; better-looking surface today. I don't think he can tolerate Apligraf's. He tells me he had a wound VAC after a skin graft years ago to this area and they had difficulty with a seal. The erythema continues to be stable around this some degree of chronic venous inflammation but he also has recurrent cellulitis. We have been using Iodoflex 03/17/17; continued improvement in the surface and may be small  changes in dimensions. Using Iodoflex which seems the only thing that will control his surface 03/24/17- He is here for follow up evaluation of his LLE lateral ulceration and ulcer to right dorsal foot/toe space. He is voicing no complaints or concerns, He is tolerating compression wrap. 03/31/17 arrives today with a much healthier looking wound on the left lower extremity. We have been using Iodoflex for a prolonged period of time which has for the first time prepared and adequate looking wound bed although we have not had much in the way of wound dimension improvement. He also has a small wound between the first and second toe on the right 04/07/17; arrives today with a healthy-looking wound bed and at least the top 50% of this wound appears to be now her. No debridement was required I have changed him to [facility] last week after prolonged Iodoflex. He did not do well with Apligraf's. We've had a re-opening between the first and second toe on the right 04/14/17; arrives today with a healthier looking wound bed contractions and the top 50% of this wound and some on the lesser 50%. Wound bed appears healthy. The area between the first and second toe on the  right still remains problematic 04/21/17; continued very gradual improvement. Using [facility] 04/28/17; continued very gradual improvement in the left lateral leg venous insufficiency wound. His periwound erythema is very mild. We have been using Hydrofera Blue. Wound is making progress especially in the superior 50% 05/05/17; he continues to have very gradual improvement in the left lateral venous insufficiency wound. Both in terms with an length rings are improving. I debrided this every 2 weeks with #5 curet and we have been using Hydrofera Blue and again making good progress With regards to the wounds between his right first and second toe which I thought might of been tinea pedis he is not making as much progress very dry scaly skin over  the area. Also the area at the base of the left first and second toe in a similar condition 05/12/17; continued gradual improvement in the refractory left lateral venous insufficiency wound on the left. Dimension smaller. Surface still requiring debridement using Hydrofera Blue 05/19/17; continued gradual improvement in the refractory left lateral venous ulceration. Careful inspection of the wound bed underlying rumination suggested some degree of epithelialization over the surface no debridement indicated. Continue Hydrofera Blue difficult areas between his toes first and third on the left than first and second on the right. I'm going to change to silver alginate from silver collagen. Continue ketoconazole as I suspect underlying tinea pedis 05/26/17; left lateral leg venous insufficiency wound. We've been using Hydrofera Blue. I believe that there is expanding epithelialization over the surface of the wound albeit not coming from the wound circumference. This is a bit of an odd situation in which the epithelialization seems to be coming from the surface of the wound rather than in the exact circumference. There is still small open areas mostly along the lateral margin of the wound. ooHe has unchanged areas between the left first and second and the right first second toes which I been treating for tenia pedis 06/02/17; left lateral leg venous insufficiency wound. We have been using Hydrofera Blue. Somewhat smaller from the wound circumference. The surface of the wound remains a bit on it almost epithelialized sedation in appearance. I use an open curette today debridement in the surface of all of this especially the edges ooSmall open wounds remaining on the dorsal right first and second toe interspace and the plantar left first second toe and her face on the left 06/09/17; wound on the left lateral leg continues to be smaller but very gradual and very dry surface using Hydrofera Blue 06/16/17 requires  weekly debridements now on the left lateral leg although this continues to contract. I changed to silver collagen last week because of dryness of the wound bed. Using Iodoflex to the areas on his first and second toes/web space bilaterally 06/24/17; patient with history of paraplegia also chronic venous insufficiency with lymphedema. Has a very difficult wound on the left lateral leg. This has been gradually reducing in terms of with but comes in with a very dry adherent surface. High switch to silver collagen a week or so ago with hydrogel to keep the area moist. This is been refractory to multiple dressing attempts. He also has areas in his first and second toes bilaterally in the anterior and posterior web space. I had been using Iodoflex here after a prolonged course of silver alginate with ketoconazole was ineffective [question tinea pedis] 07/14/17; patient arrives today with a very difficult adherent material over his left lateral lower leg wound. He also has surrounding erythema and  poorly controlled edema. He was switched his Santyl last visit which the nurses are applying once during his doctor visit and once on a nurse visit. He was also reduced to 2 layer compression I'm not exactly sure of the issue here. 07/21/17; better surface today after 1 week of Iodoflex. Significant cellulitis that we treated last week also better. [Doxycycline] 07/28/17 better surface today with now 2 weeks of Iodoflex. Significant cellulitis treated with doxycycline. He has now completed the doxycycline and he is back to his usual degree of chronic venous inflammation/stasis dermatitis. He reminds me he has had ablations surgery here 08/04/17; continued improvement with Iodoflex to the left lateral leg wound in terms of the surface of the wound although the dimensions are better. 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 Problematic areas on the  plantar aspect of the first second toe web space on the left and the dorsal aspect of the first second toe web space on the right. At one point I felt these were probably related to chronic fungal infections in treated him aggressively for this although we have not made any improvement here. 08/11/17; left lateral leg. Surface continues to improve with the Iodoflex although we are not seeing much improvement in overall wound dimensions. Areas on his plantar left foot and right foot show no improvement. In fact the right foot looks somewhat worse 08/18/17; left lateral leg. We changed to [facility] Blue last week after a prolonged course of Iodoflex which helps get the surface better. It appears that the wound with is improved. Continue with difficult areas on the left dorsal first second and plantar first second on the right 09/01/17; patient arrives in clinic today having had a temperature of 103 yesterday. He was seen in the ER and [facility]. The patient was concerned he could have cellulitis again in the right leg however they diagnosed him with a UTI and he is now on Keflex. He has a history of cellulitis which is been recurrent and difficult but this is been in the left leg, in the past 5 use doxycycline. He does in and out catheterizations at home which are risk factors for UTI 09/08/17; patient will be completing his Keflex this weekend. The erythema on the left leg is considerably better. He has a new wound today on the medial part of the right leg small superficial almost looks like a skin tear. He has worsening of the area on the right dorsal first and second toe. His major area on the left lateral leg is better. Using Hydrofera Blue on all areas 09/15/17; gradual reduction in width on the long wound in the left lateral leg. No debridement required. He also has wounds on the plantar aspect of his left first second toe web space and on the dorsal aspect of the right first second toe web  space. 09/22/17; there continues to be very gradual improvements in the dimensions of the left lateral leg wound. He hasn't round erythematous spot with might be pressure on his wheelchair. There is no evidence obviously of infection no purulence no warmth ooHe has a dry scaled area on the plantar aspect of the left first second toe ooImproved area on the dorsal right first second toe. 09/29/17; left lateral leg wound continues to improve in dimensions mostly with an is still a fairly long but increasingly narrow wound. ooHe has a dry scaled area on the plantar aspect of his left first second toe  web space ooIncreasingly concerning area on the dorsal right first second toe. In fact I am concerned today about possible cellulitis around this wound. The areas extending up his second toe and although there is deformities here almost appears to abut on the nailbed. 10/06/17; left lateral leg wound continues to make very gradual progress. Tissue culture I did from the right first second toe dorsal foot last time grew MRSA and enterococcus which was vancomycin sensitive. This was not sensitive to clindamycin or doxycycline. He is allergic to Zyvox and sulfa we have therefore arrange for him to have dalvance infusion tomorrow. He is had this in the past and tolerated it well 10/20/17; left lateral leg wound continues to make decent progress. This is certainly reduced in terms of with there is advancing epithelialization.ooThe cellulitis in the right foot looks better although he still has a deep wound in the dorsal aspect of the first second toe web space. Plantar left first toe web space on the left I think is making some progress 10/27/17; left lateral leg wound continues to make decent progress. Advancing epithelialization.using Hydrofera Blue ooThe right first second toe web space wound is better-looking using silver alginate ooImprovement in the left plantar first second toe web space. Again using  silver alginate 11/03/17 left lateral leg wound continues to make decent progress albeit slowly. Using Hydrofera Blue ooThe right per second toe web space continues to be a very problematic looking punched out wound. I obtained a piece of tissue for deep culture I did extensively treated this for fungus. It is difficult to imagine that this is a pressure area as the patient states other than going outside he doesn't really wear shoes at home ooThe left plantar first second toe web space looked fairly senescent. Necrotic edges. This required debridement oochange to Hydrofera Blue to all wound areas 11/10/17; left lateral leg wound continues to contract. Using Hydrofera Blue ooOn the right dorsal first second toe web space dorsally. Culture I did of this area last week grew MRSA there is not an easy oral option in this patient was multiple antibiotic allergies or intolerances. This was only a rare culture isolate I'm therefore going to use Bactroban under silver alginate ooOn the left plantar first second toe web space. Debridement is required here. This is also unchanged 11/17/17; left lateral leg wound continues to contract using Hydrofera Blue this is no longer the major issue. ooThe major concern here is the right first second toe web space. He now has an open area going from dorsally to the plantar aspect. There is now wound on the inner lateral part of the first toe. Not a very viable surface on this. There is erythema spreading medially into the forefoot. ooNo major change in the left first second toe plantar wound 11/24/17; left lateral leg wound continues to contract using Hydrofera Blue. Nice improvement today ooThe right first second toe web space all of this looks a lot less angry than last week. I have given him clindamycin and topical Bactroban for MRSA and terbinafine for the possibility of underlining tinea pedis that I could not control with ketoconazole. Looks somewhat  better ooThe area on the plantar left first second toe web space is weeping with dried debris around the wound 12/01/17; left lateral leg wound continues to contract he Hydrofera Blue. It is becoming thinner in terms of with nevertheless it is making good improvement. ooThe right first second toe web space looks less angry but still a large necrotic-looking wounds  starting on the plantar aspect of the right foot extending between the toes and now extensively on the base of the right second toe. I gave him clindamycin and topical Bactroban for MRSA anterior benefiting for the possibility of underlying tinea pedis. Not looking better today ooThe area on the left first/second toe looks better. Debrided of necrotic debris 12/05/17* the patient was worked in urgently today because over the weekend he found blood on his incontinence bad when he woke up. He was found to have an ulcer by his wife who does most of his wound care. He came in today for [place] to look at this. He has not had a history of wounds in his buttocks in spite of his paraplegia. 12/08/17; seen in follow-up today at his usual appointment. He was seen earlier this week and found to have a new wound on his buttock. We also follow him for wounds on the left lateral leg, left first second toe web space and right first second toe web space 12/15/17; we have been using Hydrofera Blue to the left lateral leg which has improved. The right first second toe web space has also improved. Left first second toe web space plantar aspect looks stable. The left buttock has worsened using Santyl. Apparently the buttock has drainage 12/22/17; we have been using Hydrofera Blue to the left lateral leg which continues to improve now 2 small wounds separated by normal skin. He tells [place] he had a fever up to 100 yesterday he is prone to UTIs but has not noted anything different. He does in and out catheterizations. The area between the first and second toes today does  not look good necrotic surface covered with what looks to be purulent drainage and erythema extending into the third toe. I had gotten this to something that I thought look better last time however it is not look good today. He also has a necrotic surface over the buttock wound which is expanded. I thought there might be infection under here so I removed a lot of the surface with a #5 curet though nothing look like it really needed culturing. He is been using Santyl to this area 12/27/17; his original wound on the left lateral leg continues to improve using Hydrofera Blue. I gave him samples of Baxdella although he was unable to take them out of fear for an allergic reaction ["lump in his throat"].the culture I did of the purulent drainage from his second toe last week showed both enterococcus and a set Enterobacter I was also concerned about the erythema on the bottom of his foot although paradoxically although this looks somewhat better today. 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 We've been using silver alginate here. They came up today that he sleeps in a recliner, I'm not sure why but I asked him to stop this 01/03/18; his original wound we've been using Hydrofera Blue is now separated into 2 areas. ooUlcer on his left buttock is better he is off the recliner and sleeping in bed ooFinally both wound areas between his first and second toes also looks some better 01/10/18; his original wound on the left lateral leg is now separated into 2 wounds we've been using Hydrofera Blue ooUlcer on his left buttock has some drainage. There is a small probing site going into muscle layer superiorly.using silver alginate -He arrives today with a deep tissue injury on the left heel   ooThe wound on the dorsal aspect of his first second toe on the left looks a lot betterusing silver alginate ketoconazole ooThe area  on the first second toe web space on the right also looks a lot bette 01/17/18; his original wound on the left lateral leg continues to progress using Hydrofera Blue ooUlcer on his left buttock also is smaller surface healthier except for a small probing site going into the muscle layer superiorly. 2.4 cm of tunneling in this area ooDTI on his left heel we have only been offloading. Looks better than last week no threatened open no evidence of infection oothe wound on the dorsal aspect of the first second toe on the left continues to look like it's regressing we have only been using silver alginate and terbinafine orally ooThe area in the first second toe web space on the right also looks to be a lot better using silver alginate and terbinafine I think this was prompted by tinea pedis 01/31/18; the patient was hospitalized in [place] last week apparently for a complicated UTI. He was discharged on cefepime he does in and out catheterizations. In the hospital he was discovered M I don't mild elevation of AST and ALT and the terbinafine was stopped.predictably the pressure ulcer on s his buttock looks betterusing silver alginate. The area on the left lateral leg also is better using Hydrofera Blue. The area between the first and second toes on the left better. First and second toes on the right still substantial but better. Finally the DTI on the left heel has held together and looks like it's resolving 02/07/18-he is here in follow-up evaluation for multiple ulcerations. He has new injury to the lateral aspect of the last issue a pressure ulcer, he states this is from adhesive removal trauma. He states he has tried multiple adhesive products with no success. All other ulcers appear stable. The left heel DTI is resolving. We will continue with same treatment plan and follow-up next week. 02/14/18; follow-up for multiple areas. ooHe has a new area last week on the lateral aspect of his pressure ulcer  more over the posterior trochanter. The original pressure ulcer looks quite stable has healthy granulation. 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 Almost fully epithelialized on the original superior area using Hydrofera Blue ooDTI on the left heel has peeled off this week to reveal a small superficial wound under denuded skin and subcutaneous tissue ooBoth areas between the first and second toes look better including nothing open on the left 02/21/18; ooThe patient's wounds on his left ischial tuberosity and posterior left greater trochanter actually looked better. He has a large area of irritation around the area which I think is contact dermatitis. I am doubtful that this is fungal ooHis original wound on the left lateral calf continues to improve we have been using Hydrofera Blue ooThere is no open area in the left first second toe web space although there is a lot of thick callus ooThe DTI on the left heel required debridement today of necrotic surface eschar and subcutaneous tissue using silver alginate ooFinally the area on the right first second toe webspace continues to contract using silver alginate and ketoconazole 02/28/18 ooLeft ischial tuberosity wounds look better using silver alginate. ooOriginal wound on the left calf only has one small open area left using Hydrofera Blue ooDTI on the left heel required debridement mostly removing skin from around  this wound surface. Using silver alginate ooThe areas on the right first/second toe web space using silver alginate and ketoconazole 03/08/18 on evaluation today patient appears to be doing decently well as best I can tell in regard to his wounds. This is the first time that I have seen him as he generally is followed by Dr. Dellia Nims. With that being said none of his wounds appear to be infected he does have an area where  there is some skin covering what appears to be a new wound on the left dorsal surface of his great toe. This is right at the nail bed. With that being said I do believe that debrided away some of the excess skin can be of benefit in this regard. Otherwise he has been tolerating the dressing changes without complication. 03/14/18; patient arrives today with the multiplicity of wounds that we are following. He has not been systemically unwell ooOriginal wound on the left lateral calf now only has 2 small open areas we've been using Hydrofera Blue which should continue ooThe deep tissue injury on the left heel requires debridement today. We've been using silver alginate ooThe left first second toe and the right first second toe are both are reminiscence what I think was tinea pedis. Apparently some of the callus Surface between the toes was removed last week when it started draining. ooPurulent drainage coming from the wound on the ischial tuberosity on the left. 03/21/18-He is here in follow-up evaluation for multiple wounds. 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 He tolerated debridement. The only change to last week's recommendations is to discontinue antifungal cream between toes. He will follow-up next week 03/28/18; following up for multiple wounds;Concern this week is streaking redness and swelling in the right foot. He is going to need antibiotics for this. 03/31/18; follow-up for right foot cellulitis. Streaking redness and swelling in the right foot on 03/28/18. 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 I put him on clindamycin 300 mg every 6 and brought him in for a quick check. He has an open wound between his first and second toes on the right foot as a potential source. 04/04/18; ooRight foot cellulitis is resolving he is completing clindamycin. This is truly good news ooLeft lateral calf  wound which is initial wound only has one small open area inferiorly this is close to healing out. He has compression stockings. We will use Hydrofera Blue right down to the epithelialization of this ooNonviable surface on the left heel which was initially pressure with a DTI. We've been using Hydrofera Blue. I'm going to switch this back to silver alginate ooLeft first second toe/tinea pedis this looks better using silver alginate ooRight first second toe tinea pedis using silver alginate ooLarge pressure ulcers on theLeft ischial tuberosity. Small wound here Looks better. I am uncertain about the surface over the large wound. Using silver alginate 04/11/18; ooCellulitis in the right foot is resolved ooLeft lateral calf wound which was his original wounds still has 2 tiny open areas remaining this is just about closed ooNonviable surface on the left heel is better but still requires debridement ooLeft first second toe/tinea pedis still open using silver alginate ooRight first second toe wound tinea pedis I asked him to go back to using ketoconazole and silver alginate ooLarge pressure ulcers on the left ischial tuberosity this shear injury here is resolved. Wound is smaller. No evidence of infection using silver alginate 04/18/18;   ooPatient arrives with an intense area of cellulitis in the right mid lower calf extending into the right heel area. Bright red and warm. Smaller area on the left anterior leg. He has a significant history of MRSA. He will definitely need antibioticsoodoxycycline ooHe now has 2 open areas on the left ischial tuberosity the original large wound and now a satellite area which I think was above his initial satellite areas. Not a wonderful surface on this satellite area surrounding erythema which looks like pressure related. ooHis left lateral calf wound again his original wound is just about closed ooLeft heel pressure injury still requiring  debridement ooLeft first second toe looks a lot better using silver alginate ooRight first second toe also using silver alginate and ketoconazole cream also looks better 04/20/18; the patient was worked in early today out of concerns with his cellulitis on the right leg. I had started him on doxycycline. This was 2 days ago. His wife was concerned about the swelling in the area. Also concerned about the left buttock. He has not been systemically unwell no fever chills. No nausea vomiting or diarrhea 04/25/18; the patient's left buttock wound is continued to deteriorate he is using Hydrofera Blue. He is still completing clindamycin for the cellulitis on the right leg although all of this looks better. 05/02/18 ooLeft buttock wound still with a lot of drainage and a very tightly adherent fibrinous necrotic surface. He has a deeper area superiorly ooThe left lateral calf wound is still closed ooDTI wound on the left heel necrotic surface especially the circumference using Iodoflex ooAreas between his left first second toe and right first second toe both look better. Dorsally and the right first second toe he had a necrotic surface although at smaller. In using silver alginate and ketoconazole. I did a culture last week which was a deep tissue culture of the reminiscence of the open wound on the right first second toe dorsally. This grew a few Acinetobacter and a few methicillin-resistant staph aureus. Nevertheless the area actually this week looked better. I didn't feel the need to specifically address this at least in terms of systemic antibiotics. 05/09/18; wounds are measuring larger more drainage per our intake. We are using Santyl covered with alginate on the large superficial buttock wounds, Iodosorb on the left heel, ketoconazole and silver alginate to the dorsal first and second toes bilaterally. 05/16/18; ooThe area on his left buttock better in some aspects although the area superiorly  over the ischial tuberosity required an extensive debridement.using Santyl ooLeft heel appears stable. Using Iodoflex ooThe areas between his first and second toes are not bad however there is spreading erythema up the dorsal aspect of his left foot this looks like cellulitis again. He is insensate the erythema is really very brilliant.o Erysipelas He went to see an allergist days ago because he was itching part of this he had lab work done. This showed a white count of 15.1 with 70% neutrophils. Hemoglobin of 11.4 and a platelet count of 659,000. Last white count we had in Epic was a 2-1/2 years ago which was 25.9 but he was ill at the time. He was able to show me some lab work that was done by his primary physician the pattern is about the same. 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 But prompted me to go ahead and do x-rays of both feet and the pelvis rule out osteomyelitis. He also had a comprehensive metabolic  panel this was reasonably normal his albumin was 3.7 liver function tests BUN/creatinine all normal 05/23/18; x-rays of both his feet from last week were negative for underlying pulmonary abnormality. The x-ray of his pelvis however showed mild irregularity in the left ischial which may represent some early osteomyelitis. The wound in the left ischial continues to get deeper clearly now exposed muscle. Each week necrotic surface material over this area. Whereas the rest of the wounds do not look so bad. ooThe left ischial wound we have been using Santyl and calcium alginate ooT the left heel surface necrotic debris using Iodoflex o ooThe left lateral leg is still healed ooAreas on the left dorsal foot and the right dorsal foot are about the same. There is some inflammation on the left which might represent contact dermatitis, fungal dermatitis I am doubtful cellulitis although this looks better than last week 05/30/18; CT scan done at  Hospital did not show any osteomyelitis or abscess. Suggested the possibility of underlying cellulitis although I don't see a lot of evidence of this at the bedside ooThe wound itself on the left buttock/upper thigh actually looks somewhat better. No debridement ooLeft heel also looks better no debridement continue Iodoflex ooBoth dorsal first second toe spaces appear better using Lotrisone. Left still required debridement 06/06/18; ooIntake reported some purulent looking drainage from the left gluteal wound. Using Santyl and calcium alginate ooLeft heel looks better although still a nonviable surface requiring debridement ooThe left dorsal foot first/second webspace actually expanding and somewhat deeper. I may consider doing a shave biopsy of this area ooRight dorsal foot first/second webspace appears stable to improved. Using Lotrisone and silver alginate to both these areas 06/13/18 ooLeft gluteal surface looks better. Now separated in the 2 wounds. No debridement required. Still drainage. We'll continue silver alginate ooLeft heel continues to look better with Iodoflex continue this for at least another week ooOf his dorsal foot wounds the area on the left still has some depth although it looks better than last week. We've been using Lotrisone and silver alginate 06/20/18 ooLeft gluteal continues to look better healthy tissue ooLeft heel continues to look better healthy granulation wound is smaller. He is using Iodoflex and his long as this continues continue the Iodoflex ooDorsal right foot looks better unfortunately dorsal left foot does not. There is swelling and erythema of his forefoot. He had minor trauma to this several days ago but doesn't think this was enough to have caused any tissue injury. Foot looks like cellulitis, we have had this problem before 06/27/18 on evaluation today patient appears to be doing a little worse in regard to his foot ulcer. Unfortunately it does  appear that he has methicillin-resistant staph aureus and unfortunately there really are no oral options for him as he's allergic to sulfa drugs as well as I box. Both of which would really be his only options for treating this infection. In the past he has been given and effusion of Orbactiv. This is done very well for him in the past again it's one time dosing IV antibiotic therapy. Subsequently I do believe this is something we're gonna need to see about doing at this point in time. Currently his other wounds seem to be doing somewhat better in my pinion I'm pretty happy in that regard. 07/03/18 on evaluation today patient's wounds actually appear to be doing fairly well. He has been tolerating the dressing changes without complication. All in all he seems to be showing signs of improvement. In regard  to the antibiotics he has been dealing with infectious disease since I saw him last week as far as getting this scheduled. In the end [facility]'s [facility] this coming Friday. In the meantime he has been continuing to perform the dressing changes in such as previous. There does not appear to be any evidence of infection worsengin at this time. 07/10/18; ooSince I last saw this man 2 weeks ago things have actually improved. IV antibiotics of resulted in less forefoot erythema although there is still some present. He is not systemically unwell ooLeft buttock wounds o2 now have no depth there is increased epithelialization Using silver alginate ooLeft heel still requires debridement using Iodoflex ooLeft dorsal foot still with a sizable wound about the size of a border but healthy granulation ooRight dorsal foot still with a slitlike area using silver alginate 07/18/18; the patient's cellulitis in the left foot is improved in fact I think it is on its way to resolving. ooLeft buttock wounds o2 both look better although the larger one has  hypertension granulation we've been using silver alginate ooLeft heel has some thick circumferential redundant skin over the wound edge which will need to be removed today we've been using Iodoflex ooLeft dorsal foot is still a sizable wound required debridement using silver alginate ooThe right dorsal foot is just about closed only a small open area remains here 07/25/18; left foot cellulitis is resolved ooLeft buttock wounds o2 both look better. Hyper-granulation on the major area ooLeft heel as some debris over the surface but otherwise looks a healthier wound. Using silver collagen ooRight dorsal foot is just about closed 07/31/18; arrives with our intake nurse worried about purulent drainage from the buttock. We had hyper-granulation here last week ooHis buttock wounds o2 continue to look better ooLeft heel some debris over the surface but measuring smaller. ooRight dorsal foot unfortunately has openings between the toes ooLeft foot superficial wound looks less aggravated. 08/07/18 ooButtock wounds continue to look better although some of her granulation and the larger medial wound. silver alginate ooLeft heel continues to look a lot better.silver collagen ooLeft foot superficial wound looks less stable. Requires debridement. He has a new wound superficial area on the foot on the lateral dorsal foot. ooRight foot looks better using silver alginate without Lotrisone 08/14/2018; patient was in the ER last week diagnosed with a UTI. He is now on Cefpodoxime and Macrodantin. ooButtock wounds continued to be smaller. Using silver alginate ooLeft heel continues to look better using silver collagen ooLeft foot superficial wound looks as though it is improving ooRight dorsal foot area is just about healed. 08/21/2018; patient is completed his antibiotics for his UTI. ooHe has 2 open areas on the buttocks. There is still not closed although the surface looks satisfactory. Using  silver alginate ooLeft heel continues to improve using silver collagen ooThe bilateral dorsal foot areas which are at the base of his first and second toes/possible tinea pedis are actually stable on the left but worse on the right. The area on the left required debridement of necrotic surface. After debridement I obtained a specimen for PCR culture. ooThe right dorsal foot which is been just about healed last week is now reopened 08/28/2018; culture done on the left dorsal foot showed coag negative staph both staph epidermidis and Lugdunensis. I think this is worthwhile initiating systemic treatment. I will use doxycycline given his long list of allergies. The area on the  left heel slightly improved but still requiring debridement. ooThe large wound on the buttock is just about closed whereas the smaller one is larger. Using silver alginate in this area 09/04/2018; patient is completing his doxycycline for the left foot although this continues to be a very difficult wound area with very adherent necrotic debris. We are using silver alginate to all his wounds right foot left foot and the small wounds on his buttock, silver collagen on the left heel. 09/11/2018; once again this patient has intense erythema and swelling of the left forefoot. Lesser degrees of erythema in the right foot. He has a long list of allergies and intolerances. I will reinstitute doxycycline. 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 Using silver alginate ooLeft heel also looks better using silver collagen ooUnfortunately both the areas on his feet look worse. The area on the left first second webspace is now gone through to the plantar part of his foot. The area on the left foot anteriorly is irritated with erythema and swelling in the forefoot. 09/25/2018 ooHis wound on the left plantar heel looks better. Using silver collagen ooThe area on the left buttock 2 small  remnant areas. One is closed one is still open. Using silver alginate ooThe areas between both his first and second toes look worse. This in spite of long-standing antifungal therapy with ketoconazole and silver alginate which should have antifungal activity ooHe has small areas around his original wound on the left calf one is on the bottom of the original scar tissue and one superiorly both of these are small and superficial but again given wound history in this site this is worrisome 10/02/2018 ooLeft plantar heel continues to gradually contract using silver collagen ooLeft buttock wound is unchanged using silver alginate ooThe areas on his dorsal feet between his first and second toes bilaterally look about the same. 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 Small open area here. We have been using silver alginate 10/09/2018; ooLeft plantar heel continues to look healthy and contract. No debridement is required ooLeft buttock slightly smaller with a tape injury wound just below which was new this week ooDorsal feet somewhat improved I have been using clindamycin ooLeft lateral looks lower extremity the actual open area looks worse although a lot of this is epithelialized. I am going to change to silver collagen today He has a lot more swelling in the right leg although this is not pitting not red and not particularly warm there is a lot of spasm in the right leg usually indicative of people with paralysis of some underlying discomfort. We have reviewed his vascular status from 2017 he had a left greater saphenous vein ablation. I wonder about referring him back to vascular surgery if the area on the left leg continues to deteriorate. 10/16/2018 in today for follow-up and management of multiple lower  extremity ulcers. His left Buttock wound is much lower smaller and almost closed completely. The wound to the left ankle has began to reopen with Epithelialization and some adherent slough. He has multiple new areas to the left foot and leg. The left dorsal foot without much improvement. Wound present between left great webspace and 2nd toe. Erythema and edema present right leg. Right LE ultrasound obtained on 10/10/18 was negative for  DVT . 10/23/2018; ooLeft buttock is closed over. Still dry macerated skin but there is no open wound. I suspect this is chronic pressure/moisture ooLeft lateral calf is quite a bit worse than when I saw this last. There is clearly drainage here he has macerated skin into the left plantar heel. We will change the primary dressing to alginate ooLeft dorsal foot has some improvement in overall wound area. Still using clindamycin and silver alginate ooRight dorsal foot about the same as the left using clindamycin and silver alginate ooThe erythema in the right leg has resolved. He is DVT rule out was negative ooLeft heel pressure area required debridement although the wound is smaller and the surface is health 10/26/2018 ooThe patient came back in for his nurse check today predominantly because of the drainage coming out of the left lateral leg with a recent reopening of his original wound on the left lateral calf. He comes in today with a large amount of surrounding erythema around the wound extending from the calf into the ankle and even in the area on the dorsal foot. He is not systemically unwell. He is not febrile. Nevertheless this looks like cellulitis. We have been using silver alginate to the area. I changed him to a regular visit and I am going to prescribe him doxycycline. The rationale here is a long list of medication intolerances and a history of MRSA. I did not see anything that I thought would provide a valuable culture 10/30/2018 ooFollow-up from  his appointment 4 days ago with really an extensive area of cellulitis in the left calf left lateral ankle and left dorsal foot. I put him on doxycycline. He has a long list of medication allergies which are true allergy reactions. 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 In any case he is a lot better today. The erythema especially in the anterior and lateral left calf is better. He still has left ankle erythema. He also is complaining about increasing edema in the right leg we have only been using Kerlix Coban and he has been doing the wraps at home. Finally he has a spotty rash on the medial part of his upper left calf which looks like folliculitis or perhaps wrap occlusion type injury. Small superficial macules not pustules 11/06/18 patient arrives today with again a considerable degree of erythema around the wound on the left lateral calf extending into the dorsal ankle and dorsal foot. This is a lot worse than when I saw this last week. He is on doxycycline really with not a lot of improvement. He has not been systemically unwell Wounds on the; left heel actually looks improved. Original area on the left foot and proximity to the first and second toes looks about the same. He has superficial areas on the dorsal foot, anterior calf and then the reopening of his original wound on the left lateral calf which looks about the same ooThe only area he has on the right is the dorsal webspace first and second which is smaller. ooHe has a large area of dry erythematous skin on the left buttock small open area here. 11/13/2018; the patient arrives in much better condition. The erythema around the wound on the left lateral calf is a lot better. Not sure whether this was the clindamycin or the TCA and ketoconazole or just in the improvement in edema control [stasis dermatitis]. In any case this is a lot better. The area on  the left heel  is very small and just about resolved using silver collagen we have been using silver alginate to the areas on his dorsal feet 11/20/2018; his wounds include the left lateral calf, left heel, dorsal aspects of both feet just proximal to the first second webspace. He is stable to slightly improved. I did not think any changes to his dressings were going to be necessary 11/27/2018 he has a reopening on the left buttock which is surrounded by what looks like tinea or perhaps some other form of dermatitis. The area on the left dorsal foot has some erythema around it I have marked this area but I am not sure whether this is cellulitis or not. Left heel is not closed. Left calf the reopening is really slightly longer and probably worse 1/13; in general things look better and smaller except for the left dorsal foot. Area on the left heel is just about closed, left buttock looks better only a small wound remains in the skin looks better [using Lotrisone] 1/20; the area on the left heel only has a few remaining open areas here. Left lateral calf about the same in terms of size, left dorsal foot slightly larger right lateral foot still not closed. The area on the left buttock has no open wound and the surrounding skin looks a lot better 1/27; the area on the left heel is closed. Left lateral calf better but still requiring extensive debridements. The area on his left buttock is closed. He still has the open areas on the left dorsal foot which is slightly smaller in the right foot which is slightly expanded. We have been using Iodoflex on these areas as well 2/3; left heel is closed. Left lateral calf still requiring debridement using Iodoflex there is no open area on his left buttock however he has dry scaly skin over a large area of this. Not really responding well to the Lotrisone. Finally the areas on his dorsal feet at the level of the first second webspace are slightly smaller on the right and about the same  on the left. Both of these vigorously debrided with Anasept and gauze 2/10; left heel remains closed he has dry erythematous skin over the left buttock but there is no open wound here. Left lateral leg has come in and with. Still requiring debridement we have been using Iodoflex here. Finally the area on the left dorsal foot and right dorsal foot are really about the same extremely dry callused fissured areas. He does not yet have a dermatology appointment 2/17; left heel remains closed. He has a new open area on the left buttock. The area on the left lateral calf is bigger longer and still covered in necrotic debris. No major change in his foot areas bilaterally. I am awaiting for a dermatologist to look on this. We have been using ketoconazole I do not know that this is been doing any good at all. 2/24; left heel remains closed. The left buttock wound that was new reopening last week looks better. The left lateral calf appears better also although still requires debridement. The major area on his foot is the left first second also requiring debridement. We have been putting Prisma on all wounds. I do not believe that the ketoconazole has done too much good for his feet. He will use Lotrisone I am going to give him a 2-week course of terbinafine. We still do not have a dermatology appointment 3/2 left heel remains closed however there is skin  over bone in this area I pointed this out to him today. The left buttock wound is epithelialized but still does not look completely stable. The area on the left leg required debridement were using silver collagen here. With regards to his feet we changed to Lotrisone last week and silver alginate. 3/9; left heel remains closed. Left buttock remains closed. The area on the right foot is essentially closed. The left foot remains unchanged. Slightly smaller on the left lateral calf. Using silver collagen to both of these areas 3/16-Left heel remains closed. Area  on right foot is closed. Left lateral calf above the lateral malleolus open wound requiring debridement with easy bleeding. Left dorsal wound proximal to first toe also debrided. Left ischial area open new. Patient has been using Prisma with wrapping every 3 days. Dermatology appointment is apparently tomorrow.Patient has completed his terbinafine 2-week course with some apparent improvement according to him, there is still flaking and dry skin in his foot on the left 3/23; area on the right foot is reopened. The area on the left anterior foot is about the same still a very necrotic adherent surface. He still has the area on the left leg and reopening is on the left buttock. He apparently saw dermatology although I do not have a note. According to the patient who is usually fairly well informed they did not have any good ideas. Put him on oral terbinafine which she is been on before. 3/30; using silver collagen to all wounds. Apparently his dermatologist put him on doxycycline and rifampin presumably some culture grew staph. I do not have this result. He remains on terbinafine although I have used terbinafine on him before 4/6; patient has had a fairly substantial reopening on the right foot between the first and second toes. He is finished his terbinafine and I believe is on doxycycline and rifampin still as prescribed by dermatology. We have been using silver collagen to all his wounds although the patient reports that he thinks silver alginate does better on the wounds on his buttock. 4/13; the area on his left lateral calf about the same size but it did not require debridement. ooLeft dorsal foot just proximal to the webspace between the first and second toes is about the same. Still nonviable surface. I note some superficial bronze discoloration of the dorsal part of his foot ooRight dorsal foot just proximal to the first and second toes also looks about the same. I still think there may be  the same discoloration I noted above on the left ooLeft buttock wound looks about the same 4/20; left lateral calf appears to be gradually contracting using silver collagen. 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 The left dorsal foot wound is debrided of tightly adherent necrotic debris and really cleans up quite nicely. The right area is worse with expansion. I did not debride this it is now over the base of the second toe ooThe area on his left buttock is smaller no debridement is required using silver collagen 5/4; left calf continues to make good progress. ooHe arrives with erythema around the wounds on his dorsal foot which even extends to the plantar aspect. Very concerning for coexistent infection. He is finished the erythromycin I gave him for possible erythrasma this does not seem to have helped. ooThe area on the left foot is about the same base of the dorsal toes ooIs area on the buttock looks improved on the left 5/11; left calf  and left buttock continued to make good progress. Left foot is about the same to slightly improved. ooMajor problem is on the right foot. He has not had an x-ray. Deep tissue culture I did last week showed both Enterobacter and E. coli. I did not change the doxycycline I put him on empirically although neither 1 of these were plated to doxycycline. He arrives today with the erythema looking worse on both the dorsal and plantar foot. Macerated skin on the bottom of the foot. he has not been systemically unwell 5/18-Patient returns at 1 week, left calf wound appears to be making some progress, left buttock wound appears slightly worse than last time, left foot wound looks slightly better, right foot redness is marginally better. X-ray of both feet show no air or evidence of osteomyelitis. Patient is finished his Omnicef and terbinafine. He continues to have macerated skin on the bottom of the  left foot as well as right 5/26; left calf wound is better, left buttock wound appears to have multiple small superficial open areas with surrounding macerated skin. X-rays that I did last time showed no evidence of osteomyelitis in either foot. He is finished cefdinir and doxycycline. I do not think that he was on terbinafine. He continues to have a large superficial open area on the right foot anterior dorsal and slightly between the first and second toes. I did send him to dermatology 2 months ago or so wondering about whether they would do a fungal scraping. I do not believe they did but did do a culture. We have been using silver alginate to the toe areas, he has been using antifungals at home topically either ketoconazole or Lotrisone. We are using silver collagen on the left foot, silver alginate on the right, silver collagen on the left lateral leg and silver alginate on the left buttock 6/1; left buttock area is healed. We have the left dorsal foot, left lateral leg and right dorsal foot. We are using silver alginate to the areas on both feet and silver collagen to the area on his left lateral calf 6/8; the left buttock apparently reopened late last week. He is not really sure how this happened. He is tolerating the terbinafine. Using silver alginate to all wounds 6/15; left buttock wound is larger than last week but still superficial. ooCame in the clinic today with a report of purulence from the left lateral leg I did not identify any infection ooBoth areas on his dorsal feet appear to be better. He is tolerating the terbinafine. Using silver alginate to all wounds 6/22; left buttock is about the same this week, left calf quite a bit better. His left foot is about the same however he comes in with erythema and warmth in the right forefoot once again. Culture that I gave him in the beginning of May showed Enterobacter and E. coli. I gave him doxycycline and things seem to improve although  neither 1 of these organisms was specifically plated. 6/29; left buttock is larger and dry this week. Left lateral calf looks to me to be improved. Left dorsal foot also somewhat improved right foot completely unchanged. The erythema on the right foot is still present. He is completing the Ceftin dinner that I gave him empirically [see discussion above.) 7/6 - All wounds look to be stable and perhaps improved, the left buttock wound is slightly smaller, per patient bleeds easily, completed ceftin, the right foot redness is less, he is on terbinafine 7/13; left buttock wound about  the same perhaps slightly narrower. Area on the left lateral leg continues to narrow. Left dorsal foot slightly smaller right foot about the same. We are using silver alginate on the right foot and Hydrofera Blue to the areas on the left. Unna boot on the left 2 layer compression on the right 7/20; left buttock wound absolutely the same. Area on lateral leg continues to get better. Left dorsal foot require debridement as did the right no major change in the 7/27; left buttock wound the same size necrotic debris over the surface. The area on the lateral leg is closed once again. His left foot looks better right foot about the same although there is some involvement now of the posterior first second toe area. He is still on terbinafine which I have given him for a month, not certain a centimeter major change 06/25/19-All wounds appear to be slightly improved according to report, left buttock wound looks clean, both foot wounds have minimal to no debris the right dorsal foot has minimal slough. We are using Hydrofera Blue to the left and silver alginate to the right foot and ischial wound. 8/10-Wounds all appear to be around the same, the right forefoot distal part has some redness which was not there before, however the wound looks clean and small. Ischial wound looks about the same with no changes 8/17; his wound on the left  lateral calf which was his original chronic venous insufficiency wound remains closed. Since I last saw him the areas on the left dorsal foot right dorsal foot generally appear better but require debridement. The area on his left initial tuberosity appears somewhat larger to me perhaps hyper granulated and bleeds very easily. We have been using Hydrofera Blue to the left dorsal foot and silver alginate to everything else 8/24; left lateral calf remains closed. The areas on his dorsal feet on the webspace of the first and second toes bilaterally both look better. The area on the left buttock which is the pressure ulcer stage II slightly smaller. I change the dressing to Hydrofera Blue to all areas 8/31; left lateral calf remains closed. The area on his dorsal feet bilaterally look better. Using Hydrofera Blue. Still requiring debridement on the left foot. No change in the left buttock pressure ulcers however 9/14; left lateral calf remains closed. Dorsal feet look quite a bit better than 2 weeks ago. Flaking dry skin also a lot better with the ammonium lactate I gave him 2 weeks ago. The area on the left buttock is improved. He states that his Roho cushion developed a leak and he is getting a new one, in the interim he is offloading this vigorously 9/21; left calf remains closed. Left heel which was a possible DTI looks better this week. He had macerated tissue around the left dorsal foot right foot looks satisfactory and improved left buttock wound. I changed his dressings to his feet to silver alginate bilaterally. Continuing Hydrofera Blue on the left buttock. 9/28 left calf remains closed. Left heel did not develop anything [possible DTI] dry flaking skin on the left dorsal foot. Right foot looks satisfactory. Improved left buttock wound. We are using silver alginate on his feet Hydrofera Blue on the buttock. I have asked him to go back to the Lotrisone on his feet including the wounds and  surrounding areas 10/5; left calf remains closed. The areas on the left and right feet about the same. A lot of this is epithelialized however debris over the remaining open areas.  He is using Lotrisone and silver alginate. The area on the left buttock using Hydrofera Blue 10/26. Patient has been out for 3 weeks secondary to Covid concerns. He tested negative but I think his wife tested positive. He comes in today with the left foot substantially worse, right foot about the same. Even more concerning he states that the area on his left buttock closed over but then reopened and is considerably deeper in one aspect than it was before [stage III wound] 11/2; left foot really about the same as last week. Quarter sized wound on the dorsal foot just proximal to the first second toes. Surrounding erythema with areas of denuded epithelium. This is not really much different looking. Did not look like cellulitis this time however. ooRight foot area about the same.. We have been using silver alginate alginate on his toes ooLeft buttock still substantial irritated skin around the wound which I think looks somewhat better. We have been using Hydrofera Blue here. 11/9; left foot larger than last week and a very necrotic surface. Right foot I think is about the same perhaps slightly smaller. Debris around the circumference also addressed. Unfortunately on the left buttock there is been a decline. Satellite lesions below the major wound distally and now a an additional one posteriorly we have been using Hydrofera Blue but I think this is a pressure issue 11/16; left foot ulcer dorsally again a very adherent necrotic surface. Right foot is about the same. Not much change in the pressure ulcer on his left buttock. 11/30; left foot ulcer dorsally basically the same as when I saw him 2 weeks ago. Very adherent fibrinous debris on the wound surface. Patient reports a lot of drainage as well. The character of this wound  has changed completely although it has always been refractory. We have been using Iodoflex, patient changed back to alginate because of the drainage. Area on his right dorsal foot really looks benign with a healthier surface certainly a lot better than on the left. Left buttock wounds all improved using Hydrofera Blue 12/7; left dorsal foot again no improvement. Tightly adherent debris. PCR culture I did last week only showed likely skin contaminant. I have gone ahead and done a punch biopsy of this which is about the last thing in terms of investigations I can think to do. He has known venous insufficiency and venous hypertension and this could be the issue here. The area on the right foot is about the same left buttock slightly worse according to our intake nurse secondary to [facility] Blue sticking to the wound 12/14; biopsy of the left foot that I did last time showed changes that could be related to wound healing/chronic stasis dermatitis phenomenon no neoplasm. We have been using silver alginate to both feet. I change the one on the left today to Sorbact and silver alginate to his other 2 wounds 12/28; the patient arrives with the following problems; ooMajor issue is the dorsal left foot which continues to be a larger deeper wound area. Still with a completely nonviable surface ooParadoxically the area mirror image on the right on the right dorsal foot appears to be getting better. ooHe had some loss of dry denuded skin from the lower part of his original wound on the left lateral calf. Some of this area looked a little vulnerable and for this reason we put him in wrap that on this side this week ooThe area on his left buttock is larger. He still has the erythematous circular  area which I think is a combination of pressure, sweat. This does not look like cellulitis or fungal dermatitis 11/26/2019; -Dorsal left foot large open wound with depth. Still debris over the surface. Using  Sorbact ooThe area on the dorsal right foot paradoxically has closed over [facility] has a reopening on the left ankle laterally at the base of his original wound that extended up into the calf. This appears clean. ooThe left buttock wound is smaller but with very adherent necrotic debris over the surface. We have been using silver alginate here as well The patient had arterial studies done in 2017. He had biphasic waveforms at the dorsalis pedis and posterior tibial bilaterally. ABI in the left was 1.17. Digit waveforms were dampened. He has slight spasticity in the great toes I do not think a TBI would be possible 1/11; the patient comes in today with a sizable reopening between the first and second toes on the right. This is not exactly in the same location where we have been treating wounds previously. According to our intake nurse this was actually fairly deep but 0.6 cm. The area on the left dorsal foot looks about the same the surface is somewhat cleaner using Sorbact, his MRI is in 2 days. We have not managed yet to get arterial studies. The new reopening on the left lateral calf looks somewhat better using alginate. The left buttock wound is about the same using alginate 1/18; the patient had his ARTERIAL studies which were quite normal. ABI in the right at 1.13 with triphasic/biphasic waveforms on the left ABI 1.06 again with triphasic/biphasic waveforms. It would not have been possible to have done a toe brachial index because of spasticity. We have been using Sorbac to the left foot alginate to the rest of his wounds on the right foot left lateral calf and left buttock 1/25; arrives in clinic with erythema and swelling of the left forefoot worse over the first MTP area. This extends laterally dorsally and but also posteriorly. Still has an area on the left lateral part of the lower part of his calf wound it is eschared and clearly not closed. ooArea on the left buttock still with surrounding  irritation and erythema. ooRight foot surface wound dorsally. The area between the right and first and second toes appears better. 2/1; ooThe left foot wound is about the same. Erythema slightly better I gave him a week of doxycycline empirically ooRight foot wound is more extensive extending between the toes to the plantar surface ooLeft lateral calf really no open surface on the inferior part of his original wound however the entire area still looks vulnerable ooAbsolutely no improvement in the left buttock wound required debridement. 2/8; the left foot is about the same. Erythema is slightly improved I gave him clindamycin last week. ooRight foot looks better he is using Lotrimin and silver alginate ooHe has a breakdown in the left lateral calf. Denuded epithelium which I have removed ooLeft buttock about the same were using Hydrofera Blue 2/15; left foot is about the same there is less surrounding erythema. Surface still has tightly adherent debris which I have debriding however not making any progress ooRight foot has a substantial wound on the medial right second toe between the first and second webspace. ooStill an open area on the left lateral calf distal area. ooButtock wound is about the same 2/22; left foot is about the same less surrounding erythema. Surface has adherent debris. Polymen Ag Right foot area significant wound between the  first and second toes. We have been using silver alginate here Left lateral leg polymen Ag at the base of his original venous insufficiency wound ooLeft buttock some improvement here 3/1; ooRight foot is deteriorating in the first second toe webspace. Larger and more substantial. We have been using silver alginate. ooLeft dorsal foot about the same markedly adherent surface debris using PolyMem Ag ooLeft lateral calf surface debris using PolyMem AG ooLeft buttock is improved again using PolyMem Ag. ooHe is completing his  terbinafine. The erythema in the foot seems better. He has been on this for 2 weeks 3/8; no improvement in any wound area in fact he has a small open area on the dorsal midfoot which is new this week. He has not gotten his foot x-rays yet 3/15; his x-rays were both negative for osteomyelitis of both feet. No major change in any of his wounds on the extremities however his buttock wounds are better. We have been using polymen on the buttocks, left lower leg. Iodoflex on the left foot and silver alginate on the right 3/22; arrives in clinic today with the 2 major issues are the improvement in the left dorsal foot wound which for once actually looks healthy with a nice healthy wound surface without debridement. Using Iodoflex here. Unfortunately on the left lateral calf which is in the distal part of his original wound he came to the clinic here for there was purulent drainage noted some increased breakdown scattered around the original area and a small area proximally. We we are using polymen here will change to silver alginate today. His buttock wound on the left is better and I think the area on the right first second toe webspace is also improved 3/29; left dorsal foot looks better. Using Iodoflex. Left ankle culture from deterioration last time grew E. coli, Enterobacter and Enterococcus. I will give him a course of cefdinir although that will not cover Enterococcus. The area on the right foot in the webspace of the first and second toe lateral first toe looks better. The area on his buttock is about healed Vascular appointment is on April 21. This is to look at his venous system vis--vis continued breakdown of the wounds on the left including the left lateral leg and left dorsal foot he. He has had previous ablations on this side 4/5; the area between the right first and second toes lateral aspect of the first toe looks better. Dorsal aspect of the left first toe on the left foot also improved.  Unfortunately the left lateral lower leg is larger and there is a second satellite wound superiorly. The usual superficial abrasions on the left buttock overall better but certainly not closed 4/12; the area between the right first and second toes is improved. Dorsal aspect of the left foot also slightly smaller with a vibrant healthy looking surface. No real change in the left lateral leg and the left buttock wound is healed He has an unaffordable co-pay for Apligraf. Appointment with vein and vascular with regards to the left leg venous part of the circulation is on 4/21 4/19; we continue to see improvement in all wound areas. Although this is minor. He has his vascular appointment on 4/21. The area on the left buttock has not reopened although right in the center of this area the skin looks somewhat threatened 4/26; the left buttock is unfortunately reopened. In general his left dorsal foot has a healthy surface and looks somewhat smaller although it was not measured as such.  The area between his first and second toe webspace on the right as a small wound against the first toe. The patient saw vascular surgery. The real question I was asking was about the small saphenous vein on the left. He has previously ablated left greater saphenous vein. Nothing further was commented on on the left. Right greater saphenous vein without reflux at the saphenofemoral junction or proximal thigh there was no indication for ablation of the right greater saphenous vein duplex was negative for DVT bilaterally. They did not think there was anything from a vascular surgery point of view that could be offered. They ABIs within normal limits 5/3; only small open area on the left buttock. The area on the left lateral leg which was his original venous reflux is now 2 wounds both which look clean. We are using Iodoflex on the left dorsal foot which looks healthy and smaller. He is down to a very tiny area between the right  first and second toes, using silver alginate 5/10; all of his wounds appear better. We have much better edema control in 4 layer compression on the left. This may be the factor that is allowing the left foot and left lateral calf to heal. He has external compression garments at home 04/14/20-All of his wounds are progressing well, the left forefoot is practically closed, left ischium appears to be about the same, right toe webspace is also smaller. The left lateral leg is about the same, continue using Hydrofera Blue to this, silver alginate to the ischium, Iodoflex to the toe space on the right 6/7; most of his wounds outside of the left buttock are doing well. The area on the left lateral calf and left dorsal foot are smaller. The area on the right foot in between the first and second toe webspace is barely visible although he still says there is some drainage here is the only reason I did not heal this out. ooUnfortunately the area on the left buttock almost looks like he has a skin tear from tape. He has open wound and then a large flap of skin that we are trying to get adherence over an area just next to the remaining wound Objective Constitutional Sitting or standing Blood Pressure is within target range for patient.. Pulse regular and within target range for patient.Marland Kitchen Respirations regular, non-labored and within target range.. Temperature is normal and within the target range for the patient.Marland Kitchen Appears in no distress. Vitals Time Taken: 8:10 AM, Height: 70 in, Weight: 216 lbs, BMI: 31, Temperature: 98.4 F, Pulse: 109 bpm, Respiratory Rate: 18 breaths/min, Blood Pressure: 115/76 mmHg. General Notes: Wound exam ooLeft ischial wound has a skin tear. I am not sure if the flap of skin here is going to remain viable. It is just medial to the remaining wound. ooLeft lateral leg is smaller. I used a #5 curette to remove some surrounding skin and callus ooLeft dorsal foot also removed some thick  callus from the margins of the wound. This is much smaller ooThe area between the first and second toes is almost invisible although he still says there is some drainage. I had trouble seeing anything that was still open. There is a small divot here however I wonder if this is where the drainage is coming from Integumentary (Hair, Skin) Wound #24 status is Open. Original cause of wound was Gradually Appeared. The wound is located on the Left,Dorsal Foot. The wound measures 0.1cm length x 0.1cm width x 0.1cm depth; 0.008cm^2 area and  0.001cm^3 volume. There is Fat Layer (Subcutaneous Tissue) Exposed exposed. There is no tunneling or undermining noted. There is a none present amount of drainage noted. The wound margin is flat and intact. There is large (67-100%) red granulation within the wound bed. There is a small (1-33%) amount of necrotic tissue within the wound bed including Adherent Slough. Wound #37 status is Open. Original cause of wound was Gradually Appeared. The wound is located on the Left,Lateral Malleolus. The wound measures 2cm length x 0.3cm width x 0.1cm depth; 0.471cm^2 area and 0.047cm^3 volume. There is Fat Layer (Subcutaneous Tissue) Exposed exposed. There is no tunneling or undermining noted. There is a medium amount of serosanguineous drainage noted. The wound margin is flat and intact. There is large (67-100%) red granulation within the wound bed. There is a small (1-33%) amount of necrotic tissue within the wound bed. Wound #38 status is Open. Original cause of wound was Gradually Appeared. The wound is located on the Right T - Web between 1st and 2nd. The wound oe measures 0.7cm length x 0.2cm width x 0.4cm depth; 0.11cm^2 area and 0.044cm^3 volume. There is Fat Layer (Subcutaneous Tissue) Exposed exposed. There is no tunneling or undermining noted. There is a medium amount of serosanguineous drainage noted. The wound margin is distinct with the outline attached to the wound  base. There is large (67-100%) red granulation within the wound bed. There is a small (1-33%) amount of necrotic tissue within the wound bed including Adherent Slough. Wound #41 status is Open. Original cause of wound was Gradually Appeared. The wound is located on the Left Ischium. The wound measures 3.5cm length x 2cm width x 0.1cm depth; 5.498cm^2 area and 0.55cm^3 volume. There is Fat Layer (Subcutaneous Tissue) Exposed exposed. There is no tunneling or undermining noted. There is a medium amount of sanguinous drainage noted. The wound margin is distinct with the outline attached to the wound base. There is large (67-100%) pink, friable granulation within the wound bed. There is no necrotic tissue within the wound bed. 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 There was a Selective/Open Wound Skin/Epidermis Debridement with a total area of 0.25 sq cm performed by Ricard Dillon., MD. With the following instrument(s): Curette to remove Non-Viable tissue/material. Material removed includes Callus and Skin: Epidermis and. No specimens were taken. A time out was conducted at 08:42, prior to the start of the procedure. A Minimum amount of bleeding was controlled with Pressure. The procedure was tolerated well with a pain level of 0 throughout and a pain level of 0 following the procedure. Post Debridement Measurements: 0.1cm length x 0.1cm width x 0.1cm depth; 0.001cm^3 volume. Character of Wound/Ulcer Post  Debridement is improved. 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 There was a Selective/Open Wound Skin/Epidermis Debridement with a total area of 0.6 sq cm performed by Ricard Dillon., MD. With the following instrument(s): Curette to remove Non-Viable  tissue/material. Material removed includes Skin: Epidermis. No specimens were taken. A time out was conducted at 08:42, prior to the start of the procedure. A Minimum amount of bleeding was controlled with Pressure. The procedure was tolerated well with a pain level of 0 throughout and a pain level of 0 following the procedure. Post Debridement Measurements: 2cm length x 0.3cm width x 0.1cm depth; 0.047cm^3 volume. Character of Wound/Ulcer Post Debridement is improved. Severity of Tissue Post Debridement is: Fat layer exposed. 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 There was a Four Layer Compression Therapy Procedure by Levan Hurst, RN. Post procedure Diagnosis Wound #37: Same as Pre-Procedure Plan Follow-up Appointments: Return Appointment in 2 weeks. - Monday Nurse Visit: - 1 week for rewrap Dressing Change Frequency: Wound #24 Left,Dorsal Foot: Do not change entire dressing for one week. Wound #37 Left,Lateral Malleolus: Do not change entire dressing for one week. Wound #38 Right T - Web between 1st and 2nd: oe Change Dressing every other day. Wound #41 Left Ischium: Change Dressing every other day. Skin Barriers/Peri-Wound Care: Antifungal cream - on toes on both feet daily Barrier cream - to leg/ankle and left foot Moisturizing lotion - both legs TCA Cream or Ointment Primary Wound Dressing: Wound #24 Left,Dorsal  Foot: Iodoflex Wound #37 Left,Lateral Malleolus: Hydrofera Blue Wound #38 Right T - Web between 1st and 2nd: oe Calcium Alginate with Silver Wound #41 Left Ischium: Calcium Alginate with Silver Secondary Dressing: Wound #24 Left,Dorsal Foot: Dry Gauze ABD pad Wound #37 Left,Lateral Malleolus: Dry Gauze ABD pad Wound #38 Right T - Web between 1st and 2nd: oe Kerlix/Rolled Gauze - secure with tape Dry Gauze Wound #41 Left Ischium: Dry Gauze ABD pad Edema Control: 4 layer compression: Left lower extremity Elevate legs to the level of the heart or above for 30 minutes daily and/or when sitting, a frequency of: - throughout the day Support Garment 30-40 mm/Hg pressure to: - Juxtalite to right leg Off-Loading: Low air-loss mattress (Group 2) Roho cushion for wheelchair Turn and reposition every 2 hours - out of wheelchair throughout the day, try to lay on sides, sleep in the bed not the recliner 1. I did not change any of the current dressings which is Iodoflex the left dorsal foot. This really seems to change the course of this wound 2. Silver alginate to the left ischium 3. Hydrofera Blue to the left lateral malleolus 4. Silver alginate to continue between the first and second toes although I had trouble seeing anything open here. 5. I am not certain why were having so much difficulty with the area over his buttock. I suspect there is too much pressure and maceration here Electronic Signature(s) Signed: 04/28/2020 8:16:21 PM By: Linton Ham MD Entered By: Linton Ham on 04/28/2020 09:05:41 -------------------------------------------------------------------------------- SuperBill Details Patient Name: Date of Service: Farino, A [name] 04/28/2020 Medical Record Number: [id number] Patient Account Number: [id number] Date of Birth/Sex: Treating RN: August 05, 1986 (33 y.o. [name] Primary Care Provider: [place], [place] Other Clinician: Referring Provider: Treating  Provider/Extender: [name]: 225 Diagnosis Coding ICD-10 Codes Code Description I87.332 Chronic venous hypertension (idiopathic) with ulcer and inflammation of left lower extremity L97.321 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 to breakdown of skin L97.521 Non-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 L97.511 Non-pressure chronic ulcer of other part of right foot limited  to breakdown of 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, 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, complete Facility Procedures CPT4 Code: [id number] Description: [phone] - DEBRIDE WOUND 1ST 20 SQ CM OR < ICD-10 Diagnosis Description O73.085 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 to breakdown of skin L97.521 Non-pressure chronic ulcer of other part of left foot limited to breakdown of sk Modifier: in Quantity: 1 Physician Procedures : CPT4 Code Description Modifier [id number] - WC PHYS DEBR WO ANESTH 20 SQ CM ICD-10 Diagnosis Description L97.321 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 to breakdown of skin L97.521 Non-pressure chronic ulcer of other part of left foot  limited to breakdown of skin Quantity: 1 Electronic Signature(s) Signed: 04/28/2020 8:16:21 PM By: Linton Ham MD Entered By: Linton Ham on 04/28/2020 09:06:33

## 2020-04-29 NOTE — Progress Notes (Signed)
[name], LOC FEINSTEIN ([id number]) Visit Report for 04/28/2020 Arrival Information Details Patient Name: Date of Service: [name] [name], [name] [name] 04/28/2020 8:00 [name] [name] [name] Medical Record Number: [id number] Patient Account Number: [id number] Date of Birth/Sex: Treating RN: 11-Apr-1986 (34 y.o. [name] [name] [name]) [name] [name] [name] Primary Care [name] [name] [name]: [name], [name] Other Clinician: Referring [name] [name] [name]: Treating [name] [name] [name]/Extender: [name] [name] [name] in Treatment: 57 Visit Information History Since Last Visit All ordered tests and consults were completed: No Patient Arrived: Wheel Chair Added or deleted any medications: No Arrival Time: 08:09 Any new allergies or adverse reactions: No Accompanied By: self Had [name] fall or experienced change in No Transfer Assistance: None activities of daily living that may affect Patient Identification Verified: Yes risk of falls: Secondary Verification Process Completed: Yes Signs or symptoms of abuse/neglect since last visito No Patient Requires Transmission-Based Precautions: No Hospitalized since last visit: No Patient Has Alerts: Yes Implantable device outside of the clinic excluding No Patient Alerts: R ABI = 1.0 cellular tissue based products placed in the center L ABI = 1.1 since last visit: Has Dressing in Place as Prescribed: Yes Has Compression in Place as Prescribed: Yes Pain Present Now: No Electronic Signature(s) Signed: 04/29/2020 4:34:33 PM By: [name] Coria RN Entered By: [name] [name] [name] on 04/28/2020 08:10:11 -------------------------------------------------------------------------------- Compression Therapy Details Patient Name: Date of Service: [name] [name], [name] [name] 04/28/2020 8:00 [name] [name] [name] Medical Record Number: [id number] Patient Account Number: [id number] Date of Birth/Sex: Treating RN: 06-08-1986 (34 y.o. [name] [name] [name] Primary Care Ashleigh Luckow: [place], [name] Other Clinician: Referring [name] [name] [name]: Treating [name] [name] [name]/Extender: [name] [name] [name] in  Treatment: 225 Compression Therapy Performed for Wound Assessment: Wound #37 Left,Lateral Malleolus Performed By: Clinician [name] Hurst, RN Compression Type: Four Layer Post Procedure Diagnosis Same as Pre-procedure Electronic Signature(s) Signed: 04/28/2020 6:28:13 PM By: [name] Hurst RN, BSN Entered By: [name] [name] [name] on 04/28/2020 08:46:58 -------------------------------------------------------------------------------- Encounter Discharge Information Details Patient Name: Date of Service: [name] [name], [name] [name] 04/28/2020 8:00 [name] [name] [name] Medical Record Number: [id number] Patient Account Number: [id number] Date of Birth/Sex: Treating RN: 1986-03-20 (34 y.o. [name] [name] [name] Primary Care Kinga Cassar: [place], [place] Other Clinician: Referring [name] [name] [name]: Treating [name] [name] [name]/Extender: [name] [name] [name] in Treatment: 225 Encounter Discharge Information Items Post Procedure Vitals Discharge Condition: Stable Temperature (F): 98.4 Ambulatory Status: Wheelchair Pulse (bpm): 109 Discharge Destination: Home Respiratory Rate (breaths/min): 18 Transportation: Other Blood Pressure (mmHg): 115/76 Accompanied By: self Schedule Follow-up Appointment: Yes Clinical Summary of Care: Patient Declined Electronic Signature(s) Signed: 04/28/2020 5:35:09 PM By: [name] [name] [name] Entered By: [name] [name] [name] on 04/28/2020 08:57:55 -------------------------------------------------------------------------------- Lower Extremity Assessment Details Patient Name: Date of Service: [name] [name], [name] [name] 04/28/2020 8:00 [name] [name] [name] Medical Record Number: [id number] Patient Account Number: [id number] Date of Birth/Sex: Treating RN: November 16, 1986 (34 y.o.o. [name] [name] [name]) [name] [name] [name] Primary Care Masyn Rostro: [place], [place] Other Clinician: Referring Jamayia Croker: Treating [name] [name] [name]/Extender: [name] [name] [name] in Treatment: 225 Edema Assessment Assessed: [Left: No] [Right: No] Edema: [Left: No] [Right:  Yes] Calf Left: Right: Point of Measurement: 33 cm From Medial Instep 26 cm 32 cm Ankle Left: Right: Point of Measurement: 10 cm From Medial Instep 23 cm 26 cm Electronic Signature(s) Signed: 04/29/2020 4:34:33 PM By: [name] Coria RN Entered By: [name] [name] [name] on 04/28/2020 08:37:52 -------------------------------------------------------------------------------- Multi Wound Chart Details Patient Name: Date of Service: [name] [name], [name] [name] 04/28/2020 8:00 [name] [name] [name] Medical Record Number: [id number] Patient Account Number: [id number] Date of Birth/Sex: Treating RN: 02-19-1986 (34 y.o. [name] [name] [name] Primary Care [name] [name] [name]: [place], [place] Other Clinician: Referring [name] [name] [name]: Treating [name] [name] [name]/Extender: [name] [name] [name] [name] in Treatment: 225 Vital Signs Height(in): 70 Pulse(bpm): 109 Weight(lbs): 216 Blood Pressure(mmHg): 115/76 Body Mass Index(BMI): 31 Temperature(F): 98.4 Respiratory Rate(breaths/min): 18 Photos: [24:No Photos Left, Dorsal Foot] [37:No Photos Left, Lateral Malleolus] [38:No Photos Right T - Web between 1st and 2nd oe] Wound Location: [24:Gradually Appeared] [37:Gradually Appeared] [38:Gradually Appeared] Wounding Event: [24:Inflammatory] [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] [38:Inflammatory] Primary Etiology: [24:Sleep Apnea, Hypertension, Paraplegia Sleep Apnea, Hypertension, Paraplegia Sleep Apnea, Hypertension, Paraplegia] Comorbid History: [24:03/08/2018] [37:11/26/2019] [38:11/30/2019] Date Acquired: [24:111] [37:22] [38:21] [name] of Treatment: [24:Open] [37:Open] [38:Open] Wound Status: [24:Yes] [37:No] [38:No] Clustered Wound: [24:1] [37:[name]] [38:[name]] Clustered Quantity: [24:0.1x0.1x0.1] [37:2x0.3x0.1] [38:0.7x0.2x0.4] Measurements L x W x D (cm) [24:0.008] [37:0.471] [38:0.11] [name] (cm) : rea [24:0.001] [37:0.047] [38:0.044] Volume (cm) : [24:96.60%] [37:88.10%] [38:66.70%] % Reduction in [name] rea: [24:95.80%] [37:88.10%] [38:81.00%] % Reduction in Volume: [24:Full  Thickness Without Exposed] [37:Full Thickness Without Exposed] [38:Full Thickness Without Exposed] Classification: [24:Support Structures None Present] [37:Support Structures Medium] [38:Support Structures Medium] Exudate [name] mount: [24:[name]] [37:Serosanguineous] [38:Serosanguineous] Exudate Type: [24:[name]] [37:red, brown] [38:red, brown] Exudate Color: [24:Flat and Intact] [37:Flat and Intact] [38:Distinct, outline attached] Wound Margin: [24:Large (67-100%)] [37:Large (67-100%)] [38:Large (67-100%)] Granulation [name] mount: [24:Red] [37:Red] [38:Red] Granulation Quality: [24:Small (1-33%)] [37:Small (1-33%)] [38:Small (1-33%)] Necrotic [name] mount: [24:Fat Layer (Subcutaneous Tissue)] [37:Fat Layer (Subcutaneous Tissue)] [38:Fat Layer (Subcutaneous Tissue)] Exposed Structures: [24:Exposed: Yes Fascia: No Tendon: No Muscle: No Joint: No Bone: No Large (67-100%)] [37:Exposed: Yes Fascia: No Tendon: No Muscle: No Joint: No Bone: No Large (67-100%)] [38:Exposed: Yes Medium (34-66%)] Epithelialization: [24:Debridement - Selective/Open Wound Debridement - Selective/Open Wound [name]] Debridement: Pre-procedure Verification/Time Out 08:42 [37:08:42] [38:[name]] Taken: [24:Callus] [37:[name]] [38:[name]] Tissue Debrided: [24:Skin/Epidermis] [37:Skin/Epidermis] [38:[name]] Level: [24:0.25] [37:0.6] [38:[name]] Debridement [name] (sq cm): [24:rea Curette] [37:Curette] [38:[name]] Instrument: [24:Minimum] [37:Minimum] [38:[name]] Bleeding: [24:Pressure] [37:Pressure] [38:[name]] Hemostasis [name] chieved: [24:0] [37:0] [38:[name]] Procedural Pain: [24:0] [37:0] [38:[name]] Post Procedural Pain: [24:Procedure was tolerated well] [37:Procedure was tolerated well] [38:[name]] Debridement Treatment Response: [24:0.1x0.1x0.1] [37:2x0.3x0.1] [38:[name]] Post Debridement Measurements L x W x D (cm) [24:0.001] [37:0.047] [38:[name]] Post Debridement Volume: (cm) [24:Debridement] [37:Compression Therapy] [38:[name]] Procedures Performed: [24:41] [37:Debridement [name]]  [38:[name]] Photos: [24:No Photos Left Ischium] [37:[name] [name]] [38:[name] [name]] Wound Location: [24:Gradually Appeared] [37:[name]] [38:[name]] Wounding Event: [24:Pressure Ulcer] [37:[name]] [38:[name]] Primary Etiology: [24:Sleep Apnea, Hypertension, Paraplegia [name]] [38:[name]] Comorbid History: [24:03/16/2020] [37:[name]] [38:[name]] Date Acquired: [24:6] [37:[name]] [38:[name]] [name] of Treatment: [24:Open] [37:[name]] [38:[name]] Wound Status: [24:No] [37:[name]] [38:[name]] Clustered Wound: [24:[name]] [37:[name]] [38:[name]] Clustered Quantity: [24:3.5x2x0.1] [37:[name]] [38:[name]] Measurements L x W x D (cm) [24:5.498] [37:[name]] [38:[name]] [name] (cm) : rea [24:0.55] [37:[name]] [38:[name]] Volume (cm) : [24:-204.40%] [37:[name]] [38:[name]] % Reduction in [name] rea: [24:-203.90%] [37:[name]] [38:[name]] % Reduction in Volume: [24:Category/Stage II] [37:[name] [name]] Classification: [24:Medium] [37:[name] [name]] Exudate [name] mount: [24:Sanguinous] [37:[name] [name]] Exudate Type: [24:red] [37:[name] [name]] Exudate Color: [24:Distinct, outline attached] [37:[name] [name]] Wound Margin: [24:Large (67-100%)] [37:[name] [name]] Granulation [name] mount: [24:Pink, Friable] [37:[name] [name]] Granulation Quality: [24:None Present (0%)] [37:[name] [name]] Necrotic [name] mount: [24:Fat Layer (Subcutaneous Tissue)] [37:[name] [name]] Exposed Structures: [24:Exposed: Yes Fascia: No Tendon: No Muscle: No Joint: No Bone: No Medium (34-66%)] [37:[name] [name]] Epithelialization: [24:[name]] [37:[name] [name]] Debridement: [24:[name]] [37:[name] [name]] Tissue Debrided: [24:[name]] [37:[name] [name]] Level: [24:[name]] [37:[name] [name]] Debridement [name] (sq cm): [24:rea [name]] [37:[name] [name]] Instrument: [24:[name]] [37:[name] [name]] Bleeding: [24:[name]] [37:[name] [name]] Hemostasis [name] chieved: [24:[name]] [37:[name] [name]] Procedural Pain: [24:[name]] [37:[name] [name]] Post Procedural Pain: Debridement Treatment Response: [name] [37:[name] [name]] Post Debridement Measurements L x [name] [37:[name] [name]] W x D (cm) [24:[name]] [37:[name] [name]] Post  Debridement Volume: (cm) [24:[name]] [37:[name] [name]] Treatment Notes Wound #24 (Left,  Dorsal Foot) 1. Cleanse With Wound Cleanser Soap and water 2. Periwound Care Antifungal cream Barrier cream Moisturizing lotion TCA Cream 3. Primary Dressing Applied Hydrofera Blue Iodoflex 4. Secondary Dressing ABD Pad Dry Gauze 6. Support Layer Applied 4 layer compression wrap Notes hydrofera to malleolus and idoflex to foot Wound #38 (Right Toe - Web between 1st and 2nd) 1. Cleanse With Wound Cleanser 2. Periwound Care Antifungal cream 3. Primary Dressing Applied Calcium Alginate Ag 4. Secondary Dressing Dry Gauze Roll Gauze 5. Secured With Tape Notes antifungal cream to toes 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 Cleanse With Wound Cleanser 2. Periwound Care Skin Prep 3. Primary Dressing Applied Calcium Alginate Ag 4. Secondary Dressing ABD Pad Dry Gauze 5. Secured With Recruitment consultant) Signed: 04/28/2020 6:28:13 PM By: [name] Hurst RN, BSN Signed: 04/28/2020 8:16:21 PM By: Linton Ham MD Entered By: Linton Ham on 04/28/2020 08:56:34 -------------------------------------------------------------------------------- Multi-Disciplinary Care Plan Details Patient Name: Date of Service: [name] [name], [name] [name] 04/28/2020 8:00 [name] [name] [name] Medical Record Number: [id number] Patient Account Number: [id number] Date of Birth/Sex: Treating RN: 1986-06-01 (34 y.o. [name] [name] [name] Primary Care Sharena Dibenedetto: [name], [name] Other Clinician: Referring Terena Bohan: Treating Lovena Kluck/Extender: [name] [name] [name] in Treatment: 225 Active Inactive Wound/Skin Impairment Nursing Diagnoses: Impaired tissue integrity Knowledge deficit related to ulceration/compromised skin integrity Goals: Patient/caregiver will verbalize understanding of skin care regimen Date Initiated: 01/05/2016 Target Resolution Date: 05/16/2020 Goal Status: Active Ulcer/skin breakdown will have [name] volume reduction of 30% by week 4 Date Initiated: 01/05/2016 Date Inactivated: 12/22/2017 Target  Resolution Date: 01/19/2018 Unmet Reason: complex wounds, Goal Status: Unmet infection Interventions: Assess patient/caregiver ability to obtain necessary supplies Assess ulceration(s) every visit Provide education on ulcer and skin care Notes: Electronic Signature(s) Signed: 04/28/2020 6:28:13 PM By: [name] Hurst RN, BSN Entered By: [name] [name] [name] on 04/28/2020 08:42:40 -------------------------------------------------------------------------------- Pain Assessment Details Patient Name: Date of Service: [name] [name], [name] [name] 04/28/2020 8:00 [name] [name] [name] Medical Record Number: [id number] Patient Account Number: [id number] Date of Birth/Sex: Treating RN: 27-Apr-1986 (34 y.o. [name] Primary Care Aunika Kirsten: [place], [place] Other Clinician: Referring Fedor Kazmierski: Treating Joseangel Nettleton/Extender: [name] [name] [name] in Treatment: 225 Active Problems Location of Pain Severity and Description of Pain Patient Has Paino No Site Locations Pain Management and Medication Current Pain Management: Electronic Signature(s) Signed: 04/29/2020 4:34:33 PM By: [name] Coria RN Entered By: [name] [name] [name] on 04/28/2020 08:10:51 -------------------------------------------------------------------------------- Patient/Caregiver Education Details Patient Name: Date of Service: Isaacks, [name] [name] 6/7/2021andnbsp8:00 [name] [name] [name] Medical Record Number: [id number] Patient Account Number: [id number] Date of Birth/Gender: Treating RN: [birth year] (34 y.o. [name] [name] [name] Primary Care Physician: Janine Limbo Other Clinician: Referring Physician: Treating Physician/Extender: [name] [name] [name] in Treatment: 1 Education Assessment Education Provided To: Patient Education Topics Provided Wound/Skin Impairment: Methods: Explain/Verbal Responses: State content correctly Electronic Signature(s) Signed: 04/28/2020 6:28:13 PM By: [name] Hurst RN, BSN Entered By: [name] [name] [name] on 04/28/2020  08:43:00 -------------------------------------------------------------------------------- Wound Assessment Details Patient Name: Date of Service: [name] [name], [name] [name] 04/28/2020 8:00 [name] [name] [name] Medical Record Number: [id number] Patient Account Number: [id number] Date of Birth/Sex: Treating RN: 1985-12-19 (34 y.o. [name] Primary Care Lashaunda Schild: [place], [place] Other Clinician: Referring Carly Applegate: Treating Jaria [name]/Extender: [name] [name] [name] in Treatment: 225 Wound Status Wound Number: 24 Primary Etiology: Inflammatory Wound Location: Left, Dorsal Foot Wound Status: Open Wounding Event: Gradually Appeared Comorbid History: Sleep Apnea, Hypertension, Paraplegia Date Acquired: 03/08/2018 [name] Of Treatment: 111 Clustered Wound: Yes  Photos Photo Uploaded By: Mikeal Hawthorne on 04/29/2020 10:27:20 Wound Measurements Length: (cm) 0.1 Width: (cm) 0.1 Depth: (cm) 0.1 Clustered Quantity: 1 Area: (cm) 0. Volume: (cm) 0. % Reduction in Area: 96.6% % Reduction in Volume: 95.8% Epithelialization: Large (67-100%) Tunneling: No 008 Undermining: No 001 Wound Description Classification: Full Thickness Without Exposed Support Stru Wound Margin: Flat and Intact Exudate Amount: None Present ctures Foul Odor After Cleansing: No Slough/Fibrino No Wound Bed Granulation Amount: Large (67-100%) Exposed Structure Granulation Quality: Red Fascia Exposed: No Necrotic Amount: Small (1-33%) Fat Layer (Subcutaneous Tissue) Exposed: Yes Necrotic Quality: Adherent Slough Tendon Exposed: No Muscle Exposed: No Joint Exposed: No Bone Exposed: No Treatment Notes Wound #24 (Left, Dorsal Foot) 1. Cleanse With Wound Cleanser Soap and water 2. Periwound Care Antifungal cream Barrier cream Moisturizing lotion TCA Cream 3. Primary Dressing Applied Hydrofera Blue Iodoflex 4. Secondary Dressing ABD Pad Dry Gauze 6. Support Layer Applied 4 layer compression wrap Notes hydrofera  to malleolus and idoflex to foot Electronic Signature(s) Signed: 04/29/2020 4:34:33 PM By: [name] Coria RN Entered By: [name] [name] [name] on 04/28/2020 08:35:31 -------------------------------------------------------------------------------- Wound Assessment Details Patient Name: Date of Service: [name] [name], [name] [name] 04/28/2020 8:00 [name] [name] [name] Medical Record Number: [id number] Patient Account Number: [id number] Date of Birth/Sex: Treating RN: [birth year] (34 y.o. [name] [name] [name]) [name] [name] [name] Primary Care Alphia Behanna: [name], [name] Other Clinician: Referring Youlanda Tomassetti: Treating Montague Corella/Extender: [name] [name] [name] in Treatment: 225 Wound Status Wound Number: 37 Primary Etiology: Venous Leg Ulcer Wound Location: Left, Lateral Malleolus Wound Status: Open Wounding Event: Gradually Appeared Comorbid History: Sleep Apnea, Hypertension, Paraplegia Date Acquired: 11/26/2019 [name] Of Treatment: 22 Clustered Wound: No Photos Photo Uploaded By: Mikeal Hawthorne on 04/29/2020 10:27:21 Wound Measurements Length: (cm) 2 Width: (cm) 0.3 Depth: (cm) 0.1 Area: (cm) 0.471 Volume: (cm) 0.047 % Reduction in Area: 88.1% % Reduction in Volume: 88.1% Epithelialization: Large (67-100%) Tunneling: No Undermining: No Wound Description Classification: Full Thickness Without Exposed Support Structures Wound Margin: Flat and Intact Exudate Amount: Medium Exudate Type: Serosanguineous Exudate Color: red, brown Foul Odor After Cleansing: No Slough/Fibrino Yes Wound Bed Granulation Amount: Large (67-100%) Exposed Structure Granulation Quality: Red Fascia Exposed: No Necrotic Amount: Small (1-33%) Fat Layer (Subcutaneous Tissue) Exposed: Yes Tendon Exposed: No Muscle Exposed: No Joint Exposed: No Bone Exposed: No Treatment Notes Wound #37 (Left, Lateral Malleolus) 1. Cleanse With Wound Cleanser Soap and water 2. Periwound Care Antifungal cream Barrier cream Moisturizing lotion TCA Cream 3. Primary Dressing  Applied Hydrofera Blue 4. Secondary Dressing ABD Pad Dry Gauze 6. Support Layer Applied 4 layer compression wrap Notes hydrofera to malleolus and idoflex to foot Electronic Signature(s) Signed: 04/29/2020 4:34:33 PM By: [name] Coria RN Entered By: [name] [name] [name] on 04/28/2020 08:36:23 -------------------------------------------------------------------------------- Wound Assessment Details Patient Name: Date of Service: [name] [name], [name] [name] 04/28/2020 8:00 [name] [name] [name] Medical Record Number: [id number] Patient Account Number: [id number] Date of Birth/Sex: Treating RN: 1986-04-22 (34 y.o. [name] [name] [name]) [name] [name] [name] Primary Care Queenie Aufiero: [place], [place] Other Clinician: Referring Tyrene Nader: Treating Theresea Trautmann/Extender: [name] [name] [name], [name] in Treatment: 225 Wound Status Wound Number: 38 Primary Etiology: Inflammatory Wound Location: Right T - Web between 1st and 2nd oe Wound Status: Open Wounding Event: Gradually Appeared Comorbid History: Sleep Apnea, Hypertension, Paraplegia Date Acquired: 11/30/2019 [name] Of Treatment: 21 Clustered Wound: No Photos Photo Uploaded By: Mikeal Hawthorne on 04/29/2020 10:26:47 Wound Measurements Length: (cm) 0.7 Width: (cm) 0.2 Depth: (cm) 0.4 Area: (cm) 0.11 Volume: (cm) 0.044 % Reduction in Area: 66.7% % Reduction in Volume: 81% Epithelialization: Medium (34-66%)  Tunneling: No Undermining: No Wound Description Classification: Full Thickness Without Exposed Support Structures Wound Margin: Distinct, outline attached Exudate Amount: Medium Exudate Type: Serosanguineous Exudate Color: red, brown Foul Odor After Cleansing: No Slough/Fibrino Yes Wound Bed Granulation Amount: Large (67-100%) Exposed Structure Granulation Quality: Red Fat Layer (Subcutaneous Tissue) Exposed: Yes Necrotic Amount: Small (1-33%) Necrotic Quality: Adherent Slough Treatment Notes Wound #38 (Right Toe - Web between 1st and 2nd) 1. Cleanse With Wound Cleanser 2. Periwound  Care Antifungal cream 3. Primary Dressing Applied Calcium Alginate Ag 4. Secondary Dressing Dry Gauze Roll Gauze 5. Secured With Tape Notes antifungal cream to toes Electronic Signature(s) Signed: 04/29/2020 4:34:33 PM By: [name] Coria RN Entered By: [name] [name] [name] on 04/28/2020 08:36:48 -------------------------------------------------------------------------------- Wound Assessment Details Patient Name: Date of Service: [name] [name], [name] [name] 04/28/2020 8:00 [name] [name] [name] Medical Record Number: [id number] Patient Account Number: [id number] Date of Birth/Sex: Treating RN: 10/10/1986 (34 y.o. [name] [name] [name]) [name] [name] [name] Primary Care Emyah Roznowski: [name], [name] Other Clinician: Referring Shereena Berquist: Treating Swan Zayed/Extender: [name] [name] [name], [name] in Treatment: 225 Wound Status Wound Number: 41 Primary Etiology: Pressure Ulcer Wound Location: Left Ischium Wound Status: Open Wounding Event: Gradually Appeared Comorbid History: Sleep Apnea, Hypertension, Paraplegia Date Acquired: 03/16/2020 [name] Of Treatment: 6 Clustered Wound: No Photos Photo Uploaded By: Mikeal Hawthorne on 04/29/2020 10:27:47 Wound Measurements Length: (cm) 3.5 Width: (cm) 2 Depth: (cm) 0.1 Area: (cm) 5.498 Volume: (cm) 0.55 % Reduction in Area: -204.4% % Reduction in Volume: -203.9% Epithelialization: Medium (34-66%) Tunneling: No Undermining: No Wound Description Classification: Category/Stage II Wound Margin: Distinct, outline attached Exudate Amount: Medium Exudate Type: Sanguinous Exudate Color: red Foul Odor After Cleansing: No Slough/Fibrino No Wound Bed Granulation Amount: Large (67-100%) Exposed Structure Granulation Quality: Pink, Friable Fascia Exposed: No Necrotic Amount: None Present (0%) Fat Layer (Subcutaneous Tissue) Exposed: Yes Tendon Exposed: No Muscle Exposed: No Joint Exposed: No Bone Exposed: No Treatment Notes 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 Cleanse With Wound Cleanser 2. Periwound  Care Skin Prep 3. Primary Dressing Applied Calcium Alginate Ag 4. Secondary Dressing ABD Pad Dry Gauze 5. Secured With Recruitment consultant) Signed: 04/29/2020 4:34:33 PM By: [name] Coria RN Entered By: [name] [name] [name] on 04/28/2020 08:37:17 -------------------------------------------------------------------------------- [place] Details Patient Name: Date of Service: [name] [name], [name] [name] 04/28/2020 8:00 [name] [name] [name] Medical Record Number: [id number] Patient Account Number: [id number] Date of Birth/Sex: Treating RN: [birth year] (33 y.o. [name] [name] [name]) [name] [name] [name] Primary Care Shyan Scalisi: [place], [place] Other Clinician: Referring Lochlin Eppinger: Treating Floye Fesler/Extender: [name] [name] [name], [name] in Treatment: 225 Vital Signs Time Taken: 08:10 Temperature (F): 98.4 Height (in): 70 Pulse (bpm): 109 Weight (lbs): 216 Respiratory Rate (breaths/min): 18 Body Mass Index (BMI): 31 Blood Pressure (mmHg): 115/76 Reference Range: 80 - 120 mg / dl Electronic Signature(s) Signed: 04/29/2020 4:34:33 PM By: [name] Coria RN Entered By: [name] [name] [name] on 04/28/2020 08:10:42

## 2020-05-05 ENCOUNTER — Encounter (HOSPITAL_BASED_OUTPATIENT_CLINIC_OR_DEPARTMENT_OTHER): Payer: BC Managed Care – PPO | Admitting: Physician Assistant

## 2020-05-05 ENCOUNTER — Encounter (HOSPITAL_BASED_OUTPATIENT_CLINIC_OR_DEPARTMENT_OTHER): Payer: BC Managed Care – PPO | Admitting: Internal Medicine

## 2020-05-05 ENCOUNTER — Other Ambulatory Visit: Payer: Self-pay

## 2020-05-05 DIAGNOSIS — I890 Lymphedema, not elsewhere classified: Secondary | ICD-10-CM | POA: Diagnosis not present

## 2020-05-05 DIAGNOSIS — I872 Venous insufficiency (chronic) (peripheral): Secondary | ICD-10-CM | POA: Diagnosis not present

## 2020-05-05 DIAGNOSIS — Z8614 Personal history of Methicillin resistant Staphylococcus aureus infection: Secondary | ICD-10-CM | POA: Diagnosis not present

## 2020-05-05 DIAGNOSIS — L97521 Non-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: Secondary | ICD-10-CM | POA: Diagnosis not present

## 2020-05-05 DIAGNOSIS — Z87440 Personal history of urinary (tract) infections: Secondary | ICD-10-CM | POA: Diagnosis not present

## 2020-05-05 DIAGNOSIS — I10 Essential (primary) hypertension: Secondary | ICD-10-CM | POA: Diagnosis not present

## 2020-05-05 DIAGNOSIS — L97511 Non-pressure chronic ulcer of other part of right foot limited to breakdown of skin: Secondary | ICD-10-CM | POA: Diagnosis not present

## 2020-05-05 DIAGNOSIS — L03115 Cellulitis of right lower limb: Secondary | ICD-10-CM | POA: Diagnosis not present

## 2020-05-05 DIAGNOSIS — Z9081 Acquired absence of spleen: Secondary | ICD-10-CM | POA: Diagnosis not present

## 2020-05-05 DIAGNOSIS — L97321 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 to breakdown of skin: Secondary | ICD-10-CM | POA: Diagnosis not present

## 2020-05-05 DIAGNOSIS — G8221 Paraplegia, complete: Secondary | ICD-10-CM | POA: Diagnosis not present

## 2020-05-05 DIAGNOSIS — L89623 Pressure ulcer of left heel, stage 3: Secondary | ICD-10-CM | POA: Diagnosis not present

## 2020-05-05 DIAGNOSIS — L89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, stage 3: Secondary | ICD-10-CM | POA: Diagnosis not present

## 2020-05-06 ENCOUNTER — Encounter (HOSPITAL_BASED_OUTPATIENT_CLINIC_OR_DEPARTMENT_OTHER): Payer: BC Managed Care – PPO | Admitting: Internal Medicine

## 2020-05-07 NOTE — Progress Notes (Signed)
Bonus, [name] ([id number]) Visit Report for 05/05/2020 SuperBill Details Patient Name: Date of Service: [name], [name] 05/05/2020 Medical Record Number: [id number] Patient Account Number: [id number] Date of Birth/Sex: Treating RN: 1986/02/15 (34 y.o. 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

## 2020-05-07 NOTE — Progress Notes (Signed)
[name], [name] MARINELLO ([id number]) Visit Report for 05/05/2020 Arrival Information Details Patient Name: Date of Service: [name], [name] 05/05/2020 8:30 [name] Medical Record Number: [id number] Patient Account Number: [id number] Date of Birth/Sex: Treating RN: [birth year] (34 y.o. [name]) Carlene Coria Primary Care Ignazio Kincaid: [name], [name] Other Clinician: Referring Jaquaya Coyle: Treating Earnie Bechard/Extender: Prudencio Burly, [name]: 32 Visit Information History Since Last Visit All ordered tests and consults were completed: No Patient Arrived: Wheel Chair Added or deleted any medications: No Arrival Time: 09:21 Any new allergies or adverse reactions: No Accompanied By: self Had [name] fall or experienced change in No Transfer Assistance: None activities of daily living that may affect Patient Identification Verified: Yes risk of falls: Secondary Verification Process Completed: Yes Signs or symptoms of abuse/neglect since last visito No Patient Requires Transmission-Based Precautions: No Hospitalized since last visit: No Patient Has Alerts: Yes Implantable device outside of the clinic excluding No Patient Alerts: R ABI = 1.0 cellular tissue based products placed in the center L ABI = 1.1 since last visit: Has Dressing in Place as Prescribed: Yes Has Compression in Place as Prescribed: Yes Pain Present Now: No Electronic Signature(s) Signed: 05/07/2020 9:54:21 AM By: Carlene Coria RN Entered By: Carlene Coria on 05/05/2020 09:21:38 -------------------------------------------------------------------------------- Compression Therapy Details Patient Name: Date of Service: [name], [name] 05/05/2020 8:30 [name] Medical Record Number: [id number] Patient Account Number: [id number] Date of Birth/Sex: Treating RN: Nov 29, 1985 (34 y.o. [name] Primary Care Shanea Karney: [place], [place] Other Clinician: Referring Taijah Macrae: Treating Antoria Lanza/Extender: Prudencio Burly, [name] Weeks in  Treatment: 226 Compression Therapy Performed for Wound Assessment: Wound #24 Left,Dorsal Foot Performed By: Clinician Carlene Coria, RN Compression Type: Four Layer Electronic Signature(s) Signed: 05/07/2020 9:54:21 AM By: Carlene Coria RN Entered By: Carlene Coria on 05/05/2020 09:26:04 -------------------------------------------------------------------------------- Compression Therapy Details Patient Name: Date of Service: [name], [name] 05/05/2020 8:30 [name] Medical Record Number: [id number] Patient Account Number: [id number] Date of Birth/Sex: Treating RN: 1986/04/09 (34 y.o. [name] Primary Care Talayia Hjort: [place], [place] Other Clinician: Referring Mysty Kielty: Treating Sabrena Gavitt/Extender: Prudencio Burly, [name]: 226 Compression Therapy Performed for Wound Assessment: Wound #37 Left,Lateral Malleolus Performed By: Jake Church, RN Compression Type: Four Layer Electronic Signature(s) Signed: 05/07/2020 9:54:21 AM By: Carlene Coria RN Entered By: Carlene Coria on 05/05/2020 09:26:04 -------------------------------------------------------------------------------- Encounter Discharge Information Details Patient Name: Date of Service: [name], [name] 05/05/2020 8:30 [name] Medical Record Number: [id number] Patient Account Number: [id number] Date of Birth/Sex: Treating RN: 1986-02-23 (34 y.o. [name] Primary Care Stevenson Windmiller: [place], [place] Other Clinician: Referring Hadlie Gipson: Treating Christophr Calix/Extender: Prudencio Burly, [name]: 226 Encounter Discharge Information Items Discharge Condition: Stable Ambulatory Status: Wheelchair Discharge Destination: Home Transportation: Private Auto Accompanied By: self Schedule Follow-up Appointment: Yes Clinical Summary of Care: Patient Declined Electronic Signature(s) Signed: 05/07/2020 9:54:21 AM By: Carlene Coria RN Entered By: Carlene Coria on 05/05/2020  09:29:54 -------------------------------------------------------------------------------- Patient/Caregiver Education Details Patient Name: Date of Service: [name], [name] [name] 6/14/2021andnbsp8:30 [place] Record Number: [id number] Patient Account Number: [id number] Date of Birth/Gender: Treating RN: May 20, 1986 (33 y.o. [name] Primary Care Physician: Janine Limbo Other Clinician: Referring Physician: Treating Physician/Extender: Vita Barley [name]: 58 Education Assessment Education Provided To: Patient Education Topics Provided Wound/Skin Impairment: Methods: Explain/Verbal Responses: State content correctly Electronic Signature(s) Signed: 05/07/2020 9:54:21 AM By: Carlene Coria RN Entered By: Carlene Coria on 05/05/2020 09:29:35 -------------------------------------------------------------------------------- Wound Assessment  Details Patient Name: Date of Service: [name], [name] 05/05/2020 8:30 [name] Medical Record Number: [id number] Patient Account Number: [id number] Date of Birth/Sex: Treating RN: November 28, 1985 (34 y.o. [name]) Carlene Coria Primary Care Bree Heinzelman: [name], [name] Other Clinician: Referring Wayde Gopaul: Treating Freddy Kinne/Extender: Worthy Keeler [name], [name]: 226 Wound Status Wound Number: 24 Primary Etiology: Inflammatory Wound Location: Left, Dorsal Foot Wound Status: Open Wounding Event: Gradually Appeared Comorbid History: Sleep Apnea, Hypertension, Paraplegia Date Acquired: 03/08/2018 Weeks Of Treatment: 112 Clustered Wound: Yes Wound Measurements Length: (cm) 0.1 Width: (cm) 0.1 Depth: (cm) 0.1 Clustered Quantity: 1 Area: (cm) 0.0 Volume: (cm) 0.0 % Reduction in Area: 96.6% % Reduction in Volume: 95.8% Epithelialization: Large (67-100%) Tunneling: No 08 Undermining: No 01 Wound Description Classification: Full Thickness Without Exposed Support Structures Wound Margin: Flat and  Intact Exudate Amount: None Present Foul Odor After Cleansing: No Slough/Fibrino Yes Wound Bed Granulation Amount: None Present (0%) Exposed Structure Necrotic Amount: Large (67-100%) Fascia Exposed: No Necrotic Quality: Eschar Fat Layer (Subcutaneous Tissue) Exposed: No Tendon Exposed: No Muscle Exposed: No Joint Exposed: No Bone Exposed: No Treatment Notes Wound #24 (Left, Dorsal Foot) 1. Cleanse With Wound Cleanser Soap and water 3. Primary Dressing Applied Iodoflex 4. Secondary Dressing Dry Gauze 6. Support Layer Applied 4 layer compression wrap Notes hydrofera to malleolus and idoflex to foot Electronic Signature(s) Signed: 05/07/2020 9:54:21 AM By: Carlene Coria RN Entered By: Carlene Coria on 05/05/2020 09:23:56 -------------------------------------------------------------------------------- Wound Assessment Details Patient Name: Date of Service: Berr[place], [name] 05/05/2020 8:30 [name] Medical Record Number: [id number] Patient Account Number: [id number] Date of Birth/Sex: Treating RN: 25-Sep-1986 (34 y.o. [name]) Carlene Coria Primary Care Zabdiel Dripps: [name], [name] Other Clinician: Referring Folasade Mooty: Treating Tarvares Lant/Extender: Worthy Keeler [name], [name]: 226 Wound Status Wound Number: 37 Primary Etiology: Venous Leg Ulcer Wound Location: Left, Lateral Malleolus Wound Status: Open Wounding Event: Gradually Appeared Comorbid History: Sleep Apnea, Hypertension, Paraplegia Date Acquired: 11/26/2019 Weeks Of Treatment: 23 Clustered Wound: No Wound Measurements Length: (cm) 2 Width: (cm) 0.3 Depth: (cm) 0.1 Area: (cm) 0.471 Volume: (cm) 0.047 % Reduction in Area: 88.1% % Reduction in Volume: 88.1% Epithelialization: Large (67-100%) Tunneling: No Undermining: No Wound Description Classification: Full Thickness Without Exposed Support Structures Wound Margin: Flat and Intact Exudate Amount: Medium Exudate Type: Serosanguineous Exudate Color:  red, brown Foul Odor After Cleansing: No Slough/Fibrino Yes Wound Bed Granulation Amount: Large (67-100%) Exposed Structure Granulation Quality: Red Fascia Exposed: No Necrotic Amount: Small (1-33%) Fat Layer (Subcutaneous Tissue) Exposed: Yes Tendon Exposed: No Muscle Exposed: No Joint Exposed: No Bone Exposed: No Treatment Notes Wound #37 (Left, Lateral Malleolus) 1. Cleanse With Wound Cleanser Soap and water 3. Primary Dressing Applied Hydrofera Blue 4. Secondary Dressing Dry Gauze 6. Support Layer Applied 4 layer compression wrap Electronic Signature(s) Signed: 05/07/2020 9:54:21 AM By: Carlene Coria RN Entered By: Carlene Coria on 05/05/2020 78:93:81 -------------------------------------------------------------------------------- Wound Assessment Details Patient Name: Date of Service: [place], [name] 05/05/2020 8:30 [name] Medical Record Number: [id number] Patient Account Number: [id number] Date of Birth/Sex: Treating RN: 10-13-1986 (34 y.o. [name]) Carlene Coria Primary Care Monik Lins: [name], [name] Other Clinician: Referring Rakin Lemelle: Treating Duey Liller/Extender: Worthy Keeler [name], [name]: 226 Wound Status Wound Number: 38 Primary Etiology: Inflammatory Wound Location: Right T - Web between 1st and 2nd oe Wound Status: Open Wounding Event: Gradually Appeared Comorbid History: Sleep Apnea, Hypertension, Paraplegia Date Acquired: 11/30/2019 Weeks Of Treatment: 22 Clustered Wound: No Wound Measurements Length: (cm) 0.7 Width: (  cm) 0.2 Depth: (cm) 0.4 Area: (cm) 0.11 Volume: (cm) 0.044 % Reduction in Area: 66.7% % Reduction in Volume: 81% Epithelialization: Medium (34-66%) Tunneling: No Undermining: No Wound Description Classification: Full Thickness Without Exposed Support Structures Wound Margin: Distinct, outline attached Exudate Amount: Medium Exudate Type: Serosanguineous Exudate Color: red, brown Foul Odor After Cleansing:  No Slough/Fibrino Yes Wound Bed Granulation Amount: Large (67-100%) Exposed Structure Granulation Quality: Red Fat Layer (Subcutaneous Tissue) Exposed: Yes Necrotic Amount: Small (1-33%) Necrotic Quality: Adherent Slough Treatment Notes Wound #38 (Right Toe - Web between 1st and 2nd) 1. Cleanse With Wound Cleanser 3. Primary Dressing Applied Calcium Alginate Ag 4. Secondary Dressing Dry Gauze Roll Gauze 5. Secured With Tape Notes antifungal cream to toes Electronic Signature(s) Signed: 05/07/2020 9:54:21 AM By: Carlene Coria RN Entered By: Carlene Coria on 05/05/2020 09:24:55 -------------------------------------------------------------------------------- Wound Assessment Details Patient Name: Date of Service: Moustafa, [name] 05/05/2020 8:30 [name] Medical Record Number: [id number] Patient Account Number: [id number] Date of Birth/Sex: Treating RN: 1986-10-17 (34 y.o. [name]) Carlene Coria Primary Care Shekita Boyden: [name], [name] Other Clinician: Referring Toshie Demelo: Treating Annmarie Plemmons/Extender: Worthy Keeler [name], [name]: 226 Wound Status Wound Number: 41 Primary Etiology: Pressure Ulcer Wound Location: Left Ischium Wound Status: Open Wounding Event: Gradually Appeared Comorbid History: Sleep Apnea, Hypertension, Paraplegia Date Acquired: 03/16/2020 Weeks Of Treatment: 7 Clustered Wound: No Wound Measurements Length: (cm) 3.5 Width: (cm) 2 Depth: (cm) 0.1 Area: (cm) 5.498 Volume: (cm) 0.55 % Reduction in Area: -204.4% % Reduction in Volume: -203.9% Epithelialization: Medium (34-66%) Tunneling: No Undermining: No Wound Description Classification: Category/Stage II Wound Margin: Distinct, outline attached Exudate Amount: Medium Exudate Type: Sanguinous Exudate Color: red Foul Odor After Cleansing: No Slough/Fibrino No Wound Bed Granulation Amount: Large (67-100%) Exposed Structure Granulation Quality: Pink, Friable Fascia Exposed: No Necrotic  Amount: None Present (0%) Fat Layer (Subcutaneous Tissue) Exposed: Yes Tendon Exposed: No Muscle Exposed: No Joint Exposed: No Bone Exposed: No Treatment Notes 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 Cleanse With Wound Cleanser 3. Primary Dressing Applied Calcium Alginate Ag 4. Secondary Dressing Foam Border Dressing Electronic Signature(s) Signed: 05/07/2020 9:54:21 AM By: Carlene Coria RN Entered By: Carlene Coria on 05/05/2020 09:25:26 -------------------------------------------------------------------------------- [place] Details Patient Name: Date of Service: [place], [name] 05/05/2020 8:30 [name] Medical Record Number: [id number] Patient Account Number: [id number] Date of Birth/Sex: Treating RN: October 02, 1986 (34 y.o. [name]) Carlene Coria Primary Care Nanie Dunkleberger: [place], [place] Other Clinician: Referring Jazlyn Tippens: Treating Roper Tolson/Extender: Worthy Keeler [name], [name]: 226 Vital Signs Time Taken: 09:10 Temperature (F): 98.8 Height (in): 70 Pulse (bpm): 120 Weight (lbs): 216 Respiratory Rate (breaths/min): 18 Body Mass Index (BMI): 31 Blood Pressure (mmHg): 118/76 Reference Range: 80 - 120 mg / dl Electronic Signature(s) Signed: 05/07/2020 9:54:21 AM By: Carlene Coria RN Entered By: Carlene Coria on 05/05/2020 09:22:06

## 2020-05-12 ENCOUNTER — Encounter (HOSPITAL_BASED_OUTPATIENT_CLINIC_OR_DEPARTMENT_OTHER): Payer: BC Managed Care – PPO | Admitting: Internal Medicine

## 2020-05-12 DIAGNOSIS — L89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, stage 3: Secondary | ICD-10-CM | POA: Diagnosis not present

## 2020-05-12 DIAGNOSIS — Z9081 Acquired absence of spleen: Secondary | ICD-10-CM | POA: Diagnosis not present

## 2020-05-12 DIAGNOSIS — I10 Essential (primary) hypertension: Secondary | ICD-10-CM | POA: Diagnosis not present

## 2020-05-12 DIAGNOSIS — Z87440 Personal history of urinary (tract) infections: Secondary | ICD-10-CM | POA: Diagnosis not present

## 2020-05-12 DIAGNOSIS — G8221 Paraplegia, complete: Secondary | ICD-10-CM | POA: Diagnosis not present

## 2020-05-12 DIAGNOSIS — L89623 Pressure ulcer of left heel, stage 3: Secondary | ICD-10-CM | POA: Diagnosis not present

## 2020-05-12 DIAGNOSIS — I872 Venous insufficiency (chronic) (peripheral): Secondary | ICD-10-CM | POA: Diagnosis not present

## 2020-05-12 DIAGNOSIS — I890 Lymphedema, not elsewhere classified: Secondary | ICD-10-CM | POA: Diagnosis not present

## 2020-05-12 DIAGNOSIS — L03115 Cellulitis of right lower limb: Secondary | ICD-10-CM | POA: Diagnosis not present

## 2020-05-12 DIAGNOSIS — L97521 Non-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: Secondary | ICD-10-CM | POA: Diagnosis not present

## 2020-05-12 DIAGNOSIS — L97321 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 to breakdown of skin: Secondary | ICD-10-CM | POA: Diagnosis not present

## 2020-05-12 DIAGNOSIS — L97322 Non-pressure chronic ulcer of left 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: Secondary | ICD-10-CM | POA: Diagnosis not present

## 2020-05-12 DIAGNOSIS — L97511 Non-pressure chronic ulcer of other part of right foot limited to breakdown of skin: Secondary | ICD-10-CM | POA: Diagnosis not present

## 2020-05-12 DIAGNOSIS — Z8614 Personal history of Methicillin resistant Staphylococcus aureus infection: Secondary | ICD-10-CM | POA: Diagnosis not present

## 2020-05-13 NOTE — Progress Notes (Signed)
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 week or so ago with hydrogel to keep the area moist. This is been refractory to multiple dressing attempts. He also has areas in his first and second toes bilaterally in the anterior and posterior web space. I had been using Iodoflex here after a prolonged course of silver alginate with ketoconazole was ineffective [question tinea pedis] 07/14/17; patient arrives today with a very difficult adherent material over his left lateral lower leg wound. He also has surrounding erythema and poorly controlled edema. He was switched his Santyl last visit which the nurses are applying once during his doctor visit and once on a nurse visit. He was also reduced to 2 layer compression I'm not exactly sure of the issue here. 07/21/17; better surface today after 1 week of Iodoflex. Significant cellulitis that we treated last week also better. [Doxycycline] 07/28/17 better surface today with now 2 weeks of Iodoflex.  Significant cellulitis treated with doxycycline. He has now completed the doxycycline and he is back to his usual degree of chronic venous inflammation/stasis dermatitis. He reminds me he has had ablations surgery here 08/04/17; continued improvement with Iodoflex  to the left lateral leg wound in terms of the surface of the wound although the dimensions are better. 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 Problematic areas on the plantar aspect of the first second toe web space on the left and the dorsal aspect of the first second toe web space on the right. At one point I felt these were probably related to chronic fungal infections in treated him aggressively for this although we have not made any improvement here. 08/11/17; left lateral leg. Surface continues to improve with the Iodoflex although we are not seeing much improvement in overall wound dimensions. Areas on his plantar left foot and right foot show no improvement. In fact the right foot looks somewhat worse 08/18/17; left lateral leg. We changed to [facility] Blue last week after a prolonged course of Iodoflex which helps get the surface better. It appears that the wound with is improved. Continue with difficult areas on the left dorsal first second and plantar first second on the right 09/01/17; patient arrives in clinic today having had a temperature of 103 yesterday. He was seen in the ER and [facility]. The patient was concerned he could have cellulitis again in the right leg however they diagnosed him with a UTI and he is now on Keflex. He has a history of cellulitis which is been recurrent and difficult but this is been in the left leg, in the past 5 use doxycycline. He does in and out catheterizations at home which are risk factors for UTI 09/08/17; patient will be completing his Keflex this weekend. The erythema on the left leg is considerably better. He has a new wound today on the medial part of the right leg small superficial almost looks like a skin tear. He has worsening of the area on the right dorsal first and second toe. His major area on the left lateral leg is better. Using Hydrofera Blue on all areas 09/15/17;  gradual reduction in width on the long wound in the left lateral leg. No debridement required. He also has wounds on the plantar aspect of his left first second toe web space and on the dorsal aspect of the right first second toe web space. 09/22/17; there continues to be very gradual improvements in the dimensions of the left lateral leg wound. He hasn't round erythematous spot with might be pressure on his wheelchair. There is no evidence obviously of infection no purulence no warmth He has a dry scaled area on the plantar aspect of the left first second toe Improved area on the dorsal right first second toe. 09/29/17; left lateral leg wound continues to improve in dimensions mostly with an is still a fairly long but increasingly narrow wound. He has a dry scaled area on the plantar aspect of his left first second toe web space Increasingly concerning area on the dorsal right first second toe. In fact I am concerned today about possible cellulitis around this wound. The areas extending up his second toe and although there is deformities here almost appears to abut on the nailbed. 10/06/17; left lateral leg wound continues to make very gradual progress. Tissue culture I did from the right first second toe dorsal foot last time grew MRSA  and enterococcus which was vancomycin sensitive. This was not sensitive to clindamycin or doxycycline. He is allergic to Zyvox and sulfa we have therefore arrange for him to have dalvance infusion tomorrow. He is had this in the past and tolerated it well 10/20/17; left lateral leg wound continues to make decent progress. This is certainly reduced in terms of with there is advancing epithelialization.The cellulitis in the right foot looks better although he still has a deep wound in the dorsal aspect of the first second toe web space. Plantar left first toe web space on the left I think is making some progress 10/27/17; left lateral leg wound continues to make decent  progress. Advancing epithelialization.using Hydrofera Blue The right first second toe web space wound is better-looking using silver alginate Improvement in the left plantar first second toe web space. Again using silver alginate 11/03/17 left lateral leg wound continues to make decent progress albeit slowly. Using [facility] The right per second toe web space continues to be a very problematic looking punched out wound. I obtained a piece of tissue for deep culture I did extensively treated this for fungus. It is difficult to imagine that this is a pressure area as the patient states other than going outside he doesn't really wear shoes at home The left plantar first second toe web space looked fairly senescent. Necrotic edges. This required debridement change to [facility] Blue to all wound areas 11/10/17; left lateral leg wound continues to contract. Using Hydrofera Blue On the right dorsal first second toe web space dorsally. Culture I did of this area last week grew MRSA there is not an easy oral option in this patient was multiple antibiotic allergies or intolerances. This was only a rare culture isolate I'm therefore going to use Bactroban under silver alginate On the left plantar first second toe web space. Debridement is required here. This is also unchanged 11/17/17; left lateral leg wound continues to contract using Hydrofera Blue this is no longer the major issue. The major concern here is the right first second toe web space. He now has an open area going from dorsally to the plantar aspect. There is now wound on the inner lateral part of the first toe. Not a very viable surface on this. There is erythema spreading medially into the forefoot. No major change in the left first second toe plantar wound 11/24/17; left lateral leg wound continues to contract using Hydrofera Blue. Nice improvement today The right first second toe web space all of this looks a lot less angry than last week.  I have given him clindamycin and topical Bactroban for MRSA and terbinafine for the possibility of underlining tinea pedis that I could not control with ketoconazole. Looks somewhat better The area on the plantar left first second toe web space is weeping with dried debris around the wound 12/01/17; left lateral leg wound continues to contract he Hydrofera Blue. It is becoming thinner in terms of with nevertheless it is making good improvement. The right first second toe web space looks less angry but still a large necrotic-looking wounds starting on the plantar aspect of the right foot extending between the toes and now extensively on the base of the right second toe. I gave him clindamycin and topical Bactroban for MRSA anterior benefiting for the possibility of underlying tinea pedis. Not looking better today The area on the left first/second toe looks better. Debrided of necrotic debris 12/05/17* the patient was worked in urgently today because over the weekend  he found blood on his incontinence bad when he woke up. He was found to have an ulcer by his wife who does most of his wound care. He came in today for [place] to look at this. He has not had a history of wounds in his buttocks in spite of his paraplegia. 12/08/17; seen in follow-up today at his usual appointment. He was seen earlier this week and found to have a new wound on his buttock. We also follow him for wounds on the left lateral leg, left first second toe web space and right first second toe web space 12/15/17; we have been using Hydrofera Blue to the left lateral leg which has improved. The right first second toe web space has also improved. Left first second toe web space plantar aspect looks stable. The left buttock has worsened using Santyl. Apparently the buttock has drainage 12/22/17; we have been using Hydrofera Blue to the left lateral leg which continues to improve now 2 small wounds separated by normal skin. He tells [place] he had a  fever up to 100 yesterday he is prone to UTIs but has not noted anything different. He does in and out catheterizations. The area between the first and second toes today does not look good necrotic surface covered with what looks to be purulent drainage and erythema extending into the third toe. I had gotten this to something that I thought look better last time however it is not look good today. He also has a necrotic surface over the buttock wound which is expanded. I thought there might be infection under here so I removed a lot of the surface with a #5 curet though nothing look like it really needed culturing. He is been using Santyl to this area 12/27/17; his original wound on the left lateral leg continues to improve using Hydrofera Blue. I gave him samples of Baxdella although he was unable to take them out of fear for an allergic reaction ["lump in his throat"].the culture I did of the purulent drainage from his second toe last week showed both enterococcus and a set Enterobacter I was also concerned about the erythema on the bottom of his foot although paradoxically although this looks somewhat better today. 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 We've been using silver alginate here. They came up today that he sleeps in a recliner, I'm not sure why but I asked him to stop this 01/03/18; his original wound we've been using Hydrofera Blue is now separated into 2 areas. Ulcer on his left buttock is better he is off the recliner and sleeping in bed Finally both wound areas between his first and second toes also looks some better 01/10/18; his original wound on the left lateral leg is now separated into 2 wounds we've been using Hydrofera Blue Ulcer on his left buttock has some drainage. There is a small probing site going into muscle layer superiorly.using silver alginate -He arrives today with a deep  tissue injury on the left heel The wound on the dorsal aspect of his first second toe on the left looks a lot betterusing silver alginate ketoconazole The area on the first second toe web space on the right also looks a lot bette 01/17/18; his original wound on the left lateral leg continues to progress using Hydrofera Blue Ulcer on his left buttock also is smaller surface healthier except for a small probing site going  into the muscle layer superiorly. 2.4 cm of tunneling in this area DTI on his left heel we have only been offloading. Looks better than last week no threatened open no evidence of infection the wound on the dorsal aspect of the first second toe on the left continues to look like it's regressing we have only been using silver alginate and terbinafine orally The area in the first second toe web space on the right also looks to be a lot better using silver alginate and terbinafine I think this was prompted by tinea pedis 01/31/18; the patient was hospitalized in [place] last week apparently for a complicated UTI. He was discharged on cefepime he does in and out catheterizations. In the hospital he was discovered M I don't mild elevation of AST and ALT and the terbinafine was stopped.predictably the pressure ulcer on s his buttock looks betterusing silver alginate. The area on the left lateral leg also is better using Hydrofera Blue. The area between the first and second toes on the left better. First and second toes on the right still substantial but better. Finally the DTI on the left heel has held together and looks like it's resolving 02/07/18-he is here in follow-up evaluation for multiple ulcerations. He has new injury to the lateral aspect of the last issue a pressure ulcer, he states this is from adhesive removal trauma. He states he has tried multiple adhesive products with no success. All other ulcers appear stable. The left heel DTI is resolving. We will continue with same  treatment plan and follow-up next week. 02/14/18; follow-up for multiple areas. He has a new area last week on the lateral aspect of his pressure ulcer more over the posterior trochanter. The original pressure ulcer looks quite stable has healthy granulation. 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 Almost fully epithelialized on the original superior area using Hydrofera Blue DTI on the left heel has peeled off this week to reveal a small superficial wound under denuded skin and subcutaneous tissue Both areas between the first and second toes look better including nothing open on the left 02/21/18; The patient's wounds on his left ischial tuberosity and posterior left greater trochanter actually looked better. He has a large area of irritation around the area which I think is contact dermatitis. I am doubtful that this is fungal His original wound on the left lateral calf continues to improve we have been using Hydrofera Blue There is no open area in the left first second toe web space although there is a lot of thick callus The DTI on the left heel required debridement today of necrotic surface eschar and subcutaneous tissue using silver alginate Finally the area on the right first second toe webspace continues to contract using silver alginate and ketoconazole 02/28/18 Left ischial tuberosity wounds look better using silver alginate. Original wound on the left calf only has one small open area left using Hydrofera Blue DTI on the left heel required debridement mostly removing skin from around this wound surface. Using silver alginate The areas on the right first/second toe web space using silver alginate and ketoconazole 03/08/18 on evaluation today patient appears to be doing decently well as best I can tell in regard to his wounds. This is the first time that I have seen him as he generally  is followed by Dr. Dellia Nims. With that being said none of his wounds appear to be  infected he does have an area where there is some skin covering what appears to be a new wound on the left dorsal surface of his great toe. This is right at the nail bed. With that being said I do believe that debrided away some of the excess skin can be of benefit in this regard. Otherwise he has been tolerating the dressing changes without complication. 03/14/18; patient arrives today with the multiplicity of wounds that we are following. He has not been systemically unwell Original wound on the left lateral calf now only has 2 small open areas we've been using Hydrofera Blue which should continue The deep tissue injury on the left heel requires debridement today. We've been using silver alginate The left first second toe and the right first second toe are both are reminiscence what I think was tinea pedis. Apparently some of the callus Surface between the toes was removed last week when it started draining. Purulent drainage coming from the wound on the ischial tuberosity on the left. 03/21/18-He is here in follow-up evaluation for multiple wounds. 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 He tolerated debridement. The only change to last week's recommendations is to discontinue antifungal cream between toes. He will follow-up next week 03/28/18; following up for multiple wounds;Concern this week is streaking redness and swelling in the right foot. He is going to need antibiotics for this. 03/31/18; follow-up for right foot cellulitis. Streaking redness and swelling in the right foot on 03/28/18. 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 I put him on clindamycin 300 mg every 6 and brought him in for a quick check. He has an open wound between his first and second toes on the right foot as a potential source. 04/04/18; Right foot  cellulitis is resolving he is completing clindamycin. This is truly good news Left lateral calf wound which is initial wound only has one small open area inferiorly this is close to healing out. He has compression stockings. We will use Hydrofera Blue right down to the epithelialization of this Nonviable surface on the left heel which was initially pressure with a DTI. We've been using Hydrofera Blue. I'm going to switch this back to silver alginate Left first second toe/tinea pedis this looks better using silver alginate Right first second toe tinea pedis using silver alginate Large pressure ulcers on theLeft ischial tuberosity. Small wound here Looks better. I am uncertain about the surface over the large wound. Using silver alginate 04/11/18; Cellulitis in the right foot is resolved Left lateral calf wound which was his original wounds still has 2 tiny open areas remaining this is just about closed Nonviable surface on the left heel is better but still requires debridement Left first second toe/tinea pedis still open using silver alginate Right first second toe wound tinea pedis I asked him to go back to using ketoconazole and silver alginate Large pressure ulcers on the left ischial tuberosity this shear injury here is resolved. Wound is smaller. No evidence of infection using silver alginate 04/18/18; Patient arrives with an intense area of cellulitis in the right mid lower calf extending into the right heel area. Bright red and warm. Smaller area on the left anterior leg. He has a significant history of MRSA. He will definitely need antibioticsdoxycycline He now has 2 open areas on the left ischial tuberosity the original large wound and now a satellite area which I think was above his initial satellite  areas. Not a wonderful surface on this satellite area surrounding erythema which looks like pressure related. His left lateral calf wound again his original wound is just about closed Left  heel pressure injury still requiring debridement Left first second toe looks a lot better using silver alginate Right first second toe also using silver alginate and ketoconazole cream also looks better 04/20/18; the patient was worked in early today out of concerns with his cellulitis on the right leg. I had started him on doxycycline. This was 2 days ago. His wife was concerned about the swelling in the area. Also concerned about the left buttock. He has not been systemically unwell no fever chills. No nausea vomiting or diarrhea 04/25/18; the patient's left buttock wound is continued to deteriorate he is using Hydrofera Blue. He is still completing clindamycin for the cellulitis on the right leg although all of this looks better. 05/02/18 Left buttock wound still with a lot of drainage and a very tightly adherent fibrinous necrotic surface. He has a deeper area superiorly The left lateral calf wound is still closed DTI wound on the left heel necrotic surface especially the circumference using Iodoflex Areas between his left first second toe and right first second toe both look better. Dorsally and the right first second toe he had a necrotic surface although at smaller. In using silver alginate and ketoconazole. I did a culture last week which was a deep tissue culture of the reminiscence of the open wound on the right first second toe dorsally. This grew a few Acinetobacter and a few methicillin-resistant staph aureus. Nevertheless the area actually this week looked better. I didn't feel the need to specifically address this at least in terms of systemic antibiotics. 05/09/18; wounds are measuring larger more drainage per our intake. We are using Santyl covered with alginate on the large superficial buttock wounds, Iodosorb on the left heel, ketoconazole and silver alginate to the dorsal first and second toes bilaterally. 05/16/18; The area on his left buttock better in some aspects although the  area superiorly over the ischial tuberosity required an extensive debridement.using Santyl Left heel appears stable. Using Iodoflex The areas between his first and second toes are not bad however there is spreading erythema up the dorsal aspect of his left foot this looks like cellulitis again. He is insensate the erythema is really very brilliant.o Erysipelas He went to see an allergist days ago because he was itching part of this he had lab work done. This showed a white count of 15.1 with 70% neutrophils. Hemoglobin of 11.4 and a platelet count of 659,000. Last white count we had in Epic was a 2-1/2 years ago which was 25.9 but he was ill at the time. He was able to show me some lab work that was done by his primary physician the pattern is about the same. 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 But prompted me to go ahead and do x-rays of both feet and the pelvis rule out osteomyelitis. He also had a comprehensive metabolic panel this was reasonably normal his albumin was 3.7 liver function tests BUN/creatinine all normal 05/23/18; x-rays of both his feet from last week were negative for underlying pulmonary abnormality. The x-ray of his pelvis however showed mild irregularity in the left ischial which may represent some early osteomyelitis. The wound in the left ischial continues to get deeper clearly now exposed muscle. Each week necrotic surface material over this area.  Whereas the rest of the wounds do not look so bad. The left ischial wound we have been using Santyl and calcium alginate T the left heel surface necrotic debris using Iodoflex o The left lateral leg is still healed Areas on the left dorsal foot and the right dorsal foot are about the same. There is some inflammation on the left which might represent contact dermatitis, fungal dermatitis I am doubtful cellulitis although this looks better than last week 05/30/18; CT scan done at  Hospital did not show any osteomyelitis or abscess. Suggested the possibility of underlying cellulitis although I don't see a lot of evidence of this at the bedside The wound itself on the left buttock/upper thigh actually looks somewhat better. No debridement Left heel also looks better no debridement continue Iodoflex Both dorsal first second toe spaces appear better using Lotrisone. Left still required debridement 06/06/18; Intake reported some purulent looking drainage from the left gluteal wound. Using Santyl and calcium alginate Left heel looks better although still a nonviable surface requiring debridement The left dorsal foot first/second webspace actually expanding and somewhat deeper. I may consider doing a shave biopsy of this area Right dorsal foot first/second webspace appears stable to improved. Using Lotrisone and silver alginate to both these areas 06/13/18 Left gluteal surface looks better. Now separated in the 2 wounds. No debridement required. Still drainage. We'll continue silver alginate Left heel continues to look better with Iodoflex continue this for at least another week Of his dorsal foot wounds the area on the left still has some depth although it looks better than last week. We've been using Lotrisone and silver alginate 06/20/18 Left gluteal continues to look better healthy tissue Left heel continues to look better healthy granulation wound is smaller. He is using Iodoflex and his long as this continues continue the Iodoflex Dorsal right foot looks better unfortunately dorsal left foot does not. There is swelling and erythema of his forefoot. He had minor trauma to this several days ago but doesn't think this was enough to have caused any tissue injury. Foot looks like cellulitis, we have had this problem before 06/27/18 on evaluation today patient appears to be doing a little worse in regard to his foot ulcer. Unfortunately it does appear that he has  methicillin-resistant staph aureus and unfortunately there really are no oral options for him as he's allergic to sulfa drugs as well as I box. Both of which would really be his only options for treating this infection. In the past he has been given and effusion of Orbactiv. This is done very well for him in the past again it's one time dosing IV antibiotic therapy. Subsequently I do believe this is something we're gonna need to see about doing at this point in time. Currently his other wounds seem to be doing somewhat better in my pinion I'm pretty happy in that regard. 07/03/18 on evaluation today patient's wounds actually appear to be doing fairly well. He has been tolerating the dressing changes without complication. All in all he seems to be showing signs of improvement. In regard to the antibiotics he has been dealing with infectious disease since I saw him last week as far as getting this scheduled. In the end [facility]'s [facility] this coming Friday. In the meantime he has been continuing to perform the dressing changes in such as previous. There does not appear to be any evidence of infection worsengin at  this time. 07/10/18; Since I last saw this man 2 weeks ago things have actually improved. IV antibiotics of resulted in less forefoot erythema although there is still some present. He is not systemically unwell Left buttock wounds 2 now have no depth there is increased epithelialization Using silver alginate Left heel still requires debridement using Iodoflex Left dorsal foot still with a sizable wound about the size of a border but healthy granulation Right dorsal foot still with a slitlike area using silver alginate 07/18/18; the patient's cellulitis in the left foot is improved in fact I think it is on its way to resolving. Left buttock wounds 2 both look better although the larger one has hypertension granulation we've been using silver  alginate Left heel has some thick circumferential redundant skin over the wound edge which will need to be removed today we've been using Iodoflex Left dorsal foot is still a sizable wound required debridement using silver alginate The right dorsal foot is just about closed only a small open area remains here 07/25/18; left foot cellulitis is resolved Left buttock wounds 2 both look better. Hyper-granulation on the major area Left heel as some debris over the surface but otherwise looks a healthier wound. Using silver collagen Right dorsal foot is just about closed 07/31/18; arrives with our intake nurse worried about purulent drainage from the buttock. We had hyper-granulation here last week His buttock wounds 2 continue to look better Left heel some debris over the surface but measuring smaller. Right dorsal foot unfortunately has openings between the toes Left foot superficial wound looks less aggravated. 08/07/18 Buttock wounds continue to look better although some of her granulation and the larger medial wound. silver alginate Left heel continues to look a lot better.silver collagen Left foot superficial wound looks less stable. Requires debridement. He has a new wound superficial area on the foot on the lateral dorsal foot. Right foot looks better using silver alginate without Lotrisone 08/14/2018; patient was in the ER last week diagnosed with a UTI. He is now on Cefpodoxime and Macrodantin. Buttock wounds continued to be smaller. Using silver alginate Left heel continues to look better using silver collagen Left foot superficial wound looks as though it is improving Right dorsal foot area is just about healed. 08/21/2018; patient is completed his antibiotics for his UTI. He has 2 open areas on the buttocks. There is still not closed although the surface looks satisfactory. Using silver alginate Left heel continues to improve using silver collagen The bilateral dorsal foot areas which are  at the base of his first and second toes/possible tinea pedis are actually stable on the left but worse on the right. The area on the left required debridement of necrotic surface. After debridement I obtained a specimen for PCR culture. The right dorsal foot which is been just about healed last week is now reopened 08/28/2018; culture done on the left dorsal foot showed coag negative staph both staph epidermidis and Lugdunensis. I think this is worthwhile initiating systemic treatment. I will use doxycycline given his long list of allergies. The area on the left heel slightly improved but still requiring debridement. The large wound on the buttock is just about closed whereas the smaller one is larger. Using silver alginate in this area 09/04/2018; patient is completing his doxycycline for the left foot although this continues to be a very difficult wound area with very adherent necrotic debris. We are using silver alginate to all his wounds right foot left foot and the small  wounds on his buttock, silver collagen on the left heel. 09/11/2018; once again this patient has intense erythema and swelling of the left forefoot. Lesser degrees of erythema in the right foot. He has a long list of allergies and intolerances. I will reinstitute doxycycline. 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 Using silver alginate Left heel also looks better using silver collagen Unfortunately both the areas on his feet look worse. The area on the left first second webspace is now gone through to the plantar part of his foot. The area on the left foot anteriorly is irritated with erythema and swelling in the forefoot. 09/25/2018 His wound on the left plantar heel looks better. Using silver collagen The area on the left buttock 2 small remnant areas. One is closed one is still open. Using silver alginate The areas between both his first and second toes look worse. This in  spite of long-standing antifungal therapy with ketoconazole and silver alginate which should have antifungal activity He has small areas around his original wound on the left calf one is on the bottom of the original scar tissue and one superiorly both of these are small and superficial but again given wound history in this site this is worrisome 10/02/2018 Left plantar heel continues to gradually contract using silver collagen Left buttock wound is unchanged using silver alginate The areas on his dorsal feet between his first and second toes bilaterally look about the same. I prescribed clindamycin ointment to see if we can address chronic staph colonization and also the underlying possibility of erythrasma The left lateral lower extremity wound is actually on the lateral part of his ankle. Small open area here. We have been using silver alginate 10/09/2018; Left plantar heel continues to look healthy and contract. No debridement is required Left buttock slightly smaller with a tape injury wound just below which was new this week Dorsal feet somewhat improved I have been using clindamycin Left lateral looks lower extremity the actual open area looks worse although a lot of this is epithelialized. I am going to change to silver collagen today He has a lot more swelling in the right leg although this is not pitting not red and not particularly warm there is a lot of spasm in the right leg usually indicative of people with paralysis of some underlying discomfort. We have reviewed his vascular status from 2017 he had a left greater saphenous vein ablation. I wonder about referring him back to vascular surgery if the area on the left leg continues to deteriorate. 10/16/2018 in today for follow-up and management of multiple lower extremity ulcers. His left Buttock wound is much lower smaller and almost closed completely. The wound to the left ankle has began to reopen with Epithelialization and some  adherent slough. He has multiple new areas to the left foot and leg. The left dorsal foot without much improvement. Wound present between left great webspace and 2nd toe. Erythema and edema present right leg. Right LE ultrasound obtained on 10/10/18 was negative for DVT . 10/23/2018; Left buttock is closed over. Still dry macerated skin but there is no open wound. I suspect this is chronic pressure/moisture Left lateral calf is quite a bit worse than when I saw this last. There is clearly drainage here he has macerated skin into the left plantar heel. We will change the primary dressing to alginate Left dorsal foot has some improvement in overall wound area. Still  using clindamycin and silver alginate Right dorsal foot about the same as the left using clindamycin and silver alginate The erythema in the right leg has resolved. He is DVT rule out was negative Left heel pressure area required debridement although the wound is smaller and the surface is health 10/26/2018 The patient came back in for his nurse check today predominantly because of the drainage coming out of the left lateral leg with a recent reopening of his original wound on the left lateral calf. He comes in today with a large amount of surrounding erythema around the wound extending from the calf into the ankle and even in the area on the dorsal foot. He is not systemically unwell. He is not febrile. Nevertheless this looks like cellulitis. We have been using silver alginate to the area. I changed him to a regular visit and I am going to prescribe him doxycycline. The rationale here is a long list of medication intolerances and a history of MRSA. I did not see anything that I thought would provide a valuable culture 10/30/2018 Follow-up from his appointment 4 days ago with really an extensive area of cellulitis in the left calf left lateral ankle and left dorsal foot. I put him on doxycycline. He has a long list of medication allergies  which are true allergy reactions. 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 In any case he is a lot better today. The erythema especially in the anterior and lateral left calf is better. He still has left ankle erythema. He also is complaining about increasing edema in the right leg we have only been using Kerlix Coban and he has been doing the wraps at home. Finally he has a spotty rash on the medial part of his upper left calf which looks like folliculitis or perhaps wrap occlusion type injury. Small superficial macules not pustules 11/06/18 patient arrives today with again a considerable degree of erythema around the wound on the left lateral calf extending into the dorsal ankle and dorsal foot. This is a lot worse than when I saw this last week. He is on doxycycline really with not a lot of improvement. He has not been systemically unwell Wounds on the; left heel actually looks improved. Original area on the left foot and proximity to the first and second toes looks about the same. He has superficial areas on the dorsal foot, anterior calf and then the reopening of his original wound on the left lateral calf which looks about the same The only area he has on the right is the dorsal webspace first and second which is smaller. He has a large area of dry erythematous skin on the left buttock small open area here. 11/13/2018; the patient arrives in much better condition. The erythema around the wound on the left lateral calf is a lot better. Not sure whether this was the clindamycin or the TCA and ketoconazole or just in the improvement in edema control [stasis dermatitis]. In any case this is a lot better. The area on the left heel is very small and just about resolved using silver collagen we have been using silver alginate to the areas on his dorsal feet 11/20/2018; his wounds include the left lateral calf, left heel, dorsal  aspects of both feet just proximal to the first second webspace. He is stable to slightly improved. I did not think any changes to his dressings were going to be necessary 11/27/2018 he  has a reopening on the left buttock which is surrounded by what looks like tinea or perhaps some other form of dermatitis. The area on the left dorsal foot has some erythema around it I have marked this area but I am not sure whether this is cellulitis or not. Left heel is not closed. Left calf the reopening is really slightly longer and probably worse 1/13; in general things look better and smaller except for the left dorsal foot. Area on the left heel is just about closed, left buttock looks better only a small wound remains in the skin looks better [using Lotrisone] 1/20; the area on the left heel only has a few remaining open areas here. Left lateral calf about the same in terms of size, left dorsal foot slightly larger right lateral foot still not closed. The area on the left buttock has no open wound and the surrounding skin looks a lot better 1/27; the area on the left heel is closed. Left lateral calf better but still requiring extensive debridements. The area on his left buttock is closed. He still has the open areas on the left dorsal foot which is slightly smaller in the right foot which is slightly expanded. We have been using Iodoflex on these areas as well 2/3; left heel is closed. Left lateral calf still requiring debridement using Iodoflex there is no open area on his left buttock however he has dry scaly skin over a large area of this. Not really responding well to the Lotrisone. Finally the areas on his dorsal feet at the level of the first second webspace are slightly smaller on the right and about the same on the left. Both of these vigorously debrided with Anasept and gauze 2/10; left heel remains closed he has dry erythematous skin over the left buttock but there is no open wound here. Left lateral  leg has come in and with. Still requiring debridement we have been using Iodoflex here. Finally the area on the left dorsal foot and right dorsal foot are really about the same extremely dry callused fissured areas. He does not yet have a dermatology appointment 2/17; left heel remains closed. He has a new open area on the left buttock. The area on the left lateral calf is bigger longer and still covered in necrotic debris. No major change in his foot areas bilaterally. I am awaiting for a dermatologist to look on this. We have been using ketoconazole I do not know that this is been doing any good at all. 2/24; left heel remains closed. The left buttock wound that was new reopening last week looks better. The left lateral calf appears better also although still requires debridement. The major area on his foot is the left first second also requiring debridement. We have been putting Prisma on all wounds. I do not believe that the ketoconazole has done too much good for his feet. He will use Lotrisone I am going to give him a 2-week course of terbinafine. We still do not have a dermatology appointment 3/2 left heel remains closed however there is skin over bone in this area I pointed this out to him today. The left buttock wound is epithelialized but still does not look completely stable. The area on the left leg required debridement were using silver collagen here. With regards to his feet we changed to Lotrisone last week and silver alginate. 3/9; left heel remains closed. Left buttock remains closed. The area on the right foot is essentially closed. The  left foot remains unchanged. Slightly smaller on the left lateral calf. Using silver collagen to both of these areas 3/16-Left heel remains closed. Area on right foot is closed. Left lateral calf above the lateral malleolus open wound requiring debridement with easy bleeding. Left dorsal wound proximal to first toe also debrided. Left ischial area  open new. Patient has been using Prisma with wrapping every 3 days. Dermatology appointment is apparently tomorrow.Patient has completed his terbinafine 2-week course with some apparent improvement according to him, there is still flaking and dry skin in his foot on the left 3/23; area on the right foot is reopened. The area on the left anterior foot is about the same still a very necrotic adherent surface. He still has the area on the left leg and reopening is on the left buttock. He apparently saw dermatology although I do not have a note. According to the patient who is usually fairly well informed they did not have any good ideas. Put him on oral terbinafine which she is been on before. 3/30; using silver collagen to all wounds. Apparently his dermatologist put him on doxycycline and rifampin presumably some culture grew staph. I do not have this result. He remains on terbinafine although I have used terbinafine on him before 4/6; patient has had a fairly substantial reopening on the right foot between the first and second toes. He is finished his terbinafine and I believe is on doxycycline and rifampin still as prescribed by dermatology. We have been using silver collagen to all his wounds although the patient reports that he thinks silver alginate does better on the wounds on his buttock. 4/13; the area on his left lateral calf about the same size but it did not require debridement. Left dorsal foot just proximal to the webspace between the first and second toes is about the same. Still nonviable surface. I note some superficial bronze discoloration of the dorsal part of his foot Right dorsal foot just proximal to the first and second toes also looks about the same. I still think there may be the same discoloration I noted above on the left Left buttock wound looks about the same 4/20; left lateral calf appears to be gradually contracting using silver collagen. He remains on erythromycin  empiric treatment for possible erythrasma involving his digital spaces. The left dorsal foot wound is debrided of tightly adherent necrotic debris and really cleans up quite nicely. The right area is worse with expansion. I did not debride this it is now over the base of the second toe The area on his left buttock is smaller no debridement is required using silver collagen 5/4; left calf continues to make good progress. He arrives with erythema around the wounds on his dorsal foot which even extends to the plantar aspect. Very concerning for coexistent infection. He is finished the erythromycin I gave him for possible erythrasma this does not seem to have helped. The area on the left foot is about the same base of the dorsal toes Is area on the buttock looks improved on the left 5/11; left calf and left buttock continued to make good progress. Left foot is about the same to slightly improved. Major problem is on the right foot. He has not had an x-ray. Deep tissue culture I did last week showed both Enterobacter and E. coli. I did not change the doxycycline I put him on empirically although neither 1 of these were plated to doxycycline. He arrives today with the erythema looking worse  on both the dorsal and plantar foot. Macerated skin on the bottom of the foot. he has not been systemically unwell 5/18-Patient returns at 1 week, left calf wound appears to be making some progress, left buttock wound appears slightly worse than last time, left foot wound looks slightly better, right foot redness is marginally better. X-ray of both feet show no air or evidence of osteomyelitis. Patient is finished his Omnicef and terbinafine. He continues to have macerated skin on the bottom of the left foot as well as right 5/26; left calf wound is better, left buttock wound appears to have multiple small superficial open areas with surrounding macerated skin. X-rays that I did last time showed no evidence of  osteomyelitis in either foot. He is finished cefdinir and doxycycline. I do not think that he was on terbinafine. He continues to have a large superficial open area on the right foot anterior dorsal and slightly between the first and second toes. I did send him to dermatology 2 months ago or so wondering about whether they would do a fungal scraping. I do not believe they did but did do a culture. We have been using silver alginate to the toe areas, he has been using antifungals at home topically either ketoconazole or Lotrisone. We are using silver collagen on the left foot, silver alginate on the right, silver collagen on the left lateral leg and silver alginate on the left buttock 6/1; left buttock area is healed. We have the left dorsal foot, left lateral leg and right dorsal foot. We are using silver alginate to the areas on both feet and silver collagen to the area on his left lateral calf 6/8; the left buttock apparently reopened late last week. He is not really sure how this happened. He is tolerating the terbinafine. Using silver alginate to all wounds 6/15; left buttock wound is larger than last week but still superficial. Came in the clinic today with a report of purulence from the left lateral leg I did not identify any infection Both areas on his dorsal feet appear to be better. He is tolerating the terbinafine. Using silver alginate to all wounds 6/22; left buttock is about the same this week, left calf quite a bit better. His left foot is about the same however he comes in with erythema and warmth in the right forefoot once again. Culture that I gave him in the beginning of May showed Enterobacter and E. coli. I gave him doxycycline and things seem to improve although neither 1 of these organisms was specifically plated. 6/29; left buttock is larger and dry this week. Left lateral calf looks to me to be improved. Left dorsal foot also somewhat improved right foot completely unchanged.  The erythema on the right foot is still present. He is completing the Ceftin dinner that I gave him empirically [see discussion above.) 7/6 - All wounds look to be stable and perhaps improved, the left buttock wound is slightly smaller, per patient bleeds easily, completed ceftin, the right foot redness is less, he is on terbinafine 7/13; left buttock wound about the same perhaps slightly narrower. Area on the left lateral leg continues to narrow. Left dorsal foot slightly smaller right foot about the same. We are using silver alginate on the right foot and Hydrofera Blue to the areas on the left. Unna boot on the left 2 layer compression on the right 7/20; left buttock wound absolutely the same. Area on lateral leg continues to get better. Left dorsal foot  require debridement as did the right no major change in the 7/27; left buttock wound the same size necrotic debris over the surface. The area on the lateral leg is closed once again. His left foot looks better right foot about the same although there is some involvement now of the posterior first second toe area. He is still on terbinafine which I have given him for a month, not certain a centimeter major change 06/25/19-All wounds appear to be slightly improved according to report, left buttock wound looks clean, both foot wounds have minimal to no debris the right dorsal foot has minimal slough. We are using Hydrofera Blue to the left and silver alginate to the right foot and ischial wound. 8/10-Wounds all appear to be around the same, the right forefoot distal part has some redness which was not there before, however the wound looks clean and small. Ischial wound looks about the same with no changes 8/17; his wound on the left lateral calf which was his original chronic venous insufficiency wound remains closed. Since I last saw him the areas on the left dorsal foot right dorsal foot generally appear better but require debridement. The area on  his left initial tuberosity appears somewhat larger to me perhaps hyper granulated and bleeds very easily. We have been using Hydrofera Blue to the left dorsal foot and silver alginate to everything else 8/24; left lateral calf remains closed. The areas on his dorsal feet on the webspace of the first and second toes bilaterally both look better. The area on the left buttock which is the pressure ulcer stage II slightly smaller. I change the dressing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 actually looks somewhat better. There is still copious drainage and some erythema but it is generally better. The oral options that were obvious including Zyvox and sulfonamides he has rash issues both of these. This is sensitive to rifampin but this is not usually used along gentamicin but this is parenteral and again not used along. The obvious alternative is vancomycin. He has had his arterial studies. [name] T brachial index was 1.3 oe on the right. His waveforms were  biphasic bilaterally. Doppler waveforms of the digit were normal in the right damp and on the left. Comment that this could've been due to extreme edema. His venous studies show reflux on both sides in the femoral popliteal veins as well as the greater and lesser saphenous veins bilaterally. Ultimately he is going to need to see vascular surgery about this issue. Hopefully when we can get his wounds and a little better shape. 02/19/16; the patient was able to complete a course of [facility]'s for MRSA in the face of multiple antibiotic allergies. Arterial studies showed an ABI of him 0.88 on the right 1.17 on the left the. Waveforms were biphasic at the posterior tibial and dorsalis pedis digital waveforms were normal. Right toe brachial index was 1.3 limited by shaking and edema. His venous study showed widespread reflux in the left at the common femoral vein the greater and lesser saphenous vein the greater and lesser saphenous vein on the right as well as the popliteal and femoral vein. The popliteal and femoral vein on the left did not show reflux. His wounds on the right leg give healed on the left he is still using Santyl. 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 The erythema has not really resolved and I wonder if this is mostly venous inflammation rather than cellulitis. Still using Santyl. He is approved for Apligraf 03/04/16; there is less erythema around the wound. Both wounds require aggressive surgical debridement. Not yet ready for Apligraf 03/11/16; aggressive debridement again. Not ready for Apligraf 03/18/16 aggressive debridement again. 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 Has been to see vascular surgery he is being planned for a venous ablation 03/25/16; aggressive debridement again of both wound areas on the left lateral leg. He is due for ablation surgery on May 22. He is much closer to being ready for an Apligraf. Has  a new area between the left first and second toes 04/01/16 aggressive debridement done of both wounds. The new wound at the base of between his second and first toes looks stable 04/08/16; continued aggressive debridement of both wounds on the left lower leg. He goes for his venous ablation on Monday. The new wound at the base of his first and second toes dorsally appears stable. 04/15/16; wounds aggressively debridement although the base of  this looks considerably better Apligraf #1. He had ablation surgery on Monday I'll need to research these records. We only have approval for four Apligraf's 04/22/16; the patient is here for a wound check [Apligraf last week] intake nurse concerned about erythema around the wounds. Apparently a significant degree of drainage. The patient has chronic venous inflammation which I think accounts for most of this however I was asked to look at this today 04/26/16; the patient came back for check of possible cellulitis in his left foot however the Apligraf dressing was inadvertently removed therefore we elected to prep the wound for a second Apligraf. I put him on doxycycline on 6/1 the erythema in the foot 05/03/16 we did not remove the dressing from the superior wound as this is where I put all of his last Apligraf. Surface debridement done with a curette of the lower wound which looks very healthy. The area on the left foot also looks quite satisfactory at the dorsal artery at the first and second toes 05/10/16; continue Apligraf to this. Her wound, Hydrafera to the lower wound. He has a new area on the right second toe. Left dorsal foot firstoosecond toe also looks improved 05/24/16; wound dimensions must be smaller I was able to use Apligraf to all 3 remaining wound areas. 06/07/16 patient's last Apligraf was 2 weeks ago. He arrives today with the 2 wounds on his lateral left leg joined together. This would have to be seen as a negative. He also has a small wound in his  first and second toe on the left dorsally with quite a bit of surrounding erythema in the first second and third toes. This looks to be infected or inflamed, very difficult clinical call. 06/21/16: lateral left leg combined wounds. Adherent surface slough area on the left dorsal foot at roughly the fourth toe looks improved 07/12/16; he now has a single linear wound on the lateral left leg. This does not look to be a lot changed from when I lost saw this. The area on his dorsal left foot looks considerably better however. 08/02/16; no major change in the substantial area on his left lateral leg since last time. We have been using Hydrofera Blue for a prolonged period of time now. The area on his left foot is also unchanged from last review 07/19/16; the area on his dorsal foot on the left looks considerably smaller. He is beginning to have significant rims of epithelialization on the lateral left leg wound. This also looks better. 08/05/16; the patient came in for a nurse visit today. Apparently the area on his left lateral leg looks better and it was wrapped. However in general discussion the patient noted a new area on the dorsal aspect of his right second toe. The exact etiology of this is unclear but likely relates to pressure. 08/09/16 really the area on the left lateral leg did not really look that healthy today perhaps slightly larger and measurements. The area on his dorsal right second toe is improved also the left foot wound looks stable to improved 08/16/16; the area on the last lateral leg did not change any of dimensions. Post debridement with a curet the area looked better. Left foot wound improved and the area on the dorsal right second toe is improved 08/23/16; the area on the left lateral leg may be slightly smaller both in terms of length and width. Aggressive debridement with a curette afterwards the tissue appears healthier. Left foot wound appears improved in the area on  the dorsal right  second toe is improved 08/30/16 patient developed a fever over the weekend and was seen in an urgent care. Felt to have a UTI and put on doxycycline. He has been since changed over the phone to [facility]. After we took off the wrap on his right leg today the leg is swollen warm and erythematous, probably more likely the source of the fever 09/06/16; have been using collagen to the major left leg wound, silver alginate to the area on his anterior foot/toes 09/13/16; the areas on his anterior foot/toes on both sides appear to be virtually closed. Extensive wound on the left lateral leg perhaps slightly narrower but each visit still covered an adherent surface slough 09/16/16 patient was in for his usual Thursday nurse visit however the intake nurse noted significant erythema of his dorsal right foot. He is also running a low- grade fever and having increasing spasms in the right leg 09/20/16 here for cellulitis involving his right great toes and forefoot. This is a lot better. Still requiring debridement on his left lateral leg. Santyl direct says he needs prior authorization. Therefore his wife cannot change this at home 09/30/16; the patient's extensive area on the left lateral calf and ankle perhaps somewhat better. Using Santyl. The area on the left toes is healed and I think the area on his right dorsal foot is healed as well. There is no cellulitis or venous inflammation involving the right leg. He is going to need compression stockings here. 10/07/16; the patient's extensive wound on the left lateral calf and ankle does not measure any differently however there appears to be less adherent surface slough using Santyl and aggressive weekly debridements 10/21/16; no major change in the area on the left lateral calf. Still the same measurement still very difficult to debridement adherent slough and nonviable subcutaneous tissue. This is not really been helped by several weeks of Santyl. Previously for  2 weeks I used Iodoflex for a short period. A prolonged course of Hydrofera Blue didn't really help. I'm not sure why I only used 2 weeks of Iodoflex on this there is no evidence of surrounding infection. He has a small area on the right second toe which looks as though it's progressing towards closure 10/28/16; the wounds on his toes appear to be closed. No major change in the left lateral leg wound although the surface looks somewhat better using Iodoflex. He has had previous arterial studies that were normal. He has had reflux studies and is status post ablation although I don't have any exact notes on which vein was ablated. I'll need to check the surgical record 11/04/16; he's had a reopening between the first and second toe on the left and right. No major change in the left lateral leg wound. There is what appears to be cellulitis of the left dorsal foot 11/18/16 the patient was hospitalized initially in [place] and then subsequently transferred to [facility] and was admitted there from 11/09/16 through 11/12/16. He had developed progressive cellulitis on the right leg in spite of the doxycycline I gave him. I'd spoken to the hospitalist in [place] who was concerned about continuing leukocytosis. CT scan is what I suggested this was done which showed soft tissue swelling without evidence of osteomyelitis or an underlying abscess blood cultures were negative. At [facility] he was treated with vancomycin and Primaxin and then add an infectious disease consult. He was transitioned to Ceftaroline. He has been making progressive improvement. Overall a severe cellulitis of  the right leg. He is been using silver alginate to her original wound on the left leg. The wounds in his toes on the right are closed there is a small open area on the base of the left second toe 11/26/15; the patient's right leg is much better although there is still some edema here this could be reminiscent from his severe  cellulitis likely on top of some degree of lymphedema. His left anterior leg wound has less surface slough as reported by her intake nurse. Small wound at the base of the left second toe 12/02/16; patient's right leg is better and there is no open wound here. His left anterior lateral leg wound continues to have a healthy-looking surface. Small wound at the base of the left second toe however there is erythema in the left forefoot which is worrisome 12/16/16; is no open wounds on his right leg. We took measurements for stockings. His left anterior lateral leg wound continues to have a healthy-looking surface. I'm not sure where we were with the Apligraf run through his insurance. We have been using Iodoflex. He has a thick eschar on the left first second toe interface, I suspect this may be fungal however there is no visible open 12/23/16; no open wound on his right leg. He has 2 small areas left of the linear wound that was remaining last week. We have been using Prisma, I thought I have disclosed this week, we can only look forward to next week 01/03/17; the patient had concerning areas of erythema last week, already on doxycycline for UTI through his primary doctor. The erythema is absolutely no better there is warmth and swelling both medially from the left lateral leg wound and also the dorsal left foot. 01/06/17- Patient is here for follow-up evaluation of his left lateral leg ulcer and bilateral feet ulcers. He is on oral antibiotic therapy, tolerating that. Nursing staff and the patient states that the erythema is improved from Monday. 01/13/17; the predominant left lateral leg wound continues to be problematic. I had put Apligraf on him earlier this month once. However he subsequently developed what appeared to be an intense cellulitis around the left lateral leg wound. I gave him Dalvance I think on 2/12 perhaps 2/13 he continues on cefdinir. The erythema is still present but the warmth and  swelling is improved. I am hopeful that the cellulitis part of this control. I wouldn't be surprised if there is an element of venous inflammation as well. 01/17/17. The erythema is present but better in the left leg. His left lateral leg wound still does not have a viable surface buttons certain parts of this long thin wound it appears like there has been improvement in dimensions. 01/20/17; the erythema still present but much better in the left leg. I'm thinking this is his usual degree of chronic venous inflammation. The wound on the left leg looks somewhat better. Is less surface slough 01/27/17; erythema is back to the chronic venous inflammation. The wound on the left leg is somewhat better. I am back to the point where I like to try an Apligraf once again 02/10/17; slight improvement in wound dimensions. Apligraf #2. He is completing his doxycycline 02/14/17; patient arrives today having completed doxycycline last Thursday. This was supposed to be a nurse visit however once again he hasn't tense erythema from the medial part of his wound extending over the lower leg. Also erythema in his foot this is roughly in the same distribution as last time. He  has baseline chronic venous inflammation however this is a lot worse than the baseline I have learned to accept the on him is baseline inflammation 02/24/17- patient is here for follow-up evaluation. He is tolerating compression therapy. His voicing no complaints or concerns he is here anticipating an Apligraf 03/03/17; he arrives today with an adherent necrotic surface. I don't think this is surface is going to be amenable for Apligraf's. The erythema around his wound and on the left dorsal foot has resolved he is off antibiotics 03/10/17; better-looking surface today. I don't think he can tolerate Apligraf's. He tells me he had a wound VAC after a skin graft years ago to this area and they had difficulty with a seal. The erythema continues to be stable  around this some degree of chronic venous inflammation but he also has recurrent cellulitis. We have been using Iodoflex 03/17/17; continued improvement in the surface and may be small changes in dimensions. Using Iodoflex which seems the only thing that will control his surface 03/24/17- He is here for follow up evaluation of his LLE lateral ulceration and ulcer to right dorsal foot/toe space. He is voicing no complaints or concerns, He is tolerating compression wrap. 03/31/17 arrives today with a much healthier looking wound on the left lower extremity. We have been using Iodoflex for a prolonged period of time which has for the first time prepared and adequate looking wound bed although we have not had much in the way of wound dimension improvement. He also has a small wound between the first and second toe on the right 04/07/17; arrives today with a healthy-looking wound bed and at least the top 50% of this wound appears to be now her. No debridement was required I have changed him to [facility] last week after prolonged Iodoflex. He did not do well with Apligraf's. We've had a re-opening between the first and second toe on the right 04/14/17; arrives today with a healthier looking wound bed contractions and the top 50% of this wound and some on the lesser 50%. Wound bed appears healthy. The area between the first and second toe on the right still remains problematic 04/21/17; continued very gradual improvement. Using [facility] 04/28/17; continued very gradual improvement in the left lateral leg venous insufficiency wound. His periwound erythema is very mild. We have been using Hydrofera Blue. Wound is making progress especially in the superior 50% 05/05/17; he continues to have very gradual improvement in the left lateral venous insufficiency wound. Both in terms with an length rings are improving. I debrided this every 2 weeks with #5 curet and we have been using Hydrofera Blue and again  making good progress With regards to the wounds between his right first and second toe which I thought might of been tinea pedis he is not making as much progress very dry scaly skin over the area. Also the area at the base of the left first and second toe in a similar condition 05/12/17; continued gradual improvement in the refractory left lateral venous insufficiency wound on the left. Dimension smaller. Surface still requiring debridement using Hydrofera Blue 05/19/17; continued gradual improvement in the refractory left lateral venous ulceration. Careful inspection of the wound bed underlying rumination suggested some degree of epithelialization over the surface no debridement indicated. Continue Hydrofera Blue difficult areas between his toes first and third on the left than first and second on the right. I'm going to change to silver alginate from silver collagen. Continue ketoconazole as I suspect underlying  tinea pedis 05/26/17; left lateral leg venous insufficiency wound. We've been using Hydrofera Blue. I believe that there is expanding epithelialization over the surface of the wound albeit not coming from the wound circumference. This is a bit of an odd situation in which the epithelialization seems to be coming from the surface of the wound rather than in the exact circumference. There is still small open areas mostly along the lateral margin of the wound. ooHe has unchanged areas between the left first and second and the right first second toes which I been treating for tenia pedis 06/02/17; left lateral leg venous insufficiency wound. We have been using Hydrofera Blue. Somewhat smaller from the wound circumference. The surface of the wound remains a bit on it almost epithelialized sedation in appearance. I use an open curette today debridement in the surface of all of this especially the edges ooSmall open wounds remaining on the dorsal right first and second toe interspace and the plantar  left first second toe and her face on the left 06/09/17; wound on the left lateral leg continues to be smaller but very gradual and very dry surface using Hydrofera Blue 06/16/17 requires weekly debridements now on the left lateral leg although this continues to contract. I changed to silver collagen last week because of dryness of the wound bed. Using Iodoflex to the areas on his first and second toes/web space bilaterally 06/24/17; patient with history of paraplegia also chronic venous insufficiency with lymphedema. Has a very difficult wound on the left lateral leg. This has been gradually reducing in terms of with but comes in with a very dry adherent surface. High switch to silver collagen a week or so ago with hydrogel to keep the area moist. This is been refractory to multiple dressing attempts. He also has areas in his first and second toes bilaterally in the anterior and posterior web space. I had been using Iodoflex here after a prolonged course of silver alginate with ketoconazole was ineffective [question tinea pedis] 07/14/17; patient arrives today with a very difficult adherent material over his left lateral lower leg wound. He also has surrounding erythema and poorly controlled edema. He was switched his Santyl last visit which the nurses are applying once during his doctor visit and once on a nurse visit. He was also reduced to 2 layer compression I'm not exactly sure of the issue here. 07/21/17; better surface today after 1 week of Iodoflex. Significant cellulitis that we treated last week also better. [Doxycycline] 07/28/17 better surface today with now 2 weeks of Iodoflex. Significant cellulitis treated with doxycycline. He has now completed the doxycycline and he is back to his usual degree of chronic venous inflammation/stasis dermatitis. He reminds me he has had ablations surgery here 08/04/17; continued improvement with Iodoflex to the left lateral leg wound in terms of the surface of the  wound although the dimensions are better. 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 Problematic areas on the plantar aspect of the first second toe web space on the left and the dorsal aspect of the first second toe web space on the right. At one point I felt these were probably related to chronic fungal infections in treated him aggressively for this although we have not made any improvement here. 08/11/17; left lateral leg. Surface continues to improve with the Iodoflex although we are not seeing much improvement in overall wound dimensions. Areas on his plantar left foot and right foot  show no improvement. In fact the right foot looks somewhat worse 08/18/17; left lateral leg. We changed to [facility] Blue last week after a prolonged course of Iodoflex which helps get the surface better. It appears that the wound with is improved. Continue with difficult areas on the left dorsal first second and plantar first second on the right 09/01/17; patient arrives in clinic today having had a temperature of 103 yesterday. He was seen in the ER and [facility]. The patient was concerned he could have cellulitis again in the right leg however they diagnosed him with a UTI and he is now on Keflex. He has a history of cellulitis which is been recurrent and difficult but this is been in the left leg, in the past 5 use doxycycline. He does in and out catheterizations at home which are risk factors for UTI 09/08/17; patient will be completing his Keflex this weekend. The erythema on the left leg is considerably better. He has a new wound today on the medial part of the right leg small superficial almost looks like a skin tear. He has worsening of the area on the right dorsal first and second toe. His major area on the left lateral leg is better. Using Hydrofera Blue on all areas 09/15/17; gradual reduction in width on the long wound in the left  lateral leg. No debridement required. He also has wounds on the plantar aspect of his left first second toe web space and on the dorsal aspect of the right first second toe web space. 09/22/17; there continues to be very gradual improvements in the dimensions of the left lateral leg wound. He hasn't round erythematous spot with might be pressure on his wheelchair. There is no evidence obviously of infection no purulence no warmth ooHe has a dry scaled area on the plantar aspect of the left first second toe ooImproved area on the dorsal right first second toe. 09/29/17; left lateral leg wound continues to improve in dimensions mostly with an is still a fairly long but increasingly narrow wound. ooHe has a dry scaled area on the plantar aspect of his left first second toe web space ooIncreasingly concerning area on the dorsal right first second toe. In fact I am concerned today about possible cellulitis around this wound. The areas extending up his second toe and although there is deformities here almost appears to abut on the nailbed. 10/06/17; left lateral leg wound continues to make very gradual progress. Tissue culture I did from the right first second toe dorsal foot last time grew MRSA and enterococcus which was vancomycin sensitive. This was not sensitive to clindamycin or doxycycline. He is allergic to Zyvox and sulfa we have therefore arrange for him to have dalvance infusion tomorrow. He is had this in the past and tolerated it well 10/20/17; left lateral leg wound continues to make decent progress. This is certainly reduced in terms of with there is advancing epithelialization.ooThe cellulitis in the right foot looks better although he still has a deep wound in the dorsal aspect of the first second toe web space. Plantar left first toe web space on the left I think is making some progress 10/27/17; left lateral leg wound continues to make decent progress. Advancing epithelialization.using  Hydrofera Blue ooThe right first second toe web space wound is better-looking using silver alginate ooImprovement in the left plantar first second toe web space. Again using silver alginate 11/03/17 left lateral leg wound continues to make decent progress albeit slowly. Using [facility] Blue ooThe  right per second toe web space continues to be a very problematic looking punched out wound. I obtained a piece of tissue for deep culture I did extensively treated this for fungus. It is difficult to imagine that this is a pressure area as the patient states other than going outside he doesn't really wear shoes at home ooThe left plantar first second toe web space looked fairly senescent. Necrotic edges. This required debridement oochange to Hydrofera Blue to all wound areas 11/10/17; left lateral leg wound continues to contract. Using Hydrofera Blue ooOn the right dorsal first second toe web space dorsally. Culture I did of this area last week grew MRSA there is not an easy oral option in this patient was multiple antibiotic allergies or intolerances. This was only a rare culture isolate I'm therefore going to use Bactroban under silver alginate ooOn the left plantar first second toe web space. Debridement is required here. This is also unchanged 11/17/17; left lateral leg wound continues to contract using Hydrofera Blue this is no longer the major issue. ooThe major concern here is the right first second toe web space. He now has an open area going from dorsally to the plantar aspect. There is now wound on the inner lateral part of the first toe. Not a very viable surface on this. There is erythema spreading medially into the forefoot. ooNo major change in the left first second toe plantar wound 11/24/17; left lateral leg wound continues to contract using Hydrofera Blue. Nice improvement today ooThe right first second toe web space all of this looks a lot less angry than last week. I have given  him clindamycin and topical Bactroban for MRSA and terbinafine for the possibility of underlining tinea pedis that I could not control with ketoconazole. Looks somewhat better ooThe area on the plantar left first second toe web space is weeping with dried debris around the wound 12/01/17; left lateral leg wound continues to contract he Hydrofera Blue. It is becoming thinner in terms of with nevertheless it is making good improvement. ooThe right first second toe web space looks less angry but still a large necrotic-looking wounds starting on the plantar aspect of the right foot extending between the toes and now extensively on the base of the right second toe. I gave him clindamycin and topical Bactroban for MRSA anterior benefiting for the possibility of underlying tinea pedis. Not looking better today ooThe area on the left first/second toe looks better. Debrided of necrotic debris 12/05/17* the patient was worked in urgently today because over the weekend he found blood on his incontinence bad when he woke up. He was found to have an ulcer by his wife who does most of his wound care. He came in today for [place] to look at this. He has not had a history of wounds in his buttocks in spite of his paraplegia. 12/08/17; seen in follow-up today at his usual appointment. He was seen earlier this week and found to have a new wound on his buttock. We also follow him for wounds on the left lateral leg, left first second toe web space and right first second toe web space 12/15/17; we have been using Hydrofera Blue to the left lateral leg which has improved. The right first second toe web space has also improved. Left first second toe web space plantar aspect looks stable. The left buttock has worsened using Santyl. Apparently the buttock has drainage 12/22/17; we have been using Hydrofera Blue to the left lateral leg which  continues to improve now 2 small wounds separated by normal skin. He tells [place] he had a fever  up to 100 yesterday he is prone to UTIs but has not noted anything different. He does in and out catheterizations. The area between the first and second toes today does not look good necrotic surface covered with what looks to be purulent drainage and erythema extending into the third toe. I had gotten this to something that I thought look better last time however it is not look good today. He also has a necrotic surface over the buttock wound which is expanded. I thought there might be infection under here so I removed a lot of the surface with a #5 curet though nothing look like it really needed culturing. He is been using Santyl to this area 12/27/17; his original wound on the left lateral leg continues to improve using Hydrofera Blue. I gave him samples of Baxdella although he was unable to take them out of fear for an allergic reaction ["lump in his throat"].the culture I did of the purulent drainage from his second toe last week showed both enterococcus and a set Enterobacter I was also concerned about the erythema on the bottom of his foot although paradoxically although this looks somewhat better today. 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 We've been using silver alginate here. They came up today that he sleeps in a recliner, I'm not sure why but I asked him to stop this 01/03/18; his original wound we've been using Hydrofera Blue is now separated into 2 areas. ooUlcer on his left buttock is better he is off the recliner and sleeping in bed ooFinally both wound areas between his first and second toes also looks some better 01/10/18; his original wound on the left lateral leg is now separated into 2 wounds we've been using Hydrofera Blue ooUlcer on his left buttock has some drainage. There is a small probing site going into muscle layer superiorly.using silver alginate -He arrives today with a  deep tissue injury on the left heel ooThe wound on the dorsal aspect of his first second toe on the left looks a lot betterusing silver alginate ketoconazole ooThe area on the first second toe web space on the right also looks a lot bette 01/17/18; his original wound on the left lateral leg continues to progress using Hydrofera Blue ooUlcer on his left buttock also is smaller surface healthier except for a small probing site going into the muscle layer superiorly. 2.4 cm of tunneling in this area ooDTI on his left heel we have only been offloading. Looks better than last week no threatened open no evidence of infection oothe wound on the dorsal aspect of the first second toe on the left continues to look like it's regressing we have only been using silver alginate and terbinafine orally ooThe area in the first second toe web space on the right also looks to be a lot better using silver alginate and terbinafine I think this was prompted by tinea pedis 01/31/18; the patient was hospitalized in [place] last week apparently for a complicated UTI. He was discharged on cefepime he does in and out catheterizations. In the hospital he was discovered M I don't mild elevation of AST and ALT and the terbinafine was stopped.predictably the pressure ulcer on s his buttock looks betterusing silver alginate. The area on the left lateral leg also is better using  Hydrofera Blue. The area between the first and second toes on the left better. First and second toes on the right still substantial but better. Finally the DTI on the left heel has held together and looks like it's resolving 02/07/18-he is here in follow-up evaluation for multiple ulcerations. He has new injury to the lateral aspect of the last issue a pressure ulcer, he states this is from adhesive removal trauma. He states he has tried multiple adhesive products with no success. All other ulcers appear stable. The left heel DTI is resolving. We will  continue with same treatment plan and follow-up next week. 02/14/18; follow-up for multiple areas. ooHe has a new area last week on the lateral aspect of his pressure ulcer more over the posterior trochanter. The original pressure ulcer looks quite stable has healthy granulation. 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 Almost fully epithelialized on the original superior area using Hydrofera Blue ooDTI on the left heel has peeled off this week to reveal a small superficial wound under denuded skin and subcutaneous tissue ooBoth areas between the first and second toes look better including nothing open on the left 02/21/18; ooThe patient's wounds on his left ischial tuberosity and posterior left greater trochanter actually looked better. He has a large area of irritation around the area which I think is contact dermatitis. I am doubtful that this is fungal ooHis original wound on the left lateral calf continues to improve we have been using Hydrofera Blue ooThere is no open area in the left first second toe web space although there is a lot of thick callus ooThe DTI on the left heel required debridement today of necrotic surface eschar and subcutaneous tissue using silver alginate ooFinally the area on the right first second toe webspace continues to contract using silver alginate and ketoconazole 02/28/18 ooLeft ischial tuberosity wounds look better using silver alginate. ooOriginal wound on the left calf only has one small open area left using Hydrofera Blue ooDTI on the left heel required debridement mostly removing skin from around this wound surface. Using silver alginate ooThe areas on the right first/second toe web space using silver alginate and ketoconazole 03/08/18 on evaluation today patient appears to be doing decently well as best I can tell in regard to his wounds. This  is the first time that I have seen him as he generally is followed by Dr. Dellia Nims. With that being said none of his wounds appear to be infected he does have an area where there is some skin covering what appears to be a new wound on the left dorsal surface of his great toe. This is right at the nail bed. With that being said I do believe that debrided away some of the excess skin can be of benefit in this regard. Otherwise he has been tolerating the dressing changes without complication. 03/14/18; patient arrives today with the multiplicity of wounds that we are following. He has not been systemically unwell ooOriginal wound on the left lateral calf now only has 2 small open areas we've been using Hydrofera Blue which should continue ooThe deep tissue injury on the left heel requires debridement today. We've been using silver alginate ooThe left first second toe and the right first second toe are both are reminiscence what I think was tinea pedis. Apparently some of the callus Surface between the toes was removed last week when it started draining. ooPurulent  drainage coming from the wound on the ischial tuberosity on the left. 03/21/18-He is here in follow-up evaluation for multiple wounds. 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 He tolerated debridement. The only change to last week's recommendations is to discontinue antifungal cream between toes. He will follow-up next week 03/28/18; following up for multiple wounds;Concern this week is streaking redness and swelling in the right foot. He is going to need antibiotics for this. 03/31/18; follow-up for right foot cellulitis. Streaking redness and swelling in the right foot on 03/28/18. 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 I put him on clindamycin 300 mg every 6 and brought him in for a quick check. He has an open wound between his first and second  toes on the right foot as a potential source. 04/04/18; ooRight foot cellulitis is resolving he is completing clindamycin. This is truly good news ooLeft lateral calf wound which is initial wound only has one small open area inferiorly this is close to healing out. He has compression stockings. We will use Hydrofera Blue right down to the epithelialization of this ooNonviable surface on the left heel which was initially pressure with a DTI. We've been using Hydrofera Blue. I'm going to switch this back to silver alginate ooLeft first second toe/tinea pedis this looks better using silver alginate ooRight first second toe tinea pedis using silver alginate ooLarge pressure ulcers on theLeft ischial tuberosity. Small wound here Looks better. I am uncertain about the surface over the large wound. Using silver alginate 04/11/18; ooCellulitis in the right foot is resolved ooLeft lateral calf wound which was his original wounds still has 2 tiny open areas remaining this is just about closed ooNonviable surface on the left heel is better but still requires debridement ooLeft first second toe/tinea pedis still open using silver alginate ooRight first second toe wound tinea pedis I asked him to go back to using ketoconazole and silver alginate ooLarge pressure ulcers on the left ischial tuberosity this shear injury here is resolved. Wound is smaller. No evidence of infection using silver alginate 04/18/18; ooPatient arrives with an intense area of cellulitis in the right mid lower calf extending into the right heel area. Bright red and warm. Smaller area on the left anterior leg. He has a significant history of MRSA. He will definitely need antibioticsoodoxycycline ooHe now has 2 open areas on the left ischial tuberosity the original large wound and now a satellite area which I think was above his initial satellite areas. Not a wonderful surface on this satellite area surrounding erythema  which looks like pressure related. ooHis left lateral calf wound again his original wound is just about closed ooLeft heel pressure injury still requiring debridement ooLeft first second toe looks a lot better using silver alginate ooRight first second toe also using silver alginate and ketoconazole cream also looks better 04/20/18; the patient was worked in early today out of concerns with his cellulitis on the right leg. I had started him on doxycycline. This was 2 days ago. His wife was concerned about the swelling in the area. Also concerned about the left buttock. He has not been systemically unwell no fever chills. No nausea vomiting or diarrhea 04/25/18; the patient's left buttock wound is continued to deteriorate he is using Hydrofera Blue. He is still completing clindamycin for the cellulitis on the right leg although all of this looks better. 05/02/18 ooLeft buttock wound still with a lot of  drainage and a very tightly adherent fibrinous necrotic surface. He has a deeper area superiorly ooThe left lateral calf wound is still closed ooDTI wound on the left heel necrotic surface especially the circumference using Iodoflex ooAreas between his left first second toe and right first second toe both look better. Dorsally and the right first second toe he had a necrotic surface although at smaller. In using silver alginate and ketoconazole. I did a culture last week which was a deep tissue culture of the reminiscence of the open wound on the right first second toe dorsally. This grew a few Acinetobacter and a few methicillin-resistant staph aureus. Nevertheless the area actually this week looked better. I didn't feel the need to specifically address this at least in terms of systemic antibiotics. 05/09/18; wounds are measuring larger more drainage per our intake. We are using Santyl covered with alginate on the large superficial buttock wounds, Iodosorb on the left heel, ketoconazole and  silver alginate to the dorsal first and second toes bilaterally. 05/16/18; ooThe area on his left buttock better in some aspects although the area superiorly over the ischial tuberosity required an extensive debridement.using Santyl ooLeft heel appears stable. Using Iodoflex ooThe areas between his first and second toes are not bad however there is spreading erythema up the dorsal aspect of his left foot this looks like cellulitis again. He is insensate the erythema is really very brilliant.o Erysipelas He went to see an allergist days ago because he was itching part of this he had lab work done. This showed a white count of 15.1 with 70% neutrophils. Hemoglobin of 11.4 and a platelet count of 659,000. Last white count we had in Epic was a 2-1/2 years ago which was 25.9 but he was ill at the time. He was able to show me some lab work that was done by his primary physician the pattern is about the same. 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 But prompted me to go ahead and do x-rays of both feet and the pelvis rule out osteomyelitis. He also had a comprehensive metabolic panel this was reasonably normal his albumin was 3.7 liver function tests BUN/creatinine all normal 05/23/18; x-rays of both his feet from last week were negative for underlying pulmonary abnormality. The x-ray of his pelvis however showed mild irregularity in the left ischial which may represent some early osteomyelitis. The wound in the left ischial continues to get deeper clearly now exposed muscle. Each week necrotic surface material over this area. Whereas the rest of the wounds do not look so bad. ooThe left ischial wound we have been using Santyl and calcium alginate ooT the left heel surface necrotic debris using Iodoflex o ooThe left lateral leg is still healed ooAreas on the left dorsal foot and the right dorsal foot are about the same. There is some inflammation on the  left which might represent contact dermatitis, fungal dermatitis I am doubtful cellulitis although this looks better than last week 05/30/18; CT scan done at Hospital did not show any osteomyelitis or abscess. Suggested the possibility of underlying cellulitis although I don't see a lot of evidence of this at the bedside ooThe wound itself on the left buttock/upper thigh actually looks somewhat better. No debridement ooLeft heel also looks better no debridement continue Iodoflex ooBoth dorsal first second toe spaces appear better using Lotrisone. Left still required debridement 06/06/18; ooIntake reported some purulent looking drainage from the left gluteal wound. Using  Santyl and calcium alginate ooLeft heel looks better although still a nonviable surface requiring debridement ooThe left dorsal foot first/second webspace actually expanding and somewhat deeper. I may consider doing a shave biopsy of this area ooRight dorsal foot first/second webspace appears stable to improved. Using Lotrisone and silver alginate to both these areas 06/13/18 ooLeft gluteal surface looks better. Now separated in the 2 wounds. No debridement required. Still drainage. We'll continue silver alginate ooLeft heel continues to look better with Iodoflex continue this for at least another week ooOf his dorsal foot wounds the area on the left still has some depth although it looks better than last week. We've been using Lotrisone and silver alginate 06/20/18 ooLeft gluteal continues to look better healthy tissue ooLeft heel continues to look better healthy granulation wound is smaller. He is using Iodoflex and his long as this continues continue the Iodoflex ooDorsal right foot looks better unfortunately dorsal left foot does not. There is swelling and erythema of his forefoot. He had minor trauma to this several days ago but doesn't think this was enough to have caused any tissue injury. Foot looks like  cellulitis, we have had this problem before 06/27/18 on evaluation today patient appears to be doing a little worse in regard to his foot ulcer. 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 Both of which would really be his only options for treating this infection. In the past he has been given and effusion of Orbactiv. This is done very well for him in the past again it's one time dosing IV antibiotic therapy. Subsequently I do believe this is something we're gonna need to see about doing at this point in time. Currently his other wounds seem to be doing somewhat better in my pinion I'm pretty happy in that regard. 07/03/18 on evaluation today patient's wounds actually appear to be doing fairly well. He has been tolerating the dressing changes without complication. All in all he seems to be showing signs of improvement. In regard to the antibiotics he has been dealing with infectious disease since I saw him last week as far as getting this scheduled. In the end [facility]'s [facility] this coming Friday. In the meantime he has been continuing to perform the dressing changes in such as previous. There does not appear to be any evidence of infection worsengin at this time. 07/10/18; ooSince I last saw this man 2 weeks ago things have actually improved. IV antibiotics of resulted in less forefoot erythema although there is still some present. He is not systemically unwell ooLeft buttock wounds o2 now have no depth there is increased epithelialization Using silver alginate ooLeft heel still requires debridement using Iodoflex ooLeft dorsal foot still with a sizable wound about the size of a border but healthy granulation ooRight dorsal foot still with a slitlike area using silver alginate 07/18/18; the patient's  cellulitis in the left foot is improved in fact I think it is on its way to resolving. ooLeft buttock wounds o2 both look better although the larger one has hypertension granulation we've been using silver alginate ooLeft heel has some thick circumferential redundant skin over the wound edge which will need to be removed today we've been using Iodoflex ooLeft dorsal foot is still a sizable wound required debridement using silver alginate ooThe  right dorsal foot is just about closed only a small open area remains here 07/25/18; left foot cellulitis is resolved ooLeft buttock wounds o2 both look better. Hyper-granulation on the major area ooLeft heel as some debris over the surface but otherwise looks a healthier wound. Using silver collagen ooRight dorsal foot is just about closed 07/31/18; arrives with our intake nurse worried about purulent drainage from the buttock. We had hyper-granulation here last week ooHis buttock wounds o2 continue to look better ooLeft heel some debris over the surface but measuring smaller. ooRight dorsal foot unfortunately has openings between the toes ooLeft foot superficial wound looks less aggravated. 08/07/18 ooButtock wounds continue to look better although some of her granulation and the larger medial wound. silver alginate ooLeft heel continues to look a lot better.silver collagen ooLeft foot superficial wound looks less stable. Requires debridement. He has a new wound superficial area on the foot on the lateral dorsal foot. ooRight foot looks better using silver alginate without Lotrisone 08/14/2018; patient was in the ER last week diagnosed with a UTI. He is now on Cefpodoxime and Macrodantin. ooButtock wounds continued to be smaller. Using silver alginate ooLeft heel continues to look better using silver collagen ooLeft foot superficial wound looks as though it is improving ooRight dorsal foot area is just about healed. 08/21/2018; patient  is completed his antibiotics for his UTI. ooHe has 2 open areas on the buttocks. There is still not closed although the surface looks satisfactory. Using silver alginate ooLeft heel continues to improve using silver collagen ooThe bilateral dorsal foot areas which are at the base of his first and second toes/possible tinea pedis are actually stable on the left but worse on the right. The area on the left required debridement of necrotic surface. After debridement I obtained a specimen for PCR culture. ooThe right dorsal foot which is been just about healed last week is now reopened 08/28/2018; culture done on the left dorsal foot showed coag negative staph both staph epidermidis and Lugdunensis. I think this is worthwhile initiating systemic treatment. I will use doxycycline given his long list of allergies. The area on the left heel slightly improved but still requiring debridement. ooThe large wound on the buttock is just about closed whereas the smaller one is larger. Using silver alginate in this area 09/04/2018; patient is completing his doxycycline for the left foot although this continues to be a very difficult wound area with very adherent necrotic debris. We are using silver alginate to all his wounds right foot left foot and the small wounds on his buttock, silver collagen on the left heel. 09/11/2018; once again this patient has intense erythema and swelling of the left forefoot. Lesser degrees of erythema in the right foot. He has a long list of allergies and intolerances. I will reinstitute doxycycline. 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 Using silver alginate ooLeft heel also looks better using silver collagen ooUnfortunately both the areas on his feet look worse. The area on the left first second webspace is now gone through to the plantar part of his foot. The area on the left foot anteriorly is irritated with erythema  and swelling in the forefoot. 09/25/2018 ooHis wound on the left plantar heel looks better. Using silver collagen ooThe area on the left buttock 2 small remnant areas. One is closed one is still open. Using silver alginate ooThe areas between both his first and second toes look worse.  This in spite of long-standing antifungal therapy with ketoconazole and silver alginate which should have antifungal activity ooHe has small areas around his original wound on the left calf one is on the bottom of the original scar tissue and one superiorly both of these are small and superficial but again given wound history in this site this is worrisome 10/02/2018 ooLeft plantar heel continues to gradually contract using silver collagen ooLeft buttock wound is unchanged using silver alginate ooThe areas on his dorsal feet between his first and second toes bilaterally look about the same. 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 Small open area here. We have been using silver alginate 10/09/2018; ooLeft plantar heel continues to look healthy and contract. No debridement is required ooLeft buttock slightly smaller with a tape injury wound just below which was new this week ooDorsal feet somewhat improved I have been using clindamycin ooLeft lateral looks lower extremity the actual open area looks worse although a lot of this is epithelialized. I am going to change to silver collagen today He has a lot more swelling in the right leg although this is not pitting not red and not particularly warm there is a lot of spasm in the right leg usually indicative of people with paralysis of some underlying discomfort. We have reviewed his vascular status from 2017 he had a left greater saphenous vein ablation. I wonder about referring  him back to vascular surgery if the area on the left leg continues to deteriorate. 10/16/2018 in today for follow-up and management of multiple lower extremity ulcers. His left Buttock wound is much lower smaller and almost closed completely. The wound to the left ankle has began to reopen with Epithelialization and some adherent slough. He has multiple new areas to the left foot and leg. The left dorsal foot without much improvement. Wound present between left great webspace and 2nd toe. Erythema and edema present right leg. Right LE ultrasound obtained on 10/10/18 was negative for DVT . 10/23/2018; ooLeft buttock is closed over. Still dry macerated skin but there is no open wound. I suspect this is chronic pressure/moisture ooLeft lateral calf is quite a bit worse than when I saw this last. There is clearly drainage here he has macerated skin into the left plantar heel. We will change the primary dressing to alginate ooLeft dorsal foot has some improvement in overall wound area. Still using clindamycin and silver alginate ooRight dorsal foot about the same as the left using clindamycin and silver alginate ooThe erythema in the right leg has resolved. He is DVT rule out was negative ooLeft heel pressure area required debridement although the wound is smaller and the surface is health 10/26/2018 ooThe patient came back in for his nurse check today predominantly because of the drainage coming out of the left lateral leg with a recent reopening of his original wound on the left lateral calf. He comes in today with a large amount of surrounding erythema around the wound extending from the calf into the ankle and even in the area on the dorsal foot. He is not systemically unwell. He is not febrile. Nevertheless this looks like cellulitis. We have been using silver alginate to the area. I changed him to a regular visit and I am going to prescribe him doxycycline. The rationale here is a long list  of  medication intolerances and a history of MRSA. I did not see anything that I thought would provide a valuable culture 10/30/2018 ooFollow-up from his appointment 4 days ago with really an extensive area of cellulitis in the left calf left lateral ankle and left dorsal foot. I put him on doxycycline. He has a long list of medication allergies which are true allergy reactions. 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 In any case he is a lot better today. The erythema especially in the anterior and lateral left calf is better. He still has left ankle erythema. He also is complaining about increasing edema in the right leg we have only been using Kerlix Coban and he has been doing the wraps at home. Finally he has a spotty rash on the medial part of his upper left calf which looks like folliculitis or perhaps wrap occlusion type injury. Small superficial macules not pustules 11/06/18 patient arrives today with again a considerable degree of erythema around the wound on the left lateral calf extending into the dorsal ankle and dorsal foot. This is a lot worse than when I saw this last week. He is on doxycycline really with not a lot of improvement. He has not been systemically unwell Wounds on the; left heel actually looks improved. Original area on the left foot and proximity to the first and second toes looks about the same. He has superficial areas on the dorsal foot, anterior calf and then the reopening of his original wound on the left lateral calf which looks about the same ooThe only area he has on the right is the dorsal webspace first and second which is smaller. ooHe has a large area of dry erythematous skin on the left buttock small open area here. 11/13/2018; the patient arrives in much better condition. The erythema around the wound on the left lateral calf is a lot better. Not sure whether this was the clindamycin or  the TCA and ketoconazole or just in the improvement in edema control [stasis dermatitis]. In any case this is a lot better. The area on the left heel is very small and just about resolved using silver collagen we have been using silver alginate to the areas on his dorsal feet 11/20/2018; his wounds include the left lateral calf, left heel, dorsal aspects of both feet just proximal to the first second webspace. He is stable to slightly improved. I did not think any changes to his dressings were going to be necessary 11/27/2018 he has a reopening on the left buttock which is surrounded by what looks like tinea or perhaps some other form of dermatitis. The area on the left dorsal foot has some erythema around it I have marked this area but I am not sure whether this is cellulitis or not. Left heel is not closed. Left calf the reopening is really slightly longer and probably worse 1/13; in general things look better and smaller except for the left dorsal foot. Area on the left heel is just about closed, left buttock looks better only a small wound remains in the skin looks better [using Lotrisone] 1/20; the area on the left heel only has a few remaining open areas here. Left lateral calf about the same in terms of size, left dorsal foot slightly larger right lateral foot still not closed. The area on the left buttock has no open wound and the surrounding skin looks a lot better 1/27; the area on  the left heel is closed. Left lateral calf better but still requiring extensive debridements. The area on his left buttock is closed. He still has the open areas on the left dorsal foot which is slightly smaller in the right foot which is slightly expanded. We have been using Iodoflex on these areas as well 2/3; left heel is closed. Left lateral calf still requiring debridement using Iodoflex there is no open area on his left buttock however he has dry scaly skin over a large area of this. Not really responding well  to the Lotrisone. Finally the areas on his dorsal feet at the level of the first second webspace are slightly smaller on the right and about the same on the left. Both of these vigorously debrided with Anasept and gauze 2/10; left heel remains closed he has dry erythematous skin over the left buttock but there is no open wound here. Left lateral leg has come in and with. Still requiring debridement we have been using Iodoflex here. Finally the area on the left dorsal foot and right dorsal foot are really about the same extremely dry callused fissured areas. He does not yet have a dermatology appointment 2/17; left heel remains closed. He has a new open area on the left buttock. The area on the left lateral calf is bigger longer and still covered in necrotic debris. No major change in his foot areas bilaterally. I am awaiting for a dermatologist to look on this. We have been using ketoconazole I do not know that this is been doing any good at all. 2/24; left heel remains closed. The left buttock wound that was new reopening last week looks better. The left lateral calf appears better also although still requires debridement. The major area on his foot is the left first second also requiring debridement. We have been putting Prisma on all wounds. I do not believe that the ketoconazole has done too much good for his feet. He will use Lotrisone I am going to give him a 2-week course of terbinafine. We still do not have a dermatology appointment 3/2 left heel remains closed however there is skin over bone in this area I pointed this out to him today. The left buttock wound is epithelialized but still does not look completely stable. The area on the left leg required debridement were using silver collagen here. With regards to his feet we changed to Lotrisone last week and silver alginate. 3/9; left heel remains closed. Left buttock remains closed. The area on the right foot is essentially closed. The left  foot remains unchanged. Slightly smaller on the left lateral calf. Using silver collagen to both of these areas 3/16-Left heel remains closed. Area on right foot is closed. Left lateral calf above the lateral malleolus open wound requiring debridement with easy bleeding. Left dorsal wound proximal to first toe also debrided. Left ischial area open new. Patient has been using Prisma with wrapping every 3 days. Dermatology appointment is apparently tomorrow.Patient has completed his terbinafine 2-week course with some apparent improvement according to him, there is still flaking and dry skin in his foot on the left 3/23; area on the right foot is reopened. The area on the left anterior foot is about the same still a very necrotic adherent surface. He still has the area on the left leg and reopening is on the left buttock. He apparently saw dermatology although I do not have a note. According to the patient who is usually fairly well informed they did  not have any good ideas. Put him on oral terbinafine which she is been on before. 3/30; using silver collagen to all wounds. Apparently his dermatologist put him on doxycycline and rifampin presumably some culture grew staph. I do not have this result. He remains on terbinafine although I have used terbinafine on him before 4/6; patient has had a fairly substantial reopening on the right foot between the first and second toes. He is finished his terbinafine and I believe is on doxycycline and rifampin still as prescribed by dermatology. We have been using silver collagen to all his wounds although the patient reports that he thinks silver alginate does better on the wounds on his buttock. 4/13; the area on his left lateral calf about the same size but it did not require debridement. ooLeft dorsal foot just proximal to the webspace between the first and second toes is about the same. Still nonviable surface. I note some superficial bronze discoloration of  the dorsal part of his foot ooRight dorsal foot just proximal to the first and second toes also looks about the same. I still think there may be the same discoloration I noted above on the left ooLeft buttock wound looks about the same 4/20; left lateral calf appears to be gradually contracting using silver collagen. 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 The left dorsal foot wound is debrided of tightly adherent necrotic debris and really cleans up quite nicely. The right area is worse with expansion. I did not debride this it is now over the base of the second toe ooThe area on his left buttock is smaller no debridement is required using silver collagen 5/4; left calf continues to make good progress. ooHe arrives with erythema around the wounds on his dorsal foot which even extends to the plantar aspect. Very concerning for coexistent infection. He is finished the erythromycin I gave him for possible erythrasma this does not seem to have helped. ooThe area on the left foot is about the same base of the dorsal toes ooIs area on the buttock looks improved on the left 5/11; left calf and left buttock continued to make good progress. Left foot is about the same to slightly improved. ooMajor problem is on the right foot. He has not had an x-ray. Deep tissue culture I did last week showed both Enterobacter and E. coli. I did not change the doxycycline I put him on empirically although neither 1 of these were plated to doxycycline. He arrives today with the erythema looking worse on both the dorsal and plantar foot. Macerated skin on the bottom of the foot. he has not been systemically unwell 5/18-Patient returns at 1 week, left calf wound appears to be making some progress, left buttock wound appears slightly worse than last time, left foot wound looks slightly better, right foot redness is marginally better. X-ray of both feet show  no air or evidence of osteomyelitis. Patient is finished his Omnicef and terbinafine. He continues to have macerated skin on the bottom of the left foot as well as right 5/26; left calf wound is better, left buttock wound appears to have multiple small superficial open areas with surrounding macerated skin. X-rays that I did last time showed no evidence of osteomyelitis in either foot. He is finished cefdinir and doxycycline. I do not think that he was on terbinafine. He continues to have a large superficial open area on the right foot anterior dorsal and slightly between the first and  second toes. I did send him to dermatology 2 months ago or so wondering about whether they would do a fungal scraping. I do not believe they did but did do a culture. We have been using silver alginate to the toe areas, he has been using antifungals at home topically either ketoconazole or Lotrisone. We are using silver collagen on the left foot, silver alginate on the right, silver collagen on the left lateral leg and silver alginate on the left buttock 6/1; left buttock area is healed. We have the left dorsal foot, left lateral leg and right dorsal foot. We are using silver alginate to the areas on both feet and silver collagen to the area on his left lateral calf 6/8; the left buttock apparently reopened late last week. He is not really sure how this happened. He is tolerating the terbinafine. Using silver alginate to all wounds 6/15; left buttock wound is larger than last week but still superficial. ooCame in the clinic today with a report of purulence from the left lateral leg I did not identify any infection ooBoth areas on his dorsal feet appear to be better. He is tolerating the terbinafine. Using silver alginate to all wounds 6/22; left buttock is about the same this week, left calf quite a bit better. His left foot is about the same however he comes in with erythema and warmth in the right forefoot once  again. Culture that I gave him in the beginning of May showed Enterobacter and E. coli. I gave him doxycycline and things seem to improve although neither 1 of these organisms was specifically plated. 6/29; left buttock is larger and dry this week. Left lateral calf looks to me to be improved. Left dorsal foot also somewhat improved right foot completely unchanged. The erythema on the right foot is still present. He is completing the Ceftin dinner that I gave him empirically [see discussion above.) 7/6 - All wounds look to be stable and perhaps improved, the left buttock wound is slightly smaller, per patient bleeds easily, completed ceftin, the right foot redness is less, he is on terbinafine 7/13; left buttock wound about the same perhaps slightly narrower. Area on the left lateral leg continues to narrow. Left dorsal foot slightly smaller right foot about the same. We are using silver alginate on the right foot and Hydrofera Blue to the areas on the left. Unna boot on the left 2 layer compression on the right 7/20; left buttock wound absolutely the same. Area on lateral leg continues to get better. Left dorsal foot require debridement as did the right no major change in the 7/27; left buttock wound the same size necrotic debris over the surface. The area on the lateral leg is closed once again. His left foot looks better right foot about the same although there is some involvement now of the posterior first second toe area. He is still on terbinafine which I have given him for a month, not certain a centimeter major change 06/25/19-All wounds appear to be slightly improved according to report, left buttock wound looks clean, both foot wounds have minimal to no debris the right dorsal foot has minimal slough. We are using Hydrofera Blue to the left and silver alginate to the right foot and ischial wound. 8/10-Wounds all appear to be around the same, the right forefoot distal part has some redness  which was not there before, however the wound looks clean and small. Ischial wound looks about the same with no changes 8/17; his  wound on the left lateral calf which was his original chronic venous insufficiency wound remains closed. Since I last saw him the areas on the left dorsal foot right dorsal foot generally appear better but require debridement. The area on his left initial tuberosity appears somewhat larger to me perhaps hyper granulated and bleeds very easily. We have been using Hydrofera Blue to the left dorsal foot and silver alginate to everything else 8/24; left lateral calf remains closed. The areas on his dorsal feet on the webspace of the first and second toes bilaterally both look better. The area on the left buttock which is the pressure ulcer stage II slightly smaller. I change the dressing to Hydrofera Blue to all areas 8/31; left lateral calf remains closed. The area on his dorsal feet bilaterally look better. Using Hydrofera Blue. Still requiring debridement on the left foot. No change in the left buttock pressure ulcers however 9/14; left lateral calf remains closed. Dorsal feet look quite a bit better than 2 weeks ago. Flaking dry skin also a lot better with the ammonium lactate I gave him 2 weeks ago. The area on the left buttock is improved. He states that his Roho cushion developed a leak and he is getting a new one, in the interim he is offloading this vigorously 9/21; left calf remains closed. Left heel which was a possible DTI looks better this week. He had macerated tissue around the left dorsal foot right foot looks satisfactory and improved left buttock wound. I changed his dressings to his feet to silver alginate bilaterally. Continuing Hydrofera Blue on the left buttock. 9/28 left calf remains closed. Left heel did not develop anything [possible DTI] dry flaking skin on the left dorsal foot. Right foot looks satisfactory. Improved left buttock wound. We are using  silver alginate on his feet Hydrofera Blue on the buttock. I have asked him to go back to the Lotrisone on his feet including the wounds and surrounding areas 10/5; left calf remains closed. The areas on the left and right feet about the same. A lot of this is epithelialized however debris over the remaining open areas. He is using Lotrisone and silver alginate. The area on the left buttock using Hydrofera Blue 10/26. Patient has been out for 3 weeks secondary to Covid concerns. He tested negative but I think his wife tested positive. He comes in today with the left foot substantially worse, right foot about the same. Even more concerning he states that the area on his left buttock closed over but then reopened and is considerably deeper in one aspect than it was before [stage III wound] 11/2; left foot really about the same as last week. Quarter sized wound on the dorsal foot just proximal to the first second toes. Surrounding erythema with areas of denuded epithelium. This is not really much different looking. Did not look like cellulitis this time however. ooRight foot area about the same.. We have been using silver alginate alginate on his toes ooLeft buttock still substantial irritated skin around the wound which I think looks somewhat better. We have been using Hydrofera Blue here. 11/9; left foot larger than last week and a very necrotic surface. Right foot I think is about the same perhaps slightly smaller. Debris around the circumference also addressed. Unfortunately on the left buttock there is been a decline. Satellite lesions below the major wound distally and now a an additional one posteriorly we have been using Hydrofera Blue but I think this is a pressure  issue 11/16; left foot ulcer dorsally again a very adherent necrotic surface. Right foot is about the same. Not much change in the pressure ulcer on his left buttock. 11/30; left foot ulcer dorsally basically the same as when I saw  him 2 weeks ago. Very adherent fibrinous debris on the wound surface. Patient reports a lot of drainage as well. 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 We have been using Iodoflex, patient changed back to alginate because of the drainage. Area on his right dorsal foot really looks benign with a healthier surface certainly a lot better than on the left. Left buttock wounds all improved using Hydrofera Blue 12/7; left dorsal foot again no improvement. Tightly adherent debris. PCR culture I did last week only showed likely skin contaminant. I have gone ahead and done a punch biopsy of this which is about the last thing in terms of investigations I can think to do. He has known venous insufficiency and venous hypertension and this could be the issue here. The area on the right foot is about the same left buttock slightly worse according to our intake nurse secondary to [facility] Blue sticking to the wound 12/14; biopsy of the left foot that I did last time showed changes that could be related to wound healing/chronic stasis dermatitis phenomenon no neoplasm. We have been using silver alginate to both feet. I change the one on the left today to Sorbact and silver alginate to his other 2 wounds 12/28; the patient arrives with the following problems; ooMajor issue is the dorsal left foot which continues to be a larger deeper wound area. Still with a completely nonviable surface ooParadoxically the area mirror image on the right on the right dorsal foot appears to be getting better. ooHe had some loss of dry denuded skin from the lower part of his original wound on the left lateral calf. Some of this area looked a little vulnerable and for this reason we put him in wrap that on this side this week ooThe area on his left buttock is larger. He still has the erythematous circular area which I think is a combination of pressure, sweat. This does not look  like cellulitis or fungal dermatitis 11/26/2019; -Dorsal left foot large open wound with depth. Still debris over the surface. Using Sorbact ooThe area on the dorsal right foot paradoxically has closed over [employer] has a reopening on the left ankle laterally at the base of his original wound that extended up into the calf. This appears clean. ooThe left buttock wound is smaller but with very adherent necrotic debris over the surface. We have been using silver alginate here as well The patient had arterial studies done in 2017. He had biphasic waveforms at the dorsalis pedis and posterior tibial bilaterally. ABI in the left was 1.17. Digit waveforms were dampened. He has slight spasticity in the great toes I do not think a TBI would be possible 1/11; the patient comes in today with a sizable reopening between the first and second toes on the right. This is not exactly in the same location where we have been treating wounds previously. According to our intake nurse this was actually fairly deep but 0.6 cm. The area on the left dorsal foot looks about the same the surface is somewhat cleaner using Sorbact, his MRI is in 2 days. We have not managed yet to get arterial studies. The new reopening on the left lateral calf looks somewhat better  using alginate. The left buttock wound is about the same using alginate 1/18; the patient had his ARTERIAL studies which were quite normal. ABI in the right at 1.13 with triphasic/biphasic waveforms on the left ABI 1.06 again with triphasic/biphasic waveforms. It would not have been possible to have done a toe brachial index because of spasticity. We have been using Sorbac to the left foot alginate to the rest of his wounds on the right foot left lateral calf and left buttock 1/25; arrives in clinic with erythema and swelling of the left forefoot worse over the first MTP area. This extends laterally dorsally and but also posteriorly. Still has an area on the left  lateral part of the lower part of his calf wound it is eschared and clearly not closed. ooArea on the left buttock still with surrounding irritation and erythema. ooRight foot surface wound dorsally. The area between the right and first and second toes appears better. 2/1; ooThe left foot wound is about the same. Erythema slightly better I gave him a week of doxycycline empirically ooRight foot wound is more extensive extending between the toes to the plantar surface ooLeft lateral calf really no open surface on the inferior part of his original wound however the entire area still looks vulnerable ooAbsolutely no improvement in the left buttock wound required debridement. 2/8; the left foot is about the same. Erythema is slightly improved I gave him clindamycin last week. ooRight foot looks better he is using Lotrimin and silver alginate ooHe has a breakdown in the left lateral calf. Denuded epithelium which I have removed ooLeft buttock about the same were using Hydrofera Blue 2/15; left foot is about the same there is less surrounding erythema. Surface still has tightly adherent debris which I have debriding however not making any progress ooRight foot has a substantial wound on the medial right second toe between the first and second webspace. ooStill an open area on the left lateral calf distal area. ooButtock wound is about the same 2/22; left foot is about the same less surrounding erythema. Surface has adherent debris. Polymen Ag Right foot area significant wound between the first and second toes. We have been using silver alginate here Left lateral leg polymen Ag at the base of his original venous insufficiency wound ooLeft buttock some improvement here 3/1; ooRight foot is deteriorating in the first second toe webspace. Larger and more substantial. We have been using silver alginate. ooLeft dorsal foot about the same markedly adherent surface debris using PolyMem  Ag ooLeft lateral calf surface debris using PolyMem AG ooLeft buttock is improved again using PolyMem Ag. ooHe is completing his terbinafine. The erythema in the foot seems better. He has been on this for 2 weeks 3/8; no improvement in any wound area in fact he has a small open area on the dorsal midfoot which is new this week. He has not gotten his foot x-rays yet 3/15; his x-rays were both negative for osteomyelitis of both feet. No major change in any of his wounds on the extremities however his buttock wounds are better. We have been using polymen on the buttocks, left lower leg. Iodoflex on the left foot and silver alginate on the right 3/22; arrives in clinic today with the 2 major issues are the improvement in the left dorsal foot wound which for once actually looks healthy with a nice healthy wound surface without debridement. Using Iodoflex here. Unfortunately on the left lateral calf which is in the distal part of his original  wound he came to the clinic here for there was purulent drainage noted some increased breakdown scattered around the original area and a small area proximally. We we are using polymen here will change to silver alginate today. His buttock wound on the left is better and I think the area on the right first second toe webspace is also improved 3/29; left dorsal foot looks better. Using Iodoflex. Left ankle culture from deterioration last time grew E. coli, Enterobacter and Enterococcus. I will give him a course of cefdinir although that will not cover Enterococcus. The area on the right foot in the webspace of the first and second toe lateral first toe looks better. The area on his buttock is about healed Vascular appointment is on April 21. This is to look at his venous system vis--vis continued breakdown of the wounds on the left including the left lateral leg and left dorsal foot he. He has had previous ablations on this side 4/5; the area between the right  first and second toes lateral aspect of the first toe looks better. Dorsal aspect of the left first toe on the left foot also improved. Unfortunately the left lateral lower leg is larger and there is a second satellite wound superiorly. The usual superficial abrasions on the left buttock overall better but certainly not closed 4/12; the area between the right first and second toes is improved. Dorsal aspect of the left foot also slightly smaller with a vibrant healthy looking surface. No real change in the left lateral leg and the left buttock wound is healed He has an unaffordable co-pay for [employer]. Appointment with vein and vascular with regards to the left leg venous part of the circulation is on 4/21 4/19; we continue to see improvement in all wound areas. Although this is minor. He has his vascular appointment on 4/21. The area on the left buttock has not reopened although right in the center of this area the skin looks somewhat threatened 4/26; the left buttock is unfortunately reopened. In general his left dorsal foot has a healthy surface and looks somewhat smaller although it was not measured as such. The area between his first and second toe webspace on the right as a small wound against the first toe. The patient saw vascular surgery. The real question I was asking was about the small saphenous vein on the left. He has previously ablated left greater saphenous vein. Nothing further was commented on on the left. Right greater saphenous vein without reflux at the saphenofemoral junction or proximal thigh there was no indication for ablation of the right greater saphenous vein duplex was negative for DVT bilaterally. They did not think there was anything from a vascular surgery point of view that could be offered. They ABIs within normal limits 5/3; only small open area on the left buttock. The area on the left lateral leg which was his original venous reflux is now 2 wounds both which look  clean. We are using Iodoflex on the left dorsal foot which looks healthy and smaller. He is down to a very tiny area between the right first and second toes, using silver alginate 5/10; all of his wounds appear better. We have much better edema control in 4 layer compression on the left. This may be the factor that is allowing the left foot and left lateral calf to heal. He has external compression garments at home 04/14/20-All of his wounds are progressing well, the left forefoot is practically closed, left ischium appears to  be about the same, right toe webspace is also smaller. The left lateral leg is about the same, continue using Hydrofera Blue to this, silver alginate to the ischium, Iodoflex to the toe space on the right 6/7; most of his wounds outside of the left buttock are doing well. The area on the left lateral calf and left dorsal foot are smaller. The area on the right foot in between the first and second toe webspace is barely visible although he still says there is some drainage here is the only reason I did not heal this out. ooUnfortunately the area on the left buttock almost looks like he has a skin tear from tape. He has open wound and then a large flap of skin that we are trying to get adherence over an area just next to the remaining wound 6/21; 2 week follow-up. I believe is been here for nurse visits. Miraculously the area between his first and second toes on the left dorsal foot is closed over. Still open on the right first second web space. The left lateral calf has 2 open areas. Distally this is more superficial. The proximal area had a little more depth and required debridement of adherent necrotic material. His buttock wound is actually larger we have been using silver alginate here Objective Constitutional Vitals Time Taken: 8:00 AM, Height: 70 in, Weight: 216 lbs, BMI: 31, Temperature: 98.6 F, Pulse: 109 bpm, Respiratory Rate: 18 breaths/min, Blood Pressure: 139/65  mmHg. Integumentary (Hair, Skin) Wound #24 status is Healed - Epithelialized. Original cause of wound was Gradually Appeared. The wound is located on the Left,Dorsal Foot. The wound measures 0cm length x 0cm width x 0cm depth; 0cm^2 area and 0cm^3 volume. There is no tunneling or undermining noted. There is a none present amount of drainage noted. The wound margin is flat and intact. There is no granulation within the wound bed. There is no necrotic tissue within the wound bed. Wound #37 status is Open. Original cause of wound was Gradually Appeared. The wound is located on the Left,Lateral Malleolus. The wound measures 8cm length x 1cm width x 0.2cm depth; 6.283cm^2 area and 1.257cm^3 volume. There is Fat Layer (Subcutaneous Tissue) Exposed exposed. There is no tunneling or undermining noted. There is a medium amount of serosanguineous drainage noted. The wound margin is flat and intact. There is large (67-100%) red granulation within the wound bed. There is a small (1-33%) amount of necrotic tissue within the wound bed. Wound #38 status is Open. Original cause of wound was Gradually Appeared. The wound is located on the Right T - Web between 1st and 2nd. The wound oe measures 1cm length x 0.2cm width x 0.4cm depth; 0.157cm^2 area and 0.063cm^3 volume. There is Fat Layer (Subcutaneous Tissue) Exposed exposed. There is no tunneling or undermining noted. There is a medium amount of serosanguineous drainage noted. The wound margin is distinct with the outline attached to the wound base. There is large (67-100%) red granulation within the wound bed. There is a small (1-33%) amount of necrotic tissue within the wound bed including Adherent Slough. Wound #41 status is Open. Original cause of wound was Gradually Appeared. The wound is located on the Left Ischium. The wound measures 4.7cm length x 2cm width x 0.2cm depth; 7.383cm^2 area and 1.477cm^3 volume. There is Fat Layer (Subcutaneous Tissue) Exposed  exposed. There is no tunneling or undermining noted. There is a medium amount of sanguinous drainage noted. The wound margin is distinct with the outline attached to  the wound base. There is large (67-100%) pink, friable granulation within the wound bed. There is no necrotic tissue within the wound bed. 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 There was a Excisional Skin/Subcutaneous Tissue Debridement with a total area of 4 sq cm performed by Ricard Dillon., MD. With the following instrument(s): Curette to remove Non-Viable tissue/material. Material removed includes Subcutaneous Tissue. No specimens were taken. A time out was conducted at 08:27, prior to the start of the procedure. A Minimum amount of bleeding was controlled with Pressure. The procedure was tolerated well with a pain level of 0 throughout and a pain level of 0 following the procedure. Post Debridement Measurements: 8cm length x 1cm width x 0.2cm depth; 1.257cm^3 volume. Character of Wound/Ulcer Post Debridement is improved. Severity of Tissue Post Debridement is: Fat layer exposed. 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 There was a Four Layer Compression Therapy Procedure by Levan Hurst, RN. Post procedure Diagnosis Wound #37: Same as  Pre-Procedure Plan Follow-up Appointments: Return Appointment in 1 week. Dressing Change Frequency: Wound #37 Left,Lateral Malleolus: Do not change entire dressing for one week. Wound #38 Right T - Web between 1st and 2nd: oe Change Dressing every other day. Wound #41 Left Ischium: Change Dressing every other day. Skin Barriers/Peri-Wound Care: Antifungal cream - on toes on both feet daily Barrier cream - to leg/ankle and left foot Moisturizing lotion - both legs TCA Cream or Ointment Primary Wound Dressing: Wound #37 Left,Lateral Malleolus: Hydrofera Blue Wound #38 Right T - Web between 1st and 2nd: oe Calcium Alginate with Silver Wound #41 Left Ischium: Hydrofera Blue Secondary Dressing: Wound #37 Left,Lateral Malleolus: Dry Gauze ABD pad Wound #38 Right T - Web between 1st and 2nd: oe Kerlix/Rolled Gauze - secure with tape Dry Gauze Wound #41 Left Ischium: Dry Gauze ABD pad Edema Control: 4 layer compression: Left lower extremity Elevate legs to the level of the heart or above for 30 minutes daily and/or when sitting, a frequency of: - throughout the day Support Garment 30-40 mm/Hg pressure to: - Juxtalite to right leg Off-Loading: Low air-loss mattress (Group 2) Roho cushion for wheelchair Turn and reposition every 2 hours - out of wheelchair throughout the day, try to lay on sides, sleep in the bed not the recliner #1 I change the dressing to the left by Dr. Chrys Racer. We don't seem to be getting anywhere with this #2 left lateral leg Hydrofera Blue #3 right foot in between the first and second toes still silver alginate #4 the area on the left foot is closed. At one point I thought this was most difficult wound. Electronic Signature(s) Signed: 05/12/2020 6:03:41 PM By: Linton Ham MD Signed: 05/13/2020 5:24:50 PM By: Levan Hurst RN, BSN Entered By: Levan Hurst on 05/12/2020  10:33:04 -------------------------------------------------------------------------------- SuperBill Details Patient Name: Date of Service: Frericks, [name] 05/12/2020 Medical Record Number: [id number] Patient Account Number: [id number]  Date of Birth/Sex: Treating RN: 1986/06/29 (34 y.o. [name] Primary Care Provider: [place], [place] Other Clinician: Referring Provider: Treating Provider/Extender: [name]: 227 Diagnosis Coding ICD-10 Codes Code Description I87.332 Chronic venous hypertension (idiopathic) with ulcer and inflammation of left lower extremity L97.321 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 to breakdown of skin L97.521 Non-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 L97.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, 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, complete Facility Procedures CPT4 Code: [id number] Description: 17510 - DEB SUBQ TISSUE 20 SQ CM/< ICD-10 Diagnosis Description L97.321 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 to breakdown of skin Modifier: Quantity: 1 Physician Procedures : CPT4 Code Description Modifier [id number] - WC PHYS SUBQ TISS 20 SQ CM ICD-10 Diagnosis Description L97.321 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 to breakdown of skin Quantity: 1 Electronic Signature(s) Signed: 05/12/2020 6:03:41 PM By: Linton Ham MD Entered By: Linton Ham on 05/12/2020 08:58:02

## 2020-05-14 NOTE — Progress Notes (Signed)
[place], [name] ([id number]) Visit Report for 05/12/2020 Arrival Information Details Patient Name: Date of Service: [name], [name] Side. 05/12/2020 7:30 [name] Medical Record Number: [id number] Patient Account Number: [id number] Date of Birth/Sex: Treating RN: 1986/07/17 (34 y.o. [name]) [name] Primary Care [name] Philipp: [name], [name] Other Clinician: Referring [name]: Treating [name]/Extender: [name] in Treatment: 7 Visit Information History Since Last Visit All ordered tests and consults were completed: No Patient Arrived: Wheel Chair Added or deleted any medications: No Arrival Time: 08:00 Any new allergies or adverse reactions: No Accompanied By: self Had [name] fall or experienced change in No Transfer Assistance: None activities of daily living that may affect Patient Identification Verified: Yes risk of falls: Secondary Verification Process Completed: Yes Signs or symptoms of abuse/neglect since last visito No Patient Requires Transmission-Based Precautions: No Hospitalized since last visit: No Patient Has Alerts: Yes Implantable device outside of the clinic excluding No Patient Alerts: R ABI = 1.0 cellular tissue based products placed in the center L ABI = 1.1 since last visit: Has Dressing in Place as Prescribed: Yes Has Compression in Place as Prescribed: Yes Pain Present Now: No Electronic Signature(s) Signed: 05/14/2020 4:51:47 PM By: [name] Coria RN Entered By: [name] on 05/12/2020 10:12:01 -------------------------------------------------------------------------------- Compression Therapy Details Patient Name: Date of Service: [name], [name] 05/12/2020 7:30 [name] Medical Record Number: [id number] Patient Account Number: [id number] Date of Birth/Sex: Treating RN: September 18, 1986 (34 y.o. [name] Primary Care [name]: [name], [name] Other Clinician: Referring [name]: Treating [name]/Extender: [name] in  Treatment: 227 Compression Therapy Performed for Wound Assessment: Wound #37 Left,Lateral Malleolus Performed By: Clinician [name] Hurst, RN Compression Type: Four Layer Post Procedure Diagnosis Same as Pre-procedure Electronic Signature(s) Signed: 05/13/2020 5:24:50 PM By: [name] Hurst RN, BSN Entered By: [name] on 05/12/2020 10:31:46 -------------------------------------------------------------------------------- Encounter Discharge Information Details Patient Name: Date of Service: [name], [name] 05/12/2020 7:30 [name] Medical Record Number: [id number] Patient Account Number: [id number] Date of Birth/Sex: Treating RN: 07/10/1986 (34 y.o. [name] Primary Care [name]: [name], [name] Other Clinician: Referring Jovann Luse: Treating [name]/Extender: [name] in Treatment: 227 Encounter Discharge Information Items Post Procedure Vitals Discharge Condition: Stable Temperature (F): 98.6 Ambulatory Status: Wheelchair Pulse (bpm): 106 Discharge Destination: Home Respiratory Rate (breaths/min): 18 Transportation: Private Auto Blood Pressure (mmHg): 139/65 Accompanied By: self Schedule Follow-up Appointment: Yes Clinical Summary of Care: Electronic Signature(s) Signed: 05/12/2020 6:25:00 PM By: [name] Entered By: [name] on 05/12/2020 16:36:12 -------------------------------------------------------------------------------- Lower Extremity Assessment Details Patient Name: Date of Service: [name], [name] 05/12/2020 7:30 [name] Medical Record Number: [id number] Patient Account Number: [id number] Date of Birth/Sex: Treating RN: 01-Sep-1986 (34 y.o. [name] Primary Care Kaydin Karbowski: [name], [name] Other Clinician: Referring [name]: Treating [name]/Extender: [name] in Treatment: 227 Edema Assessment Assessed: [Left: No] [Right: No] Edema: [Left: No] [Right: Yes] Calf Left: Right: Point of  Measurement: 33 cm From Medial Instep 26 cm 32 cm Ankle Left: Right: Point of Measurement: 10 cm From Medial Instep 23 cm 26 cm Electronic Signature(s) Signed: 05/14/2020 4:51:47 PM By: [name] Coria RN Entered By: [name] on 05/12/2020 10:13:03 -------------------------------------------------------------------------------- [place] Details Patient Name: Date of Service: [name], [name] 05/12/2020 7:30 [name] Medical Record Number: [id number] Patient Account Number: [id number] Date of Birth/Sex: Treating RN: 10-21-1986 (35 y.o. [name] Primary Care [name]: [name], [name] Other Clinician: Referring Nasiyah Laverdiere: Treating [name]/Extender: [name] Carl  [name] in Treatment: 227 Active Inactive Wound/Skin Impairment Nursing Diagnoses: Impaired tissue integrity Knowledge deficit related to ulceration/compromised skin integrity Goals: Patient/caregiver will verbalize understanding of skin care regimen Date Initiated: 01/05/2016 Target Resolution Date: 06/13/2020 Goal Status: Active Ulcer/skin breakdown will have [name] volume reduction of 30% by week 4 Date Initiated: 01/05/2016 Date Inactivated: 12/22/2017 Target Resolution Date: 01/19/2018 Unmet Reason: complex wounds, Goal Status: Unmet infection Interventions: Assess patient/caregiver ability to obtain necessary supplies Assess ulceration(s) every visit Provide education on ulcer and skin care Notes: Electronic Signature(s) Signed: 05/13/2020 5:24:50 PM By: [name] Hurst RN, BSN Entered By: [name] on 05/12/2020 10:25:23 -------------------------------------------------------------------------------- Pain Assessment Details Patient Name: Date of Service: [name], [name] 05/12/2020 7:30 [name] Medical Record Number: [id number] Patient Account Number: [id number] Date of Birth/Sex: Treating RN: [birth year] (34 y.o. [name] Primary Care [name]: [name], [name] Other  Clinician: Referring [name]: Treating [name]/Extender: [name] in Treatment: 227 Active Problems Location of Pain Severity and Description of Pain Patient Has Paino No Site Locations Pain Management and Medication Current Pain Management: Electronic Signature(s) Signed: 05/14/2020 4:51:47 PM By: [name] Coria RN Entered By: [name] on 05/12/2020 10:12:55 -------------------------------------------------------------------------------- Patient/Caregiver Education Details Patient Name: Date of Service: [name], [name] Side 6/21/2021andnbsp7:30 [name] Medical Record Number: [id number] Patient Account Number: [id number] Date of Birth/Gender: Treating RN: January 17, 1986 (34 y.o. [name] Primary Care Physician: Janine Limbo Other Clinician: Referring Physician: Treating Physician/Extender: [name] in Treatment: 53 Education Assessment Education Provided To: Patient Education Topics Provided Wound/Skin Impairment: Methods: Explain/Verbal Responses: State content correctly Electronic Signature(s) Signed: 05/13/2020 5:24:50 PM By: [name] Hurst RN, BSN Entered By: [name] on 05/12/2020 10:26:09 -------------------------------------------------------------------------------- Wound Assessment Details Patient Name: Date of Service: [name], [name] 05/12/2020 7:30 [name] Medical Record Number: [id number] Patient Account Number: [id number] Date of Birth/Sex: Treating RN: Jan 23, 1986 (34 y.o. [name] Primary Care Inaya Gillham: [name], [name] Other Clinician: Referring Wallie Lagrand: Treating Jeriah Corkum/Extender: [name] in Treatment: 227 Wound Status Wound Number: 24 Primary Etiology: Inflammatory Wound Location: Left, Dorsal Foot Wound Status: Healed - Epithelialized Wounding Event: Gradually Appeared Comorbid History: Sleep Apnea, Hypertension, Paraplegia Date Acquired: 03/08/2018 [name]  Of Treatment: 113 Clustered Wound: Yes Photos Wound Measurements Length: (cm) Width: (cm) Depth: (cm) Clustered Quantity: Area: (cm) Volume: (cm) 0 % Reduction in Area: 100% 0 % Reduction in Volume: 100% 0 Epithelialization: Large (67-100%) 1 Tunneling: No 0 Undermining: No 0 Wound Description Classification: Full Thickness Without Exposed Support Structures Wound Margin: Flat and Intact Exudate Amount: None Present Foul Odor After Cleansing: No Slough/Fibrino No Wound Bed Granulation Amount: None Present (0%) Exposed Structure Necrotic Amount: None Present (0%) Fascia Exposed: No Fat Layer (Subcutaneous Tissue) Exposed: No Tendon Exposed: No Muscle Exposed: No Joint Exposed: No Bone Exposed: No Electronic Signature(s) Signed: 05/14/2020 4:51:47 PM By: [name] Coria RN Signed: 05/14/2020 5:20:20 PM By: Minerva Fester Entered By: Minerva Fester on 05/14/2020 10:25:44 -------------------------------------------------------------------------------- Wound Assessment Details Patient Name: Date of Service: [name], [name] 05/12/2020 7:30 [name] Medical Record Number: [id number] Patient Account Number: [id number] Date of Birth/Sex: Treating RN: [birth year] (33 y.o. [name] Primary Care [place] Lembke: [place], [place] Other Clinician: Referring Francely Craw: Treating Ayerim Berquist/Extender: [name] in Treatment: 227 Wound Status Wound Number: 37 Primary Etiology: Venous Leg Ulcer Wound Location: Left, Lateral Malleolus Wound Status: Open Wounding Event: Gradually Appeared Comorbid History: Sleep Apnea, Hypertension, Paraplegia Date Acquired: 11/26/2019 [name] Of Treatment: 24 Clustered Wound:  No Photos Wound Measurements Length: (cm) 8 Width: (cm) 1 Depth: (cm) 0.2 Area: (cm) 6.283 Volume: (cm) 1.257 % Reduction in Area: -58.7% % Reduction in Volume: -217.4% Epithelialization: Large (67-100%) Tunneling: No Undermining: No Wound  Description Classification: Full Thickness Without Exposed Support Structures Wound Margin: Flat and Intact Exudate Amount: Medium Exudate Type: Serosanguineous Exudate Color: red, brown Foul Odor After Cleansing: No Slough/Fibrino Yes Wound Bed Granulation Amount: Large (67-100%) Exposed Structure Granulation Quality: Red Fascia Exposed: No Necrotic Amount: Small (1-33%) Fat Layer (Subcutaneous Tissue) Exposed: Yes Tendon Exposed: No Muscle Exposed: No Joint Exposed: No Bone Exposed: No Treatment Notes Wound #37 (Left, Lateral Malleolus) 1. Cleanse With Wound Cleanser Soap and water 2. Periwound Care Moisturizing lotion TCA Cream 3. Primary Dressing Applied Hydrofera Blue 4. Secondary Dressing Dry Gauze 6. Support Layer Applied 4 layer compression wrap Electronic Signature(s) Signed: 05/14/2020 4:51:47 PM By: [name] Coria RN Signed: 05/14/2020 5:20:20 PM By: Minerva Fester Entered By: Minerva Fester on 05/14/2020 10:26:24 -------------------------------------------------------------------------------- Wound Assessment Details Patient Name: Date of Service: [name], [name] 05/12/2020 7:30 [name] Medical Record Number: [id number] Patient Account Number: [id number] Date of Birth/Sex: Treating RN: 1985/12/17 (34 y.o. [name] Primary Care Massimiliano Rohleder: [name], [name] Other Clinician: Referring Breawna Montenegro: Treating Zali Kamaka/Extender: [name] in Treatment: 227 Wound Status Wound Number: 38 Primary Etiology: Inflammatory Wound Location: Right T - Web between 1st and 2nd oe Wound Status: Open Wounding Event: Gradually Appeared Comorbid History: Sleep Apnea, Hypertension, Paraplegia Date Acquired: 11/30/2019 [name] Of Treatment: 23 Clustered Wound: No Photos Wound Measurements Length: (cm) 1 Width: (cm) 0.2 Depth: (cm) 0.4 Area: (cm) 0.157 Volume: (cm) 0.063 % Reduction in Area: 52.4% % Reduction in Volume: 72.7% Epithelialization: Medium  (34-66%) Tunneling: No Undermining: No Wound Description Classification: Full Thickness Without Exposed Support Structures Wound Margin: Distinct, outline attached Exudate Amount: Medium Exudate Type: Serosanguineous Exudate Color: red, brown Foul Odor After Cleansing: No Slough/Fibrino Yes Wound Bed Granulation Amount: Large (67-100%) Exposed Structure Granulation Quality: Red Fat Layer (Subcutaneous Tissue) Exposed: Yes Necrotic Amount: Small (1-33%) Necrotic Quality: Adherent Slough Treatment Notes Wound #38 (Right Toe - Web between 1st and 2nd) 1. Cleanse With Wound Cleanser 2. Periwound Care Antifungal cream Moisturizing lotion TCA Cream 3. Primary Dressing Applied Calcium Alginate Ag 4. Secondary Dressing Dry Gauze Roll Gauze 5. Secured With Medipore tape 6. Support Layer Applied Compression garment Notes antifungal cream to toes Electronic Signature(s) Signed: 05/14/2020 4:51:47 PM By: [name] Coria RN Signed: 05/14/2020 5:20:20 PM By: Minerva Fester Entered By: Minerva Fester on 05/14/2020 10:24:37 -------------------------------------------------------------------------------- Wound Assessment Details Patient Name: Date of Service: [name], [name] 05/12/2020 7:30 [name] Medical Record Number: [id number] Patient Account Number: [id number] Date of Birth/Sex: Treating RN: 28-Dec-1985 (34 y.o. [name] Primary Care Lailah Marcelli: Gleason, Glynn Other Clinician: Referring Caylynn Minchew: Treating Shirline Kendle/Extender: [name] in Treatment: 227 Wound Status Wound Number: 41 Primary Etiology: Pressure Ulcer Wound Location: Left Ischium Wound Status: Open Wounding Event: Gradually Appeared Comorbid History: Sleep Apnea, Hypertension, Paraplegia Date Acquired: 03/16/2020 [name] Of Treatment: 8 Clustered Wound: No Photos Wound Measurements Length: (cm) 4.7 % R Width: (cm) 2 % R Depth: (cm) 0.2 Epi Area: (cm) 7.383 Tu Volume: (cm) 1.477  Un eduction in Area: -308.8% eduction in Volume: -716% thelialization: Medium (34-66%) nneling: No dermining: No Wound Description Classification: Category/Stage II Fo Wound Margin: Distinct, outline attached Sl Exudate Amount: Medium Exudate Type: Sanguinous Exudate Color: red ul Odor After Cleansing: No ough/Fibrino No Wound Bed  Granulation Amount: Large (67-100%) Exposed Structure Granulation Quality: Pink, Friable Fascia Exposed: No Necrotic Amount: None Present (0%) Fat Layer (Subcutaneous Tissue) Exposed: Yes Tendon Exposed: No Muscle Exposed: No Joint Exposed: No Bone Exposed: No Treatment Notes 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 Cleanse With Wound Cleanser 3. Primary Dressing Applied Hydrofera Blue 4. Secondary Dressing Foam Border Dressing 5. Secured With Office manager) Signed: 05/14/2020 4:51:47 PM By: [name] Coria RN Signed: 05/14/2020 5:20:20 PM By: Minerva Fester Entered By: Minerva Fester on 05/14/2020 10:27:06 -------------------------------------------------------------------------------- Vitals Details Patient Name: Date of Service: [name], [name] 05/12/2020 7:30 [name] Medical Record Number: [id number] Patient Account Number: [id number] Date of Birth/Sex: Treating RN: 1986-10-17 (34 y.o. [name] Primary Care Tobyn Osgood: [place], [place] Other Clinician: Referring Henli Hey: Treating Yazmeen Woolf/Extender: Dutch Gray, [name] in Treatment: 227 Vital Signs Time Taken: 08:00 Temperature (F): 98.6 Height (in): 70 Pulse (bpm): 109 Weight (lbs): 216 Respiratory Rate (breaths/min): 18 Body Mass Index (BMI): 31 Blood Pressure (mmHg): 139/65 Reference Range: 80 - 120 mg / dl Electronic Signature(s) Signed: 05/14/2020 4:51:47 PM By: [name] Coria RN Entered By: [name] on 05/12/2020 10:12:46

## 2020-05-19 ENCOUNTER — Encounter (HOSPITAL_BASED_OUTPATIENT_CLINIC_OR_DEPARTMENT_OTHER): Payer: BC Managed Care – PPO | Admitting: Internal Medicine

## 2020-05-19 DIAGNOSIS — Z8614 Personal history of Methicillin resistant Staphylococcus aureus infection: Secondary | ICD-10-CM | POA: Diagnosis not present

## 2020-05-19 DIAGNOSIS — Z87440 Personal history of urinary (tract) infections: Secondary | ICD-10-CM | POA: Diagnosis not present

## 2020-05-19 DIAGNOSIS — L89623 Pressure ulcer of left heel, stage 3: Secondary | ICD-10-CM | POA: Diagnosis not present

## 2020-05-19 DIAGNOSIS — I890 Lymphedema, not elsewhere classified: Secondary | ICD-10-CM | POA: Diagnosis not present

## 2020-05-19 DIAGNOSIS — L89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, stage 3: Secondary | ICD-10-CM | POA: Diagnosis not present

## 2020-05-19 DIAGNOSIS — G8221 Paraplegia, complete: Secondary | ICD-10-CM | POA: Diagnosis not present

## 2020-05-19 DIAGNOSIS — I10 Essential (primary) hypertension: Secondary | ICD-10-CM | POA: Diagnosis not present

## 2020-05-19 DIAGNOSIS — I872 Venous insufficiency (chronic) (peripheral): Secondary | ICD-10-CM | POA: Diagnosis not present

## 2020-05-19 DIAGNOSIS — L97321 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 to breakdown of skin: Secondary | ICD-10-CM | POA: Diagnosis not present

## 2020-05-19 DIAGNOSIS — L03115 Cellulitis of right lower limb: Secondary | ICD-10-CM | POA: Diagnosis not present

## 2020-05-19 DIAGNOSIS — L97521 Non-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: Secondary | ICD-10-CM | POA: Diagnosis not present

## 2020-05-19 DIAGNOSIS — Z9081 Acquired absence of spleen: Secondary | ICD-10-CM | POA: Diagnosis not present

## 2020-05-19 DIAGNOSIS — I87332 Chronic venous hypertension (idiopathic) with ulcer and inflammation of left lower extremity: Secondary | ICD-10-CM | POA: Diagnosis not present

## 2020-05-19 DIAGNOSIS — L97511 Non-pressure chronic ulcer of other part of right foot limited to breakdown of skin: Secondary | ICD-10-CM | POA: Diagnosis not present

## 2020-05-19 NOTE — Progress Notes (Signed)
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 in treated him aggressively for this although we have not made any improvement here. 08/11/17; left lateral leg. Surface continues to improve with the Iodoflex although we are not seeing much improvement in overall wound dimensions. Areas on his plantar left foot and right foot show no improvement. In fact the right foot looks somewhat worse 08/18/17; left lateral leg. We changed to [facility] Blue last week after a prolonged course of Iodoflex which helps get the surface better. It appears that the wound with is improved. Continue with difficult areas on the left dorsal first second and plantar first second on the right  09/01/17; patient arrives in clinic today having had a temperature of 103 yesterday. He was seen in the ER and [facility]. The patient was concerned he could have cellulitis again in the right leg however they diagnosed him with a UTI and he is now on Keflex. He has a history of cellulitis which is been recurrent and difficult but this is been in the left leg, in the past 5 use doxycycline. He does in and out catheterizations at home which are risk factors for UTI 09/08/17; patient will be completing his Keflex this weekend. The erythema on the left leg is considerably better. He has a new  wound today on the medial part of the right leg small superficial almost looks like a skin tear. He has worsening of the area on the right dorsal first and second toe. His major area on the left lateral leg is better. Using Hydrofera Blue on all areas 09/15/17; gradual reduction in width on the long wound in the left lateral leg. No debridement required. He also has wounds on the plantar aspect of his left first second toe web space and on the dorsal aspect of the right first second toe web space. 09/22/17; there continues to be very gradual improvements in the dimensions of the left lateral leg wound. He hasn't round erythematous spot with might be pressure on his wheelchair. There is no evidence obviously of infection no purulence no warmth He has a dry scaled area on the plantar aspect of the left first second toe Improved area on the dorsal right first second toe. 09/29/17; left lateral leg wound continues to improve in dimensions mostly with an is still a fairly long but increasingly narrow wound. He has a dry scaled area on the plantar aspect of his left first second toe web space Increasingly concerning area on the dorsal right first second toe. In fact I am concerned today about possible cellulitis around this wound. The areas extending up his second toe and although there is deformities here almost appears to abut on the nailbed. 10/06/17; left lateral leg wound continues to make very gradual progress. Tissue culture I did from the right first second toe dorsal foot last time grew MRSA and enterococcus which was vancomycin sensitive. This was not sensitive to clindamycin or doxycycline. He is allergic to Zyvox and sulfa we have therefore arrange for him to have dalvance infusion tomorrow. He is had this in the past and tolerated it well 10/20/17; left lateral leg wound continues to make decent progress. This is certainly reduced in terms of with there is advancing epithelialization.The  cellulitis in the right foot looks better although he still has a deep wound in the dorsal aspect of the first second toe web space. Plantar left first toe web space on the left I think is making some progress 10/27/17; left lateral leg wound continues to make decent progress. Advancing epithelialization.using Hydrofera Blue The right first second toe web space wound is better-looking using silver alginate Improvement in the left plantar first second toe web space. Again using silver alginate 11/03/17 left lateral leg wound continues to make decent progress albeit slowly. Using [facility] The right per second toe web space continues to be a very problematic looking punched out wound. I obtained a piece of tissue for deep culture I did extensively treated this for fungus. It is difficult to imagine that this is a pressure area as the patient states other than going outside he doesn't really wear shoes at home The  left plantar first second toe web space looked fairly senescent. Necrotic edges. This required debridement change to [facility] Blue to all wound areas 11/10/17; left lateral leg wound continues to contract. Using Hydrofera Blue On the right dorsal first second toe web space dorsally. Culture I did of this area last week grew MRSA there is not an easy oral option in this patient was multiple antibiotic allergies or intolerances. This was only a rare culture isolate I'm therefore going to use Bactroban under silver alginate On the left plantar first second toe web space. Debridement is required here. This is also unchanged 11/17/17; left lateral leg wound continues to contract using Hydrofera Blue this is no longer the major issue. The major concern here is the right first second toe web space. He now has an open area going from dorsally to the plantar aspect. There is now wound on the inner lateral part of the first toe. Not a very viable surface on this. There is erythema spreading  medially into the forefoot. No major change in the left first second toe plantar wound 11/24/17; left lateral leg wound continues to contract using Hydrofera Blue. Nice improvement today The right first second toe web space all of this looks a lot less angry than last week. I have given him clindamycin and topical Bactroban for MRSA and terbinafine for the possibility of underlining tinea pedis that I could not control with ketoconazole. Looks somewhat better The area on the plantar left first second toe web space is weeping with dried debris around the wound 12/01/17; left lateral leg wound continues to contract he Hydrofera Blue. It is becoming thinner in terms of with nevertheless it is making good improvement. The right first second toe web space looks less angry but still a large necrotic-looking wounds starting on the plantar aspect of the right foot extending between the toes and now extensively on the base of the right second toe. I gave him clindamycin and topical Bactroban for MRSA anterior benefiting for the possibility of underlying tinea pedis. Not looking better today The area on the left first/second toe looks better. Debrided of necrotic debris 12/05/17* the patient was worked in urgently today because over the weekend he found blood on his incontinence bad when he woke up. He was found to have an ulcer by his wife who does most of his wound care. He came in today for [place] to look at this. He has not had a history of wounds in his buttocks in spite of his paraplegia. 12/08/17; seen in follow-up today at his usual appointment. He was seen earlier this week and found to have a new wound on his buttock. We also follow him for wounds on the left lateral leg, left first second toe web space and right first second toe web space 12/15/17; we have been using Hydrofera Blue to the left lateral leg which has improved. The right first second toe web space has also improved. Left first second toe web  space plantar aspect looks stable. The left buttock has worsened using Santyl. Apparently the buttock has drainage 12/22/17; we have been using Hydrofera Blue to the left lateral leg which continues to improve now 2 small wounds separated by normal skin. He tells [place] he had a fever up to 100 yesterday he is prone to UTIs but has not noted anything different. He does in and out catheterizations. The area between the first and second toes today does not look good necrotic surface covered with what looks  to be purulent drainage and erythema extending into the third toe. I had gotten this to something that I thought look better last time however it is not look good today. He also has a necrotic surface over the buttock wound which is expanded. I thought there might be infection under here so I removed a lot of the surface with a #5 curet though nothing look like it really needed culturing. He is been using Santyl to this area 12/27/17; his original wound on the left lateral leg continues to improve using Hydrofera Blue. I gave him samples of Baxdella although he was unable to take them out of fear for an allergic reaction ["lump in his throat"].the culture I did of the purulent drainage from his second toe last week showed both enterococcus and a set Enterobacter I was also concerned about the erythema on the bottom of his foot although paradoxically although this looks somewhat better today. 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 We've been using silver alginate here. They came up today that he sleeps in a recliner, I'm not sure why but I asked him to stop this 01/03/18; his original wound we've been using Hydrofera Blue is now separated into 2 areas. Ulcer on his left buttock is better he is off the recliner and sleeping in bed Finally both wound areas between his first and second toes also looks some  better 01/10/18; his original wound on the left lateral leg is now separated into 2 wounds we've been using Hydrofera Blue Ulcer on his left buttock has some drainage. There is a small probing site going into muscle layer superiorly.using silver alginate -He arrives today with a deep tissue injury on the left heel The wound on the dorsal aspect of his first second toe on the left looks a lot betterusing silver alginate ketoconazole The area on the first second toe web space on the right also looks a lot bette 01/17/18; his original wound on the left lateral leg continues to progress using Hydrofera Blue Ulcer on his left buttock also is smaller surface healthier except for a small probing site going into the muscle layer superiorly. 2.4 cm of tunneling in this area DTI on his left heel we have only been offloading. Looks better than last week no threatened open no evidence of infection the wound on the dorsal aspect of the first second toe on the left continues to look like it's regressing we have only been using silver alginate and terbinafine orally The area in the first second toe web space on the right also looks to be a lot better using silver alginate and terbinafine I think this was prompted by tinea pedis 01/31/18; the patient was hospitalized in [place] last week apparently for a complicated UTI. He was discharged on cefepime he does in and out catheterizations. In the hospital he was discovered M I don't mild elevation of AST and ALT and the terbinafine was stopped.predictably the pressure ulcer on s his buttock looks betterusing silver alginate. The area on the left lateral leg also is better using Hydrofera Blue. The area between the first and second toes on the left better. First and second toes on the right still substantial but better. Finally the DTI on the left heel has held together and looks like it's resolving 02/07/18-he is here in follow-up evaluation for multiple ulcerations.  He has new injury to the lateral aspect of  the last issue a pressure ulcer, he states this is from adhesive removal trauma. He states he has tried multiple adhesive products with no success. All other ulcers appear stable. The left heel DTI is resolving. We will continue with same treatment plan and follow-up next week. 02/14/18; follow-up for multiple areas. He has a new area last week on the lateral aspect of his pressure ulcer more over the posterior trochanter. The original pressure ulcer looks quite stable has healthy granulation. 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 Almost fully epithelialized on the original superior area using Hydrofera Blue DTI on the left heel has peeled off this week to reveal a small superficial wound under denuded skin and subcutaneous tissue Both areas between the first and second toes look better including nothing open on the left 02/21/18; The patient's wounds on his left ischial tuberosity and posterior left greater trochanter actually looked better. He has a large area of irritation around the area which I think is contact dermatitis. I am doubtful that this is fungal His original wound on the left lateral calf continues to improve we have been using Hydrofera Blue There is no open area in the left first second toe web space although there is a lot of thick callus The DTI on the left heel required debridement today of necrotic surface eschar and subcutaneous tissue using silver alginate Finally the area on the right first second toe webspace continues to contract using silver alginate and ketoconazole 02/28/18 Left ischial tuberosity wounds look better using silver alginate. Original wound on the left calf only has one small open area left using Hydrofera Blue DTI on the left heel required debridement mostly removing skin from around this wound surface. Using  silver alginate The areas on the right first/second toe web space using silver alginate and ketoconazole 03/08/18 on evaluation today patient appears to be doing decently well as best I can tell in regard to his wounds. This is the first time that I have seen him as he generally is followed by Dr. Dellia Nims. With that being said none of his wounds appear to be infected he does have an area where there is some skin covering what appears to be a new wound on the left dorsal surface of his great toe. This is right at the nail bed. With that being said I do believe that debrided away some of the excess skin can be of benefit in this regard. Otherwise he has been tolerating the dressing changes without complication. 03/14/18; patient arrives today with the multiplicity of wounds that we are following. He has not been systemically unwell Original wound on the left lateral calf now only has 2 small open areas we've been using Hydrofera Blue which should continue The deep tissue injury on the left heel requires debridement today. We've been using silver alginate The left first second toe and the right first second toe are both are reminiscence what I think was tinea pedis. Apparently some of the callus Surface between the toes was removed last week when it started draining. Purulent drainage coming from the wound on the ischial tuberosity on the left. 03/21/18-He is here in follow-up evaluation for multiple wounds. 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 He tolerated debridement. The only change to last week's recommendations is to discontinue antifungal cream between toes. He will follow-up next week  03/28/18; following up for multiple wounds;Concern this week is streaking redness and swelling in the right foot. He is going to need antibiotics for this. 03/31/18; follow-up for right foot cellulitis. Streaking redness and swelling in the  right foot on 03/28/18. 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 I put him on clindamycin 300 mg every 6 and brought him in for a quick check. He has an open wound between his first and second toes on the right foot as a potential source. 04/04/18; Right foot cellulitis is resolving he is completing clindamycin. This is truly good news Left lateral calf wound which is initial wound only has one small open area inferiorly this is close to healing out. He has compression stockings. We will use Hydrofera Blue right down to the epithelialization of this Nonviable surface on the left heel which was initially pressure with a DTI. We've been using Hydrofera Blue. I'm going to switch this back to silver alginate Left first second toe/tinea pedis this looks better using silver alginate Right first second toe tinea pedis using silver alginate Large pressure ulcers on theLeft ischial tuberosity. Small wound here Looks better. I am uncertain about the surface over the large wound. Using silver alginate 04/11/18; Cellulitis in the right foot is resolved Left lateral calf wound which was his original wounds still has 2 tiny open areas remaining this is just about closed Nonviable surface on the left heel is better but still requires debridement Left first second toe/tinea pedis still open using silver alginate Right first second toe wound tinea pedis I asked him to go back to using ketoconazole and silver alginate Large pressure ulcers on the left ischial tuberosity this shear injury here is resolved. Wound is smaller. No evidence of infection using silver alginate 04/18/18; Patient arrives with an intense area of cellulitis in the right mid lower calf extending into the right heel area. Bright red and warm. Smaller area on the left anterior leg. He has a significant history of MRSA. He will definitely need antibioticsdoxycycline He now has 2 open areas on the left ischial tuberosity the  original large wound and now a satellite area which I think was above his initial satellite areas. Not a wonderful surface on this satellite area surrounding erythema which looks like pressure related. His left lateral calf wound again his original wound is just about closed Left heel pressure injury still requiring debridement Left first second toe looks a lot better using silver alginate Right first second toe also using silver alginate and ketoconazole cream also looks better 04/20/18; the patient was worked in early today out of concerns with his cellulitis on the right leg. I had started him on doxycycline. This was 2 days ago. His wife was concerned about the swelling in the area. Also concerned about the left buttock. He has not been systemically unwell no fever chills. No nausea vomiting or diarrhea 04/25/18; the patient's left buttock wound is continued to deteriorate he is using Hydrofera Blue. He is still completing clindamycin for the cellulitis on the right leg although all of this looks better. 05/02/18 Left buttock wound still with a lot of drainage and a very tightly adherent fibrinous necrotic surface. He has a deeper area superiorly The left lateral calf wound is still closed DTI wound on the left heel necrotic surface especially the circumference using Iodoflex Areas between his left first second toe and right first second toe both look better. Dorsally and the right first second toe he  had a necrotic surface although at smaller. In using silver alginate and ketoconazole. I did a culture last week which was a deep tissue culture of the reminiscence of the open wound on the right first second toe dorsally. This grew a few Acinetobacter and a few methicillin-resistant staph aureus. Nevertheless the area actually this week looked better. I didn't feel the need to specifically address this at least in terms of systemic antibiotics. 05/09/18; wounds are measuring larger more drainage per  our intake. We are using Santyl covered with alginate on the large superficial buttock wounds, Iodosorb on the left heel, ketoconazole and silver alginate to the dorsal first and second toes bilaterally. 05/16/18; The area on his left buttock better in some aspects although the area superiorly over the ischial tuberosity required an extensive debridement.using Santyl Left heel appears stable. Using Iodoflex The areas between his first and second toes are not bad however there is spreading erythema up the dorsal aspect of his left foot this looks like cellulitis again. He is insensate the erythema is really very brilliant.o Erysipelas He went to see an allergist days ago because he was itching part of this he had lab work done. This showed a white count of 15.1 with 70% neutrophils. Hemoglobin of 11.4 and a platelet count of 659,000. Last white count we had in Epic was a 2-1/2 years ago which was 25.9 but he was ill at the time. He was able to show me some lab work that was done by his primary physician the pattern is about the same. 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 But prompted me to go ahead and do x-rays of both feet and the pelvis rule out osteomyelitis. He also had a comprehensive metabolic panel this was reasonably normal his albumin was 3.7 liver function tests BUN/creatinine all normal 05/23/18; x-rays of both his feet from last week were negative for underlying pulmonary abnormality. The x-ray of his pelvis however showed mild irregularity in the left ischial which may represent some early osteomyelitis. The wound in the left ischial continues to get deeper clearly now exposed muscle. Each week necrotic surface material over this area. Whereas the rest of the wounds do not look so bad. The left ischial wound we have been using Santyl and calcium alginate T the left heel surface necrotic debris using Iodoflex o The left lateral leg is still  healed Areas on the left dorsal foot and the right dorsal foot are about the same. There is some inflammation on the left which might represent contact dermatitis, fungal dermatitis I am doubtful cellulitis although this looks better than last week 05/30/18; CT scan done at Hospital did not show any osteomyelitis or abscess. Suggested the possibility of underlying cellulitis although I don't see a lot of evidence of this at the bedside The wound itself on the left buttock/upper thigh actually looks somewhat better. No debridement Left heel also looks better no debridement continue Iodoflex Both dorsal first second toe spaces appear better using Lotrisone. Left still required debridement 06/06/18; Intake reported some purulent looking drainage from the left gluteal wound. Using Santyl and calcium alginate Left heel looks better although still a nonviable surface requiring debridement The left dorsal foot first/second webspace actually expanding and somewhat deeper. I may consider doing a shave biopsy of this area Right dorsal foot first/second webspace appears stable to improved. Using Lotrisone and silver alginate to both these areas 06/13/18 Left gluteal surface looks  better. Now separated in the 2 wounds. No debridement required. Still drainage. We'll continue silver alginate Left heel continues to look better with Iodoflex continue this for at least another week Of his dorsal foot wounds the area on the left still has some depth although it looks better than last week. We've been using Lotrisone and silver alginate 06/20/18 Left gluteal continues to look better healthy tissue Left heel continues to look better healthy granulation wound is smaller. He is using Iodoflex and his long as this continues continue the Iodoflex Dorsal right foot looks better unfortunately dorsal left foot does not. There is swelling and erythema of his forefoot. He had minor trauma to this several days ago but doesn't  think this was enough to have caused any tissue injury. Foot looks like cellulitis, we have had this problem before 06/27/18 on evaluation today patient appears to be doing a little worse in regard to his foot ulcer. 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 Both of which would really be his only options for treating this infection. In the past he has been given and effusion of Orbactiv. This is done very well for him in the past again it's one time dosing IV antibiotic therapy. Subsequently I do believe this is something we're gonna need to see about doing at this point in time. Currently his other wounds seem to be doing somewhat better in my pinion I'm pretty happy in that regard. 07/03/18 on evaluation today patient's wounds actually appear to be doing fairly well. He has been tolerating the dressing changes without complication. All in all he seems to be showing signs of improvement. In regard to the antibiotics he has been dealing with infectious disease since I saw him last week as far as getting this scheduled. In the end [facility]'s [facility] this coming Friday. In the meantime he has been continuing to perform the dressing changes in such as previous. There does not appear to be any evidence of infection worsengin at this time. 07/10/18; Since I last saw this man 2 weeks ago things have actually improved. IV antibiotics of resulted in less forefoot erythema although there is still some present. He is not systemically unwell Left buttock wounds 2 now have no depth there is increased epithelialization Using silver alginate Left heel still requires debridement using Iodoflex Left dorsal foot still with a sizable wound about the size of a border but healthy granulation Right dorsal foot still with a  slitlike area using silver alginate 07/18/18; the patient's cellulitis in the left foot is improved in fact I think it is on its way to resolving. Left buttock wounds 2 both look better although the larger one has hypertension granulation we've been using silver alginate Left heel has some thick circumferential redundant skin over the wound edge which will need to be removed today we've been using Iodoflex Left dorsal foot is still a sizable wound required debridement using silver alginate The right dorsal foot is just about closed only a small open area remains here 07/25/18; left foot cellulitis is resolved Left buttock wounds 2 both look better. Hyper-granulation on the major area Left heel as some debris over the surface but otherwise looks a healthier wound. Using silver collagen Right dorsal foot is just about closed 07/31/18; arrives with  our intake nurse worried about purulent drainage from the buttock. We had hyper-granulation here last week His buttock wounds 2 continue to look better Left heel some debris over the surface but measuring smaller. Right dorsal foot unfortunately has openings between the toes Left foot superficial wound looks less aggravated. 08/07/18 Buttock wounds continue to look better although some of her granulation and the larger medial wound. silver alginate Left heel continues to look a lot better.silver collagen Left foot superficial wound looks less stable. Requires debridement. He has a new wound superficial area on the foot on the lateral dorsal foot. Right foot looks better using silver alginate without Lotrisone 08/14/2018; patient was in the ER last week diagnosed with a UTI. He is now on Cefpodoxime and Macrodantin. Buttock wounds continued to be smaller. Using silver alginate Left heel continues to look better using silver collagen Left foot superficial wound looks as though it is improving Right dorsal foot area is just about healed. 08/21/2018; patient is  completed his antibiotics for his UTI. He has 2 open areas on the buttocks. There is still not closed although the surface looks satisfactory. Using silver alginate Left heel continues to improve using silver collagen The bilateral dorsal foot areas which are at the base of his first and second toes/possible tinea pedis are actually stable on the left but worse on the right. The area on the left required debridement of necrotic surface. After debridement I obtained a specimen for PCR culture. The right dorsal foot which is been just about healed last week is now reopened 08/28/2018; culture done on the left dorsal foot showed coag negative staph both staph epidermidis and Lugdunensis. I think this is worthwhile initiating systemic treatment. I will use doxycycline given his long list of allergies. The area on the left heel slightly improved but still requiring debridement. The large wound on the buttock is just about closed whereas the smaller one is larger. Using silver alginate in this area 09/04/2018; patient is completing his doxycycline for the left foot although this continues to be a very difficult wound area with very adherent necrotic debris. We are using silver alginate to all his wounds right foot left foot and the small wounds on his buttock, silver collagen on the left heel. 09/11/2018; once again this patient has intense erythema and swelling of the left forefoot. Lesser degrees of erythema in the right foot. He has a long list of allergies and intolerances. I will reinstitute doxycycline. 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 Using silver alginate Left heel also looks better using silver collagen Unfortunately both the areas on his feet look worse. The area on the left first second webspace is now gone through to the plantar part of his foot. The area on the left foot anteriorly is irritated with erythema and swelling in the  forefoot. 09/25/2018 His wound on the left plantar heel looks better. Using silver collagen The area on the left buttock 2 small remnant areas. One is closed one is still open. Using silver alginate The areas between both his first and second toes look worse. This in spite of long-standing antifungal therapy with ketoconazole and silver alginate which should have antifungal activity He has small areas around his original wound on the left calf one is on the bottom of the original scar tissue and one superiorly both of these are small and superficial but again given wound history in this site this is  worrisome 10/02/2018 Left plantar heel continues to gradually contract using silver collagen Left buttock wound is unchanged using silver alginate The areas on his dorsal feet between his first and second toes bilaterally look about the same. I prescribed clindamycin ointment to see if we can address chronic staph colonization and also the underlying possibility of erythrasma The left lateral lower extremity wound is actually on the lateral part of his ankle. Small open area here. We have been using silver alginate 10/09/2018; Left plantar heel continues to look healthy and contract. No debridement is required Left buttock slightly smaller with a tape injury wound just below which was new this week Dorsal feet somewhat improved I have been using clindamycin Left lateral looks lower extremity the actual open area looks worse although a lot of this is epithelialized. I am going to change to silver collagen today He has a lot more swelling in the right leg although this is not pitting not red and not particularly warm there is a lot of spasm in the right leg usually indicative of people with paralysis of some underlying discomfort. We have reviewed his vascular status from 2017 he had a left greater saphenous vein ablation. I wonder about referring him back to vascular surgery if the area on the left  leg continues to deteriorate. 10/16/2018 in today for follow-up and management of multiple lower extremity ulcers. His left Buttock wound is much lower smaller and almost closed completely. The wound to the left ankle has began to reopen with Epithelialization and some adherent slough. He has multiple new areas to the left foot and leg. The left dorsal foot without much improvement. Wound present between left great webspace and 2nd toe. Erythema and edema present right leg. Right LE ultrasound obtained on 10/10/18 was negative for DVT . 10/23/2018; Left buttock is closed over. Still dry macerated skin but there is no open wound. I suspect this is chronic pressure/moisture Left lateral calf is quite a bit worse than when I saw this last. There is clearly drainage here he has macerated skin into the left plantar heel. We will change the primary dressing to alginate Left dorsal foot has some improvement in overall wound area. Still using clindamycin and silver alginate Right dorsal foot about the same as the left using clindamycin and silver alginate The erythema in the right leg has resolved. He is DVT rule out was negative Left heel pressure area required debridement although the wound is smaller and the surface is health 10/26/2018 The patient came back in for his nurse check today predominantly because of the drainage coming out of the left lateral leg with a recent reopening of his original wound on the left lateral calf. He comes in today with a large amount of surrounding erythema around the wound extending from the calf into the ankle and even in the area on the dorsal foot. He is not systemically unwell. He is not febrile. Nevertheless this looks like cellulitis. We have been using silver alginate to the area. I changed him to a regular visit and I am going to prescribe him doxycycline. The rationale here is a long list of medication intolerances and a history of MRSA. I did not see anything  that I thought would provide a valuable culture 10/30/2018 Follow-up from his appointment 4 days ago with really an extensive area of cellulitis in the left calf left lateral ankle and left dorsal foot. I put him on doxycycline. He has a long list of medication allergies  which are true allergy reactions. 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 In any case he is a lot better today. The erythema especially in the anterior and lateral left calf is better. He still has left ankle erythema. He also is complaining about increasing edema in the right leg we have only been using Kerlix Coban and he has been doing the wraps at home. Finally he has a spotty rash on the medial part of his upper left calf which looks like folliculitis or perhaps wrap occlusion type injury. Small superficial macules not pustules 11/06/18 patient arrives today with again a considerable degree of erythema around the wound on the left lateral calf extending into the dorsal ankle and dorsal foot. This is a lot worse than when I saw this last week. He is on doxycycline really with not a lot of improvement. He has not been systemically unwell Wounds on the; left heel actually looks improved. Original area on the left foot and proximity to the first and second toes looks about the same. He has superficial areas on the dorsal foot, anterior calf and then the reopening of his original wound on the left lateral calf which looks about the same The only area he has on the right is the dorsal webspace first and second which is smaller. He has a large area of dry erythematous skin on the left buttock small open area here. 11/13/2018; the patient arrives in much better condition. The erythema around the wound on the left lateral calf is a lot better. 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 In any case this is a lot better. The area on the left heel is very small and just about resolved using silver collagen we have been using silver alginate to the areas on his dorsal feet 11/20/2018; his wounds include the left lateral calf, left heel, dorsal aspects of both feet just proximal to the first second webspace. He is stable to slightly improved. I did not think any changes to his dressings were going to be necessary 11/27/2018 he has a reopening on the left buttock which is surrounded by what looks like tinea or perhaps some other form of dermatitis. The area on the left dorsal foot has some erythema around it I have marked this area but I am not sure whether this is cellulitis or not. Left heel is not closed. Left calf the reopening is really slightly longer and probably worse 1/13; in general things look better and smaller except for the left dorsal foot. Area on the left heel is just about closed, left buttock looks better only a small wound remains in the skin looks better [using Lotrisone] 1/20; the area on the left heel only has a few remaining open areas here. Left lateral calf about the same in terms of size, left dorsal foot slightly larger right lateral foot still not closed. The area on the left buttock has no open wound and the surrounding skin looks a lot better 1/27; the area on the left heel is closed. Left lateral calf better but still requiring extensive debridements. The area on his left buttock is closed. He still has the open areas on the left dorsal foot which is slightly smaller in the right foot which is slightly expanded. We have been using Iodoflex on these areas as well 2/3; left heel is  closed. Left lateral calf still requiring debridement using Iodoflex there is no open area on his left buttock however he has dry scaly skin over a large area of this. Not really responding well to the Lotrisone. Finally the areas on his dorsal feet at the level of the  first second webspace are slightly smaller on the right and about the same on the left. Both of these vigorously debrided with Anasept and gauze 2/10; left heel remains closed he has dry erythematous skin over the left buttock but there is no open wound here. Left lateral leg has come in and with. Still requiring debridement we have been using Iodoflex here. Finally the area on the left dorsal foot and right dorsal foot are really about the same extremely dry callused fissured areas. He does not yet have a dermatology appointment 2/17; left heel remains closed. He has a new open area on the left buttock. The area on the left lateral calf is bigger longer and still covered in necrotic debris. No major change in his foot areas bilaterally. I am awaiting for a dermatologist to look on this. We have been using ketoconazole I do not know that this is been doing any good at all. 2/24; left heel remains closed. The left buttock wound that was new reopening last week looks better. The left lateral calf appears better also although still requires debridement. The major area on his foot is the left first second also requiring debridement. We have been putting Prisma on all wounds. I do not believe that the ketoconazole has done too much good for his feet. He will use Lotrisone I am going to give him a 2-week course of terbinafine. We still do not have a dermatology appointment 3/2 left heel remains closed however there is skin over bone in this area I pointed this out to him today. The left buttock wound is epithelialized but still does not look completely stable. The area on the left leg required debridement were using silver collagen here. With regards to his feet we changed to Lotrisone last week and silver alginate. 3/9; left heel remains closed. Left buttock remains closed. The area on the right foot is essentially closed. The left foot remains unchanged. Slightly smaller on the left lateral calf. Using  silver collagen to both of these areas 3/16-Left heel remains closed. Area on right foot is closed. Left lateral calf above the lateral malleolus open wound requiring debridement with easy bleeding. Left dorsal wound proximal to first toe also debrided. Left ischial area open new. Patient has been using Prisma with wrapping every 3 days. Dermatology appointment is apparently tomorrow.Patient has completed his terbinafine 2-week course with some apparent improvement according to him, there is still flaking and dry skin in his foot on the left 3/23; area on the right foot is reopened. The area on the left anterior foot is about the same still a very necrotic adherent surface. He still has the area on the left leg and reopening is on the left buttock. He apparently saw dermatology although I do not have a note. According to the patient who is usually fairly well informed they did not have any good ideas. Put him on oral terbinafine which she is been on before. 3/30; using silver collagen to all wounds. Apparently his dermatologist put him on doxycycline and rifampin presumably some culture grew staph. I do not have this result. He remains on terbinafine although I have used terbinafine on him before 4/6; patient has had  a fairly substantial reopening on the right foot between the first and second toes. He is finished his terbinafine and I believe is on doxycycline and rifampin still as prescribed by dermatology. We have been using silver collagen to all his wounds although the patient reports that he thinks silver alginate does better on the wounds on his buttock. 4/13; the area on his left lateral calf about the same size but it did not require debridement. Left dorsal foot just proximal to the webspace between the first and second toes is about the same. Still nonviable surface. I note some superficial bronze discoloration of the dorsal part of his foot Right dorsal foot just proximal to the first  and second toes also looks about the same. I still think there may be the same discoloration I noted above on the left Left buttock wound looks about the same 4/20; left lateral calf appears to be gradually contracting using silver collagen. 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 The left dorsal foot wound is debrided of tightly adherent necrotic debris and really cleans up quite nicely. The right area is worse with expansion. I did not debride this it is now over the base of the second toe The area on his left buttock is smaller no debridement is required using silver collagen 5/4; left calf continues to make good progress. He arrives with erythema around the wounds on his dorsal foot which even extends to the plantar aspect. Very concerning for coexistent infection. He is finished the erythromycin I gave him for possible erythrasma this does not seem to have helped. The area on the left foot is about the same base of the dorsal toes Is area on the buttock looks improved on the left 5/11; left calf and left buttock continued to make good progress. Left foot is about the same to slightly improved. Major problem is on the right foot. He has not had an x-ray. Deep tissue culture I did last week showed both Enterobacter and E. coli. I did not change the doxycycline I put him on empirically although neither 1 of these were plated to doxycycline. He arrives today with the erythema looking worse on both the dorsal and plantar foot. Macerated skin on the bottom of the foot. he has not been systemically unwell 5/18-Patient returns at 1 week, left calf wound appears to be making some progress, left buttock wound appears slightly worse than last time, left foot wound looks slightly better, right foot redness is marginally better. X-ray of both feet show no air or evidence of osteomyelitis. Patient is finished his Omnicef and terbinafine. He  continues to have macerated skin on the bottom of the left foot as well as right 5/26; left calf wound is better, left buttock wound appears to have multiple small superficial open areas with surrounding macerated skin. X-rays that I did last time showed no evidence of osteomyelitis in either foot. He is finished cefdinir and doxycycline. I do not think that he was on terbinafine. He continues to have a large superficial open area on the right foot anterior dorsal and slightly between the first and second toes. I did send him to dermatology 2 months ago or so wondering about whether they would do a fungal scraping. I do not believe they did but did do a culture. We have been using silver alginate to the toe areas, he has been using antifungals at home topically either ketoconazole or Lotrisone. We are using silver  collagen on the left foot, silver alginate on the right, silver collagen on the left lateral leg and silver alginate on the left buttock 6/1; left buttock area is healed. We have the left dorsal foot, left lateral leg and right dorsal foot. We are using silver alginate to the areas on both feet and silver collagen to the area on his left lateral calf 6/8; the left buttock apparently reopened late last week. He is not really sure how this happened. He is tolerating the terbinafine. Using silver alginate to all wounds 6/15; left buttock wound is larger than last week but still superficial. Came in the clinic today with a report of purulence from the left lateral leg I did not identify any infection Both areas on his dorsal feet appear to be better. He is tolerating the terbinafine. Using silver alginate to all wounds 6/22; left buttock is about the same this week, left calf quite a bit better. His left foot is about the same however he comes in with erythema and warmth in the right forefoot once again. Culture that I gave him in the beginning of May showed Enterobacter and E. coli. I gave him  doxycycline and things seem to improve although neither 1 of these organisms was specifically plated. 6/29; left buttock is larger and dry this week. Left lateral calf looks to me to be improved. Left dorsal foot also somewhat improved right foot completely unchanged. The erythema on the right foot is still present. He is completing the Ceftin dinner that I gave him empirically [see discussion above.) 7/6 - All wounds look to be stable and perhaps improved, the left buttock wound is slightly smaller, per patient bleeds easily, completed ceftin, the right foot redness is less, he is on terbinafine 7/13; left buttock wound about the same perhaps slightly narrower. Area on the left lateral leg continues to narrow. Left dorsal foot slightly smaller right foot about the same. We are using silver alginate on the right foot and Hydrofera Blue to the areas on the left. Unna boot on the left 2 layer compression on the right 7/20; left buttock wound absolutely the same. Area on lateral leg continues to get better. Left dorsal foot require debridement as did the right no major change in the 7/27; left buttock wound the same size necrotic debris over the surface. The area on the lateral leg is closed once again. His left foot looks better right foot about the same although there is some involvement now of the posterior first second toe area. He is still on terbinafine which I have given him for a month, not certain a centimeter major change 06/25/19-All wounds appear to be slightly improved according to report, left buttock wound looks clean, both foot wounds have minimal to no debris the right dorsal foot has minimal slough. We are using Hydrofera Blue to the left and silver alginate to the right foot and ischial wound. 8/10-Wounds all appear to be around the same, the right forefoot distal part has some redness which was not there before, however the wound looks clean and small. Ischial wound looks about the  same with no changes 8/17; his wound on the left lateral calf which was his original chronic venous insufficiency wound remains closed. Since I last saw him the areas on the left dorsal foot right dorsal foot generally appear better but require debridement. The area on his left initial tuberosity appears somewhat larger to me perhaps hyper granulated and bleeds very easily. We have been  using Hydrofera Blue to the left dorsal foot and silver alginate to everything else 8/24; left lateral calf remains closed. The areas on his dorsal feet on the webspace of the first and second toes bilaterally both look better. The area on the left buttock which is the pressure ulcer stage II slightly smaller. I change the dressing to Hydrofera Blue to all areas 8/31; left lateral calf remains closed. The area on his dorsal feet bilaterally look better. Using Hydrofera Blue. Still requiring debridement on the left foot. No change in the left buttock pressure ulcers however 9/14; left lateral calf remains closed. Dorsal feet look quite a bit better than 2 weeks ago. Flaking dry skin also a lot better with the ammonium lactate I gave him 2 weeks ago. The area on the left buttock is improved. He states that his Roho cushion developed a leak and he is getting a new one, in the interim he is offloading this vigorously 9/21; left calf remains closed. Left heel which was a possible DTI looks better this week. He had macerated tissue around the left dorsal foot right foot looks satisfactory and improved left buttock wound. I changed his dressings to his feet to silver alginate bilaterally. Continuing Hydrofera Blue on the left buttock. 9/28 left calf remains closed. Left heel did not develop anything [possible DTI] dry flaking skin on the left dorsal foot. Right foot looks satisfactory. Improved left buttock wound. We are using silver alginate on his feet Hydrofera Blue on the buttock. I have asked him to go back to the  Lotrisone on his feet including the wounds and surrounding areas 10/5; left calf remains closed. The areas on the left and right feet about the same. A lot of this is epithelialized however debris over the remaining open areas. He is using Lotrisone and silver alginate. The area on the left buttock using Hydrofera Blue 10/26. Patient has been out for 3 weeks secondary to Covid concerns. He tested negative but I think his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 veins bilaterally. Ultimately he is going to need to see vascular surgery about this issue. Hopefully when we can get his wounds and a little better shape. 02/19/16; the patient was able to complete a course of [place]'s for MRSA in the face of multiple antibiotic allergies. Arterial studies showed an ABI of him 0.88 on the right 1.17 on the left the. Waveforms were biphasic at the posterior tibial and dorsalis pedis digital waveforms were normal. Right toe brachial index was 1.3 limited by shaking and edema. His venous study showed  widespread reflux in the left at the common femoral vein the greater and lesser saphenous vein the greater and lesser saphenous vein on the right as well as the popliteal and femoral vein. The popliteal and femoral vein on the left did not show reflux. His wounds on the right leg give healed on the left he is still using Santyl. 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 The erythema has not really resolved and I wonder if this is mostly venous inflammation rather than cellulitis. Still using Santyl. He is approved for Apligraf 03/04/16; there is less erythema around the wound. Both wounds require aggressive surgical debridement. Not yet ready for Apligraf 03/11/16; aggressive debridement again. Not ready for Apligraf 03/18/16 aggressive debridement again. 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 Has been to see vascular surgery he is being planned for a venous ablation 03/25/16; aggressive debridement again of both wound areas on the left lateral leg. He is due for ablation surgery on May 22. He is much closer to being ready for an Apligraf. Has a new area between the left first and second toes 04/01/16 aggressive debridement done of both wounds. The new wound at the base of between his second and first toes looks stable 04/08/16; continued aggressive debridement of both wounds on the left lower leg. He goes for his venous ablation on Monday. The new wound at the base of his first and second toes dorsally appears stable. 04/15/16; wounds aggressively debridement although the base of this looks considerably better Apligraf #1. He had ablation surgery on Monday I'll need to research these records. We only have approval for four Apligraf's 04/22/16; the patient is here for a wound check [Apligraf last week] intake nurse concerned about erythema around the wounds. Apparently a significant degree of drainage. The patient has chronic venous  inflammation which I think accounts for most of this however I was asked to look at this today 04/26/16; the patient came back for check of possible cellulitis in his left foot however  the Apligraf dressing was inadvertently removed therefore we elected to prep the wound for a second Apligraf. I put him on doxycycline on 6/1 the erythema in the foot 05/03/16 we did not remove the dressing from the superior wound as this is where I put all of his last Apligraf. Surface debridement done with a curette of the lower wound which looks very healthy. The area on the left foot also looks quite satisfactory at the dorsal artery at the first and second toes 05/10/16; continue Apligraf to this. Her wound, Hydrafera to the lower wound. He has a new area on the right second toe. Left dorsal foot firstoosecond toe also looks improved 05/24/16; wound dimensions must be smaller I was able to use Apligraf to all 3 remaining wound areas. 06/07/16 patient's last Apligraf was 2 weeks ago. He arrives today with the 2 wounds on his lateral left leg joined together. This would have to be seen as a negative. He also has a small wound in his first and second toe on the left dorsally with quite a bit of surrounding erythema in the first second and third toes. This looks to be infected or inflamed, very difficult clinical call. 06/21/16: lateral left leg combined wounds. Adherent surface slough area on the left dorsal foot at roughly the fourth toe looks improved 07/12/16; he now has a single linear wound on the lateral left leg. This does not look to be a lot changed from when I lost saw this. The area on his dorsal left foot looks considerably better however. 08/02/16; no major change in the substantial area on his left lateral leg since last time. We have been using Hydrofera Blue for a prolonged period of time now. The area on his left foot is also unchanged from last review 07/19/16; the area on his dorsal foot on the left looks  considerably smaller. He is beginning to have significant rims of epithelialization on the lateral left leg wound. This also looks better. 08/05/16; the patient came in for a nurse visit today. Apparently the area on his left lateral leg looks better and it was wrapped. However in general discussion the patient noted a new area on the dorsal aspect of his right second toe. The exact etiology of this is unclear but likely relates to pressure. 08/09/16 really the area on the left lateral leg did not really look that healthy today perhaps slightly larger and measurements. The area on his dorsal right second toe is improved also the left foot wound looks stable to improved 08/16/16; the area on the last lateral leg did not change any of dimensions. Post debridement with a curet the area looked better. Left foot wound improved and the area on the dorsal right second toe is improved 08/23/16; the area on the left lateral leg may be slightly smaller both in terms of length and width. Aggressive debridement with a curette afterwards the tissue appears healthier. Left foot wound appears improved in the area on the dorsal right second toe is improved 08/30/16 patient developed a fever over the weekend and was seen in an urgent care. Felt to have a UTI and put on doxycycline. He has been since changed over the phone to [facility]. After we took off the wrap on his right leg today the leg is swollen warm and erythematous, probably more likely the source of the fever 09/06/16; have been using collagen to the major left leg wound, silver alginate to the area on his anterior foot/toes 09/13/16; the  areas on his anterior foot/toes on both sides appear to be virtually closed. Extensive wound on the left lateral leg perhaps slightly narrower but each visit still covered an adherent surface slough 09/16/16 patient was in for his usual Thursday nurse visit however the intake nurse noted significant erythema of his dorsal right  foot. He is also running a low- grade fever and having increasing spasms in the right leg 09/20/16 here for cellulitis involving his right great toes and forefoot. This is a lot better. Still requiring debridement on his left lateral leg. Santyl direct says he needs prior authorization. Therefore his wife cannot change this at home 09/30/16; the patient's extensive area on the left lateral calf and ankle perhaps somewhat better. Using Santyl. The area on the left toes is healed and I think the area on his right dorsal foot is healed as well. There is no cellulitis or venous inflammation involving the right leg. He is going to need compression stockings here. 10/07/16; the patient's extensive wound on the left lateral calf and ankle does not measure any differently however there appears to be less adherent surface slough using Santyl and aggressive weekly debridements 10/21/16; no major change in the area on the left lateral calf. Still the same measurement still very difficult to debridement adherent slough and nonviable subcutaneous tissue. This is not really been helped by several weeks of Santyl. Previously for 2 weeks I used Iodoflex for a short period. A prolonged course of Hydrofera Blue didn't really help. I'm not sure why I only used 2 weeks of Iodoflex on this there is no evidence of surrounding infection. He has a small area on the right second toe which looks as though it's progressing towards closure 10/28/16; the wounds on his toes appear to be closed. No major change in the left lateral leg wound although the surface looks somewhat better using Iodoflex. He has had previous arterial studies that were normal. He has had reflux studies and is status post ablation although I don't have any exact notes on which vein was ablated. I'll need to check the surgical record 11/04/16; he's had a reopening between the first and second toe on the left and right. No major change in the left lateral leg  wound. There is what appears to be cellulitis of the left dorsal foot 11/18/16 the patient was hospitalized initially in [place] and then subsequently transferred to [facility] and was admitted there from 11/09/16 through 11/12/16. He had developed progressive cellulitis on the right leg in spite of the doxycycline I gave him. I'd spoken to the hospitalist in [place] who was concerned about continuing leukocytosis. CT scan is what I suggested this was done which showed soft tissue swelling without evidence of osteomyelitis or an underlying abscess blood cultures were negative. At [facility] he was treated with vancomycin and Primaxin and then add an infectious disease consult. He was transitioned to Ceftaroline. He has been making progressive improvement. Overall a severe cellulitis of the right leg. He is been using silver alginate to her original wound on the left leg. The wounds in his toes on the right are closed there is a small open area on the base of the left second toe 11/26/15; the patient's right leg is much better although there is still some edema here this could be reminiscent from his severe cellulitis likely on top of some degree of lymphedema. His left anterior leg wound has less surface slough as reported by her intake nurse. Small wound  at the base of the left second toe 12/02/16; patient's right leg is better and there is no open wound here. His left anterior lateral leg wound continues to have a healthy-looking surface. Small wound at the base of the left second toe however there is erythema in the left forefoot which is worrisome 12/16/16; is no open wounds on his right leg. We took measurements for stockings. His left anterior lateral leg wound continues to have a healthy-looking surface. I'm not sure where we were with the Apligraf run through his insurance. We have been using Iodoflex. He has a thick eschar on the left first second toe interface, I suspect this may be fungal  however there is no visible open 12/23/16; no open wound on his right leg. He has 2 small areas left of the linear wound that was remaining last week. We have been using Prisma, I thought I have disclosed this week, we can only look forward to next week 01/03/17; the patient had concerning areas of erythema last week, already on doxycycline for UTI through his primary doctor. The erythema is absolutely no better there is warmth and swelling both medially from the left lateral leg wound and also the dorsal left foot. 01/06/17- Patient is here for follow-up evaluation of his left lateral leg ulcer and bilateral feet ulcers. He is on oral antibiotic therapy, tolerating that. Nursing staff and the patient states that the erythema is improved from Monday. 01/13/17; the predominant left lateral leg wound continues to be problematic. I had put Apligraf on him earlier this month once. However he subsequently developed what appeared to be an intense cellulitis around the left lateral leg wound. I gave him Dalvance I think on 2/12 perhaps 2/13 he continues on cefdinir. The erythema is still present but the warmth and swelling is improved. I am hopeful that the cellulitis part of this control. I wouldn't be surprised if there is an element of venous inflammation as well. 01/17/17. The erythema is present but better in the left leg. His left lateral leg wound still does not have a viable surface buttons certain parts of this long thin wound it appears like there has been improvement in dimensions. 01/20/17; the erythema still present but much better in the left leg. I'm thinking this is his usual degree of chronic venous inflammation. The wound on the left leg looks somewhat better. Is less surface slough 01/27/17; erythema is back to the chronic venous inflammation. The wound on the left leg is somewhat better. I am back to the point where I like to try an Apligraf once again 02/10/17; slight improvement in wound  dimensions. Apligraf #2. He is completing his doxycycline 02/14/17; patient arrives today having completed doxycycline last Thursday. This was supposed to be a nurse visit however once again he hasn't tense erythema from the medial part of his wound extending over the lower leg. Also erythema in his foot this is roughly in the same distribution as last time. He has baseline chronic venous inflammation however this is a lot worse than the baseline I have learned to accept the on him is baseline inflammation 02/24/17- patient is here for follow-up evaluation. He is tolerating compression therapy. His voicing no complaints or concerns he is here anticipating an Apligraf 03/03/17; he arrives today with an adherent necrotic surface. I don't think this is surface is going to be amenable for Apligraf's. The erythema around his wound and on the left dorsal foot has resolved he is off antibiotics 03/10/17;  better-looking surface today. I don't think he can tolerate Apligraf's. He tells me he had a wound VAC after a skin graft years ago to this area and they had difficulty with a seal. The erythema continues to be stable around this some degree of chronic venous inflammation but he also has recurrent cellulitis. We have been using Iodoflex 03/17/17; continued improvement in the surface and may be small changes in dimensions. Using Iodoflex which seems the only thing that will control his surface 03/24/17- He is here for follow up evaluation of his LLE lateral ulceration and ulcer to right dorsal foot/toe space. He is voicing no complaints or concerns, He is tolerating compression wrap. 03/31/17 arrives today with a much healthier looking wound on the left lower extremity. We have been using Iodoflex for a prolonged period of time which has for the first time prepared and adequate looking wound bed although we have not had much in the way of wound dimension improvement. He also has a small wound between the first and  second toe on the right 04/07/17; arrives today with a healthy-looking wound bed and at least the top 50% of this wound appears to be now her. No debridement was required I have changed him to [facility] last week after prolonged Iodoflex. He did not do well with Apligraf's. We've had a re-opening between the first and second toe on the right 04/14/17; arrives today with a healthier looking wound bed contractions and the top 50% of this wound and some on the lesser 50%. Wound bed appears healthy. The area between the first and second toe on the right still remains problematic 04/21/17; continued very gradual improvement. Using [facility] 04/28/17; continued very gradual improvement in the left lateral leg venous insufficiency wound. His periwound erythema is very mild. We have been using Hydrofera Blue. Wound is making progress especially in the superior 50% 05/05/17; he continues to have very gradual improvement in the left lateral venous insufficiency wound. Both in terms with an length rings are improving. I debrided this every 2 weeks with #5 curet and we have been using Hydrofera Blue and again making good progress With regards to the wounds between his right first and second toe which I thought might of been tinea pedis he is not making as much progress very dry scaly skin over the area. Also the area at the base of the left first and second toe in a similar condition 05/12/17; continued gradual improvement in the refractory left lateral venous insufficiency wound on the left. Dimension smaller. Surface still requiring debridement using Hydrofera Blue 05/19/17; continued gradual improvement in the refractory left lateral venous ulceration. Careful inspection of the wound bed underlying rumination suggested some degree of epithelialization over the surface no debridement indicated. Continue Hydrofera Blue difficult areas between his toes first and third on the left than first and second on the  right. I'm going to change to silver alginate from silver collagen. Continue ketoconazole as I suspect underlying tinea pedis 05/26/17; left lateral leg venous insufficiency wound. We've been using Hydrofera Blue. I believe that there is expanding epithelialization over the surface of the wound albeit not coming from the wound circumference. This is a bit of an odd situation in which the epithelialization seems to be coming from the surface of the wound rather than in the exact circumference. There is still small open areas mostly along the lateral margin of the wound. ooHe has unchanged areas between the left first and second and the right  first second toes which I been treating for tenia pedis 06/02/17; left lateral leg venous insufficiency wound. We have been using Hydrofera Blue. Somewhat smaller from the wound circumference. The surface of the wound remains a bit on it almost epithelialized sedation in appearance. I use an open curette today debridement in the surface of all of this especially the edges ooSmall open wounds remaining on the dorsal right first and second toe interspace and the plantar left first second toe and her face on the left 06/09/17; wound on the left lateral leg continues to be smaller but very gradual and very dry surface using Hydrofera Blue 06/16/17 requires weekly debridements now on the left lateral leg although this continues to contract. I changed to silver collagen last week because of dryness of the wound bed. Using Iodoflex to the areas on his first and second toes/web space bilaterally 06/24/17; patient with history of paraplegia also chronic venous insufficiency with lymphedema. Has a very difficult wound on the left lateral leg. This has been gradually reducing in terms of with but comes in with a very dry adherent surface. High switch to silver collagen a week or so ago with hydrogel to keep the area moist. This is been refractory to multiple dressing attempts. He  also has areas in his first and second toes bilaterally in the anterior and posterior web space. I had been using Iodoflex here after a prolonged course of silver alginate with ketoconazole was ineffective [question tinea pedis] 07/14/17; patient arrives today with a very difficult adherent material over his left lateral lower leg wound. He also has surrounding erythema and poorly controlled edema. He was switched his Santyl last visit which the nurses are applying once during his doctor visit and once on a nurse visit. He was also reduced to 2 layer compression I'm not exactly sure of the issue here. 07/21/17; better surface today after 1 week of Iodoflex. Significant cellulitis that we treated last week also better. [Doxycycline] 07/28/17 better surface today with now 2 weeks of Iodoflex. Significant cellulitis treated with doxycycline. He has now completed the doxycycline and he is back to his usual degree of chronic venous inflammation/stasis dermatitis. He reminds me he has had ablations surgery here 08/04/17; continued improvement with Iodoflex to the left lateral leg wound in terms of the surface of the wound although the dimensions are better. 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 Problematic areas on the plantar aspect of the first second toe web space on the left and the dorsal aspect of the first second toe web space on the right. At one point I felt these were probably related to chronic fungal infections in treated him aggressively for this although we have not made any improvement here. 08/11/17; left lateral leg. Surface continues to improve with the Iodoflex although we are not seeing much improvement in overall wound dimensions. Areas on his plantar left foot and right foot show no improvement. In fact the right foot looks somewhat worse 08/18/17; left lateral leg. We changed to [facility] Blue last week after a  prolonged course of Iodoflex which helps get the surface better. It appears that the wound with is improved. Continue with difficult areas on the left dorsal first second and plantar first second on the right 09/01/17; patient arrives in clinic today having had a temperature of 103 yesterday. He was seen in the ER and [facility]. The patient was concerned he could have cellulitis again  in the right leg however they diagnosed him with a UTI and he is now on Keflex. He has a history of cellulitis which is been recurrent and difficult but this is been in the left leg, in the past 5 use doxycycline. He does in and out catheterizations at home which are risk factors for UTI 09/08/17; patient will be completing his Keflex this weekend. The erythema on the left leg is considerably better. He has a new wound today on the medial part of the right leg small superficial almost looks like a skin tear. He has worsening of the area on the right dorsal first and second toe. His major area on the left lateral leg is better. Using Hydrofera Blue on all areas 09/15/17; gradual reduction in width on the long wound in the left lateral leg. No debridement required. He also has wounds on the plantar aspect of his left first second toe web space and on the dorsal aspect of the right first second toe web space. 09/22/17; there continues to be very gradual improvements in the dimensions of the left lateral leg wound. He hasn't round erythematous spot with might be pressure on his wheelchair. There is no evidence obviously of infection no purulence no warmth ooHe has a dry scaled area on the plantar aspect of the left first second toe ooImproved area on the dorsal right first second toe. 09/29/17; left lateral leg wound continues to improve in dimensions mostly with an is still a fairly long but increasingly narrow wound. ooHe has a dry scaled area on the plantar aspect of his left first second toe web space ooIncreasingly  concerning area on the dorsal right first second toe. In fact I am concerned today about possible cellulitis around this wound. The areas extending up his second toe and although there is deformities here almost appears to abut on the nailbed. 10/06/17; left lateral leg wound continues to make very gradual progress. Tissue culture I did from the right first second toe dorsal foot last time grew MRSA and enterococcus which was vancomycin sensitive. This was not sensitive to clindamycin or doxycycline. He is allergic to Zyvox and sulfa we have therefore arrange for him to have dalvance infusion tomorrow. He is had this in the past and tolerated it well 10/20/17; left lateral leg wound continues to make decent progress. This is certainly reduced in terms of with there is advancing epithelialization.ooThe cellulitis in the right foot looks better although he still has a deep wound in the dorsal aspect of the first second toe web space. Plantar left first toe web space on the left I think is making some progress 10/27/17; left lateral leg wound continues to make decent progress. Advancing epithelialization.using Hydrofera Blue ooThe right first second toe web space wound is better-looking using silver alginate ooImprovement in the left plantar first second toe web space. Again using silver alginate 11/03/17 left lateral leg wound continues to make decent progress albeit slowly. Using Hydrofera Blue ooThe right per second toe web space continues to be a very problematic looking punched out wound. I obtained a piece of tissue for deep culture I did extensively treated this for fungus. It is difficult to imagine that this is a pressure area as the patient states other than going outside he doesn't really wear shoes at home ooThe left plantar first second toe web space looked fairly senescent. Necrotic edges. This required debridement oochange to Hydrofera Blue to all wound areas 11/10/17; left lateral  leg wound continues to  contract. Using Hydrofera Blue ooOn the right dorsal first second toe web space dorsally. Culture I did of this area last week grew MRSA there is not an easy oral option in this patient was multiple antibiotic allergies or intolerances. This was only a rare culture isolate I'm therefore going to use Bactroban under silver alginate ooOn the left plantar first second toe web space. Debridement is required here. This is also unchanged 11/17/17; left lateral leg wound continues to contract using Hydrofera Blue this is no longer the major issue. ooThe major concern here is the right first second toe web space. He now has an open area going from dorsally to the plantar aspect. There is now wound on the inner lateral part of the first toe. Not a very viable surface on this. There is erythema spreading medially into the forefoot. ooNo major change in the left first second toe plantar wound 11/24/17; left lateral leg wound continues to contract using Hydrofera Blue. Nice improvement today ooThe right first second toe web space all of this looks a lot less angry than last week. I have given him clindamycin and topical Bactroban for MRSA and terbinafine for the possibility of underlining tinea pedis that I could not control with ketoconazole. Looks somewhat better ooThe area on the plantar left first second toe web space is weeping with dried debris around the wound 12/01/17; left lateral leg wound continues to contract he Hydrofera Blue. It is becoming thinner in terms of with nevertheless it is making good improvement. ooThe right first second toe web space looks less angry but still a large necrotic-looking wounds starting on the plantar aspect of the right foot extending between the toes and now extensively on the base of the right second toe. I gave him clindamycin and topical Bactroban for MRSA anterior benefiting for the possibility of underlying tinea pedis. Not looking better  today ooThe area on the left first/second toe looks better. Debrided of necrotic debris 12/05/17* the patient was worked in urgently today because over the weekend he found blood on his incontinence bad when he woke up. He was found to have an ulcer by his wife who does most of his wound care. He came in today for [place] to look at this. He has not had a history of wounds in his buttocks in spite of his paraplegia. 12/08/17; seen in follow-up today at his usual appointment. He was seen earlier this week and found to have a new wound on his buttock. We also follow him for wounds on the left lateral leg, left first second toe web space and right first second toe web space 12/15/17; we have been using Hydrofera Blue to the left lateral leg which has improved. The right first second toe web space has also improved. Left first second toe web space plantar aspect looks stable. The left buttock has worsened using Santyl. Apparently the buttock has drainage 12/22/17; we have been using Hydrofera Blue to the left lateral leg which continues to improve now 2 small wounds separated by normal skin. He tells [place] he had a fever up to 100 yesterday he is prone to UTIs but has not noted anything different. He does in and out catheterizations. The area between the first and second toes today does not look good necrotic surface covered with what looks to be purulent drainage and erythema extending into the third toe. I had gotten this to something that I thought look better last time however it is not look good today.  He also has a necrotic surface over the buttock wound which is expanded. I thought there might be infection under here so I removed a lot of the surface with a #5 curet though nothing look like it really needed culturing. He is been using Santyl to this area 12/27/17; his original wound on the left lateral leg continues to improve using Hydrofera Blue. I gave him samples of Baxdella although he was unable to  take them out of fear for an allergic reaction ["lump in his throat"].the culture I did of the purulent drainage from his second toe last week showed both enterococcus and a set Enterobacter I was also concerned about the erythema on the bottom of his foot although paradoxically although this looks somewhat better today. 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 We've been using silver alginate here. They came up today that he sleeps in a recliner, I'm not sure why but I asked him to stop this 01/03/18; his original wound we've been using Hydrofera Blue is now separated into 2 areas. ooUlcer on his left buttock is better he is off the recliner and sleeping in bed ooFinally both wound areas between his first and second toes also looks some better 01/10/18; his original wound on the left lateral leg is now separated into 2 wounds we've been using Hydrofera Blue ooUlcer on his left buttock has some drainage. There is a small probing site going into muscle layer superiorly.using silver alginate -He arrives today with a deep tissue injury on the left heel ooThe wound on the dorsal aspect of his first second toe on the left looks a lot betterusing silver alginate ketoconazole ooThe area on the first second toe web space on the right also looks a lot bette 01/17/18; his original wound on the left lateral leg continues to progress using Hydrofera Blue ooUlcer on his left buttock also is smaller surface healthier except for a small probing site going into the muscle layer superiorly. 2.4 cm of tunneling in this area ooDTI on his left heel we have only been offloading. Looks better than last week no threatened open no evidence of infection oothe wound on the dorsal aspect of the first second toe on the left continues to look like it's regressing we have only been using silver alginate and  terbinafine orally ooThe area in the first second toe web space on the right also looks to be a lot better using silver alginate and terbinafine I think this was prompted by tinea pedis 01/31/18; the patient was hospitalized in [place] last week apparently for a complicated UTI. He was discharged on cefepime he does in and out catheterizations. In the hospital he was discovered M I don't mild elevation of AST and ALT and the terbinafine was stopped.predictably the pressure ulcer on s his buttock looks betterusing silver alginate. The area on the left lateral leg also is better using Hydrofera Blue. The area between the first and second toes on the left better. First and second toes on the right still substantial but better. Finally the DTI on the left heel has held together and looks like it's resolving 02/07/18-he is here in follow-up evaluation for multiple ulcerations. He has new injury to the lateral aspect of the last issue a pressure ulcer, he states this is from adhesive removal trauma. He states he has tried multiple adhesive products with no success. All other ulcers appear stable. The  left heel DTI is resolving. We will continue with same treatment plan and follow-up next week. 02/14/18; follow-up for multiple areas. ooHe has a new area last week on the lateral aspect of his pressure ulcer more over the posterior trochanter. The original pressure ulcer looks quite stable has healthy granulation. 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 Almost fully epithelialized on the original superior area using Hydrofera Blue ooDTI on the left heel has peeled off this week to reveal a small superficial wound under denuded skin and subcutaneous tissue ooBoth areas between the first and second toes look better including nothing open on the left 02/21/18; ooThe patient's wounds on his left  ischial tuberosity and posterior left greater trochanter actually looked better. He has a large area of irritation around the area which I think is contact dermatitis. I am doubtful that this is fungal ooHis original wound on the left lateral calf continues to improve we have been using Hydrofera Blue ooThere is no open area in the left first second toe web space although there is a lot of thick callus ooThe DTI on the left heel required debridement today of necrotic surface eschar and subcutaneous tissue using silver alginate ooFinally the area on the right first second toe webspace continues to contract using silver alginate and ketoconazole 02/28/18 ooLeft ischial tuberosity wounds look better using silver alginate. ooOriginal wound on the left calf only has one small open area left using Hydrofera Blue ooDTI on the left heel required debridement mostly removing skin from around this wound surface. Using silver alginate ooThe areas on the right first/second toe web space using silver alginate and ketoconazole 03/08/18 on evaluation today patient appears to be doing decently well as best I can tell in regard to his wounds. This is the first time that I have seen him as he generally is followed by Dr. Dellia Nims. With that being said none of his wounds appear to be infected he does have an area where there is some skin covering what appears to be a new wound on the left dorsal surface of his great toe. This is right at the nail bed. With that being said I do believe that debrided away some of the excess skin can be of benefit in this regard. Otherwise he has been tolerating the dressing changes without complication. 03/14/18; patient arrives today with the multiplicity of wounds that we are following. He has not been systemically unwell ooOriginal wound on the left lateral calf now only has 2 small open areas we've been using Hydrofera Blue which should continue ooThe deep tissue injury on the  left heel requires debridement today. We've been using silver alginate ooThe left first second toe and the right first second toe are both are reminiscence what I think was tinea pedis. Apparently some of the callus Surface between the toes was removed last week when it started draining. ooPurulent drainage coming from the wound on the ischial tuberosity on the left. 03/21/18-He is here in follow-up evaluation for multiple wounds. 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 He tolerated debridement. The only change to last week's recommendations is to discontinue antifungal cream between toes. He will follow-up next week 03/28/18; following up for multiple wounds;Concern this week is streaking redness and swelling in the right foot. He is going to need antibiotics for this. 03/31/18; follow-up for right foot  cellulitis. Streaking redness and swelling in the right foot on 03/28/18. 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 I put him on clindamycin 300 mg every 6 and brought him in for a quick check. He has an open wound between his first and second toes on the right foot as a potential source. 04/04/18; ooRight foot cellulitis is resolving he is completing clindamycin. This is truly good news ooLeft lateral calf wound which is initial wound only has one small open area inferiorly this is close to healing out. He has compression stockings. We will use Hydrofera Blue right down to the epithelialization of this ooNonviable surface on the left heel which was initially pressure with a DTI. We've been using Hydrofera Blue. I'm going to switch this back to silver alginate ooLeft first second toe/tinea pedis this looks better using silver alginate ooRight first second toe tinea pedis using silver alginate ooLarge pressure ulcers on theLeft ischial tuberosity. Small wound here Looks better. I am uncertain about  the surface over the large wound. Using silver alginate 04/11/18; ooCellulitis in the right foot is resolved ooLeft lateral calf wound which was his original wounds still has 2 tiny open areas remaining this is just about closed ooNonviable surface on the left heel is better but still requires debridement ooLeft first second toe/tinea pedis still open using silver alginate ooRight first second toe wound tinea pedis I asked him to go back to using ketoconazole and silver alginate ooLarge pressure ulcers on the left ischial tuberosity this shear injury here is resolved. Wound is smaller. No evidence of infection using silver alginate 04/18/18; ooPatient arrives with an intense area of cellulitis in the right mid lower calf extending into the right heel area. Bright red and warm. Smaller area on the left anterior leg. He has a significant history of MRSA. He will definitely need antibioticsoodoxycycline ooHe now has 2 open areas on the left ischial tuberosity the original large wound and now a satellite area which I think was above his initial satellite areas. Not a wonderful surface on this satellite area surrounding erythema which looks like pressure related. ooHis left lateral calf wound again his original wound is just about closed ooLeft heel pressure injury still requiring debridement ooLeft first second toe looks a lot better using silver alginate ooRight first second toe also using silver alginate and ketoconazole cream also looks better 04/20/18; the patient was worked in early today out of concerns with his cellulitis on the right leg. I had started him on doxycycline. This was 2 days ago. His wife was concerned about the swelling in the area. Also concerned about the left buttock. He has not been systemically unwell no fever chills. No nausea vomiting or diarrhea 04/25/18; the patient's left buttock wound is continued to deteriorate he is using Hydrofera Blue. He is still  completing clindamycin for the cellulitis on the right leg although all of this looks better. 05/02/18 ooLeft buttock wound still with a lot of drainage and a very tightly adherent fibrinous necrotic surface. He has a deeper area superiorly ooThe left lateral calf wound is still closed ooDTI wound on the left heel necrotic surface especially the circumference using Iodoflex ooAreas between his left first second toe and right first second toe both look better. Dorsally and the right first second toe he had a necrotic surface although at smaller. In using silver alginate and ketoconazole. I did a culture last week which was a deep tissue culture of the reminiscence of the  open wound on the right first second toe dorsally. This grew a few Acinetobacter and a few methicillin-resistant staph aureus. Nevertheless the area actually this week looked better. I didn't feel the need to specifically address this at least in terms of systemic antibiotics. 05/09/18; wounds are measuring larger more drainage per our intake. We are using Santyl covered with alginate on the large superficial buttock wounds, Iodosorb on the left heel, ketoconazole and silver alginate to the dorsal first and second toes bilaterally. 05/16/18; ooThe area on his left buttock better in some aspects although the area superiorly over the ischial tuberosity required an extensive debridement.using Santyl ooLeft heel appears stable. Using Iodoflex ooThe areas between his first and second toes are not bad however there is spreading erythema up the dorsal aspect of his left foot this looks like cellulitis again. He is insensate the erythema is really very brilliant.o Erysipelas He went to see an allergist days ago because he was itching part of this he had lab work done. This showed a white count of 15.1 with 70% neutrophils. Hemoglobin of 11.4 and a platelet count of 659,000. Last white count we had in Epic was a 2-1/2 years ago which  was 25.9 but he was ill at the time. He was able to show me some lab work that was done by his primary physician the pattern is about the same. 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 But prompted me to go ahead and do x-rays of both feet and the pelvis rule out osteomyelitis. He also had a comprehensive metabolic panel this was reasonably normal his albumin was 3.7 liver function tests BUN/creatinine all normal 05/23/18; x-rays of both his feet from last week were negative for underlying pulmonary abnormality. The x-ray of his pelvis however showed mild irregularity in the left ischial which may represent some early osteomyelitis. The wound in the left ischial continues to get deeper clearly now exposed muscle. Each week necrotic surface material over this area. Whereas the rest of the wounds do not look so bad. ooThe left ischial wound we have been using Santyl and calcium alginate ooT the left heel surface necrotic debris using Iodoflex o ooThe left lateral leg is still healed ooAreas on the left dorsal foot and the right dorsal foot are about the same. There is some inflammation on the left which might represent contact dermatitis, fungal dermatitis I am doubtful cellulitis although this looks better than last week 05/30/18; CT scan done at Hospital did not show any osteomyelitis or abscess. Suggested the possibility of underlying cellulitis although I don't see a lot of evidence of this at the bedside ooThe wound itself on the left buttock/upper thigh actually looks somewhat better. No debridement ooLeft heel also looks better no debridement continue Iodoflex ooBoth dorsal first second toe spaces appear better using Lotrisone. Left still required debridement 06/06/18; ooIntake reported some purulent looking drainage from the left gluteal wound. Using Santyl and calcium alginate ooLeft heel looks better although still a nonviable surface  requiring debridement ooThe left dorsal foot first/second webspace actually expanding and somewhat deeper. I may consider doing a shave biopsy of this area ooRight dorsal foot first/second webspace appears stable to improved. Using Lotrisone and silver alginate to both these areas 06/13/18 ooLeft gluteal surface looks better. Now separated in the 2 wounds. No debridement required. Still drainage. We'll continue silver alginate ooLeft heel continues to look better with Iodoflex continue this for at least another  week ooOf his dorsal foot wounds the area on the left still has some depth although it looks better than last week. We've been using Lotrisone and silver alginate 06/20/18 ooLeft gluteal continues to look better healthy tissue ooLeft heel continues to look better healthy granulation wound is smaller. He is using Iodoflex and his long as this continues continue the Iodoflex ooDorsal right foot looks better unfortunately dorsal left foot does not. There is swelling and erythema of his forefoot. He had minor trauma to this several days ago but doesn't think this was enough to have caused any tissue injury. Foot looks like cellulitis, we have had this problem before 06/27/18 on evaluation today patient appears to be doing a little worse in regard to his foot ulcer. 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 Both of which would really be his only options for treating this infection. In the past he has been given and effusion of Orbactiv. This is done very well for him in the past again it's one time dosing IV antibiotic therapy. Subsequently I do believe this is something we're gonna need to see about doing at this point in time. Currently his other wounds seem to be doing somewhat better in my pinion I'm pretty happy in that regard. 07/03/18 on  evaluation today patient's wounds actually appear to be doing fairly well. He has been tolerating the dressing changes without complication. All in all he seems to be showing signs of improvement. In regard to the antibiotics he has been dealing with infectious disease since I saw him last week as far as getting this scheduled. In the end [facility]'s [facility] this coming Friday. In the meantime he has been continuing to perform the dressing changes in such as previous. There does not appear to be any evidence of infection worsengin at this time. 07/10/18; ooSince I last saw this man 2 weeks ago things have actually improved. IV antibiotics of resulted in less forefoot erythema although there is still some present. He is not systemically unwell ooLeft buttock wounds o2 now have no depth there is increased epithelialization Using silver alginate ooLeft heel still requires debridement using Iodoflex ooLeft dorsal foot still with a sizable wound about the size of a border but healthy granulation ooRight dorsal foot still with a slitlike area using silver alginate 07/18/18; the patient's cellulitis in the left foot is improved in fact I think it is on its way to resolving. ooLeft buttock wounds o2 both look better although the larger one has hypertension granulation we've been using silver alginate ooLeft heel has some thick circumferential redundant skin over the wound edge which will need to be removed today we've been using Iodoflex ooLeft dorsal foot is still a sizable wound required debridement using silver alginate ooThe right dorsal foot is just about closed only a small open area remains here 07/25/18; left foot cellulitis is resolved ooLeft buttock wounds o2 both look better. Hyper-granulation on the major area ooLeft heel as some debris over the surface but otherwise looks a healthier wound. Using silver collagen ooRight dorsal  foot is just about closed 07/31/18; arrives with our intake nurse worried about purulent drainage from the buttock. We had hyper-granulation here last week ooHis buttock wounds o2 continue to look better ooLeft heel some debris over the  surface but measuring smaller. ooRight dorsal foot unfortunately has openings between the toes ooLeft foot superficial wound looks less aggravated. 08/07/18 ooButtock wounds continue to look better although some of her granulation and the larger medial wound. silver alginate ooLeft heel continues to look a lot better.silver collagen ooLeft foot superficial wound looks less stable. Requires debridement. He has a new wound superficial area on the foot on the lateral dorsal foot. ooRight foot looks better using silver alginate without Lotrisone 08/14/2018; patient was in the ER last week diagnosed with a UTI. He is now on Cefpodoxime and Macrodantin. ooButtock wounds continued to be smaller. Using silver alginate ooLeft heel continues to look better using silver collagen ooLeft foot superficial wound looks as though it is improving ooRight dorsal foot area is just about healed. 08/21/2018; patient is completed his antibiotics for his UTI. ooHe has 2 open areas on the buttocks. There is still not closed although the surface looks satisfactory. Using silver alginate ooLeft heel continues to improve using silver collagen ooThe bilateral dorsal foot areas which are at the base of his first and second toes/possible tinea pedis are actually stable on the left but worse on the right. The area on the left required debridement of necrotic surface. After debridement I obtained a specimen for PCR culture. ooThe right dorsal foot which is been just about healed last week is now reopened 08/28/2018; culture done on the left dorsal foot showed coag negative staph both staph epidermidis and Lugdunensis. I think this is worthwhile initiating systemic treatment. I will  use doxycycline given his long list of allergies. The area on the left heel slightly improved but still requiring debridement. ooThe large wound on the buttock is just about closed whereas the smaller one is larger. Using silver alginate in this area 09/04/2018; patient is completing his doxycycline for the left foot although this continues to be a very difficult wound area with very adherent necrotic debris. We are using silver alginate to all his wounds right foot left foot and the small wounds on his buttock, silver collagen on the left heel. 09/11/2018; once again this patient has intense erythema and swelling of the left forefoot. Lesser degrees of erythema in the right foot. He has a long list of allergies and intolerances. I will reinstitute doxycycline. 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 Using silver alginate ooLeft heel also looks better using silver collagen ooUnfortunately both the areas on his feet look worse. The area on the left first second webspace is now gone through to the plantar part of his foot. The area on the left foot anteriorly is irritated with erythema and swelling in the forefoot. 09/25/2018 ooHis wound on the left plantar heel looks better. Using silver collagen ooThe area on the left buttock 2 small remnant areas. One is closed one is still open. Using silver alginate ooThe areas between both his first and second toes look worse. This in spite of long-standing antifungal therapy with ketoconazole and silver alginate which should have antifungal activity ooHe has small areas around his original wound on the left calf one is on the bottom of the original scar tissue and one superiorly both of these are small and superficial but again given wound history in this site this is worrisome 10/02/2018 ooLeft plantar heel continues to gradually contract using silver collagen ooLeft buttock wound is unchanged  using silver alginate ooThe areas on his dorsal feet between his first and  second toes bilaterally look about the same. 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 Small open area here. We have been using silver alginate 10/09/2018; ooLeft plantar heel continues to look healthy and contract. No debridement is required ooLeft buttock slightly smaller with a tape injury wound just below which was new this week ooDorsal feet somewhat improved I have been using clindamycin ooLeft lateral looks lower extremity the actual open area looks worse although a lot of this is epithelialized. I am going to change to silver collagen today He has a lot more swelling in the right leg although this is not pitting not red and not particularly warm there is a lot of spasm in the right leg usually indicative of people with paralysis of some underlying discomfort. We have reviewed his vascular status from 2017 he had a left greater saphenous vein ablation. I wonder about referring him back to vascular surgery if the area on the left leg continues to deteriorate. 10/16/2018 in today for follow-up and management of multiple lower extremity ulcers. His left Buttock wound is much lower smaller and almost closed completely. The wound to the left ankle has began to reopen with Epithelialization and some adherent slough. He has multiple new areas to the left foot and leg. The left dorsal foot without much improvement. Wound present between left great webspace and 2nd toe. Erythema and edema present right leg. Right LE ultrasound obtained on 10/10/18 was negative for DVT . 10/23/2018; ooLeft buttock is closed over. Still dry macerated skin but there is no open wound. I suspect this is chronic pressure/moisture ooLeft lateral calf is quite a bit  worse than when I saw this last. There is clearly drainage here he has macerated skin into the left plantar heel. We will change the primary dressing to alginate ooLeft dorsal foot has some improvement in overall wound area. Still using clindamycin and silver alginate ooRight dorsal foot about the same as the left using clindamycin and silver alginate ooThe erythema in the right leg has resolved. He is DVT rule out was negative ooLeft heel pressure area required debridement although the wound is smaller and the surface is health 10/26/2018 ooThe patient came back in for his nurse check today predominantly because of the drainage coming out of the left lateral leg with a recent reopening of his original wound on the left lateral calf. He comes in today with a large amount of surrounding erythema around the wound extending from the calf into the ankle and even in the area on the dorsal foot. He is not systemically unwell. He is not febrile. Nevertheless this looks like cellulitis. We have been using silver alginate to the area. I changed him to a regular visit and I am going to prescribe him doxycycline. The rationale here is a long list of medication intolerances and a history of MRSA. I did not see anything that I thought would provide a valuable culture 10/30/2018 ooFollow-up from his appointment 4 days ago with really an extensive area of cellulitis in the left calf left lateral ankle and left dorsal foot. I put him on doxycycline. He has a long list of medication allergies which are true allergy reactions. 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 In any case he is a lot better  today. The erythema especially in the anterior and lateral left calf is better. He still has left ankle erythema. He also is complaining about increasing edema in the right leg we have only been using Kerlix Coban and he has been doing the wraps at  home. Finally he has a spotty rash on the medial part of his upper left calf which looks like folliculitis or perhaps wrap occlusion type injury. Small superficial macules not pustules 11/06/18 patient arrives today with again a considerable degree of erythema around the wound on the left lateral calf extending into the dorsal ankle and dorsal foot. This is a lot worse than when I saw this last week. He is on doxycycline really with not a lot of improvement. He has not been systemically unwell Wounds on the; left heel actually looks improved. Original area on the left foot and proximity to the first and second toes looks about the same. He has superficial areas on the dorsal foot, anterior calf and then the reopening of his original wound on the left lateral calf which looks about the same ooThe only area he has on the right is the dorsal webspace first and second which is smaller. ooHe has a large area of dry erythematous skin on the left buttock small open area here. 11/13/2018; the patient arrives in much better condition. The erythema around the wound on the left lateral calf is a lot better. Not sure whether this was the clindamycin or the TCA and ketoconazole or just in the improvement in edema control [stasis dermatitis]. In any case this is a lot better. The area on the left heel is very small and just about resolved using silver collagen we have been using silver alginate to the areas on his dorsal feet 11/20/2018; his wounds include the left lateral calf, left heel, dorsal aspects of both feet just proximal to the first second webspace. He is stable to slightly improved. I did not think any changes to his dressings were going to be necessary 11/27/2018 he has a reopening on the left buttock which is surrounded by what looks like tinea or perhaps some other form of dermatitis. The area on the left dorsal foot has some erythema around it I have marked this area but I am not sure whether this is  cellulitis or not. Left heel is not closed. Left calf the reopening is really slightly longer and probably worse 1/13; in general things look better and smaller except for the left dorsal foot. Area on the left heel is just about closed, left buttock looks better only a small wound remains in the skin looks better [using Lotrisone] 1/20; the area on the left heel only has a few remaining open areas here. Left lateral calf about the same in terms of size, left dorsal foot slightly larger right lateral foot still not closed. The area on the left buttock has no open wound and the surrounding skin looks a lot better 1/27; the area on the left heel is closed. Left lateral calf better but still requiring extensive debridements. The area on his left buttock is closed. He still has the open areas on the left dorsal foot which is slightly smaller in the right foot which is slightly expanded. We have been using Iodoflex on these areas as well 2/3; left heel is closed. Left lateral calf still requiring debridement using Iodoflex there is no open area on his left buttock however he has dry scaly skin over a large area of this.  Not really responding well to the Lotrisone. Finally the areas on his dorsal feet at the level of the first second webspace are slightly smaller on the right and about the same on the left. Both of these vigorously debrided with Anasept and gauze 2/10; left heel remains closed he has dry erythematous skin over the left buttock but there is no open wound here. Left lateral leg has come in and with. Still requiring debridement we have been using Iodoflex here. Finally the area on the left dorsal foot and right dorsal foot are really about the same extremely dry callused fissured areas. He does not yet have a dermatology appointment 2/17; left heel remains closed. He has a new open area on the left buttock. The area on the left lateral calf is bigger longer and still covered in necrotic  debris. No major change in his foot areas bilaterally. I am awaiting for a dermatologist to look on this. We have been using ketoconazole I do not know that this is been doing any good at all. 2/24; left heel remains closed. The left buttock wound that was new reopening last week looks better. The left lateral calf appears better also although still requires debridement. The major area on his foot is the left first second also requiring debridement. We have been putting Prisma on all wounds. I do not believe that the ketoconazole has done too much good for his feet. He will use Lotrisone I am going to give him a 2-week course of terbinafine. We still do not have a dermatology appointment 3/2 left heel remains closed however there is skin over bone in this area I pointed this out to him today. The left buttock wound is epithelialized but still does not look completely stable. The area on the left leg required debridement were using silver collagen here. With regards to his feet we changed to Lotrisone last week and silver alginate. 3/9; left heel remains closed. Left buttock remains closed. The area on the right foot is essentially closed. The left foot remains unchanged. Slightly smaller on the left lateral calf. Using silver collagen to both of these areas 3/16-Left heel remains closed. Area on right foot is closed. Left lateral calf above the lateral malleolus open wound requiring debridement with easy bleeding. Left dorsal wound proximal to first toe also debrided. Left ischial area open new. Patient has been using Prisma with wrapping every 3 days. Dermatology appointment is apparently tomorrow.Patient has completed his terbinafine 2-week course with some apparent improvement according to him, there is still flaking and dry skin in his foot on the left 3/23; area on the right foot is reopened. The area on the left anterior foot is about the same still a very necrotic adherent surface. He still  has the area on the left leg and reopening is on the left buttock. He apparently saw dermatology although I do not have a note. According to the patient who is usually fairly well informed they did not have any good ideas. Put him on oral terbinafine which she is been on before. 3/30; using silver collagen to all wounds. Apparently his dermatologist put him on doxycycline and rifampin presumably some culture grew staph. I do not have this result. He remains on terbinafine although I have used terbinafine on him before 4/6; patient has had a fairly substantial reopening on the right foot between the first and second toes. He is finished his terbinafine and I believe is on doxycycline and rifampin still as prescribed  by dermatology. We have been using silver collagen to all his wounds although the patient reports that he thinks silver alginate does better on the wounds on his buttock. 4/13; the area on his left lateral calf about the same size but it did not require debridement. ooLeft dorsal foot just proximal to the webspace between the first and second toes is about the same. Still nonviable surface. I note some superficial bronze discoloration of the dorsal part of his foot ooRight dorsal foot just proximal to the first and second toes also looks about the same. I still think there may be the same discoloration I noted above on the left ooLeft buttock wound looks about the same 4/20; left lateral calf appears to be gradually contracting using silver collagen. 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 The left dorsal foot wound is debrided of tightly adherent necrotic debris and really cleans up quite nicely. The right area is worse with expansion. I did not debride this it is now over the base of the second toe ooThe area on his left buttock is smaller no debridement is required using silver collagen 5/4; left calf continues to make  good progress. ooHe arrives with erythema around the wounds on his dorsal foot which even extends to the plantar aspect. Very concerning for coexistent infection. He is finished the erythromycin I gave him for possible erythrasma this does not seem to have helped. ooThe area on the left foot is about the same base of the dorsal toes ooIs area on the buttock looks improved on the left 5/11; left calf and left buttock continued to make good progress. Left foot is about the same to slightly improved. ooMajor problem is on the right foot. He has not had an x-ray. Deep tissue culture I did last week showed both Enterobacter and E. coli. I did not change the doxycycline I put him on empirically although neither 1 of these were plated to doxycycline. He arrives today with the erythema looking worse on both the dorsal and plantar foot. Macerated skin on the bottom of the foot. he has not been systemically unwell 5/18-Patient returns at 1 week, left calf wound appears to be making some progress, left buttock wound appears slightly worse than last time, left foot wound looks slightly better, right foot redness is marginally better. X-ray of both feet show no air or evidence of osteomyelitis. Patient is finished his Omnicef and terbinafine. He continues to have macerated skin on the bottom of the left foot as well as right 5/26; left calf wound is better, left buttock wound appears to have multiple small superficial open areas with surrounding macerated skin. X-rays that I did last time showed no evidence of osteomyelitis in either foot. He is finished cefdinir and doxycycline. I do not think that he was on terbinafine. He continues to have a large superficial open area on the right foot anterior dorsal and slightly between the first and second toes. I did send him to dermatology 2 months ago or so wondering about whether they would do a fungal scraping. I do not believe they did but did do a culture. We  have been using silver alginate to the toe areas, he has been using antifungals at home topically either ketoconazole or Lotrisone. We are using silver collagen on the left foot, silver alginate on the right, silver collagen on the left lateral leg and silver alginate on the left buttock 6/1; left buttock area is healed.  We have the left dorsal foot, left lateral leg and right dorsal foot. We are using silver alginate to the areas on both feet and silver collagen to the area on his left lateral calf 6/8; the left buttock apparently reopened late last week. He is not really sure how this happened. He is tolerating the terbinafine. Using silver alginate to all wounds 6/15; left buttock wound is larger than last week but still superficial. ooCame in the clinic today with a report of purulence from the left lateral leg I did not identify any infection ooBoth areas on his dorsal feet appear to be better. He is tolerating the terbinafine. Using silver alginate to all wounds 6/22; left buttock is about the same this week, left calf quite a bit better. His left foot is about the same however he comes in with erythema and warmth in the right forefoot once again. Culture that I gave him in the beginning of May showed Enterobacter and E. coli. I gave him doxycycline and things seem to improve although neither 1 of these organisms was specifically plated. 6/29; left buttock is larger and dry this week. Left lateral calf looks to me to be improved. Left dorsal foot also somewhat improved right foot completely unchanged. The erythema on the right foot is still present. He is completing the Ceftin dinner that I gave him empirically [see discussion above.) 7/6 - All wounds look to be stable and perhaps improved, the left buttock wound is slightly smaller, per patient bleeds easily, completed ceftin, the right foot redness is less, he is on terbinafine 7/13; left buttock wound about the same perhaps slightly  narrower. Area on the left lateral leg continues to narrow. Left dorsal foot slightly smaller right foot about the same. We are using silver alginate on the right foot and Hydrofera Blue to the areas on the left. Unna boot on the left 2 layer compression on the right 7/20; left buttock wound absolutely the same. Area on lateral leg continues to get better. Left dorsal foot require debridement as did the right no major change in the 7/27; left buttock wound the same size necrotic debris over the surface. The area on the lateral leg is closed once again. His left foot looks better right foot about the same although there is some involvement now of the posterior first second toe area. He is still on terbinafine which I have given him for a month, not certain a centimeter major change 06/25/19-All wounds appear to be slightly improved according to report, left buttock wound looks clean, both foot wounds have minimal to no debris the right dorsal foot has minimal slough. We are using Hydrofera Blue to the left and silver alginate to the right foot and ischial wound. 8/10-Wounds all appear to be around the same, the right forefoot distal part has some redness which was not there before, however the wound looks clean and small. Ischial wound looks about the same with no changes 8/17; his wound on the left lateral calf which was his original chronic venous insufficiency wound remains closed. Since I last saw him the areas on the left dorsal foot right dorsal foot generally appear better but require debridement. The area on his left initial tuberosity appears somewhat larger to me perhaps hyper granulated and bleeds very easily. We have been using Hydrofera Blue to the left dorsal foot and silver alginate to everything else 8/24; left lateral calf remains closed. The areas on his dorsal feet on the webspace of the  first and second toes bilaterally both look better. The area on the left buttock which is the  pressure ulcer stage II slightly smaller. I change the dressing to Hydrofera Blue to all areas 8/31; left lateral calf remains closed. The area on his dorsal feet bilaterally look better. Using Hydrofera Blue. Still requiring debridement on the left foot. No change in the left buttock pressure ulcers however 9/14; left lateral calf remains closed. Dorsal feet look quite a bit better than 2 weeks ago. Flaking dry skin also a lot better with the ammonium lactate I gave him 2 weeks ago. The area on the left buttock is improved. He states that his Roho cushion developed a leak and he is getting a new one, in the interim he is offloading this vigorously 9/21; left calf remains closed. Left heel which was a possible DTI looks better this week. He had macerated tissue around the left dorsal foot right foot looks satisfactory and improved left buttock wound. I changed his dressings to his feet to silver alginate bilaterally. Continuing Hydrofera Blue on the left buttock. 9/28 left calf remains closed. Left heel did not develop anything [possible DTI] dry flaking skin on the left dorsal foot. Right foot looks satisfactory. Improved left buttock wound. We are using silver alginate on his feet Hydrofera Blue on the buttock. I have asked him to go back to the Lotrisone on his feet including the wounds and surrounding areas 10/5; left calf remains closed. The areas on the left and right feet about the same. A lot of this is epithelialized however debris over the remaining open areas. He is using Lotrisone and silver alginate. The area on the left buttock using Hydrofera Blue 10/26. Patient has been out for 3 weeks secondary to Covid concerns. He tested negative but I think his wife tested positive. He comes in today with the left foot substantially worse, right foot about the same. Even more concerning he states that the area on his left buttock closed over but then reopened and is considerably deeper in one  aspect than it was before [stage III wound] 11/2; left foot really about the same as last week. Quarter sized wound on the dorsal foot just proximal to the first second toes. Surrounding erythema with areas of denuded epithelium. This is not really much different looking. Did not look like cellulitis this time however. ooRight foot area about the same.. We have been using silver alginate alginate on his toes ooLeft buttock still substantial irritated skin around the wound which I think looks somewhat better. We have been using Hydrofera Blue here. 11/9; left foot larger than last week and a very necrotic surface. Right foot I think is about the same perhaps slightly smaller. Debris around the circumference also addressed. Unfortunately on the left buttock there is been a decline. Satellite lesions below the major wound distally and now a an additional one posteriorly we have been using Hydrofera Blue but I think this is a pressure issue 11/16; left foot ulcer dorsally again a very adherent necrotic surface. Right foot is about the same. Not much change in the pressure ulcer on his left buttock. 11/30; left foot ulcer dorsally basically the same as when I saw him 2 weeks ago. Very adherent fibrinous debris on the wound surface. Patient reports a lot of drainage as well. 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 We have been using Iodoflex, patient changed back to alginate because of the drainage.  Area on his right dorsal foot really looks benign with a healthier surface certainly a lot better than on the left. Left buttock wounds all improved using Hydrofera Blue 12/7; left dorsal foot again no improvement. Tightly adherent debris. PCR culture I did last week only showed likely skin contaminant. I have gone ahead and done a punch biopsy of this which is about the last thing in terms of investigations I can think to do. He has known venous insufficiency  and venous hypertension and this could be the issue here. The area on the right foot is about the same left buttock slightly worse according to our intake nurse secondary to [facility] Blue sticking to the wound 12/14; biopsy of the left foot that I did last time showed changes that could be related to wound healing/chronic stasis dermatitis phenomenon no neoplasm. We have been using silver alginate to both feet. I change the one on the left today to Sorbact and silver alginate to his other 2 wounds 12/28; the patient arrives with the following problems; ooMajor issue is the dorsal left foot which continues to be a larger deeper wound area. Still with a completely nonviable surface ooParadoxically the area mirror image on the right on the right dorsal foot appears to be getting better. ooHe had some loss of dry denuded skin from the lower part of his original wound on the left lateral calf. Some of this area looked a little vulnerable and for this reason we put him in wrap that on this side this week ooThe area on his left buttock is larger. He still has the erythematous circular area which I think is a combination of pressure, sweat. This does not look like cellulitis or fungal dermatitis 11/26/2019; -Dorsal left foot large open wound with depth. Still debris over the surface. Using Sorbact ooThe area on the dorsal right foot paradoxically has closed over [place] [facility] has a reopening on the left ankle laterally at the base of his original wound that extended up into the calf. This appears clean. ooThe left buttock wound is smaller but with very adherent necrotic debris over the surface. We have been using silver alginate here as well The patient had arterial studies done in 2017. He had biphasic waveforms at the dorsalis pedis and posterior tibial bilaterally. ABI in the left was 1.17. Digit waveforms were dampened. He has slight spasticity in the great toes I do not think a TBI would be  possible 1/11; the patient comes in today with a sizable reopening between the first and second toes on the right. This is not exactly in the same location where we have been treating wounds previously. According to our intake nurse this was actually fairly deep but 0.6 cm. The area on the left dorsal foot looks about the same the surface is somewhat cleaner using Sorbact, his MRI is in 2 days. We have not managed yet to get arterial studies. The new reopening on the left lateral calf looks somewhat better using alginate. The left buttock wound is about the same using alginate 1/18; the patient had his ARTERIAL studies which were quite normal. ABI in the right at 1.13 with triphasic/biphasic waveforms on the left ABI 1.06 again with triphasic/biphasic waveforms. It would not have been possible to have done a toe brachial index because of spasticity. We have been using Sorbac to the left foot alginate to the rest of his wounds on the right foot left lateral calf and left buttock 1/25; arrives in clinic with erythema  and swelling of the left forefoot worse over the first MTP area. This extends laterally dorsally and but also posteriorly. Still has an area on the left lateral part of the lower part of his calf wound it is eschared and clearly not closed. ooArea on the left buttock still with surrounding irritation and erythema. ooRight foot surface wound dorsally. The area between the right and first and second toes appears better. 2/1; ooThe left foot wound is about the same. Erythema slightly better I gave him a week of doxycycline empirically ooRight foot wound is more extensive extending between the toes to the plantar surface ooLeft lateral calf really no open surface on the inferior part of his original wound however the entire area still looks vulnerable ooAbsolutely no improvement in the left buttock wound required debridement. 2/8; the left foot is about the same. Erythema is slightly  improved I gave him clindamycin last week. ooRight foot looks better he is using Lotrimin and silver alginate ooHe has a breakdown in the left lateral calf. Denuded epithelium which I have removed ooLeft buttock about the same were using Hydrofera Blue 2/15; left foot is about the same there is less surrounding erythema. Surface still has tightly adherent debris which I have debriding however not making any progress ooRight foot has a substantial wound on the medial right second toe between the first and second webspace. ooStill an open area on the left lateral calf distal area. ooButtock wound is about the same 2/22; left foot is about the same less surrounding erythema. Surface has adherent debris. Polymen Ag Right foot area significant wound between the first and second toes. We have been using silver alginate here Left lateral leg polymen Ag at the base of his original venous insufficiency wound ooLeft buttock some improvement here 3/1; ooRight foot is deteriorating in the first second toe webspace. Larger and more substantial. We have been using silver alginate. ooLeft dorsal foot about the same markedly adherent surface debris using PolyMem Ag ooLeft lateral calf surface debris using PolyMem AG ooLeft buttock is improved again using PolyMem Ag. ooHe is completing his terbinafine. The erythema in the foot seems better. He has been on this for 2 weeks 3/8; no improvement in any wound area in fact he has a small open area on the dorsal midfoot which is new this week. He has not gotten his foot x-rays yet 3/15; his x-rays were both negative for osteomyelitis of both feet. No major change in any of his wounds on the extremities however his buttock wounds are better. We have been using polymen on the buttocks, left lower leg. Iodoflex on the left foot and silver alginate on the right 3/22; arrives in clinic today with the 2 major issues are the improvement in the left dorsal foot  wound which for once actually looks healthy with a nice healthy wound surface without debridement. Using Iodoflex here. Unfortunately on the left lateral calf which is in the distal part of his original wound he came to the clinic here for there was purulent drainage noted some increased breakdown scattered around the original area and a small area proximally. We we are using polymen here will change to silver alginate today. His buttock wound on the left is better and I think the area on the right first second toe webspace is also improved 3/29; left dorsal foot looks better. Using Iodoflex. Left ankle culture from deterioration last time grew E. coli, Enterobacter and Enterococcus. I will give him a course of  cefdinir although that will not cover Enterococcus. The area on the right foot in the webspace of the first and second toe lateral first toe looks better. The area on his buttock is about healed Vascular appointment is on April 21. This is to look at his venous system vis--vis continued breakdown of the wounds on the left including the left lateral leg and left dorsal foot he. He has had previous ablations on this side 4/5; the area between the right first and second toes lateral aspect of the first toe looks better. Dorsal aspect of the left first toe on the left foot also improved. Unfortunately the left lateral lower leg is larger and there is a second satellite wound superiorly. The usual superficial abrasions on the left buttock overall better but certainly not closed 4/12; the area between the right first and second toes is improved. Dorsal aspect of the left foot also slightly smaller with a vibrant healthy looking surface. No real change in the left lateral leg and the left buttock wound is healed He has an unaffordable co-pay for Apligraf. Appointment with vein and vascular with regards to the left leg venous part of the circulation is on 4/21 4/19; we continue to see improvement in  all wound areas. Although this is minor. He has his vascular appointment on 4/21. The area on the left buttock has not reopened although right in the center of this area the skin looks somewhat threatened 4/26; the left buttock is unfortunately reopened. In general his left dorsal foot has a healthy surface and looks somewhat smaller although it was not measured as such. The area between his first and second toe webspace on the right as a small wound against the first toe. The patient saw vascular surgery. The real question I was asking was about the small saphenous vein on the left. He has previously ablated left greater saphenous vein. Nothing further was commented on on the left. Right greater saphenous vein without reflux at the saphenofemoral junction or proximal thigh there was no indication for ablation of the right greater saphenous vein duplex was negative for DVT bilaterally. They did not think there was anything from a vascular surgery point of view that could be offered. They ABIs within normal limits 5/3; only small open area on the left buttock. The area on the left lateral leg which was his original venous reflux is now 2 wounds both which look clean. We are using Iodoflex on the left dorsal foot which looks healthy and smaller. He is down to a very tiny area between the right first and second toes, using silver alginate 5/10; all of his wounds appear better. We have much better edema control in 4 layer compression on the left. This may be the factor that is allowing the left foot and left lateral calf to heal. He has external compression garments at home 04/14/20-All of his wounds are progressing well, the left forefoot is practically closed, left ischium appears to be about the same, right toe webspace is also smaller. The left lateral leg is about the same, continue using Hydrofera Blue to this, silver alginate to the ischium, Iodoflex to the toe space on the right 6/7; most of his  wounds outside of the left buttock are doing well. The area on the left lateral calf and left dorsal foot are smaller. The area on the right foot in between the first and second toe webspace is barely visible although he still says there is some drainage here  is the only reason I did not heal this out. ooUnfortunately the area on the left buttock almost looks like he has a skin tear from tape. He has open wound and then a large flap of skin that we are trying to get adherence over an area just next to the remaining wound 6/21; 2 week follow-up. I believe is been here for nurse visits. Miraculously the area between his first and second toes on the left dorsal foot is closed over. Still open on the right first second web space. The left lateral calf has 2 open areas. Distally this is more superficial. The proximal area had a little more depth and required debridement of adherent necrotic material. His buttock wound is actually larger we have been using silver alginate here 6/28; the patient's area on the left foot remains closed. Still open wet area between the first and second toes on the right and also extending into the plantar aspect. We have been using silver alginate in this location. He has 2 areas on the left lower leg part of his original long wounds which I think are better. We have been using Hydrofera Blue here. Hydrofera Blue to the left buttock which is stable Objective Constitutional Sitting or standing Blood Pressure is within target range for patient.. Pulse regular and within target range for patient.Marland Kitchen Respirations regular, non-labored and within target range.. Temperature is normal and within the target range for the patient.Marland Kitchen Appears in no distress. Vitals Time Taken: 7:54 AM, Height: 70 in, Weight: 216 lbs, BMI: 31, Temperature: 98.4 F, Pulse: 109 bpm, Respiratory Rate: 16 breaths/min, Blood Pressure: 134/73 mmHg. General Notes: Wound exam; ooThe left ischial tuberosity wound  about the same better looking surface and surrounding skin looks somewhat less irritated. ooLeft lateral leg has 2 open areas both of these look smaller -The left dorsal foot has remained closed. Once a exceptionally difficult wound. I did not expect this to be closed at any point. Nevertheless I will make it an active today ooDifficult area between the first and second toes on the right right in the webspace extending slightly into the plantar aspect. Difficult to examine secondary to extreme muscle spasm when you attempt to look at this area. We have been using silver alginate in this area Integumentary (Hair, Skin) Wound #37 status is Open. Original cause of wound was Gradually Appeared. The wound is located on the Left,Lateral Malleolus. The wound measures 8cm length x 0.5cm width x 0.3cm depth; 3.142cm^2 area and 0.942cm^3 volume. Wound #38 status is Open. Original cause of wound was Gradually Appeared. The wound is located on the Right T - Web between 1st and 2nd. The wound oe measures 4cm length x 4cm width x 0.2cm depth; 12.566cm^2 area and 2.513cm^3 volume. Wound #41 status is Open. Original cause of wound was Gradually Appeared. The wound is located on the Left Ischium. The wound measures 4cm length x 1.5cm width x 0.1cm depth; 4.712cm^2 area and 0.471cm^3 volume. 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 There was a Four Layer Compression Therapy Procedure by Levan Hurst, RN. Post procedure Diagnosis Wound #37: Same as Pre-Procedure Plan Follow-up  Appointments: Return Appointment in 2 weeks. Nurse Visit: - 1 week for rewrap Dressing Change Frequency: Wound #37 Left,Lateral Malleolus: Do not change entire dressing for one week. Wound #38 Right T - Web between 1st and 2nd: oe Change Dressing every other day. Wound #41 Left Ischium: Change Dressing every other day. Skin Barriers/Peri-Wound Care: Antifungal cream - right foot Moisturizing lotion - both legs TCA Cream or Ointment Primary Wound Dressing: Wound #37 Left,Lateral Malleolus: Hydrofera Blue Wound #38 Right T - Web between 1st and 2nd: oe Calcium Alginate with Silver Wound #41 Left Ischium: Hydrofera Blue Secondary Dressing: Wound #37 Left,Lateral Malleolus: Dry Gauze ABD pad Wound #38 Right T - Web between 1st and 2nd: oe Kerlix/Rolled Gauze - secure with tape Dry Gauze Wound #41 Left Ischium: Dry Gauze ABD pad Edema Control: 4 layer compression: Left lower extremity Elevate legs to the level of the heart or above for 30 minutes daily and/or when sitting, a frequency of: - throughout the day Support Garment 30-40 mm/Hg pressure to: - Juxtalite to right leg Off-Loading: Low air-loss mattress (Group 2) Roho cushion for wheelchair Turn and reposition every 2 hours - out of wheelchair throughout the day, try to lay on sides, sleep in the bed not the recliner 1. I did not change any of the dressing which includes Hydrofera Blue on the buttock and left lateral lower leg and silver alginate between the first and second toes #2 I will bring him back for a nurse visit next week and see him again in 2 weeks. Everything appears to be stable to improved. 3. I think the area on left buttock is a pressure ulcer complicated by friction/sweat. I have talked to him about this at length Electronic Signature(s) Signed: 05/19/2020 5:46:39 PM By: Linton Ham MD Entered By: Linton Ham on 05/19/2020  08:24:37 -------------------------------------------------------------------------------- SuperBill Details Patient Name: Date of Service: Sattler, [name] 05/19/2020 Medical Record Number: [id number] Patient Account Number: [id number] Date of Birth/Sex: Treating RN: 09/07/1986 (34 y.o. [name] Primary Care Provider: [place], [place] Other Clinician: Referring Provider: Treating Provider/Extender: [name]: 228 Diagnosis Coding ICD-10 Codes Code Description I87.332 Chronic venous hypertension (idiopathic) with ulcer and inflammation of left lower extremity L97.321 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 to breakdown of skin L97.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, 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, complete Facility Procedures CPT4 Code: 53664403 Description: (Facility Use Only) [id number] - [facility] LWR LT LEG Modifier: Quantity: 1 Physician Procedures : CPT4 Code Description Modifier [id number] - WC PHYS LEVEL 3 - EST PT ICD-10 Diagnosis Description I87.332 Chronic venous hypertension (idiopathic) with ulcer and inflammation of left lower extremity L97.321 Non-pressure chronic ulcer of left ankle  limited to breakdown of skin L97.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, stage 3 Quantity: 1 Electronic Signature(s) Signed: 05/19/2020 5:46:39 PM By: Linton Ham MD Signed: 05/19/2020 6:01:25 PM By: Levan Hurst RN, BSN Entered By: Levan Hurst on 05/19/2020 08:33:58

## 2020-05-20 NOTE — Progress Notes (Signed)
Stice, [name] ([id number]) Visit Report for 05/19/2020 Arrival Information Details Patient Name: Date of Service: [place]beau, [name] 05/19/2020 7:30 A M Medical Record Number: [id number] Patient Account Number: [id number] Date of Birth/Sex: Treating RN: 11-15-1986 (34 y.o. [name]) Carlene Coria Primary Care Tomara Youngberg: [name], [name] Other Clinician: Referring Tuff Clabo: Treating Rupinder Livingston/Extender: [name]: 32 Visit Information History Since Last Visit All ordered tests and consults were completed: No Patient Arrived: Wheel Chair Added or deleted any medications: No Arrival Time: 07:53 Any new allergies or adverse reactions: No Accompanied By: self Had a fall or experienced change in No Transfer Assistance: None activities of daily living that may affect Patient Identification Verified: Yes risk of falls: Secondary Verification Process Completed: Yes Signs or symptoms of abuse/neglect since last visito No Patient Requires Transmission-Based Precautions: No Hospitalized since last visit: No Patient Has Alerts: Yes Implantable device outside of the clinic excluding No Patient Alerts: R ABI = 1.0 cellular tissue based products placed in the center L ABI = 1.1 since last visit: Has Dressing in Place as Prescribed: Yes Has Compression in Place as Prescribed: Yes Pain Present Now: No Electronic Signature(s) Signed: 05/20/2020 5:22:48 PM By: Carlene Coria RN Entered By: Carlene Coria on 05/19/2020 07:53:35 -------------------------------------------------------------------------------- Compression Therapy Details Patient Name: Date of Service: [name], A [name] 05/19/2020 7:30 A M Medical Record Number: [id number] Patient Account Number: [id number] Date of Birth/Sex: Treating RN: April 29, 1986 (34 y.o. [name] Primary Care Gilberto Streck: [place], [place] [place] Other Clinician: Referring Mistey Hoffert: Treating Jazzlyn Huizenga/Extender: [name] in  Treatment: 228 Compression Therapy Performed for Wound Assessment: Wound #37 Left,Lateral Malleolus Performed By: Clinician Levan Hurst, RN Compression Type: Four Layer Post Procedure Diagnosis Same as Pre-procedure Electronic Signature(s) Signed: 05/19/2020 6:01:25 PM By: Levan Hurst RN, BSN Entered By: Levan Hurst on 05/19/2020 08:19:13 -------------------------------------------------------------------------------- Lower Extremity Assessment Details Patient Name: Date of Service: [name], A [name] 05/19/2020 7:30 A M Medical Record Number: [id number] Patient Account Number: [id number] Date of Birth/Sex: Treating RN: 1986/06/04 (34 y.o. [name] Primary Care Jaques Mineer: [place], [place] Other Clinician: Referring Marquis Down: Treating Heidie Krall/Extender: [name]: 228 Edema Assessment Assessed: [Left: No] [Right: No] Edema: [Left: No] [Right: Yes] Calf Left: Right: Point of Measurement: 33 cm From Medial Instep 33 cm cm Ankle Left: Right: Point of Measurement: 10 cm From Medial Instep 24.4 cm cm Electronic Signature(s) Signed: 05/20/2020 5:22:48 PM By: Carlene Coria RN Entered By: Carlene Coria on 05/19/2020 08:07:41 -------------------------------------------------------------------------------- Multi Wound Chart Details Patient Name: Date of Service: [name], A [name] 05/19/2020 7:30 A M Medical Record Number: [id number] Patient Account Number: [id number] Date of Birth/Sex: Treating RN: [birth year] (34 y.o. [name] Primary Care Rayelle Armor: [name], [name] Other Clinician: Referring Aanchal Cope: Treating Jonique Kulig/Extender: Dutch Gray, [name]: 228 Vital Signs Height(in): 70 Pulse(bpm): 109 Weight(lbs): 216 Blood Pressure(mmHg): 134/73 Body Mass Index(BMI): 31 Temperature(F): 98.4 Respiratory Rate(breaths/min): 16 Photos: [37:No Photos Left, Lateral Malleolus] [38:No Photos Right T - Web  between 1st and 2nd oe] [41:No Photos Left Ischium] Wound Location: [37:Gradually Appeared] [38:Gradually Appeared] [41:Gradually Appeared] Wounding Event: [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] [38:Inflammatory] [41:Pressure Ulcer] Primary Etiology: [37:11/26/2019] [38:11/30/2019] [41:03/16/2020] Date Acquired: [37:25] [38:24] [41:9] Weeks of Treatment: [37:Open] [38:Open] [41:Open] Wound Status: [37:8x0.5x0.3] [38:4x4x0.2] [41:4x1.5x0.1] Measurements L x W x D (cm) [37:3.142] [38:12.566] [41:4.712] A (cm) : rea [37:0.942] [38:2.513] [41:0.471] Volume (cm) : [37:20.60%] [38:-3707.90%] [41:-160.90%] % Reduction in A rea: [37:-137.90%] [38:-987.90%] [41:-160.20%] % Reduction in Volume: [37:Full  Thickness Without Exposed] [38:Full Thickness Without Exposed] [41:Category/Stage II] Classification: [37:Support Structures Compression Therapy] [38:Support Structures N/A] [41:N/A] Procedures Performed: Treatment Notes Electronic Signature(s) Signed: 05/19/2020 5:46:39 PM By: Linton Ham MD Signed: 05/19/2020 6:01:25 PM By: Levan Hurst RN, BSN Entered By: Linton Ham on 05/19/2020 08:19:17 -------------------------------------------------------------------------------- Multi-Disciplinary Care Plan Details Patient Name: Date of Service: [name], A [name] 05/19/2020 7:30 A M Medical Record Number: [id number] Patient Account Number: [id number] Date of Birth/Sex: Treating RN: 1986/04/20 (34 y.o. [name] Primary Care Caliana Spires: [name], [name] Other Clinician: Referring Darielle Hancher: Treating Kaelynne Christley/Extender: [name]: 228 Active Inactive Wound/Skin Impairment Nursing Diagnoses: Impaired tissue integrity Knowledge deficit related to ulceration/compromised skin integrity Goals: Patient/caregiver will verbalize understanding of skin care regimen Date Initiated: 01/05/2016 Target Resolution Date: 06/13/2020 Goal Status: Active Ulcer/skin breakdown will have a  volume reduction of 30% by week 4 Date Initiated: 01/05/2016 Date Inactivated: 12/22/2017 Target Resolution Date: 01/19/2018 Unmet Reason: complex wounds, Goal Status: Unmet infection Interventions: Assess patient/caregiver ability to obtain necessary supplies Assess ulceration(s) every visit Provide education on ulcer and skin care Notes: Electronic Signature(s) Signed: 05/19/2020 6:01:25 PM By: Levan Hurst RN, BSN Entered By: Levan Hurst on 05/19/2020 07:55:05 -------------------------------------------------------------------------------- Pain Assessment Details Patient Name: Date of Service: Abadi, A [name] 05/19/2020 7:30 A M Medical Record Number: [id number] Patient Account Number: [id number] Date of Birth/Sex: Treating RN: 21-Apr-1986 (34 y.o. [name] Primary Care Preesha Benjamin: [place], [place] Other Clinician: Referring Shearon Clonch: Treating Torre Schaumburg/Extender: [name]: 228 Active Problems Location of Pain Severity and Description of Pain Patient Has Paino No Site Locations Pain Management and Medication Current Pain Management: Electronic Signature(s) Signed: 05/20/2020 5:22:48 PM By: Carlene Coria RN Entered By: Carlene Coria on 05/19/2020 07:54:42 -------------------------------------------------------------------------------- Patient/Caregiver Education Details Patient Name: Date of Service: [place]row, A Viviann Spare 6/28/2021andnbsp7:30 A M Medical Record Number: [id number] Patient Account Number: [id number] Date of Birth/Gender: Treating RN: February 17, 1986 (34 y.o. [name] Primary Care Physician: Janine Limbo Other Clinician: Referring Physician: Treating Physician/Extender: [name]: 10 Education Assessment Education Provided To: Patient Education Topics Provided Wound/Skin Impairment: Methods: Explain/Verbal Responses: State content correctly Electronic  Signature(s) Signed: 05/19/2020 6:01:25 PM By: Levan Hurst RN, BSN Entered By: Levan Hurst on 05/19/2020 07:55:23 -------------------------------------------------------------------------------- Wound Assessment Details Patient Name: Date of Service: Lanter, A [name] 05/19/2020 7:30 A M Medical Record Number: [id number] Patient Account Number: [id number] Date of Birth/Sex: Treating RN: 26-Nov-1985 (34 y.o. [name] Primary Care Verania Salberg: [name], [name] Other Clinician: Referring Kolleen Ochsner: Treating Jamaiya Tunnell/Extender: [name]: 228 Wound Status Wound Number: 37 Primary Etiology: Venous Leg Ulcer Wound Location: Left, Lateral Malleolus Wound Status: Open Wounding Event: Gradually Appeared Comorbid History: Sleep Apnea, Hypertension, Paraplegia Date Acquired: 11/26/2019 Weeks Of Treatment: 25 Clustered Wound: No Wound Measurements Length: (cm) 8 Width: (cm) 0.5 Depth: (cm) 0.3 Area: (cm) 3.142 Volume: (cm) 0.942 % Reduction in Area: 20.6% % Reduction in Volume: -137.9% Epithelialization: Large (67-100%) Tunneling: No Undermining: No Wound Description Classification: Full Thickness Without Exposed Support Structures Wound Margin: Flat and Intact Exudate Amount: Medium Exudate Type: Serosanguineous Exudate Color: red, brown Foul Odor After Cleansing: No Slough/Fibrino Yes Wound Bed Granulation Amount: Large (67-100%) Exposed Structure Granulation Quality: Red Fascia Exposed: No Necrotic Amount: Small (1-33%) Fat Layer (Subcutaneous Tissue) Exposed: Yes Tendon Exposed: No Muscle Exposed: No Joint Exposed: No Bone Exposed: No Treatment Notes Wound #37 (Left, Lateral Malleolus) 1. Cleanse With Soap and water  2. Periwound Care Moisturizing lotion TCA Cream 3. Primary Dressing Applied Hydrofera Blue 4. Secondary Dressing ABD Pad Dry Gauze 6. Support Layer Applied 4 layer compression Orthoptist) Signed: 05/20/2020 5:22:48 PM By: Carlene Coria RN Entered By: Carlene Coria on 05/19/2020 11:49:03 -------------------------------------------------------------------------------- Wound Assessment Details Patient Name: Date of Service: Levinson, A [name] 05/19/2020 7:30 A M Medical Record Number: [id number] Patient Account Number: [id number] Date of Birth/Sex: Treating RN: 11-26-1985 (34 y.o. [name]) Carlene Coria Primary Care Mario Voong: [place], [place] Other Clinician: Referring Darwyn Ponzo: Treating Amair Shrout/Extender: [name]: 228 Wound Status Wound Number: 38 Primary Etiology: Inflammatory Wound Location: Right T - Web between 1st and 2nd oe Wound Status: Open Wounding Event: Gradually Appeared Comorbid History: Sleep Apnea, Hypertension, Paraplegia Date Acquired: 11/30/2019 Weeks Of Treatment: 24 Clustered Wound: No Wound Measurements Length: (cm) 4 Width: (cm) 4 Depth: (cm) 0.2 Area: (cm) 12.566 Volume: (cm) 2.513 % Reduction in Area: -3707.9% % Reduction in Volume: -987.9% Epithelialization: Medium (34-66%) Tunneling: No Undermining: No Wound Description Classification: Full Thickness Without Exposed Support Structures Wound Margin: Distinct, outline attached Exudate Amount: Medium Exudate Type: Serosanguineous Exudate Color: red, brown Foul Odor After Cleansing: No Slough/Fibrino Yes Wound Bed Granulation Amount: Large (67-100%) Exposed Structure Granulation Quality: Red Fat Layer (Subcutaneous Tissue) Exposed: Yes Necrotic Amount: Small (1-33%) Necrotic Quality: Adherent Therapist, music) Signed: 05/20/2020 5:22:48 PM By: Carlene Coria RN Entered By: Carlene Coria on 05/19/2020 11:50:05 -------------------------------------------------------------------------------- Wound Assessment Details Patient Name: Date of Service: Cassity, A [name] 05/19/2020 7:30 A M Medical Record Number: [id number] Patient Account  Number: [id number] Date of Birth/Sex: Treating RN: May 21, 1986 (34 y.o. [name]) Carlene Coria Primary Care Niani Mourer: [name], [name] Other Clinician: Referring Jackye Dever: Treating Kameka Whan/Extender: [name]: 228 Wound Status Wound Number: 41 Primary Etiology: Pressure Ulcer Wound Location: Left Ischium Wound Status: Open Wounding Event: Gradually Appeared Comorbid History: Sleep Apnea, Hypertension, Paraplegia Date Acquired: 03/16/2020 Weeks Of Treatment: 9 Clustered Wound: No Wound Measurements Length: (cm) 4 Width: (cm) 1.5 Depth: (cm) 0.1 Area: (cm) 4.712 Volume: (cm) 0.471 % Reduction in Area: -160.9% % Reduction in Volume: -160.2% Epithelialization: Medium (34-66%) Tunneling: No Undermining: No Wound Description Classification: Category/Stage II Wound Margin: Distinct, outline attached Exudate Amount: Medium Exudate Type: Sanguinous Exudate Color: red Foul Odor After Cleansing: No Slough/Fibrino No Wound Bed Granulation Amount: Large (67-100%) Exposed Structure Granulation Quality: Pink, Friable Fascia Exposed: No Necrotic Amount: None Present (0%) Fat Layer (Subcutaneous Tissue) Exposed: Yes Tendon Exposed: No Muscle Exposed: No Joint Exposed: No Bone Exposed: No Electronic Signature(s) Signed: 05/20/2020 5:22:48 PM By: Carlene Coria RN Entered By: Carlene Coria on 05/19/2020 11:50:53 -------------------------------------------------------------------------------- Vitals Details Patient Name: Date of Service: Speagle, A [name] 05/19/2020 7:30 A M Medical Record Number: [id number] Patient Account Number: [id number] Date of Birth/Sex: Treating RN: 09-Oct-1986 (34 y.o. [name] Primary Care Jakhia Buxton: [place], [place] Other Clinician: Referring Fletcher Ostermiller: Treating Jolayne Branson/Extender: [name]: 228 Vital Signs Time Taken: 07:54 Temperature (F): 98.4 Height (in): 70 Pulse (bpm):  109 Weight (lbs): 216 Respiratory Rate (breaths/min): 16 Body Mass Index (BMI): 31 Blood Pressure (mmHg): 134/73 Reference Range: 80 - 120 mg / dl Electronic Signature(s) Signed: 05/20/2020 5:22:48 PM By: Carlene Coria RN Entered By: Carlene Coria on 05/19/2020 07:54:31

## 2020-05-24 ENCOUNTER — Other Ambulatory Visit: Payer: Self-pay | Admitting: Psychiatry

## 2020-05-24 DIAGNOSIS — F341 Dysthymic disorder: Secondary | ICD-10-CM

## 2020-05-26 NOTE — Telephone Encounter (Signed)
Last apt 10/25/2019

## 2020-05-27 ENCOUNTER — Encounter (HOSPITAL_BASED_OUTPATIENT_CLINIC_OR_DEPARTMENT_OTHER): Payer: BC Managed Care – PPO | Attending: Internal Medicine | Admitting: Internal Medicine

## 2020-05-27 DIAGNOSIS — I87332 Chronic venous hypertension (idiopathic) with ulcer and inflammation of left lower extremity: Secondary | ICD-10-CM | POA: Insufficient documentation

## 2020-05-27 DIAGNOSIS — L97511 Non-pressure chronic ulcer of other part of right foot limited to breakdown of skin: Secondary | ICD-10-CM | POA: Diagnosis not present

## 2020-05-27 DIAGNOSIS — L97321 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 to breakdown of skin: Secondary | ICD-10-CM | POA: Diagnosis not present

## 2020-05-27 DIAGNOSIS — Z87440 Personal history of urinary (tract) infections: Secondary | ICD-10-CM | POA: Insufficient documentation

## 2020-05-27 DIAGNOSIS — L89323 Pressure ulcer of left buttock, stage 3: Secondary | ICD-10-CM | POA: Insufficient documentation

## 2020-05-27 DIAGNOSIS — Z87891 Personal history of nicotine dependence: Secondary | ICD-10-CM | POA: Diagnosis not present

## 2020-05-27 DIAGNOSIS — G8221 Paraplegia, complete: Secondary | ICD-10-CM | POA: Diagnosis not present

## 2020-05-27 DIAGNOSIS — Z8614 Personal history of Methicillin resistant Staphylococcus aureus infection: Secondary | ICD-10-CM | POA: Insufficient documentation

## 2020-05-27 DIAGNOSIS — L97521 Non-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: Secondary | ICD-10-CM | POA: Diagnosis not present

## 2020-05-27 DIAGNOSIS — I890 Lymphedema, not elsewhere classified: Secondary | ICD-10-CM | POA: Insufficient documentation

## 2020-05-27 DIAGNOSIS — L03116 Cellulitis of left lower limb: Secondary | ICD-10-CM | POA: Diagnosis not present

## 2020-05-27 DIAGNOSIS — L97311 Non-pressure chronic ulcer of right ankle limited to breakdown of skin: Secondary | ICD-10-CM | POA: Insufficient documentation

## 2020-05-27 NOTE — Progress Notes (Signed)
[name] [name] ([id number]) Visit Report for 05/27/2020 Arrival Information Details Patient Name: Date of Service: [name] [name] [name] 05/27/2020 8:00 [name] M Medical Record Number: [id number] Patient Account Number: [id number] Date of Birth/Sex: Treating RN: [birth year] (34 y.o. [name] [name] Primary Care Bluma Buresh: [name], [name] Other Clinician: Referring Gryphon Vanderveen: Treating Sharla Tankard/Extender: [name]: 5 Visit Information History Since Last Visit Added or deleted any medications: No Patient Arrived: Wheel Chair Any new allergies or adverse reactions: No Arrival Time: 08:13 Had [name] fall or experienced change in No Accompanied By: self activities of daily living that may affect Transfer Assistance: None risk of falls: Patient Identification Verified: Yes Signs or symptoms of abuse/neglect since last visito No Secondary Verification Process Completed: Yes Hospitalized since last visit: No Patient Requires Transmission-Based Precautions: No Implantable device outside of the clinic excluding No Patient Has Alerts: Yes cellular tissue based products placed in the center Patient Alerts: R ABI = 1.0 since last visit: L ABI = 1.1 Has Dressing in Place as Prescribed: Yes Has Compression in Place as Prescribed: Yes Pain Present Now: No Electronic Signature(s) Signed: 05/27/2020 5:06:40 PM By: Baruch Gouty RN, BSN Entered By: Baruch Gouty on 05/27/2020 08:13:49 -------------------------------------------------------------------------------- Compression Therapy Details Patient Name: Date of Service: [name] [name] [name] 05/27/2020 8:00 [name] M Medical Record Number: [id number] Patient Account Number: [id number] Date of Birth/Sex: Treating RN: 08/17/1986 (34 y.o. [name] [name] Primary Care Lynisha Osuch: [place], [place] Other Clinician: Referring Ambika Zettlemoyer: Treating Arseniy Toomey/Extender: [name]: 229 Compression Therapy  Performed for Wound Assessment: Wound #37 Left,Lateral Malleolus Performed By: Clinician Kela Millin, RN Compression Type: Four Layer Electronic Signature(s) Signed: 05/27/2020 5:06:40 PM By: Baruch Gouty RN, BSN Entered By: Baruch Gouty on 05/27/2020 08:22:00 -------------------------------------------------------------------------------- Encounter Discharge Information Details Patient Name: Date of Service: [name] [name] [name] 05/27/2020 8:00 [name] M Medical Record Number: [id number] Patient Account Number: [id number] Date of Birth/Sex: Treating RN: May 03, 1986 (34 y.o. [name] [name] Primary Care Noreene Boreman: [place], [place] Other Clinician: Referring Davene Jobin: Treating Daymen Hassebrock/Extender: [name]: 229 Encounter Discharge Information Items Discharge Condition: Stable Ambulatory Status: Wheelchair Discharge Destination: Home Transportation: Private Auto Accompanied By: self Schedule Follow-up Appointment: Yes Clinical Summary of Care: Patient Declined Electronic Signature(s) Signed: 05/27/2020 5:06:40 PM By: Baruch Gouty RN, BSN Entered By: Baruch Gouty on 05/27/2020 08:24:50 -------------------------------------------------------------------------------- Patient/Caregiver Education Details Patient Name: Date of Service: [name] [name] [name] 7/6/2021andnbsp8:00 [name] M Medical Record Number: [id number] Patient Account Number: [id number] Date of Birth/Gender: Treating RN: 12/16/1985 (34 y.o. [name] [name] Primary Care Physician: Janine Limbo Other Clinician: Referring Physician: Treating Physician/Extender: [name]: 229 Education Assessment Education Provided To: Patient Education Topics Provided Pressure: Methods: Explain/Verbal Responses: Reinforcements needed, State content correctly Wound/Skin Impairment: Methods: Explain/Verbal Responses: Reinforcements needed, State content  correctly Electronic Signature(s) Signed: 05/27/2020 5:06:40 PM By: Baruch Gouty RN, BSN Entered By: Baruch Gouty on 05/27/2020 08:24:33 -------------------------------------------------------------------------------- Wound Assessment Details Patient Name: Date of Service: [name] [name] [name] 05/27/2020 8:00 [name] M Medical Record Number: [id number] Patient Account Number: [id number] Date of Birth/Sex: Treating RN: 11/03/1986 (34 y.o. [name] [name] Primary Care Loana Salvaggio: [place], [place] Other Clinician: Referring Clarisse Rodriges: Treating Ayme Short/Extender: [name]: 229 Wound Status Wound Number: 37 Primary Etiology: Venous Leg Ulcer Wound Location: Left, Lateral Malleolus Wound Status: Open Wounding Event: Gradually Appeared Comorbid History: Sleep Apnea, Hypertension, Paraplegia Date Acquired: 11/26/2019 Weeks Of Treatment: 26 Clustered Wound: No  Wound Measurements Length: (cm) 0.6 Width: (cm) 0.5 Depth: (cm) 0.1 Area: (cm) 0.236 Volume: (cm) 0.024 % Reduction in Area: 94% % Reduction in Volume: 93.9% Epithelialization: Medium (34-66%) Tunneling: No Undermining: No Wound Description Classification: Full Thickness Without Exposed Support Structures Wound Margin: Flat and Intact Exudate Amount: Small Exudate Type: Serosanguineous Exudate Color: red, brown Foul Odor After Cleansing: No Slough/Fibrino Yes Wound Bed Granulation Amount: Large (67-100%) Exposed Structure Granulation Quality: Red Fascia Exposed: No Necrotic Amount: None Present (0%) Fat Layer (Subcutaneous Tissue) Exposed: Yes Tendon Exposed: No Muscle Exposed: No Joint Exposed: No Bone Exposed: No Treatment Notes Wound #37 (Left, Lateral Malleolus) 2. Periwound Care Moisturizing lotion TCA Cream 3. Primary Dressing Applied Hydrofera Blue 4. Secondary Dressing Dry Gauze 6. Support Layer Applied 4 layer compression Water quality scientist) Signed:  05/27/2020 5:06:40 PM By: Baruch Gouty RN, BSN Entered By: Baruch Gouty on 05/27/2020 08:20:19 -------------------------------------------------------------------------------- Wound Assessment Details Patient Name: Date of Service: [name] [name] [name] 05/27/2020 8:00 [name] M Medical Record Number: [id number] Patient Account Number: [id number] Date of Birth/Sex: Treating RN: October 30, 1986 (34 y.o. [name] [name] Primary Care Edger Husain: [name], [name] Other Clinician: Referring Marlene Beidler: Treating Korry Dalgleish/Extender: [name]: 229 Wound Status Wound Number: 38 Primary Etiology: Inflammatory Wound Location: Right T - Web between 1st and 2nd oe Wound Status: Open Wounding Event: Gradually Appeared Comorbid History: Sleep Apnea, Hypertension, Paraplegia Date Acquired: 11/30/2019 Weeks Of Treatment: 25 Clustered Wound: No Wound Measurements Length: (cm) 4 Width: (cm) 4 Depth: (cm) 0.2 Area: (cm) 12.566 Volume: (cm) 2.513 % Reduction in Area: -3707.9% % Reduction in Volume: -987.9% Epithelialization: Medium (34-66%) Tunneling: No Undermining: No Wound Description Classification: Full Thickness Without Exposed Support Structures Wound Margin: Distinct, outline attached Exudate Amount: Small Exudate Type: Serosanguineous Exudate Color: red, brown Foul Odor After Cleansing: No Slough/Fibrino Yes Wound Bed Granulation Amount: Large (67-100%) Exposed Structure Granulation Quality: Red Fat Layer (Subcutaneous Tissue) Exposed: Yes Necrotic Amount: Small (1-33%) Necrotic Quality: Adherent Slough Treatment Notes Wound #38 (Right Toe - Web between 1st and 2nd) 3. Primary Dressing Applied Calcium Alginate Ag 4. Secondary Dressing Dry Gauze Roll Gauze 6. Support Layer Applied Other support layer (specify in notes) Notes antifungal cream to toes, juxtalite Electronic Signature(s) Signed: 05/27/2020 5:06:40 PM By: Baruch Gouty RN, BSN Entered  By: Baruch Gouty on 05/27/2020 08:21:05 -------------------------------------------------------------------------------- Wound Assessment Details Patient Name: Date of Service: [name] [name] [name] 05/27/2020 8:00 [name] M Medical Record Number: [id number] Patient Account Number: [id number] Date of Birth/Sex: Treating RN: 02/16/1986 (34 y.o. [name] [name] Primary Care Maleiyah Releford: [name], [name] Other Clinician: Referring Allanah Mcfarland: Treating Overton Boggus/Extender: [name]: 229 Wound Status Wound Number: 41 Primary Etiology: Pressure Ulcer Wound Location: Left Ischium Wound Status: Open Wounding Event: Gradually Appeared Comorbid History: Sleep Apnea, Hypertension, Paraplegia Date Acquired: 03/16/2020 Weeks Of Treatment: 10 Clustered Wound: No Wound Measurements Length: (cm) 4 Width: (cm) 1.5 Depth: (cm) 0.1 Area: (cm) 4.712 Volume: (cm) 0.471 Wound Description Classification: Category/Stage II Wound Margin: Distinct, outline attached Exudate Amount: Medium Exudate Type: Serosanguineous Exudate Color: red, brown Foul Odor After Cleansing: Slough/Fibrino % Reduction in Area: -160.9% % Reduction in Volume: -160.2% Epithelialization: Medium (34-66%) Tunneling: No Undermining: No No No Wound Bed Granulation Amount: Large (67-100%) Exposed Structure Granulation Quality: Pink, Friable Fascia Exposed: No Necrotic Amount: None Present (0%) Fat Layer (Subcutaneous Tissue) Exposed: Yes Tendon Exposed: No Muscle Exposed: No Joint Exposed: No Bone Exposed: No Treatment Notes Wound #41 (Left Ischium) 3. Primary  Dressing Applied Hydrofera Blue 4. Secondary Dressing Foam Border Dressing Electronic Signature(s) Signed: 05/27/2020 5:06:40 PM By: Baruch Gouty RN, BSN Entered By: Baruch Gouty on 05/27/2020 08:21:31 -------------------------------------------------------------------------------- [place] Details Patient Name: Date of  Service: [name] [name] [name] 05/27/2020 8:00 [name] M Medical Record Number: [id number] Patient Account Number: [id number] Date of Birth/Sex: Treating RN: Feb 24, 1986 (34 y.o. [name] [name] Primary Care Jaedyn Lard: [place], [place] Other Clinician: Referring Ayvion Kavanagh: Treating Kaye Mitro/Extender: [name]: 229 Vital Signs Time Taken: 08:13 Temperature (F): 98.4 Height (in): 70 Pulse (bpm): 114 Source: Stated Respiratory Rate (breaths/min): 18 Weight (lbs): 216 Blood Pressure (mmHg): 119/71 Source: Stated Reference Range: 80 - 120 mg / dl Body Mass Index (BMI): 31 Electronic Signature(s) Signed: 05/27/2020 5:06:40 PM By: Baruch Gouty RN, BSN Entered By: Baruch Gouty on 05/27/2020 08:14:30

## 2020-05-27 NOTE — Progress Notes (Signed)
Masoner, AYUB KIRSH ([id number]) Visit Report for 05/27/2020 SuperBill Details Patient Name: Date of Service: [name], [name] 05/27/2020 Medical Record Number: [id number] Patient Account Number: [id number] Date of Birth/Sex: Treating RN: 1986/06/21 (34 y.o. 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

## 2020-06-02 ENCOUNTER — Encounter (HOSPITAL_BASED_OUTPATIENT_CLINIC_OR_DEPARTMENT_OTHER): Payer: BC Managed Care – PPO | Admitting: Internal Medicine

## 2020-06-02 DIAGNOSIS — Z87891 Personal history of nicotine dependence: Secondary | ICD-10-CM | POA: Diagnosis not present

## 2020-06-02 DIAGNOSIS — I87332 Chronic venous hypertension (idiopathic) with ulcer and inflammation of left lower extremity: Secondary | ICD-10-CM | POA: Diagnosis not present

## 2020-06-02 DIAGNOSIS — L97311 Non-pressure chronic ulcer of right ankle limited to breakdown of skin: Secondary | ICD-10-CM | POA: Diagnosis not present

## 2020-06-02 DIAGNOSIS — Z8614 Personal history of Methicillin resistant Staphylococcus aureus infection: Secondary | ICD-10-CM | POA: Diagnosis not present

## 2020-06-02 DIAGNOSIS — I890 Lymphedema, not elsewhere classified: Secondary | ICD-10-CM | POA: Diagnosis not present

## 2020-06-02 DIAGNOSIS — L97321 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 to breakdown of skin: Secondary | ICD-10-CM | POA: Diagnosis not present

## 2020-06-02 DIAGNOSIS — L89323 Pressure ulcer of left buttock, stage 3: Secondary | ICD-10-CM | POA: Diagnosis not present

## 2020-06-02 DIAGNOSIS — G8221 Paraplegia, complete: Secondary | ICD-10-CM | POA: Diagnosis not present

## 2020-06-02 DIAGNOSIS — L97521 Non-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: Secondary | ICD-10-CM | POA: Diagnosis not present

## 2020-06-02 DIAGNOSIS — L97511 Non-pressure chronic ulcer of other part of right foot limited to breakdown of skin: Secondary | ICD-10-CM | POA: Diagnosis not present

## 2020-06-02 DIAGNOSIS — L03116 Cellulitis of left lower limb: Secondary | ICD-10-CM | POA: Diagnosis not present

## 2020-06-02 DIAGNOSIS — Z87440 Personal history of urinary (tract) infections: Secondary | ICD-10-CM | POA: Diagnosis not present

## 2020-06-03 NOTE — Progress Notes (Addendum)
[name], [name] ([id number]) Visit Report for 06/02/2020 Arrival Information Details Patient Name: Date of Service: Digman, [name] 06/02/2020 7:30 A M Medical Record Number: [id number] Patient Account Number: [id number] Date of Birth/Sex: Treating RN: 1986-10-23 (34 y.o. [name] Primary Care Woodie Degraffenreid: [name], [name] Other Clinician: Referring Lundy Cozart: Treating Sequita Wise/Extender: [name]: 41 Visit Information History Since Last Visit Added or deleted any medications: No Patient Arrived: Wheel Chair Any new allergies or adverse reactions: No Arrival Time: 07:45 Had a fall or experienced change in No Accompanied By: self activities of daily living that may affect Transfer Assistance: None risk of falls: Patient Identification Verified: Yes Signs or symptoms of abuse/neglect since last visito No Secondary Verification Process Completed: Yes Hospitalized since last visit: No Patient Requires Transmission-Based Precautions: No Implantable device outside of the clinic excluding No Patient Has Alerts: Yes cellular tissue based products placed in the center Patient Alerts: R ABI = 1.0 since last visit: L ABI = 1.1 Has Dressing in Place as Prescribed: Yes Pain Present Now: No Electronic Signature(s) Signed: 06/02/2020 2:22:26 PM By: Sandre Kitty Entered By: Sandre Kitty on 06/02/2020 07:45:52 -------------------------------------------------------------------------------- Clinic Level of Care Assessment Details Patient Name: Date of Service: [name], [name] 06/02/2020 7:30 A M Medical Record Number: [id number] Patient Account Number: [id number] Date of Birth/Sex: Treating RN: [birth year] (34 y.o. [name] Primary Care Francess Mullen: [name], [name] Other Clinician: Referring Kou Gucciardo: Treating Anthonny Schiller/Extender: [name]: 62 Clinic Level of Care Assessment Items TOOL 4 Quantity  Score X- 1 0 Use when only an EandM is performed on FOLLOW-UP visit ASSESSMENTS - Nursing Assessment / Reassessment X- 1 10 Reassessment of Co-morbidities (includes updates in patient status) X- 1 5 Reassessment of Adherence to Treatment Plan ASSESSMENTS - Wound and Skin A ssessment / Reassessment []  - 0 Simple Wound Assessment / Reassessment - one wound X- 2 5 Complex Wound Assessment / Reassessment - multiple wounds []  - 0 Dermatologic / Skin Assessment (not related to wound area) ASSESSMENTS - Focused Assessment []  - 0 Circumferential Edema Measurements - multi extremities []  - 0 Nutritional Assessment / Counseling / Intervention []  - 0 Lower Extremity Assessment (monofilament, tuning fork, pulses) []  - 0 Peripheral Arterial Disease Assessment (using hand held doppler) ASSESSMENTS - Ostomy and/or Continence Assessment and Care []  - 0 Incontinence Assessment and Management []  - 0 Ostomy Care Assessment and Management (repouching, etc.) PROCESS - Coordination of Care X - Simple Patient / Family Education for ongoing care 1 15 []  - 0 Complex (extensive) Patient / Family Education for ongoing care X- 1 10 Staff obtains Programmer, systems, Records, T Results / Process Orders est []  - 0 Staff telephones HHA, Nursing Homes / Clarify orders / etc []  - 0 Routine Transfer to another Facility (non-emergent condition) []  - 0 Routine Hospital Admission (non-emergent condition) []  - 0 New Admissions / Biomedical engineer / Ordering NPWT Apligraf, etc. , []  - 0 Emergency Hospital Admission (emergent condition) X- 1 10 Simple Discharge Coordination []  - 0 Complex (extensive) Discharge Coordination PROCESS - Special Needs []  - 0 Pediatric / Minor Patient Management []  - 0 Isolation Patient Management []  - 0 Hearing / Language / Visual special needs []  - 0 Assessment of Community assistance (transportation, D/C planning, etc.) []  - 0 Additional assistance / Altered  mentation []  - 0 Support Surface(s) Assessment (bed, cushion, seat, etc.) INTERVENTIONS - Wound Cleansing / Measurement []  - 0 Simple Wound Cleansing - one wound  X- 2 5 Complex Wound Cleansing - multiple wounds X- 1 5 Wound Imaging (photographs - any number of wounds) []  - 0 Wound Tracing (instead of photographs) []  - 0 Simple Wound Measurement - one wound X- 2 5 Complex Wound Measurement - multiple wounds INTERVENTIONS - Wound Dressings X - Small Wound Dressing one or multiple wounds 2 10 []  - 0 Medium Wound Dressing one or multiple wounds []  - 0 Large Wound Dressing one or multiple wounds X- 1 5 Application of Medications - topical []  - 0 Application of Medications - injection INTERVENTIONS - Miscellaneous []  - 0 External ear exam []  - 0 Specimen Collection (cultures, biopsies, blood, body fluids, etc.) []  - 0 Specimen(s) / Culture(s) sent or taken to Lab for analysis []  - 0 Patient Transfer (multiple staff / Civil Service fast streamer / Similar devices) []  - 0 Simple Staple / Suture removal (25 or less) []  - 0 Complex Staple / Suture removal (26 or more) []  - 0 Hypo / Hyperglycemic Management (close monitor of Blood Glucose) []  - 0 Ankle / Brachial Index (ABI) - do not check if billed separately X- 1 5 Vital Signs Has the patient been seen at the hospital within the last three years: Yes Total Score: 115 Level Of Care: New/Established - Level 3 Electronic Signature(s) Signed: 06/02/2020 5:04:17 PM By: Levan Hurst RN, BSN Entered By: Levan Hurst on 06/02/2020 08:34:07 -------------------------------------------------------------------------------- Encounter Discharge Information Details Patient Name: Date of Service: [name], [name] 06/02/2020 7:30 A M Medical Record Number: [id number] Patient Account Number: [id number] Date of Birth/Sex: Treating RN: 1986/10/12 (34 y.o. [name] Primary Care Kayvan Hoefling: [place], [place] Other Clinician: Referring  Green Quincy: Treating Lajoy Vanamburg/Extender: [name]: 230 Encounter Discharge Information Items Discharge Condition: Stable Ambulatory Status: Wheelchair Discharge Destination: Home Transportation: Other Accompanied By: self Schedule Follow-up Appointment: Yes Clinical Summary of Care: Patient Declined Electronic Signature(s) Signed: 06/03/2020 7:38:35 AM By: Kela Millin Entered By: Kela Millin on 06/02/2020 08:49:05 -------------------------------------------------------------------------------- Multi Wound Chart Details Patient Name: Date of Service: [name], [name] 06/02/2020 7:30 A M Medical Record Number: [id number] Patient Account Number: [id number] Date of Birth/Sex: Treating RN: [birth year] (34 y.o. [name] Primary Care Ariah Mower: [name], [name] Other Clinician: Referring Anant Agard: Treating Yogesh Cominsky/Extender: Dutch Gray, [name]: 230 Vital Signs Height(in): 70 Pulse(bpm): 101 Weight(lbs): 216 Blood Pressure(mmHg): 116/72 Body Mass Index(BMI): 31 Temperature(F): 98.2 Respiratory Rate(breaths/min): 18 Photos: [37:No Photos Left, Lateral Malleolus] [38:No 28 Right T - Web between 1st and 2nd Left Ischium oe] [41:No Photos] Wound Location: [37:Gradually Appeared] [38:Gradually Appeared] [41:Gradually Appeared] Wounding Event: [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] [38:Inflammatory] [41:Pressure Ulcer] Primary Etiology: [37:Sleep Apnea, Hypertension, Paraplegia Sleep Apnea, Hypertension, Paraplegia Sleep Apnea, Hypertension, Paraplegia] Comorbid History: [37:11/26/2019] [38:11/30/2019] [41:03/16/2020] Date Acquired: [37:27] [38:26] [41:11] Weeks of Treatment: [37:Healed - Epithelialized] [38:Open] [41:Open] Wound Status: [37:0x0x0] [38:0.8x0.2x0.3] [41:2.8x1.5x0.1] Measurements L x W x D (cm) [37:0] [38:0.126] [41:3.299] A (cm) : rea [37:0] [38:0.038] [41:0.33] Volume (cm) : [37:100.00%] [38:61.80%]  [41:-82.70%] % Reduction in Area: [37:100.00%] [38:83.50%] [41:-82.30%] % Reduction in Volume: [37:Full Thickness Without Exposed] [38:Full Thickness Without Exposed] [41:Category/Stage II] Classification: [37:Support Structures None Present] [38:Support Structures Small] [41:Medium] Exudate Amount: [37:N/A] [38:Serosanguineous] [41:Serosanguineous] Exudate Type: [37:N/A] [38:red, brown] [41:red, brown] Exudate Color: [37:Flat and Intact] [38:Distinct, outline attached] [41:Distinct, outline attached] Wound Margin: [37:None Present (0%)] [38:Large (67-100%)] [41:Large (67-100%)] Granulation Amount: [37:N/A] [38:Red] [41:Pink, Friable] Granulation Quality: [37:None Present (0%)] [38:Small (1-33%)] [41:None Present (0%)] Necrotic Amount: [37:Fascia: No] [38:Fat Layer (Subcutaneous Tissue)] [  41:Fat Layer (Subcutaneous Tissue)] Exposed Structures: [37:Fat Layer (Subcutaneous Tissue) Exposed: No Tendon: No Muscle: No Joint: No Bone: No Large (67-100%)] [38:Exposed: Yes Medium (34-66%)] [41:Exposed: Yes Fascia: No Tendon: No Muscle: No Joint: No Bone: No Medium (34-66%)] Treatment Notes Electronic Signature(s) Signed: 06/02/2020 5:04:17 PM By: Levan Hurst RN, BSN Signed: 06/03/2020 8:12:39 AM By: Linton Ham MD Entered By: Linton Ham on 06/02/2020 08:38:15 -------------------------------------------------------------------------------- Multi-Disciplinary Care Plan Details Patient Name: Date of Service: [place], [name] 06/02/2020 7:30 A M Medical Record Number: [id number] Patient Account Number: [id number] Date of Birth/Sex: Treating RN: Dec 04, 1985 (34 y.o. [name] Primary Care Theoplis Garciagarcia: [name], [name] Other Clinician: Referring Azahel Belcastro: Treating Belinda Bringhurst/Extender: [name]: 230 Active Inactive Wound/Skin Impairment Nursing Diagnoses: Impaired tissue integrity Knowledge deficit related to ulceration/compromised skin  integrity Goals: Patient/caregiver will verbalize understanding of skin care regimen Date Initiated: 01/05/2016 Target Resolution Date: 06/13/2020 Goal Status: Active Ulcer/skin breakdown will have a volume reduction of 30% by week 4 Date Initiated: 01/05/2016 Date Inactivated: 12/22/2017 Target Resolution Date: 01/19/2018 Unmet Reason: complex wounds, Goal Status: Unmet infection Interventions: Assess patient/caregiver ability to obtain necessary supplies Assess ulceration(s) every visit Provide education on ulcer and skin care Notes: Electronic Signature(s) Signed: 06/02/2020 5:04:17 PM By: Levan Hurst RN, BSN Signed: 06/02/2020 5:04:17 PM By: Levan Hurst RN, BSN Entered By: Levan Hurst on 06/02/2020 08:33:03 -------------------------------------------------------------------------------- Pain Assessment Details Patient Name: Date of Service: Bonny, [name] 06/02/2020 7:30 A M Medical Record Number: [id number] Patient Account Number: [id number] Date of Birth/Sex: Treating RN: [birth year] (34 y.o. [name] Primary Care Moriya Mitchell: [place], [place] Other Clinician: Referring Taiga Lupinacci: Treating Deaken Jurgens/Extender: [name]: 230 Active Problems Location of Pain Severity and Description of Pain Patient Has Paino No Site Locations Pain Management and Medication Current Pain Management: Electronic Signature(s) Signed: 06/02/2020 2:22:26 PM By: Sandre Kitty Signed: 06/02/2020 5:04:17 PM By: Levan Hurst RN, BSN Entered By: Sandre Kitty on 06/02/2020 07:46:18 -------------------------------------------------------------------------------- Patient/Caregiver Education Details Patient Name: Date of Service: [place], A Viviann Spare 7/12/2021andnbsp7:30 A M Medical Record Number: [id number] Patient Account Number: [id number] Date of Birth/Gender: Treating RN: [birth year] (34 y.o. [name] Primary Care Physician: Janine Limbo Other Clinician: Referring Physician: Treating Physician/Extender: [name]: 58 Education Assessment Education Provided To: Patient Education Topics Provided Wound/Skin Impairment: Methods: Explain/Verbal Responses: State content correctly Electronic Signature(s) Signed: 06/02/2020 5:04:17 PM By: Levan Hurst RN, BSN Entered By: Levan Hurst on 06/02/2020 08:33:24 -------------------------------------------------------------------------------- Wound Assessment Details Patient Name: Date of Service: Urwin, [name] 06/02/2020 7:30 A M Medical Record Number: [id number] Patient Account Number: [id number] Date of Birth/Sex: Treating RN: 1986/04/01 (34 y.o. [name] Primary Care Trigger Frasier: [name], [name] Other Clinician: Referring Aysha Livecchi: Treating Lacye Mccarn/Extender: [name]: 230 Wound Status Wound Number: [id number] Primary Etiology: Venous Leg Ulcer Wound Location: Left, Lateral Malleolus Wound Status: Healed - Epithelialized Wounding Event: Gradually Appeared Comorbid History: Sleep Apnea, Hypertension, Paraplegia Date Acquired: 11/26/2019 Weeks Of Treatment: 27 Clustered Wound: No Photos Photo Uploaded By: Mikeal Hawthorne on 06/02/2020 14:13:43 Wound Measurements Length: (cm) Width: (cm) Depth: (cm) Area: (cm) Volume: (cm) 0 % Reduction in Area: 100% 0 % Reduction in Volume: 100% 0 Epithelialization: Large (67-100%) 0 Tunneling: No 0 Undermining: No Wound Description Classification: Full Thickness Without Exposed Support Structures Wound Margin: Flat and Intact Exudate Amount: None Present Foul Odor After Cleansing: No Slough/Fibrino Yes Wound Bed Granulation Amount: None Present (0%)  Exposed Structure Necrotic Amount: None Present (0%) Fascia Exposed: No Fat Layer (Subcutaneous Tissue) Exposed: No Tendon Exposed: No Muscle Exposed: No Joint Exposed: No Bone  Exposed: No Electronic Signature(s) Signed: 06/02/2020 5:04:17 PM By: Levan Hurst RN, BSN Entered By: Levan Hurst on 06/02/2020 08:29:33 -------------------------------------------------------------------------------- Wound Assessment Details Patient Name: Date of Service: [place], [name] 06/02/2020 7:30 A M Medical Record Number: [id number] Patient Account Number: [id number] Date of Birth/Sex: Treating RN: [birth year] (34 y.o. [name] Primary Care Rickie Gutierres: [name], [name] Other Clinician: Referring Dhriti Fales: Treating Ronneisha Jett/Extender: Dutch Gray, [name]: 230 Wound Status Wound Number: 38 Primary Etiology: Inflammatory Wound Location: Right T - Web between 1st and 2nd oe Wound Status: Open Wounding Event: Gradually Appeared Comorbid History: Sleep Apnea, Hypertension, Paraplegia Date Acquired: 11/30/2019 Weeks Of Treatment: 26 Clustered Wound: No Photos Photo Uploaded By: Mikeal Hawthorne on 06/02/2020 14:13:43 Wound Measurements Length: (cm) 0.8 Width: (cm) 0.2 Depth: (cm) 0.3 Area: (cm) 0.126 Volume: (cm) 0.038 % Reduction in Area: 61.8% % Reduction in Volume: 83.5% Epithelialization: Medium (34-66%) Tunneling: No Undermining: No Wound Description Classification: Full Thickness Without Exposed Support Structures Wound Margin: Distinct, outline attached Exudate Amount: Small Exudate Type: Serosanguineous Exudate Color: red, brown Foul Odor After Cleansing: No Slough/Fibrino Yes Wound Bed Granulation Amount: Large (67-100%) Exposed Structure Granulation Quality: Red Fat Layer (Subcutaneous Tissue) Exposed: Yes Necrotic Amount: Small (1-33%) Necrotic Quality: Adherent Slough Treatment Notes Wound #38 (Right Toe - Web between 1st and 2nd) 1. Cleanse With Wound Cleanser 2. Periwound Care TCA Cream 3. Primary Dressing Applied Calcium Alginate Ag 4. Secondary Dressing Dry Gauze Roll Gauze 5. Secured  With Tape Notes antifungal cream to toes, juxtalite Electronic Signature(s) Signed: 06/02/2020 5:04:17 PM By: Levan Hurst RN, BSN Signed: 06/02/2020 5:13:16 PM By: Carlene Coria RN Entered By: Carlene Coria on 06/02/2020 08:07:59 -------------------------------------------------------------------------------- Wound Assessment Details Patient Name: Date of Service: [place], [name] 06/02/2020 7:30 A M Medical Record Number: [id number] Patient Account Number: [id number] Date of Birth/Sex: Treating RN: 01-05-1986 (34 y.o. [name] Primary Care Hau Sanor: [name], [name] Other Clinician: Referring Franciso Dierks: Treating Tyashia Morrisette/Extender: Dutch Gray, [name]: 230 Wound Status Wound Number: 41 Primary Etiology: Pressure Ulcer Wound Location: Left Ischium Wound Status: Open Wounding Event: Gradually Appeared Comorbid History: Sleep Apnea, Hypertension, Paraplegia Date Acquired: 03/16/2020 Weeks Of Treatment: 11 Clustered Wound: No Photos Photo Uploaded By: Mikeal Hawthorne on 06/02/2020 14:13:14 Wound Measurements Length: (cm) 2.8 Width: (cm) 1.5 Depth: (cm) 0.1 Area: (cm) 3.299 Volume: (cm) 0.33 % Reduction in Area: -82.7% % Reduction in Volume: -82.3% Epithelialization: Medium (34-66%) Tunneling: No Undermining: No Wound Description Classification: Category/Stage II Wound Margin: Distinct, outline attached Exudate Amount: Medium Exudate Type: Serosanguineous Exudate Color: red, brown Foul Odor After Cleansing: No Slough/Fibrino No Wound Bed Granulation Amount: Large (67-100%) Exposed Structure Granulation Quality: Pink, Friable Fascia Exposed: No Necrotic Amount: None Present (0%) Fat Layer (Subcutaneous Tissue) Exposed: Yes Tendon Exposed: No Muscle Exposed: No Joint Exposed: No Bone Exposed: No Treatment Notes 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 Cleanse With Wound Cleanser 2. Periwound Care Skin Prep 3. Primary Dressing  Applied Hydrofera Blue 4. Secondary Dressing Foam Border Dressing Electronic Signature(s) Signed: 06/02/2020 5:04:17 PM By: Levan Hurst RN, BSN Signed: 06/02/2020 5:13:16 PM By: Carlene Coria RN Entered By: Carlene Coria on 06/02/2020 01:60:10 -------------------------------------------------------------------------------- [place] Details Patient Name: Date of Service: Chalmers, [name] 06/02/2020 7:30 A M Medical Record Number: [id number] Patient Account Number: [id number] Date of Birth/Sex: Treating RN: 1985-11-30 (  34 y.o. [name] Primary Care Erman Thum: [place], [place] Other Clinician: Referring Suprina [place]: Treating Denham Mose/Extender: [name]: 230 Vital Signs Time Taken: 07:45 Temperature (F): 98.2 Height (in): 70 Pulse (bpm): 101 Weight (lbs): 216 Respiratory Rate (breaths/min): 18 Body Mass Index (BMI): 31 Blood Pressure (mmHg): 116/72 Reference Range: 80 - 120 mg / dl Electronic Signature(s) Signed: 06/02/2020 2:22:26 PM By: Sandre Kitty Entered By: Sandre Kitty on 06/02/2020 07:46:10

## 2020-06-03 NOTE — Progress Notes (Addendum)
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 better. 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 Problematic areas on the plantar aspect of the first second toe web space on the left and the dorsal aspect of the first second toe web space on the right. At one point I felt these were probably related to chronic fungal infections in treated him aggressively for this although we have not made any improvement here. 08/11/17; left lateral leg. Surface continues to improve with the Iodoflex although we are not seeing much improvement in overall wound dimensions. Areas on his plantar left foot and right foot show no improvement. In fact the right foot looks somewhat worse 08/18/17; left lateral leg. We changed to [facility] Blue last week after [name] prolonged course of Iodoflex which helps get the surface better. It appears that the wound with is improved. Continue with difficult areas on the left dorsal first second and plantar first second on the right  09/01/17; patient arrives in clinic today having had [name] temperature of 103 yesterday. He was seen in the ER and [facility]. The patient was concerned he could have cellulitis again in the right leg however they diagnosed him with [name] UTI and he is now on Keflex. He has [name] history of cellulitis which is been recurrent and difficult but this is been in the left leg, in the past 5 use doxycycline. He does in and out catheterizations at home which are risk factors for UTI 09/08/17; patient will be completing his Keflex this weekend. The erythema on the left leg is considerably better. He has [name] new  wound today on the medial part of the right leg small superficial almost looks like [name] skin tear. He has worsening of the area on the right dorsal first and second toe. His major area on the left lateral leg is better. Using Hydrofera Blue on all areas 09/15/17; gradual reduction in width on the long wound in the left lateral leg. No debridement required. He also has wounds on the plantar aspect of his left first second toe web space and on the dorsal aspect of the right first second toe web space. 09/22/17; there continues to be very gradual improvements in the dimensions of the left lateral leg wound. He hasn't round erythematous spot with might be pressure on his wheelchair. There is no evidence obviously of infection no purulence no warmth He has [name] dry scaled area on the plantar aspect of the left first second toe Improved area on the dorsal right first second toe. 09/29/17; left lateral leg wound continues to improve in dimensions mostly with an is still [name] fairly long but increasingly narrow wound. He has [name] dry scaled area on the plantar aspect of his left first second toe web space Increasingly concerning area on the dorsal right first second toe. In fact I am concerned today about possible cellulitis around this wound. The areas extending up his second toe and although there is deformities here almost appears to abut on the nailbed. 10/06/17; left lateral leg wound continues to make very gradual progress. Tissue culture I did from the right first second toe dorsal foot last time grew MRSA and enterococcus which was vancomycin sensitive. This was not sensitive to clindamycin or doxycycline. He is allergic to Zyvox and sulfa we have therefore arrange for him to have dalvance infusion tomorrow. He is had this in the past and tolerated it well 10/20/17; left lateral leg wound continues to make decent progress. This is certainly reduced in terms of with there is advancing epithelialization.The  cellulitis in the right foot looks better although he still has [name] deep wound in the dorsal aspect of the first second toe web space. Plantar left first toe web space on the left I think is making some progress 10/27/17; left lateral leg wound continues to make decent progress. Advancing epithelialization.using Hydrofera Blue The right first second toe web space wound is better-looking using silver alginate Improvement in the left plantar first second toe web space. Again using silver alginate 11/03/17 left lateral leg wound continues to make decent progress albeit slowly. Using [facility] The right per second toe web space continues to be [name] very problematic looking punched out wound. I obtained [name] piece of tissue for deep culture I did extensively treated this for fungus. It is difficult to imagine that this is [name] pressure area as the patient states other than going outside he doesn't really wear shoes at home The  left plantar first second toe web space looked fairly senescent. Necrotic edges. This required debridement change to [facility] Blue to all wound areas 11/10/17; left lateral leg wound continues to contract. Using Hydrofera Blue On the right dorsal first second toe web space dorsally. Culture I did of this area last week grew MRSA there is not an easy oral option in this patient was multiple antibiotic allergies or intolerances. This was only [name] rare culture isolate I'm therefore going to use Bactroban under silver alginate On the left plantar first second toe web space. Debridement is required here. This is also unchanged 11/17/17; left lateral leg wound continues to contract using Hydrofera Blue this is no longer the major issue. The major concern here is the right first second toe web space. He now has an open area going from dorsally to the plantar aspect. There is now wound on the inner lateral part of the first toe. Not [name] very viable surface on this. There is erythema spreading  medially into the forefoot. No major change in the left first second toe plantar wound 11/24/17; left lateral leg wound continues to contract using Hydrofera Blue. Nice improvement today The right first second toe web space all of this looks [name] lot less angry than last week. I have given him clindamycin and topical Bactroban for MRSA and terbinafine for the possibility of underlining tinea pedis that I could not control with ketoconazole. Looks somewhat better The area on the plantar left first second toe web space is weeping with dried debris around the wound 12/01/17; left lateral leg wound continues to contract he Hydrofera Blue. It is becoming thinner in terms of with nevertheless it is making good improvement. The right first second toe web space looks less angry but still [name] large necrotic-looking wounds starting on the plantar aspect of the right foot extending between the toes and now extensively on the base of the right second toe. I gave him clindamycin and topical Bactroban for MRSA anterior benefiting for the possibility of underlying tinea pedis. Not looking better today The area on the left first/second toe looks better. Debrided of necrotic debris 12/05/17* the patient was worked in urgently today because over the weekend he found blood on his incontinence bad when he woke up. He was found to have an ulcer by his wife who does most of his wound care. He came in today for [place] to look at this. He has not had [name] history of wounds in his buttocks in spite of his paraplegia. 12/08/17; seen in follow-up today at his usual appointment. He was seen earlier this week and found to have [name] new wound on his buttock. We also follow him for wounds on the left lateral leg, left first second toe web space and right first second toe web space 12/15/17; we have been using Hydrofera Blue to the left lateral leg which has improved. The right first second toe web space has also improved. Left first second toe web  space plantar aspect looks stable. The left buttock has worsened using Santyl. Apparently the buttock has drainage 12/22/17; we have been using Hydrofera Blue to the left lateral leg which continues to improve now 2 small wounds separated by normal skin. He tells [place] he had [name] fever up to 100 yesterday he is prone to UTIs but has not noted anything different. He does in and out catheterizations. The area between the first and second toes today does not look good necrotic surface covered with what looks  to be purulent drainage and erythema extending into the third toe. I had gotten this to something that I thought look better last time however it is not look good today. He also has [name] necrotic surface over the buttock wound which is expanded. I thought there might be infection under here so I removed [name] lot of the surface with [name] #5 curet though nothing look like it really needed culturing. He is been using Santyl to this area 12/27/17; his original wound on the left lateral leg continues to improve using Hydrofera Blue. I gave him samples of Baxdella although he was unable to take them out of fear for an allergic reaction ["lump in his throat"].the culture I did of the purulent drainage from his second toe last week showed both enterococcus and [name] set Enterobacter I was also concerned about the erythema on the bottom of his foot although paradoxically although this looks somewhat better today. 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 We've been using silver alginate here. They came up today that he sleeps in [name] recliner, I'm not sure why but I asked him to stop this 01/03/18; his original wound we've been using Hydrofera Blue is now separated into 2 areas. Ulcer on his left buttock is better he is off the recliner and sleeping in bed Finally both wound areas between his first and second toes also looks some  better 01/10/18; his original wound on the left lateral leg is now separated into 2 wounds we've been using Hydrofera Blue Ulcer on his left buttock has some drainage. There is [name] small probing site going into muscle layer superiorly.using silver alginate -He arrives today with [name] deep tissue injury on the left heel The wound on the dorsal aspect of his first second toe on the left looks [name] lot betterusing silver alginate ketoconazole The area on the first second toe web space on the right also looks [name] lot bette 01/17/18; his original wound on the left lateral leg continues to progress using Hydrofera Blue Ulcer on his left buttock also is smaller surface healthier except for [name] small probing site going into the muscle layer superiorly. 2.4 cm of tunneling in this area DTI on his left heel we have only been offloading. Looks better than last week no threatened open no evidence of infection the wound on the dorsal aspect of the first second toe on the left continues to look like it's regressing we have only been using silver alginate and terbinafine orally The area in the first second toe web space on the right also looks to be [name] lot better using silver alginate and terbinafine I think this was prompted by tinea pedis 01/31/18; the patient was hospitalized in [place] last week apparently for [name] complicated UTI. He was discharged on cefepime he does in and out catheterizations. In the hospital he was discovered M I don't mild elevation of AST and ALT and the terbinafine was stopped.predictably the pressure ulcer on s his buttock looks betterusing silver alginate. The area on the left lateral leg also is better using Hydrofera Blue. The area between the first and second toes on the left better. First and second toes on the right still substantial but better. Finally the DTI on the left heel has held together and looks like it's resolving 02/07/18-he is here in follow-up evaluation for multiple ulcerations.  He has new injury to the lateral aspect of  the last issue [name] pressure ulcer, he states this is from adhesive removal trauma. He states he has tried multiple adhesive products with no success. All other ulcers appear stable. The left heel DTI is resolving. We will continue with same treatment plan and follow-up next week. 02/14/18; follow-up for multiple areas. He has [name] new area last week on the lateral aspect of his pressure ulcer more over the posterior trochanter. The original pressure ulcer looks quite stable has healthy granulation. 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 Almost fully epithelialized on the original superior area using Hydrofera Blue DTI on the left heel has peeled off this week to reveal [name] small superficial wound under denuded skin and subcutaneous tissue Both areas between the first and second toes look better including nothing open on the left 02/21/18; The patient's wounds on his left ischial tuberosity and posterior left greater trochanter actually looked better. He has [name] large area of irritation around the area which I think is contact dermatitis. I am doubtful that this is fungal His original wound on the left lateral calf continues to improve we have been using Hydrofera Blue There is no open area in the left first second toe web space although there is [name] lot of thick callus The DTI on the left heel required debridement today of necrotic surface eschar and subcutaneous tissue using silver alginate Finally the area on the right first second toe webspace continues to contract using silver alginate and ketoconazole 02/28/18 Left ischial tuberosity wounds look better using silver alginate. Original wound on the left calf only has one small open area left using Hydrofera Blue DTI on the left heel required debridement mostly removing skin from around this wound surface. Using  silver alginate The areas on the right first/second toe web space using silver alginate and ketoconazole 03/08/18 on evaluation today patient appears to be doing decently well as best I can tell in regard to his wounds. This is the first time that I have seen him as he generally is followed by Dr. Dellia Nims. With that being said none of his wounds appear to be infected he does have an area where there is some skin covering what appears to be [name] new wound on the left dorsal surface of his great toe. This is right at the nail bed. With that being said I do believe that debrided away some of the excess skin can be of benefit in this regard. Otherwise he has been tolerating the dressing changes without complication. 03/14/18; patient arrives today with the multiplicity of wounds that we are following. He has not been systemically unwell Original wound on the left lateral calf now only has 2 small open areas we've been using Hydrofera Blue which should continue The deep tissue injury on the left heel requires debridement today. We've been using silver alginate The left first second toe and the right first second toe are both are reminiscence what I think was tinea pedis. Apparently some of the callus Surface between the toes was removed last week when it started draining. Purulent drainage coming from the wound on the ischial tuberosity on the left. 03/21/18-He is here in follow-up evaluation for multiple wounds. 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 He tolerated debridement. The only change to last week's recommendations is to discontinue antifungal cream between toes. He will follow-up next week  03/28/18; following up for multiple wounds;Concern this week is streaking redness and swelling in the right foot. He is going to need antibiotics for this. 03/31/18; follow-up for right foot cellulitis. Streaking redness and swelling in the  right foot on 03/28/18. 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 I put him on clindamycin 300 mg every 6 and brought him in for [name] quick check. He has an open wound between his first and second toes on the right foot as [name] potential source. 04/04/18; Right foot cellulitis is resolving he is completing clindamycin. This is truly good news Left lateral calf wound which is initial wound only has one small open area inferiorly this is close to healing out. He has compression stockings. We will use Hydrofera Blue right down to the epithelialization of this Nonviable surface on the left heel which was initially pressure with [name] DTI. We've been using Hydrofera Blue. I'm going to switch this back to silver alginate Left first second toe/tinea pedis this looks better using silver alginate Right first second toe tinea pedis using silver alginate Large pressure ulcers on theLeft ischial tuberosity. Small wound here Looks better. I am uncertain about the surface over the large wound. Using silver alginate 04/11/18; Cellulitis in the right foot is resolved Left lateral calf wound which was his original wounds still has 2 tiny open areas remaining this is just about closed Nonviable surface on the left heel is better but still requires debridement Left first second toe/tinea pedis still open using silver alginate Right first second toe wound tinea pedis I asked him to go back to using ketoconazole and silver alginate Large pressure ulcers on the left ischial tuberosity this shear injury here is resolved. Wound is smaller. No evidence of infection using silver alginate 04/18/18; Patient arrives with an intense area of cellulitis in the right mid lower calf extending into the right heel area. Bright red and warm. Smaller area on the left anterior leg. He has [name] significant history of MRSA. He will definitely need antibioticsdoxycycline He now has 2 open areas on the left ischial tuberosity the  original large wound and now [name] satellite area which I think was above his initial satellite areas. Not [name] wonderful surface on this satellite area surrounding erythema which looks like pressure related. His left lateral calf wound again his original wound is just about closed Left heel pressure injury still requiring debridement Left first second toe looks [name] lot better using silver alginate Right first second toe also using silver alginate and ketoconazole cream also looks better 04/20/18; the patient was worked in early today out of concerns with his cellulitis on the right leg. I had started him on doxycycline. This was 2 days ago. His wife was concerned about the swelling in the area. Also concerned about the left buttock. He has not been systemically unwell no fever chills. No nausea vomiting or diarrhea 04/25/18; the patient's left buttock wound is continued to deteriorate he is using Hydrofera Blue. He is still completing clindamycin for the cellulitis on the right leg although all of this looks better. 05/02/18 Left buttock wound still with [name] lot of drainage and [name] very tightly adherent fibrinous necrotic surface. He has [name] deeper area superiorly The left lateral calf wound is still closed DTI wound on the left heel necrotic surface especially the circumference using Iodoflex Areas between his left first second toe and right first second toe both look better. Dorsally and the right first second toe he  had [name] necrotic surface although at smaller. In using silver alginate and ketoconazole. I did [name] culture last week which was [name] deep tissue culture of the reminiscence of the open wound on the right first second toe dorsally. This grew [name] few Acinetobacter and [name] few methicillin-resistant staph aureus. Nevertheless the area actually this week looked better. I didn't feel the need to specifically address this at least in terms of systemic antibiotics. 05/09/18; wounds are measuring larger more drainage per  our intake. We are using Santyl covered with alginate on the large superficial buttock wounds, Iodosorb on the left heel, ketoconazole and silver alginate to the dorsal first and second toes bilaterally. 05/16/18; The area on his left buttock better in some aspects although the area superiorly over the ischial tuberosity required an extensive debridement.using Santyl Left heel appears stable. Using Iodoflex The areas between his first and second toes are not bad however there is spreading erythema up the dorsal aspect of his left foot this looks like cellulitis again. He is insensate the erythema is really very brilliant.o Erysipelas He went to see an allergist days ago because he was itching part of this he had lab work done. This showed [name] white count of 15.1 with 70% neutrophils. Hemoglobin of 11.4 and [name] platelet count of 659,000. Last white count we had in Epic was [name] 2-1/2 years ago which was 25.9 but he was ill at the time. He was able to show me some lab work that was done by his primary physician the pattern is about the same. 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 But prompted me to go ahead and do x-rays of both feet and the pelvis rule out osteomyelitis. He also had [name] comprehensive metabolic panel this was reasonably normal his albumin was 3.7 liver function tests BUN/creatinine all normal 05/23/18; x-rays of both his feet from last week were negative for underlying pulmonary abnormality. The x-ray of his pelvis however showed mild irregularity in the left ischial which may represent some early osteomyelitis. The wound in the left ischial continues to get deeper clearly now exposed muscle. Each week necrotic surface material over this area. Whereas the rest of the wounds do not look so bad. The left ischial wound we have been using Santyl and calcium alginate T the left heel surface necrotic debris using Iodoflex o The left lateral leg is still  healed Areas on the left dorsal foot and the right dorsal foot are about the same. There is some inflammation on the left which might represent contact dermatitis, fungal dermatitis I am doubtful cellulitis although this looks better than last week 05/30/18; CT scan done at Hospital did not show any osteomyelitis or abscess. Suggested the possibility of underlying cellulitis although I don't see [name] lot of evidence of this at the bedside The wound itself on the left buttock/upper thigh actually looks somewhat better. No debridement Left heel also looks better no debridement continue Iodoflex Both dorsal first second toe spaces appear better using Lotrisone. Left still required debridement 06/06/18; Intake reported some purulent looking drainage from the left gluteal wound. Using Santyl and calcium alginate Left heel looks better although still [name] nonviable surface requiring debridement The left dorsal foot first/second webspace actually expanding and somewhat deeper. I may consider doing [name] shave biopsy of this area Right dorsal foot first/second webspace appears stable to improved. Using Lotrisone and silver alginate to both these areas 06/13/18 Left gluteal surface looks  better. Now separated in the 2 wounds. No debridement required. Still drainage. We'll continue silver alginate Left heel continues to look better with Iodoflex continue this for at least another week Of his dorsal foot wounds the area on the left still has some depth although it looks better than last week. We've been using Lotrisone and silver alginate 06/20/18 Left gluteal continues to look better healthy tissue Left heel continues to look better healthy granulation wound is smaller. He is using Iodoflex and his long as this continues continue the Iodoflex Dorsal right foot looks better unfortunately dorsal left foot does not. There is swelling and erythema of his forefoot. He had minor trauma to this several days ago but doesn't  think this was enough to have caused any tissue injury. Foot looks like cellulitis, we have had this problem before 06/27/18 on evaluation today patient appears to be doing [name] little worse in regard to his foot ulcer. 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 Both of which would really be his only options for treating this infection. In the past he has been given and effusion of Orbactiv. This is done very well for him in the past again it's one time dosing IV antibiotic therapy. Subsequently I do believe this is something we're gonna need to see about doing at this point in time. Currently his other wounds seem to be doing somewhat better in my pinion I'm pretty happy in that regard. 07/03/18 on evaluation today patient's wounds actually appear to be doing fairly well. He has been tolerating the dressing changes without complication. All in all he seems to be showing signs of improvement. In regard to the antibiotics he has been dealing with infectious disease since I saw him last week as far as getting this scheduled. In the end he's going to be [facility] done this coming Friday. In the meantime he has been continuing to perform the dressing changes in such as previous. There does not appear to be any evidence of infection worsengin at this time. 07/10/18; Since I last saw this man 2 [name] ago things have actually improved. IV antibiotics of resulted in less forefoot erythema although there is still some present. He is not systemically unwell Left buttock wounds 2 now have no depth there is increased epithelialization Using silver alginate Left heel still requires debridement using Iodoflex Left dorsal foot still with [name] sizable wound about the size of [name] border but healthy granulation Right dorsal foot still with [name]  slitlike area using silver alginate 07/18/18; the patient's cellulitis in the left foot is improved in fact I think it is on its way to resolving. Left buttock wounds 2 both look better although the larger one has hypertension granulation we've been using silver alginate Left heel has some thick circumferential redundant skin over the wound edge which will need to be removed today we've been using Iodoflex Left dorsal foot is still [name] sizable wound required debridement using silver alginate The right dorsal foot is just about closed only [name] small open area remains here 07/25/18; left foot cellulitis is resolved Left buttock wounds 2 both look better. Hyper-granulation on the major area Left heel as some debris over the surface but otherwise looks [name] healthier wound. Using silver collagen Right dorsal foot is just about closed 07/31/18; arrives with  our intake nurse worried about purulent drainage from the buttock. We had hyper-granulation here last week His buttock wounds 2 continue to look better Left heel some debris over the surface but measuring smaller. Right dorsal foot unfortunately has openings between the toes Left foot superficial wound looks less aggravated. 08/07/18 Buttock wounds continue to look better although some of her granulation and the larger medial wound. silver alginate Left heel continues to look [name] lot better.silver collagen Left foot superficial wound looks less stable. Requires debridement. He has [name] new wound superficial area on the foot on the lateral dorsal foot. Right foot looks better using silver alginate without Lotrisone 08/14/2018; patient was in the ER last week diagnosed with [name] UTI. He is now on Cefpodoxime and Macrodantin. Buttock wounds continued to be smaller. Using silver alginate Left heel continues to look better using silver collagen Left foot superficial wound looks as though it is improving Right dorsal foot area is just about healed. 08/21/2018; patient is  completed his antibiotics for his UTI. He has 2 open areas on the buttocks. There is still not closed although the surface looks satisfactory. Using silver alginate Left heel continues to improve using silver collagen The bilateral dorsal foot areas which are at the base of his first and second toes/possible tinea pedis are actually stable on the left but worse on the right. The area on the left required debridement of necrotic surface. After debridement I obtained [name] specimen for PCR culture. The right dorsal foot which is been just about healed last week is now reopened 08/28/2018; culture done on the left dorsal foot showed coag negative staph both staph epidermidis and Lugdunensis. I think this is worthwhile initiating systemic treatment. I will use doxycycline given his long list of allergies. The area on the left heel slightly improved but still requiring debridement. The large wound on the buttock is just about closed whereas the smaller one is larger. Using silver alginate in this area 09/04/2018; patient is completing his doxycycline for the left foot although this continues to be [name] very difficult wound area with very adherent necrotic debris. We are using silver alginate to all his wounds right foot left foot and the small wounds on his buttock, silver collagen on the left heel. 09/11/2018; once again this patient has intense erythema and swelling of the left forefoot. Lesser degrees of erythema in the right foot. He has [name] long list of allergies and intolerances. I will reinstitute doxycycline. 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 Using silver alginate Left heel also looks better using silver collagen Unfortunately both the areas on his feet look worse. The area on the left first second webspace is now gone through to the plantar part of his foot. The area on the left foot anteriorly is irritated with erythema and swelling in the  forefoot. 09/25/2018 His wound on the left plantar heel looks better. Using silver collagen The area on the left buttock 2 small remnant areas. One is closed one is still open. Using silver alginate The areas between both his first and second toes look worse. This in spite of long-standing antifungal therapy with ketoconazole and silver alginate which should have antifungal activity He has small areas around his original wound on the left calf one is on the bottom of the original scar tissue and one superiorly both of these are small and superficial but again given wound history in this site this is  worrisome 10/02/2018 Left plantar heel continues to gradually contract using silver collagen Left buttock wound is unchanged using silver alginate The areas on his dorsal feet between his first and second toes bilaterally look about the same. I prescribed clindamycin ointment to see if we can address chronic staph colonization and also the underlying possibility of erythrasma The left lateral lower extremity wound is actually on the lateral part of his ankle. Small open area here. We have been using silver alginate 10/09/2018; Left plantar heel continues to look healthy and contract. No debridement is required Left buttock slightly smaller with [name] tape injury wound just below which was new this week Dorsal feet somewhat improved I have been using clindamycin Left lateral looks lower extremity the actual open area looks worse although [name] lot of this is epithelialized. I am going to change to silver collagen today He has [name] lot more swelling in the right leg although this is not pitting not red and not particularly warm there is [name] lot of spasm in the right leg usually indicative of people with paralysis of some underlying discomfort. We have reviewed his vascular status from 2017 he had [name] left greater saphenous vein ablation. I wonder about referring him back to vascular surgery if the area on the left  leg continues to deteriorate. 10/16/2018 in today for follow-up and management of multiple lower extremity ulcers. His left Buttock wound is much lower smaller and almost closed completely. The wound to the left ankle has began to reopen with Epithelialization and some adherent slough. He has multiple new areas to the left foot and leg. The left dorsal foot without much improvement. Wound present between left great webspace and 2nd toe. Erythema and edema present right leg. Right LE ultrasound obtained on 10/10/18 was negative for DVT . 10/23/2018; Left buttock is closed over. Still dry macerated skin but there is no open wound. I suspect this is chronic pressure/moisture Left lateral calf is quite [name] bit worse than when I saw this last. There is clearly drainage here he has macerated skin into the left plantar heel. We will change the primary dressing to alginate Left dorsal foot has some improvement in overall wound area. Still using clindamycin and silver alginate Right dorsal foot about the same as the left using clindamycin and silver alginate The erythema in the right leg has resolved. He is DVT rule out was negative Left heel pressure area required debridement although the wound is smaller and the surface is health 10/26/2018 The patient came back in for his nurse check today predominantly because of the drainage coming out of the left lateral leg with [name] recent reopening of his original wound on the left lateral calf. He comes in today with [name] large amount of surrounding erythema around the wound extending from the calf into the ankle and even in the area on the dorsal foot. He is not systemically unwell. He is not febrile. Nevertheless this looks like cellulitis. We have been using silver alginate to the area. I changed him to [name] regular visit and I am going to prescribe him doxycycline. The rationale here is [name] long list of medication intolerances and [name] history of MRSA. I did not see anything  that I thought would provide [name] valuable culture 10/30/2018 Follow-up from his appointment 4 days ago with really an extensive area of cellulitis in the left calf left lateral ankle and left dorsal foot. I put him on doxycycline. He has [name] long list of medication allergies  which are true allergy reactions. 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 In any case he is [name] lot better today. The erythema especially in the anterior and lateral left calf is better. He still has left ankle erythema. He also is complaining about increasing edema in the right leg we have only been using Kerlix Coban and he has been doing the wraps at home. Finally he has [name] spotty rash on the medial part of his upper left calf which looks like folliculitis or perhaps wrap occlusion type injury. Small superficial macules not pustules 11/06/18 patient arrives today with again [name] considerable degree of erythema around the wound on the left lateral calf extending into the dorsal ankle and dorsal foot. This is [name] lot worse than when I saw this last week. He is on doxycycline really with not [name] lot of improvement. He has not been systemically unwell Wounds on the; left heel actually looks improved. Original area on the left foot and proximity to the first and second toes looks about the same. He has superficial areas on the dorsal foot, anterior calf and then the reopening of his original wound on the left lateral calf which looks about the same The only area he has on the right is the dorsal webspace first and second which is smaller. He has [name] large area of dry erythematous skin on the left buttock small open area here. 11/13/2018; the patient arrives in much better condition. The erythema around the wound on the left lateral calf is [name] lot better. 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 In any case this is [name] lot better. The area on the left heel is very small and just about resolved using silver collagen we have been using silver alginate to the areas on his dorsal feet 11/20/2018; his wounds include the left lateral calf, left heel, dorsal aspects of both feet just proximal to the first second webspace. He is stable to slightly improved. I did not think any changes to his dressings were going to be necessary 11/27/2018 he has [name] reopening on the left buttock which is surrounded by what looks like tinea or perhaps some other form of dermatitis. The area on the left dorsal foot has some erythema around it I have marked this area but I am not sure whether this is cellulitis or not. Left heel is not closed. Left calf the reopening is really slightly longer and probably worse 1/13; in general things look better and smaller except for the left dorsal foot. Area on the left heel is just about closed, left buttock looks better only [name] small wound remains in the skin looks better [using Lotrisone] 1/20; the area on the left heel only has [name] few remaining open areas here. Left lateral calf about the same in terms of size, left dorsal foot slightly larger right lateral foot still not closed. The area on the left buttock has no open wound and the surrounding skin looks [name] lot better 1/27; the area on the left heel is closed. Left lateral calf better but still requiring extensive debridements. The area on his left buttock is closed. He still has the open areas on the left dorsal foot which is slightly smaller in the right foot which is slightly expanded. We have been using Iodoflex on these areas as well 2/3; left heel is  closed. Left lateral calf still requiring debridement using Iodoflex there is no open area on his left buttock however he has dry scaly skin over [name] large area of this. Not really responding well to the Lotrisone. Finally the areas on his dorsal feet at the level of the  first second webspace are slightly smaller on the right and about the same on the left. Both of these vigorously debrided with Anasept and gauze 2/10; left heel remains closed he has dry erythematous skin over the left buttock but there is no open wound here. Left lateral leg has come in and with. Still requiring debridement we have been using Iodoflex here. Finally the area on the left dorsal foot and right dorsal foot are really about the same extremely dry callused fissured areas. He does not yet have [name] dermatology appointment 2/17; left heel remains closed. He has [name] new open area on the left buttock. The area on the left lateral calf is bigger longer and still covered in necrotic debris. No major change in his foot areas bilaterally. I am awaiting for [name] dermatologist to look on this. We have been using ketoconazole I do not know that this is been doing any good at all. 2/24; left heel remains closed. The left buttock wound that was new reopening last week looks better. The left lateral calf appears better also although still requires debridement. The major area on his foot is the left first second also requiring debridement. We have been putting Prisma on all wounds. I do not believe that the ketoconazole has done too much good for his feet. He will use Lotrisone I am going to give him [name] 2-week course of terbinafine. We still do not have [name] dermatology appointment 3/2 left heel remains closed however there is skin over bone in this area I pointed this out to him today. The left buttock wound is epithelialized but still does not look completely stable. The area on the left leg required debridement were using silver collagen here. With regards to his feet we changed to Lotrisone last week and silver alginate. 3/9; left heel remains closed. Left buttock remains closed. The area on the right foot is essentially closed. The left foot remains unchanged. Slightly smaller on the left lateral calf. Using  silver collagen to both of these areas 3/16-Left heel remains closed. Area on right foot is closed. Left lateral calf above the lateral malleolus open wound requiring debridement with easy bleeding. Left dorsal wound proximal to first toe also debrided. Left ischial area open new. Patient has been using Prisma with wrapping every 3 days. Dermatology appointment is apparently tomorrow.Patient has completed his terbinafine 2-week course with some apparent improvement according to him, there is still flaking and dry skin in his foot on the left 3/23; area on the right foot is reopened. The area on the left anterior foot is about the same still [name] very necrotic adherent surface. He still has the area on the left leg and reopening is on the left buttock. He apparently saw dermatology although I do not have [name] note. According to the patient who is usually fairly well informed they did not have any good ideas. Put him on oral terbinafine which she is been on before. 3/30; using silver collagen to all wounds. Apparently his dermatologist put him on doxycycline and rifampin presumably some culture grew staph. I do not have this result. He remains on terbinafine although I have used terbinafine on him before 4/6; patient has had  [name] fairly substantial reopening on the right foot between the first and second toes. He is finished his terbinafine and I believe is on doxycycline and rifampin still as prescribed by dermatology. We have been using silver collagen to all his wounds although the patient reports that he thinks silver alginate does better on the wounds on his buttock. 4/13; the area on his left lateral calf about the same size but it did not require debridement. Left dorsal foot just proximal to the webspace between the first and second toes is about the same. Still nonviable surface. I note some superficial bronze discoloration of the dorsal part of his foot Right dorsal foot just proximal to the first  and second toes also looks about the same. I still think there may be the same discoloration I noted above on the left Left buttock wound looks about the same 4/20; left lateral calf appears to be gradually contracting using silver collagen. 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 The left dorsal foot wound is debrided of tightly adherent necrotic debris and really cleans up quite nicely. The right area is worse with expansion. I did not debride this it is now over the base of the second toe The area on his left buttock is smaller no debridement is required using silver collagen 5/4; left calf continues to make good progress. He arrives with erythema around the wounds on his dorsal foot which even extends to the plantar aspect. Very concerning for coexistent infection. He is finished the erythromycin I gave him for possible erythrasma this does not seem to have helped. The area on the left foot is about the same base of the dorsal toes Is area on the buttock looks improved on the left 5/11; left calf and left buttock continued to make good progress. Left foot is about the same to slightly improved. Major problem is on the right foot. He has not had an x-ray. Deep tissue culture I did last week showed both Enterobacter and E. coli. I did not change the doxycycline I put him on empirically although neither 1 of these were plated to doxycycline. He arrives today with the erythema looking worse on both the dorsal and plantar foot. Macerated skin on the bottom of the foot. he has not been systemically unwell 5/18-Patient returns at 1 week, left calf wound appears to be making some progress, left buttock wound appears slightly worse than last time, left foot wound looks slightly better, right foot redness is marginally better. X-ray of both feet show no air or evidence of osteomyelitis. Patient is finished his Omnicef and terbinafine. He  continues to have macerated skin on the bottom of the left foot as well as right 5/26; left calf wound is better, left buttock wound appears to have multiple small superficial open areas with surrounding macerated skin. X-rays that I did last time showed no evidence of osteomyelitis in either foot. He is finished cefdinir and doxycycline. I do not think that he was on terbinafine. He continues to have [name] large superficial open area on the right foot anterior dorsal and slightly between the first and second toes. I did send him to dermatology 2 months ago or so wondering about whether they would do [name] fungal scraping. I do not believe they did but did do [name] culture. We have been using silver alginate to the toe areas, he has been using antifungals at home topically either ketoconazole or Lotrisone. We are using silver  collagen on the left foot, silver alginate on the right, silver collagen on the left lateral leg and silver alginate on the left buttock 6/1; left buttock area is healed. We have the left dorsal foot, left lateral leg and right dorsal foot. We are using silver alginate to the areas on both feet and silver collagen to the area on his left lateral calf 6/8; the left buttock apparently reopened late last week. He is not really sure how this happened. He is tolerating the terbinafine. Using silver alginate to all wounds 6/15; left buttock wound is larger than last week but still superficial. Came in the clinic today with [name] report of purulence from the left lateral leg I did not identify any infection Both areas on his dorsal feet appear to be better. He is tolerating the terbinafine. Using silver alginate to all wounds 6/22; left buttock is about the same this week, left calf quite [name] bit better. His left foot is about the same however he comes in with erythema and warmth in the right forefoot once again. Culture that I gave him in the beginning of May showed Enterobacter and E. coli. I gave him  doxycycline and things seem to improve although neither 1 of these organisms was specifically plated. 6/29; left buttock is larger and dry this week. Left lateral calf looks to me to be improved. Left dorsal foot also somewhat improved right foot completely unchanged. The erythema on the right foot is still present. He is completing the Ceftin dinner that I gave him empirically [see discussion above.) 7/6 - All wounds look to be stable and perhaps improved, the left buttock wound is slightly smaller, per patient bleeds easily, completed ceftin, the right foot redness is less, he is on terbinafine 7/13; left buttock wound about the same perhaps slightly narrower. Area on the left lateral leg continues to narrow. Left dorsal foot slightly smaller right foot about the same. We are using silver alginate on the right foot and Hydrofera Blue to the areas on the left. Unna boot on the left 2 layer compression on the right 7/20; left buttock wound absolutely the same. Area on lateral leg continues to get better. Left dorsal foot require debridement as did the right no major change in the 7/27; left buttock wound the same size necrotic debris over the surface. The area on the lateral leg is closed once again. His left foot looks better right foot about the same although there is some involvement now of the posterior first second toe area. He is still on terbinafine which I have given him for [name] month, not certain [name] centimeter major change 06/25/19-All wounds appear to be slightly improved according to report, left buttock wound looks clean, both foot wounds have minimal to no debris the right dorsal foot has minimal slough. We are using Hydrofera Blue to the left and silver alginate to the right foot and ischial wound. 8/10-Wounds all appear to be around the same, the right forefoot distal part has some redness which was not there before, however the wound looks clean and small. Ischial wound looks about the  same with no changes 8/17; his wound on the left lateral calf which was his original chronic venous insufficiency wound remains closed. Since I last saw him the areas on the left dorsal foot right dorsal foot generally appear better but require debridement. The area on his left initial tuberosity appears somewhat larger to me perhaps hyper granulated and bleeds very easily. We have been  using Hydrofera Blue to the left dorsal foot and silver alginate to everything else 8/24; left lateral calf remains closed. The areas on his dorsal feet on the webspace of the first and second toes bilaterally both look better. The area on the left buttock which is the pressure ulcer stage II slightly smaller. I change the dressing to Hydrofera Blue to all areas 8/31; left lateral calf remains closed. The area on his dorsal feet bilaterally look better. Using Hydrofera Blue. Still requiring debridement on the left foot. No change in the left buttock pressure ulcers however 9/14; left lateral calf remains closed. Dorsal feet look quite [name] bit better than 2 [name] ago. Flaking dry skin also [name] lot better with the ammonium lactate I gave him 2 [name] ago. The area on the left buttock is improved. He states that his Roho cushion developed [name] leak and he is getting [name] new one, in the interim he is offloading this vigorously 9/21; left calf remains closed. Left heel which was [name] possible DTI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 In spite of that his leg actually looks somewhat better. There is still copious drainage and some erythema but it is  generally better. The oral options that were obvious including Zyvox and sulfonamides he has rash issues both of these. This is sensitive to rifampin but this is not usually used along gentamicin but this is parenteral and again not used along. The obvious alternative is vancomycin. He has had his arterial studies. [name] T brachial index was 1.3 oe on the right. His waveforms were biphasic bilaterally. Doppler waveforms of the digit were normal in the right damp and on the left. Comment that this could've been due to extreme edema. His venous studies show reflux on both sides in the femoral popliteal veins as well as the greater and lesser saphenous veins bilaterally. Ultimately he is going to need to see vascular surgery about this issue. Hopefully when we can get his wounds and [name] little better shape. 02/19/16; the patient was able to complete [name] course of [facility] for MRSA in the face of multiple antibiotic allergies. Arterial studies showed an ABI of him 0.88 on the right 1.17 on the left the. Waveforms were biphasic at the posterior tibial and dorsalis pedis digital waveforms were normal. Right toe brachial index was 1.3 limited by shaking and edema. His venous study showed widespread reflux in the left at the common femoral vein the greater and lesser saphenous vein the greater and lesser saphenous vein on the right as well as the popliteal and femoral vein. The popliteal and  femoral vein on the left did not show reflux. His wounds on the right leg give healed on the left he is still using Santyl. 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 The erythema has not really resolved and I wonder if this is mostly venous inflammation rather than cellulitis. Still using Santyl. He is approved for Apligraf 03/04/16; there is less erythema around the wound. Both wounds require aggressive surgical debridement. Not yet ready for Apligraf 03/11/16; aggressive debridement again. Not ready for Apligraf 03/18/16 aggressive debridement again. 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 Has been to see vascular surgery he is being planned for [name] venous ablation 03/25/16; aggressive debridement again of both wound areas on the left lateral leg. He is due for ablation surgery on May 22. He is much closer to being ready for an Apligraf. Has [name] new area between the left first and second toes 04/01/16 aggressive debridement done of both wounds. The new wound at the base of between his second and first toes looks stable 04/08/16; continued aggressive debridement of both wounds on the left lower leg. He goes for his venous ablation on Monday. The new wound at the base of his first and second toes dorsally appears stable. 04/15/16; wounds aggressively debridement although the base of this looks considerably better Apligraf #1. He had ablation surgery on Monday I'll need to research these records. We only have approval for four Apligraf's 04/22/16; the patient is here for [name] wound check [Apligraf last week] intake nurse concerned about erythema around the wounds. Apparently [name] significant degree of drainage. The patient has chronic venous inflammation which I think accounts for most of this however I was asked to look at this today 04/26/16; the patient came back for check of possible cellulitis in his left foot however the Apligraf dressing was  inadvertently removed therefore we elected to prep the wound for [name] second Apligraf. I put him on doxycycline on 6/1 the erythema in the foot 05/03/16 we did  not remove the dressing from the superior wound as this is where I put all of his last Apligraf. Surface debridement done with [name] curette of the lower wound which looks very healthy. The area on the left foot also looks quite satisfactory at the dorsal artery at the first and second toes 05/10/16; continue Apligraf to this. Her wound, Hydrafera to the lower wound. He has [name] new area on the right second toe. Left dorsal foot firstoosecond toe also looks improved 05/24/16; wound dimensions must be smaller I was able to use Apligraf to all 3 remaining wound areas. 06/07/16 patient's last Apligraf was 2 [name] ago. He arrives today with the 2 wounds on his lateral left leg joined together. This would have to be seen as [name] negative. He also has [name] small wound in his first and second toe on the left dorsally with quite [name] bit of surrounding erythema in the first second and third toes. This looks to be infected or inflamed, very difficult clinical call. 06/21/16: lateral left leg combined wounds. Adherent surface slough area on the left dorsal foot at roughly the fourth toe looks improved 07/12/16; he now has [name] single linear wound on the lateral left leg. This does not look to be [name] lot changed from when I lost saw this. The area on his dorsal left foot looks considerably better however. 08/02/16; no major change in the substantial area on his left lateral leg since last time. We have been using Hydrofera Blue for [name] prolonged period of time now. The area on his left foot is also unchanged from last review 07/19/16; the area on his dorsal foot on the left looks considerably smaller. He is beginning to have significant rims of epithelialization on the lateral left leg wound. This also looks better. 08/05/16; the patient came in for [name] nurse visit today. Apparently the  area on his left lateral leg looks better and it was wrapped. However in general discussion the patient noted [name] new area on the dorsal aspect of his right second toe. The exact etiology of this is unclear but likely relates to pressure. 08/09/16 really the area on the left lateral leg did not really look that healthy today perhaps slightly larger and measurements. The area on his dorsal right second toe is improved also the left foot wound looks stable to improved 08/16/16; the area on the last lateral leg did not change any of dimensions. Post debridement with [name] curet the area looked better. Left foot wound improved and the area on the dorsal right second toe is improved 08/23/16; the area on the left lateral leg may be slightly smaller both in terms of length and width. Aggressive debridement with [name] curette afterwards the tissue appears healthier. Left foot wound appears improved in the area on the dorsal right second toe is improved 08/30/16 patient developed [name] fever over the weekend and was seen in an urgent care. Felt to have [name] UTI and put on doxycycline. He has been since changed over the phone to [facility]. After we took off the wrap on his right leg today the leg is swollen warm and erythematous, probably more likely the source of the fever 09/06/16; have been using collagen to the major left leg wound, silver alginate to the area on his anterior foot/toes 09/13/16; the areas on his anterior foot/toes on both sides appear to be virtually closed. Extensive wound on the left lateral leg perhaps slightly narrower but each visit still covered an adherent surface slough  09/16/16 patient was in for his usual Thursday nurse visit however the intake nurse noted significant erythema of his dorsal right foot. He is also running [name] low- grade fever and having increasing spasms in the right leg 09/20/16 here for cellulitis involving his right great toes and forefoot. This is [name] lot better. Still requiring  debridement on his left lateral leg. Santyl direct says he needs prior authorization. Therefore his wife cannot change this at home 09/30/16; the patient's extensive area on the left lateral calf and ankle perhaps somewhat better. Using Santyl. The area on the left toes is healed and I think the area on his right dorsal foot is healed as well. There is no cellulitis or venous inflammation involving the right leg. He is going to need compression stockings here. 10/07/16; the patient's extensive wound on the left lateral calf and ankle does not measure any differently however there appears to be less adherent surface slough using Santyl and aggressive weekly debridements 10/21/16; no major change in the area on the left lateral calf. Still the same measurement still very difficult to debridement adherent slough and nonviable subcutaneous tissue. This is not really been helped by several [name] of Santyl. Previously for 2 [name] I used Iodoflex for [name] short period. [name] prolonged course of Hydrofera Blue didn't really help. I'm not sure why I only used 2 [name] of Iodoflex on this there is no evidence of surrounding infection. He has [name] small area on the right second toe which looks as though it's progressing towards closure 10/28/16; the wounds on his toes appear to be closed. No major change in the left lateral leg wound although the surface looks somewhat better using Iodoflex. He has had previous arterial studies that were normal. He has had reflux studies and is status post ablation although I don't have any exact notes on which vein was ablated. I'll need to check the surgical record 11/04/16; he's had [name] reopening between the first and second toe on the left and right. No major change in the left lateral leg wound. There is what appears to be cellulitis of the left dorsal foot 11/18/16 the patient was hospitalized initially in [place] and then subsequently transferred to [facility] long and was admitted there  from 11/09/16 through 11/12/16. He had developed progressive cellulitis on the right leg in spite of the doxycycline I gave him. I'd spoken to the hospitalist in [place] who was concerned about continuing leukocytosis. CT scan is what I suggested this was done which showed soft tissue swelling without evidence of osteomyelitis or an underlying abscess blood cultures were negative. At [facility] he was treated with vancomycin and Primaxin and then add an infectious disease consult. He was transitioned to Ceftaroline. He has been making progressive improvement. Overall [name] severe cellulitis of the right leg. He is been using silver alginate to her original wound on the left leg. The wounds in his toes on the right are closed there is [name] small open area on the base of the left second toe 11/26/15; the patient's right leg is much better although there is still some edema here this could be reminiscent from his severe cellulitis likely on top of some degree of lymphedema. His left anterior leg wound has less surface slough as reported by her intake nurse. Small wound at the base of the left second toe 12/02/16; patient's right leg is better and there is no open wound here. His left anterior lateral leg wound continues to have [name] healthy-looking  surface. Small wound at the base of the left second toe however there is erythema in the left forefoot which is worrisome 12/16/16; is no open wounds on his right leg. We took measurements for stockings. His left anterior lateral leg wound continues to have [name] healthy-looking surface. I'm not sure where we were with the Apligraf run through his insurance. We have been using Iodoflex. He has [name] thick eschar on the left first second toe interface, I suspect this may be fungal however there is no visible open 12/23/16; no open wound on his right leg. He has 2 small areas left of the linear wound that was remaining last week. We have been using Prisma, I thought I have disclosed  this week, we can only look forward to next week 01/03/17; the patient had concerning areas of erythema last week, already on doxycycline for UTI through his primary doctor. The erythema is absolutely no better there is warmth and swelling both medially from the left lateral leg wound and also the dorsal left foot. 01/06/17- Patient is here for follow-up evaluation of his left lateral leg ulcer and bilateral feet ulcers. He is on oral antibiotic therapy, tolerating that. Nursing staff and the patient states that the erythema is improved from Monday. 01/13/17; the predominant left lateral leg wound continues to be problematic. I had put Apligraf on him earlier this month once. However he subsequently developed what appeared to be an intense cellulitis around the left lateral leg wound. I gave him Dalvance I think on 2/12 perhaps 2/13 he continues on cefdinir. The erythema is still present but the warmth and swelling is improved. I am hopeful that the cellulitis part of this control. I wouldn't be surprised if there is an element of venous inflammation as well. 01/17/17. The erythema is present but better in the left leg. His left lateral leg wound still does not have [name] viable surface buttons certain parts of this long thin wound it appears like there has been improvement in dimensions. 01/20/17; the erythema still present but much better in the left leg. I'm thinking this is his usual degree of chronic venous inflammation. The wound on the left leg looks somewhat better. Is less surface slough 01/27/17; erythema is back to the chronic venous inflammation. The wound on the left leg is somewhat better. I am back to the point where I like to try an Apligraf once again 02/10/17; slight improvement in wound dimensions. Apligraf #2. He is completing his doxycycline 02/14/17; patient arrives today having completed doxycycline last Thursday. This was supposed to be [name] nurse visit however once again he hasn't  tense erythema from the medial part of his wound extending over the lower leg. Also erythema in his foot this is roughly in the same distribution as last time. He has baseline chronic venous inflammation however this is [name] lot worse than the baseline I have learned to accept the on him is baseline inflammation 02/24/17- patient is here for follow-up evaluation. He is tolerating compression therapy. His voicing no complaints or concerns he is here anticipating an Apligraf 03/03/17; he arrives today with an adherent necrotic surface. I don't think this is surface is going to be amenable for Apligraf's. The erythema around his wound and on the left dorsal foot has resolved he is off antibiotics 03/10/17; better-looking surface today. I don't think he can tolerate Apligraf's. He tells me he had [name] wound VAC after [name] skin graft years ago to this area and they had difficulty with  [name] seal. The erythema continues to be stable around this some degree of chronic venous inflammation but he also has recurrent cellulitis. We have been using Iodoflex 03/17/17; continued improvement in the surface and may be small changes in dimensions. Using Iodoflex which seems the only thing that will control his surface 03/24/17- He is here for follow up evaluation of his LLE lateral ulceration and ulcer to right dorsal foot/toe space. He is voicing no complaints or concerns, He is tolerating compression wrap. 03/31/17 arrives today with [name] much healthier looking wound on the left lower extremity. We have been using Iodoflex for [name] prolonged period of time which has for the first time prepared and adequate looking wound bed although we have not had much in the way of wound dimension improvement. He also has [name] small wound between the first and second toe on the right 04/07/17; arrives today with [name] healthy-looking wound bed and at least the top 50% of this wound appears to be now her. No debridement was required I have changed him to [facility] last week after prolonged Iodoflex. He did not do well with Apligraf's. We've had [name] re-opening between the first and second toe on the right 04/14/17; arrives today with [name] healthier looking wound bed contractions and the top 50% of this wound and some on the lesser 50%. Wound bed appears healthy. The area between the first and second toe on the right still remains problematic 04/21/17; continued very gradual improvement. Using [facility] 04/28/17; continued very gradual improvement in the left lateral leg venous insufficiency wound. His periwound erythema is very mild. We have been using Hydrofera Blue. Wound is making progress especially in the superior 50% 05/05/17; he continues to have very gradual improvement in the left lateral venous insufficiency wound. Both in terms with an length rings are improving. I debrided this every 2 [name] with #5 curet and we have been using Hydrofera Blue and again making good progress With regards to the wounds between his right first and second toe which I thought might of been tinea pedis he is not making as much progress very dry scaly skin over the area. Also the area at the base of the left first and second toe in [name] similar condition 05/12/17; continued gradual improvement in the refractory left lateral venous insufficiency wound on the left. Dimension smaller. Surface still requiring debridement using Hydrofera Blue 05/19/17; continued gradual improvement in the refractory left lateral venous ulceration. Careful inspection of the wound bed underlying rumination suggested some degree of epithelialization over the surface no debridement indicated. Continue Hydrofera Blue difficult areas between his toes first and third on the left than first and second on the right. I'm going to change to silver alginate from silver collagen. Continue ketoconazole as I suspect underlying tinea pedis 05/26/17; left lateral leg venous insufficiency wound. We've been using  Hydrofera Blue. I believe that there is expanding epithelialization over the surface of the wound albeit not coming from the wound circumference. This is [name] bit of an odd situation in which the epithelialization seems to be coming from the surface of the wound rather than in the exact circumference. There is still small open areas mostly along the lateral margin of the wound. ooHe has unchanged areas between the left first and second and the right first second toes which I been treating for tenia pedis 06/02/17; left lateral leg venous insufficiency wound. We have been using Hydrofera Blue. Somewhat smaller from the wound circumference. The surface of  the wound remains [name] bit on it almost epithelialized sedation in appearance. I use an open curette today debridement in the surface of all of this especially the edges ooSmall open wounds remaining on the dorsal right first and second toe interspace and the plantar left first second toe and her face on the left 06/09/17; wound on the left lateral leg continues to be smaller but very gradual and very dry surface using Hydrofera Blue 06/16/17 requires weekly debridements now on the left lateral leg although this continues to contract. I changed to silver collagen last week because of dryness of the wound bed. Using Iodoflex to the areas on his first and second toes/web space bilaterally 06/24/17; patient with history of paraplegia also chronic venous insufficiency with lymphedema. Has [name] very difficult wound on the left lateral leg. This has been gradually reducing in terms of with but comes in with [name] very dry adherent surface. High switch to silver collagen [name] week or so ago with hydrogel to keep the area moist. This is been refractory to multiple dressing attempts. He also has areas in his first and second toes bilaterally in the anterior and posterior web space. I had been using Iodoflex here after [name] prolonged course of silver alginate with ketoconazole was  ineffective [question tinea pedis] 07/14/17; patient arrives today with [name] very difficult adherent material over his left lateral lower leg wound. He also has surrounding erythema and poorly controlled edema. He was switched his Santyl last visit which the nurses are applying once during his doctor visit and once on [name] nurse visit. He was also reduced to 2 layer compression I'm not exactly sure of the issue here. 07/21/17; better surface today after 1 week of Iodoflex. Significant cellulitis that we treated last week also better. [Doxycycline] 07/28/17 better surface today with now 2 [name] of Iodoflex. Significant cellulitis treated with doxycycline. He has now completed the doxycycline and he is back to his usual degree of chronic venous inflammation/stasis dermatitis. He reminds me he has had ablations surgery here 08/04/17; continued improvement with Iodoflex to the left lateral leg wound in terms of the surface of the wound although the dimensions are better. 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 Problematic areas on the plantar aspect of the first second toe web space on the left and the dorsal aspect of the first second toe web space on the right. At one point I felt these were probably related to chronic fungal infections in treated him aggressively for this although we have not made any improvement here. 08/11/17; left lateral leg. Surface continues to improve with the Iodoflex although we are not seeing much improvement in overall wound dimensions. Areas on his plantar left foot and right foot show no improvement. In fact the right foot looks somewhat worse 08/18/17; left lateral leg. We changed to [facility] Blue last week after [name] prolonged course of Iodoflex which helps get the surface better. It appears that the wound with is improved. Continue with difficult areas on the left dorsal first second and plantar first second on  the right 09/01/17; patient arrives in clinic today having had [name] temperature of 103 yesterday. He was seen in the ER and [facility]. The patient was concerned he could have cellulitis again in the right leg however they diagnosed him with [name] UTI and he is now on Keflex. He has [name] history of cellulitis which is been recurrent and difficult but this is  been in the left leg, in the past 5 use doxycycline. He does in and out catheterizations at home which are risk factors for UTI 09/08/17; patient will be completing his Keflex this weekend. The erythema on the left leg is considerably better. He has [name] new wound today on the medial part of the right leg small superficial almost looks like [name] skin tear. He has worsening of the area on the right dorsal first and second toe. His major area on the left lateral leg is better. Using Hydrofera Blue on all areas 09/15/17; gradual reduction in width on the long wound in the left lateral leg. No debridement required. He also has wounds on the plantar aspect of his left first second toe web space and on the dorsal aspect of the right first second toe web space. 09/22/17; there continues to be very gradual improvements in the dimensions of the left lateral leg wound. He hasn't round erythematous spot with might be pressure on his wheelchair. There is no evidence obviously of infection no purulence no warmth ooHe has [name] dry scaled area on the plantar aspect of the left first second toe ooImproved area on the dorsal right first second toe. 09/29/17; left lateral leg wound continues to improve in dimensions mostly with an is still [name] fairly long but increasingly narrow wound. ooHe has [name] dry scaled area on the plantar aspect of his left first second toe web space ooIncreasingly concerning area on the dorsal right first second toe. In fact I am concerned today about possible cellulitis around this wound. The areas extending up his second toe and although there is deformities  here almost appears to abut on the nailbed. 10/06/17; left lateral leg wound continues to make very gradual progress. Tissue culture I did from the right first second toe dorsal foot last time grew MRSA and enterococcus which was vancomycin sensitive. This was not sensitive to clindamycin or doxycycline. He is allergic to Zyvox and sulfa we have therefore arrange for him to have dalvance infusion tomorrow. He is had this in the past and tolerated it well 10/20/17; left lateral leg wound continues to make decent progress. This is certainly reduced in terms of with there is advancing epithelialization.ooThe cellulitis in the right foot looks better although he still has [name] deep wound in the dorsal aspect of the first second toe web space. Plantar left first toe web space on the left I think is making some progress 10/27/17; left lateral leg wound continues to make decent progress. Advancing epithelialization.using Hydrofera Blue ooThe right first second toe web space wound is better-looking using silver alginate ooImprovement in the left plantar first second toe web space. Again using silver alginate 11/03/17 left lateral leg wound continues to make decent progress albeit slowly. Using Hydrofera Blue ooThe right per second toe web space continues to be [name] very problematic looking punched out wound. I obtained [name] piece of tissue for deep culture I did extensively treated this for fungus. It is difficult to imagine that this is [name] pressure area as the patient states other than going outside he doesn't really wear shoes at home ooThe left plantar first second toe web space looked fairly senescent. Necrotic edges. This required debridement oochange to Hydrofera Blue to all wound areas 11/10/17; left lateral leg wound continues to contract. Using Hydrofera Blue ooOn the right dorsal first second toe web space dorsally. Culture I did of this area last week grew MRSA there is not an easy oral option in  this patient was multiple antibiotic allergies or intolerances. This was only [name] rare culture isolate I'm therefore going to use Bactroban under silver alginate ooOn the left plantar first second toe web space. Debridement is required here. This is also unchanged 11/17/17; left lateral leg wound continues to contract using Hydrofera Blue this is no longer the major issue. ooThe major concern here is the right first second toe web space. He now has an open area going from dorsally to the plantar aspect. There is now wound on the inner lateral part of the first toe. Not [name] very viable surface on this. There is erythema spreading medially into the forefoot. ooNo major change in the left first second toe plantar wound 11/24/17; left lateral leg wound continues to contract using Hydrofera Blue. Nice improvement today ooThe right first second toe web space all of this looks [name] lot less angry than last week. I have given him clindamycin and topical Bactroban for MRSA and terbinafine for the possibility of underlining tinea pedis that I could not control with ketoconazole. Looks somewhat better ooThe area on the plantar left first second toe web space is weeping with dried debris around the wound 12/01/17; left lateral leg wound continues to contract he Hydrofera Blue. It is becoming thinner in terms of with nevertheless it is making good improvement. ooThe right first second toe web space looks less angry but still [name] large necrotic-looking wounds starting on the plantar aspect of the right foot extending between the toes and now extensively on the base of the right second toe. I gave him clindamycin and topical Bactroban for MRSA anterior benefiting for the possibility of underlying tinea pedis. Not looking better today ooThe area on the left first/second toe looks better. Debrided of necrotic debris 12/05/17* the patient was worked in urgently today because over the weekend he found blood on his  incontinence bad when he woke up. He was found to have an ulcer by his wife who does most of his wound care. He came in today for [place] to look at this. He has not had [name] history of wounds in his buttocks in spite of his paraplegia. 12/08/17; seen in follow-up today at his usual appointment. He was seen earlier this week and found to have [name] new wound on his buttock. We also follow him for wounds on the left lateral leg, left first second toe web space and right first second toe web space 12/15/17; we have been using Hydrofera Blue to the left lateral leg which has improved. The right first second toe web space has also improved. Left first second toe web space plantar aspect looks stable. The left buttock has worsened using Santyl. Apparently the buttock has drainage 12/22/17; we have been using Hydrofera Blue to the left lateral leg which continues to improve now 2 small wounds separated by normal skin. He tells [place] he had [name] fever up to 100 yesterday he is prone to UTIs but has not noted anything different. He does in and out catheterizations. The area between the first and second toes today does not look good necrotic surface covered with what looks to be purulent drainage and erythema extending into the third toe. I had gotten this to something that I thought look better last time however it is not look good today. He also has [name] necrotic surface over the buttock wound which is expanded. I thought there might be infection under here so I removed [name] lot of the surface with [name] #5  curet though nothing look like it really needed culturing. He is been using Santyl to this area 12/27/17; his original wound on the left lateral leg continues to improve using Hydrofera Blue. I gave him samples of Baxdella although he was unable to take them out of fear for an allergic reaction ["lump in his throat"].the culture I did of the purulent drainage from his second toe last week showed both enterococcus and [name] set Enterobacter I  was also concerned about the erythema on the bottom of his foot although paradoxically although this looks somewhat better today. 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 We've been using silver alginate here. They came up today that he sleeps in [name] recliner, I'm not sure why but I asked him to stop this 01/03/18; his original wound we've been using Hydrofera Blue is now separated into 2 areas. ooUlcer on his left buttock is better he is off the recliner and sleeping in bed ooFinally both wound areas between his first and second toes also looks some better 01/10/18; his original wound on the left lateral leg is now separated into 2 wounds we've been using Hydrofera Blue ooUlcer on his left buttock has some drainage. There is [name] small probing site going into muscle layer superiorly.using silver alginate -He arrives today with [name] deep tissue injury on the left heel ooThe wound on the dorsal aspect of his first second toe on the left looks [name] lot betterusing silver alginate ketoconazole ooThe area on the first second toe web space on the right also looks [name] lot bette 01/17/18; his original wound on the left lateral leg continues to progress using Hydrofera Blue ooUlcer on his left buttock also is smaller surface healthier except for [name] small probing site going into the muscle layer superiorly. 2.4 cm of tunneling in this area ooDTI on his left heel we have only been offloading. Looks better than last week no threatened open no evidence of infection oothe wound on the dorsal aspect of the first second toe on the left continues to look like it's regressing we have only been using silver alginate and terbinafine orally ooThe area in the first second toe web space on the right also looks to be [name] lot better using silver alginate and terbinafine I think this was prompted by tinea pedis 01/31/18; the patient was  hospitalized in [place] last week apparently for [name] complicated UTI. He was discharged on cefepime he does in and out catheterizations. In the hospital he was discovered M I don't mild elevation of AST and ALT and the terbinafine was stopped.predictably the pressure ulcer on s his buttock looks betterusing silver alginate. The area on the left lateral leg also is better using Hydrofera Blue. The area between the first and second toes on the left better. First and second toes on the right still substantial but better. Finally the DTI on the left heel has held together and looks like it's resolving 02/07/18-he is here in follow-up evaluation for multiple ulcerations. He has new injury to the lateral aspect of the last issue [name] pressure ulcer, he states this is from adhesive removal trauma. He states he has tried multiple adhesive products with no success. All other ulcers appear stable. The left heel DTI is resolving. We will continue with same treatment plan and follow-up next week. 02/14/18; follow-up for multiple areas. ooHe has [name] new area last week on the lateral aspect  of his pressure ulcer more over the posterior trochanter. The original pressure ulcer looks quite stable has healthy granulation. 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 Almost fully epithelialized on the original superior area using Hydrofera Blue ooDTI on the left heel has peeled off this week to reveal [name] small superficial wound under denuded skin and subcutaneous tissue ooBoth areas between the first and second toes look better including nothing open on the left 02/21/18; ooThe patient's wounds on his left ischial tuberosity and posterior left greater trochanter actually looked better. He has [name] large area of irritation around the area which I think is contact dermatitis. I am doubtful that this is fungal ooHis original  wound on the left lateral calf continues to improve we have been using Hydrofera Blue ooThere is no open area in the left first second toe web space although there is [name] lot of thick callus ooThe DTI on the left heel required debridement today of necrotic surface eschar and subcutaneous tissue using silver alginate ooFinally the area on the right first second toe webspace continues to contract using silver alginate and ketoconazole 02/28/18 ooLeft ischial tuberosity wounds look better using silver alginate. ooOriginal wound on the left calf only has one small open area left using Hydrofera Blue ooDTI on the left heel required debridement mostly removing skin from around this wound surface. Using silver alginate ooThe areas on the right first/second toe web space using silver alginate and ketoconazole 03/08/18 on evaluation today patient appears to be doing decently well as best I can tell in regard to his wounds. This is the first time that I have seen him as he generally is followed by Dr. Dellia Nims. With that being said none of his wounds appear to be infected he does have an area where there is some skin covering what appears to be [name] new wound on the left dorsal surface of his great toe. This is right at the nail bed. With that being said I do believe that debrided away some of the excess skin can be of benefit in this regard. Otherwise he has been tolerating the dressing changes without complication. 03/14/18; patient arrives today with the multiplicity of wounds that we are following. He has not been systemically unwell ooOriginal wound on the left lateral calf now only has 2 small open areas we've been using Hydrofera Blue which should continue ooThe deep tissue injury on the left heel requires debridement today. We've been using silver alginate ooThe left first second toe and the right first second toe are both are reminiscence what I think was tinea pedis. Apparently some of the callus  Surface between the toes was removed last week when it started draining. ooPurulent drainage coming from the wound on the ischial tuberosity on the left. 03/21/18-He is here in follow-up evaluation for multiple wounds. 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 He tolerated debridement. The only change to last week's recommendations is to discontinue antifungal cream between toes. He will follow-up next week 03/28/18; following up for multiple wounds;Concern this week is streaking redness and swelling in the right foot. He is going to need antibiotics for this. 03/31/18; follow-up for right foot cellulitis. Streaking redness and swelling in the right foot on 03/28/18. 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 I put him on clindamycin 300 mg every 6 and brought  him in for [name] quick check. He has an open wound between his first and second toes on the right foot as [name] potential source. 04/04/18; ooRight foot cellulitis is resolving he is completing clindamycin. This is truly good news ooLeft lateral calf wound which is initial wound only has one small open area inferiorly this is close to healing out. He has compression stockings. We will use Hydrofera Blue right down to the epithelialization of this ooNonviable surface on the left heel which was initially pressure with [name] DTI. We've been using Hydrofera Blue. I'm going to switch this back to silver alginate ooLeft first second toe/tinea pedis this looks better using silver alginate ooRight first second toe tinea pedis using silver alginate ooLarge pressure ulcers on theLeft ischial tuberosity. Small wound here Looks better. I am uncertain about the surface over the large wound. Using silver alginate 04/11/18; ooCellulitis in the right foot is resolved ooLeft lateral calf wound which was his original wounds still has 2 tiny open areas remaining this is  just about closed ooNonviable surface on the left heel is better but still requires debridement ooLeft first second toe/tinea pedis still open using silver alginate ooRight first second toe wound tinea pedis I asked him to go back to using ketoconazole and silver alginate ooLarge pressure ulcers on the left ischial tuberosity this shear injury here is resolved. Wound is smaller. No evidence of infection using silver alginate 04/18/18; ooPatient arrives with an intense area of cellulitis in the right mid lower calf extending into the right heel area. Bright red and warm. Smaller area on the left anterior leg. He has [name] significant history of MRSA. He will definitely need antibioticsoodoxycycline ooHe now has 2 open areas on the left ischial tuberosity the original large wound and now [name] satellite area which I think was above his initial satellite areas. Not [name] wonderful surface on this satellite area surrounding erythema which looks like pressure related. ooHis left lateral calf wound again his original wound is just about closed ooLeft heel pressure injury still requiring debridement ooLeft first second toe looks [name] lot better using silver alginate ooRight first second toe also using silver alginate and ketoconazole cream also looks better 04/20/18; the patient was worked in early today out of concerns with his cellulitis on the right leg. I had started him on doxycycline. This was 2 days ago. His wife was concerned about the swelling in the area. Also concerned about the left buttock. He has not been systemically unwell no fever chills. No nausea vomiting or diarrhea 04/25/18; the patient's left buttock wound is continued to deteriorate he is using Hydrofera Blue. He is still completing clindamycin for the cellulitis on the right leg although all of this looks better. 05/02/18 ooLeft buttock wound still with [name] lot of drainage and [name] very tightly adherent fibrinous necrotic surface. He has [name]  deeper area superiorly ooThe left lateral calf wound is still closed ooDTI wound on the left heel necrotic surface especially the circumference using Iodoflex ooAreas between his left first second toe and right first second toe both look better. Dorsally and the right first second toe he had [name] necrotic surface although at smaller. In using silver alginate and ketoconazole. I did [name] culture last week which was [name] deep tissue culture of the reminiscence of the open wound on the right first second toe dorsally. This grew [name] few Acinetobacter and [name] few methicillin-resistant staph aureus. Nevertheless the area actually this week looked better. I didn't feel the  need to specifically address this at least in terms of systemic antibiotics. 05/09/18; wounds are measuring larger more drainage per our intake. We are using Santyl covered with alginate on the large superficial buttock wounds, Iodosorb on the left heel, ketoconazole and silver alginate to the dorsal first and second toes bilaterally. 05/16/18; ooThe area on his left buttock better in some aspects although the area superiorly over the ischial tuberosity required an extensive debridement.using Santyl ooLeft heel appears stable. Using Iodoflex ooThe areas between his first and second toes are not bad however there is spreading erythema up the dorsal aspect of his left foot this looks like cellulitis again. He is insensate the erythema is really very brilliant.o Erysipelas He went to see an allergist days ago because he was itching part of this he had lab work done. This showed [name] white count of 15.1 with 70% neutrophils. Hemoglobin of 11.4 and [name] platelet count of 659,000. Last white count we had in Epic was [name] 2-1/2 years ago which was 25.9 but he was ill at the time. He was able to show me some lab work that was done by his primary physician the pattern is about the same. 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 But prompted me to go ahead and do x-rays of both feet and the pelvis rule out osteomyelitis. He also had [name] comprehensive metabolic panel this was reasonably normal his albumin was 3.7 liver function tests BUN/creatinine all normal 05/23/18; x-rays of both his feet from last week were negative for underlying pulmonary abnormality. The x-ray of his pelvis however showed mild irregularity in the left ischial which may represent some early osteomyelitis. The wound in the left ischial continues to get deeper clearly now exposed muscle. Each week necrotic surface material over this area. Whereas the rest of the wounds do not look so bad. ooThe left ischial wound we have been using Santyl and calcium alginate ooT the left heel surface necrotic debris using Iodoflex o ooThe left lateral leg is still healed ooAreas on the left dorsal foot and the right dorsal foot are about the same. There is some inflammation on the left which might represent contact dermatitis, fungal dermatitis I am doubtful cellulitis although this looks better than last week 05/30/18; CT scan done at Hospital did not show any osteomyelitis or abscess. Suggested the possibility of underlying cellulitis although I don't see [name] lot of evidence of this at the bedside ooThe wound itself on the left buttock/upper thigh actually looks somewhat better. No debridement ooLeft heel also looks better no debridement continue Iodoflex ooBoth dorsal first second toe spaces appear better using Lotrisone. Left still required debridement 06/06/18; ooIntake reported some purulent looking drainage from the left gluteal wound. Using Santyl and calcium alginate ooLeft heel looks better although still [name] nonviable surface requiring debridement ooThe left dorsal foot first/second webspace actually expanding and somewhat deeper. I may consider doing [name] shave biopsy of this area ooRight dorsal foot first/second webspace appears stable to  improved. Using Lotrisone and silver alginate to both these areas 06/13/18 ooLeft gluteal surface looks better. Now separated in the 2 wounds. No debridement required. Still drainage. We'll continue silver alginate ooLeft heel continues to look better with Iodoflex continue this for at least another week ooOf his dorsal foot wounds the area on the left still has some depth although it looks better than last week. We've been using Lotrisone and silver alginate 06/20/18 ooLeft gluteal  continues to look better healthy tissue ooLeft heel continues to look better healthy granulation wound is smaller. He is using Iodoflex and his long as this continues continue the Iodoflex ooDorsal right foot looks better unfortunately dorsal left foot does not. There is swelling and erythema of his forefoot. He had minor trauma to this several days ago but doesn't think this was enough to have caused any tissue injury. Foot looks like cellulitis, we have had this problem before 06/27/18 on evaluation today patient appears to be doing [name] little worse in regard to his foot ulcer. 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 Both of which would really be his only options for treating this infection. In the past he has been given and effusion of Orbactiv. This is done very well for him in the past again it's one time dosing IV antibiotic therapy. Subsequently I do believe this is something we're gonna need to see about doing at this point in time. Currently his other wounds seem to be doing somewhat better in my pinion I'm pretty happy in that regard. 07/03/18 on evaluation today patient's wounds actually appear to be doing fairly well. He has been tolerating the dressing changes without complication. All in all he seems to be showing signs of improvement. In regard to the antibiotics he  has been dealing with infectious disease since I saw him last week as far as getting this scheduled. In the end [facility]'s [facility] this coming Friday. In the meantime he has been continuing to perform the dressing changes in such as previous. There does not appear to be any evidence of infection worsengin at this time. 07/10/18; ooSince I last saw this man 2 [name] ago things have actually improved. IV antibiotics of resulted in less forefoot erythema although there is still some present. He is not systemically unwell ooLeft buttock wounds o2 now have no depth there is increased epithelialization Using silver alginate ooLeft heel still requires debridement using Iodoflex ooLeft dorsal foot still with [name] sizable wound about the size of [name] border but healthy granulation ooRight dorsal foot still with [name] slitlike area using silver alginate 07/18/18; the patient's cellulitis in the left foot is improved in fact I think it is on its way to resolving. ooLeft buttock wounds o2 both look better although the larger one has hypertension granulation we've been using silver alginate ooLeft heel has some thick circumferential redundant skin over the wound edge which will need to be removed today we've been using Iodoflex ooLeft dorsal foot is still [name] sizable wound required debridement using silver alginate ooThe right dorsal foot is just about closed only [name] small open area remains here 07/25/18; left foot cellulitis is resolved ooLeft buttock wounds o2 both look better. Hyper-granulation on the major area ooLeft heel as some debris over the surface but otherwise looks [name] healthier wound. Using silver collagen ooRight dorsal foot is just about closed 07/31/18; arrives with our intake nurse worried about purulent drainage from the buttock. We had hyper-granulation here last week ooHis buttock wounds o2 continue to look better ooLeft heel some  debris over the surface but measuring smaller. ooRight dorsal foot unfortunately has openings between the toes ooLeft foot superficial wound looks less aggravated. 08/07/18 ooButtock wounds continue to look better although some of her granulation  and the larger medial wound. silver alginate ooLeft heel continues to look [name] lot better.silver collagen ooLeft foot superficial wound looks less stable. Requires debridement. He has [name] new wound superficial area on the foot on the lateral dorsal foot. ooRight foot looks better using silver alginate without Lotrisone 08/14/2018; patient was in the ER last week diagnosed with [name] UTI. He is now on Cefpodoxime and Macrodantin. ooButtock wounds continued to be smaller. Using silver alginate ooLeft heel continues to look better using silver collagen ooLeft foot superficial wound looks as though it is improving ooRight dorsal foot area is just about healed. 08/21/2018; patient is completed his antibiotics for his UTI. ooHe has 2 open areas on the buttocks. There is still not closed although the surface looks satisfactory. Using silver alginate ooLeft heel continues to improve using silver collagen ooThe bilateral dorsal foot areas which are at the base of his first and second toes/possible tinea pedis are actually stable on the left but worse on the right. The area on the left required debridement of necrotic surface. After debridement I obtained [name] specimen for PCR culture. ooThe right dorsal foot which is been just about healed last week is now reopened 08/28/2018; culture done on the left dorsal foot showed coag negative staph both staph epidermidis and Lugdunensis. I think this is worthwhile initiating systemic treatment. I will use doxycycline given his long list of allergies. The area on the left heel slightly improved but still requiring debridement. ooThe large wound on the buttock is just about closed whereas the smaller one is larger. Using  silver alginate in this area 09/04/2018; patient is completing his doxycycline for the left foot although this continues to be [name] very difficult wound area with very adherent necrotic debris. We are using silver alginate to all his wounds right foot left foot and the small wounds on his buttock, silver collagen on the left heel. 09/11/2018; once again this patient has intense erythema and swelling of the left forefoot. Lesser degrees of erythema in the right foot. He has [name] long list of allergies and intolerances. I will reinstitute doxycycline. 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 Using silver alginate ooLeft heel also looks better using silver collagen ooUnfortunately both the areas on his feet look worse. The area on the left first second webspace is now gone through to the plantar part of his foot. The area on the left foot anteriorly is irritated with erythema and swelling in the forefoot. 09/25/2018 ooHis wound on the left plantar heel looks better. Using silver collagen ooThe area on the left buttock 2 small remnant areas. One is closed one is still open. Using silver alginate ooThe areas between both his first and second toes look worse. This in spite of long-standing antifungal therapy with ketoconazole and silver alginate which should have antifungal activity ooHe has small areas around his original wound on the left calf one is on the bottom of the original scar tissue and one superiorly both of these are small and superficial but again given wound history in this site this is worrisome 10/02/2018 ooLeft plantar heel continues to gradually contract using silver collagen ooLeft buttock wound is unchanged using silver alginate ooThe areas on his dorsal feet between his first and second toes bilaterally look about the same. I prescribed clindamycin ointment to see if we can address chronic staph colonization and also the  underlying possibility of erythrasma ooThe left lateral lower extremity  wound is actually on the lateral part of his ankle. Small open area here. We have been using silver alginate 10/09/2018; ooLeft plantar heel continues to look healthy and contract. No debridement is required ooLeft buttock slightly smaller with [name] tape injury wound just below which was new this week ooDorsal feet somewhat improved I have been using clindamycin ooLeft lateral looks lower extremity the actual open area looks worse although [name] lot of this is epithelialized. I am going to change to silver collagen today He has [name] lot more swelling in the right leg although this is not pitting not red and not particularly warm there is [name] lot of spasm in the right leg usually indicative of people with paralysis of some underlying discomfort. We have reviewed his vascular status from 2017 he had [name] left greater saphenous vein ablation. I wonder about referring him back to vascular surgery if the area on the left leg continues to deteriorate. 10/16/2018 in today for follow-up and management of multiple lower extremity ulcers. His left Buttock wound is much lower smaller and almost closed completely. The wound to the left ankle has began to reopen with Epithelialization and some adherent slough. He has multiple new areas to the left foot and leg. The left dorsal foot without much improvement. Wound present between left great webspace and 2nd toe. Erythema and edema present right leg. Right LE ultrasound obtained on 10/10/18 was negative for DVT . 10/23/2018; ooLeft buttock is closed over. Still dry macerated skin but there is no open wound. I suspect this is chronic pressure/moisture ooLeft lateral calf is quite [name] bit worse than when I saw this last. There is clearly drainage here he has macerated skin into the left plantar heel. We will change the primary dressing to alginate ooLeft dorsal foot has some improvement in overall wound  area. Still using clindamycin and silver alginate ooRight dorsal foot about the same as the left using clindamycin and silver alginate ooThe erythema in the right leg has resolved. He is DVT rule out was negative ooLeft heel pressure area required debridement although the wound is smaller and the surface is health 10/26/2018 ooThe patient came back in for his nurse check today predominantly because of the drainage coming out of the left lateral leg with [name] recent reopening of his original wound on the left lateral calf. He comes in today with [name] large amount of surrounding erythema around the wound extending from the calf into the ankle and even in the area on the dorsal foot. He is not systemically unwell. He is not febrile. Nevertheless this looks like cellulitis. We have been using silver alginate to the area. I changed him to [name] regular visit and I am going to prescribe him doxycycline. The rationale here is [name] long list of medication intolerances and [name] history of MRSA. I did not see anything that I thought would provide [name] valuable culture 10/30/2018 ooFollow-up from his appointment 4 days ago with really an extensive area of cellulitis in the left calf left lateral ankle and left dorsal foot. I put him on doxycycline. He has [name] long list of medication allergies which are true allergy reactions. 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 In any case he is [name] lot better today. The erythema especially in the anterior and lateral left calf is better. He still has left ankle erythema. He also is complaining about increasing edema in the right leg we have  only been using Kerlix Coban and he has been doing the wraps at home. Finally he has [name] spotty rash on the medial part of his upper left calf which looks like folliculitis or perhaps wrap occlusion type injury. Small superficial macules not pustules 11/06/18 patient arrives today with  again [name] considerable degree of erythema around the wound on the left lateral calf extending into the dorsal ankle and dorsal foot. This is [name] lot worse than when I saw this last week. He is on doxycycline really with not [name] lot of improvement. He has not been systemically unwell Wounds on the; left heel actually looks improved. Original area on the left foot and proximity to the first and second toes looks about the same. He has superficial areas on the dorsal foot, anterior calf and then the reopening of his original wound on the left lateral calf which looks about the same ooThe only area he has on the right is the dorsal webspace first and second which is smaller. ooHe has [name] large area of dry erythematous skin on the left buttock small open area here. 11/13/2018; the patient arrives in much better condition. The erythema around the wound on the left lateral calf is [name] lot better. Not sure whether this was the clindamycin or the TCA and ketoconazole or just in the improvement in edema control [stasis dermatitis]. In any case this is [name] lot better. The area on the left heel is very small and just about resolved using silver collagen we have been using silver alginate to the areas on his dorsal feet 11/20/2018; his wounds include the left lateral calf, left heel, dorsal aspects of both feet just proximal to the first second webspace. He is stable to slightly improved. I did not think any changes to his dressings were going to be necessary 11/27/2018 he has [name] reopening on the left buttock which is surrounded by what looks like tinea or perhaps some other form of dermatitis. The area on the left dorsal foot has some erythema around it I have marked this area but I am not sure whether this is cellulitis or not. Left heel is not closed. Left calf the reopening is really slightly longer and probably worse 1/13; in general things look better and smaller except for the left dorsal foot. Area on the left heel is  just about closed, left buttock looks better only [name] small wound remains in the skin looks better [using Lotrisone] 1/20; the area on the left heel only has [name] few remaining open areas here. Left lateral calf about the same in terms of size, left dorsal foot slightly larger right lateral foot still not closed. The area on the left buttock has no open wound and the surrounding skin looks [name] lot better 1/27; the area on the left heel is closed. Left lateral calf better but still requiring extensive debridements. The area on his left buttock is closed. He still has the open areas on the left dorsal foot which is slightly smaller in the right foot which is slightly expanded. We have been using Iodoflex on these areas as well 2/3; left heel is closed. Left lateral calf still requiring debridement using Iodoflex there is no open area on his left buttock however he has dry scaly skin over [name] large area of this. Not really responding well to the Lotrisone. Finally the areas on his dorsal feet at the level of the first second webspace are slightly smaller on the right and about the same  on the left. Both of these vigorously debrided with Anasept and gauze 2/10; left heel remains closed he has dry erythematous skin over the left buttock but there is no open wound here. Left lateral leg has come in and with. Still requiring debridement we have been using Iodoflex here. Finally the area on the left dorsal foot and right dorsal foot are really about the same extremely dry callused fissured areas. He does not yet have [name] dermatology appointment 2/17; left heel remains closed. He has [name] new open area on the left buttock. The area on the left lateral calf is bigger longer and still covered in necrotic debris. No major change in his foot areas bilaterally. I am awaiting for [name] dermatologist to look on this. We have been using ketoconazole I do not know that this is been doing any good at all. 2/24; left heel remains closed.  The left buttock wound that was new reopening last week looks better. The left lateral calf appears better also although still requires debridement. The major area on his foot is the left first second also requiring debridement. We have been putting Prisma on all wounds. I do not believe that the ketoconazole has done too much good for his feet. He will use Lotrisone I am going to give him [name] 2-week course of terbinafine. We still do not have [name] dermatology appointment 3/2 left heel remains closed however there is skin over bone in this area I pointed this out to him today. The left buttock wound is epithelialized but still does not look completely stable. The area on the left leg required debridement were using silver collagen here. With regards to his feet we changed to Lotrisone last week and silver alginate. 3/9; left heel remains closed. Left buttock remains closed. The area on the right foot is essentially closed. The left foot remains unchanged. Slightly smaller on the left lateral calf. Using silver collagen to both of these areas 3/16-Left heel remains closed. Area on right foot is closed. Left lateral calf above the lateral malleolus open wound requiring debridement with easy bleeding. Left dorsal wound proximal to first toe also debrided. Left ischial area open new. Patient has been using Prisma with wrapping every 3 days. Dermatology appointment is apparently tomorrow.Patient has completed his terbinafine 2-week course with some apparent improvement according to him, there is still flaking and dry skin in his foot on the left 3/23; area on the right foot is reopened. The area on the left anterior foot is about the same still [name] very necrotic adherent surface. He still has the area on the left leg and reopening is on the left buttock. He apparently saw dermatology although I do not have [name] note. According to the patient who is usually fairly well informed they did not have any good ideas. Put  him on oral terbinafine which she is been on before. 3/30; using silver collagen to all wounds. Apparently his dermatologist put him on doxycycline and rifampin presumably some culture grew staph. I do not have this result. He remains on terbinafine although I have used terbinafine on him before 4/6; patient has had [name] fairly substantial reopening on the right foot between the first and second toes. He is finished his terbinafine and I believe is on doxycycline and rifampin still as prescribed by dermatology. We have been using silver collagen to all his wounds although the patient reports that he thinks silver alginate does better on the wounds on his buttock. 4/13; the area  on his left lateral calf about the same size but it did not require debridement. ooLeft dorsal foot just proximal to the webspace between the first and second toes is about the same. Still nonviable surface. I note some superficial bronze discoloration of the dorsal part of his foot ooRight dorsal foot just proximal to the first and second toes also looks about the same. I still think there may be the same discoloration I noted above on the left ooLeft buttock wound looks about the same 4/20; left lateral calf appears to be gradually contracting using silver collagen. 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 The left dorsal foot wound is debrided of tightly adherent necrotic debris and really cleans up quite nicely. The right area is worse with expansion. I did not debride this it is now over the base of the second toe ooThe area on his left buttock is smaller no debridement is required using silver collagen 5/4; left calf continues to make good progress. ooHe arrives with erythema around the wounds on his dorsal foot which even extends to the plantar aspect. Very concerning for coexistent infection. He is finished the erythromycin I gave him for possible  erythrasma this does not seem to have helped. ooThe area on the left foot is about the same base of the dorsal toes ooIs area on the buttock looks improved on the left 5/11; left calf and left buttock continued to make good progress. Left foot is about the same to slightly improved. ooMajor problem is on the right foot. He has not had an x-ray. Deep tissue culture I did last week showed both Enterobacter and E. coli. I did not change the doxycycline I put him on empirically although neither 1 of these were plated to doxycycline. He arrives today with the erythema looking worse on both the dorsal and plantar foot. Macerated skin on the bottom of the foot. he has not been systemically unwell 5/18-Patient returns at 1 week, left calf wound appears to be making some progress, left buttock wound appears slightly worse than last time, left foot wound looks slightly better, right foot redness is marginally better. X-ray of both feet show no air or evidence of osteomyelitis. Patient is finished his Omnicef and terbinafine. He continues to have macerated skin on the bottom of the left foot as well as right 5/26; left calf wound is better, left buttock wound appears to have multiple small superficial open areas with surrounding macerated skin. X-rays that I did last time showed no evidence of osteomyelitis in either foot. He is finished cefdinir and doxycycline. I do not think that he was on terbinafine. He continues to have [name] large superficial open area on the right foot anterior dorsal and slightly between the first and second toes. I did send him to dermatology 2 months ago or so wondering about whether they would do [name] fungal scraping. I do not believe they did but did do [name] culture. We have been using silver alginate to the toe areas, he has been using antifungals at home topically either ketoconazole or Lotrisone. We are using silver collagen on the left foot, silver alginate on the right, silver  collagen on the left lateral leg and silver alginate on the left buttock 6/1; left buttock area is healed. We have the left dorsal foot, left lateral leg and right dorsal foot. We are using silver alginate to the areas on both feet and silver collagen to the area on his  left lateral calf 6/8; the left buttock apparently reopened late last week. He is not really sure how this happened. He is tolerating the terbinafine. Using silver alginate to all wounds 6/15; left buttock wound is larger than last week but still superficial. ooCame in the clinic today with [name] report of purulence from the left lateral leg I did not identify any infection ooBoth areas on his dorsal feet appear to be better. He is tolerating the terbinafine. Using silver alginate to all wounds 6/22; left buttock is about the same this week, left calf quite [name] bit better. His left foot is about the same however he comes in with erythema and warmth in the right forefoot once again. Culture that I gave him in the beginning of May showed Enterobacter and E. coli. I gave him doxycycline and things seem to improve although neither 1 of these organisms was specifically plated. 6/29; left buttock is larger and dry this week. Left lateral calf looks to me to be improved. Left dorsal foot also somewhat improved right foot completely unchanged. The erythema on the right foot is still present. He is completing the Ceftin dinner that I gave him empirically [see discussion above.) 7/6 - All wounds look to be stable and perhaps improved, the left buttock wound is slightly smaller, per patient bleeds easily, completed ceftin, the right foot redness is less, he is on terbinafine 7/13; left buttock wound about the same perhaps slightly narrower. Area on the left lateral leg continues to narrow. Left dorsal foot slightly smaller right foot about the same. We are using silver alginate on the right foot and Hydrofera Blue to the areas on the left. Unna boot  on the left 2 layer compression on the right 7/20; left buttock wound absolutely the same. Area on lateral leg continues to get better. Left dorsal foot require debridement as did the right no major change in the 7/27; left buttock wound the same size necrotic debris over the surface. The area on the lateral leg is closed once again. His left foot looks better right foot about the same although there is some involvement now of the posterior first second toe area. He is still on terbinafine which I have given him for [name] month, not certain [name] centimeter major change 06/25/19-All wounds appear to be slightly improved according to report, left buttock wound looks clean, both foot wounds have minimal to no debris the right dorsal foot has minimal slough. We are using Hydrofera Blue to the left and silver alginate to the right foot and ischial wound. 8/10-Wounds all appear to be around the same, the right forefoot distal part has some redness which was not there before, however the wound looks clean and small. Ischial wound looks about the same with no changes 8/17; his wound on the left lateral calf which was his original chronic venous insufficiency wound remains closed. Since I last saw him the areas on the left dorsal foot right dorsal foot generally appear better but require debridement. The area on his left initial tuberosity appears somewhat larger to me perhaps hyper granulated and bleeds very easily. We have been using Hydrofera Blue to the left dorsal foot and silver alginate to everything else 8/24; left lateral calf remains closed. The areas on his dorsal feet on the webspace of the first and second toes bilaterally both look better. The area on the left buttock which is the pressure ulcer stage II slightly smaller. I change the dressing to Hydrofera Blue to all  areas 8/31; left lateral calf remains closed. The area on his dorsal feet bilaterally look better. Using Hydrofera Blue. Still requiring  debridement on the left foot. No change in the left buttock pressure ulcers however 9/14; left lateral calf remains closed. Dorsal feet look quite [name] bit better than 2 [name] ago. Flaking dry skin also [name] lot better with the ammonium lactate I gave him 2 [name] ago. The area on the left buttock is improved. He states that his Roho cushion developed [name] leak and he is getting [name] new one, in the interim he is offloading this vigorously 9/21; left calf remains closed. Left heel which was [name] possible DTI looks better this week. He had macerated tissue around the left dorsal foot right foot looks satisfactory and improved left buttock wound. I changed his dressings to his feet to silver alginate bilaterally. Continuing Hydrofera Blue on the left buttock. 9/28 left calf remains closed. Left heel did not develop anything [possible DTI] dry flaking skin on the left dorsal foot. Right foot looks satisfactory. Improved left buttock wound. We are using silver alginate on his feet Hydrofera Blue on the buttock. I have asked him to go back to the Lotrisone on his feet including the wounds and surrounding areas 10/5; left calf remains closed. The areas on the left and right feet about the same. [name] lot of this is epithelialized however debris over the remaining open areas. He is using Lotrisone and silver alginate. The area on the left buttock using Hydrofera Blue 10/26. Patient has been out for 3 [name] secondary to Covid concerns. He tested negative but I think his wife tested positive. He comes in today with the left foot substantially worse, right foot about the same. Even more concerning he states that the area on his left buttock closed over but then reopened and is considerably deeper in one aspect than it was before [stage III wound] 11/2; left foot really about the same as last week. Quarter sized wound on the dorsal foot just proximal to the first second toes. Surrounding erythema with areas of denuded epithelium.  This is not really much different looking. Did not look like cellulitis this time however. ooRight foot area about the same.. We have been using silver alginate alginate on his toes ooLeft buttock still substantial irritated skin around the wound which I think looks somewhat better. We have been using Hydrofera Blue here. 11/9; left foot larger than last week and [name] very necrotic surface. Right foot I think is about the same perhaps slightly smaller. Debris around the circumference also addressed. Unfortunately on the left buttock there is been [name] decline. Satellite lesions below the major wound distally and now [name] an additional one posteriorly we have been using Hydrofera Blue but I think this is [name] pressure issue 11/16; left foot ulcer dorsally again [name] very adherent necrotic surface. Right foot is about the same. Not much change in the pressure ulcer on his left buttock. 11/30; left foot ulcer dorsally basically the same as when I saw him 2 [name] ago. Very adherent fibrinous debris on the wound surface. Patient reports [name] lot of drainage as well. 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 We have been using Iodoflex, patient changed back to alginate because of the drainage. Area on his right dorsal foot really looks benign with [name] healthier surface certainly [name] lot better than on the left. Left buttock wounds all improved using Hydrofera Blue 12/7; left dorsal  foot again no improvement. Tightly adherent debris. PCR culture I did last week only showed likely skin contaminant. I have gone ahead and done [name] punch biopsy of this which is about the last thing in terms of investigations I can think to do. He has known venous insufficiency and venous hypertension and this could be the issue here. The area on the right foot is about the same left buttock slightly worse according to our intake nurse secondary to [facility] Blue sticking to the wound 12/14; biopsy of  the left foot that I did last time showed changes that could be related to wound healing/chronic stasis dermatitis phenomenon no neoplasm. We have been using silver alginate to both feet. I change the one on the left today to Sorbact and silver alginate to his other 2 wounds 12/28; the patient arrives with the following problems; ooMajor issue is the dorsal left foot which continues to be [name] larger deeper wound area. Still with [name] completely nonviable surface ooParadoxically the area mirror image on the right on the right dorsal foot appears to be getting better. ooHe had some loss of dry denuded skin from the lower part of his original wound on the left lateral calf. Some of this area looked [name] little vulnerable and for this reason we put him in wrap that on this side this week ooThe area on his left buttock is larger. He still has the erythematous circular area which I think is [name] combination of pressure, sweat. This does not look like cellulitis or fungal dermatitis 11/26/2019; -Dorsal left foot large open wound with depth. Still debris over the surface. Using Sorbact ooThe area on the dorsal right foot paradoxically has closed over [facility] has [name] reopening on the left ankle laterally at the base of his original wound that extended up into the calf. This appears clean. ooThe left buttock wound is smaller but with very adherent necrotic debris over the surface. We have been using silver alginate here as well The patient had arterial studies done in 2017. He had biphasic waveforms at the dorsalis pedis and posterior tibial bilaterally. ABI in the left was 1.17. Digit waveforms were dampened. He has slight spasticity in the great toes I do not think [name] TBI would be possible 1/11; the patient comes in today with [name] sizable reopening between the first and second toes on the right. This is not exactly in the same location where we have been treating wounds previously. According to our intake nurse this was  actually fairly deep but 0.6 cm. The area on the left dorsal foot looks about the same the surface is somewhat cleaner using Sorbact, his MRI is in 2 days. We have not managed yet to get arterial studies. The new reopening on the left lateral calf looks somewhat better using alginate. The left buttock wound is about the same using alginate 1/18; the patient had his ARTERIAL studies which were quite normal. ABI in the right at 1.13 with triphasic/biphasic waveforms on the left ABI 1.06 again with triphasic/biphasic waveforms. It would not have been possible to have done [name] toe brachial index because of spasticity. We have been using Sorbac to the left foot alginate to the rest of his wounds on the right foot left lateral calf and left buttock 1/25; arrives in clinic with erythema and swelling of the left forefoot worse over the first MTP area. This extends laterally dorsally and but also posteriorly. Still has an area on the left lateral part of the lower  part of his calf wound it is eschared and clearly not closed. ooArea on the left buttock still with surrounding irritation and erythema. ooRight foot surface wound dorsally. The area between the right and first and second toes appears better. 2/1; ooThe left foot wound is about the same. Erythema slightly better I gave him [name] week of doxycycline empirically ooRight foot wound is more extensive extending between the toes to the plantar surface ooLeft lateral calf really no open surface on the inferior part of his original wound however the entire area still looks vulnerable ooAbsolutely no improvement in the left buttock wound required debridement. 2/8; the left foot is about the same. Erythema is slightly improved I gave him clindamycin last week. ooRight foot looks better he is using Lotrimin and silver alginate ooHe has [name] breakdown in the left lateral calf. Denuded epithelium which I have removed ooLeft buttock about the same were using  Hydrofera Blue 2/15; left foot is about the same there is less surrounding erythema. Surface still has tightly adherent debris which I have debriding however not making any progress ooRight foot has [name] substantial wound on the medial right second toe between the first and second webspace. ooStill an open area on the left lateral calf distal area. ooButtock wound is about the same 2/22; left foot is about the same less surrounding erythema. Surface has adherent debris. Polymen Ag Right foot area significant wound between the first and second toes. We have been using silver alginate here Left lateral leg polymen Ag at the base of his original venous insufficiency wound ooLeft buttock some improvement here 3/1; ooRight foot is deteriorating in the first second toe webspace. Larger and more substantial. We have been using silver alginate. ooLeft dorsal foot about the same markedly adherent surface debris using PolyMem Ag ooLeft lateral calf surface debris using PolyMem AG ooLeft buttock is improved again using PolyMem Ag. ooHe is completing his terbinafine. The erythema in the foot seems better. He has been on this for 2 [name] 3/8; no improvement in any wound area in fact he has [name] small open area on the dorsal midfoot which is new this week. He has not gotten his foot x-rays yet 3/15; his x-rays were both negative for osteomyelitis of both feet. No major change in any of his wounds on the extremities however his buttock wounds are better. We have been using polymen on the buttocks, left lower leg. Iodoflex on the left foot and silver alginate on the right 3/22; arrives in clinic today with the 2 major issues are the improvement in the left dorsal foot wound which for once actually looks healthy with [name] nice healthy wound surface without debridement. Using Iodoflex here. Unfortunately on the left lateral calf which is in the distal part of his original wound he came to the clinic here for  there was purulent drainage noted some increased breakdown scattered around the original area and [name] small area proximally. We we are using polymen here will change to silver alginate today. His buttock wound on the left is better and I think the area on the right first second toe webspace is also improved 3/29; left dorsal foot looks better. Using Iodoflex. Left ankle culture from deterioration last time grew E. coli, Enterobacter and Enterococcus. I will give him [name] course of cefdinir although that will not cover Enterococcus. The area on the right foot in the webspace of the first and second toe lateral first toe looks better. The area on his buttock  is about healed Vascular appointment is on April 21. This is to look at his venous system vis--vis continued breakdown of the wounds on the left including the left lateral leg and left dorsal foot he. He has had previous ablations on this side 4/5; the area between the right first and second toes lateral aspect of the first toe looks better. Dorsal aspect of the left first toe on the left foot also improved. Unfortunately the left lateral lower leg is larger and there is [name] second satellite wound superiorly. The usual superficial abrasions on the left buttock overall better but certainly not closed 4/12; the area between the right first and second toes is improved. Dorsal aspect of the left foot also slightly smaller with [name] vibrant healthy looking surface. No real change in the left lateral leg and the left buttock wound is healed He has an unaffordable co-pay for Apligraf. Appointment with vein and vascular with regards to the left leg venous part of the circulation is on 4/21 4/19; we continue to see improvement in all wound areas. Although this is minor. He has his vascular appointment on 4/21. The area on the left buttock has not reopened although right in the center of this area the skin looks somewhat threatened 4/26; the left buttock is  unfortunately reopened. In general his left dorsal foot has [name] healthy surface and looks somewhat smaller although it was not measured as such. The area between his first and second toe webspace on the right as [name] small wound against the first toe. The patient saw vascular surgery. The real question I was asking was about the small saphenous vein on the left. He has previously ablated left greater saphenous vein. Nothing further was commented on on the left. Right greater saphenous vein without reflux at the saphenofemoral junction or proximal thigh there was no indication for ablation of the right greater saphenous vein duplex was negative for DVT bilaterally. They did not think there was anything from [name] vascular surgery point of view that could be offered. They ABIs within normal limits 5/3; only small open area on the left buttock. The area on the left lateral leg which was his original venous reflux is now 2 wounds both which look clean. We are using Iodoflex on the left dorsal foot which looks healthy and smaller. He is down to [name] very tiny area between the right first and second toes, using silver alginate 5/10; all of his wounds appear better. We have much better edema control in 4 layer compression on the left. This may be the factor that is allowing the left foot and left lateral calf to heal. He has external compression garments at home 04/14/20-All of his wounds are progressing well, the left forefoot is practically closed, left ischium appears to be about the same, right toe webspace is also smaller. The left lateral leg is about the same, continue using Hydrofera Blue to this, silver alginate to the ischium, Iodoflex to the toe space on the right 6/7; most of his wounds outside of the left buttock are doing well. The area on the left lateral calf and left dorsal foot are smaller. The area on the right foot in between the first and second toe webspace is barely visible although he still says  there is some drainage here is the only reason I did not heal this out. ooUnfortunately the area on the left buttock almost looks like he has [name] skin tear from tape. He has open wound and  then [name] large flap of skin that we are trying to get adherence over an area just next to the remaining wound 6/21; 2 week follow-up. I believe is been here for nurse visits. Miraculously the area between his first and second toes on the left dorsal foot is closed over. Still open on the right first second web space. The left lateral calf has 2 open areas. Distally this is more superficial. The proximal area had [name] little more depth and required debridement of adherent necrotic material. His buttock wound is actually larger we have been using silver alginate here 6/28; the patient's area on the left foot remains closed. Still open wet area between the first and second toes on the right and also extending into the plantar aspect. We have been using silver alginate in this location. He has 2 areas on the left lower leg part of his original long wounds which I think are better. We have been using Hydrofera Blue here. Hydrofera Blue to the left buttock which is stable 7/12; left foot remains closed. Left ankle is closed. May be [name] small area between his right first and second toes the only truly open area is on the left buttock. We have been using Hydrofera Blue here Objective Constitutional Sitting or standing Blood Pressure is within target range for patient.. Pulse regular and within target range for patient.Marland Kitchen Respirations regular, non-labored and within target range.. Temperature is normal and within the target range for the patient.Marland Kitchen Appears in no distress. Vitals Time Taken: 7:45 AM, Height: 70 in, Weight: 216 lbs, BMI: 31, Temperature: 98.2 F, Pulse: 101 bpm, Respiratory Rate: 18 breaths/min, Blood Pressure: 116/72 mmHg. General Notes: Wound exam ooLeft ischial tuberosity smaller healthy looking wound surface.  The continued irritated skin around the large area of the buttock. ooLeft lateral leg is closed. I removed some eschar there is no open wound here. ooLeft dorsal foot remains closed again callused dry skin but no open wound ooThe right first and second toes. Lateral aspect of the first toe. There may be [name] small open area here under illumination. Very difficult to see mostly because of extreme spasm that separating his toes initiates Integumentary (Hair, Skin) The skin around the area on the left buttock is flaking dry and erythematous.. Wound #37 status is Healed - Epithelialized. Original cause of wound was Gradually Appeared. The wound is located on the Left,Lateral Malleolus. The wound measures 0cm length x 0cm width x 0cm depth; 0cm^2 area and 0cm^3 volume. There is no tunneling or undermining noted. There is [name] none present amount of drainage noted. The wound margin is flat and intact. There is no granulation within the wound bed. There is no necrotic tissue within the wound bed. Wound #38 status is Open. Original cause of wound was Gradually Appeared. The wound is located on the Right T - Web between 1st and 2nd. The wound oe measures 0.8cm length x 0.2cm width x 0.3cm depth; 0.126cm^2 area and 0.038cm^3 volume. There is Fat Layer (Subcutaneous Tissue) Exposed exposed. There is no tunneling or undermining noted. There is [name] small amount of serosanguineous drainage noted. The wound margin is distinct with the outline attached to the wound base. There is large (67-100%) red granulation within the wound bed. There is [name] small (1-33%) amount of necrotic tissue within the wound bed including Adherent Slough. Wound #41 status is Open. Original cause of wound was Gradually Appeared. The wound is located on the Left Ischium. The wound measures 2.8cm length x 1.5cm  width x 0.1cm depth; 3.299cm^2 area and 0.33cm^3 volume. There is Fat Layer (Subcutaneous Tissue) Exposed exposed. There is no tunneling  or undermining noted. There is [name] medium amount of serosanguineous drainage noted. The wound margin is distinct with the outline attached to the wound base. There is large (67-100%) pink, friable granulation within the wound bed. There is no necrotic tissue within the wound bed. 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 Dressing Change Frequency: Wound #38 Right T - Web between 1st and 2nd: oe Change Dressing every other day. Wound #41 Left Ischium: Change Dressing every other day. Skin Barriers/Peri-Wound Care: Moisturizing lotion - both legs Primary Wound Dressing: Wound #38 Right T - Web between 1st and 2nd: oe Calcium Alginate with Silver Wound #41 Left Ischium: Hydrofera Blue Secondary Dressing: Wound #38 Right T - Web between 1st and 2nd: oe Kerlix/Rolled Gauze - secure with tape Dry Gauze Wound #41 Left Ischium: Dry Gauze ABD pad Edema Control: Elevate legs to the level of the heart or above for 30 minutes daily and/or when sitting, [name] frequency of: - throughout the day Support Garment 30-40 mm/Hg pressure to: - Juxtalite to both legs daily Off-Loading: Low air-loss mattress (Group 2) Roho cushion for wheelchair Turn and reposition every 2 hours - out of wheelchair throughout the day, try to lay on sides, sleep in the bed not the recliner 1. We are continuing with Hydrofera Blue on the left buttock. 2. I am not exactly sure what is causing the skin to be chronically irritated. We have attempted to treat him for tinea multiple times in the past this is has not helped also steroids. This may all be [name] pressure issue  friction etc. 3. I have counseled him that we will need tight compression on the left ankle. He has chronic venous insufficiency aggravated by his paraplegic status with his legs dependent. He does not have overt edema however any degree an increase in the venous hypertension we will open this area on the left lateral leg and ankle Electronic Signature(s) Signed: 06/03/2020 8:12:39 AM By: Linton Ham MD Entered By: Linton Ham on 06/02/2020 08:43:18 -------------------------------------------------------------------------------- SuperBill Details Patient Name: Date of Service: Burtis, [name] [name] 06/02/2020 Medical Record Number: [id number] Patient Account Number: [id number] Date of Birth/Sex: Treating RN: 05-15-1986 (33 y.o. [name] Primary Care Provider: [place], [place] Other Clinician: Referring Provider: Treating Provider/Extender: [name] in Treatment: 230 Diagnosis Coding ICD-10 Codes Code Description I87.332 Chronic venous hypertension (idiopathic) with ulcer and inflammation of left lower extremity L97.321 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 to breakdown of skin L97.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, 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, complete Facility Procedures CPT4 Code: [id number] Description: 99213 - WOUND CARE VISIT-LEV 3 EST PT Modifier: Quantity: 1 Physician Procedures : CPT4 Code Description Modifier [id number] - WC PHYS LEVEL 3 - EST PT ICD-10 Diagnosis Description 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, 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 Quantity: 1 Electronic Signature(s) Signed: 06/03/2020 8:12:39 AM By: Linton Ham MD Entered By: Linton Ham  on 06/02/2020 08:43:46

## 2020-06-09 ENCOUNTER — Other Ambulatory Visit (HOSPITAL_COMMUNITY)
Admission: RE | Admit: 2020-06-09 | Discharge: 2020-06-09 | Disposition: A | Discharge status: Home / Self Care | Payer: BC Managed Care – PPO | Source: Other Acute Inpatient Hospital | Attending: Internal Medicine | Admitting: Internal Medicine

## 2020-06-09 ENCOUNTER — Encounter (HOSPITAL_BASED_OUTPATIENT_CLINIC_OR_DEPARTMENT_OTHER): Payer: BC Managed Care – PPO | Admitting: Internal Medicine

## 2020-06-09 DIAGNOSIS — L89323 Pressure ulcer of left buttock, stage 3: Secondary | ICD-10-CM | POA: Diagnosis not present

## 2020-06-09 DIAGNOSIS — I890 Lymphedema, not elsewhere classified: Secondary | ICD-10-CM | POA: Diagnosis not present

## 2020-06-09 DIAGNOSIS — L97521 Non-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: Secondary | ICD-10-CM | POA: Diagnosis not present

## 2020-06-09 DIAGNOSIS — G8221 Paraplegia, complete: Secondary | ICD-10-CM | POA: Diagnosis not present

## 2020-06-09 DIAGNOSIS — L97322 Non-pressure chronic ulcer of left 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: Secondary | ICD-10-CM | POA: Diagnosis not present

## 2020-06-09 DIAGNOSIS — L97311 Non-pressure chronic ulcer of right ankle limited to breakdown of skin: Secondary | ICD-10-CM | POA: Diagnosis not present

## 2020-06-09 DIAGNOSIS — L97321 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 to breakdown of skin: Secondary | ICD-10-CM | POA: Diagnosis not present

## 2020-06-09 DIAGNOSIS — L03116 Cellulitis of left lower limb: Secondary | ICD-10-CM | POA: Diagnosis not present

## 2020-06-09 DIAGNOSIS — Z8614 Personal history of Methicillin resistant Staphylococcus aureus infection: Secondary | ICD-10-CM | POA: Diagnosis not present

## 2020-06-09 DIAGNOSIS — Z87440 Personal history of urinary (tract) infections: Secondary | ICD-10-CM | POA: Diagnosis not present

## 2020-06-09 DIAGNOSIS — I87332 Chronic venous hypertension (idiopathic) with ulcer and inflammation of left lower extremity: Secondary | ICD-10-CM | POA: Diagnosis not present

## 2020-06-09 DIAGNOSIS — L97511 Non-pressure chronic ulcer of other part of right foot limited to breakdown of skin: Secondary | ICD-10-CM | POA: Diagnosis not present

## 2020-06-09 DIAGNOSIS — Z87891 Personal history of nicotine dependence: Secondary | ICD-10-CM | POA: Diagnosis not present

## 2020-06-09 DIAGNOSIS — I872 Venous insufficiency (chronic) (peripheral): Secondary | ICD-10-CM | POA: Diagnosis not present

## 2020-06-10 NOTE — Progress Notes (Signed)
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 this continues to contract. I changed to silver collagen last week because of dryness of the wound bed. Using Iodoflex to the areas on his first and second toes/web space bilaterally 06/24/17; patient with history of paraplegia also chronic venous insufficiency with lymphedema. Has [name] very difficult wound on the left lateral leg. This has been gradually reducing in terms of with but comes in with [name] very dry adherent surface. High switch to silver collagen [name] week or so ago with hydrogel to keep the area moist. This is been refractory to multiple dressing attempts. He also has areas in his first and second toes bilaterally in the anterior and posterior web space. I had been using Iodoflex here after [name] prolonged course of silver alginate with ketoconazole was ineffective [question tinea pedis] 07/14/17; patient arrives today with [name] very difficult adherent material over his left lateral lower leg wound. He also has surrounding erythema and poorly controlled edema. He was switched his Santyl last visit which the nurses are applying once during his doctor visit and once on [name] nurse visit. He was also reduced to 2 layer compression I'[name] not exactly sure of the issue here. 07/21/17; better surface today after 1 week of Iodoflex. Significant cellulitis that we treated last week also better. [Doxycycline] 07/28/17 better surface today with  now 2 weeks of Iodoflex. Significant cellulitis treated with doxycycline. He has now completed the doxycycline and he is back to his usual degree of chronic venous inflammation/stasis dermatitis. He reminds me he has had  ablations surgery here 08/04/17; continued improvement with Iodoflex to the left lateral leg wound in terms of the surface of the wound although the dimensions are better. 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 Problematic areas on the plantar aspect of the first second toe web space on the left and the dorsal aspect of the first second toe web space on the right. At one point I felt these were probably related to chronic fungal infections in treated him aggressively for this although we have not made any improvement here. 08/11/17; left lateral leg. Surface continues to improve with the Iodoflex although we are not seeing much improvement in overall wound dimensions. Areas on his plantar left foot and right foot show no improvement. In fact the right foot looks somewhat worse 08/18/17; left lateral leg. We changed to [facility] Blue last week after [name] prolonged course of Iodoflex which helps get the surface better. It appears that the wound with is improved. Continue with difficult areas on the left dorsal first second and plantar first second on the right 09/01/17; patient arrives in clinic today having had [name] temperature of 103 yesterday. He was seen in the ER and [facility]. The patient was concerned he could have cellulitis again in the right leg however they diagnosed him with [name] UTI and he is now on Keflex. He has [name] history of cellulitis which is been recurrent and difficult but this is been in the left leg, in the past 5 use doxycycline. He does in and out catheterizations at home which are risk factors for UTI 09/08/17; patient will be completing his Keflex this weekend. The erythema on the left leg is considerably better. He has [name] new wound today on the medial part of the right leg small superficial almost looks like [name] skin tear. He has worsening of the area on the right dorsal first and second toe. His major area on the  left lateral leg is better. Using Hydrofera Blue on all areas 09/15/17; gradual reduction in width on the long wound in the left lateral leg. No debridement required. He also has wounds on the plantar aspect of his left first second toe web space and on the dorsal aspect of the right first second toe web space. 09/22/17; there continues to be very gradual improvements in the dimensions of the left lateral leg wound. He hasn't round erythematous spot with might be pressure on his wheelchair. There is no evidence obviously of infection no purulence no warmth He has [name] dry scaled area on the plantar aspect of the left first second toe Improved area on the dorsal right first second toe. 09/29/17; left lateral leg wound continues to improve in dimensions mostly with an is still [name] fairly long but increasingly narrow wound. He has [name] dry scaled area on the plantar aspect of his left first second toe web space Increasingly concerning area on the dorsal right first second toe. In fact I am concerned today about possible cellulitis around this wound. The areas extending up his second toe and although there is deformities here almost appears to abut on the nailbed. 10/06/17; left lateral leg wound continues to make very gradual progress. Tissue culture I did from the right first second toe dorsal  foot last time grew MRSA and enterococcus which was vancomycin sensitive. This was not sensitive to clindamycin or doxycycline. He is allergic to Zyvox and sulfa we have therefore arrange for him to have dalvance infusion tomorrow. He is had this in the past and tolerated it well 10/20/17; left lateral leg wound continues to make decent progress. This is certainly reduced in terms of with there is advancing epithelialization.The cellulitis in the right foot looks better although he still has [name] deep wound in the dorsal aspect of the first second toe web space. Plantar left first toe web space on the left I think is making  some progress 10/27/17; left lateral leg wound continues to make decent progress. Advancing epithelialization.using Hydrofera Blue The right first second toe web space wound is better-looking using silver alginate Improvement in the left plantar first second toe web space. Again using silver alginate 11/03/17 left lateral leg wound continues to make decent progress albeit slowly. Using [facility] The right per second toe web space continues to be [name] very problematic looking punched out wound. I obtained [name] piece of tissue for deep culture I did extensively treated this for fungus. It is difficult to imagine that this is [name] pressure area as the patient states other than going outside he doesn't really wear shoes at home The left plantar first second toe web space looked fairly senescent. Necrotic edges. This required debridement change to [facility] Blue to all wound areas 11/10/17; left lateral leg wound continues to contract. Using Hydrofera Blue On the right dorsal first second toe web space dorsally. Culture I did of this area last week grew MRSA there is not an easy oral option in this patient was multiple antibiotic allergies or intolerances. This was only [name] rare culture isolate I'[name] therefore going to use Bactroban under silver alginate On the left plantar first second toe web space. Debridement is required here. This is also unchanged 11/17/17; left lateral leg wound continues to contract using Hydrofera Blue this is no longer the major issue. The major concern here is the right first second toe web space. He now has an open area going from dorsally to the plantar aspect. There is now wound on the inner lateral part of the first toe. Not [name] very viable surface on this. There is erythema spreading medially into the forefoot. No major change in the left first second toe plantar wound 11/24/17; left lateral leg wound continues to contract using Hydrofera Blue. Nice improvement today The right first  second toe web space all of this looks [name] lot less angry than last week. I have given him clindamycin and topical Bactroban for MRSA and terbinafine for the possibility of underlining tinea pedis that I could not control with ketoconazole. Looks somewhat better The area on the plantar left first second toe web space is weeping with dried debris around the wound 12/01/17; left lateral leg wound continues to contract he Hydrofera Blue. It is becoming thinner in terms of with nevertheless it is making good improvement. The right first second toe web space looks less angry but still [name] large necrotic-looking wounds starting on the plantar aspect of the right foot extending between the toes and now extensively on the base of the right second toe. I gave him clindamycin and topical Bactroban for MRSA anterior benefiting for the possibility of underlying tinea pedis. Not looking better today The area on the left first/second toe looks better. Debrided of necrotic debris 12/05/17* the patient was worked in urgently  today because over the weekend he found blood on his incontinence bad when he woke up. He was found to have an ulcer by his wife who does most of his wound care. He came in today for [place] to look at this. He has not had [name] history of wounds in his buttocks in spite of his paraplegia. 12/08/17; seen in follow-up today at his usual appointment. He was seen earlier this week and found to have [name] new wound on his buttock. We also follow him for wounds on the left lateral leg, left first second toe web space and right first second toe web space 12/15/17; we have been using Hydrofera Blue to the left lateral leg which has improved. The right first second toe web space has also improved. Left first second toe web space plantar aspect looks stable. The left buttock has worsened using Santyl. Apparently the buttock has drainage 12/22/17; we have been using Hydrofera Blue to the left lateral leg which continues to  improve now 2 small wounds separated by normal skin. He tells [place] he had [name] fever up to 100 yesterday he is prone to UTIs but has not noted anything different. He does in and out catheterizations. The area between the first and second toes today does not look good necrotic surface covered with what looks to be purulent drainage and erythema extending into the third toe. I had gotten this to something that I thought look better last time however it is not look good today. He also has [name] necrotic surface over the buttock wound which is expanded. I thought there might be infection under here so I removed [name] lot of the surface with [name] #5 curet though nothing look like it really needed culturing. He is been using Santyl to this area 12/27/17; his original wound on the left lateral leg continues to improve using Hydrofera Blue. I gave him samples of Baxdella although he was unable to take them out of fear for an allergic reaction ["lump in his throat"].the culture I did of the purulent drainage from his second toe last week showed both enterococcus and [name] set Enterobacter I was also concerned about the erythema on the bottom of his foot although paradoxically although this looks somewhat better today. 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 We've been using silver alginate here. They came up today that he sleeps in [name] recliner, I'[name] not sure why but I asked him to stop this 01/03/18; his original wound we've been using Hydrofera Blue is now separated into 2 areas. Ulcer on his left buttock is better he is off the recliner and sleeping in bed Finally both wound areas between his first and second toes also looks some better 01/10/18; his original wound on the left lateral leg is now separated into 2 wounds we've been using Hydrofera Blue Ulcer on his left buttock has some drainage. There is [name] small probing site going into  muscle layer superiorly.using silver alginate -He arrives today with [name] deep tissue injury on the left heel The wound on the dorsal aspect of his first second toe on the left looks [name] lot betterusing silver alginate ketoconazole The area on the first second toe web space on the right also looks [name] lot bette 01/17/18; his original wound on the left lateral leg continues to progress using Hydrofera Blue Ulcer on his left buttock also is smaller surface healthier except for  [name] small probing site going into the muscle layer superiorly. 2.4 cm of tunneling in this area DTI on his left heel we have only been offloading. Looks better than last week no threatened open no evidence of infection the wound on the dorsal aspect of the first second toe on the left continues to look like it's regressing we have only been using silver alginate and terbinafine orally The area in the first second toe web space on the right also looks to be [name] lot better using silver alginate and terbinafine I think this was prompted by tinea pedis 01/31/18; the patient was hospitalized in [place] last week apparently for [name] complicated UTI. He was discharged on cefepime he does in and out catheterizations. In the hospital he was discovered [name] I don't mild elevation of AST and ALT and the terbinafine was stopped.predictably the pressure ulcer on s his buttock looks betterusing silver alginate. The area on the left lateral leg also is better using Hydrofera Blue. The area between the first and second toes on the left better. First and second toes on the right still substantial but better. Finally the DTI on the left heel has held together and looks like it's resolving 02/07/18-he is here in follow-up evaluation for multiple ulcerations. He has new injury to the lateral aspect of the last issue [name] pressure ulcer, he states this is from adhesive removal trauma. He states he has tried multiple adhesive products with no success. All other ulcers  appear stable. The left heel DTI is resolving. We will continue with same treatment plan and follow-up next week. 02/14/18; follow-up for multiple areas. He has [name] new area last week on the lateral aspect of his pressure ulcer more over the posterior trochanter. The original pressure ulcer looks quite stable has healthy granulation. 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 Almost fully epithelialized on the original superior area using Hydrofera Blue DTI on the left heel has peeled off this week to reveal [name] small superficial wound under denuded skin and subcutaneous tissue Both areas between the first and second toes look better including nothing open on the left 02/21/18; The patient's wounds on his left ischial tuberosity and posterior left greater trochanter actually looked better. He has [name] large area of irritation around the area which I think is contact dermatitis. I am doubtful that this is fungal His original wound on the left lateral calf continues to improve we have been using Hydrofera Blue There is no open area in the left first second toe web space although there is [name] lot of thick callus The DTI on the left heel required debridement today of necrotic surface eschar and subcutaneous tissue using silver alginate Finally the area on the right first second toe webspace continues to contract using silver alginate and ketoconazole 02/28/18 Left ischial tuberosity wounds look better using silver alginate. Original wound on the left calf only has one small open area left using Hydrofera Blue DTI on the left heel required debridement mostly removing skin from around this wound surface. Using silver alginate The areas on the right first/second toe web space using silver alginate and ketoconazole 03/08/18 on evaluation today patient appears to be doing decently well as best I can tell in regard to  his wounds. This is the first time that I have seen him as he generally is followed by Dr. Dellia Nims. With that being said none of  his wounds appear to be infected he does have an area where there is some skin covering what appears to be [name] new wound on the left dorsal surface of his great toe. This is right at the nail bed. With that being said I do believe that debrided away some of the excess skin can be of benefit in this regard. Otherwise he has been tolerating the dressing changes without complication. 03/14/18; patient arrives today with the multiplicity of wounds that we are following. He has not been systemically unwell Original wound on the left lateral calf now only has 2 small open areas we've been using Hydrofera Blue which should continue The deep tissue injury on the left heel requires debridement today. We've been using silver alginate The left first second toe and the right first second toe are both are reminiscence what I think was tinea pedis. Apparently some of the callus Surface between the toes was removed last week when it started draining. Purulent drainage coming from the wound on the ischial tuberosity on the left. 03/21/18-He is here in follow-up evaluation for multiple wounds. 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 He tolerated debridement. The only change to last week's recommendations is to discontinue antifungal cream between toes. He will follow-up next week 03/28/18; following up for multiple wounds;Concern this week is streaking redness and swelling in the right foot. He is going to need antibiotics for this. 03/31/18; follow-up for right foot cellulitis. Streaking redness and swelling in the right foot on 03/28/18. 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 I put him on clindamycin 300 mg every 6 and brought him in for [name] quick check. He has an open wound between his first and  second toes on the right foot as [name] potential source. 04/04/18; Right foot cellulitis is resolving he is completing clindamycin. This is truly good news Left lateral calf wound which is initial wound only has one small open area inferiorly this is close to healing out. He has compression stockings. We will use Hydrofera Blue right down to the epithelialization of this Nonviable surface on the left heel which was initially pressure with [name] DTI. We've been using Hydrofera Blue. I'[name] going to switch this back to silver alginate Left first second toe/tinea pedis this looks better using silver alginate Right first second toe tinea pedis using silver alginate Large pressure ulcers on theLeft ischial tuberosity. Small wound here Looks better. I am uncertain about the surface over the large wound. Using silver alginate 04/11/18; Cellulitis in the right foot is resolved Left lateral calf wound which was his original wounds still has 2 tiny open areas remaining this is just about closed Nonviable surface on the left heel is better but still requires debridement Left first second toe/tinea pedis still open using silver alginate Right first second toe wound tinea pedis I asked him to go back to using ketoconazole and silver alginate Large pressure ulcers on the left ischial tuberosity this shear injury here is resolved. Wound is smaller. No evidence of infection using silver alginate 04/18/18; Patient arrives with an intense area of cellulitis in the right mid lower calf extending into the right heel area. Bright red and warm. Smaller area on the left anterior leg. He has [name] significant history of MRSA. He will definitely need antibioticsdoxycycline He now has 2 open areas on the left ischial tuberosity the original large wound and now [name] satellite area which I think  was above his initial satellite areas. Not [name] wonderful surface on this satellite area surrounding erythema which looks like pressure related. His  left lateral calf wound again his original wound is just about closed Left heel pressure injury still requiring debridement Left first second toe looks [name] lot better using silver alginate Right first second toe also using silver alginate and ketoconazole cream also looks better 04/20/18; the patient was worked in early today out of concerns with his cellulitis on the right leg. I had started him on doxycycline. This was 2 days ago. His wife was concerned about the swelling in the area. Also concerned about the left buttock. He has not been systemically unwell no fever chills. No nausea vomiting or diarrhea 04/25/18; the patient's left buttock wound is continued to deteriorate he is using Hydrofera Blue. He is still completing clindamycin for the cellulitis on the right leg although all of this looks better. 05/02/18 Left buttock wound still with [name] lot of drainage and [name] very tightly adherent fibrinous necrotic surface. He has [name] deeper area superiorly The left lateral calf wound is still closed DTI wound on the left heel necrotic surface especially the circumference using Iodoflex Areas between his left first second toe and right first second toe both look better. Dorsally and the right first second toe he had [name] necrotic surface although at smaller. In using silver alginate and ketoconazole. I did [name] culture last week which was [name] deep tissue culture of the reminiscence of the open wound on the right first second toe dorsally. This grew [name] few Acinetobacter and [name] few methicillin-resistant staph aureus. Nevertheless the area actually this week looked better. I didn't feel the need to specifically address this at least in terms of systemic antibiotics. 05/09/18; wounds are measuring larger more drainage per our intake. We are using Santyl covered with alginate on the large superficial buttock wounds, Iodosorb on the left heel, ketoconazole and silver alginate to the dorsal first and second toes  bilaterally. 05/16/18; The area on his left buttock better in some aspects although the area superiorly over the ischial tuberosity required an extensive debridement.using Santyl Left heel appears stable. Using Iodoflex The areas between his first and second toes are not bad however there is spreading erythema up the dorsal aspect of his left foot this looks like cellulitis again. He is insensate the erythema is really very brilliant.o Erysipelas He went to see an allergist days ago because he was itching part of this he had lab work done. This showed [name] white count of 15.1 with 70% neutrophils. Hemoglobin of 11.4 and [name] platelet count of 659,000. Last white count we had in Epic was [name] 2-1/2 years ago which was 25.9 but he was ill at the time. He was able to show me some lab work that was done by his primary physician the pattern is about the same. 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 But prompted me to go ahead and do x-rays of both feet and the pelvis rule out osteomyelitis. He also had [name] comprehensive metabolic panel this was reasonably normal his albumin was 3.7 liver function tests BUN/creatinine all normal 05/23/18; x-rays of both his feet from last week were negative for underlying pulmonary abnormality. The x-ray of his pelvis however showed mild irregularity in the left ischial which may represent some early osteomyelitis. The wound in the left ischial continues to get deeper clearly now exposed muscle. Each week necrotic  surface material over this area. Whereas the rest of the wounds do not look so bad. The left ischial wound we have been using Santyl and calcium alginate T the left heel surface necrotic debris using Iodoflex o The left lateral leg is still healed Areas on the left dorsal foot and the right dorsal foot are about the same. There is some inflammation on the left which might represent contact dermatitis, fungal dermatitis I am  doubtful cellulitis although this looks better than last week 05/30/18; CT scan done at Hospital did not show any osteomyelitis or abscess. Suggested the possibility of underlying cellulitis although I don't see [name] lot of evidence of this at the bedside The wound itself on the left buttock/upper thigh actually looks somewhat better. No debridement Left heel also looks better no debridement continue Iodoflex Both dorsal first second toe spaces appear better using Lotrisone. Left still required debridement 06/06/18; Intake reported some purulent looking drainage from the left gluteal wound. Using Santyl and calcium alginate Left heel looks better although still [name] nonviable surface requiring debridement The left dorsal foot first/second webspace actually expanding and somewhat deeper. I may consider doing [name] shave biopsy of this area Right dorsal foot first/second webspace appears stable to improved. Using Lotrisone and silver alginate to both these areas 06/13/18 Left gluteal surface looks better. Now separated in the 2 wounds. No debridement required. Still drainage. We'll continue silver alginate Left heel continues to look better with Iodoflex continue this for at least another week Of his dorsal foot wounds the area on the left still has some depth although it looks better than last week. We've been using Lotrisone and silver alginate 06/20/18 Left gluteal continues to look better healthy tissue Left heel continues to look better healthy granulation wound is smaller. He is using Iodoflex and his long as this continues continue the Iodoflex Dorsal right foot looks better unfortunately dorsal left foot does not. There is swelling and erythema of his forefoot. He had minor trauma to this several days ago but doesn't think this was enough to have caused any tissue injury. Foot looks like cellulitis, we have had this problem before 06/27/18 on evaluation today patient appears to be doing [name] little worse in  regard to his foot ulcer. 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 Both of which would really be his only options for treating this infection. In the past he has been given and effusion of Orbactiv. This is done very well for him in the past again it's one time dosing IV antibiotic therapy. Subsequently I do believe this is something we're gonna need to see about doing at this point in time. Currently his other wounds seem to be doing somewhat better in my pinion I'[name] pretty happy in that regard. 07/03/18 on evaluation today patient's wounds actually appear to be doing fairly well. He has been tolerating the dressing changes without complication. All in all he seems to be showing signs of improvement. In regard to the antibiotics he has been dealing with infectious disease since I saw him last week as far as getting this scheduled. In the end [facility]'s [facility] this coming Friday. In the meantime he has been continuing to perform the dressing changes in such as previous. There does not appear to be any  evidence of infection worsengin at this time. 07/10/18; Since I last saw this man 2 weeks ago things have actually improved. IV antibiotics of resulted in less forefoot erythema although there is still some present. He is not systemically unwell Left buttock wounds 2 now have no depth there is increased epithelialization Using silver alginate Left heel still requires debridement using Iodoflex Left dorsal foot still with [name] sizable wound about the size of [name] border but healthy granulation Right dorsal foot still with [name] slitlike area using silver alginate 07/18/18; the patient's cellulitis in the left foot is improved in fact I think it is on its way to resolving. Left buttock wounds 2 both look better although  the larger one has hypertension granulation we've been using silver alginate Left heel has some thick circumferential redundant skin over the wound edge which will need to be removed today we've been using Iodoflex Left dorsal foot is still [name] sizable wound required debridement using silver alginate The right dorsal foot is just about closed only [name] small open area remains here 07/25/18; left foot cellulitis is resolved Left buttock wounds 2 both look better. Hyper-granulation on the major area Left heel as some debris over the surface but otherwise looks [name] healthier wound. Using silver collagen Right dorsal foot is just about closed 07/31/18; arrives with our intake nurse worried about purulent drainage from the buttock. We had hyper-granulation here last week His buttock wounds 2 continue to look better Left heel some debris over the surface but measuring smaller. Right dorsal foot unfortunately has openings between the toes Left foot superficial wound looks less aggravated. 08/07/18 Buttock wounds continue to look better although some of her granulation and the larger medial wound. silver alginate Left heel continues to look [name] lot better.silver collagen Left foot superficial wound looks less stable. Requires debridement. He has [name] new wound superficial area on the foot on the lateral dorsal foot. Right foot looks better using silver alginate without Lotrisone 08/14/2018; patient was in the ER last week diagnosed with [name] UTI. He is now on Cefpodoxime and Macrodantin. Buttock wounds continued to be smaller. Using silver alginate Left heel continues to look better using silver collagen Left foot superficial wound looks as though it is improving Right dorsal foot area is just about healed. 08/21/2018; patient is completed his antibiotics for his UTI. He has 2 open areas on the buttocks. There is still not closed although the surface looks satisfactory. Using silver alginate Left heel continues to  improve using silver collagen The bilateral dorsal foot areas which are at the base of his first and second toes/possible tinea pedis are actually stable on the left but worse on the right. The area on the left required debridement of necrotic surface. After debridement I obtained [name] specimen for PCR culture. The right dorsal foot which is been just about healed last week is now reopened 08/28/2018; culture done on the left dorsal foot showed coag negative staph both staph epidermidis and Lugdunensis. I think this is worthwhile initiating systemic treatment. I will use doxycycline given his long list of allergies. The area on the left heel slightly improved but still requiring debridement. The large wound on the buttock is just about closed whereas the smaller one is larger. Using silver alginate in this area 09/04/2018; patient is completing his doxycycline for the left foot although this continues to be [name] very difficult wound area with very adherent necrotic debris. We are using silver alginate to all his wounds right foot  left foot and the small wounds on his buttock, silver collagen on the left heel. 09/11/2018; once again this patient has intense erythema and swelling of the left forefoot. Lesser degrees of erythema in the right foot. He has [name] long list of allergies and intolerances. I will reinstitute doxycycline. 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 Using silver alginate Left heel also looks better using silver collagen Unfortunately both the areas on his feet look worse. The area on the left first second webspace is now gone through to the plantar part of his foot. The area on the left foot anteriorly is irritated with erythema and swelling in the forefoot. 09/25/2018 His wound on the left plantar heel looks better. Using silver collagen The area on the left buttock 2 small remnant areas. One is closed one is still open. Using silver  alginate The areas between both his first and second toes look worse. This in spite of long-standing antifungal therapy with ketoconazole and silver alginate which should have antifungal activity He has small areas around his original wound on the left calf one is on the bottom of the original scar tissue and one superiorly both of these are small and superficial but again given wound history in this site this is worrisome 10/02/2018 Left plantar heel continues to gradually contract using silver collagen Left buttock wound is unchanged using silver alginate The areas on his dorsal feet between his first and second toes bilaterally look about the same. I prescribed clindamycin ointment to see if we can address chronic staph colonization and also the underlying possibility of erythrasma The left lateral lower extremity wound is actually on the lateral part of his ankle. Small open area here. We have been using silver alginate 10/09/2018; Left plantar heel continues to look healthy and contract. No debridement is required Left buttock slightly smaller with [name] tape injury wound just below which was new this week Dorsal feet somewhat improved I have been using clindamycin Left lateral looks lower extremity the actual open area looks worse although [name] lot of this is epithelialized. I am going to change to silver collagen today He has [name] lot more swelling in the right leg although this is not pitting not red and not particularly warm there is [name] lot of spasm in the right leg usually indicative of people with paralysis of some underlying discomfort. We have reviewed his vascular status from 2017 he had [name] left greater saphenous vein ablation. I wonder about referring him back to vascular surgery if the area on the left leg continues to deteriorate. 10/16/2018 in today for follow-up and management of multiple lower extremity ulcers. His left Buttock wound is much lower smaller and almost closed completely.  The wound to the left ankle has began to reopen with Epithelialization and some adherent slough. He has multiple new areas to the left foot and leg. The left dorsal foot without much improvement. Wound present between left great webspace and 2nd toe. Erythema and edema present right leg. Right LE ultrasound obtained on 10/10/18 was negative for DVT . 10/23/2018; Left buttock is closed over. Still dry macerated skin but there is no open wound. I suspect this is chronic pressure/moisture Left lateral calf is quite [name] bit worse than when I saw this last. There is clearly drainage here he has macerated skin into the left plantar heel. We will change the primary dressing to alginate Left dorsal foot has some improvement  in overall wound area. Still using clindamycin and silver alginate Right dorsal foot about the same as the left using clindamycin and silver alginate The erythema in the right leg has resolved. He is DVT rule out was negative Left heel pressure area required debridement although the wound is smaller and the surface is health 10/26/2018 The patient came back in for his nurse check today predominantly because of the drainage coming out of the left lateral leg with [name] recent reopening of his original wound on the left lateral calf. He comes in today with [name] large amount of surrounding erythema around the wound extending from the calf into the ankle and even in the area on the dorsal foot. He is not systemically unwell. He is not febrile. Nevertheless this looks like cellulitis. We have been using silver alginate to the area. I changed him to [name] regular visit and I am going to prescribe him doxycycline. The rationale here is [name] long list of medication intolerances and [name] history of MRSA. I did not see anything that I thought would provide [name] valuable culture 10/30/2018 Follow-up from his appointment 4 days ago with really an extensive area of cellulitis in the left calf left lateral ankle and left  dorsal foot. I put him on doxycycline. He has [name] long list of medication allergies which are true allergy reactions. 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 In any case he is [name] lot better today. The erythema especially in the anterior and lateral left calf is better. He still has left ankle erythema. He also is complaining about increasing edema in the right leg we have only been using Kerlix Coban and he has been doing the wraps at home. Finally he has [name] spotty rash on the medial part of his upper left calf which looks like folliculitis or perhaps wrap occlusion type injury. Small superficial macules not pustules 11/06/18 patient arrives today with again [name] considerable degree of erythema around the wound on the left lateral calf extending into the dorsal ankle and dorsal foot. This is [name] lot worse than when I saw this last week. He is on doxycycline really with not [name] lot of improvement. He has not been systemically unwell Wounds on the; left heel actually looks improved. Original area on the left foot and proximity to the first and second toes looks about the same. He has superficial areas on the dorsal foot, anterior calf and then the reopening of his original wound on the left lateral calf which looks about the same The only area he has on the right is the dorsal webspace first and second which is smaller. He has [name] large area of dry erythematous skin on the left buttock small open area here. 11/13/2018; the patient arrives in much better condition. The erythema around the wound on the left lateral calf is [name] lot better. Not sure whether this was the clindamycin or the TCA and ketoconazole or just in the improvement in edema control [stasis dermatitis]. In any case this is [name] lot better. The area on the left heel is very small and just about resolved using silver collagen we have been using silver alginate to the areas on his dorsal  feet 11/20/2018; his wounds include the left lateral calf, left heel, dorsal aspects of both feet just proximal to the first second webspace. He is stable to slightly improved. I did not think any changes to his dressings were going  to be necessary 11/27/2018 he has [name] reopening on the left buttock which is surrounded by what looks like tinea or perhaps some other form of dermatitis. The area on the left dorsal foot has some erythema around it I have marked this area but I am not sure whether this is cellulitis or not. Left heel is not closed. Left calf the reopening is really slightly longer and probably worse 1/13; in general things look better and smaller except for the left dorsal foot. Area on the left heel is just about closed, left buttock looks better only [name] small wound remains in the skin looks better [using Lotrisone] 1/20; the area on the left heel only has [name] few remaining open areas here. Left lateral calf about the same in terms of size, left dorsal foot slightly larger right lateral foot still not closed. The area on the left buttock has no open wound and the surrounding skin looks [name] lot better 1/27; the area on the left heel is closed. Left lateral calf better but still requiring extensive debridements. The area on his left buttock is closed. He still has the open areas on the left dorsal foot which is slightly smaller in the right foot which is slightly expanded. We have been using Iodoflex on these areas as well 2/3; left heel is closed. Left lateral calf still requiring debridement using Iodoflex there is no open area on his left buttock however he has dry scaly skin over [name] large area of this. Not really responding well to the Lotrisone. Finally the areas on his dorsal feet at the level of the first second webspace are slightly smaller on the right and about the same on the left. Both of these vigorously debrided with Anasept and gauze 2/10; left heel remains closed he has dry  erythematous skin over the left buttock but there is no open wound here. Left lateral leg has come in and with. Still requiring debridement we have been using Iodoflex here. Finally the area on the left dorsal foot and right dorsal foot are really about the same extremely dry callused fissured areas. He does not yet have [name] dermatology appointment 2/17; left heel remains closed. He has [name] new open area on the left buttock. The area on the left lateral calf is bigger longer and still covered in necrotic debris. No major change in his foot areas bilaterally. I am awaiting for [name] dermatologist to look on this. We have been using ketoconazole I do not know that this is been doing any good at all. 2/24; left heel remains closed. The left buttock wound that was new reopening last week looks better. The left lateral calf appears better also although still requires debridement. The major area on his foot is the left first second also requiring debridement. We have been putting Prisma on all wounds. I do not believe that the ketoconazole has done too much good for his feet. He will use Lotrisone I am going to give him [name] 2-week course of terbinafine. We still do not have [name] dermatology appointment 3/2 left heel remains closed however there is skin over bone in this area I pointed this out to him today. The left buttock wound is epithelialized but still does not look completely stable. The area on the left leg required debridement were using silver collagen here. With regards to his feet we changed to Lotrisone last week and silver alginate. 3/9; left heel remains closed. Left buttock remains closed. The area on the right  foot is essentially closed. The left foot remains unchanged. Slightly smaller on the left lateral calf. Using silver collagen to both of these areas 3/16-Left heel remains closed. Area on right foot is closed. Left lateral calf above the lateral malleolus open wound requiring debridement with easy  bleeding. Left dorsal wound proximal to first toe also debrided. Left ischial area open new. Patient has been using Prisma with wrapping every 3 days. Dermatology appointment is apparently tomorrow.Patient has completed his terbinafine 2-week course with some apparent improvement according to him, there is still flaking and dry skin in his foot on the left 3/23; area on the right foot is reopened. The area on the left anterior foot is about the same still [name] very necrotic adherent surface. He still has the area on the left leg and reopening is on the left buttock. He apparently saw dermatology although I do not have [name] note. According to the patient who is usually fairly well informed they did not have any good ideas. Put him on oral terbinafine which she is been on before. 3/30; using silver collagen to all wounds. Apparently his dermatologist put him on doxycycline and rifampin presumably some culture grew staph. I do not have this result. He remains on terbinafine although I have used terbinafine on him before 4/6; patient has had [name] fairly substantial reopening on the right foot between the first and second toes. He is finished his terbinafine and I believe is on doxycycline and rifampin still as prescribed by dermatology. We have been using silver collagen to all his wounds although the patient reports that he thinks silver alginate does better on the wounds on his buttock. 4/13; the area on his left lateral calf about the same size but it did not require debridement. Left dorsal foot just proximal to the webspace between the first and second toes is about the same. Still nonviable surface. I note some superficial bronze discoloration of the dorsal part of his foot Right dorsal foot just proximal to the first and second toes also looks about the same. I still think there may be the same discoloration I noted above on the left Left buttock wound looks about the same 4/20; left lateral calf  appears to be gradually contracting using silver collagen. 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 The left dorsal foot wound is debrided of tightly adherent necrotic debris and really cleans up quite nicely. The right area is worse with expansion. I did not debride this it is now over the base of the second toe The area on his left buttock is smaller no debridement is required using silver collagen 5/4; left calf continues to make good progress. He arrives with erythema around the wounds on his dorsal foot which even extends to the plantar aspect. Very concerning for coexistent infection. He is finished the erythromycin I gave him for possible erythrasma this does not seem to have helped. The area on the left foot is about the same base of the dorsal toes Is area on the buttock looks improved on the left 5/11; left calf and left buttock continued to make good progress. Left foot is about the same to slightly improved. Major problem is on the right foot. He has not had an x-ray. Deep tissue culture I did last week showed both Enterobacter and E. coli. I did not change the doxycycline I put him on empirically although neither 1 of these were plated to doxycycline. He arrives today  with the erythema looking worse on both the dorsal and plantar foot. Macerated skin on the bottom of the foot. he has not been systemically unwell 5/18-Patient returns at 1 week, left calf wound appears to be making some progress, left buttock wound appears slightly worse than last time, left foot wound looks slightly better, right foot redness is marginally better. X-ray of both feet show no air or evidence of osteomyelitis. Patient is finished his Omnicef and terbinafine. He continues to have macerated skin on the bottom of the left foot as well as right 5/26; left calf wound is better, left buttock wound appears to have multiple small superficial open areas with  surrounding macerated skin. X-rays that I did last time showed no evidence of osteomyelitis in either foot. He is finished cefdinir and doxycycline. I do not think that he was on terbinafine. He continues to have [name] large superficial open area on the right foot anterior dorsal and slightly between the first and second toes. I did send him to dermatology 2 months ago or so wondering about whether they would do [name] fungal scraping. I do not believe they did but did do [name] culture. We have been using silver alginate to the toe areas, he has been using antifungals at home topically either ketoconazole or Lotrisone. We are using silver collagen on the left foot, silver alginate on the right, silver collagen on the left lateral leg and silver alginate on the left buttock 6/1; left buttock area is healed. We have the left dorsal foot, left lateral leg and right dorsal foot. We are using silver alginate to the areas on both feet and silver collagen to the area on his left lateral calf 6/8; the left buttock apparently reopened late last week. He is not really sure how this happened. He is tolerating the terbinafine. Using silver alginate to all wounds 6/15; left buttock wound is larger than last week but still superficial. Came in the clinic today with [name] report of purulence from the left lateral leg I did not identify any infection Both areas on his dorsal feet appear to be better. He is tolerating the terbinafine. Using silver alginate to all wounds 6/22; left buttock is about the same this week, left calf quite [name] bit better. His left foot is about the same however he comes in with erythema and warmth in the right forefoot once again. Culture that I gave him in the beginning of May showed Enterobacter and E. coli. I gave him doxycycline and things seem to improve although neither 1 of these organisms was specifically plated. 6/29; left buttock is larger and dry this week. Left lateral calf looks to me to be  improved. Left dorsal foot also somewhat improved right foot completely unchanged. The erythema on the right foot is still present. He is completing the Ceftin dinner that I gave him empirically [see discussion above.) 7/6 - All wounds look to be stable and perhaps improved, the left buttock wound is slightly smaller, per patient bleeds easily, completed ceftin, the right foot redness is less, he is on terbinafine 7/13; left buttock wound about the same perhaps slightly narrower. Area on the left lateral leg continues to narrow. Left dorsal foot slightly smaller right foot about the same. We are using silver alginate on the right foot and Hydrofera Blue to the areas on the left. Unna boot on the left 2 layer compression on the right 7/20; left buttock wound absolutely the same. Area on lateral leg continues to  get better. Left dorsal foot require debridement as did the right no major change in the 7/27; left buttock wound the same size necrotic debris over the surface. The area on the lateral leg is closed once again. His left foot looks better right foot about the same although there is some involvement now of the posterior first second toe area. He is still on terbinafine which I have given him for [name] month, not certain [name] centimeter major change 06/25/19-All wounds appear to be slightly improved according to report, left buttock wound looks clean, both foot wounds have minimal to no debris the right dorsal foot has minimal slough. We are using Hydrofera Blue to the left and silver alginate to the right foot and ischial wound. 8/10-Wounds all appear to be around the same, the right forefoot distal part has some redness which was not there before, however the wound looks clean and small. Ischial wound looks about the same with no changes 8/17; his wound on the left lateral calf which was his original chronic venous insufficiency wound remains closed. Since I last saw him the areas on the left dorsal  foot right dorsal foot generally appear better but require debridement. The area on his left initial tuberosity appears somewhat larger to me perhaps hyper granulated and bleeds very easily. We have been using Hydrofera Blue to the left dorsal foot and silver alginate to everything else 8/24; left lateral calf remains closed. The areas on his dorsal feet on the webspace of the first and second toes bilaterally both look better. The area on the left buttock which is the pressure ulcer stage II slightly smaller. I change the dressing to Hydrofera Blue to all areas 8/31; left lateral calf remains closed. The area on his dorsal feet bilaterally look better. Using Hydrofera Blue. Still requiring debridement on the left foot. No change in the left buttock pressure ulcers however 9/14; left lateral calf remains closed. Dorsal feet look quite [name] bit better than 2 weeks ago. Flaking dry skin also [name] lot better with the ammonium lactate I gave him 2 weeks ago. The area on the left buttock is improved. He states that his Roho cushion developed [name] leak and he is getting [name] new one, in the interim he is offloading this vigorously 9/21; left calf remains closed. Left heel which was [name] possible DTI looks better this week. He had macerated tissue around the left dorsal foot right foot looks satisfactory and improved left buttock wound. I changed his dressings to his feet to silver alginate bilaterally. Continuing Hydrofera Blue on the left buttock. 9/28 left calf remains closed. Left heel did not develop anything [possible DTI] dry flaking skin on the left dorsal foot. Right foot looks satisfactory. Improved left buttock wound. We are using silver alginate on his feet Hydrofera Blue on the buttock. I have asked him to go back to the Lotrisone on his feet including the wounds and surrounding areas 10/5; left calf remains closed. The areas on the left and right feet about the same. [name] lot of this is epithelialized however  debris over the remaining open areas. He is using Lotrisone and silver alginate. The area on the left buttock using Hydrofera Blue 10/26. Patient has been out for 3 weeks secondary to Covid concerns. He tested negative but I think his wife tested positive. He comes in today with the left foot substantially worse, right foot about the same. Even more concerning he states that the area on his left buttock closed  over but then reopened and is considerably deeper in one aspect than it was before [stage III wound] 11/2; left foot really about the same as last week. Quarter sized wound on the dorsal foot just proximal to the first second toes. Surrounding erythema with areas of denuded epithelium. This is not really much different looking. Did not look like cellulitis this time however. Right foot area about the same.. We have been using silver alginate alginate on his toes Left buttock still substantial irritated skin around the wound which I think looks somewhat better. We have been using Hydrofera Blue here. 11/9; left foot larger than last week and [name] very necrotic surface. Right foot I think is about the same perhaps slightly smaller. Debris around the circumference also addressed. Unfortunately on the left buttock there is been [name] decline. Satellite lesions below the major wound distally and now [name] an additional one posteriorly we have been using Hydrofera Blue but I think this is [name] pressure issue 11/16; left foot ulcer dorsally again [name] very adherent necrotic surface. Right foot is about the same. Not much change in the pressure ulcer on his left buttock. 11/30; left foot ulcer dorsally basically the same as when I saw him 2 weeks ago. Very adherent fibrinous debris on the wound surface. Patient reports [name] lot of drainage as well. 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 We have been using Iodoflex, patient changed back to alginate because of the  drainage. Area on his right dorsal foot really looks benign with [name] healthier surface certainly [name] lot better than on the left. Left buttock wounds all improved using Hydrofera Blue 12/7; left dorsal foot again no improvement. Tightly adherent debris. PCR culture I did last week only showed likely skin contaminant. I have gone ahead and done [name] punch biopsy of this which is about the last thing in terms of investigations I can think to do. He has known venous insufficiency and venous hypertension and this could be the issue here. The area on the right foot is about the same left buttock slightly worse according to our intake nurse secondary to [facility] Blue sticking to the wound 12/14; biopsy of the left foot that I did last time showed changes that could be related to wound healing/chronic stasis dermatitis phenomenon no neoplasm. We have been using silver alginate to both feet. I change the one on the left today to Sorbact and silver alginate to his other 2 wounds 12/28; the patient arrives with the following problems; Major issue is the dorsal left foot which continues to be [name] larger deeper wound area. Still with [name] completely nonviable surface Paradoxically the area mirror image on the right on the right dorsal foot appears to be getting better. He had some loss of dry denuded skin from the lower part of his original wound on the left lateral calf. Some of this area looked [name] little vulnerable and for this reason we put him in wrap that on this side this week The area on his left buttock is larger. He still has the erythematous circular area which I think is [name] combination of pressure, sweat. This does not look like cellulitis or fungal dermatitis 11/26/2019; -Dorsal left foot large open wound with depth. Still debris over the surface. Using Sorbact The area on the dorsal right foot paradoxically has closed over He has [name] reopening on the left ankle laterally at the base of his original wound that  extended up  into the calf. This appears clean. The left buttock wound is smaller but with very adherent necrotic debris over the surface. We have been using silver alginate here as well The patient had arterial studies done in 2017. He had biphasic waveforms at the dorsalis pedis and posterior tibial bilaterally. ABI in the left was 1.17. Digit waveforms were dampened. He has slight spasticity in the great toes I do not think [name] TBI would be possible 1/11; the patient comes in today with [name] sizable reopening between the first and second toes on the right. This is not exactly in the same location where we have been treating wounds previously. According to our intake nurse this was actually fairly deep but 0.6 cm. The area on the left dorsal foot looks about the same the surface is somewhat cleaner using Sorbact, his MRI is in 2 days. We have not managed yet to get arterial studies. The new reopening on the left lateral calf looks somewhat better using alginate. The left buttock wound is about the same using alginate 1/18; the patient had his ARTERIAL studies which were quite normal. ABI in the right at 1.13 with triphasic/biphasic waveforms on the left ABI 1.06 again with triphasic/biphasic waveforms. It would not have been possible to have done [name] toe brachial index because of spasticity. We have been using Sorbac to the left foot alginate to the rest of his wounds on the right foot left lateral calf and left buttock 1/25; arrives in clinic with erythema and swelling of the left forefoot worse over the first MTP area. This extends laterally dorsally and but also posteriorly. Still has an area on the left lateral part of the lower part of his calf wound it is eschared and clearly not closed. Area on the left buttock still with surrounding irritation and erythema. Right foot surface wound dorsally. The area between the right and first and second toes appears better. 2/1; The left foot wound is about the  same. Erythema slightly better I gave him [name] week of doxycycline empirically Right foot wound is more extensive extending between the toes to the plantar surface Left lateral calf really no open surface on the inferior part of his original wound however the entire area still looks vulnerable Absolutely no improvement in the left buttock wound required debridement. 2/8; the left foot is about the same. Erythema is slightly improved I gave him clindamycin last week. Right foot looks better he is using Lotrimin and silver alginate He has [name] breakdown in the left lateral calf. Denuded epithelium which I have removed Left buttock about the same were using Hydrofera Blue 2/15; left foot is about the same there is less surrounding erythema. Surface still has tightly adherent debris which I have debriding however not making any progress Right foot has [name] substantial wound on the medial right second toe between the first and second webspace. Still an open area on the left lateral calf distal area. Buttock wound is about the same 2/22; left foot is about the same less surrounding erythema. Surface has adherent debris. Polymen Ag Right foot area significant wound between the first and second toes. We have been using silver alginate here Left lateral leg polymen Ag at the base of his original venous insufficiency wound Left buttock some improvement here 3/1; Right foot is deteriorating in the first second toe webspace. Larger and more substantial. We have been using silver alginate. Left dorsal foot about the same markedly adherent surface debris using PolyMem Ag Left lateral  calf surface debris using PolyMem AG Left buttock is improved again using PolyMem Ag. He is completing his terbinafine. The erythema in the foot seems better. He has been on this for 2 weeks 3/8; no improvement in any wound area in fact he has [name] small open area on the dorsal midfoot which is new this week. He has not gotten his foot  x-rays yet 3/15; his x-rays were both negative for osteomyelitis of both feet. No major change in any of his wounds on the extremities however his buttock wounds are better. We have been using polymen on the buttocks, left lower leg. Iodoflex on the left foot and silver alginate on the right 3/22; arrives in clinic today with the 2 major issues are the improvement in the left dorsal foot wound which for once actually looks healthy with [name] nice healthy wound surface without debridement. Using Iodoflex here. Unfortunately on the left lateral calf which is in the distal part of his original wound he came to the clinic here for there was purulent drainage noted some increased breakdown scattered around the original area and [name] small area proximally. We we are using polymen here will change to silver alginate today. His buttock wound on the left is better and I think the area on the right first second toe webspace is also improved 3/29; left dorsal foot looks better. Using Iodoflex. Left ankle culture from deterioration last time grew E. coli, Enterobacter and Enterococcus. I will give him [name] course of cefdinir although that will not cover Enterococcus. The area on the right foot in the webspace of the first and second toe lateral first toe looks better. The area on his buttock is about healed Vascular appointment is on April 21. This is to look at his venous system vis--vis continued breakdown of the wounds on the left including the left lateral leg and left dorsal foot he. He has had previous ablations on this side 4/5; the area between the right first and second toes lateral aspect of the first toe looks better. Dorsal aspect of the left first toe on the left foot also improved. Unfortunately the left lateral lower leg is larger and there is [name] second satellite wound superiorly. The usual superficial abrasions on the left buttock overall better but certainly not closed 4/12; the area between the right  first and second toes is improved. Dorsal aspect of the left foot also slightly smaller with [name] vibrant healthy looking surface. No real change in the left lateral leg and the left buttock wound is healed He has an unaffordable co-pay for Apligraf. Appointment with vein and vascular with regards to the left leg venous part of the circulation is on 4/21 4/19; we continue to see improvement in all wound areas. Although this is minor. He has his vascular appointment on 4/21. The area on the left buttock has not reopened although right in the center of this area the skin looks somewhat threatened 4/26; the left buttock is unfortunately reopened. In general his left dorsal foot has [name] healthy surface and looks somewhat smaller although it was not measured as such. The area between his first and second toe webspace on the right as [name] small wound against the first toe. The patient saw vascular surgery. The real question I was asking was about the small saphenous vein on the left. He has previously ablated left greater saphenous vein. Nothing further was commented on on the left. Right greater saphenous vein without reflux at the saphenofemoral junction  or proximal thigh there was no indication for ablation of the right greater saphenous vein duplex was negative for DVT bilaterally. They did not think there was anything from [name] point of view that could be offered. They ABIs within normal limits 5/3; only small open area on the left buttock. The area on the left lateral leg which was his original venous reflux is now 2 wounds both which look clean. We are using Iodoflex on the left dorsal foot which looks healthy and smaller. He is down to [name] very tiny area between the right first and second toes, using silver alginate 5/10; all of his wounds appear better. We have much better edema control in 4 layer compression on the left. This may be the factor that is allowing the left foot and left lateral  calf to heal. He has external compression garments at home 04/14/20-All of his wounds are progressing well, the left forefoot is practically closed, left ischium appears to be about the same, right toe webspace is also smaller. The left lateral leg is about the same, continue using Hydrofera Blue to this, silver alginate to the ischium, Iodoflex to the toe space on the right 6/7; most of his wounds outside of the left buttock are doing well. The area on the left lateral calf and left dorsal foot are smaller. The area on the right foot in between the first and second toe webspace is barely visible although he still says there is some drainage here is the only reason I did not heal this out. Unfortunately the area on the left buttock almost looks like he has [name] skin tear from tape. He has open wound and then [name] large flap of skin that we are trying to get adherence over an area just next to the remaining wound 6/21; 2 week follow-up. I believe is been here for nurse visits. Miraculously the area between his first and second toes on the left dorsal foot is closed over. Still open on the right first second web space. The left lateral calf has 2 open areas. Distally this is more superficial. The proximal area had [name] little more depth and required debridement of adherent necrotic material. His buttock wound is actually larger we have been using silver alginate here 6/28; the patient's area on the left foot remains closed. Still open wet area between the first and second toes on the right and also extending into the plantar aspect. We have been using silver alginate in this location. He has 2 areas on the left lower leg part of his original long wounds which I think are better. We have been using Hydrofera Blue here. Hydrofera Blue to the left buttock which is stable 7/12; left foot remains closed. Left ankle is closed. May be [name] small area between his right first and second toes the only truly open area is on the  left buttock. We have been using Hydrofera Blue here 7/19; patient arrives with marked deterioration especially in the left foot and ankle. We did not put him in [name] compression wrap on the left last week in fact he wore his juxta lite stockings on either side although he does not have an underlying stocking. He has [name] reopening on the left dorsal foot, left lateral ankle and [name] new area on the right dorsal ankle. More worrisome is the degree of erythema on the left foot extending on the lateral foot into the lateral lower leg on the left Electronic Signature(s) Signed: 06/10/2020 6:00:01  PM By: Linton Ham MD Entered By: Linton Ham on 06/09/2020 08:51:24 -------------------------------------------------------------------------------- Physical Exam Details Patient Name: Date of Service: [name] [name] [name] 06/09/2020 8:00 [name] [name] Medical Record Number: [id number] Patient Account Number: [id number] Date of Birth/Sex: Treating RN: 17-May-1986 (34 y.o. [name] Primary Care Provider: [place], [place] Other Clinician: Referring Provider: Treating Provider/Extender: [name]: 231 Constitutional Sitting or standing Blood Pressure is within target range for patient.. Patient is tachycardic. Respirations regular, non-labored and within target range.. Temperature is normal and within the target range for the patient.Marland Kitchen Appears in no distress. Integumentary (Hair, Skin) Extensive degrees of erythema on the left forefoot dorsally extending laterally to the lateral ankle and lower leg. Notes Wound exam Left ischial tuberosity wound looks about the same. We have been using Hydrofera Blue the wound itself looks satisfactory. Same degree of erythema Left lateral leg eschar subcu removed from the lateral leg with [name] #5 curette this is clearly open underneath. Culture obtained New opening on the left dorsal foot I cannot see anything open between the right first and  second toe but he has [name] superficial area on the right dorsal ankle. Electronic Signature(s) Signed: 06/10/2020 6:00:01 PM By: Linton Ham MD Entered By: Linton Ham on 06/09/2020 08:53:39 -------------------------------------------------------------------------------- Physician Orders Details Patient Name: Date of Service: [name] [name] [name] 06/09/2020 8:00 [name] [name] Medical Record Number: [id number] Patient Account Number: [id number] Date of Birth/Sex: Treating RN: 28-Apr-1986 (34 y.o. 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 Dressing Change Frequency Wound #37R Left,Lateral Malleolus Do not change entire dressing for one week. Wound #38 Right T - Web between 1st and 2nd oe Change Dressing every other day. Wound #41 Left Ischium Change Dressing every other day. Wound #42 Right,Dorsal Ankle Change Dressing every other day. Wound #43 Left,Dorsal Foot Do not change entire dressing for one week. Skin Barriers/Peri-Wound Care Moisturizing lotion - both legs Primary Wound Dressing Wound #37R Left,Lateral Malleolus Calcium Alginate with Silver Wound #38 Right T - Web between 1st and 2nd oe Calcium Alginate with Silver Wound #41 Left Ischium Hydrofera Blue Wound #42 Right,Dorsal Ankle Calcium Alginate with Silver Wound #43 Left,Dorsal Foot Calcium Alginate with Silver Secondary  Dressing Wound #37R Left,Lateral Malleolus Dry Gauze Wound #38 Right T - Web between 1st and 2nd oe Kerlix/Rolled Gauze - secure with tape Dry Gauze Wound #41 Left Ischium Dry Gauze ABD pad Wound #42 Right,Dorsal Ankle Kerlix/Rolled Gauze - secure with tape Dry Gauze Wound #43 Left,Dorsal Foot Dry Gauze Edema Control 4 layer compression: Left lower extremity Elevate legs to the level of the heart or above for 30 minutes daily and/or when sitting, [name] frequency of: - throughout the day Support Garment 30-40 mm/Hg pressure to: - Juxtalite to both legs daily Off-Loading Low air-loss mattress (Group 2) Roho cushion for wheelchair Turn and reposition every 2 hours - out of wheelchair throughout the day, try to lay on sides, sleep in the bed not the recliner Laboratory naerobe culture (MICRO) - Left  lateral ankle - (ICD10 L97.321 - Non-pressure chronic ulcer of Bacteria identified in Unspecified specimen by [name] left ankle limited to breakdown of skin) LOINC Code: 916-3 Convenience Name: Anerobic culture Patient Medications llergies: penicillin, sulfa drugs, Levaquin, meropenem, Zyvox [name] Notifications Medication Indication Start End cellluitis left foot 06/09/2020 doxycycline monohydrate DOSE oral 100 mg capsule - 1 capsule oral bid for 7 days Electronic Signature(s) Signed: 06/09/2020 8:55:38 AM By: Linton Ham MD Entered By: Linton Ham on 06/09/2020 08:55:23 -------------------------------------------------------------------------------- Problem List Details Patient Name: Date of Service: [name] [name] [name] 06/09/2020 8:00 [name] [name] Medical Record Number: [id number] Patient Account Number: [id number] Date of Birth/Sex: Treating RN: 11-27-1985 (34 y.o. [name] Primary Care Provider: [place], [place] Other Clinician: Referring Provider: Treating Provider/Extender: [name]: 231 Active Problems ICD-10 Encounter Code Description  Active Date MDM Diagnosis I87.332 Chronic venous hypertension (idiopathic) with ulcer and inflammation of left 02/25/2020 No Yes lower extremity L97.321 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 to breakdown of skin 11/26/2019 No Yes L97.311 Non-pressure chronic ulcer of right ankle limited to breakdown of skin 06/09/2020 No Yes L97.511 Non-pressure chronic ulcer of other part of right foot limited to breakdown of 08/05/2016 No Yes skin L97.521 Non-pressure chronic ulcer of other part of left foot limited to breakdown of 07/25/2018 No Yes skin L03.116 Cellulitis of left lower limb 12/17/2019 No Yes 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, stage 3 09/17/2019 No Yes 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, complete 01/02/2016 No Yes Inactive Problems ICD-10 Code Description Active Date Inactive Date 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, stage 3 01/02/2016 01/02/2016 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, stage 3 12/05/2017 12/05/2017 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115 Cellulitis of right lower limb 04/20/2018 04/20/2018 L03.116 Cellulitis of left lower limb 05/16/2018 05/16/2018 L03.115 Cellulitis of right lower limb 04/02/2019 04/02/2019 Electronic Signature(s) Signed: 06/10/2020 6:00:01 PM By: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 08/04/17; continued improvement with Iodoflex to the left lateral leg wound in terms of the surface of the wound although the dimensions are better. 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 Problematic areas on the plantar aspect of the first second toe web space on the left and the dorsal aspect of the first second toe web space on the right. At one point I felt these were probably related to chronic fungal infections in treated him aggressively for this although we have not made any improvement here. 08/11/17; left lateral leg. Surface continues to improve with the Iodoflex although we are not seeing much improvement in overall wound dimensions. Areas on his plantar left foot and right foot show no improvement. In fact the right foot looks somewhat worse 08/18/17; left lateral leg. We changed to [facility] Blue last week after [name] prolonged course of Iodoflex which helps get the surface better. It appears that the wound with is improved. Continue with difficult areas on the left dorsal first second and plantar first second on the right 09/01/17; patient arrives in clinic today having had [name] temperature of 103 yesterday. He was seen in the ER and [facility]. The patient was concerned he could have cellulitis again in the right leg however they diagnosed him with [name] UTI and he is now on Keflex. He has [name] history of cellulitis which is been recurrent and difficult but this is been in the left leg, in the past 5 use doxycycline. He does in and out catheterizations at home which are risk factors for UTI 09/08/17; patient will be completing his Keflex this weekend. The erythema on  the left leg is considerably better. He has [name] new wound today on the medial part of the right leg small superficial almost looks like [name] skin tear. He has worsening of the area on the right dorsal first and second toe. His major area on the left lateral leg is better. Using Hydrofera Blue on all areas 09/15/17; gradual reduction in width on the long wound in the left lateral leg. No debridement required. He also has wounds on the plantar aspect of his left first second  toe web space and on the dorsal aspect of the right first second toe web space. 09/22/17; there continues to be very gradual improvements in the dimensions of the left lateral leg wound. He hasn't round erythematous spot with might be pressure on his wheelchair. There is no evidence obviously of infection no purulence no warmth ooHe has [name] dry scaled area on the plantar aspect of the left first second toe ooImproved area on the dorsal right first second toe. 09/29/17; left lateral leg wound continues to improve in dimensions mostly with an is still [name] fairly long but increasingly narrow wound. ooHe has [name] dry scaled area on the plantar aspect of his left first second toe web space ooIncreasingly concerning area on the dorsal right first second toe. In fact I am concerned today about possible cellulitis around this wound. The areas extending up his second toe and although there is deformities here almost appears to abut on the nailbed. 10/06/17; left lateral leg wound continues to make very gradual progress. Tissue culture I did from the right first second toe dorsal foot last time grew MRSA and enterococcus which was vancomycin sensitive. This was not sensitive to clindamycin or doxycycline. He is allergic to Zyvox and sulfa we have therefore arrange for him to have dalvance infusion tomorrow. He is had this in the past and tolerated it well 10/20/17; left lateral leg wound continues to make decent progress. This is certainly reduced in  terms of with there is advancing epithelialization.ooThe cellulitis in the right foot looks better although he still has [name] deep wound in the dorsal aspect of the first second toe web space. Plantar left first toe web space on the left I think is making some progress 10/27/17; left lateral leg wound continues to make decent progress. Advancing epithelialization.using Hydrofera Blue ooThe right first second toe web space wound is better-looking using silver alginate ooImprovement in the left plantar first second toe web space. Again using silver alginate 11/03/17 left lateral leg wound continues to make decent progress albeit slowly. Using Hydrofera Blue ooThe right per second toe web space continues to be [name] very problematic looking punched out wound. I obtained [name] piece of tissue for deep culture I did extensively treated this for fungus. It is difficult to imagine that this is [name] pressure area as the patient states other than going outside he doesn't really wear shoes at home ooThe left plantar first second toe web space looked fairly senescent. Necrotic edges. This required debridement oochange to Hydrofera Blue to all wound areas 11/10/17; left lateral leg wound continues to contract. Using Hydrofera Blue ooOn the right dorsal first second toe web space dorsally. Culture I did of this area last week grew MRSA there is not an easy oral option in this patient was multiple antibiotic allergies or intolerances. This was only [name] rare culture isolate I'[name] therefore going to use Bactroban under silver alginate ooOn the left plantar first second toe web space. Debridement is required here. This is also unchanged 11/17/17; left lateral leg wound continues to contract using Hydrofera Blue this is no longer the major issue. ooThe major concern here is the right first second toe web space. He now has an open area going from dorsally to the plantar aspect. There is now wound on the inner lateral part of  the first toe. Not [name] very viable surface on this. There is erythema spreading medially into the forefoot. ooNo major change in the left first second toe plantar wound 11/24/17; left  lateral leg wound continues to contract using Hydrofera Blue. Nice improvement today ooThe right first second toe web space all of this looks [name] lot less angry than last week. I have given him clindamycin and topical Bactroban for MRSA and terbinafine for the possibility of underlining tinea pedis that I could not control with ketoconazole. Looks somewhat better ooThe area on the plantar left first second toe web space is weeping with dried debris around the wound 12/01/17; left lateral leg wound continues to contract he Hydrofera Blue. It is becoming thinner in terms of with nevertheless it is making good improvement. ooThe right first second toe web space looks less angry but still [name] large necrotic-looking wounds starting on the plantar aspect of the right foot extending between the toes and now extensively on the base of the right second toe. I gave him clindamycin and topical Bactroban for MRSA anterior benefiting for the possibility of underlying tinea pedis. Not looking better today ooThe area on the left first/second toe looks better. Debrided of necrotic debris 12/05/17* the patient was worked in urgently today because over the weekend he found blood on his incontinence bad when he woke up. He was found to have an ulcer by his wife who does most of his wound care. He came in today for [place] to look at this. He has not had [name] history of wounds in his buttocks in spite of his paraplegia. 12/08/17; seen in follow-up today at his usual appointment. He was seen earlier this week and found to have [name] new wound on his buttock. We also follow him for wounds on the left lateral leg, left first second toe web space and right first second toe web space 12/15/17; we have been using Hydrofera Blue to the left lateral leg which has  improved. The right first second toe web space has also improved. Left first second toe web space plantar aspect looks stable. The left buttock has worsened using Santyl. Apparently the buttock has drainage 12/22/17; we have been using Hydrofera Blue to the left lateral leg which continues to improve now 2 small wounds separated by normal skin. He tells [place] he had [name] fever up to 100 yesterday he is prone to UTIs but has not noted anything different. He does in and out catheterizations. The area between the first and second toes today does not look good necrotic surface covered with what looks to be purulent drainage and erythema extending into the third toe. I had gotten this to something that I thought look better last time however it is not look good today. He also has [name] necrotic surface over the buttock wound which is expanded. I thought there might be infection under here so I removed [name] lot of the surface with [name] #5 curet though nothing look like it really needed culturing. He is been using Santyl to this area 12/27/17; his original wound on the left lateral leg continues to improve using Hydrofera Blue. I gave him samples of Baxdella although he was unable to take them out of fear for an allergic reaction ["lump in his throat"].the culture I did of the purulent drainage from his second toe last week showed both enterococcus and [name] set Enterobacter I was also concerned about the erythema on the bottom of his foot although paradoxically although this looks somewhat better today. 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 We've been using silver alginate here.  They came up today that he sleeps in [name] recliner, I'[name] not sure why but I asked him to stop this 01/03/18; his original wound we've been using Hydrofera Blue is now separated into 2 areas. ooUlcer on his left buttock is better he is off the recliner and  sleeping in bed ooFinally both wound areas between his first and second toes also looks some better 01/10/18; his original wound on the left lateral leg is now separated into 2 wounds we've been using Hydrofera Blue ooUlcer on his left buttock has some drainage. There is [name] small probing site going into muscle layer superiorly.using silver alginate -He arrives today with [name] deep tissue injury on the left heel ooThe wound on the dorsal aspect of his first second toe on the left looks [name] lot betterusing silver alginate ketoconazole ooThe area on the first second toe web space on the right also looks [name] lot bette 01/17/18; his original wound on the left lateral leg continues to progress using Hydrofera Blue ooUlcer on his left buttock also is smaller surface healthier except for [name] small probing site going into the muscle layer superiorly. 2.4 cm of tunneling in this area ooDTI on his left heel we have only been offloading. Looks better than last week no threatened open no evidence of infection oothe wound on the dorsal aspect of the first second toe on the left continues to look like it's regressing we have only been using silver alginate and terbinafine orally ooThe area in the first second toe web space on the right also looks to be [name] lot better using silver alginate and terbinafine I think this was prompted by tinea pedis 01/31/18; the patient was hospitalized in [place] last week apparently for [name] complicated UTI. He was discharged on cefepime he does in and out catheterizations. In the hospital he was discovered [name] I don't mild elevation of AST and ALT and the terbinafine was stopped.predictably the pressure ulcer on s his buttock looks betterusing silver alginate. The area on the left lateral leg also is better using Hydrofera Blue. The area between the first and second toes on the left better. First and second toes on the right still substantial but better. Finally the DTI on the left heel has  held together and looks like it's resolving 02/07/18-he is here in follow-up evaluation for multiple ulcerations. He has new injury to the lateral aspect of the last issue [name] pressure ulcer, he states this is from adhesive removal trauma. He states he has tried multiple adhesive products with no success. All other ulcers appear stable. The left heel DTI is resolving. We will continue with same treatment plan and follow-up next week. 02/14/18; follow-up for multiple areas. ooHe has [name] new area last week on the lateral aspect of his pressure ulcer more over the posterior trochanter. The original pressure ulcer looks quite stable has healthy granulation. 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 Almost fully epithelialized on the original superior area using Hydrofera Blue ooDTI on the left heel has peeled off this week to reveal [name] small superficial wound under denuded skin and subcutaneous tissue ooBoth areas between the first and second toes look better including nothing open on the left 02/21/18; ooThe patient's wounds on his left ischial tuberosity and posterior left greater trochanter actually looked better. He has [name] large area of irritation around the area which I think is contact dermatitis. I  am doubtful that this is fungal ooHis original wound on the left lateral calf continues to improve we have been using Hydrofera Blue ooThere is no open area in the left first second toe web space although there is [name] lot of thick callus ooThe DTI on the left heel required debridement today of necrotic surface eschar and subcutaneous tissue using silver alginate ooFinally the area on the right first second toe webspace continues to contract using silver alginate and ketoconazole 02/28/18 ooLeft ischial tuberosity wounds look better using silver alginate. ooOriginal wound on the left calf only has  one small open area left using Hydrofera Blue ooDTI on the left heel required debridement mostly removing skin from around this wound surface. Using silver alginate ooThe areas on the right first/second toe web space using silver alginate and ketoconazole 03/08/18 on evaluation today patient appears to be doing decently well as best I can tell in regard to his wounds. This is the first time that I have seen him as he generally is followed by Dr. Dellia Nims. With that being said none of his wounds appear to be infected he does have an area where there is some skin covering what appears to be [name] new wound on the left dorsal surface of his great toe. This is right at the nail bed. With that being said I do believe that debrided away some of the excess skin can be of benefit in this regard. Otherwise he has been tolerating the dressing changes without complication. 03/14/18; patient arrives today with the multiplicity of wounds that we are following. He has not been systemically unwell ooOriginal wound on the left lateral calf now only has 2 small open areas we've been using Hydrofera Blue which should continue ooThe deep tissue injury on the left heel requires debridement today. We've been using silver alginate ooThe left first second toe and the right first second toe are both are reminiscence what I think was tinea pedis. Apparently some of the callus Surface between the toes was removed last week when it started draining. ooPurulent drainage coming from the wound on the ischial tuberosity on the left. 03/21/18-He is here in follow-up evaluation for multiple wounds. 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 He tolerated debridement. The only change to last week's recommendations is to discontinue antifungal cream between toes. He will follow-up next week 03/28/18; following up for multiple wounds;Concern this week is streaking  redness and swelling in the right foot. He is going to need antibiotics for this. 03/31/18; follow-up for right foot cellulitis. Streaking redness and swelling in the right foot on 03/28/18. 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 I put him on clindamycin 300 mg every 6 and brought him in for [name] quick check. He has an open wound between his first and second toes on the right foot as [name] potential source. 04/04/18; ooRight foot cellulitis is resolving he is completing clindamycin. This is truly good news ooLeft lateral calf wound which is initial wound only has one small open area inferiorly this is close to healing out. He has compression stockings. We will use Hydrofera Blue right down to the epithelialization of this ooNonviable surface on the left heel which was initially pressure with [name] DTI. We've been using Hydrofera Blue. I'[name] going to switch this back to silver alginate ooLeft first second toe/tinea pedis this looks better using silver alginate ooRight first second toe tinea pedis using silver alginate ooLarge  pressure ulcers on theLeft ischial tuberosity. Small wound here Looks better. I am uncertain about the surface over the large wound. Using silver alginate 04/11/18; ooCellulitis in the right foot is resolved ooLeft lateral calf wound which was his original wounds still has 2 tiny open areas remaining this is just about closed ooNonviable surface on the left heel is better but still requires debridement ooLeft first second toe/tinea pedis still open using silver alginate ooRight first second toe wound tinea pedis I asked him to go back to using ketoconazole and silver alginate ooLarge pressure ulcers on the left ischial tuberosity this shear injury here is resolved. Wound is smaller. No evidence of infection using silver alginate 04/18/18; ooPatient arrives with an intense area of cellulitis in the right mid lower calf extending into the right heel area.  Bright red and warm. Smaller area on the left anterior leg. He has [name] significant history of MRSA. He will definitely need antibioticsoodoxycycline ooHe now has 2 open areas on the left ischial tuberosity the original large wound and now [name] satellite area which I think was above his initial satellite areas. Not [name] wonderful surface on this satellite area surrounding erythema which looks like pressure related. ooHis left lateral calf wound again his original wound is just about closed ooLeft heel pressure injury still requiring debridement ooLeft first second toe looks [name] lot better using silver alginate ooRight first second toe also using silver alginate and ketoconazole cream also looks better 04/20/18; the patient was worked in early today out of concerns with his cellulitis on the right leg. I had started him on doxycycline. This was 2 days ago. His wife was concerned about the swelling in the area. Also concerned about the left buttock. He has not been systemically unwell no fever chills. No nausea vomiting or diarrhea 04/25/18; the patient's left buttock wound is continued to deteriorate he is using Hydrofera Blue. He is still completing clindamycin for the cellulitis on the right leg although all of this looks better. 05/02/18 ooLeft buttock wound still with [name] lot of drainage and [name] very tightly adherent fibrinous necrotic surface. He has [name] deeper area superiorly ooThe left lateral calf wound is still closed ooDTI wound on the left heel necrotic surface especially the circumference using Iodoflex ooAreas between his left first second toe and right first second toe both look better. Dorsally and the right first second toe he had [name] necrotic surface although at smaller. In using silver alginate and ketoconazole. I did [name] culture last week which was [name] deep tissue culture of the reminiscence of the open wound on the right first second toe dorsally. This grew [name] few Acinetobacter and [name] few  methicillin-resistant staph aureus. Nevertheless the area actually this week looked better. I didn't feel the need to specifically address this at least in terms of systemic antibiotics. 05/09/18; wounds are measuring larger more drainage per our intake. We are using Santyl covered with alginate on the large superficial buttock wounds, Iodosorb on the left heel, ketoconazole and silver alginate to the dorsal first and second toes bilaterally. 05/16/18; ooThe area on his left buttock better in some aspects although the area superiorly over the ischial tuberosity required an extensive debridement.using Santyl ooLeft heel appears stable. Using Iodoflex ooThe areas between his first and second toes are not bad however there is spreading erythema up the dorsal aspect of his left foot this looks like cellulitis again. He is insensate the erythema is really very brilliant.o Erysipelas He went to see  an allergist days ago because he was itching part of this he had lab work done. This showed [name] white count of 15.1 with 70% neutrophils. Hemoglobin of 11.4 and [name] platelet count of 659,000. Last white count we had in [employer] was [name] 2-1/2 years ago which was 25.9 but he was ill at the time. He was able to show me some lab work that was done by his primary physician the pattern is about the same. 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 But prompted me to go ahead and do x-rays of both feet and the pelvis rule out osteomyelitis. He also had [name] comprehensive metabolic panel this was reasonably normal his albumin was 3.7 liver function tests BUN/creatinine all normal 05/23/18; x-rays of both his feet from last week were negative for underlying pulmonary abnormality. The x-ray of his pelvis however showed mild irregularity in the left ischial which may represent some early osteomyelitis. The wound in the left ischial continues to get deeper clearly now exposed muscle. Each  week necrotic surface material over this area. Whereas the rest of the wounds do not look so bad. ooThe left ischial wound we have been using Santyl and calcium alginate ooT the left heel surface necrotic debris using Iodoflex o ooThe left lateral leg is still healed ooAreas on the left dorsal foot and the right dorsal foot are about the same. There is some inflammation on the left which might represent contact dermatitis, fungal dermatitis I am doubtful cellulitis although this looks better than last week 05/30/18; CT scan done at Hospital did not show any osteomyelitis or abscess. Suggested the possibility of underlying cellulitis although I don't see [name] lot of evidence of this at the bedside ooThe wound itself on the left buttock/upper thigh actually looks somewhat better. No debridement ooLeft heel also looks better no debridement continue Iodoflex ooBoth dorsal first second toe spaces appear better using Lotrisone. Left still required debridement 06/06/18; ooIntake reported some purulent looking drainage from the left gluteal wound. Using Santyl and calcium alginate ooLeft heel looks better although still [name] nonviable surface requiring debridement ooThe left dorsal foot first/second webspace actually expanding and somewhat deeper. I may consider doing [name] shave biopsy of this area ooRight dorsal foot first/second webspace appears stable to improved. Using Lotrisone and silver alginate to both these areas 06/13/18 ooLeft gluteal surface looks better. Now separated in the 2 wounds. No debridement required. Still drainage. We'll continue silver alginate ooLeft heel continues to look better with Iodoflex continue this for at least another week ooOf his dorsal foot wounds the area on the left still has some depth although it looks better than last week. We've been using Lotrisone and silver alginate 06/20/18 ooLeft gluteal continues to look better healthy tissue ooLeft heel continues  to look better healthy granulation wound is smaller. He is using Iodoflex and his long as this continues continue the Iodoflex ooDorsal right foot looks better unfortunately dorsal left foot does not. There is swelling and erythema of his forefoot. He had minor trauma to this several days ago but doesn't think this was enough to have caused any tissue injury. Foot looks like cellulitis, we have had this problem before 06/27/18 on evaluation today patient appears to be doing [name] little worse in regard to his foot ulcer. Unfortunately it does appear that he has methicillin-resistant staph aureus and unfortunately there really are no oral options for him as he's allergic to sulfa drugs as  well as I box. Both of which would really be his only options for treating this infection. In the past he has been given and effusion of Orbactiv. This is done very well for him in the past again it's one time dosing IV antibiotic therapy. Subsequently I do believe this is something we're gonna need to see about doing at this point in time. Currently his other wounds seem to be doing somewhat better in my pinion I'[name] pretty happy in that regard. 07/03/18 on evaluation today patient's wounds actually appear to be doing fairly well. He has been tolerating the dressing changes without complication. All in all he seems to be showing signs of improvement. In regard to the antibiotics he has been dealing with infectious disease since I saw him last week as far as getting this scheduled. In the end [facility]'s [facility] this coming Friday. In the meantime he has been continuing to perform the dressing changes in such as previous. There does not appear to be any evidence of infection worsengin at this time. 07/10/18; ooSince I last saw this man 2 weeks ago things have actually improved. IV antibiotics of resulted in less forefoot erythema although there is still some present. He is  not systemically unwell ooLeft buttock wounds o2 now have no depth there is increased epithelialization Using silver alginate ooLeft heel still requires debridement using Iodoflex ooLeft dorsal foot still with [name] sizable wound about the size of [name] border but healthy granulation ooRight dorsal foot still with [name] slitlike area using silver alginate 07/18/18; the patient's cellulitis in the left foot is improved in fact I think it is on its way to resolving. ooLeft buttock wounds o2 both look better although the larger one has hypertension granulation we've been using silver alginate ooLeft heel has some thick circumferential redundant skin over the wound edge which will need to be removed today we've been using Iodoflex ooLeft dorsal foot is still [name] sizable wound required debridement using silver alginate ooThe right dorsal foot is just about closed only [name] small open area remains here 07/25/18; left foot cellulitis is resolved ooLeft buttock wounds o2 both look better. Hyper-granulation on the major area ooLeft heel as some debris over the surface but otherwise looks [name] healthier wound. Using silver collagen ooRight dorsal foot is just about closed 07/31/18; arrives with our intake nurse worried about purulent drainage from the buttock. We had hyper-granulation here last week ooHis buttock wounds o2 continue to look better ooLeft heel some debris over the surface but measuring smaller. ooRight dorsal foot unfortunately has openings between the toes ooLeft foot superficial wound looks less aggravated. 08/07/18 ooButtock wounds continue to look better although some of her granulation and the larger medial wound. silver alginate ooLeft heel continues to look [name] lot better.silver collagen ooLeft foot superficial wound looks less stable. Requires debridement. He has [name] new wound superficial area on the foot on the lateral dorsal foot. ooRight foot looks better using silver alginate without  Lotrisone 08/14/2018; patient was in the ER last week diagnosed with [name] UTI. He is now on Cefpodoxime and Macrodantin. ooButtock wounds continued to be smaller. Using silver alginate ooLeft heel continues to look better using silver collagen ooLeft foot superficial wound looks as though it is improving ooRight dorsal foot area is just about healed. 08/21/2018; patient is completed his antibiotics for his UTI. ooHe has 2 open areas on the buttocks. There is still not  closed although the surface looks satisfactory. Using silver alginate ooLeft heel continues to improve using silver collagen ooThe bilateral dorsal foot areas which are at the base of his first and second toes/possible tinea pedis are actually stable on the left but worse on the right. The area on the left required debridement of necrotic surface. After debridement I obtained [name] specimen for PCR culture. ooThe right dorsal foot which is been just about healed last week is now reopened 08/28/2018; culture done on the left dorsal foot showed coag negative staph both staph epidermidis and Lugdunensis. I think this is worthwhile initiating systemic treatment. I will use doxycycline given his long list of allergies. The area on the left heel slightly improved but still requiring debridement. ooThe large wound on the buttock is just about closed whereas the smaller one is larger. Using silver alginate in this area 09/04/2018; patient is completing his doxycycline for the left foot although this continues to be [name] very difficult wound area with very adherent necrotic debris. We are using silver alginate to all his wounds right foot left foot and the small wounds on his buttock, silver collagen on the left heel. 09/11/2018; once again this patient has intense erythema and swelling of the left forefoot. Lesser degrees of erythema in the right foot. He has [name] long list of allergies and intolerances. I will reinstitute doxycycline. oo2 small  areas on the left buttock are all the left of his major stage III pressure ulcer. Using silver alginate ooLeft heel also looks better using silver collagen ooUnfortunately both the areas on his feet look worse. The area on the left first second webspace is now gone through to the plantar part of his foot. The area on the left foot anteriorly is irritated with erythema and swelling in the forefoot. 09/25/2018 ooHis wound on the left plantar heel looks better. Using silver collagen ooThe area on the left buttock 2 small remnant areas. One is closed one is still open. Using silver alginate ooThe areas between both his first and second toes look worse. This in spite of long-standing antifungal therapy with ketoconazole and silver alginate which should have antifungal activity ooHe has small areas around his original wound on the left calf one is on the bottom of the original scar tissue and one superiorly both of these are small and superficial but again given wound history in this site this is worrisome 10/02/2018 ooLeft plantar heel continues to gradually contract using silver collagen ooLeft buttock wound is unchanged using silver alginate ooThe areas on his dorsal feet between his first and second toes bilaterally look about the same. 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 Small open area here. We have been using silver alginate 10/09/2018; ooLeft plantar heel continues to look healthy and contract. No debridement is required ooLeft buttock slightly smaller with [name] tape injury wound just below which was new this week ooDorsal feet somewhat improved I have been using clindamycin ooLeft lateral looks lower extremity the actual open area looks worse although [name] lot of this is epithelialized. I am going to  change to silver collagen today He has [name] lot more swelling in the right leg although this is not pitting not red and not particularly warm there is [name] lot of spasm in the right leg usually indicative of people with paralysis of some  underlying discomfort. We have reviewed his vascular status from 2017 he had [name] left greater saphenous vein ablation. I wonder about referring him back to vascular surgery if the area on the left leg continues to deteriorate. 10/16/2018 in today for follow-up and management of multiple lower extremity ulcers. His left Buttock wound is much lower smaller and almost closed completely. The wound to the left ankle has began to reopen with Epithelialization and some adherent slough. He has multiple new areas to the left foot and leg. The left dorsal foot without much improvement. Wound present between left great webspace and 2nd toe. Erythema and edema present right leg. Right LE ultrasound obtained on 10/10/18 was negative for DVT . 10/23/2018; ooLeft buttock is closed over. Still dry macerated skin but there is no open wound. I suspect this is chronic pressure/moisture ooLeft lateral calf is quite [name] bit worse than when I saw this last. There is clearly drainage here he has macerated skin into the left plantar heel. We will change the primary dressing to alginate ooLeft dorsal foot has some improvement in overall wound area. Still using clindamycin and silver alginate ooRight dorsal foot about the same as the left using clindamycin and silver alginate ooThe erythema in the right leg has resolved. He is DVT rule out was negative ooLeft heel pressure area required debridement although the wound is smaller and the surface is health 10/26/2018 ooThe patient came back in for his nurse check today predominantly because of the drainage coming out of the left lateral leg with [name] recent reopening of his original wound on the left lateral calf. He comes in today with [name] large  amount of surrounding erythema around the wound extending from the calf into the ankle and even in the area on the dorsal foot. He is not systemically unwell. He is not febrile. Nevertheless this looks like cellulitis. We have been using silver alginate to the area. I changed him to [name] regular visit and I am going to prescribe him doxycycline. The rationale here is [name] long list of medication intolerances and [name] history of MRSA. I did not see anything that I thought would provide [name] valuable culture 10/30/2018 ooFollow-up from his appointment 4 days ago with really an extensive area of cellulitis in the left calf left lateral ankle and left dorsal foot. I put him on doxycycline. He has [name] long list of medication allergies which are true allergy reactions. 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 In any case he is [name] lot better today. The erythema especially in the anterior and lateral left calf is better. He still has left ankle erythema. He also is complaining about increasing edema in the right leg we have only been using Kerlix Coban and he has been doing the wraps at home. Finally he has [name] spotty rash on the medial part of his upper left calf which looks like folliculitis or perhaps wrap occlusion type injury. Small superficial macules not pustules 11/06/18 patient arrives today with again [name] considerable degree of erythema around the wound on the left lateral calf extending into the dorsal ankle and dorsal foot. This is [name] lot worse than when I saw this last week. He is on doxycycline really with not [name] lot of improvement. He has not been systemically unwell Wounds on the; left heel actually looks improved. Original area on the left foot and proximity to the first and second toes looks about the  same. He has superficial areas on the dorsal foot, anterior calf and then the reopening of his original wound on the left lateral calf which  looks about the same ooThe only area he has on the right is the dorsal webspace first and second which is smaller. ooHe has [name] large area of dry erythematous skin on the left buttock small open area here. 11/13/2018; the patient arrives in much better condition. The erythema around the wound on the left lateral calf is [name] lot better. Not sure whether this was the clindamycin or the TCA and ketoconazole or just in the improvement in edema control [stasis dermatitis]. In any case this is [name] lot better. The area on the left heel is very small and just about resolved using silver collagen we have been using silver alginate to the areas on his dorsal feet 11/20/2018; his wounds include the left lateral calf, left heel, dorsal aspects of both feet just proximal to the first second webspace. He is stable to slightly improved. I did not think any changes to his dressings were going to be necessary 11/27/2018 he has [name] reopening on the left buttock which is surrounded by what looks like tinea or perhaps some other form of dermatitis. The area on the left dorsal foot has some erythema around it I have marked this area but I am not sure whether this is cellulitis or not. Left heel is not closed. Left calf the reopening is really slightly longer and probably worse 1/13; in general things look better and smaller except for the left dorsal foot. Area on the left heel is just about closed, left buttock looks better only [name] small wound remains in the skin looks better [using Lotrisone] 1/20; the area on the left heel only has [name] few remaining open areas here. Left lateral calf about the same in terms of size, left dorsal foot slightly larger right lateral foot still not closed. The area on the left buttock has no open wound and the surrounding skin looks [name] lot better 1/27; the area on the left heel is closed. Left lateral calf better but still requiring extensive debridements. The area on his left buttock is closed. He  still has the open areas on the left dorsal foot which is slightly smaller in the right foot which is slightly expanded. We have been using Iodoflex on these areas as well 2/3; left heel is closed. Left lateral calf still requiring debridement using Iodoflex there is no open area on his left buttock however he has dry scaly skin over [name] large area of this. Not really responding well to the Lotrisone. Finally the areas on his dorsal feet at the level of the first second webspace are slightly smaller on the right and about the same on the left. Both of these vigorously debrided with Anasept and gauze 2/10; left heel remains closed he has dry erythematous skin over the left buttock but there is no open wound here. Left lateral leg has come in and with. Still requiring debridement we have been using Iodoflex here. Finally the area on the left dorsal foot and right dorsal foot are really about the same extremely dry callused fissured areas. He does not yet have [name] dermatology appointment 2/17; left heel remains closed. He has [name] new open area on the left buttock. The area on the left lateral calf is bigger longer and still covered in necrotic debris. No major change in his foot areas bilaterally. I am awaiting for [name] dermatologist  to look on this. We have been using ketoconazole I do not know that this is been doing any good at all. 2/24; left heel remains closed. The left buttock wound that was new reopening last week looks better. The left lateral calf appears better also although still requires debridement. The major area on his foot is the left first second also requiring debridement. We have been putting Prisma on all wounds. I do not believe that the ketoconazole has done too much good for his feet. He will use Lotrisone I am going to give him [name] 2-week course of terbinafine. We still do not have [name] dermatology appointment 3/2 left heel remains closed however there is skin over bone in this area I pointed  this out to him today. The left buttock wound is epithelialized but still does not look completely stable. The area on the left leg required debridement were using silver collagen here. With regards to his feet we changed to Lotrisone last week and silver alginate. 3/9; left heel remains closed. Left buttock remains closed. The area on the right foot is essentially closed. The left foot remains unchanged. Slightly smaller on the left lateral calf. Using silver collagen to both of these areas 3/16-Left heel remains closed. Area on right foot is closed. Left lateral calf above the lateral malleolus open wound requiring debridement with easy bleeding. Left dorsal wound proximal to first toe also debrided. Left ischial area open new. Patient has been using Prisma with wrapping every 3 days. Dermatology appointment is apparently tomorrow.Patient has completed his terbinafine 2-week course with some apparent improvement according to him, there is still flaking and dry skin in his foot on the left 3/23; area on the right foot is reopened. The area on the left anterior foot is about the same still [name] very necrotic adherent surface. He still has the area on the left leg and reopening is on the left buttock. He apparently saw dermatology although I do not have [name] note. According to the patient who is usually fairly well informed they did not have any good ideas. Put him on oral terbinafine which she is been on before. 3/30; using silver collagen to all wounds. Apparently his dermatologist put him on doxycycline and rifampin presumably some culture grew staph. I do not have this result. He remains on terbinafine although I have used terbinafine on him before 4/6; patient has had [name] fairly substantial reopening on the right foot between the first and second toes. He is finished his terbinafine and I believe is on doxycycline and rifampin still as prescribed by dermatology. We have been using silver collagen to all  his wounds although the patient reports that he thinks silver alginate does better on the wounds on his buttock. 4/13; the area on his left lateral calf about the same size but it did not require debridement. ooLeft dorsal foot just proximal to the webspace between the first and second toes is about the same. Still nonviable surface. I note some superficial bronze discoloration of the dorsal part of his foot ooRight dorsal foot just proximal to the first and second toes also looks about the same. I still think there may be the same discoloration I noted above on the left ooLeft buttock wound looks about the same 4/20; left lateral calf appears to be gradually contracting using silver collagen. 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 The left dorsal foot wound is debrided of tightly adherent necrotic debris and  really cleans up quite nicely. The right area is worse with expansion. I did not debride this it is now over the base of the second toe ooThe area on his left buttock is smaller no debridement is required using silver collagen 5/4; left calf continues to make good progress. ooHe arrives with erythema around the wounds on his dorsal foot which even extends to the plantar aspect. Very concerning for coexistent infection. He is finished the erythromycin I gave him for possible erythrasma this does not seem to have helped. ooThe area on the left foot is about the same base of the dorsal toes ooIs area on the buttock looks improved on the left 5/11; left calf and left buttock continued to make good progress. Left foot is about the same to slightly improved. ooMajor problem is on the right foot. He has not had an x-ray. Deep tissue culture I did last week showed both Enterobacter and E. coli. I did not change the doxycycline I put him on empirically although neither 1 of these were plated to doxycycline. He arrives today with  the erythema looking worse on both the dorsal and plantar foot. Macerated skin on the bottom of the foot. he has not been systemically unwell 5/18-Patient returns at 1 week, left calf wound appears to be making some progress, left buttock wound appears slightly worse than last time, left foot wound looks slightly better, right foot redness is marginally better. X-ray of both feet show no air or evidence of osteomyelitis. Patient is finished his Omnicef and terbinafine. He continues to have macerated skin on the bottom of the left foot as well as right 5/26; left calf wound is better, left buttock wound appears to have multiple small superficial open areas with surrounding macerated skin. X-rays that I did last time showed no evidence of osteomyelitis in either foot. He is finished cefdinir and doxycycline. I do not think that he was on terbinafine. He continues to have [name] large superficial open area on the right foot anterior dorsal and slightly between the first and second toes. I did send him to dermatology 2 months ago or so wondering about whether they would do [name] fungal scraping. I do not believe they did but did do [name] culture. We have been using silver alginate to the toe areas, he has been using antifungals at home topically either ketoconazole or Lotrisone. We are using silver collagen on the left foot, silver alginate on the right, silver collagen on the left lateral leg and silver alginate on the left buttock 6/1; left buttock area is healed. We have the left dorsal foot, left lateral leg and right dorsal foot. We are using silver alginate to the areas on both feet and silver collagen to the area on his left lateral calf 6/8; the left buttock apparently reopened late last week. He is not really sure how this happened. He is tolerating the terbinafine. Using silver alginate to all wounds 6/15; left buttock wound is larger than last week but still superficial. ooCame in the clinic today with [name]  report of purulence from the left lateral leg I did not identify any infection ooBoth areas on his dorsal feet appear to be better. He is tolerating the terbinafine. Using silver alginate to all wounds 6/22; left buttock is about the same this week, left calf quite [name] bit better. His left foot is about the same however he comes in with erythema and warmth in the right forefoot once again. Culture that I gave  him in the beginning of May showed Enterobacter and E. coli. I gave him doxycycline and things seem to improve although neither 1 of these organisms was specifically plated. 6/29; left buttock is larger and dry this week. Left lateral calf looks to me to be improved. Left dorsal foot also somewhat improved right foot completely unchanged. The erythema on the right foot is still present. He is completing the Ceftin dinner that I gave him empirically [see discussion above.) 7/6 - All wounds look to be stable and perhaps improved, the left buttock wound is slightly smaller, per patient bleeds easily, completed ceftin, the right foot redness is less, he is on terbinafine 7/13; left buttock wound about the same perhaps slightly narrower. Area on the left lateral leg continues to narrow. Left dorsal foot slightly smaller right foot about the same. We are using silver alginate on the right foot and Hydrofera Blue to the areas on the left. Unna boot on the left 2 layer compression on the right 7/20; left buttock wound absolutely the same. Area on lateral leg continues to get better. Left dorsal foot require debridement as did the right no major change in the 7/27; left buttock wound the same size necrotic debris over the surface. The area on the lateral leg is closed once again. His left foot looks better right foot about the same although there is some involvement now of the posterior first second toe area. He is still on terbinafine which I have given him for [name] month, not certain [name] centimeter major  change 06/25/19-All wounds appear to be slightly improved according to report, left buttock wound looks clean, both foot wounds have minimal to no debris the right dorsal foot has minimal slough. We are using Hydrofera Blue to the left and silver alginate to the right foot and ischial wound. 8/10-Wounds all appear to be around the same, the right forefoot distal part has some redness which was not there before, however the wound looks clean and small. Ischial wound looks about the same with no changes 8/17; his wound on the left lateral calf which was his original chronic venous insufficiency wound remains closed. Since I last saw him the areas on the left dorsal foot right dorsal foot generally appear better but require debridement. The area on his left initial tuberosity appears somewhat larger to me perhaps hyper granulated and bleeds very easily. We have been using Hydrofera Blue to the left dorsal foot and silver alginate to everything else 8/24; left lateral calf remains closed. The areas on his dorsal feet on the webspace of the first and second toes bilaterally both look better. The area on the left buttock which is the pressure ulcer stage II slightly smaller. I change the dressing to Hydrofera Blue to all areas 8/31; left lateral calf remains closed. The area on his dorsal feet bilaterally look better. Using Hydrofera Blue. Still requiring debridement on the left foot. No change in the left buttock pressure ulcers however 9/14; left lateral calf remains closed. Dorsal feet look quite [name] bit better than 2 weeks ago. Flaking dry skin also [name] lot better with the ammonium lactate I gave him 2 weeks ago. The area on the left buttock is improved. He states that his Roho cushion developed [name] leak and he is getting [name] new one, in the interim he is offloading this vigorously 9/21; left calf remains closed. Left heel which was [name] possible DTI looks better this week. He had macerated tissue around the left  dorsal foot right foot looks satisfactory and improved left buttock wound. I changed his dressings to his feet to silver alginate bilaterally. Continuing Hydrofera Blue on the left buttock. 9/28 left calf remains closed. Left heel did not develop anything [possible DTI] dry flaking skin on the left dorsal foot. Right foot looks satisfactory. Improved left buttock wound. We are using silver alginate on his feet Hydrofera Blue on the buttock. I have asked him to go back to the Lotrisone on his feet including the wounds and surrounding areas 10/5; left calf remains closed. The areas on the left and right feet about the same. [name] lot of this is epithelialized however debris over the remaining open areas. He is using Lotrisone and silver alginate. The area on the left buttock using Hydrofera Blue 10/26. Patient has been out for 3 weeks secondary to Covid concerns. He tested negative but I think his wife tested positive. He comes in today with the left foot substantially worse, right foot about the same. Even more concerning he states that the area on his left buttock closed over but then reopened and is considerably deeper in one aspect than it was before [stage III wound] 11/2; left foot really about the same as last week. Quarter sized wound on the dorsal foot just proximal to the first second toes. Surrounding erythema with areas of denuded epithelium. This is not really much different looking. Did not look like cellulitis this time however. ooRight foot area about the same.. We have been using silver alginate alginate on his toes ooLeft buttock still substantial irritated skin around the wound which I think looks somewhat better. We have been using Hydrofera Blue here. 11/9; left foot larger than last week and [name] very necrotic surface. Right foot I think is about the same perhaps slightly smaller. Debris around the circumference also addressed. Unfortunately on the left buttock there is been [name] decline.  Satellite lesions below the major wound distally and now [name] an additional one posteriorly we have been using Hydrofera Blue but I think this is [name] pressure issue 11/16; left foot ulcer dorsally again [name] very adherent necrotic surface. Right foot is about the same. Not much change in the pressure ulcer on his left buttock. 11/30; left foot ulcer dorsally basically the same as when I saw him 2 weeks ago. Very adherent fibrinous debris on the wound surface. Patient reports [name] lot of drainage as well. 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 We have been using Iodoflex, patient changed back to alginate because of the drainage. Area on his right dorsal foot really looks benign with [name] healthier surface certainly [name] lot better than on the left. Left buttock wounds all improved using Hydrofera Blue 12/7; left dorsal foot again no improvement. Tightly adherent debris. PCR culture I did last week only showed likely skin contaminant. I have gone ahead and done [name] punch biopsy of this which is about the last thing in terms of investigations I can think to do. He has known venous insufficiency and venous hypertension and this could be the issue here. The area on the right foot is about the same left buttock slightly worse according to our intake nurse secondary to [facility] Blue sticking to the wound 12/14; biopsy of the left foot that I did last time showed changes that could be related to wound healing/chronic stasis dermatitis phenomenon no neoplasm. We have been using silver alginate to both feet. I change the one on  the left today to Sorbact and silver alginate to his other 2 wounds 12/28; the patient arrives with the following problems; ooMajor issue is the dorsal left foot which continues to be [name] larger deeper wound area. Still with [name] completely nonviable surface ooParadoxically the area mirror image on the right on the right dorsal foot appears to be getting  better. ooHe had some loss of dry denuded skin from the lower part of his original wound on the left lateral calf. Some of this area looked [name] little vulnerable and for this reason we put him in wrap that on this side this week ooThe area on his left buttock is larger. He still has the erythematous circular area which I think is [name] combination of pressure, sweat. This does not look like cellulitis or fungal dermatitis 11/26/2019; -Dorsal left foot large open wound with depth. Still debris over the surface. Using Sorbact ooThe area on the dorsal right foot paradoxically has closed over [facility] has [name] reopening on the left ankle laterally at the base of his original wound that extended up into the calf. This appears clean. ooThe left buttock wound is smaller but with very adherent necrotic debris over the surface. We have been using silver alginate here as well The patient had arterial studies done in 2017. He had biphasic waveforms at the dorsalis pedis and posterior tibial bilaterally. ABI in the left was 1.17. Digit waveforms were dampened. He has slight spasticity in the great toes I do not think [name] TBI would be possible 1/11; the patient comes in today with [name] sizable reopening between the first and second toes on the right. This is not exactly in the same location where we have been treating wounds previously. According to our intake nurse this was actually fairly deep but 0.6 cm. The area on the left dorsal foot looks about the same the surface is somewhat cleaner using Sorbact, his MRI is in 2 days. We have not managed yet to get arterial studies. The new reopening on the left lateral calf looks somewhat better using alginate. The left buttock wound is about the same using alginate 1/18; the patient had his ARTERIAL studies which were quite normal. ABI in the right at 1.13 with triphasic/biphasic waveforms on the left ABI 1.06 again with triphasic/biphasic waveforms. It would not have been possible  to have done [name] toe brachial index because of spasticity. We have been using Sorbac to the left foot alginate to the rest of his wounds on the right foot left lateral calf and left buttock 1/25; arrives in clinic with erythema and swelling of the left forefoot worse over the first MTP area. This extends laterally dorsally and but also posteriorly. Still has an area on the left lateral part of the lower part of his calf wound it is eschared and clearly not closed. ooArea on the left buttock still with surrounding irritation and erythema. ooRight foot surface wound dorsally. The area between the right and first and second toes appears better. 2/1; ooThe left foot wound is about the same. Erythema slightly better I gave him [name] week of doxycycline empirically ooRight foot wound is more extensive extending between the toes to the plantar surface ooLeft lateral calf really no open surface on the inferior part of his original wound however the entire area still looks vulnerable ooAbsolutely no improvement in the left buttock wound required debridement. 2/8; the left foot is about the same. Erythema is slightly improved I gave him clindamycin last week. ooRight  foot looks better he is using Lotrimin and silver alginate ooHe has [name] breakdown in the left lateral calf. Denuded epithelium which I have removed ooLeft buttock about the same were using Hydrofera Blue 2/15; left foot is about the same there is less surrounding erythema. Surface still has tightly adherent debris which I have debriding however not making any progress ooRight foot has [name] substantial wound on the medial right second toe between the first and second webspace. ooStill an open area on the left lateral calf distal area. ooButtock wound is about the same 2/22; left foot is about the same less surrounding erythema. Surface has adherent debris. Polymen Ag Right foot area significant wound between the first and second toes. We have  been using silver alginate here Left lateral leg polymen Ag at the base of his original venous insufficiency wound ooLeft buttock some improvement here 3/1; ooRight foot is deteriorating in the first second toe webspace. Larger and more substantial. We have been using silver alginate. ooLeft dorsal foot about the same markedly adherent surface debris using PolyMem Ag ooLeft lateral calf surface debris using PolyMem AG ooLeft buttock is improved again using PolyMem Ag. ooHe is completing his terbinafine. The erythema in the foot seems better. He has been on this for 2 weeks 3/8; no improvement in any wound area in fact he has [name] small open area on the dorsal midfoot which is new this week. He has not gotten his foot x-rays yet 3/15; his x-rays were both negative for osteomyelitis of both feet. No major change in any of his wounds on the extremities however his buttock wounds are better. We have been using polymen on the buttocks, left lower leg. Iodoflex on the left foot and silver alginate on the right 3/22; arrives in clinic today with the 2 major issues are the improvement in the left dorsal foot wound which for once actually looks healthy with [name] nice healthy wound surface without debridement. Using Iodoflex here. Unfortunately on the left lateral calf which is in the distal part of his original wound he came to the clinic here for there was purulent drainage noted some increased breakdown scattered around the original area and [name] small area proximally. We we are using polymen here will change to silver alginate today. His buttock wound on the left is better and I think the area on the right first second toe webspace is also improved 3/29; left dorsal foot looks better. Using Iodoflex. Left ankle culture from deterioration last time grew E. coli, Enterobacter and Enterococcus. I will give him [name] course of cefdinir although that will not cover Enterococcus. The area on the right foot in the  webspace of the first and second toe lateral first toe looks better. The area on his buttock is about healed Vascular appointment is on April 21. This is to look at his venous system vis--vis continued breakdown of the wounds on the left including the left lateral leg and left dorsal foot he. He has had previous ablations on this side 4/5; the area between the right first and second toes lateral aspect of the first toe looks better. Dorsal aspect of the left first toe on the left foot also improved. Unfortunately the left lateral lower leg is larger and there is [name] second satellite wound superiorly. The usual superficial abrasions on the left buttock overall better but certainly not closed 4/12; the area between the right first and second toes is improved. Dorsal aspect of the left foot also slightly  smaller with [name] vibrant healthy looking surface. No real change in the left lateral leg and the left buttock wound is healed He has an unaffordable co-pay for Apligraf. Appointment with vein and vascular with regards to the left leg venous part of the circulation is on 4/21 4/19; we continue to see improvement in all wound areas. Although this is minor. He has his vascular appointment on 4/21. The area on the left buttock has not reopened although right in the center of this area the skin looks somewhat threatened 4/26; the left buttock is unfortunately reopened. In general his left dorsal foot has [name] healthy surface and looks somewhat smaller although it was not measured as such. The area between his first and second toe webspace on the right as [name] small wound against the first toe. The patient saw vascular surgery. The real question I was asking was about the small saphenous vein on the left. He has previously ablated left greater saphenous vein. Nothing further was commented on on the left. Right greater saphenous vein without reflux at the saphenofemoral junction or proximal thigh there was no  indication for ablation of the right greater saphenous vein duplex was negative for DVT bilaterally. They did not think there was anything from [name] point of view that could be offered. They ABIs within normal limits 5/3; only small open area on the left buttock. The area on the left lateral leg which was his original venous reflux is now 2 wounds both which look clean. We are using Iodoflex on the left dorsal foot which looks healthy and smaller. He is down to [name] very tiny area between the right first and second toes, using silver alginate 5/10; all of his wounds appear better. We have much better edema control in 4 layer compression on the left. This may be the factor that is allowing the left foot and left lateral calf to heal. He has external compression garments at home 04/14/20-All of his wounds are progressing well, the left forefoot is practically closed, left ischium appears to be about the same, right toe webspace is also smaller. The left lateral leg is about the same, continue using Hydrofera Blue to this, silver alginate to the ischium, Iodoflex to the toe space on the right 6/7; most of his wounds outside of the left buttock are doing well. The area on the left lateral calf and left dorsal foot are smaller. The area on the right foot in between the first and second toe webspace is barely visible although he still says there is some drainage here is the only reason I did not heal this out. ooUnfortunately the area on the left buttock almost looks like he has [name] skin tear from tape. He has open wound and then [name] large flap of skin that we are trying to get adherence over an area just next to the remaining wound 6/21; 2 week follow-up. I believe is been here for nurse visits. Miraculously the area between his first and second toes on the left dorsal foot is closed over. Still open on the right first second web space. The left lateral calf has 2 open areas. Distally this is more  superficial. The proximal area had [name] little more depth and required debridement of adherent necrotic material. His buttock wound is actually larger we have been using silver alginate here 6/28; the patient's area on the left foot remains closed. Still open wet area between the first and second toes on the right and  also extending into the plantar aspect. We have been using silver alginate in this location. He has 2 areas on the left lower leg part of his original long wounds which I think are better. We have been using Hydrofera Blue here. Hydrofera Blue to the left buttock which is stable 7/12; left foot remains closed. Left ankle is closed. May be [name] small area between his right first and second toes the only truly open area is on the left buttock. We have been using Hydrofera Blue here 7/19; patient arrives with marked deterioration especially in the left foot and ankle. We did not put him in [name] compression wrap on the left last week in fact he wore his juxta lite stockings on either side although he does not have an underlying stocking. He has [name] reopening on the left dorsal foot, left lateral ankle and [name] new area on the right dorsal ankle. More worrisome is the degree of erythema on the left foot extending on the lateral foot into the lateral lower leg on the left Objective Constitutional Sitting or standing Blood Pressure is within target range for patient.. Patient is tachycardic. Respirations regular, non-labored and within target range.. Temperature is normal and within the target range for the patient.Marland Kitchen Appears in no distress. Vitals Time Taken: 8:11 AM, Weight: 216 lbs, Temperature: 98.1 F, Pulse: 109 bpm, Respiratory Rate: 18 breaths/min, Blood Pressure: 119/68 mmHg. General Notes: Wound exam ooLeft ischial tuberosity wound looks about the same. We have been using Hydrofera Blue the wound itself looks satisfactory. Same degree of erythema ooLeft lateral leg eschar subcu removed from the  lateral leg with [name] #5 curette this is clearly open underneath. Culture obtained ooNew opening on the left dorsal foot ooI cannot see anything open between the right first and second toe but he has [name] superficial area on the right dorsal ankle. Integumentary (Hair, Skin) Extensive degrees of erythema on the left forefoot dorsally extending laterally to the lateral ankle and lower leg. Wound #37R status is Open. Original cause of wound was Gradually Appeared. The wound is located on the Left,Lateral Malleolus. The wound measures 4cm length x 2.2cm width x 0.1cm depth; 6.912cm^2 area and 0.691cm^3 volume. There is Fat Layer (Subcutaneous Tissue) Exposed exposed. There is no tunneling or undermining noted. There is [name] medium amount of serosanguineous drainage noted. The wound margin is flat and intact. There is large (67-100%) pink granulation within the wound bed. There is [name] small (1-33%) amount of necrotic tissue within the wound bed including Adherent Slough. Wound #38 status is Open. Original cause of wound was Gradually Appeared. The wound is located on the Right T - Web between 1st and 2nd. The wound oe measures 4cm length x 3.5cm width x 0.2cm depth; 10.996cm^2 area and 2.199cm^3 volume. There is Fat Layer (Subcutaneous Tissue) Exposed exposed. There is no tunneling or undermining noted. There is [name] small amount of serosanguineous drainage noted. The wound margin is distinct with the outline attached to the wound base. There is large (67-100%) red granulation within the wound bed. There is [name] small (1-33%) amount of necrotic tissue within the wound bed including Adherent Slough. Wound #41 status is Open. Original cause of wound was Gradually Appeared. The wound is located on the Left Ischium. The wound measures 3.5cm length x 1.7cm width x 0.1cm depth; 4.673cm^2 area and 0.467cm^3 volume. There is Fat Layer (Subcutaneous Tissue) Exposed exposed. There is no tunneling or undermining noted. There is [name]  medium amount of serosanguineous drainage noted.  The wound margin is distinct with the outline attached to the wound base. There is large (67-100%) pink, friable granulation within the wound bed. There is no necrotic tissue within the wound bed. Wound #42 status is Open. Original cause of wound was Gradually Appeared. The wound is located on the Right,Dorsal Ankle. The wound measures 1cm length x 1.5cm width x 0.1cm depth; 1.178cm^2 area and 0.118cm^3 volume. There is Fat Layer (Subcutaneous Tissue) Exposed exposed. There is no tunneling noted. There is [name] small amount of serosanguineous drainage noted. There is small (1-33%) red granulation within the wound bed. There is [name] large (67-100%) amount of necrotic tissue within the wound bed including Eschar and Adherent Slough. Wound #43 status is Open. Original cause of wound was Gradually Appeared. The wound is located on the Left,Dorsal Foot. The wound measures 0.5cm length x 0.5cm width x 0.1cm depth; 0.196cm^2 area and 0.02cm^3 volume. There is no tunneling or undermining noted. There is [name] small amount of serosanguineous drainage noted. There is no granulation within the wound bed. There is [name] large (67-100%) amount of necrotic tissue within the wound bed including Adherent Slough. General Notes: foot red, swollen and warm to touch Assessment Active Problems ICD-10 Chronic venous hypertension (idiopathic) with ulcer and inflammation of left lower extremity 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 to breakdown of skin Non-pressure chronic ulcer of right ankle limited to breakdown of skin Non-pressure chronic ulcer of other part of right foot limited to breakdown of skin Non-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 Cellulitis of left lower limb Pressure ulcer of left buttock, stage 3 Paraplegia, complete Procedures Wound #37R Pre-procedure diagnosis of Wound #37R is [name] Venous Leg Ulcer located on the  Left,Lateral Malleolus .Severity of Tissue Pre Debridement is: Fat layer exposed. There was [name] Excisional Skin/Subcutaneous Tissue Debridement with [name] total area of 8.8 sq cm performed by Ricard Dillon., MD. With the following instrument(s): Curette to remove Viable and Non-Viable tissue/material. Material removed includes Subcutaneous Tissue, Slough, and Skin: Epidermis. No specimens were taken. [name] time out was conducted at 08:40, prior to the start of the procedure. [name] Minimum amount of bleeding was controlled with Pressure. The procedure was tolerated well with [name] pain level of 0 throughout and [name] pain level of 0 following the procedure. Post Debridement Measurements: 4cm length x 2.2cm width x 0.1cm depth; 0.691cm^3 volume. Character of Wound/Ulcer Post Debridement is improved. Severity of Tissue Post Debridement is: Fat layer exposed. Post procedure Diagnosis Wound #37R: Same as Pre-Procedure Pre-procedure diagnosis of Wound #37R is [name] Venous Leg Ulcer located on the Left,Lateral Malleolus . There was [name] Four Layer Compression Therapy Procedure by Levan Hurst, RN. Post procedure Diagnosis Wound #37R: Same as Pre-Procedure Wound #43 Pre-procedure diagnosis of Wound #43 is an Inflammatory located on the Left,Dorsal Foot . There was [name] Four Layer Compression Therapy Procedure by Levan Hurst, RN. Post procedure Diagnosis Wound #43: Same as Pre-Procedure Plan Follow-up Appointments: Return Appointment in 1 week. Dressing Change Frequency: Wound #37R Left,Lateral Malleolus: Do not change entire dressing for one week. Wound #38 Right T - Web between 1st and 2nd: oe Change Dressing every other day. Wound #41 Left Ischium: Change Dressing every other day. Wound #42 Right,Dorsal Ankle: Change Dressing every other day. Wound #43 Left,Dorsal Foot: Do not change entire dressing for one week. Skin Barriers/Peri-Wound Care: Moisturizing lotion - both legs Primary Wound Dressing: Wound #37R  Left,Lateral Malleolus: Calcium Alginate with Silver Wound #  92 Right T - Web between 1st and 2nd: oe Calcium Alginate with Silver Wound #41 Left Ischium: Hydrofera Blue Wound #42 Right,Dorsal Ankle: Calcium Alginate with Silver Wound #43 Left,Dorsal Foot: Calcium Alginate with Silver Secondary Dressing: Wound #37R Left,Lateral Malleolus: Dry Gauze Wound #38 Right T - Web between 1st and 2nd: oe Kerlix/Rolled Gauze - secure with tape Dry Gauze Wound #41 Left Ischium: Dry Gauze ABD pad Wound #42 Right,Dorsal Ankle: Kerlix/Rolled Gauze - secure with tape Dry Gauze Wound #43 Left,Dorsal Foot: Dry Gauze Edema Control: 4 layer compression: Left lower extremity Elevate legs to the level of the heart or above for 30 minutes daily and/or when sitting, [name] frequency of: - throughout the day Support Garment 30-40 mm/Hg pressure to: - Juxtalite to both legs daily Off-Loading: Low air-loss mattress (Group 2) Roho cushion for wheelchair Turn and reposition every 2 hours - out of wheelchair throughout the day, try to lay on sides, sleep in the bed not the recliner Laboratory ordered were: Anerobic culture - Left lateral ankle The following medication(s) was prescribed: doxycycline monohydrate oral 100 mg capsule 1 capsule oral bid for 7 days for cellluitis left foot starting 06/09/2020 1. We have had reopening of wounds on the right dorsal foot just proximal to the first second toe webspace and the right lateral ankle. I was concerned enough about the appearance of the right lateral ankle wound to do [name] culture. Erythema on the forefoot extending laterally into the ankle suggestive of cellulitis although it is possible that the although this has [name] large component of stasis dermatitis. 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 2. In view of the reasoning above I put him on doxycycline 100 twice daily for 7 days. I also felt it necessary to put him in compression. I warned him  that if he become systemically unwell then he is to seek more urgent medical attention then arm 1 week follow-up. 3. Culture done of the left lateral ankle wound 4. The patient has multiple allergies and intolerances. Doxycycline and clindamycin are the usual antibiotics we use I chose doxycycline. Electronic Signature(s) Signed: 06/10/2020 6:00:01 PM By: Linton Ham MD Entered By: Linton Ham on 06/09/2020 08:58:04 -------------------------------------------------------------------------------- SuperBill Details Patient Name: Date of Service: [name] [name] [name] 06/09/2020 Medical Record Number: [id number] Patient Account Number: [id number] Date of Birth/Sex: Treating RN: October 09, 1986 (34 y.o. 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 L97.511 Non-pressure chronic ulcer of other part of right foot limited to breakdown of skin L97.521 Non-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 L03.116 Cellulitis of left lower limb 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, 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, complete Facility Procedures CPT4 Code: [id number] Description: 11042 - DEB SUBQ TISSUE 20 SQ CM/< ICD-10 Diagnosis Description L97.321 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 to breakdown of skin Modifier: Quantity: 1 Physician Procedures : CPT4 Code Description Modifier [id number] 58099 - WC PHYS LEVEL 4 - EST PT 25 ICD-10 Diagnosis Description L97.321 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 to breakdown of skin L03.116  Cellulitis of left lower limb Quantity: 1 : [id number] 11042 - WC PHYS  SUBQ TISS 20 SQ CM ICD-10 Diagnosis Description E55.015 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 to breakdown of skin Quantity: 1 Electronic Signature(s) Signed: 06/10/2020 6:00:01 PM By: Linton Ham MD Entered By: Linton Ham on 06/09/2020 08:59:26

## 2020-06-12 LAB — AEROBIC CULTURE W GRAM STAIN (SUPERFICIAL SPECIMEN): Gram Stain: NONE SEEN

## 2020-06-13 NOTE — Progress Notes (Signed)
Muellner, KRISTOFF COONRADT ([id number]) Visit Report for 06/09/2020 Arrival Information Details Patient Name: Date of Service: Flippin, [name] 06/09/2020 8:00 A M Medical Record Number: [id number] Patient Account Number: [id number] Date of Birth/Sex: Treating RN: 1986-04-09 (34 y.o. [name]) Carlene Coria Primary Care Latrelle Fuston: [name], [name] Other Clinician: Referring Jarvis Knodel: Treating Camren Lipsett/Extender: [name]: 40 Visit Information History Since Last Visit All ordered tests and consults were completed: No Patient Arrived: Wheel Chair Added or deleted any medications: No Arrival Time: 08:10 Any new allergies or adverse reactions: No Accompanied By: self Had a fall or experienced change in No Transfer Assistance: None activities of daily living that may affect Patient Identification Verified: Yes risk of falls: Secondary Verification Process Completed: Yes Signs or symptoms of abuse/neglect since last visito No Patient Requires Transmission-Based Precautions: No Hospitalized since last visit: No Patient Has Alerts: Yes Implantable device outside of the clinic excluding No Patient Alerts: R ABI = 1.0 cellular tissue based products placed in the center L ABI = 1.1 since last visit: Has Dressing in Place as Prescribed: Yes Pain Present Now: No Electronic Signature(s) Signed: 06/13/2020 5:42:58 PM By: Carlene Coria RN Entered By: Carlene Coria on 06/09/2020 08:11:29 -------------------------------------------------------------------------------- Compression Therapy Details Patient Name: Date of Service: Donoghue, [name] 06/09/2020 8:00 A M Medical Record Number: [id number] Patient Account Number: [id number] Date of Birth/Sex: Treating RN: 15-Mar-1986 (34 y.o. [name] Primary Care Ismael Treptow: [place], [place] Other Clinician: Referring Yoshi Mancillas: Treating Biance Moncrief/Extender: [name]: 231 Compression Therapy Performed  for Wound Assessment: Wound #37R Left,Lateral Malleolus Performed By: Clinician Levan Hurst, RN Compression Type: Four Layer Post Procedure Diagnosis Same as Pre-procedure Electronic Signature(s) Signed: 06/09/2020 4:12:59 PM By: Levan Hurst RN, BSN Entered By: Levan Hurst on 06/09/2020 08:46:45 -------------------------------------------------------------------------------- Compression Therapy Details Patient Name: Date of Service: Handel, [name] 06/09/2020 8:00 A M Medical Record Number: [id number] Patient Account Number: [id number] Date of Birth/Sex: Treating RN: [birth year] (34 y.o. [name] Primary Care Jayten Gabbard: [place], [place] Other Clinician: Referring Maika Mcelveen: Treating Jakiah Goree/Extender: [name]: 231 Compression Therapy Performed for Wound Assessment: Wound #43 Left,Dorsal Foot Performed By: Clinician Levan Hurst, RN Compression Type: Four Layer Post Procedure Diagnosis Same as Pre-procedure Electronic Signature(s) Signed: 06/09/2020 4:12:59 PM By: Levan Hurst RN, BSN Entered By: Levan Hurst on 06/09/2020 08:46:46 -------------------------------------------------------------------------------- Encounter Discharge Information Details Patient Name: Date of Service: Bee, [name] 06/09/2020 8:00 A M Medical Record Number: [id number] Patient Account Number: [id number] Date of Birth/Sex: Treating RN: 03-May-1986 (33 y.o. [name] Primary Care Natlie Asfour: [place], [place] Other Clinician: Referring Davarius Ridener: Treating Turrell Severt/Extender: [name]: 231 Encounter Discharge Information Items Post Procedure Vitals Discharge Condition: Stable Temperature (F): 98.1 Ambulatory Status: Wheelchair Pulse (bpm): 109 Discharge Destination: Home Respiratory Rate (breaths/min): 18 Transportation: Private Auto Blood Pressure (mmHg): 119/68 Accompanied By: self Schedule  Follow-up Appointment: Yes Clinical Summary of Care: Patient Declined Electronic Signature(s) Signed: 06/10/2020 6:10:08 PM By: Baruch Gouty RN, BSN Entered By: Baruch Gouty on 06/09/2020 09:12:31 -------------------------------------------------------------------------------- Lower Extremity Assessment Details Patient Name: Date of Service: Penkala, [name] 06/09/2020 8:00 A M Medical Record Number: [id number] Patient Account Number: [id number] Date of Birth/Sex: Treating RN: 06/26/1986 (34 y.o. [name]) Carlene Coria Primary Care Mychal Decarlo: [place], [place] Other Clinician: Referring Joni Colegrove: Treating Margi Edmundson/Extender: Dutch Gray, [name]: 231 Edema Assessment Assessed: [Left: No] [Right: No] Edema: [Left: No] [Right: Yes] Calf Left: Right:  Point of Measurement: 33 cm From Medial Instep 34 cm 33 cm Ankle Left: Right: Point of Measurement: 10 cm From Medial Instep 24 cm 23 cm Electronic Signature(s) Signed: 06/13/2020 5:42:58 PM By: Carlene Coria RN Entered By: Carlene Coria on 06/09/2020 08:28:12 -------------------------------------------------------------------------------- Multi Wound Chart Details Patient Name: Date of Service: Ybarra, [name] 06/09/2020 8:00 A M Medical Record Number: [id number] Patient Account Number: [id number] Date of Birth/Sex: Treating RN: Mar 12, 1986 (34 y.o. [name] Primary Care Keygan Dumond: [name], [name] Other Clinician: Referring Undra Trembath: Treating Icesis Renn/Extender: Dutch Gray, [name]: 231 Vital Signs Height(in): Pulse(bpm): 109 Weight(lbs): 216 Blood Pressure(mmHg): 119/68 Body Mass Index(BMI): Temperature(F): 98.1 Respiratory Rate(breaths/min): 18 Photos: [37R:No Photos Left, Lateral Malleolus] [38:No 54 Right T - Web between 1st and 2nd Left Ischium oe] [41:No Photos] Wound Location: [37R:Gradually Appeared] [38:Gradually Appeared] [41:Gradually Appeared] Wounding  Event: [37R:Venous Leg Ulcer] [38:Inflammatory] [41:Pressure Ulcer] Primary Etiology: [37R:Sleep Apnea, Hypertension, Paraplegia Sleep Apnea, Hypertension, Paraplegia Sleep Apnea, Hypertension, Paraplegia] Comorbid History: [37R:11/26/2019] [38:11/30/2019] [41:03/16/2020] Date Acquired: [37R:28] [38:27] [41:12] Weeks of Treatment: [37R:Open] [38:Open] [41:Open] Wound Status: [37R:Yes] [38:No] [41:No] Wound Recurrence: [37R:4x2.2x0.1] [38:4x3.5x0.2] [41:3.5x1.7x0.1] Measurements L x W x D (cm) [37R:6.912] [38:10.996] [41:4.673] A (cm) : rea [37R:0.691] [38:2.199] [41:0.467] Volume (cm) : [37R:-74.60%] [38:-3232.10%] [41:-158.70%] % Reduction in A rea: [37R:-74.50%] [38:-851.90%] [41:-158.00%] % Reduction in Volume: [37R:Full Thickness Without Exposed] [38:Full Thickness Without Exposed] [41:Category/Stage II] Classification: [37R:Support Structures Medium] [38:Support Structures Small] [41:Medium] Exudate A mount: [37R:Serosanguineous] [38:Serosanguineous] [41:Serosanguineous] Exudate Type: [37R:red, brown] [38:red, brown] [41:red, brown] Exudate Color: [37R:Flat and Intact] [38:Distinct, outline attached] [41:Distinct, outline attached] Wound Margin: [37R:Large (67-100%)] [38:Large (67-100%)] [41:Large (67-100%)] Granulation A mount: [37R:Pink] [38:Red] [41:Pink, Friable] Granulation Quality: [37R:Small (1-33%)] [38:Small (1-33%)] [41:None Present (0%)] Necrotic A mount: [37R:Adherent Slough] [38:Adherent Slough] [41:N/A] Necrotic Tissue: [37R:Fat Layer (Subcutaneous Tissue)] [38:Fat Layer (Subcutaneous Tissue)] [41:Fat Layer (Subcutaneous Tissue)] Exposed Structures: [37R:Exposed: Yes Fascia: No Tendon: No Muscle: No Joint: No Bone: No None] [38:Exposed: Yes Medium (34-66%)] [41:Exposed: Yes Fascia: No Tendon: No Muscle: No Joint: No Bone: No Medium (34-66%)] Epithelialization: [37R:Debridement - Excisional] [38:N/A] [41:N/A] Debridement: Pre-procedure Verification/Time Out 08:40 [38:N/A]  [41:N/A] Taken: [37R:Subcutaneous, Slough] [38:N/A] [41:N/A] Tissue Debrided: [37R:Skin/Subcutaneous Tissue] [38:N/A] [41:N/A] Level: [37R:8.8] [38:N/A] [41:N/A] Debridement A (sq cm): [37R:rea Curette] [38:N/A] [41:N/A] Instrument: [37R:Minimum] [38:N/A] [41:N/A] Bleeding: [37R:Pressure] [38:N/A] [41:N/A] Hemostasis A chieved: [37R:0] [38:N/A] [41:N/A] Procedural Pain: [37R:0] [38:N/A] [41:N/A] Post Procedural Pain: [37R:Procedure was tolerated well] [38:N/A] [41:N/A] Debridement Treatment Response: [37R:4x2.2x0.1] [38:N/A] [41:N/A] Post Debridement Measurements L x W x D (cm) [37R:0.691] [38:N/A] [41:N/A] Post Debridement Volume: (cm) [37R:N/A] [38:N/A] [41:N/A] Assessment Notes: [37R:Compression Therapy] [38:N/A] [41:N/A] Procedures Performed: [37R:Debridement] [38:42 57] [41:N/A] Photos: [37R:No Photos Right Lower Leg] [38:No Photos Left, Dorsal Foot] [41:N/A N/A] Wound Location: [37R:Gradually Appeared] [38:Gradually Appeared] [41:N/A] Wounding Event: [37R:Inflammatory] [38:Inflammatory] [41:N/A] Primary Etiology: [37R:Sleep Apnea, Hypertension, Paraplegia Sleep Apnea, Hypertension, Paraplegia N/A] Comorbid History: [37R:06/06/2020] [38:06/06/2020] [41:N/A] Date Acquired: [37R:0] [38:0] [41:N/A] Weeks of Treatment: [37R:Open] [38:Open] [41:N/A] Wound Status: [37R:No] [38:No] [41:N/A] Wound Recurrence: [37R:1x1.5x0.1] [38:0.5x0.5x0.1] [41:N/A] Measurements L x W x D (cm) [37R:1.178] [38:0.196] [41:N/A] A (cm) : rea [37R:0.118] [38:0.02] [41:N/A] Volume (cm) : [37R:N/A] [38:N/A] [41:N/A] % Reduction in A rea: [37R:N/A] [38:N/A] [41:N/A] % Reduction in Volume: [37R:Full Thickness Without Exposed] [38:Full Thickness Without Exposed] [41:N/A] Classification: [37R:Support Structures Small] [38:Support Structures Small] [41:N/A] Exudate A mount: [37R:Serosanguineous] [38:Serosanguineous] [41:N/A] Exudate Type: [37R:red, brown] [38:red, brown] [41:N/A] Exudate Color: [37R:N/A]  [38:N/A] [41:N/A] Wound Margin: [37R:Small (1-33%)] [38:None Present (0%)] [41:N/A] Granulation A mount: [37R:Red] [  38:N/A] [41:N/A] Granulation Quality: [37R:Large (67-100%)] [38:Large (67-100%)] [41:N/A] Necrotic A mount: [37R:Eschar, Adherent Slough] [38:Adherent Slough] [41:N/A] Necrotic Tissue: [37R:Fat Layer (Subcutaneous Tissue)] [38:Fascia: No] [41:N/A] Exposed Structures: [37R:Exposed: Yes Fascia: No Tendon: No Muscle: No Joint: No Bone: No None] [38:Fat Layer (Subcutaneous Tissue) Exposed: No Tendon: No Muscle: No Joint: No Bone: No None] [41:N/A] Epithelialization: [37R:N/A] [38:N/A] [41:N/A] Debridement: [37R:N/A] [38:N/A] [41:N/A] Tissue Debrided: [37R:N/A] [38:N/A] [41:N/A] Level: [37R:N/A] [38:N/A] [41:N/A] Debridement A (sq cm): [37R:rea N/A] [38:N/A] [41:N/A] Instrument: [37R:N/A] [38:N/A] [41:N/A] Bleeding: [37R:N/A] [38:N/A] [41:N/A] Hemostasis A chieved: [37R:N/A] [38:N/A] [41:N/A] Procedural Pain: [37R:N/A] [38:N/A] [41:N/A] Post Procedural Pain: Debridement Treatment Response: N/A [38:N/A] [41:N/A] Post Debridement Measurements L x N/A [38:N/A] [41:N/A] W x D (cm) [37R:N/A] [38:N/A] [41:N/A] Post Debridement Volume: (cm) [37R:N/A] [38:foot red, swollen and warm to touch] [41:N/A] Assessment Notes: [37R:N/A] [38:Compression Therapy] [41:N/A] Treatment Notes Electronic Signature(s) Signed: 06/09/2020 4:12:59 PM By: Levan Hurst RN, BSN Signed: 06/10/2020 6:00:01 PM By: Linton Ham MD Entered By: Linton Ham on 06/09/2020 08:48:37 -------------------------------------------------------------------------------- Multi-Disciplinary Care Plan Details Patient Name: Date of Service: Muzquiz, [name] 06/09/2020 8:00 A M Medical Record Number: [id number] Patient Account Number: [id number] Date of Birth/Sex: Treating RN: May 07, 1986 (34 y.o. [name] Primary Care Namiko Pritts: [name], [name] Other Clinician: Referring Daimion Adamcik: Treating Jacoby Ritsema/Extender:  [name]: 231 Active Inactive Wound/Skin Impairment Nursing Diagnoses: Impaired tissue integrity Knowledge deficit related to ulceration/compromised skin integrity Goals: Patient/caregiver will verbalize understanding of skin care regimen Date Initiated: 01/05/2016 Target Resolution Date: 07/11/2020 Goal Status: Active Ulcer/skin breakdown will have a volume reduction of 30% by week 4 Date Initiated: 01/05/2016 Date Inactivated: 12/22/2017 Target Resolution Date: 01/19/2018 Unmet Reason: complex wounds, Goal Status: Unmet infection Interventions: Assess patient/caregiver ability to obtain necessary supplies Assess ulceration(s) every visit Provide education on ulcer and skin care Notes: Electronic Signature(s) Signed: 06/09/2020 4:12:59 PM By: Levan Hurst RN, BSN Entered By: Levan Hurst on 06/09/2020 08:14:22 -------------------------------------------------------------------------------- Pain Assessment Details Patient Name: Date of Service: Nienhaus, [name] 06/09/2020 8:00 A M Medical Record Number: [id number] Patient Account Number: [id number] Date of Birth/Sex: Treating RN: 05/31/1986 (34 y.o. [name] Primary Care Harjit Leider: [place], [place] Other Clinician: Referring Chauncy Mangiaracina: Treating Demorris Choyce/Extender: [name]: 231 Active Problems Location of Pain Severity and Description of Pain Patient Has Paino No Site Locations Pain Management and Medication Current Pain Management: Electronic Signature(s) Signed: 06/13/2020 5:42:58 PM By: Carlene Coria RN Entered By: Carlene Coria on 06/09/2020 08:12:09 -------------------------------------------------------------------------------- Patient/Caregiver Education Details Patient Name: Date of Service: Mortell, [name] 7/19/2021andnbsp8:00 A M Medical Record Number: [id number] Patient Account Number: [id number] Date of Birth/Gender: Treating  RN: [birth year] (34 y.o. [name] Primary Care Physician: Janine Limbo Other Clinician: Referring Physician: Treating Physician/Extender: [name]: 60 Education Assessment Education Provided To: Patient Education Topics Provided Wound/Skin Impairment: Methods: Explain/Verbal Responses: State content correctly Electronic Signature(s) Signed: 06/09/2020 4:12:59 PM By: Levan Hurst RN, BSN Entered By: Levan Hurst on 06/09/2020 08:14:37 -------------------------------------------------------------------------------- Wound Assessment Details Patient Name: Date of Service: Netz, [name] 06/09/2020 8:00 A M Medical Record Number: [id number] Patient Account Number: [id number] Date of Birth/Sex: Treating RN: 07/19/1986 (34 y.o. [name] Primary Care Reality Dejonge: [place], [place] Other Clinician: Referring Devion Chriscoe: Treating Annayah Worthley/Extender: [name]: 231 Wound Status Wound Number: [id number] Primary Etiology: Venous Leg Ulcer Wound Location: Left, Lateral Malleolus Wound Status: Open Wounding Event: Gradually Appeared Comorbid History: Sleep Apnea, Hypertension,  Paraplegia Date Acquired: 11/26/2019 Weeks Of Treatment: 28 Clustered Wound: No Photos Photo Uploaded By: Mikeal Hawthorne on 06/09/2020 13:33:50 Wound Measurements Length: (cm) 4 Width: (cm) 2.2 Depth: (cm) 0.1 Area: (cm) 6.912 Volume: (cm) 0.691 % Reduction in Area: -74.6% % Reduction in Volume: -74.5% Epithelialization: None Tunneling: No Undermining: No Wound Description Classification: Full Thickness Without Exposed Support Structures Wound Margin: Flat and Intact Exudate Amount: Medium Exudate Type: Serosanguineous Exudate Color: red, brown Foul Odor After Cleansing: No Slough/Fibrino Yes Wound Bed Granulation Amount: Large (67-100%) Exposed Structure Granulation Quality: Pink Fascia Exposed: No Necrotic  Amount: Small (1-33%) Fat Layer (Subcutaneous Tissue) Exposed: Yes Necrotic Quality: Adherent Slough Tendon Exposed: No Muscle Exposed: No Joint Exposed: No Bone Exposed: No Treatment Notes Wound #37R (Left, Lateral Malleolus) 2. Periwound Care Moisturizing lotion 3. Primary Dressing Applied Calcium Alginate Ag 4. Secondary Dressing Dry Gauze 6. Support Layer Applied 4 layer compression Water quality scientist) Signed: 06/09/2020 4:12:59 PM By: Levan Hurst RN, BSN Entered By: Levan Hurst on 06/09/2020 08:43:49 -------------------------------------------------------------------------------- Wound Assessment Details Patient Name: Date of Service: Barbian, [name] 06/09/2020 8:00 A M Medical Record Number: [id number] Patient Account Number: [id number] Date of Birth/Sex: Treating RN: 1986-10-21 (34 y.o. [name]) Carlene Coria Primary Care Elayne Gruver: [name], [name] Other Clinician: Referring Timiyah Romito: Treating Shatonia Hoots/Extender: [name]: 231 Wound Status Wound Number: 38 Primary Etiology: Inflammatory Wound Location: Right T - Web between 1st and 2nd oe Wound Status: Open Wounding Event: Gradually Appeared Comorbid History: Sleep Apnea, Hypertension, Paraplegia Date Acquired: 11/30/2019 Weeks Of Treatment: 27 Clustered Wound: No Photos Photo Uploaded By: Mikeal Hawthorne on 06/09/2020 13:28:38 Wound Measurements Length: (cm) 4 Width: (cm) 3.5 Depth: (cm) 0.2 Area: (cm) 10.996 Volume: (cm) 2.199 % Reduction in Area: -3232.1% % Reduction in Volume: -851.9% Epithelialization: Medium (34-66%) Tunneling: No Undermining: No Wound Description Classification: Full Thickness Without Exposed Support Structu Wound Margin: Distinct, outline attached Exudate Amount: Small Exudate Type: Serosanguineous Exudate Color: red, brown res Foul Odor After Cleansing: No Slough/Fibrino Yes Wound Bed Granulation Amount: Large (67-100%) Exposed  Structure Granulation Quality: Red Fat Layer (Subcutaneous Tissue) Exposed: Yes Necrotic Amount: Small (1-33%) Necrotic Quality: Adherent Slough Treatment Notes Wound #38 (Right Toe - Web between 1st and 2nd) 2. Periwound Care Moisturizing lotion Skin Prep 3. Primary Dressing Applied Calcium Alginate Ag 4. Secondary Dressing Dry Gauze Roll Gauze Foam Border Dressing 6. Support Layer Applied Other support layer (specify in notes) Notes Development worker, international aid) Signed: 06/13/2020 5:42:58 PM By: Carlene Coria RN Entered By: Carlene Coria on 06/09/2020 08:33:16 -------------------------------------------------------------------------------- Wound Assessment Details Patient Name: Date of Service: Leahy, [name] 06/09/2020 8:00 A M Medical Record Number: [id number] Patient Account Number: [id number] Date of Birth/Sex: Treating RN: [birth year] (34 y.o. [name]) Carlene Coria Primary Care Kelechi Astarita: [place], [place] Other Clinician: Referring Tramar Brueckner: Treating Tyniah Kastens/Extender: Dutch Gray, [name]: 231 Wound Status Wound Number: 41 Primary Etiology: Pressure Ulcer Wound Location: Left Ischium Wound Status: Open Wounding Event: Gradually Appeared Comorbid History: Sleep Apnea, Hypertension, Paraplegia Date Acquired: 03/16/2020 Weeks Of Treatment: 12 Clustered Wound: No Photos Photo Uploaded By: Mikeal Hawthorne on 06/09/2020 13:29:10 Wound Measurements Length: (cm) 3.5 Width: (cm) 1.7 Depth: (cm) 0.1 Area: (cm) 4.673 Volume: (cm) 0.467 % Reduction in Area: -158.7% % Reduction in Volume: -158% Epithelialization: Medium (34-66%) Tunneling: No Undermining: No Wound Description Classification: Category/Stage II Wound Margin: Distinct, outline attached Exudate Amount: Medium Exudate Type: Serosanguineous Exudate Color: red, brown Foul Odor After Cleansing: No Slough/Fibrino No Wound  Bed Granulation Amount: Large (67-100%) Exposed  Structure Granulation Quality: Pink, Friable Fascia Exposed: No Necrotic Amount: None Present (0%) Fat Layer (Subcutaneous Tissue) Exposed: Yes Tendon Exposed: No Muscle Exposed: No Joint Exposed: No Bone Exposed: No Treatment Notes Wound #41 (Left Ischium) 3. Primary Dressing Applied Hydrofera Blue 4. Secondary Dressing Foam Border Dressing Notes juxt Electronic Signature(s) Signed: 06/13/2020 5:42:58 PM By: Carlene Coria RN Entered By: Carlene Coria on 06/09/2020 08:33:42 -------------------------------------------------------------------------------- Wound Assessment Details Patient Name: Date of Service: Biglow, [name] 06/09/2020 8:00 A M Medical Record Number: [id number] Patient Account Number: [id number] Date of Birth/Sex: Treating RN: Oct 30, 1986 (34 y.o. [name]) Carlene Coria Primary Care Jacksen Isip: [place], [place] Other Clinician: Referring Jaquarius Seder: Treating Aveion Nguyen/Extender: [name]: 231 Wound Status Wound Number: 42 Primary Etiology: Inflammatory Wound Location: Right Lower Leg Wound Status: Open Wounding Event: Gradually Appeared Comorbid History: Sleep Apnea, Hypertension, Paraplegia Date Acquired: 06/06/2020 Weeks Of Treatment: 0 Clustered Wound: No Photos Photo Uploaded By: Mikeal Hawthorne on 06/09/2020 13:28:39 Wound Measurements Length: (cm) 1 Width: (cm) 1.5 Depth: (cm) 0.1 Area: (cm) 1.178 Volume: (cm) 0.118 % Reduction in Area: % Reduction in Volume: Epithelialization: None Tunneling: No Wound Description Classification: Full Thickness Without Exposed Support Structures Exudate Amount: Small Exudate Type: Serosanguineous Exudate Color: red, brown Foul Odor After Cleansing: No Slough/Fibrino Yes Wound Bed Granulation Amount: Small (1-33%) Exposed Structure Granulation Quality: Red Fascia Exposed: No Necrotic Amount: Large (67-100%) Fat Layer (Subcutaneous Tissue) Exposed: Yes Necrotic Quality: Eschar,  Adherent Slough Tendon Exposed: No Muscle Exposed: No Joint Exposed: No Bone Exposed: No Electronic Signature(s) Signed: 06/13/2020 5:42:58 PM By: Carlene Coria RN Entered By: Carlene Coria on 06/09/2020 08:30:36 -------------------------------------------------------------------------------- Wound Assessment Details Patient Name: Date of Service: Beebe, [name] 06/09/2020 8:00 A M Medical Record Number: [id number] Patient Account Number: [id number] Date of Birth/Sex: Treating RN: 02-18-1986 (34 y.o. [name]) Carlene Coria Primary Care Amiee Wiley: [name], [name] Other Clinician: Referring Kimoni Pickerill: Treating Raul Winterhalter/Extender: [name]: 231 Wound Status Wound Number: 43 Primary Etiology: Inflammatory Wound Location: Left, Dorsal Foot Wound Status: Open Wounding Event: Gradually Appeared Comorbid History: Sleep Apnea, Hypertension, Paraplegia Date Acquired: 06/06/2020 Weeks Of Treatment: 0 Clustered Wound: No Photos Photo Uploaded By: Mikeal Hawthorne on 06/09/2020 13:29:11 Wound Measurements Length: (cm) 0.5 Width: (cm) 0.5 Depth: (cm) 0.1 Area: (cm) 0.196 Volume: (cm) 0.02 % Reduction in Area: % Reduction in Volume: Epithelialization: None Tunneling: No Undermining: No Wound Description Classification: Full Thickness Without Exposed Support Structures Exudate Amount: Small Exudate Type: Serosanguineous Exudate Color: red, brown Foul Odor After Cleansing: No Slough/Fibrino Yes Wound Bed Granulation Amount: None Present (0%) Exposed Structure Necrotic Amount: Large (67-100%) Fascia Exposed: No Necrotic Quality: Adherent Slough Fat Layer (Subcutaneous Tissue) Exposed: No Tendon Exposed: No Muscle Exposed: No Joint Exposed: No Bone Exposed: No Assessment Notes foot red, swollen and warm to touch Treatment Notes Wound #43 (Left, Dorsal Foot) 2. Periwound Care Moisturizing lotion 3. Primary Dressing Applied Calcium Alginate  Ag 4. Secondary Dressing Dry Gauze 6. Support Layer Applied 4 layer compression Water quality scientist) Signed: 06/13/2020 5:42:58 PM By: Carlene Coria RN Entered By: Carlene Coria on 06/09/2020 08:32:21 -------------------------------------------------------------------------------- Vitals Details Patient Name: Date of Service: Aguinaldo, [name] 06/09/2020 8:00 A M Medical Record Number: [id number] Patient Account Number: [id number] Date of Birth/Sex: Treating RN: 14-Jul-1986 (34 y.o. [name]) Carlene Coria Primary Care Calianne Larue: [name], [name] Other Clinician: Referring Sharanda Shinault: Treating Brittanny Levenhagen/Extender: [name]: 231 Vital Signs Time  Taken: 08:11 Temperature (F): 98.1 Weight (lbs): 216 Pulse (bpm): 109 Respiratory Rate (breaths/min): 18 Blood Pressure (mmHg): 119/68 Reference Range: 80 - 120 mg / dl Electronic Signature(s) Signed: 06/13/2020 5:42:58 PM By: Carlene Coria RN Entered By: Carlene Coria on 06/09/2020 08:12:02

## 2020-06-16 ENCOUNTER — Encounter (HOSPITAL_BASED_OUTPATIENT_CLINIC_OR_DEPARTMENT_OTHER): Payer: BC Managed Care – PPO | Admitting: Internal Medicine

## 2020-06-16 DIAGNOSIS — L97521 Non-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: Secondary | ICD-10-CM | POA: Diagnosis not present

## 2020-06-16 DIAGNOSIS — G8221 Paraplegia, complete: Secondary | ICD-10-CM | POA: Diagnosis not present

## 2020-06-16 DIAGNOSIS — I87332 Chronic venous hypertension (idiopathic) with ulcer and inflammation of left lower extremity: Secondary | ICD-10-CM | POA: Diagnosis not present

## 2020-06-16 DIAGNOSIS — I872 Venous insufficiency (chronic) (peripheral): Secondary | ICD-10-CM | POA: Diagnosis not present

## 2020-06-16 DIAGNOSIS — Z8614 Personal history of Methicillin resistant Staphylococcus aureus infection: Secondary | ICD-10-CM | POA: Diagnosis not present

## 2020-06-16 DIAGNOSIS — L89323 Pressure ulcer of left buttock, stage 3: Secondary | ICD-10-CM | POA: Diagnosis not present

## 2020-06-16 DIAGNOSIS — I890 Lymphedema, not elsewhere classified: Secondary | ICD-10-CM | POA: Diagnosis not present

## 2020-06-16 DIAGNOSIS — L03116 Cellulitis of left lower limb: Secondary | ICD-10-CM | POA: Diagnosis not present

## 2020-06-16 DIAGNOSIS — Z87440 Personal history of urinary (tract) infections: Secondary | ICD-10-CM | POA: Diagnosis not present

## 2020-06-16 DIAGNOSIS — L97511 Non-pressure chronic ulcer of other part of right foot limited to breakdown of skin: Secondary | ICD-10-CM | POA: Diagnosis not present

## 2020-06-16 DIAGNOSIS — L97311 Non-pressure chronic ulcer of right ankle limited to breakdown of skin: Secondary | ICD-10-CM | POA: Diagnosis not present

## 2020-06-16 DIAGNOSIS — L97312 Non-pressure chronic ulcer of right 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: Secondary | ICD-10-CM | POA: Diagnosis not present

## 2020-06-16 DIAGNOSIS — L97512 Non-pressure chronic ulcer of other part of right foot with fat layer exposed: Secondary | ICD-10-CM | POA: Diagnosis not present

## 2020-06-16 DIAGNOSIS — L97321 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 to breakdown of skin: Secondary | ICD-10-CM | POA: Diagnosis not present

## 2020-06-16 DIAGNOSIS — Z87891 Personal history of nicotine dependence: Secondary | ICD-10-CM | POA: Diagnosis not present

## 2020-06-18 NOTE — Progress Notes (Signed)
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 over the surface no debridement indicated. Continue Hydrofera Blue difficult areas between his toes first and third on the left than first and second on the right. I'[name] going to change to silver alginate from silver collagen. Continue ketoconazole as I suspect underlying tinea pedis 05/26/17; left lateral leg venous insufficiency wound. We've been using Hydrofera Blue. I believe that there is expanding epithelialization over the surface of the wound albeit not coming from the wound circumference. This is [name] bit of an odd situation in which the epithelialization seems to be coming from the surface of the wound rather than in the exact circumference. There is still small open areas mostly along the lateral margin of the wound. He has unchanged areas between the left first and second and the right first second toes which I been treating for tenia pedis 06/02/17; left lateral leg venous insufficiency wound. We have been using Hydrofera Blue. Somewhat smaller from the wound circumference. The surface of the wound remains [name] bit on it almost epithelialized sedation in appearance. I use an open curette today debridement in the surface of all of this especially the edges Small open wounds remaining on the dorsal right first and second toe interspace and the plantar left first second toe and her face on the left 06/09/17; wound on the left lateral leg continues to be smaller but very gradual and very dry surface using Hydrofera Blue 06/16/17 requires weekly debridements now on the left lateral leg although this continues  to contract. I changed to silver collagen last week because of dryness of the wound  bed. Using Iodoflex to the areas on his first and second toes/web space bilaterally 06/24/17; patient with history of paraplegia also chronic venous insufficiency with lymphedema. Has [name] very difficult wound on the left lateral leg. This has been gradually reducing in terms of with but comes in with [name] very dry adherent surface. High switch to silver collagen [name] week or so ago with hydrogel to keep the area moist. This is been refractory to multiple dressing attempts. He also has areas in his first and second toes bilaterally in the anterior and posterior web space. I had been using Iodoflex here after [name] prolonged course of silver alginate with ketoconazole was ineffective [question tinea pedis] 07/14/17; patient arrives today with [name] very difficult adherent material over his left lateral lower leg wound. He also has surrounding erythema and poorly controlled edema. He was switched his Santyl last visit which the nurses are applying once during his doctor visit and once on [name] nurse visit. He was also reduced to 2 layer compression I'[name] not exactly sure of the issue here. 07/21/17; better surface today after 1 week of Iodoflex. Significant cellulitis that we treated last week also better. [Doxycycline] 07/28/17 better surface today with now 2 weeks of Iodoflex. Significant cellulitis treated with doxycycline. He has now completed the doxycycline and he is back to his usual degree of chronic venous inflammation/stasis dermatitis. He reminds me he has had ablations surgery here 08/04/17; continued improvement with Iodoflex to the left lateral leg wound in terms of the surface of the wound although the dimensions are better. 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 Problematic areas on the plantar aspect of the first second toe web space on the left and the dorsal  aspect of the first second toe web space on the right. At one point I felt these were probably related to chronic fungal infections in treated him aggressively for this although we have not made any improvement here. 08/11/17; left lateral leg. Surface continues to improve with the Iodoflex although we are not seeing much improvement in overall wound dimensions. Areas on his plantar left foot and right foot show no improvement. In fact the right foot looks somewhat worse 08/18/17; left lateral leg. We changed to [facility] Blue last week after [name] prolonged course of Iodoflex which helps get the surface better. It appears that the wound with is improved. Continue with difficult areas on the left dorsal first second and plantar first second on the right 09/01/17; patient arrives in clinic today having had [name] temperature of 103 yesterday. He was seen in the ER and [facility] The patient was concerned he could have cellulitis again in the right leg however they diagnosed him with [name] UTI and he is now on Keflex. He has [name] history of cellulitis which is been recurrent and difficult but this is been in the left leg, in the past 5 use doxycycline. He does in and out catheterizations at home which are risk factors for UTI 09/08/17; patient will be completing his Keflex this weekend. The erythema on the left leg is considerably better. He has [name] new wound today on the medial part of the right leg small superficial almost looks like [name] skin tear. He has worsening of the area on the right dorsal first and second toe. His major area on the left lateral leg is better. Using Hydrofera Blue on all areas 09/15/17; gradual reduction in width on the long wound in  the left lateral leg. No debridement required. He also has wounds on the plantar aspect of his left first second toe web space and on the dorsal aspect of the right first second toe web space. 09/22/17; there continues to be very gradual improvements in the dimensions of  the left lateral leg wound. He hasn't round erythematous spot with might be pressure on his wheelchair. There is no evidence obviously of infection no purulence no warmth He has [name] dry scaled area on the plantar aspect of the left first second toe Improved area on the dorsal right first second toe. 09/29/17; left lateral leg wound continues to improve in dimensions mostly with an is still [name] fairly long but increasingly narrow wound. He has [name] dry scaled area on the plantar aspect of his left first second toe web space Increasingly concerning area on the dorsal right first second toe. In fact I am concerned today about possible cellulitis around this wound. The areas extending up his second toe and although there is deformities here almost appears to abut on the nailbed. 10/06/17; left lateral leg wound continues to make very gradual progress. Tissue culture I did from the right first second toe dorsal foot last time grew MRSA and enterococcus which was vancomycin sensitive. This was not sensitive to clindamycin or doxycycline. He is allergic to Zyvox and sulfa we have therefore arrange for him to have dalvance infusion tomorrow. He is had this in the past and tolerated it well 10/20/17; left lateral leg wound continues to make decent progress. This is certainly reduced in terms of with there is advancing epithelialization.The cellulitis in the right foot looks better although he still has [name] deep wound in the dorsal aspect of the first second toe web space. Plantar left first toe web space on the left I think is making some progress 10/27/17; left lateral leg wound continues to make decent progress. Advancing epithelialization.using Hydrofera Blue The right first second toe web space wound is better-looking using silver alginate Improvement in the left plantar first second toe web space. Again using silver alginate 11/03/17 left lateral leg wound continues to make decent progress albeit slowly. Using  [facility] The right per second toe web space continues to be [name] very problematic looking punched out wound. I obtained [name] piece of tissue for deep culture I did extensively treated this for fungus. It is difficult to imagine that this is [name] pressure area as the patient states other than going outside he doesn't really wear shoes at home The left plantar first second toe web space looked fairly senescent. Necrotic edges. This required debridement change to [facility] Blue to all wound areas 11/10/17; left lateral leg wound continues to contract. Using Hydrofera Blue On the right dorsal first second toe web space dorsally. Culture I did of this area last week grew MRSA there is not an easy oral option in this patient was multiple antibiotic allergies or intolerances. This was only [name] rare culture isolate I'[name] therefore going to use Bactroban under silver alginate On the left plantar first second toe web space. Debridement is required here. This is also unchanged 11/17/17; left lateral leg wound continues to contract using Hydrofera Blue this is no longer the major issue. The major concern here is the right first second toe web space. He now has an open area going from dorsally to the plantar aspect. There is now wound on the inner lateral part of the first toe. Not [name] very viable surface on this. There  is erythema spreading medially into the forefoot. No major change in the left first second toe plantar wound 11/24/17; left lateral leg wound continues to contract using Hydrofera Blue. Nice improvement today The right first second toe web space all of this looks [name] lot less angry than last week. I have given him clindamycin and topical Bactroban for MRSA and terbinafine for the possibility of underlining tinea pedis that I could not control with ketoconazole. Looks somewhat better The area on the plantar left first second toe web space is weeping with dried debris around the wound 12/01/17; left lateral leg  wound continues to contract he Hydrofera Blue. It is becoming thinner in terms of with nevertheless it is making good improvement. The right first second toe web space looks less angry but still [name] large necrotic-looking wounds starting on the plantar aspect of the right foot extending between the toes and now extensively on the base of the right second toe. I gave him clindamycin and topical Bactroban for MRSA anterior benefiting for the possibility of underlying tinea pedis. Not looking better today The area on the left first/second toe looks better. Debrided of necrotic debris 12/05/17* the patient was worked in urgently today because over the weekend he found blood on his incontinence bad when he woke up. He was found to have an ulcer by his wife who does most of his wound care. He came in today for [place] to look at this. He has not had [name] history of wounds in his buttocks in spite of his paraplegia. 12/08/17; seen in follow-up today at his usual appointment. He was seen earlier this week and found to have [name] new wound on his buttock. We also follow him for wounds on the left lateral leg, left first second toe web space and right first second toe web space 12/15/17; we have been using Hydrofera Blue to the left lateral leg which has improved. The right first second toe web space has also improved. Left first second toe web space plantar aspect looks stable. The left buttock has worsened using Santyl. Apparently the buttock has drainage 12/22/17; we have been using Hydrofera Blue to the left lateral leg which continues to improve now 2 small wounds separated by normal skin. He tells [place] he had [name] fever up to 100 yesterday he is prone to UTIs but has not noted anything different. He does in and out catheterizations. The area between the first and second toes today does not look good necrotic surface covered with what looks to be purulent drainage and erythema extending into the third toe. I had gotten this to  something that I thought look better last time however it is not look good today. He also has [name] necrotic surface over the buttock wound which is expanded. I thought there might be infection under here so I removed [name] lot of the surface with [name] #5 curet though nothing look like it really needed culturing. He is been using Santyl to this area 12/27/17; his original wound on the left lateral leg continues to improve using Hydrofera Blue. I gave him samples of Baxdella although he was unable to take them out of fear for an allergic reaction ["lump in his throat"].the culture I did of the purulent drainage from his second toe last week showed both enterococcus and [name] set Enterobacter I was also concerned about the erythema on the bottom of his foot although paradoxically although this looks somewhat better today. Finally his pressure ulcer on the left  buttock looks worse this is clearly now [name] stage III wound necrotic surface requiring debridement. We've been using silver alginate here. They came up today that he sleeps in [name] recliner, I'[name] not sure why but I asked him to stop this 01/03/18; his original wound we've been using Hydrofera Blue is now separated into 2 areas. Ulcer on his left buttock is better he is off the recliner and sleeping in bed Finally both wound areas between his first and second toes also looks some better 01/10/18; his original wound on the left lateral leg is now separated into 2 wounds we've been using Hydrofera Blue Ulcer on his left buttock has some drainage. There is [name] small probing site going into muscle layer superiorly.using silver alginate -He arrives today with [name] deep tissue injury on the left heel The wound on the dorsal aspect of his first second toe on the left looks [name] lot betterusing silver alginate ketoconazole The area on the first second toe web space on the right also looks [name] lot bette 01/17/18; his original wound on the left lateral leg continues to progress using  Hydrofera Blue Ulcer on his left buttock also is smaller surface healthier except for [name] small probing site going into the muscle layer superiorly. 2.4 cm of tunneling in this area DTI on his left heel we have only been offloading. Looks better than last week no threatened open no evidence of infection the wound on the dorsal aspect of the first second toe on the left continues to look like it's regressing we have only been using silver alginate and terbinafine orally The area in the first second toe web space on the right also looks to be [name] lot better using silver alginate and terbinafine I think this was prompted by tinea pedis 01/31/18; the patient was hospitalized in [place] last week apparently for [name] complicated UTI. He was discharged on cefepime he does in and out catheterizations. In the hospital he was discovered [name] I don't mild elevation of AST and ALT and the terbinafine was stopped.predictably the pressure ulcer on s his buttock looks betterusing silver alginate. The area on the left lateral leg also is better using Hydrofera Blue. The area between the first and second toes on the left better. First and second toes on the right still substantial but better. Finally the DTI on the left heel has held together and looks like it's resolving 02/07/18-he is here in follow-up evaluation for multiple ulcerations. He has new injury to the lateral aspect of the last issue [name] pressure ulcer, he states this is from adhesive removal trauma. He states he has tried multiple adhesive products with no success. All other ulcers appear stable. The left heel DTI is resolving. We will continue with same treatment plan and follow-up next week. 02/14/18; follow-up for multiple areas. He has [name] new area last week on the lateral aspect of his pressure ulcer more over the posterior trochanter. The original pressure ulcer looks quite stable has healthy granulation. 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 Almost fully epithelialized on the original superior area using Hydrofera Blue DTI on the left heel has peeled off this week to reveal [name] small superficial wound under denuded skin and subcutaneous tissue Both areas between the first and second toes look better including nothing open on the left 02/21/18; The patient's wounds on his left ischial tuberosity and posterior left greater  trochanter actually looked better. He has [name] large area of irritation around the area which I think is contact dermatitis. I am doubtful that this is fungal His original wound on the left lateral calf continues to improve we have been using Hydrofera Blue There is no open area in the left first second toe web space although there is [name] lot of thick callus The DTI on the left heel required debridement today of necrotic surface eschar and subcutaneous tissue using silver alginate Finally the area on the right first second toe webspace continues to contract using silver alginate and ketoconazole 02/28/18 Left ischial tuberosity wounds look better using silver alginate. Original wound on the left calf only has one small open area left using Hydrofera Blue DTI on the left heel required debridement mostly removing skin from around this wound surface. Using silver alginate The areas on the right first/second toe web space using silver alginate and ketoconazole 03/08/18 on evaluation today patient appears to be doing decently well as best I can tell in regard to his wounds. This is the first time that I have seen him as he generally is followed by Dr. Dellia Nims. With that being said none of his wounds appear to be infected he does have an area where there is some skin covering what appears to be [name] new wound on the left dorsal surface of his great toe. This is right at the nail bed. With that being said I do believe that debrided away some of  the excess skin can be of benefit in this regard. Otherwise he has been tolerating the dressing changes without complication. 03/14/18; patient arrives today with the multiplicity of wounds that we are following. He has not been systemically unwell Original wound on the left lateral calf now only has 2 small open areas we've been using Hydrofera Blue which should continue The deep tissue injury on the left heel requires debridement today. We've been using silver alginate The left first second toe and the right first second toe are both are reminiscence what I think was tinea pedis. Apparently some of the callus Surface between the toes was removed last week when it started draining. Purulent drainage coming from the wound on the ischial tuberosity on the left. 03/21/18-He is here in follow-up evaluation for multiple wounds. 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 He tolerated debridement. The only change to last week's recommendations is to discontinue antifungal cream between toes. He will follow-up next week 03/28/18; following up for multiple wounds;Concern this week is streaking redness and swelling in the right foot. He is going to need antibiotics for this. 03/31/18; follow-up for right foot cellulitis. Streaking redness and swelling in the right foot on 03/28/18. 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 I put him on clindamycin 300 mg every 6 and brought him in for [name] quick check. He has an open wound between his first and second toes on the right foot as [name] potential source. 04/04/18; Right foot cellulitis is resolving he is completing clindamycin. This is truly good news Left lateral calf wound which is initial wound only has one small open area inferiorly this is close to healing out. He has compression stockings. We will use Hydrofera Blue right down to the epithelialization of this Nonviable  surface on the left heel which was initially pressure with [name] DTI. We've been using Hydrofera Blue. I'[name] going to switch this back to silver alginate  Left first second toe/tinea pedis this looks better using silver alginate Right first second toe tinea pedis using silver alginate Large pressure ulcers on theLeft ischial tuberosity. Small wound here Looks better. I am uncertain about the surface over the large wound. Using silver alginate 04/11/18; Cellulitis in the right foot is resolved Left lateral calf wound which was his original wounds still has 2 tiny open areas remaining this is just about closed Nonviable surface on the left heel is better but still requires debridement Left first second toe/tinea pedis still open using silver alginate Right first second toe wound tinea pedis I asked him to go back to using ketoconazole and silver alginate Large pressure ulcers on the left ischial tuberosity this shear injury here is resolved. Wound is smaller. No evidence of infection using silver alginate 04/18/18; Patient arrives with an intense area of cellulitis in the right mid lower calf extending into the right heel area. Bright red and warm. Smaller area on the left anterior leg. He has [name] significant history of MRSA. He will definitely need antibioticsdoxycycline He now has 2 open areas on the left ischial tuberosity the original large wound and now [name] satellite area which I think was above his initial satellite areas. Not [name] wonderful surface on this satellite area surrounding erythema which looks like pressure related. His left lateral calf wound again his original wound is just about closed Left heel pressure injury still requiring debridement Left first second toe looks [name] lot better using silver alginate Right first second toe also using silver alginate and ketoconazole cream also looks better 04/20/18; the patient was worked in early today out of concerns with his cellulitis on the right leg. I  had started him on doxycycline. This was 2 days ago. His wife was concerned about the swelling in the area. Also concerned about the left buttock. He has not been systemically unwell no fever chills. No nausea vomiting or diarrhea 04/25/18; the patient's left buttock wound is continued to deteriorate he is using Hydrofera Blue. He is still completing clindamycin for the cellulitis on the right leg although all of this looks better. 05/02/18 Left buttock wound still with [name] lot of drainage and [name] very tightly adherent fibrinous necrotic surface. He has [name] deeper area superiorly The left lateral calf wound is still closed DTI wound on the left heel necrotic surface especially the circumference using Iodoflex Areas between his left first second toe and right first second toe both look better. Dorsally and the right first second toe he had [name] necrotic surface although at smaller. In using silver alginate and ketoconazole. I did [name] culture last week which was [name] deep tissue culture of the reminiscence of the open wound on the right first second toe dorsally. This grew [name] few Acinetobacter and [name] few methicillin-resistant staph aureus. Nevertheless the area actually this week looked better. I didn't feel the need to specifically address this at least in terms of systemic antibiotics. 05/09/18; wounds are measuring larger more drainage per our intake. We are using Santyl covered with alginate on the large superficial buttock wounds, Iodosorb on the left heel, ketoconazole and silver alginate to the dorsal first and second toes bilaterally. 05/16/18; The area on his left buttock better in some aspects although the area superiorly over the ischial tuberosity required an extensive debridement.using Santyl Left heel appears stable. Using Iodoflex The areas between his first and second toes are not bad however there is spreading erythema up the dorsal aspect of his left  foot this looks like cellulitis again. He is  insensate the erythema is really very brilliant.o Erysipelas He went to see an allergist days ago because he was itching part of this he had lab work done. This showed [name] white count of 15.1 with 70% neutrophils. Hemoglobin of 11.4 and [name] platelet count of 659,000. Last white count we had in Epic was [name] 2-1/2 years ago which was 25.9 but he was ill at the time. He was able to show me some lab work that was done by his primary physician the pattern is about the same. 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 But prompted me to go ahead and do x-rays of both feet and the pelvis rule out osteomyelitis. He also had [name] comprehensive metabolic panel this was reasonably normal his albumin was 3.7 liver function tests BUN/creatinine all normal 05/23/18; x-rays of both his feet from last week were negative for underlying pulmonary abnormality. The x-ray of his pelvis however showed mild irregularity in the left ischial which may represent some early osteomyelitis. The wound in the left ischial continues to get deeper clearly now exposed muscle. Each week necrotic surface material over this area. Whereas the rest of the wounds do not look so bad. The left ischial wound we have been using Santyl and calcium alginate T the left heel surface necrotic debris using Iodoflex o The left lateral leg is still healed Areas on the left dorsal foot and the right dorsal foot are about the same. There is some inflammation on the left which might represent contact dermatitis, fungal dermatitis I am doubtful cellulitis although this looks better than last week 05/30/18; CT scan done at Hospital did not show any osteomyelitis or abscess. Suggested the possibility of underlying cellulitis although I don't see [name] lot of evidence of this at the bedside The wound itself on the left buttock/upper thigh actually looks somewhat better. No debridement Left heel also looks better no debridement  continue Iodoflex Both dorsal first second toe spaces appear better using Lotrisone. Left still required debridement 06/06/18; Intake reported some purulent looking drainage from the left gluteal wound. Using Santyl and calcium alginate Left heel looks better although still [name] nonviable surface requiring debridement The left dorsal foot first/second webspace actually expanding and somewhat deeper. I may consider doing [name] shave biopsy of this area Right dorsal foot first/second webspace appears stable to improved. Using Lotrisone and silver alginate to both these areas 06/13/18 Left gluteal surface looks better. Now separated in the 2 wounds. No debridement required. Still drainage. We'll continue silver alginate Left heel continues to look better with Iodoflex continue this for at least another week Of his dorsal foot wounds the area on the left still has some depth although it looks better than last week. We've been using Lotrisone and silver alginate 06/20/18 Left gluteal continues to look better healthy tissue Left heel continues to look better healthy granulation wound is smaller. He is using Iodoflex and his long as this continues continue the Iodoflex Dorsal right foot looks better unfortunately dorsal left foot does not. There is swelling and erythema of his forefoot. He had minor trauma to this several days ago but doesn't think this was enough to have caused any tissue injury. Foot looks like cellulitis, we have had this problem before 06/27/18 on evaluation today patient appears to be doing [name] little worse in regard to his foot ulcer. Unfortunately it does appear that he has  methicillin-resistant staph aureus and unfortunately there really are no oral options for him as he's allergic to sulfa drugs as well as I box. Both of which would really be his only options for treating this infection. In the past he has been given and effusion of Orbactiv. This is done very well for him in the past again  it's one time dosing IV antibiotic therapy. Subsequently I do believe this is something we're gonna need to see about doing at this point in time. Currently his other wounds seem to be doing somewhat better in my pinion I'[name] pretty happy in that regard. 07/03/18 on evaluation today patient's wounds actually appear to be doing fairly well. He has been tolerating the dressing changes without complication. All in all he seems to be showing signs of improvement. In regard to the antibiotics he has been dealing with infectious disease since I saw him last week as far as getting this scheduled. In the end [facility]'s [facility] this coming Friday. In the meantime he has been continuing to perform the dressing changes in such as previous. There does not appear to be any evidence of infection worsengin at this time. 07/10/18; Since I last saw this man 2 weeks ago things have actually improved. IV antibiotics of resulted in less forefoot erythema although there is still some present. He is not systemically unwell Left buttock wounds 2 now have no depth there is increased epithelialization Using silver alginate Left heel still requires debridement using Iodoflex Left dorsal foot still with [name] sizable wound about the size of [name] border but healthy granulation Right dorsal foot still with [name] slitlike area using silver alginate 07/18/18; the patient's cellulitis in the left foot is improved in fact I think it is on its way to resolving. Left buttock wounds 2 both look better although the larger one has hypertension granulation we've been using silver alginate Left heel has some thick circumferential redundant skin over the wound edge which will need to be removed today we've been using Iodoflex Left dorsal foot is still [name] sizable wound required debridement using silver alginate The right dorsal foot is just about closed only [name] small open area remains here 07/25/18;  left foot cellulitis is resolved Left buttock wounds 2 both look better. Hyper-granulation on the major area Left heel as some debris over the surface but otherwise looks [name] healthier wound. Using silver collagen Right dorsal foot is just about closed 07/31/18; arrives with our intake nurse worried about purulent drainage from the buttock. We had hyper-granulation here last week His buttock wounds 2 continue to look better Left heel some debris over the surface but measuring smaller. Right dorsal foot unfortunately has openings between the toes Left foot superficial wound looks less aggravated. 08/07/18 Buttock wounds continue to look better although some of her granulation and the larger medial wound. silver alginate Left heel continues to look [name] lot better.silver collagen Left foot superficial wound looks less stable. Requires debridement. He has [name] new wound superficial area on the foot on the lateral dorsal foot. Right foot looks better using silver alginate without Lotrisone 08/14/2018; patient was in the ER last week diagnosed with [name] UTI. He is now on Cefpodoxime and Macrodantin. Buttock wounds continued to be smaller. Using silver alginate Left heel continues to look better using silver collagen Left foot superficial wound looks as though it is improving Right dorsal foot area is just about healed. 08/21/2018;  patient is completed his antibiotics for his UTI. He has 2 open areas on the buttocks. There is still not closed although the surface looks satisfactory. Using silver alginate Left heel continues to improve using silver collagen The bilateral dorsal foot areas which are at the base of his first and second toes/possible tinea pedis are actually stable on the left but worse on the right. The area on the left required debridement of necrotic surface. After debridement I obtained [name] specimen for PCR culture. The right dorsal foot which is been just about healed last week is now  reopened 08/28/2018; culture done on the left dorsal foot showed coag negative staph both staph epidermidis and Lugdunensis. I think this is worthwhile initiating systemic treatment. I will use doxycycline given his long list of allergies. The area on the left heel slightly improved but still requiring debridement. The large wound on the buttock is just about closed whereas the smaller one is larger. Using silver alginate in this area 09/04/2018; patient is completing his doxycycline for the left foot although this continues to be [name] very difficult wound area with very adherent necrotic debris. We are using silver alginate to all his wounds right foot left foot and the small wounds on his buttock, silver collagen on the left heel. 09/11/2018; once again this patient has intense erythema and swelling of the left forefoot. Lesser degrees of erythema in the right foot. He has [name] long list of allergies and intolerances. I will reinstitute doxycycline. 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 Using silver alginate Left heel also looks better using silver collagen Unfortunately both the areas on his feet look worse. The area on the left first second webspace is now gone through to the plantar part of his foot. The area on the left foot anteriorly is irritated with erythema and swelling in the forefoot. 09/25/2018 His wound on the left plantar heel looks better. Using silver collagen The area on the left buttock 2 small remnant areas. One is closed one is still open. Using silver alginate The areas between both his first and second toes look worse. This in spite of long-standing antifungal therapy with ketoconazole and silver alginate which should have antifungal activity He has small areas around his original wound on the left calf one is on the bottom of the original scar tissue and one superiorly both of these are small and superficial but again given wound  history in this site this is worrisome 10/02/2018 Left plantar heel continues to gradually contract using silver collagen Left buttock wound is unchanged using silver alginate The areas on his dorsal feet between his first and second toes bilaterally look about the same. I prescribed clindamycin ointment to see if we can address chronic staph colonization and also the underlying possibility of erythrasma The left lateral lower extremity wound is actually on the lateral part of his ankle. Small open area here. We have been using silver alginate 10/09/2018; Left plantar heel continues to look healthy and contract. No debridement is required Left buttock slightly smaller with [name] tape injury wound just below which was new this week Dorsal feet somewhat improved I have been using clindamycin Left lateral looks lower extremity the actual open area looks worse although [name] lot of this is epithelialized. I am going to change to silver collagen today He has [name] lot more swelling in the right leg although this is not pitting not red and not  particularly warm there is [name] lot of spasm in the right leg usually indicative of people with paralysis of some underlying discomfort. We have reviewed his vascular status from 2017 he had [name] left greater saphenous vein ablation. I wonder about referring him back to vascular surgery if the area on the left leg continues to deteriorate. 10/16/2018 in today for follow-up and management of multiple lower extremity ulcers. His left Buttock wound is much lower smaller and almost closed completely. The wound to the left ankle has began to reopen with Epithelialization and some adherent slough. He has multiple new areas to the left foot and leg. The left dorsal foot without much improvement. Wound present between left great webspace and 2nd toe. Erythema and edema present right leg. Right LE ultrasound obtained on 10/10/18 was negative for DVT . 10/23/2018; Left buttock is closed  over. Still dry macerated skin but there is no open wound. I suspect this is chronic pressure/moisture Left lateral calf is quite [name] bit worse than when I saw this last. There is clearly drainage here he has macerated skin into the left plantar heel. We will change the primary dressing to alginate Left dorsal foot has some improvement in overall wound area. Still using clindamycin and silver alginate Right dorsal foot about the same as the left using clindamycin and silver alginate The erythema in the right leg has resolved. He is DVT rule out was negative Left heel pressure area required debridement although the wound is smaller and the surface is health 10/26/2018 The patient came back in for his nurse check today predominantly because of the drainage coming out of the left lateral leg with [name] recent reopening of his original wound on the left lateral calf. He comes in today with [name] large amount of surrounding erythema around the wound extending from the calf into the ankle and even in the area on the dorsal foot. He is not systemically unwell. He is not febrile. Nevertheless this looks like cellulitis. We have been using silver alginate to the area. I changed him to [name] regular visit and I am going to prescribe him doxycycline. The rationale here is [name] long list of medication intolerances and [name] history of MRSA. I did not see anything that I thought would provide [name] valuable culture 10/30/2018 Follow-up from his appointment 4 days ago with really an extensive area of cellulitis in the left calf left lateral ankle and left dorsal foot. I put him on doxycycline. He has [name] long list of medication allergies which are true allergy reactions. 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 In any case he is [name] lot better today. The erythema especially in the anterior and lateral left calf is better. He still has left ankle erythema. He also is  complaining about increasing edema in the right leg we have only been using Kerlix Coban and he has been doing the wraps at home. Finally he has [name] spotty rash on the medial part of his upper left calf which looks like folliculitis or perhaps wrap occlusion type injury. Small superficial macules not pustules 11/06/18 patient arrives today with again [name] considerable degree of erythema around the wound on the left lateral calf extending into the dorsal ankle and dorsal foot. This is [name] lot worse than when I saw this last week. He is on doxycycline really with not [name] lot of improvement. He has not been systemically unwell Wounds on the; left  heel actually looks improved. Original area on the left foot and proximity to the first and second toes looks about the same. He has superficial areas on the dorsal foot, anterior calf and then the reopening of his original wound on the left lateral calf which looks about the same The only area he has on the right is the dorsal webspace first and second which is smaller. He has [name] large area of dry erythematous skin on the left buttock small open area here. 11/13/2018; the patient arrives in much better condition. The erythema around the wound on the left lateral calf is [name] lot better. Not sure whether this was the clindamycin or the TCA and ketoconazole or just in the improvement in edema control [stasis dermatitis]. In any case this is [name] lot better. The area on the left heel is very small and just about resolved using silver collagen we have been using silver alginate to the areas on his dorsal feet 11/20/2018; his wounds include the left lateral calf, left heel, dorsal aspects of both feet just proximal to the first second webspace. He is stable to slightly improved. I did not think any changes to his dressings were going to be necessary 11/27/2018 he has [name] reopening on the left buttock which is surrounded by what looks like tinea or perhaps some other form of dermatitis.  The area on the left dorsal foot has some erythema around it I have marked this area but I am not sure whether this is cellulitis or not. Left heel is not closed. Left calf the reopening is really slightly longer and probably worse 1/13; in general things look better and smaller except for the left dorsal foot. Area on the left heel is just about closed, left buttock looks better only [name] small wound remains in the skin looks better [using Lotrisone] 1/20; the area on the left heel only has [name] few remaining open areas here. Left lateral calf about the same in terms of size, left dorsal foot slightly larger right lateral foot still not closed. The area on the left buttock has no open wound and the surrounding skin looks [name] lot better 1/27; the area on the left heel is closed. Left lateral calf better but still requiring extensive debridements. The area on his left buttock is closed. He still has the open areas on the left dorsal foot which is slightly smaller in the right foot which is slightly expanded. We have been using Iodoflex on these areas as well 2/3; left heel is closed. Left lateral calf still requiring debridement using Iodoflex there is no open area on his left buttock however he has dry scaly skin over [name] large area of this. Not really responding well to the Lotrisone. Finally the areas on his dorsal feet at the level of the first second webspace are slightly smaller on the right and about the same on the left. Both of these vigorously debrided with Anasept and gauze 2/10; left heel remains closed he has dry erythematous skin over the left buttock but there is no open wound here. Left lateral leg has come in and with. Still requiring debridement we have been using Iodoflex here. Finally the area on the left dorsal foot and right dorsal foot are really about the same extremely dry callused fissured areas. He does not yet have [name] dermatology appointment 2/17; left heel remains closed. He has [name] new  open area on the left buttock. The area on the left lateral calf is bigger  longer and still covered in necrotic debris. No major change in his foot areas bilaterally. I am awaiting for [name] dermatologist to look on this. We have been using ketoconazole I do not know that this is been doing any good at all. 2/24; left heel remains closed. The left buttock wound that was new reopening last week looks better. The left lateral calf appears better also although still requires debridement. The major area on his foot is the left first second also requiring debridement. We have been putting Prisma on all wounds. I do not believe that the ketoconazole has done too much good for his feet. He will use Lotrisone I am going to give him [name] 2-week course of terbinafine. We still do not have [name] dermatology appointment 3/2 left heel remains closed however there is skin over bone in this area I pointed this out to him today. The left buttock wound is epithelialized but still does not look completely stable. The area on the left leg required debridement were using silver collagen here. With regards to his feet we changed to Lotrisone last week and silver alginate. 3/9; left heel remains closed. Left buttock remains closed. The area on the right foot is essentially closed. The left foot remains unchanged. Slightly smaller on the left lateral calf. Using silver collagen to both of these areas 3/16-Left heel remains closed. Area on right foot is closed. Left lateral calf above the lateral malleolus open wound requiring debridement with easy bleeding. Left dorsal wound proximal to first toe also debrided. Left ischial area open new. Patient has been using Prisma with wrapping every 3 days. Dermatology appointment is apparently tomorrow.Patient has completed his terbinafine 2-week course with some apparent improvement according to him, there is still flaking and dry skin in his foot on the left 3/23; area on the right foot is  reopened. The area on the left anterior foot is about the same still [name] very necrotic adherent surface. He still has the area on the left leg and reopening is on the left buttock. He apparently saw dermatology although I do not have [name] note. According to the patient who is usually fairly well informed they did not have any good ideas. Put him on oral terbinafine which she is been on before. 3/30; using silver collagen to all wounds. Apparently his dermatologist put him on doxycycline and rifampin presumably some culture grew staph. I do not have this result. He remains on terbinafine although I have used terbinafine on him before 4/6; patient has had [name] fairly substantial reopening on the right foot between the first and second toes. He is finished his terbinafine and I believe is on doxycycline and rifampin still as prescribed by dermatology. We have been using silver collagen to all his wounds although the patient reports that he thinks silver alginate does better on the wounds on his buttock. 4/13; the area on his left lateral calf about the same size but it did not require debridement. Left dorsal foot just proximal to the webspace between the first and second toes is about the same. Still nonviable surface. I note some superficial bronze discoloration of the dorsal part of his foot Right dorsal foot just proximal to the first and second toes also looks about the same. I still think there may be the same discoloration I noted above on the left Left buttock wound looks about the same 4/20; left lateral calf appears to be gradually contracting using silver collagen. He remains on erythromycin empiric treatment  for possible erythrasma involving his digital spaces. The left dorsal foot wound is debrided of tightly adherent necrotic debris and really cleans up quite nicely. The right area is worse with expansion. I did not debride this it is now over the base of the second toe The area on his left  buttock is smaller no debridement is required using silver collagen 5/4; left calf continues to make good progress. He arrives with erythema around the wounds on his dorsal foot which even extends to the plantar aspect. Very concerning for coexistent infection. He is finished the erythromycin I gave him for possible erythrasma this does not seem to have helped. The area on the left foot is about the same base of the dorsal toes Is area on the buttock looks improved on the left 5/11; left calf and left buttock continued to make good progress. Left foot is about the same to slightly improved. Major problem is on the right foot. He has not had an x-ray. Deep tissue culture I did last week showed both Enterobacter and E. coli. I did not change the doxycycline I put him on empirically although neither 1 of these were plated to doxycycline. He arrives today with the erythema looking worse on both the dorsal and plantar foot. Macerated skin on the bottom of the foot. he has not been systemically unwell 5/18-Patient returns at 1 week, left calf wound appears to be making some progress, left buttock wound appears slightly worse than last time, left foot wound looks slightly better, right foot redness is marginally better. X-ray of both feet show no air or evidence of osteomyelitis. Patient is finished his Omnicef and terbinafine. He continues to have macerated skin on the bottom of the left foot as well as right 5/26; left calf wound is better, left buttock wound appears to have multiple small superficial open areas with surrounding macerated skin. X-rays that I did last time showed no evidence of osteomyelitis in either foot. He is finished cefdinir and doxycycline. I do not think that he was on terbinafine. He continues to have [name] large superficial open area on the right foot anterior dorsal and slightly between the first and second toes. I did send him to dermatology 2 months ago or so wondering about  whether they would do [name] fungal scraping. I do not believe they did but did do [name] culture. We have been using silver alginate to the toe areas, he has been using antifungals at home topically either ketoconazole or Lotrisone. We are using silver collagen on the left foot, silver alginate on the right, silver collagen on the left lateral leg and silver alginate on the left buttock 6/1; left buttock area is healed. We have the left dorsal foot, left lateral leg and right dorsal foot. We are using silver alginate to the areas on both feet and silver collagen to the area on his left lateral calf 6/8; the left buttock apparently reopened late last week. He is not really sure how this happened. He is tolerating the terbinafine. Using silver alginate to all wounds 6/15; left buttock wound is larger than last week but still superficial. Came in the clinic today with [name] report of purulence from the left lateral leg I did not identify any infection Both areas on his dorsal feet appear to be better. He is tolerating the terbinafine. Using silver alginate to all wounds 6/22; left buttock is about the same this week, left calf quite [name] bit better. His left foot is about  the same however he comes in with erythema and warmth in the right forefoot once again. Culture that I gave him in the beginning of May showed Enterobacter and E. coli. I gave him doxycycline and things seem to improve although neither 1 of these organisms was specifically plated. 6/29; left buttock is larger and dry this week. Left lateral calf looks to me to be improved. Left dorsal foot also somewhat improved right foot completely unchanged. The erythema on the right foot is still present. He is completing the Ceftin dinner that I gave him empirically [see discussion above.) 7/6 - All wounds look to be stable and perhaps improved, the left buttock wound is slightly smaller, per patient bleeds easily, completed ceftin, the right foot redness is  less, he is on terbinafine 7/13; left buttock wound about the same perhaps slightly narrower. Area on the left lateral leg continues to narrow. Left dorsal foot slightly smaller right foot about the same. We are using silver alginate on the right foot and Hydrofera Blue to the areas on the left. Unna boot on the left 2 layer compression on the right 7/20; left buttock wound absolutely the same. Area on lateral leg continues to get better. Left dorsal foot require debridement as did the right no major change in the 7/27; left buttock wound the same size necrotic debris over the surface. The area on the lateral leg is closed once again. His left foot looks better right foot about the same although there is some involvement now of the posterior first second toe area. He is still on terbinafine which I have given him for [name] month, not certain [name] centimeter major change 06/25/19-All wounds appear to be slightly improved according to report, left buttock wound looks clean, both foot wounds have minimal to no debris the right dorsal foot has minimal slough. We are using Hydrofera Blue to the left and silver alginate to the right foot and ischial wound. 8/10-Wounds all appear to be around the same, the right forefoot distal part has some redness which was not there before, however the wound looks clean and small. Ischial wound looks about the same with no changes 8/17; his wound on the left lateral calf which was his original chronic venous insufficiency wound remains closed. Since I last saw him the areas on the left dorsal foot right dorsal foot generally appear better but require debridement. The area on his left initial tuberosity appears somewhat larger to me perhaps hyper granulated and bleeds very easily. We have been using Hydrofera Blue to the left dorsal foot and silver alginate to everything else 8/24; left lateral calf remains closed. The areas on his dorsal feet on the webspace of the first and  second toes bilaterally both look better. The area on the left buttock which is the pressure ulcer stage II slightly smaller. I change the dressing to Hydrofera Blue to all areas 8/31; left lateral calf remains closed. The area on his dorsal feet bilaterally look better. Using Hydrofera Blue. Still requiring debridement on the left foot. No change in the left buttock pressure ulcers however 9/14; left lateral calf remains closed. Dorsal feet look quite [name] bit better than 2 weeks ago. Flaking dry skin also [name] lot better with the ammonium lactate I gave him 2 weeks ago. The area on the left buttock is improved. He states that his Roho cushion developed [name] leak and he is getting [name] new one, in the interim he is offloading this vigorously 9/21; left calf  remains closed. Left heel which was [name] possible DTI looks better this week. He had macerated tissue around the left dorsal foot right foot looks satisfactory and improved left buttock wound. I changed his dressings to his feet to silver alginate bilaterally. Continuing Hydrofera Blue on the left buttock. 9/28 left calf remains closed. Left heel did not develop anything [possible DTI] dry flaking skin on the left dorsal foot. Right foot looks satisfactory. Improved left buttock wound. We are using silver alginate on his feet Hydrofera Blue on the buttock. I have asked him to go back to the Lotrisone on his feet including the wounds and surrounding areas 10/5; left calf remains closed. The areas on the left and right feet about the same. [name] lot of this is epithelialized however debris over the remaining open areas. He is using Lotrisone and silver alginate. The area on the left buttock using Hydrofera Blue 10/26. Patient has been out for 3 weeks secondary to Covid concerns. He tested negative but I think his wife tested positive. He comes in today with the left foot substantially worse, right foot about the same. Even more concerning he states that the area on  his left buttock closed over but then reopened and is considerably deeper in one aspect than it was before [stage III wound] 11/2; left foot really about the same as last week. Quarter sized wound on the dorsal foot just proximal to the first second toes. Surrounding erythema with areas of denuded epithelium. This is not really much different looking. Did not look like cellulitis this time however. Right foot area about the same.. We have been using silver alginate alginate on his toes Left buttock still substantial irritated skin around the wound which I think looks somewhat better. We have been using Hydrofera Blue here. 11/9; left foot larger than last week and [name] very necrotic surface. Right foot I think is about the same perhaps slightly smaller. Debris around the circumference also addressed. Unfortunately on the left buttock there is been [name] decline. Satellite lesions below the major wound distally and now [name] an additional one posteriorly we have been using Hydrofera Blue but I think this is [name] pressure issue 11/16; left foot ulcer dorsally again [name] very adherent necrotic surface. Right foot is about the same. Not much change in the pressure ulcer on his left buttock. 11/30; left foot ulcer dorsally basically the same as when I saw him 2 weeks ago. Very adherent fibrinous debris on the wound surface. Patient reports [name] lot of drainage as well. 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 We have been using Iodoflex, patient changed back to alginate because of the drainage. Area on his right dorsal foot really looks benign with [name] healthier surface certainly [name] lot better than on the left. Left buttock wounds all improved using Hydrofera Blue 12/7; left dorsal foot again no improvement. Tightly adherent debris. PCR culture I did last week only showed likely skin contaminant. I have gone ahead and done [name] punch biopsy of this which is about the last thing in  terms of investigations I can think to do. He has known venous insufficiency and venous hypertension and this could be the issue here. The area on the right foot is about the same left buttock slightly worse according to our intake nurse secondary to [facility] Blue sticking to the wound 12/14; biopsy of the left foot that I did last time showed changes that could be related to  wound healing/chronic stasis dermatitis phenomenon no neoplasm. We have been using silver alginate to both feet. I change the one on the left today to Sorbact and silver alginate to his other 2 wounds 12/28; the patient arrives with the following problems; Major issue is the dorsal left foot which continues to be [name] larger deeper wound area. Still with [name] completely nonviable surface Paradoxically the area mirror image on the right on the right dorsal foot appears to be getting better. He had some loss of dry denuded skin from the lower part of his original wound on the left lateral calf. Some of this area looked [name] little vulnerable and for this reason we put him in wrap that on this side this week The area on his left buttock is larger. He still has the erythematous circular area which I think is [name] combination of pressure, sweat. This does not look like cellulitis or fungal dermatitis 11/26/2019; -Dorsal left foot large open wound with depth. Still debris over the surface. Using Sorbact The area on the dorsal right foot paradoxically has closed over He has [name] reopening on the left ankle laterally at the base of his original wound that extended up into the calf. This appears clean. The left buttock wound is smaller but with very adherent necrotic debris over the surface. We have been using silver alginate here as well The patient had arterial studies done in 2017. He had biphasic waveforms at the dorsalis pedis and posterior tibial bilaterally. ABI in the left was 1.17. Digit waveforms were dampened. He has slight spasticity in  the great toes I do not think [name] TBI would be possible 1/11; the patient comes in today with [name] sizable reopening between the first and second toes on the right. This is not exactly in the same location where we have been treating wounds previously. According to our intake nurse this was actually fairly deep but 0.6 cm. The area on the left dorsal foot looks about the same the surface is somewhat cleaner using Sorbact, his MRI is in 2 days. We have not managed yet to get arterial studies. The new reopening on the left lateral calf looks somewhat better using alginate. The left buttock wound is about the same using alginate 1/18; the patient had his ARTERIAL studies which were quite normal. ABI in the right at 1.13 with triphasic/biphasic waveforms on the left ABI 1.06 again with triphasic/biphasic waveforms. It would not have been possible to have done [name] toe brachial index because of spasticity. We have been using Sorbac to the left foot alginate to the rest of his wounds on the right foot left lateral calf and left buttock 1/25; arrives in clinic with erythema and swelling of the left forefoot worse over the first MTP area. This extends laterally dorsally and but also posteriorly. Still has an area on the left lateral part of the lower part of his calf wound it is eschared and clearly not closed. Area on the left buttock still with surrounding irritation and erythema. Right foot surface wound dorsally. The area between the right and first and second toes appears better. 2/1; The left foot wound is about the same. Erythema slightly better I gave him [name] week of doxycycline empirically Right foot wound is more extensive extending between the toes to the plantar surface Left lateral calf really no open surface on the inferior part of his original wound however the entire area still looks vulnerable Absolutely no improvement in the left buttock wound required  debridement. 2/8; the left foot is about the  same. Erythema is slightly improved I gave him clindamycin last week. Right foot looks better he is using Lotrimin and silver alginate He has [name] breakdown in the left lateral calf. Denuded epithelium which I have removed Left buttock about the same were using Hydrofera Blue 2/15; left foot is about the same there is less surrounding erythema. Surface still has tightly adherent debris which I have debriding however not making any progress Right foot has [name] substantial wound on the medial right second toe between the first and second webspace. Still an open area on the left lateral calf distal area. Buttock wound is about the same 2/22; left foot is about the same less surrounding erythema. Surface has adherent debris. Polymen Ag Right foot area significant wound between the first and second toes. We have been using silver alginate here Left lateral leg polymen Ag at the base of his original venous insufficiency wound Left buttock some improvement here 3/1; Right foot is deteriorating in the first second toe webspace. Larger and more substantial. We have been using silver alginate. Left dorsal foot about the same markedly adherent surface debris using PolyMem Ag Left lateral calf surface debris using PolyMem AG Left buttock is improved again using PolyMem Ag. He is completing his terbinafine. The erythema in the foot seems better. He has been on this for 2 weeks 3/8; no improvement in any wound area in fact he has [name] small open area on the dorsal midfoot which is new this week. He has not gotten his foot x-rays yet 3/15; his x-rays were both negative for osteomyelitis of both feet. No major change in any of his wounds on the extremities however his buttock wounds are better. We have been using polymen on the buttocks, left lower leg. Iodoflex on the left foot and silver alginate on the right 3/22; arrives in clinic today with the 2 major issues are the improvement in the left dorsal foot wound which  for once actually looks healthy with [name] nice healthy wound surface without debridement. Using Iodoflex here. Unfortunately on the left lateral calf which is in the distal part of his original wound he came to the clinic here for there was purulent drainage noted some increased breakdown scattered around the original area and [name] small area proximally. We we are using polymen here will change to silver alginate today. His buttock wound on the left is better and I think the area on the right first second toe webspace is also improved 3/29; left dorsal foot looks better. Using Iodoflex. Left ankle culture from deterioration last time grew E. coli, Enterobacter and Enterococcus. I will give him [name] course of cefdinir although that will not cover Enterococcus. The area on the right foot in the webspace of the first and second toe lateral first toe looks better. The area on his buttock is about healed Vascular appointment is on April 21. This is to look at his venous system vis--vis continued breakdown of the wounds on the left including the left lateral leg and left dorsal foot he. He has had previous ablations on this side 4/5; the area between the right first and second toes lateral aspect of the first toe looks better. Dorsal aspect of the left first toe on the left foot also improved. Unfortunately the left lateral lower leg is larger and there is [name] second satellite wound superiorly. The usual superficial abrasions on the left buttock overall better but certainly not closed  4/12; the area between the right first and second toes is improved. Dorsal aspect of the left foot also slightly smaller with [name] vibrant healthy looking surface. No real change in the left lateral leg and the left buttock wound is healed He has an unaffordable co-pay for Apligraf. Appointment with vein and vascular with regards to the left leg venous part of the circulation is on 4/21 4/19; we continue to see improvement in all wound  areas. Although this is minor. He has his vascular appointment on 4/21. The area on the left buttock has not reopened although right in the center of this area the skin looks somewhat threatened 4/26; the left buttock is unfortunately reopened. In general his left dorsal foot has [name] healthy surface and looks somewhat smaller although it was not measured as such. The area between his first and second toe webspace on the right as [name] small wound against the first toe. The patient saw vascular surgery. The real question I was asking was about the small saphenous vein on the left. He has previously ablated left greater saphenous vein. Nothing further was commented on on the left. Right greater saphenous vein without reflux at the saphenofemoral junction or proximal thigh there was no indication for ablation of the right greater saphenous vein duplex was negative for DVT bilaterally. They did not think there was anything from [name] point of view that could be offered. They ABIs within normal limits 5/3; only small open area on the left buttock. The area on the left lateral leg which was his original venous reflux is now 2 wounds both which look clean. We are using Iodoflex on the left dorsal foot which looks healthy and smaller. He is down to [name] very tiny area between the right first and second toes, using silver alginate 5/10; all of his wounds appear better. We have much better edema control in 4 layer compression on the left. This may be the factor that is allowing the left foot and left lateral calf to heal. He has external compression garments at home 04/14/20-All of his wounds are progressing well, the left forefoot is practically closed, left ischium appears to be about the same, right toe webspace is also smaller. The left lateral leg is about the same, continue using Hydrofera Blue to this, silver alginate to the ischium, Iodoflex to the toe space on the right 6/7; most of his wounds  outside of the left buttock are doing well. The area on the left lateral calf and left dorsal foot are smaller. The area on the right foot in between the first and second toe webspace is barely visible although he still says there is some drainage here is the only reason I did not heal this out. Unfortunately the area on the left buttock almost looks like he has [name] skin tear from tape. He has open wound and then [name] large flap of skin that we are trying to get adherence over an area just next to the remaining wound 6/21; 2 week follow-up. I believe is been here for nurse visits. Miraculously the area between his first and second toes on the left dorsal foot is closed over. Still open on the right first second web space. The left lateral calf has 2 open areas. Distally this is more superficial. The proximal area had [name] little more depth and required debridement of adherent necrotic material. His buttock wound is actually larger we have been using silver alginate here 6/28; the patient's area  on the left foot remains closed. Still open wet area between the first and second toes on the right and also extending into the plantar aspect. We have been using silver alginate in this location. He has 2 areas on the left lower leg part of his original long wounds which I think are better. We have been using Hydrofera Blue here. Hydrofera Blue to the left buttock which is stable 7/12; left foot remains closed. Left ankle is closed. May be [name] small area between his right first and second toes the only truly open area is on the left buttock. We have been using Hydrofera Blue here 7/19; patient arrives with marked deterioration especially in the left foot and ankle. We did not put him in [name] compression wrap on the left last week in fact he wore his juxta lite stockings on either side although he does not have an underlying stocking. He has [name] reopening on the left dorsal foot, left lateral ankle and [name] new area on the right  dorsal ankle. More worrisome is the degree of erythema on the left foot extending on the lateral foot into the lateral lower leg on the left 7/26; the patient had erythema and drainage from the lateral left ankle last week. Culture of this grew MRSA resistant to doxycycline and clindamycin which are the 2 antibiotics we usually use with this patient who has multiple antibiotic allergies including linezolid, trimethoprim sulfamethoxazole. I had give him an empiric doxycycline and he comes in the area certainly looks somewhat better although it is blotchy in his lower leg. He has not been systemically unwell. He has had areas on the left dorsal foot which is [name] reopening, chronic wounds on the left lateral ankle. Both of these I think are secondary to chronic venous insufficiency. The area between his first and second toes is closed as far as I can tell. He had [name] new wrap injury on the right dorsal ankle last week. Finally he has an area on the left buttock. We have been using silver alginate to everything except the left buttock we are using Hydrofera Blue Electronic Signature(s) Signed: 06/16/2020 6:00:25 PM By: Linton Ham MD Entered By: Linton Ham on 06/16/2020 08:44:03 -------------------------------------------------------------------------------- Physical Exam Details Patient Name: Date of Service: [name] [name] [name] [name] 7:30 [name] [name] Medical Record Number: [id number] Patient Account Number: [id number] Date of Birth/Sex: Treating RN: [birth year] (34 y.o. [name] [name] Primary Care Provider: Winder, [place] Other Clinician: Referring Provider: Treating Provider/Extender: [name]: 232 Constitutional Sitting or standing Blood Pressure is within target range for patient.. Pulse regular and within target range for patient.Marland Kitchen Respirations regular, non-labored and within target range.. Temperature is normal and within the target range for the  patient.Marland Kitchen Appears in no distress. Respiratory work of breathing is normal. Integumentary (Hair, Skin) The erythema I marked last time seems somewhat better.. Notes Wound exam Left ischial tuberosity looks better. Still the dry irritated skin around the wound. This is not tinea. Left lateral ankle areas generally look better. Left dorsal foot debrided of surface material skin and subcutaneous tissue small open wound. This is [name] reopening of [name] very refractory chronic wound he had. I cannot visualize [name] wound between the first and second toes most of this on the lateral part of the first toe. I think it is epithelialized but between his spasticity and the difficulty of looking in this area I am uncertain. The injury at the top of his right ankle also  requires debridement with [name] #3 curette of necrotic surface debris. Hemostasis with direct pressure Electronic Signature(s) Signed: 06/16/2020 6:00:25 PM By: Linton Ham MD Entered By: Linton Ham on 06/16/2020 08:47:38 -------------------------------------------------------------------------------- Physician Orders Details Patient Name: Date of Service: [name] [name] [name] [name] 7:30 [name] [name] Medical Record Number: [id number] Patient Account Number: [id number] Date of Birth/Sex: Treating RN: 06-Oct-1986 (34 y.o. [name] [name] Primary Care Provider: Other Clinician: Janine Limbo Referring Provider: Treating Provider/Extender: [name]: [phone] Verbal / Phone Orders: No Diagnosis Coding ICD-10 Coding Code Description I87.332 Chronic venous hypertension (idiopathic) with ulcer and inflammation of left lower extremity L97.321 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 to breakdown of skin L97.311 Non-pressure chronic ulcer of right ankle limited to breakdown of skin L97.511 Non-pressure chronic ulcer of other part of right foot limited to breakdown of skin L97.521 Non-pressure chronic ulcer of  other part of left foot limited to breakdown of skin L03.116 Cellulitis of left lower limb 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 - MD visit Return [name] Nurse Visit: - 1 week Dressing Change Frequency Wound #37R Left,Lateral Malleolus Do not change entire dressing for one week. Wound #38 Right T - Web between 1st and 2nd oe Change Dressing every other day. Wound #41 Left Ischium Change Dressing every other day. Wound #42 Right,Dorsal Ankle Change Dressing every other day. Wound #43 Left,Dorsal Foot Do not change entire dressing for one week. Skin Barriers/Peri-Wound Care Moisturizing lotion - both legs Wound Cleansing May shower with protection. - use cast protector Primary Wound Dressing Wound #37R Left,Lateral Malleolus Calcium [name] lginate with Silver Other: - Mupirocin under alginate Wound #38 Right T - Web between 1st and 2nd oe Calcium Alginate with Silver Wound #41 Left Ischium Hydrofera Blue Wound #42 Right,Dorsal Ankle Calcium Alginate with Silver Wound #43 Left,Dorsal Foot Calcium Alginate with Silver Secondary Dressing Wound #37R Left,Lateral Malleolus Dry Gauze Wound #41 Left Ischium Dry Gauze ABD pad Wound #42 Right,Dorsal Ankle Kerlix/Rolled Gauze - secure with tape Dry Gauze Wound #43 Left,Dorsal Foot Dry Gauze Wound #38 Right T - Web between 1st and 2nd oe Kerlix/Rolled Gauze Dry Gauze Edema Control 4 layer compression: Left lower extremity Elevate legs to the level of the heart or above for 30 minutes daily and/or when sitting, [name] frequency of: - throughout the day Support Garment 30-40 mm/Hg pressure to: - Juxtalite to right leg daily Off-Loading Low air-loss mattress (Group 2) Roho cushion for wheelchair Turn and reposition every 2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 in which the epithelialization seems to be coming from the surface of the wound rather than in the exact circumference. There is still small open areas mostly along the lateral margin of the wound. ooHe has unchanged areas between the left first and second and the right first second toes which I been treating for tenia pedis 06/02/17; left lateral leg venous insufficiency wound. We have been using Hydrofera Blue. Somewhat smaller from the wound circumference. The surface of the wound remains [name] bit on it almost epithelialized sedation in appearance. I use an open curette today debridement in the surface of all of this especially the edges ooSmall open wounds remaining on the dorsal right first and second toe interspace and the plantar left first second toe and her face on the left 06/09/17; wound on the left lateral leg continues to be smaller but very gradual and very dry surface using Hydrofera Blue 06/16/17 requires weekly debridements now on the left lateral leg although this continues to contract. I changed to silver collagen last week because of dryness of the wound bed. Using Iodoflex to the areas on his first and second toes/web space bilaterally 06/24/17; patient with history of paraplegia also chronic venous insufficiency with lymphedema. Has [name] very difficult wound on the left lateral leg. This has been gradually reducing in terms of with but comes in with [name] very dry adherent surface. High switch to silver collagen [name] week or so ago with hydrogel to keep the area moist. This is been refractory to multiple dressing attempts. He also has areas in his first and second toes bilaterally in the anterior and posterior web space. I had been using Iodoflex here after [name]  prolonged course of silver alginate with ketoconazole was ineffective [question tinea pedis] 07/14/17; patient arrives today with [name] very difficult adherent material over his left lateral lower leg wound. He also has surrounding erythema and poorly controlled edema. He was switched his Santyl last visit which the nurses are applying once during his doctor visit and once on [name] nurse visit. He was also reduced to 2 layer compression I'[name] not exactly sure of the issue here. 07/21/17; better surface today after 1 week of Iodoflex. Significant cellulitis that we treated last week also better. [Doxycycline] 07/28/17 better surface today with now 2 weeks of Iodoflex. Significant cellulitis treated with doxycycline. He  has now completed the doxycycline and he is back to his usual degree of chronic venous inflammation/stasis dermatitis. He reminds me he has had ablations surgery here 08/04/17; continued improvement with Iodoflex to the left lateral leg wound in terms of the surface of the wound although the dimensions are better. 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 Problematic areas on the plantar aspect of the first second toe web space on the left and the dorsal aspect of the first second toe web space on the right. At one point I felt these were probably related to chronic fungal infections in treated him aggressively for this although we have not made any improvement here. 08/11/17; left lateral leg. Surface continues to improve with the Iodoflex although we are not seeing much improvement in overall wound dimensions. Areas on his plantar left foot and right foot show no improvement. In fact the right foot looks somewhat worse 08/18/17; left lateral leg. We changed to [facility] Blue last week after [name] prolonged course of Iodoflex which helps get the surface better. It appears that the wound with is improved. Continue with difficult areas on  the left dorsal first second and plantar first second on the right 09/01/17; patient arrives in clinic today having had [name] temperature of 103 yesterday. He was seen in the ER and [facility]. The patient was concerned he could have cellulitis again in the right leg however they diagnosed him with [name] UTI and he is now on Keflex. He has [name] history of cellulitis which is been recurrent and difficult but this is been in the left leg, in the past 5 use doxycycline. He does in and out catheterizations at home which are risk factors for UTI 09/08/17; patient will be completing his Keflex this weekend. The erythema on the left leg is considerably better. He has [name] new wound today on the medial part of the right leg small superficial almost looks like [name] skin tear. He has worsening of the area on the right dorsal first and second toe. His major area on the left lateral leg is better. Using Hydrofera Blue on all areas 09/15/17; gradual reduction in width on the long wound in the left lateral leg. No debridement required. He also has wounds on the plantar aspect of his left first second toe web space and on the dorsal aspect of the right first second toe web space. 09/22/17; there continues to be very gradual improvements in the dimensions of the left lateral leg wound. He hasn't round erythematous spot with might be pressure on his wheelchair. There is no evidence obviously of infection no purulence no warmth ooHe has [name] dry scaled area on the plantar aspect of the left first second toe ooImproved area on the dorsal right first second toe. 09/29/17; left lateral leg wound continues to improve in dimensions mostly with an is still [name] fairly long but increasingly narrow wound. ooHe has [name] dry scaled area on the plantar aspect of his left first second toe web space ooIncreasingly concerning area on the dorsal right first second toe. In fact I am concerned today about possible cellulitis around this wound. The areas  extending up his second toe and although there is deformities here almost appears to abut on the nailbed. 10/06/17; left lateral leg wound continues to make very gradual progress. Tissue culture I did from the right first second toe dorsal foot last time grew MRSA and enterococcus which was vancomycin sensitive.  This was not sensitive to clindamycin or doxycycline. He is allergic to Zyvox and sulfa we have therefore arrange for him to have dalvance infusion tomorrow. He is had this in the past and tolerated it well 10/20/17; left lateral leg wound continues to make decent progress. This is certainly reduced in terms of with there is advancing epithelialization.ooThe cellulitis in the right foot looks better although he still has [name] deep wound in the dorsal aspect of the first second toe web space. Plantar left first toe web space on the left I think is making some progress 10/27/17; left lateral leg wound continues to make decent progress. Advancing epithelialization.using Hydrofera Blue ooThe right first second toe web space wound is better-looking using silver alginate ooImprovement in the left plantar first second toe web space. Again using silver alginate 11/03/17 left lateral leg wound continues to make decent progress albeit slowly. Using Hydrofera Blue ooThe right per second toe web space continues to be [name] very problematic looking punched out wound. I obtained [name] piece of tissue for deep culture I did extensively treated this for fungus. It is difficult to imagine that this is [name] pressure area as the patient states other than going outside he doesn't really wear shoes at home ooThe left plantar first second toe web space looked fairly senescent. Necrotic edges. This required debridement oochange to Hydrofera Blue to all wound areas 11/10/17; left lateral leg wound continues to contract. Using Hydrofera Blue ooOn the right dorsal first second toe web space dorsally. Culture I did of this  area last week grew MRSA there is not an easy oral option in this patient was multiple antibiotic allergies or intolerances. This was only [name] rare culture isolate I'[name] therefore going to use Bactroban under silver alginate ooOn the left plantar first second toe web space. Debridement is required here. This is also unchanged 11/17/17; left lateral leg wound continues to contract using Hydrofera Blue this is no longer the major issue. ooThe major concern here is the right first second toe web space. He now has an open area going from dorsally to the plantar aspect. There is now wound on the inner lateral part of the first toe. Not [name] very viable surface on this. There is erythema spreading medially into the forefoot. ooNo major change in the left first second toe plantar wound 11/24/17; left lateral leg wound continues to contract using Hydrofera Blue. Nice improvement today ooThe right first second toe web space all of this looks [name] lot less angry than last week. I have given him clindamycin and topical Bactroban for MRSA and terbinafine for the possibility of underlining tinea pedis that I could not control with ketoconazole. Looks somewhat better ooThe area on the plantar left first second toe web space is weeping with dried debris around the wound 12/01/17; left lateral leg wound continues to contract he Hydrofera Blue. It is becoming thinner in terms of with nevertheless it is making good improvement. ooThe right first second toe web space looks less angry but still [name] large necrotic-looking wounds starting on the plantar aspect of the right foot extending between the toes and now extensively on the base of the right second toe. I gave him clindamycin and topical Bactroban for MRSA anterior benefiting for the possibility of underlying tinea pedis. Not looking better today ooThe area on the left first/second toe looks better. Debrided of necrotic debris 12/05/17* the patient was worked in urgently  today because over the weekend he found blood on his incontinence  bad when he woke up. He was found to have an ulcer by his wife who does most of his wound care. He came in today for [place] to look at this. He has not had [name] history of wounds in his buttocks in spite of his paraplegia. 12/08/17; seen in follow-up today at his usual appointment. He was seen earlier this week and found to have [name] new wound on his buttock. We also follow him for wounds on the left lateral leg, left first second toe web space and right first second toe web space 12/15/17; we have been using Hydrofera Blue to the left lateral leg which has improved. The right first second toe web space has also improved. Left first second toe web space plantar aspect looks stable. The left buttock has worsened using Santyl. Apparently the buttock has drainage 12/22/17; we have been using Hydrofera Blue to the left lateral leg which continues to improve now 2 small wounds separated by normal skin. He tells [place] he had [name] fever up to 100 yesterday he is prone to UTIs but has not noted anything different. He does in and out catheterizations. The area between the first and second toes today does not look good necrotic surface covered with what looks to be purulent drainage and erythema extending into the third toe. I had gotten this to something that I thought look better last time however it is not look good today. He also has [name] necrotic surface over the buttock wound which is expanded. I thought there might be infection under here so I removed [name] lot of the surface with [name] #5 curet though nothing look like it really needed culturing. He is been using Santyl to this area 12/27/17; his original wound on the left lateral leg continues to improve using Hydrofera Blue. I gave him samples of Baxdella although he was unable to take them out of fear for an allergic reaction ["lump in his throat"].the culture I did of the purulent drainage from his second toe last  week showed both enterococcus and [name] set Enterobacter I was also concerned about the erythema on the bottom of his foot although paradoxically although this looks somewhat better today. 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 We've been using silver alginate here. They came up today that he sleeps in [name] recliner, I'[name] not sure why but I asked him to stop this 01/03/18; his original wound we've been using Hydrofera Blue is now separated into 2 areas. ooUlcer on his left buttock is better he is off the recliner and sleeping in bed ooFinally both wound areas between his first and second toes also looks some better 01/10/18; his original wound on the left lateral leg is now separated into 2 wounds we've been using Hydrofera Blue ooUlcer on his left buttock has some drainage. There is [name] small probing site going into muscle layer superiorly.using silver alginate -He arrives today with [name] deep tissue injury on the left heel ooThe wound on the dorsal aspect of his first second toe on the left looks [name] lot betterusing silver alginate ketoconazole ooThe area on the first second toe web space on the right also looks [name] lot bette 01/17/18; his original wound on the left lateral leg continues to progress using Hydrofera Blue ooUlcer on his left buttock also is smaller surface healthier except for [name] small probing site going into the muscle layer superiorly. 2.4 cm  of tunneling in this area ooDTI on his left heel we have only been offloading. Looks better than last week no threatened open no evidence of infection oothe wound on the dorsal aspect of the first second toe on the left continues to look like it's regressing we have only been using silver alginate and terbinafine orally ooThe area in the first second toe web space on the right also looks to be [name] lot better using silver alginate and terbinafine I think this  was prompted by tinea pedis 01/31/18; the patient was hospitalized in [place] last week apparently for [name] complicated UTI. He was discharged on cefepime he does in and out catheterizations. In the hospital he was discovered [name] I don't mild elevation of AST and ALT and the terbinafine was stopped.predictably the pressure ulcer on s his buttock looks betterusing silver alginate. The area on the left lateral leg also is better using Hydrofera Blue. The area between the first and second toes on the left better. First and second toes on the right still substantial but better. Finally the DTI on the left heel has held together and looks like it's resolving 02/07/18-he is here in follow-up evaluation for multiple ulcerations. He has new injury to the lateral aspect of the last issue [name] pressure ulcer, he states this is from adhesive removal trauma. He states he has tried multiple adhesive products with no success. All other ulcers appear stable. The left heel DTI is resolving. We will continue with same treatment plan and follow-up next week. 02/14/18; follow-up for multiple areas. ooHe has [name] new area last week on the lateral aspect of his pressure ulcer more over the posterior trochanter. The original pressure ulcer looks quite stable has healthy granulation. 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 Almost fully epithelialized on the original superior area using Hydrofera Blue ooDTI on the left heel has peeled off this week to reveal [name] small superficial wound under denuded skin and subcutaneous tissue ooBoth areas between the first and second toes look better including nothing open on the left 02/21/18; ooThe patient's wounds on his left ischial tuberosity and posterior left greater trochanter actually looked better. He has [name] large area of irritation around the area which I think is contact  dermatitis. I am doubtful that this is fungal ooHis original wound on the left lateral calf continues to improve we have been using Hydrofera Blue ooThere is no open area in the left first second toe web space although there is [name] lot of thick callus ooThe DTI on the left heel required debridement today of necrotic surface eschar and subcutaneous tissue using silver alginate ooFinally the area on the right first second toe webspace continues to contract using silver alginate and ketoconazole 02/28/18 ooLeft ischial tuberosity wounds look better using silver alginate. ooOriginal wound on the left calf only has one small open area left using Hydrofera Blue ooDTI on the left heel required debridement mostly removing skin from around this wound surface. Using silver alginate ooThe areas on the right first/second toe web space using silver alginate and ketoconazole 03/08/18 on evaluation today patient appears to be doing decently well as best I can tell in regard to his wounds. This is the first time that I have seen him as he generally is followed by Dr. Dellia Nims. With that being said none of his wounds appear to be infected he does have an area where  there is some skin covering what appears to be [name] new wound on the left dorsal surface of his great toe. This is right at the nail bed. With that being said I do believe that debrided away some of the excess skin can be of benefit in this regard. Otherwise he has been tolerating the dressing changes without complication. 03/14/18; patient arrives today with the multiplicity of wounds that we are following. He has not been systemically unwell ooOriginal wound on the left lateral calf now only has 2 small open areas we've been using Hydrofera Blue which should continue ooThe deep tissue injury on the left heel requires debridement today. We've been using silver alginate ooThe left first second toe and the right first second toe are both are reminiscence  what I think was tinea pedis. Apparently some of the callus Surface between the toes was removed last week when it started draining. ooPurulent drainage coming from the wound on the ischial tuberosity on the left. 03/21/18-He is here in follow-up evaluation for multiple wounds. 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 He tolerated debridement. The only change to last week's recommendations is to discontinue antifungal cream between toes. He will follow-up next week 03/28/18; following up for multiple wounds;Concern this week is streaking redness and swelling in the right foot. He is going to need antibiotics for this. 03/31/18; follow-up for right foot cellulitis. Streaking redness and swelling in the right foot on 03/28/18. 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 I put him on clindamycin 300 mg every 6 and brought him in for [name] quick check. He has an open wound between his first and second toes on the right foot as [name] potential source. 04/04/18; ooRight foot cellulitis is resolving he is completing clindamycin. This is truly good news ooLeft lateral calf wound which is initial wound only has one small open area inferiorly this is close to healing out. He has compression stockings. We will use Hydrofera Blue right down to the epithelialization of this ooNonviable surface on the left heel which was initially pressure with [name] DTI. We've been using Hydrofera Blue. I'[name] going to switch this back to silver alginate ooLeft first second toe/tinea pedis this looks better using silver alginate ooRight first second toe tinea pedis using silver alginate ooLarge pressure ulcers on theLeft ischial tuberosity. Small wound here Looks better. I am uncertain about the surface over the large wound. Using silver alginate 04/11/18; ooCellulitis in the right foot is resolved ooLeft lateral calf wound which was his  original wounds still has 2 tiny open areas remaining this is just about closed ooNonviable surface on the left heel is better but still requires debridement ooLeft first second toe/tinea pedis still open using silver alginate ooRight first second toe wound tinea pedis I asked him to go back to using ketoconazole and silver alginate ooLarge pressure ulcers on the left ischial tuberosity this shear injury here is resolved. Wound is smaller. No evidence of infection using silver alginate 04/18/18; ooPatient arrives with an intense area of cellulitis in the right mid lower calf extending into the right heel area. Bright red and warm. Smaller area on the left anterior leg. He has [name] significant history of MRSA. He will definitely need antibioticsoodoxycycline ooHe now has 2 open areas on the left ischial tuberosity the original large wound and now [name] satellite area which I think was above his initial satellite areas. Not [name] wonderful surface on  this satellite area surrounding erythema which looks like pressure related. ooHis left lateral calf wound again his original wound is just about closed ooLeft heel pressure injury still requiring debridement ooLeft first second toe looks [name] lot better using silver alginate ooRight first second toe also using silver alginate and ketoconazole cream also looks better 04/20/18; the patient was worked in early today out of concerns with his cellulitis on the right leg. I had started him on doxycycline. This was 2 days ago. His wife was concerned about the swelling in the area. Also concerned about the left buttock. He has not been systemically unwell no fever chills. No nausea vomiting or diarrhea 04/25/18; the patient's left buttock wound is continued to deteriorate he is using Hydrofera Blue. He is still completing clindamycin for the cellulitis on the right leg although all of this looks better. 05/02/18 ooLeft buttock wound still with [name] lot of drainage and [name]  very tightly adherent fibrinous necrotic surface. He has [name] deeper area superiorly ooThe left lateral calf wound is still closed ooDTI wound on the left heel necrotic surface especially the circumference using Iodoflex ooAreas between his left first second toe and right first second toe both look better. Dorsally and the right first second toe he had [name] necrotic surface although at smaller. In using silver alginate and ketoconazole. I did [name] culture last week which was [name] deep tissue culture of the reminiscence of the open wound on the right first second toe dorsally. This grew [name] few Acinetobacter and [name] few methicillin-resistant staph aureus. Nevertheless the area actually this week looked better. I didn't feel the need to specifically address this at least in terms of systemic antibiotics. 05/09/18; wounds are measuring larger more drainage per our intake. We are using Santyl covered with alginate on the large superficial buttock wounds, Iodosorb on the left heel, ketoconazole and silver alginate to the dorsal first and second toes bilaterally. 05/16/18; ooThe area on his left buttock better in some aspects although the area superiorly over the ischial tuberosity required an extensive debridement.using Santyl ooLeft heel appears stable. Using Iodoflex ooThe areas between his first and second toes are not bad however there is spreading erythema up the dorsal aspect of his left foot this looks like cellulitis again. He is insensate the erythema is really very brilliant.o Erysipelas He went to see an allergist days ago because he was itching part of this he had lab work done. This showed [name] white count of 15.1 with 70% neutrophils. Hemoglobin of 11.4 and [name] platelet count of 659,000. Last white count we had in Epic was [name] 2-1/2 years ago which was 25.9 but he was ill at the time. He was able to show me some lab work that was done by his primary physician the pattern is about the same. 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 But prompted me to go ahead and do x-rays of both feet and the pelvis rule out osteomyelitis. He also had [name] comprehensive metabolic panel this was reasonably normal his albumin was 3.7 liver function tests BUN/creatinine all normal 05/23/18; x-rays of both his feet from last week were negative for underlying pulmonary abnormality. The x-ray of his pelvis however showed mild irregularity in the left ischial which may represent some early osteomyelitis. The wound in the left ischial continues to get deeper clearly now exposed muscle. Each week necrotic surface material over this area. Whereas the rest of the wounds  do not look so bad. ooThe left ischial wound we have been using Santyl and calcium alginate ooT the left heel surface necrotic debris using Iodoflex o ooThe left lateral leg is still healed ooAreas on the left dorsal foot and the right dorsal foot are about the same. There is some inflammation on the left which might represent contact dermatitis, fungal dermatitis I am doubtful cellulitis although this looks better than last week 05/30/18; CT scan done at Hospital did not show any osteomyelitis or abscess. Suggested the possibility of underlying cellulitis although I don't see [name] lot of evidence of this at the bedside ooThe wound itself on the left buttock/upper thigh actually looks somewhat better. No debridement ooLeft heel also looks better no debridement continue Iodoflex ooBoth dorsal first second toe spaces appear better using Lotrisone. Left still required debridement 06/06/18; ooIntake reported some purulent looking drainage from the left gluteal wound. Using Santyl and calcium alginate ooLeft heel looks better although still [name] nonviable surface requiring debridement ooThe left dorsal foot first/second webspace actually expanding and somewhat deeper. I may consider doing [name] shave biopsy of this  area ooRight dorsal foot first/second webspace appears stable to improved. Using Lotrisone and silver alginate to both these areas 06/13/18 ooLeft gluteal surface looks better. Now separated in the 2 wounds. No debridement required. Still drainage. We'll continue silver alginate ooLeft heel continues to look better with Iodoflex continue this for at least another week ooOf his dorsal foot wounds the area on the left still has some depth although it looks better than last week. We've been using Lotrisone and silver alginate 06/20/18 ooLeft gluteal continues to look better healthy tissue ooLeft heel continues to look better healthy granulation wound is smaller. He is using Iodoflex and his long as this continues continue the Iodoflex ooDorsal right foot looks better unfortunately dorsal left foot does not. There is swelling and erythema of his forefoot. He had minor trauma to this several days ago but doesn't think this was enough to have caused any tissue injury. Foot looks like cellulitis, we have had this problem before 06/27/18 on evaluation today patient appears to be doing [name] little worse in regard to his foot ulcer. 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 Both of which would really be his only options for treating this infection. In the past he has been given and effusion of Orbactiv. This is done very well for him in the past again it's one time dosing IV antibiotic therapy. Subsequently I do believe this is something we're gonna need to see about doing at this point in time. Currently his other wounds seem to be doing somewhat better in my pinion I'[name] pretty happy in that regard. 07/03/18 on evaluation today patient's wounds actually appear to be doing fairly well. He has been tolerating the dressing changes without complication. All in all he seems  to be showing signs of improvement. In regard to the antibiotics he has been dealing with infectious disease since I saw him last week as far as getting this scheduled. In the end he's going [facility] this coming Friday. In the meantime he has been continuing to perform the dressing changes in such as previous. There does not appear to be any evidence of infection worsengin at this time. 07/10/18; ooSince I last  saw this man 2 weeks ago things have actually improved. IV antibiotics of resulted in less forefoot erythema although there is still some present. He is not systemically unwell ooLeft buttock wounds o2 now have no depth there is increased epithelialization Using silver alginate ooLeft heel still requires debridement using Iodoflex ooLeft dorsal foot still with [name] sizable wound about the size of [name] border but healthy granulation ooRight dorsal foot still with [name] slitlike area using silver alginate 07/18/18; the patient's cellulitis in the left foot is improved in fact I think it is on its way to resolving. ooLeft buttock wounds o2 both look better although the larger one has hypertension granulation we've been using silver alginate ooLeft heel has some thick circumferential redundant skin over the wound edge which will need to be removed today we've been using Iodoflex ooLeft dorsal foot is still [name] sizable wound required debridement using silver alginate ooThe right dorsal foot is just about closed only [name] small open area remains here 07/25/18; left foot cellulitis is resolved ooLeft buttock wounds o2 both look better. Hyper-granulation on the major area ooLeft heel as some debris over the surface but otherwise looks [name] healthier wound. Using silver collagen ooRight dorsal foot is just about closed 07/31/18; arrives with our intake nurse worried about purulent drainage from the buttock. We had hyper-granulation here last week ooHis  buttock wounds o2 continue to look better ooLeft heel some debris over the surface but measuring smaller. ooRight dorsal foot unfortunately has openings between the toes ooLeft foot superficial wound looks less aggravated. 08/07/18 ooButtock wounds continue to look better although some of her granulation and the larger medial wound. silver alginate ooLeft heel continues to look [name] lot better.silver collagen ooLeft foot superficial wound looks less stable. Requires debridement. He has [name] new wound superficial area on the foot on the lateral dorsal foot. ooRight foot looks better using silver alginate without Lotrisone 08/14/2018; patient was in the ER last week diagnosed with [name] UTI. He is now on Cefpodoxime and Macrodantin. ooButtock wounds continued to be smaller. Using silver alginate ooLeft heel continues to look better using silver collagen ooLeft foot superficial wound looks as though it is improving ooRight dorsal foot area is just about healed. 08/21/2018; patient is completed his antibiotics for his UTI. ooHe has 2 open areas on the buttocks. There is still not closed although the surface looks satisfactory. Using silver alginate ooLeft heel continues to improve using silver collagen ooThe bilateral dorsal foot areas which are at the base of his first and second toes/possible tinea pedis are actually stable on the left but worse on the right. The area on the left required debridement of necrotic surface. After debridement I obtained [name] specimen for PCR culture. ooThe right dorsal foot which is been just about healed last week is now reopened 08/28/2018; culture done on the left dorsal foot showed coag negative staph both staph epidermidis and Lugdunensis. I think this is worthwhile initiating systemic treatment. I will use doxycycline given his long list of allergies. The area on the left heel slightly improved but still requiring debridement. ooThe large wound on the buttock  is just about closed whereas the smaller one is larger. Using silver alginate in this area 09/04/2018; patient is completing his doxycycline for the left foot although this continues to be [name] very difficult wound area with very adherent necrotic debris. We are using silver alginate to all his wounds right foot left foot and the small wounds on his buttock, silver collagen  on the left heel. 09/11/2018; once again this patient has intense erythema and swelling of the left forefoot. Lesser degrees of erythema in the right foot. He has [name] long list of allergies and intolerances. I will reinstitute doxycycline. 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 Using silver alginate ooLeft heel also looks better using silver collagen ooUnfortunately both the areas on his feet look worse. The area on the left first second webspace is now gone through to the plantar part of his foot. The area on the left foot anteriorly is irritated with erythema and swelling in the forefoot. 09/25/2018 ooHis wound on the left plantar heel looks better. Using silver collagen ooThe area on the left buttock 2 small remnant areas. One is closed one is still open. Using silver alginate ooThe areas between both his first and second toes look worse. This in spite of long-standing antifungal therapy with ketoconazole and silver alginate which should have antifungal activity ooHe has small areas around his original wound on the left calf one is on the bottom of the original scar tissue and one superiorly both of these are small and superficial but again given wound history in this site this is worrisome 10/02/2018 ooLeft plantar heel continues to gradually contract using silver collagen ooLeft buttock wound is unchanged using silver alginate ooThe areas on his dorsal feet between his first and second toes bilaterally look about the same. I prescribed clindamycin ointment to see if  we can address chronic staph colonization and also the underlying possibility of erythrasma ooThe left lateral lower extremity wound is actually on the lateral part of his ankle. Small open area here. We have been using silver alginate 10/09/2018; ooLeft plantar heel continues to look healthy and contract. No debridement is required ooLeft buttock slightly smaller with [name] tape injury wound just below which was new this week ooDorsal feet somewhat improved I have been using clindamycin ooLeft lateral looks lower extremity the actual open area looks worse although [name] lot of this is epithelialized. I am going to change to silver collagen today He has [name] lot more swelling in the right leg although this is not pitting not red and not particularly warm there is [name] lot of spasm in the right leg usually indicative of people with paralysis of some underlying discomfort. We have reviewed his vascular status from 2017 he had [name] left greater saphenous vein ablation. I wonder about referring him back to vascular surgery if the area on the left leg continues to deteriorate. 10/16/2018 in today for follow-up and management of multiple lower extremity ulcers. His left Buttock wound is much lower smaller and almost closed completely. The wound to the left ankle has began to reopen with Epithelialization and some adherent slough. He has multiple new areas to the left foot and leg. The left dorsal foot without much improvement. Wound present between left great webspace and 2nd toe. Erythema and edema present right leg. Right LE ultrasound obtained on 10/10/18 was negative for DVT . 10/23/2018; ooLeft buttock is closed over. Still dry macerated skin but there is no open wound. I suspect this is chronic pressure/moisture ooLeft lateral calf is quite [name] bit worse than when I saw this last. There is clearly drainage here he has macerated skin into the left plantar heel. We will change the primary dressing to  alginate ooLeft dorsal foot has some improvement in overall wound area. Still using clindamycin and silver alginate ooRight  dorsal foot about the same as the left using clindamycin and silver alginate ooThe erythema in the right leg has resolved. He is DVT rule out was negative ooLeft heel pressure area required debridement although the wound is smaller and the surface is health 10/26/2018 ooThe patient came back in for his nurse check today predominantly because of the drainage coming out of the left lateral leg with [name] recent reopening of his original wound on the left lateral calf. He comes in today with [name] large amount of surrounding erythema around the wound extending from the calf into the ankle and even in the area on the dorsal foot. He is not systemically unwell. He is not febrile. Nevertheless this looks like cellulitis. We have been using silver alginate to the area. I changed him to [name] regular visit and I am going to prescribe him doxycycline. The rationale here is [name] long list of medication intolerances and [name] history of MRSA. I did not see anything that I thought would provide [name] valuable culture 10/30/2018 ooFollow-up from his appointment 4 days ago with really an extensive area of cellulitis in the left calf left lateral ankle and left dorsal foot. I put him on doxycycline. He has [name] long list of medication allergies which are true allergy reactions. 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 In any case he is [name] lot better today. The erythema especially in the anterior and lateral left calf is better. He still has left ankle erythema. He also is complaining about increasing edema in the right leg we have only been using Kerlix Coban and he has been doing the wraps at home. Finally he has [name] spotty rash on the medial part of his upper left calf which looks like folliculitis or perhaps wrap occlusion type injury. Small  superficial macules not pustules 11/06/18 patient arrives today with again [name] considerable degree of erythema around the wound on the left lateral calf extending into the dorsal ankle and dorsal foot. This is [name] lot worse than when I saw this last week. He is on doxycycline really with not [name] lot of improvement. He has not been systemically unwell Wounds on the; left heel actually looks improved. Original area on the left foot and proximity to the first and second toes looks about the same. He has superficial areas on the dorsal foot, anterior calf and then the reopening of his original wound on the left lateral calf which looks about the same ooThe only area he has on the right is the dorsal webspace first and second which is smaller. ooHe has [name] large area of dry erythematous skin on the left buttock small open area here. 11/13/2018; the patient arrives in much better condition. The erythema around the wound on the left lateral calf is [name] lot better. Not sure whether this was the clindamycin or the TCA and ketoconazole or just in the improvement in edema control [stasis dermatitis]. In any case this is [name] lot better. The area on the left heel is very small and just about resolved using silver collagen we have been using silver alginate to the areas on his dorsal feet 11/20/2018; his wounds include the left lateral calf, left heel, dorsal aspects of both feet just proximal to the first second webspace. He is stable to slightly improved. I did not think any changes to his dressings were going to be necessary 11/27/2018 he has [name] reopening on the left buttock  which is surrounded by what looks like tinea or perhaps some other form of dermatitis. The area on the left dorsal foot has some erythema around it I have marked this area but I am not sure whether this is cellulitis or not. Left heel is not closed. Left calf the reopening is really slightly longer and probably worse 1/13; in general things look better  and smaller except for the left dorsal foot. Area on the left heel is just about closed, left buttock looks better only [name] small wound remains in the skin looks better [using Lotrisone] 1/20; the area on the left heel only has [name] few remaining open areas here. Left lateral calf about the same in terms of size, left dorsal foot slightly larger right lateral foot still not closed. The area on the left buttock has no open wound and the surrounding skin looks [name] lot better 1/27; the area on the left heel is closed. Left lateral calf better but still requiring extensive debridements. The area on his left buttock is closed. He still has the open areas on the left dorsal foot which is slightly smaller in the right foot which is slightly expanded. We have been using Iodoflex on these areas as well 2/3; left heel is closed. Left lateral calf still requiring debridement using Iodoflex there is no open area on his left buttock however he has dry scaly skin over [name] large area of this. Not really responding well to the Lotrisone. Finally the areas on his dorsal feet at the level of the first second webspace are slightly smaller on the right and about the same on the left. Both of these vigorously debrided with Anasept and gauze 2/10; left heel remains closed he has dry erythematous skin over the left buttock but there is no open wound here. Left lateral leg has come in and with. Still requiring debridement we have been using Iodoflex here. Finally the area on the left dorsal foot and right dorsal foot are really about the same extremely dry callused fissured areas. He does not yet have [name] dermatology appointment 2/17; left heel remains closed. He has [name] new open area on the left buttock. The area on the left lateral calf is bigger longer and still covered in necrotic debris. No major change in his foot areas bilaterally. I am awaiting for [name] dermatologist to look on this. We have been using ketoconazole I do not know that  this is been doing any good at all. 2/24; left heel remains closed. The left buttock wound that was new reopening last week looks better. The left lateral calf appears better also although still requires debridement. The major area on his foot is the left first second also requiring debridement. We have been putting Prisma on all wounds. I do not believe that the ketoconazole has done too much good for his feet. He will use Lotrisone I am going to give him [name] 2-week course of terbinafine. We still do not have [name] dermatology appointment 3/2 left heel remains closed however there is skin over bone in this area I pointed this out to him today. The left buttock wound is epithelialized but still does not look completely stable. The area on the left leg required debridement were using silver collagen here. With regards to his feet we changed to Lotrisone last week and silver alginate. 3/9; left heel remains closed. Left buttock remains closed. The area on the right foot is essentially closed. The left foot remains unchanged. Slightly smaller  on the left lateral calf. Using silver collagen to both of these areas 3/16-Left heel remains closed. Area on right foot is closed. Left lateral calf above the lateral malleolus open wound requiring debridement with easy bleeding. Left dorsal wound proximal to first toe also debrided. Left ischial area open new. Patient has been using Prisma with wrapping every 3 days. Dermatology appointment is apparently tomorrow.Patient has completed his terbinafine 2-week course with some apparent improvement according to him, there is still flaking and dry skin in his foot on the left 3/23; area on the right foot is reopened. The area on the left anterior foot is about the same still [name] very necrotic adherent surface. He still has the area on the left leg and reopening is on the left buttock. He apparently saw dermatology although I do not have [name] note. According to the patient who  is usually fairly well informed they did not have any good ideas. Put him on oral terbinafine which she is been on before. 3/30; using silver collagen to all wounds. Apparently his dermatologist put him on doxycycline and rifampin presumably some culture grew staph. I do not have this result. He remains on terbinafine although I have used terbinafine on him before 4/6; patient has had [name] fairly substantial reopening on the right foot between the first and second toes. He is finished his terbinafine and I believe is on doxycycline and rifampin still as prescribed by dermatology. We have been using silver collagen to all his wounds although the patient reports that he thinks silver alginate does better on the wounds on his buttock. 4/13; the area on his left lateral calf about the same size but it did not require debridement. ooLeft dorsal foot just proximal to the webspace between the first and second toes is about the same. Still nonviable surface. I note some superficial bronze discoloration of the dorsal part of his foot ooRight dorsal foot just proximal to the first and second toes also looks about the same. I still think there may be the same discoloration I noted above on the left ooLeft buttock wound looks about the same 4/20; left lateral calf appears to be gradually contracting using silver collagen. 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 The left dorsal foot wound is debrided of tightly adherent necrotic debris and really cleans up quite nicely. The right area is worse with expansion. I did not debride this it is now over the base of the second toe ooThe area on his left buttock is smaller no debridement is required using silver collagen 5/4; left calf continues to make good progress. ooHe arrives with erythema around the wounds on his dorsal foot which even extends to the plantar aspect. Very concerning for coexistent  infection. He is finished the erythromycin I gave him for possible erythrasma this does not seem to have helped. ooThe area on the left foot is about the same base of the dorsal toes ooIs area on the buttock looks improved on the left 5/11; left calf and left buttock continued to make good progress. Left foot is about the same to slightly improved. ooMajor problem is on the right foot. He has not had an x-ray. Deep tissue culture I did last week showed both Enterobacter and E. coli. I did not change the doxycycline I put him on empirically although neither 1 of these were plated to doxycycline. He arrives today with the erythema looking worse on both the dorsal and plantar  foot. Macerated skin on the bottom of the foot. he has not been systemically unwell 5/18-Patient returns at 1 week, left calf wound appears to be making some progress, left buttock wound appears slightly worse than last time, left foot wound looks slightly better, right foot redness is marginally better. X-ray of both feet show no air or evidence of osteomyelitis. Patient is finished his Omnicef and terbinafine. He continues to have macerated skin on the bottom of the left foot as well as right 5/26; left calf wound is better, left buttock wound appears to have multiple small superficial open areas with surrounding macerated skin. X-rays that I did last time showed no evidence of osteomyelitis in either foot. He is finished cefdinir and doxycycline. I do not think that he was on terbinafine. He continues to have [name] large superficial open area on the right foot anterior dorsal and slightly between the first and second toes. I did send him to dermatology 2 months ago or so wondering about whether they would do [name] fungal scraping. I do not believe they did but did do [name] culture. We have been using silver alginate to the toe areas, he has been using antifungals at home topically either ketoconazole or Lotrisone. We are using silver  collagen on the left foot, silver alginate on the right, silver collagen on the left lateral leg and silver alginate on the left buttock 6/1; left buttock area is healed. We have the left dorsal foot, left lateral leg and right dorsal foot. We are using silver alginate to the areas on both feet and silver collagen to the area on his left lateral calf 6/8; the left buttock apparently reopened late last week. He is not really sure how this happened. He is tolerating the terbinafine. Using silver alginate to all wounds 6/15; left buttock wound is larger than last week but still superficial. ooCame in the clinic today with [name] report of purulence from the left lateral leg I did not identify any infection ooBoth areas on his dorsal feet appear to be better. He is tolerating the terbinafine. Using silver alginate to all wounds 6/22; left buttock is about the same this week, left calf quite [name] bit better. His left foot is about the same however he comes in with erythema and warmth in the right forefoot once again. Culture that I gave him in the beginning of May showed Enterobacter and E. coli. I gave him doxycycline and things seem to improve although neither 1 of these organisms was specifically plated. 6/29; left buttock is larger and dry this week. Left lateral calf looks to me to be improved. Left dorsal foot also somewhat improved right foot completely unchanged. The erythema on the right foot is still present. He is completing the Ceftin dinner that I gave him empirically [see discussion above.) 7/6 - All wounds look to be stable and perhaps improved, the left buttock wound is slightly smaller, per patient bleeds easily, completed ceftin, the right foot redness is less, he is on terbinafine 7/13; left buttock wound about the same perhaps slightly narrower. Area on the left lateral leg continues to narrow. Left dorsal foot slightly smaller right foot about the same. We are using silver alginate on the  right foot and Hydrofera Blue to the areas on the left. Unna boot on the left 2 layer compression on the right 7/20; left buttock wound absolutely the same. Area on lateral leg continues to get better. Left dorsal foot require debridement as did the right  no major change in the 7/27; left buttock wound the same size necrotic debris over the surface. The area on the lateral leg is closed once again. His left foot looks better right foot about the same although there is some involvement now of the posterior first second toe area. He is still on terbinafine which I have given him for [name] month, not certain [name] centimeter major change 06/25/19-All wounds appear to be slightly improved according to report, left buttock wound looks clean, both foot wounds have minimal to no debris the right dorsal foot has minimal slough. We are using Hydrofera Blue to the left and silver alginate to the right foot and ischial wound. 8/10-Wounds all appear to be around the same, the right forefoot distal part has some redness which was not there before, however the wound looks clean and small. Ischial wound looks about the same with no changes 8/17; his wound on the left lateral calf which was his original chronic venous insufficiency wound remains closed. Since I last saw him the areas on the left dorsal foot right dorsal foot generally appear better but require debridement. The area on his left initial tuberosity appears somewhat larger to me perhaps hyper granulated and bleeds very easily. We have been using Hydrofera Blue to the left dorsal foot and silver alginate to everything else 8/24; left lateral calf remains closed. The areas on his dorsal feet on the webspace of the first and second toes bilaterally both look better. The area on the left buttock which is the pressure ulcer stage II slightly smaller. I change the dressing to Hydrofera Blue to all areas 8/31; left lateral calf remains closed. The area on his dorsal  feet bilaterally look better. Using Hydrofera Blue. Still requiring debridement on the left foot. No change in the left buttock pressure ulcers however 9/14; left lateral calf remains closed. Dorsal feet look quite [name] bit better than 2 weeks ago. Flaking dry skin also [name] lot better with the ammonium lactate I gave him 2 weeks ago. The area on the left buttock is improved. He states that his Roho cushion developed [name] leak and he is getting [name] new one, in the interim he is offloading this vigorously 9/21; left calf remains closed. Left heel which was [name] possible DTI looks better this week. He had macerated tissue around the left dorsal foot right foot looks satisfactory and improved left buttock wound. I changed his dressings to his feet to silver alginate bilaterally. Continuing Hydrofera Blue on the left buttock. 9/28 left calf remains closed. Left heel did not develop anything [possible DTI] dry flaking skin on the left dorsal foot. Right foot looks satisfactory. Improved left buttock wound. We are using silver alginate on his feet Hydrofera Blue on the buttock. I have asked him to go back to the Lotrisone on his feet including the wounds and surrounding areas 10/5; left calf remains closed. The areas on the left and right feet about the same. [name] lot of this is epithelialized however debris over the remaining open areas. He is using Lotrisone and silver alginate. The area on the left buttock using Hydrofera Blue 10/26. Patient has been out for 3 weeks secondary to Covid concerns. He tested negative but I think his wife tested positive. He comes in today with the left foot substantially worse, right foot about the same. Even more concerning he states that the area on his left buttock closed over but then reopened and is considerably deeper in one aspect than  it was before [stage III wound] 11/2; left foot really about the same as last week. Quarter sized wound on the dorsal foot just proximal to the first  second toes. Surrounding erythema with areas of denuded epithelium. This is not really much different looking. Did not look like cellulitis this time however. ooRight foot area about the same.. We have been using silver alginate alginate on his toes ooLeft buttock still substantial irritated skin around the wound which I think looks somewhat better. We have been using Hydrofera Blue here. 11/9; left foot larger than last week and [name] very necrotic surface. Right foot I think is about the same perhaps slightly smaller. Debris around the circumference also addressed. Unfortunately on the left buttock there is been [name] decline. Satellite lesions below the major wound distally and now [name] an additional one posteriorly we have been using Hydrofera Blue but I think this is [name] pressure issue 11/16; left foot ulcer dorsally again [name] very adherent necrotic surface. Right foot is about the same. Not much change in the pressure ulcer on his left buttock. 11/30; left foot ulcer dorsally basically the same as when I saw him 2 weeks ago. Very adherent fibrinous debris on the wound surface. Patient reports [name] lot of drainage as well. 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 We have been using Iodoflex, patient changed back to alginate because of the drainage. Area on his right dorsal foot really looks benign with [name] healthier surface certainly [name] lot better than on the left. Left buttock wounds all improved using Hydrofera Blue 12/7; left dorsal foot again no improvement. Tightly adherent debris. PCR culture I did last week only showed likely skin contaminant. I have gone ahead and done [name] punch biopsy of this which is about the last thing in terms of investigations I can think to do. He has known venous insufficiency and venous hypertension and this could be the issue here. The area on the right foot is about the same left buttock slightly worse according to our intake nurse  secondary to [facility] Blue sticking to the wound 12/14; biopsy of the left foot that I did last time showed changes that could be related to wound healing/chronic stasis dermatitis phenomenon no neoplasm. We have been using silver alginate to both feet. I change the one on the left today to Sorbact and silver alginate to his other 2 wounds 12/28; the patient arrives with the following problems; ooMajor issue is the dorsal left foot which continues to be [name] larger deeper wound area. Still with [name] completely nonviable surface ooParadoxically the area mirror image on the right on the right dorsal foot appears to be getting better. ooHe had some loss of dry denuded skin from the lower part of his original wound on the left lateral calf. Some of this area looked [name] little vulnerable and for this reason we put him in wrap that on this side this week ooThe area on his left buttock is larger. He still has the erythematous circular area which I think is [name] combination of pressure, sweat. This does not look like cellulitis or fungal dermatitis 11/26/2019; -Dorsal left foot large open wound with depth. Still debris over the surface. Using Sorbact ooThe area on the dorsal right foot paradoxically has closed over [facility] has [name] reopening on the left ankle laterally at the base of his original wound that extended up into the calf. This appears clean. ooThe left buttock wound is smaller  but with very adherent necrotic debris over the surface. We have been using silver alginate here as well The patient had arterial studies done in 2017. He had biphasic waveforms at the dorsalis pedis and posterior tibial bilaterally. ABI in the left was 1.17. Digit waveforms were dampened. He has slight spasticity in the great toes I do not think [name] TBI would be possible 1/11; the patient comes in today with [name] sizable reopening between the first and second toes on the right. This is not exactly in the same location where we  have been treating wounds previously. According to our intake nurse this was actually fairly deep but 0.6 cm. The area on the left dorsal foot looks about the same the surface is somewhat cleaner using Sorbact, his MRI is in 2 days. We have not managed yet to get arterial studies. The new reopening on the left lateral calf looks somewhat better using alginate. The left buttock wound is about the same using alginate 1/18; the patient had his ARTERIAL studies which were quite normal. ABI in the right at 1.13 with triphasic/biphasic waveforms on the left ABI 1.06 again with triphasic/biphasic waveforms. It would not have been possible to have done [name] toe brachial index because of spasticity. We have been using Sorbac to the left foot alginate to the rest of his wounds on the right foot left lateral calf and left buttock 1/25; arrives in clinic with erythema and swelling of the left forefoot worse over the first MTP area. This extends laterally dorsally and but also posteriorly. Still has an area on the left lateral part of the lower part of his calf wound it is eschared and clearly not closed. ooArea on the left buttock still with surrounding irritation and erythema. ooRight foot surface wound dorsally. The area between the right and first and second toes appears better. 2/1; ooThe left foot wound is about the same. Erythema slightly better I gave him [name] week of doxycycline empirically ooRight foot wound is more extensive extending between the toes to the plantar surface ooLeft lateral calf really no open surface on the inferior part of his original wound however the entire area still looks vulnerable ooAbsolutely no improvement in the left buttock wound required debridement. 2/8; the left foot is about the same. Erythema is slightly improved I gave him clindamycin last week. ooRight foot looks better he is using Lotrimin and silver alginate ooHe has [name] breakdown in the left lateral calf. Denuded  epithelium which I have removed ooLeft buttock about the same were using Hydrofera Blue 2/15; left foot is about the same there is less surrounding erythema. Surface still has tightly adherent debris which I have debriding however not making any progress ooRight foot has [name] substantial wound on the medial right second toe between the first and second webspace. ooStill an open area on the left lateral calf distal area. ooButtock wound is about the same 2/22; left foot is about the same less surrounding erythema. Surface has adherent debris. Polymen Ag Right foot area significant wound between the first and second toes. We have been using silver alginate here Left lateral leg polymen Ag at the base of his original venous insufficiency wound ooLeft buttock some improvement here 3/1; ooRight foot is deteriorating in the first second toe webspace. Larger and more substantial. We have been using silver alginate. ooLeft dorsal foot about the same markedly adherent surface debris using PolyMem Ag ooLeft lateral calf surface debris using PolyMem AG ooLeft buttock is improved again  using PolyMem Ag. ooHe is completing his terbinafine. The erythema in the foot seems better. He has been on this for 2 weeks 3/8; no improvement in any wound area in fact he has [name] small open area on the dorsal midfoot which is new this week. He has not gotten his foot x-rays yet 3/15; his x-rays were both negative for osteomyelitis of both feet. No major change in any of his wounds on the extremities however his buttock wounds are better. We have been using polymen on the buttocks, left lower leg. Iodoflex on the left foot and silver alginate on the right 3/22; arrives in clinic today with the 2 major issues are the improvement in the left dorsal foot wound which for once actually looks healthy with [name] nice healthy wound surface without debridement. Using Iodoflex here. Unfortunately on the left lateral calf which is in  the distal part of his original wound he came to the clinic here for there was purulent drainage noted some increased breakdown scattered around the original area and [name] small area proximally. We we are using polymen here will change to silver alginate today. His buttock wound on the left is better and I think the area on the right first second toe webspace is also improved 3/29; left dorsal foot looks better. Using Iodoflex. Left ankle culture from deterioration last time grew E. coli, Enterobacter and Enterococcus. I will give him [name] course of cefdinir although that will not cover Enterococcus. The area on the right foot in the webspace of the first and second toe lateral first toe looks better. The area on his buttock is about healed Vascular appointment is on April 21. This is to look at his venous system vis--vis continued breakdown of the wounds on the left including the left lateral leg and left dorsal foot he. He has had previous ablations on this side 4/5; the area between the right first and second toes lateral aspect of the first toe looks better. Dorsal aspect of the left first toe on the left foot also improved. Unfortunately the left lateral lower leg is larger and there is [name] second satellite wound superiorly. The usual superficial abrasions on the left buttock overall better but certainly not closed 4/12; the area between the right first and second toes is improved. Dorsal aspect of the left foot also slightly smaller with [name] vibrant healthy looking surface. No real change in the left lateral leg and the left buttock wound is healed He has an unaffordable co-pay for Apligraf. Appointment with vein and vascular with regards to the left leg venous part of the circulation is on 4/21 4/19; we continue to see improvement in all wound areas. Although this is minor. He has his vascular appointment on 4/21. The area on the left buttock has not reopened although right in the center of this area  the skin looks somewhat threatened 4/26; the left buttock is unfortunately reopened. In general his left dorsal foot has [name] healthy surface and looks somewhat smaller although it was not measured as such. The area between his first and second toe webspace on the right as [name] small wound against the first toe. The patient saw vascular surgery. The real question I was asking was about the small saphenous vein on the left. He has previously ablated left greater saphenous vein. Nothing further was commented on on the left. Right greater saphenous vein without reflux at the saphenofemoral junction or proximal thigh there was no indication for ablation of the  right greater saphenous vein duplex was negative for DVT bilaterally. They did not think there was anything from [name] point of view that could be offered. They ABIs within normal limits 5/3; only small open area on the left buttock. The area on the left lateral leg which was his original venous reflux is now 2 wounds both which look clean. We are using Iodoflex on the left dorsal foot which looks healthy and smaller. He is down to [name] very tiny area between the right first and second toes, using silver alginate 5/10; all of his wounds appear better. We have much better edema control in 4 layer compression on the left. This may be the factor that is allowing the left foot and left lateral calf to heal. He has external compression garments at home 04/14/20-All of his wounds are progressing well, the left forefoot is practically closed, left ischium appears to be about the same, right toe webspace is also smaller. The left lateral leg is about the same, continue using Hydrofera Blue to this, silver alginate to the ischium, Iodoflex to the toe space on the right 6/7; most of his wounds outside of the left buttock are doing well. The area on the left lateral calf and left dorsal foot are smaller. The area on the right foot in between the first and  second toe webspace is barely visible although he still says there is some drainage here is the only reason I did not heal this out. ooUnfortunately the area on the left buttock almost looks like he has [name] skin tear from tape. He has open wound and then [name] large flap of skin that we are trying to get adherence over an area just next to the remaining wound 6/21; 2 week follow-up. I believe is been here for nurse visits. Miraculously the area between his first and second toes on the left dorsal foot is closed over. Still open on the right first second web space. The left lateral calf has 2 open areas. Distally this is more superficial. The proximal area had [name] little more depth and required debridement of adherent necrotic material. His buttock wound is actually larger we have been using silver alginate here 6/28; the patient's area on the left foot remains closed. Still open wet area between the first and second toes on the right and also extending into the plantar aspect. We have been using silver alginate in this location. He has 2 areas on the left lower leg part of his original long wounds which I think are better. We have been using Hydrofera Blue here. Hydrofera Blue to the left buttock which is stable 7/12; left foot remains closed. Left ankle is closed. May be [name] small area between his right first and second toes the only truly open area is on the left buttock. We have been using Hydrofera Blue here 7/19; patient arrives with marked deterioration especially in the left foot and ankle. We did not put him in [name] compression wrap on the left last week in fact he wore his juxta lite stockings on either side although he does not have an underlying stocking. He has [name] reopening on the left dorsal foot, left lateral ankle and [name] new area on the right dorsal ankle. More worrisome is the degree of erythema on the left foot extending on the lateral foot into the lateral lower leg on the left 7/26; the patient  had erythema and drainage from the lateral left ankle last week. Culture of  this grew MRSA resistant to doxycycline and clindamycin which are the 2 antibiotics we usually use with this patient who has multiple antibiotic allergies including linezolid, trimethoprim sulfamethoxazole. I had give him an empiric doxycycline and he comes in the area certainly looks somewhat better although it is blotchy in his lower leg. He has not been systemically unwell. He has had areas on the left dorsal foot which is [name] reopening, chronic wounds on the left lateral ankle. Both of these I think are secondary to chronic venous insufficiency. The area between his first and second toes is closed as far as I can tell. He had [name] new wrap injury on the right dorsal ankle last week. Finally he has an area on the left buttock. We have been using silver alginate to everything except the left buttock we are using Hydrofera Blue Objective Constitutional Sitting or standing Blood Pressure is within target range for patient.. Pulse regular and within target range for patient.Marland Kitchen Respirations regular, non-labored and within target range.. Temperature is normal and within the target range for the patient.Marland Kitchen Appears in no distress. Vitals Time Taken: 7:52 AM, Weight: 216 lbs, Temperature: 98.3 F, Pulse: 108 bpm, Respiratory Rate: 16 breaths/min, Blood Pressure: 113/65 mmHg. Respiratory work of breathing is normal. General Notes: Wound exam ooLeft ischial tuberosity looks better. Still the dry irritated skin around the wound. This is not tinea. ooLeft lateral ankle areas generally look better. ooLeft dorsal foot debrided of surface material skin and subcutaneous tissue small open wound. This is [name] reopening of [name] very refractory chronic wound he had. ooI cannot visualize [name] wound between the first and second toes most of this on the lateral part of the first toe. I think it is epithelialized but between his spasticity and the difficulty  of looking in this area I am uncertain. ooThe injury at the top of his right ankle also requires debridement with [name] #3 curette of necrotic surface debris. Hemostasis with direct pressure Integumentary (Hair, Skin) The erythema I marked last time seems somewhat better.. Wound #37R status is Open. Original cause of wound was Gradually Appeared. The wound is located on the Left,Lateral Malleolus. The wound measures 0.1cm length x 0.1cm width x 0.1cm depth; 0.008cm^2 area and 0.001cm^3 volume. There is no tunneling or undermining noted. There is [name] medium amount of serosanguineous drainage noted. The wound margin is flat and intact. There is no granulation within the wound bed. There is [name] large (67-100%) amount of necrotic tissue within the wound bed including Eschar. Wound #38 status is Open. Original cause of wound was Gradually Appeared. The wound is located on the Right T - Web between 1st and 2nd. The wound oe measures 1.5cm length x 0.8cm width x 0.1cm depth; 0.942cm^2 area and 0.094cm^3 volume. There is Fat Layer (Subcutaneous Tissue) Exposed exposed. There is no tunneling or undermining noted. There is [name] small amount of serosanguineous drainage noted. The wound margin is distinct with the outline attached to the wound base. There is large (67-100%) red granulation within the wound bed. There is [name] small (1-33%) amount of necrotic tissue within the wound bed including Adherent Slough. Wound #41 status is Open. Original cause of wound was Gradually Appeared. The wound is located on the Left Ischium. The wound measures 2.5cm length x 1.4cm width x 0.1cm depth; 2.749cm^2 area and 0.275cm^3 volume. There is Fat Layer (Subcutaneous Tissue) Exposed exposed. There is no tunneling or undermining noted. There is [name] medium amount of serosanguineous drainage noted. The wound margin  is distinct with the outline attached to the wound base. There is large (67-100%) red, pink granulation within the wound bed.  There is no necrotic tissue within the wound bed. Wound #42 status is Open. Original cause of wound was Gradually Appeared. The wound is located on the Right,Dorsal Ankle. The wound measures 0.6cm length x 0.6cm width x 0.3cm depth; 0.283cm^2 area and 0.085cm^3 volume. There is Fat Layer (Subcutaneous Tissue) Exposed exposed. There is no tunneling or undermining noted. There is [name] small amount of serosanguineous drainage noted. There is no granulation within the wound bed. There is [name] large (67- 100%) amount of necrotic tissue within the wound bed including Eschar and Adherent Slough. Wound #43 status is Open. Original cause of wound was Gradually Appeared. The wound is located on the Left,Dorsal Foot. The wound measures 0.1cm length x 0.1cm width x 0.1cm depth; 0.008cm^2 area and 0.001cm^3 volume. There is no tunneling or undermining noted. There is [name] small amount of serosanguineous drainage noted. There is no granulation within the wound bed. There is [name] large (67-100%) amount of necrotic tissue within the wound bed including Adherent [place]. 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 There was [name] Excisional Skin/Subcutaneous Tissue Debridement with [name] total area of 0.36 sq cm performed by [name] [name]., MD. With the following instrument(s):  Curette to remove Viable and Non-Viable tissue/material. Material removed includes Subcutaneous Tissue. No specimens were taken. [name] time out was conducted at 08:20, prior to the start of the procedure. [name] Minimum amount of bleeding was controlled with Pressure. The procedure was tolerated well with [name] pain level of 0 throughout and [name] pain level of 0 following the procedure. Post Debridement Measurements: 0.6cm length x 0.6cm width x 0.3cm depth; 0.085cm^3 volume. Character of Wound/Ulcer Post Debridement is improved. Severity of Tissue Post Debridement is: Fat layer exposed. Post procedure Diagnosis Wound #42: Same as Pre-Procedure Wound #43 Pre-procedure diagnosis of Wound #43 is an Inflammatory located on the Left,Dorsal Foot . There was [name] Excisional Skin/Subcutaneous Tissue Debridement with [name] total area of 1 sq cm performed by [name] [name]., MD. With the following instrument(s): Curette to remove Viable and Non-Viable tissue/material. Material removed includes Callus, Subcutaneous Tissue, and Skin: Epidermis. No specimens were taken. [name] time out was conducted at 08:20, prior to the start of the procedure. [name] Minimum amount of bleeding was controlled with Pressure. The procedure was tolerated well with [name] pain level of 0 throughout and [name] pain level of 0 following the procedure. Post Debridement Measurements: 0.1cm length x 0.1cm width x 0.1cm depth; 0.001cm^3 volume. Character of Wound/Ulcer Post Debridement is improved. Post procedure Diagnosis Wound #43: Same as Pre-Procedure Wound #37R Pre-procedure diagnosis of Wound #37R is [name] Venous Leg Ulcer located on the Left,Lateral Malleolus . There was [name] Four Layer Compression Therapy Procedure by Levan Hurst, RN. Post procedure Diagnosis Wound #37R: Same as Pre-Procedure Plan Follow-up Appointments: Return Appointment  in 2 weeks. - MD visit Nurse Visit: - 1 week Dressing Change Frequency: Wound #37R Left,Lateral Malleolus: Do not change entire  dressing for one week. Wound #38 Right T - Web between 1st and 2nd: oe Change Dressing every other day. Wound #41 Left Ischium: Change Dressing every other day. Wound #42 Right,Dorsal Ankle: Change Dressing every other day. Wound #43 Left,Dorsal Foot: Do not change entire dressing for one week. Skin Barriers/Peri-Wound Care: Moisturizing lotion - both legs Wound Cleansing: May shower with protection. - use cast protector Primary Wound Dressing: Wound #37R Left,Lateral Malleolus: Calcium Alginate with Silver Other: - Mupirocin under alginate Wound #38 Right T - Web between 1st and 2nd: oe Calcium Alginate with Silver Wound #41 Left Ischium: Hydrofera Blue Wound #42 Right,Dorsal Ankle: Calcium Alginate with Silver Wound #43 Left,Dorsal Foot: Calcium Alginate with Silver Secondary Dressing: Wound #37R Left,Lateral Malleolus: Dry Gauze Wound #41 Left Ischium: Dry Gauze ABD pad Wound #42 Right,Dorsal Ankle: Kerlix/Rolled Gauze - secure with tape Dry Gauze Wound #43 Left,Dorsal Foot: Dry Gauze Wound #38 Right T - Web between 1st and 2nd: oe Kerlix/Rolled Gauze Dry Gauze Edema Control: 4 layer compression: Left lower extremity Elevate legs to the level of the heart or above for 30 minutes daily and/or when sitting, [name] frequency of: - throughout the day Support Garment 30-40 mm/Hg pressure to: - Juxtalite to right leg daily Off-Loading: Low air-loss mattress (Group 2) Roho cushion for wheelchair Turn and reposition every 2 hours - out of wheelchair throughout the day, try to lay on sides, sleep in the bed not the recliner 1. We continue with silver alginate to all wounds except for the left buttock where we are using Hydrofera Blue 2. I had some samples of Nuzyra I think I gave him enough for 4-1/2 days. We put Bactroban on the wound on the ankle. 3. I think he has severe bilateral venous insufficiency on top of his paraplegia he. He has had previous ablations Electronic  Signature(s) Signed: 06/16/2020 6:00:25 PM By: Linton Ham MD Entered By: Linton Ham on 06/16/2020 08:49:15 -------------------------------------------------------------------------------- SuperBill Details Patient Name: Date of Service: [name] [name] [name] [name] Medical Record Number: [id number] Patient Account Number: [id number] Date of Birth/Sex: Treating RN: 12-27-1985 (34 y.o. [name] [name] Primary Care Provider: [place], [place] Other Clinician: Referring Provider: Treating Provider/Extender: [name]: 232 Diagnosis Coding ICD-10 Codes Code Description I87.332 Chronic venous hypertension (idiopathic) with ulcer and inflammation of left lower extremity L97.321 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 to breakdown of skin L97.311 Non-pressure chronic ulcer of right ankle limited to breakdown of skin L97.511 Non-pressure chronic ulcer of other part of right foot limited to breakdown of skin L97.521 Non-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 L03.116 Cellulitis of left lower limb 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, 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, complete Facility Procedures CPT4 Code: [id number] IC Description: 042 - DEB SUBQ TISSUE 20 SQ CM/< D-10 Diagnosis Description L97.521 Non-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 L97.311 Non-pressure chronic ulcer of right ankle limited to breakdown of skin Modifier: 1 Quantity: Physician Procedures : CPT4 Code Description Modifier [id number] - WC PHYS SUBQ TISS 20 SQ CM ICD-10 Diagnosis Description L97.521 Non-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 L97.311 Non-pressure chronic ulcer of right ankle limited  to breakdown of skin Quantity: 1 Electronic Signature(s) Signed: 06/16/2020 6:00:25  PM By: Linton Ham MD Entered By: Linton Ham on 06/16/2020  08:50:58

## 2020-06-18 NOTE — Progress Notes (Signed)
Cadogan, [name] ([id number]) Visit Report for 06/16/2020 Arrival Information Details Patient Name: Date of Service: [name] 06/16/2020 7:30 A M Medical Record Number: [id number] Patient Account Number: [id number] Date of Birth/Sex: Treating RN: 1986/11/21 (34 y.o. [name]) Carlene Coria Primary Care Sohaib Vereen: [name], [name] Other Clinician: Referring Yeiren Whitecotton: Treating Macil Crady/Extender: [name]: 38 Visit Information History Since Last Visit All ordered tests and consults were completed: No Patient Arrived: Wheel Chair Added or deleted any medications: No Arrival Time: 07:51 Any new allergies or adverse reactions: No Accompanied By: self Had a fall or experienced change in No Transfer Assistance: None activities of daily living that may affect Patient Identification Verified: Yes risk of falls: Secondary Verification Process Completed: Yes Signs or symptoms of abuse/neglect since last visito No Patient Requires Transmission-Based Precautions: No Hospitalized since last visit: No Patient Has Alerts: Yes Implantable device outside of the clinic excluding No Patient Alerts: R ABI = 1.0 cellular tissue based products placed in the center L ABI = 1.1 since last visit: Has Dressing in Place as Prescribed: Yes Has Compression in Place as Prescribed: Yes Pain Present Now: No Electronic Signature(s) Signed: 06/18/2020 6:04:59 PM By: Carlene Coria RN Entered By: Carlene Coria on 06/16/2020 07:52:02 -------------------------------------------------------------------------------- Compression Therapy Details Patient Name: Date of Service: [name], [name] 06/16/2020 7:30 A M Medical Record Number: [id number] Patient Account Number: [id number] Date of Birth/Sex: Treating RN: 1986-07-18 (34 y.o. [name] Primary Care Edrees Valent: [name], [place] Other Clinician: Referring Braison Snoke: Treating Shakeeta Godette/Extender: [name] in  Treatment: 232 Compression Therapy Performed for Wound Assessment: Wound #[id number] Left,Lateral Malleolus Performed By: Clinician Levan Hurst, RN Compression Type: Four Layer Post Procedure Diagnosis Same as Pre-procedure Electronic Signature(s) Signed: 06/18/2020 6:13:34 PM By: Levan Hurst RN, BSN Entered By: Levan Hurst on 06/16/2020 08:24:30 -------------------------------------------------------------------------------- Encounter Discharge Information Details Patient Name: Date of Service: [name], [name] 06/16/2020 7:30 A M Medical Record Number: [id number] Patient Account Number: [id number] Date of Birth/Sex: Treating RN: 02-11-1986 (34 y.o. [name] Primary Care [name]: Yeager, [place] Other Clinician: Referring Deniece Rankin: Treating [name]/Extender: [name]: 232 Encounter Discharge Information Items Post Procedure Vitals Discharge Condition: Stable Temperature (F): 98.3 Ambulatory Status: Wheelchair Pulse (bpm): 108 Discharge Destination: Home Respiratory Rate (breaths/min): 16 Transportation: Other Blood Pressure (mmHg): 113/65 Accompanied By: self Schedule Follow-up Appointment: Yes Clinical Summary of Care: Patient Declined Electronic Signature(s) Signed: 06/16/2020 6:11:36 PM By: Kela Millin Entered By: Kela Millin on 06/16/2020 08:56:04 -------------------------------------------------------------------------------- Lower Extremity Assessment Details Patient Name: Date of Service: [name], [name] 06/16/2020 7:30 A M Medical Record Number: [id number] Patient Account Number: [id number] Date of Birth/Sex: Treating RN: 12-04-1985 (34 y.o. [name] Primary Care Shlome Baldree: [place], [place] Other Clinician: Referring [name]: Treating Javonni Macke/Extender: [name]: 232 Edema Assessment Assessed: [Left: No] [Right: No] Edema: [Left: No] [Right:  Yes] Calf Left: Right: Point of Measurement: 33 cm From Medial Instep 36 cm 34 cm Ankle Left: Right: Point of Measurement: 10 cm From Medial Instep 23 cm 22 cm Electronic Signature(s) Signed: 06/18/2020 6:04:59 PM By: Carlene Coria RN Entered By: Carlene Coria on 06/16/2020 08:11:11 -------------------------------------------------------------------------------- Multi Wound Chart Details Patient Name: Date of Service: [name], [name] 06/16/2020 7:30 A M Medical Record Number: [id number] Patient Account Number: [id number] Date of Birth/Sex: Treating RN: 19-May-1986 (34 y.o. [name] Primary Care [name]: [place], [place] Other Clinician: Referring [name]: Treating Jillayne Witte/Extender: Dutch Gray,  [name] [name]: 8 Vital Signs Height(in): Pulse(bpm): 108 Weight(lbs): 216 Blood Pressure(mmHg): 113/65 Body Mass Index(BMI): Temperature(F): 98.3 Respiratory Rate(breaths/min): 16 Photos: [37R:No Photos Left, Lateral Malleolus] [38:No Photos Right T - Web between 1st and 2nd Left Ischium oe] [41:No Photos] Wound Location: [37R:Gradually Appeared] [38:Gradually Appeared] [41:Gradually Appeared] Wounding Event: [37R:Venous Leg Ulcer] [38:Inflammatory] [41:Pressure Ulcer] Primary Etiology: [37R:Sleep Apnea, Hypertension, Paraplegia Sleep Apnea, Hypertension, Paraplegia Sleep Apnea, Hypertension, Paraplegia] Comorbid History: [37R:11/26/2019] [38:11/30/2019] [41:03/16/2020] Date Acquired: [37R:29] [38:28] [41:13] Weeks of Treatment: [37R:Open] [38:Open] [41:Open] Wound Status: [37R:Yes] [38:No] [41:No] Wound Recurrence: [37R:0.1x0.1x0.1] [38:1.5x0.8x0.1] [41:2.5x1.4x0.1] Measurements L x W x D (cm) [37R:0.008] [38:0.942] [41:2.749] A (cm) : rea [37R:0.001] [38:0.094] [41:0.275] Volume (cm) : [37R:99.80%] [38:-185.50%] [41:-52.20%] % Reduction in A rea: [37R:99.70%] [38:59.30%] [41:-51.90%] % Reduction in Volume: [37R:Full Thickness Without Exposed] [38:Full  Thickness Without Exposed] [41:Category/Stage II] Classification: [37R:Support Structures Medium] [38:Support Structures Small] [41:Medium] Exudate A mount: [37R:Serosanguineous] [38:Serosanguineous] [41:Serosanguineous] Exudate Type: [37R:red, brown] [38:red, brown] [41:red, brown] Exudate Color: [37R:Flat and Intact] [38:Distinct, outline attached] [41:Distinct, outline attached] Wound Margin: [37R:None Present (0%)] [38:Large (67-100%)] [41:Large (67-100%)] Granulation A mount: [37R:N/A] [38:Red] [41:Red, Pink] Granulation Quality: [37R:Large (67-100%)] [38:Small (1-33%)] [41:None Present (0%)] Necrotic A mount: [37R:Eschar] [38:Adherent Slough] [41:N/A] Necrotic Tissue: [37R:Fascia: No] [38:Fat Layer (Subcutaneous Tissue)] [41:Fat Layer (Subcutaneous Tissue)] Exposed Structures: [37R:Fat Layer (Subcutaneous Tissue) Exposed: No Tendon: No Muscle: No Joint: No Bone: No Large (67-100%)] [38:Exposed: Yes Medium (34-66%)] [41:Exposed: Yes Fascia: No Tendon: No Muscle: No Joint: No Bone: No Medium (34-66%)] Epithelialization: [37R:N/A] [38:N/A] [41:N/A] Debridement: [37R:N/A] [38:N/A] [41:N/A] Tissue Debrided: [37R:N/A] [38:N/A] [41:N/A] Level: [37R:N/A] [38:N/A] [41:N/A] Debridement A (sq cm): [37R:rea N/A] [38:N/A] [41:N/A] Instrument: [37R:N/A] [38:N/A] [41:N/A] Bleeding: [37R:N/A] [38:N/A] [41:N/A] Hemostasis A chieved: [37R:N/A] [38:N/A] [41:N/A] Procedural Pain: [37R:N/A] [38:N/A] [41:N/A] Post Procedural Pain: Debridement Treatment Response: N/A [38:N/A] [41:N/A] Post Debridement Measurements L x N/A [38:N/A] [41:N/A] W x D (cm) [37R:N/A] [38:N/A] [41:N/A] Post Debridement Volume: (cm) [37R:Compression Therapy] [38:N/A] [41:N/A] Wound Number: [id number] N/A Photos: No Photos No Photos N/A Right, Dorsal Ankle Left, Dorsal Foot N/A Wound Location: Gradually Appeared Gradually Appeared N/A Wounding Event: Venous Leg Ulcer Inflammatory N/A Primary Etiology: Sleep Apnea, Hypertension,  Paraplegia Sleep Apnea, Hypertension, Paraplegia N/A Comorbid History: 06/06/2020 06/06/2020 N/A Date Acquired: 1 1 N/A Weeks of Treatment: Open Open N/A Wound Status: No No N/A Wound Recurrence: 0.6x0.6x0.3 0.1x0.1x0.1 N/A Measurements L x W x D (cm) 0.283 0.008 N/A A (cm) : rea 0.085 0.001 N/A Volume (cm) : 76.00% 95.90% N/A % Reduction in A rea: 28.00% 95.00% N/A % Reduction in Volume: Full Thickness Without Exposed Full Thickness Without Exposed N/A Classification: Support Structures Support Structures Small Small N/A Exudate Amount: Serosanguineous Serosanguineous N/A Exudate Type: red, brown red, brown N/A Exudate Color: N/A N/A N/A Wound Margin: None Present (0%) None Present (0%) N/A Granulation Amount: N/A N/A N/A Granulation Quality: Large (67-100%) Large (67-100%) N/A Necrotic Amount: Eschar, Adherent Slough Adherent Slough N/A Necrotic Tissue: Fat Layer (Subcutaneous Tissue) Fascia: No N/A Exposed Structures: Exposed: Yes Fat Layer (Subcutaneous Tissue) Fascia: No Exposed: No Tendon: No Tendon: No Muscle: No Muscle: No Joint: No Joint: No Bone: No Bone: No None None N/A Epithelialization: Debridement - Excisional Debridement - Excisional N/A Debridement: Pre-procedure Verification/Time Out 08:20 08:20 N/A Taken: Subcutaneous Callus, Subcutaneous N/A Tissue Debrided: Skin/Subcutaneous Tissue Skin/Subcutaneous Tissue N/A Level: 0.36 1 N/A Debridement A (sq cm): rea Curette Curette N/A Instrument: Minimum Minimum N/A Bleeding: Pressure Pressure N/A Hemostasis A chieved: 0 0 N/A Procedural Pain: 0 0  N/A Post Procedural Pain: Procedure was tolerated well Procedure was tolerated well N/A Debridement Treatment Response: 0.6x0.6x0.3 0.1x0.1x0.1 N/A Post Debridement Measurements L x W x D (cm) 0.085 0.001 N/A Post Debridement Volume: (cm) Debridement Debridement N/A Procedures Performed: Treatment Notes Electronic  Signature(s) Signed: 06/16/2020 6:00:25 PM By: Linton Ham MD Signed: 06/18/2020 6:13:34 PM By: Levan Hurst RN, BSN Entered By: Linton Ham on 06/16/2020 08:41:27 -------------------------------------------------------------------------------- Multi-Disciplinary Care Plan Details Patient Name: Date of Service: Brodzinski, [name] 06/16/2020 7:30 A M Medical Record Number: [id number] Patient Account Number: [id number] Date of Birth/Sex: Treating RN: [birth year] (34 y.o. [name] Primary Care Trayquan Kolakowski: [name], [name] Other Clinician: Referring Roshard Rezabek: Treating Dana Debo/Extender: [name]: 232 Active Inactive Wound/Skin Impairment Nursing Diagnoses: Impaired tissue integrity Knowledge deficit related to ulceration/compromised skin integrity Goals: Patient/caregiver will verbalize understanding of skin care regimen Date Initiated: 01/05/2016 Target Resolution Date: 07/11/2020 Goal Status: Active Ulcer/skin breakdown will have a volume reduction of 30% by week 4 Date Initiated: 01/05/2016 Date Inactivated: 12/22/2017 Target Resolution Date: 01/19/2018 Unmet Reason: complex wounds, Goal Status: Unmet infection Interventions: Assess patient/caregiver ability to obtain necessary supplies Assess ulceration(s) every visit Provide education on ulcer and skin care Notes: Electronic Signature(s) Signed: 06/18/2020 6:13:34 PM By: Levan Hurst RN, BSN Entered By: Levan Hurst on 06/16/2020 08:01:22 -------------------------------------------------------------------------------- Pain Assessment Details Patient Name: Date of Service: Vogl, [name] 06/16/2020 7:30 A M Medical Record Number: [id number] Patient Account Number: [id number] Date of Birth/Sex: Treating RN: 1986/08/15 (34 y.o. [name] Primary Care Ishita Mcnerney: [place], [place] Other Clinician: Referring Romelle Reiley: Treating Arabel Barcenas/Extender: [name]: 232 Active Problems Location of Pain Severity and Description of Pain Patient Has Paino No Site Locations Pain Management and Medication Current Pain Management: Electronic Signature(s) Signed: 06/18/2020 6:04:59 PM By: Carlene Coria RN Entered By: Carlene Coria on 06/16/2020 07:52:33 -------------------------------------------------------------------------------- Patient/Caregiver Education Details Patient Name: Date of Service: Mathe, A Viviann Spare 7/26/2021andnbsp7:30 A M Medical Record Number: [id number] Patient Account Number: [id number] Date of Birth/Gender: Treating RN: June 25, 1986 (34 y.o. [name] Primary Care Physician: Janine Limbo Other Clinician: Referring Physician: Treating Physician/Extender: [name]: 4 Education Assessment Education Provided To: Patient Education Topics Provided Wound/Skin Impairment: Methods: Explain/Verbal Responses: State content correctly Electronic Signature(s) Signed: 06/18/2020 6:13:34 PM By: Levan Hurst RN, BSN Entered By: Levan Hurst on 06/16/2020 08:01:38 -------------------------------------------------------------------------------- Wound Assessment Details Patient Name: Date of Service: Accardo, [name] 06/16/2020 7:30 A M Medical Record Number: [id number] Patient Account Number: [id number] Date of Birth/Sex: Treating RN: 07/16/1986 (34 y.o. [name] Primary Care Janila Arrazola: [name], [name] Other Clinician: Referring Hermes Wafer: Treating Tkai Large/Extender: [name]: 232 Wound Status Wound Number: [id number] Primary Etiology: Venous Leg Ulcer Wound Location: Left, Lateral Malleolus Wound Status: Open Wounding Event: Gradually Appeared Comorbid History: Sleep Apnea, Hypertension, Paraplegia Date Acquired: 11/26/2019 Weeks Of Treatment: 29 Clustered Wound: No Photos Photo Uploaded By: Mikeal Hawthorne on 06/16/2020  16:09:15 Wound Measurements Length: (cm) 0.1 Width: (cm) 0.1 Depth: (cm) 0.1 Area: (cm) 0.008 Volume: (cm) 0.001 % Reduction in Area: 99.8% % Reduction in Volume: 99.7% Epithelialization: Large (67-100%) Tunneling: No Undermining: No Wound Description Classification: Full Thickness Without Exposed Support Structures Wound Margin: Flat and Intact Exudate Amount: Medium Exudate Type: Serosanguineous Exudate Color: red, brown Foul Odor After Cleansing: No Slough/Fibrino Yes Wound Bed Granulation Amount: None Present (0%) Exposed Structure Necrotic Amount: Large (67-100%) Fascia Exposed: No Necrotic Quality: Eschar  Fat Layer (Subcutaneous Tissue) Exposed: No Tendon Exposed: No Muscle Exposed: No Joint Exposed: No Bone Exposed: No Treatment Notes Wound #[id number] (Left, Lateral Malleolus) 1. Cleanse With Wound Cleanser Soap and water 3. Primary Dressing Applied Calcium Alginate Other primary dressing (specifiy in notes) 4. Secondary Dressing Dry Gauze 6. Support Layer Applied 4 layer compression Water quality scientist) Signed: 06/18/2020 6:04:59 PM By: Carlene Coria RN Entered By: Carlene Coria on 06/16/2020 08:13:20 -------------------------------------------------------------------------------- Wound Assessment Details Patient Name: Date of Service: Gillum, [name] 06/16/2020 7:30 A M Medical Record Number: [id number] Patient Account Number: [id number] Date of Birth/Sex: Treating RN: August 27, 1986 (34 y.o. [name]) Carlene Coria Primary Care Savior Himebaugh: [name], [name] Other Clinician: Referring Cambridge Deleo: Treating Aaliayah Miao/Extender: [name]: 232 Wound Status Wound Number: [id number] Primary Etiology: Inflammatory Wound Location: Right T - Web between 1st and 2nd oe Wound Status: Open Wounding Event: Gradually Appeared Comorbid History: Sleep Apnea, Hypertension, Paraplegia Date Acquired: 11/30/2019 Weeks Of Treatment: 28 Clustered Wound:  No Photos Photo Uploaded By: Mikeal Hawthorne on 06/16/2020 16:08:49 Wound Measurements Length: (cm) 1.5 Width: (cm) 0.8 Depth: (cm) 0.1 Area: (cm) 0.942 Volume: (cm) 0.094 % Reduction in Area: -185.5% % Reduction in Volume: 59.3% Epithelialization: Medium (34-66%) Tunneling: No Undermining: No Wound Description Classification: Full Thickness Without Exposed Support Structures Wound Margin: Distinct, outline attached Exudate Amount: Small Exudate Type: Serosanguineous Exudate Color: red, brown Foul Odor After Cleansing: No Slough/Fibrino Yes Wound Bed Granulation Amount: Large (67-100%) Exposed Structure Granulation Quality: Red Fat Layer (Subcutaneous Tissue) Exposed: Yes Necrotic Amount: Small (1-33%) Necrotic Quality: Adherent Slough Treatment Notes Wound #38 (Right Toe - Web between 1st and 2nd) 1. Cleanse With Wound Cleanser Soap and water 2. Periwound Care Moisturizing lotion 3. Primary Dressing Applied Calcium Alginate Ag 4. Secondary Dressing Dry Gauze Roll Gauze 5. Secured With Recruitment consultant) Signed: 06/18/2020 6:04:59 PM By: Carlene Coria RN Entered By: Carlene Coria on 06/16/2020 08:13:48 -------------------------------------------------------------------------------- Wound Assessment Details Patient Name: Date of Service: Hammes, [name] 06/16/2020 7:30 A M Medical Record Number: [id number] Patient Account Number: [id number] Date of Birth/Sex: Treating RN: 01-06-1986 (34 y.o. [name]) Carlene Coria Primary Care Asante Blanda: [name], [name] Other Clinician: Referring Palmira Stickle: Treating Aronda Burford/Extender: Dutch Gray, [name] [name]: 232 Wound Status Wound Number: [id number] Primary Etiology: Pressure Ulcer Wound Location: Left Ischium Wound Status: Open Wounding Event: Gradually Appeared Comorbid History: Sleep Apnea, Hypertension, Paraplegia Date Acquired: 03/16/2020 Weeks Of Treatment: 13 Clustered Wound: No Photos Photo  Uploaded By: Mikeal Hawthorne on 06/16/2020 16:09:37 Wound Measurements Length: (cm) 2.5 Width: (cm) 1.4 Depth: (cm) 0.1 Area: (cm) 2.749 Volume: (cm) 0.275 % Reduction in Area: -52.2% % Reduction in Volume: -51.9% Epithelialization: Medium (34-66%) Tunneling: No Undermining: No Wound Description Classification: Category/Stage II Wound Margin: Distinct, outline attached Exudate Amount: Medium Exudate Type: Serosanguineous Exudate Color: red, brown Foul Odor After Cleansing: No Slough/Fibrino No Wound Bed Granulation Amount: Large (67-100%) Exposed Structure Granulation Quality: Red, Pink Fascia Exposed: No Necrotic Amount: None Present (0%) Fat Layer (Subcutaneous Tissue) Exposed: Yes Tendon Exposed: No Muscle Exposed: No Joint Exposed: No Bone Exposed: No Treatment Notes 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 Cleanse With Wound Cleanser 3. Primary Dressing Applied Hydrofera Blue 4. Secondary Dressing Foam Border Dressing Electronic Signature(s) Signed: 06/18/2020 6:04:59 PM By: Carlene Coria RN Entered By: Carlene Coria on 06/16/2020 08:14:15 -------------------------------------------------------------------------------- Wound Assessment Details Patient Name: Date of Service: Popelka, [name] 06/16/2020 7:30 A M Medical Record Number: [id number] Patient Account Number: [id number] Date of Birth/Sex:  Treating RN: 01/11/1986 (34 y.o. [name]) Carlene Coria Primary Care Mosella Kasa: [name], [name] Other Clinician: Referring Anayely Constantine: Treating Kayler Buckholtz/Extender: [name]: 232 Wound Status Wound Number: [id number] Primary Etiology: Venous Leg Ulcer Wound Location: Right, Dorsal Ankle Wound Status: Open Wounding Event: Gradually Appeared Comorbid History: Sleep Apnea, Hypertension, Paraplegia Date Acquired: 06/06/2020 Weeks Of Treatment: 1 Clustered Wound: No Photos Photo Uploaded By: Mikeal Hawthorne on 06/16/2020 16:08:50 Wound Measurements Length:  (cm) 0.6 Width: (cm) 0.6 Depth: (cm) 0.3 Area: (cm) 0.283 Volume: (cm) 0.085 % Reduction in Area: 76% % Reduction in Volume: 28% Epithelialization: None Tunneling: No Undermining: No Wound Description Classification: Full Thickness Without Exposed Support Struct Exudate Amount: Small Exudate Type: Serosanguineous Exudate Color: red, brown ures Foul Odor After Cleansing: No Slough/Fibrino Yes Wound Bed Granulation Amount: None Present (0%) Exposed Structure Necrotic Amount: Large (67-100%) Fascia Exposed: No Necrotic Quality: Eschar, Adherent Slough Fat Layer (Subcutaneous Tissue) Exposed: Yes Tendon Exposed: No Muscle Exposed: No Joint Exposed: No Bone Exposed: No Treatment Notes Wound #42 (Right, Dorsal Ankle) 1. Cleanse With Wound Cleanser Soap and water 2. Periwound Care Moisturizing lotion 3. Primary Dressing Applied Calcium Alginate Ag 4. Secondary Dressing Dry Gauze Roll Gauze 5. Secured With Recruitment consultant) Signed: 06/18/2020 6:04:59 PM By: Carlene Coria RN Entered By: Carlene Coria on 06/16/2020 08:14:44 -------------------------------------------------------------------------------- Wound Assessment Details Patient Name: Date of Service: Delbridge, [name] 06/16/2020 7:30 A M Medical Record Number: [id number] Patient Account Number: [id number] Date of Birth/Sex: Treating RN: 1986/11/06 (34 y.o. [name]) Carlene Coria Primary Care Lamberto Dinapoli: [name], [name] Other Clinician: Referring Koua Deeg: Treating Mitsy Owen/Extender: [name]: 232 Wound Status Wound Number: 43 Primary Etiology: Inflammatory Wound Location: Left, Dorsal Foot Wound Status: Open Wounding Event: Gradually Appeared Comorbid History: Sleep Apnea, Hypertension, Paraplegia Date Acquired: 06/06/2020 Weeks Of Treatment: 1 Clustered Wound: No Photos Photo Uploaded By: Mikeal Hawthorne on 06/16/2020 16:09:16 Wound Measurements Length: (cm)  0.1 Width: (cm) 0.1 Depth: (cm) 0.1 Area: (cm) 0.008 Volume: (cm) 0.001 % Reduction in Area: 95.9% % Reduction in Volume: 95% Epithelialization: None Tunneling: No Undermining: No Wound Description Classification: Full Thickness Without Exposed Support Structures Exudate Amount: Small Exudate Type: Serosanguineous Exudate Color: red, brown Foul Odor After Cleansing: No Slough/Fibrino Yes Wound Bed Granulation Amount: None Present (0%) Exposed Structure Necrotic Amount: Large (67-100%) Fascia Exposed: No Necrotic Quality: Adherent Slough Fat Layer (Subcutaneous Tissue) Exposed: No Tendon Exposed: No Muscle Exposed: No Joint Exposed: No Bone Exposed: No Treatment Notes Wound #43 (Left, Dorsal Foot) 1. Cleanse With Wound Cleanser Soap and water 3. Primary Dressing Applied Calcium Alginate Other primary dressing (specifiy in notes) 4. Secondary Dressing Dry Gauze 6. Support Layer Applied 4 layer compression wrap Electronic Signature(s) Signed: 06/18/2020 6:04:59 PM By: Carlene Coria RN Entered By: Carlene Coria on 06/16/2020 08:15:06 -------------------------------------------------------------------------------- [place] Details Patient Name: Date of Service: Delosreyes, [name] 06/16/2020 7:30 A M Medical Record Number: [id number] Patient Account Number: [id number] Date of Birth/Sex: Treating RN: 1986-02-13 (34 y.o. [name]) Carlene Coria Primary Care Johnpaul Gillentine: [place], [place] Other Clinician: Referring Shealynn Saulnier: Treating Dianca Owensby/Extender: [name]: 232 Vital Signs Time Taken: 07:52 Temperature (F): 98.3 Weight (lbs): 216 Pulse (bpm): 108 Respiratory Rate (breaths/min): 16 Blood Pressure (mmHg): 113/65 Reference Range: 80 - 120 mg / dl Electronic Signature(s) Signed: 06/18/2020 6:04:59 PM By: Carlene Coria RN Entered By: Carlene Coria on 06/16/2020 07:52:24

## 2020-06-21 ENCOUNTER — Other Ambulatory Visit: Payer: Self-pay | Admitting: Psychiatry

## 2020-06-21 DIAGNOSIS — F341 Dysthymic disorder: Secondary | ICD-10-CM

## 2020-06-23 ENCOUNTER — Encounter (HOSPITAL_BASED_OUTPATIENT_CLINIC_OR_DEPARTMENT_OTHER): Payer: BC Managed Care – PPO | Attending: Internal Medicine | Admitting: Internal Medicine

## 2020-06-23 DIAGNOSIS — L97311 Non-pressure chronic ulcer of right ankle limited to breakdown of skin: Secondary | ICD-10-CM | POA: Insufficient documentation

## 2020-06-23 DIAGNOSIS — L97521 Non-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: Secondary | ICD-10-CM | POA: Insufficient documentation

## 2020-06-23 DIAGNOSIS — G8221 Paraplegia, complete: Secondary | ICD-10-CM | POA: Diagnosis not present

## 2020-06-23 DIAGNOSIS — L97321 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 to breakdown of skin: Secondary | ICD-10-CM | POA: Insufficient documentation

## 2020-06-23 DIAGNOSIS — I87332 Chronic venous hypertension (idiopathic) with ulcer and inflammation of left lower extremity: Secondary | ICD-10-CM | POA: Insufficient documentation

## 2020-06-23 DIAGNOSIS — L89323 Pressure ulcer of left buttock, stage 3: Secondary | ICD-10-CM | POA: Diagnosis not present

## 2020-06-23 DIAGNOSIS — L03116 Cellulitis of left lower limb: Secondary | ICD-10-CM | POA: Diagnosis not present

## 2020-06-23 DIAGNOSIS — Z89422 Acquired absence of other left toe(s): Secondary | ICD-10-CM | POA: Insufficient documentation

## 2020-06-23 DIAGNOSIS — L97511 Non-pressure chronic ulcer of other part of right foot limited to breakdown of skin: Secondary | ICD-10-CM | POA: Diagnosis not present

## 2020-06-23 NOTE — Progress Notes (Signed)
[place], ALEXANDRU MOORER ([id number]) Visit Report for 06/23/2020 Arrival Information Details Patient Name: Date of Service: [place]el, [name] 06/23/2020 8:00 A M Medical Record Number: [id number] Patient Account Number: [id number] Date of Birth/Sex: Treating RN: Mar 22, 1986 (34 y.o. [name] Primary Care Abdulkarim Eberlin: [name], [name] Other Clinician: Referring Barnabas Henriques: Treating Jaidyn Kuhl/Extender: [name]: 54 Visit Information History Since Last Visit Added or deleted any medications: No Patient Arrived: Wheel Chair Any new allergies or adverse reactions: No Arrival Time: 08:07 Had a fall or experienced change in No Accompanied By: self activities of daily living that may affect Transfer Assistance: None risk of falls: Patient Requires Transmission-Based Precautions: No Signs or symptoms of abuse/neglect since last visito No Patient Has Alerts: Yes Hospitalized since last visit: No Patient Alerts: R ABI = 1.0 Implantable device outside of the clinic excluding No L ABI = 1.1 cellular tissue based products placed in the center since last visit: Has Dressing in Place as Prescribed: Yes Has Compression in Place as Prescribed: Yes Pain Present Now: No Electronic Signature(s) Signed: 06/23/2020 12:26:20 PM By: Kela Millin Entered By: Kela Millin on 06/23/2020 08:07:36 -------------------------------------------------------------------------------- Compression Therapy Details Patient Name: Date of Service: [place], [name] 06/23/2020 8:00 A M Medical Record Number: [id number] Patient Account Number: [id number] Date of Birth/Sex: Treating RN: Sep 07, 1986 (34 y.o. [name] Primary Care Cora Stetson: [place], [place] Other Clinician: Referring Deyci Gesell: Treating Sheva Mcdougle/Extender: [name]: 233 Compression Therapy Performed for Wound Assessment: Wound #[id number] Left,Lateral Malleolus Performed By:  Clinician Kela Millin, RN Compression Type: Four Layer Electronic Signature(s) Signed: 06/23/2020 12:26:20 PM By: Kela Millin Entered By: Kela Millin on 06/23/2020 08:18:52 -------------------------------------------------------------------------------- Compression Therapy Details Patient Name: Date of Service: [place], [name] 06/23/2020 8:00 A M Medical Record Number: [id number] Patient Account Number: [id number] Date of Birth/Sex: Treating RN: 1985/11/25 (34 y.o. [name] Primary Care Lenton Gendreau: [place], [place] Other Clinician: Referring Cyann Venti: Treating Airen Stiehl/Extender: [name]: 233 Compression Therapy Performed for Wound Assessment: Wound #43 Left,Dorsal Foot Performed By: Clinician Kela Millin, RN Compression Type: Four Layer Electronic Signature(s) Signed: 06/23/2020 12:26:20 PM By: Kela Millin Entered By: Kela Millin on 06/23/2020 08:18:52 -------------------------------------------------------------------------------- Encounter Discharge Information Details Patient Name: Date of Service: [place], [name] 06/23/2020 8:00 A M Medical Record Number: [id number] Patient Account Number: [id number] Date of Birth/Sex: Treating RN: September 20, 1986 (34 y.o. [name] Primary Care Anndee Connett: [place], [place] Other Clinician: Referring Leanda Padmore: Treating Elena Cothern/Extender: [name]: 233 Encounter Discharge Information Items Discharge Condition: Stable Ambulatory Status: Wheelchair Discharge Destination: Home Transportation: Private Auto Accompanied By: self Schedule Follow-up Appointment: Yes Clinical Summary of Care: Patient Declined Electronic Signature(s) Signed: 06/23/2020 12:26:20 PM By: Kela Millin Entered By: Kela Millin on 06/23/2020  08:25:30 -------------------------------------------------------------------------------- Patient/Caregiver Education Details Patient Name: Date of Service: [place], [name] 8/2/2021andnbsp8:00 A M Medical Record Number: [id number] Patient Account Number: [id number] Date of Birth/Gender: Treating RN: 1986/10/30 (34 y.o. [name] Primary Care Physician: Janine Limbo Other Clinician: Referring Physician: Treating Physician/Extender: [name]: 70 Education Assessment Education Provided To: Patient Education Topics Provided Wound/Skin Impairment: Handouts: Caring for Your Ulcer Methods: Explain/Verbal Responses: State content correctly Electronic Signature(s) Signed: 06/23/2020 12:26:20 PM By: Kela Millin Entered By: Kela Millin on 06/23/2020 08:25:14 -------------------------------------------------------------------------------- Wound Assessment Details Patient Name: Date of Service: [place], [name] 06/23/2020 8:00 A M Medical Record Number: [id number] Patient Account Number: [id number]  Date of Birth/Sex: Treating RN: 1986-11-01 (34 y.o. [name] Primary Care Cashis Rill: [name], [name] Other Clinician: Referring Kandis Henry: Treating Ladeana Laplant/Extender: [name]: 233 Wound Status Wound Number: [id number] Primary Etiology: Venous Leg Ulcer Wound Location: Left, Lateral Malleolus Wound Status: Open Wounding Event: Gradually Appeared Comorbid History: Sleep Apnea, Hypertension, Paraplegia Date Acquired: 11/26/2019 Weeks Of Treatment: 30 Clustered Wound: No Wound Measurements Length: (cm) 0.1 Width: (cm) 0.1 Depth: (cm) 0.1 Area: (cm) 0.008 Volume: (cm) 0.001 % Reduction in Area: 99.8% % Reduction in Volume: 99.7% Epithelialization: Large (67-100%) Tunneling: No Undermining: No Wound Description Classification: Full Thickness Without Exposed Support  Structures Wound Margin: Flat and Intact Exudate Amount: Medium Exudate Type: Serosanguineous Exudate Color: red, brown Foul Odor After Cleansing: No Slough/Fibrino Yes Wound Bed Granulation Amount: None Present (0%) Exposed Structure Necrotic Amount: Large (67-100%) Fascia Exposed: No Necrotic Quality: Eschar Fat Layer (Subcutaneous Tissue) Exposed: No Tendon Exposed: No Muscle Exposed: No Joint Exposed: No Bone Exposed: No Treatment Notes Wound #37R (Left, Lateral Malleolus) 1. Cleanse With Wound Cleanser Soap and water 3. Primary Dressing Applied Calcium Alginate Ag Other primary dressing (specifiy in notes) 4. Secondary Dressing Dry Gauze 6. Support Layer Applied 4 layer compression wrap Notes mupirocin under alginate Electronic Signature(s) Signed: 06/23/2020 12:26:20 PM By: Kela Millin Entered By: Kela Millin on 06/23/2020 08:15:40 -------------------------------------------------------------------------------- Wound Assessment Details Patient Name: Date of Service: [place], [name] 06/23/2020 8:00 A M Medical Record Number: [id number] Patient Account Number: [id number] Date of Birth/Sex: Treating RN: [birth year] (34 y.o. [name] Primary Care Nicola Heinemann: [name], [name] Other Clinician: Referring Ralph Benavidez: Treating Jasmain Ahlberg/Extender: [name]: 233 Wound Status Wound Number: 38 Primary Etiology: Inflammatory Wound Location: Right T - Web between 1st and 2nd oe Wound Status: Open Wounding Event: Gradually Appeared Comorbid History: Sleep Apnea, Hypertension, Paraplegia Date Acquired: 11/30/2019 Weeks Of Treatment: 29 Clustered Wound: No Wound Measurements Length: (cm) 1.5 Width: (cm) 0.8 Depth: (cm) 0.1 Area: (cm) 0.942 Volume: (cm) 0.094 % Reduction in Area: -185.5% % Reduction in Volume: 59.3% Epithelialization: Medium (34-66%) Tunneling: No Undermining: No Wound  Description Classification: Full Thickness Without Exposed Support Structures Wound Margin: Distinct, outline attached Exudate Amount: Small Exudate Type: Serosanguineous Exudate Color: red, brown Foul Odor After Cleansing: No Slough/Fibrino Yes Wound Bed Granulation Amount: Large (67-100%) Exposed Structure Granulation Quality: Red Fat Layer (Subcutaneous Tissue) Exposed: Yes Necrotic Amount: Small (1-33%) Necrotic Quality: Adherent Slough Treatment Notes Wound #38 (Right Toe - Web between 1st and 2nd) 1. Cleanse With Wound Cleanser 3. Primary Dressing Applied Calcium Alginate Ag 4. Secondary Dressing ABD Pad Roll Gauze 5. Secured With Recruitment consultant) Signed: 06/23/2020 12:26:20 PM By: Kela Millin Entered By: Kela Millin on 06/23/2020 08:16:27 -------------------------------------------------------------------------------- Wound Assessment Details Patient Name: Date of Service: [place], [name] 06/23/2020 8:00 A M Medical Record Number: [id number] Patient Account Number: [id number] Date of Birth/Sex: Treating RN: 10-22-1986 (34 y.o. [name] Primary Care Akshay Spang: [place], [place] Other Clinician: Referring Kashden Deboy: Treating Camyla Camposano/Extender: [name]: 233 Wound Status Wound Number: 41 Primary Etiology: Pressure Ulcer Wound Location: Left Ischium Wound Status: Open Wounding Event: Gradually Appeared Comorbid History: Sleep Apnea, Hypertension, Paraplegia Date Acquired: 03/16/2020 Weeks Of Treatment: 14 Clustered Wound: No Wound Measurements Length: (cm) 2.5 Width: (cm) 1.4 Depth: (cm) 0.1 Area: (cm) 2.749 Volume: (cm) 0.275 % Reduction in Area: -52.2% % Reduction in Volume: -51.9% Epithelialization: Medium (34-66%) Tunneling: No Undermining: No Wound Description Classification: Category/Stage  II Wound Margin: Distinct, outline attached Exudate Amount: Medium Exudate Type:  Serosanguineous Exudate Color: red, brown Foul Odor After Cleansing: No Slough/Fibrino No Wound Bed Granulation Amount: Large (67-100%) Exposed Structure Granulation Quality: Red, Pink Fascia Exposed: No Necrotic Amount: None Present (0%) Fat Layer (Subcutaneous Tissue) Exposed: Yes Tendon Exposed: No Muscle Exposed: No Joint Exposed: No Bone Exposed: No Treatment Notes 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 Cleanse With Wound Cleanser 3. Primary Dressing Applied Hydrofera Blue 4. Secondary Dressing Foam Border Dressing Electronic Signature(s) Signed: 06/23/2020 12:26:20 PM By: Kela Millin Entered By: Kela Millin on 06/23/2020 08:17:18 -------------------------------------------------------------------------------- Wound Assessment Details Patient Name: Date of Service: [place], [name] 06/23/2020 8:00 A M Medical Record Number: [id number] Patient Account Number: [id number] Date of Birth/Sex: Treating RN: 1986/01/25 (34 y.o. [name] Primary Care Fitzhugh Vizcarrondo: [name], [name] Other Clinician: Referring Cassie Henkels: Treating Khyren Hing/Extender: [name]: 233 Wound Status Wound Number: 42 Primary Etiology: Venous Leg Ulcer Wound Location: Right, Dorsal Ankle Wound Status: Open Wounding Event: Gradually Appeared Comorbid History: Sleep Apnea, Hypertension, Paraplegia Date Acquired: 06/06/2020 Weeks Of Treatment: 2 Clustered Wound: No Wound Measurements Length: (cm) 0.6 Width: (cm) 0.6 Depth: (cm) 0.3 Area: (cm) 0.283 Volume: (cm) 0.085 % Reduction in Area: 76% % Reduction in Volume: 28% Epithelialization: None Tunneling: No Undermining: No Wound Description Classification: Full Thickness Without Exposed Support Structures Exudate Amount: Small Exudate Type: Serosanguineous Exudate Color: red, brown Foul Odor After Cleansing: No Slough/Fibrino Yes Wound Bed Granulation Amount: None Present (0%) Exposed  Structure Necrotic Amount: Large (67-100%) Fascia Exposed: No Necrotic Quality: Eschar, Adherent Slough Fat Layer (Subcutaneous Tissue) Exposed: Yes Tendon Exposed: No Muscle Exposed: No Joint Exposed: No Bone Exposed: No Treatment Notes Wound #42 (Right, Dorsal Ankle) 1. Cleanse With Wound Cleanser 3. Primary Dressing Applied Calcium Alginate Ag 4. Secondary Dressing ABD Pad Roll Gauze 5. Secured With Recruitment consultant) Signed: 06/23/2020 12:26:20 PM By: Kela Millin Entered By: Kela Millin on 06/23/2020 08:17:59 -------------------------------------------------------------------------------- Wound Assessment Details Patient Name: Date of Service: [place], [name] 06/23/2020 8:00 A M Medical Record Number: [id number] Patient Account Number: [id number] Date of Birth/Sex: Treating RN: 17-Oct-1986 (34 y.o. [name] Primary Care Rami Waddle: [name], [name] Other Clinician: Referring Sosha Shepherd: Treating Kemet Nijjar/Extender: [name]: 233 Wound Status Wound Number: 43 Primary Etiology: Inflammatory Wound Location: Left, Dorsal Foot Wound Status: Open Wounding Event: Gradually Appeared Comorbid History: Sleep Apnea, Hypertension, Paraplegia Date Acquired: 06/06/2020 Weeks Of Treatment: 2 Clustered Wound: No Wound Measurements Length: (cm) 0.1 Width: (cm) 0.1 Depth: (cm) 0.1 Area: (cm) 0.008 Volume: (cm) 0.001 % Reduction in Area: 95.9% % Reduction in Volume: 95% Epithelialization: None Tunneling: No Undermining: No Wound Description Classification: Full Thickness Without Exposed Support Structures Exudate Amount: Small Exudate Type: Serosanguineous Exudate Color: red, brown Foul Odor After Cleansing: No Slough/Fibrino Yes Wound Bed Granulation Amount: None Present (0%) Exposed Structure Necrotic Amount: Large (67-100%) Fascia Exposed: No Necrotic Quality: Adherent Slough Fat Layer (Subcutaneous  Tissue) Exposed: No Tendon Exposed: No Muscle Exposed: No Joint Exposed: No Bone Exposed: No Treatment Notes Wound #43 (Left, Dorsal Foot) 1. Cleanse With Wound Cleanser Soap and water 3. Primary Dressing Applied Calcium Alginate Ag 4. Secondary Dressing Dry Gauze 6. Support Layer Applied 4 layer compression wrap Electronic Signature(s) Signed: 06/23/2020 12:26:20 PM By: Kela Millin Entered By: Kela Millin on 06/23/2020 08:18:20 -------------------------------------------------------------------------------- Vitals Details Patient Name: Date of Service: Mussa, [name] 06/23/2020 8:00 A M Medical Record Number: [id number] Patient  Account Number: [id number] Date of Birth/Sex: Treating RN: [birth year] (34 y.o. [name] Primary Care Arkie Tagliaferro: [place], [place] Other Clinician: Referring Tekia Waterbury: Treating Rayder Sullenger/Extender: [name]: 233 Vital Signs Time Taken: 08:00 Temperature (F): 98.8 Weight (lbs): 216 Pulse (bpm): 105 Respiratory Rate (breaths/min): 18 Blood Pressure (mmHg): 144/79 Reference Range: 80 - 120 mg / dl Electronic Signature(s) Signed: 06/23/2020 12:26:20 PM By: Kela Millin Entered By: Kela Millin on 06/23/2020 08:08:11

## 2020-06-23 NOTE — Telephone Encounter (Signed)
Last visit 10/2019

## 2020-06-24 DIAGNOSIS — I10 Essential (primary) hypertension: Secondary | ICD-10-CM | POA: Diagnosis not present

## 2020-06-24 DIAGNOSIS — R635 Abnormal weight gain: Secondary | ICD-10-CM | POA: Diagnosis not present

## 2020-06-24 DIAGNOSIS — M62838 Other muscle spasm: Secondary | ICD-10-CM | POA: Diagnosis not present

## 2020-06-24 DIAGNOSIS — R350 Frequency of micturition: Secondary | ICD-10-CM | POA: Diagnosis not present

## 2020-06-30 ENCOUNTER — Encounter (HOSPITAL_BASED_OUTPATIENT_CLINIC_OR_DEPARTMENT_OTHER): Payer: BC Managed Care – PPO | Admitting: Internal Medicine

## 2020-06-30 DIAGNOSIS — L97311 Non-pressure chronic ulcer of right ankle limited to breakdown of skin: Secondary | ICD-10-CM | POA: Diagnosis not present

## 2020-06-30 DIAGNOSIS — L97511 Non-pressure chronic ulcer of other part of right foot limited to breakdown of skin: Secondary | ICD-10-CM | POA: Diagnosis not present

## 2020-06-30 DIAGNOSIS — I87332 Chronic venous hypertension (idiopathic) with ulcer and inflammation of left lower extremity: Secondary | ICD-10-CM | POA: Diagnosis not present

## 2020-06-30 DIAGNOSIS — L97521 Non-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: Secondary | ICD-10-CM | POA: Diagnosis not present

## 2020-06-30 DIAGNOSIS — L03116 Cellulitis of left lower limb: Secondary | ICD-10-CM | POA: Diagnosis not present

## 2020-06-30 DIAGNOSIS — G8221 Paraplegia, complete: Secondary | ICD-10-CM | POA: Diagnosis not present

## 2020-06-30 DIAGNOSIS — L89323 Pressure ulcer of left buttock, stage 3: Secondary | ICD-10-CM | POA: Diagnosis not present

## 2020-06-30 DIAGNOSIS — L97321 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 to breakdown of skin: Secondary | ICD-10-CM | POA: Diagnosis not present

## 2020-06-30 DIAGNOSIS — Z89422 Acquired absence of other left toe(s): Secondary | ICD-10-CM | POA: Diagnosis not present

## 2020-06-30 NOTE — Progress Notes (Signed)
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 aggressively for this although we have not made any improvement here. 08/11/17; left lateral leg. Surface continues to improve with the Iodoflex although we are not seeing much improvement in overall wound dimensions. Areas on his plantar left foot and right foot show no improvement. In fact the right foot looks somewhat worse 08/18/17; left lateral leg. We changed to [facility] Blue last week after a prolonged course of Iodoflex which helps get the surface better. It appears that the wound with is improved. Continue with difficult areas on the left dorsal first second and plantar first second on the right  09/01/17; patient arrives in clinic today having had a temperature of 103 yesterday. He was seen in the ER and [facility]. The patient was concerned he could have cellulitis again in the right leg however they diagnosed him with a UTI and he is now on Keflex. He has a history of cellulitis which is been recurrent and difficult but this is been in the left leg, in the past 5 use doxycycline. He does in and out catheterizations at home which are risk factors for UTI 09/08/17; patient will be completing his Keflex this weekend. The erythema on the left leg is considerably better. He has a new  wound today on the medial part of the right leg small superficial almost looks like a skin tear. He has worsening of the area on the right dorsal first and second toe. His major area on the left lateral leg is better. Using Hydrofera Blue on all areas 09/15/17; gradual reduction in width on the long wound in the left lateral leg. No debridement required. He also has wounds on the plantar aspect of his left first second toe web space and on the dorsal aspect of the right first second toe web space. 09/22/17; there continues to be very gradual improvements in the dimensions of the left lateral leg wound. He hasn't round erythematous spot with might be pressure on his wheelchair. There is no evidence obviously of infection no purulence no warmth He has a dry scaled area on the plantar aspect of the left first second toe Improved area on the dorsal right first second toe. 09/29/17; left lateral leg wound continues to improve in dimensions mostly with an is still a fairly long but increasingly narrow wound. He has a dry scaled area on the plantar aspect of his left first second toe web space Increasingly concerning area on the dorsal right first second toe. In fact I am concerned today about possible cellulitis around this wound. The areas extending up his second toe and although there is deformities here almost appears to abut on the nailbed. 10/06/17; left lateral leg wound continues to make very gradual progress. Tissue culture I did from the right first second toe dorsal foot last time grew MRSA and enterococcus which was vancomycin sensitive. This was not sensitive to clindamycin or doxycycline. He is allergic to Zyvox and sulfa we have therefore arrange for him to have dalvance infusion tomorrow. He is had this in the past and tolerated it well 10/20/17; left lateral leg wound continues to make decent progress. This is certainly reduced in terms of with there is advancing epithelialization.The  cellulitis in the right foot looks better although he still has a deep wound in the dorsal aspect of the first second toe web space. Plantar left first toe web space on the left I think is making some progress 10/27/17; left lateral leg wound continues to make decent progress. Advancing epithelialization.using Hydrofera Blue The right first second toe web space wound is better-looking using silver alginate Improvement in the left plantar first second toe web space. Again using silver alginate 11/03/17 left lateral leg wound continues to make decent progress albeit slowly. Using [facility] The right per second toe web space continues to be a very problematic looking punched out wound. I obtained a piece of tissue for deep culture I did extensively treated this for fungus. It is difficult to imagine that this is a pressure area as the patient states other than going outside he doesn't really wear shoes at home The  left plantar first second toe web space looked fairly senescent. Necrotic edges. This required debridement change to [facility] Blue to all wound areas 11/10/17; left lateral leg wound continues to contract. Using Hydrofera Blue On the right dorsal first second toe web space dorsally. Culture I did of this area last week grew MRSA there is not an easy oral option in this patient was multiple antibiotic allergies or intolerances. This was only a rare culture isolate I'm therefore going to use Bactroban under silver alginate On the left plantar first second toe web space. Debridement is required here. This is also unchanged 11/17/17; left lateral leg wound continues to contract using Hydrofera Blue this is no longer the major issue. The major concern here is the right first second toe web space. He now has an open area going from dorsally to the plantar aspect. There is now wound on the inner lateral part of the first toe. Not a very viable surface on this. There is erythema spreading  medially into the forefoot. No major change in the left first second toe plantar wound 11/24/17; left lateral leg wound continues to contract using Hydrofera Blue. Nice improvement today The right first second toe web space all of this looks a lot less angry than last week. I have given him clindamycin and topical Bactroban for MRSA and terbinafine for the possibility of underlining tinea pedis that I could not control with ketoconazole. Looks somewhat better The area on the plantar left first second toe web space is weeping with dried debris around the wound 12/01/17; left lateral leg wound continues to contract he Hydrofera Blue. It is becoming thinner in terms of with nevertheless it is making good improvement. The right first second toe web space looks less angry but still a large necrotic-looking wounds starting on the plantar aspect of the right foot extending between the toes and now extensively on the base of the right second toe. I gave him clindamycin and topical Bactroban for MRSA anterior benefiting for the possibility of underlying tinea pedis. Not looking better today The area on the left first/second toe looks better. Debrided of necrotic debris 12/05/17* the patient was worked in urgently today because over the weekend he found blood on his incontinence bad when he woke up. He was found to have an ulcer by his wife who does most of his wound care. He came in today for [place] to look at this. He has not had a history of wounds in his buttocks in spite of his paraplegia. 12/08/17; seen in follow-up today at his usual appointment. He was seen earlier this week and found to have a new wound on his buttock. We also follow him for wounds on the left lateral leg, left first second toe web space and right first second toe web space 12/15/17; we have been using Hydrofera Blue to the left lateral leg which has improved. The right first second toe web space has also improved. Left first second toe web  space plantar aspect looks stable. The left buttock has worsened using Santyl. Apparently the buttock has drainage 12/22/17; we have been using Hydrofera Blue to the left lateral leg which continues to improve now 2 small wounds separated by normal skin. He tells [place] he had a fever up to 100 yesterday he is prone to UTIs but has not noted anything different. He does in and out catheterizations. The area between the first and second toes today does not look good necrotic surface covered with what looks  to be purulent drainage and erythema extending into the third toe. I had gotten this to something that I thought look better last time however it is not look good today. He also has a necrotic surface over the buttock wound which is expanded. I thought there might be infection under here so I removed a lot of the surface with a #5 curet though nothing look like it really needed culturing. He is been using Santyl to this area 12/27/17; his original wound on the left lateral leg continues to improve using Hydrofera Blue. I gave him samples of Baxdella although he was unable to take them out of fear for an allergic reaction ["lump in his throat"].the culture I did of the purulent drainage from his second toe last week showed both enterococcus and a set Enterobacter I was also concerned about the erythema on the bottom of his foot although paradoxically although this looks somewhat better today. 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 We've been using silver alginate here. They came up today that he sleeps in a recliner, I'm not sure why but I asked him to stop this 01/03/18; his original wound we've been using Hydrofera Blue is now separated into 2 areas. Ulcer on his left buttock is better he is off the recliner and sleeping in bed Finally both wound areas between his first and second toes also looks some  better 01/10/18; his original wound on the left lateral leg is now separated into 2 wounds we've been using Hydrofera Blue Ulcer on his left buttock has some drainage. There is a small probing site going into muscle layer superiorly.using silver alginate -He arrives today with a deep tissue injury on the left heel The wound on the dorsal aspect of his first second toe on the left looks a lot betterusing silver alginate ketoconazole The area on the first second toe web space on the right also looks a lot bette 01/17/18; his original wound on the left lateral leg continues to progress using Hydrofera Blue Ulcer on his left buttock also is smaller surface healthier except for a small probing site going into the muscle layer superiorly. 2.4 cm of tunneling in this area DTI on his left heel we have only been offloading. Looks better than last week no threatened open no evidence of infection the wound on the dorsal aspect of the first second toe on the left continues to look like it's regressing we have only been using silver alginate and terbinafine orally The area in the first second toe web space on the right also looks to be a lot better using silver alginate and terbinafine I think this was prompted by tinea pedis 01/31/18; the patient was hospitalized in [place] last week apparently for a complicated UTI. He was discharged on cefepime he does in and out catheterizations. In the hospital he was discovered M I don't mild elevation of AST and ALT and the terbinafine was stopped.predictably the pressure ulcer on s his buttock looks betterusing silver alginate. The area on the left lateral leg also is better using Hydrofera Blue. The area between the first and second toes on the left better. First and second toes on the right still substantial but better. Finally the DTI on the left heel has held together and looks like it's resolving 02/07/18-he is here in follow-up evaluation for multiple ulcerations.  He has new injury to the lateral aspect of  the last issue a pressure ulcer, he states this is from adhesive removal trauma. He states he has tried multiple adhesive products with no success. All other ulcers appear stable. The left heel DTI is resolving. We will continue with same treatment plan and follow-up next week. 02/14/18; follow-up for multiple areas. He has a new area last week on the lateral aspect of his pressure ulcer more over the posterior trochanter. The original pressure ulcer looks quite stable has healthy granulation. 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 Almost fully epithelialized on the original superior area using Hydrofera Blue DTI on the left heel has peeled off this week to reveal a small superficial wound under denuded skin and subcutaneous tissue Both areas between the first and second toes look better including nothing open on the left 02/21/18; The patient's wounds on his left ischial tuberosity and posterior left greater trochanter actually looked better. He has a large area of irritation around the area which I think is contact dermatitis. I am doubtful that this is fungal His original wound on the left lateral calf continues to improve we have been using Hydrofera Blue There is no open area in the left first second toe web space although there is a lot of thick callus The DTI on the left heel required debridement today of necrotic surface eschar and subcutaneous tissue using silver alginate Finally the area on the right first second toe webspace continues to contract using silver alginate and ketoconazole 02/28/18 Left ischial tuberosity wounds look better using silver alginate. Original wound on the left calf only has one small open area left using Hydrofera Blue DTI on the left heel required debridement mostly removing skin from around this wound surface. Using  silver alginate The areas on the right first/second toe web space using silver alginate and ketoconazole 03/08/18 on evaluation today patient appears to be doing decently well as best I can tell in regard to his wounds. This is the first time that I have seen him as he generally is followed by Dr. Dellia Nims. With that being said none of his wounds appear to be infected he does have an area where there is some skin covering what appears to be a new wound on the left dorsal surface of his great toe. This is right at the nail bed. With that being said I do believe that debrided away some of the excess skin can be of benefit in this regard. Otherwise he has been tolerating the dressing changes without complication. 03/14/18; patient arrives today with the multiplicity of wounds that we are following. He has not been systemically unwell Original wound on the left lateral calf now only has 2 small open areas we've been using Hydrofera Blue which should continue The deep tissue injury on the left heel requires debridement today. We've been using silver alginate The left first second toe and the right first second toe are both are reminiscence what I think was tinea pedis. Apparently some of the callus Surface between the toes was removed last week when it started draining. Purulent drainage coming from the wound on the ischial tuberosity on the left. 03/21/18-He is here in follow-up evaluation for multiple wounds. 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 He tolerated debridement. The only change to last week's recommendations is to discontinue antifungal cream between toes. He will follow-up next week  03/28/18; following up for multiple wounds;Concern this week is streaking redness and swelling in the right foot. He is going to need antibiotics for this. 03/31/18; follow-up for right foot cellulitis. Streaking redness and swelling in the  right foot on 03/28/18. 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 I put him on clindamycin 300 mg every 6 and brought him in for a quick check. He has an open wound between his first and second toes on the right foot as a potential source. 04/04/18; Right foot cellulitis is resolving he is completing clindamycin. This is truly good news Left lateral calf wound which is initial wound only has one small open area inferiorly this is close to healing out. He has compression stockings. We will use Hydrofera Blue right down to the epithelialization of this Nonviable surface on the left heel which was initially pressure with a DTI. We've been using Hydrofera Blue. I'm going to switch this back to silver alginate Left first second toe/tinea pedis this looks better using silver alginate Right first second toe tinea pedis using silver alginate Large pressure ulcers on theLeft ischial tuberosity. Small wound here Looks better. I am uncertain about the surface over the large wound. Using silver alginate 04/11/18; Cellulitis in the right foot is resolved Left lateral calf wound which was his original wounds still has 2 tiny open areas remaining this is just about closed Nonviable surface on the left heel is better but still requires debridement Left first second toe/tinea pedis still open using silver alginate Right first second toe wound tinea pedis I asked him to go back to using ketoconazole and silver alginate Large pressure ulcers on the left ischial tuberosity this shear injury here is resolved. Wound is smaller. No evidence of infection using silver alginate 04/18/18; Patient arrives with an intense area of cellulitis in the right mid lower calf extending into the right heel area. Bright red and warm. Smaller area on the left anterior leg. He has a significant history of MRSA. He will definitely need antibioticsdoxycycline He now has 2 open areas on the left ischial tuberosity the  original large wound and now a satellite area which I think was above his initial satellite areas. Not a wonderful surface on this satellite area surrounding erythema which looks like pressure related. His left lateral calf wound again his original wound is just about closed Left heel pressure injury still requiring debridement Left first second toe looks a lot better using silver alginate Right first second toe also using silver alginate and ketoconazole cream also looks better 04/20/18; the patient was worked in early today out of concerns with his cellulitis on the right leg. I had started him on doxycycline. This was 2 days ago. His wife was concerned about the swelling in the area. Also concerned about the left buttock. He has not been systemically unwell no fever chills. No nausea vomiting or diarrhea 04/25/18; the patient's left buttock wound is continued to deteriorate he is using Hydrofera Blue. He is still completing clindamycin for the cellulitis on the right leg although all of this looks better. 05/02/18 Left buttock wound still with a lot of drainage and a very tightly adherent fibrinous necrotic surface. He has a deeper area superiorly The left lateral calf wound is still closed DTI wound on the left heel necrotic surface especially the circumference using Iodoflex Areas between his left first second toe and right first second toe both look better. Dorsally and the right first second toe he  had a necrotic surface although at smaller. In using silver alginate and ketoconazole. I did a culture last week which was a deep tissue culture of the reminiscence of the open wound on the right first second toe dorsally. This grew a few Acinetobacter and a few methicillin-resistant staph aureus. Nevertheless the area actually this week looked better. I didn't feel the need to specifically address this at least in terms of systemic antibiotics. 05/09/18; wounds are measuring larger more drainage per  our intake. We are using Santyl covered with alginate on the large superficial buttock wounds, Iodosorb on the left heel, ketoconazole and silver alginate to the dorsal first and second toes bilaterally. 05/16/18; The area on his left buttock better in some aspects although the area superiorly over the ischial tuberosity required an extensive debridement.using Santyl Left heel appears stable. Using Iodoflex The areas between his first and second toes are not bad however there is spreading erythema up the dorsal aspect of his left foot this looks like cellulitis again. He is insensate the erythema is really very brilliant.o Erysipelas He went to see an allergist days ago because he was itching part of this he had lab work done. This showed a white count of 15.1 with 70% neutrophils. Hemoglobin of 11.4 and a platelet count of 659,000. Last white count we had in Epic was a 2-1/2 years ago which was 25.9 but he was ill at the time. He was able to show me some lab work that was done by his primary physician the pattern is about the same. 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 But prompted me to go ahead and do x-rays of both feet and the pelvis rule out osteomyelitis. He also had a comprehensive metabolic panel this was reasonably normal his albumin was 3.7 liver function tests BUN/creatinine all normal 05/23/18; x-rays of both his feet from last week were negative for underlying pulmonary abnormality. The x-ray of his pelvis however showed mild irregularity in the left ischial which may represent some early osteomyelitis. The wound in the left ischial continues to get deeper clearly now exposed muscle. Each week necrotic surface material over this area. Whereas the rest of the wounds do not look so bad. The left ischial wound we have been using Santyl and calcium alginate T the left heel surface necrotic debris using Iodoflex o The left lateral leg is still  healed Areas on the left dorsal foot and the right dorsal foot are about the same. There is some inflammation on the left which might represent contact dermatitis, fungal dermatitis I am doubtful cellulitis although this looks better than last week 05/30/18; CT scan done at Hospital did not show any osteomyelitis or abscess. Suggested the possibility of underlying cellulitis although I don't see a lot of evidence of this at the bedside The wound itself on the left buttock/upper thigh actually looks somewhat better. No debridement Left heel also looks better no debridement continue Iodoflex Both dorsal first second toe spaces appear better using Lotrisone. Left still required debridement 06/06/18; Intake reported some purulent looking drainage from the left gluteal wound. Using Santyl and calcium alginate Left heel looks better although still a nonviable surface requiring debridement The left dorsal foot first/second webspace actually expanding and somewhat deeper. I may consider doing a shave biopsy of this area Right dorsal foot first/second webspace appears stable to improved. Using Lotrisone and silver alginate to both these areas 06/13/18 Left gluteal surface looks  better. Now separated in the 2 wounds. No debridement required. Still drainage. We'll continue silver alginate Left heel continues to look better with Iodoflex continue this for at least another week Of his dorsal foot wounds the area on the left still has some depth although it looks better than last week. We've been using Lotrisone and silver alginate 06/20/18 Left gluteal continues to look better healthy tissue Left heel continues to look better healthy granulation wound is smaller. He is using Iodoflex and his long as this continues continue the Iodoflex Dorsal right foot looks better unfortunately dorsal left foot does not. There is swelling and erythema of his forefoot. He had minor trauma to this several days ago but doesn't  think this was enough to have caused any tissue injury. Foot looks like cellulitis, we have had this problem before 06/27/18 on evaluation today patient appears to be doing a little worse in regard to his foot ulcer. 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 Both of which would really be his only options for treating this infection. In the past he has been given and effusion of Orbactiv. This is done very well for him in the past again it's one time dosing IV antibiotic therapy. Subsequently I do believe this is something we're gonna need to see about doing at this point in time. Currently his other wounds seem to be doing somewhat better in my pinion I'm pretty happy in that regard. 07/03/18 on evaluation today patient's wounds actually appear to be doing fairly well. He has been tolerating the dressing changes without complication. All in all he seems to be showing signs of improvement. In regard to the antibiotics he has been dealing with infectious disease since I saw him last week as far as getting this scheduled. In the end [facility]'s [facility] this coming Friday. In the meantime he has been continuing to perform the dressing changes in such as previous. There does not appear to be any evidence of infection worsengin at this time. 07/10/18; Since I last saw this man 2 weeks ago things have actually improved. IV antibiotics of resulted in less forefoot erythema although there is still some present. He is not systemically unwell Left buttock wounds 2 now have no depth there is increased epithelialization Using silver alginate Left heel still requires debridement using Iodoflex Left dorsal foot still with a sizable wound about the size of a border but healthy granulation Right dorsal foot still with a  slitlike area using silver alginate 07/18/18; the patient's cellulitis in the left foot is improved in fact I think it is on its way to resolving. Left buttock wounds 2 both look better although the larger one has hypertension granulation we've been using silver alginate Left heel has some thick circumferential redundant skin over the wound edge which will need to be removed today we've been using Iodoflex Left dorsal foot is still a sizable wound required debridement using silver alginate The right dorsal foot is just about closed only a small open area remains here 07/25/18; left foot cellulitis is resolved Left buttock wounds 2 both look better. Hyper-granulation on the major area Left heel as some debris over the surface but otherwise looks a healthier wound. Using silver collagen Right dorsal foot is just about closed 07/31/18; arrives with  our intake nurse worried about purulent drainage from the buttock. We had hyper-granulation here last week His buttock wounds 2 continue to look better Left heel some debris over the surface but measuring smaller. Right dorsal foot unfortunately has openings between the toes Left foot superficial wound looks less aggravated. 08/07/18 Buttock wounds continue to look better although some of her granulation and the larger medial wound. silver alginate Left heel continues to look a lot better.silver collagen Left foot superficial wound looks less stable. Requires debridement. He has a new wound superficial area on the foot on the lateral dorsal foot. Right foot looks better using silver alginate without Lotrisone 08/14/2018; patient was in the ER last week diagnosed with a UTI. He is now on Cefpodoxime and Macrodantin. Buttock wounds continued to be smaller. Using silver alginate Left heel continues to look better using silver collagen Left foot superficial wound looks as though it is improving Right dorsal foot area is just about healed. 08/21/2018; patient is  completed his antibiotics for his UTI. He has 2 open areas on the buttocks. There is still not closed although the surface looks satisfactory. Using silver alginate Left heel continues to improve using silver collagen The bilateral dorsal foot areas which are at the base of his first and second toes/possible tinea pedis are actually stable on the left but worse on the right. The area on the left required debridement of necrotic surface. After debridement I obtained a specimen for PCR culture. The right dorsal foot which is been just about healed last week is now reopened 08/28/2018; culture done on the left dorsal foot showed coag negative staph both staph epidermidis and Lugdunensis. I think this is worthwhile initiating systemic treatment. I will use doxycycline given his long list of allergies. The area on the left heel slightly improved but still requiring debridement. The large wound on the buttock is just about closed whereas the smaller one is larger. Using silver alginate in this area 09/04/2018; patient is completing his doxycycline for the left foot although this continues to be a very difficult wound area with very adherent necrotic debris. We are using silver alginate to all his wounds right foot left foot and the small wounds on his buttock, silver collagen on the left heel. 09/11/2018; once again this patient has intense erythema and swelling of the left forefoot. Lesser degrees of erythema in the right foot. He has a long list of allergies and intolerances. I will reinstitute doxycycline. 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 Using silver alginate Left heel also looks better using silver collagen Unfortunately both the areas on his feet look worse. The area on the left first second webspace is now gone through to the plantar part of his foot. The area on the left foot anteriorly is irritated with erythema and swelling in the  forefoot. 09/25/2018 His wound on the left plantar heel looks better. Using silver collagen The area on the left buttock 2 small remnant areas. One is closed one is still open. Using silver alginate The areas between both his first and second toes look worse. This in spite of long-standing antifungal therapy with ketoconazole and silver alginate which should have antifungal activity He has small areas around his original wound on the left calf one is on the bottom of the original scar tissue and one superiorly both of these are small and superficial but again given wound history in this site this is  worrisome 10/02/2018 Left plantar heel continues to gradually contract using silver collagen Left buttock wound is unchanged using silver alginate The areas on his dorsal feet between his first and second toes bilaterally look about the same. I prescribed clindamycin ointment to see if we can address chronic staph colonization and also the underlying possibility of erythrasma The left lateral lower extremity wound is actually on the lateral part of his ankle. Small open area here. We have been using silver alginate 10/09/2018; Left plantar heel continues to look healthy and contract. No debridement is required Left buttock slightly smaller with a tape injury wound just below which was new this week Dorsal feet somewhat improved I have been using clindamycin Left lateral looks lower extremity the actual open area looks worse although a lot of this is epithelialized. I am going to change to silver collagen today He has a lot more swelling in the right leg although this is not pitting not red and not particularly warm there is a lot of spasm in the right leg usually indicative of people with paralysis of some underlying discomfort. We have reviewed his vascular status from 2017 he had a left greater saphenous vein ablation. I wonder about referring him back to vascular surgery if the area on the left  leg continues to deteriorate. 10/16/2018 in today for follow-up and management of multiple lower extremity ulcers. His left Buttock wound is much lower smaller and almost closed completely. The wound to the left ankle has began to reopen with Epithelialization and some adherent slough. He has multiple new areas to the left foot and leg. The left dorsal foot without much improvement. Wound present between left great webspace and 2nd toe. Erythema and edema present right leg. Right LE ultrasound obtained on 10/10/18 was negative for DVT . 10/23/2018; Left buttock is closed over. Still dry macerated skin but there is no open wound. I suspect this is chronic pressure/moisture Left lateral calf is quite a bit worse than when I saw this last. There is clearly drainage here he has macerated skin into the left plantar heel. We will change the primary dressing to alginate Left dorsal foot has some improvement in overall wound area. Still using clindamycin and silver alginate Right dorsal foot about the same as the left using clindamycin and silver alginate The erythema in the right leg has resolved. He is DVT rule out was negative Left heel pressure area required debridement although the wound is smaller and the surface is health 10/26/2018 The patient came back in for his nurse check today predominantly because of the drainage coming out of the left lateral leg with a recent reopening of his original wound on the left lateral calf. He comes in today with a large amount of surrounding erythema around the wound extending from the calf into the ankle and even in the area on the dorsal foot. He is not systemically unwell. He is not febrile. Nevertheless this looks like cellulitis. We have been using silver alginate to the area. I changed him to a regular visit and I am going to prescribe him doxycycline. The rationale here is a long list of medication intolerances and a history of MRSA. I did not see anything  that I thought would provide a valuable culture 10/30/2018 Follow-up from his appointment 4 days ago with really an extensive area of cellulitis in the left calf left lateral ankle and left dorsal foot. I put him on doxycycline. He has a long list of medication allergies  which are true allergy reactions. 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 In any case he is a lot better today. The erythema especially in the anterior and lateral left calf is better. He still has left ankle erythema. He also is complaining about increasing edema in the right leg we have only been using Kerlix Coban and he has been doing the wraps at home. Finally he has a spotty rash on the medial part of his upper left calf which looks like folliculitis or perhaps wrap occlusion type injury. Small superficial macules not pustules 11/06/18 patient arrives today with again a considerable degree of erythema around the wound on the left lateral calf extending into the dorsal ankle and dorsal foot. This is a lot worse than when I saw this last week. He is on doxycycline really with not a lot of improvement. He has not been systemically unwell Wounds on the; left heel actually looks improved. Original area on the left foot and proximity to the first and second toes looks about the same. He has superficial areas on the dorsal foot, anterior calf and then the reopening of his original wound on the left lateral calf which looks about the same The only area he has on the right is the dorsal webspace first and second which is smaller. He has a large area of dry erythematous skin on the left buttock small open area here. 11/13/2018; the patient arrives in much better condition. The erythema around the wound on the left lateral calf is a lot better. 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 In any case this is a lot better. The area on the left heel is very small and just about resolved using silver collagen we have been using silver alginate to the areas on his dorsal feet 11/20/2018; his wounds include the left lateral calf, left heel, dorsal aspects of both feet just proximal to the first second webspace. He is stable to slightly improved. I did not think any changes to his dressings were going to be necessary 11/27/2018 he has a reopening on the left buttock which is surrounded by what looks like tinea or perhaps some other form of dermatitis. The area on the left dorsal foot has some erythema around it I have marked this area but I am not sure whether this is cellulitis or not. Left heel is not closed. Left calf the reopening is really slightly longer and probably worse 1/13; in general things look better and smaller except for the left dorsal foot. Area on the left heel is just about closed, left buttock looks better only a small wound remains in the skin looks better [using Lotrisone] 1/20; the area on the left heel only has a few remaining open areas here. Left lateral calf about the same in terms of size, left dorsal foot slightly larger right lateral foot still not closed. The area on the left buttock has no open wound and the surrounding skin looks a lot better 1/27; the area on the left heel is closed. Left lateral calf better but still requiring extensive debridements. The area on his left buttock is closed. He still has the open areas on the left dorsal foot which is slightly smaller in the right foot which is slightly expanded. We have been using Iodoflex on these areas as well 2/3; left heel is  closed. Left lateral calf still requiring debridement using Iodoflex there is no open area on his left buttock however he has dry scaly skin over a large area of this. Not really responding well to the Lotrisone. Finally the areas on his dorsal feet at the level of the  first second webspace are slightly smaller on the right and about the same on the left. Both of these vigorously debrided with Anasept and gauze 2/10; left heel remains closed he has dry erythematous skin over the left buttock but there is no open wound here. Left lateral leg has come in and with. Still requiring debridement we have been using Iodoflex here. Finally the area on the left dorsal foot and right dorsal foot are really about the same extremely dry callused fissured areas. He does not yet have a dermatology appointment 2/17; left heel remains closed. He has a new open area on the left buttock. The area on the left lateral calf is bigger longer and still covered in necrotic debris. No major change in his foot areas bilaterally. I am awaiting for a dermatologist to look on this. We have been using ketoconazole I do not know that this is been doing any good at all. 2/24; left heel remains closed. The left buttock wound that was new reopening last week looks better. The left lateral calf appears better also although still requires debridement. The major area on his foot is the left first second also requiring debridement. We have been putting Prisma on all wounds. I do not believe that the ketoconazole has done too much good for his feet. He will use Lotrisone I am going to give him a 2-week course of terbinafine. We still do not have a dermatology appointment 3/2 left heel remains closed however there is skin over bone in this area I pointed this out to him today. The left buttock wound is epithelialized but still does not look completely stable. The area on the left leg required debridement were using silver collagen here. With regards to his feet we changed to Lotrisone last week and silver alginate. 3/9; left heel remains closed. Left buttock remains closed. The area on the right foot is essentially closed. The left foot remains unchanged. Slightly smaller on the left lateral calf. Using  silver collagen to both of these areas 3/16-Left heel remains closed. Area on right foot is closed. Left lateral calf above the lateral malleolus open wound requiring debridement with easy bleeding. Left dorsal wound proximal to first toe also debrided. Left ischial area open new. Patient has been using Prisma with wrapping every 3 days. Dermatology appointment is apparently tomorrow.Patient has completed his terbinafine 2-week course with some apparent improvement according to him, there is still flaking and dry skin in his foot on the left 3/23; area on the right foot is reopened. The area on the left anterior foot is about the same still a very necrotic adherent surface. He still has the area on the left leg and reopening is on the left buttock. He apparently saw dermatology although I do not have a note. According to the patient who is usually fairly well informed they did not have any good ideas. Put him on oral terbinafine which she is been on before. 3/30; using silver collagen to all wounds. Apparently his dermatologist put him on doxycycline and rifampin presumably some culture grew staph. I do not have this result. He remains on terbinafine although I have used terbinafine on him before 4/6; patient has had  a fairly substantial reopening on the right foot between the first and second toes. He is finished his terbinafine and I believe is on doxycycline and rifampin still as prescribed by dermatology. We have been using silver collagen to all his wounds although the patient reports that he thinks silver alginate does better on the wounds on his buttock. 4/13; the area on his left lateral calf about the same size but it did not require debridement. Left dorsal foot just proximal to the webspace between the first and second toes is about the same. Still nonviable surface. I note some superficial bronze discoloration of the dorsal part of his foot Right dorsal foot just proximal to the first  and second toes also looks about the same. I still think there may be the same discoloration I noted above on the left Left buttock wound looks about the same 4/20; left lateral calf appears to be gradually contracting using silver collagen. 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 The left dorsal foot wound is debrided of tightly adherent necrotic debris and really cleans up quite nicely. The right area is worse with expansion. I did not debride this it is now over the base of the second toe The area on his left buttock is smaller no debridement is required using silver collagen 5/4; left calf continues to make good progress. He arrives with erythema around the wounds on his dorsal foot which even extends to the plantar aspect. Very concerning for coexistent infection. He is finished the erythromycin I gave him for possible erythrasma this does not seem to have helped. The area on the left foot is about the same base of the dorsal toes Is area on the buttock looks improved on the left 5/11; left calf and left buttock continued to make good progress. Left foot is about the same to slightly improved. Major problem is on the right foot. He has not had an x-ray. Deep tissue culture I did last week showed both Enterobacter and E. coli. I did not change the doxycycline I put him on empirically although neither 1 of these were plated to doxycycline. He arrives today with the erythema looking worse on both the dorsal and plantar foot. Macerated skin on the bottom of the foot. he has not been systemically unwell 5/18-Patient returns at 1 week, left calf wound appears to be making some progress, left buttock wound appears slightly worse than last time, left foot wound looks slightly better, right foot redness is marginally better. X-ray of both feet show no air or evidence of osteomyelitis. Patient is finished his Omnicef and terbinafine. He  continues to have macerated skin on the bottom of the left foot as well as right 5/26; left calf wound is better, left buttock wound appears to have multiple small superficial open areas with surrounding macerated skin. X-rays that I did last time showed no evidence of osteomyelitis in either foot. He is finished cefdinir and doxycycline. I do not think that he was on terbinafine. He continues to have a large superficial open area on the right foot anterior dorsal and slightly between the first and second toes. I did send him to dermatology 2 months ago or so wondering about whether they would do a fungal scraping. I do not believe they did but did do a culture. We have been using silver alginate to the toe areas, he has been using antifungals at home topically either ketoconazole or Lotrisone. We are using silver  collagen on the left foot, silver alginate on the right, silver collagen on the left lateral leg and silver alginate on the left buttock 6/1; left buttock area is healed. We have the left dorsal foot, left lateral leg and right dorsal foot. We are using silver alginate to the areas on both feet and silver collagen to the area on his left lateral calf 6/8; the left buttock apparently reopened late last week. He is not really sure how this happened. He is tolerating the terbinafine. Using silver alginate to all wounds 6/15; left buttock wound is larger than last week but still superficial. Came in the clinic today with a report of purulence from the left lateral leg I did not identify any infection Both areas on his dorsal feet appear to be better. He is tolerating the terbinafine. Using silver alginate to all wounds 6/22; left buttock is about the same this week, left calf quite a bit better. His left foot is about the same however he comes in with erythema and warmth in the right forefoot once again. Culture that I gave him in the beginning of May showed Enterobacter and E. coli. I gave him  doxycycline and things seem to improve although neither 1 of these organisms was specifically plated. 6/29; left buttock is larger and dry this week. Left lateral calf looks to me to be improved. Left dorsal foot also somewhat improved right foot completely unchanged. The erythema on the right foot is still present. He is completing the Ceftin dinner that I gave him empirically [see discussion above.) 7/6 - All wounds look to be stable and perhaps improved, the left buttock wound is slightly smaller, per patient bleeds easily, completed ceftin, the right foot redness is less, he is on terbinafine 7/13; left buttock wound about the same perhaps slightly narrower. Area on the left lateral leg continues to narrow. Left dorsal foot slightly smaller right foot about the same. We are using silver alginate on the right foot and Hydrofera Blue to the areas on the left. Unna boot on the left 2 layer compression on the right 7/20; left buttock wound absolutely the same. Area on lateral leg continues to get better. Left dorsal foot require debridement as did the right no major change in the 7/27; left buttock wound the same size necrotic debris over the surface. The area on the lateral leg is closed once again. His left foot looks better right foot about the same although there is some involvement now of the posterior first second toe area. He is still on terbinafine which I have given him for a month, not certain a centimeter major change 06/25/19-All wounds appear to be slightly improved according to report, left buttock wound looks clean, both foot wounds have minimal to no debris the right dorsal foot has minimal slough. We are using Hydrofera Blue to the left and silver alginate to the right foot and ischial wound. 8/10-Wounds all appear to be around the same, the right forefoot distal part has some redness which was not there before, however the wound looks clean and small. Ischial wound looks about the  same with no changes 8/17; his wound on the left lateral calf which was his original chronic venous insufficiency wound remains closed. Since I last saw him the areas on the left dorsal foot right dorsal foot generally appear better but require debridement. The area on his left initial tuberosity appears somewhat larger to me perhaps hyper granulated and bleeds very easily. We have been  using Hydrofera Blue to the left dorsal foot and silver alginate to everything else 8/24; left lateral calf remains closed. The areas on his dorsal feet on the webspace of the first and second toes bilaterally both look better. The area on the left buttock which is the pressure ulcer stage II slightly smaller. I change the dressing to Hydrofera Blue to all areas 8/31; left lateral calf remains closed. The area on his dorsal feet bilaterally look better. Using Hydrofera Blue. Still requiring debridement on the left foot. No change in the left buttock pressure ulcers however 9/14; left lateral calf remains closed. Dorsal feet look quite a bit better than 2 weeks ago. Flaking dry skin also a lot better with the ammonium lactate I gave him 2 weeks ago. The area on the left buttock is improved. He states that his Roho cushion developed a leak and he is getting a new one, in the interim he is offloading this vigorously 9/21; left calf remains closed. Left heel which was a possible DTI looks better this week. He had macerated tissue around the left dorsal foot right foot looks satisfactory and improved left buttock wound. I changed his dressings to his feet to silver alginate bilaterally. Continuing Hydrofera Blue on the left buttock. 9/28 left calf remains closed. Left heel did not develop anything [possible DTI] dry flaking skin on the left dorsal foot. Right foot looks satisfactory. Improved left buttock wound. We are using silver alginate on his feet Hydrofera Blue on the buttock. I have asked him to go back to the  Lotrisone on his feet including the wounds and surrounding areas 10/5; left calf remains closed. The areas on the left and right feet about the same. A lot of this is epithelialized however debris over the remaining open areas. He is using Lotrisone and silver alginate. The area on the left buttock using Hydrofera Blue 10/26. Patient has been out for 3 weeks secondary to Covid concerns. He tested negative but I think his wife tested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 the anterior leg and Achilles area are both healed. He continues to have a deep wound with very adherent necrotic eschar and slough on the lateral aspect of his left leg in 2 areas and also raised area over the left Achilles. We put Santyl on this last week and left him in a rapid. He says the drainage went through. He has some Kerlix Coban and in some Profore at home I have therefore written him a prescription for Santyl and he can change this at home on his own. 01/23/16; the original 2 wounds on the right leg are apparently still closed. He continues to have a  deep wound on his left lateral leg in 2 spots the superior one much larger than the inferior one. He also has a raised area on the left Achilles. We have been putting Santyl and all of these wounds. His wife is changing this at home one time this week although she may be able to do this more frequently. 01/30/16 no open wounds on the right leg. He continues to have a deep wound on the left lateral leg in 2 spots and a smaller wound over the left Achilles area. [name] This debridement is with great difficulty. [name] 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 In spite of that his leg actually looks somewhat better. There is still copious drainage and some erythema but it is generally better. The oral options that were obvious including Zyvox and sulfonamides he has rash issues both of these. This is sensitive to rifampin but this is not usually used along gentamicin but this is parenteral and again not used along. The obvious alternative is vancomycin. He has had his arterial studies. [name] T brachial index was 1.3 oe on the right. His waveforms were biphasic bilaterally. Doppler waveforms of the digit were normal in the right damp and on the left. Comment that this could've been due to extreme edema. His venous studies show reflux on both sides in the femoral popliteal veins as well as the greater and lesser saphenous veins bilaterally. Ultimately he is going to need to see vascular surgery about this issue. Hopefully when we can get his wounds and a little better shape. 02/19/16; the patient was able to complete a course of [facility]'s for MRSA in the face of  multiple antibiotic allergies. Arterial studies showed an ABI of him 0.88 on the right 1.17 on the left the. Waveforms were  biphasic at the posterior tibial and dorsalis pedis digital waveforms were normal. Right toe brachial index was 1.3 limited by shaking and edema. His venous study showed widespread reflux in the left at the common femoral vein the greater and lesser saphenous vein the greater and lesser saphenous vein on the right as well as the popliteal and femoral vein. The popliteal and femoral vein on the left did not show reflux. His wounds on the right leg give healed on the left he is still using Santyl. 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 The erythema has not really resolved and I wonder if this is mostly venous inflammation rather than cellulitis. Still using Santyl. He is approved for Apligraf 03/04/16; there is less erythema around the wound. Both wounds require aggressive surgical debridement. Not yet ready for Apligraf 03/11/16; aggressive debridement again. Not ready for Apligraf 03/18/16 aggressive debridement again. 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 Has been to see vascular surgery he is being planned for a venous ablation 03/25/16; aggressive debridement again of both wound areas on the left lateral leg. He is due for ablation surgery on May 22. He is much closer to being ready for an Apligraf. Has a new area between the left first and second toes 04/01/16 aggressive debridement done of both wounds. The new wound at the base of between his second and first toes looks stable 04/08/16; continued aggressive debridement of both wounds on the left lower leg. He goes for his venous ablation on Monday. The new wound at the base of his first and second toes dorsally appears stable. 04/15/16; wounds aggressively debridement although the base of this looks considerably better Apligraf #1. He had ablation surgery on  Monday I'll need to research these records. We only have approval for four Apligraf's 04/22/16; the patient is here for a wound check [Apligraf last week] intake nurse concerned about erythema around the wounds. Apparently a significant degree of drainage. The patient has chronic venous inflammation which I think accounts for most of this however I was asked to look at this today 04/26/16; the patient came back for check of possible cellulitis in his left foot however the Apligraf dressing was inadvertently removed therefore we elected to prep the wound for a second Apligraf. I put him on doxycycline on 6/1 the erythema in the foot 05/03/16 we did not remove the dressing from the superior wound as this is where I put all of his last Apligraf. Surface debridement done with a curette of the lower wound which looks very healthy. The area on the left foot also looks quite satisfactory at the dorsal artery at the first and second toes 05/10/16; continue Apligraf to this. Her wound, Hydrafera to the lower wound. He has a new area on the right second toe. Left dorsal foot firstoosecond toe also looks improved 05/24/16; wound dimensions must be smaller I was able to use Apligraf to all 3 remaining wound areas. 06/07/16 patient's last Apligraf was 2 weeks ago. He arrives today with the 2 wounds on his lateral left leg joined together. This would have to be seen as a negative. He also has a small wound in his first and second toe on the left dorsally with quite a bit of surrounding erythema in the first second and third toes. This looks to be infected or inflamed, very difficult clinical call. 06/21/16: lateral left leg combined wounds. Adherent surface slough area on the left dorsal foot at  roughly the fourth toe looks improved 07/12/16; he now has a single linear wound on the lateral left leg. This does not look to be a lot changed from when I lost saw this. The area on his dorsal left foot looks considerably better  however. 08/02/16; no major change in the substantial area on his left lateral leg since last time. We have been using Hydrofera Blue for a prolonged period of time now. The area on his left foot is also unchanged from last review 07/19/16; the area on his dorsal foot on the left looks considerably smaller. He is beginning to have significant rims of epithelialization on the lateral left leg wound. This also looks better. 08/05/16; the patient came in for a nurse visit today. Apparently the area on his left lateral leg looks better and it was wrapped. However in general discussion the patient noted a new area on the dorsal aspect of his right second toe. The exact etiology of this is unclear but likely relates to pressure. 08/09/16 really the area on the left lateral leg did not really look that healthy today perhaps slightly larger and measurements. The area on his dorsal right second toe is improved also the left foot wound looks stable to improved 08/16/16; the area on the last lateral leg did not change any of dimensions. Post debridement with a curet the area looked better. Left foot wound improved and the area on the dorsal right second toe is improved 08/23/16; the area on the left lateral leg may be slightly smaller both in terms of length and width. Aggressive debridement with a curette afterwards the tissue appears healthier. Left foot wound appears improved in the area on the dorsal right second toe is improved 08/30/16 patient developed a fever over the weekend and was seen in an urgent care. Felt to have a UTI and put on doxycycline. He has been since changed over the phone to [facility]. After we took off the wrap on his right leg today the leg is swollen warm and erythematous, probably more likely the source of the fever 09/06/16; have been using collagen to the major left leg wound, silver alginate to the area on his anterior foot/toes 09/13/16; the areas on his anterior foot/toes on both  sides appear to be virtually closed. Extensive wound on the left lateral leg perhaps slightly narrower but each visit still covered an adherent surface slough 09/16/16 patient was in for his usual Thursday nurse visit however the intake nurse noted significant erythema of his dorsal right foot. He is also running a low- grade fever and having increasing spasms in the right leg 09/20/16 here for cellulitis involving his right great toes and forefoot. This is a lot better. Still requiring debridement on his left lateral leg. Santyl direct says he needs prior authorization. Therefore his wife cannot change this at home 09/30/16; the patient's extensive area on the left lateral calf and ankle perhaps somewhat better. Using Santyl. The area on the left toes is healed and I think the area on his right dorsal foot is healed as well. There is no cellulitis or venous inflammation involving the right leg. He is going to need compression stockings here. 10/07/16; the patient's extensive wound on the left lateral calf and ankle does not measure any differently however there appears to be less adherent surface slough using Santyl and aggressive weekly debridements 10/21/16; no major change in the area on the left lateral calf. Still the same measurement still very difficult to debridement  adherent slough and nonviable subcutaneous tissue. This is not really been helped by several weeks of Santyl. Previously for 2 weeks I used Iodoflex for a short period. A prolonged course of Hydrofera Blue didn't really help. I'm not sure why I only used 2 weeks of Iodoflex on this there is no evidence of surrounding infection. He has a small area on the right second toe which looks as though it's progressing towards closure 10/28/16; the wounds on his toes appear to be closed. No major change in the left lateral leg wound although the surface looks somewhat better using Iodoflex. He has had previous arterial studies that were  normal. He has had reflux studies and is status post ablation although I don't have any exact notes on which vein was ablated. I'll need to check the surgical record 11/04/16; he's had a reopening between the first and second toe on the left and right. No major change in the left lateral leg wound. There is what appears to be cellulitis of the left dorsal foot 11/18/16 the patient was hospitalized initially in [place] and then subsequently transferred to [facility] long and was admitted there from 11/09/16 through 11/12/16. He had developed progressive cellulitis on the right leg in spite of the doxycycline I gave him. I'd spoken to the hospitalist in [place] who was concerned about continuing leukocytosis. CT scan is what I suggested this was done which showed soft tissue swelling without evidence of osteomyelitis or an underlying abscess blood cultures were negative. At [facility] he was treated with vancomycin and Primaxin and then add an infectious disease consult. He was transitioned to Ceftaroline. He has been making progressive improvement. Overall a severe cellulitis of the right leg. He is been using silver alginate to her original wound on the left leg. The wounds in his toes on the right are closed there is a small open area on the base of the left second toe 11/26/15; the patient's right leg is much better although there is still some edema here this could be reminiscent from his severe cellulitis likely on top of some degree of lymphedema. His left anterior leg wound has less surface slough as reported by her intake nurse. Small wound at the base of the left second toe 12/02/16; patient's right leg is better and there is no open wound here. His left anterior lateral leg wound continues to have a healthy-looking surface. Small wound at the base of the left second toe however there is erythema in the left forefoot which is worrisome 12/16/16; is no open wounds on his right leg. We took measurements  for stockings. His left anterior lateral leg wound continues to have a healthy-looking surface. I'm not sure where we were with the Apligraf run through his insurance. We have been using Iodoflex. He has a thick eschar on the left first second toe interface, I suspect this may be fungal however there is no visible open 12/23/16; no open wound on his right leg. He has 2 small areas left of the linear wound that was remaining last week. We have been using Prisma, I thought I have disclosed this week, we can only look forward to next week 01/03/17; the patient had concerning areas of erythema last week, already on doxycycline for UTI through his primary doctor. The erythema is absolutely no better there is warmth and swelling both medially from the left lateral leg wound and also the dorsal left foot. 01/06/17- Patient is here for follow-up evaluation of his left lateral  leg ulcer and bilateral feet ulcers. He is on oral antibiotic therapy, tolerating that. Nursing staff and the patient states that the erythema is improved from Monday. 01/13/17; the predominant left lateral leg wound continues to be problematic. I had put Apligraf on him earlier this month once. However he subsequently developed what appeared to be an intense cellulitis around the left lateral leg wound. I gave him Dalvance I think on 2/12 perhaps 2/13 he continues on cefdinir. The erythema is still present but the warmth and swelling is improved. I am hopeful that the cellulitis part of this control. I wouldn't be surprised if there is an element of venous inflammation as well. 01/17/17. The erythema is present but better in the left leg. His left lateral leg wound still does not have a viable surface buttons certain parts of this long thin wound it appears like there has been improvement in dimensions. 01/20/17; the erythema still present but much better in the left leg. I'm thinking this is his usual degree of chronic venous inflammation. The  wound on the left leg looks somewhat better. Is less surface slough 01/27/17; erythema is back to the chronic venous inflammation. The wound on the left leg is somewhat better. I am back to the point where I like to try an Apligraf once again 02/10/17; slight improvement in wound dimensions. Apligraf #2. He is completing his doxycycline 02/14/17; patient arrives today having completed doxycycline last Thursday. This was supposed to be a nurse visit however once again he hasn't tense erythema from the medial part of his wound extending over the lower leg. Also erythema in his foot this is roughly in the same distribution as last time. He has baseline chronic venous inflammation however this is a lot worse than the baseline I have learned to accept the on him is baseline inflammation 02/24/17- patient is here for follow-up evaluation. He is tolerating compression therapy. His voicing no complaints or concerns he is here anticipating an Apligraf 03/03/17; he arrives today with an adherent necrotic surface. I don't think this is surface is going to be amenable for Apligraf's. The erythema around his wound and on the left dorsal foot has resolved he is off antibiotics 03/10/17; better-looking surface today. I don't think he can tolerate Apligraf's. He tells me he had a wound VAC after a skin graft years ago to this area and they had difficulty with a seal. The erythema continues to be stable around this some degree of chronic venous inflammation but he also has recurrent cellulitis. We have been using Iodoflex 03/17/17; continued improvement in the surface and may be small changes in dimensions. Using Iodoflex which seems the only thing that will control his surface 03/24/17- He is here for follow up evaluation of his LLE lateral ulceration and ulcer to right dorsal foot/toe space. He is voicing no complaints or concerns, He is tolerating compression wrap. 03/31/17 arrives today with a much healthier looking wound  on the left lower extremity. We have been using Iodoflex for a prolonged period of time which has for the first time prepared and adequate looking wound bed although we have not had much in the way of wound dimension improvement. He also has a small wound between the first and second toe on the right 04/07/17; arrives today with a healthy-looking wound bed and at least the top 50% of this wound appears to be now her. No debridement was required I have changed him to [facility] last week after prolonged Iodoflex. He  did not do well with Apligraf's. We've had a re-opening between the first and second toe on the right 04/14/17; arrives today with a healthier looking wound bed contractions and the top 50% of this wound and some on the lesser 50%. Wound bed appears healthy. The area between the first and second toe on the right still remains problematic 04/21/17; continued very gradual improvement. Using [facility] 04/28/17; continued very gradual improvement in the left lateral leg venous insufficiency wound. His periwound erythema is very mild. We have been using Hydrofera Blue. Wound is making progress especially in the superior 50% 05/05/17; he continues to have very gradual improvement in the left lateral venous insufficiency wound. Both in terms with an length rings are improving. I debrided this every 2 weeks with #5 curet and we have been using Hydrofera Blue and again making good progress With regards to the wounds between his right first and second toe which I thought might of been tinea pedis he is not making as much progress very dry scaly skin over the area. Also the area at the base of the left first and second toe in a similar condition 05/12/17; continued gradual improvement in the refractory left lateral venous insufficiency wound on the left. Dimension smaller. Surface still requiring debridement using Hydrofera Blue 05/19/17; continued gradual improvement in the refractory left lateral  venous ulceration. Careful inspection of the wound bed underlying rumination suggested some degree of epithelialization over the surface no debridement indicated. Continue Hydrofera Blue difficult areas between his toes first and third on the left than first and second on the right. I'm going to change to silver alginate from silver collagen. Continue ketoconazole as I suspect underlying tinea pedis 05/26/17; left lateral leg venous insufficiency wound. We've been using Hydrofera Blue. I believe that there is expanding epithelialization over the surface of the wound albeit not coming from the wound circumference. This is a bit of an odd situation in which the epithelialization seems to be coming from the surface of the wound rather than in the exact circumference. There is still small open areas mostly along the lateral margin of the wound. ooHe has unchanged areas between the left first and second and the right first second toes which I been treating for tenia pedis 06/02/17; left lateral leg venous insufficiency wound. We have been using Hydrofera Blue. Somewhat smaller from the wound circumference. The surface of the wound remains a bit on it almost epithelialized sedation in appearance. I use an open curette today debridement in the surface of all of this especially the edges ooSmall open wounds remaining on the dorsal right first and second toe interspace and the plantar left first second toe and her face on the left 06/09/17; wound on the left lateral leg continues to be smaller but very gradual and very dry surface using Hydrofera Blue 06/16/17 requires weekly debridements now on the left lateral leg although this continues to contract. I changed to silver collagen last week because of dryness of the wound bed. Using Iodoflex to the areas on his first and second toes/web space bilaterally 06/24/17; patient with history of paraplegia also chronic venous insufficiency with lymphedema. Has a very  difficult wound on the left lateral leg. This has been gradually reducing in terms of with but comes in with a very dry adherent surface. High switch to silver collagen a week or so ago with hydrogel to keep the area moist. This is been refractory to multiple dressing attempts. He also has areas in  his first and second toes bilaterally in the anterior and posterior web space. I had been using Iodoflex here after a prolonged course of silver alginate with ketoconazole was ineffective [question tinea pedis] 07/14/17; patient arrives today with a very difficult adherent material over his left lateral lower leg wound. He also has surrounding erythema and poorly controlled edema. He was switched his Santyl last visit which the nurses are applying once during his doctor visit and once on a nurse visit. He was also reduced to 2 layer compression I'm not exactly sure of the issue here. 07/21/17; better surface today after 1 week of Iodoflex. Significant cellulitis that we treated last week also better. [Doxycycline] 07/28/17 better surface today with now 2 weeks of Iodoflex. Significant cellulitis treated with doxycycline. He has now completed the doxycycline and he is back to his usual degree of chronic venous inflammation/stasis dermatitis. He reminds me he has had ablations surgery here 08/04/17; continued improvement with Iodoflex to the left lateral leg wound in terms of the surface of the wound although the dimensions are better. 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 Problematic areas on the plantar aspect of the first second toe web space on the left and the dorsal aspect of the first second toe web space on the right. At one point I felt these were probably related to chronic fungal infections in treated him aggressively for this although we have not made any improvement here. 08/11/17; left lateral leg. Surface continues to improve  with the Iodoflex although we are not seeing much improvement in overall wound dimensions. Areas on his plantar left foot and right foot show no improvement. In fact the right foot looks somewhat worse 08/18/17; left lateral leg. We changed to [facility] Blue last week after a prolonged course of Iodoflex which helps get the surface better. It appears that the wound with is improved. Continue with difficult areas on the left dorsal first second and plantar first second on the right 09/01/17; patient arrives in clinic today having had a temperature of 103 yesterday. He was seen in the ER and [facility]. The patient was concerned he could have cellulitis again in the right leg however they diagnosed him with a UTI and he is now on Keflex. He has a history of cellulitis which is been recurrent and difficult but this is been in the left leg, in the past 5 use doxycycline. He does in and out catheterizations at home which are risk factors for UTI 09/08/17; patient will be completing his Keflex this weekend. The erythema on the left leg is considerably better. He has a new wound today on the medial part of the right leg small superficial almost looks like a skin tear. He has worsening of the area on the right dorsal first and second toe. His major area on the left lateral leg is better. Using Hydrofera Blue on all areas 09/15/17; gradual reduction in width on the long wound in the left lateral leg. No debridement required. He also has wounds on the plantar aspect of his left first second toe web space and on the dorsal aspect of the right first second toe web space. 09/22/17; there continues to be very gradual improvements in the dimensions of the left lateral leg wound. He hasn't round erythematous spot with might be pressure on his wheelchair. There is no evidence obviously of infection no purulence no warmth ooHe has a dry scaled area on the  plantar aspect of the left first second toe ooImproved area on the  dorsal right first second toe. 09/29/17; left lateral leg wound continues to improve in dimensions mostly with an is still a fairly long but increasingly narrow wound. ooHe has a dry scaled area on the plantar aspect of his left first second toe web space ooIncreasingly concerning area on the dorsal right first second toe. In fact I am concerned today about possible cellulitis around this wound. The areas extending up his second toe and although there is deformities here almost appears to abut on the nailbed. 10/06/17; left lateral leg wound continues to make very gradual progress. Tissue culture I did from the right first second toe dorsal foot last time grew MRSA and enterococcus which was vancomycin sensitive. This was not sensitive to clindamycin or doxycycline. He is allergic to Zyvox and sulfa we have therefore arrange for him to have dalvance infusion tomorrow. He is had this in the past and tolerated it well 10/20/17; left lateral leg wound continues to make decent progress. This is certainly reduced in terms of with there is advancing epithelialization.ooThe cellulitis in the right foot looks better although he still has a deep wound in the dorsal aspect of the first second toe web space. Plantar left first toe web space on the left I think is making some progress 10/27/17; left lateral leg wound continues to make decent progress. Advancing epithelialization.using Hydrofera Blue ooThe right first second toe web space wound is better-looking using silver alginate ooImprovement in the left plantar first second toe web space. Again using silver alginate 11/03/17 left lateral leg wound continues to make decent progress albeit slowly. Using Hydrofera Blue ooThe right per second toe web space continues to be a very problematic looking punched out wound. I obtained a piece of tissue for deep culture I did extensively treated this for fungus. It is difficult to imagine that this is a pressure  area as the patient states other than going outside he doesn't really wear shoes at home ooThe left plantar first second toe web space looked fairly senescent. Necrotic edges. This required debridement oochange to Hydrofera Blue to all wound areas 11/10/17; left lateral leg wound continues to contract. Using Hydrofera Blue ooOn the right dorsal first second toe web space dorsally. Culture I did of this area last week grew MRSA there is not an easy oral option in this patient was multiple antibiotic allergies or intolerances. This was only a rare culture isolate I'm therefore going to use Bactroban under silver alginate ooOn the left plantar first second toe web space. Debridement is required here. This is also unchanged 11/17/17; left lateral leg wound continues to contract using Hydrofera Blue this is no longer the major issue. ooThe major concern here is the right first second toe web space. He now has an open area going from dorsally to the plantar aspect. There is now wound on the inner lateral part of the first toe. Not a very viable surface on this. There is erythema spreading medially into the forefoot. ooNo major change in the left first second toe plantar wound 11/24/17; left lateral leg wound continues to contract using Hydrofera Blue. Nice improvement today ooThe right first second toe web space all of this looks a lot less angry than last week. I have given him clindamycin and topical Bactroban for MRSA and terbinafine for the possibility of underlining tinea pedis that I could not control with ketoconazole. Looks somewhat better ooThe area on the plantar  left first second toe web space is weeping with dried debris around the wound 12/01/17; left lateral leg wound continues to contract he Hydrofera Blue. It is becoming thinner in terms of with nevertheless it is making good improvement. ooThe right first second toe web space looks less angry but still a large necrotic-looking  wounds starting on the plantar aspect of the right foot extending between the toes and now extensively on the base of the right second toe. I gave him clindamycin and topical Bactroban for MRSA anterior benefiting for the possibility of underlying tinea pedis. Not looking better today ooThe area on the left first/second toe looks better. Debrided of necrotic debris 12/05/17* the patient was worked in urgently today because over the weekend he found blood on his incontinence bad when he woke up. He was found to have an ulcer by his wife who does most of his wound care. He came in today for [place] to look at this. He has not had a history of wounds in his buttocks in spite of his paraplegia. 12/08/17; seen in follow-up today at his usual appointment. He was seen earlier this week and found to have a new wound on his buttock. We also follow him for wounds on the left lateral leg, left first second toe web space and right first second toe web space 12/15/17; we have been using Hydrofera Blue to the left lateral leg which has improved. The right first second toe web space has also improved. Left first second toe web space plantar aspect looks stable. The left buttock has worsened using Santyl. Apparently the buttock has drainage 12/22/17; we have been using Hydrofera Blue to the left lateral leg which continues to improve now 2 small wounds separated by normal skin. He tells [place] he had a fever up to 100 yesterday he is prone to UTIs but has not noted anything different. He does in and out catheterizations. The area between the first and second toes today does not look good necrotic surface covered with what looks to be purulent drainage and erythema extending into the third toe. I had gotten this to something that I thought look better last time however it is not look good today. He also has a necrotic surface over the buttock wound which is expanded. I thought there might be infection under here so I removed a  lot of the surface with a #5 curet though nothing look like it really needed culturing. He is been using Santyl to this area 12/27/17; his original wound on the left lateral leg continues to improve using Hydrofera Blue. I gave him samples of Baxdella although he was unable to take them out of fear for an allergic reaction ["lump in his throat"].the culture I did of the purulent drainage from his second toe last week showed both enterococcus and a set Enterobacter I was also concerned about the erythema on the bottom of his foot although paradoxically although this looks somewhat better today. 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 We've been using silver alginate here. They came up today that he sleeps in a recliner, I'm not sure why but I asked him to stop this 01/03/18; his original wound we've been using Hydrofera Blue is now separated into 2 areas. ooUlcer on his left buttock is better he is off the recliner and sleeping in bed ooFinally both wound areas between his first and second toes also  looks some better 01/10/18; his original wound on the left lateral leg is now separated into 2 wounds we've been using Hydrofera Blue ooUlcer on his left buttock has some drainage. There is a small probing site going into muscle layer superiorly.using silver alginate -He arrives today with a deep tissue injury on the left heel ooThe wound on the dorsal aspect of his first second toe on the left looks a lot betterusing silver alginate ketoconazole ooThe area on the first second toe web space on the right also looks a lot bette 01/17/18; his original wound on the left lateral leg continues to progress using Hydrofera Blue ooUlcer on his left buttock also is smaller surface healthier except for a small probing site going into the muscle layer superiorly. 2.4 cm of tunneling in this area ooDTI on his left heel  we have only been offloading. Looks better than last week no threatened open no evidence of infection oothe wound on the dorsal aspect of the first second toe on the left continues to look like it's regressing we have only been using silver alginate and terbinafine orally ooThe area in the first second toe web space on the right also looks to be a lot better using silver alginate and terbinafine I think this was prompted by tinea pedis 01/31/18; the patient was hospitalized in [place] last week apparently for a complicated UTI. He was discharged on cefepime he does in and out catheterizations. In the hospital he was discovered M I don't mild elevation of AST and ALT and the terbinafine was stopped.predictably the pressure ulcer on s his buttock looks betterusing silver alginate. The area on the left lateral leg also is better using Hydrofera Blue. The area between the first and second toes on the left better. First and second toes on the right still substantial but better. Finally the DTI on the left heel has held together and looks like it's resolving 02/07/18-he is here in follow-up evaluation for multiple ulcerations. He has new injury to the lateral aspect of the last issue a pressure ulcer, he states this is from adhesive removal trauma. He states he has tried multiple adhesive products with no success. All other ulcers appear stable. The left heel DTI is resolving. We will continue with same treatment plan and follow-up next week. 02/14/18; follow-up for multiple areas. ooHe has a new area last week on the lateral aspect of his pressure ulcer more over the posterior trochanter. The original pressure ulcer looks quite stable has healthy granulation. 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 Almost fully epithelialized on the original superior area using Hydrofera Blue ooDTI on the left  heel has peeled off this week to reveal a small superficial wound under denuded skin and subcutaneous tissue ooBoth areas between the first and second toes look better including nothing open on the left 02/21/18; ooThe patient's wounds on his left ischial tuberosity and posterior left greater trochanter actually looked better. He has a large area of irritation around the area which I think is contact dermatitis. I am doubtful that this is fungal ooHis original wound on the left lateral calf continues to improve we have been using Hydrofera Blue ooThere is no open area in the left first second toe web space although there is a lot of thick callus ooThe DTI on the left heel required debridement today of necrotic surface eschar and subcutaneous tissue using silver alginate   ooFinally the area on the right first second toe webspace continues to contract using silver alginate and ketoconazole 02/28/18 ooLeft ischial tuberosity wounds look better using silver alginate. ooOriginal wound on the left calf only has one small open area left using Hydrofera Blue ooDTI on the left heel required debridement mostly removing skin from around this wound surface. Using silver alginate ooThe areas on the right first/second toe web space using silver alginate and ketoconazole 03/08/18 on evaluation today patient appears to be doing decently well as best I can tell in regard to his wounds. This is the first time that I have seen him as he generally is followed by Dr. Dellia Nims. With that being said none of his wounds appear to be infected he does have an area where there is some skin covering what appears to be a new wound on the left dorsal surface of his great toe. This is right at the nail bed. With that being said I do believe that debrided away some of the excess skin can be of benefit in this regard. Otherwise he has been tolerating the dressing changes without complication. 03/14/18; patient arrives today with  the multiplicity of wounds that we are following. He has not been systemically unwell ooOriginal wound on the left lateral calf now only has 2 small open areas we've been using Hydrofera Blue which should continue ooThe deep tissue injury on the left heel requires debridement today. We've been using silver alginate ooThe left first second toe and the right first second toe are both are reminiscence what I think was tinea pedis. Apparently some of the callus Surface between the toes was removed last week when it started draining. ooPurulent drainage coming from the wound on the ischial tuberosity on the left. 03/21/18-He is here in follow-up evaluation for multiple wounds. 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 He tolerated debridement. The only change to last week's recommendations is to discontinue antifungal cream between toes. He will follow-up next week 03/28/18; following up for multiple wounds;Concern this week is streaking redness and swelling in the right foot. He is going to need antibiotics for this. 03/31/18; follow-up for right foot cellulitis. Streaking redness and swelling in the right foot on 03/28/18. 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 I put him on clindamycin 300 mg every 6 and brought him in for a quick check. He has an open wound between his first and second toes on the right foot as a potential source. 04/04/18; ooRight foot cellulitis is resolving he is completing clindamycin. This is truly good news ooLeft lateral calf wound which is initial wound only has one small open area inferiorly this is close to healing out. He has compression stockings. We will use Hydrofera Blue right down to the epithelialization of this ooNonviable surface on the left heel which was initially pressure with a DTI. We've been using Hydrofera Blue. I'm going to switch this back to silver  alginate ooLeft first second toe/tinea pedis this looks better using silver alginate ooRight first second toe tinea pedis using silver alginate ooLarge pressure ulcers on theLeft ischial tuberosity. Small wound here Looks better. I am uncertain about the surface over the large wound. Using silver alginate 04/11/18; ooCellulitis in the right foot is resolved ooLeft lateral calf wound which was his original wounds still has 2 tiny open areas remaining this is just about closed ooNonviable surface on the left heel is better but still  requires debridement ooLeft first second toe/tinea pedis still open using silver alginate ooRight first second toe wound tinea pedis I asked him to go back to using ketoconazole and silver alginate ooLarge pressure ulcers on the left ischial tuberosity this shear injury here is resolved. Wound is smaller. No evidence of infection using silver alginate 04/18/18; ooPatient arrives with an intense area of cellulitis in the right mid lower calf extending into the right heel area. Bright red and warm. Smaller area on the left anterior leg. He has a significant history of MRSA. He will definitely need antibioticsoodoxycycline ooHe now has 2 open areas on the left ischial tuberosity the original large wound and now a satellite area which I think was above his initial satellite areas. Not a wonderful surface on this satellite area surrounding erythema which looks like pressure related. ooHis left lateral calf wound again his original wound is just about closed ooLeft heel pressure injury still requiring debridement ooLeft first second toe looks a lot better using silver alginate ooRight first second toe also using silver alginate and ketoconazole cream also looks better 04/20/18; the patient was worked in early today out of concerns with his cellulitis on the right leg. I had started him on doxycycline. This was 2 days ago. His wife was concerned about the  swelling in the area. Also concerned about the left buttock. He has not been systemically unwell no fever chills. No nausea vomiting or diarrhea 04/25/18; the patient's left buttock wound is continued to deteriorate he is using Hydrofera Blue. He is still completing clindamycin for the cellulitis on the right leg although all of this looks better. 05/02/18 ooLeft buttock wound still with a lot of drainage and a very tightly adherent fibrinous necrotic surface. He has a deeper area superiorly ooThe left lateral calf wound is still closed ooDTI wound on the left heel necrotic surface especially the circumference using Iodoflex ooAreas between his left first second toe and right first second toe both look better. Dorsally and the right first second toe he had a necrotic surface although at smaller. In using silver alginate and ketoconazole. I did a culture last week which was a deep tissue culture of the reminiscence of the open wound on the right first second toe dorsally. This grew a few Acinetobacter and a few methicillin-resistant staph aureus. Nevertheless the area actually this week looked better. I didn't feel the need to specifically address this at least in terms of systemic antibiotics. 05/09/18; wounds are measuring larger more drainage per our intake. We are using Santyl covered with alginate on the large superficial buttock wounds, Iodosorb on the left heel, ketoconazole and silver alginate to the dorsal first and second toes bilaterally. 05/16/18; ooThe area on his left buttock better in some aspects although the area superiorly over the ischial tuberosity required an extensive debridement.using Santyl ooLeft heel appears stable. Using Iodoflex ooThe areas between his first and second toes are not bad however there is spreading erythema up the dorsal aspect of his left foot this looks like cellulitis again. He is insensate the erythema is really very brilliant.o Erysipelas He went to  see an allergist days ago because he was itching part of this he had lab work done. This showed a white count of 15.1 with 70% neutrophils. Hemoglobin of 11.4 and a platelet count of 659,000. Last white count we had in Epic was a 2-1/2 years ago which was 25.9 but he was ill at the time. He was able to show me  some lab work that was done by his primary physician the pattern is about the same. 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 But prompted me to go ahead and do x-rays of both feet and the pelvis rule out osteomyelitis. He also had a comprehensive metabolic panel this was reasonably normal his albumin was 3.7 liver function tests BUN/creatinine all normal 05/23/18; x-rays of both his feet from last week were negative for underlying pulmonary abnormality. The x-ray of his pelvis however showed mild irregularity in the left ischial which may represent some early osteomyelitis. The wound in the left ischial continues to get deeper clearly now exposed muscle. Each week necrotic surface material over this area. Whereas the rest of the wounds do not look so bad. ooThe left ischial wound we have been using Santyl and calcium alginate ooT the left heel surface necrotic debris using Iodoflex o ooThe left lateral leg is still healed ooAreas on the left dorsal foot and the right dorsal foot are about the same. There is some inflammation on the left which might represent contact dermatitis, fungal dermatitis I am doubtful cellulitis although this looks better than last week 05/30/18; CT scan done at Hospital did not show any osteomyelitis or abscess. Suggested the possibility of underlying cellulitis although I don't see a lot of evidence of this at the bedside ooThe wound itself on the left buttock/upper thigh actually looks somewhat better. No debridement ooLeft heel also looks better no debridement continue Iodoflex ooBoth dorsal first second toe spaces  appear better using Lotrisone. Left still required debridement 06/06/18; ooIntake reported some purulent looking drainage from the left gluteal wound. Using Santyl and calcium alginate ooLeft heel looks better although still a nonviable surface requiring debridement ooThe left dorsal foot first/second webspace actually expanding and somewhat deeper. I may consider doing a shave biopsy of this area ooRight dorsal foot first/second webspace appears stable to improved. Using Lotrisone and silver alginate to both these areas 06/13/18 ooLeft gluteal surface looks better. Now separated in the 2 wounds. No debridement required. Still drainage. We'll continue silver alginate ooLeft heel continues to look better with Iodoflex continue this for at least another week ooOf his dorsal foot wounds the area on the left still has some depth although it looks better than last week. We've been using Lotrisone and silver alginate 06/20/18 ooLeft gluteal continues to look better healthy tissue ooLeft heel continues to look better healthy granulation wound is smaller. He is using Iodoflex and his long as this continues continue the Iodoflex ooDorsal right foot looks better unfortunately dorsal left foot does not. There is swelling and erythema of his forefoot. He had minor trauma to this several days ago but doesn't think this was enough to have caused any tissue injury. Foot looks like cellulitis, we have had this problem before 06/27/18 on evaluation today patient appears to be doing a little worse in regard to his foot ulcer. 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 Both of which would really be his only options for treating this infection. In the past he has been given and effusion of Orbactiv. This is done very well for him in the past again it's one time dosing IV  antibiotic therapy. Subsequently I do believe this is something we're gonna need to see about doing at this point  in time. Currently his other wounds seem to be doing somewhat better in my pinion I'm pretty happy in that regard. 07/03/18 on evaluation today patient's wounds actually appear to be doing fairly well. He has been tolerating the dressing changes without complication. All in all he seems to be showing signs of improvement. In regard to the antibiotics he has been dealing with infectious disease since I saw him last week as far as getting this scheduled. In the end [facility]'s [facility] this coming Friday. In the meantime he has been continuing to perform the dressing changes in such as previous. There does not appear to be any evidence of infection worsengin at this time. 07/10/18; ooSince I last saw this man 2 weeks ago things have actually improved. IV antibiotics of resulted in less forefoot erythema although there is still some present. He is not systemically unwell ooLeft buttock wounds o2 now have no depth there is increased epithelialization Using silver alginate ooLeft heel still requires debridement using Iodoflex ooLeft dorsal foot still with a sizable wound about the size of a border but healthy granulation ooRight dorsal foot still with a slitlike area using silver alginate 07/18/18; the patient's cellulitis in the left foot is improved in fact I think it is on its way to resolving. ooLeft buttock wounds o2 both look better although the larger one has hypertension granulation we've been using silver alginate ooLeft heel has some thick circumferential redundant skin over the wound edge which will need to be removed today we've been using Iodoflex ooLeft dorsal foot is still a sizable wound required debridement using silver alginate ooThe right dorsal foot is just about closed only a small open area remains  here 07/25/18; left foot cellulitis is resolved ooLeft buttock wounds o2 both look better. Hyper-granulation on the major area ooLeft heel as some debris over the surface but otherwise looks a healthier wound. Using silver collagen ooRight dorsal foot is just about closed 07/31/18; arrives with our intake nurse worried about purulent drainage from the buttock. We had hyper-granulation here last week ooHis buttock wounds o2 continue to look better ooLeft heel some debris over the surface but measuring smaller. ooRight dorsal foot unfortunately has openings between the toes ooLeft foot superficial wound looks less aggravated. 08/07/18 ooButtock wounds continue to look better although some of her granulation and the larger medial wound. silver alginate ooLeft heel continues to look a lot better.silver collagen ooLeft foot superficial wound looks less stable. Requires debridement. He has a new wound superficial area on the foot on the lateral dorsal foot. ooRight foot looks better using silver alginate without Lotrisone 08/14/2018; patient was in the ER last week diagnosed with a UTI. He is now on Cefpodoxime and Macrodantin. ooButtock wounds continued to be smaller. Using silver alginate ooLeft heel continues to look better using silver collagen ooLeft foot superficial wound looks as though it is improving ooRight dorsal foot area is just about healed. 08/21/2018; patient is completed his antibiotics for his UTI. ooHe has 2 open areas on the buttocks. There is still not closed although the surface looks satisfactory. Using silver alginate ooLeft heel continues to improve using silver collagen ooThe bilateral dorsal foot areas which are at the base of his first and second toes/possible tinea pedis are actually stable on the left but worse on the right. The area on the left required debridement of necrotic surface. After debridement I obtained a specimen for PCR  culture. ooThe right  dorsal foot which is been just about healed last week is now reopened 08/28/2018; culture done on the left dorsal foot showed coag negative staph both staph epidermidis and Lugdunensis. I think this is worthwhile initiating systemic treatment. I will use doxycycline given his long list of allergies. The area on the left heel slightly improved but still requiring debridement. ooThe large wound on the buttock is just about closed whereas the smaller one is larger. Using silver alginate in this area 09/04/2018; patient is completing his doxycycline for the left foot although this continues to be a very difficult wound area with very adherent necrotic debris. We are using silver alginate to all his wounds right foot left foot and the small wounds on his buttock, silver collagen on the left heel. 09/11/2018; once again this patient has intense erythema and swelling of the left forefoot. Lesser degrees of erythema in the right foot. He has a long list of allergies and intolerances. I will reinstitute doxycycline. 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 Using silver alginate ooLeft heel also looks better using silver collagen ooUnfortunately both the areas on his feet look worse. The area on the left first second webspace is now gone through to the plantar part of his foot. The area on the left foot anteriorly is irritated with erythema and swelling in the forefoot. 09/25/2018 ooHis wound on the left plantar heel looks better. Using silver collagen ooThe area on the left buttock 2 small remnant areas. One is closed one is still open. Using silver alginate ooThe areas between both his first and second toes look worse. This in spite of long-standing antifungal therapy with ketoconazole and silver alginate which should have antifungal activity ooHe has small areas around his original wound on the left calf one is on the bottom of the original scar  tissue and one superiorly both of these are small and superficial but again given wound history in this site this is worrisome 10/02/2018 ooLeft plantar heel continues to gradually contract using silver collagen ooLeft buttock wound is unchanged using silver alginate ooThe areas on his dorsal feet between his first and second toes bilaterally look about the same. 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 Small open area here. We have been using silver alginate 10/09/2018; ooLeft plantar heel continues to look healthy and contract. No debridement is required ooLeft buttock slightly smaller with a tape injury wound just below which was new this week ooDorsal feet somewhat improved I have been using clindamycin ooLeft lateral looks lower extremity the actual open area looks worse although a lot of this is epithelialized. I am going to change to silver collagen today He has a lot more swelling in the right leg although this is not pitting not red and not particularly warm there is a lot of spasm in the right leg usually indicative of people with paralysis of some underlying discomfort. We have reviewed his vascular status from 2017 he had a left greater saphenous vein ablation. I wonder about referring him back to vascular surgery if the area on the left leg continues to deteriorate. 10/16/2018 in today for follow-up and management of multiple lower extremity ulcers. His left Buttock wound is much lower smaller and almost closed completely. The wound to  the left ankle has began to reopen with Epithelialization and some adherent slough. He has multiple new areas to the left foot and leg. The left dorsal foot without much improvement. Wound present between left great webspace and 2nd toe. Erythema and edema present  right leg. Right LE ultrasound obtained on 10/10/18 was negative for DVT . 10/23/2018; ooLeft buttock is closed over. Still dry macerated skin but there is no open wound. I suspect this is chronic pressure/moisture ooLeft lateral calf is quite a bit worse than when I saw this last. There is clearly drainage here he has macerated skin into the left plantar heel. We will change the primary dressing to alginate ooLeft dorsal foot has some improvement in overall wound area. Still using clindamycin and silver alginate ooRight dorsal foot about the same as the left using clindamycin and silver alginate ooThe erythema in the right leg has resolved. He is DVT rule out was negative ooLeft heel pressure area required debridement although the wound is smaller and the surface is health 10/26/2018 ooThe patient came back in for his nurse check today predominantly because of the drainage coming out of the left lateral leg with a recent reopening of his original wound on the left lateral calf. He comes in today with a large amount of surrounding erythema around the wound extending from the calf into the ankle and even in the area on the dorsal foot. He is not systemically unwell. He is not febrile. Nevertheless this looks like cellulitis. We have been using silver alginate to the area. I changed him to a regular visit and I am going to prescribe him doxycycline. The rationale here is a long list of medication intolerances and a history of MRSA. I did not see anything that I thought would provide a valuable culture 10/30/2018 ooFollow-up from his appointment 4 days ago with really an extensive area of cellulitis in the left calf left lateral ankle and left dorsal foot. I put him on doxycycline. He has a long list of medication allergies which are true allergy reactions. 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 In any case he is a lot  better today. The erythema especially in the anterior and lateral left calf is better. He still has left ankle erythema. He also is complaining about increasing edema in the right leg we have only been using Kerlix Coban and he has been doing the wraps at home. Finally he has a spotty rash on the medial part of his upper left calf which looks like folliculitis or perhaps wrap occlusion type injury. Small superficial macules not pustules 11/06/18 patient arrives today with again a considerable degree of erythema around the wound on the left lateral calf extending into the dorsal ankle and dorsal foot. This is a lot worse than when I saw this last week. He is on doxycycline really with not a lot of improvement. He has not been systemically unwell Wounds on the; left heel actually looks improved. Original area on the left foot and proximity to the first and second toes looks about the same. He has superficial areas on the dorsal foot, anterior calf and then the reopening of his original wound on the left lateral calf which looks about the same ooThe only area he has on the right is the dorsal webspace first and second which is smaller. ooHe has a large area of dry erythematous skin on the left buttock small open area  here. 11/13/2018; the patient arrives in much better condition. The erythema around the wound on the left lateral calf is a lot better. Not sure whether this was the clindamycin or the TCA and ketoconazole or just in the improvement in edema control [stasis dermatitis]. In any case this is a lot better. The area on the left heel is very small and just about resolved using silver collagen we have been using silver alginate to the areas on his dorsal feet 11/20/2018; his wounds include the left lateral calf, left heel, dorsal aspects of both feet just proximal to the first second webspace. He is stable to slightly improved. I did not think any changes to his dressings were going to be  necessary 11/27/2018 he has a reopening on the left buttock which is surrounded by what looks like tinea or perhaps some other form of dermatitis. The area on the left dorsal foot has some erythema around it I have marked this area but I am not sure whether this is cellulitis or not. Left heel is not closed. Left calf the reopening is really slightly longer and probably worse 1/13; in general things look better and smaller except for the left dorsal foot. Area on the left heel is just about closed, left buttock looks better only a small wound remains in the skin looks better [using Lotrisone] 1/20; the area on the left heel only has a few remaining open areas here. Left lateral calf about the same in terms of size, left dorsal foot slightly larger right lateral foot still not closed. The area on the left buttock has no open wound and the surrounding skin looks a lot better 1/27; the area on the left heel is closed. Left lateral calf better but still requiring extensive debridements. The area on his left buttock is closed. He still has the open areas on the left dorsal foot which is slightly smaller in the right foot which is slightly expanded. We have been using Iodoflex on these areas as well 2/3; left heel is closed. Left lateral calf still requiring debridement using Iodoflex there is no open area on his left buttock however he has dry scaly skin over a large area of this. Not really responding well to the Lotrisone. Finally the areas on his dorsal feet at the level of the first second webspace are slightly smaller on the right and about the same on the left. Both of these vigorously debrided with Anasept and gauze 2/10; left heel remains closed he has dry erythematous skin over the left buttock but there is no open wound here. Left lateral leg has come in and with. Still requiring debridement we have been using Iodoflex here. Finally the area on the left dorsal foot and right dorsal foot are really  about the same extremely dry callused fissured areas. He does not yet have a dermatology appointment 2/17; left heel remains closed. He has a new open area on the left buttock. The area on the left lateral calf is bigger longer and still covered in necrotic debris. No major change in his foot areas bilaterally. I am awaiting for a dermatologist to look on this. We have been using ketoconazole I do not know that this is been doing any good at all. 2/24; left heel remains closed. The left buttock wound that was new reopening last week looks better. The left lateral calf appears better also although still requires debridement. The major area on his foot is the left first second also requiring  debridement. We have been putting Prisma on all wounds. I do not believe that the ketoconazole has done too much good for his feet. He will use Lotrisone I am going to give him a 2-week course of terbinafine. We still do not have a dermatology appointment 3/2 left heel remains closed however there is skin over bone in this area I pointed this out to him today. The left buttock wound is epithelialized but still does not look completely stable. The area on the left leg required debridement were using silver collagen here. With regards to his feet we changed to Lotrisone last week and silver alginate. 3/9; left heel remains closed. Left buttock remains closed. The area on the right foot is essentially closed. The left foot remains unchanged. Slightly smaller on the left lateral calf. Using silver collagen to both of these areas 3/16-Left heel remains closed. Area on right foot is closed. Left lateral calf above the lateral malleolus open wound requiring debridement with easy bleeding. Left dorsal wound proximal to first toe also debrided. Left ischial area open new. Patient has been using Prisma with wrapping every 3 days. Dermatology appointment is apparently tomorrow.Patient has completed his terbinafine 2-week  course with some apparent improvement according to him, there is still flaking and dry skin in his foot on the left 3/23; area on the right foot is reopened. The area on the left anterior foot is about the same still a very necrotic adherent surface. He still has the area on the left leg and reopening is on the left buttock. He apparently saw dermatology although I do not have a note. According to the patient who is usually fairly well informed they did not have any good ideas. Put him on oral terbinafine which she is been on before. 3/30; using silver collagen to all wounds. Apparently his dermatologist put him on doxycycline and rifampin presumably some culture grew staph. I do not have this result. He remains on terbinafine although I have used terbinafine on him before 4/6; patient has had a fairly substantial reopening on the right foot between the first and second toes. He is finished his terbinafine and I believe is on doxycycline and rifampin still as prescribed by dermatology. We have been using silver collagen to all his wounds although the patient reports that he thinks silver alginate does better on the wounds on his buttock. 4/13; the area on his left lateral calf about the same size but it did not require debridement. ooLeft dorsal foot just proximal to the webspace between the first and second toes is about the same. Still nonviable surface. I note some superficial bronze discoloration of the dorsal part of his foot ooRight dorsal foot just proximal to the first and second toes also looks about the same. I still think there may be the same discoloration I noted above on the left ooLeft buttock wound looks about the same 4/20; left lateral calf appears to be gradually contracting using silver collagen. 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 The left dorsal foot wound is debrided of tightly adherent necrotic debris and  really cleans up quite nicely. The right area is worse with expansion. I did not debride this it is now over the base of the second toe ooThe area on his left buttock is smaller no debridement is required using silver collagen 5/4; left calf continues to make good progress. ooHe arrives with erythema around the wounds on his dorsal foot which even  extends to the plantar aspect. Very concerning for coexistent infection. He is finished the erythromycin I gave him for possible erythrasma this does not seem to have helped. ooThe area on the left foot is about the same base of the dorsal toes ooIs area on the buttock looks improved on the left 5/11; left calf and left buttock continued to make good progress. Left foot is about the same to slightly improved. ooMajor problem is on the right foot. He has not had an x-ray. Deep tissue culture I did last week showed both Enterobacter and E. coli. I did not change the doxycycline I put him on empirically although neither 1 of these were plated to doxycycline. He arrives today with the erythema looking worse on both the dorsal and plantar foot. Macerated skin on the bottom of the foot. he has not been systemically unwell 5/18-Patient returns at 1 week, left calf wound appears to be making some progress, left buttock wound appears slightly worse than last time, left foot wound looks slightly better, right foot redness is marginally better. X-ray of both feet show no air or evidence of osteomyelitis. Patient is finished his Omnicef and terbinafine. He continues to have macerated skin on the bottom of the left foot as well as right 5/26; left calf wound is better, left buttock wound appears to have multiple small superficial open areas with surrounding macerated skin. X-rays that I did last time showed no evidence of osteomyelitis in either foot. He is finished cefdinir and doxycycline. I do not think that he was on terbinafine. He continues to have a large  superficial open area on the right foot anterior dorsal and slightly between the first and second toes. I did send him to dermatology 2 months ago or so wondering about whether they would do a fungal scraping. I do not believe they did but did do a culture. We have been using silver alginate to the toe areas, he has been using antifungals at home topically either ketoconazole or Lotrisone. We are using silver collagen on the left foot, silver alginate on the right, silver collagen on the left lateral leg and silver alginate on the left buttock 6/1; left buttock area is healed. We have the left dorsal foot, left lateral leg and right dorsal foot. We are using silver alginate to the areas on both feet and silver collagen to the area on his left lateral calf 6/8; the left buttock apparently reopened late last week. He is not really sure how this happened. He is tolerating the terbinafine. Using silver alginate to all wounds 6/15; left buttock wound is larger than last week but still superficial. ooCame in the clinic today with a report of purulence from the left lateral leg I did not identify any infection ooBoth areas on his dorsal feet appear to be better. He is tolerating the terbinafine. Using silver alginate to all wounds 6/22; left buttock is about the same this week, left calf quite a bit better. His left foot is about the same however he comes in with erythema and warmth in the right forefoot once again. Culture that I gave him in the beginning of May showed Enterobacter and E. coli. I gave him doxycycline and things seem to improve although neither 1 of these organisms was specifically plated. 6/29; left buttock is larger and dry this week. Left lateral calf looks to me to be improved. Left dorsal foot also somewhat improved right foot completely unchanged. The erythema on the right foot is  still present. He is completing the Ceftin dinner that I gave him empirically [see discussion  above.) 7/6 - All wounds look to be stable and perhaps improved, the left buttock wound is slightly smaller, per patient bleeds easily, completed ceftin, the right foot redness is less, he is on terbinafine 7/13; left buttock wound about the same perhaps slightly narrower. Area on the left lateral leg continues to narrow. Left dorsal foot slightly smaller right foot about the same. We are using silver alginate on the right foot and Hydrofera Blue to the areas on the left. Unna boot on the left 2 layer compression on the right 7/20; left buttock wound absolutely the same. Area on lateral leg continues to get better. Left dorsal foot require debridement as did the right no major change in the 7/27; left buttock wound the same size necrotic debris over the surface. The area on the lateral leg is closed once again. His left foot looks better right foot about the same although there is some involvement now of the posterior first second toe area. He is still on terbinafine which I have given him for a month, not certain a centimeter major change 06/25/19-All wounds appear to be slightly improved according to report, left buttock wound looks clean, both foot wounds have minimal to no debris the right dorsal foot has minimal slough. We are using Hydrofera Blue to the left and silver alginate to the right foot and ischial wound. 8/10-Wounds all appear to be around the same, the right forefoot distal part has some redness which was not there before, however the wound looks clean and small. Ischial wound looks about the same with no changes 8/17; his wound on the left lateral calf which was his original chronic venous insufficiency wound remains closed. Since I last saw him the areas on the left dorsal foot right dorsal foot generally appear better but require debridement. The area on his left initial tuberosity appears somewhat larger to me perhaps hyper granulated and bleeds very easily. We have been using  Hydrofera Blue to the left dorsal foot and silver alginate to everything else 8/24; left lateral calf remains closed. The areas on his dorsal feet on the webspace of the first and second toes bilaterally both look better. The area on the left buttock which is the pressure ulcer stage II slightly smaller. I change the dressing to Hydrofera Blue to all areas 8/31; left lateral calf remains closed. The area on his dorsal feet bilaterally look better. Using Hydrofera Blue. Still requiring debridement on the left foot. No change in the left buttock pressure ulcers however 9/14; left lateral calf remains closed. Dorsal feet look quite a bit better than 2 weeks ago. Flaking dry skin also a lot better with the ammonium lactate I gave him 2 weeks ago. The area on the left buttock is improved. He states that his Roho cushion developed a leak and he is getting a new one, in the interim he is offloading this vigorously 9/21; left calf remains closed. Left heel which was a possible DTI looks better this week. He had macerated tissue around the left dorsal foot right foot looks satisfactory and improved left buttock wound. I changed his dressings to his feet to silver alginate bilaterally. Continuing Hydrofera Blue on the left buttock. 9/28 left calf remains closed. Left heel did not develop anything [possible DTI] dry flaking skin on the left dorsal foot. Right foot looks satisfactory. Improved left buttock wound. We are using silver alginate on  his feet Hydrofera Blue on the buttock. I have asked him to go back to the Lotrisone on his feet including the wounds and surrounding areas 10/5; left calf remains closed. The areas on the left and right feet about the same. A lot of this is epithelialized however debris over the remaining open areas. He is using Lotrisone and silver alginate. The area on the left buttock using Hydrofera Blue 10/26. Patient has been out for 3 weeks secondary to Covid concerns. He tested  negative but I think his wife tested positive. He comes in today with the left foot substantially worse, right foot about the same. Even more concerning he states that the area on his left buttock closed over but then reopened and is considerably deeper in one aspect than it was before [stage III wound] 11/2; left foot really about the same as last week. Quarter sized wound on the dorsal foot just proximal to the first second toes. Surrounding erythema with areas of denuded epithelium. This is not really much different looking. Did not look like cellulitis this time however. ooRight foot area about the same.. We have been using silver alginate alginate on his toes ooLeft buttock still substantial irritated skin around the wound which I think looks somewhat better. We have been using Hydrofera Blue here. 11/9; left foot larger than last week and a very necrotic surface. Right foot I think is about the same perhaps slightly smaller. Debris around the circumference also addressed. Unfortunately on the left buttock there is been a decline. Satellite lesions below the major wound distally and now a an additional one posteriorly we have been using Hydrofera Blue but I think this is a pressure issue 11/16; left foot ulcer dorsally again a very adherent necrotic surface. Right foot is about the same. Not much change in the pressure ulcer on his left buttock. 11/30; left foot ulcer dorsally basically the same as when I saw him 2 weeks ago. Very adherent fibrinous debris on the wound surface. Patient reports a lot of drainage as well. 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 We have been using Iodoflex, patient changed back to alginate because of the drainage. Area on his right dorsal foot really looks benign with a healthier surface certainly a lot better than on the left. Left buttock wounds all improved using Hydrofera Blue 12/7; left dorsal foot again no  improvement. Tightly adherent debris. PCR culture I did last week only showed likely skin contaminant. I have gone ahead and done a punch biopsy of this which is about the last thing in terms of investigations I can think to do. He has known venous insufficiency and venous hypertension and this could be the issue here. The area on the right foot is about the same left buttock slightly worse according to our intake nurse secondary to [facility] Blue sticking to the wound 12/14; biopsy of the left foot that I did last time showed changes that could be related to wound healing/chronic stasis dermatitis phenomenon no neoplasm. We have been using silver alginate to both feet. I change the one on the left today to Sorbact and silver alginate to his other 2 wounds 12/28; the patient arrives with the following problems; ooMajor issue is the dorsal left foot which continues to be a larger deeper wound area. Still with a completely nonviable surface ooParadoxically the area mirror image on the right on the right dorsal foot appears to be getting better. ooHe had  some loss of dry denuded skin from the lower part of his original wound on the left lateral calf. Some of this area looked a little vulnerable and for this reason we put him in wrap that on this side this week ooThe area on his left buttock is larger. He still has the erythematous circular area which I think is a combination of pressure, sweat. This does not look like cellulitis or fungal dermatitis 11/26/2019; -Dorsal left foot large open wound with depth. Still debris over the surface. Using Sorbact ooThe area on the dorsal right foot paradoxically has closed over [facility] has a reopening on the left ankle laterally at the base of his original wound that extended up into the calf. This appears clean. ooThe left buttock wound is smaller but with very adherent necrotic debris over the surface. We have been using silver alginate here as well The  patient had arterial studies done in 2017. He had biphasic waveforms at the dorsalis pedis and posterior tibial bilaterally. ABI in the left was 1.17. Digit waveforms were dampened. He has slight spasticity in the great toes I do not think a TBI would be possible 1/11; the patient comes in today with a sizable reopening between the first and second toes on the right. This is not exactly in the same location where we have been treating wounds previously. According to our intake nurse this was actually fairly deep but 0.6 cm. The area on the left dorsal foot looks about the same the surface is somewhat cleaner using Sorbact, his MRI is in 2 days. We have not managed yet to get arterial studies. The new reopening on the left lateral calf looks somewhat better using alginate. The left buttock wound is about the same using alginate 1/18; the patient had his ARTERIAL studies which were quite normal. ABI in the right at 1.13 with triphasic/biphasic waveforms on the left ABI 1.06 again with triphasic/biphasic waveforms. It would not have been possible to have done a toe brachial index because of spasticity. We have been using Sorbac to the left foot alginate to the rest of his wounds on the right foot left lateral calf and left buttock 1/25; arrives in clinic with erythema and swelling of the left forefoot worse over the first MTP area. This extends laterally dorsally and but also posteriorly. Still has an area on the left lateral part of the lower part of his calf wound it is eschared and clearly not closed. ooArea on the left buttock still with surrounding irritation and erythema. ooRight foot surface wound dorsally. The area between the right and first and second toes appears better. 2/1; ooThe left foot wound is about the same. Erythema slightly better I gave him a week of doxycycline empirically ooRight foot wound is more extensive extending between the toes to the plantar surface ooLeft lateral  calf really no open surface on the inferior part of his original wound however the entire area still looks vulnerable ooAbsolutely no improvement in the left buttock wound required debridement. 2/8; the left foot is about the same. Erythema is slightly improved I gave him clindamycin last week. ooRight foot looks better he is using Lotrimin and silver alginate ooHe has a breakdown in the left lateral calf. Denuded epithelium which I have removed ooLeft buttock about the same were using Hydrofera Blue 2/15; left foot is about the same there is less surrounding erythema. Surface still has tightly adherent debris which I have debriding however not making any progress ooRight foot  has a substantial wound on the medial right second toe between the first and second webspace. ooStill an open area on the left lateral calf distal area. ooButtock wound is about the same 2/22; left foot is about the same less surrounding erythema. Surface has adherent debris. Polymen Ag Right foot area significant wound between the first and second toes. We have been using silver alginate here Left lateral leg polymen Ag at the base of his original venous insufficiency wound ooLeft buttock some improvement here 3/1; ooRight foot is deteriorating in the first second toe webspace. Larger and more substantial. We have been using silver alginate. ooLeft dorsal foot about the same markedly adherent surface debris using PolyMem Ag ooLeft lateral calf surface debris using PolyMem AG ooLeft buttock is improved again using PolyMem Ag. ooHe is completing his terbinafine. The erythema in the foot seems better. He has been on this for 2 weeks 3/8; no improvement in any wound area in fact he has a small open area on the dorsal midfoot which is new this week. He has not gotten his foot x-rays yet 3/15; his x-rays were both negative for osteomyelitis of both feet. No major change in any of his wounds on the extremities  however his buttock wounds are better. We have been using polymen on the buttocks, left lower leg. Iodoflex on the left foot and silver alginate on the right 3/22; arrives in clinic today with the 2 major issues are the improvement in the left dorsal foot wound which for once actually looks healthy with a nice healthy wound surface without debridement. Using Iodoflex here. Unfortunately on the left lateral calf which is in the distal part of his original wound he came to the clinic here for there was purulent drainage noted some increased breakdown scattered around the original area and a small area proximally. We we are using polymen here will change to silver alginate today. His buttock wound on the left is better and I think the area on the right first second toe webspace is also improved 3/29; left dorsal foot looks better. Using Iodoflex. Left ankle culture from deterioration last time grew E. coli, Enterobacter and Enterococcus. I will give him a course of cefdinir although that will not cover Enterococcus. The area on the right foot in the webspace of the first and second toe lateral first toe looks better. The area on his buttock is about healed Vascular appointment is on April 21. This is to look at his venous system vis--vis continued breakdown of the wounds on the left including the left lateral leg and left dorsal foot he. He has had previous ablations on this side 4/5; the area between the right first and second toes lateral aspect of the first toe looks better. Dorsal aspect of the left first toe on the left foot also improved. Unfortunately the left lateral lower leg is larger and there is a second satellite wound superiorly. The usual superficial abrasions on the left buttock overall better but certainly not closed 4/12; the area between the right first and second toes is improved. Dorsal aspect of the left foot also slightly smaller with a vibrant healthy looking surface. No real  change in the left lateral leg and the left buttock wound is healed He has an unaffordable co-pay for Apligraf. Appointment with vein and vascular with regards to the left leg venous part of the circulation is on 4/21 4/19; we continue to see improvement in all wound areas. Although this is minor. He  has his vascular appointment on 4/21. The area on the left buttock has not reopened although right in the center of this area the skin looks somewhat threatened 4/26; the left buttock is unfortunately reopened. In general his left dorsal foot has a healthy surface and looks somewhat smaller although it was not measured as such. The area between his first and second toe webspace on the right as a small wound against the first toe. The patient saw vascular surgery. The real question I was asking was about the small saphenous vein on the left. He has previously ablated left greater saphenous vein. Nothing further was commented on on the left. Right greater saphenous vein without reflux at the saphenofemoral junction or proximal thigh there was no indication for ablation of the right greater saphenous vein duplex was negative for DVT bilaterally. They did not think there was anything from a vascular surgery point of view that could be offered. They ABIs within normal limits 5/3; only small open area on the left buttock. The area on the left lateral leg which was his original venous reflux is now 2 wounds both which look clean. We are using Iodoflex on the left dorsal foot which looks healthy and smaller. He is down to a very tiny area between the right first and second toes, using silver alginate 5/10; all of his wounds appear better. We have much better edema control in 4 layer compression on the left. This may be the factor that is allowing the left foot and left lateral calf to heal. He has external compression garments at home 04/14/20-All of his wounds are progressing well, the left forefoot is  practically closed, left ischium appears to be about the same, right toe webspace is also smaller. The left lateral leg is about the same, continue using Hydrofera Blue to this, silver alginate to the ischium, Iodoflex to the toe space on the right 6/7; most of his wounds outside of the left buttock are doing well. The area on the left lateral calf and left dorsal foot are smaller. The area on the right foot in between the first and second toe webspace is barely visible although he still says there is some drainage here is the only reason I did not heal this out. ooUnfortunately the area on the left buttock almost looks like he has a skin tear from tape. He has open wound and then a large flap of skin that we are trying to get adherence over an area just next to the remaining wound 6/21; 2 week follow-up. I believe is been here for nurse visits. Miraculously the area between his first and second toes on the left dorsal foot is closed over. Still open on the right first second web space. The left lateral calf has 2 open areas. Distally this is more superficial. The proximal area had a little more depth and required debridement of adherent necrotic material. His buttock wound is actually larger we have been using silver alginate here 6/28; the patient's area on the left foot remains closed. Still open wet area between the first and second toes on the right and also extending into the plantar aspect. We have been using silver alginate in this location. He has 2 areas on the left lower leg part of his original long wounds which I think are better. We have been using Hydrofera Blue here. Hydrofera Blue to the left buttock which is stable 7/12; left foot remains closed. Left ankle is closed. May be a small  area between his right first and second toes the only truly open area is on the left buttock. We have been using Hydrofera Blue here 7/19; patient arrives with marked deterioration especially in the left  foot and ankle. We did not put him in a compression wrap on the left last week in fact he wore his juxta lite stockings on either side although he does not have an underlying stocking. He has a reopening on the left dorsal foot, left lateral ankle and a new area on the right dorsal ankle. More worrisome is the degree of erythema on the left foot extending on the lateral foot into the lateral lower leg on the left 7/26; the patient had erythema and drainage from the lateral left ankle last week. Culture of this grew MRSA resistant to doxycycline and clindamycin which are the 2 antibiotics we usually use with this patient who has multiple antibiotic allergies including linezolid, trimethoprim sulfamethoxazole. I had give him an empiric doxycycline and he comes in the area certainly looks somewhat better although it is blotchy in his lower leg. He has not been systemically unwell. He has had areas on the left dorsal foot which is a reopening, chronic wounds on the left lateral ankle. Both of these I think are secondary to chronic venous insufficiency. The area between his first and second toes is closed as far as I can tell. He had a new wrap injury on the right dorsal ankle last week. Finally he has an area on the left buttock. We have been using silver alginate to everything except the left buttock we are using Hydrofera Blue 06/30/20-Patient returns at 1 week, has been given a sample dose pack of NUZYRA which is a tetracycline derivative [omadacycline], patient has completed those, we have been using silver alginate to almost all the wounds except the left ischium where we are using Hydrofera Blue all of them look better Objective Constitutional alert and oriented x 3. sitting or standing blood pressure is within target range for patient.. supine blood pressure is within target range for patient.. pulse regular and within target range for patient.Marland Kitchen respirations regular, non-labored and within target  range for patient.Marland Kitchen temperature within target range for patient.. Well- nourished and well-hydrated in no acute distress. Vitals Time Taken: 7:50 AM, Height: 70 in, Source: Stated, Weight: 216 lbs, Source: Stated, BMI: 31, Temperature: 98.2 F, Pulse: 101 bpm, Respiratory Rate: 18 breaths/min, Blood Pressure: 125/79 mmHg. General Notes: Wounds on left dorsum foot, forefoot, right dorsum forefoot, left lateral foot, left ischium all look smaller with healthy granulation Integumentary (Hair, Skin) Wound #37R status is Open. Original cause of wound was Gradually Appeared. The wound is located on the Left,Lateral Malleolus. The wound measures 0.1cm length x 0.1cm width x 0.1cm depth; 0.008cm^2 area and 0.001cm^3 volume. The wound is limited to skin breakdown. There is no tunneling or undermining noted. There is a small amount of serous drainage noted. The wound margin is flat and intact. There is no granulation within the wound bed. There is no necrotic tissue within the wound bed. Wound #38 status is Open. Original cause of wound was Gradually Appeared. The wound is located on the Right T - Web between 1st and 2nd. The wound oe measures 3cm length x 1.5cm width x 0.3cm depth; 3.534cm^2 area and 1.06cm^3 volume. There is Fat Layer (Subcutaneous Tissue) Exposed exposed. There is no tunneling or undermining noted. There is a small amount of serosanguineous drainage noted. The wound margin is distinct with the  outline attached to the wound base. There is large (67-100%) red granulation within the wound bed. There is no necrotic tissue within the wound bed. Wound #41 status is Open. Original cause of wound was Gradually Appeared. The wound is located on the Left Ischium. The wound measures 1cm length x 0.4cm width x 0.1cm depth; 0.314cm^2 area and 0.031cm^3 volume. There is Fat Layer (Subcutaneous Tissue) Exposed exposed. There is no tunneling or undermining noted. There is a small amount of  serosanguineous drainage noted. The wound margin is distinct with the outline attached to the wound base. There is large (67-100%) red granulation within the wound bed. There is no necrotic tissue within the wound bed. Wound #42 status is Open. Original cause of wound was Gradually Appeared. The wound is located on the Right,Dorsal Ankle. The wound measures 1.4cm length x 1cm width x 0.1cm depth; 1.1cm^2 area and 0.11cm^3 volume. There is Fat Layer (Subcutaneous Tissue) Exposed exposed. There is no tunneling or undermining noted. There is a medium amount of serosanguineous drainage noted. The wound margin is flat and intact. There is large (67-100%) red granulation within the wound bed. There is a small (1-33%) amount of necrotic tissue within the wound bed including Adherent Slough. Wound #43 status is Open. Original cause of wound was Gradually Appeared. The wound is located on the Left,Dorsal Foot. The wound measures 0.1cm length x 0.1cm width x 0.1cm depth; 0.008cm^2 area and 0.001cm^3 volume. The wound is limited to skin breakdown. There is no tunneling or undermining noted. There is a small amount of serous drainage noted. The wound margin is indistinct and nonvisible. There is small (1-33%) pink granulation within the wound bed. There is no necrotic tissue within the wound bed. Assessment Active Problems ICD-10 Chronic venous hypertension (idiopathic) with ulcer and inflammation of left lower extremity 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 to breakdown of skin Non-pressure chronic ulcer of right ankle limited to breakdown of skin Non-pressure chronic ulcer of other part of right foot limited to breakdown of skin Non-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 Cellulitis of left lower limb Pressure ulcer of left buttock, stage 3 Paraplegia, complete Procedures Wound #37R Pre-procedure diagnosis of Wound #37R is a Venous Leg Ulcer located on  the Left,Lateral Malleolus . There was a Four Layer Compression Therapy Procedure by Levan Hurst, RN. Post procedure Diagnosis Wound #37R: Same as Pre-Procedure Wound #43 Pre-procedure diagnosis of Wound #43 is an Inflammatory located on the Left,Dorsal Foot . There was a Four Layer Compression Therapy Procedure by Levan Hurst, RN. Post procedure Diagnosis Wound #43: Same as Pre-Procedure Plan Follow-up Appointments: Return Appointment in 1 week. Dressing Change Frequency: Wound #37R Left,Lateral Malleolus: Do not change entire dressing for one week. Wound #38 Right T - Web between 1st and 2nd: oe Change Dressing every other day. Wound #41 Left Ischium: Change Dressing every other day. Wound #42 Right,Dorsal Ankle: Change Dressing every other day. Wound #43 Left,Dorsal Foot: Do not change entire dressing for one week. Skin Barriers/Peri-Wound Care: Moisturizing lotion - both legs Wound Cleansing: May shower with protection. - use cast protector Primary Wound Dressing: Wound #37R Left,Lateral Malleolus: Calcium Alginate with Silver Other: - Mupirocin under alginate Wound #38 Right T - Web between 1st and 2nd: oe Calcium Alginate with Silver Wound #41 Left Ischium: Hydrofera Blue Wound #42 Right,Dorsal Ankle: Calcium Alginate with Silver Wound #43 Left,Dorsal Foot: Calcium Alginate with Silver Secondary Dressing: Wound #37R Left,Lateral Malleolus: Dry Gauze  Wound #38 Right T - Web between 1st and 2nd: oe Kerlix/Rolled Gauze Dry Gauze Wound #41 Left Ischium: Dry Gauze ABD pad Wound #42 Right,Dorsal Ankle: Kerlix/Rolled Gauze - secure with tape Dry Gauze Wound #43 Left,Dorsal Foot: Dry Gauze Edema Control: 4 layer compression: Left lower extremity Elevate legs to the level of the heart or above for 30 minutes daily and/or when sitting, a frequency of: - throughout the day Support Garment 30-40 mm/Hg pressure to: - Juxtalite to right leg  daily Off-Loading: Low air-loss mattress (Group 2) Roho cushion for wheelchair Turn and reposition every 2 hours - out of wheelchair throughout the day, try to lay on sides, sleep in the bed not the recliner -Continue using silver alginate to the foot wounds and Hydrofera Blue to the ischial wound -Patient return next week -Continue current edema control Foley compression to left lower extremity and juxta lite to the right 30-40 mercury pressure -Continue offloading, turning and repositioning, out of wheelchair periodically Electronic Signature(s) Signed: 06/30/2020 8:30:16 AM By: Tobi Bastos MD, MBA Entered By: Tobi Bastos on 06/30/2020 08:30:16 -------------------------------------------------------------------------------- SuperBill Details Patient Name: Date of Service: Rezek, [name] 06/30/2020 Medical Record Number: [id number] Patient Account Number: [id number] Date of Birth/Sex: Treating RN: 03-Apr-1986 (34 y.o. [name] Primary Care Provider: [place], [place] Other Clinician: Referring Provider: Treating Provider/Extender: Shyrl Numbers, [name]: 234 Diagnosis Coding ICD-10 Codes Code Description I87.332 Chronic venous hypertension (idiopathic) with ulcer and inflammation of left lower extremity L97.321 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 to breakdown of skin L97.311 Non-pressure chronic ulcer of right ankle limited to breakdown of skin L97.511 Non-pressure chronic ulcer of other part of right foot limited to breakdown of skin L97.521 Non-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 L03.116 Cellulitis of left lower limb 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, 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, complete Facility Procedures CPT4 Code: 29476546 Description: (Facility Use Only) 29581LT - APPLY MULTLAY COMPRS LWR LT LEG Modifier: Quantity: 1 Physician Procedures : CPT4 Code Description Modifier  [id number] - WC PHYS LEVEL 3 - EST PT ICD-10 Diagnosis Description I87.332 Chronic venous hypertension (idiopathic) with ulcer and inflammation of left lower extremity Quantity: 1 Electronic Signature(s) Signed: 06/30/2020 8:30:42 AM By: Tobi Bastos MD, MBA Entered By: Tobi Bastos on 06/30/2020 08:30:42

## 2020-06-30 NOTE — Progress Notes (Signed)
[name], [name] ([id number]) Visit Report for 06/30/2020 Arrival Information Details Patient Name: Date of Service: [name], [name] 06/30/2020 7:30 [name] Medical Record Number: [id number] Patient Account Number: [id number] Date of Birth/Sex: Treating RN: [birth year] (34 y.o. [name] Primary Care Tashon Capp: [name], [name] Other Clinician: Referring Waldo Damian: Treating Johnnie Goynes/Extender: Shyrl Numbers, [name]: 45 Visit Information History Since Last Visit Added or deleted any medications: No Patient Arrived: Wheel Chair Any new allergies or adverse reactions: No Arrival Time: 07:47 Had [name] fall or experienced change in No Accompanied By: self activities of daily living that may affect Transfer Assistance: None risk of falls: Patient Identification Verified: Yes Signs or symptoms of abuse/neglect since last visito No Secondary Verification Process Completed: Yes Hospitalized since last visit: No Patient Requires Transmission-Based Precautions: No Implantable device outside of the clinic excluding No Patient Has Alerts: Yes cellular tissue based products placed in the center Patient Alerts: R ABI = 1.0 since last visit: L ABI = 1.1 Has Dressing in Place as Prescribed: Yes Has Compression in Place as Prescribed: Yes Pain Present Now: No Electronic Signature(s) Signed: 06/30/2020 4:51:12 PM By: Baruch Gouty RN, BSN Entered By: Baruch Gouty on 06/30/2020 07:48:22 -------------------------------------------------------------------------------- Compression Therapy Details Patient Name: Date of Service: [name], [name] 06/30/2020 7:30 [name] Medical Record Number: [id number] Patient Account Number: [id number] Date of Birth/Sex: Treating RN: 08/08/1986 (34 y.o. [name] Primary Care Jayland Null: [name], [name] Other Clinician: Referring Camron Monday: Treating Leticia Mcdiarmid/Extender: Shyrl Numbers, [name]: 234 Compression Therapy  Performed for Wound Assessment: Wound #37R Left,Lateral Malleolus Performed By: Clinician Levan Hurst, RN Compression Type: Four Layer Post Procedure Diagnosis Same as Pre-procedure Electronic Signature(s) Signed: 06/30/2020 5:19:36 PM By: Levan Hurst RN, BSN Entered By: Levan Hurst on 06/30/2020 08:26:41 -------------------------------------------------------------------------------- Compression Therapy Details Patient Name: Date of Service: [name], [name] 06/30/2020 7:30 [name] Medical Record Number: [id number] Patient Account Number: [id number] Date of Birth/Sex: Treating RN: Dec 26, 1985 (34 y.o. [name] Primary Care Jerin Franzel: [name], [name] Other Clinician: Referring Mishika Flippen: Treating Chau Savell/Extender: Shyrl Numbers, [name]: 234 Compression Therapy Performed for Wound Assessment: Wound #43 Left,Dorsal Foot Performed By: Clinician Levan Hurst, RN Compression Type: Four Layer Post Procedure Diagnosis Same as Pre-procedure Electronic Signature(s) Signed: 06/30/2020 5:19:36 PM By: Levan Hurst RN, BSN Entered By: Levan Hurst on 06/30/2020 08:26:41 -------------------------------------------------------------------------------- Encounter Discharge Information Details Patient Name: Date of Service: [name], [name] 06/30/2020 7:30 [name] Medical Record Number: [id number] Patient Account Number: [id number] Date of Birth/Sex: Treating RN: [birth year] (34 y.o. [name] Primary Care Jerolene Kupfer: [place], [place] Other Clinician: Referring Smith Mcnicholas: Treating Jacelyn Cuen/Extender: Glenda Chroman [name]: 67 Encounter Discharge Information Items Discharge Condition: Stable Ambulatory Status: Wheelchair Discharge Destination: Home Transportation: Private Auto Accompanied By: self Schedule Follow-up Appointment: Yes Clinical Summary of Care: Patient Declined Electronic Signature(s) Signed: 06/30/2020 5:16:06 PM  By: Kela Millin Entered By: Kela Millin on 06/30/2020 08:49:40 -------------------------------------------------------------------------------- Lower Extremity Assessment Details Patient Name: Date of Service: [name], [name] 06/30/2020 7:30 [name] Medical Record Number: [id number] Patient Account Number: [id number] Date of Birth/Sex: Treating RN: [birth year] (34 y.o. [name] Primary Care [place] Dann: [place], [place] Other Clinician: Referring Yanina Knupp: Treating Nechelle Petrizzo/Extender: Shyrl Numbers, [name]: 234 Edema Assessment Assessed: [Left: No] [Right: No] Edema: [Left: No] [Right: Yes] Calf Left: Right: Point of Measurement: 33 cm From Medial Instep 26.4 cm 32.6 cm Ankle Left: Right: Point of Measurement: 10  cm From Medial Instep 23.2 cm 23.4 cm Vascular Assessment Pulses: Dorsalis Pedis Palpable: [Left:Yes] [Right:Yes] Electronic Signature(s) Signed: 06/30/2020 4:51:12 PM By: Baruch Gouty RN, BSN Entered By: Baruch Gouty on 06/30/2020 08:04:59 -------------------------------------------------------------------------------- Multi-Disciplinary Care Plan Details Patient Name: Date of Service: [name], [name] 06/30/2020 7:30 [name] Medical Record Number: [id number] Patient Account Number: [id number] Date of Birth/Sex: Treating RN: [birth year] (34 y.o. [name] Primary Care Grantley Savage: [name], [name] Other Clinician: Referring Nollie Shiflett: Treating Curley Hogen/Extender: Shyrl Numbers, [name]: 234 Active Inactive Wound/Skin Impairment Nursing Diagnoses: Impaired tissue integrity Knowledge deficit related to ulceration/compromised skin integrity Goals: Patient/caregiver will verbalize understanding of skin care regimen Date Initiated: 01/05/2016 Target Resolution Date: 07/11/2020 Goal Status: Active Ulcer/skin breakdown will have [name] volume reduction of 30% by week 4 Date Initiated: 01/05/2016 Date  Inactivated: 12/22/2017 Target Resolution Date: 01/19/2018 Unmet Reason: complex wounds, Goal Status: Unmet infection Interventions: Assess patient/caregiver ability to obtain necessary supplies Assess ulceration(s) every visit Provide education on ulcer and skin care Notes: Electronic Signature(s) Signed: 06/30/2020 5:19:36 PM By: Levan Hurst RN, BSN Entered By: Levan Hurst on 06/30/2020 07:50:44 -------------------------------------------------------------------------------- Pain Assessment Details Patient Name: Date of Service: [name], [name] 06/30/2020 7:30 [name] Medical Record Number: [id number] Patient Account Number: [id number] Date of Birth/Sex: Treating RN: 10-30-1986 (34 y.o. [name] Primary Care Lakya Schrupp: [name], [name] Other Clinician: Referring Anah Billard: Treating Adeel Guiffre/Extender: Shyrl Numbers, [name]: 234 Active Problems Location of Pain Severity and Description of Pain Patient Has Paino No Site Locations Rate the pain. Current Pain Level: 0 Pain Management and Medication Current Pain Management: Electronic Signature(s) Signed: 06/30/2020 4:51:12 PM By: Baruch Gouty RN, BSN Entered By: Baruch Gouty on 06/30/2020 07:48:46 -------------------------------------------------------------------------------- Patient/Caregiver Education Details Patient Name: Date of Service: [name], [name] 8/9/2021andnbsp7:30 [name] Medical Record Number: [id number] Patient Account Number: [id number] Date of Birth/Gender: Treating RN: 07/29/1986 (34 y.o. [name] Primary Care Physician: Janine Limbo Other Clinician: Referring Physician: Treating Physician/Extender: Glenda Chroman [name]: 83 Education Assessment Education Provided To: Patient Education Topics Provided Wound/Skin Impairment: Methods: Explain/Verbal Responses: State content correctly Electronic Signature(s) Signed: 06/30/2020  5:19:36 PM By: Levan Hurst RN, BSN Entered By: Levan Hurst on 06/30/2020 09:29:24 -------------------------------------------------------------------------------- Wound Assessment Details Patient Name: Date of Service: [name], [name] 06/30/2020 7:30 [name] Medical Record Number: [id number] Patient Account Number: [id number] Date of Birth/Sex: Treating RN: 1986/05/10 (34 y.o. [name] Primary Care Anmarie Fukushima: [name], [name] Other Clinician: Referring Breck [place]: Treating Jonel Weldon/Extender: Shyrl Numbers, [name]: 234 Wound Status Wound Number: [id number] Primary Etiology: Venous Leg Ulcer Wound Location: Left, Lateral Malleolus Wound Status: Open Wounding Event: Gradually Appeared Comorbid History: Sleep Apnea, Hypertension, Paraplegia Date Acquired: 11/26/2019 Weeks Of Treatment: 31 Clustered Wound: No Wound Measurements Length: (cm) 0.1 Width: (cm) 0.1 Depth: (cm) 0.1 Area: (cm) 0.008 Volume: (cm) 0.001 % Reduction in Area: 99.8% % Reduction in Volume: 99.7% Epithelialization: Large (67-100%) Tunneling: No Undermining: No Wound Description Classification: Full Thickness Without Exposed Support Structures Wound Margin: Flat and Intact Exudate Amount: Small Exudate Type: Serous Exudate Color: amber Foul Odor After Cleansing: No Slough/Fibrino No Wound Bed Granulation Amount: None Present (0%) Exposed Structure Necrotic Amount: None Present (0%) Fascia Exposed: No Fat Layer (Subcutaneous Tissue) Exposed: No Tendon Exposed: No Muscle Exposed: No Joint Exposed: No Bone Exposed: No Limited to Skin Breakdown Treatment Notes Wound #37R (Left, Lateral Malleolus) 1. Cleanse With Wound Cleanser Soap and water 2. Periwound  Care Moisturizing lotion 3. Primary Dressing Applied Calcium Alginate Ag Other primary dressing (specifiy in notes) 4. Secondary Dressing Dry Gauze 6. Support Layer Applied 4 layer compression wrap Notes kerlix  instead of cotton, mupirocin under silver alginate to lateral malleolus Electronic Signature(s) Signed: 06/30/2020 4:51:12 PM By: Baruch Gouty RN, BSN Entered By: Baruch Gouty on 06/30/2020 08:14:38 -------------------------------------------------------------------------------- Wound Assessment Details Patient Name: Date of Service: [name], [name] 06/30/2020 7:30 [name] Medical Record Number: [id number] Patient Account Number: [id number] Date of Birth/Sex: Treating RN: May 11, 1986 (34 y.o. [name] Primary Care Marbella Markgraf: [name], [name] Other Clinician: Referring Shawnte Demarest: Treating Ricci Dirocco/Extender: Shyrl Numbers, [name]: 234 Wound Status Wound Number: 38 Primary Etiology: Inflammatory Wound Location: Right T - Web between 1st and 2nd oe Wound Status: Open Wounding Event: Gradually Appeared Comorbid History: Sleep Apnea, Hypertension, Paraplegia Date Acquired: 11/30/2019 Weeks Of Treatment: 30 Clustered Wound: No Wound Measurements Length: (cm) 3 Width: (cm) 1.5 Depth: (cm) 0.3 Area: (cm) 3.534 Volume: (cm) 1.06 % Reduction in Area: -970.9% % Reduction in Volume: -358.9% Epithelialization: Large (67-100%) Tunneling: No Undermining: No Wound Description Classification: Full Thickness Without Exposed Support Structures Wound Margin: Distinct, outline attached Exudate Amount: Small Exudate Type: Serosanguineous Exudate Color: red, brown Foul Odor After Cleansing: No Slough/Fibrino Yes Wound Bed Granulation Amount: Large (67-100%) Exposed Structure Granulation Quality: Red Fat Layer (Subcutaneous Tissue) Exposed: Yes Necrotic Amount: None Present (0%) Treatment Notes Wound #38 (Right Toe - Web between 1st and 2nd) 1. Cleanse With Wound Cleanser 3. Primary Dressing Applied Calcium Alginate Ag 4. Secondary Dressing Dry Gauze Roll Gauze 5. Secured With Recruitment consultant) Signed: 06/30/2020 4:51:12 PM By: Baruch Gouty  RN, BSN Entered By: Baruch Gouty on 06/30/2020 08:15:17 -------------------------------------------------------------------------------- Wound Assessment Details Patient Name: Date of Service: [name], [name] 06/30/2020 7:30 [name] Medical Record Number: [id number] Patient Account Number: [id number] Date of Birth/Sex: Treating RN: [birth year] (34 y.o. [name] Primary Care Ridwan Bondy: [name], [name] Other Clinician: Referring Shirl Weir: Treating Tresea Heine/Extender: Shyrl Numbers, [name]: 234 Wound Status Wound Number: 41 Primary Etiology: Pressure Ulcer Wound Location: Left Ischium Wound Status: Open Wounding Event: Gradually Appeared Comorbid History: Sleep Apnea, Hypertension, Paraplegia Date Acquired: 03/16/2020 Weeks Of Treatment: 15 Clustered Wound: No Wound Measurements Length: (cm) 1 Width: (cm) 0.4 Depth: (cm) 0.1 Area: (cm) 0.314 Volume: (cm) 0.031 % Reduction in Area: 82.6% % Reduction in Volume: 82.9% Epithelialization: Medium (34-66%) Tunneling: No Undermining: No Wound Description Classification: Category/Stage II Wound Margin: Distinct, outline attached Exudate Amount: Small Exudate Type: Serosanguineous Exudate Color: red, brown Foul Odor After Cleansing: No Slough/Fibrino No Wound Bed Granulation Amount: Large (67-100%) Exposed Structure Granulation Quality: Red Fascia Exposed: No Necrotic Amount: None Present (0%) Fat Layer (Subcutaneous Tissue) Exposed: Yes Tendon Exposed: No Muscle Exposed: No Joint Exposed: No Bone Exposed: No Treatment Notes 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 Cleanse With Wound Cleanser 3. Primary Dressing Applied Hydrofera Blue 4. Secondary Dressing Foam Border Dressing Electronic Signature(s) Signed: 06/30/2020 4:51:12 PM By: Baruch Gouty RN, BSN Entered By: Baruch Gouty on 06/30/2020 08:15:43 -------------------------------------------------------------------------------- Wound  Assessment Details Patient Name: Date of Service: St[place], [name] 06/30/2020 7:30 [name] Medical Record Number: [id number] Patient Account Number: [id number] Date of Birth/Sex: Treating RN: [birth year] (34 y.o. [name] Primary Care Monick Rena: Other Clinician: Janine Limbo Referring Refujio Haymer: Treating Saraann Enneking/Extender: Shyrl Numbers, [name]: 234 Wound Status Wound Number: 42 Primary Etiology: Venous Leg Ulcer Wound Location: Right, Dorsal Ankle Wound  Status: Open Wounding Event: Gradually Appeared Comorbid History: Sleep Apnea, Hypertension, Paraplegia Date Acquired: 06/06/2020 Weeks Of Treatment: 3 Clustered Wound: No Wound Measurements Length: (cm) 1.4 Width: (cm) 1 Depth: (cm) 0.1 Area: (cm) 1.1 Volume: (cm) 0.11 % Reduction in Area: 6.6% % Reduction in Volume: 6.8% Epithelialization: Small (1-33%) Tunneling: No Undermining: No Wound Description Classification: Full Thickness Without Exposed Support Structures Wound Margin: Flat and Intact Exudate Amount: Medium Exudate Type: Serosanguineous Exudate Color: red, brown Foul Odor After Cleansing: No Slough/Fibrino Yes Wound Bed Granulation Amount: Large (67-100%) Exposed Structure Granulation Quality: Red Fascia Exposed: No Necrotic Amount: Small (1-33%) Fat Layer (Subcutaneous Tissue) Exposed: Yes Necrotic Quality: Adherent Slough Tendon Exposed: No Muscle Exposed: No Joint Exposed: No Bone Exposed: No Treatment Notes Wound #42 (Right, Dorsal Ankle) 1. Cleanse With Wound Cleanser 3. Primary Dressing Applied Calcium Alginate Ag 4. Secondary Dressing Dry Gauze Roll Gauze 5. Secured With Recruitment consultant) Signed: 06/30/2020 4:51:12 PM By: Baruch Gouty RN, BSN Entered By: Baruch Gouty on 06/30/2020 08:16:20 -------------------------------------------------------------------------------- Wound Assessment Details Patient Name: Date of Service: [place], [name]  [name] 06/30/2020 7:30 [name] Medical Record Number: [id number] Patient Account Number: [id number] Date of Birth/Sex: Treating RN: 20-Feb-1986 (34 y.o. [name] Primary Care Shweta Aman: [name], [name] Other Clinician: Referring Miron Marxen: Treating Renny Gunnarson/Extender: Shyrl Numbers, [name]: 234 Wound Status Wound Number: 43 Primary Etiology: Inflammatory Wound Location: Left, Dorsal Foot Wound Status: Open Wounding Event: Gradually Appeared Comorbid History: Sleep Apnea, Hypertension, Paraplegia Date Acquired: 06/06/2020 Weeks Of Treatment: 3 Clustered Wound: No Wound Measurements Length: (cm) 0.1 Width: (cm) 0.1 Depth: (cm) 0.1 Area: (cm) 0.008 Volume: (cm) 0.001 % Reduction in Area: 95.9% % Reduction in Volume: 95% Epithelialization: Large (67-100%) Tunneling: No Undermining: No Wound Description Classification: Full Thickness Without Exposed Support Structures Wound Margin: Indistinct, nonvisible Exudate Amount: Small Exudate Type: Serous Exudate Color: amber Foul Odor After Cleansing: No Slough/Fibrino Yes Wound Bed Granulation Amount: Small (1-33%) Exposed Structure Granulation Quality: Pink Fascia Exposed: No Necrotic Amount: None Present (0%) Fat Layer (Subcutaneous Tissue) Exposed: No Tendon Exposed: No Muscle Exposed: No Joint Exposed: No Bone Exposed: No Limited to Skin Breakdown Treatment Notes Wound #43 (Left, Dorsal Foot) 1. Cleanse With Wound Cleanser Soap and water 2. Periwound Care Moisturizing lotion 3. Primary Dressing Applied Calcium Alginate Ag Other primary dressing (specifiy in notes) 4. Secondary Dressing Dry Gauze 6. Support Layer Applied 4 layer compression wrap Notes kerlix instead of cotton, mupirocin under silver alginate to lateral malleolus Electronic Signature(s) Signed: 06/30/2020 4:51:12 PM By: Baruch Gouty RN, BSN Entered By: Baruch Gouty on 06/30/2020  08:17:01 -------------------------------------------------------------------------------- [place] Details Patient Name: Date of Service: [place], [name] 06/30/2020 7:30 [name] Medical Record Number: [id number] Patient Account Number: [id number] Date of Birth/Sex: Treating RN: [birth year] (34 y.o. [name] Primary Care Stanisha Lorenz: [place], [place] Other Clinician: Referring Landen Breeland: Treating Sascha Palma/Extender: Shyrl Numbers, [name]: 234 Vital Signs Time Taken: 07:50 Temperature (F): 98.2 Height (in): 70 Pulse (bpm): 101 Source: Stated Respiratory Rate (breaths/min): 18 Weight (lbs): 216 Blood Pressure (mmHg): 125/79 Source: Stated Reference Range: 80 - 120 mg / dl Body Mass Index (BMI): 31 Electronic Signature(s) Signed: 06/30/2020 4:51:12 PM By: Baruch Gouty RN, BSN Entered By: Baruch Gouty on 06/30/2020 07:50:53

## 2020-07-07 ENCOUNTER — Encounter (HOSPITAL_BASED_OUTPATIENT_CLINIC_OR_DEPARTMENT_OTHER): Payer: BC Managed Care – PPO | Admitting: Internal Medicine

## 2020-07-07 DIAGNOSIS — L97311 Non-pressure chronic ulcer of right ankle limited to breakdown of skin: Secondary | ICD-10-CM | POA: Diagnosis not present

## 2020-07-07 DIAGNOSIS — L97511 Non-pressure chronic ulcer of other part of right foot limited to breakdown of skin: Secondary | ICD-10-CM | POA: Diagnosis not present

## 2020-07-07 DIAGNOSIS — Z89422 Acquired absence of other left toe(s): Secondary | ICD-10-CM | POA: Diagnosis not present

## 2020-07-07 DIAGNOSIS — L89323 Pressure ulcer of left buttock, stage 3: Secondary | ICD-10-CM | POA: Diagnosis not present

## 2020-07-07 DIAGNOSIS — L97521 Non-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: Secondary | ICD-10-CM | POA: Diagnosis not present

## 2020-07-07 DIAGNOSIS — G8221 Paraplegia, complete: Secondary | ICD-10-CM | POA: Diagnosis not present

## 2020-07-07 DIAGNOSIS — L97321 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 to breakdown of skin: Secondary | ICD-10-CM | POA: Diagnosis not present

## 2020-07-07 DIAGNOSIS — I87332 Chronic venous hypertension (idiopathic) with ulcer and inflammation of left lower extremity: Secondary | ICD-10-CM | POA: Diagnosis not present

## 2020-07-07 DIAGNOSIS — L97312 Non-pressure chronic ulcer of right 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: Secondary | ICD-10-CM | POA: Diagnosis not present

## 2020-07-07 DIAGNOSIS — L03116 Cellulitis of left lower limb: Secondary | ICD-10-CM | POA: Diagnosis not present

## 2020-07-07 DIAGNOSIS — L97512 Non-pressure chronic ulcer of other part of right foot with fat layer exposed: Secondary | ICD-10-CM | POA: Diagnosis not present

## 2020-07-07 DIAGNOSIS — L97329 Non-pressure chronic ulcer of left ankle with unspecified severity: Secondary | ICD-10-CM | POA: Diagnosis not present

## 2020-07-07 DIAGNOSIS — L98419 Non-pressure chronic ulcer of buttock with unspecified severity: Secondary | ICD-10-CM | POA: Diagnosis not present

## 2020-07-07 NOTE — Progress Notes (Signed)
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 aggressively for this although we have not made any improvement here. 08/11/17; left lateral leg. Surface continues to improve with the Iodoflex although we are not seeing much improvement in overall wound dimensions. Areas on his plantar left foot and right foot show no improvement. In fact the right foot looks somewhat worse 08/18/17; left lateral leg. We changed to [facility] Blue last week after a prolonged course of Iodoflex which helps get the surface better. It appears that the wound with is improved. Continue with difficult areas on the left dorsal first second and plantar first second on the right  09/01/17; patient arrives in clinic today having had a temperature of 103 yesterday. He was seen in the ER and [facility]. The patient was concerned he could have cellulitis again in the right leg however they diagnosed him with a UTI and he is now on Keflex. He has a history of cellulitis which is been recurrent and difficult but this is been in the left leg, in the past 5 use doxycycline. He does in and out catheterizations at home which are risk factors for UTI 09/08/17; patient will be completing his Keflex this weekend. The erythema on the left leg is considerably better. He has a new  wound today on the medial part of the right leg small superficial almost looks like a skin tear. He has worsening of the area on the right dorsal first and second toe. His major area on the left lateral leg is better. Using Hydrofera Blue on all areas 09/15/17; gradual reduction in width on the long wound in the left lateral leg. No debridement required. He also has wounds on the plantar aspect of his left first second toe web space and on the dorsal aspect of the right first second toe web space. 09/22/17; there continues to be very gradual improvements in the dimensions of the left lateral leg wound. He hasn't round erythematous spot with might be pressure on his wheelchair. There is no evidence obviously of infection no purulence no warmth He has a dry scaled area on the plantar aspect of the left first second toe Improved area on the dorsal right first second toe. 09/29/17; left lateral leg wound continues to improve in dimensions mostly with an is still a fairly long but increasingly narrow wound. He has a dry scaled area on the plantar aspect of his left first second toe web space Increasingly concerning area on the dorsal right first second toe. In fact I am concerned today about possible cellulitis around this wound. The areas extending up his second toe and although there is deformities here almost appears to abut on the nailbed. 10/06/17; left lateral leg wound continues to make very gradual progress. Tissue culture I did from the right first second toe dorsal foot last time grew MRSA and enterococcus which was vancomycin sensitive. This was not sensitive to clindamycin or doxycycline. He is allergic to Zyvox and sulfa we have therefore arrange for him to have dalvance infusion tomorrow. He is had this in the past and tolerated it well 10/20/17; left lateral leg wound continues to make decent progress. This is certainly reduced in terms of with there is advancing epithelialization.The  cellulitis in the right foot looks better although he still has a deep wound in the dorsal aspect of the first second toe web space. Plantar left first toe web space on the left I think is making some progress 10/27/17; left lateral leg wound continues to make decent progress. Advancing epithelialization.using Hydrofera Blue The right first second toe web space wound is better-looking using silver alginate Improvement in the left plantar first second toe web space. Again using silver alginate 11/03/17 left lateral leg wound continues to make decent progress albeit slowly. Using [facility] The right per second toe web space continues to be a very problematic looking punched out wound. I obtained a piece of tissue for deep culture I did extensively treated this for fungus. It is difficult to imagine that this is a pressure area as the patient states other than going outside he doesn't really wear shoes at home The  left plantar first second toe web space looked fairly senescent. Necrotic edges. This required debridement change to [facility] Blue to all wound areas 11/10/17; left lateral leg wound continues to contract. Using Hydrofera Blue On the right dorsal first second toe web space dorsally. Culture I did of this area last week grew MRSA there is not an easy oral option in this patient was multiple antibiotic allergies or intolerances. This was only a rare culture isolate I'm therefore going to use Bactroban under silver alginate On the left plantar first second toe web space. Debridement is required here. This is also unchanged 11/17/17; left lateral leg wound continues to contract using Hydrofera Blue this is no longer the major issue. The major concern here is the right first second toe web space. He now has an open area going from dorsally to the plantar aspect. There is now wound on the inner lateral part of the first toe. Not a very viable surface on this. There is erythema spreading  medially into the forefoot. No major change in the left first second toe plantar wound 11/24/17; left lateral leg wound continues to contract using Hydrofera Blue. Nice improvement today The right first second toe web space all of this looks a lot less angry than last week. I have given him clindamycin and topical Bactroban for MRSA and terbinafine for the possibility of underlining tinea pedis that I could not control with ketoconazole. Looks somewhat better The area on the plantar left first second toe web space is weeping with dried debris around the wound 12/01/17; left lateral leg wound continues to contract he Hydrofera Blue. It is becoming thinner in terms of with nevertheless it is making good improvement. The right first second toe web space looks less angry but still a large necrotic-looking wounds starting on the plantar aspect of the right foot extending between the toes and now extensively on the base of the right second toe. I gave him clindamycin and topical Bactroban for MRSA anterior benefiting for the possibility of underlying tinea pedis. Not looking better today The area on the left first/second toe looks better. Debrided of necrotic debris 12/05/17* the patient was worked in urgently today because over the weekend he found blood on his incontinence bad when he woke up. He was found to have an ulcer by his wife who does most of his wound care. He came in today for [place] to look at this. He has not had a history of wounds in his buttocks in spite of his paraplegia. 12/08/17; seen in follow-up today at his usual appointment. He was seen earlier this week and found to have a new wound on his buttock. We also follow him for wounds on the left lateral leg, left first second toe web space and right first second toe web space 12/15/17; we have been using Hydrofera Blue to the left lateral leg which has improved. The right first second toe web space has also improved. Left first second toe web  space plantar aspect looks stable. The left buttock has worsened using Santyl. Apparently the buttock has drainage 12/22/17; we have been using Hydrofera Blue to the left lateral leg which continues to improve now 2 small wounds separated by normal skin. He tells [place] he had a fever up to 100 yesterday he is prone to UTIs but has not noted anything different. He does in and out catheterizations. The area between the first and second toes today does not look good necrotic surface covered with what looks  to be purulent drainage and erythema extending into the third toe. I had gotten this to something that I thought look better last time however it is not look good today. He also has a necrotic surface over the buttock wound which is expanded. I thought there might be infection under here so I removed a lot of the surface with a #5 curet though nothing look like it really needed culturing. He is been using Santyl to this area 12/27/17; his original wound on the left lateral leg continues to improve using Hydrofera Blue. I gave him samples of Baxdella although he was unable to take them out of fear for an allergic reaction ["lump in his throat"].the culture I did of the purulent drainage from his second toe last week showed both enterococcus and a set Enterobacter I was also concerned about the erythema on the bottom of his foot although paradoxically although this looks somewhat better today. 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 We've been using silver alginate here. They came up today that he sleeps in a recliner, I'm not sure why but I asked him to stop this 01/03/18; his original wound we've been using Hydrofera Blue is now separated into 2 areas. Ulcer on his left buttock is better he is off the recliner and sleeping in bed Finally both wound areas between his first and second toes also looks some  better 01/10/18; his original wound on the left lateral leg is now separated into 2 wounds we've been using Hydrofera Blue Ulcer on his left buttock has some drainage. There is a small probing site going into muscle layer superiorly.using silver alginate -He arrives today with a deep tissue injury on the left heel The wound on the dorsal aspect of his first second toe on the left looks a lot betterusing silver alginate ketoconazole The area on the first second toe web space on the right also looks a lot bette 01/17/18; his original wound on the left lateral leg continues to progress using Hydrofera Blue Ulcer on his left buttock also is smaller surface healthier except for a small probing site going into the muscle layer superiorly. 2.4 cm of tunneling in this area DTI on his left heel we have only been offloading. Looks better than last week no threatened open no evidence of infection the wound on the dorsal aspect of the first second toe on the left continues to look like it's regressing we have only been using silver alginate and terbinafine orally The area in the first second toe web space on the right also looks to be a lot better using silver alginate and terbinafine I think this was prompted by tinea pedis 01/31/18; the patient was hospitalized in [place] last week apparently for a complicated UTI. He was discharged on cefepime he does in and out catheterizations. In the hospital he was discovered M I don't mild elevation of AST and ALT and the terbinafine was stopped.predictably the pressure ulcer on s his buttock looks betterusing silver alginate. The area on the left lateral leg also is better using Hydrofera Blue. The area between the first and second toes on the left better. First and second toes on the right still substantial but better. Finally the DTI on the left heel has held together and looks like it's resolving 02/07/18-he is here in follow-up evaluation for multiple ulcerations.  He has new injury to the lateral aspect of  the last issue a pressure ulcer, he states this is from adhesive removal trauma. He states he has tried multiple adhesive products with no success. All other ulcers appear stable. The left heel DTI is resolving. We will continue with same treatment plan and follow-up next week. 02/14/18; follow-up for multiple areas. He has a new area last week on the lateral aspect of his pressure ulcer more over the posterior trochanter. The original pressure ulcer looks quite stable has healthy granulation. 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 Almost fully epithelialized on the original superior area using Hydrofera Blue DTI on the left heel has peeled off this week to reveal a small superficial wound under denuded skin and subcutaneous tissue Both areas between the first and second toes look better including nothing open on the left 02/21/18; The patient's wounds on his left ischial tuberosity and posterior left greater trochanter actually looked better. He has a large area of irritation around the area which I think is contact dermatitis. I am doubtful that this is fungal His original wound on the left lateral calf continues to improve we have been using Hydrofera Blue There is no open area in the left first second toe web space although there is a lot of thick callus The DTI on the left heel required debridement today of necrotic surface eschar and subcutaneous tissue using silver alginate Finally the area on the right first second toe webspace continues to contract using silver alginate and ketoconazole 02/28/18 Left ischial tuberosity wounds look better using silver alginate. Original wound on the left calf only has one small open area left using Hydrofera Blue DTI on the left heel required debridement mostly removing skin from around this wound surface. Using  silver alginate The areas on the right first/second toe web space using silver alginate and ketoconazole 03/08/18 on evaluation today patient appears to be doing decently well as best I can tell in regard to his wounds. This is the first time that I have seen him as he generally is followed by Dr. Dellia Nims. With that being said none of his wounds appear to be infected he does have an area where there is some skin covering what appears to be a new wound on the left dorsal surface of his great toe. This is right at the nail bed. With that being said I do believe that debrided away some of the excess skin can be of benefit in this regard. Otherwise he has been tolerating the dressing changes without complication. 03/14/18; patient arrives today with the multiplicity of wounds that we are following. He has not been systemically unwell Original wound on the left lateral calf now only has 2 small open areas we've been using Hydrofera Blue which should continue The deep tissue injury on the left heel requires debridement today. We've been using silver alginate The left first second toe and the right first second toe are both are reminiscence what I think was tinea pedis. Apparently some of the callus Surface between the toes was removed last week when it started draining. Purulent drainage coming from the wound on the ischial tuberosity on the left. 03/21/18-He is here in follow-up evaluation for multiple wounds. 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 He tolerated debridement. The only change to last week's recommendations is to discontinue antifungal cream between toes. He will follow-up next week  03/28/18; following up for multiple wounds;Concern this week is streaking redness and swelling in the right foot. He is going to need antibiotics for this. 03/31/18; follow-up for right foot cellulitis. Streaking redness and swelling in the  right foot on 03/28/18. 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 I put him on clindamycin 300 mg every 6 and brought him in for a quick check. He has an open wound between his first and second toes on the right foot as a potential source. 04/04/18; Right foot cellulitis is resolving he is completing clindamycin. This is truly good news Left lateral calf wound which is initial wound only has one small open area inferiorly this is close to healing out. He has compression stockings. We will use Hydrofera Blue right down to the epithelialization of this Nonviable surface on the left heel which was initially pressure with a DTI. We've been using Hydrofera Blue. I'm going to switch this back to silver alginate Left first second toe/tinea pedis this looks better using silver alginate Right first second toe tinea pedis using silver alginate Large pressure ulcers on theLeft ischial tuberosity. Small wound here Looks better. I am uncertain about the surface over the large wound. Using silver alginate 04/11/18; Cellulitis in the right foot is resolved Left lateral calf wound which was his original wounds still has 2 tiny open areas remaining this is just about closed Nonviable surface on the left heel is better but still requires debridement Left first second toe/tinea pedis still open using silver alginate Right first second toe wound tinea pedis I asked him to go back to using ketoconazole and silver alginate Large pressure ulcers on the left ischial tuberosity this shear injury here is resolved. Wound is smaller. No evidence of infection using silver alginate 04/18/18; Patient arrives with an intense area of cellulitis in the right mid lower calf extending into the right heel area. Bright red and warm. Smaller area on the left anterior leg. He has a significant history of MRSA. He will definitely need antibioticsdoxycycline He now has 2 open areas on the left ischial tuberosity the  original large wound and now a satellite area which I think was above his initial satellite areas. Not a wonderful surface on this satellite area surrounding erythema which looks like pressure related. His left lateral calf wound again his original wound is just about closed Left heel pressure injury still requiring debridement Left first second toe looks a lot better using silver alginate Right first second toe also using silver alginate and ketoconazole cream also looks better 04/20/18; the patient was worked in early today out of concerns with his cellulitis on the right leg. I had started him on doxycycline. This was 2 days ago. His wife was concerned about the swelling in the area. Also concerned about the left buttock. He has not been systemically unwell no fever chills. No nausea vomiting or diarrhea 04/25/18; the patient's left buttock wound is continued to deteriorate he is using Hydrofera Blue. He is still completing clindamycin for the cellulitis on the right leg although all of this looks better. 05/02/18 Left buttock wound still with a lot of drainage and a very tightly adherent fibrinous necrotic surface. He has a deeper area superiorly The left lateral calf wound is still closed DTI wound on the left heel necrotic surface especially the circumference using Iodoflex Areas between his left first second toe and right first second toe both look better. Dorsally and the right first second toe he  had a necrotic surface although at smaller. In using silver alginate and ketoconazole. I did a culture last week which was a deep tissue culture of the reminiscence of the open wound on the right first second toe dorsally. This grew a few Acinetobacter and a few methicillin-resistant staph aureus. Nevertheless the area actually this week looked better. I didn't feel the need to specifically address this at least in terms of systemic antibiotics. 05/09/18; wounds are measuring larger more drainage per  our intake. We are using Santyl covered with alginate on the large superficial buttock wounds, Iodosorb on the left heel, ketoconazole and silver alginate to the dorsal first and second toes bilaterally. 05/16/18; The area on his left buttock better in some aspects although the area superiorly over the ischial tuberosity required an extensive debridement.using Santyl Left heel appears stable. Using Iodoflex The areas between his first and second toes are not bad however there is spreading erythema up the dorsal aspect of his left foot this looks like cellulitis again. He is insensate the erythema is really very brilliant.o Erysipelas He went to see an allergist days ago because he was itching part of this he had lab work done. This showed a white count of 15.1 with 70% neutrophils. Hemoglobin of 11.4 and a platelet count of 659,000. Last white count we had in Epic was a 2-1/2 years ago which was 25.9 but he was ill at the time. He was able to show me some lab work that was done by his primary physician the pattern is about the same. 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 But prompted me to go ahead and do x-rays of both feet and the pelvis rule out osteomyelitis. He also had a comprehensive metabolic panel this was reasonably normal his albumin was 3.7 liver function tests BUN/creatinine all normal 05/23/18; x-rays of both his feet from last week were negative for underlying pulmonary abnormality. The x-ray of his pelvis however showed mild irregularity in the left ischial which may represent some early osteomyelitis. The wound in the left ischial continues to get deeper clearly now exposed muscle. Each week necrotic surface material over this area. Whereas the rest of the wounds do not look so bad. The left ischial wound we have been using Santyl and calcium alginate T the left heel surface necrotic debris using Iodoflex o The left lateral leg is still  healed Areas on the left dorsal foot and the right dorsal foot are about the same. There is some inflammation on the left which might represent contact dermatitis, fungal dermatitis I am doubtful cellulitis although this looks better than last week 05/30/18; CT scan done at Hospital did not show any osteomyelitis or abscess. Suggested the possibility of underlying cellulitis although I don't see a lot of evidence of this at the bedside The wound itself on the left buttock/upper thigh actually looks somewhat better. No debridement Left heel also looks better no debridement continue Iodoflex Both dorsal first second toe spaces appear better using Lotrisone. Left still required debridement 06/06/18; Intake reported some purulent looking drainage from the left gluteal wound. Using Santyl and calcium alginate Left heel looks better although still a nonviable surface requiring debridement The left dorsal foot first/second webspace actually expanding and somewhat deeper. I may consider doing a shave biopsy of this area Right dorsal foot first/second webspace appears stable to improved. Using Lotrisone and silver alginate to both these areas 06/13/18 Left gluteal surface looks  better. Now separated in the 2 wounds. No debridement required. Still drainage. We'll continue silver alginate Left heel continues to look better with Iodoflex continue this for at least another week Of his dorsal foot wounds the area on the left still has some depth although it looks better than last week. We've been using Lotrisone and silver alginate 06/20/18 Left gluteal continues to look better healthy tissue Left heel continues to look better healthy granulation wound is smaller. He is using Iodoflex and his long as this continues continue the Iodoflex Dorsal right foot looks better unfortunately dorsal left foot does not. There is swelling and erythema of his forefoot. He had minor trauma to this several days ago but doesn't  think this was enough to have caused any tissue injury. Foot looks like cellulitis, we have had this problem before 06/27/18 on evaluation today patient appears to be doing a little worse in regard to his foot ulcer. 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 Both of which would really be his only options for treating this infection. In the past he has been given and effusion of Orbactiv. This is done very well for him in the past again it's one time dosing IV antibiotic therapy. Subsequently I do believe this is something we're gonna need to see about doing at this point in time. Currently his other wounds seem to be doing somewhat better in my pinion I'm pretty happy in that regard. 07/03/18 on evaluation today patient's wounds actually appear to be doing fairly well. He has been tolerating the dressing changes without complication. All in all he seems to be showing signs of improvement. In regard to the antibiotics he has been dealing with infectious disease since I saw him last week as far as getting this scheduled. In the end [facility]'s [facility] this coming Friday. In the meantime he has been continuing to perform the dressing changes in such as previous. There does not appear to be any evidence of infection worsengin at this time. 07/10/18; Since I last saw this man 2 weeks ago things have actually improved. IV antibiotics of resulted in less forefoot erythema although there is still some present. He is not systemically unwell Left buttock wounds 2 now have no depth there is increased epithelialization Using silver alginate Left heel still requires debridement using Iodoflex Left dorsal foot still with a sizable wound about the size of a border but healthy granulation Right dorsal foot still with a  slitlike area using silver alginate 07/18/18; the patient's cellulitis in the left foot is improved in fact I think it is on its way to resolving. Left buttock wounds 2 both look better although the larger one has hypertension granulation we've been using silver alginate Left heel has some thick circumferential redundant skin over the wound edge which will need to be removed today we've been using Iodoflex Left dorsal foot is still a sizable wound required debridement using silver alginate The right dorsal foot is just about closed only a small open area remains here 07/25/18; left foot cellulitis is resolved Left buttock wounds 2 both look better. Hyper-granulation on the major area Left heel as some debris over the surface but otherwise looks a healthier wound. Using silver collagen Right dorsal foot is just about closed 07/31/18; arrives with  our intake nurse worried about purulent drainage from the buttock. We had hyper-granulation here last week His buttock wounds 2 continue to look better Left heel some debris over the surface but measuring smaller. Right dorsal foot unfortunately has openings between the toes Left foot superficial wound looks less aggravated. 08/07/18 Buttock wounds continue to look better although some of her granulation and the larger medial wound. silver alginate Left heel continues to look a lot better.silver collagen Left foot superficial wound looks less stable. Requires debridement. He has a new wound superficial area on the foot on the lateral dorsal foot. Right foot looks better using silver alginate without Lotrisone 08/14/2018; patient was in the ER last week diagnosed with a UTI. He is now on Cefpodoxime and Macrodantin. Buttock wounds continued to be smaller. Using silver alginate Left heel continues to look better using silver collagen Left foot superficial wound looks as though it is improving Right dorsal foot area is just about healed. 08/21/2018; patient is  completed his antibiotics for his UTI. He has 2 open areas on the buttocks. There is still not closed although the surface looks satisfactory. Using silver alginate Left heel continues to improve using silver collagen The bilateral dorsal foot areas which are at the base of his first and second toes/possible tinea pedis are actually stable on the left but worse on the right. The area on the left required debridement of necrotic surface. After debridement I obtained a specimen for PCR culture. The right dorsal foot which is been just about healed last week is now reopened 08/28/2018; culture done on the left dorsal foot showed coag negative staph both staph epidermidis and Lugdunensis. I think this is worthwhile initiating systemic treatment. I will use doxycycline given his long list of allergies. The area on the left heel slightly improved but still requiring debridement. The large wound on the buttock is just about closed whereas the smaller one is larger. Using silver alginate in this area 09/04/2018; patient is completing his doxycycline for the left foot although this continues to be a very difficult wound area with very adherent necrotic debris. We are using silver alginate to all his wounds right foot left foot and the small wounds on his buttock, silver collagen on the left heel. 09/11/2018; once again this patient has intense erythema and swelling of the left forefoot. Lesser degrees of erythema in the right foot. He has a long list of allergies and intolerances. I will reinstitute doxycycline. 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 Using silver alginate Left heel also looks better using silver collagen Unfortunately both the areas on his feet look worse. The area on the left first second webspace is now gone through to the plantar part of his foot. The area on the left foot anteriorly is irritated with erythema and swelling in the  forefoot. 09/25/2018 His wound on the left plantar heel looks better. Using silver collagen The area on the left buttock 2 small remnant areas. One is closed one is still open. Using silver alginate The areas between both his first and second toes look worse. This in spite of long-standing antifungal therapy with ketoconazole and silver alginate which should have antifungal activity He has small areas around his original wound on the left calf one is on the bottom of the original scar tissue and one superiorly both of these are small and superficial but again given wound history in this site this is  worrisome 10/02/2018 Left plantar heel continues to gradually contract using silver collagen Left buttock wound is unchanged using silver alginate The areas on his dorsal feet between his first and second toes bilaterally look about the same. I prescribed clindamycin ointment to see if we can address chronic staph colonization and also the underlying possibility of erythrasma The left lateral lower extremity wound is actually on the lateral part of his ankle. Small open area here. We have been using silver alginate 10/09/2018; Left plantar heel continues to look healthy and contract. No debridement is required Left buttock slightly smaller with a tape injury wound just below which was new this week Dorsal feet somewhat improved I have been using clindamycin Left lateral looks lower extremity the actual open area looks worse although a lot of this is epithelialized. I am going to change to silver collagen today He has a lot more swelling in the right leg although this is not pitting not red and not particularly warm there is a lot of spasm in the right leg usually indicative of people with paralysis of some underlying discomfort. We have reviewed his vascular status from 2017 he had a left greater saphenous vein ablation. I wonder about referring him back to vascular surgery if the area on the left  leg continues to deteriorate. 10/16/2018 in today for follow-up and management of multiple lower extremity ulcers. His left Buttock wound is much lower smaller and almost closed completely. The wound to the left ankle has began to reopen with Epithelialization and some adherent slough. He has multiple new areas to the left foot and leg. The left dorsal foot without much improvement. Wound present between left great webspace and 2nd toe. Erythema and edema present right leg. Right LE ultrasound obtained on 10/10/18 was negative for DVT . 10/23/2018; Left buttock is closed over. Still dry macerated skin but there is no open wound. I suspect this is chronic pressure/moisture Left lateral calf is quite a bit worse than when I saw this last. There is clearly drainage here he has macerated skin into the left plantar heel. We will change the primary dressing to alginate Left dorsal foot has some improvement in overall wound area. Still using clindamycin and silver alginate Right dorsal foot about the same as the left using clindamycin and silver alginate The erythema in the right leg has resolved. He is DVT rule out was negative Left heel pressure area required debridement although the wound is smaller and the surface is health 10/26/2018 The patient came back in for his nurse check today predominantly because of the drainage coming out of the left lateral leg with a recent reopening of his original wound on the left lateral calf. He comes in today with a large amount of surrounding erythema around the wound extending from the calf into the ankle and even in the area on the dorsal foot. He is not systemically unwell. He is not febrile. Nevertheless this looks like cellulitis. We have been using silver alginate to the area. I changed him to a regular visit and I am going to prescribe him doxycycline. The rationale here is a long list of medication intolerances and a history of MRSA. I did not see anything  that I thought would provide a valuable culture 10/30/2018 Follow-up from his appointment 4 days ago with really an extensive area of cellulitis in the left calf left lateral ankle and left dorsal foot. I put him on doxycycline. He has a long list of medication allergies  which are true allergy reactions. 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 In any case he is a lot better today. The erythema especially in the anterior and lateral left calf is better. He still has left ankle erythema. He also is complaining about increasing edema in the right leg we have only been using Kerlix Coban and he has been doing the wraps at home. Finally he has a spotty rash on the medial part of his upper left calf which looks like folliculitis or perhaps wrap occlusion type injury. Small superficial macules not pustules 11/06/18 patient arrives today with again a considerable degree of erythema around the wound on the left lateral calf extending into the dorsal ankle and dorsal foot. This is a lot worse than when I saw this last week. He is on doxycycline really with not a lot of improvement. He has not been systemically unwell Wounds on the; left heel actually looks improved. Original area on the left foot and proximity to the first and second toes looks about the same. He has superficial areas on the dorsal foot, anterior calf and then the reopening of his original wound on the left lateral calf which looks about the same The only area he has on the right is the dorsal webspace first and second which is smaller. He has a large area of dry erythematous skin on the left buttock small open area here. 11/13/2018; the patient arrives in much better condition. The erythema around the wound on the left lateral calf is a lot better. 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 In any case this is a lot better. The area on the left heel is very small and just about resolved using silver collagen we have been using silver alginate to the areas on his dorsal feet 11/20/2018; his wounds include the left lateral calf, left heel, dorsal aspects of both feet just proximal to the first second webspace. He is stable to slightly improved. I did not think any changes to his dressings were going to be necessary 11/27/2018 he has a reopening on the left buttock which is surrounded by what looks like tinea or perhaps some other form of dermatitis. The area on the left dorsal foot has some erythema around it I have marked this area but I am not sure whether this is cellulitis or not. Left heel is not closed. Left calf the reopening is really slightly longer and probably worse 1/13; in general things look better and smaller except for the left dorsal foot. Area on the left heel is just about closed, left buttock looks better only a small wound remains in the skin looks better [using Lotrisone] 1/20; the area on the left heel only has a few remaining open areas here. Left lateral calf about the same in terms of size, left dorsal foot slightly larger right lateral foot still not closed. The area on the left buttock has no open wound and the surrounding skin looks a lot better 1/27; the area on the left heel is closed. Left lateral calf better but still requiring extensive debridements. The area on his left buttock is closed. He still has the open areas on the left dorsal foot which is slightly smaller in the right foot which is slightly expanded. We have been using Iodoflex on these areas as well 2/3; left heel is  closed. Left lateral calf still requiring debridement using Iodoflex there is no open area on his left buttock however he has dry scaly skin over a large area of this. Not really responding well to the Lotrisone. Finally the areas on his dorsal feet at the level of the  first second webspace are slightly smaller on the right and about the same on the left. Both of these vigorously debrided with Anasept and gauze 2/10; left heel remains closed he has dry erythematous skin over the left buttock but there is no open wound here. Left lateral leg has come in and with. Still requiring debridement we have been using Iodoflex here. Finally the area on the left dorsal foot and right dorsal foot are really about the same extremely dry callused fissured areas. He does not yet have a dermatology appointment 2/17; left heel remains closed. He has a new open area on the left buttock. The area on the left lateral calf is bigger longer and still covered in necrotic debris. No major change in his foot areas bilaterally. I am awaiting for a dermatologist to look on this. We have been using ketoconazole I do not know that this is been doing any good at all. 2/24; left heel remains closed. The left buttock wound that was new reopening last week looks better. The left lateral calf appears better also although still requires debridement. The major area on his foot is the left first second also requiring debridement. We have been putting Prisma on all wounds. I do not believe that the ketoconazole has done too much good for his feet. He will use Lotrisone I am going to give him a 2-week course of terbinafine. We still do not have a dermatology appointment 3/2 left heel remains closed however there is skin over bone in this area I pointed this out to him today. The left buttock wound is epithelialized but still does not look completely stable. The area on the left leg required debridement were using silver collagen here. With regards to his feet we changed to Lotrisone last week and silver alginate. 3/9; left heel remains closed. Left buttock remains closed. The area on the right foot is essentially closed. The left foot remains unchanged. Slightly smaller on the left lateral calf. Using  silver collagen to both of these areas 3/16-Left heel remains closed. Area on right foot is closed. Left lateral calf above the lateral malleolus open wound requiring debridement with easy bleeding. Left dorsal wound proximal to first toe also debrided. Left ischial area open new. Patient has been using Prisma with wrapping every 3 days. Dermatology appointment is apparently tomorrow.Patient has completed his terbinafine 2-week course with some apparent improvement according to him, there is still flaking and dry skin in his foot on the left 3/23; area on the right foot is reopened. The area on the left anterior foot is about the same still a very necrotic adherent surface. He still has the area on the left leg and reopening is on the left buttock. He apparently saw dermatology although I do not have a note. According to the patient who is usually fairly well informed they did not have any good ideas. Put him on oral terbinafine which she is been on before. 3/30; using silver collagen to all wounds. Apparently his dermatologist put him on doxycycline and rifampin presumably some culture grew staph. I do not have this result. He remains on terbinafine although I have used terbinafine on him before 4/6; patient has had  a fairly substantial reopening on the right foot between the first and second toes. He is finished his terbinafine and I believe is on doxycycline and rifampin still as prescribed by dermatology. We have been using silver collagen to all his wounds although the patient reports that he thinks silver alginate does better on the wounds on his buttock. 4/13; the area on his left lateral calf about the same size but it did not require debridement. Left dorsal foot just proximal to the webspace between the first and second toes is about the same. Still nonviable surface. I note some superficial bronze discoloration of the dorsal part of his foot Right dorsal foot just proximal to the first  and second toes also looks about the same. I still think there may be the same discoloration I noted above on the left Left buttock wound looks about the same 4/20; left lateral calf appears to be gradually contracting using silver collagen. 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 The left dorsal foot wound is debrided of tightly adherent necrotic debris and really cleans up quite nicely. The right area is worse with expansion. I did not debride this it is now over the base of the second toe The area on his left buttock is smaller no debridement is required using silver collagen 5/4; left calf continues to make good progress. He arrives with erythema around the wounds on his dorsal foot which even extends to the plantar aspect. Very concerning for coexistent infection. He is finished the erythromycin I gave him for possible erythrasma this does not seem to have helped. The area on the left foot is about the same base of the dorsal toes Is area on the buttock looks improved on the left 5/11; left calf and left buttock continued to make good progress. Left foot is about the same to slightly improved. Major problem is on the right foot. He has not had an x-ray. Deep tissue culture I did last week showed both Enterobacter and E. coli. I did not change the doxycycline I put him on empirically although neither 1 of these were plated to doxycycline. He arrives today with the erythema looking worse on both the dorsal and plantar foot. Macerated skin on the bottom of the foot. he has not been systemically unwell 5/18-Patient returns at 1 week, left calf wound appears to be making some progress, left buttock wound appears slightly worse than last time, left foot wound looks slightly better, right foot redness is marginally better. X-ray of both feet show no air or evidence of osteomyelitis. Patient is finished his Omnicef and terbinafine. He  continues to have macerated skin on the bottom of the left foot as well as right 5/26; left calf wound is better, left buttock wound appears to have multiple small superficial open areas with surrounding macerated skin. X-rays that I did last time showed no evidence of osteomyelitis in either foot. He is finished cefdinir and doxycycline. I do not think that he was on terbinafine. He continues to have a large superficial open area on the right foot anterior dorsal and slightly between the first and second toes. I did send him to dermatology 2 months ago or so wondering about whether they would do a fungal scraping. I do not believe they did but did do a culture. We have been using silver alginate to the toe areas, he has been using antifungals at home topically either ketoconazole or Lotrisone. We are using silver  collagen on the left foot, silver alginate on the right, silver collagen on the left lateral leg and silver alginate on the left buttock 6/1; left buttock area is healed. We have the left dorsal foot, left lateral leg and right dorsal foot. We are using silver alginate to the areas on both feet and silver collagen to the area on his left lateral calf 6/8; the left buttock apparently reopened late last week. He is not really sure how this happened. He is tolerating the terbinafine. Using silver alginate to all wounds 6/15; left buttock wound is larger than last week but still superficial. Came in the clinic today with a report of purulence from the left lateral leg I did not identify any infection Both areas on his dorsal feet appear to be better. He is tolerating the terbinafine. Using silver alginate to all wounds 6/22; left buttock is about the same this week, left calf quite a bit better. His left foot is about the same however he comes in with erythema and warmth in the right forefoot once again. Culture that I gave him in the beginning of May showed Enterobacter and E. coli. I gave him  doxycycline and things seem to improve although neither 1 of these organisms was specifically plated. 6/29; left buttock is larger and dry this week. Left lateral calf looks to me to be improved. Left dorsal foot also somewhat improved right foot completely unchanged. The erythema on the right foot is still present. He is completing the Ceftin dinner that I gave him empirically [see discussion above.) 7/6 - All wounds look to be stable and perhaps improved, the left buttock wound is slightly smaller, per patient bleeds easily, completed ceftin, the right foot redness is less, he is on terbinafine 7/13; left buttock wound about the same perhaps slightly narrower. Area on the left lateral leg continues to narrow. Left dorsal foot slightly smaller right foot about the same. We are using silver alginate on the right foot and Hydrofera Blue to the areas on the left. Unna boot on the left 2 layer compression on the right 7/20; left buttock wound absolutely the same. Area on lateral leg continues to get better. Left dorsal foot require debridement as did the right no major change in the 7/27; left buttock wound the same size necrotic debris over the surface. The area on the lateral leg is closed once again. His left foot looks better right foot about the same although there is some involvement now of the posterior first second toe area. He is still on terbinafine which I have given him for a month, not certain a centimeter major change 06/25/19-All wounds appear to be slightly improved according to report, left buttock wound looks clean, both foot wounds have minimal to no debris the right dorsal foot has minimal slough. We are using Hydrofera Blue to the left and silver alginate to the right foot and ischial wound. 8/10-Wounds all appear to be around the same, the right forefoot distal part has some redness which was not there before, however the wound looks clean and small. Ischial wound looks about the  same with no changes 8/17; his wound on the left lateral calf which was his original chronic venous insufficiency wound remains closed. Since I last saw him the areas on the left dorsal foot right dorsal foot generally appear better but require debridement. The area on his left initial tuberosity appears somewhat larger to me perhaps hyper granulated and bleeds very easily. We have been  using Hydrofera Blue to the left dorsal foot and silver alginate to everything else 8/24; left lateral calf remains closed. The areas on his dorsal feet on the webspace of the first and second toes bilaterally both look better. The area on the left buttock which is the pressure ulcer stage II slightly smaller. I change the dressing to Hydrofera Blue to all areas 8/31; left lateral calf remains closed. The area on his dorsal feet bilaterally look better. Using Hydrofera Blue. Still requiring debridement on the left foot. No change in the left buttock pressure ulcers however 9/14; left lateral calf remains closed. Dorsal feet look quite a bit better than 2 weeks ago. Flaking dry skin also a lot better with the ammonium lactate I gave him 2 weeks ago. The area on the left buttock is improved. He states that his Roho cushion developed a leak and he is getting a new one, in the interim he is offloading this vigorously 9/21; left calf remains closed. Left heel which was a possible DTI looks better this week. He had macerated tissue around the left dorsal foot right foot looks satisfactory and improved left buttock wound. I changed his dressings to his feet to silver alginate bilaterally. Continuing Hydrofera Blue on the left buttock. 9/28 left calf remains closed. Left heel did not develop anything [possible DTI] dry flaking skin on the left dorsal foot. Right foot looks satisfactory. Improved left buttock wound. We are using silver alginate on his feet Hydrofera Blue on the buttock. I have asked him to go back to the  Lotrisone on his feet including the wounds and surrounding areas 10/5; left calf remains closed. The areas on the left and right feet about the same. A lot of this is epithelialized however debris over the remaining open areas. He is using Lotrisone and silver alginate. The area on the left buttock using Hydrofera Blue 10/26. Patient has been out for 3 weeks secondary to Covid concerns. He tested negative but I think his wife tested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 necrotic eschar and slough on the lateral aspect of his left leg in 2 areas and also raised area over the left Achilles. We put Santyl on this last week and left him in a rapid. He says the drainage went through. He has some Kerlix Coban and in some Profore at home I have therefore written him a prescription for Santyl and he can change this at home on his own. 01/23/16; the original 2 wounds on the right leg are apparently still closed. He continues to have a deep wound on his left lateral leg in 2 spots the superior one much larger than the inferior one. He also  has a raised area on the left Achilles. We have been putting Santyl and all of these wounds. His wife is changing this at home one time this week although she may be able to do this more frequently. 01/30/16 no open wounds on the right leg. He continues to have a deep wound on the left lateral leg in 2 spots and a smaller wound over the left Achilles area. [name] This debridement is with great difficulty. [name] 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 In spite of that his leg actually looks somewhat better. There is still copious drainage and some erythema but it is generally better. The oral options that were obvious including Zyvox and sulfonamides he has rash issues both of these. This is sensitive to rifampin but this is not usually used along gentamicin but this is parenteral and again not used along. The obvious alternative is vancomycin. He has had his arterial studies. [name] T brachial index was 1.3 oe on the right. His waveforms were biphasic bilaterally. Doppler waveforms of the digit were normal in the right damp and on the left. Comment that this could've been due to extreme edema. His venous studies show reflux on both sides in the femoral popliteal veins as well as the greater and lesser saphenous veins bilaterally. Ultimately he is going to need to see vascular surgery about this issue. Hopefully when we can get his wounds and a little better shape. 02/19/16; the patient was able to complete a course of Delavan's for MRSA in the face of multiple antibiotic  allergies. Arterial studies showed an ABI of him 0.88 on the right 1.17 on the left the.  Waveforms were biphasic at the posterior tibial and dorsalis pedis digital waveforms were normal. Right toe brachial index was 1.3 limited by shaking and edema. His venous study showed widespread reflux in the left at the common femoral vein the greater and lesser saphenous vein the greater and lesser saphenous vein on the right as well as the popliteal and femoral vein. The popliteal and femoral vein on the left did not show reflux. His wounds on the right leg give healed on the left he is still using Santyl. 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 The erythema has not really resolved and I wonder if this is mostly venous inflammation rather than cellulitis. Still using Santyl. He is approved for Apligraf 03/04/16; there is less erythema around the wound. Both wounds require aggressive surgical debridement. Not yet ready for Apligraf 03/11/16; aggressive debridement again. Not ready for Apligraf 03/18/16 aggressive debridement again. 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 Has been to see vascular surgery he is being planned for a venous ablation 03/25/16; aggressive debridement again of both wound areas on the left lateral leg. He is due for ablation surgery on May 22. He is much closer to being ready for an Apligraf. Has a new area between the left first and second toes 04/01/16 aggressive debridement done of both wounds. The new wound at the base of between his second and first toes looks stable 04/08/16; continued aggressive debridement of both wounds on the left lower leg. He goes for his venous ablation on Monday. The new wound at the base of his first and second toes dorsally appears stable. 04/15/16; wounds aggressively debridement although the base of this looks considerably better Apligraf #1. He had ablation surgery on Monday I'll need to research these records. We only have approval for four Apligraf's 04/22/16; the patient is  here for a wound check [Apligraf last week] intake nurse concerned about erythema around the wounds. Apparently a significant degree of drainage. The patient has chronic venous inflammation which I think accounts for most of this however I was asked to look at this today 04/26/16; the patient came back for check of possible cellulitis in his left foot however the Apligraf dressing was inadvertently removed therefore we elected to prep the wound for a second Apligraf. I put him on doxycycline on 6/1 the erythema in the foot 05/03/16 we did not remove the dressing from the superior wound as this is where I put all of his last Apligraf. Surface debridement done with a curette of the lower wound which looks very healthy. The area on the left foot also looks quite satisfactory at the dorsal artery at the first and second toes 05/10/16; continue Apligraf to this. Her wound, Hydrafera to the lower wound. He has a new area on the right second toe. Left dorsal foot firstoosecond toe also looks improved 05/24/16; wound dimensions must be smaller I was able to use Apligraf to all 3 remaining wound areas. 06/07/16 patient's last Apligraf was 2 weeks ago. He arrives today with the 2 wounds on his lateral left leg joined together. This would have to be seen as a negative. He also has a small wound in his first and second toe on the left dorsally with quite a bit of surrounding erythema in the first second and third toes. This looks to be infected or inflamed,  very difficult clinical call. 06/21/16: lateral left leg combined wounds. Adherent surface slough area on the left dorsal foot at roughly the fourth toe looks improved 07/12/16; he now has a single linear wound on the lateral left leg. This does not look to be a lot changed from when I lost saw this. The area on his dorsal left foot looks considerably better however. 08/02/16; no major change in the substantial area on his left lateral leg since last time. We have been  using Hydrofera Blue for a prolonged period of time now. The area on his left foot is also unchanged from last review 07/19/16; the area on his dorsal foot on the left looks considerably smaller. He is beginning to have significant rims of epithelialization on the lateral left leg wound. This also looks better. 08/05/16; the patient came in for a nurse visit today. Apparently the area on his left lateral leg looks better and it was wrapped. However in general discussion the patient noted a new area on the dorsal aspect of his right second toe. The exact etiology of this is unclear but likely relates to pressure. 08/09/16 really the area on the left lateral leg did not really look that healthy today perhaps slightly larger and measurements. The area on his dorsal right second toe is improved also the left foot wound looks stable to improved 08/16/16; the area on the last lateral leg did not change any of dimensions. Post debridement with a curet the area looked better. Left foot wound improved and the area on the dorsal right second toe is improved 08/23/16; the area on the left lateral leg may be slightly smaller both in terms of length and width. Aggressive debridement with a curette afterwards the tissue appears healthier. Left foot wound appears improved in the area on the dorsal right second toe is improved 08/30/16 patient developed a fever over the weekend and was seen in an urgent care. Felt to have a UTI and put on doxycycline. He has been since changed over the phone to [facility]. After we took off the wrap on his right leg today the leg is swollen warm and erythematous, probably more likely the source of the fever 09/06/16; have been using collagen to the major left leg wound, silver alginate to the area on his anterior foot/toes 09/13/16; the areas on his anterior foot/toes on both sides appear to be virtually closed. Extensive wound on the left lateral leg perhaps slightly narrower but each  visit still covered an adherent surface slough 09/16/16 patient was in for his usual Thursday nurse visit however the intake nurse noted significant erythema of his dorsal right foot. He is also running a low- grade fever and having increasing spasms in the right leg 09/20/16 here for cellulitis involving his right great toes and forefoot. This is a lot better. Still requiring debridement on his left lateral leg. Santyl direct says he needs prior authorization. Therefore his wife cannot change this at home 09/30/16; the patient's extensive area on the left lateral calf and ankle perhaps somewhat better. Using Santyl. The area on the left toes is healed and I think the area on his right dorsal foot is healed as well. There is no cellulitis or venous inflammation involving the right leg. He is going to need compression stockings here. 10/07/16; the patient's extensive wound on the left lateral calf and ankle does not measure any differently however there appears to be less adherent surface slough using Santyl and aggressive weekly debridements 10/21/16;  no major change in the area on the left lateral calf. Still the same measurement still very difficult to debridement adherent slough and nonviable subcutaneous tissue. This is not really been helped by several weeks of Santyl. Previously for 2 weeks I used Iodoflex for a short period. A prolonged course of Hydrofera Blue didn't really help. I'm not sure why I only used 2 weeks of Iodoflex on this there is no evidence of surrounding infection. He has a small area on the right second toe which looks as though it's progressing towards closure 10/28/16; the wounds on his toes appear to be closed. No major change in the left lateral leg wound although the surface looks somewhat better using Iodoflex. He has had previous arterial studies that were normal. He has had reflux studies and is status post ablation although I don't have any exact notes on which vein  was ablated. I'll need to check the surgical record 11/04/16; he's had a reopening between the first and second toe on the left and right. No major change in the left lateral leg wound. There is what appears to be cellulitis of the left dorsal foot 11/18/16 the patient was hospitalized initially in [place] and then subsequently transferred to [facility] long and was admitted there from 11/09/16 through 11/12/16. He had developed progressive cellulitis on the right leg in spite of the doxycycline I gave him. I'd spoken to the hospitalist in [place] who was concerned about continuing leukocytosis. CT scan is what I suggested this was done which showed soft tissue swelling without evidence of osteomyelitis or an underlying abscess blood cultures were negative. At [facility] he was treated with vancomycin and Primaxin and then add an infectious disease consult. He was transitioned to Ceftaroline. He has been making progressive improvement. Overall a severe cellulitis of the right leg. He is been using silver alginate to her original wound on the left leg. The wounds in his toes on the right are closed there is a small open area on the base of the left second toe 11/26/15; the patient's right leg is much better although there is still some edema here this could be reminiscent from his severe cellulitis likely on top of some degree of lymphedema. His left anterior leg wound has less surface slough as reported by her intake nurse. Small wound at the base of the left second toe 12/02/16; patient's right leg is better and there is no open wound here. His left anterior lateral leg wound continues to have a healthy-looking surface. Small wound at the base of the left second toe however there is erythema in the left forefoot which is worrisome 12/16/16; is no open wounds on his right leg. We took measurements for stockings. His left anterior lateral leg wound continues to have a healthy-looking surface. I'm not sure  where we were with the Apligraf run through his insurance. We have been using Iodoflex. He has a thick eschar on the left first second toe interface, I suspect this may be fungal however there is no visible open 12/23/16; no open wound on his right leg. He has 2 small areas left of the linear wound that was remaining last week. We have been using Prisma, I thought I have disclosed this week, we can only look forward to next week 01/03/17; the patient had concerning areas of erythema last week, already on doxycycline for UTI through his primary doctor. The erythema is absolutely no better there is warmth and swelling both medially from the left  lateral leg wound and also the dorsal left foot. 01/06/17- Patient is here for follow-up evaluation of his left lateral leg ulcer and bilateral feet ulcers. He is on oral antibiotic therapy, tolerating that. Nursing staff and the patient states that the erythema is improved from Monday. 01/13/17; the predominant left lateral leg wound continues to be problematic. I had put Apligraf on him earlier this month once. However he subsequently developed what appeared to be an intense cellulitis around the left lateral leg wound. I gave him Dalvance I think on 2/12 perhaps 2/13 he continues on cefdinir. The erythema is still present but the warmth and swelling is improved. I am hopeful that the cellulitis part of this control. I wouldn't be surprised if there is an element of venous inflammation as well. 01/17/17. The erythema is present but better in the left leg. His left lateral leg wound still does not have a viable surface buttons certain parts of this long thin wound it appears like there has been improvement in dimensions. 01/20/17; the erythema still present but much better in the left leg. I'm thinking this is his usual degree of chronic venous inflammation. The wound on the left leg looks somewhat better. Is less surface slough 01/27/17; erythema is back to the chronic  venous inflammation. The wound on the left leg is somewhat better. I am back to the point where I like to try an Apligraf once again 02/10/17; slight improvement in wound dimensions. Apligraf #2. He is completing his doxycycline 02/14/17; patient arrives today having completed doxycycline last Thursday. This was supposed to be a nurse visit however once again he hasn't tense erythema from the medial part of his wound extending over the lower leg. Also erythema in his foot this is roughly in the same distribution as last time. He has baseline chronic venous inflammation however this is a lot worse than the baseline I have learned to accept the on him is baseline inflammation 02/24/17- patient is here for follow-up evaluation. He is tolerating compression therapy. His voicing no complaints or concerns he is here anticipating an Apligraf 03/03/17; he arrives today with an adherent necrotic surface. I don't think this is surface is going to be amenable for Apligraf's. The erythema around his wound and on the left dorsal foot has resolved he is off antibiotics 03/10/17; better-looking surface today. I don't think he can tolerate Apligraf's. He tells me he had a wound VAC after a skin graft years ago to this area and they had difficulty with a seal. The erythema continues to be stable around this some degree of chronic venous inflammation but he also has recurrent cellulitis. We have been using Iodoflex 03/17/17; continued improvement in the surface and may be small changes in dimensions. Using Iodoflex which seems the only thing that will control his surface 03/24/17- He is here for follow up evaluation of his LLE lateral ulceration and ulcer to right dorsal foot/toe space. He is voicing no complaints or concerns, He is tolerating compression wrap. 03/31/17 arrives today with a much healthier looking wound on the left lower extremity. We have been using Iodoflex for a prolonged period of time which has for the  first time prepared and adequate looking wound bed although we have not had much in the way of wound dimension improvement. He also has a small wound between the first and second toe on the right 04/07/17; arrives today with a healthy-looking wound bed and at least the top 50% of this wound appears to  be now her. No debridement was required I have changed him to [facility] last week after prolonged Iodoflex. He did not do well with Apligraf's. We've had a re-opening between the first and second toe on the right 04/14/17; arrives today with a healthier looking wound bed contractions and the top 50% of this wound and some on the lesser 50%. Wound bed appears healthy. The area between the first and second toe on the right still remains problematic 04/21/17; continued very gradual improvement. Using [facility] 04/28/17; continued very gradual improvement in the left lateral leg venous insufficiency wound. His periwound erythema is very mild. We have been using Hydrofera Blue. Wound is making progress especially in the superior 50% 05/05/17; he continues to have very gradual improvement in the left lateral venous insufficiency wound. Both in terms with an length rings are improving. I debrided this every 2 weeks with #5 curet and we have been using Hydrofera Blue and again making good progress With regards to the wounds between his right first and second toe which I thought might of been tinea pedis he is not making as much progress very dry scaly skin over the area. Also the area at the base of the left first and second toe in a similar condition 05/12/17; continued gradual improvement in the refractory left lateral venous insufficiency wound on the left. Dimension smaller. Surface still requiring debridement using Hydrofera Blue 05/19/17; continued gradual improvement in the refractory left lateral venous ulceration. Careful inspection of the wound bed underlying rumination suggested some degree of  epithelialization over the surface no debridement indicated. Continue Hydrofera Blue difficult areas between his toes first and third on the left than first and second on the right. I'm going to change to silver alginate from silver collagen. Continue ketoconazole as I suspect underlying tinea pedis 05/26/17; left lateral leg venous insufficiency wound. We've been using Hydrofera Blue. I believe that there is expanding epithelialization over the surface of the wound albeit not coming from the wound circumference. This is a bit of an odd situation in which the epithelialization seems to be coming from the surface of the wound rather than in the exact circumference. There is still small open areas mostly along the lateral margin of the wound. ooHe has unchanged areas between the left first and second and the right first second toes which I been treating for tenia pedis 06/02/17; left lateral leg venous insufficiency wound. We have been using Hydrofera Blue. Somewhat smaller from the wound circumference. The surface of the wound remains a bit on it almost epithelialized sedation in appearance. I use an open curette today debridement in the surface of all of this especially the edges ooSmall open wounds remaining on the dorsal right first and second toe interspace and the plantar left first second toe and her face on the left 06/09/17; wound on the left lateral leg continues to be smaller but very gradual and very dry surface using Hydrofera Blue 06/16/17 requires weekly debridements now on the left lateral leg although this continues to contract. I changed to silver collagen last week because of dryness of the wound bed. Using Iodoflex to the areas on his first and second toes/web space bilaterally 06/24/17; patient with history of paraplegia also chronic venous insufficiency with lymphedema. Has a very difficult wound on the left lateral leg. This has been gradually reducing in terms of with but comes in with  a very dry adherent surface. High switch to silver collagen a week or so ago  with hydrogel to keep the area moist. This is been refractory to multiple dressing attempts. He also has areas in his first and second toes bilaterally in the anterior and posterior web space. I had been using Iodoflex here after a prolonged course of silver alginate with ketoconazole was ineffective [question tinea pedis] 07/14/17; patient arrives today with a very difficult adherent material over his left lateral lower leg wound. He also has surrounding erythema and poorly controlled edema. He was switched his Santyl last visit which the nurses are applying once during his doctor visit and once on a nurse visit. He was also reduced to 2 layer compression I'm not exactly sure of the issue here. 07/21/17; better surface today after 1 week of Iodoflex. Significant cellulitis that we treated last week also better. [Doxycycline] 07/28/17 better surface today with now 2 weeks of Iodoflex. Significant cellulitis treated with doxycycline. He has now completed the doxycycline and he is back to his usual degree of chronic venous inflammation/stasis dermatitis. He reminds me he has had ablations surgery here 08/04/17; continued improvement with Iodoflex to the left lateral leg wound in terms of the surface of the wound although the dimensions are better. 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 Problematic areas on the plantar aspect of the first second toe web space on the left and the dorsal aspect of the first second toe web space on the right. At one point I felt these were probably related to chronic fungal infections in treated him aggressively for this although we have not made any improvement here. 08/11/17; left lateral leg. Surface continues to improve with the Iodoflex although we are not seeing much improvement in overall wound dimensions. Areas on his  plantar left foot and right foot show no improvement. In fact the right foot looks somewhat worse 08/18/17; left lateral leg. We changed to [facility] Blue last week after a prolonged course of Iodoflex which helps get the surface better. It appears that the wound with is improved. Continue with difficult areas on the left dorsal first second and plantar first second on the right 09/01/17; patient arrives in clinic today having had a temperature of 103 yesterday. He was seen in the ER and [facility]. The patient was concerned he could have cellulitis again in the right leg however they diagnosed him with a UTI and he is now on Keflex. He has a history of cellulitis which is been recurrent and difficult but this is been in the left leg, in the past 5 use doxycycline. He does in and out catheterizations at home which are risk factors for UTI 09/08/17; patient will be completing his Keflex this weekend. The erythema on the left leg is considerably better. He has a new wound today on the medial part of the right leg small superficial almost looks like a skin tear. He has worsening of the area on the right dorsal first and second toe. His major area on the left lateral leg is better. Using Hydrofera Blue on all areas 09/15/17; gradual reduction in width on the long wound in the left lateral leg. No debridement required. He also has wounds on the plantar aspect of his left first second toe web space and on the dorsal aspect of the right first second toe web space. 09/22/17; there continues to be very gradual improvements in the dimensions of the left lateral leg wound. He hasn't round erythematous spot with might be pressure on his  wheelchair. There is no evidence obviously of infection no purulence no warmth ooHe has a dry scaled area on the plantar aspect of the left first second toe ooImproved area on the dorsal right first second toe. 09/29/17; left lateral leg wound continues to improve in dimensions  mostly with an is still a fairly long but increasingly narrow wound. ooHe has a dry scaled area on the plantar aspect of his left first second toe web space ooIncreasingly concerning area on the dorsal right first second toe. In fact I am concerned today about possible cellulitis around this wound. The areas extending up his second toe and although there is deformities here almost appears to abut on the nailbed. 10/06/17; left lateral leg wound continues to make very gradual progress. Tissue culture I did from the right first second toe dorsal foot last time grew MRSA and enterococcus which was vancomycin sensitive. This was not sensitive to clindamycin or doxycycline. He is allergic to Zyvox and sulfa we have therefore arrange for him to have dalvance infusion tomorrow. He is had this in the past and tolerated it well 10/20/17; left lateral leg wound continues to make decent progress. This is certainly reduced in terms of with there is advancing epithelialization.ooThe cellulitis in the right foot looks better although he still has a deep wound in the dorsal aspect of the first second toe web space. Plantar left first toe web space on the left I think is making some progress 10/27/17; left lateral leg wound continues to make decent progress. Advancing epithelialization.using Hydrofera Blue ooThe right first second toe web space wound is better-looking using silver alginate ooImprovement in the left plantar first second toe web space. Again using silver alginate 11/03/17 left lateral leg wound continues to make decent progress albeit slowly. Using Hydrofera Blue ooThe right per second toe web space continues to be a very problematic looking punched out wound. I obtained a piece of tissue for deep culture I did extensively treated this for fungus. It is difficult to imagine that this is a pressure area as the patient states other than going outside he doesn't really wear shoes at home ooThe  left plantar first second toe web space looked fairly senescent. Necrotic edges. This required debridement oochange to Hydrofera Blue to all wound areas 11/10/17; left lateral leg wound continues to contract. Using Hydrofera Blue ooOn the right dorsal first second toe web space dorsally. Culture I did of this area last week grew MRSA there is not an easy oral option in this patient was multiple antibiotic allergies or intolerances. This was only a rare culture isolate I'm therefore going to use Bactroban under silver alginate ooOn the left plantar first second toe web space. Debridement is required here. This is also unchanged 11/17/17; left lateral leg wound continues to contract using Hydrofera Blue this is no longer the major issue. ooThe major concern here is the right first second toe web space. He now has an open area going from dorsally to the plantar aspect. There is now wound on the inner lateral part of the first toe. Not a very viable surface on this. There is erythema spreading medially into the forefoot. ooNo major change in the left first second toe plantar wound 11/24/17; left lateral leg wound continues to contract using Hydrofera Blue. Nice improvement today ooThe right first second toe web space all of this looks a lot less angry than last week. I have given him clindamycin and topical Bactroban for MRSA and terbinafine for the  possibility of underlining tinea pedis that I could not control with ketoconazole. Looks somewhat better ooThe area on the plantar left first second toe web space is weeping with dried debris around the wound 12/01/17; left lateral leg wound continues to contract he Hydrofera Blue. It is becoming thinner in terms of with nevertheless it is making good improvement. ooThe right first second toe web space looks less angry but still a large necrotic-looking wounds starting on the plantar aspect of the right foot extending between the toes and now extensively  on the base of the right second toe. I gave him clindamycin and topical Bactroban for MRSA anterior benefiting for the possibility of underlying tinea pedis. Not looking better today ooThe area on the left first/second toe looks better. Debrided of necrotic debris 12/05/17* the patient was worked in urgently today because over the weekend he found blood on his incontinence bad when he woke up. He was found to have an ulcer by his wife who does most of his wound care. He came in today for [place] to look at this. He has not had a history of wounds in his buttocks in spite of his paraplegia. 12/08/17; seen in follow-up today at his usual appointment. He was seen earlier this week and found to have a new wound on his buttock. We also follow him for wounds on the left lateral leg, left first second toe web space and right first second toe web space 12/15/17; we have been using Hydrofera Blue to the left lateral leg which has improved. The right first second toe web space has also improved. Left first second toe web space plantar aspect looks stable. The left buttock has worsened using Santyl. Apparently the buttock has drainage 12/22/17; we have been using Hydrofera Blue to the left lateral leg which continues to improve now 2 small wounds separated by normal skin. He tells [place] he had a fever up to 100 yesterday he is prone to UTIs but has not noted anything different. He does in and out catheterizations. The area between the first and second toes today does not look good necrotic surface covered with what looks to be purulent drainage and erythema extending into the third toe. I had gotten this to something that I thought look better last time however it is not look good today. He also has a necrotic surface over the buttock wound which is expanded. I thought there might be infection under here so I removed a lot of the surface with a #5 curet though nothing look like it really needed culturing. He is been using  Santyl to this area 12/27/17; his original wound on the left lateral leg continues to improve using Hydrofera Blue. I gave him samples of Baxdella although he was unable to take them out of fear for an allergic reaction ["lump in his throat"].the culture I did of the purulent drainage from his second toe last week showed both enterococcus and a set Enterobacter I was also concerned about the erythema on the bottom of his foot although paradoxically although this looks somewhat better today. 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 We've been using silver alginate here. They came up today that he sleeps in a recliner, I'm not sure why but I asked him to stop this 01/03/18; his original wound we've been using Hydrofera Blue is now separated into 2 areas. ooUlcer on his left buttock is better  he is off the recliner and sleeping in bed ooFinally both wound areas between his first and second toes also looks some better 01/10/18; his original wound on the left lateral leg is now separated into 2 wounds we've been using Hydrofera Blue ooUlcer on his left buttock has some drainage. There is a small probing site going into muscle layer superiorly.using silver alginate -He arrives today with a deep tissue injury on the left heel ooThe wound on the dorsal aspect of his first second toe on the left looks a lot betterusing silver alginate ketoconazole ooThe area on the first second toe web space on the right also looks a lot bette 01/17/18; his original wound on the left lateral leg continues to progress using Hydrofera Blue ooUlcer on his left buttock also is smaller surface healthier except for a small probing site going into the muscle layer superiorly. 2.4 cm of tunneling in this area ooDTI on his left heel we have only been offloading. Looks better than last week no threatened open no evidence of  infection oothe wound on the dorsal aspect of the first second toe on the left continues to look like it's regressing we have only been using silver alginate and terbinafine orally ooThe area in the first second toe web space on the right also looks to be a lot better using silver alginate and terbinafine I think this was prompted by tinea pedis 01/31/18; the patient was hospitalized in [place] last week apparently for a complicated UTI. He was discharged on cefepime he does in and out catheterizations. In the hospital he was discovered M I don't mild elevation of AST and ALT and the terbinafine was stopped.predictably the pressure ulcer on s his buttock looks betterusing silver alginate. The area on the left lateral leg also is better using Hydrofera Blue. The area between the first and second toes on the left better. First and second toes on the right still substantial but better. Finally the DTI on the left heel has held together and looks like it's resolving 02/07/18-he is here in follow-up evaluation for multiple ulcerations. He has new injury to the lateral aspect of the last issue a pressure ulcer, he states this is from adhesive removal trauma. He states he has tried multiple adhesive products with no success. All other ulcers appear stable. The left heel DTI is resolving. We will continue with same treatment plan and follow-up next week. 02/14/18; follow-up for multiple areas. ooHe has a new area last week on the lateral aspect of his pressure ulcer more over the posterior trochanter. The original pressure ulcer looks quite stable has healthy granulation. 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 Almost fully epithelialized on the original superior area using Hydrofera Blue ooDTI on the left heel has peeled off this week to reveal a small superficial wound under denuded skin and  subcutaneous tissue ooBoth areas between the first and second toes look better including nothing open on the left 02/21/18; ooThe patient's wounds on his left ischial tuberosity and posterior left greater trochanter actually looked better. He has a large area of irritation around the area which I think is contact dermatitis. I am doubtful that this is fungal ooHis original wound on the left lateral calf continues to improve we have been using Hydrofera Blue ooThere is no open area in the left first second toe web space although there is a lot of thick  callus ooThe DTI on the left heel required debridement today of necrotic surface eschar and subcutaneous tissue using silver alginate ooFinally the area on the right first second toe webspace continues to contract using silver alginate and ketoconazole 02/28/18 ooLeft ischial tuberosity wounds look better using silver alginate. ooOriginal wound on the left calf only has one small open area left using Hydrofera Blue ooDTI on the left heel required debridement mostly removing skin from around this wound surface. Using silver alginate ooThe areas on the right first/second toe web space using silver alginate and ketoconazole 03/08/18 on evaluation today patient appears to be doing decently well as best I can tell in regard to his wounds. This is the first time that I have seen him as he generally is followed by Dr. Dellia Nims. With that being said none of his wounds appear to be infected he does have an area where there is some skin covering what appears to be a new wound on the left dorsal surface of his great toe. This is right at the nail bed. With that being said I do believe that debrided away some of the excess skin can be of benefit in this regard. Otherwise he has been tolerating the dressing changes without complication. 03/14/18; patient arrives today with the multiplicity of wounds that we are following. He has not been systemically  unwell ooOriginal wound on the left lateral calf now only has 2 small open areas we've been using Hydrofera Blue which should continue ooThe deep tissue injury on the left heel requires debridement today. We've been using silver alginate ooThe left first second toe and the right first second toe are both are reminiscence what I think was tinea pedis. Apparently some of the callus Surface between the toes was removed last week when it started draining. ooPurulent drainage coming from the wound on the ischial tuberosity on the left. 03/21/18-He is here in follow-up evaluation for multiple wounds. 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 He tolerated debridement. The only change to last week's recommendations is to discontinue antifungal cream between toes. He will follow-up next week 03/28/18; following up for multiple wounds;Concern this week is streaking redness and swelling in the right foot. He is going to need antibiotics for this. 03/31/18; follow-up for right foot cellulitis. Streaking redness and swelling in the right foot on 03/28/18. 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 I put him on clindamycin 300 mg every 6 and brought him in for a quick check. He has an open wound between his first and second toes on the right foot as a potential source. 04/04/18; ooRight foot cellulitis is resolving he is completing clindamycin. This is truly good news ooLeft lateral calf wound which is initial wound only has one small open area inferiorly this is close to healing out. He has compression stockings. We will use Hydrofera Blue right down to the epithelialization of this ooNonviable surface on the left heel which was initially pressure with a DTI. We've been using Hydrofera Blue. I'm going to switch this back to silver alginate ooLeft first second toe/tinea pedis this looks better using silver  alginate ooRight first second toe tinea pedis using silver alginate ooLarge pressure ulcers on theLeft ischial tuberosity. Small wound here Looks better. I am uncertain about the surface over the large wound. Using silver alginate 04/11/18; ooCellulitis in the right foot is resolved ooLeft lateral calf wound which was his original wounds still has  2 tiny open areas remaining this is just about closed ooNonviable surface on the left heel is better but still requires debridement ooLeft first second toe/tinea pedis still open using silver alginate ooRight first second toe wound tinea pedis I asked him to go back to using ketoconazole and silver alginate ooLarge pressure ulcers on the left ischial tuberosity this shear injury here is resolved. Wound is smaller. No evidence of infection using silver alginate 04/18/18; ooPatient arrives with an intense area of cellulitis in the right mid lower calf extending into the right heel area. Bright red and warm. Smaller area on the left anterior leg. He has a significant history of MRSA. He will definitely need antibioticsoodoxycycline ooHe now has 2 open areas on the left ischial tuberosity the original large wound and now a satellite area which I think was above his initial satellite areas. Not a wonderful surface on this satellite area surrounding erythema which looks like pressure related. ooHis left lateral calf wound again his original wound is just about closed ooLeft heel pressure injury still requiring debridement ooLeft first second toe looks a lot better using silver alginate ooRight first second toe also using silver alginate and ketoconazole cream also looks better 04/20/18; the patient was worked in early today out of concerns with his cellulitis on the right leg. I had started him on doxycycline. This was 2 days ago. His wife was concerned about the swelling in the area. Also concerned about the left buttock. He has not been  systemically unwell no fever chills. No nausea vomiting or diarrhea 04/25/18; the patient's left buttock wound is continued to deteriorate he is using Hydrofera Blue. He is still completing clindamycin for the cellulitis on the right leg although all of this looks better. 05/02/18 ooLeft buttock wound still with a lot of drainage and a very tightly adherent fibrinous necrotic surface. He has a deeper area superiorly ooThe left lateral calf wound is still closed ooDTI wound on the left heel necrotic surface especially the circumference using Iodoflex ooAreas between his left first second toe and right first second toe both look better. Dorsally and the right first second toe he had a necrotic surface although at smaller. In using silver alginate and ketoconazole. I did a culture last week which was a deep tissue culture of the reminiscence of the open wound on the right first second toe dorsally. This grew a few Acinetobacter and a few methicillin-resistant staph aureus. Nevertheless the area actually this week looked better. I didn't feel the need to specifically address this at least in terms of systemic antibiotics. 05/09/18; wounds are measuring larger more drainage per our intake. We are using Santyl covered with alginate on the large superficial buttock wounds, Iodosorb on the left heel, ketoconazole and silver alginate to the dorsal first and second toes bilaterally. 05/16/18; ooThe area on his left buttock better in some aspects although the area superiorly over the ischial tuberosity required an extensive debridement.using Santyl ooLeft heel appears stable. Using Iodoflex ooThe areas between his first and second toes are not bad however there is spreading erythema up the dorsal aspect of his left foot this looks like cellulitis again. He is insensate the erythema is really very brilliant.o Erysipelas He went to see an allergist days ago because he was itching part of this he had lab  work done. This showed a white count of 15.1 with 70% neutrophils. Hemoglobin of 11.4 and a platelet count of 659,000. Last white count we had in Epic was  a 2-1/2 years ago which was 25.9 but he was ill at the time. He was able to show me some lab work that was done by his primary physician the pattern is about the same. 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 But prompted me to go ahead and do x-rays of both feet and the pelvis rule out osteomyelitis. He also had a comprehensive metabolic panel this was reasonably normal his albumin was 3.7 liver function tests BUN/creatinine all normal 05/23/18; x-rays of both his feet from last week were negative for underlying pulmonary abnormality. The x-ray of his pelvis however showed mild irregularity in the left ischial which may represent some early osteomyelitis. The wound in the left ischial continues to get deeper clearly now exposed muscle. Each week necrotic surface material over this area. Whereas the rest of the wounds do not look so bad. ooThe left ischial wound we have been using Santyl and calcium alginate ooT the left heel surface necrotic debris using Iodoflex o ooThe left lateral leg is still healed ooAreas on the left dorsal foot and the right dorsal foot are about the same. There is some inflammation on the left which might represent contact dermatitis, fungal dermatitis I am doubtful cellulitis although this looks better than last week 05/30/18; CT scan done at Hospital did not show any osteomyelitis or abscess. Suggested the possibility of underlying cellulitis although I don't see a lot of evidence of this at the bedside ooThe wound itself on the left buttock/upper thigh actually looks somewhat better. No debridement ooLeft heel also looks better no debridement continue Iodoflex ooBoth dorsal first second toe spaces appear better using Lotrisone. Left still required  debridement 06/06/18; ooIntake reported some purulent looking drainage from the left gluteal wound. Using Santyl and calcium alginate ooLeft heel looks better although still a nonviable surface requiring debridement ooThe left dorsal foot first/second webspace actually expanding and somewhat deeper. I may consider doing a shave biopsy of this area ooRight dorsal foot first/second webspace appears stable to improved. Using Lotrisone and silver alginate to both these areas 06/13/18 ooLeft gluteal surface looks better. Now separated in the 2 wounds. No debridement required. Still drainage. We'll continue silver alginate ooLeft heel continues to look better with Iodoflex continue this for at least another week ooOf his dorsal foot wounds the area on the left still has some depth although it looks better than last week. We've been using Lotrisone and silver alginate 06/20/18 ooLeft gluteal continues to look better healthy tissue ooLeft heel continues to look better healthy granulation wound is smaller. He is using Iodoflex and his long as this continues continue the Iodoflex ooDorsal right foot looks better unfortunately dorsal left foot does not. There is swelling and erythema of his forefoot. He had minor trauma to this several days ago but doesn't think this was enough to have caused any tissue injury. Foot looks like cellulitis, we have had this problem before 06/27/18 on evaluation today patient appears to be doing a little worse in regard to his foot ulcer. 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 Both of which would really be his only options for treating this infection. In the past he has been given and effusion of Orbactiv. This is done very well for him in the past again it's one time dosing IV  antibiotic therapy. Subsequently I do believe this is  something we're gonna need to see about doing at this point in time. Currently his other wounds seem to be doing somewhat better in my pinion I'm pretty happy in that regard. 07/03/18 on evaluation today patient's wounds actually appear to be doing fairly well. He has been tolerating the dressing changes without complication. All in all he seems to be showing signs of improvement. In regard to the antibiotics he has been dealing with infectious disease since I saw him last week as far as getting this scheduled. In the end [facility]'s [facility] this coming Friday. In the meantime he has been continuing to perform the dressing changes in such as previous. There does not appear to be any evidence of infection worsengin at this time. 07/10/18; ooSince I last saw this man 2 weeks ago things have actually improved. IV antibiotics of resulted in less forefoot erythema although there is still some present. He is not systemically unwell ooLeft buttock wounds o2 now have no depth there is increased epithelialization Using silver alginate ooLeft heel still requires debridement using Iodoflex ooLeft dorsal foot still with a sizable wound about the size of a border but healthy granulation ooRight dorsal foot still with a slitlike area using silver alginate 07/18/18; the patient's cellulitis in the left foot is improved in fact I think it is on its way to resolving. ooLeft buttock wounds o2 both look better although the larger one has hypertension granulation we've been using silver alginate ooLeft heel has some thick circumferential redundant skin over the wound edge which will need to be removed today we've been using Iodoflex ooLeft dorsal foot is still a sizable wound required debridement using silver alginate ooThe right dorsal foot is just about closed only a small open area remains here 07/25/18; left foot cellulitis is resolved ooLeft buttock  wounds o2 both look better. Hyper-granulation on the major area ooLeft heel as some debris over the surface but otherwise looks a healthier wound. Using silver collagen ooRight dorsal foot is just about closed 07/31/18; arrives with our intake nurse worried about purulent drainage from the buttock. We had hyper-granulation here last week ooHis buttock wounds o2 continue to look better ooLeft heel some debris over the surface but measuring smaller. ooRight dorsal foot unfortunately has openings between the toes ooLeft foot superficial wound looks less aggravated. 08/07/18 ooButtock wounds continue to look better although some of her granulation and the larger medial wound. silver alginate ooLeft heel continues to look a lot better.silver collagen ooLeft foot superficial wound looks less stable. Requires debridement. He has a new wound superficial area on the foot on the lateral dorsal foot. ooRight foot looks better using silver alginate without Lotrisone 08/14/2018; patient was in the ER last week diagnosed with a UTI. He is now on Cefpodoxime and Macrodantin. ooButtock wounds continued to be smaller. Using silver alginate ooLeft heel continues to look better using silver collagen ooLeft foot superficial wound looks as though it is improving ooRight dorsal foot area is just about healed. 08/21/2018; patient is completed his antibiotics for his UTI. ooHe has 2 open areas on the buttocks. There is still not closed although the surface looks satisfactory. Using silver alginate ooLeft heel continues to improve using silver collagen ooThe bilateral dorsal foot areas which are at the base of his first and second toes/possible tinea pedis are actually stable on the left but worse on  the right. The area on the left required debridement of necrotic surface. After debridement I obtained a specimen for PCR culture. ooThe right dorsal foot which is been just about healed last week is now  reopened 08/28/2018; culture done on the left dorsal foot showed coag negative staph both staph epidermidis and Lugdunensis. I think this is worthwhile initiating systemic treatment. I will use doxycycline given his long list of allergies. The area on the left heel slightly improved but still requiring debridement. ooThe large wound on the buttock is just about closed whereas the smaller one is larger. Using silver alginate in this area 09/04/2018; patient is completing his doxycycline for the left foot although this continues to be a very difficult wound area with very adherent necrotic debris. We are using silver alginate to all his wounds right foot left foot and the small wounds on his buttock, silver collagen on the left heel. 09/11/2018; once again this patient has intense erythema and swelling of the left forefoot. Lesser degrees of erythema in the right foot. He has a long list of allergies and intolerances. I will reinstitute doxycycline. 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 Using silver alginate ooLeft heel also looks better using silver collagen ooUnfortunately both the areas on his feet look worse. The area on the left first second webspace is now gone through to the plantar part of his foot. The area on the left foot anteriorly is irritated with erythema and swelling in the forefoot. 09/25/2018 ooHis wound on the left plantar heel looks better. Using silver collagen ooThe area on the left buttock 2 small remnant areas. One is closed one is still open. Using silver alginate ooThe areas between both his first and second toes look worse. This in spite of long-standing antifungal therapy with ketoconazole and silver alginate which should have antifungal activity ooHe has small areas around his original wound on the left calf one is on the bottom of the original scar tissue and one superiorly both of these are small  and superficial but again given wound history in this site this is worrisome 10/02/2018 ooLeft plantar heel continues to gradually contract using silver collagen ooLeft buttock wound is unchanged using silver alginate ooThe areas on his dorsal feet between his first and second toes bilaterally look about the same. 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 Small open area here. We have been using silver alginate 10/09/2018; ooLeft plantar heel continues to look healthy and contract. No debridement is required ooLeft buttock slightly smaller with a tape injury wound just below which was new this week ooDorsal feet somewhat improved I have been using clindamycin ooLeft lateral looks lower extremity the actual open area looks worse although a lot of this is epithelialized. I am going to change to silver collagen today He has a lot more swelling in the right leg although this is not pitting not red and not particularly warm there is a lot of spasm in the right leg usually indicative of people with paralysis of some underlying discomfort. We have reviewed his vascular status from 2017 he had a left greater saphenous vein ablation. I wonder about referring him back to vascular surgery if the area on the left leg continues to deteriorate. 10/16/2018 in today for follow-up and management  of multiple lower extremity ulcers. His left Buttock wound is much lower smaller and almost closed completely. The wound to the left ankle has began to reopen with Epithelialization and some adherent slough. He has multiple new areas to the left foot and leg. The left dorsal foot without much improvement. Wound present between left great webspace and 2nd toe. Erythema and edema present right leg. Right LE ultrasound obtained on 10/10/18  was negative for DVT . 10/23/2018; ooLeft buttock is closed over. Still dry macerated skin but there is no open wound. I suspect this is chronic pressure/moisture ooLeft lateral calf is quite a bit worse than when I saw this last. There is clearly drainage here he has macerated skin into the left plantar heel. We will change the primary dressing to alginate ooLeft dorsal foot has some improvement in overall wound area. Still using clindamycin and silver alginate ooRight dorsal foot about the same as the left using clindamycin and silver alginate ooThe erythema in the right leg has resolved. He is DVT rule out was negative ooLeft heel pressure area required debridement although the wound is smaller and the surface is health 10/26/2018 ooThe patient came back in for his nurse check today predominantly because of the drainage coming out of the left lateral leg with a recent reopening of his original wound on the left lateral calf. He comes in today with a large amount of surrounding erythema around the wound extending from the calf into the ankle and even in the area on the dorsal foot. He is not systemically unwell. He is not febrile. Nevertheless this looks like cellulitis. We have been using silver alginate to the area. I changed him to a regular visit and I am going to prescribe him doxycycline. The rationale here is a long list of medication intolerances and a history of MRSA. I did not see anything that I thought would provide a valuable culture 10/30/2018 ooFollow-up from his appointment 4 days ago with really an extensive area of cellulitis in the left calf left lateral ankle and left dorsal foot. I put him on doxycycline. He has a long list of medication allergies which are true allergy reactions. 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 In any case he is a lot better today. The erythema especially in the anterior and  lateral left calf is better. He still has left ankle erythema. He also is complaining about increasing edema in the right leg we have only been using Kerlix Coban and he has been doing the wraps at home. Finally he has a spotty rash on the medial part of his upper left calf which looks like folliculitis or perhaps wrap occlusion type injury. Small superficial macules not pustules 11/06/18 patient arrives today with again a considerable degree of erythema around the wound on the left lateral calf extending into the dorsal ankle and dorsal foot. This is a lot worse than when I saw this last week. He is on doxycycline really with not a lot of improvement. He has not been systemically unwell Wounds on the; left heel actually looks improved. Original area on the left foot and proximity to the first and second toes looks about the same. He has superficial areas on the dorsal foot, anterior calf and then the reopening of his original wound on the left lateral calf which looks about the same ooThe only area he has on the right is the dorsal webspace first and  second which is smaller. ooHe has a large area of dry erythematous skin on the left buttock small open area here. 11/13/2018; the patient arrives in much better condition. The erythema around the wound on the left lateral calf is a lot better. Not sure whether this was the clindamycin or the TCA and ketoconazole or just in the improvement in edema control [stasis dermatitis]. In any case this is a lot better. The area on the left heel is very small and just about resolved using silver collagen we have been using silver alginate to the areas on his dorsal feet 11/20/2018; his wounds include the left lateral calf, left heel, dorsal aspects of both feet just proximal to the first second webspace. He is stable to slightly improved. I did not think any changes to his dressings were going to be necessary 11/27/2018 he has a reopening on the left buttock which  is surrounded by what looks like tinea or perhaps some other form of dermatitis. The area on the left dorsal foot has some erythema around it I have marked this area but I am not sure whether this is cellulitis or not. Left heel is not closed. Left calf the reopening is really slightly longer and probably worse 1/13; in general things look better and smaller except for the left dorsal foot. Area on the left heel is just about closed, left buttock looks better only a small wound remains in the skin looks better [using Lotrisone] 1/20; the area on the left heel only has a few remaining open areas here. Left lateral calf about the same in terms of size, left dorsal foot slightly larger right lateral foot still not closed. The area on the left buttock has no open wound and the surrounding skin looks a lot better 1/27; the area on the left heel is closed. Left lateral calf better but still requiring extensive debridements. The area on his left buttock is closed. He still has the open areas on the left dorsal foot which is slightly smaller in the right foot which is slightly expanded. We have been using Iodoflex on these areas as well 2/3; left heel is closed. Left lateral calf still requiring debridement using Iodoflex there is no open area on his left buttock however he has dry scaly skin over a large area of this. Not really responding well to the Lotrisone. Finally the areas on his dorsal feet at the level of the first second webspace are slightly smaller on the right and about the same on the left. Both of these vigorously debrided with Anasept and gauze 2/10; left heel remains closed he has dry erythematous skin over the left buttock but there is no open wound here. Left lateral leg has come in and with. Still requiring debridement we have been using Iodoflex here. Finally the area on the left dorsal foot and right dorsal foot are really about the same extremely dry callused fissured areas. He does not  yet have a dermatology appointment 2/17; left heel remains closed. He has a new open area on the left buttock. The area on the left lateral calf is bigger longer and still covered in necrotic debris. No major change in his foot areas bilaterally. I am awaiting for a dermatologist to look on this. We have been using ketoconazole I do not know that this is been doing any good at all. 2/24; left heel remains closed. The left buttock wound that was new reopening last week looks better. The left lateral calf  appears better also although still requires debridement. The major area on his foot is the left first second also requiring debridement. We have been putting Prisma on all wounds. I do not believe that the ketoconazole has done too much good for his feet. He will use Lotrisone I am going to give him a 2-week course of terbinafine. We still do not have a dermatology appointment 3/2 left heel remains closed however there is skin over bone in this area I pointed this out to him today. The left buttock wound is epithelialized but still does not look completely stable. The area on the left leg required debridement were using silver collagen here. With regards to his feet we changed to Lotrisone last week and silver alginate. 3/9; left heel remains closed. Left buttock remains closed. The area on the right foot is essentially closed. The left foot remains unchanged. Slightly smaller on the left lateral calf. Using silver collagen to both of these areas 3/16-Left heel remains closed. Area on right foot is closed. Left lateral calf above the lateral malleolus open wound requiring debridement with easy bleeding. Left dorsal wound proximal to first toe also debrided. Left ischial area open new. Patient has been using Prisma with wrapping every 3 days. Dermatology appointment is apparently tomorrow.Patient has completed his terbinafine 2-week course with some apparent improvement according to him, there is  still flaking and dry skin in his foot on the left 3/23; area on the right foot is reopened. The area on the left anterior foot is about the same still a very necrotic adherent surface. He still has the area on the left leg and reopening is on the left buttock. He apparently saw dermatology although I do not have a note. According to the patient who is usually fairly well informed they did not have any good ideas. Put him on oral terbinafine which she is been on before. 3/30; using silver collagen to all wounds. Apparently his dermatologist put him on doxycycline and rifampin presumably some culture grew staph. I do not have this result. He remains on terbinafine although I have used terbinafine on him before 4/6; patient has had a fairly substantial reopening on the right foot between the first and second toes. He is finished his terbinafine and I believe is on doxycycline and rifampin still as prescribed by dermatology. We have been using silver collagen to all his wounds although the patient reports that he thinks silver alginate does better on the wounds on his buttock. 4/13; the area on his left lateral calf about the same size but it did not require debridement. ooLeft dorsal foot just proximal to the webspace between the first and second toes is about the same. Still nonviable surface. I note some superficial bronze discoloration of the dorsal part of his foot ooRight dorsal foot just proximal to the first and second toes also looks about the same. I still think there may be the same discoloration I noted above on the left ooLeft buttock wound looks about the same 4/20; left lateral calf appears to be gradually contracting using silver collagen. 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 The left dorsal foot wound is debrided of tightly adherent necrotic debris and really cleans up quite nicely. The right area is worse with  expansion. I did not debride this it is now over the base of the second toe ooThe area on his left buttock is smaller no debridement is required using silver collagen 5/4;  left calf continues to make good progress. ooHe arrives with erythema around the wounds on his dorsal foot which even extends to the plantar aspect. Very concerning for coexistent infection. He is finished the erythromycin I gave him for possible erythrasma this does not seem to have helped. ooThe area on the left foot is about the same base of the dorsal toes ooIs area on the buttock looks improved on the left 5/11; left calf and left buttock continued to make good progress. Left foot is about the same to slightly improved. ooMajor problem is on the right foot. He has not had an x-ray. Deep tissue culture I did last week showed both Enterobacter and E. coli. I did not change the doxycycline I put him on empirically although neither 1 of these were plated to doxycycline. He arrives today with the erythema looking worse on both the dorsal and plantar foot. Macerated skin on the bottom of the foot. he has not been systemically unwell 5/18-Patient returns at 1 week, left calf wound appears to be making some progress, left buttock wound appears slightly worse than last time, left foot wound looks slightly better, right foot redness is marginally better. X-ray of both feet show no air or evidence of osteomyelitis. Patient is finished his Omnicef and terbinafine. He continues to have macerated skin on the bottom of the left foot as well as right 5/26; left calf wound is better, left buttock wound appears to have multiple small superficial open areas with surrounding macerated skin. X-rays that I did last time showed no evidence of osteomyelitis in either foot. He is finished cefdinir and doxycycline. I do not think that he was on terbinafine. He continues to have a large superficial open area on the right foot anterior dorsal and  slightly between the first and second toes. I did send him to dermatology 2 months ago or so wondering about whether they would do a fungal scraping. I do not believe they did but did do a culture. We have been using silver alginate to the toe areas, he has been using antifungals at home topically either ketoconazole or Lotrisone. We are using silver collagen on the left foot, silver alginate on the right, silver collagen on the left lateral leg and silver alginate on the left buttock 6/1; left buttock area is healed. We have the left dorsal foot, left lateral leg and right dorsal foot. We are using silver alginate to the areas on both feet and silver collagen to the area on his left lateral calf 6/8; the left buttock apparently reopened late last week. He is not really sure how this happened. He is tolerating the terbinafine. Using silver alginate to all wounds 6/15; left buttock wound is larger than last week but still superficial. ooCame in the clinic today with a report of purulence from the left lateral leg I did not identify any infection ooBoth areas on his dorsal feet appear to be better. He is tolerating the terbinafine. Using silver alginate to all wounds 6/22; left buttock is about the same this week, left calf quite a bit better. His left foot is about the same however he comes in with erythema and warmth in the right forefoot once again. Culture that I gave him in the beginning of May showed Enterobacter and E. coli. I gave him doxycycline and things seem to improve although neither 1 of these organisms was specifically plated. 6/29; left buttock is larger and dry this week. Left lateral calf looks to me  to be improved. Left dorsal foot also somewhat improved right foot completely unchanged. The erythema on the right foot is still present. He is completing the Ceftin dinner that I gave him empirically [see discussion above.) 7/6 - All wounds look to be stable and perhaps improved, the  left buttock wound is slightly smaller, per patient bleeds easily, completed ceftin, the right foot redness is less, he is on terbinafine 7/13; left buttock wound about the same perhaps slightly narrower. Area on the left lateral leg continues to narrow. Left dorsal foot slightly smaller right foot about the same. We are using silver alginate on the right foot and Hydrofera Blue to the areas on the left. Unna boot on the left 2 layer compression on the right 7/20; left buttock wound absolutely the same. Area on lateral leg continues to get better. Left dorsal foot require debridement as did the right no major change in the 7/27; left buttock wound the same size necrotic debris over the surface. The area on the lateral leg is closed once again. His left foot looks better right foot about the same although there is some involvement now of the posterior first second toe area. He is still on terbinafine which I have given him for a month, not certain a centimeter major change 06/25/19-All wounds appear to be slightly improved according to report, left buttock wound looks clean, both foot wounds have minimal to no debris the right dorsal foot has minimal slough. We are using Hydrofera Blue to the left and silver alginate to the right foot and ischial wound. 8/10-Wounds all appear to be around the same, the right forefoot distal part has some redness which was not there before, however the wound looks clean and small. Ischial wound looks about the same with no changes 8/17; his wound on the left lateral calf which was his original chronic venous insufficiency wound remains closed. Since I last saw him the areas on the left dorsal foot right dorsal foot generally appear better but require debridement. The area on his left initial tuberosity appears somewhat larger to me perhaps hyper granulated and bleeds very easily. We have been using Hydrofera Blue to the left dorsal foot and silver alginate to everything  else 8/24; left lateral calf remains closed. The areas on his dorsal feet on the webspace of the first and second toes bilaterally both look better. The area on the left buttock which is the pressure ulcer stage II slightly smaller. I change the dressing to Hydrofera Blue to all areas 8/31; left lateral calf remains closed. The area on his dorsal feet bilaterally look better. Using Hydrofera Blue. Still requiring debridement on the left foot. No change in the left buttock pressure ulcers however 9/14; left lateral calf remains closed. Dorsal feet look quite a bit better than 2 weeks ago. Flaking dry skin also a lot better with the ammonium lactate I gave him 2 weeks ago. The area on the left buttock is improved. He states that his Roho cushion developed a leak and he is getting a new one, in the interim he is offloading this vigorously 9/21; left calf remains closed. Left heel which was a possible DTI looks better this week. He had macerated tissue around the left dorsal foot right foot looks satisfactory and improved left buttock wound. I changed his dressings to his feet to silver alginate bilaterally. Continuing Hydrofera Blue on the left buttock. 9/28 left calf remains closed. Left heel did not develop anything [possible DTI] dry flaking  skin on the left dorsal foot. Right foot looks satisfactory. Improved left buttock wound. We are using silver alginate on his feet Hydrofera Blue on the buttock. I have asked him to go back to the Lotrisone on his feet including the wounds and surrounding areas 10/5; left calf remains closed. The areas on the left and right feet about the same. A lot of this is epithelialized however debris over the remaining open areas. He is using Lotrisone and silver alginate. The area on the left buttock using Hydrofera Blue 10/26. Patient has been out for 3 weeks secondary to Covid concerns. He tested negative but I think his wife tested positive. He comes in today with the  left foot substantially worse, right foot about the same. Even more concerning he states that the area on his left buttock closed over but then reopened and is considerably deeper in one aspect than it was before [stage III wound] 11/2; left foot really about the same as last week. Quarter sized wound on the dorsal foot just proximal to the first second toes. Surrounding erythema with areas of denuded epithelium. This is not really much different looking. Did not look like cellulitis this time however. ooRight foot area about the same.. We have been using silver alginate alginate on his toes ooLeft buttock still substantial irritated skin around the wound which I think looks somewhat better. We have been using Hydrofera Blue here. 11/9; left foot larger than last week and a very necrotic surface. Right foot I think is about the same perhaps slightly smaller. Debris around the circumference also addressed. Unfortunately on the left buttock there is been a decline. Satellite lesions below the major wound distally and now a an additional one posteriorly we have been using Hydrofera Blue but I think this is a pressure issue 11/16; left foot ulcer dorsally again a very adherent necrotic surface. Right foot is about the same. Not much change in the pressure ulcer on his left buttock. 11/30; left foot ulcer dorsally basically the same as when I saw him 2 weeks ago. Very adherent fibrinous debris on the wound surface. Patient reports a lot of drainage as well. 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 We have been using Iodoflex, patient changed back to alginate because of the drainage. Area on his right dorsal foot really looks benign with a healthier surface certainly a lot better than on the left. Left buttock wounds all improved using Hydrofera Blue 12/7; left dorsal foot again no improvement. Tightly adherent debris. PCR culture I did last week only  showed likely skin contaminant. I have gone ahead and done a punch biopsy of this which is about the last thing in terms of investigations I can think to do. He has known venous insufficiency and venous hypertension and this could be the issue here. The area on the right foot is about the same left buttock slightly worse according to our intake nurse secondary to [facility] sticking to the wound 12/14; biopsy of the left foot that I did last time showed changes that could be related to wound healing/chronic stasis dermatitis phenomenon no neoplasm. We have been using silver alginate to both feet. I change the one on the left today to Sorbact and silver alginate to his other 2 wounds 12/28; the patient arrives with the following problems; ooMajor issue is the dorsal left foot which continues to be a larger deeper wound area. Still with a completely nonviable surface   ooParadoxically the area mirror image on the right on the right dorsal foot appears to be getting better. ooHe had some loss of dry denuded skin from the lower part of his original wound on the left lateral calf. Some of this area looked a little vulnerable and for this reason we put him in wrap that on this side this week ooThe area on his left buttock is larger. He still has the erythematous circular area which I think is a combination of pressure, sweat. This does not look like cellulitis or fungal dermatitis 11/26/2019; -Dorsal left foot large open wound with depth. Still debris over the surface. Using Sorbact ooThe area on the dorsal right foot paradoxically has closed over [facility] has a reopening on the left ankle laterally at the base of his original wound that extended up into the calf. This appears clean. ooThe left buttock wound is smaller but with very adherent necrotic debris over the surface. We have been using silver alginate here as well The patient had arterial studies done in 2017. He had biphasic waveforms at the  dorsalis pedis and posterior tibial bilaterally. ABI in the left was 1.17. Digit waveforms were dampened. He has slight spasticity in the great toes I do not think a TBI would be possible 1/11; the patient comes in today with a sizable reopening between the first and second toes on the right. This is not exactly in the same location where we have been treating wounds previously. According to our intake nurse this was actually fairly deep but 0.6 cm. The area on the left dorsal foot looks about the same the surface is somewhat cleaner using Sorbact, his MRI is in 2 days. We have not managed yet to get arterial studies. The new reopening on the left lateral calf looks somewhat better using alginate. The left buttock wound is about the same using alginate 1/18; the patient had his ARTERIAL studies which were quite normal. ABI in the right at 1.13 with triphasic/biphasic waveforms on the left ABI 1.06 again with triphasic/biphasic waveforms. It would not have been possible to have done a toe brachial index because of spasticity. We have been using Sorbac to the left foot alginate to the rest of his wounds on the right foot left lateral calf and left buttock 1/25; arrives in clinic with erythema and swelling of the left forefoot worse over the first MTP area. This extends laterally dorsally and but also posteriorly. Still has an area on the left lateral part of the lower part of his calf wound it is eschared and clearly not closed. ooArea on the left buttock still with surrounding irritation and erythema. ooRight foot surface wound dorsally. The area between the right and first and second toes appears better. 2/1; ooThe left foot wound is about the same. Erythema slightly better I gave him a week of doxycycline empirically ooRight foot wound is more extensive extending between the toes to the plantar surface ooLeft lateral calf really no open surface on the inferior part of his original wound however  the entire area still looks vulnerable ooAbsolutely no improvement in the left buttock wound required debridement. 2/8; the left foot is about the same. Erythema is slightly improved I gave him clindamycin last week. ooRight foot looks better he is using Lotrimin and silver alginate ooHe has a breakdown in the left lateral calf. Denuded epithelium which I have removed ooLeft buttock about the same were using Hydrofera Blue 2/15; left foot is about the same there is  less surrounding erythema. Surface still has tightly adherent debris which I have debriding however not making any progress ooRight foot has a substantial wound on the medial right second toe between the first and second webspace. ooStill an open area on the left lateral calf distal area. ooButtock wound is about the same 2/22; left foot is about the same less surrounding erythema. Surface has adherent debris. Polymen Ag Right foot area significant wound between the first and second toes. We have been using silver alginate here Left lateral leg polymen Ag at the base of his original venous insufficiency wound ooLeft buttock some improvement here 3/1; ooRight foot is deteriorating in the first second toe webspace. Larger and more substantial. We have been using silver alginate. ooLeft dorsal foot about the same markedly adherent surface debris using PolyMem Ag ooLeft lateral calf surface debris using PolyMem AG ooLeft buttock is improved again using PolyMem Ag. ooHe is completing his terbinafine. The erythema in the foot seems better. He has been on this for 2 weeks 3/8; no improvement in any wound area in fact he has a small open area on the dorsal midfoot which is new this week. He has not gotten his foot x-rays yet 3/15; his x-rays were both negative for osteomyelitis of both feet. No major change in any of his wounds on the extremities however his buttock wounds are better. We have been using polymen on the buttocks,  left lower leg. Iodoflex on the left foot and silver alginate on the right 3/22; arrives in clinic today with the 2 major issues are the improvement in the left dorsal foot wound which for once actually looks healthy with a nice healthy wound surface without debridement. Using Iodoflex here. Unfortunately on the left lateral calf which is in the distal part of his original wound he came to the clinic here for there was purulent drainage noted some increased breakdown scattered around the original area and a small area proximally. We we are using polymen here will change to silver alginate today. His buttock wound on the left is better and I think the area on the right first second toe webspace is also improved 3/29; left dorsal foot looks better. Using Iodoflex. Left ankle culture from deterioration last time grew E. coli, Enterobacter and Enterococcus. I will give him a course of cefdinir although that will not cover Enterococcus. The area on the right foot in the webspace of the first and second toe lateral first toe looks better. The area on his buttock is about healed Vascular appointment is on April 21. This is to look at his venous system vis--vis continued breakdown of the wounds on the left including the left lateral leg and left dorsal foot he. He has had previous ablations on this side 4/5; the area between the right first and second toes lateral aspect of the first toe looks better. Dorsal aspect of the left first toe on the left foot also improved. Unfortunately the left lateral lower leg is larger and there is a second satellite wound superiorly. The usual superficial abrasions on the left buttock overall better but certainly not closed 4/12; the area between the right first and second toes is improved. Dorsal aspect of the left foot also slightly smaller with a vibrant healthy looking surface. No real change in the left lateral leg and the left buttock wound is healed He has an  unaffordable co-pay for Apl[employer]. Appointment with vein and vascular with regards to the left leg venous part of  the circulation is on 4/21 4/19; we continue to see improvement in all wound areas. Although this is minor. He has his vascular appointment on 4/21. The area on the left buttock has not reopened although right in the center of this area the skin looks somewhat threatened 4/26; the left buttock is unfortunately reopened. In general his left dorsal foot has a healthy surface and looks somewhat smaller although it was not measured as such. The area between his first and second toe webspace on the right as a small wound against the first toe. The patient saw vascular surgery. The real question I was asking was about the small saphenous vein on the left. He has previously ablated left greater saphenous vein. Nothing further was commented on on the left. Right greater saphenous vein without reflux at the saphenofemoral junction or proximal thigh there was no indication for ablation of the right greater saphenous vein duplex was negative for DVT bilaterally. They did not think there was anything from a vascular surgery point of view that could be offered. They ABIs within normal limits 5/3; only small open area on the left buttock. The area on the left lateral leg which was his original venous reflux is now 2 wounds both which look clean. We are using Iodoflex on the left dorsal foot which looks healthy and smaller. He is down to a very tiny area between the right first and second toes, using silver alginate 5/10; all of his wounds appear better. We have much better edema control in 4 layer compression on the left. This may be the factor that is allowing the left foot and left lateral calf to heal. He has external compression garments at home 04/14/20-All of his wounds are progressing well, the left forefoot is practically closed, left ischium appears to be about the same, right toe webspace is  also smaller. The left lateral leg is about the same, continue using Hydrofera Blue to this, silver alginate to the ischium, Iodoflex to the toe space on the right 6/7; most of his wounds outside of the left buttock are doing well. The area on the left lateral calf and left dorsal foot are smaller. The area on the right foot in between the first and second toe webspace is barely visible although he still says there is some drainage here is the only reason I did not heal this out. ooUnfortunately the area on the left buttock almost looks like he has a skin tear from tape. He has open wound and then a large flap of skin that we are trying to get adherence over an area just next to the remaining wound 6/21; 2 week follow-up. I believe is been here for nurse visits. Miraculously the area between his first and second toes on the left dorsal foot is closed over. Still open on the right first second web space. The left lateral calf has 2 open areas. Distally this is more superficial. The proximal area had a little more depth and required debridement of adherent necrotic material. His buttock wound is actually larger we have been using silver alginate here 6/28; the patient's area on the left foot remains closed. Still open wet area between the first and second toes on the right and also extending into the plantar aspect. We have been using silver alginate in this location. He has 2 areas on the left lower leg part of his original long wounds which I think are better. We have been using Hydrofera Blue here. Hydrofera Blue  to the left buttock which is stable 7/12; left foot remains closed. Left ankle is closed. May be a small area between his right first and second toes the only truly open area is on the left buttock. We have been using Hydrofera Blue here 7/19; patient arrives with marked deterioration especially in the left foot and ankle. We did not put him in a compression wrap on the left last week in  fact he wore his juxta lite stockings on either side although he does not have an underlying stocking. He has a reopening on the left dorsal foot, left lateral ankle and a new area on the right dorsal ankle. More worrisome is the degree of erythema on the left foot extending on the lateral foot into the lateral lower leg on the left 7/26; the patient had erythema and drainage from the lateral left ankle last week. Culture of this grew MRSA resistant to doxycycline and clindamycin which are the 2 antibiotics we usually use with this patient who has multiple antibiotic allergies including linezolid, trimethoprim sulfamethoxazole. I had give him an empiric doxycycline and he comes in the area certainly looks somewhat better although it is blotchy in his lower leg. He has not been systemically unwell. He has had areas on the left dorsal foot which is a reopening, chronic wounds on the left lateral ankle. Both of these I think are secondary to chronic venous insufficiency. The area between his first and second toes is closed as far as I can tell. He had a new wrap injury on the right dorsal ankle last week. Finally he has an area on the left buttock. We have been using silver alginate to everything except the left buttock we are using Hydrofera Blue 06/30/20-Patient returns at 1 week, has been given a sample dose pack of NUZYRA which is a tetracycline derivative [omadacycline], patient has completed those, we have been using silver alginate to almost all the wounds except the left ischium where we are using Hydrofera Blue all of them look better 8/16; since I last saw the patient he has been doing well. The area on the left buttock, left lateral ankle and left foot are all closed today. He has completed the [place] I gave him last time and tolerated this well. He still has open areas on the right dorsal ankle and in the right first second toe area which we are using silver  alginate. Objective Constitutional Sitting or standing Blood Pressure is within target range for patient.. Pulse regular and within target range for patient.Marland Kitchen Respirations regular, non-labored and within target range.. Temperature is normal and within the target range for the patient.Marland Kitchen Appears in no distress. Vitals Time Taken: 8:14 AM, Height: 70 in, Source: Stated, Weight: 216 lbs, Source: Stated, BMI: 31, Temperature: 98.5 F, Pulse: 103 bpm, Respiratory Rate: 18 breaths/min, Blood Pressure: 127/78 mmHg. General Notes: Wound exam; the left buttock, left lateral ankle and left dorsal foot are all closed. His edema control is good ooOn the right he still has a small punched out area on the dorsal right ankle which I think was a wrap injury. He has areas on the right dorsal foot and the right plantar area around the first and second toe. No evidence of infection in these areas Integumentary (Hair, Skin) Wound #37R status is Open. Original cause of wound was Gradually Appeared. The wound is located on the Left,Lateral Malleolus. The wound measures 0cm length x 0cm width x 0cm depth; 0cm^2 area and 0cm^3 volume. There  is no tunneling or undermining noted. There is a none present amount of drainage noted. The wound margin is flat and intact. There is no granulation within the wound bed. There is no necrotic tissue within the wound bed. Wound #38 status is Open. Original cause of wound was Gradually Appeared. The wound is located on the Right T - Web between 1st and 2nd. The wound oe measures 0.6cm length x 0.1cm width x 0.5cm depth; 0.047cm^2 area and 0.024cm^3 volume. There is Fat Layer (Subcutaneous Tissue) Exposed exposed. There is no tunneling or undermining noted. There is a small amount of serosanguineous drainage noted. The wound margin is distinct with the outline attached to the wound base. There is large (67-100%) red granulation within the wound bed. There is no necrotic tissue within the  wound bed. Wound #41 status is Open. Original cause of wound was Gradually Appeared. The wound is located on the Left Ischium. The wound measures 0cm length x 0cm width x 0cm depth; 0cm^2 area and 0cm^3 volume. There is no tunneling or undermining noted. There is a none present amount of drainage noted. The wound margin is distinct with the outline attached to the wound base. There is no granulation within the wound bed. There is no necrotic tissue within the wound bed. Wound #42 status is Open. Original cause of wound was Gradually Appeared. The wound is located on the Right,Dorsal Ankle. The wound measures 1cm length x 0.8cm width x 0.1cm depth; 0.628cm^2 area and 0.063cm^3 volume. There is Fat Layer (Subcutaneous Tissue) Exposed exposed. There is no tunneling or undermining noted. There is a medium amount of serosanguineous drainage noted. The wound margin is flat and intact. There is large (67-100%) red granulation within the wound bed. There is a small (1-33%) amount of necrotic tissue within the wound bed including Adherent Slough. Wound #43 status is Open. Original cause of wound was Gradually Appeared. The wound is located on the Left,Dorsal Foot. The wound measures 0cm length x 0cm width x 0cm depth; 0cm^2 area and 0cm^3 volume. There is no tunneling or undermining noted. There is a none present amount of drainage noted. The wound margin is indistinct and nonvisible. There is no granulation within the wound bed. There is no necrotic tissue within the wound bed. Assessment Active Problems ICD-10 Chronic venous hypertension (idiopathic) with ulcer and inflammation of left lower extremity 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 to breakdown of skin Non-pressure chronic ulcer of right ankle limited to breakdown of skin Non-pressure chronic ulcer of other part of right foot limited to breakdown of skin Non-pressure chronic ulcer of other part of left foot limited to breakdown of  skin Cellulitis of left lower limb Pressure ulcer of left buttock, stage 3 Paraplegia, complete Plan Follow-up Appointments: Return Appointment in 1 week. Dressing Change Frequency: Wound #38 Right T - Web between 1st and 2nd: oe Change Dressing every other day. Wound #42 Right,Dorsal Ankle: Change Dressing every other day. Skin Barriers/Peri-Wound Care: Barrier cream - to left ischium daily Moisturizing lotion - both legs daily Wound Cleansing: May shower and wash wound with soap and water. - on days that dressing is changed Primary Wound Dressing: Wound #38 Right T - Web between 1st and 2nd: oe Calcium Alginate with Silver Wound #42 Right,Dorsal Ankle: Calcium Alginate with Silver Secondary Dressing: Wound #38 Right T - Web between 1st and 2nd: oe Kerlix/Rolled Gauze Dry Gauze Wound #42 Right,Dorsal Ankle: Kerlix/Rolled Gauze - secure with tape Dry Gauze Edema Control: Elevate legs  to the level of the heart or above for 30 minutes daily and/or when sitting, a frequency of: - throughout the day Support Garment 30-40 mm/Hg pressure to: - Juxtalite to both legs daily daily Off-Loading: Low air-loss mattress (Group 2) Roho cushion for wheelchair Turn and reposition every 2 hours - out of wheelchair throughout the day, try to lay on sides, sleep in the bed not the recliner Other: - ACE wrap to left leg today in clinic, pt to apply Juxtalite at home #1 I am continue with silver alginate to both wound areas on the right ankle and right first second toe areas 2. Transitioning to juxta lite stockings in both lower extremities. 3. I cautioned him again about keeping the legs elevated. He has known chronic venous insufficiency status post ablations and I think this is having something to do perhaps the major issue here in his refractory wounds in his lower extremities 4. Buttock wound is healed I have recommended zinc oxide once or twice a day to this area pressure relief again is  the issue Electronic Signature(s) Signed: 07/07/2020 5:53:04 PM By: Linton Ham MD Entered By: Linton Ham on 07/07/2020 09:44:50 -------------------------------------------------------------------------------- SuperBill Details Patient Name: Date of Service: Conrey, [name] 07/07/2020 Medical Record Number: [id number] Patient Account Number: [id number] Date of Birth/Sex: Treating RN: 19-Aug-1986 (34 y.o. [name] Primary Care Provider: [place], [place] Other Clinician: Referring Provider: Treating Provider/Extender: [name]: 235 Diagnosis Coding ICD-10 Codes Code Description I87.332 Chronic venous hypertension (idiopathic) with ulcer and inflammation of left lower extremity L97.321 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 to breakdown of skin L97.311 Non-pressure chronic ulcer of right ankle limited to breakdown of skin L97.511 Non-pressure chronic ulcer of other part of right foot limited to breakdown of skin L97.521 Non-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 L03.116 Cellulitis of left lower limb 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, 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, complete Facility Procedures CPT4 Code: 40981191 Description: 99214 - WOUND CARE VISIT-LEV 4 EST PT Modifier: Quantity: 1 Physician Procedures : CPT4 Code Description Modifier [id number] - WC PHYS LEVEL 3 - EST PT ICD-10 Diagnosis Description L97.311 Non-pressure chronic ulcer of right ankle limited to breakdown of skin L97.511 Non-pressure chronic ulcer of other part of right foot limited  to breakdown of skin L97.321 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 to breakdown of skin L97.521 Non-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 Quantity: 1 Electronic Signature(s) Signed: 07/07/2020 5:53:04 PM By: Linton Ham MD Entered By: Linton Ham on 07/07/2020 09:45:30

## 2020-07-14 ENCOUNTER — Encounter (HOSPITAL_BASED_OUTPATIENT_CLINIC_OR_DEPARTMENT_OTHER): Payer: BC Managed Care – PPO | Admitting: Internal Medicine

## 2020-07-14 DIAGNOSIS — G8221 Paraplegia, complete: Secondary | ICD-10-CM | POA: Diagnosis not present

## 2020-07-14 DIAGNOSIS — L97521 Non-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: Secondary | ICD-10-CM | POA: Diagnosis not present

## 2020-07-14 DIAGNOSIS — S91001A Unspecified open wound, right ankle, initial encounter: Secondary | ICD-10-CM | POA: Diagnosis not present

## 2020-07-14 DIAGNOSIS — L97311 Non-pressure chronic ulcer of right ankle limited to breakdown of skin: Secondary | ICD-10-CM | POA: Diagnosis not present

## 2020-07-14 DIAGNOSIS — S91302A Unspecified open wound, left foot, initial encounter: Secondary | ICD-10-CM | POA: Diagnosis not present

## 2020-07-14 DIAGNOSIS — L89323 Pressure ulcer of left buttock, stage 3: Secondary | ICD-10-CM | POA: Diagnosis not present

## 2020-07-14 DIAGNOSIS — L03116 Cellulitis of left lower limb: Secondary | ICD-10-CM | POA: Diagnosis not present

## 2020-07-14 DIAGNOSIS — L97321 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 to breakdown of skin: Secondary | ICD-10-CM | POA: Diagnosis not present

## 2020-07-14 DIAGNOSIS — I87332 Chronic venous hypertension (idiopathic) with ulcer and inflammation of left lower extremity: Secondary | ICD-10-CM | POA: Diagnosis not present

## 2020-07-14 DIAGNOSIS — Z89422 Acquired absence of other left toe(s): Secondary | ICD-10-CM | POA: Diagnosis not present

## 2020-07-14 DIAGNOSIS — S91101A Unspecified open wound of right great toe without damage to nail, initial encounter: Secondary | ICD-10-CM | POA: Diagnosis not present

## 2020-07-14 DIAGNOSIS — L97511 Non-pressure chronic ulcer of other part of right foot limited to breakdown of skin: Secondary | ICD-10-CM | POA: Diagnosis not present

## 2020-07-15 NOTE — Progress Notes (Signed)
Dohrmann, JAMAR WEATHERALL ([id number]) Visit Report for 06/23/2020 SuperBill Details Patient Name: Date of Service: Guimaraes, [name] 06/23/2020 Medical Record Number: [id number] Patient Account Number: [id number] Date of Birth/Sex: Treating RN: 1985/11/29 (34 y.o. [name] Primary Care Provider: [name], [name] Other Clinician: Referring Provider: Treating Provider/Extender: [name]: 233 Diagnosis Coding ICD-10 Codes Code Description I87.332 Chronic venous hypertension (idiopathic) with ulcer and inflammation of left lower extremity L97.321 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 to breakdown of skin L97.311 Non-pressure chronic ulcer of right ankle limited to breakdown of skin L97.511 Non-pressure chronic ulcer of other part of right foot limited to breakdown of skin L97.521 Non-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 L03.116 Cellulitis of left lower limb 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, 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, complete Facility Procedures CPT4 Code Description Modifier Quantity [id number] (Facility Use Only) [phone] - APPLY MULTLAY COMPRS LWR LT LEG 1 Electronic Signature(s) Signed: 06/23/2020 12:26:20 PM By: Kela Millin Signed: 07/14/2020 4:33:08 PM By: Linton Ham MD Entered By: Kela Millin on 06/23/2020 08:25:40

## 2020-07-15 NOTE — Progress Notes (Signed)
[name], ZEN CEDILLOS ([id number]) Visit Report for 07/14/2020 Arrival Information Details Patient Name: Date of Service: Bord, [name] Side. 07/14/2020 7:30 [name] Medical Record Number: [id number] Patient Account Number: [id number] Date of Birth/Sex: Treating RN: 02/11/1986 (34 y.o. [name] Primary Care Merlyn Bollen: [name], [name] Other Clinician: Referring Aedin Jeansonne: Treating Ayuub Penley/Extender: [name]: 24 Visit Information History Since Last Visit Added or deleted any medications: No Patient Arrived: Wheel Chair Any new allergies or adverse reactions: No Arrival Time: 07:54 Had [name] fall or experienced change in No Accompanied By: self activities of daily living that may affect Transfer Assistance: None risk of falls: Patient Requires Transmission-Based Precautions: No Signs or symptoms of abuse/neglect since last visito No Patient Has Alerts: Yes Hospitalized since last visit: No Patient Alerts: R ABI = 1.0 Implantable device outside of the clinic excluding No L ABI = 1.1 cellular tissue based products placed in the center since last visit: Has Dressing in Place as Prescribed: Yes Pain Present Now: No Electronic Signature(s) Signed: 07/14/2020 4:06:40 PM By: Kela Millin Entered By: Kela Millin on 07/14/2020 07:54:29 -------------------------------------------------------------------------------- Compression Therapy Details Patient Name: Date of Service: [name], [name] 07/14/2020 7:30 [name] Medical Record Number: [id number] Patient Account Number: [id number] Date of Birth/Sex: Treating RN: [birth year] (34 y.o. [name] Primary Care Marissah Vandemark: [place], [place] Other Clinician: Referring Dustine Bertini: Treating Chelcey Caputo/Extender: [name]: 236 Compression Therapy Performed for Wound Assessment: Wound #44 Left,Medial Foot Performed By: Clinician Levan Hurst, RN Compression Type: Four  Layer Post Procedure Diagnosis Same as Pre-procedure Electronic Signature(s) Signed: 07/14/2020 4:21:43 PM By: Levan Hurst RN, BSN Entered By: Levan Hurst on 07/14/2020 08:25:06 -------------------------------------------------------------------------------- Compression Therapy Details Patient Name: Date of Service: [name], [name] 07/14/2020 7:30 [name] Medical Record Number: [id number] Patient Account Number: [id number] Date of Birth/Sex: Treating RN: 1986-08-16 (34 y.o. [name] Primary Care Sherrel Ploch: [place], [place] Other Clinician: Referring Barbee Mamula: Treating Madysen Faircloth/Extender: [name]: 236 Compression Therapy Performed for Wound Assessment: Wound #45 Left,Lateral Foot Performed By: Clinician Levan Hurst, RN Compression Type: Four Layer Post Procedure Diagnosis Same as Pre-procedure Electronic Signature(s) Signed: 07/14/2020 4:21:43 PM By: Levan Hurst RN, BSN Entered By: Levan Hurst on 07/14/2020 08:25:06 -------------------------------------------------------------------------------- Encounter Discharge Information Details Patient Name: Date of Service: [name], [name] 07/14/2020 7:30 [name] Medical Record Number: [id number] Patient Account Number: [id number] Date of Birth/Sex: Treating RN: 05-Jan-1986 (34 y.o. [name] Primary Care Marijane Trower: [place], [place] Other Clinician: Referring Shanell Aden: Treating Courtlyn Aki/Extender: [name]: 236 Encounter Discharge Information Items Discharge Condition: Stable Ambulatory Status: Wheelchair Discharge Destination: Home Transportation: Private Auto Accompanied By: self Schedule Follow-up Appointment: Yes Clinical Summary of Care: Patient Declined Electronic Signature(s) Signed: 07/14/2020 4:21:44 PM By: Baruch Gouty RN, BSN Entered By: Baruch Gouty on 07/14/2020  08:43:47 -------------------------------------------------------------------------------- Lower Extremity Assessment Details Patient Name: Date of Service: [name], [name] 07/14/2020 7:30 [name] Medical Record Number: [id number] Patient Account Number: [id number] Date of Birth/Sex: Treating RN: May 19, 1986 (34 y.o. [name] Primary Care Carlito Bogert: [place], [place] Other Clinician: Referring Detra Bores: Treating Wilborn Membreno/Extender: [name]: 236 Edema Assessment Assessed: [Left: No] [Right: No] Edema: [Left: No] [Right: Yes] Calf Left: Right: Point of Measurement: 33 cm From Medial Instep 30 cm 33 cm Ankle Left: Right: Point of Measurement: 10 cm From Medial Instep 25.5 cm 23 cm Vascular Assessment Pulses: Dorsalis Pedis Palpable: [Left:No] [Right:No]  Electronic Signature(s) Signed: 07/14/2020 4:06:40 PM By: Kela Millin Entered By: Kela Millin on 07/14/2020 07:58:13 -------------------------------------------------------------------------------- Multi Wound Chart Details Patient Name: Date of Service: [name], [name] 07/14/2020 7:30 [name] Medical Record Number: [id number] Patient Account Number: [id number] Date of Birth/Sex: Treating RN: 15-Feb-1986 (35 y.o. [name] Primary Care Joron Velis: [name], [name] Other Clinician: Referring Pranay Hilbun: Treating Lunden Stieber/Extender: Dutch Gray, [name]: 236 Vital Signs Height(in): 70 Pulse(bpm): 112 Weight(lbs): 216 Blood Pressure(mmHg): 120/77 Body Mass Index(BMI): 31 Temperature(F): 98.2 Respiratory Rate(breaths/min): 18 Photos: [38:No 96 Right T - Web between 1st and 2nd Left Ischium oe] [41R:No Photos] [42:No Photos Right, Dorsal Ankle] Wound Location: [38:Gradually Appeared] [41R:Gradually Appeared] [42:Gradually Appeared] Wounding Event: [38:Inflammatory] [41R:Pressure Ulcer] [42:Venous Leg Ulcer] Primary Etiology: [38:Sleep Apnea,  Hypertension, Paraplegia Sleep Apnea, Hypertension, Paraplegia Sleep Apnea, Hypertension, Paraplegia] Comorbid History: [38:11/30/2019] [41R:03/16/2020] [42:06/06/2020] Date Acquired: [38:32] [41R:17] [42:5] Weeks of Treatment: [38:Open] [41R:Open] [42:Open] Wound Status: [38:No] [41R:Yes] [42:No] Wound Recurrence: [38:0.5x0.5x0.1] [41R:0.5x0.6x0.1] [42:0.5x0.5x0.1] Measurements L x W x D (cm) [38:0.196] [41R:0.236] [42:0.196] [name] (cm) : rea [38:0.02] [41R:0.024] [42:0.02] Volume (cm) : [38:40.60%] [41R:86.90%] [42:83.40%] % Reduction in [name] rea: [38:91.30%] [41R:86.70%] [42:83.10%] % Reduction in Volume: [38:Full Thickness Without Exposed] [41R:Category/Stage II] [42:Full Thickness Without Exposed] Classification: [38:Support Structures Small] [41R:Small] [42:Support Structures Medium] Exudate Amount: [38:Serosanguineous] [41R:Serosanguineous] [42:Serosanguineous] Exudate Type: [38:red, brown] [41R:red, brown] [42:red, brown] Exudate Color: [38:Distinct, outline attached] [41R:Distinct, outline attached] [42:Flat and Intact] Wound Margin: [38:Large (67-100%)] [41R:Large (67-100%)] [42:Large (67-100%)] Granulation Amount: [38:Red] [41R:Red, Pink] [42:Red, Friable] Granulation Quality: [38:None Present (0%)] [41R:None Present (0%)] [42:None Present (0%)] Necrotic Amount: [38:Fat Layer (Subcutaneous Tissue): Yes Fat Layer (Subcutaneous Tissue): Yes Fat Layer (Subcutaneous Tissue): Yes] Exposed Structures: [38:Large (67-100%)] [41R:Fascia: No Tendon: No Muscle: No Joint: No Bone: No Large (67-100%)] [42:Fascia: No Tendon: No Muscle: No Joint: No Bone: No Small (1-33%)] Epithelialization: [38:[name]] [41R:[name]] [42:[name]] Wound Number: [id number] [name] Photos: Photos: No Photos No Photos [name] Left, Medial Foot Left, Lateral Foot [name] Wound Location: Gradually Appeared Gradually Appeared [name] Wounding Event: Inflammatory Inflammatory [name] Primary Etiology: Sleep Apnea, Hypertension, Paraplegia Sleep Apnea,  Hypertension, Paraplegia [name] Comorbid History: 07/10/2020 07/14/2020 [name] Date Acquired: 0 0 [name] Weeks of Treatment: Open Open [name] Wound Status: No No [name] Wound Recurrence: 0.7x1.3x0.1 1.7x0.5x0.1 [name] Measurements L x W x D (cm) 0.715 0.668 [name] [name] (cm) : rea 0.071 0.067 [name] Volume (cm) : [name] [name] [name] % Reduction in [name] rea: [name] [name] [name] % Reduction in Volume: Full Thickness Without Exposed Full Thickness Without Exposed [name] Classification: Support Structures Support Structures Small Small [name] Exudate Amount: Serosanguineous Serosanguineous [name] Exudate Type: red, brown red, brown [name] Exudate Color: Distinct, outline attached Distinct, outline attached [name] Wound Margin: Large (67-100%) Large (67-100%) [name] Granulation Amount: Red, Pink Red, Pink [name] Granulation Quality: None Present (0%) None Present (0%) [name] Necrotic Amount: Fat Layer (Subcutaneous Tissue): Yes Fat Layer (Subcutaneous Tissue): Yes [name] Exposed Structures: Fascia: No Fascia: No Tendon: No Tendon: No Muscle: No Muscle: No Joint: No Joint: No Bone: No Bone: No None None [name] Epithelialization: Compression Therapy Compression Therapy [name] Procedures Performed: Treatment Notes Electronic Signature(s) Signed: 07/14/2020 4:21:43 PM By: Levan Hurst RN, BSN Signed: 07/14/2020 4:33:08 PM By: Linton Ham MD Entered By: Linton Ham on 07/14/2020 08:30:53 -------------------------------------------------------------------------------- Multi-Disciplinary Care Plan Details Patient Name: Date of Service: [place]ll[place], [name] 07/14/2020 7:30 [name] Medical Record Number: [id number] Patient Account Number: [id number] Date of Birth/Sex: Treating RN: [birth year] (34 y.o. [name] Primary Care  Ethne Jeon: [name], [name] Other Clinician: Referring Shikita Vaillancourt: Treating Janayah Zavada/Extender: [name]: 236 Active Inactive Wound/Skin Impairment Nursing Diagnoses: Impaired  tissue integrity Knowledge deficit related to ulceration/compromised skin integrity Goals: Patient/caregiver will verbalize understanding of skin care regimen Date Initiated: 01/05/2016 Target Resolution Date: 08/08/2020 Goal Status: Active Ulcer/skin breakdown will have [name] volume reduction of 30% by week 4 Date Initiated: 01/05/2016 Date Inactivated: 12/22/2017 Target Resolution Date: 01/19/2018 Unmet Reason: complex wounds, Goal Status: Unmet infection Interventions: Assess patient/caregiver ability to obtain necessary supplies Assess ulceration(s) every visit Provide education on ulcer and skin care Notes: Electronic Signature(s) Signed: 07/14/2020 4:21:43 PM By: Levan Hurst RN, BSN Entered By: Levan Hurst on 07/14/2020 08:08:59 -------------------------------------------------------------------------------- Pain Assessment Details Patient Name: Date of Service: [place]z, [name] 07/14/2020 7:30 [name] Medical Record Number: [id number] Patient Account Number: [id number] Date of Birth/Sex: Treating RN: 05-03-1986 (34 y.o. [name] Primary Care Daviyon Widmayer: [place], [place] Other Clinician: Referring Glendia Olshefski: Treating Raudel Bazen/Extender: [name]: 236 Active Problems Location of Pain Severity and Description of Pain Patient Has Paino No Site Locations Pain Management and Medication Current Pain Management: Electronic Signature(s) Signed: 07/14/2020 4:06:40 PM By: Kela Millin Entered By: Kela Millin on 07/14/2020 07:54:54 -------------------------------------------------------------------------------- Patient/Caregiver Education Details Patient Name: Date of Service: Alcorn, [name] Viviann Spare 8/23/2021andnbsp7:30 [name] Medical Record Number: [id number] Patient Account Number: [id number] Date of Birth/Gender: Treating RN: 01-08-1986 (35 y.o. [name] Primary Care Physician: Janine Limbo Other Clinician: Referring  Physician: Treating Physician/Extender: [name]: 20 Education Assessment Education Provided To: Patient Education Topics Provided Wound/Skin Impairment: Methods: Explain/Verbal Responses: State content correctly Electronic Signature(s) Signed: 07/14/2020 4:21:43 PM By: Levan Hurst RN, BSN Entered By: Levan Hurst on 07/14/2020 08:09:17 -------------------------------------------------------------------------------- Wound Assessment Details Patient Name: Date of Service: [name], [name] 07/14/2020 7:30 [name] Medical Record Number: [id number] Patient Account Number: [id number] Date of Birth/Sex: Treating RN: 11-Aug-1986 (34 y.o. [name] Primary Care Winifred Bodiford: [name], [name] Other Clinician: Referring Espyn Radwan: Treating Tobechukwu Emmick/Extender: [name]: 236 Wound Status Wound Number: [id number] Primary Etiology: Inflammatory Wound Location: Right T - Web between 1st and 2nd oe Wound Status: Open Wounding Event: Gradually Appeared Comorbid History: Sleep Apnea, Hypertension, Paraplegia Date Acquired: 11/30/2019 Weeks Of Treatment: 32 Clustered Wound: No Wound Measurements Length: (cm) 0.5 Width: (cm) 0.5 Depth: (cm) 0.1 Area: (cm) 0.196 Volume: (cm) 0.02 % Reduction in Area: 40.6% % Reduction in Volume: 91.3% Epithelialization: Large (67-100%) Tunneling: No Undermining: No Wound Description Classification: Full Thickness Without Exposed Support Structures Wound Margin: Distinct, outline attached Exudate Amount: Small Exudate Type: Serosanguineous Exudate Color: red, brown Foul Odor After Cleansing: No Slough/Fibrino No Wound Bed Granulation Amount: Large (67-100%) Exposed Structure Granulation Quality: Red Fat Layer (Subcutaneous Tissue) Exposed: Yes Necrotic Amount: None Present (0%) Treatment Notes Wound #38 (Right Toe - Web between 1st and 2nd) 2. Periwound Care Moisturizing  lotion 3. Primary Dressing Applied Calcium Alginate Ag 4. Secondary Dressing Dry Gauze Roll Gauze 6. Support Layer Applied Other support layer (specify in notes) Notes Development worker, international aid) Signed: 07/14/2020 4:06:40 PM By: Kela Millin Entered By: Kela Millin on 07/14/2020 08:04:07 -------------------------------------------------------------------------------- Wound Assessment Details Patient Name: Date of Service: [name], [name] 07/14/2020 7:30 [name] Medical Record Number: [id number] Patient Account Number: [id number] Date of Birth/Sex: Treating RN: September 16, 1986 (34 y.o. [name] Primary Care Jaxyn Rout: [place], [place] Other Clinician: Referring Leianne Callins: Treating Sadarius Norman/Extender: Malachi Carl  [name]: 236 Wound Status Wound Number: [id number] Primary Etiology: Pressure Ulcer Wound Location: Left Ischium Wound Status: Open Wounding Event: Gradually Appeared Comorbid History: Sleep Apnea, Hypertension, Paraplegia Date Acquired: 03/16/2020 Weeks Of Treatment: 17 Clustered Wound: No Wound Measurements Length: (cm) 0.5 Width: (cm) 0.6 Depth: (cm) 0.1 Area: (cm) 0.236 Volume: (cm) 0.024 % Reduction in Area: 86.9% % Reduction in Volume: 86.7% Epithelialization: Large (67-100%) Tunneling: No Undermining: No Wound Description Classification: Category/Stage II Wound Margin: Distinct, outline attached Exudate Amount: Small Exudate Type: Serosanguineous Exudate Color: red, brown Foul Odor After Cleansing: No Slough/Fibrino No Wound Bed Granulation Amount: Large (67-100%) Exposed Structure Granulation Quality: Red, Pink Fascia Exposed: No Necrotic Amount: None Present (0%) Fat Layer (Subcutaneous Tissue) Exposed: Yes Tendon Exposed: No Muscle Exposed: No Joint Exposed: No Bone Exposed: No Treatment Notes Wound #[id number] (Left Ischium) 3. Primary Dressing Applied Calcium Alginate Ag 4. Secondary Dressing Foam  Border Dressing Electronic Signature(s) Signed: 07/14/2020 4:06:40 PM By: Kela Millin Entered By: Kela Millin on 07/14/2020 08:13:04 -------------------------------------------------------------------------------- Wound Assessment Details Patient Name: Date of Service: [name], [name] 07/14/2020 7:30 [name] Medical Record Number: [id number] Patient Account Number: [id number] Date of Birth/Sex: Treating RN: 07/07/1986 (34 y.o. [name] Primary Care Michell Kader: [name], [name] Other Clinician: Referring Salah Nakamura: Treating Keenen Roessner/Extender: [name]: 236 Wound Status Wound Number: [id number] Primary Etiology: Venous Leg Ulcer Wound Location: Right, Dorsal Ankle Wound Status: Open Wounding Event: Gradually Appeared Comorbid History: Sleep Apnea, Hypertension, Paraplegia Date Acquired: 06/06/2020 Weeks Of Treatment: 5 Clustered Wound: No Wound Measurements Length: (cm) 0.5 Width: (cm) 0.5 Depth: (cm) 0.1 Area: (cm) 0.196 Volume: (cm) 0.02 % Reduction in Area: 83.4% % Reduction in Volume: 83.1% Epithelialization: Small (1-33%) Tunneling: No Undermining: No Wound Description Classification: Full Thickness Without Exposed Support Structures Wound Margin: Flat and Intact Exudate Amount: Medium Exudate Type: Serosanguineous Exudate Color: red, brown Foul Odor After Cleansing: No Slough/Fibrino No Wound Bed Granulation Amount: Large (67-100%) Exposed Structure Granulation Quality: Red, Friable Fascia Exposed: No Necrotic Amount: None Present (0%) Fat Layer (Subcutaneous Tissue) Exposed: Yes Tendon Exposed: No Muscle Exposed: No Joint Exposed: No Bone Exposed: No Treatment Notes Wound #42 (Right, Dorsal Ankle) 2. Periwound Care Moisturizing lotion 3. Primary Dressing Applied Calcium Alginate Ag 4. Secondary Dressing Dry Gauze Roll Gauze 6. Support Layer Applied Other support layer (specify in  notes) Notes Development worker, international aid) Signed: 07/14/2020 4:06:40 PM By: Kela Millin Entered By: Kela Millin on 07/14/2020 08:04:47 -------------------------------------------------------------------------------- Wound Assessment Details Patient Name: Date of Service: [name], [name] 07/14/2020 7:30 [name] Medical Record Number: [id number] Patient Account Number: [id number] Date of Birth/Sex: Treating RN: 1986/11/19 (34 y.o. [name] Primary Care Arul Farabee: [name], [name] Other Clinician: Referring Erasmus Bistline: Treating Tetsuo Coppola/Extender: [name]: 236 Wound Status Wound Number: 44 Primary Etiology: Inflammatory Wound Location: Left, Medial Foot Wound Status: Open Wounding Event: Gradually Appeared Comorbid History: Sleep Apnea, Hypertension, Paraplegia Date Acquired: 07/10/2020 Weeks Of Treatment: 0 Clustered Wound: No Wound Measurements Length: (cm) 0.7 Width: (cm) 1.3 Depth: (cm) 0.1 Area: (cm) 0.715 Volume: (cm) 0.071 % Reduction in Area: % Reduction in Volume: Epithelialization: None Tunneling: No Undermining: No Wound Description Classification: Full Thickness Without Exposed Support Structures Wound Margin: Distinct, outline attached Exudate Amount: Small Exudate Type: Serosanguineous Exudate Color: red, brown Foul Odor After Cleansing: No Slough/Fibrino No Wound Bed Granulation Amount: Large (67-100%) Exposed Structure Granulation Quality: Red, Pink Fascia Exposed: No Necrotic Amount: None Present (0%) Fat  Layer (Subcutaneous Tissue) Exposed: Yes Tendon Exposed: No Muscle Exposed: No Joint Exposed: No Bone Exposed: No Treatment Notes Wound #44 (Left, Medial Foot) 2. Periwound Care Moisturizing lotion 3. Primary Dressing Applied Calcium Alginate Ag 4. Secondary Dressing Dry Gauze 6. Support Layer Applied 3 layer compression wrap Electronic Signature(s) Signed: 07/14/2020 4:06:40  PM By: Kela Millin Entered By: Kela Millin on 07/14/2020 08:06:21 -------------------------------------------------------------------------------- Wound Assessment Details Patient Name: Date of Service: [name], [name] 07/14/2020 7:30 [name] Medical Record Number: [id number] Patient Account Number: [id number] Date of Birth/Sex: Treating RN: 03-20-1986 (34 y.o. [name] Primary Care Dean Goldner: [name], [name] Other Clinician: Referring Eliyah Mcshea: Treating Depaul Arizpe/Extender: [name]: 236 Wound Status Wound Number: 45 Primary Etiology: Inflammatory Wound Location: Left, Lateral Foot Wound Status: Open Wounding Event: Gradually Appeared Comorbid History: Sleep Apnea, Hypertension, Paraplegia Date Acquired: 07/14/2020 Weeks Of Treatment: 0 Clustered Wound: No Wound Measurements Length: (cm) 1.7 Width: (cm) 0.5 Depth: (cm) 0.1 Area: (cm) 0.668 Volume: (cm) 0.067 % Reduction in Area: % Reduction in Volume: Epithelialization: None Tunneling: No Undermining: No Wound Description Classification: Full Thickness Without Exposed Support Structures Wound Margin: Distinct, outline attached Exudate Amount: Small Exudate Type: Serosanguineous Exudate Color: red, brown Foul Odor After Cleansing: No Slough/Fibrino No Wound Bed Granulation Amount: Large (67-100%) Exposed Structure Granulation Quality: Red, Pink Fascia Exposed: No Necrotic Amount: None Present (0%) Fat Layer (Subcutaneous Tissue) Exposed: Yes Tendon Exposed: No Muscle Exposed: No Joint Exposed: No Bone Exposed: No Treatment Notes Wound #45 (Left, Lateral Foot) 2. Periwound Care Moisturizing lotion 3. Primary Dressing Applied Calcium Alginate Ag 4. Secondary Dressing Dry Gauze 6. Support Layer Applied 3 layer compression wrap Electronic Signature(s) Signed: 07/14/2020 4:06:40 PM By: Kela Millin Entered By: Kela Millin on 07/14/2020  08:07:22 -------------------------------------------------------------------------------- Vitals Details Patient Name: Date of Service: [name], [name] 07/14/2020 7:30 [name] Medical Record Number: [id number] Patient Account Number: [id number] Date of Birth/Sex: Treating RN: [birth year] (34 y.o. [name] Primary Care Elijah Phommachanh: [place], [place] Other Clinician: Referring Eliel Dudding: Treating Devlyn Retter/Extender: [name]: 236 Vital Signs Time Taken: 07:54 Temperature (F): 98.2 Height (in): 70 Pulse (bpm): 112 Weight (lbs): 216 Respiratory Rate (breaths/min): 18 Body Mass Index (BMI): 31 Blood Pressure (mmHg): 120/77 Reference Range: 80 - 120 mg / dl Electronic Signature(s) Signed: 07/14/2020 4:06:40 PM By: Kela Millin Entered By: Kela Millin on 07/14/2020 07:54:48

## 2020-07-15 NOTE — Progress Notes (Signed)
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 Problematic areas on the plantar aspect of the first second toe web space on the left and the dorsal aspect of the first second toe web space on the right. At one point I felt these were probably related to chronic fungal infections in treated him aggressively for this although we have not made any improvement here. 08/11/17; left lateral leg. Surface continues to improve with the Iodoflex although we are not seeing much improvement in overall wound dimensions. Areas on his plantar left foot and right foot show no improvement. In fact the right foot looks somewhat worse 08/18/17; left lateral leg. We changed to [facility] Blue last week after [name] prolonged course of Iodoflex which helps get the surface better. It appears that the wound with is improved. Continue with difficult areas on the left dorsal first second and plantar first second on the right  09/01/17; patient arrives in clinic today having had [name] temperature of 103 yesterday. He was seen in the ER and [facility]. The patient was concerned he could have cellulitis again in the right leg however they diagnosed him with [name] UTI and he is now on Keflex. He has [name] history of cellulitis which is been recurrent and difficult but this is been in the left leg, in the past 5 use doxycycline. He does in and out catheterizations at home which are risk factors for UTI 09/08/17; patient will be completing his Keflex this weekend. The erythema on the left leg is considerably better. He has [name] new  wound today on the medial part of the right leg small superficial almost looks like [name] skin tear. He has worsening of the area on the right dorsal first and second toe. His major area on the left lateral leg is better. Using Hydrofera Blue on all areas 09/15/17; gradual reduction in width on the long wound in the left lateral leg. No debridement required. He also has wounds on the plantar aspect of his left first second toe web space and on the dorsal aspect of the right first second toe web space. 09/22/17; there continues to be very gradual improvements in the dimensions of the left lateral leg wound. He hasn't round erythematous spot with might be pressure on his wheelchair. There is no evidence obviously of infection no purulence no warmth He has [name] dry scaled area on the plantar aspect of the left first second toe Improved area on the dorsal right first second toe. 09/29/17; left lateral leg wound continues to improve in dimensions mostly with an is still [name] fairly long but increasingly narrow wound. He has [name] dry scaled area on the plantar aspect of his left first second toe web space Increasingly concerning area on the dorsal right first second toe. In fact I am concerned today about possible cellulitis around this wound. The areas extending up his second toe and although there is deformities here almost appears to abut on the nailbed. 10/06/17; left lateral leg wound continues to make very gradual progress. Tissue culture I did from the right first second toe dorsal foot last time grew MRSA and enterococcus which was vancomycin sensitive. This was not sensitive to clindamycin or doxycycline. He is allergic to Zyvox and sulfa we have therefore arrange for him to have dalvance infusion tomorrow. He is had this in the past and tolerated it well 10/20/17; left lateral leg wound continues to make decent progress. This is certainly reduced in terms of with there is advancing epithelialization.The  cellulitis in the right foot looks better although he still has [name] deep wound in the dorsal aspect of the first second toe web space. Plantar left first toe web space on the left I think is making some progress 10/27/17; left lateral leg wound continues to make decent progress. Advancing epithelialization.using Hydrofera Blue The right first second toe web space wound is better-looking using silver alginate Improvement in the left plantar first second toe web space. Again using silver alginate 11/03/17 left lateral leg wound continues to make decent progress albeit slowly. Using [facility] The right per second toe web space continues to be [name] very problematic looking punched out wound. I obtained [name] piece of tissue for deep culture I did extensively treated this for fungus. It is difficult to imagine that this is [name] pressure area as the patient states other than going outside he doesn't really wear shoes at home The  left plantar first second toe web space looked fairly senescent. Necrotic edges. This required debridement change to [facility] Blue to all wound areas 11/10/17; left lateral leg wound continues to contract. Using Hydrofera Blue On the right dorsal first second toe web space dorsally. Culture I did of this area last week grew MRSA there is not an easy oral option in this patient was multiple antibiotic allergies or intolerances. This was only [name] rare culture isolate I'm therefore going to use Bactroban under silver alginate On the left plantar first second toe web space. Debridement is required here. This is also unchanged 11/17/17; left lateral leg wound continues to contract using Hydrofera Blue this is no longer the major issue. The major concern here is the right first second toe web space. He now has an open area going from dorsally to the plantar aspect. There is now wound on the inner lateral part of the first toe. Not [name] very viable surface on this. There is erythema spreading  medially into the forefoot. No major change in the left first second toe plantar wound 11/24/17; left lateral leg wound continues to contract using Hydrofera Blue. Nice improvement today The right first second toe web space all of this looks [name] lot less angry than last week. I have given him clindamycin and topical Bactroban for MRSA and terbinafine for the possibility of underlining tinea pedis that I could not control with ketoconazole. Looks somewhat better The area on the plantar left first second toe web space is weeping with dried debris around the wound 12/01/17; left lateral leg wound continues to contract he Hydrofera Blue. It is becoming thinner in terms of with nevertheless it is making good improvement. The right first second toe web space looks less angry but still [name] large necrotic-looking wounds starting on the plantar aspect of the right foot extending between the toes and now extensively on the base of the right second toe. I gave him clindamycin and topical Bactroban for MRSA anterior benefiting for the possibility of underlying tinea pedis. Not looking better today The area on the left first/second toe looks better. Debrided of necrotic debris 12/05/17* the patient was worked in urgently today because over the weekend he found blood on his incontinence bad when he woke up. He was found to have an ulcer by his wife who does most of his wound care. He came in today for [place] to look at this. He has not had [name] history of wounds in his buttocks in spite of his paraplegia. 12/08/17; seen in follow-up today at his usual appointment. He was seen earlier this week and found to have [name] new wound on his buttock. We also follow him for wounds on the left lateral leg, left first second toe web space and right first second toe web space 12/15/17; we have been using Hydrofera Blue to the left lateral leg which has improved. The right first second toe web space has also improved. Left first second toe web  space plantar aspect looks stable. The left buttock has worsened using Santyl. Apparently the buttock has drainage 12/22/17; we have been using Hydrofera Blue to the left lateral leg which continues to improve now 2 small wounds separated by normal skin. He tells [place] he had [name] fever up to 100 yesterday he is prone to UTIs but has not noted anything different. He does in and out catheterizations. The area between the first and second toes today does not look good necrotic surface covered with what looks  to be purulent drainage and erythema extending into the third toe. I had gotten this to something that I thought look better last time however it is not look good today. He also has [name] necrotic surface over the buttock wound which is expanded. I thought there might be infection under here so I removed [name] lot of the surface with [name] #5 curet though nothing look like it really needed culturing. He is been using Santyl to this area 12/27/17; his original wound on the left lateral leg continues to improve using Hydrofera Blue. I gave him samples of Baxdella although he was unable to take them out of fear for an allergic reaction ["lump in his throat"].the culture I did of the purulent drainage from his second toe last week showed both enterococcus and [name] set Enterobacter I was also concerned about the erythema on the bottom of his foot although paradoxically although this looks somewhat better today. 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 We've been using silver alginate here. They came up today that he sleeps in [name] recliner, I'm not sure why but I asked him to stop this 01/03/18; his original wound we've been using Hydrofera Blue is now separated into 2 areas. Ulcer on his left buttock is better he is off the recliner and sleeping in bed Finally both wound areas between his first and second toes also looks some  better 01/10/18; his original wound on the left lateral leg is now separated into 2 wounds we've been using Hydrofera Blue Ulcer on his left buttock has some drainage. There is [name] small probing site going into muscle layer superiorly.using silver alginate -He arrives today with [name] deep tissue injury on the left heel The wound on the dorsal aspect of his first second toe on the left looks [name] lot betterusing silver alginate ketoconazole The area on the first second toe web space on the right also looks [name] lot bette 01/17/18; his original wound on the left lateral leg continues to progress using Hydrofera Blue Ulcer on his left buttock also is smaller surface healthier except for [name] small probing site going into the muscle layer superiorly. 2.4 cm of tunneling in this area DTI on his left heel we have only been offloading. Looks better than last week no threatened open no evidence of infection the wound on the dorsal aspect of the first second toe on the left continues to look like it's regressing we have only been using silver alginate and terbinafine orally The area in the first second toe web space on the right also looks to be [name] lot better using silver alginate and terbinafine I think this was prompted by tinea pedis 01/31/18; the patient was hospitalized in [place] last week apparently for [name] complicated UTI. He was discharged on cefepime he does in and out catheterizations. In the hospital he was discovered M I don't mild elevation of AST and ALT and the terbinafine was stopped.predictably the pressure ulcer on s his buttock looks betterusing silver alginate. The area on the left lateral leg also is better using Hydrofera Blue. The area between the first and second toes on the left better. First and second toes on the right still substantial but better. Finally the DTI on the left heel has held together and looks like it's resolving 02/07/18-he is here in follow-up evaluation for multiple ulcerations.  He has new injury to the lateral aspect of  the last issue [name] pressure ulcer, he states this is from adhesive removal trauma. He states he has tried multiple adhesive products with no success. All other ulcers appear stable. The left heel DTI is resolving. We will continue with same treatment plan and follow-up next week. 02/14/18; follow-up for multiple areas. He has [name] new area last week on the lateral aspect of his pressure ulcer more over the posterior trochanter. The original pressure ulcer looks quite stable has healthy granulation. 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 Almost fully epithelialized on the original superior area using Hydrofera Blue DTI on the left heel has peeled off this week to reveal [name] small superficial wound under denuded skin and subcutaneous tissue Both areas between the first and second toes look better including nothing open on the left 02/21/18; The patient's wounds on his left ischial tuberosity and posterior left greater trochanter actually looked better. He has [name] large area of irritation around the area which I think is contact dermatitis. I am doubtful that this is fungal His original wound on the left lateral calf continues to improve we have been using Hydrofera Blue There is no open area in the left first second toe web space although there is [name] lot of thick callus The DTI on the left heel required debridement today of necrotic surface eschar and subcutaneous tissue using silver alginate Finally the area on the right first second toe webspace continues to contract using silver alginate and ketoconazole 02/28/18 Left ischial tuberosity wounds look better using silver alginate. Original wound on the left calf only has one small open area left using Hydrofera Blue DTI on the left heel required debridement mostly removing skin from around this wound surface. Using  silver alginate The areas on the right first/second toe web space using silver alginate and ketoconazole 03/08/18 on evaluation today patient appears to be doing decently well as best I can tell in regard to his wounds. This is the first time that I have seen him as he generally is followed by Dr. Dellia Nims. With that being said none of his wounds appear to be infected he does have an area where there is some skin covering what appears to be [name] new wound on the left dorsal surface of his great toe. This is right at the nail bed. With that being said I do believe that debrided away some of the excess skin can be of benefit in this regard. Otherwise he has been tolerating the dressing changes without complication. 03/14/18; patient arrives today with the multiplicity of wounds that we are following. He has not been systemically unwell Original wound on the left lateral calf now only has 2 small open areas we've been using Hydrofera Blue which should continue The deep tissue injury on the left heel requires debridement today. We've been using silver alginate The left first second toe and the right first second toe are both are reminiscence what I think was tinea pedis. Apparently some of the callus Surface between the toes was removed last week when it started draining. Purulent drainage coming from the wound on the ischial tuberosity on the left. 03/21/18-He is here in follow-up evaluation for multiple wounds. 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 He tolerated debridement. The only change to last week's recommendations is to discontinue antifungal cream between toes. He will follow-up next week  03/28/18; following up for multiple wounds;Concern this week is streaking redness and swelling in the right foot. He is going to need antibiotics for this. 03/31/18; follow-up for right foot cellulitis. Streaking redness and swelling in the  right foot on 03/28/18. 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 I put him on clindamycin 300 mg every 6 and brought him in for [name] quick check. He has an open wound between his first and second toes on the right foot as [name] potential source. 04/04/18; Right foot cellulitis is resolving he is completing clindamycin. This is truly good news Left lateral calf wound which is initial wound only has one small open area inferiorly this is close to healing out. He has compression stockings. We will use Hydrofera Blue right down to the epithelialization of this Nonviable surface on the left heel which was initially pressure with [name] DTI. We've been using Hydrofera Blue. I'm going to switch this back to silver alginate Left first second toe/tinea pedis this looks better using silver alginate Right first second toe tinea pedis using silver alginate Large pressure ulcers on theLeft ischial tuberosity. Small wound here Looks better. I am uncertain about the surface over the large wound. Using silver alginate 04/11/18; Cellulitis in the right foot is resolved Left lateral calf wound which was his original wounds still has 2 tiny open areas remaining this is just about closed Nonviable surface on the left heel is better but still requires debridement Left first second toe/tinea pedis still open using silver alginate Right first second toe wound tinea pedis I asked him to go back to using ketoconazole and silver alginate Large pressure ulcers on the left ischial tuberosity this shear injury here is resolved. Wound is smaller. No evidence of infection using silver alginate 04/18/18; Patient arrives with an intense area of cellulitis in the right mid lower calf extending into the right heel area. Bright red and warm. Smaller area on the left anterior leg. He has [name] significant history of MRSA. He will definitely need antibioticsdoxycycline He now has 2 open areas on the left ischial tuberosity the  original large wound and now [name] satellite area which I think was above his initial satellite areas. Not [name] wonderful surface on this satellite area surrounding erythema which looks like pressure related. His left lateral calf wound again his original wound is just about closed Left heel pressure injury still requiring debridement Left first second toe looks [name] lot better using silver alginate Right first second toe also using silver alginate and ketoconazole cream also looks better 04/20/18; the patient was worked in early today out of concerns with his cellulitis on the right leg. I had started him on doxycycline. This was 2 days ago. His wife was concerned about the swelling in the area. Also concerned about the left buttock. He has not been systemically unwell no fever chills. No nausea vomiting or diarrhea 04/25/18; the patient's left buttock wound is continued to deteriorate he is using Hydrofera Blue. He is still completing clindamycin for the cellulitis on the right leg although all of this looks better. 05/02/18 Left buttock wound still with [name] lot of drainage and [name] very tightly adherent fibrinous necrotic surface. He has [name] deeper area superiorly The left lateral calf wound is still closed DTI wound on the left heel necrotic surface especially the circumference using Iodoflex Areas between his left first second toe and right first second toe both look better. Dorsally and the right first second toe he  had [name] necrotic surface although at smaller. In using silver alginate and ketoconazole. I did [name] culture last week which was [name] deep tissue culture of the reminiscence of the open wound on the right first second toe dorsally. This grew [name] few Acinetobacter and [name] few methicillin-resistant staph aureus. Nevertheless the area actually this week looked better. I didn't feel the need to specifically address this at least in terms of systemic antibiotics. 05/09/18; wounds are measuring larger more drainage per  our intake. We are using Santyl covered with alginate on the large superficial buttock wounds, Iodosorb on the left heel, ketoconazole and silver alginate to the dorsal first and second toes bilaterally. 05/16/18; The area on his left buttock better in some aspects although the area superiorly over the ischial tuberosity required an extensive debridement.using Santyl Left heel appears stable. Using Iodoflex The areas between his first and second toes are not bad however there is spreading erythema up the dorsal aspect of his left foot this looks like cellulitis again. He is insensate the erythema is really very brilliant.o Erysipelas He went to see an allergist days ago because he was itching part of this he had lab work done. This showed [name] white count of 15.1 with 70% neutrophils. Hemoglobin of 11.4 and [name] platelet count of 659,000. Last white count we had in Epic was [name] 2-1/2 years ago which was 25.9 but he was ill at the time. He was able to show me some lab work that was done by his primary physician the pattern is about the same. 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 But prompted me to go ahead and do x-rays of both feet and the pelvis rule out osteomyelitis. He also had [name] comprehensive metabolic panel this was reasonably normal his albumin was 3.7 liver function tests BUN/creatinine all normal 05/23/18; x-rays of both his feet from last week were negative for underlying pulmonary abnormality. The x-ray of his pelvis however showed mild irregularity in the left ischial which may represent some early osteomyelitis. The wound in the left ischial continues to get deeper clearly now exposed muscle. Each week necrotic surface material over this area. Whereas the rest of the wounds do not look so bad. The left ischial wound we have been using Santyl and calcium alginate T the left heel surface necrotic debris using Iodoflex o The left lateral leg is still  healed Areas on the left dorsal foot and the right dorsal foot are about the same. There is some inflammation on the left which might represent contact dermatitis, fungal dermatitis I am doubtful cellulitis although this looks better than last week 05/30/18; CT scan done at Hospital did not show any osteomyelitis or abscess. Suggested the possibility of underlying cellulitis although I don't see [name] lot of evidence of this at the bedside The wound itself on the left buttock/upper thigh actually looks somewhat better. No debridement Left heel also looks better no debridement continue Iodoflex Both dorsal first second toe spaces appear better using Lotrisone. Left still required debridement 06/06/18; Intake reported some purulent looking drainage from the left gluteal wound. Using Santyl and calcium alginate Left heel looks better although still [name] nonviable surface requiring debridement The left dorsal foot first/second webspace actually expanding and somewhat deeper. I may consider doing [name] shave biopsy of this area Right dorsal foot first/second webspace appears stable to improved. Using Lotrisone and silver alginate to both these areas 06/13/18 Left gluteal surface looks  better. Now separated in the 2 wounds. No debridement required. Still drainage. We'll continue silver alginate Left heel continues to look better with Iodoflex continue this for at least another week Of his dorsal foot wounds the area on the left still has some depth although it looks better than last week. We've been using Lotrisone and silver alginate 06/20/18 Left gluteal continues to look better healthy tissue Left heel continues to look better healthy granulation wound is smaller. He is using Iodoflex and his long as this continues continue the Iodoflex Dorsal right foot looks better unfortunately dorsal left foot does not. There is swelling and erythema of his forefoot. He had minor trauma to this several days ago but doesn't  think this was enough to have caused any tissue injury. Foot looks like cellulitis, we have had this problem before 06/27/18 on evaluation today patient appears to be doing [name] little worse in regard to his foot ulcer. 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 Both of which would really be his only options for treating this infection. In the past he has been given and effusion of Orbactiv. This is done very well for him in the past again it's one time dosing IV antibiotic therapy. Subsequently I do believe this is something we're gonna need to see about doing at this point in time. Currently his other wounds seem to be doing somewhat better in my pinion I'm pretty happy in that regard. 07/03/18 on evaluation today patient's wounds actually appear to be doing fairly well. He has been tolerating the dressing changes without complication. All in all he seems to be showing signs of improvement. In regard to the antibiotics he has been dealing with infectious disease since I saw him last week as far as getting this scheduled. In the end he's going to be [facility] this coming Friday. In the meantime he has been continuing to perform the dressing changes in such as previous. There does not appear to be any evidence of infection worsengin at this time. 07/10/18; Since I last saw this man 2 [name] ago things have actually improved. IV antibiotics of resulted in less forefoot erythema although there is still some present. He is not systemically unwell Left buttock wounds 2 now have no depth there is increased epithelialization Using silver alginate Left heel still requires debridement using Iodoflex Left dorsal foot still with [name] sizable wound about the size of [name] border but healthy granulation Right dorsal foot still with [name]  slitlike area using silver alginate 07/18/18; the patient's cellulitis in the left foot is improved in fact I think it is on its way to resolving. Left buttock wounds 2 both look better although the larger one has hypertension granulation we've been using silver alginate Left heel has some thick circumferential redundant skin over the wound edge which will need to be removed today we've been using Iodoflex Left dorsal foot is still [name] sizable wound required debridement using silver alginate The right dorsal foot is just about closed only [name] small open area remains here 07/25/18; left foot cellulitis is resolved Left buttock wounds 2 both look better. Hyper-granulation on the major area Left heel as some debris over the surface but otherwise looks [name] healthier wound. Using silver collagen Right dorsal foot is just about closed 07/31/18; arrives with  our intake nurse worried about purulent drainage from the buttock. We had hyper-granulation here last week His buttock wounds 2 continue to look better Left heel some debris over the surface but measuring smaller. Right dorsal foot unfortunately has openings between the toes Left foot superficial wound looks less aggravated. 08/07/18 Buttock wounds continue to look better although some of her granulation and the larger medial wound. silver alginate Left heel continues to look [name] lot better.silver collagen Left foot superficial wound looks less stable. Requires debridement. He has [name] new wound superficial area on the foot on the lateral dorsal foot. Right foot looks better using silver alginate without Lotrisone 08/14/2018; patient was in the ER last week diagnosed with [name] UTI. He is now on Cefpodoxime and Macrodantin. Buttock wounds continued to be smaller. Using silver alginate Left heel continues to look better using silver collagen Left foot superficial wound looks as though it is improving Right dorsal foot area is just about healed. 08/21/2018; patient is  completed his antibiotics for his UTI. He has 2 open areas on the buttocks. There is still not closed although the surface looks satisfactory. Using silver alginate Left heel continues to improve using silver collagen The bilateral dorsal foot areas which are at the base of his first and second toes/possible tinea pedis are actually stable on the left but worse on the right. The area on the left required debridement of necrotic surface. After debridement I obtained [name] specimen for PCR culture. The right dorsal foot which is been just about healed last week is now reopened 08/28/2018; culture done on the left dorsal foot showed coag negative staph both staph epidermidis and Lugdunensis. I think this is worthwhile initiating systemic treatment. I will use doxycycline given his long list of allergies. The area on the left heel slightly improved but still requiring debridement. The large wound on the buttock is just about closed whereas the smaller one is larger. Using silver alginate in this area 09/04/2018; patient is completing his doxycycline for the left foot although this continues to be [name] very difficult wound area with very adherent necrotic debris. We are using silver alginate to all his wounds right foot left foot and the small wounds on his buttock, silver collagen on the left heel. 09/11/2018; once again this patient has intense erythema and swelling of the left forefoot. Lesser degrees of erythema in the right foot. He has [name] long list of allergies and intolerances. I will reinstitute doxycycline. 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 Using silver alginate Left heel also looks better using silver collagen Unfortunately both the areas on his feet look worse. The area on the left first second webspace is now gone through to the plantar part of his foot. The area on the left foot anteriorly is irritated with erythema and swelling in the  forefoot. 09/25/2018 His wound on the left plantar heel looks better. Using silver collagen The area on the left buttock 2 small remnant areas. One is closed one is still open. Using silver alginate The areas between both his first and second toes look worse. This in spite of long-standing antifungal therapy with ketoconazole and silver alginate which should have antifungal activity He has small areas around his original wound on the left calf one is on the bottom of the original scar tissue and one superiorly both of these are small and superficial but again given wound history in this site this is  worrisome 10/02/2018 Left plantar heel continues to gradually contract using silver collagen Left buttock wound is unchanged using silver alginate The areas on his dorsal feet between his first and second toes bilaterally look about the same. I prescribed clindamycin ointment to see if we can address chronic staph colonization and also the underlying possibility of erythrasma The left lateral lower extremity wound is actually on the lateral part of his ankle. Small open area here. We have been using silver alginate 10/09/2018; Left plantar heel continues to look healthy and contract. No debridement is required Left buttock slightly smaller with [name] tape injury wound just below which was new this week Dorsal feet somewhat improved I have been using clindamycin Left lateral looks lower extremity the actual open area looks worse although [name] lot of this is epithelialized. I am going to change to silver collagen today He has [name] lot more swelling in the right leg although this is not pitting not red and not particularly warm there is [name] lot of spasm in the right leg usually indicative of people with paralysis of some underlying discomfort. We have reviewed his vascular status from 2017 he had [name] left greater saphenous vein ablation. I wonder about referring him back to vascular surgery if the area on the left  leg continues to deteriorate. 10/16/2018 in today for follow-up and management of multiple lower extremity ulcers. His left Buttock wound is much lower smaller and almost closed completely. The wound to the left ankle has began to reopen with Epithelialization and some adherent slough. He has multiple new areas to the left foot and leg. The left dorsal foot without much improvement. Wound present between left great webspace and 2nd toe. Erythema and edema present right leg. Right LE ultrasound obtained on 10/10/18 was negative for DVT . 10/23/2018; Left buttock is closed over. Still dry macerated skin but there is no open wound. I suspect this is chronic pressure/moisture Left lateral calf is quite [name] bit worse than when I saw this last. There is clearly drainage here he has macerated skin into the left plantar heel. We will change the primary dressing to alginate Left dorsal foot has some improvement in overall wound area. Still using clindamycin and silver alginate Right dorsal foot about the same as the left using clindamycin and silver alginate The erythema in the right leg has resolved. He is DVT rule out was negative Left heel pressure area required debridement although the wound is smaller and the surface is health 10/26/2018 The patient came back in for his nurse check today predominantly because of the drainage coming out of the left lateral leg with [name] recent reopening of his original wound on the left lateral calf. He comes in today with [name] large amount of surrounding erythema around the wound extending from the calf into the ankle and even in the area on the dorsal foot. He is not systemically unwell. He is not febrile. Nevertheless this looks like cellulitis. We have been using silver alginate to the area. I changed him to [name] regular visit and I am going to prescribe him doxycycline. The rationale here is [name] long list of medication intolerances and [name] history of MRSA. I did not see anything  that I thought would provide [name] valuable culture 10/30/2018 Follow-up from his appointment 4 days ago with really an extensive area of cellulitis in the left calf left lateral ankle and left dorsal foot. I put him on doxycycline. He has [name] long list of medication allergies  which are true allergy reactions. 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 In any case he is [name] lot better today. The erythema especially in the anterior and lateral left calf is better. He still has left ankle erythema. He also is complaining about increasing edema in the right leg we have only been using Kerlix Coban and he has been doing the wraps at home. Finally he has [name] spotty rash on the medial part of his upper left calf which looks like folliculitis or perhaps wrap occlusion type injury. Small superficial macules not pustules 11/06/18 patient arrives today with again [name] considerable degree of erythema around the wound on the left lateral calf extending into the dorsal ankle and dorsal foot. This is [name] lot worse than when I saw this last week. He is on doxycycline really with not [name] lot of improvement. He has not been systemically unwell Wounds on the; left heel actually looks improved. Original area on the left foot and proximity to the first and second toes looks about the same. He has superficial areas on the dorsal foot, anterior calf and then the reopening of his original wound on the left lateral calf which looks about the same The only area he has on the right is the dorsal webspace first and second which is smaller. He has [name] large area of dry erythematous skin on the left buttock small open area here. 11/13/2018; the patient arrives in much better condition. The erythema around the wound on the left lateral calf is [name] lot better. 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 In any case this is [name] lot better. The area on the left heel is very small and just about resolved using silver collagen we have been using silver alginate to the areas on his dorsal feet 11/20/2018; his wounds include the left lateral calf, left heel, dorsal aspects of both feet just proximal to the first second webspace. He is stable to slightly improved. I did not think any changes to his dressings were going to be necessary 11/27/2018 he has [name] reopening on the left buttock which is surrounded by what looks like tinea or perhaps some other form of dermatitis. The area on the left dorsal foot has some erythema around it I have marked this area but I am not sure whether this is cellulitis or not. Left heel is not closed. Left calf the reopening is really slightly longer and probably worse 1/13; in general things look better and smaller except for the left dorsal foot. Area on the left heel is just about closed, left buttock looks better only [name] small wound remains in the skin looks better [using Lotrisone] 1/20; the area on the left heel only has [name] few remaining open areas here. Left lateral calf about the same in terms of size, left dorsal foot slightly larger right lateral foot still not closed. The area on the left buttock has no open wound and the surrounding skin looks [name] lot better 1/27; the area on the left heel is closed. Left lateral calf better but still requiring extensive debridements. The area on his left buttock is closed. He still has the open areas on the left dorsal foot which is slightly smaller in the right foot which is slightly expanded. We have been using Iodoflex on these areas as well 2/3; left heel is  closed. Left lateral calf still requiring debridement using Iodoflex there is no open area on his left buttock however he has dry scaly skin over [name] large area of this. Not really responding well to the Lotrisone. Finally the areas on his dorsal feet at the level of the  first second webspace are slightly smaller on the right and about the same on the left. Both of these vigorously debrided with Anasept and gauze 2/10; left heel remains closed he has dry erythematous skin over the left buttock but there is no open wound here. Left lateral leg has come in and with. Still requiring debridement we have been using Iodoflex here. Finally the area on the left dorsal foot and right dorsal foot are really about the same extremely dry callused fissured areas. He does not yet have [name] dermatology appointment 2/17; left heel remains closed. He has [name] new open area on the left buttock. The area on the left lateral calf is bigger longer and still covered in necrotic debris. No major change in his foot areas bilaterally. I am awaiting for [name] dermatologist to look on this. We have been using ketoconazole I do not know that this is been doing any good at all. 2/24; left heel remains closed. The left buttock wound that was new reopening last week looks better. The left lateral calf appears better also although still requires debridement. The major area on his foot is the left first second also requiring debridement. We have been putting Prisma on all wounds. I do not believe that the ketoconazole has done too much good for his feet. He will use Lotrisone I am going to give him [name] 2-week course of terbinafine. We still do not have [name] dermatology appointment 3/2 left heel remains closed however there is skin over bone in this area I pointed this out to him today. The left buttock wound is epithelialized but still does not look completely stable. The area on the left leg required debridement were using silver collagen here. With regards to his feet we changed to Lotrisone last week and silver alginate. 3/9; left heel remains closed. Left buttock remains closed. The area on the right foot is essentially closed. The left foot remains unchanged. Slightly smaller on the left lateral calf. Using  silver collagen to both of these areas 3/16-Left heel remains closed. Area on right foot is closed. Left lateral calf above the lateral malleolus open wound requiring debridement with easy bleeding. Left dorsal wound proximal to first toe also debrided. Left ischial area open new. Patient has been using Prisma with wrapping every 3 days. Dermatology appointment is apparently tomorrow.Patient has completed his terbinafine 2-week course with some apparent improvement according to him, there is still flaking and dry skin in his foot on the left 3/23; area on the right foot is reopened. The area on the left anterior foot is about the same still [name] very necrotic adherent surface. He still has the area on the left leg and reopening is on the left buttock. He apparently saw dermatology although I do not have [name] note. According to the patient who is usually fairly well informed they did not have any good ideas. Put him on oral terbinafine which she is been on before. 3/30; using silver collagen to all wounds. Apparently his dermatologist put him on doxycycline and rifampin presumably some culture grew staph. I do not have this result. He remains on terbinafine although I have used terbinafine on him before 4/6; patient has had  [name] fairly substantial reopening on the right foot between the first and second toes. He is finished his terbinafine and I believe is on doxycycline and rifampin still as prescribed by dermatology. We have been using silver collagen to all his wounds although the patient reports that he thinks silver alginate does better on the wounds on his buttock. 4/13; the area on his left lateral calf about the same size but it did not require debridement. Left dorsal foot just proximal to the webspace between the first and second toes is about the same. Still nonviable surface. I note some superficial bronze discoloration of the dorsal part of his foot Right dorsal foot just proximal to the first  and second toes also looks about the same. I still think there may be the same discoloration I noted above on the left Left buttock wound looks about the same 4/20; left lateral calf appears to be gradually contracting using silver collagen. 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 The left dorsal foot wound is debrided of tightly adherent necrotic debris and really cleans up quite nicely. The right area is worse with expansion. I did not debride this it is now over the base of the second toe The area on his left buttock is smaller no debridement is required using silver collagen 5/4; left calf continues to make good progress. He arrives with erythema around the wounds on his dorsal foot which even extends to the plantar aspect. Very concerning for coexistent infection. He is finished the erythromycin I gave him for possible erythrasma this does not seem to have helped. The area on the left foot is about the same base of the dorsal toes Is area on the buttock looks improved on the left 5/11; left calf and left buttock continued to make good progress. Left foot is about the same to slightly improved. Major problem is on the right foot. He has not had an x-ray. Deep tissue culture I did last week showed both Enterobacter and E. coli. I did not change the doxycycline I put him on empirically although neither 1 of these were plated to doxycycline. He arrives today with the erythema looking worse on both the dorsal and plantar foot. Macerated skin on the bottom of the foot. he has not been systemically unwell 5/18-Patient returns at 1 week, left calf wound appears to be making some progress, left buttock wound appears slightly worse than last time, left foot wound looks slightly better, right foot redness is marginally better. X-ray of both feet show no air or evidence of osteomyelitis. Patient is finished his Omnicef and terbinafine. He  continues to have macerated skin on the bottom of the left foot as well as right 5/26; left calf wound is better, left buttock wound appears to have multiple small superficial open areas with surrounding macerated skin. X-rays that I did last time showed no evidence of osteomyelitis in either foot. He is finished cefdinir and doxycycline. I do not think that he was on terbinafine. He continues to have [name] large superficial open area on the right foot anterior dorsal and slightly between the first and second toes. I did send him to dermatology 2 months ago or so wondering about whether they would do [name] fungal scraping. I do not believe they did but did do [name] culture. We have been using silver alginate to the toe areas, he has been using antifungals at home topically either ketoconazole or Lotrisone. We are using silver  collagen on the left foot, silver alginate on the right, silver collagen on the left lateral leg and silver alginate on the left buttock 6/1; left buttock area is healed. We have the left dorsal foot, left lateral leg and right dorsal foot. We are using silver alginate to the areas on both feet and silver collagen to the area on his left lateral calf 6/8; the left buttock apparently reopened late last week. He is not really sure how this happened. He is tolerating the terbinafine. Using silver alginate to all wounds 6/15; left buttock wound is larger than last week but still superficial. Came in the clinic today with [name] report of purulence from the left lateral leg I did not identify any infection Both areas on his dorsal feet appear to be better. He is tolerating the terbinafine. Using silver alginate to all wounds 6/22; left buttock is about the same this week, left calf quite [name] bit better. His left foot is about the same however he comes in with erythema and warmth in the right forefoot once again. Culture that I gave him in the beginning of May showed Enterobacter and E. coli. I gave him  doxycycline and things seem to improve although neither 1 of these organisms was specifically plated. 6/29; left buttock is larger and dry this week. Left lateral calf looks to me to be improved. Left dorsal foot also somewhat improved right foot completely unchanged. The erythema on the right foot is still present. He is completing the Ceftin dinner that I gave him empirically [see discussion above.) 7/6 - All wounds look to be stable and perhaps improved, the left buttock wound is slightly smaller, per patient bleeds easily, completed ceftin, the right foot redness is less, he is on terbinafine 7/13; left buttock wound about the same perhaps slightly narrower. Area on the left lateral leg continues to narrow. Left dorsal foot slightly smaller right foot about the same. We are using silver alginate on the right foot and Hydrofera Blue to the areas on the left. Unna boot on the left 2 layer compression on the right 7/20; left buttock wound absolutely the same. Area on lateral leg continues to get better. Left dorsal foot require debridement as did the right no major change in the 7/27; left buttock wound the same size necrotic debris over the surface. The area on the lateral leg is closed once again. His left foot looks better right foot about the same although there is some involvement now of the posterior first second toe area. He is still on terbinafine which I have given him for [name] month, not certain [name] centimeter major change 06/25/19-All wounds appear to be slightly improved according to report, left buttock wound looks clean, both foot wounds have minimal to no debris the right dorsal foot has minimal slough. We are using Hydrofera Blue to the left and silver alginate to the right foot and ischial wound. 8/10-Wounds all appear to be around the same, the right forefoot distal part has some redness which was not there before, however the wound looks clean and small. Ischial wound looks about the  same with no changes 8/17; his wound on the left lateral calf which was his original chronic venous insufficiency wound remains closed. Since I last saw him the areas on the left dorsal foot right dorsal foot generally appear better but require debridement. The area on his left initial tuberosity appears somewhat larger to me perhaps hyper granulated and bleeds very easily. We have been  using Hydrofera Blue to the left dorsal foot and silver alginate to everything else 8/24; left lateral calf remains closed. The areas on his dorsal feet on the webspace of the first and second toes bilaterally both look better. The area on the left buttock which is the pressure ulcer stage II slightly smaller. I change the dressing to Hydrofera Blue to all areas 8/31; left lateral calf remains closed. The area on his dorsal feet bilaterally look better. Using Hydrofera Blue. Still requiring debridement on the left foot. No change in the left buttock pressure ulcers however 9/14; left lateral calf remains closed. Dorsal feet look quite [name] bit better than 2 [name] ago. Flaking dry skin also [name] lot better with the ammonium lactate I gave him 2 [name] ago. The area on the left buttock is improved. He states that his Roho cushion developed [name] leak and he is getting [name] new one, in the interim he is offloading this vigorously 9/21; left calf remains closed. Left heel which was [name] possible DTI looks better this week. He had macerated tissue around the left dorsal foot right foot looks satisfactory and improved left buttock wound. I changed his dressings to his feet to silver alginate bilaterally. Continuing Hydrofera Blue on the left buttock. 9/28 left calf remains closed. Left heel did not develop anything [possible DTI] dry flaking skin on the left dorsal foot. Right foot looks satisfactory. Improved left buttock wound. We are using silver alginate on his feet Hydrofera Blue on the buttock. I have asked him to go back to the  Lotrisone on his feet including the wounds and surrounding areas 10/5; left calf remains closed. The areas on the left and right feet about the same. [name] lot of this is epithelialized however debris over the remaining open areas. He is using Lotrisone and silver alginate. The area on the left buttock using Hydrofera Blue 10/26. Patient has been out for 3 [name] secondary to Covid concerns. He tested negative but I think his wife tested positive. He comes in today with the left foot substantially worse, right foot about the same. Even more concerning he states that the area on his left buttock closed over but then reopened and is considerably deeper in one aspect than it was before [stage III wound] 11/2; left foot really about the same as last week. Quarter sized wound on the dorsal foot just proximal to the first second toes. Surrounding erythema with areas of denuded epithelium. This is not really much different looking. Did not look like cellulitis this time however. Right foot area about the same.. We have been using silver alginate alginate on his toes Left buttock still substantial irritated skin around the wound which I think looks somewhat better. We have been using Hydrofera Blue here. 11/9; left foot larger than last week and [name] very necrotic surface. Right foot I think is about the same perhaps slightly smaller. Debris around the circumference also addressed. Unfortunately on the left buttock there is been [name] decline. Satellite lesions below the major wound distally and now [name] an additional one posteriorly we have been using Hydrofera Blue but I think this is [name] pressure issue 11/16; left foot ulcer dorsally again [name] very adherent necrotic surface. Right foot is about the same. Not much change in the pressure ulcer on his left buttock. 11/30; left foot ulcer dorsally basically the same as when I saw him 2 [name] ago. Very adherent fibrinous debris on the wound surface. Patient reports [name] lot  of  drainage as well. 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 We have been using Iodoflex, patient changed back to alginate because of the drainage. Area on his right dorsal foot really looks benign with [name] healthier surface certainly [name] lot better than on the left. Left buttock wounds all improved using Hydrofera Blue 12/7; left dorsal foot again no improvement. Tightly adherent debris. PCR culture I did last week only showed likely skin contaminant. I have gone ahead and done [name] punch biopsy of this which is about the last thing in terms of investigations I can think to do. He has known venous insufficiency and venous hypertension and this could be the issue here. The area on the right foot is about the same left buttock slightly worse according to our intake nurse secondary to [facility] Blue sticking to the wound 12/14; biopsy of the left foot that I did last time showed changes that could be related to wound healing/chronic stasis dermatitis phenomenon no neoplasm. We have been using silver alginate to both feet. I change the one on the left today to Sorbact and silver alginate to his other 2 wounds 12/28; the patient arrives with the following problems; Major issue is the dorsal left foot which continues to be [name] larger deeper wound area. Still with [name] completely nonviable surface Paradoxically the area mirror image on the right on the right dorsal foot appears to be getting better. He had some loss of dry denuded skin from the lower part of his original wound on the left lateral calf. Some of this area looked [name] little vulnerable and for this reason we put him in wrap that on this side this week The area on his left buttock is larger. He still has the erythematous circular area which I think is [name] combination of pressure, sweat. This does not look like cellulitis or fungal dermatitis 11/26/2019; -Dorsal left foot large open wound with depth. Still  debris over the surface. Using Sorbact The area on the dorsal right foot paradoxically has closed over He has [name] reopening on the left ankle laterally at the base of his original wound that extended up into the calf. This appears clean. The left buttock wound is smaller but with very adherent necrotic debris over the surface. We have been using silver alginate here as well The patient had arterial studies done in 2017. He had biphasic waveforms at the dorsalis pedis and posterior tibial bilaterally. ABI in the left was 1.17. Digit waveforms were dampened. He has slight spasticity in the great toes I do not think [name] TBI would be possible 1/11; the patient comes in today with [name] sizable reopening between the first and second toes on the right. This is not exactly in the same location where we have been treating wounds previously. According to our intake nurse this was actually fairly deep but 0.6 cm. The area on the left dorsal foot looks about the same the surface is somewhat cleaner using Sorbact, his MRI is in 2 days. We have not managed yet to get arterial studies. The new reopening on the left lateral calf looks somewhat better using alginate. The left buttock wound is about the same using alginate 1/18; the patient had his ARTERIAL studies which were quite normal. ABI in the right at 1.13 with triphasic/biphasic waveforms on the left ABI 1.06 again with triphasic/biphasic waveforms. It would not have been possible to have done [name] toe brachial index because of spasticity. We have  been using Sorbac to the left foot alginate to the rest of his wounds on the right foot left lateral calf and left buttock 1/25; arrives in clinic with erythema and swelling of the left forefoot worse over the first MTP area. This extends laterally dorsally and but also posteriorly. Still has an area on the left lateral part of the lower part of his calf wound it is eschared and clearly not closed. Area on the left buttock  still with surrounding irritation and erythema. Right foot surface wound dorsally. The area between the right and first and second toes appears better. 2/1; The left foot wound is about the same. Erythema slightly better I gave him [name] week of doxycycline empirically Right foot wound is more extensive extending between the toes to the plantar surface Left lateral calf really no open surface on the inferior part of his original wound however the entire area still looks vulnerable Absolutely no improvement in the left buttock wound required debridement. 2/8; the left foot is about the same. Erythema is slightly improved I gave him clindamycin last week. Right foot looks better he is using Lotrimin and silver alginate He has [name] breakdown in the left lateral calf. Denuded epithelium which I have removed Left buttock about the same were using Hydrofera Blue 2/15; left foot is about the same there is less surrounding erythema. Surface still has tightly adherent debris which I have debriding however not making any progress Right foot has [name] substantial wound on the medial right second toe between the first and second webspace. Still an open area on the left lateral calf distal area. Buttock wound is about the same 2/22; left foot is about the same less surrounding erythema. Surface has adherent debris. Polymen Ag Right foot area significant wound between the first and second toes. We have been using silver alginate here Left lateral leg polymen Ag at the base of his original venous insufficiency wound Left buttock some improvement here 3/1; Right foot is deteriorating in the first second toe webspace. Larger and more substantial. We have been using silver alginate. Left dorsal foot about the same markedly adherent surface debris using PolyMem Ag Left lateral calf surface debris using PolyMem AG Left buttock is improved again using PolyMem Ag. He is completing his terbinafine. The erythema in the foot  seems better. He has been on this for 2 [name] 3/8; no improvement in any wound area in fact he has [name] small open area on the dorsal midfoot which is new this week. He has not gotten his foot x-rays yet 3/15; his x-rays were both negative for osteomyelitis of both feet. No major change in any of his wounds on the extremities however his buttock wounds are better. We have been using polymen on the buttocks, left lower leg. Iodoflex on the left foot and silver alginate on the right 3/22; arrives in clinic today with the 2 major issues are the improvement in the left dorsal foot wound which for once actually looks healthy with [name] nice healthy wound surface without debridement. Using Iodoflex here. Unfortunately on the left lateral calf which is in the distal part of his original wound he came to the clinic here for there was purulent drainage noted some increased breakdown scattered around the original area and [name] small area proximally. We we are using polymen here will change to silver alginate today. His buttock wound on the left is better and I think the area on the right first second toe webspace is  also improved 3/29; left dorsal foot looks better. Using Iodoflex. Left ankle culture from deterioration last time grew E. coli, Enterobacter and Enterococcus. I will give him [name] course of cefdinir although that will not cover Enterococcus. The area on the right foot in the webspace of the first and second toe lateral first toe looks better. The area on his buttock is about healed Vascular appointment is on April 21. This is to look at his venous system vis--vis continued breakdown of the wounds on the left including the left lateral leg and left dorsal foot he. He has had previous ablations on this side 4/5; the area between the right first and second toes lateral aspect of the first toe looks better. Dorsal aspect of the left first toe on the left foot also improved. Unfortunately the left lateral lower leg  is larger and there is [name] second satellite wound superiorly. The usual superficial abrasions on the left buttock overall better but certainly not closed 4/12; the area between the right first and second toes is improved. Dorsal aspect of the left foot also slightly smaller with [name] vibrant healthy looking surface. No real change in the left lateral leg and the left buttock wound is healed He has an unaffordable co-pay for Apligraf. Appointment with vein and vascular with regards to the left leg venous part of the circulation is on 4/21 4/19; we continue to see improvement in all wound areas. Although this is minor. He has his vascular appointment on 4/21. The area on the left buttock has not reopened although right in the center of this area the skin looks somewhat threatened 4/26; the left buttock is unfortunately reopened. In general his left dorsal foot has [name] healthy surface and looks somewhat smaller although it was not measured as such. The area between his first and second toe webspace on the right as [name] small wound against the first toe. The patient saw vascular surgery. The real question I was asking was about the small saphenous vein on the left. He has previously ablated left greater saphenous vein. Nothing further was commented on on the left. Right greater saphenous vein without reflux at the saphenofemoral junction or proximal thigh there was no indication for ablation of the right greater saphenous vein duplex was negative for DVT bilaterally. They did not think there was anything from [name] vascular surgery point of view that could be offered. They ABIs within normal limits 5/3; only small open area on the left buttock. The area on the left lateral leg which was his original venous reflux is now 2 wounds both which look clean. We are using Iodoflex on the left dorsal foot which looks healthy and smaller. He is down to [name] very tiny area between the right first and second toes, using  silver alginate 5/10; all of his wounds appear better. We have much better edema control in 4 layer compression on the left. This may be the factor that is allowing the left foot and left lateral calf to heal. He has external compression garments at home 04/14/20-All of his wounds are progressing well, the left forefoot is practically closed, left ischium appears to be about the same, right toe webspace is also smaller. The left lateral leg is about the same, continue using Hydrofera Blue to this, silver alginate to the ischium, Iodoflex to the toe space on the right 6/7; most of his wounds outside of the left buttock are doing well. The area on the left lateral calf and left  dorsal foot are smaller. The area on the right foot in between the first and second toe webspace is barely visible although he still says there is some drainage here is the only reason I did not heal this out. Unfortunately the area on the left buttock almost looks like he has [name] skin tear from tape. He has open wound and then [name] large flap of skin that we are trying to get adherence over an area just next to the remaining wound 6/21; 2 week follow-up. I believe is been here for nurse visits. Miraculously the area between his first and second toes on the left dorsal foot is closed over. Still open on the right first second web space. The left lateral calf has 2 open areas. Distally this is more superficial. The proximal area had [name] little more depth and required debridement of adherent necrotic material. His buttock wound is actually larger we have been using silver alginate here 6/28; the patient's area on the left foot remains closed. Still open wet area between the first and second toes on the right and also extending into the plantar aspect. We have been using silver alginate in this location. He has 2 areas on the left lower leg part of his original long wounds which I think are better. We have been using Hydrofera Blue here.  Hydrofera Blue to the left buttock which is stable 7/12; left foot remains closed. Left ankle is closed. May be [name] small area between his right first and second toes the only truly open area is on the left buttock. We have been using Hydrofera Blue here 7/19; patient arrives with marked deterioration especially in the left foot and ankle. We did not put him in [name] compression wrap on the left last week in fact he wore his juxta lite stockings on either side although he does not have an underlying stocking. He has [name] reopening on the left dorsal foot, left lateral ankle and [name] new area on the right dorsal ankle. More worrisome is the degree of erythema on the left foot extending on the lateral foot into the lateral lower leg on the left 7/26; the patient had erythema and drainage from the lateral left ankle last week. Culture of this grew MRSA resistant to doxycycline and clindamycin which are the 2 antibiotics we usually use with this patient who has multiple antibiotic allergies including linezolid, trimethoprim sulfamethoxazole. I had give him an empiric doxycycline and he comes in the area certainly looks somewhat better although it is blotchy in his lower leg. He has not been systemically unwell. He has had areas on the left dorsal foot which is [name] reopening, chronic wounds on the left lateral ankle. Both of these I think are secondary to chronic venous insufficiency. The area between his first and second toes is closed as far as I can tell. He had [name] new wrap injury on the right dorsal ankle last week. Finally he has an area on the left buttock. We have been using silver alginate to everything except the left buttock we are using Hydrofera Blue 06/30/20-Patient returns at 1 week, has been given [name] sample dose pack of NUZYRA which is [name] tetracycline derivative [omadacycline], patient has completed those, we have been using silver alginate to almost all the wounds except the left ischium where we are using  Hydrofera Blue all of them look better 8/16; since I last saw the patient he has been doing well. The area on the left buttock, left lateral  ankle and left foot are all closed today. He has completed the [place] I gave him last time and tolerated this well. He still has open areas on the right dorsal ankle and in the right first second toe area which we are using silver alginate. 8/23; we put him in his bilateral external compression stockings last week as he did not have anything open on either leg except for concerning area between the right first and second toe. He comes in today with an area on the left dorsal foot slightly more proximal than the original wound, the left lateral foot but this is actually [name] continuation of the area he had on the left lateral ankle from last time. As well he is opened up on the left buttock again. Electronic Signature(s) Signed: 07/14/2020 4:33:08 PM By: Linton Ham MD Entered By: Linton Ham on 07/14/2020 08:32:12 -------------------------------------------------------------------------------- Physical Exam Details Patient Name: Date of Service: Kaine, [name] 07/14/2020 7:30 [name] Medical Record Number: [id number] Patient Account Number: [id number] Date of Birth/Sex: Treating RN: 1986-08-01 (34 y.o. [name] Primary Care Provider: Osterdock, [place] Other Clinician: Referring Provider: Treating Provider/Extender: [name] in Treatment: 236 Constitutional Sitting or standing Blood Pressure is within target range for patient.. Pulse regular and within target range for patient.Marland Kitchen Respirations regular, non-labored and within target range.. Temperature is normal and within the target range for the patient.Marland Kitchen Appears in no distress. Cardiovascular Pedal pulses are palpable. No major problems with edema. Notes Wound exam; the left buttock is reopened and the small area. There is dry, excoriated, irritated around the area -He  has [name] new open area on the left lateral foot however this is an continuation of the area that was present on his ankle that we only healed out 2 [name] ago. However it is not in the same spot Similarly on the dorsal left foot he has [name] new open area but proximal from the original wound There is nothing really open on the right foot Area on the right ankle appears to be getting smaller Electronic Signature(s) Signed: 07/14/2020 4:33:08 PM By: Linton Ham MD Entered By: Linton Ham on 07/14/2020 08:35:16 -------------------------------------------------------------------------------- Physician Orders Details Patient Name: Date of Service: [name], [name] 07/14/2020 7:30 [name] Medical Record Number: [id number] Patient Account Number: [id number] Date of Birth/Sex: Treating RN: 04-12-1986 (34 y.o. [name] Primary Care Provider: [name], [name] Other Clinician: Referring Provider: Treating Provider/Extender: [name] in Treatment: 236 Verbal / Phone Orders: No Diagnosis Coding ICD-10 Coding Code Description I87.332 Chronic venous hypertension (idiopathic) with ulcer and inflammation of left lower extremity L97.321 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 to breakdown of skin L97.311 Non-pressure chronic ulcer of right ankle limited to breakdown of skin L97.511 Non-pressure chronic ulcer of other part of right foot limited to breakdown of skin L97.521 Non-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 L03.116 Cellulitis of left lower limb 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 Dressing Change Frequency Wound #38 Right T - Web between 1st and 2nd oe Change Dressing every other day. Wound #41R Left Ischium Change Dressing every other day. Wound #42 Right,Dorsal Ankle Change Dressing every other day. Wound #44 Left,Medial  Foot Do not change entire dressing for one week. Wound #45 Left,Lateral Foot Do not change entire dressing for one week. Skin Barriers/Peri-Wound Care Moisturizing lotion Wound Cleansing May shower  and wash wound with soap and water. - on days that dressing is changed Primary Wound Dressing Wound #38 Right T - Web between 1st and 2nd oe Calcium Alginate with Silver Wound #41R Left Ischium Calcium Alginate with Silver Wound #42 Right,Dorsal Ankle Calcium Alginate with Silver Wound #44 Left,Medial Foot Calcium Alginate with Silver Wound #45 Left,Lateral Foot Calcium Alginate with Silver Secondary Dressing Wound #38 Right T - Web between 1st and 2nd oe Kerlix/Rolled Gauze Dry Gauze Wound #42 Right,Dorsal Ankle Dry Gauze Wound #44 Left,Medial Foot Dry Gauze Wound #45 Left,Lateral Foot Dry Gauze Wound #41R Left Ischium Foam Border - or ABD pad and tape Edema Control 4 layer compression: Left lower extremity Elevate legs to the level of the heart or above for 30 minutes daily and/or when sitting, [name] frequency of: - throughout the day Support Garment 30-40 mm/Hg pressure to: - Juxtalite to right leg daily Off-Loading Low air-loss mattress (Group 2) Roho cushion for wheelchair Turn and reposition every 2 hours - out of wheelchair throughout the day, try to lay on sides, sleep in the bed not the recliner Electronic Signature(s) Signed: 07/14/2020 4:21:43 PM By: Levan Hurst RN, BSN Signed: 07/14/2020 4:33:08 PM By: Linton Ham MD Entered By: Levan Hurst on 07/14/2020 08:24:14 -------------------------------------------------------------------------------- Problem List Details Patient Name: Date of Service: Fountain, [name] 07/14/2020 7:30 [name] Medical Record Number: [id number] Patient Account Number: [id number] Date of Birth/Sex: Treating RN: 10-08-1986 (34 y.o. [name] Primary Care Provider: Social Circle, Julian Other Clinician: Referring Provider: Treating  Provider/Extender: [name] in Treatment: 236 Active Problems ICD-10 Encounter Code Description Active Date MDM Diagnosis I87.332 Chronic venous hypertension (idiopathic) with ulcer and inflammation of left 02/25/2020 No Yes lower extremity L97.321 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 to breakdown of skin 11/26/2019 No Yes L97.311 Non-pressure chronic ulcer of right ankle limited to breakdown of skin 06/09/2020 No Yes L97.511 Non-pressure chronic ulcer of other part of right foot limited to breakdown of 08/05/2016 No Yes skin L97.521 Non-pressure chronic ulcer of other part of left foot limited to breakdown of 07/25/2018 No Yes skin L03.116 Cellulitis of left lower limb 12/17/2019 No Yes 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, stage 3 09/17/2019 No Yes 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, complete 01/02/2016 No Yes Inactive Problems ICD-10 Code Description Active Date Inactive Date 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, stage 3 01/02/2016 01/02/2016 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, stage 3 12/05/2017 12/05/2017 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115 Cellulitis of right lower limb 04/20/2018 04/20/2018 L03.116 Cellulitis of left lower limb 05/16/2018 05/16/2018 L03.115 Cellulitis of right lower limb 04/02/2019 04/02/2019 Electronic Signature(s) Signed: 07/14/2020  4:33:08 PM By: Linton Ham MD Entered By: Linton Ham on 07/14/2020 08:30:44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 better. 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 Problematic areas on the plantar aspect of the first second toe web space on the left and the dorsal aspect of the first second toe web space on the right. At one point I felt these were probably related to chronic fungal infections in treated him aggressively for this although we have not made any improvement here. 08/11/17; left lateral leg. Surface continues to improve with the Iodoflex although we are not seeing much improvement in overall wound dimensions. Areas on his plantar left foot and right foot show no improvement. In fact the right foot looks somewhat worse 08/18/17; left lateral leg. We changed to [facility] Blue last week after [name] prolonged course of Iodoflex which helps get the surface better. It appears that the wound with is improved. Continue with difficult areas on the left dorsal first second and plantar first second on the right 09/01/17; patient arrives in clinic today having had [name] temperature of 103 yesterday. He was seen in the ER and [facility]. The patient was concerned he could have cellulitis again in the right leg however they diagnosed him with [name] UTI and he is now on Keflex. He has [name] history of cellulitis which  is been recurrent and difficult but this is been in the left leg, in the past 5 use doxycycline. He does in and out catheterizations at home which are risk factors for  UTI 09/08/17; patient will be completing his Keflex this weekend. The erythema on the left leg is considerably better. He has [name] new wound today on the medial part of the right leg small superficial almost looks like [name] skin tear. He has worsening of the area on the right dorsal first and second toe. His major area on the left lateral leg is better. Using Hydrofera Blue on all areas 09/15/17; gradual reduction in width on the long wound in the left lateral leg. No debridement required. He also has wounds on the plantar aspect of his left first second toe web space and on the dorsal aspect of the right first second toe web space. 09/22/17; there continues to be very gradual improvements in the dimensions of the left lateral leg wound. He hasn't round erythematous spot with might be pressure on his wheelchair. There is no evidence obviously of infection no purulence no warmth ooHe has [name] dry scaled area on the plantar aspect of the left first second toe ooImproved area on the dorsal right first second toe. 09/29/17; left lateral leg wound continues to improve in dimensions mostly with an is still [name] fairly long but increasingly narrow wound. ooHe has [name] dry scaled area on the plantar aspect of his left first second toe web space ooIncreasingly concerning area on the dorsal right first second toe. In fact I am concerned today about possible cellulitis around this wound. The areas extending up his second toe and although there is deformities here almost appears to abut on the nailbed. 10/06/17; left lateral leg wound continues to make very gradual progress. Tissue culture I did from the right first second toe dorsal foot last time grew MRSA and enterococcus which was vancomycin sensitive. This was not sensitive to clindamycin or doxycycline. He is allergic to Zyvox and sulfa we have therefore arrange for him to have dalvance infusion tomorrow. He is had this in the past and tolerated it well 10/20/17; left  lateral leg wound continues to make decent progress. This is certainly reduced in terms of with there is advancing epithelialization.ooThe cellulitis in the right foot looks better although he still has [name] deep wound in the dorsal aspect of the first second toe web space. Plantar left first toe web space on the left I think is making some progress 10/27/17; left lateral leg wound continues to make decent progress. Advancing epithelialization.using Hydrofera Blue ooThe right first second toe web space wound is better-looking using silver alginate ooImprovement in the left plantar first second toe web space. Again using silver alginate 11/03/17 left lateral leg wound continues to make decent progress albeit slowly. Using Hydrofera Blue ooThe right per second toe web space continues to be [name] very problematic looking punched out wound. I obtained [name] piece of tissue for deep culture I did extensively treated this for fungus. It is difficult to imagine that this is [name] pressure area as the patient states other than going outside he doesn't really wear shoes at home ooThe left plantar first second toe web space looked fairly senescent. Necrotic edges. This required debridement oochange to Hydrofera Blue to all wound areas 11/10/17; left lateral leg wound continues to contract. Using Hydrofera Blue ooOn the right dorsal first second toe web space dorsally. Culture I did of this area last week grew MRSA  there is not an easy oral option in this patient was multiple antibiotic allergies or intolerances. This was only [name] rare culture isolate I'm therefore going to use Bactroban under silver alginate ooOn the left plantar first second toe web space. Debridement is required here. This is also unchanged 11/17/17; left lateral leg wound continues to contract using Hydrofera Blue this is no longer the major issue. ooThe major concern here is the right first second toe web space. He now has an open area going from  dorsally to the plantar aspect. There is now wound on the inner lateral part of the first toe. Not [name] very viable surface on this. There is erythema spreading medially into the forefoot. ooNo major change in the left first second toe plantar wound 11/24/17; left lateral leg wound continues to contract using Hydrofera Blue. Nice improvement today ooThe right first second toe web space all of this looks [name] lot less angry than last week. I have given him clindamycin and topical Bactroban for MRSA and terbinafine for the possibility of underlining tinea pedis that I could not control with ketoconazole. Looks somewhat better ooThe area on the plantar left first second toe web space is weeping with dried debris around the wound 12/01/17; left lateral leg wound continues to contract he Hydrofera Blue. It is becoming thinner in terms of with nevertheless it is making good improvement. ooThe right first second toe web space looks less angry but still [name] large necrotic-looking wounds starting on the plantar aspect of the right foot extending between the toes and now extensively on the base of the right second toe. I gave him clindamycin and topical Bactroban for MRSA anterior benefiting for the possibility of underlying tinea pedis. Not looking better today ooThe area on the left first/second toe looks better. Debrided of necrotic debris 12/05/17* the patient was worked in urgently today because over the weekend he found blood on his incontinence bad when he woke up. He was found to have an ulcer by his wife who does most of his wound care. He came in today for [place] to look at this. He has not had [name] history of wounds in his buttocks in spite of his paraplegia. 12/08/17; seen in follow-up today at his usual appointment. He was seen earlier this week and found to have [name] new wound on his buttock. We also follow him for wounds on the left lateral leg, left first second toe web space and right first second toe web  space 12/15/17; we have been using Hydrofera Blue to the left lateral leg which has improved. The right first second toe web space has also improved. Left first second toe web space plantar aspect looks stable. The left buttock has worsened using Santyl. Apparently the buttock has drainage 12/22/17; we have been using Hydrofera Blue to the left lateral leg which continues to improve now 2 small wounds separated by normal skin. He tells [place] he had [name] fever up to 100 yesterday he is prone to UTIs but has not noted anything different. He does in and out catheterizations. The area between the first and second toes today does not look good necrotic surface covered with what looks to be purulent drainage and erythema extending into the third toe. I had gotten this to something that I thought look better last time however it is not look good today. He also has [name] necrotic surface over the buttock wound which is expanded. I thought there might be infection under here so I  removed [name] lot of the surface with [name] #5 curet though nothing look like it really needed culturing. He is been using Santyl to this area 12/27/17; his original wound on the left lateral leg continues to improve using Hydrofera Blue. I gave him samples of Baxdella although he was unable to take them out of fear for an allergic reaction ["lump in his throat"].the culture I did of the purulent drainage from his second toe last week showed both enterococcus and [name] set Enterobacter I was also concerned about the erythema on the bottom of his foot although paradoxically although this looks somewhat better today. 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 We've been using silver alginate here. They came up today that he sleeps in [name] recliner, I'm not sure why but I asked him to stop this 01/03/18; his original wound we've been using Hydrofera Blue is now separated into  2 areas. ooUlcer on his left buttock is better he is off the recliner and sleeping in bed ooFinally both wound areas between his first and second toes also looks some better 01/10/18; his original wound on the left lateral leg is now separated into 2 wounds we've been using Hydrofera Blue ooUlcer on his left buttock has some drainage. There is [name] small probing site going into muscle layer superiorly.using silver alginate -He arrives today with [name] deep tissue injury on the left heel ooThe wound on the dorsal aspect of his first second toe on the left looks [name] lot betterusing silver alginate ketoconazole ooThe area on the first second toe web space on the right also looks [name] lot bette 01/17/18; his original wound on the left lateral leg continues to progress using Hydrofera Blue ooUlcer on his left buttock also is smaller surface healthier except for [name] small probing site going into the muscle layer superiorly. 2.4 cm of tunneling in this area ooDTI on his left heel we have only been offloading. Looks better than last week no threatened open no evidence of infection oothe wound on the dorsal aspect of the first second toe on the left continues to look like it's regressing we have only been using silver alginate and terbinafine orally ooThe area in the first second toe web space on the right also looks to be [name] lot better using silver alginate and terbinafine I think this was prompted by tinea pedis 01/31/18; the patient was hospitalized in [place] last week apparently for [name] complicated UTI. He was discharged on cefepime he does in and out catheterizations. In the hospital he was discovered M I don't mild elevation of AST and ALT and the terbinafine was stopped.predictably the pressure ulcer on s his buttock looks betterusing silver alginate. The area on the left lateral leg also is better using Hydrofera Blue. The area between the first and second toes on the left better. First and second toes on  the right still substantial but better. Finally the DTI on the left heel has held together and looks like it's resolving 02/07/18-he is here in follow-up evaluation for multiple ulcerations. He has new injury to the lateral aspect of the last issue [name] pressure ulcer, he states this is from adhesive removal trauma. He states he has tried multiple adhesive products with no success. All other ulcers appear stable. The left heel DTI is resolving. We will continue with same treatment plan and follow-up next week. 02/14/18; follow-up for multiple areas. ooHe has  [name] new area last week on the lateral aspect of his pressure ulcer more over the posterior trochanter. The original pressure ulcer looks quite stable has healthy granulation. 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 Almost fully epithelialized on the original superior area using Hydrofera Blue ooDTI on the left heel has peeled off this week to reveal [name] small superficial wound under denuded skin and subcutaneous tissue ooBoth areas between the first and second toes look better including nothing open on the left 02/21/18; ooThe patient's wounds on his left ischial tuberosity and posterior left greater trochanter actually looked better. He has [name] large area of irritation around the area which I think is contact dermatitis. I am doubtful that this is fungal ooHis original wound on the left lateral calf continues to improve we have been using Hydrofera Blue ooThere is no open area in the left first second toe web space although there is [name] lot of thick callus ooThe DTI on the left heel required debridement today of necrotic surface eschar and subcutaneous tissue using silver alginate ooFinally the area on the right first second toe webspace continues to contract using silver alginate and ketoconazole 02/28/18 ooLeft ischial tuberosity wounds  look better using silver alginate. ooOriginal wound on the left calf only has one small open area left using Hydrofera Blue ooDTI on the left heel required debridement mostly removing skin from around this wound surface. Using silver alginate ooThe areas on the right first/second toe web space using silver alginate and ketoconazole 03/08/18 on evaluation today patient appears to be doing decently well as best I can tell in regard to his wounds. This is the first time that I have seen him as he generally is followed by Dr. Dellia Nims. With that being said none of his wounds appear to be infected he does have an area where there is some skin covering what appears to be [name] new wound on the left dorsal surface of his great toe. This is right at the nail bed. With that being said I do believe that debrided away some of the excess skin can be of benefit in this regard. Otherwise he has been tolerating the dressing changes without complication. 03/14/18; patient arrives today with the multiplicity of wounds that we are following. He has not been systemically unwell ooOriginal wound on the left lateral calf now only has 2 small open areas we've been using Hydrofera Blue which should continue ooThe deep tissue injury on the left heel requires debridement today. We've been using silver alginate ooThe left first second toe and the right first second toe are both are reminiscence what I think was tinea pedis. Apparently some of the callus Surface between the toes was removed last week when it started draining. ooPurulent drainage coming from the wound on the ischial tuberosity on the left. 03/21/18-He is here in follow-up evaluation for multiple wounds. 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 He tolerated debridement. The only change to last week's recommendations is to discontinue antifungal cream between toes. He will follow-up next  week 03/28/18; following up for multiple wounds;Concern this week is streaking redness and swelling in the right foot. He is going to need antibiotics for this. 03/31/18; follow-up for right foot cellulitis. Streaking redness and swelling in the right foot on 03/28/18. 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 I put  him on clindamycin 300 mg every 6 and brought him in for [name] quick check. He has an open wound between his first and second toes on the right foot as [name] potential source. 04/04/18; ooRight foot cellulitis is resolving he is completing clindamycin. This is truly good news ooLeft lateral calf wound which is initial wound only has one small open area inferiorly this is close to healing out. He has compression stockings. We will use Hydrofera Blue right down to the epithelialization of this ooNonviable surface on the left heel which was initially pressure with [name] DTI. We've been using Hydrofera Blue. I'm going to switch this back to silver alginate ooLeft first second toe/tinea pedis this looks better using silver alginate ooRight first second toe tinea pedis using silver alginate ooLarge pressure ulcers on theLeft ischial tuberosity. Small wound here Looks better. I am uncertain about the surface over the large wound. Using silver alginate 04/11/18; ooCellulitis in the right foot is resolved ooLeft lateral calf wound which was his original wounds still has 2 tiny open areas remaining this is just about closed ooNonviable surface on the left heel is better but still requires debridement ooLeft first second toe/tinea pedis still open using silver alginate ooRight first second toe wound tinea pedis I asked him to go back to using ketoconazole and silver alginate ooLarge pressure ulcers on the left ischial tuberosity this shear injury here is resolved. Wound is smaller. No evidence of infection using silver alginate 04/18/18; ooPatient arrives with an intense area of  cellulitis in the right mid lower calf extending into the right heel area. Bright red and warm. Smaller area on the left anterior leg. He has [name] significant history of MRSA. He will definitely need antibioticsoodoxycycline ooHe now has 2 open areas on the left ischial tuberosity the original large wound and now [name] satellite area which I think was above his initial satellite areas. Not [name] wonderful surface on this satellite area surrounding erythema which looks like pressure related. ooHis left lateral calf wound again his original wound is just about closed ooLeft heel pressure injury still requiring debridement ooLeft first second toe looks [name] lot better using silver alginate ooRight first second toe also using silver alginate and ketoconazole cream also looks better 04/20/18; the patient was worked in early today out of concerns with his cellulitis on the right leg. I had started him on doxycycline. This was 2 days ago. His wife was concerned about the swelling in the area. Also concerned about the left buttock. He has not been systemically unwell no fever chills. No nausea vomiting or diarrhea 04/25/18; the patient's left buttock wound is continued to deteriorate he is using Hydrofera Blue. He is still completing clindamycin for the cellulitis on the right leg although all of this looks better. 05/02/18 ooLeft buttock wound still with [name] lot of drainage and [name] very tightly adherent fibrinous necrotic surface. He has [name] deeper area superiorly ooThe left lateral calf wound is still closed ooDTI wound on the left heel necrotic surface especially the circumference using Iodoflex ooAreas between his left first second toe and right first second toe both look better. Dorsally and the right first second toe he had [name] necrotic surface although at smaller. In using silver alginate and ketoconazole. I did [name] culture last week which was [name] deep tissue culture of the reminiscence of the open wound on the right  first second toe dorsally. This grew [name] few Acinetobacter and [name] few methicillin-resistant staph aureus. Nevertheless the area  actually this week looked better. I didn't feel the need to specifically address this at least in terms of systemic antibiotics. 05/09/18; wounds are measuring larger more drainage per our intake. We are using Santyl covered with alginate on the large superficial buttock wounds, Iodosorb on the left heel, ketoconazole and silver alginate to the dorsal first and second toes bilaterally. 05/16/18; ooThe area on his left buttock better in some aspects although the area superiorly over the ischial tuberosity required an extensive debridement.using Santyl ooLeft heel appears stable. Using Iodoflex ooThe areas between his first and second toes are not bad however there is spreading erythema up the dorsal aspect of his left foot this looks like cellulitis again. He is insensate the erythema is really very brilliant.o Erysipelas He went to see an allergist days ago because he was itching part of this he had lab work done. This showed [name] white count of 15.1 with 70% neutrophils. Hemoglobin of 11.4 and [name] platelet count of 659,000. Last white count we had in Epic was [name] 2-1/2 years ago which was 25.9 but he was ill at the time. He was able to show me some lab work that was done by his primary physician the pattern is about the same. 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 But prompted me to go ahead and do x-rays of both feet and the pelvis rule out osteomyelitis. He also had [name] comprehensive metabolic panel this was reasonably normal his albumin was 3.7 liver function tests BUN/creatinine all normal 05/23/18; x-rays of both his feet from last week were negative for underlying pulmonary abnormality. The x-ray of his pelvis however showed mild irregularity in the left ischial which may represent some early osteomyelitis. The wound in the left  ischial continues to get deeper clearly now exposed muscle. Each week necrotic surface material over this area. Whereas the rest of the wounds do not look so bad. ooThe left ischial wound we have been using Santyl and calcium alginate ooT the left heel surface necrotic debris using Iodoflex o ooThe left lateral leg is still healed ooAreas on the left dorsal foot and the right dorsal foot are about the same. There is some inflammation on the left which might represent contact dermatitis, fungal dermatitis I am doubtful cellulitis although this looks better than last week 05/30/18; CT scan done at Hospital did not show any osteomyelitis or abscess. Suggested the possibility of underlying cellulitis although I don't see [name] lot of evidence of this at the bedside ooThe wound itself on the left buttock/upper thigh actually looks somewhat better. No debridement ooLeft heel also looks better no debridement continue Iodoflex ooBoth dorsal first second toe spaces appear better using Lotrisone. Left still required debridement 06/06/18; ooIntake reported some purulent looking drainage from the left gluteal wound. Using Santyl and calcium alginate ooLeft heel looks better although still [name] nonviable surface requiring debridement ooThe left dorsal foot first/second webspace actually expanding and somewhat deeper. I may consider doing [name] shave biopsy of this area ooRight dorsal foot first/second webspace appears stable to improved. Using Lotrisone and silver alginate to both these areas 06/13/18 ooLeft gluteal surface looks better. Now separated in the 2 wounds. No debridement required. Still drainage. We'll continue silver alginate ooLeft heel continues to look better with Iodoflex continue this for at least another week ooOf his dorsal foot wounds the area on the left still has some depth although it looks better than last week. We've been  using Lotrisone and silver alginate 06/20/18 ooLeft gluteal  continues to look better healthy tissue ooLeft heel continues to look better healthy granulation wound is smaller. He is using Iodoflex and his long as this continues continue the Iodoflex ooDorsal right foot looks better unfortunately dorsal left foot does not. There is swelling and erythema of his forefoot. He had minor trauma to this several days ago but doesn't think this was enough to have caused any tissue injury. Foot looks like cellulitis, we have had this problem before 06/27/18 on evaluation today patient appears to be doing [name] little worse in regard to his foot ulcer. 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 Both of which would really be his only options for treating this infection. In the past he has been given and effusion of Orbactiv. This is done very well for him in the past again it's one time dosing IV antibiotic therapy. Subsequently I do believe this is something we're gonna need to see about doing at this point in time. Currently his other wounds seem to be doing somewhat better in my pinion I'm pretty happy in that regard. 07/03/18 on evaluation today patient's wounds actually appear to be doing fairly well. He has been tolerating the dressing changes without complication. All in all he seems to be showing signs of improvement. In regard to the antibiotics he has been dealing with infectious disease since I saw him last week as far as getting this scheduled. In the end [facility]'s [facility] this coming Friday. In the meantime he has been continuing to perform the dressing changes in such as previous. There does not appear to be any evidence of infection worsengin at this time. 07/10/18; ooSince I last saw this man 2 [name] ago things have actually improved. IV antibiotics of resulted in  less forefoot erythema although there is still some present. He is not systemically unwell ooLeft buttock wounds o2 now have no depth there is increased epithelialization Using silver alginate ooLeft heel still requires debridement using Iodoflex ooLeft dorsal foot still with [name] sizable wound about the size of [name] border but healthy granulation ooRight dorsal foot still with [name] slitlike area using silver alginate 07/18/18; the patient's cellulitis in the left foot is improved in fact I think it is on its way to resolving. ooLeft buttock wounds o2 both look better although the larger one has hypertension granulation we've been using silver alginate ooLeft heel has some thick circumferential redundant skin over the wound edge which will need to be removed today we've been using Iodoflex ooLeft dorsal foot is still [name] sizable wound required debridement using silver alginate ooThe right dorsal foot is just about closed only [name] small open area remains here 07/25/18; left foot cellulitis is resolved ooLeft buttock wounds o2 both look better. Hyper-granulation on the major area ooLeft heel as some debris over the surface but otherwise looks [name] healthier wound. Using silver collagen ooRight dorsal foot is just about closed 07/31/18; arrives with our intake nurse worried about purulent drainage from the buttock. We had hyper-granulation here last week ooHis buttock wounds o2 continue to look better ooLeft heel some debris over the surface but measuring smaller. ooRight dorsal foot unfortunately has openings between the toes ooLeft foot superficial wound looks less aggravated. 08/07/18 ooButtock wounds continue  to look better although some of her granulation and the larger medial wound. silver alginate ooLeft heel continues to look [name] lot better.silver collagen ooLeft foot superficial wound looks less stable. Requires debridement. He has [name] new wound superficial area on the foot on the lateral  dorsal foot. ooRight foot looks better using silver alginate without Lotrisone 08/14/2018; patient was in the ER last week diagnosed with [name] UTI. He is now on Cefpodoxime and Macrodantin. ooButtock wounds continued to be smaller. Using silver alginate ooLeft heel continues to look better using silver collagen ooLeft foot superficial wound looks as though it is improving ooRight dorsal foot area is just about healed. 08/21/2018; patient is completed his antibiotics for his UTI. ooHe has 2 open areas on the buttocks. There is still not closed although the surface looks satisfactory. Using silver alginate ooLeft heel continues to improve using silver collagen ooThe bilateral dorsal foot areas which are at the base of his first and second toes/possible tinea pedis are actually stable on the left but worse on the right. The area on the left required debridement of necrotic surface. After debridement I obtained [name] specimen for PCR culture. ooThe right dorsal foot which is been just about healed last week is now reopened 08/28/2018; culture done on the left dorsal foot showed coag negative staph both staph epidermidis and Lugdunensis. I think this is worthwhile initiating systemic treatment. I will use doxycycline given his long list of allergies. The area on the left heel slightly improved but still requiring debridement. ooThe large wound on the buttock is just about closed whereas the smaller one is larger. Using silver alginate in this area 09/04/2018; patient is completing his doxycycline for the left foot although this continues to be [name] very difficult wound area with very adherent necrotic debris. We are using silver alginate to all his wounds right foot left foot and the small wounds on his buttock, silver collagen on the left heel. 09/11/2018; once again this patient has intense erythema and swelling of the left forefoot. Lesser degrees of erythema in the right foot. He has [name] long list  of allergies and intolerances. I will reinstitute doxycycline. 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 Using silver alginate ooLeft heel also looks better using silver collagen ooUnfortunately both the areas on his feet look worse. The area on the left first second webspace is now gone through to the plantar part of his foot. The area on the left foot anteriorly is irritated with erythema and swelling in the forefoot. 09/25/2018 ooHis wound on the left plantar heel looks better. Using silver collagen ooThe area on the left buttock 2 small remnant areas. One is closed one is still open. Using silver alginate ooThe areas between both his first and second toes look worse. This in spite of long-standing antifungal therapy with ketoconazole and silver alginate which should have antifungal activity ooHe has small areas around his original wound on the left calf one is on the bottom of the original scar tissue and one superiorly both of these are small and superficial but again given wound history in this site this is worrisome 10/02/2018 ooLeft plantar heel continues to gradually contract using silver collagen ooLeft buttock wound is unchanged using silver alginate ooThe areas on his dorsal feet between his first and second toes bilaterally look about the same. I prescribed clindamycin ointment to see if we can address chronic staph colonization and also the underlying  possibility of erythrasma ooThe left lateral lower extremity wound is actually on the lateral part of his ankle. Small open area here. We have been using silver alginate 10/09/2018; ooLeft plantar heel continues to look healthy and contract. No debridement is required ooLeft buttock slightly smaller with [name] tape injury wound just below which was new this week ooDorsal feet somewhat improved I have been using clindamycin ooLeft lateral looks lower extremity the actual  open area looks worse although [name] lot of this is epithelialized. I am going to change to silver collagen today He has [name] lot more swelling in the right leg although this is not pitting not red and not particularly warm there is [name] lot of spasm in the right leg usually indicative of people with paralysis of some underlying discomfort. We have reviewed his vascular status from 2017 he had [name] left greater saphenous vein ablation. I wonder about referring him back to vascular surgery if the area on the left leg continues to deteriorate. 10/16/2018 in today for follow-up and management of multiple lower extremity ulcers. His left Buttock wound is much lower smaller and almost closed completely. The wound to the left ankle has began to reopen with Epithelialization and some adherent slough. He has multiple new areas to the left foot and leg. The left dorsal foot without much improvement. Wound present between left great webspace and 2nd toe. Erythema and edema present right leg. Right LE ultrasound obtained on 10/10/18 was negative for DVT . 10/23/2018; ooLeft buttock is closed over. Still dry macerated skin but there is no open wound. I suspect this is chronic pressure/moisture ooLeft lateral calf is quite [name] bit worse than when I saw this last. There is clearly drainage here he has macerated skin into the left plantar heel. We will change the primary dressing to alginate ooLeft dorsal foot has some improvement in overall wound area. Still using clindamycin and silver alginate ooRight dorsal foot about the same as the left using clindamycin and silver alginate ooThe erythema in the right leg has resolved. He is DVT rule out was negative ooLeft heel pressure area required debridement although the wound is smaller and the surface is health 10/26/2018 ooThe patient came back in for his nurse check today predominantly because of the drainage coming out of the left lateral leg with [name] recent reopening of  his original wound on the left lateral calf. He comes in today with [name] large amount of surrounding erythema around the wound extending from the calf into the ankle and even in the area on the dorsal foot. He is not systemically unwell. He is not febrile. Nevertheless this looks like cellulitis. We have been using silver alginate to the area. I changed him to [name] regular visit and I am going to prescribe him doxycycline. The rationale here is [name] long list of medication intolerances and [name] history of MRSA. I did not see anything that I thought would provide [name] valuable culture 10/30/2018 ooFollow-up from his appointment 4 days ago with really an extensive area of cellulitis in the left calf left lateral ankle and left dorsal foot. I put him on doxycycline. He has [name] long list of medication allergies which are true allergy reactions. 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 In any case he is [name] lot better today. The erythema especially in the anterior and lateral left calf is better. He still has left ankle erythema. He also is complaining  about increasing edema in the right leg we have only been using Kerlix Coban and he has been doing the wraps at home. Finally he has [name] spotty rash on the medial part of his upper left calf which looks like folliculitis or perhaps wrap occlusion type injury. Small superficial macules not pustules 11/06/18 patient arrives today with again [name] considerable degree of erythema around the wound on the left lateral calf extending into the dorsal ankle and dorsal foot. This is [name] lot worse than when I saw this last week. He is on doxycycline really with not [name] lot of improvement. He has not been systemically unwell Wounds on the; left heel actually looks improved. Original area on the left foot and proximity to the first and second toes looks about the same. He has superficial areas on the dorsal foot, anterior calf  and then the reopening of his original wound on the left lateral calf which looks about the same ooThe only area he has on the right is the dorsal webspace first and second which is smaller. ooHe has [name] large area of dry erythematous skin on the left buttock small open area here. 11/13/2018; the patient arrives in much better condition. The erythema around the wound on the left lateral calf is [name] lot better. Not sure whether this was the clindamycin or the TCA and ketoconazole or just in the improvement in edema control [stasis dermatitis]. In any case this is [name] lot better. The area on the left heel is very small and just about resolved using silver collagen we have been using silver alginate to the areas on his dorsal feet 11/20/2018; his wounds include the left lateral calf, left heel, dorsal aspects of both feet just proximal to the first second webspace. He is stable to slightly improved. I did not think any changes to his dressings were going to be necessary 11/27/2018 he has [name] reopening on the left buttock which is surrounded by what looks like tinea or perhaps some other form of dermatitis. The area on the left dorsal foot has some erythema around it I have marked this area but I am not sure whether this is cellulitis or not. Left heel is not closed. Left calf the reopening is really slightly longer and probably worse 1/13; in general things look better and smaller except for the left dorsal foot. Area on the left heel is just about closed, left buttock looks better only [name] small wound remains in the skin looks better [using Lotrisone] 1/20; the area on the left heel only has [name] few remaining open areas here. Left lateral calf about the same in terms of size, left dorsal foot slightly larger right lateral foot still not closed. The area on the left buttock has no open wound and the surrounding skin looks [name] lot better 1/27; the area on the left heel is closed. Left lateral calf better but still  requiring extensive debridements. The area on his left buttock is closed. He still has the open areas on the left dorsal foot which is slightly smaller in the right foot which is slightly expanded. We have been using Iodoflex on these areas as well 2/3; left heel is closed. Left lateral calf still requiring debridement using Iodoflex there is no open area on his left buttock however he has dry scaly skin over [name] large area of this. Not really responding well to the Lotrisone. Finally the areas on his dorsal feet at the level of the first second webspace are  slightly smaller on the right and about the same on the left. Both of these vigorously debrided with Anasept and gauze 2/10; left heel remains closed he has dry erythematous skin over the left buttock but there is no open wound here. Left lateral leg has come in and with. Still requiring debridement we have been using Iodoflex here. Finally the area on the left dorsal foot and right dorsal foot are really about the same extremely dry callused fissured areas. He does not yet have [name] dermatology appointment 2/17; left heel remains closed. He has [name] new open area on the left buttock. The area on the left lateral calf is bigger longer and still covered in necrotic debris. No major change in his foot areas bilaterally. I am awaiting for [name] dermatologist to look on this. We have been using ketoconazole I do not know that this is been doing any good at all. 2/24; left heel remains closed. The left buttock wound that was new reopening last week looks better. The left lateral calf appears better also although still requires debridement. The major area on his foot is the left first second also requiring debridement. We have been putting Prisma on all wounds. I do not believe that the ketoconazole has done too much good for his feet. He will use Lotrisone I am going to give him [name] 2-week course of terbinafine. We still do not have [name] dermatology appointment 3/2  left heel remains closed however there is skin over bone in this area I pointed this out to him today. The left buttock wound is epithelialized but still does not look completely stable. The area on the left leg required debridement were using silver collagen here. With regards to his feet we changed to Lotrisone last week and silver alginate. 3/9; left heel remains closed. Left buttock remains closed. The area on the right foot is essentially closed. The left foot remains unchanged. Slightly smaller on the left lateral calf. Using silver collagen to both of these areas 3/16-Left heel remains closed. Area on right foot is closed. Left lateral calf above the lateral malleolus open wound requiring debridement with easy bleeding. Left dorsal wound proximal to first toe also debrided. Left ischial area open new. Patient has been using Prisma with wrapping every 3 days. Dermatology appointment is apparently tomorrow.Patient has completed his terbinafine 2-week course with some apparent improvement according to him, there is still flaking and dry skin in his foot on the left 3/23; area on the right foot is reopened. The area on the left anterior foot is about the same still [name] very necrotic adherent surface. He still has the area on the left leg and reopening is on the left buttock. He apparently saw dermatology although I do not have [name] note. According to the patient who is usually fairly well informed they did not have any good ideas. Put him on oral terbinafine which she is been on before. 3/30; using silver collagen to all wounds. Apparently his dermatologist put him on doxycycline and rifampin presumably some culture grew staph. I do not have this result. He remains on terbinafine although I have used terbinafine on him before 4/6; patient has had [name] fairly substantial reopening on the right foot between the first and second toes. He is finished his terbinafine and I believe is on doxycycline and  rifampin still as prescribed by dermatology. We have been using silver collagen to all his wounds although the patient reports that he thinks silver alginate does better  on the wounds on his buttock. 4/13; the area on his left lateral calf about the same size but it did not require debridement. ooLeft dorsal foot just proximal to the webspace between the first and second toes is about the same. Still nonviable surface. I note some superficial bronze discoloration of the dorsal part of his foot ooRight dorsal foot just proximal to the first and second toes also looks about the same. I still think there may be the same discoloration I noted above on the left ooLeft buttock wound looks about the same 4/20; left lateral calf appears to be gradually contracting using silver collagen. 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 The left dorsal foot wound is debrided of tightly adherent necrotic debris and really cleans up quite nicely. The right area is worse with expansion. I did not debride this it is now over the base of the second toe ooThe area on his left buttock is smaller no debridement is required using silver collagen 5/4; left calf continues to make good progress. ooHe arrives with erythema around the wounds on his dorsal foot which even extends to the plantar aspect. Very concerning for coexistent infection. He is finished the erythromycin I gave him for possible erythrasma this does not seem to have helped. ooThe area on the left foot is about the same base of the dorsal toes ooIs area on the buttock looks improved on the left 5/11; left calf and left buttock continued to make good progress. Left foot is about the same to slightly improved. ooMajor problem is on the right foot. He has not had an x-ray. Deep tissue culture I did last week showed both Enterobacter and E. coli. I did not change the doxycycline I put him on  empirically although neither 1 of these were plated to doxycycline. He arrives today with the erythema looking worse on both the dorsal and plantar foot. Macerated skin on the bottom of the foot. he has not been systemically unwell 5/18-Patient returns at 1 week, left calf wound appears to be making some progress, left buttock wound appears slightly worse than last time, left foot wound looks slightly better, right foot redness is marginally better. X-ray of both feet show no air or evidence of osteomyelitis. Patient is finished his Omnicef and terbinafine. He continues to have macerated skin on the bottom of the left foot as well as right 5/26; left calf wound is better, left buttock wound appears to have multiple small superficial open areas with surrounding macerated skin. X-rays that I did last time showed no evidence of osteomyelitis in either foot. He is finished cefdinir and doxycycline. I do not think that he was on terbinafine. He continues to have [name] large superficial open area on the right foot anterior dorsal and slightly between the first and second toes. I did send him to dermatology 2 months ago or so wondering about whether they would do [name] fungal scraping. I do not believe they did but did do [name] culture. We have been using silver alginate to the toe areas, he has been using antifungals at home topically either ketoconazole or Lotrisone. We are using silver collagen on the left foot, silver alginate on the right, silver collagen on the left lateral leg and silver alginate on the left buttock 6/1; left buttock area is healed. We have the left dorsal foot, left lateral leg and right dorsal foot. We are using silver alginate to the areas on both feet  and silver collagen to the area on his left lateral calf 6/8; the left buttock apparently reopened late last week. He is not really sure how this happened. He is tolerating the terbinafine. Using silver alginate to all wounds 6/15; left buttock  wound is larger than last week but still superficial. ooCame in the clinic today with [name] report of purulence from the left lateral leg I did not identify any infection ooBoth areas on his dorsal feet appear to be better. He is tolerating the terbinafine. Using silver alginate to all wounds 6/22; left buttock is about the same this week, left calf quite [name] bit better. His left foot is about the same however he comes in with erythema and warmth in the right forefoot once again. Culture that I gave him in the beginning of May showed Enterobacter and E. coli. I gave him doxycycline and things seem to improve although neither 1 of these organisms was specifically plated. 6/29; left buttock is larger and dry this week. Left lateral calf looks to me to be improved. Left dorsal foot also somewhat improved right foot completely unchanged. The erythema on the right foot is still present. He is completing the Ceftin dinner that I gave him empirically [see discussion above.) 7/6 - All wounds look to be stable and perhaps improved, the left buttock wound is slightly smaller, per patient bleeds easily, completed ceftin, the right foot redness is less, he is on terbinafine 7/13; left buttock wound about the same perhaps slightly narrower. Area on the left lateral leg continues to narrow. Left dorsal foot slightly smaller right foot about the same. We are using silver alginate on the right foot and Hydrofera Blue to the areas on the left. Unna boot on the left 2 layer compression on the right 7/20; left buttock wound absolutely the same. Area on lateral leg continues to get better. Left dorsal foot require debridement as did the right no major change in the 7/27; left buttock wound the same size necrotic debris over the surface. The area on the lateral leg is closed once again. His left foot looks better right foot about the same although there is some involvement now of the posterior first second toe area. He is  still on terbinafine which I have given him for [name] month, not certain [name] centimeter major change 06/25/19-All wounds appear to be slightly improved according to report, left buttock wound looks clean, both foot wounds have minimal to no debris the right dorsal foot has minimal slough. We are using Hydrofera Blue to the left and silver alginate to the right foot and ischial wound. 8/10-Wounds all appear to be around the same, the right forefoot distal part has some redness which was not there before, however the wound looks clean and small. Ischial wound looks about the same with no changes 8/17; his wound on the left lateral calf which was his original chronic venous insufficiency wound remains closed. Since I last saw him the areas on the left dorsal foot right dorsal foot generally appear better but require debridement. The area on his left initial tuberosity appears somewhat larger to me perhaps hyper granulated and bleeds very easily. We have been using Hydrofera Blue to the left dorsal foot and silver alginate to everything else 8/24; left lateral calf remains closed. The areas on his dorsal feet on the webspace of the first and second toes bilaterally both look better. The area on the left buttock which is the pressure ulcer stage II slightly smaller.  I change the dressing to Hydrofera Blue to all areas 8/31; left lateral calf remains closed. The area on his dorsal feet bilaterally look better. Using Hydrofera Blue. Still requiring debridement on the left foot. No change in the left buttock pressure ulcers however 9/14; left lateral calf remains closed. Dorsal feet look quite [name] bit better than 2 [name] ago. Flaking dry skin also [name] lot better with the ammonium lactate I gave him 2 [name] ago. The area on the left buttock is improved. He states that his Roho cushion developed [name] leak and he is getting [name] new one, in the interim he is offloading this vigorously 9/21; left calf remains closed. Left heel  which was [name] possible DTI looks better this week. He had macerated tissue around the left dorsal foot right foot looks satisfactory and improved left buttock wound. I changed his dressings to his feet to silver alginate bilaterally. Continuing Hydrofera Blue on the left buttock. 9/28 left calf remains closed. Left heel did not develop anything [possible DTI] dry flaking skin on the left dorsal foot. Right foot looks satisfactory. Improved left buttock wound. We are using silver alginate on his feet Hydrofera Blue on the buttock. I have asked him to go back to the Lotrisone on his feet including the wounds and surrounding areas 10/5; left calf remains closed. The areas on the left and right feet about the same. [name] lot of this is epithelialized however debris over the remaining open areas. He is using Lotrisone and silver alginate. The area on the left buttock using Hydrofera Blue 10/26. Patient has been out for 3 [name] secondary to Covid concerns. He tested negative but I think his wife tested positive. He comes in today with the left foot substantially worse, right foot about the same. Even more concerning he states that the area on his left buttock closed over but then reopened and is considerably deeper in one aspect than it was before [stage III wound] 11/2; left foot really about the same as last week. Quarter sized wound on the dorsal foot just proximal to the first second toes. Surrounding erythema with areas of denuded epithelium. This is not really much different looking. Did not look like cellulitis this time however. ooRight foot area about the same.. We have been using silver alginate alginate on his toes ooLeft buttock still substantial irritated skin around the wound which I think looks somewhat better. We have been using Hydrofera Blue here. 11/9; left foot larger than last week and [name] very necrotic surface. Right foot I think is about the same perhaps slightly smaller. Debris around the  circumference also addressed. Unfortunately on the left buttock there is been [name] decline. Satellite lesions below the major wound distally and now [name] an additional one posteriorly we have been using Hydrofera Blue but I think this is [name] pressure issue 11/16; left foot ulcer dorsally again [name] very adherent necrotic surface. Right foot is about the same. Not much change in the pressure ulcer on his left buttock. 11/30; left foot ulcer dorsally basically the same as when I saw him 2 [name] ago. Very adherent fibrinous debris on the wound surface. Patient reports [name] lot of drainage as well. 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 We have been using Iodoflex, patient changed back to alginate because of the drainage. Area on his right dorsal foot really looks benign with [name] healthier surface certainly [name] lot better than on the left. Left buttock  wounds all improved using Hydrofera Blue 12/7; left dorsal foot again no improvement. Tightly adherent debris. PCR culture I did last week only showed likely skin contaminant. I have gone ahead and done [name] punch biopsy of this which is about the last thing in terms of investigations I can think to do. He has known venous insufficiency and venous hypertension and this could be the issue here. The area on the right foot is about the same left buttock slightly worse according to our intake nurse secondary to [facility] Blue sticking to the wound 12/14; biopsy of the left foot that I did last time showed changes that could be related to wound healing/chronic stasis dermatitis phenomenon no neoplasm. We have been using silver alginate to both feet. I change the one on the left today to Sorbact and silver alginate to his other 2 wounds 12/28; the patient arrives with the following problems; ooMajor issue is the dorsal left foot which continues to be [name] larger deeper wound area. Still with [name] completely nonviable  surface ooParadoxically the area mirror image on the right on the right dorsal foot appears to be getting better. ooHe had some loss of dry denuded skin from the lower part of his original wound on the left lateral calf. Some of this area looked [name] little vulnerable and for this reason we put him in wrap that on this side this week ooThe area on his left buttock is larger. He still has the erythematous circular area which I think is [name] combination of pressure, sweat. This does not look like cellulitis or fungal dermatitis 11/26/2019; -Dorsal left foot large open wound with depth. Still debris over the surface. Using Sorbact ooThe area on the dorsal right foot paradoxically has closed over [facility] has [name] reopening on the left ankle laterally at the base of his original wound that extended up into the calf. This appears clean. ooThe left buttock wound is smaller but with very adherent necrotic debris over the surface. We have been using silver alginate here as well The patient had arterial studies done in 2017. He had biphasic waveforms at the dorsalis pedis and posterior tibial bilaterally. ABI in the left was 1.17. Digit waveforms were dampened. He has slight spasticity in the great toes I do not think [name] TBI would be possible 1/11; the patient comes in today with [name] sizable reopening between the first and second toes on the right. This is not exactly in the same location where we have been treating wounds previously. According to our intake nurse this was actually fairly deep but 0.6 cm. The area on the left dorsal foot looks about the same the surface is somewhat cleaner using Sorbact, his MRI is in 2 days. We have not managed yet to get arterial studies. The new reopening on the left lateral calf looks somewhat better using alginate. The left buttock wound is about the same using alginate 1/18; the patient had his ARTERIAL studies which were quite normal. ABI in the right at 1.13 with  triphasic/biphasic waveforms on the left ABI 1.06 again with triphasic/biphasic waveforms. It would not have been possible to have done [name] toe brachial index because of spasticity. We have been using Sorbac to the left foot alginate to the rest of his wounds on the right foot left lateral calf and left buttock 1/25; arrives in clinic with erythema and swelling of the left forefoot worse over the first MTP area. This extends laterally dorsally and but also posteriorly. Still has an  area on the left lateral part of the lower part of his calf wound it is eschared and clearly not closed. ooArea on the left buttock still with surrounding irritation and erythema. ooRight foot surface wound dorsally. The area between the right and first and second toes appears better. 2/1; ooThe left foot wound is about the same. Erythema slightly better I gave him [name] week of doxycycline empirically ooRight foot wound is more extensive extending between the toes to the plantar surface ooLeft lateral calf really no open surface on the inferior part of his original wound however the entire area still looks vulnerable ooAbsolutely no improvement in the left buttock wound required debridement. 2/8; the left foot is about the same. Erythema is slightly improved I gave him clindamycin last week. ooRight foot looks better he is using Lotrimin and silver alginate ooHe has [name] breakdown in the left lateral calf. Denuded epithelium which I have removed ooLeft buttock about the same were using Hydrofera Blue 2/15; left foot is about the same there is less surrounding erythema. Surface still has tightly adherent debris which I have debriding however not making any progress ooRight foot has [name] substantial wound on the medial right second toe between the first and second webspace. ooStill an open area on the left lateral calf distal area. ooButtock wound is about the same 2/22; left foot is about the same less surrounding  erythema. Surface has adherent debris. Polymen Ag Right foot area significant wound between the first and second toes. We have been using silver alginate here Left lateral leg polymen Ag at the base of his original venous insufficiency wound ooLeft buttock some improvement here 3/1; ooRight foot is deteriorating in the first second toe webspace. Larger and more substantial. We have been using silver alginate. ooLeft dorsal foot about the same markedly adherent surface debris using PolyMem Ag ooLeft lateral calf surface debris using PolyMem AG ooLeft buttock is improved again using PolyMem Ag. ooHe is completing his terbinafine. The erythema in the foot seems better. He has been on this for 2 [name] 3/8; no improvement in any wound area in fact he has [name] small open area on the dorsal midfoot which is new this week. He has not gotten his foot x-rays yet 3/15; his x-rays were both negative for osteomyelitis of both feet. No major change in any of his wounds on the extremities however his buttock wounds are better. We have been using polymen on the buttocks, left lower leg. Iodoflex on the left foot and silver alginate on the right 3/22; arrives in clinic today with the 2 major issues are the improvement in the left dorsal foot wound which for once actually looks healthy with [name] nice healthy wound surface without debridement. Using Iodoflex here. Unfortunately on the left lateral calf which is in the distal part of his original wound he came to the clinic here for there was purulent drainage noted some increased breakdown scattered around the original area and [name] small area proximally. We we are using polymen here will change to silver alginate today. His buttock wound on the left is better and I think the area on the right first second toe webspace is also improved 3/29; left dorsal foot looks better. Using Iodoflex. Left ankle culture from deterioration last time grew E. coli, Enterobacter and  Enterococcus. I will give him [name] course of cefdinir although that will not cover Enterococcus. The area on the right foot in the webspace of the first and second toe lateral  first toe looks better. The area on his buttock is about healed Vascular appointment is on April 21. This is to look at his venous system vis--vis continued breakdown of the wounds on the left including the left lateral leg and left dorsal foot he. He has had previous ablations on this side 4/5; the area between the right first and second toes lateral aspect of the first toe looks better. Dorsal aspect of the left first toe on the left foot also improved. Unfortunately the left lateral lower leg is larger and there is [name] second satellite wound superiorly. The usual superficial abrasions on the left buttock overall better but certainly not closed 4/12; the area between the right first and second toes is improved. Dorsal aspect of the left foot also slightly smaller with [name] vibrant healthy looking surface. No real change in the left lateral leg and the left buttock wound is healed He has an unaffordable co-pay for Apligraf. Appointment with vein and vascular with regards to the left leg venous part of the circulation is on 4/21 4/19; we continue to see improvement in all wound areas. Although this is minor. He has his vascular appointment on 4/21. The area on the left buttock has not reopened although right in the center of this area the skin looks somewhat threatened 4/26; the left buttock is unfortunately reopened. In general his left dorsal foot has [name] healthy surface and looks somewhat smaller although it was not measured as such. The area between his first and second toe webspace on the right as [name] small wound against the first toe. The patient saw vascular surgery. The real question I was asking was about the small saphenous vein on the left. He has previously ablated left greater saphenous vein. Nothing further was commented on  on the left. Right greater saphenous vein without reflux at the saphenofemoral junction or proximal thigh there was no indication for ablation of the right greater saphenous vein duplex was negative for DVT bilaterally. They did not think there was anything from [name] vascular surgery point of view that could be offered. They ABIs within normal limits 5/3; only small open area on the left buttock. The area on the left lateral leg which was his original venous reflux is now 2 wounds both which look clean. We are using Iodoflex on the left dorsal foot which looks healthy and smaller. He is down to [name] very tiny area between the right first and second toes, using silver alginate 5/10; all of his wounds appear better. We have much better edema control in 4 layer compression on the left. This may be the factor that is allowing the left foot and left lateral calf to heal. He has external compression garments at home 04/14/20-All of his wounds are progressing well, the left forefoot is practically closed, left ischium appears to be about the same, right toe webspace is also smaller. The left lateral leg is about the same, continue using Hydrofera Blue to this, silver alginate to the ischium, Iodoflex to the toe space on the right 6/7; most of his wounds outside of the left buttock are doing well. The area on the left lateral calf and left dorsal foot are smaller. The area on the right foot in between the first and second toe webspace is barely visible although he still says there is some drainage here is the only reason I did not heal this out. ooUnfortunately the area on the left buttock almost looks like he has [name] skin  tear from tape. He has open wound and then [name] large flap of skin that we are trying to get adherence over an area just next to the remaining wound 6/21; 2 week follow-up. I believe is been here for nurse visits. Miraculously the area between his first and second toes on the left dorsal foot is closed  over. Still open on the right first second web space. The left lateral calf has 2 open areas. Distally this is more superficial. The proximal area had [name] little more depth and required debridement of adherent necrotic material. His buttock wound is actually larger we have been using silver alginate here 6/28; the patient's area on the left foot remains closed. Still open wet area between the first and second toes on the right and also extending into the plantar aspect. We have been using silver alginate in this location. He has 2 areas on the left lower leg part of his original long wounds which I think are better. We have been using Hydrofera Blue here. Hydrofera Blue to the left buttock which is stable 7/12; left foot remains closed. Left ankle is closed. May be [name] small area between his right first and second toes the only truly open area is on the left buttock. We have been using Hydrofera Blue here 7/19; patient arrives with marked deterioration especially in the left foot and ankle. We did not put him in [name] compression wrap on the left last week in fact he wore his juxta lite stockings on either side although he does not have an underlying stocking. He has [name] reopening on the left dorsal foot, left lateral ankle and [name] new area on the right dorsal ankle. More worrisome is the degree of erythema on the left foot extending on the lateral foot into the lateral lower leg on the left 7/26; the patient had erythema and drainage from the lateral left ankle last week. Culture of this grew MRSA resistant to doxycycline and clindamycin which are the 2 antibiotics we usually use with this patient who has multiple antibiotic allergies including linezolid, trimethoprim sulfamethoxazole. I had give him an empiric doxycycline and he comes in the area certainly looks somewhat better although it is blotchy in his lower leg. He has not been systemically unwell. He has had areas on the left dorsal foot which is [name]  reopening, chronic wounds on the left lateral ankle. Both of these I think are secondary to chronic venous insufficiency. The area between his first and second toes is closed as far as I can tell. He had [name] new wrap injury on the right dorsal ankle last week. Finally he has an area on the left buttock. We have been using silver alginate to everything except the left buttock we are using Hydrofera Blue 06/30/20-Patient returns at 1 week, has been given [name] sample dose pack of NUZYRA which is [name] tetracycline derivative [omadacycline], patient has completed those, we have been using silver alginate to almost all the wounds except the left ischium where we are using Hydrofera Blue all of them look better 8/16; since I last saw the patient he has been doing well. The area on the left buttock, left lateral ankle and left foot are all closed today. He has completed the [place] I gave him last time and tolerated this well. He still has open areas on the right dorsal ankle and in the right first second toe area which we are using silver alginate. 8/23; we put him in his bilateral external  compression stockings last week as he did not have anything open on either leg except for concerning area between the right first and second toe. He comes in today with an area on the left dorsal foot slightly more proximal than the original wound, the left lateral foot but this is actually [name] continuation of the area he had on the left lateral ankle from last time. As well he is opened up on the left buttock again. Objective Constitutional Sitting or standing Blood Pressure is within target range for patient.. Pulse regular and within target range for patient.Marland Kitchen Respirations regular, non-labored and within target range.. Temperature is normal and within the target range for the patient.Marland Kitchen Appears in no distress. Vitals Time Taken: 7:54 AM, Height: 70 in, Weight: 216 lbs, BMI: 31, Temperature: 98.2 F, Pulse: 112 bpm, Respiratory  Rate: 18 breaths/min, Blood Pressure: 120/77 mmHg. Cardiovascular Pedal pulses are palpable. No major problems with edema. General Notes: Wound exam; the left buttock is reopened and the small area. There is dry, excoriated, irritated around the area -He has [name] new open area on the left lateral foot however this is an continuation of the area that was present on his ankle that we only healed out 2 [name] ago. However it is not in the same spot ooSimilarly on the dorsal left foot he has [name] new open area but proximal from the original wound ooThere is nothing really open on the right foot ooArea on the right ankle appears to be getting smaller Integumentary (Hair, Skin) Wound #38 status is Open. Original cause of wound was Gradually Appeared. The wound is located on the Right T - Web between 1st and 2nd. The wound oe measures 0.5cm length x 0.5cm width x 0.1cm depth; 0.196cm^2 area and 0.02cm^3 volume. There is Fat Layer (Subcutaneous Tissue) exposed. There is no tunneling or undermining noted. There is [name] small amount of serosanguineous drainage noted. The wound margin is distinct with the outline attached to the wound base. There is large (67-100%) red granulation within the wound bed. There is no necrotic tissue within the wound bed. Wound #41R status is Open. Original cause of wound was Gradually Appeared. The wound is located on the Left Ischium. The wound measures 0.5cm length x 0.6cm width x 0.1cm depth; 0.236cm^2 area and 0.024cm^3 volume. There is Fat Layer (Subcutaneous Tissue) exposed. There is no tunneling or undermining noted. There is [name] small amount of serosanguineous drainage noted. The wound margin is distinct with the outline attached to the wound base. There is large (67- 100%) red, pink granulation within the wound bed. There is no necrotic tissue within the wound bed. Wound #42 status is Open. Original cause of wound was Gradually Appeared. The wound is located on the Right,Dorsal  Ankle. The wound measures 0.5cm length x 0.5cm width x 0.1cm depth; 0.196cm^2 area and 0.02cm^3 volume. There is Fat Layer (Subcutaneous Tissue) exposed. There is no tunneling or undermining noted. There is [name] medium amount of serosanguineous drainage noted. The wound margin is flat and intact. There is large (67-100%) red, friable granulation within the wound bed. There is no necrotic tissue within the wound bed. Wound #44 status is Open. Original cause of wound was Gradually Appeared. The wound is located on the Left,Medial Foot. The wound measures 0.7cm length x 1.3cm width x 0.1cm depth; 0.715cm^2 area and 0.071cm^3 volume. There is Fat Layer (Subcutaneous Tissue) exposed. There is no tunneling or undermining noted. There is [name] small amount of serosanguineous drainage noted. The wound  margin is distinct with the outline attached to the wound base. There is large (67- 100%) red, pink granulation within the wound bed. There is no necrotic tissue within the wound bed. Wound #45 status is Open. Original cause of wound was Gradually Appeared. The wound is located on the Left,Lateral Foot. The wound measures 1.7cm length x 0.5cm width x 0.1cm depth; 0.668cm^2 area and 0.067cm^3 volume. There is Fat Layer (Subcutaneous Tissue) exposed. There is no tunneling or undermining noted. There is [name] small amount of serosanguineous drainage noted. The wound margin is distinct with the outline attached to the wound base. There is large (67- 100%) red, pink granulation within the wound bed. There is no necrotic tissue within the wound bed. Assessment Active Problems ICD-10 Chronic venous hypertension (idiopathic) with ulcer and inflammation of left lower extremity 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 to breakdown of skin Non-pressure chronic ulcer of right ankle limited to breakdown of skin Non-pressure chronic ulcer of other part of right foot limited to breakdown of skin Non-pressure chronic ulcer of  other part of left foot limited to breakdown of skin Cellulitis of left lower limb Pressure ulcer of left buttock, stage 3 Paraplegia, complete Procedures Wound #44 Pre-procedure diagnosis of Wound #44 is an Inflammatory located on the Left,Medial Foot . There was [name] Four Layer Compression Therapy Procedure by Levan Hurst, RN. Post procedure Diagnosis Wound #44: Same as Pre-Procedure Wound #45 Pre-procedure diagnosis of Wound #45 is an Inflammatory located on the Left,Lateral Foot . There was [name] Four Layer Compression Therapy Procedure by Levan Hurst, RN. Post procedure Diagnosis Wound #45: Same as Pre-Procedure Plan Follow-up Appointments: Return Appointment in 1 week. Dressing Change Frequency: Wound #38 Right T - Web between 1st and 2nd: oe Change Dressing every other day. Wound #41R Left Ischium: Change Dressing every other day. Wound #42 Right,Dorsal Ankle: Change Dressing every other day. Wound #44 Left,Medial Foot: Do not change entire dressing for one week. Wound #45 Left,Lateral Foot: Do not change entire dressing for one week. Skin Barriers/Peri-Wound Care: Moisturizing lotion Wound Cleansing: May shower and wash wound with soap and water. - on days that dressing is changed Primary Wound Dressing: Wound #38 Right T - Web between 1st and 2nd: oe Calcium Alginate with Silver Wound #41R Left Ischium: Calcium Alginate with Silver Wound #42 Right,Dorsal Ankle: Calcium Alginate with Silver Wound #44 Left,Medial Foot: Calcium Alginate with Silver Wound #45 Left,Lateral Foot: Calcium Alginate with Silver Secondary Dressing: Wound #38 Right T - Web between 1st and 2nd: oe Kerlix/Rolled Gauze Dry Gauze Wound #42 Right,Dorsal Ankle: Dry Gauze Wound #44 Left,Medial Foot: Dry Gauze Wound #45 Left,Lateral Foot: Dry Gauze Wound #41R Left Ischium: Foam Border - or ABD pad and tape Edema Control: 4 layer compression: Left lower extremity Elevate legs to the  level of the heart or above for 30 minutes daily and/or when sitting, [name] frequency of: - throughout the day Support Garment 30-40 mm/Hg pressure to: - Juxtalite to right leg daily Off-Loading: Low air-loss mattress (Group 2) Roho cushion for wheelchair Turn and reposition every 2 hours - out of wheelchair throughout the day, try to lay on sides, sleep in the bed not the recliner 1. I am continuing with silver alginate to all wound areas 2. Back in 3 layer compression bilaterally. We did not wrap anything last week. 3. Nothing required debridement here. 4. I am at [name] loss of what to suggest to the recurrence of the wounds in his lower extremities which I  think is all mostly chronic venous hypertension. He has had previous ablations 5. Similarly the skin on his buttock is dry red irritated for [name] large amount of area over the small wound nevertheless the skin in this area does not seem to be able to hold together Electronic Signature(s) Signed: 07/14/2020 4:33:08 PM By: Linton Ham MD Entered By: Linton Ham on 07/14/2020 08:36:34 -------------------------------------------------------------------------------- SuperBill Details Patient Name: Date of Service: Imel, [name] 07/14/2020 Medical Record Number: [id number] Patient Account Number: [id number] Date of Birth/Sex: Treating RN: [birth year] (34 y.o. [name] Primary Care Provider: Stanley, [place] Other Clinician: Referring Provider: Treating Provider/Extender: [name] in Treatment: 236 Diagnosis Coding ICD-10 Codes Code Description I87.332 Chronic venous hypertension (idiopathic) with ulcer and inflammation of left lower extremity L97.321 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 to breakdown of skin L97.311 Non-pressure chronic ulcer of right ankle limited to breakdown of skin L97.511 Non-pressure chronic ulcer of other part of right foot limited to breakdown of skin L97.521  Non-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 L03.116 Cellulitis of left lower limb 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, 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, complete Facility Procedures CPT4 Code: 85277824 Description: (Facility Use Only) 29581LT - APPLY MULTLAY COMPRS LWR LT LEG Modifier: Quantity: 1 Physician Procedures : CPT4 Code Description Modifier [id number] - WC PHYS LEVEL 3 - EST PT ICD-10 Diagnosis Description L97.321 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 to breakdown of skin L97.311 Non-pressure chronic ulcer of right ankle limited to breakdown of  skin L97.521 Non-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 Quantity: 1 Electronic Signature(s) Signed: 07/14/2020 4:21:43 PM By: Levan Hurst RN, BSN Signed: 07/14/2020 4:33:08 PM By: Linton Ham MD Entered By: Levan Hurst on 07/14/2020 08:51:57

## 2020-07-19 ENCOUNTER — Other Ambulatory Visit: Payer: Self-pay | Admitting: Psychiatry

## 2020-07-19 DIAGNOSIS — F341 Dysthymic disorder: Secondary | ICD-10-CM

## 2020-07-19 NOTE — Progress Notes (Signed)
[name] [name] GAGE ([id number]) Visit Report for 07/07/2020 Arrival Information Details Patient Name: Date of Service: [name] [name] [name] 07/07/2020 8:00 [name] [name] Medical Record Number: [id number] Patient Account Number: [id number] Date of Birth/Sex: Treating RN: 09-Sep-1986 (34 y.o. [name] [name] Primary Care [name] [name]: [name], [name] Other Clinician: Referring [name] [name]: Treating [name] [name]/Extender: [name] [name] in Treatment: 75 Visit Information History Since Last Visit Added or deleted any medications: No Patient Arrived: Wheel Chair Any new allergies or adverse reactions: No Arrival Time: 08:13 Had [name] fall or experienced change in No Accompanied By: self activities of daily living that may affect Transfer Assistance: None risk of falls: Patient Identification Verified: Yes Signs or symptoms of abuse/neglect since last visito No Secondary Verification Process Completed: Yes Hospitalized since last visit: No Patient Requires Transmission-Based Precautions: No Implantable device outside of the clinic excluding No Patient Has Alerts: Yes cellular tissue based products placed in the center Patient Alerts: R ABI = 1.0 since last visit: L ABI = 1.1 Has Dressing in Place as Prescribed: Yes Has Compression in Place as Prescribed: Yes Pain Present Now: No Electronic Signature(s) Signed: 07/08/2020 5:25:51 PM By: Baruch Gouty RN, BSN Entered By: Baruch Gouty on 07/07/2020 08:14:25 -------------------------------------------------------------------------------- Clinic Level of Care Assessment Details Patient Name: Date of Service: [name] [name] [name] 07/07/2020 8:00 [name] [name] Medical Record Number: [id number] Patient Account Number: [id number] Date of Birth/Sex: Treating RN: [birth year] (34 y.o. [name] [name] Primary Care [name] [name]: [place], [place] Other Clinician: Referring [name] [name]: Treating [name] [name]/Extender: [name] [name] in Treatment: 235 Clinic  Level of Care Assessment Items TOOL 4 Quantity Score X- 1 0 Use when only an EandM is performed on FOLLOW-UP visit ASSESSMENTS - Nursing Assessment / Reassessment X- 1 10 Reassessment of Co-morbidities (includes updates in patient status) X- 1 5 Reassessment of Adherence to Treatment Plan ASSESSMENTS - Wound and Skin [name] ssessment / Reassessment []  - 0 Simple Wound Assessment / Reassessment - one wound X- 2 5 Complex Wound Assessment / Reassessment - multiple wounds []  - 0 Dermatologic / Skin Assessment (not related to wound area) ASSESSMENTS - Focused Assessment []  - 0 Circumferential Edema Measurements - multi extremities []  - 0 Nutritional Assessment / Counseling / Intervention X- 1 5 Lower Extremity Assessment (monofilament, tuning fork, pulses) []  - 0 Peripheral Arterial Disease Assessment (using hand held doppler) ASSESSMENTS - Ostomy and/or Continence Assessment and Care []  - 0 Incontinence Assessment and Management []  - 0 Ostomy Care Assessment and Management (repouching, etc.) PROCESS - Coordination of Care X - Simple Patient / Family Education for ongoing care 1 15 []  - 0 Complex (extensive) Patient / Family Education for ongoing care X- 1 10 Staff obtains Programmer, systems, Records, T Results / Process Orders est []  - 0 Staff telephones HHA, Nursing Homes / Clarify orders / etc []  - 0 Routine Transfer to another Facility (non-emergent condition) []  - 0 Routine Hospital Admission (non-emergent condition) []  - 0 New Admissions / Biomedical engineer / Ordering NPWT Apligraf, etc. , []  - 0 Emergency Hospital Admission (emergent condition) X- 1 10 Simple Discharge Coordination []  - 0 Complex (extensive) Discharge Coordination PROCESS - Special Needs []  - 0 Pediatric / Minor Patient Management []  - 0 Isolation Patient Management []  - 0 Hearing / Language / Visual special needs []  - 0 Assessment of Community assistance (transportation, D/C planning, etc.) []   - 0 Additional assistance / Altered mentation []  - 0 Support Surface(s) Assessment (bed, cushion, seat, etc.) INTERVENTIONS - Wound Cleansing / Measurement []  -  0 Simple Wound Cleansing - one wound X- 2 5 Complex Wound Cleansing - multiple wounds X- 1 5 Wound Imaging (photographs - any number of wounds) []  - 0 Wound Tracing (instead of photographs) []  - 0 Simple Wound Measurement - one wound X- 2 5 Complex Wound Measurement - multiple wounds INTERVENTIONS - Wound Dressings X - Small Wound Dressing one or multiple wounds 2 10 []  - 0 Medium Wound Dressing one or multiple wounds []  - 0 Large Wound Dressing one or multiple wounds X- 1 5 Application of Medications - topical []  - 0 Application of Medications - injection INTERVENTIONS - Miscellaneous []  - 0 External ear exam []  - 0 Specimen Collection (cultures, biopsies, blood, body fluids, etc.) []  - 0 Specimen(s) / Culture(s) sent or taken to Lab for analysis []  - 0 Patient Transfer (multiple staff / Civil Service fast streamer / Similar devices) []  - 0 Simple Staple / Suture removal (25 or less) []  - 0 Complex Staple / Suture removal (26 or more) []  - 0 Hypo / Hyperglycemic Management (close monitor of Blood Glucose) []  - 0 Ankle / Brachial Index (ABI) - do not check if billed separately X- 1 5 Vital Signs Has the patient been seen at the hospital within the last three years: Yes Total Score: 120 Level Of Care: New/Established - Level 4 Electronic Signature(s) Signed: 07/07/2020 5:14:32 PM By: Levan Hurst RN, BSN Entered By: Levan Hurst on 07/07/2020 09:43:55 -------------------------------------------------------------------------------- Encounter Discharge Information Details Patient Name: Date of Service: [name] [name] [name] 07/07/2020 8:00 [name] [name] Medical Record Number: [id number] Patient Account Number: [id number] Date of Birth/Sex: Treating RN: Apr 15, 1986 (34 y.o. [name] Primary Care [name] [name]: [place], [place] Other  Clinician: Referring Sameen Leas: Treating Jaliyah Fotheringham/Extender: [name] [name] in Treatment: 235 Encounter Discharge Information Items Discharge Condition: Stable Ambulatory Status: Wheelchair Discharge Destination: Home Transportation: Private Auto Accompanied By: self Schedule Follow-up Appointment: Yes Clinical Summary of Care: Patient Declined Electronic Signature(s) Signed: 07/07/2020 5:24:06 PM By: Kela Millin Entered By: Kela Millin on 07/07/2020 09:17:17 -------------------------------------------------------------------------------- Lower Extremity Assessment Details Patient Name: Date of Service: [name] [name] [name] 07/07/2020 8:00 [name] [name] Medical Record Number: [id number] Patient Account Number: [id number] Date of Birth/Sex: Treating RN: 1986/05/19 (34 y.o. [name] Primary Care Christasia Angeletti: [place], [place] Other Clinician: Referring Dekari Bures: Treating Ryun Velez/Extender: [name] [name] in Treatment: 235 Edema Assessment Assessed: [Left: No] [Right: No] Edema: [Left: No] [Right: Yes] Calf Left: Right: Point of Measurement: 33 cm From Medial Instep 26.5 cm 32.4 cm Ankle Left: Right: Point of Measurement: 10 cm From Medial Instep 23.6 cm 23.5 cm Electronic Signature(s) Signed: 07/18/2020 5:50:16 PM By: Carlene Coria RN Entered By: Carlene Coria on 07/07/2020 08:27:11 -------------------------------------------------------------------------------- Multi Wound Chart Details Patient Name: Date of Service: [name] [name] [name] 07/07/2020 8:00 [name] [name] Medical Record Number: [id number] Patient Account Number: [id number] Date of Birth/Sex: Treating RN: 1986-09-05 (34 y.o. [name] [name] Primary Care Ronnita Paz: [name], [name] Other Clinician: Referring Dietra Stokely: Treating Mandell Pangborn/Extender: [name] [name] in Treatment: 235 Vital Signs Height(in): 70 Pulse(bpm): 103 Weight(lbs): 216 Blood Pressure(mmHg):  127/78 Body Mass Index(BMI): 31 Temperature(F): 98.5 Respiratory Rate(breaths/min): 18 Photos: [37R:No Photos Left, Lateral Malleolus] [38:No 25 Right T - Web between 1st and 2nd Left Ischium oe] [41:No Photos] Wound Location: [37R:Gradually Appeared] [38:Gradually Appeared] [41:Gradually Appeared] Wounding Event: [37R:Venous Leg Ulcer] [38:Inflammatory] [41:Pressure Ulcer] Primary Etiology: [37R:Sleep Apnea, Hypertension, Paraplegia Sleep Apnea, Hypertension, Paraplegia Sleep Apnea, Hypertension, Paraplegia] Comorbid History: [37R:11/26/2019] [38:11/30/2019] [41:03/16/2020] Date Acquired: [37R:32] [38:31] [  41:16] [name] of Treatment: [37R:Open] [38:Open] [41:Open] Wound Status: [37R:Yes] [38:No] [41:No] Wound Recurrence: [37R:0x0x0] [38:0.6x0.1x0.5] [41:0x0x0] Measurements L x W x D (cm) [37R:0] [38:0.047] [41:0] [name] (cm) : rea [37R:0] [38:0.024] [41:0] Volume (cm) : [37R:100.00%] [38:85.80%] [41:100.00%] % Reduction in [name] rea: [37R:100.00%] [38:89.60%] [41:100.00%] % Reduction in Volume: [37R:Full Thickness Without Exposed] [38:Full Thickness Without Exposed] [41:Category/Stage II] Classification: [37R:Support Structures None Present] [38:Support Structures Small] [41:None Present] Exudate Amount: [37R:N/[name]] [38:Serosanguineous] [41:N/[name]] Exudate Type: [37R:N/[name]] [38:red, brown] [41:N/[name]] Exudate Color: [37R:Flat and Intact] [38:Distinct, outline attached] [41:Distinct, outline attached] Wound Margin: [37R:None Present (0%)] [38:Large (67-100%)] [41:None Present (0%)] Granulation Amount: [37R:N/[name]] [38:Red] [41:N/[name]] Granulation Quality: [37R:None Present (0%)] [38:None Present (0%)] [41:None Present (0%)] Necrotic Amount: [37R:Fascia: No] [38:Fat Layer (Subcutaneous Tissue): Yes Fascia: No] Exposed Structures: [37R:Fat Layer (Subcutaneous Tissue): No Tendon: No Muscle: No Joint: No Bone: No Large (67-100%)] [38:Large (67-100%)] [41:Fat Layer (Subcutaneous Tissue): No Tendon: No Muscle: No  Joint: No Bone: No Large (67-100%)] Wound Number: [id number] N/[name] Photos: No Photos No Photos N/[name] Right, Dorsal Ankle Left, Dorsal Foot N/[name] Wound Location: Gradually Appeared Gradually Appeared N/[name] Wounding Event: Venous Leg Ulcer Inflammatory N/[name] Primary Etiology: Sleep Apnea, Hypertension, Paraplegia Sleep Apnea, Hypertension, Paraplegia N/[name] Comorbid History: 06/06/2020 06/06/2020 N/[name] Date Acquired: 4 4 N/[name] [name] of Treatment: Open Open N/[name] Wound Status: No No N/[name] Wound Recurrence: 1x0.8x0.1 0x0x0 N/[name] Measurements L x W x D (cm) 0.628 0 N/[name] [name] (cm) : rea 0.063 0 N/[name] Volume (cm) : 46.70% 100.00% N/[name] % Reduction in [name] rea: 46.60% 100.00% N/[name] % Reduction in Volume: Full Thickness Without Exposed Full Thickness Without Exposed N/[name] Classification: Support Structures Support Structures Medium None Present N/[name] Exudate Amount: Serosanguineous N/[name] N/[name] Exudate Type: red, brown N/[name] N/[name] Exudate Color: Flat and Intact Indistinct, nonvisible N/[name] Wound Margin: Large (67-100%) None Present (0%) N/[name] Granulation Amount: Red N/[name] N/[name] Granulation Quality: Small (1-33%) None Present (0%) N/[name] Necrotic Amount: Fat Layer (Subcutaneous Tissue): Yes Fascia: No N/[name] Exposed Structures: Fascia: No Fat Layer (Subcutaneous Tissue): No Tendon: No Tendon: No Muscle: No Muscle: No Joint: No Joint: No Bone: No Bone: No Small (1-33%) Large (67-100%) N/[name] Epithelialization: Treatment Notes Wound #[id number] (Right Toe - Web between 1st and 2nd) 1. Cleanse With Wound Cleanser Soap and water 3. Primary Dressing Applied Calcium Alginate Ag 4. Secondary Dressing Dry Gauze Roll Gauze 5. Secured With Tape Notes applied ace wrap to left leg until patient gets home to apply juxtalite Wound #[id number] (Right, Dorsal Ankle) 1. Cleanse With Wound Cleanser Soap and water 3. Primary Dressing Applied Calcium Alginate Ag 4. Secondary Dressing Dry Gauze Roll Gauze 5. Secured With Tape Notes applied ace  wrap to left leg until patient gets home to apply [employer]) Signed: 07/07/2020 5:14:32 PM By: Levan Hurst RN, BSN Signed: 07/07/2020 5:53:04 PM By: Linton Ham MD Entered By: Linton Ham on 07/07/2020 09:41:27 -------------------------------------------------------------------------------- Multi-Disciplinary Care Plan Details Patient Name: Date of Service: [name] [name] [name] 07/07/2020 8:00 [name] [name] Medical Record Number: [id number] Patient Account Number: [id number] Date of Birth/Sex: Treating RN: 1986/08/16 (34 y.o. [name] [name] Primary Care Chrishawn Kring: [name], [name] Other Clinician: Referring Jerimie Mancuso: Treating Jakaleb Payer/Extender: [name] [name] in Treatment: 235 Active Inactive Wound/Skin Impairment Nursing Diagnoses: Impaired tissue integrity Knowledge deficit related to ulceration/compromised skin integrity Goals: Patient/caregiver will verbalize understanding of skin care regimen Date Initiated: 01/05/2016 Target Resolution Date: 08/08/2020 Goal Status: Active Ulcer/skin breakdown will have [name] volume reduction of 30% by week 4 Date Initiated: 01/05/2016 Date  Inactivated: 12/22/2017 Target Resolution Date: 01/19/2018 Unmet Reason: complex wounds, Goal Status: Unmet infection Interventions: Assess patient/caregiver ability to obtain necessary supplies Assess ulceration(s) every visit Provide education on ulcer and skin care Notes: Electronic Signature(s) Signed: 07/07/2020 5:14:32 PM By: Levan Hurst RN, BSN Entered By: Levan Hurst on 07/07/2020 09:09:57 -------------------------------------------------------------------------------- Pain Assessment Details Patient Name: Date of Service: [name] [name] [name] 07/07/2020 8:00 [name] [name] Medical Record Number: [id number] Patient Account Number: [id number] Date of Birth/Sex: Treating RN: Apr 16, 1986 (34 y.o. [name] [name] Primary Care Jaesean Litzau: [place], [place] Other  Clinician: Referring Guerino Caporale: Treating Georgiana Spillane/Extender: [name] [name] in Treatment: 235 Active Problems Location of Pain Severity and Description of Pain Patient Has Paino No Site Locations Rate the pain. Current Pain Level: 0 Pain Management and Medication Current Pain Management: Electronic Signature(s) Signed: 07/08/2020 5:25:51 PM By: Baruch Gouty RN, BSN Entered By: Baruch Gouty on 07/07/2020 08:15:17 -------------------------------------------------------------------------------- Patient/Caregiver Education Details Patient Name: Date of Service: [name] [name] Viviann Spare 8/16/2021andnbsp8:00 [name] [name] Medical Record Number: [id number] Patient Account Number: [id number] Date of Birth/Gender: Treating RN: February 08, 1986 (34 y.o. [name] [name] Primary Care Physician: Janine Limbo Other Clinician: Referring Physician: Treating Physician/Extender: [name] [name] in Treatment: 42 Education Assessment Education Provided To: Patient Education Topics Provided Wound/Skin Impairment: Methods: Explain/Verbal Responses: State content correctly Electronic Signature(s) Signed: 07/07/2020 5:14:32 PM By: Levan Hurst RN, BSN Entered By: Levan Hurst on 07/07/2020 09:10:18 -------------------------------------------------------------------------------- Wound Assessment Details Patient Name: Date of Service: [name] [name] [name] 07/07/2020 8:00 [name] [name] Medical Record Number: [id number] Patient Account Number: [id number] Date of Birth/Sex: Treating RN: 09/26/1986 (34 y.o. [name]) Carlene Coria Primary Care Zyaire Dumas: [name], [name] Other Clinician: Referring Domonic Hiscox: Treating Zamar Odwyer/Extender: [name] [name] in Treatment: 235 Wound Status Wound Number: [id number] Primary Etiology: Venous Leg Ulcer Wound Location: Left, Lateral Malleolus Wound Status: Open Wounding Event: Gradually Appeared Comorbid History: Sleep Apnea, Hypertension,  Paraplegia Date Acquired: 11/26/2019 [name] Of Treatment: 32 Clustered Wound: No Photos Photo Uploaded By: Mikeal Hawthorne on 07/08/2020 16:08:27 Wound Measurements Length: (cm) Width: (cm) Depth: (cm) Area: (cm) Volume: (cm) 0 % Reduction in Area: 100% 0 % Reduction in Volume: 100% 0 Epithelialization: Large (67-100%) 0 Tunneling: No 0 Undermining: No Wound Description Classification: Full Thickness Without Exposed Support Structures Wound Margin: Flat and Intact Exudate Amount: None Present Foul Odor After Cleansing: No Slough/Fibrino No Wound Bed Granulation Amount: None Present (0%) Exposed Structure Necrotic Amount: None Present (0%) Fascia Exposed: No Fat Layer (Subcutaneous Tissue) Exposed: No Tendon Exposed: No Muscle Exposed: No Joint Exposed: No Bone Exposed: No Electronic Signature(s) Signed: 07/18/2020 5:50:16 PM By: Carlene Coria RN Entered By: Carlene Coria on 07/07/2020 08:30:13 -------------------------------------------------------------------------------- Wound Assessment Details Patient Name: Date of Service: [name] [name] [name] 07/07/2020 8:00 [name] [name] Medical Record Number: [id number] Patient Account Number: [id number] Date of Birth/Sex: Treating RN: 1986-04-20 (34 y.o. [name]) Carlene Coria Primary Care Lailany Enoch: [place], [place] Other Clinician: Referring Keyaan Lederman: Treating Maveric Debono/Extender: [name] [name] in Treatment: 235 Wound Status Wound Number: [id number] Primary Etiology: Inflammatory Wound Location: Right T - Web between 1st and 2nd oe Wound Status: Open Wounding Event: Gradually Appeared Comorbid History: Sleep Apnea, Hypertension, Paraplegia Date Acquired: 11/30/2019 [name] Of Treatment: 31 Clustered Wound: No Photos Photo Uploaded By: Mikeal Hawthorne on 07/08/2020 16:10:19 Wound Measurements Length: (cm) 0.6 Width: (cm) 0.1 Depth: (cm) 0.5 Area: (cm) 0.047 Volume: (cm) 0.024 % Reduction in Area: 85.8% % Reduction in Volume:  89.6% Epithelialization: Large (67-100%) Tunneling: No Undermining: No  Wound Description Classification: Full Thickness Without Exposed Support Structures Wound Margin: Distinct, outline attached Exudate Amount: Small Exudate Type: Serosanguineous Exudate Color: red, brown Foul Odor After Cleansing: No Slough/Fibrino Yes Wound Bed Granulation Amount: Large (67-100%) Exposed Structure Granulation Quality: Red Fat Layer (Subcutaneous Tissue) Exposed: Yes Necrotic Amount: None Present (0%) Electronic Signature(s) Signed: 07/18/2020 5:50:16 PM By: Carlene Coria RN Entered By: Carlene Coria on 07/07/2020 08:30:45 -------------------------------------------------------------------------------- Wound Assessment Details Patient Name: Date of Service: [name] [name] [name] 07/07/2020 8:00 [name] [name] Medical Record Number: [id number] Patient Account Number: [id number] Date of Birth/Sex: Treating RN: 04-Dec-1985 (34 y.o. [name]) Carlene Coria Primary Care Eulalia Ellerman: [name], [name] Other Clinician: Referring Ozie Lupe: Treating Manhattan Mccuen/Extender: Dutch Gray, [name] in Treatment: 235 Wound Status Wound Number: [id number] Primary Etiology: Pressure Ulcer Wound Location: Left Ischium Wound Status: Open Wounding Event: Gradually Appeared Comorbid History: Sleep Apnea, Hypertension, Paraplegia Date Acquired: 03/16/2020 [name] Of Treatment: 16 Clustered Wound: No Photos Photo Uploaded By: Mikeal Hawthorne on 07/08/2020 16:07:36 Wound Measurements Length: (cm) Width: (cm) Depth: (cm) Area: (cm) Volume: (cm) 0 % Reduction in Area: 100% 0 % Reduction in Volume: 100% 0 Epithelialization: Large (67-100%) 0 Tunneling: No 0 Undermining: No Wound Description Classification: Category/Stage II Wound Margin: Distinct, outline attached Exudate Amount: None Present Foul Odor After Cleansing: No Slough/Fibrino No Wound Bed Granulation Amount: None Present (0%) Exposed Structure Necrotic Amount: None  Present (0%) Fascia Exposed: No Fat Layer (Subcutaneous Tissue) Exposed: No Tendon Exposed: No Muscle Exposed: No Joint Exposed: No Bone Exposed: No Electronic Signature(s) Signed: 07/18/2020 5:50:16 PM By: Carlene Coria RN Entered By: Carlene Coria on 07/07/2020 08:32:25 -------------------------------------------------------------------------------- Wound Assessment Details Patient Name: Date of Service: [place], [name] [name] 07/07/2020 8:00 [name] [name] Medical Record Number: [id number] Patient Account Number: [id number] Date of Birth/Sex: Treating RN: 1986-09-02 (34 y.o. [name]) Carlene Coria Primary Care Elishua Radford: [name], [name] Other Clinician: Referring Eamon Tantillo: Treating Francessca Friis/Extender: [name] [name] in Treatment: 235 Wound Status Wound Number: [id number] Primary Etiology: Venous Leg Ulcer Wound Location: Right, Dorsal Ankle Wound Status: Open Wounding Event: Gradually Appeared Comorbid History: Sleep Apnea, Hypertension, Paraplegia Date Acquired: 06/06/2020 [name] Of Treatment: 4 Clustered Wound: No Photos Photo Uploaded By: Mikeal Hawthorne on 07/08/2020 16:08:57 Wound Measurements Length: (cm) 1 Width: (cm) 0.8 Depth: (cm) 0.1 Area: (cm) 0.628 Volume: (cm) 0.063 % Reduction in Area: 46.7% % Reduction in Volume: 46.6% Epithelialization: Small (1-33%) Tunneling: No Undermining: No Wound Description Classification: Full Thickness Without Exposed Support Structures Wound Margin: Flat and Intact Exudate Amount: Medium Exudate Type: Serosanguineous Exudate Color: red, brown Foul Odor After Cleansing: No Slough/Fibrino Yes Wound Bed Granulation Amount: Large (67-100%) Exposed Structure Granulation Quality: Red Fascia Exposed: No Necrotic Amount: Small (1-33%) Fat Layer (Subcutaneous Tissue) Exposed: Yes Necrotic Quality: Adherent Slough Tendon Exposed: No Muscle Exposed: No Joint Exposed: No Bone Exposed: No Electronic Signature(s) Signed: 07/18/2020 5:50:16  PM By: Carlene Coria RN Entered By: Carlene Coria on 07/07/2020 08:31:04 -------------------------------------------------------------------------------- Wound Assessment Details Patient Name: Date of Service: Coriz, [name] [name] 07/07/2020 8:00 [name] [name] Medical Record Number: [id number] Patient Account Number: [id number] Date of Birth/Sex: Treating RN: 1986/03/10 (34 y.o. [name]) Carlene Coria Primary Care Feather Berrie: [place], [place] Other Clinician: Referring Tanicka Bisaillon: Treating Arend Bahl/Extender: [name] [name] in Treatment: 235 Wound Status Wound Number: [id number] Primary Etiology: Inflammatory Wound Location: Left, Dorsal Foot Wound Status: Open Wounding Event: Gradually Appeared Comorbid History: Sleep Apnea, Hypertension, Paraplegia Date Acquired: 06/06/2020 [name] Of Treatment: 4 Clustered Wound: No Photos Photo Uploaded By: Mikeal Hawthorne  on 07/08/2020 16:08:57 Wound Measurements Length: (cm) Width: (cm) Depth: (cm) Area: (cm) Volume: (cm) 0 % Reduction in Area: 100% 0 % Reduction in Volume: 100% 0 Epithelialization: Large (67-100%) 0 Tunneling: No 0 Undermining: No Wound Description Classification: Full Thickness Without Exposed Support Structures Wound Margin: Indistinct, nonvisible Exudate Amount: None Present Foul Odor After Cleansing: No Slough/Fibrino No Wound Bed Granulation Amount: None Present (0%) Exposed Structure Necrotic Amount: None Present (0%) Fascia Exposed: No Fat Layer (Subcutaneous Tissue) Exposed: No Tendon Exposed: No Muscle Exposed: No Joint Exposed: No Bone Exposed: No Electronic Signature(s) Signed: 07/18/2020 5:50:16 PM By: Carlene Coria RN Entered By: Carlene Coria on 07/07/2020 08:31:30 -------------------------------------------------------------------------------- [place] Details Patient Name: Date of Service: Villar[place]el, [name] [name] 07/07/2020 8:00 [name] [name] Medical Record Number: [id number] Patient Account Number: [id number] Date of  Birth/Sex: Treating RN: Apr 11, 1986 (34 y.o. [name] [name] Primary Care Quana Chamberlain: [place], [place] Other Clinician: Referring Kateria Cutrona: Treating Maily Debarge/Extender: [name] [name] in Treatment: 235 Vital Signs Time Taken: 08:14 Temperature (F): 98.5 Height (in): 70 Pulse (bpm): 103 Source: Stated Respiratory Rate (breaths/min): 18 Weight (lbs): 216 Blood Pressure (mmHg): 127/78 Source: Stated Reference Range: 80 - 120 mg / dl Body Mass Index (BMI): 31 Electronic Signature(s) Signed: 07/08/2020 5:25:51 PM By: Baruch Gouty RN, BSN Entered By: Baruch Gouty on 07/07/2020 08:15:07

## 2020-07-20 NOTE — Telephone Encounter (Signed)
Last visit Dec 2020

## 2020-07-21 ENCOUNTER — Encounter (HOSPITAL_BASED_OUTPATIENT_CLINIC_OR_DEPARTMENT_OTHER): Payer: BC Managed Care – PPO | Admitting: Internal Medicine

## 2020-07-21 DIAGNOSIS — L89323 Pressure ulcer of left buttock, stage 3: Secondary | ICD-10-CM | POA: Diagnosis not present

## 2020-07-21 DIAGNOSIS — L97321 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 to breakdown of skin: Secondary | ICD-10-CM | POA: Diagnosis not present

## 2020-07-21 DIAGNOSIS — L03116 Cellulitis of left lower limb: Secondary | ICD-10-CM | POA: Diagnosis not present

## 2020-07-21 DIAGNOSIS — L89313 Pressure ulcer of right buttock, stage 3: Secondary | ICD-10-CM | POA: Diagnosis not present

## 2020-07-21 DIAGNOSIS — L89224 Pressure ulcer of left hip, stage 4: Secondary | ICD-10-CM | POA: Diagnosis not present

## 2020-07-21 DIAGNOSIS — L97511 Non-pressure chronic ulcer of other part of right foot limited to breakdown of skin: Secondary | ICD-10-CM | POA: Diagnosis not present

## 2020-07-21 DIAGNOSIS — L97311 Non-pressure chronic ulcer of right ankle limited to breakdown of skin: Secondary | ICD-10-CM | POA: Diagnosis not present

## 2020-07-21 DIAGNOSIS — L97521 Non-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: Secondary | ICD-10-CM | POA: Diagnosis not present

## 2020-07-21 DIAGNOSIS — Z89422 Acquired absence of other left toe(s): Secondary | ICD-10-CM | POA: Diagnosis not present

## 2020-07-21 DIAGNOSIS — I87332 Chronic venous hypertension (idiopathic) with ulcer and inflammation of left lower extremity: Secondary | ICD-10-CM | POA: Diagnosis not present

## 2020-07-21 DIAGNOSIS — L97312 Non-pressure chronic ulcer of right 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: Secondary | ICD-10-CM | POA: Diagnosis not present

## 2020-07-21 DIAGNOSIS — L97512 Non-pressure chronic ulcer of other part of right foot with fat layer exposed: Secondary | ICD-10-CM | POA: Diagnosis not present

## 2020-07-21 DIAGNOSIS — G8221 Paraplegia, complete: Secondary | ICD-10-CM | POA: Diagnosis not present

## 2020-07-21 DIAGNOSIS — S91001A Unspecified open wound, right ankle, initial encounter: Secondary | ICD-10-CM | POA: Diagnosis not present

## 2020-07-21 NOTE — Progress Notes (Signed)
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 wound although the dimensions are better. 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 Problematic areas on the plantar aspect of the first second toe web space on the left and the dorsal aspect of the first second toe web space on the right. At one point I felt these were probably related to chronic fungal infections in treated him aggressively for this although we have not made any improvement here. 08/11/17; left lateral leg. Surface continues to improve with the Iodoflex although we are not seeing much improvement in overall wound dimensions. Areas on his plantar left foot and right foot show no improvement. In fact the right foot looks somewhat worse 08/18/17; left lateral leg. We changed to [facility] Blue last week after [name] prolonged course of Iodoflex which helps get the surface better. It appears that the wound with is improved. Continue with difficult areas on the left dorsal first second and plantar first second on the right  09/01/17; patient arrives in clinic today having had [name] temperature of 103 yesterday. He was seen in the ER and [facility]. The patient was concerned he could have cellulitis again in the right leg however they diagnosed him with [name] UTI and he is now on Keflex. He has [name] history of cellulitis which is been recurrent and difficult but this is been in the left leg, in the past 5 use doxycycline. He does in and out catheterizations at home which are risk factors for UTI 09/08/17; patient will be completing his Keflex this weekend. The erythema on the left leg is considerably better. He has [name] new  wound today on the medial part of the right leg small superficial almost looks like [name] skin tear. He has worsening of the area on the right dorsal first and second toe. His major area on the left lateral leg is better. Using Hydrofera Blue on all areas 09/15/17; gradual reduction in width on the long wound in the left lateral leg. No debridement required. He also has wounds on the plantar aspect of his left first second toe web space and on the dorsal aspect of the right first second toe web space. 09/22/17; there continues to be very gradual improvements in the dimensions of the left lateral leg wound. He hasn't round erythematous spot with might be pressure on his wheelchair. There is no evidence obviously of infection no purulence no warmth He has [name] dry scaled area on the plantar aspect of the left first second toe Improved area on the dorsal right first second toe. 09/29/17; left lateral leg wound continues to improve in dimensions mostly with an is still [name] fairly long but increasingly narrow wound. He has [name] dry scaled area on the plantar aspect of his left first second toe web space Increasingly concerning area on the dorsal right first second toe. In fact I am concerned today about possible cellulitis around this wound. The areas extending up his second toe and although there is deformities here almost appears to abut on the nailbed. 10/06/17; left lateral leg wound continues to make very gradual progress. Tissue culture I did from the right first second toe dorsal foot last time grew MRSA and enterococcus which was vancomycin sensitive. This was not sensitive to clindamycin or doxycycline. He is allergic to Zyvox and sulfa we have therefore arrange for him to have dalvance infusion tomorrow. He is had this in the past and tolerated it well 10/20/17; left lateral leg wound continues to make decent progress. This is certainly reduced in terms of with there is advancing epithelialization.The  cellulitis in the right foot looks better although he still has [name] deep wound in the dorsal aspect of the first second toe web space. Plantar left first toe web space on the left I think is making some progress 10/27/17; left lateral leg wound continues to make decent progress. Advancing epithelialization.using Hydrofera Blue The right first second toe web space wound is better-looking using silver alginate Improvement in the left plantar first second toe web space. Again using silver alginate 11/03/17 left lateral leg wound continues to make decent progress albeit slowly. Using [facility] The right per second toe web space continues to be [name] very problematic looking punched out wound. I obtained [name] piece of tissue for deep culture I did extensively treated this for fungus. It is difficult to imagine that this is [name] pressure area as the patient states other than going outside he doesn't really wear shoes at home The  left plantar first second toe web space looked fairly senescent. Necrotic edges. This required debridement change to [facility] Blue to all wound areas 11/10/17; left lateral leg wound continues to contract. Using Hydrofera Blue On the right dorsal first second toe web space dorsally. Culture I did of this area last week grew MRSA there is not an easy oral option in this patient was multiple antibiotic allergies or intolerances. This was only [name] rare culture isolate I'[name] therefore going to use Bactroban under silver alginate On the left plantar first second toe web space. Debridement is required here. This is also unchanged 11/17/17; left lateral leg wound continues to contract using Hydrofera Blue this is no longer the major issue. The major concern here is the right first second toe web space. He now has an open area going from dorsally to the plantar aspect. There is now wound on the inner lateral part of the first toe. Not [name] very viable surface on this. There is erythema spreading  medially into the forefoot. No major change in the left first second toe plantar wound 11/24/17; left lateral leg wound continues to contract using Hydrofera Blue. Nice improvement today The right first second toe web space all of this looks [name] lot less angry than last week. I have given him clindamycin and topical Bactroban for MRSA and terbinafine for the possibility of underlining tinea pedis that I could not control with ketoconazole. Looks somewhat better The area on the plantar left first second toe web space is weeping with dried debris around the wound 12/01/17; left lateral leg wound continues to contract he Hydrofera Blue. It is becoming thinner in terms of with nevertheless it is making good improvement. The right first second toe web space looks less angry but still [name] large necrotic-looking wounds starting on the plantar aspect of the right foot extending between the toes and now extensively on the base of the right second toe. I gave him clindamycin and topical Bactroban for MRSA anterior benefiting for the possibility of underlying tinea pedis. Not looking better today The area on the left first/second toe looks better. Debrided of necrotic debris 12/05/17* the patient was worked in urgently today because over the weekend he found blood on his incontinence bad when he woke up. He was found to have an ulcer by his wife who does most of his wound care. He came in today for [place] to look at this. He has not had [name] history of wounds in his buttocks in spite of his paraplegia. 12/08/17; seen in follow-up today at his usual appointment. He was seen earlier this week and found to have [name] new wound on his buttock. We also follow him for wounds on the left lateral leg, left first second toe web space and right first second toe web space 12/15/17; we have been using Hydrofera Blue to the left lateral leg which has improved. The right first second toe web space has also improved. Left first second toe web  space plantar aspect looks stable. The left buttock has worsened using Santyl. Apparently the buttock has drainage 12/22/17; we have been using Hydrofera Blue to the left lateral leg which continues to improve now 2 small wounds separated by normal skin. He tells [place] he had [name] fever up to 100 yesterday he is prone to UTIs but has not noted anything different. He does in and out catheterizations. The area between the first and second toes today does not look good necrotic surface covered with what looks  to be purulent drainage and erythema extending into the third toe. I had gotten this to something that I thought look better last time however it is not look good today. He also has [name] necrotic surface over the buttock wound which is expanded. I thought there might be infection under here so I removed [name] lot of the surface with [name] #5 curet though nothing look like it really needed culturing. He is been using Santyl to this area 12/27/17; his original wound on the left lateral leg continues to improve using Hydrofera Blue. I gave him samples of Baxdella although he was unable to take them out of fear for an allergic reaction ["lump in his throat"].the culture I did of the purulent drainage from his second toe last week showed both enterococcus and [name] set Enterobacter I was also concerned about the erythema on the bottom of his foot although paradoxically although this looks somewhat better today. 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 We've been using silver alginate here. They came up today that he sleeps in [name] recliner, I'[name] not sure why but I asked him to stop this 01/03/18; his original wound we've been using Hydrofera Blue is now separated into 2 areas. Ulcer on his left buttock is better he is off the recliner and sleeping in bed Finally both wound areas between his first and second toes also looks some  better 01/10/18; his original wound on the left lateral leg is now separated into 2 wounds we've been using Hydrofera Blue Ulcer on his left buttock has some drainage. There is [name] small probing site going into muscle layer superiorly.using silver alginate -He arrives today with [name] deep tissue injury on the left heel The wound on the dorsal aspect of his first second toe on the left looks [name] lot betterusing silver alginate ketoconazole The area on the first second toe web space on the right also looks [name] lot bette 01/17/18; his original wound on the left lateral leg continues to progress using Hydrofera Blue Ulcer on his left buttock also is smaller surface healthier except for [name] small probing site going into the muscle layer superiorly. 2.4 cm of tunneling in this area DTI on his left heel we have only been offloading. Looks better than last week no threatened open no evidence of infection the wound on the dorsal aspect of the first second toe on the left continues to look like it's regressing we have only been using silver alginate and terbinafine orally The area in the first second toe web space on the right also looks to be [name] lot better using silver alginate and terbinafine I think this was prompted by tinea pedis 01/31/18; the patient was hospitalized in [place] last week apparently for [name] complicated UTI. He was discharged on cefepime he does in and out catheterizations. In the hospital he was discovered [name] I don't mild elevation of AST and ALT and the terbinafine was stopped.predictably the pressure ulcer on s his buttock looks betterusing silver alginate. The area on the left lateral leg also is better using Hydrofera Blue. The area between the first and second toes on the left better. First and second toes on the right still substantial but better. Finally the DTI on the left heel has held together and looks like it's resolving 02/07/18-he is here in follow-up evaluation for multiple ulcerations.  He has new injury to the lateral aspect of  the last issue [name] pressure ulcer, he states this is from adhesive removal trauma. He states he has tried multiple adhesive products with no success. All other ulcers appear stable. The left heel DTI is resolving. We will continue with same treatment plan and follow-up next week. 02/14/18; follow-up for multiple areas. He has [name] new area last week on the lateral aspect of his pressure ulcer more over the posterior trochanter. The original pressure ulcer looks quite stable has healthy granulation. 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 Almost fully epithelialized on the original superior area using Hydrofera Blue DTI on the left heel has peeled off this week to reveal [name] small superficial wound under denuded skin and subcutaneous tissue Both areas between the first and second toes look better including nothing open on the left 02/21/18; The patient's wounds on his left ischial tuberosity and posterior left greater trochanter actually looked better. He has [name] large area of irritation around the area which I think is contact dermatitis. I am doubtful that this is fungal His original wound on the left lateral calf continues to improve we have been using Hydrofera Blue There is no open area in the left first second toe web space although there is [name] lot of thick callus The DTI on the left heel required debridement today of necrotic surface eschar and subcutaneous tissue using silver alginate Finally the area on the right first second toe webspace continues to contract using silver alginate and ketoconazole 02/28/18 Left ischial tuberosity wounds look better using silver alginate. Original wound on the left calf only has one small open area left using Hydrofera Blue DTI on the left heel required debridement mostly removing skin from around this wound surface. Using  silver alginate The areas on the right first/second toe web space using silver alginate and ketoconazole 03/08/18 on evaluation today patient appears to be doing decently well as best I can tell in regard to his wounds. This is the first time that I have seen him as he generally is followed by Dr. Dellia Nims. With that being said none of his wounds appear to be infected he does have an area where there is some skin covering what appears to be [name] new wound on the left dorsal surface of his great toe. This is right at the nail bed. With that being said I do believe that debrided away some of the excess skin can be of benefit in this regard. Otherwise he has been tolerating the dressing changes without complication. 03/14/18; patient arrives today with the multiplicity of wounds that we are following. He has not been systemically unwell Original wound on the left lateral calf now only has 2 small open areas we've been using Hydrofera Blue which should continue The deep tissue injury on the left heel requires debridement today. We've been using silver alginate The left first second toe and the right first second toe are both are reminiscence what I think was tinea pedis. Apparently some of the callus Surface between the toes was removed last week when it started draining. Purulent drainage coming from the wound on the ischial tuberosity on the left. 03/21/18-He is here in follow-up evaluation for multiple wounds. 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 He tolerated debridement. The only change to last week's recommendations is to discontinue antifungal cream between toes. He will follow-up next week  03/28/18; following up for multiple wounds;Concern this week is streaking redness and swelling in the right foot. He is going to need antibiotics for this. 03/31/18; follow-up for right foot cellulitis. Streaking redness and swelling in the  right foot on 03/28/18. 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 I put him on clindamycin 300 mg every 6 and brought him in for [name] quick check. He has an open wound between his first and second toes on the right foot as [name] potential source. 04/04/18; Right foot cellulitis is resolving he is completing clindamycin. This is truly good news Left lateral calf wound which is initial wound only has one small open area inferiorly this is close to healing out. He has compression stockings. We will use Hydrofera Blue right down to the epithelialization of this Nonviable surface on the left heel which was initially pressure with [name] DTI. We've been using Hydrofera Blue. I'[name] going to switch this back to silver alginate Left first second toe/tinea pedis this looks better using silver alginate Right first second toe tinea pedis using silver alginate Large pressure ulcers on theLeft ischial tuberosity. Small wound here Looks better. I am uncertain about the surface over the large wound. Using silver alginate 04/11/18; Cellulitis in the right foot is resolved Left lateral calf wound which was his original wounds still has 2 tiny open areas remaining this is just about closed Nonviable surface on the left heel is better but still requires debridement Left first second toe/tinea pedis still open using silver alginate Right first second toe wound tinea pedis I asked him to go back to using ketoconazole and silver alginate Large pressure ulcers on the left ischial tuberosity this shear injury here is resolved. Wound is smaller. No evidence of infection using silver alginate 04/18/18; Patient arrives with an intense area of cellulitis in the right mid lower calf extending into the right heel area. Bright red and warm. Smaller area on the left anterior leg. He has [name] significant history of MRSA. He will definitely need antibioticsdoxycycline He now has 2 open areas on the left ischial tuberosity the  original large wound and now [name] satellite area which I think was above his initial satellite areas. Not [name] wonderful surface on this satellite area surrounding erythema which looks like pressure related. His left lateral calf wound again his original wound is just about closed Left heel pressure injury still requiring debridement Left first second toe looks [name] lot better using silver alginate Right first second toe also using silver alginate and ketoconazole cream also looks better 04/20/18; the patient was worked in early today out of concerns with his cellulitis on the right leg. I had started him on doxycycline. This was 2 days ago. His wife was concerned about the swelling in the area. Also concerned about the left buttock. He has not been systemically unwell no fever chills. No nausea vomiting or diarrhea 04/25/18; the patient's left buttock wound is continued to deteriorate he is using Hydrofera Blue. He is still completing clindamycin for the cellulitis on the right leg although all of this looks better. 05/02/18 Left buttock wound still with [name] lot of drainage and [name] very tightly adherent fibrinous necrotic surface. He has [name] deeper area superiorly The left lateral calf wound is still closed DTI wound on the left heel necrotic surface especially the circumference using Iodoflex Areas between his left first second toe and right first second toe both look better. Dorsally and the right first second toe he  had [name] necrotic surface although at smaller. In using silver alginate and ketoconazole. I did [name] culture last week which was [name] deep tissue culture of the reminiscence of the open wound on the right first second toe dorsally. This grew [name] few Acinetobacter and [name] few methicillin-resistant staph aureus. Nevertheless the area actually this week looked better. I didn't feel the need to specifically address this at least in terms of systemic antibiotics. 05/09/18; wounds are measuring larger more drainage per  our intake. We are using Santyl covered with alginate on the large superficial buttock wounds, Iodosorb on the left heel, ketoconazole and silver alginate to the dorsal first and second toes bilaterally. 05/16/18; The area on his left buttock better in some aspects although the area superiorly over the ischial tuberosity required an extensive debridement.using Santyl Left heel appears stable. Using Iodoflex The areas between his first and second toes are not bad however there is spreading erythema up the dorsal aspect of his left foot this looks like cellulitis again. He is insensate the erythema is really very brilliant.o Erysipelas He went to see an allergist days ago because he was itching part of this he had lab work done. This showed [name] white count of 15.1 with 70% neutrophils. Hemoglobin of 11.4 and [name] platelet count of 659,000. Last white count we had in Epic was [name] 2-1/2 years ago which was 25.9 but he was ill at the time. He was able to show me some lab work that was done by his primary physician the pattern is about the same. 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 But prompted me to go ahead and do x-rays of both feet and the pelvis rule out osteomyelitis. He also had [name] comprehensive metabolic panel this was reasonably normal his albumin was 3.7 liver function tests BUN/creatinine all normal 05/23/18; x-rays of both his feet from last week were negative for underlying pulmonary abnormality. The x-ray of his pelvis however showed mild irregularity in the left ischial which may represent some early osteomyelitis. The wound in the left ischial continues to get deeper clearly now exposed muscle. Each week necrotic surface material over this area. Whereas the rest of the wounds do not look so bad. The left ischial wound we have been using Santyl and calcium alginate T the left heel surface necrotic debris using Iodoflex o The left lateral leg is still  healed Areas on the left dorsal foot and the right dorsal foot are about the same. There is some inflammation on the left which might represent contact dermatitis, fungal dermatitis I am doubtful cellulitis although this looks better than last week 05/30/18; CT scan done at Hospital did not show any osteomyelitis or abscess. Suggested the possibility of underlying cellulitis although I don't see [name] lot of evidence of this at the bedside The wound itself on the left buttock/upper thigh actually looks somewhat better. No debridement Left heel also looks better no debridement continue Iodoflex Both dorsal first second toe spaces appear better using Lotrisone. Left still required debridement 06/06/18; Intake reported some purulent looking drainage from the left gluteal wound. Using Santyl and calcium alginate Left heel looks better although still [name] nonviable surface requiring debridement The left dorsal foot first/second webspace actually expanding and somewhat deeper. I may consider doing [name] shave biopsy of this area Right dorsal foot first/second webspace appears stable to improved. Using Lotrisone and silver alginate to both these areas 06/13/18 Left gluteal surface looks  better. Now separated in the 2 wounds. No debridement required. Still drainage. We'll continue silver alginate Left heel continues to look better with Iodoflex continue this for at least another week Of his dorsal foot wounds the area on the left still has some depth although it looks better than last week. We've been using Lotrisone and silver alginate 06/20/18 Left gluteal continues to look better healthy tissue Left heel continues to look better healthy granulation wound is smaller. He is using Iodoflex and his long as this continues continue the Iodoflex Dorsal right foot looks better unfortunately dorsal left foot does not. There is swelling and erythema of his forefoot. He had minor trauma to this several days ago but doesn't  think this was enough to have caused any tissue injury. Foot looks like cellulitis, we have had this problem before 06/27/18 on evaluation today patient appears to be doing [name] little worse in regard to his foot ulcer. 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 Both of which would really be his only options for treating this infection. In the past he has been given and effusion of Orbactiv. This is done very well for him in the past again it's one time dosing IV antibiotic therapy. Subsequently I do believe this is something we're gonna need to see about doing at this point in time. Currently his other wounds seem to be doing somewhat better in my pinion I'[name] pretty happy in that regard. 07/03/18 on evaluation today patient's wounds actually appear to be doing fairly well. He has been tolerating the dressing changes without complication. All in all he seems to be showing signs of improvement. In regard to the antibiotics he has been dealing with infectious disease since I saw him last week as far as getting this scheduled. In the end he's going to be [facility] this coming Friday. In the meantime he has been continuing to perform the dressing changes in such as previous. There does not appear to be any evidence of infection worsengin at this time. 07/10/18; Since I last saw this man 2 [name] ago things have actually improved. IV antibiotics of resulted in less forefoot erythema although there is still some present. He is not systemically unwell Left buttock wounds 2 now have no depth there is increased epithelialization Using silver alginate Left heel still requires debridement using Iodoflex Left dorsal foot still with [name] sizable wound about the size of [name] border but healthy granulation Right dorsal foot still with [name]  slitlike area using silver alginate 07/18/18; the patient's cellulitis in the left foot is improved in fact I think it is on its way to resolving. Left buttock wounds 2 both look better although the larger one has hypertension granulation we've been using silver alginate Left heel has some thick circumferential redundant skin over the wound edge which will need to be removed today we've been using Iodoflex Left dorsal foot is still [name] sizable wound required debridement using silver alginate The right dorsal foot is just about closed only [name] small open area remains here 07/25/18; left foot cellulitis is resolved Left buttock wounds 2 both look better. Hyper-granulation on the major area Left heel as some debris over the surface but otherwise looks [name] healthier wound. Using silver collagen Right dorsal foot is just about closed 07/31/18; arrives with  our intake nurse worried about purulent drainage from the buttock. We had hyper-granulation here last week His buttock wounds 2 continue to look better Left heel some debris over the surface but measuring smaller. Right dorsal foot unfortunately has openings between the toes Left foot superficial wound looks less aggravated. 08/07/18 Buttock wounds continue to look better although some of her granulation and the larger medial wound. silver alginate Left heel continues to look [name] lot better.silver collagen Left foot superficial wound looks less stable. Requires debridement. He has [name] new wound superficial area on the foot on the lateral dorsal foot. Right foot looks better using silver alginate without Lotrisone 08/14/2018; patient was in the ER last week diagnosed with [name] UTI. He is now on Cefpodoxime and Macrodantin. Buttock wounds continued to be smaller. Using silver alginate Left heel continues to look better using silver collagen Left foot superficial wound looks as though it is improving Right dorsal foot area is just about healed. 08/21/2018; patient is  completed his antibiotics for his UTI. He has 2 open areas on the buttocks. There is still not closed although the surface looks satisfactory. Using silver alginate Left heel continues to improve using silver collagen The bilateral dorsal foot areas which are at the base of his first and second toes/possible tinea pedis are actually stable on the left but worse on the right. The area on the left required debridement of necrotic surface. After debridement I obtained [name] specimen for PCR culture. The right dorsal foot which is been just about healed last week is now reopened 08/28/2018; culture done on the left dorsal foot showed coag negative staph both staph epidermidis and Lugdunensis. I think this is worthwhile initiating systemic treatment. I will use doxycycline given his long list of allergies. The area on the left heel slightly improved but still requiring debridement. The large wound on the buttock is just about closed whereas the smaller one is larger. Using silver alginate in this area 09/04/2018; patient is completing his doxycycline for the left foot although this continues to be [name] very difficult wound area with very adherent necrotic debris. We are using silver alginate to all his wounds right foot left foot and the small wounds on his buttock, silver collagen on the left heel. 09/11/2018; once again this patient has intense erythema and swelling of the left forefoot. Lesser degrees of erythema in the right foot. He has [name] long list of allergies and intolerances. I will reinstitute doxycycline. 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 Using silver alginate Left heel also looks better using silver collagen Unfortunately both the areas on his feet look worse. The area on the left first second webspace is now gone through to the plantar part of his foot. The area on the left foot anteriorly is irritated with erythema and swelling in the  forefoot. 09/25/2018 His wound on the left plantar heel looks better. Using silver collagen The area on the left buttock 2 small remnant areas. One is closed one is still open. Using silver alginate The areas between both his first and second toes look worse. This in spite of long-standing antifungal therapy with ketoconazole and silver alginate which should have antifungal activity He has small areas around his original wound on the left calf one is on the bottom of the original scar tissue and one superiorly both of these are small and superficial but again given wound history in this site this is  worrisome 10/02/2018 Left plantar heel continues to gradually contract using silver collagen Left buttock wound is unchanged using silver alginate The areas on his dorsal feet between his first and second toes bilaterally look about the same. I prescribed clindamycin ointment to see if we can address chronic staph colonization and also the underlying possibility of erythrasma The left lateral lower extremity wound is actually on the lateral part of his ankle. Small open area here. We have been using silver alginate 10/09/2018; Left plantar heel continues to look healthy and contract. No debridement is required Left buttock slightly smaller with [name] tape injury wound just below which was new this week Dorsal feet somewhat improved I have been using clindamycin Left lateral looks lower extremity the actual open area looks worse although [name] lot of this is epithelialized. I am going to change to silver collagen today He has [name] lot more swelling in the right leg although this is not pitting not red and not particularly warm there is [name] lot of spasm in the right leg usually indicative of people with paralysis of some underlying discomfort. We have reviewed his vascular status from 2017 he had [name] left greater saphenous vein ablation. I wonder about referring him back to vascular surgery if the area on the left  leg continues to deteriorate. 10/16/2018 in today for follow-up and management of multiple lower extremity ulcers. His left Buttock wound is much lower smaller and almost closed completely. The wound to the left ankle has began to reopen with Epithelialization and some adherent slough. He has multiple new areas to the left foot and leg. The left dorsal foot without much improvement. Wound present between left great webspace and 2nd toe. Erythema and edema present right leg. Right LE ultrasound obtained on 10/10/18 was negative for DVT . 10/23/2018; Left buttock is closed over. Still dry macerated skin but there is no open wound. I suspect this is chronic pressure/moisture Left lateral calf is quite [name] bit worse than when I saw this last. There is clearly drainage here he has macerated skin into the left plantar heel. We will change the primary dressing to alginate Left dorsal foot has some improvement in overall wound area. Still using clindamycin and silver alginate Right dorsal foot about the same as the left using clindamycin and silver alginate The erythema in the right leg has resolved. He is DVT rule out was negative Left heel pressure area required debridement although the wound is smaller and the surface is health 10/26/2018 The patient came back in for his nurse check today predominantly because of the drainage coming out of the left lateral leg with [name] recent reopening of his original wound on the left lateral calf. He comes in today with [name] large amount of surrounding erythema around the wound extending from the calf into the ankle and even in the area on the dorsal foot. He is not systemically unwell. He is not febrile. Nevertheless this looks like cellulitis. We have been using silver alginate to the area. I changed him to [name] regular visit and I am going to prescribe him doxycycline. The rationale here is [name] long list of medication intolerances and [name] history of MRSA. I did not see anything  that I thought would provide [name] valuable culture 10/30/2018 Follow-up from his appointment 4 days ago with really an extensive area of cellulitis in the left calf left lateral ankle and left dorsal foot. I put him on doxycycline. He has [name] long list of medication allergies  which are true allergy reactions. 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 In any case he is [name] lot better today. The erythema especially in the anterior and lateral left calf is better. He still has left ankle erythema. He also is complaining about increasing edema in the right leg we have only been using Kerlix Coban and he has been doing the wraps at home. Finally he has [name] spotty rash on the medial part of his upper left calf which looks like folliculitis or perhaps wrap occlusion type injury. Small superficial macules not pustules 11/06/18 patient arrives today with again [name] considerable degree of erythema around the wound on the left lateral calf extending into the dorsal ankle and dorsal foot. This is [name] lot worse than when I saw this last week. He is on doxycycline really with not [name] lot of improvement. He has not been systemically unwell Wounds on the; left heel actually looks improved. Original area on the left foot and proximity to the first and second toes looks about the same. He has superficial areas on the dorsal foot, anterior calf and then the reopening of his original wound on the left lateral calf which looks about the same The only area he has on the right is the dorsal webspace first and second which is smaller. He has [name] large area of dry erythematous skin on the left buttock small open area here. 11/13/2018; the patient arrives in much better condition. The erythema around the wound on the left lateral calf is [name] lot better. 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 In any case this is [name] lot better. The area on the left heel is very small and just about resolved using silver collagen we have been using silver alginate to the areas on his dorsal feet 11/20/2018; his wounds include the left lateral calf, left heel, dorsal aspects of both feet just proximal to the first second webspace. He is stable to slightly improved. I did not think any changes to his dressings were going to be necessary 11/27/2018 he has [name] reopening on the left buttock which is surrounded by what looks like tinea or perhaps some other form of dermatitis. The area on the left dorsal foot has some erythema around it I have marked this area but I am not sure whether this is cellulitis or not. Left heel is not closed. Left calf the reopening is really slightly longer and probably worse 1/13; in general things look better and smaller except for the left dorsal foot. Area on the left heel is just about closed, left buttock looks better only [name] small wound remains in the skin looks better [using Lotrisone] 1/20; the area on the left heel only has [name] few remaining open areas here. Left lateral calf about the same in terms of size, left dorsal foot slightly larger right lateral foot still not closed. The area on the left buttock has no open wound and the surrounding skin looks [name] lot better 1/27; the area on the left heel is closed. Left lateral calf better but still requiring extensive debridements. The area on his left buttock is closed. He still has the open areas on the left dorsal foot which is slightly smaller in the right foot which is slightly expanded. We have been using Iodoflex on these areas as well 2/3; left heel is  closed. Left lateral calf still requiring debridement using Iodoflex there is no open area on his left buttock however he has dry scaly skin over [name] large area of this. Not really responding well to the Lotrisone. Finally the areas on his dorsal feet at the level of the  first second webspace are slightly smaller on the right and about the same on the left. Both of these vigorously debrided with Anasept and gauze 2/10; left heel remains closed he has dry erythematous skin over the left buttock but there is no open wound here. Left lateral leg has come in and with. Still requiring debridement we have been using Iodoflex here. Finally the area on the left dorsal foot and right dorsal foot are really about the same extremely dry callused fissured areas. He does not yet have [name] dermatology appointment 2/17; left heel remains closed. He has [name] new open area on the left buttock. The area on the left lateral calf is bigger longer and still covered in necrotic debris. No major change in his foot areas bilaterally. I am awaiting for [name] dermatologist to look on this. We have been using ketoconazole I do not know that this is been doing any good at all. 2/24; left heel remains closed. The left buttock wound that was new reopening last week looks better. The left lateral calf appears better also although still requires debridement. The major area on his foot is the left first second also requiring debridement. We have been putting Prisma on all wounds. I do not believe that the ketoconazole has done too much good for his feet. He will use Lotrisone I am going to give him [name] 2-week course of terbinafine. We still do not have [name] dermatology appointment 3/2 left heel remains closed however there is skin over bone in this area I pointed this out to him today. The left buttock wound is epithelialized but still does not look completely stable. The area on the left leg required debridement were using silver collagen here. With regards to his feet we changed to Lotrisone last week and silver alginate. 3/9; left heel remains closed. Left buttock remains closed. The area on the right foot is essentially closed. The left foot remains unchanged. Slightly smaller on the left lateral calf. Using  silver collagen to both of these areas 3/16-Left heel remains closed. Area on right foot is closed. Left lateral calf above the lateral malleolus open wound requiring debridement with easy bleeding. Left dorsal wound proximal to first toe also debrided. Left ischial area open new. Patient has been using Prisma with wrapping every 3 days. Dermatology appointment is apparently tomorrow.Patient has completed his terbinafine 2-week course with some apparent improvement according to him, there is still flaking and dry skin in his foot on the left 3/23; area on the right foot is reopened. The area on the left anterior foot is about the same still [name] very necrotic adherent surface. He still has the area on the left leg and reopening is on the left buttock. He apparently saw dermatology although I do not have [name] note. According to the patient who is usually fairly well informed they did not have any good ideas. Put him on oral terbinafine which she is been on before. 3/30; using silver collagen to all wounds. Apparently his dermatologist put him on doxycycline and rifampin presumably some culture grew staph. I do not have this result. He remains on terbinafine although I have used terbinafine on him before 4/6; patient has had  [name] fairly substantial reopening on the right foot between the first and second toes. He is finished his terbinafine and I believe is on doxycycline and rifampin still as prescribed by dermatology. We have been using silver collagen to all his wounds although the patient reports that he thinks silver alginate does better on the wounds on his buttock. 4/13; the area on his left lateral calf about the same size but it did not require debridement. Left dorsal foot just proximal to the webspace between the first and second toes is about the same. Still nonviable surface. I note some superficial bronze discoloration of the dorsal part of his foot Right dorsal foot just proximal to the first  and second toes also looks about the same. I still think there may be the same discoloration I noted above on the left Left buttock wound looks about the same 4/20; left lateral calf appears to be gradually contracting using silver collagen. 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 The left dorsal foot wound is debrided of tightly adherent necrotic debris and really cleans up quite nicely. The right area is worse with expansion. I did not debride this it is now over the base of the second toe The area on his left buttock is smaller no debridement is required using silver collagen 5/4; left calf continues to make good progress. He arrives with erythema around the wounds on his dorsal foot which even extends to the plantar aspect. Very concerning for coexistent infection. He is finished the erythromycin I gave him for possible erythrasma this does not seem to have helped. The area on the left foot is about the same base of the dorsal toes Is area on the buttock looks improved on the left 5/11; left calf and left buttock continued to make good progress. Left foot is about the same to slightly improved. Major problem is on the right foot. He has not had an x-ray. Deep tissue culture I did last week showed both Enterobacter and E. coli. I did not change the doxycycline I put him on empirically although neither 1 of these were plated to doxycycline. He arrives today with the erythema looking worse on both the dorsal and plantar foot. Macerated skin on the bottom of the foot. he has not been systemically unwell 5/18-Patient returns at 1 week, left calf wound appears to be making some progress, left buttock wound appears slightly worse than last time, left foot wound looks slightly better, right foot redness is marginally better. X-ray of both feet show no air or evidence of osteomyelitis. Patient is finished his Omnicef and terbinafine. He  continues to have macerated skin on the bottom of the left foot as well as right 5/26; left calf wound is better, left buttock wound appears to have multiple small superficial open areas with surrounding macerated skin. X-rays that I did last time showed no evidence of osteomyelitis in either foot. He is finished cefdinir and doxycycline. I do not think that he was on terbinafine. He continues to have [name] large superficial open area on the right foot anterior dorsal and slightly between the first and second toes. I did send him to dermatology 2 months ago or so wondering about whether they would do [name] fungal scraping. I do not believe they did but did do [name] culture. We have been using silver alginate to the toe areas, he has been using antifungals at home topically either ketoconazole or Lotrisone. We are using silver  collagen on the left foot, silver alginate on the right, silver collagen on the left lateral leg and silver alginate on the left buttock 6/1; left buttock area is healed. We have the left dorsal foot, left lateral leg and right dorsal foot. We are using silver alginate to the areas on both feet and silver collagen to the area on his left lateral calf 6/8; the left buttock apparently reopened late last week. He is not really sure how this happened. He is tolerating the terbinafine. Using silver alginate to all wounds 6/15; left buttock wound is larger than last week but still superficial. Came in the clinic today with [name] report of purulence from the left lateral leg I did not identify any infection Both areas on his dorsal feet appear to be better. He is tolerating the terbinafine. Using silver alginate to all wounds 6/22; left buttock is about the same this week, left calf quite [name] bit better. His left foot is about the same however he comes in with erythema and warmth in the right forefoot once again. Culture that I gave him in the beginning of May showed Enterobacter and E. coli. I gave him  doxycycline and things seem to improve although neither 1 of these organisms was specifically plated. 6/29; left buttock is larger and dry this week. Left lateral calf looks to me to be improved. Left dorsal foot also somewhat improved right foot completely unchanged. The erythema on the right foot is still present. He is completing the Ceftin dinner that I gave him empirically [see discussion above.) 7/6 - All wounds look to be stable and perhaps improved, the left buttock wound is slightly smaller, per patient bleeds easily, completed ceftin, the right foot redness is less, he is on terbinafine 7/13; left buttock wound about the same perhaps slightly narrower. Area on the left lateral leg continues to narrow. Left dorsal foot slightly smaller right foot about the same. We are using silver alginate on the right foot and Hydrofera Blue to the areas on the left. Unna boot on the left 2 layer compression on the right 7/20; left buttock wound absolutely the same. Area on lateral leg continues to get better. Left dorsal foot require debridement as did the right no major change in the 7/27; left buttock wound the same size necrotic debris over the surface. The area on the lateral leg is closed once again. His left foot looks better right foot about the same although there is some involvement now of the posterior first second toe area. He is still on terbinafine which I have given him for [name] month, not certain [name] centimeter major change 06/25/19-All wounds appear to be slightly improved according to report, left buttock wound looks clean, both foot wounds have minimal to no debris the right dorsal foot has minimal slough. We are using Hydrofera Blue to the left and silver alginate to the right foot and ischial wound. 8/10-Wounds all appear to be around the same, the right forefoot distal part has some redness which was not there before, however the wound looks clean and small. Ischial wound looks about the  same with no changes 8/17; his wound on the left lateral calf which was his original chronic venous insufficiency wound remains closed. Since I last saw him the areas on the left dorsal foot right dorsal foot generally appear better but require debridement. The area on his left initial tuberosity appears somewhat larger to me perhaps hyper granulated and bleeds very easily. We have been  using Hydrofera Blue to the left dorsal foot and silver alginate to everything else 8/24; left lateral calf remains closed. The areas on his dorsal feet on the webspace of the first and second toes bilaterally both look better. The area on the left buttock which is the pressure ulcer stage II slightly smaller. I change the dressing to Hydrofera Blue to all areas 8/31; left lateral calf remains closed. The area on his dorsal feet bilaterally look better. Using Hydrofera Blue. Still requiring debridement on the left foot. No change in the left buttock pressure ulcers however 9/14; left lateral calf remains closed. Dorsal feet look quite [name] bit better than 2 [name] ago. Flaking dry skin also [name] lot better with the ammonium lactate I gave him 2 [name] ago. The area on the left buttock is improved. He states that his Roho cushion developed [name] leak and he is getting [name] new one, in the interim he is offloading this vigorously 9/21; left calf remains closed. Left heel which was [name] possible DTI looks better this week. He had macerated tissue around the left dorsal foot right foot looks satisfactory and improved left buttock wound. I changed his dressings to his feet to silver alginate bilaterally. Continuing Hydrofera Blue on the left buttock. 9/28 left calf remains closed. Left heel did not develop anything [possible DTI] dry flaking skin on the left dorsal foot. Right foot looks satisfactory. Improved left buttock wound. We are using silver alginate on his feet Hydrofera Blue on the buttock. I have asked him to go back to the  Lotrisone on his feet including the wounds and surrounding areas 10/5; left calf remains closed. The areas on the left and right feet about the same. [name] lot of this is epithelialized however debris over the remaining open areas. He is using Lotrisone and silver alginate. The area on the left buttock using Hydrofera Blue 10/26. Patient has been out for 3 [name] secondary to Covid concerns. He tested negative but I think his wife tested positive. He comes in today with the left foot substantially worse, right foot about the same. Even more concerning he states that the area on his left buttock closed over but then reopened and is considerably deeper in one aspect than it was before [stage III wound] 11/2; left foot really about the same as last week. Quarter sized wound on the dorsal foot just proximal to the first second toes. Surrounding erythema with areas of denuded epithelium. This is not really much different looking. Did not look like cellulitis this time however. Right foot area about the same.. We have been using silver alginate alginate on his toes Left buttock still substantial irritated skin around the wound which I think looks somewhat better. We have been using Hydrofera Blue here. 11/9; left foot larger than last week and [name] very necrotic surface. Right foot I think is about the same perhaps slightly smaller. Debris around the circumference also addressed. Unfortunately on the left buttock there is been [name] decline. Satellite lesions below the major wound distally and now [name] an additional one posteriorly we have been using Hydrofera Blue but I think this is [name] pressure issue 11/16; left foot ulcer dorsally again [name] very adherent necrotic surface. Right foot is about the same. Not much change in the pressure ulcer on his left buttock. 11/30; left foot ulcer dorsally basically the same as when I saw him 2 [name] ago. Very adherent fibrinous debris on the wound surface. Patient reports [name] lot  of  drainage as well. 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 We have been using Iodoflex, patient changed back to alginate because of the drainage. Area on his right dorsal foot really looks benign with [name] healthier surface certainly [name] lot better than on the left. Left buttock wounds all improved using Hydrofera Blue 12/7; left dorsal foot again no improvement. Tightly adherent debris. PCR culture I did last week only showed likely skin contaminant. I have gone ahead and done [name] punch biopsy of this which is about the last thing in terms of investigations I can think to do. He has known venous insufficiency and venous hypertension and this could be the issue here. The area on the right foot is about the same left buttock slightly worse according to our intake nurse secondary to [facility] Blue sticking to the wound 12/14; biopsy of the left foot that I did last time showed changes that could be related to wound healing/chronic stasis dermatitis phenomenon no neoplasm. We have been using silver alginate to both feet. I change the one on the left today to Sorbact and silver alginate to his other 2 wounds 12/28; the patient arrives with the following problems; Major issue is the dorsal left foot which continues to be [name] larger deeper wound area. Still with [name] completely nonviable surface Paradoxically the area mirror image on the right on the right dorsal foot appears to be getting better. He had some loss of dry denuded skin from the lower part of his original wound on the left lateral calf. Some of this area looked [name] little vulnerable and for this reason we put him in wrap that on this side this week The area on his left buttock is larger. He still has the erythematous circular area which I think is [name] combination of pressure, sweat. This does not look like cellulitis or fungal dermatitis 11/26/2019; -Dorsal left foot large open wound with depth. Still  debris over the surface. Using Sorbact The area on the dorsal right foot paradoxically has closed over He has [name] reopening on the left ankle laterally at the base of his original wound that extended up into the calf. This appears clean. The left buttock wound is smaller but with very adherent necrotic debris over the surface. We have been using silver alginate here as well The patient had arterial studies done in 2017. He had biphasic waveforms at the dorsalis pedis and posterior tibial bilaterally. ABI in the left was 1.17. Digit waveforms were dampened. He has slight spasticity in the great toes I do not think [name] TBI would be possible 1/11; the patient comes in today with [name] sizable reopening between the first and second toes on the right. This is not exactly in the same location where we have been treating wounds previously. According to our intake nurse this was actually fairly deep but 0.6 cm. The area on the left dorsal foot looks about the same the surface is somewhat cleaner using Sorbact, his MRI is in 2 days. We have not managed yet to get arterial studies. The new reopening on the left lateral calf looks somewhat better using alginate. The left buttock wound is about the same using alginate 1/18; the patient had his ARTERIAL studies which were quite normal. ABI in the right at 1.13 with triphasic/biphasic waveforms on the left ABI 1.06 again with triphasic/biphasic waveforms. It would not have been possible to have done [name] toe brachial index because of spasticity. We have  been using Sorbac to the left foot alginate to the rest of his wounds on the right foot left lateral calf and left buttock 1/25; arrives in clinic with erythema and swelling of the left forefoot worse over the first MTP area. This extends laterally dorsally and but also posteriorly. Still has an area on the left lateral part of the lower part of his calf wound it is eschared and clearly not closed. Area on the left buttock  still with surrounding irritation and erythema. Right foot surface wound dorsally. The area between the right and first and second toes appears better. 2/1; The left foot wound is about the same. Erythema slightly better I gave him [name] week of doxycycline empirically Right foot wound is more extensive extending between the toes to the plantar surface Left lateral calf really no open surface on the inferior part of his original wound however the entire area still looks vulnerable Absolutely no improvement in the left buttock wound required debridement. 2/8; the left foot is about the same. Erythema is slightly improved I gave him clindamycin last week. Right foot looks better he is using Lotrimin and silver alginate He has [name] breakdown in the left lateral calf. Denuded epithelium which I have removed Left buttock about the same were using Hydrofera Blue 2/15; left foot is about the same there is less surrounding erythema. Surface still has tightly adherent debris which I have debriding however not making any progress Right foot has [name] substantial wound on the medial right second toe between the first and second webspace. Still an open area on the left lateral calf distal area. Buttock wound is about the same 2/22; left foot is about the same less surrounding erythema. Surface has adherent debris. Polymen Ag Right foot area significant wound between the first and second toes. We have been using silver alginate here Left lateral leg polymen Ag at the base of his original venous insufficiency wound Left buttock some improvement here 3/1; Right foot is deteriorating in the first second toe webspace. Larger and more substantial. We have been using silver alginate. Left dorsal foot about the same markedly adherent surface debris using PolyMem Ag Left lateral calf surface debris using PolyMem AG Left buttock is improved again using PolyMem Ag. He is completing his terbinafine. The erythema in the foot  seems better. He has been on this for 2 [name] 3/8; no improvement in any wound area in fact he has [name] small open area on the dorsal midfoot which is new this week. He has not gotten his foot x-rays yet 3/15; his x-rays were both negative for osteomyelitis of both feet. No major change in any of his wounds on the extremities however his buttock wounds are better. We have been using polymen on the buttocks, left lower leg. Iodoflex on the left foot and silver alginate on the right 3/22; arrives in clinic today with the 2 major issues are the improvement in the left dorsal foot wound which for once actually looks healthy with [name] nice healthy wound surface without debridement. Using Iodoflex here. Unfortunately on the left lateral calf which is in the distal part of his original wound he came to the clinic here for there was purulent drainage noted some increased breakdown scattered around the original area and [name] small area proximally. We we are using polymen here will change to silver alginate today. His buttock wound on the left is better and I think the area on the right first second toe webspace is  also improved 3/29; left dorsal foot looks better. Using Iodoflex. Left ankle culture from deterioration last time grew E. coli, Enterobacter and Enterococcus. I will give him [name] course of cefdinir although that will not cover Enterococcus. The area on the right foot in the webspace of the first and second toe lateral first toe looks better. The area on his buttock is about healed Vascular appointment is on April 21. This is to look at his venous system vis--vis continued breakdown of the wounds on the left including the left lateral leg and left dorsal foot he. He has had previous ablations on this side 4/5; the area between the right first and second toes lateral aspect of the first toe looks better. Dorsal aspect of the left first toe on the left foot also improved. Unfortunately the left lateral lower leg  is larger and there is [name] second satellite wound superiorly. The usual superficial abrasions on the left buttock overall better but certainly not closed 4/12; the area between the right first and second toes is improved. Dorsal aspect of the left foot also slightly smaller with [name] vibrant healthy looking surface. No real change in the left lateral leg and the left buttock wound is healed He has an unaffordable co-pay for Apligraf. Appointment with vein and vascular with regards to the left leg venous part of the circulation is on 4/21 4/19; we continue to see improvement in all wound areas. Although this is minor. He has his vascular appointment on 4/21. The area on the left buttock has not reopened although right in the center of this area the skin looks somewhat threatened 4/26; the left buttock is unfortunately reopened. In general his left dorsal foot has [name] healthy surface and looks somewhat smaller although it was not measured as such. The area between his first and second toe webspace on the right as [name] small wound against the first toe. The patient saw vascular surgery. The real question I was asking was about the small saphenous vein on the left. He has previously ablated left greater saphenous vein. Nothing further was commented on on the left. Right greater saphenous vein without reflux at the saphenofemoral junction or proximal thigh there was no indication for ablation of the right greater saphenous vein duplex was negative for DVT bilaterally. They did not think there was anything from [name] vascular surgery point of view that could be offered. They ABIs within normal limits 5/3; only small open area on the left buttock. The area on the left lateral leg which was his original venous reflux is now 2 wounds both which look clean. We are using Iodoflex on the left dorsal foot which looks healthy and smaller. He is down to [name] very tiny area between the right first and second toes, using  silver alginate 5/10; all of his wounds appear better. We have much better edema control in 4 layer compression on the left. This may be the factor that is allowing the left foot and left lateral calf to heal. He has external compression garments at home 04/14/20-All of his wounds are progressing well, the left forefoot is practically closed, left ischium appears to be about the same, right toe webspace is also smaller. The left lateral leg is about the same, continue using Hydrofera Blue to this, silver alginate to the ischium, Iodoflex to the toe space on the right 6/7; most of his wounds outside of the left buttock are doing well. The area on the left lateral calf and left  dorsal foot are smaller. The area on the right foot in between the first and second toe webspace is barely visible although he still says there is some drainage here is the only reason I did not heal this out. Unfortunately the area on the left buttock almost looks like he has [name] skin tear from tape. He has open wound and then [name] large flap of skin that we are trying to get adherence over an area just next to the remaining wound 6/21; 2 week follow-up. I believe is been here for nurse visits. Miraculously the area between his first and second toes on the left dorsal foot is closed over. Still open on the right first second web space. The left lateral calf has 2 open areas. Distally this is more superficial. The proximal area had [name] little more depth and required debridement of adherent necrotic material. His buttock wound is actually larger we have been using silver alginate here 6/28; the patient's area on the left foot remains closed. Still open wet area between the first and second toes on the right and also extending into the plantar aspect. We have been using silver alginate in this location. He has 2 areas on the left lower leg part of his original long wounds which I think are better. We have been using Hydrofera Blue here.  Hydrofera Blue to the left buttock which is stable 7/12; left foot remains closed. Left ankle is closed. May be [name] small area between his right first and second toes the only truly open area is on the left buttock. We have been using Hydrofera Blue here 7/19; patient arrives with marked deterioration especially in the left foot and ankle. We did not put him in [name] compression wrap on the left last week in fact he wore his juxta lite stockings on either side although he does not have an underlying stocking. He has [name] reopening on the left dorsal foot, left lateral ankle and [name] new area on the right dorsal ankle. More worrisome is the degree of erythema on the left foot extending on the lateral foot into the lateral lower leg on the left 7/26; the patient had erythema and drainage from the lateral left ankle last week. Culture of this grew MRSA resistant to doxycycline and clindamycin which are the 2 antibiotics we usually use with this patient who has multiple antibiotic allergies including linezolid, trimethoprim sulfamethoxazole. I had give him an empiric doxycycline and he comes in the area certainly looks somewhat better although it is blotchy in his lower leg. He has not been systemically unwell. He has had areas on the left dorsal foot which is [name] reopening, chronic wounds on the left lateral ankle. Both of these I think are secondary to chronic venous insufficiency. The area between his first and second toes is closed as far as I can tell. He had [name] new wrap injury on the right dorsal ankle last week. Finally he has an area on the left buttock. We have been using silver alginate to everything except the left buttock we are using Hydrofera Blue 06/30/20-Patient returns at 1 week, has been given [name] sample dose pack of NUZYRA which is [name] tetracycline derivative [omadacycline], patient has completed those, we have been using silver alginate to almost all the wounds except the left ischium where we are using  Hydrofera Blue all of them look better 8/16; since I last saw the patient he has been doing well. The area on the left buttock, left lateral  ankle and left foot are all closed today. He has completed the [place] I gave him last time and tolerated this well. He still has open areas on the right dorsal ankle and in the right first second toe area which we are using silver alginate. 8/23; we put him in his bilateral external compression stockings last week as he did not have anything open on either leg except for concerning area between the right first and second toe. He comes in today with an area on the left dorsal foot slightly more proximal than the original wound, the left lateral foot but this is actually [name] continuation of the area he had on the left lateral ankle from last time. As well he is opened up on the left buttock again. 8/30; comes in today with things looking [name] lot better. The area on the left lower ankle has closed down as has the left foot but with eschar in both areas. The area on the dorsal right ankle is also epithelialized. Very little remaining of the left buttock wound. We have been using silver alginate on all wound areas Electronic Signature(s) Signed: 07/21/2020 4:22:50 PM By: Linton Ham MD Entered By: Linton Ham on 07/21/2020 08:51:35 -------------------------------------------------------------------------------- Physical Exam Details Patient Name: Date of Service: [name], [name] 07/21/2020 7:30 [name] Medical Record Number: [id number] Patient Account Number: [id number] Date of Birth/Sex: Treating RN: [birth year] (35 y.o. [name] Primary Care Provider: [place], [place] Other Clinician: Referring Provider: Treating Provider/Extender: Dutch Gray, [name] in Treatment: 237 Respiratory work of breathing is normal. Cardiovascular Pedal pulses are palpable. We have good edema control. Notes Wound exam The left buttock wound is almost closed.  He continues to have very irritated skin around [name] large area. The left lateral ankle wound which was his original reason to come to this clinic again is closed all of the vulnerable area inferiorly still somewhat scaled Left dorsal foot is also closed under illumination I did not think anything required debridement. Between the first and second toes on the right no identifiable wound still somewhat friable on the dorsal aspect here. The area on the dorsal ankle is finally epithelialized. Electronic Signature(s) Signed: 07/21/2020 4:22:50 PM By: Linton Ham MD Entered By: Linton Ham on 07/21/2020 08:54:34 -------------------------------------------------------------------------------- Physician Orders Details Patient Name: Date of Service: [name], [name] 07/21/2020 7:30 [name] Medical Record Number: [id number] Patient Account Number: [id number] Date of Birth/Sex: Treating RN: Dec 15, 1985 (34 y.o. [name] Primary Care Provider: [name], [name] Other Clinician: Referring Provider: Treating Provider/Extender: [name] in Treatment: [phone] Verbal / Phone Orders: No Diagnosis Coding ICD-10 Coding Code Description I87.332 Chronic venous hypertension (idiopathic) with ulcer and inflammation of left lower extremity L97.321 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 to breakdown of skin L97.311 Non-pressure chronic ulcer of right ankle limited to breakdown of skin L97.511 Non-pressure chronic ulcer of other part of right foot limited to breakdown of skin L97.521 Non-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 L03.116 Cellulitis of left lower limb 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 Nurse Visit: - 1 week for rewrap Dressing Change Frequency Wound #38 Right T - Web between 1st and 2nd oe Change Dressing every other  day. Wound #41R Left Ischium Change Dressing every other day. Skin Barriers/Peri-Wound Care Moisturizing lotion Wound Cleansing May shower and wash wound with soap and water. - on  days that dressing is changed Primary Wound Dressing Wound #38 Right T - Web between 1st and 2nd oe Calcium Alginate with Silver Wound #41R Left Ischium Calcium Alginate with Silver Secondary Dressing Wound #38 Right T - Web between 1st and 2nd oe Kerlix/Rolled Gauze Dry Gauze Wound #41R Left Ischium Foam Border - or ABD pad and tape Edema Control 4 layer compression: Left lower extremity - Use kerlix instead of cotton Elevate legs to the level of the heart or above for 30 minutes daily and/or when sitting, [name] frequency of: - throughout the day Support Garment 30-40 mm/Hg pressure to: - Juxtalite to right leg daily Off-Loading Low air-loss mattress (Group 2) Roho cushion for wheelchair Turn and reposition every 2 hours - out of wheelchair throughout the day, try to lay on sides, sleep in the bed not the recliner Electronic Signature(s) Signed: 07/21/2020 4:22:50 PM By: Linton Ham MD Signed: 07/21/2020 4:24:12 PM By: Levan Hurst RN, BSN Entered By: Levan Hurst on 07/21/2020 08:20:34 -------------------------------------------------------------------------------- Problem List Details Patient Name: Date of Service: [name], [name] 07/21/2020 7:30 [name] Medical Record Number: [id number] Patient Account Number: [id number] Date of Birth/Sex: Treating RN: 09/23/1986 (34 y.o. [name] Primary Care Provider: [place], [place] Other Clinician: Referring Provider: Treating Provider/Extender: [name] in Treatment: 237 Active Problems ICD-10 Encounter Code Description Active Date MDM Diagnosis I87.332 Chronic venous hypertension (idiopathic) with ulcer and inflammation of left 02/25/2020 No Yes lower extremity L97.321 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  to breakdown of skin 11/26/2019 No Yes L97.311 Non-pressure chronic ulcer of right ankle limited to breakdown of skin 06/09/2020 No Yes L97.511 Non-pressure chronic ulcer of other part of right foot limited to breakdown of 08/05/2016 No Yes skin L97.521 Non-pressure chronic ulcer of other part of left foot limited to breakdown of 07/25/2018 No Yes 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, stage 3 09/17/2019 No Yes 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, complete 01/02/2016 No Yes Inactive Problems ICD-10 Code Description Active Date Inactive Date 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, stage 3 01/02/2016 01/02/2016 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, stage 3 12/05/2017 12/05/2017 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116 Cellulitis of left lower limb 08/28/2018 08/28/2018 L03.115 Cellulitis of right lower limb 04/20/2018 04/20/2018 L03.116 Cellulitis of left lower limb 05/16/2018 05/16/2018 L03.115 Cellulitis of right lower limb 04/02/2019 04/02/2019 Electronic Signature(s) Signed: 07/21/2020 4:22:50 PM By: Linton Ham MD Signed: 07/21/2020 4:22:50 PM By: Linton Ham MD Entered By: Linton Ham on 07/21/2020 08:49:52 -------------------------------------------------------------------------------- Progress Note Details Patient Name: Date of Service: [name], [name] 07/21/2020 7:30 [name] Medical Record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 toe web space on the left and the dorsal aspect of the first second toe web space on the right. At one point I felt these were probably related to chronic fungal infections in treated him aggressively for this although we have not made any improvement here. 08/11/17; left lateral leg. Surface continues to improve with the Iodoflex although we are not seeing much improvement in overall wound dimensions. Areas on his plantar left foot and right foot show no improvement. In fact the right foot looks somewhat worse 08/18/17; left lateral leg. We changed to [facility] Blue last week after [name] prolonged course of Iodoflex which helps get the surface better. It appears that the wound with is improved. Continue with difficult areas on the left dorsal first second and plantar first second on the right 09/01/17; patient arrives in clinic today having had [name] temperature of 103 yesterday. He was seen in the ER and [facility]. The patient was concerned he could have cellulitis again in the right leg however they diagnosed him with [name] UTI and he is now on Keflex. He has [name] history of cellulitis which is been recurrent and difficult but this is been in the left leg, in the past 5 use doxycycline. He does in and out catheterizations at home which are risk factors for UTI 09/08/17; patient will be completing  his Keflex this weekend. The erythema on the left leg is considerably better. He has [name] new wound today on the medial part of the right leg small superficial almost looks like [name] skin tear. He has worsening of the area on the right dorsal first and  second toe. His major area on the left lateral leg is better. Using Hydrofera Blue on all areas 09/15/17; gradual reduction in width on the long wound in the left lateral leg. No debridement required. He also has wounds on the plantar aspect of his left first second toe web space and on the dorsal aspect of the right first second toe web space. 09/22/17; there continues to be very gradual improvements in the dimensions of the left lateral leg wound. He hasn't round erythematous spot with might be pressure on his wheelchair. There is no evidence obviously of infection no purulence no warmth ooHe has [name] dry scaled area on the plantar aspect of the left first second toe ooImproved area on the dorsal right first second toe. 09/29/17; left lateral leg wound continues to improve in dimensions mostly with an is still [name] fairly long but increasingly narrow wound. ooHe has [name] dry scaled area on the plantar aspect of his left first second toe web space ooIncreasingly concerning area on the dorsal right first second toe. In fact I am concerned today about possible cellulitis around this wound. The areas extending up his second toe and although there is deformities here almost appears to abut on the nailbed. 10/06/17; left lateral leg wound continues to make very gradual progress. Tissue culture I did from the right first second toe dorsal foot last time grew MRSA and enterococcus which was vancomycin sensitive. This was not sensitive to clindamycin or doxycycline. He is allergic to Zyvox and sulfa we have therefore arrange for him to have dalvance infusion tomorrow. He is had this in the past and tolerated it well 10/20/17; left lateral leg wound continues to make decent progress. This is certainly reduced in terms of with there is advancing epithelialization.ooThe cellulitis in the right foot looks better although he still has [name] deep wound in the dorsal aspect of the first second toe web space. Plantar left  first toe web space on the left I think is making some progress 10/27/17; left lateral leg wound continues to make decent progress. Advancing epithelialization.using Hydrofera Blue ooThe right first second toe web space wound is better-looking using silver alginate ooImprovement in the left plantar first second toe web space. Again using silver alginate 11/03/17 left lateral leg wound continues to make decent progress albeit slowly. Using Hydrofera Blue ooThe right per second toe web space continues to be [name] very problematic looking punched out wound. I obtained [name] piece of tissue for deep culture I did extensively treated this for fungus. It is difficult to imagine that this is [name] pressure area as the patient states other than going outside he doesn't really wear shoes at home ooThe left plantar first second toe web space looked fairly senescent. Necrotic edges. This required debridement oochange to Hydrofera Blue to all wound areas 11/10/17; left lateral leg wound continues to contract. Using Hydrofera Blue ooOn the right dorsal first second toe web space dorsally. Culture I did of this area last week grew MRSA there is not an easy oral option in this patient was multiple antibiotic allergies or intolerances. This was only [name] rare culture isolate I'[name] therefore going to use Bactroban under silver alginate ooOn the left plantar first second  toe web space. Debridement is required here. This is also unchanged 11/17/17; left lateral leg wound continues to contract using Hydrofera Blue this is no longer the major issue. ooThe major concern here is the right first second toe web space. He now has an open area going from dorsally to the plantar aspect. There is now wound on the inner lateral part of the first toe. Not [name] very viable surface on this. There is erythema spreading medially into the forefoot. ooNo major change in the left first second toe plantar wound 11/24/17; left lateral leg wound  continues to contract using Hydrofera Blue. Nice improvement today ooThe right first second toe web space all of this looks [name] lot less angry than last week. I have given him clindamycin and topical Bactroban for MRSA and terbinafine for the possibility of underlining tinea pedis that I could not control with ketoconazole. Looks somewhat better ooThe area on the plantar left first second toe web space is weeping with dried debris around the wound 12/01/17; left lateral leg wound continues to contract he Hydrofera Blue. It is becoming thinner in terms of with nevertheless it is making good improvement. ooThe right first second toe web space looks less angry but still [name] large necrotic-looking wounds starting on the plantar aspect of the right foot extending between the toes and now extensively on the base of the right second toe. I gave him clindamycin and topical Bactroban for MRSA anterior benefiting for the possibility of underlying tinea pedis. Not looking better today ooThe area on the left first/second toe looks better. Debrided of necrotic debris 12/05/17* the patient was worked in urgently today because over the weekend he found blood on his incontinence bad when he woke up. He was found to have an ulcer by his wife who does most of his wound care. He came in today for [place] to look at this. He has not had [name] history of wounds in his buttocks in spite of his paraplegia. 12/08/17; seen in follow-up today at his usual appointment. He was seen earlier this week and found to have [name] new wound on his buttock. We also follow him for wounds on the left lateral leg, left first second toe web space and right first second toe web space 12/15/17; we have been using Hydrofera Blue to the left lateral leg which has improved. The right first second toe web space has also improved. Left first second toe web space plantar aspect looks stable. The left buttock has worsened using Santyl. Apparently the buttock has  drainage 12/22/17; we have been using Hydrofera Blue to the left lateral leg which continues to improve now 2 small wounds separated by normal skin. He tells [place] he had [name] fever up to 100 yesterday he is prone to UTIs but has not noted anything different. He does in and out catheterizations. The area between the first and second toes today does not look good necrotic surface covered with what looks to be purulent drainage and erythema extending into the third toe. I had gotten this to something that I thought look better last time however it is not look good today. He also has [name] necrotic surface over the buttock wound which is expanded. I thought there might be infection under here so I removed [name] lot of the surface with [name] #5 curet though nothing look like it really needed culturing. He is been using Santyl to this area 12/27/17; his original wound on the left lateral leg continues to improve  using Hydrofera Blue. I gave him samples of Baxdella although he was unable to take them out of fear for an allergic reaction ["lump in his throat"].the culture I did of the purulent drainage from his second toe last week showed both enterococcus and [name] set Enterobacter I was also concerned about the erythema on the bottom of his foot although paradoxically although this looks somewhat better today. 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 We've been using silver alginate here. They came up today that he sleeps in [name] recliner, I'[name] not sure why but I asked him to stop this 01/03/18; his original wound we've been using Hydrofera Blue is now separated into 2 areas. ooUlcer on his left buttock is better he is off the recliner and sleeping in bed ooFinally both wound areas between his first and second toes also looks some better 01/10/18; his original wound on the left lateral leg is now separated into 2 wounds we've been using  Hydrofera Blue ooUlcer on his left buttock has some drainage. There is [name] small probing site going into muscle layer superiorly.using silver alginate -He arrives today with [name] deep tissue injury on the left heel ooThe wound on the dorsal aspect of his first second toe on the left looks [name] lot betterusing silver alginate ketoconazole ooThe area on the first second toe web space on the right also looks [name] lot bette 01/17/18; his original wound on the left lateral leg continues to progress using Hydrofera Blue ooUlcer on his left buttock also is smaller surface healthier except for [name] small probing site going into the muscle layer superiorly. 2.4 cm of tunneling in this area ooDTI on his left heel we have only been offloading. Looks better than last week no threatened open no evidence of infection oothe wound on the dorsal aspect of the first second toe on the left continues to look like it's regressing we have only been using silver alginate and terbinafine orally ooThe area in the first second toe web space on the right also looks to be [name] lot better using silver alginate and terbinafine I think this was prompted by tinea pedis 01/31/18; the patient was hospitalized in [place] last week apparently for [name] complicated UTI. He was discharged on cefepime he does in and out catheterizations. In the hospital he was discovered [name] I don't mild elevation of AST and ALT and the terbinafine was stopped.predictably the pressure ulcer on s his buttock looks betterusing silver alginate. The area on the left lateral leg also is better using Hydrofera Blue. The area between the first and second toes on the left better. First and second toes on the right still substantial but better. Finally the DTI on the left heel has held together and looks like it's resolving 02/07/18-he is here in follow-up evaluation for multiple ulcerations. He has new injury to the lateral aspect of the last issue [name] pressure ulcer, he states  this is from adhesive removal trauma. He states he has tried multiple adhesive products with no success. All other ulcers appear stable. The left heel DTI is resolving. We will continue with same treatment plan and follow-up next week. 02/14/18; follow-up for multiple areas. ooHe has [name] new area last week on the lateral aspect of his pressure ulcer more over the posterior trochanter. The original pressure ulcer looks quite stable has healthy granulation. 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 Almost fully epithelialized on the original superior area using Hydrofera Blue ooDTI on the left heel has peeled off this week to reveal [name] small superficial wound under denuded skin and subcutaneous tissue ooBoth areas between the first and second toes look better including nothing open on the left 02/21/18; ooThe patient's wounds on his left ischial tuberosity and posterior left greater trochanter actually looked better. He has [name] large area of irritation around the area which I think is contact dermatitis. I am doubtful that this is fungal ooHis original wound on the left lateral calf continues to improve we have been using Hydrofera Blue ooThere is no open area in the left first second toe web space although there is [name] lot of thick callus ooThe DTI on the left heel required debridement today of necrotic surface eschar and subcutaneous tissue using silver alginate ooFinally the area on the right first second toe webspace continues to contract using silver alginate and ketoconazole 02/28/18 ooLeft ischial tuberosity wounds look better using silver alginate. ooOriginal wound on the left calf only has one small open area left using Hydrofera Blue ooDTI on the left heel required debridement mostly removing skin from around this wound surface. Using silver alginate ooThe areas on the right  first/second toe web space using silver alginate and ketoconazole 03/08/18 on evaluation today patient appears to be doing decently well as best I can tell in regard to his wounds. This is the first time that I have seen him as he generally is followed by Dr. Dellia Nims. With that being said none of his wounds appear to be infected he does have an area where there is some skin covering what appears to be [name] new wound on the left dorsal surface of his great toe. This is right at the nail bed. With that being said I do believe that debrided away some of the excess skin can be of benefit in this regard. Otherwise he has been tolerating the dressing changes without complication. 03/14/18; patient arrives today with the multiplicity of wounds that we are following. He has not been systemically unwell ooOriginal wound on the left lateral calf now only has 2 small open areas we've been using Hydrofera Blue which should continue ooThe deep tissue injury on the left heel requires debridement today. We've been using silver alginate ooThe left first second toe and the right first second toe are both are reminiscence what I think was tinea pedis. Apparently some of the callus Surface between the toes was removed last week when it started draining. ooPurulent drainage coming from the wound on the ischial tuberosity on the left. 03/21/18-He is here in follow-up evaluation for multiple wounds. 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 He tolerated debridement. The only change to last week's recommendations is to discontinue antifungal cream between toes. He will follow-up next week 03/28/18; following up for multiple wounds;Concern this week is streaking redness and swelling in the right foot. He is going to need antibiotics for this. 03/31/18; follow-up for right foot cellulitis. Streaking redness and swelling in the right foot on 03/28/18. He has  multiple antibiotic intolerances and [name] history of MRSA. I put him on clindamycin 300 mg every 6 and brought him in for [name] quick check. He has an open wound between his first and second toes on the right foot as [name] potential source. 04/04/18; ooRight foot cellulitis  is resolving he is completing clindamycin. This is truly good news ooLeft lateral calf wound which is initial wound only has one small open area inferiorly this is close to healing out. He has compression stockings. We will use Hydrofera Blue right down to the epithelialization of this ooNonviable surface on the left heel which was initially pressure with [name] DTI. We've been using Hydrofera Blue. I'[name] going to switch this back to silver alginate ooLeft first second toe/tinea pedis this looks better using silver alginate ooRight first second toe tinea pedis using silver alginate ooLarge pressure ulcers on theLeft ischial tuberosity. Small wound here Looks better. I am uncertain about the surface over the large wound. Using silver alginate 04/11/18; ooCellulitis in the right foot is resolved ooLeft lateral calf wound which was his original wounds still has 2 tiny open areas remaining this is just about closed ooNonviable surface on the left heel is better but still requires debridement ooLeft first second toe/tinea pedis still open using silver alginate ooRight first second toe wound tinea pedis I asked him to go back to using ketoconazole and silver alginate ooLarge pressure ulcers on the left ischial tuberosity this shear injury here is resolved. Wound is smaller. No evidence of infection using silver alginate 04/18/18; ooPatient arrives with an intense area of cellulitis in the right mid lower calf extending into the right heel area. Bright red and warm. Smaller area on the left anterior leg. He has [name] significant history of MRSA. He will definitely need antibioticsoodoxycycline ooHe now has 2 open areas on the left ischial  tuberosity the original large wound and now [name] satellite area which I think was above his initial satellite areas. Not [name] wonderful surface on this satellite area surrounding erythema which looks like pressure related. ooHis left lateral calf wound again his original wound is just about closed ooLeft heel pressure injury still requiring debridement ooLeft first second toe looks [name] lot better using silver alginate ooRight first second toe also using silver alginate and ketoconazole cream also looks better 04/20/18; the patient was worked in early today out of concerns with his cellulitis on the right leg. I had started him on doxycycline. This was 2 days ago. His wife was concerned about the swelling in the area. Also concerned about the left buttock. He has not been systemically unwell no fever chills. No nausea vomiting or diarrhea 04/25/18; the patient's left buttock wound is continued to deteriorate he is using Hydrofera Blue. He is still completing clindamycin for the cellulitis on the right leg although all of this looks better. 05/02/18 ooLeft buttock wound still with [name] lot of drainage and [name] very tightly adherent fibrinous necrotic surface. He has [name] deeper area superiorly ooThe left lateral calf wound is still closed ooDTI wound on the left heel necrotic surface especially the circumference using Iodoflex ooAreas between his left first second toe and right first second toe both look better. Dorsally and the right first second toe he had [name] necrotic surface although at smaller. In using silver alginate and ketoconazole. I did [name] culture last week which was [name] deep tissue culture of the reminiscence of the open wound on the right first second toe dorsally. This grew [name] few Acinetobacter and [name] few methicillin-resistant staph aureus. Nevertheless the area actually this week looked better. I didn't feel the need to specifically address this at least in terms of systemic antibiotics. 05/09/18; wounds  are measuring larger more drainage per our intake. We are using Santyl covered with alginate  on the large superficial buttock wounds, Iodosorb on the left heel, ketoconazole and silver alginate to the dorsal first and second toes bilaterally. 05/16/18; ooThe area on his left buttock better in some aspects although the area superiorly over the ischial tuberosity required an extensive debridement.using Santyl ooLeft heel appears stable. Using Iodoflex ooThe areas between his first and second toes are not bad however there is spreading erythema up the dorsal aspect of his left foot this looks like cellulitis again. He is insensate the erythema is really very brilliant.o Erysipelas He went to see an allergist days ago because he was itching part of this he had lab work done. This showed [name] white count of 15.1 with 70% neutrophils. Hemoglobin of 11.4 and [name] platelet count of 659,000. Last white count we had in Epic was [name] 2-1/2 years ago which was 25.9 but he was ill at the time. He was able to show me some lab work that was done by his primary physician the pattern is about the same. 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 But prompted me to go ahead and do x-rays of both feet and the pelvis rule out osteomyelitis. He also had [name] comprehensive metabolic panel this was reasonably normal his albumin was 3.7 liver function tests BUN/creatinine all normal 05/23/18; x-rays of both his feet from last week were negative for underlying pulmonary abnormality. The x-ray of his pelvis however showed mild irregularity in the left ischial which may represent some early osteomyelitis. The wound in the left ischial continues to get deeper clearly now exposed muscle. Each week necrotic surface material over this area. Whereas the rest of the wounds do not look so bad. ooThe left ischial wound we have been using Santyl and calcium alginate ooT the left heel surface necrotic  debris using Iodoflex o ooThe left lateral leg is still healed ooAreas on the left dorsal foot and the right dorsal foot are about the same. There is some inflammation on the left which might represent contact dermatitis, fungal dermatitis I am doubtful cellulitis although this looks better than last week 05/30/18; CT scan done at Hospital did not show any osteomyelitis or abscess. Suggested the possibility of underlying cellulitis although I don't see [name] lot of evidence of this at the bedside ooThe wound itself on the left buttock/upper thigh actually looks somewhat better. No debridement ooLeft heel also looks better no debridement continue Iodoflex ooBoth dorsal first second toe spaces appear better using Lotrisone. Left still required debridement 06/06/18; ooIntake reported some purulent looking drainage from the left gluteal wound. Using Santyl and calcium alginate ooLeft heel looks better although still [name] nonviable surface requiring debridement ooThe left dorsal foot first/second webspace actually expanding and somewhat deeper. I may consider doing [name] shave biopsy of this area ooRight dorsal foot first/second webspace appears stable to improved. Using Lotrisone and silver alginate to both these areas 06/13/18 ooLeft gluteal surface looks better. Now separated in the 2 wounds. No debridement required. Still drainage. We'll continue silver alginate ooLeft heel continues to look better with Iodoflex continue this for at least another week ooOf his dorsal foot wounds the area on the left still has some depth although it looks better than last week. We've been using Lotrisone and silver alginate 06/20/18 ooLeft gluteal continues to look better healthy tissue ooLeft heel continues to look better healthy granulation wound is smaller. He is using Iodoflex and his long as this continues continue the Iodoflex   ooDorsal right foot looks better unfortunately dorsal left foot does not. There is  swelling and erythema of his forefoot. He had minor trauma to this several days ago but doesn't think this was enough to have caused any tissue injury. Foot looks like cellulitis, we have had this problem before 06/27/18 on evaluation today patient appears to be doing [name] little worse in regard to his foot ulcer. 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 Both of which would really be his only options for treating this infection. In the past he has been given and effusion of Orbactiv. This is done very well for him in the past again it's one time dosing IV antibiotic therapy. Subsequently I do believe this is something we're gonna need to see about doing at this point in time. Currently his other wounds seem to be doing somewhat better in my pinion I'[name] pretty happy in that regard. 07/03/18 on evaluation today patient's wounds actually appear to be doing fairly well. He has been tolerating the dressing changes without complication. All in all he seems to be showing signs of improvement. In regard to the antibiotics he has been dealing with infectious disease since I saw him last week as far as getting this scheduled. In the end he's [facility] this coming Friday. In the meantime he has been continuing to perform the dressing changes in such as previous. There does not appear to be any evidence of infection worsengin at this time. 07/10/18; ooSince I last saw this man 2 [name] ago things have actually improved. IV antibiotics of resulted in less forefoot erythema although there is still some present. He is not systemically unwell ooLeft buttock wounds o2 now have no depth there is increased epithelialization Using silver alginate ooLeft heel still requires debridement using Iodoflex ooLeft dorsal foot still  with [name] sizable wound about the size of [name] border but healthy granulation ooRight dorsal foot still with [name] slitlike area using silver alginate 07/18/18; the patient's cellulitis in the left foot is improved in fact I think it is on its way to resolving. ooLeft buttock wounds o2 both look better although the larger one has hypertension granulation we've been using silver alginate ooLeft heel has some thick circumferential redundant skin over the wound edge which will need to be removed today we've been using Iodoflex ooLeft dorsal foot is still [name] sizable wound required debridement using silver alginate ooThe right dorsal foot is just about closed only [name] small open area remains here 07/25/18; left foot cellulitis is resolved ooLeft buttock wounds o2 both look better. Hyper-granulation on the major area ooLeft heel as some debris over the surface but otherwise looks [name] healthier wound. Using silver collagen ooRight dorsal foot is just about closed 07/31/18; arrives with our intake nurse worried about purulent drainage from the buttock. We had hyper-granulation here last week ooHis buttock wounds o2 continue to look better ooLeft heel some debris over the surface but measuring smaller. ooRight dorsal foot unfortunately has openings between the toes ooLeft foot superficial wound looks less aggravated. 08/07/18 ooButtock wounds continue to look better although some of her granulation and the larger medial wound. silver alginate ooLeft heel continues to look [name] lot better.silver collagen ooLeft foot superficial wound looks less stable. Requires debridement. He has [name] new wound  superficial area on the foot on the lateral dorsal foot. ooRight foot looks better using silver alginate without Lotrisone 08/14/2018; patient was in the ER last week diagnosed with [name] UTI. He is now on Cefpodoxime and Macrodantin. ooButtock wounds continued to be smaller. Using silver alginate ooLeft heel continues to  look better using silver collagen ooLeft foot superficial wound looks as though it is improving ooRight dorsal foot area is just about healed. 08/21/2018; patient is completed his antibiotics for his UTI. ooHe has 2 open areas on the buttocks. There is still not closed although the surface looks satisfactory. Using silver alginate ooLeft heel continues to improve using silver collagen ooThe bilateral dorsal foot areas which are at the base of his first and second toes/possible tinea pedis are actually stable on the left but worse on the right. The area on the left required debridement of necrotic surface. After debridement I obtained [name] specimen for PCR culture. ooThe right dorsal foot which is been just about healed last week is now reopened 08/28/2018; culture done on the left dorsal foot showed coag negative staph both staph epidermidis and Lugdunensis. I think this is worthwhile initiating systemic treatment. I will use doxycycline given his long list of allergies. The area on the left heel slightly improved but still requiring debridement. ooThe large wound on the buttock is just about closed whereas the smaller one is larger. Using silver alginate in this area 09/04/2018; patient is completing his doxycycline for the left foot although this continues to be [name] very difficult wound area with very adherent necrotic debris. We are using silver alginate to all his wounds right foot left foot and the small wounds on his buttock, silver collagen on the left heel. 09/11/2018; once again this patient has intense erythema and swelling of the left forefoot. Lesser degrees of erythema in the right foot. He has [name] long list of allergies and intolerances. I will reinstitute doxycycline. 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 Using silver alginate ooLeft heel also looks better using silver collagen ooUnfortunately both the areas on his feet look  worse. The area on the left first second webspace is now gone through to the plantar part of his foot. The area on the left foot anteriorly is irritated with erythema and swelling in the forefoot. 09/25/2018 ooHis wound on the left plantar heel looks better. Using silver collagen ooThe area on the left buttock 2 small remnant areas. One is closed one is still open. Using silver alginate ooThe areas between both his first and second toes look worse. This in spite of long-standing antifungal therapy with ketoconazole and silver alginate which should have antifungal activity ooHe has small areas around his original wound on the left calf one is on the bottom of the original scar tissue and one superiorly both of these are small and superficial but again given wound history in this site this is worrisome 10/02/2018 ooLeft plantar heel continues to gradually contract using silver collagen ooLeft buttock wound is unchanged using silver alginate ooThe areas on his dorsal feet between his first and second toes bilaterally look about the same. 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 Small open area here. We have been using silver alginate 10/09/2018; ooLeft plantar heel continues to look healthy and contract.  No debridement is required ooLeft buttock slightly smaller with [name] tape injury wound just below which was new this week ooDorsal feet somewhat improved I have been using clindamycin ooLeft lateral looks lower extremity the actual open area looks worse although [name] lot of this is epithelialized. I am going to change to silver collagen today He has [name] lot more swelling in the right leg although this is not pitting not red and not particularly warm there is [name] lot of spasm in the right leg usually indicative of  people with paralysis of some underlying discomfort. We have reviewed his vascular status from 2017 he had [name] left greater saphenous vein ablation. I wonder about referring him back to vascular surgery if the area on the left leg continues to deteriorate. 10/16/2018 in today for follow-up and management of multiple lower extremity ulcers. His left Buttock wound is much lower smaller and almost closed completely. The wound to the left ankle has began to reopen with Epithelialization and some adherent slough. He has multiple new areas to the left foot and leg. The left dorsal foot without much improvement. Wound present between left great webspace and 2nd toe. Erythema and edema present right leg. Right LE ultrasound obtained on 10/10/18 was negative for DVT . 10/23/2018; ooLeft buttock is closed over. Still dry macerated skin but there is no open wound. I suspect this is chronic pressure/moisture ooLeft lateral calf is quite [name] bit worse than when I saw this last. There is clearly drainage here he has macerated skin into the left plantar heel. We will change the primary dressing to alginate ooLeft dorsal foot has some improvement in overall wound area. Still using clindamycin and silver alginate ooRight dorsal foot about the same as the left using clindamycin and silver alginate ooThe erythema in the right leg has resolved. He is DVT rule out was negative ooLeft heel pressure area required debridement although the wound is smaller and the surface is health 10/26/2018 ooThe patient came back in for his nurse check today predominantly because of the drainage coming out of the left lateral leg with [name] recent reopening of his original wound on the left lateral calf. He comes in today with [name] large amount of surrounding erythema around the wound extending from the calf into the ankle and even in the area on the dorsal foot. He is not systemically unwell. He is not febrile. Nevertheless this looks like  cellulitis. We have been using silver alginate to the area. I changed him to [name] regular visit and I am going to prescribe him doxycycline. The rationale here is [name] long list of medication intolerances and [name] history of MRSA. I did not see anything that I thought would provide [name] valuable culture 10/30/2018 ooFollow-up from his appointment 4 days ago with really an extensive area of cellulitis in the left calf left lateral ankle and left dorsal foot. I put him on doxycycline. He has [name] long list of medication allergies which are true allergy reactions. 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 In any case he is [name] lot better today. The erythema especially in the anterior and lateral left calf is better. He still has left ankle erythema. He also is complaining about increasing edema in the right leg we have only been using Kerlix Coban and he has been doing the wraps at home. Finally he has [name] spotty rash on the medial part of his upper left calf  which looks like folliculitis or perhaps wrap occlusion type injury. Small superficial macules not pustules 11/06/18 patient arrives today with again [name] considerable degree of erythema around the wound on the left lateral calf extending into the dorsal ankle and dorsal foot. This is [name] lot worse than when I saw this last week. He is on doxycycline really with not [name] lot of improvement. He has not been systemically unwell Wounds on the; left heel actually looks improved. Original area on the left foot and proximity to the first and second toes looks about the same. He has superficial areas on the dorsal foot, anterior calf and then the reopening of his original wound on the left lateral calf which looks about the same ooThe only area he has on the right is the dorsal webspace first and second which is smaller. ooHe has [name] large area of dry erythematous skin on the left buttock small open area  here. 11/13/2018; the patient arrives in much better condition. The erythema around the wound on the left lateral calf is [name] lot better. Not sure whether this was the clindamycin or the TCA and ketoconazole or just in the improvement in edema control [stasis dermatitis]. In any case this is [name] lot better. The area on the left heel is very small and just about resolved using silver collagen we have been using silver alginate to the areas on his dorsal feet 11/20/2018; his wounds include the left lateral calf, left heel, dorsal aspects of both feet just proximal to the first second webspace. He is stable to slightly improved. I did not think any changes to his dressings were going to be necessary 11/27/2018 he has [name] reopening on the left buttock which is surrounded by what looks like tinea or perhaps some other form of dermatitis. The area on the left dorsal foot has some erythema around it I have marked this area but I am not sure whether this is cellulitis or not. Left heel is not closed. Left calf the reopening is really slightly longer and probably worse 1/13; in general things look better and smaller except for the left dorsal foot. Area on the left heel is just about closed, left buttock looks better only [name] small wound remains in the skin looks better [using Lotrisone] 1/20; the area on the left heel only has [name] few remaining open areas here. Left lateral calf about the same in terms of size, left dorsal foot slightly larger right lateral foot still not closed. The area on the left buttock has no open wound and the surrounding skin looks [name] lot better 1/27; the area on the left heel is closed. Left lateral calf better but still requiring extensive debridements. The area on his left buttock is closed. He still has the open areas on the left dorsal foot which is slightly smaller in the right foot which is slightly expanded. We have been using Iodoflex on these areas as well 2/3; left heel is closed. Left  lateral calf still requiring debridement using Iodoflex there is no open area on his left buttock however he has dry scaly skin over [name] large area of this. Not really responding well to the Lotrisone. Finally the areas on his dorsal feet at the level of the first second webspace are slightly smaller on the right and about the same on the left. Both of these vigorously debrided with Anasept and gauze 2/10; left heel remains closed he has dry erythematous skin over the left buttock but there is  no open wound here. Left lateral leg has come in and with. Still requiring debridement we have been using Iodoflex here. Finally the area on the left dorsal foot and right dorsal foot are really about the same extremely dry callused fissured areas. He does not yet have [name] dermatology appointment 2/17; left heel remains closed. He has [name] new open area on the left buttock. The area on the left lateral calf is bigger longer and still covered in necrotic debris. No major change in his foot areas bilaterally. I am awaiting for [name] dermatologist to look on this. We have been using ketoconazole I do not know that this is been doing any good at all. 2/24; left heel remains closed. The left buttock wound that was new reopening last week looks better. The left lateral calf appears better also although still requires debridement. The major area on his foot is the left first second also requiring debridement. We have been putting Prisma on all wounds. I do not believe that the ketoconazole has done too much good for his feet. He will use Lotrisone I am going to give him [name] 2-week course of terbinafine. We still do not have [name] dermatology appointment 3/2 left heel remains closed however there is skin over bone in this area I pointed this out to him today. The left buttock wound is epithelialized but still does not look completely stable. The area on the left leg required debridement were using silver collagen here. With regards to  his feet we changed to Lotrisone last week and silver alginate. 3/9; left heel remains closed. Left buttock remains closed. The area on the right foot is essentially closed. The left foot remains unchanged. Slightly smaller on the left lateral calf. Using silver collagen to both of these areas 3/16-Left heel remains closed. Area on right foot is closed. Left lateral calf above the lateral malleolus open wound requiring debridement with easy bleeding. Left dorsal wound proximal to first toe also debrided. Left ischial area open new. Patient has been using Prisma with wrapping every 3 days. Dermatology appointment is apparently tomorrow.Patient has completed his terbinafine 2-week course with some apparent improvement according to him, there is still flaking and dry skin in his foot on the left 3/23; area on the right foot is reopened. The area on the left anterior foot is about the same still [name] very necrotic adherent surface. He still has the area on the left leg and reopening is on the left buttock. He apparently saw dermatology although I do not have [name] note. According to the patient who is usually fairly well informed they did not have any good ideas. Put him on oral terbinafine which she is been on before. 3/30; using silver collagen to all wounds. Apparently his dermatologist put him on doxycycline and rifampin presumably some culture grew staph. I do not have this result. He remains on terbinafine although I have used terbinafine on him before 4/6; patient has had [name] fairly substantial reopening on the right foot between the first and second toes. He is finished his terbinafine and I believe is on doxycycline and rifampin still as prescribed by dermatology. We have been using silver collagen to all his wounds although the patient reports that he thinks silver alginate does better on the wounds on his buttock. 4/13; the area on his left lateral calf about the same size but it did not require  debridement. ooLeft dorsal foot just proximal to the webspace between the first and second toes  is about the same. Still nonviable surface. I note some superficial bronze discoloration of the dorsal part of his foot ooRight dorsal foot just proximal to the first and second toes also looks about the same. I still think there may be the same discoloration I noted above on the left ooLeft buttock wound looks about the same 4/20; left lateral calf appears to be gradually contracting using silver collagen. 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 The left dorsal foot wound is debrided of tightly adherent necrotic debris and really cleans up quite nicely. The right area is worse with expansion. I did not debride this it is now over the base of the second toe ooThe area on his left buttock is smaller no debridement is required using silver collagen 5/4; left calf continues to make good progress. ooHe arrives with erythema around the wounds on his dorsal foot which even extends to the plantar aspect. Very concerning for coexistent infection. He is finished the erythromycin I gave him for possible erythrasma this does not seem to have helped. ooThe area on the left foot is about the same base of the dorsal toes ooIs area on the buttock looks improved on the left 5/11; left calf and left buttock continued to make good progress. Left foot is about the same to slightly improved. ooMajor problem is on the right foot. He has not had an x-ray. Deep tissue culture I did last week showed both Enterobacter and E. coli. I did not change the doxycycline I put him on empirically although neither 1 of these were plated to doxycycline. He arrives today with the erythema looking worse on both the dorsal and plantar foot. Macerated skin on the bottom of the foot. he has not been systemically unwell 5/18-Patient returns at 1 week, left calf wound appears  to be making some progress, left buttock wound appears slightly worse than last time, left foot wound looks slightly better, right foot redness is marginally better. X-ray of both feet show no air or evidence of osteomyelitis. Patient is finished his Omnicef and terbinafine. He continues to have macerated skin on the bottom of the left foot as well as right 5/26; left calf wound is better, left buttock wound appears to have multiple small superficial open areas with surrounding macerated skin. X-rays that I did last time showed no evidence of osteomyelitis in either foot. He is finished cefdinir and doxycycline. I do not think that he was on terbinafine. He continues to have [name] large superficial open area on the right foot anterior dorsal and slightly between the first and second toes. I did send him to dermatology 2 months ago or so wondering about whether they would do [name] fungal scraping. I do not believe they did but did do [name] culture. We have been using silver alginate to the toe areas, he has been using antifungals at home topically either ketoconazole or Lotrisone. We are using silver collagen on the left foot, silver alginate on the right, silver collagen on the left lateral leg and silver alginate on the left buttock 6/1; left buttock area is healed. We have the left dorsal foot, left lateral leg and right dorsal foot. We are using silver alginate to the areas on both feet and silver collagen to the area on his left lateral calf 6/8; the left buttock apparently reopened late last week. He is not really sure how this happened. He is tolerating the terbinafine. Using silver alginate to all  wounds 6/15; left buttock wound is larger than last week but still superficial. ooCame in the clinic today with [name] report of purulence from the left lateral leg I did not identify any infection ooBoth areas on his dorsal feet appear to be better. He is tolerating the terbinafine. Using silver alginate to all  wounds 6/22; left buttock is about the same this week, left calf quite [name] bit better. His left foot is about the same however he comes in with erythema and warmth in the right forefoot once again. Culture that I gave him in the beginning of May showed Enterobacter and E. coli. I gave him doxycycline and things seem to improve although neither 1 of these organisms was specifically plated. 6/29; left buttock is larger and dry this week. Left lateral calf looks to me to be improved. Left dorsal foot also somewhat improved right foot completely unchanged. The erythema on the right foot is still present. He is completing the Ceftin dinner that I gave him empirically [see discussion above.) 7/6 - All wounds look to be stable and perhaps improved, the left buttock wound is slightly smaller, per patient bleeds easily, completed ceftin, the right foot redness is less, he is on terbinafine 7/13; left buttock wound about the same perhaps slightly narrower. Area on the left lateral leg continues to narrow. Left dorsal foot slightly smaller right foot about the same. We are using silver alginate on the right foot and Hydrofera Blue to the areas on the left. Unna boot on the left 2 layer compression on the right 7/20; left buttock wound absolutely the same. Area on lateral leg continues to get better. Left dorsal foot require debridement as did the right no major change in the 7/27; left buttock wound the same size necrotic debris over the surface. The area on the lateral leg is closed once again. His left foot looks better right foot about the same although there is some involvement now of the posterior first second toe area. He is still on terbinafine which I have given him for [name] month, not certain [name] centimeter major change 06/25/19-All wounds appear to be slightly improved according to report, left buttock wound looks clean, both foot wounds have minimal to no debris the right dorsal foot has minimal slough. We  are using Hydrofera Blue to the left and silver alginate to the right foot and ischial wound. 8/10-Wounds all appear to be around the same, the right forefoot distal part has some redness which was not there before, however the wound looks clean and small. Ischial wound looks about the same with no changes 8/17; his wound on the left lateral calf which was his original chronic venous insufficiency wound remains closed. Since I last saw him the areas on the left dorsal foot right dorsal foot generally appear better but require debridement. The area on his left initial tuberosity appears somewhat larger to me perhaps hyper granulated and bleeds very easily. We have been using Hydrofera Blue to the left dorsal foot and silver alginate to everything else 8/24; left lateral calf remains closed. The areas on his dorsal feet on the webspace of the first and second toes bilaterally both look better. The area on the left buttock which is the pressure ulcer stage II slightly smaller. I change the dressing to Hydrofera Blue to all areas 8/31; left lateral calf remains closed. The area on his dorsal feet bilaterally look better. Using Hydrofera Blue. Still requiring debridement on the left foot. No change in  the left buttock pressure ulcers however 9/14; left lateral calf remains closed. Dorsal feet look quite [name] bit better than 2 [name] ago. Flaking dry skin also [name] lot better with the ammonium lactate I gave him 2 [name] ago. The area on the left buttock is improved. He states that his Roho cushion developed [name] leak and he is getting [name] new one, in the interim he is offloading this vigorously 9/21; left calf remains closed. Left heel which was [name] possible DTI looks better this week. He had macerated tissue around the left dorsal foot right foot looks satisfactory and improved left buttock wound. I changed his dressings to his feet to silver alginate bilaterally. Continuing Hydrofera Blue on the left buttock. 9/28  left calf remains closed. Left heel did not develop anything [possible DTI] dry flaking skin on the left dorsal foot. Right foot looks satisfactory. Improved left buttock wound. We are using silver alginate on his feet Hydrofera Blue on the buttock. I have asked him to go back to the Lotrisone on his feet including the wounds and surrounding areas 10/5; left calf remains closed. The areas on the left and right feet about the same. [name] lot of this is epithelialized however debris over the remaining open areas. He is using Lotrisone and silver alginate. The area on the left buttock using Hydrofera Blue 10/26. Patient has been out for 3 [name] secondary to Covid concerns. He tested negative but I think his wife tested positive. He comes in today with the left foot substantially worse, right foot about the same. Even more concerning he states that the area on his left buttock closed over but then reopened and is considerably deeper in one aspect than it was before [stage III wound] 11/2; left foot really about the same as last week. Quarter sized wound on the dorsal foot just proximal to the first second toes. Surrounding erythema with areas of denuded epithelium. This is not really much different looking. Did not look like cellulitis this time however. ooRight foot area about the same.. We have been using silver alginate alginate on his toes ooLeft buttock still substantial irritated skin around the wound which I think looks somewhat better. We have been using Hydrofera Blue here. 11/9; left foot larger than last week and [name] very necrotic surface. Right foot I think is about the same perhaps slightly smaller. Debris around the circumference also addressed. Unfortunately on the left buttock there is been [name] decline. Satellite lesions below the major wound distally and now [name] an additional one posteriorly we have been using Hydrofera Blue but I think this is [name] pressure issue 11/16; left foot ulcer dorsally  again [name] very adherent necrotic surface. Right foot is about the same. Not much change in the pressure ulcer on his left buttock. 11/30; left foot ulcer dorsally basically the same as when I saw him 2 [name] ago. Very adherent fibrinous debris on the wound surface. Patient reports [name] lot of drainage as well. 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 We have been using Iodoflex, patient changed back to alginate because of the drainage. Area on his right dorsal foot really looks benign with [name] healthier surface certainly [name] lot better than on the left. Left buttock wounds all improved using Hydrofera Blue 12/7; left dorsal foot again no improvement. Tightly adherent debris. PCR culture I did last week only showed likely skin contaminant. I have gone ahead and done [name] punch biopsy of this  which is about the last thing in terms of investigations I can think to do. He has known venous insufficiency and venous hypertension and this could be the issue here. The area on the right foot is about the same left buttock slightly worse according to our intake nurse secondary to [facility] Blue sticking to the wound 12/14; biopsy of the left foot that I did last time showed changes that could be related to wound healing/chronic stasis dermatitis phenomenon no neoplasm. We have been using silver alginate to both feet. I change the one on the left today to Sorbact and silver alginate to his other 2 wounds 12/28; the patient arrives with the following problems; ooMajor issue is the dorsal left foot which continues to be [name] larger deeper wound area. Still with [name] completely nonviable surface ooParadoxically the area mirror image on the right on the right dorsal foot appears to be getting better. ooHe had some loss of dry denuded skin from the lower part of his original wound on the left lateral calf. Some of this area looked [name] little vulnerable and for this reason we put him  in wrap that on this side this week ooThe area on his left buttock is larger. He still has the erythematous circular area which I think is [name] combination of pressure, sweat. This does not look like cellulitis or fungal dermatitis 11/26/2019; -Dorsal left foot large open wound with depth. Still debris over the surface. Using Sorbact ooThe area on the dorsal right foot paradoxically has closed over [facility] has [name] reopening on the left ankle laterally at the base of his original wound that extended up into the calf. This appears clean. ooThe left buttock wound is smaller but with very adherent necrotic debris over the surface. We have been using silver alginate here as well The patient had arterial studies done in 2017. He had biphasic waveforms at the dorsalis pedis and posterior tibial bilaterally. ABI in the left was 1.17. Digit waveforms were dampened. He has slight spasticity in the great toes I do not think [name] TBI would be possible 1/11; the patient comes in today with [name] sizable reopening between the first and second toes on the right. This is not exactly in the same location where we have been treating wounds previously. According to our intake nurse this was actually fairly deep but 0.6 cm. The area on the left dorsal foot looks about the same the surface is somewhat cleaner using Sorbact, his MRI is in 2 days. We have not managed yet to get arterial studies. The new reopening on the left lateral calf looks somewhat better using alginate. The left buttock wound is about the same using alginate 1/18; the patient had his ARTERIAL studies which were quite normal. ABI in the right at 1.13 with triphasic/biphasic waveforms on the left ABI 1.06 again with triphasic/biphasic waveforms. It would not have been possible to have done [name] toe brachial index because of spasticity. We have been using Sorbac to the left foot alginate to the rest of his wounds on the right foot left lateral calf and left  buttock 1/25; arrives in clinic with erythema and swelling of the left forefoot worse over the first MTP area. This extends laterally dorsally and but also posteriorly. Still has an area on the left lateral part of the lower part of his calf wound it is eschared and clearly not closed. ooArea on the left buttock still with surrounding irritation and erythema. ooRight foot surface wound dorsally. The  area between the right and first and second toes appears better. 2/1; ooThe left foot wound is about the same. Erythema slightly better I gave him [name] week of doxycycline empirically ooRight foot wound is more extensive extending between the toes to the plantar surface ooLeft lateral calf really no open surface on the inferior part of his original wound however the entire area still looks vulnerable ooAbsolutely no improvement in the left buttock wound required debridement. 2/8; the left foot is about the same. Erythema is slightly improved I gave him clindamycin last week. ooRight foot looks better he is using Lotrimin and silver alginate ooHe has [name] breakdown in the left lateral calf. Denuded epithelium which I have removed ooLeft buttock about the same were using Hydrofera Blue 2/15; left foot is about the same there is less surrounding erythema. Surface still has tightly adherent debris which I have debriding however not making any progress ooRight foot has [name] substantial wound on the medial right second toe between the first and second webspace. ooStill an open area on the left lateral calf distal area. ooButtock wound is about the same 2/22; left foot is about the same less surrounding erythema. Surface has adherent debris. Polymen Ag Right foot area significant wound between the first and second toes. We have been using silver alginate here Left lateral leg polymen Ag at the base of his original venous insufficiency wound ooLeft buttock some improvement here 3/1; ooRight foot is  deteriorating in the first second toe webspace. Larger and more substantial. We have been using silver alginate. ooLeft dorsal foot about the same markedly adherent surface debris using PolyMem Ag ooLeft lateral calf surface debris using PolyMem AG ooLeft buttock is improved again using PolyMem Ag. ooHe is completing his terbinafine. The erythema in the foot seems better. He has been on this for 2 [name] 3/8; no improvement in any wound area in fact he has [name] small open area on the dorsal midfoot which is new this week. He has not gotten his foot x-rays yet 3/15; his x-rays were both negative for osteomyelitis of both feet. No major change in any of his wounds on the extremities however his buttock wounds are better. We have been using polymen on the buttocks, left lower leg. Iodoflex on the left foot and silver alginate on the right 3/22; arrives in clinic today with the 2 major issues are the improvement in the left dorsal foot wound which for once actually looks healthy with [name] nice healthy wound surface without debridement. Using Iodoflex here. Unfortunately on the left lateral calf which is in the distal part of his original wound he came to the clinic here for there was purulent drainage noted some increased breakdown scattered around the original area and [name] small area proximally. We we are using polymen here will change to silver alginate today. His buttock wound on the left is better and I think the area on the right first second toe webspace is also improved 3/29; left dorsal foot looks better. Using Iodoflex. Left ankle culture from deterioration last time grew E. coli, Enterobacter and Enterococcus. I will give him [name] course of cefdinir although that will not cover Enterococcus. The area on the right foot in the webspace of the first and second toe lateral first toe looks better. The area on his buttock is about healed Vascular appointment is on April 21. This is to look at his venous  system vis--vis continued breakdown of the wounds on the left including the left  lateral leg and left dorsal foot he. He has had previous ablations on this side 4/5; the area between the right first and second toes lateral aspect of the first toe looks better. Dorsal aspect of the left first toe on the left foot also improved. Unfortunately the left lateral lower leg is larger and there is [name] second satellite wound superiorly. The usual superficial abrasions on the left buttock overall better but certainly not closed 4/12; the area between the right first and second toes is improved. Dorsal aspect of the left foot also slightly smaller with [name] vibrant healthy looking surface. No real change in the left lateral leg and the left buttock wound is healed He has an unaffordable co-pay for Apligraf. Appointment with vein and vascular with regards to the left leg venous part of the circulation is on 4/21 4/19; we continue to see improvement in all wound areas. Although this is minor. He has his vascular appointment on 4/21. The area on the left buttock has not reopened although right in the center of this area the skin looks somewhat threatened 4/26; the left buttock is unfortunately reopened. In general his left dorsal foot has [name] healthy surface and looks somewhat smaller although it was not measured as such. The area between his first and second toe webspace on the right as [name] small wound against the first toe. The patient saw vascular surgery. The real question I was asking was about the small saphenous vein on the left. He has previously ablated left greater saphenous vein. Nothing further was commented on on the left. Right greater saphenous vein without reflux at the saphenofemoral junction or proximal thigh there was no indication for ablation of the right greater saphenous vein duplex was negative for DVT bilaterally. They did not think there was anything from [name] vascular surgery point of view that  could be offered. They ABIs within normal limits 5/3; only small open area on the left buttock. The area on the left lateral leg which was his original venous reflux is now 2 wounds both which look clean. We are using Iodoflex on the left dorsal foot which looks healthy and smaller. He is down to [name] very tiny area between the right first and second toes, using silver alginate 5/10; all of his wounds appear better. We have much better edema control in 4 layer compression on the left. This may be the factor that is allowing the left foot and left lateral calf to heal. He has external compression garments at home 04/14/20-All of his wounds are progressing well, the left forefoot is practically closed, left ischium appears to be about the same, right toe webspace is also smaller. The left lateral leg is about the same, continue using Hydrofera Blue to this, silver alginate to the ischium, Iodoflex to the toe space on the right 6/7; most of his wounds outside of the left buttock are doing well. The area on the left lateral calf and left dorsal foot are smaller. The area on the right foot in between the first and second toe webspace is barely visible although he still says there is some drainage here is the only reason I did not heal this out. ooUnfortunately the area on the left buttock almost looks like he has [name] skin tear from tape. He has open wound and then [name] large flap of skin that we are trying to get adherence over an area just next to the remaining wound 6/21; 2 week follow-up. I believe is been  here for nurse visits. Miraculously the area between his first and second toes on the left dorsal foot is closed over. Still open on the right first second web space. The left lateral calf has 2 open areas. Distally this is more superficial. The proximal area had [name] little more depth and required debridement of adherent necrotic material. His buttock wound is actually larger we have been using silver alginate  here 6/28; the patient's area on the left foot remains closed. Still open wet area between the first and second toes on the right and also extending into the plantar aspect. We have been using silver alginate in this location. He has 2 areas on the left lower leg part of his original long wounds which I think are better. We have been using Hydrofera Blue here. Hydrofera Blue to the left buttock which is stable 7/12; left foot remains closed. Left ankle is closed. May be [name] small area between his right first and second toes the only truly open area is on the left buttock. We have been using Hydrofera Blue here 7/19; patient arrives with marked deterioration especially in the left foot and ankle. We did not put him in [name] compression wrap on the left last week in fact he wore his juxta lite stockings on either side although he does not have an underlying stocking. He has [name] reopening on the left dorsal foot, left lateral ankle and [name] new area on the right dorsal ankle. More worrisome is the degree of erythema on the left foot extending on the lateral foot into the lateral lower leg on the left 7/26; the patient had erythema and drainage from the lateral left ankle last week. Culture of this grew MRSA resistant to doxycycline and clindamycin which are the 2 antibiotics we usually use with this patient who has multiple antibiotic allergies including linezolid, trimethoprim sulfamethoxazole. I had give him an empiric doxycycline and he comes in the area certainly looks somewhat better although it is blotchy in his lower leg. He has not been systemically unwell. He has had areas on the left dorsal foot which is [name] reopening, chronic wounds on the left lateral ankle. Both of these I think are secondary to chronic venous insufficiency. The area between his first and second toes is closed as far as I can tell. He had [name] new wrap injury on the right dorsal ankle last week. Finally he has an area on the left buttock. We  have been using silver alginate to everything except the left buttock we are using Hydrofera Blue 06/30/20-Patient returns at 1 week, has been given [name] sample dose pack of NUZYRA which is [name] tetracycline derivative [omadacycline], patient has completed those, we have been using silver alginate to almost all the wounds except the left ischium where we are using Hydrofera Blue all of them look better 8/16; since I last saw the patient he has been doing well. The area on the left buttock, left lateral ankle and left foot are all closed today. He has completed the [place] I gave him last time and tolerated this well. He still has open areas on the right dorsal ankle and in the right first second toe area which we are using silver alginate. 8/23; we put him in his bilateral external compression stockings last week as he did not have anything open on either leg except for concerning area between the right first and second toe. He comes in today with an area on the left dorsal foot slightly  more proximal than the original wound, the left lateral foot but this is actually [name] continuation of the area he had on the left lateral ankle from last time. As well he is opened up on the left buttock again. 8/30; comes in today with things looking [name] lot better. The area on the left lower ankle has closed down as has the left foot but with eschar in both areas. The area on the dorsal right ankle is also epithelialized. Very little remaining of the left buttock wound. We have been using silver alginate on all wound areas Objective Constitutional Vitals Time Taken: 7:48 AM, Height: 70 in, Weight: 216 lbs, BMI: 31, Temperature: 98.3 F, Pulse: 106 bpm, Respiratory Rate: 16 breaths/min, Blood Pressure: 139/72 mmHg. Respiratory work of breathing is normal. Cardiovascular Pedal pulses are palpable. We have good edema control. General Notes: Wound exam ooThe left buttock wound is almost closed. He continues to have very  irritated skin around [name] large area. ooThe left lateral ankle wound which was his original reason to come to this clinic again is closed all of the vulnerable area inferiorly still somewhat scaled ooLeft dorsal foot is also closed under illumination I did not think anything required debridement. ooBetween the first and second toes on the right no identifiable wound still somewhat friable on the dorsal aspect here. ooThe area on the dorsal ankle is finally epithelialized. Integumentary (Hair, Skin) Wound #38 status is Open. Original cause of wound was Gradually Appeared. The wound is located on the Right T - Web between 1st and 2nd. The wound oe measures 0.2cm length x 0.2cm width x 0.1cm depth; 0.031cm^2 area and 0.003cm^3 volume. There is Fat Layer (Subcutaneous Tissue) exposed. There is no tunneling or undermining noted. There is [name] medium amount of serosanguineous drainage noted. The wound margin is distinct with the outline attached to the wound base. There is large (67-100%) pink granulation within the wound bed. There is no necrotic tissue within the wound bed. Wound #41R status is Open. Original cause of wound was Gradually Appeared. The wound is located on the Left Ischium. The wound measures 0.5cm length x 0.5cm width x 0.1cm depth; 0.196cm^2 area and 0.02cm^3 volume. There is Fat Layer (Subcutaneous Tissue) exposed. There is no tunneling or undermining noted. There is [name] medium amount of serosanguineous drainage noted. The wound margin is distinct with the outline attached to the wound base. There is large (67-100%) pink granulation within the wound bed. There is no necrotic tissue within the wound bed. Wound #42 status is Healed - Epithelialized. Original cause of wound was Gradually Appeared. The wound is located on the Right,Dorsal Ankle. The wound measures 0cm length x 0cm width x 0cm depth; 0cm^2 area and 0cm^3 volume. There is no tunneling or undermining noted. There is [name] none present  amount of drainage noted. The wound margin is flat and intact. There is no granulation within the wound bed. There is no necrotic tissue within the wound bed. Wound #44 status is Healed - Epithelialized. Original cause of wound was Gradually Appeared. The wound is located on the Left,Medial Foot. The wound measures 0cm length x 0cm width x 0cm depth; 0cm^2 area and 0cm^3 volume. There is no tunneling or undermining noted. There is [name] none present amount of drainage noted. The wound margin is distinct with the outline attached to the wound base. There is no granulation within the wound bed. There is no necrotic tissue within the wound bed. Wound #45 status is Healed - Epithelialized.  Original cause of wound was Gradually Appeared. The wound is located on the Left,Lateral Foot. The wound measures 0cm length x 0cm width x 0cm depth; 0cm^2 area and 0cm^3 volume. There is no tunneling or undermining noted. There is [name] none present amount of drainage noted. The wound margin is distinct with the outline attached to the wound base. There is no granulation within the wound bed. There is no necrotic tissue within the wound bed. Assessment Active Problems ICD-10 Chronic venous hypertension (idiopathic) with ulcer and inflammation of left lower extremity 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 to breakdown of skin Non-pressure chronic ulcer of right ankle limited to breakdown of skin Non-pressure chronic ulcer of other part of right foot limited to breakdown of skin Non-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 Pressure ulcer of left buttock, stage 3 Paraplegia, complete Procedures There was [name] Four Layer Compression Therapy Procedure by Levan Hurst, RN. Post procedure Diagnosis Wound #: Same as Pre-Procedure Plan Follow-up Appointments: Return Appointment in 2 [name]. Nurse Visit: - 1 week for rewrap Dressing Change Frequency: Wound #38 Right T - Web  between 1st and 2nd: oe Change Dressing every other day. Wound #41R Left Ischium: Change Dressing every other day. Skin Barriers/Peri-Wound Care: Moisturizing lotion Wound Cleansing: May shower and wash wound with soap and water. - on days that dressing is changed Primary Wound Dressing: Wound #38 Right T - Web between 1st and 2nd: oe Calcium Alginate with Silver Wound #41R Left Ischium: Calcium Alginate with Silver Secondary Dressing: Wound #38 Right T - Web between 1st and 2nd: oe Kerlix/Rolled Gauze Dry Gauze Wound #41R Left Ischium: Foam Border - or ABD pad and tape Edema Control: 4 layer compression: Left lower extremity - Use kerlix instead of cotton Elevate legs to the level of the heart or above for 30 minutes daily and/or when sitting, [name] frequency of: - throughout the day Support Garment 30-40 mm/Hg pressure to: - Juxtalite to right leg daily Off-Loading: Low air-loss mattress (Group 2) Roho cushion for wheelchair Turn and reposition every 2 hours - out of wheelchair throughout the day, try to lay on sides, sleep in the bed not the recliner #1 we are going to use his external compression garment stocking/juxta light on the right leg. There is nothing open here 2. Silver alginate to the remaining concerning areas 3. I am going to wrap his left leg, asked him to bring the juxta lite stocking next week 4. Dressing his left buttock. 5. Secura cream to the surrounding skin on the left buttock [[employer]] 6. Again I have had the discussion with him about keeping his legs elevated as much as he can during the day. He has chronic venous hypertension he has had previous ablations leaving his legs dependent will result in edema in the lower legs and reopening of wounds on the left lateral leg and ankle and probably the right dorsal ankle as well Electronic Signature(s) Signed: 07/21/2020 4:22:50 PM By: Linton Ham MD Entered By: Linton Ham on 07/21/2020  08:56:53 -------------------------------------------------------------------------------- SuperBill Details Patient Name: Date of Service: [name], [name] 07/21/2020 Medical Record Number: [id number] Patient Account Number: [id number] Date of Birth/Sex: Treating RN: 09-19-1986 (34 y.o. 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 other part of right foot limited to breakdown of skin L97.521 Non-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, 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, complete Facility Procedures CPT4 Code: 84128208 Description: (Facility Use Only) [phone] - APPLY MULTLAY COMPRS LWR LT LEG Modifier: Quantity: 1 Physician Procedures : CPT4 Code Description Modifier [id number] - WC PHYS LEVEL 3 - EST PT ICD-10 Diagnosis Description L97.321 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 to breakdown of skin L97.321 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 to breakdown of  skin L97.311 Non-pressure chronic ulcer of right ankle limited to breakdown of skin L97.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

## 2020-07-21 NOTE — Progress Notes (Signed)
[name], [name] ([id number]) Visit Report for 07/21/2020 Arrival Information Details Patient Name: Date of Service: Divito, [name]. 07/21/2020 7:30 A M Medical Record Number: [id number] Patient Account Number: [id number] Date of Birth/Sex: Treating RN: 19-Mar-1986 (34 y.o. [name] Primary Care Merla Sawka: [name], [name] Other Clinician: Referring Jacob Chamblee: Treating Eulamae Greenstein/Extender: [name]: 22 Visit Information History Since Last Visit Added or deleted any medications: No Patient Arrived: Wheel Chair Any new allergies or adverse reactions: No Arrival Time: 07:48 Had a fall or experienced change in No Accompanied By: alone activities of daily living that may affect Transfer Assistance: None risk of falls: Patient Identification Verified: Yes Signs or symptoms of abuse/neglect since last visito No Secondary Verification Process Completed: Yes Hospitalized since last visit: No Patient Requires Transmission-Based Precautions: No Implantable device outside of the clinic excluding No Patient Has Alerts: Yes cellular tissue based products placed in the center Patient Alerts: R ABI = 1.0 since last visit: L ABI = 1.1 Has Dressing in Place as Prescribed: Yes Has Compression in Place as Prescribed: Yes Pain Present Now: No Electronic Signature(s) Signed: 07/21/2020 4:24:12 PM By: Levan Hurst RN, BSN Entered By: Levan Hurst on 07/21/2020 07:48:33 -------------------------------------------------------------------------------- Compression Therapy Details Patient Name: Date of Service: Kearl, [name] 07/21/2020 7:30 A M Medical Record Number: [id number] Patient Account Number: [id number] Date of Birth/Sex: Treating RN: [birth year] (34 y.o. [name] Primary Care Joanette Silveria: [place], [place] Other Clinician: Referring Jayel Inks: Treating Valerie Cones/Extender: [name]: 237 Compression Therapy  Performed for Wound Assessment: NonWound Condition Rash / Dermatitis - Left Leg Performed By: Clinician Levan Hurst, RN Compression Type: Four Layer Post Procedure Diagnosis Same as Pre-procedure Electronic Signature(s) Signed: 07/21/2020 4:24:12 PM By: Levan Hurst RN, BSN Entered By: Levan Hurst on 07/21/2020 08:29:35 -------------------------------------------------------------------------------- Encounter Discharge Information Details Patient Name: Date of Service: Hansmann, [name] 07/21/2020 7:30 A M Medical Record Number: [id number] Patient Account Number: [id number] Date of Birth/Sex: Treating RN: [birth year] (34 y.o. [name] Primary Care Albaro Deviney: Dana, Amasa Other Clinician: Referring Leelynn Whetsel: Treating Aman Batley/Extender: [name]: 237 Encounter Discharge Information Items Discharge Condition: Stable Ambulatory Status: Wheelchair Discharge Destination: Home Transportation: Other Accompanied By: self Schedule Follow-up Appointment: Yes Clinical Summary of Care: Patient Declined Electronic Signature(s) Signed: 07/21/2020 4:26:23 PM By: Kela Millin Entered By: Kela Millin on 07/21/2020 08:32:59 -------------------------------------------------------------------------------- Lower Extremity Assessment Details Patient Name: Date of Service: Gulyas, [name] 07/21/2020 7:30 A M Medical Record Number: [id number] Patient Account Number: [id number] Date of Birth/Sex: Treating RN: 12/17/1985 (34 y.o. [name] Primary Care Kaevion Sinclair: [place], [place] Other Clinician: Referring Mandalyn Pasqua: Treating Lathon Adan/Extender: [name]: 237 Edema Assessment Assessed: [Left: No] [Right: No] Edema: [Left: No] [Right: Yes] Calf Left: Right: Point of Measurement: 33 cm From Medial Instep 30 cm 33 cm Ankle Left: Right: Point of Measurement: 10 cm From Medial Instep 25.5 cm  23 cm Vascular Assessment Pulses: Dorsalis Pedis Palpable: [Left:Yes] [Right:Yes] Electronic Signature(s) Signed: 07/21/2020 4:24:12 PM By: Levan Hurst RN, BSN Entered By: Levan Hurst on 07/21/2020 08:00:05 -------------------------------------------------------------------------------- Multi Wound Chart Details Patient Name: Date of Service: Holzhauer, [name] 07/21/2020 7:30 A M Medical Record Number: [id number] Patient Account Number: [id number] Date of Birth/Sex: Treating RN: 1986/11/15 (34 y.o. [name] Primary Care Jil Penland: [name], [name] Other Clinician: Referring Johnnie Moten: Treating Anayah Arvanitis/Extender: [name]: 237 Vital Signs Height(in): 70 Pulse(bpm): 106  Weight(lbs): 216 Blood Pressure(mmHg): 139/72 Body Mass Index(BMI): 31 Temperature(F): 98.3 Respiratory Rate(breaths/min): 16 Photos: [38:No Photos Right T - Web between 1st and 2nd Left Ischium oe] [41R:No Photos] [42:No Photos Right, Dorsal Ankle] Wound Location: [38:Gradually Appeared] [41R:Gradually Appeared] [42:Gradually Appeared] Wounding Event: [38:Inflammatory] [41R:Pressure Ulcer] [42:Venous Leg Ulcer] Primary Etiology: [38:Sleep Apnea, Hypertension, Paraplegia Sleep Apnea, Hypertension, Paraplegia Sleep Apnea, Hypertension, Paraplegia] Comorbid History: [38:11/30/2019] [41R:03/16/2020] [42:06/06/2020] Date Acquired: [38:33] [41R:18] [42:6] Weeks of Treatment: [38:Open] [41R:Open] [42:Healed - Epithelialized] Wound Status: [38:No] [41R:Yes] [42:No] Wound Recurrence: [38:0.2x0.2x0.1] [41R:0.5x0.5x0.1] [42:0x0x0] Measurements L x W x D (cm) [38:0.031] [41R:0.196] [42:0] A (cm) : rea [40:1.027] [41R:0.02] [42:0] Volume (cm) : [38:90.60%] [41R:89.10%] [42:100.00%] % Reduction in A rea: [38:98.70%] [41R:89.00%] [42:100.00%] % Reduction in Volume: [38:Full Thickness Without Exposed] [41R:Category/Stage II] [42:Full Thickness Without Exposed] Classification:  [38:Support Structures Medium] [41R:Medium] [42:Support Structures None Present] Exudate Amount: [38:Serosanguineous] [41R:Serosanguineous] [42:N/A] Exudate Type: [38:red, brown] [41R:red, brown] [42:N/A] Exudate Color: [38:Distinct, outline attached] [41R:Distinct, outline attached] [42:Flat and Intact] Wound Margin: [38:Large (67-100%)] [41R:Large (67-100%)] [42:None Present (0%)] Granulation Amount: [38:Pink] [41R:Pink] [42:N/A] Granulation Quality: [38:None Present (0%)] [41R:None Present (0%)] [42:None Present (0%)] Necrotic Amount: [38:Fat Layer (Subcutaneous Tissue): Yes Fat Layer (Subcutaneous Tissue): Yes Fascia: No] Exposed Structures: [38:Fascia: No Tendon: No Muscle: No Joint: No Bone: No Large (67-100%)] [41R:Fascia: No Tendon: No Muscle: No Joint: No Bone: No Large (67-100%)] [42:Fat Layer (Subcutaneous Tissue): No Tendon: No Muscle: No Joint: No Bone: No Large (67-100%)] Wound Number: [id number] N/A Photos: No Photos No Photos N/A Left, Medial Foot Left, Lateral Foot N/A Wound Location: Gradually Appeared Gradually Appeared N/A Wounding Event: Inflammatory Inflammatory N/A Primary Etiology: Sleep Apnea, Hypertension, Paraplegia Sleep Apnea, Hypertension, Paraplegia N/A Comorbid History: 07/10/2020 07/14/2020 N/A Date Acquired: 1 1 N/A Weeks of Treatment: Healed - Epithelialized Healed - Epithelialized N/A Wound Status: No No N/A Wound Recurrence: 0x0x0 0x0x0 N/A Measurements L x W x D (cm) 0 0 N/A A (cm) : rea 0 0 N/A Volume (cm) : 100.00% 100.00% N/A % Reduction in A rea: 100.00% 100.00% N/A % Reduction in Volume: Full Thickness Without Exposed Full Thickness Without Exposed N/A Classification: Support Structures Support Structures None Present None Present N/A Exudate Amount: N/A N/A N/A Exudate Type: N/A N/A N/A Exudate Color: Distinct, outline attached Distinct, outline attached N/A Wound Margin: None Present (0%) None Present (0%) N/A Granulation  Amount: N/A N/A N/A Granulation Quality: None Present (0%) None Present (0%) N/A Necrotic Amount: Fascia: No Fascia: No N/A Exposed Structures: Fat Layer (Subcutaneous Tissue): No Fat Layer (Subcutaneous Tissue): No Tendon: No Tendon: No Muscle: No Muscle: No Joint: No Joint: No Bone: No Bone: No Large (67-100%) Large (67-100%) N/A Epithelialization: Treatment Notes Wound #38 (Right Toe - Web between 1st and 2nd) 1. Cleanse With Wound Cleanser 3. Primary Dressing Applied Calcium Alginate Ag 4. Secondary Dressing Dry Gauze Roll Gauze 5. Secured With Tape Notes juxtalite Wound #41R (Left Ischium) 1. Cleanse With Wound Cleanser Soap and water 3. Primary Dressing Applied Calcium Alginate Ag 4. Secondary Dressing Foam Border Dressing Electronic Signature(s) Signed: 07/21/2020 4:22:50 PM By: Linton Ham MD Signed: 07/21/2020 4:24:12 PM By: Levan Hurst RN, BSN Entered By: Linton Ham on 07/21/2020 08:50:39 -------------------------------------------------------------------------------- Multi-Disciplinary Care Plan Details Patient Name: Date of Service: Welch, [name] 07/21/2020 7:30 A M Medical Record Number: [id number] Patient Account Number: [id number] Date of Birth/Sex: Treating RN: [birth year] (34 y.o. [name] Primary Care Tina Temme: [place], [place] Other Clinician: Referring Elizibeth Breau: Treating Thomasina Housley/Extender: Malachi Carl  [name]: 237 Active Inactive Wound/Skin Impairment Nursing Diagnoses: Impaired tissue integrity Knowledge deficit related to ulceration/compromised skin integrity Goals: Patient/caregiver will verbalize understanding of skin care regimen Date Initiated: 01/05/2016 Target Resolution Date: 08/08/2020 Goal Status: Active Ulcer/skin breakdown will have a volume reduction of 30% by week 4 Date Initiated: 01/05/2016 Date Inactivated: 12/22/2017 Target Resolution Date: 01/19/2018 Unmet Reason:  complex wounds, Goal Status: Unmet infection Interventions: Assess patient/caregiver ability to obtain necessary supplies Assess ulceration(s) every visit Provide education on ulcer and skin care Notes: Electronic Signature(s) Signed: 07/21/2020 4:24:12 PM By: Levan Hurst RN, BSN Entered By: Levan Hurst on 07/21/2020 08:15:21 -------------------------------------------------------------------------------- Pain Assessment Details Patient Name: Date of Service: Gottschall, [name] 07/21/2020 7:30 A M Medical Record Number: [id number] Patient Account Number: [id number] Date of Birth/Sex: Treating RN: [birth year] (34 y.o. [name] Primary Care Kristin Lamagna: [place], [place] Other Clinician: Referring Gabriel Conry: Treating Calen Posch/Extender: [name]: 237 Active Problems Location of Pain Severity and Description of Pain Patient Has Paino No Site Locations Pain Management and Medication Current Pain Management: Electronic Signature(s) Signed: 07/21/2020 4:24:12 PM By: Levan Hurst RN, BSN Entered By: Levan Hurst on 07/21/2020 07:48:57 -------------------------------------------------------------------------------- Patient/Caregiver Education Details Patient Name: Date of Service: Mccleese, A Viviann Spare 8/30/2021andnbsp7:30 A M Medical Record Number: [id number] Patient Account Number: [id number] Date of Birth/Gender: Treating RN: 1986-03-21 (34 y.o. [name] Primary Care Physician: Janine Limbo Other Clinician: Referring Physician: Treating Physician/Extender: [name]: 40 Education Assessment Education Provided To: Patient Education Topics Provided Wound/Skin Impairment: Methods: Explain/Verbal Responses: State content correctly [employer]) Signed: 07/21/2020 4:24:12 PM By: Levan Hurst RN, BSN Entered By: Levan Hurst on 07/21/2020  08:15:47 -------------------------------------------------------------------------------- Wound Assessment Details Patient Name: Date of Service: Silverman, [name] 07/21/2020 7:30 A M Medical Record Number: [id number] Patient Account Number: [id number] Date of Birth/Sex: Treating RN: 09-Jun-1986 (34 y.o. [name] Primary Care Lamine Laton: [name], [name] Other Clinician: Referring Dahiana Kulak: Treating Olegario Emberson/Extender: [name]: 237 Wound Status Wound Number: [id number] Primary Etiology: Inflammatory Wound Location: Right T - Web between 1st and 2nd oe Wound Status: Open Wounding Event: Gradually Appeared Comorbid History: Sleep Apnea, Hypertension, Paraplegia Date Acquired: 11/30/2019 Weeks Of Treatment: 33 Clustered Wound: No Wound Measurements Length: (cm) 0.2 Width: (cm) 0.2 Depth: (cm) 0.1 Area: (cm) 0.031 Volume: (cm) 0.003 % Reduction in Area: 90.6% % Reduction in Volume: 98.7% Epithelialization: Large (67-100%) Tunneling: No Undermining: No Wound Description Classification: Full Thickness Without Exposed Support Structures Wound Margin: Distinct, outline attached Exudate Amount: Medium Exudate Type: Serosanguineous Exudate Color: red, brown Foul Odor After Cleansing: No Slough/Fibrino No Wound Bed Granulation Amount: Large (67-100%) Exposed Structure Granulation Quality: Pink Fascia Exposed: No Necrotic Amount: None Present (0%) Fat Layer (Subcutaneous Tissue) Exposed: Yes Tendon Exposed: No Muscle Exposed: No Joint Exposed: No Bone Exposed: No Treatment Notes Wound #38 (Right Toe - Web between 1st and 2nd) 1. Cleanse With Wound Cleanser 3. Primary Dressing Applied Calcium Alginate Ag 4. Secondary Dressing Dry Gauze Roll Gauze 5. Secured With Tape Notes Development worker, international aid) Signed: 07/21/2020 4:24:12 PM By: Levan Hurst RN, BSN Entered By: Levan Hurst on 07/21/2020  08:02:14 -------------------------------------------------------------------------------- Wound Assessment Details Patient Name: Date of Service: Kovacevic, [name] 07/21/2020 7:30 A M Medical Record Number: [id number] Patient Account Number: [id number] Date of Birth/Sex: Treating RN: 1986/06/20 (34 y.o. [name] Primary Care Leticia Mcdiarmid: [place], [place] Other Clinician: Referring Kanesha Cadle: Treating Jaelynn Currier/Extender: Malachi Carl  [name]: 237 Wound Status Wound Number: [id number] Primary Etiology: Pressure Ulcer Wound Location: Left Ischium Wound Status: Open Wounding Event: Gradually Appeared Comorbid History: Sleep Apnea, Hypertension, Paraplegia Date Acquired: 03/16/2020 Weeks Of Treatment: 18 Clustered Wound: No Wound Measurements Length: (cm) 0.5 Width: (cm) 0.5 Depth: (cm) 0.1 Area: (cm) 0.196 Volume: (cm) 0.02 % Reduction in Area: 89.1% % Reduction in Volume: 89% Epithelialization: Large (67-100%) Tunneling: No Undermining: No Wound Description Classification: Category/Stage II Wound Margin: Distinct, outline attached Exudate Amount: Medium Exudate Type: Serosanguineous Exudate Color: red, brown Foul Odor After Cleansing: No Slough/Fibrino No Wound Bed Granulation Amount: Large (67-100%) Exposed Structure Granulation Quality: Pink Fascia Exposed: No Necrotic Amount: None Present (0%) Fat Layer (Subcutaneous Tissue) Exposed: Yes Tendon Exposed: No Muscle Exposed: No Joint Exposed: No Bone Exposed: No Treatment Notes Wound #41R (Left Ischium) 1. Cleanse With Wound Cleanser Soap and water 3. Primary Dressing Applied Calcium Alginate Ag 4. Secondary Dressing Foam Border Dressing Electronic Signature(s) Signed: 07/21/2020 4:24:12 PM By: Levan Hurst RN, BSN Entered By: Levan Hurst on 07/21/2020 08:03:05 -------------------------------------------------------------------------------- Wound Assessment Details Patient  Name: Date of Service: Zavalza, [name] 07/21/2020 7:30 A M Medical Record Number: [id number] Patient Account Number: [id number] Date of Birth/Sex: Treating RN: [birth year] (34 y.o. [name] Primary Care Synai Prettyman: [name], [name] Other Clinician: Referring Renato Spellman: Treating Alvira Hecht/Extender: [name]: 237 Wound Status Wound Number: [id number] Primary Etiology: Venous Leg Ulcer Wound Location: Right, Dorsal Ankle Wound Status: Healed - Epithelialized Wounding Event: Gradually Appeared Comorbid History: Sleep Apnea, Hypertension, Paraplegia Date Acquired: 06/06/2020 Weeks Of Treatment: 6 Clustered Wound: No Wound Measurements Length: (cm) Width: (cm) Depth: (cm) Area: (cm) Volume: (cm) 0 % Reduction in Area: 100% 0 % Reduction in Volume: 100% 0 Epithelialization: Large (67-100%) 0 Tunneling: No 0 Undermining: No Wound Description Classification: Full Thickness Without Exposed Support Structures Wound Margin: Flat and Intact Exudate Amount: None Present Foul Odor After Cleansing: No Slough/Fibrino No Wound Bed Granulation Amount: None Present (0%) Exposed Structure Necrotic Amount: None Present (0%) Fascia Exposed: No Fat Layer (Subcutaneous Tissue) Exposed: No Tendon Exposed: No Muscle Exposed: No Joint Exposed: No Bone Exposed: No Electronic Signature(s) Signed: 07/21/2020 4:24:12 PM By: Levan Hurst RN, BSN Entered By: Levan Hurst on 07/21/2020 08:03:25 -------------------------------------------------------------------------------- Wound Assessment Details Patient Name: Date of Service: Raybon, [name] 07/21/2020 7:30 A M Medical Record Number: [id number] Patient Account Number: [id number] Date of Birth/Sex: Treating RN: [birth year] (34 y.o. [name] Primary Care Taryn Nave: [name], [name] Other Clinician: Referring Jaevian Shean: Treating Kaelynne Christley/Extender: [name]:  237 Wound Status Wound Number: [id number] Primary Etiology: Inflammatory Wound Location: Left, Medial Foot Wound Status: Healed - Epithelialized Wounding Event: Gradually Appeared Comorbid History: Sleep Apnea, Hypertension, Paraplegia Date Acquired: 07/10/2020 Weeks Of Treatment: 1 Clustered Wound: No Wound Measurements Length: (cm) Width: (cm) Depth: (cm) Area: (cm) Volume: (cm) 0 % Reduction in Area: 100% 0 % Reduction in Volume: 100% 0 Epithelialization: Large (67-100%) 0 Tunneling: No 0 Undermining: No Wound Description Classification: Full Thickness Without Exposed Support Structures Wound Margin: Distinct, outline attached Exudate Amount: None Present Foul Odor After Cleansing: No Slough/Fibrino No Wound Bed Granulation Amount: None Present (0%) Exposed Structure Necrotic Amount: None Present (0%) Fascia Exposed: No Fat Layer (Subcutaneous Tissue) Exposed: No Tendon Exposed: No Muscle Exposed: No Joint Exposed: No Bone Exposed: No Electronic Signature(s) Signed: 07/21/2020 4:24:12 PM By: Levan Hurst RN, BSN Entered By: Levan Hurst on 07/21/2020 08:03:48 -------------------------------------------------------------------------------- Wound Assessment  Details Patient Name: Date of Service: Cannan, [name] 07/21/2020 7:30 A M Medical Record Number: [id number] Patient Account Number: [id number] Date of Birth/Sex: Treating RN: May 11, 1986 (34 y.o. [name] Primary Care Axie Hayne: [name], [name] Other Clinician: Referring Jaxten Brosh: Treating Khadejah Son/Extender: [name]: 237 Wound Status Wound Number: 33 Primary Etiology: Inflammatory Wound Location: Left, Lateral Foot Wound Status: Healed - Epithelialized Wounding Event: Gradually Appeared Comorbid History: Sleep Apnea, Hypertension, Paraplegia Date Acquired: 07/14/2020 Weeks Of Treatment: 1 Clustered Wound: No Wound Measurements Length: (cm) Width:  (cm) Depth: (cm) Area: (cm) Volume: (cm) 0 % Reduction in Area: 100% 0 % Reduction in Volume: 100% 0 Epithelialization: Large (67-100%) 0 Tunneling: No 0 Undermining: No Wound Description Classification: Full Thickness Without Exposed Support Structures Wound Margin: Distinct, outline attached Exudate Amount: None Present Foul Odor After Cleansing: No Slough/Fibrino No Wound Bed Granulation Amount: None Present (0%) Exposed Structure Necrotic Amount: None Present (0%) Fascia Exposed: No Fat Layer (Subcutaneous Tissue) Exposed: No Tendon Exposed: No Muscle Exposed: No Joint Exposed: No Bone Exposed: No Electronic Signature(s) Signed: 07/21/2020 4:24:12 PM By: Levan Hurst RN, BSN Entered By: Levan Hurst on 07/21/2020 08:04:08 -------------------------------------------------------------------------------- [place] Details Patient Name: Date of Service: Vroom, [name] 07/21/2020 7:30 A M Medical Record Number: [id number] Patient Account Number: [id number] Date of Birth/Sex: Treating RN: 05-Aug-1986 (34 y.o. [name] Primary Care Cranston Koors: [place], [place] Other Clinician: Referring Brandyn Thien: Treating Aladdin Kollmann/Extender: [name]: 237 Vital Signs Time Taken: 07:48 Temperature (F): 98.3 Height (in): 70 Pulse (bpm): 106 Weight (lbs): 216 Respiratory Rate (breaths/min): 16 Body Mass Index (BMI): 31 Blood Pressure (mmHg): 139/72 Reference Range: 80 - 120 mg / dl Electronic Signature(s) Signed: 07/21/2020 4:24:12 PM By: Levan Hurst RN, BSN Entered By: Levan Hurst on 07/21/2020 07:48:52

## 2020-07-25 DIAGNOSIS — M62838 Other muscle spasm: Secondary | ICD-10-CM | POA: Diagnosis not present

## 2020-07-25 DIAGNOSIS — R350 Frequency of micturition: Secondary | ICD-10-CM | POA: Diagnosis not present

## 2020-07-25 DIAGNOSIS — R635 Abnormal weight gain: Secondary | ICD-10-CM | POA: Diagnosis not present

## 2020-07-25 DIAGNOSIS — I10 Essential (primary) hypertension: Secondary | ICD-10-CM | POA: Diagnosis not present

## 2020-07-29 ENCOUNTER — Encounter (HOSPITAL_BASED_OUTPATIENT_CLINIC_OR_DEPARTMENT_OTHER): Payer: BC Managed Care – PPO | Attending: Internal Medicine | Admitting: Internal Medicine

## 2020-07-29 DIAGNOSIS — Z9081 Acquired absence of spleen: Secondary | ICD-10-CM | POA: Diagnosis not present

## 2020-07-29 DIAGNOSIS — L97511 Non-pressure chronic ulcer of other part of right foot limited to breakdown of skin: Secondary | ICD-10-CM | POA: Diagnosis not present

## 2020-07-29 DIAGNOSIS — L97321 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 to breakdown of skin: Secondary | ICD-10-CM | POA: Insufficient documentation

## 2020-07-29 DIAGNOSIS — Z89422 Acquired absence of other left toe(s): Secondary | ICD-10-CM | POA: Insufficient documentation

## 2020-07-29 DIAGNOSIS — L89323 Pressure ulcer of left buttock, stage 3: Secondary | ICD-10-CM | POA: Insufficient documentation

## 2020-07-29 DIAGNOSIS — Z8614 Personal history of Methicillin resistant Staphylococcus aureus infection: Secondary | ICD-10-CM | POA: Diagnosis not present

## 2020-07-29 DIAGNOSIS — I87332 Chronic venous hypertension (idiopathic) with ulcer and inflammation of left lower extremity: Secondary | ICD-10-CM | POA: Diagnosis not present

## 2020-07-29 DIAGNOSIS — L97311 Non-pressure chronic ulcer of right ankle limited to breakdown of skin: Secondary | ICD-10-CM | POA: Diagnosis not present

## 2020-07-29 DIAGNOSIS — I872 Venous insufficiency (chronic) (peripheral): Secondary | ICD-10-CM | POA: Diagnosis not present

## 2020-07-29 DIAGNOSIS — G8221 Paraplegia, complete: Secondary | ICD-10-CM | POA: Insufficient documentation

## 2020-07-29 DIAGNOSIS — I10 Essential (primary) hypertension: Secondary | ICD-10-CM | POA: Insufficient documentation

## 2020-07-29 DIAGNOSIS — Z87440 Personal history of urinary (tract) infections: Secondary | ICD-10-CM | POA: Diagnosis not present

## 2020-07-29 DIAGNOSIS — G4730 Sleep apnea, unspecified: Secondary | ICD-10-CM | POA: Insufficient documentation

## 2020-07-29 DIAGNOSIS — L97521 Non-pressure chronic ulcer of other part of left foot limited to breakdown of skin: Secondary | ICD-10-CM | POA: Diagnosis not present

## 2020-07-29 NOTE — Progress Notes (Signed)
[name], [name] ([id number]) Visit Report for 07/29/2020 Arrival Information Details Patient Name: Date of Service: [name], [name] 07/29/2020 7:30 [name] Medical Record Number: [id number] Patient Account Number: [id number] Date of Birth/Sex: Treating RN: 07-Dec-1985 (34 y.o. [name] Primary Care Adaeze Better: [name], [name] Other Clinician: Referring Macarius Ruark: Treating Salinda Snedeker/Extender: [name]: 81 Visit Information History Since Last Visit Added or deleted any medications: No Patient Arrived: Wheel Chair Any new allergies or adverse reactions: No Arrival Time: 07:39 Had [name] fall or experienced change in No Accompanied By: self activities of daily living that may affect Transfer Assistance: None risk of falls: Patient Identification Verified: Yes Signs or symptoms of abuse/neglect since last visito No Secondary Verification Process Completed: Yes Hospitalized since last visit: No Patient Requires Transmission-Based Precautions: No Implantable device outside of the clinic excluding No Patient Has Alerts: Yes cellular tissue based products placed in the center Patient Alerts: R ABI = 1.0 since last visit: L ABI = 1.1 Has Dressing in Place as Prescribed: Yes Has Compression in Place as Prescribed: Yes Pain Present Now: No Electronic Signature(s) Signed: 07/29/2020 5:29:26 PM By: Baruch Gouty RN, BSN Entered By: Baruch Gouty on 07/29/2020 07:39:50 -------------------------------------------------------------------------------- Compression Therapy Details Patient Name: Date of Service: [name], [name] 07/29/2020 7:30 [name] Medical Record Number: [id number] Patient Account Number: [id number] Date of Birth/Sex: Treating RN: 03-15-1986 (34 y.o. [name] Primary Care Kwana Ringel: [place], [place] Other Clinician: Referring Nakari Bracknell: Treating Kirsti Mcalpine/Extender: [name]: 238 Compression Therapy  Performed for Wound Assessment: NonWound Condition Rash / Dermatitis - Left Leg Performed By: Clinician Baruch Gouty, RN Compression Type: Four Layer Electronic Signature(s) Signed: 07/29/2020 5:29:26 PM By: Baruch Gouty RN, BSN Entered By: Baruch Gouty on 07/29/2020 08:12:24 -------------------------------------------------------------------------------- Encounter Discharge Information Details Patient Name: Date of Service: [name], [name] 07/29/2020 7:30 [name] Medical Record Number: [id number] Patient Account Number: [id number] Date of Birth/Sex: Treating RN: 1986-02-13 (34 y.o. [name] Primary Care Greer Koeppen: [place], [place] Other Clinician: Referring Dolan Xia: Treating Tariah Transue/Extender: [name]: 238 Encounter Discharge Information Items Discharge Condition: Stable Ambulatory Status: Wheelchair Discharge Destination: Home Transportation: Private Auto Accompanied By: self Schedule Follow-up Appointment: Yes Clinical Summary of Care: Patient Declined Electronic Signature(s) Signed: 07/29/2020 5:29:26 PM By: Baruch Gouty RN, BSN Entered By: Baruch Gouty on 07/29/2020 08:15:52 -------------------------------------------------------------------------------- Patient/Caregiver Education Details Patient Name: Date of Service: [name], [name] 9/7/2021andnbsp7:30 [name] Medical Record Number: [id number] Patient Account Number: [id number] Date of Birth/Gender: Treating RN: 1986-11-22 (35 y.o. [name] Primary Care Physician: Janine Limbo Other Clinician: Referring Physician: Treating Physician/Extender: [name]: 87 Education Assessment Education Provided To: Patient Education Topics Provided Pressure: Methods: Explain/Verbal Responses: Reinforcements needed, State content correctly Venous: Methods: Explain/Verbal Responses: Reinforcements needed, State content  correctly Wound/Skin Impairment: Methods: Explain/Verbal Responses: Reinforcements needed, State content correctly Electronic Signature(s) Signed: 07/29/2020 5:29:26 PM By: Baruch Gouty RN, BSN Entered By: Baruch Gouty on 07/29/2020 08:15:36 -------------------------------------------------------------------------------- Wound Assessment Details Patient Name: Date of Service: [name], [name] 07/29/2020 7:30 [name] Medical Record Number: [id number] Patient Account Number: [id number] Date of Birth/Sex: Treating RN: 08-09-1986 (34 y.o. [name] Primary Care Virgia Kelner: [name], [name] Other Clinician: Referring Xai Frerking: Treating Lasharon Dunivan/Extender: [name]: 238 Wound Status Wound Number: [id number] Primary Etiology: Inflammatory Wound Location: Right T - Web between 1st and 2nd oe Wound Status: Open Wounding Event: Gradually  Appeared Comorbid History: Sleep Apnea, Hypertension, Paraplegia Date Acquired: 11/30/2019 Weeks Of Treatment: 34 Clustered Wound: No Wound Measurements Length: (cm) 0.2 Width: (cm) 0.2 Depth: (cm) 0.1 Area: (cm) 0.031 Volume: (cm) 0.003 % Reduction in Area: 90.6% % Reduction in Volume: 98.7% Epithelialization: Large (67-100%) Tunneling: No Undermining: No Wound Description Classification: Full Thickness Without Exposed Support Structures Wound Margin: Distinct, outline attached Exudate Amount: Small Exudate Type: Serosanguineous Exudate Color: red, brown Foul Odor After Cleansing: No Slough/Fibrino No Wound Bed Granulation Amount: Large (67-100%) Exposed Structure Granulation Quality: Pink Fascia Exposed: No Necrotic Amount: None Present (0%) Fat Layer (Subcutaneous Tissue) Exposed: Yes Tendon Exposed: No Muscle Exposed: No Joint Exposed: No Bone Exposed: No Treatment Notes Wound #38 (Right Toe - Web between 1st and 2nd) 2. Periwound Care Moisturizing lotion 3. Primary Dressing Applied Calcium  Alginate Ag 4. Secondary Dressing Dry Gauze Roll Gauze Notes juxtalite Electronic Signature(s) Signed: 07/29/2020 5:29:26 PM By: Baruch Gouty RN, BSN Entered By: Baruch Gouty on 07/29/2020 07:55:43 -------------------------------------------------------------------------------- Wound Assessment Details Patient Name: Date of Service: [name], [name] 07/29/2020 7:30 [name] Medical Record Number: [id number] Patient Account Number: [id number] Date of Birth/Sex: Treating RN: [birth year] (34 y.o. [name] Primary Care Lexie Koehl: [name], [name] Other Clinician: Referring Jerritt Cardoza: Treating Dell Briner/Extender: [name]: 238 Wound Status Wound Number: [id number] Primary Etiology: Pressure Ulcer Wound Location: Left Ischium Wound Status: Open Wounding Event: Gradually Appeared Comorbid History: Sleep Apnea, Hypertension, Paraplegia Date Acquired: 03/16/2020 Weeks Of Treatment: 19 Clustered Wound: No Wound Measurements Length: (cm) 0.5 Width: (cm) 0.5 Depth: (cm) 0.1 Area: (cm) 0.196 Volume: (cm) 0.02 % Reduction in Area: 89.1% % Reduction in Volume: 89% Epithelialization: Large (67-100%) Tunneling: No Undermining: No Wound Description Classification: Category/Stage II Wound Margin: Distinct, outline attached Exudate Amount: None Present Foul Odor After Cleansing: No Slough/Fibrino No Wound Bed Granulation Amount: None Present (0%) Exposed Structure Necrotic Amount: None Present (0%) Fascia Exposed: No Fat Layer (Subcutaneous Tissue) Exposed: No Tendon Exposed: No Muscle Exposed: No Joint Exposed: No Bone Exposed: No Limited to Skin Breakdown Treatment Notes Wound #[id number] (Left Ischium) 4. Secondary Dressing Foam Border Dressing Electronic Signature(s) Signed: 07/29/2020 5:29:26 PM By: Baruch Gouty RN, BSN Entered By: Baruch Gouty on 07/29/2020  07:56:31 -------------------------------------------------------------------------------- [place] Details Patient Name: Date of Service: [name], [name] 07/29/2020 7:30 [name] Medical Record Number: [id number] Patient Account Number: [id number] Date of Birth/Sex: Treating RN: May 04, 1986 (34 y.o. [name] Primary Care Rashon Rezek: [place], [place] Other Clinician: Referring Nikolus [name]: Treating Stevens Magwood/Extender: [name]: 238 Vital Signs Time Taken: 07:39 Temperature (F): 98.4 Height (in): 70 Pulse (bpm): 116 Source: Stated Respiratory Rate (breaths/min): 18 Weight (lbs): 216 Blood Pressure (mmHg): 127/81 Source: Stated Reference Range: 80 - 120 mg / dl Body Mass Index (BMI): 31 Electronic Signature(s) Signed: 07/29/2020 5:29:26 PM By: Baruch Gouty RN, BSN Entered By: Baruch Gouty on 07/29/2020 07:41:45

## 2020-07-29 NOTE — Progress Notes (Signed)
[name], [name] ([id number]) Visit Report for 07/29/2020 SuperBill Details Patient Name: Date of Service: [name], [name] 07/29/2020 Medical Record Number: [id number] Patient Account Number: [id number] Date of Birth/Sex: Treating RN: [birth year] (34 y.o. 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 L97.311 Non-pressure chronic ulcer of right ankle limited to breakdown of skin L97.511 Non-pressure chronic ulcer of other part of right foot limited to breakdown of skin L97.521 Non-pressure chronic ulcer of other part of left foot limited to breakdown of 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

## 2020-08-04 ENCOUNTER — Other Ambulatory Visit: Payer: Self-pay

## 2020-08-04 ENCOUNTER — Encounter (HOSPITAL_BASED_OUTPATIENT_CLINIC_OR_DEPARTMENT_OTHER): Payer: BC Managed Care – PPO | Admitting: Internal Medicine

## 2020-08-04 DIAGNOSIS — L97311 Non-pressure chronic ulcer of right ankle limited to breakdown of skin: Secondary | ICD-10-CM | POA: Diagnosis not present

## 2020-08-04 DIAGNOSIS — L97521 Non-pressure chronic ulcer of other part of left foot limited to breakdown of skin: Secondary | ICD-10-CM | POA: Diagnosis not present

## 2020-08-04 DIAGNOSIS — Z87440 Personal history of urinary (tract) infections: Secondary | ICD-10-CM | POA: Diagnosis not present

## 2020-08-04 DIAGNOSIS — L97321 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 to breakdown of skin: Secondary | ICD-10-CM | POA: Diagnosis not present

## 2020-08-04 DIAGNOSIS — L97511 Non-pressure chronic ulcer of other part of right foot limited to breakdown of skin: Secondary | ICD-10-CM | POA: Diagnosis not present

## 2020-08-04 DIAGNOSIS — I10 Essential (primary) hypertension: Secondary | ICD-10-CM | POA: Diagnosis not present

## 2020-08-04 DIAGNOSIS — G4730 Sleep apnea, unspecified: Secondary | ICD-10-CM | POA: Diagnosis not present

## 2020-08-04 DIAGNOSIS — L89323 Pressure ulcer of left buttock, stage 3: Secondary | ICD-10-CM | POA: Diagnosis not present

## 2020-08-04 DIAGNOSIS — I872 Venous insufficiency (chronic) (peripheral): Secondary | ICD-10-CM | POA: Diagnosis not present

## 2020-08-04 DIAGNOSIS — Z8614 Personal history of Methicillin resistant Staphylococcus aureus infection: Secondary | ICD-10-CM | POA: Diagnosis not present

## 2020-08-04 DIAGNOSIS — Z89422 Acquired absence of other left toe(s): Secondary | ICD-10-CM | POA: Diagnosis not present

## 2020-08-04 DIAGNOSIS — Z9081 Acquired absence of spleen: Secondary | ICD-10-CM | POA: Diagnosis not present

## 2020-08-04 DIAGNOSIS — I87332 Chronic venous hypertension (idiopathic) with ulcer and inflammation of left lower extremity: Secondary | ICD-10-CM | POA: Diagnosis not present

## 2020-08-04 DIAGNOSIS — I87322 Chronic venous hypertension (idiopathic) with inflammation of left lower extremity: Secondary | ICD-10-CM | POA: Diagnosis not present

## 2020-08-04 DIAGNOSIS — G8221 Paraplegia, complete: Secondary | ICD-10-CM | POA: Diagnosis not present

## 2020-08-06 NOTE — Progress Notes (Signed)
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 aggressively for this although we have not made any improvement here. 08/11/17; left lateral leg. Surface continues to improve with the Iodoflex although we are not seeing much improvement in overall wound dimensions. Areas on his plantar left foot and right foot show no improvement. In fact the right foot looks somewhat worse 08/18/17; left lateral leg. We changed to [facility] Blue last week after a prolonged course of Iodoflex which helps get the surface better. It appears that the wound with is improved. Continue with difficult areas on the left dorsal first second and plantar first second on the right 09/01/17; patient  arrives in clinic today having had a temperature of 103 yesterday. He was seen in the ER and [facility]. The patient was concerned he could have cellulitis again in the right leg however they diagnosed him with a UTI and he is now on Keflex. He has a history of cellulitis which is been recurrent and difficult but this is been in the left leg, in the past 5 use doxycycline. He does in and out catheterizations at home which are risk factors for UTI 09/08/17; patient will be completing his Keflex this weekend. The erythema on the left leg is considerably better. He has a new wound today on the medial  part of the right leg small superficial almost looks like a skin tear. He has worsening of the area on the right dorsal first and second toe. His major area on the left lateral leg is better. Using Hydrofera Blue on all areas 09/15/17; gradual reduction in width on the long wound in the left lateral leg. No debridement required. He also has wounds on the plantar aspect of his left first second toe web space and on the dorsal aspect of the right first second toe web space. 09/22/17; there continues to be very gradual improvements in the dimensions of the left lateral leg wound. He hasn't round erythematous spot with might be pressure on his wheelchair. There is no evidence obviously of infection no purulence no warmth He has a dry scaled area on the plantar aspect of the left first second toe Improved area on the dorsal right first second toe. 09/29/17; left lateral leg wound continues to improve in dimensions mostly with an is still a fairly long but increasingly narrow wound. He has a dry scaled area on the plantar aspect of his left first second toe web space Increasingly concerning area on the dorsal right first second toe. In fact I am concerned today about possible cellulitis around this wound. The areas extending up his second toe and although there is deformities here almost appears to abut on the nailbed. 10/06/17; left lateral leg wound continues to make very gradual progress. Tissue culture I did from the right first second toe dorsal foot last time grew MRSA and enterococcus which was vancomycin sensitive. This was not sensitive to clindamycin or doxycycline. He is allergic to Zyvox and sulfa we have therefore arrange for him to have dalvance infusion tomorrow. He is had this in the past and tolerated it well 10/20/17; left lateral leg wound continues to make decent progress. This is certainly reduced in terms of with there is advancing epithelialization.The cellulitis in the right foot  looks better although he still has a deep wound in the dorsal aspect of the first second toe web space. Plantar left first toe web space on the left I think is making some progress 10/27/17; left lateral leg wound continues to make decent progress. Advancing epithelialization.using Hydrofera Blue The right first second toe web space wound is better-looking using silver alginate Improvement in the left plantar first second toe web space. Again using silver alginate 11/03/17 left lateral leg wound continues to make decent progress albeit slowly. Using [facility] The right per second toe web space continues to be a very problematic looking punched out wound. I obtained a piece of tissue for deep culture I did extensively treated this for fungus. It is difficult to imagine that this is a pressure area as the patient states other than going outside he doesn't really wear shoes at home The left plantar  first second toe web space looked fairly senescent. Necrotic edges. This required debridement change to [facility] Blue to all wound areas 11/10/17; left lateral leg wound continues to contract. Using Hydrofera Blue On the right dorsal first second toe web space dorsally. Culture I did of this area last week grew MRSA there is not an easy oral option in this patient was multiple antibiotic allergies or intolerances. This was only a rare culture isolate I'm therefore going to use Bactroban under silver alginate On the left plantar first second toe web space. Debridement is required here. This is also unchanged 11/17/17; left lateral leg wound continues to contract using Hydrofera Blue this is no longer the major issue. The major concern here is the right first second toe web space. He now has an open area going from dorsally to the plantar aspect. There is now wound on the inner lateral part of the first toe. Not a very viable surface on this. There is erythema spreading medially into the forefoot. No  major change in the left first second toe plantar wound 11/24/17; left lateral leg wound continues to contract using Hydrofera Blue. Nice improvement today The right first second toe web space all of this looks a lot less angry than last week. I have given him clindamycin and topical Bactroban for MRSA and terbinafine for the possibility of underlining tinea pedis that I could not control with ketoconazole. Looks somewhat better The area on the plantar left first second toe web space is weeping with dried debris around the wound 12/01/17; left lateral leg wound continues to contract he Hydrofera Blue. It is becoming thinner in terms of with nevertheless it is making good improvement. The right first second toe web space looks less angry but still a large necrotic-looking wounds starting on the plantar aspect of the right foot extending between the toes and now extensively on the base of the right second toe. I gave him clindamycin and topical Bactroban for MRSA anterior benefiting for the possibility of underlying tinea pedis. Not looking better today The area on the left first/second toe looks better. Debrided of necrotic debris 12/05/17* the patient was worked in urgently today because over the weekend he found blood on his incontinence bad when he woke up. He was found to have an ulcer by his wife who does most of his wound care. He came in today for [place] to look at this. He has not had a history of wounds in his buttocks in spite of his paraplegia. 12/08/17; seen in follow-up today at his usual appointment. He was seen earlier this week and found to have a new wound on his buttock. We also follow him for wounds on the left lateral leg, left first second toe web space and right first second toe web space 12/15/17; we have been using Hydrofera Blue to the left lateral leg which has improved. The right first second toe web space has also improved. Left first second toe web space plantar aspect looks stable.  The left buttock has worsened using Santyl. Apparently the buttock has drainage 12/22/17; we have been using Hydrofera Blue to the left lateral leg which continues to improve now 2 small wounds separated by normal skin. He tells [place] he had a fever up to 100 yesterday he is prone to UTIs but has not noted anything different. He does in and out catheterizations. The area between the first and second toes today does not look good necrotic surface covered with what looks to be  purulent drainage and erythema extending into the third toe. I had gotten this to something that I thought look better last time however it is not look good today. He also has a necrotic surface over the buttock wound which is expanded. I thought there might be infection under here so I removed a lot of the surface with a #5 curet though nothing look like it really needed culturing. He is been using Santyl to this area 12/27/17; his original wound on the left lateral leg continues to improve using Hydrofera Blue. I gave him samples of Baxdella although he was unable to take them out of fear for an allergic reaction ["lump in his throat"].the culture I did of the purulent drainage from his second toe last week showed both enterococcus and a set Enterobacter I was also concerned about the erythema on the bottom of his foot although paradoxically although this looks somewhat better today. 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 We've been using silver alginate here. They came up today that he sleeps in a recliner, I'm not sure why but I asked him to stop this 01/03/18; his original wound we've been using Hydrofera Blue is now separated into 2 areas. Ulcer on his left buttock is better he is off the recliner and sleeping in bed Finally both wound areas between his first and second toes also looks some better 01/10/18; his original wound on the  left lateral leg is now separated into 2 wounds we've been using Hydrofera Blue Ulcer on his left buttock has some drainage. There is a small probing site going into muscle layer superiorly.using silver alginate -He arrives today with a deep tissue injury on the left heel The wound on the dorsal aspect of his first second toe on the left looks a lot betterusing silver alginate ketoconazole The area on the first second toe web space on the right also looks a lot bette 01/17/18; his original wound on the left lateral leg continues to progress using Hydrofera Blue Ulcer on his left buttock also is smaller surface healthier except for a small probing site going into the muscle layer superiorly. 2.4 cm of tunneling in this area DTI on his left heel we have only been offloading. Looks better than last week no threatened open no evidence of infection the wound on the dorsal aspect of the first second toe on the left continues to look like it's regressing we have only been using silver alginate and terbinafine orally The area in the first second toe web space on the right also looks to be a lot better using silver alginate and terbinafine I think this was prompted by tinea pedis 01/31/18; the patient was hospitalized in [place] last week apparently for a complicated UTI. He was discharged on cefepime he does in and out catheterizations. In the hospital he was discovered M I don't mild elevation of AST and ALT and the terbinafine was stopped.predictably the pressure ulcer on s his buttock looks betterusing silver alginate. The area on the left lateral leg also is better using Hydrofera Blue. The area between the first and second toes on the left better. First and second toes on the right still substantial but better. Finally the DTI on the left heel has held together and looks like it's resolving 02/07/18-he is here in follow-up evaluation for multiple ulcerations. He has new injury to the lateral aspect of  the last  issue a pressure ulcer, he states this is from adhesive removal trauma. He states he has tried multiple adhesive products with no success. All other ulcers appear stable. The left heel DTI is resolving. We will continue with same treatment plan and follow-up next week. 02/14/18; follow-up for multiple areas. He has a new area last week on the lateral aspect of his pressure ulcer more over the posterior trochanter. The original pressure ulcer looks quite stable has healthy granulation. 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 Almost fully epithelialized on the original superior area using Hydrofera Blue DTI on the left heel has peeled off this week to reveal a small superficial wound under denuded skin and subcutaneous tissue Both areas between the first and second toes look better including nothing open on the left 02/21/18; The patient's wounds on his left ischial tuberosity and posterior left greater trochanter actually looked better. He has a large area of irritation around the area which I think is contact dermatitis. I am doubtful that this is fungal His original wound on the left lateral calf continues to improve we have been using Hydrofera Blue There is no open area in the left first second toe web space although there is a lot of thick callus The DTI on the left heel required debridement today of necrotic surface eschar and subcutaneous tissue using silver alginate Finally the area on the right first second toe webspace continues to contract using silver alginate and ketoconazole 02/28/18 Left ischial tuberosity wounds look better using silver alginate. Original wound on the left calf only has one small open area left using Hydrofera Blue DTI on the left heel required debridement mostly removing skin from around this wound surface. Using silver alginate The areas on the right  first/second toe web space using silver alginate and ketoconazole 03/08/18 on evaluation today patient appears to be doing decently well as best I can tell in regard to his wounds. This is the first time that I have seen him as he generally is followed by Dr. Dellia Nims. With that being said none of his wounds appear to be infected he does have an area where there is some skin covering what appears to be a new wound on the left dorsal surface of his great toe. This is right at the nail bed. With that being said I do believe that debrided away some of the excess skin can be of benefit in this regard. Otherwise he has been tolerating the dressing changes without complication. 03/14/18; patient arrives today with the multiplicity of wounds that we are following. He has not been systemically unwell Original wound on the left lateral calf now only has 2 small open areas we've been using Hydrofera Blue which should continue The deep tissue injury on the left heel requires debridement today. We've been using silver alginate The left first second toe and the right first second toe are both are reminiscence what I think was tinea pedis. Apparently some of the callus Surface between the toes was removed last week when it started draining. Purulent drainage coming from the wound on the ischial tuberosity on the left. 03/21/18-He is here in follow-up evaluation for multiple wounds. 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 He tolerated debridement. The only change to last week's recommendations is to discontinue antifungal cream between toes. He will follow-up next week 03/28/18; following  up for multiple wounds;Concern this week is streaking redness and swelling in the right foot. He is going to need antibiotics for this. 03/31/18; follow-up for right foot cellulitis. Streaking redness and swelling in the right foot on 03/28/18. He has multiple  antibiotic intolerances and a history of MRSA. I put him on clindamycin 300 mg every 6 and brought him in for a quick check. He has an open wound between his first and second toes on the right foot as a potential source. 04/04/18; Right foot cellulitis is resolving he is completing clindamycin. This is truly good news Left lateral calf wound which is initial wound only has one small open area inferiorly this is close to healing out. He has compression stockings. We will use Hydrofera Blue right down to the epithelialization of this Nonviable surface on the left heel which was initially pressure with a DTI. We've been using Hydrofera Blue. I'm going to switch this back to silver alginate Left first second toe/tinea pedis this looks better using silver alginate Right first second toe tinea pedis using silver alginate Large pressure ulcers on theLeft ischial tuberosity. Small wound here Looks better. I am uncertain about the surface over the large wound. Using silver alginate 04/11/18; Cellulitis in the right foot is resolved Left lateral calf wound which was his original wounds still has 2 tiny open areas remaining this is just about closed Nonviable surface on the left heel is better but still requires debridement Left first second toe/tinea pedis still open using silver alginate Right first second toe wound tinea pedis I asked him to go back to using ketoconazole and silver alginate Large pressure ulcers on the left ischial tuberosity this shear injury here is resolved. Wound is smaller. No evidence of infection using silver alginate 04/18/18; Patient arrives with an intense area of cellulitis in the right mid lower calf extending into the right heel area. Bright red and warm. Smaller area on the left anterior leg. He has a significant history of MRSA. He will definitely need antibioticsdoxycycline He now has 2 open areas on the left ischial tuberosity the original large wound and now a satellite  area which I think was above his initial satellite areas. Not a wonderful surface on this satellite area surrounding erythema which looks like pressure related. His left lateral calf wound again his original wound is just about closed Left heel pressure injury still requiring debridement Left first second toe looks a lot better using silver alginate Right first second toe also using silver alginate and ketoconazole cream also looks better 04/20/18; the patient was worked in early today out of concerns with his cellulitis on the right leg. I had started him on doxycycline. This was 2 days ago. His wife was concerned about the swelling in the area. Also concerned about the left buttock. He has not been systemically unwell no fever chills. No nausea vomiting or diarrhea 04/25/18; the patient's left buttock wound is continued to deteriorate he is using Hydrofera Blue. He is still completing clindamycin for the cellulitis on the right leg although all of this looks better. 05/02/18 Left buttock wound still with a lot of drainage and a very tightly adherent fibrinous necrotic surface. He has a deeper area superiorly The left lateral calf wound is still closed DTI wound on the left heel necrotic surface especially the circumference using Iodoflex Areas between his left first second toe and right first second toe both look better. Dorsally and the right first second toe he had a  necrotic surface although at smaller. In using silver alginate and ketoconazole. I did a culture last week which was a deep tissue culture of the reminiscence of the open wound on the right first second toe dorsally. This grew a few Acinetobacter and a few methicillin-resistant staph aureus. Nevertheless the area actually this week looked better. I didn't feel the need to specifically address this at least in terms of systemic antibiotics. 05/09/18; wounds are measuring larger more drainage per our intake. We are using Santyl covered  with alginate on the large superficial buttock wounds, Iodosorb on the left heel, ketoconazole and silver alginate to the dorsal first and second toes bilaterally. 05/16/18; The area on his left buttock better in some aspects although the area superiorly over the ischial tuberosity required an extensive debridement.using Santyl Left heel appears stable. Using Iodoflex The areas between his first and second toes are not bad however there is spreading erythema up the dorsal aspect of his left foot this looks like cellulitis again. He is insensate the erythema is really very brilliant.o Erysipelas He went to see an allergist days ago because he was itching part of this he had lab work done. This showed a white count of 15.1 with 70% neutrophils. Hemoglobin of 11.4 and a platelet count of 659,000. Last white count we had in Epic was a 2-1/2 years ago which was 25.9 but he was ill at the time. He was able to show me some lab work that was done by his primary physician the pattern is about the same. 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 But prompted me to go ahead and do x-rays of both feet and the pelvis rule out osteomyelitis. He also had a comprehensive metabolic panel this was reasonably normal his albumin was 3.7 liver function tests BUN/creatinine all normal 05/23/18; x-rays of both his feet from last week were negative for underlying pulmonary abnormality. The x-ray of his pelvis however showed mild irregularity in the left ischial which may represent some early osteomyelitis. The wound in the left ischial continues to get deeper clearly now exposed muscle. Each week necrotic surface material over this area. Whereas the rest of the wounds do not look so bad. The left ischial wound we have been using Santyl and calcium alginate T the left heel surface necrotic debris using Iodoflex o The left lateral leg is still healed Areas on the left dorsal foot  and the right dorsal foot are about the same. There is some inflammation on the left which might represent contact dermatitis, fungal dermatitis I am doubtful cellulitis although this looks better than last week 05/30/18; CT scan done at Hospital did not show any osteomyelitis or abscess. Suggested the possibility of underlying cellulitis although I don't see a lot of evidence of this at the bedside The wound itself on the left buttock/upper thigh actually looks somewhat better. No debridement Left heel also looks better no debridement continue Iodoflex Both dorsal first second toe spaces appear better using Lotrisone. Left still required debridement 06/06/18; Intake reported some purulent looking drainage from the left gluteal wound. Using Santyl and calcium alginate Left heel looks better although still a nonviable surface requiring debridement The left dorsal foot first/second webspace actually expanding and somewhat deeper. I may consider doing a shave biopsy of this area Right dorsal foot first/second webspace appears stable to improved. Using Lotrisone and silver alginate to both these areas 06/13/18 Left gluteal surface looks better. Now  separated in the 2 wounds. No debridement required. Still drainage. We'll continue silver alginate Left heel continues to look better with Iodoflex continue this for at least another week Of his dorsal foot wounds the area on the left still has some depth although it looks better than last week. We've been using Lotrisone and silver alginate 06/20/18 Left gluteal continues to look better healthy tissue Left heel continues to look better healthy granulation wound is smaller. He is using Iodoflex and his long as this continues continue the Iodoflex Dorsal right foot looks better unfortunately dorsal left foot does not. There is swelling and erythema of his forefoot. He had minor trauma to this several days ago but doesn't think this was enough to have caused any  tissue injury. Foot looks like cellulitis, we have had this problem before 06/27/18 on evaluation today patient appears to be doing a little worse in regard to his foot ulcer. 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 Both of which would really be his only options for treating this infection. In the past he has been given and effusion of Orbactiv. This is done very well for him in the past again it's one time dosing IV antibiotic therapy. Subsequently I do believe this is something we're gonna need to see about doing at this point in time. Currently his other wounds seem to be doing somewhat better in my pinion I'm pretty happy in that regard. 07/03/18 on evaluation today patient's wounds actually appear to be doing fairly well. He has been tolerating the dressing changes without complication. All in all he seems to be showing signs of improvement. In regard to the antibiotics he has been dealing with infectious disease since I saw him last week as far as getting this scheduled. In the end [facility]'s [facility] this coming Friday. In the meantime he has been continuing to perform the dressing changes in such as previous. There does not appear to be any evidence of infection worsengin at this time. 07/10/18; Since I last saw this man 2 weeks ago things have actually improved. IV antibiotics of resulted in less forefoot erythema although there is still some present. He is not systemically unwell Left buttock wounds 2 now have no depth there is increased epithelialization Using silver alginate Left heel still requires debridement using Iodoflex Left dorsal foot still with a sizable wound about the size of a border but healthy granulation Right dorsal foot still with a slitlike area using silver alginate 07/18/18;  the patient's cellulitis in the left foot is improved in fact I think it is on its way to resolving. Left buttock wounds 2 both look better although the larger one has hypertension granulation we've been using silver alginate Left heel has some thick circumferential redundant skin over the wound edge which will need to be removed today we've been using Iodoflex Left dorsal foot is still a sizable wound required debridement using silver alginate The right dorsal foot is just about closed only a small open area remains here 07/25/18; left foot cellulitis is resolved Left buttock wounds 2 both look better. Hyper-granulation on the major area Left heel as some debris over the surface but otherwise looks a healthier wound. Using silver collagen Right dorsal foot is just about closed 07/31/18; arrives with our intake  nurse worried about purulent drainage from the buttock. We had hyper-granulation here last week His buttock wounds 2 continue to look better Left heel some debris over the surface but measuring smaller. Right dorsal foot unfortunately has openings between the toes Left foot superficial wound looks less aggravated. 08/07/18 Buttock wounds continue to look better although some of her granulation and the larger medial wound. silver alginate Left heel continues to look a lot better.silver collagen Left foot superficial wound looks less stable. Requires debridement. He has a new wound superficial area on the foot on the lateral dorsal foot. Right foot looks better using silver alginate without Lotrisone 08/14/2018; patient was in the ER last week diagnosed with a UTI. He is now on Cefpodoxime and Macrodantin. Buttock wounds continued to be smaller. Using silver alginate Left heel continues to look better using silver collagen Left foot superficial wound looks as though it is improving Right dorsal foot area is just about healed. 08/21/2018; patient is completed his antibiotics for his UTI. He has  2 open areas on the buttocks. There is still not closed although the surface looks satisfactory. Using silver alginate Left heel continues to improve using silver collagen The bilateral dorsal foot areas which are at the base of his first and second toes/possible tinea pedis are actually stable on the left but worse on the right. The area on the left required debridement of necrotic surface. After debridement I obtained a specimen for PCR culture. The right dorsal foot which is been just about healed last week is now reopened 08/28/2018; culture done on the left dorsal foot showed coag negative staph both staph epidermidis and Lugdunensis. I think this is worthwhile initiating systemic treatment. I will use doxycycline given his long list of allergies. The area on the left heel slightly improved but still requiring debridement. The large wound on the buttock is just about closed whereas the smaller one is larger. Using silver alginate in this area 09/04/2018; patient is completing his doxycycline for the left foot although this continues to be a very difficult wound area with very adherent necrotic debris. We are using silver alginate to all his wounds right foot left foot and the small wounds on his buttock, silver collagen on the left heel. 09/11/2018; once again this patient has intense erythema and swelling of the left forefoot. Lesser degrees of erythema in the right foot. He has a long list of allergies and intolerances. I will reinstitute doxycycline. 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 Using silver alginate Left heel also looks better using silver collagen Unfortunately both the areas on his feet look worse. The area on the left first second webspace is now gone through to the plantar part of his foot. The area on the left foot anteriorly is irritated with erythema and swelling in the forefoot. 09/25/2018 His wound on the left plantar heel  looks better. Using silver collagen The area on the left buttock 2 small remnant areas. One is closed one is still open. Using silver alginate The areas between both his first and second toes look worse. This in spite of long-standing antifungal therapy with ketoconazole and silver alginate which should have antifungal activity He has small areas around his original wound on the left calf one is on the bottom of the original scar tissue and one superiorly both of these are small and superficial but again given wound history in this site this is worrisome 10/02/2018  Left plantar heel continues to gradually contract using silver collagen Left buttock wound is unchanged using silver alginate The areas on his dorsal feet between his first and second toes bilaterally look about the same. I prescribed clindamycin ointment to see if we can address chronic staph colonization and also the underlying possibility of erythrasma The left lateral lower extremity wound is actually on the lateral part of his ankle. Small open area here. We have been using silver alginate 10/09/2018; Left plantar heel continues to look healthy and contract. No debridement is required Left buttock slightly smaller with a tape injury wound just below which was new this week Dorsal feet somewhat improved I have been using clindamycin Left lateral looks lower extremity the actual open area looks worse although a lot of this is epithelialized. I am going to change to silver collagen today He has a lot more swelling in the right leg although this is not pitting not red and not particularly warm there is a lot of spasm in the right leg usually indicative of people with paralysis of some underlying discomfort. We have reviewed his vascular status from 2017 he had a left greater saphenous vein ablation. I wonder about referring him back to vascular surgery if the area on the left leg continues to deteriorate. 10/16/2018 in today for  follow-up and management of multiple lower extremity ulcers. His left Buttock wound is much lower smaller and almost closed completely. The wound to the left ankle has began to reopen with Epithelialization and some adherent slough. He has multiple new areas to the left foot and leg. The left dorsal foot without much improvement. Wound present between left great webspace and 2nd toe. Erythema and edema present right leg. Right LE ultrasound obtained on 10/10/18 was negative for DVT . 10/23/2018; Left buttock is closed over. Still dry macerated skin but there is no open wound. I suspect this is chronic pressure/moisture Left lateral calf is quite a bit worse than when I saw this last. There is clearly drainage here he has macerated skin into the left plantar heel. We will change the primary dressing to alginate Left dorsal foot has some improvement in overall wound area. Still using clindamycin and silver alginate Right dorsal foot about the same as the left using clindamycin and silver alginate The erythema in the right leg has resolved. He is DVT rule out was negative Left heel pressure area required debridement although the wound is smaller and the surface is health 10/26/2018 The patient came back in for his nurse check today predominantly because of the drainage coming out of the left lateral leg with a recent reopening of his original wound on the left lateral calf. He comes in today with a large amount of surrounding erythema around the wound extending from the calf into the ankle and even in the area on the dorsal foot. He is not systemically unwell. He is not febrile. Nevertheless this looks like cellulitis. We have been using silver alginate to the area. I changed him to a regular visit and I am going to prescribe him doxycycline. The rationale here is a long list of medication intolerances and a history of MRSA. I did not see anything that I thought would provide a valuable culture  10/30/2018 Follow-up from his appointment 4 days ago with really an extensive area of cellulitis in the left calf left lateral ankle and left dorsal foot. I put him on doxycycline. He has a long list of medication allergies which are  true allergy reactions. 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 In any case he is a lot better today. The erythema especially in the anterior and lateral left calf is better. He still has left ankle erythema. He also is complaining about increasing edema in the right leg we have only been using Kerlix Coban and he has been doing the wraps at home. Finally he has a spotty rash on the medial part of his upper left calf which looks like folliculitis or perhaps wrap occlusion type injury. Small superficial macules not pustules 11/06/18 patient arrives today with again a considerable degree of erythema around the wound on the left lateral calf extending into the dorsal ankle and dorsal foot. This is a lot worse than when I saw this last week. He is on doxycycline really with not a lot of improvement. He has not been systemically unwell Wounds on the; left heel actually looks improved. Original area on the left foot and proximity to the first and second toes looks about the same. He has superficial areas on the dorsal foot, anterior calf and then the reopening of his original wound on the left lateral calf which looks about the same The only area he has on the right is the dorsal webspace first and second which is smaller. He has a large area of dry erythematous skin on the left buttock small open area here. 11/13/2018; the patient arrives in much better condition. The erythema around the wound on the left lateral calf is a lot better. Not sure whether this was the clindamycin or the TCA and ketoconazole or just in the improvement in edema control [stasis dermatitis]. In any case this is a lot better. The  area on the left heel is very small and just about resolved using silver collagen we have been using silver alginate to the areas on his dorsal feet 11/20/2018; his wounds include the left lateral calf, left heel, dorsal aspects of both feet just proximal to the first second webspace. He is stable to slightly improved. I did not think any changes to his dressings were going to be necessary 11/27/2018 he has a reopening on the left buttock which is surrounded by what looks like tinea or perhaps some other form of dermatitis. The area on the left dorsal foot has some erythema around it I have marked this area but I am not sure whether this is cellulitis or not. Left heel is not closed. Left calf the reopening is really slightly longer and probably worse 1/13; in general things look better and smaller except for the left dorsal foot. Area on the left heel is just about closed, left buttock looks better only a small wound remains in the skin looks better [using Lotrisone] 1/20; the area on the left heel only has a few remaining open areas here. Left lateral calf about the same in terms of size, left dorsal foot slightly larger right lateral foot still not closed. The area on the left buttock has no open wound and the surrounding skin looks a lot better 1/27; the area on the left heel is closed. Left lateral calf better but still requiring extensive debridements. The area on his left buttock is closed. He still has the open areas on the left dorsal foot which is slightly smaller in the right foot which is slightly expanded. We have been using Iodoflex on these areas as well 2/3; left heel is closed. Left  lateral calf still requiring debridement using Iodoflex there is no open area on his left buttock however he has dry scaly skin over a large area of this. Not really responding well to the Lotrisone. Finally the areas on his dorsal feet at the level of the first second webspace are slightly smaller on the  right and about the same on the left. Both of these vigorously debrided with Anasept and gauze 2/10; left heel remains closed he has dry erythematous skin over the left buttock but there is no open wound here. Left lateral leg has come in and with. Still requiring debridement we have been using Iodoflex here. Finally the area on the left dorsal foot and right dorsal foot are really about the same extremely dry callused fissured areas. He does not yet have a dermatology appointment 2/17; left heel remains closed. He has a new open area on the left buttock. The area on the left lateral calf is bigger longer and still covered in necrotic debris. No major change in his foot areas bilaterally. I am awaiting for a dermatologist to look on this. We have been using ketoconazole I do not know that this is been doing any good at all. 2/24; left heel remains closed. The left buttock wound that was new reopening last week looks better. The left lateral calf appears better also although still requires debridement. The major area on his foot is the left first second also requiring debridement. We have been putting Prisma on all wounds. I do not believe that the ketoconazole has done too much good for his feet. He will use Lotrisone I am going to give him a 2-week course of terbinafine. We still do not have a dermatology appointment 3/2 left heel remains closed however there is skin over bone in this area I pointed this out to him today. The left buttock wound is epithelialized but still does not look completely stable. The area on the left leg required debridement were using silver collagen here. With regards to his feet we changed to Lotrisone last week and silver alginate. 3/9; left heel remains closed. Left buttock remains closed. The area on the right foot is essentially closed. The left foot remains unchanged. Slightly smaller on the left lateral calf. Using silver collagen to both of these areas 3/16-Left  heel remains closed. Area on right foot is closed. Left lateral calf above the lateral malleolus open wound requiring debridement with easy bleeding. Left dorsal wound proximal to first toe also debrided. Left ischial area open new. Patient has been using Prisma with wrapping every 3 days. Dermatology appointment is apparently tomorrow.Patient has completed his terbinafine 2-week course with some apparent improvement according to him, there is still flaking and dry skin in his foot on the left 3/23; area on the right foot is reopened. The area on the left anterior foot is about the same still a very necrotic adherent surface. He still has the area on the left leg and reopening is on the left buttock. He apparently saw dermatology although I do not have a note. According to the patient who is usually fairly well informed they did not have any good ideas. Put him on oral terbinafine which she is been on before. 3/30; using silver collagen to all wounds. Apparently his dermatologist put him on doxycycline and rifampin presumably some culture grew staph. I do not have this result. He remains on terbinafine although I have used terbinafine on him before 4/6; patient has had a fairly  substantial reopening on the right foot between the first and second toes. He is finished his terbinafine and I believe is on doxycycline and rifampin still as prescribed by dermatology. We have been using silver collagen to all his wounds although the patient reports that he thinks silver alginate does better on the wounds on his buttock. 4/13; the area on his left lateral calf about the same size but it did not require debridement. Left dorsal foot just proximal to the webspace between the first and second toes is about the same. Still nonviable surface. I note some superficial bronze discoloration of the dorsal part of his foot Right dorsal foot just proximal to the first and second toes also looks about the same. I still  think there may be the same discoloration I noted above on the left Left buttock wound looks about the same 4/20; left lateral calf appears to be gradually contracting using silver collagen. 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 The left dorsal foot wound is debrided of tightly adherent necrotic debris and really cleans up quite nicely. The right area is worse with expansion. I did not debride this it is now over the base of the second toe The area on his left buttock is smaller no debridement is required using silver collagen 5/4; left calf continues to make good progress. He arrives with erythema around the wounds on his dorsal foot which even extends to the plantar aspect. Very concerning for coexistent infection. He is finished the erythromycin I gave him for possible erythrasma this does not seem to have helped. The area on the left foot is about the same base of the dorsal toes Is area on the buttock looks improved on the left 5/11; left calf and left buttock continued to make good progress. Left foot is about the same to slightly improved. Major problem is on the right foot. He has not had an x-ray. Deep tissue culture I did last week showed both Enterobacter and E. coli. I did not change the doxycycline I put him on empirically although neither 1 of these were plated to doxycycline. He arrives today with the erythema looking worse on both the dorsal and plantar foot. Macerated skin on the bottom of the foot. he has not been systemically unwell 5/18-Patient returns at 1 week, left calf wound appears to be making some progress, left buttock wound appears slightly worse than last time, left foot wound looks slightly better, right foot redness is marginally better. X-ray of both feet show no air or evidence of osteomyelitis. Patient is finished his Omnicef and terbinafine. He continues to have macerated skin on the bottom of the left  foot as well as right 5/26; left calf wound is better, left buttock wound appears to have multiple small superficial open areas with surrounding macerated skin. X-rays that I did last time showed no evidence of osteomyelitis in either foot. He is finished cefdinir and doxycycline. I do not think that he was on terbinafine. He continues to have a large superficial open area on the right foot anterior dorsal and slightly between the first and second toes. I did send him to dermatology 2 months ago or so wondering about whether they would do a fungal scraping. I do not believe they did but did do a culture. We have been using silver alginate to the toe areas, he has been using antifungals at home topically either ketoconazole or Lotrisone. We are using silver collagen on  the left foot, silver alginate on the right, silver collagen on the left lateral leg and silver alginate on the left buttock 6/1; left buttock area is healed. We have the left dorsal foot, left lateral leg and right dorsal foot. We are using silver alginate to the areas on both feet and silver collagen to the area on his left lateral calf 6/8; the left buttock apparently reopened late last week. He is not really sure how this happened. He is tolerating the terbinafine. Using silver alginate to all wounds 6/15; left buttock wound is larger than last week but still superficial. Came in the clinic today with a report of purulence from the left lateral leg I did not identify any infection Both areas on his dorsal feet appear to be better. He is tolerating the terbinafine. Using silver alginate to all wounds 6/22; left buttock is about the same this week, left calf quite a bit better. His left foot is about the same however he comes in with erythema and warmth in the right forefoot once again. Culture that I gave him in the beginning of May showed Enterobacter and E. coli. I gave him doxycycline and things seem to improve although neither 1  of these organisms was specifically plated. 6/29; left buttock is larger and dry this week. Left lateral calf looks to me to be improved. Left dorsal foot also somewhat improved right foot completely unchanged. The erythema on the right foot is still present. He is completing the Ceftin dinner that I gave him empirically [see discussion above.) 7/6 - All wounds look to be stable and perhaps improved, the left buttock wound is slightly smaller, per patient bleeds easily, completed ceftin, the right foot redness is less, he is on terbinafine 7/13; left buttock wound about the same perhaps slightly narrower. Area on the left lateral leg continues to narrow. Left dorsal foot slightly smaller right foot about the same. We are using silver alginate on the right foot and Hydrofera Blue to the areas on the left. Unna boot on the left 2 layer compression on the right 7/20; left buttock wound absolutely the same. Area on lateral leg continues to get better. Left dorsal foot require debridement as did the right no major change in the 7/27; left buttock wound the same size necrotic debris over the surface. The area on the lateral leg is closed once again. His left foot looks better right foot about the same although there is some involvement now of the posterior first second toe area. He is still on terbinafine which I have given him for a month, not certain a centimeter major change 06/25/19-All wounds appear to be slightly improved according to report, left buttock wound looks clean, both foot wounds have minimal to no debris the right dorsal foot has minimal slough. We are using Hydrofera Blue to the left and silver alginate to the right foot and ischial wound. 8/10-Wounds all appear to be around the same, the right forefoot distal part has some redness which was not there before, however the wound looks clean and small. Ischial wound looks about the same with no changes 8/17; his wound on the left lateral  calf which was his original chronic venous insufficiency wound remains closed. Since I last saw him the areas on the left dorsal foot right dorsal foot generally appear better but require debridement. The area on his left initial tuberosity appears somewhat larger to me perhaps hyper granulated and bleeds very easily. We have been using Hydrofera  Blue to the left dorsal foot and silver alginate to everything else 8/24; left lateral calf remains closed. The areas on his dorsal feet on the webspace of the first and second toes bilaterally both look better. The area on the left buttock which is the pressure ulcer stage II slightly smaller. I change the dressing to Hydrofera Blue to all areas 8/31; left lateral calf remains closed. The area on his dorsal feet bilaterally look better. Using Hydrofera Blue. Still requiring debridement on the left foot. No change in the left buttock pressure ulcers however 9/14; left lateral calf remains closed. Dorsal feet look quite a bit better than 2 weeks ago. Flaking dry skin also a lot better with the ammonium lactate I gave him 2 weeks ago. The area on the left buttock is improved. He states that his Roho cushion developed a leak and he is getting a new one, in the interim he is offloading this vigorously 9/21; left calf remains closed. Left heel which was a possible DTI looks better this week. He had macerated tissue around the left dorsal foot right foot looks satisfactory and improved left buttock wound. I changed his dressings to his feet to silver alginate bilaterally. Continuing Hydrofera Blue on the left buttock. 9/28 left calf remains closed. Left heel did not develop anything [possible DTI] dry flaking skin on the left dorsal foot. Right foot looks satisfactory. Improved left buttock wound. We are using silver alginate on his feet Hydrofera Blue on the buttock. I have asked him to go back to the Lotrisone on his feet including the wounds and surrounding  areas 10/5; left calf remains closed. The areas on the left and right feet about the same. A lot of this is epithelialized however debris over the remaining open areas. He is using Lotrisone and silver alginate. The area on the left buttock using Hydrofera Blue 10/26. Patient has been out for 3 weeks secondary to Covid concerns. He tested negative but I think his wife tested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 pictures and according to the patient the left leg has increased swelling versus the right 2/17/7 the patient returns today with the wounds on his right anterior leg and right Achilles actually in fairly good condition. The most worrisome areas are on the lateral aspect of wrist left lower leg which requires difficult debridement so tightly adherent fibrinous slough and nonviable subcutaneous tissue. On the posterior aspect of his left Achilles heel there is a raised area with an ulcer in the middle. The patient and apparently his wife have no history to this. This may need to be biopsied. He has the  arterial and venous studies we ordered last week ordered for March 01/16/16; the patient's 2 wounds on his right leg on the anterior leg and Achilles area are both healed. He continues to have a deep wound with very adherent necrotic eschar and slough on the lateral aspect of his left leg in 2 areas and also raised area over the left Achilles. We put Santyl on this last week and left him in a rapid. He says the drainage went through. He has some Kerlix Coban and in some Profore at home I have therefore written him a prescription for Santyl and he can change this at home on his own. 01/23/16; the original 2 wounds on the right leg are apparently still closed. He continues to have a deep wound on his left lateral leg in 2 spots the superior one much larger than the inferior one. He also has a raised area on the left Achilles. We have been putting Santyl and all of these wounds. His wife is changing this at home one time this week although she may be able to do this more frequently. 01/30/16 no open wounds on the right leg. He continues to have a deep wound on the left lateral leg in 2 spots and a smaller wound over the left Achilles area. [name] This debridement is with great difficulty. [name] 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 In spite of that his leg actually looks somewhat better. There is still copious drainage and some erythema but it is generally better. The oral options that were obvious including Zyvox and sulfonamides he has rash issues both of these.  This is sensitive to rifampin but this is not usually used along gentamicin but this is parenteral and again not used along. The obvious  alternative is vancomycin. He has had his arterial studies. [name] T brachial index was 1.3 oe on the right. His waveforms were biphasic bilaterally. Doppler waveforms of the digit were normal in the right damp and on the left. Comment that this could've been due to extreme edema. His venous studies show reflux on both sides in the femoral popliteal veins as well as the greater and lesser saphenous veins bilaterally. Ultimately he is going to need to see vascular surgery about this issue. Hopefully when we can get his wounds and a little better shape. 02/19/16; the patient was able to complete a course of Delavan's for MRSA in the face of multiple antibiotic allergies. Arterial studies showed an ABI of him 0.88 on the right 1.17 on the left the. Waveforms were biphasic at the posterior tibial and dorsalis pedis digital waveforms were normal. Right toe brachial index was 1.3 limited by shaking and edema. His venous study showed widespread reflux in the left at the common femoral vein the greater and lesser saphenous vein the greater and lesser saphenous vein on the right as well as the popliteal and femoral vein. The popliteal and femoral vein on the left did not show reflux. His wounds on the right leg give healed on the left he is still using Santyl. 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 The erythema has not really resolved and I wonder if this is mostly venous inflammation rather than cellulitis. Still using Santyl. He is approved for Apligraf 03/04/16; there is less erythema around the wound. Both wounds require aggressive surgical debridement. Not yet ready for Apligraf 03/11/16; aggressive debridement again. Not ready for Apligraf 03/18/16 aggressive debridement again. 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 Has been to see vascular surgery he is being planned for a venous ablation 03/25/16;  aggressive debridement again of both wound areas on the left lateral leg. He is due for ablation surgery on May 22. He is much closer to being ready for an Apligraf. Has a new area between the left first and second toes 04/01/16 aggressive debridement done of both wounds. The new wound at the base of between his second and first toes looks stable 04/08/16; continued aggressive debridement of both wounds on the left lower leg. He goes for his venous ablation on Monday. The new wound at the base of his first and second toes dorsally appears stable. 04/15/16; wounds aggressively debridement although the base of this looks considerably better Apligraf #1. He had ablation surgery on Monday I'll need to research these records. We only have approval for four Apligraf's 04/22/16; the patient is here for a wound check [Apligraf last week] intake nurse concerned about erythema around the wounds. Apparently a significant degree of drainage. The patient has chronic venous inflammation which I think accounts for most of this however I was asked to look at this today 04/26/16; the patient came back for check of possible cellulitis in his left foot however the Apligraf dressing was inadvertently removed therefore we elected to prep the wound for a second Apligraf. I put him on doxycycline on 6/1 the erythema in the foot 05/03/16 we did not remove the dressing from the superior wound as this is where I put all of his last Apligraf.  Surface debridement done with a curette of the lower wound which looks very healthy. The area on the left foot also looks quite satisfactory at the dorsal artery at the first and second toes 05/10/16; continue Apligraf to this. Her wound, Hydrafera to the lower wound. He has a new area on the right second toe. Left dorsal foot firstoosecond toe also looks improved 05/24/16; wound dimensions must be smaller I was able to use Apligraf to all 3 remaining wound areas. 06/07/16 patient's last Apligraf  was 2 weeks ago. He arrives today with the 2 wounds on his lateral left leg joined together. This would have to be seen as a negative. He also has a small wound in his first and second toe on the left dorsally with quite a bit of surrounding erythema in the first second and third toes. This looks to be infected or inflamed, very difficult clinical call. 06/21/16: lateral left leg combined wounds. Adherent surface slough area on the left dorsal foot at roughly the fourth toe looks improved 07/12/16; he now has a single linear wound on the lateral left leg. This does not look to be a lot changed from when I lost saw this. The area on his dorsal left foot looks considerably better however. 08/02/16; no major change in the substantial area on his left lateral leg since last time. We have been using Hydrofera Blue for a prolonged period of time now. The area on his left foot is also unchanged from last review 07/19/16; the area on his dorsal foot on the left looks considerably smaller. He is beginning to have significant rims of epithelialization on the lateral left leg wound. This also looks better. 08/05/16; the patient came in for a nurse visit today. Apparently the area on his left lateral leg looks better and it was wrapped. However in general discussion the patient noted a new area on the dorsal aspect of his right second toe. The exact etiology of this is unclear but likely relates to pressure. 08/09/16 really the area on the left lateral leg did not really look that healthy today perhaps slightly larger and measurements. The area on his dorsal right second toe is improved also the left foot wound looks stable to improved 08/16/16; the area on the last lateral leg did not change any of dimensions. Post debridement with a curet the area looked better. Left foot wound improved and the area on the dorsal right second toe is improved 08/23/16; the area on the left lateral leg may be slightly smaller both in  terms of length and width. Aggressive debridement with a curette afterwards the tissue appears healthier. Left foot wound appears improved in the area on the dorsal right second toe is improved 08/30/16 patient developed a fever over the weekend and was seen in an urgent care. Felt to have a UTI and put on doxycycline. He has been since changed over the phone to [facility]. After we took off the wrap on his right leg today the leg is swollen warm and erythematous, probably more likely the source of the fever 09/06/16; have been using collagen to the major left leg wound, silver alginate to the area on his anterior foot/toes 09/13/16; the areas on his anterior foot/toes on both sides appear to be virtually closed. Extensive wound on the left lateral leg perhaps slightly narrower but each visit still covered an adherent surface slough 09/16/16 patient was in for his usual Thursday nurse visit however the intake nurse noted significant erythema of  his dorsal right foot. He is also running a low- grade fever and having increasing spasms in the right leg 09/20/16 here for cellulitis involving his right great toes and forefoot. This is a lot better. Still requiring debridement on his left lateral leg. Santyl direct says he needs prior authorization. Therefore his wife cannot change this at home 09/30/16; the patient's extensive area on the left lateral calf and ankle perhaps somewhat better. Using Santyl. The area on the left toes is healed and I think the area on his right dorsal foot is healed as well. There is no cellulitis or venous inflammation involving the right leg. He is going to need compression stockings here. 10/07/16; the patient's extensive wound on the left lateral calf and ankle does not measure any differently however there appears to be less adherent surface slough using Santyl and aggressive weekly debridements 10/21/16; no major change in the area on the left lateral calf. Still the same  measurement still very difficult to debridement adherent slough and nonviable subcutaneous tissue. This is not really been helped by several weeks of Santyl. Previously for 2 weeks I used Iodoflex for a short period. A prolonged course of Hydrofera Blue didn't really help. I'm not sure why I only used 2 weeks of Iodoflex on this there is no evidence of surrounding infection. He has a small area on the right second toe which looks as though it's progressing towards closure 10/28/16; the wounds on his toes appear to be closed. No major change in the left lateral leg wound although the surface looks somewhat better using Iodoflex. He has had previous arterial studies that were normal. He has had reflux studies and is status post ablation although I don't have any exact notes on which vein was ablated. I'll need to check the surgical record 11/04/16; he's had a reopening between the first and second toe on the left and right. No major change in the left lateral leg wound. There is what appears to be cellulitis of the left dorsal foot 11/18/16 the patient was hospitalized initially in [place] and then subsequently transferred to [facility] and was admitted there from 11/09/16 through 11/12/16. He had developed progressive cellulitis on the right leg in spite of the doxycycline I gave him. I'd spoken to the hospitalist in [place] who was concerned about continuing leukocytosis. CT scan is what I suggested this was done which showed soft tissue swelling without evidence of osteomyelitis or an underlying abscess blood cultures were negative. At [facility] he was treated with vancomycin and Primaxin and then add an infectious disease consult. He was transitioned to Ceftaroline. He has been making progressive improvement. Overall a severe cellulitis of the right leg. He is been using silver alginate to her original wound on the left leg. The wounds in his toes on the right are closed there is a small open area  on the base of the left second toe 11/26/15; the patient's right leg is much better although there is still some edema here this could be reminiscent from his severe cellulitis likely on top of some degree of lymphedema. His left anterior leg wound has less surface slough as reported by her intake nurse. Small wound at the base of the left second toe 12/02/16; patient's right leg is better and there is no open wound here. His left anterior lateral leg wound continues to have a healthy-looking surface. Small wound at the base of the left second toe however there is erythema in the left  forefoot which is worrisome 12/16/16; is no open wounds on his right leg. We took measurements for stockings. His left anterior lateral leg wound continues to have a healthy-looking surface. I'm not sure where we were with the Apligraf run through his insurance. We have been using Iodoflex. He has a thick eschar on the left first second toe interface, I suspect this may be fungal however there is no visible open 12/23/16; no open wound on his right leg. He has 2 small areas left of the linear wound that was remaining last week. We have been using Prisma, I thought I have disclosed this week, we can only look forward to next week 01/03/17; the patient had concerning areas of erythema last week, already on doxycycline for UTI through his primary doctor. The erythema is absolutely no better there is warmth and swelling both medially from the left lateral leg wound and also the dorsal left foot. 01/06/17- Patient is here for follow-up evaluation of his left lateral leg ulcer and bilateral feet ulcers. He is on oral antibiotic therapy, tolerating that. Nursing staff and the patient states that the erythema is improved from Monday. 01/13/17; the predominant left lateral leg wound continues to be problematic. I had put Apligraf on him earlier this month once. However he subsequently developed what appeared to be an intense cellulitis  around the left lateral leg wound. I gave him Dalvance I think on 2/12 perhaps 2/13 he continues on cefdinir. The erythema is still present but the warmth and swelling is improved. I am hopeful that the cellulitis part of this control. I wouldn't be surprised if there is an element of venous inflammation as well. 01/17/17. The erythema is present but better in the left leg. His left lateral leg wound still does not have a viable surface buttons certain parts of this long thin wound it appears like there has been improvement in dimensions. 01/20/17; the erythema still present but much better in the left leg. I'm thinking this is his usual degree of chronic venous inflammation. The wound on the left leg looks somewhat better. Is less surface slough 01/27/17; erythema is back to the chronic venous inflammation. The wound on the left leg is somewhat better. I am back to the point where I like to try an Apligraf once again 02/10/17; slight improvement in wound dimensions. Apligraf #2. He is completing his doxycycline 02/14/17; patient arrives today having completed doxycycline last Thursday. This was supposed to be a nurse visit however once again he hasn't tense erythema from the medial part of his wound extending over the lower leg. Also erythema in his foot this is roughly in the same distribution as last time. He has baseline chronic venous inflammation however this is a lot worse than the baseline I have learned to accept the on him is baseline inflammation 02/24/17- patient is here for follow-up evaluation. He is tolerating compression therapy. His voicing no complaints or concerns he is here anticipating an Apligraf 03/03/17; he arrives today with an adherent necrotic surface. I don't think this is surface is going to be amenable for Apligraf's. The erythema around his wound and on the left dorsal foot has resolved he is off antibiotics 03/10/17; better-looking surface today. I don't think he can tolerate  Apligraf's. He tells me he had a wound VAC after a skin graft years ago to this area and they had difficulty with a seal. The erythema continues to be stable around this some degree of chronic venous inflammation but he  also has recurrent cellulitis. We have been using Iodoflex 03/17/17; continued improvement in the surface and may be small changes in dimensions. Using Iodoflex which seems the only thing that will control his surface 03/24/17- He is here for follow up evaluation of his LLE lateral ulceration and ulcer to right dorsal foot/toe space. He is voicing no complaints or concerns, He is tolerating compression wrap. 03/31/17 arrives today with a much healthier looking wound on the left lower extremity. We have been using Iodoflex for a prolonged period of time which has for the first time prepared and adequate looking wound bed although we have not had much in the way of wound dimension improvement. He also has a small wound between the first and second toe on the right 04/07/17; arrives today with a healthy-looking wound bed and at least the top 50% of this wound appears to be now her. No debridement was required I have changed him to [facility] last week after prolonged Iodoflex. He did not do well with Apligraf's. We've had a re-opening between the first and second toe on the right 04/14/17; arrives today with a healthier looking wound bed contractions and the top 50% of this wound and some on the lesser 50%. Wound bed appears healthy. The area between the first and second toe on the right still remains problematic 04/21/17; continued very gradual improvement. Using [facility] 04/28/17; continued very gradual improvement in the left lateral leg venous insufficiency wound. His periwound erythema is very mild. We have been using Hydrofera Blue. Wound is making progress especially in the superior 50% 05/05/17; he continues to have very gradual improvement in the left lateral venous  insufficiency wound. Both in terms with an length rings are improving. I debrided this every 2 weeks with #5 curet and we have been using Hydrofera Blue and again making good progress With regards to the wounds between his right first and second toe which I thought might of been tinea pedis he is not making as much progress very dry scaly skin over the area. Also the area at the base of the left first and second toe in a similar condition 05/12/17; continued gradual improvement in the refractory left lateral venous insufficiency wound on the left. Dimension smaller. Surface still requiring debridement using Hydrofera Blue 05/19/17; continued gradual improvement in the refractory left lateral venous ulceration. Careful inspection of the wound bed underlying rumination suggested some degree of epithelialization over the surface no debridement indicated. Continue Hydrofera Blue difficult areas between his toes first and third on the left than first and second on the right. I'm going to change to silver alginate from silver collagen. Continue ketoconazole as I suspect underlying tinea pedis 05/26/17; left lateral leg venous insufficiency wound. We've been using Hydrofera Blue. I believe that there is expanding epithelialization over the surface of the wound albeit not coming from the wound circumference. This is a bit of an odd situation in which the epithelialization seems to be coming from the surface of the wound rather than in the exact circumference. There is still small open areas mostly along the lateral margin of the wound. ooHe has unchanged areas between the left first and second and the right first second toes which I been treating for tenia pedis 06/02/17; left lateral leg venous insufficiency wound. We have been using Hydrofera Blue. Somewhat smaller from the wound circumference. The surface of the wound remains a bit on it almost epithelialized sedation in appearance. I use an open curette today  debridement in the surface of all of this especially the edges ooSmall open wounds remaining on the dorsal right first and second toe interspace and the plantar left first second toe and her face on the left 06/09/17; wound on the left lateral leg continues to be smaller but very gradual and very dry surface using Hydrofera Blue 06/16/17 requires weekly debridements now on the left lateral leg although this continues to contract. I changed to silver collagen last week because of dryness of the wound bed. Using Iodoflex to the areas on his first and second toes/web space bilaterally 06/24/17; patient with history of paraplegia also chronic venous insufficiency with lymphedema. Has a very difficult wound on the left lateral leg. This has been gradually reducing in terms of with but comes in with a very dry adherent surface. High switch to silver collagen a week or so ago with hydrogel to keep the area moist. This is been refractory to multiple dressing attempts. He also has areas in his first and second toes bilaterally in the anterior and posterior web space. I had been using Iodoflex here after a prolonged course of silver alginate with ketoconazole was ineffective [question tinea pedis] 07/14/17; patient arrives today with a very difficult adherent material over his left lateral lower leg wound. He also has surrounding erythema and poorly controlled edema. He was switched his Santyl last visit which the nurses are applying once during his doctor visit and once on a nurse visit. He was also reduced to 2 layer compression I'm not exactly sure of the issue here. 07/21/17; better surface today after 1 week of Iodoflex. Significant cellulitis that we treated last week also better. [Doxycycline] 07/28/17 better surface today with now 2 weeks of Iodoflex. Significant cellulitis treated with doxycycline. 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 He reminds me he has had ablations surgery here 08/04/17; continued improvement with Iodoflex to the left lateral leg wound in terms of the surface of the wound although the dimensions are better. 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 Problematic areas on the plantar aspect of the first second toe web space on the left and the dorsal aspect of the first second toe web space on the right. At one point I felt these were probably related to chronic fungal infections in treated him aggressively for this although we have not made any improvement here. 08/11/17; left lateral leg. Surface continues to improve with the Iodoflex although we are not seeing much improvement in overall wound dimensions. Areas on his plantar left foot and right foot show no improvement. In fact the right foot looks somewhat worse 08/18/17; left lateral leg. We changed to [facility] Blue last week after a prolonged course of Iodoflex which helps get the surface better. It appears that the wound with is improved. Continue with difficult areas on the left dorsal first second and plantar first second on the right 09/01/17; patient arrives in clinic today having had a temperature of 103 yesterday. He was seen in the ER and [facility]. The patient was concerned he could have cellulitis again in the right leg however they diagnosed him with a UTI and he is now on Keflex. He has a history of cellulitis which is been recurrent and difficult but this is been in the left leg, in the past 5 use doxycycline. He does in and out catheterizations at home  which are risk factors for UTI 09/08/17; patient will be completing his Keflex this weekend. The erythema on the left leg is considerably better. He has a new wound today on the medial part of the right leg small superficial almost looks like a skin tear. He has worsening of the area on the right dorsal first and  second toe. His major area on the left lateral leg is better. Using Hydrofera Blue on all areas 09/15/17; gradual reduction in width on the long wound in the left lateral leg. No debridement required. He also has wounds on the plantar aspect of his left first second toe web space and on the dorsal aspect of the right first second toe web space. 09/22/17; there continues to be very gradual improvements in the dimensions of the left lateral leg wound. He hasn't round erythematous spot with might be pressure on his wheelchair. There is no evidence obviously of infection no purulence no warmth ooHe has a dry scaled area on the plantar aspect of the left first second toe ooImproved area on the dorsal right first second toe. 09/29/17; left lateral leg wound continues to improve in dimensions mostly with an is still a fairly long but increasingly narrow wound. ooHe has a dry scaled area on the plantar aspect of his left first second toe web space ooIncreasingly concerning area on the dorsal right first second toe. In fact I am concerned today about possible cellulitis around this wound. The areas extending up his second toe and although there is deformities here almost appears to abut on the nailbed. 10/06/17; left lateral leg wound continues to make very gradual progress. Tissue culture I did from the right first second toe dorsal foot last time grew MRSA and enterococcus which was vancomycin sensitive. This was not sensitive to clindamycin or doxycycline. He is allergic to Zyvox and sulfa we have therefore arrange for him to have dalvance infusion tomorrow. He is had this in the past and tolerated it well 10/20/17; left lateral leg wound continues to make decent progress. This is certainly reduced in terms of with there is advancing epithelialization.ooThe cellulitis in the right foot looks better although he still has a deep wound in the dorsal aspect of the first second toe web space. Plantar left  first toe web space on the left I think is making some progress 10/27/17; left lateral leg wound continues to make decent progress. Advancing epithelialization.using Hydrofera Blue ooThe right first second toe web space wound is better-looking using silver alginate ooImprovement in the left plantar first second toe web space. Again using silver alginate 11/03/17 left lateral leg wound continues to make decent progress albeit slowly. Using Hydrofera Blue ooThe right per second toe web space continues to be a very problematic looking punched out wound. I obtained a piece of tissue for deep culture I did extensively treated this for fungus. It is difficult to imagine that this is a pressure area as the patient states other than going outside he doesn't really wear shoes at home ooThe left plantar first second toe web space looked fairly senescent. Necrotic edges. This required debridement oochange to Hydrofera Blue to all wound areas 11/10/17; left lateral leg wound continues to contract. Using Hydrofera Blue ooOn the right dorsal first second toe web space dorsally. Culture I did of this area last week grew MRSA there is not an easy oral option in this patient was multiple antibiotic allergies or intolerances. This was only a rare culture isolate I'm therefore going to  use Bactroban under silver alginate ooOn the left plantar first second toe web space. Debridement is required here. This is also unchanged 11/17/17; left lateral leg wound continues to contract using Hydrofera Blue this is no longer the major issue. ooThe major concern here is the right first second toe web space. He now has an open area going from dorsally to the plantar aspect. There is now wound on the inner lateral part of the first toe. Not a very viable surface on this. There is erythema spreading medially into the forefoot. ooNo major change in the left first second toe plantar wound 11/24/17; left lateral leg wound  continues to contract using Hydrofera Blue. Nice improvement today ooThe right first second toe web space all of this looks a lot less angry than last week. I have given him clindamycin and topical Bactroban for MRSA and terbinafine for the possibility of underlining tinea pedis that I could not control with ketoconazole. Looks somewhat better ooThe area on the plantar left first second toe web space is weeping with dried debris around the wound 12/01/17; left lateral leg wound continues to contract he Hydrofera Blue. It is becoming thinner in terms of with nevertheless it is making good improvement. ooThe right first second toe web space looks less angry but still a large necrotic-looking wounds starting on the plantar aspect of the right foot extending between the toes and now extensively on the base of the right second toe. I gave him clindamycin and topical Bactroban for MRSA anterior benefiting for the possibility of underlying tinea pedis. Not looking better today ooThe area on the left first/second toe looks better. Debrided of necrotic debris 12/05/17* the patient was worked in urgently today because over the weekend he found blood on his incontinence bad when he woke up. He was found to have an ulcer by his wife who does most of his wound care. He came in today for [place] to look at this. He has not had a history of wounds in his buttocks in spite of his paraplegia. 12/08/17; seen in follow-up today at his usual appointment. He was seen earlier this week and found to have a new wound on his buttock. We also follow him for wounds on the left lateral leg, left first second toe web space and right first second toe web space 12/15/17; we have been using Hydrofera Blue to the left lateral leg which has improved. The right first second toe web space has also improved. Left first second toe web space plantar aspect looks stable. The left buttock has worsened using Santyl. Apparently the buttock has  drainage 12/22/17; we have been using Hydrofera Blue to the left lateral leg which continues to improve now 2 small wounds separated by normal skin. He tells [place] he had a fever up to 100 yesterday he is prone to UTIs but has not noted anything different. He does in and out catheterizations. The area between the first and second toes today does not look good necrotic surface covered with what looks to be purulent drainage and erythema extending into the third toe. I had gotten this to something that I thought look better last time however it is not look good today. He also has a necrotic surface over the buttock wound which is expanded. I thought there might be infection under here so I removed a lot of the surface with a #5 curet though nothing look like it really needed culturing. He is been using Santyl to this area 12/27/17;  his original wound on the left lateral leg continues to improve using Hydrofera Blue. I gave him samples of Baxdella although he was unable to take them out of fear for an allergic reaction ["lump in his throat"].the culture I did of the purulent drainage from his second toe last week showed both enterococcus and a set Enterobacter I was also concerned about the erythema on the bottom of his foot although paradoxically although this looks somewhat better today. 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 We've been using silver alginate here. They came up today that he sleeps in a recliner, I'm not sure why but I asked him to stop this 01/03/18; his original wound we've been using Hydrofera Blue is now separated into 2 areas. ooUlcer on his left buttock is better he is off the recliner and sleeping in bed ooFinally both wound areas between his first and second toes also looks some better 01/10/18; his original wound on the left lateral leg is now separated into 2 wounds we've been using  Hydrofera Blue ooUlcer on his left buttock has some drainage. There is a small probing site going into muscle layer superiorly.using silver alginate -He arrives today with a deep tissue injury on the left heel ooThe wound on the dorsal aspect of his first second toe on the left looks a lot betterusing silver alginate ketoconazole ooThe area on the first second toe web space on the right also looks a lot bette 01/17/18; his original wound on the left lateral leg continues to progress using Hydrofera Blue ooUlcer on his left buttock also is smaller surface healthier except for a small probing site going into the muscle layer superiorly. 2.4 cm of tunneling in this area ooDTI on his left heel we have only been offloading. Looks better than last week no threatened open no evidence of infection oothe wound on the dorsal aspect of the first second toe on the left continues to look like it's regressing we have only been using silver alginate and terbinafine orally ooThe area in the first second toe web space on the right also looks to be a lot better using silver alginate and terbinafine I think this was prompted by tinea pedis 01/31/18; the patient was hospitalized in [place] last week apparently for a complicated UTI. He was discharged on cefepime he does in and out catheterizations. In the hospital he was discovered M I don't mild elevation of AST and ALT and the terbinafine was stopped.predictably the pressure ulcer on s his buttock looks betterusing silver alginate. The area on the left lateral leg also is better using Hydrofera Blue. The area between the first and second toes on the left better. First and second toes on the right still substantial but better. Finally the DTI on the left heel has held together and looks like it's resolving 02/07/18-he is here in follow-up evaluation for multiple ulcerations. He has new injury to the lateral aspect of the last issue a pressure ulcer, he states  this is from adhesive removal trauma. He states he has tried multiple adhesive products with no success. All other ulcers appear stable. The left heel DTI is resolving. We will continue with same treatment plan and follow-up next week. 02/14/18; follow-up for multiple areas. ooHe has a new area last week on the lateral aspect of his pressure ulcer more over the posterior trochanter. The original pressure ulcer looks quite stable has healthy  granulation. 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 Almost fully epithelialized on the original superior area using Hydrofera Blue ooDTI on the left heel has peeled off this week to reveal a small superficial wound under denuded skin and subcutaneous tissue ooBoth areas between the first and second toes look better including nothing open on the left 02/21/18; ooThe patient's wounds on his left ischial tuberosity and posterior left greater trochanter actually looked better. He has a large area of irritation around the area which I think is contact dermatitis. I am doubtful that this is fungal ooHis original wound on the left lateral calf continues to improve we have been using Hydrofera Blue ooThere is no open area in the left first second toe web space although there is a lot of thick callus ooThe DTI on the left heel required debridement today of necrotic surface eschar and subcutaneous tissue using silver alginate ooFinally the area on the right first second toe webspace continues to contract using silver alginate and ketoconazole 02/28/18 ooLeft ischial tuberosity wounds look better using silver alginate. ooOriginal wound on the left calf only has one small open area left using Hydrofera Blue ooDTI on the left heel required debridement mostly removing skin from around this wound surface. Using silver alginate ooThe areas on the right  first/second toe web space using silver alginate and ketoconazole 03/08/18 on evaluation today patient appears to be doing decently well as best I can tell in regard to his wounds. This is the first time that I have seen him as he generally is followed by Dr. Dellia Nims. With that being said none of his wounds appear to be infected he does have an area where there is some skin covering what appears to be a new wound on the left dorsal surface of his great toe. This is right at the nail bed. With that being said I do believe that debrided away some of the excess skin can be of benefit in this regard. Otherwise he has been tolerating the dressing changes without complication. 03/14/18; patient arrives today with the multiplicity of wounds that we are following. He has not been systemically unwell ooOriginal wound on the left lateral calf now only has 2 small open areas we've been using Hydrofera Blue which should continue ooThe deep tissue injury on the left heel requires debridement today. We've been using silver alginate ooThe left first second toe and the right first second toe are both are reminiscence what I think was tinea pedis. Apparently some of the callus Surface between the toes was removed last week when it started draining. ooPurulent drainage coming from the wound on the ischial tuberosity on the left. 03/21/18-He is here in follow-up evaluation for multiple wounds. 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 He tolerated debridement. The only change to last week's recommendations is to discontinue antifungal cream between toes. He will follow-up next week 03/28/18; following up for multiple wounds;Concern this week is streaking redness and swelling in the right foot. He is going to need antibiotics for this. 03/31/18; follow-up for right foot cellulitis. Streaking redness and swelling in the right foot on 03/28/18. He has  multiple antibiotic intolerances and a history of MRSA. I put him on clindamycin 300 mg every 6 and brought him in for a quick check. He has an open wound between his first and second toes on  the right foot as a potential source. 04/04/18; ooRight foot cellulitis is resolving he is completing clindamycin. This is truly good news ooLeft lateral calf wound which is initial wound only has one small open area inferiorly this is close to healing out. He has compression stockings. We will use Hydrofera Blue right down to the epithelialization of this ooNonviable surface on the left heel which was initially pressure with a DTI. We've been using Hydrofera Blue. I'm going to switch this back to silver alginate ooLeft first second toe/tinea pedis this looks better using silver alginate ooRight first second toe tinea pedis using silver alginate ooLarge pressure ulcers on theLeft ischial tuberosity. Small wound here Looks better. I am uncertain about the surface over the large wound. Using silver alginate 04/11/18; ooCellulitis in the right foot is resolved ooLeft lateral calf wound which was his original wounds still has 2 tiny open areas remaining this is just about closed ooNonviable surface on the left heel is better but still requires debridement ooLeft first second toe/tinea pedis still open using silver alginate ooRight first second toe wound tinea pedis I asked him to go back to using ketoconazole and silver alginate ooLarge pressure ulcers on the left ischial tuberosity this shear injury here is resolved. Wound is smaller. No evidence of infection using silver alginate 04/18/18; ooPatient arrives with an intense area of cellulitis in the right mid lower calf extending into the right heel area. Bright red and warm. Smaller area on the left anterior leg. He has a significant history of MRSA. He will definitely need antibioticsoodoxycycline ooHe now has 2 open areas on the left ischial  tuberosity the original large wound and now a satellite area which I think was above his initial satellite areas. Not a wonderful surface on this satellite area surrounding erythema which looks like pressure related. ooHis left lateral calf wound again his original wound is just about closed ooLeft heel pressure injury still requiring debridement ooLeft first second toe looks a lot better using silver alginate ooRight first second toe also using silver alginate and ketoconazole cream also looks better 04/20/18; the patient was worked in early today out of concerns with his cellulitis on the right leg. I had started him on doxycycline. This was 2 days ago. His wife was concerned about the swelling in the area. Also concerned about the left buttock. He has not been systemically unwell no fever chills. No nausea vomiting or diarrhea 04/25/18; the patient's left buttock wound is continued to deteriorate he is using Hydrofera Blue. He is still completing clindamycin for the cellulitis on the right leg although all of this looks better. 05/02/18 ooLeft buttock wound still with a lot of drainage and a very tightly adherent fibrinous necrotic surface. He has a deeper area superiorly ooThe left lateral calf wound is still closed ooDTI wound on the left heel necrotic surface especially the circumference using Iodoflex ooAreas between his left first second toe and right first second toe both look better. Dorsally and the right first second toe he had a necrotic surface although at smaller. In using silver alginate and ketoconazole. I did a culture last week which was a deep tissue culture of the reminiscence of the open wound on the right first second toe dorsally. This grew a few Acinetobacter and a few methicillin-resistant staph aureus. Nevertheless the area actually this week looked better. I didn't feel the need to specifically address this at least in terms of systemic antibiotics. 05/09/18; wounds  are measuring larger more  drainage per our intake. We are using Santyl covered with alginate on the large superficial buttock wounds, Iodosorb on the left heel, ketoconazole and silver alginate to the dorsal first and second toes bilaterally. 05/16/18; ooThe area on his left buttock better in some aspects although the area superiorly over the ischial tuberosity required an extensive debridement.using Santyl ooLeft heel appears stable. Using Iodoflex ooThe areas between his first and second toes are not bad however there is spreading erythema up the dorsal aspect of his left foot this looks like cellulitis again. He is insensate the erythema is really very brilliant.o Erysipelas He went to see an allergist days ago because he was itching part of this he had lab work done. This showed a white count of 15.1 with 70% neutrophils. Hemoglobin of 11.4 and a platelet count of 659,000. Last white count we had in Epic was a 2-1/2 years ago which was 25.9 but he was ill at the time. He was able to show me some lab work that was done by his primary physician the pattern is about the same. 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 But prompted me to go ahead and do x-rays of both feet and the pelvis rule out osteomyelitis. He also had a comprehensive metabolic panel this was reasonably normal his albumin was 3.7 liver function tests BUN/creatinine all normal 05/23/18; x-rays of both his feet from last week were negative for underlying pulmonary abnormality. The x-ray of his pelvis however showed mild irregularity in the left ischial which may represent some early osteomyelitis. The wound in the left ischial continues to get deeper clearly now exposed muscle. Each week necrotic surface material over this area. Whereas the rest of the wounds do not look so bad. ooThe left ischial wound we have been using Santyl and calcium alginate ooT the left heel surface necrotic  debris using Iodoflex o ooThe left lateral leg is still healed ooAreas on the left dorsal foot and the right dorsal foot are about the same. There is some inflammation on the left which might represent contact dermatitis, fungal dermatitis I am doubtful cellulitis although this looks better than last week 05/30/18; CT scan done at Hospital did not show any osteomyelitis or abscess. Suggested the possibility of underlying cellulitis although I don't see a lot of evidence of this at the bedside ooThe wound itself on the left buttock/upper thigh actually looks somewhat better. No debridement ooLeft heel also looks better no debridement continue Iodoflex ooBoth dorsal first second toe spaces appear better using Lotrisone. Left still required debridement 06/06/18; ooIntake reported some purulent looking drainage from the left gluteal wound. Using Santyl and calcium alginate ooLeft heel looks better although still a nonviable surface requiring debridement ooThe left dorsal foot first/second webspace actually expanding and somewhat deeper. I may consider doing a shave biopsy of this area ooRight dorsal foot first/second webspace appears stable to improved. Using Lotrisone and silver alginate to both these areas 06/13/18 ooLeft gluteal surface looks better. Now separated in the 2 wounds. No debridement required. Still drainage. We'll continue silver alginate ooLeft heel continues to look better with Iodoflex continue this for at least another week ooOf his dorsal foot wounds the area on the left still has some depth although it looks better than last week. We've been using Lotrisone and silver alginate 06/20/18 ooLeft gluteal continues to look better healthy tissue ooLeft heel continues to look better healthy granulation wound is smaller. He is  using Iodoflex and his long as this continues continue the Iodoflex ooDorsal right foot looks better unfortunately dorsal left foot does not. There is  swelling and erythema of his forefoot. He had minor trauma to this several days ago but doesn't think this was enough to have caused any tissue injury. Foot looks like cellulitis, we have had this problem before 06/27/18 on evaluation today patient appears to be doing a little worse in regard to his foot ulcer. 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 Both of which would really be his only options for treating this infection. In the past he has been given and effusion of Orbactiv. This is done very well for him in the past again it's one time dosing IV antibiotic therapy. Subsequently I do believe this is something we're gonna need to see about doing at this point in time. Currently his other wounds seem to be doing somewhat better in my pinion I'm pretty happy in that regard. 07/03/18 on evaluation today patient's wounds actually appear to be doing fairly well. He has been tolerating the dressing changes without complication. All in all he seems to be showing signs of improvement. In regard to the antibiotics he has been dealing with infectious disease since I saw him last week as far as getting this scheduled. In the end [facility]'s [facility] this coming Friday. In the meantime he has been continuing to perform the dressing changes in such as previous. There does not appear to be any evidence of infection worsengin at this time. 07/10/18; ooSince I last saw this man 2 weeks ago things have actually improved. IV antibiotics of resulted in less forefoot erythema although there is still some present. He is not systemically unwell ooLeft buttock wounds o2 now have no depth there is increased epithelialization Using silver alginate ooLeft heel still requires debridement using Iodoflex ooLeft dorsal foot still  with a sizable wound about the size of a border but healthy granulation ooRight dorsal foot still with a slitlike area using silver alginate 07/18/18; the patient's cellulitis in the left foot is improved in fact I think it is on its way to resolving. ooLeft buttock wounds o2 both look better although the larger one has hypertension granulation we've been using silver alginate ooLeft heel has some thick circumferential redundant skin over the wound edge which will need to be removed today we've been using Iodoflex ooLeft dorsal foot is still a sizable wound required debridement using silver alginate ooThe right dorsal foot is just about closed only a small open area remains here 07/25/18; left foot cellulitis is resolved ooLeft buttock wounds o2 both look better. Hyper-granulation on the major area ooLeft heel as some debris over the surface but otherwise looks a healthier wound. Using silver collagen ooRight dorsal foot is just about closed 07/31/18; arrives with our intake nurse worried about purulent drainage from the buttock. We had hyper-granulation here last week ooHis buttock wounds o2 continue to look better ooLeft heel some debris over the surface but measuring smaller. ooRight dorsal foot unfortunately has openings between the toes ooLeft foot superficial wound looks less aggravated. 08/07/18 ooButtock wounds continue to look better although some of her granulation and the larger medial wound. silver alginate ooLeft heel continues to look a lot better.silver collagen ooLeft foot superficial  wound looks less stable. Requires debridement. He has a new wound superficial area on the foot on the lateral dorsal foot. ooRight foot looks better using silver alginate without Lotrisone 08/14/2018; patient was in the ER last week diagnosed with a UTI. He is now on Cefpodoxime and Macrodantin. ooButtock wounds continued to be smaller. Using silver alginate ooLeft heel continues to  look better using silver collagen ooLeft foot superficial wound looks as though it is improving ooRight dorsal foot area is just about healed. 08/21/2018; patient is completed his antibiotics for his UTI. ooHe has 2 open areas on the buttocks. There is still not closed although the surface looks satisfactory. Using silver alginate ooLeft heel continues to improve using silver collagen ooThe bilateral dorsal foot areas which are at the base of his first and second toes/possible tinea pedis are actually stable on the left but worse on the right. The area on the left required debridement of necrotic surface. After debridement I obtained a specimen for PCR culture. ooThe right dorsal foot which is been just about healed last week is now reopened 08/28/2018; culture done on the left dorsal foot showed coag negative staph both staph epidermidis and Lugdunensis. I think this is worthwhile initiating systemic treatment. I will use doxycycline given his long list of allergies. The area on the left heel slightly improved but still requiring debridement. ooThe large wound on the buttock is just about closed whereas the smaller one is larger. Using silver alginate in this area 09/04/2018; patient is completing his doxycycline for the left foot although this continues to be a very difficult wound area with very adherent necrotic debris. We are using silver alginate to all his wounds right foot left foot and the small wounds on his buttock, silver collagen on the left heel. 09/11/2018; once again this patient has intense erythema and swelling of the left forefoot. Lesser degrees of erythema in the right foot. He has a long list of allergies and intolerances. I will reinstitute doxycycline. 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 Using silver alginate ooLeft heel also looks better using silver collagen ooUnfortunately both the areas on his feet look  worse. The area on the left first second webspace is now gone through to the plantar part of his foot. The area on the left foot anteriorly is irritated with erythema and swelling in the forefoot. 09/25/2018 ooHis wound on the left plantar heel looks better. Using silver collagen ooThe area on the left buttock 2 small remnant areas. One is closed one is still open. Using silver alginate ooThe areas between both his first and second toes look worse. This in spite of long-standing antifungal therapy with ketoconazole and silver alginate which should have antifungal activity ooHe has small areas around his original wound on the left calf one is on the bottom of the original scar tissue and one superiorly both of these are small and superficial but again given wound history in this site this is worrisome 10/02/2018 ooLeft plantar heel continues to gradually contract using silver collagen ooLeft buttock wound is unchanged using silver alginate ooThe areas on his dorsal feet between his first and second toes bilaterally look about the same. 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 Small open area here. We have been using silver  alginate 10/09/2018; ooLeft plantar heel continues to look healthy and contract. No debridement is required ooLeft buttock slightly smaller with a tape injury wound just below which was new this week ooDorsal feet somewhat improved I have been using clindamycin ooLeft lateral looks lower extremity the actual open area looks worse although a lot of this is epithelialized. I am going to change to silver collagen today He has a lot more swelling in the right leg although this is not pitting not red and not particularly warm there is a lot of spasm in the right leg usually indicative of  people with paralysis of some underlying discomfort. We have reviewed his vascular status from 2017 he had a left greater saphenous vein ablation. I wonder about referring him back to vascular surgery if the area on the left leg continues to deteriorate. 10/16/2018 in today for follow-up and management of multiple lower extremity ulcers. His left Buttock wound is much lower smaller and almost closed completely. The wound to the left ankle has began to reopen with Epithelialization and some adherent slough. He has multiple new areas to the left foot and leg. The left dorsal foot without much improvement. Wound present between left great webspace and 2nd toe. Erythema and edema present right leg. Right LE ultrasound obtained on 10/10/18 was negative for DVT . 10/23/2018; ooLeft buttock is closed over. Still dry macerated skin but there is no open wound. I suspect this is chronic pressure/moisture ooLeft lateral calf is quite a bit worse than when I saw this last. There is clearly drainage here he has macerated skin into the left plantar heel. We will change the primary dressing to alginate ooLeft dorsal foot has some improvement in overall wound area. Still using clindamycin and silver alginate ooRight dorsal foot about the same as the left using clindamycin and silver alginate ooThe erythema in the right leg has resolved. He is DVT rule out was negative ooLeft heel pressure area required debridement although the wound is smaller and the surface is health 10/26/2018 ooThe patient came back in for his nurse check today predominantly because of the drainage coming out of the left lateral leg with a recent reopening of his original wound on the left lateral calf. He comes in today with a large amount of surrounding erythema around the wound extending from the calf into the ankle and even in the area on the dorsal foot. He is not systemically unwell. He is not febrile. Nevertheless this looks like  cellulitis. We have been using silver alginate to the area. I changed him to a regular visit and I am going to prescribe him doxycycline. The rationale here is a long list of medication intolerances and a history of MRSA. I did not see anything that I thought would provide a valuable culture 10/30/2018 ooFollow-up from his appointment 4 days ago with really an extensive area of cellulitis in the left calf left lateral ankle and left dorsal foot. I put him on doxycycline. He has a long list of medication allergies which are true allergy reactions. 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 In any case he is a lot better today. The erythema especially in the anterior and lateral left calf is better. He still has left ankle erythema. He also is complaining about increasing edema in the right leg we have only been using Kerlix Coban and he has been doing the wraps at home. Finally he has a  spotty rash on the medial part of his upper left calf which looks like folliculitis or perhaps wrap occlusion type injury. Small superficial macules not pustules 11/06/18 patient arrives today with again a considerable degree of erythema around the wound on the left lateral calf extending into the dorsal ankle and dorsal foot. This is a lot worse than when I saw this last week. He is on doxycycline really with not a lot of improvement. He has not been systemically unwell Wounds on the; left heel actually looks improved. Original area on the left foot and proximity to the first and second toes looks about the same. He has superficial areas on the dorsal foot, anterior calf and then the reopening of his original wound on the left lateral calf which looks about the same ooThe only area he has on the right is the dorsal webspace first and second which is smaller. ooHe has a large area of dry erythematous skin on the left buttock small open area  here. 11/13/2018; the patient arrives in much better condition. The erythema around the wound on the left lateral calf is a lot better. Not sure whether this was the clindamycin or the TCA and ketoconazole or just in the improvement in edema control [stasis dermatitis]. In any case this is a lot better. The area on the left heel is very small and just about resolved using silver collagen we have been using silver alginate to the areas on his dorsal feet 11/20/2018; his wounds include the left lateral calf, left heel, dorsal aspects of both feet just proximal to the first second webspace. He is stable to slightly improved. I did not think any changes to his dressings were going to be necessary 11/27/2018 he has a reopening on the left buttock which is surrounded by what looks like tinea or perhaps some other form of dermatitis. The area on the left dorsal foot has some erythema around it I have marked this area but I am not sure whether this is cellulitis or not. Left heel is not closed. Left calf the reopening is really slightly longer and probably worse 1/13; in general things look better and smaller except for the left dorsal foot. Area on the left heel is just about closed, left buttock looks better only a small wound remains in the skin looks better [using Lotrisone] 1/20; the area on the left heel only has a few remaining open areas here. Left lateral calf about the same in terms of size, left dorsal foot slightly larger right lateral foot still not closed. The area on the left buttock has no open wound and the surrounding skin looks a lot better 1/27; the area on the left heel is closed. Left lateral calf better but still requiring extensive debridements. The area on his left buttock is closed. He still has the open areas on the left dorsal foot which is slightly smaller in the right foot which is slightly expanded. We have been using Iodoflex on these areas as well 2/3; left heel is closed. Left  lateral calf still requiring debridement using Iodoflex there is no open area on his left buttock however he has dry scaly skin over a large area of this. Not really responding well to the Lotrisone. Finally the areas on his dorsal feet at the level of the first second webspace are slightly smaller on the right and about the same on the left. Both of these vigorously debrided with Anasept and gauze 2/10; left heel remains closed he  has dry erythematous skin over the left buttock but there is no open wound here. Left lateral leg has come in and with. Still requiring debridement we have been using Iodoflex here. Finally the area on the left dorsal foot and right dorsal foot are really about the same extremely dry callused fissured areas. He does not yet have a dermatology appointment 2/17; left heel remains closed. He has a new open area on the left buttock. The area on the left lateral calf is bigger longer and still covered in necrotic debris. No major change in his foot areas bilaterally. I am awaiting for a dermatologist to look on this. We have been using ketoconazole I do not know that this is been doing any good at all. 2/24; left heel remains closed. The left buttock wound that was new reopening last week looks better. The left lateral calf appears better also although still requires debridement. The major area on his foot is the left first second also requiring debridement. We have been putting Prisma on all wounds. I do not believe that the ketoconazole has done too much good for his feet. He will use Lotrisone I am going to give him a 2-week course of terbinafine. We still do not have a dermatology appointment 3/2 left heel remains closed however there is skin over bone in this area I pointed this out to him today. The left buttock wound is epithelialized but still does not look completely stable. The area on the left leg required debridement were using silver collagen here. With regards to  his feet we changed to Lotrisone last week and silver alginate. 3/9; left heel remains closed. Left buttock remains closed. The area on the right foot is essentially closed. The left foot remains unchanged. Slightly smaller on the left lateral calf. Using silver collagen to both of these areas 3/16-Left heel remains closed. Area on right foot is closed. Left lateral calf above the lateral malleolus open wound requiring debridement with easy bleeding. Left dorsal wound proximal to first toe also debrided. Left ischial area open new. Patient has been using Prisma with wrapping every 3 days. Dermatology appointment is apparently tomorrow.Patient has completed his terbinafine 2-week course with some apparent improvement according to him, there is still flaking and dry skin in his foot on the left 3/23; area on the right foot is reopened. The area on the left anterior foot is about the same still a very necrotic adherent surface. He still has the area on the left leg and reopening is on the left buttock. He apparently saw dermatology although I do not have a note. According to the patient who is usually fairly well informed they did not have any good ideas. Put him on oral terbinafine which she is been on before. 3/30; using silver collagen to all wounds. Apparently his dermatologist put him on doxycycline and rifampin presumably some culture grew staph. I do not have this result. He remains on terbinafine although I have used terbinafine on him before 4/6; patient has had a fairly substantial reopening on the right foot between the first and second toes. He is finished his terbinafine and I believe is on doxycycline and rifampin still as prescribed by dermatology. We have been using silver collagen to all his wounds although the patient reports that he thinks silver alginate does better on the wounds on his buttock. 4/13; the area on his left lateral calf about the same size but it did not require  debridement. ooLeft dorsal foot  just proximal to the webspace between the first and second toes is about the same. Still nonviable surface. I note some superficial bronze discoloration of the dorsal part of his foot ooRight dorsal foot just proximal to the first and second toes also looks about the same. I still think there may be the same discoloration I noted above on the left ooLeft buttock wound looks about the same 4/20; left lateral calf appears to be gradually contracting using silver collagen. 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 The left dorsal foot wound is debrided of tightly adherent necrotic debris and really cleans up quite nicely. The right area is worse with expansion. I did not debride this it is now over the base of the second toe ooThe area on his left buttock is smaller no debridement is required using silver collagen 5/4; left calf continues to make good progress. ooHe arrives with erythema around the wounds on his dorsal foot which even extends to the plantar aspect. Very concerning for coexistent infection. He is finished the erythromycin I gave him for possible erythrasma this does not seem to have helped. ooThe area on the left foot is about the same base of the dorsal toes ooIs area on the buttock looks improved on the left 5/11; left calf and left buttock continued to make good progress. Left foot is about the same to slightly improved. ooMajor problem is on the right foot. He has not had an x-ray. Deep tissue culture I did last week showed both Enterobacter and E. coli. I did not change the doxycycline I put him on empirically although neither 1 of these were plated to doxycycline. He arrives today with the erythema looking worse on both the dorsal and plantar foot. Macerated skin on the bottom of the foot. he has not been systemically unwell 5/18-Patient returns at 1 week, left calf wound appears  to be making some progress, left buttock wound appears slightly worse than last time, left foot wound looks slightly better, right foot redness is marginally better. X-ray of both feet show no air or evidence of osteomyelitis. Patient is finished his Omnicef and terbinafine. He continues to have macerated skin on the bottom of the left foot as well as right 5/26; left calf wound is better, left buttock wound appears to have multiple small superficial open areas with surrounding macerated skin. X-rays that I did last time showed no evidence of osteomyelitis in either foot. He is finished cefdinir and doxycycline. I do not think that he was on terbinafine. He continues to have a large superficial open area on the right foot anterior dorsal and slightly between the first and second toes. I did send him to dermatology 2 months ago or so wondering about whether they would do a fungal scraping. I do not believe they did but did do a culture. We have been using silver alginate to the toe areas, he has been using antifungals at home topically either ketoconazole or Lotrisone. We are using silver collagen on the left foot, silver alginate on the right, silver collagen on the left lateral leg and silver alginate on the left buttock 6/1; left buttock area is healed. We have the left dorsal foot, left lateral leg and right dorsal foot. We are using silver alginate to the areas on both feet and silver collagen to the area on his left lateral calf 6/8; the left buttock apparently reopened late last week. He is not really sure how this  happened. He is tolerating the terbinafine. Using silver alginate to all wounds 6/15; left buttock wound is larger than last week but still superficial. ooCame in the clinic today with a report of purulence from the left lateral leg I did not identify any infection ooBoth areas on his dorsal feet appear to be better. He is tolerating the terbinafine. Using silver alginate to all  wounds 6/22; left buttock is about the same this week, left calf quite a bit better. His left foot is about the same however he comes in with erythema and warmth in the right forefoot once again. Culture that I gave him in the beginning of May showed Enterobacter and E. coli. I gave him doxycycline and things seem to improve although neither 1 of these organisms was specifically plated. 6/29; left buttock is larger and dry this week. Left lateral calf looks to me to be improved. Left dorsal foot also somewhat improved right foot completely unchanged. The erythema on the right foot is still present. He is completing the Ceftin dinner that I gave him empirically [see discussion above.) 7/6 - All wounds look to be stable and perhaps improved, the left buttock wound is slightly smaller, per patient bleeds easily, completed ceftin, the right foot redness is less, he is on terbinafine 7/13; left buttock wound about the same perhaps slightly narrower. Area on the left lateral leg continues to narrow. Left dorsal foot slightly smaller right foot about the same. We are using silver alginate on the right foot and Hydrofera Blue to the areas on the left. Unna boot on the left 2 layer compression on the right 7/20; left buttock wound absolutely the same. Area on lateral leg continues to get better. Left dorsal foot require debridement as did the right no major change in the 7/27; left buttock wound the same size necrotic debris over the surface. The area on the lateral leg is closed once again. His left foot looks better right foot about the same although there is some involvement now of the posterior first second toe area. He is still on terbinafine which I have given him for a month, not certain a centimeter major change 06/25/19-All wounds appear to be slightly improved according to report, left buttock wound looks clean, both foot wounds have minimal to no debris the right dorsal foot has minimal slough. We  are using Hydrofera Blue to the left and silver alginate to the right foot and ischial wound. 8/10-Wounds all appear to be around the same, the right forefoot distal part has some redness which was not there before, however the wound looks clean and small. Ischial wound looks about the same with no changes 8/17; his wound on the left lateral calf which was his original chronic venous insufficiency wound remains closed. Since I last saw him the areas on the left dorsal foot right dorsal foot generally appear better but require debridement. The area on his left initial tuberosity appears somewhat larger to me perhaps hyper granulated and bleeds very easily. We have been using Hydrofera Blue to the left dorsal foot and silver alginate to everything else 8/24; left lateral calf remains closed. The areas on his dorsal feet on the webspace of the first and second toes bilaterally both look better. The area on the left buttock which is the pressure ulcer stage II slightly smaller. I change the dressing to Hydrofera Blue to all areas 8/31; left lateral calf remains closed. The area on his dorsal feet bilaterally look better. Using [facility]  Blue. Still requiring debridement on the left foot. No change in the left buttock pressure ulcers however 9/14; left lateral calf remains closed. Dorsal feet look quite a bit better than 2 weeks ago. Flaking dry skin also a lot better with the ammonium lactate I gave him 2 weeks ago. The area on the left buttock is improved. He states that his Roho cushion developed a leak and he is getting a new one, in the interim he is offloading this vigorously 9/21; left calf remains closed. Left heel which was a possible DTI looks better this week. He had macerated tissue around the left dorsal foot right foot looks satisfactory and improved left buttock wound. I changed his dressings to his feet to silver alginate bilaterally. Continuing Hydrofera Blue on the left buttock. 9/28  left calf remains closed. Left heel did not develop anything [possible DTI] dry flaking skin on the left dorsal foot. Right foot looks satisfactory. Improved left buttock wound. We are using silver alginate on his feet Hydrofera Blue on the buttock. I have asked him to go back to the Lotrisone on his feet including the wounds and surrounding areas 10/5; left calf remains closed. The areas on the left and right feet about the same. A lot of this is epithelialized however debris over the remaining open areas. He is using Lotrisone and silver alginate. The area on the left buttock using Hydrofera Blue 10/26. Patient has been out for 3 weeks secondary to Covid concerns. He tested negative but I think his wife tested positive. He comes in today with the left foot substantially worse, right foot about the same. Even more concerning he states that the area on his left buttock closed over but then reopened and is considerably deeper in one aspect than it was before [stage III wound] 11/2; left foot really about the same as last week. Quarter sized wound on the dorsal foot just proximal to the first second toes. Surrounding erythema with areas of denuded epithelium. This is not really much different looking. Did not look like cellulitis this time however. ooRight foot area about the same.. We have been using silver alginate alginate on his toes ooLeft buttock still substantial irritated skin around the wound which I think looks somewhat better. We have been using Hydrofera Blue here. 11/9; left foot larger than last week and a very necrotic surface. Right foot I think is about the same perhaps slightly smaller. Debris around the circumference also addressed. Unfortunately on the left buttock there is been a decline. Satellite lesions below the major wound distally and now a an additional one posteriorly we have been using Hydrofera Blue but I think this is a pressure issue 11/16; left foot ulcer dorsally  again a very adherent necrotic surface. Right foot is about the same. Not much change in the pressure ulcer on his left buttock. 11/30; left foot ulcer dorsally basically the same as when I saw him 2 weeks ago. Very adherent fibrinous debris on the wound surface. Patient reports a lot of drainage as well. 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 We have been using Iodoflex, patient changed back to alginate because of the drainage. Area on his right dorsal foot really looks benign with a healthier surface certainly a lot better than on the left. Left buttock wounds all improved using Hydrofera Blue 12/7; left dorsal foot again no improvement. Tightly adherent debris. PCR culture I did last week only showed likely skin contaminant.  I have gone ahead and done a punch biopsy of this which is about the last thing in terms of investigations I can think to do. He has known venous insufficiency and venous hypertension and this could be the issue here. The area on the right foot is about the same left buttock slightly worse according to our intake nurse secondary to [facility] Blue sticking to the wound 12/14; biopsy of the left foot that I did last time showed changes that could be related to wound healing/chronic stasis dermatitis phenomenon no neoplasm. We have been using silver alginate to both feet. I change the one on the left today to Sorbact and silver alginate to his other 2 wounds 12/28; the patient arrives with the following problems; ooMajor issue is the dorsal left foot which continues to be a larger deeper wound area. Still with a completely nonviable surface ooParadoxically the area mirror image on the right on the right dorsal foot appears to be getting better. ooHe had some loss of dry denuded skin from the lower part of his original wound on the left lateral calf. Some of this area looked a little vulnerable and for this reason we put him  in wrap that on this side this week ooThe area on his left buttock is larger. He still has the erythematous circular area which I think is a combination of pressure, sweat. This does not look like cellulitis or fungal dermatitis 11/26/2019; -Dorsal left foot large open wound with depth. Still debris over the surface. Using Sorbact ooThe area on the dorsal right foot paradoxically has closed over [facility] has a reopening on the left ankle laterally at the base of his original wound that extended up into the calf. This appears clean. ooThe left buttock wound is smaller but with very adherent necrotic debris over the surface. We have been using silver alginate here as well The patient had arterial studies done in 2017. He had biphasic waveforms at the dorsalis pedis and posterior tibial bilaterally. ABI in the left was 1.17. Digit waveforms were dampened. He has slight spasticity in the great toes I do not think a TBI would be possible 1/11; the patient comes in today with a sizable reopening between the first and second toes on the right. This is not exactly in the same location where we have been treating wounds previously. According to our intake nurse this was actually fairly deep but 0.6 cm. The area on the left dorsal foot looks about the same the surface is somewhat cleaner using Sorbact, his MRI is in 2 days. We have not managed yet to get arterial studies. The new reopening on the left lateral calf looks somewhat better using alginate. The left buttock wound is about the same using alginate 1/18; the patient had his ARTERIAL studies which were quite normal. ABI in the right at 1.13 with triphasic/biphasic waveforms on the left ABI 1.06 again with triphasic/biphasic waveforms. It would not have been possible to have done a toe brachial index because of spasticity. We have been using Sorbac to the left foot alginate to the rest of his wounds on the right foot left lateral calf and left  buttock 1/25; arrives in clinic with erythema and swelling of the left forefoot worse over the first MTP area. This extends laterally dorsally and but also posteriorly. Still has an area on the left lateral part of the lower part of his calf wound it is eschared and clearly not closed. ooArea on the left buttock still  with surrounding irritation and erythema. ooRight foot surface wound dorsally. The area between the right and first and second toes appears better. 2/1; ooThe left foot wound is about the same. Erythema slightly better I gave him a week of doxycycline empirically ooRight foot wound is more extensive extending between the toes to the plantar surface ooLeft lateral calf really no open surface on the inferior part of his original wound however the entire area still looks vulnerable ooAbsolutely no improvement in the left buttock wound required debridement. 2/8; the left foot is about the same. Erythema is slightly improved I gave him clindamycin last week. ooRight foot looks better he is using Lotrimin and silver alginate ooHe has a breakdown in the left lateral calf. Denuded epithelium which I have removed ooLeft buttock about the same were using Hydrofera Blue 2/15; left foot is about the same there is less surrounding erythema. Surface still has tightly adherent debris which I have debriding however not making any progress ooRight foot has a substantial wound on the medial right second toe between the first and second webspace. ooStill an open area on the left lateral calf distal area. ooButtock wound is about the same 2/22; left foot is about the same less surrounding erythema. Surface has adherent debris. Polymen Ag Right foot area significant wound between the first and second toes. We have been using silver alginate here Left lateral leg polymen Ag at the base of his original venous insufficiency wound ooLeft buttock some improvement here 3/1; ooRight foot is  deteriorating in the first second toe webspace. Larger and more substantial. We have been using silver alginate. ooLeft dorsal foot about the same markedly adherent surface debris using PolyMem Ag ooLeft lateral calf surface debris using PolyMem AG ooLeft buttock is improved again using PolyMem Ag. ooHe is completing his terbinafine. The erythema in the foot seems better. He has been on this for 2 weeks 3/8; no improvement in any wound area in fact he has a small open area on the dorsal midfoot which is new this week. He has not gotten his foot x-rays yet 3/15; his x-rays were both negative for osteomyelitis of both feet. No major change in any of his wounds on the extremities however his buttock wounds are better. We have been using polymen on the buttocks, left lower leg. Iodoflex on the left foot and silver alginate on the right 3/22; arrives in clinic today with the 2 major issues are the improvement in the left dorsal foot wound which for once actually looks healthy with a nice healthy wound surface without debridement. Using Iodoflex here. Unfortunately on the left lateral calf which is in the distal part of his original wound he came to the clinic here for there was purulent drainage noted some increased breakdown scattered around the original area and a small area proximally. We we are using polymen here will change to silver alginate today. His buttock wound on the left is better and I think the area on the right first second toe webspace is also improved 3/29; left dorsal foot looks better. Using Iodoflex. Left ankle culture from deterioration last time grew E. coli, Enterobacter and Enterococcus. I will give him a course of cefdinir although that will not cover Enterococcus. The area on the right foot in the webspace of the first and second toe lateral first toe looks better. The area on his buttock is about healed Vascular appointment is on April 21. This is to look at his venous  system vis--vis  continued breakdown of the wounds on the left including the left lateral leg and left dorsal foot he. He has had previous ablations on this side 4/5; the area between the right first and second toes lateral aspect of the first toe looks better. Dorsal aspect of the left first toe on the left foot also improved. Unfortunately the left lateral lower leg is larger and there is a second satellite wound superiorly. The usual superficial abrasions on the left buttock overall better but certainly not closed 4/12; the area between the right first and second toes is improved. Dorsal aspect of the left foot also slightly smaller with a vibrant healthy looking surface. No real change in the left lateral leg and the left buttock wound is healed He has an unaffordable co-pay for Apligraf. Appointment with vein and vascular with regards to the left leg venous part of the circulation is on 4/21 4/19; we continue to see improvement in all wound areas. Although this is minor. He has his vascular appointment on 4/21. The area on the left buttock has not reopened although right in the center of this area the skin looks somewhat threatened 4/26; the left buttock is unfortunately reopened. In general his left dorsal foot has a healthy surface and looks somewhat smaller although it was not measured as such. The area between his first and second toe webspace on the right as a small wound against the first toe. The patient saw vascular surgery. The real question I was asking was about the small saphenous vein on the left. He has previously ablated left greater saphenous vein. Nothing further was commented on on the left. Right greater saphenous vein without reflux at the saphenofemoral junction or proximal thigh there was no indication for ablation of the right greater saphenous vein duplex was negative for DVT bilaterally. They did not think there was anything from a vascular surgery point of view that  could be offered. They ABIs within normal limits 5/3; only small open area on the left buttock. The area on the left lateral leg which was his original venous reflux is now 2 wounds both which look clean. We are using Iodoflex on the left dorsal foot which looks healthy and smaller. He is down to a very tiny area between the right first and second toes, using silver alginate 5/10; all of his wounds appear better. We have much better edema control in 4 layer compression on the left. This may be the factor that is allowing the left foot and left lateral calf to heal. He has external compression garments at home 04/14/20-All of his wounds are progressing well, the left forefoot is practically closed, left ischium appears to be about the same, right toe webspace is also smaller. The left lateral leg is about the same, continue using Hydrofera Blue to this, silver alginate to the ischium, Iodoflex to the toe space on the right 6/7; most of his wounds outside of the left buttock are doing well. The area on the left lateral calf and left dorsal foot are smaller. The area on the right foot in between the first and second toe webspace is barely visible although he still says there is some drainage here is the only reason I did not heal this out. ooUnfortunately the area on the left buttock almost looks like he has a skin tear from tape. He has open wound and then a large flap of skin that we are trying to get adherence over an area just next to  the remaining wound 6/21; 2 week follow-up. I believe is been here for nurse visits. Miraculously the area between his first and second toes on the left dorsal foot is closed over. Still open on the right first second web space. The left lateral calf has 2 open areas. Distally this is more superficial. The proximal area had a little more depth and required debridement of adherent necrotic material. His buttock wound is actually larger we have been using silver alginate  here 6/28; the patient's area on the left foot remains closed. Still open wet area between the first and second toes on the right and also extending into the plantar aspect. We have been using silver alginate in this location. He has 2 areas on the left lower leg part of his original long wounds which I think are better. We have been using Hydrofera Blue here. Hydrofera Blue to the left buttock which is stable 7/12; left foot remains closed. Left ankle is closed. May be a small area between his right first and second toes the only truly open area is on the left buttock. We have been using Hydrofera Blue here 7/19; patient arrives with marked deterioration especially in the left foot and ankle. We did not put him in a compression wrap on the left last week in fact he wore his juxta lite stockings on either side although he does not have an underlying stocking. He has a reopening on the left dorsal foot, left lateral ankle and a new area on the right dorsal ankle. More worrisome is the degree of erythema on the left foot extending on the lateral foot into the lateral lower leg on the left 7/26; the patient had erythema and drainage from the lateral left ankle last week. Culture of this grew MRSA resistant to doxycycline and clindamycin which are the 2 antibiotics we usually use with this patient who has multiple antibiotic allergies including linezolid, trimethoprim sulfamethoxazole. I had give him an empiric doxycycline and he comes in the area certainly looks somewhat better although it is blotchy in his lower leg. He has not been systemically unwell. He has had areas on the left dorsal foot which is a reopening, chronic wounds on the left lateral ankle. Both of these I think are secondary to chronic venous insufficiency. The area between his first and second toes is closed as far as I can tell. He had a new wrap injury on the right dorsal ankle last week. Finally he has an area on the left buttock. We  have been using silver alginate to everything except the left buttock we are using Hydrofera Blue 06/30/20-Patient returns at 1 week, has been given a sample dose pack of NUZYRA which is a tetracycline derivative [omadacycline], patient has completed those, we have been using silver alginate to almost all the wounds except the left ischium where we are using Hydrofera Blue all of them look better 8/16; since I last saw the patient he has been doing well. The area on the left buttock, left lateral ankle and left foot are all closed today. He has completed the [place] I gave him last time and tolerated this well. He still has open areas on the right dorsal ankle and in the right first second toe area which we are using silver alginate. 8/23; we put him in his bilateral external compression stockings last week as he did not have anything open on either leg except for concerning area between the right first and second toe. He comes  in today with an area on the left dorsal foot slightly more proximal than the original wound, the left lateral foot but this is actually a continuation of the area he had on the left lateral ankle from last time. As well he is opened up on the left buttock again. 8/30; comes in today with things looking a lot better. The area on the left lower ankle has closed down as has the left foot but with eschar in both areas. The area on the dorsal right ankle is also epithelialized. Very little remaining of the left buttock wound. We have been using silver alginate on all wound areas 9/13; the area in the first second toe webspace on the right has fully epithelialized. He still has some vulnerable epithelium on the right and the ankle and the dorsal foot. He notes weeping. He is using his juxta lite stocking. On the left again the left dorsal foot is closed left lateral ankle is closed. We went to the juxta lite stocking here as well. ooStill vulnerable in the left buttock although only 2  small open areas remain here Objective Constitutional Sitting or standing Blood Pressure is within target range for patient.. Pulse regular and within target range for patient.Marland Kitchen Respirations regular, non-labored and within target range.. Temperature is normal and within the target range for the patient.Marland Kitchen Appears in no distress. Vitals Time Taken: 7:48 AM, Height: 70 in, Weight: 216 lbs, BMI: 31, Temperature: 98.1 F, Pulse: 112 bpm, Respiratory Rate: 18 breaths/min, Blood Pressure: 126/77 mmHg. General Notes: Wound exam; left buttock wound has excoriated skin I think from chronic friction and moisture but only 2 small open areas remain here. ooLeft lateral ankle is closed again this week Notooleft dorsal foot is closed. ooOn the right his first second toe webspace wound is closed. He has a vulnerable area on the right dorsal ankle which may have to do with spasms of his leg in any form of compression such as his compression stocking Integumentary (Hair, Skin) Wound #38 status is Healed - Epithelialized. Original cause of wound was Gradually Appeared. The wound is located on the Right T - Web between 1st and oe 2nd. The wound measures 0cm length x 0cm width x 0cm depth; 0cm^2 area and 0cm^3 volume. There is no tunneling or undermining noted. There is a none present amount of drainage noted. The wound margin is distinct with the outline attached to the wound base. There is no granulation within the wound bed. There is no necrotic tissue within the wound bed. Wound #41R status is Open. Original cause of wound was Gradually Appeared. The wound is located on the Left Ischium. The wound measures 0.3cm length x 0.1cm width x 0.1cm depth; 0.024cm^2 area and 0.002cm^3 volume. There is Fat Layer (Subcutaneous Tissue) exposed. There is no tunneling or undermining noted. There is a small amount of serosanguineous drainage noted. The wound margin is distinct with the outline attached to the wound base.  There is large (67- 100%) granulation within the wound bed. There is no necrotic tissue within the wound bed. Assessment Active Problems ICD-10 Chronic venous hypertension (idiopathic) with ulcer and inflammation of left lower extremity 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 to breakdown of skin Non-pressure chronic ulcer of right ankle limited to breakdown of skin Non-pressure chronic ulcer of other part of right foot limited to breakdown of skin Non-pressure chronic ulcer of other part of left foot limited to breakdown of skin Pressure ulcer of left buttock, stage 3 Paraplegia,  complete Plan Follow-up Appointments: Return Appointment in 2 weeks. Dressing Change Frequency: Wound #41R Left Ischium: Change Dressing every other day. Skin Barriers/Peri-Wound Care: Moisturizing lotion - both legs daily Wound Cleansing: May shower and wash wound with soap and water. - on days that dressing is changed Primary Wound Dressing: Wound #41R Left Ischium: Calcium Alginate with Silver Secondary Dressing: Wound #41R Left Ischium: Foam Border - or ABD pad and tape Edema Control: Elevate legs to the level of the heart or above for 30 minutes daily and/or when sitting, a frequency of: - throughout the day Support Garment 30-40 mm/Hg pressure to: - Juxtalite to both legs daily Off-Loading: Low air-loss mattress (Group 2) Roho cushion for wheelchair Turn and reposition every 2 hours - out of wheelchair throughout the day, try to lay on sides, sleep in the bed not the recliner 1. I did not change any of his primary dressings the only real wound that is left is 2 small areas on the left buttock 2 his lower extremity areas are closed we put both his legs and his stockings. I advised him to pad the area on the right ankle, lotion his skin in his bilateral feet and keep his legs elevated when he can rather than leaving him dependent when he is sitting in the wheelchair 3. Is close to being  closed as we have had this patient. We will see him again in 2 weeks in follow-up Electronic Signature(s) Signed: 08/05/2020 5:12:45 PM By: Linton Ham MD Entered By: Linton Ham on 08/04/2020 08:51:22 -------------------------------------------------------------------------------- SuperBill Details Patient Name: Date of Service: Sefcik, [name] 08/04/2020 Medical Record Number: [id number] Patient Account Number: [id number] Date of Birth/Sex: Treating RN: 08-14-1986 (34 y.o. [name] Primary Care Provider: [place], [place] Other Clinician: Referring Provider: Treating Provider/Extender: [name]: 239 Diagnosis Coding ICD-10 Codes Code Description I87.332 Chronic venous hypertension (idiopathic) with ulcer and inflammation of left lower extremity L97.321 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 to breakdown of skin L97.311 Non-pressure chronic ulcer of right ankle limited to breakdown of skin L97.511 Non-pressure chronic ulcer of other part of right foot limited to breakdown of skin L97.521 Non-pressure chronic ulcer of other part of left foot limited to breakdown of 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, 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, complete Facility Procedures CPT4 Code: [id number] Description: 99213 - WOUND CARE VISIT-LEV 3 EST PT Modifier: Quantity: 1 Physician Procedures : CPT4 Code Description Modifier [id number] - WC PHYS LEVEL 3 - EST PT ICD-10 Diagnosis Description 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, stage 3 I87.332 Chronic venous hypertension (idiopathic) with ulcer and inflammation of left lower extremity Quantity: 1 Electronic Signature(s) Signed: 08/05/2020 5:12:45 PM By: Linton Ham MD Entered By: Linton Ham on 08/04/2020 08:51:55

## 2020-08-11 NOTE — Progress Notes (Signed)
Golubski, [name] ([id number]) Visit Report for 08/04/2020 Arrival Information Details Patient Name: Date of Service: [name], [name] 08/04/2020 7:30 A M Medical Record Number: [id number] Patient Account Number: [id number] Date of Birth/Sex: Treating RN: September 24, 1986 (34 y.o. [name]) Carlene Coria Primary Care Junior Kenedy: [name], [name] Other Clinician: Referring Riane Rung: Treating Kelan Pritt/Extender: [name]: 41 Visit Information History Since Last Visit All ordered tests and consults were completed: No Patient Arrived: Wheel Chair Added or deleted any medications: No Arrival Time: 07:47 Any new allergies or adverse reactions: No Accompanied By: self Had a fall or experienced change in No Transfer Assistance: None activities of daily living that may affect Patient Identification Verified: Yes risk of falls: Secondary Verification Process Completed: Yes Signs or symptoms of abuse/neglect since last visito No Patient Requires Transmission-Based Precautions: No Hospitalized since last visit: No Patient Has Alerts: Yes Implantable device outside of the clinic excluding No Patient Alerts: R ABI = 1.0 cellular tissue based products placed in the center L ABI = 1.1 since last visit: Has Dressing in Place as Prescribed: Yes Has Compression in Place as Prescribed: Yes Pain Present Now: No Electronic Signature(s) Signed: 08/11/2020 1:16:40 PM By: Carlene Coria RN Entered By: Carlene Coria on 08/04/2020 07:47:58 -------------------------------------------------------------------------------- Clinic Level of Care Assessment Details Patient Name: Date of Service: [name], [name] 08/04/2020 7:30 A M Medical Record Number: [id number] Patient Account Number: [id number] Date of Birth/Sex: Treating RN: [birth year] (34 y.o. [name] Primary Care Fatmata Legere: [place], [place] Other Clinician: Referring Sela Falk: Treating Random Dobrowski/Extender: [name]: 28 Clinic Level of Care Assessment Items TOOL 4 Quantity Score X- 1 0 Use when only an EandM is performed on FOLLOW-UP visit ASSESSMENTS - Nursing Assessment / Reassessment X- 1 10 Reassessment of Co-morbidities (includes updates in patient status) X- 1 5 Reassessment of Adherence to Treatment Plan ASSESSMENTS - Wound and Skin A ssessment / Reassessment []  - 0 Simple Wound Assessment / Reassessment - one wound X- 2 5 Complex Wound Assessment / Reassessment - multiple wounds []  - 0 Dermatologic / Skin Assessment (not related to wound area) ASSESSMENTS - Focused Assessment []  - 0 Circumferential Edema Measurements - multi extremities []  - 0 Nutritional Assessment / Counseling / Intervention X- 1 5 Lower Extremity Assessment (monofilament, tuning fork, pulses) []  - 0 Peripheral Arterial Disease Assessment (using hand held doppler) ASSESSMENTS - Ostomy and/or Continence Assessment and Care []  - 0 Incontinence Assessment and Management []  - 0 Ostomy Care Assessment and Management (repouching, etc.) PROCESS - Coordination of Care X - Simple Patient / Family Education for ongoing care 1 15 []  - 0 Complex (extensive) Patient / Family Education for ongoing care X- 1 10 Staff obtains Programmer, systems, Records, T Results / Process Orders est []  - 0 Staff telephones HHA, Nursing Homes / Clarify orders / etc []  - 0 Routine Transfer to another Facility (non-emergent condition) []  - 0 Routine Hospital Admission (non-emergent condition) []  - 0 New Admissions / Biomedical engineer / Ordering NPWT Apligraf, etc. , []  - 0 Emergency Hospital Admission (emergent condition) X- 1 10 Simple Discharge Coordination []  - 0 Complex (extensive) Discharge Coordination PROCESS - Special Needs []  - 0 Pediatric / Minor Patient Management []  - 0 Isolation Patient Management []  - 0 Hearing / Language / Visual special needs []  - 0 Assessment of Community assistance  (transportation, D/C planning, etc.) []  - 0 Additional assistance / Altered mentation []  - 0 Support Surface(s) Assessment (bed, cushion, seat,  etc.) INTERVENTIONS - Wound Cleansing / Measurement []  - 0 Simple Wound Cleansing - one wound X- 2 5 Complex Wound Cleansing - multiple wounds X- 1 5 Wound Imaging (photographs - any number of wounds) []  - 0 Wound Tracing (instead of photographs) []  - 0 Simple Wound Measurement - one wound X- 2 5 Complex Wound Measurement - multiple wounds INTERVENTIONS - Wound Dressings X - Small Wound Dressing one or multiple wounds 1 10 []  - 0 Medium Wound Dressing one or multiple wounds []  - 0 Large Wound Dressing one or multiple wounds []  - 0 Application of Medications - topical []  - 0 Application of Medications - injection INTERVENTIONS - Miscellaneous []  - 0 External ear exam []  - 0 Specimen Collection (cultures, biopsies, blood, body fluids, etc.) []  - 0 Specimen(s) / Culture(s) sent or taken to Lab for analysis []  - 0 Patient Transfer (multiple staff / Civil Service fast streamer / Similar devices) []  - 0 Simple Staple / Suture removal (25 or less) []  - 0 Complex Staple / Suture removal (26 or more) []  - 0 Hypo / Hyperglycemic Management (close monitor of Blood Glucose) []  - 0 Ankle / Brachial Index (ABI) - do not check if billed separately X- 1 5 Vital Signs Has the patient been seen at the hospital within the last three years: Yes Total Score: 105 Level Of Care: New/Established - Level 3 Electronic Signature(s) Signed: 08/06/2020 5:57:30 PM By: Levan Hurst RN, BSN Entered By: Levan Hurst on 08/04/2020 08:37:13 -------------------------------------------------------------------------------- Encounter Discharge Information Details Patient Name: Date of Service: [name], [name] 08/04/2020 7:30 A M Medical Record Number: [id number] Patient Account Number: [id number] Date of Birth/Sex: Treating RN: [birth year] (34 y.o. [name] Primary Care Kalyse Meharg: [place], [place] Other Clinician: Referring Hana Trippett: Treating Param Capri/Extender: [name]: 67 Encounter Discharge Information Items Discharge Condition: Stable Ambulatory Status: Wheelchair Discharge Destination: Home Transportation: Private Auto Accompanied By: self Schedule Follow-up Appointment: Yes Clinical Summary of Care: Electronic Signature(s) Signed: 08/04/2020 5:15:56 PM By: Deon Pilling Entered By: Deon Pilling on 08/04/2020 08:43:11 -------------------------------------------------------------------------------- Lower Extremity Assessment Details Patient Name: Date of Service: Fiorillo, [name] 08/04/2020 7:30 A M Medical Record Number: [id number] Patient Account Number: [id number] Date of Birth/Sex: Treating RN: 12-26-1985 (34 y.o. [name] Primary Care Deaven Barron: [place], [place] Other Clinician: Referring Corona Popovich: Treating Jahmiyah Dullea/Extender: [name]: 239 Edema Assessment Assessed: [Left: No] [Right: No] Edema: [Left: No] [Right: Yes] Calf Left: Right: Point of Measurement: 33 cm From Medial Instep cm 33 cm Ankle Left: Right: Point of Measurement: 10 cm From Medial Instep cm 23 cm Electronic Signature(s) Signed: 08/11/2020 1:16:40 PM By: Carlene Coria RN Entered By: Carlene Coria on 08/04/2020 08:04:17 -------------------------------------------------------------------------------- Multi Wound Chart Details Patient Name: Date of Service: Kundert, [name] 08/04/2020 7:30 A M Medical Record Number: [id number] Patient Account Number: [id number] Date of Birth/Sex: Treating RN: Jan 01, 1986 (34 y.o. M) Primary Care Fintan Grater: [name], [name] Other Clinician: Referring Jamarkus Lisbon: Treating Chelbie Jarnagin/Extender: [name]: 239 Vital Signs Height(in): 70 Pulse(bpm): 112 Weight(lbs): 216 Blood Pressure(mmHg): 126/77 Body Mass  Index(BMI): 31 Temperature(F): 98.1 Respiratory Rate(breaths/min): 18 Photos: [38:No 65 Right T - Web between 1st and 2nd Left Ischium oe] [41R:No Photos] [N/A:N/A N/A] Wound Location: [38:Gradually Appeared] [41R:Gradually Appeared] [N/A:N/A] Wounding Event: [38:Inflammatory] [41R:Pressure Ulcer] [N/A:N/A] Primary Etiology: [38:Sleep Apnea, Hypertension, Paraplegia Sleep Apnea, Hypertension, Paraplegia N/A] Comorbid History: [38:11/30/2019] [41R:03/16/2020] [N/A:N/A] Date Acquired: [38:35] [41R:20] [N/A:N/A] Weeks of Treatment: [38:Healed -  Epithelialized] [41R:Open] [N/A:N/A] Wound Status: [38:No] [41R:Yes] [N/A:N/A] Wound Recurrence: [38:0x0x0] [41R:0.3x0.1x0.1] [N/A:N/A] Measurements L x W x D (cm) [38:0] [41R:0.024] [N/A:N/A] A (cm) : rea [38:0] [41R:0.002] [N/A:N/A] Volume (cm) : [38:100.00%] [41R:98.70%] [N/A:N/A] % Reduction in A rea: [38:100.00%] [41R:98.90%] [N/A:N/A] % Reduction in Volume: [38:Full Thickness Without Exposed] [41R:Category/Stage II] [N/A:N/A] Classification: [38:Support Structures None Present] [41R:Small] [N/A:N/A] Exudate Amount: [38:N/A] [41R:Serosanguineous] [N/A:N/A] Exudate Type: [38:N/A] [41R:red, brown] [N/A:N/A] Exudate Color: [38:Distinct, outline attached] [41R:Distinct, outline attached] [N/A:N/A] Wound Margin: [38:None Present (0%)] [41R:Large (67-100%)] [N/A:N/A] Granulation Amount: [38:None Present (0%)] [41R:None Present (0%)] [N/A:N/A] Necrotic Amount: [38:Fascia: No] [41R:Fat Layer (Subcutaneous Tissue): Yes N/A] Exposed Structures: [38:Fat Layer (Subcutaneous Tissue): No Fascia: No Tendon: No Muscle: No Joint: No Bone: No Large (67-100%)] [41R:Tendon: No Muscle: No Joint: No Bone: No Large (67-100%)] [N/A:N/A] Treatment Notes Wound #41R (Left Ischium) 1. Cleanse With Wound Cleanser 3. Primary Dressing Applied Calcium Alginate Ag 4. Secondary Dressing Foam Border Dressing Notes patient instructed to elevate legs and wear  compression garments juxalites to bilateral legs. Electronic Signature(s) Signed: 08/05/2020 5:12:45 PM By: Linton Ham MD Entered By: Linton Ham on 08/04/2020 08:47:38 -------------------------------------------------------------------------------- Multi-Disciplinary Care Plan Details Patient Name: Date of Service: Antunes, [name] 08/04/2020 7:30 A M Medical Record Number: [id number] Patient Account Number: [id number] Date of Birth/Sex: Treating RN: 23-Mar-1986 (34 y.o. [name] Primary Care Jacob Cicero: [name], [name] Other Clinician: Referring Zoraida Havrilla: Treating Taelynn Mcelhannon/Extender: [name]: 239 Active Inactive Wound/Skin Impairment Nursing Diagnoses: Impaired tissue integrity Knowledge deficit related to ulceration/compromised skin integrity Goals: Patient/caregiver will verbalize understanding of skin care regimen Date Initiated: 01/05/2016 Target Resolution Date: 09/05/2020 Goal Status: Active Ulcer/skin breakdown will have a volume reduction of 30% by week 4 Date Initiated: 01/05/2016 Date Inactivated: 12/22/2017 Target Resolution Date: 01/19/2018 Unmet Reason: complex wounds, Goal Status: Unmet infection Interventions: Assess patient/caregiver ability to obtain necessary supplies Assess ulceration(s) every visit Provide education on ulcer and skin care Notes: Electronic Signature(s) Signed: 08/06/2020 5:57:30 PM By: Levan Hurst RN, BSN Entered By: Levan Hurst on 08/04/2020 08:08:27 -------------------------------------------------------------------------------- Pain Assessment Details Patient Name: Date of Service: Busick, [name] 08/04/2020 7:30 A M Medical Record Number: [id number] Patient Account Number: [id number] Date of Birth/Sex: Treating RN: 28-Oct-1986 (34 y.o. [name] Primary Care Meena Barrantes: [place], [place] Other Clinician: Referring Brissa Asante: Treating Mayla Biddy/Extender: [name]: 239 Active Problems Location of Pain Severity and Description of Pain Patient Has Paino No Site Locations Pain Management and Medication Current Pain Management: Electronic Signature(s) Signed: 08/11/2020 1:16:40 PM By: Carlene Coria RN Entered By: Carlene Coria on 08/04/2020 07:48:24 -------------------------------------------------------------------------------- Patient/Caregiver Education Details Patient Name: Date of Service: Hursey, [name] 9/13/2021andnbsp7:30 A M Medical Record Number: [id number] Patient Account Number: [id number] Date of Birth/Gender: Treating RN: [birth year] (34 y.o. [name] Primary Care Physician: Janine Limbo Other Clinician: Referring Physician: Treating Physician/Extender: [name]: 71 Education Assessment Education Provided To: Patient Education Topics Provided Wound/Skin Impairment: Methods: Explain/Verbal Responses: State content correctly Electronic Signature(s) Signed: 08/06/2020 5:57:30 PM By: Levan Hurst RN, BSN Entered By: Levan Hurst on 08/04/2020 08:21:52 -------------------------------------------------------------------------------- Wound Assessment Details Patient Name: Date of Service: Shell, [name] 08/04/2020 7:30 A M Medical Record Number: [id number] Patient Account Number: [id number] Date of Birth/Sex: Treating RN: Feb 10, 1986 (34 y.o. [name] Primary Care Malaia Buchta: [name], [name] Other Clinician: Referring Tifani Dack: Treating Thompson Mckim/Extender: [name]: 239 Wound Status Wound Number: 1 Primary  Etiology: Inflammatory Wound Location: Right T - Web between 1st and 2nd oe Wound Status: Healed - Epithelialized Wounding Event: Gradually Appeared Comorbid History: Sleep Apnea, Hypertension, Paraplegia Date Acquired: 11/30/2019 Weeks Of Treatment: 35 Clustered Wound:  No Photos Photo Uploaded By: Mikeal Hawthorne on 08/06/2020 14:37:47 Wound Measurements Length: (cm) Width: (cm) Depth: (cm) Area: (cm) Volume: (cm) 0 % Reduction in Area: 100% 0 % Reduction in Volume: 100% 0 Epithelialization: Large (67-100%) 0 Tunneling: No 0 Undermining: No Wound Description Classification: Full Thickness Without Exposed Support Structures Wound Margin: Distinct, outline attached Exudate Amount: None Present Foul Odor After Cleansing: No Slough/Fibrino No Wound Bed Granulation Amount: None Present (0%) Exposed Structure Necrotic Amount: None Present (0%) Fascia Exposed: No Fat Layer (Subcutaneous Tissue) Exposed: No Tendon Exposed: No Muscle Exposed: No Joint Exposed: No Bone Exposed: No Electronic Signature(s) Signed: 08/06/2020 5:57:30 PM By: Levan Hurst RN, BSN Signed: 08/11/2020 1:16:40 PM By: Carlene Coria RN Entered By: Levan Hurst on 08/04/2020 08:28:54 -------------------------------------------------------------------------------- Wound Assessment Details Patient Name: Date of Service: Scardina, [name] 08/04/2020 7:30 A M Medical Record Number: [id number] Patient Account Number: [id number] Date of Birth/Sex: Treating RN: Sep 15, 1986 (34 y.o. [name] Primary Care Bama Hanselman: [name], [name] Other Clinician: Referring Kalika Smay: Treating Sylvania Moss/Extender: [name]: 239 Wound Status Wound Number: [id number] Primary Etiology: Pressure Ulcer Wound Location: Left Ischium Wound Status: Open Wounding Event: Gradually Appeared Comorbid History: Sleep Apnea, Hypertension, Paraplegia Date Acquired: 03/16/2020 Weeks Of Treatment: 20 Clustered Wound: No Photos Photo Uploaded By: Mikeal Hawthorne on 08/06/2020 14:37:47 Wound Measurements Length: (cm) 0.3 Width: (cm) 0.1 Depth: (cm) 0.1 Area: (cm) 0.024 Volume: (cm) 0.002 % Reduction in Area: 98.7% % Reduction in Volume: 98.9% Epithelialization: Large  (67-100%) Tunneling: No Undermining: No Wound Description Classification: Category/Stage II Wound Margin: Distinct, outline attached Exudate Amount: Small Exudate Type: Serosanguineous Exudate Color: red, brown Foul Odor After Cleansing: No Slough/Fibrino No Wound Bed Granulation Amount: Large (67-100%) Exposed Structure Necrotic Amount: None Present (0%) Fascia Exposed: No Fat Layer (Subcutaneous Tissue) Exposed: Yes Tendon Exposed: No Muscle Exposed: No Joint Exposed: No Bone Exposed: No Treatment Notes Wound #41R (Left Ischium) 1. Cleanse With Wound Cleanser 3. Primary Dressing Applied Calcium Alginate Ag 4. Secondary Dressing Foam Border Dressing Notes patient instructed to elevate legs and wear compression garments juxalites to bilateral legs. Electronic Signature(s) Signed: 08/11/2020 1:16:40 PM By: Carlene Coria RN Entered By: Carlene Coria on 08/04/2020 08:07:02 -------------------------------------------------------------------------------- [place] Details Patient Name: Date of Service: [place], [place] 08/04/2020 7:30 A M Medical Record Number: [id number] Patient Account Number: [id number] Date of Birth/Sex: Treating RN: [birth year] (34 y.o. [name]) Carlene Coria Primary Care Shelia Kingsberry: [place], [place] Other Clinician: Referring Scottlyn Mchaney: Treating Lavante Toso/Extender: [name]: 239 Vital Signs Time Taken: 07:48 Temperature (F): 98.1 Height (in): 70 Pulse (bpm): 112 Weight (lbs): 216 Respiratory Rate (breaths/min): 18 Body Mass Index (BMI): 31 Blood Pressure (mmHg): 126/77 Reference Range: 80 - 120 mg / dl Electronic Signature(s) Signed: 08/11/2020 1:16:40 PM By: Carlene Coria RN Entered By: Carlene Coria on 08/04/2020 07:48:18

## 2020-08-18 ENCOUNTER — Encounter (HOSPITAL_BASED_OUTPATIENT_CLINIC_OR_DEPARTMENT_OTHER): Payer: BC Managed Care – PPO | Admitting: Internal Medicine

## 2020-08-18 DIAGNOSIS — L97521 Non-pressure chronic ulcer of other part of left foot limited to breakdown of skin: Secondary | ICD-10-CM | POA: Diagnosis not present

## 2020-08-18 DIAGNOSIS — L97312 Non-pressure chronic ulcer of right 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: Secondary | ICD-10-CM | POA: Diagnosis not present

## 2020-08-18 DIAGNOSIS — I87332 Chronic venous hypertension (idiopathic) with ulcer and inflammation of left lower extremity: Secondary | ICD-10-CM | POA: Diagnosis not present

## 2020-08-18 DIAGNOSIS — L97321 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 to breakdown of skin: Secondary | ICD-10-CM | POA: Diagnosis not present

## 2020-08-18 DIAGNOSIS — L98499 Non-pressure chronic ulcer of skin of other sites with unspecified severity: Secondary | ICD-10-CM | POA: Diagnosis not present

## 2020-08-18 DIAGNOSIS — G4730 Sleep apnea, unspecified: Secondary | ICD-10-CM | POA: Diagnosis not present

## 2020-08-18 DIAGNOSIS — L97311 Non-pressure chronic ulcer of right ankle limited to breakdown of skin: Secondary | ICD-10-CM | POA: Diagnosis not present

## 2020-08-18 DIAGNOSIS — L97511 Non-pressure chronic ulcer of other part of right foot limited to breakdown of skin: Secondary | ICD-10-CM | POA: Diagnosis not present

## 2020-08-18 DIAGNOSIS — Z8614 Personal history of Methicillin resistant Staphylococcus aureus infection: Secondary | ICD-10-CM | POA: Diagnosis not present

## 2020-08-18 DIAGNOSIS — I10 Essential (primary) hypertension: Secondary | ICD-10-CM | POA: Diagnosis not present

## 2020-08-18 DIAGNOSIS — Z9081 Acquired absence of spleen: Secondary | ICD-10-CM | POA: Diagnosis not present

## 2020-08-18 DIAGNOSIS — Z89422 Acquired absence of other left toe(s): Secondary | ICD-10-CM | POA: Diagnosis not present

## 2020-08-18 DIAGNOSIS — L89323 Pressure ulcer of left buttock, stage 3: Secondary | ICD-10-CM | POA: Diagnosis not present

## 2020-08-18 DIAGNOSIS — I872 Venous insufficiency (chronic) (peripheral): Secondary | ICD-10-CM | POA: Diagnosis not present

## 2020-08-18 DIAGNOSIS — Z87440 Personal history of urinary (tract) infections: Secondary | ICD-10-CM | POA: Diagnosis not present

## 2020-08-18 DIAGNOSIS — G8221 Paraplegia, complete: Secondary | ICD-10-CM | POA: Diagnosis not present

## 2020-08-19 NOTE — Progress Notes (Signed)
[name], [name] ([id number]) Visit Report for 08/18/2020 Arrival Information Details Patient Name: Date of Service: [name], [name] 08/18/2020 8:00 [name] Medical Record Number: [id number] Patient Account Number: [id number] Date of Birth/Sex: Treating RN: 04/02/1986 (34 y.o. [name]) Carlene Coria Primary Care Atreus Hasz: [name], [name] Other Clinician: Referring Granger Chui: Treating Jacinda Kanady/Extender: [name]: 42 Visit Information History Since Last Visit All ordered tests and consults were completed: No Patient Arrived: Wheel Chair Added or deleted any medications: No Arrival Time: 08:11 Any new allergies or adverse reactions: No Accompanied By: self Had [name] fall or experienced change in No Transfer Assistance: None activities of daily living that may affect Patient Identification Verified: Yes risk of falls: Secondary Verification Process Completed: Yes Signs or symptoms of abuse/neglect since last visito No Patient Requires Transmission-Based Precautions: No Hospitalized since last visit: No Patient Has Alerts: Yes Implantable device outside of the clinic excluding No Patient Alerts: R ABI = 1.0 cellular tissue based products placed in the center L ABI = 1.1 since last visit: Has Dressing in Place as Prescribed: Yes Pain Present Now: No Electronic Signature(s) Signed: 08/19/2020 5:34:55 PM By: Carlene Coria RN Entered By: Carlene Coria on 08/18/2020 08:11:58 -------------------------------------------------------------------------------- Clinic Level of Care Assessment Details Patient Name: Date of Service: [name], [name] 08/18/2020 8:00 [name] Medical Record Number: [id number] Patient Account Number: [id number] Date of Birth/Sex: Treating RN: 09/12/1986 (34 y.o. [name] Primary Care Nannette Zill: [name], [name] Other Clinician: Referring Emalie Mcwethy: Treating Tyeson Tanimoto/Extender: [name]: 63 Clinic Level of  Care Assessment Items TOOL 4 Quantity Score X- 1 0 Use when only an EandM is performed on FOLLOW-UP visit ASSESSMENTS - Nursing Assessment / Reassessment X- 1 10 Reassessment of Co-morbidities (includes updates in patient status) X- 1 5 Reassessment of Adherence to Treatment Plan ASSESSMENTS - Wound and Skin [name] ssessment / Reassessment []  - 0 Simple Wound Assessment / Reassessment - one wound X- 2 5 Complex Wound Assessment / Reassessment - multiple wounds []  - 0 Dermatologic / Skin Assessment (not related to wound area) ASSESSMENTS - Focused Assessment []  - 0 Circumferential Edema Measurements - multi extremities []  - 0 Nutritional Assessment / Counseling / Intervention []  - 0 Lower Extremity Assessment (monofilament, tuning fork, pulses) []  - 0 Peripheral Arterial Disease Assessment (using hand held doppler) ASSESSMENTS - Ostomy and/or Continence Assessment and Care []  - 0 Incontinence Assessment and Management []  - 0 Ostomy Care Assessment and Management (repouching, etc.) PROCESS - Coordination of Care X - Simple Patient / Family Education for ongoing care 1 15 []  - 0 Complex (extensive) Patient / Family Education for ongoing care X- 1 10 Staff obtains Programmer, systems, Records, T Results / Process Orders est []  - 0 Staff telephones HHA, Nursing Homes / Clarify orders / etc []  - 0 Routine Transfer to another Facility (non-emergent condition) []  - 0 Routine Hospital Admission (non-emergent condition) []  - 0 New Admissions / Biomedical engineer / Ordering NPWT Apligraf, etc. , []  - 0 Emergency Hospital Admission (emergent condition) X- 1 10 Simple Discharge Coordination []  - 0 Complex (extensive) Discharge Coordination PROCESS - Special Needs []  - 0 Pediatric / Minor Patient Management []  - 0 Isolation Patient Management []  - 0 Hearing / Language / Visual special needs []  - 0 Assessment of Community assistance (transportation, D/C planning, etc.) []  -  0 Additional assistance / Altered mentation []  - 0 Support Surface(s) Assessment (bed, cushion, seat, etc.) INTERVENTIONS - Wound Cleansing / Measurement []  -  0 Simple Wound Cleansing - one wound X- 2 5 Complex Wound Cleansing - multiple wounds X- 1 5 Wound Imaging (photographs - any number of wounds) []  - 0 Wound Tracing (instead of photographs) []  - 0 Simple Wound Measurement - one wound X- 2 5 Complex Wound Measurement - multiple wounds INTERVENTIONS - Wound Dressings X - Small Wound Dressing one or multiple wounds 2 10 []  - 0 Medium Wound Dressing one or multiple wounds []  - 0 Large Wound Dressing one or multiple wounds X- 1 5 Application of Medications - topical []  - 0 Application of Medications - injection INTERVENTIONS - Miscellaneous []  - 0 External ear exam []  - 0 Specimen Collection (cultures, biopsies, blood, body fluids, etc.) []  - 0 Specimen(s) / Culture(s) sent or taken to Lab for analysis []  - 0 Patient Transfer (multiple staff / Civil Service fast streamer / Similar devices) []  - 0 Simple Staple / Suture removal (25 or less) []  - 0 Complex Staple / Suture removal (26 or more) []  - 0 Hypo / Hyperglycemic Management (close monitor of Blood Glucose) []  - 0 Ankle / Brachial Index (ABI) - do not check if billed separately X- 1 5 Vital Signs Has the patient been seen at the hospital within the last three years: Yes Total Score: 115 Level Of Care: New/Established - Level 3 Electronic Signature(s) Signed: 08/18/2020 5:31:38 PM By: Levan Hurst RN, BSN Entered By: Levan Hurst on 08/18/2020 08:33:28 -------------------------------------------------------------------------------- Encounter Discharge Information Details Patient Name: Date of Service: [name], [name] 08/18/2020 8:00 [name] Medical Record Number: [id number] Patient Account Number: [id number] Date of Birth/Sex: Treating RN: 10-21-1986 (34 y.o. [name] Primary Care Mar Walmer: [place], [place] Other  Clinician: Referring Euell Schiff: Treating Zaliah Wissner/Extender: [name]: [phone] Encounter Discharge Information Items Discharge Condition: Stable Ambulatory Status: Wheelchair Discharge Destination: Home Transportation: Private Auto Accompanied By: self Schedule Follow-up Appointment: Yes Clinical Summary of Care: Electronic Signature(s) Signed: 08/18/2020 5:26:25 PM By: Deon Pilling Entered By: Deon Pilling on 08/18/2020 08:43:41 -------------------------------------------------------------------------------- Multi Wound Chart Details Patient Name: Date of Service: [name], [name] 08/18/2020 8:00 [name] Medical Record Number: [id number] Patient Account Number: [id number] Date of Birth/Sex: Treating RN: 1986-11-21 (34 y.o. [name] Primary Care Nahuel Wilbert: [name], [name] Other Clinician: Referring Milo Schreier: Treating Lugene Beougher/Extender: Dutch Gray, [name]: 241 Vital Signs Height(in): 70 Pulse(bpm): 103 Weight(lbs): 216 Blood Pressure(mmHg): 125/74 Body Mass Index(BMI): 31 Temperature(F): 98 Respiratory Rate(breaths/min): 18 Photos: [41R:No Photos Left Ischium] [46:No Photos Right, Dorsal Ankle] [[name]:[name] [name]] Wound Location: [41R:Gradually Appeared] [46:Gradually Appeared] [[name]:[name]] Wounding Event: [41R:Pressure Ulcer] [46:Abrasion] [[name]:[name]] Primary Etiology: [41R:Sleep Apnea, Hypertension, Paraplegia Sleep Apnea, Hypertension, Paraplegia [name]] Comorbid History: [41R:03/16/2020] [46:08/13/2020] [[name]:[name]] Date Acquired: [41R:22] [46:0] [[name]:[name]] Weeks of Treatment: [41R:Open] [46:Open] [[name]:[name]] Wound Status: [41R:Yes] [46:No] [[name]:[name]] Wound Recurrence: [41R:4.5x4x0.1] [46:0.4x0.2x0.1] [[name]:[name]] Measurements L x W x D (cm) [41R:14.137] [46:0.063] [[name]:[name]] [name] (cm) : rea [41R:1.414] [46:0.006] [[name]:[name]] Volume (cm) : [41R:-682.80%] [46:0.00%] [[name]:[name]] % Reduction in Area: [41R:-681.20%] [46:0.00%]  [[name]:[name]] % Reduction in Volume: [41R:Category/Stage II] [46:Full Thickness Without Exposed] [[name]:[name]] Classification: [41R:Small] [46:Support Structures Small] [[name]:[name]] Exudate Amount: [41R:Serosanguineous] [46:Serosanguineous] [[name]:[name]] Exudate Type: [41R:red, brown] [46:red, brown] [[name]:[name]] Exudate Color: [41R:Distinct, outline attached] [46:[name]] [[name]:[name]] Wound Margin: [41R:Large (67-100%)] [46:Medium (34-66%)] [[name]:[name]] Granulation Amount: [41R:Friable] [46:Red] [[name]:[name]] Granulation Quality: [41R:None Present (0%)] [46:Medium (34-66%)] [[name]:[name]] Necrotic Amount: [41R:Fat Layer (Subcutaneous Tissue): Yes Fat Layer (Subcutaneous Tissue): Yes [name]] Exposed Structures: [41R:Fascia: No Tendon: No Muscle: No Joint: No Bone: No Large (67-100%)] [46:Fascia: No  Tendon: No Muscle: No Joint: No Bone: No None] [[name]:[name]] Treatment Notes Electronic Signature(s) Signed: 08/18/2020 5:31:38 PM By: Levan Hurst RN, BSN Signed: 08/19/2020 7:37:15 AM By: Linton Ham MD Entered By: Linton Ham on 08/18/2020 08:40:38 -------------------------------------------------------------------------------- Multi-Disciplinary Care Plan Details Patient Name: Date of Service: [name], [name] 08/18/2020 8:00 [name] Medical Record Number: [id number] Patient Account Number: [id number] Date of Birth/Sex: Treating RN: [birth year] (34 y.o. [name] Primary Care Mackson Botz: [name], [name] Other Clinician: Referring Makiyah Zentz: Treating Cristian Grieves/Extender: [name]: 241 Active Inactive Wound/Skin Impairment Nursing Diagnoses: Impaired tissue integrity Knowledge deficit related to ulceration/compromised skin integrity Goals: Patient/caregiver will verbalize understanding of skin care regimen Date Initiated: 01/05/2016 Target Resolution Date: 09/05/2020 Goal Status: Active Ulcer/skin breakdown will have [name] volume reduction of 30% by week 4 Date Initiated:  01/05/2016 Date Inactivated: 12/22/2017 Target Resolution Date: 01/19/2018 Unmet Reason: complex wounds, Goal Status: Unmet infection Interventions: Assess patient/caregiver ability to obtain necessary supplies Assess ulceration(s) every visit Provide education on ulcer and skin care Notes: Electronic Signature(s) Signed: 08/18/2020 5:31:38 PM By: Levan Hurst RN, BSN Entered By: Levan Hurst on 08/18/2020 08:10:52 -------------------------------------------------------------------------------- Pain Assessment Details Patient Name: Date of Service: [name], [name] 08/18/2020 8:00 [name] Medical Record Number: [id number] Patient Account Number: [id number] Date of Birth/Sex: Treating RN: [birth year] (34 y.o. [name] Primary Care Nyjai Graff: [place], [place] Other Clinician: Referring Imane Burrough: Treating Paxtyn Boyar/Extender: [name]: [phone] Active Problems Location of Pain Severity and Description of Pain Patient Has Paino No Site Locations Pain Management and Medication Current Pain Management: Electronic Signature(s) Signed: 08/19/2020 5:34:55 PM By: Carlene Coria RN Entered By: Carlene Coria on 08/18/2020 08:12:37 -------------------------------------------------------------------------------- Patient/Caregiver Education Details Patient Name: Date of Service: Takeshita, [name] 9/27/2021andnbsp8:00 [name] Medical Record Number: [id number] Patient Account Number: [id number] Date of Birth/Gender: Treating RN: December 11, 1985 (34 y.o. [name] Primary Care Physician: Janine Limbo Other Clinician: Referring Physician: Treating Physician/Extender: [name]: [phone] Education Assessment Education Provided To: Patient Education Topics Provided Wound/Skin Impairment: Methods: Explain/Verbal Responses: State content correctly Electronic Signature(s) Signed: 08/18/2020 5:31:38 PM By: Levan Hurst RN,  BSN Entered By: Levan Hurst on 08/18/2020 08:11:06 -------------------------------------------------------------------------------- Wound Assessment Details Patient Name: Date of Service: [name], [name] 08/18/2020 8:00 [name] Medical Record Number: [id number] Patient Account Number: [id number] Date of Birth/Sex: Treating RN: Jul 26, 1986 (34 y.o. [name] Primary Care Javar Eshbach: [name], [name] Other Clinician: Referring Shamila Lerch: Treating Gerrald Basu/Extender: [name]: 241 Wound Status Wound Number: [id number] Primary Etiology: Pressure Ulcer Wound Location: Left Ischium Wound Status: Open Wounding Event: Gradually Appeared Comorbid History: Sleep Apnea, Hypertension, Paraplegia Date Acquired: 03/16/2020 Weeks Of Treatment: 22 Clustered Wound: No Photos Photo Uploaded By: Mikeal Hawthorne on 08/19/2020 12:22:41 Wound Measurements Length: (cm) 4.5 Width: (cm) 4 Depth: (cm) 0.1 Area: (cm) 14.137 Volume: (cm) 1.414 % Reduction in Area: -682.8% % Reduction in Volume: -681.2% Epithelialization: Large (67-100%) Tunneling: No Undermining: No Wound Description Classification: Category/Stage II Wound Margin: Distinct, outline attached Exudate Amount: Small Exudate Type: Serosanguineous Exudate Color: red, brown Foul Odor After Cleansing: No Slough/Fibrino No Wound Bed Granulation Amount: Large (67-100%) Exposed Structure Granulation Quality: Friable Fascia Exposed: No Necrotic Amount: None Present (0%) Fat Layer (Subcutaneous Tissue) Exposed: Yes Tendon Exposed: No Muscle Exposed: No Joint Exposed: No Bone Exposed: No Treatment Notes Wound #41R (Left Ischium) 1. Cleanse With Wound Cleanser 3. Primary Dressing Applied Foam 4. Secondary Dressing  Foam Border Dressing 5. Secured With Office manager) Signed: 08/19/2020 5:34:55 PM By: Carlene Coria RN Entered By: Carlene Coria on 08/18/2020  08:22:36 -------------------------------------------------------------------------------- Wound Assessment Details Patient Name: Date of Service: [name], [name] 08/18/2020 8:00 [name] Medical Record Number: [id number] Patient Account Number: [id number] Date of Birth/Sex: Treating RN: [birth year] (34 y.o. [name]) Carlene Coria Primary Care Kaston Faughn: [name], [name] Other Clinician: Referring Taylen Osorto: Treating Mikalia Fessel/Extender: [name]: 241 Wound Status Wound Number: [id number] Primary Etiology: Abrasion Wound Location: Right, Dorsal Ankle Wound Status: Open Wounding Event: Gradually Appeared Comorbid History: Sleep Apnea, Hypertension, Paraplegia Date Acquired: 08/13/2020 Weeks Of Treatment: 0 Clustered Wound: No Photos Photo Uploaded By: Mikeal Hawthorne on 08/19/2020 12:22:52 Wound Measurements Length: (cm) 0.4 Width: (cm) 0.2 Depth: (cm) 0.1 Area: (cm) 0.063 Volume: (cm) 0.006 % Reduction in Area: 0% % Reduction in Volume: 0% Epithelialization: None Tunneling: No Undermining: No Wound Description Classification: Full Thickness Without Exposed Support Structures Exudate Amount: Small Exudate Type: Serosanguineous Exudate Color: red, brown Foul Odor After Cleansing: No Slough/Fibrino Yes Wound Bed Granulation Amount: Medium (34-66%) Exposed Structure Granulation Quality: Red Fascia Exposed: No Necrotic Amount: Medium (34-66%) Fat Layer (Subcutaneous Tissue) Exposed: Yes Necrotic Quality: Adherent Slough Tendon Exposed: No Muscle Exposed: No Joint Exposed: No Bone Exposed: No Treatment Notes Wound #46 (Right, Dorsal Ankle) 1. Cleanse With Wound Cleanser 3. Primary Dressing Applied Calcium Alginate Ag 4. Secondary Dressing Foam Border Dressing 5. Secured With Office manager) Signed: 08/19/2020 5:34:55 PM By: Carlene Coria RN Entered By: Carlene Coria on 08/18/2020  08:23:10 -------------------------------------------------------------------------------- Vitals Details Patient Name: Date of Service: [name], [name] 08/18/2020 8:00 [name] Medical Record Number: [id number] Patient Account Number: [id number] Date of Birth/Sex: Treating RN: [birth year] (34 y.o. [name]) Carlene Coria Primary Care Dreux Mcgroarty: [place], [place] Other Clinician: Referring Chameka Mcmullen: Treating Hussien Greenblatt/Extender: [name]: 241 Vital Signs Time Taken: 08:12 Temperature (F): 98 Height (in): 70 Pulse (bpm): 103 Weight (lbs): 216 Respiratory Rate (breaths/min): 18 Body Mass Index (BMI): 31 Blood Pressure (mmHg): 125/74 Reference Range: 80 - 120 mg / dl Electronic Signature(s) Signed: 08/19/2020 5:34:55 PM By: Carlene Coria RN Entered By: Carlene Coria on 08/18/2020 08:12:27

## 2020-08-19 NOTE — Progress Notes (Signed)
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 dimensions are better. 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 Problematic areas on the plantar aspect of the first second toe web space on the left and the dorsal aspect of the first second toe web space on the right. At one point I felt these were probably related to chronic fungal infections in treated him aggressively for this although we have not made any improvement here. 08/11/17; left lateral leg. Surface continues to improve with the Iodoflex although we are not seeing much improvement in overall wound dimensions. Areas on his plantar left foot and right foot show no improvement. In fact the right foot looks somewhat worse 08/18/17; left lateral leg. We changed to [facility] Blue last week after [name] prolonged course of Iodoflex which helps get the surface better. It appears that the wound with is improved. Continue with difficult areas on the left dorsal first second and plantar first second on the right  09/01/17; patient arrives in clinic today having had [name] temperature of 103 yesterday. He was seen in the ER and [facility]. The patient was concerned he could have cellulitis again in the right leg however they diagnosed him with [name] UTI and he is now on Keflex. He has [name] history of cellulitis which is been recurrent and difficult but this is been in the left leg, in the past 5 use doxycycline. He does in and out catheterizations at home which are risk factors for UTI 09/08/17; patient will be completing his Keflex this weekend. The erythema on the left leg is considerably better. He has [name] new  wound today on the medial part of the right leg small superficial almost looks like [name] skin tear. He has worsening of the area on the right dorsal first and second toe. His major area on the left lateral leg is better. Using Hydrofera Blue on all areas 09/15/17; gradual reduction in width on the long wound in the left lateral leg. No debridement required. He also has wounds on the plantar aspect of his left first second toe web space and on the dorsal aspect of the right first second toe web space. 09/22/17; there continues to be very gradual improvements in the dimensions of the left lateral leg wound. He hasn't round erythematous spot with might be pressure on his wheelchair. There is no evidence obviously of infection no purulence no warmth He has [name] dry scaled area on the plantar aspect of the left first second toe Improved area on the dorsal right first second toe. 09/29/17; left lateral leg wound continues to improve in dimensions mostly with an is still [name] fairly long but increasingly narrow wound. He has [name] dry scaled area on the plantar aspect of his left first second toe web space Increasingly concerning area on the dorsal right first second toe. In fact I am concerned today about possible cellulitis around this wound. The areas extending up his second toe and although there is deformities here almost appears to abut on the nailbed. 10/06/17; left lateral leg wound continues to make very gradual progress. Tissue culture I did from the right first second toe dorsal foot last time grew MRSA and enterococcus which was vancomycin sensitive. This was not sensitive to clindamycin or doxycycline. He is allergic to Zyvox and sulfa we have therefore arrange for him to have dalvance infusion tomorrow. He is had this in the past and tolerated it well 10/20/17; left lateral leg wound continues to make decent progress. This is certainly reduced in terms of with there is advancing epithelialization.The  cellulitis in the right foot looks better although he still has [name] deep wound in the dorsal aspect of the first second toe web space. Plantar left first toe web space on the left I think is making some progress 10/27/17; left lateral leg wound continues to make decent progress. Advancing epithelialization.using Hydrofera Blue The right first second toe web space wound is better-looking using silver alginate Improvement in the left plantar first second toe web space. Again using silver alginate 11/03/17 left lateral leg wound continues to make decent progress albeit slowly. Using [facility] The right per second toe web space continues to be [name] very problematic looking punched out wound. I obtained [name] piece of tissue for deep culture I did extensively treated this for fungus. It is difficult to imagine that this is [name] pressure area as the patient states other than going outside he doesn't really wear shoes at home The  left plantar first second toe web space looked fairly senescent. Necrotic edges. This required debridement change to [facility] Blue to all wound areas 11/10/17; left lateral leg wound continues to contract. Using Hydrofera Blue On the right dorsal first second toe web space dorsally. Culture I did of this area last week grew MRSA there is not an easy oral option in this patient was multiple antibiotic allergies or intolerances. This was only [name] rare culture isolate I'[name] therefore going to use Bactroban under silver alginate On the left plantar first second toe web space. Debridement is required here. This is also unchanged 11/17/17; left lateral leg wound continues to contract using Hydrofera Blue this is no longer the major issue. The major concern here is the right first second toe web space. He now has an open area going from dorsally to the plantar aspect. There is now wound on the inner lateral part of the first toe. Not [name] very viable surface on this. There is erythema spreading  medially into the forefoot. No major change in the left first second toe plantar wound 11/24/17; left lateral leg wound continues to contract using Hydrofera Blue. Nice improvement today The right first second toe web space all of this looks [name] lot less angry than last week. I have given him clindamycin and topical Bactroban for MRSA and terbinafine for the possibility of underlining tinea pedis that I could not control with ketoconazole. Looks somewhat better The area on the plantar left first second toe web space is weeping with dried debris around the wound 12/01/17; left lateral leg wound continues to contract he Hydrofera Blue. It is becoming thinner in terms of with nevertheless it is making good improvement. The right first second toe web space looks less angry but still [name] large necrotic-looking wounds starting on the plantar aspect of the right foot extending between the toes and now extensively on the base of the right second toe. I gave him clindamycin and topical Bactroban for MRSA anterior benefiting for the possibility of underlying tinea pedis. Not looking better today The area on the left first/second toe looks better. Debrided of necrotic debris 12/05/17* the patient was worked in urgently today because over the weekend he found blood on his incontinence bad when he woke up. He was found to have an ulcer by his wife who does most of his wound care. He came in today for [place] to look at this. He has not had [name] history of wounds in his buttocks in spite of his paraplegia. 12/08/17; seen in follow-up today at his usual appointment. He was seen earlier this week and found to have [name] new wound on his buttock. We also follow him for wounds on the left lateral leg, left first second toe web space and right first second toe web space 12/15/17; we have been using Hydrofera Blue to the left lateral leg which has improved. The right first second toe web space has also improved. Left first second toe web  space plantar aspect looks stable. The left buttock has worsened using Santyl. Apparently the buttock has drainage 12/22/17; we have been using Hydrofera Blue to the left lateral leg which continues to improve now 2 small wounds separated by normal skin. He tells [place] he had [name] fever up to 100 yesterday he is prone to UTIs but has not noted anything different. He does in and out catheterizations. The area between the first and second toes today does not look good necrotic surface covered with what looks  to be purulent drainage and erythema extending into the third toe. I had gotten this to something that I thought look better last time however it is not look good today. He also has [name] necrotic surface over the buttock wound which is expanded. I thought there might be infection under here so I removed [name] lot of the surface with [name] #5 curet though nothing look like it really needed culturing. He is been using Santyl to this area 12/27/17; his original wound on the left lateral leg continues to improve using Hydrofera Blue. I gave him samples of Baxdella although he was unable to take them out of fear for an allergic reaction ["lump in his throat"].the culture I did of the purulent drainage from his second toe last week showed both enterococcus and [name] set Enterobacter I was also concerned about the erythema on the bottom of his foot although paradoxically although this looks somewhat better today. 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 We've been using silver alginate here. They came up today that he sleeps in [name] recliner, I'[name] not sure why but I asked him to stop this 01/03/18; his original wound we've been using Hydrofera Blue is now separated into 2 areas. Ulcer on his left buttock is better he is off the recliner and sleeping in bed Finally both wound areas between his first and second toes also looks some  better 01/10/18; his original wound on the left lateral leg is now separated into 2 wounds we've been using Hydrofera Blue Ulcer on his left buttock has some drainage. There is [name] small probing site going into muscle layer superiorly.using silver alginate -He arrives today with [name] deep tissue injury on the left heel The wound on the dorsal aspect of his first second toe on the left looks [name] lot betterusing silver alginate ketoconazole The area on the first second toe web space on the right also looks [name] lot bette 01/17/18; his original wound on the left lateral leg continues to progress using Hydrofera Blue Ulcer on his left buttock also is smaller surface healthier except for [name] small probing site going into the muscle layer superiorly. 2.4 cm of tunneling in this area DTI on his left heel we have only been offloading. Looks better than last week no threatened open no evidence of infection the wound on the dorsal aspect of the first second toe on the left continues to look like it's regressing we have only been using silver alginate and terbinafine orally The area in the first second toe web space on the right also looks to be [name] lot better using silver alginate and terbinafine I think this was prompted by tinea pedis 01/31/18; the patient was hospitalized in [place] last week apparently for [name] complicated UTI. He was discharged on cefepime he does in and out catheterizations. In the hospital he was discovered [name] I don't mild elevation of AST and ALT and the terbinafine was stopped.predictably the pressure ulcer on s his buttock looks betterusing silver alginate. The area on the left lateral leg also is better using Hydrofera Blue. The area between the first and second toes on the left better. First and second toes on the right still substantial but better. Finally the DTI on the left heel has held together and looks like it's resolving 02/07/18-he is here in follow-up evaluation for multiple ulcerations.  He has new injury to the lateral aspect of  the last issue [name] pressure ulcer, he states this is from adhesive removal trauma. He states he has tried multiple adhesive products with no success. All other ulcers appear stable. The left heel DTI is resolving. We will continue with same treatment plan and follow-up next week. 02/14/18; follow-up for multiple areas. He has [name] new area last week on the lateral aspect of his pressure ulcer more over the posterior trochanter. The original pressure ulcer looks quite stable has healthy granulation. 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 Almost fully epithelialized on the original superior area using Hydrofera Blue DTI on the left heel has peeled off this week to reveal [name] small superficial wound under denuded skin and subcutaneous tissue Both areas between the first and second toes look better including nothing open on the left 02/21/18; The patient's wounds on his left ischial tuberosity and posterior left greater trochanter actually looked better. He has [name] large area of irritation around the area which I think is contact dermatitis. I am doubtful that this is fungal His original wound on the left lateral calf continues to improve we have been using Hydrofera Blue There is no open area in the left first second toe web space although there is [name] lot of thick callus The DTI on the left heel required debridement today of necrotic surface eschar and subcutaneous tissue using silver alginate Finally the area on the right first second toe webspace continues to contract using silver alginate and ketoconazole 02/28/18 Left ischial tuberosity wounds look better using silver alginate. Original wound on the left calf only has one small open area left using Hydrofera Blue DTI on the left heel required debridement mostly removing skin from around this wound surface. Using  silver alginate The areas on the right first/second toe web space using silver alginate and ketoconazole 03/08/18 on evaluation today patient appears to be doing decently well as best I can tell in regard to his wounds. This is the first time that I have seen him as he generally is followed by Dr. Dellia Nims. With that being said none of his wounds appear to be infected he does have an area where there is some skin covering what appears to be [name] new wound on the left dorsal surface of his great toe. This is right at the nail bed. With that being said I do believe that debrided away some of the excess skin can be of benefit in this regard. Otherwise he has been tolerating the dressing changes without complication. 03/14/18; patient arrives today with the multiplicity of wounds that we are following. He has not been systemically unwell Original wound on the left lateral calf now only has 2 small open areas we've been using Hydrofera Blue which should continue The deep tissue injury on the left heel requires debridement today. We've been using silver alginate The left first second toe and the right first second toe are both are reminiscence what I think was tinea pedis. Apparently some of the callus Surface between the toes was removed last week when it started draining. Purulent drainage coming from the wound on the ischial tuberosity on the left. 03/21/18-He is here in follow-up evaluation for multiple wounds. 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 He tolerated debridement. The only change to last week's recommendations is to discontinue antifungal cream between toes. He will follow-up next week  03/28/18; following up for multiple wounds;Concern this week is streaking redness and swelling in the right foot. He is going to need antibiotics for this. 03/31/18; follow-up for right foot cellulitis. Streaking redness and swelling in the  right foot on 03/28/18. 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 I put him on clindamycin 300 mg every 6 and brought him in for [name] quick check. He has an open wound between his first and second toes on the right foot as [name] potential source. 04/04/18; Right foot cellulitis is resolving he is completing clindamycin. This is truly good news Left lateral calf wound which is initial wound only has one small open area inferiorly this is close to healing out. He has compression stockings. We will use Hydrofera Blue right down to the epithelialization of this Nonviable surface on the left heel which was initially pressure with [name] DTI. We've been using Hydrofera Blue. I'[name] going to switch this back to silver alginate Left first second toe/tinea pedis this looks better using silver alginate Right first second toe tinea pedis using silver alginate Large pressure ulcers on theLeft ischial tuberosity. Small wound here Looks better. I am uncertain about the surface over the large wound. Using silver alginate 04/11/18; Cellulitis in the right foot is resolved Left lateral calf wound which was his original wounds still has 2 tiny open areas remaining this is just about closed Nonviable surface on the left heel is better but still requires debridement Left first second toe/tinea pedis still open using silver alginate Right first second toe wound tinea pedis I asked him to go back to using ketoconazole and silver alginate Large pressure ulcers on the left ischial tuberosity this shear injury here is resolved. Wound is smaller. No evidence of infection using silver alginate 04/18/18; Patient arrives with an intense area of cellulitis in the right mid lower calf extending into the right heel area. Bright red and warm. Smaller area on the left anterior leg. He has [name] significant history of MRSA. He will definitely need antibioticsdoxycycline He now has 2 open areas on the left ischial tuberosity the  original large wound and now [name] satellite area which I think was above his initial satellite areas. Not [name] wonderful surface on this satellite area surrounding erythema which looks like pressure related. His left lateral calf wound again his original wound is just about closed Left heel pressure injury still requiring debridement Left first second toe looks [name] lot better using silver alginate Right first second toe also using silver alginate and ketoconazole cream also looks better 04/20/18; the patient was worked in early today out of concerns with his cellulitis on the right leg. I had started him on doxycycline. This was 2 days ago. His wife was concerned about the swelling in the area. Also concerned about the left buttock. He has not been systemically unwell no fever chills. No nausea vomiting or diarrhea 04/25/18; the patient's left buttock wound is continued to deteriorate he is using Hydrofera Blue. He is still completing clindamycin for the cellulitis on the right leg although all of this looks better. 05/02/18 Left buttock wound still with [name] lot of drainage and [name] very tightly adherent fibrinous necrotic surface. He has [name] deeper area superiorly The left lateral calf wound is still closed DTI wound on the left heel necrotic surface especially the circumference using Iodoflex Areas between his left first second toe and right first second toe both look better. Dorsally and the right first second toe he  had [name] necrotic surface although at smaller. In using silver alginate and ketoconazole. I did [name] culture last week which was [name] deep tissue culture of the reminiscence of the open wound on the right first second toe dorsally. This grew [name] few Acinetobacter and [name] few methicillin-resistant staph aureus. Nevertheless the area actually this week looked better. I didn't feel the need to specifically address this at least in terms of systemic antibiotics. 05/09/18; wounds are measuring larger more drainage per  our intake. We are using Santyl covered with alginate on the large superficial buttock wounds, Iodosorb on the left heel, ketoconazole and silver alginate to the dorsal first and second toes bilaterally. 05/16/18; The area on his left buttock better in some aspects although the area superiorly over the ischial tuberosity required an extensive debridement.using Santyl Left heel appears stable. Using Iodoflex The areas between his first and second toes are not bad however there is spreading erythema up the dorsal aspect of his left foot this looks like cellulitis again. He is insensate the erythema is really very brilliant.o Erysipelas He went to see an allergist days ago because he was itching part of this he had lab work done. This showed [name] white count of 15.1 with 70% neutrophils. Hemoglobin of 11.4 and [name] platelet count of 659,000. Last white count we had in Epic was [name] 2-1/2 years ago which was 25.9 but he was ill at the time. He was able to show me some lab work that was done by his primary physician the pattern is about the same. 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 But prompted me to go ahead and do x-rays of both feet and the pelvis rule out osteomyelitis. He also had [name] comprehensive metabolic panel this was reasonably normal his albumin was 3.7 liver function tests BUN/creatinine all normal 05/23/18; x-rays of both his feet from last week were negative for underlying pulmonary abnormality. The x-ray of his pelvis however showed mild irregularity in the left ischial which may represent some early osteomyelitis. The wound in the left ischial continues to get deeper clearly now exposed muscle. Each week necrotic surface material over this area. Whereas the rest of the wounds do not look so bad. The left ischial wound we have been using Santyl and calcium alginate T the left heel surface necrotic debris using Iodoflex o The left lateral leg is still  healed Areas on the left dorsal foot and the right dorsal foot are about the same. There is some inflammation on the left which might represent contact dermatitis, fungal dermatitis I am doubtful cellulitis although this looks better than last week 05/30/18; CT scan done at Hospital did not show any osteomyelitis or abscess. Suggested the possibility of underlying cellulitis although I don't see [name] lot of evidence of this at the bedside The wound itself on the left buttock/upper thigh actually looks somewhat better. No debridement Left heel also looks better no debridement continue Iodoflex Both dorsal first second toe spaces appear better using Lotrisone. Left still required debridement 06/06/18; Intake reported some purulent looking drainage from the left gluteal wound. Using Santyl and calcium alginate Left heel looks better although still [name] nonviable surface requiring debridement The left dorsal foot first/second webspace actually expanding and somewhat deeper. I may consider doing [name] shave biopsy of this area Right dorsal foot first/second webspace appears stable to improved. Using Lotrisone and silver alginate to both these areas 06/13/18 Left gluteal surface looks  better. Now separated in the 2 wounds. No debridement required. Still drainage. We'll continue silver alginate Left heel continues to look better with Iodoflex continue this for at least another week Of his dorsal foot wounds the area on the left still has some depth although it looks better than last week. We've been using Lotrisone and silver alginate 06/20/18 Left gluteal continues to look better healthy tissue Left heel continues to look better healthy granulation wound is smaller. He is using Iodoflex and his long as this continues continue the Iodoflex Dorsal right foot looks better unfortunately dorsal left foot does not. There is swelling and erythema of his forefoot. He had minor trauma to this several days ago but doesn't  think this was enough to have caused any tissue injury. Foot looks like cellulitis, we have had this problem before 06/27/18 on evaluation today patient appears to be doing [name] little worse in regard to his foot ulcer. 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 Both of which would really be his only options for treating this infection. In the past he has been given and effusion of Orbactiv. This is done very well for him in the past again it's one time dosing IV antibiotic therapy. Subsequently I do believe this is something we're gonna need to see about doing at this point in time. Currently his other wounds seem to be doing somewhat better in my pinion I'[name] pretty happy in that regard. 07/03/18 on evaluation today patient's wounds actually appear to be doing fairly well. He has been tolerating the dressing changes without complication. All in all he seems to be showing signs of improvement. In regard to the antibiotics he has been dealing with infectious disease since I saw him last week as far as getting this scheduled. In the end he's going to be [facility] this coming Friday. In the meantime he has been continuing to perform the dressing changes in such as previous. There does not appear to be any evidence of infection worsengin at this time. 07/10/18; Since I last saw this man 2 [name] ago things have actually improved. IV antibiotics of resulted in less forefoot erythema although there is still some present. He is not systemically unwell Left buttock wounds 2 now have no depth there is increased epithelialization Using silver alginate Left heel still requires debridement using Iodoflex Left dorsal foot still with [name] sizable wound about the size of [name] border but healthy granulation Right dorsal foot still with [name]  slitlike area using silver alginate 07/18/18; the patient's cellulitis in the left foot is improved in fact I think it is on its way to resolving. Left buttock wounds 2 both look better although the larger one has hypertension granulation we've been using silver alginate Left heel has some thick circumferential redundant skin over the wound edge which will need to be removed today we've been using Iodoflex Left dorsal foot is still [name] sizable wound required debridement using silver alginate The right dorsal foot is just about closed only [name] small open area remains here 07/25/18; left foot cellulitis is resolved Left buttock wounds 2 both look better. Hyper-granulation on the major area Left heel as some debris over the surface but otherwise looks [name] healthier wound. Using silver collagen Right dorsal foot is just about closed 07/31/18; arrives with  our intake nurse worried about purulent drainage from the buttock. We had hyper-granulation here last week His buttock wounds 2 continue to look better Left heel some debris over the surface but measuring smaller. Right dorsal foot unfortunately has openings between the toes Left foot superficial wound looks less aggravated. 08/07/18 Buttock wounds continue to look better although some of her granulation and the larger medial wound. silver alginate Left heel continues to look [name] lot better.silver collagen Left foot superficial wound looks less stable. Requires debridement. He has [name] new wound superficial area on the foot on the lateral dorsal foot. Right foot looks better using silver alginate without Lotrisone 08/14/2018; patient was in the ER last week diagnosed with [name] UTI. He is now on Cefpodoxime and Macrodantin. Buttock wounds continued to be smaller. Using silver alginate Left heel continues to look better using silver collagen Left foot superficial wound looks as though it is improving Right dorsal foot area is just about healed. 08/21/2018; patient is  completed his antibiotics for his UTI. He has 2 open areas on the buttocks. There is still not closed although the surface looks satisfactory. Using silver alginate Left heel continues to improve using silver collagen The bilateral dorsal foot areas which are at the base of his first and second toes/possible tinea pedis are actually stable on the left but worse on the right. The area on the left required debridement of necrotic surface. After debridement I obtained [name] specimen for PCR culture. The right dorsal foot which is been just about healed last week is now reopened 08/28/2018; culture done on the left dorsal foot showed coag negative staph both staph epidermidis and Lugdunensis. I think this is worthwhile initiating systemic treatment. I will use doxycycline given his long list of allergies. The area on the left heel slightly improved but still requiring debridement. The large wound on the buttock is just about closed whereas the smaller one is larger. Using silver alginate in this area 09/04/2018; patient is completing his doxycycline for the left foot although this continues to be [name] very difficult wound area with very adherent necrotic debris. We are using silver alginate to all his wounds right foot left foot and the small wounds on his buttock, silver collagen on the left heel. 09/11/2018; once again this patient has intense erythema and swelling of the left forefoot. Lesser degrees of erythema in the right foot. He has [name] long list of allergies and intolerances. I will reinstitute doxycycline. 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 Using silver alginate Left heel also looks better using silver collagen Unfortunately both the areas on his feet look worse. The area on the left first second webspace is now gone through to the plantar part of his foot. The area on the left foot anteriorly is irritated with erythema and swelling in the  forefoot. 09/25/2018 His wound on the left plantar heel looks better. Using silver collagen The area on the left buttock 2 small remnant areas. One is closed one is still open. Using silver alginate The areas between both his first and second toes look worse. This in spite of long-standing antifungal therapy with ketoconazole and silver alginate which should have antifungal activity He has small areas around his original wound on the left calf one is on the bottom of the original scar tissue and one superiorly both of these are small and superficial but again given wound history in this site this is  worrisome 10/02/2018 Left plantar heel continues to gradually contract using silver collagen Left buttock wound is unchanged using silver alginate The areas on his dorsal feet between his first and second toes bilaterally look about the same. I prescribed clindamycin ointment to see if we can address chronic staph colonization and also the underlying possibility of erythrasma The left lateral lower extremity wound is actually on the lateral part of his ankle. Small open area here. We have been using silver alginate 10/09/2018; Left plantar heel continues to look healthy and contract. No debridement is required Left buttock slightly smaller with [name] tape injury wound just below which was new this week Dorsal feet somewhat improved I have been using clindamycin Left lateral looks lower extremity the actual open area looks worse although [name] lot of this is epithelialized. I am going to change to silver collagen today He has [name] lot more swelling in the right leg although this is not pitting not red and not particularly warm there is [name] lot of spasm in the right leg usually indicative of people with paralysis of some underlying discomfort. We have reviewed his vascular status from 2017 he had [name] left greater saphenous vein ablation. I wonder about referring him back to vascular surgery if the area on the left  leg continues to deteriorate. 10/16/2018 in today for follow-up and management of multiple lower extremity ulcers. His left Buttock wound is much lower smaller and almost closed completely. The wound to the left ankle has began to reopen with Epithelialization and some adherent slough. He has multiple new areas to the left foot and leg. The left dorsal foot without much improvement. Wound present between left great webspace and 2nd toe. Erythema and edema present right leg. Right LE ultrasound obtained on 10/10/18 was negative for DVT . 10/23/2018; Left buttock is closed over. Still dry macerated skin but there is no open wound. I suspect this is chronic pressure/moisture Left lateral calf is quite [name] bit worse than when I saw this last. There is clearly drainage here he has macerated skin into the left plantar heel. We will change the primary dressing to alginate Left dorsal foot has some improvement in overall wound area. Still using clindamycin and silver alginate Right dorsal foot about the same as the left using clindamycin and silver alginate The erythema in the right leg has resolved. He is DVT rule out was negative Left heel pressure area required debridement although the wound is smaller and the surface is health 10/26/2018 The patient came back in for his nurse check today predominantly because of the drainage coming out of the left lateral leg with [name] recent reopening of his original wound on the left lateral calf. He comes in today with [name] large amount of surrounding erythema around the wound extending from the calf into the ankle and even in the area on the dorsal foot. He is not systemically unwell. He is not febrile. Nevertheless this looks like cellulitis. We have been using silver alginate to the area. I changed him to [name] regular visit and I am going to prescribe him doxycycline. The rationale here is [name] long list of medication intolerances and [name] history of MRSA. I did not see anything  that I thought would provide [name] valuable culture 10/30/2018 Follow-up from his appointment 4 days ago with really an extensive area of cellulitis in the left calf left lateral ankle and left dorsal foot. I put him on doxycycline. He has [name] long list of medication allergies  which are true allergy reactions. 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 In any case he is [name] lot better today. The erythema especially in the anterior and lateral left calf is better. He still has left ankle erythema. He also is complaining about increasing edema in the right leg we have only been using Kerlix Coban and he has been doing the wraps at home. Finally he has [name] spotty rash on the medial part of his upper left calf which looks like folliculitis or perhaps wrap occlusion type injury. Small superficial macules not pustules 11/06/18 patient arrives today with again [name] considerable degree of erythema around the wound on the left lateral calf extending into the dorsal ankle and dorsal foot. This is [name] lot worse than when I saw this last week. He is on doxycycline really with not [name] lot of improvement. He has not been systemically unwell Wounds on the; left heel actually looks improved. Original area on the left foot and proximity to the first and second toes looks about the same. He has superficial areas on the dorsal foot, anterior calf and then the reopening of his original wound on the left lateral calf which looks about the same The only area he has on the right is the dorsal webspace first and second which is smaller. He has [name] large area of dry erythematous skin on the left buttock small open area here. 11/13/2018; the patient arrives in much better condition. The erythema around the wound on the left lateral calf is [name] lot better. 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 In any case this is [name] lot better. The area on the left heel is very small and just about resolved using silver collagen we have been using silver alginate to the areas on his dorsal feet 11/20/2018; his wounds include the left lateral calf, left heel, dorsal aspects of both feet just proximal to the first second webspace. He is stable to slightly improved. I did not think any changes to his dressings were going to be necessary 11/27/2018 he has [name] reopening on the left buttock which is surrounded by what looks like tinea or perhaps some other form of dermatitis. The area on the left dorsal foot has some erythema around it I have marked this area but I am not sure whether this is cellulitis or not. Left heel is not closed. Left calf the reopening is really slightly longer and probably worse 1/13; in general things look better and smaller except for the left dorsal foot. Area on the left heel is just about closed, left buttock looks better only [name] small wound remains in the skin looks better [using Lotrisone] 1/20; the area on the left heel only has [name] few remaining open areas here. Left lateral calf about the same in terms of size, left dorsal foot slightly larger right lateral foot still not closed. The area on the left buttock has no open wound and the surrounding skin looks [name] lot better 1/27; the area on the left heel is closed. Left lateral calf better but still requiring extensive debridements. The area on his left buttock is closed. He still has the open areas on the left dorsal foot which is slightly smaller in the right foot which is slightly expanded. We have been using Iodoflex on these areas as well 2/3; left heel is  closed. Left lateral calf still requiring debridement using Iodoflex there is no open area on his left buttock however he has dry scaly skin over [name] large area of this. Not really responding well to the Lotrisone. Finally the areas on his dorsal feet at the level of the  first second webspace are slightly smaller on the right and about the same on the left. Both of these vigorously debrided with Anasept and gauze 2/10; left heel remains closed he has dry erythematous skin over the left buttock but there is no open wound here. Left lateral leg has come in and with. Still requiring debridement we have been using Iodoflex here. Finally the area on the left dorsal foot and right dorsal foot are really about the same extremely dry callused fissured areas. He does not yet have [name] dermatology appointment 2/17; left heel remains closed. He has [name] new open area on the left buttock. The area on the left lateral calf is bigger longer and still covered in necrotic debris. No major change in his foot areas bilaterally. I am awaiting for [name] dermatologist to look on this. We have been using ketoconazole I do not know that this is been doing any good at all. 2/24; left heel remains closed. The left buttock wound that was new reopening last week looks better. The left lateral calf appears better also although still requires debridement. The major area on his foot is the left first second also requiring debridement. We have been putting Prisma on all wounds. I do not believe that the ketoconazole has done too much good for his feet. He will use Lotrisone I am going to give him [name] 2-week course of terbinafine. We still do not have [name] dermatology appointment 3/2 left heel remains closed however there is skin over bone in this area I pointed this out to him today. The left buttock wound is epithelialized but still does not look completely stable. The area on the left leg required debridement were using silver collagen here. With regards to his feet we changed to Lotrisone last week and silver alginate. 3/9; left heel remains closed. Left buttock remains closed. The area on the right foot is essentially closed. The left foot remains unchanged. Slightly smaller on the left lateral calf. Using  silver collagen to both of these areas 3/16-Left heel remains closed. Area on right foot is closed. Left lateral calf above the lateral malleolus open wound requiring debridement with easy bleeding. Left dorsal wound proximal to first toe also debrided. Left ischial area open new. Patient has been using Prisma with wrapping every 3 days. Dermatology appointment is apparently tomorrow.Patient has completed his terbinafine 2-week course with some apparent improvement according to him, there is still flaking and dry skin in his foot on the left 3/23; area on the right foot is reopened. The area on the left anterior foot is about the same still [name] very necrotic adherent surface. He still has the area on the left leg and reopening is on the left buttock. He apparently saw dermatology although I do not have [name] note. According to the patient who is usually fairly well informed they did not have any good ideas. Put him on oral terbinafine which she is been on before. 3/30; using silver collagen to all wounds. Apparently his dermatologist put him on doxycycline and rifampin presumably some culture grew staph. I do not have this result. He remains on terbinafine although I have used terbinafine on him before 4/6; patient has had  [name] fairly substantial reopening on the right foot between the first and second toes. He is finished his terbinafine and I believe is on doxycycline and rifampin still as prescribed by dermatology. We have been using silver collagen to all his wounds although the patient reports that he thinks silver alginate does better on the wounds on his buttock. 4/13; the area on his left lateral calf about the same size but it did not require debridement. Left dorsal foot just proximal to the webspace between the first and second toes is about the same. Still nonviable surface. I note some superficial bronze discoloration of the dorsal part of his foot Right dorsal foot just proximal to the first  and second toes also looks about the same. I still think there may be the same discoloration I noted above on the left Left buttock wound looks about the same 4/20; left lateral calf appears to be gradually contracting using silver collagen. 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 The left dorsal foot wound is debrided of tightly adherent necrotic debris and really cleans up quite nicely. The right area is worse with expansion. I did not debride this it is now over the base of the second toe The area on his left buttock is smaller no debridement is required using silver collagen 5/4; left calf continues to make good progress. He arrives with erythema around the wounds on his dorsal foot which even extends to the plantar aspect. Very concerning for coexistent infection. He is finished the erythromycin I gave him for possible erythrasma this does not seem to have helped. The area on the left foot is about the same base of the dorsal toes Is area on the buttock looks improved on the left 5/11; left calf and left buttock continued to make good progress. Left foot is about the same to slightly improved. Major problem is on the right foot. He has not had an x-ray. Deep tissue culture I did last week showed both Enterobacter and E. coli. I did not change the doxycycline I put him on empirically although neither 1 of these were plated to doxycycline. He arrives today with the erythema looking worse on both the dorsal and plantar foot. Macerated skin on the bottom of the foot. he has not been systemically unwell 5/18-Patient returns at 1 week, left calf wound appears to be making some progress, left buttock wound appears slightly worse than last time, left foot wound looks slightly better, right foot redness is marginally better. X-ray of both feet show no air or evidence of osteomyelitis. Patient is finished his Omnicef and terbinafine. He  continues to have macerated skin on the bottom of the left foot as well as right 5/26; left calf wound is better, left buttock wound appears to have multiple small superficial open areas with surrounding macerated skin. X-rays that I did last time showed no evidence of osteomyelitis in either foot. He is finished cefdinir and doxycycline. I do not think that he was on terbinafine. He continues to have [name] large superficial open area on the right foot anterior dorsal and slightly between the first and second toes. I did send him to dermatology 2 months ago or so wondering about whether they would do [name] fungal scraping. I do not believe they did but did do [name] culture. We have been using silver alginate to the toe areas, he has been using antifungals at home topically either ketoconazole or Lotrisone. We are using silver  collagen on the left foot, silver alginate on the right, silver collagen on the left lateral leg and silver alginate on the left buttock 6/1; left buttock area is healed. We have the left dorsal foot, left lateral leg and right dorsal foot. We are using silver alginate to the areas on both feet and silver collagen to the area on his left lateral calf 6/8; the left buttock apparently reopened late last week. He is not really sure how this happened. He is tolerating the terbinafine. Using silver alginate to all wounds 6/15; left buttock wound is larger than last week but still superficial. Came in the clinic today with [name] report of purulence from the left lateral leg I did not identify any infection Both areas on his dorsal feet appear to be better. He is tolerating the terbinafine. Using silver alginate to all wounds 6/22; left buttock is about the same this week, left calf quite [name] bit better. His left foot is about the same however he comes in with erythema and warmth in the right forefoot once again. Culture that I gave him in the beginning of May showed Enterobacter and E. coli. I gave him  doxycycline and things seem to improve although neither 1 of these organisms was specifically plated. 6/29; left buttock is larger and dry this week. Left lateral calf looks to me to be improved. Left dorsal foot also somewhat improved right foot completely unchanged. The erythema on the right foot is still present. He is completing the Ceftin dinner that I gave him empirically [see discussion above.) 7/6 - All wounds look to be stable and perhaps improved, the left buttock wound is slightly smaller, per patient bleeds easily, completed ceftin, the right foot redness is less, he is on terbinafine 7/13; left buttock wound about the same perhaps slightly narrower. Area on the left lateral leg continues to narrow. Left dorsal foot slightly smaller right foot about the same. We are using silver alginate on the right foot and Hydrofera Blue to the areas on the left. Unna boot on the left 2 layer compression on the right 7/20; left buttock wound absolutely the same. Area on lateral leg continues to get better. Left dorsal foot require debridement as did the right no major change in the 7/27; left buttock wound the same size necrotic debris over the surface. The area on the lateral leg is closed once again. His left foot looks better right foot about the same although there is some involvement now of the posterior first second toe area. He is still on terbinafine which I have given him for [name] month, not certain [name] centimeter major change 06/25/19-All wounds appear to be slightly improved according to report, left buttock wound looks clean, both foot wounds have minimal to no debris the right dorsal foot has minimal slough. We are using Hydrofera Blue to the left and silver alginate to the right foot and ischial wound. 8/10-Wounds all appear to be around the same, the right forefoot distal part has some redness which was not there before, however the wound looks clean and small. Ischial wound looks about the  same with no changes 8/17; his wound on the left lateral calf which was his original chronic venous insufficiency wound remains closed. Since I last saw him the areas on the left dorsal foot right dorsal foot generally appear better but require debridement. The area on his left initial tuberosity appears somewhat larger to me perhaps hyper granulated and bleeds very easily. We have been  using Hydrofera Blue to the left dorsal foot and silver alginate to everything else 8/24; left lateral calf remains closed. The areas on his dorsal feet on the webspace of the first and second toes bilaterally both look better. The area on the left buttock which is the pressure ulcer stage II slightly smaller. I change the dressing to Hydrofera Blue to all areas 8/31; left lateral calf remains closed. The area on his dorsal feet bilaterally look better. Using Hydrofera Blue. Still requiring debridement on the left foot. No change in the left buttock pressure ulcers however 9/14; left lateral calf remains closed. Dorsal feet look quite [name] bit better than 2 [name] ago. Flaking dry skin also [name] lot better with the ammonium lactate I gave him 2 [name] ago. The area on the left buttock is improved. He states that his Roho cushion developed [name] leak and he is getting [name] new one, in the interim he is offloading this vigorously 9/21; left calf remains closed. Left heel which was [name] possible DTI looks better this week. He had macerated tissue around the left dorsal foot right foot looks satisfactory and improved left buttock wound. I changed his dressings to his feet to silver alginate bilaterally. Continuing Hydrofera Blue on the left buttock. 9/28 left calf remains closed. Left heel did not develop anything [possible DTI] dry flaking skin on the left dorsal foot. Right foot looks satisfactory. Improved left buttock wound. We are using silver alginate on his feet Hydrofera Blue on the buttock. I have asked him to go back to the  Lotrisone on his feet including the wounds and surrounding areas 10/5; left calf remains closed. The areas on the left and right feet about the same. [name] lot of this is epithelialized however debris over the remaining open areas. He is using Lotrisone and silver alginate. The area on the left buttock using Hydrofera Blue 10/26. Patient has been out for 3 [name] secondary to Covid concerns. He tested negative but I think his wife tested positive. He comes in today with the left foot substantially worse, right foot about the same. Even more concerning he states that the area on his left buttock closed over but then reopened and is considerably deeper in one aspect than it was before [stage III wound] 11/2; left foot really about the same as last week. Quarter sized wound on the dorsal foot just proximal to the first second toes. Surrounding erythema with areas of denuded epithelium. This is not really much different looking. Did not look like cellulitis this time however. Right foot area about the same.. We have been using silver alginate alginate on his toes Left buttock still substantial irritated skin around the wound which I think looks somewhat better. We have been using Hydrofera Blue here. 11/9; left foot larger than last week and [name] very necrotic surface. Right foot I think is about the same perhaps slightly smaller. Debris around the circumference also addressed. Unfortunately on the left buttock there is been [name] decline. Satellite lesions below the major wound distally and now [name] an additional one posteriorly we have been using Hydrofera Blue but I think this is [name] pressure issue 11/16; left foot ulcer dorsally again [name] very adherent necrotic surface. Right foot is about the same. Not much change in the pressure ulcer on his left buttock. 11/30; left foot ulcer dorsally basically the same as when I saw him 2 [name] ago. Very adherent fibrinous debris on the wound surface. Patient reports [name] lot  of  drainage as well. 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 We have been using Iodoflex, patient changed back to alginate because of the drainage. Area on his right dorsal foot really looks benign with [name] healthier surface certainly [name] lot better than on the left. Left buttock wounds all improved using Hydrofera Blue 12/7; left dorsal foot again no improvement. Tightly adherent debris. PCR culture I did last week only showed likely skin contaminant. I have gone ahead and done [name] punch biopsy of this which is about the last thing in terms of investigations I can think to do. He has known venous insufficiency and venous hypertension and this could be the issue here. The area on the right foot is about the same left buttock slightly worse according to our intake nurse secondary to [facility] Blue sticking to the wound 12/14; biopsy of the left foot that I did last time showed changes that could be related to wound healing/chronic stasis dermatitis phenomenon no neoplasm. We have been using silver alginate to both feet. I change the one on the left today to Sorbact and silver alginate to his other 2 wounds 12/28; the patient arrives with the following problems; Major issue is the dorsal left foot which continues to be [name] larger deeper wound area. Still with [name] completely nonviable surface Paradoxically the area mirror image on the right on the right dorsal foot appears to be getting better. He had some loss of dry denuded skin from the lower part of his original wound on the left lateral calf. Some of this area looked [name] little vulnerable and for this reason we put him in wrap that on this side this week The area on his left buttock is larger. He still has the erythematous circular area which I think is [name] combination of pressure, sweat. This does not look like cellulitis or fungal dermatitis 11/26/2019; -Dorsal left foot large open wound with depth. Still  debris over the surface. Using Sorbact The area on the dorsal right foot paradoxically has closed over He has [name] reopening on the left ankle laterally at the base of his original wound that extended up into the calf. This appears clean. The left buttock wound is smaller but with very adherent necrotic debris over the surface. We have been using silver alginate here as well The patient had arterial studies done in 2017. He had biphasic waveforms at the dorsalis pedis and posterior tibial bilaterally. ABI in the left was 1.17. Digit waveforms were dampened. He has slight spasticity in the great toes I do not think [name] TBI would be possible 1/11; the patient comes in today with [name] sizable reopening between the first and second toes on the right. This is not exactly in the same location where we have been treating wounds previously. According to our intake nurse this was actually fairly deep but 0.6 cm. The area on the left dorsal foot looks about the same the surface is somewhat cleaner using Sorbact, his MRI is in 2 days. We have not managed yet to get arterial studies. The new reopening on the left lateral calf looks somewhat better using alginate. The left buttock wound is about the same using alginate 1/18; the patient had his ARTERIAL studies which were quite normal. ABI in the right at 1.13 with triphasic/biphasic waveforms on the left ABI 1.06 again with triphasic/biphasic waveforms. It would not have been possible to have done [name] toe brachial index because of spasticity. We have  been using Sorbac to the left foot alginate to the rest of his wounds on the right foot left lateral calf and left buttock 1/25; arrives in clinic with erythema and swelling of the left forefoot worse over the first MTP area. This extends laterally dorsally and but also posteriorly. Still has an area on the left lateral part of the lower part of his calf wound it is eschared and clearly not closed. Area on the left buttock  still with surrounding irritation and erythema. Right foot surface wound dorsally. The area between the right and first and second toes appears better. 2/1; The left foot wound is about the same. Erythema slightly better I gave him [name] week of doxycycline empirically Right foot wound is more extensive extending between the toes to the plantar surface Left lateral calf really no open surface on the inferior part of his original wound however the entire area still looks vulnerable Absolutely no improvement in the left buttock wound required debridement. 2/8; the left foot is about the same. Erythema is slightly improved I gave him clindamycin last week. Right foot looks better he is using Lotrimin and silver alginate He has [name] breakdown in the left lateral calf. Denuded epithelium which I have removed Left buttock about the same were using Hydrofera Blue 2/15; left foot is about the same there is less surrounding erythema. Surface still has tightly adherent debris which I have debriding however not making any progress Right foot has [name] substantial wound on the medial right second toe between the first and second webspace. Still an open area on the left lateral calf distal area. Buttock wound is about the same 2/22; left foot is about the same less surrounding erythema. Surface has adherent debris. Polymen Ag Right foot area significant wound between the first and second toes. We have been using silver alginate here Left lateral leg polymen Ag at the base of his original venous insufficiency wound Left buttock some improvement here 3/1; Right foot is deteriorating in the first second toe webspace. Larger and more substantial. We have been using silver alginate. Left dorsal foot about the same markedly adherent surface debris using PolyMem Ag Left lateral calf surface debris using PolyMem AG Left buttock is improved again using PolyMem Ag. He is completing his terbinafine. The erythema in the foot  seems better. He has been on this for 2 [name] 3/8; no improvement in any wound area in fact he has [name] small open area on the dorsal midfoot which is new this week. He has not gotten his foot x-rays yet 3/15; his x-rays were both negative for osteomyelitis of both feet. No major change in any of his wounds on the extremities however his buttock wounds are better. We have been using polymen on the buttocks, left lower leg. Iodoflex on the left foot and silver alginate on the right 3/22; arrives in clinic today with the 2 major issues are the improvement in the left dorsal foot wound which for once actually looks healthy with [name] nice healthy wound surface without debridement. Using Iodoflex here. Unfortunately on the left lateral calf which is in the distal part of his original wound he came to the clinic here for there was purulent drainage noted some increased breakdown scattered around the original area and [name] small area proximally. We we are using polymen here will change to silver alginate today. His buttock wound on the left is better and I think the area on the right first second toe webspace is  also improved 3/29; left dorsal foot looks better. Using Iodoflex. Left ankle culture from deterioration last time grew E. coli, Enterobacter and Enterococcus. I will give him [name] course of cefdinir although that will not cover Enterococcus. The area on the right foot in the webspace of the first and second toe lateral first toe looks better. The area on his buttock is about healed Vascular appointment is on April 21. This is to look at his venous system vis--vis continued breakdown of the wounds on the left including the left lateral leg and left dorsal foot he. He has had previous ablations on this side 4/5; the area between the right first and second toes lateral aspect of the first toe looks better. Dorsal aspect of the left first toe on the left foot also improved. Unfortunately the left lateral lower leg  is larger and there is [name] second satellite wound superiorly. The usual superficial abrasions on the left buttock overall better but certainly not closed 4/12; the area between the right first and second toes is improved. Dorsal aspect of the left foot also slightly smaller with [name] vibrant healthy looking surface. No real change in the left lateral leg and the left buttock wound is healed He has an unaffordable co-pay for Apligraf. Appointment with vein and vascular with regards to the left leg venous part of the circulation is on 4/21 4/19; we continue to see improvement in all wound areas. Although this is minor. He has his vascular appointment on 4/21. The area on the left buttock has not reopened although right in the center of this area the skin looks somewhat threatened 4/26; the left buttock is unfortunately reopened. In general his left dorsal foot has [name] healthy surface and looks somewhat smaller although it was not measured as such. The area between his first and second toe webspace on the right as [name] small wound against the first toe. The patient saw vascular surgery. The real question I was asking was about the small saphenous vein on the left. He has previously ablated left greater saphenous vein. Nothing further was commented on on the left. Right greater saphenous vein without reflux at the saphenofemoral junction or proximal thigh there was no indication for ablation of the right greater saphenous vein duplex was negative for DVT bilaterally. They did not think there was anything from [name] vascular surgery point of view that could be offered. They ABIs within normal limits 5/3; only small open area on the left buttock. The area on the left lateral leg which was his original venous reflux is now 2 wounds both which look clean. We are using Iodoflex on the left dorsal foot which looks healthy and smaller. He is down to [name] very tiny area between the right first and second toes, using  silver alginate 5/10; all of his wounds appear better. We have much better edema control in 4 layer compression on the left. This may be the factor that is allowing the left foot and left lateral calf to heal. He has external compression garments at home 04/14/20-All of his wounds are progressing well, the left forefoot is practically closed, left ischium appears to be about the same, right toe webspace is also smaller. The left lateral leg is about the same, continue using Hydrofera Blue to this, silver alginate to the ischium, Iodoflex to the toe space on the right 6/7; most of his wounds outside of the left buttock are doing well. The area on the left lateral calf and left  dorsal foot are smaller. The area on the right foot in between the first and second toe webspace is barely visible although he still says there is some drainage here is the only reason I did not heal this out. Unfortunately the area on the left buttock almost looks like he has [name] skin tear from tape. He has open wound and then [name] large flap of skin that we are trying to get adherence over an area just next to the remaining wound 6/21; 2 week follow-up. I believe is been here for nurse visits. Miraculously the area between his first and second toes on the left dorsal foot is closed over. Still open on the right first second web space. The left lateral calf has 2 open areas. Distally this is more superficial. The proximal area had [name] little more depth and required debridement of adherent necrotic material. His buttock wound is actually larger we have been using silver alginate here 6/28; the patient's area on the left foot remains closed. Still open wet area between the first and second toes on the right and also extending into the plantar aspect. We have been using silver alginate in this location. He has 2 areas on the left lower leg part of his original long wounds which I think are better. We have been using Hydrofera Blue here.  Hydrofera Blue to the left buttock which is stable 7/12; left foot remains closed. Left ankle is closed. May be [name] small area between his right first and second toes the only truly open area is on the left buttock. We have been using Hydrofera Blue here 7/19; patient arrives with marked deterioration especially in the left foot and ankle. We did not put him in [name] compression wrap on the left last week in fact he wore his juxta lite stockings on either side although he does not have an underlying stocking. He has [name] reopening on the left dorsal foot, left lateral ankle and [name] new area on the right dorsal ankle. More worrisome is the degree of erythema on the left foot extending on the lateral foot into the lateral lower leg on the left 7/26; the patient had erythema and drainage from the lateral left ankle last week. Culture of this grew MRSA resistant to doxycycline and clindamycin which are the 2 antibiotics we usually use with this patient who has multiple antibiotic allergies including linezolid, trimethoprim sulfamethoxazole. I had give him an empiric doxycycline and he comes in the area certainly looks somewhat better although it is blotchy in his lower leg. He has not been systemically unwell. He has had areas on the left dorsal foot which is [name] reopening, chronic wounds on the left lateral ankle. Both of these I think are secondary to chronic venous insufficiency. The area between his first and second toes is closed as far as I can tell. He had [name] new wrap injury on the right dorsal ankle last week. Finally he has an area on the left buttock. We have been using silver alginate to everything except the left buttock we are using Hydrofera Blue 06/30/20-Patient returns at 1 week, has been given [name] sample dose pack of NUZYRA which is [name] tetracycline derivative [omadacycline], patient has completed those, we have been using silver alginate to almost all the wounds except the left ischium where we are using  Hydrofera Blue all of them look better 8/16; since I last saw the patient he has been doing well. The area on the left buttock, left lateral  ankle and left foot are all closed today. He has completed the [place] I gave him last time and tolerated this well. He still has open areas on the right dorsal ankle and in the right first second toe area which we are using silver alginate. 8/23; we put him in his bilateral external compression stockings last week as he did not have anything open on either leg except for concerning area between the right first and second toe. He comes in today with an area on the left dorsal foot slightly more proximal than the original wound, the left lateral foot but this is actually [name] continuation of the area he had on the left lateral ankle from last time. As well he is opened up on the left buttock again. 8/30; comes in today with things looking [name] lot better. The area on the left lower ankle has closed down as has the left foot but with eschar in both areas. The area on the dorsal right ankle is also epithelialized. Very little remaining of the left buttock wound. We have been using silver alginate on all wound areas 9/13; the area in the first second toe webspace on the right has fully epithelialized. He still has some vulnerable epithelium on the right and the ankle and the dorsal foot. He notes weeping. He is using his juxta lite stocking. On the left again the left dorsal foot is closed left lateral ankle is closed. We went to the juxta lite stocking here as well. Still vulnerable in the left buttock although only 2 small open areas remain here 9/27; 2-week follow-up. We did not look at his left leg but the patient says everything is closed. He is [name] bit disturbed by the amount of edema in his left foot he is using juxta lite stockings but asking about over the toes stockings which would be 30/40, will talk to him next time. According to him there is no open wound on  either the left foot or the left ankle/calf He has an open area on the dorsal right calf which I initially point [name] wrap injury. He has superficial remaining wound on the left ischial tuberosity been using silver alginate although he says this sticks to the wound Electronic Signature(s) Signed: 08/19/2020 7:37:15 AM By: Linton Ham MD Entered By: Linton Ham on 08/18/2020 08:42:34 -------------------------------------------------------------------------------- Physical Exam Details Patient Name: Date of Service: [place], [name] 08/18/2020 8:00 [name] Medical Record Number: [id number] Patient Account Number: [id number] Date of Birth/Sex: Treating RN: 12-May-1986 (34 y.o. [name] Primary Care Provider: [place], [place] Other Clinician: Referring Provider: Treating Provider/Extender: [name] in Treatment: 241 Constitutional Sitting or standing Blood Pressure is within target range for patient.. Pulse regular and within target range for patient.Marland Kitchen Respirations regular, non-labored and within target range.. Temperature is normal and within the target range for the patient.Marland Kitchen Appears in no distress. Notes Wound exam; left buttock most of his skin looks better. The remaining wound is very superficial little spots of epithelialization in the middle. On the right dorsal ankle small punched-out area again this is superficial edema control looks fairly good Electronic Signature(s) Signed: 08/19/2020 7:37:15 AM By: Linton Ham MD Entered By: Linton Ham on 08/18/2020 08:44:12 -------------------------------------------------------------------------------- Physician Orders Details Patient Name: Date of Service: [place], [name] 08/18/2020 8:00 [name] Medical Record Number: [id number] Patient Account Number: [id number] Date of Birth/Sex: Treating RN: 12-18-1985 (34 y.o. [name] Primary Care Provider: [name], [name] Other Clinician: Referring  Provider: Treating Provider/Extender: [name] in Treatment: [phone] Verbal / Phone Orders: No Diagnosis Coding ICD-10 Coding Code Description I87.332 Chronic venous hypertension (idiopathic) with ulcer and inflammation of left lower extremity L97.321 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 to breakdown of skin L97.311 Non-pressure chronic ulcer of right ankle limited to breakdown of skin L97.511 Non-pressure chronic ulcer of other part of right foot limited to breakdown of skin L97.521 Non-pressure chronic ulcer of other part of left foot limited to breakdown of 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 Dressing Change Frequency Wound #41R Left Ischium Change Dressing every other day. Wound #46 Right,Dorsal Ankle Change Dressing every other day. Skin Barriers/Peri-Wound Care Moisturizing lotion - both legs daily Wound Cleansing May shower and wash wound with soap and water. - on days that dressing is changed Primary Wound Dressing Wound #41R Left Ischium Foam Wound #46 Right,Dorsal Ankle Calcium Alginate with Silver Secondary Dressing Wound #41R Left Ischium Foam Border - or ABD pad and tape Wound #46 Right,Dorsal Ankle Foam Border Edema Control Elevate legs to the level of the heart or above for 30 minutes daily and/or when sitting, [name] frequency of: - throughout the day Support Garment 30-40 mm/Hg pressure to: - Juxtalite to both legs daily Off-Loading Low air-loss mattress (Group 2) Roho cushion for wheelchair Turn and reposition every 2 hours - out of wheelchair throughout the day, try to lay on sides, sleep in the bed not the recliner Electronic Signature(s) Signed: 08/18/2020 5:31:38 PM By: Levan Hurst RN, BSN Signed: 08/19/2020 7:37:15 AM By: Linton Ham MD Entered By: Levan Hurst on 08/18/2020  08:32:28 -------------------------------------------------------------------------------- Problem List Details Patient Name: Date of Service: [place], [name] 08/18/2020 8:00 [name] Medical Record Number: [id number] Patient Account Number: [id number] Date of Birth/Sex: Treating RN: Dec 01, 1985 (34 y.o. [name] Primary Care Provider: [place], [place] Other Clinician: Referring Provider: Treating Provider/Extender: [name] in Treatment: 241 Active Problems ICD-10 Encounter Code Description Active Date MDM Diagnosis I87.332 Chronic venous hypertension (idiopathic) with ulcer and inflammation of left 02/25/2020 No Yes lower extremity L97.321 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 to breakdown of skin 11/26/2019 No Yes L97.311 Non-pressure chronic ulcer of right ankle limited to breakdown of skin 06/09/2020 No Yes L97.511 Non-pressure chronic ulcer of other part of right foot limited to breakdown of 08/05/2016 No Yes skin L97.521 Non-pressure chronic ulcer of other part of left foot limited to breakdown of 07/25/2018 No Yes 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, stage 3 09/17/2019 No Yes 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, complete 01/02/2016 No Yes Inactive Problems ICD-10 Code Description Active Date Inactive Date 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, stage 3 01/02/2016 01/02/2016 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, stage 3 12/05/2017 12/05/2017 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 10/07/2016 10/07/2016 L89.302 Pressure ulcer of unspecified buttock, stage 2 03/05/2019 03/05/2019 L03.116 Cellulitis of left lower limb 12/17/2019 12/17/2019 Resolved Problems ICD-10 Code Description Active Date Resolved Date L89.623 Pressure ulcer of left heel, stage 3 01/10/2018 01/10/2018 L03.115 Cellulitis of right lower limb 08/30/2016 08/30/2016 L89.322 Pressure ulcer of left buttock, stage 2 11/27/2018 11/27/2018 L89.322 Pressure ulcer of left  buttock, stage 2 01/08/2019 01/08/2019 B35.3 Tinea pedis 01/10/2018 01/10/2018 L03.116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 07/28/17 better surface today with now 2 [name] of Iodoflex. Significant cellulitis treated with doxycycline. He has now completed the doxycycline and he is back to his usual degree of chronic venous inflammation/stasis dermatitis. He reminds me he has had ablations surgery here 08/04/17; continued improvement with Iodoflex to the left lateral leg wound in terms of the surface of the wound although the dimensions are better. 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 Problematic areas on the plantar aspect of the first second toe web space on the left and the dorsal aspect of the first second toe web space on the right. At one point I felt these were probably related to chronic fungal infections in treated him aggressively for this although we have not made any improvement here. 08/11/17; left lateral leg. Surface continues to improve with the Iodoflex although we are not seeing much improvement in overall wound dimensions. Areas on his plantar left foot and right foot show no improvement. In fact the right foot looks somewhat worse 08/18/17; left lateral leg. We changed to [facility] Blue last week after [name] prolonged course of Iodoflex which helps get the surface better. It appears that the wound with  is improved. Continue with difficult areas on the left dorsal first second and plantar first second on the right 09/01/17; patient arrives in clinic today having had [name] temperature of 103  yesterday. He was seen in the ER and [facility]. The patient was concerned he could have cellulitis again in the right leg however they diagnosed him with [name] UTI and he is now on Keflex. He has [name] history of cellulitis which is been recurrent and difficult but this is been in the left leg, in the past 5 use doxycycline. He does in and out catheterizations at home which are risk factors for UTI 09/08/17; patient will be completing his Keflex this weekend. The erythema on the left leg is considerably better. He has [name] new wound today on the medial part of the right leg small superficial almost looks like [name] skin tear. He has worsening of the area on the right dorsal first and second toe. His major area on the left lateral leg is better. Using Hydrofera Blue on all areas 09/15/17; gradual reduction in width on the long wound in the left lateral leg. No debridement required. He also has wounds on the plantar aspect of his left first second toe web space and on the dorsal aspect of the right first second toe web space. 09/22/17; there continues to be very gradual improvements in the dimensions of the left lateral leg wound. He hasn't round erythematous spot with might be pressure on his wheelchair. There is no evidence obviously of infection no purulence no warmth ooHe has [name] dry scaled area on the plantar aspect of the left first second toe ooImproved area on the dorsal right first second toe. 09/29/17; left lateral leg wound continues to improve in dimensions mostly with an is still [name] fairly long but increasingly narrow wound. ooHe has [name] dry scaled area on the plantar aspect of his left first second toe web space ooIncreasingly concerning area on the dorsal right first second toe. In fact I am concerned today about possible cellulitis around this wound. The areas extending up his second toe and although there is deformities here almost appears to abut on the nailbed. 10/06/17; left lateral leg wound  continues to make very gradual progress. Tissue culture I did from the right first second toe dorsal foot last time grew MRSA and enterococcus which was vancomycin sensitive. This was not sensitive to clindamycin or doxycycline. He is allergic to Zyvox and sulfa we have therefore arrange for him to have dalvance infusion tomorrow. He is had this in the past and tolerated it well 10/20/17; left lateral leg wound continues to make decent progress. This is certainly reduced in terms of with there is advancing epithelialization.ooThe cellulitis in the right foot looks better although he still has [name] deep wound in the dorsal aspect of the first second toe web space. Plantar left first toe web space on the left I think is making some progress 10/27/17; left lateral leg wound continues to make decent progress. Advancing epithelialization.using Hydrofera Blue ooThe right first second toe web space wound is better-looking using silver alginate ooImprovement in the left plantar first second toe web space. Again using silver alginate 11/03/17 left lateral leg wound continues to make decent progress albeit slowly. Using Hydrofera Blue ooThe right per second toe web space continues to be [name] very problematic looking punched out wound. I obtained [name] piece of tissue for deep culture I did extensively treated this for fungus. It is difficult to imagine that this is  [name] pressure area as the patient states other than going outside he doesn't really wear shoes at home ooThe left plantar first second toe web space looked fairly senescent. Necrotic edges. This required debridement oochange to Hydrofera Blue to all wound areas 11/10/17; left lateral leg wound continues to contract. Using Hydrofera Blue ooOn the right dorsal first second toe web space dorsally. Culture I did of this area last week grew MRSA there is not an easy oral option in this patient was multiple antibiotic allergies or intolerances. This was only [name]  rare culture isolate I'[name] therefore going to use Bactroban under silver alginate ooOn the left plantar first second toe web space. Debridement is required here. This is also unchanged 11/17/17; left lateral leg wound continues to contract using Hydrofera Blue this is no longer the major issue. ooThe major concern here is the right first second toe web space. He now has an open area going from dorsally to the plantar aspect. There is now wound on the inner lateral part of the first toe. Not [name] very viable surface on this. There is erythema spreading medially into the forefoot. ooNo major change in the left first second toe plantar wound 11/24/17; left lateral leg wound continues to contract using Hydrofera Blue. Nice improvement today ooThe right first second toe web space all of this looks [name] lot less angry than last week. I have given him clindamycin and topical Bactroban for MRSA and terbinafine for the possibility of underlining tinea pedis that I could not control with ketoconazole. Looks somewhat better ooThe area on the plantar left first second toe web space is weeping with dried debris around the wound 12/01/17; left lateral leg wound continues to contract he Hydrofera Blue. It is becoming thinner in terms of with nevertheless it is making good improvement. ooThe right first second toe web space looks less angry but still [name] large necrotic-looking wounds starting on the plantar aspect of the right foot extending between the toes and now extensively on the base of the right second toe. I gave him clindamycin and topical Bactroban for MRSA anterior benefiting for the possibility of underlying tinea pedis. Not looking better today ooThe area on the left first/second toe looks better. Debrided of necrotic debris 12/05/17* the patient was worked in urgently today because over the weekend he found blood on his incontinence bad when he woke up. He was found to have an ulcer by his wife who does most  of his wound care. He came in today for [place] to look at this. He has not had [name] history of wounds in his buttocks in spite of his paraplegia. 12/08/17; seen in follow-up today at his usual appointment. He was seen earlier this week and found to have [name] new wound on his buttock. We also follow him for wounds on the left lateral leg, left first second toe web space and right first second toe web space 12/15/17; we have been using Hydrofera Blue to the left lateral leg which has improved. The right first second toe web space has also improved. Left first second toe web space plantar aspect looks stable. The left buttock has worsened using Santyl. Apparently the buttock has drainage 12/22/17; we have been using Hydrofera Blue to the left lateral leg which continues to improve now 2 small wounds separated by normal skin. He tells [place] he had [name] fever up to 100 yesterday he is prone to UTIs but has not noted anything different. He does in and out catheterizations.  The area between the first and second toes today does not look good necrotic surface covered with what looks to be purulent drainage and erythema extending into the third toe. I had gotten this to something that I thought look better last time however it is not look good today. He also has [name] necrotic surface over the buttock wound which is expanded. I thought there might be infection under here so I removed [name] lot of the surface with [name] #5 curet though nothing look like it really needed culturing. He is been using Santyl to this area 12/27/17; his original wound on the left lateral leg continues to improve using Hydrofera Blue. I gave him samples of Baxdella although he was unable to take them out of fear for an allergic reaction ["lump in his throat"].the culture I did of the purulent drainage from his second toe last week showed both enterococcus and [name] set Enterobacter I was also concerned about the erythema on the bottom of his foot although paradoxically  although this looks somewhat better today. 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 We've been using silver alginate here. They came up today that he sleeps in [name] recliner, I'[name] not sure why but I asked him to stop this 01/03/18; his original wound we've been using Hydrofera Blue is now separated into 2 areas. ooUlcer on his left buttock is better he is off the recliner and sleeping in bed ooFinally both wound areas between his first and second toes also looks some better 01/10/18; his original wound on the left lateral leg is now separated into 2 wounds we've been using Hydrofera Blue ooUlcer on his left buttock has some drainage. There is [name] small probing site going into muscle layer superiorly.using silver alginate -He arrives today with [name] deep tissue injury on the left heel ooThe wound on the dorsal aspect of his first second toe on the left looks [name] lot betterusing silver alginate ketoconazole ooThe area on the first second toe web space on the right also looks [name] lot bette 01/17/18; his original wound on the left lateral leg continues to progress using Hydrofera Blue ooUlcer on his left buttock also is smaller surface healthier except for [name] small probing site going into the muscle layer superiorly. 2.4 cm of tunneling in this area ooDTI on his left heel we have only been offloading. Looks better than last week no threatened open no evidence of infection oothe wound on the dorsal aspect of the first second toe on the left continues to look like it's regressing we have only been using silver alginate and terbinafine orally ooThe area in the first second toe web space on the right also looks to be [name] lot better using silver alginate and terbinafine I think this was prompted by tinea pedis 01/31/18; the patient was hospitalized in [place] last week apparently for [name] complicated UTI. He was discharged on  cefepime he does in and out catheterizations. In the hospital he was discovered [name] I don't mild elevation of AST and ALT and the terbinafine was stopped.predictably the pressure ulcer on s his buttock looks betterusing silver alginate. The area on the left lateral leg also is better using Hydrofera Blue. The area between the first and second toes on the left better. First and second toes on the right still substantial but better. Finally the DTI on the left heel has held together and looks like it's  resolving 02/07/18-he is here in follow-up evaluation for multiple ulcerations. He has new injury to the lateral aspect of the last issue [name] pressure ulcer, he states this is from adhesive removal trauma. He states he has tried multiple adhesive products with no success. All other ulcers appear stable. The left heel DTI is resolving. We will continue with same treatment plan and follow-up next week. 02/14/18; follow-up for multiple areas. ooHe has [name] new area last week on the lateral aspect of his pressure ulcer more over the posterior trochanter. The original pressure ulcer looks quite stable has healthy granulation. 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 Almost fully epithelialized on the original superior area using Hydrofera Blue ooDTI on the left heel has peeled off this week to reveal [name] small superficial wound under denuded skin and subcutaneous tissue ooBoth areas between the first and second toes look better including nothing open on the left 02/21/18; ooThe patient's wounds on his left ischial tuberosity and posterior left greater trochanter actually looked better. He has [name] large area of irritation around the area which I think is contact dermatitis. I am doubtful that this is fungal ooHis original wound on the left lateral calf continues to improve we have been using Hydrofera  Blue ooThere is no open area in the left first second toe web space although there is [name] lot of thick callus ooThe DTI on the left heel required debridement today of necrotic surface eschar and subcutaneous tissue using silver alginate ooFinally the area on the right first second toe webspace continues to contract using silver alginate and ketoconazole 02/28/18 ooLeft ischial tuberosity wounds look better using silver alginate. ooOriginal wound on the left calf only has one small open area left using Hydrofera Blue ooDTI on the left heel required debridement mostly removing skin from around this wound surface. Using silver alginate ooThe areas on the right first/second toe web space using silver alginate and ketoconazole 03/08/18 on evaluation today patient appears to be doing decently well as best I can tell in regard to his wounds. This is the first time that I have seen him as he generally is followed by Dr. Dellia Nims. With that being said none of his wounds appear to be infected he does have an area where there is some skin covering what appears to be [name] new wound on the left dorsal surface of his great toe. This is right at the nail bed. With that being said I do believe that debrided away some of the excess skin can be of benefit in this regard. Otherwise he has been tolerating the dressing changes without complication. 03/14/18; patient arrives today with the multiplicity of wounds that we are following. He has not been systemically unwell ooOriginal wound on the left lateral calf now only has 2 small open areas we've been using Hydrofera Blue which should continue ooThe deep tissue injury on the left heel requires debridement today. We've been using silver alginate ooThe left first second toe and the right first second toe are both are reminiscence what I think was tinea pedis. Apparently some of the callus Surface between the toes was removed last week when it started  draining. ooPurulent drainage coming from the wound on the ischial tuberosity on the left. 03/21/18-He is here in follow-up evaluation for multiple wounds. 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 He tolerated  debridement. The only change to last week's recommendations is to discontinue antifungal cream between toes. He will follow-up next week 03/28/18; following up for multiple wounds;Concern this week is streaking redness and swelling in the right foot. He is going to need antibiotics for this. 03/31/18; follow-up for right foot cellulitis. Streaking redness and swelling in the right foot on 03/28/18. 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 I put him on clindamycin 300 mg every 6 and brought him in for [name] quick check. He has an open wound between his first and second toes on the right foot as [name] potential source. 04/04/18; ooRight foot cellulitis is resolving he is completing clindamycin. This is truly good news ooLeft lateral calf wound which is initial wound only has one small open area inferiorly this is close to healing out. He has compression stockings. We will use Hydrofera Blue right down to the epithelialization of this ooNonviable surface on the left heel which was initially pressure with [name] DTI. We've been using Hydrofera Blue. I'[name] going to switch this back to silver alginate ooLeft first second toe/tinea pedis this looks better using silver alginate ooRight first second toe tinea pedis using silver alginate ooLarge pressure ulcers on theLeft ischial tuberosity. Small wound here Looks better. I am uncertain about the surface over the large wound. Using silver alginate 04/11/18; ooCellulitis in the right foot is resolved ooLeft lateral calf wound which was his original wounds still has 2 tiny open areas remaining this is just about closed ooNonviable surface on the left heel is better  but still requires debridement ooLeft first second toe/tinea pedis still open using silver alginate ooRight first second toe wound tinea pedis I asked him to go back to using ketoconazole and silver alginate ooLarge pressure ulcers on the left ischial tuberosity this shear injury here is resolved. Wound is smaller. No evidence of infection using silver alginate 04/18/18; ooPatient arrives with an intense area of cellulitis in the right mid lower calf extending into the right heel area. Bright red and warm. Smaller area on the left anterior leg. He has [name] significant history of MRSA. He will definitely need antibioticsoodoxycycline ooHe now has 2 open areas on the left ischial tuberosity the original large wound and now [name] satellite area which I think was above his initial satellite areas. Not [name] wonderful surface on this satellite area surrounding erythema which looks like pressure related. ooHis left lateral calf wound again his original wound is just about closed ooLeft heel pressure injury still requiring debridement ooLeft first second toe looks [name] lot better using silver alginate ooRight first second toe also using silver alginate and ketoconazole cream also looks better 04/20/18; the patient was worked in early today out of concerns with his cellulitis on the right leg. I had started him on doxycycline. This was 2 days ago. His wife was concerned about the swelling in the area. Also concerned about the left buttock. He has not been systemically unwell no fever chills. No nausea vomiting or diarrhea 04/25/18; the patient's left buttock wound is continued to deteriorate he is using Hydrofera Blue. He is still completing clindamycin for the cellulitis on the right leg although all of this looks better. 05/02/18 ooLeft buttock wound still with [name] lot of drainage and [name] very tightly adherent fibrinous necrotic surface. He has [name] deeper area superiorly ooThe left lateral calf wound is still  closed ooDTI wound on the left heel necrotic surface especially the circumference using Iodoflex ooAreas between his  left first second toe and right first second toe both look better. Dorsally and the right first second toe he had [name] necrotic surface although at smaller. In using silver alginate and ketoconazole. I did [name] culture last week which was [name] deep tissue culture of the reminiscence of the open wound on the right first second toe dorsally. This grew [name] few Acinetobacter and [name] few methicillin-resistant staph aureus. Nevertheless the area actually this week looked better. I didn't feel the need to specifically address this at least in terms of systemic antibiotics. 05/09/18; wounds are measuring larger more drainage per our intake. We are using Santyl covered with alginate on the large superficial buttock wounds, Iodosorb on the left heel, ketoconazole and silver alginate to the dorsal first and second toes bilaterally. 05/16/18; ooThe area on his left buttock better in some aspects although the area superiorly over the ischial tuberosity required an extensive debridement.using Santyl ooLeft heel appears stable. Using Iodoflex ooThe areas between his first and second toes are not bad however there is spreading erythema up the dorsal aspect of his left foot this looks like cellulitis again. He is insensate the erythema is really very brilliant.o Erysipelas He went to see an allergist days ago because he was itching part of this he had lab work done. This showed [name] white count of 15.1 with 70% neutrophils. Hemoglobin of 11.4 and [name] platelet count of 659,000. Last white count we had in Epic was [name] 2-1/2 years ago which was 25.9 but he was ill at the time. He was able to show me some lab work that was done by his primary physician the pattern is about the same. 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 But prompted me to go ahead and do x-rays of both  feet and the pelvis rule out osteomyelitis. He also had [name] comprehensive metabolic panel this was reasonably normal his albumin was 3.7 liver function tests BUN/creatinine all normal 05/23/18; x-rays of both his feet from last week were negative for underlying pulmonary abnormality. The x-ray of his pelvis however showed mild irregularity in the left ischial which may represent some early osteomyelitis. The wound in the left ischial continues to get deeper clearly now exposed muscle. Each week necrotic surface material over this area. Whereas the rest of the wounds do not look so bad. ooThe left ischial wound we have been using Santyl and calcium alginate ooT the left heel surface necrotic debris using Iodoflex o ooThe left lateral leg is still healed ooAreas on the left dorsal foot and the right dorsal foot are about the same. There is some inflammation on the left which might represent contact dermatitis, fungal dermatitis I am doubtful cellulitis although this looks better than last week 05/30/18; CT scan done at Hospital did not show any osteomyelitis or abscess. Suggested the possibility of underlying cellulitis although I don't see [name] lot of evidence of this at the bedside ooThe wound itself on the left buttock/upper thigh actually looks somewhat better. No debridement ooLeft heel also looks better no debridement continue Iodoflex ooBoth dorsal first second toe spaces appear better using Lotrisone. Left still required debridement 06/06/18; ooIntake reported some purulent looking drainage from the left gluteal wound. Using Santyl and calcium alginate ooLeft heel looks better although still [name] nonviable surface requiring debridement ooThe left dorsal foot first/second webspace actually expanding and somewhat deeper. I may consider doing [name] shave biopsy of this area ooRight dorsal foot first/second  webspace appears stable to improved. Using Lotrisone and silver alginate to both these  areas 06/13/18 ooLeft gluteal surface looks better. Now separated in the 2 wounds. No debridement required. Still drainage. We'll continue silver alginate ooLeft heel continues to look better with Iodoflex continue this for at least another week ooOf his dorsal foot wounds the area on the left still has some depth although it looks better than last week. We've been using Lotrisone and silver alginate 06/20/18 ooLeft gluteal continues to look better healthy tissue ooLeft heel continues to look better healthy granulation wound is smaller. He is using Iodoflex and his long as this continues continue the Iodoflex ooDorsal right foot looks better unfortunately dorsal left foot does not. There is swelling and erythema of his forefoot. He had minor trauma to this several days ago but doesn't think this was enough to have caused any tissue injury. Foot looks like cellulitis, we have had this problem before 06/27/18 on evaluation today patient appears to be doing [name] little worse in regard to his foot ulcer. 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 Both of which would really be his only options for treating this infection. In the past he has been given and effusion of Orbactiv. This is done very well for him in the past again it's one time dosing IV antibiotic therapy. Subsequently I do believe this is something we're gonna need to see about doing at this point in time. Currently his other wounds seem to be doing somewhat better in my pinion I'[name] pretty happy in that regard. 07/03/18 on evaluation today patient's wounds actually appear to be doing fairly well. He has been tolerating the dressing changes without complication. All in all he seems to be showing signs of improvement. In regard to the antibiotics he has been dealing with infectious disease since I saw him  last week as far as getting this scheduled. In the end [facility]'s [facility] this coming Friday. In the meantime he has been continuing to perform the dressing changes in such as previous. There does not appear to be any evidence of infection worsengin at this time. 07/10/18; ooSince I last saw this man 2 [name] ago things have actually improved. IV antibiotics of resulted in less forefoot erythema although there is still some present. He is not systemically unwell ooLeft buttock wounds o2 now have no depth there is increased epithelialization Using silver alginate ooLeft heel still requires debridement using Iodoflex ooLeft dorsal foot still with [name] sizable wound about the size of [name] border but healthy granulation ooRight dorsal foot still with [name] slitlike area using silver alginate 07/18/18; the patient's cellulitis in the left foot is improved in fact I think it is on its way to resolving. ooLeft buttock wounds o2 both look better although the larger one has hypertension granulation we've been using silver alginate ooLeft heel has some thick circumferential redundant skin over the wound edge which will need to be removed today we've been using Iodoflex ooLeft dorsal foot is still [name] sizable wound required debridement using silver alginate ooThe right dorsal foot is just about closed only [name] small open area remains here 07/25/18; left foot cellulitis is resolved ooLeft buttock wounds o2 both look better. Hyper-granulation on the major area ooLeft heel as some debris over the surface  but otherwise looks [name] healthier wound. Using silver collagen ooRight dorsal foot is just about closed 07/31/18; arrives with our intake nurse worried about purulent drainage from the buttock. We had hyper-granulation here last week ooHis buttock wounds o2 continue to look better ooLeft heel some debris over the surface but measuring smaller. ooRight  dorsal foot unfortunately has openings between the toes ooLeft foot superficial wound looks less aggravated. 08/07/18 ooButtock wounds continue to look better although some of her granulation and the larger medial wound. silver alginate ooLeft heel continues to look [name] lot better.silver collagen ooLeft foot superficial wound looks less stable. Requires debridement. He has [name] new wound superficial area on the foot on the lateral dorsal foot. ooRight foot looks better using silver alginate without Lotrisone 08/14/2018; patient was in the ER last week diagnosed with [name] UTI. He is now on Cefpodoxime and Macrodantin. ooButtock wounds continued to be smaller. Using silver alginate ooLeft heel continues to look better using silver collagen ooLeft foot superficial wound looks as though it is improving ooRight dorsal foot area is just about healed. 08/21/2018; patient is completed his antibiotics for his UTI. ooHe has 2 open areas on the buttocks. There is still not closed although the surface looks satisfactory. Using silver alginate ooLeft heel continues to improve using silver collagen ooThe bilateral dorsal foot areas which are at the base of his first and second toes/possible tinea pedis are actually stable on the left but worse on the right. The area on the left required debridement of necrotic surface. After debridement I obtained [name] specimen for PCR culture. ooThe right dorsal foot which is been just about healed last week is now reopened 08/28/2018; culture done on the left dorsal foot showed coag negative staph both staph epidermidis and Lugdunensis. I think this is worthwhile initiating systemic treatment. I will use doxycycline given his long list of allergies. The area on the left heel slightly improved but still requiring debridement. ooThe large wound on the buttock is just about closed whereas the smaller one is larger. Using silver alginate in this area 09/04/2018; patient is  completing his doxycycline for the left foot although this continues to be [name] very difficult wound area with very adherent necrotic debris. We are using silver alginate to all his wounds right foot left foot and the small wounds on his buttock, silver collagen on the left heel. 09/11/2018; once again this patient has intense erythema and swelling of the left forefoot. Lesser degrees of erythema in the right foot. He has [name] long list of allergies and intolerances. I will reinstitute doxycycline. 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 Using silver alginate ooLeft heel also looks better using silver collagen ooUnfortunately both the areas on his feet look worse. The area on the left first second webspace is now gone through to the plantar part of his foot. The area on the left foot anteriorly is irritated with erythema and swelling in the forefoot. 09/25/2018 ooHis wound on the left plantar heel looks better. Using silver collagen ooThe area on the left buttock 2 small remnant areas. One is closed one is still open. Using silver alginate ooThe areas between both his first and second toes look worse. This in spite of long-standing antifungal therapy with ketoconazole and silver alginate which should have antifungal activity ooHe has small areas around his original wound on the left calf one is on the bottom of the original scar tissue and  one superiorly both of these are small and superficial but again given wound history in this site this is worrisome 10/02/2018 ooLeft plantar heel continues to gradually contract using silver collagen ooLeft buttock wound is unchanged using silver alginate ooThe areas on his dorsal feet between his first and second toes bilaterally look about the same. I prescribed clindamycin ointment to see if we can address chronic staph colonization and also the underlying possibility of erythrasma ooThe left lateral  lower extremity wound is actually on the lateral part of his ankle. Small open area here. We have been using silver alginate 10/09/2018; ooLeft plantar heel continues to look healthy and contract. No debridement is required ooLeft buttock slightly smaller with [name] tape injury wound just below which was new this week ooDorsal feet somewhat improved I have been using clindamycin ooLeft lateral looks lower extremity the actual open area looks worse although [name] lot of this is epithelialized. I am going to change to silver collagen today He has [name] lot more swelling in the right leg although this is not pitting not red and not particularly warm there is [name] lot of spasm in the right leg usually indicative of people with paralysis of some underlying discomfort. We have reviewed his vascular status from 2017 he had [name] left greater saphenous vein ablation. I wonder about referring him back to vascular surgery if the area on the left leg continues to deteriorate. 10/16/2018 in today for follow-up and management of multiple lower extremity ulcers. His left Buttock wound is much lower smaller and almost closed completely. The wound to the left ankle has began to reopen with Epithelialization and some adherent slough. He has multiple new areas to the left foot and leg. The left dorsal foot without much improvement. Wound present between left great webspace and 2nd toe. Erythema and edema present right leg. Right LE ultrasound obtained on 10/10/18 was negative for DVT . 10/23/2018; ooLeft buttock is closed over. Still dry macerated skin but there is no open wound. I suspect this is chronic pressure/moisture ooLeft lateral calf is quite [name] bit worse than when I saw this last. There is clearly drainage here he has macerated skin into the left plantar heel. We will change the primary dressing to alginate ooLeft dorsal foot has some improvement in overall wound area. Still using clindamycin and silver  alginate ooRight dorsal foot about the same as the left using clindamycin and silver alginate ooThe erythema in the right leg has resolved. He is DVT rule out was negative ooLeft heel pressure area required debridement although the wound is smaller and the surface is health 10/26/2018 ooThe patient came back in for his nurse check today predominantly because of the drainage coming out of the left lateral leg with [name] recent reopening of his original wound on the left lateral calf. He comes in today with [name] large amount of surrounding erythema around the wound extending from the calf into the ankle and even in the area on the dorsal foot. He is not systemically unwell. He is not febrile. Nevertheless this looks like cellulitis. We have been using silver alginate to the area. I changed him to [name] regular visit and I am going to prescribe him doxycycline. The rationale here is [name] long list of medication intolerances and [name] history of MRSA. I did not see anything that I thought would provide [name] valuable culture 10/30/2018 ooFollow-up from his appointment 4 days ago with really an extensive area of cellulitis in the left calf left  lateral ankle and left dorsal foot. I put him on doxycycline. He has [name] long list of medication allergies which are true allergy reactions. 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 In any case he is [name] lot better today. The erythema especially in the anterior and lateral left calf is better. He still has left ankle erythema. He also is complaining about increasing edema in the right leg we have only been using Kerlix Coban and he has been doing the wraps at home. Finally he has [name] spotty rash on the medial part of his upper left calf which looks like folliculitis or perhaps wrap occlusion type injury. Small superficial macules not pustules 11/06/18 patient arrives today with again [name] considerable degree of erythema  around the wound on the left lateral calf extending into the dorsal ankle and dorsal foot. This is [name] lot worse than when I saw this last week. He is on doxycycline really with not [name] lot of improvement. He has not been systemically unwell Wounds on the; left heel actually looks improved. Original area on the left foot and proximity to the first and second toes looks about the same. He has superficial areas on the dorsal foot, anterior calf and then the reopening of his original wound on the left lateral calf which looks about the same ooThe only area he has on the right is the dorsal webspace first and second which is smaller. ooHe has [name] large area of dry erythematous skin on the left buttock small open area here. 11/13/2018; the patient arrives in much better condition. The erythema around the wound on the left lateral calf is [name] lot better. Not sure whether this was the clindamycin or the TCA and ketoconazole or just in the improvement in edema control [stasis dermatitis]. In any case this is [name] lot better. The area on the left heel is very small and just about resolved using silver collagen we have been using silver alginate to the areas on his dorsal feet 11/20/2018; his wounds include the left lateral calf, left heel, dorsal aspects of both feet just proximal to the first second webspace. He is stable to slightly improved. I did not think any changes to his dressings were going to be necessary 11/27/2018 he has [name] reopening on the left buttock which is surrounded by what looks like tinea or perhaps some other form of dermatitis. The area on the left dorsal foot has some erythema around it I have marked this area but I am not sure whether this is cellulitis or not. Left heel is not closed. Left calf the reopening is really slightly longer and probably worse 1/13; in general things look better and smaller except for the left dorsal foot. Area on the left heel is just about closed, left buttock looks  better only [name] small wound remains in the skin looks better [using Lotrisone] 1/20; the area on the left heel only has [name] few remaining open areas here. Left lateral calf about the same in terms of size, left dorsal foot slightly larger right lateral foot still not closed. The area on the left buttock has no open wound and the surrounding skin looks [name] lot better 1/27; the area on the left heel is closed. Left lateral calf better but still requiring extensive debridements. The area on his left buttock is closed. He still has the open areas on the left dorsal foot which is slightly smaller in the  right foot which is slightly expanded. We have been using Iodoflex on these areas as well 2/3; left heel is closed. Left lateral calf still requiring debridement using Iodoflex there is no open area on his left buttock however he has dry scaly skin over [name] large area of this. Not really responding well to the Lotrisone. Finally the areas on his dorsal feet at the level of the first second webspace are slightly smaller on the right and about the same on the left. Both of these vigorously debrided with Anasept and gauze 2/10; left heel remains closed he has dry erythematous skin over the left buttock but there is no open wound here. Left lateral leg has come in and with. Still requiring debridement we have been using Iodoflex here. Finally the area on the left dorsal foot and right dorsal foot are really about the same extremely dry callused fissured areas. He does not yet have [name] dermatology appointment 2/17; left heel remains closed. He has [name] new open area on the left buttock. The area on the left lateral calf is bigger longer and still covered in necrotic debris. No major change in his foot areas bilaterally. I am awaiting for [name] dermatologist to look on this. We have been using ketoconazole I do not know that this is been doing any good at all. 2/24; left heel remains closed. The left buttock wound that was new  reopening last week looks better. The left lateral calf appears better also although still requires debridement. The major area on his foot is the left first second also requiring debridement. We have been putting Prisma on all wounds. I do not believe that the ketoconazole has done too much good for his feet. He will use Lotrisone I am going to give him [name] 2-week course of terbinafine. We still do not have [name] dermatology appointment 3/2 left heel remains closed however there is skin over bone in this area I pointed this out to him today. The left buttock wound is epithelialized but still does not look completely stable. The area on the left leg required debridement were using silver collagen here. With regards to his feet we changed to Lotrisone last week and silver alginate. 3/9; left heel remains closed. Left buttock remains closed. The area on the right foot is essentially closed. The left foot remains unchanged. Slightly smaller on the left lateral calf. Using silver collagen to both of these areas 3/16-Left heel remains closed. Area on right foot is closed. Left lateral calf above the lateral malleolus open wound requiring debridement with easy bleeding. Left dorsal wound proximal to first toe also debrided. Left ischial area open new. Patient has been using Prisma with wrapping every 3 days. Dermatology appointment is apparently tomorrow.Patient has completed his terbinafine 2-week course with some apparent improvement according to him, there is still flaking and dry skin in his foot on the left 3/23; area on the right foot is reopened. The area on the left anterior foot is about the same still [name] very necrotic adherent surface. He still has the area on the left leg and reopening is on the left buttock. He apparently saw dermatology although I do not have [name] note. According to the patient who is usually fairly well informed they did not have any good ideas. Put him on oral terbinafine which she is  been on before. 3/30; using silver collagen to all wounds. Apparently his dermatologist put him on doxycycline and rifampin presumably some culture grew staph. I do  not have this result. He remains on terbinafine although I have used terbinafine on him before 4/6; patient has had [name] fairly substantial reopening on the right foot between the first and second toes. He is finished his terbinafine and I believe is on doxycycline and rifampin still as prescribed by dermatology. We have been using silver collagen to all his wounds although the patient reports that he thinks silver alginate does better on the wounds on his buttock. 4/13; the area on his left lateral calf about the same size but it did not require debridement. ooLeft dorsal foot just proximal to the webspace between the first and second toes is about the same. Still nonviable surface. I note some superficial bronze discoloration of the dorsal part of his foot ooRight dorsal foot just proximal to the first and second toes also looks about the same. I still think there may be the same discoloration I noted above on the left ooLeft buttock wound looks about the same 4/20; left lateral calf appears to be gradually contracting using silver collagen. 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 The left dorsal foot wound is debrided of tightly adherent necrotic debris and really cleans up quite nicely. The right area is worse with expansion. I did not debride this it is now over the base of the second toe ooThe area on his left buttock is smaller no debridement is required using silver collagen 5/4; left calf continues to make good progress. ooHe arrives with erythema around the wounds on his dorsal foot which even extends to the plantar aspect. Very concerning for coexistent infection. He is finished the erythromycin I gave him for possible erythrasma this does not seem to have  helped. ooThe area on the left foot is about the same base of the dorsal toes ooIs area on the buttock looks improved on the left 5/11; left calf and left buttock continued to make good progress. Left foot is about the same to slightly improved. ooMajor problem is on the right foot. He has not had an x-ray. Deep tissue culture I did last week showed both Enterobacter and E. coli. I did not change the doxycycline I put him on empirically although neither 1 of these were plated to doxycycline. He arrives today with the erythema looking worse on both the dorsal and plantar foot. Macerated skin on the bottom of the foot. he has not been systemically unwell 5/18-Patient returns at 1 week, left calf wound appears to be making some progress, left buttock wound appears slightly worse than last time, left foot wound looks slightly better, right foot redness is marginally better. X-ray of both feet show no air or evidence of osteomyelitis. Patient is finished his Omnicef and terbinafine. He continues to have macerated skin on the bottom of the left foot as well as right 5/26; left calf wound is better, left buttock wound appears to have multiple small superficial open areas with surrounding macerated skin. X-rays that I did last time showed no evidence of osteomyelitis in either foot. He is finished cefdinir and doxycycline. I do not think that he was on terbinafine. He continues to have [name] large superficial open area on the right foot anterior dorsal and slightly between the first and second toes. I did send him to dermatology 2 months ago or so wondering about whether they would do [name] fungal scraping. I do not believe they did but did do [name] culture. We have been using silver alginate to  the toe areas, he has been using antifungals at home topically either ketoconazole or Lotrisone. We are using silver collagen on the left foot, silver alginate on the right, silver collagen on the left lateral leg and silver  alginate on the left buttock 6/1; left buttock area is healed. We have the left dorsal foot, left lateral leg and right dorsal foot. We are using silver alginate to the areas on both feet and silver collagen to the area on his left lateral calf 6/8; the left buttock apparently reopened late last week. He is not really sure how this happened. He is tolerating the terbinafine. Using silver alginate to all wounds 6/15; left buttock wound is larger than last week but still superficial. ooCame in the clinic today with [name] report of purulence from the left lateral leg I did not identify any infection ooBoth areas on his dorsal feet appear to be better. He is tolerating the terbinafine. Using silver alginate to all wounds 6/22; left buttock is about the same this week, left calf quite [name] bit better. His left foot is about the same however he comes in with erythema and warmth in the right forefoot once again. Culture that I gave him in the beginning of May showed Enterobacter and E. coli. I gave him doxycycline and things seem to improve although neither 1 of these organisms was specifically plated. 6/29; left buttock is larger and dry this week. Left lateral calf looks to me to be improved. Left dorsal foot also somewhat improved right foot completely unchanged. The erythema on the right foot is still present. He is completing the Ceftin dinner that I gave him empirically [see discussion above.) 7/6 - All wounds look to be stable and perhaps improved, the left buttock wound is slightly smaller, per patient bleeds easily, completed ceftin, the right foot redness is less, he is on terbinafine 7/13; left buttock wound about the same perhaps slightly narrower. Area on the left lateral leg continues to narrow. Left dorsal foot slightly smaller right foot about the same. We are using silver alginate on the right foot and Hydrofera Blue to the areas on the left. Unna boot on the left 2 layer compression on  the right 7/20; left buttock wound absolutely the same. Area on lateral leg continues to get better. Left dorsal foot require debridement as did the right no major change in the 7/27; left buttock wound the same size necrotic debris over the surface. The area on the lateral leg is closed once again. His left foot looks better right foot about the same although there is some involvement now of the posterior first second toe area. He is still on terbinafine which I have given him for [name] month, not certain [name] centimeter major change 06/25/19-All wounds appear to be slightly improved according to report, left buttock wound looks clean, both foot wounds have minimal to no debris the right dorsal foot has minimal slough. We are using Hydrofera Blue to the left and silver alginate to the right foot and ischial wound. 8/10-Wounds all appear to be around the same, the right forefoot distal part has some redness which was not there before, however the wound looks clean and small. Ischial wound looks about the same with no changes 8/17; his wound on the left lateral calf which was his original chronic venous insufficiency wound remains closed. Since I last saw him the areas on the left dorsal foot right dorsal foot generally appear better but require debridement. The area on  his left initial tuberosity appears somewhat larger to me perhaps hyper granulated and bleeds very easily. We have been using Hydrofera Blue to the left dorsal foot and silver alginate to everything else 8/24; left lateral calf remains closed. The areas on his dorsal feet on the webspace of the first and second toes bilaterally both look better. The area on the left buttock which is the pressure ulcer stage II slightly smaller. I change the dressing to Hydrofera Blue to all areas 8/31; left lateral calf remains closed. The area on his dorsal feet bilaterally look better. Using Hydrofera Blue. Still requiring debridement on the left foot.  No change in the left buttock pressure ulcers however 9/14; left lateral calf remains closed. Dorsal feet look quite [name] bit better than 2 [name] ago. Flaking dry skin also [name] lot better with the ammonium lactate I gave him 2 [name] ago. The area on the left buttock is improved. He states that his Roho cushion developed [name] leak and he is getting [name] new one, in the interim he is offloading this vigorously 9/21; left calf remains closed. Left heel which was [name] possible DTI looks better this week. He had macerated tissue around the left dorsal foot right foot looks satisfactory and improved left buttock wound. I changed his dressings to his feet to silver alginate bilaterally. Continuing Hydrofera Blue on the left buttock. 9/28 left calf remains closed. Left heel did not develop anything [possible DTI] dry flaking skin on the left dorsal foot. Right foot looks satisfactory. Improved left buttock wound. We are using silver alginate on his feet Hydrofera Blue on the buttock. I have asked him to go back to the Lotrisone on his feet including the wounds and surrounding areas 10/5; left calf remains closed. The areas on the left and right feet about the same. [name] lot of this is epithelialized however debris over the remaining open areas. He is using Lotrisone and silver alginate. The area on the left buttock using Hydrofera Blue 10/26. Patient has been out for 3 [name] secondary to Covid concerns. He tested negative but I think his wife tested positive. He comes in today with the left foot substantially worse, right foot about the same. Even more concerning he states that the area on his left buttock closed over but then reopened and is considerably deeper in one aspect than it was before [stage III wound] 11/2; left foot really about the same as last week. Quarter sized wound on the dorsal foot just proximal to the first second toes. Surrounding erythema with areas of denuded epithelium. This is not really much  different looking. Did not look like cellulitis this time however. ooRight foot area about the same.. We have been using silver alginate alginate on his toes ooLeft buttock still substantial irritated skin around the wound which I think looks somewhat better. We have been using Hydrofera Blue here. 11/9; left foot larger than last week and [name] very necrotic surface. Right foot I think is about the same perhaps slightly smaller. Debris around the circumference also addressed. Unfortunately on the left buttock there is been [name] decline. Satellite lesions below the major wound distally and now [name] an additional one posteriorly we have been using Hydrofera Blue but I think this is [name] pressure issue 11/16; left foot ulcer dorsally again [name] very adherent necrotic surface. Right foot is about the same. Not much change in the pressure ulcer on his left buttock. 11/30; left foot ulcer dorsally basically the same as when  I saw him 2 [name] ago. Very adherent fibrinous debris on the wound surface. Patient reports [name] lot of drainage as well. 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 We have been using Iodoflex, patient changed back to alginate because of the drainage. Area on his right dorsal foot really looks benign with [name] healthier surface certainly [name] lot better than on the left. Left buttock wounds all improved using Hydrofera Blue 12/7; left dorsal foot again no improvement. Tightly adherent debris. PCR culture I did last week only showed likely skin contaminant. I have gone ahead and done [name] punch biopsy of this which is about the last thing in terms of investigations I can think to do. He has known venous insufficiency and venous hypertension and this could be the issue here. The area on the right foot is about the same left buttock slightly worse according to our intake nurse secondary to [facility] Blue sticking to the wound 12/14; biopsy of the left foot that I did  last time showed changes that could be related to wound healing/chronic stasis dermatitis phenomenon no neoplasm. We have been using silver alginate to both feet. I change the one on the left today to Sorbact and silver alginate to his other 2 wounds 12/28; the patient arrives with the following problems; ooMajor issue is the dorsal left foot which continues to be [name] larger deeper wound area. Still with [name] completely nonviable surface ooParadoxically the area mirror image on the right on the right dorsal foot appears to be getting better. ooHe had some loss of dry denuded skin from the lower part of his original wound on the left lateral calf. Some of this area looked [name] little vulnerable and for this reason we put him in wrap that on this side this week ooThe area on his left buttock is larger. He still has the erythematous circular area which I think is [name] combination of pressure, sweat. This does not look like cellulitis or fungal dermatitis 11/26/2019; -Dorsal left foot large open wound with depth. Still debris over the surface. Using Sorbact ooThe area on the dorsal right foot paradoxically has closed over [facility] has [name] reopening on the left ankle laterally at the base of his original wound that extended up into the calf. This appears clean. ooThe left buttock wound is smaller but with very adherent necrotic debris over the surface. We have been using silver alginate here as well The patient had arterial studies done in 2017. He had biphasic waveforms at the dorsalis pedis and posterior tibial bilaterally. ABI in the left was 1.17. Digit waveforms were dampened. He has slight spasticity in the great toes I do not think [name] TBI would be possible 1/11; the patient comes in today with [name] sizable reopening between the first and second toes on the right. This is not exactly in the same location where we have been treating wounds previously. According to our intake nurse this was actually fairly deep but  0.6 cm. The area on the left dorsal foot looks about the same the surface is somewhat cleaner using Sorbact, his MRI is in 2 days. We have not managed yet to get arterial studies. The new reopening on the left lateral calf looks somewhat better using alginate. The left buttock wound is about the same using alginate 1/18; the patient had his ARTERIAL studies which were quite normal. ABI in the right at 1.13 with triphasic/biphasic waveforms on the left ABI 1.06 again with  triphasic/biphasic waveforms. It would not have been possible to have done [name] toe brachial index because of spasticity. We have been using Sorbac to the left foot alginate to the rest of his wounds on the right foot left lateral calf and left buttock 1/25; arrives in clinic with erythema and swelling of the left forefoot worse over the first MTP area. This extends laterally dorsally and but also posteriorly. Still has an area on the left lateral part of the lower part of his calf wound it is eschared and clearly not closed. ooArea on the left buttock still with surrounding irritation and erythema. ooRight foot surface wound dorsally. The area between the right and first and second toes appears better. 2/1; ooThe left foot wound is about the same. Erythema slightly better I gave him [name] week of doxycycline empirically ooRight foot wound is more extensive extending between the toes to the plantar surface ooLeft lateral calf really no open surface on the inferior part of his original wound however the entire area still looks vulnerable ooAbsolutely no improvement in the left buttock wound required debridement. 2/8; the left foot is about the same. Erythema is slightly improved I gave him clindamycin last week. ooRight foot looks better he is using Lotrimin and silver alginate ooHe has [name] breakdown in the left lateral calf. Denuded epithelium which I have removed ooLeft buttock about the same were using Hydrofera Blue 2/15; left  foot is about the same there is less surrounding erythema. Surface still has tightly adherent debris which I have debriding however not making any progress ooRight foot has [name] substantial wound on the medial right second toe between the first and second webspace. ooStill an open area on the left lateral calf distal area. ooButtock wound is about the same 2/22; left foot is about the same less surrounding erythema. Surface has adherent debris. Polymen Ag Right foot area significant wound between the first and second toes. We have been using silver alginate here Left lateral leg polymen Ag at the base of his original venous insufficiency wound ooLeft buttock some improvement here 3/1; ooRight foot is deteriorating in the first second toe webspace. Larger and more substantial. We have been using silver alginate. ooLeft dorsal foot about the same markedly adherent surface debris using PolyMem Ag ooLeft lateral calf surface debris using PolyMem AG ooLeft buttock is improved again using PolyMem Ag. ooHe is completing his terbinafine. The erythema in the foot seems better. He has been on this for 2 [name] 3/8; no improvement in any wound area in fact he has [name] small open area on the dorsal midfoot which is new this week. He has not gotten his foot x-rays yet 3/15; his x-rays were both negative for osteomyelitis of both feet. No major change in any of his wounds on the extremities however his buttock wounds are better. We have been using polymen on the buttocks, left lower leg. Iodoflex on the left foot and silver alginate on the right 3/22; arrives in clinic today with the 2 major issues are the improvement in the left dorsal foot wound which for once actually looks healthy with [name] nice healthy wound surface without debridement. Using Iodoflex here. Unfortunately on the left lateral calf which is in the distal part of his original wound he came to the clinic here for there was purulent drainage  noted some increased breakdown scattered around the original area and [name] small area proximally. We we are using polymen here will change to silver alginate today. His  buttock wound on the left is better and I think the area on the right first second toe webspace is also improved 3/29; left dorsal foot looks better. Using Iodoflex. Left ankle culture from deterioration last time grew E. coli, Enterobacter and Enterococcus. I will give him [name] course of cefdinir although that will not cover Enterococcus. The area on the right foot in the webspace of the first and second toe lateral first toe looks better. The area on his buttock is about healed Vascular appointment is on April 21. This is to look at his venous system vis--vis continued breakdown of the wounds on the left including the left lateral leg and left dorsal foot he. He has had previous ablations on this side 4/5; the area between the right first and second toes lateral aspect of the first toe looks better. Dorsal aspect of the left first toe on the left foot also improved. Unfortunately the left lateral lower leg is larger and there is [name] second satellite wound superiorly. The usual superficial abrasions on the left buttock overall better but certainly not closed 4/12; the area between the right first and second toes is improved. Dorsal aspect of the left foot also slightly smaller with [name] vibrant healthy looking surface. No real change in the left lateral leg and the left buttock wound is healed He has an unaffordable co-pay for Apligraf. Appointment with vein and vascular with regards to the left leg venous part of the circulation is on 4/21 4/19; we continue to see improvement in all wound areas. Although this is minor. He has his vascular appointment on 4/21. The area on the left buttock has not reopened although right in the center of this area the skin looks somewhat threatened 4/26; the left buttock is unfortunately reopened. In general  his left dorsal foot has [name] healthy surface and looks somewhat smaller although it was not measured as such. The area between his first and second toe webspace on the right as [name] small wound against the first toe. The patient saw vascular surgery. The real question I was asking was about the small saphenous vein on the left. He has previously ablated left greater saphenous vein. Nothing further was commented on on the left. Right greater saphenous vein without reflux at the saphenofemoral junction or proximal thigh there was no indication for ablation of the right greater saphenous vein duplex was negative for DVT bilaterally. They did not think there was anything from [name] vascular surgery point of view that could be offered. They ABIs within normal limits 5/3; only small open area on the left buttock. The area on the left lateral leg which was his original venous reflux is now 2 wounds both which look clean. We are using Iodoflex on the left dorsal foot which looks healthy and smaller. He is down to [name] very tiny area between the right first and second toes, using silver alginate 5/10; all of his wounds appear better. We have much better edema control in 4 layer compression on the left. This may be the factor that is allowing the left foot and left lateral calf to heal. He has external compression garments at home 04/14/20-All of his wounds are progressing well, the left forefoot is practically closed, left ischium appears to be about the same, right toe webspace is also smaller. The left lateral leg is about the same, continue using Hydrofera Blue to this, silver alginate to the ischium, Iodoflex to the toe space on the right 6/7; most of  his wounds outside of the left buttock are doing well. The area on the left lateral calf and left dorsal foot are smaller. The area on the right foot in between the first and second toe webspace is barely visible although he still says there is some drainage here is the  only reason I did not heal this out. ooUnfortunately the area on the left buttock almost looks like he has [name] skin tear from tape. He has open wound and then [name] large flap of skin that we are trying to get adherence over an area just next to the remaining wound 6/21; 2 week follow-up. I believe is been here for nurse visits. Miraculously the area between his first and second toes on the left dorsal foot is closed over. Still open on the right first second web space. The left lateral calf has 2 open areas. Distally this is more superficial. The proximal area had [name] little more depth and required debridement of adherent necrotic material. His buttock wound is actually larger we have been using silver alginate here 6/28; the patient's area on the left foot remains closed. Still open wet area between the first and second toes on the right and also extending into the plantar aspect. We have been using silver alginate in this location. He has 2 areas on the left lower leg part of his original long wounds which I think are better. We have been using Hydrofera Blue here. Hydrofera Blue to the left buttock which is stable 7/12; left foot remains closed. Left ankle is closed. May be [name] small area between his right first and second toes the only truly open area is on the left buttock. We have been using Hydrofera Blue here 7/19; patient arrives with marked deterioration especially in the left foot and ankle. We did not put him in [name] compression wrap on the left last week in fact he wore his juxta lite stockings on either side although he does not have an underlying stocking. He has [name] reopening on the left dorsal foot, left lateral ankle and [name] new area on the right dorsal ankle. More worrisome is the degree of erythema on the left foot extending on the lateral foot into the lateral lower leg on the left 7/26; the patient had erythema and drainage from the lateral left ankle last week. Culture of this grew MRSA  resistant to doxycycline and clindamycin which are the 2 antibiotics we usually use with this patient who has multiple antibiotic allergies including linezolid, trimethoprim sulfamethoxazole. I had give him an empiric doxycycline and he comes in the area certainly looks somewhat better although it is blotchy in his lower leg. He has not been systemically unwell. He has had areas on the left dorsal foot which is [name] reopening, chronic wounds on the left lateral ankle. Both of these I think are secondary to chronic venous insufficiency. The area between his first and second toes is closed as far as I can tell. He had [name] new wrap injury on the right dorsal ankle last week. Finally he has an area on the left buttock. We have been using silver alginate to everything except the left buttock we are using Hydrofera Blue 06/30/20-Patient returns at 1 week, has been given [name] sample dose pack of NUZYRA which is [name] tetracycline derivative [omadacycline], patient has completed those, we have been using silver alginate to almost all the wounds except the left ischium where we are using Hydrofera Blue all of them look better 8/16;  since I last saw the patient he has been doing well. The area on the left buttock, left lateral ankle and left foot are all closed today. He has completed the [place] I gave him last time and tolerated this well. He still has open areas on the right dorsal ankle and in the right first second toe area which we are using silver alginate. 8/23; we put him in his bilateral external compression stockings last week as he did not have anything open on either leg except for concerning area between the right first and second toe. He comes in today with an area on the left dorsal foot slightly more proximal than the original wound, the left lateral foot but this is actually [name] continuation of the area he had on the left lateral ankle from last time. As well he is opened up on the left buttock again. 8/30;  comes in today with things looking [name] lot better. The area on the left lower ankle has closed down as has the left foot but with eschar in both areas. The area on the dorsal right ankle is also epithelialized. Very little remaining of the left buttock wound. We have been using silver alginate on all wound areas 9/13; the area in the first second toe webspace on the right has fully epithelialized. He still has some vulnerable epithelium on the right and the ankle and the dorsal foot. He notes weeping. He is using his juxta lite stocking. On the left again the left dorsal foot is closed left lateral ankle is closed. We went to the juxta lite stocking here as well. ooStill vulnerable in the left buttock although only 2 small open areas remain here 9/27; 2-week follow-up. We did not look at his left leg but the patient says everything is closed. He is [name] bit disturbed by the amount of edema in his left foot he is using juxta lite stockings but asking about over the toes stockings which would be 30/40, will talk to him next time. According to him there is no open wound on either the left foot or the left ankle/calf He has an open area on the dorsal right calf which I initially point [name] wrap injury. He has superficial remaining wound on the left ischial tuberosity been using silver alginate although he says this sticks to the wound Objective Constitutional Sitting or standing Blood Pressure is within target range for patient.. Pulse regular and within target range for patient.Marland Kitchen Respirations regular, non-labored and within target range.. Temperature is normal and within the target range for the patient.Marland Kitchen Appears in no distress. Vitals Time Taken: 8:12 AM, Height: 70 in, Weight: 216 lbs, BMI: 31, Temperature: 98 F, Pulse: 103 bpm, Respiratory Rate: 18 breaths/min, Blood Pressure: 125/74 mmHg. General Notes: Wound exam; left buttock most of his skin looks better. The remaining wound is very superficial  little spots of epithelialization in the middle. ooOn the right dorsal ankle small punched-out area again this is superficial edema control looks fairly good Integumentary (Hair, Skin) Wound #41R status is Open. Original cause of wound was Gradually Appeared. The wound is located on the Left Ischium. The wound measures 4.5cm length x 4cm width x 0.1cm depth; 14.137cm^2 area and 1.414cm^3 volume. There is Fat Layer (Subcutaneous Tissue) exposed. There is no tunneling or undermining noted. There is [name] small amount of serosanguineous drainage noted. The wound margin is distinct with the outline attached to the wound base. There is large (67- 100%) friable granulation within the wound  bed. There is no necrotic tissue within the wound bed. Wound #46 status is Open. Original cause of wound was Gradually Appeared. The wound is located on the Right,Dorsal Ankle. The wound measures 0.4cm length x 0.2cm width x 0.1cm depth; 0.063cm^2 area and 0.006cm^3 volume. There is Fat Layer (Subcutaneous Tissue) exposed. There is no tunneling or undermining noted. There is [name] small amount of serosanguineous drainage noted. There is medium (34-66%) red granulation within the wound bed. There is [name] medium (34-66%) amount of necrotic tissue within the wound bed including Adherent Slough. 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 Dressing Change Frequency: Wound #41R Left Ischium: Change Dressing every other  day. Wound #46 Right,Dorsal Ankle: Change Dressing every other day. Skin Barriers/Peri-Wound Care: Moisturizing lotion - both legs daily Wound Cleansing: May shower and wash wound with soap and water. - on days that dressing is changed Primary Wound Dressing: Wound #41R Left Ischium: Foam Wound #46 Right,Dorsal Ankle: Calcium Alginate with Silver Secondary Dressing: Wound #41R Left Ischium: Foam Border - or ABD pad and tape Wound #46 Right,Dorsal Ankle: Foam Border Edema Control: Elevate legs to the level of the heart or above for 30 minutes daily and/or when sitting, [name] frequency of: - throughout the day Support Garment 30-40 mm/Hg pressure to: - Juxtalite to both legs daily Off-Loading: Low air-loss mattress (Group 2) Roho cushion for wheelchair Turn and reposition every 2 hours - out of wheelchair throughout the day, try to lay on sides, sleep in the bed not the recliner 1. I am just going to put foam on the left buttock rather than any specific dressing. This is very superficial I think if we protect the area it should epithelialize 2. Dorsal ankle still silver alginate and foam under his stocking. If this does not close all rewrap the leg 3. All discussed with him next time the stocking on the left leg. 30/40 stockings would be fine but tight and difficult to get on Electronic Signature(s) Signed: 08/19/2020 7:37:15 AM By: Linton Ham MD Entered By: Linton Ham on 08/18/2020 08:45:15 -------------------------------------------------------------------------------- SuperBill Details Patient Name: Date of Service: Hufnagle, [name] 08/18/2020 Medical Record Number: [id number] Patient Account Number: [id number] Date of Birth/Sex: Treating RN: [birth year] (34 y.o. 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 L97.511 Non-pressure chronic ulcer of other part of right foot limited to breakdown of skin L97.521 Non-pressure chronic ulcer of other part of left foot limited to breakdown of 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, 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, complete Facility Procedures CPT4 Code: [id number] Description: 99213 - WOUND CARE VISIT-LEV 3 EST PT Modifier: Quantity: 1 Physician Procedures : CPT4 Code Description Modifier [id number] - WC PHYS LEVEL 3 - EST PT ICD-10  Diagnosis Description I87.332 Chronic venous hypertension (idiopathic) with ulcer and inflammation of left lower extremity L97.311 Non-pressure chronic ulcer of right ankle  limited to breakdown of 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

## 2020-08-26 ENCOUNTER — Encounter (HOSPITAL_BASED_OUTPATIENT_CLINIC_OR_DEPARTMENT_OTHER): Payer: BC Managed Care – PPO | Attending: Internal Medicine | Admitting: Internal Medicine

## 2020-08-26 DIAGNOSIS — M62838 Other muscle spasm: Secondary | ICD-10-CM | POA: Diagnosis not present

## 2020-08-26 DIAGNOSIS — Z9081 Acquired absence of spleen: Secondary | ICD-10-CM | POA: Diagnosis not present

## 2020-08-26 DIAGNOSIS — L97512 Non-pressure chronic ulcer of other part of right foot with fat layer exposed: Secondary | ICD-10-CM | POA: Diagnosis not present

## 2020-08-26 DIAGNOSIS — R635 Abnormal weight gain: Secondary | ICD-10-CM | POA: Diagnosis not present

## 2020-08-26 DIAGNOSIS — I10 Essential (primary) hypertension: Secondary | ICD-10-CM | POA: Diagnosis not present

## 2020-08-26 DIAGNOSIS — L97511 Non-pressure chronic ulcer of other part of right foot limited to breakdown of skin: Secondary | ICD-10-CM | POA: Insufficient documentation

## 2020-08-26 DIAGNOSIS — L97321 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 to breakdown of skin: Secondary | ICD-10-CM | POA: Diagnosis not present

## 2020-08-26 DIAGNOSIS — Z8614 Personal history of Methicillin resistant Staphylococcus aureus infection: Secondary | ICD-10-CM | POA: Insufficient documentation

## 2020-08-26 DIAGNOSIS — L89323 Pressure ulcer of left buttock, stage 3: Secondary | ICD-10-CM | POA: Insufficient documentation

## 2020-08-26 DIAGNOSIS — G8221 Paraplegia, complete: Secondary | ICD-10-CM | POA: Insufficient documentation

## 2020-08-26 DIAGNOSIS — Z87891 Personal history of nicotine dependence: Secondary | ICD-10-CM | POA: Diagnosis not present

## 2020-08-26 DIAGNOSIS — L97311 Non-pressure chronic ulcer of right ankle limited to breakdown of skin: Secondary | ICD-10-CM | POA: Insufficient documentation

## 2020-08-26 DIAGNOSIS — Z89422 Acquired absence of other left toe(s): Secondary | ICD-10-CM | POA: Diagnosis not present

## 2020-08-26 DIAGNOSIS — I87332 Chronic venous hypertension (idiopathic) with ulcer and inflammation of left lower extremity: Secondary | ICD-10-CM | POA: Insufficient documentation

## 2020-08-26 DIAGNOSIS — R350 Frequency of micturition: Secondary | ICD-10-CM | POA: Diagnosis not present

## 2020-08-26 DIAGNOSIS — L97521 Non-pressure chronic ulcer of other part of left foot limited to breakdown of skin: Secondary | ICD-10-CM | POA: Diagnosis not present

## 2020-08-26 NOTE — Progress Notes (Signed)
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 keep the area moist. This is been refractory to multiple dressing attempts. He also has areas in his first and second toes bilaterally in the anterior and posterior web space. I had been using Iodoflex here after a prolonged course of silver alginate with ketoconazole was ineffective [question tinea pedis] 07/14/17; patient arrives today with a very difficult adherent material over his left lateral lower leg wound. He also has surrounding erythema and poorly controlled edema. He was switched his Santyl last visit which the nurses are applying once during his doctor visit and once on a nurse visit. He was also reduced to 2 layer compression I'm not exactly sure of the issue here. 07/21/17; better surface today after 1 week of Iodoflex. Significant cellulitis that we treated last week also better. [Doxycycline] 07/28/17 better surface today with now 2 weeks of Iodoflex. Significant cellulitis treated with doxycycline.  He has now completed the doxycycline and he is back to his usual degree of chronic venous inflammation/stasis dermatitis. He reminds me he has had ablations surgery here 08/04/17; continued improvement with Iodoflex to the left lateral leg wound in terms of the surface of the  wound although the dimensions are better. 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 Problematic areas on the plantar aspect of the first second toe web space on the left and the dorsal aspect of the first second toe web space on the right. At one point I felt these were probably related to chronic fungal infections in treated him aggressively for this although we have not made any improvement here. 08/11/17; left lateral leg. Surface continues to improve with the Iodoflex although we are not seeing much improvement in overall wound dimensions. Areas on his plantar left foot and right foot show no improvement. In fact the right foot looks somewhat worse 08/18/17; left lateral leg. We changed to [facility] last week after a prolonged course of Iodoflex which helps get the surface better. It appears that the wound with is improved. Continue with difficult areas on the left dorsal first second and plantar first second on the right 09/01/17; patient arrives in clinic today having had a temperature of 103 yesterday. He was seen in the ER and [facility]. The patient was concerned he could have cellulitis again in the right leg however they diagnosed him with a UTI and he is now on Keflex. He has a history of cellulitis which is been recurrent and difficult but this is been in the left leg, in the past 5 use doxycycline. He does in and out catheterizations at home which are risk factors for UTI 09/08/17; patient will be completing his Keflex this weekend. The erythema on the left leg is considerably better. He has a new wound today on the medial part of the right leg small superficial almost looks like a skin tear. He has worsening of the area on the right dorsal first and second toe. His major area on the left lateral leg is better. Using Hydrofera Blue on all areas 09/15/17; gradual reduction in width on the long wound in the left  lateral leg. No debridement required. He also has wounds on the plantar aspect of his left first second toe web space and on the dorsal aspect of the right first second toe web space. 09/22/17; there continues to be very gradual improvements in the dimensions of the left lateral leg wound. He hasn't round erythematous spot with might be pressure on his wheelchair. There is no evidence obviously of infection no purulence no warmth He has a dry scaled area on the plantar aspect of the left first second toe Improved area on the dorsal right first second toe. 09/29/17; left lateral leg wound continues to improve in dimensions mostly with an is still a fairly long but increasingly narrow wound. He has a dry scaled area on the plantar aspect of his left first second toe web space Increasingly concerning area on the dorsal right first second toe. In fact I am concerned today about possible cellulitis around this wound. The areas extending up his second toe and although there is deformities here almost appears to abut on the nailbed. 10/06/17; left lateral leg wound continues to make very gradual progress. Tissue culture I did from the right first second toe dorsal foot last time grew MRSA and enterococcus which was vancomycin  sensitive. This was not sensitive to clindamycin or doxycycline. He is allergic to Zyvox and sulfa we have therefore arrange for him to have dalvance infusion tomorrow. He is had this in the past and tolerated it well 10/20/17; left lateral leg wound continues to make decent progress. This is certainly reduced in terms of with there is advancing epithelialization.The cellulitis in the right foot looks better although he still has a deep wound in the dorsal aspect of the first second toe web space. Plantar left first toe web space on the left I think is making some progress 10/27/17; left lateral leg wound continues to make decent progress. Advancing epithelialization.using Hydrofera  Blue The right first second toe web space wound is better-looking using silver alginate Improvement in the left plantar first second toe web space. Again using silver alginate 11/03/17 left lateral leg wound continues to make decent progress albeit slowly. Using [facility] The right per second toe web space continues to be a very problematic looking punched out wound. I obtained a piece of tissue for deep culture I did extensively treated this for fungus. It is difficult to imagine that this is a pressure area as the patient states other than going outside he doesn't really wear shoes at home The left plantar first second toe web space looked fairly senescent. Necrotic edges. This required debridement change to [facility] Blue to all wound areas 11/10/17; left lateral leg wound continues to contract. Using Hydrofera Blue On the right dorsal first second toe web space dorsally. Culture I did of this area last week grew MRSA there is not an easy oral option in this patient was multiple antibiotic allergies or intolerances. This was only a rare culture isolate I'm therefore going to use Bactroban under silver alginate On the left plantar first second toe web space. Debridement is required here. This is also unchanged 11/17/17; left lateral leg wound continues to contract using Hydrofera Blue this is no longer the major issue. The major concern here is the right first second toe web space. He now has an open area going from dorsally to the plantar aspect. There is now wound on the inner lateral part of the first toe. Not a very viable surface on this. There is erythema spreading medially into the forefoot. No major change in the left first second toe plantar wound 11/24/17; left lateral leg wound continues to contract using Hydrofera Blue. Nice improvement today The right first second toe web space all of this looks a lot less angry than last week. I have given him clindamycin and topical Bactroban  for MRSA and terbinafine for the possibility of underlining tinea pedis that I could not control with ketoconazole. Looks somewhat better The area on the plantar left first second toe web space is weeping with dried debris around the wound 12/01/17; left lateral leg wound continues to contract he Hydrofera Blue. It is becoming thinner in terms of with nevertheless it is making good improvement. The right first second toe web space looks less angry but still a large necrotic-looking wounds starting on the plantar aspect of the right foot extending between the toes and now extensively on the base of the right second toe. I gave him clindamycin and topical Bactroban for MRSA anterior benefiting for the possibility of underlying tinea pedis. Not looking better today The area on the left first/second toe looks better. Debrided of necrotic debris 12/05/17* the patient was worked in urgently today because over the weekend he found blood on his  incontinence bad when he woke up. He was found to have an ulcer by his wife who does most of his wound care. He came in today for [place] to look at this. He has not had a history of wounds in his buttocks in spite of his paraplegia. 12/08/17; seen in follow-up today at his usual appointment. He was seen earlier this week and found to have a new wound on his buttock. We also follow him for wounds on the left lateral leg, left first second toe web space and right first second toe web space 12/15/17; we have been using Hydrofera Blue to the left lateral leg which has improved. The right first second toe web space has also improved. Left first second toe web space plantar aspect looks stable. The left buttock has worsened using Santyl. Apparently the buttock has drainage 12/22/17; we have been using Hydrofera Blue to the left lateral leg which continues to improve now 2 small wounds separated by normal skin. He tells [place] he had a fever up to 100 yesterday he is prone to UTIs but  has not noted anything different. He does in and out catheterizations. The area between the first and second toes today does not look good necrotic surface covered with what looks to be purulent drainage and erythema extending into the third toe. I had gotten this to something that I thought look better last time however it is not look good today. He also has a necrotic surface over the buttock wound which is expanded. I thought there might be infection under here so I removed a lot of the surface with a #5 curet though nothing look like it really needed culturing. He is been using Santyl to this area 12/27/17; his original wound on the left lateral leg continues to improve using Hydrofera Blue. I gave him samples of Baxdella although he was unable to take them out of fear for an allergic reaction ["lump in his throat"].the culture I did of the purulent drainage from his second toe last week showed both enterococcus and a set Enterobacter I was also concerned about the erythema on the bottom of his foot although paradoxically although this looks somewhat better today. 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 We've been using silver alginate here. They came up today that he sleeps in a recliner, I'm not sure why but I asked him to stop this 01/03/18; his original wound we've been using Hydrofera Blue is now separated into 2 areas. Ulcer on his left buttock is better he is off the recliner and sleeping in bed Finally both wound areas between his first and second toes also looks some better 01/10/18; his original wound on the left lateral leg is now separated into 2 wounds we've been using Hydrofera Blue Ulcer on his left buttock has some drainage. There is a small probing site going into muscle layer superiorly.using silver alginate -He arrives today with a deep tissue injury on the left heel The wound on the  dorsal aspect of his first second toe on the left looks a lot betterusing silver alginate ketoconazole The area on the first second toe web space on the right also looks a lot bette 01/17/18; his original wound on the left lateral leg continues to progress using Hydrofera Blue Ulcer on his left buttock also is smaller surface healthier except for a small probing site going into the muscle layer superiorly.  2.4 cm of tunneling in this area DTI on his left heel we have only been offloading. Looks better than last week no threatened open no evidence of infection the wound on the dorsal aspect of the first second toe on the left continues to look like it's regressing we have only been using silver alginate and terbinafine orally The area in the first second toe web space on the right also looks to be a lot better using silver alginate and terbinafine I think this was prompted by tinea pedis 01/31/18; the patient was hospitalized in [place] last week apparently for a complicated UTI. He was discharged on cefepime he does in and out catheterizations. In the hospital he was discovered M I don't mild elevation of AST and ALT and the terbinafine was stopped.predictably the pressure ulcer on s his buttock looks betterusing silver alginate. The area on the left lateral leg also is better using Hydrofera Blue. The area between the first and second toes on the left better. First and second toes on the right still substantial but better. Finally the DTI on the left heel has held together and looks like it's resolving 02/07/18-he is here in follow-up evaluation for multiple ulcerations. He has new injury to the lateral aspect of the last issue a pressure ulcer, he states this is from adhesive removal trauma. He states he has tried multiple adhesive products with no success. All other ulcers appear stable. The left heel DTI is resolving. We will continue with same treatment plan and follow-up next week. 02/14/18;  follow-up for multiple areas. He has a new area last week on the lateral aspect of his pressure ulcer more over the posterior trochanter. The original pressure ulcer looks quite stable has healthy granulation. 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 Almost fully epithelialized on the original superior area using Hydrofera Blue DTI on the left heel has peeled off this week to reveal a small superficial wound under denuded skin and subcutaneous tissue Both areas between the first and second toes look better including nothing open on the left 02/21/18; The patient's wounds on his left ischial tuberosity and posterior left greater trochanter actually looked better. He has a large area of irritation around the area which I think is contact dermatitis. I am doubtful that this is fungal His original wound on the left lateral calf continues to improve we have been using Hydrofera Blue There is no open area in the left first second toe web space although there is a lot of thick callus The DTI on the left heel required debridement today of necrotic surface eschar and subcutaneous tissue using silver alginate Finally the area on the right first second toe webspace continues to contract using silver alginate and ketoconazole 02/28/18 Left ischial tuberosity wounds look better using silver alginate. Original wound on the left calf only has one small open area left using Hydrofera Blue DTI on the left heel required debridement mostly removing skin from around this wound surface. Using silver alginate The areas on the right first/second toe web space using silver alginate and ketoconazole 03/08/18 on evaluation today patient appears to be doing decently well as best I can tell in regard to his wounds. This is the first time that I have seen him as he generally is followed by Dr. Dellia Nims. With that being said  none of his wounds appear to be infected he does have an  area where there is some skin covering what appears to be a new wound on the left dorsal surface of his great toe. This is right at the nail bed. With that being said I do believe that debrided away some of the excess skin can be of benefit in this regard. Otherwise he has been tolerating the dressing changes without complication. 03/14/18; patient arrives today with the multiplicity of wounds that we are following. He has not been systemically unwell Original wound on the left lateral calf now only has 2 small open areas we've been using Hydrofera Blue which should continue The deep tissue injury on the left heel requires debridement today. We've been using silver alginate The left first second toe and the right first second toe are both are reminiscence what I think was tinea pedis. Apparently some of the callus Surface between the toes was removed last week when it started draining. Purulent drainage coming from the wound on the ischial tuberosity on the left. 03/21/18-He is here in follow-up evaluation for multiple wounds. 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 He tolerated debridement. The only change to last week's recommendations is to discontinue antifungal cream between toes. He will follow-up next week 03/28/18; following up for multiple wounds;Concern this week is streaking redness and swelling in the right foot. He is going to need antibiotics for this. 03/31/18; follow-up for right foot cellulitis. Streaking redness and swelling in the right foot on 03/28/18. 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 I put him on clindamycin 300 mg every 6 and brought him in for a quick check. He has an open wound between his first and second toes on the right foot as a potential source. 04/04/18; Right foot cellulitis is resolving he is completing  clindamycin. This is truly good news Left lateral calf wound which is initial wound only has one small open area inferiorly this is close to healing out. He has compression stockings. We will use Hydrofera Blue right down to the epithelialization of this Nonviable surface on the left heel which was initially pressure with a DTI. We've been using Hydrofera Blue. I'm going to switch this back to silver alginate Left first second toe/tinea pedis this looks better using silver alginate Right first second toe tinea pedis using silver alginate Large pressure ulcers on theLeft ischial tuberosity. Small wound here Looks better. I am uncertain about the surface over the large wound. Using silver alginate 04/11/18; Cellulitis in the right foot is resolved Left lateral calf wound which was his original wounds still has 2 tiny open areas remaining this is just about closed Nonviable surface on the left heel is better but still requires debridement Left first second toe/tinea pedis still open using silver alginate Right first second toe wound tinea pedis I asked him to go back to using ketoconazole and silver alginate Large pressure ulcers on the left ischial tuberosity this shear injury here is resolved. Wound is smaller. No evidence of infection using silver alginate 04/18/18; Patient arrives with an intense area of cellulitis in the right mid lower calf extending into the right heel area. Bright red and warm. Smaller area on the left anterior leg. He has a significant history of MRSA. He will definitely need antibioticsdoxycycline He now has 2 open areas on the left ischial tuberosity the original large wound and now a satellite area which I think was above his initial satellite areas. Not a wonderful surface  on this satellite area surrounding erythema which looks like pressure related. His left lateral calf wound again his original wound is just about closed Left heel pressure injury still requiring  debridement Left first second toe looks a lot better using silver alginate Right first second toe also using silver alginate and ketoconazole cream also looks better 04/20/18; the patient was worked in early today out of concerns with his cellulitis on the right leg. I had started him on doxycycline. This was 2 days ago. His wife was concerned about the swelling in the area. Also concerned about the left buttock. He has not been systemically unwell no fever chills. No nausea vomiting or diarrhea 04/25/18; the patient's left buttock wound is continued to deteriorate he is using Hydrofera Blue. He is still completing clindamycin for the cellulitis on the right leg although all of this looks better. 05/02/18 Left buttock wound still with a lot of drainage and a very tightly adherent fibrinous necrotic surface. He has a deeper area superiorly The left lateral calf wound is still closed DTI wound on the left heel necrotic surface especially the circumference using Iodoflex Areas between his left first second toe and right first second toe both look better. Dorsally and the right first second toe he had a necrotic surface although at smaller. In using silver alginate and ketoconazole. I did a culture last week which was a deep tissue culture of the reminiscence of the open wound on the right first second toe dorsally. This grew a few Acinetobacter and a few methicillin-resistant staph aureus. Nevertheless the area actually this week looked better. I didn't feel the need to specifically address this at least in terms of systemic antibiotics. 05/09/18; wounds are measuring larger more drainage per our intake. We are using Santyl covered with alginate on the large superficial buttock wounds, Iodosorb on the left heel, ketoconazole and silver alginate to the dorsal first and second toes bilaterally. 05/16/18; The area on his left buttock better in some aspects although the area superiorly over the ischial  tuberosity required an extensive debridement.using Santyl Left heel appears stable. Using Iodoflex The areas between his first and second toes are not bad however there is spreading erythema up the dorsal aspect of his left foot this looks like cellulitis again. He is insensate the erythema is really very brilliant.o Erysipelas He went to see an allergist days ago because he was itching part of this he had lab work done. This showed a white count of 15.1 with 70% neutrophils. Hemoglobin of 11.4 and a platelet count of 659,000. Last white count we had in Epic was a 2-1/2 years ago which was 25.9 but he was ill at the time. He was able to show me some lab work that was done by his primary physician the pattern is about the same. 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 But prompted me to go ahead and do x-rays of both feet and the pelvis rule out osteomyelitis. He also had a comprehensive metabolic panel this was reasonably normal his albumin was 3.7 liver function tests BUN/creatinine all normal 05/23/18; x-rays of both his feet from last week were negative for underlying pulmonary abnormality. The x-ray of his pelvis however showed mild irregularity in the left ischial which may represent some early osteomyelitis. The wound in the left ischial continues to get deeper clearly now exposed muscle. Each week necrotic surface material over this area. Whereas the rest of the  wounds do not look so bad. The left ischial wound we have been using Santyl and calcium alginate T the left heel surface necrotic debris using Iodoflex o The left lateral leg is still healed Areas on the left dorsal foot and the right dorsal foot are about the same. There is some inflammation on the left which might represent contact dermatitis, fungal dermatitis I am doubtful cellulitis although this looks better than last week 05/30/18; CT scan done at Hospital did not show any  osteomyelitis or abscess. Suggested the possibility of underlying cellulitis although I don't see a lot of evidence of this at the bedside The wound itself on the left buttock/upper thigh actually looks somewhat better. No debridement Left heel also looks better no debridement continue Iodoflex Both dorsal first second toe spaces appear better using Lotrisone. Left still required debridement 06/06/18; Intake reported some purulent looking drainage from the left gluteal wound. Using Santyl and calcium alginate Left heel looks better although still a nonviable surface requiring debridement The left dorsal foot first/second webspace actually expanding and somewhat deeper. I may consider doing a shave biopsy of this area Right dorsal foot first/second webspace appears stable to improved. Using Lotrisone and silver alginate to both these areas 06/13/18 Left gluteal surface looks better. Now separated in the 2 wounds. No debridement required. Still drainage. We'll continue silver alginate Left heel continues to look better with Iodoflex continue this for at least another week Of his dorsal foot wounds the area on the left still has some depth although it looks better than last week. We've been using Lotrisone and silver alginate 06/20/18 Left gluteal continues to look better healthy tissue Left heel continues to look better healthy granulation wound is smaller. He is using Iodoflex and his long as this continues continue the Iodoflex Dorsal right foot looks better unfortunately dorsal left foot does not. There is swelling and erythema of his forefoot. He had minor trauma to this several days ago but doesn't think this was enough to have caused any tissue injury. Foot looks like cellulitis, we have had this problem before 06/27/18 on evaluation today patient appears to be doing a little worse in regard to his foot ulcer. 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 Both of which would really be his only options for treating this infection. In the past he has been given and effusion of Orbactiv. This is done very well for him in the past again it's one time dosing IV antibiotic therapy. Subsequently I do believe this is something we're gonna need to see about doing at this point in time. Currently his other wounds seem to be doing somewhat better in my pinion I'm pretty happy in that regard. 07/03/18 on evaluation today patient's wounds actually appear to be doing fairly well. He has been tolerating the dressing changes without complication. All in all he seems to be showing signs of improvement. In regard to the antibiotics he has been dealing with infectious disease since I saw him last week as far as getting this scheduled. In the end [facility]'s [facility] this coming Friday. In the meantime he has been continuing to perform the dressing changes in such as previous. There does not appear to be any evidence of infection worsengin at this time. 07/10/18; Since I  last saw this man 2 weeks ago things have actually improved. IV antibiotics of resulted in less forefoot erythema although there is still some present. He is not systemically unwell Left buttock wounds 2 now have no depth there is increased epithelialization Using silver alginate Left heel still requires debridement using Iodoflex Left dorsal foot still with a sizable wound about the size of a border but healthy granulation Right dorsal foot still with a slitlike area using silver alginate 07/18/18; the patient's cellulitis in the left foot is improved in fact I think it is on its way to resolving. Left buttock wounds 2 both look better although the larger one has hypertension granulation we've been using silver alginate Left heel has some thick  circumferential redundant skin over the wound edge which will need to be removed today we've been using Iodoflex Left dorsal foot is still a sizable wound required debridement using silver alginate The right dorsal foot is just about closed only a small open area remains here 07/25/18; left foot cellulitis is resolved Left buttock wounds 2 both look better. Hyper-granulation on the major area Left heel as some debris over the surface but otherwise looks a healthier wound. Using silver collagen Right dorsal foot is just about closed 07/31/18; arrives with our intake nurse worried about purulent drainage from the buttock. We had hyper-granulation here last week His buttock wounds 2 continue to look better Left heel some debris over the surface but measuring smaller. Right dorsal foot unfortunately has openings between the toes Left foot superficial wound looks less aggravated. 08/07/18 Buttock wounds continue to look better although some of her granulation and the larger medial wound. silver alginate Left heel continues to look a lot better.silver collagen Left foot superficial wound looks less stable. Requires debridement. He has a new wound superficial area on the foot on the lateral dorsal foot. Right foot looks better using silver alginate without Lotrisone 08/14/2018; patient was in the ER last week diagnosed with a UTI. He is now on Cefpodoxime and Macrodantin. Buttock wounds continued to be smaller. Using silver alginate Left heel continues to look better using silver collagen Left foot superficial wound looks as though it is improving Right dorsal foot area is just about healed. 08/21/2018; patient is completed his antibiotics for his UTI. He has 2 open areas on the buttocks. There is still not closed although the surface looks satisfactory. Using silver alginate Left heel continues to improve using silver collagen The bilateral dorsal foot areas which are at the base of his first and second  toes/possible tinea pedis are actually stable on the left but worse on the right. The area on the left required debridement of necrotic surface. After debridement I obtained a specimen for PCR culture. The right dorsal foot which is been just about healed last week is now reopened 08/28/2018; culture done on the left dorsal foot showed coag negative staph both staph epidermidis and Lugdunensis. I think this is worthwhile initiating systemic treatment. I will use doxycycline given his long list of allergies. The area on the left heel slightly improved but still requiring debridement. The large wound on the buttock is just about closed whereas the smaller one is larger. Using silver alginate in this area 09/04/2018; patient is completing his doxycycline for the left foot although this continues to be a very difficult wound area with very adherent necrotic debris. We are using silver alginate to all his wounds right foot left foot and the small wounds on his buttock, silver  collagen on the left heel. 09/11/2018; once again this patient has intense erythema and swelling of the left forefoot. Lesser degrees of erythema in the right foot. He has a long list of allergies and intolerances. I will reinstitute doxycycline. 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 Using silver alginate Left heel also looks better using silver collagen Unfortunately both the areas on his feet look worse. The area on the left first second webspace is now gone through to the plantar part of his foot. The area on the left foot anteriorly is irritated with erythema and swelling in the forefoot. 09/25/2018 His wound on the left plantar heel looks better. Using silver collagen The area on the left buttock 2 small remnant areas. One is closed one is still open. Using silver alginate The areas between both his first and second toes look worse. This in spite of long-standing antifungal  therapy with ketoconazole and silver alginate which should have antifungal activity He has small areas around his original wound on the left calf one is on the bottom of the original scar tissue and one superiorly both of these are small and superficial but again given wound history in this site this is worrisome 10/02/2018 Left plantar heel continues to gradually contract using silver collagen Left buttock wound is unchanged using silver alginate The areas on his dorsal feet between his first and second toes bilaterally look about the same. I prescribed clindamycin ointment to see if we can address chronic staph colonization and also the underlying possibility of erythrasma The left lateral lower extremity wound is actually on the lateral part of his ankle. Small open area here. We have been using silver alginate 10/09/2018; Left plantar heel continues to look healthy and contract. No debridement is required Left buttock slightly smaller with a tape injury wound just below which was new this week Dorsal feet somewhat improved I have been using clindamycin Left lateral looks lower extremity the actual open area looks worse although a lot of this is epithelialized. I am going to change to silver collagen today He has a lot more swelling in the right leg although this is not pitting not red and not particularly warm there is a lot of spasm in the right leg usually indicative of people with paralysis of some underlying discomfort. We have reviewed his vascular status from 2017 he had a left greater saphenous vein ablation. I wonder about referring him back to vascular surgery if the area on the left leg continues to deteriorate. 10/16/2018 in today for follow-up and management of multiple lower extremity ulcers. His left Buttock wound is much lower smaller and almost closed completely. The wound to the left ankle has began to reopen with Epithelialization and some adherent slough. He has multiple  new areas to the left foot and leg. The left dorsal foot without much improvement. Wound present between left great webspace and 2nd toe. Erythema and edema present right leg. Right LE ultrasound obtained on 10/10/18 was negative for DVT . 10/23/2018; Left buttock is closed over. Still dry macerated skin but there is no open wound. I suspect this is chronic pressure/moisture Left lateral calf is quite a bit worse than when I saw this last. There is clearly drainage here he has macerated skin into the left plantar heel. We will change the primary dressing to alginate Left dorsal foot has some improvement in overall wound area. Still using clindamycin and silver alginate  Right dorsal foot about the same as the left using clindamycin and silver alginate The erythema in the right leg has resolved. He is DVT rule out was negative Left heel pressure area required debridement although the wound is smaller and the surface is health 10/26/2018 The patient came back in for his nurse check today predominantly because of the drainage coming out of the left lateral leg with a recent reopening of his original wound on the left lateral calf. He comes in today with a large amount of surrounding erythema around the wound extending from the calf into the ankle and even in the area on the dorsal foot. He is not systemically unwell. He is not febrile. Nevertheless this looks like cellulitis. We have been using silver alginate to the area. I changed him to a regular visit and I am going to prescribe him doxycycline. The rationale here is a long list of medication intolerances and a history of MRSA. I did not see anything that I thought would provide a valuable culture 10/30/2018 Follow-up from his appointment 4 days ago with really an extensive area of cellulitis in the left calf left lateral ankle and left dorsal foot. I put him on doxycycline. He has a long list of medication allergies which are true allergy reactions.  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 In any case he is a lot better today. The erythema especially in the anterior and lateral left calf is better. He still has left ankle erythema. He also is complaining about increasing edema in the right leg we have only been using Kerlix Coban and he has been doing the wraps at home. Finally he has a spotty rash on the medial part of his upper left calf which looks like folliculitis or perhaps wrap occlusion type injury. Small superficial macules not pustules 11/06/18 patient arrives today with again a considerable degree of erythema around the wound on the left lateral calf extending into the dorsal ankle and dorsal foot. This is a lot worse than when I saw this last week. He is on doxycycline really with not a lot of improvement. He has not been systemically unwell Wounds on the; left heel actually looks improved. Original area on the left foot and proximity to the first and second toes looks about the same. He has superficial areas on the dorsal foot, anterior calf and then the reopening of his original wound on the left lateral calf which looks about the same The only area he has on the right is the dorsal webspace first and second which is smaller. He has a large area of dry erythematous skin on the left buttock small open area here. 11/13/2018; the patient arrives in much better condition. The erythema around the wound on the left lateral calf is a lot better. Not sure whether this was the clindamycin or the TCA and ketoconazole or just in the improvement in edema control [stasis dermatitis]. In any case this is a lot better. The area on the left heel is very small and just about resolved using silver collagen we have been using silver alginate to the areas on his dorsal feet 11/20/2018; his wounds include the left lateral calf, left heel, dorsal aspects of both feet just proximal to  the first second webspace. He is stable to slightly improved. I did not think any changes to his dressings were going to be necessary 11/27/2018 he has a reopening on the  left buttock which is surrounded by what looks like tinea or perhaps some other form of dermatitis. The area on the left dorsal foot has some erythema around it I have marked this area but I am not sure whether this is cellulitis or not. Left heel is not closed. Left calf the reopening is really slightly longer and probably worse 1/13; in general things look better and smaller except for the left dorsal foot. Area on the left heel is just about closed, left buttock looks better only a small wound remains in the skin looks better [using Lotrisone] 1/20; the area on the left heel only has a few remaining open areas here. Left lateral calf about the same in terms of size, left dorsal foot slightly larger right lateral foot still not closed. The area on the left buttock has no open wound and the surrounding skin looks a lot better 1/27; the area on the left heel is closed. Left lateral calf better but still requiring extensive debridements. The area on his left buttock is closed. He still has the open areas on the left dorsal foot which is slightly smaller in the right foot which is slightly expanded. We have been using Iodoflex on these areas as well 2/3; left heel is closed. Left lateral calf still requiring debridement using Iodoflex there is no open area on his left buttock however he has dry scaly skin over a large area of this. Not really responding well to the Lotrisone. Finally the areas on his dorsal feet at the level of the first second webspace are slightly smaller on the right and about the same on the left. Both of these vigorously debrided with Anasept and gauze 2/10; left heel remains closed he has dry erythematous skin over the left buttock but there is no open wound here. Left lateral leg has come in and with.  Still requiring debridement we have been using Iodoflex here. Finally the area on the left dorsal foot and right dorsal foot are really about the same extremely dry callused fissured areas. He does not yet have a dermatology appointment 2/17; left heel remains closed. He has a new open area on the left buttock. The area on the left lateral calf is bigger longer and still covered in necrotic debris. No major change in his foot areas bilaterally. I am awaiting for a dermatologist to look on this. We have been using ketoconazole I do not know that this is been doing any good at all. 2/24; left heel remains closed. The left buttock wound that was new reopening last week looks better. The left lateral calf appears better also although still requires debridement. The major area on his foot is the left first second also requiring debridement. We have been putting Prisma on all wounds. I do not believe that the ketoconazole has done too much good for his feet. He will use Lotrisone I am going to give him a 2-week course of terbinafine. We still do not have a dermatology appointment 3/2 left heel remains closed however there is skin over bone in this area I pointed this out to him today. The left buttock wound is epithelialized but still does not look completely stable. The area on the left leg required debridement were using silver collagen here. With regards to his feet we changed to Lotrisone last week and silver alginate. 3/9; left heel remains closed. Left buttock remains closed. The area on the right foot is essentially closed. The left foot remains unchanged. Slightly  smaller on the left lateral calf. Using silver collagen to both of these areas 3/16-Left heel remains closed. Area on right foot is closed. Left lateral calf above the lateral malleolus open wound requiring debridement with easy bleeding. Left dorsal wound proximal to first toe also debrided. Left ischial area open new. Patient has been  using Prisma with wrapping every 3 days. Dermatology appointment is apparently tomorrow.Patient has completed his terbinafine 2-week course with some apparent improvement according to him, there is still flaking and dry skin in his foot on the left 3/23; area on the right foot is reopened. The area on the left anterior foot is about the same still a very necrotic adherent surface. He still has the area on the left leg and reopening is on the left buttock. He apparently saw dermatology although I do not have a note. According to the patient who is usually fairly well informed they did not have any good ideas. Put him on oral terbinafine which she is been on before. 3/30; using silver collagen to all wounds. Apparently his dermatologist put him on doxycycline and rifampin presumably some culture grew staph. I do not have this result. He remains on terbinafine although I have used terbinafine on him before 4/6; patient has had a fairly substantial reopening on the right foot between the first and second toes. He is finished his terbinafine and I believe is on doxycycline and rifampin still as prescribed by dermatology. We have been using silver collagen to all his wounds although the patient reports that he thinks silver alginate does better on the wounds on his buttock. 4/13; the area on his left lateral calf about the same size but it did not require debridement. Left dorsal foot just proximal to the webspace between the first and second toes is about the same. Still nonviable surface. I note some superficial bronze discoloration of the dorsal part of his foot Right dorsal foot just proximal to the first and second toes also looks about the same. I still think there may be the same discoloration I noted above on the left Left buttock wound looks about the same 4/20; left lateral calf appears to be gradually contracting using silver collagen. He remains on erythromycin empiric treatment for possible  erythrasma involving his digital spaces. The left dorsal foot wound is debrided of tightly adherent necrotic debris and really cleans up quite nicely. The right area is worse with expansion. I did not debride this it is now over the base of the second toe The area on his left buttock is smaller no debridement is required using silver collagen 5/4; left calf continues to make good progress. He arrives with erythema around the wounds on his dorsal foot which even extends to the plantar aspect. Very concerning for coexistent infection. He is finished the erythromycin I gave him for possible erythrasma this does not seem to have helped. The area on the left foot is about the same base of the dorsal toes Is area on the buttock looks improved on the left 5/11; left calf and left buttock continued to make good progress. Left foot is about the same to slightly improved. Major problem is on the right foot. He has not had an x-ray. Deep tissue culture I did last week showed both Enterobacter and E. coli. I did not change the doxycycline I put him on empirically although neither 1 of these were plated to doxycycline. He arrives today with the erythema looking worse on both the dorsal and  plantar foot. Macerated skin on the bottom of the foot. he has not been systemically unwell 5/18-Patient returns at 1 week, left calf wound appears to be making some progress, left buttock wound appears slightly worse than last time, left foot wound looks slightly better, right foot redness is marginally better. X-ray of both feet show no air or evidence of osteomyelitis. Patient is finished his Omnicef and terbinafine. He continues to have macerated skin on the bottom of the left foot as well as right 5/26; left calf wound is better, left buttock wound appears to have multiple small superficial open areas with surrounding macerated skin. X-rays that I did last time showed no evidence of osteomyelitis in either foot. He is  finished cefdinir and doxycycline. I do not think that he was on terbinafine. He continues to have a large superficial open area on the right foot anterior dorsal and slightly between the first and second toes. I did send him to dermatology 2 months ago or so wondering about whether they would do a fungal scraping. I do not believe they did but did do a culture. We have been using silver alginate to the toe areas, he has been using antifungals at home topically either ketoconazole or Lotrisone. We are using silver collagen on the left foot, silver alginate on the right, silver collagen on the left lateral leg and silver alginate on the left buttock 6/1; left buttock area is healed. We have the left dorsal foot, left lateral leg and right dorsal foot. We are using silver alginate to the areas on both feet and silver collagen to the area on his left lateral calf 6/8; the left buttock apparently reopened late last week. He is not really sure how this happened. He is tolerating the terbinafine. Using silver alginate to all wounds 6/15; left buttock wound is larger than last week but still superficial. Came in the clinic today with a report of purulence from the left lateral leg I did not identify any infection Both areas on his dorsal feet appear to be better. He is tolerating the terbinafine. Using silver alginate to all wounds 6/22; left buttock is about the same this week, left calf quite a bit better. His left foot is about the same however he comes in with erythema and warmth in the right forefoot once again. Culture that I gave him in the beginning of May showed Enterobacter and E. coli. I gave him doxycycline and things seem to improve although neither 1 of these organisms was specifically plated. 6/29; left buttock is larger and dry this week. Left lateral calf looks to me to be improved. Left dorsal foot also somewhat improved right foot completely unchanged. The erythema on the right foot is  still present. He is completing the Ceftin dinner that I gave him empirically [see discussion above.) 7/6 - All wounds look to be stable and perhaps improved, the left buttock wound is slightly smaller, per patient bleeds easily, completed ceftin, the right foot redness is less, he is on terbinafine 7/13; left buttock wound about the same perhaps slightly narrower. Area on the left lateral leg continues to narrow. Left dorsal foot slightly smaller right foot about the same. We are using silver alginate on the right foot and Hydrofera Blue to the areas on the left. Unna boot on the left 2 layer compression on the right 7/20; left buttock wound absolutely the same. Area on lateral leg continues to get better. Left dorsal foot require debridement as did the  right no major change in the 7/27; left buttock wound the same size necrotic debris over the surface. The area on the lateral leg is closed once again. His left foot looks better right foot about the same although there is some involvement now of the posterior first second toe area. He is still on terbinafine which I have given him for a month, not certain a centimeter major change 06/25/19-All wounds appear to be slightly improved according to report, left buttock wound looks clean, both foot wounds have minimal to no debris the right dorsal foot has minimal slough. We are using Hydrofera Blue to the left and silver alginate to the right foot and ischial wound. 8/10-Wounds all appear to be around the same, the right forefoot distal part has some redness which was not there before, however the wound looks clean and small. Ischial wound looks about the same with no changes 8/17; his wound on the left lateral calf which was his original chronic venous insufficiency wound remains closed. Since I last saw him the areas on the left dorsal foot right dorsal foot generally appear better but require debridement. The area on his left initial tuberosity appears  somewhat larger to me perhaps hyper granulated and bleeds very easily. We have been using Hydrofera Blue to the left dorsal foot and silver alginate to everything else 8/24; left lateral calf remains closed. The areas on his dorsal feet on the webspace of the first and second toes bilaterally both look better. The area on the left buttock which is the pressure ulcer stage II slightly smaller. I change the dressing to Hydrofera Blue to all areas 8/31; left lateral calf remains closed. The area on his dorsal feet bilaterally look better. Using Hydrofera Blue. Still requiring debridement on the left foot. No change in the left buttock pressure ulcers however 9/14; left lateral calf remains closed. Dorsal feet look quite a bit better than 2 weeks ago. Flaking dry skin also a lot better with the ammonium lactate I gave him 2 weeks ago. The area on the left buttock is improved. He states that his Roho cushion developed a leak and he is getting a new one, in the interim he is offloading this vigorously 9/21; left calf remains closed. Left heel which was a possible DTI looks better this week. He had macerated tissue around the left dorsal foot right foot looks satisfactory and improved left buttock wound. I changed his dressings to his feet to silver alginate bilaterally. Continuing Hydrofera Blue on the left buttock. 9/28 left calf remains closed. Left heel did not develop anything [possible DTI] dry flaking skin on the left dorsal foot. Right foot looks satisfactory. Improved left buttock wound. We are using silver alginate on his feet Hydrofera Blue on the buttock. I have asked him to go back to the Lotrisone on his feet including the wounds and surrounding areas 10/5; left calf remains closed. The areas on the left and right feet about the same. A lot of this is epithelialized however debris over the remaining open areas. He is using Lotrisone and silver alginate. The area on the left buttock using  Hydrofera Blue 10/26. Patient has been out for 3 weeks secondary to Covid concerns. He tested negative but I think his wife tested positive. He comes in today with the left foot substantially worse, right foot about the same. Even more concerning he states that the area on his left buttock closed over but then reopened and is considerably deeper in one  aspect than it was before [stage III wound] 11/2; left foot really about the same as last week. Quarter sized wound on the dorsal foot just proximal to the first second toes. Surrounding erythema with areas of denuded epithelium. This is not really much different looking. Did not look like cellulitis this time however. Right foot area about the same.. We have been using silver alginate alginate on his toes Left buttock still substantial irritated skin around the wound which I think looks somewhat better. We have been using Hydrofera Blue here. 11/9; left foot larger than last week and a very necrotic surface. Right foot I think is about the same perhaps slightly smaller. Debris around the circumference also addressed. Unfortunately on the left buttock there is been a decline. Satellite lesions below the major wound distally and now a an additional one posteriorly we have been using Hydrofera Blue but I think this is a pressure issue 11/16; left foot ulcer dorsally again a very adherent necrotic surface. Right foot is about the same. Not much change in the pressure ulcer on his left buttock. 11/30; left foot ulcer dorsally basically the same as when I saw him 2 weeks ago. Very adherent fibrinous debris on the wound surface. Patient reports a lot of drainage as well. 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 We have been using Iodoflex, patient changed back to alginate because of the drainage. Area on his right dorsal foot really looks benign with a healthier surface certainly a lot better than on the left.  Left buttock wounds all improved using Hydrofera Blue 12/7; left dorsal foot again no improvement. Tightly adherent debris. PCR culture I did last week only showed likely skin contaminant. I have gone ahead and done a punch biopsy of this which is about the last thing in terms of investigations I can think to do. He has known venous insufficiency and venous hypertension and this could be the issue here. The area on the right foot is about the same left buttock slightly worse according to our intake nurse secondary to [facility] Blue sticking to the wound 12/14; biopsy of the left foot that I did last time showed changes that could be related to wound healing/chronic stasis dermatitis phenomenon no neoplasm. We have been using silver alginate to both feet. I change the one on the left today to Sorbact and silver alginate to his other 2 wounds 12/28; the patient arrives with the following problems; Major issue is the dorsal left foot which continues to be a larger deeper wound area. Still with a completely nonviable surface Paradoxically the area mirror image on the right on the right dorsal foot appears to be getting better. He had some loss of dry denuded skin from the lower part of his original wound on the left lateral calf. Some of this area looked a little vulnerable and for this reason we put him in wrap that on this side this week The area on his left buttock is larger. He still has the erythematous circular area which I think is a combination of pressure, sweat. This does not look like cellulitis or fungal dermatitis 11/26/2019; -Dorsal left foot large open wound with depth. Still debris over the surface. Using Sorbact The area on the dorsal right foot paradoxically has closed over He has a reopening on the left ankle laterally at the base of his original wound that extended up into the calf. This appears clean. The left buttock wound is  smaller but with very adherent necrotic debris over the  surface. We have been using silver alginate here as well The patient had arterial studies done in 2017. He had biphasic waveforms at the dorsalis pedis and posterior tibial bilaterally. ABI in the left was 1.17. Digit waveforms were dampened. He has slight spasticity in the great toes I do not think a TBI would be possible 1/11; the patient comes in today with a sizable reopening between the first and second toes on the right. This is not exactly in the same location where we have been treating wounds previously. According to our intake nurse this was actually fairly deep but 0.6 cm. The area on the left dorsal foot looks about the same the surface is somewhat cleaner using Sorbact, his MRI is in 2 days. We have not managed yet to get arterial studies. The new reopening on the left lateral calf looks somewhat better using alginate. The left buttock wound is about the same using alginate 1/18; the patient had his ARTERIAL studies which were quite normal. ABI in the right at 1.13 with triphasic/biphasic waveforms on the left ABI 1.06 again with triphasic/biphasic waveforms. It would not have been possible to have done a toe brachial index because of spasticity. We have been using Sorbac to the left foot alginate to the rest of his wounds on the right foot left lateral calf and left buttock 1/25; arrives in clinic with erythema and swelling of the left forefoot worse over the first MTP area. This extends laterally dorsally and but also posteriorly. Still has an area on the left lateral part of the lower part of his calf wound it is eschared and clearly not closed. Area on the left buttock still with surrounding irritation and erythema. Right foot surface wound dorsally. The area between the right and first and second toes appears better. 2/1; The left foot wound is about the same. Erythema slightly better I gave him a week of doxycycline empirically Right foot wound is more extensive extending between  the toes to the plantar surface Left lateral calf really no open surface on the inferior part of his original wound however the entire area still looks vulnerable Absolutely no improvement in the left buttock wound required debridement. 2/8; the left foot is about the same. Erythema is slightly improved I gave him clindamycin last week. Right foot looks better he is using Lotrimin and silver alginate He has a breakdown in the left lateral calf. Denuded epithelium which I have removed Left buttock about the same were using Hydrofera Blue 2/15; left foot is about the same there is less surrounding erythema. Surface still has tightly adherent debris which I have debriding however not making any progress Right foot has a substantial wound on the medial right second toe between the first and second webspace. Still an open area on the left lateral calf distal area. Buttock wound is about the same 2/22; left foot is about the same less surrounding erythema. Surface has adherent debris. Polymen Ag Right foot area significant wound between the first and second toes. We have been using silver alginate here Left lateral leg polymen Ag at the base of his original venous insufficiency wound Left buttock some improvement here 3/1; Right foot is deteriorating in the first second toe webspace. Larger and more substantial. We have been using silver alginate. Left dorsal foot about the same markedly adherent surface debris using PolyMem Ag Left lateral calf surface debris using PolyMem AG Left buttock is improved  again using PolyMem Ag. He is completing his terbinafine. The erythema in the foot seems better. He has been on this for 2 weeks 3/8; no improvement in any wound area in fact he has a small open area on the dorsal midfoot which is new this week. He has not gotten his foot x-rays yet 3/15; his x-rays were both negative for osteomyelitis of both feet. No major change in any of his wounds on the  extremities however his buttock wounds are better. We have been using polymen on the buttocks, left lower leg. Iodoflex on the left foot and silver alginate on the right 3/22; arrives in clinic today with the 2 major issues are the improvement in the left dorsal foot wound which for once actually looks healthy with a nice healthy wound surface without debridement. Using Iodoflex here. Unfortunately on the left lateral calf which is in the distal part of his original wound he came to the clinic here for there was purulent drainage noted some increased breakdown scattered around the original area and a small area proximally. We we are using polymen here will change to silver alginate today. His buttock wound on the left is better and I think the area on the right first second toe webspace is also improved 3/29; left dorsal foot looks better. Using Iodoflex. Left ankle culture from deterioration last time grew E. coli, Enterobacter and Enterococcus. I will give him a course of cefdinir although that will not cover Enterococcus. The area on the right foot in the webspace of the first and second toe lateral first toe looks better. The area on his buttock is about healed Vascular appointment is on April 21. This is to look at his venous system vis--vis continued breakdown of the wounds on the left including the left lateral leg and left dorsal foot he. He has had previous ablations on this side 4/5; the area between the right first and second toes lateral aspect of the first toe looks better. Dorsal aspect of the left first toe on the left foot also improved. Unfortunately the left lateral lower leg is larger and there is a second satellite wound superiorly. The usual superficial abrasions on the left buttock overall better but certainly not closed 4/12; the area between the right first and second toes is improved. Dorsal aspect of the left foot also slightly smaller with a vibrant healthy looking  surface. No real change in the left lateral leg and the left buttock wound is healed He has an unaffordable co-pay for Apligraf. Appointment with vein and vascular with regards to the left leg venous part of the circulation is on 4/21 4/19; we continue to see improvement in all wound areas. Although this is minor. He has his vascular appointment on 4/21. The area on the left buttock has not reopened although right in the center of this area the skin looks somewhat threatened 4/26; the left buttock is unfortunately reopened. In general his left dorsal foot has a healthy surface and looks somewhat smaller although it was not measured as such. The area between his first and second toe webspace on the right as a small wound against the first toe. The patient saw vascular surgery. The real question I was asking was about the small saphenous vein on the left. He has previously ablated left greater saphenous vein. Nothing further was commented on on the left. Right greater saphenous vein without reflux at the saphenofemoral junction or proximal thigh there was no indication for ablation of  the right greater saphenous vein duplex was negative for DVT bilaterally. They did not think there was anything from a vascular surgery point of view that could be offered. They ABIs within normal limits 5/3; only small open area on the left buttock. The area on the left lateral leg which was his original venous reflux is now 2 wounds both which look clean. We are using Iodoflex on the left dorsal foot which looks healthy and smaller. He is down to a very tiny area between the right first and second toes, using silver alginate 5/10; all of his wounds appear better. We have much better edema control in 4 layer compression on the left. This may be the factor that is allowing the left foot and left lateral calf to heal. He has external compression garments at home 04/14/20-All of his wounds are progressing well, the left  forefoot is practically closed, left ischium appears to be about the same, right toe webspace is also smaller. The left lateral leg is about the same, continue using Hydrofera Blue to this, silver alginate to the ischium, Iodoflex to the toe space on the right 6/7; most of his wounds outside of the left buttock are doing well. The area on the left lateral calf and left dorsal foot are smaller. The area on the right foot in between the first and second toe webspace is barely visible although he still says there is some drainage here is the only reason I did not heal this out. Unfortunately the area on the left buttock almost looks like he has a skin tear from tape. He has open wound and then a large flap of skin that we are trying to get adherence over an area just next to the remaining wound 6/21; 2 week follow-up. I believe is been here for nurse visits. Miraculously the area between his first and second toes on the left dorsal foot is closed over. Still open on the right first second web space. The left lateral calf has 2 open areas. Distally this is more superficial. The proximal area had a little more depth and required debridement of adherent necrotic material. His buttock wound is actually larger we have been using silver alginate here 6/28; the patient's area on the left foot remains closed. Still open wet area between the first and second toes on the right and also extending into the plantar aspect. We have been using silver alginate in this location. He has 2 areas on the left lower leg part of his original long wounds which I think are better. We have been using Hydrofera Blue here. Hydrofera Blue to the left buttock which is stable 7/12; left foot remains closed. Left ankle is closed. May be a small area between his right first and second toes the only truly open area is on the left buttock. We have been using Hydrofera Blue here 7/19; patient arrives with marked deterioration especially in  the left foot and ankle. We did not put him in a compression wrap on the left last week in fact he wore his juxta lite stockings on either side although he does not have an underlying stocking. He has a reopening on the left dorsal foot, left lateral ankle and a new area on the right dorsal ankle. More worrisome is the degree of erythema on the left foot extending on the lateral foot into the lateral lower leg on the left 7/26; the patient had erythema and drainage from the lateral left ankle last week. Culture  of this grew MRSA resistant to doxycycline and clindamycin which are the 2 antibiotics we usually use with this patient who has multiple antibiotic allergies including linezolid, trimethoprim sulfamethoxazole. I had give him an empiric doxycycline and he comes in the area certainly looks somewhat better although it is blotchy in his lower leg. He has not been systemically unwell. He has had areas on the left dorsal foot which is a reopening, chronic wounds on the left lateral ankle. Both of these I think are secondary to chronic venous insufficiency. The area between his first and second toes is closed as far as I can tell. He had a new wrap injury on the right dorsal ankle last week. Finally he has an area on the left buttock. We have been using silver alginate to everything except the left buttock we are using Hydrofera Blue 06/30/20-Patient returns at 1 week, has been given a sample dose pack of NUZYRA which is a tetracycline derivative [omadacycline], patient has completed those, we have been using silver alginate to almost all the wounds except the left ischium where we are using Hydrofera Blue all of them look better 8/16; since I last saw the patient he has been doing well. The area on the left buttock, left lateral ankle and left foot are all closed today. He has completed the [place] I gave him last time and tolerated this well. He still has open areas on the right dorsal ankle and in the  right first second toe area which we are using silver alginate. 8/23; we put him in his bilateral external compression stockings last week as he did not have anything open on either leg except for concerning area between the right first and second toe. He comes in today with an area on the left dorsal foot slightly more proximal than the original wound, the left lateral foot but this is actually a continuation of the area he had on the left lateral ankle from last time. As well he is opened up on the left buttock again. 8/30; comes in today with things looking a lot better. The area on the left lower ankle has closed down as has the left foot but with eschar in both areas. The area on the dorsal right ankle is also epithelialized. Very little remaining of the left buttock wound. We have been using silver alginate on all wound areas 9/13; the area in the first second toe webspace on the right has fully epithelialized. He still has some vulnerable epithelium on the right and the ankle and the dorsal foot. He notes weeping. He is using his juxta lite stocking. On the left again the left dorsal foot is closed left lateral ankle is closed. We went to the juxta lite stocking here as well. Still vulnerable in the left buttock although only 2 small open areas remain here 9/27; 2-week follow-up. We did not look at his left leg but the patient says everything is closed. He is a bit disturbed by the amount of edema in his left foot he is using juxta lite stockings but asking about over the toes stockings which would be 30/40, will talk to him next time. According to him there is no open wound on either the left foot or the left ankle/calf He has an open area on the dorsal right calf which I initially point a wrap injury. He has superficial remaining wound on the left ischial tuberosity been using silver alginate although he says this sticks to the wound 10/5;  we gave him 2-week follow-up but he called yesterday  expressing some concerns about his right foot right ankle and the left buttock. He came in early. There is still no open areas on the left leg and that still in his juxta lite stocking Electronic Signature(s) Signed: 08/26/2020 5:35:49 PM By: Linton Ham MD Entered By: Linton Ham on 08/26/2020 17:23:17 -------------------------------------------------------------------------------- Physical Exam Details Patient Name: Date of Service: Mcafee, [name] 08/26/2020 2:30 PM Medical Record Number: [id number] Patient Account Number: [id number] Date of Birth/Sex: Treating RN: 1986-01-11 (34 y.o. [name] Primary Care Provider: Other Clinician: Janine Limbo Referring Provider: Treating Provider/Extender: [name]: 6 Constitutional Patient is hypertensive.. Pulse regular and within target range for patient.Marland Kitchen Respirations regular, non-labored and within target range.. Temperature is normal and within the target range for the patient.Marland Kitchen Appears in no distress. Notes Wound exam; Left buttock actually this looks really good. There is still smattering some small open areas however the skin around this the really looks quite good there apparently using some combination of zinc oxide and calamine. I like to know what this is The other problem is on the right dorsal foot and right dorsal ankle. Once again the dorsal foot has broken down with an open area with it it very adherent necrotic surface. There is surrounding erythema. Similar area on the ankle crease on the right as well Electronic Signature(s) Signed: 08/26/2020 5:35:49 PM By: Linton Ham MD Entered By: Linton Ham on 08/26/2020 17:24:38 -------------------------------------------------------------------------------- Physician Orders Details Patient Name: Date of Service: Redlich, [name] 08/26/2020 2:30 PM Medical Record Number: [id number] Patient Account Number: [id number] Date of  Birth/Sex: Treating RN: [birth year] (34 y.o. [name] Primary Care Provider: [name], [name] Other Clinician: Referring Provider: Treating Provider/Extender: [name]: [phone] Verbal / Phone Orders: No Diagnosis Coding ICD-10 Coding Code Description I87.332 Chronic venous hypertension (idiopathic) with ulcer and inflammation of left lower extremity L97.321 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 to breakdown of skin L97.311 Non-pressure chronic ulcer of right ankle limited to breakdown of skin L97.511 Non-pressure chronic ulcer of other part of right foot limited to breakdown of skin L97.521 Non-pressure chronic ulcer of other part of left foot limited to breakdown of 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 Dressing Change Frequency Wound #38R Right T - Web between 1st and 2nd oe Change Dressing every other day. Wound #41R Left Ischium Change Dressing every other day. Wound #46 Right,Dorsal Ankle Change Dressing every other day. Skin Barriers/Peri-Wound Care Moisturizing lotion - both legs daily Other: - lotrimen to periwound of right foot webbing 1st / 2nd toe Wound Cleansing May shower and wash wound with soap and water. - on days that dressing is changed Primary Wound Dressing Wound #38R Right T - Web between 1st and 2nd oe Calcium Alginate with Silver Wound #41R Left Ischium Calcium Alginate with Silver Wound #46 Right,Dorsal Ankle Calcium Alginate with Silver Secondary Dressing Wound #38R Right T - Web between 1st and 2nd oe Kerlix/Rolled Gauze Dry Gauze Wound #41R Left Ischium Foam Border - or ABD pad and tape Wound #46 Right,Dorsal Ankle Foam Border Edema Control Elevate legs to the level of the heart or above for 30 minutes daily and/or when sitting, a frequency of: - throughout the day Support Garment 30-40 mm/Hg  pressure to: - Juxtalite to both legs daily Off-Loading Low air-loss mattress (Group  2) Roho cushion for wheelchair Turn and reposition every 2 hours - out of wheelchair throughout the day, try to lay on sides, sleep in the bed not the recliner Electronic Signature(s) Signed: 08/26/2020 5:35:40 PM By: Carlene Coria RN Signed: 08/26/2020 5:35:49 PM By: Linton Ham MD Entered By: Carlene Coria on 08/26/2020 15:53:58 -------------------------------------------------------------------------------- Problem List Details Patient Name: Date of Service: Peale, [name] 08/26/2020 2:30 PM Medical Record Number: [id number] Patient Account Number: [id number] Date of Birth/Sex: Treating RN: 1986-08-08 (34 y.o. [name] Primary Care Provider: [place], [place] [employer] Other Clinician: Referring Provider: Treating Provider/Extender: [name]: 242 Active Problems ICD-10 Encounter Code Description Active Date MDM Diagnosis I87.332 Chronic venous hypertension (idiopathic) with ulcer and inflammation of left 02/25/2020 No Yes lower extremity L97.321 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 to breakdown of skin 11/26/2019 No Yes L97.311 Non-pressure chronic ulcer of right ankle limited to breakdown of skin 06/09/2020 No Yes L97.511 Non-pressure chronic ulcer of other part of right foot limited to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 bilaterally in the anterior and posterior web space. I had been using Iodoflex here after a prolonged course of silver alginate with ketoconazole was ineffective [question tinea pedis] 07/14/17; patient arrives today with a very difficult adherent material over his left lateral lower leg wound. He also has surrounding erythema and poorly controlled edema. He was switched his Santyl last visit which the nurses are applying once during his doctor visit and once on a nurse visit. He was also reduced to 2 layer compression I'm not exactly sure of the issue here. 07/21/17; better surface today after 1 week of Iodoflex. Significant cellulitis that we treated last week also better. [Doxycycline] 07/28/17 better surface today with now 2 weeks of Iodoflex. Significant cellulitis treated with doxycycline. He has now completed the doxycycline and he is back to his usual degree of chronic venous inflammation/stasis dermatitis. He reminds me he has had ablations surgery here 08/04/17; continued improvement with Iodoflex to the left lateral leg wound in terms of the surface of the wound although the dimensions are better. 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 Problematic areas on the plantar aspect of the first second toe web space on the left and the dorsal aspect of the first second toe web space on the right. At one point I felt these were probably related to chronic fungal infections in treated him aggressively for this although we have not made any improvement here. 08/11/17; left lateral leg. Surface continues to improve with the Iodoflex although we are not seeing much improvement in overall wound dimensions. Areas on his plantar left foot and right foot show  no improvement. In fact the right foot looks somewhat worse 08/18/17; left lateral leg. We changed to [facility] Blue last week after a prolonged course of Iodoflex which helps get the surface better. It appears that the wound with is improved. Continue with difficult areas on the left dorsal first second and plantar first second on the right 09/01/17; patient arrives in clinic today having had a temperature of 103 yesterday. He was seen in the ER and [facility]. The patient was concerned he could have cellulitis again in the right leg however they diagnosed him with a UTI and he is now on Keflex. He has a history of cellulitis which is been recurrent and difficult but this is been in the left leg, in the past 5 use doxycycline. He does in and out catheterizations at home which are risk factors for UTI 09/08/17; patient will be completing his Keflex this weekend. The erythema on the left leg is considerably better. He has a new wound today on the medial part of the right leg small superficial almost looks like a skin tear. He has worsening of the area on the right dorsal first and second toe. His major area on the left lateral leg is better. Using Hydrofera Blue on all areas 09/15/17; gradual reduction in width on the long wound in the left lateral leg. No debridement required. He also has wounds on the plantar aspect of his left first second toe web space and on the dorsal aspect of the right first second toe web space. 09/22/17; there continues to be very gradual improvements in the dimensions of the left lateral  leg wound. He hasn't round erythematous spot with might be pressure on his wheelchair. There is no evidence obviously of infection no purulence no warmth ooHe has a dry scaled area on the plantar aspect of the left first second toe ooImproved area on the dorsal right first second toe. 09/29/17; left lateral leg wound continues to improve in dimensions mostly with an is still a fairly long but  increasingly narrow wound. ooHe has a dry scaled area on the plantar aspect of his left first second toe web space ooIncreasingly concerning area on the dorsal right first second toe. In fact I am concerned today about possible cellulitis around this wound. The areas extending up his second toe and although there is deformities here almost appears to abut on the nailbed. 10/06/17; left lateral leg wound continues to make very gradual progress. Tissue culture I did from the right first second toe dorsal foot last time grew MRSA and enterococcus which was vancomycin sensitive. This was not sensitive to clindamycin or doxycycline. He is allergic to Zyvox and sulfa we have therefore arrange for him to have dalvance infusion tomorrow. He is had this in the past and tolerated it well 10/20/17; left lateral leg wound continues to make decent progress. This is certainly reduced in terms of with there is advancing epithelialization.ooThe cellulitis in the right foot looks better although he still has a deep wound in the dorsal aspect of the first second toe web space. Plantar left first toe web space on the left I think is making some progress 10/27/17; left lateral leg wound continues to make decent progress. Advancing epithelialization.using Hydrofera Blue ooThe right first second toe web space wound is better-looking using silver alginate ooImprovement in the left plantar first second toe web space. Again using silver alginate 11/03/17 left lateral leg wound continues to make decent progress albeit slowly. Using Hydrofera Blue ooThe right per second toe web space continues to be a very problematic looking punched out wound. I obtained a piece of tissue for deep culture I did extensively treated this for fungus. It is difficult to imagine that this is a pressure area as the patient states other than going outside he doesn't really wear shoes at home ooThe left plantar first second toe web space  looked fairly senescent. Necrotic edges. This required debridement oochange to Hydrofera Blue to all wound areas 11/10/17; left lateral leg wound continues to contract. Using Hydrofera Blue ooOn the right dorsal first second toe web space dorsally. Culture I did of this area last week grew MRSA there is not an easy oral option in this patient was multiple antibiotic allergies or intolerances. This was only a rare culture isolate I'm therefore going to use Bactroban under silver alginate ooOn the left plantar first second toe web space. Debridement is required here. This is also unchanged 11/17/17; left lateral leg wound continues to contract using Hydrofera Blue this is no longer the major issue. ooThe major concern here is the right first second toe web space. He now has an open area going from dorsally to the plantar aspect. There is now wound on the inner lateral part of the first toe. Not a very viable surface on this. There is erythema spreading medially into the forefoot. ooNo major change in the left first second toe plantar wound 11/24/17; left lateral leg wound continues to contract using Hydrofera Blue. Nice improvement today ooThe right first second toe web space all of this looks a lot less angry than last week. I  have given him clindamycin and topical Bactroban for MRSA and terbinafine for the possibility of underlining tinea pedis that I could not control with ketoconazole. Looks somewhat better ooThe area on the plantar left first second toe web space is weeping with dried debris around the wound 12/01/17; left lateral leg wound continues to contract he Hydrofera Blue. It is becoming thinner in terms of with nevertheless it is making good improvement. ooThe right first second toe web space looks less angry but still a large necrotic-looking wounds starting on the plantar aspect of the right foot extending between the toes and now extensively on the base of the right second toe. I  gave him clindamycin and topical Bactroban for MRSA anterior benefiting for the possibility of underlying tinea pedis. Not looking better today ooThe area on the left first/second toe looks better. Debrided of necrotic debris 12/05/17* the patient was worked in urgently today because over the weekend he found blood on his incontinence bad when he woke up. He was found to have an ulcer by his wife who does most of his wound care. He came in today for [place] to look at this. He has not had a history of wounds in his buttocks in spite of his paraplegia. 12/08/17; seen in follow-up today at his usual appointment. He was seen earlier this week and found to have a new wound on his buttock. We also follow him for wounds on the left lateral leg, left first second toe web space and right first second toe web space 12/15/17; we have been using Hydrofera Blue to the left lateral leg which has improved. The right first second toe web space has also improved. Left first second toe web space plantar aspect looks stable. The left buttock has worsened using Santyl. Apparently the buttock has drainage 12/22/17; we have been using Hydrofera Blue to the left lateral leg which continues to improve now 2 small wounds separated by normal skin. He tells [place] he had a fever up to 100 yesterday he is prone to UTIs but has not noted anything different. He does in and out catheterizations. The area between the first and second toes today does not look good necrotic surface covered with what looks to be purulent drainage and erythema extending into the third toe. I had gotten this to something that I thought look better last time however it is not look good today. He also has a necrotic surface over the buttock wound which is expanded. I thought there might be infection under here so I removed a lot of the surface with a #5 curet though nothing look like it really needed culturing. He is been using Santyl to this area 12/27/17; his  original wound on the left lateral leg continues to improve using Hydrofera Blue. I gave him samples of Baxdella although he was unable to take them out of fear for an allergic reaction ["lump in his throat"].the culture I did of the purulent drainage from his second toe last week showed both enterococcus and a set Enterobacter I was also concerned about the erythema on the bottom of his foot although paradoxically although this looks somewhat better today. 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 We've been using silver alginate here. They came up today that he sleeps in a recliner, I'm not sure why but I asked him to stop this 01/03/18; his original wound we've been using [facility]  is now separated into 2 areas. ooUlcer on his left buttock is better he is off the recliner and sleeping in bed ooFinally both wound areas between his first and second toes also looks some better 01/10/18; his original wound on the left lateral leg is now separated into 2 wounds we've been using Hydrofera Blue ooUlcer on his left buttock has some drainage. There is a small probing site going into muscle layer superiorly.using silver alginate -He arrives today with a deep tissue injury on the left heel ooThe wound on the dorsal aspect of his first second toe on the left looks a lot betterusing silver alginate ketoconazole ooThe area on the first second toe web space on the right also looks a lot bette 01/17/18; his original wound on the left lateral leg continues to progress using Hydrofera Blue ooUlcer on his left buttock also is smaller surface healthier except for a small probing site going into the muscle layer superiorly. 2.4 cm of tunneling in this area ooDTI on his left heel we have only been offloading. Looks better than last week no threatened open no evidence of infection oothe wound on the dorsal aspect of  the first second toe on the left continues to look like it's regressing we have only been using silver alginate and terbinafine orally ooThe area in the first second toe web space on the right also looks to be a lot better using silver alginate and terbinafine I think this was prompted by tinea pedis 01/31/18; the patient was hospitalized in [place] last week apparently for a complicated UTI. He was discharged on cefepime he does in and out catheterizations. In the hospital he was discovered M I don't mild elevation of AST and ALT and the terbinafine was stopped.predictably the pressure ulcer on s his buttock looks betterusing silver alginate. The area on the left lateral leg also is better using Hydrofera Blue. The area between the first and second toes on the left better. First and second toes on the right still substantial but better. Finally the DTI on the left heel has held together and looks like it's resolving 02/07/18-he is here in follow-up evaluation for multiple ulcerations. He has new injury to the lateral aspect of the last issue a pressure ulcer, he states this is from adhesive removal trauma. He states he has tried multiple adhesive products with no success. All other ulcers appear stable. The left heel DTI is resolving. We will continue with same treatment plan and follow-up next week. 02/14/18; follow-up for multiple areas. ooHe has a new area last week on the lateral aspect of his pressure ulcer more over the posterior trochanter. The original pressure ulcer looks quite stable has healthy granulation. 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 Almost fully epithelialized on the original superior area using Hydrofera Blue ooDTI on the left heel has peeled off this week to reveal a small superficial wound under denuded skin and subcutaneous tissue ooBoth areas between the  first and second toes look better including nothing open on the left 02/21/18; ooThe patient's wounds on his left ischial tuberosity and posterior left greater trochanter actually looked better. He has a large area of irritation around the area which I think is contact dermatitis. I am doubtful that this is fungal ooHis original wound on the left lateral calf continues to improve we have been using Hydrofera Blue ooThere is no open area in the  left first second toe web space although there is a lot of thick callus ooThe DTI on the left heel required debridement today of necrotic surface eschar and subcutaneous tissue using silver alginate ooFinally the area on the right first second toe webspace continues to contract using silver alginate and ketoconazole 02/28/18 ooLeft ischial tuberosity wounds look better using silver alginate. ooOriginal wound on the left calf only has one small open area left using Hydrofera Blue ooDTI on the left heel required debridement mostly removing skin from around this wound surface. Using silver alginate ooThe areas on the right first/second toe web space using silver alginate and ketoconazole 03/08/18 on evaluation today patient appears to be doing decently well as best I can tell in regard to his wounds. This is the first time that I have seen him as he generally is followed by Dr. Dellia Nims. With that being said none of his wounds appear to be infected he does have an area where there is some skin covering what appears to be a new wound on the left dorsal surface of his great toe. This is right at the nail bed. With that being said I do believe that debrided away some of the excess skin can be of benefit in this regard. Otherwise he has been tolerating the dressing changes without complication. 03/14/18; patient arrives today with the multiplicity of wounds that we are following. He has not been systemically unwell ooOriginal wound on the left lateral calf now  only has 2 small open areas we've been using Hydrofera Blue which should continue ooThe deep tissue injury on the left heel requires debridement today. We've been using silver alginate ooThe left first second toe and the right first second toe are both are reminiscence what I think was tinea pedis. Apparently some of the callus Surface between the toes was removed last week when it started draining. ooPurulent drainage coming from the wound on the ischial tuberosity on the left. 03/21/18-He is here in follow-up evaluation for multiple wounds. 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 He tolerated debridement. The only change to last week's recommendations is to discontinue antifungal cream between toes. He will follow-up next week 03/28/18; following up for multiple wounds;Concern this week is streaking redness and swelling in the right foot. He is going to need antibiotics for this. 03/31/18; follow-up for right foot cellulitis. Streaking redness and swelling in the right foot on 03/28/18. 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 I put him on clindamycin 300 mg every 6 and brought him in for a quick check. He has an open wound between his first and second toes on the right foot as a potential source. 04/04/18; ooRight foot cellulitis is resolving he is completing clindamycin. This is truly good news ooLeft lateral calf wound which is initial wound only has one small open area inferiorly this is close to healing out. He has compression stockings. We will use Hydrofera Blue right down to the epithelialization of this ooNonviable surface on the left heel which was initially pressure with a DTI. We've been using Hydrofera Blue. I'm going to switch this back to silver alginate ooLeft first second toe/tinea pedis this looks better using silver alginate ooRight first second toe tinea pedis using silver  alginate ooLarge pressure ulcers on theLeft ischial tuberosity. Small wound here Looks better. I am uncertain about the surface over the large wound. Using silver alginate 04/11/18; ooCellulitis in the right foot  is resolved ooLeft lateral calf wound which was his original wounds still has 2 tiny open areas remaining this is just about closed ooNonviable surface on the left heel is better but still requires debridement ooLeft first second toe/tinea pedis still open using silver alginate ooRight first second toe wound tinea pedis I asked him to go back to using ketoconazole and silver alginate ooLarge pressure ulcers on the left ischial tuberosity this shear injury here is resolved. Wound is smaller. No evidence of infection using silver alginate 04/18/18; ooPatient arrives with an intense area of cellulitis in the right mid lower calf extending into the right heel area. Bright red and warm. Smaller area on the left anterior leg. He has a significant history of MRSA. He will definitely need antibioticsoodoxycycline ooHe now has 2 open areas on the left ischial tuberosity the original large wound and now a satellite area which I think was above his initial satellite areas. Not a wonderful surface on this satellite area surrounding erythema which looks like pressure related. ooHis left lateral calf wound again his original wound is just about closed ooLeft heel pressure injury still requiring debridement ooLeft first second toe looks a lot better using silver alginate ooRight first second toe also using silver alginate and ketoconazole cream also looks better 04/20/18; the patient was worked in early today out of concerns with his cellulitis on the right leg. I had started him on doxycycline. This was 2 days ago. His wife was concerned about the swelling in the area. Also concerned about the left buttock. He has not been systemically unwell no fever chills. No nausea vomiting or  diarrhea 04/25/18; the patient's left buttock wound is continued to deteriorate he is using Hydrofera Blue. He is still completing clindamycin for the cellulitis on the right leg although all of this looks better. 05/02/18 ooLeft buttock wound still with a lot of drainage and a very tightly adherent fibrinous necrotic surface. He has a deeper area superiorly ooThe left lateral calf wound is still closed ooDTI wound on the left heel necrotic surface especially the circumference using Iodoflex ooAreas between his left first second toe and right first second toe both look better. Dorsally and the right first second toe he had a necrotic surface although at smaller. In using silver alginate and ketoconazole. I did a culture last week which was a deep tissue culture of the reminiscence of the open wound on the right first second toe dorsally. This grew a few Acinetobacter and a few methicillin-resistant staph aureus. Nevertheless the area actually this week looked better. I didn't feel the need to specifically address this at least in terms of systemic antibiotics. 05/09/18; wounds are measuring larger more drainage per our intake. We are using Santyl covered with alginate on the large superficial buttock wounds, Iodosorb on the left heel, ketoconazole and silver alginate to the dorsal first and second toes bilaterally. 05/16/18; ooThe area on his left buttock better in some aspects although the area superiorly over the ischial tuberosity required an extensive debridement.using Santyl ooLeft heel appears stable. Using Iodoflex ooThe areas between his first and second toes are not bad however there is spreading erythema up the dorsal aspect of his left foot this looks like cellulitis again. He is insensate the erythema is really very brilliant.o Erysipelas He went to see an allergist days ago because he was itching part of this he had lab work done. This showed a white count of 15.1 with 70%  neutrophils. Hemoglobin of 11.4 and  a platelet count of 659,000. Last white count we had in Epic was a 2-1/2 years ago which was 25.9 but he was ill at the time. He was able to show me some lab work that was done by his primary physician the pattern is about the same. 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 But prompted me to go ahead and do x-rays of both feet and the pelvis rule out osteomyelitis. He also had a comprehensive metabolic panel this was reasonably normal his albumin was 3.7 liver function tests BUN/creatinine all normal 05/23/18; x-rays of both his feet from last week were negative for underlying pulmonary abnormality. The x-ray of his pelvis however showed mild irregularity in the left ischial which may represent some early osteomyelitis. The wound in the left ischial continues to get deeper clearly now exposed muscle. Each week necrotic surface material over this area. Whereas the rest of the wounds do not look so bad. ooThe left ischial wound we have been using Santyl and calcium alginate ooT the left heel surface necrotic debris using Iodoflex o ooThe left lateral leg is still healed ooAreas on the left dorsal foot and the right dorsal foot are about the same. There is some inflammation on the left which might represent contact dermatitis, fungal dermatitis I am doubtful cellulitis although this looks better than last week 05/30/18; CT scan done at Hospital did not show any osteomyelitis or abscess. Suggested the possibility of underlying cellulitis although I don't see a lot of evidence of this at the bedside ooThe wound itself on the left buttock/upper thigh actually looks somewhat better. No debridement ooLeft heel also looks better no debridement continue Iodoflex ooBoth dorsal first second toe spaces appear better using Lotrisone. Left still required debridement 06/06/18; ooIntake reported some purulent looking drainage  from the left gluteal wound. Using Santyl and calcium alginate ooLeft heel looks better although still a nonviable surface requiring debridement ooThe left dorsal foot first/second webspace actually expanding and somewhat deeper. I may consider doing a shave biopsy of this area ooRight dorsal foot first/second webspace appears stable to improved. Using Lotrisone and silver alginate to both these areas 06/13/18 ooLeft gluteal surface looks better. Now separated in the 2 wounds. No debridement required. Still drainage. We'll continue silver alginate ooLeft heel continues to look better with Iodoflex continue this for at least another week ooOf his dorsal foot wounds the area on the left still has some depth although it looks better than last week. We've been using Lotrisone and silver alginate 06/20/18 ooLeft gluteal continues to look better healthy tissue ooLeft heel continues to look better healthy granulation wound is smaller. He is using Iodoflex and his long as this continues continue the Iodoflex ooDorsal right foot looks better unfortunately dorsal left foot does not. There is swelling and erythema of his forefoot. He had minor trauma to this several days ago but doesn't think this was enough to have caused any tissue injury. Foot looks like cellulitis, we have had this problem before 06/27/18 on evaluation today patient appears to be doing a little worse in regard to his foot ulcer. 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 Both of which would really be his only options for treating this infection. In the past he has been given and effusion of Orbactiv. This is done  very well for him in the past again it's one time dosing IV antibiotic therapy. Subsequently I do believe this is something we're gonna need to see about doing at this point in time.  Currently his other wounds seem to be doing somewhat better in my pinion I'm pretty happy in that regard. 07/03/18 on evaluation today patient's wounds actually appear to be doing fairly well. He has been tolerating the dressing changes without complication. All in all he seems to be showing signs of improvement. In regard to the antibiotics he has been dealing with infectious disease since I saw him last week as far as getting this scheduled. In the end [facility]'s [facility] this coming Friday. In the meantime he has been continuing to perform the dressing changes in such as previous. There does not appear to be any evidence of infection worsengin at this time. 07/10/18; ooSince I last saw this man 2 weeks ago things have actually improved. IV antibiotics of resulted in less forefoot erythema although there is still some present. He is not systemically unwell ooLeft buttock wounds o2 now have no depth there is increased epithelialization Using silver alginate ooLeft heel still requires debridement using Iodoflex ooLeft dorsal foot still with a sizable wound about the size of a border but healthy granulation ooRight dorsal foot still with a slitlike area using silver alginate 07/18/18; the patient's cellulitis in the left foot is improved in fact I think it is on its way to resolving. ooLeft buttock wounds o2 both look better although the larger one has hypertension granulation we've been using silver alginate ooLeft heel has some thick circumferential redundant skin over the wound edge which will need to be removed today we've been using Iodoflex ooLeft dorsal foot is still a sizable wound required debridement using silver alginate ooThe right dorsal foot is just about closed only a small open area remains here 07/25/18; left foot cellulitis is resolved ooLeft buttock wounds o2 both look better. Hyper-granulation on the major  area ooLeft heel as some debris over the surface but otherwise looks a healthier wound. Using silver collagen ooRight dorsal foot is just about closed 07/31/18; arrives with our intake nurse worried about purulent drainage from the buttock. We had hyper-granulation here last week ooHis buttock wounds o2 continue to look better ooLeft heel some debris over the surface but measuring smaller. ooRight dorsal foot unfortunately has openings between the toes ooLeft foot superficial wound looks less aggravated. 08/07/18 ooButtock wounds continue to look better although some of her granulation and the larger medial wound. silver alginate ooLeft heel continues to look a lot better.silver collagen ooLeft foot superficial wound looks less stable. Requires debridement. He has a new wound superficial area on the foot on the lateral dorsal foot. ooRight foot looks better using silver alginate without Lotrisone 08/14/2018; patient was in the ER last week diagnosed with a UTI. He is now on Cefpodoxime and Macrodantin. ooButtock wounds continued to be smaller. Using silver alginate ooLeft heel continues to look better using silver collagen ooLeft foot superficial wound looks as though it is improving ooRight dorsal foot area is just about healed. 08/21/2018; patient is completed his antibiotics for his UTI. ooHe has 2 open areas on the buttocks. There is still not closed although the surface looks satisfactory. Using silver alginate ooLeft heel continues to improve using silver collagen ooThe bilateral dorsal foot areas which are at the base of his first and  second toes/possible tinea pedis are actually stable on the left but worse on the right. The area on the left required debridement of necrotic surface. After debridement I obtained a specimen for PCR culture. ooThe right dorsal foot which is been just about healed last week is now reopened 08/28/2018; culture done on the left dorsal foot  showed coag negative staph both staph epidermidis and Lugdunensis. I think this is worthwhile initiating systemic treatment. I will use doxycycline given his long list of allergies. The area on the left heel slightly improved but still requiring debridement. ooThe large wound on the buttock is just about closed whereas the smaller one is larger. Using silver alginate in this area 09/04/2018; patient is completing his doxycycline for the left foot although this continues to be a very difficult wound area with very adherent necrotic debris. We are using silver alginate to all his wounds right foot left foot and the small wounds on his buttock, silver collagen on the left heel. 09/11/2018; once again this patient has intense erythema and swelling of the left forefoot. Lesser degrees of erythema in the right foot. He has a long list of allergies and intolerances. I will reinstitute doxycycline. 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 Using silver alginate ooLeft heel also looks better using silver collagen ooUnfortunately both the areas on his feet look worse. The area on the left first second webspace is now gone through to the plantar part of his foot. The area on the left foot anteriorly is irritated with erythema and swelling in the forefoot. 09/25/2018 ooHis wound on the left plantar heel looks better. Using silver collagen ooThe area on the left buttock 2 small remnant areas. One is closed one is still open. Using silver alginate ooThe areas between both his first and second toes look worse. This in spite of long-standing antifungal therapy with ketoconazole and silver alginate which should have antifungal activity ooHe has small areas around his original wound on the left calf one is on the bottom of the original scar tissue and one superiorly both of these are small and superficial but again given wound history in this site this is  worrisome 10/02/2018 ooLeft plantar heel continues to gradually contract using silver collagen ooLeft buttock wound is unchanged using silver alginate ooThe areas on his dorsal feet between his first and second toes bilaterally look about the same. 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 Small open area here. We have been using silver alginate 10/09/2018; ooLeft plantar heel continues to look healthy and contract. No debridement is required ooLeft buttock slightly smaller with a tape injury wound just below which was new this week ooDorsal feet somewhat improved I have been using clindamycin ooLeft lateral looks lower extremity the actual open area looks worse although a lot of this is epithelialized. I am going to change to silver collagen today He has a lot more swelling in the right leg although this is not pitting not red and not particularly warm there is a lot of spasm in the right leg usually indicative of people with paralysis of some underlying discomfort. We have reviewed his vascular status from 2017 he had a left greater saphenous vein ablation. I wonder about referring him back to vascular surgery if the area on  the left leg continues to deteriorate. 10/16/2018 in today for follow-up and management of multiple lower extremity ulcers. His left Buttock wound is much lower smaller and almost closed completely. The wound to the left ankle has began to reopen with Epithelialization and some adherent slough. He has multiple new areas to the left foot and leg. The left dorsal foot without much improvement. Wound present between left great webspace and 2nd toe. Erythema and edema present right leg. Right LE ultrasound obtained on 10/10/18 was negative for DVT . 10/23/2018; ooLeft buttock is closed over.  Still dry macerated skin but there is no open wound. I suspect this is chronic pressure/moisture ooLeft lateral calf is quite a bit worse than when I saw this last. There is clearly drainage here he has macerated skin into the left plantar heel. We will change the primary dressing to alginate ooLeft dorsal foot has some improvement in overall wound area. Still using clindamycin and silver alginate ooRight dorsal foot about the same as the left using clindamycin and silver alginate ooThe erythema in the right leg has resolved. He is DVT rule out was negative ooLeft heel pressure area required debridement although the wound is smaller and the surface is health 10/26/2018 ooThe patient came back in for his nurse check today predominantly because of the drainage coming out of the left lateral leg with a recent reopening of his original wound on the left lateral calf. He comes in today with a large amount of surrounding erythema around the wound extending from the calf into the ankle and even in the area on the dorsal foot. He is not systemically unwell. He is not febrile. Nevertheless this looks like cellulitis. We have been using silver alginate to the area. I changed him to a regular visit and I am going to prescribe him doxycycline. The rationale here is a long list of medication intolerances and a history of MRSA. I did not see anything that I thought would provide a valuable culture 10/30/2018 ooFollow-up from his appointment 4 days ago with really an extensive area of cellulitis in the left calf left lateral ankle and left dorsal foot. I put him on doxycycline. He has a long list of medication allergies which are true allergy reactions. 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 In any case he is a lot better today. The erythema especially in the anterior and lateral left calf is better. He still has left ankle erythema. He  also is complaining about increasing edema in the right leg we have only been using Kerlix Coban and he has been doing the wraps at home. Finally he has a spotty rash on the medial part of his upper left calf which looks like folliculitis or perhaps wrap occlusion type injury. Small superficial macules not pustules 11/06/18 patient arrives today with again a considerable degree of erythema around the wound on the left lateral calf extending into the dorsal ankle and dorsal foot. This is a lot worse than when I saw this last week. He is on doxycycline really with not a lot of improvement. He has not been systemically unwell Wounds on the; left heel actually looks improved. Original area on the left foot and proximity to the first and second toes looks about the same. He has superficial areas on the dorsal foot, anterior calf and then the reopening of his original wound on the left lateral calf which looks about the same ooThe  only area he has on the right is the dorsal webspace first and second which is smaller. ooHe has a large area of dry erythematous skin on the left buttock small open area here. 11/13/2018; the patient arrives in much better condition. The erythema around the wound on the left lateral calf is a lot better. Not sure whether this was the clindamycin or the TCA and ketoconazole or just in the improvement in edema control [stasis dermatitis]. In any case this is a lot better. The area on the left heel is very small and just about resolved using silver collagen we have been using silver alginate to the areas on his dorsal feet 11/20/2018; his wounds include the left lateral calf, left heel, dorsal aspects of both feet just proximal to the first second webspace. He is stable to slightly improved. I did not think any changes to his dressings were going to be necessary 11/27/2018 he has a reopening on the left buttock which is surrounded by what looks like tinea or perhaps some other form of  dermatitis. The area on the left dorsal foot has some erythema around it I have marked this area but I am not sure whether this is cellulitis or not. Left heel is not closed. Left calf the reopening is really slightly longer and probably worse 1/13; in general things look better and smaller except for the left dorsal foot. Area on the left heel is just about closed, left buttock looks better only a small wound remains in the skin looks better [using Lotrisone] 1/20; the area on the left heel only has a few remaining open areas here. Left lateral calf about the same in terms of size, left dorsal foot slightly larger right lateral foot still not closed. The area on the left buttock has no open wound and the surrounding skin looks a lot better 1/27; the area on the left heel is closed. Left lateral calf better but still requiring extensive debridements. The area on his left buttock is closed. He still has the open areas on the left dorsal foot which is slightly smaller in the right foot which is slightly expanded. We have been using Iodoflex on these areas as well 2/3; left heel is closed. Left lateral calf still requiring debridement using Iodoflex there is no open area on his left buttock however he has dry scaly skin over a large area of this. Not really responding well to the Lotrisone. Finally the areas on his dorsal feet at the level of the first second webspace are slightly smaller on the right and about the same on the left. Both of these vigorously debrided with Anasept and gauze 2/10; left heel remains closed he has dry erythematous skin over the left buttock but there is no open wound here. Left lateral leg has come in and with. Still requiring debridement we have been using Iodoflex here. Finally the area on the left dorsal foot and right dorsal foot are really about the same extremely dry callused fissured areas. He does not yet have a dermatology appointment 2/17; left heel remains closed.  He has a new open area on the left buttock. The area on the left lateral calf is bigger longer and still covered in necrotic debris. No major change in his foot areas bilaterally. I am awaiting for a dermatologist to look on this. We have been using ketoconazole I do not know that this is been doing any good at all. 2/24; left heel remains closed. The left buttock  wound that was new reopening last week looks better. The left lateral calf appears better also although still requires debridement. The major area on his foot is the left first second also requiring debridement. We have been putting Prisma on all wounds. I do not believe that the ketoconazole has done too much good for his feet. He will use Lotrisone I am going to give him a 2-week course of terbinafine. We still do not have a dermatology appointment 3/2 left heel remains closed however there is skin over bone in this area I pointed this out to him today. The left buttock wound is epithelialized but still does not look completely stable. The area on the left leg required debridement were using silver collagen here. With regards to his feet we changed to Lotrisone last week and silver alginate. 3/9; left heel remains closed. Left buttock remains closed. The area on the right foot is essentially closed. The left foot remains unchanged. Slightly smaller on the left lateral calf. Using silver collagen to both of these areas 3/16-Left heel remains closed. Area on right foot is closed. Left lateral calf above the lateral malleolus open wound requiring debridement with easy bleeding. Left dorsal wound proximal to first toe also debrided. Left ischial area open new. Patient has been using Prisma with wrapping every 3 days. Dermatology appointment is apparently tomorrow.Patient has completed his terbinafine 2-week course with some apparent improvement according to him, there is still flaking and dry skin in his foot on the left 3/23; area on the  right foot is reopened. The area on the left anterior foot is about the same still a very necrotic adherent surface. He still has the area on the left leg and reopening is on the left buttock. He apparently saw dermatology although I do not have a note. According to the patient who is usually fairly well informed they did not have any good ideas. Put him on oral terbinafine which she is been on before. 3/30; using silver collagen to all wounds. Apparently his dermatologist put him on doxycycline and rifampin presumably some culture grew staph. I do not have this result. He remains on terbinafine although I have used terbinafine on him before 4/6; patient has had a fairly substantial reopening on the right foot between the first and second toes. He is finished his terbinafine and I believe is on doxycycline and rifampin still as prescribed by dermatology. We have been using silver collagen to all his wounds although the patient reports that he thinks silver alginate does better on the wounds on his buttock. 4/13; the area on his left lateral calf about the same size but it did not require debridement. ooLeft dorsal foot just proximal to the webspace between the first and second toes is about the same. Still nonviable surface. I note some superficial bronze discoloration of the dorsal part of his foot ooRight dorsal foot just proximal to the first and second toes also looks about the same. I still think there may be the same discoloration I noted above on the left ooLeft buttock wound looks about the same 4/20; left lateral calf appears to be gradually contracting using silver collagen. 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 The left dorsal foot wound is debrided of tightly adherent necrotic debris and really cleans up quite nicely. The right area is worse with expansion. I did not debride this it is now over the base of the second  toe ooThe area on  his left buttock is smaller no debridement is required using silver collagen 5/4; left calf continues to make good progress. ooHe arrives with erythema around the wounds on his dorsal foot which even extends to the plantar aspect. Very concerning for coexistent infection. He is finished the erythromycin I gave him for possible erythrasma this does not seem to have helped. ooThe area on the left foot is about the same base of the dorsal toes ooIs area on the buttock looks improved on the left 5/11; left calf and left buttock continued to make good progress. Left foot is about the same to slightly improved. ooMajor problem is on the right foot. He has not had an x-ray. Deep tissue culture I did last week showed both Enterobacter and E. coli. I did not change the doxycycline I put him on empirically although neither 1 of these were plated to doxycycline. He arrives today with the erythema looking worse on both the dorsal and plantar foot. Macerated skin on the bottom of the foot. he has not been systemically unwell 5/18-Patient returns at 1 week, left calf wound appears to be making some progress, left buttock wound appears slightly worse than last time, left foot wound looks slightly better, right foot redness is marginally better. X-ray of both feet show no air or evidence of osteomyelitis. Patient is finished his Omnicef and terbinafine. He continues to have macerated skin on the bottom of the left foot as well as right 5/26; left calf wound is better, left buttock wound appears to have multiple small superficial open areas with surrounding macerated skin. X-rays that I did last time showed no evidence of osteomyelitis in either foot. He is finished cefdinir and doxycycline. I do not think that he was on terbinafine. He continues to have a large superficial open area on the right foot anterior dorsal and slightly between the first and second toes. I did send him to dermatology  2 months ago or so wondering about whether they would do a fungal scraping. I do not believe they did but did do a culture. We have been using silver alginate to the toe areas, he has been using antifungals at home topically either ketoconazole or Lotrisone. We are using silver collagen on the left foot, silver alginate on the right, silver collagen on the left lateral leg and silver alginate on the left buttock 6/1; left buttock area is healed. We have the left dorsal foot, left lateral leg and right dorsal foot. We are using silver alginate to the areas on both feet and silver collagen to the area on his left lateral calf 6/8; the left buttock apparently reopened late last week. He is not really sure how this happened. He is tolerating the terbinafine. Using silver alginate to all wounds 6/15; left buttock wound is larger than last week but still superficial. ooCame in the clinic today with a report of purulence from the left lateral leg I did not identify any infection ooBoth areas on his dorsal feet appear to be better. He is tolerating the terbinafine. Using silver alginate to all wounds 6/22; left buttock is about the same this week, left calf quite a bit better. His left foot is about the same however he comes in with erythema and warmth in the right forefoot once again. Culture that I gave him in the beginning of May showed Enterobacter and E. coli. I gave him doxycycline and things seem to improve although neither 1 of these organisms was specifically plated. 6/29; left  buttock is larger and dry this week. Left lateral calf looks to me to be improved. Left dorsal foot also somewhat improved right foot completely unchanged. The erythema on the right foot is still present. He is completing the Ceftin dinner that I gave him empirically [see discussion above.) 7/6 - All wounds look to be stable and perhaps improved, the left buttock wound is slightly smaller, per patient bleeds easily,  completed ceftin, the right foot redness is less, he is on terbinafine 7/13; left buttock wound about the same perhaps slightly narrower. Area on the left lateral leg continues to narrow. Left dorsal foot slightly smaller right foot about the same. We are using silver alginate on the right foot and Hydrofera Blue to the areas on the left. Unna boot on the left 2 layer compression on the right 7/20; left buttock wound absolutely the same. Area on lateral leg continues to get better. Left dorsal foot require debridement as did the right no major change in the 7/27; left buttock wound the same size necrotic debris over the surface. The area on the lateral leg is closed once again. His left foot looks better right foot about the same although there is some involvement now of the posterior first second toe area. He is still on terbinafine which I have given him for a month, not certain a centimeter major change 06/25/19-All wounds appear to be slightly improved according to report, left buttock wound looks clean, both foot wounds have minimal to no debris the right dorsal foot has minimal slough. We are using Hydrofera Blue to the left and silver alginate to the right foot and ischial wound. 8/10-Wounds all appear to be around the same, the right forefoot distal part has some redness which was not there before, however the wound looks clean and small. Ischial wound looks about the same with no changes 8/17; his wound on the left lateral calf which was his original chronic venous insufficiency wound remains closed. Since I last saw him the areas on the left dorsal foot right dorsal foot generally appear better but require debridement. The area on his left initial tuberosity appears somewhat larger to me perhaps hyper granulated and bleeds very easily. We have been using Hydrofera Blue to the left dorsal foot and silver alginate to everything else 8/24; left lateral calf remains closed. The areas on his  dorsal feet on the webspace of the first and second toes bilaterally both look better. The area on the left buttock which is the pressure ulcer stage II slightly smaller. I change the dressing to Hydrofera Blue to all areas 8/31; left lateral calf remains closed. The area on his dorsal feet bilaterally look better. Using Hydrofera Blue. Still requiring debridement on the left foot. No change in the left buttock pressure ulcers however 9/14; left lateral calf remains closed. Dorsal feet look quite a bit better than 2 weeks ago. Flaking dry skin also a lot better with the ammonium lactate I gave him 2 weeks ago. The area on the left buttock is improved. He states that his Roho cushion developed a leak and he is getting a new one, in the interim he is offloading this vigorously 9/21; left calf remains closed. Left heel which was a possible DTI looks better this week. He had macerated tissue around the left dorsal foot right foot looks satisfactory and improved left buttock wound. I changed his dressings to his feet to silver alginate bilaterally. Continuing Hydrofera Blue on the left buttock. 9/28 left  calf remains closed. Left heel did not develop anything [possible DTI] dry flaking skin on the left dorsal foot. Right foot looks satisfactory. Improved left buttock wound. We are using silver alginate on his feet Hydrofera Blue on the buttock. I have asked him to go back to the Lotrisone on his feet including the wounds and surrounding areas 10/5; left calf remains closed. The areas on the left and right feet about the same. A lot of this is epithelialized however debris over the remaining open areas. He is using Lotrisone and silver alginate. The area on the left buttock using Hydrofera Blue 10/26. Patient has been out for 3 weeks secondary to Covid concerns. He tested negative but I think his wife tested positive. He comes in today with the left foot substantially worse, right foot about the same. Even  more concerning he states that the area on his left buttock closed over but then reopened and is considerably deeper in one aspect than it was before [stage III wound] 11/2; left foot really about the same as last week. Quarter sized wound on the dorsal foot just proximal to the first second toes. Surrounding erythema with areas of denuded epithelium. This is not really much different looking. Did not look like cellulitis this time however. ooRight foot area about the same.. We have been using silver alginate alginate on his toes ooLeft buttock still substantial irritated skin around the wound which I think looks somewhat better. We have been using Hydrofera Blue here. 11/9; left foot larger than last week and a very necrotic surface. Right foot I think is about the same perhaps slightly smaller. Debris around the circumference also addressed. Unfortunately on the left buttock there is been a decline. Satellite lesions below the major wound distally and now a an additional one posteriorly we have been using Hydrofera Blue but I think this is a pressure issue 11/16; left foot ulcer dorsally again a very adherent necrotic surface. Right foot is about the same. Not much change in the pressure ulcer on his left buttock. 11/30; left foot ulcer dorsally basically the same as when I saw him 2 weeks ago. Very adherent fibrinous debris on the wound surface. Patient reports a lot of drainage as well. 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 We have been using Iodoflex, patient changed back to alginate because of the drainage. Area on his right dorsal foot really looks benign with a healthier surface certainly a lot better than on the left. Left buttock wounds all improved using Hydrofera Blue 12/7; left dorsal foot again no improvement. Tightly adherent debris. PCR culture I did last week only showed likely skin contaminant. I have gone ahead and done a punch  biopsy of this which is about the last thing in terms of investigations I can think to do. He has known venous insufficiency and venous hypertension and this could be the issue here. The area on the right foot is about the same left buttock slightly worse according to our intake nurse secondary to [facility] Blue sticking to the wound 12/14; biopsy of the left foot that I did last time showed changes that could be related to wound healing/chronic stasis dermatitis phenomenon no neoplasm. We have been using silver alginate to both feet. I change the one on the left today to Sorbact and silver alginate to his other 2 wounds 12/28; the patient arrives with the following problems; ooMajor issue is the dorsal left foot which continues  to be a larger deeper wound area. Still with a completely nonviable surface ooParadoxically the area mirror image on the right on the right dorsal foot appears to be getting better. ooHe had some loss of dry denuded skin from the lower part of his original wound on the left lateral calf. Some of this area looked a little vulnerable and for this reason we put him in wrap that on this side this week ooThe area on his left buttock is larger. He still has the erythematous circular area which I think is a combination of pressure, sweat. This does not look like cellulitis or fungal dermatitis 11/26/2019; -Dorsal left foot large open wound with depth. Still debris over the surface. Using Sorbact ooThe area on the dorsal right foot paradoxically has closed over [facility] has a reopening on the left ankle laterally at the base of his original wound that extended up into the calf. This appears clean. ooThe left buttock wound is smaller but with very adherent necrotic debris over the surface. We have been using silver alginate here as well The patient had arterial studies done in 2017. He had biphasic waveforms at the dorsalis pedis and posterior tibial bilaterally. ABI in the left was  1.17. Digit waveforms were dampened. He has slight spasticity in the great toes I do not think a TBI would be possible 1/11; the patient comes in today with a sizable reopening between the first and second toes on the right. This is not exactly in the same location where we have been treating wounds previously. According to our intake nurse this was actually fairly deep but 0.6 cm. The area on the left dorsal foot looks about the same the surface is somewhat cleaner using Sorbact, his MRI is in 2 days. We have not managed yet to get arterial studies. The new reopening on the left lateral calf looks somewhat better using alginate. The left buttock wound is about the same using alginate 1/18; the patient had his ARTERIAL studies which were quite normal. ABI in the right at 1.13 with triphasic/biphasic waveforms on the left ABI 1.06 again with triphasic/biphasic waveforms. It would not have been possible to have done a toe brachial index because of spasticity. We have been using Sorbac to the left foot alginate to the rest of his wounds on the right foot left lateral calf and left buttock 1/25; arrives in clinic with erythema and swelling of the left forefoot worse over the first MTP area. This extends laterally dorsally and but also posteriorly. Still has an area on the left lateral part of the lower part of his calf wound it is eschared and clearly not closed. ooArea on the left buttock still with surrounding irritation and erythema. ooRight foot surface wound dorsally. The area between the right and first and second toes appears better. 2/1; ooThe left foot wound is about the same. Erythema slightly better I gave him a week of doxycycline empirically ooRight foot wound is more extensive extending between the toes to the plantar surface ooLeft lateral calf really no open surface on the inferior part of his original wound however the entire area still looks vulnerable ooAbsolutely no improvement  in the left buttock wound required debridement. 2/8; the left foot is about the same. Erythema is slightly improved I gave him clindamycin last week. ooRight foot looks better he is using Lotrimin and silver alginate ooHe has a breakdown in the left lateral calf. Denuded epithelium which I have removed ooLeft buttock about the same  were using Hydrofera Blue 2/15; left foot is about the same there is less surrounding erythema. Surface still has tightly adherent debris which I have debriding however not making any progress ooRight foot has a substantial wound on the medial right second toe between the first and second webspace. ooStill an open area on the left lateral calf distal area. ooButtock wound is about the same 2/22; left foot is about the same less surrounding erythema. Surface has adherent debris. Polymen Ag Right foot area significant wound between the first and second toes. We have been using silver alginate here Left lateral leg polymen Ag at the base of his original venous insufficiency wound ooLeft buttock some improvement here 3/1; ooRight foot is deteriorating in the first second toe webspace. Larger and more substantial. We have been using silver alginate. ooLeft dorsal foot about the same markedly adherent surface debris using PolyMem Ag ooLeft lateral calf surface debris using PolyMem AG ooLeft buttock is improved again using PolyMem Ag. ooHe is completing his terbinafine. The erythema in the foot seems better. He has been on this for 2 weeks 3/8; no improvement in any wound area in fact he has a small open area on the dorsal midfoot which is new this week. He has not gotten his foot x-rays yet 3/15; his x-rays were both negative for osteomyelitis of both feet. No major change in any of his wounds on the extremities however his buttock wounds are better. We have been using polymen on the buttocks, left lower leg. Iodoflex on the left foot and silver alginate on the  right 3/22; arrives in clinic today with the 2 major issues are the improvement in the left dorsal foot wound which for once actually looks healthy with a nice healthy wound surface without debridement. Using Iodoflex here. Unfortunately on the left lateral calf which is in the distal part of his original wound he came to the clinic here for there was purulent drainage noted some increased breakdown scattered around the original area and a small area proximally. We we are using polymen here will change to silver alginate today. His buttock wound on the left is better and I think the area on the right first second toe webspace is also improved 3/29; left dorsal foot looks better. Using Iodoflex. Left ankle culture from deterioration last time grew E. coli, Enterobacter and Enterococcus. I will give him a course of cefdinir although that will not cover Enterococcus. The area on the right foot in the webspace of the first and second toe lateral first toe looks better. The area on his buttock is about healed Vascular appointment is on April 21. This is to look at his venous system vis--vis continued breakdown of the wounds on the left including the left lateral leg and left dorsal foot he. He has had previous ablations on this side 4/5; the area between the right first and second toes lateral aspect of the first toe looks better. Dorsal aspect of the left first toe on the left foot also improved. Unfortunately the left lateral lower leg is larger and there is a second satellite wound superiorly. The usual superficial abrasions on the left buttock overall better but certainly not closed 4/12; the area between the right first and second toes is improved. Dorsal aspect of the left foot also slightly smaller with a vibrant healthy looking surface. No real change in the left lateral leg and the left buttock wound is healed He has an unaffordable co-pay for Apligraf. Appointment with  vein and vascular with  regards to the left leg venous part of the circulation is on 4/21 4/19; we continue to see improvement in all wound areas. Although this is minor. He has his vascular appointment on 4/21. The area on the left buttock has not reopened although right in the center of this area the skin looks somewhat threatened 4/26; the left buttock is unfortunately reopened. In general his left dorsal foot has a healthy surface and looks somewhat smaller although it was not measured as such. The area between his first and second toe webspace on the right as a small wound against the first toe. The patient saw vascular surgery. The real question I was asking was about the small saphenous vein on the left. He has previously ablated left greater saphenous vein. Nothing further was commented on on the left. Right greater saphenous vein without reflux at the saphenofemoral junction or proximal thigh there was no indication for ablation of the right greater saphenous vein duplex was negative for DVT bilaterally. They did not think there was anything from a vascular surgery point of view that could be offered. They ABIs within normal limits 5/3; only small open area on the left buttock. The area on the left lateral leg which was his original venous reflux is now 2 wounds both which look clean. We are using Iodoflex on the left dorsal foot which looks healthy and smaller. He is down to a very tiny area between the right first and second toes, using silver alginate 5/10; all of his wounds appear better. We have much better edema control in 4 layer compression on the left. This may be the factor that is allowing the left foot and left lateral calf to heal. He has external compression garments at home 04/14/20-All of his wounds are progressing well, the left forefoot is practically closed, left ischium appears to be about the same, right toe webspace is also smaller. The left lateral leg is about the same, continue using  Hydrofera Blue to this, silver alginate to the ischium, Iodoflex to the toe space on the right 6/7; most of his wounds outside of the left buttock are doing well. The area on the left lateral calf and left dorsal foot are smaller. The area on the right foot in between the first and second toe webspace is barely visible although he still says there is some drainage here is the only reason I did not heal this out. ooUnfortunately the area on the left buttock almost looks like he has a skin tear from tape. He has open wound and then a large flap of skin that we are trying to get adherence over an area just next to the remaining wound 6/21; 2 week follow-up. I believe is been here for nurse visits. Miraculously the area between his first and second toes on the left dorsal foot is closed over. Still open on the right first second web space. The left lateral calf has 2 open areas. Distally this is more superficial. The proximal area had a little more depth and required debridement of adherent necrotic material. His buttock wound is actually larger we have been using silver alginate here 6/28; the patient's area on the left foot remains closed. Still open wet area between the first and second toes on the right and also extending into the plantar aspect. We have been using silver alginate in this location. He has 2 areas on the left lower leg part of his original long wounds which  I think are better. We have been using Hydrofera Blue here. Hydrofera Blue to the left buttock which is stable 7/12; left foot remains closed. Left ankle is closed. May be a small area between his right first and second toes the only truly open area is on the left buttock. We have been using Hydrofera Blue here 7/19; patient arrives with marked deterioration especially in the left foot and ankle. We did not put him in a compression wrap on the left last week in fact he wore his juxta lite stockings on either side although he does  not have an underlying stocking. He has a reopening on the left dorsal foot, left lateral ankle and a new area on the right dorsal ankle. More worrisome is the degree of erythema on the left foot extending on the lateral foot into the lateral lower leg on the left 7/26; the patient had erythema and drainage from the lateral left ankle last week. Culture of this grew MRSA resistant to doxycycline and clindamycin which are the 2 antibiotics we usually use with this patient who has multiple antibiotic allergies including linezolid, trimethoprim sulfamethoxazole. I had give him an empiric doxycycline and he comes in the area certainly looks somewhat better although it is blotchy in his lower leg. He has not been systemically unwell. He has had areas on the left dorsal foot which is a reopening, chronic wounds on the left lateral ankle. Both of these I think are secondary to chronic venous insufficiency. The area between his first and second toes is closed as far as I can tell. He had a new wrap injury on the right dorsal ankle last week. Finally he has an area on the left buttock. We have been using silver alginate to everything except the left buttock we are using Hydrofera Blue 06/30/20-Patient returns at 1 week, has been given a sample dose pack of NUZYRA which is a tetracycline derivative [omadacycline], patient has completed those, we have been using silver alginate to almost all the wounds except the left ischium where we are using Hydrofera Blue all of them look better 8/16; since I last saw the patient he has been doing well. The area on the left buttock, left lateral ankle and left foot are all closed today. He has completed the [place] I gave him last time and tolerated this well. He still has open areas on the right dorsal ankle and in the right first second toe area which we are using silver alginate. 8/23; we put him in his bilateral external compression stockings last week as he did not have  anything open on either leg except for concerning area between the right first and second toe. He comes in today with an area on the left dorsal foot slightly more proximal than the original wound, the left lateral foot but this is actually a continuation of the area he had on the left lateral ankle from last time. As well he is opened up on the left buttock again. 8/30; comes in today with things looking a lot better. The area on the left lower ankle has closed down as has the left foot but with eschar in both areas. The area on the dorsal right ankle is also epithelialized. Very little remaining of the left buttock wound. We have been using silver alginate on all wound areas 9/13; the area in the first second toe webspace on the right has fully epithelialized. He still has some vulnerable epithelium on the right and the ankle  and the dorsal foot. He notes weeping. He is using his juxta lite stocking. On the left again the left dorsal foot is closed left lateral ankle is closed. We went to the juxta lite stocking here as well. ooStill vulnerable in the left buttock although only 2 small open areas remain here 9/27; 2-week follow-up. We did not look at his left leg but the patient says everything is closed. He is a bit disturbed by the amount of edema in his left foot he is using juxta lite stockings but asking about over the toes stockings which would be 30/40, will talk to him next time. According to him there is no open wound on either the left foot or the left ankle/calf He has an open area on the dorsal right calf which I initially point a wrap injury. He has superficial remaining wound on the left ischial tuberosity been using silver alginate although he says this sticks to the wound 10/5; we gave him 2-week follow-up but he called yesterday expressing some concerns about his right foot right ankle and the left buttock. He came in early. There is still no open areas on the left leg and that  still in his juxta lite stocking Objective Constitutional Patient is hypertensive.. Pulse regular and within target range for patient.Marland Kitchen Respirations regular, non-labored and within target range.. Temperature is normal and within the target range for the patient.Marland Kitchen Appears in no distress. Vitals Time Taken: 3:03 PM, Height: 70 in, Source: Stated, Weight: 216 lbs, Source: Stated, BMI: 31, Temperature: 98.4 F, Pulse: 108 bpm, Respiratory Rate: 18 breaths/min, Blood Pressure: 150/77 mmHg. General Notes: Wound exam; ooLeft buttock actually this looks really good. There is still smattering some small open areas however the skin around this the really looks quite good there apparently using some combination of zinc oxide and calamine. I like to know what this is ooThe other problem is on the right dorsal foot and right dorsal ankle. Once again the dorsal foot has broken down with an open area with it it very adherent necrotic surface. There is surrounding erythema. Similar area on the ankle crease on the right as well Integumentary (Hair, Skin) Wound #38R status is Open. Original cause of wound was Gradually Appeared. The wound is located on the Right T - Web between 1st and 2nd. The wound oe measures 4cm length x 4cm width x 0.2cm depth; 12.566cm^2 area and 2.513cm^3 volume. There is Fat Layer (Subcutaneous Tissue) exposed. There is no tunneling or undermining noted. There is a medium amount of serous drainage noted. The wound margin is distinct with the outline attached to the wound base. There is medium (34-66%) pink granulation within the wound bed. There is a medium (34-66%) amount of necrotic tissue within the wound bed including Adherent Slough. General Notes: red periwoiund Wound #41R status is Open. Original cause of wound was Gradually Appeared. The wound is located on the Left Ischium. The wound measures 2.5cm length x 2.1cm width x 0.1cm depth; 4.123cm^2 area and 0.412cm^3 volume. There  is Fat Layer (Subcutaneous Tissue) exposed. There is no tunneling or undermining noted. There is a medium amount of serosanguineous drainage noted. The wound margin is distinct with the outline attached to the wound base. There is large (67-100%) red, friable granulation within the wound bed. There is a small (1-33%) amount of necrotic tissue within the wound bed including Adherent Slough. Wound #46 status is Open. Original cause of wound was Gradually Appeared. The wound is located on the Right,Dorsal  Ankle. The wound measures 0.2cm length x 0.2cm width x 0.1cm depth; 0.031cm^2 area and 0.003cm^3 volume. There is Fat Layer (Subcutaneous Tissue) exposed. There is no tunneling or undermining noted. There is a small amount of serosanguineous drainage noted. The wound margin is flat and intact. There is large (67-100%) red granulation within the wound bed. There is no necrotic tissue within the wound bed. Assessment Active Problems ICD-10 Chronic venous hypertension (idiopathic) with ulcer and inflammation of left lower extremity 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 to breakdown of skin Non-pressure chronic ulcer of right ankle limited to breakdown of skin Non-pressure chronic ulcer of other part of right foot limited to breakdown of skin Non-pressure chronic ulcer of other part of left foot limited to breakdown of skin Pressure ulcer of left buttock, stage 3 Paraplegia, complete Procedures Wound #38R Pre-procedure diagnosis of Wound #38R is an Inflammatory located on the Right T - Web between 1st and 2nd . There was a Selective/Open Wound oe Skin/Epidermis Debridement with a total area of 16 sq cm performed by Ricard Dillon., MD. With the following instrument(s): Curette to remove Viable and Non-Viable tissue/material. Material removed includes Slough, Skin: Dermis, and Skin: Epidermis. No specimens were taken. A time out was conducted at 13:52, prior to the start of the  procedure. A Moderate amount of bleeding was controlled with Pressure. The procedure was tolerated well with a pain level of 0 throughout and a pain level of 0 following the procedure. Post Debridement Measurements: 4cm length x 4cm width x 0.2cm depth; 2.513cm^3 volume. Character of Wound/Ulcer Post Debridement is improved. Post procedure Diagnosis Wound #38R: Same as Pre-Procedure Plan Follow-up Appointments: Return Appointment in 1 week. Dressing Change Frequency: Wound #38R Right T - Web between 1st and 2nd: oe Change Dressing every other day. Wound #41R Left Ischium: Change Dressing every other day. Wound #46 Right,Dorsal Ankle: Change Dressing every other day. Skin Barriers/Peri-Wound Care: Moisturizing lotion - both legs daily Other: - lotrimen to periwound of right foot webbing 1st / 2nd toe Wound Cleansing: May shower and wash wound with soap and water. - on days that dressing is changed Primary Wound Dressing: Wound #38R Right T - Web between 1st and 2nd: oe Calcium Alginate with Silver Wound #41R Left Ischium: Calcium Alginate with Silver Wound #46 Right,Dorsal Ankle: Calcium Alginate with Silver Secondary Dressing: Wound #38R Right T - Web between 1st and 2nd: oe Kerlix/Rolled Gauze Dry Gauze Wound #41R Left Ischium: Foam Border - or ABD pad and tape Wound #46 Right,Dorsal Ankle: Foam Border Edema Control: Elevate legs to the level of the heart or above for 30 minutes daily and/or when sitting, a frequency of: - throughout the day Support Garment 30-40 mm/Hg pressure to: - Juxtalite to both legs daily Off-Loading: Low air-loss mattress (Group 2) Roho cushion for wheelchair Turn and reposition every 2 hours - out of wheelchair throughout the day, try to lay on sides, sleep in the bed not the recliner #1 I continued with silver alginate to the right dorsal ankle and right dorsal foot. I asked him to a probably of Lotrisone cream to the skin under the area. This  is dry scaly and flaking. I did some thoughts that this could be tinea although we have been down this road before. 2. I really was not concerned about the left buttock. In fact the skin around this area looked as good as I have seen it in quite some time 3. I will follow him up next  week. Nothing major different although the real opening on the right foot and ankle is hard for me to understand Electronic Signature(s) Signed: 08/26/2020 5:35:49 PM By: Linton Ham MD Entered By: Linton Ham on 08/26/2020 17:33:08 -------------------------------------------------------------------------------- SuperBill Details Patient Name: Date of Service: Barren, [name] 08/26/2020 Medical Record Number: [id number] Patient Account Number: [id number] Date of Birth/Sex: Treating RN: December 11, 1985 (34 y.o. 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 to breakdown of skin L97.511 Non-pressure chronic ulcer of other part of right foot limited to breakdown of skin L97.521 Non-pressure chronic ulcer of other part of left foot limited to breakdown of 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

## 2020-08-26 NOTE — Progress Notes (Signed)
[name], [name] ([id number]) Visit Report for 08/26/2020 Arrival Information Details Patient Name: Date of Service: [name], [name] Side 08/26/2020 2:30 PM Medical Record Number: [id number] Patient Account Number: [id number] Date of Birth/Sex: Treating RN: Mar 12, 1986 (34 y.o. [name] Primary Care Franciszek Platten: [place], [place] Other Clinician: Referring Oakes Mccready: Treating Easter [name]/Extender: [name]: 27 Visit Information History Since Last Visit Added or deleted any medications: No Patient Arrived: Wheel Chair Any new allergies or adverse reactions: No Arrival Time: 15:02 Had [name] fall or experienced change in No Accompanied By: self activities of daily living that may affect Transfer Assistance: None risk of falls: Patient Identification Verified: Yes Signs or symptoms of abuse/neglect since last visito No Secondary Verification Process Completed: Yes Hospitalized since last visit: No Patient Requires Transmission-Based Precautions: No Implantable device outside of the clinic excluding No Patient Has Alerts: Yes cellular tissue based products placed in the center Patient Alerts: R ABI = 1.0 since last visit: L ABI = 1.1 Has Dressing in Place as Prescribed: Yes Has Compression in Place as Prescribed: Yes Pain Present Now: No Electronic Signature(s) Signed: 08/26/2020 5:49:38 PM By: Baruch Gouty RN, BSN Entered By: Baruch Gouty on 08/26/2020 15:03:01 -------------------------------------------------------------------------------- Encounter Discharge Information Details Patient Name: Date of Service: [name], [name] [name] 08/26/2020 2:30 PM Medical Record Number: [id number] Patient Account Number: [id number] Date of Birth/Sex: Treating RN: [birth year] (34 y.o. [name] Primary Care Adaleigh Warf: [place], [place] Other Clinician: Referring Owens Hara: Treating Samay Delcarlo/Extender: [name]: [phone] Encounter  Discharge Information Items Post Procedure Vitals Discharge Condition: Stable Temperature (F): 98.4 Ambulatory Status: Wheelchair Pulse (bpm): 108 Discharge Destination: Home Respiratory Rate (breaths/min): 18 Transportation: Other Blood Pressure (mmHg): 150/77 Accompanied By: self Schedule Follow-up Appointment: Yes Clinical Summary of Care: Patient Declined Electronic Signature(s) Signed: 08/26/2020 5:16:50 PM By: Kela Millin Entered By: Kela Millin on 08/26/2020 16:10:12 -------------------------------------------------------------------------------- Lower Extremity Assessment Details Patient Name: Date of Service: [name], [name] [name] 08/26/2020 2:30 PM Medical Record Number: [id number] Patient Account Number: [id number] Date of Birth/Sex: Treating RN: 1985/12/28 (34 y.o. [name] Primary Care Maiya Kates: [place], [place] Other Clinician: Referring Kilo Eshelman: Treating Sophiana Milanese/Extender: [name]: 242 Edema Assessment Assessed: [Left: No] [Right: No] Edema: [Left: Ye] [Right: s] Calf Left: Right: Point of Measurement: 33 cm From Medial Instep 31.7 cm Ankle Left: Right: Point of Measurement: 10 cm From Medial Instep 23.3 cm Vascular Assessment Pulses: Dorsalis Pedis Palpable: [Right:Yes] Electronic Signature(s) Signed: 08/26/2020 5:49:38 PM By: Baruch Gouty RN, BSN Entered By: Baruch Gouty on 08/26/2020 15:06:10 -------------------------------------------------------------------------------- Multi Wound Chart Details Patient Name: Date of Service: [name], [name] [name] 08/26/2020 2:30 PM Medical Record Number: [id number] Patient Account Number: [id number] Date of Birth/Sex: Treating RN: Dec 22, 1985 (34 y.o. [name] Primary Care Ardell Aaronson: [name], [name] Other Clinician: Referring Tyauna [name]: Treating Jariana Shumard/Extender: [name]: 242 Vital Signs Height(in):  70 Pulse(bpm): 108 Weight(lbs): 216 Blood Pressure(mmHg): 150/77 Body Mass Index(BMI): 31 Temperature(F): 98.4 Respiratory Rate(breaths/min): 18 Photos: [38R:No Photos Right T - Web between 1st and 2nd Left Ischium oe] [41R:No Photos] [46:No Photos Right, Dorsal Ankle] Wound Location: [38R:Gradually Appeared] [41R:Gradually Appeared] [46:Gradually Appeared] Wounding Event: [38R:Inflammatory] [41R:Pressure Ulcer] [46:Abrasion] Primary Etiology: [38R:Sleep Apnea, Hypertension, Paraplegia Sleep Apnea, Hypertension, Paraplegia Sleep Apnea, Hypertension, Paraplegia] Comorbid History: [38R:11/30/2019] [41R:03/16/2020] [46:08/13/2020] Date Acquired: [38R:38] [41R:23] [46:1] Weeks of Treatment: [38R:Open] [41R:Open] [46:Open] Wound Status: [38R:Yes] [41R:Yes] [46:No] Wound Recurrence: [38R:No] [41R:Yes] [46:No] Clustered Wound: [38R:[name]] [41R:3] [46:[name]] Clustered  Quantity: [38R:4x4x0.2] [41R:2.5x2.1x0.1] [46:0.2x0.2x0.1] Measurements L x W x D (cm) [38R:12.566] [41R:4.123] [46:0.031] [name] (cm) : rea [38R:2.513] [41R:0.412] [46:0.003] Volume (cm) : [38R:-3707.90%] [41R:-128.30%] [46:50.80%] % Reduction in Area: [38R:-987.90%] [41R:-127.60%] [46:50.00%] % Reduction in Volume: [38R:Full Thickness Without Exposed] [41R:Category/Stage II] [46:Full Thickness Without Exposed] Classification: [38R:Support Structures Medium] [41R:Medium] [46:Support Structures Small] Exudate [name] mount: [38R:Serous] [41R:Serosanguineous] [46:Serosanguineous] Exudate Type: [38R:amber] [41R:red, brown] [46:red, brown] Exudate Color: [38R:Distinct, outline attached] [41R:Distinct, outline attached] [46:Flat and Intact] Wound Margin: [38R:Medium (34-66%)] [41R:Large (67-100%)] [46:Large (67-100%)] Granulation [name] mount: [38R:Pink] [41R:Red, Friable] [46:Red] Granulation Quality: [38R:Medium (34-66%)] [41R:Small (1-33%)] [46:None Present (0%)] Necrotic [name] mount: [38R:Fat Layer (Subcutaneous Tissue): Yes Fat Layer (Subcutaneous  Tissue): Yes Fat Layer (Subcutaneous Tissue): Yes] Exposed Structures: [38R:Fascia: No Tendon: No Muscle: No Joint: No Bone: No Small (1-33%)] [41R:Fascia: No Tendon: No Muscle: No Joint: No Bone: No Medium (34-66%)] [46:Fascia: No Tendon: No Muscle: No Joint: No Bone: No Medium (34-66%)] Epithelialization: [38R:Debridement - Selective/Open Wound [name]] [46:[name]] Debridement: Pre-procedure Verification/Time Out 13:52 [41R:[name]] [46:[name]] Taken: [38R:Slough] [41R:[name]] [46:[name]] Tissue Debrided: [38R:Skin/Epidermis] [41R:[name]] [46:[name]] Level: [38R:16] [41R:[name]] [46:[name]] Debridement [name] (sq cm): [38R:rea Curette] [41R:[name]] [46:[name]] Instrument: [38R:Moderate] [41R:[name]] [46:[name]] Bleeding: [38R:Pressure] [41R:[name]] [46:[name]] Hemostasis [name] chieved: [38R:0] [41R:[name]] [46:[name]] Procedural Pain: [38R:0] [41R:[name]] [46:[name]] Post Procedural Pain: [38R:Procedure was tolerated well] [41R:[name]] [46:[name]] Debridement Treatment Response: [38R:4x4x0.2] [41R:[name]] [46:[name]] Post Debridement Measurements L x W x D (cm) [38R:2.513] [41R:[name]] [46:[name]] Post Debridement Volume: (cm) [38R:red periwoiund] [41R:[name]] [46:[name]] Assessment Notes: [38R:Debridement] [41R:[name]] [46:[name]] Treatment Notes Wound #38R (Right Toe - Web between 1st and 2nd) 1. Cleanse With Wound Cleanser 2. Periwound Care Antifungal cream 3. Primary Dressing Applied Calcium Alginate Ag 4. Secondary Dressing Foam Border Dressing Notes juxtalite Wound #41R (Left Ischium) 1. Cleanse With Wound Cleanser 3. Primary Dressing Applied Calcium Alginate Ag 4. Secondary Dressing Foam Border Dressing Wound #46 (Right, Dorsal Ankle) 1. Cleanse With Wound Cleanser 3. Primary Dressing Applied Calcium Alginate Ag 4. Secondary Dressing Foam Border Dressing Electronic Signature(s) Signed: 08/26/2020 5:35:40 PM By: Carlene Coria RN Signed: 08/26/2020 5:35:49 PM By: Linton Ham MD Signed: 08/26/2020 5:35:49 PM By: Linton Ham MD Entered By: Linton Ham  on 08/26/2020 17:21:56 -------------------------------------------------------------------------------- Multi-Disciplinary Care Plan Details Patient Name: Date of Service: [name], [name] [name] 08/26/2020 2:30 PM Medical Record Number: [id number] Patient Account Number: [id number] Date of Birth/Sex: Treating RN: 1986/10/01 (34 y.o. [name] Primary Care Jerine Surles: [name], [name] Other Clinician: Referring Annaleah Arata: Treating Biannca Scantlin/Extender: [name]: 242 Active Inactive Wound/Skin Impairment Nursing Diagnoses: Impaired tissue integrity Knowledge deficit related to ulceration/compromised skin integrity Goals: Patient/caregiver will verbalize understanding of skin care regimen Date Initiated: 01/05/2016 Target Resolution Date: 09/05/2020 Goal Status: Active Ulcer/skin breakdown will have [name] volume reduction of 30% by week 4 Date Initiated: 01/05/2016 Date Inactivated: 12/22/2017 Target Resolution Date: 01/19/2018 Unmet Reason: complex wounds, Goal Status: Unmet infection Interventions: Assess patient/caregiver ability to obtain necessary supplies Assess ulceration(s) every visit Provide education on ulcer and skin care Notes: Electronic Signature(s) Signed: 08/26/2020 5:35:40 PM By: Carlene Coria RN Entered By: Carlene Coria on 08/26/2020 15:02:22 -------------------------------------------------------------------------------- Pain Assessment Details Patient Name: Date of Service: Earp, [name] [name] 08/26/2020 2:30 PM Medical Record Number: [id number] Patient Account Number: [id number] Date of Birth/Sex: Treating RN: April 30, 1986 (34 y.o. [name] Primary Care Philena Obey: [place], [place] Other Clinician: Referring Tiffiany Beadles: Treating Brylyn Novakovich/Extender: [name]: 242 Active Problems Location of Pain Severity and Description of Pain Patient Has Paino No Site Locations Rate the  pain. Rate the  pain. Current Pain Level: 0 Pain Management and Medication Current Pain Management: Electronic Signature(s) Signed: 08/26/2020 5:49:38 PM By: Baruch Gouty RN, BSN Entered By: Baruch Gouty on 08/26/2020 15:05:18 -------------------------------------------------------------------------------- Patient/Caregiver Education Details Patient Name: Date of Service: [name], [name] Side 10/5/2021andnbsp2:30 PM Medical Record Number: [id number] Patient Account Number: [id number] Date of Birth/Gender: Treating RN: 01-28-1986 (33 y.o. [name] Primary Care Physician: Janine Limbo Other Clinician: Referring Physician: Treating Physician/Extender: [name]: 43 Education Assessment Education Provided To: Patient Education Topics Provided Wound/Skin Impairment: Methods: Explain/Verbal Responses: State content correctly Electronic Signature(s) Signed: 08/26/2020 5:35:40 PM By: Carlene Coria RN Entered By: Carlene Coria on 08/26/2020 15:03:04 -------------------------------------------------------------------------------- Wound Assessment Details Patient Name: Date of Service: [name], [name] [name] 08/26/2020 2:30 PM Medical Record Number: [id number] Patient Account Number: [id number] Date of Birth/Sex: Treating RN: [birth year] (34 y.o. [name] Primary Care Kekoa Fyock: [name], [name] Other Clinician: Referring Sofiya Ezelle: Treating Kynzli Rease/Extender: [name]: 242 Wound Status Wound Number: 38R Primary Etiology: Inflammatory Wound Location: Right T - Web between 1st and 2nd oe Wound Status: Open Wounding Event: Gradually Appeared Comorbid History: Sleep Apnea, Hypertension, Paraplegia Date Acquired: 11/30/2019 Weeks Of Treatment: 38 Clustered Wound: No Wound Measurements Length: (cm) 4 Width: (cm) 4 Depth: (cm) 0.2 Area: (cm) 12.566 Volume: (cm) 2.513 % Reduction in Area: -3707.9% % Reduction  in Volume: -987.9% Epithelialization: Small (1-33%) Tunneling: No Undermining: No Wound Description Classification: Full Thickness Without Exposed Support Structures Wound Margin: Distinct, outline attached Exudate Amount: Medium Exudate Type: Serous Exudate Color: amber Foul Odor After Cleansing: No Slough/Fibrino No Wound Bed Granulation Amount: Medium (34-66%) Exposed Structure Granulation Quality: Pink Fascia Exposed: No Necrotic Amount: Medium (34-66%) Fat Layer (Subcutaneous Tissue) Exposed: Yes Necrotic Quality: Adherent Slough Tendon Exposed: No Muscle Exposed: No Joint Exposed: No Bone Exposed: No Assessment Notes red periwoiund Treatment Notes Wound #38R (Right Toe - Web between 1st and 2nd) 1. Cleanse With Wound Cleanser 2. Periwound Care Antifungal cream 3. Primary Dressing Applied Calcium Alginate Ag 4. Secondary Dressing Foam Border Dressing Notes juxtalite Electronic Signature(s) Signed: 08/26/2020 5:49:38 PM By: Baruch Gouty RN, BSN Entered By: Baruch Gouty on 08/26/2020 15:10:46 -------------------------------------------------------------------------------- Wound Assessment Details Patient Name: Date of Service: [name], [name] [name] 08/26/2020 2:30 PM Medical Record Number: [id number] Patient Account Number: [id number] Date of Birth/Sex: Treating RN: [birth year] (34 y.o. [name] Primary Care Kacper Cartlidge: [name], [name] Other Clinician: Referring Armstead Heiland: Treating Dam Ashraf/Extender: [name]: 242 Wound Status Wound Number: 41R Primary Etiology: Pressure Ulcer Wound Location: Left Ischium Wound Status: Open Wounding Event: Gradually Appeared Comorbid History: Sleep Apnea, Hypertension, Paraplegia Date Acquired: 03/16/2020 Weeks Of Treatment: 23 Clustered Wound: Yes Wound Measurements Length: (cm) 2.5 Width: (cm) 2.1 Depth: (cm) 0.1 Clustered Quantity: 3 Area: (cm) 4.123 Volume: (cm)  0.412 % Reduction in Area: -128.3% % Reduction in Volume: -127.6% Epithelialization: Medium (34-66%) Tunneling: No Undermining: No Wound Description Classification: Category/Stage II Wound Margin: Distinct, outline attached Exudate Amount: Medium Exudate Type: Serosanguineous Exudate Color: red, brown Foul Odor After Cleansing: No Slough/Fibrino Yes Wound Bed Granulation Amount: Large (67-100%) Exposed Structure Granulation Quality: Red, Friable Fascia Exposed: No Necrotic Amount: Small (1-33%) Fat Layer (Subcutaneous Tissue) Exposed: Yes Necrotic Quality: Adherent Slough Tendon Exposed: No Muscle Exposed: No Joint Exposed: No Bone Exposed: No Treatment Notes Wound #41R (Left Ischium) 1. Cleanse With Wound Cleanser 3. Primary Dressing Applied Calcium Alginate Ag 4. Secondary Dressing Foam Border  Dressing Electronic Signature(s) Signed: 08/26/2020 5:49:38 PM By: Baruch Gouty RN, BSN Entered By: Baruch Gouty on 08/26/2020 15:13:45 -------------------------------------------------------------------------------- Wound Assessment Details Patient Name: Date of Service: [name], [name] [name] 08/26/2020 2:30 PM Medical Record Number: [id number] Patient Account Number: [id number] Date of Birth/Sex: Treating RN: Jan 27, 1986 (34 y.o. [name] Primary Care Tehya Leath: [name], [name] Other Clinician: Referring Chenille Toor: Treating Keaden Gunnoe/Extender: [name]: 242 Wound Status Wound Number: 46 Primary Etiology: Abrasion Wound Location: Right, Dorsal Ankle Wound Status: Open Wounding Event: Gradually Appeared Comorbid History: Sleep Apnea, Hypertension, Paraplegia Date Acquired: 08/13/2020 Weeks Of Treatment: 1 Clustered Wound: No Wound Measurements Length: (cm) 0.2 Width: (cm) 0.2 Depth: (cm) 0.1 Area: (cm) 0.031 Volume: (cm) 0.003 % Reduction in Area: 50.8% % Reduction in Volume: 50% Epithelialization: Medium  (34-66%) Tunneling: No Undermining: No Wound Description Classification: Full Thickness Without Exposed Support Structures Wound Margin: Flat and Intact Exudate Amount: Small Exudate Type: Serosanguineous Exudate Color: red, brown Foul Odor After Cleansing: No Slough/Fibrino Yes Wound Bed Granulation Amount: Large (67-100%) Exposed Structure Granulation Quality: Red Fascia Exposed: No Necrotic Amount: None Present (0%) Fat Layer (Subcutaneous Tissue) Exposed: Yes Tendon Exposed: No Muscle Exposed: No Joint Exposed: No Bone Exposed: No Treatment Notes Wound #46 (Right, Dorsal Ankle) 1. Cleanse With Wound Cleanser 3. Primary Dressing Applied Calcium Alginate Ag 4. Secondary Dressing Foam Border Dressing Electronic Signature(s) Signed: 08/26/2020 5:49:38 PM By: Baruch Gouty RN, BSN Entered By: Baruch Gouty on 08/26/2020 15:12:48 -------------------------------------------------------------------------------- [place] Details Patient Name: Date of Service: [name], [name] [name] 08/26/2020 2:30 PM Medical Record Number: [id number] Patient Account Number: [id number] Date of Birth/Sex: Treating RN: [birth year] (34 y.o. [name] Primary Care Nino Amano: [place], [place] Other Clinician: Referring Thersa Mohiuddin: Treating Dewaine Morocho/Extender: [name]: 242 Vital Signs Time Taken: 15:03 Temperature (F): 98.4 Height (in): 70 Pulse (bpm): 108 Source: Stated Respiratory Rate (breaths/min): 18 Weight (lbs): 216 Blood Pressure (mmHg): 150/77 Source: Stated Reference Range: 80 - 120 mg / dl Body Mass Index (BMI): 31 Electronic Signature(s) Signed: 08/26/2020 5:49:38 PM By: Baruch Gouty RN, BSN Entered By: Baruch Gouty on 08/26/2020 15:03:45

## 2020-08-27 ENCOUNTER — Telehealth: Payer: Self-pay | Admitting: Psychiatry

## 2020-08-27 DIAGNOSIS — F341 Dysthymic disorder: Secondary | ICD-10-CM

## 2020-08-27 MED ORDER — SERTRALINE HCL 100 MG PO TABS: 100.0000 mg | ORAL_TABLET | Freq: Every day | ORAL | 0 refills | Status: DC

## 2020-08-27 NOTE — Telephone Encounter (Signed)
Okay to send to get till his apt?

## 2020-08-27 NOTE — Telephone Encounter (Signed)
[name] called to make an appt so he could get refill of his sertraline. He is now scheduled for Monday, 09/01/20.  He asked if he could at least get enough to get to his appt.

## 2020-08-27 NOTE — Telephone Encounter (Signed)
Patient requests another refill of Zoloft subsequent to that of 07/20/2020 for a month supply making his appointment for next week to follow-up persistent depressive disorder with no contraindication 4 months overdue having other more prioritized medical needs.

## 2020-09-01 ENCOUNTER — Encounter: Payer: Self-pay | Admitting: Psychiatry

## 2020-09-01 ENCOUNTER — Encounter (HOSPITAL_BASED_OUTPATIENT_CLINIC_OR_DEPARTMENT_OTHER): Payer: BC Managed Care – PPO | Admitting: Internal Medicine

## 2020-09-01 ENCOUNTER — Ambulatory Visit (INDEPENDENT_AMBULATORY_CARE_PROVIDER_SITE_OTHER): Payer: BC Managed Care – PPO | Admitting: Psychiatry

## 2020-09-01 ENCOUNTER — Other Ambulatory Visit: Payer: Self-pay

## 2020-09-01 DIAGNOSIS — Z87891 Personal history of nicotine dependence: Secondary | ICD-10-CM | POA: Diagnosis not present

## 2020-09-01 DIAGNOSIS — G4733 Obstructive sleep apnea (adult) (pediatric): Secondary | ICD-10-CM | POA: Diagnosis not present

## 2020-09-01 DIAGNOSIS — L97521 Non-pressure chronic ulcer of other part of left foot limited to breakdown of skin: Secondary | ICD-10-CM | POA: Diagnosis not present

## 2020-09-01 DIAGNOSIS — L89323 Pressure ulcer of left buttock, stage 3: Secondary | ICD-10-CM | POA: Diagnosis not present

## 2020-09-01 DIAGNOSIS — Z9989 Dependence on other enabling machines and devices: Secondary | ICD-10-CM

## 2020-09-01 DIAGNOSIS — Z9081 Acquired absence of spleen: Secondary | ICD-10-CM | POA: Diagnosis not present

## 2020-09-01 DIAGNOSIS — N319 Neuromuscular dysfunction of bladder, unspecified: Secondary | ICD-10-CM

## 2020-09-01 DIAGNOSIS — L97511 Non-pressure chronic ulcer of other part of right foot limited to breakdown of skin: Secondary | ICD-10-CM | POA: Diagnosis not present

## 2020-09-01 DIAGNOSIS — Z8614 Personal history of Methicillin resistant Staphylococcus aureus infection: Secondary | ICD-10-CM | POA: Diagnosis not present

## 2020-09-01 DIAGNOSIS — I87332 Chronic venous hypertension (idiopathic) with ulcer and inflammation of left lower extremity: Secondary | ICD-10-CM | POA: Diagnosis not present

## 2020-09-01 DIAGNOSIS — L97321 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 to breakdown of skin: Secondary | ICD-10-CM | POA: Diagnosis not present

## 2020-09-01 DIAGNOSIS — G8221 Paraplegia, complete: Secondary | ICD-10-CM | POA: Diagnosis not present

## 2020-09-01 DIAGNOSIS — F341 Dysthymic disorder: Secondary | ICD-10-CM | POA: Diagnosis not present

## 2020-09-01 DIAGNOSIS — L97311 Non-pressure chronic ulcer of right ankle limited to breakdown of skin: Secondary | ICD-10-CM | POA: Diagnosis not present

## 2020-09-01 DIAGNOSIS — Z89422 Acquired absence of other left toe(s): Secondary | ICD-10-CM | POA: Diagnosis not present

## 2020-09-01 MED ORDER — SERTRALINE HCL 100 MG PO TABS: 100.0000 mg | ORAL_TABLET | Freq: Every day | ORAL | 11 refills | Status: DC
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
## 2020-09-01 NOTE — Progress Notes (Signed)
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 aggressively for this although we have not made any improvement here. 08/11/17; left lateral leg. Surface continues to improve with the Iodoflex although we are not seeing much improvement in overall wound dimensions. Areas on his plantar left foot and right foot show no improvement. In fact the right foot looks somewhat worse 08/18/17; left lateral leg. We changed to [facility] Blue last week after a prolonged course of Iodoflex which helps get the surface better. It appears that the wound with is improved. Continue with difficult areas on the left dorsal first second and plantar first second on the right  09/01/17; patient arrives in clinic today having had a temperature of 103 yesterday. He was seen in the ER and [facility]. The patient was concerned he could have cellulitis again in the right leg however they diagnosed him with a UTI and he is now on Keflex. He has a history of cellulitis which is been recurrent and difficult but this is been in the left leg, in the past 5 use doxycycline. He does in and out catheterizations at home which are risk factors for UTI 09/08/17; patient will be completing his Keflex this weekend. The erythema on the left leg is considerably better. He has a new  wound today on the medial part of the right leg small superficial almost looks like a skin tear. He has worsening of the area on the right dorsal first and second toe. His major area on the left lateral leg is better. Using Hydrofera Blue on all areas 09/15/17; gradual reduction in width on the long wound in the left lateral leg. No debridement required. He also has wounds on the plantar aspect of his left first second toe web space and on the dorsal aspect of the right first second toe web space. 09/22/17; there continues to be very gradual improvements in the dimensions of the left lateral leg wound. He hasn't round erythematous spot with might be pressure on his wheelchair. There is no evidence obviously of infection no purulence no warmth He has a dry scaled area on the plantar aspect of the left first second toe Improved area on the dorsal right first second toe. 09/29/17; left lateral leg wound continues to improve in dimensions mostly with an is still a fairly long but increasingly narrow wound. He has a dry scaled area on the plantar aspect of his left first second toe web space Increasingly concerning area on the dorsal right first second toe. In fact I am concerned today about possible cellulitis around this wound. The areas extending up his second toe and although there is deformities here almost appears to abut on the nailbed. 10/06/17; left lateral leg wound continues to make very gradual progress. Tissue culture I did from the right first second toe dorsal foot last time grew MRSA and enterococcus which was vancomycin sensitive. This was not sensitive to clindamycin or doxycycline. He is allergic to Zyvox and sulfa we have therefore arrange for him to have dalvance infusion tomorrow. He is had this in the past and tolerated it well 10/20/17; left lateral leg wound continues to make decent progress. This is certainly reduced in terms of with there is advancing epithelialization.The  cellulitis in the right foot looks better although he still has a deep wound in the dorsal aspect of the first second toe web space. Plantar left first toe web space on the left I think is making some progress 10/27/17; left lateral leg wound continues to make decent progress. Advancing epithelialization.using Hydrofera Blue The right first second toe web space wound is better-looking using silver alginate Improvement in the left plantar first second toe web space. Again using silver alginate 11/03/17 left lateral leg wound continues to make decent progress albeit slowly. Using [facility] The right per second toe web space continues to be a very problematic looking punched out wound. I obtained a piece of tissue for deep culture I did extensively treated this for fungus. It is difficult to imagine that this is a pressure area as the patient states other than going outside he doesn't really wear shoes at home The  left plantar first second toe web space looked fairly senescent. Necrotic edges. This required debridement change to [facility] Blue to all wound areas 11/10/17; left lateral leg wound continues to contract. Using Hydrofera Blue On the right dorsal first second toe web space dorsally. Culture I did of this area last week grew MRSA there is not an easy oral option in this patient was multiple antibiotic allergies or intolerances. This was only a rare culture isolate I'm therefore going to use Bactroban under silver alginate On the left plantar first second toe web space. Debridement is required here. This is also unchanged 11/17/17; left lateral leg wound continues to contract using Hydrofera Blue this is no longer the major issue. The major concern here is the right first second toe web space. He now has an open area going from dorsally to the plantar aspect. There is now wound on the inner lateral part of the first toe. Not a very viable surface on this. There is erythema spreading  medially into the forefoot. No major change in the left first second toe plantar wound 11/24/17; left lateral leg wound continues to contract using Hydrofera Blue. Nice improvement today The right first second toe web space all of this looks a lot less angry than last week. I have given him clindamycin and topical Bactroban for MRSA and terbinafine for the possibility of underlining tinea pedis that I could not control with ketoconazole. Looks somewhat better The area on the plantar left first second toe web space is weeping with dried debris around the wound 12/01/17; left lateral leg wound continues to contract he Hydrofera Blue. It is becoming thinner in terms of with nevertheless it is making good improvement. The right first second toe web space looks less angry but still a large necrotic-looking wounds starting on the plantar aspect of the right foot extending between the toes and now extensively on the base of the right second toe. I gave him clindamycin and topical Bactroban for MRSA anterior benefiting for the possibility of underlying tinea pedis. Not looking better today The area on the left first/second toe looks better. Debrided of necrotic debris 12/05/17* the patient was worked in urgently today because over the weekend he found blood on his incontinence bad when he woke up. He was found to have an ulcer by his wife who does most of his wound care. He came in today for [place] to look at this. He has not had a history of wounds in his buttocks in spite of his paraplegia. 12/08/17; seen in follow-up today at his usual appointment. He was seen earlier this week and found to have a new wound on his buttock. We also follow him for wounds on the left lateral leg, left first second toe web space and right first second toe web space 12/15/17; we have been using Hydrofera Blue to the left lateral leg which has improved. The right first second toe web space has also improved. Left first second toe web  space plantar aspect looks stable. The left buttock has worsened using Santyl. Apparently the buttock has drainage 12/22/17; we have been using Hydrofera Blue to the left lateral leg which continues to improve now 2 small wounds separated by normal skin. He tells [place] he had a fever up to 100 yesterday he is prone to UTIs but has not noted anything different. He does in and out catheterizations. The area between the first and second toes today does not look good necrotic surface covered with what looks  to be purulent drainage and erythema extending into the third toe. I had gotten this to something that I thought look better last time however it is not look good today. He also has a necrotic surface over the buttock wound which is expanded. I thought there might be infection under here so I removed a lot of the surface with a #5 curet though nothing look like it really needed culturing. He is been using Santyl to this area 12/27/17; his original wound on the left lateral leg continues to improve using Hydrofera Blue. I gave him samples of Baxdella although he was unable to take them out of fear for an allergic reaction ["lump in his throat"].the culture I did of the purulent drainage from his second toe last week showed both enterococcus and a set Enterobacter I was also concerned about the erythema on the bottom of his foot although paradoxically although this looks somewhat better today. 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 We've been using silver alginate here. They came up today that he sleeps in a recliner, I'm not sure why but I asked him to stop this 01/03/18; his original wound we've been using Hydrofera Blue is now separated into 2 areas. Ulcer on his left buttock is better he is off the recliner and sleeping in bed Finally both wound areas between his first and second toes also looks some  better 01/10/18; his original wound on the left lateral leg is now separated into 2 wounds we've been using Hydrofera Blue Ulcer on his left buttock has some drainage. There is a small probing site going into muscle layer superiorly.using silver alginate -He arrives today with a deep tissue injury on the left heel The wound on the dorsal aspect of his first second toe on the left looks a lot betterusing silver alginate ketoconazole The area on the first second toe web space on the right also looks a lot bette 01/17/18; his original wound on the left lateral leg continues to progress using Hydrofera Blue Ulcer on his left buttock also is smaller surface healthier except for a small probing site going into the muscle layer superiorly. 2.4 cm of tunneling in this area DTI on his left heel we have only been offloading. Looks better than last week no threatened open no evidence of infection the wound on the dorsal aspect of the first second toe on the left continues to look like it's regressing we have only been using silver alginate and terbinafine orally The area in the first second toe web space on the right also looks to be a lot better using silver alginate and terbinafine I think this was prompted by tinea pedis 01/31/18; the patient was hospitalized in [place] last week apparently for a complicated UTI. He was discharged on cefepime he does in and out catheterizations. In the hospital he was discovered M I don't mild elevation of AST and ALT and the terbinafine was stopped.predictably the pressure ulcer on s his buttock looks betterusing silver alginate. The area on the left lateral leg also is better using Hydrofera Blue. The area between the first and second toes on the left better. First and second toes on the right still substantial but better. Finally the DTI on the left heel has held together and looks like it's resolving 02/07/18-he is here in follow-up evaluation for multiple ulcerations.  He has new injury to the lateral aspect of  the last issue a pressure ulcer, he states this is from adhesive removal trauma. He states he has tried multiple adhesive products with no success. All other ulcers appear stable. The left heel DTI is resolving. We will continue with same treatment plan and follow-up next week. 02/14/18; follow-up for multiple areas. He has a new area last week on the lateral aspect of his pressure ulcer more over the posterior trochanter. The original pressure ulcer looks quite stable has healthy granulation. 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 Almost fully epithelialized on the original superior area using Hydrofera Blue DTI on the left heel has peeled off this week to reveal a small superficial wound under denuded skin and subcutaneous tissue Both areas between the first and second toes look better including nothing open on the left 02/21/18; The patient's wounds on his left ischial tuberosity and posterior left greater trochanter actually looked better. He has a large area of irritation around the area which I think is contact dermatitis. I am doubtful that this is fungal His original wound on the left lateral calf continues to improve we have been using Hydrofera Blue There is no open area in the left first second toe web space although there is a lot of thick callus The DTI on the left heel required debridement today of necrotic surface eschar and subcutaneous tissue using silver alginate Finally the area on the right first second toe webspace continues to contract using silver alginate and ketoconazole 02/28/18 Left ischial tuberosity wounds look better using silver alginate. Original wound on the left calf only has one small open area left using Hydrofera Blue DTI on the left heel required debridement mostly removing skin from around this wound surface. Using  silver alginate The areas on the right first/second toe web space using silver alginate and ketoconazole 03/08/18 on evaluation today patient appears to be doing decently well as best I can tell in regard to his wounds. This is the first time that I have seen him as he generally is followed by Dr. Dellia Nims. With that being said none of his wounds appear to be infected he does have an area where there is some skin covering what appears to be a new wound on the left dorsal surface of his great toe. This is right at the nail bed. With that being said I do believe that debrided away some of the excess skin can be of benefit in this regard. Otherwise he has been tolerating the dressing changes without complication. 03/14/18; patient arrives today with the multiplicity of wounds that we are following. He has not been systemically unwell Original wound on the left lateral calf now only has 2 small open areas we've been using Hydrofera Blue which should continue The deep tissue injury on the left heel requires debridement today. We've been using silver alginate The left first second toe and the right first second toe are both are reminiscence what I think was tinea pedis. Apparently some of the callus Surface between the toes was removed last week when it started draining. Purulent drainage coming from the wound on the ischial tuberosity on the left. 03/21/18-He is here in follow-up evaluation for multiple wounds. 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 He tolerated debridement. The only change to last week's recommendations is to discontinue antifungal cream between toes. He will follow-up next week  03/28/18; following up for multiple wounds;Concern this week is streaking redness and swelling in the right foot. He is going to need antibiotics for this. 03/31/18; follow-up for right foot cellulitis. Streaking redness and swelling in the  right foot on 03/28/18. 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 I put him on clindamycin 300 mg every 6 and brought him in for a quick check. He has an open wound between his first and second toes on the right foot as a potential source. 04/04/18; Right foot cellulitis is resolving he is completing clindamycin. This is truly good news Left lateral calf wound which is initial wound only has one small open area inferiorly this is close to healing out. He has compression stockings. We will use Hydrofera Blue right down to the epithelialization of this Nonviable surface on the left heel which was initially pressure with a DTI. We've been using Hydrofera Blue. I'm going to switch this back to silver alginate Left first second toe/tinea pedis this looks better using silver alginate Right first second toe tinea pedis using silver alginate Large pressure ulcers on theLeft ischial tuberosity. Small wound here Looks better. I am uncertain about the surface over the large wound. Using silver alginate 04/11/18; Cellulitis in the right foot is resolved Left lateral calf wound which was his original wounds still has 2 tiny open areas remaining this is just about closed Nonviable surface on the left heel is better but still requires debridement Left first second toe/tinea pedis still open using silver alginate Right first second toe wound tinea pedis I asked him to go back to using ketoconazole and silver alginate Large pressure ulcers on the left ischial tuberosity this shear injury here is resolved. Wound is smaller. No evidence of infection using silver alginate 04/18/18; Patient arrives with an intense area of cellulitis in the right mid lower calf extending into the right heel area. Bright red and warm. Smaller area on the left anterior leg. He has a significant history of MRSA. He will definitely need antibioticsdoxycycline He now has 2 open areas on the left ischial tuberosity the  original large wound and now a satellite area which I think was above his initial satellite areas. Not a wonderful surface on this satellite area surrounding erythema which looks like pressure related. His left lateral calf wound again his original wound is just about closed Left heel pressure injury still requiring debridement Left first second toe looks a lot better using silver alginate Right first second toe also using silver alginate and ketoconazole cream also looks better 04/20/18; the patient was worked in early today out of concerns with his cellulitis on the right leg. I had started him on doxycycline. This was 2 days ago. His wife was concerned about the swelling in the area. Also concerned about the left buttock. He has not been systemically unwell no fever chills. No nausea vomiting or diarrhea 04/25/18; the patient's left buttock wound is continued to deteriorate he is using Hydrofera Blue. He is still completing clindamycin for the cellulitis on the right leg although all of this looks better. 05/02/18 Left buttock wound still with a lot of drainage and a very tightly adherent fibrinous necrotic surface. He has a deeper area superiorly The left lateral calf wound is still closed DTI wound on the left heel necrotic surface especially the circumference using Iodoflex Areas between his left first second toe and right first second toe both look better. Dorsally and the right first second toe he  had a necrotic surface although at smaller. In using silver alginate and ketoconazole. I did a culture last week which was a deep tissue culture of the reminiscence of the open wound on the right first second toe dorsally. This grew a few Acinetobacter and a few methicillin-resistant staph aureus. Nevertheless the area actually this week looked better. I didn't feel the need to specifically address this at least in terms of systemic antibiotics. 05/09/18; wounds are measuring larger more drainage per  our intake. We are using Santyl covered with alginate on the large superficial buttock wounds, Iodosorb on the left heel, ketoconazole and silver alginate to the dorsal first and second toes bilaterally. 05/16/18; The area on his left buttock better in some aspects although the area superiorly over the ischial tuberosity required an extensive debridement.using Santyl Left heel appears stable. Using Iodoflex The areas between his first and second toes are not bad however there is spreading erythema up the dorsal aspect of his left foot this looks like cellulitis again. He is insensate the erythema is really very brilliant.o Erysipelas He went to see an allergist days ago because he was itching part of this he had lab work done. This showed a white count of 15.1 with 70% neutrophils. Hemoglobin of 11.4 and a platelet count of 659,000. Last white count we had in Epic was a 2-1/2 years ago which was 25.9 but he was ill at the time. He was able to show me some lab work that was done by his primary physician the pattern is about the same. 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 But prompted me to go ahead and do x-rays of both feet and the pelvis rule out osteomyelitis. He also had a comprehensive metabolic panel this was reasonably normal his albumin was 3.7 liver function tests BUN/creatinine all normal 05/23/18; x-rays of both his feet from last week were negative for underlying pulmonary abnormality. The x-ray of his pelvis however showed mild irregularity in the left ischial which may represent some early osteomyelitis. The wound in the left ischial continues to get deeper clearly now exposed muscle. Each week necrotic surface material over this area. Whereas the rest of the wounds do not look so bad. The left ischial wound we have been using Santyl and calcium alginate T the left heel surface necrotic debris using Iodoflex o The left lateral leg is still  healed Areas on the left dorsal foot and the right dorsal foot are about the same. There is some inflammation on the left which might represent contact dermatitis, fungal dermatitis I am doubtful cellulitis although this looks better than last week 05/30/18; CT scan done at Hospital did not show any osteomyelitis or abscess. Suggested the possibility of underlying cellulitis although I don't see a lot of evidence of this at the bedside The wound itself on the left buttock/upper thigh actually looks somewhat better. No debridement Left heel also looks better no debridement continue Iodoflex Both dorsal first second toe spaces appear better using Lotrisone. Left still required debridement 06/06/18; Intake reported some purulent looking drainage from the left gluteal wound. Using Santyl and calcium alginate Left heel looks better although still a nonviable surface requiring debridement The left dorsal foot first/second webspace actually expanding and somewhat deeper. I may consider doing a shave biopsy of this area Right dorsal foot first/second webspace appears stable to improved. Using Lotrisone and silver alginate to both these areas 06/13/18 Left gluteal surface looks  better. Now separated in the 2 wounds. No debridement required. Still drainage. We'll continue silver alginate Left heel continues to look better with Iodoflex continue this for at least another week Of his dorsal foot wounds the area on the left still has some depth although it looks better than last week. We've been using Lotrisone and silver alginate 06/20/18 Left gluteal continues to look better healthy tissue Left heel continues to look better healthy granulation wound is smaller. He is using Iodoflex and his long as this continues continue the Iodoflex Dorsal right foot looks better unfortunately dorsal left foot does not. There is swelling and erythema of his forefoot. He had minor trauma to this several days ago but doesn't  think this was enough to have caused any tissue injury. Foot looks like cellulitis, we have had this problem before 06/27/18 on evaluation today patient appears to be doing a little worse in regard to his foot ulcer. 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 Both of which would really be his only options for treating this infection. In the past he has been given and effusion of Orbactiv. This is done very well for him in the past again it's one time dosing IV antibiotic therapy. Subsequently I do believe this is something we're gonna need to see about doing at this point in time. Currently his other wounds seem to be doing somewhat better in my pinion I'm pretty happy in that regard. 07/03/18 on evaluation today patient's wounds actually appear to be doing fairly well. He has been tolerating the dressing changes without complication. All in all he seems to be showing signs of improvement. In regard to the antibiotics he has been dealing with infectious disease since I saw him last week as far as getting this scheduled. In the end [facility]'s [facility] this coming Friday. In the meantime he has been continuing to perform the dressing changes in such as previous. There does not appear to be any evidence of infection worsengin at this time. 07/10/18; Since I last saw this man 2 weeks ago things have actually improved. IV antibiotics of resulted in less forefoot erythema although there is still some present. He is not systemically unwell Left buttock wounds 2 now have no depth there is increased epithelialization Using silver alginate Left heel still requires debridement using Iodoflex Left dorsal foot still with a sizable wound about the size of a border but healthy granulation Right dorsal foot still with a  slitlike area using silver alginate 07/18/18; the patient's cellulitis in the left foot is improved in fact I think it is on its way to resolving. Left buttock wounds 2 both look better although the larger one has hypertension granulation we've been using silver alginate Left heel has some thick circumferential redundant skin over the wound edge which will need to be removed today we've been using Iodoflex Left dorsal foot is still a sizable wound required debridement using silver alginate The right dorsal foot is just about closed only a small open area remains here 07/25/18; left foot cellulitis is resolved Left buttock wounds 2 both look better. Hyper-granulation on the major area Left heel as some debris over the surface but otherwise looks a healthier wound. Using silver collagen Right dorsal foot is just about closed 07/31/18; arrives with  our intake nurse worried about purulent drainage from the buttock. We had hyper-granulation here last week His buttock wounds 2 continue to look better Left heel some debris over the surface but measuring smaller. Right dorsal foot unfortunately has openings between the toes Left foot superficial wound looks less aggravated. 08/07/18 Buttock wounds continue to look better although some of her granulation and the larger medial wound. silver alginate Left heel continues to look a lot better.silver collagen Left foot superficial wound looks less stable. Requires debridement. He has a new wound superficial area on the foot on the lateral dorsal foot. Right foot looks better using silver alginate without Lotrisone 08/14/2018; patient was in the ER last week diagnosed with a UTI. He is now on Cefpodoxime and Macrodantin. Buttock wounds continued to be smaller. Using silver alginate Left heel continues to look better using silver collagen Left foot superficial wound looks as though it is improving Right dorsal foot area is just about healed. 08/21/2018; patient is  completed his antibiotics for his UTI. He has 2 open areas on the buttocks. There is still not closed although the surface looks satisfactory. Using silver alginate Left heel continues to improve using silver collagen The bilateral dorsal foot areas which are at the base of his first and second toes/possible tinea pedis are actually stable on the left but worse on the right. The area on the left required debridement of necrotic surface. After debridement I obtained a specimen for PCR culture. The right dorsal foot which is been just about healed last week is now reopened 08/28/2018; culture done on the left dorsal foot showed coag negative staph both staph epidermidis and Lugdunensis. I think this is worthwhile initiating systemic treatment. I will use doxycycline given his long list of allergies. The area on the left heel slightly improved but still requiring debridement. The large wound on the buttock is just about closed whereas the smaller one is larger. Using silver alginate in this area 09/04/2018; patient is completing his doxycycline for the left foot although this continues to be a very difficult wound area with very adherent necrotic debris. We are using silver alginate to all his wounds right foot left foot and the small wounds on his buttock, silver collagen on the left heel. 09/11/2018; once again this patient has intense erythema and swelling of the left forefoot. Lesser degrees of erythema in the right foot. He has a long list of allergies and intolerances. I will reinstitute doxycycline. 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 Using silver alginate Left heel also looks better using silver collagen Unfortunately both the areas on his feet look worse. The area on the left first second webspace is now gone through to the plantar part of his foot. The area on the left foot anteriorly is irritated with erythema and swelling in the  forefoot. 09/25/2018 His wound on the left plantar heel looks better. Using silver collagen The area on the left buttock 2 small remnant areas. One is closed one is still open. Using silver alginate The areas between both his first and second toes look worse. This in spite of long-standing antifungal therapy with ketoconazole and silver alginate which should have antifungal activity He has small areas around his original wound on the left calf one is on the bottom of the original scar tissue and one superiorly both of these are small and superficial but again given wound history in this site this is  worrisome 10/02/2018 Left plantar heel continues to gradually contract using silver collagen Left buttock wound is unchanged using silver alginate The areas on his dorsal feet between his first and second toes bilaterally look about the same. I prescribed clindamycin ointment to see if we can address chronic staph colonization and also the underlying possibility of erythrasma The left lateral lower extremity wound is actually on the lateral part of his ankle. Small open area here. We have been using silver alginate 10/09/2018; Left plantar heel continues to look healthy and contract. No debridement is required Left buttock slightly smaller with a tape injury wound just below which was new this week Dorsal feet somewhat improved I have been using clindamycin Left lateral looks lower extremity the actual open area looks worse although a lot of this is epithelialized. I am going to change to silver collagen today He has a lot more swelling in the right leg although this is not pitting not red and not particularly warm there is a lot of spasm in the right leg usually indicative of people with paralysis of some underlying discomfort. We have reviewed his vascular status from 2017 he had a left greater saphenous vein ablation. I wonder about referring him back to vascular surgery if the area on the left  leg continues to deteriorate. 10/16/2018 in today for follow-up and management of multiple lower extremity ulcers. His left Buttock wound is much lower smaller and almost closed completely. The wound to the left ankle has began to reopen with Epithelialization and some adherent slough. He has multiple new areas to the left foot and leg. The left dorsal foot without much improvement. Wound present between left great webspace and 2nd toe. Erythema and edema present right leg. Right LE ultrasound obtained on 10/10/18 was negative for DVT . 10/23/2018; Left buttock is closed over. Still dry macerated skin but there is no open wound. I suspect this is chronic pressure/moisture Left lateral calf is quite a bit worse than when I saw this last. There is clearly drainage here he has macerated skin into the left plantar heel. We will change the primary dressing to alginate Left dorsal foot has some improvement in overall wound area. Still using clindamycin and silver alginate Right dorsal foot about the same as the left using clindamycin and silver alginate The erythema in the right leg has resolved. He is DVT rule out was negative Left heel pressure area required debridement although the wound is smaller and the surface is health 10/26/2018 The patient came back in for his nurse check today predominantly because of the drainage coming out of the left lateral leg with a recent reopening of his original wound on the left lateral calf. He comes in today with a large amount of surrounding erythema around the wound extending from the calf into the ankle and even in the area on the dorsal foot. He is not systemically unwell. He is not febrile. Nevertheless this looks like cellulitis. We have been using silver alginate to the area. I changed him to a regular visit and I am going to prescribe him doxycycline. The rationale here is a long list of medication intolerances and a history of MRSA. I did not see anything  that I thought would provide a valuable culture 10/30/2018 Follow-up from his appointment 4 days ago with really an extensive area of cellulitis in the left calf left lateral ankle and left dorsal foot. I put him on doxycycline. He has a long list of medication allergies  which are true allergy reactions. 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 In any case he is a lot better today. The erythema especially in the anterior and lateral left calf is better. He still has left ankle erythema. He also is complaining about increasing edema in the right leg we have only been using Kerlix Coban and he has been doing the wraps at home. Finally he has a spotty rash on the medial part of his upper left calf which looks like folliculitis or perhaps wrap occlusion type injury. Small superficial macules not pustules 11/06/18 patient arrives today with again a considerable degree of erythema around the wound on the left lateral calf extending into the dorsal ankle and dorsal foot. This is a lot worse than when I saw this last week. He is on doxycycline really with not a lot of improvement. He has not been systemically unwell Wounds on the; left heel actually looks improved. Original area on the left foot and proximity to the first and second toes looks about the same. He has superficial areas on the dorsal foot, anterior calf and then the reopening of his original wound on the left lateral calf which looks about the same The only area he has on the right is the dorsal webspace first and second which is smaller. He has a large area of dry erythematous skin on the left buttock small open area here. 11/13/2018; the patient arrives in much better condition. The erythema around the wound on the left lateral calf is a lot better. 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 In any case this is a lot better. The area on the left heel is very small and just about resolved using silver collagen we have been using silver alginate to the areas on his dorsal feet 11/20/2018; his wounds include the left lateral calf, left heel, dorsal aspects of both feet just proximal to the first second webspace. He is stable to slightly improved. I did not think any changes to his dressings were going to be necessary 11/27/2018 he has a reopening on the left buttock which is surrounded by what looks like tinea or perhaps some other form of dermatitis. The area on the left dorsal foot has some erythema around it I have marked this area but I am not sure whether this is cellulitis or not. Left heel is not closed. Left calf the reopening is really slightly longer and probably worse 1/13; in general things look better and smaller except for the left dorsal foot. Area on the left heel is just about closed, left buttock looks better only a small wound remains in the skin looks better [using Lotrisone] 1/20; the area on the left heel only has a few remaining open areas here. Left lateral calf about the same in terms of size, left dorsal foot slightly larger right lateral foot still not closed. The area on the left buttock has no open wound and the surrounding skin looks a lot better 1/27; the area on the left heel is closed. Left lateral calf better but still requiring extensive debridements. The area on his left buttock is closed. He still has the open areas on the left dorsal foot which is slightly smaller in the right foot which is slightly expanded. We have been using Iodoflex on these areas as well 2/3; left heel is  closed. Left lateral calf still requiring debridement using Iodoflex there is no open area on his left buttock however he has dry scaly skin over a large area of this. Not really responding well to the Lotrisone. Finally the areas on his dorsal feet at the level of the  first second webspace are slightly smaller on the right and about the same on the left. Both of these vigorously debrided with Anasept and gauze 2/10; left heel remains closed he has dry erythematous skin over the left buttock but there is no open wound here. Left lateral leg has come in and with. Still requiring debridement we have been using Iodoflex here. Finally the area on the left dorsal foot and right dorsal foot are really about the same extremely dry callused fissured areas. He does not yet have a dermatology appointment 2/17; left heel remains closed. He has a new open area on the left buttock. The area on the left lateral calf is bigger longer and still covered in necrotic debris. No major change in his foot areas bilaterally. I am awaiting for a dermatologist to look on this. We have been using ketoconazole I do not know that this is been doing any good at all. 2/24; left heel remains closed. The left buttock wound that was new reopening last week looks better. The left lateral calf appears better also although still requires debridement. The major area on his foot is the left first second also requiring debridement. We have been putting Prisma on all wounds. I do not believe that the ketoconazole has done too much good for his feet. He will use Lotrisone I am going to give him a 2-week course of terbinafine. We still do not have a dermatology appointment 3/2 left heel remains closed however there is skin over bone in this area I pointed this out to him today. The left buttock wound is epithelialized but still does not look completely stable. The area on the left leg required debridement were using silver collagen here. With regards to his feet we changed to Lotrisone last week and silver alginate. 3/9; left heel remains closed. Left buttock remains closed. The area on the right foot is essentially closed. The left foot remains unchanged. Slightly smaller on the left lateral calf. Using  silver collagen to both of these areas 3/16-Left heel remains closed. Area on right foot is closed. Left lateral calf above the lateral malleolus open wound requiring debridement with easy bleeding. Left dorsal wound proximal to first toe also debrided. Left ischial area open new. Patient has been using Prisma with wrapping every 3 days. Dermatology appointment is apparently tomorrow.Patient has completed his terbinafine 2-week course with some apparent improvement according to him, there is still flaking and dry skin in his foot on the left 3/23; area on the right foot is reopened. The area on the left anterior foot is about the same still a very necrotic adherent surface. He still has the area on the left leg and reopening is on the left buttock. He apparently saw dermatology although I do not have a note. According to the patient who is usually fairly well informed they did not have any good ideas. Put him on oral terbinafine which she is been on before. 3/30; using silver collagen to all wounds. Apparently his dermatologist put him on doxycycline and rifampin presumably some culture grew staph. I do not have this result. He remains on terbinafine although I have used terbinafine on him before 4/6; patient has had  a fairly substantial reopening on the right foot between the first and second toes. He is finished his terbinafine and I believe is on doxycycline and rifampin still as prescribed by dermatology. We have been using silver collagen to all his wounds although the patient reports that he thinks silver alginate does better on the wounds on his buttock. 4/13; the area on his left lateral calf about the same size but it did not require debridement. Left dorsal foot just proximal to the webspace between the first and second toes is about the same. Still nonviable surface. I note some superficial bronze discoloration of the dorsal part of his foot Right dorsal foot just proximal to the first  and second toes also looks about the same. I still think there may be the same discoloration I noted above on the left Left buttock wound looks about the same 4/20; left lateral calf appears to be gradually contracting using silver collagen. 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 The left dorsal foot wound is debrided of tightly adherent necrotic debris and really cleans up quite nicely. The right area is worse with expansion. I did not debride this it is now over the base of the second toe The area on his left buttock is smaller no debridement is required using silver collagen 5/4; left calf continues to make good progress. He arrives with erythema around the wounds on his dorsal foot which even extends to the plantar aspect. Very concerning for coexistent infection. He is finished the erythromycin I gave him for possible erythrasma this does not seem to have helped. The area on the left foot is about the same base of the dorsal toes Is area on the buttock looks improved on the left 5/11; left calf and left buttock continued to make good progress. Left foot is about the same to slightly improved. Major problem is on the right foot. He has not had an x-ray. Deep tissue culture I did last week showed both Enterobacter and E. coli. I did not change the doxycycline I put him on empirically although neither 1 of these were plated to doxycycline. He arrives today with the erythema looking worse on both the dorsal and plantar foot. Macerated skin on the bottom of the foot. he has not been systemically unwell 5/18-Patient returns at 1 week, left calf wound appears to be making some progress, left buttock wound appears slightly worse than last time, left foot wound looks slightly better, right foot redness is marginally better. X-ray of both feet show no air or evidence of osteomyelitis. Patient is finished his Omnicef and terbinafine. He  continues to have macerated skin on the bottom of the left foot as well as right 5/26; left calf wound is better, left buttock wound appears to have multiple small superficial open areas with surrounding macerated skin. X-rays that I did last time showed no evidence of osteomyelitis in either foot. He is finished cefdinir and doxycycline. I do not think that he was on terbinafine. He continues to have a large superficial open area on the right foot anterior dorsal and slightly between the first and second toes. I did send him to dermatology 2 months ago or so wondering about whether they would do a fungal scraping. I do not believe they did but did do a culture. We have been using silver alginate to the toe areas, he has been using antifungals at home topically either ketoconazole or Lotrisone. We are using silver  collagen on the left foot, silver alginate on the right, silver collagen on the left lateral leg and silver alginate on the left buttock 6/1; left buttock area is healed. We have the left dorsal foot, left lateral leg and right dorsal foot. We are using silver alginate to the areas on both feet and silver collagen to the area on his left lateral calf 6/8; the left buttock apparently reopened late last week. He is not really sure how this happened. He is tolerating the terbinafine. Using silver alginate to all wounds 6/15; left buttock wound is larger than last week but still superficial. Came in the clinic today with a report of purulence from the left lateral leg I did not identify any infection Both areas on his dorsal feet appear to be better. He is tolerating the terbinafine. Using silver alginate to all wounds 6/22; left buttock is about the same this week, left calf quite a bit better. His left foot is about the same however he comes in with erythema and warmth in the right forefoot once again. Culture that I gave him in the beginning of May showed Enterobacter and E. coli. I gave him  doxycycline and things seem to improve although neither 1 of these organisms was specifically plated. 6/29; left buttock is larger and dry this week. Left lateral calf looks to me to be improved. Left dorsal foot also somewhat improved right foot completely unchanged. The erythema on the right foot is still present. He is completing the Ceftin dinner that I gave him empirically [see discussion above.) 7/6 - All wounds look to be stable and perhaps improved, the left buttock wound is slightly smaller, per patient bleeds easily, completed ceftin, the right foot redness is less, he is on terbinafine 7/13; left buttock wound about the same perhaps slightly narrower. Area on the left lateral leg continues to narrow. Left dorsal foot slightly smaller right foot about the same. We are using silver alginate on the right foot and Hydrofera Blue to the areas on the left. Unna boot on the left 2 layer compression on the right 7/20; left buttock wound absolutely the same. Area on lateral leg continues to get better. Left dorsal foot require debridement as did the right no major change in the 7/27; left buttock wound the same size necrotic debris over the surface. The area on the lateral leg is closed once again. His left foot looks better right foot about the same although there is some involvement now of the posterior first second toe area. He is still on terbinafine which I have given him for a month, not certain a centimeter major change 06/25/19-All wounds appear to be slightly improved according to report, left buttock wound looks clean, both foot wounds have minimal to no debris the right dorsal foot has minimal slough. We are using Hydrofera Blue to the left and silver alginate to the right foot and ischial wound. 8/10-Wounds all appear to be around the same, the right forefoot distal part has some redness which was not there before, however the wound looks clean and small. Ischial wound looks about the  same with no changes 8/17; his wound on the left lateral calf which was his original chronic venous insufficiency wound remains closed. Since I last saw him the areas on the left dorsal foot right dorsal foot generally appear better but require debridement. The area on his left initial tuberosity appears somewhat larger to me perhaps hyper granulated and bleeds very easily. We have been  using Hydrofera Blue to the left dorsal foot and silver alginate to everything else 8/24; left lateral calf remains closed. The areas on his dorsal feet on the webspace of the first and second toes bilaterally both look better. The area on the left buttock which is the pressure ulcer stage II slightly smaller. I change the dressing to Hydrofera Blue to all areas 8/31; left lateral calf remains closed. The area on his dorsal feet bilaterally look better. Using Hydrofera Blue. Still requiring debridement on the left foot. No change in the left buttock pressure ulcers however 9/14; left lateral calf remains closed. Dorsal feet look quite a bit better than 2 weeks ago. Flaking dry skin also a lot better with the ammonium lactate I gave him 2 weeks ago. The area on the left buttock is improved. He states that his Roho cushion developed a leak and he is getting a new one, in the interim he is offloading this vigorously 9/21; left calf remains closed. Left heel which was a possible DTI looks better this week. He had macerated tissue around the left dorsal foot right foot looks satisfactory and improved left buttock wound. I changed his dressings to his feet to silver alginate bilaterally. Continuing Hydrofera Blue on the left buttock. 9/28 left calf remains closed. Left heel did not develop anything [possible DTI] dry flaking skin on the left dorsal foot. Right foot looks satisfactory. Improved left buttock wound. We are using silver alginate on his feet Hydrofera Blue on the buttock. I have asked him to go back to the  Lotrisone on his feet including the wounds and surrounding areas 10/5; left calf remains closed. The areas on the left and right feet about the same. A lot of this is epithelialized however debris over the remaining open areas. He is using Lotrisone and silver alginate. The area on the left buttock using Hydrofera Blue 10/26. Patient has been out for 3 weeks secondary to Covid concerns. He tested negative but I think his wife tested positive. He comes in today with the left foot substantially worse, right foot about the same. Even more concerning he states that the area on his left buttock closed over but then reopened and is considerably deeper in one aspect than it was before [stage III wound] 11/2; left foot really about the same as last week. Quarter sized wound on the dorsal foot just proximal to the first second toes. Surrounding erythema with areas of denuded epithelium. This is not really much different looking. Did not look like cellulitis this time however. Right foot area about the same.. We have been using silver alginate alginate on his toes Left buttock still substantial irritated skin around the wound which I think looks somewhat better. We have been using Hydrofera Blue here. 11/9; left foot larger than last week and a very necrotic surface. Right foot I think is about the same perhaps slightly smaller. Debris around the circumference also addressed. Unfortunately on the left buttock there is been a decline. Satellite lesions below the major wound distally and now a an additional one posteriorly we have been using Hydrofera Blue but I think this is a pressure issue 11/16; left foot ulcer dorsally again a very adherent necrotic surface. Right foot is about the same. Not much change in the pressure ulcer on his left buttock. 11/30; left foot ulcer dorsally basically the same as when I saw him 2 weeks ago. Very adherent fibrinous debris on the wound surface. Patient reports a lot  of  drainage as well. 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 We have been using Iodoflex, patient changed back to alginate because of the drainage. Area on his right dorsal foot really looks benign with a healthier surface certainly a lot better than on the left. Left buttock wounds all improved using Hydrofera Blue 12/7; left dorsal foot again no improvement. Tightly adherent debris. PCR culture I did last week only showed likely skin contaminant. I have gone ahead and done a punch biopsy of this which is about the last thing in terms of investigations I can think to do. He has known venous insufficiency and venous hypertension and this could be the issue here. The area on the right foot is about the same left buttock slightly worse according to our intake nurse secondary to [facility] Blue sticking to the wound 12/14; biopsy of the left foot that I did last time showed changes that could be related to wound healing/chronic stasis dermatitis phenomenon no neoplasm. We have been using silver alginate to both feet. I change the one on the left today to Sorbact and silver alginate to his other 2 wounds 12/28; the patient arrives with the following problems; Major issue is the dorsal left foot which continues to be a larger deeper wound area. Still with a completely nonviable surface Paradoxically the area mirror image on the right on the right dorsal foot appears to be getting better. He had some loss of dry denuded skin from the lower part of his original wound on the left lateral calf. Some of this area looked a little vulnerable and for this reason we put him in wrap that on this side this week The area on his left buttock is larger. He still has the erythematous circular area which I think is a combination of pressure, sweat. This does not look like cellulitis or fungal dermatitis 11/26/2019; -Dorsal left foot large open wound with depth. Still  debris over the surface. Using Sorbact The area on the dorsal right foot paradoxically has closed over He has a reopening on the left ankle laterally at the base of his original wound that extended up into the calf. This appears clean. The left buttock wound is smaller but with very adherent necrotic debris over the surface. We have been using silver alginate here as well The patient had arterial studies done in 2017. He had biphasic waveforms at the dorsalis pedis and posterior tibial bilaterally. ABI in the left was 1.17. Digit waveforms were dampened. He has slight spasticity in the great toes I do not think a TBI would be possible 1/11; the patient comes in today with a sizable reopening between the first and second toes on the right. This is not exactly in the same location where we have been treating wounds previously. According to our intake nurse this was actually fairly deep but 0.6 cm. The area on the left dorsal foot looks about the same the surface is somewhat cleaner using Sorbact, his MRI is in 2 days. We have not managed yet to get arterial studies. The new reopening on the left lateral calf looks somewhat better using alginate. The left buttock wound is about the same using alginate 1/18; the patient had his ARTERIAL studies which were quite normal. ABI in the right at 1.13 with triphasic/biphasic waveforms on the left ABI 1.06 again with triphasic/biphasic waveforms. It would not have been possible to have done a toe brachial index because of spasticity. We have  been using Sorbac to the left foot alginate to the rest of his wounds on the right foot left lateral calf and left buttock 1/25; arrives in clinic with erythema and swelling of the left forefoot worse over the first MTP area. This extends laterally dorsally and but also posteriorly. Still has an area on the left lateral part of the lower part of his calf wound it is eschared and clearly not closed. Area on the left buttock  still with surrounding irritation and erythema. Right foot surface wound dorsally. The area between the right and first and second toes appears better. 2/1; The left foot wound is about the same. Erythema slightly better I gave him a week of doxycycline empirically Right foot wound is more extensive extending between the toes to the plantar surface Left lateral calf really no open surface on the inferior part of his original wound however the entire area still looks vulnerable Absolutely no improvement in the left buttock wound required debridement. 2/8; the left foot is about the same. Erythema is slightly improved I gave him clindamycin last week. Right foot looks better he is using Lotrimin and silver alginate He has a breakdown in the left lateral calf. Denuded epithelium which I have removed Left buttock about the same were using Hydrofera Blue 2/15; left foot is about the same there is less surrounding erythema. Surface still has tightly adherent debris which I have debriding however not making any progress Right foot has a substantial wound on the medial right second toe between the first and second webspace. Still an open area on the left lateral calf distal area. Buttock wound is about the same 2/22; left foot is about the same less surrounding erythema. Surface has adherent debris. Polymen Ag Right foot area significant wound between the first and second toes. We have been using silver alginate here Left lateral leg polymen Ag at the base of his original venous insufficiency wound Left buttock some improvement here 3/1; Right foot is deteriorating in the first second toe webspace. Larger and more substantial. We have been using silver alginate. Left dorsal foot about the same markedly adherent surface debris using PolyMem Ag Left lateral calf surface debris using PolyMem AG Left buttock is improved again using PolyMem Ag. He is completing his terbinafine. The erythema in the foot  seems better. He has been on this for 2 weeks 3/8; no improvement in any wound area in fact he has a small open area on the dorsal midfoot which is new this week. He has not gotten his foot x-rays yet 3/15; his x-rays were both negative for osteomyelitis of both feet. No major change in any of his wounds on the extremities however his buttock wounds are better. We have been using polymen on the buttocks, left lower leg. Iodoflex on the left foot and silver alginate on the right 3/22; arrives in clinic today with the 2 major issues are the improvement in the left dorsal foot wound which for once actually looks healthy with a nice healthy wound surface without debridement. Using Iodoflex here. Unfortunately on the left lateral calf which is in the distal part of his original wound he came to the clinic here for there was purulent drainage noted some increased breakdown scattered around the original area and a small area proximally. We we are using polymen here will change to silver alginate today. His buttock wound on the left is better and I think the area on the right first second toe webspace is  also improved 3/29; left dorsal foot looks better. Using Iodoflex. Left ankle culture from deterioration last time grew E. coli, Enterobacter and Enterococcus. I will give him a course of cefdinir although that will not cover Enterococcus. The area on the right foot in the webspace of the first and second toe lateral first toe looks better. The area on his buttock is about healed Vascular appointment is on April 21. This is to look at his venous system vis--vis continued breakdown of the wounds on the left including the left lateral leg and left dorsal foot he. He has had previous ablations on this side 4/5; the area between the right first and second toes lateral aspect of the first toe looks better. Dorsal aspect of the left first toe on the left foot also improved. Unfortunately the left lateral lower leg  is larger and there is a second satellite wound superiorly. The usual superficial abrasions on the left buttock overall better but certainly not closed 4/12; the area between the right first and second toes is improved. Dorsal aspect of the left foot also slightly smaller with a vibrant healthy looking surface. No real change in the left lateral leg and the left buttock wound is healed He has an unaffordable co-pay for Apligraf. Appointment with vein and vascular with regards to the left leg venous part of the circulation is on 4/21 4/19; we continue to see improvement in all wound areas. Although this is minor. He has his vascular appointment on 4/21. The area on the left buttock has not reopened although right in the center of this area the skin looks somewhat threatened 4/26; the left buttock is unfortunately reopened. In general his left dorsal foot has a healthy surface and looks somewhat smaller although it was not measured as such. The area between his first and second toe webspace on the right as a small wound against the first toe. The patient saw vascular surgery. The real question I was asking was about the small saphenous vein on the left. He has previously ablated left greater saphenous vein. Nothing further was commented on on the left. Right greater saphenous vein without reflux at the saphenofemoral junction or proximal thigh there was no indication for ablation of the right greater saphenous vein duplex was negative for DVT bilaterally. They did not think there was anything from a vascular surgery point of view that could be offered. They ABIs within normal limits 5/3; only small open area on the left buttock. The area on the left lateral leg which was his original venous reflux is now 2 wounds both which look clean. We are using Iodoflex on the left dorsal foot which looks healthy and smaller. He is down to a very tiny area between the right first and second toes, using  silver alginate 5/10; all of his wounds appear better. We have much better edema control in 4 layer compression on the left. This may be the factor that is allowing the left foot and left lateral calf to heal. He has external compression garments at home 04/14/20-All of his wounds are progressing well, the left forefoot is practically closed, left ischium appears to be about the same, right toe webspace is also smaller. The left lateral leg is about the same, continue using Hydrofera Blue to this, silver alginate to the ischium, Iodoflex to the toe space on the right 6/7; most of his wounds outside of the left buttock are doing well. The area on the left lateral calf and left  dorsal foot are smaller. The area on the right foot in between the first and second toe webspace is barely visible although he still says there is some drainage here is the only reason I did not heal this out. Unfortunately the area on the left buttock almost looks like he has a skin tear from tape. He has open wound and then a large flap of skin that we are trying to get adherence over an area just next to the remaining wound 6/21; 2 week follow-up. I believe is been here for nurse visits. Miraculously the area between his first and second toes on the left dorsal foot is closed over. Still open on the right first second web space. The left lateral calf has 2 open areas. Distally this is more superficial. The proximal area had a little more depth and required debridement of adherent necrotic material. His buttock wound is actually larger we have been using silver alginate here 6/28; the patient's area on the left foot remains closed. Still open wet area between the first and second toes on the right and also extending into the plantar aspect. We have been using silver alginate in this location. He has 2 areas on the left lower leg part of his original long wounds which I think are better. We have been using Hydrofera Blue here.  Hydrofera Blue to the left buttock which is stable 7/12; left foot remains closed. Left ankle is closed. May be a small area between his right first and second toes the only truly open area is on the left buttock. We have been using Hydrofera Blue here 7/19; patient arrives with marked deterioration especially in the left foot and ankle. We did not put him in a compression wrap on the left last week in fact he wore his juxta lite stockings on either side although he does not have an underlying stocking. He has a reopening on the left dorsal foot, left lateral ankle and a new area on the right dorsal ankle. More worrisome is the degree of erythema on the left foot extending on the lateral foot into the lateral lower leg on the left 7/26; the patient had erythema and drainage from the lateral left ankle last week. Culture of this grew MRSA resistant to doxycycline and clindamycin which are the 2 antibiotics we usually use with this patient who has multiple antibiotic allergies including linezolid, trimethoprim sulfamethoxazole. I had give him an empiric doxycycline and he comes in the area certainly looks somewhat better although it is blotchy in his lower leg. He has not been systemically unwell. He has had areas on the left dorsal foot which is a reopening, chronic wounds on the left lateral ankle. Both of these I think are secondary to chronic venous insufficiency. The area between his first and second toes is closed as far as I can tell. He had a new wrap injury on the right dorsal ankle last week. Finally he has an area on the left buttock. We have been using silver alginate to everything except the left buttock we are using Hydrofera Blue 06/30/20-Patient returns at 1 week, has been given a sample dose pack of NUZYRA which is a tetracycline derivative [omadacycline], patient has completed those, we have been using silver alginate to almost all the wounds except the left ischium where we are using  Hydrofera Blue all of them look better 8/16; since I last saw the patient he has been doing well. The area on the left buttock, left lateral  ankle and left foot are all closed today. He has completed the [place] I gave him last time and tolerated this well. He still has open areas on the right dorsal ankle and in the right first second toe area which we are using silver alginate. 8/23; we put him in his bilateral external compression stockings last week as he did not have anything open on either leg except for concerning area between the right first and second toe. He comes in today with an area on the left dorsal foot slightly more proximal than the original wound, the left lateral foot but this is actually a continuation of the area he had on the left lateral ankle from last time. As well he is opened up on the left buttock again. 8/30; comes in today with things looking a lot better. The area on the left lower ankle has closed down as has the left foot but with eschar in both areas. The area on the dorsal right ankle is also epithelialized. Very little remaining of the left buttock wound. We have been using silver alginate on all wound areas 9/13; the area in the first second toe webspace on the right has fully epithelialized. He still has some vulnerable epithelium on the right and the ankle and the dorsal foot. He notes weeping. He is using his juxta lite stocking. On the left again the left dorsal foot is closed left lateral ankle is closed. We went to the juxta lite stocking here as well. Still vulnerable in the left buttock although only 2 small open areas remain here 9/27; 2-week follow-up. We did not look at his left leg but the patient says everything is closed. He is a bit disturbed by the amount of edema in his left foot he is using juxta lite stockings but asking about over the toes stockings which would be 30/40, will talk to him next time. According to him there is no open wound on  either the left foot or the left ankle/calf He has an open area on the dorsal right calf which I initially point a wrap injury. He has superficial remaining wound on the left ischial tuberosity been using silver alginate although he says this sticks to the wound 10/5; we gave him 2-week follow-up but he called yesterday expressing some concerns about his right foot right ankle and the left buttock. He came in early. There is still no open areas on the left leg and that still in his juxta lite stocking 10/11; he only has 1 small area on the left buttock that remains measuring millimeters 1 mm. Still has the same irritated skin in this area. We recommended zinc oxide when this eventually closes and pressure relief is meticulously is he can do this. He still has an area on the dorsal part of his right first through third toes which is a bit irritated and still open and on the dorsal ankle near the crease of the ankle. We have been using silver alginate and using his own stocking. He has nothing open on the left leg or foot Electronic Signature(s) Signed: 09/01/2020 5:03:49 PM By: Linton Ham MD Entered By: Linton Ham on 09/01/2020 08:31:53 -------------------------------------------------------------------------------- Physical Exam Details Patient Name: Date of Service: Hannold, [name] 09/01/2020 7:30 A M Medical Record Number: [id number] Patient Account Number: [id number] Date of Birth/Sex: Treating RN: [birth year] (34 y.o. [name] Primary Care Provider: [place], [place] Other Clinician: Referring Provider: Treating Provider/Extender: Dutch Gray, [name]: [phone]  Constitutional Sitting or standing Blood Pressure is within target range for patient.. Pulse regular and within target range for patient.Marland Kitchen Respirations regular, non-labored and within target range.. Temperature is normal and within the target range for the patient.Marland Kitchen Appears in no  distress. Cardiovascular Pedal pulses are palpable. Edema is well controlled. Notes Wound exam Left buttock only 1 tiny millimeter by millimeter open area. Same irritated skin from pressure friction and moisture. There is nothing open on the left foot or leg. I did not look at this. He has the area on the right dorsal foot just proximal to the toes on the dorsal ankle these look better than last week Electronic Signature(s) Signed: 09/01/2020 5:03:49 PM By: Linton Ham MD Entered By: Linton Ham on 09/01/2020 08:33:20 -------------------------------------------------------------------------------- Physician Orders Details Patient Name: Date of Service: Strahle, [name] 09/01/2020 7:30 A M Medical Record Number: [id number] Patient Account Number: [id number] Date of Birth/Sex: Treating RN: 1986-04-02 (34 y.o. [name] Primary Care Provider: [name], [name] Other Clinician: Referring Provider: Treating Provider/Extender: [name]: [phone] Verbal / Phone Orders: No Diagnosis Coding ICD-10 Coding Code Description I87.332 Chronic venous hypertension (idiopathic) with ulcer and inflammation of left lower extremity L97.321 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 to breakdown of skin L97.311 Non-pressure chronic ulcer of right ankle limited to breakdown of skin L97.511 Non-pressure chronic ulcer of other part of right foot limited to breakdown of skin L97.521 Non-pressure chronic ulcer of other part of left foot limited to breakdown of 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 Dressing Change Frequency Wound #38R Right T - Web between 1st and 2nd oe Change Dressing every other day. Wound #41R Left Ischium Change Dressing every other day. Wound #46 Right,Dorsal Ankle Change Dressing every other day. Skin Barriers/Peri-Wound  Care Moisturizing lotion - both legs daily Other: - lotrimen to periwound of right foot webbing 1st / 2nd toe Wound Cleansing May shower and wash wound with soap and water. - on days that dressing is changed Primary Wound Dressing Wound #38R Right T - Web between 1st and 2nd oe Calcium Alginate with Silver Wound #41R Left Ischium Calcium Alginate with Silver Wound #46 Right,Dorsal Ankle Calcium Alginate with Silver Secondary Dressing Wound #38R Right T - Web between 1st and 2nd oe Kerlix/Rolled Gauze Dry Gauze Wound #41R Left Ischium Foam Border - or ABD pad and tape Wound #46 Right,Dorsal Ankle Foam Border Edema Control Elevate legs to the level of the heart or above for 30 minutes daily and/or when sitting, a frequency of: - throughout the day Support Garment 30-40 mm/Hg pressure to: - Juxtalite to both legs daily Off-Loading Low air-loss mattress (Group 2) Roho cushion for wheelchair Turn and reposition every 2 hours - out of wheelchair throughout the day, try to lay on sides, sleep in the bed not the recliner Electronic Signature(s) Signed: 09/01/2020 5:03:49 PM By: Linton Ham MD Signed: 09/01/2020 5:08:17 PM By: Deon Pilling Entered By: Deon Pilling on 09/01/2020 08:22:47 -------------------------------------------------------------------------------- Problem List Details Patient Name: Date of Service: Sanderfer, [name] 09/01/2020 7:30 A M Medical Record Number: [id number] Patient Account Number: [id number] Date of Birth/Sex: Treating RN: [birth year] (34 y.o. [name] Primary Care Provider: [place], [place] Other Clinician: Referring Provider: Treating Provider/Extender: [name]: 243 Active Problems ICD-10 Encounter Code Description Active Date MDM Diagnosis I87.332 Chronic venous hypertension (idiopathic) with ulcer and inflammation of  left 02/25/2020 No Yes lower extremity L97.321 Non-pressure chronic ulcer  of left ankle limited to breakdown of skin 11/26/2019 No Yes L97.311 Non-pressure chronic ulcer of right ankle limited to breakdown of skin 06/09/2020 No Yes L97.511 Non-pressure chronic ulcer of other part of right foot limited to breakdown of 08/05/2016 No Yes skin L97.521 Non-pressure chronic ulcer of other part of left foot limited to breakdown of 07/25/2018 No Yes 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, stage 3 09/17/2019 No Yes 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, complete 01/02/2016 No Yes Inactive Problems ICD-10 Code Description Active Date Inactive Date 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, stage 3 01/02/2016 01/02/2016 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, stage 3 12/05/2017 12/05/2017 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116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 Problematic areas on the plantar aspect of the first second toe web space on the left and the dorsal aspect of the first second toe web space on the right. At one point I felt these were probably related to chronic fungal infections in treated him aggressively for this although we have not made any improvement here. 08/11/17; left lateral leg. Surface continues to improve with the Iodoflex although we are not seeing much improvement in overall wound dimensions. Areas on his plantar left foot and right foot show no improvement. In fact the right foot looks somewhat worse 08/18/17; left lateral leg. We changed to [facility] Blue last week after a prolonged course of Iodoflex which helps get the surface better. It appears that the wound with is improved. Continue with difficult areas on the left dorsal first second and plantar first second on the right 09/01/17; patient arrives in clinic today having had a temperature of 103 yesterday. He was seen in the ER and [facility]. The patient was concerned he could have cellulitis again in the right leg however they diagnosed him with a UTI and he is now on Keflex. He has a history of cellulitis which is been recurrent and difficult but this is been in the left leg, in the past 5 use doxycycline. He does in and out catheterizations at home which are risk factors for UTI 09/08/17; patient will be completing his Keflex this weekend. The erythema on the left leg is considerably better. He has a new wound today on the medial part of the right leg small superficial almost looks like a skin tear. He has worsening of the area on the right dorsal first and second toe. His major area on the  left lateral leg is better. Using Hydrofera Blue on all areas 09/15/17; gradual reduction in width on the long wound in the left lateral leg. No debridement required. He also has wounds on the plantar aspect of his left first second toe web space and on the dorsal aspect of the right first second toe web space. 09/22/17; there continues to be very gradual improvements in the dimensions of the left lateral leg wound. He hasn't round erythematous spot with might be pressure on his wheelchair. There is no evidence obviously of infection no purulence no warmth ooHe has a dry scaled area on the plantar aspect of the left first second toe ooImproved area on the dorsal right first second toe. 09/29/17; left lateral leg wound continues to improve in dimensions mostly with an is still a fairly long but increasingly narrow wound. ooHe has a dry scaled area on the plantar aspect of his left first second toe web space ooIncreasingly concerning area on the dorsal right first second toe. In fact I am concerned today about possible cellulitis around this wound. The areas extending up his second toe and although there is deformities here almost appears  to abut on the nailbed. 10/06/17; left lateral leg wound continues to make very gradual progress. Tissue culture I did from the right first second toe dorsal foot last time grew MRSA and enterococcus which was vancomycin sensitive. This was not sensitive to clindamycin or doxycycline. He is allergic to Zyvox and sulfa we have therefore arrange for him to have dalvance infusion tomorrow. He is had this in the past and tolerated it well 10/20/17; left lateral leg wound continues to make decent progress. This is certainly reduced in terms of with there is advancing epithelialization.ooThe cellulitis in the right foot looks better although he still has a deep wound in the dorsal aspect of the first second toe web space. Plantar left first toe web space on the left  I think is making some progress 10/27/17; left lateral leg wound continues to make decent progress. Advancing epithelialization.using Hydrofera Blue ooThe right first second toe web space wound is better-looking using silver alginate ooImprovement in the left plantar first second toe web space. Again using silver alginate 11/03/17 left lateral leg wound continues to make decent progress albeit slowly. Using Hydrofera Blue ooThe right per second toe web space continues to be a very problematic looking punched out wound. I obtained a piece of tissue for deep culture I did extensively treated this for fungus. It is difficult to imagine that this is a pressure area as the patient states other than going outside he doesn't really wear shoes at home ooThe left plantar first second toe web space looked fairly senescent. Necrotic edges. This required debridement oochange to Hydrofera Blue to all wound areas 11/10/17; left lateral leg wound continues to contract. Using Hydrofera Blue ooOn the right dorsal first second toe web space dorsally. Culture I did of this area last week grew MRSA there is not an easy oral option in this patient was multiple antibiotic allergies or intolerances. This was only a rare culture isolate I'm therefore going to use Bactroban under silver alginate ooOn the left plantar first second toe web space. Debridement is required here. This is also unchanged 11/17/17; left lateral leg wound continues to contract using Hydrofera Blue this is no longer the major issue. ooThe major concern here is the right first second toe web space. He now has an open area going from dorsally to the plantar aspect. There is now wound on the inner lateral part of the first toe. Not a very viable surface on this. There is erythema spreading medially into the forefoot. ooNo major change in the left first second toe plantar wound 11/24/17; left lateral leg wound continues to contract using Hydrofera  Blue. Nice improvement today ooThe right first second toe web space all of this looks a lot less angry than last week. I have given him clindamycin and topical Bactroban for MRSA and terbinafine for the possibility of underlining tinea pedis that I could not control with ketoconazole. Looks somewhat better ooThe area on the plantar left first second toe web space is weeping with dried debris around the wound 12/01/17; left lateral leg wound continues to contract he Hydrofera Blue. It is becoming thinner in terms of with nevertheless it is making good improvement. ooThe right first second toe web space looks less angry but still a large necrotic-looking wounds starting on the plantar aspect of the right foot extending between the toes and now extensively on the base of the right second toe. I gave him clindamycin and topical Bactroban for MRSA anterior benefiting for the possibility of  underlying tinea pedis. Not looking better today ooThe area on the left first/second toe looks better. Debrided of necrotic debris 12/05/17* the patient was worked in urgently today because over the weekend he found blood on his incontinence bad when he woke up. He was found to have an ulcer by his wife who does most of his wound care. He came in today for [place] to look at this. He has not had a history of wounds in his buttocks in spite of his paraplegia. 12/08/17; seen in follow-up today at his usual appointment. He was seen earlier this week and found to have a new wound on his buttock. We also follow him for wounds on the left lateral leg, left first second toe web space and right first second toe web space 12/15/17; we have been using Hydrofera Blue to the left lateral leg which has improved. The right first second toe web space has also improved. Left first second toe web space plantar aspect looks stable. The left buttock has worsened using Santyl. Apparently the buttock has drainage 12/22/17; we have been using  Hydrofera Blue to the left lateral leg which continues to improve now 2 small wounds separated by normal skin. He tells [place] he had a fever up to 100 yesterday he is prone to UTIs but has not noted anything different. He does in and out catheterizations. The area between the first and second toes today does not look good necrotic surface covered with what looks to be purulent drainage and erythema extending into the third toe. I had gotten this to something that I thought look better last time however it is not look good today. He also has a necrotic surface over the buttock wound which is expanded. I thought there might be infection under here so I removed a lot of the surface with a #5 curet though nothing look like it really needed culturing. He is been using Santyl to this area 12/27/17; his original wound on the left lateral leg continues to improve using Hydrofera Blue. I gave him samples of Baxdella although he was unable to take them out of fear for an allergic reaction ["lump in his throat"].the culture I did of the purulent drainage from his second toe last week showed both enterococcus and a set Enterobacter I was also concerned about the erythema on the bottom of his foot although paradoxically although this looks somewhat better today. 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 We've been using silver alginate here. They came up today that he sleeps in a recliner, I'm not sure why but I asked him to stop this 01/03/18; his original wound we've been using Hydrofera Blue is now separated into 2 areas. ooUlcer on his left buttock is better he is off the recliner and sleeping in bed ooFinally both wound areas between his first and second toes also looks some better 01/10/18; his original wound on the left lateral leg is now separated into 2 wounds we've been using Hydrofera Blue ooUlcer on his left  buttock has some drainage. There is a small probing site going into muscle layer superiorly.using silver alginate -He arrives today with a deep tissue injury on the left heel ooThe wound on the dorsal aspect of his first second toe on the left looks a lot betterusing silver alginate ketoconazole ooThe area on the first second toe web space on the right also looks a lot bette  01/17/18; his original wound on the left lateral leg continues to progress using Hydrofera Blue ooUlcer on his left buttock also is smaller surface healthier except for a small probing site going into the muscle layer superiorly. 2.4 cm of tunneling in this area ooDTI on his left heel we have only been offloading. Looks better than last week no threatened open no evidence of infection oothe wound on the dorsal aspect of the first second toe on the left continues to look like it's regressing we have only been using silver alginate and terbinafine orally ooThe area in the first second toe web space on the right also looks to be a lot better using silver alginate and terbinafine I think this was prompted by tinea pedis 01/31/18; the patient was hospitalized in [place] last week apparently for a complicated UTI. He was discharged on cefepime he does in and out catheterizations. In the hospital he was discovered M I don't mild elevation of AST and ALT and the terbinafine was stopped.predictably the pressure ulcer on s his buttock looks betterusing silver alginate. The area on the left lateral leg also is better using Hydrofera Blue. The area between the first and second toes on the left better. First and second toes on the right still substantial but better. Finally the DTI on the left heel has held together and looks like it's resolving 02/07/18-he is here in follow-up evaluation for multiple ulcerations. He has new injury to the lateral aspect of the last issue a pressure ulcer, he states this is from adhesive removal trauma.  He states he has tried multiple adhesive products with no success. All other ulcers appear stable. The left heel DTI is resolving. We will continue with same treatment plan and follow-up next week. 02/14/18; follow-up for multiple areas. ooHe has a new area last week on the lateral aspect of his pressure ulcer more over the posterior trochanter. The original pressure ulcer looks quite stable has healthy granulation. 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 Almost fully epithelialized on the original superior area using Hydrofera Blue ooDTI on the left heel has peeled off this week to reveal a small superficial wound under denuded skin and subcutaneous tissue ooBoth areas between the first and second toes look better including nothing open on the left 02/21/18; ooThe patient's wounds on his left ischial tuberosity and posterior left greater trochanter actually looked better. He has a large area of irritation around the area which I think is contact dermatitis. I am doubtful that this is fungal ooHis original wound on the left lateral calf continues to improve we have been using Hydrofera Blue ooThere is no open area in the left first second toe web space although there is a lot of thick callus ooThe DTI on the left heel required debridement today of necrotic surface eschar and subcutaneous tissue using silver alginate ooFinally the area on the right first second toe webspace continues to contract using silver alginate and ketoconazole 02/28/18 ooLeft ischial tuberosity wounds look better using silver alginate. ooOriginal wound on the left calf only has one small open area left using Hydrofera Blue ooDTI on the left heel required debridement mostly removing skin from around this wound surface. Using silver alginate ooThe areas on the right first/second toe web space using silver  alginate and ketoconazole 03/08/18 on evaluation today patient appears to be doing decently well as best I can tell in regard  to his wounds. This is the first time that I have seen him as he generally is followed by Dr. Dellia Nims. With that being said none of his wounds appear to be infected he does have an area where there is some skin covering what appears to be a new wound on the left dorsal surface of his great toe. This is right at the nail bed. With that being said I do believe that debrided away some of the excess skin can be of benefit in this regard. Otherwise he has been tolerating the dressing changes without complication. 03/14/18; patient arrives today with the multiplicity of wounds that we are following. He has not been systemically unwell ooOriginal wound on the left lateral calf now only has 2 small open areas we've been using Hydrofera Blue which should continue ooThe deep tissue injury on the left heel requires debridement today. We've been using silver alginate ooThe left first second toe and the right first second toe are both are reminiscence what I think was tinea pedis. Apparently some of the callus Surface between the toes was removed last week when it started draining. ooPurulent drainage coming from the wound on the ischial tuberosity on the left. 03/21/18-He is here in follow-up evaluation for multiple wounds. 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 He tolerated debridement. The only change to last week's recommendations is to discontinue antifungal cream between toes. He will follow-up next week 03/28/18; following up for multiple wounds;Concern this week is streaking redness and swelling in the right foot. He is going to need antibiotics for this. 03/31/18; follow-up for right foot cellulitis. Streaking redness and swelling in the right foot on 03/28/18. He has multiple antibiotic intolerances and a  history of MRSA. I put him on clindamycin 300 mg every 6 and brought him in for a quick check. He has an open wound between his first and second toes on the right foot as a potential source. 04/04/18; ooRight foot cellulitis is resolving he is completing clindamycin. This is truly good news ooLeft lateral calf wound which is initial wound only has one small open area inferiorly this is close to healing out. He has compression stockings. We will use Hydrofera Blue right down to the epithelialization of this ooNonviable surface on the left heel which was initially pressure with a DTI. We've been using Hydrofera Blue. I'm going to switch this back to silver alginate ooLeft first second toe/tinea pedis this looks better using silver alginate ooRight first second toe tinea pedis using silver alginate ooLarge pressure ulcers on theLeft ischial tuberosity. Small wound here Looks better. I am uncertain about the surface over the large wound. Using silver alginate 04/11/18; ooCellulitis in the right foot is resolved ooLeft lateral calf wound which was his original wounds still has 2 tiny open areas remaining this is just about closed ooNonviable surface on the left heel is better but still requires debridement ooLeft first second toe/tinea pedis still open using silver alginate ooRight first second toe wound tinea pedis I asked him to go back to using ketoconazole and silver alginate ooLarge pressure ulcers on the left ischial tuberosity this shear injury here is resolved. Wound is smaller. No evidence of infection using silver alginate 04/18/18; ooPatient arrives with an intense area of cellulitis in the right mid lower calf extending into the right heel area. Bright red and warm. Smaller area on the left anterior leg. He has a significant history of MRSA. He  will definitely need antibioticsoodoxycycline ooHe now has 2 open areas on the left ischial tuberosity the original large wound and  now a satellite area which I think was above his initial satellite areas. Not a wonderful surface on this satellite area surrounding erythema which looks like pressure related. ooHis left lateral calf wound again his original wound is just about closed ooLeft heel pressure injury still requiring debridement ooLeft first second toe looks a lot better using silver alginate ooRight first second toe also using silver alginate and ketoconazole cream also looks better 04/20/18; the patient was worked in early today out of concerns with his cellulitis on the right leg. I had started him on doxycycline. This was 2 days ago. His wife was concerned about the swelling in the area. Also concerned about the left buttock. He has not been systemically unwell no fever chills. No nausea vomiting or diarrhea 04/25/18; the patient's left buttock wound is continued to deteriorate he is using Hydrofera Blue. He is still completing clindamycin for the cellulitis on the right leg although all of this looks better. 05/02/18 ooLeft buttock wound still with a lot of drainage and a very tightly adherent fibrinous necrotic surface. He has a deeper area superiorly ooThe left lateral calf wound is still closed ooDTI wound on the left heel necrotic surface especially the circumference using Iodoflex ooAreas between his left first second toe and right first second toe both look better. Dorsally and the right first second toe he had a necrotic surface although at smaller. In using silver alginate and ketoconazole. I did a culture last week which was a deep tissue culture of the reminiscence of the open wound on the right first second toe dorsally. This grew a few Acinetobacter and a few methicillin-resistant staph aureus. Nevertheless the area actually this week looked better. I didn't feel the need to specifically address this at least in terms of systemic antibiotics. 05/09/18; wounds are measuring larger more drainage per  our intake. We are using Santyl covered with alginate on the large superficial buttock wounds, Iodosorb on the left heel, ketoconazole and silver alginate to the dorsal first and second toes bilaterally. 05/16/18; ooThe area on his left buttock better in some aspects although the area superiorly over the ischial tuberosity required an extensive debridement.using Santyl ooLeft heel appears stable. Using Iodoflex ooThe areas between his first and second toes are not bad however there is spreading erythema up the dorsal aspect of his left foot this looks like cellulitis again. He is insensate the erythema is really very brilliant.o Erysipelas He went to see an allergist days ago because he was itching part of this he had lab work done. This showed a white count of 15.1 with 70% neutrophils. Hemoglobin of 11.4 and a platelet count of 659,000. Last white count we had in Epic was a 2-1/2 years ago which was 25.9 but he was ill at the time. He was able to show me some lab work that was done by his primary physician the pattern is about the same. 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 But prompted me to go ahead and do x-rays of both feet and the pelvis rule out osteomyelitis. He also had a comprehensive metabolic panel this was reasonably normal his albumin was 3.7 liver function tests BUN/creatinine all normal 05/23/18; x-rays of both his feet from last week were negative for underlying pulmonary abnormality. The x-ray of his pelvis however showed mild irregularity  in the left ischial which may represent some early osteomyelitis. The wound in the left ischial continues to get deeper clearly now exposed muscle. Each week necrotic surface material over this area. Whereas the rest of the wounds do not look so bad. ooThe left ischial wound we have been using Santyl and calcium alginate ooT the left heel surface necrotic debris using Iodoflex o ooThe left  lateral leg is still healed ooAreas on the left dorsal foot and the right dorsal foot are about the same. There is some inflammation on the left which might represent contact dermatitis, fungal dermatitis I am doubtful cellulitis although this looks better than last week 05/30/18; CT scan done at Hospital did not show any osteomyelitis or abscess. Suggested the possibility of underlying cellulitis although I don't see a lot of evidence of this at the bedside ooThe wound itself on the left buttock/upper thigh actually looks somewhat better. No debridement ooLeft heel also looks better no debridement continue Iodoflex ooBoth dorsal first second toe spaces appear better using Lotrisone. Left still required debridement 06/06/18; ooIntake reported some purulent looking drainage from the left gluteal wound. Using Santyl and calcium alginate ooLeft heel looks better although still a nonviable surface requiring debridement ooThe left dorsal foot first/second webspace actually expanding and somewhat deeper. I may consider doing a shave biopsy of this area ooRight dorsal foot first/second webspace appears stable to improved. Using Lotrisone and silver alginate to both these areas 06/13/18 ooLeft gluteal surface looks better. Now separated in the 2 wounds. No debridement required. Still drainage. We'll continue silver alginate ooLeft heel continues to look better with Iodoflex continue this for at least another week ooOf his dorsal foot wounds the area on the left still has some depth although it looks better than last week. We've been using Lotrisone and silver alginate 06/20/18 ooLeft gluteal continues to look better healthy tissue ooLeft heel continues to look better healthy granulation wound is smaller. He is using Iodoflex and his long as this continues continue the Iodoflex ooDorsal right foot looks better unfortunately dorsal left foot does not. There is swelling and erythema of his  forefoot. He had minor trauma to this several days ago but doesn't think this was enough to have caused any tissue injury. Foot looks like cellulitis, we have had this problem before 06/27/18 on evaluation today patient appears to be doing a little worse in regard to his foot ulcer. 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 Both of which would really be his only options for treating this infection. In the past he has been given and effusion of Orbactiv. This is done very well for him in the past again it's one time dosing IV antibiotic therapy. Subsequently I do believe this is something we're gonna need to see about doing at this point in time. Currently his other wounds seem to be doing somewhat better in my pinion I'm pretty happy in that regard. 07/03/18 on evaluation today patient's wounds actually appear to be doing fairly well. He has been tolerating the dressing changes without complication. All in all he seems to be showing signs of improvement. In regard to the antibiotics he has been dealing with infectious disease since I saw him last week as far as getting this scheduled. In the end [facility]'s going [facility]  done this coming Friday. In the meantime he has been continuing to perform the dressing changes in such as previous. There does not appear to be any evidence of infection worsengin at this time. 07/10/18; ooSince I last saw this man 2 weeks ago things have actually improved. IV antibiotics of resulted in less forefoot erythema although there is still some present. He is not systemically unwell ooLeft buttock wounds o2 now have no depth there is increased epithelialization Using silver alginate ooLeft heel still requires debridement using Iodoflex ooLeft dorsal foot still with a sizable wound about  the size of a border but healthy granulation ooRight dorsal foot still with a slitlike area using silver alginate 07/18/18; the patient's cellulitis in the left foot is improved in fact I think it is on its way to resolving. ooLeft buttock wounds o2 both look better although the larger one has hypertension granulation we've been using silver alginate ooLeft heel has some thick circumferential redundant skin over the wound edge which will need to be removed today we've been using Iodoflex ooLeft dorsal foot is still a sizable wound required debridement using silver alginate ooThe right dorsal foot is just about closed only a small open area remains here 07/25/18; left foot cellulitis is resolved ooLeft buttock wounds o2 both look better. Hyper-granulation on the major area ooLeft heel as some debris over the surface but otherwise looks a healthier wound. Using silver collagen ooRight dorsal foot is just about closed 07/31/18; arrives with our intake nurse worried about purulent drainage from the buttock. We had hyper-granulation here last week ooHis buttock wounds o2 continue to look better ooLeft heel some debris over the surface but measuring smaller. ooRight dorsal foot unfortunately has openings between the toes ooLeft foot superficial wound looks less aggravated. 08/07/18 ooButtock wounds continue to look better although some of her granulation and the larger medial wound. silver alginate ooLeft heel continues to look a lot better.silver collagen ooLeft foot superficial wound looks less stable. Requires debridement. He has a new wound superficial area on the foot on the lateral dorsal foot. ooRight foot looks better using silver alginate without Lotrisone 08/14/2018; patient was in the ER last week diagnosed with a UTI. He is now on Cefpodoxime and Macrodantin. ooButtock wounds continued to be smaller. Using silver alginate ooLeft heel continues to look better using silver  collagen ooLeft foot superficial wound looks as though it is improving ooRight dorsal foot area is just about healed. 08/21/2018; patient is completed his antibiotics for his UTI. ooHe has 2 open areas on the buttocks. There is still not closed although the surface looks satisfactory. Using silver alginate ooLeft heel continues to improve using silver collagen ooThe bilateral dorsal foot areas which are at the base of his first and second toes/possible tinea pedis are actually stable on the left but worse on the right. The area on the left required debridement of necrotic surface. After debridement I obtained a specimen for PCR culture. ooThe right dorsal foot which is been just about healed last week is now reopened 08/28/2018; culture done on the left dorsal foot showed coag negative staph both staph epidermidis and Lugdunensis. I think this is worthwhile initiating systemic treatment. I will use doxycycline given his long list of allergies. The area on the left heel slightly improved but still requiring debridement. ooThe large wound on the buttock is just about closed whereas the smaller one is larger. Using silver alginate in this area 09/04/2018; patient is completing his doxycycline for the left  foot although this continues to be a very difficult wound area with very adherent necrotic debris. We are using silver alginate to all his wounds right foot left foot and the small wounds on his buttock, silver collagen on the left heel. 09/11/2018; once again this patient has intense erythema and swelling of the left forefoot. Lesser degrees of erythema in the right foot. He has a long list of allergies and intolerances. I will reinstitute doxycycline. 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 Using silver alginate ooLeft heel also looks better using silver collagen ooUnfortunately both the areas on his feet look worse. The area on the left  first second webspace is now gone through to the plantar part of his foot. The area on the left foot anteriorly is irritated with erythema and swelling in the forefoot. 09/25/2018 ooHis wound on the left plantar heel looks better. Using silver collagen ooThe area on the left buttock 2 small remnant areas. One is closed one is still open. Using silver alginate ooThe areas between both his first and second toes look worse. This in spite of long-standing antifungal therapy with ketoconazole and silver alginate which should have antifungal activity ooHe has small areas around his original wound on the left calf one is on the bottom of the original scar tissue and one superiorly both of these are small and superficial but again given wound history in this site this is worrisome 10/02/2018 ooLeft plantar heel continues to gradually contract using silver collagen ooLeft buttock wound is unchanged using silver alginate ooThe areas on his dorsal feet between his first and second toes bilaterally look about the same. 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 Small open area here. We have been using silver alginate 10/09/2018; ooLeft plantar heel continues to look healthy and contract. No debridement is required ooLeft buttock slightly smaller with a tape injury wound just below which was new this week ooDorsal feet somewhat improved I have been using clindamycin ooLeft lateral looks lower extremity the actual open area looks worse although a lot of this is epithelialized. I am going to change to silver collagen today He has a lot more swelling in the right leg although this is not pitting not red and not particularly warm there is a lot of spasm in the right leg usually indicative of people with paralysis of some  underlying discomfort. We have reviewed his vascular status from 2017 he had a left greater saphenous vein ablation. I wonder about referring him back to vascular surgery if the area on the left leg continues to deteriorate. 10/16/2018 in today for follow-up and management of multiple lower extremity ulcers. His left Buttock wound is much lower smaller and almost closed completely. The wound to the left ankle has began to reopen with Epithelialization and some adherent slough. He has multiple new areas to the left foot and leg. The left dorsal foot without much improvement. Wound present between left great webspace and 2nd toe. Erythema and edema present right leg. Right LE ultrasound obtained on 10/10/18 was negative for DVT . 10/23/2018; ooLeft buttock is closed over. Still dry macerated skin but there is no open wound. I suspect this is chronic pressure/moisture ooLeft lateral calf is quite a bit worse than when I saw this last. There  is clearly drainage here he has macerated skin into the left plantar heel. We will change the primary dressing to alginate ooLeft dorsal foot has some improvement in overall wound area. Still using clindamycin and silver alginate ooRight dorsal foot about the same as the left using clindamycin and silver alginate ooThe erythema in the right leg has resolved. He is DVT rule out was negative ooLeft heel pressure area required debridement although the wound is smaller and the surface is health 10/26/2018 ooThe patient came back in for his nurse check today predominantly because of the drainage coming out of the left lateral leg with a recent reopening of his original wound on the left lateral calf. He comes in today with a large amount of surrounding erythema around the wound extending from the calf into the ankle and even in the area on the dorsal foot. He is not systemically unwell. He is not febrile. Nevertheless this looks like cellulitis. We have been using  silver alginate to the area. I changed him to a regular visit and I am going to prescribe him doxycycline. The rationale here is a long list of medication intolerances and a history of MRSA. I did not see anything that I thought would provide a valuable culture 10/30/2018 ooFollow-up from his appointment 4 days ago with really an extensive area of cellulitis in the left calf left lateral ankle and left dorsal foot. I put him on doxycycline. He has a long list of medication allergies which are true allergy reactions. 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 In any case he is a lot better today. The erythema especially in the anterior and lateral left calf is better. He still has left ankle erythema. He also is complaining about increasing edema in the right leg we have only been using Kerlix Coban and he has been doing the wraps at home. Finally he has a spotty rash on the medial part of his upper left calf which looks like folliculitis or perhaps wrap occlusion type injury. Small superficial macules not pustules 11/06/18 patient arrives today with again a considerable degree of erythema around the wound on the left lateral calf extending into the dorsal ankle and dorsal foot. This is a lot worse than when I saw this last week. He is on doxycycline really with not a lot of improvement. He has not been systemically unwell Wounds on the; left heel actually looks improved. Original area on the left foot and proximity to the first and second toes looks about the same. He has superficial areas on the dorsal foot, anterior calf and then the reopening of his original wound on the left lateral calf which looks about the same ooThe only area he has on the right is the dorsal webspace first and second which is smaller. ooHe has a large area of dry erythematous skin on the left buttock small open area here. 11/13/2018; the patient arrives  in much better condition. The erythema around the wound on the left lateral calf is a lot better. Not sure whether this was the clindamycin or the TCA and ketoconazole or just in the improvement in edema control [stasis dermatitis]. In any case this is a lot better. The area on the left heel is very small and just about resolved using silver collagen we have been using silver alginate to the areas on his dorsal feet 11/20/2018; his wounds include the left lateral calf, left heel, dorsal aspects  of both feet just proximal to the first second webspace. He is stable to slightly improved. I did not think any changes to his dressings were going to be necessary 11/27/2018 he has a reopening on the left buttock which is surrounded by what looks like tinea or perhaps some other form of dermatitis. The area on the left dorsal foot has some erythema around it I have marked this area but I am not sure whether this is cellulitis or not. Left heel is not closed. Left calf the reopening is really slightly longer and probably worse 1/13; in general things look better and smaller except for the left dorsal foot. Area on the left heel is just about closed, left buttock looks better only a small wound remains in the skin looks better [using Lotrisone] 1/20; the area on the left heel only has a few remaining open areas here. Left lateral calf about the same in terms of size, left dorsal foot slightly larger right lateral foot still not closed. The area on the left buttock has no open wound and the surrounding skin looks a lot better 1/27; the area on the left heel is closed. Left lateral calf better but still requiring extensive debridements. The area on his left buttock is closed. He still has the open areas on the left dorsal foot which is slightly smaller in the right foot which is slightly expanded. We have been using Iodoflex on these areas as well 2/3; left heel is closed. Left lateral calf still requiring  debridement using Iodoflex there is no open area on his left buttock however he has dry scaly skin over a large area of this. Not really responding well to the Lotrisone. Finally the areas on his dorsal feet at the level of the first second webspace are slightly smaller on the right and about the same on the left. Both of these vigorously debrided with Anasept and gauze 2/10; left heel remains closed he has dry erythematous skin over the left buttock but there is no open wound here. Left lateral leg has come in and with. Still requiring debridement we have been using Iodoflex here. Finally the area on the left dorsal foot and right dorsal foot are really about the same extremely dry callused fissured areas. He does not yet have a dermatology appointment 2/17; left heel remains closed. He has a new open area on the left buttock. The area on the left lateral calf is bigger longer and still covered in necrotic debris. No major change in his foot areas bilaterally. I am awaiting for a dermatologist to look on this. We have been using ketoconazole I do not know that this is been doing any good at all. 2/24; left heel remains closed. The left buttock wound that was new reopening last week looks better. The left lateral calf appears better also although still requires debridement. The major area on his foot is the left first second also requiring debridement. We have been putting Prisma on all wounds. I do not believe that the ketoconazole has done too much good for his feet. He will use Lotrisone I am going to give him a 2-week course of terbinafine. We still do not have a dermatology appointment 3/2 left heel remains closed however there is skin over bone in this area I pointed this out to him today. The left buttock wound is epithelialized but still does not look completely stable. The area on the left leg required debridement were using silver collagen here. With  regards to his feet we changed to  Lotrisone last week and silver alginate. 3/9; left heel remains closed. Left buttock remains closed. The area on the right foot is essentially closed. The left foot remains unchanged. Slightly smaller on the left lateral calf. Using silver collagen to both of these areas 3/16-Left heel remains closed. Area on right foot is closed. Left lateral calf above the lateral malleolus open wound requiring debridement with easy bleeding. Left dorsal wound proximal to first toe also debrided. Left ischial area open new. Patient has been using Prisma with wrapping every 3 days. Dermatology appointment is apparently tomorrow.Patient has completed his terbinafine 2-week course with some apparent improvement according to him, there is still flaking and dry skin in his foot on the left 3/23; area on the right foot is reopened. The area on the left anterior foot is about the same still a very necrotic adherent surface. He still has the area on the left leg and reopening is on the left buttock. He apparently saw dermatology although I do not have a note. According to the patient who is usually fairly well informed they did not have any good ideas. Put him on oral terbinafine which she is been on before. 3/30; using silver collagen to all wounds. Apparently his dermatologist put him on doxycycline and rifampin presumably some culture grew staph. I do not have this result. He remains on terbinafine although I have used terbinafine on him before 4/6; patient has had a fairly substantial reopening on the right foot between the first and second toes. He is finished his terbinafine and I believe is on doxycycline and rifampin still as prescribed by dermatology. We have been using silver collagen to all his wounds although the patient reports that he thinks silver alginate does better on the wounds on his buttock. 4/13; the area on his left lateral calf about the same size but it did not require debridement. ooLeft dorsal  foot just proximal to the webspace between the first and second toes is about the same. Still nonviable surface. I note some superficial bronze discoloration of the dorsal part of his foot ooRight dorsal foot just proximal to the first and second toes also looks about the same. I still think there may be the same discoloration I noted above on the left ooLeft buttock wound looks about the same 4/20; left lateral calf appears to be gradually contracting using silver collagen. 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 The left dorsal foot wound is debrided of tightly adherent necrotic debris and really cleans up quite nicely. The right area is worse with expansion. I did not debride this it is now over the base of the second toe ooThe area on his left buttock is smaller no debridement is required using silver collagen 5/4; left calf continues to make good progress. ooHe arrives with erythema around the wounds on his dorsal foot which even extends to the plantar aspect. Very concerning for coexistent infection. He is finished the erythromycin I gave him for possible erythrasma this does not seem to have helped. ooThe area on the left foot is about the same base of the dorsal toes ooIs area on the buttock looks improved on the left 5/11; left calf and left buttock continued to make good progress. Left foot is about the same to slightly improved. ooMajor problem is on the right foot. He has not had an x-ray. Deep tissue culture I did last week showed both  Enterobacter and E. coli. I did not change the doxycycline I put him on empirically although neither 1 of these were plated to doxycycline. He arrives today with the erythema looking worse on both the dorsal and plantar foot. Macerated skin on the bottom of the foot. he has not been systemically unwell 5/18-Patient returns at 1 week, left calf wound appears to be making some progress,  left buttock wound appears slightly worse than last time, left foot wound looks slightly better, right foot redness is marginally better. X-ray of both feet show no air or evidence of osteomyelitis. Patient is finished his Omnicef and terbinafine. He continues to have macerated skin on the bottom of the left foot as well as right 5/26; left calf wound is better, left buttock wound appears to have multiple small superficial open areas with surrounding macerated skin. X-rays that I did last time showed no evidence of osteomyelitis in either foot. He is finished cefdinir and doxycycline. I do not think that he was on terbinafine. He continues to have a large superficial open area on the right foot anterior dorsal and slightly between the first and second toes. I did send him to dermatology 2 months ago or so wondering about whether they would do a fungal scraping. I do not believe they did but did do a culture. We have been using silver alginate to the toe areas, he has been using antifungals at home topically either ketoconazole or Lotrisone. We are using silver collagen on the left foot, silver alginate on the right, silver collagen on the left lateral leg and silver alginate on the left buttock 6/1; left buttock area is healed. We have the left dorsal foot, left lateral leg and right dorsal foot. We are using silver alginate to the areas on both feet and silver collagen to the area on his left lateral calf 6/8; the left buttock apparently reopened late last week. He is not really sure how this happened. He is tolerating the terbinafine. Using silver alginate to all wounds 6/15; left buttock wound is larger than last week but still superficial. ooCame in the clinic today with a report of purulence from the left lateral leg I did not identify any infection ooBoth areas on his dorsal feet appear to be better. He is tolerating the terbinafine. Using silver alginate to all wounds 6/22; left buttock is  about the same this week, left calf quite a bit better. His left foot is about the same however he comes in with erythema and warmth in the right forefoot once again. Culture that I gave him in the beginning of May showed Enterobacter and E. coli. I gave him doxycycline and things seem to improve although neither 1 of these organisms was specifically plated. 6/29; left buttock is larger and dry this week. Left lateral calf looks to me to be improved. Left dorsal foot also somewhat improved right foot completely unchanged. The erythema on the right foot is still present. He is completing the Ceftin dinner that I gave him empirically [see discussion above.) 7/6 - All wounds look to be stable and perhaps improved, the left buttock wound is slightly smaller, per patient bleeds easily, completed ceftin, the right foot redness is less, he is on terbinafine 7/13; left buttock wound about the same perhaps slightly narrower. Area on the left lateral leg continues to narrow. Left dorsal foot slightly smaller right foot about the same. We are using silver alginate on the right foot and Hydrofera Blue to the areas  on the left. Unna boot on the left 2 layer compression on the right 7/20; left buttock wound absolutely the same. Area on lateral leg continues to get better. Left dorsal foot require debridement as did the right no major change in the 7/27; left buttock wound the same size necrotic debris over the surface. The area on the lateral leg is closed once again. His left foot looks better right foot about the same although there is some involvement now of the posterior first second toe area. He is still on terbinafine which I have given him for a month, not certain a centimeter major change 06/25/19-All wounds appear to be slightly improved according to report, left buttock wound looks clean, both foot wounds have minimal to no debris the right dorsal foot has minimal slough. We are using Hydrofera Blue to the  left and silver alginate to the right foot and ischial wound. 8/10-Wounds all appear to be around the same, the right forefoot distal part has some redness which was not there before, however the wound looks clean and small. Ischial wound looks about the same with no changes 8/17; his wound on the left lateral calf which was his original chronic venous insufficiency wound remains closed. Since I last saw him the areas on the left dorsal foot right dorsal foot generally appear better but require debridement. The area on his left initial tuberosity appears somewhat larger to me perhaps hyper granulated and bleeds very easily. We have been using Hydrofera Blue to the left dorsal foot and silver alginate to everything else 8/24; left lateral calf remains closed. The areas on his dorsal feet on the webspace of the first and second toes bilaterally both look better. The area on the left buttock which is the pressure ulcer stage II slightly smaller. I change the dressing to Hydrofera Blue to all areas 8/31; left lateral calf remains closed. The area on his dorsal feet bilaterally look better. Using Hydrofera Blue. Still requiring debridement on the left foot. No change in the left buttock pressure ulcers however 9/14; left lateral calf remains closed. Dorsal feet look quite a bit better than 2 weeks ago. Flaking dry skin also a lot better with the ammonium lactate I gave him 2 weeks ago. The area on the left buttock is improved. He states that his Roho cushion developed a leak and he is getting a new one, in the interim he is offloading this vigorously 9/21; left calf remains closed. Left heel which was a possible DTI looks better this week. He had macerated tissue around the left dorsal foot right foot looks satisfactory and improved left buttock wound. I changed his dressings to his feet to silver alginate bilaterally. Continuing Hydrofera Blue on the left buttock. 9/28 left calf remains closed. Left heel  did not develop anything [possible DTI] dry flaking skin on the left dorsal foot. Right foot looks satisfactory. Improved left buttock wound. We are using silver alginate on his feet Hydrofera Blue on the buttock. I have asked him to go back to the Lotrisone on his feet including the wounds and surrounding areas 10/5; left calf remains closed. The areas on the left and right feet about the same. A lot of this is epithelialized however debris over the remaining open areas. He is using Lotrisone and silver alginate. The area on the left buttock using Hydrofera Blue 10/26. Patient has been out for 3 weeks secondary to Covid concerns. He tested negative but I think his wife tested positive. He  comes in today with the left foot substantially worse, right foot about the same. Even more concerning he states that the area on his left buttock closed over but then reopened and is considerably deeper in one aspect than it was before [stage III wound] 11/2; left foot really about the same as last week. Quarter sized wound on the dorsal foot just proximal to the first second toes. Surrounding erythema with areas of denuded epithelium. This is not really much different looking. Did not look like cellulitis this time however. ooRight foot area about the same.. We have been using silver alginate alginate on his toes ooLeft buttock still substantial irritated skin around the wound which I think looks somewhat better. We have been using Hydrofera Blue here. 11/9; left foot larger than last week and a very necrotic surface. Right foot I think is about the same perhaps slightly smaller. Debris around the circumference also addressed. Unfortunately on the left buttock there is been a decline. Satellite lesions below the major wound distally and now a an additional one posteriorly we have been using Hydrofera Blue but I think this is a pressure issue 11/16; left foot ulcer dorsally again a very adherent necrotic surface.  Right foot is about the same. Not much change in the pressure ulcer on his left buttock. 11/30; left foot ulcer dorsally basically the same as when I saw him 2 weeks ago. Very adherent fibrinous debris on the wound surface. Patient reports a lot of drainage as well. 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 We have been using Iodoflex, patient changed back to alginate because of the drainage. Area on his right dorsal foot really looks benign with a healthier surface certainly a lot better than on the left. Left buttock wounds all improved using Hydrofera Blue 12/7; left dorsal foot again no improvement. Tightly adherent debris. PCR culture I did last week only showed likely skin contaminant. I have gone ahead and done a punch biopsy of this which is about the last thing in terms of investigations I can think to do. He has known venous insufficiency and venous hypertension and this could be the issue here. The area on the right foot is about the same left buttock slightly worse according to our intake nurse secondary to [facility] Blue sticking to the wound 12/14; biopsy of the left foot that I did last time showed changes that could be related to wound healing/chronic stasis dermatitis phenomenon no neoplasm. We have been using silver alginate to both feet. I change the one on the left today to Sorbact and silver alginate to his other 2 wounds 12/28; the patient arrives with the following problems; ooMajor issue is the dorsal left foot which continues to be a larger deeper wound area. Still with a completely nonviable surface ooParadoxically the area mirror image on the right on the right dorsal foot appears to be getting better. ooHe had some loss of dry denuded skin from the lower part of his original wound on the left lateral calf. Some of this area looked a little vulnerable and for this reason we put him in wrap that on this side this  week ooThe area on his left buttock is larger. He still has the erythematous circular area which I think is a combination of pressure, sweat. This does not look like cellulitis or fungal dermatitis 11/26/2019; -Dorsal left foot large open wound with depth. Still debris over the surface. Using Sorbact ooThe area on  the dorsal right foot paradoxically has closed over [facility] has a reopening on the left ankle laterally at the base of his original wound that extended up into the calf. This appears clean. ooThe left buttock wound is smaller but with very adherent necrotic debris over the surface. We have been using silver alginate here as well The patient had arterial studies done in 2017. He had biphasic waveforms at the dorsalis pedis and posterior tibial bilaterally. ABI in the left was 1.17. Digit waveforms were dampened. He has slight spasticity in the great toes I do not think a TBI would be possible 1/11; the patient comes in today with a sizable reopening between the first and second toes on the right. This is not exactly in the same location where we have been treating wounds previously. According to our intake nurse this was actually fairly deep but 0.6 cm. The area on the left dorsal foot looks about the same the surface is somewhat cleaner using Sorbact, his MRI is in 2 days. We have not managed yet to get arterial studies. The new reopening on the left lateral calf looks somewhat better using alginate. The left buttock wound is about the same using alginate 1/18; the patient had his ARTERIAL studies which were quite normal. ABI in the right at 1.13 with triphasic/biphasic waveforms on the left ABI 1.06 again with triphasic/biphasic waveforms. It would not have been possible to have done a toe brachial index because of spasticity. We have been using Sorbac to the left foot alginate to the rest of his wounds on the right foot left lateral calf and left buttock 1/25; arrives in clinic with  erythema and swelling of the left forefoot worse over the first MTP area. This extends laterally dorsally and but also posteriorly. Still has an area on the left lateral part of the lower part of his calf wound it is eschared and clearly not closed. ooArea on the left buttock still with surrounding irritation and erythema. ooRight foot surface wound dorsally. The area between the right and first and second toes appears better. 2/1; ooThe left foot wound is about the same. Erythema slightly better I gave him a week of doxycycline empirically ooRight foot wound is more extensive extending between the toes to the plantar surface ooLeft lateral calf really no open surface on the inferior part of his original wound however the entire area still looks vulnerable ooAbsolutely no improvement in the left buttock wound required debridement. 2/8; the left foot is about the same. Erythema is slightly improved I gave him clindamycin last week. ooRight foot looks better he is using Lotrimin and silver alginate ooHe has a breakdown in the left lateral calf. Denuded epithelium which I have removed ooLeft buttock about the same were using Hydrofera Blue 2/15; left foot is about the same there is less surrounding erythema. Surface still has tightly adherent debris which I have debriding however not making any progress ooRight foot has a substantial wound on the medial right second toe between the first and second webspace. ooStill an open area on the left lateral calf distal area. ooButtock wound is about the same 2/22; left foot is about the same less surrounding erythema. Surface has adherent debris. Polymen Ag Right foot area significant wound between the first and second toes. We have been using silver alginate here Left lateral leg polymen Ag at the base of his original venous insufficiency wound ooLeft buttock some improvement here 3/1; ooRight foot is deteriorating in the first second  toe  webspace. Larger and more substantial. We have been using silver alginate. ooLeft dorsal foot about the same markedly adherent surface debris using PolyMem Ag ooLeft lateral calf surface debris using PolyMem AG ooLeft buttock is improved again using PolyMem Ag. ooHe is completing his terbinafine. The erythema in the foot seems better. He has been on this for 2 weeks 3/8; no improvement in any wound area in fact he has a small open area on the dorsal midfoot which is new this week. He has not gotten his foot x-rays yet 3/15; his x-rays were both negative for osteomyelitis of both feet. No major change in any of his wounds on the extremities however his buttock wounds are better. We have been using polymen on the buttocks, left lower leg. Iodoflex on the left foot and silver alginate on the right 3/22; arrives in clinic today with the 2 major issues are the improvement in the left dorsal foot wound which for once actually looks healthy with a nice healthy wound surface without debridement. Using Iodoflex here. Unfortunately on the left lateral calf which is in the distal part of his original wound he came to the clinic here for there was purulent drainage noted some increased breakdown scattered around the original area and a small area proximally. We we are using polymen here will change to silver alginate today. His buttock wound on the left is better and I think the area on the right first second toe webspace is also improved 3/29; left dorsal foot looks better. Using Iodoflex. Left ankle culture from deterioration last time grew E. coli, Enterobacter and Enterococcus. I will give him a course of cefdinir although that will not cover Enterococcus. The area on the right foot in the webspace of the first and second toe lateral first toe looks better. The area on his buttock is about healed Vascular appointment is on April 21. This is to look at his venous system vis--vis continued breakdown of  the wounds on the left including the left lateral leg and left dorsal foot he. He has had previous ablations on this side 4/5; the area between the right first and second toes lateral aspect of the first toe looks better. Dorsal aspect of the left first toe on the left foot also improved. Unfortunately the left lateral lower leg is larger and there is a second satellite wound superiorly. The usual superficial abrasions on the left buttock overall better but certainly not closed 4/12; the area between the right first and second toes is improved. Dorsal aspect of the left foot also slightly smaller with a vibrant healthy looking surface. No real change in the left lateral leg and the left buttock wound is healed He has an unaffordable co-pay for Apligraf. Appointment with vein and vascular with regards to the left leg venous part of the circulation is on 4/21 4/19; we continue to see improvement in all wound areas. Although this is minor. He has his vascular appointment on 4/21. The area on the left buttock has not reopened although right in the center of this area the skin looks somewhat threatened 4/26; the left buttock is unfortunately reopened. In general his left dorsal foot has a healthy surface and looks somewhat smaller although it was not measured as such. The area between his first and second toe webspace on the right as a small wound against the first toe. The patient saw vascular surgery. The real question I was asking was about the small saphenous vein on the  left. He has previously ablated left greater saphenous vein. Nothing further was commented on on the left. Right greater saphenous vein without reflux at the saphenofemoral junction or proximal thigh there was no indication for ablation of the right greater saphenous vein duplex was negative for DVT bilaterally. They did not think there was anything from a vascular surgery point of view that could be offered. They ABIs within normal  limits 5/3; only small open area on the left buttock. The area on the left lateral leg which was his original venous reflux is now 2 wounds both which look clean. We are using Iodoflex on the left dorsal foot which looks healthy and smaller. He is down to a very tiny area between the right first and second toes, using silver alginate 5/10; all of his wounds appear better. We have much better edema control in 4 layer compression on the left. This may be the factor that is allowing the left foot and left lateral calf to heal. He has external compression garments at home 04/14/20-All of his wounds are progressing well, the left forefoot is practically closed, left ischium appears to be about the same, right toe webspace is also smaller. The left lateral leg is about the same, continue using Hydrofera Blue to this, silver alginate to the ischium, Iodoflex to the toe space on the right 6/7; most of his wounds outside of the left buttock are doing well. The area on the left lateral calf and left dorsal foot are smaller. The area on the right foot in between the first and second toe webspace is barely visible although he still says there is some drainage here is the only reason I did not heal this out. ooUnfortunately the area on the left buttock almost looks like he has a skin tear from tape. He has open wound and then a large flap of skin that we are trying to get adherence over an area just next to the remaining wound 6/21; 2 week follow-up. I believe is been here for nurse visits. Miraculously the area between his first and second toes on the left dorsal foot is closed over. Still open on the right first second web space. The left lateral calf has 2 open areas. Distally this is more superficial. The proximal area had a little more depth and required debridement of adherent necrotic material. His buttock wound is actually larger we have been using silver alginate here 6/28; the patient's area on the left  foot remains closed. Still open wet area between the first and second toes on the right and also extending into the plantar aspect. We have been using silver alginate in this location. He has 2 areas on the left lower leg part of his original long wounds which I think are better. We have been using Hydrofera Blue here. Hydrofera Blue to the left buttock which is stable 7/12; left foot remains closed. Left ankle is closed. May be a small area between his right first and second toes the only truly open area is on the left buttock. We have been using Hydrofera Blue here 7/19; patient arrives with marked deterioration especially in the left foot and ankle. We did not put him in a compression wrap on the left last week in fact he wore his juxta lite stockings on either side although he does not have an underlying stocking. He has a reopening on the left dorsal foot, left lateral ankle and a new area on the right dorsal ankle. More worrisome  is the degree of erythema on the left foot extending on the lateral foot into the lateral lower leg on the left 7/26; the patient had erythema and drainage from the lateral left ankle last week. Culture of this grew MRSA resistant to doxycycline and clindamycin which are the 2 antibiotics we usually use with this patient who has multiple antibiotic allergies including linezolid, trimethoprim sulfamethoxazole. I had give him an empiric doxycycline and he comes in the area certainly looks somewhat better although it is blotchy in his lower leg. He has not been systemically unwell. He has had areas on the left dorsal foot which is a reopening, chronic wounds on the left lateral ankle. Both of these I think are secondary to chronic venous insufficiency. The area between his first and second toes is closed as far as I can tell. He had a new wrap injury on the right dorsal ankle last week. Finally he has an area on the left buttock. We have been using silver alginate to  everything except the left buttock we are using Hydrofera Blue 06/30/20-Patient returns at 1 week, has been given a sample dose pack of NUZYRA which is a tetracycline derivative [omadacycline], patient has completed those, we have been using silver alginate to almost all the wounds except the left ischium where we are using Hydrofera Blue all of them look better 8/16; since I last saw the patient he has been doing well. The area on the left buttock, left lateral ankle and left foot are all closed today. He has completed the [place] I gave him last time and tolerated this well. He still has open areas on the right dorsal ankle and in the right first second toe area which we are using silver alginate. 8/23; we put him in his bilateral external compression stockings last week as he did not have anything open on either leg except for concerning area between the right first and second toe. He comes in today with an area on the left dorsal foot slightly more proximal than the original wound, the left lateral foot but this is actually a continuation of the area he had on the left lateral ankle from last time. As well he is opened up on the left buttock again. 8/30; comes in today with things looking a lot better. The area on the left lower ankle has closed down as has the left foot but with eschar in both areas. The area on the dorsal right ankle is also epithelialized. Very little remaining of the left buttock wound. We have been using silver alginate on all wound areas 9/13; the area in the first second toe webspace on the right has fully epithelialized. He still has some vulnerable epithelium on the right and the ankle and the dorsal foot. He notes weeping. He is using his juxta lite stocking. On the left again the left dorsal foot is closed left lateral ankle is closed. We went to the juxta lite stocking here as well. ooStill vulnerable in the left buttock although only 2 small open areas remain here 9/27;  2-week follow-up. We did not look at his left leg but the patient says everything is closed. He is a bit disturbed by the amount of edema in his left foot he is using juxta lite stockings but asking about over the toes stockings which would be 30/40, will talk to him next time. According to him there is no open wound on either the left foot or the left ankle/calf He has  an open area on the dorsal right calf which I initially point a wrap injury. He has superficial remaining wound on the left ischial tuberosity been using silver alginate although he says this sticks to the wound 10/5; we gave him 2-week follow-up but he called yesterday expressing some concerns about his right foot right ankle and the left buttock. He came in early. There is still no open areas on the left leg and that still in his juxta lite stocking 10/11; he only has 1 small area on the left buttock that remains measuring millimeters 1 mm. Still has the same irritated skin in this area. We recommended zinc oxide when this eventually closes and pressure relief is meticulously is he can do this. He still has an area on the dorsal part of his right first through third toes which is a bit irritated and still open and on the dorsal ankle near the crease of the ankle. We have been using silver alginate and using his own stocking. He has nothing open on the left leg or foot Objective Constitutional Sitting or standing Blood Pressure is within target range for patient.. Pulse regular and within target range for patient.Marland Kitchen Respirations regular, non-labored and within target range.. Temperature is normal and within the target range for the patient.Marland Kitchen Appears in no distress. Vitals Time Taken: 7:52 AM, Height: 70 in, Weight: 216 lbs, BMI: 31, Temperature: 98.4 F, Pulse: 105 bpm, Respiratory Rate: 18 breaths/min, Blood Pressure: 121/74 mmHg. Cardiovascular Pedal pulses are palpable. Edema is well controlled. General Notes: Wound exam  ooLeft buttock only 1 tiny millimeter by millimeter open area. Same irritated skin from pressure friction and moisture. ooThere is nothing open on the left foot or leg. I did not look at this. ooHe has the area on the right dorsal foot just proximal to the toes on the dorsal ankle these look better than last week Integumentary (Hair, Skin) Wound #38R status is Open. Original cause of wound was Gradually Appeared. The wound is located on the Right T - Web between 1st and 2nd. The wound oe measures 5cm length x 6cm width x 0.1cm depth; 23.562cm^2 area and 2.356cm^3 volume. There is Fat Layer (Subcutaneous Tissue) exposed. There is no tunneling or undermining noted. There is a medium amount of serous drainage noted. The wound margin is distinct with the outline attached to the wound base. There is medium (34-66%) pink granulation within the wound bed. There is a medium (34-66%) amount of necrotic tissue within the wound bed including Adherent Slough. Wound #41R status is Open. Original cause of wound was Gradually Appeared. The wound is located on the Left Ischium. The wound measures 0.1cm length x 0.1cm width x 0.1cm depth; 0.008cm^2 area and 0.001cm^3 volume. There is no tunneling or undermining noted. There is a none present amount of drainage noted. The wound margin is distinct with the outline attached to the wound base. There is large (67-100%) red granulation within the wound bed. There is no necrotic tissue within the wound bed. Wound #46 status is Open. Original cause of wound was Gradually Appeared. The wound is located on the Right,Dorsal Ankle. The wound measures 0.2cm length x 0.2cm width x 0.1cm depth; 0.031cm^2 area and 0.003cm^3 volume. There is Fat Layer (Subcutaneous Tissue) exposed. There is no tunneling or undermining noted. There is a small amount of serosanguineous drainage noted. The wound margin is flat and intact. There is large (67-100%) red granulation within the wound  bed. There is no necrotic tissue within the  wound bed. 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 Dressing Change Frequency: Wound #38R Right T - Web between 1st and 2nd: oe Change Dressing every other day. Wound #41R Left Ischium: Change Dressing every other day. Wound #46 Right,Dorsal Ankle: Change Dressing every other day. Skin Barriers/Peri-Wound Care: Moisturizing lotion - both legs daily Other: - lotrimen to periwound of right foot webbing 1st / 2nd toe Wound Cleansing: May shower and wash wound with soap and water. - on days that dressing is changed Primary Wound Dressing: Wound #38R Right T - Web between 1st and 2nd: oe Calcium Alginate with Silver Wound #41R Left Ischium: Calcium Alginate with Silver Wound #46 Right,Dorsal Ankle: Calcium Alginate with Silver Secondary Dressing: Wound #38R Right T - Web between 1st and 2nd: oe Kerlix/Rolled Gauze Dry Gauze Wound #41R Left Ischium: Foam Border - or ABD pad and tape Wound #46 Right,Dorsal Ankle: Foam Border Edema Control: Elevate legs to the level of the heart or above for 30 minutes daily and/or when sitting, a frequency of: - throughout the day Support Garment 30-40 mm/Hg pressure to: - Juxtalite to both legs daily Off-Loading: Low air-loss mattress (Group 2) Roho cushion for wheelchair Turn and reposition every 2 hours - out of wheelchair throughout the day, try to lay on sides, sleep in the bed not the  recliner #1 I continued with the same dressing on the right dorsal foot and ankle crease. Lotrisone silver alginate using his own stocking for compression the edema here looks to be controlled although I think he has significant venous hypertension 2. Left buttock only a small open area remains here. 3. Follow-up in 2 weeks Electronic Signature(s) Signed: 09/01/2020 5:03:49 PM By: Linton Ham MD Entered By: Linton Ham on 09/01/2020 08:34:14 -------------------------------------------------------------------------------- SuperBill Details Patient Name: Date of Service: [place], [name] 09/01/2020 Medical Record Number: [id number] Patient Account Number: [id number] Date of Birth/Sex: Treating RN: [birth year] (34 y.o. [name] Primary Care Provider: [place], [place] Other Clinician: Referring Provider: Treating Provider/Extender: [name]: 243 Diagnosis Coding ICD-10 Codes Code Description I87.332 Chronic venous hypertension (idiopathic) with ulcer and inflammation of left lower extremity L97.321 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 to breakdown of skin L97.311 Non-pressure chronic ulcer of right ankle limited to breakdown of skin L97.511 Non-pressure chronic ulcer of other part of right foot limited to breakdown of skin L97.521 Non-pressure chronic ulcer of other part of left foot limited to breakdown of 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, 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, complete Facility Procedures CPT4 Code: 42706237 Description: 62831 - WOUND CARE VISIT-LEV 5 EST PT Modifier: Quantity: 1 Physician Procedures : CPT4 Code Description Modifier [id number] - WC PHYS LEVEL 3 - EST PT ICD-10 Diagnosis Description L97.511 Non-pressure chronic ulcer of other part of right 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

## 2020-09-02 NOTE — Progress Notes (Signed)
[name], [name] ([id number]) Visit Report for 09/01/2020 Arrival Information Details Patient Name: Date of Service: [name], [name] 09/01/2020 7:30 [name] Medical Record Number: [id number] Patient Account Number: [id number] Date of Birth/Sex: Treating [name]: [birth year] (34 y.o. [name]) [name] Primary Care [name]: [name] [name] [name]: Treating [name]/Extender: [name] in Treatment: 2 Visit Information History Since Last Visit All ordered tests and consults were completed: No Patient Arrived: Wheel Chair Added or deleted any medications: No Arrival Time: 07:52 Any new allergies or adverse reactions: No Accompanied By: self Had [name] fall or experienced change in No Transfer Assistance: None activities of daily living that may affect Patient Identification Verified: Yes risk of falls: Secondary Verification Process Completed: Yes Signs or symptoms of abuse/neglect since last visito No Patient Requires Transmission-Based Precautions: No Hospitalized since last visit: No Patient Has Alerts: Yes Implantable device outside of the clinic excluding No Patient Alerts: R ABI = 1.0 cellular tissue based products placed in the center L ABI = 1.1 since last visit: Has Dressing in Place as Prescribed: Yes Pain Present Now: No Electronic Signature(s) Signed: 09/02/2020 5:49:00 PM By: [name] Entered By: [name] on 09/01/2020 07:52:29 -------------------------------------------------------------------------------- Clinic Level of Care Assessment Details Patient Name: Date of Service: [name], [name] 09/01/2020 7:30 [name] Medical Record Number: [id number] Patient Account Number: [id number] Date of Birth/Sex: Treating [name]: 05-29-1986 (34 y.o. [name] Primary Care Yaniris Braddock: [name] [name] Martasia Talamante: Treating Laurana Magistro/Extender: [name] in Treatment: 70 Clinic Level of  Care Assessment Items TOOL 4 Quantity Score X- 1 0 Use when only an EandM is performed on FOLLOW-UP visit ASSESSMENTS - Nursing Assessment / Reassessment X- 1 10 Reassessment of Co-morbidities (includes updates in patient status) X- 1 5 Reassessment of Adherence to Treatment Plan ASSESSMENTS - Wound and Skin [name] ssessment / Reassessment []  - 0 Simple Wound Assessment / Reassessment - one wound X- 3 5 Complex Wound Assessment / Reassessment - multiple wounds X- 1 10 Dermatologic / Skin Assessment (not related to wound area) ASSESSMENTS - Focused Assessment X- 1 5 Circumferential Edema Measurements - multi extremities X- 1 10 Nutritional Assessment / Counseling / Intervention []  - 0 Lower Extremity Assessment (monofilament, tuning fork, pulses) []  - 0 Peripheral Arterial Disease Assessment (using hand held doppler) ASSESSMENTS - Ostomy and/or Continence Assessment and Care []  - 0 Incontinence Assessment and Management []  - 0 Ostomy Care Assessment and Management (repouching, etc.) PROCESS - Coordination of Care []  - 0 Simple Patient / Family Education for ongoing care X- 1 20 Complex (extensive) Patient / Family Education for ongoing care X- 1 10 Staff obtains Programmer, systems, Records, T Results / Process Orders est []  - 0 Staff telephones HHA, Nursing Homes / Clarify orders / etc []  - 0 Routine Transfer to another Facility (non-emergent condition) []  - 0 Routine Hospital Admission (non-emergent condition) []  - 0 New Admissions / Biomedical engineer / Ordering NPWT Apligraf, etc. , []  - 0 Emergency Hospital Admission (emergent condition) []  - 0 Simple Discharge Coordination X- 1 15 Complex (extensive) Discharge Coordination PROCESS - Special Needs []  - 0 Pediatric / Minor Patient Management []  - 0 Isolation Patient Management []  - 0 Hearing / Language / Visual special needs []  - 0 Assessment of Community assistance (transportation, D/C planning, etc.) []  -  0 Additional assistance / Altered mentation []  - 0 Support Surface(s) Assessment (bed, cushion, seat, etc.) INTERVENTIONS - Wound Cleansing / Measurement []  -  0 Simple Wound Cleansing - one wound X- 3 5 Complex Wound Cleansing - multiple wounds X- 1 5 Wound Imaging (photographs - any number of wounds) []  - 0 Wound Tracing (instead of photographs) []  - 0 Simple Wound Measurement - one wound X- 3 5 Complex Wound Measurement - multiple wounds INTERVENTIONS - Wound Dressings X - Small Wound Dressing one or multiple wounds 2 10 X- 1 15 Medium Wound Dressing one or multiple wounds []  - 0 Large Wound Dressing one or multiple wounds []  - 0 Application of Medications - topical []  - 0 Application of Medications - injection INTERVENTIONS - Miscellaneous []  - 0 External ear exam []  - 0 Specimen Collection (cultures, biopsies, blood, body fluids, etc.) []  - 0 Specimen(s) / Culture(s) sent or taken to Lab for analysis []  - 0 Patient Transfer (multiple staff / Civil Service fast streamer / Similar devices) []  - 0 Simple Staple / Suture removal (25 or less) []  - 0 Complex Staple / Suture removal (26 or more) []  - 0 Hypo / Hyperglycemic Management (close monitor of Blood Glucose) []  - 0 Ankle / Brachial Index (ABI) - do not check if billed separately X- 1 5 Vital Signs Has the patient been seen at the hospital within the last three years: Yes Total Score: 175 Level Of Care: New/Established - Level 5 Electronic Signature(s) Signed: 09/01/2020 5:08:17 PM By: [name] Entered By: [name] -------------------------------------------------------------------------------- Encounter Discharge Information Details Patient Name: Date of Service: [name], [name] 09/01/2020 7:30 [name] Medical Record Number: [id number] Patient Account Number: [id number] Date of Birth/Sex: Treating [name]: [birth year] (34 y.o. [name] Primary Care Kadyn Chovan: [place], [place] [name]  [name]: Referring Aurea Aronov: Treating Dillyn Menna/Extender: [name] in Treatment: [phone] Encounter Discharge Information Items Discharge Condition: Stable Ambulatory Status: Wheelchair Discharge Destination: Home Transportation: Private Auto Accompanied By: self Schedule Follow-up Appointment: Yes Clinical Summary of Care: Electronic Signature(s) Signed: 09/01/2020 5:08:17 PM By: [name] Entered By: [name] on 09/01/2020 08:31:22 -------------------------------------------------------------------------------- Lower Extremity Assessment Details Patient Name: Date of Service: [name], [name] 09/01/2020 7:30 [name] Medical Record Number: [id number] Patient Account Number: [id number] Date of Birth/Sex: Treating [name]: 04-10-1986 (34 y.o. [name] Primary Care Ladaysha Soutar: [place], [place] [name] Kirk Sampley: Treating Beonka Amesquita/Extender: [name] in Treatment: 243 Edema Assessment Assessed: [Left: No] [Right: No] Edema: [Left: Ye] [Right: s] Calf Left: Right: Point of Measurement: 33 cm From Medial Instep 31.7 cm Ankle Left: Right: Point of Measurement: 10 cm From Medial Instep 23.3 cm Electronic Signature(s) Signed: 09/02/2020 5:49:00 PM By: [name] Entered By: [name] on 09/01/2020 07:53:09 -------------------------------------------------------------------------------- Multi Wound Chart Details Patient Name: Date of Service: [name], [name] 09/01/2020 7:30 [name] Medical Record Number: [id number] Patient Account Number: [id number] Date of Birth/Sex: Treating [name]: 03-12-1986 (34 y.o. [name] Primary Care Belva Koziel: [name] [name] Marcelo Ickes: Treating Joevon Holliman/Extender: [name] in Treatment: 243 Vital Signs Height(in): 70 Pulse(bpm): 105 Weight(lbs): 216 Blood Pressure(mmHg): 121/74 Body Mass Index(BMI):  31 Temperature(F): 98.4 Respiratory Rate(breaths/min): 18 Photos: [38R:No Photos Right T - Web between 1st and 2nd Left Ischium oe] [41R:No Photos] [46:No Photos Right, Dorsal Ankle] Wound Location: [38R:Gradually Appeared] [41R:Gradually Appeared] [46:Gradually Appeared] Wounding Event: [38R:Inflammatory] [41R:Pressure Ulcer] [46:Abrasion] Primary Etiology: [38R:Sleep Apnea, Hypertension, Paraplegia Sleep Apnea, Hypertension, Paraplegia Sleep Apnea, Hypertension, Paraplegia] Comorbid History: [38R:11/30/2019] [41R:03/16/2020] [46:08/13/2020] Date Acquired: [38R:39] [41R:24] [46:2] [name] of Treatment: [38R:Open] [41R:Open] [46:Open] Wound Status: [38R:Yes] [  41R:Yes] [46:No] Wound Recurrence: [38R:No] [41R:Yes] [46:No] Clustered Wound: [38R:5x6x0.1] [41R:0.1x0.1x0.1] [46:0.2x0.2x0.1] Measurements L x W x D (cm) [38R:23.562] [41R:0.008] [46:0.031] [name] (cm) : rea [38R:2.356] [41R:0.001] [46:0.003] Volume (cm) : [38R:-7040.00%] [41R:99.60%] [46:50.80%] % Reduction in [name] rea: [38R:-919.90%] [41R:99.40%] [46:50.00%] % Reduction in Volume: [38R:Full Thickness Without Exposed] [41R:Category/Stage II] [46:Full Thickness Without Exposed] Classification: [38R:Support Structures Medium] [41R:None Present] [46:Support Structures Small] Exudate Amount: [38R:Serous] [41R:[name]] [46:Serosanguineous] Exudate Type: [38R:amber] [41R:[name]] [46:red, brown] Exudate Color: [38R:Distinct, outline attached] [41R:Distinct, outline attached] [46:Flat and Intact] Wound Margin: [38R:Medium (34-66%)] [41R:Large (67-100%)] [46:Large (67-100%)] Granulation Amount: [38R:Pink] [41R:Red] [46:Red] Granulation Quality: [38R:Medium (34-66%)] [41R:None Present (0%)] [46:None Present (0%)] Necrotic Amount: [38R:Fat Layer (Subcutaneous Tissue): Yes Fascia: No] [46:Fat Layer (Subcutaneous Tissue): Yes] Exposed Structures: [38R:Fascia: No Tendon: No Muscle: No Joint: No Bone: No Small (1-33%)] [41R:Fat Layer (Subcutaneous Tissue): No  Fascia: No Tendon: No Muscle: No Joint: No Bone: No Large (67-100%)] [46:Tendon: No Muscle: No Joint: No Bone: No Medium (34-66%)] Treatment Notes Electronic Signature(s) Signed: 09/01/2020 5:03:49 PM By: Linton Ham MD Signed: 09/01/2020 5:10:40 PM By: Levan Hurst [name], BSN Entered By: Linton Ham on 09/01/2020 08:30:26 -------------------------------------------------------------------------------- Multi-Disciplinary Care Plan Details Patient Name: Date of Service: Zachman, [name] 09/01/2020 7:30 [name] Medical Record Number: [id number] Patient Account Number: [id number] Date of Birth/Sex: Treating [name]: June 19, 1986 (34 y.o. [name] Primary Care Imogen [name]: [name] [name] Joie Hipps: Treating Tiyana Galla/Extender: [name] in Treatment: 243 Active Inactive Wound/Skin Impairment Nursing Diagnoses: Impaired tissue integrity Knowledge deficit related to ulceration/compromised skin integrity Goals: Patient/caregiver will verbalize understanding of skin care regimen Date Initiated: 01/05/2016 Target Resolution Date: 10/03/2020 Goal Status: Active Ulcer/skin breakdown will have [name] volume reduction of 30% by week 4 Date Initiated: 01/05/2016 Date Inactivated: 12/22/2017 Target Resolution Date: 01/19/2018 Unmet Reason: complex wounds, Goal Status: Unmet infection Interventions: Assess patient/caregiver ability to obtain necessary supplies Assess ulceration(s) every visit Provide education on ulcer and skin care Notes: Electronic Signature(s) Signed: 09/01/2020 5:10:40 PM By: Levan Hurst [name], BSN Entered By: Levan Hurst on 09/01/2020 07:53:31 -------------------------------------------------------------------------------- Pain Assessment Details Patient Name: Date of Service: [name], [name] 09/01/2020 7:30 [name] Medical Record Number: [id number] Patient Account Number: [id number] Date of Birth/Sex: Treating  [name]: 08/20/1986 (34 y.o. [name] Primary Care Rosalva Neary: [place], [place] [name] Yashvi Jasinski: Treating Monzerrath Mcburney/Extender: [name] in Treatment: 243 Active Problems Location of Pain Severity and Description of Pain Patient Has Paino No Site Locations Pain Management and Medication Current Pain Management: Electronic Signature(s) Signed: 09/02/2020 5:49:00 PM By: [name] Entered By: [name] on 09/01/2020 07:53:03 -------------------------------------------------------------------------------- Patient/Caregiver Education Details Patient Name: Date of Service: [name], [name] 10/11/2021andnbsp7:30 [name] Medical Record Number: [id number] Patient Account Number: [id number] Date of Birth/Gender: Treating [name]: Mar 13, 1986 (34 y.o. [name] Primary Care Physician: Janine Limbo [name] Physician: Treating Physician/Extender: [name] in Treatment: 25 Education Assessment Education Provided To: Patient Education Topics Provided Wound/Skin Impairment: Methods: Explain/Verbal Responses: State content correctly Electronic Signature(s) Signed: 09/01/2020 5:10:40 PM By: Levan Hurst [name], BSN Entered By: Levan Hurst on 09/01/2020 07:53:45 -------------------------------------------------------------------------------- Wound Assessment Details Patient Name: Date of Service: [name], [name] 09/01/2020 7:30 [name] Medical Record Number: [id number] Patient Account Number: [id number] Date of Birth/Sex: Treating [name]: June 29, 1986 (34 y.o. [name] Primary Care Amelio Brosky: [name] [name] Broadus Costilla: Treating Beyonca Wisz/Extender: [name] in Treatment: 243 Wound Status Wound Number: [id number] Primary  Etiology: Inflammatory Wound Location: Right T - Web between 1st and 2nd oe Wound Status: Open Wounding Event: Gradually  Appeared Comorbid History: Sleep Apnea, Hypertension, Paraplegia Date Acquired: 11/30/2019 [name] Of Treatment: 39 Clustered Wound: No Photos Photo Uploaded By: Mikeal Hawthorne on 09/02/2020 13:14:19 Wound Measurements Length: (cm) 5 Width: (cm) 6 Depth: (cm) 0.1 Area: (cm) 23.562 Volume: (cm) 2.356 % Reduction in Area: -7040% % Reduction in Volume: -919.9% Epithelialization: Small (1-33%) Tunneling: No Undermining: No Wound Description Classification: Full Thickness Without Exposed Support Structures Wound Margin: Distinct, outline attached Exudate Amount: Medium Exudate Type: Serous Exudate Color: amber Foul Odor After Cleansing: No Slough/Fibrino No Wound Bed Granulation Amount: Medium (34-66%) Exposed Structure Granulation Quality: Pink Fascia Exposed: No Necrotic Amount: Medium (34-66%) Fat Layer (Subcutaneous Tissue) Exposed: Yes Necrotic Quality: Adherent Slough Tendon Exposed: No Muscle Exposed: No Joint Exposed: No Bone Exposed: No Treatment Notes Wound #38R (Right Toe - Web between 1st and 2nd) 1. Cleanse With Wound Cleanser 2. Periwound Care Antifungal cream Moisturizing lotion 4. Secondary Dressing Dry Gauze Roll Gauze 5. Secured With Medipore tape 6. Support Layer Applied Compression garment Notes Development worker, international aid) Signed: 09/02/2020 5:49:00 PM By: [name] Entered By: [name] on 09/01/2020 08:02:46 -------------------------------------------------------------------------------- Wound Assessment Details Patient Name: Date of Service: [name], [name] 09/01/2020 7:30 [name] Medical Record Number: [id number] Patient Account Number: [id number] Date of Birth/Sex: Treating [name]: 11-19-1986 (34 y.o. [name] Primary Care Marlee Trentman: [name] [name] Destyne Goodreau: Treating Finnlee Guarnieri/Extender: [name] in Treatment: 243 Wound Status Wound Number: 41R Primary Etiology:  Pressure Ulcer Wound Location: Left Ischium Wound Status: Open Wounding Event: Gradually Appeared Comorbid History: Sleep Apnea, Hypertension, Paraplegia Date Acquired: 03/16/2020 [name] Of Treatment: 24 Clustered Wound: Yes Photos Photo Uploaded By: Mikeal Hawthorne on 09/02/2020 13:14:37 Wound Measurements Length: (cm) 0.1 % Width: (cm) 0.1 % Depth: (cm) 0.1 Ep Area: (cm) 0.008 T Volume: (cm) 0.001 U Reduction in Area: 99.6% Reduction in Volume: 99.4% ithelialization: Large (67-100%) unneling: No ndermining: No Wound Description Classification: Category/Stage II F Wound Margin: Distinct, outline attached S Exudate Amount: None Present oul Odor After Cleansing: No lough/Fibrino No Wound Bed Granulation Amount: Large (67-100%) Exposed Structure Granulation Quality: Red Fascia Exposed: No Necrotic Amount: None Present (0%) Fat Layer (Subcutaneous Tissue) Exposed: No Tendon Exposed: No Muscle Exposed: No Joint Exposed: No Bone Exposed: No Treatment Notes Wound #41R (Left Ischium) 1. Cleanse With Wound Cleanser 3. Primary Dressing Applied Calcium Alginate Ag 4. Secondary Dressing Foam Border Dressing 5. Secured With Office manager) Signed: 09/01/2020 5:08:17 PM By: [name] Entered By: [name] on 09/01/2020 08:22:21 -------------------------------------------------------------------------------- Wound Assessment Details Patient Name: Date of Service: [name], [name] 09/01/2020 7:30 [name] Medical Record Number: [id number] Patient Account Number: [id number] Date of Birth/Sex: Treating [name]: 12-29-1985 (34 y.o. [name]) [name] Primary Care Abe Schools: [place], [place] [name] Kristyn Obyrne: Treating Stevee Valenta/Extender: [name] in Treatment: 243 Wound Status Wound Number: 46 Primary Etiology: Abrasion Wound Location: Right, Dorsal Ankle Wound Status: Open Wounding Event: Gradually  Appeared Comorbid History: Sleep Apnea, Hypertension, Paraplegia Date Acquired: 08/13/2020 [name] Of Treatment: 2 Clustered Wound: No Photos Photo Uploaded By: Mikeal Hawthorne on 09/02/2020 13:14:19 Wound Measurements Length: (cm) 0.2 Width: (cm) 0.2 Depth: (cm) 0.1 Area: (cm) 0.031 Volume: (cm) 0.003 % Reduction in Area: 50.8% % Reduction in Volume: 50% Epithelialization: Medium (34-66%) Tunneling: No Undermining: No Wound Description Classification: Full Thickness Without Exposed Support Structures Wound  Margin: Flat and Intact Exudate Amount: Small Exudate Type: Serosanguineous Exudate Color: red, brown Foul Odor After Cleansing: No Slough/Fibrino Yes Wound Bed Granulation Amount: Large (67-100%) Exposed Structure Granulation Quality: Red Fascia Exposed: No Necrotic Amount: None Present (0%) Fat Layer (Subcutaneous Tissue) Exposed: Yes Tendon Exposed: No Muscle Exposed: No Joint Exposed: No Bone Exposed: No Treatment Notes Wound #46 (Right, Dorsal Ankle) 1. Cleanse With Wound Cleanser 3. Primary Dressing Applied Calcium Alginate Ag 4. Secondary Dressing Foam Border Dressing 5. Secured With Office manager) Signed: 09/02/2020 5:49:00 PM By: [name] Entered By: [name] on 09/01/2020 08:03:30 -------------------------------------------------------------------------------- Vitals Details Patient Name: Date of Service: [name], [name] 09/01/2020 7:30 [name] Medical Record Number: [id number] Patient Account Number: [id number] Date of Birth/Sex: Treating [name]: 1986/07/26 (33 y.o. [name]) [name] Primary Care Kandise Riehle: [place], [name] [name] Kypton [place]ham: Treating Landry Lookingbill/Extender: [name] in Treatment: 243 Vital Signs Time Taken: 07:52 Temperature (F): 98.4 Height (in): 70 Pulse (bpm): 105 Weight (lbs): 216 Respiratory Rate (breaths/min): 18 Body Mass Index (BMI):  31 Blood Pressure (mmHg): 121/74 Reference Range: 80 - 120 mg / dl Electronic Signature(s) Signed: 09/02/2020 5:49:00 PM By: [name] Entered By: [name] on 09/01/2020 07:52:55

## 2020-09-10 ENCOUNTER — Encounter: Payer: Self-pay | Admitting: Psychiatry

## 2020-09-15 ENCOUNTER — Other Ambulatory Visit: Payer: Self-pay

## 2020-09-15 ENCOUNTER — Encounter (HOSPITAL_BASED_OUTPATIENT_CLINIC_OR_DEPARTMENT_OTHER): Payer: BC Managed Care – PPO | Admitting: Internal Medicine

## 2020-09-15 DIAGNOSIS — I87332 Chronic venous hypertension (idiopathic) with ulcer and inflammation of left lower extremity: Secondary | ICD-10-CM | POA: Diagnosis not present

## 2020-09-15 DIAGNOSIS — L89224 Pressure ulcer of left hip, stage 4: Secondary | ICD-10-CM | POA: Diagnosis not present

## 2020-09-15 DIAGNOSIS — Z9081 Acquired absence of spleen: Secondary | ICD-10-CM | POA: Diagnosis not present

## 2020-09-15 DIAGNOSIS — Z89422 Acquired absence of other left toe(s): Secondary | ICD-10-CM | POA: Diagnosis not present

## 2020-09-15 DIAGNOSIS — Z8614 Personal history of Methicillin resistant Staphylococcus aureus infection: Secondary | ICD-10-CM | POA: Diagnosis not present

## 2020-09-15 DIAGNOSIS — L97311 Non-pressure chronic ulcer of right ankle limited to breakdown of skin: Secondary | ICD-10-CM | POA: Diagnosis not present

## 2020-09-15 DIAGNOSIS — L97321 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 to breakdown of skin: Secondary | ICD-10-CM | POA: Diagnosis not present

## 2020-09-15 DIAGNOSIS — G8221 Paraplegia, complete: Secondary | ICD-10-CM | POA: Diagnosis not present

## 2020-09-15 DIAGNOSIS — L97521 Non-pressure chronic ulcer of other part of left foot limited to breakdown of skin: Secondary | ICD-10-CM | POA: Diagnosis not present

## 2020-09-15 DIAGNOSIS — I872 Venous insufficiency (chronic) (peripheral): Secondary | ICD-10-CM | POA: Diagnosis not present

## 2020-09-15 DIAGNOSIS — Z87891 Personal history of nicotine dependence: Secondary | ICD-10-CM | POA: Diagnosis not present

## 2020-09-15 DIAGNOSIS — L97511 Non-pressure chronic ulcer of other part of right foot limited to breakdown of skin: Secondary | ICD-10-CM | POA: Diagnosis not present

## 2020-09-15 DIAGNOSIS — L89323 Pressure ulcer of left buttock, stage 3: Secondary | ICD-10-CM | POA: Diagnosis not present

## 2020-09-15 DIAGNOSIS — L97512 Non-pressure chronic ulcer of other part of right foot with fat layer exposed: Secondary | ICD-10-CM | POA: Diagnosis not present

## 2020-09-15 NOTE — Progress Notes (Signed)
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 once on [name] nurse visit. He was also reduced to 2 layer compression I'm not exactly sure of the issue here. 07/21/17; better surface today after 1 week of Iodoflex. Significant cellulitis that we treated last week also better. [Doxycycline] 07/28/17 better surface today with now 2 [name] of Iodoflex.  Significant cellulitis treated with doxycycline. He has now completed the doxycycline and he is back to his usual degree of chronic venous inflammation/stasis dermatitis. He reminds me he has had ablations surgery here 08/04/17; continued improvement with Iodoflex to the left lateral leg wound in terms of the surface of the wound although the dimensions are better. 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 Problematic areas on the plantar aspect of the first second toe web space on the left and the dorsal aspect of the first second toe web space on the right. At one point I felt these were probably related to chronic fungal infections in treated him aggressively for this although we have not made any improvement here. 08/11/17; left lateral leg. Surface continues to improve with the Iodoflex although we are not seeing much improvement in overall wound dimensions. Areas on his plantar left foot and right foot show no improvement. In fact the right foot looks somewhat worse 08/18/17; left lateral leg. We changed to [facility] Blue last week after [name] prolonged course of Iodoflex which helps get the surface better. It appears that the wound with is improved. Continue with difficult areas on the left dorsal first second and plantar first second on the right  09/01/17; patient arrives in clinic today having had [name] temperature of 103 yesterday. He was seen in the ER and [facility]. The patient was concerned he could have cellulitis again in the right leg however they diagnosed him with [name] UTI and he is now on Keflex. He has [name] history of cellulitis which is been recurrent and difficult but this is been in the left leg, in the past 5 use doxycycline. He does in and out catheterizations at home which are risk factors for UTI 09/08/17; patient will be completing his Keflex this weekend. The erythema on the left leg is considerably better. He has [name] new  wound today on the medial part of the right leg small superficial almost looks like [name] skin tear. He has worsening of the area on the right dorsal first and second toe. His major area on the left lateral leg is better. Using Hydrofera Blue on all areas 09/15/17; gradual reduction in width on the long wound in the left lateral leg. No debridement required. He also has wounds on the plantar aspect of his left first second toe web space and on the dorsal aspect of the right first second toe web space. 09/22/17; there continues to be very gradual improvements in the dimensions of the left lateral leg wound. He hasn't round erythematous spot with might be pressure on his wheelchair. There is no evidence obviously of infection no purulence no warmth He has [name] dry scaled area on the plantar aspect of the left first second toe Improved area on the dorsal right first second toe. 09/29/17; left lateral leg wound continues to improve in dimensions mostly with an is still [name] fairly long but increasingly narrow wound. He has [name] dry scaled area on the plantar aspect of his left first second toe web space Increasingly concerning area on the dorsal right first second toe. In fact I am concerned today about possible cellulitis around this wound. The areas extending up his second toe and although there is deformities here almost appears to abut on the nailbed. 10/06/17; left lateral leg wound continues to make very gradual progress. Tissue culture I did from the right first second toe dorsal foot last time grew MRSA and enterococcus which was vancomycin sensitive. This was not sensitive to clindamycin or doxycycline. He is allergic to Zyvox and sulfa we have therefore arrange for him to have dalvance infusion tomorrow. He is had this in the past and tolerated it well 10/20/17; left lateral leg wound continues to make decent progress. This is certainly reduced in terms of with there is advancing epithelialization.The  cellulitis in the right foot looks better although he still has [name] deep wound in the dorsal aspect of the first second toe web space. Plantar left first toe web space on the left I think is making some progress 10/27/17; left lateral leg wound continues to make decent progress. Advancing epithelialization.using Hydrofera Blue The right first second toe web space wound is better-looking using silver alginate Improvement in the left plantar first second toe web space. Again using silver alginate 11/03/17 left lateral leg wound continues to make decent progress albeit slowly. Using [facility] The right per second toe web space continues to be [name] very problematic looking punched out wound. I obtained [name] piece of tissue for deep culture I did extensively treated this for fungus. It is difficult to imagine that this is [name] pressure area as the patient states other than going outside he doesn't really wear shoes at home The  left plantar first second toe web space looked fairly senescent. Necrotic edges. This required debridement change to [facility] Blue to all wound areas 11/10/17; left lateral leg wound continues to contract. Using Hydrofera Blue On the right dorsal first second toe web space dorsally. Culture I did of this area last week grew MRSA there is not an easy oral option in this patient was multiple antibiotic allergies or intolerances. This was only [name] rare culture isolate I'm therefore going to use Bactroban under silver alginate On the left plantar first second toe web space. Debridement is required here. This is also unchanged 11/17/17; left lateral leg wound continues to contract using Hydrofera Blue this is no longer the major issue. The major concern here is the right first second toe web space. He now has an open area going from dorsally to the plantar aspect. There is now wound on the inner lateral part of the first toe. Not [name] very viable surface on this. There is erythema spreading  medially into the forefoot. No major change in the left first second toe plantar wound 11/24/17; left lateral leg wound continues to contract using Hydrofera Blue. Nice improvement today The right first second toe web space all of this looks [name] lot less angry than last week. I have given him clindamycin and topical Bactroban for MRSA and terbinafine for the possibility of underlining tinea pedis that I could not control with ketoconazole. Looks somewhat better The area on the plantar left first second toe web space is weeping with dried debris around the wound 12/01/17; left lateral leg wound continues to contract he Hydrofera Blue. It is becoming thinner in terms of with nevertheless it is making good improvement. The right first second toe web space looks less angry but still [name] large necrotic-looking wounds starting on the plantar aspect of the right foot extending between the toes and now extensively on the base of the right second toe. I gave him clindamycin and topical Bactroban for MRSA anterior benefiting for the possibility of underlying tinea pedis. Not looking better today The area on the left first/second toe looks better. Debrided of necrotic debris 12/05/17* the patient was worked in urgently today because over the weekend he found blood on his incontinence bad when he woke up. He was found to have an ulcer by his wife who does most of his wound care. He came in today for [place] to look at this. He has not had [name] history of wounds in his buttocks in spite of his paraplegia. 12/08/17; seen in follow-up today at his usual appointment. He was seen earlier this week and found to have [name] new wound on his buttock. We also follow him for wounds on the left lateral leg, left first second toe web space and right first second toe web space 12/15/17; we have been using Hydrofera Blue to the left lateral leg which has improved. The right first second toe web space has also improved. Left first second toe web  space plantar aspect looks stable. The left buttock has worsened using Santyl. Apparently the buttock has drainage 12/22/17; we have been using Hydrofera Blue to the left lateral leg which continues to improve now 2 small wounds separated by normal skin. He tells [place] he had [name] fever up to 100 yesterday he is prone to UTIs but has not noted anything different. He does in and out catheterizations. The area between the first and second toes today does not look good necrotic surface covered with what looks  to be purulent drainage and erythema extending into the third toe. I had gotten this to something that I thought look better last time however it is not look good today. He also has [name] necrotic surface over the buttock wound which is expanded. I thought there might be infection under here so I removed [name] lot of the surface with [name] #5 curet though nothing look like it really needed culturing. He is been using Santyl to this area 12/27/17; his original wound on the left lateral leg continues to improve using Hydrofera Blue. I gave him samples of Baxdella although he was unable to take them out of fear for an allergic reaction ["lump in his throat"].the culture I did of the purulent drainage from his second toe last week showed both enterococcus and [name] set Enterobacter I was also concerned about the erythema on the bottom of his foot although paradoxically although this looks somewhat better today. 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 We've been using silver alginate here. They came up today that he sleeps in [name] recliner, I'm not sure why but I asked him to stop this 01/03/18; his original wound we've been using Hydrofera Blue is now separated into 2 areas. Ulcer on his left buttock is better he is off the recliner and sleeping in bed Finally both wound areas between his first and second toes also looks some  better 01/10/18; his original wound on the left lateral leg is now separated into 2 wounds we've been using Hydrofera Blue Ulcer on his left buttock has some drainage. There is [name] small probing site going into muscle layer superiorly.using silver alginate -He arrives today with [name] deep tissue injury on the left heel The wound on the dorsal aspect of his first second toe on the left looks [name] lot betterusing silver alginate ketoconazole The area on the first second toe web space on the right also looks [name] lot bette 01/17/18; his original wound on the left lateral leg continues to progress using Hydrofera Blue Ulcer on his left buttock also is smaller surface healthier except for [name] small probing site going into the muscle layer superiorly. 2.4 cm of tunneling in this area DTI on his left heel we have only been offloading. Looks better than last week no threatened open no evidence of infection the wound on the dorsal aspect of the first second toe on the left continues to look like it's regressing we have only been using silver alginate and terbinafine orally The area in the first second toe web space on the right also looks to be [name] lot better using silver alginate and terbinafine I think this was prompted by tinea pedis 01/31/18; the patient was hospitalized in [place] last week apparently for [name] complicated UTI. He was discharged on cefepime he does in and out catheterizations. In the hospital he was discovered M I don't mild elevation of AST and ALT and the terbinafine was stopped.predictably the pressure ulcer on s his buttock looks betterusing silver alginate. The area on the left lateral leg also is better using Hydrofera Blue. The area between the first and second toes on the left better. First and second toes on the right still substantial but better. Finally the DTI on the left heel has held together and looks like it's resolving 02/07/18-he is here in follow-up evaluation for multiple ulcerations.  He has new injury to the lateral aspect of  the last issue [name] pressure ulcer, he states this is from adhesive removal trauma. He states he has tried multiple adhesive products with no success. All other ulcers appear stable. The left heel DTI is resolving. We will continue with same treatment plan and follow-up next week. 02/14/18; follow-up for multiple areas. He has [name] new area last week on the lateral aspect of his pressure ulcer more over the posterior trochanter. The original pressure ulcer looks quite stable has healthy granulation. 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 Almost fully epithelialized on the original superior area using Hydrofera Blue DTI on the left heel has peeled off this week to reveal [name] small superficial wound under denuded skin and subcutaneous tissue Both areas between the first and second toes look better including nothing open on the left 02/21/18; The patient's wounds on his left ischial tuberosity and posterior left greater trochanter actually looked better. He has [name] large area of irritation around the area which I think is contact dermatitis. I am doubtful that this is fungal His original wound on the left lateral calf continues to improve we have been using Hydrofera Blue There is no open area in the left first second toe web space although there is [name] lot of thick callus The DTI on the left heel required debridement today of necrotic surface eschar and subcutaneous tissue using silver alginate Finally the area on the right first second toe webspace continues to contract using silver alginate and ketoconazole 02/28/18 Left ischial tuberosity wounds look better using silver alginate. Original wound on the left calf only has one small open area left using Hydrofera Blue DTI on the left heel required debridement mostly removing skin from around this wound surface. Using  silver alginate The areas on the right first/second toe web space using silver alginate and ketoconazole 03/08/18 on evaluation today patient appears to be doing decently well as best I can tell in regard to his wounds. This is the first time that I have seen him as he generally is followed by Dr. Dellia Nims. With that being said none of his wounds appear to be infected he does have an area where there is some skin covering what appears to be [name] new wound on the left dorsal surface of his great toe. This is right at the nail bed. With that being said I do believe that debrided away some of the excess skin can be of benefit in this regard. Otherwise he has been tolerating the dressing changes without complication. 03/14/18; patient arrives today with the multiplicity of wounds that we are following. He has not been systemically unwell Original wound on the left lateral calf now only has 2 small open areas we've been using Hydrofera Blue which should continue The deep tissue injury on the left heel requires debridement today. We've been using silver alginate The left first second toe and the right first second toe are both are reminiscence what I think was tinea pedis. Apparently some of the callus Surface between the toes was removed last week when it started draining. Purulent drainage coming from the wound on the ischial tuberosity on the left. 03/21/18-He is here in follow-up evaluation for multiple wounds. 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 He tolerated debridement. The only change to last week's recommendations is to discontinue antifungal cream between toes. He will follow-up next week  03/28/18; following up for multiple wounds;Concern this week is streaking redness and swelling in the right foot. He is going to need antibiotics for this. 03/31/18; follow-up for right foot cellulitis. Streaking redness and swelling in the  right foot on 03/28/18. He has multiple antibiotic intolerances and [name] history of MRSA. I put him on clindamycin 300 mg every 6 and brought him in for [name] quick check. He has an open wound between his first and second toes on the right foot as [name] potential source. 04/04/18; Right foot cellulitis is resolving he is completing clindamycin. This is truly good news Left lateral calf wound which is initial wound only has one small open area inferiorly this is close to healing out. He has compression stockings. We will use Hydrofera Blue right down to the epithelialization of this Nonviable surface on the left heel which was initially pressure with [name] DTI. We've been using Hydrofera Blue. I'm going to switch this back to silver alginate Left first second toe/tinea pedis this looks better using silver alginate Right first second toe tinea pedis using silver alginate Large pressure ulcers on theLeft ischial tuberosity. Small wound here Looks better. I am uncertain about the surface over the large wound. Using silver alginate 04/11/18; Cellulitis in the right foot is resolved Left lateral calf wound which was his original wounds still has 2 tiny open areas remaining this is just about closed Nonviable surface on the left heel is better but still requires debridement Left first second toe/tinea pedis still open using silver alginate Right first second toe wound tinea pedis I asked him to go back to using ketoconazole and silver alginate Large pressure ulcers on the left ischial tuberosity this shear injury here is resolved. Wound is smaller. No evidence of infection using silver alginate 04/18/18; Patient arrives with an intense area of cellulitis in the right mid lower calf extending into the right heel area. Bright red and warm. Smaller area on the left anterior leg. He has [name] significant history of MRSA. He will definitely need antibioticsdoxycycline He now has 2 open areas on the left ischial tuberosity the  original large wound and now [name] satellite area which I think was above his initial satellite areas. Not [name] wonderful surface on this satellite area surrounding erythema which looks like pressure related. His left lateral calf wound again his original wound is just about closed Left heel pressure injury still requiring debridement Left first second toe looks [name] lot better using silver alginate Right first second toe also using silver alginate and ketoconazole cream also looks better 04/20/18; the patient was worked in early today out of concerns with his cellulitis on the right leg. I had started him on doxycycline. This was 2 days ago. His wife was concerned about the swelling in the area. Also concerned about the left buttock. He has not been systemically unwell no fever chills. No nausea vomiting or diarrhea 04/25/18; the patient's left buttock wound is continued to deteriorate he is using Hydrofera Blue. He is still completing clindamycin for the cellulitis on the right leg although all of this looks better. 05/02/18 Left buttock wound still with [name] lot of drainage and [name] very tightly adherent fibrinous necrotic surface. He has [name] deeper area superiorly The left lateral calf wound is still closed DTI wound on the left heel necrotic surface especially the circumference using Iodoflex Areas between his left first second toe and right first second toe both look better. Dorsally and the right first second toe he  had [name] necrotic surface although at smaller. In using silver alginate and ketoconazole. I did [name] culture last week which was [name] deep tissue culture of the reminiscence of the open wound on the right first second toe dorsally. This grew [name] few Acinetobacter and [name] few methicillin-resistant staph aureus. Nevertheless the area actually this week looked better. I didn't feel the need to specifically address this at least in terms of systemic antibiotics. 05/09/18; wounds are measuring larger more drainage per  our intake. We are using Santyl covered with alginate on the large superficial buttock wounds, Iodosorb on the left heel, ketoconazole and silver alginate to the dorsal first and second toes bilaterally. 05/16/18; The area on his left buttock better in some aspects although the area superiorly over the ischial tuberosity required an extensive debridement.using Santyl Left heel appears stable. Using Iodoflex The areas between his first and second toes are not bad however there is spreading erythema up the dorsal aspect of his left foot this looks like cellulitis again. He is insensate the erythema is really very brilliant.o Erysipelas He went to see an allergist days ago because he was itching part of this he had lab work done. This showed [name] white count of 15.1 with 70% neutrophils. Hemoglobin of 11.4 and [name] platelet count of 659,000. Last white count we had in Epic was [name] 2-1/2 years ago which was 25.9 but he was ill at the time. He was able to show me some lab work that was done by his primary physician the pattern is about the same. 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 But prompted me to go ahead and do x-rays of both feet and the pelvis rule out osteomyelitis. He also had [name] comprehensive metabolic panel this was reasonably normal his albumin was 3.7 liver function tests BUN/creatinine all normal 05/23/18; x-rays of both his feet from last week were negative for underlying pulmonary abnormality. The x-ray of his pelvis however showed mild irregularity in the left ischial which may represent some early osteomyelitis. The wound in the left ischial continues to get deeper clearly now exposed muscle. Each week necrotic surface material over this area. Whereas the rest of the wounds do not look so bad. The left ischial wound we have been using Santyl and calcium alginate T the left heel surface necrotic debris using Iodoflex o The left lateral leg is still  healed Areas on the left dorsal foot and the right dorsal foot are about the same. There is some inflammation on the left which might represent contact dermatitis, fungal dermatitis I am doubtful cellulitis although this looks better than last week 05/30/18; CT scan done at Hospital did not show any osteomyelitis or abscess. Suggested the possibility of underlying cellulitis although I don't see [name] lot of evidence of this at the bedside The wound itself on the left buttock/upper thigh actually looks somewhat better. No debridement Left heel also looks better no debridement continue Iodoflex Both dorsal first second toe spaces appear better using Lotrisone. Left still required debridement 06/06/18; Intake reported some purulent looking drainage from the left gluteal wound. Using Santyl and calcium alginate Left heel looks better although still [name] nonviable surface requiring debridement The left dorsal foot first/second webspace actually expanding and somewhat deeper. I may consider doing [name] shave biopsy of this area Right dorsal foot first/second webspace appears stable to improved. Using Lotrisone and silver alginate to both these areas 06/13/18 Left gluteal surface looks  better. Now separated in the 2 wounds. No debridement required. Still drainage. We'll continue silver alginate Left heel continues to look better with Iodoflex continue this for at least another week Of his dorsal foot wounds the area on the left still has some depth although it looks better than last week. We've been using Lotrisone and silver alginate 06/20/18 Left gluteal continues to look better healthy tissue Left heel continues to look better healthy granulation wound is smaller. He is using Iodoflex and his long as this continues continue the Iodoflex Dorsal right foot looks better unfortunately dorsal left foot does not. There is swelling and erythema of his forefoot. He had minor trauma to this several days ago but doesn't  think this was enough to have caused any tissue injury. Foot looks like cellulitis, we have had this problem before 06/27/18 on evaluation today patient appears to be doing [name] little worse in regard to his foot ulcer. 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 Both of which would really be his only options for treating this infection. In the past he has been given and effusion of Orbactiv. This is done very well for him in the past again it's one time dosing IV antibiotic therapy. Subsequently I do believe this is something we're gonna need to see about doing at this point in time. Currently his other wounds seem to be doing somewhat better in my pinion I'm pretty happy in that regard. 07/03/18 on evaluation today patient's wounds actually appear to be doing fairly well. He has been tolerating the dressing changes without complication. All in all he seems to be showing signs of improvement. In regard to the antibiotics he has been dealing with infectious disease since I saw him last week as far as getting this scheduled. In the end [facility]'s [facility] this coming Friday. In the meantime he has been continuing to perform the dressing changes in such as previous. There does not appear to be any evidence of infection worsengin at this time. 07/10/18; Since I last saw this man 2 [name] ago things have actually improved. IV antibiotics of resulted in less forefoot erythema although there is still some present. He is not systemically unwell Left buttock wounds 2 now have no depth there is increased epithelialization Using silver alginate Left heel still requires debridement using Iodoflex Left dorsal foot still with [name] sizable wound about the size of [name] border but healthy granulation Right dorsal foot still with [name]  slitlike area using silver alginate 07/18/18; the patient's cellulitis in the left foot is improved in fact I think it is on its way to resolving. Left buttock wounds 2 both look better although the larger one has hypertension granulation we've been using silver alginate Left heel has some thick circumferential redundant skin over the wound edge which will need to be removed today we've been using Iodoflex Left dorsal foot is still [name] sizable wound required debridement using silver alginate The right dorsal foot is just about closed only [name] small open area remains here 07/25/18; left foot cellulitis is resolved Left buttock wounds 2 both look better. Hyper-granulation on the major area Left heel as some debris over the surface but otherwise looks [name] healthier wound. Using silver collagen Right dorsal foot is just about closed 07/31/18; arrives with  our intake nurse worried about purulent drainage from the buttock. We had hyper-granulation here last week His buttock wounds 2 continue to look better Left heel some debris over the surface but measuring smaller. Right dorsal foot unfortunately has openings between the toes Left foot superficial wound looks less aggravated. 08/07/18 Buttock wounds continue to look better although some of her granulation and the larger medial wound. silver alginate Left heel continues to look [name] lot better.silver collagen Left foot superficial wound looks less stable. Requires debridement. He has [name] new wound superficial area on the foot on the lateral dorsal foot. Right foot looks better using silver alginate without Lotrisone 08/14/2018; patient was in the ER last week diagnosed with [name] UTI. He is now on Cefpodoxime and Macrodantin. Buttock wounds continued to be smaller. Using silver alginate Left heel continues to look better using silver collagen Left foot superficial wound looks as though it is improving Right dorsal foot area is just about healed. 08/21/2018; patient is  completed his antibiotics for his UTI. He has 2 open areas on the buttocks. There is still not closed although the surface looks satisfactory. Using silver alginate Left heel continues to improve using silver collagen The bilateral dorsal foot areas which are at the base of his first and second toes/possible tinea pedis are actually stable on the left but worse on the right. The area on the left required debridement of necrotic surface. After debridement I obtained [name] specimen for PCR culture. The right dorsal foot which is been just about healed last week is now reopened 08/28/2018; culture done on the left dorsal foot showed coag negative staph both staph epidermidis and Lugdunensis. I think this is worthwhile initiating systemic treatment. I will use doxycycline given his long list of allergies. The area on the left heel slightly improved but still requiring debridement. The large wound on the buttock is just about closed whereas the smaller one is larger. Using silver alginate in this area 09/04/2018; patient is completing his doxycycline for the left foot although this continues to be [name] very difficult wound area with very adherent necrotic debris. We are using silver alginate to all his wounds right foot left foot and the small wounds on his buttock, silver collagen on the left heel. 09/11/2018; once again this patient has intense erythema and swelling of the left forefoot. Lesser degrees of erythema in the right foot. He has [name] long list of allergies and intolerances. I will reinstitute doxycycline. 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 Using silver alginate Left heel also looks better using silver collagen Unfortunately both the areas on his feet look worse. The area on the left first second webspace is now gone through to the plantar part of his foot. The area on the left foot anteriorly is irritated with erythema and swelling in the  forefoot. 09/25/2018 His wound on the left plantar heel looks better. Using silver collagen The area on the left buttock 2 small remnant areas. One is closed one is still open. Using silver alginate The areas between both his first and second toes look worse. This in spite of long-standing antifungal therapy with ketoconazole and silver alginate which should have antifungal activity He has small areas around his original wound on the left calf one is on the bottom of the original scar tissue and one superiorly both of these are small and superficial but again given wound history in this site this is  worrisome 10/02/2018 Left plantar heel continues to gradually contract using silver collagen Left buttock wound is unchanged using silver alginate The areas on his dorsal feet between his first and second toes bilaterally look about the same. I prescribed clindamycin ointment to see if we can address chronic staph colonization and also the underlying possibility of erythrasma The left lateral lower extremity wound is actually on the lateral part of his ankle. Small open area here. We have been using silver alginate 10/09/2018; Left plantar heel continues to look healthy and contract. No debridement is required Left buttock slightly smaller with [name] tape injury wound just below which was new this week Dorsal feet somewhat improved I have been using clindamycin Left lateral looks lower extremity the actual open area looks worse although [name] lot of this is epithelialized. I am going to change to silver collagen today He has [name] lot more swelling in the right leg although this is not pitting not red and not particularly warm there is [name] lot of spasm in the right leg usually indicative of people with paralysis of some underlying discomfort. We have reviewed his vascular status from 2017 he had [name] left greater saphenous vein ablation. I wonder about referring him back to vascular surgery if the area on the left  leg continues to deteriorate. 10/16/2018 in today for follow-up and management of multiple lower extremity ulcers. His left Buttock wound is much lower smaller and almost closed completely. The wound to the left ankle has began to reopen with Epithelialization and some adherent slough. He has multiple new areas to the left foot and leg. The left dorsal foot without much improvement. Wound present between left great webspace and 2nd toe. Erythema and edema present right leg. Right LE ultrasound obtained on 10/10/18 was negative for DVT . 10/23/2018; Left buttock is closed over. Still dry macerated skin but there is no open wound. I suspect this is chronic pressure/moisture Left lateral calf is quite [name] bit worse than when I saw this last. There is clearly drainage here he has macerated skin into the left plantar heel. We will change the primary dressing to alginate Left dorsal foot has some improvement in overall wound area. Still using clindamycin and silver alginate Right dorsal foot about the same as the left using clindamycin and silver alginate The erythema in the right leg has resolved. He is DVT rule out was negative Left heel pressure area required debridement although the wound is smaller and the surface is health 10/26/2018 The patient came back in for his nurse check today predominantly because of the drainage coming out of the left lateral leg with [name] recent reopening of his original wound on the left lateral calf. He comes in today with [name] large amount of surrounding erythema around the wound extending from the calf into the ankle and even in the area on the dorsal foot. He is not systemically unwell. He is not febrile. Nevertheless this looks like cellulitis. We have been using silver alginate to the area. I changed him to [name] regular visit and I am going to prescribe him doxycycline. The rationale here is [name] long list of medication intolerances and [name] history of MRSA. I did not see anything  that I thought would provide [name] valuable culture 10/30/2018 Follow-up from his appointment 4 days ago with really an extensive area of cellulitis in the left calf left lateral ankle and left dorsal foot. I put him on doxycycline. He has [name] long list of medication allergies  which are true allergy reactions. 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 In any case he is [name] lot better today. The erythema especially in the anterior and lateral left calf is better. He still has left ankle erythema. He also is complaining about increasing edema in the right leg we have only been using Kerlix Coban and he has been doing the wraps at home. Finally he has [name] spotty rash on the medial part of his upper left calf which looks like folliculitis or perhaps wrap occlusion type injury. Small superficial macules not pustules 11/06/18 patient arrives today with again [name] considerable degree of erythema around the wound on the left lateral calf extending into the dorsal ankle and dorsal foot. This is [name] lot worse than when I saw this last week. He is on doxycycline really with not [name] lot of improvement. He has not been systemically unwell Wounds on the; left heel actually looks improved. Original area on the left foot and proximity to the first and second toes looks about the same. He has superficial areas on the dorsal foot, anterior calf and then the reopening of his original wound on the left lateral calf which looks about the same The only area he has on the right is the dorsal webspace first and second which is smaller. He has [name] large area of dry erythematous skin on the left buttock small open area here. 11/13/2018; the patient arrives in much better condition. The erythema around the wound on the left lateral calf is [name] lot better. 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 In any case this is [name] lot better. The area on the left heel is very small and just about resolved using silver collagen we have been using silver alginate to the areas on his dorsal feet 11/20/2018; his wounds include the left lateral calf, left heel, dorsal aspects of both feet just proximal to the first second webspace. He is stable to slightly improved. I did not think any changes to his dressings were going to be necessary 11/27/2018 he has [name] reopening on the left buttock which is surrounded by what looks like tinea or perhaps some other form of dermatitis. The area on the left dorsal foot has some erythema around it I have marked this area but I am not sure whether this is cellulitis or not. Left heel is not closed. Left calf the reopening is really slightly longer and probably worse 1/13; in general things look better and smaller except for the left dorsal foot. Area on the left heel is just about closed, left buttock looks better only [name] small wound remains in the skin looks better [using Lotrisone] 1/20; the area on the left heel only has [name] few remaining open areas here. Left lateral calf about the same in terms of size, left dorsal foot slightly larger right lateral foot still not closed. The area on the left buttock has no open wound and the surrounding skin looks [name] lot better 1/27; the area on the left heel is closed. Left lateral calf better but still requiring extensive debridements. The area on his left buttock is closed. He still has the open areas on the left dorsal foot which is slightly smaller in the right foot which is slightly expanded. We have been using Iodoflex on these areas as well 2/3; left heel is  closed. Left lateral calf still requiring debridement using Iodoflex there is no open area on his left buttock however he has dry scaly skin over [name] large area of this. Not really responding well to the Lotrisone. Finally the areas on his dorsal feet at the level of the  first second webspace are slightly smaller on the right and about the same on the left. Both of these vigorously debrided with Anasept and gauze 2/10; left heel remains closed he has dry erythematous skin over the left buttock but there is no open wound here. Left lateral leg has come in and with. Still requiring debridement we have been using Iodoflex here. Finally the area on the left dorsal foot and right dorsal foot are really about the same extremely dry callused fissured areas. He does not yet have [name] dermatology appointment 2/17; left heel remains closed. He has [name] new open area on the left buttock. The area on the left lateral calf is bigger longer and still covered in necrotic debris. No major change in his foot areas bilaterally. I am awaiting for [name] dermatologist to look on this. We have been using ketoconazole I do not know that this is been doing any good at all. 2/24; left heel remains closed. The left buttock wound that was new reopening last week looks better. The left lateral calf appears better also although still requires debridement. The major area on his foot is the left first second also requiring debridement. We have been putting Prisma on all wounds. I do not believe that the ketoconazole has done too much good for his feet. He will use Lotrisone I am going to give him [name] 2-week course of terbinafine. We still do not have [name] dermatology appointment 3/2 left heel remains closed however there is skin over bone in this area I pointed this out to him today. The left buttock wound is epithelialized but still does not look completely stable. The area on the left leg required debridement were using silver collagen here. With regards to his feet we changed to Lotrisone last week and silver alginate. 3/9; left heel remains closed. Left buttock remains closed. The area on the right foot is essentially closed. The left foot remains unchanged. Slightly smaller on the left lateral calf. Using  silver collagen to both of these areas 3/16-Left heel remains closed. Area on right foot is closed. Left lateral calf above the lateral malleolus open wound requiring debridement with easy bleeding. Left dorsal wound proximal to first toe also debrided. Left ischial area open new. Patient has been using Prisma with wrapping every 3 days. Dermatology appointment is apparently tomorrow.Patient has completed his terbinafine 2-week course with some apparent improvement according to him, there is still flaking and dry skin in his foot on the left 3/23; area on the right foot is reopened. The area on the left anterior foot is about the same still [name] very necrotic adherent surface. He still has the area on the left leg and reopening is on the left buttock. He apparently saw dermatology although I do not have [name] note. According to the patient who is usually fairly well informed they did not have any good ideas. Put him on oral terbinafine which she is been on before. 3/30; using silver collagen to all wounds. Apparently his dermatologist put him on doxycycline and rifampin presumably some culture grew staph. I do not have this result. He remains on terbinafine although I have used terbinafine on him before 4/6; patient has had  [name] fairly substantial reopening on the right foot between the first and second toes. He is finished his terbinafine and I believe is on doxycycline and rifampin still as prescribed by dermatology. We have been using silver collagen to all his wounds although the patient reports that he thinks silver alginate does better on the wounds on his buttock. 4/13; the area on his left lateral calf about the same size but it did not require debridement. Left dorsal foot just proximal to the webspace between the first and second toes is about the same. Still nonviable surface. I note some superficial bronze discoloration of the dorsal part of his foot Right dorsal foot just proximal to the first  and second toes also looks about the same. I still think there may be the same discoloration I noted above on the left Left buttock wound looks about the same 4/20; left lateral calf appears to be gradually contracting using silver collagen. 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 The left dorsal foot wound is debrided of tightly adherent necrotic debris and really cleans up quite nicely. The right area is worse with expansion. I did not debride this it is now over the base of the second toe The area on his left buttock is smaller no debridement is required using silver collagen 5/4; left calf continues to make good progress. He arrives with erythema around the wounds on his dorsal foot which even extends to the plantar aspect. Very concerning for coexistent infection. He is finished the erythromycin I gave him for possible erythrasma this does not seem to have helped. The area on the left foot is about the same base of the dorsal toes Is area on the buttock looks improved on the left 5/11; left calf and left buttock continued to make good progress. Left foot is about the same to slightly improved. Major problem is on the right foot. He has not had an x-ray. Deep tissue culture I did last week showed both Enterobacter and E. coli. I did not change the doxycycline I put him on empirically although neither 1 of these were plated to doxycycline. He arrives today with the erythema looking worse on both the dorsal and plantar foot. Macerated skin on the bottom of the foot. he has not been systemically unwell 5/18-Patient returns at 1 week, left calf wound appears to be making some progress, left buttock wound appears slightly worse than last time, left foot wound looks slightly better, right foot redness is marginally better. X-ray of both feet show no air or evidence of osteomyelitis. Patient is finished his Omnicef and terbinafine. He  continues to have macerated skin on the bottom of the left foot as well as right 5/26; left calf wound is better, left buttock wound appears to have multiple small superficial open areas with surrounding macerated skin. X-rays that I did last time showed no evidence of osteomyelitis in either foot. He is finished cefdinir and doxycycline. I do not think that he was on terbinafine. He continues to have [name] large superficial open area on the right foot anterior dorsal and slightly between the first and second toes. I did send him to dermatology 2 months ago or so wondering about whether they would do [name] fungal scraping. I do not believe they did but did do [name] culture. We have been using silver alginate to the toe areas, he has been using antifungals at home topically either ketoconazole or Lotrisone. We are using silver  collagen on the left foot, silver alginate on the right, silver collagen on the left lateral leg and silver alginate on the left buttock 6/1; left buttock area is healed. We have the left dorsal foot, left lateral leg and right dorsal foot. We are using silver alginate to the areas on both feet and silver collagen to the area on his left lateral calf 6/8; the left buttock apparently reopened late last week. He is not really sure how this happened. He is tolerating the terbinafine. Using silver alginate to all wounds 6/15; left buttock wound is larger than last week but still superficial. Came in the clinic today with [name] report of purulence from the left lateral leg I did not identify any infection Both areas on his dorsal feet appear to be better. He is tolerating the terbinafine. Using silver alginate to all wounds 6/22; left buttock is about the same this week, left calf quite [name] bit better. His left foot is about the same however he comes in with erythema and warmth in the right forefoot once again. Culture that I gave him in the beginning of May showed Enterobacter and E. coli. I gave him  doxycycline and things seem to improve although neither 1 of these organisms was specifically plated. 6/29; left buttock is larger and dry this week. Left lateral calf looks to me to be improved. Left dorsal foot also somewhat improved right foot completely unchanged. The erythema on the right foot is still present. He is completing the Ceftin dinner that I gave him empirically [see discussion above.) 7/6 - All wounds look to be stable and perhaps improved, the left buttock wound is slightly smaller, per patient bleeds easily, completed ceftin, the right foot redness is less, he is on terbinafine 7/13; left buttock wound about the same perhaps slightly narrower. Area on the left lateral leg continues to narrow. Left dorsal foot slightly smaller right foot about the same. We are using silver alginate on the right foot and Hydrofera Blue to the areas on the left. Unna boot on the left 2 layer compression on the right 7/20; left buttock wound absolutely the same. Area on lateral leg continues to get better. Left dorsal foot require debridement as did the right no major change in the 7/27; left buttock wound the same size necrotic debris over the surface. The area on the lateral leg is closed once again. His left foot looks better right foot about the same although there is some involvement now of the posterior first second toe area. He is still on terbinafine which I have given him for [name] month, not certain [name] centimeter major change 06/25/19-All wounds appear to be slightly improved according to report, left buttock wound looks clean, both foot wounds have minimal to no debris the right dorsal foot has minimal slough. We are using Hydrofera Blue to the left and silver alginate to the right foot and ischial wound. 8/10-Wounds all appear to be around the same, the right forefoot distal part has some redness which was not there before, however the wound looks clean and small. Ischial wound looks about the  same with no changes 8/17; his wound on the left lateral calf which was his original chronic venous insufficiency wound remains closed. Since I last saw him the areas on the left dorsal foot right dorsal foot generally appear better but require debridement. The area on his left initial tuberosity appears somewhat larger to me perhaps hyper granulated and bleeds very easily. We have been  using Hydrofera Blue to the left dorsal foot and silver alginate to everything else 8/24; left lateral calf remains closed. The areas on his dorsal feet on the webspace of the first and second toes bilaterally both look better. The area on the left buttock which is the pressure ulcer stage II slightly smaller. I change the dressing to Hydrofera Blue to all areas 8/31; left lateral calf remains closed. The area on his dorsal feet bilaterally look better. Using Hydrofera Blue. Still requiring debridement on the left foot. No change in the left buttock pressure ulcers however 9/14; left lateral calf remains closed. Dorsal feet look quite [name] bit better than 2 [name] ago. Flaking dry skin also [name] lot better with the ammonium lactate I gave him 2 [name] ago. The area on the left buttock is improved. He states that his Roho cushion developed [name] leak and he is getting [name] new one, in the interim he is offloading this vigorously 9/21; left calf remains closed. Left heel which was [name] possible DTI looks better this week. He had macerated tissue around the left dorsal foot right foot looks satisfactory and improved left buttock wound. I changed his dressings to his feet to silver alginate bilaterally. Continuing Hydrofera Blue on the left buttock. 9/28 left calf remains closed. Left heel did not develop anything [possible DTI] dry flaking skin on the left dorsal foot. Right foot looks satisfactory. Improved left buttock wound. We are using silver alginate on his feet Hydrofera Blue on the buttock. I have asked him to go back to the  Lotrisone on his feet including the wounds and surrounding areas 10/5; left calf remains closed. The areas on the left and right feet about the same. [name] lot of this is epithelialized however debris over the remaining open areas. He is using Lotrisone and silver alginate. The area on the left buttock using Hydrofera Blue 10/26. Patient has been out for 3 [name] secondary to Covid concerns. He tested negative but I think his wife tested positive. He comes in today with the left foot substantially worse, right foot about the same. Even more concerning he states that the area on his left buttock closed over but then reopened and is considerably deeper in one aspect than it was before [stage III wound] 11/2; left foot really about the same as last week. Quarter sized wound on the dorsal foot just proximal to the first second toes. Surrounding erythema with areas of denuded epithelium. This is not really much different looking. Did not look like cellulitis this time however. Right foot area about the same.. We have been using silver alginate alginate on his toes Left buttock still substantial irritated skin around the wound which I think looks somewhat better. We have been using Hydrofera Blue here. 11/9; left foot larger than last week and [name] very necrotic surface. Right foot I think is about the same perhaps slightly smaller. Debris around the circumference also addressed. Unfortunately on the left buttock there is been [name] decline. Satellite lesions below the major wound distally and now [name] an additional one posteriorly we have been using Hydrofera Blue but I think this is [name] pressure issue 11/16; left foot ulcer dorsally again [name] very adherent necrotic surface. Right foot is about the same. Not much change in the pressure ulcer on his left buttock. 11/30; left foot ulcer dorsally basically the same as when I saw him 2 [name] ago. Very adherent fibrinous debris on the wound surface. Patient reports [name] lot  of  drainage as well. 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 We have been using Iodoflex, patient changed back to alginate because of the drainage. Area on his right dorsal foot really looks benign with [name] healthier surface certainly [name] lot better than on the left. Left buttock wounds all improved using Hydrofera Blue 12/7; left dorsal foot again no improvement. Tightly adherent debris. PCR culture I did last week only showed likely skin contaminant. I have gone ahead and done [name] punch biopsy of this which is about the last thing in terms of investigations I can think to do. He has known venous insufficiency and venous hypertension and this could be the issue here. The area on the right foot is about the same left buttock slightly worse according to our intake nurse secondary to [facility] Blue sticking to the wound 12/14; biopsy of the left foot that I did last time showed changes that could be related to wound healing/chronic stasis dermatitis phenomenon no neoplasm. We have been using silver alginate to both feet. I change the one on the left today to Sorbact and silver alginate to his other 2 wounds 12/28; the patient arrives with the following problems; Major issue is the dorsal left foot which continues to be [name] larger deeper wound area. Still with [name] completely nonviable surface Paradoxically the area mirror image on the right on the right dorsal foot appears to be getting better. He had some loss of dry denuded skin from the lower part of his original wound on the left lateral calf. Some of this area looked [name] little vulnerable and for this reason we put him in wrap that on this side this week The area on his left buttock is larger. He still has the erythematous circular area which I think is [name] combination of pressure, sweat. This does not look like cellulitis or fungal dermatitis 11/26/2019; -Dorsal left foot large open wound with depth. Still  debris over the surface. Using Sorbact The area on the dorsal right foot paradoxically has closed over He has [name] reopening on the left ankle laterally at the base of his original wound that extended up into the calf. This appears clean. The left buttock wound is smaller but with very adherent necrotic debris over the surface. We have been using silver alginate here as well The patient had arterial studies done in 2017. He had biphasic waveforms at the dorsalis pedis and posterior tibial bilaterally. ABI in the left was 1.17. Digit waveforms were dampened. He has slight spasticity in the great toes I do not think [name] TBI would be possible 1/11; the patient comes in today with [name] sizable reopening between the first and second toes on the right. This is not exactly in the same location where we have been treating wounds previously. According to our intake nurse this was actually fairly deep but 0.6 cm. The area on the left dorsal foot looks about the same the surface is somewhat cleaner using Sorbact, his MRI is in 2 days. We have not managed yet to get arterial studies. The new reopening on the left lateral calf looks somewhat better using alginate. The left buttock wound is about the same using alginate 1/18; the patient had his ARTERIAL studies which were quite normal. ABI in the right at 1.13 with triphasic/biphasic waveforms on the left ABI 1.06 again with triphasic/biphasic waveforms. It would not have been possible to have done [name] toe brachial index because of spasticity. We have  been using Sorbac to the left foot alginate to the rest of his wounds on the right foot left lateral calf and left buttock 1/25; arrives in clinic with erythema and swelling of the left forefoot worse over the first MTP area. This extends laterally dorsally and but also posteriorly. Still has an area on the left lateral part of the lower part of his calf wound it is eschared and clearly not closed. Area on the left buttock  still with surrounding irritation and erythema. Right foot surface wound dorsally. The area between the right and first and second toes appears better. 2/1; The left foot wound is about the same. Erythema slightly better I gave him [name] week of doxycycline empirically Right foot wound is more extensive extending between the toes to the plantar surface Left lateral calf really no open surface on the inferior part of his original wound however the entire area still looks vulnerable Absolutely no improvement in the left buttock wound required debridement. 2/8; the left foot is about the same. Erythema is slightly improved I gave him clindamycin last week. Right foot looks better he is using Lotrimin and silver alginate He has [name] breakdown in the left lateral calf. Denuded epithelium which I have removed Left buttock about the same were using Hydrofera Blue 2/15; left foot is about the same there is less surrounding erythema. Surface still has tightly adherent debris which I have debriding however not making any progress Right foot has [name] substantial wound on the medial right second toe between the first and second webspace. Still an open area on the left lateral calf distal area. Buttock wound is about the same 2/22; left foot is about the same less surrounding erythema. Surface has adherent debris. Polymen Ag Right foot area significant wound between the first and second toes. We have been using silver alginate here Left lateral leg polymen Ag at the base of his original venous insufficiency wound Left buttock some improvement here 3/1; Right foot is deteriorating in the first second toe webspace. Larger and more substantial. We have been using silver alginate. Left dorsal foot about the same markedly adherent surface debris using PolyMem Ag Left lateral calf surface debris using PolyMem AG Left buttock is improved again using PolyMem Ag. He is completing his terbinafine. The erythema in the foot  seems better. He has been on this for 2 [name] 3/8; no improvement in any wound area in fact he has [name] small open area on the dorsal midfoot which is new this week. He has not gotten his foot x-rays yet 3/15; his x-rays were both negative for osteomyelitis of both feet. No major change in any of his wounds on the extremities however his buttock wounds are better. We have been using polymen on the buttocks, left lower leg. Iodoflex on the left foot and silver alginate on the right 3/22; arrives in clinic today with the 2 major issues are the improvement in the left dorsal foot wound which for once actually looks healthy with [name] nice healthy wound surface without debridement. Using Iodoflex here. Unfortunately on the left lateral calf which is in the distal part of his original wound he came to the clinic here for there was purulent drainage noted some increased breakdown scattered around the original area and [name] small area proximally. We we are using polymen here will change to silver alginate today. His buttock wound on the left is better and I think the area on the right first second toe webspace is  also improved 3/29; left dorsal foot looks better. Using Iodoflex. Left ankle culture from deterioration last time grew E. coli, Enterobacter and Enterococcus. I will give him [name] course of cefdinir although that will not cover Enterococcus. The area on the right foot in the webspace of the first and second toe lateral first toe looks better. The area on his buttock is about healed Vascular appointment is on April 21. This is to look at his venous system vis--vis continued breakdown of the wounds on the left including the left lateral leg and left dorsal foot he. He has had previous ablations on this side 4/5; the area between the right first and second toes lateral aspect of the first toe looks better. Dorsal aspect of the left first toe on the left foot also improved. Unfortunately the left lateral lower leg  is larger and there is [name] second satellite wound superiorly. The usual superficial abrasions on the left buttock overall better but certainly not closed 4/12; the area between the right first and second toes is improved. Dorsal aspect of the left foot also slightly smaller with [name] vibrant healthy looking surface. No real change in the left lateral leg and the left buttock wound is healed He has an unaffordable co-pay for Apligraf. Appointment with vein and vascular with regards to the left leg venous part of the circulation is on 4/21 4/19; we continue to see improvement in all wound areas. Although this is minor. He has his vascular appointment on 4/21. The area on the left buttock has not reopened although right in the center of this area the skin looks somewhat threatened 4/26; the left buttock is unfortunately reopened. In general his left dorsal foot has [name] healthy surface and looks somewhat smaller although it was not measured as such. The area between his first and second toe webspace on the right as [name] small wound against the first toe. The patient saw vascular surgery. The real question I was asking was about the small saphenous vein on the left. He has previously ablated left greater saphenous vein. Nothing further was commented on on the left. Right greater saphenous vein without reflux at the saphenofemoral junction or proximal thigh there was no indication for ablation of the right greater saphenous vein duplex was negative for DVT bilaterally. They did not think there was anything from [name] point of view that could be offered. They ABIs within normal limits 5/3; only small open area on the left buttock. The area on the left lateral leg which was his original venous reflux is now 2 wounds both which look clean. We are using Iodoflex on the left dorsal foot which looks healthy and smaller. He is down to [name] very tiny area between the right first and second toes, using  silver alginate 5/10; all of his wounds appear better. We have much better edema control in 4 layer compression on the left. This may be the factor that is allowing the left foot and left lateral calf to heal. He has external compression garments at home 04/14/20-All of his wounds are progressing well, the left forefoot is practically closed, left ischium appears to be about the same, right toe webspace is also smaller. The left lateral leg is about the same, continue using Hydrofera Blue to this, silver alginate to the ischium, Iodoflex to the toe space on the right 6/7; most of his wounds outside of the left buttock are doing well. The area on the left lateral calf and left  dorsal foot are smaller. The area on the right foot in between the first and second toe webspace is barely visible although he still says there is some drainage here is the only reason I did not heal this out. Unfortunately the area on the left buttock almost looks like he has [name] skin tear from tape. He has open wound and then [name] large flap of skin that we are trying to get adherence over an area just next to the remaining wound 6/21; 2 week follow-up. I believe is been here for nurse visits. Miraculously the area between his first and second toes on the left dorsal foot is closed over. Still open on the right first second web space. The left lateral calf has 2 open areas. Distally this is more superficial. The proximal area had [name] little more depth and required debridement of adherent necrotic material. His buttock wound is actually larger we have been using silver alginate here 6/28; the patient's area on the left foot remains closed. Still open wet area between the first and second toes on the right and also extending into the plantar aspect. We have been using silver alginate in this location. He has 2 areas on the left lower leg part of his original long wounds which I think are better. We have been using Hydrofera Blue here.  Hydrofera Blue to the left buttock which is stable 7/12; left foot remains closed. Left ankle is closed. May be [name] small area between his right first and second toes the only truly open area is on the left buttock. We have been using Hydrofera Blue here 7/19; patient arrives with marked deterioration especially in the left foot and ankle. We did not put him in [name] compression wrap on the left last week in fact he wore his juxta lite stockings on either side although he does not have an underlying stocking. He has [name] reopening on the left dorsal foot, left lateral ankle and [name] new area on the right dorsal ankle. More worrisome is the degree of erythema on the left foot extending on the lateral foot into the lateral lower leg on the left 7/26; the patient had erythema and drainage from the lateral left ankle last week. Culture of this grew MRSA resistant to doxycycline and clindamycin which are the 2 antibiotics we usually use with this patient who has multiple antibiotic allergies including linezolid, trimethoprim sulfamethoxazole. I had give him an empiric doxycycline and he comes in the area certainly looks somewhat better although it is blotchy in his lower leg. He has not been systemically unwell. He has had areas on the left dorsal foot which is [name] reopening, chronic wounds on the left lateral ankle. Both of these I think are secondary to chronic venous insufficiency. The area between his first and second toes is closed as far as I can tell. He had [name] new wrap injury on the right dorsal ankle last week. Finally he has an area on the left buttock. We have been using silver alginate to everything except the left buttock we are using Hydrofera Blue 06/30/20-Patient returns at 1 week, has been given [name] sample dose pack of NUZYRA which is [name] tetracycline derivative [omadacycline], patient has completed those, we have been using silver alginate to almost all the wounds except the left ischium where we are using  Hydrofera Blue all of them look better 8/16; since I last saw the patient he has been doing well. The area on the left buttock, left lateral  ankle and left foot are all closed today. He has completed the [place] I gave him last time and tolerated this well. He still has open areas on the right dorsal ankle and in the right first second toe area which we are using silver alginate. 8/23; we put him in his bilateral external compression stockings last week as he did not have anything open on either leg except for concerning area between the right first and second toe. He comes in today with an area on the left dorsal foot slightly more proximal than the original wound, the left lateral foot but this is actually [name] continuation of the area he had on the left lateral ankle from last time. As well he is opened up on the left buttock again. 8/30; comes in today with things looking [name] lot better. The area on the left lower ankle has closed down as has the left foot but with eschar in both areas. The area on the dorsal right ankle is also epithelialized. Very little remaining of the left buttock wound. We have been using silver alginate on all wound areas 9/13; the area in the first second toe webspace on the right has fully epithelialized. He still has some vulnerable epithelium on the right and the ankle and the dorsal foot. He notes weeping. He is using his juxta lite stocking. On the left again the left dorsal foot is closed left lateral ankle is closed. We went to the juxta lite stocking here as well. Still vulnerable in the left buttock although only 2 small open areas remain here 9/27; 2-week follow-up. We did not look at his left leg but the patient says everything is closed. He is [name] bit disturbed by the amount of edema in his left foot he is using juxta lite stockings but asking about over the toes stockings which would be 30/40, will talk to him next time. According to him there is no open wound on  either the left foot or the left ankle/calf He has an open area on the dorsal right calf which I initially point [name] wrap injury. He has superficial remaining wound on the left ischial tuberosity been using silver alginate although he says this sticks to the wound 10/5; we gave him 2-week follow-up but he called yesterday expressing some concerns about his right foot right ankle and the left buttock. He came in early. There is still no open areas on the left leg and that still in his juxta lite stocking 10/11; he only has 1 small area on the left buttock that remains measuring millimeters 1 mm. Still has the same irritated skin in this area. We recommended zinc oxide when this eventually closes and pressure relief is meticulously is he can do this. He still has an area on the dorsal part of his right first through third toes which is [name] bit irritated and still open and on the dorsal ankle near the crease of the ankle. We have been using silver alginate and using his own stocking. He has nothing open on the left leg or foot 10/25; 2-week follow-up. Not nearly as good on the left buttock as I was hoping. For open areas with 5 looking threatened small. He has the erythematous irritated chronic skin in this area. 1 area on the right dorsal ankle. He reports this area bleeds easily Right dorsal foot just proximal to the base of his toes We have been using silver alginate. Electronic Signature(s) Signed: 09/15/2020 5:10:17 PM By: Linton Ham  MD Entered By: Linton Ham on 09/15/2020 08:26:53 -------------------------------------------------------------------------------- Physical Exam Details Patient Name: Date of Service: [name] [name], [name] [name] 09/15/2020 7:30 [name] M Medical Record Number: [id number] Patient Account Number: [id number] Date of Birth/Sex: Treating RN: 1986/02/14 (34 y.o. [name] [name] [name] Primary Care Provider: [place], [place] Other Clinician: Referring Provider: Treating Provider/Extender:  [name] [name] [name] in Treatment: 245 Constitutional Sitting or standing Blood Pressure is within target range for patient.. Pulse regular and within target range for patient.Marland Kitchen Respirations regular, non-labored and within target range.. Temperature is normal and within the target range for the patient.Marland Kitchen Appears in no distress. Notes Wound exam; left buttock now for on its way to 5 open areas. Same irritated skin from pressure, friction and wetness. Area on the right ankle apparently bleeds easily Dorsal right foot I think is edematous I think the patient has chronic venous insufficiency. I have talked to him about this in some detail. I did not specifically see anything between his toes Electronic Signature(s) Signed: 09/15/2020 5:10:17 PM By: Linton Ham MD Entered By: Linton Ham on 09/15/2020 08:28:31 -------------------------------------------------------------------------------- Physician Orders Details Patient Name: Date of Service: [name] [name], [name] [name] 09/15/2020 7:30 [name] M Medical Record Number: [id number] Patient Account Number: [id number] Date of Birth/Sex: Treating RN: 04-08-1986 (34 y.o. [name] [name] [name] Primary Care Provider: [name], [name] Other Clinician: Referring Provider: Treating Provider/Extender: [name] [name] [name] in Treatment: [phone] Verbal / Phone Orders: No Diagnosis Coding ICD-10 Coding Code Description I87.332 Chronic venous hypertension (idiopathic) with ulcer and inflammation of left lower extremity L97.321 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 to breakdown of skin L97.311 Non-pressure chronic ulcer of right ankle limited to breakdown of skin L97.511 Non-pressure chronic ulcer of other part of right foot limited to breakdown of skin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 healthier looking wound bed contractions and the top 50% of this wound  and some on the lesser 50%. Wound bed appears healthy. The area between the first and second toe on the right still remains problematic 04/21/17; continued very gradual improvement. Using [facility] 04/28/17; continued very gradual improvement in the left lateral leg venous insufficiency wound. His periwound erythema is very mild. We have been using Hydrofera Blue. Wound is making progress especially in the superior 50% 05/05/17; he continues to have very gradual improvement in the left lateral venous insufficiency wound. Both in terms with an length rings are improving. I debrided this every 2 [name] with #5 curet and we have been using Hydrofera Blue and again making good progress With regards to the wounds between his right first and second toe which I thought might of been tinea pedis he is not making as much progress very dry scaly skin over the area. Also the area at the base of the left first and second toe in [name] similar condition 05/12/17; continued gradual improvement in the refractory left lateral venous insufficiency wound on the left. Dimension smaller. Surface still requiring debridement using Hydrofera Blue 05/19/17; continued gradual improvement in the refractory left lateral venous ulceration. Careful inspection of the wound bed underlying rumination suggested some degree of epithelialization over the surface no debridement indicated. Continue Hydrofera Blue difficult areas between his toes first and third on the left than first and second on the right. I'm going to change to silver alginate from silver collagen. Continue ketoconazole as I suspect underlying tinea pedis 05/26/17; left lateral leg venous insufficiency wound. We've been using Hydrofera Blue. I believe that there is expanding epithelialization over the surface of the wound albeit not coming from the wound circumference. This is [name] bit of an odd situation in which the epithelialization seems to be coming from the surface of the wound rather than in the exact circumference. There is still small open areas mostly along the lateral margin of the wound. ooHe has unchanged areas between the left first and second and the right  first second toes which I been treating for tenia pedis 06/02/17; left lateral leg venous insufficiency wound. We have been using Hydrofera Blue. Somewhat smaller from the wound circumference. The surface of the wound remains [name] bit on it almost epithelialized sedation in appearance. I use an open curette today debridement in the surface of all of this especially the edges ooSmall open wounds remaining on the dorsal right first and second toe interspace and the plantar left first second toe and her face on the left 06/09/17; wound on the left lateral leg continues to be smaller but very gradual and very dry surface using Hydrofera Blue 06/16/17 requires weekly debridements now on the left lateral leg although this continues to contract. I changed to silver collagen last week because of dryness of the wound bed. Using Iodoflex to the areas on his first and second toes/web space bilaterally 06/24/17; patient with history of paraplegia also chronic venous insufficiency with lymphedema. Has [name] very difficult wound on the left lateral leg. This has been gradually reducing in terms of with but comes in with [name] very dry adherent surface. High switch to silver collagen [name] week or so ago with hydrogel to keep the area moist. This is been refractory to multiple dressing attempts. He also has areas in his first and second toes bilaterally in the anterior and posterior web space. I had been using Iodoflex here after [name] prolonged course of silver alginate with ketoconazole was ineffective [question tinea pedis] 07/14/17; patient arrives  today with [name] very difficult adherent material over his left lateral lower leg wound. He also has surrounding erythema and poorly controlled edema. He was switched his Santyl last visit which the nurses are applying once during his doctor visit and once on [name] nurse visit. He was also reduced to 2 layer compression I'm not exactly sure of the issue here. 07/21/17; better surface today after 1  week of Iodoflex. Significant cellulitis that we treated last week also better. [Doxycycline] 07/28/17 better surface today with now 2 [name] of Iodoflex. Significant cellulitis treated with doxycycline. He has now completed the doxycycline and he is back to his usual degree of chronic venous inflammation/stasis dermatitis. He reminds me he has had ablations surgery here 08/04/17; continued improvement with Iodoflex to the left lateral leg wound in terms of the surface of the wound although the dimensions are better. 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 Problematic areas on the plantar aspect of the first second toe web space on the left and the dorsal aspect of the first second toe web space on the right. At one point I felt these were probably related to chronic fungal infections in treated him aggressively for this although we have not made any improvement here. 08/11/17; left lateral leg. Surface continues to improve with the Iodoflex although we are not seeing much improvement in overall wound dimensions. Areas on his plantar left foot and right foot show no improvement. In fact the right foot looks somewhat worse 08/18/17; left lateral leg. We changed to [facility] Blue last week after [name] prolonged course of Iodoflex which helps get the surface better. It appears that the wound with is improved. Continue with difficult areas on the left dorsal first second and plantar first second on the right 09/01/17; patient arrives in clinic today having had [name] temperature of 103 yesterday. He was seen in the ER and [facility]. The patient was concerned he could have cellulitis again in the right leg however they diagnosed him with [name] UTI and he is now on Keflex. He has [name] history of cellulitis which is been recurrent and difficult but this is been in the left leg, in the past 5 use doxycycline. He does in and out catheterizations at home which  are risk factors for UTI 09/08/17; patient will be completing his Keflex this weekend. The erythema on the left leg is considerably better. He has [name] new wound today on the medial part of the right leg small superficial almost looks like [name] skin tear. He has worsening of the area on the right dorsal first and second toe. His major area on the left lateral leg is better. Using Hydrofera Blue on all areas 09/15/17; gradual reduction in width on the long wound in the left lateral leg. No debridement required. He also has wounds on the plantar aspect of his left first second toe web space and on the dorsal aspect of the right first second toe web space. 09/22/17; there continues to be very gradual improvements in the dimensions of the left lateral leg wound. He hasn't round erythematous spot with might be pressure on his wheelchair. There is no evidence obviously of infection no purulence no warmth ooHe has [name] dry scaled area on the plantar aspect of the left first second toe ooImproved area on the dorsal right first second toe. 09/29/17; left lateral leg wound continues to improve in dimensions mostly with an is still [name] fairly long  but increasingly narrow wound. ooHe has [name] dry scaled area on the plantar aspect of his left first second toe web space ooIncreasingly concerning area on the dorsal right first second toe. In fact I am concerned today about possible cellulitis around this wound. The areas extending up his second toe and although there is deformities here almost appears to abut on the nailbed. 10/06/17; left lateral leg wound continues to make very gradual progress. Tissue culture I did from the right first second toe dorsal foot last time grew MRSA and enterococcus which was vancomycin sensitive. This was not sensitive to clindamycin or doxycycline. He is allergic to Zyvox and sulfa we have therefore arrange for him to have dalvance infusion tomorrow. He is had this in the past and tolerated it  well 10/20/17; left lateral leg wound continues to make decent progress. This is certainly reduced in terms of with there is advancing epithelialization.ooThe cellulitis in the right foot looks better although he still has [name] deep wound in the dorsal aspect of the first second toe web space. Plantar left first toe web space on the left I think is making some progress 10/27/17; left lateral leg wound continues to make decent progress. Advancing epithelialization.using Hydrofera Blue ooThe right first second toe web space wound is better-looking using silver alginate ooImprovement in the left plantar first second toe web space. Again using silver alginate 11/03/17 left lateral leg wound continues to make decent progress albeit slowly. Using Hydrofera Blue ooThe right per second toe web space continues to be [name] very problematic looking punched out wound. I obtained [name] piece of tissue for deep culture I did extensively treated this for fungus. It is difficult to imagine that this is [name] pressure area as the patient states other than going outside he doesn't really wear shoes at home ooThe left plantar first second toe web space looked fairly senescent. Necrotic edges. This required debridement oochange to Hydrofera Blue to all wound areas 11/10/17; left lateral leg wound continues to contract. Using Hydrofera Blue ooOn the right dorsal first second toe web space dorsally. Culture I did of this area last week grew MRSA there is not an easy oral option in this patient was multiple antibiotic allergies or intolerances. This was only [name] rare culture isolate I'm therefore going to use Bactroban under silver alginate ooOn the left plantar first second toe web space. Debridement is required here. This is also unchanged 11/17/17; left lateral leg wound continues to contract using Hydrofera Blue this is no longer the major issue. ooThe major concern here is the right first second toe web space. He now has an  open area going from dorsally to the plantar aspect. There is now wound on the inner lateral part of the first toe. Not [name] very viable surface on this. There is erythema spreading medially into the forefoot. ooNo major change in the left first second toe plantar wound 11/24/17; left lateral leg wound continues to contract using Hydrofera Blue. Nice improvement today ooThe right first second toe web space all of this looks [name] lot less angry than last week. I have given him clindamycin and topical Bactroban for MRSA and terbinafine for the possibility of underlining tinea pedis that I could not control with ketoconazole. Looks somewhat better ooThe area on the plantar left first second toe web space is weeping with dried debris around the wound 12/01/17; left lateral leg wound continues to contract he Hydrofera Blue. It is becoming thinner in terms of with nevertheless it  is making good improvement. ooThe right first second toe web space looks less angry but still [name] large necrotic-looking wounds starting on the plantar aspect of the right foot extending between the toes and now extensively on the base of the right second toe. I gave him clindamycin and topical Bactroban for MRSA anterior benefiting for the possibility of underlying tinea pedis. Not looking better today ooThe area on the left first/second toe looks better. Debrided of necrotic debris 12/05/17* the patient was worked in urgently today because over the weekend he found blood on his incontinence bad when he woke up. He was found to have an ulcer by his wife who does most of his wound care. He came in today for [place] to look at this. He has not had [name] history of wounds in his buttocks in spite of his paraplegia. 12/08/17; seen in follow-up today at his usual appointment. He was seen earlier this week and found to have [name] new wound on his buttock. We also follow him for wounds on the left lateral leg, left first second toe web space and right first  second toe web space 12/15/17; we have been using Hydrofera Blue to the left lateral leg which has improved. The right first second toe web space has also improved. Left first second toe web space plantar aspect looks stable. The left buttock has worsened using Santyl. Apparently the buttock has drainage 12/22/17; we have been using Hydrofera Blue to the left lateral leg which continues to improve now 2 small wounds separated by normal skin. He tells [place] he had [name] fever up to 100 yesterday he is prone to UTIs but has not noted anything different. He does in and out catheterizations. The area between the first and second toes today does not look good necrotic surface covered with what looks to be purulent drainage and erythema extending into the third toe. I had gotten this to something that I thought look better last time however it is not look good today. He also has [name] necrotic surface over the buttock wound which is expanded. I thought there might be infection under here so I removed [name] lot of the surface with [name] #5 curet though nothing look like it really needed culturing. He is been using Santyl to this area 12/27/17; his original wound on the left lateral leg continues to improve using Hydrofera Blue. I gave him samples of Baxdella although he was unable to take them out of fear for an allergic reaction ["lump in his throat"].the culture I did of the purulent drainage from his second toe last week showed both enterococcus and [name] set Enterobacter I was also concerned about the erythema on the bottom of his foot although paradoxically although this looks somewhat better today. 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 We've been using silver alginate here. They came up today that he sleeps in [name] recliner, I'm not sure why but I asked him to stop this 01/03/18; his original wound we've been using Hydrofera Blue is now  separated into 2 areas. ooUlcer on his left buttock is better he is off the recliner and sleeping in bed ooFinally both wound areas between his first and second toes also looks some better 01/10/18; his original wound on the left lateral leg is now separated into 2 wounds we've been using Hydrofera Blue ooUlcer on his left buttock has some drainage. There is [name] small  probing site going into muscle layer superiorly.using silver alginate -He arrives today with [name] deep tissue injury on the left heel ooThe wound on the dorsal aspect of his first second toe on the left looks [name] lot betterusing silver alginate ketoconazole ooThe area on the first second toe web space on the right also looks [name] lot bette 01/17/18; his original wound on the left lateral leg continues to progress using Hydrofera Blue ooUlcer on his left buttock also is smaller surface healthier except for [name] small probing site going into the muscle layer superiorly. 2.4 cm of tunneling in this area ooDTI on his left heel we have only been offloading. Looks better than last week no threatened open no evidence of infection oothe wound on the dorsal aspect of the first second toe on the left continues to look like it's regressing we have only been using silver alginate and terbinafine orally ooThe area in the first second toe web space on the right also looks to be [name] lot better using silver alginate and terbinafine I think this was prompted by tinea pedis 01/31/18; the patient was hospitalized in [place] last week apparently for [name] complicated UTI. He was discharged on cefepime he does in and out catheterizations. In the hospital he was discovered M I don't mild elevation of AST and ALT and the terbinafine was stopped.predictably the pressure ulcer on s his buttock looks betterusing silver alginate. The area on the left lateral leg also is better using Hydrofera Blue. The area between the first and second toes on the left better. First and  second toes on the right still substantial but better. Finally the DTI on the left heel has held together and looks like it's resolving 02/07/18-he is here in follow-up evaluation for multiple ulcerations. He has new injury to the lateral aspect of the last issue [name] pressure ulcer, he states this is from adhesive removal trauma. He states he has tried multiple adhesive products with no success. All other ulcers appear stable. The left heel DTI is resolving. We will continue with same treatment plan and follow-up next week. 02/14/18; follow-up for multiple areas. ooHe has [name] new area last week on the lateral aspect of his pressure ulcer more over the posterior trochanter. The original pressure ulcer looks quite stable has healthy granulation. 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 Almost fully epithelialized on the original superior area using Hydrofera Blue ooDTI on the left heel has peeled off this week to reveal [name] small superficial wound under denuded skin and subcutaneous tissue ooBoth areas between the first and second toes look better including nothing open on the left 02/21/18; ooThe patient's wounds on his left ischial tuberosity and posterior left greater trochanter actually looked better. He has [name] large area of irritation around the area which I think is contact dermatitis. I am doubtful that this is fungal ooHis original wound on the left lateral calf continues to improve we have been using Hydrofera Blue ooThere is no open area in the left first second toe web space although there is [name] lot of thick callus ooThe DTI on the left heel required debridement today of necrotic surface eschar and subcutaneous tissue using silver alginate ooFinally the area on the right first second toe webspace continues to contract using silver alginate and ketoconazole 02/28/18 ooLeft ischial  tuberosity wounds look better using silver alginate. ooOriginal wound on the left calf only  has one small open area left using Hydrofera Blue ooDTI on the left heel required debridement mostly removing skin from around this wound surface. Using silver alginate ooThe areas on the right first/second toe web space using silver alginate and ketoconazole 03/08/18 on evaluation today patient appears to be doing decently well as best I can tell in regard to his wounds. This is the first time that I have seen him as he generally is followed by Dr. Dellia Nims. With that being said none of his wounds appear to be infected he does have an area where there is some skin covering what appears to be [name] new wound on the left dorsal surface of his great toe. This is right at the nail bed. With that being said I do believe that debrided away some of the excess skin can be of benefit in this regard. Otherwise he has been tolerating the dressing changes without complication. 03/14/18; patient arrives today with the multiplicity of wounds that we are following. He has not been systemically unwell ooOriginal wound on the left lateral calf now only has 2 small open areas we've been using Hydrofera Blue which should continue ooThe deep tissue injury on the left heel requires debridement today. We've been using silver alginate ooThe left first second toe and the right first second toe are both are reminiscence what I think was tinea pedis. Apparently some of the callus Surface between the toes was removed last week when it started draining. ooPurulent drainage coming from the wound on the ischial tuberosity on the left. 03/21/18-He is here in follow-up evaluation for multiple wounds. 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 He tolerated debridement. The only change to last week's recommendations is to discontinue antifungal cream between toes.  He will follow-up next week 03/28/18; following up for multiple wounds;Concern this week is streaking redness and swelling in the right foot. He is going to need antibiotics for this. 03/31/18; follow-up for right foot cellulitis. Streaking redness and swelling in the right foot on 03/28/18. He has multiple antibiotic intolerances and [name] history of MRSA. I put him on clindamycin 300 mg every 6 and brought him in for [name] quick check. He has an open wound between his first and second toes on the right foot as [name] potential source. 04/04/18; ooRight foot cellulitis is resolving he is completing clindamycin. This is truly good news ooLeft lateral calf wound which is initial wound only has one small open area inferiorly this is close to healing out. He has compression stockings. We will use Hydrofera Blue right down to the epithelialization of this ooNonviable surface on the left heel which was initially pressure with [name] DTI. We've been using Hydrofera Blue. I'm going to switch this back to silver alginate ooLeft first second toe/tinea pedis this looks better using silver alginate ooRight first second toe tinea pedis using silver alginate ooLarge pressure ulcers on theLeft ischial tuberosity. Small wound here Looks better. I am uncertain about the surface over the large wound. Using silver alginate 04/11/18; ooCellulitis in the right foot is resolved ooLeft lateral calf wound which was his original wounds still has 2 tiny open areas remaining this is just about closed ooNonviable surface on the left heel is better but still requires debridement ooLeft first second toe/tinea pedis still open using silver alginate ooRight first second toe wound tinea pedis I asked him to go back to using ketoconazole and silver alginate ooLarge pressure ulcers on the  left ischial tuberosity this shear injury here is resolved. Wound is smaller. No evidence of infection using silver alginate 04/18/18; ooPatient arrives  with an intense area of cellulitis in the right mid lower calf extending into the right heel area. Bright red and warm. Smaller area on the left anterior leg. He has [name] significant history of MRSA. He will definitely need antibioticsoodoxycycline ooHe now has 2 open areas on the left ischial tuberosity the original large wound and now [name] satellite area which I think was above his initial satellite areas. Not [name] wonderful surface on this satellite area surrounding erythema which looks like pressure related. ooHis left lateral calf wound again his original wound is just about closed ooLeft heel pressure injury still requiring debridement ooLeft first second toe looks [name] lot better using silver alginate ooRight first second toe also using silver alginate and ketoconazole cream also looks better 04/20/18; the patient was worked in early today out of concerns with his cellulitis on the right leg. I had started him on doxycycline. This was 2 days ago. His wife was concerned about the swelling in the area. Also concerned about the left buttock. He has not been systemically unwell no fever chills. No nausea vomiting or diarrhea 04/25/18; the patient's left buttock wound is continued to deteriorate he is using Hydrofera Blue. He is still completing clindamycin for the cellulitis on the right leg although all of this looks better. 05/02/18 ooLeft buttock wound still with [name] lot of drainage and [name] very tightly adherent fibrinous necrotic surface. He has [name] deeper area superiorly ooThe left lateral calf wound is still closed ooDTI wound on the left heel necrotic surface especially the circumference using Iodoflex ooAreas between his left first second toe and right first second toe both look better. Dorsally and the right first second toe he had [name] necrotic surface although at smaller. In using silver alginate and ketoconazole. I did [name] culture last week which was [name] deep tissue culture of the reminiscence of the  open wound on the right first second toe dorsally. This grew [name] few Acinetobacter and [name] few methicillin-resistant staph aureus. Nevertheless the area actually this week looked better. I didn't feel the need to specifically address this at least in terms of systemic antibiotics. 05/09/18; wounds are measuring larger more drainage per our intake. We are using Santyl covered with alginate on the large superficial buttock wounds, Iodosorb on the left heel, ketoconazole and silver alginate to the dorsal first and second toes bilaterally. 05/16/18; ooThe area on his left buttock better in some aspects although the area superiorly over the ischial tuberosity required an extensive debridement.using Santyl ooLeft heel appears stable. Using Iodoflex ooThe areas between his first and second toes are not bad however there is spreading erythema up the dorsal aspect of his left foot this looks like cellulitis again. He is insensate the erythema is really very brilliant.o Erysipelas He went to see an allergist days ago because he was itching part of this he had lab work done. This showed [name] white count of 15.1 with 70% neutrophils. Hemoglobin of 11.4 and [name] platelet count of 659,000. Last white count we had in [facility] was [name] 2-1/2 years ago which was 25.9 but he was ill at the time. He was able to show me some lab work that was done by his primary physician the pattern is about the same. 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 But prompted me to  go ahead and do x-rays of both feet and the pelvis rule out osteomyelitis. He also had [name] comprehensive metabolic panel this was reasonably normal his albumin was 3.7 liver function tests BUN/creatinine all normal 05/23/18; x-rays of both his feet from last week were negative for underlying pulmonary abnormality. The x-ray of his pelvis however showed mild irregularity in the left ischial which may represent some early osteomyelitis.  The wound in the left ischial continues to get deeper clearly now exposed muscle. Each week necrotic surface material over this area. Whereas the rest of the wounds do not look so bad. ooThe left ischial wound we have been using Santyl and calcium alginate ooT the left heel surface necrotic debris using Iodoflex o ooThe left lateral leg is still healed ooAreas on the left dorsal foot and the right dorsal foot are about the same. There is some inflammation on the left which might represent contact dermatitis, fungal dermatitis I am doubtful cellulitis although this looks better than last week 05/30/18; CT scan done at Hospital did not show any osteomyelitis or abscess. Suggested the possibility of underlying cellulitis although I don't see [name] lot of evidence of this at the bedside ooThe wound itself on the left buttock/upper thigh actually looks somewhat better. No debridement ooLeft heel also looks better no debridement continue Iodoflex ooBoth dorsal first second toe spaces appear better using Lotrisone. Left still required debridement 06/06/18; ooIntake reported some purulent looking drainage from the left gluteal wound. Using Santyl and calcium alginate ooLeft heel looks better although still [name] nonviable surface requiring debridement ooThe left dorsal foot first/second webspace actually expanding and somewhat deeper. I may consider doing [name] shave biopsy of this area ooRight dorsal foot first/second webspace appears stable to improved. Using Lotrisone and silver alginate to both these areas 06/13/18 ooLeft gluteal surface looks better. Now separated in the 2 wounds. No debridement required. Still drainage. We'll continue silver alginate ooLeft heel continues to look better with Iodoflex continue this for at least another week ooOf his dorsal foot wounds the area on the left still has some depth although it looks better than last week. We've been using Lotrisone and silver  alginate 06/20/18 ooLeft gluteal continues to look better healthy tissue ooLeft heel continues to look better healthy granulation wound is smaller. He is using Iodoflex and his long as this continues continue the Iodoflex ooDorsal right foot looks better unfortunately dorsal left foot does not. There is swelling and erythema of his forefoot. He had minor trauma to this several days ago but doesn't think this was enough to have caused any tissue injury. Foot looks like cellulitis, we have had this problem before 06/27/18 on evaluation today patient appears to be doing [name] little worse in regard to his foot ulcer. 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 Both of which would really be his only options for treating this infection. In the past he has been given and effusion of Orbactiv. This is done very well for him in the past again it's one time dosing IV antibiotic therapy. Subsequently I do believe this is something we're gonna need to see about doing at this point in time. Currently his other wounds seem to be doing somewhat better in my pinion I'm pretty happy in that regard. 07/03/18 on evaluation today patient's wounds actually appear to be doing fairly well. He has  been tolerating the dressing changes without complication. All in all he seems to be showing signs of improvement. In regard to the antibiotics he has been dealing with infectious disease since I saw him last week as far as getting this scheduled. In the end he's going [facility] this coming Friday. In the meantime he has been continuing to perform the dressing changes in such as previous. There does not appear to be any evidence of infection worsengin at this time. 07/10/18; ooSince I last saw this man 2 [name] ago things have actually  improved. IV antibiotics of resulted in less forefoot erythema although there is still some present. He is not systemically unwell ooLeft buttock wounds o2 now have no depth there is increased epithelialization Using silver alginate ooLeft heel still requires debridement using Iodoflex ooLeft dorsal foot still with [name] sizable wound about the size of [name] border but healthy granulation ooRight dorsal foot still with [name] slitlike area using silver alginate 07/18/18; the patient's cellulitis in the left foot is improved in fact I think it is on its way to resolving. ooLeft buttock wounds o2 both look better although the larger one has hypertension granulation we've been using silver alginate ooLeft heel has some thick circumferential redundant skin over the wound edge which will need to be removed today we've been using Iodoflex ooLeft dorsal foot is still [name] sizable wound required debridement using silver alginate ooThe right dorsal foot is just about closed only [name] small open area remains here 07/25/18; left foot cellulitis is resolved ooLeft buttock wounds o2 both look better. Hyper-granulation on the major area ooLeft heel as some debris over the surface but otherwise looks [name] healthier wound. Using silver collagen ooRight dorsal foot is just about closed 07/31/18; arrives with our intake nurse worried about purulent drainage from the buttock. We had hyper-granulation here last week ooHis buttock wounds o2 continue to look better ooLeft heel some debris over the surface but measuring smaller. ooRight dorsal foot unfortunately has openings between the toes ooLeft foot superficial wound looks less aggravated. 08/07/18 ooButtock wounds continue to look better although some of her granulation and the larger medial wound. silver alginate ooLeft heel continues to look [name] lot better.silver collagen ooLeft foot superficial wound looks less stable. Requires debridement. He has [name] new wound  superficial area on the foot on the lateral dorsal foot. ooRight foot looks better using silver alginate without Lotrisone 08/14/2018; patient was in the ER last week diagnosed with [name] UTI. He is now on Cefpodoxime and Macrodantin. ooButtock wounds continued to be smaller. Using silver alginate ooLeft heel continues to look better using silver collagen ooLeft foot superficial wound looks as though it is improving ooRight dorsal foot area is just about healed. 08/21/2018; patient is completed his antibiotics for his UTI. ooHe has 2 open areas on the buttocks. There is still not closed although the surface looks satisfactory. Using silver alginate ooLeft heel continues to improve using silver collagen ooThe bilateral dorsal foot areas which are at the base of his first and second toes/possible tinea pedis are actually stable on the left but worse on the right. The area on the left required debridement of necrotic surface. After debridement I obtained [name] specimen for PCR culture. ooThe right dorsal foot which is been just about healed last week is now reopened 08/28/2018; culture done on the left dorsal foot showed coag negative staph both staph epidermidis and Lugdunensis. I think  this is worthwhile initiating systemic treatment. I will use doxycycline given his long list of allergies. The area on the left heel slightly improved but still requiring debridement. ooThe large wound on the buttock is just about closed whereas the smaller one is larger. Using silver alginate in this area 09/04/2018; patient is completing his doxycycline for the left foot although this continues to be [name] very difficult wound area with very adherent necrotic debris. We are using silver alginate to all his wounds right foot left foot and the small wounds on his buttock, silver collagen on the left heel. 09/11/2018; once again this patient has intense erythema and swelling of the left forefoot. Lesser degrees of erythema  in the right foot. He has [name] long list of allergies and intolerances. I will reinstitute doxycycline. 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 Using silver alginate ooLeft heel also looks better using silver collagen ooUnfortunately both the areas on his feet look worse. The area on the left first second webspace is now gone through to the plantar part of his foot. The area on the left foot anteriorly is irritated with erythema and swelling in the forefoot. 09/25/2018 ooHis wound on the left plantar heel looks better. Using silver collagen ooThe area on the left buttock 2 small remnant areas. One is closed one is still open. Using silver alginate ooThe areas between both his first and second toes look worse. This in spite of long-standing antifungal therapy with ketoconazole and silver alginate which should have antifungal activity ooHe has small areas around his original wound on the left calf one is on the bottom of the original scar tissue and one superiorly both of these are small and superficial but again given wound history in this site this is worrisome 10/02/2018 ooLeft plantar heel continues to gradually contract using silver collagen ooLeft buttock wound is unchanged using silver alginate ooThe areas on his dorsal feet between his first and second toes bilaterally look about the same. 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 Small open area here. We have been using silver alginate 10/09/2018; ooLeft plantar heel continues to look healthy and contract. No debridement is required ooLeft buttock slightly smaller with [name] tape injury wound just below which was new this week ooDorsal feet somewhat improved I have been using clindamycin ooLeft  lateral looks lower extremity the actual open area looks worse although [name] lot of this is epithelialized. I am going to change to silver collagen today He has [name] lot more swelling in the right leg although this is not pitting not red and not particularly warm there is [name] lot of spasm in the right leg usually indicative of people with paralysis of some underlying discomfort. We have reviewed his vascular status from 2017 he had [name] left greater saphenous vein ablation. I wonder about referring him back to vascular surgery if the area on the left leg continues to deteriorate. 10/16/2018 in today for follow-up and management of multiple lower extremity ulcers. His left Buttock wound is much lower smaller and almost closed completely. The wound to the left ankle has began to reopen with Epithelialization and some adherent slough. He has multiple new areas to the left foot and leg. The left dorsal foot without much improvement. Wound present between left  great webspace and 2nd toe. Erythema and edema present right leg. Right LE ultrasound obtained on 10/10/18 was negative for DVT . 10/23/2018; ooLeft buttock is closed over. Still dry macerated skin but there is no open wound. I suspect this is chronic pressure/moisture ooLeft lateral calf is quite [name] bit worse than when I saw this last. There is clearly drainage here he has macerated skin into the left plantar heel. We will change the primary dressing to alginate ooLeft dorsal foot has some improvement in overall wound area. Still using clindamycin and silver alginate ooRight dorsal foot about the same as the left using clindamycin and silver alginate ooThe erythema in the right leg has resolved. He is DVT rule out was negative ooLeft heel pressure area required debridement although the wound is smaller and the surface is health 10/26/2018 ooThe patient came back in for his nurse check today predominantly because of the drainage coming out of the left  lateral leg with [name] recent reopening of his original wound on the left lateral calf. He comes in today with [name] large amount of surrounding erythema around the wound extending from the calf into the ankle and even in the area on the dorsal foot. He is not systemically unwell. He is not febrile. Nevertheless this looks like cellulitis. We have been using silver alginate to the area. I changed him to [name] regular visit and I am going to prescribe him doxycycline. The rationale here is [name] long list of medication intolerances and [name] history of MRSA. I did not see anything that I thought would provide [name] valuable culture 10/30/2018 ooFollow-up from his appointment 4 days ago with really an extensive area of cellulitis in the left calf left lateral ankle and left dorsal foot. I put him on doxycycline. He has [name] long list of medication allergies which are true allergy reactions. 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 In any case he is [name] lot better today. The erythema especially in the anterior and lateral left calf is better. He still has left ankle erythema. He also is complaining about increasing edema in the right leg we have only been using Kerlix Coban and he has been doing the wraps at home. Finally he has [name] spotty rash on the medial part of his upper left calf which looks like folliculitis or perhaps wrap occlusion type injury. Small superficial macules not pustules 11/06/18 patient arrives today with again [name] considerable degree of erythema around the wound on the left lateral calf extending into the dorsal ankle and dorsal foot. This is [name] lot worse than when I saw this last week. He is on doxycycline really with not [name] lot of improvement. He has not been systemically unwell Wounds on the; left heel actually looks improved. Original area on the left foot and proximity to the first and second toes looks about the same. He has superficial  areas on the dorsal foot, anterior calf and then the reopening of his original wound on the left lateral calf which looks about the same ooThe only area he has on the right is the dorsal webspace first and second which is smaller. ooHe has [name] large area of dry erythematous skin on the left buttock small open area here. 11/13/2018; the patient arrives in much better condition. The erythema around the wound on the left lateral calf is [name] lot better. Not sure whether this was the clindamycin or the TCA and ketoconazole  or just in the improvement in edema control [stasis dermatitis]. In any case this is [name] lot better. The area on the left heel is very small and just about resolved using silver collagen we have been using silver alginate to the areas on his dorsal feet 11/20/2018; his wounds include the left lateral calf, left heel, dorsal aspects of both feet just proximal to the first second webspace. He is stable to slightly improved. I did not think any changes to his dressings were going to be necessary 11/27/2018 he has [name] reopening on the left buttock which is surrounded by what looks like tinea or perhaps some other form of dermatitis. The area on the left dorsal foot has some erythema around it I have marked this area but I am not sure whether this is cellulitis or not. Left heel is not closed. Left calf the reopening is really slightly longer and probably worse 1/13; in general things look better and smaller except for the left dorsal foot. Area on the left heel is just about closed, left buttock looks better only [name] small wound remains in the skin looks better [using Lotrisone] 1/20; the area on the left heel only has [name] few remaining open areas here. Left lateral calf about the same in terms of size, left dorsal foot slightly larger right lateral foot still not closed. The area on the left buttock has no open wound and the surrounding skin looks [name] lot better 1/27; the area on the left heel is closed.  Left lateral calf better but still requiring extensive debridements. The area on his left buttock is closed. He still has the open areas on the left dorsal foot which is slightly smaller in the right foot which is slightly expanded. We have been using Iodoflex on these areas as well 2/3; left heel is closed. Left lateral calf still requiring debridement using Iodoflex there is no open area on his left buttock however he has dry scaly skin over [name] large area of this. Not really responding well to the Lotrisone. Finally the areas on his dorsal feet at the level of the first second webspace are slightly smaller on the right and about the same on the left. Both of these vigorously debrided with Anasept and gauze 2/10; left heel remains closed he has dry erythematous skin over the left buttock but there is no open wound here. Left lateral leg has come in and with. Still requiring debridement we have been using Iodoflex here. Finally the area on the left dorsal foot and right dorsal foot are really about the same extremely dry callused fissured areas. He does not yet have [name] dermatology appointment 2/17; left heel remains closed. He has [name] new open area on the left buttock. The area on the left lateral calf is bigger longer and still covered in necrotic debris. No major change in his foot areas bilaterally. I am awaiting for [name] dermatologist to look on this. We have been using ketoconazole I do not know that this is been doing any good at all. 2/24; left heel remains closed. The left buttock wound that was new reopening last week looks better. The left lateral calf appears better also although still requires debridement. The major area on his foot is the left first second also requiring debridement. We have been putting Prisma on all wounds. I do not believe that the ketoconazole has done too much good for his feet. He will use Lotrisone I am going to give him [name] 2-week  course of terbinafine. We still do not have  [name] dermatology appointment 3/2 left heel remains closed however there is skin over bone in this area I pointed this out to him today. The left buttock wound is epithelialized but still does not look completely stable. The area on the left leg required debridement were using silver collagen here. With regards to his feet we changed to Lotrisone last week and silver alginate. 3/9; left heel remains closed. Left buttock remains closed. The area on the right foot is essentially closed. The left foot remains unchanged. Slightly smaller on the left lateral calf. Using silver collagen to both of these areas 3/16-Left heel remains closed. Area on right foot is closed. Left lateral calf above the lateral malleolus open wound requiring debridement with easy bleeding. Left dorsal wound proximal to first toe also debrided. Left ischial area open new. Patient has been using Prisma with wrapping every 3 days. Dermatology appointment is apparently tomorrow.Patient has completed his terbinafine 2-week course with some apparent improvement according to him, there is still flaking and dry skin in his foot on the left 3/23; area on the right foot is reopened. The area on the left anterior foot is about the same still [name] very necrotic adherent surface. He still has the area on the left leg and reopening is on the left buttock. He apparently saw dermatology although I do not have [name] note. According to the patient who is usually fairly well informed they did not have any good ideas. Put him on oral terbinafine which she is been on before. 3/30; using silver collagen to all wounds. Apparently his dermatologist put him on doxycycline and rifampin presumably some culture grew staph. I do not have this result. He remains on terbinafine although I have used terbinafine on him before 4/6; patient has had [name] fairly substantial reopening on the right foot between the first and second toes. He is finished his terbinafine and I believe  is on doxycycline and rifampin still as prescribed by dermatology. We have been using silver collagen to all his wounds although the patient reports that he thinks silver alginate does better on the wounds on his buttock. 4/13; the area on his left lateral calf about the same size but it did not require debridement. ooLeft dorsal foot just proximal to the webspace between the first and second toes is about the same. Still nonviable surface. I note some superficial bronze discoloration of the dorsal part of his foot ooRight dorsal foot just proximal to the first and second toes also looks about the same. I still think there may be the same discoloration I noted above on the left ooLeft buttock wound looks about the same 4/20; left lateral calf appears to be gradually contracting using silver collagen. 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 The left dorsal foot wound is debrided of tightly adherent necrotic debris and really cleans up quite nicely. The right area is worse with expansion. I did not debride this it is now over the base of the second toe ooThe area on his left buttock is smaller no debridement is required using silver collagen 5/4; left calf continues to make good progress. ooHe arrives with erythema around the wounds on his dorsal foot which even extends to the plantar aspect. Very concerning for coexistent infection. He is finished the erythromycin I gave him for possible erythrasma this does not seem to have helped. ooThe area on the left foot is about  the same base of the dorsal toes ooIs area on the buttock looks improved on the left 5/11; left calf and left buttock continued to make good progress. Left foot is about the same to slightly improved. ooMajor problem is on the right foot. He has not had an x-ray. Deep tissue culture I did last week showed both Enterobacter and E. coli. I did not change  the doxycycline I put him on empirically although neither 1 of these were plated to doxycycline. He arrives today with the erythema looking worse on both the dorsal and plantar foot. Macerated skin on the bottom of the foot. he has not been systemically unwell 5/18-Patient returns at 1 week, left calf wound appears to be making some progress, left buttock wound appears slightly worse than last time, left foot wound looks slightly better, right foot redness is marginally better. X-ray of both feet show no air or evidence of osteomyelitis. Patient is finished his Omnicef and terbinafine. He continues to have macerated skin on the bottom of the left foot as well as right 5/26; left calf wound is better, left buttock wound appears to have multiple small superficial open areas with surrounding macerated skin. X-rays that I did last time showed no evidence of osteomyelitis in either foot. He is finished cefdinir and doxycycline. I do not think that he was on terbinafine. He continues to have [name] large superficial open area on the right foot anterior dorsal and slightly between the first and second toes. I did send him to dermatology 2 months ago or so wondering about whether they would do [name] fungal scraping. I do not believe they did but did do [name] culture. We have been using silver alginate to the toe areas, he has been using antifungals at home topically either ketoconazole or Lotrisone. We are using silver collagen on the left foot, silver alginate on the right, silver collagen on the left lateral leg and silver alginate on the left buttock 6/1; left buttock area is healed. We have the left dorsal foot, left lateral leg and right dorsal foot. We are using silver alginate to the areas on both feet and silver collagen to the area on his left lateral calf 6/8; the left buttock apparently reopened late last week. He is not really sure how this happened. He is tolerating the terbinafine. Using silver alginate to  all wounds 6/15; left buttock wound is larger than last week but still superficial. ooCame in the clinic today with [name] report of purulence from the left lateral leg I did not identify any infection ooBoth areas on his dorsal feet appear to be better. He is tolerating the terbinafine. Using silver alginate to all wounds 6/22; left buttock is about the same this week, left calf quite [name] bit better. His left foot is about the same however he comes in with erythema and warmth in the right forefoot once again. Culture that I gave him in the beginning of May showed Enterobacter and E. coli. I gave him doxycycline and things seem to improve although neither 1 of these organisms was specifically plated. 6/29; left buttock is larger and dry this week. Left lateral calf looks to me to be improved. Left dorsal foot also somewhat improved right foot completely unchanged. The erythema on the right foot is still present. He is completing the Ceftin dinner that I gave him empirically [see discussion above.) 7/6 - All wounds look to be stable and perhaps improved, the left buttock wound is slightly smaller, per  patient bleeds easily, completed ceftin, the right foot redness is less, he is on terbinafine 7/13; left buttock wound about the same perhaps slightly narrower. Area on the left lateral leg continues to narrow. Left dorsal foot slightly smaller right foot about the same. We are using silver alginate on the right foot and Hydrofera Blue to the areas on the left. Unna boot on the left 2 layer compression on the right 7/20; left buttock wound absolutely the same. Area on lateral leg continues to get better. Left dorsal foot require debridement as did the right no major change in the 7/27; left buttock wound the same size necrotic debris over the surface. The area on the lateral leg is closed once again. His left foot looks better right foot about the same although there is some involvement now of the posterior  first second toe area. He is still on terbinafine which I have given him for [name] month, not certain [name] centimeter major change 06/25/19-All wounds appear to be slightly improved according to report, left buttock wound looks clean, both foot wounds have minimal to no debris the right dorsal foot has minimal slough. We are using Hydrofera Blue to the left and silver alginate to the right foot and ischial wound. 8/10-Wounds all appear to be around the same, the right forefoot distal part has some redness which was not there before, however the wound looks clean and small. Ischial wound looks about the same with no changes 8/17; his wound on the left lateral calf which was his original chronic venous insufficiency wound remains closed. Since I last saw him the areas on the left dorsal foot right dorsal foot generally appear better but require debridement. The area on his left initial tuberosity appears somewhat larger to me perhaps hyper granulated and bleeds very easily. We have been using Hydrofera Blue to the left dorsal foot and silver alginate to everything else 8/24; left lateral calf remains closed. The areas on his dorsal feet on the webspace of the first and second toes bilaterally both look better. The area on the left buttock which is the pressure ulcer stage II slightly smaller. I change the dressing to Hydrofera Blue to all areas 8/31; left lateral calf remains closed. The area on his dorsal feet bilaterally look better. Using Hydrofera Blue. Still requiring debridement on the left foot. No change in the left buttock pressure ulcers however 9/14; left lateral calf remains closed. Dorsal feet look quite [name] bit better than 2 [name] ago. Flaking dry skin also [name] lot better with the ammonium lactate I gave him 2 [name] ago. The area on the left buttock is improved. He states that his Roho cushion developed [name] leak and he is getting [name] new one, in the interim he is offloading this vigorously 9/21; left calf  remains closed. Left heel which was [name] possible DTI looks better this week. He had macerated tissue around the left dorsal foot right foot looks satisfactory and improved left buttock wound. I changed his dressings to his feet to silver alginate bilaterally. Continuing Hydrofera Blue on the left buttock. 9/28 left calf remains closed. Left heel did not develop anything [possible DTI] dry flaking skin on the left dorsal foot. Right foot looks satisfactory. Improved left buttock wound. We are using silver alginate on his feet Hydrofera Blue on the buttock. I have asked him to go back to the Lotrisone on his feet including the wounds and surrounding areas 10/5; left calf remains closed. The areas on the  left and right feet about the same. [name] lot of this is epithelialized however debris over the remaining open areas. He is using Lotrisone and silver alginate. The area on the left buttock using Hydrofera Blue 10/26. Patient has been out for 3 [name] secondary to Covid concerns. He tested negative but I think his wife tested positive. He comes in today with the left foot substantially worse, right foot about the same. Even more concerning he states that the area on his left buttock closed over but then reopened and is considerably deeper in one aspect than it was before [stage III wound] 11/2; left foot really about the same as last week. Quarter sized wound on the dorsal foot just proximal to the first second toes. Surrounding erythema with areas of denuded epithelium. This is not really much different looking. Did not look like cellulitis this time however. ooRight foot area about the same.. We have been using silver alginate alginate on his toes ooLeft buttock still substantial irritated skin around the wound which I think looks somewhat better. We have been using Hydrofera Blue here. 11/9; left foot larger than last week and [name] very necrotic surface. Right foot I think is about the same perhaps slightly  smaller. Debris around the circumference also addressed. Unfortunately on the left buttock there is been [name] decline. Satellite lesions below the major wound distally and now [name] an additional one posteriorly we have been using Hydrofera Blue but I think this is [name] pressure issue 11/16; left foot ulcer dorsally again [name] very adherent necrotic surface. Right foot is about the same. Not much change in the pressure ulcer on his left buttock. 11/30; left foot ulcer dorsally basically the same as when I saw him 2 [name] ago. Very adherent fibrinous debris on the wound surface. Patient reports [name] lot of drainage as well. 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 We have been using Iodoflex, patient changed back to alginate because of the drainage. Area on his right dorsal foot really looks benign with [name] healthier surface certainly [name] lot better than on the left. Left buttock wounds all improved using Hydrofera Blue 12/7; left dorsal foot again no improvement. Tightly adherent debris. PCR culture I did last week only showed likely skin contaminant. I have gone ahead and done [name] punch biopsy of this which is about the last thing in terms of investigations I can think to do. He has known venous insufficiency and venous hypertension and this could be the issue here. The area on the right foot is about the same left buttock slightly worse according to our intake nurse secondary to [facility] Blue sticking to the wound 12/14; biopsy of the left foot that I did last time showed changes that could be related to wound healing/chronic stasis dermatitis phenomenon no neoplasm. We have been using silver alginate to both feet. I change the one on the left today to Sorbact and silver alginate to his other 2 wounds 12/28; the patient arrives with the following problems; ooMajor issue is the dorsal left foot which continues to be [name] larger deeper wound area. Still with [name] completely  nonviable surface ooParadoxically the area mirror image on the right on the right dorsal foot appears to be getting better. ooHe had some loss of dry denuded skin from the lower part of his original wound on the left lateral calf. Some of this area looked [name] little vulnerable and for this reason we put him in  wrap that on this side this week ooThe area on his left buttock is larger. He still has the erythematous circular area which I think is [name] combination of pressure, sweat. This does not look like cellulitis or fungal dermatitis 11/26/2019; -Dorsal left foot large open wound with depth. Still debris over the surface. Using Sorbact ooThe area on the dorsal right foot paradoxically has closed over [facility] has [name] reopening on the left ankle laterally at the base of his original wound that extended up into the calf. This appears clean. ooThe left buttock wound is smaller but with very adherent necrotic debris over the surface. We have been using silver alginate here as well The patient had arterial studies done in 2017. He had biphasic waveforms at the dorsalis pedis and posterior tibial bilaterally. ABI in the left was 1.17. Digit waveforms were dampened. He has slight spasticity in the great toes I do not think [name] TBI would be possible 1/11; the patient comes in today with [name] sizable reopening between the first and second toes on the right. This is not exactly in the same location where we have been treating wounds previously. According to our intake nurse this was actually fairly deep but 0.6 cm. The area on the left dorsal foot looks about the same the surface is somewhat cleaner using Sorbact, his MRI is in 2 days. We have not managed yet to get arterial studies. The new reopening on the left lateral calf looks somewhat better using alginate. The left buttock wound is about the same using alginate 1/18; the patient had his ARTERIAL studies which were quite normal. ABI in the right at 1.13 with  triphasic/biphasic waveforms on the left ABI 1.06 again with triphasic/biphasic waveforms. It would not have been possible to have done [name] toe brachial index because of spasticity. We have been using Sorbac to the left foot alginate to the rest of his wounds on the right foot left lateral calf and left buttock 1/25; arrives in clinic with erythema and swelling of the left forefoot worse over the first MTP area. This extends laterally dorsally and but also posteriorly. Still has an area on the left lateral part of the lower part of his calf wound it is eschared and clearly not closed. ooArea on the left buttock still with surrounding irritation and erythema. ooRight foot surface wound dorsally. The area between the right and first and second toes appears better. 2/1; ooThe left foot wound is about the same. Erythema slightly better I gave him [name] week of doxycycline empirically ooRight foot wound is more extensive extending between the toes to the plantar surface ooLeft lateral calf really no open surface on the inferior part of his original wound however the entire area still looks vulnerable ooAbsolutely no improvement in the left buttock wound required debridement. 2/8; the left foot is about the same. Erythema is slightly improved I gave him clindamycin last week. ooRight foot looks better he is using Lotrimin and silver alginate ooHe has [name] breakdown in the left lateral calf. Denuded epithelium which I have removed ooLeft buttock about the same were using Hydrofera Blue 2/15; left foot is about the same there is less surrounding erythema. Surface still has tightly adherent debris which I have debriding however not making any progress ooRight foot has [name] substantial wound on the medial right second toe between the first and second webspace. ooStill an open area on the left lateral calf distal area. ooButtock wound is about the same 2/22; left foot  is about the same less surrounding  erythema. Surface has adherent debris. Polymen Ag Right foot area significant wound between the first and second toes. We have been using silver alginate here Left lateral leg polymen Ag at the base of his original venous insufficiency wound ooLeft buttock some improvement here 3/1; ooRight foot is deteriorating in the first second toe webspace. Larger and more substantial. We have been using silver alginate. ooLeft dorsal foot about the same markedly adherent surface debris using PolyMem Ag ooLeft lateral calf surface debris using PolyMem AG ooLeft buttock is improved again using PolyMem Ag. ooHe is completing his terbinafine. The erythema in the foot seems better. He has been on this for 2 [name] 3/8; no improvement in any wound area in fact he has [name] small open area on the dorsal midfoot which is new this week. He has not gotten his foot x-rays yet 3/15; his x-rays were both negative for osteomyelitis of both feet. No major change in any of his wounds on the extremities however his buttock wounds are better. We have been using polymen on the buttocks, left lower leg. Iodoflex on the left foot and silver alginate on the right 3/22; arrives in clinic today with the 2 major issues are the improvement in the left dorsal foot wound which for once actually looks healthy with [name] nice healthy wound surface without debridement. Using Iodoflex here. Unfortunately on the left lateral calf which is in the distal part of his original wound he came to the clinic here for there was purulent drainage noted some increased breakdown scattered around the original area and [name] small area proximally. We we are using polymen here will change to silver alginate today. His buttock wound on the left is better and I think the area on the right first second toe webspace is also improved 3/29; left dorsal foot looks better. Using Iodoflex. Left ankle culture from deterioration last time grew E. coli, Enterobacter and  Enterococcus. I will give him [name] course of cefdinir although that will not cover Enterococcus. The area on the right foot in the webspace of the first and second toe lateral first toe looks better. The area on his buttock is about healed Vascular appointment is on April 21. This is to look at his venous system vis--vis continued breakdown of the wounds on the left including the left lateral leg and left dorsal foot he. He has had previous ablations on this side 4/5; the area between the right first and second toes lateral aspect of the first toe looks better. Dorsal aspect of the left first toe on the left foot also improved. Unfortunately the left lateral lower leg is larger and there is [name] second satellite wound superiorly. The usual superficial abrasions on the left buttock overall better but certainly not closed 4/12; the area between the right first and second toes is improved. Dorsal aspect of the left foot also slightly smaller with [name] vibrant healthy looking surface. No real change in the left lateral leg and the left buttock wound is healed He has an unaffordable co-pay for Apligraf. Appointment with vein and vascular with regards to the left leg venous part of the circulation is on 4/21 4/19; we continue to see improvement in all wound areas. Although this is minor. He has his vascular appointment on 4/21. The area on the left buttock has not reopened although right in the center of this area the skin looks somewhat threatened 4/26; the left buttock is unfortunately reopened. In  general his left dorsal foot has [name] healthy surface and looks somewhat smaller although it was not measured as such. The area between his first and second toe webspace on the right as [name] small wound against the first toe. The patient saw vascular surgery. The real question I was asking was about the small saphenous vein on the left. He has previously ablated left greater saphenous vein. Nothing further was commented on  on the left. Right greater saphenous vein without reflux at the saphenofemoral junction or proximal thigh there was no indication for ablation of the right greater saphenous vein duplex was negative for DVT bilaterally. They did not think there was anything from [name] point of view that could be offered. They ABIs within normal limits 5/3; only small open area on the left buttock. The area on the left lateral leg which was his original venous reflux is now 2 wounds both which look clean. We are using Iodoflex on the left dorsal foot which looks healthy and smaller. He is down to [name] very tiny area between the right first and second toes, using silver alginate 5/10; all of his wounds appear better. We have much better edema control in 4 layer compression on the left. This may be the factor that is allowing the left foot and left lateral calf to heal. He has external compression garments at home 04/14/20-All of his wounds are progressing well, the left forefoot is practically closed, left ischium appears to be about the same, right toe webspace is also smaller. The left lateral leg is about the same, continue using Hydrofera Blue to this, silver alginate to the ischium, Iodoflex to the toe space on the right 6/7; most of his wounds outside of the left buttock are doing well. The area on the left lateral calf and left dorsal foot are smaller. The area on the right foot in between the first and second toe webspace is barely visible although he still says there is some drainage here is the only reason I did not heal this out. ooUnfortunately the area on the left buttock almost looks like he has [name] skin tear from tape. He has open wound and then [name] large flap of skin that we are trying to get adherence over an area just next to the remaining wound 6/21; 2 week follow-up. I believe is been here for nurse visits. Miraculously the area between his first and second toes on the left dorsal foot is closed  over. Still open on the right first second web space. The left lateral calf has 2 open areas. Distally this is more superficial. The proximal area had [name] little more depth and required debridement of adherent necrotic material. His buttock wound is actually larger we have been using silver alginate here 6/28; the patient's area on the left foot remains closed. Still open wet area between the first and second toes on the right and also extending into the plantar aspect. We have been using silver alginate in this location. He has 2 areas on the left lower leg part of his original long wounds which I think are better. We have been using Hydrofera Blue here. Hydrofera Blue to the left buttock which is stable 7/12; left foot remains closed. Left ankle is closed. May be [name] small area between his right first and second toes the only truly open area is on the left buttock. We have been using Hydrofera Blue here 7/19; patient arrives with marked deterioration especially in the left  foot and ankle. We did not put him in [name] compression wrap on the left last week in fact he wore his juxta lite stockings on either side although he does not have an underlying stocking. He has [name] reopening on the left dorsal foot, left lateral ankle and [name] new area on the right dorsal ankle. More worrisome is the degree of erythema on the left foot extending on the lateral foot into the lateral lower leg on the left 7/26; the patient had erythema and drainage from the lateral left ankle last week. Culture of this grew MRSA resistant to doxycycline and clindamycin which are the 2 antibiotics we usually use with this patient who has multiple antibiotic allergies including linezolid, trimethoprim sulfamethoxazole. I had give him an empiric doxycycline and he comes in the area certainly looks somewhat better although it is blotchy in his lower leg. He has not been systemically unwell. He has had areas on the left dorsal foot which is [name]  reopening, chronic wounds on the left lateral ankle. Both of these I think are secondary to chronic venous insufficiency. The area between his first and second toes is closed as far as I can tell. He had [name] new wrap injury on the right dorsal ankle last week. Finally he has an area on the left buttock. We have been using silver alginate to everything except the left buttock we are using Hydrofera Blue 06/30/20-Patient returns at 1 week, has been given [name] sample dose pack of NUZYRA which is [name] tetracycline derivative [omadacycline], patient has completed those, we have been using silver alginate to almost all the wounds except the left ischium where we are using Hydrofera Blue all of them look better 8/16; since I last saw the patient he has been doing well. The area on the left buttock, left lateral ankle and left foot are all closed today. He has completed the [place] I gave him last time and tolerated this well. He still has open areas on the right dorsal ankle and in the right first second toe area which we are using silver alginate. 8/23; we put him in his bilateral external compression stockings last week as he did not have anything open on either leg except for concerning area between the right first and second toe. He comes in today with an area on the left dorsal foot slightly more proximal than the original wound, the left lateral foot but this is actually [name] continuation of the area he had on the left lateral ankle from last time. As well he is opened up on the left buttock again. 8/30; comes in today with things looking [name] lot better. The area on the left lower ankle has closed down as has the left foot but with eschar in both areas. The area on the dorsal right ankle is also epithelialized. Very little remaining of the left buttock wound. We have been using silver alginate on all wound areas 9/13; the area in the first second toe webspace on the right has fully epithelialized. He still has some  vulnerable epithelium on the right and the ankle and the dorsal foot. He notes weeping. He is using his juxta lite stocking. On the left again the left dorsal foot is closed left lateral ankle is closed. We went to the juxta lite stocking here as well. ooStill vulnerable in the left buttock although only 2 small open areas remain here 9/27; 2-week follow-up. We did not look at his left leg but the patient says  everything is closed. He is [name] bit disturbed by the amount of edema in his left foot he is using juxta lite stockings but asking about over the toes stockings which would be 30/40, will talk to him next time. According to him there is no open wound on either the left foot or the left ankle/calf He has an open area on the dorsal right calf which I initially point [name] wrap injury. He has superficial remaining wound on the left ischial tuberosity been using silver alginate although he says this sticks to the wound 10/5; we gave him 2-week follow-up but he called yesterday expressing some concerns about his right foot right ankle and the left buttock. He came in early. There is still no open areas on the left leg and that still in his juxta lite stocking 10/11; he only has 1 small area on the left buttock that remains measuring millimeters 1 mm. Still has the same irritated skin in this area. We recommended zinc oxide when this eventually closes and pressure relief is meticulously is he can do this. He still has an area on the dorsal part of his right first through third toes which is [name] bit irritated and still open and on the dorsal ankle near the crease of the ankle. We have been using silver alginate and using his own stocking. He has nothing open on the left leg or foot 10/25; 2-week follow-up. Not nearly as good on the left buttock as I was hoping. For open areas with 5 looking threatened small. He has the erythematous irritated chronic skin in this area. oo1 area on the right dorsal ankle. He  reports this area bleeds easily ooRight dorsal foot just proximal to the base of his toes ooWe have been using silver alginate. Objective Constitutional Sitting or standing Blood Pressure is within target range for patient.. Pulse regular and within target range for patient.Marland Kitchen Respirations regular, non-labored and within target range.. Temperature is normal and within the target range for the patient.Marland Kitchen Appears in no distress. Vitals Time Taken: 7:58 AM, Height: 70 in, Source: Stated, Weight: 216 lbs, Source: Stated, BMI: 31, Temperature: 98.4 F, Pulse: 120 bpm, Respiratory Rate: 18 breaths/min, Blood Pressure: 127/85 mmHg. General Notes: Wound exam; left buttock now for on its way to 5 open areas. Same irritated skin from pressure, friction and wetness. ooArea on the right ankle apparently bleeds easily ooDorsal right foot I think is edematous I think the patient has chronic venous insufficiency. I have talked to him about this in some detail. I did not specifically see anything between his toes Integumentary (Hair, Skin) Wound #38R status is Open. Original cause of wound was Gradually Appeared. The wound is located on the Right T - Web between 1st and 2nd. The wound oe measures 0.3cm length x 0.5cm width x 0.1cm depth; 0.118cm^2 area and 0.012cm^3 volume. There is Fat Layer (Subcutaneous Tissue) exposed. There is no tunneling or undermining noted. There is [name] small amount of serous drainage noted. The wound margin is flat and intact. There is large (67-100%) pink granulation within the wound bed. There is no necrotic tissue within the wound bed. Wound #41R status is Open. Original cause of wound was Gradually Appeared. The wound is located on the Left Ischium. The wound measures 1.4cm length x 3.1cm width x 0.1cm depth; 3.409cm^2 area and 0.341cm^3 volume. There is Fat Layer (Subcutaneous Tissue) exposed. There is no tunneling or undermining noted. There is [name] medium amount of serosanguineous  drainage noted. The  wound margin is distinct with the outline attached to the wound base. There is large (67-100%) red, friable granulation within the wound bed. There is no necrotic tissue within the wound bed. Wound #46 status is Open. Original cause of wound was Gradually Appeared. The wound is located on the Right,Dorsal Ankle. The wound measures 0.8cm length x 0.8cm width x 0.1cm depth; 0.503cm^2 area and 0.05cm^3 volume. There is Fat Layer (Subcutaneous Tissue) exposed. There is no tunneling or undermining noted. There is [name] small amount of serosanguineous drainage noted. The wound margin is flat and intact. There is large (67-100%) red granulation within the wound bed. There is no necrotic tissue within the wound bed. 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 Dressing Change Frequency: Wound #38R Right T - Web between 1st and 2nd: oe Change Dressing every other day. Wound #41R Left Ischium: Change Dressing every other day. Wound #46 Right,Dorsal Ankle: Change Dressing every other day. Skin Barriers/Peri-Wound Care: Moisturizing lotion - both legs daily Other: - lotrimen to periwound of right foot webbing 1st / 2nd toe Wound Cleansing: May shower and wash wound with soap and water. - on days that dressing is changed Primary Wound Dressing: Wound #38R Right T - Web between 1st and 2nd: oe Calcium Alginate with Silver Wound #41R Left Ischium: Calcium Alginate with  Silver Wound #46 Right,Dorsal Ankle: Hydrofera Blue - Ready Secondary Dressing: Wound #38R Right T - Web between 1st and 2nd: oe Kerlix/Rolled Gauze Dry Gauze Wound #41R Left Ischium: Foam Border - or ABD pad and tape Wound #46 Right,Dorsal Ankle: Foam Border Edema Control: Elevate legs to the level of the heart or above for 30 minutes daily and/or when sitting, [name] frequency of: - throughout the day Support Garment 30-40 mm/Hg pressure to: - Juxtalite to both legs daily Off-Loading: Low air-loss mattress (Group 2) Roho cushion for wheelchair Turn and reposition every 2 hours - out of wheelchair throughout the day, try to lay on sides, sleep in the bed not the recliner 1. I change the area on the ankle to [facility] Blue 2. Still silver alginate to the toes with Lotrimin underneath and to the buttock. 3. I do not know how to get around the buttock area here I think this is [name] pressure friction and moisture issue I have talked to him about offloading this Electronic Signature(s) Signed: 09/15/2020 5:10:17 PM By: Linton Ham MD Entered By: Linton Ham on 09/15/2020 08:29:20 -------------------------------------------------------------------------------- SuperBill Details Patient Name: Date of Service: [name] [name], [name] [name] 09/15/2020 Medical Record Number: [id number] Patient Account Number: [id number] Date of Birth/Sex: Treating RN: [birth year] (34 y.o. [name] [name] [name] Primary Care Provider: [place] [place] Other Clinician: Referring Provider: Treating Provider/Extender: [name] [name] [name] in Treatment: 245 Diagnosis Coding ICD-10 Codes Code Description I87.332 Chronic venous hypertension (idiopathic) with ulcer and inflammation of left lower extremity L97.321 Non-pressure chronic ulcer of left ankle limited to breakdown of skin L97.311 Non-pressure chronic ulcer of right ankle limited to breakdown of skin L97.511 Non-pressure chronic ulcer of other part of right  foot limited to  breakdown of skin L97.521 Non-pressure chronic ulcer of other part of left foot limited to breakdown of 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, 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, complete Facility Procedures CPT4 Code: [id number] Description: 99214 - WOUND CARE VISIT-LEV 4 EST PT Modifier: Quantity: 1 Physician Procedures : CPT4 Code Description Modifier [id number] - WC PHYS LEVEL 3 - EST PT ICD-10 Diagnosis Description K80.034 Non-pressure chronic ulcer of right 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

## 2020-09-15 NOTE — Progress Notes (Signed)
[place], KILO ESHELMAN ([id number]) Visit Report for 09/15/2020 Arrival Information Details Patient Name: Date of Service: Colavito, [name] 09/15/2020 7:30 [name] M Medical Record Number: [id number] Patient Account Number: [id number] Date of Birth/Sex: Treating RN: Dec 30, 1985 (34 y.o. [name] Primary Care Rayen Palen: [place], [place] Other Clinician: Referring Ciani Rutten: Treating Alaijah Gibler/Extender: [name]: 17 Visit Information History Since Last Visit Added or deleted any medications: Yes Patient Arrived: Wheel Chair Any new allergies or adverse reactions: No Arrival Time: 07:46 Had [name] fall or experienced change in No Accompanied By: self activities of daily living that may affect Transfer Assistance: None risk of falls: Patient Identification Verified: Yes Signs or symptoms of abuse/neglect since last visito No Secondary Verification Process Completed: Yes Hospitalized since last visit: No Patient Requires Transmission-Based Precautions: No Implantable device outside of the clinic excluding No Patient Has Alerts: Yes cellular tissue based products placed in the center Patient Alerts: R ABI = 1.0 since last visit: L ABI = 1.1 Has Dressing in Place as Prescribed: Yes Has Compression in Place as Prescribed: Yes Pain Present Now: No Electronic Signature(s) Signed: 09/15/2020 5:06:51 PM By: Baruch Gouty RN, BSN Entered By: Baruch Gouty on 09/15/2020 07:46:59 -------------------------------------------------------------------------------- Clinic Level of Care Assessment Details Patient Name: Date of Service: Gilkes, [name] [name] 09/15/2020 7:30 [name] M Medical Record Number: [id number] Patient Account Number: [id number] Date of Birth/Sex: Treating RN: 1986/11/14 (34 y.o. [name] Primary Care Jeliyah Middlebrooks: [place], [place] Other Clinician: Referring Sophy Mesler: Treating Brae Schaafsma/Extender: [name]:  245 Clinic Level of Care Assessment Items TOOL 4 Quantity Score X- 1 0 Use when only an EandM is performed on FOLLOW-UP visit ASSESSMENTS - Nursing Assessment / Reassessment X- 1 10 Reassessment of Co-morbidities (includes updates in patient status) X- 1 5 Reassessment of Adherence to Treatment Plan ASSESSMENTS - Wound and Skin [name] ssessment / Reassessment []  - 0 Simple Wound Assessment / Reassessment - one wound X- 3 5 Complex Wound Assessment / Reassessment - multiple wounds []  - 0 Dermatologic / Skin Assessment (not related to wound area) ASSESSMENTS - Focused Assessment []  - 0 Circumferential Edema Measurements - multi extremities []  - 0 Nutritional Assessment / Counseling / Intervention X- 1 5 Lower Extremity Assessment (monofilament, tuning fork, pulses) []  - 0 Peripheral Arterial Disease Assessment (using hand held doppler) ASSESSMENTS - Ostomy and/or Continence Assessment and Care []  - 0 Incontinence Assessment and Management []  - 0 Ostomy Care Assessment and Management (repouching, etc.) PROCESS - Coordination of Care X - Simple Patient / Family Education for ongoing care 1 15 []  - 0 Complex (extensive) Patient / Family Education for ongoing care X- 1 10 Staff obtains Programmer, systems, Records, T Results / Process Orders est []  - 0 Staff telephones HHA, Nursing Homes / Clarify orders / etc []  - 0 Routine Transfer to another Facility (non-emergent condition) []  - 0 Routine Hospital Admission (non-emergent condition) []  - 0 New Admissions / Biomedical engineer / Ordering NPWT Apligraf, etc. , []  - 0 Emergency Hospital Admission (emergent condition) X- 1 10 Simple Discharge Coordination []  - 0 Complex (extensive) Discharge Coordination PROCESS - Special Needs []  - 0 Pediatric / Minor Patient Management []  - 0 Isolation Patient Management []  - 0 Hearing / Language / Visual special needs []  - 0 Assessment of Community assistance (transportation, D/C  planning, etc.) []  - 0 Additional assistance / Altered mentation []  - 0 Support Surface(s) Assessment (bed, cushion, seat, etc.) INTERVENTIONS - Wound Cleansing / Measurement []  -  0 Simple Wound Cleansing - one wound X- 3 5 Complex Wound Cleansing - multiple wounds X- 1 5 Wound Imaging (photographs - any number of wounds) []  - 0 Wound Tracing (instead of photographs) []  - 0 Simple Wound Measurement - one wound X- 3 5 Complex Wound Measurement - multiple wounds INTERVENTIONS - Wound Dressings X - Small Wound Dressing one or multiple wounds 3 10 []  - 0 Medium Wound Dressing one or multiple wounds []  - 0 Large Wound Dressing one or multiple wounds X- 1 5 Application of Medications - topical []  - 0 Application of Medications - injection INTERVENTIONS - Miscellaneous []  - 0 External ear exam []  - 0 Specimen Collection (cultures, biopsies, blood, body fluids, etc.) []  - 0 Specimen(s) / Culture(s) sent or taken to Lab for analysis []  - 0 Patient Transfer (multiple staff / Civil Service fast streamer / Similar devices) []  - 0 Simple Staple / Suture removal (25 or less) []  - 0 Complex Staple / Suture removal (26 or more) []  - 0 Hypo / Hyperglycemic Management (close monitor of Blood Glucose) []  - 0 Ankle / Brachial Index (ABI) - do not check if billed separately X- 1 5 Vital Signs Has the patient been seen at the hospital within the last three years: Yes Total Score: 145 Level Of Care: New/Established - Level 4 Electronic Signature(s) Signed: 09/15/2020 5:02:41 PM By: Levan Hurst RN, BSN Entered By: Levan Hurst on 09/15/2020 10:22:36 -------------------------------------------------------------------------------- Encounter Discharge Information Details Patient Name: Date of Service: [place], [name] [name] 09/15/2020 7:30 [name] M Medical Record Number: [id number] Patient Account Number: [id number] Date of Birth/Sex: Treating RN: 1986/04/02 (34 y.o. [name] Primary Care Ruie Sendejo:  [place], [place] Other Clinician: Referring Helen Cuff: Treating Maxey Ransom/Extender: [name]: [phone] Encounter Discharge Information Items Discharge Condition: Stable Ambulatory Status: Wheelchair Discharge Destination: Home Transportation: Private Auto Accompanied By: self Schedule Follow-up Appointment: Yes Clinical Summary of Care: Electronic Signature(s) Signed: 09/15/2020 5:04:57 PM By: Deon Pilling Entered By: Deon Pilling on 09/15/2020 08:29:17 -------------------------------------------------------------------------------- Lower Extremity Assessment Details Patient Name: Date of Service: Mccutchen, [name] [name] 09/15/2020 7:30 [name] M Medical Record Number: [id number] Patient Account Number: [id number] Date of Birth/Sex: Treating RN: 06-05-1986 (34 y.o. [name] Primary Care Bearl Talarico: [place], [place] Other Clinician: Referring Keigan Tafoya: Treating Cindi Ghazarian/Extender: [name]: 245 Edema Assessment Assessed: [Left: No] [Right: No] Edema: [Left: Ye] [Right: s] Calf Left: Right: Point of Measurement: 33 cm From Medial Instep 32.5 cm Ankle Left: Right: Point of Measurement: 10 cm From Medial Instep 24 cm Vascular Assessment Pulses: Dorsalis Pedis Palpable: [Right:Yes] Electronic Signature(s) Signed: 09/15/2020 5:06:51 PM By: Baruch Gouty RN, BSN Entered By: Baruch Gouty on 09/15/2020 07:59:30 -------------------------------------------------------------------------------- Multi Wound Chart Details Patient Name: Date of Service: [place], [name] [name] 09/15/2020 7:30 [name] M Medical Record Number: [id number] Patient Account Number: [id number] Date of Birth/Sex: Treating RN: January 20, 1986 (34 y.o. [name] Primary Care Hattie Aguinaldo: [name], [name] Other Clinician: Referring Dakoda Laventure: Treating Anelis Hrivnak/Extender: [name]: 245 Vital Signs Height(in):  70 Pulse(bpm): 120 Weight(lbs): 216 Blood Pressure(mmHg): 127/85 Body Mass Index(BMI): 31 Temperature(F): 98.4 Respiratory Rate(breaths/min): 18 Photos: [38R:No Photos Right T - Web between 1st and 2nd Left Ischium oe] [41R:No Photos] [46:No Photos Right, Dorsal Ankle] Wound Location: [38R:Gradually Appeared] [41R:Gradually Appeared] [46:Gradually Appeared] Wounding Event: [38R:Inflammatory] [41R:Pressure Ulcer] [46:Abrasion] Primary Etiology: [38R:Sleep Apnea, Hypertension, Paraplegia Sleep Apnea, Hypertension, Paraplegia Sleep Apnea, Hypertension, Paraplegia] Comorbid History: [38R:11/30/2019] [41R:03/16/2020] [46:08/13/2020] Date Acquired: [38R:41] [41R:26] [  46:4] Weeks of Treatment: [38R:Open] [41R:Open] [46:Open] Wound Status: [38R:Yes] [41R:Yes] [46:No] Wound Recurrence: [38R:No] [41R:Yes] [46:No] Clustered Wound: [38R:[name]] [41R:3] [46:[name]] Clustered Quantity: [38R:0.3x0.5x0.1] [41R:1.4x3.1x0.1] [46:0.8x0.8x0.1] Measurements L x W x D (cm) [38R:0.118] [41R:3.409] [46:0.503] [name] (cm) : rea [38R:0.012] [41R:0.341] [46:0.05] Volume (cm) : [38R:64.20%] [41R:-88.80%] [46:-698.40%] % Reduction in [name] rea: [38R:94.80%] [41R:-88.40%] [46:-733.30%] % Reduction in Volume: [38R:Full Thickness Without Exposed] [41R:Category/Stage II] [46:Full Thickness Without Exposed] Classification: [38R:Support Structures Small] [41R:Medium] [46:Support Structures Small] Exudate Amount: [38R:Serous] [41R:Serosanguineous] [46:Serosanguineous] Exudate Type: [38R:amber] [41R:red, brown] [46:red, brown] Exudate Color: [38R:Flat and Intact] [41R:Distinct, outline attached] [46:Flat and Intact] Wound Margin: [38R:Large (67-100%)] [41R:Large (67-100%)] [46:Large (67-100%)] Granulation Amount: [38R:Pink] [41R:Red, Friable] [46:Red] Granulation Quality: [38R:None Present (0%)] [41R:None Present (0%)] [46:None Present (0%)] Necrotic Amount: [38R:Fat Layer (Subcutaneous Tissue): Yes Fat Layer (Subcutaneous Tissue): Yes  Fat Layer (Subcutaneous Tissue): Yes] Exposed Structures: [38R:Fascia: No Tendon: No Muscle: No Joint: No Bone: No Large (67-100%)] [41R:Fascia: No Tendon: No Muscle: No Joint: No Bone: No Medium (34-66%)] [46:Fascia: No Tendon: No Muscle: No Joint: No Bone: No Medium (34-66%)] Treatment Notes Electronic Signature(s) Signed: 09/15/2020 5:02:41 PM By: Levan Hurst RN, BSN Signed: 09/15/2020 5:10:17 PM By: Linton Ham MD Entered By: Linton Ham on 09/15/2020 08:25:15 -------------------------------------------------------------------------------- Multi-Disciplinary Care Plan Details Patient Name: Date of Service: [place], [name] [name] 09/15/2020 7:30 [name] M Medical Record Number: [id number] Patient Account Number: [id number] Date of Birth/Sex: Treating RN: [birth year] (34 y.o. [name] Primary Care Yina Riviere: [name], [name] Other Clinician: Referring Laster Appling: Treating Dejae Bernet/Extender: [name]: 245 Active Inactive Wound/Skin Impairment Nursing Diagnoses: Impaired tissue integrity Knowledge deficit related to ulceration/compromised skin integrity Goals: Patient/caregiver will verbalize understanding of skin care regimen Date Initiated: 01/05/2016 Target Resolution Date: 10/03/2020 Goal Status: Active Ulcer/skin breakdown will have [name] volume reduction of 30% by week 4 Date Initiated: 01/05/2016 Date Inactivated: 12/22/2017 Target Resolution Date: 01/19/2018 Unmet Reason: complex wounds, Goal Status: Unmet infection Interventions: Assess patient/caregiver ability to obtain necessary supplies Assess ulceration(s) every visit Provide education on ulcer and skin care Notes: Electronic Signature(s) Signed: 09/15/2020 5:02:41 PM By: Levan Hurst RN, BSN Entered By: Levan Hurst on 09/15/2020 07:51:59 -------------------------------------------------------------------------------- Pain Assessment Details Patient Name: Date of  Service: [name], [name] [name] 09/15/2020 7:30 [name] M Medical Record Number: [id number] Patient Account Number: [id number] Date of Birth/Sex: Treating RN: [birth year] (34 y.o. [name] Primary Care Kammi Hechler: [place], [place] Other Clinician: Referring Avangeline Stockburger: Treating Abass Misener/Extender: [name]: 245 Active Problems Location of Pain Severity and Description of Pain Patient Has Paino No Site Locations Rate the pain. Rate the pain. Current Pain Level: 0 Pain Management and Medication Current Pain Management: Electronic Signature(s) Signed: 09/15/2020 5:06:51 PM By: Baruch Gouty RN, BSN Entered By: Baruch Gouty on 09/15/2020 07:58:37 -------------------------------------------------------------------------------- Patient/Caregiver Education Details Patient Name: Date of Service: Mcmillion, [name] 10/25/2021andnbsp7:30 [name] M Medical Record Number: [id number] Patient Account Number: [id number] Date of Birth/Gender: Treating RN: October 03, 1986 (34 y.o. [name] Primary Care Physician: Janine Limbo Other Clinician: Referring Physician: Treating Physician/Extender: [name]: [phone] Education Assessment Education Provided To: Patient Education Topics Provided Wound/Skin Impairment: Methods: Explain/Verbal Responses: State content correctly Electronic Signature(s) Signed: 09/15/2020 5:02:41 PM By: Levan Hurst RN, BSN Entered By: Levan Hurst on 09/15/2020 07:54:00 -------------------------------------------------------------------------------- Wound Assessment Details Patient Name: Date of Service: [name], [name] [name] 09/15/2020 7:30 [name] M Medical Record Number: [id number] Patient Account Number: [id number] Date of Birth/Sex: Treating RN: 1985-12-16 (34 y.o.  [name] Primary Care Maron Stanzione: [name], [name] Other Clinician: Referring Yahye Siebert: Treating Lateefa Crosby/Extender: [name]: 245 Wound Status Wound Number: [id number] Primary Etiology: Inflammatory Wound Location: Right T - Web between 1st and 2nd oe Wound Status: Open Wounding Event: Gradually Appeared Comorbid History: Sleep Apnea, Hypertension, Paraplegia Date Acquired: 11/30/2019 Weeks Of Treatment: 41 Clustered Wound: No Wound Measurements Length: (cm) 0.3 Width: (cm) 0.5 Depth: (cm) 0.1 Area: (cm) 0.118 Volume: (cm) 0.012 % Reduction in Area: 64.2% % Reduction in Volume: 94.8% Epithelialization: Large (67-100%) Tunneling: No Undermining: No Wound Description Classification: Full Thickness Without Exposed Support Structures Wound Margin: Flat and Intact Exudate Amount: Small Exudate Type: Serous Exudate Color: amber Foul Odor After Cleansing: No Slough/Fibrino No Wound Bed Granulation Amount: Large (67-100%) Exposed Structure Granulation Quality: Pink Fascia Exposed: No Necrotic Amount: None Present (0%) Fat Layer (Subcutaneous Tissue) Exposed: Yes Tendon Exposed: No Muscle Exposed: No Joint Exposed: No Bone Exposed: No Treatment Notes Wound #38R (Right Toe - Web between 1st and 2nd) 1. Cleanse With Wound Cleanser 3. Primary Dressing Applied Calcium Alginate Ag 4. Secondary Dressing Dry Gauze Roll Gauze 5. Secured With [employer] tape Notes Development worker, international aid) Signed: 09/15/2020 5:06:51 PM By: Baruch Gouty RN, BSN Entered By: Baruch Gouty on 09/15/2020 08:01:23 -------------------------------------------------------------------------------- Wound Assessment Details Patient Name: Date of Service: [name], [name] [name] 09/15/2020 7:30 [name] M Medical Record Number: [id number] Patient Account Number: [id number] Date of Birth/Sex: Treating RN: 03-18-1986 (34 y.o. [name] Primary Care Aimie Wagman: [name], [name] Other Clinician: Referring Chadd Tollison: Treating Henrry Feil/Extender: [name] in  Treatment: 245 Wound Status Wound Number: [id number] Primary Etiology: Pressure Ulcer Wound Location: Left Ischium Wound Status: Open Wounding Event: Gradually Appeared Comorbid History: Sleep Apnea, Hypertension, Paraplegia Date Acquired: 03/16/2020 Weeks Of Treatment: 26 Clustered Wound: Yes Wound Measurements Length: (cm) 1.4 Width: (cm) 3.1 Depth: (cm) 0.1 Clustered Quantity: 3 Area: (cm) 3.409 Volume: (cm) 0.341 % Reduction in Area: -88.8% % Reduction in Volume: -88.4% Epithelialization: Medium (34-66%) Tunneling: No Undermining: No Wound Description Classification: Category/Stage II Wound Margin: Distinct, outline attached Exudate Amount: Medium Exudate Type: Serosanguineous Exudate Color: red, brown Foul Odor After Cleansing: No Slough/Fibrino No Wound Bed Granulation Amount: Large (67-100%) Exposed Structure Granulation Quality: Red, Friable Fascia Exposed: No Necrotic Amount: None Present (0%) Fat Layer (Subcutaneous Tissue) Exposed: Yes Tendon Exposed: No Muscle Exposed: No Joint Exposed: No Bone Exposed: No Treatment Notes Wound #41R (Left Ischium) 1. Cleanse With Wound Cleanser 3. Primary Dressing Applied Calcium Alginate Ag 4. Secondary Dressing Foam Border Dressing 5. Secured With Office manager) Signed: 09/15/2020 5:06:51 PM By: Baruch Gouty RN, BSN Entered By: Baruch Gouty on 09/15/2020 08:01:56 -------------------------------------------------------------------------------- Wound Assessment Details Patient Name: Date of Service: [name], [name] [name] 09/15/2020 7:30 [name] M Medical Record Number: [id number] Patient Account Number: [id number] Date of Birth/Sex: Treating RN: January 12, 1986 (34 y.o. [name] Primary Care Jaqwan Wieber: [place], [place] Other Clinician: Referring Pema Thomure: Treating Jahara Dail/Extender: [name]: 245 Wound Status Wound Number: [id number] Primary Etiology:  Abrasion Wound Location: Right, Dorsal Ankle Wound Status: Open Wounding Event: Gradually Appeared Comorbid History: Sleep Apnea, Hypertension, Paraplegia Date Acquired: 08/13/2020 Weeks Of Treatment: 4 Clustered Wound: No Wound Measurements Length: (cm) 0.8 Width: (cm) 0.8 Depth: (cm) 0.1 Area: (cm) 0.503 Volume: (cm) 0.05 % Reduction in Area: -698.4% % Reduction in Volume: -733.3% Epithelialization: Medium (34-66%) Tunneling: No Undermining: No Wound Description Classification: Full Thickness Without Exposed Support Structures Wound  Margin: Flat and Intact Exudate Amount: Small Exudate Type: Serosanguineous Exudate Color: red, brown Foul Odor After Cleansing: No Slough/Fibrino Yes Wound Bed Granulation Amount: Large (67-100%) Exposed Structure Granulation Quality: Red Fascia Exposed: No Necrotic Amount: None Present (0%) Fat Layer (Subcutaneous Tissue) Exposed: Yes Tendon Exposed: No Muscle Exposed: No Joint Exposed: No Bone Exposed: No Treatment Notes Wound #46 (Right, Dorsal Ankle) 1. Cleanse With Wound Cleanser 3. Primary Dressing Applied Hydrofera Blue 4. Secondary Dressing Foam Border Dressing 5. Secured With Office manager) Signed: 09/15/2020 5:06:51 PM By: Baruch Gouty RN, BSN Entered By: Baruch Gouty on 09/15/2020 08:02:31 -------------------------------------------------------------------------------- Vitals Details Patient Name: Date of Service: [name], [name] [name] 09/15/2020 7:30 [name] M Medical Record Number: [id number] Patient Account Number: [id number] Date of Birth/Sex: Treating RN: May 28, 1986 (34 y.o. [name] Primary Care Irving Bloor: [place], [place] Other Clinician: Referring Elic Vencill: Treating Doria Fern/Extender: [name]: 245 Vital Signs Time Taken: 07:58 Temperature (F): 98.4 Height (in): 70 Pulse (bpm): 120 Source: Stated Respiratory Rate  (breaths/min): 18 Weight (lbs): 216 Blood Pressure (mmHg): 127/85 Source: Stated Reference Range: 80 - 120 mg / dl Body Mass Index (BMI): 31 Electronic Signature(s) Signed: 09/15/2020 5:06:51 PM By: Baruch Gouty RN, BSN Entered By: Baruch Gouty on 09/15/2020 07:58:27

## 2020-09-25 DIAGNOSIS — I10 Essential (primary) hypertension: Secondary | ICD-10-CM | POA: Diagnosis not present

## 2020-09-25 DIAGNOSIS — R635 Abnormal weight gain: Secondary | ICD-10-CM | POA: Diagnosis not present

## 2020-09-25 DIAGNOSIS — R350 Frequency of micturition: Secondary | ICD-10-CM | POA: Diagnosis not present

## 2020-09-25 DIAGNOSIS — M62838 Other muscle spasm: Secondary | ICD-10-CM | POA: Diagnosis not present

## 2020-09-29 ENCOUNTER — Encounter (HOSPITAL_BASED_OUTPATIENT_CLINIC_OR_DEPARTMENT_OTHER): Payer: BC Managed Care – PPO | Attending: Internal Medicine | Admitting: Internal Medicine

## 2020-09-29 ENCOUNTER — Other Ambulatory Visit: Payer: Self-pay

## 2020-09-29 DIAGNOSIS — G8221 Paraplegia, complete: Secondary | ICD-10-CM | POA: Insufficient documentation

## 2020-09-29 DIAGNOSIS — L97521 Non-pressure chronic ulcer of other part of left foot limited to breakdown of skin: Secondary | ICD-10-CM | POA: Diagnosis not present

## 2020-09-29 DIAGNOSIS — L97511 Non-pressure chronic ulcer of other part of right foot limited to breakdown of skin: Secondary | ICD-10-CM | POA: Insufficient documentation

## 2020-09-29 DIAGNOSIS — L97311 Non-pressure chronic ulcer of right ankle limited to breakdown of skin: Secondary | ICD-10-CM | POA: Diagnosis not present

## 2020-09-29 DIAGNOSIS — L89323 Pressure ulcer of left buttock, stage 3: Secondary | ICD-10-CM | POA: Insufficient documentation

## 2020-09-29 DIAGNOSIS — I87332 Chronic venous hypertension (idiopathic) with ulcer and inflammation of left lower extremity: Secondary | ICD-10-CM | POA: Diagnosis not present

## 2020-09-29 DIAGNOSIS — L97312 Non-pressure chronic ulcer of right 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: Secondary | ICD-10-CM | POA: Diagnosis not present

## 2020-09-29 DIAGNOSIS — L97512 Non-pressure chronic ulcer of other part of right foot with fat layer exposed: Secondary | ICD-10-CM | POA: Diagnosis not present

## 2020-09-29 DIAGNOSIS — L97321 Non-pressure chronic ulcer of left ankle limited to breakdown of skin: Secondary | ICD-10-CM | POA: Diagnosis not present

## 2020-09-29 DIAGNOSIS — L89224 Pressure ulcer of left hip, stage 4: Secondary | ICD-10-CM | POA: Diagnosis not present

## 2020-09-30 NOTE — Progress Notes (Signed)
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 visit. He was also reduced to 2 layer compression I'm not exactly sure of the issue here. 07/21/17; better surface today after 1 week of Iodoflex. Significant cellulitis that we treated last week also better. [Doxycycline] 07/28/17 better surface today with now 2 [name] of Iodoflex.  Significant cellulitis treated with doxycycline. He has now completed the doxycycline and he is back to his usual degree of chronic venous inflammation/stasis dermatitis. He reminds me he has had ablations surgery here 08/04/17; continued improvement with Iodoflex to the left lateral leg wound in terms of the surface of the wound although the dimensions are better. 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 Problematic areas on the plantar aspect of the first second toe web space on the left and the dorsal aspect of the first second toe web space on the right. At one point I felt these were probably related to chronic fungal infections in treated him aggressively for this although we have not made any improvement here. 08/11/17; left lateral leg. Surface continues to improve with the Iodoflex although we are not seeing much improvement in overall wound dimensions. Areas on his plantar left foot and right foot show no improvement. In fact the right foot looks somewhat worse 08/18/17; left lateral leg. We changed to [facility] Blue last week after [name] prolonged course of Iodoflex which helps get the surface better. It appears that the wound with is improved. Continue with difficult areas on the left dorsal first second and plantar first second on the right  09/01/17; patient arrives in clinic today having had [name] temperature of 103 yesterday. He was seen in the ER and [facility]. The patient was concerned he could have cellulitis again in the right leg however they diagnosed him with [name] UTI and he is now on Keflex. He has [name] history of cellulitis which is been recurrent and difficult but this is been in the left leg, in the past 5 use doxycycline. He does in and out catheterizations at home which are risk factors for UTI 09/08/17; patient will be completing his Keflex this weekend. The erythema on the left leg is considerably better. He has [name] new  wound today on the medial part of the right leg small superficial almost looks like [name] skin tear. He has worsening of the area on the right dorsal first and second toe. His major area on the left lateral leg is better. Using Hydrofera Blue on all areas 09/15/17; gradual reduction in width on the long wound in the left lateral leg. No debridement required. He also has wounds on the plantar aspect of his left first second toe web space and on the dorsal aspect of the right first second toe web space. 09/22/17; there continues to be very gradual improvements in the dimensions of the left lateral leg wound. He hasn't round erythematous spot with might be pressure on his wheelchair. There is no evidence obviously of infection no purulence no warmth He has [name] dry scaled area on the plantar aspect of the left first second toe Improved area on the dorsal right first second toe. 09/29/17; left lateral leg wound continues to improve in dimensions mostly with an is still [name] fairly long but increasingly narrow wound. He has [name] dry scaled area on the plantar aspect of his left first second toe web space Increasingly concerning area on the dorsal right first second toe. In fact I am concerned today about possible cellulitis around this wound. The areas extending up his second toe and although there is deformities here almost appears to abut on the nailbed. 10/06/17; left lateral leg wound continues to make very gradual progress. Tissue culture I did from the right first second toe dorsal foot last time grew MRSA and enterococcus which was vancomycin sensitive. This was not sensitive to clindamycin or doxycycline. He is allergic to Zyvox and sulfa we have therefore arrange for him to have dalvance infusion tomorrow. He is had this in the past and tolerated it well 10/20/17; left lateral leg wound continues to make decent progress. This is certainly reduced in terms of with there is advancing epithelialization.The  cellulitis in the right foot looks better although he still has [name] deep wound in the dorsal aspect of the first second toe web space. Plantar left first toe web space on the left I think is making some progress 10/27/17; left lateral leg wound continues to make decent progress. Advancing epithelialization.using Hydrofera Blue The right first second toe web space wound is better-looking using silver alginate Improvement in the left plantar first second toe web space. Again using silver alginate 11/03/17 left lateral leg wound continues to make decent progress albeit slowly. Using [facility] The right per second toe web space continues to be [name] very problematic looking punched out wound. I obtained [name] piece of tissue for deep culture I did extensively treated this for fungus. It is difficult to imagine that this is [name] pressure area as the patient states other than going outside he doesn't really wear shoes at home The  left plantar first second toe web space looked fairly senescent. Necrotic edges. This required debridement change to [facility] Blue to all wound areas 11/10/17; left lateral leg wound continues to contract. Using Hydrofera Blue On the right dorsal first second toe web space dorsally. Culture I did of this area last week grew MRSA there is not an easy oral option in this patient was multiple antibiotic allergies or intolerances. This was only [name] rare culture isolate I'm therefore going to use Bactroban under silver alginate On the left plantar first second toe web space. Debridement is required here. This is also unchanged 11/17/17; left lateral leg wound continues to contract using Hydrofera Blue this is no longer the major issue. The major concern here is the right first second toe web space. He now has an open area going from dorsally to the plantar aspect. There is now wound on the inner lateral part of the first toe. Not [name] very viable surface on this. There is erythema spreading  medially into the forefoot. No major change in the left first second toe plantar wound 11/24/17; left lateral leg wound continues to contract using Hydrofera Blue. Nice improvement today The right first second toe web space all of this looks [name] lot less angry than last week. I have given him clindamycin and topical Bactroban for MRSA and terbinafine for the possibility of underlining tinea pedis that I could not control with ketoconazole. Looks somewhat better The area on the plantar left first second toe web space is weeping with dried debris around the wound 12/01/17; left lateral leg wound continues to contract he Hydrofera Blue. It is becoming thinner in terms of with nevertheless it is making good improvement. The right first second toe web space looks less angry but still [name] large necrotic-looking wounds starting on the plantar aspect of the right foot extending between the toes and now extensively on the base of the right second toe. I gave him clindamycin and topical Bactroban for MRSA anterior benefiting for the possibility of underlying tinea pedis. Not looking better today The area on the left first/second toe looks better. Debrided of necrotic debris 12/05/17* the patient was worked in urgently today because over the weekend he found blood on his incontinence bad when he woke up. He was found to have an ulcer by his wife who does most of his wound care. He came in today for [place] to look at this. He has not had [name] history of wounds in his buttocks in spite of his paraplegia. 12/08/17; seen in follow-up today at his usual appointment. He was seen earlier this week and found to have [name] new wound on his buttock. We also follow him for wounds on the left lateral leg, left first second toe web space and right first second toe web space 12/15/17; we have been using Hydrofera Blue to the left lateral leg which has improved. The right first second toe web space has also improved. Left first second toe web  space plantar aspect looks stable. The left buttock has worsened using Santyl. Apparently the buttock has drainage 12/22/17; we have been using Hydrofera Blue to the left lateral leg which continues to improve now 2 small wounds separated by normal skin. He tells [place] he had [name] fever up to 100 yesterday he is prone to UTIs but has not noted anything different. He does in and out catheterizations. The area between the first and second toes today does not look good necrotic surface covered with what looks  to be purulent drainage and erythema extending into the third toe. I had gotten this to something that I thought look better last time however it is not look good today. He also has [name] necrotic surface over the buttock wound which is expanded. I thought there might be infection under here so I removed [name] lot of the surface with [name] #5 curet though nothing look like it really needed culturing. He is been using Santyl to this area 12/27/17; his original wound on the left lateral leg continues to improve using Hydrofera Blue. I gave him samples of Baxdella although he was unable to take them out of fear for an allergic reaction ["lump in his throat"].the culture I did of the purulent drainage from his second toe last week showed both enterococcus and [name] set Enterobacter I was also concerned about the erythema on the bottom of his foot although paradoxically although this looks somewhat better today. 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 We've been using silver alginate here. They came up today that he sleeps in [name] recliner, I'm not sure why but I asked him to stop this 01/03/18; his original wound we've been using Hydrofera Blue is now separated into 2 areas. Ulcer on his left buttock is better he is off the recliner and sleeping in bed Finally both wound areas between his first and second toes also looks some  better 01/10/18; his original wound on the left lateral leg is now separated into 2 wounds we've been using Hydrofera Blue Ulcer on his left buttock has some drainage. There is [name] small probing site going into muscle layer superiorly.using silver alginate -He arrives today with [name] deep tissue injury on the left heel The wound on the dorsal aspect of his first second toe on the left looks [name] lot betterusing silver alginate ketoconazole The area on the first second toe web space on the right also looks [name] lot bette 01/17/18; his original wound on the left lateral leg continues to progress using Hydrofera Blue Ulcer on his left buttock also is smaller surface healthier except for [name] small probing site going into the muscle layer superiorly. 2.4 cm of tunneling in this area DTI on his left heel we have only been offloading. Looks better than last week no threatened open no evidence of infection the wound on the dorsal aspect of the first second toe on the left continues to look like it's regressing we have only been using silver alginate and terbinafine orally The area in the first second toe web space on the right also looks to be [name] lot better using silver alginate and terbinafine I think this was prompted by tinea pedis 01/31/18; the patient was hospitalized in [place] last week apparently for [name] complicated UTI. He was discharged on cefepime he does in and out catheterizations. In the hospital he was discovered M I don't mild elevation of AST and ALT and the terbinafine was stopped.predictably the pressure ulcer on s his buttock looks betterusing silver alginate. The area on the left lateral leg also is better using Hydrofera Blue. The area between the first and second toes on the left better. First and second toes on the right still substantial but better. Finally the DTI on the left heel has held together and looks like it's resolving 02/07/18-he is here in follow-up evaluation for multiple ulcerations.  He has new injury to the lateral aspect of  the last issue [name] pressure ulcer, he states this is from adhesive removal trauma. He states he has tried multiple adhesive products with no success. All other ulcers appear stable. The left heel DTI is resolving. We will continue with same treatment plan and follow-up next week. 02/14/18; follow-up for multiple areas. He has [name] new area last week on the lateral aspect of his pressure ulcer more over the posterior trochanter. The original pressure ulcer looks quite stable has healthy granulation. 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 Almost fully epithelialized on the original superior area using Hydrofera Blue DTI on the left heel has peeled off this week to reveal [name] small superficial wound under denuded skin and subcutaneous tissue Both areas between the first and second toes look better including nothing open on the left 02/21/18; The patient's wounds on his left ischial tuberosity and posterior left greater trochanter actually looked better. He has [name] large area of irritation around the area which I think is contact dermatitis. I am doubtful that this is fungal His original wound on the left lateral calf continues to improve we have been using Hydrofera Blue There is no open area in the left first second toe web space although there is [name] lot of thick callus The DTI on the left heel required debridement today of necrotic surface eschar and subcutaneous tissue using silver alginate Finally the area on the right first second toe webspace continues to contract using silver alginate and ketoconazole 02/28/18 Left ischial tuberosity wounds look better using silver alginate. Original wound on the left calf only has one small open area left using Hydrofera Blue DTI on the left heel required debridement mostly removing skin from around this wound surface. Using  silver alginate The areas on the right first/second toe web space using silver alginate and ketoconazole 03/08/18 on evaluation today patient appears to be doing decently well as best I can tell in regard to his wounds. This is the first time that I have seen him as he generally is followed by Dr. Dellia Nims. With that being said none of his wounds appear to be infected he does have an area where there is some skin covering what appears to be [name] new wound on the left dorsal surface of his great toe. This is right at the nail bed. With that being said I do believe that debrided away some of the excess skin can be of benefit in this regard. Otherwise he has been tolerating the dressing changes without complication. 03/14/18; patient arrives today with the multiplicity of wounds that we are following. He has not been systemically unwell Original wound on the left lateral calf now only has 2 small open areas we've been using Hydrofera Blue which should continue The deep tissue injury on the left heel requires debridement today. We've been using silver alginate The left first second toe and the right first second toe are both are reminiscence what I think was tinea pedis. Apparently some of the callus Surface between the toes was removed last week when it started draining. Purulent drainage coming from the wound on the ischial tuberosity on the left. 03/21/18-He is here in follow-up evaluation for multiple wounds. 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 He tolerated debridement. The only change to last week's recommendations is to discontinue antifungal cream between toes. He will follow-up next week  03/28/18; following up for multiple wounds;Concern this week is streaking redness and swelling in the right foot. He is going to need antibiotics for this. 03/31/18; follow-up for right foot cellulitis. Streaking redness and swelling in the  right foot on 03/28/18. He has multiple antibiotic intolerances and [name] history of MRSA. I put him on clindamycin 300 mg every 6 and brought him in for [name] quick check. He has an open wound between his first and second toes on the right foot as [name] potential source. 04/04/18; Right foot cellulitis is resolving he is completing clindamycin. This is truly good news Left lateral calf wound which is initial wound only has one small open area inferiorly this is close to healing out. He has compression stockings. We will use Hydrofera Blue right down to the epithelialization of this Nonviable surface on the left heel which was initially pressure with [name] DTI. We've been using Hydrofera Blue. I'm going to switch this back to silver alginate Left first second toe/tinea pedis this looks better using silver alginate Right first second toe tinea pedis using silver alginate Large pressure ulcers on theLeft ischial tuberosity. Small wound here Looks better. I am uncertain about the surface over the large wound. Using silver alginate 04/11/18; Cellulitis in the right foot is resolved Left lateral calf wound which was his original wounds still has 2 tiny open areas remaining this is just about closed Nonviable surface on the left heel is better but still requires debridement Left first second toe/tinea pedis still open using silver alginate Right first second toe wound tinea pedis I asked him to go back to using ketoconazole and silver alginate Large pressure ulcers on the left ischial tuberosity this shear injury here is resolved. Wound is smaller. No evidence of infection using silver alginate 04/18/18; Patient arrives with an intense area of cellulitis in the right mid lower calf extending into the right heel area. Bright red and warm. Smaller area on the left anterior leg. He has [name] significant history of MRSA. He will definitely need antibioticsdoxycycline He now has 2 open areas on the left ischial tuberosity the  original large wound and now [name] satellite area which I think was above his initial satellite areas. Not [name] wonderful surface on this satellite area surrounding erythema which looks like pressure related. His left lateral calf wound again his original wound is just about closed Left heel pressure injury still requiring debridement Left first second toe looks [name] lot better using silver alginate Right first second toe also using silver alginate and ketoconazole cream also looks better 04/20/18; the patient was worked in early today out of concerns with his cellulitis on the right leg. I had started him on doxycycline. This was 2 days ago. His wife was concerned about the swelling in the area. Also concerned about the left buttock. He has not been systemically unwell no fever chills. No nausea vomiting or diarrhea 04/25/18; the patient's left buttock wound is continued to deteriorate he is using Hydrofera Blue. He is still completing clindamycin for the cellulitis on the right leg although all of this looks better. 05/02/18 Left buttock wound still with [name] lot of drainage and [name] very tightly adherent fibrinous necrotic surface. He has [name] deeper area superiorly The left lateral calf wound is still closed DTI wound on the left heel necrotic surface especially the circumference using Iodoflex Areas between his left first second toe and right first second toe both look better. Dorsally and the right first second toe he  had [name] necrotic surface although at smaller. In using silver alginate and ketoconazole. I did [name] culture last week which was [name] deep tissue culture of the reminiscence of the open wound on the right first second toe dorsally. This grew [name] few Acinetobacter and [name] few methicillin-resistant staph aureus. Nevertheless the area actually this week looked better. I didn't feel the need to specifically address this at least in terms of systemic antibiotics. 05/09/18; wounds are measuring larger more drainage per  our intake. We are using Santyl covered with alginate on the large superficial buttock wounds, Iodosorb on the left heel, ketoconazole and silver alginate to the dorsal first and second toes bilaterally. 05/16/18; The area on his left buttock better in some aspects although the area superiorly over the ischial tuberosity required an extensive debridement.using Santyl Left heel appears stable. Using Iodoflex The areas between his first and second toes are not bad however there is spreading erythema up the dorsal aspect of his left foot this looks like cellulitis again. He is insensate the erythema is really very brilliant.o Erysipelas He went to see an allergist days ago because he was itching part of this he had lab work done. This showed [name] white count of 15.1 with 70% neutrophils. Hemoglobin of 11.4 and [name] platelet count of 659,000. Last white count we had in Epic was [name] 2-1/2 years ago which was 25.9 but he was ill at the time. He was able to show me some lab work that was done by his primary physician the pattern is about the same. 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 But prompted me to go ahead and do x-rays of both feet and the pelvis rule out osteomyelitis. He also had [name] comprehensive metabolic panel this was reasonably normal his albumin was 3.7 liver function tests BUN/creatinine all normal 05/23/18; x-rays of both his feet from last week were negative for underlying pulmonary abnormality. The x-ray of his pelvis however showed mild irregularity in the left ischial which may represent some early osteomyelitis. The wound in the left ischial continues to get deeper clearly now exposed muscle. Each week necrotic surface material over this area. Whereas the rest of the wounds do not look so bad. The left ischial wound we have been using Santyl and calcium alginate T the left heel surface necrotic debris using Iodoflex o The left lateral leg is still  healed Areas on the left dorsal foot and the right dorsal foot are about the same. There is some inflammation on the left which might represent contact dermatitis, fungal dermatitis I am doubtful cellulitis although this looks better than last week 05/30/18; CT scan done at Hospital did not show any osteomyelitis or abscess. Suggested the possibility of underlying cellulitis although I don't see [name] lot of evidence of this at the bedside The wound itself on the left buttock/upper thigh actually looks somewhat better. No debridement Left heel also looks better no debridement continue Iodoflex Both dorsal first second toe spaces appear better using Lotrisone. Left still required debridement 06/06/18; Intake reported some purulent looking drainage from the left gluteal wound. Using Santyl and calcium alginate Left heel looks better although still [name] nonviable surface requiring debridement The left dorsal foot first/second webspace actually expanding and somewhat deeper. I may consider doing [name] shave biopsy of this area Right dorsal foot first/second webspace appears stable to improved. Using Lotrisone and silver alginate to both these areas 06/13/18 Left gluteal surface looks  better. Now separated in the 2 wounds. No debridement required. Still drainage. We'll continue silver alginate Left heel continues to look better with Iodoflex continue this for at least another week Of his dorsal foot wounds the area on the left still has some depth although it looks better than last week. We've been using Lotrisone and silver alginate 06/20/18 Left gluteal continues to look better healthy tissue Left heel continues to look better healthy granulation wound is smaller. He is using Iodoflex and his long as this continues continue the Iodoflex Dorsal right foot looks better unfortunately dorsal left foot does not. There is swelling and erythema of his forefoot. He had minor trauma to this several days ago but doesn't  think this was enough to have caused any tissue injury. Foot looks like cellulitis, we have had this problem before 06/27/18 on evaluation today patient appears to be doing [name] little worse in regard to his foot ulcer. 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 Both of which would really be his only options for treating this infection. In the past he has been given and effusion of Orbactiv. This is done very well for him in the past again it's one time dosing IV antibiotic therapy. Subsequently I do believe this is something we're gonna need to see about doing at this point in time. Currently his other wounds seem to be doing somewhat better in my pinion I'm pretty happy in that regard. 07/03/18 on evaluation today patient's wounds actually appear to be doing fairly well. He has been tolerating the dressing changes without complication. All in all he seems to be showing signs of improvement. In regard to the antibiotics he has been dealing with infectious disease since I saw him last week as far as getting this scheduled. In the end [facility]'s [facility] this coming Friday. In the meantime he has been continuing to perform the dressing changes in such as previous. There does not appear to be any evidence of infection worsengin at this time. 07/10/18; Since I last saw this man 2 [name] ago things have actually improved. IV antibiotics of resulted in less forefoot erythema although there is still some present. He is not systemically unwell Left buttock wounds 2 now have no depth there is increased epithelialization Using silver alginate Left heel still requires debridement using Iodoflex Left dorsal foot still with [name] sizable wound about the size of [name] border but healthy granulation Right dorsal foot still with [name]  slitlike area using silver alginate 07/18/18; the patient's cellulitis in the left foot is improved in fact I think it is on its way to resolving. Left buttock wounds 2 both look better although the larger one has hypertension granulation we've been using silver alginate Left heel has some thick circumferential redundant skin over the wound edge which will need to be removed today we've been using Iodoflex Left dorsal foot is still [name] sizable wound required debridement using silver alginate The right dorsal foot is just about closed only [name] small open area remains here 07/25/18; left foot cellulitis is resolved Left buttock wounds 2 both look better. Hyper-granulation on the major area Left heel as some debris over the surface but otherwise looks [name] healthier wound. Using silver collagen Right dorsal foot is just about closed 07/31/18; arrives with  our intake nurse worried about purulent drainage from the buttock. We had hyper-granulation here last week His buttock wounds 2 continue to look better Left heel some debris over the surface but measuring smaller. Right dorsal foot unfortunately has openings between the toes Left foot superficial wound looks less aggravated. 08/07/18 Buttock wounds continue to look better although some of her granulation and the larger medial wound. silver alginate Left heel continues to look [name] lot better.silver collagen Left foot superficial wound looks less stable. Requires debridement. He has [name] new wound superficial area on the foot on the lateral dorsal foot. Right foot looks better using silver alginate without Lotrisone 08/14/2018; patient was in the ER last week diagnosed with [name] UTI. He is now on Cefpodoxime and Macrodantin. Buttock wounds continued to be smaller. Using silver alginate Left heel continues to look better using silver collagen Left foot superficial wound looks as though it is improving Right dorsal foot area is just about healed. 08/21/2018; patient is  completed his antibiotics for his UTI. He has 2 open areas on the buttocks. There is still not closed although the surface looks satisfactory. Using silver alginate Left heel continues to improve using silver collagen The bilateral dorsal foot areas which are at the base of his first and second toes/possible tinea pedis are actually stable on the left but worse on the right. The area on the left required debridement of necrotic surface. After debridement I obtained [name] specimen for PCR culture. The right dorsal foot which is been just about healed last week is now reopened 08/28/2018; culture done on the left dorsal foot showed coag negative staph both staph epidermidis and Lugdunensis. I think this is worthwhile initiating systemic treatment. I will use doxycycline given his long list of allergies. The area on the left heel slightly improved but still requiring debridement. The large wound on the buttock is just about closed whereas the smaller one is larger. Using silver alginate in this area 09/04/2018; patient is completing his doxycycline for the left foot although this continues to be [name] very difficult wound area with very adherent necrotic debris. We are using silver alginate to all his wounds right foot left foot and the small wounds on his buttock, silver collagen on the left heel. 09/11/2018; once again this patient has intense erythema and swelling of the left forefoot. Lesser degrees of erythema in the right foot. He has [name] long list of allergies and intolerances. I will reinstitute doxycycline. 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 Using silver alginate Left heel also looks better using silver collagen Unfortunately both the areas on his feet look worse. The area on the left first second webspace is now gone through to the plantar part of his foot. The area on the left foot anteriorly is irritated with erythema and swelling in the  forefoot. 09/25/2018 His wound on the left plantar heel looks better. Using silver collagen The area on the left buttock 2 small remnant areas. One is closed one is still open. Using silver alginate The areas between both his first and second toes look worse. This in spite of long-standing antifungal therapy with ketoconazole and silver alginate which should have antifungal activity He has small areas around his original wound on the left calf one is on the bottom of the original scar tissue and one superiorly both of these are small and superficial but again given wound history in this site this is  worrisome 10/02/2018 Left plantar heel continues to gradually contract using silver collagen Left buttock wound is unchanged using silver alginate The areas on his dorsal feet between his first and second toes bilaterally look about the same. I prescribed clindamycin ointment to see if we can address chronic staph colonization and also the underlying possibility of erythrasma The left lateral lower extremity wound is actually on the lateral part of his ankle. Small open area here. We have been using silver alginate 10/09/2018; Left plantar heel continues to look healthy and contract. No debridement is required Left buttock slightly smaller with [name] tape injury wound just below which was new this week Dorsal feet somewhat improved I have been using clindamycin Left lateral looks lower extremity the actual open area looks worse although [name] lot of this is epithelialized. I am going to change to silver collagen today He has [name] lot more swelling in the right leg although this is not pitting not red and not particularly warm there is [name] lot of spasm in the right leg usually indicative of people with paralysis of some underlying discomfort. We have reviewed his vascular status from 2017 he had [name] left greater saphenous vein ablation. I wonder about referring him back to vascular surgery if the area on the left  leg continues to deteriorate. 10/16/2018 in today for follow-up and management of multiple lower extremity ulcers. His left Buttock wound is much lower smaller and almost closed completely. The wound to the left ankle has began to reopen with Epithelialization and some adherent slough. He has multiple new areas to the left foot and leg. The left dorsal foot without much improvement. Wound present between left great webspace and 2nd toe. Erythema and edema present right leg. Right LE ultrasound obtained on 10/10/18 was negative for DVT . 10/23/2018; Left buttock is closed over. Still dry macerated skin but there is no open wound. I suspect this is chronic pressure/moisture Left lateral calf is quite [name] bit worse than when I saw this last. There is clearly drainage here he has macerated skin into the left plantar heel. We will change the primary dressing to alginate Left dorsal foot has some improvement in overall wound area. Still using clindamycin and silver alginate Right dorsal foot about the same as the left using clindamycin and silver alginate The erythema in the right leg has resolved. He is DVT rule out was negative Left heel pressure area required debridement although the wound is smaller and the surface is health 10/26/2018 The patient came back in for his nurse check today predominantly because of the drainage coming out of the left lateral leg with [name] recent reopening of his original wound on the left lateral calf. He comes in today with [name] large amount of surrounding erythema around the wound extending from the calf into the ankle and even in the area on the dorsal foot. He is not systemically unwell. He is not febrile. Nevertheless this looks like cellulitis. We have been using silver alginate to the area. I changed him to [name] regular visit and I am going to prescribe him doxycycline. The rationale here is [name] long list of medication intolerances and [name] history of MRSA. I did not see anything  that I thought would provide [name] valuable culture 10/30/2018 Follow-up from his appointment 4 days ago with really an extensive area of cellulitis in the left calf left lateral ankle and left dorsal foot. I put him on doxycycline. He has [name] long list of medication allergies  which are true allergy reactions. 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 In any case he is [name] lot better today. The erythema especially in the anterior and lateral left calf is better. He still has left ankle erythema. He also is complaining about increasing edema in the right leg we have only been using Kerlix Coban and he has been doing the wraps at home. Finally he has [name] spotty rash on the medial part of his upper left calf which looks like folliculitis or perhaps wrap occlusion type injury. Small superficial macules not pustules 11/06/18 patient arrives today with again [name] considerable degree of erythema around the wound on the left lateral calf extending into the dorsal ankle and dorsal foot. This is [name] lot worse than when I saw this last week. He is on doxycycline really with not [name] lot of improvement. He has not been systemically unwell Wounds on the; left heel actually looks improved. Original area on the left foot and proximity to the first and second toes looks about the same. He has superficial areas on the dorsal foot, anterior calf and then the reopening of his original wound on the left lateral calf which looks about the same The only area he has on the right is the dorsal webspace first and second which is smaller. He has [name] large area of dry erythematous skin on the left buttock small open area here. 11/13/2018; the patient arrives in much better condition. The erythema around the wound on the left lateral calf is [name] lot better. 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 In any case this is [name] lot better. The area on the left heel is very small and just about resolved using silver collagen we have been using silver alginate to the areas on his dorsal feet 11/20/2018; his wounds include the left lateral calf, left heel, dorsal aspects of both feet just proximal to the first second webspace. He is stable to slightly improved. I did not think any changes to his dressings were going to be necessary 11/27/2018 he has [name] reopening on the left buttock which is surrounded by what looks like tinea or perhaps some other form of dermatitis. The area on the left dorsal foot has some erythema around it I have marked this area but I am not sure whether this is cellulitis or not. Left heel is not closed. Left calf the reopening is really slightly longer and probably worse 1/13; in general things look better and smaller except for the left dorsal foot. Area on the left heel is just about closed, left buttock looks better only [name] small wound remains in the skin looks better [using Lotrisone] 1/20; the area on the left heel only has [name] few remaining open areas here. Left lateral calf about the same in terms of size, left dorsal foot slightly larger right lateral foot still not closed. The area on the left buttock has no open wound and the surrounding skin looks [name] lot better 1/27; the area on the left heel is closed. Left lateral calf better but still requiring extensive debridements. The area on his left buttock is closed. He still has the open areas on the left dorsal foot which is slightly smaller in the right foot which is slightly expanded. We have been using Iodoflex on these areas as well 2/3; left heel is  closed. Left lateral calf still requiring debridement using Iodoflex there is no open area on his left buttock however he has dry scaly skin over [name] large area of this. Not really responding well to the Lotrisone. Finally the areas on his dorsal feet at the level of the  first second webspace are slightly smaller on the right and about the same on the left. Both of these vigorously debrided with Anasept and gauze 2/10; left heel remains closed he has dry erythematous skin over the left buttock but there is no open wound here. Left lateral leg has come in and with. Still requiring debridement we have been using Iodoflex here. Finally the area on the left dorsal foot and right dorsal foot are really about the same extremely dry callused fissured areas. He does not yet have [name] dermatology appointment 2/17; left heel remains closed. He has [name] new open area on the left buttock. The area on the left lateral calf is bigger longer and still covered in necrotic debris. No major change in his foot areas bilaterally. I am awaiting for [name] dermatologist to look on this. We have been using ketoconazole I do not know that this is been doing any good at all. 2/24; left heel remains closed. The left buttock wound that was new reopening last week looks better. The left lateral calf appears better also although still requires debridement. The major area on his foot is the left first second also requiring debridement. We have been putting Prisma on all wounds. I do not believe that the ketoconazole has done too much good for his feet. He will use Lotrisone I am going to give him [name] 2-week course of terbinafine. We still do not have [name] dermatology appointment 3/2 left heel remains closed however there is skin over bone in this area I pointed this out to him today. The left buttock wound is epithelialized but still does not look completely stable. The area on the left leg required debridement were using silver collagen here. With regards to his feet we changed to Lotrisone last week and silver alginate. 3/9; left heel remains closed. Left buttock remains closed. The area on the right foot is essentially closed. The left foot remains unchanged. Slightly smaller on the left lateral calf. Using  silver collagen to both of these areas 3/16-Left heel remains closed. Area on right foot is closed. Left lateral calf above the lateral malleolus open wound requiring debridement with easy bleeding. Left dorsal wound proximal to first toe also debrided. Left ischial area open new. Patient has been using Prisma with wrapping every 3 days. Dermatology appointment is apparently tomorrow.Patient has completed his terbinafine 2-week course with some apparent improvement according to him, there is still flaking and dry skin in his foot on the left 3/23; area on the right foot is reopened. The area on the left anterior foot is about the same still [name] very necrotic adherent surface. He still has the area on the left leg and reopening is on the left buttock. He apparently saw dermatology although I do not have [name] note. According to the patient who is usually fairly well informed they did not have any good ideas. Put him on oral terbinafine which she is been on before. 3/30; using silver collagen to all wounds. Apparently his dermatologist put him on doxycycline and rifampin presumably some culture grew staph. I do not have this result. He remains on terbinafine although I have used terbinafine on him before 4/6; patient has had  [name] fairly substantial reopening on the right foot between the first and second toes. He is finished his terbinafine and I believe is on doxycycline and rifampin still as prescribed by dermatology. We have been using silver collagen to all his wounds although the patient reports that he thinks silver alginate does better on the wounds on his buttock. 4/13; the area on his left lateral calf about the same size but it did not require debridement. Left dorsal foot just proximal to the webspace between the first and second toes is about the same. Still nonviable surface. I note some superficial bronze discoloration of the dorsal part of his foot Right dorsal foot just proximal to the first  and second toes also looks about the same. I still think there may be the same discoloration I noted above on the left Left buttock wound looks about the same 4/20; left lateral calf appears to be gradually contracting using silver collagen. 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 The left dorsal foot wound is debrided of tightly adherent necrotic debris and really cleans up quite nicely. The right area is worse with expansion. I did not debride this it is now over the base of the second toe The area on his left buttock is smaller no debridement is required using silver collagen 5/4; left calf continues to make good progress. He arrives with erythema around the wounds on his dorsal foot which even extends to the plantar aspect. Very concerning for coexistent infection. He is finished the erythromycin I gave him for possible erythrasma this does not seem to have helped. The area on the left foot is about the same base of the dorsal toes Is area on the buttock looks improved on the left 5/11; left calf and left buttock continued to make good progress. Left foot is about the same to slightly improved. Major problem is on the right foot. He has not had an x-ray. Deep tissue culture I did last week showed both Enterobacter and E. coli. I did not change the doxycycline I put him on empirically although neither 1 of these were plated to doxycycline. He arrives today with the erythema looking worse on both the dorsal and plantar foot. Macerated skin on the bottom of the foot. he has not been systemically unwell 5/18-Patient returns at 1 week, left calf wound appears to be making some progress, left buttock wound appears slightly worse than last time, left foot wound looks slightly better, right foot redness is marginally better. X-ray of both feet show no air or evidence of osteomyelitis. Patient is finished his Omnicef and terbinafine. He  continues to have macerated skin on the bottom of the left foot as well as right 5/26; left calf wound is better, left buttock wound appears to have multiple small superficial open areas with surrounding macerated skin. X-rays that I did last time showed no evidence of osteomyelitis in either foot. He is finished cefdinir and doxycycline. I do not think that he was on terbinafine. He continues to have [name] large superficial open area on the right foot anterior dorsal and slightly between the first and second toes. I did send him to dermatology 2 months ago or so wondering about whether they would do [name] fungal scraping. I do not believe they did but did do [name] culture. We have been using silver alginate to the toe areas, he has been using antifungals at home topically either ketoconazole or Lotrisone. We are using silver  collagen on the left foot, silver alginate on the right, silver collagen on the left lateral leg and silver alginate on the left buttock 6/1; left buttock area is healed. We have the left dorsal foot, left lateral leg and right dorsal foot. We are using silver alginate to the areas on both feet and silver collagen to the area on his left lateral calf 6/8; the left buttock apparently reopened late last week. He is not really sure how this happened. He is tolerating the terbinafine. Using silver alginate to all wounds 6/15; left buttock wound is larger than last week but still superficial. Came in the clinic today with [name] report of purulence from the left lateral leg I did not identify any infection Both areas on his dorsal feet appear to be better. He is tolerating the terbinafine. Using silver alginate to all wounds 6/22; left buttock is about the same this week, left calf quite [name] bit better. His left foot is about the same however he comes in with erythema and warmth in the right forefoot once again. Culture that I gave him in the beginning of May showed Enterobacter and E. coli. I gave him  doxycycline and things seem to improve although neither 1 of these organisms was specifically plated. 6/29; left buttock is larger and dry this week. Left lateral calf looks to me to be improved. Left dorsal foot also somewhat improved right foot completely unchanged. The erythema on the right foot is still present. He is completing the Ceftin dinner that I gave him empirically [see discussion above.) 7/6 - All wounds look to be stable and perhaps improved, the left buttock wound is slightly smaller, per patient bleeds easily, completed ceftin, the right foot redness is less, he is on terbinafine 7/13; left buttock wound about the same perhaps slightly narrower. Area on the left lateral leg continues to narrow. Left dorsal foot slightly smaller right foot about the same. We are using silver alginate on the right foot and Hydrofera Blue to the areas on the left. Unna boot on the left 2 layer compression on the right 7/20; left buttock wound absolutely the same. Area on lateral leg continues to get better. Left dorsal foot require debridement as did the right no major change in the 7/27; left buttock wound the same size necrotic debris over the surface. The area on the lateral leg is closed once again. His left foot looks better right foot about the same although there is some involvement now of the posterior first second toe area. He is still on terbinafine which I have given him for [name] month, not certain [name] centimeter major change 06/25/19-All wounds appear to be slightly improved according to report, left buttock wound looks clean, both foot wounds have minimal to no debris the right dorsal foot has minimal slough. We are using Hydrofera Blue to the left and silver alginate to the right foot and ischial wound. 8/10-Wounds all appear to be around the same, the right forefoot distal part has some redness which was not there before, however the wound looks clean and small. Ischial wound looks about the  same with no changes 8/17; his wound on the left lateral calf which was his original chronic venous insufficiency wound remains closed. Since I last saw him the areas on the left dorsal foot right dorsal foot generally appear better but require debridement. The area on his left initial tuberosity appears somewhat larger to me perhaps hyper granulated and bleeds very easily. We have been  using Hydrofera Blue to the left dorsal foot and silver alginate to everything else 8/24; left lateral calf remains closed. The areas on his dorsal feet on the webspace of the first and second toes bilaterally both look better. The area on the left buttock which is the pressure ulcer stage II slightly smaller. I change the dressing to Hydrofera Blue to all areas 8/31; left lateral calf remains closed. The area on his dorsal feet bilaterally look better. Using Hydrofera Blue. Still requiring debridement on the left foot. No change in the left buttock pressure ulcers however 9/14; left lateral calf remains closed. Dorsal feet look quite [name] bit better than 2 [name] ago. Flaking dry skin also [name] lot better with the ammonium lactate I gave him 2 [name] ago. The area on the left buttock is improved. He states that his Roho cushion developed [name] leak and he is getting [name] new one, in the interim he is offloading this vigorously 9/21; left calf remains closed. Left heel which was [name] possible DTI looks better this week. He had macerated tissue around the left dorsal foot right foot looks satisfactory and improved left buttock wound. I changed his dressings to his feet to silver alginate bilaterally. Continuing Hydrofera Blue on the left buttock. 9/28 left calf remains closed. Left heel did not develop anything [possible DTI] dry flaking skin on the left dorsal foot. Right foot looks satisfactory. Improved left buttock wound. We are using silver alginate on his feet Hydrofera Blue on the buttock. I have asked him to go back to the  Lotrisone on his feet including the wounds and surrounding areas 10/5; left calf remains closed. The areas on the left and right feet about the same. [name] lot of this is epithelialized however debris over the remaining open areas. He is using Lotrisone and silver alginate. The area on the left buttock using Hydrofera Blue 10/26. Patient has been out for 3 [name] secondary to Covid concerns. He tested negative but I think his wife tested positive. He comes in today with the left foot substantially worse, right foot about the same. Even more concerning he states that the area on his left buttock closed over but then reopened and is considerably deeper in one aspect than it was before [stage III wound] 11/2; left foot really about the same as last week. Quarter sized wound on the dorsal foot just proximal to the first second toes. Surrounding erythema with areas of denuded epithelium. This is not really much different looking. Did not look like cellulitis this time however. Right foot area about the same.. We have been using silver alginate alginate on his toes Left buttock still substantial irritated skin around the wound which I think looks somewhat better. We have been using Hydrofera Blue here. 11/9; left foot larger than last week and [name] very necrotic surface. Right foot I think is about the same perhaps slightly smaller. Debris around the circumference also addressed. Unfortunately on the left buttock there is been [name] decline. Satellite lesions below the major wound distally and now [name] an additional one posteriorly we have been using Hydrofera Blue but I think this is [name] pressure issue 11/16; left foot ulcer dorsally again [name] very adherent necrotic surface. Right foot is about the same. Not much change in the pressure ulcer on his left buttock. 11/30; left foot ulcer dorsally basically the same as when I saw him 2 [name] ago. Very adherent fibrinous debris on the wound surface. Patient reports [name] lot  of  drainage as well. 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 We have been using Iodoflex, patient changed back to alginate because of the drainage. Area on his right dorsal foot really looks benign with [name] healthier surface certainly [name] lot better than on the left. Left buttock wounds all improved using Hydrofera Blue 12/7; left dorsal foot again no improvement. Tightly adherent debris. PCR culture I did last week only showed likely skin contaminant. I have gone ahead and done [name] punch biopsy of this which is about the last thing in terms of investigations I can think to do. He has known venous insufficiency and venous hypertension and this could be the issue here. The area on the right foot is about the same left buttock slightly worse according to our intake nurse secondary to [facility] Blue sticking to the wound 12/14; biopsy of the left foot that I did last time showed changes that could be related to wound healing/chronic stasis dermatitis phenomenon no neoplasm. We have been using silver alginate to both feet. I change the one on the left today to Sorbact and silver alginate to his other 2 wounds 12/28; the patient arrives with the following problems; Major issue is the dorsal left foot which continues to be [name] larger deeper wound area. Still with [name] completely nonviable surface Paradoxically the area mirror image on the right on the right dorsal foot appears to be getting better. He had some loss of dry denuded skin from the lower part of his original wound on the left lateral calf. Some of this area looked [name] little vulnerable and for this reason we put him in wrap that on this side this week The area on his left buttock is larger. He still has the erythematous circular area which I think is [name] combination of pressure, sweat. This does not look like cellulitis or fungal dermatitis 11/26/2019; -Dorsal left foot large open wound with depth. Still  debris over the surface. Using Sorbact The area on the dorsal right foot paradoxically has closed over He has [name] reopening on the left ankle laterally at the base of his original wound that extended up into the calf. This appears clean. The left buttock wound is smaller but with very adherent necrotic debris over the surface. We have been using silver alginate here as well The patient had arterial studies done in 2017. He had biphasic waveforms at the dorsalis pedis and posterior tibial bilaterally. ABI in the left was 1.17. Digit waveforms were dampened. He has slight spasticity in the great toes I do not think [name] TBI would be possible 1/11; the patient comes in today with [name] sizable reopening between the first and second toes on the right. This is not exactly in the same location where we have been treating wounds previously. According to our intake nurse this was actually fairly deep but 0.6 cm. The area on the left dorsal foot looks about the same the surface is somewhat cleaner using Sorbact, his MRI is in 2 days. We have not managed yet to get arterial studies. The new reopening on the left lateral calf looks somewhat better using alginate. The left buttock wound is about the same using alginate 1/18; the patient had his ARTERIAL studies which were quite normal. ABI in the right at 1.13 with triphasic/biphasic waveforms on the left ABI 1.06 again with triphasic/biphasic waveforms. It would not have been possible to have done [name] toe brachial index because of spasticity. We have  been using Sorbac to the left foot alginate to the rest of his wounds on the right foot left lateral calf and left buttock 1/25; arrives in clinic with erythema and swelling of the left forefoot worse over the first MTP area. This extends laterally dorsally and but also posteriorly. Still has an area on the left lateral part of the lower part of his calf wound it is eschared and clearly not closed. Area on the left buttock  still with surrounding irritation and erythema. Right foot surface wound dorsally. The area between the right and first and second toes appears better. 2/1; The left foot wound is about the same. Erythema slightly better I gave him [name] week of doxycycline empirically Right foot wound is more extensive extending between the toes to the plantar surface Left lateral calf really no open surface on the inferior part of his original wound however the entire area still looks vulnerable Absolutely no improvement in the left buttock wound required debridement. 2/8; the left foot is about the same. Erythema is slightly improved I gave him clindamycin last week. Right foot looks better he is using Lotrimin and silver alginate He has [name] breakdown in the left lateral calf. Denuded epithelium which I have removed Left buttock about the same were using Hydrofera Blue 2/15; left foot is about the same there is less surrounding erythema. Surface still has tightly adherent debris which I have debriding however not making any progress Right foot has [name] substantial wound on the medial right second toe between the first and second webspace. Still an open area on the left lateral calf distal area. Buttock wound is about the same 2/22; left foot is about the same less surrounding erythema. Surface has adherent debris. Polymen Ag Right foot area significant wound between the first and second toes. We have been using silver alginate here Left lateral leg polymen Ag at the base of his original venous insufficiency wound Left buttock some improvement here 3/1; Right foot is deteriorating in the first second toe webspace. Larger and more substantial. We have been using silver alginate. Left dorsal foot about the same markedly adherent surface debris using PolyMem Ag Left lateral calf surface debris using PolyMem AG Left buttock is improved again using PolyMem Ag. He is completing his terbinafine. The erythema in the foot  seems better. He has been on this for 2 [name] 3/8; no improvement in any wound area in fact he has [name] small open area on the dorsal midfoot which is new this week. He has not gotten his foot x-rays yet 3/15; his x-rays were both negative for osteomyelitis of both feet. No major change in any of his wounds on the extremities however his buttock wounds are better. We have been using polymen on the buttocks, left lower leg. Iodoflex on the left foot and silver alginate on the right 3/22; arrives in clinic today with the 2 major issues are the improvement in the left dorsal foot wound which for once actually looks healthy with [name] nice healthy wound surface without debridement. Using Iodoflex here. Unfortunately on the left lateral calf which is in the distal part of his original wound he came to the clinic here for there was purulent drainage noted some increased breakdown scattered around the original area and [name] small area proximally. We we are using polymen here will change to silver alginate today. His buttock wound on the left is better and I think the area on the right first second toe webspace is  also improved 3/29; left dorsal foot looks better. Using Iodoflex. Left ankle culture from deterioration last time grew E. coli, Enterobacter and Enterococcus. I will give him [name] course of cefdinir although that will not cover Enterococcus. The area on the right foot in the webspace of the first and second toe lateral first toe looks better. The area on his buttock is about healed Vascular appointment is on April 21. This is to look at his venous system vis--vis continued breakdown of the wounds on the left including the left lateral leg and left dorsal foot he. He has had previous ablations on this side 4/5; the area between the right first and second toes lateral aspect of the first toe looks better. Dorsal aspect of the left first toe on the left foot also improved. Unfortunately the left lateral lower leg  is larger and there is [name] second satellite wound superiorly. The usual superficial abrasions on the left buttock overall better but certainly not closed 4/12; the area between the right first and second toes is improved. Dorsal aspect of the left foot also slightly smaller with [name] vibrant healthy looking surface. No real change in the left lateral leg and the left buttock wound is healed He has an unaffordable co-pay for Apligraf. Appointment with vein and vascular with regards to the left leg venous part of the circulation is on 4/21 4/19; we continue to see improvement in all wound areas. Although this is minor. He has his vascular appointment on 4/21. The area on the left buttock has not reopened although right in the center of this area the skin looks somewhat threatened 4/26; the left buttock is unfortunately reopened. In general his left dorsal foot has [name] healthy surface and looks somewhat smaller although it was not measured as such. The area between his first and second toe webspace on the right as [name] small wound against the first toe. The patient saw vascular surgery. The real question I was asking was about the small saphenous vein on the left. He has previously ablated left greater saphenous vein. Nothing further was commented on on the left. Right greater saphenous vein without reflux at the saphenofemoral junction or proximal thigh there was no indication for ablation of the right greater saphenous vein duplex was negative for DVT bilaterally. They did not think there was anything from [name] point of view that could be offered. They ABIs within normal limits 5/3; only small open area on the left buttock. The area on the left lateral leg which was his original venous reflux is now 2 wounds both which look clean. We are using Iodoflex on the left dorsal foot which looks healthy and smaller. He is down to [name] very tiny area between the right first and second toes, using  silver alginate 5/10; all of his wounds appear better. We have much better edema control in 4 layer compression on the left. This may be the factor that is allowing the left foot and left lateral calf to heal. He has external compression garments at home 04/14/20-All of his wounds are progressing well, the left forefoot is practically closed, left ischium appears to be about the same, right toe webspace is also smaller. The left lateral leg is about the same, continue using Hydrofera Blue to this, silver alginate to the ischium, Iodoflex to the toe space on the right 6/7; most of his wounds outside of the left buttock are doing well. The area on the left lateral calf and left  dorsal foot are smaller. The area on the right foot in between the first and second toe webspace is barely visible although he still says there is some drainage here is the only reason I did not heal this out. Unfortunately the area on the left buttock almost looks like he has [name] skin tear from tape. He has open wound and then [name] large flap of skin that we are trying to get adherence over an area just next to the remaining wound 6/21; 2 week follow-up. I believe is been here for nurse visits. Miraculously the area between his first and second toes on the left dorsal foot is closed over. Still open on the right first second web space. The left lateral calf has 2 open areas. Distally this is more superficial. The proximal area had [name] little more depth and required debridement of adherent necrotic material. His buttock wound is actually larger we have been using silver alginate here 6/28; the patient's area on the left foot remains closed. Still open wet area between the first and second toes on the right and also extending into the plantar aspect. We have been using silver alginate in this location. He has 2 areas on the left lower leg part of his original long wounds which I think are better. We have been using Hydrofera Blue here.  Hydrofera Blue to the left buttock which is stable 7/12; left foot remains closed. Left ankle is closed. May be [name] small area between his right first and second toes the only truly open area is on the left buttock. We have been using Hydrofera Blue here 7/19; patient arrives with marked deterioration especially in the left foot and ankle. We did not put him in [name] compression wrap on the left last week in fact he wore his juxta lite stockings on either side although he does not have an underlying stocking. He has [name] reopening on the left dorsal foot, left lateral ankle and [name] new area on the right dorsal ankle. More worrisome is the degree of erythema on the left foot extending on the lateral foot into the lateral lower leg on the left 7/26; the patient had erythema and drainage from the lateral left ankle last week. Culture of this grew MRSA resistant to doxycycline and clindamycin which are the 2 antibiotics we usually use with this patient who has multiple antibiotic allergies including linezolid, trimethoprim sulfamethoxazole. I had give him an empiric doxycycline and he comes in the area certainly looks somewhat better although it is blotchy in his lower leg. He has not been systemically unwell. He has had areas on the left dorsal foot which is [name] reopening, chronic wounds on the left lateral ankle. Both of these I think are secondary to chronic venous insufficiency. The area between his first and second toes is closed as far as I can tell. He had [name] new wrap injury on the right dorsal ankle last week. Finally he has an area on the left buttock. We have been using silver alginate to everything except the left buttock we are using Hydrofera Blue 06/30/20-Patient returns at 1 week, has been given [name] sample dose pack of NUZYRA which is [name] tetracycline derivative [omadacycline], patient has completed those, we have been using silver alginate to almost all the wounds except the left ischium where we are using  Hydrofera Blue all of them look better 8/16; since I last saw the patient he has been doing well. The area on the left buttock, left lateral  ankle and left foot are all closed today. He has completed the [place] I gave him last time and tolerated this well. He still has open areas on the right dorsal ankle and in the right first second toe area which we are using silver alginate. 8/23; we put him in his bilateral external compression stockings last week as he did not have anything open on either leg except for concerning area between the right first and second toe. He comes in today with an area on the left dorsal foot slightly more proximal than the original wound, the left lateral foot but this is actually [name] continuation of the area he had on the left lateral ankle from last time. As well he is opened up on the left buttock again. 8/30; comes in today with things looking [name] lot better. The area on the left lower ankle has closed down as has the left foot but with eschar in both areas. The area on the dorsal right ankle is also epithelialized. Very little remaining of the left buttock wound. We have been using silver alginate on all wound areas 9/13; the area in the first second toe webspace on the right has fully epithelialized. He still has some vulnerable epithelium on the right and the ankle and the dorsal foot. He notes weeping. He is using his juxta lite stocking. On the left again the left dorsal foot is closed left lateral ankle is closed. We went to the juxta lite stocking here as well. Still vulnerable in the left buttock although only 2 small open areas remain here 9/27; 2-week follow-up. We did not look at his left leg but the patient says everything is closed. He is [name] bit disturbed by the amount of edema in his left foot he is using juxta lite stockings but asking about over the toes stockings which would be 30/40, will talk to him next time. According to him there is no open wound on  either the left foot or the left ankle/calf He has an open area on the dorsal right calf which I initially point [name] wrap injury. He has superficial remaining wound on the left ischial tuberosity been using silver alginate although he says this sticks to the wound 10/5; we gave him 2-week follow-up but he called yesterday expressing some concerns about his right foot right ankle and the left buttock. He came in early. There is still no open areas on the left leg and that still in his juxta lite stocking 10/11; he only has 1 small area on the left buttock that remains measuring millimeters 1 mm. Still has the same irritated skin in this area. We recommended zinc oxide when this eventually closes and pressure relief is meticulously is he can do this. He still has an area on the dorsal part of his right first through third toes which is [name] bit irritated and still open and on the dorsal ankle near the crease of the ankle. We have been using silver alginate and using his own stocking. He has nothing open on the left leg or foot 10/25; 2-week follow-up. Not nearly as good on the left buttock as I was hoping. For open areas with 5 looking threatened small. He has the erythematous irritated chronic skin in this area. 1 area on the right dorsal ankle. He reports this area bleeds easily Right dorsal foot just proximal to the base of his toes We have been using silver alginate. 11/8; 2-week follow-up. Left buttock is about the same  although I do not think the wounds are in the same location we have been using silver alginate. I have asked him to use zinc oxide on the skin around the wounds. He still has [name] small area on the right dorsal ankle he reports this bleeds easily Right dorsal foot just proximal to the base of the toes does not have anything open although the skin is very dry and scaly He has [name] new opening on the nailbed of the left great toe. Nothing on the left ankle Electronic Signature(s) Signed:  09/30/2020 3:11:24 PM By: Linton Ham MD Entered By: Linton Ham on 09/29/2020 08:49:37 -------------------------------------------------------------------------------- Physical Exam Details Patient Name: Date of Service: [name] [name], [name] [name] 09/29/2020 7:30 [name] M Medical Record Number: [id number] Patient Account Number: [id number] Date of Birth/Sex: Treating RN: Jun 17, 1986 (34 y.o. [name] [name] [name] Primary Care Provider: Trooper, [facility] Other Clinician: Referring Provider: Treating Provider/Extender: [name] [name] [name] in Treatment: 657 Constitutional Sitting or standing Blood Pressure is within target range for patient.. Pulse regular and within target range for patient.Marland Kitchen Respirations regular, non-labored and within target range.. Temperature is normal and within the target range for the patient.Marland Kitchen Appears in no distress. Cardiovascular Needle pulses are palpable. Good edema control. Notes Wound exam; left buttock as open areas but they are not in the same location as previously. We have been using silver alginate. Area on the right ankle is just above closed. Been using Hydrofera Blue here. No debridement is required Dorsal right foot thick excoriated dry scaling skin yet I can see no open wounds He had dry skin on the left foot there is an open area in the base of the nailbed of the left great toe nothing is open on the left ankle Electronic Signature(s) Signed: 09/30/2020 3:11:24 PM By: Linton Ham MD Entered By: Linton Ham on 09/29/2020 08:50:59 -------------------------------------------------------------------------------- Physician Orders Details Patient Name: Date of Service: [name] [name], [name] [name] 09/29/2020 7:30 [name] M Medical Record Number: [id number] Patient Account Number: [id number] Date of Birth/Sex: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 looking wound on the left lower extremity. We have been using Iodoflex for [name] prolonged period of time which has for the first time prepared and adequate looking wound bed although we have not had much in the way of wound dimension improvement. He also  has [name] small wound between the first and second toe on the right 04/07/17; arrives today with [name] healthy-looking wound bed and at least the top 50% of this wound appears to be now her. No debridement was required I have changed him to [facility] last week after prolonged Iodoflex. He did not do well with Apligraf's. We've had [name] re-opening between the first and second toe on the right 04/14/17; arrives today with [name] healthier looking wound bed contractions and the top 50% of this wound and some on the lesser 50%. Wound bed appears healthy. The area between the first and second toe on the right still remains problematic 04/21/17; continued very gradual improvement. Using [facility] 04/28/17; continued very gradual improvement in the left lateral leg venous insufficiency wound. His periwound erythema is very mild. We have been using Hydrofera Blue. Wound is making progress especially in the superior 50% 05/05/17; he continues to have very gradual improvement in the left lateral venous insufficiency wound. Both in terms with an length rings are improving. I debrided this every 2 [name] with #5 curet and we have been using Hydrofera Blue and again making good progress With regards to the wounds between his right first and second toe which I thought might of been tinea pedis he is not making as much progress very dry scaly skin over the area. Also the area at the base of the left first and second toe in [name] similar condition 05/12/17; continued gradual improvement in the refractory left lateral venous insufficiency wound on the left. Dimension smaller. Surface still requiring debridement using Hydrofera Blue 05/19/17; continued gradual improvement in the refractory left lateral venous ulceration. Careful inspection of the wound bed underlying rumination suggested some degree of epithelialization over the surface no debridement indicated. Continue Hydrofera Blue difficult areas between his toes first and third  on the left than first and second on the right. I'm going to change to silver alginate from silver collagen. Continue ketoconazole as I suspect underlying tinea pedis 05/26/17; left lateral leg venous insufficiency wound. We've been using Hydrofera Blue. I believe that there is expanding epithelialization over the surface of the wound albeit not coming from the wound circumference. This is [name] bit of an odd situation in which the epithelialization seems to be coming from the surface of the wound rather than in the exact circumference. There is still small open areas mostly along the lateral margin of the wound. ooHe has unchanged areas between the left first and second and the right first second toes which I been treating for tenia pedis 06/02/17; left lateral leg venous insufficiency wound. We have been using Hydrofera Blue. Somewhat smaller from the wound circumference. The surface of the wound remains [name] bit on it almost epithelialized sedation in appearance. I use an open curette today debridement in the surface of all of this especially the edges ooSmall open wounds remaining on the dorsal right first and second toe interspace and the plantar left first second toe and her face on the left 06/09/17; wound on the left lateral leg continues to be smaller but very gradual and very dry surface using Hydrofera Blue 06/16/17 requires weekly debridements now  on the left lateral leg although this continues to contract. I changed to silver collagen last week because of dryness of the wound bed. Using Iodoflex to the areas on his first and second toes/web space bilaterally 06/24/17; patient with history of paraplegia also chronic venous insufficiency with lymphedema. Has [name] very difficult wound on the left lateral leg. This has been gradually reducing in terms of with but comes in with [name] very dry adherent surface. High switch to silver collagen [name] week or so ago with hydrogel to keep the area moist. This is been  refractory to multiple dressing attempts. He also has areas in his first and second toes bilaterally in the anterior and posterior web space. I had been using Iodoflex here after [name] prolonged course of silver alginate with ketoconazole was ineffective [question tinea pedis] 07/14/17; patient arrives today with [name] very difficult adherent material over his left lateral lower leg wound. He also has surrounding erythema and poorly controlled edema. He was switched his Santyl last visit which the nurses are applying once during his doctor visit and once on [name] nurse visit. He was also reduced to 2 layer compression I'm not exactly sure of the issue here. 07/21/17; better surface today after 1 week of Iodoflex. Significant cellulitis that we treated last week also better. [Doxycycline] 07/28/17 better surface today with now 2 [name] of Iodoflex. Significant cellulitis treated with doxycycline. He has now completed the doxycycline and he is back to his usual degree of chronic venous inflammation/stasis dermatitis. He reminds me he has had ablations surgery here 08/04/17; continued improvement with Iodoflex to the left lateral leg wound in terms of the surface of the wound although the dimensions are better. 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 Problematic areas on the plantar aspect of the first second toe web space on the left and the dorsal aspect of the first second toe web space on the right. At one point I felt these were probably related to chronic fungal infections in treated him aggressively for this although we have not made any improvement here. 08/11/17; left lateral leg. Surface continues to improve with the Iodoflex although we are not seeing much improvement in overall wound dimensions. Areas on his plantar left foot and right foot show no improvement. In fact the right foot looks somewhat worse 08/18/17; left lateral leg. We  changed to [facility] Blue last week after [name] prolonged course of Iodoflex which helps get the surface better. It appears that the wound with is improved. Continue with difficult areas on the left dorsal first second and plantar first second on the right 09/01/17; patient arrives in clinic today having had [name] temperature of 103 yesterday. He was seen in the ER and [facility]. The patient was concerned he could have cellulitis again in the right leg however they diagnosed him with [name] UTI and he is now on Keflex. He has [name] history of cellulitis which is been recurrent and difficult but this is been in the left leg, in the past 5 use doxycycline. He does in and out catheterizations at home which are risk factors for UTI 09/08/17; patient will be completing his Keflex this weekend. The erythema on the left leg is considerably better. He has [name] new wound today on the medial part of the right leg small superficial almost looks like [name] skin tear. He has worsening of the area on the right dorsal first and second toe. His major  area on the left lateral leg is better. Using Hydrofera Blue on all areas 09/15/17; gradual reduction in width on the long wound in the left lateral leg. No debridement required. He also has wounds on the plantar aspect of his left first second toe web space and on the dorsal aspect of the right first second toe web space. 09/22/17; there continues to be very gradual improvements in the dimensions of the left lateral leg wound. He hasn't round erythematous spot with might be pressure on his wheelchair. There is no evidence obviously of infection no purulence no warmth ooHe has [name] dry scaled area on the plantar aspect of the left first second toe ooImproved area on the dorsal right first second toe. 09/29/17; left lateral leg wound continues to improve in dimensions mostly with an is still [name] fairly long but increasingly narrow wound. ooHe has [name] dry scaled area on the plantar aspect of his left  first second toe web space ooIncreasingly concerning area on the dorsal right first second toe. In fact I am concerned today about possible cellulitis around this wound. The areas extending up his second toe and although there is deformities here almost appears to abut on the nailbed. 10/06/17; left lateral leg wound continues to make very gradual progress. Tissue culture I did from the right first second toe dorsal foot last time grew MRSA and enterococcus which was vancomycin sensitive. This was not sensitive to clindamycin or doxycycline. He is allergic to Zyvox and sulfa we have therefore arrange for him to have dalvance infusion tomorrow. He is had this in the past and tolerated it well 10/20/17; left lateral leg wound continues to make decent progress. This is certainly reduced in terms of with there is advancing epithelialization.ooThe cellulitis in the right foot looks better although he still has [name] deep wound in the dorsal aspect of the first second toe web space. Plantar left first toe web space on the left I think is making some progress 10/27/17; left lateral leg wound continues to make decent progress. Advancing epithelialization.using Hydrofera Blue ooThe right first second toe web space wound is better-looking using silver alginate ooImprovement in the left plantar first second toe web space. Again using silver alginate 11/03/17 left lateral leg wound continues to make decent progress albeit slowly. Using Hydrofera Blue ooThe right per second toe web space continues to be [name] very problematic looking punched out wound. I obtained [name] piece of tissue for deep culture I did extensively treated this for fungus. It is difficult to imagine that this is [name] pressure area as the patient states other than going outside he doesn't really wear shoes at home ooThe left plantar first second toe web space looked fairly senescent. Necrotic edges. This required debridement oochange to Hydrofera Blue  to all wound areas 11/10/17; left lateral leg wound continues to contract. Using Hydrofera Blue ooOn the right dorsal first second toe web space dorsally. Culture I did of this area last week grew MRSA there is not an easy oral option in this patient was multiple antibiotic allergies or intolerances. This was only [name] rare culture isolate I'm therefore going to use Bactroban under silver alginate ooOn the left plantar first second toe web space. Debridement is required here. This is also unchanged 11/17/17; left lateral leg wound continues to contract using Hydrofera Blue this is no longer the major issue. ooThe major concern here is the right first second toe web space. He now has an open area going from dorsally to  the plantar aspect. There is now wound on the inner lateral part of the first toe. Not [name] very viable surface on this. There is erythema spreading medially into the forefoot. ooNo major change in the left first second toe plantar wound 11/24/17; left lateral leg wound continues to contract using Hydrofera Blue. Nice improvement today ooThe right first second toe web space all of this looks [name] lot less angry than last week. I have given him clindamycin and topical Bactroban for MRSA and terbinafine for the possibility of underlining tinea pedis that I could not control with ketoconazole. Looks somewhat better ooThe area on the plantar left first second toe web space is weeping with dried debris around the wound 12/01/17; left lateral leg wound continues to contract he Hydrofera Blue. It is becoming thinner in terms of with nevertheless it is making good improvement. ooThe right first second toe web space looks less angry but still [name] large necrotic-looking wounds starting on the plantar aspect of the right foot extending between the toes and now extensively on the base of the right second toe. I gave him clindamycin and topical Bactroban for MRSA anterior benefiting for the possibility of  underlying tinea pedis. Not looking better today ooThe area on the left first/second toe looks better. Debrided of necrotic debris 12/05/17* the patient was worked in urgently today because over the weekend he found blood on his incontinence bad when he woke up. He was found to have an ulcer by his wife who does most of his wound care. He came in today for [place] to look at this. He has not had [name] history of wounds in his buttocks in spite of his paraplegia. 12/08/17; seen in follow-up today at his usual appointment. He was seen earlier this week and found to have [name] new wound on his buttock. We also follow him for wounds on the left lateral leg, left first second toe web space and right first second toe web space 12/15/17; we have been using Hydrofera Blue to the left lateral leg which has improved. The right first second toe web space has also improved. Left first second toe web space plantar aspect looks stable. The left buttock has worsened using Santyl. Apparently the buttock has drainage 12/22/17; we have been using Hydrofera Blue to the left lateral leg which continues to improve now 2 small wounds separated by normal skin. He tells [place] he had [name] fever up to 100 yesterday he is prone to UTIs but has not noted anything different. He does in and out catheterizations. The area between the first and second toes today does not look good necrotic surface covered with what looks to be purulent drainage and erythema extending into the third toe. I had gotten this to something that I thought look better last time however it is not look good today. He also has [name] necrotic surface over the buttock wound which is expanded. I thought there might be infection under here so I removed [name] lot of the surface with [name] #5 curet though nothing look like it really needed culturing. He is been using Santyl to this area 12/27/17; his original wound on the left lateral leg continues to improve using Hydrofera Blue. I gave him samples  of Baxdella although he was unable to take them out of fear for an allergic reaction ["lump in his throat"].the culture I did of the purulent drainage from his second toe last week showed both enterococcus and [name] set Enterobacter I was also concerned  about the erythema on the bottom of his foot although paradoxically although this looks somewhat better today. 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 We've been using silver alginate here. They came up today that he sleeps in [name] recliner, I'm not sure why but I asked him to stop this 01/03/18; his original wound we've been using Hydrofera Blue is now separated into 2 areas. ooUlcer on his left buttock is better he is off the recliner and sleeping in bed ooFinally both wound areas between his first and second toes also looks some better 01/10/18; his original wound on the left lateral leg is now separated into 2 wounds we've been using Hydrofera Blue ooUlcer on his left buttock has some drainage. There is [name] small probing site going into muscle layer superiorly.using silver alginate -He arrives today with [name] deep tissue injury on the left heel ooThe wound on the dorsal aspect of his first second toe on the left looks [name] lot betterusing silver alginate ketoconazole ooThe area on the first second toe web space on the right also looks [name] lot bette 01/17/18; his original wound on the left lateral leg continues to progress using Hydrofera Blue ooUlcer on his left buttock also is smaller surface healthier except for [name] small probing site going into the muscle layer superiorly. 2.4 cm of tunneling in this area ooDTI on his left heel we have only been offloading. Looks better than last week no threatened open no evidence of infection oothe wound on the dorsal aspect of the first second toe on the left continues to look like it's regressing we have only been using  silver alginate and terbinafine orally ooThe area in the first second toe web space on the right also looks to be [name] lot better using silver alginate and terbinafine I think this was prompted by tinea pedis 01/31/18; the patient was hospitalized in [place] last week apparently for [name] complicated UTI. He was discharged on cefepime he does in and out catheterizations. In the hospital he was discovered M I don't mild elevation of AST and ALT and the terbinafine was stopped.predictably the pressure ulcer on s his buttock looks betterusing silver alginate. The area on the left lateral leg also is better using Hydrofera Blue. The area between the first and second toes on the left better. First and second toes on the right still substantial but better. Finally the DTI on the left heel has held together and looks like it's resolving 02/07/18-he is here in follow-up evaluation for multiple ulcerations. He has new injury to the lateral aspect of the last issue [name] pressure ulcer, he states this is from adhesive removal trauma. He states he has tried multiple adhesive products with no success. All other ulcers appear stable. The left heel DTI is resolving. We will continue with same treatment plan and follow-up next week. 02/14/18; follow-up for multiple areas. ooHe has [name] new area last week on the lateral aspect of his pressure ulcer more over the posterior trochanter. The original pressure ulcer looks quite stable has healthy granulation. 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 Almost fully epithelialized on the original superior area using Hydrofera Blue ooDTI on the left heel has peeled off this week to reveal [name] small superficial wound under denuded skin and subcutaneous tissue ooBoth areas between the first and  second toes look better including nothing open on the left 02/21/18; ooThe patient's  wounds on his left ischial tuberosity and posterior left greater trochanter actually looked better. He has [name] large area of irritation around the area which I think is contact dermatitis. I am doubtful that this is fungal ooHis original wound on the left lateral calf continues to improve we have been using Hydrofera Blue ooThere is no open area in the left first second toe web space although there is [name] lot of thick callus ooThe DTI on the left heel required debridement today of necrotic surface eschar and subcutaneous tissue using silver alginate ooFinally the area on the right first second toe webspace continues to contract using silver alginate and ketoconazole 02/28/18 ooLeft ischial tuberosity wounds look better using silver alginate. ooOriginal wound on the left calf only has one small open area left using Hydrofera Blue ooDTI on the left heel required debridement mostly removing skin from around this wound surface. Using silver alginate ooThe areas on the right first/second toe web space using silver alginate and ketoconazole 03/08/18 on evaluation today patient appears to be doing decently well as best I can tell in regard to his wounds. This is the first time that I have seen him as he generally is followed by Dr. Dellia Nims. With that being said none of his wounds appear to be infected he does have an area where there is some skin covering what appears to be [name] new wound on the left dorsal surface of his great toe. This is right at the nail bed. With that being said I do believe that debrided away some of the excess skin can be of benefit in this regard. Otherwise he has been tolerating the dressing changes without complication. 03/14/18; patient arrives today with the multiplicity of wounds that we are following. He has not been systemically unwell ooOriginal wound on the left lateral calf now only has 2 small open areas we've been using Hydrofera Blue which should continue ooThe deep  tissue injury on the left heel requires debridement today. We've been using silver alginate ooThe left first second toe and the right first second toe are both are reminiscence what I think was tinea pedis. Apparently some of the callus Surface between the toes was removed last week when it started draining. ooPurulent drainage coming from the wound on the ischial tuberosity on the left. 03/21/18-He is here in follow-up evaluation for multiple wounds. 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 He tolerated debridement. The only change to last week's recommendations is to discontinue antifungal cream between toes. He will follow-up next week 03/28/18; following up for multiple wounds;Concern this week is streaking redness and swelling in the right foot. He is going to need antibiotics for this. 03/31/18; follow-up for right foot cellulitis. Streaking redness and swelling in the right foot on 03/28/18. He has multiple antibiotic intolerances and [name] history of MRSA. I put him on clindamycin 300 mg every 6 and brought him in for [name] quick check. He has an open wound between his first and second toes on the right foot as [name] potential source. 04/04/18; ooRight foot cellulitis is resolving he is completing clindamycin. This is truly good news ooLeft lateral calf wound which is initial wound only has one small open area inferiorly this is close to healing out. He has compression stockings. We will use Hydrofera Blue right down to the epithelialization of this ooNonviable surface on the  left heel which was initially pressure with [name] DTI. We've been using Hydrofera Blue. I'm going to switch this back to silver alginate ooLeft first second toe/tinea pedis this looks better using silver alginate ooRight first second toe tinea pedis using silver alginate ooLarge pressure ulcers on theLeft ischial tuberosity. Small wound here Looks better. I  am uncertain about the surface over the large wound. Using silver alginate 04/11/18; ooCellulitis in the right foot is resolved ooLeft lateral calf wound which was his original wounds still has 2 tiny open areas remaining this is just about closed ooNonviable surface on the left heel is better but still requires debridement ooLeft first second toe/tinea pedis still open using silver alginate ooRight first second toe wound tinea pedis I asked him to go back to using ketoconazole and silver alginate ooLarge pressure ulcers on the left ischial tuberosity this shear injury here is resolved. Wound is smaller. No evidence of infection using silver alginate 04/18/18; ooPatient arrives with an intense area of cellulitis in the right mid lower calf extending into the right heel area. Bright red and warm. Smaller area on the left anterior leg. He has [name] significant history of MRSA. He will definitely need antibioticsoodoxycycline ooHe now has 2 open areas on the left ischial tuberosity the original large wound and now [name] satellite area which I think was above his initial satellite areas. Not [name] wonderful surface on this satellite area surrounding erythema which looks like pressure related. ooHis left lateral calf wound again his original wound is just about closed ooLeft heel pressure injury still requiring debridement ooLeft first second toe looks [name] lot better using silver alginate ooRight first second toe also using silver alginate and ketoconazole cream also looks better 04/20/18; the patient was worked in early today out of concerns with his cellulitis on the right leg. I had started him on doxycycline. This was 2 days ago. His wife was concerned about the swelling in the area. Also concerned about the left buttock. He has not been systemically unwell no fever chills. No nausea vomiting or diarrhea 04/25/18; the patient's left buttock wound is continued to deteriorate he is using Hydrofera Blue.  He is still completing clindamycin for the cellulitis on the right leg although all of this looks better. 05/02/18 ooLeft buttock wound still with [name] lot of drainage and [name] very tightly adherent fibrinous necrotic surface. He has [name] deeper area superiorly ooThe left lateral calf wound is still closed ooDTI wound on the left heel necrotic surface especially the circumference using Iodoflex ooAreas between his left first second toe and right first second toe both look better. Dorsally and the right first second toe he had [name] necrotic surface although at smaller. In using silver alginate and ketoconazole. I did [name] culture last week which was [name] deep tissue culture of the reminiscence of the open wound on the right first second toe dorsally. This grew [name] few Acinetobacter and [name] few methicillin-resistant staph aureus. Nevertheless the area actually this week looked better. I didn't feel the need to specifically address this at least in terms of systemic antibiotics. 05/09/18; wounds are measuring larger more drainage per our intake. We are using Santyl covered with alginate on the large superficial buttock wounds, Iodosorb on the left heel, ketoconazole and silver alginate to the dorsal first and second toes bilaterally. 05/16/18; ooThe area on his left buttock better in some aspects although the area superiorly over the ischial tuberosity required an extensive debridement.using Santyl ooLeft heel appears stable. Using  Iodoflex ooThe areas between his first and second toes are not bad however there is spreading erythema up the dorsal aspect of his left foot this looks like cellulitis again. He is insensate the erythema is really very brilliant.o Erysipelas He went to see an allergist days ago because he was itching part of this he had lab work done. This showed [name] white count of 15.1 with 70% neutrophils. Hemoglobin of 11.4 and [name] platelet count of 659,000. Last white count we had in [facility] was [name] 2-1/2 years  ago which was 25.9 but he was ill at the time. He was able to show me some lab work that was done by his primary physician the pattern is about the same. 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 But prompted me to go ahead and do x-rays of both feet and the pelvis rule out osteomyelitis. He also had [name] comprehensive metabolic panel this was reasonably normal his albumin was 3.7 liver function tests BUN/creatinine all normal 05/23/18; x-rays of both his feet from last week were negative for underlying pulmonary abnormality. The x-ray of his pelvis however showed mild irregularity in the left ischial which may represent some early osteomyelitis. The wound in the left ischial continues to get deeper clearly now exposed muscle. Each week necrotic surface material over this area. Whereas the rest of the wounds do not look so bad. ooThe left ischial wound we have been using Santyl and calcium alginate ooT the left heel surface necrotic debris using Iodoflex o ooThe left lateral leg is still healed ooAreas on the left dorsal foot and the right dorsal foot are about the same. There is some inflammation on the left which might represent contact dermatitis, fungal dermatitis I am doubtful cellulitis although this looks better than last week 05/30/18; CT scan done at Hospital did not show any osteomyelitis or abscess. Suggested the possibility of underlying cellulitis although I don't see [name] lot of evidence of this at the bedside ooThe wound itself on the left buttock/upper thigh actually looks somewhat better. No debridement ooLeft heel also looks better no debridement continue Iodoflex ooBoth dorsal first second toe spaces appear better using Lotrisone. Left still required debridement 06/06/18; ooIntake reported some purulent looking drainage from the left gluteal wound. Using Santyl and calcium alginate ooLeft heel looks better although still [name] nonviable  surface requiring debridement ooThe left dorsal foot first/second webspace actually expanding and somewhat deeper. I may consider doing [name] shave biopsy of this area ooRight dorsal foot first/second webspace appears stable to improved. Using Lotrisone and silver alginate to both these areas 06/13/18 ooLeft gluteal surface looks better. Now separated in the 2 wounds. No debridement required. Still drainage. We'll continue silver alginate ooLeft heel continues to look better with Iodoflex continue this for at least another week ooOf his dorsal foot wounds the area on the left still has some depth although it looks better than last week. We've been using Lotrisone and silver alginate 06/20/18 ooLeft gluteal continues to look better healthy tissue ooLeft heel continues to look better healthy granulation wound is smaller. He is using Iodoflex and his long as this continues continue the Iodoflex ooDorsal right foot looks better unfortunately dorsal left foot does not. There is swelling and erythema of his forefoot. He had minor trauma to this several days ago but doesn't think this was enough to have caused any tissue injury. Foot looks like cellulitis, we have had this problem before 06/27/18  on evaluation today patient appears to be doing [name] little worse in regard to his foot ulcer. 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 Both of which would really be his only options for treating this infection. In the past he has been given and effusion of Orbactiv. This is done very well for him in the past again it's one time dosing IV antibiotic therapy. Subsequently I do believe this is something we're gonna need to see about doing at this point in time. Currently his other wounds seem to be doing somewhat better in my pinion I'm pretty happy in that regard. 07/03/18 on  evaluation today patient's wounds actually appear to be doing fairly well. He has been tolerating the dressing changes without complication. All in all he seems to be showing signs of improvement. In regard to the antibiotics he has been dealing with infectious disease since I saw him last week as far as getting this scheduled. In the end [facility]'s [facility] this coming Friday. In the meantime he has been continuing to perform the dressing changes in such as previous. There does not appear to be any evidence of infection worsengin at this time. 07/10/18; ooSince I last saw this man 2 [name] ago things have actually improved. IV antibiotics of resulted in less forefoot erythema although there is still some present. He is not systemically unwell ooLeft buttock wounds o2 now have no depth there is increased epithelialization Using silver alginate ooLeft heel still requires debridement using Iodoflex ooLeft dorsal foot still with [name] sizable wound about the size of [name] border but healthy granulation ooRight dorsal foot still with [name] slitlike area using silver alginate 07/18/18; the patient's cellulitis in the left foot is improved in fact I think it is on its way to resolving. ooLeft buttock wounds o2 both look better although the larger one has hypertension granulation we've been using silver alginate ooLeft heel has some thick circumferential redundant skin over the wound edge which will need to be removed today we've been using Iodoflex ooLeft dorsal foot is still [name] sizable wound required debridement using silver alginate ooThe right dorsal foot is just about closed only [name] small open area remains here 07/25/18; left foot cellulitis is resolved ooLeft buttock wounds o2 both look better. Hyper-granulation on the major area ooLeft heel as some debris over the surface but otherwise looks [name] healthier wound. Using silver collagen ooRight dorsal  foot is just about closed 07/31/18; arrives with our intake nurse worried about purulent drainage from the buttock. We had hyper-granulation here last week ooHis buttock wounds o2 continue to look better ooLeft heel some debris over the surface but measuring smaller. ooRight dorsal foot unfortunately has openings between the toes ooLeft foot superficial wound looks less aggravated. 08/07/18 ooButtock wounds continue to look better although some of her granulation and the larger medial wound. silver alginate ooLeft heel continues to look [name] lot better.silver collagen ooLeft foot superficial wound looks less stable. Requires debridement. He has [name] new wound superficial area on the foot on the lateral dorsal foot. ooRight foot looks better using silver alginate without Lotrisone 08/14/2018; patient was in the ER last week diagnosed with [name] UTI. He is now on Cefpodoxime and Macrodantin. ooButtock wounds continued to be smaller. Using silver alginate ooLeft heel continues to  look better using silver collagen ooLeft foot superficial wound looks as though it is improving ooRight dorsal foot area is just about healed. 08/21/2018; patient is completed his antibiotics for his UTI. ooHe has 2 open areas on the buttocks. There is still not closed although the surface looks satisfactory. Using silver alginate ooLeft heel continues to improve using silver collagen ooThe bilateral dorsal foot areas which are at the base of his first and second toes/possible tinea pedis are actually stable on the left but worse on the right. The area on the left required debridement of necrotic surface. After debridement I obtained [name] specimen for PCR culture. ooThe right dorsal foot which is been just about healed last week is now reopened 08/28/2018; culture done on the left dorsal foot showed coag negative staph both staph epidermidis and Lugdunensis. I think this is worthwhile initiating systemic treatment. I will  use doxycycline given his long list of allergies. The area on the left heel slightly improved but still requiring debridement. ooThe large wound on the buttock is just about closed whereas the smaller one is larger. Using silver alginate in this area 09/04/2018; patient is completing his doxycycline for the left foot although this continues to be [name] very difficult wound area with very adherent necrotic debris. We are using silver alginate to all his wounds right foot left foot and the small wounds on his buttock, silver collagen on the left heel. 09/11/2018; once again this patient has intense erythema and swelling of the left forefoot. Lesser degrees of erythema in the right foot. He has [name] long list of allergies and intolerances. I will reinstitute doxycycline. 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 Using silver alginate ooLeft heel also looks better using silver collagen ooUnfortunately both the areas on his feet look worse. The area on the left first second webspace is now gone through to the plantar part of his foot. The area on the left foot anteriorly is irritated with erythema and swelling in the forefoot. 09/25/2018 ooHis wound on the left plantar heel looks better. Using silver collagen ooThe area on the left buttock 2 small remnant areas. One is closed one is still open. Using silver alginate ooThe areas between both his first and second toes look worse. This in spite of long-standing antifungal therapy with ketoconazole and silver alginate which should have antifungal activity ooHe has small areas around his original wound on the left calf one is on the bottom of the original scar tissue and one superiorly both of these are small and superficial but again given wound history in this site this is worrisome 10/02/2018 ooLeft plantar heel continues to gradually contract using silver collagen ooLeft buttock wound is unchanged  using silver alginate ooThe areas on his dorsal feet between his first and second toes bilaterally look about the same. 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 Small open area here. We have been using silver alginate 10/09/2018; ooLeft plantar heel continues to look healthy and contract. No debridement is required ooLeft buttock slightly smaller with [name] tape injury wound just below which was new this week ooDorsal feet somewhat improved I have been using clindamycin ooLeft lateral looks lower extremity the actual open area looks worse although [name] lot of this is epithelialized. I am going to  change to silver collagen today He has [name] lot more swelling in the right leg although this is not pitting not red and not particularly warm there is [name] lot of spasm in the right leg usually indicative of people with paralysis of some underlying discomfort. We have reviewed his vascular status from 2017 he had [name] left greater saphenous vein ablation. I wonder about referring him back to vascular surgery if the area on the left leg continues to deteriorate. 10/16/2018 in today for follow-up and management of multiple lower extremity ulcers. His left Buttock wound is much lower smaller and almost closed completely. The wound to the left ankle has began to reopen with Epithelialization and some adherent slough. He has multiple new areas to the left foot and leg. The left dorsal foot without much improvement. Wound present between left great webspace and 2nd toe. Erythema and edema present right leg. Right LE ultrasound obtained on 10/10/18 was negative for DVT . 10/23/2018; ooLeft buttock is closed over. Still dry macerated skin but there is no open wound. I suspect this is chronic pressure/moisture ooLeft lateral calf is quite [name] bit  worse than when I saw this last. There is clearly drainage here he has macerated skin into the left plantar heel. We will change the primary dressing to alginate ooLeft dorsal foot has some improvement in overall wound area. Still using clindamycin and silver alginate ooRight dorsal foot about the same as the left using clindamycin and silver alginate ooThe erythema in the right leg has resolved. He is DVT rule out was negative ooLeft heel pressure area required debridement although the wound is smaller and the surface is health 10/26/2018 ooThe patient came back in for his nurse check today predominantly because of the drainage coming out of the left lateral leg with [name] recent reopening of his original wound on the left lateral calf. He comes in today with [name] large amount of surrounding erythema around the wound extending from the calf into the ankle and even in the area on the dorsal foot. He is not systemically unwell. He is not febrile. Nevertheless this looks like cellulitis. We have been using silver alginate to the area. I changed him to [name] regular visit and I am going to prescribe him doxycycline. The rationale here is [name] long list of medication intolerances and [name] history of MRSA. I did not see anything that I thought would provide [name] valuable culture 10/30/2018 ooFollow-up from his appointment 4 days ago with really an extensive area of cellulitis in the left calf left lateral ankle and left dorsal foot. I put him on doxycycline. He has [name] long list of medication allergies which are true allergy reactions. 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 In any case he is [name] lot better today. The erythema especially in the anterior and lateral left calf is better. He still has left ankle erythema. He also is complaining about increasing edema in the right leg we have only been using Kerlix Coban and he has been doing the wraps at  home. Finally he has [name] spotty rash on the medial part of his upper left calf which looks like folliculitis or perhaps wrap occlusion type injury. Small superficial macules not pustules 11/06/18 patient arrives today with again [name] considerable degree of erythema around the wound on the left lateral calf extending into the dorsal ankle and dorsal foot. This is [name] lot worse than when I saw  this last week. He is on doxycycline really with not [name] lot of improvement. He has not been systemically unwell Wounds on the; left heel actually looks improved. Original area on the left foot and proximity to the first and second toes looks about the same. He has superficial areas on the dorsal foot, anterior calf and then the reopening of his original wound on the left lateral calf which looks about the same ooThe only area he has on the right is the dorsal webspace first and second which is smaller. ooHe has [name] large area of dry erythematous skin on the left buttock small open area here. 11/13/2018; the patient arrives in much better condition. The erythema around the wound on the left lateral calf is [name] lot better. Not sure whether this was the clindamycin or the TCA and ketoconazole or just in the improvement in edema control [stasis dermatitis]. In any case this is [name] lot better. The area on the left heel is very small and just about resolved using silver collagen we have been using silver alginate to the areas on his dorsal feet 11/20/2018; his wounds include the left lateral calf, left heel, dorsal aspects of both feet just proximal to the first second webspace. He is stable to slightly improved. I did not think any changes to his dressings were going to be necessary 11/27/2018 he has [name] reopening on the left buttock which is surrounded by what looks like tinea or perhaps some other form of dermatitis. The area on the left dorsal foot has some erythema around it I have marked this area but I am not sure whether this is  cellulitis or not. Left heel is not closed. Left calf the reopening is really slightly longer and probably worse 1/13; in general things look better and smaller except for the left dorsal foot. Area on the left heel is just about closed, left buttock looks better only [name] small wound remains in the skin looks better [using Lotrisone] 1/20; the area on the left heel only has [name] few remaining open areas here. Left lateral calf about the same in terms of size, left dorsal foot slightly larger right lateral foot still not closed. The area on the left buttock has no open wound and the surrounding skin looks [name] lot better 1/27; the area on the left heel is closed. Left lateral calf better but still requiring extensive debridements. The area on his left buttock is closed. He still has the open areas on the left dorsal foot which is slightly smaller in the right foot which is slightly expanded. We have been using Iodoflex on these areas as well 2/3; left heel is closed. Left lateral calf still requiring debridement using Iodoflex there is no open area on his left buttock however he has dry scaly skin over [name] large area of this. Not really responding well to the Lotrisone. Finally the areas on his dorsal feet at the level of the first second webspace are slightly smaller on the right and about the same on the left. Both of these vigorously debrided with Anasept and gauze 2/10; left heel remains closed he has dry erythematous skin over the left buttock but there is no open wound here. Left lateral leg has come in and with. Still requiring debridement we have been using Iodoflex here. Finally the area on the left dorsal foot and right dorsal foot are really about the same extremely dry callused fissured areas. He does not yet have [name] dermatology appointment 2/17;  left heel remains closed. He has [name] new open area on the left buttock. The area on the left lateral calf is bigger longer and still covered in necrotic  debris. No major change in his foot areas bilaterally. I am awaiting for [name] dermatologist to look on this. We have been using ketoconazole I do not know that this is been doing any good at all. 2/24; left heel remains closed. The left buttock wound that was new reopening last week looks better. The left lateral calf appears better also although still requires debridement. The major area on his foot is the left first second also requiring debridement. We have been putting Prisma on all wounds. I do not believe that the ketoconazole has done too much good for his feet. He will use Lotrisone I am going to give him [name] 2-week course of terbinafine. We still do not have [name] dermatology appointment 3/2 left heel remains closed however there is skin over bone in this area I pointed this out to him today. The left buttock wound is epithelialized but still does not look completely stable. The area on the left leg required debridement were using silver collagen here. With regards to his feet we changed to Lotrisone last week and silver alginate. 3/9; left heel remains closed. Left buttock remains closed. The area on the right foot is essentially closed. The left foot remains unchanged. Slightly smaller on the left lateral calf. Using silver collagen to both of these areas 3/16-Left heel remains closed. Area on right foot is closed. Left lateral calf above the lateral malleolus open wound requiring debridement with easy bleeding. Left dorsal wound proximal to first toe also debrided. Left ischial area open new. Patient has been using Prisma with wrapping every 3 days. Dermatology appointment is apparently tomorrow.Patient has completed his terbinafine 2-week course with some apparent improvement according to him, there is still flaking and dry skin in his foot on the left 3/23; area on the right foot is reopened. The area on the left anterior foot is about the same still [name] very necrotic adherent surface. He still  has the area on the left leg and reopening is on the left buttock. He apparently saw dermatology although I do not have [name] note. According to the patient who is usually fairly well informed they did not have any good ideas. Put him on oral terbinafine which she is been on before. 3/30; using silver collagen to all wounds. Apparently his dermatologist put him on doxycycline and rifampin presumably some culture grew staph. I do not have this result. He remains on terbinafine although I have used terbinafine on him before 4/6; patient has had [name] fairly substantial reopening on the right foot between the first and second toes. He is finished his terbinafine and I believe is on doxycycline and rifampin still as prescribed by dermatology. We have been using silver collagen to all his wounds although the patient reports that he thinks silver alginate does better on the wounds on his buttock. 4/13; the area on his left lateral calf about the same size but it did not require debridement. ooLeft dorsal foot just proximal to the webspace between the first and second toes is about the same. Still nonviable surface. I note some superficial bronze discoloration of the dorsal part of his foot ooRight dorsal foot just proximal to the first and second toes also looks about the same. I still think there may be the same discoloration I noted above on the left ooLeft  buttock wound looks about the same 4/20; left lateral calf appears to be gradually contracting using silver collagen. 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 The left dorsal foot wound is debrided of tightly adherent necrotic debris and really cleans up quite nicely. The right area is worse with expansion. I did not debride this it is now over the base of the second toe ooThe area on his left buttock is smaller no debridement is required using silver collagen 5/4; left calf continues to make  good progress. ooHe arrives with erythema around the wounds on his dorsal foot which even extends to the plantar aspect. Very concerning for coexistent infection. He is finished the erythromycin I gave him for possible erythrasma this does not seem to have helped. ooThe area on the left foot is about the same base of the dorsal toes ooIs area on the buttock looks improved on the left 5/11; left calf and left buttock continued to make good progress. Left foot is about the same to slightly improved. ooMajor problem is on the right foot. He has not had an x-ray. Deep tissue culture I did last week showed both Enterobacter and E. coli. I did not change the doxycycline I put him on empirically although neither 1 of these were plated to doxycycline. He arrives today with the erythema looking worse on both the dorsal and plantar foot. Macerated skin on the bottom of the foot. he has not been systemically unwell 5/18-Patient returns at 1 week, left calf wound appears to be making some progress, left buttock wound appears slightly worse than last time, left foot wound looks slightly better, right foot redness is marginally better. X-ray of both feet show no air or evidence of osteomyelitis. Patient is finished his Omnicef and terbinafine. He continues to have macerated skin on the bottom of the left foot as well as right 5/26; left calf wound is better, left buttock wound appears to have multiple small superficial open areas with surrounding macerated skin. X-rays that I did last time showed no evidence of osteomyelitis in either foot. He is finished cefdinir and doxycycline. I do not think that he was on terbinafine. He continues to have [name] large superficial open area on the right foot anterior dorsal and slightly between the first and second toes. I did send him to dermatology 2 months ago or so wondering about whether they would do [name] fungal scraping. I do not believe they did but did do [name] culture. We  have been using silver alginate to the toe areas, he has been using antifungals at home topically either ketoconazole or Lotrisone. We are using silver collagen on the left foot, silver alginate on the right, silver collagen on the left lateral leg and silver alginate on the left buttock 6/1; left buttock area is healed. We have the left dorsal foot, left lateral leg and right dorsal foot. We are using silver alginate to the areas on both feet and silver collagen to the area on his left lateral calf 6/8; the left buttock apparently reopened late last week. He is not really sure how this happened. He is tolerating the terbinafine. Using silver alginate to all wounds 6/15; left buttock wound is larger than last week but still superficial. ooCame in the clinic today with [name] report of purulence from the left lateral leg I did not identify any infection ooBoth areas on his dorsal feet appear to be better. He is tolerating the terbinafine. Using silver  alginate to all wounds 6/22; left buttock is about the same this week, left calf quite [name] bit better. His left foot is about the same however he comes in with erythema and warmth in the right forefoot once again. Culture that I gave him in the beginning of May showed Enterobacter and E. coli. I gave him doxycycline and things seem to improve although neither 1 of these organisms was specifically plated. 6/29; left buttock is larger and dry this week. Left lateral calf looks to me to be improved. Left dorsal foot also somewhat improved right foot completely unchanged. The erythema on the right foot is still present. He is completing the Ceftin dinner that I gave him empirically [see discussion above.) 7/6 - All wounds look to be stable and perhaps improved, the left buttock wound is slightly smaller, per patient bleeds easily, completed ceftin, the right foot redness is less, he is on terbinafine 7/13; left buttock wound about the same perhaps slightly  narrower. Area on the left lateral leg continues to narrow. Left dorsal foot slightly smaller right foot about the same. We are using silver alginate on the right foot and Hydrofera Blue to the areas on the left. Unna boot on the left 2 layer compression on the right 7/20; left buttock wound absolutely the same. Area on lateral leg continues to get better. Left dorsal foot require debridement as did the right no major change in the 7/27; left buttock wound the same size necrotic debris over the surface. The area on the lateral leg is closed once again. His left foot looks better right foot about the same although there is some involvement now of the posterior first second toe area. He is still on terbinafine which I have given him for [name] month, not certain [name] centimeter major change 06/25/19-All wounds appear to be slightly improved according to report, left buttock wound looks clean, both foot wounds have minimal to no debris the right dorsal foot has minimal slough. We are using Hydrofera Blue to the left and silver alginate to the right foot and ischial wound. 8/10-Wounds all appear to be around the same, the right forefoot distal part has some redness which was not there before, however the wound looks clean and small. Ischial wound looks about the same with no changes 8/17; his wound on the left lateral calf which was his original chronic venous insufficiency wound remains closed. Since I last saw him the areas on the left dorsal foot right dorsal foot generally appear better but require debridement. The area on his left initial tuberosity appears somewhat larger to me perhaps hyper granulated and bleeds very easily. We have been using Hydrofera Blue to the left dorsal foot and silver alginate to everything else 8/24; left lateral calf remains closed. The areas on his dorsal feet on the webspace of the first and second toes bilaterally both look better. The area on the left buttock which is the  pressure ulcer stage II slightly smaller. I change the dressing to Hydrofera Blue to all areas 8/31; left lateral calf remains closed. The area on his dorsal feet bilaterally look better. Using Hydrofera Blue. Still requiring debridement on the left foot. No change in the left buttock pressure ulcers however 9/14; left lateral calf remains closed. Dorsal feet look quite [name] bit better than 2 [name] ago. Flaking dry skin also [name] lot better with the ammonium lactate I gave him 2 [name] ago. The area on the left buttock is improved. He states that  his Roho cushion developed [name] leak and he is getting [name] new one, in the interim he is offloading this vigorously 9/21; left calf remains closed. Left heel which was [name] possible DTI looks better this week. He had macerated tissue around the left dorsal foot right foot looks satisfactory and improved left buttock wound. I changed his dressings to his feet to silver alginate bilaterally. Continuing Hydrofera Blue on the left buttock. 9/28 left calf remains closed. Left heel did not develop anything [possible DTI] dry flaking skin on the left dorsal foot. Right foot looks satisfactory. Improved left buttock wound. We are using silver alginate on his feet Hydrofera Blue on the buttock. I have asked him to go back to the Lotrisone on his feet including the wounds and surrounding areas 10/5; left calf remains closed. The areas on the left and right feet about the same. [name] lot of this is epithelialized however debris over the remaining open areas. He is using Lotrisone and silver alginate. The area on the left buttock using Hydrofera Blue 10/26. Patient has been out for 3 [name] secondary to Covid concerns. He tested negative but I think his wife tested positive. He comes in today with the left foot substantially worse, right foot about the same. Even more concerning he states that the area on his left buttock closed over but then reopened and is considerably deeper in one  aspect than it was before [stage III wound] 11/2; left foot really about the same as last week. Quarter sized wound on the dorsal foot just proximal to the first second toes. Surrounding erythema with areas of denuded epithelium. This is not really much different looking. Did not look like cellulitis this time however. ooRight foot area about the same.. We have been using silver alginate alginate on his toes ooLeft buttock still substantial irritated skin around the wound which I think looks somewhat better. We have been using Hydrofera Blue here. 11/9; left foot larger than last week and [name] very necrotic surface. Right foot I think is about the same perhaps slightly smaller. Debris around the circumference also addressed. Unfortunately on the left buttock there is been [name] decline. Satellite lesions below the major wound distally and now [name] an additional one posteriorly we have been using Hydrofera Blue but I think this is [name] pressure issue 11/16; left foot ulcer dorsally again [name] very adherent necrotic surface. Right foot is about the same. Not much change in the pressure ulcer on his left buttock. 11/30; left foot ulcer dorsally basically the same as when I saw him 2 [name] ago. Very adherent fibrinous debris on the wound surface. Patient reports [name] lot of drainage as well. 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 We have been using Iodoflex, patient changed back to alginate because of the drainage. Area on his right dorsal foot really looks benign with [name] healthier surface certainly [name] lot better than on the left. Left buttock wounds all improved using Hydrofera Blue 12/7; left dorsal foot again no improvement. Tightly adherent debris. PCR culture I did last week only showed likely skin contaminant. I have gone ahead and done [name] punch biopsy of this which is about the last thing in terms of investigations I can think to do. He has known venous insufficiency  and venous hypertension and this could be the issue here. The area on the right foot is about the same left buttock slightly worse according to our intake nurse secondary to  Hydrofera Blue sticking to the wound 12/14; biopsy of the left foot that I did last time showed changes that could be related to wound healing/chronic stasis dermatitis phenomenon no neoplasm. We have been using silver alginate to both feet. I change the one on the left today to Sorbact and silver alginate to his other 2 wounds 12/28; the patient arrives with the following problems; ooMajor issue is the dorsal left foot which continues to be [name] larger deeper wound area. Still with [name] completely nonviable surface ooParadoxically the area mirror image on the right on the right dorsal foot appears to be getting better. ooHe had some loss of dry denuded skin from the lower part of his original wound on the left lateral calf. Some of this area looked [name] little vulnerable and for this reason we put him in wrap that on this side this week ooThe area on his left buttock is larger. He still has the erythematous circular area which I think is [name] combination of pressure, sweat. This does not look like cellulitis or fungal dermatitis 11/26/2019; -Dorsal left foot large open wound with depth. Still debris over the surface. Using Sorbact ooThe area on the dorsal right foot paradoxically has closed over [facility] has [name] reopening on the left ankle laterally at the base of his original wound that extended up into the calf. This appears clean. ooThe left buttock wound is smaller but with very adherent necrotic debris over the surface. We have been using silver alginate here as well The patient had arterial studies done in 2017. He had biphasic waveforms at the dorsalis pedis and posterior tibial bilaterally. ABI in the left was 1.17. Digit waveforms were dampened. He has slight spasticity in the great toes I do not think [name] TBI would be  possible 1/11; the patient comes in today with [name] sizable reopening between the first and second toes on the right. This is not exactly in the same location where we have been treating wounds previously. According to our intake nurse this was actually fairly deep but 0.6 cm. The area on the left dorsal foot looks about the same the surface is somewhat cleaner using Sorbact, his MRI is in 2 days. We have not managed yet to get arterial studies. The new reopening on the left lateral calf looks somewhat better using alginate. The left buttock wound is about the same using alginate 1/18; the patient had his ARTERIAL studies which were quite normal. ABI in the right at 1.13 with triphasic/biphasic waveforms on the left ABI 1.06 again with triphasic/biphasic waveforms. It would not have been possible to have done [name] toe brachial index because of spasticity. We have been using Sorbac to the left foot alginate to the rest of his wounds on the right foot left lateral calf and left buttock 1/25; arrives in clinic with erythema and swelling of the left forefoot worse over the first MTP area. This extends laterally dorsally and but also posteriorly. Still has an area on the left lateral part of the lower part of his calf wound it is eschared and clearly not closed. ooArea on the left buttock still with surrounding irritation and erythema. ooRight foot surface wound dorsally. The area between the right and first and second toes appears better. 2/1; ooThe left foot wound is about the same. Erythema slightly better I gave him [name] week of doxycycline empirically ooRight foot wound is more extensive extending between the toes to the plantar surface ooLeft lateral calf really no open surface  on the inferior part of his original wound however the entire area still looks vulnerable ooAbsolutely no improvement in the left buttock wound required debridement. 2/8; the left foot is about the same. Erythema is slightly  improved I gave him clindamycin last week. ooRight foot looks better he is using Lotrimin and silver alginate ooHe has [name] breakdown in the left lateral calf. Denuded epithelium which I have removed ooLeft buttock about the same were using Hydrofera Blue 2/15; left foot is about the same there is less surrounding erythema. Surface still has tightly adherent debris which I have debriding however not making any progress ooRight foot has [name] substantial wound on the medial right second toe between the first and second webspace. ooStill an open area on the left lateral calf distal area. ooButtock wound is about the same 2/22; left foot is about the same less surrounding erythema. Surface has adherent debris. Polymen Ag Right foot area significant wound between the first and second toes. We have been using silver alginate here Left lateral leg polymen Ag at the base of his original venous insufficiency wound ooLeft buttock some improvement here 3/1; ooRight foot is deteriorating in the first second toe webspace. Larger and more substantial. We have been using silver alginate. ooLeft dorsal foot about the same markedly adherent surface debris using PolyMem Ag ooLeft lateral calf surface debris using PolyMem AG ooLeft buttock is improved again using PolyMem Ag. ooHe is completing his terbinafine. The erythema in the foot seems better. He has been on this for 2 [name] 3/8; no improvement in any wound area in fact he has [name] small open area on the dorsal midfoot which is new this week. He has not gotten his foot x-rays yet 3/15; his x-rays were both negative for osteomyelitis of both feet. No major change in any of his wounds on the extremities however his buttock wounds are better. We have been using polymen on the buttocks, left lower leg. Iodoflex on the left foot and silver alginate on the right 3/22; arrives in clinic today with the 2 major issues are the improvement in the left dorsal foot  wound which for once actually looks healthy with [name] nice healthy wound surface without debridement. Using Iodoflex here. Unfortunately on the left lateral calf which is in the distal part of his original wound he came to the clinic here for there was purulent drainage noted some increased breakdown scattered around the original area and [name] small area proximally. We we are using polymen here will change to silver alginate today. His buttock wound on the left is better and I think the area on the right first second toe webspace is also improved 3/29; left dorsal foot looks better. Using Iodoflex. Left ankle culture from deterioration last time grew E. coli, Enterobacter and Enterococcus. I will give him [name] course of cefdinir although that will not cover Enterococcus. The area on the right foot in the webspace of the first and second toe lateral first toe looks better. The area on his buttock is about healed Vascular appointment is on April 21. This is to look at his venous system vis--vis continued breakdown of the wounds on the left including the left lateral leg and left dorsal foot he. He has had previous ablations on this side 4/5; the area between the right first and second toes lateral aspect of the first toe looks better. Dorsal aspect of the left first toe on the left foot also improved. Unfortunately the left lateral lower leg  is larger and there is [name] second satellite wound superiorly. The usual superficial abrasions on the left buttock overall better but certainly not closed 4/12; the area between the right first and second toes is improved. Dorsal aspect of the left foot also slightly smaller with [name] vibrant healthy looking surface. No real change in the left lateral leg and the left buttock wound is healed He has an unaffordable co-pay for Apligraf. Appointment with vein and vascular with regards to the left leg venous part of the circulation is on 4/21 4/19; we continue to see improvement in  all wound areas. Although this is minor. He has his vascular appointment on 4/21. The area on the left buttock has not reopened although right in the center of this area the skin looks somewhat threatened 4/26; the left buttock is unfortunately reopened. In general his left dorsal foot has [name] healthy surface and looks somewhat smaller although it was not measured as such. The area between his first and second toe webspace on the right as [name] small wound against the first toe. The patient saw vascular surgery. The real question I was asking was about the small saphenous vein on the left. He has previously ablated left greater saphenous vein. Nothing further was commented on on the left. Right greater saphenous vein without reflux at the saphenofemoral junction or proximal thigh there was no indication for ablation of the right greater saphenous vein duplex was negative for DVT bilaterally. They did not think there was anything from [name] point of view that could be offered. They ABIs within normal limits 5/3; only small open area on the left buttock. The area on the left lateral leg which was his original venous reflux is now 2 wounds both which look clean. We are using Iodoflex on the left dorsal foot which looks healthy and smaller. He is down to [name] very tiny area between the right first and second toes, using silver alginate 5/10; all of his wounds appear better. We have much better edema control in 4 layer compression on the left. This may be the factor that is allowing the left foot and left lateral calf to heal. He has external compression garments at home 04/14/20-All of his wounds are progressing well, the left forefoot is practically closed, left ischium appears to be about the same, right toe webspace is also smaller. The left lateral leg is about the same, continue using Hydrofera Blue to this, silver alginate to the ischium, Iodoflex to the toe space on the right 6/7; most of his  wounds outside of the left buttock are doing well. The area on the left lateral calf and left dorsal foot are smaller. The area on the right foot in between the first and second toe webspace is barely visible although he still says there is some drainage here is the only reason I did not heal this out. ooUnfortunately the area on the left buttock almost looks like he has [name] skin tear from tape. He has open wound and then [name] large flap of skin that we are trying to get adherence over an area just next to the remaining wound 6/21; 2 week follow-up. I believe is been here for nurse visits. Miraculously the area between his first and second toes on the left dorsal foot is closed over. Still open on the right first second web space. The left lateral calf has 2 open areas. Distally this is more superficial. The proximal area had [name] little more depth  and required debridement of adherent necrotic material. His buttock wound is actually larger we have been using silver alginate here 6/28; the patient's area on the left foot remains closed. Still open wet area between the first and second toes on the right and also extending into the plantar aspect. We have been using silver alginate in this location. He has 2 areas on the left lower leg part of his original long wounds which I think are better. We have been using Hydrofera Blue here. Hydrofera Blue to the left buttock which is stable 7/12; left foot remains closed. Left ankle is closed. May be [name] small area between his right first and second toes the only truly open area is on the left buttock. We have been using Hydrofera Blue here 7/19; patient arrives with marked deterioration especially in the left foot and ankle. We did not put him in [name] compression wrap on the left last week in fact he wore his juxta lite stockings on either side although he does not have an underlying stocking. He has [name] reopening on the left dorsal foot, left lateral ankle and [name] new area on  the right dorsal ankle. More worrisome is the degree of erythema on the left foot extending on the lateral foot into the lateral lower leg on the left 7/26; the patient had erythema and drainage from the lateral left ankle last week. Culture of this grew MRSA resistant to doxycycline and clindamycin which are the 2 antibiotics we usually use with this patient who has multiple antibiotic allergies including linezolid, trimethoprim sulfamethoxazole. I had give him an empiric doxycycline and he comes in the area certainly looks somewhat better although it is blotchy in his lower leg. He has not been systemically unwell. He has had areas on the left dorsal foot which is [name] reopening, chronic wounds on the left lateral ankle. Both of these I think are secondary to chronic venous insufficiency. The area between his first and second toes is closed as far as I can tell. He had [name] new wrap injury on the right dorsal ankle last week. Finally he has an area on the left buttock. We have been using silver alginate to everything except the left buttock we are using Hydrofera Blue 06/30/20-Patient returns at 1 week, has been given [name] sample dose pack of [id number] which is [name] tetracycline derivative [omadacycline], patient has completed those, we have been using silver alginate to almost all the wounds except the left ischium where we are using Hydrofera Blue all of them look better 8/16; since I last saw the patient he has been doing well. The area on the left buttock, left lateral ankle and left foot are all closed today. He has completed the [place] I gave him last time and tolerated this well. He still has open areas on the right dorsal ankle and in the right first second toe area which we are using silver alginate. 8/23; we put him in his bilateral external compression stockings last week as he did not have anything open on either leg except for concerning area between the right first and second toe. He comes in today with  an area on the left dorsal foot slightly more proximal than the original wound, the left lateral foot but this is actually [name] continuation of the area he had on the left lateral ankle from last time. As well he is opened up on the left buttock again. 8/30; comes in today with things looking [name] lot better. The  area on the left lower ankle has closed down as has the left foot but with eschar in both areas. The area on the dorsal right ankle is also epithelialized. Very little remaining of the left buttock wound. We have been using silver alginate on all wound areas 9/13; the area in the first second toe webspace on the right has fully epithelialized. He still has some vulnerable epithelium on the right and the ankle and the dorsal foot. He notes weeping. He is using his juxta lite stocking. On the left again the left dorsal foot is closed left lateral ankle is closed. We went to the juxta lite stocking here as well. ooStill vulnerable in the left buttock although only 2 small open areas remain here 9/27; 2-week follow-up. We did not look at his left leg but the patient says everything is closed. He is [name] bit disturbed by the amount of edema in his left foot he is using juxta lite stockings but asking about over the toes stockings which would be 30/40, will talk to him next time. According to him there is no open wound on either the left foot or the left ankle/calf He has an open area on the dorsal right calf which I initially point [name] wrap injury. He has superficial remaining wound on the left ischial tuberosity been using silver alginate although he says this sticks to the wound 10/5; we gave him 2-week follow-up but he called yesterday expressing some concerns about his right foot right ankle and the left buttock. He came in early. There is still no open areas on the left leg and that still in his juxta lite stocking 10/11; he only has 1 small area on the left buttock that remains measuring millimeters 1  mm. Still has the same irritated skin in this area. We recommended zinc oxide when this eventually closes and pressure relief is meticulously is he can do this. He still has an area on the dorsal part of his right first through third toes which is [name] bit irritated and still open and on the dorsal ankle near the crease of the ankle. We have been using silver alginate and using his own stocking. He has nothing open on the left leg or foot 10/25; 2-week follow-up. Not nearly as good on the left buttock as I was hoping. For open areas with 5 looking threatened small. He has the erythematous irritated chronic skin in this area. oo1 area on the right dorsal ankle. He reports this area bleeds easily ooRight dorsal foot just proximal to the base of his toes ooWe have been using silver alginate. 11/8; 2-week follow-up. Left buttock is about the same although I do not think the wounds are in the same location we have been using silver alginate. I have asked him to use zinc oxide on the skin around the wounds. ooHe still has [name] small area on the right dorsal ankle he reports this bleeds easily ooRight dorsal foot just proximal to the base of the toes does not have anything open although the skin is very dry and scaly ooHe has [name] new opening on the nailbed of the left great toe. Nothing on the left ankle Objective Constitutional Sitting or standing Blood Pressure is within target range for patient.. Pulse regular and within target range for patient.Marland Kitchen Respirations regular, non-labored and within target range.. Temperature is normal and within the target range for the patient.Marland Kitchen Appears in no distress. Vitals Time Taken: 7:53 AM, Height: 70 in, Weight: 216  lbs, BMI: 31, Temperature: 98.56 F, Pulse: 132 bpm, Respiratory Rate: 16 breaths/min, Blood Pressure: 120/79 mmHg. Cardiovascular Needle pulses are palpable. Good edema control. General Notes: Wound exam; left buttock as open areas but they are not in  the same location as previously. We have been using silver alginate. ooArea on the right ankle is just above closed. Been using Hydrofera Blue here. No debridement is required ooDorsal right foot thick excoriated dry scaling skin yet I can see no open wounds ooHe had dry skin on the left foot there is an open area in the base of the nailbed of the left great toe nothing is open on the left ankle Integumentary (Hair, Skin) Wound #38R status is Open. Original cause of wound was Gradually Appeared. The wound is located on the Right T - Web between 1st and 2nd. The wound oe measures 1cm length x 0.7cm width x 0.1cm depth; 0.55cm^2 area and 0.055cm^3 volume. There is Fat Layer (Subcutaneous Tissue) exposed. There is no tunneling or undermining noted. There is [name] small amount of serous drainage noted. The wound margin is flat and intact. There is large (67-100%) pink granulation within the wound bed. There is no necrotic tissue within the wound bed. Wound #41R status is Open. Original cause of wound was Gradually Appeared. The wound is located on the Left Ischium. The wound measures 2.4cm length x 4cm width x 0.1cm depth; 7.54cm^2 area and 0.754cm^3 volume. There is Fat Layer (Subcutaneous Tissue) exposed. There is no tunneling or undermining noted. There is [name] medium amount of serosanguineous drainage noted. The wound margin is distinct with the outline attached to the wound base. There is large (67-100%) red, friable granulation within the wound bed. There is no necrotic tissue within the wound bed. Wound #46 status is Open. Original cause of wound was Gradually Appeared. The wound is located on the Right,Dorsal Ankle. The wound measures 0.1cm length x 0.1cm width x 0.1cm depth; 0.008cm^2 area and 0.001cm^3 volume. There is Fat Layer (Subcutaneous Tissue) exposed. There is no tunneling or undermining noted. There is [name] small amount of serosanguineous drainage noted. The wound margin is flat and intact.  There is large (67-100%) red granulation within the wound bed. There is no necrotic tissue within the wound bed. 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 Dressing Change Frequency: Wound #38R Right T - Web between 1st and 2nd: oe Change Dressing every other day. Wound #41R Left Ischium: Change Dressing every other day. Wound #46 Right,Dorsal Ankle: Change Dressing every other day. Skin Barriers/Peri-Wound Care: Moisturizing lotion - both legs daily Other: - lotrimen to periwound of right foot webbing 1st / 2nd toe Wound Cleansing: May shower and wash wound with soap and water. - on days that dressing is changed Primary Wound Dressing: Wound #38R Right T - Web between 1st and 2nd: oe Calcium Alginate with Silver Other: - also apply small piece of alginate on left great toe Wound #41R Left Ischium: Calcium Alginate with Silver Wound #46 Right,Dorsal Ankle: Hydrofera Blue - Ready Secondary Dressing: Wound #38R Right T -  Web between 1st and 2nd: oe Kerlix/Rolled Gauze Dry Gauze Wound #41R Left Ischium: Foam Border - or ABD pad and tape Wound #46 Right,Dorsal Ankle: Foam Border Edema Control: Elevate legs to the level of the heart or above for 30 minutes daily and/or when sitting, [name] frequency of: - throughout the day Support Garment 30-40 mm/Hg pressure to: - Juxtalite to both legs daily Off-Loading: Low air-loss mattress (Group 2) Roho cushion for wheelchair Turn  and reposition every 2 hours - out of wheelchair throughout the day, try to lay on sides, sleep in the bed not the recliner 1. Small pieces silver alginate on the left great toe 2. Continue with Hydrofera Blue to the right dorsal ankle 3. Continue with alginate's that the superficial areas on his buttock. These have changed location not so much inform superficial I do not look unhealthy. Pink erythematous skin here which I think is [name] combination of chronic friction and moisture 4. Follow-up in 3 [name] Electronic Signature(s) Signed: 09/30/2020 3:11:24 PM By: Linton Ham MD Entered By: Linton Ham on 09/29/2020 08:52:15 -------------------------------------------------------------------------------- SuperBill Details Patient Name: Date of Service: [name] [name], [name] [name] 09/29/2020 Medical Record Number: [id number] Patient Account Number: [id number] Date of Birth/Sex: Treating RN: 02/27/1986 (34 y.o. [name] [name] [name] Primary Care Provider: [place], [place] Other Clinician: Referring Provider: Treating Provider/Extender: [name] [name] [name] in Treatment: 247 Diagnosis Coding ICD-10 Codes Code Description I87.332 Chronic venous hypertension (idiopathic) with ulcer and inflammation of left lower extremity L97.321 Non-pressure chronic ulcer of left ankle limited to breakdown of skin L97.311 Non-pressure chronic ulcer of right ankle limited to breakdown of skin L97.511 Non-pressure chronic ulcer of other part of right foot limited to breakdown of skin L97.521 Non-pressure chronic ulcer of other part of left foot limited to breakdown of 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, 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, complete Facility Procedures CPT4 Code: [id number] Description: 99214 - WOUND CARE VISIT-LEV 4 EST PT Modifier: Quantity: 1 Physician Procedures : CPT4 Code Description Modifier [id number] - WC PHYS LEVEL 3 - EST PT ICD-10 Diagnosis Description L97.311 Non-pressure  chronic ulcer of right ankle limited to breakdown of skin L97.511 Non-pressure chronic ulcer of other part of right foot limited  to breakdown of skin L97.521 Non-pressure chronic ulcer of other part of left foot limited to breakdown of 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, stage 3 Quantity: 1 Electronic Signature(s) Signed: 09/29/2020 6:16:40 PM By: Levan Hurst RN, BSN Signed: 09/30/2020 3:11:24 PM By: Linton Ham MD Entered By: Levan Hurst on 09/29/2020 09:09:50

## 2020-09-30 NOTE — Progress Notes (Signed)
[name], [name] ([id number]) Visit Report for 09/29/2020 Arrival Information Details Patient Name: Date of Service: [name], [name] 09/29/2020 7:30 [name] Medical Record Number: [id number] Patient Account Number: [id number] Date of Birth/Sex: Treating RN: Mar 28, 1986 (34 y.o. [name]) Carlene Coria Primary Care Jaylee Freeze: [name], [name] Other Clinician: Referring Constant [place]: Treating Gorden Stthomas/Extender: [name]: 60 Visit Information History Since Last Visit All ordered tests and consults were completed: No Patient Arrived: Wheel Chair Added or deleted any medications: No Arrival Time: 07:53 Any new allergies or adverse reactions: No Accompanied By: self Had [name] fall or experienced change in No Transfer Assistance: None activities of daily living that may affect Patient Identification Verified: Yes risk of falls: Secondary Verification Process Completed: Yes Signs or symptoms of abuse/neglect since last visito No Patient Requires Transmission-Based Precautions: No Hospitalized since last visit: No Patient Has Alerts: Yes Implantable device outside of the clinic excluding No Patient Alerts: R ABI = 1.0 cellular tissue based products placed in the center L ABI = 1.1 since last visit: Has Dressing in Place as Prescribed: Yes Has Compression in Place as Prescribed: Yes Pain Present Now: No Electronic Signature(s) Signed: 09/29/2020 5:51:27 PM By: Carlene Coria RN Entered By: Carlene Coria on 09/29/2020 07:53:47 -------------------------------------------------------------------------------- Clinic Level of Care Assessment Details Patient Name: Date of Service: [name], [name] 09/29/2020 7:30 [name] Medical Record Number: [id number] Patient Account Number: [id number] Date of Birth/Sex: Treating RN: 11-May-1986 (33 y.o. [name] Primary Care Rekita Miotke: [name], [name] Other Clinician: Referring Adie Vilar: Treating Brynnly Bonet/Extender: [name]: 8 Clinic Level of Care Assessment Items TOOL 4 Quantity Score X- 1 0 Use when only an EandM is performed on FOLLOW-UP visit ASSESSMENTS - Nursing Assessment / Reassessment X- 1 10 Reassessment of Co-morbidities (includes updates in patient status) X- 1 5 Reassessment of Adherence to Treatment Plan ASSESSMENTS - Wound and Skin [name] ssessment / Reassessment []  - 0 Simple Wound Assessment / Reassessment - one wound X- 3 5 Complex Wound Assessment / Reassessment - multiple wounds []  - 0 Dermatologic / Skin Assessment (not related to wound area) ASSESSMENTS - Focused Assessment []  - 0 Circumferential Edema Measurements - multi extremities []  - 0 Nutritional Assessment / Counseling / Intervention X- 1 5 Lower Extremity Assessment (monofilament, tuning fork, pulses) []  - 0 Peripheral Arterial Disease Assessment (using hand held doppler) ASSESSMENTS - Ostomy and/or Continence Assessment and Care []  - 0 Incontinence Assessment and Management []  - 0 Ostomy Care Assessment and Management (repouching, etc.) PROCESS - Coordination of Care X - Simple Patient / Family Education for ongoing care 1 15 []  - 0 Complex (extensive) Patient / Family Education for ongoing care X- 1 10 Staff obtains Programmer, systems, Records, T Results / Process Orders est []  - 0 Staff telephones HHA, Nursing Homes / Clarify orders / etc []  - 0 Routine Transfer to another Facility (non-emergent condition) []  - 0 Routine Hospital Admission (non-emergent condition) []  - 0 New Admissions / Biomedical engineer / Ordering NPWT Apligraf, etc. , []  - 0 Emergency Hospital Admission (emergent condition) X- 1 10 Simple Discharge Coordination []  - 0 Complex (extensive) Discharge Coordination PROCESS - Special Needs []  - 0 Pediatric / Minor Patient Management []  - 0 Isolation Patient Management []  - 0 Hearing / Language / Visual special needs []  - 0 Assessment of Community assistance  (transportation, D/C planning, etc.) []  - 0 Additional assistance / Altered mentation []  - 0 Support Surface(s) Assessment (bed, cushion, seat,  etc.) INTERVENTIONS - Wound Cleansing / Measurement []  - 0 Simple Wound Cleansing - one wound X- 3 5 Complex Wound Cleansing - multiple wounds X- 1 5 Wound Imaging (photographs - any number of wounds) []  - 0 Wound Tracing (instead of photographs) []  - 0 Simple Wound Measurement - one wound X- 3 5 Complex Wound Measurement - multiple wounds INTERVENTIONS - Wound Dressings X - Small Wound Dressing one or multiple wounds 3 10 []  - 0 Medium Wound Dressing one or multiple wounds []  - 0 Large Wound Dressing one or multiple wounds []  - 0 Application of Medications - topical []  - 0 Application of Medications - injection INTERVENTIONS - Miscellaneous []  - 0 External ear exam []  - 0 Specimen Collection (cultures, biopsies, blood, body fluids, etc.) []  - 0 Specimen(s) / Culture(s) sent or taken to Lab for analysis []  - 0 Patient Transfer (multiple staff / Civil Service fast streamer / Similar devices) []  - 0 Simple Staple / Suture removal (25 or less) []  - 0 Complex Staple / Suture removal (26 or more) []  - 0 Hypo / Hyperglycemic Management (close monitor of Blood Glucose) []  - 0 Ankle / Brachial Index (ABI) - do not check if billed separately X- 1 5 Vital Signs Has the patient been seen at the hospital within the last three years: Yes Total Score: 140 Level Of Care: New/Established - Level 4 Electronic Signature(s) Signed: 09/29/2020 6:16:40 PM By: Levan Hurst RN, BSN Entered By: Levan Hurst on 09/29/2020 09:09:32 -------------------------------------------------------------------------------- Encounter Discharge Information Details Patient Name: Date of Service: [name], [name] 09/29/2020 7:30 [name] Medical Record Number: [id number] Patient Account Number: [id number] Date of Birth/Sex: Treating RN: 1985/12/07 (34 y.o. [name] Primary Care Alyssa Mancera: [place], [place] Other Clinician: Referring Genaro Bekker: Treating Janika Jedlicka/Extender: [name]: [phone] Encounter Discharge Information Items Discharge Condition: Stable Ambulatory Status: Wheelchair Discharge Destination: Home Transportation: Private Auto Accompanied By: self Schedule Follow-up Appointment: Yes Clinical Summary of Care: Electronic Signature(s) Signed: 09/29/2020 5:46:03 PM By: Deon Pilling Entered By: Deon Pilling on 09/29/2020 08:45:53 -------------------------------------------------------------------------------- Lower Extremity Assessment Details Patient Name: Date of Service: Delay, [name] 09/29/2020 7:30 [name] Medical Record Number: [id number] Patient Account Number: [id number] Date of Birth/Sex: Treating RN: 03-07-1986 (34 y.o. [name] Primary Care Cesario Weidinger: [place], [place] Other Clinician: Referring Axl Rodino: Treating Semaje Kinker/Extender: [name]: 247 Edema Assessment Assessed: [Left: No] [Right: No] Edema: [Left: Ye] [Right: s] Calf Left: Right: Point of Measurement: 33 cm From Medial Instep 34 cm Ankle Left: Right: Point of Measurement: 10 cm From Medial Instep 22 cm Electronic Signature(s) Signed: 09/29/2020 5:51:27 PM By: Carlene Coria RN Entered By: Carlene Coria on 09/29/2020 08:03:44 -------------------------------------------------------------------------------- Multi Wound Chart Details Patient Name: Date of Service: [place], [name] 09/29/2020 7:30 [name] Medical Record Number: [id number] Patient Account Number: [id number] Date of Birth/Sex: Treating RN: 02-27-1986 (34 y.o. [name] Primary Care Elih Mooney: [name], [name] Other Clinician: Referring Novalie Leamy: Treating Evoleht Hovatter/Extender: [name]: 247 Vital Signs Height(in): 70 Pulse(bpm): 132 Weight(lbs): 216 Blood Pressure(mmHg):  120/79 Body Mass Index(BMI): 31 Temperature(F): 98.56 Respiratory Rate(breaths/min): 16 Photos: [38R:No Photos Right T - Web between 1st and 2nd Left Ischium oe] [41R:No Photos] [46:No Photos Right, Dorsal Ankle] Wound Location: [38R:Gradually Appeared] [41R:Gradually Appeared] [46:Gradually Appeared] Wounding Event: [38R:Inflammatory] [41R:Pressure Ulcer] [46:Abrasion] Primary Etiology: [38R:Sleep Apnea, Hypertension, Paraplegia Sleep Apnea, Hypertension, Paraplegia Sleep Apnea, Hypertension, Paraplegia] Comorbid History: [38R:11/30/2019] [41R:03/16/2020] [46:08/13/2020] Date Acquired: [38R:43] [  41R:28] [46:6] Weeks of Treatment: [38R:Open] [41R:Open] [46:Open] Wound Status: [38R:Yes] [41R:Yes] [46:No] Wound Recurrence: [38R:No] [41R:Yes] [46:No] Clustered Wound: [38R:[name]] [41R:3] [46:[name]] Clustered Quantity: [38R:1x0.7x0.1] [41R:2.4x4x0.1] [46:0.1x0.1x0.1] Measurements L x W x D (cm) [38R:0.55] [41R:7.54] [46:0.008] [name] (cm) : rea [38R:0.055] [41R:0.754] [46:0.001] Volume (cm) : [38R:-66.70%] [41R:-317.50%] [46:87.30%] % Reduction in [name] rea: [38R:76.20%] [41R:-316.60%] [46:83.30%] % Reduction in Volume: [38R:Full Thickness Without Exposed] [41R:Category/Stage II] [46:Full Thickness Without Exposed] Classification: [38R:Support Structures Small] [41R:Medium] [46:Support Structures Small] Exudate Amount: [38R:Serous] [41R:Serosanguineous] [46:Serosanguineous] Exudate Type: [38R:amber] [41R:red, brown] [46:red, brown] Exudate Color: [38R:Flat and Intact] [41R:Distinct, outline attached] [46:Flat and Intact] Wound Margin: [38R:Large (67-100%)] [41R:Large (67-100%)] [46:Large (67-100%)] Granulation Amount: [38R:Pink] [41R:Red, Friable] [46:Red] Granulation Quality: [38R:None Present (0%)] [41R:None Present (0%)] [46:None Present (0%)] Necrotic Amount: [38R:Fat Layer (Subcutaneous Tissue): Yes Fat Layer (Subcutaneous Tissue): Yes Fat Layer (Subcutaneous Tissue): Yes] Exposed  Structures: [38R:Fascia: No Tendon: No Muscle: No Joint: No Bone: No Large (67-100%)] [41R:Fascia: No Tendon: No Muscle: No Joint: No Bone: No Small (1-33%)] [46:Fascia: No Tendon: No Muscle: No Joint: No Bone: No Medium (34-66%)] Treatment Notes Wound #38R (Right Toe - Web between 1st and 2nd) 1. Cleanse With Wound Cleanser 3. Primary Dressing Applied Calcium Alginate Ag 4. Secondary Dressing Dry Gauze Roll Gauze 5. 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 Cleanse With Wound Cleanser 3. Primary Dressing Applied Calcium Alginate Ag 4. Secondary Dressing Foam Border Dressing 5. Secured With Self Adhesive Bandage Wound #46 (Right, Dorsal Ankle) 1. Cleanse With Wound Cleanser 3. Primary Dressing Applied Hydrofera Blue 4. Secondary Dressing Foam Border Dressing 5. Secured With Office manager) Signed: 09/29/2020 6:16:40 PM By: Levan Hurst RN, BSN Signed: 09/30/2020 3:11:24 PM By: Linton Ham MD Entered By: Linton Ham on 09/29/2020 79:02:40 -------------------------------------------------------------------------------- Multi-Disciplinary Care Plan Details Patient Name: Date of Service: [place]ver, [name] 09/29/2020 7:30 [name] Medical Record Number: [id number] Patient Account Number: [id number] Date of Birth/Sex: Treating RN: [birth year] (34 y.o. [name] Primary Care Whitley Strycharz: [name], [name] Other Clinician: Referring Toy Eisemann: Treating Fenton Candee/Extender: [name]: 247 Active Inactive Wound/Skin Impairment Nursing Diagnoses: Impaired tissue integrity Knowledge deficit related to ulceration/compromised skin integrity Goals: Patient/caregiver will verbalize understanding of skin care regimen Date Initiated: 01/05/2016 Target Resolution Date: 10/31/2020 Goal Status: Active Ulcer/skin breakdown will have [name] volume reduction of 30% by week 4 Date Initiated:  01/05/2016 Date Inactivated: 12/22/2017 Target Resolution Date: 01/19/2018 Unmet Reason: complex wounds, Goal Status: Unmet infection Interventions: Assess patient/caregiver ability to obtain necessary supplies Assess ulceration(s) every visit Provide education on ulcer and skin care Notes: Electronic Signature(s) Signed: 09/29/2020 6:16:40 PM By: Levan Hurst RN, BSN Entered By: Levan Hurst on 09/29/2020 07:55:21 -------------------------------------------------------------------------------- Pain Assessment Details Patient Name: Date of Service: Meisinger, [name] 09/29/2020 7:30 [name] Medical Record Number: [id number] Patient Account Number: [id number] Date of Birth/Sex: Treating RN: [birth year] (34 y.o. [name] Primary Care Jing Howatt: [place], [place] Other Clinician: Referring Vlasta Baskin: Treating Kaydance Bowie/Extender: [name]: 247 Active Problems Location of Pain Severity and Description of Pain Patient Has Paino No Site Locations Pain Management and Medication Current Pain Management: Electronic Signature(s) Signed: 09/29/2020 5:51:27 PM By: Carlene Coria RN Entered By: Carlene Coria on 09/29/2020 07:54:35 -------------------------------------------------------------------------------- Patient/Caregiver Education Details Patient Name: Date of Service: Mckillop, [name] Viviann Spare. 11/8/2021andnbsp7:30 [name] Medical Record Number: [id number] Patient Account Number: [id number] Date of Birth/Gender: Treating RN: 06/27/1986 (34 y.o. [name] Primary Care Physician: Janine Limbo  Other Clinician: Referring Physician: Treating Physician/Extender: Maryann Alar in Treatment: 247 Education Assessment Education Provided To: Patient Education Topics Provided Wound/Skin Impairment: Methods: Explain/Verbal Responses: State content correctly Electronic Signature(s) Signed: 09/29/2020 6:16:40 PM By: Levan Hurst RN,  BSN Entered By: Levan Hurst on 09/29/2020 07:55:35 -------------------------------------------------------------------------------- Wound Assessment Details Patient Name: Date of Service: [place], [name] 09/29/2020 7:30 [name] Medical Record Number: [id number] Patient Account Number: [id number] Date of Birth/Sex: Treating RN: 1986/03/06 (34 y.o. [name] Primary Care Aaron Bostwick: [name], [name] Other Clinician: Referring Prerana Strayer: Treating Lillyahna Hemberger/Extender: [name]: 247 Wound Status Wound Number: [id number] Primary Etiology: Inflammatory Wound Location: Right T - Web between 1st and 2nd oe Wound Status: Open Wounding Event: Gradually Appeared Comorbid History: Sleep Apnea, Hypertension, Paraplegia Date Acquired: 11/30/2019 Weeks Of Treatment: 43 Clustered Wound: No Wound Measurements Length: (cm) 1 Width: (cm) 0.7 Depth: (cm) 0.1 Area: (cm) 0.55 Volume: (cm) 0.055 % Reduction in Area: -66.7% % Reduction in Volume: 76.2% Epithelialization: Large (67-100%) Tunneling: No Undermining: No Wound Description Classification: Full Thickness Without Exposed Support Structures Wound Margin: Flat and Intact Exudate Amount: Small Exudate Type: Serous Exudate Color: amber Foul Odor After Cleansing: No Slough/Fibrino No Wound Bed Granulation Amount: Large (67-100%) Exposed Structure Granulation Quality: Pink Fascia Exposed: No Necrotic Amount: None Present (0%) Fat Layer (Subcutaneous Tissue) Exposed: Yes Tendon Exposed: No Muscle Exposed: No Joint Exposed: No Bone Exposed: No Treatment Notes Wound #38R (Right Toe - Web between 1st and 2nd) 1. Cleanse With Wound Cleanser 3. Primary Dressing Applied Calcium Alginate Ag 4. Secondary Dressing Dry Gauze Roll Gauze 5. Secured With [place] tape Notes Development worker, international aid) Signed: 09/29/2020 5:51:27 PM By: Carlene Coria RN Entered By: Carlene Coria on 09/29/2020  08:05:31 -------------------------------------------------------------------------------- Wound Assessment Details Patient Name: Date of Service: [place]imming, [name] 09/29/2020 7:30 [name] Medical Record Number: [id number] Patient Account Number: [id number] Date of Birth/Sex: Treating RN: 03-Jul-1986 (34 y.o. [name]) Carlene Coria Primary Care Marne Meline: [name], [name] Other Clinician: Referring Brittainy Bucker: Treating Shalane [name]/Extender: [name]: 247 Wound Status Wound Number: 41R Primary Etiology: Pressure Ulcer Wound Location: Left Ischium Wound Status: Open Wounding Event: Gradually Appeared Comorbid History: Sleep Apnea, Hypertension, Paraplegia Date Acquired: 03/16/2020 Weeks Of Treatment: 28 Clustered Wound: Yes Photos Photo Uploaded By: Mikeal Hawthorne on 09/30/2020 07:36:06 Wound Measurements Length: (cm) 2.4 Width: (cm) 4 Depth: (cm) 0.1 Clustered Quantity: 3 Area: (cm) 7.54 Volume: (cm) 0.754 % Reduction in Area: -317.5% % Reduction in Volume: -316.6% Epithelialization: Small (1-33%) Tunneling: No Undermining: No Wound Description Classification: Category/Stage II Wound Margin: Distinct, outline attached Exudate Amount: Medium Exudate Type: Serosanguineous Exudate Color: red, brown Foul Odor After Cleansing: No Slough/Fibrino No Wound Bed Granulation Amount: Large (67-100%) Exposed Structure Granulation Quality: Red, Friable Fascia Exposed: No Necrotic Amount: None Present (0%) Fat Layer (Subcutaneous Tissue) Exposed: Yes Tendon Exposed: No Muscle Exposed: No Joint Exposed: No Bone Exposed: No Treatment Notes Wound #41R (Left Ischium) 1. Cleanse With Wound Cleanser 3. Primary Dressing Applied Calcium Alginate Ag 4. Secondary Dressing Foam Border Dressing 5. Secured With Office manager) Signed: 09/29/2020 5:51:27 PM By: Carlene Coria RN Entered By: Carlene Coria on 09/29/2020  08:05:55 -------------------------------------------------------------------------------- Wound Assessment Details Patient Name: Date of Service: Val, [name] 09/29/2020 7:30 [name] Medical Record Number: [id number] Patient Account Number: [id number] Date of Birth/Sex: Treating RN: 1986/03/22 (34 y.o. [name]) Carlene Coria Primary Care Kent Riendeau: [name], [name] Other Clinician: Referring Ashey Tramontana: Treating  Velvie Thomaston/Extender: [name]: 247 Wound Status Wound Number: 46 Primary Etiology: Abrasion Wound Location: Right, Dorsal Ankle Wound Status: Open Wounding Event: Gradually Appeared Comorbid History: Sleep Apnea, Hypertension, Paraplegia Date Acquired: 08/13/2020 Weeks Of Treatment: 6 Clustered Wound: No Wound Measurements Length: (cm) 0.1 Width: (cm) 0.1 Depth: (cm) 0.1 Area: (cm) 0.008 Volume: (cm) 0.001 % Reduction in Area: 87.3% % Reduction in Volume: 83.3% Epithelialization: Medium (34-66%) Tunneling: No Undermining: No Wound Description Classification: Full Thickness Without Exposed Support Structures Wound Margin: Flat and Intact Exudate Amount: Small Exudate Type: Serosanguineous Exudate Color: red, brown Foul Odor After Cleansing: No Slough/Fibrino Yes Wound Bed Granulation Amount: Large (67-100%) Exposed Structure Granulation Quality: Red Fascia Exposed: No Necrotic Amount: None Present (0%) Fat Layer (Subcutaneous Tissue) Exposed: Yes Tendon Exposed: No Muscle Exposed: No Joint Exposed: No Bone Exposed: No Treatment Notes Wound #46 (Right, Dorsal Ankle) 1. Cleanse With Wound Cleanser 3. Primary Dressing Applied Hydrofera Blue 4. Secondary Dressing Foam Border Dressing 5. Secured With Office manager) Signed: 09/29/2020 5:51:27 PM By: Carlene Coria RN Entered By: Carlene Coria on 09/29/2020  08:06:14 -------------------------------------------------------------------------------- [place] Details Patient Name: Date of Service: Stills, [name] 09/29/2020 7:30 [name] Medical Record Number: [id number] Patient Account Number: [id number] Date of Birth/Sex: Treating RN: 05-30-1986 (34 y.o. [name]) Carlene Coria Primary Care Sorcha Rotunno: [place], [place] Other Clinician: Referring Maryn Freelove: Treating Link Burgeson/Extender: [name]: 247 Vital Signs Time Taken: 07:53 Temperature (F): 98.56 Height (in): 70 Pulse (bpm): 132 Weight (lbs): 216 Respiratory Rate (breaths/min): 16 Body Mass Index (BMI): 31 Blood Pressure (mmHg): 120/79 Reference Range: 80 - 120 mg / dl Electronic Signature(s) Signed: 09/29/2020 5:51:27 PM By: Carlene Coria RN Entered By: Carlene Coria on 09/29/2020 07:54:22

## 2020-10-20 ENCOUNTER — Other Ambulatory Visit: Payer: Self-pay

## 2020-10-20 ENCOUNTER — Encounter (HOSPITAL_BASED_OUTPATIENT_CLINIC_OR_DEPARTMENT_OTHER): Payer: BC Managed Care – PPO | Admitting: Internal Medicine

## 2020-10-20 DIAGNOSIS — G8221 Paraplegia, complete: Secondary | ICD-10-CM | POA: Diagnosis not present

## 2020-10-20 DIAGNOSIS — L97311 Non-pressure chronic ulcer of right ankle limited to breakdown of skin: Secondary | ICD-10-CM | POA: Diagnosis not present

## 2020-10-20 DIAGNOSIS — L97312 Non-pressure chronic ulcer of right 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: Secondary | ICD-10-CM | POA: Diagnosis not present

## 2020-10-20 DIAGNOSIS — L89323 Pressure ulcer of left buttock, stage 3: Secondary | ICD-10-CM | POA: Diagnosis not present

## 2020-10-20 DIAGNOSIS — L97511 Non-pressure chronic ulcer of other part of right foot limited to breakdown of skin: Secondary | ICD-10-CM | POA: Diagnosis not present

## 2020-10-20 DIAGNOSIS — L97321 Non-pressure chronic ulcer of left ankle limited to breakdown of skin: Secondary | ICD-10-CM | POA: Diagnosis not present

## 2020-10-20 DIAGNOSIS — L89224 Pressure ulcer of left hip, stage 4: Secondary | ICD-10-CM | POA: Diagnosis not present

## 2020-10-20 DIAGNOSIS — I87332 Chronic venous hypertension (idiopathic) with ulcer and inflammation of left lower extremity: Secondary | ICD-10-CM | POA: Diagnosis not present

## 2020-10-20 DIAGNOSIS — L97521 Non-pressure chronic ulcer of other part of left foot limited to breakdown of skin: Secondary | ICD-10-CM | POA: Diagnosis not present

## 2020-10-21 NOTE — Progress Notes (Signed)
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 refractory to multiple dressing attempts. He also has areas in his first and second toes bilaterally in the anterior and posterior web space. I had been using Iodoflex here after a prolonged course of silver alginate with ketoconazole was ineffective [question tinea pedis] 07/14/17; patient arrives today with a very difficult adherent material over his left lateral lower leg wound. He also has surrounding erythema and poorly controlled edema. He was switched his Santyl last visit which the nurses are applying once during his doctor visit and once on a nurse visit. He was also reduced to 2 layer compression I'm not exactly sure of the issue here. 07/21/17; better surface today after 1 week of Iodoflex. Significant cellulitis that we treated last week also better. [Doxycycline] 07/28/17 better surface today with now 2 weeks of Iodoflex. Significant cellulitis treated with doxycycline. He has now completed the doxycycline  and he is back to his usual degree of chronic venous inflammation/stasis dermatitis. He reminds me he has had ablations surgery here 08/04/17; continued improvement with Iodoflex to the left lateral leg wound in terms of the surface of the  wound although the dimensions are better. 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 Problematic areas on the plantar aspect of the first second toe web space on the left and the dorsal aspect of the first second toe web space on the right. At one point I felt these were probably related to chronic fungal infections in treated him aggressively for this although we have not made any improvement here. 08/11/17; left lateral leg. Surface continues to improve with the Iodoflex although we are not seeing much improvement in overall wound dimensions. Areas on his plantar left foot and right foot show no improvement. In fact the right foot looks somewhat worse 08/18/17; left lateral leg. We changed to [facility] Blue last week after a prolonged course of Iodoflex which helps get the surface better. It appears that the wound with is improved. Continue with difficult areas on the left dorsal first second and plantar first second on the right 09/01/17; patient arrives in clinic today having had a temperature of 103 yesterday. He was seen in the ER and [facility]. The patient was concerned he could have cellulitis again in the right leg however they diagnosed him with a UTI and he is now on Keflex. He has a history of cellulitis which is been recurrent and difficult but this is been in the left leg, in the past 5 use doxycycline. He does in and out catheterizations at home which are risk factors for UTI 09/08/17; patient will be completing his Keflex this weekend. The erythema on the left leg is considerably better. He has a new wound today on the medial part of the right leg small superficial almost looks like a skin tear. He has worsening of the area on the right dorsal first and second toe. His major area on the left lateral leg is better. Using Hydrofera Blue on all areas 09/15/17; gradual reduction in width on the long wound in the left  lateral leg. No debridement required. He also has wounds on the plantar aspect of his left first second toe web space and on the dorsal aspect of the right first second toe web space. 09/22/17; there continues to be very gradual improvements in the dimensions of the left lateral leg wound. He hasn't round erythematous spot with might be pressure on his wheelchair. There is no evidence obviously of infection no purulence no warmth He has a dry scaled area on the plantar aspect of the left first second toe Improved area on the dorsal right first second toe. 09/29/17; left lateral leg wound continues to improve in dimensions mostly with an is still a fairly long but increasingly narrow wound. He has a dry scaled area on the plantar aspect of his left first second toe web space Increasingly concerning area on the dorsal right first second toe. In fact I am concerned today about possible cellulitis around this wound. The areas extending up his second toe and although there is deformities here almost appears to abut on the nailbed. 10/06/17; left lateral leg wound continues to make very gradual progress. Tissue culture I did from the right first second toe dorsal foot last time grew MRSA and enterococcus which was vancomycin sensitive. This was not sensitive to  clindamycin or doxycycline. He is allergic to Zyvox and sulfa we have therefore arrange for him to have dalvance infusion tomorrow. He is had this in the past and tolerated it well 10/20/17; left lateral leg wound continues to make decent progress. This is certainly reduced in terms of with there is advancing epithelialization.The cellulitis in the right foot looks better although he still has a deep wound in the dorsal aspect of the first second toe web space. Plantar left first toe web space on the left I think is making some progress 10/27/17; left lateral leg wound continues to make decent progress. Advancing epithelialization.using Hydrofera  Blue The right first second toe web space wound is better-looking using silver alginate Improvement in the left plantar first second toe web space. Again using silver alginate 11/03/17 left lateral leg wound continues to make decent progress albeit slowly. Using [facility] The right per second toe web space continues to be a very problematic looking punched out wound. I obtained a piece of tissue for deep culture I did extensively treated this for fungus. It is difficult to imagine that this is a pressure area as the patient states other than going outside he doesn't really wear shoes at home The left plantar first second toe web space looked fairly senescent. Necrotic edges. This required debridement change to [facility] Blue to all wound areas 11/10/17; left lateral leg wound continues to contract. Using Hydrofera Blue On the right dorsal first second toe web space dorsally. Culture I did of this area last week grew MRSA there is not an easy oral option in this patient was multiple antibiotic allergies or intolerances. This was only a rare culture isolate I'm therefore going to use Bactroban under silver alginate On the left plantar first second toe web space. Debridement is required here. This is also unchanged 11/17/17; left lateral leg wound continues to contract using Hydrofera Blue this is no longer the major issue. The major concern here is the right first second toe web space. He now has an open area going from dorsally to the plantar aspect. There is now wound on the inner lateral part of the first toe. Not a very viable surface on this. There is erythema spreading medially into the forefoot. No major change in the left first second toe plantar wound 11/24/17; left lateral leg wound continues to contract using Hydrofera Blue. Nice improvement today The right first second toe web space all of this looks a lot less angry than last week. I have given him clindamycin and topical Bactroban  for MRSA and terbinafine for the possibility of underlining tinea pedis that I could not control with ketoconazole. Looks somewhat better The area on the plantar left first second toe web space is weeping with dried debris around the wound 12/01/17; left lateral leg wound continues to contract he Hydrofera Blue. It is becoming thinner in terms of with nevertheless it is making good improvement. The right first second toe web space looks less angry but still a large necrotic-looking wounds starting on the plantar aspect of the right foot extending between the toes and now extensively on the base of the right second toe. I gave him clindamycin and topical Bactroban for MRSA anterior benefiting for the possibility of underlying tinea pedis. Not looking better today The area on the left first/second toe looks better. Debrided of necrotic debris 12/05/17* the patient was worked in urgently today because over the weekend he found blood on his incontinence bad when he woke up.  He was found to have an ulcer by his wife who does most of his wound care. He came in today for [place] to look at this. He has not had a history of wounds in his buttocks in spite of his paraplegia. 12/08/17; seen in follow-up today at his usual appointment. He was seen earlier this week and found to have a new wound on his buttock. We also follow him for wounds on the left lateral leg, left first second toe web space and right first second toe web space 12/15/17; we have been using Hydrofera Blue to the left lateral leg which has improved. The right first second toe web space has also improved. Left first second toe web space plantar aspect looks stable. The left buttock has worsened using Santyl. Apparently the buttock has drainage 12/22/17; we have been using Hydrofera Blue to the left lateral leg which continues to improve now 2 small wounds separated by normal skin. He tells [place] he had a fever up to 100 yesterday he is prone to UTIs but  has not noted anything different. He does in and out catheterizations. The area between the first and second toes today does not look good necrotic surface covered with what looks to be purulent drainage and erythema extending into the third toe. I had gotten this to something that I thought look better last time however it is not look good today. He also has a necrotic surface over the buttock wound which is expanded. I thought there might be infection under here so I removed a lot of the surface with a #5 curet though nothing look like it really needed culturing. He is been using Santyl to this area 12/27/17; his original wound on the left lateral leg continues to improve using Hydrofera Blue. I gave him samples of Baxdella although he was unable to take them out of fear for an allergic reaction ["lump in his throat"].the culture I did of the purulent drainage from his second toe last week showed both enterococcus and a set Enterobacter I was also concerned about the erythema on the bottom of his foot although paradoxically although this looks somewhat better today. 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 We've been using silver alginate here. They came up today that he sleeps in a recliner, I'm not sure why but I asked him to stop this 01/03/18; his original wound we've been using Hydrofera Blue is now separated into 2 areas. Ulcer on his left buttock is better he is off the recliner and sleeping in bed Finally both wound areas between his first and second toes also looks some better 01/10/18; his original wound on the left lateral leg is now separated into 2 wounds we've been using Hydrofera Blue Ulcer on his left buttock has some drainage. There is a small probing site going into muscle layer superiorly.using silver alginate -He arrives today with a deep tissue injury on the left heel The wound on the  dorsal aspect of his first second toe on the left looks a lot betterusing silver alginate ketoconazole The area on the first second toe web space on the right also looks a lot bette 01/17/18; his original wound on the left lateral leg continues to progress using Hydrofera Blue Ulcer on his left buttock also is smaller surface healthier except for a small probing site going into the muscle layer superiorly. 2.4 cm of tunneling in this  area DTI on his left heel we have only been offloading. Looks better than last week no threatened open no evidence of infection the wound on the dorsal aspect of the first second toe on the left continues to look like it's regressing we have only been using silver alginate and terbinafine orally The area in the first second toe web space on the right also looks to be a lot better using silver alginate and terbinafine I think this was prompted by tinea pedis 01/31/18; the patient was hospitalized in [place] last week apparently for a complicated UTI. He was discharged on cefepime he does in and out catheterizations. In the hospital he was discovered M I don't mild elevation of AST and ALT and the terbinafine was stopped.predictably the pressure ulcer on s his buttock looks betterusing silver alginate. The area on the left lateral leg also is better using Hydrofera Blue. The area between the first and second toes on the left better. First and second toes on the right still substantial but better. Finally the DTI on the left heel has held together and looks like it's resolving 02/07/18-he is here in follow-up evaluation for multiple ulcerations. He has new injury to the lateral aspect of the last issue a pressure ulcer, he states this is from adhesive removal trauma. He states he has tried multiple adhesive products with no success. All other ulcers appear stable. The left heel DTI is resolving. We will continue with same treatment plan and follow-up next week. 02/14/18;  follow-up for multiple areas. He has a new area last week on the lateral aspect of his pressure ulcer more over the posterior trochanter. The original pressure ulcer looks quite stable has healthy granulation. 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 Almost fully epithelialized on the original superior area using Hydrofera Blue DTI on the left heel has peeled off this week to reveal a small superficial wound under denuded skin and subcutaneous tissue Both areas between the first and second toes look better including nothing open on the left 02/21/18; The patient's wounds on his left ischial tuberosity and posterior left greater trochanter actually looked better. He has a large area of irritation around the area which I think is contact dermatitis. I am doubtful that this is fungal His original wound on the left lateral calf continues to improve we have been using Hydrofera Blue There is no open area in the left first second toe web space although there is a lot of thick callus The DTI on the left heel required debridement today of necrotic surface eschar and subcutaneous tissue using silver alginate Finally the area on the right first second toe webspace continues to contract using silver alginate and ketoconazole 02/28/18 Left ischial tuberosity wounds look better using silver alginate. Original wound on the left calf only has one small open area left using Hydrofera Blue DTI on the left heel required debridement mostly removing skin from around this wound surface. Using silver alginate The areas on the right first/second toe web space using silver alginate and ketoconazole 03/08/18 on evaluation today patient appears to be doing decently well as best I can tell in regard to his wounds. This is the first time that I have seen him as he generally is followed by Dr. Dellia Nims. With that being said  none of his wounds appear to be infected he does have an area where there is some skin  covering what appears to be a new wound on the left dorsal surface of his great toe. This is right at the nail bed. With that being said I do believe that debrided away some of the excess skin can be of benefit in this regard. Otherwise he has been tolerating the dressing changes without complication. 03/14/18; patient arrives today with the multiplicity of wounds that we are following. He has not been systemically unwell Original wound on the left lateral calf now only has 2 small open areas we've been using Hydrofera Blue which should continue The deep tissue injury on the left heel requires debridement today. We've been using silver alginate The left first second toe and the right first second toe are both are reminiscence what I think was tinea pedis. Apparently some of the callus Surface between the toes was removed last week when it started draining. Purulent drainage coming from the wound on the ischial tuberosity on the left. 03/21/18-He is here in follow-up evaluation for multiple wounds. 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 He tolerated debridement. The only change to last week's recommendations is to discontinue antifungal cream between toes. He will follow-up next week 03/28/18; following up for multiple wounds;Concern this week is streaking redness and swelling in the right foot. He is going to need antibiotics for this. 03/31/18; follow-up for right foot cellulitis. Streaking redness and swelling in the right foot on 03/28/18. 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 I put him on clindamycin 300 mg every 6 and brought him in for a quick check. He has an open wound between his first and second toes on the right foot as a potential source. 04/04/18; Right foot cellulitis is resolving he is completing  clindamycin. This is truly good news Left lateral calf wound which is initial wound only has one small open area inferiorly this is close to healing out. He has compression stockings. We will use Hydrofera Blue right down to the epithelialization of this Nonviable surface on the left heel which was initially pressure with a DTI. We've been using Hydrofera Blue. I'm going to switch this back to silver alginate Left first second toe/tinea pedis this looks better using silver alginate Right first second toe tinea pedis using silver alginate Large pressure ulcers on theLeft ischial tuberosity. Small wound here Looks better. I am uncertain about the surface over the large wound. Using silver alginate 04/11/18; Cellulitis in the right foot is resolved Left lateral calf wound which was his original wounds still has 2 tiny open areas remaining this is just about closed Nonviable surface on the left heel is better but still requires debridement Left first second toe/tinea pedis still open using silver alginate Right first second toe wound tinea pedis I asked him to go back to using ketoconazole and silver alginate Large pressure ulcers on the left ischial tuberosity this shear injury here is resolved. Wound is smaller. No evidence of infection using silver alginate 04/18/18; Patient arrives with an intense area of cellulitis in the right mid lower calf extending into the right heel area. Bright red and warm. Smaller area on the left anterior leg. He has a significant history of MRSA. He will definitely need antibioticsdoxycycline He now has 2 open areas on the left ischial tuberosity the original large wound and now a satellite area which I think was above his initial satellite areas. Not a wonderful surface on this satellite area surrounding erythema  which looks like pressure related. His left lateral calf wound again his original wound is just about closed Left heel pressure injury still requiring  debridement Left first second toe looks a lot better using silver alginate Right first second toe also using silver alginate and ketoconazole cream also looks better 04/20/18; the patient was worked in early today out of concerns with his cellulitis on the right leg. I had started him on doxycycline. This was 2 days ago. His wife was concerned about the swelling in the area. Also concerned about the left buttock. He has not been systemically unwell no fever chills. No nausea vomiting or diarrhea 04/25/18; the patient's left buttock wound is continued to deteriorate he is using Hydrofera Blue. He is still completing clindamycin for the cellulitis on the right leg although all of this looks better. 05/02/18 Left buttock wound still with a lot of drainage and a very tightly adherent fibrinous necrotic surface. He has a deeper area superiorly The left lateral calf wound is still closed DTI wound on the left heel necrotic surface especially the circumference using Iodoflex Areas between his left first second toe and right first second toe both look better. Dorsally and the right first second toe he had a necrotic surface although at smaller. In using silver alginate and ketoconazole. I did a culture last week which was a deep tissue culture of the reminiscence of the open wound on the right first second toe dorsally. This grew a few Acinetobacter and a few methicillin-resistant staph aureus. Nevertheless the area actually this week looked better. I didn't feel the need to specifically address this at least in terms of systemic antibiotics. 05/09/18; wounds are measuring larger more drainage per our intake. We are using Santyl covered with alginate on the large superficial buttock wounds, Iodosorb on the left heel, ketoconazole and silver alginate to the dorsal first and second toes bilaterally. 05/16/18; The area on his left buttock better in some aspects although the area superiorly over the ischial  tuberosity required an extensive debridement.using Santyl Left heel appears stable. Using Iodoflex The areas between his first and second toes are not bad however there is spreading erythema up the dorsal aspect of his left foot this looks like cellulitis again. He is insensate the erythema is really very brilliant.o Erysipelas He went to see an allergist days ago because he was itching part of this he had lab work done. This showed a white count of 15.1 with 70% neutrophils. Hemoglobin of 11.4 and a platelet count of 659,000. Last white count we had in Epic was a 2-1/2 years ago which was 25.9 but he was ill at the time. He was able to show me some lab work that was done by his primary physician the pattern is about the same. 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 But prompted me to go ahead and do x-rays of both feet and the pelvis rule out osteomyelitis. He also had a comprehensive metabolic panel this was reasonably normal his albumin was 3.7 liver function tests BUN/creatinine all normal 05/23/18; x-rays of both his feet from last week were negative for underlying pulmonary abnormality. The x-ray of his pelvis however showed mild irregularity in the left ischial which may represent some early osteomyelitis. The wound in the left ischial continues to get deeper clearly now exposed muscle. Each week necrotic surface material over this area. Whereas the rest of the wounds do not look so bad.  The left ischial wound we have been using Santyl and calcium alginate T the left heel surface necrotic debris using Iodoflex o The left lateral leg is still healed Areas on the left dorsal foot and the right dorsal foot are about the same. There is some inflammation on the left which might represent contact dermatitis, fungal dermatitis I am doubtful cellulitis although this looks better than last week 05/30/18; CT scan done at Hospital did not show any  osteomyelitis or abscess. Suggested the possibility of underlying cellulitis although I don't see a lot of evidence of this at the bedside The wound itself on the left buttock/upper thigh actually looks somewhat better. No debridement Left heel also looks better no debridement continue Iodoflex Both dorsal first second toe spaces appear better using Lotrisone. Left still required debridement 06/06/18; Intake reported some purulent looking drainage from the left gluteal wound. Using Santyl and calcium alginate Left heel looks better although still a nonviable surface requiring debridement The left dorsal foot first/second webspace actually expanding and somewhat deeper. I may consider doing a shave biopsy of this area Right dorsal foot first/second webspace appears stable to improved. Using Lotrisone and silver alginate to both these areas 06/13/18 Left gluteal surface looks better. Now separated in the 2 wounds. No debridement required. Still drainage. We'll continue silver alginate Left heel continues to look better with Iodoflex continue this for at least another week Of his dorsal foot wounds the area on the left still has some depth although it looks better than last week. We've been using Lotrisone and silver alginate 06/20/18 Left gluteal continues to look better healthy tissue Left heel continues to look better healthy granulation wound is smaller. He is using Iodoflex and his long as this continues continue the Iodoflex Dorsal right foot looks better unfortunately dorsal left foot does not. There is swelling and erythema of his forefoot. He had minor trauma to this several days ago but doesn't think this was enough to have caused any tissue injury. Foot looks like cellulitis, we have had this problem before 06/27/18 on evaluation today patient appears to be doing a little worse in regard to his foot ulcer. 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 Both of which would really be his only options for treating this infection. In the past he has been given and effusion of Orbactiv. This is done very well for him in the past again it's one time dosing IV antibiotic therapy. Subsequently I do believe this is something we're gonna need to see about doing at this point in time. Currently his other wounds seem to be doing somewhat better in my pinion I'm pretty happy in that regard. 07/03/18 on evaluation today patient's wounds actually appear to be doing fairly well. He has been tolerating the dressing changes without complication. All in all he seems to be showing signs of improvement. In regard to the antibiotics he has been dealing with infectious disease since I saw him last week as far as getting this scheduled. In the end [facility]'s [facility] this coming Friday. In the meantime he has been continuing to perform the dressing changes in such as previous. There does not appear to be any evidence of infection worsengin at this time. 07/10/18; Since I last saw this man 2 weeks  ago things have actually improved. IV antibiotics of resulted in less forefoot erythema although there is still some present. He is not systemically unwell Left buttock wounds 2 now have no depth there is increased epithelialization Using silver alginate Left heel still requires debridement using Iodoflex Left dorsal foot still with a sizable wound about the size of a border but healthy granulation Right dorsal foot still with a slitlike area using silver alginate 07/18/18; the patient's cellulitis in the left foot is improved in fact I think it is on its way to resolving. Left buttock wounds 2 both look better although the larger one has hypertension granulation we've been using silver alginate Left heel has some thick  circumferential redundant skin over the wound edge which will need to be removed today we've been using Iodoflex Left dorsal foot is still a sizable wound required debridement using silver alginate The right dorsal foot is just about closed only a small open area remains here 07/25/18; left foot cellulitis is resolved Left buttock wounds 2 both look better. Hyper-granulation on the major area Left heel as some debris over the surface but otherwise looks a healthier wound. Using silver collagen Right dorsal foot is just about closed 07/31/18; arrives with our intake nurse worried about purulent drainage from the buttock. We had hyper-granulation here last week His buttock wounds 2 continue to look better Left heel some debris over the surface but measuring smaller. Right dorsal foot unfortunately has openings between the toes Left foot superficial wound looks less aggravated. 08/07/18 Buttock wounds continue to look better although some of her granulation and the larger medial wound. silver alginate Left heel continues to look a lot better.silver collagen Left foot superficial wound looks less stable. Requires debridement. He has a new wound superficial area on the foot on the lateral dorsal foot. Right foot looks better using silver alginate without Lotrisone 08/14/2018; patient was in the ER last week diagnosed with a UTI. He is now on Cefpodoxime and Macrodantin. Buttock wounds continued to be smaller. Using silver alginate Left heel continues to look better using silver collagen Left foot superficial wound looks as though it is improving Right dorsal foot area is just about healed. 08/21/2018; patient is completed his antibiotics for his UTI. He has 2 open areas on the buttocks. There is still not closed although the surface looks satisfactory. Using silver alginate Left heel continues to improve using silver collagen The bilateral dorsal foot areas which are at the base of his first and second  toes/possible tinea pedis are actually stable on the left but worse on the right. The area on the left required debridement of necrotic surface. After debridement I obtained a specimen for PCR culture. The right dorsal foot which is been just about healed last week is now reopened 08/28/2018; culture done on the left dorsal foot showed coag negative staph both staph epidermidis and Lugdunensis. I think this is worthwhile initiating systemic treatment. I will use doxycycline given his long list of allergies. The area on the left heel slightly improved but still requiring debridement. The large wound on the buttock is just about closed whereas the smaller one is larger. Using silver alginate in this area 09/04/2018; patient is completing his doxycycline for the left foot although this continues to be a very difficult wound area with very adherent necrotic debris. We are using silver alginate to all his wounds right foot left foot and the small wounds on his buttock, silver collagen on the left heel. 09/11/2018;  once again this patient has intense erythema and swelling of the left forefoot. Lesser degrees of erythema in the right foot. He has a long list of allergies and intolerances. I will reinstitute doxycycline. 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 Using silver alginate Left heel also looks better using silver collagen Unfortunately both the areas on his feet look worse. The area on the left first second webspace is now gone through to the plantar part of his foot. The area on the left foot anteriorly is irritated with erythema and swelling in the forefoot. 09/25/2018 His wound on the left plantar heel looks better. Using silver collagen The area on the left buttock 2 small remnant areas. One is closed one is still open. Using silver alginate The areas between both his first and second toes look worse. This in spite of long-standing antifungal  therapy with ketoconazole and silver alginate which should have antifungal activity He has small areas around his original wound on the left calf one is on the bottom of the original scar tissue and one superiorly both of these are small and superficial but again given wound history in this site this is worrisome 10/02/2018 Left plantar heel continues to gradually contract using silver collagen Left buttock wound is unchanged using silver alginate The areas on his dorsal feet between his first and second toes bilaterally look about the same. I prescribed clindamycin ointment to see if we can address chronic staph colonization and also the underlying possibility of erythrasma The left lateral lower extremity wound is actually on the lateral part of his ankle. Small open area here. We have been using silver alginate 10/09/2018; Left plantar heel continues to look healthy and contract. No debridement is required Left buttock slightly smaller with a tape injury wound just below which was new this week Dorsal feet somewhat improved I have been using clindamycin Left lateral looks lower extremity the actual open area looks worse although a lot of this is epithelialized. I am going to change to silver collagen today He has a lot more swelling in the right leg although this is not pitting not red and not particularly warm there is a lot of spasm in the right leg usually indicative of people with paralysis of some underlying discomfort. We have reviewed his vascular status from 2017 he had a left greater saphenous vein ablation. I wonder about referring him back to vascular surgery if the area on the left leg continues to deteriorate. 10/16/2018 in today for follow-up and management of multiple lower extremity ulcers. His left Buttock wound is much lower smaller and almost closed completely. The wound to the left ankle has began to reopen with Epithelialization and some adherent slough. He has multiple  new areas to the left foot and leg. The left dorsal foot without much improvement. Wound present between left great webspace and 2nd toe. Erythema and edema present right leg. Right LE ultrasound obtained on 10/10/18 was negative for DVT . 10/23/2018; Left buttock is closed over. Still dry macerated skin but there is no open wound. I suspect this is chronic pressure/moisture Left lateral calf is quite a bit worse than when I saw this last. There is clearly drainage here he has macerated skin into the left plantar heel. We will change the primary dressing to alginate Left dorsal foot has some improvement in overall wound area. Still using clindamycin and silver alginate Right dorsal foot about the same  as the left using clindamycin and silver alginate The erythema in the right leg has resolved. He is DVT rule out was negative Left heel pressure area required debridement although the wound is smaller and the surface is health 10/26/2018 The patient came back in for his nurse check today predominantly because of the drainage coming out of the left lateral leg with a recent reopening of his original wound on the left lateral calf. He comes in today with a large amount of surrounding erythema around the wound extending from the calf into the ankle and even in the area on the dorsal foot. He is not systemically unwell. He is not febrile. Nevertheless this looks like cellulitis. We have been using silver alginate to the area. I changed him to a regular visit and I am going to prescribe him doxycycline. The rationale here is a long list of medication intolerances and a history of MRSA. I did not see anything that I thought would provide a valuable culture 10/30/2018 Follow-up from his appointment 4 days ago with really an extensive area of cellulitis in the left calf left lateral ankle and left dorsal foot. I put him on doxycycline. He has a long list of medication allergies which are true allergy reactions.  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 In any case he is a lot better today. The erythema especially in the anterior and lateral left calf is better. He still has left ankle erythema. He also is complaining about increasing edema in the right leg we have only been using Kerlix Coban and he has been doing the wraps at home. Finally he has a spotty rash on the medial part of his upper left calf which looks like folliculitis or perhaps wrap occlusion type injury. Small superficial macules not pustules 11/06/18 patient arrives today with again a considerable degree of erythema around the wound on the left lateral calf extending into the dorsal ankle and dorsal foot. This is a lot worse than when I saw this last week. He is on doxycycline really with not a lot of improvement. He has not been systemically unwell Wounds on the; left heel actually looks improved. Original area on the left foot and proximity to the first and second toes looks about the same. He has superficial areas on the dorsal foot, anterior calf and then the reopening of his original wound on the left lateral calf which looks about the same The only area he has on the right is the dorsal webspace first and second which is smaller. He has a large area of dry erythematous skin on the left buttock small open area here. 11/13/2018; the patient arrives in much better condition. The erythema around the wound on the left lateral calf is a lot better. Not sure whether this was the clindamycin or the TCA and ketoconazole or just in the improvement in edema control [stasis dermatitis]. In any case this is a lot better. The area on the left heel is very small and just about resolved using silver collagen we have been using silver alginate to the areas on his dorsal feet 11/20/2018; his wounds include the left lateral calf, left heel, dorsal aspects of both feet just proximal to  the first second webspace. He is stable to slightly improved. I did not think any changes to his dressings were going to be necessary 11/27/2018 he has a reopening on the left buttock which is surrounded by  what looks like tinea or perhaps some other form of dermatitis. The area on the left dorsal foot has some erythema around it I have marked this area but I am not sure whether this is cellulitis or not. Left heel is not closed. Left calf the reopening is really slightly longer and probably worse 1/13; in general things look better and smaller except for the left dorsal foot. Area on the left heel is just about closed, left buttock looks better only a small wound remains in the skin looks better [using Lotrisone] 1/20; the area on the left heel only has a few remaining open areas here. Left lateral calf about the same in terms of size, left dorsal foot slightly larger right lateral foot still not closed. The area on the left buttock has no open wound and the surrounding skin looks a lot better 1/27; the area on the left heel is closed. Left lateral calf better but still requiring extensive debridements. The area on his left buttock is closed. He still has the open areas on the left dorsal foot which is slightly smaller in the right foot which is slightly expanded. We have been using Iodoflex on these areas as well 2/3; left heel is closed. Left lateral calf still requiring debridement using Iodoflex there is no open area on his left buttock however he has dry scaly skin over a large area of this. Not really responding well to the Lotrisone. Finally the areas on his dorsal feet at the level of the first second webspace are slightly smaller on the right and about the same on the left. Both of these vigorously debrided with Anasept and gauze 2/10; left heel remains closed he has dry erythematous skin over the left buttock but there is no open wound here. Left lateral leg has come in and with.  Still requiring debridement we have been using Iodoflex here. Finally the area on the left dorsal foot and right dorsal foot are really about the same extremely dry callused fissured areas. He does not yet have a dermatology appointment 2/17; left heel remains closed. He has a new open area on the left buttock. The area on the left lateral calf is bigger longer and still covered in necrotic debris. No major change in his foot areas bilaterally. I am awaiting for a dermatologist to look on this. We have been using ketoconazole I do not know that this is been doing any good at all. 2/24; left heel remains closed. The left buttock wound that was new reopening last week looks better. The left lateral calf appears better also although still requires debridement. The major area on his foot is the left first second also requiring debridement. We have been putting Prisma on all wounds. I do not believe that the ketoconazole has done too much good for his feet. He will use Lotrisone I am going to give him a 2-week course of terbinafine. We still do not have a dermatology appointment 3/2 left heel remains closed however there is skin over bone in this area I pointed this out to him today. The left buttock wound is epithelialized but still does not look completely stable. The area on the left leg required debridement were using silver collagen here. With regards to his feet we changed to Lotrisone last week and silver alginate. 3/9; left heel remains closed. Left buttock remains closed. The area on the right foot is essentially closed. The left foot remains unchanged. Slightly smaller on the left lateral calf.  Using silver collagen to both of these areas 3/16-Left heel remains closed. Area on right foot is closed. Left lateral calf above the lateral malleolus open wound requiring debridement with easy bleeding. Left dorsal wound proximal to first toe also debrided. Left ischial area open new. Patient has been  using Prisma with wrapping every 3 days. Dermatology appointment is apparently tomorrow.Patient has completed his terbinafine 2-week course with some apparent improvement according to him, there is still flaking and dry skin in his foot on the left 3/23; area on the right foot is reopened. The area on the left anterior foot is about the same still a very necrotic adherent surface. He still has the area on the left leg and reopening is on the left buttock. He apparently saw dermatology although I do not have a note. According to the patient who is usually fairly well informed they did not have any good ideas. Put him on oral terbinafine which she is been on before. 3/30; using silver collagen to all wounds. Apparently his dermatologist put him on doxycycline and rifampin presumably some culture grew staph. I do not have this result. He remains on terbinafine although I have used terbinafine on him before 4/6; patient has had a fairly substantial reopening on the right foot between the first and second toes. He is finished his terbinafine and I believe is on doxycycline and rifampin still as prescribed by dermatology. We have been using silver collagen to all his wounds although the patient reports that he thinks silver alginate does better on the wounds on his buttock. 4/13; the area on his left lateral calf about the same size but it did not require debridement. Left dorsal foot just proximal to the webspace between the first and second toes is about the same. Still nonviable surface. I note some superficial bronze discoloration of the dorsal part of his foot Right dorsal foot just proximal to the first and second toes also looks about the same. I still think there may be the same discoloration I noted above on the left Left buttock wound looks about the same 4/20; left lateral calf appears to be gradually contracting using silver collagen. He remains on erythromycin empiric treatment for possible  erythrasma involving his digital spaces. The left dorsal foot wound is debrided of tightly adherent necrotic debris and really cleans up quite nicely. The right area is worse with expansion. I did not debride this it is now over the base of the second toe The area on his left buttock is smaller no debridement is required using silver collagen 5/4; left calf continues to make good progress. He arrives with erythema around the wounds on his dorsal foot which even extends to the plantar aspect. Very concerning for coexistent infection. He is finished the erythromycin I gave him for possible erythrasma this does not seem to have helped. The area on the left foot is about the same base of the dorsal toes Is area on the buttock looks improved on the left 5/11; left calf and left buttock continued to make good progress. Left foot is about the same to slightly improved. Major problem is on the right foot. He has not had an x-ray. Deep tissue culture I did last week showed both Enterobacter and E. coli. I did not change the doxycycline I put him on empirically although neither 1 of these were plated to doxycycline. He arrives today with the erythema looking worse on both the dorsal and plantar foot. Macerated skin on the  bottom of the foot. he has not been systemically unwell 5/18-Patient returns at 1 week, left calf wound appears to be making some progress, left buttock wound appears slightly worse than last time, left foot wound looks slightly better, right foot redness is marginally better. X-ray of both feet show no air or evidence of osteomyelitis. Patient is finished his Omnicef and terbinafine. He continues to have macerated skin on the bottom of the left foot as well as right 5/26; left calf wound is better, left buttock wound appears to have multiple small superficial open areas with surrounding macerated skin. X-rays that I did last time showed no evidence of osteomyelitis in either foot. He is  finished cefdinir and doxycycline. I do not think that he was on terbinafine. He continues to have a large superficial open area on the right foot anterior dorsal and slightly between the first and second toes. I did send him to dermatology 2 months ago or so wondering about whether they would do a fungal scraping. I do not believe they did but did do a culture. We have been using silver alginate to the toe areas, he has been using antifungals at home topically either ketoconazole or Lotrisone. We are using silver collagen on the left foot, silver alginate on the right, silver collagen on the left lateral leg and silver alginate on the left buttock 6/1; left buttock area is healed. We have the left dorsal foot, left lateral leg and right dorsal foot. We are using silver alginate to the areas on both feet and silver collagen to the area on his left lateral calf 6/8; the left buttock apparently reopened late last week. He is not really sure how this happened. He is tolerating the terbinafine. Using silver alginate to all wounds 6/15; left buttock wound is larger than last week but still superficial. Came in the clinic today with a report of purulence from the left lateral leg I did not identify any infection Both areas on his dorsal feet appear to be better. He is tolerating the terbinafine. Using silver alginate to all wounds 6/22; left buttock is about the same this week, left calf quite a bit better. His left foot is about the same however he comes in with erythema and warmth in the right forefoot once again. Culture that I gave him in the beginning of May showed Enterobacter and E. coli. I gave him doxycycline and things seem to improve although neither 1 of these organisms was specifically plated. 6/29; left buttock is larger and dry this week. Left lateral calf looks to me to be improved. Left dorsal foot also somewhat improved right foot completely unchanged. The erythema on the right foot is  still present. He is completing the Ceftin dinner that I gave him empirically [see discussion above.) 7/6 - All wounds look to be stable and perhaps improved, the left buttock wound is slightly smaller, per patient bleeds easily, completed ceftin, the right foot redness is less, he is on terbinafine 7/13; left buttock wound about the same perhaps slightly narrower. Area on the left lateral leg continues to narrow. Left dorsal foot slightly smaller right foot about the same. We are using silver alginate on the right foot and Hydrofera Blue to the areas on the left. Unna boot on the left 2 layer compression on the right 7/20; left buttock wound absolutely the same. Area on lateral leg continues to get better. Left dorsal foot require debridement as did the right no major change in the  7/27; left buttock wound the same size necrotic debris over the surface. The area on the lateral leg is closed once again. His left foot looks better right foot about the same although there is some involvement now of the posterior first second toe area. He is still on terbinafine which I have given him for a month, not certain a centimeter major change 06/25/19-All wounds appear to be slightly improved according to report, left buttock wound looks clean, both foot wounds have minimal to no debris the right dorsal foot has minimal slough. We are using Hydrofera Blue to the left and silver alginate to the right foot and ischial wound. 8/10-Wounds all appear to be around the same, the right forefoot distal part has some redness which was not there before, however the wound looks clean and small. Ischial wound looks about the same with no changes 8/17; his wound on the left lateral calf which was his original chronic venous insufficiency wound remains closed. Since I last saw him the areas on the left dorsal foot right dorsal foot generally appear better but require debridement. The area on his left initial tuberosity appears  somewhat larger to me perhaps hyper granulated and bleeds very easily. We have been using Hydrofera Blue to the left dorsal foot and silver alginate to everything else 8/24; left lateral calf remains closed. The areas on his dorsal feet on the webspace of the first and second toes bilaterally both look better. The area on the left buttock which is the pressure ulcer stage II slightly smaller. I change the dressing to Hydrofera Blue to all areas 8/31; left lateral calf remains closed. The area on his dorsal feet bilaterally look better. Using Hydrofera Blue. Still requiring debridement on the left foot. No change in the left buttock pressure ulcers however 9/14; left lateral calf remains closed. Dorsal feet look quite a bit better than 2 weeks ago. Flaking dry skin also a lot better with the ammonium lactate I gave him 2 weeks ago. The area on the left buttock is improved. He states that his Roho cushion developed a leak and he is getting a new one, in the interim he is offloading this vigorously 9/21; left calf remains closed. Left heel which was a possible DTI looks better this week. He had macerated tissue around the left dorsal foot right foot looks satisfactory and improved left buttock wound. I changed his dressings to his feet to silver alginate bilaterally. Continuing Hydrofera Blue on the left buttock. 9/28 left calf remains closed. Left heel did not develop anything [possible DTI] dry flaking skin on the left dorsal foot. Right foot looks satisfactory. Improved left buttock wound. We are using silver alginate on his feet Hydrofera Blue on the buttock. I have asked him to go back to the Lotrisone on his feet including the wounds and surrounding areas 10/5; left calf remains closed. The areas on the left and right feet about the same. A lot of this is epithelialized however debris over the remaining open areas. He is using Lotrisone and silver alginate. The area on the left buttock using  Hydrofera Blue 10/26. Patient has been out for 3 weeks secondary to Covid concerns. He tested negative but I think his wife tested positive. He comes in today with the left foot substantially worse, right foot about the same. Even more concerning he states that the area on his left buttock closed over but then reopened and is considerably deeper in one aspect than it was before [place]  III wound] 11/2; left foot really about the same as last week. Quarter sized wound on the dorsal foot just proximal to the first second toes. Surrounding erythema with areas of denuded epithelium. This is not really much different looking. Did not look like cellulitis this time however. Right foot area about the same.. We have been using silver alginate alginate on his toes Left buttock still substantial irritated skin around the wound which I think looks somewhat better. We have been using Hydrofera Blue here. 11/9; left foot larger than last week and a very necrotic surface. Right foot I think is about the same perhaps slightly smaller. Debris around the circumference also addressed. Unfortunately on the left buttock there is been a decline. Satellite lesions below the major wound distally and now a an additional one posteriorly we have been using Hydrofera Blue but I think this is a pressure issue 11/16; left foot ulcer dorsally again a very adherent necrotic surface. Right foot is about the same. Not much change in the pressure ulcer on his left buttock. 11/30; left foot ulcer dorsally basically the same as when I saw him 2 weeks ago. Very adherent fibrinous debris on the wound surface. Patient reports a lot of drainage as well. 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 We have been using Iodoflex, patient changed back to alginate because of the drainage. Area on his right dorsal foot really looks benign with a healthier surface certainly a lot better than on the left.  Left buttock wounds all improved using Hydrofera Blue 12/7; left dorsal foot again no improvement. Tightly adherent debris. PCR culture I did last week only showed likely skin contaminant. I have gone ahead and done a punch biopsy of this which is about the last thing in terms of investigations I can think to do. He has known venous insufficiency and venous hypertension and this could be the issue here. The area on the right foot is about the same left buttock slightly worse according to our intake nurse secondary to [facility] Blue sticking to the wound 12/14; biopsy of the left foot that I did last time showed changes that could be related to wound healing/chronic stasis dermatitis phenomenon no neoplasm. We have been using silver alginate to both feet. I change the one on the left today to Sorbact and silver alginate to his other 2 wounds 12/28; the patient arrives with the following problems; Major issue is the dorsal left foot which continues to be a larger deeper wound area. Still with a completely nonviable surface Paradoxically the area mirror image on the right on the right dorsal foot appears to be getting better. He had some loss of dry denuded skin from the lower part of his original wound on the left lateral calf. Some of this area looked a little vulnerable and for this reason we put him in wrap that on this side this week The area on his left buttock is larger. He still has the erythematous circular area which I think is a combination of pressure, sweat. This does not look like cellulitis or fungal dermatitis 11/26/2019; -Dorsal left foot large open wound with depth. Still debris over the surface. Using Sorbact The area on the dorsal right foot paradoxically has closed over He has a reopening on the left ankle laterally at the base of his original wound that extended up into the calf. This appears clean. The left buttock wound is smaller but with very adherent necrotic  debris over the  surface. We have been using silver alginate here as well The patient had arterial studies done in 2017. He had biphasic waveforms at the dorsalis pedis and posterior tibial bilaterally. ABI in the left was 1.17. Digit waveforms were dampened. He has slight spasticity in the great toes I do not think a TBI would be possible 1/11; the patient comes in today with a sizable reopening between the first and second toes on the right. This is not exactly in the same location where we have been treating wounds previously. According to our intake nurse this was actually fairly deep but 0.6 cm. The area on the left dorsal foot looks about the same the surface is somewhat cleaner using Sorbact, his MRI is in 2 days. We have not managed yet to get arterial studies. The new reopening on the left lateral calf looks somewhat better using alginate. The left buttock wound is about the same using alginate 1/18; the patient had his ARTERIAL studies which were quite normal. ABI in the right at 1.13 with triphasic/biphasic waveforms on the left ABI 1.06 again with triphasic/biphasic waveforms. It would not have been possible to have done a toe brachial index because of spasticity. We have been using Sorbac to the left foot alginate to the rest of his wounds on the right foot left lateral calf and left buttock 1/25; arrives in clinic with erythema and swelling of the left forefoot worse over the first MTP area. This extends laterally dorsally and but also posteriorly. Still has an area on the left lateral part of the lower part of his calf wound it is eschared and clearly not closed. Area on the left buttock still with surrounding irritation and erythema. Right foot surface wound dorsally. The area between the right and first and second toes appears better. 2/1; The left foot wound is about the same. Erythema slightly better I gave him a week of doxycycline empirically Right foot wound is more extensive extending between  the toes to the plantar surface Left lateral calf really no open surface on the inferior part of his original wound however the entire area still looks vulnerable Absolutely no improvement in the left buttock wound required debridement. 2/8; the left foot is about the same. Erythema is slightly improved I gave him clindamycin last week. Right foot looks better he is using Lotrimin and silver alginate He has a breakdown in the left lateral calf. Denuded epithelium which I have removed Left buttock about the same were using Hydrofera Blue 2/15; left foot is about the same there is less surrounding erythema. Surface still has tightly adherent debris which I have debriding however not making any progress Right foot has a substantial wound on the medial right second toe between the first and second webspace. Still an open area on the left lateral calf distal area. Buttock wound is about the same 2/22; left foot is about the same less surrounding erythema. Surface has adherent debris. Polymen Ag Right foot area significant wound between the first and second toes. We have been using silver alginate here Left lateral leg polymen Ag at the base of his original venous insufficiency wound Left buttock some improvement here 3/1; Right foot is deteriorating in the first second toe webspace. Larger and more substantial. We have been using silver alginate. Left dorsal foot about the same markedly adherent surface debris using PolyMem Ag Left lateral calf surface debris using PolyMem AG Left buttock is improved again using PolyMem Ag. He is  completing his terbinafine. The erythema in the foot seems better. He has been on this for 2 weeks 3/8; no improvement in any wound area in fact he has a small open area on the dorsal midfoot which is new this week. He has not gotten his foot x-rays yet 3/15; his x-rays were both negative for osteomyelitis of both feet. No major change in any of his wounds on the  extremities however his buttock wounds are better. We have been using polymen on the buttocks, left lower leg. Iodoflex on the left foot and silver alginate on the right 3/22; arrives in clinic today with the 2 major issues are the improvement in the left dorsal foot wound which for once actually looks healthy with a nice healthy wound surface without debridement. Using Iodoflex here. Unfortunately on the left lateral calf which is in the distal part of his original wound he came to the clinic here for there was purulent drainage noted some increased breakdown scattered around the original area and a small area proximally. We we are using polymen here will change to silver alginate today. His buttock wound on the left is better and I think the area on the right first second toe webspace is also improved 3/29; left dorsal foot looks better. Using Iodoflex. Left ankle culture from deterioration last time grew E. coli, Enterobacter and Enterococcus. I will give him a course of cefdinir although that will not cover Enterococcus. The area on the right foot in the webspace of the first and second toe lateral first toe looks better. The area on his buttock is about healed Vascular appointment is on April 21. This is to look at his venous system vis--vis continued breakdown of the wounds on the left including the left lateral leg and left dorsal foot he. He has had previous ablations on this side 4/5; the area between the right first and second toes lateral aspect of the first toe looks better. Dorsal aspect of the left first toe on the left foot also improved. Unfortunately the left lateral lower leg is larger and there is a second satellite wound superiorly. The usual superficial abrasions on the left buttock overall better but certainly not closed 4/12; the area between the right first and second toes is improved. Dorsal aspect of the left foot also slightly smaller with a vibrant healthy looking  surface. No real change in the left lateral leg and the left buttock wound is healed He has an unaffordable co-pay for Apligraf. Appointment with vein and vascular with regards to the left leg venous part of the circulation is on 4/21 4/19; we continue to see improvement in all wound areas. Although this is minor. He has his vascular appointment on 4/21. The area on the left buttock has not reopened although right in the center of this area the skin looks somewhat threatened 4/26; the left buttock is unfortunately reopened. In general his left dorsal foot has a healthy surface and looks somewhat smaller although it was not measured as such. The area between his first and second toe webspace on the right as a small wound against the first toe. The patient saw vascular surgery. The real question I was asking was about the small saphenous vein on the left. He has previously ablated left greater saphenous vein. Nothing further was commented on on the left. Right greater saphenous vein without reflux at the saphenofemoral junction or proximal thigh there was no indication for ablation of the right greater saphenous vein duplex  was negative for DVT bilaterally. They did not think there was anything from a vascular surgery point of view that could be offered. They ABIs within normal limits 5/3; only small open area on the left buttock. The area on the left lateral leg which was his original venous reflux is now 2 wounds both which look clean. We are using Iodoflex on the left dorsal foot which looks healthy and smaller. He is down to a very tiny area between the right first and second toes, using silver alginate 5/10; all of his wounds appear better. We have much better edema control in 4 layer compression on the left. This may be the factor that is allowing the left foot and left lateral calf to heal. He has external compression garments at home 04/14/20-All of his wounds are progressing well, the left  forefoot is practically closed, left ischium appears to be about the same, right toe webspace is also smaller. The left lateral leg is about the same, continue using Hydrofera Blue to this, silver alginate to the ischium, Iodoflex to the toe space on the right 6/7; most of his wounds outside of the left buttock are doing well. The area on the left lateral calf and left dorsal foot are smaller. The area on the right foot in between the first and second toe webspace is barely visible although he still says there is some drainage here is the only reason I did not heal this out. Unfortunately the area on the left buttock almost looks like he has a skin tear from tape. He has open wound and then a large flap of skin that we are trying to get adherence over an area just next to the remaining wound 6/21; 2 week follow-up. I believe is been here for nurse visits. Miraculously the area between his first and second toes on the left dorsal foot is closed over. Still open on the right first second web space. The left lateral calf has 2 open areas. Distally this is more superficial. The proximal area had a little more depth and required debridement of adherent necrotic material. His buttock wound is actually larger we have been using silver alginate here 6/28; the patient's area on the left foot remains closed. Still open wet area between the first and second toes on the right and also extending into the plantar aspect. We have been using silver alginate in this location. He has 2 areas on the left lower leg part of his original long wounds which I think are better. We have been using Hydrofera Blue here. Hydrofera Blue to the left buttock which is stable 7/12; left foot remains closed. Left ankle is closed. May be a small area between his right first and second toes the only truly open area is on the left buttock. We have been using Hydrofera Blue here 7/19; patient arrives with marked deterioration especially in  the left foot and ankle. We did not put him in a compression wrap on the left last week in fact he wore his juxta lite stockings on either side although he does not have an underlying stocking. He has a reopening on the left dorsal foot, left lateral ankle and a new area on the right dorsal ankle. More worrisome is the degree of erythema on the left foot extending on the lateral foot into the lateral lower leg on the left 7/26; the patient had erythema and drainage from the lateral left ankle last week. Culture of this grew MRSA resistant to  doxycycline and clindamycin which are the 2 antibiotics we usually use with this patient who has multiple antibiotic allergies including linezolid, trimethoprim sulfamethoxazole. I had give him an empiric doxycycline and he comes in the area certainly looks somewhat better although it is blotchy in his lower leg. He has not been systemically unwell. He has had areas on the left dorsal foot which is a reopening, chronic wounds on the left lateral ankle. Both of these I think are secondary to chronic venous insufficiency. The area between his first and second toes is closed as far as I can tell. He had a new wrap injury on the right dorsal ankle last week. Finally he has an area on the left buttock. We have been using silver alginate to everything except the left buttock we are using Hydrofera Blue 06/30/20-Patient returns at 1 week, has been given a sample dose pack of NUZYRA which is a tetracycline derivative [omadacycline], patient has completed those, we have been using silver alginate to almost all the wounds except the left ischium where we are using Hydrofera Blue all of them look better 8/16; since I last saw the patient he has been doing well. The area on the left buttock, left lateral ankle and left foot are all closed today. He has completed the [place] I gave him last time and tolerated this well. He still has open areas on the right dorsal ankle and in the  right first second toe area which we are using silver alginate. 8/23; we put him in his bilateral external compression stockings last week as he did not have anything open on either leg except for concerning area between the right first and second toe. He comes in today with an area on the left dorsal foot slightly more proximal than the original wound, the left lateral foot but this is actually a continuation of the area he had on the left lateral ankle from last time. As well he is opened up on the left buttock again. 8/30; comes in today with things looking a lot better. The area on the left lower ankle has closed down as has the left foot but with eschar in both areas. The area on the dorsal right ankle is also epithelialized. Very little remaining of the left buttock wound. We have been using silver alginate on all wound areas 9/13; the area in the first second toe webspace on the right has fully epithelialized. He still has some vulnerable epithelium on the right and the ankle and the dorsal foot. He notes weeping. He is using his juxta lite stocking. On the left again the left dorsal foot is closed left lateral ankle is closed. We went to the juxta lite stocking here as well. Still vulnerable in the left buttock although only 2 small open areas remain here 9/27; 2-week follow-up. We did not look at his left leg but the patient says everything is closed. He is a bit disturbed by the amount of edema in his left foot he is using juxta lite stockings but asking about over the toes stockings which would be 30/40, will talk to him next time. According to him there is no open wound on either the left foot or the left ankle/calf He has an open area on the dorsal right calf which I initially point a wrap injury. He has superficial remaining wound on the left ischial tuberosity been using silver alginate although he says this sticks to the wound 10/5; we gave him 2-week follow-up but  he called yesterday  expressing some concerns about his right foot right ankle and the left buttock. He came in early. There is still no open areas on the left leg and that still in his juxta lite stocking 10/11; he only has 1 small area on the left buttock that remains measuring millimeters 1 mm. Still has the same irritated skin in this area. We recommended zinc oxide when this eventually closes and pressure relief is meticulously is he can do this. He still has an area on the dorsal part of his right first through third toes which is a bit irritated and still open and on the dorsal ankle near the crease of the ankle. We have been using silver alginate and using his own stocking. He has nothing open on the left leg or foot 10/25; 2-week follow-up. Not nearly as good on the left buttock as I was hoping. For open areas with 5 looking threatened small. He has the erythematous irritated chronic skin in this area. 1 area on the right dorsal ankle. He reports this area bleeds easily Right dorsal foot just proximal to the base of his toes We have been using silver alginate. 11/8; 2-week follow-up. Left buttock is about the same although I do not think the wounds are in the same location we have been using silver alginate. I have asked him to use zinc oxide on the skin around the wounds. He still has a small area on the right dorsal ankle he reports this bleeds easily Right dorsal foot just proximal to the base of the toes does not have anything open although the skin is very dry and scaly He has a new opening on the nailbed of the left great toe. Nothing on the left ankle 11/29; 3-week follow-up. Left buttock has 2 open areas. And washing of these wounds today started bleeding easily. Suggesting very friable tissue. We have been using silver alginate. Right dorsal ankle which I thought was initially a wrap injury we have been using silver alginate. Nothing open between the toes that I can see. He states the area on the left  dorsal toe nailbed healed after the last visit in 2 or 3 days Electronic Signature(s) Signed: 10/20/2020 4:47:57 PM By: Linton Ham MD Signed: 10/20/2020 4:47:57 PM By: Linton Ham MD Entered By: Linton Ham on 10/20/2020 08:46:30 -------------------------------------------------------------------------------- Chemical Cauterization Details Patient Name: Date of Service: Mainor, [name] 10/20/2020 7:30 A M Medical Record Number: [id number] Patient Account Number: [id number] Date of Birth/Sex: Treating RN: 12/11/1985 (34 y.o. [name] Primary Care Provider: [place], [place] Other Clinician: Referring Provider: Treating Provider/Extender: [name]: 250 Procedure Performed for: Wound #41R Left Ischium Performed By: Physician Ricard Dillon., MD Post Procedure Diagnosis Same as Pre-procedure Notes silver nitrate Electronic Signature(s) Signed: 10/20/2020 4:47:57 PM By: Linton Ham MD Entered By: Linton Ham on 10/20/2020 08:44:10 -------------------------------------------------------------------------------- Physical Exam Details Patient Name: Date of Service: Chalfin, [name] 10/20/2020 7:30 A M Medical Record Number: [id number] Patient Account Number: [id number] Date of Birth/Sex: Treating RN: 02-Aug-1986 (34 y.o. [name] Primary Care Provider: [place], [place] Other Clinician: Referring Provider: Treating Provider/Extender: [name]: 250 Constitutional Patient is hypertensive.. Pulse regular and within target range for patient.Marland Kitchen Respirations regular, non-labored and within target range.. Temperature is normal and within the target range for the patient.Marland Kitchen Appears in no distress. Notes Wound exam; Left buttock he has 2 open areas. Very friable granulation which bled  with simply wiping with gauze. I used silver nitrate on this to cauterize this tissue will change the  primary dressing to polymen - Right dorsal ankle have not been able to make much progress here. Aggressive debridement with a #3 curette removing skin callus and subcutaneous tissue hemostasis with direct pressure The webspace of his first and second toes on the right foot as well as the dorsal and plantar first and second toes all look as though they are closing. Electronic Signature(s) Signed: 10/20/2020 4:47:57 PM By: Linton Ham MD Entered By: Linton Ham on 10/20/2020 08:47:57 -------------------------------------------------------------------------------- Physician Orders Details Patient Name: Date of Service: Andre, [name] 10/20/2020 7:30 A M Medical Record Number: [id number] Patient Account Number: [id number] Date of Birth/Sex: Treating RN: 15-Nov-1986 (34 y.o. 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 skin over the area. Also the area at the base of the left first and second toe in a similar condition 05/12/17; continued gradual improvement in the refractory left lateral venous insufficiency wound on the left. Dimension smaller. Surface still requiring debridement using Hydrofera Blue 05/19/17; continued gradual improvement in the refractory left lateral venous ulceration. Careful inspection of the wound bed underlying rumination suggested some degree of epithelialization over the surface no debridement indicated. Continue Hydrofera Blue difficult areas between his toes first and third on the left than first and second on the right. I'm going to change to silver alginate from silver collagen. Continue ketoconazole as I suspect underlying tinea pedis 05/26/17; left lateral leg venous insufficiency wound. We've been using Hydrofera Blue. I believe that there is expanding epithelialization over the surface of the wound albeit not coming from the wound circumference. This is a bit of an odd situation in which the epithelialization seems to be coming from the surface of the wound rather than in the exact circumference. There is still small open areas mostly along the lateral margin of the wound. ooHe has unchanged areas between the left first and second and the right first second toes which I been treating for tenia pedis 06/02/17; left lateral leg venous insufficiency wound. We have been using Hydrofera Blue. Somewhat smaller from the wound circumference. The surface of the wound remains a bit on it almost epithelialized sedation in appearance. I use an open curette today debridement in the surface of all  of this especially the edges ooSmall open wounds remaining on the dorsal right first and second toe interspace and the plantar left first second toe and her face on the left 06/09/17; wound on the left lateral leg continues to be smaller but very gradual and very dry surface using Hydrofera Blue 06/16/17 requires weekly debridements now on the left lateral leg although this continues to contract. I changed to silver collagen last week because of dryness of the wound bed. Using Iodoflex to the areas on his first and second toes/web space bilaterally 06/24/17; patient with history of paraplegia also chronic venous insufficiency with lymphedema. Has a very difficult wound on the left lateral leg. This has been gradually reducing in terms of with but comes in with a very dry adherent surface. High switch to silver collagen a week or so ago with hydrogel to keep the area moist. This is been refractory to multiple dressing attempts. He also has areas in his first and second toes bilaterally in the anterior and posterior web space. I had been using Iodoflex here after a prolonged course of silver alginate with ketoconazole was ineffective [question tinea pedis] 07/14/17; patient arrives today with a very difficult adherent material over his left lateral lower leg wound. He also has surrounding erythema and poorly controlled edema. He was switched his Santyl last visit which the nurses are applying once during his doctor visit and once on a nurse visit. He was also reduced to 2 layer compression I'm not exactly sure of the issue here. 07/21/17; better surface today after 1 week of Iodoflex. Significant cellulitis that we treated last week also better. [Doxycycline] 07/28/17  better surface today with now 2 weeks of Iodoflex. Significant cellulitis treated with doxycycline. He has now completed the doxycycline and he is back to his usual degree of chronic venous inflammation/stasis dermatitis. He reminds me he has had  ablations surgery here 08/04/17; continued improvement with Iodoflex to the left lateral leg wound in terms of the surface of the wound although the dimensions are better. 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 Problematic areas on the plantar aspect of the first second toe web space on the left and the dorsal aspect of the first second toe web space on the right. At one point I felt these were probably related to chronic fungal infections in treated him aggressively for this although we have not made any improvement here. 08/11/17; left lateral leg. Surface continues to improve with the Iodoflex although we are not seeing much improvement in overall wound dimensions. Areas on his plantar left foot and right foot show no improvement. In fact the right foot looks somewhat worse 08/18/17; left lateral leg. We changed to [facility] Blue last week after a prolonged course of Iodoflex which helps get the surface better. It appears that the wound with is improved. Continue with difficult areas on the left dorsal first second and plantar first second on the right 09/01/17; patient arrives in clinic today having had a temperature of 103 yesterday. He was seen in the ER and [facility]. The patient was concerned he could have cellulitis again in the right leg however they diagnosed him with a UTI and he is now on Keflex. He has a history of cellulitis which is been recurrent and difficult but this is been in the left leg, in the past 5 use doxycycline. He does in and out catheterizations at home which are risk factors for UTI 09/08/17; patient will be completing his Keflex this weekend. The erythema on the left leg is considerably better. He has a new wound today on the medial part of the right leg small superficial almost looks like a skin tear. He has worsening of the area on the right dorsal first and second toe. His major area on the  left lateral leg is better. Using Hydrofera Blue on all areas 09/15/17; gradual reduction in width on the long wound in the left lateral leg. No debridement required. He also has wounds on the plantar aspect of his left first second toe web space and on the dorsal aspect of the right first second toe web space. 09/22/17; there continues to be very gradual improvements in the dimensions of the left lateral leg wound. He hasn't round erythematous spot with might be pressure on his wheelchair. There is no evidence obviously of infection no purulence no warmth ooHe has a dry scaled area on the plantar aspect of the left first second toe ooImproved area on the dorsal right first second toe. 09/29/17; left lateral leg wound continues to improve in dimensions mostly with an is still a fairly long but increasingly narrow wound. ooHe has a dry scaled area on the plantar aspect of his left first second toe web space ooIncreasingly concerning area on the dorsal right first second toe. In fact I am concerned today about possible cellulitis around this wound. The areas extending up his second toe and although there is deformities here almost appears to abut on the nailbed. 10/06/17; left lateral leg wound continues to make very gradual progress. Tissue culture I did from the right  first second toe dorsal foot last time grew MRSA and enterococcus which was vancomycin sensitive. This was not sensitive to clindamycin or doxycycline. He is allergic to Zyvox and sulfa we have therefore arrange for him to have dalvance infusion tomorrow. He is had this in the past and tolerated it well 10/20/17; left lateral leg wound continues to make decent progress. This is certainly reduced in terms of with there is advancing epithelialization.ooThe cellulitis in the right foot looks better although he still has a deep wound in the dorsal aspect of the first second toe web space. Plantar left first toe web space on the left  I think is making some progress 10/27/17; left lateral leg wound continues to make decent progress. Advancing epithelialization.using Hydrofera Blue ooThe right first second toe web space wound is better-looking using silver alginate ooImprovement in the left plantar first second toe web space. Again using silver alginate 11/03/17 left lateral leg wound continues to make decent progress albeit slowly. Using Hydrofera Blue ooThe right per second toe web space continues to be a very problematic looking punched out wound. I obtained a piece of tissue for deep culture I did extensively treated this for fungus. It is difficult to imagine that this is a pressure area as the patient states other than going outside he doesn't really wear shoes at home ooThe left plantar first second toe web space looked fairly senescent. Necrotic edges. This required debridement oochange to Hydrofera Blue to all wound areas 11/10/17; left lateral leg wound continues to contract. Using Hydrofera Blue ooOn the right dorsal first second toe web space dorsally. Culture I did of this area last week grew MRSA there is not an easy oral option in this patient was multiple antibiotic allergies or intolerances. This was only a rare culture isolate I'm therefore going to use Bactroban under silver alginate ooOn the left plantar first second toe web space. Debridement is required here. This is also unchanged 11/17/17; left lateral leg wound continues to contract using Hydrofera Blue this is no longer the major issue. ooThe major concern here is the right first second toe web space. He now has an open area going from dorsally to the plantar aspect. There is now wound on the inner lateral part of the first toe. Not a very viable surface on this. There is erythema spreading medially into the forefoot. ooNo major change in the left first second toe plantar wound 11/24/17; left lateral leg wound continues to contract using Hydrofera  Blue. Nice improvement today ooThe right first second toe web space all of this looks a lot less angry than last week. I have given him clindamycin and topical Bactroban for MRSA and terbinafine for the possibility of underlining tinea pedis that I could not control with ketoconazole. Looks somewhat better ooThe area on the plantar left first second toe web space is weeping with dried debris around the wound 12/01/17; left lateral leg wound continues to contract he Hydrofera Blue. It is becoming thinner in terms of with nevertheless it is making good improvement. ooThe right first second toe web space looks less angry but still a large necrotic-looking wounds starting on the plantar aspect of the right foot extending between the toes and now extensively on the base of the right second toe. I gave him clindamycin and topical Bactroban for MRSA anterior benefiting for the possibility of underlying tinea pedis. Not looking better today ooThe area on the left first/second toe looks better. Debrided of necrotic debris 12/05/17* the patient  was worked in urgently today because over the weekend he found blood on his incontinence bad when he woke up. He was found to have an ulcer by his wife who does most of his wound care. He came in today for [place] to look at this. He has not had a history of wounds in his buttocks in spite of his paraplegia. 12/08/17; seen in follow-up today at his usual appointment. He was seen earlier this week and found to have a new wound on his buttock. We also follow him for wounds on the left lateral leg, left first second toe web space and right first second toe web space 12/15/17; we have been using Hydrofera Blue to the left lateral leg which has improved. The right first second toe web space has also improved. Left first second toe web space plantar aspect looks stable. The left buttock has worsened using Santyl. Apparently the buttock has drainage 12/22/17; we have been using  Hydrofera Blue to the left lateral leg which continues to improve now 2 small wounds separated by normal skin. He tells [place] he had a fever up to 100 yesterday he is prone to UTIs but has not noted anything different. He does in and out catheterizations. The area between the first and second toes today does not look good necrotic surface covered with what looks to be purulent drainage and erythema extending into the third toe. I had gotten this to something that I thought look better last time however it is not look good today. He also has a necrotic surface over the buttock wound which is expanded. I thought there might be infection under here so I removed a lot of the surface with a #5 curet though nothing look like it really needed culturing. He is been using Santyl to this area 12/27/17; his original wound on the left lateral leg continues to improve using Hydrofera Blue. I gave him samples of Baxdella although he was unable to take them out of fear for an allergic reaction ["lump in his throat"].the culture I did of the purulent drainage from his second toe last week showed both enterococcus and a set Enterobacter I was also concerned about the erythema on the bottom of his foot although paradoxically although this looks somewhat better today. 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 We've been using silver alginate here. They came up today that he sleeps in a recliner, I'm not sure why but I asked him to stop this 01/03/18; his original wound we've been using Hydrofera Blue is now separated into 2 areas. ooUlcer on his left buttock is better he is off the recliner and sleeping in bed ooFinally both wound areas between his first and second toes also looks some better 01/10/18; his original wound on the left lateral leg is now separated into 2 wounds we've been using Hydrofera Blue ooUlcer on his left  buttock has some drainage. There is a small probing site going into muscle layer superiorly.using silver alginate -He arrives today with a deep tissue injury on the left heel ooThe wound on the dorsal aspect of his first second toe on the left looks a lot betterusing silver alginate ketoconazole ooThe area on the first second toe web space on the right also looks a lot bette 01/17/18; his original wound on the left lateral leg continues to progress using Hydrofera Blue ooUlcer on his left buttock also is smaller  surface healthier except for a small probing site going into the muscle layer superiorly. 2.4 cm of tunneling in this area ooDTI on his left heel we have only been offloading. Looks better than last week no threatened open no evidence of infection oothe wound on the dorsal aspect of the first second toe on the left continues to look like it's regressing we have only been using silver alginate and terbinafine orally ooThe area in the first second toe web space on the right also looks to be a lot better using silver alginate and terbinafine I think this was prompted by tinea pedis 01/31/18; the patient was hospitalized in [place] last week apparently for a complicated UTI. He was discharged on cefepime he does in and out catheterizations. In the hospital he was discovered M I don't mild elevation of AST and ALT and the terbinafine was stopped.predictably the pressure ulcer on s his buttock looks betterusing silver alginate. The area on the left lateral leg also is better using Hydrofera Blue. The area between the first and second toes on the left better. First and second toes on the right still substantial but better. Finally the DTI on the left heel has held together and looks like it's resolving 02/07/18-he is here in follow-up evaluation for multiple ulcerations. He has new injury to the lateral aspect of the last issue a pressure ulcer, he states this is from adhesive removal trauma.  He states he has tried multiple adhesive products with no success. All other ulcers appear stable. The left heel DTI is resolving. We will continue with same treatment plan and follow-up next week. 02/14/18; follow-up for multiple areas. ooHe has a new area last week on the lateral aspect of his pressure ulcer more over the posterior trochanter. The original pressure ulcer looks quite stable has healthy granulation. 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 Almost fully epithelialized on the original superior area using Hydrofera Blue ooDTI on the left heel has peeled off this week to reveal a small superficial wound under denuded skin and subcutaneous tissue ooBoth areas between the first and second toes look better including nothing open on the left 02/21/18; ooThe patient's wounds on his left ischial tuberosity and posterior left greater trochanter actually looked better. He has a large area of irritation around the area which I think is contact dermatitis. I am doubtful that this is fungal ooHis original wound on the left lateral calf continues to improve we have been using Hydrofera Blue ooThere is no open area in the left first second toe web space although there is a lot of thick callus ooThe DTI on the left heel required debridement today of necrotic surface eschar and subcutaneous tissue using silver alginate ooFinally the area on the right first second toe webspace continues to contract using silver alginate and ketoconazole 02/28/18 ooLeft ischial tuberosity wounds look better using silver alginate. ooOriginal wound on the left calf only has one small open area left using Hydrofera Blue ooDTI on the left heel required debridement mostly removing skin from around this wound surface. Using silver alginate ooThe areas on the right first/second toe web space using silver  alginate and ketoconazole 03/08/18 on evaluation today patient appears to be doing decently well as best I can tell in regard to his wounds. This is the first time that I have seen him as he generally is followed by Dr. Dellia Nims. With that  being said none of his wounds appear to be infected he does have an area where there is some skin covering what appears to be a new wound on the left dorsal surface of his great toe. This is right at the nail bed. With that being said I do believe that debrided away some of the excess skin can be of benefit in this regard. Otherwise he has been tolerating the dressing changes without complication. 03/14/18; patient arrives today with the multiplicity of wounds that we are following. He has not been systemically unwell ooOriginal wound on the left lateral calf now only has 2 small open areas we've been using Hydrofera Blue which should continue ooThe deep tissue injury on the left heel requires debridement today. We've been using silver alginate ooThe left first second toe and the right first second toe are both are reminiscence what I think was tinea pedis. Apparently some of the callus Surface between the toes was removed last week when it started draining. ooPurulent drainage coming from the wound on the ischial tuberosity on the left. 03/21/18-He is here in follow-up evaluation for multiple wounds. 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 He tolerated debridement. The only change to last week's recommendations is to discontinue antifungal cream between toes. He will follow-up next week 03/28/18; following up for multiple wounds;Concern this week is streaking redness and swelling in the right foot. He is going to need antibiotics for this. 03/31/18; follow-up for right foot cellulitis. Streaking redness and swelling in the right foot on 03/28/18. He has multiple antibiotic intolerances and a  history of MRSA. I put him on clindamycin 300 mg every 6 and brought him in for a quick check. He has an open wound between his first and second toes on the right foot as a potential source. 04/04/18; ooRight foot cellulitis is resolving he is completing clindamycin. This is truly good news ooLeft lateral calf wound which is initial wound only has one small open area inferiorly this is close to healing out. He has compression stockings. We will use Hydrofera Blue right down to the epithelialization of this ooNonviable surface on the left heel which was initially pressure with a DTI. We've been using Hydrofera Blue. I'm going to switch this back to silver alginate ooLeft first second toe/tinea pedis this looks better using silver alginate ooRight first second toe tinea pedis using silver alginate ooLarge pressure ulcers on theLeft ischial tuberosity. Small wound here Looks better. I am uncertain about the surface over the large wound. Using silver alginate 04/11/18; ooCellulitis in the right foot is resolved ooLeft lateral calf wound which was his original wounds still has 2 tiny open areas remaining this is just about closed ooNonviable surface on the left heel is better but still requires debridement ooLeft first second toe/tinea pedis still open using silver alginate ooRight first second toe wound tinea pedis I asked him to go back to using ketoconazole and silver alginate ooLarge pressure ulcers on the left ischial tuberosity this shear injury here is resolved. Wound is smaller. No evidence of infection using silver alginate 04/18/18; ooPatient arrives with an intense area of cellulitis in the right mid lower calf extending into the right heel area. Bright red and warm. Smaller area on the left anterior leg. He has a significant history of MRSA. He will definitely need antibioticsoodoxycycline ooHe now has 2 open areas on the left ischial tuberosity the original large wound and  now a  satellite area which I think was above his initial satellite areas. Not a wonderful surface on this satellite area surrounding erythema which looks like pressure related. ooHis left lateral calf wound again his original wound is just about closed ooLeft heel pressure injury still requiring debridement ooLeft first second toe looks a lot better using silver alginate ooRight first second toe also using silver alginate and ketoconazole cream also looks better 04/20/18; the patient was worked in early today out of concerns with his cellulitis on the right leg. I had started him on doxycycline. This was 2 days ago. His wife was concerned about the swelling in the area. Also concerned about the left buttock. He has not been systemically unwell no fever chills. No nausea vomiting or diarrhea 04/25/18; the patient's left buttock wound is continued to deteriorate he is using Hydrofera Blue. He is still completing clindamycin for the cellulitis on the right leg although all of this looks better. 05/02/18 ooLeft buttock wound still with a lot of drainage and a very tightly adherent fibrinous necrotic surface. He has a deeper area superiorly ooThe left lateral calf wound is still closed ooDTI wound on the left heel necrotic surface especially the circumference using Iodoflex ooAreas between his left first second toe and right first second toe both look better. Dorsally and the right first second toe he had a necrotic surface although at smaller. In using silver alginate and ketoconazole. I did a culture last week which was a deep tissue culture of the reminiscence of the open wound on the right first second toe dorsally. This grew a few Acinetobacter and a few methicillin-resistant staph aureus. Nevertheless the area actually this week looked better. I didn't feel the need to specifically address this at least in terms of systemic antibiotics. 05/09/18; wounds are measuring larger more drainage per  our intake. We are using Santyl covered with alginate on the large superficial buttock wounds, Iodosorb on the left heel, ketoconazole and silver alginate to the dorsal first and second toes bilaterally. 05/16/18; ooThe area on his left buttock better in some aspects although the area superiorly over the ischial tuberosity required an extensive debridement.using Santyl ooLeft heel appears stable. Using Iodoflex ooThe areas between his first and second toes are not bad however there is spreading erythema up the dorsal aspect of his left foot this looks like cellulitis again. He is insensate the erythema is really very brilliant.o Erysipelas He went to see an allergist days ago because he was itching part of this he had lab work done. This showed a white count of 15.1 with 70% neutrophils. Hemoglobin of 11.4 and a platelet count of 659,000. Last white count we had in [facility] was a 2-1/2 years ago which was 25.9 but he was ill at the time. He was able to show me some lab work that was done by his primary physician the pattern is about the same. 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 But prompted me to go ahead and do x-rays of both feet and the pelvis rule out osteomyelitis. He also had a comprehensive metabolic panel this was reasonably normal his albumin was 3.7 liver function tests BUN/creatinine all normal 05/23/18; x-rays of both his feet from last week were negative for underlying pulmonary abnormality. The x-ray of his pelvis however showed mild irregularity in the left ischial which may represent some early osteomyelitis. The wound in the left ischial continues to get deeper clearly now exposed  muscle. Each week necrotic surface material over this area. Whereas the rest of the wounds do not look so bad. ooThe left ischial wound we have been using Santyl and calcium alginate ooT the left heel surface necrotic debris using Iodoflex o ooThe left  lateral leg is still healed ooAreas on the left dorsal foot and the right dorsal foot are about the same. There is some inflammation on the left which might represent contact dermatitis, fungal dermatitis I am doubtful cellulitis although this looks better than last week 05/30/18; CT scan done at Hospital did not show any osteomyelitis or abscess. Suggested the possibility of underlying cellulitis although I don't see a lot of evidence of this at the bedside ooThe wound itself on the left buttock/upper thigh actually looks somewhat better. No debridement ooLeft heel also looks better no debridement continue Iodoflex ooBoth dorsal first second toe spaces appear better using Lotrisone. Left still required debridement 06/06/18; ooIntake reported some purulent looking drainage from the left gluteal wound. Using Santyl and calcium alginate ooLeft heel looks better although still a nonviable surface requiring debridement ooThe left dorsal foot first/second webspace actually expanding and somewhat deeper. I may consider doing a shave biopsy of this area ooRight dorsal foot first/second webspace appears stable to improved. Using Lotrisone and silver alginate to both these areas 06/13/18 ooLeft gluteal surface looks better. Now separated in the 2 wounds. No debridement required. Still drainage. We'll continue silver alginate ooLeft heel continues to look better with Iodoflex continue this for at least another week ooOf his dorsal foot wounds the area on the left still has some depth although it looks better than last week. We've been using Lotrisone and silver alginate 06/20/18 ooLeft gluteal continues to look better healthy tissue ooLeft heel continues to look better healthy granulation wound is smaller. He is using Iodoflex and his long as this continues continue the Iodoflex ooDorsal right foot looks better unfortunately dorsal left foot does not. There is swelling and erythema of his  forefoot. He had minor trauma to this several days ago but doesn't think this was enough to have caused any tissue injury. Foot looks like cellulitis, we have had this problem before 06/27/18 on evaluation today patient appears to be doing a little worse in regard to his foot ulcer. 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 Both of which would really be his only options for treating this infection. In the past he has been given and effusion of Orbactiv. This is done very well for him in the past again it's one time dosing IV antibiotic therapy. Subsequently I do believe this is something we're gonna need to see about doing at this point in time. Currently his other wounds seem to be doing somewhat better in my pinion I'm pretty happy in that regard. 07/03/18 on evaluation today patient's wounds actually appear to be doing fairly well. He has been tolerating the dressing changes without complication. All in all he seems to be showing signs of improvement. In regard to the antibiotics he has been dealing with infectious disease since I saw him last week as far as getting this scheduled. In the end [facility]'s [facility] this coming Friday. In the meantime he has been continuing to perform the dressing changes in such as previous. There does not  appear to be any evidence of infection worsengin at this time. 07/10/18; ooSince I last saw this man 2 weeks ago things have actually improved. IV antibiotics of resulted in less forefoot erythema although there is still some present. He is not systemically unwell ooLeft buttock wounds o2 now have no depth there is increased epithelialization Using silver alginate ooLeft heel still requires debridement using Iodoflex ooLeft dorsal foot still with a sizable wound about  the size of a border but healthy granulation ooRight dorsal foot still with a slitlike area using silver alginate 07/18/18; the patient's cellulitis in the left foot is improved in fact I think it is on its way to resolving. ooLeft buttock wounds o2 both look better although the larger one has hypertension granulation we've been using silver alginate ooLeft heel has some thick circumferential redundant skin over the wound edge which will need to be removed today we've been using Iodoflex ooLeft dorsal foot is still a sizable wound required debridement using silver alginate ooThe right dorsal foot is just about closed only a small open area remains here 07/25/18; left foot cellulitis is resolved ooLeft buttock wounds o2 both look better. Hyper-granulation on the major area ooLeft heel as some debris over the surface but otherwise looks a healthier wound. Using silver collagen ooRight dorsal foot is just about closed 07/31/18; arrives with our intake nurse worried about purulent drainage from the buttock. We had hyper-granulation here last week ooHis buttock wounds o2 continue to look better ooLeft heel some debris over the surface but measuring smaller. ooRight dorsal foot unfortunately has openings between the toes ooLeft foot superficial wound looks less aggravated. 08/07/18 ooButtock wounds continue to look better although some of her granulation and the larger medial wound. silver alginate ooLeft heel continues to look a lot better.silver collagen ooLeft foot superficial wound looks less stable. Requires debridement. He has a new wound superficial area on the foot on the lateral dorsal foot. ooRight foot looks better using silver alginate without Lotrisone 08/14/2018; patient was in the ER last week diagnosed with a UTI. He is now on Cefpodoxime and Macrodantin. ooButtock wounds continued to be smaller. Using silver alginate ooLeft heel continues to look better using silver  collagen ooLeft foot superficial wound looks as though it is improving ooRight dorsal foot area is just about healed. 08/21/2018; patient is completed his antibiotics for his UTI. ooHe has 2 open areas on the buttocks. There is still not closed although the surface looks satisfactory. Using silver alginate ooLeft heel continues to improve using silver collagen ooThe bilateral dorsal foot areas which are at the base of his first and second toes/possible tinea pedis are actually stable on the left but worse on the right. The area on the left required debridement of necrotic surface. After debridement I obtained a specimen for PCR culture. ooThe right dorsal foot which is been just about healed last week is now reopened 08/28/2018; culture done on the left dorsal foot showed coag negative staph both staph epidermidis and Lugdunensis. I think this is worthwhile initiating systemic treatment. I will use doxycycline given his long list of allergies. The area on the left heel slightly improved but still requiring debridement. ooThe large wound on the buttock is just about closed whereas the smaller one is larger. Using silver alginate in this area 09/04/2018; patient is completing his doxycycline for the left foot although this continues to be a very difficult wound area with very adherent necrotic debris. We are using silver alginate to all  his wounds right foot left foot and the small wounds on his buttock, silver collagen on the left heel. 09/11/2018; once again this patient has intense erythema and swelling of the left forefoot. Lesser degrees of erythema in the right foot. He has a long list of allergies and intolerances. I will reinstitute doxycycline. 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 Using silver alginate ooLeft heel also looks better using silver collagen ooUnfortunately both the areas on his feet look worse. The area on the left  first second webspace is now gone through to the plantar part of his foot. The area on the left foot anteriorly is irritated with erythema and swelling in the forefoot. 09/25/2018 ooHis wound on the left plantar heel looks better. Using silver collagen ooThe area on the left buttock 2 small remnant areas. One is closed one is still open. Using silver alginate ooThe areas between both his first and second toes look worse. This in spite of long-standing antifungal therapy with ketoconazole and silver alginate which should have antifungal activity ooHe has small areas around his original wound on the left calf one is on the bottom of the original scar tissue and one superiorly both of these are small and superficial but again given wound history in this site this is worrisome 10/02/2018 ooLeft plantar heel continues to gradually contract using silver collagen ooLeft buttock wound is unchanged using silver alginate ooThe areas on his dorsal feet between his first and second toes bilaterally look about the same. 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 Small open area here. We have been using silver alginate 10/09/2018; ooLeft plantar heel continues to look healthy and contract. No debridement is required ooLeft buttock slightly smaller with a tape injury wound just below which was new this week ooDorsal feet somewhat improved I have been using clindamycin ooLeft lateral looks lower extremity the actual open area looks worse although a lot of this is epithelialized. I am going to change to silver collagen today He has a lot more swelling in the right leg although this is not pitting not red and not particularly warm there is a lot of spasm in the right leg usually indicative of people with paralysis of some  underlying discomfort. We have reviewed his vascular status from 2017 he had a left greater saphenous vein ablation. I wonder about referring him back to vascular surgery if the area on the left leg continues to deteriorate. 10/16/2018 in today for follow-up and management of multiple lower extremity ulcers. His left Buttock wound is much lower smaller and almost closed completely. The wound to the left ankle has began to reopen with Epithelialization and some adherent slough. He has multiple new areas to the left foot and leg. The left dorsal foot without much improvement. Wound present between left great webspace and 2nd toe. Erythema and edema present right leg. Right LE ultrasound obtained on 10/10/18 was negative for DVT . 10/23/2018; ooLeft buttock is closed over. Still dry macerated skin but there is no open wound. I suspect this is chronic pressure/moisture ooLeft lateral calf is quite a bit worse than when I saw this last. There is clearly drainage here he has macerated skin into the left plantar heel. We will change the primary dressing to alginate ooLeft dorsal  foot has some improvement in overall wound area. Still using clindamycin and silver alginate ooRight dorsal foot about the same as the left using clindamycin and silver alginate ooThe erythema in the right leg has resolved. He is DVT rule out was negative ooLeft heel pressure area required debridement although the wound is smaller and the surface is health 10/26/2018 ooThe patient came back in for his nurse check today predominantly because of the drainage coming out of the left lateral leg with a recent reopening of his original wound on the left lateral calf. He comes in today with a large amount of surrounding erythema around the wound extending from the calf into the ankle and even in the area on the dorsal foot. He is not systemically unwell. He is not febrile. Nevertheless this looks like cellulitis. We have been using  silver alginate to the area. I changed him to a regular visit and I am going to prescribe him doxycycline. The rationale here is a long list of medication intolerances and a history of MRSA. I did not see anything that I thought would provide a valuable culture 10/30/2018 ooFollow-up from his appointment 4 days ago with really an extensive area of cellulitis in the left calf left lateral ankle and left dorsal foot. I put him on doxycycline. He has a long list of medication allergies which are true allergy reactions. 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 In any case he is a lot better today. The erythema especially in the anterior and lateral left calf is better. He still has left ankle erythema. He also is complaining about increasing edema in the right leg we have only been using Kerlix Coban and he has been doing the wraps at home. Finally he has a spotty rash on the medial part of his upper left calf which looks like folliculitis or perhaps wrap occlusion type injury. Small superficial macules not pustules 11/06/18 patient arrives today with again a considerable degree of erythema around the wound on the left lateral calf extending into the dorsal ankle and dorsal foot. This is a lot worse than when I saw this last week. He is on doxycycline really with not a lot of improvement. He has not been systemically unwell Wounds on the; left heel actually looks improved. Original area on the left foot and proximity to the first and second toes looks about the same. He has superficial areas on the dorsal foot, anterior calf and then the reopening of his original wound on the left lateral calf which looks about the same ooThe only area he has on the right is the dorsal webspace first and second which is smaller. ooHe has a large area of dry erythematous skin on the left buttock small open area here. 11/13/2018; the patient arrives  in much better condition. The erythema around the wound on the left lateral calf is a lot better. Not sure whether this was the clindamycin or the TCA and ketoconazole or just in the improvement in edema control [stasis dermatitis]. In any case this is a lot better. The area on the left heel is very small and just about resolved using silver collagen we have been using silver alginate to the areas on his dorsal feet 11/20/2018; his wounds include the left lateral calf, left heel, dorsal aspects of both feet just proximal to the first second webspace. He is stable to slightly improved. I did not think any changes to  his dressings were going to be necessary 11/27/2018 he has a reopening on the left buttock which is surrounded by what looks like tinea or perhaps some other form of dermatitis. The area on the left dorsal foot has some erythema around it I have marked this area but I am not sure whether this is cellulitis or not. Left heel is not closed. Left calf the reopening is really slightly longer and probably worse 1/13; in general things look better and smaller except for the left dorsal foot. Area on the left heel is just about closed, left buttock looks better only a small wound remains in the skin looks better [using Lotrisone] 1/20; the area on the left heel only has a few remaining open areas here. Left lateral calf about the same in terms of size, left dorsal foot slightly larger right lateral foot still not closed. The area on the left buttock has no open wound and the surrounding skin looks a lot better 1/27; the area on the left heel is closed. Left lateral calf better but still requiring extensive debridements. The area on his left buttock is closed. He still has the open areas on the left dorsal foot which is slightly smaller in the right foot which is slightly expanded. We have been using Iodoflex on these areas as well 2/3; left heel is closed. Left lateral calf still requiring  debridement using Iodoflex there is no open area on his left buttock however he has dry scaly skin over a large area of this. Not really responding well to the Lotrisone. Finally the areas on his dorsal feet at the level of the first second webspace are slightly smaller on the right and about the same on the left. Both of these vigorously debrided with Anasept and gauze 2/10; left heel remains closed he has dry erythematous skin over the left buttock but there is no open wound here. Left lateral leg has come in and with. Still requiring debridement we have been using Iodoflex here. Finally the area on the left dorsal foot and right dorsal foot are really about the same extremely dry callused fissured areas. He does not yet have a dermatology appointment 2/17; left heel remains closed. He has a new open area on the left buttock. The area on the left lateral calf is bigger longer and still covered in necrotic debris. No major change in his foot areas bilaterally. I am awaiting for a dermatologist to look on this. We have been using ketoconazole I do not know that this is been doing any good at all. 2/24; left heel remains closed. The left buttock wound that was new reopening last week looks better. The left lateral calf appears better also although still requires debridement. The major area on his foot is the left first second also requiring debridement. We have been putting Prisma on all wounds. I do not believe that the ketoconazole has done too much good for his feet. He will use Lotrisone I am going to give him a 2-week course of terbinafine. We still do not have a dermatology appointment 3/2 left heel remains closed however there is skin over bone in this area I pointed this out to him today. The left buttock wound is epithelialized but still does not look completely stable. The area on the left leg required debridement were using silver collagen here. With regards to his feet we changed to  Lotrisone last week and silver alginate. 3/9; left heel remains closed. Left buttock remains closed.  The area on the right foot is essentially closed. The left foot remains unchanged. Slightly smaller on the left lateral calf. Using silver collagen to both of these areas 3/16-Left heel remains closed. Area on right foot is closed. Left lateral calf above the lateral malleolus open wound requiring debridement with easy bleeding. Left dorsal wound proximal to first toe also debrided. Left ischial area open new. Patient has been using Prisma with wrapping every 3 days. Dermatology appointment is apparently tomorrow.Patient has completed his terbinafine 2-week course with some apparent improvement according to him, there is still flaking and dry skin in his foot on the left 3/23; area on the right foot is reopened. The area on the left anterior foot is about the same still a very necrotic adherent surface. He still has the area on the left leg and reopening is on the left buttock. He apparently saw dermatology although I do not have a note. According to the patient who is usually fairly well informed they did not have any good ideas. Put him on oral terbinafine which she is been on before. 3/30; using silver collagen to all wounds. Apparently his dermatologist put him on doxycycline and rifampin presumably some culture grew staph. I do not have this result. He remains on terbinafine although I have used terbinafine on him before 4/6; patient has had a fairly substantial reopening on the right foot between the first and second toes. He is finished his terbinafine and I believe is on doxycycline and rifampin still as prescribed by dermatology. We have been using silver collagen to all his wounds although the patient reports that he thinks silver alginate does better on the wounds on his buttock. 4/13; the area on his left lateral calf about the same size but it did not require debridement. ooLeft dorsal  foot just proximal to the webspace between the first and second toes is about the same. Still nonviable surface. I note some superficial bronze discoloration of the dorsal part of his foot ooRight dorsal foot just proximal to the first and second toes also looks about the same. I still think there may be the same discoloration I noted above on the left ooLeft buttock wound looks about the same 4/20; left lateral calf appears to be gradually contracting using silver collagen. 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 The left dorsal foot wound is debrided of tightly adherent necrotic debris and really cleans up quite nicely. The right area is worse with expansion. I did not debride this it is now over the base of the second toe ooThe area on his left buttock is smaller no debridement is required using silver collagen 5/4; left calf continues to make good progress. ooHe arrives with erythema around the wounds on his dorsal foot which even extends to the plantar aspect. Very concerning for coexistent infection. He is finished the erythromycin I gave him for possible erythrasma this does not seem to have helped. ooThe area on the left foot is about the same base of the dorsal toes ooIs area on the buttock looks improved on the left 5/11; left calf and left buttock continued to make good progress. Left foot is about the same to slightly improved. ooMajor problem is on the right foot. He has not had an x-ray. Deep tissue culture I did last week showed both Enterobacter and E. coli. I did not change the doxycycline I put him on empirically although neither 1 of these were plated to  doxycycline. He arrives today with the erythema looking worse on both the dorsal and plantar foot. Macerated skin on the bottom of the foot. he has not been systemically unwell 5/18-Patient returns at 1 week, left calf wound appears to be making some progress,  left buttock wound appears slightly worse than last time, left foot wound looks slightly better, right foot redness is marginally better. X-ray of both feet show no air or evidence of osteomyelitis. Patient is finished his Omnicef and terbinafine. He continues to have macerated skin on the bottom of the left foot as well as right 5/26; left calf wound is better, left buttock wound appears to have multiple small superficial open areas with surrounding macerated skin. X-rays that I did last time showed no evidence of osteomyelitis in either foot. He is finished cefdinir and doxycycline. I do not think that he was on terbinafine. He continues to have a large superficial open area on the right foot anterior dorsal and slightly between the first and second toes. I did send him to dermatology 2 months ago or so wondering about whether they would do a fungal scraping. I do not believe they did but did do a culture. We have been using silver alginate to the toe areas, he has been using antifungals at home topically either ketoconazole or Lotrisone. We are using silver collagen on the left foot, silver alginate on the right, silver collagen on the left lateral leg and silver alginate on the left buttock 6/1; left buttock area is healed. We have the left dorsal foot, left lateral leg and right dorsal foot. We are using silver alginate to the areas on both feet and silver collagen to the area on his left lateral calf 6/8; the left buttock apparently reopened late last week. He is not really sure how this happened. He is tolerating the terbinafine. Using silver alginate to all wounds 6/15; left buttock wound is larger than last week but still superficial. ooCame in the clinic today with a report of purulence from the left lateral leg I did not identify any infection ooBoth areas on his dorsal feet appear to be better. He is tolerating the terbinafine. Using silver alginate to all wounds 6/22; left buttock is  about the same this week, left calf quite a bit better. His left foot is about the same however he comes in with erythema and warmth in the right forefoot once again. Culture that I gave him in the beginning of May showed Enterobacter and E. coli. I gave him doxycycline and things seem to improve although neither 1 of these organisms was specifically plated. 6/29; left buttock is larger and dry this week. Left lateral calf looks to me to be improved. Left dorsal foot also somewhat improved right foot completely unchanged. The erythema on the right foot is still present. He is completing the Ceftin dinner that I gave him empirically [see discussion above.) 7/6 - All wounds look to be stable and perhaps improved, the left buttock wound is slightly smaller, per patient bleeds easily, completed ceftin, the right foot redness is less, he is on terbinafine 7/13; left buttock wound about the same perhaps slightly narrower. Area on the left lateral leg continues to narrow. Left dorsal foot slightly smaller right foot about the same. We are using silver alginate on the right foot and Hydrofera Blue to the areas on the left. Unna boot on the left 2 layer compression on the right 7/20; left buttock wound absolutely the same. Area on  lateral leg continues to get better. Left dorsal foot require debridement as did the right no major change in the 7/27; left buttock wound the same size necrotic debris over the surface. The area on the lateral leg is closed once again. His left foot looks better right foot about the same although there is some involvement now of the posterior first second toe area. He is still on terbinafine which I have given him for a month, not certain a centimeter major change 06/25/19-All wounds appear to be slightly improved according to report, left buttock wound looks clean, both foot wounds have minimal to no debris the right dorsal foot has minimal slough. We are using Hydrofera Blue to the  left and silver alginate to the right foot and ischial wound. 8/10-Wounds all appear to be around the same, the right forefoot distal part has some redness which was not there before, however the wound looks clean and small. Ischial wound looks about the same with no changes 8/17; his wound on the left lateral calf which was his original chronic venous insufficiency wound remains closed. Since I last saw him the areas on the left dorsal foot right dorsal foot generally appear better but require debridement. The area on his left initial tuberosity appears somewhat larger to me perhaps hyper granulated and bleeds very easily. We have been using Hydrofera Blue to the left dorsal foot and silver alginate to everything else 8/24; left lateral calf remains closed. The areas on his dorsal feet on the webspace of the first and second toes bilaterally both look better. The area on the left buttock which is the pressure ulcer stage II slightly smaller. I change the dressing to Hydrofera Blue to all areas 8/31; left lateral calf remains closed. The area on his dorsal feet bilaterally look better. Using Hydrofera Blue. Still requiring debridement on the left foot. No change in the left buttock pressure ulcers however 9/14; left lateral calf remains closed. Dorsal feet look quite a bit better than 2 weeks ago. Flaking dry skin also a lot better with the ammonium lactate I gave him 2 weeks ago. The area on the left buttock is improved. He states that his Roho cushion developed a leak and he is getting a new one, in the interim he is offloading this vigorously 9/21; left calf remains closed. Left heel which was a possible DTI looks better this week. He had macerated tissue around the left dorsal foot right foot looks satisfactory and improved left buttock wound. I changed his dressings to his feet to silver alginate bilaterally. Continuing Hydrofera Blue on the left buttock. 9/28 left calf remains closed. Left heel  did not develop anything [possible DTI] dry flaking skin on the left dorsal foot. Right foot looks satisfactory. Improved left buttock wound. We are using silver alginate on his feet Hydrofera Blue on the buttock. I have asked him to go back to the Lotrisone on his feet including the wounds and surrounding areas 10/5; left calf remains closed. The areas on the left and right feet about the same. A lot of this is epithelialized however debris over the remaining open areas. He is using Lotrisone and silver alginate. The area on the left buttock using Hydrofera Blue 10/26. Patient has been out for 3 weeks secondary to Covid concerns. He tested negative but I think his wife tested positive. He comes in today with the left foot substantially worse, right foot about the same. Even more concerning he states that the area on  his left buttock closed over but then reopened and is considerably deeper in one aspect than it was before [stage III wound] 11/2; left foot really about the same as last week. Quarter sized wound on the dorsal foot just proximal to the first second toes. Surrounding erythema with areas of denuded epithelium. This is not really much different looking. Did not look like cellulitis this time however. ooRight foot area about the same.. We have been using silver alginate alginate on his toes ooLeft buttock still substantial irritated skin around the wound which I think looks somewhat better. We have been using Hydrofera Blue here. 11/9; left foot larger than last week and a very necrotic surface. Right foot I think is about the same perhaps slightly smaller. Debris around the circumference also addressed. Unfortunately on the left buttock there is been a decline. Satellite lesions below the major wound distally and now a an additional one posteriorly we have been using Hydrofera Blue but I think this is a pressure issue 11/16; left foot ulcer dorsally again a very adherent necrotic surface.  Right foot is about the same. Not much change in the pressure ulcer on his left buttock. 11/30; left foot ulcer dorsally basically the same as when I saw him 2 weeks ago. Very adherent fibrinous debris on the wound surface. Patient reports a lot of drainage as well. 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 We have been using Iodoflex, patient changed back to alginate because of the drainage. Area on his right dorsal foot really looks benign with a healthier surface certainly a lot better than on the left. Left buttock wounds all improved using Hydrofera Blue 12/7; left dorsal foot again no improvement. Tightly adherent debris. PCR culture I did last week only showed likely skin contaminant. I have gone ahead and done a punch biopsy of this which is about the last thing in terms of investigations I can think to do. He has known venous insufficiency and venous hypertension and this could be the issue here. The area on the right foot is about the same left buttock slightly worse according to our intake nurse secondary to [facility] Blue sticking to the wound 12/14; biopsy of the left foot that I did last time showed changes that could be related to wound healing/chronic stasis dermatitis phenomenon no neoplasm. We have been using silver alginate to both feet. I change the one on the left today to Sorbact and silver alginate to his other 2 wounds 12/28; the patient arrives with the following problems; ooMajor issue is the dorsal left foot which continues to be a larger deeper wound area. Still with a completely nonviable surface ooParadoxically the area mirror image on the right on the right dorsal foot appears to be getting better. ooHe had some loss of dry denuded skin from the lower part of his original wound on the left lateral calf. Some of this area looked a little vulnerable and for this reason we put him in wrap that on this side this  week ooThe area on his left buttock is larger. He still has the erythematous circular area which I think is a combination of pressure, sweat. This does not look like cellulitis or fungal dermatitis 11/26/2019; -Dorsal left foot large open wound with depth. Still debris over the surface. Using Sorbact ooThe area on the dorsal right foot paradoxically has closed over [facility] has a reopening on the left ankle laterally at the base of his original  wound that extended up into the calf. This appears clean. ooThe left buttock wound is smaller but with very adherent necrotic debris over the surface. We have been using silver alginate here as well The patient had arterial studies done in 2017. He had biphasic waveforms at the dorsalis pedis and posterior tibial bilaterally. ABI in the left was 1.17. Digit waveforms were dampened. He has slight spasticity in the great toes I do not think a TBI would be possible 1/11; the patient comes in today with a sizable reopening between the first and second toes on the right. This is not exactly in the same location where we have been treating wounds previously. According to our intake nurse this was actually fairly deep but 0.6 cm. The area on the left dorsal foot looks about the same the surface is somewhat cleaner using Sorbact, his MRI is in 2 days. We have not managed yet to get arterial studies. The new reopening on the left lateral calf looks somewhat better using alginate. The left buttock wound is about the same using alginate 1/18; the patient had his ARTERIAL studies which were quite normal. ABI in the right at 1.13 with triphasic/biphasic waveforms on the left ABI 1.06 again with triphasic/biphasic waveforms. It would not have been possible to have done a toe brachial index because of spasticity. We have been using Sorbac to the left foot alginate to the rest of his wounds on the right foot left lateral calf and left buttock 1/25; arrives in clinic with  erythema and swelling of the left forefoot worse over the first MTP area. This extends laterally dorsally and but also posteriorly. Still has an area on the left lateral part of the lower part of his calf wound it is eschared and clearly not closed. ooArea on the left buttock still with surrounding irritation and erythema. ooRight foot surface wound dorsally. The area between the right and first and second toes appears better. 2/1; ooThe left foot wound is about the same. Erythema slightly better I gave him a week of doxycycline empirically ooRight foot wound is more extensive extending between the toes to the plantar surface ooLeft lateral calf really no open surface on the inferior part of his original wound however the entire area still looks vulnerable ooAbsolutely no improvement in the left buttock wound required debridement. 2/8; the left foot is about the same. Erythema is slightly improved I gave him clindamycin last week. ooRight foot looks better he is using Lotrimin and silver alginate ooHe has a breakdown in the left lateral calf. Denuded epithelium which I have removed ooLeft buttock about the same were using Hydrofera Blue 2/15; left foot is about the same there is less surrounding erythema. Surface still has tightly adherent debris which I have debriding however not making any progress ooRight foot has a substantial wound on the medial right second toe between the first and second webspace. ooStill an open area on the left lateral calf distal area. ooButtock wound is about the same 2/22; left foot is about the same less surrounding erythema. Surface has adherent debris. Polymen Ag Right foot area significant wound between the first and second toes. We have been using silver alginate here Left lateral leg polymen Ag at the base of his original venous insufficiency wound ooLeft buttock some improvement here 3/1; ooRight foot is deteriorating in the first second toe  webspace. Larger and more substantial. We have been using silver alginate. ooLeft dorsal foot about the same markedly adherent surface debris  using PolyMem Ag ooLeft lateral calf surface debris using PolyMem AG ooLeft buttock is improved again using PolyMem Ag. ooHe is completing his terbinafine. The erythema in the foot seems better. He has been on this for 2 weeks 3/8; no improvement in any wound area in fact he has a small open area on the dorsal midfoot which is new this week. He has not gotten his foot x-rays yet 3/15; his x-rays were both negative for osteomyelitis of both feet. No major change in any of his wounds on the extremities however his buttock wounds are better. We have been using polymen on the buttocks, left lower leg. Iodoflex on the left foot and silver alginate on the right 3/22; arrives in clinic today with the 2 major issues are the improvement in the left dorsal foot wound which for once actually looks healthy with a nice healthy wound surface without debridement. Using Iodoflex here. Unfortunately on the left lateral calf which is in the distal part of his original wound he came to the clinic here for there was purulent drainage noted some increased breakdown scattered around the original area and a small area proximally. We we are using polymen here will change to silver alginate today. His buttock wound on the left is better and I think the area on the right first second toe webspace is also improved 3/29; left dorsal foot looks better. Using Iodoflex. Left ankle culture from deterioration last time grew E. coli, Enterobacter and Enterococcus. I will give him a course of cefdinir although that will not cover Enterococcus. The area on the right foot in the webspace of the first and second toe lateral first toe looks better. The area on his buttock is about healed Vascular appointment is on April 21. This is to look at his venous system vis--vis continued breakdown of  the wounds on the left including the left lateral leg and left dorsal foot he. He has had previous ablations on this side 4/5; the area between the right first and second toes lateral aspect of the first toe looks better. Dorsal aspect of the left first toe on the left foot also improved. Unfortunately the left lateral lower leg is larger and there is a second satellite wound superiorly. The usual superficial abrasions on the left buttock overall better but certainly not closed 4/12; the area between the right first and second toes is improved. Dorsal aspect of the left foot also slightly smaller with a vibrant healthy looking surface. No real change in the left lateral leg and the left buttock wound is healed He has an unaffordable co-pay for Apligraf. Appointment with vein and vascular with regards to the left leg venous part of the circulation is on 4/21 4/19; we continue to see improvement in all wound areas. Although this is minor. He has his vascular appointment on 4/21. The area on the left buttock has not reopened although right in the center of this area the skin looks somewhat threatened 4/26; the left buttock is unfortunately reopened. In general his left dorsal foot has a healthy surface and looks somewhat smaller although it was not measured as such. The area between his first and second toe webspace on the right as a small wound against the first toe. The patient saw vascular surgery. The real question I was asking was about the small saphenous vein on the left. He has previously ablated left greater saphenous vein. Nothing further was commented on on the left. Right greater saphenous vein without reflux  at the saphenofemoral junction or proximal thigh there was no indication for ablation of the right greater saphenous vein duplex was negative for DVT bilaterally. They did not think there was anything from a vascular surgery point of view that could be offered. They ABIs within normal  limits 5/3; only small open area on the left buttock. The area on the left lateral leg which was his original venous reflux is now 2 wounds both which look clean. We are using Iodoflex on the left dorsal foot which looks healthy and smaller. He is down to a very tiny area between the right first and second toes, using silver alginate 5/10; all of his wounds appear better. We have much better edema control in 4 layer compression on the left. This may be the factor that is allowing the left foot and left lateral calf to heal. He has external compression garments at home 04/14/20-All of his wounds are progressing well, the left forefoot is practically closed, left ischium appears to be about the same, right toe webspace is also smaller. The left lateral leg is about the same, continue using Hydrofera Blue to this, silver alginate to the ischium, Iodoflex to the toe space on the right 6/7; most of his wounds outside of the left buttock are doing well. The area on the left lateral calf and left dorsal foot are smaller. The area on the right foot in between the first and second toe webspace is barely visible although he still says there is some drainage here is the only reason I did not heal this out. ooUnfortunately the area on the left buttock almost looks like he has a skin tear from tape. He has open wound and then a large flap of skin that we are trying to get adherence over an area just next to the remaining wound 6/21; 2 week follow-up. I believe is been here for nurse visits. Miraculously the area between his first and second toes on the left dorsal foot is closed over. Still open on the right first second web space. The left lateral calf has 2 open areas. Distally this is more superficial. The proximal area had a little more depth and required debridement of adherent necrotic material. His buttock wound is actually larger we have been using silver alginate here 6/28; the patient's area on the left  foot remains closed. Still open wet area between the first and second toes on the right and also extending into the plantar aspect. We have been using silver alginate in this location. He has 2 areas on the left lower leg part of his original long wounds which I think are better. We have been using Hydrofera Blue here. Hydrofera Blue to the left buttock which is stable 7/12; left foot remains closed. Left ankle is closed. May be a small area between his right first and second toes the only truly open area is on the left buttock. We have been using Hydrofera Blue here 7/19; patient arrives with marked deterioration especially in the left foot and ankle. We did not put him in a compression wrap on the left last week in fact he wore his juxta lite stockings on either side although he does not have an underlying stocking. He has a reopening on the left dorsal foot, left lateral ankle and a new area on the right dorsal ankle. More worrisome is the degree of erythema on the left foot extending on the lateral foot into the lateral lower leg on the left 7/26;  the patient had erythema and drainage from the lateral left ankle last week. Culture of this grew MRSA resistant to doxycycline and clindamycin which are the 2 antibiotics we usually use with this patient who has multiple antibiotic allergies including linezolid, trimethoprim sulfamethoxazole. I had give him an empiric doxycycline and he comes in the area certainly looks somewhat better although it is blotchy in his lower leg. He has not been systemically unwell. He has had areas on the left dorsal foot which is a reopening, chronic wounds on the left lateral ankle. Both of these I think are secondary to chronic venous insufficiency. The area between his first and second toes is closed as far as I can tell. He had a new wrap injury on the right dorsal ankle last week. Finally he has an area on the left buttock. We have been using silver alginate to  everything except the left buttock we are using Hydrofera Blue 06/30/20-Patient returns at 1 week, has been given a sample dose pack of NUZYRA which is a tetracycline derivative [omadacycline], patient has completed those, we have been using silver alginate to almost all the wounds except the left ischium where we are using Hydrofera Blue all of them look better 8/16; since I last saw the patient he has been doing well. The area on the left buttock, left lateral ankle and left foot are all closed today. He has completed the [place] I gave him last time and tolerated this well. He still has open areas on the right dorsal ankle and in the right first second toe area which we are using silver alginate. 8/23; we put him in his bilateral external compression stockings last week as he did not have anything open on either leg except for concerning area between the right first and second toe. He comes in today with an area on the left dorsal foot slightly more proximal than the original wound, the left lateral foot but this is actually a continuation of the area he had on the left lateral ankle from last time. As well he is opened up on the left buttock again. 8/30; comes in today with things looking a lot better. The area on the left lower ankle has closed down as has the left foot but with eschar in both areas. The area on the dorsal right ankle is also epithelialized. Very little remaining of the left buttock wound. We have been using silver alginate on all wound areas 9/13; the area in the first second toe webspace on the right has fully epithelialized. He still has some vulnerable epithelium on the right and the ankle and the dorsal foot. He notes weeping. He is using his juxta lite stocking. On the left again the left dorsal foot is closed left lateral ankle is closed. We went to the juxta lite stocking here as well. ooStill vulnerable in the left buttock although only 2 small open areas remain here 9/27;  2-week follow-up. We did not look at his left leg but the patient says everything is closed. He is a bit disturbed by the amount of edema in his left foot he is using juxta lite stockings but asking about over the toes stockings which would be 30/40, will talk to him next time. According to him there is no open wound on either the left foot or the left ankle/calf He has an open area on the dorsal right calf which I initially point a wrap injury. He has superficial remaining wound on the left  ischial tuberosity been using silver alginate although he says this sticks to the wound 10/5; we gave him 2-week follow-up but he called yesterday expressing some concerns about his right foot right ankle and the left buttock. He came in early. There is still no open areas on the left leg and that still in his juxta lite stocking 10/11; he only has 1 small area on the left buttock that remains measuring millimeters 1 mm. Still has the same irritated skin in this area. We recommended zinc oxide when this eventually closes and pressure relief is meticulously is he can do this. He still has an area on the dorsal part of his right first through third toes which is a bit irritated and still open and on the dorsal ankle near the crease of the ankle. We have been using silver alginate and using his own stocking. He has nothing open on the left leg or foot 10/25; 2-week follow-up. Not nearly as good on the left buttock as I was hoping. For open areas with 5 looking threatened small. He has the erythematous irritated chronic skin in this area. oo1 area on the right dorsal ankle. He reports this area bleeds easily ooRight dorsal foot just proximal to the base of his toes ooWe have been using silver alginate. 11/8; 2-week follow-up. Left buttock is about the same although I do not think the wounds are in the same location we have been using silver alginate. I have asked him to use zinc oxide on the skin around the  wounds. ooHe still has a small area on the right dorsal ankle he reports this bleeds easily ooRight dorsal foot just proximal to the base of the toes does not have anything open although the skin is very dry and scaly ooHe has a new opening on the nailbed of the left great toe. Nothing on the left ankle 11/29; 3-week follow-up. Left buttock has 2 open areas. And washing of these wounds today started bleeding easily. Suggesting very friable tissue. We have been using silver alginate. Right dorsal ankle which I thought was initially a wrap injury we have been using silver alginate. Nothing open between the toes that I can see. He states the area on the left dorsal toe nailbed healed after the last visit in 2 or 3 days Objective Constitutional Patient is hypertensive.. Pulse regular and within target range for patient.Marland Kitchen Respirations regular, non-labored and within target range.. Temperature is normal and within the target range for the patient.Marland Kitchen Appears in no distress. Vitals Time Taken: 7:55 AM, Height: 70 in, Weight: 216 lbs, BMI: 31, Temperature: 98.3 F, Pulse: 123 bpm, Respiratory Rate: 18 breaths/min, Blood Pressure: 142/90 mmHg. General Notes: Wound exam; ooLeft buttock he has 2 open areas. Very friable granulation which bled with simply wiping with gauze. I used silver nitrate on this to cauterize this tissue will change the primary dressing to polymen - Right dorsal ankle have not been able to make much progress here. Aggressive debridement with a #3 curette removing skin callus and subcutaneous tissue hemostasis with direct pressure ooThe webspace of his first and second toes on the right foot as well as the dorsal and plantar first and second toes all look as though they are closing. Integumentary (Hair, Skin) Wound #38R status is Open. Original cause of wound was Gradually Appeared. The wound is located on the Right T - Web between 1st and 2nd. The wound oe measures 0.3cm length x  0.5cm width x 0.1cm depth; 0.118cm^2 area and  0.012cm^3 volume. There is Fat Layer (Subcutaneous Tissue) exposed. There is no tunneling or undermining noted. There is a small amount of serous drainage noted. The wound margin is flat and intact. There is medium (34-66%) pink granulation within the wound bed. There is a medium (34-66%) amount of necrotic tissue within the wound bed including Adherent Slough. Wound #41R status is Open. Original cause of wound was Gradually Appeared. The wound is located on the Left Ischium. The wound measures 6cm length x 2cm width x 0.1cm depth; 9.425cm^2 area and 0.942cm^3 volume. There is Fat Layer (Subcutaneous Tissue) exposed. There is no tunneling or undermining noted. There is a medium amount of serosanguineous drainage noted. The wound margin is distinct with the outline attached to the wound base. There is large (67-100%) red, friable granulation within the wound bed. There is a small (1-33%) amount of necrotic tissue within the wound bed including Adherent Slough. Wound #46 status is Open. Original cause of wound was Gradually Appeared. The wound is located on the Right,Dorsal Ankle. The wound measures 1cm length x 0.8cm width x 0.2cm depth; 0.628cm^2 area and 0.126cm^3 volume. There is Fat Layer (Subcutaneous Tissue) exposed. There is no tunneling or undermining noted. There is a medium amount of serosanguineous drainage noted. The wound margin is flat and intact. There is large (67-100%) red granulation within the wound bed. There is a small (1-33%) amount of necrotic tissue within the wound bed including Adherent Slough. 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 There was a Excisional Skin/Subcutaneous Tissue Debridement with a total area of 0.8 sq cm performed by Ricard Dillon., MD. With the following instrument(s): Curette to remove Viable and Non-Viable tissue/material. Material removed includes Subcutaneous Tissue, Slough, and Skin: Epidermis. No specimens were taken. A time out was conducted at 08:23, prior to the start of the procedure. A Minimum amount of bleeding was controlled with Pressure. The procedure was tolerated well with a pain level of 0 throughout and a pain level of 0 following the procedure. Post Debridement Measurements: 1cm length x 0.8cm width x 0.2cm depth; 0.126cm^3 volume. Character of Wound/Ulcer Post Debridement is improved. Post procedure Diagnosis Wound #46: Same as Pre-Procedure Wound #41R Pre-procedure diagnosis of Wound #41R is a Pressure Ulcer located on the Left Ischium . An Chemical Cauterization procedure was performed by Ricard Dillon., MD. Post procedure Diagnosis Wound #41R: Same as Pre-Procedure Notes: silver nitrate Plan Follow-up Appointments: Return Appointment in 2 weeks. Dressing Change Frequency: Wound #38R Right T - Web between 1st and 2nd: oe Change Dressing every other day. Wound #41R Left Ischium: Change Dressing every other day. Wound #46 Right,Dorsal Ankle: Change Dressing every other day. Skin Barriers/Peri-Wound Care: Moisturizing lotion - both legs daily Other: - lotrimen to periwound of right foot webbing 1st / 2nd toe Wound Cleansing: May shower and wash wound with soap and water. - on days that dressing is changed Primary Wound Dressing: Wound #38R Right T - Web between 1st and 2nd: oe Calcium Alginate  with Silver Wound #41R Left Ischium: Polymem Wound #46 Right,Dorsal Ankle: Hydrofera Blue - Ready Secondary Dressing: Wound #38R Right T - Web between 1st and 2nd: oe Kerlix/Rolled Gauze Dry Gauze Wound #41R Left Ischium: Foam Border - or ABD pad and tape Wound #46 Right,Dorsal Ankle: Foam Border Edema Control: Elevate legs to the level of the heart or above for 30 minutes daily and/or when sitting, a frequency of: - throughout the day Support Garment 30-40 mm/Hg pressure to: - Juxtalite to both legs daily Off-Loading: Low air-loss mattress (Group 2) Roho cushion for wheelchair Turn and reposition every 2 hours - out of wheelchair throughout the day, try to lay on sides, sleep in the bed not the recliner 1. Polymen to the left ischium which is a change. I used silver nitrate to the surface of both wounds hoping to cauterize friable tissue 2. Continued with Hydrofera Blue to the right ankle after an aggressive debridement. 3. There is nothing much open on the right foot dorsal and plantar as well as the first second toe interspace although that skin here does not look particularly healthy dry and flaking they are using some form of moisturizer Electronic Signature(s) Signed: 10/20/2020 4:47:57 PM By: Linton Ham MD Entered By: Linton Ham on 10/20/2020 08:49:08 -------------------------------------------------------------------------------- SuperBill Details Patient Name: Date of Service: Crus[place], [name] 10/20/2020 Medical Record Number: [id number] Patient Account Number: [id number] Date of Birth/Sex: Treating RN: July 04, 1986 (34 y.o. [name] Primary Care Provider: [place], [place] Other Clinician: Referring Provider: Treating Provider/Extender: [name]: 250 Diagnosis Coding ICD-10 Codes Code Description [phone] Chronic venous hypertension (idiopathic) with ulcer and inflammation of left lower extremity L97.321  Non-pressure chronic ulcer of left ankle limited to breakdown of skin L97.311 Non-pressure chronic ulcer of right ankle limited to breakdown of skin L97.511 Non-pressure chronic ulcer of other part of right foot limited to breakdown of skin L97.521 Non-pressure chronic ulcer of other part of left foot limited to breakdown of 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, 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, complete Facility Procedures CPT4 Code: [id number] Description: 92010 - DEB SUBQ TISSUE 20 SQ CM/< ICD-10 Diagnosis Description O71.219 Non-pressure chronic ulcer of right ankle limited to breakdown of skin Modifier: Quantity: 1 CPT4 Code: [id number] Description: 98264 - CHEM CAUT GRANULATION TISS ICD-10 Diagnosis Description 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, stage 3 Modifier: 59 Quantity: 1 Physician Procedures : CPT4 Code Description Modifier [id number] 11042 - WC PHYS SUBQ TISS 20 SQ CM ICD-10 Diagnosis Description M76.808 Non-pressure chronic ulcer of right ankle limited to breakdown of skin Quantity: 1 : [id number] - WC PHYS CHEM CAUT GRAN TISSUE 59 ICD-10 Diagnosis Description 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, stage 3 Quantity: 1 Electronic Signature(s) Signed: 10/20/2020 4:47:57 PM By: Linton Ham MD Entered By: Linton Ham on 10/20/2020 08:50:01

## 2020-10-21 NOTE — Progress Notes (Signed)
[name] ([id number]) Visit Report for 10/20/2020 Arrival Information Details Patient Name: Date of Service: [name], [name]. 10/20/2020 7:30 A M Medical Record Number: [id number] Patient Account Number: [id number] Date of Birth/Sex: Treating RN: 06-20-1986 (34 y.o. [name]) Carlene Coria Primary Care Tylin Force: [place], [place] Other Clinician: Referring Gitty Osterlund: Treating Ulla Mckiernan/Extender: [name]: 250 Visit Information History Since Last Visit All ordered tests and consults were completed: No Patient Arrived: Wheel Chair Added or deleted any medications: No Arrival Time: 07:54 Any new allergies or adverse reactions: No Accompanied By: self Had a fall or experienced change in No Transfer Assistance: None activities of daily living that may affect Patient Identification Verified: Yes risk of falls: Secondary Verification Process Completed: Yes Signs or symptoms of abuse/neglect since last visito No Patient Requires Transmission-Based Precautions: No Hospitalized since last visit: No Patient Has Alerts: Yes Implantable device outside of the clinic excluding No Patient Alerts: R ABI = 1.0 cellular tissue based products placed in the center L ABI = 1.1 since last visit: Has Dressing in Place as Prescribed: Yes Pain Present Now: No Electronic Signature(s) Signed: 10/21/2020 5:56:00 PM By: Carlene Coria RN Entered By: Carlene Coria on 10/20/2020 07:55:11 -------------------------------------------------------------------------------- Encounter Discharge Information Details Patient Name: Date of Service: Boye, [name] 10/20/2020 7:30 A M Medical Record Number: [id number] Patient Account Number: [id number] Date of Birth/Sex: Treating RN: 1986-01-17 (34 y.o. [name] Primary Care Rodell Marrs: [place], [place] Other Clinician: Referring Diondre Pulis: Treating Paxton Kanaan/Extender: [name]: 250 Encounter  Discharge Information Items Post Procedure Vitals Discharge Condition: Stable Temperature (F): 98.3 Ambulatory Status: Wheelchair Pulse (bpm): 123 Discharge Destination: Home Respiratory Rate (breaths/min): 18 Transportation: Private Auto Blood Pressure (mmHg): 142/90 Accompanied By: self Schedule Follow-up Appointment: Yes Clinical Summary of Care: Electronic Signature(s) Signed: 10/20/2020 5:09:36 PM By: Deon Pilling Entered By: Deon Pilling on 10/20/2020 08:45:53 -------------------------------------------------------------------------------- Lower Extremity Assessment Details Patient Name: Date of Service: [name], [name] 10/20/2020 7:30 A M Medical Record Number: [id number] Patient Account Number: [id number] Date of Birth/Sex: Treating RN: 1986-07-02 (34 y.o. [name] Primary Care Niharika Savino: [place], [place] Other Clinician: Referring Bedelia Pong: Treating Sherene Plancarte/Extender: [name]: 250 Edema Assessment Assessed: [Left: No] [Right: No] Edema: [Left: Ye] [Right: s] Calf Left: Right: Point of Measurement: 33 cm From Medial Instep 33 cm Ankle Left: Right: Point of Measurement: 10 cm From Medial Instep 23 cm Electronic Signature(s) Signed: 10/21/2020 5:56:00 PM By: Carlene Coria RN Entered By: Carlene Coria on 10/20/2020 08:05:09 -------------------------------------------------------------------------------- Multi Wound Chart Details Patient Name: Date of Service: [name], [name] 10/20/2020 7:30 A M Medical Record Number: [id number] Patient Account Number: [id number] Date of Birth/Sex: Treating RN: 1986-10-18 (34 y.o. [name] Primary Care Ishia Tenorio: [name], [name] Other Clinician: Referring Ever Gustafson: Treating Munir Victorian/Extender: Dutch Gray, [name]: 250 Vital Signs Height(in): 70 Pulse(bpm): 123 Weight(lbs): 216 Blood Pressure(mmHg): 142/90 Body Mass Index(BMI):  31 Temperature(F): 98.3 Respiratory Rate(breaths/min): 18 Photos: [38R:No Photos Right T - Web between 1st and 2nd Left Ischium oe] [41R:No Photos] [46:No Photos Right, Dorsal Ankle] Wound Location: [38R:Gradually Appeared] [41R:Gradually Appeared] [46:Gradually Appeared] Wounding Event: [38R:Inflammatory] [41R:Pressure Ulcer] [46:Abrasion] Primary Etiology: [38R:Sleep Apnea, Hypertension, Paraplegia Sleep Apnea, Hypertension, Paraplegia Sleep Apnea, Hypertension, Paraplegia] Comorbid History: [38R:11/30/2019] [41R:03/16/2020] [46:08/13/2020] Date Acquired: [38R:46] [41R:31] [46:9] Weeks of Treatment: [38R:Open] [41R:Open] [46:Open] Wound Status: [38R:Yes] [41R:Yes] [46:No] Wound Recurrence: [38R:No] [41R:Yes] [46:No] Clustered Wound: [38R:N/A] [41R:3] [46:N/A] Clustered Quantity: [38R:0.3x0.5x0.1] [41R:6x2x0.1] [46:1x0.8x0.2] Measurements  L x W x D (cm) [38R:0.118] [41R:9.425] [46:0.628] A (cm) : rea [38R:0.012] [41R:0.942] [46:0.126] Volume (cm) : [38R:64.20%] [41R:-421.90%] [46:-896.80%] % Reduction in A rea: [38R:94.80%] [41R:-420.40%] [46:-2000.00%] % Reduction in Volume: [38R:Full Thickness Without Exposed] [41R:Category/Stage II] [46:Full Thickness Without Exposed] Classification: [38R:Support Structures Small] [41R:Medium] [46:Support Structures Medium] Exudate Amount: [38R:Serous] [41R:Serosanguineous] [46:Serosanguineous] Exudate Type: [38R:amber] [41R:red, brown] [46:red, brown] Exudate Color: [38R:Flat and Intact] [41R:Distinct, outline attached] [46:Flat and Intact] Wound Margin: [38R:Medium (34-66%)] [41R:Large (67-100%)] [46:Large (67-100%)] Granulation Amount: [38R:Pink] [41R:Red, Friable] [46:Red] Granulation Quality: [38R:Medium (34-66%)] [41R:Small (1-33%)] [46:Small (1-33%)] Necrotic Amount: [38R:Fat Layer (Subcutaneous Tissue): Yes Fat Layer (Subcutaneous Tissue): Yes Fat Layer (Subcutaneous Tissue): Yes] Exposed Structures: [38R:Fascia: No Tendon: No Muscle: No  Joint: No Bone: No Large (67-100%)] [41R:Fascia: No Tendon: No Muscle: No Joint: No Bone: No Small (1-33%)] [46:Fascia: No Tendon: No Muscle: No Joint: No Bone: No Medium (34-66%)] Epithelialization: [38R:N/A] [41R:N/A] [46:Debridement - Excisional] Debridement: Pre-procedure Verification/Time Out N/A [41R:N/A] [46:08:23] Taken: [38R:N/A] [41R:N/A] [46:Subcutaneous, Slough] Tissue Debrided: [38R:N/A] [41R:N/A] [46:Skin/Subcutaneous Tissue] Level: [38R:N/A] [41R:N/A] [46:0.8] Debridement A (sq cm): [38R:rea N/A] [41R:N/A] [46:Curette] Instrument: [38R:N/A] [41R:N/A] [46:Minimum] Bleeding: [38R:N/A] [41R:N/A] [46:Pressure] Hemostasis A chieved: [38R:N/A] [41R:N/A] [46:0] Procedural Pain: [38R:N/A] [41R:N/A] [46:0] Post Procedural Pain: [38R:N/A] [41R:N/A] [46:Procedure was tolerated well] Debridement Treatment Response: [38R:N/A] [41R:N/A] [46:1x0.8x0.2] Post Debridement Measurements L x W x D (cm) [38R:N/A] [41R:N/A] [46:0.126] Post Debridement Volume: (cm) [38R:N/A] [41R:Chemical Cauterization] [46:Debridement] Treatment Notes Electronic Signature(s) Signed: 10/20/2020 4:47:57 PM By: Linton Ham MD Signed: 10/21/2020 6:12:47 PM By: Levan Hurst RN, BSN Entered By: Linton Ham on 10/20/2020 08:43:57 -------------------------------------------------------------------------------- Multi-Disciplinary Care Plan Details Patient Name: Date of Service: [place]on, [name] 10/20/2020 7:30 A M Medical Record Number: [id number] Patient Account Number: [id number] Date of Birth/Sex: Treating RN: 1986-03-30 (34 y.o. [name] Primary Care Berdia Lachman: [place], [place] Other Clinician: Referring Dexter Signor: Treating Lanny Lipkin/Extender: [name]: 250 Active Inactive Wound/Skin Impairment Nursing Diagnoses: Impaired tissue integrity Knowledge deficit related to ulceration/compromised skin integrity Goals: Patient/caregiver will verbalize  understanding of skin care regimen Date Initiated: 01/05/2016 Target Resolution Date: 10/31/2020 Goal Status: Active Ulcer/skin breakdown will have a volume reduction of 30% by week 4 Date Initiated: 01/05/2016 Date Inactivated: 12/22/2017 Target Resolution Date: 01/19/2018 Unmet Reason: complex wounds, Goal Status: Unmet infection Interventions: Assess patient/caregiver ability to obtain necessary supplies Assess ulceration(s) every visit Provide education on ulcer and skin care Notes: Electronic Signature(s) Signed: 10/21/2020 6:12:47 PM By: Levan Hurst RN, BSN Entered By: Levan Hurst on 10/20/2020 08:05:08 -------------------------------------------------------------------------------- Pain Assessment Details Patient Name: Date of Service: [place], [name] 10/20/2020 7:30 A M Medical Record Number: [id number] Patient Account Number: [id number] Date of Birth/Sex: Treating RN: Apr 04, 1986 (34 y.o. [name] Primary Care Nya Monds: [place], [place] Other Clinician: Referring Maury Bamba: Treating Toinette Lackie/Extender: [name]: 250 Active Problems Location of Pain Severity and Description of Pain Patient Has Paino No Site Locations Pain Management and Medication Current Pain Management: Electronic Signature(s) Signed: 10/21/2020 5:56:00 PM By: Carlene Coria RN Entered By: Carlene Coria on 10/20/2020 07:55:41 -------------------------------------------------------------------------------- Patient/Caregiver Education Details Patient Name: Date of Service: Dobkins, A Viviann Spare. 11/29/2021andnbsp7:30 A M Medical Record Number: [id number] Patient Account Number: [id number] Date of Birth/Gender: Treating RN: 1986-03-03 (34 y.o. [name] Primary Care Physician: Janine Limbo Other Clinician: Referring Physician: Treating Physician/Extender: [name]: 250 Education Assessment Education Provided  To: Patient Education Topics Provided Wound/Skin Impairment: Methods: Explain/Verbal Responses: State content correctly Electronic Signature(s)  Signed: 10/21/2020 6:12:47 PM By: Levan Hurst RN, BSN Entered By: Levan Hurst on 10/20/2020 08:05:22 -------------------------------------------------------------------------------- Wound Assessment Details Patient Name: Date of Service: [place], [name] 10/20/2020 7:30 A M Medical Record Number: [id number] Patient Account Number: [id number] Date of Birth/Sex: Treating RN: 09/22/1986 (34 y.o. [name]) Carlene Coria Primary Care Traycen Goyer: [name], [name] Other Clinician: Referring Gloriajean Okun: Treating Michael Ventresca/Extender: [name]: 250 Wound Status Wound Number: [id number] Primary Etiology: Inflammatory Wound Location: Right T - Web between 1st and 2nd oe Wound Status: Open Wounding Event: Gradually Appeared Comorbid History: Sleep Apnea, Hypertension, Paraplegia Date Acquired: 11/30/2019 Weeks Of Treatment: 46 Clustered Wound: No Wound Measurements Length: (cm) 0.3 Width: (cm) 0.5 Depth: (cm) 0.1 Area: (cm) 0.118 Volume: (cm) 0.012 % Reduction in Area: 64.2% % Reduction in Volume: 94.8% Epithelialization: Large (67-100%) Tunneling: No Undermining: No Wound Description Classification: Full Thickness Without Exposed Support Structures Wound Margin: Flat and Intact Exudate Amount: Small Exudate Type: Serous Exudate Color: amber Foul Odor After Cleansing: No Slough/Fibrino Yes Wound Bed Granulation Amount: Medium (34-66%) Exposed Structure Granulation Quality: Pink Fascia Exposed: No Necrotic Amount: Medium (34-66%) Fat Layer (Subcutaneous Tissue) Exposed: Yes Necrotic Quality: Adherent Slough Tendon Exposed: No Muscle Exposed: No Joint Exposed: No Bone Exposed: No Treatment Notes Wound #[id number] (Right Toe - Web between 1st and 2nd) 1. Cleanse With Wound Cleanser 2. Periwound Care Antifungal  cream Moisturizing lotion 3. Primary Dressing Applied Calcium Alginate Ag 4. Secondary Dressing Dry Gauze Roll Gauze 5. Secured With [place] tape Notes Development worker, international aid) Signed: 10/21/2020 5:56:00 PM By: Carlene Coria RN Entered By: Carlene Coria on 10/20/2020 08:06:49 -------------------------------------------------------------------------------- Wound Assessment Details Patient Name: Date of Service: [place], [name] 10/20/2020 7:30 A M Medical Record Number: [id number] Patient Account Number: [id number] Date of Birth/Sex: Treating RN: Jan 18, 1986 (34 y.o. [name]) Carlene Coria Primary Care Aleeha Boline: [name], [name] Other Clinician: Referring Annalei Friesz: Treating Mayleigh Tetrault/Extender: [name]: 250 Wound Status Wound Number: [id number] Primary Etiology: Pressure Ulcer Wound Location: Left Ischium Wound Status: Open Wounding Event: Gradually Appeared Comorbid History: Sleep Apnea, Hypertension, Paraplegia Date Acquired: 03/16/2020 Weeks Of Treatment: 31 Clustered Wound: Yes Wound Measurements Length: (cm) 6 Width: (cm) 2 Depth: (cm) 0.1 Clustered Quantity: 3 Area: (cm) 9.425 Volume: (cm) 0.942 % Reduction in Area: -421.9% % Reduction in Volume: -420.4% Epithelialization: Small (1-33%) Tunneling: No Undermining: No Wound Description Classification: Category/Stage II Wound Margin: Distinct, outline attached Exudate Amount: Medium Exudate Type: Serosanguineous Exudate Color: red, brown Foul Odor After Cleansing: No Slough/Fibrino Yes Wound Bed Granulation Amount: Large (67-100%) Exposed Structure Granulation Quality: Red, Friable Fascia Exposed: No Necrotic Amount: Small (1-33%) Fat Layer (Subcutaneous Tissue) Exposed: Yes Necrotic Quality: Adherent Slough Tendon Exposed: No Muscle Exposed: No Joint Exposed: No Bone Exposed: No Treatment Notes Wound #[id number] (Left Ischium) 1. Cleanse With Wound Cleanser 3. Primary  Dressing Applied Polymem 4. Secondary Dressing Foam Border Dressing 5. Secured With Office manager) Signed: 10/21/2020 5:56:00 PM By: Carlene Coria RN Entered By: Carlene Coria on 10/20/2020 08:07:11 -------------------------------------------------------------------------------- Wound Assessment Details Patient Name: Date of Service: [place], [name] 10/20/2020 7:30 A M Medical Record Number: [id number] Patient Account Number: [id number] Date of Birth/Sex: Treating RN: 1986/04/26 (34 y.o. [name]) Carlene Coria Primary Care Matty Deamer: [place], [place] Other Clinician: Referring Jayme Mednick: Treating Shatiqua Heroux/Extender: [name]: 250 Wound Status Wound Number: [id number] Primary Etiology: Abrasion Wound Location: Right, Dorsal Ankle Wound Status: Open Wounding Event: Gradually Appeared Comorbid History: Sleep  Apnea, Hypertension, Paraplegia Date Acquired: 08/13/2020 Weeks Of Treatment: 9 Clustered Wound: No Wound Measurements Length: (cm) 1 Width: (cm) 0.8 Depth: (cm) 0.2 Area: (cm) 0.628 Volume: (cm) 0.126 % Reduction in Area: -896.8% % Reduction in Volume: -2000% Epithelialization: Medium (34-66%) Tunneling: No Undermining: No Wound Description Classification: Full Thickness Without Exposed Support Structures Wound Margin: Flat and Intact Exudate Amount: Medium Exudate Type: Serosanguineous Exudate Color: red, brown Foul Odor After Cleansing: No Slough/Fibrino Yes Wound Bed Granulation Amount: Large (67-100%) Exposed Structure Granulation Quality: Red Fascia Exposed: No Necrotic Amount: Small (1-33%) Fat Layer (Subcutaneous Tissue) Exposed: Yes Necrotic Quality: Adherent Slough Tendon Exposed: No Muscle Exposed: No Joint Exposed: No Bone Exposed: No Treatment Notes Wound #[id number] (Right, Dorsal Ankle) 1. Cleanse With Wound Cleanser 3. Primary Dressing Applied Hydrofera Blue 4. Secondary Dressing Foam Border  Dressing 5. Secured With Office manager) Signed: 10/21/2020 5:56:00 PM By: Carlene Coria RN Entered By: Carlene Coria on 10/20/2020 08:07:38 -------------------------------------------------------------------------------- Vitals Details Patient Name: Date of Service: [place], [name] 10/20/2020 7:30 A M Medical Record Number: [id number] Patient Account Number: [id number] Date of Birth/Sex: Treating RN: [birth year] (34 y.o. [name]) Carlene Coria Primary Care Marcel Gary: [place], [place] Other Clinician: Referring Lorrie Gargan: Treating Rafel Garde/Extender: [name]: 250 Vital Signs Time Taken: 07:55 Temperature (F): 98.3 Height (in): 70 Pulse (bpm): 123 Weight (lbs): 216 Respiratory Rate (breaths/min): 18 Body Mass Index (BMI): 31 Blood Pressure (mmHg): 142/90 Reference Range: 80 - 120 mg / dl Electronic Signature(s) Signed: 10/21/2020 5:56:00 PM By: Carlene Coria RN Entered By: Carlene Coria on 10/20/2020 07:55:35

## 2020-10-31 DIAGNOSIS — R350 Frequency of micturition: Secondary | ICD-10-CM | POA: Diagnosis not present

## 2020-10-31 DIAGNOSIS — I10 Essential (primary) hypertension: Secondary | ICD-10-CM | POA: Diagnosis not present

## 2020-10-31 DIAGNOSIS — R635 Abnormal weight gain: Secondary | ICD-10-CM | POA: Diagnosis not present

## 2020-10-31 DIAGNOSIS — Z23 Encounter for immunization: Secondary | ICD-10-CM | POA: Diagnosis not present

## 2020-11-03 ENCOUNTER — Other Ambulatory Visit: Payer: Self-pay

## 2020-11-03 ENCOUNTER — Encounter (HOSPITAL_BASED_OUTPATIENT_CLINIC_OR_DEPARTMENT_OTHER): Payer: BC Managed Care – PPO | Attending: Internal Medicine | Admitting: Internal Medicine

## 2020-11-03 DIAGNOSIS — L97311 Non-pressure chronic ulcer of right ankle limited to breakdown of skin: Secondary | ICD-10-CM | POA: Diagnosis not present

## 2020-11-03 DIAGNOSIS — L97511 Non-pressure chronic ulcer of other part of right foot limited to breakdown of skin: Secondary | ICD-10-CM | POA: Diagnosis not present

## 2020-11-03 DIAGNOSIS — G8221 Paraplegia, complete: Secondary | ICD-10-CM | POA: Insufficient documentation

## 2020-11-03 DIAGNOSIS — I87322 Chronic venous hypertension (idiopathic) with inflammation of left lower extremity: Secondary | ICD-10-CM | POA: Insufficient documentation

## 2020-11-03 DIAGNOSIS — L97821 Non-pressure chronic ulcer of other part of left lower leg limited to breakdown of skin: Secondary | ICD-10-CM | POA: Diagnosis not present

## 2020-11-03 DIAGNOSIS — L97521 Non-pressure chronic ulcer of other part of left foot limited to breakdown of skin: Secondary | ICD-10-CM | POA: Diagnosis not present

## 2020-11-03 DIAGNOSIS — I87332 Chronic venous hypertension (idiopathic) with ulcer and inflammation of left lower extremity: Secondary | ICD-10-CM | POA: Diagnosis not present

## 2020-11-03 DIAGNOSIS — L97321 Non-pressure chronic ulcer of left ankle limited to breakdown of skin: Secondary | ICD-10-CM | POA: Insufficient documentation

## 2020-11-03 DIAGNOSIS — L97312 Non-pressure chronic ulcer of right 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: Secondary | ICD-10-CM | POA: Diagnosis not present

## 2020-11-03 DIAGNOSIS — L89323 Pressure ulcer of left buttock, stage 3: Secondary | ICD-10-CM | POA: Diagnosis not present

## 2020-11-06 NOTE — Progress Notes (Signed)
[name], [name] ([id number]) Visit Report for 11/03/2020 Arrival Information Details Patient Name: Date of Service: [place], [name] 11/03/2020 7:30 A M Medical Record Number: [id number] Patient Account Number: [id number] Date of Birth/Sex: Treating RN: 31-Mar-1986 (34 y.o. [name] Primary Care [name]: [place], [place] Other Clinician: Referring Samanthia Howland: Treating [name]/Extender: [name]: 23 Visit Information History Since Last Visit Added or deleted any medications: No Patient Arrived: Walker Any new allergies or adverse reactions: No Arrival Time: 07:51 Had a fall or experienced change in No Accompanied By: self activities of daily living that may affect Transfer Assistance: Manual risk of falls: Patient Identification Verified: Yes Signs or symptoms of abuse/neglect since last visito No Secondary Verification Process Completed: Yes Hospitalized since last visit: No Patient Requires Transmission-Based Precautions: No Implantable device outside of the clinic excluding No Patient Has Alerts: Yes cellular tissue based products placed in the center Patient Alerts: R ABI = 1.0 since last visit: L ABI = 1.1 Has Dressing in Place as Prescribed: Yes Pain Present Now: No Electronic Signature(s) Signed: 11/03/2020 9:33:18 AM By: Sandre Kitty Entered By: Sandre Kitty on 11/03/2020 07:51:28 -------------------------------------------------------------------------------- Clinic Level of Care Assessment Details Patient Name: Date of Service: [name], [name] 11/03/2020 7:30 A M Medical Record Number: [id number] Patient Account Number: [id number] Date of Birth/Sex: Treating RN: [birth year] (34 y.o. [name] Primary Care Gearldean Lomanto: [name], [name] Other Clinician: Referring [name]: Treating [name]/Extender: [name]: 75 Clinic Level of Care Assessment Items TOOL 4 Quantity  Score X- 1 0 Use when only an EandM is performed on FOLLOW-UP visit ASSESSMENTS - Nursing Assessment / Reassessment X- 1 10 Reassessment of Co-morbidities (includes updates in patient status) []  - 0 Reassessment of Adherence to Treatment Plan ASSESSMENTS - Wound and Skin A ssessment / Reassessment []  - 0 Simple Wound Assessment / Reassessment - one wound X- 3 5 Complex Wound Assessment / Reassessment - multiple wounds []  - 0 Dermatologic / Skin Assessment (not related to wound area) ASSESSMENTS - Focused Assessment []  - 0 Circumferential Edema Measurements - multi extremities []  - 0 Nutritional Assessment / Counseling / Intervention X- 1 5 Lower Extremity Assessment (monofilament, tuning fork, pulses) []  - 0 Peripheral Arterial Disease Assessment (using hand held doppler) ASSESSMENTS - Ostomy and/or Continence Assessment and Care []  - 0 Incontinence Assessment and Management []  - 0 Ostomy Care Assessment and Management (repouching, etc.) PROCESS - Coordination of Care X - Simple Patient / Family Education for ongoing care 1 15 []  - 0 Complex (extensive) Patient / Family Education for ongoing care X- 1 10 Staff obtains Programmer, systems, Records, T Results / Process Orders est []  - 0 Staff telephones HHA, Nursing Homes / Clarify orders / etc []  - 0 Routine Transfer to another Facility (non-emergent condition) []  - 0 Routine Hospital Admission (non-emergent condition) []  - 0 New Admissions / Biomedical engineer / Ordering NPWT Apligraf, etc. , []  - 0 Emergency Hospital Admission (emergent condition) X- 1 10 Simple Discharge Coordination []  - 0 Complex (extensive) Discharge Coordination PROCESS - Special Needs []  - 0 Pediatric / Minor Patient Management []  - 0 Isolation Patient Management []  - 0 Hearing / Language / Visual special needs []  - 0 Assessment of Community assistance (transportation, D/C planning, etc.) []  - 0 Additional assistance / Altered  mentation []  - 0 Support Surface(s) Assessment (bed, cushion, seat, etc.) INTERVENTIONS - Wound Cleansing / Measurement []  - 0 Simple Wound Cleansing - one wound X-  3 5 Complex Wound Cleansing - multiple wounds X- 1 5 Wound Imaging (photographs - any number of wounds) []  - 0 Wound Tracing (instead of photographs) []  - 0 Simple Wound Measurement - one wound X- 3 5 Complex Wound Measurement - multiple wounds INTERVENTIONS - Wound Dressings X - Small Wound Dressing one or multiple wounds 3 10 []  - 0 Medium Wound Dressing one or multiple wounds []  - 0 Large Wound Dressing one or multiple wounds []  - 0 Application of Medications - topical []  - 0 Application of Medications - injection INTERVENTIONS - Miscellaneous []  - 0 External ear exam []  - 0 Specimen Collection (cultures, biopsies, blood, body fluids, etc.) []  - 0 Specimen(s) / Culture(s) sent or taken to Lab for analysis []  - 0 Patient Transfer (multiple staff / Civil Service fast streamer / Similar devices) []  - 0 Simple Staple / Suture removal (25 or less) []  - 0 Complex Staple / Suture removal (26 or more) []  - 0 Hypo / Hyperglycemic Management (close monitor of Blood Glucose) []  - 0 Ankle / Brachial Index (ABI) - do not check if billed separately X- 1 5 Vital Signs Has the patient been seen at the hospital within the last three years: Yes Total Score: 135 Level Of Care: New/Established - Level 4 Electronic Signature(s) Signed: 11/04/2020 5:24:10 PM By: Levan Hurst RN, BSN Entered By: Levan Hurst on 11/03/2020 08:37:18 -------------------------------------------------------------------------------- Encounter Discharge Information Details Patient Name: Date of Service: [place]ain, [name] 11/03/2020 7:30 A M Medical Record Number: [id number] Patient Account Number: [id number] Date of Birth/Sex: Treating RN: 1986/02/15 (34 y.o. [name] Primary Care Aziyah Provencal: [place], [place] Other Clinician: Referring  [name]: Treating [name]/Extender: [name]: 252 Encounter Discharge Information Items Discharge Condition: Stable Ambulatory Status: Wheelchair Discharge Destination: Home Transportation: Private Auto Accompanied By: self Schedule Follow-up Appointment: Yes Clinical Summary of Care: Patient Declined Electronic Signature(s) Signed: 11/05/2020 9:59:17 AM By: Rhae Hammock RN Entered By: Rhae Hammock on 11/03/2020 08:23:05 -------------------------------------------------------------------------------- Lower Extremity Assessment Details Patient Name: Date of Service: [place]go, [name] 11/03/2020 7:30 A M Medical Record Number: [id number] Patient Account Number: [id number] Date of Birth/Sex: Treating RN: 1986-09-24 (34 y.o. [name] Primary Care Avabella Wailes: [place], [place] Other Clinician: Referring Tashawn Laswell: Treating Prayan Ulin/Extender: [name]: 252 Edema Assessment Assessed: [Left: No] [Right: Yes] Edema: [Left: Ye] [Right: s] Calf Left: Right: Point of Measurement: 33 cm From Medial Instep 33 cm Ankle Left: Right: Point of Measurement: 10 cm From Medial Instep 23 cm Vascular Assessment Pulses: Posterior Tibial Palpable: [Right:Yes] Electronic Signature(s) Signed: 11/05/2020 9:59:17 AM By: Rhae Hammock RN Entered By: Rhae Hammock on 11/03/2020 08:01:50 -------------------------------------------------------------------------------- Multi Wound Chart Details Patient Name: Date of Service: Mejorado, [name] 11/03/2020 7:30 A M Medical Record Number: [id number] Patient Account Number: [id number] Date of Birth/Sex: Treating RN: August 19, 1986 (34 y.o. [name] Primary Care Keavon Sensing: [name], [name] Other Clinician: Referring Karter Hellmer: Treating Adelai Achey/Extender: [name]: 252 Vital Signs Height(in): 70 Pulse(bpm):  111 Weight(lbs): 216 Blood Pressure(mmHg): 116/73 Body Mass Index(BMI): 31 Temperature(F): 98.4 Respiratory Rate(breaths/min): 18 Photos: [38R:No Photos Right T - Web between 1st and 2nd Left Ischium oe] [41R:No Photos] [46:No Photos Right, Dorsal Ankle] Wound Location: [38R:Gradually Appeared] [41R:Gradually Appeared] [46:Gradually Appeared] Wounding Event: [38R:Inflammatory] [41R:Pressure Ulcer] [46:Abrasion] Primary Etiology: [38R:Sleep Apnea, Hypertension, Paraplegia Sleep Apnea, Hypertension, Paraplegia N/A] Comorbid History: [38R:11/30/2019] [41R:03/16/2020] [46:08/13/2020] Date Acquired: [38R:48] [41R:33] [46:11] Weeks of Treatment: [38R:Open] [41R:Open] [46:Open] Wound Status: [  38R:Yes] [41R:Yes] [46:No] Wound Recurrence: [38R:No] [41R:Yes] [46:No] Clustered Wound: [38R:N/A] [41R:3] [46:N/A] Clustered Quantity: [38R:0.3x0.5x0.3] [41R:4.5x1.6x0.2] [46:0x0x0] Measurements L x W x D (cm) [38R:0.118] [41R:5.655] [46:0] A (cm) : rea [38R:0.035] [41R:1.131] [46:0] Volume (cm) : [38R:64.20%] [41R:-213.10%] [46:100.00%] % Reduction in A rea: [38R:84.80%] [41R:-524.90%] [46:100.00%] % Reduction in Volume: [38R:Full Thickness Without Exposed] [41R:Category/Stage II] [46:Full Thickness Without Exposed] Classification: [38R:Support Structures Small] [41R:Medium] [46:Support Structures N/A] Exudate Amount: [38R:Serous] [41R:Serosanguineous] [46:N/A] Exudate Type: [38R:amber] [41R:red, brown] [46:N/A] Exudate Color: [38R:Flat and Intact] [41R:Distinct, outline attached] [46:N/A] Wound Margin: [38R:Medium (34-66%)] [41R:Large (67-100%)] [46:N/A] Granulation Amount: [38R:Pink] [41R:Red, Friable] [46:N/A] Granulation Quality: [38R:Medium (34-66%)] [41R:Small (1-33%)] [46:N/A] Necrotic Amount: [38R:Fat Layer (Subcutaneous Tissue): Yes Fat Layer (Subcutaneous Tissue): Yes N/A] Exposed Structures: [38R:Fascia: No Tendon: No Muscle: No Joint: No Bone: No Large (67-100%)] [41R:Fascia: No Tendon:  No Muscle: No Joint: No Bone: No Small (1-33%)] [46:N/A] Wound Number: 47 N/A N/A Photos: No Photos N/A N/A Right, Dorsal Foot N/A N/A Wound Location: Gradually Appeared N/A N/A Wounding Event: Pressure Ulcer N/A N/A Primary Etiology: Sleep Apnea, Hypertension, Paraplegia N/A N/A Comorbid History: 11/02/2020 N/A N/A Date Acquired: 0 N/A N/A Weeks of Treatment: Open N/A N/A Wound Status: No N/A N/A Wound Recurrence: No N/A N/A Clustered Wound: N/A N/A N/A Clustered Quantity: 1.1x3.5x0.1 N/A N/A Measurements L x W x D (cm) 3.024 N/A N/A A (cm) : rea 0.302 N/A N/A Volume (cm) : N/A N/A N/A % Reduction in A rea: N/A N/A N/A % Reduction in Volume: Category/Stage II N/A N/A Classification: Medium N/A N/A Exudate A mount: Serosanguineous N/A N/A Exudate Type: red, brown N/A N/A Exudate Color: Distinct, outline attached N/A N/A Wound Margin: Medium (34-66%) N/A N/A Granulation A mount: Red, Pink N/A N/A Granulation Quality: Medium (34-66%) N/A N/A Necrotic A mount: Fascia: No N/A N/A Exposed Structures: Fat Layer (Subcutaneous Tissue): No Tendon: No Muscle: No Joint: No Bone: No Small (1-33%) N/A N/A Epithelialization: Treatment Notes Electronic Signature(s) Signed: 11/04/2020 5:24:10 PM By: Levan Hurst RN, BSN Signed: 11/06/2020 8:09:40 AM By: Linton Ham MD Entered By: Linton Ham on 11/03/2020 08:12:13 -------------------------------------------------------------------------------- Multi-Disciplinary Care Plan Details Patient Name: Date of Service: [place], [name] 11/03/2020 7:30 A M Medical Record Number: [id number] Patient Account Number: [id number] Date of Birth/Sex: Treating RN: February 10, 1986 (34 y.o. [name] Primary Care Ahnna Dungan: [name], [name] Other Clinician: Referring Naftula Donahue: Treating Sascha Palma/Extender: [name]: 252 Active Inactive Wound/Skin Impairment Nursing  Diagnoses: Impaired tissue integrity Knowledge deficit related to ulceration/compromised skin integrity Goals: Patient/caregiver will verbalize understanding of skin care regimen Date Initiated: 01/05/2016 Target Resolution Date: 11/28/2020 Goal Status: Active Ulcer/skin breakdown will have a volume reduction of 30% by week 4 Date Initiated: 01/05/2016 Date Inactivated: 12/22/2017 Target Resolution Date: 01/19/2018 Unmet Reason: complex wounds, Goal Status: Unmet infection Interventions: Assess patient/caregiver ability to obtain necessary supplies Assess ulceration(s) every visit Provide education on ulcer and skin care Notes: Electronic Signature(s) Signed: 11/04/2020 5:24:10 PM By: Levan Hurst RN, BSN Entered By: Levan Hurst on 11/03/2020 08:36:28 -------------------------------------------------------------------------------- Pain Assessment Details Patient Name: Date of Service: [place], [name] 11/03/2020 7:30 A M Medical Record Number: [id number] Patient Account Number: [id number] Date of Birth/Sex: Treating RN: 04/16/1986 (34 y.o. [name] Primary Care Chalene Treu: [place], [place] Other Clinician: Referring Shion Bluestein: Treating Dondi Aime/Extender: [name]: 252 Active Problems Location of Pain Severity and Description of Pain Patient Has Paino No Site Locations Pain Management and Medication Current Pain Management: Electronic Signature(s) Signed:  11/03/2020 9:33:18 AM By: Sandre Kitty Signed: 11/04/2020 5:24:10 PM By: Levan Hurst RN, BSN Entered By: Sandre Kitty on 11/03/2020 07:51:50 -------------------------------------------------------------------------------- Patient/Caregiver Education Details Patient Name: Date of Service: Nijjar, A Viviann Spare 12/13/2021andnbsp7:30 A M Medical Record Number: [id number] Patient Account Number: [id number] Date of Birth/Gender: Treating RN: Jan 05, 1986 (34 y.o. [name] Primary Care Physician: Janine Limbo Other Clinician: Referring Physician: Treating Physician/Extender: [name]: 35 Education Assessment Education Provided To: Patient Education Topics Provided Wound/Skin Impairment: Methods: Explain/Verbal Responses: State content correctly Electronic Signature(s) Signed: 11/04/2020 5:24:10 PM By: Levan Hurst RN, BSN Entered By: Levan Hurst on 11/03/2020 08:36:43 -------------------------------------------------------------------------------- Wound Assessment Details Patient Name: Date of Service: Vidrio, [name] 11/03/2020 7:30 A M Medical Record Number: [id number] Patient Account Number: [id number] Date of Birth/Sex: Treating RN: 05-29-1986 (35 y.o. [name] Primary Care Shakoya Gilmore: [name], [name] Other Clinician: Referring Danashia Landers: Treating Samanthan Dugo/Extender: [name]: 252 Wound Status Wound Number: [id number] Primary Etiology: Inflammatory Wound Location: Right T - Web between 1st and 2nd oe Wound Status: Open Wounding Event: Gradually Appeared Comorbid History: Sleep Apnea, Hypertension, Paraplegia Date Acquired: 11/30/2019 Weeks Of Treatment: 48 Clustered Wound: No Wound Measurements Length: (cm) 0.3 Width: (cm) 0.5 Depth: (cm) 0.3 Area: (cm) 0.118 Volume: (cm) 0.035 % Reduction in Area: 64.2% % Reduction in Volume: 84.8% Epithelialization: Large (67-100%) Tunneling: No Undermining: No Wound Description Classification: Full Thickness Without Exposed Support Structures Wound Margin: Flat and Intact Exudate Amount: Small Exudate Type: Serous Exudate Color: amber Foul Odor After Cleansing: No Slough/Fibrino Yes Wound Bed Granulation Amount: Medium (34-66%) Exposed Structure Granulation Quality: Pink Fascia Exposed: No Necrotic Amount: Medium (34-66%) Fat Layer (Subcutaneous Tissue) Exposed: Yes Necrotic Quality:  Adherent Slough Tendon Exposed: No Muscle Exposed: No Joint Exposed: No Bone Exposed: No Treatment Notes Wound #38R (Toe - Web between 1st and 2nd) Wound Laterality: Right Cleanser Soap and Water Discharge Instruction: May shower and wash wound with dial antibacterial soap and water prior to dressing change. Peri-Wound Care Sween Lotion (Moisturizing lotion) Discharge Instruction: Apply moisturizing lotion as directed Topical Primary Dressing KerraCel Ag Gelling Fiber Dressing, 2x2 in (silver alginate) Discharge Instruction: Apply silver alginate to wound bed as instructed Secondary Dressing Woven Gauze Sponge, Non-Sterile 4x4 in Discharge Instruction: Apply over primary dressing as directed. Secured With [employer], 4.5x3.1 (in/yd) Discharge Instruction: Secure with Kerlix as directed. Compression Wrap Compression Stockings Add-Ons Electronic Signature(s) Signed: 11/05/2020 9:59:17 AM By: Rhae Hammock RN Entered By: Rhae Hammock on 11/03/2020 07:58:19 -------------------------------------------------------------------------------- Wound Assessment Details Patient Name: Date of Service: [place], [name] 11/03/2020 7:30 A M Medical Record Number: [id number] Patient Account Number: [id number] Date of Birth/Sex: Treating RN: [birth year] (34 y.o. [name] Primary Care Rabia Argote: [place], [place] Other Clinician: Referring Kelissa Merlin: Treating Seline Enzor/Extender: [name]: 252 Wound Status Wound Number: 41R Primary Etiology: Pressure Ulcer Wound Location: Left Ischium Wound Status: Open Wounding Event: Gradually Appeared Comorbid History: Sleep Apnea, Hypertension, Paraplegia Date Acquired: 03/16/2020 Weeks Of Treatment: 33 Clustered Wound: Yes Photos Photo Uploaded By: Mikeal Hawthorne on 11/05/2020 14:22:03 Wound Measurements Length: (cm) 4.5 Width: (cm) 1.6 Depth: (cm) 0.2 Clustered Quantity: 3 Area:  (cm) 5.655 Volume: (cm) 1.131 % Reduction in Area: -213.1% % Reduction in Volume: -524.9% Epithelialization: Small (1-33%) Tunneling: No Undermining: No Wound Description Classification: Category/Stage II Wound Margin: Distinct, outline attached Exudate Amount: Medium Exudate Type: Serosanguineous Exudate Color: red, brown Foul Odor After Cleansing: No Slough/Fibrino  Yes Wound Bed Granulation Amount: Large (67-100%) Exposed Structure Granulation Quality: Red, Friable Fascia Exposed: No Necrotic Amount: Small (1-33%) Fat Layer (Subcutaneous Tissue) Exposed: Yes Necrotic Quality: Adherent Slough Tendon Exposed: No Muscle Exposed: No Joint Exposed: No Bone Exposed: No Treatment Notes Wound #41R (Ischium) Wound Laterality: Left Cleanser Soap and Water Discharge Instruction: May shower and wash wound with dial antibacterial soap and water prior to dressing change. Peri-Wound Care Topical Primary Dressing PolyMem Non-Adhesive Dressing, 4x4 in Discharge Instruction: Apply to wound bed as instructed Secondary Dressing ComfortFoam Border, 4x4 in (silicone border) Discharge Instruction: Apply over primary dressing as directed. Secured With Compression Wrap Compression Stockings Environmental education officer) Signed: 11/05/2020 9:59:17 AM By: Rhae Hammock RN Entered By: Rhae Hammock on 11/03/2020 08:01:24 -------------------------------------------------------------------------------- Wound Assessment Details Patient Name: Date of Service: [place], [name] 11/03/2020 7:30 A M Medical Record Number: [id number] Patient Account Number: [id number] Date of Birth/Sex: Treating RN: Apr 02, 1986 (34 y.o. [name] Primary Care Santino Kinsella: [name], [name] Other Clinician: Referring Cliffton Spradley: Treating Quest Tavenner/Extender: Dutch Gray, [name]: 252 Wound Status Wound Number: 46 Primary Etiology: Abrasion Wound Location: Right, Dorsal  Ankle Wound Status: Open Wounding Event: Gradually Appeared Date Acquired: 08/13/2020 Weeks Of Treatment: 11 Clustered Wound: No Photos Photo Uploaded By: Mikeal Hawthorne on 11/05/2020 14:21:30 Wound Measurements Length: (cm) Width: (cm) Depth: (cm) Area: (cm) Volume: (cm) 0 % Reduction in Area: 100% 0 % Reduction in Volume: 100% 0 0 0 Wound Description Classification: Full Thickness Without Exposed Support Structur es Electronic Signature(s) Signed: 11/05/2020 9:59:17 AM By: Rhae Hammock RN Entered By: Rhae Hammock on 11/03/2020 07:55:42 -------------------------------------------------------------------------------- Wound Assessment Details Patient Name: Date of Service: [place], [name] 11/03/2020 7:30 A M Medical Record Number: [id number] Patient Account Number: [id number] Date of Birth/Sex: Treating RN: [birth year] (34 y.o. [name] Primary Care Clora [name]: [name], [name] Other Clinician: Referring Oasis Goehring: Treating Ricke Kimoto/Extender: [name]: 252 Wound Status Wound Number: 47 Primary Etiology: Pressure Ulcer Wound Location: Right, Dorsal Foot Wound Status: Open Wounding Event: Gradually Appeared Comorbid History: Sleep Apnea, Hypertension, Paraplegia Date Acquired: 11/02/2020 Weeks Of Treatment: 0 Clustered Wound: No Photos Photo Uploaded By: Mikeal Hawthorne on 11/05/2020 14:22:04 Wound Measurements Length: (cm) 1.1 Width: (cm) 3.5 Depth: (cm) 0.1 Area: (cm) 3.024 Volume: (cm) 0.302 % Reduction in Area: % Reduction in Volume: Epithelialization: Small (1-33%) Tunneling: No Undermining: No Wound Description Classification: Category/Stage II Wound Margin: Distinct, outline attached Exudate Amount: Medium Exudate Type: Serosanguineous Exudate Color: red, brown Foul Odor After Cleansing: No Slough/Fibrino Yes Wound Bed Granulation Amount: Medium (34-66%) Exposed Structure Granulation  Quality: Red, Pink Fascia Exposed: No Necrotic Amount: Medium (34-66%) Fat Layer (Subcutaneous Tissue) Exposed: No Necrotic Quality: Adherent Slough Tendon Exposed: No Muscle Exposed: No Joint Exposed: No Bone Exposed: No Treatment Notes Wound #47 (Foot) Wound Laterality: Dorsal, Right Cleanser Peri-Wound Care Topical Primary Dressing KerraCel Ag Gelling Fiber Dressing, 2x2 in (silver alginate) Discharge Instruction: Apply silver alginate to wound bed as instructed Secondary Dressing ComfortFoam Border, 4x4 in (silicone border) Discharge Instruction: Apply over primary dressing as directed. Secured With Compression Wrap Compression Stockings Environmental education officer) Signed: 11/05/2020 9:59:17 AM By: Rhae Hammock RN Entered By: Rhae Hammock on 11/03/2020 08:00:42 -------------------------------------------------------------------------------- Vitals Details Patient Name: Date of Service: [place], [place] 11/03/2020 7:30 A M Medical Record Number: [id number] Patient Account Number: [id number] Date of Birth/Sex: Treating RN: 30-Dec-1985 (34 y.o. [name] Primary Care Zyonna Vardaman: [name], [name] Other Clinician: Referring Charliee Krenz: Treating Gerrett Loman/Extender:  Linton Ham [name], [name]: 252 Vital Signs Time Taken: 07:51 Temperature (F): 98.4 Height (in): 70 Pulse (bpm): 111 Weight (lbs): 216 Respiratory Rate (breaths/min): 18 Body Mass Index (BMI): 31 Blood Pressure (mmHg): 116/73 Reference Range: 80 - 120 mg / dl Electronic Signature(s) Signed: 11/03/2020 9:33:18 AM By: Sandre Kitty Entered By: Sandre Kitty on 11/03/2020 07:51:43

## 2020-11-06 NOTE — Progress Notes (Signed)
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 also reduced to 2 layer compression I'[name] not exactly sure of the issue here. 07/21/17; better surface today after 1 week of Iodoflex. Significant cellulitis that we treated last week also better. [Doxycycline] 07/28/17 better surface today with now 2 [name] of Iodoflex.  Significant cellulitis treated with doxycycline. He has now completed the doxycycline and he is back to his usual degree of chronic venous inflammation/stasis dermatitis. He reminds me he has had ablations surgery here 08/04/17; continued improvement with Iodoflex to the left lateral leg wound in terms of the surface of the wound although the dimensions are better. 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 Problematic areas on the plantar aspect of the first second toe web space on the left and the dorsal aspect of the first second toe web space on the right. At one point I felt these were probably related to chronic fungal infections in treated him aggressively for this although we have not made any improvement here. 08/11/17; left lateral leg. Surface continues to improve with the Iodoflex although we are not seeing much improvement in overall wound dimensions. Areas on his plantar left foot and right foot show no improvement. In fact the right foot looks somewhat worse 08/18/17; left lateral leg. We changed to [facility] Blue last week after [name] prolonged course of Iodoflex which helps get the surface better. It appears that the wound with is improved. Continue with difficult areas on the left dorsal first second and plantar first second on the right  09/01/17; patient arrives in clinic today having had [name] temperature of 103 yesterday. He was seen in the ER and [facility]. The patient was concerned he could have cellulitis again in the right leg however they diagnosed him with [name] UTI and he is now on Keflex. He has [name] history of cellulitis which is been recurrent and difficult but this is been in the left leg, in the past 5 use doxycycline. He does in and out catheterizations at home which are risk factors for UTI 09/08/17; patient will be completing his Keflex this weekend. The erythema on the left leg is considerably better. He has [name] new  wound today on the medial part of the right leg small superficial almost looks like [name] skin tear. He has worsening of the area on the right dorsal first and second toe. His major area on the left lateral leg is better. Using Hydrofera Blue on all areas 09/15/17; gradual reduction in width on the long wound in the left lateral leg. No debridement required. He also has wounds on the plantar aspect of his left first second toe web space and on the dorsal aspect of the right first second toe web space. 09/22/17; there continues to be very gradual improvements in the dimensions of the left lateral leg wound. He hasn't round erythematous spot with might be pressure on his wheelchair. There is no evidence obviously of infection no purulence no warmth He has [name] dry scaled area on the plantar aspect of the left first second toe Improved area on the dorsal right first second toe. 09/29/17; left lateral leg wound continues to improve in dimensions mostly with an is still [name] fairly long but increasingly narrow wound. He has [name] dry scaled area on the plantar aspect of his left first second toe web space Increasingly concerning area on the dorsal right first second toe. In fact I am concerned today about possible cellulitis around this wound. The areas extending up his second toe and although there is deformities here almost appears to abut on the nailbed. 10/06/17; left lateral leg wound continues to make very gradual progress. Tissue culture I did from the right first second toe dorsal foot last time grew MRSA and enterococcus which was vancomycin sensitive. This was not sensitive to clindamycin or doxycycline. He is allergic to Zyvox and sulfa we have therefore arrange for him to have dalvance infusion tomorrow. He is had this in the past and tolerated it well 10/20/17; left lateral leg wound continues to make decent progress. This is certainly reduced in terms of with there is advancing epithelialization.The  cellulitis in the right foot looks better although he still has [name] deep wound in the dorsal aspect of the first second toe web space. Plantar left first toe web space on the left I think is making some progress 10/27/17; left lateral leg wound continues to make decent progress. Advancing epithelialization.using Hydrofera Blue The right first second toe web space wound is better-looking using silver alginate Improvement in the left plantar first second toe web space. Again using silver alginate 11/03/17 left lateral leg wound continues to make decent progress albeit slowly. Using [facility] The right per second toe web space continues to be [name] very problematic looking punched out wound. I obtained [name] piece of tissue for deep culture I did extensively treated this for fungus. It is difficult to imagine that this is [name] pressure area as the patient states other than going outside he doesn't really wear shoes at home The  left plantar first second toe web space looked fairly senescent. Necrotic edges. This required debridement change to [facility] Blue to all wound areas 11/10/17; left lateral leg wound continues to contract. Using Hydrofera Blue On the right dorsal first second toe web space dorsally. Culture I did of this area last week grew MRSA there is not an easy oral option in this patient was multiple antibiotic allergies or intolerances. This was only [name] rare culture isolate I'[name] therefore going to use Bactroban under silver alginate On the left plantar first second toe web space. Debridement is required here. This is also unchanged 11/17/17; left lateral leg wound continues to contract using Hydrofera Blue this is no longer the major issue. The major concern here is the right first second toe web space. He now has an open area going from dorsally to the plantar aspect. There is now wound on the inner lateral part of the first toe. Not [name] very viable surface on this. There is erythema spreading  medially into the forefoot. No major change in the left first second toe plantar wound 11/24/17; left lateral leg wound continues to contract using Hydrofera Blue. Nice improvement today The right first second toe web space all of this looks [name] lot less angry than last week. I have given him clindamycin and topical Bactroban for MRSA and terbinafine for the possibility of underlining tinea pedis that I could not control with ketoconazole. Looks somewhat better The area on the plantar left first second toe web space is weeping with dried debris around the wound 12/01/17; left lateral leg wound continues to contract he Hydrofera Blue. It is becoming thinner in terms of with nevertheless it is making good improvement. The right first second toe web space looks less angry but still [name] large necrotic-looking wounds starting on the plantar aspect of the right foot extending between the toes and now extensively on the base of the right second toe. I gave him clindamycin and topical Bactroban for MRSA anterior benefiting for the possibility of underlying tinea pedis. Not looking better today The area on the left first/second toe looks better. Debrided of necrotic debris 12/05/17* the patient was worked in urgently today because over the weekend he found blood on his incontinence bad when he woke up. He was found to have an ulcer by his wife who does most of his wound care. He came in today for [place] to look at this. He has not had [name] history of wounds in his buttocks in spite of his paraplegia. 12/08/17; seen in follow-up today at his usual appointment. He was seen earlier this week and found to have [name] new wound on his buttock. We also follow him for wounds on the left lateral leg, left first second toe web space and right first second toe web space 12/15/17; we have been using Hydrofera Blue to the left lateral leg which has improved. The right first second toe web space has also improved. Left first second toe web  space plantar aspect looks stable. The left buttock has worsened using Santyl. Apparently the buttock has drainage 12/22/17; we have been using Hydrofera Blue to the left lateral leg which continues to improve now 2 small wounds separated by normal skin. He tells [place] he had [name] fever up to 100 yesterday he is prone to UTIs but has not noted anything different. He does in and out catheterizations. The area between the first and second toes today does not look good necrotic surface covered with what looks  to be purulent drainage and erythema extending into the third toe. I had gotten this to something that I thought look better last time however it is not look good today. He also has [name] necrotic surface over the buttock wound which is expanded. I thought there might be infection under here so I removed [name] lot of the surface with [name] #5 curet though nothing look like it really needed culturing. He is been using Santyl to this area 12/27/17; his original wound on the left lateral leg continues to improve using Hydrofera Blue. I gave him samples of Baxdella although he was unable to take them out of fear for an allergic reaction ["lump in his throat"].the culture I did of the purulent drainage from his second toe last week showed both enterococcus and [name] set Enterobacter I was also concerned about the erythema on the bottom of his foot although paradoxically although this looks somewhat better today. 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 We've been using silver alginate here. They came up today that he sleeps in [name] recliner, I'[name] not sure why but I asked him to stop this 01/03/18; his original wound we've been using Hydrofera Blue is now separated into 2 areas. Ulcer on his left buttock is better he is off the recliner and sleeping in bed Finally both wound areas between his first and second toes also looks some  better 01/10/18; his original wound on the left lateral leg is now separated into 2 wounds we've been using Hydrofera Blue Ulcer on his left buttock has some drainage. There is [name] small probing site going into muscle layer superiorly.using silver alginate -He arrives today with [name] deep tissue injury on the left heel The wound on the dorsal aspect of his first second toe on the left looks [name] lot betterusing silver alginate ketoconazole The area on the first second toe web space on the right also looks [name] lot bette 01/17/18; his original wound on the left lateral leg continues to progress using Hydrofera Blue Ulcer on his left buttock also is smaller surface healthier except for [name] small probing site going into the muscle layer superiorly. 2.4 cm of tunneling in this area DTI on his left heel we have only been offloading. Looks better than last week no threatened open no evidence of infection the wound on the dorsal aspect of the first second toe on the left continues to look like it's regressing we have only been using silver alginate and terbinafine orally The area in the first second toe web space on the right also looks to be [name] lot better using silver alginate and terbinafine I think this was prompted by tinea pedis 01/31/18; the patient was hospitalized in [place] last week apparently for [name] complicated UTI. He was discharged on cefepime he does in and out catheterizations. In the hospital he was discovered [name] I don't mild elevation of AST and ALT and the terbinafine was stopped.predictably the pressure ulcer on s his buttock looks betterusing silver alginate. The area on the left lateral leg also is better using Hydrofera Blue. The area between the first and second toes on the left better. First and second toes on the right still substantial but better. Finally the DTI on the left heel has held together and looks like it's resolving 02/07/18-he is here in follow-up evaluation for multiple ulcerations.  He has new injury to the lateral aspect of  the last issue [name] pressure ulcer, he states this is from adhesive removal trauma. He states he has tried multiple adhesive products with no success. All other ulcers appear stable. The left heel DTI is resolving. We will continue with same treatment plan and follow-up next week. 02/14/18; follow-up for multiple areas. He has [name] new area last week on the lateral aspect of his pressure ulcer more over the posterior trochanter. The original pressure ulcer looks quite stable has healthy granulation. 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 Almost fully epithelialized on the original superior area using Hydrofera Blue DTI on the left heel has peeled off this week to reveal [name] small superficial wound under denuded skin and subcutaneous tissue Both areas between the first and second toes look better including nothing open on the left 02/21/18; The patient's wounds on his left ischial tuberosity and posterior left greater trochanter actually looked better. He has [name] large area of irritation around the area which I think is contact dermatitis. I am doubtful that this is fungal His original wound on the left lateral calf continues to improve we have been using Hydrofera Blue There is no open area in the left first second toe web space although there is [name] lot of thick callus The DTI on the left heel required debridement today of necrotic surface eschar and subcutaneous tissue using silver alginate Finally the area on the right first second toe webspace continues to contract using silver alginate and ketoconazole 02/28/18 Left ischial tuberosity wounds look better using silver alginate. Original wound on the left calf only has one small open area left using Hydrofera Blue DTI on the left heel required debridement mostly removing skin from around this wound surface. Using  silver alginate The areas on the right first/second toe web space using silver alginate and ketoconazole 03/08/18 on evaluation today patient appears to be doing decently well as best I can tell in regard to his wounds. This is the first time that I have seen him as he generally is followed by Dr. Dellia Nims. With that being said none of his wounds appear to be infected he does have an area where there is some skin covering what appears to be [name] new wound on the left dorsal surface of his great toe. This is right at the nail bed. With that being said I do believe that debrided away some of the excess skin can be of benefit in this regard. Otherwise he has been tolerating the dressing changes without complication. 03/14/18; patient arrives today with the multiplicity of wounds that we are following. He has not been systemically unwell Original wound on the left lateral calf now only has 2 small open areas we've been using Hydrofera Blue which should continue The deep tissue injury on the left heel requires debridement today. We've been using silver alginate The left first second toe and the right first second toe are both are reminiscence what I think was tinea pedis. Apparently some of the callus Surface between the toes was removed last week when it started draining. Purulent drainage coming from the wound on the ischial tuberosity on the left. 03/21/18-He is here in follow-up evaluation for multiple wounds. 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 He tolerated debridement. The only change to last week's recommendations is to discontinue antifungal cream between toes. He will follow-up next week  03/28/18; following up for multiple wounds;Concern this week is streaking redness and swelling in the right foot. He is going to need antibiotics for this. 03/31/18; follow-up for right foot cellulitis. Streaking redness and swelling in the  right foot on 03/28/18. He has multiple antibiotic intolerances and [name] history of MRSA. I put him on clindamycin 300 mg every 6 and brought him in for [name] quick check. He has an open wound between his first and second toes on the right foot as [name] potential source. 04/04/18; Right foot cellulitis is resolving he is completing clindamycin. This is truly good news Left lateral calf wound which is initial wound only has one small open area inferiorly this is close to healing out. He has compression stockings. We will use Hydrofera Blue right down to the epithelialization of this Nonviable surface on the left heel which was initially pressure with [name] DTI. We've been using Hydrofera Blue. I'[name] going to switch this back to silver alginate Left first second toe/tinea pedis this looks better using silver alginate Right first second toe tinea pedis using silver alginate Large pressure ulcers on theLeft ischial tuberosity. Small wound here Looks better. I am uncertain about the surface over the large wound. Using silver alginate 04/11/18; Cellulitis in the right foot is resolved Left lateral calf wound which was his original wounds still has 2 tiny open areas remaining this is just about closed Nonviable surface on the left heel is better but still requires debridement Left first second toe/tinea pedis still open using silver alginate Right first second toe wound tinea pedis I asked him to go back to using ketoconazole and silver alginate Large pressure ulcers on the left ischial tuberosity this shear injury here is resolved. Wound is smaller. No evidence of infection using silver alginate 04/18/18; Patient arrives with an intense area of cellulitis in the right mid lower calf extending into the right heel area. Bright red and warm. Smaller area on the left anterior leg. He has [name] significant history of MRSA. He will definitely need antibioticsdoxycycline He now has 2 open areas on the left ischial tuberosity the  original large wound and now [name] satellite area which I think was above his initial satellite areas. Not [name] wonderful surface on this satellite area surrounding erythema which looks like pressure related. His left lateral calf wound again his original wound is just about closed Left heel pressure injury still requiring debridement Left first second toe looks [name] lot better using silver alginate Right first second toe also using silver alginate and ketoconazole cream also looks better 04/20/18; the patient was worked in early today out of concerns with his cellulitis on the right leg. I had started him on doxycycline. This was 2 days ago. His wife was concerned about the swelling in the area. Also concerned about the left buttock. He has not been systemically unwell no fever chills. No nausea vomiting or diarrhea 04/25/18; the patient's left buttock wound is continued to deteriorate he is using Hydrofera Blue. He is still completing clindamycin for the cellulitis on the right leg although all of this looks better. 05/02/18 Left buttock wound still with [name] lot of drainage and [name] very tightly adherent fibrinous necrotic surface. He has [name] deeper area superiorly The left lateral calf wound is still closed DTI wound on the left heel necrotic surface especially the circumference using Iodoflex Areas between his left first second toe and right first second toe both look better. Dorsally and the right first second toe he  had [name] necrotic surface although at smaller. In using silver alginate and ketoconazole. I did [name] culture last week which was [name] deep tissue culture of the reminiscence of the open wound on the right first second toe dorsally. This grew [name] few Acinetobacter and [name] few methicillin-resistant staph aureus. Nevertheless the area actually this week looked better. I didn't feel the need to specifically address this at least in terms of systemic antibiotics. 05/09/18; wounds are measuring larger more drainage per  our intake. We are using Santyl covered with alginate on the large superficial buttock wounds, Iodosorb on the left heel, ketoconazole and silver alginate to the dorsal first and second toes bilaterally. 05/16/18; The area on his left buttock better in some aspects although the area superiorly over the ischial tuberosity required an extensive debridement.using Santyl Left heel appears stable. Using Iodoflex The areas between his first and second toes are not bad however there is spreading erythema up the dorsal aspect of his left foot this looks like cellulitis again. He is insensate the erythema is really very brilliant.o Erysipelas He went to see an allergist days ago because he was itching part of this he had lab work done. This showed [name] white count of 15.1 with 70% neutrophils. Hemoglobin of 11.4 and [name] platelet count of 659,000. Last white count we had in Epic was [name] 2-1/2 years ago which was 25.9 but he was ill at the time. He was able to show me some lab work that was done by his primary physician the pattern is about the same. 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 But prompted me to go ahead and do x-rays of both feet and the pelvis rule out osteomyelitis. He also had [name] comprehensive metabolic panel this was reasonably normal his albumin was 3.7 liver function tests BUN/creatinine all normal 05/23/18; x-rays of both his feet from last week were negative for underlying pulmonary abnormality. The x-ray of his pelvis however showed mild irregularity in the left ischial which may represent some early osteomyelitis. The wound in the left ischial continues to get deeper clearly now exposed muscle. Each week necrotic surface material over this area. Whereas the rest of the wounds do not look so bad. The left ischial wound we have been using Santyl and calcium alginate T the left heel surface necrotic debris using Iodoflex o The left lateral leg is still  healed Areas on the left dorsal foot and the right dorsal foot are about the same. There is some inflammation on the left which might represent contact dermatitis, fungal dermatitis I am doubtful cellulitis although this looks better than last week 05/30/18; CT scan done at Hospital did not show any osteomyelitis or abscess. Suggested the possibility of underlying cellulitis although I don't see [name] lot of evidence of this at the bedside The wound itself on the left buttock/upper thigh actually looks somewhat better. No debridement Left heel also looks better no debridement continue Iodoflex Both dorsal first second toe spaces appear better using Lotrisone. Left still required debridement 06/06/18; Intake reported some purulent looking drainage from the left gluteal wound. Using Santyl and calcium alginate Left heel looks better although still [name] nonviable surface requiring debridement The left dorsal foot first/second webspace actually expanding and somewhat deeper. I may consider doing [name] shave biopsy of this area Right dorsal foot first/second webspace appears stable to improved. Using Lotrisone and silver alginate to both these areas 06/13/18 Left gluteal surface looks  better. Now separated in the 2 wounds. No debridement required. Still drainage. We'll continue silver alginate Left heel continues to look better with Iodoflex continue this for at least another week Of his dorsal foot wounds the area on the left still has some depth although it looks better than last week. We've been using Lotrisone and silver alginate 06/20/18 Left gluteal continues to look better healthy tissue Left heel continues to look better healthy granulation wound is smaller. He is using Iodoflex and his long as this continues continue the Iodoflex Dorsal right foot looks better unfortunately dorsal left foot does not. There is swelling and erythema of his forefoot. He had minor trauma to this several days ago but doesn't  think this was enough to have caused any tissue injury. Foot looks like cellulitis, we have had this problem before 06/27/18 on evaluation today patient appears to be doing [name] little worse in regard to his foot ulcer. 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 Both of which would really be his only options for treating this infection. In the past he has been given and effusion of Orbactiv. This is done very well for him in the past again it's one time dosing IV antibiotic therapy. Subsequently I do believe this is something we're gonna need to see about doing at this point in time. Currently his other wounds seem to be doing somewhat better in my pinion I'[name] pretty happy in that regard. 07/03/18 on evaluation today patient's wounds actually appear to be doing fairly well. He has been tolerating the dressing changes without complication. All in all he seems to be showing signs of improvement. In regard to the antibiotics he has been dealing with infectious disease since I saw him last week as far as getting this scheduled. In the end [facility]'s [facility] this coming Friday. In the meantime he has been continuing to perform the dressing changes in such as previous. There does not appear to be any evidence of infection worsengin at this time. 07/10/18; Since I last saw this man 2 [name] ago things have actually improved. IV antibiotics of resulted in less forefoot erythema although there is still some present. He is not systemically unwell Left buttock wounds 2 now have no depth there is increased epithelialization Using silver alginate Left heel still requires debridement using Iodoflex Left dorsal foot still with [name] sizable wound about the size of [name] border but healthy granulation Right dorsal foot still with [name]  slitlike area using silver alginate 07/18/18; the patient's cellulitis in the left foot is improved in fact I think it is on its way to resolving. Left buttock wounds 2 both look better although the larger one has hypertension granulation we've been using silver alginate Left heel has some thick circumferential redundant skin over the wound edge which will need to be removed today we've been using Iodoflex Left dorsal foot is still [name] sizable wound required debridement using silver alginate The right dorsal foot is just about closed only [name] small open area remains here 07/25/18; left foot cellulitis is resolved Left buttock wounds 2 both look better. Hyper-granulation on the major area Left heel as some debris over the surface but otherwise looks [name] healthier wound. Using silver collagen Right dorsal foot is just about closed 07/31/18; arrives with  our intake nurse worried about purulent drainage from the buttock. We had hyper-granulation here last week His buttock wounds 2 continue to look better Left heel some debris over the surface but measuring smaller. Right dorsal foot unfortunately has openings between the toes Left foot superficial wound looks less aggravated. 08/07/18 Buttock wounds continue to look better although some of her granulation and the larger medial wound. silver alginate Left heel continues to look [name] lot better.silver collagen Left foot superficial wound looks less stable. Requires debridement. He has [name] new wound superficial area on the foot on the lateral dorsal foot. Right foot looks better using silver alginate without Lotrisone 08/14/2018; patient was in the ER last week diagnosed with [name] UTI. He is now on Cefpodoxime and Macrodantin. Buttock wounds continued to be smaller. Using silver alginate Left heel continues to look better using silver collagen Left foot superficial wound looks as though it is improving Right dorsal foot area is just about healed. 08/21/2018; patient is  completed his antibiotics for his UTI. He has 2 open areas on the buttocks. There is still not closed although the surface looks satisfactory. Using silver alginate Left heel continues to improve using silver collagen The bilateral dorsal foot areas which are at the base of his first and second toes/possible tinea pedis are actually stable on the left but worse on the right. The area on the left required debridement of necrotic surface. After debridement I obtained [name] specimen for PCR culture. The right dorsal foot which is been just about healed last week is now reopened 08/28/2018; culture done on the left dorsal foot showed coag negative staph both staph epidermidis and Lugdunensis. I think this is worthwhile initiating systemic treatment. I will use doxycycline given his long list of allergies. The area on the left heel slightly improved but still requiring debridement. The large wound on the buttock is just about closed whereas the smaller one is larger. Using silver alginate in this area 09/04/2018; patient is completing his doxycycline for the left foot although this continues to be [name] very difficult wound area with very adherent necrotic debris. We are using silver alginate to all his wounds right foot left foot and the small wounds on his buttock, silver collagen on the left heel. 09/11/2018; once again this patient has intense erythema and swelling of the left forefoot. Lesser degrees of erythema in the right foot. He has [name] long list of allergies and intolerances. I will reinstitute doxycycline. 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 Using silver alginate Left heel also looks better using silver collagen Unfortunately both the areas on his feet look worse. The area on the left first second webspace is now gone through to the plantar part of his foot. The area on the left foot anteriorly is irritated with erythema and swelling in the  forefoot. 09/25/2018 His wound on the left plantar heel looks better. Using silver collagen The area on the left buttock 2 small remnant areas. One is closed one is still open. Using silver alginate The areas between both his first and second toes look worse. This in spite of long-standing antifungal therapy with ketoconazole and silver alginate which should have antifungal activity He has small areas around his original wound on the left calf one is on the bottom of the original scar tissue and one superiorly both of these are small and superficial but again given wound history in this site this is  worrisome 10/02/2018 Left plantar heel continues to gradually contract using silver collagen Left buttock wound is unchanged using silver alginate The areas on his dorsal feet between his first and second toes bilaterally look about the same. I prescribed clindamycin ointment to see if we can address chronic staph colonization and also the underlying possibility of erythrasma The left lateral lower extremity wound is actually on the lateral part of his ankle. Small open area here. We have been using silver alginate 10/09/2018; Left plantar heel continues to look healthy and contract. No debridement is required Left buttock slightly smaller with [name] tape injury wound just below which was new this week Dorsal feet somewhat improved I have been using clindamycin Left lateral looks lower extremity the actual open area looks worse although [name] lot of this is epithelialized. I am going to change to silver collagen today He has [name] lot more swelling in the right leg although this is not pitting not red and not particularly warm there is [name] lot of spasm in the right leg usually indicative of people with paralysis of some underlying discomfort. We have reviewed his vascular status from 2017 he had [name] left greater saphenous vein ablation. I wonder about referring him back to vascular surgery if the area on the left  leg continues to deteriorate. 10/16/2018 in today for follow-up and management of multiple lower extremity ulcers. His left Buttock wound is much lower smaller and almost closed completely. The wound to the left ankle has began to reopen with Epithelialization and some adherent slough. He has multiple new areas to the left foot and leg. The left dorsal foot without much improvement. Wound present between left great webspace and 2nd toe. Erythema and edema present right leg. Right LE ultrasound obtained on 10/10/18 was negative for DVT . 10/23/2018; Left buttock is closed over. Still dry macerated skin but there is no open wound. I suspect this is chronic pressure/moisture Left lateral calf is quite [name] bit worse than when I saw this last. There is clearly drainage here he has macerated skin into the left plantar heel. We will change the primary dressing to alginate Left dorsal foot has some improvement in overall wound area. Still using clindamycin and silver alginate Right dorsal foot about the same as the left using clindamycin and silver alginate The erythema in the right leg has resolved. He is DVT rule out was negative Left heel pressure area required debridement although the wound is smaller and the surface is health 10/26/2018 The patient came back in for his nurse check today predominantly because of the drainage coming out of the left lateral leg with [name] recent reopening of his original wound on the left lateral calf. He comes in today with [name] large amount of surrounding erythema around the wound extending from the calf into the ankle and even in the area on the dorsal foot. He is not systemically unwell. He is not febrile. Nevertheless this looks like cellulitis. We have been using silver alginate to the area. I changed him to [name] regular visit and I am going to prescribe him doxycycline. The rationale here is [name] long list of medication intolerances and [name] history of MRSA. I did not see anything  that I thought would provide [name] valuable culture 10/30/2018 Follow-up from his appointment 4 days ago with really an extensive area of cellulitis in the left calf left lateral ankle and left dorsal foot. I put him on doxycycline. He has [name] long list of medication allergies  which are true allergy reactions. 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 In any case he is [name] lot better today. The erythema especially in the anterior and lateral left calf is better. He still has left ankle erythema. He also is complaining about increasing edema in the right leg we have only been using Kerlix Coban and he has been doing the wraps at home. Finally he has [name] spotty rash on the medial part of his upper left calf which looks like folliculitis or perhaps wrap occlusion type injury. Small superficial macules not pustules 11/06/18 patient arrives today with again [name] considerable degree of erythema around the wound on the left lateral calf extending into the dorsal ankle and dorsal foot. This is [name] lot worse than when I saw this last week. He is on doxycycline really with not [name] lot of improvement. He has not been systemically unwell Wounds on the; left heel actually looks improved. Original area on the left foot and proximity to the first and second toes looks about the same. He has superficial areas on the dorsal foot, anterior calf and then the reopening of his original wound on the left lateral calf which looks about the same The only area he has on the right is the dorsal webspace first and second which is smaller. He has [name] large area of dry erythematous skin on the left buttock small open area here. 11/13/2018; the patient arrives in much better condition. The erythema around the wound on the left lateral calf is [name] lot better. 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 In any case this is [name] lot better. The area on the left heel is very small and just about resolved using silver collagen we have been using silver alginate to the areas on his dorsal feet 11/20/2018; his wounds include the left lateral calf, left heel, dorsal aspects of both feet just proximal to the first second webspace. He is stable to slightly improved. I did not think any changes to his dressings were going to be necessary 11/27/2018 he has [name] reopening on the left buttock which is surrounded by what looks like tinea or perhaps some other form of dermatitis. The area on the left dorsal foot has some erythema around it I have marked this area but I am not sure whether this is cellulitis or not. Left heel is not closed. Left calf the reopening is really slightly longer and probably worse 1/13; in general things look better and smaller except for the left dorsal foot. Area on the left heel is just about closed, left buttock looks better only [name] small wound remains in the skin looks better [using Lotrisone] 1/20; the area on the left heel only has [name] few remaining open areas here. Left lateral calf about the same in terms of size, left dorsal foot slightly larger right lateral foot still not closed. The area on the left buttock has no open wound and the surrounding skin looks [name] lot better 1/27; the area on the left heel is closed. Left lateral calf better but still requiring extensive debridements. The area on his left buttock is closed. He still has the open areas on the left dorsal foot which is slightly smaller in the right foot which is slightly expanded. We have been using Iodoflex on these areas as well 2/3; left heel is  closed. Left lateral calf still requiring debridement using Iodoflex there is no open area on his left buttock however he has dry scaly skin over [name] large area of this. Not really responding well to the Lotrisone. Finally the areas on his dorsal feet at the level of the  first second webspace are slightly smaller on the right and about the same on the left. Both of these vigorously debrided with Anasept and gauze 2/10; left heel remains closed he has dry erythematous skin over the left buttock but there is no open wound here. Left lateral leg has come in and with. Still requiring debridement we have been using Iodoflex here. Finally the area on the left dorsal foot and right dorsal foot are really about the same extremely dry callused fissured areas. He does not yet have [name] dermatology appointment 2/17; left heel remains closed. He has [name] new open area on the left buttock. The area on the left lateral calf is bigger longer and still covered in necrotic debris. No major change in his foot areas bilaterally. I am awaiting for [name] dermatologist to look on this. We have been using ketoconazole I do not know that this is been doing any good at all. 2/24; left heel remains closed. The left buttock wound that was new reopening last week looks better. The left lateral calf appears better also although still requires debridement. The major area on his foot is the left first second also requiring debridement. We have been putting Prisma on all wounds. I do not believe that the ketoconazole has done too much good for his feet. He will use Lotrisone I am going to give him [name] 2-week course of terbinafine. We still do not have [name] dermatology appointment 3/2 left heel remains closed however there is skin over bone in this area I pointed this out to him today. The left buttock wound is epithelialized but still does not look completely stable. The area on the left leg required debridement were using silver collagen here. With regards to his feet we changed to Lotrisone last week and silver alginate. 3/9; left heel remains closed. Left buttock remains closed. The area on the right foot is essentially closed. The left foot remains unchanged. Slightly smaller on the left lateral calf. Using  silver collagen to both of these areas 3/16-Left heel remains closed. Area on right foot is closed. Left lateral calf above the lateral malleolus open wound requiring debridement with easy bleeding. Left dorsal wound proximal to first toe also debrided. Left ischial area open new. Patient has been using Prisma with wrapping every 3 days. Dermatology appointment is apparently tomorrow.Patient has completed his terbinafine 2-week course with some apparent improvement according to him, there is still flaking and dry skin in his foot on the left 3/23; area on the right foot is reopened. The area on the left anterior foot is about the same still [name] very necrotic adherent surface. He still has the area on the left leg and reopening is on the left buttock. He apparently saw dermatology although I do not have [name] note. According to the patient who is usually fairly well informed they did not have any good ideas. Put him on oral terbinafine which she is been on before. 3/30; using silver collagen to all wounds. Apparently his dermatologist put him on doxycycline and rifampin presumably some culture grew staph. I do not have this result. He remains on terbinafine although I have used terbinafine on him before 4/6; patient has had  [name] fairly substantial reopening on the right foot between the first and second toes. He is finished his terbinafine and I believe is on doxycycline and rifampin still as prescribed by dermatology. We have been using silver collagen to all his wounds although the patient reports that he thinks silver alginate does better on the wounds on his buttock. 4/13; the area on his left lateral calf about the same size but it did not require debridement. Left dorsal foot just proximal to the webspace between the first and second toes is about the same. Still nonviable surface. I note some superficial bronze discoloration of the dorsal part of his foot Right dorsal foot just proximal to the first  and second toes also looks about the same. I still think there may be the same discoloration I noted above on the left Left buttock wound looks about the same 4/20; left lateral calf appears to be gradually contracting using silver collagen. 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 The left dorsal foot wound is debrided of tightly adherent necrotic debris and really cleans up quite nicely. The right area is worse with expansion. I did not debride this it is now over the base of the second toe The area on his left buttock is smaller no debridement is required using silver collagen 5/4; left calf continues to make good progress. He arrives with erythema around the wounds on his dorsal foot which even extends to the plantar aspect. Very concerning for coexistent infection. He is finished the erythromycin I gave him for possible erythrasma this does not seem to have helped. The area on the left foot is about the same base of the dorsal toes Is area on the buttock looks improved on the left 5/11; left calf and left buttock continued to make good progress. Left foot is about the same to slightly improved. Major problem is on the right foot. He has not had an x-ray. Deep tissue culture I did last week showed both Enterobacter and [name]. coli. I did not change the doxycycline I put him on empirically although neither 1 of these were plated to doxycycline. He arrives today with the erythema looking worse on both the dorsal and plantar foot. Macerated skin on the bottom of the foot. he has not been systemically unwell 5/18-Patient returns at 1 week, left calf wound appears to be making some progress, left buttock wound appears slightly worse than last time, left foot wound looks slightly better, right foot redness is marginally better. X-ray of both feet show no air or evidence of osteomyelitis. Patient is finished his Omnicef and terbinafine. He  continues to have macerated skin on the bottom of the left foot as well as right 5/26; left calf wound is better, left buttock wound appears to have multiple small superficial open areas with surrounding macerated skin. X-rays that I did last time showed no evidence of osteomyelitis in either foot. He is finished cefdinir and doxycycline. I do not think that he was on terbinafine. He continues to have [name] large superficial open area on the right foot anterior dorsal and slightly between the first and second toes. I did send him to dermatology 2 months ago or so wondering about whether they would do [name] fungal scraping. I do not believe they did but did do [name] culture. We have been using silver alginate to the toe areas, he has been using antifungals at home topically either ketoconazole or Lotrisone. We are using silver  collagen on the left foot, silver alginate on the right, silver collagen on the left lateral leg and silver alginate on the left buttock 6/1; left buttock area is healed. We have the left dorsal foot, left lateral leg and right dorsal foot. We are using silver alginate to the areas on both feet and silver collagen to the area on his left lateral calf 6/8; the left buttock apparently reopened late last week. He is not really sure how this happened. He is tolerating the terbinafine. Using silver alginate to all wounds 6/15; left buttock wound is larger than last week but still superficial. Came in the clinic today with [name] report of purulence from the left lateral leg I did not identify any infection Both areas on his dorsal feet appear to be better. He is tolerating the terbinafine. Using silver alginate to all wounds 6/22; left buttock is about the same this week, left calf quite [name] bit better. His left foot is about the same however he comes in with erythema and warmth in the right forefoot once again. Culture that I gave him in the beginning of May showed Enterobacter and [name]. coli. I gave him  doxycycline and things seem to improve although neither 1 of these organisms was specifically plated. 6/29; left buttock is larger and dry this week. Left lateral calf looks to me to be improved. Left dorsal foot also somewhat improved right foot completely unchanged. The erythema on the right foot is still present. He is completing the Ceftin dinner that I gave him empirically [see discussion above.) 7/6 - All wounds look to be stable and perhaps improved, the left buttock wound is slightly smaller, per patient bleeds easily, completed ceftin, the right foot redness is less, he is on terbinafine 7/13; left buttock wound about the same perhaps slightly narrower. Area on the left lateral leg continues to narrow. Left dorsal foot slightly smaller right foot about the same. We are using silver alginate on the right foot and Hydrofera Blue to the areas on the left. Unna boot on the left 2 layer compression on the right 7/20; left buttock wound absolutely the same. Area on lateral leg continues to get better. Left dorsal foot require debridement as did the right no major change in the 7/27; left buttock wound the same size necrotic debris over the surface. The area on the lateral leg is closed once again. His left foot looks better right foot about the same although there is some involvement now of the posterior first second toe area. He is still on terbinafine which I have given him for [name] month, not certain [name] centimeter major change 06/25/19-All wounds appear to be slightly improved according to report, left buttock wound looks clean, both foot wounds have minimal to no debris the right dorsal foot has minimal slough. We are using Hydrofera Blue to the left and silver alginate to the right foot and ischial wound. 8/10-Wounds all appear to be around the same, the right forefoot distal part has some redness which was not there before, however the wound looks clean and small. Ischial wound looks about the  same with no changes 8/17; his wound on the left lateral calf which was his original chronic venous insufficiency wound remains closed. Since I last saw him the areas on the left dorsal foot right dorsal foot generally appear better but require debridement. The area on his left initial tuberosity appears somewhat larger to me perhaps hyper granulated and bleeds very easily. We have been  using Hydrofera Blue to the left dorsal foot and silver alginate to everything else 8/24; left lateral calf remains closed. The areas on his dorsal feet on the webspace of the first and second toes bilaterally both look better. The area on the left buttock which is the pressure ulcer stage II slightly smaller. I change the dressing to Hydrofera Blue to all areas 8/31; left lateral calf remains closed. The area on his dorsal feet bilaterally look better. Using Hydrofera Blue. Still requiring debridement on the left foot. No change in the left buttock pressure ulcers however 9/14; left lateral calf remains closed. Dorsal feet look quite [name] bit better than 2 [name] ago. Flaking dry skin also [name] lot better with the ammonium lactate I gave him 2 [name] ago. The area on the left buttock is improved. He states that his Roho cushion developed [name] leak and he is getting [name] new one, in the interim he is offloading this vigorously 9/21; left calf remains closed. Left heel which was [name] possible DTI looks better this week. He had macerated tissue around the left dorsal foot right foot looks satisfactory and improved left buttock wound. I changed his dressings to his feet to silver alginate bilaterally. Continuing Hydrofera Blue on the left buttock. 9/28 left calf remains closed. Left heel did not develop anything [possible DTI] dry flaking skin on the left dorsal foot. Right foot looks satisfactory. Improved left buttock wound. We are using silver alginate on his feet Hydrofera Blue on the buttock. I have asked him to go back to the  Lotrisone on his feet including the wounds and surrounding areas 10/5; left calf remains closed. The areas on the left and right feet about the same. [name] lot of this is epithelialized however debris over the remaining open areas. He is using Lotrisone and silver alginate. The area on the left buttock using Hydrofera Blue 10/26. Patient has been out for 3 [name] secondary to Covid concerns. He tested negative but I think his wife tested positive. He comes in today with the left foot substantially worse, right foot about the same. Even more concerning he states that the area on his left buttock closed over but then reopened and is considerably deeper in one aspect than it was before [stage III wound] 11/2; left foot really about the same as last week. Quarter sized wound on the dorsal foot just proximal to the first second toes. Surrounding erythema with areas of denuded epithelium. This is not really much different looking. Did not look like cellulitis this time however. Right foot area about the same.. We have been using silver alginate alginate on his toes Left buttock still substantial irritated skin around the wound which I think looks somewhat better. We have been using Hydrofera Blue here. 11/9; left foot larger than last week and [name] very necrotic surface. Right foot I think is about the same perhaps slightly smaller. Debris around the circumference also addressed. Unfortunately on the left buttock there is been [name] decline. Satellite lesions below the major wound distally and now [name] an additional one posteriorly we have been using Hydrofera Blue but I think this is [name] pressure issue 11/16; left foot ulcer dorsally again [name] very adherent necrotic surface. Right foot is about the same. Not much change in the pressure ulcer on his left buttock. 11/30; left foot ulcer dorsally basically the same as when I saw him 2 [name] ago. Very adherent fibrinous debris on the wound surface. Patient reports [name] lot  of  drainage as well. 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 We have been using Iodoflex, patient changed back to alginate because of the drainage. Area on his right dorsal foot really looks benign with [name] healthier surface certainly [name] lot better than on the left. Left buttock wounds all improved using Hydrofera Blue 12/7; left dorsal foot again no improvement. Tightly adherent debris. PCR culture I did last week only showed likely skin contaminant. I have gone ahead and done [name] punch biopsy of this which is about the last thing in terms of investigations I can think to do. He has known venous insufficiency and venous hypertension and this could be the issue here. The area on the right foot is about the same left buttock slightly worse according to our intake nurse secondary to [facility] Blue sticking to the wound 12/14; biopsy of the left foot that I did last time showed changes that could be related to wound healing/chronic stasis dermatitis phenomenon no neoplasm. We have been using silver alginate to both feet. I change the one on the left today to Sorbact and silver alginate to his other 2 wounds 12/28; the patient arrives with the following problems; Major issue is the dorsal left foot which continues to be [name] larger deeper wound area. Still with [name] completely nonviable surface Paradoxically the area mirror image on the right on the right dorsal foot appears to be getting better. He had some loss of dry denuded skin from the lower part of his original wound on the left lateral calf. Some of this area looked [name] little vulnerable and for this reason we put him in wrap that on this side this week The area on his left buttock is larger. He still has the erythematous circular area which I think is [name] combination of pressure, sweat. This does not look like cellulitis or fungal dermatitis 11/26/2019; -Dorsal left foot large open wound with depth. Still  debris over the surface. Using Sorbact The area on the dorsal right foot paradoxically has closed over He has [name] reopening on the left ankle laterally at the base of his original wound that extended up into the calf. This appears clean. The left buttock wound is smaller but with very adherent necrotic debris over the surface. We have been using silver alginate here as well The patient had arterial studies done in 2017. He had biphasic waveforms at the dorsalis pedis and posterior tibial bilaterally. ABI in the left was 1.17. Digit waveforms were dampened. He has slight spasticity in the great toes I do not think [name] TBI would be possible 1/11; the patient comes in today with [name] sizable reopening between the first and second toes on the right. This is not exactly in the same location where we have been treating wounds previously. According to our intake nurse this was actually fairly deep but 0.6 cm. The area on the left dorsal foot looks about the same the surface is somewhat cleaner using Sorbact, his MRI is in 2 days. We have not managed yet to get arterial studies. The new reopening on the left lateral calf looks somewhat better using alginate. The left buttock wound is about the same using alginate 1/18; the patient had his ARTERIAL studies which were quite normal. ABI in the right at 1.13 with triphasic/biphasic waveforms on the left ABI 1.06 again with triphasic/biphasic waveforms. It would not have been possible to have done [name] toe brachial index because of spasticity. We have  been using Sorbac to the left foot alginate to the rest of his wounds on the right foot left lateral calf and left buttock 1/25; arrives in clinic with erythema and swelling of the left forefoot worse over the first MTP area. This extends laterally dorsally and but also posteriorly. Still has an area on the left lateral part of the lower part of his calf wound it is eschared and clearly not closed. Area on the left buttock  still with surrounding irritation and erythema. Right foot surface wound dorsally. The area between the right and first and second toes appears better. 2/1; The left foot wound is about the same. Erythema slightly better I gave him [name] week of doxycycline empirically Right foot wound is more extensive extending between the toes to the plantar surface Left lateral calf really no open surface on the inferior part of his original wound however the entire area still looks vulnerable Absolutely no improvement in the left buttock wound required debridement. 2/8; the left foot is about the same. Erythema is slightly improved I gave him clindamycin last week. Right foot looks better he is using Lotrimin and silver alginate He has [name] breakdown in the left lateral calf. Denuded epithelium which I have removed Left buttock about the same were using Hydrofera Blue 2/15; left foot is about the same there is less surrounding erythema. Surface still has tightly adherent debris which I have debriding however not making any progress Right foot has [name] substantial wound on the medial right second toe between the first and second webspace. Still an open area on the left lateral calf distal area. Buttock wound is about the same 2/22; left foot is about the same less surrounding erythema. Surface has adherent debris. Polymen Ag Right foot area significant wound between the first and second toes. We have been using silver alginate here Left lateral leg polymen Ag at the base of his original venous insufficiency wound Left buttock some improvement here 3/1; Right foot is deteriorating in the first second toe webspace. Larger and more substantial. We have been using silver alginate. Left dorsal foot about the same markedly adherent surface debris using PolyMem Ag Left lateral calf surface debris using PolyMem AG Left buttock is improved again using PolyMem Ag. He is completing his terbinafine. The erythema in the foot  seems better. He has been on this for 2 [name] 3/8; no improvement in any wound area in fact he has [name] small open area on the dorsal midfoot which is new this week. He has not gotten his foot x-rays yet 3/15; his x-rays were both negative for osteomyelitis of both feet. No major change in any of his wounds on the extremities however his buttock wounds are better. We have been using polymen on the buttocks, left lower leg. Iodoflex on the left foot and silver alginate on the right 3/22; arrives in clinic today with the 2 major issues are the improvement in the left dorsal foot wound which for once actually looks healthy with [name] nice healthy wound surface without debridement. Using Iodoflex here. Unfortunately on the left lateral calf which is in the distal part of his original wound he came to the clinic here for there was purulent drainage noted some increased breakdown scattered around the original area and [name] small area proximally. We we are using polymen here will change to silver alginate today. His buttock wound on the left is better and I think the area on the right first second toe webspace is  also improved 3/29; left dorsal foot looks better. Using Iodoflex. Left ankle culture from deterioration last time grew [name]. coli, Enterobacter and Enterococcus. I will give him [name] course of cefdinir although that will not cover Enterococcus. The area on the right foot in the webspace of the first and second toe lateral first toe looks better. The area on his buttock is about healed Vascular appointment is on April 21. This is to look at his venous system vis--vis continued breakdown of the wounds on the left including the left lateral leg and left dorsal foot he. He has had previous ablations on this side 4/5; the area between the right first and second toes lateral aspect of the first toe looks better. Dorsal aspect of the left first toe on the left foot also improved. Unfortunately the left lateral lower leg  is larger and there is [name] second satellite wound superiorly. The usual superficial abrasions on the left buttock overall better but certainly not closed 4/12; the area between the right first and second toes is improved. Dorsal aspect of the left foot also slightly smaller with [name] vibrant healthy looking surface. No real change in the left lateral leg and the left buttock wound is healed He has an unaffordable co-pay for Apligraf. Appointment with vein and vascular with regards to the left leg venous part of the circulation is on 4/21 4/19; we continue to see improvement in all wound areas. Although this is minor. He has his vascular appointment on 4/21. The area on the left buttock has not reopened although right in the center of this area the skin looks somewhat threatened 4/26; the left buttock is unfortunately reopened. In general his left dorsal foot has [name] healthy surface and looks somewhat smaller although it was not measured as such. The area between his first and second toe webspace on the right as [name] small wound against the first toe. The patient saw vascular surgery. The real question I was asking was about the small saphenous vein on the left. He has previously ablated left greater saphenous vein. Nothing further was commented on on the left. Right greater saphenous vein without reflux at the saphenofemoral junction or proximal thigh there was no indication for ablation of the right greater saphenous vein duplex was negative for DVT bilaterally. They did not think there was anything from [name] point of view that could be offered. They ABIs within normal limits 5/3; only small open area on the left buttock. The area on the left lateral leg which was his original venous reflux is now 2 wounds both which look clean. We are using Iodoflex on the left dorsal foot which looks healthy and smaller. He is down to [name] very tiny area between the right first and second toes, using  silver alginate 5/10; all of his wounds appear better. We have much better edema control in 4 layer compression on the left. This may be the factor that is allowing the left foot and left lateral calf to heal. He has external compression garments at home 04/14/20-All of his wounds are progressing well, the left forefoot is practically closed, left ischium appears to be about the same, right toe webspace is also smaller. The left lateral leg is about the same, continue using Hydrofera Blue to this, silver alginate to the ischium, Iodoflex to the toe space on the right 6/7; most of his wounds outside of the left buttock are doing well. The area on the left lateral calf and left  dorsal foot are smaller. The area on the right foot in between the first and second toe webspace is barely visible although he still says there is some drainage here is the only reason I did not heal this out. Unfortunately the area on the left buttock almost looks like he has [name] skin tear from tape. He has open wound and then [name] large flap of skin that we are trying to get adherence over an area just next to the remaining wound 6/21; 2 week follow-up. I believe is been here for nurse visits. Miraculously the area between his first and second toes on the left dorsal foot is closed over. Still open on the right first second web space. The left lateral calf has 2 open areas. Distally this is more superficial. The proximal area had [name] little more depth and required debridement of adherent necrotic material. His buttock wound is actually larger we have been using silver alginate here 6/28; the patient's area on the left foot remains closed. Still open wet area between the first and second toes on the right and also extending into the plantar aspect. We have been using silver alginate in this location. He has 2 areas on the left lower leg part of his original long wounds which I think are better. We have been using Hydrofera Blue here.  Hydrofera Blue to the left buttock which is stable 7/12; left foot remains closed. Left ankle is closed. May be [name] small area between his right first and second toes the only truly open area is on the left buttock. We have been using Hydrofera Blue here 7/19; patient arrives with marked deterioration especially in the left foot and ankle. We did not put him in [name] compression wrap on the left last week in fact he wore his juxta lite stockings on either side although he does not have an underlying stocking. He has [name] reopening on the left dorsal foot, left lateral ankle and [name] new area on the right dorsal ankle. More worrisome is the degree of erythema on the left foot extending on the lateral foot into the lateral lower leg on the left 7/26; the patient had erythema and drainage from the lateral left ankle last week. Culture of this grew MRSA resistant to doxycycline and clindamycin which are the 2 antibiotics we usually use with this patient who has multiple antibiotic allergies including linezolid, trimethoprim sulfamethoxazole. I had give him an empiric doxycycline and he comes in the area certainly looks somewhat better although it is blotchy in his lower leg. He has not been systemically unwell. He has had areas on the left dorsal foot which is [name] reopening, chronic wounds on the left lateral ankle. Both of these I think are secondary to chronic venous insufficiency. The area between his first and second toes is closed as far as I can tell. He had [name] new wrap injury on the right dorsal ankle last week. Finally he has an area on the left buttock. We have been using silver alginate to everything except the left buttock we are using Hydrofera Blue 06/30/20-Patient returns at 1 week, has been given [name] sample dose pack of NUZYRA which is [name] tetracycline derivative [omadacycline], patient has completed those, we have been using silver alginate to almost all the wounds except the left ischium where we are using  Hydrofera Blue all of them look better 8/16; since I last saw the patient he has been doing well. The area on the left buttock, left lateral  ankle and left foot are all closed today. He has completed the [place] I gave him last time and tolerated this well. He still has open areas on the right dorsal ankle and in the right first second toe area which we are using silver alginate. 8/23; we put him in his bilateral external compression stockings last week as he did not have anything open on either leg except for concerning area between the right first and second toe. He comes in today with an area on the left dorsal foot slightly more proximal than the original wound, the left lateral foot but this is actually [name] continuation of the area he had on the left lateral ankle from last time. As well he is opened up on the left buttock again. 8/30; comes in today with things looking [name] lot better. The area on the left lower ankle has closed down as has the left foot but with eschar in both areas. The area on the dorsal right ankle is also epithelialized. Very little remaining of the left buttock wound. We have been using silver alginate on all wound areas 9/13; the area in the first second toe webspace on the right has fully epithelialized. He still has some vulnerable epithelium on the right and the ankle and the dorsal foot. He notes weeping. He is using his juxta lite stocking. On the left again the left dorsal foot is closed left lateral ankle is closed. We went to the juxta lite stocking here as well. Still vulnerable in the left buttock although only 2 small open areas remain here 9/27; 2-week follow-up. We did not look at his left leg but the patient says everything is closed. He is [name] bit disturbed by the amount of edema in his left foot he is using juxta lite stockings but asking about over the toes stockings which would be 30/40, will talk to him next time. According to him there is no open wound on  either the left foot or the left ankle/calf He has an open area on the dorsal right calf which I initially point [name] wrap injury. He has superficial remaining wound on the left ischial tuberosity been using silver alginate although he says this sticks to the wound 10/5; we gave him 2-week follow-up but he called yesterday expressing some concerns about his right foot right ankle and the left buttock. He came in early. There is still no open areas on the left leg and that still in his juxta lite stocking 10/11; he only has 1 small area on the left buttock that remains measuring millimeters 1 mm. Still has the same irritated skin in this area. We recommended zinc oxide when this eventually closes and pressure relief is meticulously is he can do this. He still has an area on the dorsal part of his right first through third toes which is [name] bit irritated and still open and on the dorsal ankle near the crease of the ankle. We have been using silver alginate and using his own stocking. He has nothing open on the left leg or foot 10/25; 2-week follow-up. Not nearly as good on the left buttock as I was hoping. For open areas with 5 looking threatened small. He has the erythematous irritated chronic skin in this area. 1 area on the right dorsal ankle. He reports this area bleeds easily Right dorsal foot just proximal to the base of his toes We have been using silver alginate. 11/8; 2-week follow-up. Left buttock is about the same  although I do not think the wounds are in the same location we have been using silver alginate. I have asked him to use zinc oxide on the skin around the wounds. He still has [name] small area on the right dorsal ankle he reports this bleeds easily Right dorsal foot just proximal to the base of the toes does not have anything open although the skin is very dry and scaly He has [name] new opening on the nailbed of the left great toe. Nothing on the left ankle 11/29; 3-week follow-up. Left  buttock has 2 open areas. And washing of these wounds today started bleeding easily. Suggesting very friable tissue. We have been using silver alginate. Right dorsal ankle which I thought was initially [name] wrap injury we have been using silver alginate. Nothing open between the toes that I can see. He states the area on the left dorsal toe nailbed healed after the last visit in 2 or 3 days 12/13; 3-week follow-up. His left buttock now has 3 open areas but the original 2 areas are smaller using polymen here. Surrounding skin looks better. The right dorsal ankle is closed. He has [name] small opening on the right dorsal foot at the level of the third toe. In general the skin looks better here. He is wearing his juxta lite stocking on the left leg says there is nothing open Electronic Signature(s) Signed: 11/06/2020 8:09:40 AM By: Linton Ham MD Entered By: Linton Ham on 11/03/2020 08:13:11 -------------------------------------------------------------------------------- Physical Exam Details Patient Name: Date of Service: [name] [name], [name] [name] 11/03/2020 7:30 [name] [name] Medical Record Number: [id number] Patient Account Number: [id number] Date of Birth/Sex: Treating RN: September 12, 1986 (34 y.o. [name] [name] Primary Care Provider: Lamar, Jones Other Clinician: Referring Provider: Treating Provider/Extender: [name] [name] in Treatment: 252 Constitutional Sitting or standing Blood Pressure is within target range for patient.. Pulse regular and within target range for patient.Marland Kitchen Respirations regular, non-labored and within target range.. Temperature is normal and within the target range for the patient.Marland Kitchen Appears in no distress. Notes Wound exam; Left buttock has 3 open areas. The original 2 areas are about the size of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 02/14/17; patient arrives today having completed doxycycline last Thursday. This was supposed to  be [name] nurse visit however once again he hasn't tense erythema from the  medial part of his wound extending over the lower leg. Also erythema in his foot this is roughly in the same distribution as last time. He has baseline chronic venous inflammation however this is [name] lot worse than the baseline I have learned to accept the on him is baseline inflammation 02/24/17- patient is here for follow-up evaluation. He is tolerating compression therapy. His voicing no complaints or concerns he is here anticipating an Apligraf 03/03/17; he arrives today with an adherent necrotic surface. I don't think this is surface is going to be amenable for Apligraf's. The erythema around his wound and on the left dorsal foot has resolved he is off antibiotics 03/10/17; better-looking surface today. I don't think he can tolerate Apligraf's. He tells me he had [name] wound VAC after [name] skin graft years ago to this area and they had difficulty with [name] seal. The erythema continues to be stable around this some degree of chronic venous inflammation but he also has recurrent cellulitis. We have been using Iodoflex 03/17/17; continued improvement in the surface and may be small changes in dimensions. Using Iodoflex which seems the only thing that will control his surface 03/24/17- He is here for follow up evaluation of his LLE lateral ulceration and ulcer to right dorsal foot/toe space. He is voicing no complaints or concerns, He is tolerating compression wrap. 03/31/17 arrives today with [name] much healthier looking wound on the left lower extremity. We have been using Iodoflex for [name] prolonged period of time which has for the first time prepared and adequate looking wound bed although we have not had much in the way of wound dimension improvement. He also has [name] small wound between the first and second toe on the right 04/07/17; arrives today with [name] healthy-looking wound bed and at least the top 50% of this wound appears to be now her. No debridement was required I have changed him to [facility] last week after  prolonged Iodoflex. He did not do well with Apligraf's. We've had [name] re-opening between the first and second toe on the right 04/14/17; arrives today with [name] healthier looking wound bed contractions and the top 50% of this wound and some on the lesser 50%. Wound bed appears healthy. The area between the first and second toe on the right still remains problematic 04/21/17; continued very gradual improvement. Using [facility] 04/28/17; continued very gradual improvement in the left lateral leg venous insufficiency wound. His periwound erythema is very mild. We have been using Hydrofera Blue. Wound is making progress especially in the superior 50% 05/05/17; he continues to have very gradual improvement in the left lateral venous insufficiency wound. Both in terms with an length rings are improving. I debrided this every 2 [name] with #5 curet and we have been using Hydrofera Blue and again making good progress With regards to the wounds between his right first and second toe which I thought might of been tinea pedis he is not making as much progress very dry scaly skin over the area. Also the area at the base of the left first and second toe in [name] similar condition 05/12/17; continued gradual improvement in the refractory left lateral venous insufficiency wound on the left. Dimension smaller. Surface still requiring debridement using Hydrofera Blue 05/19/17; continued gradual improvement in the refractory left lateral venous ulceration. Careful inspection of the wound bed underlying rumination suggested some degree of epithelialization over the surface no  debridement indicated. Continue Hydrofera Blue difficult areas between his toes first and third on the left than first and second on the right. I'[name] going to change to silver alginate from silver collagen. Continue ketoconazole as I suspect underlying tinea pedis 05/26/17; left lateral leg venous insufficiency wound. We've been using Hydrofera Blue. I believe  that there is expanding epithelialization over the surface of the wound albeit not coming from the wound circumference. This is [name] bit of an odd situation in which the epithelialization seems to be coming from the surface of the wound rather than in the exact circumference. There is still small open areas mostly along the lateral margin of the wound. ooHe has unchanged areas between the left first and second and the right first second toes which I been treating for tenia pedis 06/02/17; left lateral leg venous insufficiency wound. We have been using Hydrofera Blue. Somewhat smaller from the wound circumference. The surface of the wound remains [name] bit on it almost epithelialized sedation in appearance. I use an open curette today debridement in the surface of all of this especially the edges ooSmall open wounds remaining on the dorsal right first and second toe interspace and the plantar left first second toe and her face on the left 06/09/17; wound on the left lateral leg continues to be smaller but very gradual and very dry surface using Hydrofera Blue 06/16/17 requires weekly debridements now on the left lateral leg although this continues to contract. I changed to silver collagen last week because of dryness of the wound bed. Using Iodoflex to the areas on his first and second toes/web space bilaterally 06/24/17; patient with history of paraplegia also chronic venous insufficiency with lymphedema. Has [name] very difficult wound on the left lateral leg. This has been gradually reducing in terms of with but comes in with [name] very dry adherent surface. High switch to silver collagen [name] week or so ago with hydrogel to keep the area moist. This is been refractory to multiple dressing attempts. He also has areas in his first and second toes bilaterally in the anterior and posterior web space. I had been using Iodoflex here after [name] prolonged course of silver alginate with ketoconazole was ineffective [question tinea  pedis] 07/14/17; patient arrives today with [name] very difficult adherent material over his left lateral lower leg wound. He also has surrounding erythema and poorly controlled edema. He was switched his Santyl last visit which the nurses are applying once during his doctor visit and once on [name] nurse visit. He was also reduced to 2 layer compression I'[name] not exactly sure of the issue here. 07/21/17; better surface today after 1 week of Iodoflex. Significant cellulitis that we treated last week also better. [Doxycycline] 07/28/17 better surface today with now 2 [name] of Iodoflex. Significant cellulitis treated with doxycycline. He has now completed the doxycycline and he is back to his usual degree of chronic venous inflammation/stasis dermatitis. He reminds me he has had ablations surgery here 08/04/17; continued improvement with Iodoflex to the left lateral leg wound in terms of the surface of the wound although the dimensions are better. 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 Problematic areas on the plantar aspect of the first second toe web space on the left and the dorsal aspect of the first second toe web space on the right. At one point I felt these were probably related to chronic fungal infections in treated him aggressively for this  although we have not made any improvement here. 08/11/17; left lateral leg. Surface continues to improve with the Iodoflex although we are not seeing much improvement in overall wound dimensions. Areas on his plantar left foot and right foot show no improvement. In fact the right foot looks somewhat worse 08/18/17; left lateral leg. We changed to [facility] Blue last week after [name] prolonged course of Iodoflex which helps get the surface better. It appears that the wound with is improved. Continue with difficult areas on the left dorsal first second and plantar first second on the right 09/01/17; patient  arrives in clinic today having had [name] temperature of 103 yesterday. He was seen in the ER and [facility] The patient was concerned he could have cellulitis again in the right leg however they diagnosed him with [name] UTI and he is now on Keflex. He has [name] history of cellulitis which is been recurrent and difficult but this is been in the left leg, in the past 5 use doxycycline. He does in and out catheterizations at home which are risk factors for UTI 09/08/17; patient will be completing his Keflex this weekend. The erythema on the left leg is considerably better. He has [name] new wound today on the medial part of the right leg small superficial almost looks like [name] skin tear. He has worsening of the area on the right dorsal first and second toe. His major area on the left lateral leg is better. Using Hydrofera Blue on all areas 09/15/17; gradual reduction in width on the long wound in the left lateral leg. No debridement required. He also has wounds on the plantar aspect of his left first second toe web space and on the dorsal aspect of the right first second toe web space. 09/22/17; there continues to be very gradual improvements in the dimensions of the left lateral leg wound. He hasn't round erythematous spot with might be pressure on his wheelchair. There is no evidence obviously of infection no purulence no warmth ooHe has [name] dry scaled area on the plantar aspect of the left first second toe ooImproved area on the dorsal right first second toe. 09/29/17; left lateral leg wound continues to improve in dimensions mostly with an is still [name] fairly long but increasingly narrow wound. ooHe has [name] dry scaled area on the plantar aspect of his left first second toe web space ooIncreasingly concerning area on the dorsal right first second toe. In fact I am concerned today about possible cellulitis around this wound. The areas extending up his second toe and although there is deformities here almost appears to abut on  the nailbed. 10/06/17; left lateral leg wound continues to make very gradual progress. Tissue culture I did from the right first second toe dorsal foot last time grew MRSA and enterococcus which was vancomycin sensitive. This was not sensitive to clindamycin or doxycycline. He is allergic to Zyvox and sulfa we have therefore arrange for him to have dalvance infusion tomorrow. He is had this in the past and tolerated it well 10/20/17; left lateral leg wound continues to make decent progress. This is certainly reduced in terms of with there is advancing epithelialization.ooThe cellulitis in the right foot looks better although he still has [name] deep wound in the dorsal aspect of the first second toe web space. Plantar left first toe web space on the left I think is making some progress 10/27/17; left lateral leg wound continues to make decent progress. Advancing epithelialization.using Hydrofera Blue ooThe right first second  toe web space wound is better-looking using silver alginate ooImprovement in the left plantar first second toe web space. Again using silver alginate 11/03/17 left lateral leg wound continues to make decent progress albeit slowly. Using Hydrofera Blue ooThe right per second toe web space continues to be [name] very problematic looking punched out wound. I obtained [name] piece of tissue for deep culture I did extensively treated this for fungus. It is difficult to imagine that this is [name] pressure area as the patient states other than going outside he doesn't really wear shoes at home ooThe left plantar first second toe web space looked fairly senescent. Necrotic edges. This required debridement oochange to Hydrofera Blue to all wound areas 11/10/17; left lateral leg wound continues to contract. Using Hydrofera Blue ooOn the right dorsal first second toe web space dorsally. Culture I did of this area last week grew MRSA there is not an easy oral option in this patient was multiple  antibiotic allergies or intolerances. This was only [name] rare culture isolate I'[name] therefore going to use Bactroban under silver alginate ooOn the left plantar first second toe web space. Debridement is required here. This is also unchanged 11/17/17; left lateral leg wound continues to contract using Hydrofera Blue this is no longer the major issue. ooThe major concern here is the right first second toe web space. He now has an open area going from dorsally to the plantar aspect. There is now wound on the inner lateral part of the first toe. Not [name] very viable surface on this. There is erythema spreading medially into the forefoot. ooNo major change in the left first second toe plantar wound 11/24/17; left lateral leg wound continues to contract using Hydrofera Blue. Nice improvement today ooThe right first second toe web space all of this looks [name] lot less angry than last week. I have given him clindamycin and topical Bactroban for MRSA and terbinafine for the possibility of underlining tinea pedis that I could not control with ketoconazole. Looks somewhat better ooThe area on the plantar left first second toe web space is weeping with dried debris around the wound 12/01/17; left lateral leg wound continues to contract he Hydrofera Blue. It is becoming thinner in terms of with nevertheless it is making good improvement. ooThe right first second toe web space looks less angry but still [name] large necrotic-looking wounds starting on the plantar aspect of the right foot extending between the toes and now extensively on the base of the right second toe. I gave him clindamycin and topical Bactroban for MRSA anterior benefiting for the possibility of underlying tinea pedis. Not looking better today ooThe area on the left first/second toe looks better. Debrided of necrotic debris 12/05/17* the patient was worked in urgently today because over the weekend he found blood on his incontinence bad when he woke up. He  was found to have an ulcer by his wife who does most of his wound care. He came in today for [place] to look at this. He has not had [name] history of wounds in his buttocks in spite of his paraplegia. 12/08/17; seen in follow-up today at his usual appointment. He was seen earlier this week and found to have [name] new wound on his buttock. We also follow him for wounds on the left lateral leg, left first second toe web space and right first second toe web space 12/15/17; we have been using Hydrofera Blue to the left lateral leg which has improved. The right first second toe  web space has also improved. Left first second toe web space plantar aspect looks stable. The left buttock has worsened using Santyl. Apparently the buttock has drainage 12/22/17; we have been using Hydrofera Blue to the left lateral leg which continues to improve now 2 small wounds separated by normal skin. He tells [place] he had [name] fever up to 100 yesterday he is prone to UTIs but has not noted anything different. He does in and out catheterizations. The area between the first and second toes today does not look good necrotic surface covered with what looks to be purulent drainage and erythema extending into the third toe. I had gotten this to something that I thought look better last time however it is not look good today. He also has [name] necrotic surface over the buttock wound which is expanded. I thought there might be infection under here so I removed [name] lot of the surface with [name] #5 curet though nothing look like it really needed culturing. He is been using Santyl to this area 12/27/17; his original wound on the left lateral leg continues to improve using Hydrofera Blue. I gave him samples of Baxdella although he was unable to take them out of fear for an allergic reaction ["lump in his throat"].the culture I did of the purulent drainage from his second toe last week showed both enterococcus and [name] set Enterobacter I was also concerned about the  erythema on the bottom of his foot although paradoxically although this looks somewhat better today. 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 We've been using silver alginate here. They came up today that he sleeps in [name] recliner, I'[name] not sure why but I asked him to stop this 01/03/18; his original wound we've been using Hydrofera Blue is now separated into 2 areas. ooUlcer on his left buttock is better he is off the recliner and sleeping in bed ooFinally both wound areas between his first and second toes also looks some better 01/10/18; his original wound on the left lateral leg is now separated into 2 wounds we've been using Hydrofera Blue ooUlcer on his left buttock has some drainage. There is [name] small probing site going into muscle layer superiorly.using silver alginate -He arrives today with [name] deep tissue injury on the left heel ooThe wound on the dorsal aspect of his first second toe on the left looks [name] lot betterusing silver alginate ketoconazole ooThe area on the first second toe web space on the right also looks [name] lot bette 01/17/18; his original wound on the left lateral leg continues to progress using Hydrofera Blue ooUlcer on his left buttock also is smaller surface healthier except for [name] small probing site going into the muscle layer superiorly. 2.4 cm of tunneling in this area ooDTI on his left heel we have only been offloading. Looks better than last week no threatened open no evidence of infection oothe wound on the dorsal aspect of the first second toe on the left continues to look like it's regressing we have only been using silver alginate and terbinafine orally ooThe area in the first second toe web space on the right also looks to be [name] lot better using silver alginate and terbinafine I think this was prompted by tinea pedis 01/31/18; the patient was hospitalized in [place] last  week apparently for [name] complicated UTI. He was discharged on cefepime he does in and out catheterizations. In the  hospital he was discovered [name] I don't mild elevation of AST and ALT and the terbinafine was stopped.predictably the pressure ulcer on s his buttock looks betterusing silver alginate. The area on the left lateral leg also is better using Hydrofera Blue. The area between the first and second toes on the left better. First and second toes on the right still substantial but better. Finally the DTI on the left heel has held together and looks like it's resolving 02/07/18-he is here in follow-up evaluation for multiple ulcerations. He has new injury to the lateral aspect of the last issue [name] pressure ulcer, he states this is from adhesive removal trauma. He states he has tried multiple adhesive products with no success. All other ulcers appear stable. The left heel DTI is resolving. We will continue with same treatment plan and follow-up next week. 02/14/18; follow-up for multiple areas. ooHe has [name] new area last week on the lateral aspect of his pressure ulcer more over the posterior trochanter. The original pressure ulcer looks quite stable has healthy granulation. 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 Almost fully epithelialized on the original superior area using Hydrofera Blue ooDTI on the left heel has peeled off this week to reveal [name] small superficial wound under denuded skin and subcutaneous tissue ooBoth areas between the first and second toes look better including nothing open on the left 02/21/18; ooThe patient's wounds on his left ischial tuberosity and posterior left greater trochanter actually looked better. He has [name] large area of irritation around the area which I think is contact dermatitis. I am doubtful that this is fungal ooHis original wound on the left lateral calf  continues to improve we have been using Hydrofera Blue ooThere is no open area in the left first second toe web space although there is [name] lot of thick callus ooThe DTI on the left heel required debridement today of necrotic surface eschar and subcutaneous tissue using silver alginate ooFinally the area on the right first second toe webspace continues to contract using silver alginate and ketoconazole 02/28/18 ooLeft ischial tuberosity wounds look better using silver alginate. ooOriginal wound on the left calf only has one small open area left using Hydrofera Blue ooDTI on the left heel required debridement mostly removing skin from around this wound surface. Using silver alginate ooThe areas on the right first/second toe web space using silver alginate and ketoconazole 03/08/18 on evaluation today patient appears to be doing decently well as best I can tell in regard to his wounds. This is the first time that I have seen him as he generally is followed by Dr. Dellia Nims. With that being said none of his wounds appear to be infected he does have an area where there is some skin covering what appears to be [name] new wound on the left dorsal surface of his great toe. This is right at the nail bed. With that being said I do believe that debrided away some of the excess skin can be of benefit in this regard. Otherwise he has been tolerating the dressing changes without complication. 03/14/18; patient arrives today with the multiplicity of wounds that we are following. He has not been systemically unwell ooOriginal wound on the left lateral calf now only has 2 small open areas we've been using Hydrofera Blue which should continue ooThe deep tissue injury on the left heel requires debridement today. We've been using silver alginate   ooThe left first second toe and the right first second toe are both are reminiscence what I think was tinea pedis. Apparently some of the callus Surface between the toes was  removed last week when it started draining. ooPurulent drainage coming from the wound on the ischial tuberosity on the left. 03/21/18-He is here in follow-up evaluation for multiple wounds. 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 He tolerated debridement. The only change to last week's recommendations is to discontinue antifungal cream between toes. He will follow-up next week 03/28/18; following up for multiple wounds;Concern this week is streaking redness and swelling in the right foot. He is going to need antibiotics for this. 03/31/18; follow-up for right foot cellulitis. Streaking redness and swelling in the right foot on 03/28/18. He has multiple antibiotic intolerances and [name] history of MRSA. I put him on clindamycin 300 mg every 6 and brought him in for [name] quick check. He has an open wound between his first and second toes on the right foot as [name] potential source. 04/04/18; ooRight foot cellulitis is resolving he is completing clindamycin. This is truly good news ooLeft lateral calf wound which is initial wound only has one small open area inferiorly this is close to healing out. He has compression stockings. We will use Hydrofera Blue right down to the epithelialization of this ooNonviable surface on the left heel which was initially pressure with [name] DTI. We've been using Hydrofera Blue. I'[name] going to switch this back to silver alginate ooLeft first second toe/tinea pedis this looks better using silver alginate ooRight first second toe tinea pedis using silver alginate ooLarge pressure ulcers on theLeft ischial tuberosity. Small wound here Looks better. I am uncertain about the surface over the large wound. Using silver alginate 04/11/18; ooCellulitis in the right foot is resolved ooLeft lateral calf wound which was his original wounds still has 2 tiny open areas remaining this is just about closed ooNonviable  surface on the left heel is better but still requires debridement ooLeft first second toe/tinea pedis still open using silver alginate ooRight first second toe wound tinea pedis I asked him to go back to using ketoconazole and silver alginate ooLarge pressure ulcers on the left ischial tuberosity this shear injury here is resolved. Wound is smaller. No evidence of infection using silver alginate 04/18/18; ooPatient arrives with an intense area of cellulitis in the right mid lower calf extending into the right heel area. Bright red and warm. Smaller area on the left anterior leg. He has [name] significant history of MRSA. He will definitely need antibioticsoodoxycycline ooHe now has 2 open areas on the left ischial tuberosity the original large wound and now [name] satellite area which I think was above his initial satellite areas. Not [name] wonderful surface on this satellite area surrounding erythema which looks like pressure related. ooHis left lateral calf wound again his original wound is just about closed ooLeft heel pressure injury still requiring debridement ooLeft first second toe looks [name] lot better using silver alginate ooRight first second toe also using silver alginate and ketoconazole cream also looks better 04/20/18; the patient was worked in early today out of concerns with his cellulitis on the right leg. I had started him on doxycycline. This was 2 days ago. His wife was concerned about the swelling in the area. Also concerned about the left buttock. He has not been systemically unwell no fever chills. No nausea vomiting or diarrhea 04/25/18; the patient's  left buttock wound is continued to deteriorate he is using Hydrofera Blue. He is still completing clindamycin for the cellulitis on the right leg although all of this looks better. 05/02/18 ooLeft buttock wound still with [name] lot of drainage and [name] very tightly adherent fibrinous necrotic surface. He has [name] deeper area superiorly ooThe  left lateral calf wound is still closed ooDTI wound on the left heel necrotic surface especially the circumference using Iodoflex ooAreas between his left first second toe and right first second toe both look better. Dorsally and the right first second toe he had [name] necrotic surface although at smaller. In using silver alginate and ketoconazole. I did [name] culture last week which was [name] deep tissue culture of the reminiscence of the open wound on the right first second toe dorsally. This grew [name] few Acinetobacter and [name] few methicillin-resistant staph aureus. Nevertheless the area actually this week looked better. I didn't feel the need to specifically address this at least in terms of systemic antibiotics. 05/09/18; wounds are measuring larger more drainage per our intake. We are using Santyl covered with alginate on the large superficial buttock wounds, Iodosorb on the left heel, ketoconazole and silver alginate to the dorsal first and second toes bilaterally. 05/16/18; ooThe area on his left buttock better in some aspects although the area superiorly over the ischial tuberosity required an extensive debridement.using Santyl ooLeft heel appears stable. Using Iodoflex ooThe areas between his first and second toes are not bad however there is spreading erythema up the dorsal aspect of his left foot this looks like cellulitis again. He is insensate the erythema is really very brilliant.o Erysipelas He went to see an allergist days ago because he was itching part of this he had lab work done. This showed [name] white count of 15.1 with 70% neutrophils. Hemoglobin of 11.4 and [name] platelet count of 659,000. Last white count we had in [facility] was [name] 2-1/2 years ago which was 25.9 but he was ill at the time. He was able to show me some lab work that was done by his primary physician the pattern is about the same. 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 But prompted me to  go ahead and do x-rays of both feet and the pelvis rule out osteomyelitis. He also had [name] comprehensive metabolic panel this was reasonably normal his albumin was 3.7 liver function tests BUN/creatinine all normal 05/23/18; x-rays of both his feet from last week were negative for underlying pulmonary abnormality. The x-ray of his pelvis however showed mild irregularity in the left ischial which may represent some early osteomyelitis. The wound in the left ischial continues to get deeper clearly now exposed muscle. Each week necrotic surface material over this area. Whereas the rest of the wounds do not look so bad. ooThe left ischial wound we have been using Santyl and calcium alginate ooT the left heel surface necrotic debris using Iodoflex o ooThe left lateral leg is still healed ooAreas on the left dorsal foot and the right dorsal foot are about the same. There is some inflammation on the left which might represent contact dermatitis, fungal dermatitis I am doubtful cellulitis although this looks better than last week 05/30/18; CT scan done at Hospital did not show any osteomyelitis or abscess. Suggested the possibility of underlying cellulitis although I don't see [name] lot of evidence of this at the bedside ooThe wound itself on the left buttock/upper thigh actually looks somewhat  better. No debridement ooLeft heel also looks better no debridement continue Iodoflex ooBoth dorsal first second toe spaces appear better using Lotrisone. Left still required debridement 06/06/18; ooIntake reported some purulent looking drainage from the left gluteal wound. Using Santyl and calcium alginate ooLeft heel looks better although still [name] nonviable surface requiring debridement ooThe left dorsal foot first/second webspace actually expanding and somewhat deeper. I may consider doing [name] shave biopsy of this area ooRight dorsal foot first/second webspace appears stable to improved. Using Lotrisone and silver  alginate to both these areas 06/13/18 ooLeft gluteal surface looks better. Now separated in the 2 wounds. No debridement required. Still drainage. We'll continue silver alginate ooLeft heel continues to look better with Iodoflex continue this for at least another week ooOf his dorsal foot wounds the area on the left still has some depth although it looks better than last week. We've been using Lotrisone and silver alginate 06/20/18 ooLeft gluteal continues to look better healthy tissue ooLeft heel continues to look better healthy granulation wound is smaller. He is using Iodoflex and his long as this continues continue the Iodoflex ooDorsal right foot looks better unfortunately dorsal left foot does not. There is swelling and erythema of his forefoot. He had minor trauma to this several days ago but doesn't think this was enough to have caused any tissue injury. Foot looks like cellulitis, we have had this problem before 06/27/18 on evaluation today patient appears to be doing [name] little worse in regard to his foot ulcer. 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 Both of which would really be his only options for treating this infection. In the past he has been given and effusion of Orbactiv. This is done very well for him in the past again it's one time dosing IV antibiotic therapy. Subsequently I do believe this is something we're gonna need to see about doing at this point in time. Currently his other wounds seem to be doing somewhat better in my pinion I'[name] pretty happy in that regard. 07/03/18 on evaluation today patient's wounds actually appear to be doing fairly well. He has been tolerating the dressing changes without complication. All in all he seems to be showing signs of improvement. In regard to the antibiotics he has been dealing with infectious  disease since I saw him last week as far as getting this scheduled. In the end [facility]'s [facility] this coming Friday. In the meantime he has been continuing to perform the dressing changes in such as previous. There does not appear to be any evidence of infection worsengin at this time. 07/10/18; ooSince I last saw this man 2 [name] ago things have actually improved. IV antibiotics of resulted in less forefoot erythema although there is still some present. He is not systemically unwell ooLeft buttock wounds o2 now have no depth there is increased epithelialization Using silver alginate ooLeft heel still requires debridement using Iodoflex ooLeft dorsal foot still with [name] sizable wound about the size of [name] border but healthy granulation ooRight dorsal foot still with [name] slitlike area using silver alginate 07/18/18; the patient's cellulitis in the left foot is improved in fact I think it is on its way to resolving. ooLeft buttock wounds o2 both look better although the larger one has hypertension granulation we've been using  silver alginate ooLeft heel has some thick circumferential redundant skin over the wound edge which will need to be removed today we've been using Iodoflex ooLeft dorsal foot is still [name] sizable wound required debridement using silver alginate ooThe right dorsal foot is just about closed only [name] small open area remains here 07/25/18; left foot cellulitis is resolved ooLeft buttock wounds o2 both look better. Hyper-granulation on the major area ooLeft heel as some debris over the surface but otherwise looks [name] healthier wound. Using silver collagen ooRight dorsal foot is just about closed 07/31/18; arrives with our intake nurse worried about purulent drainage from the buttock. We had hyper-granulation here last week ooHis buttock wounds o2 continue to look better ooLeft heel some debris over the surface but measuring  smaller. ooRight dorsal foot unfortunately has openings between the toes ooLeft foot superficial wound looks less aggravated. 08/07/18 ooButtock wounds continue to look better although some of her granulation and the larger medial wound. silver alginate ooLeft heel continues to look [name] lot better.silver collagen ooLeft foot superficial wound looks less stable. Requires debridement. He has [name] new wound superficial area on the foot on the lateral dorsal foot. ooRight foot looks better using silver alginate without Lotrisone 08/14/2018; patient was in the ER last week diagnosed with [name] UTI. He is now on Cefpodoxime and Macrodantin. ooButtock wounds continued to be smaller. Using silver alginate ooLeft heel continues to look better using silver collagen ooLeft foot superficial wound looks as though it is improving ooRight dorsal foot area is just about healed. 08/21/2018; patient is completed his antibiotics for his UTI. ooHe has 2 open areas on the buttocks. There is still not closed although the surface looks satisfactory. Using silver alginate ooLeft heel continues to improve using silver collagen ooThe bilateral dorsal foot areas which are at the base of his first and second toes/possible tinea pedis are actually stable on the left but worse on the right. The area on the left required debridement of necrotic surface. After debridement I obtained [name] specimen for PCR culture. ooThe right dorsal foot which is been just about healed last week is now reopened 08/28/2018; culture done on the left dorsal foot showed coag negative staph both staph epidermidis and Lugdunensis. I think this is worthwhile initiating systemic treatment. I will use doxycycline given his long list of allergies. The area on the left heel slightly improved but still requiring debridement. ooThe large wound on the buttock is just about closed whereas the smaller one is larger. Using silver alginate in this  area 09/04/2018; patient is completing his doxycycline for the left foot although this continues to be [name] very difficult wound area with very adherent necrotic debris. We are using silver alginate to all his wounds right foot left foot and the small wounds on his buttock, silver collagen on the left heel. 09/11/2018; once again this patient has intense erythema and swelling of the left forefoot. Lesser degrees of erythema in the right foot. He has [name] long list of allergies and intolerances. I will reinstitute doxycycline. 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 Using silver alginate ooLeft heel also looks better using silver collagen ooUnfortunately both the areas on his feet look worse. The area on the left first second webspace is now gone through to the plantar part of his foot. The area on the left foot anteriorly is irritated with erythema and swelling in the forefoot. 09/25/2018 ooHis wound on the  left plantar heel looks better. Using silver collagen ooThe area on the left buttock 2 small remnant areas. One is closed one is still open. Using silver alginate ooThe areas between both his first and second toes look worse. This in spite of long-standing antifungal therapy with ketoconazole and silver alginate which should have antifungal activity ooHe has small areas around his original wound on the left calf one is on the bottom of the original scar tissue and one superiorly both of these are small and superficial but again given wound history in this site this is worrisome 10/02/2018 ooLeft plantar heel continues to gradually contract using silver collagen ooLeft buttock wound is unchanged using silver alginate ooThe areas on his dorsal feet between his first and second toes bilaterally look about the same. I prescribed clindamycin ointment to see if we can address chronic staph colonization and also the underlying possibility of  erythrasma ooThe left lateral lower extremity wound is actually on the lateral part of his ankle. Small open area here. We have been using silver alginate 10/09/2018; ooLeft plantar heel continues to look healthy and contract. No debridement is required ooLeft buttock slightly smaller with [name] tape injury wound just below which was new this week ooDorsal feet somewhat improved I have been using clindamycin ooLeft lateral looks lower extremity the actual open area looks worse although [name] lot of this is epithelialized. I am going to change to silver collagen today He has [name] lot more swelling in the right leg although this is not pitting not red and not particularly warm there is [name] lot of spasm in the right leg usually indicative of people with paralysis of some underlying discomfort. We have reviewed his vascular status from 2017 he had [name] left greater saphenous vein ablation. I wonder about referring him back to vascular surgery if the area on the left leg continues to deteriorate. 10/16/2018 in today for follow-up and management of multiple lower extremity ulcers. His left Buttock wound is much lower smaller and almost closed completely. The wound to the left ankle has began to reopen with Epithelialization and some adherent slough. He has multiple new areas to the left foot and leg. The left dorsal foot without much improvement. Wound present between left great webspace and 2nd toe. Erythema and edema present right leg. Right LE ultrasound obtained on 10/10/18 was negative for DVT . 10/23/2018; ooLeft buttock is closed over. Still dry macerated skin but there is no open wound. I suspect this is chronic pressure/moisture ooLeft lateral calf is quite [name] bit worse than when I saw this last. There is clearly drainage here he has macerated skin into the left plantar heel. We will change the primary dressing to alginate ooLeft dorsal foot has some improvement in overall wound area. Still using  clindamycin and silver alginate ooRight dorsal foot about the same as the left using clindamycin and silver alginate ooThe erythema in the right leg has resolved. He is DVT rule out was negative ooLeft heel pressure area required debridement although the wound is smaller and the surface is health 10/26/2018 ooThe patient came back in for his nurse check today predominantly because of the drainage coming out of the left lateral leg with [name] recent reopening of his original wound on the left lateral calf. He comes in today with [name] large amount of surrounding erythema around the wound extending from the calf into the ankle and even in the area on the dorsal foot. He is not systemically unwell. He is  not febrile. Nevertheless this looks like cellulitis. We have been using silver alginate to the area. I changed him to [name] regular visit and I am going to prescribe him doxycycline. The rationale here is [name] long list of medication intolerances and [name] history of MRSA. I did not see anything that I thought would provide [name] valuable culture 10/30/2018 ooFollow-up from his appointment 4 days ago with really an extensive area of cellulitis in the left calf left lateral ankle and left dorsal foot. I put him on doxycycline. He has [name] long list of medication allergies which are true allergy reactions. 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 In any case he is [name] lot better today. The erythema especially in the anterior and lateral left calf is better. He still has left ankle erythema. He also is complaining about increasing edema in the right leg we have only been using Kerlix Coban and he has been doing the wraps at home. Finally he has [name] spotty rash on the medial part of his upper left calf which looks like folliculitis or perhaps wrap occlusion type injury. Small superficial macules not pustules 11/06/18 patient arrives today with again [name]  considerable degree of erythema around the wound on the left lateral calf extending into the dorsal ankle and dorsal foot. This is [name] lot worse than when I saw this last week. He is on doxycycline really with not [name] lot of improvement. He has not been systemically unwell Wounds on the; left heel actually looks improved. Original area on the left foot and proximity to the first and second toes looks about the same. He has superficial areas on the dorsal foot, anterior calf and then the reopening of his original wound on the left lateral calf which looks about the same ooThe only area he has on the right is the dorsal webspace first and second which is smaller. ooHe has [name] large area of dry erythematous skin on the left buttock small open area here. 11/13/2018; the patient arrives in much better condition. The erythema around the wound on the left lateral calf is [name] lot better. Not sure whether this was the clindamycin or the TCA and ketoconazole or just in the improvement in edema control [stasis dermatitis]. In any case this is [name] lot better. The area on the left heel is very small and just about resolved using silver collagen we have been using silver alginate to the areas on his dorsal feet 11/20/2018; his wounds include the left lateral calf, left heel, dorsal aspects of both feet just proximal to the first second webspace. He is stable to slightly improved. I did not think any changes to his dressings were going to be necessary 11/27/2018 he has [name] reopening on the left buttock which is surrounded by what looks like tinea or perhaps some other form of dermatitis. The area on the left dorsal foot has some erythema around it I have marked this area but I am not sure whether this is cellulitis or not. Left heel is not closed. Left calf the reopening is really slightly longer and probably worse 1/13; in general things look better and smaller except for the left dorsal foot. Area on the left heel is just  about closed, left buttock looks better only [name] small wound remains in the skin looks better [using Lotrisone] 1/20; the area on the left heel only has [name] few remaining open areas here. Left lateral calf  about the same in terms of size, left dorsal foot slightly larger right lateral foot still not closed. The area on the left buttock has no open wound and the surrounding skin looks [name] lot better 1/27; the area on the left heel is closed. Left lateral calf better but still requiring extensive debridements. The area on his left buttock is closed. He still has the open areas on the left dorsal foot which is slightly smaller in the right foot which is slightly expanded. We have been using Iodoflex on these areas as well 2/3; left heel is closed. Left lateral calf still requiring debridement using Iodoflex there is no open area on his left buttock however he has dry scaly skin over [name] large area of this. Not really responding well to the Lotrisone. Finally the areas on his dorsal feet at the level of the first second webspace are slightly smaller on the right and about the same on the left. Both of these vigorously debrided with Anasept and gauze 2/10; left heel remains closed he has dry erythematous skin over the left buttock but there is no open wound here. Left lateral leg has come in and with. Still requiring debridement we have been using Iodoflex here. Finally the area on the left dorsal foot and right dorsal foot are really about the same extremely dry callused fissured areas. He does not yet have [name] dermatology appointment 2/17; left heel remains closed. He has [name] new open area on the left buttock. The area on the left lateral calf is bigger longer and still covered in necrotic debris. No major change in his foot areas bilaterally. I am awaiting for [name] dermatologist to look on this. We have been using ketoconazole I do not know that this is been doing any good at all. 2/24; left heel remains closed. The  left buttock wound that was new reopening last week looks better. The left lateral calf appears better also although still requires debridement. The major area on his foot is the left first second also requiring debridement. We have been putting Prisma on all wounds. I do not believe that the ketoconazole has done too much good for his feet. He will use Lotrisone I am going to give him [name] 2-week course of terbinafine. We still do not have [name] dermatology appointment 3/2 left heel remains closed however there is skin over bone in this area I pointed this out to him today. The left buttock wound is epithelialized but still does not look completely stable. The area on the left leg required debridement were using silver collagen here. With regards to his feet we changed to Lotrisone last week and silver alginate. 3/9; left heel remains closed. Left buttock remains closed. The area on the right foot is essentially closed. The left foot remains unchanged. Slightly smaller on the left lateral calf. Using silver collagen to both of these areas 3/16-Left heel remains closed. Area on right foot is closed. Left lateral calf above the lateral malleolus open wound requiring debridement with easy bleeding. Left dorsal wound proximal to first toe also debrided. Left ischial area open new. Patient has been using Prisma with wrapping every 3 days. Dermatology appointment is apparently tomorrow.Patient has completed his terbinafine 2-week course with some apparent improvement according to him, there is still flaking and dry skin in his foot on the left 3/23; area on the right foot is reopened. The area on the left anterior foot is about the same still [name] very necrotic adherent surface.  He still has the area on the left leg and reopening is on the left buttock. He apparently saw dermatology although I do not have [name] note. According to the patient who is usually fairly well informed they did not have any good ideas. Put him  on oral terbinafine which she is been on before. 3/30; using silver collagen to all wounds. Apparently his dermatologist put him on doxycycline and rifampin presumably some culture grew staph. I do not have this result. He remains on terbinafine although I have used terbinafine on him before 4/6; patient has had [name] fairly substantial reopening on the right foot between the first and second toes. He is finished his terbinafine and I believe is on doxycycline and rifampin still as prescribed by dermatology. We have been using silver collagen to all his wounds although the patient reports that he thinks silver alginate does better on the wounds on his buttock. 4/13; the area on his left lateral calf about the same size but it did not require debridement. ooLeft dorsal foot just proximal to the webspace between the first and second toes is about the same. Still nonviable surface. I note some superficial bronze discoloration of the dorsal part of his foot ooRight dorsal foot just proximal to the first and second toes also looks about the same. I still think there may be the same discoloration I noted above on the left ooLeft buttock wound looks about the same 4/20; left lateral calf appears to be gradually contracting using silver collagen. 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 The left dorsal foot wound is debrided of tightly adherent necrotic debris and really cleans up quite nicely. The right area is worse with expansion. I did not debride this it is now over the base of the second toe ooThe area on his left buttock is smaller no debridement is required using silver collagen 5/4; left calf continues to make good progress. ooHe arrives with erythema around the wounds on his dorsal foot which even extends to the plantar aspect. Very concerning for coexistent infection. He is finished the erythromycin I gave him for possible erythrasma  this does not seem to have helped. ooThe area on the left foot is about the same base of the dorsal toes ooIs area on the buttock looks improved on the left 5/11; left calf and left buttock continued to make good progress. Left foot is about the same to slightly improved. ooMajor problem is on the right foot. He has not had an x-ray. Deep tissue culture I did last week showed both Enterobacter and [name]. coli. I did not change the doxycycline I put him on empirically although neither 1 of these were plated to doxycycline. He arrives today with the erythema looking worse on both the dorsal and plantar foot. Macerated skin on the bottom of the foot. he has not been systemically unwell 5/18-Patient returns at 1 week, left calf wound appears to be making some progress, left buttock wound appears slightly worse than last time, left foot wound looks slightly better, right foot redness is marginally better. X-ray of both feet show no air or evidence of osteomyelitis. Patient is finished his Omnicef and terbinafine. He continues to have macerated skin on the bottom of the left foot as well as right 5/26; left calf wound is better, left buttock wound appears to have multiple small superficial open areas with surrounding macerated skin. X-rays that I did last time showed no evidence of osteomyelitis  in either foot. He is finished cefdinir and doxycycline. I do not think that he was on terbinafine. He continues to have [name] large superficial open area on the right foot anterior dorsal and slightly between the first and second toes. I did send him to dermatology 2 months ago or so wondering about whether they would do [name] fungal scraping. I do not believe they did but did do [name] culture. We have been using silver alginate to the toe areas, he has been using antifungals at home topically either ketoconazole or Lotrisone. We are using silver collagen on the left foot, silver alginate on the right, silver collagen on the  left lateral leg and silver alginate on the left buttock 6/1; left buttock area is healed. We have the left dorsal foot, left lateral leg and right dorsal foot. We are using silver alginate to the areas on both feet and silver collagen to the area on his left lateral calf 6/8; the left buttock apparently reopened late last week. He is not really sure how this happened. He is tolerating the terbinafine. Using silver alginate to all wounds 6/15; left buttock wound is larger than last week but still superficial. ooCame in the clinic today with [name] report of purulence from the left lateral leg I did not identify any infection ooBoth areas on his dorsal feet appear to be better. He is tolerating the terbinafine. Using silver alginate to all wounds 6/22; left buttock is about the same this week, left calf quite [name] bit better. His left foot is about the same however he comes in with erythema and warmth in the right forefoot once again. Culture that I gave him in the beginning of May showed Enterobacter and [name]. coli. I gave him doxycycline and things seem to improve although neither 1 of these organisms was specifically plated. 6/29; left buttock is larger and dry this week. Left lateral calf looks to me to be improved. Left dorsal foot also somewhat improved right foot completely unchanged. The erythema on the right foot is still present. He is completing the Ceftin dinner that I gave him empirically [see discussion above.) 7/6 - All wounds look to be stable and perhaps improved, the left buttock wound is slightly smaller, per patient bleeds easily, completed ceftin, the right foot redness is less, he is on terbinafine 7/13; left buttock wound about the same perhaps slightly narrower. Area on the left lateral leg continues to narrow. Left dorsal foot slightly smaller right foot about the same. We are using silver alginate on the right foot and Hydrofera Blue to the areas on the left. Unna boot on the left 2  layer compression on the right 7/20; left buttock wound absolutely the same. Area on lateral leg continues to get better. Left dorsal foot require debridement as did the right no major change in the 7/27; left buttock wound the same size necrotic debris over the surface. The area on the lateral leg is closed once again. His left foot looks better right foot about the same although there is some involvement now of the posterior first second toe area. He is still on terbinafine which I have given him for [name] month, not certain [name] centimeter major change 06/25/19-All wounds appear to be slightly improved according to report, left buttock wound looks clean, both foot wounds have minimal to no debris the right dorsal foot has minimal slough. We are using Hydrofera Blue to the left and silver alginate to the right foot and ischial wound.  8/10-Wounds all appear to be around the same, the right forefoot distal part has some redness which was not there before, however the wound looks clean and small. Ischial wound looks about the same with no changes 8/17; his wound on the left lateral calf which was his original chronic venous insufficiency wound remains closed. Since I last saw him the areas on the left dorsal foot right dorsal foot generally appear better but require debridement. The area on his left initial tuberosity appears somewhat larger to me perhaps hyper granulated and bleeds very easily. We have been using Hydrofera Blue to the left dorsal foot and silver alginate to everything else 8/24; left lateral calf remains closed. The areas on his dorsal feet on the webspace of the first and second toes bilaterally both look better. The area on the left buttock which is the pressure ulcer stage II slightly smaller. I change the dressing to Hydrofera Blue to all areas 8/31; left lateral calf remains closed. The area on his dorsal feet bilaterally look better. Using Hydrofera Blue. Still requiring debridement on  the left foot. No change in the left buttock pressure ulcers however 9/14; left lateral calf remains closed. Dorsal feet look quite [name] bit better than 2 [name] ago. Flaking dry skin also [name] lot better with the ammonium lactate I gave him 2 [name] ago. The area on the left buttock is improved. He states that his Roho cushion developed [name] leak and he is getting [name] new one, in the interim he is offloading this vigorously 9/21; left calf remains closed. Left heel which was [name] possible DTI looks better this week. He had macerated tissue around the left dorsal foot right foot looks satisfactory and improved left buttock wound. I changed his dressings to his feet to silver alginate bilaterally. Continuing Hydrofera Blue on the left buttock. 9/28 left calf remains closed. Left heel did not develop anything [possible DTI] dry flaking skin on the left dorsal foot. Right foot looks satisfactory. Improved left buttock wound. We are using silver alginate on his feet Hydrofera Blue on the buttock. I have asked him to go back to the Lotrisone on his feet including the wounds and surrounding areas 10/5; left calf remains closed. The areas on the left and right feet about the same. [name] lot of this is epithelialized however debris over the remaining open areas. He is using Lotrisone and silver alginate. The area on the left buttock using Hydrofera Blue 10/26. Patient has been out for 3 [name] secondary to Covid concerns. He tested negative but I think his wife tested positive. He comes in today with the left foot substantially worse, right foot about the same. Even more concerning he states that the area on his left buttock closed over but then reopened and is considerably deeper in one aspect than it was before [stage III wound] 11/2; left foot really about the same as last week. Quarter sized wound on the dorsal foot just proximal to the first second toes. Surrounding erythema with areas of denuded epithelium. This is not  really much different looking. Did not look like cellulitis this time however. ooRight foot area about the same.. We have been using silver alginate alginate on his toes ooLeft buttock still substantial irritated skin around the wound which I think looks somewhat better. We have been using Hydrofera Blue here. 11/9; left foot larger than last week and [name] very necrotic surface. Right foot I think is about the same perhaps slightly smaller. Debris around the  circumference also addressed. Unfortunately on the left buttock there is been [name] decline. Satellite lesions below the major wound distally and now [name] an additional one posteriorly we have been using Hydrofera Blue but I think this is [name] pressure issue 11/16; left foot ulcer dorsally again [name] very adherent necrotic surface. Right foot is about the same. Not much change in the pressure ulcer on his left buttock. 11/30; left foot ulcer dorsally basically the same as when I saw him 2 [name] ago. Very adherent fibrinous debris on the wound surface. Patient reports [name] lot of drainage as well. 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 We have been using Iodoflex, patient changed back to alginate because of the drainage. Area on his right dorsal foot really looks benign with [name] healthier surface certainly [name] lot better than on the left. Left buttock wounds all improved using Hydrofera Blue 12/7; left dorsal foot again no improvement. Tightly adherent debris. PCR culture I did last week only showed likely skin contaminant. I have gone ahead and done [name] punch biopsy of this which is about the last thing in terms of investigations I can think to do. He has known venous insufficiency and venous hypertension and this could be the issue here. The area on the right foot is about the same left buttock slightly worse according to our intake nurse secondary to [facility] Blue sticking to the wound 12/14; biopsy of the left  foot that I did last time showed changes that could be related to wound healing/chronic stasis dermatitis phenomenon no neoplasm. We have been using silver alginate to both feet. I change the one on the left today to Sorbact and silver alginate to his other 2 wounds 12/28; the patient arrives with the following problems; ooMajor issue is the dorsal left foot which continues to be [name] larger deeper wound area. Still with [name] completely nonviable surface ooParadoxically the area mirror image on the right on the right dorsal foot appears to be getting better. ooHe had some loss of dry denuded skin from the lower part of his original wound on the left lateral calf. Some of this area looked [name] little vulnerable and for this reason we put him in wrap that on this side this week ooThe area on his left buttock is larger. He still has the erythematous circular area which I think is [name] combination of pressure, sweat. This does not look like cellulitis or fungal dermatitis 11/26/2019; -Dorsal left foot large open wound with depth. Still debris over the surface. Using Sorbact ooThe area on the dorsal right foot paradoxically has closed over [facility] has [name] reopening on the left ankle laterally at the base of his original wound that extended up into the calf. This appears clean. ooThe left buttock wound is smaller but with very adherent necrotic debris over the surface. We have been using silver alginate here as well The patient had arterial studies done in 2017. He had biphasic waveforms at the dorsalis pedis and posterior tibial bilaterally. ABI in the left was 1.17. Digit waveforms were dampened. He has slight spasticity in the great toes I do not think [name] TBI would be possible 1/11; the patient comes in today with [name] sizable reopening between the first and second toes on the right. This is not exactly in the same location where we have been treating wounds previously. According to our intake nurse this was actually  fairly deep but 0.6 cm. The area on the  left dorsal foot looks about the same the surface is somewhat cleaner using Sorbact, his MRI is in 2 days. We have not managed yet to get arterial studies. The new reopening on the left lateral calf looks somewhat better using alginate. The left buttock wound is about the same using alginate 1/18; the patient had his ARTERIAL studies which were quite normal. ABI in the right at 1.13 with triphasic/biphasic waveforms on the left ABI 1.06 again with triphasic/biphasic waveforms. It would not have been possible to have done [name] toe brachial index because of spasticity. We have been using Sorbac to the left foot alginate to the rest of his wounds on the right foot left lateral calf and left buttock 1/25; arrives in clinic with erythema and swelling of the left forefoot worse over the first MTP area. This extends laterally dorsally and but also posteriorly. Still has an area on the left lateral part of the lower part of his calf wound it is eschared and clearly not closed. ooArea on the left buttock still with surrounding irritation and erythema. ooRight foot surface wound dorsally. The area between the right and first and second toes appears better. 2/1; ooThe left foot wound is about the same. Erythema slightly better I gave him [name] week of doxycycline empirically ooRight foot wound is more extensive extending between the toes to the plantar surface ooLeft lateral calf really no open surface on the inferior part of his original wound however the entire area still looks vulnerable ooAbsolutely no improvement in the left buttock wound required debridement. 2/8; the left foot is about the same. Erythema is slightly improved I gave him clindamycin last week. ooRight foot looks better he is using Lotrimin and silver alginate ooHe has [name] breakdown in the left lateral calf. Denuded epithelium which I have removed ooLeft buttock about the same were using Hydrofera  Blue 2/15; left foot is about the same there is less surrounding erythema. Surface still has tightly adherent debris which I have debriding however not making any progress ooRight foot has [name] substantial wound on the medial right second toe between the first and second webspace. ooStill an open area on the left lateral calf distal area. ooButtock wound is about the same 2/22; left foot is about the same less surrounding erythema. Surface has adherent debris. Polymen Ag Right foot area significant wound between the first and second toes. We have been using silver alginate here Left lateral leg polymen Ag at the base of his original venous insufficiency wound ooLeft buttock some improvement here 3/1; ooRight foot is deteriorating in the first second toe webspace. Larger and more substantial. We have been using silver alginate. ooLeft dorsal foot about the same markedly adherent surface debris using PolyMem Ag ooLeft lateral calf surface debris using PolyMem AG ooLeft buttock is improved again using PolyMem Ag. ooHe is completing his terbinafine. The erythema in the foot seems better. He has been on this for 2 [name] 3/8; no improvement in any wound area in fact he has [name] small open area on the dorsal midfoot which is new this week. He has not gotten his foot x-rays yet 3/15; his x-rays were both negative for osteomyelitis of both feet. No major change in any of his wounds on the extremities however his buttock wounds are better. We have been using polymen on the buttocks, left lower leg. Iodoflex on the left foot and silver alginate on the right 3/22; arrives in clinic today with the 2 major issues are the  improvement in the left dorsal foot wound which for once actually looks healthy with [name] nice healthy wound surface without debridement. Using Iodoflex here. Unfortunately on the left lateral calf which is in the distal part of his original wound he came to the clinic here for there was  purulent drainage noted some increased breakdown scattered around the original area and [name] small area proximally. We we are using polymen here will change to silver alginate today. His buttock wound on the left is better and I think the area on the right first second toe webspace is also improved 3/29; left dorsal foot looks better. Using Iodoflex. Left ankle culture from deterioration last time grew [name]. coli, Enterobacter and Enterococcus. I will give him [name] course of cefdinir although that will not cover Enterococcus. The area on the right foot in the webspace of the first and second toe lateral first toe looks better. The area on his buttock is about healed Vascular appointment is on April 21. This is to look at his venous system vis--vis continued breakdown of the wounds on the left including the left lateral leg and left dorsal foot he. He has had previous ablations on this side 4/5; the area between the right first and second toes lateral aspect of the first toe looks better. Dorsal aspect of the left first toe on the left foot also improved. Unfortunately the left lateral lower leg is larger and there is [name] second satellite wound superiorly. The usual superficial abrasions on the left buttock overall better but certainly not closed 4/12; the area between the right first and second toes is improved. Dorsal aspect of the left foot also slightly smaller with [name] vibrant healthy looking surface. No real change in the left lateral leg and the left buttock wound is healed He has an unaffordable co-pay for Apligraf. Appointment with vein and vascular with regards to the left leg venous part of the circulation is on 4/21 4/19; we continue to see improvement in all wound areas. Although this is minor. He has his vascular appointment on 4/21. The area on the left buttock has not reopened although right in the center of this area the skin looks somewhat threatened 4/26; the left buttock is unfortunately  reopened. In general his left dorsal foot has [name] healthy surface and looks somewhat smaller although it was not measured as such. The area between his first and second toe webspace on the right as [name] small wound against the first toe. The patient saw vascular surgery. The real question I was asking was about the small saphenous vein on the left. He has previously ablated left greater saphenous vein. Nothing further was commented on on the left. Right greater saphenous vein without reflux at the saphenofemoral junction or proximal thigh there was no indication for ablation of the right greater saphenous vein duplex was negative for DVT bilaterally. They did not think there was anything from [name] point of view that could be offered. They ABIs within normal limits 5/3; only small open area on the left buttock. The area on the left lateral leg which was his original venous reflux is now 2 wounds both which look clean. We are using Iodoflex on the left dorsal foot which looks healthy and smaller. He is down to [name] very tiny area between the right first and second toes, using silver alginate 5/10; all of his wounds appear better. We have much better edema control in 4 layer compression on the left. This may  be the factor that is allowing the left foot and left lateral calf to heal. He has external compression garments at home 04/14/20-All of his wounds are progressing well, the left forefoot is practically closed, left ischium appears to be about the same, right toe webspace is also smaller. The left lateral leg is about the same, continue using Hydrofera Blue to this, silver alginate to the ischium, Iodoflex to the toe space on the right 6/7; most of his wounds outside of the left buttock are doing well. The area on the left lateral calf and left dorsal foot are smaller. The area on the right foot in between the first and second toe webspace is barely visible although he still says there is some  drainage here is the only reason I did not heal this out. ooUnfortunately the area on the left buttock almost looks like he has [name] skin tear from tape. He has open wound and then [name] large flap of skin that we are trying to get adherence over an area just next to the remaining wound 6/21; 2 week follow-up. I believe is been here for nurse visits. Miraculously the area between his first and second toes on the left dorsal foot is closed over. Still open on the right first second web space. The left lateral calf has 2 open areas. Distally this is more superficial. The proximal area had [name] little more depth and required debridement of adherent necrotic material. His buttock wound is actually larger we have been using silver alginate here 6/28; the patient's area on the left foot remains closed. Still open wet area between the first and second toes on the right and also extending into the plantar aspect. We have been using silver alginate in this location. He has 2 areas on the left lower leg part of his original long wounds which I think are better. We have been using Hydrofera Blue here. Hydrofera Blue to the left buttock which is stable 7/12; left foot remains closed. Left ankle is closed. May be [name] small area between his right first and second toes the only truly open area is on the left buttock. We have been using Hydrofera Blue here 7/19; patient arrives with marked deterioration especially in the left foot and ankle. We did not put him in [name] compression wrap on the left last week in fact he wore his juxta lite stockings on either side although he does not have an underlying stocking. He has [name] reopening on the left dorsal foot, left lateral ankle and [name] new area on the right dorsal ankle. More worrisome is the degree of erythema on the left foot extending on the lateral foot into the lateral lower leg on the left 7/26; the patient had erythema and drainage from the lateral left ankle last week. Culture of  this grew MRSA resistant to doxycycline and clindamycin which are the 2 antibiotics we usually use with this patient who has multiple antibiotic allergies including linezolid, trimethoprim sulfamethoxazole. I had give him an empiric doxycycline and he comes in the area certainly looks somewhat better although it is blotchy in his lower leg. He has not been systemically unwell. He has had areas on the left dorsal foot which is [name] reopening, chronic wounds on the left lateral ankle. Both of these I think are secondary to chronic venous insufficiency. The area between his first and second toes is closed as far as I can tell. He had [name] new wrap injury on the right dorsal ankle  last week. Finally he has an area on the left buttock. We have been using silver alginate to everything except the left buttock we are using Hydrofera Blue 06/30/20-Patient returns at 1 week, has been given [name] sample dose pack of NUZYRA which is [name] tetracycline derivative [omadacycline], patient has completed those, we have been using silver alginate to almost all the wounds except the left ischium where we are using Hydrofera Blue all of them look better 8/16; since I last saw the patient he has been doing well. The area on the left buttock, left lateral ankle and left foot are all closed today. He has completed the [place] I gave him last time and tolerated this well. He still has open areas on the right dorsal ankle and in the right first second toe area which we are using silver alginate. 8/23; we put him in his bilateral external compression stockings last week as he did not have anything open on either leg except for concerning area between the right first and second toe. He comes in today with an area on the left dorsal foot slightly more proximal than the original wound, the left lateral foot but this is actually [name] continuation of the area he had on the left lateral ankle from last time. As well he is opened up on the left buttock  again. 8/30; comes in today with things looking [name] lot better. The area on the left lower ankle has closed down as has the left foot but with eschar in both areas. The area on the dorsal right ankle is also epithelialized. Very little remaining of the left buttock wound. We have been using silver alginate on all wound areas 9/13; the area in the first second toe webspace on the right has fully epithelialized. He still has some vulnerable epithelium on the right and the ankle and the dorsal foot. He notes weeping. He is using his juxta lite stocking. On the left again the left dorsal foot is closed left lateral ankle is closed. We went to the juxta lite stocking here as well. ooStill vulnerable in the left buttock although only 2 small open areas remain here 9/27; 2-week follow-up. We did not look at his left leg but the patient says everything is closed. He is [name] bit disturbed by the amount of edema in his left foot he is using juxta lite stockings but asking about over the toes stockings which would be 30/40, will talk to him next time. According to him there is no open wound on either the left foot or the left ankle/calf He has an open area on the dorsal right calf which I initially point [name] wrap injury. He has superficial remaining wound on the left ischial tuberosity been using silver alginate although he says this sticks to the wound 10/5; we gave him 2-week follow-up but he called yesterday expressing some concerns about his right foot right ankle and the left buttock. He came in early. There is still no open areas on the left leg and that still in his juxta lite stocking 10/11; he only has 1 small area on the left buttock that remains measuring millimeters 1 mm. Still has the same irritated skin in this area. We recommended zinc oxide when this eventually closes and pressure relief is meticulously is he can do this. He still has an area on the dorsal part of his right first through third toes  which is [name] bit irritated and still open and on the dorsal ankle  near the crease of the ankle. We have been using silver alginate and using his own stocking. He has nothing open on the left leg or foot 10/25; 2-week follow-up. Not nearly as good on the left buttock as I was hoping. For open areas with 5 looking threatened small. He has the erythematous irritated chronic skin in this area. oo1 area on the right dorsal ankle. He reports this area bleeds easily ooRight dorsal foot just proximal to the base of his toes ooWe have been using silver alginate. 11/8; 2-week follow-up. Left buttock is about the same although I do not think the wounds are in the same location we have been using silver alginate. I have asked him to use zinc oxide on the skin around the wounds. ooHe still has [name] small area on the right dorsal ankle he reports this bleeds easily ooRight dorsal foot just proximal to the base of the toes does not have anything open although the skin is very dry and scaly ooHe has [name] new opening on the nailbed of the left great toe. Nothing on the left ankle 11/29; 3-week follow-up. Left buttock has 2 open areas. And washing of these wounds today started bleeding easily. Suggesting very friable tissue. We have been using silver alginate. Right dorsal ankle which I thought was initially [name] wrap injury we have been using silver alginate. Nothing open between the toes that I can see. He states the area on the left dorsal toe nailbed healed after the last visit in 2 or 3 days 12/13; 3-week follow-up. His left buttock now has 3 open areas but the original 2 areas are smaller using polymen here. Surrounding skin looks better. The right dorsal ankle is closed. He has [name] small opening on the right dorsal foot at the level of the third toe. In general the skin looks better here. He is wearing his juxta lite stocking on the left leg says there is nothing open Objective Constitutional Sitting or standing  Blood Pressure is within target range for patient.. Pulse regular and within target range for patient.Marland Kitchen Respirations regular, non-labored and within target range.. Temperature is normal and within the target range for the patient.Marland Kitchen Appears in no distress. Vitals Time Taken: 7:51 AM, Height: 70 in, Weight: 216 lbs, BMI: 31, Temperature: 98.4 F, Pulse: 111 bpm, Respiratory Rate: 18 breaths/min, Blood Pressure: 116/73 mmHg. General Notes: Wound exam; ooLeft buttock has 3 open areas. The original 2 areas are about the size of [name] dime. Tissue looks healthy no debridement is required if there is [name] smaller area distally. Surrounding irritated skin looks better ooRight foot small open area at the level of the third toe this is new this week. There is nothing between his toes his skin looks better. ooThe area on the right ankle is closed Integumentary (Hair, Skin) Wound #38R status is Open. Original cause of wound was Gradually Appeared. The wound is located on the Right T - Web between 1st and 2nd. The wound oe measures 0.3cm length x 0.5cm width x 0.3cm depth; 0.118cm^2 area and 0.035cm^3 volume. There is Fat Layer (Subcutaneous Tissue) exposed. There is no tunneling or undermining noted. There is [name] small amount of serous drainage noted. The wound margin is flat and intact. There is medium (34-66%) pink granulation within the wound bed. There is [name] medium (34-66%) amount of necrotic tissue within the wound bed including Adherent Slough. Wound #41R status is Open. Original cause of wound was Gradually Appeared. The wound is located on the  Left Ischium. The wound measures 4.5cm length x 1.6cm width x 0.2cm depth; 5.655cm^2 area and 1.131cm^3 volume. There is Fat Layer (Subcutaneous Tissue) exposed. There is no tunneling or undermining noted. There is [name] medium amount of serosanguineous drainage noted. The wound margin is distinct with the outline attached to the wound base. There is large (67-100%) red,  friable granulation within the wound bed. There is [name] small (1-33%) amount of necrotic tissue within the wound bed including Adherent Slough. Wound #46 status is Open. Original cause of wound was Gradually Appeared. The wound is located on the Right,Dorsal Ankle. The wound measures 0cm length x 0cm width x 0cm depth; 0cm^2 area and 0cm^3 volume. Wound #47 status is Open. Original cause of wound was Gradually Appeared. The wound is located on the Right,Dorsal Foot. The wound measures 1.1cm length x 3.5cm width x 0.1cm depth; 3.024cm^2 area and 0.302cm^3 volume. There is no tunneling or undermining noted. There is [name] medium amount of serosanguineous drainage noted. The wound margin is distinct with the outline attached to the wound base. There is medium (34-66%) red, pink granulation within the wound bed. There is [name] medium (34-66%) amount of necrotic tissue within the wound bed including Adherent Slough. 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 Bathing/ Shower/ Hygiene: May shower and wash wound with soap and water. - on days that dressing is changed Edema Control - Lymphedema / SCD / Other: Elevate legs to the level of the heart or above for 30 minutes daily and/or when sitting, [name] frequency of: - throughout the day Compression stocking or Garment 30-40 mm/Hg pressure to: - Juxtalite to both legs daily Off-Loading: Roho cushion for wheelchair Turn and reposition every 2 hours WOUND #38R:  - T - Web between 1st and 2nd Wound Laterality: Right oe Cleanser: Soap and Water Every Other Day/30 Days Discharge Instructions: May shower and wash wound with dial antibacterial soap and water prior to dressing change. Peri-Wound Care: Sween Lotion (Moisturizing lotion) Every Other Day/30 Days Discharge Instructions: Apply moisturizing lotion as directed Prim Dressing: KerraCel Ag Gelling Fiber Dressing, 2x2 in (silver alginate) Every Other Day/30 Days ary Discharge Instructions: Apply silver alginate to wound bed as instructed Secondary Dressing: Woven Gauze Sponge, Non-Sterile 4x4 in Every Other Day/30 Days Discharge Instructions: Apply over primary dressing as directed. Secured With: [employer], 4.5x3.1 (in/yd) Every Other Day/30 Days Discharge Instructions: Secure with Kerlix as directed. WOUND #41R: - Ischium Wound Laterality: Left Cleanser: Soap and Water Every Other Day/30 Days Discharge Instructions: May shower and wash wound with dial antibacterial soap and water prior to dressing change. Prim Dressing: PolyMem Non-Adhesive Dressing, 4x4 in Every Other Day/30 Days ary Discharge Instructions: Apply to wound bed as instructed Secondary Dressing: ComfortFoam Border, 4x4 in (silicone border) Every Other Day/30 Days Discharge Instructions: Apply over primary dressing as directed. WOUND #47: - Foot Wound Laterality: Dorsal, Right Prim Dressing: KerraCel Ag Gelling Fiber Dressing, 2x2 in (silver alginate) Every Other Day/30 Days ary Discharge Instructions: Apply silver alginate to wound bed as instructed Secondary Dressing: ComfortFoam Border,  4x4 in (silicone border) Every Other Day/30 Days Discharge Instructions: Apply over primary dressing as directed. #1 I have asked him to stop using the Lotrisone on the dorsal foot. I do not think he needs the topical Lotrimin either. 2. Silver alginate here with the skin protection that he is using from [employer] 3. Still  polymen on his buttock. 4. Using his juxta light on both legs Electronic Signature(s) Signed: 11/06/2020 8:09:40 AM By: Linton Ham MD Entered By: Linton Ham on 11/03/2020 08:16:27 -------------------------------------------------------------------------------- SuperBill Details Patient Name: Date of Service: [name] [name], [name] [name] 11/03/2020 Medical Record Number: [id number] Patient Account Number: [id number] Date of Birth/Sex: Treating RN: [birth year] (34 y.o. [name] [name] Primary Care Provider: Rhodes, [place] Other Clinician: Referring Provider: Treating Provider/Extender: [name] [name] in Treatment: 252 Diagnosis Coding ICD-10 Codes Code Description I87.332 Chronic venous hypertension (idiopathic) with ulcer and inflammation of left lower extremity L97.321 Non-pressure chronic ulcer of left ankle limited to breakdown of skin L97.311 Non-pressure chronic ulcer of right ankle limited to breakdown of skin L97.511 Non-pressure chronic ulcer of other part of right foot limited to breakdown of skin L97.521 Non-pressure chronic ulcer of other part of left foot limited to breakdown of 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, 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, complete Facility Procedures CPT4 Code: 88648472 Description: 99214 - WOUND CARE VISIT-LEV 4 EST PT Modifier: Quantity: 1 Physician Procedures : CPT4 Code Description Modifier [id number] - WC PHYS LEVEL 3 - EST PT ICD-10 Diagnosis Description L97.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, stage 3 Quantity: 1 Electronic Signature(s) Signed: 11/04/2020 5:24:10 PM By: Levan Hurst RN, BSN Signed: 11/06/2020 8:09:40 AM By: Linton Ham MD Entered By: Levan Hurst on 11/03/2020 08:37:42

## 2020-11-24 ENCOUNTER — Other Ambulatory Visit: Payer: Self-pay

## 2020-11-24 ENCOUNTER — Encounter (HOSPITAL_BASED_OUTPATIENT_CLINIC_OR_DEPARTMENT_OTHER): Payer: 59 | Attending: Internal Medicine | Admitting: Internal Medicine

## 2020-11-24 DIAGNOSIS — L97311 Non-pressure chronic ulcer of right ankle limited to breakdown of skin: Secondary | ICD-10-CM | POA: Diagnosis not present

## 2020-11-24 DIAGNOSIS — Z87891 Personal history of nicotine dependence: Secondary | ICD-10-CM | POA: Insufficient documentation

## 2020-11-24 DIAGNOSIS — Z89422 Acquired absence of other left toe(s): Secondary | ICD-10-CM | POA: Diagnosis not present

## 2020-11-24 DIAGNOSIS — G8220 Paraplegia, unspecified: Secondary | ICD-10-CM | POA: Diagnosis not present

## 2020-11-24 DIAGNOSIS — L97321 Non-pressure chronic ulcer of left ankle limited to breakdown of skin: Secondary | ICD-10-CM | POA: Insufficient documentation

## 2020-11-24 DIAGNOSIS — L97511 Non-pressure chronic ulcer of other part of right foot limited to breakdown of skin: Secondary | ICD-10-CM | POA: Diagnosis not present

## 2020-11-24 DIAGNOSIS — I87332 Chronic venous hypertension (idiopathic) with ulcer and inflammation of left lower extremity: Secondary | ICD-10-CM | POA: Insufficient documentation

## 2020-11-24 DIAGNOSIS — L97521 Non-pressure chronic ulcer of other part of left foot limited to breakdown of skin: Secondary | ICD-10-CM | POA: Diagnosis not present

## 2020-11-24 DIAGNOSIS — L89323 Pressure ulcer of left buttock, stage 3: Secondary | ICD-10-CM | POA: Insufficient documentation

## 2020-11-24 DIAGNOSIS — G8221 Paraplegia, complete: Secondary | ICD-10-CM | POA: Insufficient documentation

## 2020-11-25 NOTE — Progress Notes (Signed)
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 him aggressively for this although we have not made any improvement here. 08/11/17; left lateral leg. Surface continues to improve with the Iodoflex although we are not seeing much improvement in overall wound dimensions. Areas on his plantar left foot and right foot show no improvement. In fact the right foot looks somewhat worse 08/18/17; left lateral leg. We changed to [facility] Blue last week after a prolonged course of Iodoflex which helps get the surface better. It appears that the wound with is improved. Continue with difficult areas on the left dorsal first second and plantar first second on the right  09/01/17; patient arrives in clinic today having had a temperature of 103 yesterday. He was seen in the ER and [facility]. The patient was concerned he could have cellulitis again in the right leg however they diagnosed him with a UTI and he is now on Keflex. He has a history of cellulitis which is been recurrent and difficult but this is been in the left leg, in the past 5 use doxycycline. He does in and out catheterizations at home which are risk factors for UTI 09/08/17; patient will be completing his Keflex this weekend. The erythema on the left leg is considerably better. He has a new  wound today on the medial part of the right leg small superficial almost looks like a skin tear. He has worsening of the area on the right dorsal first and second toe. His major area on the left lateral leg is better. Using Hydrofera Blue on all areas 09/15/17; gradual reduction in width on the long wound in the left lateral leg. No debridement required. He also has wounds on the plantar aspect of his left first second toe web space and on the dorsal aspect of the right first second toe web space. 09/22/17; there continues to be very gradual improvements in the dimensions of the left lateral leg wound. He hasn't round erythematous spot with might be pressure on his wheelchair. There is no evidence obviously of infection no purulence no warmth He has a dry scaled area on the plantar aspect of the left first second toe Improved area on the dorsal right first second toe. 09/29/17; left lateral leg wound continues to improve in dimensions mostly with an is still a fairly long but increasingly narrow wound. He has a dry scaled area on the plantar aspect of his left first second toe web space Increasingly concerning area on the dorsal right first second toe. In fact I am concerned today about possible cellulitis around this wound. The areas extending up his second toe and although there is deformities here almost appears to abut on the nailbed. 10/06/17; left lateral leg wound continues to make very gradual progress. Tissue culture I did from the right first second toe dorsal foot last time grew MRSA and enterococcus which was vancomycin sensitive. This was not sensitive to clindamycin or doxycycline. He is allergic to Zyvox and sulfa we have therefore arrange for him to have dalvance infusion tomorrow. He is had this in the past and tolerated it well 10/20/17; left lateral leg wound continues to make decent progress. This is certainly reduced in terms of with there is advancing epithelialization.The  cellulitis in the right foot looks better although he still has a deep wound in the dorsal aspect of the first second toe web space. Plantar left first toe web space on the left I think is making some progress 10/27/17; left lateral leg wound continues to make decent progress. Advancing epithelialization.using Hydrofera Blue The right first second toe web space wound is better-looking using silver alginate Improvement in the left plantar first second toe web space. Again using silver alginate 11/03/17 left lateral leg wound continues to make decent progress albeit slowly. Using [facility] The right per second toe web space continues to be a very problematic looking punched out wound. I obtained a piece of tissue for deep culture I did extensively treated this for fungus. It is difficult to imagine that this is a pressure area as the patient states other than going outside he doesn't really wear shoes at home The  left plantar first second toe web space looked fairly senescent. Necrotic edges. This required debridement change to [facility] Blue to all wound areas 11/10/17; left lateral leg wound continues to contract. Using Hydrofera Blue On the right dorsal first second toe web space dorsally. Culture I did of this area last week grew MRSA there is not an easy oral option in this patient was multiple antibiotic allergies or intolerances. This was only a rare culture isolate I'm therefore going to use Bactroban under silver alginate On the left plantar first second toe web space. Debridement is required here. This is also unchanged 11/17/17; left lateral leg wound continues to contract using Hydrofera Blue this is no longer the major issue. The major concern here is the right first second toe web space. He now has an open area going from dorsally to the plantar aspect. There is now wound on the inner lateral part of the first toe. Not a very viable surface on this. There is erythema spreading  medially into the forefoot. No major change in the left first second toe plantar wound 11/24/17; left lateral leg wound continues to contract using Hydrofera Blue. Nice improvement today The right first second toe web space all of this looks a lot less angry than last week. I have given him clindamycin and topical Bactroban for MRSA and terbinafine for the possibility of underlining tinea pedis that I could not control with ketoconazole. Looks somewhat better The area on the plantar left first second toe web space is weeping with dried debris around the wound 12/01/17; left lateral leg wound continues to contract he Hydrofera Blue. It is becoming thinner in terms of with nevertheless it is making good improvement. The right first second toe web space looks less angry but still a large necrotic-looking wounds starting on the plantar aspect of the right foot extending between the toes and now extensively on the base of the right second toe. I gave him clindamycin and topical Bactroban for MRSA anterior benefiting for the possibility of underlying tinea pedis. Not looking better today The area on the left first/second toe looks better. Debrided of necrotic debris 12/05/17* the patient was worked in urgently today because over the weekend he found blood on his incontinence bad when he woke up. He was found to have an ulcer by his wife who does most of his wound care. He came in today for [place] to look at this. He has not had a history of wounds in his buttocks in spite of his paraplegia. 12/08/17; seen in follow-up today at his usual appointment. He was seen earlier this week and found to have a new wound on his buttock. We also follow him for wounds on the left lateral leg, left first second toe web space and right first second toe web space 12/15/17; we have been using Hydrofera Blue to the left lateral leg which has improved. The right first second toe web space has also improved. Left first second toe web  space plantar aspect looks stable. The left buttock has worsened using Santyl. Apparently the buttock has drainage 12/22/17; we have been using Hydrofera Blue to the left lateral leg which continues to improve now 2 small wounds separated by normal skin. He tells [place] he had a fever up to 100 yesterday he is prone to UTIs but has not noted anything different. He does in and out catheterizations. The area between the first and second toes today does not look good necrotic surface covered with what looks  to be purulent drainage and erythema extending into the third toe. I had gotten this to something that I thought look better last time however it is not look good today. He also has a necrotic surface over the buttock wound which is expanded. I thought there might be infection under here so I removed a lot of the surface with a #5 curet though nothing look like it really needed culturing. He is been using Santyl to this area 12/27/17; his original wound on the left lateral leg continues to improve using Hydrofera Blue. I gave him samples of Baxdella although he was unable to take them out of fear for an allergic reaction ["lump in his throat"].the culture I did of the purulent drainage from his second toe last week showed both enterococcus and a set Enterobacter I was also concerned about the erythema on the bottom of his foot although paradoxically although this looks somewhat better today. 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 We've been using silver alginate here. They came up today that he sleeps in a recliner, I'm not sure why but I asked him to stop this 01/03/18; his original wound we've been using Hydrofera Blue is now separated into 2 areas. Ulcer on his left buttock is better he is off the recliner and sleeping in bed Finally both wound areas between his first and second toes also looks some  better 01/10/18; his original wound on the left lateral leg is now separated into 2 wounds we've been using Hydrofera Blue Ulcer on his left buttock has some drainage. There is a small probing site going into muscle layer superiorly.using silver alginate -He arrives today with a deep tissue injury on the left heel The wound on the dorsal aspect of his first second toe on the left looks a lot betterusing silver alginate ketoconazole The area on the first second toe web space on the right also looks a lot bette 01/17/18; his original wound on the left lateral leg continues to progress using Hydrofera Blue Ulcer on his left buttock also is smaller surface healthier except for a small probing site going into the muscle layer superiorly. 2.4 cm of tunneling in this area DTI on his left heel we have only been offloading. Looks better than last week no threatened open no evidence of infection the wound on the dorsal aspect of the first second toe on the left continues to look like it's regressing we have only been using silver alginate and terbinafine orally The area in the first second toe web space on the right also looks to be a lot better using silver alginate and terbinafine I think this was prompted by tinea pedis 01/31/18; the patient was hospitalized in [place] last week apparently for a complicated UTI. He was discharged on cefepime he does in and out catheterizations. In the hospital he was discovered M I don't mild elevation of AST and ALT and the terbinafine was stopped.predictably the pressure ulcer on s his buttock looks betterusing silver alginate. The area on the left lateral leg also is better using Hydrofera Blue. The area between the first and second toes on the left better. First and second toes on the right still substantial but better. Finally the DTI on the left heel has held together and looks like it's resolving 02/07/18-he is here in follow-up evaluation for multiple ulcerations.  He has new injury to the lateral aspect of  the last issue a pressure ulcer, he states this is from adhesive removal trauma. He states he has tried multiple adhesive products with no success. All other ulcers appear stable. The left heel DTI is resolving. We will continue with same treatment plan and follow-up next week. 02/14/18; follow-up for multiple areas. He has a new area last week on the lateral aspect of his pressure ulcer more over the posterior trochanter. The original pressure ulcer looks quite stable has healthy granulation. 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 Almost fully epithelialized on the original superior area using Hydrofera Blue DTI on the left heel has peeled off this week to reveal a small superficial wound under denuded skin and subcutaneous tissue Both areas between the first and second toes look better including nothing open on the left 02/21/18; The patient's wounds on his left ischial tuberosity and posterior left greater trochanter actually looked better. He has a large area of irritation around the area which I think is contact dermatitis. I am doubtful that this is fungal His original wound on the left lateral calf continues to improve we have been using Hydrofera Blue There is no open area in the left first second toe web space although there is a lot of thick callus The DTI on the left heel required debridement today of necrotic surface eschar and subcutaneous tissue using silver alginate Finally the area on the right first second toe webspace continues to contract using silver alginate and ketoconazole 02/28/18 Left ischial tuberosity wounds look better using silver alginate. Original wound on the left calf only has one small open area left using Hydrofera Blue DTI on the left heel required debridement mostly removing skin from around this wound surface. Using  silver alginate The areas on the right first/second toe web space using silver alginate and ketoconazole 03/08/18 on evaluation today patient appears to be doing decently well as best I can tell in regard to his wounds. This is the first time that I have seen him as he generally is followed by Dr. Dellia Nims. With that being said none of his wounds appear to be infected he does have an area where there is some skin covering what appears to be a new wound on the left dorsal surface of his great toe. This is right at the nail bed. With that being said I do believe that debrided away some of the excess skin can be of benefit in this regard. Otherwise he has been tolerating the dressing changes without complication. 03/14/18; patient arrives today with the multiplicity of wounds that we are following. He has not been systemically unwell Original wound on the left lateral calf now only has 2 small open areas we've been using Hydrofera Blue which should continue The deep tissue injury on the left heel requires debridement today. We've been using silver alginate The left first second toe and the right first second toe are both are reminiscence what I think was tinea pedis. Apparently some of the callus Surface between the toes was removed last week when it started draining. Purulent drainage coming from the wound on the ischial tuberosity on the left. 03/21/18-He is here in follow-up evaluation for multiple wounds. 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 He tolerated debridement. The only change to last week's recommendations is to discontinue antifungal cream between toes. He will follow-up next week  03/28/18; following up for multiple wounds;Concern this week is streaking redness and swelling in the right foot. He is going to need antibiotics for this. 03/31/18; follow-up for right foot cellulitis. Streaking redness and swelling in the  right foot on 03/28/18. 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 I put him on clindamycin 300 mg every 6 and brought him in for a quick check. He has an open wound between his first and second toes on the right foot as a potential source. 04/04/18; Right foot cellulitis is resolving he is completing clindamycin. This is truly good news Left lateral calf wound which is initial wound only has one small open area inferiorly this is close to healing out. He has compression stockings. We will use Hydrofera Blue right down to the epithelialization of this Nonviable surface on the left heel which was initially pressure with a DTI. We've been using Hydrofera Blue. I'm going to switch this back to silver alginate Left first second toe/tinea pedis this looks better using silver alginate Right first second toe tinea pedis using silver alginate Large pressure ulcers on theLeft ischial tuberosity. Small wound here Looks better. I am uncertain about the surface over the large wound. Using silver alginate 04/11/18; Cellulitis in the right foot is resolved Left lateral calf wound which was his original wounds still has 2 tiny open areas remaining this is just about closed Nonviable surface on the left heel is better but still requires debridement Left first second toe/tinea pedis still open using silver alginate Right first second toe wound tinea pedis I asked him to go back to using ketoconazole and silver alginate Large pressure ulcers on the left ischial tuberosity this shear injury here is resolved. Wound is smaller. No evidence of infection using silver alginate 04/18/18; Patient arrives with an intense area of cellulitis in the right mid lower calf extending into the right heel area. Bright red and warm. Smaller area on the left anterior leg. He has a significant history of MRSA. He will definitely need antibioticsdoxycycline He now has 2 open areas on the left ischial tuberosity the  original large wound and now a satellite area which I think was above his initial satellite areas. Not a wonderful surface on this satellite area surrounding erythema which looks like pressure related. His left lateral calf wound again his original wound is just about closed Left heel pressure injury still requiring debridement Left first second toe looks a lot better using silver alginate Right first second toe also using silver alginate and ketoconazole cream also looks better 04/20/18; the patient was worked in early today out of concerns with his cellulitis on the right leg. I had started him on doxycycline. This was 2 days ago. His wife was concerned about the swelling in the area. Also concerned about the left buttock. He has not been systemically unwell no fever chills. No nausea vomiting or diarrhea 04/25/18; the patient's left buttock wound is continued to deteriorate he is using Hydrofera Blue. He is still completing clindamycin for the cellulitis on the right leg although all of this looks better. 05/02/18 Left buttock wound still with a lot of drainage and a very tightly adherent fibrinous necrotic surface. He has a deeper area superiorly The left lateral calf wound is still closed DTI wound on the left heel necrotic surface especially the circumference using Iodoflex Areas between his left first second toe and right first second toe both look better. Dorsally and the right first second toe he  had a necrotic surface although at smaller. In using silver alginate and ketoconazole. I did a culture last week which was a deep tissue culture of the reminiscence of the open wound on the right first second toe dorsally. This grew a few Acinetobacter and a few methicillin-resistant staph aureus. Nevertheless the area actually this week looked better. I didn't feel the need to specifically address this at least in terms of systemic antibiotics. 05/09/18; wounds are measuring larger more drainage per  our intake. We are using Santyl covered with alginate on the large superficial buttock wounds, Iodosorb on the left heel, ketoconazole and silver alginate to the dorsal first and second toes bilaterally. 05/16/18; The area on his left buttock better in some aspects although the area superiorly over the ischial tuberosity required an extensive debridement.using Santyl Left heel appears stable. Using Iodoflex The areas between his first and second toes are not bad however there is spreading erythema up the dorsal aspect of his left foot this looks like cellulitis again. He is insensate the erythema is really very brilliant.o Erysipelas He went to see an allergist days ago because he was itching part of this he had lab work done. This showed a white count of 15.1 with 70% neutrophils. Hemoglobin of 11.4 and a platelet count of 659,000. Last white count we had in Epic was a 2-1/2 years ago which was 25.9 but he was ill at the time. He was able to show me some lab work that was done by his primary physician the pattern is about the same. 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 But prompted me to go ahead and do x-rays of both feet and the pelvis rule out osteomyelitis. He also had a comprehensive metabolic panel this was reasonably normal his albumin was 3.7 liver function tests BUN/creatinine all normal 05/23/18; x-rays of both his feet from last week were negative for underlying pulmonary abnormality. The x-ray of his pelvis however showed mild irregularity in the left ischial which may represent some early osteomyelitis. The wound in the left ischial continues to get deeper clearly now exposed muscle. Each week necrotic surface material over this area. Whereas the rest of the wounds do not look so bad. The left ischial wound we have been using Santyl and calcium alginate T the left heel surface necrotic debris using Iodoflex o The left lateral leg is still  healed Areas on the left dorsal foot and the right dorsal foot are about the same. There is some inflammation on the left which might represent contact dermatitis, fungal dermatitis I am doubtful cellulitis although this looks better than last week 05/30/18; CT scan done at Hospital did not show any osteomyelitis or abscess. Suggested the possibility of underlying cellulitis although I don't see a lot of evidence of this at the bedside The wound itself on the left buttock/upper thigh actually looks somewhat better. No debridement Left heel also looks better no debridement continue Iodoflex Both dorsal first second toe spaces appear better using Lotrisone. Left still required debridement 06/06/18; Intake reported some purulent looking drainage from the left gluteal wound. Using Santyl and calcium alginate Left heel looks better although still a nonviable surface requiring debridement The left dorsal foot first/second webspace actually expanding and somewhat deeper. I may consider doing a shave biopsy of this area Right dorsal foot first/second webspace appears stable to improved. Using Lotrisone and silver alginate to both these areas 06/13/18 Left gluteal surface looks  better. Now separated in the 2 wounds. No debridement required. Still drainage. We'll continue silver alginate Left heel continues to look better with Iodoflex continue this for at least another week Of his dorsal foot wounds the area on the left still has some depth although it looks better than last week. We've been using Lotrisone and silver alginate 06/20/18 Left gluteal continues to look better healthy tissue Left heel continues to look better healthy granulation wound is smaller. He is using Iodoflex and his long as this continues continue the Iodoflex Dorsal right foot looks better unfortunately dorsal left foot does not. There is swelling and erythema of his forefoot. He had minor trauma to this several days ago but doesn't  think this was enough to have caused any tissue injury. Foot looks like cellulitis, we have had this problem before 06/27/18 on evaluation today patient appears to be doing a little worse in regard to his foot ulcer. 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 Both of which would really be his only options for treating this infection. In the past he has been given and effusion of Orbactiv. This is done very well for him in the past again it's one time dosing IV antibiotic therapy. Subsequently I do believe this is something we're gonna need to see about doing at this point in time. Currently his other wounds seem to be doing somewhat better in my pinion I'm pretty happy in that regard. 07/03/18 on evaluation today patient's wounds actually appear to be doing fairly well. He has been tolerating the dressing changes without complication. All in all he seems to be showing signs of improvement. In regard to the antibiotics he has been dealing with infectious disease since I saw him last week as far as getting this scheduled. In the end [facility]'s [facility] this coming Friday. In the meantime he has been continuing to perform the dressing changes in such as previous. There does not appear to be any evidence of infection worsengin at this time. 07/10/18; Since I last saw this man 2 weeks ago things have actually improved. IV antibiotics of resulted in less forefoot erythema although there is still some present. He is not systemically unwell Left buttock wounds 2 now have no depth there is increased epithelialization Using silver alginate Left heel still requires debridement using Iodoflex Left dorsal foot still with a sizable wound about the size of a border but healthy granulation Right dorsal foot still with a  slitlike area using silver alginate 07/18/18; the patient's cellulitis in the left foot is improved in fact I think it is on its way to resolving. Left buttock wounds 2 both look better although the larger one has hypertension granulation we've been using silver alginate Left heel has some thick circumferential redundant skin over the wound edge which will need to be removed today we've been using Iodoflex Left dorsal foot is still a sizable wound required debridement using silver alginate The right dorsal foot is just about closed only a small open area remains here 07/25/18; left foot cellulitis is resolved Left buttock wounds 2 both look better. Hyper-granulation on the major area Left heel as some debris over the surface but otherwise looks a healthier wound. Using silver collagen Right dorsal foot is just about closed 07/31/18; arrives with  our intake nurse worried about purulent drainage from the buttock. We had hyper-granulation here last week His buttock wounds 2 continue to look better Left heel some debris over the surface but measuring smaller. Right dorsal foot unfortunately has openings between the toes Left foot superficial wound looks less aggravated. 08/07/18 Buttock wounds continue to look better although some of her granulation and the larger medial wound. silver alginate Left heel continues to look a lot better.silver collagen Left foot superficial wound looks less stable. Requires debridement. He has a new wound superficial area on the foot on the lateral dorsal foot. Right foot looks better using silver alginate without Lotrisone 08/14/2018; patient was in the ER last week diagnosed with a UTI. He is now on Cefpodoxime and Macrodantin. Buttock wounds continued to be smaller. Using silver alginate Left heel continues to look better using silver collagen Left foot superficial wound looks as though it is improving Right dorsal foot area is just about healed. 08/21/2018; patient is  completed his antibiotics for his UTI. He has 2 open areas on the buttocks. There is still not closed although the surface looks satisfactory. Using silver alginate Left heel continues to improve using silver collagen The bilateral dorsal foot areas which are at the base of his first and second toes/possible tinea pedis are actually stable on the left but worse on the right. The area on the left required debridement of necrotic surface. After debridement I obtained a specimen for PCR culture. The right dorsal foot which is been just about healed last week is now reopened 08/28/2018; culture done on the left dorsal foot showed coag negative staph both staph epidermidis and Lugdunensis. I think this is worthwhile initiating systemic treatment. I will use doxycycline given his long list of allergies. The area on the left heel slightly improved but still requiring debridement. The large wound on the buttock is just about closed whereas the smaller one is larger. Using silver alginate in this area 09/04/2018; patient is completing his doxycycline for the left foot although this continues to be a very difficult wound area with very adherent necrotic debris. We are using silver alginate to all his wounds right foot left foot and the small wounds on his buttock, silver collagen on the left heel. 09/11/2018; once again this patient has intense erythema and swelling of the left forefoot. Lesser degrees of erythema in the right foot. He has a long list of allergies and intolerances. I will reinstitute doxycycline. 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 Using silver alginate Left heel also looks better using silver collagen Unfortunately both the areas on his feet look worse. The area on the left first second webspace is now gone through to the plantar part of his foot. The area on the left foot anteriorly is irritated with erythema and swelling in the  forefoot. 09/25/2018 His wound on the left plantar heel looks better. Using silver collagen The area on the left buttock 2 small remnant areas. One is closed one is still open. Using silver alginate The areas between both his first and second toes look worse. This in spite of long-standing antifungal therapy with ketoconazole and silver alginate which should have antifungal activity He has small areas around his original wound on the left calf one is on the bottom of the original scar tissue and one superiorly both of these are small and superficial but again given wound history in this site this is  worrisome 10/02/2018 Left plantar heel continues to gradually contract using silver collagen Left buttock wound is unchanged using silver alginate The areas on his dorsal feet between his first and second toes bilaterally look about the same. I prescribed clindamycin ointment to see if we can address chronic staph colonization and also the underlying possibility of erythrasma The left lateral lower extremity wound is actually on the lateral part of his ankle. Small open area here. We have been using silver alginate 10/09/2018; Left plantar heel continues to look healthy and contract. No debridement is required Left buttock slightly smaller with a tape injury wound just below which was new this week Dorsal feet somewhat improved I have been using clindamycin Left lateral looks lower extremity the actual open area looks worse although a lot of this is epithelialized. I am going to change to silver collagen today He has a lot more swelling in the right leg although this is not pitting not red and not particularly warm there is a lot of spasm in the right leg usually indicative of people with paralysis of some underlying discomfort. We have reviewed his vascular status from 2017 he had a left greater saphenous vein ablation. I wonder about referring him back to vascular surgery if the area on the left  leg continues to deteriorate. 10/16/2018 in today for follow-up and management of multiple lower extremity ulcers. His left Buttock wound is much lower smaller and almost closed completely. The wound to the left ankle has began to reopen with Epithelialization and some adherent slough. He has multiple new areas to the left foot and leg. The left dorsal foot without much improvement. Wound present between left great webspace and 2nd toe. Erythema and edema present right leg. Right LE ultrasound obtained on 10/10/18 was negative for DVT . 10/23/2018; Left buttock is closed over. Still dry macerated skin but there is no open wound. I suspect this is chronic pressure/moisture Left lateral calf is quite a bit worse than when I saw this last. There is clearly drainage here he has macerated skin into the left plantar heel. We will change the primary dressing to alginate Left dorsal foot has some improvement in overall wound area. Still using clindamycin and silver alginate Right dorsal foot about the same as the left using clindamycin and silver alginate The erythema in the right leg has resolved. He is DVT rule out was negative Left heel pressure area required debridement although the wound is smaller and the surface is health 10/26/2018 The patient came back in for his nurse check today predominantly because of the drainage coming out of the left lateral leg with a recent reopening of his original wound on the left lateral calf. He comes in today with a large amount of surrounding erythema around the wound extending from the calf into the ankle and even in the area on the dorsal foot. He is not systemically unwell. He is not febrile. Nevertheless this looks like cellulitis. We have been using silver alginate to the area. I changed him to a regular visit and I am going to prescribe him doxycycline. The rationale here is a long list of medication intolerances and a history of MRSA. I did not see anything  that I thought would provide a valuable culture 10/30/2018 Follow-up from his appointment 4 days ago with really an extensive area of cellulitis in the left calf left lateral ankle and left dorsal foot. I put him on doxycycline. He has a long list of medication allergies  which are true allergy reactions. 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 In any case he is a lot better today. The erythema especially in the anterior and lateral left calf is better. He still has left ankle erythema. He also is complaining about increasing edema in the right leg we have only been using Kerlix Coban and he has been doing the wraps at home. Finally he has a spotty rash on the medial part of his upper left calf which looks like folliculitis or perhaps wrap occlusion type injury. Small superficial macules not pustules 11/06/18 patient arrives today with again a considerable degree of erythema around the wound on the left lateral calf extending into the dorsal ankle and dorsal foot. This is a lot worse than when I saw this last week. He is on doxycycline really with not a lot of improvement. He has not been systemically unwell Wounds on the; left heel actually looks improved. Original area on the left foot and proximity to the first and second toes looks about the same. He has superficial areas on the dorsal foot, anterior calf and then the reopening of his original wound on the left lateral calf which looks about the same The only area he has on the right is the dorsal webspace first and second which is smaller. He has a large area of dry erythematous skin on the left buttock small open area here. 11/13/2018; the patient arrives in much better condition. The erythema around the wound on the left lateral calf is a lot better. 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 In any case this is a lot better. The area on the left heel is very small and just about resolved using silver collagen we have been using silver alginate to the areas on his dorsal feet 11/20/2018; his wounds include the left lateral calf, left heel, dorsal aspects of both feet just proximal to the first second webspace. He is stable to slightly improved. I did not think any changes to his dressings were going to be necessary 11/27/2018 he has a reopening on the left buttock which is surrounded by what looks like tinea or perhaps some other form of dermatitis. The area on the left dorsal foot has some erythema around it I have marked this area but I am not sure whether this is cellulitis or not. Left heel is not closed. Left calf the reopening is really slightly longer and probably worse 1/13; in general things look better and smaller except for the left dorsal foot. Area on the left heel is just about closed, left buttock looks better only a small wound remains in the skin looks better [using Lotrisone] 1/20; the area on the left heel only has a few remaining open areas here. Left lateral calf about the same in terms of size, left dorsal foot slightly larger right lateral foot still not closed. The area on the left buttock has no open wound and the surrounding skin looks a lot better 1/27; the area on the left heel is closed. Left lateral calf better but still requiring extensive debridements. The area on his left buttock is closed. He still has the open areas on the left dorsal foot which is slightly smaller in the right foot which is slightly expanded. We have been using Iodoflex on these areas as well 2/3; left heel is  closed. Left lateral calf still requiring debridement using Iodoflex there is no open area on his left buttock however he has dry scaly skin over a large area of this. Not really responding well to the Lotrisone. Finally the areas on his dorsal feet at the level of the  first second webspace are slightly smaller on the right and about the same on the left. Both of these vigorously debrided with Anasept and gauze 2/10; left heel remains closed he has dry erythematous skin over the left buttock but there is no open wound here. Left lateral leg has come in and with. Still requiring debridement we have been using Iodoflex here. Finally the area on the left dorsal foot and right dorsal foot are really about the same extremely dry callused fissured areas. He does not yet have a dermatology appointment 2/17; left heel remains closed. He has a new open area on the left buttock. The area on the left lateral calf is bigger longer and still covered in necrotic debris. No major change in his foot areas bilaterally. I am awaiting for a dermatologist to look on this. We have been using ketoconazole I do not know that this is been doing any good at all. 2/24; left heel remains closed. The left buttock wound that was new reopening last week looks better. The left lateral calf appears better also although still requires debridement. The major area on his foot is the left first second also requiring debridement. We have been putting Prisma on all wounds. I do not believe that the ketoconazole has done too much good for his feet. He will use Lotrisone I am going to give him a 2-week course of terbinafine. We still do not have a dermatology appointment 3/2 left heel remains closed however there is skin over bone in this area I pointed this out to him today. The left buttock wound is epithelialized but still does not look completely stable. The area on the left leg required debridement were using silver collagen here. With regards to his feet we changed to Lotrisone last week and silver alginate. 3/9; left heel remains closed. Left buttock remains closed. The area on the right foot is essentially closed. The left foot remains unchanged. Slightly smaller on the left lateral calf. Using  silver collagen to both of these areas 3/16-Left heel remains closed. Area on right foot is closed. Left lateral calf above the lateral malleolus open wound requiring debridement with easy bleeding. Left dorsal wound proximal to first toe also debrided. Left ischial area open new. Patient has been using Prisma with wrapping every 3 days. Dermatology appointment is apparently tomorrow.Patient has completed his terbinafine 2-week course with some apparent improvement according to him, there is still flaking and dry skin in his foot on the left 3/23; area on the right foot is reopened. The area on the left anterior foot is about the same still a very necrotic adherent surface. He still has the area on the left leg and reopening is on the left buttock. He apparently saw dermatology although I do not have a note. According to the patient who is usually fairly well informed they did not have any good ideas. Put him on oral terbinafine which she is been on before. 3/30; using silver collagen to all wounds. Apparently his dermatologist put him on doxycycline and rifampin presumably some culture grew staph. I do not have this result. He remains on terbinafine although I have used terbinafine on him before 4/6; patient has had  a fairly substantial reopening on the right foot between the first and second toes. He is finished his terbinafine and I believe is on doxycycline and rifampin still as prescribed by dermatology. We have been using silver collagen to all his wounds although the patient reports that he thinks silver alginate does better on the wounds on his buttock. 4/13; the area on his left lateral calf about the same size but it did not require debridement. Left dorsal foot just proximal to the webspace between the first and second toes is about the same. Still nonviable surface. I note some superficial bronze discoloration of the dorsal part of his foot Right dorsal foot just proximal to the first  and second toes also looks about the same. I still think there may be the same discoloration I noted above on the left Left buttock wound looks about the same 4/20; left lateral calf appears to be gradually contracting using silver collagen. 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 The left dorsal foot wound is debrided of tightly adherent necrotic debris and really cleans up quite nicely. The right area is worse with expansion. I did not debride this it is now over the base of the second toe The area on his left buttock is smaller no debridement is required using silver collagen 5/4; left calf continues to make good progress. He arrives with erythema around the wounds on his dorsal foot which even extends to the plantar aspect. Very concerning for coexistent infection. He is finished the erythromycin I gave him for possible erythrasma this does not seem to have helped. The area on the left foot is about the same base of the dorsal toes Is area on the buttock looks improved on the left 5/11; left calf and left buttock continued to make good progress. Left foot is about the same to slightly improved. Major problem is on the right foot. He has not had an x-ray. Deep tissue culture I did last week showed both Enterobacter and E. coli. I did not change the doxycycline I put him on empirically although neither 1 of these were plated to doxycycline. He arrives today with the erythema looking worse on both the dorsal and plantar foot. Macerated skin on the bottom of the foot. he has not been systemically unwell 5/18-Patient returns at 1 week, left calf wound appears to be making some progress, left buttock wound appears slightly worse than last time, left foot wound looks slightly better, right foot redness is marginally better. X-ray of both feet show no air or evidence of osteomyelitis. Patient is finished his Omnicef and terbinafine. He  continues to have macerated skin on the bottom of the left foot as well as right 5/26; left calf wound is better, left buttock wound appears to have multiple small superficial open areas with surrounding macerated skin. X-rays that I did last time showed no evidence of osteomyelitis in either foot. He is finished cefdinir and doxycycline. I do not think that he was on terbinafine. He continues to have a large superficial open area on the right foot anterior dorsal and slightly between the first and second toes. I did send him to dermatology 2 months ago or so wondering about whether they would do a fungal scraping. I do not believe they did but did do a culture. We have been using silver alginate to the toe areas, he has been using antifungals at home topically either ketoconazole or Lotrisone. We are using silver  collagen on the left foot, silver alginate on the right, silver collagen on the left lateral leg and silver alginate on the left buttock 6/1; left buttock area is healed. We have the left dorsal foot, left lateral leg and right dorsal foot. We are using silver alginate to the areas on both feet and silver collagen to the area on his left lateral calf 6/8; the left buttock apparently reopened late last week. He is not really sure how this happened. He is tolerating the terbinafine. Using silver alginate to all wounds 6/15; left buttock wound is larger than last week but still superficial. Came in the clinic today with a report of purulence from the left lateral leg I did not identify any infection Both areas on his dorsal feet appear to be better. He is tolerating the terbinafine. Using silver alginate to all wounds 6/22; left buttock is about the same this week, left calf quite a bit better. His left foot is about the same however he comes in with erythema and warmth in the right forefoot once again. Culture that I gave him in the beginning of May showed Enterobacter and E. coli. I gave him  doxycycline and things seem to improve although neither 1 of these organisms was specifically plated. 6/29; left buttock is larger and dry this week. Left lateral calf looks to me to be improved. Left dorsal foot also somewhat improved right foot completely unchanged. The erythema on the right foot is still present. He is completing the Ceftin dinner that I gave him empirically [see discussion above.) 7/6 - All wounds look to be stable and perhaps improved, the left buttock wound is slightly smaller, per patient bleeds easily, completed ceftin, the right foot redness is less, he is on terbinafine 7/13; left buttock wound about the same perhaps slightly narrower. Area on the left lateral leg continues to narrow. Left dorsal foot slightly smaller right foot about the same. We are using silver alginate on the right foot and Hydrofera Blue to the areas on the left. Unna boot on the left 2 layer compression on the right 7/20; left buttock wound absolutely the same. Area on lateral leg continues to get better. Left dorsal foot require debridement as did the right no major change in the 7/27; left buttock wound the same size necrotic debris over the surface. The area on the lateral leg is closed once again. His left foot looks better right foot about the same although there is some involvement now of the posterior first second toe area. He is still on terbinafine which I have given him for a month, not certain a centimeter major change 06/25/19-All wounds appear to be slightly improved according to report, left buttock wound looks clean, both foot wounds have minimal to no debris the right dorsal foot has minimal slough. We are using Hydrofera Blue to the left and silver alginate to the right foot and ischial wound. 8/10-Wounds all appear to be around the same, the right forefoot distal part has some redness which was not there before, however the wound looks clean and small. Ischial wound looks about the  same with no changes 8/17; his wound on the left lateral calf which was his original chronic venous insufficiency wound remains closed. Since I last saw him the areas on the left dorsal foot right dorsal foot generally appear better but require debridement. The area on his left initial tuberosity appears somewhat larger to me perhaps hyper granulated and bleeds very easily. We have been  using Hydrofera Blue to the left dorsal foot and silver alginate to everything else 8/24; left lateral calf remains closed. The areas on his dorsal feet on the webspace of the first and second toes bilaterally both look better. The area on the left buttock which is the pressure ulcer stage II slightly smaller. I change the dressing to Hydrofera Blue to all areas 8/31; left lateral calf remains closed. The area on his dorsal feet bilaterally look better. Using Hydrofera Blue. Still requiring debridement on the left foot. No change in the left buttock pressure ulcers however 9/14; left lateral calf remains closed. Dorsal feet look quite a bit better than 2 weeks ago. Flaking dry skin also a lot better with the ammonium lactate I gave him 2 weeks ago. The area on the left buttock is improved. He states that his Roho cushion developed a leak and he is getting a new one, in the interim he is offloading this vigorously 9/21; left calf remains closed. Left heel which was a possible DTI looks better this week. He had macerated tissue around the left dorsal foot right foot looks satisfactory and improved left buttock wound. I changed his dressings to his feet to silver alginate bilaterally. Continuing Hydrofera Blue on the left buttock. 9/28 left calf remains closed. Left heel did not develop anything [possible DTI] dry flaking skin on the left dorsal foot. Right foot looks satisfactory. Improved left buttock wound. We are using silver alginate on his feet Hydrofera Blue on the buttock. I have asked him to go back to the  Lotrisone on his feet including the wounds and surrounding areas 10/5; left calf remains closed. The areas on the left and right feet about the same. A lot of this is epithelialized however debris over the remaining open areas. He is using Lotrisone and silver alginate. The area on the left buttock using Hydrofera Blue 10/26. Patient has been out for 3 weeks secondary to Covid concerns. He tested negative but I think his wife tested positive. He comes in today with the left foot substantially worse, right foot about the same. Even more concerning he states that the area on his left buttock closed over but then reopened and is considerably deeper in one aspect than it was before [stage III wound] 11/2; left foot really about the same as last week. Quarter sized wound on the dorsal foot just proximal to the first second toes. Surrounding erythema with areas of denuded epithelium. This is not really much different looking. Did not look like cellulitis this time however. Right foot area about the same.. We have been using silver alginate alginate on his toes Left buttock still substantial irritated skin around the wound which I think looks somewhat better. We have been using Hydrofera Blue here. 11/9; left foot larger than last week and a very necrotic surface. Right foot I think is about the same perhaps slightly smaller. Debris around the circumference also addressed. Unfortunately on the left buttock there is been a decline. Satellite lesions below the major wound distally and now a an additional one posteriorly we have been using Hydrofera Blue but I think this is a pressure issue 11/16; left foot ulcer dorsally again a very adherent necrotic surface. Right foot is about the same. Not much change in the pressure ulcer on his left buttock. 11/30; left foot ulcer dorsally basically the same as when I saw him 2 weeks ago. Very adherent fibrinous debris on the wound surface. Patient reports a lot  of  drainage as well. 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 We have been using Iodoflex, patient changed back to alginate because of the drainage. Area on his right dorsal foot really looks benign with a healthier surface certainly a lot better than on the left. Left buttock wounds all improved using Hydrofera Blue 12/7; left dorsal foot again no improvement. Tightly adherent debris. PCR culture I did last week only showed likely skin contaminant. I have gone ahead and done a punch biopsy of this which is about the last thing in terms of investigations I can think to do. He has known venous insufficiency and venous hypertension and this could be the issue here. The area on the right foot is about the same left buttock slightly worse according to our intake nurse secondary to [facility] Blue sticking to the wound 12/14; biopsy of the left foot that I did last time showed changes that could be related to wound healing/chronic stasis dermatitis phenomenon no neoplasm. We have been using silver alginate to both feet. I change the one on the left today to Sorbact and silver alginate to his other 2 wounds 12/28; the patient arrives with the following problems; Major issue is the dorsal left foot which continues to be a larger deeper wound area. Still with a completely nonviable surface Paradoxically the area mirror image on the right on the right dorsal foot appears to be getting better. He had some loss of dry denuded skin from the lower part of his original wound on the left lateral calf. Some of this area looked a little vulnerable and for this reason we put him in wrap that on this side this week The area on his left buttock is larger. He still has the erythematous circular area which I think is a combination of pressure, sweat. This does not look like cellulitis or fungal dermatitis 11/26/2019; -Dorsal left foot large open wound with depth. Still  debris over the surface. Using Sorbact The area on the dorsal right foot paradoxically has closed over He has a reopening on the left ankle laterally at the base of his original wound that extended up into the calf. This appears clean. The left buttock wound is smaller but with very adherent necrotic debris over the surface. We have been using silver alginate here as well The patient had arterial studies done in 2017. He had biphasic waveforms at the dorsalis pedis and posterior tibial bilaterally. ABI in the left was 1.17. Digit waveforms were dampened. He has slight spasticity in the great toes I do not think a TBI would be possible 1/11; the patient comes in today with a sizable reopening between the first and second toes on the right. This is not exactly in the same location where we have been treating wounds previously. According to our intake nurse this was actually fairly deep but 0.6 cm. The area on the left dorsal foot looks about the same the surface is somewhat cleaner using Sorbact, his MRI is in 2 days. We have not managed yet to get arterial studies. The new reopening on the left lateral calf looks somewhat better using alginate. The left buttock wound is about the same using alginate 1/18; the patient had his ARTERIAL studies which were quite normal. ABI in the right at 1.13 with triphasic/biphasic waveforms on the left ABI 1.06 again with triphasic/biphasic waveforms. It would not have been possible to have done a toe brachial index because of spasticity. We have  been using Sorbac to the left foot alginate to the rest of his wounds on the right foot left lateral calf and left buttock 1/25; arrives in clinic with erythema and swelling of the left forefoot worse over the first MTP area. This extends laterally dorsally and but also posteriorly. Still has an area on the left lateral part of the lower part of his calf wound it is eschared and clearly not closed. Area on the left buttock  still with surrounding irritation and erythema. Right foot surface wound dorsally. The area between the right and first and second toes appears better. 2/1; The left foot wound is about the same. Erythema slightly better I gave him a week of doxycycline empirically Right foot wound is more extensive extending between the toes to the plantar surface Left lateral calf really no open surface on the inferior part of his original wound however the entire area still looks vulnerable Absolutely no improvement in the left buttock wound required debridement. 2/8; the left foot is about the same. Erythema is slightly improved I gave him clindamycin last week. Right foot looks better he is using Lotrimin and silver alginate He has a breakdown in the left lateral calf. Denuded epithelium which I have removed Left buttock about the same were using Hydrofera Blue 2/15; left foot is about the same there is less surrounding erythema. Surface still has tightly adherent debris which I have debriding however not making any progress Right foot has a substantial wound on the medial right second toe between the first and second webspace. Still an open area on the left lateral calf distal area. Buttock wound is about the same 2/22; left foot is about the same less surrounding erythema. Surface has adherent debris. Polymen Ag Right foot area significant wound between the first and second toes. We have been using silver alginate here Left lateral leg polymen Ag at the base of his original venous insufficiency wound Left buttock some improvement here 3/1; Right foot is deteriorating in the first second toe webspace. Larger and more substantial. We have been using silver alginate. Left dorsal foot about the same markedly adherent surface debris using PolyMem Ag Left lateral calf surface debris using PolyMem AG Left buttock is improved again using PolyMem Ag. He is completing his terbinafine. The erythema in the foot  seems better. He has been on this for 2 weeks 3/8; no improvement in any wound area in fact he has a small open area on the dorsal midfoot which is new this week. He has not gotten his foot x-rays yet 3/15; his x-rays were both negative for osteomyelitis of both feet. No major change in any of his wounds on the extremities however his buttock wounds are better. We have been using polymen on the buttocks, left lower leg. Iodoflex on the left foot and silver alginate on the right 3/22; arrives in clinic today with the 2 major issues are the improvement in the left dorsal foot wound which for once actually looks healthy with a nice healthy wound surface without debridement. Using Iodoflex here. Unfortunately on the left lateral calf which is in the distal part of his original wound he came to the clinic here for there was purulent drainage noted some increased breakdown scattered around the original area and a small area proximally. We we are using polymen here will change to silver alginate today. His buttock wound on the left is better and I think the area on the right first second toe webspace is  also improved 3/29; left dorsal foot looks better. Using Iodoflex. Left ankle culture from deterioration last time grew E. coli, Enterobacter and Enterococcus. I will give him a course of cefdinir although that will not cover Enterococcus. The area on the right foot in the webspace of the first and second toe lateral first toe looks better. The area on his buttock is about healed Vascular appointment is on April 21. This is to look at his venous system vis--vis continued breakdown of the wounds on the left including the left lateral leg and left dorsal foot he. He has had previous ablations on this side 4/5; the area between the right first and second toes lateral aspect of the first toe looks better. Dorsal aspect of the left first toe on the left foot also improved. Unfortunately the left lateral lower leg  is larger and there is a second satellite wound superiorly. The usual superficial abrasions on the left buttock overall better but certainly not closed 4/12; the area between the right first and second toes is improved. Dorsal aspect of the left foot also slightly smaller with a vibrant healthy looking surface. No real change in the left lateral leg and the left buttock wound is healed He has an unaffordable co-pay for Apligraf. Appointment with vein and vascular with regards to the left leg venous part of the circulation is on 4/21 4/19; we continue to see improvement in all wound areas. Although this is minor. He has his vascular appointment on 4/21. The area on the left buttock has not reopened although right in the center of this area the skin looks somewhat threatened 4/26; the left buttock is unfortunately reopened. In general his left dorsal foot has a healthy surface and looks somewhat smaller although it was not measured as such. The area between his first and second toe webspace on the right as a small wound against the first toe. The patient saw vascular surgery. The real question I was asking was about the small saphenous vein on the left. He has previously ablated left greater saphenous vein. Nothing further was commented on on the left. Right greater saphenous vein without reflux at the saphenofemoral junction or proximal thigh there was no indication for ablation of the right greater saphenous vein duplex was negative for DVT bilaterally. They did not think there was anything from a vascular surgery point of view that could be offered. They ABIs within normal limits 5/3; only small open area on the left buttock. The area on the left lateral leg which was his original venous reflux is now 2 wounds both which look clean. We are using Iodoflex on the left dorsal foot which looks healthy and smaller. He is down to a very tiny area between the right first and second toes, using  silver alginate 5/10; all of his wounds appear better. We have much better edema control in 4 layer compression on the left. This may be the factor that is allowing the left foot and left lateral calf to heal. He has external compression garments at home 04/14/20-All of his wounds are progressing well, the left forefoot is practically closed, left ischium appears to be about the same, right toe webspace is also smaller. The left lateral leg is about the same, continue using Hydrofera Blue to this, silver alginate to the ischium, Iodoflex to the toe space on the right 6/7; most of his wounds outside of the left buttock are doing well. The area on the left lateral calf and left  dorsal foot are smaller. The area on the right foot in between the first and second toe webspace is barely visible although he still says there is some drainage here is the only reason I did not heal this out. Unfortunately the area on the left buttock almost looks like he has a skin tear from tape. He has open wound and then a large flap of skin that we are trying to get adherence over an area just next to the remaining wound 6/21; 2 week follow-up. I believe is been here for nurse visits. Miraculously the area between his first and second toes on the left dorsal foot is closed over. Still open on the right first second web space. The left lateral calf has 2 open areas. Distally this is more superficial. The proximal area had a little more depth and required debridement of adherent necrotic material. His buttock wound is actually larger we have been using silver alginate here 6/28; the patient's area on the left foot remains closed. Still open wet area between the first and second toes on the right and also extending into the plantar aspect. We have been using silver alginate in this location. He has 2 areas on the left lower leg part of his original long wounds which I think are better. We have been using Hydrofera Blue here.  Hydrofera Blue to the left buttock which is stable 7/12; left foot remains closed. Left ankle is closed. May be a small area between his right first and second toes the only truly open area is on the left buttock. We have been using Hydrofera Blue here 7/19; patient arrives with marked deterioration especially in the left foot and ankle. We did not put him in a compression wrap on the left last week in fact he wore his juxta lite stockings on either side although he does not have an underlying stocking. He has a reopening on the left dorsal foot, left lateral ankle and a new area on the right dorsal ankle. More worrisome is the degree of erythema on the left foot extending on the lateral foot into the lateral lower leg on the left 7/26; the patient had erythema and drainage from the lateral left ankle last week. Culture of this grew MRSA resistant to doxycycline and clindamycin which are the 2 antibiotics we usually use with this patient who has multiple antibiotic allergies including linezolid, trimethoprim sulfamethoxazole. I had give him an empiric doxycycline and he comes in the area certainly looks somewhat better although it is blotchy in his lower leg. He has not been systemically unwell. He has had areas on the left dorsal foot which is a reopening, chronic wounds on the left lateral ankle. Both of these I think are secondary to chronic venous insufficiency. The area between his first and second toes is closed as far as I can tell. He had a new wrap injury on the right dorsal ankle last week. Finally he has an area on the left buttock. We have been using silver alginate to everything except the left buttock we are using Hydrofera Blue 06/30/20-Patient returns at 1 week, has been given a sample dose pack of NUZYRA which is a tetracycline derivative [omadacycline], patient has completed those, we have been using silver alginate to almost all the wounds except the left ischium where we are using  Hydrofera Blue all of them look better 8/16; since I last saw the patient he has been doing well. The area on the left buttock, left lateral  ankle and left foot are all closed today. He has completed the [place] I gave him last time and tolerated this well. He still has open areas on the right dorsal ankle and in the right first second toe area which we are using silver alginate. 8/23; we put him in his bilateral external compression stockings last week as he did not have anything open on either leg except for concerning area between the right first and second toe. He comes in today with an area on the left dorsal foot slightly more proximal than the original wound, the left lateral foot but this is actually a continuation of the area he had on the left lateral ankle from last time. As well he is opened up on the left buttock again. 8/30; comes in today with things looking a lot better. The area on the left lower ankle has closed down as has the left foot but with eschar in both areas. The area on the dorsal right ankle is also epithelialized. Very little remaining of the left buttock wound. We have been using silver alginate on all wound areas 9/13; the area in the first second toe webspace on the right has fully epithelialized. He still has some vulnerable epithelium on the right and the ankle and the dorsal foot. He notes weeping. He is using his juxta lite stocking. On the left again the left dorsal foot is closed left lateral ankle is closed. We went to the juxta lite stocking here as well. Still vulnerable in the left buttock although only 2 small open areas remain here 9/27; 2-week follow-up. We did not look at his left leg but the patient says everything is closed. He is a bit disturbed by the amount of edema in his left foot he is using juxta lite stockings but asking about over the toes stockings which would be 30/40, will talk to him next time. According to him there is no open wound on  either the left foot or the left ankle/calf He has an open area on the dorsal right calf which I initially point a wrap injury. He has superficial remaining wound on the left ischial tuberosity been using silver alginate although he says this sticks to the wound 10/5; we gave him 2-week follow-up but he called yesterday expressing some concerns about his right foot right ankle and the left buttock. He came in early. There is still no open areas on the left leg and that still in his juxta lite stocking 10/11; he only has 1 small area on the left buttock that remains measuring millimeters 1 mm. Still has the same irritated skin in this area. We recommended zinc oxide when this eventually closes and pressure relief is meticulously is he can do this. He still has an area on the dorsal part of his right first through third toes which is a bit irritated and still open and on the dorsal ankle near the crease of the ankle. We have been using silver alginate and using his own stocking. He has nothing open on the left leg or foot 10/25; 2-week follow-up. Not nearly as good on the left buttock as I was hoping. For open areas with 5 looking threatened small. He has the erythematous irritated chronic skin in this area. 1 area on the right dorsal ankle. He reports this area bleeds easily Right dorsal foot just proximal to the base of his toes We have been using silver alginate. 11/8; 2-week follow-up. Left buttock is about the same  although I do not think the wounds are in the same location we have been using silver alginate. I have asked him to use zinc oxide on the skin around the wounds. He still has a small area on the right dorsal ankle he reports this bleeds easily Right dorsal foot just proximal to the base of the toes does not have anything open although the skin is very dry and scaly He has a new opening on the nailbed of the left great toe. Nothing on the left ankle 11/29; 3-week follow-up. Left  buttock has 2 open areas. And washing of these wounds today started bleeding easily. Suggesting very friable tissue. We have been using silver alginate. Right dorsal ankle which I thought was initially a wrap injury we have been using silver alginate. Nothing open between the toes that I can see. He states the area on the left dorsal toe nailbed healed after the last visit in 2 or 3 days 12/13; 3-week follow-up. His left buttock now has 3 open areas but the original 2 areas are smaller using polymen here. Surrounding skin looks better. The right dorsal ankle is closed. He has a small opening on the right dorsal foot at the level of the third toe. In general the skin looks better here. He is wearing his juxta lite stocking on the left leg says there is nothing open 11/24/2020; 3 weeks follow-up. His left buttock still has the 3 open areas. We have been using polymen but due to lack of response he changed to [facility] area. Surrounding skin is dry erythematous and irritated looking. There is no evidence of infection either bacterial or fungal however there is loss of surface epithelium He still has very dry skin in his foot causing irritation and erythema on the dorsal part of his toes. This is not responded to prolonged courses of antifungal simply looks dry and irritated Electronic Signature(s) Signed: 11/24/2020 3:00:47 PM By: Linton Ham MD Entered By: Linton Ham on 11/24/2020 08:49:07 -------------------------------------------------------------------------------- Physical Exam Details Patient Name: Date of Service: Kemnitz, [name] 11/24/2020 7:30 A M Medical Record Number: [id number] Patient Account Number: [id number] Date of Birth/Sex: Treating RN: 09-22-1986 (35 y.o. [name] Primary Care Provider: [name], [name] Other Clinician: Referring Provider: Treating Provider/Extender: [name]: 255 Notes Wound exam Left buttock  absolutely no improvement. He still has a 3 open areas. Individually these are superficial but the condition of the skin around this has not made any difference at all this does not look to be infected either bacterial or fungal. Right dorsal foot about the same. I do not know that we are going to be able to get this much better Electronic Signature(s) Signed: 11/24/2020 3:00:47 PM By: Linton Ham MD Entered By: Linton Ham on 11/24/2020 08:50:12 -------------------------------------------------------------------------------- Physician Orders Details Patient Name: Date of Service: Faulk, [name] 11/24/2020 7:30 A M Medical Record Number: [id number] Patient Account Number: [id number] Date of Birth/Sex: Treating RN: [birth year] (35 y.o. [name] Primary Care Provider: [name], [name] Other Clinician: Referring Provider: Treating Provider/Extender: [name]: [phone] Verbal / Phone Orders: No Diagnosis Coding ICD-10 Coding Code Description I87.332 Chronic venous hypertension (idiopathic) with ulcer and inflammation of left lower extremity L97.321 Non-pressure chronic ulcer of left ankle limited to breakdown of skin L97.311 Non-pressure chronic ulcer of right ankle limited to breakdown of skin L97.511 Non-pressure chronic ulcer of other part of right foot limited to breakdown of skin  L97.521 Non-pressure chronic ulcer of other part of left foot limited to breakdown of 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 Bathing/ Shower/ Hygiene May shower and wash wound with soap and water. - on days that dressing is changed Edema Control - Lymphedema / SCD / Other Elevate legs to the level of the heart or above for 30 minutes daily and/or when sitting, a frequency of: - throughout the day Compression stocking or Garment 30-40 mm/Hg pressure to: - Juxtalite to  both legs daily Off-Loading Roho cushion for wheelchair Turn and reposition every 2 hours Wound Treatment Wound #38R - T - Web between 1st and 2nd oe Wound Laterality: Right Cleanser: Soap and Water Every Other Day/30 Days Discharge Instructions: May shower and wash wound with dial antibacterial soap and water prior to dressing change. Peri-Wound Care: Sween Lotion (Moisturizing lotion) Every Other Day/30 Days Discharge Instructions: Apply moisturizing lotion as directed Prim Dressing: KerraCel Ag Gelling Fiber Dressing, 2x2 in (silver alginate) Every Other Day/30 Days ary Discharge Instructions: Apply silver alginate to wound bed as instructed Secondary Dressing: Woven Gauze Sponge, Non-Sterile 4x4 in Every Other Day/30 Days Discharge Instructions: Apply over primary dressing as directed. Secured With: [employer], 4.5x3.1 (in/yd) Every Other Day/30 Days Discharge Instructions: Secure with Kerlix as directed. 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 Peri-Wound Care: Triad Hydrophilic Wound Dressing Tube, 6 (oz) (DME) (Generic) Every Other Day/30 Days Discharge Instructions: Apply to periwound with each dressing change Prim Dressing: Hydrofera Blue Ready Foam, 2.5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 Hydrofera Blue difficult areas between his toes first and third on the left than first and second on the right. I'm going to change to silver alginate from silver collagen. Continue ketoconazole as I suspect underlying tinea pedis 05/26/17; left lateral leg venous insufficiency wound. We've been using Hydrofera Blue. I believe that there is expanding epithelialization over the surface of the wound albeit not coming from the wound circumference. This is a bit of an odd situation in which the epithelialization seems to be coming from the surface of the wound rather than in the exact circumference. There is still small open areas mostly along the lateral margin of the wound. ooHe has unchanged areas between the left first and second and the right first second toes which I been treating for tenia pedis 06/02/17; left lateral leg venous insufficiency wound. We have been using Hydrofera Blue. Somewhat smaller from the wound circumference. The surface of the wound remains a bit on it almost epithelialized sedation in appearance. I use an open curette today debridement in the surface of all of this especially the edges ooSmall open wounds remaining on the dorsal right first and second toe interspace and the plantar left first second toe and her face on the left 06/09/17; wound on the left lateral leg continues to be smaller but very gradual and very dry surface using Hydrofera Blue 06/16/17 requires weekly debridements now on the left lateral leg although this continues to contract. I changed to silver collagen last week because of dryness of the wound bed. Using Iodoflex to the areas on his first and second toes/web space bilaterally 06/24/17; patient  with history of paraplegia also chronic venous insufficiency with lymphedema. Has a very difficult wound on the left lateral leg. This has been gradually reducing in terms of with but comes in with a very dry adherent surface. High switch to silver collagen a week or so ago with hydrogel to keep the area moist. This is been refractory to multiple dressing attempts. He also has areas in his first and second toes bilaterally in the anterior and posterior web space. I had been using Iodoflex here after a prolonged course of silver alginate with ketoconazole was ineffective [question tinea pedis] 07/14/17; patient arrives today with a very difficult adherent material over his left lateral lower leg wound. He also has surrounding erythema and poorly controlled edema. He was switched his Santyl last visit which the nurses are applying once during his doctor visit and once on a nurse visit. He was also reduced to 2 layer compression I'm not exactly sure of the issue here. 07/21/17; better surface today after 1 week of Iodoflex. Significant cellulitis that we treated last week also better. [Doxycycline] 07/28/17 better surface today with now 2 weeks of Iodoflex. Significant cellulitis treated with doxycycline. He has now completed the doxycycline and he is back to his usual degree of chronic venous inflammation/stasis dermatitis. He reminds me  he has had ablations surgery here 08/04/17; continued improvement with Iodoflex to the left lateral leg wound in terms of the surface of the wound although the dimensions are better. 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 Problematic areas on the plantar aspect of the first second toe web space on the left and the dorsal aspect of the first second toe web space on the right. At one point I felt these were probably related to chronic fungal infections in treated him aggressively for this although we have  not made any improvement here. 08/11/17; left lateral leg. Surface continues to improve with the Iodoflex although we are not seeing much improvement in overall wound dimensions. Areas on his plantar left foot and right foot show no improvement. In fact the right foot looks somewhat worse 08/18/17; left lateral leg. We changed to [facility] Blue last week after a prolonged course of Iodoflex which helps get the surface better. It appears that the wound with is improved. Continue with difficult areas on the left dorsal first second and plantar first second on the right 09/01/17; patient arrives in clinic today having had a temperature of 103 yesterday. He was seen in the ER and [facility] The patient was concerned he could have cellulitis again in the right leg however they diagnosed him with a UTI and he is now on Keflex. He has a history of cellulitis which is been recurrent and difficult but this is been in the left leg, in the past 5 use doxycycline. He does in and out catheterizations at home which are risk factors for UTI 09/08/17; patient will be completing his Keflex this weekend. The erythema on the left leg is considerably better. He has a new wound today on the medial part of the right leg small superficial almost looks like a skin tear. He has worsening of the area on the right dorsal first and second toe. His major area on the left lateral leg is better. Using Hydrofera Blue on all areas 09/15/17; gradual reduction in width on the long wound in the left lateral leg. No debridement required. He also has wounds on the plantar aspect of his left first second toe web space and on the dorsal aspect of the right first second toe web space. 09/22/17; there continues to be very gradual improvements in the dimensions of the left lateral leg wound. He hasn't round erythematous spot with might be pressure on his wheelchair. There is no evidence obviously of infection no purulence no warmth ooHe has a dry  scaled area on the plantar aspect of the left first second toe ooImproved area on the dorsal right first second toe. 09/29/17; left lateral leg wound continues to improve in dimensions mostly with an is still a fairly long but increasingly narrow wound. ooHe has a dry scaled area on the plantar aspect of his left first second toe web space ooIncreasingly concerning area on the dorsal right first second toe. In fact I am concerned today about possible cellulitis around this wound. The areas extending up his second toe and although there is deformities here almost appears to abut on the nailbed. 10/06/17; left lateral leg wound continues to make very gradual progress. Tissue culture I did from the right first second toe dorsal foot last time grew MRSA and enterococcus which was vancomycin sensitive. This was not sensitive to clindamycin or doxycycline. He is allergic to Zyvox and sulfa we have therefore arrange for him  to have dalvance infusion tomorrow. He is had this in the past and tolerated it well 10/20/17; left lateral leg wound continues to make decent progress. This is certainly reduced in terms of with there is advancing epithelialization.ooThe cellulitis in the right foot looks better although he still has a deep wound in the dorsal aspect of the first second toe web space. Plantar left first toe web space on the left I think is making some progress 10/27/17; left lateral leg wound continues to make decent progress. Advancing epithelialization.using Hydrofera Blue ooThe right first second toe web space wound is better-looking using silver alginate ooImprovement in the left plantar first second toe web space. Again using silver alginate 11/03/17 left lateral leg wound continues to make decent progress albeit slowly. Using Hydrofera Blue ooThe right per second toe web space continues to be a very problematic looking punched out wound. I obtained a piece of tissue for deep culture I  did extensively treated this for fungus. It is difficult to imagine that this is a pressure area as the patient states other than going outside he doesn't really wear shoes at home ooThe left plantar first second toe web space looked fairly senescent. Necrotic edges. This required debridement oochange to Hydrofera Blue to all wound areas 11/10/17; left lateral leg wound continues to contract. Using Hydrofera Blue ooOn the right dorsal first second toe web space dorsally. Culture I did of this area last week grew MRSA there is not an easy oral option in this patient was multiple antibiotic allergies or intolerances. This was only a rare culture isolate I'm therefore going to use Bactroban under silver alginate ooOn the left plantar first second toe web space. Debridement is required here. This is also unchanged 11/17/17; left lateral leg wound continues to contract using Hydrofera Blue this is no longer the major issue. ooThe major concern here is the right first second toe web space. He now has an open area going from dorsally to the plantar aspect. There is now wound on the inner lateral part of the first toe. Not a very viable surface on this. There is erythema spreading medially into the forefoot. ooNo major change in the left first second toe plantar wound 11/24/17; left lateral leg wound continues to contract using Hydrofera Blue. Nice improvement today ooThe right first second toe web space all of this looks a lot less angry than last week. I have given him clindamycin and topical Bactroban for MRSA and terbinafine for the possibility of underlining tinea pedis that I could not control with ketoconazole. Looks somewhat better ooThe area on the plantar left first second toe web space is weeping with dried debris around the wound 12/01/17; left lateral leg wound continues to contract he Hydrofera Blue. It is becoming thinner in terms of with nevertheless it is making good  improvement. ooThe right first second toe web space looks less angry but still a large necrotic-looking wounds starting on the plantar aspect of the right foot extending between the toes and now extensively on the base of the right second toe. I gave him clindamycin and topical Bactroban for MRSA anterior benefiting for the possibility of underlying tinea pedis. Not looking better today ooThe area on the left first/second toe looks better. Debrided of necrotic debris 12/05/17* the patient was worked in urgently today because over the weekend he found blood on his incontinence bad when he woke up. He was found to have an ulcer by his wife who does most of his wound  care. He came in today for [place] to look at this. He has not had a history of wounds in his buttocks in spite of his paraplegia. 12/08/17; seen in follow-up today at his usual appointment. He was seen earlier this week and found to have a new wound on his buttock. We also follow him for wounds on the left lateral leg, left first second toe web space and right first second toe web space 12/15/17; we have been using Hydrofera Blue to the left lateral leg which has improved. The right first second toe web space has also improved. Left first second toe web space plantar aspect looks stable. The left buttock has worsened using Santyl. Apparently the buttock has drainage 12/22/17; we have been using Hydrofera Blue to the left lateral leg which continues to improve now 2 small wounds separated by normal skin. He tells [place] he had a fever up to 100 yesterday he is prone to UTIs but has not noted anything different. He does in and out catheterizations. The area between the first and second toes today does not look good necrotic surface covered with what looks to be purulent drainage and erythema extending into the third toe. I had gotten this to something that I thought look better last time however it is not look good today. He also has a necrotic surface  over the buttock wound which is expanded. I thought there might be infection under here so I removed a lot of the surface with a #5 curet though nothing look like it really needed culturing. He is been using Santyl to this area 12/27/17; his original wound on the left lateral leg continues to improve using Hydrofera Blue. I gave him samples of Baxdella although he was unable to take them out of fear for an allergic reaction ["lump in his throat"].the culture I did of the purulent drainage from his second toe last week showed both enterococcus and a set Enterobacter I was also concerned about the erythema on the bottom of his foot although paradoxically although this looks somewhat better today. 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 We've been using silver alginate here. They came up today that he sleeps in a recliner, I'm not sure why but I asked him to stop this 01/03/18; his original wound we've been using Hydrofera Blue is now separated into 2 areas. ooUlcer on his left buttock is better he is off the recliner and sleeping in bed ooFinally both wound areas between his first and second toes also looks some better 01/10/18; his original wound on the left lateral leg is now separated into 2 wounds we've been using Hydrofera Blue ooUlcer on his left buttock has some drainage. There is a small probing site going into muscle layer superiorly.using silver alginate -He arrives today with a deep tissue injury on the left heel ooThe wound on the dorsal aspect of his first second toe on the left looks a lot betterusing silver alginate ketoconazole ooThe area on the first second toe web space on the right also looks a lot bette 01/17/18; his original wound on the left lateral leg continues to progress using Hydrofera Blue ooUlcer on his left buttock also is smaller surface healthier except for a small probing  site going into the muscle layer superiorly. 2.4 cm of tunneling in this area ooDTI on his left heel we have only been offloading. Looks better than last week  no threatened open no evidence of infection oothe wound on the dorsal aspect of the first second toe on the left continues to look like it's regressing we have only been using silver alginate and terbinafine orally ooThe area in the first second toe web space on the right also looks to be a lot better using silver alginate and terbinafine I think this was prompted by tinea pedis 01/31/18; the patient was hospitalized in [place] last week apparently for a complicated UTI. He was discharged on cefepime he does in and out catheterizations. In the hospital he was discovered M I don't mild elevation of AST and ALT and the terbinafine was stopped.predictably the pressure ulcer on s his buttock looks betterusing silver alginate. The area on the left lateral leg also is better using Hydrofera Blue. The area between the first and second toes on the left better. First and second toes on the right still substantial but better. Finally the DTI on the left heel has held together and looks like it's resolving 02/07/18-he is here in follow-up evaluation for multiple ulcerations. He has new injury to the lateral aspect of the last issue a pressure ulcer, he states this is from adhesive removal trauma. He states he has tried multiple adhesive products with no success. All other ulcers appear stable. The left heel DTI is resolving. We will continue with same treatment plan and follow-up next week. 02/14/18; follow-up for multiple areas. ooHe has a new area last week on the lateral aspect of his pressure ulcer more over the posterior trochanter. The original pressure ulcer looks quite stable has healthy granulation. 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 Almost fully epithelialized on the original superior area using Hydrofera Blue ooDTI on the left heel has peeled off this week to reveal a small superficial wound under denuded skin and subcutaneous tissue ooBoth areas between the first and second toes look better including nothing open on the left 02/21/18; ooThe patient's wounds on his left ischial tuberosity and posterior left greater trochanter actually looked better. He has a large area of irritation around the area which I think is contact dermatitis. I am doubtful that this is fungal ooHis original wound on the left lateral calf continues to improve we have been using Hydrofera Blue ooThere is no open area in the left first second toe web space although there is a lot of thick callus ooThe DTI on the left heel required debridement today of necrotic surface eschar and subcutaneous tissue using silver alginate ooFinally the area on the right first second toe webspace continues to contract using silver alginate and ketoconazole 02/28/18 ooLeft ischial tuberosity wounds look better using silver alginate. ooOriginal wound on the left calf only has one small open area left using Hydrofera Blue ooDTI on the left heel required debridement mostly removing skin from around this wound surface. Using silver alginate ooThe areas on the right first/second toe web space using silver alginate and ketoconazole 03/08/18 on evaluation today patient appears to be doing decently well as best I can tell in regard to his wounds. This is the first time that I have seen him as he generally is followed by Dr. Dellia Nims. With that being said none of his wounds appear to be infected he does have an area where there is some skin covering what appears to be a new wound on the left dorsal surface of his great toe.  This is right at the nail bed. With that being said I do believe that debrided away some of the excess skin can be of benefit in this regard.  Otherwise he has been tolerating the dressing changes without complication. 03/14/18; patient arrives today with the multiplicity of wounds that we are following. He has not been systemically unwell ooOriginal wound on the left lateral calf now only has 2 small open areas we've been using Hydrofera Blue which should continue ooThe deep tissue injury on the left heel requires debridement today. We've been using silver alginate ooThe left first second toe and the right first second toe are both are reminiscence what I think was tinea pedis. Apparently some of the callus Surface between the toes was removed last week when it started draining. ooPurulent drainage coming from the wound on the ischial tuberosity on the left. 03/21/18-He is here in follow-up evaluation for multiple wounds. 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 He tolerated debridement. The only change to last week's recommendations is to discontinue antifungal cream between toes. He will follow-up next week 03/28/18; following up for multiple wounds;Concern this week is streaking redness and swelling in the right foot. He is going to need antibiotics for this. 03/31/18; follow-up for right foot cellulitis. Streaking redness and swelling in the right foot on 03/28/18. 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 I put him on clindamycin 300 mg every 6 and brought him in for a quick check. He has an open wound between his first and second toes on the right foot as a potential source. 04/04/18; ooRight foot cellulitis is resolving he is completing clindamycin. This is truly good news ooLeft lateral calf wound which is initial wound only has one small open area inferiorly this is close to healing out. He has compression stockings. We will use Hydrofera Blue right down to the epithelialization of this ooNonviable surface on the left heel which was  initially pressure with a DTI. We've been using Hydrofera Blue. I'm going to switch this back to silver alginate ooLeft first second toe/tinea pedis this looks better using silver alginate ooRight first second toe tinea pedis using silver alginate ooLarge pressure ulcers on theLeft ischial tuberosity. Small wound here Looks better. I am uncertain about the surface over the large wound. Using silver alginate 04/11/18; ooCellulitis in the right foot is resolved ooLeft lateral calf wound which was his original wounds still has 2 tiny open areas remaining this is just about closed ooNonviable surface on the left heel is better but still requires debridement ooLeft first second toe/tinea pedis still open using silver alginate ooRight first second toe wound tinea pedis I asked him to go back to using ketoconazole and silver alginate ooLarge pressure ulcers on the left ischial tuberosity this shear injury here is resolved. Wound is smaller. No evidence of infection using silver alginate 04/18/18; ooPatient arrives with an intense area of cellulitis in the right mid lower calf extending into the right heel area. Bright red and warm. Smaller area on the left anterior leg. He has a significant history of MRSA. He will definitely need antibioticsoodoxycycline ooHe now has 2 open areas on the left ischial tuberosity the original large wound and now a satellite area which I think was above his initial satellite areas. Not a wonderful surface on this satellite area surrounding erythema which looks like pressure related. ooHis left lateral calf wound again his original wound is just  about closed ooLeft heel pressure injury still requiring debridement ooLeft first second toe looks a lot better using silver alginate ooRight first second toe also using silver alginate and ketoconazole cream also looks better 04/20/18; the patient was worked in early today out of concerns with his cellulitis on the  right leg. I had started him on doxycycline. This was 2 days ago. His wife was concerned about the swelling in the area. Also concerned about the left buttock. He has not been systemically unwell no fever chills. No nausea vomiting or diarrhea 04/25/18; the patient's left buttock wound is continued to deteriorate he is using Hydrofera Blue. He is still completing clindamycin for the cellulitis on the right leg although all of this looks better. 05/02/18 ooLeft buttock wound still with a lot of drainage and a very tightly adherent fibrinous necrotic surface. He has a deeper area superiorly ooThe left lateral calf wound is still closed ooDTI wound on the left heel necrotic surface especially the circumference using Iodoflex ooAreas between his left first second toe and right first second toe both look better. Dorsally and the right first second toe he had a necrotic surface although at smaller. In using silver alginate and ketoconazole. I did a culture last week which was a deep tissue culture of the reminiscence of the open wound on the right first second toe dorsally. This grew a few Acinetobacter and a few methicillin-resistant staph aureus. Nevertheless the area actually this week looked better. I didn't feel the need to specifically address this at least in terms of systemic antibiotics. 05/09/18; wounds are measuring larger more drainage per our intake. We are using Santyl covered with alginate on the large superficial buttock wounds, Iodosorb on the left heel, ketoconazole and silver alginate to the dorsal first and second toes bilaterally. 05/16/18; ooThe area on his left buttock better in some aspects although the area superiorly over the ischial tuberosity required an extensive debridement.using Santyl ooLeft heel appears stable. Using Iodoflex ooThe areas between his first and second toes are not bad however there is spreading erythema up the dorsal aspect of his left foot this looks  like cellulitis again. He is insensate the erythema is really very brilliant.o Erysipelas He went to see an allergist days ago because he was itching part of this he had lab work done. This showed a white count of 15.1 with 70% neutrophils. Hemoglobin of 11.4 and a platelet count of 659,000. Last white count we had in Epic was a 2-1/2 years ago which was 25.9 but he was ill at the time. He was able to show me some lab work that was done by his primary physician the pattern is about the same. 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 But prompted me to go ahead and do x-rays of both feet and the pelvis rule out osteomyelitis. He also had a comprehensive metabolic panel this was reasonably normal his albumin was 3.7 liver function tests BUN/creatinine all normal 05/23/18; x-rays of both his feet from last week were negative for underlying pulmonary abnormality. The x-ray of his pelvis however showed mild irregularity in the left ischial which may represent some early osteomyelitis. The wound in the left ischial continues to get deeper clearly now exposed muscle. Each week necrotic surface material over this area. Whereas the rest of the wounds do not look so bad. ooThe left ischial wound we have been using Santyl and calcium alginate ooT the left heel  surface necrotic debris using Iodoflex o ooThe left lateral leg is still healed ooAreas on the left dorsal foot and the right dorsal foot are about the same. There is some inflammation on the left which might represent contact dermatitis, fungal dermatitis I am doubtful cellulitis although this looks better than last week 05/30/18; CT scan done at Hospital did not show any osteomyelitis or abscess. Suggested the possibility of underlying cellulitis although I don't see a lot of evidence of this at the bedside ooThe wound itself on the left buttock/upper thigh actually looks somewhat better. No  debridement ooLeft heel also looks better no debridement continue Iodoflex ooBoth dorsal first second toe spaces appear better using Lotrisone. Left still required debridement 06/06/18; ooIntake reported some purulent looking drainage from the left gluteal wound. Using Santyl and calcium alginate ooLeft heel looks better although still a nonviable surface requiring debridement ooThe left dorsal foot first/second webspace actually expanding and somewhat deeper. I may consider doing a shave biopsy of this area ooRight dorsal foot first/second webspace appears stable to improved. Using Lotrisone and silver alginate to both these areas 06/13/18 ooLeft gluteal surface looks better. Now separated in the 2 wounds. No debridement required. Still drainage. We'll continue silver alginate ooLeft heel continues to look better with Iodoflex continue this for at least another week ooOf his dorsal foot wounds the area on the left still has some depth although it looks better than last week. We've been using Lotrisone and silver alginate 06/20/18 ooLeft gluteal continues to look better healthy tissue ooLeft heel continues to look better healthy granulation wound is smaller. He is using Iodoflex and his long as this continues continue the Iodoflex ooDorsal right foot looks better unfortunately dorsal left foot does not. There is swelling and erythema of his forefoot. He had minor trauma to this several days ago but doesn't think this was enough to have caused any tissue injury. Foot looks like cellulitis, we have had this problem before 06/27/18 on evaluation today patient appears to be doing a little worse in regard to his foot ulcer. 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 Both of which would really be his only options for treating this infection. In the past he  has been given and effusion of Orbactiv. This is done very well for him in the past again it's one time dosing IV antibiotic therapy. Subsequently I do believe this is something we're gonna need to see about doing at this point in time. Currently his other wounds seem to be doing somewhat better in my pinion I'm pretty happy in that regard. 07/03/18 on evaluation today patient's wounds actually appear to be doing fairly well. He has been tolerating the dressing changes without complication. All in all he seems to be showing signs of improvement. In regard to the antibiotics he has been dealing with infectious disease since I saw him last week as far as getting this scheduled. In the end [facility]'s [facility] this coming Friday. In the meantime he has been continuing to perform the dressing changes in such as previous. There does not appear to be any evidence of infection worsengin at this time. 07/10/18; ooSince I last saw this man 2 weeks ago things have actually improved. IV antibiotics of resulted in less forefoot erythema although there is  still some present. He is not systemically unwell ooLeft buttock wounds o2 now have no depth there is increased epithelialization Using silver alginate ooLeft heel still requires debridement using Iodoflex ooLeft dorsal foot still with a sizable wound about the size of a border but healthy granulation ooRight dorsal foot still with a slitlike area using silver alginate 07/18/18; the patient's cellulitis in the left foot is improved in fact I think it is on its way to resolving. ooLeft buttock wounds o2 both look better although the larger one has hypertension granulation we've been using silver alginate ooLeft heel has some thick circumferential redundant skin over the wound edge which will need to be removed today we've been using Iodoflex ooLeft dorsal foot is still a sizable wound required  debridement using silver alginate ooThe right dorsal foot is just about closed only a small open area remains here 07/25/18; left foot cellulitis is resolved ooLeft buttock wounds o2 both look better. Hyper-granulation on the major area ooLeft heel as some debris over the surface but otherwise looks a healthier wound. Using silver collagen ooRight dorsal foot is just about closed 07/31/18; arrives with our intake nurse worried about purulent drainage from the buttock. We had hyper-granulation here last week ooHis buttock wounds o2 continue to look better ooLeft heel some debris over the surface but measuring smaller. ooRight dorsal foot unfortunately has openings between the toes ooLeft foot superficial wound looks less aggravated. 08/07/18 ooButtock wounds continue to look better although some of her granulation and the larger medial wound. silver alginate ooLeft heel continues to look a lot better.silver collagen ooLeft foot superficial wound looks less stable. Requires debridement. He has a new wound superficial area on the foot on the lateral dorsal foot. ooRight foot looks better using silver alginate without Lotrisone 08/14/2018; patient was in the ER last week diagnosed with a UTI. He is now on Cefpodoxime and Macrodantin. ooButtock wounds continued to be smaller. Using silver alginate ooLeft heel continues to look better using silver collagen ooLeft foot superficial wound looks as though it is improving ooRight dorsal foot area is just about healed. 08/21/2018; patient is completed his antibiotics for his UTI. ooHe has 2 open areas on the buttocks. There is still not closed although the surface looks satisfactory. Using silver alginate ooLeft heel continues to improve using silver collagen ooThe bilateral dorsal foot areas which are at the base of his first and second toes/possible tinea pedis are actually stable on the left but worse on the right. The area on the left  required debridement of necrotic surface. After debridement I obtained a specimen for PCR culture. ooThe right dorsal foot which is been just about healed last week is now reopened 08/28/2018; culture done on the left dorsal foot showed coag negative staph both staph epidermidis and Lugdunensis. I think this is worthwhile initiating systemic treatment. I will use doxycycline given his long list of allergies. The area on the left heel slightly improved but still requiring debridement. ooThe large wound on the buttock is just about closed whereas the smaller one is larger. Using silver alginate in this area 09/04/2018; patient is completing his doxycycline for the left foot although this continues to be a very difficult wound area with very adherent necrotic debris. We are using silver alginate to all his wounds right foot left foot and the small wounds on his buttock, silver collagen on the left heel. 09/11/2018; once again this patient has intense erythema and swelling of the left forefoot. Lesser degrees of  erythema in the right foot. He has a long list of allergies and intolerances. I will reinstitute doxycycline. 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 Using silver alginate ooLeft heel also looks better using silver collagen ooUnfortunately both the areas on his feet look worse. The area on the left first second webspace is now gone through to the plantar part of his foot. The area on the left foot anteriorly is irritated with erythema and swelling in the forefoot. 09/25/2018 ooHis wound on the left plantar heel looks better. Using silver collagen ooThe area on the left buttock 2 small remnant areas. One is closed one is still open. Using silver alginate ooThe areas between both his first and second toes look worse. This in spite of long-standing antifungal therapy with ketoconazole and silver alginate which should have antifungal  activity ooHe has small areas around his original wound on the left calf one is on the bottom of the original scar tissue and one superiorly both of these are small and superficial but again given wound history in this site this is worrisome 10/02/2018 ooLeft plantar heel continues to gradually contract using silver collagen ooLeft buttock wound is unchanged using silver alginate ooThe areas on his dorsal feet between his first and second toes bilaterally look about the same. 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 Small open area here. We have been using silver alginate 10/09/2018; ooLeft plantar heel continues to look healthy and contract. No debridement is required ooLeft buttock slightly smaller with a tape injury wound just below which was new this week ooDorsal feet somewhat improved I have been using clindamycin ooLeft lateral looks lower extremity the actual open area looks worse although a lot of this is epithelialized. I am going to change to silver collagen today He has a lot more swelling in the right leg although this is not pitting not red and not particularly warm there is a lot of spasm in the right leg usually indicative of people with paralysis of some underlying discomfort. We have reviewed his vascular status from 2017 he had a left greater saphenous vein ablation. I wonder about referring him back to vascular surgery if the area on the left leg continues to deteriorate. 10/16/2018 in today for follow-up and management of multiple lower extremity ulcers. His left Buttock wound is much lower smaller and almost closed completely. The wound to the left ankle has began to reopen with Epithelialization and some adherent slough. He has multiple new areas to the left foot and leg. The left  dorsal foot without much improvement. Wound present between left great webspace and 2nd toe. Erythema and edema present right leg. Right LE ultrasound obtained on 10/10/18 was negative for DVT . 10/23/2018; ooLeft buttock is closed over. Still dry macerated skin but there is no open wound. I suspect this is chronic pressure/moisture ooLeft lateral calf is quite a bit worse than when I saw this last. There is clearly drainage here he has macerated skin into the left plantar heel. We will change the primary dressing to alginate ooLeft dorsal foot has some improvement in overall wound area. Still using clindamycin and silver alginate ooRight dorsal foot about the same as the left using clindamycin and silver alginate ooThe erythema in the right leg has resolved.  He is DVT rule out was negative ooLeft heel pressure area required debridement although the wound is smaller and the surface is health 10/26/2018 ooThe patient came back in for his nurse check today predominantly because of the drainage coming out of the left lateral leg with a recent reopening of his original wound on the left lateral calf. He comes in today with a large amount of surrounding erythema around the wound extending from the calf into the ankle and even in the area on the dorsal foot. He is not systemically unwell. He is not febrile. Nevertheless this looks like cellulitis. We have been using silver alginate to the area. I changed him to a regular visit and I am going to prescribe him doxycycline. The rationale here is a long list of medication intolerances and a history of MRSA. I did not see anything that I thought would provide a valuable culture 10/30/2018 ooFollow-up from his appointment 4 days ago with really an extensive area of cellulitis in the left calf left lateral ankle and left dorsal foot. I put him on doxycycline. He has a long list of medication allergies which are true allergy reactions. 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 In any case he is a lot better today. The erythema especially in the anterior and lateral left calf is better. He still has left ankle erythema. He also is complaining about increasing edema in the right leg we have only been using Kerlix Coban and he has been doing the wraps at home. Finally he has a spotty rash on the medial part of his upper left calf which looks like folliculitis or perhaps wrap occlusion type injury. Small superficial macules not pustules 11/06/18 patient arrives today with again a considerable degree of erythema around the wound on the left lateral calf extending into the dorsal ankle and dorsal foot. This is a lot worse than when I saw this last week. He is on doxycycline really with not a lot of improvement. He has not been systemically unwell Wounds on the; left heel actually looks improved. Original area on the left foot and proximity to the first and second toes looks about the same. He has superficial areas on the dorsal foot, anterior calf and then the reopening of his original wound on the left lateral calf which looks about the same ooThe only area he has on the right is the dorsal webspace first and second which is smaller. ooHe has a large area of dry erythematous skin on the left buttock small open area here. 11/13/2018; the patient arrives in much better condition. The erythema around the wound on the left lateral calf is a lot better. Not sure whether this was the clindamycin or the TCA and ketoconazole or just in the improvement in edema control [stasis dermatitis]. In any case this is a lot better. The area on the left heel is very small and just about resolved using silver collagen we have been using silver alginate to the areas on his dorsal feet 11/20/2018; his wounds include the left lateral calf, left heel, dorsal aspects of both feet just proximal to the first second  webspace. He is stable to slightly improved. I did not think any changes to his dressings were going to be necessary 11/27/2018 he has a reopening on the left buttock which is surrounded by what looks like tinea or perhaps some other form of dermatitis. The area on the left  dorsal foot has some erythema around it I have marked this area but I am not sure whether this is cellulitis or not. Left heel is not closed. Left calf the reopening is really slightly longer and probably worse 1/13; in general things look better and smaller except for the left dorsal foot. Area on the left heel is just about closed, left buttock looks better only a small wound remains in the skin looks better [using Lotrisone] 1/20; the area on the left heel only has a few remaining open areas here. Left lateral calf about the same in terms of size, left dorsal foot slightly larger right lateral foot still not closed. The area on the left buttock has no open wound and the surrounding skin looks a lot better 1/27; the area on the left heel is closed. Left lateral calf better but still requiring extensive debridements. The area on his left buttock is closed. He still has the open areas on the left dorsal foot which is slightly smaller in the right foot which is slightly expanded. We have been using Iodoflex on these areas as well 2/3; left heel is closed. Left lateral calf still requiring debridement using Iodoflex there is no open area on his left buttock however he has dry scaly skin over a large area of this. Not really responding well to the Lotrisone. Finally the areas on his dorsal feet at the level of the first second webspace are slightly smaller on the right and about the same on the left. Both of these vigorously debrided with Anasept and gauze 2/10; left heel remains closed he has dry erythematous skin over the left buttock but there is no open wound here. Left lateral leg has come in and with. Still requiring debridement  we have been using Iodoflex here. Finally the area on the left dorsal foot and right dorsal foot are really about the same extremely dry callused fissured areas. He does not yet have a dermatology appointment 2/17; left heel remains closed. He has a new open area on the left buttock. The area on the left lateral calf is bigger longer and still covered in necrotic debris. No major change in his foot areas bilaterally. I am awaiting for a dermatologist to look on this. We have been using ketoconazole I do not know that this is been doing any good at all. 2/24; left heel remains closed. The left buttock wound that was new reopening last week looks better. The left lateral calf appears better also although still requires debridement. The major area on his foot is the left first second also requiring debridement. We have been putting Prisma on all wounds. I do not believe that the ketoconazole has done too much good for his feet. He will use Lotrisone I am going to give him a 2-week course of terbinafine. We still do not have a dermatology appointment 3/2 left heel remains closed however there is skin over bone in this area I pointed this out to him today. The left buttock wound is epithelialized but still does not look completely stable. The area on the left leg required debridement were using silver collagen here. With regards to his feet we changed to Lotrisone last week and silver alginate. 3/9; left heel remains closed. Left buttock remains closed. The area on the right foot is essentially closed. The left foot remains unchanged. Slightly smaller on the left lateral calf. Using silver collagen to both of these areas 3/16-Left heel remains closed. Area on right foot  is closed. Left lateral calf above the lateral malleolus open wound requiring debridement with easy bleeding. Left dorsal wound proximal to first toe also debrided. Left ischial area open new. Patient has been using Prisma with wrapping  every 3 days. Dermatology appointment is apparently tomorrow.Patient has completed his terbinafine 2-week course with some apparent improvement according to him, there is still flaking and dry skin in his foot on the left 3/23; area on the right foot is reopened. The area on the left anterior foot is about the same still a very necrotic adherent surface. He still has the area on the left leg and reopening is on the left buttock. He apparently saw dermatology although I do not have a note. According to the patient who is usually fairly well informed they did not have any good ideas. Put him on oral terbinafine which she is been on before. 3/30; using silver collagen to all wounds. Apparently his dermatologist put him on doxycycline and rifampin presumably some culture grew staph. I do not have this result. He remains on terbinafine although I have used terbinafine on him before 4/6; patient has had a fairly substantial reopening on the right foot between the first and second toes. He is finished his terbinafine and I believe is on doxycycline and rifampin still as prescribed by dermatology. We have been using silver collagen to all his wounds although the patient reports that he thinks silver alginate does better on the wounds on his buttock. 4/13; the area on his left lateral calf about the same size but it did not require debridement. ooLeft dorsal foot just proximal to the webspace between the first and second toes is about the same. Still nonviable surface. I note some superficial bronze discoloration of the dorsal part of his foot ooRight dorsal foot just proximal to the first and second toes also looks about the same. I still think there may be the same discoloration I noted above on the left ooLeft buttock wound looks about the same 4/20; left lateral calf appears to be gradually contracting using silver collagen. 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 The left dorsal foot wound is debrided of tightly adherent necrotic debris and really cleans up quite nicely. The right area is worse with expansion. I did not debride this it is now over the base of the second toe ooThe area on his left buttock is smaller no debridement is required using silver collagen 5/4; left calf continues to make good progress. ooHe arrives with erythema around the wounds on his dorsal foot which even extends to the plantar aspect. Very concerning for coexistent infection. He is finished the erythromycin I gave him for possible erythrasma this does not seem to have helped. ooThe area on the left foot is about the same base of the dorsal toes ooIs area on the buttock looks improved on the left 5/11; left calf and left buttock continued to make good progress. Left foot is about the same to slightly improved. ooMajor problem is on the right foot. He has not had an x-ray. Deep tissue culture I did last week showed both Enterobacter and E. coli. I did not change the doxycycline I put him on empirically although neither 1 of these were plated to doxycycline. He arrives today with the erythema looking worse on both the dorsal and plantar foot. Macerated skin on the bottom of the foot. he has not been systemically unwell 5/18-Patient returns at 1 week, left  calf wound appears to be making some progress, left buttock wound appears slightly worse than last time, left foot wound looks slightly better, right foot redness is marginally better. X-ray of both feet show no air or evidence of osteomyelitis. Patient is finished his Omnicef and terbinafine. He continues to have macerated skin on the bottom of the left foot as well as right 5/26; left calf wound is better, left buttock wound appears to have multiple small superficial open areas with surrounding macerated skin. X-rays that I did last time showed no evidence of osteomyelitis in either foot. He is  finished cefdinir and doxycycline. I do not think that he was on terbinafine. He continues to have a large superficial open area on the right foot anterior dorsal and slightly between the first and second toes. I did send him to dermatology 2 months ago or so wondering about whether they would do a fungal scraping. I do not believe they did but did do a culture. We have been using silver alginate to the toe areas, he has been using antifungals at home topically either ketoconazole or Lotrisone. We are using silver collagen on the left foot, silver alginate on the right, silver collagen on the left lateral leg and silver alginate on the left buttock 6/1; left buttock area is healed. We have the left dorsal foot, left lateral leg and right dorsal foot. We are using silver alginate to the areas on both feet and silver collagen to the area on his left lateral calf 6/8; the left buttock apparently reopened late last week. He is not really sure how this happened. He is tolerating the terbinafine. Using silver alginate to all wounds 6/15; left buttock wound is larger than last week but still superficial. ooCame in the clinic today with a report of purulence from the left lateral leg I did not identify any infection ooBoth areas on his dorsal feet appear to be better. He is tolerating the terbinafine. Using silver alginate to all wounds 6/22; left buttock is about the same this week, left calf quite a bit better. His left foot is about the same however he comes in with erythema and warmth in the right forefoot once again. Culture that I gave him in the beginning of May showed Enterobacter and E. coli. I gave him doxycycline and things seem to improve although neither 1 of these organisms was specifically plated. 6/29; left buttock is larger and dry this week. Left lateral calf looks to me to be improved. Left dorsal foot also somewhat improved right foot completely unchanged. The erythema on the right foot  is still present. He is completing the Ceftin dinner that I gave him empirically [see discussion above.) 7/6 - All wounds look to be stable and perhaps improved, the left buttock wound is slightly smaller, per patient bleeds easily, completed ceftin, the right foot redness is less, he is on terbinafine 7/13; left buttock wound about the same perhaps slightly narrower. Area on the left lateral leg continues to narrow. Left dorsal foot slightly smaller right foot about the same. We are using silver alginate on the right foot and Hydrofera Blue to the areas on the left. Unna boot on the left 2 layer compression on the right 7/20; left buttock wound absolutely the same. Area on lateral leg continues to get better. Left dorsal foot require debridement as did the right no major change in the 7/27; left buttock wound the same size necrotic debris over the surface. The area on the  lateral leg is closed once again. His left foot looks better right foot about the same although there is some involvement now of the posterior first second toe area. He is still on terbinafine which I have given him for a month, not certain a centimeter major change 06/25/19-All wounds appear to be slightly improved according to report, left buttock wound looks clean, both foot wounds have minimal to no debris the right dorsal foot has minimal slough. We are using Hydrofera Blue to the left and silver alginate to the right foot and ischial wound. 8/10-Wounds all appear to be around the same, the right forefoot distal part has some redness which was not there before, however the wound looks clean and small. Ischial wound looks about the same with no changes 8/17; his wound on the left lateral calf which was his original chronic venous insufficiency wound remains closed. Since I last saw him the areas on the left dorsal foot right dorsal foot generally appear better but require debridement. The area on his left initial tuberosity  appears somewhat larger to me perhaps hyper granulated and bleeds very easily. We have been using Hydrofera Blue to the left dorsal foot and silver alginate to everything else 8/24; left lateral calf remains closed. The areas on his dorsal feet on the webspace of the first and second toes bilaterally both look better. The area on the left buttock which is the pressure ulcer stage II slightly smaller. I change the dressing to Hydrofera Blue to all areas 8/31; left lateral calf remains closed. The area on his dorsal feet bilaterally look better. Using Hydrofera Blue. Still requiring debridement on the left foot. No change in the left buttock pressure ulcers however 9/14; left lateral calf remains closed. Dorsal feet look quite a bit better than 2 weeks ago. Flaking dry skin also a lot better with the ammonium lactate I gave him 2 weeks ago. The area on the left buttock is improved. He states that his Roho cushion developed a leak and he is getting a new one, in the interim he is offloading this vigorously 9/21; left calf remains closed. Left heel which was a possible DTI looks better this week. He had macerated tissue around the left dorsal foot right foot looks satisfactory and improved left buttock wound. I changed his dressings to his feet to silver alginate bilaterally. Continuing Hydrofera Blue on the left buttock. 9/28 left calf remains closed. Left heel did not develop anything [possible DTI] dry flaking skin on the left dorsal foot. Right foot looks satisfactory. Improved left buttock wound. We are using silver alginate on his feet Hydrofera Blue on the buttock. I have asked him to go back to the Lotrisone on his feet including the wounds and surrounding areas 10/5; left calf remains closed. The areas on the left and right feet about the same. A lot of this is epithelialized however debris over the remaining open areas. He is using Lotrisone and silver alginate. The area on the left buttock  using Hydrofera Blue 10/26. Patient has been out for 3 weeks secondary to Covid concerns. He tested negative but I think his wife tested positive. He comes in today with the left foot substantially worse, right foot about the same. Even more concerning he states that the area on his left buttock closed over but then reopened and is considerably deeper in one aspect than it was before [stage III wound] 11/2; left foot really about the same as last week. Quarter sized wound on  the dorsal foot just proximal to the first second toes. Surrounding erythema with areas of denuded epithelium. This is not really much different looking. Did not look like cellulitis this time however. ooRight foot area about the same.. We have been using silver alginate alginate on his toes ooLeft buttock still substantial irritated skin around the wound which I think looks somewhat better. We have been using Hydrofera Blue here. 11/9; left foot larger than last week and a very necrotic surface. Right foot I think is about the same perhaps slightly smaller. Debris around the circumference also addressed. Unfortunately on the left buttock there is been a decline. Satellite lesions below the major wound distally and now a an additional one posteriorly we have been using Hydrofera Blue but I think this is a pressure issue 11/16; left foot ulcer dorsally again a very adherent necrotic surface. Right foot is about the same. Not much change in the pressure ulcer on his left buttock. 11/30; left foot ulcer dorsally basically the same as when I saw him 2 weeks ago. Very adherent fibrinous debris on the wound surface. Patient reports a lot of drainage as well. 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 We have been using Iodoflex, patient changed back to alginate because of the drainage. Area on his right dorsal foot really looks benign with a healthier surface certainly a lot better than  on the left. Left buttock wounds all improved using Hydrofera Blue 12/7; left dorsal foot again no improvement. Tightly adherent debris. PCR culture I did last week only showed likely skin contaminant. I have gone ahead and done a punch biopsy of this which is about the last thing in terms of investigations I can think to do. He has known venous insufficiency and venous hypertension and this could be the issue here. The area on the right foot is about the same left buttock slightly worse according to our intake nurse secondary to [facility] Blue sticking to the wound 12/14; biopsy of the left foot that I did last time showed changes that could be related to wound healing/chronic stasis dermatitis phenomenon no neoplasm. We have been using silver alginate to both feet. I change the one on the left today to Sorbact and silver alginate to his other 2 wounds 12/28; the patient arrives with the following problems; ooMajor issue is the dorsal left foot which continues to be a larger deeper wound area. Still with a completely nonviable surface ooParadoxically the area mirror image on the right on the right dorsal foot appears to be getting better. ooHe had some loss of dry denuded skin from the lower part of his original wound on the left lateral calf. Some of this area looked a little vulnerable and for this reason we put him in wrap that on this side this week ooThe area on his left buttock is larger. He still has the erythematous circular area which I think is a combination of pressure, sweat. This does not look like cellulitis or fungal dermatitis 11/26/2019; -Dorsal left foot large open wound with depth. Still debris over the surface. Using Sorbact ooThe area on the dorsal right foot paradoxically has closed over [facility] has a reopening on the left ankle laterally at the base of his original wound that extended up into the calf. This appears clean. ooThe left buttock wound is smaller but with very  adherent necrotic debris over the surface. We have been using silver alginate here as well The patient  had arterial studies done in 2017. He had biphasic waveforms at the dorsalis pedis and posterior tibial bilaterally. ABI in the left was 1.17. Digit waveforms were dampened. He has slight spasticity in the great toes I do not think a TBI would be possible 1/11; the patient comes in today with a sizable reopening between the first and second toes on the right. This is not exactly in the same location where we have been treating wounds previously. According to our intake nurse this was actually fairly deep but 0.6 cm. The area on the left dorsal foot looks about the same the surface is somewhat cleaner using Sorbact, his MRI is in 2 days. We have not managed yet to get arterial studies. The new reopening on the left lateral calf looks somewhat better using alginate. The left buttock wound is about the same using alginate 1/18; the patient had his ARTERIAL studies which were quite normal. ABI in the right at 1.13 with triphasic/biphasic waveforms on the left ABI 1.06 again with triphasic/biphasic waveforms. It would not have been possible to have done a toe brachial index because of spasticity. We have been using Sorbac to the left foot alginate to the rest of his wounds on the right foot left lateral calf and left buttock 1/25; arrives in clinic with erythema and swelling of the left forefoot worse over the first MTP area. This extends laterally dorsally and but also posteriorly. Still has an area on the left lateral part of the lower part of his calf wound it is eschared and clearly not closed. ooArea on the left buttock still with surrounding irritation and erythema. ooRight foot surface wound dorsally. The area between the right and first and second toes appears better. 2/1; ooThe left foot wound is about the same. Erythema slightly better I gave him a week of doxycycline empirically ooRight  foot wound is more extensive extending between the toes to the plantar surface ooLeft lateral calf really no open surface on the inferior part of his original wound however the entire area still looks vulnerable ooAbsolutely no improvement in the left buttock wound required debridement. 2/8; the left foot is about the same. Erythema is slightly improved I gave him clindamycin last week. ooRight foot looks better he is using Lotrimin and silver alginate ooHe has a breakdown in the left lateral calf. Denuded epithelium which I have removed ooLeft buttock about the same were using Hydrofera Blue 2/15; left foot is about the same there is less surrounding erythema. Surface still has tightly adherent debris which I have debriding however not making any progress ooRight foot has a substantial wound on the medial right second toe between the first and second webspace. ooStill an open area on the left lateral calf distal area. ooButtock wound is about the same 2/22; left foot is about the same less surrounding erythema. Surface has adherent debris. Polymen Ag Right foot area significant wound between the first and second toes. We have been using silver alginate here Left lateral leg polymen Ag at the base of his original venous insufficiency wound ooLeft buttock some improvement here 3/1; ooRight foot is deteriorating in the first second toe webspace. Larger and more substantial. We have been using silver alginate. ooLeft dorsal foot about the same markedly adherent surface debris using PolyMem Ag ooLeft lateral calf surface debris using PolyMem AG ooLeft buttock is improved again using PolyMem Ag. ooHe is completing his terbinafine. The erythema in the foot seems better. He has been on this for  2 weeks 3/8; no improvement in any wound area in fact he has a small open area on the dorsal midfoot which is new this week. He has not gotten his foot x-rays yet 3/15; his x-rays were both  negative for osteomyelitis of both feet. No major change in any of his wounds on the extremities however his buttock wounds are better. We have been using polymen on the buttocks, left lower leg. Iodoflex on the left foot and silver alginate on the right 3/22; arrives in clinic today with the 2 major issues are the improvement in the left dorsal foot wound which for once actually looks healthy with a nice healthy wound surface without debridement. Using Iodoflex here. Unfortunately on the left lateral calf which is in the distal part of his original wound he came to the clinic here for there was purulent drainage noted some increased breakdown scattered around the original area and a small area proximally. We we are using polymen here will change to silver alginate today. His buttock wound on the left is better and I think the area on the right first second toe webspace is also improved 3/29; left dorsal foot looks better. Using Iodoflex. Left ankle culture from deterioration last time grew E. coli, Enterobacter and Enterococcus. I will give him a course of cefdinir although that will not cover Enterococcus. The area on the right foot in the webspace of the first and second toe lateral first toe looks better. The area on his buttock is about healed Vascular appointment is on April 21. This is to look at his venous system vis--vis continued breakdown of the wounds on the left including the left lateral leg and left dorsal foot he. He has had previous ablations on this side 4/5; the area between the right first and second toes lateral aspect of the first toe looks better. Dorsal aspect of the left first toe on the left foot also improved. Unfortunately the left lateral lower leg is larger and there is a second satellite wound superiorly. The usual superficial abrasions on the left buttock overall better but certainly not closed 4/12; the area between the right first and second toes is improved. Dorsal  aspect of the left foot also slightly smaller with a vibrant healthy looking surface. No real change in the left lateral leg and the left buttock wound is healed He has an unaffordable co-pay for Apligraf. Appointment with vein and vascular with regards to the left leg venous part of the circulation is on 4/21 4/19; we continue to see improvement in all wound areas. Although this is minor. He has his vascular appointment on 4/21. The area on the left buttock has not reopened although right in the center of this area the skin looks somewhat threatened 4/26; the left buttock is unfortunately reopened. In general his left dorsal foot has a healthy surface and looks somewhat smaller although it was not measured as such. The area between his first and second toe webspace on the right as a small wound against the first toe. The patient saw vascular surgery. The real question I was asking was about the small saphenous vein on the left. He has previously ablated left greater saphenous vein. Nothing further was commented on on the left. Right greater saphenous vein without reflux at the saphenofemoral junction or proximal thigh there was no indication for ablation of the right greater saphenous vein duplex was negative for DVT bilaterally. They did not think there was anything from a vascular surgery  point of view that could be offered. They ABIs within normal limits 5/3; only small open area on the left buttock. The area on the left lateral leg which was his original venous reflux is now 2 wounds both which look clean. We are using Iodoflex on the left dorsal foot which looks healthy and smaller. He is down to a very tiny area between the right first and second toes, using silver alginate 5/10; all of his wounds appear better. We have much better edema control in 4 layer compression on the left. This may be the factor that is allowing the left foot and left lateral calf to heal. He has external compression  garments at home 04/14/20-All of his wounds are progressing well, the left forefoot is practically closed, left ischium appears to be about the same, right toe webspace is also smaller. The left lateral leg is about the same, continue using Hydrofera Blue to this, silver alginate to the ischium, Iodoflex to the toe space on the right 6/7; most of his wounds outside of the left buttock are doing well. The area on the left lateral calf and left dorsal foot are smaller. The area on the right foot in between the first and second toe webspace is barely visible although he still says there is some drainage here is the only reason I did not heal this out. ooUnfortunately the area on the left buttock almost looks like he has a skin tear from tape. He has open wound and then a large flap of skin that we are trying to get adherence over an area just next to the remaining wound 6/21; 2 week follow-up. I believe is been here for nurse visits. Miraculously the area between his first and second toes on the left dorsal foot is closed over. Still open on the right first second web space. The left lateral calf has 2 open areas. Distally this is more superficial. The proximal area had a little more depth and required debridement of adherent necrotic material. His buttock wound is actually larger we have been using silver alginate here 6/28; the patient's area on the left foot remains closed. Still open wet area between the first and second toes on the right and also extending into the plantar aspect. We have been using silver alginate in this location. He has 2 areas on the left lower leg part of his original long wounds which I think are better. We have been using Hydrofera Blue here. Hydrofera Blue to the left buttock which is stable 7/12; left foot remains closed. Left ankle is closed. May be a small area between his right first and second toes the only truly open area is on the left buttock. We have been using  Hydrofera Blue here 7/19; patient arrives with marked deterioration especially in the left foot and ankle. We did not put him in a compression wrap on the left last week in fact he wore his juxta lite stockings on either side although he does not have an underlying stocking. He has a reopening on the left dorsal foot, left lateral ankle and a new area on the right dorsal ankle. More worrisome is the degree of erythema on the left foot extending on the lateral foot into the lateral lower leg on the left 7/26; the patient had erythema and drainage from the lateral left ankle last week. Culture of this grew MRSA resistant to doxycycline and clindamycin which are the 2 antibiotics we usually use with this patient who has  multiple antibiotic allergies including linezolid, trimethoprim sulfamethoxazole. I had give him an empiric doxycycline and he comes in the area certainly looks somewhat better although it is blotchy in his lower leg. He has not been systemically unwell. He has had areas on the left dorsal foot which is a reopening, chronic wounds on the left lateral ankle. Both of these I think are secondary to chronic venous insufficiency. The area between his first and second toes is closed as far as I can tell. He had a new wrap injury on the right dorsal ankle last week. Finally he has an area on the left buttock. We have been using silver alginate to everything except the left buttock we are using Hydrofera Blue 06/30/20-Patient returns at 1 week, has been given a sample dose pack of NUZYRA which is a tetracycline derivative [omadacycline], patient has completed those, we have been using silver alginate to almost all the wounds except the left ischium where we are using Hydrofera Blue all of them look better 8/16; since I last saw the patient he has been doing well. The area on the left buttock, left lateral ankle and left foot are all closed today. He has completed the [place] I gave him last time and  tolerated this well. He still has open areas on the right dorsal ankle and in the right first second toe area which we are using silver alginate. 8/23; we put him in his bilateral external compression stockings last week as he did not have anything open on either leg except for concerning area between the right first and second toe. He comes in today with an area on the left dorsal foot slightly more proximal than the original wound, the left lateral foot but this is actually a continuation of the area he had on the left lateral ankle from last time. As well he is opened up on the left buttock again. 8/30; comes in today with things looking a lot better. The area on the left lower ankle has closed down as has the left foot but with eschar in both areas. The area on the dorsal right ankle is also epithelialized. Very little remaining of the left buttock wound. We have been using silver alginate on all wound areas 9/13; the area in the first second toe webspace on the right has fully epithelialized. He still has some vulnerable epithelium on the right and the ankle and the dorsal foot. He notes weeping. He is using his juxta lite stocking. On the left again the left dorsal foot is closed left lateral ankle is closed. We went to the juxta lite stocking here as well. ooStill vulnerable in the left buttock although only 2 small open areas remain here 9/27; 2-week follow-up. We did not look at his left leg but the patient says everything is closed. He is a bit disturbed by the amount of edema in his left foot he is using juxta lite stockings but asking about over the toes stockings which would be 30/40, will talk to him next time. According to him there is no open wound on either the left foot or the left ankle/calf He has an open area on the dorsal right calf which I initially point a wrap injury. He has superficial remaining wound on the left ischial tuberosity been using silver alginate although he says  this sticks to the wound 10/5; we gave him 2-week follow-up but he called yesterday expressing some concerns about his right foot right ankle and the left  buttock. He came in early. There is still no open areas on the left leg and that still in his juxta lite stocking 10/11; he only has 1 small area on the left buttock that remains measuring millimeters 1 mm. Still has the same irritated skin in this area. We recommended zinc oxide when this eventually closes and pressure relief is meticulously is he can do this. He still has an area on the dorsal part of his right first through third toes which is a bit irritated and still open and on the dorsal ankle near the crease of the ankle. We have been using silver alginate and using his own stocking. He has nothing open on the left leg or foot 10/25; 2-week follow-up. Not nearly as good on the left buttock as I was hoping. For open areas with 5 looking threatened small. He has the erythematous irritated chronic skin in this area. oo1 area on the right dorsal ankle. He reports this area bleeds easily ooRight dorsal foot just proximal to the base of his toes ooWe have been using silver alginate. 11/8; 2-week follow-up. Left buttock is about the same although I do not think the wounds are in the same location we have been using silver alginate. I have asked him to use zinc oxide on the skin around the wounds. ooHe still has a small area on the right dorsal ankle he reports this bleeds easily ooRight dorsal foot just proximal to the base of the toes does not have anything open although the skin is very dry and scaly ooHe has a new opening on the nailbed of the left great toe. Nothing on the left ankle 11/29; 3-week follow-up. Left buttock has 2 open areas. And washing of these wounds today started bleeding easily. Suggesting very friable tissue. We have been using silver alginate. Right dorsal ankle which I thought was initially a wrap injury we have  been using silver alginate. Nothing open between the toes that I can see. He states the area on the left dorsal toe nailbed healed after the last visit in 2 or 3 days 12/13; 3-week follow-up. His left buttock now has 3 open areas but the original 2 areas are smaller using polymen here. Surrounding skin looks better. The right dorsal ankle is closed. He has a small opening on the right dorsal foot at the level of the third toe. In general the skin looks better here. He is wearing his juxta lite stocking on the left leg says there is nothing open 11/24/2020; 3 weeks follow-up. His left buttock still has the 3 open areas. We have been using polymen but due to lack of response he changed to [facility] area. Surrounding skin is dry erythematous and irritated looking. There is no evidence of infection either bacterial or fungal however there is loss of surface epithelium ooHe still has very dry skin in his foot causing irritation and erythema on the dorsal part of his toes. This is not responded to prolonged courses of antifungal simply looks dry and irritated Objective Constitutional Vitals Time Taken: 7:58 AM, Height: 70 in, Weight: 216 lbs, BMI: 31, Temperature: 98.1 F, Pulse: 88 bpm, Respiratory Rate: 18 breaths/min, Blood Pressure: 133/82 mmHg. Integumentary (Hair, Skin) Wound #38R status is Open. Original cause of wound was Gradually Appeared. The wound is located on the Right T - Web between 1st and 2nd. The wound oe measures 0.5cm length x 0.3cm width x 0.3cm depth; 0.118cm^2 area and 0.035cm^3 volume. There is Fat Layer (Subcutaneous  Tissue) exposed. There is no tunneling or undermining noted. There is a small amount of serous drainage noted. The wound margin is flat and intact. There is medium (34-66%) pink granulation within the wound bed. There is a medium (34-66%) amount of necrotic tissue within the wound bed including Adherent Slough. Wound #41R status is Open. Original cause of  wound was Gradually Appeared. The wound is located on the Left Ischium. The wound measures 4cm length x 3.5cm width x 0.1cm depth; 10.996cm^2 area and 1.1cm^3 volume. There is Fat Layer (Subcutaneous Tissue) exposed. There is no tunneling or undermining noted. There is a medium amount of serosanguineous drainage noted. The wound margin is distinct with the outline attached to the wound base. There is large (67-100%) red, friable granulation within the wound bed. There is a small (1-33%) amount of necrotic tissue within the wound bed including Adherent Slough. Wound #47 status is Open. Original cause of wound was Gradually Appeared. The wound is located on the Right,Dorsal Foot. The wound measures 1cm length x 3.4cm width x 0.1cm depth; 2.67cm^2 area and 0.267cm^3 volume. There is no tunneling or undermining noted. There is a medium amount of serosanguineous drainage noted. The wound margin is distinct with the outline attached to the wound base. There is medium (34-66%) red, pink granulation within the wound bed. There is a medium (34-66%) amount of necrotic tissue within the wound bed including Adherent Slough. 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 Bathing/ Shower/ Hygiene: May shower and wash wound with soap and water. - on days that dressing is changed Edema Control - Lymphedema / SCD / Other: Elevate legs to the level of the heart or above for 30 minutes daily and/or when sitting,  a frequency of: - throughout the day Compression stocking or Garment 30-40 mm/Hg pressure to: - Juxtalite to both legs daily Off-Loading: Roho cushion for wheelchair Turn and reposition every 2 hours WOUND #38R: - T - Web between 1st and 2nd Wound Laterality: Right oe Cleanser: Soap and Water Every Other Day/30 Days Discharge Instructions: May shower and wash wound with dial antibacterial soap and water prior to dressing change. Peri-Wound Care: Sween Lotion (Moisturizing lotion) Every Other Day/30 Days Discharge Instructions: Apply moisturizing lotion as directed Prim Dressing: KerraCel Ag Gelling Fiber Dressing, 2x2 in (silver alginate) Every Other Day/30 Days ary Discharge Instructions: Apply silver alginate to wound bed as instructed Secondary Dressing: Woven Gauze Sponge, Non-Sterile 4x4 in Every Other Day/30 Days Discharge Instructions: Apply over primary dressing as directed. Secured With: [employer], 4.5x3.1 (in/yd) Every Other Day/30 Days Discharge Instructions: Secure with Kerlix as directed. WOUND #41R: - Ischium Wound Laterality: Left Cleanser: Soap and Water Every Other Day/30 Days Discharge Instructions: May shower and wash wound with dial antibacterial soap and water prior to dressing change. Peri-Wound Care: Triad Hydrophilic Wound Dressing Tube, 6 (oz) (DME) (Generic) Every Other Day/30 Days Discharge Instructions: Apply to periwound with each dressing change Peri-Wound Care: Triad Hydrophilic Paste Every Other Day/30 Days Discharge Instructions: Apply to periwound with each dressing change Prim Dressing: Hydrofera  Blue Ready Foam, 2.5 x2.5 in Every Other Day/30 Days ary Discharge Instructions: Apply to wound bed as instructed Secondary Dressing: ComfortFoam Border, 4x4 in (silicone border) Every Other Day/30 Days Discharge Instructions: Apply over primary dressing as directed. WOUND #47: - Foot Wound Laterality: Dorsal, Right Cleanser: Soap and Water Every  Other Day/30 Days Discharge Instructions: May shower and wash wound with dial antibacterial soap and water prior to dressing change. Prim Dressing: KerraCel Ag Gelling Fiber Dressing, 2x2 in (silver alginate) Every Other Day/30 Days ary Discharge Instructions: Apply silver alginate to wound bed as instructed Secondary Dressing: Woven Gauze Sponge, Non-Sterile 4x4 in Every Other Day/30 Days Discharge Instructions: Apply over primary dressing as directed. Secured With: [employer], 4.5x3.1 (in/yd) (DME) (Generic) Every Other Day/30 Days Discharge Instructions: Secure with Kerlix as directed. 1. I am going to try Triad hydrophilic wound dressing to the dry skin around the buttock wounds. This is not responded to usual barrier types zinc oxide type creams. Still Hydrofera Blue to the wounds 2. On the right foot we are using silver alginate. I do not know that we are going to be able to get this much better 3. Nothing open on the problematic left foot and left leg per the patient. We did not look at this today Electronic Signature(s) Signed: 11/24/2020 3:00:47 PM By: Linton Ham MD Entered By: Linton Ham on 11/24/2020 08:51:29 -------------------------------------------------------------------------------- [place] Details Patient Name: Date of Service: Kasparek, [name] 11/24/2020 Medical Record Number: [id number] Patient Account Number: [id number] Date of Birth/Sex: Treating RN: 02-Nov-1986 (35 y.o. [name] Primary Care Provider: [place], [place] [facility] Other Clinician: Referring Provider: Treating Provider/Extender: [name]: 255 Diagnosis Coding ICD-10 Codes Code Description I87.332 Chronic venous hypertension (idiopathic) with ulcer and inflammation of left lower extremity L97.321 Non-pressure chronic ulcer of left ankle limited to breakdown of skin L97.311 Non-pressure chronic ulcer of right ankle limited to breakdown of skin L97.511  Non-pressure chronic ulcer of other part of right foot limited to breakdown of skin L97.521 Non-pressure chronic ulcer of other part of left foot limited to breakdown of 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, 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, complete Facility Procedures CPT4 Code: [id number] Description: 99214 - WOUND CARE VISIT-LEV 4 EST PT Modifier: Quantity: 1 Physician Procedures : CPT4 Code Description Modifier [id number] - WC PHYS LEVEL 3 - EST PT ICD-10 Diagnosis Description L97.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, stage 3 Quantity: 1 Electronic Signature(s) Signed: 11/24/2020 3:00:47 PM By: Linton Ham MD Signed: 11/25/2020 5:50:13 PM By: Levan Hurst RN, BSN Entered By: Levan Hurst on 11/24/2020 09:32:26

## 2020-11-25 NOTE — Progress Notes (Signed)
[name], [name] ([id number]) Visit Report for 11/24/2020 Allergy List Details Patient Name: Date of Service: [name], [name] 11/24/2020 7:30 [name] Medical Record Number: [id number] Patient Account Number: [id number] Date of Birth/Sex: Treating RN: 12-Oct-1986 (35 y.o. [name] Primary Care [name]: [name] Other Clinician: Referring [name]: Treating [name]/Extender: [name], [name] in Treatment: 255 Allergies Active Allergies penicillin Reaction: rash Severity: Moderate Type: Food Sulfa (Sulfonamide Antibiotics) Reaction: rash Severity: Moderate Type: Allergen Levaquin Reaction: rash Severity: Moderate Type: Medication meropenem Reaction: rash Type: Food Zyvox Reaction: rash Type: Medication Allergy Notes Electronic Signature(s) Signed: 11/25/2020 5:50:13 PM By: Levan Hurst RN, BSN Entered By: Levan Hurst on 11/24/2020 09:26:45 -------------------------------------------------------------------------------- Arrival Information Details Patient Name: Date of Service: [name], [name] 11/24/2020 7:30 [name] Medical Record Number: [id number] Patient Account Number: [id number] Date of Birth/Sex: Treating RN: 03-20-1986 (35 y.o. [name] Primary Care Sulay Brymer: [name] Other Clinician: Referring Killian Ress: Treating [name]/Extender: [name] [name] in Treatment: 30 Visit Information Patient Arrived: Wheel Chair Arrival Time: 07:56 Accompanied By: self Transfer Assistance: None Patient Identification Verified: Yes Secondary Verification Process Completed: Yes Patient Requires Transmission-Based Precautions: No Patient Has Alerts: Yes Patient Alerts: R ABI = 1. L ABI = 1. History Since Last Visit Added or deleted any medications: No Any new allergies or adverse reactions: No Had [name] fall or experienced change in activities of daily living that may affect risk of falls: No risk of falls: Signs or  symptoms of abuse/neglect since last visito No Hospitalized since last visit: No Implantable device outside of the clinic excluding cellular tissue based products placed in the center 01 Has Dressing in Place as Prescribed: Yes Pain Present Now: No Electronic Signature(s) Signed: 11/25/2020 11:46:23 AM By: [name] Entered By: [name] on 11/24/2020 07:58:07 -------------------------------------------------------------------------------- Clinic Level of Care Assessment Details Patient Name: Date of Service: [name], [name] 11/24/2020 7:30 [name] Medical Record Number: [id number] Patient Account Number: [id number] Date of Birth/Sex: Treating RN: 07-Jan-1986 (35 y.o. [name] Primary Care [name]: [name] Other Clinician: Referring [name]: Treating [name]/Extender: [name] [name] in Treatment: 64 Clinic Level of Care Assessment Items TOOL 4 Quantity Score X- 1 0 Use when only an EandM is performed on FOLLOW-UP visit ASSESSMENTS - Nursing Assessment / Reassessment X- 1 10 Reassessment of Co-morbidities (includes updates in patient status) X- 1 5 Reassessment of Adherence to Treatment Plan ASSESSMENTS - Wound and Skin [name] ssessment / Reassessment []  - 0 Simple Wound Assessment / Reassessment - one wound X- 3 5 Complex Wound Assessment / Reassessment - multiple wounds []  - 0 Dermatologic / Skin Assessment (not related to wound area) ASSESSMENTS - Focused Assessment []  - 0 Circumferential Edema Measurements - multi extremities []  - 0 Nutritional Assessment / Counseling / Intervention X- 1 5 Lower Extremity Assessment (monofilament, tuning fork, pulses) []  - 0 Peripheral Arterial Disease Assessment (using hand held doppler) ASSESSMENTS - Ostomy and/or Continence Assessment and Care []  - 0 Incontinence Assessment and Management []  - 0 Ostomy Care Assessment and Management (repouching, etc.) PROCESS - Coordination of Care X -  Simple Patient / Family Education for ongoing care 1 15 []  - 0 Complex (extensive) Patient / Family Education for ongoing care X- 1 10 Staff obtains Programmer, systems, Records, T Results / Process Orders est []  - 0 Staff telephones HHA, Nursing Homes / Clarify orders / etc []  - 0 Routine Transfer to another Facility (non-emergent condition) []  - 0 Routine  Hospital Admission (non-emergent condition) []  - 0 New Admissions / / Ordering NPWT Apligraf, etc. , []  - 0 Emergency Hospital Admission (emergent condition) X- 1 10 Simple Discharge Coordination []  - 0 Complex (extensive) Discharge Coordination PROCESS - Special Needs []  - 0 Pediatric / Minor Patient Management []  - 0 Isolation Patient Management []  - 0 Hearing / Language / Visual special needs []  - 0 Assessment of Community assistance (transportation, D/C planning, etc.) []  - 0 Additional assistance / Altered mentation []  - 0 Support Surface(s) Assessment (bed, cushion, seat, etc.) INTERVENTIONS - Wound Cleansing / Measurement []  - 0 Simple Wound Cleansing - one wound X- 3 5 Complex Wound Cleansing - multiple wounds X- 1 5 Wound Imaging (photographs - any number of wounds) []  - 0 Wound Tracing (instead of photographs) []  - 0 Simple Wound Measurement - one wound X- 3 5 Complex Wound Measurement - multiple wounds INTERVENTIONS - Wound Dressings X - Small Wound Dressing one or multiple wounds 3 10 []  - 0 Medium Wound Dressing one or multiple wounds []  - 0 Large Wound Dressing one or multiple wounds []  - 0 Application of Medications - topical []  - 0 Application of Medications - injection INTERVENTIONS - Miscellaneous []  - 0 External ear exam []  - 0 Specimen Collection (cultures, biopsies, blood, body fluids, etc.) []  - 0 Specimen(s) / Culture(s) sent or taken to Lab for analysis []  - 0 Patient Transfer (multiple staff / Manufacturing engineer / Similar devices) []  - 0 Simple Staple / Suture  removal (25 or less) []  - 0 Complex Staple / Suture removal (26 or more) []  - 0 Hypo / Hyperglycemic Management (close monitor of Blood Glucose) []  - 0 Ankle / Brachial Index (ABI) - do not check if billed separately X- 1 5 Vital Signs Has the patient been seen at the hospital within the last three years: Yes Total Score: 140 Level Of Care: New/Established - Level 4 Electronic Signature(s) Signed: 11/25/2020 5:50:13 PM By: RN, BSN Entered By: on 11/24/2020 09:32:13 -------------------------------------------------------------------------------- Encounter Discharge Information Details Patient Name: Date of Service: [name], [name] 11/24/2020 7:30 [name] Medical Record Number: Patient Account Number: Date of Birth/Sex: Treating RN: 12/25/1985 (34 y.o. Primary Care Merritt Mccravy: [name] Other Clinician: Referring Viktorya Arguijo: Treating Maeleigh Buschman/Extender: [name] in Treatment: 255 Encounter Discharge Information Items Discharge Condition: Stable Ambulatory Status: Wheelchair Discharge Destination: Home Transportation: Private Auto Accompanied By: self Schedule Follow-up Appointment: Yes Clinical Summary of Care: Patient Declined Electronic Signature(s) Signed: 11/24/2020 2:31:34 PM By: RN, BSN Entered By: on 11/24/2020 08:59:22 -------------------------------------------------------------------------------- Lower Extremity Assessment Details Patient Name: Date of Service: [name], [name] 11/24/2020 7:30 [name] Medical Record Number: Patient Account Number: Date of Birth/Sex: Treating RN: 1986-03-19 (34 y.o. Primary Care Ileigh Mettler: [name] Other Clinician: Referring Geanie Pacifico: Treating Haelee Bolen/Extender: Nurse, adult [name] in Treatment: 255 Edema Assessment Assessed: [Left: No] [Right:  No] Edema: [Left: Ye] [Right: s] Calf Left: Right: Point of Measurement: 33 cm From Medial Instep 33 cm Ankle Left: Right: Point of Measurement: 10 cm From Medial Instep 23 cm Vascular Assessment Pulses: Dorsalis Pedis Palpable: [Right:Yes] Doppler Audible: [Right:Yes] Posterior Tibial Palpable: [Right:Yes] Electronic Signature(s) Signed: 11/25/2020 5:38:12 PM By: RN Entered By: on 11/24/2020 08:11:26 -------------------------------------------------------------------------------- Multi Wound Chart Details Patient Name: Date of Service: [name], [name] 11/24/2020 7:30 [name] Medical Record  Number: [id number] Patient Account Number: [id number] Date of Birth/Sex: Treating RN: 10-Jan-1986 (35 y.o. [name] Primary Care Braelyn Jenson: Other Clinician: Janine Limbo Referring Leonardo Makris: Treating Dequandre Cordova/Extender: [name] [name] in Treatment: 255 Vital Signs Height(in): 70 Pulse(bpm): 60 Weight(lbs): 216 Blood Pressure(mmHg): 133/82 Body Mass Index(BMI): 31 Temperature(F): 98.1 Respiratory Rate(breaths/min): 18 Photos: [38R:No Photos Right T - Web between 1st and 2nd Left Ischium oe] [41R:No Photos] [47:No Photos Right, Dorsal Foot] Wound Location: [38R:Gradually Appeared] [41R:Gradually Appeared] [47:Gradually Appeared] Wounding Event: [38R:Inflammatory] [41R:Pressure Ulcer] [47:Pressure Ulcer] Primary Etiology: [38R:Sleep Apnea, Hypertension, Paraplegia Sleep Apnea, Hypertension, Paraplegia Sleep Apnea, Hypertension, Paraplegia] Comorbid History: [38R:11/30/2019] [41R:03/16/2020] [47:11/02/2020] Date Acquired: [38R:51] [41R:36] [47:3] [name] of Treatment: [38R:Open] [41R:Open] [47:Open] Wound Status: [38R:Yes] [41R:Yes] [47:No] Wound Recurrence: [38R:No] [41R:Yes] [47:No] Clustered Wound: [38R:[name]] [41R:3] [47:[name]] Clustered Quantity: [38R:0.5x0.3x0.3] [41R:4x3.5x0.1] [47:1x3.4x0.1] Measurements L x W x D (cm)  [38R:0.118] [41R:10.996] [47:2.67] [name] (cm) : rea [38R:0.035] [41R:1.1] [47:0.267] Volume (cm) : [38R:64.20%] [41R:-508.90%] [47:11.70%] % Reduction in [name] rea: [38R:84.80%] [41R:-507.70%] [47:11.60%] % Reduction in Volume: [38R:Full Thickness Without Exposed] [41R:Category/Stage II] [47:Category/Stage II] Classification: [38R:Support Structures Small] [41R:Medium] [47:Medium] Exudate Amount: [38R:Serous] [41R:Serosanguineous] [47:Serosanguineous] Exudate Type: [38R:amber] [41R:red, brown] [47:red, brown] Exudate Color: [38R:Flat and Intact] [41R:Distinct, outline attached] [47:Distinct, outline attached] Wound Margin: [38R:Medium (34-66%)] [41R:Large (67-100%)] [47:Medium (34-66%)] Granulation Amount: [38R:Pink] [41R:Red, Friable] [47:Red, Pink] Granulation Quality: [38R:Medium (34-66%)] [41R:Small (1-33%)] [47:Medium (34-66%)] Necrotic Amount: [38R:Fat Layer (Subcutaneous Tissue): Yes Fat Layer (Subcutaneous Tissue): Yes Fascia: No] Exposed Structures: [38R:Fascia: No Tendon: No Muscle: No Joint: No Bone: No Large (67-100%)] [41R:Fascia: No Tendon: No Muscle: No Joint: No Bone: No Medium (34-66%)] [47:Fat Layer (Subcutaneous Tissue): No Tendon: No Muscle: No Joint: No Bone: No Small (1-33%)] Treatment Notes Electronic Signature(s) Signed: 11/24/2020 3:00:47 PM By: Linton Ham MD Signed: 11/25/2020 5:50:13 PM By: Levan Hurst RN, BSN Entered By: Linton Ham on 11/24/2020 08:47:21 -------------------------------------------------------------------------------- Multi-Disciplinary Care Plan Details Patient Name: Date of Service: [name], [name] 11/24/2020 7:30 [name] Medical Record Number: [id number] Patient Account Number: [id number] Date of Birth/Sex: Treating RN: 06/24/1986 (35 y.o. [name] Primary Care Antonio Creswell: [name] Other Clinician: Referring Vergia Chea: Treating Jodey Burbano/Extender: [name] [name] in Treatment: 255 Active Inactive Wound/Skin  Impairment Nursing Diagnoses: Impaired tissue integrity Knowledge deficit related to ulceration/compromised skin integrity Goals: Patient/caregiver will verbalize understanding of skin care regimen Date Initiated: 01/05/2016 Target Resolution Date: 12/26/2020 Goal Status: Active Ulcer/skin breakdown will have [name] volume reduction of 30% by week 4 Date Initiated: 01/05/2016 Date Inactivated: 12/22/2017 Target Resolution Date: 01/19/2018 Unmet Reason: complex wounds, Goal Status: Unmet infection Interventions: Assess patient/caregiver ability to obtain necessary supplies Assess ulceration(s) every visit Provide education on ulcer and skin care Notes: Electronic Signature(s) Signed: 11/25/2020 5:50:13 PM By: Levan Hurst RN, BSN Entered By: Levan Hurst on 11/24/2020 08:31:03 -------------------------------------------------------------------------------- Pain Assessment Details Patient Name: Date of Service: [name], [name] 11/24/2020 7:30 [name] Medical Record Number: [id number] Patient Account Number: [id number] Date of Birth/Sex: Treating RN: 02-26-1986 (35 y.o. [name] Primary Care Latravion Graves: [place], [place] Other Clinician: Referring Makaia Rappa: Treating Paislie Tessler/Extender: [name] [name] in Treatment: 255 Active Problems Location of Pain Severity and Description of Pain Patient Has Paino No Site Locations Pain Management and Medication Current Pain Management: Electronic Signature(s) Signed: 11/25/2020 11:46:23 AM By: [name] Signed: 11/25/2020 5:50:13 PM By: Levan Hurst RN, BSN Entered By: [name] on 11/24/2020 07:58:41 -------------------------------------------------------------------------------- Patient/Caregiver Education Details Patient Name: Date of Service: [name], [name] 1/3/2022andnbsp7:30 [name]  Medical Record Number: [id number] Patient Account Number: [id number] Date of Birth/Gender: Treating RN: November 04, 1986 (35 y.o. [name] Primary Care Physician: Janine Limbo Other Clinician: Referring Physician: Treating Physician/Extender: [name] [name] in Treatment: 46 Education Assessment Education Provided To: Patient Education Topics Provided Wound/Skin Impairment: Methods: Explain/Verbal Responses: State content correctly Electronic Signature(s) Signed: 11/25/2020 5:50:13 PM By: Levan Hurst RN, BSN Entered By: Levan Hurst on 11/24/2020 08:31:20 -------------------------------------------------------------------------------- Wound Assessment Details Patient Name: Date of Service: [name], [name] 11/24/2020 7:30 [name] Medical Record Number: [id number] Patient Account Number: [id number] Date of Birth/Sex: Treating RN: [birth year] (35 y.o. [name] Primary Care Ambriel Gorelick: [name] Other Clinician: Referring Jullie Arps: Treating Coolidge Gossard/Extender: [name] [name] in Treatment: 255 Wound Status Wound Number: [id number] Primary Etiology: Inflammatory Wound Location: Right T - Web between 1st and 2nd oe Wound Status: Open Wounding Event: Gradually Appeared Comorbid History: Sleep Apnea, Hypertension, Paraplegia Date Acquired: 11/30/2019 [name] Of Treatment: 51 Clustered Wound: No Wound Measurements Length: (cm) 0.5 Width: (cm) 0.3 Depth: (cm) 0.3 Area: (cm) 0.118 Volume: (cm) 0.035 % Reduction in Area: 64.2% % Reduction in Volume: 84.8% Epithelialization: Large (67-100%) Tunneling: No Undermining: No Wound Description Classification: Full Thickness Without Exposed Support Structures Wound Margin: Flat and Intact Exudate Amount: Small Exudate Type: Serous Exudate Color: amber Foul Odor After Cleansing: No Slough/Fibrino Yes Wound Bed Granulation Amount: Medium (34-66%) Exposed Structure Granulation Quality: Pink Fascia Exposed: No Necrotic Amount: Medium (34-66%) Fat Layer (Subcutaneous Tissue) Exposed: Yes Necrotic Quality:  Adherent Slough Tendon Exposed: No Muscle Exposed: No Joint Exposed: No Bone Exposed: No Treatment Notes Wound #38R (Toe - Web between 1st and 2nd) Wound Laterality: Right Cleanser Soap and Water Discharge Instruction: May shower and wash wound with dial antibacterial soap and water prior to dressing change. Peri-Wound Care Sween Lotion (Moisturizing lotion) Discharge Instruction: Apply moisturizing lotion as directed Topical Primary Dressing KerraCel Ag Gelling Fiber Dressing, 2x2 in (silver alginate) Discharge Instruction: Apply silver alginate to wound bed as instructed Secondary Dressing Woven Gauze Sponge, Non-Sterile 4x4 in Discharge Instruction: Apply over primary dressing as directed. Secured With [employer], 4.5x3.1 (in/yd) Discharge Instruction: Secure with Kerlix as directed. Compression Wrap Compression Stockings Add-Ons Electronic Signature(s) Signed: 11/25/2020 5:38:12 PM By: Rhae Hammock RN Signed: 11/25/2020 5:50:13 PM By: Levan Hurst RN, BSN Entered By: Rhae Hammock on 11/24/2020 08:12:06 -------------------------------------------------------------------------------- Wound Assessment Details Patient Name: Date of Service: Pascarella, [name] 11/24/2020 7:30 [name] Medical Record Number: [id number] Patient Account Number: [id number] Date of Birth/Sex: Treating RN: 1986-09-11 (35 y.o. [name] Primary Care Taimane Stimmel: [place], [place] Other Clinician: Referring Assunta Pupo: Treating Darchelle Nunes/Extender: [name] [name] in Treatment: 255 Wound Status Wound Number: [id number] Primary Etiology: Pressure Ulcer Wound Location: Left Ischium Wound Status: Open Wounding Event: Gradually Appeared Comorbid History: Sleep Apnea, Hypertension, Paraplegia Date Acquired: 03/16/2020 [name] Of Treatment: 36 Clustered Wound: Yes Wound Measurements Length: (cm) 4 Width: (cm) 3.5 Depth: (cm) 0.1 Clustered Quantity: 3 Area: (cm)  10.996 Volume: (cm) 1.1 Wound Description Classification: Category/Stage II Wound Margin: Distinct, outline attached Exudate Amount: Medium Exudate Type: Serosanguineous Exudate Color: red, brown Foul Odor After Cleansing: Slough/Fibrino % Reduction in Area: -508.9% % Reduction in Volume: -507.7% Epithelialization: Medium (34-66%) Tunneling: No Undermining: No No Yes Wound Bed Granulation Amount: Large (67-100%) Exposed Structure Granulation Quality: Red, Friable Fascia Exposed: No Necrotic Amount: Small (1-33%) Fat Layer (Subcutaneous Tissue) Exposed: Yes Necrotic Quality: Adherent Slough Tendon Exposed: No Muscle Exposed: No Joint Exposed:  No Bone Exposed: No Treatment Notes Wound #41R (Ischium) Wound Laterality: Left Cleanser Soap and Water Discharge Instruction: May shower and wash wound with dial antibacterial soap and water prior to dressing change. Peri-Wound Care Triad Hydrophilic Wound Dressing Tube, 6 (oz) Discharge Instruction: Apply to periwound with each dressing change Topical Primary Dressing Hydrofera Blue Ready Foam, 2.5 x2.5 in Discharge Instruction: Apply to wound bed as instructed Secondary Dressing ComfortFoam Border, 4x4 in (silicone border) Discharge Instruction: Apply over primary dressing as directed. Secured With Compression Wrap Compression Stockings Environmental education officer) Signed: 11/25/2020 5:38:12 PM By: Rhae Hammock RN Signed: 11/25/2020 5:50:13 PM By: Levan Hurst RN, BSN Entered By: Rhae Hammock on 11/24/2020 08:12:35 -------------------------------------------------------------------------------- Wound Assessment Details Patient Name: Date of Service: [place], [name] 11/24/2020 7:30 [name] Medical Record Number: [id number] Patient Account Number: [id number] Date of Birth/Sex: Treating RN: 1986-10-26 (35 y.o. [name] Primary Care Ashe Graybeal: [name] Other Clinician: Referring Rubylee Zamarripa: Treating  Aletha Allebach/Extender: [name] [name] in Treatment: 255 Wound Status Wound Number: [id number] Primary Etiology: Pressure Ulcer Wound Location: Right, Dorsal Foot Wound Status: Open Wounding Event: Gradually Appeared Comorbid History: Sleep Apnea, Hypertension, Paraplegia Date Acquired: 11/02/2020 [name] Of Treatment: 3 Clustered Wound: No Wound Measurements Length: (cm) 1 Width: (cm) 3.4 Depth: (cm) 0.1 Area: (cm) 2.67 Volume: (cm) 0.267 % Reduction in Area: 11.7% % Reduction in Volume: 11.6% Epithelialization: Small (1-33%) Tunneling: No Undermining: No Wound Description Classification: Category/Stage II Wound Margin: Distinct, outline attached Exudate Amount: Medium Exudate Type: Serosanguineous Exudate Color: red, brown Foul Odor After Cleansing: No Slough/Fibrino Yes Wound Bed Granulation Amount: Medium (34-66%) Exposed Structure Granulation Quality: Red, Pink Fascia Exposed: No Necrotic Amount: Medium (34-66%) Fat Layer (Subcutaneous Tissue) Exposed: No Necrotic Quality: Adherent Slough Tendon Exposed: No Muscle Exposed: No Joint Exposed: No Bone Exposed: No Treatment Notes Wound #47 (Foot) Wound Laterality: Dorsal, Right Cleanser Soap and Water Discharge Instruction: May shower and wash wound with dial antibacterial soap and water prior to dressing change. Peri-Wound Care Topical Primary Dressing KerraCel Ag Gelling Fiber Dressing, 2x2 in (silver alginate) Discharge Instruction: Apply silver alginate to wound bed as instructed Secondary Dressing Woven Gauze Sponge, Non-Sterile 4x4 in Discharge Instruction: Apply over primary dressing as directed. Secured With [employer], 4.5x3.1 (in/yd) Discharge Instruction: Secure with Kerlix as directed. Compression Wrap Compression Stockings Add-Ons Electronic Signature(s) Signed: 11/25/2020 5:38:12 PM By: Rhae Hammock RN Signed: 11/25/2020 5:50:13 PM By: Levan Hurst RN, BSN Entered By:  Rhae Hammock on 11/24/2020 08:13:02 -------------------------------------------------------------------------------- Vitals Details Patient Name: Date of Service: [place], [name] 11/24/2020 7:30 [name] Medical Record Number: [id number] Patient Account Number: [id number] Date of Birth/Sex: Treating RN: 1986-09-28 (35 y.o. [name] Primary Care Ara Mano: [place], [place] Other Clinician: Referring Yaneliz Radebaugh: Treating Annalycia Done/Extender: [name] [name] in Treatment: 255 Vital Signs Time Taken: 07:58 Temperature (F): 98.1 Height (in): 70 Pulse (bpm): 88 Weight (lbs): 216 Respiratory Rate (breaths/min): 18 Body Mass Index (BMI): 31 Blood Pressure (mmHg): 133/82 Reference Range: 80 - 120 mg / dl Electronic Signature(s) Signed: 11/25/2020 11:46:23 AM By: [name] Entered By: [name] on 11/24/2020 07:58:31

## 2020-12-08 ENCOUNTER — Encounter (HOSPITAL_BASED_OUTPATIENT_CLINIC_OR_DEPARTMENT_OTHER): Payer: 59 | Admitting: Internal Medicine

## 2020-12-15 ENCOUNTER — Other Ambulatory Visit: Payer: Self-pay

## 2020-12-15 ENCOUNTER — Encounter (HOSPITAL_BASED_OUTPATIENT_CLINIC_OR_DEPARTMENT_OTHER): Payer: 59 | Admitting: Internal Medicine

## 2020-12-15 DIAGNOSIS — I87332 Chronic venous hypertension (idiopathic) with ulcer and inflammation of left lower extremity: Secondary | ICD-10-CM | POA: Diagnosis not present

## 2020-12-15 NOTE — Progress Notes (Signed)
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 aggressively for this although we have not made any improvement here. 08/11/17; left lateral leg. Surface continues to improve with the Iodoflex although we are not seeing much improvement in overall wound dimensions. Areas on his plantar left foot and right foot show no improvement. In fact the right foot looks somewhat worse 08/18/17; left lateral leg. We changed to [facility] Blue last week after a prolonged course of Iodoflex which helps get the surface better. It appears that the wound with is improved. Continue with difficult areas on the left dorsal first second and plantar first second on the right  09/01/17; patient arrives in clinic today having had a temperature of 103 yesterday. He was seen in the ER and [facility]. The patient was concerned he could have cellulitis again in the right leg however they diagnosed him with a UTI and he is now on Keflex. He has a history of cellulitis which is been recurrent and difficult but this is been in the left leg, in the past 5 use doxycycline. He does in and out catheterizations at home which are risk factors for UTI 09/08/17; patient will be completing his Keflex this weekend. The erythema on the left leg is considerably better. He has a new  wound today on the medial part of the right leg small superficial almost looks like a skin tear. He has worsening of the area on the right dorsal first and second toe. His major area on the left lateral leg is better. Using Hydrofera Blue on all areas 09/15/17; gradual reduction in width on the long wound in the left lateral leg. No debridement required. He also has wounds on the plantar aspect of his left first second toe web space and on the dorsal aspect of the right first second toe web space. 09/22/17; there continues to be very gradual improvements in the dimensions of the left lateral leg wound. He hasn't round erythematous spot with might be pressure on his wheelchair. There is no evidence obviously of infection no purulence no warmth He has a dry scaled area on the plantar aspect of the left first second toe Improved area on the dorsal right first second toe. 09/29/17; left lateral leg wound continues to improve in dimensions mostly with an is still a fairly long but increasingly narrow wound. He has a dry scaled area on the plantar aspect of his left first second toe web space Increasingly concerning area on the dorsal right first second toe. In fact I am concerned today about possible cellulitis around this wound. The areas extending up his second toe and although there is deformities here almost appears to abut on the nailbed. 10/06/17; left lateral leg wound continues to make very gradual progress. Tissue culture I did from the right first second toe dorsal foot last time grew MRSA and enterococcus which was vancomycin sensitive. This was not sensitive to clindamycin or doxycycline. He is allergic to Zyvox and sulfa we have therefore arrange for him to have dalvance infusion tomorrow. He is had this in the past and tolerated it well 10/20/17; left lateral leg wound continues to make decent progress. This is certainly reduced in terms of with there is advancing epithelialization.The  cellulitis in the right foot looks better although he still has a deep wound in the dorsal aspect of the first second toe web space. Plantar left first toe web space on the left I think is making some progress 10/27/17; left lateral leg wound continues to make decent progress. Advancing epithelialization.using Hydrofera Blue The right first second toe web space wound is better-looking using silver alginate Improvement in the left plantar first second toe web space. Again using silver alginate 11/03/17 left lateral leg wound continues to make decent progress albeit slowly. Using [facility] The right per second toe web space continues to be a very problematic looking punched out wound. I obtained a piece of tissue for deep culture I did extensively treated this for fungus. It is difficult to imagine that this is a pressure area as the patient states other than going outside he doesn't really wear shoes at home The  left plantar first second toe web space looked fairly senescent. Necrotic edges. This required debridement change to [facility] Blue to all wound areas 11/10/17; left lateral leg wound continues to contract. Using Hydrofera Blue On the right dorsal first second toe web space dorsally. Culture I did of this area last week grew MRSA there is not an easy oral option in this patient was multiple antibiotic allergies or intolerances. This was only a rare culture isolate I'm therefore going to use Bactroban under silver alginate On the left plantar first second toe web space. Debridement is required here. This is also unchanged 11/17/17; left lateral leg wound continues to contract using Hydrofera Blue this is no longer the major issue. The major concern here is the right first second toe web space. He now has an open area going from dorsally to the plantar aspect. There is now wound on the inner lateral part of the first toe. Not a very viable surface on this. There is erythema spreading  medially into the forefoot. No major change in the left first second toe plantar wound 11/24/17; left lateral leg wound continues to contract using Hydrofera Blue. Nice improvement today The right first second toe web space all of this looks a lot less angry than last week. I have given him clindamycin and topical Bactroban for MRSA and terbinafine for the possibility of underlining tinea pedis that I could not control with ketoconazole. Looks somewhat better The area on the plantar left first second toe web space is weeping with dried debris around the wound 12/01/17; left lateral leg wound continues to contract he Hydrofera Blue. It is becoming thinner in terms of with nevertheless it is making good improvement. The right first second toe web space looks less angry but still a large necrotic-looking wounds starting on the plantar aspect of the right foot extending between the toes and now extensively on the base of the right second toe. I gave him clindamycin and topical Bactroban for MRSA anterior benefiting for the possibility of underlying tinea pedis. Not looking better today The area on the left first/second toe looks better. Debrided of necrotic debris 12/05/17* the patient was worked in urgently today because over the weekend he found blood on his incontinence bad when he woke up. He was found to have an ulcer by his wife who does most of his wound care. He came in today for [place] to look at this. He has not had a history of wounds in his buttocks in spite of his paraplegia. 12/08/17; seen in follow-up today at his usual appointment. He was seen earlier this week and found to have a new wound on his buttock. We also follow him for wounds on the left lateral leg, left first second toe web space and right first second toe web space 12/15/17; we have been using Hydrofera Blue to the left lateral leg which has improved. The right first second toe web space has also improved. Left first second toe web  space plantar aspect looks stable. The left buttock has worsened using Santyl. Apparently the buttock has drainage 12/22/17; we have been using Hydrofera Blue to the left lateral leg which continues to improve now 2 small wounds separated by normal skin. He tells [place] he had a fever up to 100 yesterday he is prone to UTIs but has not noted anything different. He does in and out catheterizations. The area between the first and second toes today does not look good necrotic surface covered with what looks  to be purulent drainage and erythema extending into the third toe. I had gotten this to something that I thought look better last time however it is not look good today. He also has a necrotic surface over the buttock wound which is expanded. I thought there might be infection under here so I removed a lot of the surface with a #5 curet though nothing look like it really needed culturing. He is been using Santyl to this area 12/27/17; his original wound on the left lateral leg continues to improve using Hydrofera Blue. I gave him samples of Baxdella although he was unable to take them out of fear for an allergic reaction ["lump in his throat"].the culture I did of the purulent drainage from his second toe last week showed both enterococcus and a set Enterobacter I was also concerned about the erythema on the bottom of his foot although paradoxically although this looks somewhat better today. 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 We've been using silver alginate here. They came up today that he sleeps in a recliner, I'm not sure why but I asked him to stop this 01/03/18; his original wound we've been using Hydrofera Blue is now separated into 2 areas. Ulcer on his left buttock is better he is off the recliner and sleeping in bed Finally both wound areas between his first and second toes also looks some  better 01/10/18; his original wound on the left lateral leg is now separated into 2 wounds we've been using Hydrofera Blue Ulcer on his left buttock has some drainage. There is a small probing site going into muscle layer superiorly.using silver alginate -He arrives today with a deep tissue injury on the left heel The wound on the dorsal aspect of his first second toe on the left looks a lot betterusing silver alginate ketoconazole The area on the first second toe web space on the right also looks a lot bette 01/17/18; his original wound on the left lateral leg continues to progress using Hydrofera Blue Ulcer on his left buttock also is smaller surface healthier except for a small probing site going into the muscle layer superiorly. 2.4 cm of tunneling in this area DTI on his left heel we have only been offloading. Looks better than last week no threatened open no evidence of infection the wound on the dorsal aspect of the first second toe on the left continues to look like it's regressing we have only been using silver alginate and terbinafine orally The area in the first second toe web space on the right also looks to be a lot better using silver alginate and terbinafine I think this was prompted by tinea pedis 01/31/18; the patient was hospitalized in [place] last week apparently for a complicated UTI. He was discharged on cefepime he does in and out catheterizations. In the hospital he was discovered M I don't mild elevation of AST and ALT and the terbinafine was stopped.predictably the pressure ulcer on s his buttock looks betterusing silver alginate. The area on the left lateral leg also is better using Hydrofera Blue. The area between the first and second toes on the left better. First and second toes on the right still substantial but better. Finally the DTI on the left heel has held together and looks like it's resolving 02/07/18-he is here in follow-up evaluation for multiple ulcerations.  He has new injury to the lateral aspect of  the last issue a pressure ulcer, he states this is from adhesive removal trauma. He states he has tried multiple adhesive products with no success. All other ulcers appear stable. The left heel DTI is resolving. We will continue with same treatment plan and follow-up next week. 02/14/18; follow-up for multiple areas. He has a new area last week on the lateral aspect of his pressure ulcer more over the posterior trochanter. The original pressure ulcer looks quite stable has healthy granulation. 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 Almost fully epithelialized on the original superior area using Hydrofera Blue DTI on the left heel has peeled off this week to reveal a small superficial wound under denuded skin and subcutaneous tissue Both areas between the first and second toes look better including nothing open on the left 02/21/18; The patient's wounds on his left ischial tuberosity and posterior left greater trochanter actually looked better. He has a large area of irritation around the area which I think is contact dermatitis. I am doubtful that this is fungal His original wound on the left lateral calf continues to improve we have been using Hydrofera Blue There is no open area in the left first second toe web space although there is a lot of thick callus The DTI on the left heel required debridement today of necrotic surface eschar and subcutaneous tissue using silver alginate Finally the area on the right first second toe webspace continues to contract using silver alginate and ketoconazole 02/28/18 Left ischial tuberosity wounds look better using silver alginate. Original wound on the left calf only has one small open area left using Hydrofera Blue DTI on the left heel required debridement mostly removing skin from around this wound surface. Using  silver alginate The areas on the right first/second toe web space using silver alginate and ketoconazole 03/08/18 on evaluation today patient appears to be doing decently well as best I can tell in regard to his wounds. This is the first time that I have seen him as he generally is followed by Dr. Dellia Nims. With that being said none of his wounds appear to be infected he does have an area where there is some skin covering what appears to be a new wound on the left dorsal surface of his great toe. This is right at the nail bed. With that being said I do believe that debrided away some of the excess skin can be of benefit in this regard. Otherwise he has been tolerating the dressing changes without complication. 03/14/18; patient arrives today with the multiplicity of wounds that we are following. He has not been systemically unwell Original wound on the left lateral calf now only has 2 small open areas we've been using Hydrofera Blue which should continue The deep tissue injury on the left heel requires debridement today. We've been using silver alginate The left first second toe and the right first second toe are both are reminiscence what I think was tinea pedis. Apparently some of the callus Surface between the toes was removed last week when it started draining. Purulent drainage coming from the wound on the ischial tuberosity on the left. 03/21/18-He is here in follow-up evaluation for multiple wounds. 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 He tolerated debridement. The only change to last week's recommendations is to discontinue antifungal cream between toes. He will follow-up next week  03/28/18; following up for multiple wounds;Concern this week is streaking redness and swelling in the right foot. He is going to need antibiotics for this. 03/31/18; follow-up for right foot cellulitis. Streaking redness and swelling in the  right foot on 03/28/18. 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 I put him on clindamycin 300 mg every 6 and brought him in for a quick check. He has an open wound between his first and second toes on the right foot as a potential source. 04/04/18; Right foot cellulitis is resolving he is completing clindamycin. This is truly good news Left lateral calf wound which is initial wound only has one small open area inferiorly this is close to healing out. He has compression stockings. We will use Hydrofera Blue right down to the epithelialization of this Nonviable surface on the left heel which was initially pressure with a DTI. We've been using Hydrofera Blue. I'm going to switch this back to silver alginate Left first second toe/tinea pedis this looks better using silver alginate Right first second toe tinea pedis using silver alginate Large pressure ulcers on theLeft ischial tuberosity. Small wound here Looks better. I am uncertain about the surface over the large wound. Using silver alginate 04/11/18; Cellulitis in the right foot is resolved Left lateral calf wound which was his original wounds still has 2 tiny open areas remaining this is just about closed Nonviable surface on the left heel is better but still requires debridement Left first second toe/tinea pedis still open using silver alginate Right first second toe wound tinea pedis I asked him to go back to using ketoconazole and silver alginate Large pressure ulcers on the left ischial tuberosity this shear injury here is resolved. Wound is smaller. No evidence of infection using silver alginate 04/18/18; Patient arrives with an intense area of cellulitis in the right mid lower calf extending into the right heel area. Bright red and warm. Smaller area on the left anterior leg. He has a significant history of MRSA. He will definitely need antibioticsdoxycycline He now has 2 open areas on the left ischial tuberosity the  original large wound and now a satellite area which I think was above his initial satellite areas. Not a wonderful surface on this satellite area surrounding erythema which looks like pressure related. His left lateral calf wound again his original wound is just about closed Left heel pressure injury still requiring debridement Left first second toe looks a lot better using silver alginate Right first second toe also using silver alginate and ketoconazole cream also looks better 04/20/18; the patient was worked in early today out of concerns with his cellulitis on the right leg. I had started him on doxycycline. This was 2 days ago. His wife was concerned about the swelling in the area. Also concerned about the left buttock. He has not been systemically unwell no fever chills. No nausea vomiting or diarrhea 04/25/18; the patient's left buttock wound is continued to deteriorate he is using Hydrofera Blue. He is still completing clindamycin for the cellulitis on the right leg although all of this looks better. 05/02/18 Left buttock wound still with a lot of drainage and a very tightly adherent fibrinous necrotic surface. He has a deeper area superiorly The left lateral calf wound is still closed DTI wound on the left heel necrotic surface especially the circumference using Iodoflex Areas between his left first second toe and right first second toe both look better. Dorsally and the right first second toe he  had a necrotic surface although at smaller. In using silver alginate and ketoconazole. I did a culture last week which was a deep tissue culture of the reminiscence of the open wound on the right first second toe dorsally. This grew a few Acinetobacter and a few methicillin-resistant staph aureus. Nevertheless the area actually this week looked better. I didn't feel the need to specifically address this at least in terms of systemic antibiotics. 05/09/18; wounds are measuring larger more drainage per  our intake. We are using Santyl covered with alginate on the large superficial buttock wounds, Iodosorb on the left heel, ketoconazole and silver alginate to the dorsal first and second toes bilaterally. 05/16/18; The area on his left buttock better in some aspects although the area superiorly over the ischial tuberosity required an extensive debridement.using Santyl Left heel appears stable. Using Iodoflex The areas between his first and second toes are not bad however there is spreading erythema up the dorsal aspect of his left foot this looks like cellulitis again. He is insensate the erythema is really very brilliant.o Erysipelas He went to see an allergist days ago because he was itching part of this he had lab work done. This showed a white count of 15.1 with 70% neutrophils. Hemoglobin of 11.4 and a platelet count of 659,000. Last white count we had in Epic was a 2-1/2 years ago which was 25.9 but he was ill at the time. He was able to show me some lab work that was done by his primary physician the pattern is about the same. 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 But prompted me to go ahead and do x-rays of both feet and the pelvis rule out osteomyelitis. He also had a comprehensive metabolic panel this was reasonably normal his albumin was 3.7 liver function tests BUN/creatinine all normal 05/23/18; x-rays of both his feet from last week were negative for underlying pulmonary abnormality. The x-ray of his pelvis however showed mild irregularity in the left ischial which may represent some early osteomyelitis. The wound in the left ischial continues to get deeper clearly now exposed muscle. Each week necrotic surface material over this area. Whereas the rest of the wounds do not look so bad. The left ischial wound we have been using Santyl and calcium alginate T the left heel surface necrotic debris using Iodoflex o The left lateral leg is still  healed Areas on the left dorsal foot and the right dorsal foot are about the same. There is some inflammation on the left which might represent contact dermatitis, fungal dermatitis I am doubtful cellulitis although this looks better than last week 05/30/18; CT scan done at Hospital did not show any osteomyelitis or abscess. Suggested the possibility of underlying cellulitis although I don't see a lot of evidence of this at the bedside The wound itself on the left buttock/upper thigh actually looks somewhat better. No debridement Left heel also looks better no debridement continue Iodoflex Both dorsal first second toe spaces appear better using Lotrisone. Left still required debridement 06/06/18; Intake reported some purulent looking drainage from the left gluteal wound. Using Santyl and calcium alginate Left heel looks better although still a nonviable surface requiring debridement The left dorsal foot first/second webspace actually expanding and somewhat deeper. I may consider doing a shave biopsy of this area Right dorsal foot first/second webspace appears stable to improved. Using Lotrisone and silver alginate to both these areas 06/13/18 Left gluteal surface looks  better. Now separated in the 2 wounds. No debridement required. Still drainage. We'll continue silver alginate Left heel continues to look better with Iodoflex continue this for at least another week Of his dorsal foot wounds the area on the left still has some depth although it looks better than last week. We've been using Lotrisone and silver alginate 06/20/18 Left gluteal continues to look better healthy tissue Left heel continues to look better healthy granulation wound is smaller. He is using Iodoflex and his long as this continues continue the Iodoflex Dorsal right foot looks better unfortunately dorsal left foot does not. There is swelling and erythema of his forefoot. He had minor trauma to this several days ago but doesn't  think this was enough to have caused any tissue injury. Foot looks like cellulitis, we have had this problem before 06/27/18 on evaluation today patient appears to be doing a little worse in regard to his foot ulcer. 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 Both of which would really be his only options for treating this infection. In the past he has been given and effusion of Orbactiv. This is done very well for him in the past again it's one time dosing IV antibiotic therapy. Subsequently I do believe this is something we're gonna need to see about doing at this point in time. Currently his other wounds seem to be doing somewhat better in my pinion I'm pretty happy in that regard. 07/03/18 on evaluation today patient's wounds actually appear to be doing fairly well. He has been tolerating the dressing changes without complication. All in all he seems to be showing signs of improvement. In regard to the antibiotics he has been dealing with infectious disease since I saw him last week as far as getting this scheduled. In the end [facility]'s [facility] this coming Friday. In the meantime he has been continuing to perform the dressing changes in such as previous. There does not appear to be any evidence of infection worsengin at this time. 07/10/18; Since I last saw this man 2 weeks ago things have actually improved. IV antibiotics of resulted in less forefoot erythema although there is still some present. He is not systemically unwell Left buttock wounds 2 now have no depth there is increased epithelialization Using silver alginate Left heel still requires debridement using Iodoflex Left dorsal foot still with a sizable wound about the size of a border but healthy granulation Right dorsal foot still with a  slitlike area using silver alginate 07/18/18; the patient's cellulitis in the left foot is improved in fact I think it is on its way to resolving. Left buttock wounds 2 both look better although the larger one has hypertension granulation we've been using silver alginate Left heel has some thick circumferential redundant skin over the wound edge which will need to be removed today we've been using Iodoflex Left dorsal foot is still a sizable wound required debridement using silver alginate The right dorsal foot is just about closed only a small open area remains here 07/25/18; left foot cellulitis is resolved Left buttock wounds 2 both look better. Hyper-granulation on the major area Left heel as some debris over the surface but otherwise looks a healthier wound. Using silver collagen Right dorsal foot is just about closed 07/31/18; arrives with  our intake nurse worried about purulent drainage from the buttock. We had hyper-granulation here last week His buttock wounds 2 continue to look better Left heel some debris over the surface but measuring smaller. Right dorsal foot unfortunately has openings between the toes Left foot superficial wound looks less aggravated. 08/07/18 Buttock wounds continue to look better although some of her granulation and the larger medial wound. silver alginate Left heel continues to look a lot better.silver collagen Left foot superficial wound looks less stable. Requires debridement. He has a new wound superficial area on the foot on the lateral dorsal foot. Right foot looks better using silver alginate without Lotrisone 08/14/2018; patient was in the ER last week diagnosed with a UTI. He is now on Cefpodoxime and Macrodantin. Buttock wounds continued to be smaller. Using silver alginate Left heel continues to look better using silver collagen Left foot superficial wound looks as though it is improving Right dorsal foot area is just about healed. 08/21/2018; patient is  completed his antibiotics for his UTI. He has 2 open areas on the buttocks. There is still not closed although the surface looks satisfactory. Using silver alginate Left heel continues to improve using silver collagen The bilateral dorsal foot areas which are at the base of his first and second toes/possible tinea pedis are actually stable on the left but worse on the right. The area on the left required debridement of necrotic surface. After debridement I obtained a specimen for PCR culture. The right dorsal foot which is been just about healed last week is now reopened 08/28/2018; culture done on the left dorsal foot showed coag negative staph both staph epidermidis and Lugdunensis. I think this is worthwhile initiating systemic treatment. I will use doxycycline given his long list of allergies. The area on the left heel slightly improved but still requiring debridement. The large wound on the buttock is just about closed whereas the smaller one is larger. Using silver alginate in this area 09/04/2018; patient is completing his doxycycline for the left foot although this continues to be a very difficult wound area with very adherent necrotic debris. We are using silver alginate to all his wounds right foot left foot and the small wounds on his buttock, silver collagen on the left heel. 09/11/2018; once again this patient has intense erythema and swelling of the left forefoot. Lesser degrees of erythema in the right foot. He has a long list of allergies and intolerances. I will reinstitute doxycycline. 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 Using silver alginate Left heel also looks better using silver collagen Unfortunately both the areas on his feet look worse. The area on the left first second webspace is now gone through to the plantar part of his foot. The area on the left foot anteriorly is irritated with erythema and swelling in the  forefoot. 09/25/2018 His wound on the left plantar heel looks better. Using silver collagen The area on the left buttock 2 small remnant areas. One is closed one is still open. Using silver alginate The areas between both his first and second toes look worse. This in spite of long-standing antifungal therapy with ketoconazole and silver alginate which should have antifungal activity He has small areas around his original wound on the left calf one is on the bottom of the original scar tissue and one superiorly both of these are small and superficial but again given wound history in this site this is  worrisome 10/02/2018 Left plantar heel continues to gradually contract using silver collagen Left buttock wound is unchanged using silver alginate The areas on his dorsal feet between his first and second toes bilaterally look about the same. I prescribed clindamycin ointment to see if we can address chronic staph colonization and also the underlying possibility of erythrasma The left lateral lower extremity wound is actually on the lateral part of his ankle. Small open area here. We have been using silver alginate 10/09/2018; Left plantar heel continues to look healthy and contract. No debridement is required Left buttock slightly smaller with a tape injury wound just below which was new this week Dorsal feet somewhat improved I have been using clindamycin Left lateral looks lower extremity the actual open area looks worse although a lot of this is epithelialized. I am going to change to silver collagen today He has a lot more swelling in the right leg although this is not pitting not red and not particularly warm there is a lot of spasm in the right leg usually indicative of people with paralysis of some underlying discomfort. We have reviewed his vascular status from 2017 he had a left greater saphenous vein ablation. I wonder about referring him back to vascular surgery if the area on the left  leg continues to deteriorate. 10/16/2018 in today for follow-up and management of multiple lower extremity ulcers. His left Buttock wound is much lower smaller and almost closed completely. The wound to the left ankle has began to reopen with Epithelialization and some adherent slough. He has multiple new areas to the left foot and leg. The left dorsal foot without much improvement. Wound present between left great webspace and 2nd toe. Erythema and edema present right leg. Right LE ultrasound obtained on 10/10/18 was negative for DVT . 10/23/2018; Left buttock is closed over. Still dry macerated skin but there is no open wound. I suspect this is chronic pressure/moisture Left lateral calf is quite a bit worse than when I saw this last. There is clearly drainage here he has macerated skin into the left plantar heel. We will change the primary dressing to alginate Left dorsal foot has some improvement in overall wound area. Still using clindamycin and silver alginate Right dorsal foot about the same as the left using clindamycin and silver alginate The erythema in the right leg has resolved. He is DVT rule out was negative Left heel pressure area required debridement although the wound is smaller and the surface is health 10/26/2018 The patient came back in for his nurse check today predominantly because of the drainage coming out of the left lateral leg with a recent reopening of his original wound on the left lateral calf. He comes in today with a large amount of surrounding erythema around the wound extending from the calf into the ankle and even in the area on the dorsal foot. He is not systemically unwell. He is not febrile. Nevertheless this looks like cellulitis. We have been using silver alginate to the area. I changed him to a regular visit and I am going to prescribe him doxycycline. The rationale here is a long list of medication intolerances and a history of MRSA. I did not see anything  that I thought would provide a valuable culture 10/30/2018 Follow-up from his appointment 4 days ago with really an extensive area of cellulitis in the left calf left lateral ankle and left dorsal foot. I put him on doxycycline. He has a long list of medication allergies  which are true allergy reactions. 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 In any case he is a lot better today. The erythema especially in the anterior and lateral left calf is better. He still has left ankle erythema. He also is complaining about increasing edema in the right leg we have only been using Kerlix Coban and he has been doing the wraps at home. Finally he has a spotty rash on the medial part of his upper left calf which looks like folliculitis or perhaps wrap occlusion type injury. Small superficial macules not pustules 11/06/18 patient arrives today with again a considerable degree of erythema around the wound on the left lateral calf extending into the dorsal ankle and dorsal foot. This is a lot worse than when I saw this last week. He is on doxycycline really with not a lot of improvement. He has not been systemically unwell Wounds on the; left heel actually looks improved. Original area on the left foot and proximity to the first and second toes looks about the same. He has superficial areas on the dorsal foot, anterior calf and then the reopening of his original wound on the left lateral calf which looks about the same The only area he has on the right is the dorsal webspace first and second which is smaller. He has a large area of dry erythematous skin on the left buttock small open area here. 11/13/2018; the patient arrives in much better condition. The erythema around the wound on the left lateral calf is a lot better. 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 In any case this is a lot better. The area on the left heel is very small and just about resolved using silver collagen we have been using silver alginate to the areas on his dorsal feet 11/20/2018; his wounds include the left lateral calf, left heel, dorsal aspects of both feet just proximal to the first second webspace. He is stable to slightly improved. I did not think any changes to his dressings were going to be necessary 11/27/2018 he has a reopening on the left buttock which is surrounded by what looks like tinea or perhaps some other form of dermatitis. The area on the left dorsal foot has some erythema around it I have marked this area but I am not sure whether this is cellulitis or not. Left heel is not closed. Left calf the reopening is really slightly longer and probably worse 1/13; in general things look better and smaller except for the left dorsal foot. Area on the left heel is just about closed, left buttock looks better only a small wound remains in the skin looks better [using Lotrisone] 1/20; the area on the left heel only has a few remaining open areas here. Left lateral calf about the same in terms of size, left dorsal foot slightly larger right lateral foot still not closed. The area on the left buttock has no open wound and the surrounding skin looks a lot better 1/27; the area on the left heel is closed. Left lateral calf better but still requiring extensive debridements. The area on his left buttock is closed. He still has the open areas on the left dorsal foot which is slightly smaller in the right foot which is slightly expanded. We have been using Iodoflex on these areas as well 2/3; left heel is  closed. Left lateral calf still requiring debridement using Iodoflex there is no open area on his left buttock however he has dry scaly skin over a large area of this. Not really responding well to the Lotrisone. Finally the areas on his dorsal feet at the level of the  first second webspace are slightly smaller on the right and about the same on the left. Both of these vigorously debrided with Anasept and gauze 2/10; left heel remains closed he has dry erythematous skin over the left buttock but there is no open wound here. Left lateral leg has come in and with. Still requiring debridement we have been using Iodoflex here. Finally the area on the left dorsal foot and right dorsal foot are really about the same extremely dry callused fissured areas. He does not yet have a dermatology appointment 2/17; left heel remains closed. He has a new open area on the left buttock. The area on the left lateral calf is bigger longer and still covered in necrotic debris. No major change in his foot areas bilaterally. I am awaiting for a dermatologist to look on this. We have been using ketoconazole I do not know that this is been doing any good at all. 2/24; left heel remains closed. The left buttock wound that was new reopening last week looks better. The left lateral calf appears better also although still requires debridement. The major area on his foot is the left first second also requiring debridement. We have been putting Prisma on all wounds. I do not believe that the ketoconazole has done too much good for his feet. He will use Lotrisone I am going to give him a 2-week course of terbinafine. We still do not have a dermatology appointment 3/2 left heel remains closed however there is skin over bone in this area I pointed this out to him today. The left buttock wound is epithelialized but still does not look completely stable. The area on the left leg required debridement were using silver collagen here. With regards to his feet we changed to Lotrisone last week and silver alginate. 3/9; left heel remains closed. Left buttock remains closed. The area on the right foot is essentially closed. The left foot remains unchanged. Slightly smaller on the left lateral calf. Using  silver collagen to both of these areas 3/16-Left heel remains closed. Area on right foot is closed. Left lateral calf above the lateral malleolus open wound requiring debridement with easy bleeding. Left dorsal wound proximal to first toe also debrided. Left ischial area open new. Patient has been using Prisma with wrapping every 3 days. Dermatology appointment is apparently tomorrow.Patient has completed his terbinafine 2-week course with some apparent improvement according to him, there is still flaking and dry skin in his foot on the left 3/23; area on the right foot is reopened. The area on the left anterior foot is about the same still a very necrotic adherent surface. He still has the area on the left leg and reopening is on the left buttock. He apparently saw dermatology although I do not have a note. According to the patient who is usually fairly well informed they did not have any good ideas. Put him on oral terbinafine which she is been on before. 3/30; using silver collagen to all wounds. Apparently his dermatologist put him on doxycycline and rifampin presumably some culture grew staph. I do not have this result. He remains on terbinafine although I have used terbinafine on him before 4/6; patient has had  a fairly substantial reopening on the right foot between the first and second toes. He is finished his terbinafine and I believe is on doxycycline and rifampin still as prescribed by dermatology. We have been using silver collagen to all his wounds although the patient reports that he thinks silver alginate does better on the wounds on his buttock. 4/13; the area on his left lateral calf about the same size but it did not require debridement. Left dorsal foot just proximal to the webspace between the first and second toes is about the same. Still nonviable surface. I note some superficial bronze discoloration of the dorsal part of his foot Right dorsal foot just proximal to the first  and second toes also looks about the same. I still think there may be the same discoloration I noted above on the left Left buttock wound looks about the same 4/20; left lateral calf appears to be gradually contracting using silver collagen. 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 The left dorsal foot wound is debrided of tightly adherent necrotic debris and really cleans up quite nicely. The right area is worse with expansion. I did not debride this it is now over the base of the second toe The area on his left buttock is smaller no debridement is required using silver collagen 5/4; left calf continues to make good progress. He arrives with erythema around the wounds on his dorsal foot which even extends to the plantar aspect. Very concerning for coexistent infection. He is finished the erythromycin I gave him for possible erythrasma this does not seem to have helped. The area on the left foot is about the same base of the dorsal toes Is area on the buttock looks improved on the left 5/11; left calf and left buttock continued to make good progress. Left foot is about the same to slightly improved. Major problem is on the right foot. He has not had an x-ray. Deep tissue culture I did last week showed both Enterobacter and E. coli. I did not change the doxycycline I put him on empirically although neither 1 of these were plated to doxycycline. He arrives today with the erythema looking worse on both the dorsal and plantar foot. Macerated skin on the bottom of the foot. he has not been systemically unwell 5/18-Patient returns at 1 week, left calf wound appears to be making some progress, left buttock wound appears slightly worse than last time, left foot wound looks slightly better, right foot redness is marginally better. X-ray of both feet show no air or evidence of osteomyelitis. Patient is finished his Omnicef and terbinafine. He  continues to have macerated skin on the bottom of the left foot as well as right 5/26; left calf wound is better, left buttock wound appears to have multiple small superficial open areas with surrounding macerated skin. X-rays that I did last time showed no evidence of osteomyelitis in either foot. He is finished cefdinir and doxycycline. I do not think that he was on terbinafine. He continues to have a large superficial open area on the right foot anterior dorsal and slightly between the first and second toes. I did send him to dermatology 2 months ago or so wondering about whether they would do a fungal scraping. I do not believe they did but did do a culture. We have been using silver alginate to the toe areas, he has been using antifungals at home topically either ketoconazole or Lotrisone. We are using silver  collagen on the left foot, silver alginate on the right, silver collagen on the left lateral leg and silver alginate on the left buttock 6/1; left buttock area is healed. We have the left dorsal foot, left lateral leg and right dorsal foot. We are using silver alginate to the areas on both feet and silver collagen to the area on his left lateral calf 6/8; the left buttock apparently reopened late last week. He is not really sure how this happened. He is tolerating the terbinafine. Using silver alginate to all wounds 6/15; left buttock wound is larger than last week but still superficial. Came in the clinic today with a report of purulence from the left lateral leg I did not identify any infection Both areas on his dorsal feet appear to be better. He is tolerating the terbinafine. Using silver alginate to all wounds 6/22; left buttock is about the same this week, left calf quite a bit better. His left foot is about the same however he comes in with erythema and warmth in the right forefoot once again. Culture that I gave him in the beginning of May showed Enterobacter and E. coli. I gave him  doxycycline and things seem to improve although neither 1 of these organisms was specifically plated. 6/29; left buttock is larger and dry this week. Left lateral calf looks to me to be improved. Left dorsal foot also somewhat improved right foot completely unchanged. The erythema on the right foot is still present. He is completing the Ceftin dinner that I gave him empirically [see discussion above.) 7/6 - All wounds look to be stable and perhaps improved, the left buttock wound is slightly smaller, per patient bleeds easily, completed ceftin, the right foot redness is less, he is on terbinafine 7/13; left buttock wound about the same perhaps slightly narrower. Area on the left lateral leg continues to narrow. Left dorsal foot slightly smaller right foot about the same. We are using silver alginate on the right foot and Hydrofera Blue to the areas on the left. Unna boot on the left 2 layer compression on the right 7/20; left buttock wound absolutely the same. Area on lateral leg continues to get better. Left dorsal foot require debridement as did the right no major change in the 7/27; left buttock wound the same size necrotic debris over the surface. The area on the lateral leg is closed once again. His left foot looks better right foot about the same although there is some involvement now of the posterior first second toe area. He is still on terbinafine which I have given him for a month, not certain a centimeter major change 06/25/19-All wounds appear to be slightly improved according to report, left buttock wound looks clean, both foot wounds have minimal to no debris the right dorsal foot has minimal slough. We are using Hydrofera Blue to the left and silver alginate to the right foot and ischial wound. 8/10-Wounds all appear to be around the same, the right forefoot distal part has some redness which was not there before, however the wound looks clean and small. Ischial wound looks about the  same with no changes 8/17; his wound on the left lateral calf which was his original chronic venous insufficiency wound remains closed. Since I last saw him the areas on the left dorsal foot right dorsal foot generally appear better but require debridement. The area on his left initial tuberosity appears somewhat larger to me perhaps hyper granulated and bleeds very easily. We have been  using Hydrofera Blue to the left dorsal foot and silver alginate to everything else 8/24; left lateral calf remains closed. The areas on his dorsal feet on the webspace of the first and second toes bilaterally both look better. The area on the left buttock which is the pressure ulcer stage II slightly smaller. I change the dressing to Hydrofera Blue to all areas 8/31; left lateral calf remains closed. The area on his dorsal feet bilaterally look better. Using Hydrofera Blue. Still requiring debridement on the left foot. No change in the left buttock pressure ulcers however 9/14; left lateral calf remains closed. Dorsal feet look quite a bit better than 2 weeks ago. Flaking dry skin also a lot better with the ammonium lactate I gave him 2 weeks ago. The area on the left buttock is improved. He states that his Roho cushion developed a leak and he is getting a new one, in the interim he is offloading this vigorously 9/21; left calf remains closed. Left heel which was a possible DTI looks better this week. He had macerated tissue around the left dorsal foot right foot looks satisfactory and improved left buttock wound. I changed his dressings to his feet to silver alginate bilaterally. Continuing Hydrofera Blue on the left buttock. 9/28 left calf remains closed. Left heel did not develop anything [possible DTI] dry flaking skin on the left dorsal foot. Right foot looks satisfactory. Improved left buttock wound. We are using silver alginate on his feet Hydrofera Blue on the buttock. I have asked him to go back to the  Lotrisone on his feet including the wounds and surrounding areas 10/5; left calf remains closed. The areas on the left and right feet about the same. A lot of this is epithelialized however debris over the remaining open areas. He is using Lotrisone and silver alginate. The area on the left buttock using Hydrofera Blue 10/26. Patient has been out for 3 weeks secondary to Covid concerns. He tested negative but I think his wife tested positive. He comes in today with the left foot substantially worse, right foot about the same. Even more concerning he states that the area on his left buttock closed over but then reopened and is considerably deeper in one aspect than it was before [stage III wound] 11/2; left foot really about the same as last week. Quarter sized wound on the dorsal foot just proximal to the first second toes. Surrounding erythema with areas of denuded epithelium. This is not really much different looking. Did not look like cellulitis this time however. Right foot area about the same.. We have been using silver alginate alginate on his toes Left buttock still substantial irritated skin around the wound which I think looks somewhat better. We have been using Hydrofera Blue here. 11/9; left foot larger than last week and a very necrotic surface. Right foot I think is about the same perhaps slightly smaller. Debris around the circumference also addressed. Unfortunately on the left buttock there is been a decline. Satellite lesions below the major wound distally and now a an additional one posteriorly we have been using Hydrofera Blue but I think this is a pressure issue 11/16; left foot ulcer dorsally again a very adherent necrotic surface. Right foot is about the same. Not much change in the pressure ulcer on his left buttock. 11/30; left foot ulcer dorsally basically the same as when I saw him 2 weeks ago. Very adherent fibrinous debris on the wound surface. Patient reports a lot  of  drainage as well. 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 We have been using Iodoflex, patient changed back to alginate because of the drainage. Area on his right dorsal foot really looks benign with a healthier surface certainly a lot better than on the left. Left buttock wounds all improved using Hydrofera Blue 12/7; left dorsal foot again no improvement. Tightly adherent debris. PCR culture I did last week only showed likely skin contaminant. I have gone ahead and done a punch biopsy of this which is about the last thing in terms of investigations I can think to do. He has known venous insufficiency and venous hypertension and this could be the issue here. The area on the right foot is about the same left buttock slightly worse according to our intake nurse secondary to [facility] Blue sticking to the wound 12/14; biopsy of the left foot that I did last time showed changes that could be related to wound healing/chronic stasis dermatitis phenomenon no neoplasm. We have been using silver alginate to both feet. I change the one on the left today to Sorbact and silver alginate to his other 2 wounds 12/28; the patient arrives with the following problems; Major issue is the dorsal left foot which continues to be a larger deeper wound area. Still with a completely nonviable surface Paradoxically the area mirror image on the right on the right dorsal foot appears to be getting better. He had some loss of dry denuded skin from the lower part of his original wound on the left lateral calf. Some of this area looked a little vulnerable and for this reason we put him in wrap that on this side this week The area on his left buttock is larger. He still has the erythematous circular area which I think is a combination of pressure, sweat. This does not look like cellulitis or fungal dermatitis 11/26/2019; -Dorsal left foot large open wound with depth. Still  debris over the surface. Using Sorbact The area on the dorsal right foot paradoxically has closed over He has a reopening on the left ankle laterally at the base of his original wound that extended up into the calf. This appears clean. The left buttock wound is smaller but with very adherent necrotic debris over the surface. We have been using silver alginate here as well The patient had arterial studies done in 2017. He had biphasic waveforms at the dorsalis pedis and posterior tibial bilaterally. ABI in the left was 1.17. Digit waveforms were dampened. He has slight spasticity in the great toes I do not think a TBI would be possible 1/11; the patient comes in today with a sizable reopening between the first and second toes on the right. This is not exactly in the same location where we have been treating wounds previously. According to our intake nurse this was actually fairly deep but 0.6 cm. The area on the left dorsal foot looks about the same the surface is somewhat cleaner using Sorbact, his MRI is in 2 days. We have not managed yet to get arterial studies. The new reopening on the left lateral calf looks somewhat better using alginate. The left buttock wound is about the same using alginate 1/18; the patient had his ARTERIAL studies which were quite normal. ABI in the right at 1.13 with triphasic/biphasic waveforms on the left ABI 1.06 again with triphasic/biphasic waveforms. It would not have been possible to have done a toe brachial index because of spasticity. We have  been using Sorbac to the left foot alginate to the rest of his wounds on the right foot left lateral calf and left buttock 1/25; arrives in clinic with erythema and swelling of the left forefoot worse over the first MTP area. This extends laterally dorsally and but also posteriorly. Still has an area on the left lateral part of the lower part of his calf wound it is eschared and clearly not closed. Area on the left buttock  still with surrounding irritation and erythema. Right foot surface wound dorsally. The area between the right and first and second toes appears better. 2/1; The left foot wound is about the same. Erythema slightly better I gave him a week of doxycycline empirically Right foot wound is more extensive extending between the toes to the plantar surface Left lateral calf really no open surface on the inferior part of his original wound however the entire area still looks vulnerable Absolutely no improvement in the left buttock wound required debridement. 2/8; the left foot is about the same. Erythema is slightly improved I gave him clindamycin last week. Right foot looks better he is using Lotrimin and silver alginate He has a breakdown in the left lateral calf. Denuded epithelium which I have removed Left buttock about the same were using Hydrofera Blue 2/15; left foot is about the same there is less surrounding erythema. Surface still has tightly adherent debris which I have debriding however not making any progress Right foot has a substantial wound on the medial right second toe between the first and second webspace. Still an open area on the left lateral calf distal area. Buttock wound is about the same 2/22; left foot is about the same less surrounding erythema. Surface has adherent debris. Polymen Ag Right foot area significant wound between the first and second toes. We have been using silver alginate here Left lateral leg polymen Ag at the base of his original venous insufficiency wound Left buttock some improvement here 3/1; Right foot is deteriorating in the first second toe webspace. Larger and more substantial. We have been using silver alginate. Left dorsal foot about the same markedly adherent surface debris using PolyMem Ag Left lateral calf surface debris using PolyMem AG Left buttock is improved again using PolyMem Ag. He is completing his terbinafine. The erythema in the foot  seems better. He has been on this for 2 weeks 3/8; no improvement in any wound area in fact he has a small open area on the dorsal midfoot which is new this week. He has not gotten his foot x-rays yet 3/15; his x-rays were both negative for osteomyelitis of both feet. No major change in any of his wounds on the extremities however his buttock wounds are better. We have been using polymen on the buttocks, left lower leg. Iodoflex on the left foot and silver alginate on the right 3/22; arrives in clinic today with the 2 major issues are the improvement in the left dorsal foot wound which for once actually looks healthy with a nice healthy wound surface without debridement. Using Iodoflex here. Unfortunately on the left lateral calf which is in the distal part of his original wound he came to the clinic here for there was purulent drainage noted some increased breakdown scattered around the original area and a small area proximally. We we are using polymen here will change to silver alginate today. His buttock wound on the left is better and I think the area on the right first second toe webspace is  also improved 3/29; left dorsal foot looks better. Using Iodoflex. Left ankle culture from deterioration last time grew E. coli, Enterobacter and Enterococcus. I will give him a course of cefdinir although that will not cover Enterococcus. The area on the right foot in the webspace of the first and second toe lateral first toe looks better. The area on his buttock is about healed Vascular appointment is on April 21. This is to look at his venous system vis--vis continued breakdown of the wounds on the left including the left lateral leg and left dorsal foot he. He has had previous ablations on this side 4/5; the area between the right first and second toes lateral aspect of the first toe looks better. Dorsal aspect of the left first toe on the left foot also improved. Unfortunately the left lateral lower leg  is larger and there is a second satellite wound superiorly. The usual superficial abrasions on the left buttock overall better but certainly not closed 4/12; the area between the right first and second toes is improved. Dorsal aspect of the left foot also slightly smaller with a vibrant healthy looking surface. No real change in the left lateral leg and the left buttock wound is healed He has an unaffordable co-pay for Apligraf. Appointment with vein and vascular with regards to the left leg venous part of the circulation is on 4/21 4/19; we continue to see improvement in all wound areas. Although this is minor. He has his vascular appointment on 4/21. The area on the left buttock has not reopened although right in the center of this area the skin looks somewhat threatened 4/26; the left buttock is unfortunately reopened. In general his left dorsal foot has a healthy surface and looks somewhat smaller although it was not measured as such. The area between his first and second toe webspace on the right as a small wound against the first toe. The patient saw vascular surgery. The real question I was asking was about the small saphenous vein on the left. He has previously ablated left greater saphenous vein. Nothing further was commented on on the left. Right greater saphenous vein without reflux at the saphenofemoral junction or proximal thigh there was no indication for ablation of the right greater saphenous vein duplex was negative for DVT bilaterally. They did not think there was anything from a vascular surgery point of view that could be offered. They ABIs within normal limits 5/3; only small open area on the left buttock. The area on the left lateral leg which was his original venous reflux is now 2 wounds both which look clean. We are using Iodoflex on the left dorsal foot which looks healthy and smaller. He is down to a very tiny area between the right first and second toes, using  silver alginate 5/10; all of his wounds appear better. We have much better edema control in 4 layer compression on the left. This may be the factor that is allowing the left foot and left lateral calf to heal. He has external compression garments at home 04/14/20-All of his wounds are progressing well, the left forefoot is practically closed, left ischium appears to be about the same, right toe webspace is also smaller. The left lateral leg is about the same, continue using Hydrofera Blue to this, silver alginate to the ischium, Iodoflex to the toe space on the right 6/7; most of his wounds outside of the left buttock are doing well. The area on the left lateral calf and left  dorsal foot are smaller. The area on the right foot in between the first and second toe webspace is barely visible although he still says there is some drainage here is the only reason I did not heal this out. Unfortunately the area on the left buttock almost looks like he has a skin tear from tape. He has open wound and then a large flap of skin that we are trying to get adherence over an area just next to the remaining wound 6/21; 2 week follow-up. I believe is been here for nurse visits. Miraculously the area between his first and second toes on the left dorsal foot is closed over. Still open on the right first second web space. The left lateral calf has 2 open areas. Distally this is more superficial. The proximal area had a little more depth and required debridement of adherent necrotic material. His buttock wound is actually larger we have been using silver alginate here 6/28; the patient's area on the left foot remains closed. Still open wet area between the first and second toes on the right and also extending into the plantar aspect. We have been using silver alginate in this location. He has 2 areas on the left lower leg part of his original long wounds which I think are better. We have been using Hydrofera Blue here.  Hydrofera Blue to the left buttock which is stable 7/12; left foot remains closed. Left ankle is closed. May be a small area between his right first and second toes the only truly open area is on the left buttock. We have been using Hydrofera Blue here 7/19; patient arrives with marked deterioration especially in the left foot and ankle. We did not put him in a compression wrap on the left last week in fact he wore his juxta lite stockings on either side although he does not have an underlying stocking. He has a reopening on the left dorsal foot, left lateral ankle and a new area on the right dorsal ankle. More worrisome is the degree of erythema on the left foot extending on the lateral foot into the lateral lower leg on the left 7/26; the patient had erythema and drainage from the lateral left ankle last week. Culture of this grew MRSA resistant to doxycycline and clindamycin which are the 2 antibiotics we usually use with this patient who has multiple antibiotic allergies including linezolid, trimethoprim sulfamethoxazole. I had give him an empiric doxycycline and he comes in the area certainly looks somewhat better although it is blotchy in his lower leg. He has not been systemically unwell. He has had areas on the left dorsal foot which is a reopening, chronic wounds on the left lateral ankle. Both of these I think are secondary to chronic venous insufficiency. The area between his first and second toes is closed as far as I can tell. He had a new wrap injury on the right dorsal ankle last week. Finally he has an area on the left buttock. We have been using silver alginate to everything except the left buttock we are using Hydrofera Blue 06/30/20-Patient returns at 1 week, has been given a sample dose pack of NUZYRA which is a tetracycline derivative [omadacycline], patient has completed those, we have been using silver alginate to almost all the wounds except the left ischium where we are using  Hydrofera Blue all of them look better 8/16; since I last saw the patient he has been doing well. The area on the left buttock, left lateral  ankle and left foot are all closed today. He has completed the [place] I gave him last time and tolerated this well. He still has open areas on the right dorsal ankle and in the right first second toe area which we are using silver alginate. 8/23; we put him in his bilateral external compression stockings last week as he did not have anything open on either leg except for concerning area between the right first and second toe. He comes in today with an area on the left dorsal foot slightly more proximal than the original wound, the left lateral foot but this is actually a continuation of the area he had on the left lateral ankle from last time. As well he is opened up on the left buttock again. 8/30; comes in today with things looking a lot better. The area on the left lower ankle has closed down as has the left foot but with eschar in both areas. The area on the dorsal right ankle is also epithelialized. Very little remaining of the left buttock wound. We have been using silver alginate on all wound areas 9/13; the area in the first second toe webspace on the right has fully epithelialized. He still has some vulnerable epithelium on the right and the ankle and the dorsal foot. He notes weeping. He is using his juxta lite stocking. On the left again the left dorsal foot is closed left lateral ankle is closed. We went to the juxta lite stocking here as well. Still vulnerable in the left buttock although only 2 small open areas remain here 9/27; 2-week follow-up. We did not look at his left leg but the patient says everything is closed. He is a bit disturbed by the amount of edema in his left foot he is using juxta lite stockings but asking about over the toes stockings which would be 30/40, will talk to him next time. According to him there is no open wound on  either the left foot or the left ankle/calf He has an open area on the dorsal right calf which I initially point a wrap injury. He has superficial remaining wound on the left ischial tuberosity been using silver alginate although he says this sticks to the wound 10/5; we gave him 2-week follow-up but he called yesterday expressing some concerns about his right foot right ankle and the left buttock. He came in early. There is still no open areas on the left leg and that still in his juxta lite stocking 10/11; he only has 1 small area on the left buttock that remains measuring millimeters 1 mm. Still has the same irritated skin in this area. We recommended zinc oxide when this eventually closes and pressure relief is meticulously is he can do this. He still has an area on the dorsal part of his right first through third toes which is a bit irritated and still open and on the dorsal ankle near the crease of the ankle. We have been using silver alginate and using his own stocking. He has nothing open on the left leg or foot 10/25; 2-week follow-up. Not nearly as good on the left buttock as I was hoping. For open areas with 5 looking threatened small. He has the erythematous irritated chronic skin in this area. 1 area on the right dorsal ankle. He reports this area bleeds easily Right dorsal foot just proximal to the base of his toes We have been using silver alginate. 11/8; 2-week follow-up. Left buttock is about the same  although I do not think the wounds are in the same location we have been using silver alginate. I have asked him to use zinc oxide on the skin around the wounds. He still has a small area on the right dorsal ankle he reports this bleeds easily Right dorsal foot just proximal to the base of the toes does not have anything open although the skin is very dry and scaly He has a new opening on the nailbed of the left great toe. Nothing on the left ankle 11/29; 3-week follow-up. Left  buttock has 2 open areas. And washing of these wounds today started bleeding easily. Suggesting very friable tissue. We have been using silver alginate. Right dorsal ankle which I thought was initially a wrap injury we have been using silver alginate. Nothing open between the toes that I can see. He states the area on the left dorsal toe nailbed healed after the last visit in 2 or 3 days 12/13; 3-week follow-up. His left buttock now has 3 open areas but the original 2 areas are smaller using polymen here. Surrounding skin looks better. The right dorsal ankle is closed. He has a small opening on the right dorsal foot at the level of the third toe. In general the skin looks better here. He is wearing his juxta lite stocking on the left leg says there is nothing open 11/24/2020; 3 weeks follow-up. His left buttock still has the 3 open areas. We have been using polymen but due to lack of response he changed to [facility] area. Surrounding skin is dry erythematous and irritated looking. There is no evidence of infection either bacterial or fungal however there is loss of surface epithelium He still has very dry skin in his foot causing irritation and erythema on the dorsal part of his toes. This is not responded to prolonged courses of antifungal simply looks dry and irritated 1/24; left buttock area still looks about the same he was unable to find the triad ointment that we had suggested. The area on the right lower leg just above the dorsal ankle has reopened and the areas on the right foot between the first second and second third toes and scaling on the bottom of the foot has been about the same for quite some time now. been using silver alginate to all wound areas Electronic Signature(s) Signed: 12/15/2020 5:37:42 PM By: Linton Ham MD Entered By: Linton Ham on 12/15/2020 08:37:54 -------------------------------------------------------------------------------- Physical Exam  Details Patient Name: Date of Service: Bresnan, [name] 12/15/2020 8:00 A M Medical Record Number: [id number] Patient Account Number: [id number] Date of Birth/Sex: Treating RN: [birth year] (35 y.o. [name] Primary Care Provider: [place], [place] Other Clinician: Referring Provider: Treating Provider/Extender: [name]: 258 Constitutional Sitting or standing Blood Pressure is within target range for patient.. Pulse regular and within target range for patient.Marland Kitchen Respirations regular, non-labored and within target range.. Temperature is normal and within the target range for the patient.Marland Kitchen Appears in no distress. Notes Wound exam Left buttock probably not a lot better. The wound areas look healthy in terms of surface. Still the dry erythematous scaly skin around this Electronic Signature(s) Signed: 12/15/2020 5:37:42 PM By: Linton Ham MD Entered By: Linton Ham on 12/15/2020 08:38:47 -------------------------------------------------------------------------------- Physician Orders Details Patient Name: Date of Service: Harney, [name] 12/15/2020 8:00 A M Medical Record Number: [id number] Patient Account Number: [id number] Date of Birth/Sex: Treating RN: 12/01/1985 (35 y.o. [name] Primary Care Provider: Other  Clinician: Janine Limbo Referring Provider: Treating Provider/Extender: [name]: [phone] Verbal / Phone Orders: No Diagnosis Coding ICD-10 Coding Code Description I87.332 Chronic venous hypertension (idiopathic) with ulcer and inflammation of left lower extremity L97.321 Non-pressure chronic ulcer of left ankle limited to breakdown of skin L97.311 Non-pressure chronic ulcer of right ankle limited to breakdown of skin L97.511 Non-pressure chronic ulcer of other part of right foot limited to breakdown of skin L97.521 Non-pressure chronic ulcer of other part of left foot limited to  breakdown of 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 Bathing/ Shower/ Hygiene May shower and wash wound with soap and water. - on days that dressing is changed Edema Control - Lymphedema / SCD / Other Elevate legs to the level of the heart or above for 30 minutes daily and/or when sitting, a frequency of: - throughout the day Compression stocking or Garment 30-40 mm/Hg pressure to: - Juxtalite to both legs daily Off-Loading Roho cushion for wheelchair Turn and reposition every 2 hours Wound Treatment Wound #38R - T - Web between 1st and 2nd oe Wound Laterality: Right Cleanser: Soap and Water Every Other Day/30 Days Discharge Instructions: May shower and wash wound with dial antibacterial soap and water prior to dressing change. Peri-Wound Care: Sween Lotion (Moisturizing lotion) Every Other Day/30 Days Discharge Instructions: Apply moisturizing lotion as directed Prim Dressing: KerraCel Ag Gelling Fiber Dressing, 2x2 in (silver alginate) Every Other Day/30 Days ary Discharge Instructions: Apply silver alginate to wound bed as instructed Secondary Dressing: Woven Gauze Sponge, Non-Sterile 4x4 in Every Other Day/30 Days Discharge Instructions: Apply over primary dressing as directed. Secured With: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 improving. I debrided this every 2 weeks with #5 curet and we have been using Hydrofera Blue and again making good progress With regards to the wounds between his right first and second toe which I thought might of been tinea pedis he is not making as much progress very dry scaly skin over the area. Also the area at the base of the left first and second toe in a similar condition 05/12/17; continued gradual improvement in the refractory left lateral venous insufficiency wound on the left. Dimension smaller. Surface still requiring debridement using Hydrofera Blue 05/19/17; continued gradual improvement in the refractory left lateral venous ulceration. Careful inspection of the wound bed underlying rumination suggested some degree of epithelialization over the surface no debridement indicated. Continue Hydrofera Blue difficult areas between his toes first and third on the left than first and second on the right. I'm going to change to silver alginate from silver collagen. Continue ketoconazole as I suspect underlying tinea pedis 05/26/17; left lateral leg venous insufficiency wound. We've been using Hydrofera Blue. I believe that there is expanding epithelialization over the surface of the wound albeit not coming from the wound circumference. This is a bit of an odd situation in which the epithelialization seems to be coming from the surface of the wound rather than in the exact circumference. There is still small open areas mostly along the lateral margin of the wound. ooHe has unchanged areas between the left first and second and the right first second toes which I been treating for tenia pedis 06/02/17; left lateral leg venous insufficiency  wound. We have been using Hydrofera Blue. Somewhat smaller from the wound circumference. The surface of the wound remains a bit on it almost epithelialized sedation in appearance. I use an open curette today debridement in the surface of all of this especially the edges ooSmall open wounds remaining on the dorsal right first and second toe interspace and the plantar left first second toe and her face on the left 06/09/17; wound on the left lateral leg continues to be smaller but very gradual and very dry surface using Hydrofera Blue 06/16/17 requires weekly debridements now on the left lateral leg although this continues to contract. I changed to silver collagen last week because of dryness of the wound bed. Using Iodoflex to the areas on his first and second toes/web space bilaterally 06/24/17; patient with history of paraplegia also chronic venous insufficiency with lymphedema. Has a very difficult wound on the left lateral leg. This has been gradually reducing in terms of with but comes in with a very dry adherent surface. High switch to silver collagen a week or so ago with hydrogel to keep the area moist. This is been refractory to multiple dressing attempts. He also has areas in his first and second toes bilaterally in the anterior and posterior web space. I had been using Iodoflex here after a prolonged course of silver alginate with ketoconazole was ineffective [question tinea pedis] 07/14/17; patient arrives today with a very  difficult adherent material over his left lateral lower leg wound. He also has surrounding erythema and poorly controlled edema. He was switched his Santyl last visit which the nurses are applying once during his doctor visit and once on a nurse visit. He was also reduced to 2 layer compression I'm not exactly sure of the issue here. 07/21/17; better surface today after 1 week of Iodoflex. Significant cellulitis that we treated last week also better. [Doxycycline] 07/28/17  better surface today with now 2 weeks of Iodoflex. Significant cellulitis treated with doxycycline. He has now completed the doxycycline and he is back to his usual degree of chronic venous inflammation/stasis dermatitis. He reminds me he has had ablations surgery here 08/04/17; continued improvement with Iodoflex to the left lateral leg wound in terms of the surface of the wound although the dimensions are better. 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 Problematic areas on the plantar aspect of the first second toe web space on the left and the dorsal aspect of the first second toe web space on the right. At one point I felt these were probably related to chronic fungal infections in treated him aggressively for this although we have not made any improvement here. 08/11/17; left lateral leg. Surface continues to improve with the Iodoflex although we are not seeing much improvement in overall wound dimensions. Areas on his plantar left foot and right foot show no improvement. In fact the right foot looks somewhat worse 08/18/17; left lateral leg. We changed to [facility] Blue last week after a prolonged course of Iodoflex which helps get the surface better. It appears that the wound with is improved. Continue with difficult areas on the left dorsal first second and plantar first second on the right 09/01/17; patient arrives in clinic today having had a temperature of 103 yesterday. He was seen in the ER and [facility]. The patient was concerned he could have cellulitis again in the right leg however they diagnosed him with a UTI and he is now on Keflex. He has a history of cellulitis which is been recurrent and difficult but this is been in the left leg, in the past 5 use doxycycline. He does in and out catheterizations at home which are risk factors for UTI 09/08/17; patient will be completing his Keflex this weekend. The erythema on  the left leg is considerably better. He has a new wound today on the medial part of the right leg small superficial almost looks like a skin tear. He has worsening of the area on the right dorsal first and second toe. His major area on the left lateral leg is better. Using Hydrofera Blue on all areas 09/15/17; gradual reduction in width on the long wound in the left lateral leg. No debridement required. He also has wounds on the plantar aspect of his left first second toe web space and on the dorsal aspect of the right first second toe web space. 09/22/17; there continues to be very gradual improvements in the dimensions of the left lateral leg wound. He hasn't round erythematous spot with might be pressure on his wheelchair. There is no evidence obviously of infection no purulence no warmth ooHe has a dry scaled area on the plantar aspect of the left first second toe ooImproved area on the dorsal right first second toe. 09/29/17; left lateral leg wound continues to improve in dimensions mostly with an is still a fairly long but increasingly narrow wound.   ooHe has a dry scaled area on the plantar aspect of his left first second toe web space ooIncreasingly concerning area on the dorsal right first second toe. In fact I am concerned today about possible cellulitis around this wound. The areas extending up his second toe and although there is deformities here almost appears to abut on the nailbed. 10/06/17; left lateral leg wound continues to make very gradual progress. Tissue culture I did from the right first second toe dorsal foot last time grew MRSA and enterococcus which was vancomycin sensitive. This was not sensitive to clindamycin or doxycycline. He is allergic to Zyvox and sulfa we have therefore arrange for him to have dalvance infusion tomorrow. He is had this in the past and tolerated it well 10/20/17; left lateral leg wound continues to make decent progress. This is certainly reduced in  terms of with there is advancing epithelialization.ooThe cellulitis in the right foot looks better although he still has a deep wound in the dorsal aspect of the first second toe web space. Plantar left first toe web space on the left I think is making some progress 10/27/17; left lateral leg wound continues to make decent progress. Advancing epithelialization.using Hydrofera Blue ooThe right first second toe web space wound is better-looking using silver alginate ooImprovement in the left plantar first second toe web space. Again using silver alginate 11/03/17 left lateral leg wound continues to make decent progress albeit slowly. Using Hydrofera Blue ooThe right per second toe web space continues to be a very problematic looking punched out wound. I obtained a piece of tissue for deep culture I did extensively treated this for fungus. It is difficult to imagine that this is a pressure area as the patient states other than going outside he doesn't really wear shoes at home ooThe left plantar first second toe web space looked fairly senescent. Necrotic edges. This required debridement oochange to Hydrofera Blue to all wound areas 11/10/17; left lateral leg wound continues to contract. Using Hydrofera Blue ooOn the right dorsal first second toe web space dorsally. Culture I did of this area last week grew MRSA there is not an easy oral option in this patient was multiple antibiotic allergies or intolerances. This was only a rare culture isolate I'm therefore going to use Bactroban under silver alginate ooOn the left plantar first second toe web space. Debridement is required here. This is also unchanged 11/17/17; left lateral leg wound continues to contract using Hydrofera Blue this is no longer the major issue. ooThe major concern here is the right first second toe web space. He now has an open area going from dorsally to the plantar aspect. There is now wound on the inner lateral part of  the first toe. Not a very viable surface on this. There is erythema spreading medially into the forefoot. ooNo major change in the left first second toe plantar wound 11/24/17; left lateral leg wound continues to contract using Hydrofera Blue. Nice improvement today ooThe right first second toe web space all of this looks a lot less angry than last week. I have given him clindamycin and topical Bactroban for MRSA and terbinafine for the possibility of underlining tinea pedis that I could not control with ketoconazole. Looks somewhat better ooThe area on the plantar left first second toe web space is weeping with dried debris around the wound 12/01/17; left lateral leg wound continues to contract he Hydrofera Blue. It is becoming thinner in terms of with nevertheless it is making good improvement.   ooThe right first second toe web space looks less angry but still a large necrotic-looking wounds starting on the plantar aspect of the right foot extending between the toes and now extensively on the base of the right second toe. I gave him clindamycin and topical Bactroban for MRSA anterior benefiting for the possibility of underlying tinea pedis. Not looking better today ooThe area on the left first/second toe looks better. Debrided of necrotic debris 12/05/17* the patient was worked in urgently today because over the weekend he found blood on his incontinence bad when he woke up. He was found to have an ulcer by his wife who does most of his wound care. He came in today for [place] to look at this. He has not had a history of wounds in his buttocks in spite of his paraplegia. 12/08/17; seen in follow-up today at his usual appointment. He was seen earlier this week and found to have a new wound on his buttock. We also follow him for wounds on the left lateral leg, left first second toe web space and right first second toe web space 12/15/17; we have been using Hydrofera Blue to the left lateral leg which has  improved. The right first second toe web space has also improved. Left first second toe web space plantar aspect looks stable. The left buttock has worsened using Santyl. Apparently the buttock has drainage 12/22/17; we have been using Hydrofera Blue to the left lateral leg which continues to improve now 2 small wounds separated by normal skin. He tells [place] he had a fever up to 100 yesterday he is prone to UTIs but has not noted anything different. He does in and out catheterizations. The area between the first and second toes today does not look good necrotic surface covered with what looks to be purulent drainage and erythema extending into the third toe. I had gotten this to something that I thought look better last time however it is not look good today. He also has a necrotic surface over the buttock wound which is expanded. I thought there might be infection under here so I removed a lot of the surface with a #5 curet though nothing look like it really needed culturing. He is been using Santyl to this area 12/27/17; his original wound on the left lateral leg continues to improve using Hydrofera Blue. I gave him samples of Baxdella although he was unable to take them out of fear for an allergic reaction ["lump in his throat"].the culture I did of the purulent drainage from his second toe last week showed both enterococcus and a set Enterobacter I was also concerned about the erythema on the bottom of his foot although paradoxically although this looks somewhat better today. 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 We've been using silver alginate here. They came up today that he sleeps in a recliner, I'm not sure why but I asked him to stop this 01/03/18; his original wound we've been using Hydrofera Blue is now separated into 2 areas. ooUlcer on his left buttock is better he is off the recliner and  sleeping in bed ooFinally both wound areas between his first and second toes also looks some better 01/10/18; his original wound on the left lateral leg is now separated into 2 wounds we've been using Hydrofera Blue ooUlcer on his left buttock has some drainage. There is a small probing site going into  muscle layer superiorly.using silver alginate -He arrives today with a deep tissue injury on the left heel ooThe wound on the dorsal aspect of his first second toe on the left looks a lot betterusing silver alginate ketoconazole ooThe area on the first second toe web space on the right also looks a lot bette 01/17/18; his original wound on the left lateral leg continues to progress using Hydrofera Blue ooUlcer on his left buttock also is smaller surface healthier except for a small probing site going into the muscle layer superiorly. 2.4 cm of tunneling in this area ooDTI on his left heel we have only been offloading. Looks better than last week no threatened open no evidence of infection oothe wound on the dorsal aspect of the first second toe on the left continues to look like it's regressing we have only been using silver alginate and terbinafine orally ooThe area in the first second toe web space on the right also looks to be a lot better using silver alginate and terbinafine I think this was prompted by tinea pedis 01/31/18; the patient was hospitalized in [place] last week apparently for a complicated UTI. He was discharged on cefepime he does in and out catheterizations. In the hospital he was discovered M I don't mild elevation of AST and ALT and the terbinafine was stopped.predictably the pressure ulcer on s his buttock looks betterusing silver alginate. The area on the left lateral leg also is better using Hydrofera Blue. The area between the first and second toes on the left better. First and second toes on the right still substantial but better. Finally the DTI on the left heel has  held together and looks like it's resolving 02/07/18-he is here in follow-up evaluation for multiple ulcerations. He has new injury to the lateral aspect of the last issue a pressure ulcer, he states this is from adhesive removal trauma. He states he has tried multiple adhesive products with no success. All other ulcers appear stable. The left heel DTI is resolving. We will continue with same treatment plan and follow-up next week. 02/14/18; follow-up for multiple areas. ooHe has a new area last week on the lateral aspect of his pressure ulcer more over the posterior trochanter. The original pressure ulcer looks quite stable has healthy granulation. 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 Almost fully epithelialized on the original superior area using Hydrofera Blue ooDTI on the left heel has peeled off this week to reveal a small superficial wound under denuded skin and subcutaneous tissue ooBoth areas between the first and second toes look better including nothing open on the left 02/21/18; ooThe patient's wounds on his left ischial tuberosity and posterior left greater trochanter actually looked better. He has a large area of irritation around the area which I think is contact dermatitis. I am doubtful that this is fungal ooHis original wound on the left lateral calf continues to improve we have been using Hydrofera Blue ooThere is no open area in the left first second toe web space although there is a lot of thick callus ooThe DTI on the left heel required debridement today of necrotic surface eschar and subcutaneous tissue using silver alginate ooFinally the area on the right first second toe webspace continues to contract using silver alginate and ketoconazole 02/28/18 ooLeft ischial tuberosity wounds look better using silver alginate. ooOriginal wound on the left calf only has  one small open  area left using Hydrofera Blue ooDTI on the left heel required debridement mostly removing skin from around this wound surface. Using silver alginate ooThe areas on the right first/second toe web space using silver alginate and ketoconazole 03/08/18 on evaluation today patient appears to be doing decently well as best I can tell in regard to his wounds. This is the first time that I have seen him as he generally is followed by Dr. Dellia Nims. With that being said none of his wounds appear to be infected he does have an area where there is some skin covering what appears to be a new wound on the left dorsal surface of his great toe. This is right at the nail bed. With that being said I do believe that debrided away some of the excess skin can be of benefit in this regard. Otherwise he has been tolerating the dressing changes without complication. 03/14/18; patient arrives today with the multiplicity of wounds that we are following. He has not been systemically unwell ooOriginal wound on the left lateral calf now only has 2 small open areas we've been using Hydrofera Blue which should continue ooThe deep tissue injury on the left heel requires debridement today. We've been using silver alginate ooThe left first second toe and the right first second toe are both are reminiscence what I think was tinea pedis. Apparently some of the callus Surface between the toes was removed last week when it started draining. ooPurulent drainage coming from the wound on the ischial tuberosity on the left. 03/21/18-He is here in follow-up evaluation for multiple wounds. 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 He tolerated debridement. The only change to last week's recommendations is to discontinue antifungal cream between toes. He will follow-up next week 03/28/18; following up for multiple wounds;Concern this week is streaking  redness and swelling in the right foot. He is going to need antibiotics for this. 03/31/18; follow-up for right foot cellulitis. Streaking redness and swelling in the right foot on 03/28/18. 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 I put him on clindamycin 300 mg every 6 and brought him in for a quick check. He has an open wound between his first and second toes on the right foot as a potential source. 04/04/18; ooRight foot cellulitis is resolving he is completing clindamycin. This is truly good news ooLeft lateral calf wound which is initial wound only has one small open area inferiorly this is close to healing out. He has compression stockings. We will use Hydrofera Blue right down to the epithelialization of this ooNonviable surface on the left heel which was initially pressure with a DTI. We've been using Hydrofera Blue. I'm going to switch this back to silver alginate ooLeft first second toe/tinea pedis this looks better using silver alginate ooRight first second toe tinea pedis using silver alginate ooLarge pressure ulcers on theLeft ischial tuberosity. Small wound here Looks better. I am uncertain about the surface over the large wound. Using silver alginate 04/11/18; ooCellulitis in the right foot is resolved ooLeft lateral calf wound which was his original wounds still has 2 tiny open areas remaining this is just about closed ooNonviable surface on the left heel is better but still requires debridement ooLeft first second toe/tinea pedis still open using silver alginate ooRight first second toe wound tinea pedis I asked him to go back to using ketoconazole and silver alginate ooLarge pressure ulcers on the left ischial tuberosity this  shear injury here is resolved. Wound is smaller. No evidence of infection using silver alginate 04/18/18; ooPatient arrives with an intense area of cellulitis in the right mid lower calf extending into the right heel area.  Bright red and warm. Smaller area on the left anterior leg. He has a significant history of MRSA. He will definitely need antibioticsoodoxycycline ooHe now has 2 open areas on the left ischial tuberosity the original large wound and now a satellite area which I think was above his initial satellite areas. Not a wonderful surface on this satellite area surrounding erythema which looks like pressure related. ooHis left lateral calf wound again his original wound is just about closed ooLeft heel pressure injury still requiring debridement ooLeft first second toe looks a lot better using silver alginate ooRight first second toe also using silver alginate and ketoconazole cream also looks better 04/20/18; the patient was worked in early today out of concerns with his cellulitis on the right leg. I had started him on doxycycline. This was 2 days ago. His wife was concerned about the swelling in the area. Also concerned about the left buttock. He has not been systemically unwell no fever chills. No nausea vomiting or diarrhea 04/25/18; the patient's left buttock wound is continued to deteriorate he is using Hydrofera Blue. He is still completing clindamycin for the cellulitis on the right leg although all of this looks better. 05/02/18 ooLeft buttock wound still with a lot of drainage and a very tightly adherent fibrinous necrotic surface. He has a deeper area superiorly ooThe left lateral calf wound is still closed ooDTI wound on the left heel necrotic surface especially the circumference using Iodoflex ooAreas between his left first second toe and right first second toe both look better. Dorsally and the right first second toe he had a necrotic surface although at smaller. In using silver alginate and ketoconazole. I did a culture last week which was a deep tissue culture of the reminiscence of the open wound on the right first second toe dorsally. This grew a few Acinetobacter and a few  methicillin-resistant staph aureus. Nevertheless the area actually this week looked better. I didn't feel the need to specifically address this at least in terms of systemic antibiotics. 05/09/18; wounds are measuring larger more drainage per our intake. We are using Santyl covered with alginate on the large superficial buttock wounds, Iodosorb on the left heel, ketoconazole and silver alginate to the dorsal first and second toes bilaterally. 05/16/18; ooThe area on his left buttock better in some aspects although the area superiorly over the ischial tuberosity required an extensive debridement.using Santyl ooLeft heel appears stable. Using Iodoflex ooThe areas between his first and second toes are not bad however there is spreading erythema up the dorsal aspect of his left foot this looks like cellulitis again. He is insensate the erythema is really very brilliant.o Erysipelas He went to see an allergist days ago because he was itching part of this he had lab work done. This showed a white count of 15.1 with 70% neutrophils. Hemoglobin of 11.4 and a platelet count of 659,000. Last white count we had in [facility] was a 2-1/2 years ago which was 25.9 but he was ill at the time. He was able to show me some lab work that was done by his primary physician the pattern is about the same. 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 But prompted me to go ahead and do  x-rays of both feet and the pelvis rule out osteomyelitis. He also had a comprehensive metabolic panel this was reasonably normal his albumin was 3.7 liver function tests BUN/creatinine all normal 05/23/18; x-rays of both his feet from last week were negative for underlying pulmonary abnormality. The x-ray of his pelvis however showed mild irregularity in the left ischial which may represent some early osteomyelitis. The wound in the left ischial continues to get deeper clearly now exposed muscle. Each  week necrotic surface material over this area. Whereas the rest of the wounds do not look so bad. ooThe left ischial wound we have been using Santyl and calcium alginate ooT the left heel surface necrotic debris using Iodoflex o ooThe left lateral leg is still healed ooAreas on the left dorsal foot and the right dorsal foot are about the same. There is some inflammation on the left which might represent contact dermatitis, fungal dermatitis I am doubtful cellulitis although this looks better than last week 05/30/18; CT scan done at Hospital did not show any osteomyelitis or abscess. Suggested the possibility of underlying cellulitis although I don't see a lot of evidence of this at the bedside ooThe wound itself on the left buttock/upper thigh actually looks somewhat better. No debridement ooLeft heel also looks better no debridement continue Iodoflex ooBoth dorsal first second toe spaces appear better using Lotrisone. Left still required debridement 06/06/18; ooIntake reported some purulent looking drainage from the left gluteal wound. Using Santyl and calcium alginate ooLeft heel looks better although still a nonviable surface requiring debridement ooThe left dorsal foot first/second webspace actually expanding and somewhat deeper. I may consider doing a shave biopsy of this area ooRight dorsal foot first/second webspace appears stable to improved. Using Lotrisone and silver alginate to both these areas 06/13/18 ooLeft gluteal surface looks better. Now separated in the 2 wounds. No debridement required. Still drainage. We'll continue silver alginate ooLeft heel continues to look better with Iodoflex continue this for at least another week ooOf his dorsal foot wounds the area on the left still has some depth although it looks better than last week. We've been using Lotrisone and silver alginate 06/20/18 ooLeft gluteal continues to look better healthy tissue ooLeft heel continues  to look better healthy granulation wound is smaller. He is using Iodoflex and his long as this continues continue the Iodoflex ooDorsal right foot looks better unfortunately dorsal left foot does not. There is swelling and erythema of his forefoot. He had minor trauma to this several days ago but doesn't think this was enough to have caused any tissue injury. Foot looks like cellulitis, we have had this problem before 06/27/18 on evaluation today patient appears to be doing a little worse in regard to his foot ulcer. 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 Both of which would really be his only options for treating this infection. In the past he has been given and effusion of Orbactiv. This is done very well for him in the past again it's one time dosing IV antibiotic therapy. Subsequently I do believe this is something we're gonna need to see about doing at this point in time. Currently his other wounds seem to be doing somewhat better in my pinion I'm pretty happy in that regard. 07/03/18 on evaluation today patient's wounds actually appear to be doing fairly well. He has been tolerating the dressing  changes without complication. All in all he seems to be showing signs of improvement. In regard to the antibiotics he has been dealing with infectious disease since I saw him last week as far as getting this scheduled. In the end [facility]'s [facility] this coming Friday. In the meantime he has been continuing to perform the dressing changes in such as previous. There does not appear to be any evidence of infection worsengin at this time. 07/10/18; ooSince I last saw this man 2 weeks ago things have actually improved. IV antibiotics of resulted in less forefoot erythema although there is still some present. He is  not systemically unwell ooLeft buttock wounds o2 now have no depth there is increased epithelialization Using silver alginate ooLeft heel still requires debridement using Iodoflex ooLeft dorsal foot still with a sizable wound about the size of a border but healthy granulation ooRight dorsal foot still with a slitlike area using silver alginate 07/18/18; the patient's cellulitis in the left foot is improved in fact I think it is on its way to resolving. ooLeft buttock wounds o2 both look better although the larger one has hypertension granulation we've been using silver alginate ooLeft heel has some thick circumferential redundant skin over the wound edge which will need to be removed today we've been using Iodoflex ooLeft dorsal foot is still a sizable wound required debridement using silver alginate ooThe right dorsal foot is just about closed only a small open area remains here 07/25/18; left foot cellulitis is resolved ooLeft buttock wounds o2 both look better. Hyper-granulation on the major area ooLeft heel as some debris over the surface but otherwise looks a healthier wound. Using silver collagen ooRight dorsal foot is just about closed 07/31/18; arrives with our intake nurse worried about purulent drainage from the buttock. We had hyper-granulation here last week ooHis buttock wounds o2 continue to look better ooLeft heel some debris over the surface but measuring smaller. ooRight dorsal foot unfortunately has openings between the toes ooLeft foot superficial wound looks less aggravated. 08/07/18 ooButtock wounds continue to look better although some of her granulation and the larger medial wound. silver alginate ooLeft heel continues to look a lot better.silver collagen ooLeft foot superficial wound looks less stable. Requires debridement. He has a new wound superficial area on the foot on the lateral dorsal foot. ooRight foot looks better using silver alginate without  Lotrisone 08/14/2018; patient was in the ER last week diagnosed with a UTI. He is now on Cefpodoxime and Macrodantin. ooButtock wounds continued to be smaller. Using silver alginate ooLeft heel continues to look better using silver collagen ooLeft foot superficial wound looks as though it is improving ooRight dorsal foot area is just about healed. 08/21/2018; patient is completed his antibiotics for his UTI. ooHe has 2 open areas on the buttocks. There is still not closed although the surface looks satisfactory. Using silver alginate ooLeft heel continues to improve using silver collagen ooThe bilateral dorsal foot areas which are at the base of his first and second toes/possible tinea pedis are actually stable on the left but worse on the right. The area on the left required debridement of necrotic surface. After debridement I obtained a specimen for PCR culture. ooThe right dorsal foot which is been just about healed last week is now reopened 08/28/2018; culture done on the left dorsal foot showed coag negative staph both staph epidermidis and Lugdunensis. I think this is worthwhile initiating  systemic treatment. I will use doxycycline given his long list of allergies. The area on the left heel slightly improved but still requiring debridement. ooThe large wound on the buttock is just about closed whereas the smaller one is larger. Using silver alginate in this area 09/04/2018; patient is completing his doxycycline for the left foot although this continues to be a very difficult wound area with very adherent necrotic debris. We are using silver alginate to all his wounds right foot left foot and the small wounds on his buttock, silver collagen on the left heel. 09/11/2018; once again this patient has intense erythema and swelling of the left forefoot. Lesser degrees of erythema in the right foot. He has a long list of allergies and intolerances. I will reinstitute doxycycline. oo2 small  areas on the left buttock are all the left of his major stage III pressure ulcer. Using silver alginate ooLeft heel also looks better using silver collagen ooUnfortunately both the areas on his feet look worse. The area on the left first second webspace is now gone through to the plantar part of his foot. The area on the left foot anteriorly is irritated with erythema and swelling in the forefoot. 09/25/2018 ooHis wound on the left plantar heel looks better. Using silver collagen ooThe area on the left buttock 2 small remnant areas. One is closed one is still open. Using silver alginate ooThe areas between both his first and second toes look worse. This in spite of long-standing antifungal therapy with ketoconazole and silver alginate which should have antifungal activity ooHe has small areas around his original wound on the left calf one is on the bottom of the original scar tissue and one superiorly both of these are small and superficial but again given wound history in this site this is worrisome 10/02/2018 ooLeft plantar heel continues to gradually contract using silver collagen ooLeft buttock wound is unchanged using silver alginate ooThe areas on his dorsal feet between his first and second toes bilaterally look about the same. 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 Small open area here. We have been using silver alginate 10/09/2018; ooLeft plantar heel continues to look healthy and contract. No debridement is required ooLeft buttock slightly smaller with a tape injury wound just below which was new this week ooDorsal feet somewhat improved I have been using clindamycin ooLeft lateral looks lower extremity the actual open area looks worse although a lot of this is epithelialized. I am going to  change to silver collagen today He has a lot more swelling in the right leg although this is not pitting not red and not particularly warm there is a lot of spasm in the right leg usually indicative of people with paralysis of some underlying discomfort. We have reviewed his vascular status from 2017 he had a left greater saphenous vein ablation. I wonder about referring him back to vascular surgery if the area on the left leg continues to deteriorate. 10/16/2018 in today for follow-up and management of multiple lower extremity ulcers. His left Buttock wound is much lower smaller and almost closed completely. The wound to the left ankle has began to reopen with Epithelialization and some adherent slough. He has multiple new areas to the left foot and leg. The left dorsal foot without much improvement. Wound present between left great webspace and 2nd  toe. Erythema and edema present right leg. Right LE ultrasound obtained on 10/10/18 was negative for DVT . 10/23/2018; ooLeft buttock is closed over. Still dry macerated skin but there is no open wound. I suspect this is chronic pressure/moisture ooLeft lateral calf is quite a bit worse than when I saw this last. There is clearly drainage here he has macerated skin into the left plantar heel. We will change the primary dressing to alginate ooLeft dorsal foot has some improvement in overall wound area. Still using clindamycin and silver alginate ooRight dorsal foot about the same as the left using clindamycin and silver alginate ooThe erythema in the right leg has resolved. He is DVT rule out was negative ooLeft heel pressure area required debridement although the wound is smaller and the surface is health 10/26/2018 ooThe patient came back in for his nurse check today predominantly because of the drainage coming out of the left lateral leg with a recent reopening of his original wound on the left lateral calf. He comes in today with a large  amount of surrounding erythema around the wound extending from the calf into the ankle and even in the area on the dorsal foot. He is not systemically unwell. He is not febrile. Nevertheless this looks like cellulitis. We have been using silver alginate to the area. I changed him to a regular visit and I am going to prescribe him doxycycline. The rationale here is a long list of medication intolerances and a history of MRSA. I did not see anything that I thought would provide a valuable culture 10/30/2018 ooFollow-up from his appointment 4 days ago with really an extensive area of cellulitis in the left calf left lateral ankle and left dorsal foot. I put him on doxycycline. He has a long list of medication allergies which are true allergy reactions. 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 In any case he is a lot better today. The erythema especially in the anterior and lateral left calf is better. He still has left ankle erythema. He also is complaining about increasing edema in the right leg we have only been using Kerlix Coban and he has been doing the wraps at home. Finally he has a spotty rash on the medial part of his upper left calf which looks like folliculitis or perhaps wrap occlusion type injury. Small superficial macules not pustules 11/06/18 patient arrives today with again a considerable degree of erythema around the wound on the left lateral calf extending into the dorsal ankle and dorsal foot. This is a lot worse than when I saw this last week. He is on doxycycline really with not a lot of improvement. He has not been systemically unwell Wounds on the; left heel actually looks improved. Original area on the left foot and proximity to the first and second toes looks about the same. He has superficial areas on the dorsal foot, anterior calf and then the reopening of his original wound on the left lateral calf which  looks about the same ooThe only area he has on the right is the dorsal webspace first and second which is smaller. ooHe has a large area of dry erythematous skin on the left buttock small open area here. 11/13/2018; the patient arrives in much better condition. The erythema around the wound on the left lateral calf is a lot better. Not sure whether this was the clindamycin or the TCA and ketoconazole or just in the  improvement in edema control [stasis dermatitis]. In any case this is a lot better. The area on the left heel is very small and just about resolved using silver collagen we have been using silver alginate to the areas on his dorsal feet 11/20/2018; his wounds include the left lateral calf, left heel, dorsal aspects of both feet just proximal to the first second webspace. He is stable to slightly improved. I did not think any changes to his dressings were going to be necessary 11/27/2018 he has a reopening on the left buttock which is surrounded by what looks like tinea or perhaps some other form of dermatitis. The area on the left dorsal foot has some erythema around it I have marked this area but I am not sure whether this is cellulitis or not. Left heel is not closed. Left calf the reopening is really slightly longer and probably worse 1/13; in general things look better and smaller except for the left dorsal foot. Area on the left heel is just about closed, left buttock looks better only a small wound remains in the skin looks better [using Lotrisone] 1/20; the area on the left heel only has a few remaining open areas here. Left lateral calf about the same in terms of size, left dorsal foot slightly larger right lateral foot still not closed. The area on the left buttock has no open wound and the surrounding skin looks a lot better 1/27; the area on the left heel is closed. Left lateral calf better but still requiring extensive debridements. The area on his left buttock is closed. He  still has the open areas on the left dorsal foot which is slightly smaller in the right foot which is slightly expanded. We have been using Iodoflex on these areas as well 2/3; left heel is closed. Left lateral calf still requiring debridement using Iodoflex there is no open area on his left buttock however he has dry scaly skin over a large area of this. Not really responding well to the Lotrisone. Finally the areas on his dorsal feet at the level of the first second webspace are slightly smaller on the right and about the same on the left. Both of these vigorously debrided with Anasept and gauze 2/10; left heel remains closed he has dry erythematous skin over the left buttock but there is no open wound here. Left lateral leg has come in and with. Still requiring debridement we have been using Iodoflex here. Finally the area on the left dorsal foot and right dorsal foot are really about the same extremely dry callused fissured areas. He does not yet have a dermatology appointment 2/17; left heel remains closed. He has a new open area on the left buttock. The area on the left lateral calf is bigger longer and still covered in necrotic debris. No major change in his foot areas bilaterally. I am awaiting for a dermatologist to look on this. We have been using ketoconazole I do not know that this is been doing any good at all. 2/24; left heel remains closed. The left buttock wound that was new reopening last week looks better. The left lateral calf appears better also although still requires debridement. The major area on his foot is the left first second also requiring debridement. We have been putting Prisma on all wounds. I do not believe that the ketoconazole has done too much good for his feet. He will use Lotrisone I am going to give him a 2-week course of terbinafine. We  still do not have a dermatology appointment 3/2 left heel remains closed however there is skin over bone in this area I pointed  this out to him today. The left buttock wound is epithelialized but still does not look completely stable. The area on the left leg required debridement were using silver collagen here. With regards to his feet we changed to Lotrisone last week and silver alginate. 3/9; left heel remains closed. Left buttock remains closed. The area on the right foot is essentially closed. The left foot remains unchanged. Slightly smaller on the left lateral calf. Using silver collagen to both of these areas 3/16-Left heel remains closed. Area on right foot is closed. Left lateral calf above the lateral malleolus open wound requiring debridement with easy bleeding. Left dorsal wound proximal to first toe also debrided. Left ischial area open new. Patient has been using Prisma with wrapping every 3 days. Dermatology appointment is apparently tomorrow.Patient has completed his terbinafine 2-week course with some apparent improvement according to him, there is still flaking and dry skin in his foot on the left 3/23; area on the right foot is reopened. The area on the left anterior foot is about the same still a very necrotic adherent surface. He still has the area on the left leg and reopening is on the left buttock. He apparently saw dermatology although I do not have a note. According to the patient who is usually fairly well informed they did not have any good ideas. Put him on oral terbinafine which she is been on before. 3/30; using silver collagen to all wounds. Apparently his dermatologist put him on doxycycline and rifampin presumably some culture grew staph. I do not have this result. He remains on terbinafine although I have used terbinafine on him before 4/6; patient has had a fairly substantial reopening on the right foot between the first and second toes. He is finished his terbinafine and I believe is on doxycycline and rifampin still as prescribed by dermatology. We have been using silver collagen to all  his wounds although the patient reports that he thinks silver alginate does better on the wounds on his buttock. 4/13; the area on his left lateral calf about the same size but it did not require debridement. ooLeft dorsal foot just proximal to the webspace between the first and second toes is about the same. Still nonviable surface. I note some superficial bronze discoloration of the dorsal part of his foot ooRight dorsal foot just proximal to the first and second toes also looks about the same. I still think there may be the same discoloration I noted above on the left ooLeft buttock wound looks about the same 4/20; left lateral calf appears to be gradually contracting using silver collagen. 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 The left dorsal foot wound is debrided of tightly adherent necrotic debris and really cleans up quite nicely. The right area is worse with expansion. I did not debride this it is now over the base of the second toe ooThe area on his left buttock is smaller no debridement is required using silver collagen 5/4; left calf continues to make good progress. ooHe arrives with erythema around the wounds on his dorsal foot which even extends to the plantar aspect. Very concerning for coexistent infection. He is finished the erythromycin I gave him for possible erythrasma this does not seem to have helped. ooThe area on the left foot is about the same base of  the dorsal toes ooIs area on the buttock looks improved on the left 5/11; left calf and left buttock continued to make good progress. Left foot is about the same to slightly improved. ooMajor problem is on the right foot. He has not had an x-ray. Deep tissue culture I did last week showed both Enterobacter and E. coli. I did not change the doxycycline I put him on empirically although neither 1 of these were plated to doxycycline. He arrives today with  the erythema looking worse on both the dorsal and plantar foot. Macerated skin on the bottom of the foot. he has not been systemically unwell 5/18-Patient returns at 1 week, left calf wound appears to be making some progress, left buttock wound appears slightly worse than last time, left foot wound looks slightly better, right foot redness is marginally better. X-ray of both feet show no air or evidence of osteomyelitis. Patient is finished his Omnicef and terbinafine. He continues to have macerated skin on the bottom of the left foot as well as right 5/26; left calf wound is better, left buttock wound appears to have multiple small superficial open areas with surrounding macerated skin. X-rays that I did last time showed no evidence of osteomyelitis in either foot. He is finished cefdinir and doxycycline. I do not think that he was on terbinafine. He continues to have a large superficial open area on the right foot anterior dorsal and slightly between the first and second toes. I did send him to dermatology 2 months ago or so wondering about whether they would do a fungal scraping. I do not believe they did but did do a culture. We have been using silver alginate to the toe areas, he has been using antifungals at home topically either ketoconazole or Lotrisone. We are using silver collagen on the left foot, silver alginate on the right, silver collagen on the left lateral leg and silver alginate on the left buttock 6/1; left buttock area is healed. We have the left dorsal foot, left lateral leg and right dorsal foot. We are using silver alginate to the areas on both feet and silver collagen to the area on his left lateral calf 6/8; the left buttock apparently reopened late last week. He is not really sure how this happened. He is tolerating the terbinafine. Using silver alginate to all wounds 6/15; left buttock wound is larger than last week but still superficial. ooCame in the clinic today with a  report of purulence from the left lateral leg I did not identify any infection ooBoth areas on his dorsal feet appear to be better. He is tolerating the terbinafine. Using silver alginate to all wounds 6/22; left buttock is about the same this week, left calf quite a bit better. His left foot is about the same however he comes in with erythema and warmth in the right forefoot once again. Culture that I gave him in the beginning of May showed Enterobacter and E. coli. I gave him doxycycline and things seem to improve although neither 1 of these organisms was specifically plated. 6/29; left buttock is larger and dry this week. Left lateral calf looks to me to be improved. Left dorsal foot also somewhat improved right foot completely unchanged. The erythema on the right foot is still present. He is completing the Ceftin dinner that I gave him empirically [see discussion above.) 7/6 - All wounds look to be stable and perhaps improved, the left buttock wound is slightly smaller, per patient bleeds easily, completed  ceftin, the right foot redness is less, he is on terbinafine 7/13; left buttock wound about the same perhaps slightly narrower. Area on the left lateral leg continues to narrow. Left dorsal foot slightly smaller right foot about the same. We are using silver alginate on the right foot and Hydrofera Blue to the areas on the left. Unna boot on the left 2 layer compression on the right 7/20; left buttock wound absolutely the same. Area on lateral leg continues to get better. Left dorsal foot require debridement as did the right no major change in the 7/27; left buttock wound the same size necrotic debris over the surface. The area on the lateral leg is closed once again. His left foot looks better right foot about the same although there is some involvement now of the posterior first second toe area. He is still on terbinafine which I have given him for a month, not certain a centimeter major  change 06/25/19-All wounds appear to be slightly improved according to report, left buttock wound looks clean, both foot wounds have minimal to no debris the right dorsal foot has minimal slough. We are using Hydrofera Blue to the left and silver alginate to the right foot and ischial wound. 8/10-Wounds all appear to be around the same, the right forefoot distal part has some redness which was not there before, however the wound looks clean and small. Ischial wound looks about the same with no changes 8/17; his wound on the left lateral calf which was his original chronic venous insufficiency wound remains closed. Since I last saw him the areas on the left dorsal foot right dorsal foot generally appear better but require debridement. The area on his left initial tuberosity appears somewhat larger to me perhaps hyper granulated and bleeds very easily. We have been using Hydrofera Blue to the left dorsal foot and silver alginate to everything else 8/24; left lateral calf remains closed. The areas on his dorsal feet on the webspace of the first and second toes bilaterally both look better. The area on the left buttock which is the pressure ulcer stage II slightly smaller. I change the dressing to Hydrofera Blue to all areas 8/31; left lateral calf remains closed. The area on his dorsal feet bilaterally look better. Using Hydrofera Blue. Still requiring debridement on the left foot. No change in the left buttock pressure ulcers however 9/14; left lateral calf remains closed. Dorsal feet look quite a bit better than 2 weeks ago. Flaking dry skin also a lot better with the ammonium lactate I gave him 2 weeks ago. The area on the left buttock is improved. He states that his Roho cushion developed a leak and he is getting a new one, in the interim he is offloading this vigorously 9/21; left calf remains closed. Left heel which was a possible DTI looks better this week. He had macerated tissue around the left  dorsal foot right foot looks satisfactory and improved left buttock wound. I changed his dressings to his feet to silver alginate bilaterally. Continuing Hydrofera Blue on the left buttock. 9/28 left calf remains closed. Left heel did not develop anything [possible DTI] dry flaking skin on the left dorsal foot. Right foot looks satisfactory. Improved left buttock wound. We are using silver alginate on his feet Hydrofera Blue on the buttock. I have asked him to go back to the Lotrisone on his feet including the wounds and surrounding areas 10/5; left calf remains closed. The areas on the left and right feet  about the same. A lot of this is epithelialized however debris over the remaining open areas. He is using Lotrisone and silver alginate. The area on the left buttock using Hydrofera Blue 10/26. Patient has been out for 3 weeks secondary to Covid concerns. He tested negative but I think his wife tested positive. He comes in today with the left foot substantially worse, right foot about the same. Even more concerning he states that the area on his left buttock closed over but then reopened and is considerably deeper in one aspect than it was before [stage III wound] 11/2; left foot really about the same as last week. Quarter sized wound on the dorsal foot just proximal to the first second toes. Surrounding erythema with areas of denuded epithelium. This is not really much different looking. Did not look like cellulitis this time however. ooRight foot area about the same.. We have been using silver alginate alginate on his toes ooLeft buttock still substantial irritated skin around the wound which I think looks somewhat better. We have been using Hydrofera Blue here. 11/9; left foot larger than last week and a very necrotic surface. Right foot I think is about the same perhaps slightly smaller. Debris around the circumference also addressed. Unfortunately on the left buttock there is been a decline.  Satellite lesions below the major wound distally and now a an additional one posteriorly we have been using Hydrofera Blue but I think this is a pressure issue 11/16; left foot ulcer dorsally again a very adherent necrotic surface. Right foot is about the same. Not much change in the pressure ulcer on his left buttock. 11/30; left foot ulcer dorsally basically the same as when I saw him 2 weeks ago. Very adherent fibrinous debris on the wound surface. Patient reports a lot of drainage as well. 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 We have been using Iodoflex, patient changed back to alginate because of the drainage. Area on his right dorsal foot really looks benign with a healthier surface certainly a lot better than on the left. Left buttock wounds all improved using Hydrofera Blue 12/7; left dorsal foot again no improvement. Tightly adherent debris. PCR culture I did last week only showed likely skin contaminant. I have gone ahead and done a punch biopsy of this which is about the last thing in terms of investigations I can think to do. He has known venous insufficiency and venous hypertension and this could be the issue here. The area on the right foot is about the same left buttock slightly worse according to our intake nurse secondary to [facility] Blue sticking to the wound 12/14; biopsy of the left foot that I did last time showed changes that could be related to wound healing/chronic stasis dermatitis phenomenon no neoplasm. We have been using silver alginate to both feet. I change the one on the left today to Sorbact and silver alginate to his other 2 wounds 12/28; the patient arrives with the following problems; ooMajor issue is the dorsal left foot which continues to be a larger deeper wound area. Still with a completely nonviable surface ooParadoxically the area mirror image on the right on the right dorsal foot appears to be getting  better. ooHe had some loss of dry denuded skin from the lower part of his original wound on the left lateral calf. Some of this area looked a little vulnerable and for this reason we put him in wrap that on this  side this week ooThe area on his left buttock is larger. He still has the erythematous circular area which I think is a combination of pressure, sweat. This does not look like cellulitis or fungal dermatitis 11/26/2019; -Dorsal left foot large open wound with depth. Still debris over the surface. Using Sorbact ooThe area on the dorsal right foot paradoxically has closed over [facility] has a reopening on the left ankle laterally at the base of his original wound that extended up into the calf. This appears clean. ooThe left buttock wound is smaller but with very adherent necrotic debris over the surface. We have been using silver alginate here as well The patient had arterial studies done in 2017. He had biphasic waveforms at the dorsalis pedis and posterior tibial bilaterally. ABI in the left was 1.17. Digit waveforms were dampened. He has slight spasticity in the great toes I do not think a TBI would be possible 1/11; the patient comes in today with a sizable reopening between the first and second toes on the right. This is not exactly in the same location where we have been treating wounds previously. According to our intake nurse this was actually fairly deep but 0.6 cm. The area on the left dorsal foot looks about the same the surface is somewhat cleaner using Sorbact, his MRI is in 2 days. We have not managed yet to get arterial studies. The new reopening on the left lateral calf looks somewhat better using alginate. The left buttock wound is about the same using alginate 1/18; the patient had his ARTERIAL studies which were quite normal. ABI in the right at 1.13 with triphasic/biphasic waveforms on the left ABI 1.06 again with triphasic/biphasic waveforms. It would not have been possible  to have done a toe brachial index because of spasticity. We have been using Sorbac to the left foot alginate to the rest of his wounds on the right foot left lateral calf and left buttock 1/25; arrives in clinic with erythema and swelling of the left forefoot worse over the first MTP area. This extends laterally dorsally and but also posteriorly. Still has an area on the left lateral part of the lower part of his calf wound it is eschared and clearly not closed. ooArea on the left buttock still with surrounding irritation and erythema. ooRight foot surface wound dorsally. The area between the right and first and second toes appears better. 2/1; ooThe left foot wound is about the same. Erythema slightly better I gave him a week of doxycycline empirically ooRight foot wound is more extensive extending between the toes to the plantar surface ooLeft lateral calf really no open surface on the inferior part of his original wound however the entire area still looks vulnerable ooAbsolutely no improvement in the left buttock wound required debridement. 2/8; the left foot is about the same. Erythema is slightly improved I gave him clindamycin last week. ooRight foot looks better he is using Lotrimin and silver alginate ooHe has a breakdown in the left lateral calf. Denuded epithelium which I have removed ooLeft buttock about the same were using Hydrofera Blue 2/15; left foot is about the same there is less surrounding erythema. Surface still has tightly adherent debris which I have debriding however not making any progress ooRight foot has a substantial wound on the medial right second toe between the first and second webspace. ooStill an open area on the left lateral calf distal area. ooButtock wound is about the same 2/22; left foot is about the same  less surrounding erythema. Surface has adherent debris. Polymen Ag Right foot area significant wound between the first and second toes. We have  been using silver alginate here Left lateral leg polymen Ag at the base of his original venous insufficiency wound ooLeft buttock some improvement here 3/1; ooRight foot is deteriorating in the first second toe webspace. Larger and more substantial. We have been using silver alginate. ooLeft dorsal foot about the same markedly adherent surface debris using PolyMem Ag ooLeft lateral calf surface debris using PolyMem AG ooLeft buttock is improved again using PolyMem Ag. ooHe is completing his terbinafine. The erythema in the foot seems better. He has been on this for 2 weeks 3/8; no improvement in any wound area in fact he has a small open area on the dorsal midfoot which is new this week. He has not gotten his foot x-rays yet 3/15; his x-rays were both negative for osteomyelitis of both feet. No major change in any of his wounds on the extremities however his buttock wounds are better. We have been using polymen on the buttocks, left lower leg. Iodoflex on the left foot and silver alginate on the right 3/22; arrives in clinic today with the 2 major issues are the improvement in the left dorsal foot wound which for once actually looks healthy with a nice healthy wound surface without debridement. Using Iodoflex here. Unfortunately on the left lateral calf which is in the distal part of his original wound he came to the clinic here for there was purulent drainage noted some increased breakdown scattered around the original area and a small area proximally. We we are using polymen here will change to silver alginate today. His buttock wound on the left is better and I think the area on the right first second toe webspace is also improved 3/29; left dorsal foot looks better. Using Iodoflex. Left ankle culture from deterioration last time grew E. coli, Enterobacter and Enterococcus. I will give him a course of cefdinir although that will not cover Enterococcus. The area on the right foot in the  webspace of the first and second toe lateral first toe looks better. The area on his buttock is about healed Vascular appointment is on April 21. This is to look at his venous system vis--vis continued breakdown of the wounds on the left including the left lateral leg and left dorsal foot he. He has had previous ablations on this side 4/5; the area between the right first and second toes lateral aspect of the first toe looks better. Dorsal aspect of the left first toe on the left foot also improved. Unfortunately the left lateral lower leg is larger and there is a second satellite wound superiorly. The usual superficial abrasions on the left buttock overall better but certainly not closed 4/12; the area between the right first and second toes is improved. Dorsal aspect of the left foot also slightly smaller with a vibrant healthy looking surface. No real change in the left lateral leg and the left buttock wound is healed He has an unaffordable co-pay for Apligraf. Appointment with vein and vascular with regards to the left leg venous part of the circulation is on 4/21 4/19; we continue to see improvement in all wound areas. Although this is minor. He has his vascular appointment on 4/21. The area on the left buttock has not reopened although right in the center of this area the skin looks somewhat threatened 4/26; the left buttock is unfortunately reopened. In general his left dorsal  foot has a healthy surface and looks somewhat smaller although it was not measured as such. The area between his first and second toe webspace on the right as a small wound against the first toe. The patient saw vascular surgery. The real question I was asking was about the small saphenous vein on the left. He has previously ablated left greater saphenous vein. Nothing further was commented on on the left. Right greater saphenous vein without reflux at the saphenofemoral junction or proximal thigh there was no  indication for ablation of the right greater saphenous vein duplex was negative for DVT bilaterally. They did not think there was anything from a vascular surgery point of view that could be offered. They ABIs within normal limits 5/3; only small open area on the left buttock. The area on the left lateral leg which was his original venous reflux is now 2 wounds both which look clean. We are using Iodoflex on the left dorsal foot which looks healthy and smaller. He is down to a very tiny area between the right first and second toes, using silver alginate 5/10; all of his wounds appear better. We have much better edema control in 4 layer compression on the left. This may be the factor that is allowing the left foot and left lateral calf to heal. He has external compression garments at home 04/14/20-All of his wounds are progressing well, the left forefoot is practically closed, left ischium appears to be about the same, right toe webspace is also smaller. The left lateral leg is about the same, continue using Hydrofera Blue to this, silver alginate to the ischium, Iodoflex to the toe space on the right 6/7; most of his wounds outside of the left buttock are doing well. The area on the left lateral calf and left dorsal foot are smaller. The area on the right foot in between the first and second toe webspace is barely visible although he still says there is some drainage here is the only reason I did not heal this out. ooUnfortunately the area on the left buttock almost looks like he has a skin tear from tape. He has open wound and then a large flap of skin that we are trying to get adherence over an area just next to the remaining wound 6/21; 2 week follow-up. I believe is been here for nurse visits. Miraculously the area between his first and second toes on the left dorsal foot is closed over. Still open on the right first second web space. The left lateral calf has 2 open areas. Distally this is more  superficial. The proximal area had a little more depth and required debridement of adherent necrotic material. His buttock wound is actually larger we have been using silver alginate here 6/28; the patient's area on the left foot remains closed. Still open wet area between the first and second toes on the right and also extending into the plantar aspect. We have been using silver alginate in this location. He has 2 areas on the left lower leg part of his original long wounds which I think are better. We have been using Hydrofera Blue here. Hydrofera Blue to the left buttock which is stable 7/12; left foot remains closed. Left ankle is closed. May be a small area between his right first and second toes the only truly open area is on the left buttock. We have been using Hydrofera Blue here 7/19; patient arrives with marked deterioration especially in the left foot and ankle. We  did not put him in a compression wrap on the left last week in fact he wore his juxta lite stockings on either side although he does not have an underlying stocking. He has a reopening on the left dorsal foot, left lateral ankle and a new area on the right dorsal ankle. More worrisome is the degree of erythema on the left foot extending on the lateral foot into the lateral lower leg on the left 7/26; the patient had erythema and drainage from the lateral left ankle last week. Culture of this grew MRSA resistant to doxycycline and clindamycin which are the 2 antibiotics we usually use with this patient who has multiple antibiotic allergies including linezolid, trimethoprim sulfamethoxazole. I had give him an empiric doxycycline and he comes in the area certainly looks somewhat better although it is blotchy in his lower leg. He has not been systemically unwell. He has had areas on the left dorsal foot which is a reopening, chronic wounds on the left lateral ankle. Both of these I think are secondary to chronic venous insufficiency.  The area between his first and second toes is closed as far as I can tell. He had a new wrap injury on the right dorsal ankle last week. Finally he has an area on the left buttock. We have been using silver alginate to everything except the left buttock we are using Hydrofera Blue 06/30/20-Patient returns at 1 week, has been given a sample dose pack of NUZYRA which is a tetracycline derivative [omadacycline], patient has completed those, we have been using silver alginate to almost all the wounds except the left ischium where we are using Hydrofera Blue all of them look better 8/16; since I last saw the patient he has been doing well. The area on the left buttock, left lateral ankle and left foot are all closed today. He has completed the [place] I gave him last time and tolerated this well. He still has open areas on the right dorsal ankle and in the right first second toe area which we are using silver alginate. 8/23; we put him in his bilateral external compression stockings last week as he did not have anything open on either leg except for concerning area between the right first and second toe. He comes in today with an area on the left dorsal foot slightly more proximal than the original wound, the left lateral foot but this is actually a continuation of the area he had on the left lateral ankle from last time. As well he is opened up on the left buttock again. 8/30; comes in today with things looking a lot better. The area on the left lower ankle has closed down as has the left foot but with eschar in both areas. The area on the dorsal right ankle is also epithelialized. Very little remaining of the left buttock wound. We have been using silver alginate on all wound areas 9/13; the area in the first second toe webspace on the right has fully epithelialized. He still has some vulnerable epithelium on the right and the ankle and the dorsal foot. He notes weeping. He is using his juxta lite  stocking. On the left again the left dorsal foot is closed left lateral ankle is closed. We went to the juxta lite stocking here as well. ooStill vulnerable in the left buttock although only 2 small open areas remain here 9/27; 2-week follow-up. We did not look at his left leg but the patient says everything is closed. He  is a bit disturbed by the amount of edema in his left foot he is using juxta lite stockings but asking about over the toes stockings which would be 30/40, will talk to him next time. According to him there is no open wound on either the left foot or the left ankle/calf He has an open area on the dorsal right calf which I initially point a wrap injury. He has superficial remaining wound on the left ischial tuberosity been using silver alginate although he says this sticks to the wound 10/5; we gave him 2-week follow-up but he called yesterday expressing some concerns about his right foot right ankle and the left buttock. He came in early. There is still no open areas on the left leg and that still in his juxta lite stocking 10/11; he only has 1 small area on the left buttock that remains measuring millimeters 1 mm. Still has the same irritated skin in this area. We recommended zinc oxide when this eventually closes and pressure relief is meticulously is he can do this. He still has an area on the dorsal part of his right first through third toes which is a bit irritated and still open and on the dorsal ankle near the crease of the ankle. We have been using silver alginate and using his own stocking. He has nothing open on the left leg or foot 10/25; 2-week follow-up. Not nearly as good on the left buttock as I was hoping. For open areas with 5 looking threatened small. He has the erythematous irritated chronic skin in this area. oo1 area on the right dorsal ankle. He reports this area bleeds easily ooRight dorsal foot just proximal to the base of his toes ooWe have been using  silver alginate. 11/8; 2-week follow-up. Left buttock is about the same although I do not think the wounds are in the same location we have been using silver alginate. I have asked him to use zinc oxide on the skin around the wounds. ooHe still has a small area on the right dorsal ankle he reports this bleeds easily ooRight dorsal foot just proximal to the base of the toes does not have anything open although the skin is very dry and scaly ooHe has a new opening on the nailbed of the left great toe. Nothing on the left ankle 11/29; 3-week follow-up. Left buttock has 2 open areas. And washing of these wounds today started bleeding easily. Suggesting very friable tissue. We have been using silver alginate. Right dorsal ankle which I thought was initially a wrap injury we have been using silver alginate. Nothing open between the toes that I can see. He states the area on the left dorsal toe nailbed healed after the last visit in 2 or 3 days 12/13; 3-week follow-up. His left buttock now has 3 open areas but the original 2 areas are smaller using polymen here. Surrounding skin looks better. The right dorsal ankle is closed. He has a small opening on the right dorsal foot at the level of the third toe. In general the skin looks better here. He is wearing his juxta lite stocking on the left leg says there is nothing open 11/24/2020; 3 weeks follow-up. His left buttock still has the 3 open areas. We have been using polymen but due to lack of response he changed to [facility] area. Surrounding skin is dry erythematous and irritated looking. There is no evidence of infection either bacterial or fungal however there is loss of surface epithelium   ooHe still has very dry skin in his foot causing irritation and erythema on the dorsal part of his toes. This is not responded to prolonged courses of antifungal simply looks dry and irritated 1/24; left buttock area still looks about the same he was unable to  find the triad ointment that we had suggested. The area on the right lower leg just above the dorsal ankle has reopened and the areas on the right foot between the first second and second third toes and scaling on the bottom of the foot has been about the same for quite some time now. been using silver alginate to all wound areas Objective Constitutional Sitting or standing Blood Pressure is within target range for patient.. Pulse regular and within target range for patient.Marland Kitchen Respirations regular, non-labored and within target range.. Temperature is normal and within the target range for the patient.Marland Kitchen Appears in no distress. Vitals Time Taken: 8:04 AM, Height: 70 in, Weight: 216 lbs, BMI: 31, Temperature: 98.2 F, Pulse: 107 bpm, Respiratory Rate: 17 breaths/min, Blood Pressure: 137/74 mmHg. General Notes: Wound exam ooLeft buttock probably not a lot better. The wound areas look healthy in terms of surface. Still the dry erythematous scaly skin around this Integumentary (Hair, Skin) Wound #38R status is Open. Original cause of wound was Gradually Appeared. The wound is located on the Right T - Web between 1st and 2nd. The wound oe measures 0.4cm length x 0.2cm width x 0.2cm depth; 0.063cm^2 area and 0.013cm^3 volume. There is Fat Layer (Subcutaneous Tissue) exposed. There is no tunneling or undermining noted. There is a small amount of serous drainage noted. The wound margin is flat and intact. There is medium (34-66%) pink granulation within the wound bed. There is a medium (34-66%) amount of necrotic tissue within the wound bed including Adherent Slough. Wound #41R status is Open. Original cause of wound was Gradually Appeared. The wound is located on the Left Ischium. The wound measures 4.1cm length x 3.5cm width x 0.1cm depth; 11.27cm^2 area and 1.127cm^3 volume. There is Fat Layer (Subcutaneous Tissue) exposed. There is no tunneling or undermining noted. There is a medium amount of  serosanguineous drainage noted. The wound margin is distinct with the outline attached to the wound base. There is large (67-100%) red, friable granulation within the wound bed. There is no necrotic tissue within the wound bed. Wound #47 status is Open. Original cause of wound was Gradually Appeared. The wound is located on the Right,Dorsal Foot. The wound measures 0.7cm length x 2.8cm width x 0.1cm depth; 1.539cm^2 area and 0.154cm^3 volume. There is no tunneling or undermining noted. There is a medium amount of serosanguineous drainage noted. The wound margin is distinct with the outline attached to the wound base. There is medium (34-66%) red, pink granulation within the wound bed. There is a medium (34-66%) amount of necrotic tissue within the wound bed including Adherent Slough. Wound #48 status is Open. Original cause of wound was Gradually Appeared. The wound is located on the Right,Anterior Lower Leg. The wound measures 0.7cm length x 0.5cm width x 0.2cm depth; 0.275cm^2 area and 0.055cm^3 volume. There is no tunneling or undermining noted. There is a medium amount of serosanguineous drainage noted. The wound margin is distinct with the outline attached to the wound base. There is large (67-100%) red, pink granulation within the wound bed. There is no necrotic tissue within the wound bed. 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 Bathing/ Shower/ Hygiene: May shower and wash wound with soap and water. - on days that dressing is changed Edema Control - Lymphedema / SCD / Other: Elevate legs to the level of the heart or above for 30  minutes daily and/or when sitting, a frequency of: - throughout the day Compression stocking or Garment 30-40 mm/Hg pressure to: - Juxtalite to both legs daily Off-Loading: Roho cushion for wheelchair Turn and reposition every 2 hours The following medication(s) was prescribed: ammonium lactate topical 12 % cream cream topical to affected area bid starting 12/15/2020 WOUND #38R: - T - Web between 1st and 2nd Wound Laterality: Right oe Cleanser: Soap and Water Every Other Day/30 Days Discharge Instructions: May shower and wash wound with dial antibacterial soap and water prior to dressing change. Peri-Wound Care: Sween Lotion (Moisturizing lotion) Every Other Day/30 Days Discharge Instructions: Apply moisturizing lotion as directed Prim Dressing: KerraCel Ag Gelling Fiber Dressing, 2x2 in (silver alginate) Every Other Day/30 Days ary Discharge Instructions: Apply silver alginate to wound bed as instructed Secondary Dressing: Woven Gauze Sponge, Non-Sterile 4x4 in Every Other Day/30 Days Discharge Instructions: Apply over primary dressing as directed. Secured With: [employer], 4.5x3.1 (in/yd) Every Other Day/30 Days Discharge Instructions: Secure with Kerlix as directed. WOUND #41R: - Ischium Wound Laterality: Left Cleanser: Soap and Water Every Other Day/30 Days Discharge Instructions: May shower and wash wound with dial antibacterial soap and water prior to dressing change. Peri-Wound Care: Triad Hydrophilic Wound Dressing Tube, 6 (oz) (Generic) Every Other Day/30 Days Discharge Instructions: Apply to periwound with each dressing change Prim Dressing: KerraCel Ag Gelling Fiber Dressing, 4x5 in (silver alginate) Every Other Day/30 Days ary Discharge Instructions: Apply silver alginate to wound bed as instructed Secondary Dressing: ComfortFoam Border, 4x4 in (silicone border) Every Other Day/30 Days Discharge Instructions: Apply over primary dressing as directed. WOUND #47: - Foot  Wound Laterality: Dorsal, Right Cleanser: Soap and Water Every Other Day/30 Days Discharge Instructions: May shower and wash wound with dial antibacterial soap and water prior to dressing change. Topical: ammonium lactate Every Other Day/30 Days Discharge Instructions: apply between the toes, plantar, and dorsal foot with dressing changes. Pick up at pharmacy. Prim Dressing: KerraCel Ag Gelling Fiber Dressing, 2x2 in (silver alginate) Every Other Day/30 Days ary Discharge Instructions: Apply silver alginate to wound bed as instructed Secondary Dressing: Woven Gauze Sponge, Non-Sterile 4x4 in Every Other Day/30 Days Discharge Instructions: Apply over primary dressing as directed. Secured With: [employer], 4.5x3.1 (in/yd) (Generic) Every Other Day/30 Days Discharge Instructions: Secure with Kerlix as directed. #1 I have not changed what we suggested about the left buttock which is still silver alginate. Patient reports some increased drainage and odor although nothing looked any different here. Certainly no obvious infection 2. Silver alginate on the lower anterior right leg just above the crease of the ankle. 3. On the right foot I am going to prescribe ammonium lactate to see if we can do something to remove the thick dry scaling surface here. At one point I thought this was tinea pedis and treat him accordingly that did not work. I then moved to a steroid that did not work either. Combination of steroids and antifungal did not work even oral antifungals at 1 point really  did not have much effect on this.` Electronic Signature(s) Signed: 12/15/2020 8:45:05 AM By: Linton Ham MD Entered By: Linton Ham on 12/15/2020 08:45:04 -------------------------------------------------------------------------------- SuperBill Details Patient Name: Date of Service: Sianez, [name] 12/15/2020 Medical Record Number: [id number] Patient Account Number: [id number] Date of Birth/Sex: Treating  RN: 1986/09/17 (35 y.o. [name] Primary Care Provider: [place], [place] Other Clinician: Referring Provider: Treating Provider/Extender: [name]: 258 Diagnosis Coding ICD-10 Codes Code Description I87.332 Chronic venous hypertension (idiopathic) with ulcer and inflammation of left lower extremity L97.311 Non-pressure chronic ulcer of right ankle limited to breakdown of skin L97.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, 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, complete Facility Procedures CPT4 Code: [id number] Description: [id number] - WOUND CARE VISIT-LEV 5 EST PT Modifier: Quantity: 1 Physician Procedures : CPT4 Code Description Modifier [id number] - WC PHYS LEVEL 3 - EST PT ICD-10 Diagnosis Description L97.311 Non-pressure chronic ulcer of right ankle limited to breakdown of skin L97.311 Non-pressure chronic ulcer of right ankle limited to breakdown  of skin I87.332 Chronic venous hypertension (idiopathic) with ulcer and inflammation of left lower extremity L97.511 Non-pressure chronic ulcer of other part of right foot limited to breakdown of skin Quantity: 1 Electronic Signature(s) Signed: 12/15/2020 5:37:42 PM By: Linton Ham MD Entered By: Linton Ham on 12/15/2020 08:45:26

## 2020-12-18 NOTE — Progress Notes (Signed)
Kersten, EURAL HOVORKA ([id number]) Visit Report for 12/15/2020 Arrival Information Details Patient Name: Date of Service: Mcgahee, [name] 12/15/2020 8:00 A M Medical Record Number: [id number] Patient Account Number: [id number] Date of Birth/Sex: Treating RN: 06/27/1986 (35 y.o. [name] Primary Care Marley Charlot: [name], [name] Other Clinician: Referring Archita Lomeli: Treating Delynn Olvera/Extender: [name]: 81 Visit Information History Since Last Visit Added or deleted any medications: No Patient Arrived: Wheel Chair Any new allergies or adverse reactions: No Arrival Time: 08:03 Had a fall or experienced change in No Accompanied By: self activities of daily living that may affect Transfer Assistance: None risk of falls: Patient Identification Verified: Yes Signs or symptoms of abuse/neglect since last visito No Secondary Verification Process Completed: Yes Hospitalized since last visit: No Patient Requires Transmission-Based Precautions: No Implantable device outside of the clinic excluding No Patient Has Alerts: Yes cellular tissue based products placed in the center Patient Alerts: R ABI = 1.0 since last visit: L ABI = 1.1 Has Dressing in Place as Prescribed: Yes Pain Present Now: No Electronic Signature(s) Signed: 12/17/2020 5:56:51 PM By: Rhae Hammock RN Entered By: Rhae Hammock on 12/15/2020 08:03:50 -------------------------------------------------------------------------------- Clinic Level of Care Assessment Details Patient Name: Date of Service: Miyasato, [name] 12/15/2020 8:00 A M Medical Record Number: [id number] Patient Account Number: [id number] Date of Birth/Sex: Treating RN: 12-21-1985 (35 y.o. [name] Primary Care Yvetta Drotar: [name], [name] Other Clinician: Referring Tekelia Kareem: Treating Bulmaro Feagans/Extender: [name]: 70 Clinic Level of Care Assessment Items TOOL 4 Quantity  Score X- 1 0 Use when only an EandM is performed on FOLLOW-UP visit ASSESSMENTS - Nursing Assessment / Reassessment X- 1 10 Reassessment of Co-morbidities (includes updates in patient status) X- 1 5 Reassessment of Adherence to Treatment Plan ASSESSMENTS - Wound and Skin A ssessment / Reassessment []  - 0 Simple Wound Assessment / Reassessment - one wound X- 4 5 Complex Wound Assessment / Reassessment - multiple wounds X- 1 10 Dermatologic / Skin Assessment (not related to wound area) ASSESSMENTS - Focused Assessment X- 2 5 Circumferential Edema Measurements - multi extremities X- 1 10 Nutritional Assessment / Counseling / Intervention []  - 0 Lower Extremity Assessment (monofilament, tuning fork, pulses) []  - 0 Peripheral Arterial Disease Assessment (using hand held doppler) ASSESSMENTS - Ostomy and/or Continence Assessment and Care []  - 0 Incontinence Assessment and Management []  - 0 Ostomy Care Assessment and Management (repouching, etc.) PROCESS - Coordination of Care []  - 0 Simple Patient / Family Education for ongoing care X- 1 20 Complex (extensive) Patient / Family Education for ongoing care X- 1 10 Staff obtains Programmer, systems, Records, T Results / Process Orders est []  - 0 Staff telephones HHA, Nursing Homes / Clarify orders / etc []  - 0 Routine Transfer to another Facility (non-emergent condition) []  - 0 Routine Hospital Admission (non-emergent condition) X- 1 15 New Admissions / Biomedical engineer / Ordering NPWT Apligraf, etc. , []  - 0 Emergency Hospital Admission (emergent condition) []  - 0 Simple Discharge Coordination X- 1 15 Complex (extensive) Discharge Coordination PROCESS - Special Needs []  - 0 Pediatric / Minor Patient Management []  - 0 Isolation Patient Management []  - 0 Hearing / Language / Visual special needs []  - 0 Assessment of Community assistance (transportation, D/C planning, etc.) []  - 0 Additional assistance / Altered  mentation []  - 0 Support Surface(s) Assessment (bed, cushion, seat, etc.) INTERVENTIONS - Wound Cleansing / Measurement []  - 0 Simple Wound Cleansing - one wound  X- 4 5 Complex Wound Cleansing - multiple wounds X- 1 5 Wound Imaging (photographs - any number of wounds) []  - 0 Wound Tracing (instead of photographs) []  - 0 Simple Wound Measurement - one wound X- 4 5 Complex Wound Measurement - multiple wounds INTERVENTIONS - Wound Dressings X - Small Wound Dressing one or multiple wounds 1 10 X- 1 15 Medium Wound Dressing one or multiple wounds []  - 0 Large Wound Dressing one or multiple wounds []  - 0 Application of Medications - topical []  - 0 Application of Medications - injection INTERVENTIONS - Miscellaneous []  - 0 External ear exam []  - 0 Specimen Collection (cultures, biopsies, blood, body fluids, etc.) []  - 0 Specimen(s) / Culture(s) sent or taken to Lab for analysis []  - 0 Patient Transfer (multiple staff / Civil Service fast streamer / Similar devices) []  - 0 Simple Staple / Suture removal (25 or less) []  - 0 Complex Staple / Suture removal (26 or more) []  - 0 Hypo / Hyperglycemic Management (close monitor of Blood Glucose) []  - 0 Ankle / Brachial Index (ABI) - do not check if billed separately X- 1 5 Vital Signs Has the patient been seen at the hospital within the last three years: Yes Total Score: 200 Level Of Care: New/Established - Level 5 Electronic Signature(s) Signed: 12/15/2020 6:39:14 PM By: Deon Pilling Entered By: Deon Pilling on 12/15/2020 08:41:16 -------------------------------------------------------------------------------- Encounter Discharge Information Details Patient Name: Date of Service: Greenstein, [name] 12/15/2020 8:00 A M Medical Record Number: [id number] Patient Account Number: [id number] Date of Birth/Sex: Treating RN: 1986-04-16 (35 y.o. [name] Primary Care Sameeha Rockefeller: [place], [place] Other Clinician: Referring Korbin Notaro: Treating  Ibn Stief/Extender: [name]: 258 Encounter Discharge Information Items Discharge Condition: Stable Ambulatory Status: Wheelchair Discharge Destination: Home Transportation: Private Auto Accompanied By: self Schedule Follow-up Appointment: Yes Clinical Summary of Care: Patient Declined Electronic Signature(s) Signed: 12/17/2020 5:56:51 PM By: Rhae Hammock RN Entered By: Rhae Hammock on 12/15/2020 08:48:25 -------------------------------------------------------------------------------- Lower Extremity Assessment Details Patient Name: Date of Service: Medel, [name] 12/15/2020 8:00 A M Medical Record Number: [id number] Patient Account Number: [id number] Date of Birth/Sex: Treating RN: 1986-06-05 (35 y.o. [name] Primary Care Malya Cirillo: [place], [place] Other Clinician: Referring Cybill Uriegas: Treating Jared Whorley/Extender: [name]: 258 Edema Assessment Assessed: [Left: No] [Right: No] Edema: [Left: Ye] [Right: s] Calf Left: Right: Point of Measurement: 33 cm From Medial Instep 33 cm Ankle Left: Right: Point of Measurement: 10 cm From Medial Instep 24 cm Vascular Assessment Pulses: Dorsalis Pedis Palpable: [Right:Yes] Posterior Tibial Palpable: [Right:Yes] Electronic Signature(s) Signed: 12/17/2020 5:56:51 PM By: Rhae Hammock RN Entered By: Rhae Hammock on 12/15/2020 08:07:47 -------------------------------------------------------------------------------- Multi Wound Chart Details Patient Name: Date of Service: Alig, [name] 12/15/2020 8:00 A M Medical Record Number: [id number] Patient Account Number: [id number] Date of Birth/Sex: Treating RN: 08-11-1986 (35 y.o. [name] Primary Care Afton Lavalle: [name], [name] Other Clinician: Referring Waylon Koffler: Treating Consepcion Utt/Extender: [name]: 258 Vital Signs Height(in):  70 Pulse(bpm): 107 Weight(lbs): 216 Blood Pressure(mmHg): 137/74 Body Mass Index(BMI): 31 Temperature(F): 98.2 Respiratory Rate(breaths/min): 17 Photos: [38R:No Photos Right T - Web between 1st and 2nd Left Ischium oe] [41R:No Photos] [47:No Photos Right, Dorsal Foot] Wound Location: [38R:Gradually Appeared] [41R:Gradually Appeared] [47:Gradually Appeared] Wounding Event: [38R:Inflammatory] [41R:Pressure Ulcer] [47:Pressure Ulcer] Primary Etiology: [38R:Sleep Apnea, Hypertension, Paraplegia Sleep Apnea, Hypertension, Paraplegia Sleep Apnea, Hypertension, Paraplegia] Comorbid History: [38R:11/30/2019] [41R:03/16/2020] [47:11/02/2020] Date Acquired: [38R:54] [41R:39] [47:6] Weeks  of Treatment: [38R:Open] [41R:Open] [47:Open] Wound Status: [38R:Yes] [41R:Yes] [47:No] Wound Recurrence: [38R:No] [41R:Yes] [47:No] Clustered Wound: [38R:N/A] [41R:3] [47:N/A] Clustered Quantity: [38R:0.4x0.2x0.2] [41R:4.1x3.5x0.1] [47:0.7x2.8x0.1] Measurements L x W x D (cm) [38R:0.063] [41R:11.27] [47:1.539] A (cm) : rea [38R:0.013] [41R:1.127] [47:0.154] Volume (cm) : [38R:80.90%] [41R:-524.00%] [47:49.10%] % Reduction in A rea: [38R:94.40%] [41R:-522.70%] [47:49.00%] % Reduction in Volume: [38R:Full Thickness Without Exposed] [41R:Category/Stage II] [47:Category/Stage II] Classification: [38R:Support Structures Small] [41R:Medium] [47:Medium] Exudate Amount: [38R:Serous] [41R:Serosanguineous] [47:Serosanguineous] Exudate Type: [38R:amber] [41R:red, brown] [47:red, brown] Exudate Color: [38R:Flat and Intact] [41R:Distinct, outline attached] [47:Distinct, outline attached] Wound Margin: [38R:Medium (34-66%)] [41R:Large (67-100%)] [47:Medium (34-66%)] Granulation Amount: [38R:Pink] [41R:Red, Friable] [47:Red, Pink] Granulation Quality: [38R:Medium (34-66%)] [41R:None Present (0%)] [47:Medium (34-66%)] Necrotic Amount: [38R:Fat Layer (Subcutaneous Tissue): Yes Fat Layer (Subcutaneous Tissue): Yes Fascia:  No] Exposed Structures: [38R:Fascia: No Tendon: No Muscle: No Joint: No Bone: No Large (67-100%)] [41R:Fascia: No Tendon: No Muscle: No Joint: No Bone: No Medium (34-66%)] [47:Fat Layer (Subcutaneous Tissue): No Tendon: No Muscle: No Joint: No Bone: No Small (1-33%)] Wound Number: 48 N/A N/A Photos: No Photos N/A N/A Right, Anterior Lower Leg N/A N/A Wound Location: Gradually Appeared N/A N/A Wounding Event: Pressure Ulcer N/A N/A Primary Etiology: Sleep Apnea, Hypertension, Paraplegia N/A N/A Comorbid History: 12/08/2020 N/A N/A Date Acquired: 0 N/A N/A Weeks of Treatment: Open N/A N/A Wound Status: No N/A N/A Wound Recurrence: No N/A N/A Clustered Wound: N/A N/A N/A Clustered Quantity: 0.7x0.5x0.2 N/A N/A Measurements L x W x D (cm) 0.275 N/A N/A A (cm) : rea 0.055 N/A N/A Volume (cm) : N/A N/A N/A % Reduction in A rea: N/A N/A N/A % Reduction in Volume: Category/Stage II N/A N/A Classification: Medium N/A N/A Exudate A mount: Serosanguineous N/A N/A Exudate Type: red, brown N/A N/A Exudate Color: Distinct, outline attached N/A N/A Wound Margin: Large (67-100%) N/A N/A Granulation A mount: Red, Pink N/A N/A Granulation Quality: None Present (0%) N/A N/A Necrotic A mount: Fascia: No N/A N/A Exposed Structures: Fat Layer (Subcutaneous Tissue): No Tendon: No Muscle: No Joint: No Bone: No Small (1-33%) N/A N/A Epithelialization: Treatment Notes Electronic Signature(s) Signed: 12/15/2020 5:37:42 PM By: Linton Ham MD Signed: 12/18/2020 5:48:06 PM By: Levan Hurst RN, BSN Entered By: Linton Ham on 12/15/2020 08:36:40 -------------------------------------------------------------------------------- Multi-Disciplinary Care Plan Details Patient Name: Date of Service: Woehrle, [name] 12/15/2020 8:00 A M Medical Record Number: [id number] Patient Account Number: [id number] Date of Birth/Sex: Treating RN: 27-Dec-1985 (35 y.o. [name] Primary Care Collie Kittel: [name], [name] Other Clinician: Referring Frankie Scipio: Treating Krystyna Cleckley/Extender: [name]: 258 Active Inactive Wound/Skin Impairment Nursing Diagnoses: Impaired tissue integrity Knowledge deficit related to ulceration/compromised skin integrity Goals: Patient/caregiver will verbalize understanding of skin care regimen Date Initiated: 01/05/2016 Target Resolution Date: 12/26/2020 Goal Status: Active Ulcer/skin breakdown will have a volume reduction of 30% by week 4 Date Initiated: 01/05/2016 Date Inactivated: 12/22/2017 Target Resolution Date: 01/19/2018 Unmet Reason: complex wounds, Goal Status: Unmet infection Interventions: Assess patient/caregiver ability to obtain necessary supplies Assess ulceration(s) every visit Provide education on ulcer and skin care Notes: Electronic Signature(s) Signed: 12/15/2020 6:39:14 PM By: Deon Pilling Signed: 12/15/2020 6:39:14 PM By: Deon Pilling Entered By: Deon Pilling on 12/15/2020 08:20:49 -------------------------------------------------------------------------------- Pain Assessment Details Patient Name: Date of Service: Tippin, [name] 12/15/2020 8:00 A M Medical Record Number: [id number] Patient Account Number: [id number] Date of Birth/Sex: Treating RN: Jul 25, 1986 (35 y.o. [name] Primary Care Ronda Rajkumar: [place], [place] Other Clinician: Referring Adarrius Graeff: Treating Darlina Mccaughey/Extender: Linton Ham  [name], [name]: 258 Active Problems Location of Pain Severity and Description of Pain Patient Has Paino No Site Locations Rate the pain. Current Pain Level: 0 Pain Management and Medication Current Pain Management: Electronic Signature(s) Signed: 12/17/2020 5:56:51 PM By: Rhae Hammock RN Entered By: Rhae Hammock on 12/15/2020  08:04:07 -------------------------------------------------------------------------------- Patient/Caregiver Education Details Patient Name: Date of Service: Hornstein, A Viviann Spare 1/24/2022andnbsp8:00 A M Medical Record Number: [id number] Patient Account Number: [id number] Date of Birth/Gender: Treating RN: [birth year] (35 y.o. [name] Primary Care Physician: Janine Limbo Other Clinician: Referring Physician: Treating Physician/Extender: [name]: 1 Education Assessment Education Provided To: Patient Education Topics Provided Wound/Skin Impairment: Handouts: Skin Care Do's and Dont's Methods: Explain/Verbal Responses: Reinforcements needed Electronic Signature(s) Signed: 12/15/2020 6:39:14 PM By: Deon Pilling Entered By: Deon Pilling on 12/15/2020 08:21:06 -------------------------------------------------------------------------------- Wound Assessment Details Patient Name: Date of Service: Schomburg, [name] 12/15/2020 8:00 A M Medical Record Number: [id number] Patient Account Number: [id number] Date of Birth/Sex: Treating RN: January 01, 1986 (35 y.o. [name] Primary Care Vale Mousseau: [name], [name] Other Clinician: Referring Mithcell Schumpert: Treating Iver Miklas/Extender: [name]: 258 Wound Status Wound Number: 38R Primary Etiology: Inflammatory Wound Location: Right T - Web between 1st and 2nd oe Wound Status: Open Wounding Event: Gradually Appeared Comorbid History: Sleep Apnea, Hypertension, Paraplegia Date Acquired: 11/30/2019 Weeks Of Treatment: 54 Clustered Wound: No Photos Photo Uploaded By: Mikeal Hawthorne on 12/16/2020 13:16:35 Wound Measurements Length: (cm) 0.4 Width: (cm) 0.2 Depth: (cm) 0.2 Area: (cm) 0.063 Volume: (cm) 0.013 % Reduction in Area: 80.9% % Reduction in Volume: 94.4% Epithelialization: Large (67-100%) Tunneling: No Undermining: No Wound  Description Classification: Full Thickness Without Exposed Support Structures Wound Margin: Flat and Intact Exudate Amount: Small Exudate Type: Serous Exudate Color: amber Foul Odor After Cleansing: No Slough/Fibrino Yes Wound Bed Granulation Amount: Medium (34-66%) Exposed Structure Granulation Quality: Pink Fascia Exposed: No Necrotic Amount: Medium (34-66%) Fat Layer (Subcutaneous Tissue) Exposed: Yes Necrotic Quality: Adherent Slough Tendon Exposed: No Muscle Exposed: No Joint Exposed: No Bone Exposed: No Treatment Notes Wound #38R (Toe - Web between 1st and 2nd) Wound Laterality: Right Cleanser Soap and Water Discharge Instruction: May shower and wash wound with dial antibacterial soap and water prior to dressing change. Peri-Wound Care Sween Lotion (Moisturizing lotion) Discharge Instruction: Apply moisturizing lotion as directed Topical Primary Dressing KerraCel Ag Gelling Fiber Dressing, 2x2 in (silver alginate) Discharge Instruction: Apply silver alginate to wound bed as instructed Secondary Dressing Woven Gauze Sponge, Non-Sterile 4x4 in Discharge Instruction: Apply over primary dressing as directed. Secured With [employer], 4.5x3.1 (in/yd) Discharge Instruction: Secure with Kerlix as directed. Compression Wrap Compression Stockings Add-Ons Electronic Signature(s) Signed: 12/17/2020 5:56:51 PM By: Rhae Hammock RN Entered By: Rhae Hammock on 12/15/2020 08:15:25 -------------------------------------------------------------------------------- Wound Assessment Details Patient Name: Date of Service: Westling, [name] 12/15/2020 8:00 A M Medical Record Number: [id number] Patient Account Number: [id number] Date of Birth/Sex: Treating RN: Nov 05, 1986 (35 y.o. [name] Primary Care Darragh Nay: [place], [place] Other Clinician: Referring Tegan Britain: Treating Humna Moorehouse/Extender: [name]: 258 Wound  Status Wound Number: 41R Primary Etiology: Pressure Ulcer Wound Location: Left Ischium Wound Status: Open Wounding Event: Gradually Appeared Comorbid History: Sleep Apnea, Hypertension, Paraplegia Date Acquired: 03/16/2020 Weeks Of Treatment: 39 Clustered Wound: Yes Photos Photo Uploaded By: Mikeal Hawthorne on 12/16/2020 13:16:57 Wound Measurements Length: (cm) 4.1 Width: (cm) 3.5 Depth: (cm) 0.1 Clustered Quantity: 3 Area: (cm) 11.27 Volume: (cm) 1.127 %  Reduction in Area: -524% % Reduction in Volume: -522.7% Epithelialization: Medium (34-66%) Tunneling: No Undermining: No Wound Description Classification: Category/Stage II Wound Margin: Distinct, outline attached Exudate Amount: Medium Exudate Type: Serosanguineous Exudate Color: red, brown Foul Odor After Cleansing: No Slough/Fibrino Yes Wound Bed Granulation Amount: Large (67-100%) Exposed Structure Granulation Quality: Red, Friable Fascia Exposed: No Necrotic Amount: None Present (0%) Fat Layer (Subcutaneous Tissue) Exposed: Yes Tendon Exposed: No Muscle Exposed: No Joint Exposed: No Bone Exposed: No Treatment Notes Wound #41R (Ischium) Wound Laterality: Left Cleanser Soap and Water Discharge Instruction: May shower and wash wound with dial antibacterial soap and water prior to dressing change. Peri-Wound Care Triad Hydrophilic Wound Dressing Tube, 6 (oz) Discharge Instruction: Apply to periwound with each dressing change Topical Primary Dressing KerraCel Ag Gelling Fiber Dressing, 4x5 in (silver alginate) Discharge Instruction: Apply silver alginate to wound bed as instructed Secondary Dressing ComfortFoam Border, 4x4 in (silicone border) Discharge Instruction: Apply over primary dressing as directed. Secured With Compression Wrap Compression Stockings Environmental education officer) Signed: 12/17/2020 5:56:51 PM By: Rhae Hammock RN Entered By: Rhae Hammock on 12/15/2020  08:14:49 -------------------------------------------------------------------------------- Wound Assessment Details Patient Name: Date of Service: Armendarez, [name] 12/15/2020 8:00 A M Medical Record Number: [id number] Patient Account Number: [id number] Date of Birth/Sex: Treating RN: [birth year] (35 y.o. [name] Primary Care Domenico Achord: [name], [name] Other Clinician: Referring Kieon Lawhorn: Treating Cas Tracz/Extender: [name]: 258 Wound Status Wound Number: 47 Primary Etiology: Pressure Ulcer Wound Location: Right, Dorsal Foot Wound Status: Open Wounding Event: Gradually Appeared Comorbid History: Sleep Apnea, Hypertension, Paraplegia Date Acquired: 11/02/2020 Weeks Of Treatment: 6 Clustered Wound: No Photos Photo Uploaded By: Mikeal Hawthorne on 12/16/2020 13:16:13 Wound Measurements Length: (cm) 0.7 Width: (cm) 2.8 Depth: (cm) 0.1 Area: (cm) 1.539 Volume: (cm) 0.154 % Reduction in Area: 49.1% % Reduction in Volume: 49% Epithelialization: Small (1-33%) Tunneling: No Undermining: No Wound Description Classification: Category/Stage II Wound Margin: Distinct, outline attached Exudate Amount: Medium Exudate Type: Serosanguineous Exudate Color: red, brown Foul Odor After Cleansing: No Slough/Fibrino Yes Wound Bed Granulation Amount: Medium (34-66%) Exposed Structure Granulation Quality: Red, Pink Fascia Exposed: No Necrotic Amount: Medium (34-66%) Fat Layer (Subcutaneous Tissue) Exposed: No Necrotic Quality: Adherent Slough Tendon Exposed: No Muscle Exposed: No Joint Exposed: No Bone Exposed: No Treatment Notes Wound #47 (Foot) Wound Laterality: Dorsal, Right Cleanser Soap and Water Discharge Instruction: May shower and wash wound with dial antibacterial soap and water prior to dressing change. Peri-Wound Care Topical ammonium lactate Discharge Instruction: apply between the toes, plantar, and dorsal foot with  dressing changes. Pick up at pharmacy. Primary Dressing KerraCel Ag Gelling Fiber Dressing, 2x2 in (silver alginate) Discharge Instruction: Apply silver alginate to wound bed as instructed Secondary Dressing Woven Gauze Sponge, Non-Sterile 4x4 in Discharge Instruction: Apply over primary dressing as directed. Secured With [employer], 4.5x3.1 (in/yd) Discharge Instruction: Secure with Kerlix as directed. Compression Wrap Compression Stockings Add-Ons Electronic Signature(s) Signed: 12/17/2020 5:56:51 PM By: Rhae Hammock RN Entered By: Rhae Hammock on 12/15/2020 08:13:54 -------------------------------------------------------------------------------- Wound Assessment Details Patient Name: Date of Service: Wolter, [name] 12/15/2020 8:00 A M Medical Record Number: [id number] Patient Account Number: [id number] Date of Birth/Sex: Treating RN: 04/07/1986 (34 y.o. [name] Primary Care Paysley Poplar: [place], [place] Other Clinician: Referring Atharva Mirsky: Treating Pearson Reasons/Extender: [name]: 258 Wound Status Wound Number: 48 Primary Etiology: Pressure Ulcer Wound Location: Right, Anterior Lower Leg Wound Status: Open Wounding Event: Gradually Appeared Comorbid History: Sleep  Apnea, Hypertension, Paraplegia Date Acquired: 12/08/2020 Weeks Of Treatment: 0 Clustered Wound: No Photos Photo Uploaded By: Mikeal Hawthorne on 12/16/2020 13:16:57 Wound Measurements Length: (cm) 0.7 Width: (cm) 0.5 Depth: (cm) 0.2 Area: (cm) 0.275 Volume: (cm) 0.055 % Reduction in Area: % Reduction in Volume: Epithelialization: Small (1-33%) Tunneling: No Undermining: No Wound Description Classification: Category/Stage II Wound Margin: Distinct, outline attached Exudate Amount: Medium Exudate Type: Serosanguineous Exudate Color: red, brown Foul Odor After Cleansing: No Slough/Fibrino No Wound Bed Granulation Amount: Large (67-100%)  Exposed Structure Granulation Quality: Red, Pink Fascia Exposed: No Necrotic Amount: None Present (0%) Fat Layer (Subcutaneous Tissue) Exposed: No Tendon Exposed: No Muscle Exposed: No Joint Exposed: No Bone Exposed: No Treatment Notes Wound #48 (Lower Leg) Wound Laterality: Right, Anterior Cleanser Peri-Wound Care Topical Primary Dressing Secondary Dressing Secured With Compression Wrap Compression Stockings Add-Ons Electronic Signature(s) Signed: 12/17/2020 5:56:51 PM By: Rhae Hammock RN Entered By: Rhae Hammock on 12/15/2020 08:12:58 -------------------------------------------------------------------------------- Vitals Details Patient Name: Date of Service: Tristan, [name] 12/15/2020 8:00 A M Medical Record Number: [id number] Patient Account Number: [id number] Date of Birth/Sex: Treating RN: 1985-12-25 (35 y.o. [name] Primary Care Vaniya Augspurger: [place], [place] Other Clinician: Referring Wanetta Funderburke: Treating Dre Gamino/Extender: [name]: 258 Vital Signs Time Taken: 08:04 Temperature (F): 98.2 Height (in): 70 Pulse (bpm): 107 Weight (lbs): 216 Respiratory Rate (breaths/min): 17 Body Mass Index (BMI): 31 Blood Pressure (mmHg): 137/74 Reference Range: 80 - 120 mg / dl Electronic Signature(s) Signed: 12/17/2020 5:56:51 PM By: Rhae Hammock RN Entered By: Rhae Hammock on 12/15/2020 08:07:23

## 2020-12-29 ENCOUNTER — Encounter (HOSPITAL_BASED_OUTPATIENT_CLINIC_OR_DEPARTMENT_OTHER): Payer: 59 | Attending: Internal Medicine | Admitting: Internal Medicine

## 2020-12-29 ENCOUNTER — Other Ambulatory Visit: Payer: Self-pay

## 2020-12-29 DIAGNOSIS — L03115 Cellulitis of right lower limb: Secondary | ICD-10-CM | POA: Diagnosis not present

## 2020-12-29 DIAGNOSIS — L97511 Non-pressure chronic ulcer of other part of right foot limited to breakdown of skin: Secondary | ICD-10-CM | POA: Insufficient documentation

## 2020-12-29 DIAGNOSIS — I890 Lymphedema, not elsewhere classified: Secondary | ICD-10-CM | POA: Diagnosis not present

## 2020-12-29 DIAGNOSIS — G8221 Paraplegia, complete: Secondary | ICD-10-CM | POA: Insufficient documentation

## 2020-12-29 DIAGNOSIS — L97318 Non-pressure chronic ulcer of right ankle with other specified severity: Secondary | ICD-10-CM | POA: Insufficient documentation

## 2020-12-29 DIAGNOSIS — L89323 Pressure ulcer of left buttock, stage 3: Secondary | ICD-10-CM | POA: Insufficient documentation

## 2020-12-29 DIAGNOSIS — I872 Venous insufficiency (chronic) (peripheral): Secondary | ICD-10-CM | POA: Insufficient documentation

## 2020-12-30 NOTE — Progress Notes (Signed)
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 06/16/17 requires weekly debridements now on the left lateral leg although this continues to  contract. I changed to silver collagen last week because of dryness of the wound bed. Using Iodoflex to the areas on his first and second toes/web space bilaterally 06/24/17; patient with history of paraplegia also chronic venous insufficiency with lymphedema. Has [name] very difficult wound on the left lateral leg. This has been gradually reducing in terms of with but comes in with [name] very dry adherent surface. High switch to silver collagen [name] week or so ago with hydrogel to keep the area moist. This is been refractory to multiple dressing attempts. He also has areas in his first and second toes bilaterally in the anterior and posterior web space. I had been using Iodoflex here after [name] prolonged course of silver alginate with ketoconazole was ineffective [question tinea pedis] 07/14/17; patient arrives today with [name] very difficult adherent material over his left lateral lower leg wound. He also has surrounding erythema and poorly controlled edema. He was switched his Santyl last visit which the nurses are applying once during his doctor visit and once on [name] nurse visit. He was also reduced to 2 layer compression I'm not exactly sure of the issue here. 07/21/17; better surface today after 1 week of Iodoflex. Significant cellulitis that we treated last week also better. [Doxycycline] 07/28/17 better surface today with now 2 weeks of Iodoflex. Significant cellulitis treated with doxycycline. He has now completed the doxycycline and he is back to his usual degree of chronic venous inflammation/stasis dermatitis. He reminds me he has had ablations surgery here 08/04/17; continued improvement with Iodoflex to the left lateral leg wound in  terms of the surface of the wound although the dimensions are better. 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 Problematic areas on the plantar aspect of the first second toe web space on the left and the dorsal  aspect of the first second toe web space on the right. At one point I felt these were probably related to chronic fungal infections in treated him aggressively for this although we have not made any improvement here. 08/11/17; left lateral leg. Surface continues to improve with the Iodoflex although we are not seeing much improvement in overall wound dimensions. Areas on his plantar left foot and right foot show no improvement. In fact the right foot looks somewhat worse 08/18/17; left lateral leg. We changed to [facility] Blue last week after [name] prolonged course of Iodoflex which helps get the surface better. It appears that the wound with is improved. Continue with difficult areas on the left dorsal first second and plantar first second on the right 09/01/17; patient arrives in clinic today having had [name] temperature of 103 yesterday. He was seen in the ER and [facility]. The patient was concerned he could have cellulitis again in the right leg however they diagnosed him with [name] UTI and he is now on Keflex. He has [name] history of cellulitis which is been recurrent and difficult but this is been in the left leg, in the past 5 use doxycycline. He does in and out catheterizations at home which are risk factors for UTI 09/08/17; patient will be completing his Keflex this weekend. The erythema on the left leg is considerably better. He has [name] new wound today on the medial part of the right leg small superficial almost looks like [name] skin tear. He has worsening of the area on the right dorsal first and second toe. His major area on the left lateral leg is better. Using Hydrofera Blue on all areas 09/15/17; gradual reduction in width on the long wound in the left lateral leg. No debridement required. He also has wounds on the plantar aspect of his left first second toe web space and on the dorsal aspect of the right first second toe web space. 09/22/17; there continues to be very gradual improvements in the dimensions of  the left lateral leg wound. He hasn't round erythematous spot with might be pressure on his wheelchair. There is no evidence obviously of infection no purulence no warmth He has [name] dry scaled area on the plantar aspect of the left first second toe Improved area on the dorsal right first second toe. 09/29/17; left lateral leg wound continues to improve in dimensions mostly with an is still [name] fairly long but increasingly narrow wound. He has [name] dry scaled area on the plantar aspect of his left first second toe web space Increasingly concerning area on the dorsal right first second toe. In fact I am concerned today about possible cellulitis around this wound. The areas extending up his second toe and although there is deformities here almost appears to abut on the nailbed. 10/06/17; left lateral leg wound continues to make very gradual progress. Tissue culture I did from the right first second toe dorsal foot last time grew MRSA and enterococcus which was vancomycin sensitive. This was not sensitive to clindamycin or doxycycline. He is allergic to Zyvox and sulfa we have therefore arrange for him to have dalvance infusion tomorrow. He is had this in the past and tolerated it well 10/20/17; left  lateral leg wound continues to make decent progress. This is certainly reduced in terms of with there is advancing epithelialization.The cellulitis in the right foot looks better although he still has [name] deep wound in the dorsal aspect of the first second toe web space. Plantar left first toe web space on the left I think is making some progress 10/27/17; left lateral leg wound continues to make decent progress. Advancing epithelialization.using Hydrofera Blue The right first second toe web space wound is better-looking using silver alginate Improvement in the left plantar first second toe web space. Again using silver alginate 11/03/17 left lateral leg wound continues to make decent progress albeit slowly. Using  [facility] The right per second toe web space continues to be [name] very problematic looking punched out wound. I obtained [name] piece of tissue for deep culture I did extensively treated this for fungus. It is difficult to imagine that this is [name] pressure area as the patient states other than going outside he doesn't really wear shoes at home The left plantar first second toe web space looked fairly senescent. Necrotic edges. This required debridement change to [facility] Blue to all wound areas 11/10/17; left lateral leg wound continues to contract. Using Hydrofera Blue On the right dorsal first second toe web space dorsally. Culture I did of this area last week grew MRSA there is not an easy oral option in this patient was multiple antibiotic allergies or intolerances. This was only [name] rare culture isolate I'm therefore going to use Bactroban under silver alginate On the left plantar first second toe web space. Debridement is required here. This is also unchanged 11/17/17; left lateral leg wound continues to contract using Hydrofera Blue this is no longer the major issue. The major concern here is the right first second toe web space. He now has an open area going from dorsally to the plantar aspect. There is now wound on the inner lateral part of the first toe. Not [name] very viable surface on this. There is erythema spreading medially into the forefoot. No major change in the left first second toe plantar wound 11/24/17; left lateral leg wound continues to contract using Hydrofera Blue. Nice improvement today The right first second toe web space all of this looks [name] lot less angry than last week. I have given him clindamycin and topical Bactroban for MRSA and terbinafine for the possibility of underlining tinea pedis that I could not control with ketoconazole. Looks somewhat better The area on the plantar left first second toe web space is weeping with dried debris around the wound 12/01/17; left lateral leg  wound continues to contract he Hydrofera Blue. It is becoming thinner in terms of with nevertheless it is making good improvement. The right first second toe web space looks less angry but still [name] large necrotic-looking wounds starting on the plantar aspect of the right foot extending between the toes and now extensively on the base of the right second toe. I gave him clindamycin and topical Bactroban for MRSA anterior benefiting for the possibility of underlying tinea pedis. Not looking better today The area on the left first/second toe looks better. Debrided of necrotic debris 12/05/17* the patient was worked in urgently today because over the weekend he found blood on his incontinence bad when he woke up. He was found to have an ulcer by his wife who does most of his wound care. He came in today for [place] to look at this. He has not had [name] history of  wounds in his buttocks in spite of his paraplegia. 12/08/17; seen in follow-up today at his usual appointment. He was seen earlier this week and found to have [name] new wound on his buttock. We also follow him for wounds on the left lateral leg, left first second toe web space and right first second toe web space 12/15/17; we have been using Hydrofera Blue to the left lateral leg which has improved. The right first second toe web space has also improved. Left first second toe web space plantar aspect looks stable. The left buttock has worsened using Santyl. Apparently the buttock has drainage 12/22/17; we have been using Hydrofera Blue to the left lateral leg which continues to improve now 2 small wounds separated by normal skin. He tells [place] he had [name] fever up to 100 yesterday he is prone to UTIs but has not noted anything different. He does in and out catheterizations. The area between the first and second toes today does not look good necrotic surface covered with what looks to be purulent drainage and erythema extending into the third toe. I had gotten this to  something that I thought look better last time however it is not look good today. He also has [name] necrotic surface over the buttock wound which is expanded. I thought there might be infection under here so I removed [name] lot of the surface with [name] #5 curet though nothing look like it really needed culturing. He is been using Santyl to this area 12/27/17; his original wound on the left lateral leg continues to improve using Hydrofera Blue. I gave him samples of Baxdella although he was unable to take them out of fear for an allergic reaction ["lump in his throat"].the culture I did of the purulent drainage from his second toe last week showed both enterococcus and [name] set Enterobacter I was also concerned about the erythema on the bottom of his foot although paradoxically although this looks somewhat better today. 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 We've been using silver alginate here. They came up today that he sleeps in [name] recliner, I'm not sure why but I asked him to stop this 01/03/18; his original wound we've been using Hydrofera Blue is now separated into 2 areas. Ulcer on his left buttock is better he is off the recliner and sleeping in bed Finally both wound areas between his first and second toes also looks some better 01/10/18; his original wound on the left lateral leg is now separated into 2 wounds we've been using Hydrofera Blue Ulcer on his left buttock has some drainage. There is [name] small probing site going into muscle layer superiorly.using silver alginate -He arrives today with [name] deep tissue injury on the left heel The wound on the dorsal aspect of his first second toe on the left looks [name] lot betterusing silver alginate ketoconazole The area on the first second toe web space on the right also looks [name] lot bette 01/17/18; his original wound on the left lateral leg continues to progress using  Hydrofera Blue Ulcer on his left buttock also is smaller surface healthier except for [name] small probing site going into the muscle layer superiorly. 2.4 cm of tunneling in this area DTI on his left heel we have only been offloading. Looks better than last week no threatened open no evidence of infection the wound on the dorsal aspect of the first second toe  on the left continues to look like it's regressing we have only been using silver alginate and terbinafine orally The area in the first second toe web space on the right also looks to be [name] lot better using silver alginate and terbinafine I think this was prompted by tinea pedis 01/31/18; the patient was hospitalized in [place] last week apparently for [name] complicated UTI. He was discharged on cefepime he does in and out catheterizations. In the hospital he was discovered M I don't mild elevation of AST and ALT and the terbinafine was stopped.predictably the pressure ulcer on s his buttock looks betterusing silver alginate. The area on the left lateral leg also is better using Hydrofera Blue. The area between the first and second toes on the left better. First and second toes on the right still substantial but better. Finally the DTI on the left heel has held together and looks like it's resolving 02/07/18-he is here in follow-up evaluation for multiple ulcerations. He has new injury to the lateral aspect of the last issue [name] pressure ulcer, he states this is from adhesive removal trauma. He states he has tried multiple adhesive products with no success. All other ulcers appear stable. The left heel DTI is resolving. We will continue with same treatment plan and follow-up next week. 02/14/18; follow-up for multiple areas. He has [name] new area last week on the lateral aspect of his pressure ulcer more over the posterior trochanter. The original pressure ulcer looks quite stable has healthy granulation. 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 Almost fully epithelialized on the original superior area using Hydrofera Blue DTI on the left heel has peeled off this week to reveal [name] small superficial wound under denuded skin and subcutaneous tissue Both areas between the first and second toes look better including nothing open on the left 02/21/18; The patient's wounds on his left ischial tuberosity and posterior left greater trochanter actually looked better. He has [name] large area of irritation around the area which I think is contact dermatitis. I am doubtful that this is fungal His original wound on the left lateral calf continues to improve we have been using Hydrofera Blue There is no open area in the left first second toe web space although there is [name] lot of thick callus The DTI on the left heel required debridement today of necrotic surface eschar and subcutaneous tissue using silver alginate Finally the area on the right first second toe webspace continues to contract using silver alginate and ketoconazole 02/28/18 Left ischial tuberosity wounds look better using silver alginate. Original wound on the left calf only has one small open area left using Hydrofera Blue DTI on the left heel required debridement mostly removing skin from around this wound surface. Using silver alginate The areas on the right first/second toe web space using silver alginate and ketoconazole 03/08/18 on evaluation today patient appears to be doing decently well as best I can tell in regard to his wounds. This is the first time that I have seen him as he generally is followed by Dr. Dellia Nims. With that being said none of his wounds appear to be infected he does have an area where there is some skin covering what appears to be [name] new wound on the left dorsal surface of his great toe. This is right at the nail bed. With that being said I do believe that debrided away some  of  the excess skin can be of benefit in this regard. Otherwise he has been tolerating the dressing changes without complication. 03/14/18; patient arrives today with the multiplicity of wounds that we are following. He has not been systemically unwell Original wound on the left lateral calf now only has 2 small open areas we've been using Hydrofera Blue which should continue The deep tissue injury on the left heel requires debridement today. We've been using silver alginate The left first second toe and the right first second toe are both are reminiscence what I think was tinea pedis. Apparently some of the callus Surface between the toes was removed last week when it started draining. Purulent drainage coming from the wound on the ischial tuberosity on the left. 03/21/18-He is here in follow-up evaluation for multiple wounds. 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 He tolerated debridement. The only change to last week's recommendations is to discontinue antifungal cream between toes. He will follow-up next week 03/28/18; following up for multiple wounds;Concern this week is streaking redness and swelling in the right foot. He is going to need antibiotics for this. 03/31/18; follow-up for right foot cellulitis. Streaking redness and swelling in the right foot on 03/28/18. He has multiple antibiotic intolerances and [name] history of MRSA. I put him on clindamycin 300 mg every 6 and brought him in for [name] quick check. He has an open wound between his first and second toes on the right foot as [name] potential source. 04/04/18; Right foot cellulitis is resolving he is completing clindamycin. This is truly good news Left lateral calf wound which is initial wound only has one small open area inferiorly this is close to healing out. He has compression stockings. We will use Hydrofera Blue right down to the epithelialization of this Nonviable  surface on the left heel which was initially pressure with [name] DTI. We've been using Hydrofera Blue. I'm going to switch this back to silver alginate Left first second toe/tinea pedis this looks better using silver alginate Right first second toe tinea pedis using silver alginate Large pressure ulcers on theLeft ischial tuberosity. Small wound here Looks better. I am uncertain about the surface over the large wound. Using silver alginate 04/11/18; Cellulitis in the right foot is resolved Left lateral calf wound which was his original wounds still has 2 tiny open areas remaining this is just about closed Nonviable surface on the left heel is better but still requires debridement Left first second toe/tinea pedis still open using silver alginate Right first second toe wound tinea pedis I asked him to go back to using ketoconazole and silver alginate Large pressure ulcers on the left ischial tuberosity this shear injury here is resolved. Wound is smaller. No evidence of infection using silver alginate 04/18/18; Patient arrives with an intense area of cellulitis in the right mid lower calf extending into the right heel area. Bright red and warm. Smaller area on the left anterior leg. He has [name] significant history of MRSA. He will definitely need antibioticsdoxycycline He now has 2 open areas on the left ischial tuberosity the original large wound and now [name] satellite area which I think was above his initial satellite areas. Not [name] wonderful surface on this satellite area surrounding erythema which looks like pressure related. His left lateral calf wound again his original wound is just about closed Left heel pressure injury still requiring debridement Left first second toe looks [name] lot better using  silver alginate Right first second toe also using silver alginate and ketoconazole cream also looks better 04/20/18; the patient was worked in early today out of concerns with his cellulitis on the right leg. I  had started him on doxycycline. This was 2 days ago. His wife was concerned about the swelling in the area. Also concerned about the left buttock. He has not been systemically unwell no fever chills. No nausea vomiting or diarrhea 04/25/18; the patient's left buttock wound is continued to deteriorate he is using Hydrofera Blue. He is still completing clindamycin for the cellulitis on the right leg although all of this looks better. 05/02/18 Left buttock wound still with [name] lot of drainage and [name] very tightly adherent fibrinous necrotic surface. He has [name] deeper area superiorly The left lateral calf wound is still closed DTI wound on the left heel necrotic surface especially the circumference using Iodoflex Areas between his left first second toe and right first second toe both look better. Dorsally and the right first second toe he had [name] necrotic surface although at smaller. In using silver alginate and ketoconazole. I did [name] culture last week which was [name] deep tissue culture of the reminiscence of the open wound on the right first second toe dorsally. This grew [name] few Acinetobacter and [name] few methicillin-resistant staph aureus. Nevertheless the area actually this week looked better. I didn't feel the need to specifically address this at least in terms of systemic antibiotics. 05/09/18; wounds are measuring larger more drainage per our intake. We are using Santyl covered with alginate on the large superficial buttock wounds, Iodosorb on the left heel, ketoconazole and silver alginate to the dorsal first and second toes bilaterally. 05/16/18; The area on his left buttock better in some aspects although the area superiorly over the ischial tuberosity required an extensive debridement.using Santyl Left heel appears stable. Using Iodoflex The areas between his first and second toes are not bad however there is spreading erythema up the dorsal aspect of his left foot this looks like cellulitis again. He is  insensate the erythema is really very brilliant.o Erysipelas He went to see an allergist days ago because he was itching part of this he had lab work done. This showed [name] white count of 15.1 with 70% neutrophils. Hemoglobin of 11.4 and [name] platelet count of 659,000. Last white count we had in Epic was [name] 2-1/2 years ago which was 25.9 but he was ill at the time. He was able to show me some lab work that was done by his primary physician the pattern is about the same. 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 But prompted me to go ahead and do x-rays of both feet and the pelvis rule out osteomyelitis. He also had [name] comprehensive metabolic panel this was reasonably normal his albumin was 3.7 liver function tests BUN/creatinine all normal 05/23/18; x-rays of both his feet from last week were negative for underlying pulmonary abnormality. The x-ray of his pelvis however showed mild irregularity in the left ischial which may represent some early osteomyelitis. The wound in the left ischial continues to get deeper clearly now exposed muscle. Each week necrotic surface material over this area. Whereas the rest of the wounds do not look so bad. The left ischial wound we have been using Santyl and calcium alginate T the left heel surface necrotic debris using Iodoflex o The left lateral leg is still healed Areas on the left dorsal  foot and the right dorsal foot are about the same. There is some inflammation on the left which might represent contact dermatitis, fungal dermatitis I am doubtful cellulitis although this looks better than last week 05/30/18; CT scan done at Hospital did not show any osteomyelitis or abscess. Suggested the possibility of underlying cellulitis although I don't see [name] lot of evidence of this at the bedside The wound itself on the left buttock/upper thigh actually looks somewhat better. No debridement Left heel also looks better no debridement  continue Iodoflex Both dorsal first second toe spaces appear better using Lotrisone. Left still required debridement 06/06/18; Intake reported some purulent looking drainage from the left gluteal wound. Using Santyl and calcium alginate Left heel looks better although still [name] nonviable surface requiring debridement The left dorsal foot first/second webspace actually expanding and somewhat deeper. I may consider doing [name] shave biopsy of this area Right dorsal foot first/second webspace appears stable to improved. Using Lotrisone and silver alginate to both these areas 06/13/18 Left gluteal surface looks better. Now separated in the 2 wounds. No debridement required. Still drainage. We'll continue silver alginate Left heel continues to look better with Iodoflex continue this for at least another week Of his dorsal foot wounds the area on the left still has some depth although it looks better than last week. We've been using Lotrisone and silver alginate 06/20/18 Left gluteal continues to look better healthy tissue Left heel continues to look better healthy granulation wound is smaller. He is using Iodoflex and his long as this continues continue the Iodoflex Dorsal right foot looks better unfortunately dorsal left foot does not. There is swelling and erythema of his forefoot. He had minor trauma to this several days ago but doesn't think this was enough to have caused any tissue injury. Foot looks like cellulitis, we have had this problem before 06/27/18 on evaluation today patient appears to be doing [name] little worse in regard to his foot ulcer. 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 Both of which would really be his only options for treating this infection. In the past he has been given and effusion of Orbactiv. This is done very well for him in the past again  it's one time dosing IV antibiotic therapy. Subsequently I do believe this is something we're gonna need to see about doing at this point in time. Currently his other wounds seem to be doing somewhat better in my pinion I'm pretty happy in that regard. 07/03/18 on evaluation today patient's wounds actually appear to be doing fairly well. He has been tolerating the dressing changes without complication. All in all he seems to be showing signs of improvement. In regard to the antibiotics he has been dealing with infectious disease since I saw him last week as far as getting this scheduled. In the end [facility]'s [facility] this coming Friday. In the meantime he has been continuing to perform the dressing changes in such as previous. There does not appear to be any evidence of infection worsengin at this time. 07/10/18; Since I last saw this man 2 weeks ago things have actually improved. IV antibiotics of resulted in less forefoot erythema although there is still some present. He is not systemically unwell Left buttock wounds 2 now have no depth there is  increased epithelialization Using silver alginate Left heel still requires debridement using Iodoflex Left dorsal foot still with [name] sizable wound about the size of [name] border but healthy granulation Right dorsal foot still with [name] slitlike area using silver alginate 07/18/18; the patient's cellulitis in the left foot is improved in fact I think it is on its way to resolving. Left buttock wounds 2 both look better although the larger one has hypertension granulation we've been using silver alginate Left heel has some thick circumferential redundant skin over the wound edge which will need to be removed today we've been using Iodoflex Left dorsal foot is still [name] sizable wound required debridement using silver alginate The right dorsal foot is just about closed only [name] small open area remains here 07/25/18;  left foot cellulitis is resolved Left buttock wounds 2 both look better. Hyper-granulation on the major area Left heel as some debris over the surface but otherwise looks [name] healthier wound. Using silver collagen Right dorsal foot is just about closed 07/31/18; arrives with our intake nurse worried about purulent drainage from the buttock. We had hyper-granulation here last week His buttock wounds 2 continue to look better Left heel some debris over the surface but measuring smaller. Right dorsal foot unfortunately has openings between the toes Left foot superficial wound looks less aggravated. 08/07/18 Buttock wounds continue to look better although some of her granulation and the larger medial wound. silver alginate Left heel continues to look [name] lot better.silver collagen Left foot superficial wound looks less stable. Requires debridement. He has [name] new wound superficial area on the foot on the lateral dorsal foot. Right foot looks better using silver alginate without Lotrisone 08/14/2018; patient was in the ER last week diagnosed with [name] UTI. He is now on Cefpodoxime and Macrodantin. Buttock wounds continued to be smaller. Using silver alginate Left heel continues to look better using silver collagen Left foot superficial wound looks as though it is improving Right dorsal foot area is just about healed. 08/21/2018; patient is completed his antibiotics for his UTI. He has 2 open areas on the buttocks. There is still not closed although the surface looks satisfactory. Using silver alginate Left heel continues to improve using silver collagen The bilateral dorsal foot areas which are at the base of his first and second toes/possible tinea pedis are actually stable on the left but worse on the right. The area on the left required debridement of necrotic surface. After debridement I obtained [name] specimen for PCR culture. The right dorsal foot which is been just about healed last week is now  reopened 08/28/2018; culture done on the left dorsal foot showed coag negative staph both staph epidermidis and Lugdunensis. I think this is worthwhile initiating systemic treatment. I will use doxycycline given his long list of allergies. The area on the left heel slightly improved but still requiring debridement. The large wound on the buttock is just about closed whereas the smaller one is larger. Using silver alginate in this area 09/04/2018; patient is completing his doxycycline for the left foot although this continues to be [name] very difficult wound area with very adherent necrotic debris. We are using silver alginate to all his wounds right foot left foot and the small wounds on his buttock, silver collagen on the left heel. 09/11/2018; once again this patient has intense erythema and swelling of the left forefoot. Lesser degrees of erythema in the right foot. He has [name] long list of allergies and intolerances. I will reinstitute doxycycline.  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 Using silver alginate Left heel also looks better using silver collagen Unfortunately both the areas on his feet look worse. The area on the left first second webspace is now gone through to the plantar part of his foot. The area on the left foot anteriorly is irritated with erythema and swelling in the forefoot. 09/25/2018 His wound on the left plantar heel looks better. Using silver collagen The area on the left buttock 2 small remnant areas. One is closed one is still open. Using silver alginate The areas between both his first and second toes look worse. This in spite of long-standing antifungal therapy with ketoconazole and silver alginate which should have antifungal activity He has small areas around his original wound on the left calf one is on the bottom of the original scar tissue and one superiorly both of these are small and superficial but again given wound  history in this site this is worrisome 10/02/2018 Left plantar heel continues to gradually contract using silver collagen Left buttock wound is unchanged using silver alginate The areas on his dorsal feet between his first and second toes bilaterally look about the same. I prescribed clindamycin ointment to see if we can address chronic staph colonization and also the underlying possibility of erythrasma The left lateral lower extremity wound is actually on the lateral part of his ankle. Small open area here. We have been using silver alginate 10/09/2018; Left plantar heel continues to look healthy and contract. No debridement is required Left buttock slightly smaller with [name] tape injury wound just below which was new this week Dorsal feet somewhat improved I have been using clindamycin Left lateral looks lower extremity the actual open area looks worse although [name] lot of this is epithelialized. I am going to change to silver collagen today He has [name] lot more swelling in the right leg although this is not pitting not red and not particularly warm there is [name] lot of spasm in the right leg usually indicative of people with paralysis of some underlying discomfort. We have reviewed his vascular status from 2017 he had [name] left greater saphenous vein ablation. I wonder about referring him back to vascular surgery if the area on the left leg continues to deteriorate. 10/16/2018 in today for follow-up and management of multiple lower extremity ulcers. His left Buttock wound is much lower smaller and almost closed completely. The wound to the left ankle has began to reopen with Epithelialization and some adherent slough. He has multiple new areas to the left foot and leg. The left dorsal foot without much improvement. Wound present between left great webspace and 2nd toe. Erythema and edema present right leg. Right LE ultrasound obtained on 10/10/18 was negative for DVT . 10/23/2018; Left buttock is closed  over. Still dry macerated skin but there is no open wound. I suspect this is chronic pressure/moisture Left lateral calf is quite [name] bit worse than when I saw this last. There is clearly drainage here he has macerated skin into the left plantar heel. We will change the primary dressing to alginate Left dorsal foot has some improvement in overall wound area. Still using clindamycin and silver alginate Right dorsal foot about the same as the left using clindamycin and silver alginate The erythema in the right leg has resolved. He is DVT rule out was negative Left heel pressure area required debridement although the wound is smaller  and the surface is health 10/26/2018 The patient came back in for his nurse check today predominantly because of the drainage coming out of the left lateral leg with [name] recent reopening of his original wound on the left lateral calf. He comes in today with [name] large amount of surrounding erythema around the wound extending from the calf into the ankle and even in the area on the dorsal foot. He is not systemically unwell. He is not febrile. Nevertheless this looks like cellulitis. We have been using silver alginate to the area. I changed him to [name] regular visit and I am going to prescribe him doxycycline. The rationale here is [name] long list of medication intolerances and [name] history of MRSA. I did not see anything that I thought would provide [name] valuable culture 10/30/2018 Follow-up from his appointment 4 days ago with really an extensive area of cellulitis in the left calf left lateral ankle and left dorsal foot. I put him on doxycycline. He has [name] long list of medication allergies which are true allergy reactions. 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 In any case he is [name] lot better today. The erythema especially in the anterior and lateral left calf is better. He still has left ankle erythema. He also is  complaining about increasing edema in the right leg we have only been using Kerlix Coban and he has been doing the wraps at home. Finally he has [name] spotty rash on the medial part of his upper left calf which looks like folliculitis or perhaps wrap occlusion type injury. Small superficial macules not pustules 11/06/18 patient arrives today with again [name] considerable degree of erythema around the wound on the left lateral calf extending into the dorsal ankle and dorsal foot. This is [name] lot worse than when I saw this last week. He is on doxycycline really with not [name] lot of improvement. He has not been systemically unwell Wounds on the; left heel actually looks improved. Original area on the left foot and proximity to the first and second toes looks about the same. He has superficial areas on the dorsal foot, anterior calf and then the reopening of his original wound on the left lateral calf which looks about the same The only area he has on the right is the dorsal webspace first and second which is smaller. He has [name] large area of dry erythematous skin on the left buttock small open area here. 11/13/2018; the patient arrives in much better condition. The erythema around the wound on the left lateral calf is [name] lot better. Not sure whether this was the clindamycin or the TCA and ketoconazole or just in the improvement in edema control [stasis dermatitis]. In any case this is [name] lot better. The area on the left heel is very small and just about resolved using silver collagen we have been using silver alginate to the areas on his dorsal feet 11/20/2018; his wounds include the left lateral calf, left heel, dorsal aspects of both feet just proximal to the first second webspace. He is stable to slightly improved. I did not think any changes to his dressings were going to be necessary 11/27/2018 he has [name] reopening on the left buttock which is surrounded by what looks like tinea or perhaps some other form of dermatitis.  The area on the left dorsal foot has some erythema around it I have marked this area but I am not sure whether  this is cellulitis or not. Left heel is not closed. Left calf the reopening is really slightly longer and probably worse 1/13; in general things look better and smaller except for the left dorsal foot. Area on the left heel is just about closed, left buttock looks better only [name] small wound remains in the skin looks better [using Lotrisone] 1/20; the area on the left heel only has [name] few remaining open areas here. Left lateral calf about the same in terms of size, left dorsal foot slightly larger right lateral foot still not closed. The area on the left buttock has no open wound and the surrounding skin looks [name] lot better 1/27; the area on the left heel is closed. Left lateral calf better but still requiring extensive debridements. The area on his left buttock is closed. He still has the open areas on the left dorsal foot which is slightly smaller in the right foot which is slightly expanded. We have been using Iodoflex on these areas as well 2/3; left heel is closed. Left lateral calf still requiring debridement using Iodoflex there is no open area on his left buttock however he has dry scaly skin over [name] large area of this. Not really responding well to the Lotrisone. Finally the areas on his dorsal feet at the level of the first second webspace are slightly smaller on the right and about the same on the left. Both of these vigorously debrided with Anasept and gauze 2/10; left heel remains closed he has dry erythematous skin over the left buttock but there is no open wound here. Left lateral leg has come in and with. Still requiring debridement we have been using Iodoflex here. Finally the area on the left dorsal foot and right dorsal foot are really about the same extremely dry callused fissured areas. He does not yet have [name] dermatology appointment 2/17; left heel remains closed. He has [name] new  open area on the left buttock. The area on the left lateral calf is bigger longer and still covered in necrotic debris. No major change in his foot areas bilaterally. I am awaiting for [name] dermatologist to look on this. We have been using ketoconazole I do not know that this is been doing any good at all. 2/24; left heel remains closed. The left buttock wound that was new reopening last week looks better. The left lateral calf appears better also although still requires debridement. The major area on his foot is the left first second also requiring debridement. We have been putting Prisma on all wounds. I do not believe that the ketoconazole has done too much good for his feet. He will use Lotrisone I am going to give him [name] 2-week course of terbinafine. We still do not have [name] dermatology appointment 3/2 left heel remains closed however there is skin over bone in this area I pointed this out to him today. The left buttock wound is epithelialized but still does not look completely stable. The area on the left leg required debridement were using silver collagen here. With regards to his feet we changed to Lotrisone last week and silver alginate. 3/9; left heel remains closed. Left buttock remains closed. The area on the right foot is essentially closed. The left foot remains unchanged. Slightly smaller on the left lateral calf. Using silver collagen to both of these areas 3/16-Left heel remains closed. Area on right foot is closed. Left lateral calf above the lateral malleolus open wound requiring debridement with easy bleeding. Left dorsal  wound proximal to first toe also debrided. Left ischial area open new. Patient has been using Prisma with wrapping every 3 days. Dermatology appointment is apparently tomorrow.Patient has completed his terbinafine 2-week course with some apparent improvement according to him, there is still flaking and dry skin in his foot on the left 3/23; area on the right foot is  reopened. The area on the left anterior foot is about the same still [name] very necrotic adherent surface. He still has the area on the left leg and reopening is on the left buttock. He apparently saw dermatology although I do not have [name] note. According to the patient who is usually fairly well informed they did not have any good ideas. Put him on oral terbinafine which she is been on before. 3/30; using silver collagen to all wounds. Apparently his dermatologist put him on doxycycline and rifampin presumably some culture grew staph. I do not have this result. He remains on terbinafine although I have used terbinafine on him before 4/6; patient has had [name] fairly substantial reopening on the right foot between the first and second toes. He is finished his terbinafine and I believe is on doxycycline and rifampin still as prescribed by dermatology. We have been using silver collagen to all his wounds although the patient reports that he thinks silver alginate does better on the wounds on his buttock. 4/13; the area on his left lateral calf about the same size but it did not require debridement. Left dorsal foot just proximal to the webspace between the first and second toes is about the same. Still nonviable surface. I note some superficial bronze discoloration of the dorsal part of his foot Right dorsal foot just proximal to the first and second toes also looks about the same. I still think there may be the same discoloration I noted above on the left Left buttock wound looks about the same 4/20; left lateral calf appears to be gradually contracting using silver collagen. 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 The left dorsal foot wound is debrided of tightly adherent necrotic debris and really cleans up quite nicely. The right area is worse with expansion. I did not debride this it is now over the base of the second toe The area on his left  buttock is smaller no debridement is required using silver collagen 5/4; left calf continues to make good progress. He arrives with erythema around the wounds on his dorsal foot which even extends to the plantar aspect. Very concerning for coexistent infection. He is finished the erythromycin I gave him for possible erythrasma this does not seem to have helped. The area on the left foot is about the same base of the dorsal toes Is area on the buttock looks improved on the left 5/11; left calf and left buttock continued to make good progress. Left foot is about the same to slightly improved. Major problem is on the right foot. He has not had an x-ray. Deep tissue culture I did last week showed both Enterobacter and E. coli. I did not change the doxycycline I put him on empirically although neither 1 of these were plated to doxycycline. He arrives today with the erythema looking worse on both the dorsal and plantar foot. Macerated skin on the bottom of the foot. he has not been systemically unwell 5/18-Patient returns at 1 week, left calf wound appears to be making some progress, left buttock wound appears slightly worse than last time, left  foot wound looks slightly better, right foot redness is marginally better. X-ray of both feet show no air or evidence of osteomyelitis. Patient is finished his Omnicef and terbinafine. He continues to have macerated skin on the bottom of the left foot as well as right 5/26; left calf wound is better, left buttock wound appears to have multiple small superficial open areas with surrounding macerated skin. X-rays that I did last time showed no evidence of osteomyelitis in either foot. He is finished cefdinir and doxycycline. I do not think that he was on terbinafine. He continues to have [name] large superficial open area on the right foot anterior dorsal and slightly between the first and second toes. I did send him to dermatology 2 months ago or so wondering about  whether they would do [name] fungal scraping. I do not believe they did but did do [name] culture. We have been using silver alginate to the toe areas, he has been using antifungals at home topically either ketoconazole or Lotrisone. We are using silver collagen on the left foot, silver alginate on the right, silver collagen on the left lateral leg and silver alginate on the left buttock 6/1; left buttock area is healed. We have the left dorsal foot, left lateral leg and right dorsal foot. We are using silver alginate to the areas on both feet and silver collagen to the area on his left lateral calf 6/8; the left buttock apparently reopened late last week. He is not really sure how this happened. He is tolerating the terbinafine. Using silver alginate to all wounds 6/15; left buttock wound is larger than last week but still superficial. Came in the clinic today with [name] report of purulence from the left lateral leg I did not identify any infection Both areas on his dorsal feet appear to be better. He is tolerating the terbinafine. Using silver alginate to all wounds 6/22; left buttock is about the same this week, left calf quite [name] bit better. His left foot is about the same however he comes in with erythema and warmth in the right forefoot once again. Culture that I gave him in the beginning of May showed Enterobacter and E. coli. I gave him doxycycline and things seem to improve although neither 1 of these organisms was specifically plated. 6/29; left buttock is larger and dry this week. Left lateral calf looks to me to be improved. Left dorsal foot also somewhat improved right foot completely unchanged. The erythema on the right foot is still present. He is completing the Ceftin dinner that I gave him empirically [see discussion above.) 7/6 - All wounds look to be stable and perhaps improved, the left buttock wound is slightly smaller, per patient bleeds easily, completed ceftin, the right foot redness is  less, he is on terbinafine 7/13; left buttock wound about the same perhaps slightly narrower. Area on the left lateral leg continues to narrow. Left dorsal foot slightly smaller right foot about the same. We are using silver alginate on the right foot and Hydrofera Blue to the areas on the left. Unna boot on the left 2 layer compression on the right 7/20; left buttock wound absolutely the same. Area on lateral leg continues to get better. Left dorsal foot require debridement as did the right no major change in the 7/27; left buttock wound the same size necrotic debris over the surface. The area on the lateral leg is closed once again. His left foot looks better right foot about the same although there  is some involvement now of the posterior first second toe area. He is still on terbinafine which I have given him for [name] month, not certain [name] centimeter major change 06/25/19-All wounds appear to be slightly improved according to report, left buttock wound looks clean, both foot wounds have minimal to no debris the right dorsal foot has minimal slough. We are using Hydrofera Blue to the left and silver alginate to the right foot and ischial wound. 8/10-Wounds all appear to be around the same, the right forefoot distal part has some redness which was not there before, however the wound looks clean and small. Ischial wound looks about the same with no changes 8/17; his wound on the left lateral calf which was his original chronic venous insufficiency wound remains closed. Since I last saw him the areas on the left dorsal foot right dorsal foot generally appear better but require debridement. The area on his left initial tuberosity appears somewhat larger to me perhaps hyper granulated and bleeds very easily. We have been using Hydrofera Blue to the left dorsal foot and silver alginate to everything else 8/24; left lateral calf remains closed. The areas on his dorsal feet on the webspace of the first and  second toes bilaterally both look better. The area on the left buttock which is the pressure ulcer stage II slightly smaller. I change the dressing to Hydrofera Blue to all areas 8/31; left lateral calf remains closed. The area on his dorsal feet bilaterally look better. Using Hydrofera Blue. Still requiring debridement on the left foot. No change in the left buttock pressure ulcers however 9/14; left lateral calf remains closed. Dorsal feet look quite [name] bit better than 2 weeks ago. Flaking dry skin also [name] lot better with the ammonium lactate I gave him 2 weeks ago. The area on the left buttock is improved. He states that his Roho cushion developed [name] leak and he is getting [name] new one, in the interim he is offloading this vigorously 9/21; left calf remains closed. Left heel which was [name] possible DTI looks better this week. He had macerated tissue around the left dorsal foot right foot looks satisfactory and improved left buttock wound. I changed his dressings to his feet to silver alginate bilaterally. Continuing Hydrofera Blue on the left buttock. 9/28 left calf remains closed. Left heel did not develop anything [possible DTI] dry flaking skin on the left dorsal foot. Right foot looks satisfactory. Improved left buttock wound. We are using silver alginate on his feet Hydrofera Blue on the buttock. I have asked him to go back to the Lotrisone on his feet including the wounds and surrounding areas 10/5; left calf remains closed. The areas on the left and right feet about the same. [name] lot of this is epithelialized however debris over the remaining open areas. He is using Lotrisone and silver alginate. The area on the left buttock using Hydrofera Blue 10/26. Patient has been out for 3 weeks secondary to Covid concerns. He tested negative but I think his wife tested positive. He comes in today with the left foot substantially worse, right foot about the same. Even more concerning he states that the area on  his left buttock closed over but then reopened and is considerably deeper in one aspect than it was before [stage III wound] 11/2; left foot really about the same as last week. Quarter sized wound on the dorsal foot just proximal to the first second toes. Surrounding erythema with areas of denuded epithelium. This  is not really much different looking. Did not look like cellulitis this time however. Right foot area about the same.. We have been using silver alginate alginate on his toes Left buttock still substantial irritated skin around the wound which I think looks somewhat better. We have been using Hydrofera Blue here. 11/9; left foot larger than last week and [name] very necrotic surface. Right foot I think is about the same perhaps slightly smaller. Debris around the circumference also addressed. Unfortunately on the left buttock there is been [name] decline. Satellite lesions below the major wound distally and now [name] an additional one posteriorly we have been using Hydrofera Blue but I think this is [name] pressure issue 11/16; left foot ulcer dorsally again [name] very adherent necrotic surface. Right foot is about the same. Not much change in the pressure ulcer on his left buttock. 11/30; left foot ulcer dorsally basically the same as when I saw him 2 weeks ago. Very adherent fibrinous debris on the wound surface. Patient reports [name] lot of drainage as well. 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 We have been using Iodoflex, patient changed back to alginate because of the drainage. Area on his right dorsal foot really looks benign with [name] healthier surface certainly [name] lot better than on the left. Left buttock wounds all improved using Hydrofera Blue 12/7; left dorsal foot again no improvement. Tightly adherent debris. PCR culture I did last week only showed likely skin contaminant. I have gone ahead and done [name] punch biopsy of this which is about the last thing in  terms of investigations I can think to do. He has known venous insufficiency and venous hypertension and this could be the issue here. The area on the right foot is about the same left buttock slightly worse according to our intake nurse secondary to [facility] Blue sticking to the wound 12/14; biopsy of the left foot that I did last time showed changes that could be related to wound healing/chronic stasis dermatitis phenomenon no neoplasm. We have been using silver alginate to both feet. I change the one on the left today to Sorbact and silver alginate to his other 2 wounds 12/28; the patient arrives with the following problems; Major issue is the dorsal left foot which continues to be [name] larger deeper wound area. Still with [name] completely nonviable surface Paradoxically the area mirror image on the right on the right dorsal foot appears to be getting better. He had some loss of dry denuded skin from the lower part of his original wound on the left lateral calf. Some of this area looked [name] little vulnerable and for this reason we put him in wrap that on this side this week The area on his left buttock is larger. He still has the erythematous circular area which I think is [name] combination of pressure, sweat. This does not look like cellulitis or fungal dermatitis 11/26/2019; -Dorsal left foot large open wound with depth. Still debris over the surface. Using Sorbact The area on the dorsal right foot paradoxically has closed over He has [name] reopening on the left ankle laterally at the base of his original wound that extended up into the calf. This appears clean. The left buttock wound is smaller but with very adherent necrotic debris over the surface. We have been using silver alginate here as well The patient had arterial studies done in 2017. He had biphasic waveforms at the dorsalis pedis and posterior tibial bilaterally. ABI  in the left was 1.17. Digit waveforms were dampened. He has slight spasticity in  the great toes I do not think [name] TBI would be possible 1/11; the patient comes in today with [name] sizable reopening between the first and second toes on the right. This is not exactly in the same location where we have been treating wounds previously. According to our intake nurse this was actually fairly deep but 0.6 cm. The area on the left dorsal foot looks about the same the surface is somewhat cleaner using Sorbact, his MRI is in 2 days. We have not managed yet to get arterial studies. The new reopening on the left lateral calf looks somewhat better using alginate. The left buttock wound is about the same using alginate 1/18; the patient had his ARTERIAL studies which were quite normal. ABI in the right at 1.13 with triphasic/biphasic waveforms on the left ABI 1.06 again with triphasic/biphasic waveforms. It would not have been possible to have done [name] toe brachial index because of spasticity. We have been using Sorbac to the left foot alginate to the rest of his wounds on the right foot left lateral calf and left buttock 1/25; arrives in clinic with erythema and swelling of the left forefoot worse over the first MTP area. This extends laterally dorsally and but also posteriorly. Still has an area on the left lateral part of the lower part of his calf wound it is eschared and clearly not closed. Area on the left buttock still with surrounding irritation and erythema. Right foot surface wound dorsally. The area between the right and first and second toes appears better. 2/1; The left foot wound is about the same. Erythema slightly better I gave him [name] week of doxycycline empirically Right foot wound is more extensive extending between the toes to the plantar surface Left lateral calf really no open surface on the inferior part of his original wound however the entire area still looks vulnerable Absolutely no improvement in the left buttock wound required debridement. 2/8; the left foot is about the  same. Erythema is slightly improved I gave him clindamycin last week. Right foot looks better he is using Lotrimin and silver alginate He has [name] breakdown in the left lateral calf. Denuded epithelium which I have removed Left buttock about the same were using Hydrofera Blue 2/15; left foot is about the same there is less surrounding erythema. Surface still has tightly adherent debris which I have debriding however not making any progress Right foot has [name] substantial wound on the medial right second toe between the first and second webspace. Still an open area on the left lateral calf distal area. Buttock wound is about the same 2/22; left foot is about the same less surrounding erythema. Surface has adherent debris. Polymen Ag Right foot area significant wound between the first and second toes. We have been using silver alginate here Left lateral leg polymen Ag at the base of his original venous insufficiency wound Left buttock some improvement here 3/1; Right foot is deteriorating in the first second toe webspace. Larger and more substantial. We have been using silver alginate. Left dorsal foot about the same markedly adherent surface debris using PolyMem Ag Left lateral calf surface debris using PolyMem AG Left buttock is improved again using PolyMem Ag. He is completing his terbinafine. The erythema in the foot seems better. He has been on this for 2 weeks 3/8; no improvement in any wound area in fact he has [name] small open area on  the dorsal midfoot which is new this week. He has not gotten his foot x-rays yet 3/15; his x-rays were both negative for osteomyelitis of both feet. No major change in any of his wounds on the extremities however his buttock wounds are better. We have been using polymen on the buttocks, left lower leg. Iodoflex on the left foot and silver alginate on the right 3/22; arrives in clinic today with the 2 major issues are the improvement in the left dorsal foot wound which  for once actually looks healthy with [name] nice healthy wound surface without debridement. Using Iodoflex here. Unfortunately on the left lateral calf which is in the distal part of his original wound he came to the clinic here for there was purulent drainage noted some increased breakdown scattered around the original area and [name] small area proximally. We we are using polymen here will change to silver alginate today. His buttock wound on the left is better and I think the area on the right first second toe webspace is also improved 3/29; left dorsal foot looks better. Using Iodoflex. Left ankle culture from deterioration last time grew E. coli, Enterobacter and Enterococcus. I will give him [name] course of cefdinir although that will not cover Enterococcus. The area on the right foot in the webspace of the first and second toe lateral first toe looks better. The area on his buttock is about healed Vascular appointment is on April 21. This is to look at his venous system vis--vis continued breakdown of the wounds on the left including the left lateral leg and left dorsal foot he. He has had previous ablations on this side 4/5; the area between the right first and second toes lateral aspect of the first toe looks better. Dorsal aspect of the left first toe on the left foot also improved. Unfortunately the left lateral lower leg is larger and there is [name] second satellite wound superiorly. The usual superficial abrasions on the left buttock overall better but certainly not closed 4/12; the area between the right first and second toes is improved. Dorsal aspect of the left foot also slightly smaller with [name] vibrant healthy looking surface. No real change in the left lateral leg and the left buttock wound is healed He has an unaffordable co-pay for Apligraf. Appointment with vein and vascular with regards to the left leg venous part of the circulation is on 4/21 4/19; we continue to see improvement in all wound  areas. Although this is minor. He has his vascular appointment on 4/21. The area on the left buttock has not reopened although right in the center of this area the skin looks somewhat threatened 4/26; the left buttock is unfortunately reopened. In general his left dorsal foot has [name] healthy surface and looks somewhat smaller although it was not measured as such. The area between his first and second toe webspace on the right as [name] small wound against the first toe. The patient saw vascular surgery. The real question I was asking was about the small saphenous vein on the left. He has previously ablated left greater saphenous vein. Nothing further was commented on on the left. Right greater saphenous vein without reflux at the saphenofemoral junction or proximal thigh there was no indication for ablation of the right greater saphenous vein duplex was negative for DVT bilaterally. They did not think there was anything from [name] point of view that could be offered. They ABIs within normal limits 5/3; only small open area on  the left buttock. The area on the left lateral leg which was his original venous reflux is now 2 wounds both which look clean. We are using Iodoflex on the left dorsal foot which looks healthy and smaller. He is down to [name] very tiny area between the right first and second toes, using silver alginate 5/10; all of his wounds appear better. We have much better edema control in 4 layer compression on the left. This may be the factor that is allowing the left foot and left lateral calf to heal. He has external compression garments at home 04/14/20-All of his wounds are progressing well, the left forefoot is practically closed, left ischium appears to be about the same, right toe webspace is also smaller. The left lateral leg is about the same, continue using Hydrofera Blue to this, silver alginate to the ischium, Iodoflex to the toe space on the right 6/7; most of his wounds  outside of the left buttock are doing well. The area on the left lateral calf and left dorsal foot are smaller. The area on the right foot in between the first and second toe webspace is barely visible although he still says there is some drainage here is the only reason I did not heal this out. Unfortunately the area on the left buttock almost looks like he has [name] skin tear from tape. He has open wound and then [name] large flap of skin that we are trying to get adherence over an area just next to the remaining wound 6/21; 2 week follow-up. I believe is been here for nurse visits. Miraculously the area between his first and second toes on the left dorsal foot is closed over. Still open on the right first second web space. The left lateral calf has 2 open areas. Distally this is more superficial. The proximal area had [name] little more depth and required debridement of adherent necrotic material. His buttock wound is actually larger we have been using silver alginate here 6/28; the patient's area on the left foot remains closed. Still open wet area between the first and second toes on the right and also extending into the plantar aspect. We have been using silver alginate in this location. He has 2 areas on the left lower leg part of his original long wounds which I think are better. We have been using Hydrofera Blue here. Hydrofera Blue to the left buttock which is stable 7/12; left foot remains closed. Left ankle is closed. May be [name] small area between his right first and second toes the only truly open area is on the left buttock. We have been using Hydrofera Blue here 7/19; patient arrives with marked deterioration especially in the left foot and ankle. We did not put him in [name] compression wrap on the left last week in fact he wore his juxta lite stockings on either side although he does not have an underlying stocking. He has [name] reopening on the left dorsal foot, left lateral ankle and [name] new area on the right  dorsal ankle. More worrisome is the degree of erythema on the left foot extending on the lateral foot into the lateral lower leg on the left 7/26; the patient had erythema and drainage from the lateral left ankle last week. Culture of this grew MRSA resistant to doxycycline and clindamycin which are the 2 antibiotics we usually use with this patient who has multiple antibiotic allergies including linezolid, trimethoprim sulfamethoxazole. I had give him an empiric doxycycline and he comes in  the area certainly looks somewhat better although it is blotchy in his lower leg. He has not been systemically unwell. He has had areas on the left dorsal foot which is [name] reopening, chronic wounds on the left lateral ankle. Both of these I think are secondary to chronic venous insufficiency. The area between his first and second toes is closed as far as I can tell. He had [name] new wrap injury on the right dorsal ankle last week. Finally he has an area on the left buttock. We have been using silver alginate to everything except the left buttock we are using Hydrofera Blue 06/30/20-Patient returns at 1 week, has been given [name] sample dose pack of NUZYRA which is [name] tetracycline derivative [omadacycline], patient has completed those, we have been using silver alginate to almost all the wounds except the left ischium where we are using Hydrofera Blue all of them look better 8/16; since I last saw the patient he has been doing well. The area on the left buttock, left lateral ankle and left foot are all closed today. He has completed the [place] I gave him last time and tolerated this well. He still has open areas on the right dorsal ankle and in the right first second toe area which we are using silver alginate. 8/23; we put him in his bilateral external compression stockings last week as he did not have anything open on either leg except for concerning area between the right first and second toe. He comes in today with an area on  the left dorsal foot slightly more proximal than the original wound, the left lateral foot but this is actually [name] continuation of the area he had on the left lateral ankle from last time. As well he is opened up on the left buttock again. 8/30; comes in today with things looking [name] lot better. The area on the left lower ankle has closed down as has the left foot but with eschar in both areas. The area on the dorsal right ankle is also epithelialized. Very little remaining of the left buttock wound. We have been using silver alginate on all wound areas 9/13; the area in the first second toe webspace on the right has fully epithelialized. He still has some vulnerable epithelium on the right and the ankle and the dorsal foot. He notes weeping. He is using his juxta lite stocking. On the left again the left dorsal foot is closed left lateral ankle is closed. We went to the juxta lite stocking here as well. Still vulnerable in the left buttock although only 2 small open areas remain here 9/27; 2-week follow-up. We did not look at his left leg but the patient says everything is closed. He is [name] bit disturbed by the amount of edema in his left foot he is using juxta lite stockings but asking about over the toes stockings which would be 30/40, will talk to him next time. According to him there is no open wound on either the left foot or the left ankle/calf He has an open area on the dorsal right calf which I initially point [name] wrap injury. He has superficial remaining wound on the left ischial tuberosity been using silver alginate although he says this sticks to the wound 10/5; we gave him 2-week follow-up but he called yesterday expressing some concerns about his right foot right ankle and the left buttock. He came in early. There is still no open areas on the left leg and that still in  his juxta lite stocking 10/11; he only has 1 small area on the left buttock that remains measuring millimeters 1 mm. Still has  the same irritated skin in this area. We recommended zinc oxide when this eventually closes and pressure relief is meticulously is he can do this. He still has an area on the dorsal part of his right first through third toes which is [name] bit irritated and still open and on the dorsal ankle near the crease of the ankle. We have been using silver alginate and using his own stocking. He has nothing open on the left leg or foot 10/25; 2-week follow-up. Not nearly as good on the left buttock as I was hoping. For open areas with 5 looking threatened small. He has the erythematous irritated chronic skin in this area. 1 area on the right dorsal ankle. He reports this area bleeds easily Right dorsal foot just proximal to the base of his toes We have been using silver alginate. 11/8; 2-week follow-up. Left buttock is about the same although I do not think the wounds are in the same location we have been using silver alginate. I have asked him to use zinc oxide on the skin around the wounds. He still has [name] small area on the right dorsal ankle he reports this bleeds easily Right dorsal foot just proximal to the base of the toes does not have anything open although the skin is very dry and scaly He has [name] new opening on the nailbed of the left great toe. Nothing on the left ankle 11/29; 3-week follow-up. Left buttock has 2 open areas. And washing of these wounds today started bleeding easily. Suggesting very friable tissue. We have been using silver alginate. Right dorsal ankle which I thought was initially [name] wrap injury we have been using silver alginate. Nothing open between the toes that I can see. He states the area on the left dorsal toe nailbed healed after the last visit in 2 or 3 days 12/13; 3-week follow-up. His left buttock now has 3 open areas but the original 2 areas are smaller using polymen here. Surrounding skin looks better. The right dorsal ankle is closed. He has [name] small opening on the right  dorsal foot at the level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 His left anterior leg wound has less surface slough as reported by her intake nurse. Small wound at the base of the left second  toe 12/02/16; patient's right leg is better and there is no open wound here. His left anterior lateral leg wound continues to have [name] healthy-looking surface. Small wound at the base of the left second toe however there is erythema in the left forefoot which is worrisome 12/16/16; is no open wounds on his right leg. We took measurements for stockings. His left anterior lateral leg wound continues to have [name] healthy-looking surface. I'm not sure where we were with the Apligraf run through his insurance. We have been using Iodoflex. He has [name] thick eschar on the left first second toe interface, I suspect this may be fungal however there is no visible open 12/23/16; no open wound on his right leg. He has 2 small areas left of the linear wound that was remaining last week. We have been using Prisma, I thought I have disclosed this week, we can only look forward to next week 01/03/17; the patient had concerning areas of erythema last week, already on doxycycline for UTI through his primary doctor. The erythema is absolutely no better there is warmth and swelling both medially from the left lateral leg wound and also the dorsal left foot. 01/06/17- Patient is here for follow-up evaluation of his left lateral leg ulcer and bilateral feet ulcers. He is on oral antibiotic therapy, tolerating that. Nursing staff and the patient states that the erythema is improved from Monday. 01/13/17; the predominant left lateral leg wound continues to be problematic. I had put Apligraf on him earlier this month once. However he subsequently developed what appeared to be an intense cellulitis  around the left lateral leg wound. I gave him Dalvance I think on 2/12 perhaps 2/13 he continues on cefdinir. The erythema is still present but the warmth and swelling is improved. I am hopeful that the cellulitis part of this control. I wouldn't be surprised if there is an element of venous inflammation as well. 01/17/17. The erythema is present but better in the left leg. His left lateral leg wound still does not have [name] viable surface buttons certain parts of this long thin wound it appears like there has been improvement in dimensions. 01/20/17; the erythema still present but much better in the left leg. I'm thinking this is his usual degree of chronic venous inflammation. The wound on the left leg looks somewhat better. Is less surface slough 01/27/17; erythema is back to the chronic venous inflammation. The wound on the left leg is somewhat better. I am back to the point where I like to try an Apligraf once again 02/10/17; slight improvement in wound dimensions. Apligraf #2. He is completing his doxycycline 02/14/17; patient arrives today having completed doxycycline last Thursday. This was supposed to be [name] nurse visit however once again he hasn't tense erythema from the medial part of his wound extending over the lower leg. Also erythema in his foot this is roughly in the same distribution as last time. He has baseline chronic venous inflammation however this is [name] lot worse than the baseline I have learned to accept the on him is baseline inflammation 02/24/17- patient is here for follow-up evaluation. He is tolerating compression therapy. His voicing no complaints or concerns he is here anticipating an Apligraf 03/03/17; he arrives today with an adherent necrotic surface. I don't think this is surface is going to be amenable for Apligraf's. The erythema around his wound and on the left dorsal foot has resolved he is off antibiotics 03/10/17; better-looking surface today. I don't think he can  tolerate  Apligraf's. He tells me he had [name] wound VAC after [name] skin graft years ago to this area and they had difficulty with [name] seal. The erythema continues to be stable around this some degree of chronic venous inflammation but he also has recurrent cellulitis. We have been using Iodoflex 03/17/17; continued improvement in the surface and may be small changes in dimensions. Using Iodoflex which seems the only thing that will control his surface 03/24/17- He is here for follow up evaluation of his LLE lateral ulceration and ulcer to right dorsal foot/toe space. He is voicing no complaints or concerns, He is tolerating compression wrap. 03/31/17 arrives today with [name] much healthier looking wound on the left lower extremity. We have been using Iodoflex for [name] prolonged period of time which has for the first time prepared and adequate looking wound bed although we have not had much in the way of wound dimension improvement. He also has [name] small wound between the first and second toe on the right 04/07/17; arrives today with [name] healthy-looking wound bed and at least the top 50% of this wound appears to be now her. No debridement was required I have changed him to [facility] last week after prolonged Iodoflex. He did not do well with Apligraf's. We've had [name] re-opening between the first and second toe on the right 04/14/17; arrives today with [name] healthier looking wound bed contractions and the top 50% of this wound and some on the lesser 50%. Wound bed appears healthy. The area between the first and second toe on the right still remains problematic 04/21/17; continued very gradual improvement. Using [facility] 04/28/17; continued very gradual improvement in the left lateral leg venous insufficiency wound. His periwound erythema is very mild. We have been using Hydrofera Blue. Wound is making progress especially in the superior 50% 05/05/17; he continues to have very gradual improvement in the left lateral venous  insufficiency wound. Both in terms with an length rings are improving. I debrided this every 2 weeks with #5 curet and we have been using Hydrofera Blue and again making good progress With regards to the wounds between his right first and second toe which I thought might of been tinea pedis he is not making as much progress very dry scaly skin over the area. Also the area at the base of the left first and second toe in [name] similar condition 05/12/17; continued gradual improvement in the refractory left lateral venous insufficiency wound on the left. Dimension smaller. Surface still requiring debridement using Hydrofera Blue 05/19/17; continued gradual improvement in the refractory left lateral venous ulceration. Careful inspection of the wound bed underlying rumination suggested some degree of epithelialization over the surface no debridement indicated. Continue Hydrofera Blue difficult areas between his toes first and third on the left than first and second on the right. I'm going to change to silver alginate from silver collagen. Continue ketoconazole as I suspect underlying tinea pedis 05/26/17; left lateral leg venous insufficiency wound. We've been using Hydrofera Blue. I believe that there is expanding epithelialization over the surface of the wound albeit not coming from the wound circumference. This is [name] bit of an odd situation in which the epithelialization seems to be coming from the surface of the wound rather than in the exact circumference. There is still small open areas mostly along the lateral margin of the wound. ooHe has unchanged areas between the left first and second and the right first second toes which I been treating for  tenia pedis 06/02/17; left lateral leg venous insufficiency wound. We have been using Hydrofera Blue. Somewhat smaller from the wound circumference. The surface of the wound remains [name] bit on it almost epithelialized sedation in appearance. I use an open curette today  debridement in the surface of all of this especially the edges ooSmall open wounds remaining on the dorsal right first and second toe interspace and the plantar left first second toe and her face on the left 06/09/17; wound on the left lateral leg continues to be smaller but very gradual and very dry surface using Hydrofera Blue 06/16/17 requires weekly debridements now on the left lateral leg although this continues to contract. I changed to silver collagen last week because of dryness of the wound bed. Using Iodoflex to the areas on his first and second toes/web space bilaterally 06/24/17; patient with history of paraplegia also chronic venous insufficiency with lymphedema. Has [name] very difficult wound on the left lateral leg. This has been gradually reducing in terms of with but comes in with [name] very dry adherent surface. High switch to silver collagen [name] week or so ago with hydrogel to keep the area moist. This is been refractory to multiple dressing attempts. He also has areas in his first and second toes bilaterally in the anterior and posterior web space. I had been using Iodoflex here after [name] prolonged course of silver alginate with ketoconazole was ineffective [question tinea pedis] 07/14/17; patient arrives today with [name] very difficult adherent material over his left lateral lower leg wound. He also has surrounding erythema and poorly controlled edema. He was switched his Santyl last visit which the nurses are applying once during his doctor visit and once on [name] nurse visit. He was also reduced to 2 layer compression I'm not exactly sure of the issue here. 07/21/17; better surface today after 1 week of Iodoflex. Significant cellulitis that we treated last week also better. [Doxycycline] 07/28/17 better surface today with now 2 weeks of Iodoflex. Significant cellulitis treated with doxycycline. 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 He reminds me he has had ablations surgery here 08/04/17; continued improvement with Iodoflex to the left lateral leg wound in terms of the surface of the wound although the dimensions are better. 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 Problematic areas on the plantar aspect of the first second toe web space on the left and the dorsal aspect of the first second toe web space on the right. At one point I felt these were probably related to chronic fungal infections in treated him aggressively for this although we have not made any improvement here. 08/11/17; left lateral leg. Surface continues to improve with the Iodoflex although we are not seeing much improvement in overall wound dimensions. Areas on his plantar left foot and right foot show no improvement. In fact the right foot looks somewhat worse 08/18/17; left lateral leg. We changed to [facility] Blue last week after [name] prolonged course of Iodoflex which helps get the surface better. It appears that the wound with is improved. Continue with difficult areas on the left dorsal first second and plantar first second on the right 09/01/17; patient arrives in clinic today having had [name] temperature of 103 yesterday. He was seen in the ER and [facility]. The patient was concerned he could have cellulitis again in the right leg however they diagnosed him  with [name] UTI and he is now on Keflex. He has [name] history of cellulitis which is been recurrent and difficult but this is been in the left leg, in the past 5 use doxycycline. He does in and out catheterizations at home which are risk factors for UTI 09/08/17; patient will be completing his Keflex this weekend. The erythema on the left leg is considerably better. He has [name] new wound today on the medial part of the right leg small superficial almost looks like [name] skin tear. He has worsening of the area on the right dorsal first and  second toe. His major area on the left lateral leg is better. Using Hydrofera Blue on all areas 09/15/17; gradual reduction in width on the long wound in the left lateral leg. No debridement required. He also has wounds on the plantar aspect of his left first second toe web space and on the dorsal aspect of the right first second toe web space. 09/22/17; there continues to be very gradual improvements in the dimensions of the left lateral leg wound. He hasn't round erythematous spot with might be pressure on his wheelchair. There is no evidence obviously of infection no purulence no warmth ooHe has [name] dry scaled area on the plantar aspect of the left first second toe ooImproved area on the dorsal right first second toe. 09/29/17; left lateral leg wound continues to improve in dimensions mostly with an is still [name] fairly long but increasingly narrow wound. ooHe has [name] dry scaled area on the plantar aspect of his left first second toe web space ooIncreasingly concerning area on the dorsal right first second toe. In fact I am concerned today about possible cellulitis around this wound. The areas extending up his second toe and although there is deformities here almost appears to abut on the nailbed. 10/06/17; left lateral leg wound continues to make very gradual progress. Tissue culture I did from the right first second toe dorsal foot last time grew MRSA and enterococcus which was vancomycin sensitive. This was not sensitive to clindamycin or doxycycline. He is allergic to Zyvox and sulfa we have therefore arrange for him to have dalvance infusion tomorrow. He is had this in the past and tolerated it well 10/20/17; left lateral leg wound continues to make decent progress. This is certainly reduced in terms of with there is advancing epithelialization.ooThe cellulitis in the right foot looks better although he still has [name] deep wound in the dorsal aspect of the first second toe web space. Plantar left  first toe web space on the left I think is making some progress 10/27/17; left lateral leg wound continues to make decent progress. Advancing epithelialization.using Hydrofera Blue ooThe right first second toe web space wound is better-looking using silver alginate ooImprovement in the left plantar first second toe web space. Again using silver alginate 11/03/17 left lateral leg wound continues to make decent progress albeit slowly. Using Hydrofera Blue ooThe right per second toe web space continues to be [name] very problematic looking punched out wound. I obtained [name] piece of tissue for deep culture I did extensively treated this for fungus. It is difficult to imagine that this is [name] pressure area as the patient states other than going outside he doesn't really wear shoes at home ooThe left plantar first second toe web space looked fairly senescent. Necrotic edges. This required debridement oochange to Hydrofera Blue to all wound areas 11/10/17; left lateral leg wound continues to contract. Using Hydrofera Blue ooOn the right dorsal  first second toe web space dorsally. Culture I did of this area last week grew MRSA there is not an easy oral option in this patient was multiple antibiotic allergies or intolerances. This was only [name] rare culture isolate I'm therefore going to use Bactroban under silver alginate ooOn the left plantar first second toe web space. Debridement is required here. This is also unchanged 11/17/17; left lateral leg wound continues to contract using Hydrofera Blue this is no longer the major issue. ooThe major concern here is the right first second toe web space. He now has an open area going from dorsally to the plantar aspect. There is now wound on the inner lateral part of the first toe. Not [name] very viable surface on this. There is erythema spreading medially into the forefoot. ooNo major change in the left first second toe plantar wound 11/24/17; left lateral leg wound  continues to contract using Hydrofera Blue. Nice improvement today ooThe right first second toe web space all of this looks [name] lot less angry than last week. I have given him clindamycin and topical Bactroban for MRSA and terbinafine for the possibility of underlining tinea pedis that I could not control with ketoconazole. Looks somewhat better ooThe area on the plantar left first second toe web space is weeping with dried debris around the wound 12/01/17; left lateral leg wound continues to contract he Hydrofera Blue. It is becoming thinner in terms of with nevertheless it is making good improvement. ooThe right first second toe web space looks less angry but still [name] large necrotic-looking wounds starting on the plantar aspect of the right foot extending between the toes and now extensively on the base of the right second toe. I gave him clindamycin and topical Bactroban for MRSA anterior benefiting for the possibility of underlying tinea pedis. Not looking better today ooThe area on the left first/second toe looks better. Debrided of necrotic debris 12/05/17* the patient was worked in urgently today because over the weekend he found blood on his incontinence bad when he woke up. He was found to have an ulcer by his wife who does most of his wound care. He came in today for [place] to look at this. He has not had [name] history of wounds in his buttocks in spite of his paraplegia. 12/08/17; seen in follow-up today at his usual appointment. He was seen earlier this week and found to have [name] new wound on his buttock. We also follow him for wounds on the left lateral leg, left first second toe web space and right first second toe web space 12/15/17; we have been using Hydrofera Blue to the left lateral leg which has improved. The right first second toe web space has also improved. Left first second toe web space plantar aspect looks stable. The left buttock has worsened using Santyl. Apparently the buttock has  drainage 12/22/17; we have been using Hydrofera Blue to the left lateral leg which continues to improve now 2 small wounds separated by normal skin. He tells [place] he had [name] fever up to 100 yesterday he is prone to UTIs but has not noted anything different. He does in and out catheterizations. The area between the first and second toes today does not look good necrotic surface covered with what looks to be purulent drainage and erythema extending into the third toe. I had gotten this to something that I thought look better last time however it is not look good today. He also has [name] necrotic surface over  the buttock wound which is expanded. I thought there might be infection under here so I removed [name] lot of the surface with [name] #5 curet though nothing look like it really needed culturing. He is been using Santyl to this area 12/27/17; his original wound on the left lateral leg continues to improve using Hydrofera Blue. I gave him samples of Baxdella although he was unable to take them out of fear for an allergic reaction ["lump in his throat"].the culture I did of the purulent drainage from his second toe last week showed both enterococcus and [name] set Enterobacter I was also concerned about the erythema on the bottom of his foot although paradoxically although this looks somewhat better today. 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 We've been using silver alginate here. They came up today that he sleeps in [name] recliner, I'm not sure why but I asked him to stop this 01/03/18; his original wound we've been using Hydrofera Blue is now separated into 2 areas. ooUlcer on his left buttock is better he is off the recliner and sleeping in bed ooFinally both wound areas between his first and second toes also looks some better 01/10/18; his original wound on the left lateral leg is now separated into 2 wounds we've been using  Hydrofera Blue ooUlcer on his left buttock has some drainage. There is [name] small probing site going into muscle layer superiorly.using silver alginate -He arrives today with [name] deep tissue injury on the left heel ooThe wound on the dorsal aspect of his first second toe on the left looks [name] lot betterusing silver alginate ketoconazole ooThe area on the first second toe web space on the right also looks [name] lot bette 01/17/18; his original wound on the left lateral leg continues to progress using Hydrofera Blue ooUlcer on his left buttock also is smaller surface healthier except for [name] small probing site going into the muscle layer superiorly. 2.4 cm of tunneling in this area ooDTI on his left heel we have only been offloading. Looks better than last week no threatened open no evidence of infection oothe wound on the dorsal aspect of the first second toe on the left continues to look like it's regressing we have only been using silver alginate and terbinafine orally ooThe area in the first second toe web space on the right also looks to be [name] lot better using silver alginate and terbinafine I think this was prompted by tinea pedis 01/31/18; the patient was hospitalized in [place] last week apparently for [name] complicated UTI. He was discharged on cefepime he does in and out catheterizations. In the hospital he was discovered M I don't mild elevation of AST and ALT and the terbinafine was stopped.predictably the pressure ulcer on s his buttock looks betterusing silver alginate. The area on the left lateral leg also is better using Hydrofera Blue. The area between the first and second toes on the left better. First and second toes on the right still substantial but better. Finally the DTI on the left heel has held together and looks like it's resolving 02/07/18-he is here in follow-up evaluation for multiple ulcerations. He has new injury to the lateral aspect of the last issue [name] pressure ulcer, he states  this is from adhesive removal trauma. He states he has tried multiple adhesive products with no success. All other ulcers appear stable. The left heel DTI is resolving. We will  continue with same treatment plan and follow-up next week. 02/14/18; follow-up for multiple areas. ooHe has [name] new area last week on the lateral aspect of his pressure ulcer more over the posterior trochanter. The original pressure ulcer looks quite stable has healthy granulation. 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 Almost fully epithelialized on the original superior area using Hydrofera Blue ooDTI on the left heel has peeled off this week to reveal [name] small superficial wound under denuded skin and subcutaneous tissue ooBoth areas between the first and second toes look better including nothing open on the left 02/21/18; ooThe patient's wounds on his left ischial tuberosity and posterior left greater trochanter actually looked better. He has [name] large area of irritation around the area which I think is contact dermatitis. I am doubtful that this is fungal ooHis original wound on the left lateral calf continues to improve we have been using Hydrofera Blue ooThere is no open area in the left first second toe web space although there is [name] lot of thick callus ooThe DTI on the left heel required debridement today of necrotic surface eschar and subcutaneous tissue using silver alginate ooFinally the area on the right first second toe webspace continues to contract using silver alginate and ketoconazole 02/28/18 ooLeft ischial tuberosity wounds look better using silver alginate. ooOriginal wound on the left calf only has one small open area left using Hydrofera Blue ooDTI on the left heel required debridement mostly removing skin from around this wound surface. Using silver alginate ooThe areas on the right  first/second toe web space using silver alginate and ketoconazole 03/08/18 on evaluation today patient appears to be doing decently well as best I can tell in regard to his wounds. This is the first time that I have seen him as he generally is followed by Dr. Dellia Nims. With that being said none of his wounds appear to be infected he does have an area where there is some skin covering what appears to be [name] new wound on the left dorsal surface of his great toe. This is right at the nail bed. With that being said I do believe that debrided away some of the excess skin can be of benefit in this regard. Otherwise he has been tolerating the dressing changes without complication. 03/14/18; patient arrives today with the multiplicity of wounds that we are following. He has not been systemically unwell ooOriginal wound on the left lateral calf now only has 2 small open areas we've been using Hydrofera Blue which should continue ooThe deep tissue injury on the left heel requires debridement today. We've been using silver alginate ooThe left first second toe and the right first second toe are both are reminiscence what I think was tinea pedis. Apparently some of the callus Surface between the toes was removed last week when it started draining. ooPurulent drainage coming from the wound on the ischial tuberosity on the left. 03/21/18-He is here in follow-up evaluation for multiple wounds. 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 He tolerated debridement. The only change to last week's recommendations is to discontinue antifungal cream between toes. He will follow-up next week 03/28/18; following up for multiple wounds;Concern this week is streaking redness and swelling in the right foot. He is going to need antibiotics for this. 03/31/18; follow-up for right foot cellulitis. Streaking redness and swelling in the right  foot on 03/28/18. He has  multiple antibiotic intolerances and [name] history of MRSA. I put him on clindamycin 300 mg every 6 and brought him in for [name] quick check. He has an open wound between his first and second toes on the right foot as [name] potential source. 04/04/18; ooRight foot cellulitis is resolving he is completing clindamycin. This is truly good news ooLeft lateral calf wound which is initial wound only has one small open area inferiorly this is close to healing out. He has compression stockings. We will use Hydrofera Blue right down to the epithelialization of this ooNonviable surface on the left heel which was initially pressure with [name] DTI. We've been using Hydrofera Blue. I'm going to switch this back to silver alginate ooLeft first second toe/tinea pedis this looks better using silver alginate ooRight first second toe tinea pedis using silver alginate ooLarge pressure ulcers on theLeft ischial tuberosity. Small wound here Looks better. I am uncertain about the surface over the large wound. Using silver alginate 04/11/18; ooCellulitis in the right foot is resolved ooLeft lateral calf wound which was his original wounds still has 2 tiny open areas remaining this is just about closed ooNonviable surface on the left heel is better but still requires debridement ooLeft first second toe/tinea pedis still open using silver alginate ooRight first second toe wound tinea pedis I asked him to go back to using ketoconazole and silver alginate ooLarge pressure ulcers on the left ischial tuberosity this shear injury here is resolved. Wound is smaller. No evidence of infection using silver alginate 04/18/18; ooPatient arrives with an intense area of cellulitis in the right mid lower calf extending into the right heel area. Bright red and warm. Smaller area on the left anterior leg. He has [name] significant history of MRSA. He will definitely need antibioticsoodoxycycline ooHe now has 2 open areas on the left ischial  tuberosity the original large wound and now [name] satellite area which I think was above his initial satellite areas. Not [name] wonderful surface on this satellite area surrounding erythema which looks like pressure related. ooHis left lateral calf wound again his original wound is just about closed ooLeft heel pressure injury still requiring debridement ooLeft first second toe looks [name] lot better using silver alginate ooRight first second toe also using silver alginate and ketoconazole cream also looks better 04/20/18; the patient was worked in early today out of concerns with his cellulitis on the right leg. I had started him on doxycycline. This was 2 days ago. His wife was concerned about the swelling in the area. Also concerned about the left buttock. He has not been systemically unwell no fever chills. No nausea vomiting or diarrhea 04/25/18; the patient's left buttock wound is continued to deteriorate he is using Hydrofera Blue. He is still completing clindamycin for the cellulitis on the right leg although all of this looks better. 05/02/18 ooLeft buttock wound still with [name] lot of drainage and [name] very tightly adherent fibrinous necrotic surface. He has [name] deeper area superiorly ooThe left lateral calf wound is still closed ooDTI wound on the left heel necrotic surface especially the circumference using Iodoflex ooAreas between his left first second toe and right first second toe both look better. Dorsally and the right first second toe he had [name] necrotic surface although at smaller. In using silver alginate and ketoconazole. I did [name] culture last week which was [name] deep tissue culture of the reminiscence of the open wound on the right first second toe  dorsally. This grew [name] few Acinetobacter and [name] few methicillin-resistant staph aureus. Nevertheless the area actually this week looked better. I didn't feel the need to specifically address this at least in terms of systemic antibiotics. 05/09/18; wounds  are measuring larger more drainage per our intake. We are using Santyl covered with alginate on the large superficial buttock wounds, Iodosorb on the left heel, ketoconazole and silver alginate to the dorsal first and second toes bilaterally. 05/16/18; ooThe area on his left buttock better in some aspects although the area superiorly over the ischial tuberosity required an extensive debridement.using Santyl ooLeft heel appears stable. Using Iodoflex ooThe areas between his first and second toes are not bad however there is spreading erythema up the dorsal aspect of his left foot this looks like cellulitis again. He is insensate the erythema is really very brilliant.o Erysipelas He went to see an allergist days ago because he was itching part of this he had lab work done. This showed [name] white count of 15.1 with 70% neutrophils. Hemoglobin of 11.4 and [name] platelet count of 659,000. Last white count we had in [facility] was [name] 2-1/2 years ago which was 25.9 but he was ill at the time. He was able to show me some lab work that was done by his primary physician the pattern is about the same. 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 But prompted me to go ahead and do x-rays of both feet and the pelvis rule out osteomyelitis. He also had [name] comprehensive metabolic panel this was reasonably normal his albumin was 3.7 liver function tests BUN/creatinine all normal 05/23/18; x-rays of both his feet from last week were negative for underlying pulmonary abnormality. The x-ray of his pelvis however showed mild irregularity in the left ischial which may represent some early osteomyelitis. The wound in the left ischial continues to get deeper clearly now exposed muscle. Each week necrotic surface material over this area. Whereas the rest of the wounds do not look so bad. ooThe left ischial wound we have been using Santyl and calcium alginate ooT the left heel surface necrotic  debris using Iodoflex o ooThe left lateral leg is still healed ooAreas on the left dorsal foot and the right dorsal foot are about the same. There is some inflammation on the left which might represent contact dermatitis, fungal dermatitis I am doubtful cellulitis although this looks better than last week 05/30/18; CT scan done at Hospital did not show any osteomyelitis or abscess. Suggested the possibility of underlying cellulitis although I don't see [name] lot of evidence of this at the bedside ooThe wound itself on the left buttock/upper thigh actually looks somewhat better. No debridement ooLeft heel also looks better no debridement continue Iodoflex ooBoth dorsal first second toe spaces appear better using Lotrisone. Left still required debridement 06/06/18; ooIntake reported some purulent looking drainage from the left gluteal wound. Using Santyl and calcium alginate ooLeft heel looks better although still [name] nonviable surface requiring debridement ooThe left dorsal foot first/second webspace actually expanding and somewhat deeper. I may consider doing [name] shave biopsy of this area ooRight dorsal foot first/second webspace appears stable to improved. Using Lotrisone and silver alginate to both these areas 06/13/18 ooLeft gluteal surface looks better. Now separated in the 2 wounds. No debridement required. Still drainage. We'll continue silver alginate ooLeft heel continues to look better with Iodoflex continue this for at least another week ooOf his dorsal foot wounds the area  on the left still has some depth although it looks better than last week. We've been using Lotrisone and silver alginate 06/20/18 ooLeft gluteal continues to look better healthy tissue ooLeft heel continues to look better healthy granulation wound is smaller. He is using Iodoflex and his long as this continues continue the Iodoflex ooDorsal right foot looks better unfortunately dorsal left foot does not. There is  swelling and erythema of his forefoot. He had minor trauma to this several days ago but doesn't think this was enough to have caused any tissue injury. Foot looks like cellulitis, we have had this problem before 06/27/18 on evaluation today patient appears to be doing [name] little worse in regard to his foot ulcer. 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 Both of which would really be his only options for treating this infection. In the past he has been given and effusion of Orbactiv. This is done very well for him in the past again it's one time dosing IV antibiotic therapy. Subsequently I do believe this is something we're gonna need to see about doing at this point in time. Currently his other wounds seem to be doing somewhat better in my pinion I'm pretty happy in that regard. 07/03/18 on evaluation today patient's wounds actually appear to be doing fairly well. He has been tolerating the dressing changes without complication. All in all he seems to be showing signs of improvement. In regard to the antibiotics he has been dealing with infectious disease since I saw him last week as far as getting this scheduled. In the end [facility]'s [facility] this coming Friday. In the meantime he has been continuing to perform the dressing changes in such as previous. There does not appear to be any evidence of infection worsengin at this time. 07/10/18; ooSince I last saw this man 2 weeks ago things have actually improved. IV antibiotics of resulted in less forefoot erythema although there is still some present. He is not systemically unwell ooLeft buttock wounds o2 now have no depth there is increased epithelialization Using silver alginate ooLeft heel still requires debridement using Iodoflex ooLeft dorsal foot still  with [name] sizable wound about the size of [name] border but healthy granulation ooRight dorsal foot still with [name] slitlike area using silver alginate 07/18/18; the patient's cellulitis in the left foot is improved in fact I think it is on its way to resolving. ooLeft buttock wounds o2 both look better although the larger one has hypertension granulation we've been using silver alginate ooLeft heel has some thick circumferential redundant skin over the wound edge which will need to be removed today we've been using Iodoflex ooLeft dorsal foot is still [name] sizable wound required debridement using silver alginate ooThe right dorsal foot is just about closed only [name] small open area remains here 07/25/18; left foot cellulitis is resolved ooLeft buttock wounds o2 both look better. Hyper-granulation on the major area ooLeft heel as some debris over the surface but otherwise looks [name] healthier wound. Using silver collagen ooRight dorsal foot is just about closed 07/31/18; arrives with our intake nurse worried about purulent drainage from the buttock. We had hyper-granulation here last week ooHis buttock wounds o2 continue to look better ooLeft heel some debris over the surface but measuring smaller. ooRight dorsal foot unfortunately  has openings between the toes ooLeft foot superficial wound looks less aggravated. 08/07/18 ooButtock wounds continue to look better although some of her granulation and the larger medial wound. silver alginate ooLeft heel continues to look [name] lot better.silver collagen ooLeft foot superficial wound looks less stable. Requires debridement. He has [name] new wound superficial area on the foot on the lateral dorsal foot. ooRight foot looks better using silver alginate without Lotrisone 08/14/2018; patient was in the ER last week diagnosed with [name] UTI. He is now on Cefpodoxime and Macrodantin. ooButtock wounds continued to be smaller. Using silver alginate ooLeft heel continues to  look better using silver collagen ooLeft foot superficial wound looks as though it is improving ooRight dorsal foot area is just about healed. 08/21/2018; patient is completed his antibiotics for his UTI. ooHe has 2 open areas on the buttocks. There is still not closed although the surface looks satisfactory. Using silver alginate ooLeft heel continues to improve using silver collagen ooThe bilateral dorsal foot areas which are at the base of his first and second toes/possible tinea pedis are actually stable on the left but worse on the right. The area on the left required debridement of necrotic surface. After debridement I obtained [name] specimen for PCR culture. ooThe right dorsal foot which is been just about healed last week is now reopened 08/28/2018; culture done on the left dorsal foot showed coag negative staph both staph epidermidis and Lugdunensis. I think this is worthwhile initiating systemic treatment. I will use doxycycline given his long list of allergies. The area on the left heel slightly improved but still requiring debridement. ooThe large wound on the buttock is just about closed whereas the smaller one is larger. Using silver alginate in this area 09/04/2018; patient is completing his doxycycline for the left foot although this continues to be [name] very difficult wound area with very adherent necrotic debris. We are using silver alginate to all his wounds right foot left foot and the small wounds on his buttock, silver collagen on the left heel. 09/11/2018; once again this patient has intense erythema and swelling of the left forefoot. Lesser degrees of erythema in the right foot. He has [name] long list of allergies and intolerances. I will reinstitute doxycycline. 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 Using silver alginate ooLeft heel also looks better using silver collagen ooUnfortunately both the areas on his feet look  worse. The area on the left first second webspace is now gone through to the plantar part of his foot. The area on the left foot anteriorly is irritated with erythema and swelling in the forefoot. 09/25/2018 ooHis wound on the left plantar heel looks better. Using silver collagen ooThe area on the left buttock 2 small remnant areas. One is closed one is still open. Using silver alginate ooThe areas between both his first and second toes look worse. This in spite of long-standing antifungal therapy with ketoconazole and silver alginate which should have antifungal activity ooHe has small areas around his original wound on the left calf one is on the bottom of the original scar tissue and one superiorly both of these are small and superficial but again given wound history in this site this is worrisome 10/02/2018 ooLeft plantar heel continues to gradually contract using silver collagen ooLeft buttock wound is unchanged using silver alginate ooThe areas on his dorsal feet between his first and second toes bilaterally look about the same. 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 Small open area here. We have been using silver alginate 10/09/2018; ooLeft plantar heel continues to look healthy and contract. No debridement is required ooLeft buttock slightly smaller with [name] tape injury wound just below which was new this week ooDorsal feet somewhat improved I have been using clindamycin ooLeft lateral looks lower extremity the actual open area looks worse although [name] lot of this is epithelialized. I am going to change to silver collagen today He has [name] lot more swelling in the right leg although this is not pitting not red and not particularly warm there is [name] lot of spasm in the right leg usually indicative of  people with paralysis of some underlying discomfort. We have reviewed his vascular status from 2017 he had [name] left greater saphenous vein ablation. I wonder about referring him back to vascular surgery if the area on the left leg continues to deteriorate. 10/16/2018 in today for follow-up and management of multiple lower extremity ulcers. His left Buttock wound is much lower smaller and almost closed completely. The wound to the left ankle has began to reopen with Epithelialization and some adherent slough. He has multiple new areas to the left foot and leg. The left dorsal foot without much improvement. Wound present between left great webspace and 2nd toe. Erythema and edema present right leg. Right LE ultrasound obtained on 10/10/18 was negative for DVT . 10/23/2018; ooLeft buttock is closed over. Still dry macerated skin but there is no open wound. I suspect this is chronic pressure/moisture ooLeft lateral calf is quite [name] bit worse than when I saw this last. There is clearly drainage here he has macerated skin into the left plantar heel. We will change the primary dressing to alginate ooLeft dorsal foot has some improvement in overall wound area. Still using clindamycin and silver alginate ooRight dorsal foot about the same as the left using clindamycin and silver alginate ooThe erythema in the right leg has resolved. He is DVT rule out was negative ooLeft heel pressure area required debridement although the wound is smaller and the surface is health 10/26/2018 ooThe patient came back in for his nurse check today predominantly because of the drainage coming out of the left lateral leg with [name] recent reopening of his original wound on the left lateral calf. He comes in today with [name] large amount of surrounding erythema around the wound extending from the calf into the ankle and even in the area on the dorsal foot. He is not systemically unwell. He is not febrile. Nevertheless this looks like  cellulitis. We have been using silver alginate to the area. I changed him to [name] regular visit and I am going to prescribe him doxycycline. The rationale here is [name] long list of medication intolerances and [name] history of MRSA. I did not see anything that I thought would provide [name] valuable culture 10/30/2018 ooFollow-up from his appointment 4 days ago with really an extensive area of cellulitis in the left calf left lateral ankle and left dorsal foot. I put him on doxycycline. He has [name] long list of medication allergies which are true allergy reactions. 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 In any case he is [name] lot better today. The erythema especially in the anterior  and lateral left calf is better. He still has left ankle erythema. He also is complaining about increasing edema in the right leg we have only been using Kerlix Coban and he has been doing the wraps at home. Finally he has [name] spotty rash on the medial part of his upper left calf which looks like folliculitis or perhaps wrap occlusion type injury. Small superficial macules not pustules 11/06/18 patient arrives today with again [name] considerable degree of erythema around the wound on the left lateral calf extending into the dorsal ankle and dorsal foot. This is [name] lot worse than when I saw this last week. He is on doxycycline really with not [name] lot of improvement. He has not been systemically unwell Wounds on the; left heel actually looks improved. Original area on the left foot and proximity to the first and second toes looks about the same. He has superficial areas on the dorsal foot, anterior calf and then the reopening of his original wound on the left lateral calf which looks about the same ooThe only area he has on the right is the dorsal webspace first and second which is smaller. ooHe has [name] large area of dry erythematous skin on the left buttock small open area  here. 11/13/2018; the patient arrives in much better condition. The erythema around the wound on the left lateral calf is [name] lot better. Not sure whether this was the clindamycin or the TCA and ketoconazole or just in the improvement in edema control [stasis dermatitis]. In any case this is [name] lot better. The area on the left heel is very small and just about resolved using silver collagen we have been using silver alginate to the areas on his dorsal feet 11/20/2018; his wounds include the left lateral calf, left heel, dorsal aspects of both feet just proximal to the first second webspace. He is stable to slightly improved. I did not think any changes to his dressings were going to be necessary 11/27/2018 he has [name] reopening on the left buttock which is surrounded by what looks like tinea or perhaps some other form of dermatitis. The area on the left dorsal foot has some erythema around it I have marked this area but I am not sure whether this is cellulitis or not. Left heel is not closed. Left calf the reopening is really slightly longer and probably worse 1/13; in general things look better and smaller except for the left dorsal foot. Area on the left heel is just about closed, left buttock looks better only [name] small wound remains in the skin looks better [using Lotrisone] 1/20; the area on the left heel only has [name] few remaining open areas here. Left lateral calf about the same in terms of size, left dorsal foot slightly larger right lateral foot still not closed. The area on the left buttock has no open wound and the surrounding skin looks [name] lot better 1/27; the area on the left heel is closed. Left lateral calf better but still requiring extensive debridements. The area on his left buttock is closed. He still has the open areas on the left dorsal foot which is slightly smaller in the right foot which is slightly expanded. We have been using Iodoflex on these areas as well 2/3; left heel is closed. Left  lateral calf still requiring debridement using Iodoflex there is no open area on his left buttock however he has dry scaly skin over [name] large area of this. Not really responding well to the Lotrisone.  Finally the areas on his dorsal feet at the level of the first second webspace are slightly smaller on the right and about the same on the left. Both of these vigorously debrided with Anasept and gauze 2/10; left heel remains closed he has dry erythematous skin over the left buttock but there is no open wound here. Left lateral leg has come in and with. Still requiring debridement we have been using Iodoflex here. Finally the area on the left dorsal foot and right dorsal foot are really about the same extremely dry callused fissured areas. He does not yet have [name] dermatology appointment 2/17; left heel remains closed. He has [name] new open area on the left buttock. The area on the left lateral calf is bigger longer and still covered in necrotic debris. No major change in his foot areas bilaterally. I am awaiting for [name] dermatologist to look on this. We have been using ketoconazole I do not know that this is been doing any good at all. 2/24; left heel remains closed. The left buttock wound that was new reopening last week looks better. The left lateral calf appears better also although still requires debridement. The major area on his foot is the left first second also requiring debridement. We have been putting Prisma on all wounds. I do not believe that the ketoconazole has done too much good for his feet. He will use Lotrisone I am going to give him [name] 2-week course of terbinafine. We still do not have [name] dermatology appointment 3/2 left heel remains closed however there is skin over bone in this area I pointed this out to him today. The left buttock wound is epithelialized but still does not look completely stable. The area on the left leg required debridement were using silver collagen here. With regards to  his feet we changed to Lotrisone last week and silver alginate. 3/9; left heel remains closed. Left buttock remains closed. The area on the right foot is essentially closed. The left foot remains unchanged. Slightly smaller on the left lateral calf. Using silver collagen to both of these areas 3/16-Left heel remains closed. Area on right foot is closed. Left lateral calf above the lateral malleolus open wound requiring debridement with easy bleeding. Left dorsal wound proximal to first toe also debrided. Left ischial area open new. Patient has been using Prisma with wrapping every 3 days. Dermatology appointment is apparently tomorrow.Patient has completed his terbinafine 2-week course with some apparent improvement according to him, there is still flaking and dry skin in his foot on the left 3/23; area on the right foot is reopened. The area on the left anterior foot is about the same still [name] very necrotic adherent surface. He still has the area on the left leg and reopening is on the left buttock. He apparently saw dermatology although I do not have [name] note. According to the patient who is usually fairly well informed they did not have any good ideas. Put him on oral terbinafine which she is been on before. 3/30; using silver collagen to all wounds. Apparently his dermatologist put him on doxycycline and rifampin presumably some culture grew staph. I do not have this result. He remains on terbinafine although I have used terbinafine on him before 4/6; patient has had [name] fairly substantial reopening on the right foot between the first and second toes. He is finished his terbinafine and I believe is on doxycycline and rifampin still as prescribed by dermatology. We have been using silver collagen  to all his wounds although the patient reports that he thinks silver alginate does better on the wounds on his buttock. 4/13; the area on his left lateral calf about the same size but it did not require  debridement. ooLeft dorsal foot just proximal to the webspace between the first and second toes is about the same. Still nonviable surface. I note some superficial bronze discoloration of the dorsal part of his foot ooRight dorsal foot just proximal to the first and second toes also looks about the same. I still think there may be the same discoloration I noted above on the left ooLeft buttock wound looks about the same 4/20; left lateral calf appears to be gradually contracting using silver collagen. 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 The left dorsal foot wound is debrided of tightly adherent necrotic debris and really cleans up quite nicely. The right area is worse with expansion. I did not debride this it is now over the base of the second toe ooThe area on his left buttock is smaller no debridement is required using silver collagen 5/4; left calf continues to make good progress. ooHe arrives with erythema around the wounds on his dorsal foot which even extends to the plantar aspect. Very concerning for coexistent infection. He is finished the erythromycin I gave him for possible erythrasma this does not seem to have helped. ooThe area on the left foot is about the same base of the dorsal toes ooIs area on the buttock looks improved on the left 5/11; left calf and left buttock continued to make good progress. Left foot is about the same to slightly improved. ooMajor problem is on the right foot. He has not had an x-ray. Deep tissue culture I did last week showed both Enterobacter and E. coli. I did not change the doxycycline I put him on empirically although neither 1 of these were plated to doxycycline. He arrives today with the erythema looking worse on both the dorsal and plantar foot. Macerated skin on the bottom of the foot. he has not been systemically unwell 5/18-Patient returns at 1 week, left calf wound appears  to be making some progress, left buttock wound appears slightly worse than last time, left foot wound looks slightly better, right foot redness is marginally better. X-ray of both feet show no air or evidence of osteomyelitis. Patient is finished his Omnicef and terbinafine. He continues to have macerated skin on the bottom of the left foot as well as right 5/26; left calf wound is better, left buttock wound appears to have multiple small superficial open areas with surrounding macerated skin. X-rays that I did last time showed no evidence of osteomyelitis in either foot. He is finished cefdinir and doxycycline. I do not think that he was on terbinafine. He continues to have [name] large superficial open area on the right foot anterior dorsal and slightly between the first and second toes. I did send him to dermatology 2 months ago or so wondering about whether they would do [name] fungal scraping. I do not believe they did but did do [name] culture. We have been using silver alginate to the toe areas, he has been using antifungals at home topically either ketoconazole or Lotrisone. We are using silver collagen on the left foot, silver alginate on the right, silver collagen on the left lateral leg and silver alginate on the left buttock 6/1; left buttock area is healed. We have the left dorsal foot, left lateral  leg and right dorsal foot. We are using silver alginate to the areas on both feet and silver collagen to the area on his left lateral calf 6/8; the left buttock apparently reopened late last week. He is not really sure how this happened. He is tolerating the terbinafine. Using silver alginate to all wounds 6/15; left buttock wound is larger than last week but still superficial. ooCame in the clinic today with [name] report of purulence from the left lateral leg I did not identify any infection ooBoth areas on his dorsal feet appear to be better. He is tolerating the terbinafine. Using silver alginate to all  wounds 6/22; left buttock is about the same this week, left calf quite [name] bit better. His left foot is about the same however he comes in with erythema and warmth in the right forefoot once again. Culture that I gave him in the beginning of May showed Enterobacter and E. coli. I gave him doxycycline and things seem to improve although neither 1 of these organisms was specifically plated. 6/29; left buttock is larger and dry this week. Left lateral calf looks to me to be improved. Left dorsal foot also somewhat improved right foot completely unchanged. The erythema on the right foot is still present. He is completing the Ceftin dinner that I gave him empirically [see discussion above.) 7/6 - All wounds look to be stable and perhaps improved, the left buttock wound is slightly smaller, per patient bleeds easily, completed ceftin, the right foot redness is less, he is on terbinafine 7/13; left buttock wound about the same perhaps slightly narrower. Area on the left lateral leg continues to narrow. Left dorsal foot slightly smaller right foot about the same. We are using silver alginate on the right foot and Hydrofera Blue to the areas on the left. Unna boot on the left 2 layer compression on the right 7/20; left buttock wound absolutely the same. Area on lateral leg continues to get better. Left dorsal foot require debridement as did the right no major change in the 7/27; left buttock wound the same size necrotic debris over the surface. The area on the lateral leg is closed once again. His left foot looks better right foot about the same although there is some involvement now of the posterior first second toe area. He is still on terbinafine which I have given him for [name] month, not certain [name] centimeter major change 06/25/19-All wounds appear to be slightly improved according to report, left buttock wound looks clean, both foot wounds have minimal to no debris the right dorsal foot has minimal slough. We  are using Hydrofera Blue to the left and silver alginate to the right foot and ischial wound. 8/10-Wounds all appear to be around the same, the right forefoot distal part has some redness which was not there before, however the wound looks clean and small. Ischial wound looks about the same with no changes 8/17; his wound on the left lateral calf which was his original chronic venous insufficiency wound remains closed. Since I last saw him the areas on the left dorsal foot right dorsal foot generally appear better but require debridement. The area on his left initial tuberosity appears somewhat larger to me perhaps hyper granulated and bleeds very easily. We have been using Hydrofera Blue to the left dorsal foot and silver alginate to everything else 8/24; left lateral calf remains closed. The areas on his dorsal feet on the webspace of the first and second toes bilaterally both look  better. The area on the left buttock which is the pressure ulcer stage II slightly smaller. I change the dressing to Hydrofera Blue to all areas 8/31; left lateral calf remains closed. The area on his dorsal feet bilaterally look better. Using Hydrofera Blue. Still requiring debridement on the left foot. No change in the left buttock pressure ulcers however 9/14; left lateral calf remains closed. Dorsal feet look quite [name] bit better than 2 weeks ago. Flaking dry skin also [name] lot better with the ammonium lactate I gave him 2 weeks ago. The area on the left buttock is improved. He states that his Roho cushion developed [name] leak and he is getting [name] new one, in the interim he is offloading this vigorously 9/21; left calf remains closed. Left heel which was [name] possible DTI looks better this week. He had macerated tissue around the left dorsal foot right foot looks satisfactory and improved left buttock wound. I changed his dressings to his feet to silver alginate bilaterally. Continuing Hydrofera Blue on the left buttock. 9/28  left calf remains closed. Left heel did not develop anything [possible DTI] dry flaking skin on the left dorsal foot. Right foot looks satisfactory. Improved left buttock wound. We are using silver alginate on his feet Hydrofera Blue on the buttock. I have asked him to go back to the Lotrisone on his feet including the wounds and surrounding areas 10/5; left calf remains closed. The areas on the left and right feet about the same. [name] lot of this is epithelialized however debris over the remaining open areas. He is using Lotrisone and silver alginate. The area on the left buttock using Hydrofera Blue 10/26. Patient has been out for 3 weeks secondary to Covid concerns. He tested negative but I think his wife tested positive. He comes in today with the left foot substantially worse, right foot about the same. Even more concerning he states that the area on his left buttock closed over but then reopened and is considerably deeper in one aspect than it was before [stage III wound] 11/2; left foot really about the same as last week. Quarter sized wound on the dorsal foot just proximal to the first second toes. Surrounding erythema with areas of denuded epithelium. This is not really much different looking. Did not look like cellulitis this time however. ooRight foot area about the same.. We have been using silver alginate alginate on his toes ooLeft buttock still substantial irritated skin around the wound which I think looks somewhat better. We have been using Hydrofera Blue here. 11/9; left foot larger than last week and [name] very necrotic surface. Right foot I think is about the same perhaps slightly smaller. Debris around the circumference also addressed. Unfortunately on the left buttock there is been [name] decline. Satellite lesions below the major wound distally and now [name] an additional one posteriorly we have been using Hydrofera Blue but I think this is [name] pressure issue 11/16; left foot ulcer dorsally  again [name] very adherent necrotic surface. Right foot is about the same. Not much change in the pressure ulcer on his left buttock. 11/30; left foot ulcer dorsally basically the same as when I saw him 2 weeks ago. Very adherent fibrinous debris on the wound surface. Patient reports [name] lot of drainage as well. 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 We have been using Iodoflex, patient changed back to alginate because of the drainage. Area on his right dorsal foot really  looks benign with [name] healthier surface certainly [name] lot better than on the left. Left buttock wounds all improved using Hydrofera Blue 12/7; left dorsal foot again no improvement. Tightly adherent debris. PCR culture I did last week only showed likely skin contaminant. I have gone ahead and done [name] punch biopsy of this which is about the last thing in terms of investigations I can think to do. He has known venous insufficiency and venous hypertension and this could be the issue here. The area on the right foot is about the same left buttock slightly worse according to our intake nurse secondary to [facility] Blue sticking to the wound 12/14; biopsy of the left foot that I did last time showed changes that could be related to wound healing/chronic stasis dermatitis phenomenon no neoplasm. We have been using silver alginate to both feet. I change the one on the left today to Sorbact and silver alginate to his other 2 wounds 12/28; the patient arrives with the following problems; ooMajor issue is the dorsal left foot which continues to be [name] larger deeper wound area. Still with [name] completely nonviable surface ooParadoxically the area mirror image on the right on the right dorsal foot appears to be getting better. ooHe had some loss of dry denuded skin from the lower part of his original wound on the left lateral calf. Some of this area looked [name] little vulnerable and for this reason we put him  in wrap that on this side this week ooThe area on his left buttock is larger. He still has the erythematous circular area which I think is [name] combination of pressure, sweat. This does not look like cellulitis or fungal dermatitis 11/26/2019; -Dorsal left foot large open wound with depth. Still debris over the surface. Using Sorbact ooThe area on the dorsal right foot paradoxically has closed over [facility] has [name] reopening on the left ankle laterally at the base of his original wound that extended up into the calf. This appears clean. ooThe left buttock wound is smaller but with very adherent necrotic debris over the surface. We have been using silver alginate here as well The patient had arterial studies done in 2017. He had biphasic waveforms at the dorsalis pedis and posterior tibial bilaterally. ABI in the left was 1.17. Digit waveforms were dampened. He has slight spasticity in the great toes I do not think [name] TBI would be possible 1/11; the patient comes in today with [name] sizable reopening between the first and second toes on the right. This is not exactly in the same location where we have been treating wounds previously. According to our intake nurse this was actually fairly deep but 0.6 cm. The area on the left dorsal foot looks about the same the surface is somewhat cleaner using Sorbact, his MRI is in 2 days. We have not managed yet to get arterial studies. The new reopening on the left lateral calf looks somewhat better using alginate. The left buttock wound is about the same using alginate 1/18; the patient had his ARTERIAL studies which were quite normal. ABI in the right at 1.13 with triphasic/biphasic waveforms on the left ABI 1.06 again with triphasic/biphasic waveforms. It would not have been possible to have done [name] toe brachial index because of spasticity. We have been using Sorbac to the left foot alginate to the rest of his wounds on the right foot left lateral calf and left  buttock 1/25; arrives in clinic with erythema and swelling of the left forefoot worse  over the first MTP area. This extends laterally dorsally and but also posteriorly. Still has an area on the left lateral part of the lower part of his calf wound it is eschared and clearly not closed. ooArea on the left buttock still with surrounding irritation and erythema. ooRight foot surface wound dorsally. The area between the right and first and second toes appears better. 2/1; ooThe left foot wound is about the same. Erythema slightly better I gave him [name] week of doxycycline empirically ooRight foot wound is more extensive extending between the toes to the plantar surface ooLeft lateral calf really no open surface on the inferior part of his original wound however the entire area still looks vulnerable ooAbsolutely no improvement in the left buttock wound required debridement. 2/8; the left foot is about the same. Erythema is slightly improved I gave him clindamycin last week. ooRight foot looks better he is using Lotrimin and silver alginate ooHe has [name] breakdown in the left lateral calf. Denuded epithelium which I have removed ooLeft buttock about the same were using Hydrofera Blue 2/15; left foot is about the same there is less surrounding erythema. Surface still has tightly adherent debris which I have debriding however not making any progress ooRight foot has [name] substantial wound on the medial right second toe between the first and second webspace. ooStill an open area on the left lateral calf distal area. ooButtock wound is about the same 2/22; left foot is about the same less surrounding erythema. Surface has adherent debris. Polymen Ag Right foot area significant wound between the first and second toes. We have been using silver alginate here Left lateral leg polymen Ag at the base of his original venous insufficiency wound ooLeft buttock some improvement here 3/1; ooRight foot is  deteriorating in the first second toe webspace. Larger and more substantial. We have been using silver alginate. ooLeft dorsal foot about the same markedly adherent surface debris using PolyMem Ag ooLeft lateral calf surface debris using PolyMem AG ooLeft buttock is improved again using PolyMem Ag. ooHe is completing his terbinafine. The erythema in the foot seems better. He has been on this for 2 weeks 3/8; no improvement in any wound area in fact he has [name] small open area on the dorsal midfoot which is new this week. He has not gotten his foot x-rays yet 3/15; his x-rays were both negative for osteomyelitis of both feet. No major change in any of his wounds on the extremities however his buttock wounds are better. We have been using polymen on the buttocks, left lower leg. Iodoflex on the left foot and silver alginate on the right 3/22; arrives in clinic today with the 2 major issues are the improvement in the left dorsal foot wound which for once actually looks healthy with [name] nice healthy wound surface without debridement. Using Iodoflex here. Unfortunately on the left lateral calf which is in the distal part of his original wound he came to the clinic here for there was purulent drainage noted some increased breakdown scattered around the original area and [name] small area proximally. We we are using polymen here will change to silver alginate today. His buttock wound on the left is better and I think the area on the right first second toe webspace is also improved 3/29; left dorsal foot looks better. Using Iodoflex. Left ankle culture from deterioration last time grew E. coli, Enterobacter and Enterococcus. I will give him [name] course of cefdinir although that will not cover Enterococcus. The  area on the right foot in the webspace of the first and second toe lateral first toe looks better. The area on his buttock is about healed Vascular appointment is on April 21. This is to look at his venous  system vis--vis continued breakdown of the wounds on the left including the left lateral leg and left dorsal foot he. He has had previous ablations on this side 4/5; the area between the right first and second toes lateral aspect of the first toe looks better. Dorsal aspect of the left first toe on the left foot also improved. Unfortunately the left lateral lower leg is larger and there is [name] second satellite wound superiorly. The usual superficial abrasions on the left buttock overall better but certainly not closed 4/12; the area between the right first and second toes is improved. Dorsal aspect of the left foot also slightly smaller with [name] vibrant healthy looking surface. No real change in the left lateral leg and the left buttock wound is healed He has an unaffordable co-pay for Aplig[employer]. Appointment with vein and vascular with regards to the left leg venous part of the circulation is on 4/21 4/19; we continue to see improvement in all wound areas. Although this is minor. He has his vascular appointment on 4/21. The area on the left buttock has not reopened although right in the center of this area the skin looks somewhat threatened 4/26; the left buttock is unfortunately reopened. In general his left dorsal foot has [name] healthy surface and looks somewhat smaller although it was not measured as such. The area between his first and second toe webspace on the right as [name] small wound against the first toe. The patient saw vascular surgery. The real question I was asking was about the small saphenous vein on the left. He has previously ablated left greater saphenous vein. Nothing further was commented on on the left. Right greater saphenous vein without reflux at the saphenofemoral junction or proximal thigh there was no indication for ablation of the right greater saphenous vein duplex was negative for DVT bilaterally. They did not think there was anything from [name] point of view that  could be offered. They ABIs within normal limits 5/3; only small open area on the left buttock. The area on the left lateral leg which was his original venous reflux is now 2 wounds both which look clean. We are using Iodoflex on the left dorsal foot which looks healthy and smaller. He is down to [name] very tiny area between the right first and second toes, using silver alginate 5/10; all of his wounds appear better. We have much better edema control in 4 layer compression on the left. This may be the factor that is allowing the left foot and left lateral calf to heal. He has external compression garments at home 04/14/20-All of his wounds are progressing well, the left forefoot is practically closed, left ischium appears to be about the same, right toe webspace is also smaller. The left lateral leg is about the same, continue using Hydrofera Blue to this, silver alginate to the ischium, Iodoflex to the toe space on the right 6/7; most of his wounds outside of the left buttock are doing well. The area on the left lateral calf and left dorsal foot are smaller. The area on the right foot in between the first and second toe webspace is barely visible although he still says there is some drainage here is the only reason I did not heal  this out. ooUnfortunately the area on the left buttock almost looks like he has [name] skin tear from tape. He has open wound and then [name] large flap of skin that we are trying to get adherence over an area just next to the remaining wound 6/21; 2 week follow-up. I believe is been here for nurse visits. Miraculously the area between his first and second toes on the left dorsal foot is closed over. Still open on the right first second web space. The left lateral calf has 2 open areas. Distally this is more superficial. The proximal area had [name] little more depth and required debridement of adherent necrotic material. His buttock wound is actually larger we have been using silver alginate  here 6/28; the patient's area on the left foot remains closed. Still open wet area between the first and second toes on the right and also extending into the plantar aspect. We have been using silver alginate in this location. He has 2 areas on the left lower leg part of his original long wounds which I think are better. We have been using Hydrofera Blue here. Hydrofera Blue to the left buttock which is stable 7/12; left foot remains closed. Left ankle is closed. May be [name] small area between his right first and second toes the only truly open area is on the left buttock. We have been using Hydrofera Blue here 7/19; patient arrives with marked deterioration especially in the left foot and ankle. We did not put him in [name] compression wrap on the left last week in fact he wore his juxta lite stockings on either side although he does not have an underlying stocking. He has [name] reopening on the left dorsal foot, left lateral ankle and [name] new area on the right dorsal ankle. More worrisome is the degree of erythema on the left foot extending on the lateral foot into the lateral lower leg on the left 7/26; the patient had erythema and drainage from the lateral left ankle last week. Culture of this grew MRSA resistant to doxycycline and clindamycin which are the 2 antibiotics we usually use with this patient who has multiple antibiotic allergies including linezolid, trimethoprim sulfamethoxazole. I had give him an empiric doxycycline and he comes in the area certainly looks somewhat better although it is blotchy in his lower leg. He has not been systemically unwell. He has had areas on the left dorsal foot which is [name] reopening, chronic wounds on the left lateral ankle. Both of these I think are secondary to chronic venous insufficiency. The area between his first and second toes is closed as far as I can tell. He had [name] new wrap injury on the right dorsal ankle last week. Finally he has an area on the left buttock. We  have been using silver alginate to everything except the left buttock we are using Hydrofera Blue 06/30/20-Patient returns at 1 week, has been given [name] sample dose pack of NUZYRA which is [name] tetracycline derivative [omadacycline], patient has completed those, we have been using silver alginate to almost all the wounds except the left ischium where we are using Hydrofera Blue all of them look better 8/16; since I last saw the patient he has been doing well. The area on the left buttock, left lateral ankle and left foot are all closed today. He has completed the [place] I gave him last time and tolerated this well. He still has open areas on the right dorsal ankle and in the right first second  toe area which we are using silver alginate. 8/23; we put him in his bilateral external compression stockings last week as he did not have anything open on either leg except for concerning area between the right first and second toe. He comes in today with an area on the left dorsal foot slightly more proximal than the original wound, the left lateral foot but this is actually [name] continuation of the area he had on the left lateral ankle from last time. As well he is opened up on the left buttock again. 8/30; comes in today with things looking [name] lot better. The area on the left lower ankle has closed down as has the left foot but with eschar in both areas. The area on the dorsal right ankle is also epithelialized. Very little remaining of the left buttock wound. We have been using silver alginate on all wound areas 9/13; the area in the first second toe webspace on the right has fully epithelialized. He still has some vulnerable epithelium on the right and the ankle and the dorsal foot. He notes weeping. He is using his juxta lite stocking. On the left again the left dorsal foot is closed left lateral ankle is closed. We went to the juxta lite stocking here as well. ooStill vulnerable in the left buttock although only 2  small open areas remain here 9/27; 2-week follow-up. We did not look at his left leg but the patient says everything is closed. He is [name] bit disturbed by the amount of edema in his left foot he is using juxta lite stockings but asking about over the toes stockings which would be 30/40, will talk to him next time. According to him there is no open wound on either the left foot or the left ankle/calf He has an open area on the dorsal right calf which I initially point [name] wrap injury. He has superficial remaining wound on the left ischial tuberosity been using silver alginate although he says this sticks to the wound 10/5; we gave him 2-week follow-up but he called yesterday expressing some concerns about his right foot right ankle and the left buttock. He came in early. There is still no open areas on the left leg and that still in his juxta lite stocking 10/11; he only has 1 small area on the left buttock that remains measuring millimeters 1 mm. Still has the same irritated skin in this area. We recommended zinc oxide when this eventually closes and pressure relief is meticulously is he can do this. He still has an area on the dorsal part of his right first through third toes which is [name] bit irritated and still open and on the dorsal ankle near the crease of the ankle. We have been using silver alginate and using his own stocking. He has nothing open on the left leg or foot 10/25; 2-week follow-up. Not nearly as good on the left buttock as I was hoping. For open areas with 5 looking threatened small. He has the erythematous irritated chronic skin in this area. oo1 area on the right dorsal ankle. He reports this area bleeds easily ooRight dorsal foot just proximal to the base of his toes ooWe have been using silver alginate. 11/8; 2-week follow-up. Left buttock is about the same although I do not think the wounds are in the same location we have been using silver alginate. I have asked him to use  zinc oxide on the skin around the wounds. ooHe still has [name] small  area on the right dorsal ankle he reports this bleeds easily ooRight dorsal foot just proximal to the base of the toes does not have anything open although the skin is very dry and scaly ooHe has [name] new opening on the nailbed of the left great toe. Nothing on the left ankle 11/29; 3-week follow-up. Left buttock has 2 open areas. And washing of these wounds today started bleeding easily. Suggesting very friable tissue. We have been using silver alginate. Right dorsal ankle which I thought was initially [name] wrap injury we have been using silver alginate. Nothing open between the toes that I can see. He states the area on the left dorsal toe nailbed healed after the last visit in 2 or 3 days 12/13; 3-week follow-up. His left buttock now has 3 open areas but the original 2 areas are smaller using polymen here. Surrounding skin looks better. The right dorsal ankle is closed. He has [name] small opening on the right dorsal foot at the level of the third toe. In general the skin looks better here. He is wearing his juxta lite stocking on the left leg says there is nothing open 11/24/2020; 3 weeks follow-up. His left buttock still has the 3 open areas. We have been using polymen but due to lack of response he changed to [facility] area. Surrounding skin is dry erythematous and irritated looking. There is no evidence of infection either bacterial or fungal however there is loss of surface epithelium ooHe still has very dry skin in his foot causing irritation and erythema on the dorsal part of his toes. This is not responded to prolonged courses of antifungal simply looks dry and irritated 1/24; left buttock area still looks about the same he was unable to find the triad ointment that we had suggested. The area on the right lower leg just above the dorsal ankle has reopened and the areas on the right foot between the first second and second third  toes and scaling on the bottom of the foot has been about the same for quite some time now. been using silver alginate to all wound areas 2/7; left buttock wound looked quite good although not much smaller in terms of surface area surrounding skin looks better. Only [name] few dry flaking areas on the right foot in between the first and second toes the skin generally looks better here [ammonium lactate]. Finally the area on the right dorsal ankle is closed Objective Constitutional Sitting or standing Blood Pressure is within target range for patient.. Pulse regular and within target range for patient.Marland Kitchen Respirations regular, non-labored and within target range.. Temperature is normal and within the target range for the patient.Marland Kitchen Appears in no distress. Vitals Time Taken: 7:42 AM, Height: 70 in, Weight: 216 lbs, BMI: 31, Temperature: 98.1 F, Pulse: 111 bpm, Respiratory Rate: 17 breaths/min, Blood Pressure: 114/69 mmHg. General Notes: Wound exam; left buttock not [name] lot better in terms of surface area although the surface looks quite good and the surrounding skin looks [name] lot better. Not precisely sure what were using on the latter. ooThe right lateral ankle is closed ooOn the right dorsal foot there is still some small open areas however the surrounding skin looks [name] lot dry less flaky. Integumentary (Hair, Skin) Wound #38R status is Open. Original cause of wound was Gradually Appeared. The wound is located on the Right T - Web between 1st and 2nd. The wound oe measures 0.4cm length x 0.2cm width x 0.2cm depth; 0.063cm^2 area and 0.013cm^3  volume. There is Fat Layer (Subcutaneous Tissue) exposed. There is no tunneling or undermining noted. There is [name] medium amount of serous drainage noted. The wound margin is flat and intact. There is medium (34-66%) pink granulation within the wound bed. There is [name] medium (34-66%) amount of necrotic tissue within the wound bed including Adherent Slough. Wound #41R  status is Open. Original cause of wound was Gradually Appeared. The wound is located on the Left Ischium. The wound measures 3.8cm length x 3.5cm width x 0.1cm depth; 10.446cm^2 area and 1.045cm^3 volume. There is Fat Layer (Subcutaneous Tissue) exposed. There is no tunneling or undermining noted. There is [name] medium amount of serosanguineous drainage noted. The wound margin is distinct with the outline attached to the wound base. There is large (67-100%) red, friable granulation within the wound bed. There is no necrotic tissue within the wound bed. Wound #47 status is Open. Original cause of wound was Gradually Appeared. The wound is located on the Right,Dorsal Foot. The wound measures 0.5cm length x 1.2cm width x 0.1cm depth; 0.471cm^2 area and 0.047cm^3 volume. There is no tunneling or undermining noted. There is [name] medium amount of serosanguineous drainage noted. The wound margin is distinct with the outline attached to the wound base. There is medium (34-66%) red, pink granulation within the wound bed. There is [name] medium (34-66%) amount of necrotic tissue within the wound bed including Adherent Slough. Wound #48 status is Healed - Epithelialized. Original cause of wound was Gradually Appeared. The wound is located on the Right,Anterior Lower Leg. The wound measures 0cm length x 0cm width x 0cm depth; 0cm^2 area and 0cm^3 volume. There is no tunneling or undermining noted. There is [name] none present amount of drainage noted. The wound margin is distinct with the outline attached to the wound base. There is no granulation within the wound bed. There is no necrotic tissue within the wound bed. 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 There was [name] Chemical/Enzymatic/Mechanical debridement performed by Levan Hurst, RN.. Other agent used was Anasept and gauze to remove biofilm. [name] time out was conducted at 08:28, prior to the start of the procedure. There was no bleeding. The procedure was tolerated well with [name] pain level of 0 throughout and [name] pain level of 0 following the procedure. Post Debridement Measurements: 3.8cm length x 3.5cm width x 0.1cm depth; 1.045cm^3 volume. Post debridement Stage noted as Category/Stage II. Character of Wound/Ulcer Post Debridement is improved. Post procedure Diagnosis Wound #41R: Same as Pre-Procedure Plan Follow-up Appointments: Return Appointment in 2 weeks. Bathing/ Shower/ Hygiene: May shower and wash wound with soap and water. - on days that dressing is changed Edema Control - Lymphedema / SCD / Other: Elevate legs to the level of the heart or above for 30 minutes daily and/or when sitting, [name] frequency of: - throughout the day Compression stocking or Garment 30-40 mm/Hg pressure to: - Juxtalite to both legs daily Off-Loading: Roho cushion for wheelchair Turn and reposition every 2 hours WOUND #38R: - T - Web between 1st and 2nd Wound Laterality: Right oe Cleanser: Soap and Water Every Other Day/30 Days Discharge Instructions: May shower and wash wound with dial antibacterial soap and water prior to  dressing change. Peri-Wound Care: Sween Lotion (Moisturizing lotion) Every Other Day/30 Days Discharge Instructions: Apply moisturizing lotion as directed Prim Dressing: KerraCel Ag Gelling Fiber Dressing, 2x2 in (silver alginate) Every Other Day/30 Days ary Discharge Instructions: Apply silver alginate to wound bed as instructed Secondary Dressing: Woven Gauze Sponge, Non-Sterile 4x4 in Every Other Day/30 Days Discharge Instructions: Apply over primary dressing as directed. Secured With: [employer],  4.5x3.1 (in/yd) Every Other Day/30 Days Discharge Instructions: Secure with Kerlix as directed. Secured With: Paper T ape, 2x10 (in/yd) Every Other Day/30 Days Discharge Instructions: Secure dressing with tape as directed. WOUND #41R: - Ischium Wound Laterality: Left Cleanser: Soap and Water Every Other Day/30 Days Discharge Instructions: May shower and wash wound with dial antibacterial soap and water prior to dressing change. Peri-Wound Care: Triad Hydrophilic Wound Dressing Tube, 6 (oz) (Generic) Every Other Day/30 Days Discharge Instructions: Apply to periwound with each dressing change Prim Dressing: PolyMem Silver Non-Adhesive Dressing, 4.25x4.25 in (DME) (Generic) Every Other Day/30 Days ary Discharge Instructions: Apply to wound bed as instructed Secondary Dressing: ComfortFoam Border, 4x4 in (silicone border) Every Other Day/30 Days Discharge Instructions: Apply over primary dressing as directed. WOUND #47: - Foot Wound Laterality: Dorsal, Right Cleanser: Soap and Water Every Other Day/30 Days Discharge Instructions: May shower and wash wound with dial antibacterial soap and water prior to dressing change. Topical: ammonium lactate Every Other Day/30 Days Discharge Instructions: apply between the toes, plantar, and dorsal foot with dressing changes. Pick up at pharmacy. Prim Dressing: KerraCel Ag Gelling Fiber Dressing, 2x2 in (silver alginate) Every Other Day/30 Days ary Discharge Instructions: Apply silver alginate to wound bed as instructed Secondary Dressing: Woven Gauze Sponge, Non-Sterile 4x4 in Every Other Day/30 Days Discharge Instructions: Apply over primary dressing as directed. Secured With: [employer], 4.5x3.1 (in/yd) (Generic) Every Other Day/30 Days Discharge Instructions: Secure with Kerlix as directed. Secured With: Paper T ape, 2x10 (in/yd) Every Other Day/30 Days Discharge Instructions: Secure dressing with tape as directed. 1. I didn't change any of  the current dressing. He is using polymen on the left buttock. Not clear what were using on the skin around I had suggested Triad however I last I checked with him he couldn't get that. 2. Silver alginate to the foot and AmLactin topical. This really seems to have helped with moisturizer and gentle exfoliate Electronic Signature(s) Signed: 12/29/2020 4:14:28 PM By: Levan Hurst RN, BSN Signed: 12/30/2020 8:19:11 AM By: Linton Ham MD Entered By: Levan Hurst on 12/29/2020 [id number] -------------------------------------------------------------------------------- [place] Details Patient Name: Date of Service: [place], [name] [name] 12/29/2020 Medical Record Number: [id number] Patient Account Number: [id number] Date of Birth/Sex: Treating RN: 1986-07-27 (35 y.o. [name] Primary Care Provider: Merriam Woods, [place] Other Clinician: Referring Provider: Treating Provider/Extender: [name]: 260 Diagnosis Coding ICD-10 Codes Code Description I87.332 Chronic venous hypertension (idiopathic) with ulcer and inflammation of left lower extremity L97.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, 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, complete Facility Procedures CPT4 Code: 89381017 Description: [id number] - DEBRIDE W/O ANES NON SELECT Modifier: Quantity: 1 Physician Procedures : CPT4 Code Description Modifier [id number] - WC PHYS LEVEL 3 - EST PT ICD-10 Diagnosis Description L97.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, stage 3 I87.332  Chronic venous hypertension (idiopathic) with ulcer and inflammation of left lower extremity Quantity:  1 Electronic Signature(s) Signed: 12/29/2020 4:14:28 PM By: Levan Hurst RN, BSN Signed: 12/30/2020 8:19:11 AM By: Linton Ham MD Entered By: Levan Hurst on  12/29/2020 09:45:52

## 2020-12-30 NOTE — Progress Notes (Signed)
[name], [name] ([id number]) Visit Report for 12/29/2020 Arrival Information Details Patient Name: Date of Service: [name] Side. 12/29/2020 7:30 A M Medical Record Number: [id number] Patient Account Number: [id number] Date of Birth/Sex: Treating RN: Mar 31, 1986 (35 y.o. [name] Primary Care Koa Zoeller: [name], [name] Other Clinician: Referring Kael Keetch: Treating Devonta Blanford/Extender: [name]: 53 Visit Information History Since Last Visit Added or deleted any medications: No Patient Arrived: Wheel Chair Any new allergies or adverse reactions: No Arrival Time: 07:39 Had a fall or experienced change in No Accompanied By: self activities of daily living that may affect Transfer Assistance: None risk of falls: Patient Identification Verified: Yes Signs or symptoms of abuse/neglect since last visito No Secondary Verification Process Completed: Yes Hospitalized since last visit: No Patient Requires Transmission-Based Precautions: No Implantable device outside of the clinic excluding No Patient Has Alerts: Yes cellular tissue based products placed in the center Patient Alerts: R ABI = 1.0 since last visit: L ABI = 1.1 Has Dressing in Place as Prescribed: Yes Pain Present Now: No Electronic Signature(s) Signed: 12/29/2020 9:06:40 AM By: Sandre Kitty Entered By: Sandre Kitty on 12/29/2020 07:42:07 -------------------------------------------------------------------------------- Encounter Discharge Information Details Patient Name: Date of Service: Apsey, [name] 12/29/2020 7:30 A M Medical Record Number: [id number] Patient Account Number: [id number] Date of Birth/Sex: Treating RN: 1986/07/01 (35 y.o. [name] Primary Care Bevely Hackbart: [place], [place] Other Clinician: Referring Samera Macy: Treating Shante Archambeault/Extender: [name]: 260 Encounter Discharge Information Items Post Procedure  Vitals Discharge Condition: Stable Temperature (F): 98.1 Ambulatory Status: Wheelchair Pulse (bpm): 111 Discharge Destination: Home Respiratory Rate (breaths/min): 17 Transportation: Private Auto Blood Pressure (mmHg): 114/69 Accompanied By: self Schedule Follow-up Appointment: Yes Clinical Summary of Care: Electronic Signature(s) Signed: 12/29/2020 4:13:29 PM By: Deon Pilling Entered By: Deon Pilling on 12/29/2020 11:56:44 -------------------------------------------------------------------------------- Lower Extremity Assessment Details Patient Name: Date of Service: [place], [name] 12/29/2020 7:30 A M Medical Record Number: [id number] Patient Account Number: [id number] Date of Birth/Sex: Treating RN: 13-Jun-1986 (35 y.o. [name] Primary Care Qunicy Higinbotham: [place], [place] Other Clinician: Referring Ilah Boule: Treating Izreal Kock/Extender: [name]: 260 Edema Assessment Assessed: [Left: No] [Right: No] Edema: [Left: Ye] [Right: s] Calf Left: Right: Point of Measurement: 33 cm From Medial Instep 33 cm Ankle Left: Right: Point of Measurement: 10 cm From Medial Instep 24 cm Vascular Assessment Pulses: Dorsalis Pedis Palpable: [Right:Yes] Electronic Signature(s) Signed: 12/29/2020 4:14:28 PM By: Levan Hurst RN, BSN Entered By: Levan Hurst on 12/29/2020 08:11:02 -------------------------------------------------------------------------------- Multi Wound Chart Details Patient Name: Date of Service: Sanko, [name] 12/29/2020 7:30 A M Medical Record Number: [id number] Patient Account Number: [id number] Date of Birth/Sex: Treating RN: 01-12-1986 (35 y.o. [name] Primary Care Dorisann Schwanke: [name], [name] Other Clinician: Referring Jasiyah Poland: Treating Suriyah Vergara/Extender: Dutch Gray, [name]: 260 Vital Signs Height(in): 70 Pulse(bpm): 111 Weight(lbs): 216 Blood Pressure(mmHg): 114/69 Body Mass  Index(BMI): 31 Temperature(F): 98.1 Respiratory Rate(breaths/min): 17 Photos: [38R:No Photos Right T - Web between 1st and 2nd Left Ischium oe] [41R:No Photos] [47:No Photos Right, Dorsal Foot] Wound Location: [38R:Gradually Appeared] [41R:Gradually Appeared] [47:Gradually Appeared] Wounding Event: [38R:Inflammatory] [41R:Pressure Ulcer] [47:Pressure Ulcer] Primary Etiology: [38R:Sleep Apnea, Hypertension, Paraplegia Sleep Apnea, Hypertension, Paraplegia Sleep Apnea, Hypertension, Paraplegia] Comorbid History: [38R:11/30/2019] [41R:03/16/2020] [47:11/02/2020] Date Acquired: [38R:56] [41R:41] [47:8] Weeks of Treatment: [38R:Open] [41R:Open] [47:Open] Wound Status: [38R:Yes] [41R:Yes] [47:No] Wound Recurrence: [38R:No] [41R:Yes] [47:No] Clustered Wound: [38R:N/A] [41R:3] [47:N/A] Clustered Quantity: [38R:0.4x0.2x0.2] [41R:3.8x3.5x0.1] [47:0.5x1.2x0.1] Measurements  L x W x D (cm) [38R:0.063] [41R:10.446] [47:0.471] A (cm) : rea [38R:0.013] [41R:1.045] [47:0.047] Volume (cm) : [38R:80.90%] [41R:-478.40%] [47:84.40%] % Reduction in Area: [38R:94.40%] [41R:-477.30%] [47:84.40%] % Reduction in Volume: [38R:Full Thickness Without Exposed] [41R:Category/Stage II] [47:Category/Stage II] Classification: [38R:Support Structures Medium] [41R:Medium] [47:Medium] Exudate Amount: [38R:Serous] [41R:Serosanguineous] [47:Serosanguineous] Exudate Type: [38R:amber] [41R:red, brown] [47:red, brown] Exudate Color: [38R:Flat and Intact] [41R:Distinct, outline attached] [47:Distinct, outline attached] Wound Margin: [38R:Medium (34-66%)] [41R:Large (67-100%)] [47:Medium (34-66%)] Granulation Amount: [38R:Pink] [41R:Red, Friable] [47:Red, Pink] Granulation Quality: [38R:Medium (34-66%)] [41R:None Present (0%)] [47:Medium (34-66%)] Necrotic Amount: [38R:Fat Layer (Subcutaneous Tissue): Yes Fat Layer (Subcutaneous Tissue): Yes Fascia: No] Exposed Structures: [38R:Fascia: No Tendon: No Muscle: No Joint: No Bone: No  Large (67-100%)] [41R:Fascia: No Tendon: No Muscle: No Joint: No Bone: No Medium (34-66%)] [47:Fat Layer (Subcutaneous Tissue): No Tendon: No Muscle: No Joint: No Bone: No Small (1-33%)] Wound Number: 48 N/A N/A Photos: No Photos N/A N/A Right, Anterior Lower Leg N/A N/A Wound Location: Gradually Appeared N/A N/A Wounding Event: Pressure Ulcer N/A N/A Primary Etiology: Sleep Apnea, Hypertension, Paraplegia N/A N/A Comorbid History: 12/08/2020 N/A N/A Date Acquired: 2 N/A N/A Weeks of Treatment: Healed - Epithelialized N/A N/A Wound Status: No N/A N/A Wound Recurrence: No N/A N/A Clustered Wound: N/A N/A N/A Clustered Quantity: 0x0x0 N/A N/A Measurements L x W x D (cm) 0 N/A N/A A (cm) : rea 0 N/A N/A Volume (cm) : 100.00% N/A N/A % Reduction in A rea: 100.00% N/A N/A % Reduction in Volume: Category/Stage II N/A N/A Classification: None Present N/A N/A Exudate A mount: N/A N/A N/A Exudate Type: N/A N/A N/A Exudate Color: Distinct, outline attached N/A N/A Wound Margin: None Present (0%) N/A N/A Granulation A mount: N/A N/A N/A Granulation Quality: None Present (0%) N/A N/A Necrotic A mount: Fascia: No N/A N/A Exposed Structures: Fat Layer (Subcutaneous Tissue): No Tendon: No Muscle: No Joint: No Bone: No Large (67-100%) N/A N/A Epithelialization: Treatment Notes Electronic Signature(s) Signed: 12/29/2020 4:14:28 PM By: Levan Hurst RN, BSN Signed: 12/30/2020 8:19:11 AM By: Linton Ham MD Entered By: Linton Ham on 12/29/2020 08:24:44 -------------------------------------------------------------------------------- Multi-Disciplinary Care Plan Details Patient Name: Date of Service: Abdallah, [name] 12/29/2020 7:30 A M Medical Record Number: [id number] Patient Account Number: [id number] Date of Birth/Sex: Treating RN: 1986/02/10 (35 y.o. [name] Primary Care Alianys Chacko: [name], [name] Other Clinician: Referring Davide Risdon: Treating  Dorri Ozturk/Extender: [name]: 260 Active Inactive Wound/Skin Impairment Nursing Diagnoses: Impaired tissue integrity Knowledge deficit related to ulceration/compromised skin integrity Goals: Patient/caregiver will verbalize understanding of skin care regimen Date Initiated: 01/05/2016 Target Resolution Date: 01/23/2021 Goal Status: Active Ulcer/skin breakdown will have a volume reduction of 30% by week 4 Date Initiated: 01/05/2016 Date Inactivated: 12/22/2017 Target Resolution Date: 01/19/2018 Unmet Reason: complex wounds, Goal Status: Unmet infection Interventions: Assess patient/caregiver ability to obtain necessary supplies Assess ulceration(s) every visit Provide education on ulcer and skin care Notes: Electronic Signature(s) Signed: 12/29/2020 4:14:28 PM By: Levan Hurst RN, BSN Entered By: Levan Hurst on 12/29/2020 07:53:26 -------------------------------------------------------------------------------- Pain Assessment Details Patient Name: Date of Service: [place], [name] 12/29/2020 7:30 A M Medical Record Number: [id number] Patient Account Number: [id number] Date of Birth/Sex: Treating RN: 1986/02/06 (35 y.o. [name] Primary Care Baya Lentz: [place], [place] Other Clinician: Referring Nixon Kolton: Treating Lakiyah Arntson/Extender: [name]: 260 Active Problems Location of Pain Severity and Description of Pain Patient Has Paino No Site Locations Pain Management and Medication Current Pain Management: Electronic  Signature(s) Signed: 12/29/2020 9:06:40 AM By: Sandre Kitty Signed: 12/29/2020 4:14:28 PM By: Levan Hurst RN, BSN Entered By: Sandre Kitty on 12/29/2020 07:42:37 -------------------------------------------------------------------------------- Patient/Caregiver Education Details Patient Name: Date of Service: Hinger, A Viviann Spare 2/7/2022andnbsp7:30 A M Medical Record Number:  [id number] Patient Account Number: [id number] Date of Birth/Gender: Treating RN: Jul 27, 1986 (35 y.o. [name] Primary Care Physician: Janine Limbo Other Clinician: Referring Physician: Treating Physician/Extender: [name]: 74 Education Assessment Education Provided To: Patient Education Topics Provided Wound/Skin Impairment: Methods: Explain/Verbal Responses: State content correctly Electronic Signature(s) Signed: 12/29/2020 4:14:28 PM By: Levan Hurst RN, BSN Entered By: Levan Hurst on 12/29/2020 07:53:39 -------------------------------------------------------------------------------- Wound Assessment Details Patient Name: Date of Service: [place], [name] 12/29/2020 7:30 A M Medical Record Number: [id number] Patient Account Number: [id number] Date of Birth/Sex: Treating RN: July 11, 1986 (35 y.o. [name] Primary Care Madysin Crisp: [name], [name] Other Clinician: Referring Samyak Sackmann: Treating Margel Joens/Extender: [name]: 260 Wound Status Wound Number: 38R Primary Etiology: Inflammatory Wound Location: Right T - Web between 1st and 2nd oe Wound Status: Open Wounding Event: Gradually Appeared Comorbid History: Sleep Apnea, Hypertension, Paraplegia Date Acquired: 11/30/2019 Weeks Of Treatment: 56 Clustered Wound: No Wound Measurements Length: (cm) 0.4 Width: (cm) 0.2 Depth: (cm) 0.2 Area: (cm) 0.063 Volume: (cm) 0.013 % Reduction in Area: 80.9% % Reduction in Volume: 94.4% Epithelialization: Large (67-100%) Tunneling: No Undermining: No Wound Description Classification: Full Thickness Without Exposed Support Structures Wound Margin: Flat and Intact Exudate Amount: Medium Exudate Type: Serous Exudate Color: amber Foul Odor After Cleansing: No Slough/Fibrino Yes Wound Bed Granulation Amount: Medium (34-66%) Exposed Structure Granulation Quality: Pink Fascia Exposed:  No Necrotic Amount: Medium (34-66%) Fat Layer (Subcutaneous Tissue) Exposed: Yes Necrotic Quality: Adherent Slough Tendon Exposed: No Muscle Exposed: No Joint Exposed: No Bone Exposed: No Treatment Notes Wound #38R (Toe - Web between 1st and 2nd) Wound Laterality: Right Cleanser Soap and Water Discharge Instruction: May shower and wash wound with dial antibacterial soap and water prior to dressing change. Peri-Wound Care Sween Lotion (Moisturizing lotion) Discharge Instruction: Apply moisturizing lotion as directed Topical Primary Dressing KerraCel Ag Gelling Fiber Dressing, 2x2 in (silver alginate) Discharge Instruction: Apply silver alginate to wound bed as instructed Secondary Dressing Woven Gauze Sponge, Non-Sterile 4x4 in Discharge Instruction: Apply over primary dressing as directed. Secured With [employer], 4.5x3.1 (in/yd) Discharge Instruction: Secure with Kerlix as directed. Paper Tape, 2x10 (in/yd) Discharge Instruction: Secure dressing with tape as directed. Compression Wrap Compression Stockings Add-Ons Electronic Signature(s) Signed: 12/29/2020 4:14:28 PM By: Levan Hurst RN, BSN Entered By: Levan Hurst on 12/29/2020 08:13:47 -------------------------------------------------------------------------------- Wound Assessment Details Patient Name: Date of Service: Zentz, [name] 12/29/2020 7:30 A M Medical Record Number: [id number] Patient Account Number: [id number] Date of Birth/Sex: Treating RN: 13-May-1986 (35 y.o. [name] Primary Care Jaret Coppedge: [place], [place] Other Clinician: Referring Holli Rengel: Treating Delva Derden/Extender: [name]: 260 Wound Status Wound Number: [id number] Primary Etiology: Pressure Ulcer Wound Location: Left Ischium Wound Status: Open Wounding Event: Gradually Appeared Comorbid History: Sleep Apnea, Hypertension, Paraplegia Date Acquired: 03/16/2020 Weeks Of Treatment:  41 Clustered Wound: Yes Wound Measurements Length: (cm) 3.8 Width: (cm) 3.5 Depth: (cm) 0.1 Clustered Quantity: 3 Area: (cm) 10.446 Volume: (cm) 1.045 % Reduction in Area: -478.4% % Reduction in Volume: -477.3% Epithelialization: Medium (34-66%) Tunneling: No Undermining: No Wound Description Classification: Category/Stage II Wound Margin: Distinct, outline attached Exudate Amount: Medium Exudate Type: Serosanguineous Exudate Color: red, brown  Foul Odor After Cleansing: No Slough/Fibrino Yes Wound Bed Granulation Amount: Large (67-100%) Exposed Structure Granulation Quality: Red, Friable Fascia Exposed: No Necrotic Amount: None Present (0%) Fat Layer (Subcutaneous Tissue) Exposed: Yes Tendon Exposed: No Muscle Exposed: No Joint Exposed: No Bone Exposed: No Treatment Notes Wound #41R (Ischium) Wound Laterality: Left Cleanser Soap and Water Discharge Instruction: May shower and wash wound with dial antibacterial soap and water prior to dressing change. Peri-Wound Care Triad Hydrophilic Wound Dressing Tube, 6 (oz) Discharge Instruction: Apply to periwound with each dressing change Topical Primary Dressing PolyMem Silver Non-Adhesive Dressing, 4.25x4.25 in Discharge Instruction: Apply to wound bed as instructed Secondary Dressing ComfortFoam Border, 4x4 in (silicone border) Discharge Instruction: Apply over primary dressing as directed. Secured With Compression Wrap Compression Stockings Environmental education officer) Signed: 12/29/2020 9:09:54 AM By: Rhae Hammock RN Signed: 12/29/2020 4:14:28 PM By: Levan Hurst RN, BSN Entered By: Rhae Hammock on 12/29/2020 07:58:14 -------------------------------------------------------------------------------- Wound Assessment Details Patient Name: Date of Service: [place], [name] 12/29/2020 7:30 A M Medical Record Number: [id number] Patient Account Number: [id number] Date of Birth/Sex: Treating RN: 07-10-1986 (35  y.o. [name] Primary Care Leilanny Fluitt: [name], [name] Other Clinician: Referring Ebony Yorio: Treating Bailey Kolbe/Extender: [name]: 260 Wound Status Wound Number: 47 Primary Etiology: Pressure Ulcer Wound Location: Right, Dorsal Foot Wound Status: Open Wounding Event: Gradually Appeared Comorbid History: Sleep Apnea, Hypertension, Paraplegia Date Acquired: 11/02/2020 Weeks Of Treatment: 8 Clustered Wound: No Wound Measurements Length: (cm) 0.5 Width: (cm) 1.2 Depth: (cm) 0.1 Area: (cm) 0.471 Volume: (cm) 0.047 % Reduction in Area: 84.4% % Reduction in Volume: 84.4% Epithelialization: Small (1-33%) Tunneling: No Undermining: No Wound Description Classification: Category/Stage II Wound Margin: Distinct, outline attached Exudate Amount: Medium Exudate Type: Serosanguineous Exudate Color: red, brown Foul Odor After Cleansing: No Slough/Fibrino Yes Wound Bed Granulation Amount: Medium (34-66%) Exposed Structure Granulation Quality: Red, Pink Fascia Exposed: No Necrotic Amount: Medium (34-66%) Fat Layer (Subcutaneous Tissue) Exposed: No Necrotic Quality: Adherent Slough Tendon Exposed: No Muscle Exposed: No Joint Exposed: No Bone Exposed: No Treatment Notes Wound #47 (Foot) Wound Laterality: Dorsal, Right Cleanser Soap and Water Discharge Instruction: May shower and wash wound with dial antibacterial soap and water prior to dressing change. Peri-Wound Care Topical ammonium lactate Discharge Instruction: apply between the toes, plantar, and dorsal foot with dressing changes. Pick up at pharmacy. Primary Dressing KerraCel Ag Gelling Fiber Dressing, 2x2 in (silver alginate) Discharge Instruction: Apply silver alginate to wound bed as instructed Secondary Dressing Woven Gauze Sponge, Non-Sterile 4x4 in Discharge Instruction: Apply over primary dressing as directed. Secured With [employer], 4.5x3.1  (in/yd) Discharge Instruction: Secure with Kerlix as directed. Paper Tape, 2x10 (in/yd) Discharge Instruction: Secure dressing with tape as directed. Compression Wrap Compression Stockings Add-Ons Electronic Signature(s) Signed: 12/29/2020 9:09:54 AM By: Rhae Hammock RN Signed: 12/29/2020 4:14:28 PM By: Levan Hurst RN, BSN Entered By: Rhae Hammock on 12/29/2020 07:58:38 -------------------------------------------------------------------------------- Wound Assessment Details Patient Name: Date of Service: [place], [name] 12/29/2020 7:30 A M Medical Record Number: [id number] Patient Account Number: [id number] Date of Birth/Sex: Treating RN: [birth year] (35 y.o. [name] Primary Care Yoland Scherr: [place], [place] Other Clinician: Referring Merrilee Ancona: Treating Norene Oliveri/Extender: [name]: 260 Wound Status Wound Number: 48 Primary Etiology: Pressure Ulcer Wound Location: Right, Anterior Lower Leg Wound Status: Healed - Epithelialized Wounding Event: Gradually Appeared Comorbid History: Sleep Apnea, Hypertension, Paraplegia Date Acquired: 12/08/2020 Weeks Of Treatment: 2 Clustered Wound: No Wound Measurements Length: (cm) Width: (  cm) Depth: (cm) Area: (cm) Volume: (cm) 0 % Reduction in Area: 100% 0 % Reduction in Volume: 100% 0 Epithelialization: Large (67-100%) 0 Tunneling: No 0 Undermining: No Wound Description Classification: Category/Stage II Wound Margin: Distinct, outline attached Exudate Amount: None Present Foul Odor After Cleansing: No Slough/Fibrino No Wound Bed Granulation Amount: None Present (0%) Exposed Structure Necrotic Amount: None Present (0%) Fascia Exposed: No Fat Layer (Subcutaneous Tissue) Exposed: No Tendon Exposed: No Muscle Exposed: No Joint Exposed: No Bone Exposed: No Electronic Signature(s) Signed: 12/29/2020 4:14:28 PM By: Levan Hurst RN, BSN Entered By: Levan Hurst on 12/29/2020  08:11:58 -------------------------------------------------------------------------------- [place] Details Patient Name: Date of Service: [place], [name] 12/29/2020 7:30 A M Medical Record Number: [id number] Patient Account Number: [id number] Date of Birth/Sex: Treating RN: Apr 09, 1986 (35 y.o. [name] Primary Care Luay Balding: [place], [place] Other Clinician: Referring Oluwanifemi Susman: Treating Mang Hazelrigg/Extender: [name]: 260 Vital Signs Time Taken: 07:42 Temperature (F): 98.1 Height (in): 70 Pulse (bpm): 111 Weight (lbs): 216 Respiratory Rate (breaths/min): 17 Body Mass Index (BMI): 31 Blood Pressure (mmHg): 114/69 Reference Range: 80 - 120 mg / dl Electronic Signature(s) Signed: 12/29/2020 9:06:40 AM By: Sandre Kitty Entered By: Sandre Kitty on 12/29/2020 07:42:27

## 2021-01-12 ENCOUNTER — Encounter (HOSPITAL_BASED_OUTPATIENT_CLINIC_OR_DEPARTMENT_OTHER): Payer: 59 | Admitting: Internal Medicine

## 2021-01-12 ENCOUNTER — Other Ambulatory Visit: Payer: Self-pay

## 2021-01-12 DIAGNOSIS — L97511 Non-pressure chronic ulcer of other part of right foot limited to breakdown of skin: Secondary | ICD-10-CM | POA: Diagnosis not present

## 2021-01-12 NOTE — Progress Notes (Signed)
Becherer, BRETON BERNS ([id number]) Visit Report for 01/12/2021 Arrival Information Details Patient Name: Date of Service: [name], [name] 01/12/2021 7:30 [name] Medical Record Number: [id number] Patient Account Number: [id number] Date of Birth/Sex: Treating RN: 1986/05/11 (35 y.o. [name] Primary Care Ventura Leggitt: [name], [name] Other Clinician: Referring Ezmae Speers: Treating Derek Laughter/Extender: [name]: 9 Visit Information History Since Last Visit Added or deleted any medications: No Patient Arrived: Wheel Chair Any new allergies or adverse reactions: No Arrival Time: 07:49 Had [name] fall or experienced change in No Accompanied By: self activities of daily living that may affect Transfer Assistance: None risk of falls: Patient Identification Verified: Yes Signs or symptoms of abuse/neglect since last visito No Secondary Verification Process Completed: Yes Hospitalized since last visit: No Patient Requires Transmission-Based Precautions: No Implantable device outside of the clinic excluding No Patient Has Alerts: Yes cellular tissue based products placed in the center Patient Alerts: R ABI = 1.0 since last visit: L ABI = 1.1 Has Dressing in Place as Prescribed: Yes Has Compression in Place as Prescribed: Yes Pain Present Now: No Electronic Signature(s) Signed: 01/12/2021 6:14:41 PM By: Deon Pilling Entered By: Deon Pilling on 01/12/2021 07:50:20 -------------------------------------------------------------------------------- Encounter Discharge Information Details Patient Name: Date of Service: [name], [name] 01/12/2021 7:30 [name] Medical Record Number: [id number] Patient Account Number: [id number] Date of Birth/Sex: Treating RN: Aug 07, 1986 (35 y.o. [name] Primary Care Amal Renbarger: [place] Other Clinician: Referring Jovante Hammitt: Treating Avonlea Sima/Extender: [name]: 262 Encounter Discharge  Information Items Post Procedure Vitals Discharge Condition: Stable Temperature (F): 98.2 Ambulatory Status: Wheelchair Pulse (bpm): 125 Discharge Destination: Home Respiratory Rate (breaths/min): 20 Transportation: Private Auto Blood Pressure (mmHg): 125/78 Accompanied By: self Schedule Follow-up Appointment: Yes Clinical Summary of Care: Electronic Signature(s) Signed: 01/12/2021 6:14:41 PM By: Deon Pilling Entered By: Deon Pilling on 01/12/2021 09:06:41 -------------------------------------------------------------------------------- Lower Extremity Assessment Details Patient Name: Date of Service: [name], [name] 01/12/2021 7:30 [name] Medical Record Number: [id number] Patient Account Number: [id number] Date of Birth/Sex: Treating RN: 11/01/1986 (35 y.o. [name] Primary Care Rahn Lacuesta: [name], [name] Other Clinician: Referring Itzayana Pardy: Treating Dmari Schubring/Extender: [name]: 262 Edema Assessment Assessed: [Left: No] [Right: Yes] Edema: [Left: Ye] [Right: s] Calf Left: Right: Point of Measurement: 33 cm From Medial Instep 33 cm Ankle Left: Right: Point of Measurement: 10 cm From Medial Instep 25 cm Vascular Assessment Pulses: Dorsalis Pedis Palpable: [Right:Yes] Electronic Signature(s) Signed: 01/12/2021 6:14:41 PM By: Deon Pilling Entered By: Deon Pilling on 01/12/2021 07:51:59 -------------------------------------------------------------------------------- Multi Wound Chart Details Patient Name: Date of Service: [name], [name] 01/12/2021 7:30 [name] Medical Record Number: [id number] Patient Account Number: [id number] Date of Birth/Sex: Treating RN: 1986/01/25 (35 y.o. [name] Primary Care Wilmar Prabhakar: [name], [name] Other Clinician: Referring Joydan Gretzinger: Treating Nicanor Mendolia/Extender: [name]: 262 Vital Signs Height(in): 70 Pulse(bpm): 125 Weight(lbs): 216 Blood  Pressure(mmHg): 125/78 Body Mass Index(BMI): 31 Temperature(F): 98.2 Respiratory Rate(breaths/min): 18 Photos: [38R:No Photos Right T - Web between 1st and 2nd Left Ischium oe] [41R:No Photos] [47:No Photos Right, Dorsal Foot] Wound Location: [38R:Gradually Appeared] [41R:Gradually Appeared] [47:Gradually Appeared] Wounding Event: [38R:Inflammatory] [41R:Pressure Ulcer] [47:Pressure Ulcer] Primary Etiology: [38R:Sleep Apnea, Hypertension, Paraplegia Sleep Apnea, Hypertension, Paraplegia Sleep Apnea, Hypertension, Paraplegia] Comorbid History: [38R:11/30/2019] [41R:03/16/2020] [47:11/02/2020] Date Acquired: [38R:58] [41R:43] [47:10] Weeks of Treatment: [38R:Open] [41R:Open] [47:Healed - Epithelialized] Wound Status: [38R:Yes] [41R:Yes] [47:No] Wound Recurrence: [38R:No] [41R:Yes] [47:No] Clustered Wound: [38R:[name]] [41R:2] [  47:[name]] Clustered Quantity: [38R:0.2x0.2x0.1] [41R:2.9x2.2x0.1] [47:0x0x0] Measurements L x W x D (cm) [38R:0.031] [41R:5.011] [47:0] [name] (cm) : rea [38R:0.003] [41R:0.501] [47:0] Volume (cm) : [38R:90.60%] [41R:-177.50%] [47:100.00%] % Reduction in Area: [38R:98.70%] [41R:-176.80%] [47:100.00%] % Reduction in Volume: [38R:Full Thickness Without Exposed] [41R:Category/Stage II] [47:Category/Stage II] Classification: [38R:Support Structures Medium] [41R:Medium] [47:None Present] Exudate [name] mount: [38R:Serous] [41R:Serosanguineous] [47:[name]] Exudate Type: [38R:amber] [41R:red, brown] [47:[name]] Exudate Color: [38R:Flat and Intact] [41R:Distinct, outline attached] [47:Distinct, outline attached] Wound Margin: [38R:Large (67-100%)] [41R:Large (67-100%)] [47:None Present (0%)] Granulation [name] mount: [38R:Pink] [41R:Red, Friable] [47:[name]] Granulation Quality: [38R:None Present (0%)] [41R:Small (1-33%)] [47:None Present (0%)] Necrotic [name] mount: [38R:[name]] [41R:Adherent Slough] [47:[name]] Necrotic Tissue: [38R:Fat Layer (Subcutaneous Tissue): Yes Fat Layer (Subcutaneous Tissue): Yes  Fascia: No] Exposed Structures: [38R:Fascia: No Tendon: No Muscle: No Joint: No Bone: No Large (67-100%)] [41R:Fascia: No Tendon: No Muscle: No Joint: No Bone: No Small (1-33%)] [47:Fat Layer (Subcutaneous Tissue): No Tendon: No Muscle: No Joint: No Bone: No Large (67-100%)] Epithelialization: [38R:[name]] [41R:[name]] [47:[name]] Debridement: [38R:[name]] [41R:[name]] [47:[name]] Tissue Debrided: [38R:[name]] [41R:[name]] [47:[name]] Level: [38R:[name]] [41R:[name]] [47:[name]] Debridement [name] (sq cm): [38R:rea [name]] [41R:[name]] [47:[name]] Instrument: [38R:[name]] [41R:[name]] [47:[name]] Bleeding: [38R:[name]] [41R:[name]] [47:[name]] Hemostasis [name] chieved: [38R:[name]] [41R:[name]] [47:[name]] Procedural Pain: [38R:[name]] [41R:[name]] [47:[name]] Post Procedural Pain: Debridement Treatment Response: [name] [41R:[name]] [47:[name]] Post Debridement Measurements L x [name] [41R:[name]] [47:[name]] W x D (cm) [38R:[name]] [41R:[name]] [47:[name]] Post Debridement Volume: (cm) [38R:[name]] [41R:[name]] [47:[name]] Wound Number: [id number] [name] Photos: No Photos No Photos [name] Left, Proximal Ischium Right, Dorsal Ankle [name] Wound Location: Shear/Friction Gradually Appeared [name] Wounding Event: Pressure Ulcer Venous Leg Ulcer [name] Primary Etiology: Sleep Apnea, Hypertension, Paraplegia Sleep Apnea, Hypertension, Paraplegia [name] Comorbid History: 01/12/2021 01/12/2021 [name] Date Acquired: 0 0 [name] Weeks of Treatment: Open Open [name] Wound Status: No No [name] Wound Recurrence: No No [name] Clustered Wound: [name] [name] [name] Clustered Quantity: 2x1x0.1 2x1.2x0.2 [name] Measurements L x W x D (cm) 1.571 1.885 [name] [name] (cm) : rea 0.157 0.377 [name] Volume (cm) : [name] [name] [name] % Reduction in [name] rea: [name] [name] [name] % Reduction in Volume: Unstageable/Unclassified Full Thickness Without Exposed [name] Classification: Support Structures Medium Medium [name] Exudate [name] mount: Serosanguineous Serosanguineous [name] Exudate Type: red, brown red, brown [name] Exudate Color: Distinct, outline attached Flat and Intact [name] Wound  Margin: Small (1-33%) Small (1-33%) [name] Granulation [name] mount: Red Pink [name] Granulation Quality: Large (67-100%) Large (67-100%) [name] Necrotic [name] mount: Adherent Slough Eschar [name] Necrotic Tissue: Fat Layer (Subcutaneous Tissue): Yes Fat Layer (Subcutaneous Tissue): Yes [name] Exposed Structures: Fascia: No Fascia: No Tendon: No Tendon: No Muscle: No Muscle: No Joint: No Joint: No Bone: No Bone: No Small (1-33%) None [name] Epithelialization: [name] Debridement - Excisional [name] Debridement: Pre-procedure Verification/Time Out [name] 08:16 [name] Taken: [name] Necrotic/Eschar, Subcutaneous [name] Tissue Debrided: [name] Skin/Subcutaneous Tissue [name] Level: [name] 2.4 [name] Debridement [name] (sq cm): rea [name] Curette [name] Instrument: [name] Minimum [name] Bleeding: [name] Pressure [name] Hemostasis [name] chieved: [name] 0 [name] Procedural Pain: [name] 0 [name] Post Procedural Pain: [name] Procedure was tolerated well [name] Debridement Treatment Response: [name] 2x1.2x0.2 [name] Post Debridement Measurements L x W x D (cm) [name] 0.377 [name] Post Debridement Volume: (cm) [name] Debridement [name] Procedures Performed: Treatment Notes Electronic Signature(s) Signed: 01/12/2021 5:41:15 PM By: Levan Hurst RN, BSN Signed: 01/12/2021 5:55:48 PM By: Linton Ham MD Entered By: Linton Ham on 01/12/2021 08:52:04 -------------------------------------------------------------------------------- Multi-Disciplinary Care Plan Details Patient Name: Date of Service: Veltri, [name] 01/12/2021 7:30 [name] Medical Record Number: [id number] Patient Account  Number: [id number] Date of Birth/Sex: Treating RN: December 15, 1985 (35 y.o. [name] Primary Care Tytionna Cloyd: [name], [name] Other Clinician: Referring Nowell Sites: Treating Ashly Goethe/Extender: [name]: [place] reviewed with physician Active Inactive Wound/Skin Impairment Nursing Diagnoses: Impaired tissue integrity Knowledge  deficit related to ulceration/compromised skin integrity Goals: Patient/caregiver will verbalize understanding of skin care regimen Date Initiated: 01/05/2016 Target Resolution Date: 01/23/2021 Goal Status: Active Ulcer/skin breakdown will have [name] volume reduction of 30% by week 4 Date Initiated: 01/05/2016 Date Inactivated: 12/22/2017 Target Resolution Date: 01/19/2018 Unmet Reason: complex wounds, Goal Status: Unmet infection Interventions: Assess patient/caregiver ability to obtain necessary supplies Assess ulceration(s) every visit Provide education on ulcer and skin care Notes: Electronic Signature(s) Signed: 01/12/2021 5:41:15 PM By: Levan Hurst RN, BSN Entered By: Levan Hurst on 01/12/2021 08:09:08 -------------------------------------------------------------------------------- Pain Assessment Details Patient Name: Date of Service: Omar, [name] 01/12/2021 7:30 [name] Medical Record Number: [id number] Patient Account Number: [id number] Date of Birth/Sex: Treating RN: [birth year] (35 y.o. [name] Primary Care Kristie Bracewell: [place], [place] Other Clinician: Referring Ashantee Deupree: Treating Chesni Vos/Extender: [name]: 262 Active Problems Location of Pain Severity and Description of Pain Patient Has Paino No Site Locations Rate the pain. Current Pain Level: 0 Pain Management and Medication Current Pain Management: Medication: No Cold Application: No Rest: No Massage: No Activity: No T.FerrellN.S.: No Heat Application: No Leg drop or elevation: No Is the Current Pain Management Adequate: Adequate How does your wound impact your activities of daily livingo Sleep: No Bathing: No Appetite: No Relationship With Others: No Bladder Continence: No Emotions: No Bowel Continence: No Work: No Toileting: No Drive: No Dressing: No Hobbies: No Electronic Signature(s) Signed: 01/12/2021 6:14:41 PM By: Deon Pilling Entered By: Deon Pilling on 01/12/2021 07:50:52 -------------------------------------------------------------------------------- Patient/Caregiver Education Details Patient Name: Date of Service: Pickup, [name] 2/21/2022andnbsp7:30 [name] Medical Record Number: [id number] Patient Account Number: [id number] Date of Birth/Gender: Treating RN: 1986-10-07 (35 y.o. [name] Primary Care Physician: Janine Limbo Other Clinician: Referring Physician: Treating Physician/Extender: [name]: 59 Education Assessment Education Provided To: Patient Education Topics Provided Wound/Skin Impairment: Methods: Explain/Verbal Responses: State content correctly Electronic Signature(s) Signed: 01/12/2021 5:41:15 PM By: Levan Hurst RN, BSN Entered By: Levan Hurst on 01/12/2021 08:09:26 -------------------------------------------------------------------------------- Wound Assessment Details Patient Name: Date of Service: Loyal, [name] 01/12/2021 7:30 [name] Medical Record Number: [id number] Patient Account Number: [id number] Date of Birth/Sex: Treating RN: June 08, 1986 (35 y.o. [name] Primary Care Oneika Simonian: [name], [name] Other Clinician: Referring Joon Pohle: Treating Keali Mccraw/Extender: [name]: 262 Wound Status Wound Number: [id number] Primary Etiology: Inflammatory Wound Location: Right T - Web between 1st and 2nd oe Wound Status: Open Wounding Event: Gradually Appeared Comorbid History: Sleep Apnea, Hypertension, Paraplegia Date Acquired: 11/30/2019 Weeks Of Treatment: 58 Clustered Wound: No Wound Measurements Length: (cm) 0.2 Width: (cm) 0.2 Depth: (cm) 0.1 Area: (cm) 0.031 Volume: (cm) 0.003 % Reduction in Area: 90.6% % Reduction in Volume: 98.7% Epithelialization: Large (67-100%) Tunneling: No Undermining: No Wound Description Classification: Full Thickness Without Exposed Support Structures Wound  Margin: Flat and Intact Exudate Amount: Medium Exudate Type: Serous Exudate Color: amber Foul Odor After Cleansing: No Slough/Fibrino Yes Wound Bed Granulation Amount: Large (67-100%) Exposed Structure Granulation Quality: Pink Fascia Exposed: No Necrotic Amount: None Present (0%) Fat Layer (Subcutaneous Tissue) Exposed: Yes Tendon Exposed: No Muscle Exposed: No Joint Exposed: No Bone Exposed: No Treatment Notes  Wound #[id number] (Toe - Web between 1st and 2nd) Wound Laterality: Right Cleanser Soap and Water Discharge Instruction: May shower and wash wound with dial antibacterial soap and water prior to dressing change. Peri-Wound Care Sween Lotion (Moisturizing lotion) Discharge Instruction: Apply moisturizing lotion as directed Topical Primary Dressing KerraCel Ag Gelling Fiber Dressing, 2x2 in (silver alginate) Discharge Instruction: Apply silver alginate to wound bed as instructed Secondary Dressing Woven Gauze Sponge, Non-Sterile 4x4 in Discharge Instruction: Apply over primary dressing as directed. Secured With [employer], 4.5x3.1 (in/yd) Discharge Instruction: Secure with Kerlix as directed. Paper Tape, 2x10 (in/yd) Discharge Instruction: Secure dressing with tape as directed. Compression Wrap Compression Stockings Add-Ons Electronic Signature(s) Signed: 01/12/2021 6:14:41 PM By: Deon Pilling Entered By: Deon Pilling on 01/12/2021 07:59:15 -------------------------------------------------------------------------------- Wound Assessment Details Patient Name: Date of Service: [name], [name] 01/12/2021 7:30 [name] Medical Record Number: [id number] Patient Account Number: [id number] Date of Birth/Sex: Treating RN: 07/08/1986 (35 y.o. [name] Primary Care Marelly Wehrman: [name], [name] Other Clinician: Referring Marchelle Rinella: Treating Larkin Alfred/Extender: [name]: 262 Wound Status Wound Number: [id number] Primary Etiology:  Pressure Ulcer Wound Location: Left Ischium Wound Status: Open Wounding Event: Gradually Appeared Comorbid History: Sleep Apnea, Hypertension, Paraplegia Date Acquired: 03/16/2020 Weeks Of Treatment: 43 Clustered Wound: Yes Photos Photo Uploaded By: Mikeal Hawthorne on 01/12/2021 14:31:38 Wound Measurements Length: (cm) 2.9 Width: (cm) 2.2 Depth: (cm) 0.1 Clustered Quantity: 2 Area: (cm) 5.011 Volume: (cm) 0.501 % Reduction in Area: -177.5% % Reduction in Volume: -176.8% Epithelialization: Small (1-33%) Tunneling: No Undermining: No Wound Description Classification: Category/Stage II Wound Margin: Distinct, outline attached Exudate Amount: Medium Exudate Type: Serosanguineous Exudate Color: red, brown Foul Odor After Cleansing: No Slough/Fibrino Yes Wound Bed Granulation Amount: Large (67-100%) Exposed Structure Granulation Quality: Red, Friable Fascia Exposed: No Necrotic Amount: Small (1-33%) Fat Layer (Subcutaneous Tissue) Exposed: Yes Necrotic Quality: Adherent Slough Tendon Exposed: No Muscle Exposed: No Joint Exposed: No Bone Exposed: No Treatment Notes Wound #[id number] (Ischium) Wound Laterality: Left Cleanser Soap and Water Discharge Instruction: May shower and wash wound with dial antibacterial soap and water prior to dressing change. Peri-Wound Care Triad Hydrophilic Wound Dressing Tube, 6 (oz) Discharge Instruction: Apply to periwound with each dressing change Topical Primary Dressing PolyMem Silver Non-Adhesive Dressing, 4.25x4.25 in Discharge Instruction: Apply to wound bed as instructed Secondary Dressing ComfortFoam Border, 4x4 in (silicone border) Discharge Instruction: Apply over primary dressing as directed. Secured With Compression Wrap Compression Stockings Environmental education officer) Signed: 01/12/2021 6:14:41 PM By: Deon Pilling Entered By: Deon Pilling on 01/12/2021  08:00:55 -------------------------------------------------------------------------------- Wound Assessment Details Patient Name: Date of Service: [name], [name] 01/12/2021 7:30 [name] Medical Record Number: [id number] Patient Account Number: [id number] Date of Birth/Sex: Treating RN: 1986/09/21 (35 y.o. [name] Primary Care Meigan Pates: [place], [place] Other Clinician: Referring Shalynn Jorstad: Treating Jovoni Borkenhagen/Extender: [name]: 262 Wound Status Wound Number: [id number] Primary Etiology: Pressure Ulcer Wound Location: Right, Dorsal Foot Wound Status: Healed - Epithelialized Wounding Event: Gradually Appeared Comorbid History: Sleep Apnea, Hypertension, Paraplegia Date Acquired: 11/02/2020 Weeks Of Treatment: 10 Clustered Wound: No Photos Photo Uploaded By: Mikeal Hawthorne on 01/12/2021 14:32:06 Wound Measurements Length: (cm) Width: (cm) Depth: (cm) Area: (cm) Volume: (cm) 0 % Reduction in Area: 100% 0 % Reduction in Volume: 100% 0 Epithelialization: Large (67-100%) 0 Tunneling: No 0 Undermining: No Wound Description Classification: Category/Stage II Wound Margin: Distinct, outline attached Exudate Amount: None Present Foul Odor After Cleansing: No Slough/Fibrino No Wound  Bed Granulation Amount: None Present (0%) Exposed Structure Necrotic Amount: None Present (0%) Fascia Exposed: No Fat Layer (Subcutaneous Tissue) Exposed: No Tendon Exposed: No Muscle Exposed: No Joint Exposed: No Bone Exposed: No Electronic Signature(s) Signed: 01/12/2021 5:41:15 PM By: Levan Hurst RN, BSN Entered By: Levan Hurst on 01/12/2021 08:23:26 -------------------------------------------------------------------------------- Wound Assessment Details Patient Name: Date of Service: [name], [name] 01/12/2021 7:30 [name] Medical Record Number: [id number] Patient Account Number: [id number] Date of Birth/Sex: Treating RN: 23-Sep-1986 (35 y.o. [name] Primary Care Clayvon Parlett: [name], [name] Other Clinician: Referring Antwon Rochin: Treating Yazan Gatling/Extender: [name]: 262 Wound Status Wound Number: 49 Primary Etiology: Pressure Ulcer Wound Location: Left, Proximal Ischium Wound Status: Open Wounding Event: Shear/Friction Comorbid History: Sleep Apnea, Hypertension, Paraplegia Date Acquired: 01/12/2021 Weeks Of Treatment: 0 Clustered Wound: No Photos Photo Uploaded By: Mikeal Hawthorne on 01/12/2021 14:31:38 Wound Measurements Length: (cm) 2 Width: (cm) 1 Depth: (cm) 0.1 Area: (cm) 1.571 Volume: (cm) 0.157 % Reduction in Area: % Reduction in Volume: Epithelialization: Small (1-33%) Tunneling: No Undermining: No Wound Description Classification: Unstageable/Unclassified Wound Margin: Distinct, outline attached Exudate Amount: Medium Exudate Type: Serosanguineous Exudate Color: red, brown Foul Odor After Cleansing: No Slough/Fibrino Yes Wound Bed Granulation Amount: Small (1-33%) Exposed Structure Granulation Quality: Red Fascia Exposed: No Necrotic Amount: Large (67-100%) Fat Layer (Subcutaneous Tissue) Exposed: Yes Necrotic Quality: Adherent Slough Tendon Exposed: No Muscle Exposed: No Joint Exposed: No Bone Exposed: No Treatment Notes Wound #49 (Ischium) Wound Laterality: Left, Proximal Cleanser Soap and Water Discharge Instruction: May shower and wash wound with dial antibacterial soap and water prior to dressing change. Peri-Wound Care Triad Hydrophilic Wound Dressing Tube, 6 (oz) Discharge Instruction: Apply to periwound with each dressing change Topical Primary Dressing PolyMem Silver Non-Adhesive Dressing, 4.25x4.25 in Discharge Instruction: Apply to wound bed as instructed Secondary Dressing ComfortFoam Border, 4x4 in (silicone border) Discharge Instruction: Apply over primary dressing as directed. Secured With Compression Wrap Compression  Stockings Environmental education officer) Signed: 01/12/2021 6:14:41 PM By: Deon Pilling Entered By: Deon Pilling on 01/12/2021 08:02:46 -------------------------------------------------------------------------------- Wound Assessment Details Patient Name: Date of Service: [name], [name] 01/12/2021 7:30 [name] Medical Record Number: [id number] Patient Account Number: [id number] Date of Birth/Sex: Treating RN: [birth year] (35 y.o. [name] Primary Care Oshua Mcconaha: [name], [name] Other Clinician: Referring Jama Mcmiller: Treating Lakesia Dahle/Extender: [name]: 262 Wound Status Wound Number: 50 Primary Etiology: Venous Leg Ulcer Wound Location: Right, Dorsal Ankle Wound Status: Open Wounding Event: Gradually Appeared Comorbid History: Sleep Apnea, Hypertension, Paraplegia Date Acquired: 01/12/2021 Weeks Of Treatment: 0 Clustered Wound: No Photos Photo Uploaded By: Mikeal Hawthorne on 01/12/2021 14:32:07 Wound Measurements Length: (cm) 2 Width: (cm) 1.2 Depth: (cm) 0.2 Area: (cm) 1.885 Volume: (cm) 0.377 % Reduction in Area: % Reduction in Volume: Epithelialization: None Tunneling: No Undermining: No Wound Description Classification: Full Thickness Without Exposed Support Structures Wound Margin: Flat and Intact Exudate Amount: Medium Exudate Type: Serosanguineous Exudate Color: red, brown Foul Odor After Cleansing: No Slough/Fibrino Yes Wound Bed Granulation Amount: Small (1-33%) Exposed Structure Granulation Quality: Pink Fascia Exposed: No Necrotic Amount: Large (67-100%) Fat Layer (Subcutaneous Tissue) Exposed: Yes Necrotic Quality: Eschar Tendon Exposed: No Muscle Exposed: No Joint Exposed: No Bone Exposed: No Treatment Notes Wound #50 (Ankle) Wound Laterality: Dorsal, Right Cleanser Soap and Water Discharge Instruction: May shower and wash wound with dial antibacterial soap and water prior to dressing change. Wound  Cleanser Discharge Instruction: Cleanse the wound with wound cleanser  prior to applying [name] clean dressing using gauze sponges, not tissue or cotton balls. Peri-Wound Care Topical Primary Dressing KerraCel Ag Gelling Fiber Dressing, 2x2 in (silver alginate) Discharge Instruction: Apply silver alginate to wound bed as instructed Secondary Dressing ComfortFoam Border, 4x4 in (silicone border) Discharge Instruction: Apply over primary dressing as directed. Secured With Compression Wrap Compression Stockings Environmental education officer) Signed: 01/12/2021 5:41:15 PM By: Levan Hurst RN, BSN Entered By: Levan Hurst on 01/12/2021 08:17:32 -------------------------------------------------------------------------------- [name] Details Patient Name: Date of Service: [name], [name] 01/12/2021 7:30 [name] Medical Record Number: [id number] Patient Account Number: [id number] Date of Birth/Sex: Treating RN: 01-19-1986 (35 y.o. [name] Primary Care Journie Howson: [place], [place] Other Clinician: Referring Edelmira Gallogly: Treating Vee Bahe/Extender: [name]: 262 Vital Signs Time Taken: 07:49 Temperature (F): 98.2 Height (in): 70 Pulse (bpm): 125 Weight (lbs): 216 Respiratory Rate (breaths/min): 18 Body Mass Index (BMI): 31 Blood Pressure (mmHg): 125/78 Reference Range: 80 - 120 mg / dl Electronic Signature(s) Signed: 01/12/2021 6:14:41 PM By: Deon Pilling Entered By: Deon Pilling on 01/12/2021 07:50:39

## 2021-01-12 NOTE — Progress Notes (Signed)
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 Using Iodoflex to the areas on his first and second toes/web space bilaterally 06/24/17; patient with history of paraplegia also chronic venous insufficiency with lymphedema. Has [name] very difficult wound on the left lateral leg. This has been gradually reducing in terms of with but comes in with [name] very dry adherent surface. High switch to silver collagen [name] week or so ago with hydrogel to keep the area moist. This is been refractory to multiple dressing attempts. He also has areas in his first and second toes bilaterally in the anterior and posterior web space. I had been using Iodoflex here after [name] prolonged course of silver alginate with ketoconazole was ineffective [question tinea pedis] 07/14/17; patient arrives today with [name] very difficult adherent material over his left lateral lower leg wound. He also has surrounding erythema and poorly controlled edema. He was switched his Santyl last visit which the nurses are applying once during his doctor visit and once on [name] nurse visit. He was also reduced to 2 layer compression I'[name] not exactly sure of the issue here. 07/21/17; better surface today after 1 week of Iodoflex. Significant cellulitis that we treated last week also better. [Doxycycline] 07/28/17 better surface today with now 2 weeks of Iodoflex.  Significant cellulitis treated with doxycycline. He has now completed the doxycycline and he is back to his usual degree of chronic venous inflammation/stasis dermatitis. He reminds me he has had ablations surgery here 08/04/17;  continued improvement with Iodoflex to the left lateral leg wound in terms of the surface of the wound although the dimensions are better. 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 Problematic areas on the plantar aspect of the first second toe web space on the left and the dorsal aspect of the first second toe web space on the right. At one point I felt these were probably related to chronic fungal infections in treated him aggressively for this although we have not made any improvement here. 08/11/17; left lateral leg. Surface continues to improve with the Iodoflex although we are not seeing much improvement in overall wound dimensions. Areas on his plantar left foot and right foot show no improvement. In fact the right foot looks somewhat worse 08/18/17; left lateral leg. We changed to [facility] Blue last week after [name] prolonged course of Iodoflex which helps get the surface better. It appears that the wound with is improved. Continue with difficult areas on the left dorsal first second and plantar first second on the right 09/01/17; patient arrives in clinic today having had [name] temperature of 103 yesterday. He was seen in the ER and [facility]. The patient was concerned he could have cellulitis again in the right leg however they diagnosed him with [name] UTI and he is now on Keflex. He has [name] history of cellulitis which is been recurrent and difficult but this is been in the left leg, in the past 5 use doxycycline. He does in and out catheterizations at home which are risk factors for UTI 09/08/17; patient will be completing his Keflex this weekend. The erythema on the left leg is considerably better. He has [name] new wound today on the medial part of the right leg small superficial almost looks like [name] skin tear. He has worsening of the area on the right dorsal first and second toe. His major area on the left lateral leg is better. Using  Hydrofera Blue on all areas 09/15/17; gradual reduction in width on the long wound in the left lateral leg. No debridement required. He also has wounds on the plantar aspect of his left first second toe web space and on the dorsal aspect of the right first second toe web space. 09/22/17; there continues to be very gradual improvements in the dimensions of the left lateral leg wound. He hasn't round erythematous spot with might be pressure on his wheelchair. There is no evidence obviously of infection no purulence no warmth He has [name] dry scaled area on the plantar aspect of the left first second toe Improved area on the dorsal right first second toe. 09/29/17; left lateral leg wound continues to improve in dimensions mostly with an is still [name] fairly long but increasingly narrow wound. He has [name] dry scaled area on the plantar aspect of his left first second toe web space Increasingly concerning area on the dorsal right first second toe. In fact I am concerned today about possible cellulitis around this wound. The areas extending up his second toe and although there is deformities here almost appears to abut on the nailbed. 10/06/17; left lateral leg wound continues to make very gradual progress. Tissue culture I did from the right first second toe dorsal foot last time grew MRSA  and enterococcus which was vancomycin sensitive. This was not sensitive to clindamycin or doxycycline. He is allergic to Zyvox and sulfa we have therefore arrange for him to have dalvance infusion tomorrow. He is had this in the past and tolerated it well 10/20/17; left lateral leg wound continues to make decent progress. This is certainly reduced in terms of with there is advancing epithelialization.The cellulitis in the right foot looks better although he still has [name] deep wound in the dorsal aspect of the first second toe web space. Plantar left first toe web space on the left I think is making some progress 10/27/17; left  lateral leg wound continues to make decent progress. Advancing epithelialization.using Hydrofera Blue The right first second toe web space wound is better-looking using silver alginate Improvement in the left plantar first second toe web space. Again using silver alginate 11/03/17 left lateral leg wound continues to make decent progress albeit slowly. Using [facility] The right per second toe web space continues to be [name] very problematic looking punched out wound. I obtained [name] piece of tissue for deep culture I did extensively treated this for fungus. It is difficult to imagine that this is [name] pressure area as the patient states other than going outside he doesn't really wear shoes at home The left plantar first second toe web space looked fairly senescent. Necrotic edges. This required debridement change to [facility] Blue to all wound areas 11/10/17; left lateral leg wound continues to contract. Using Hydrofera Blue On the right dorsal first second toe web space dorsally. Culture I did of this area last week grew MRSA there is not an easy oral option in this patient was multiple antibiotic allergies or intolerances. This was only [name] rare culture isolate I'[name] therefore going to use Bactroban under silver alginate On the left plantar first second toe web space. Debridement is required here. This is also unchanged 11/17/17; left lateral leg wound continues to contract using Hydrofera Blue this is no longer the major issue. The major concern here is the right first second toe web space. He now has an open area going from dorsally to the plantar aspect. There is now wound on the inner lateral part of the first toe. Not [name] very viable surface on this. There is erythema spreading medially into the forefoot. No major change in the left first second toe plantar wound 11/24/17; left lateral leg wound continues to contract using Hydrofera Blue. Nice improvement today The right first second toe web space all of  this looks [name] lot less angry than last week. I have given him clindamycin and topical Bactroban for MRSA and terbinafine for the possibility of underlining tinea pedis that I could not control with ketoconazole. Looks somewhat better The area on the plantar left first second toe web space is weeping with dried debris around the wound 12/01/17; left lateral leg wound continues to contract he Hydrofera Blue. It is becoming thinner in terms of with nevertheless it is making good improvement. The right first second toe web space looks less angry but still [name] large necrotic-looking wounds starting on the plantar aspect of the right foot extending between the toes and now extensively on the base of the right second toe. I gave him clindamycin and topical Bactroban for MRSA anterior benefiting for the possibility of underlying tinea pedis. Not looking better today The area on the left first/second toe looks better. Debrided of necrotic debris 12/05/17* the patient was worked in urgently today because over the weekend  he found blood on his incontinence bad when he woke up. He was found to have an ulcer by his wife who does most of his wound care. He came in today for [place] to look at this. He has not had [name] history of wounds in his buttocks in spite of his paraplegia. 12/08/17; seen in follow-up today at his usual appointment. He was seen earlier this week and found to have [name] new wound on his buttock. We also follow him for wounds on the left lateral leg, left first second toe web space and right first second toe web space 12/15/17; we have been using Hydrofera Blue to the left lateral leg which has improved. The right first second toe web space has also improved. Left first second toe web space plantar aspect looks stable. The left buttock has worsened using Santyl. Apparently the buttock has drainage 12/22/17; we have been using Hydrofera Blue to the left lateral leg which continues to improve now 2 small wounds  separated by normal skin. He tells [place] he had [name] fever up to 100 yesterday he is prone to UTIs but has not noted anything different. He does in and out catheterizations. The area between the first and second toes today does not look good necrotic surface covered with what looks to be purulent drainage and erythema extending into the third toe. I had gotten this to something that I thought look better last time however it is not look good today. He also has [name] necrotic surface over the buttock wound which is expanded. I thought there might be infection under here so I removed [name] lot of the surface with [name] #5 curet though nothing look like it really needed culturing. He is been using Santyl to this area 12/27/17; his original wound on the left lateral leg continues to improve using Hydrofera Blue. I gave him samples of Baxdella although he was unable to take them out of fear for an allergic reaction ["lump in his throat"].the culture I did of the purulent drainage from his second toe last week showed both enterococcus and [name] set Enterobacter I was also concerned about the erythema on the bottom of his foot although paradoxically although this looks somewhat better today. 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 We've been using silver alginate here. They came up today that he sleeps in [name] recliner, I'[name] not sure why but I asked him to stop this 01/03/18; his original wound we've been using Hydrofera Blue is now separated into 2 areas. Ulcer on his left buttock is better he is off the recliner and sleeping in bed Finally both wound areas between his first and second toes also looks some better 01/10/18; his original wound on the left lateral leg is now separated into 2 wounds we've been using Hydrofera Blue Ulcer on his left buttock has some drainage. There is [name] small probing site going into muscle layer superiorly.using  silver alginate -He arrives today with [name] deep tissue injury on the left heel The wound on the dorsal aspect of his first second toe on the left looks [name] lot betterusing silver alginate ketoconazole The area on the first second toe web space on the right also looks [name] lot bette 01/17/18; his original wound on the left lateral leg continues to progress using Hydrofera Blue Ulcer on his left buttock also is smaller surface healthier except for [name] small probing site going  into the muscle layer superiorly. 2.4 cm of tunneling in this area DTI on his left heel we have only been offloading. Looks better than last week no threatened open no evidence of infection the wound on the dorsal aspect of the first second toe on the left continues to look like it's regressing we have only been using silver alginate and terbinafine orally The area in the first second toe web space on the right also looks to be [name] lot better using silver alginate and terbinafine I think this was prompted by tinea pedis 01/31/18; the patient was hospitalized in [place] last week apparently for [name] complicated UTI. He was discharged on cefepime he does in and out catheterizations. In the hospital he was discovered [name] I don't mild elevation of AST and ALT and the terbinafine was stopped.predictably the pressure ulcer on s his buttock looks betterusing silver alginate. The area on the left lateral leg also is better using Hydrofera Blue. The area between the first and second toes on the left better. First and second toes on the right still substantial but better. Finally the DTI on the left heel has held together and looks like it's resolving 02/07/18-he is here in follow-up evaluation for multiple ulcerations. He has new injury to the lateral aspect of the last issue [name] pressure ulcer, he states this is from adhesive removal trauma. He states he has tried multiple adhesive products with no success. All other ulcers appear stable. The left heel DTI  is resolving. We will continue with same treatment plan and follow-up next week. 02/14/18; follow-up for multiple areas. He has [name] new area last week on the lateral aspect of his pressure ulcer more over the posterior trochanter. The original pressure ulcer looks quite stable has healthy granulation. 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 Almost fully epithelialized on the original superior area using Hydrofera Blue DTI on the left heel has peeled off this week to reveal [name] small superficial wound under denuded skin and subcutaneous tissue Both areas between the first and second toes look better including nothing open on the left 02/21/18; The patient's wounds on his left ischial tuberosity and posterior left greater trochanter actually looked better. He has [name] large area of irritation around the area which I think is contact dermatitis. I am doubtful that this is fungal His original wound on the left lateral calf continues to improve we have been using Hydrofera Blue There is no open area in the left first second toe web space although there is [name] lot of thick callus The DTI on the left heel required debridement today of necrotic surface eschar and subcutaneous tissue using silver alginate Finally the area on the right first second toe webspace continues to contract using silver alginate and ketoconazole 02/28/18 Left ischial tuberosity wounds look better using silver alginate. Original wound on the left calf only has one small open area left using Hydrofera Blue DTI on the left heel required debridement mostly removing skin from around this wound surface. Using silver alginate The areas on the right first/second toe web space using silver alginate and ketoconazole 03/08/18 on evaluation today patient appears to be doing decently well as best I can tell in regard to his wounds. This is the first  time that I have seen him as he generally is followed by Dr. Dellia Nims. With that being said none of his wounds appear to be  infected he does have an area where there is some skin covering what appears to be [name] new wound on the left dorsal surface of his great toe. This is right at the nail bed. With that being said I do believe that debrided away some of the excess skin can be of benefit in this regard. Otherwise he has been tolerating the dressing changes without complication. 03/14/18; patient arrives today with the multiplicity of wounds that we are following. He has not been systemically unwell Original wound on the left lateral calf now only has 2 small open areas we've been using Hydrofera Blue which should continue The deep tissue injury on the left heel requires debridement today. We've been using silver alginate The left first second toe and the right first second toe are both are reminiscence what I think was tinea pedis. Apparently some of the callus Surface between the toes was removed last week when it started draining. Purulent drainage coming from the wound on the ischial tuberosity on the left. 03/21/18-He is here in follow-up evaluation for multiple wounds. 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 He tolerated debridement. The only change to last week's recommendations is to discontinue antifungal cream between toes. He will follow-up next week 03/28/18; following up for multiple wounds;Concern this week is streaking redness and swelling in the right foot. He is going to need antibiotics for this. 03/31/18; follow-up for right foot cellulitis. Streaking redness and swelling in the right foot on 03/28/18. 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 I put him on clindamycin 300 mg every 6 and brought him in for [name] quick check. He has an open wound between his first and second toes on the right foot as  [name] potential source. 04/04/18; Right foot cellulitis is resolving he is completing clindamycin. This is truly good news Left lateral calf wound which is initial wound only has one small open area inferiorly this is close to healing out. He has compression stockings. We will use Hydrofera Blue right down to the epithelialization of this Nonviable surface on the left heel which was initially pressure with [name] DTI. We've been using Hydrofera Blue. I'[name] going to switch this back to silver alginate Left first second toe/tinea pedis this looks better using silver alginate Right first second toe tinea pedis using silver alginate Large pressure ulcers on theLeft ischial tuberosity. Small wound here Looks better. I am uncertain about the surface over the large wound. Using silver alginate 04/11/18; Cellulitis in the right foot is resolved Left lateral calf wound which was his original wounds still has 2 tiny open areas remaining this is just about closed Nonviable surface on the left heel is better but still requires debridement Left first second toe/tinea pedis still open using silver alginate Right first second toe wound tinea pedis I asked him to go back to using ketoconazole and silver alginate Large pressure ulcers on the left ischial tuberosity this shear injury here is resolved. Wound is smaller. No evidence of infection using silver alginate 04/18/18; Patient arrives with an intense area of cellulitis in the right mid lower calf extending into the right heel area. Bright red and warm. Smaller area on the left anterior leg. He has [name] significant history of MRSA. He will definitely need antibioticsdoxycycline He now has 2 open areas on the left ischial tuberosity the original large wound and now [name] satellite area which I think was above his initial satellite  areas. Not [name] wonderful surface on this satellite area surrounding erythema which looks like pressure related. His left lateral calf wound again his  original wound is just about closed Left heel pressure injury still requiring debridement Left first second toe looks [name] lot better using silver alginate Right first second toe also using silver alginate and ketoconazole cream also looks better 04/20/18; the patient was worked in early today out of concerns with his cellulitis on the right leg. I had started him on doxycycline. This was 2 days ago. His wife was concerned about the swelling in the area. Also concerned about the left buttock. He has not been systemically unwell no fever chills. No nausea vomiting or diarrhea 04/25/18; the patient's left buttock wound is continued to deteriorate he is using Hydrofera Blue. He is still completing clindamycin for the cellulitis on the right leg although all of this looks better. 05/02/18 Left buttock wound still with [name] lot of drainage and [name] very tightly adherent fibrinous necrotic surface. He has [name] deeper area superiorly The left lateral calf wound is still closed DTI wound on the left heel necrotic surface especially the circumference using Iodoflex Areas between his left first second toe and right first second toe both look better. Dorsally and the right first second toe he had [name] necrotic surface although at smaller. In using silver alginate and ketoconazole. I did [name] culture last week which was [name] deep tissue culture of the reminiscence of the open wound on the right first second toe dorsally. This grew [name] few Acinetobacter and [name] few methicillin-resistant staph aureus. Nevertheless the area actually this week looked better. I didn't feel the need to specifically address this at least in terms of systemic antibiotics. 05/09/18; wounds are measuring larger more drainage per our intake. We are using Santyl covered with alginate on the large superficial buttock wounds, Iodosorb on the left heel, ketoconazole and silver alginate to the dorsal first and second toes bilaterally. 05/16/18; The area on his left  buttock better in some aspects although the area superiorly over the ischial tuberosity required an extensive debridement.using Santyl Left heel appears stable. Using Iodoflex The areas between his first and second toes are not bad however there is spreading erythema up the dorsal aspect of his left foot this looks like cellulitis again. He is insensate the erythema is really very brilliant.o Erysipelas He went to see an allergist days ago because he was itching part of this he had lab work done. This showed [name] white count of 15.1 with 70% neutrophils. Hemoglobin of 11.4 and [name] platelet count of 659,000. Last white count we had in Epic was [name] 2-1/2 years ago which was 25.9 but he was ill at the time. He was able to show me some lab work that was done by his primary physician the pattern is about the same. 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 But prompted me to go ahead and do x-rays of both feet and the pelvis rule out osteomyelitis. He also had [name] comprehensive metabolic panel this was reasonably normal his albumin was 3.7 liver function tests BUN/creatinine all normal 05/23/18; x-rays of both his feet from last week were negative for underlying pulmonary abnormality. The x-ray of his pelvis however showed mild irregularity in the left ischial which may represent some early osteomyelitis. The wound in the left ischial continues to get deeper clearly now exposed muscle. Each week necrotic surface material over this area.  Whereas the rest of the wounds do not look so bad. The left ischial wound we have been using Santyl and calcium alginate T the left heel surface necrotic debris using Iodoflex o The left lateral leg is still healed Areas on the left dorsal foot and the right dorsal foot are about the same. There is some inflammation on the left which might represent contact dermatitis, fungal dermatitis I am doubtful cellulitis although this looks better  than last week 05/30/18; CT scan done at Hospital did not show any osteomyelitis or abscess. Suggested the possibility of underlying cellulitis although I don't see [name] lot of evidence of this at the bedside The wound itself on the left buttock/upper thigh actually looks somewhat better. No debridement Left heel also looks better no debridement continue Iodoflex Both dorsal first second toe spaces appear better using Lotrisone. Left still required debridement 06/06/18; Intake reported some purulent looking drainage from the left gluteal wound. Using Santyl and calcium alginate Left heel looks better although still [name] nonviable surface requiring debridement The left dorsal foot first/second webspace actually expanding and somewhat deeper. I may consider doing [name] shave biopsy of this area Right dorsal foot first/second webspace appears stable to improved. Using Lotrisone and silver alginate to both these areas 06/13/18 Left gluteal surface looks better. Now separated in the 2 wounds. No debridement required. Still drainage. We'll continue silver alginate Left heel continues to look better with Iodoflex continue this for at least another week Of his dorsal foot wounds the area on the left still has some depth although it looks better than last week. We've been using Lotrisone and silver alginate 06/20/18 Left gluteal continues to look better healthy tissue Left heel continues to look better healthy granulation wound is smaller. He is using Iodoflex and his long as this continues continue the Iodoflex Dorsal right foot looks better unfortunately dorsal left foot does not. There is swelling and erythema of his forefoot. He had minor trauma to this several days ago but doesn't think this was enough to have caused any tissue injury. Foot looks like cellulitis, we have had this problem before 06/27/18 on evaluation today patient appears to be doing [name] little worse in regard to his foot ulcer. Unfortunately it does  appear that he has methicillin-resistant staph aureus and unfortunately there really are no oral options for him as he's allergic to sulfa drugs as well as I box. Both of which would really be his only options for treating this infection. In the past he has been given and effusion of Orbactiv. This is done very well for him in the past again it's one time dosing IV antibiotic therapy. Subsequently I do believe this is something we're gonna need to see about doing at this point in time. Currently his other wounds seem to be doing somewhat better in my pinion I'[name] pretty happy in that regard. 07/03/18 on evaluation today patient's wounds actually appear to be doing fairly well. He has been tolerating the dressing changes without complication. All in all he seems to be showing signs of improvement. In regard to the antibiotics he has been dealing with infectious disease since I saw him last week as far as getting this scheduled. In the end [facility]'s [facility] this coming Friday. In the meantime he has been continuing to perform the dressing changes in such as previous. There does not appear to be any evidence of infection worsengin at  this time. 07/10/18; Since I last saw this man 2 weeks ago things have actually improved. IV antibiotics of resulted in less forefoot erythema although there is still some present. He is not systemically unwell Left buttock wounds 2 now have no depth there is increased epithelialization Using silver alginate Left heel still requires debridement using Iodoflex Left dorsal foot still with [name] sizable wound about the size of [name] border but healthy granulation Right dorsal foot still with [name] slitlike area using silver alginate 07/18/18; the patient's cellulitis in the left foot is improved in fact I think it is on its way to resolving. Left buttock wounds 2 both look better although the larger one has hypertension granulation  we've been using silver alginate Left heel has some thick circumferential redundant skin over the wound edge which will need to be removed today we've been using Iodoflex Left dorsal foot is still [name] sizable wound required debridement using silver alginate The right dorsal foot is just about closed only [name] small open area remains here 07/25/18; left foot cellulitis is resolved Left buttock wounds 2 both look better. Hyper-granulation on the major area Left heel as some debris over the surface but otherwise looks [name] healthier wound. Using silver collagen Right dorsal foot is just about closed 07/31/18; arrives with our intake nurse worried about purulent drainage from the buttock. We had hyper-granulation here last week His buttock wounds 2 continue to look better Left heel some debris over the surface but measuring smaller. Right dorsal foot unfortunately has openings between the toes Left foot superficial wound looks less aggravated. 08/07/18 Buttock wounds continue to look better although some of her granulation and the larger medial wound. silver alginate Left heel continues to look [name] lot better.silver collagen Left foot superficial wound looks less stable. Requires debridement. He has [name] new wound superficial area on the foot on the lateral dorsal foot. Right foot looks better using silver alginate without Lotrisone 08/14/2018; patient was in the ER last week diagnosed with [name] UTI. He is now on Cefpodoxime and Macrodantin. Buttock wounds continued to be smaller. Using silver alginate Left heel continues to look better using silver collagen Left foot superficial wound looks as though it is improving Right dorsal foot area is just about healed. 08/21/2018; patient is completed his antibiotics for his UTI. He has 2 open areas on the buttocks. There is still not closed although the surface looks satisfactory. Using silver alginate Left heel continues to improve using silver collagen The bilateral  dorsal foot areas which are at the base of his first and second toes/possible tinea pedis are actually stable on the left but worse on the right. The area on the left required debridement of necrotic surface. After debridement I obtained [name] specimen for PCR culture. The right dorsal foot which is been just about healed last week is now reopened 08/28/2018; culture done on the left dorsal foot showed coag negative staph both staph epidermidis and Lugdunensis. I think this is worthwhile initiating systemic treatment. I will use doxycycline given his long list of allergies. The area on the left heel slightly improved but still requiring debridement. The large wound on the buttock is just about closed whereas the smaller one is larger. Using silver alginate in this area 09/04/2018; patient is completing his doxycycline for the left foot although this continues to be [name] very difficult wound area with very adherent necrotic debris. We are using silver alginate to all his wounds right foot left foot and the small  wounds on his buttock, silver collagen on the left heel. 09/11/2018; once again this patient has intense erythema and swelling of the left forefoot. Lesser degrees of erythema in the right foot. He has [name] long list of allergies and intolerances. I will reinstitute doxycycline. 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 Using silver alginate Left heel also looks better using silver collagen Unfortunately both the areas on his feet look worse. The area on the left first second webspace is now gone through to the plantar part of his foot. The area on the left foot anteriorly is irritated with erythema and swelling in the forefoot. 09/25/2018 His wound on the left plantar heel looks better. Using silver collagen The area on the left buttock 2 small remnant areas. One is closed one is still open. Using silver alginate The areas between both his first and  second toes look worse. This in spite of long-standing antifungal therapy with ketoconazole and silver alginate which should have antifungal activity He has small areas around his original wound on the left calf one is on the bottom of the original scar tissue and one superiorly both of these are small and superficial but again given wound history in this site this is worrisome 10/02/2018 Left plantar heel continues to gradually contract using silver collagen Left buttock wound is unchanged using silver alginate The areas on his dorsal feet between his first and second toes bilaterally look about the same. I prescribed clindamycin ointment to see if we can address chronic staph colonization and also the underlying possibility of erythrasma The left lateral lower extremity wound is actually on the lateral part of his ankle. Small open area here. We have been using silver alginate 10/09/2018; Left plantar heel continues to look healthy and contract. No debridement is required Left buttock slightly smaller with [name] tape injury wound just below which was new this week Dorsal feet somewhat improved I have been using clindamycin Left lateral looks lower extremity the actual open area looks worse although [name] lot of this is epithelialized. I am going to change to silver collagen today He has [name] lot more swelling in the right leg although this is not pitting not red and not particularly warm there is [name] lot of spasm in the right leg usually indicative of people with paralysis of some underlying discomfort. We have reviewed his vascular status from 2017 he had [name] left greater saphenous vein ablation. I wonder about referring him back to vascular surgery if the area on the left leg continues to deteriorate. 10/16/2018 in today for follow-up and management of multiple lower extremity ulcers. His left Buttock wound is much lower smaller and almost closed completely. The wound to the left ankle has began to reopen  with Epithelialization and some adherent slough. He has multiple new areas to the left foot and leg. The left dorsal foot without much improvement. Wound present between left great webspace and 2nd toe. Erythema and edema present right leg. Right LE ultrasound obtained on 10/10/18 was negative for DVT . 10/23/2018; Left buttock is closed over. Still dry macerated skin but there is no open wound. I suspect this is chronic pressure/moisture Left lateral calf is quite [name] bit worse than when I saw this last. There is clearly drainage here he has macerated skin into the left plantar heel. We will change the primary dressing to alginate Left dorsal foot has some improvement in overall wound area. Still  using clindamycin and silver alginate Right dorsal foot about the same as the left using clindamycin and silver alginate The erythema in the right leg has resolved. He is DVT rule out was negative Left heel pressure area required debridement although the wound is smaller and the surface is health 10/26/2018 The patient came back in for his nurse check today predominantly because of the drainage coming out of the left lateral leg with [name] recent reopening of his original wound on the left lateral calf. He comes in today with [name] large amount of surrounding erythema around the wound extending from the calf into the ankle and even in the area on the dorsal foot. He is not systemically unwell. He is not febrile. Nevertheless this looks like cellulitis. We have been using silver alginate to the area. I changed him to [name] regular visit and I am going to prescribe him doxycycline. The rationale here is [name] long list of medication intolerances and [name] history of MRSA. I did not see anything that I thought would provide [name] valuable culture 10/30/2018 Follow-up from his appointment 4 days ago with really an extensive area of cellulitis in the left calf left lateral ankle and left dorsal foot. I put him on doxycycline. He has [name]  long list of medication allergies which are true allergy reactions. 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 In any case he is [name] lot better today. The erythema especially in the anterior and lateral left calf is better. He still has left ankle erythema. He also is complaining about increasing edema in the right leg we have only been using Kerlix Coban and he has been doing the wraps at home. Finally he has [name] spotty rash on the medial part of his upper left calf which looks like folliculitis or perhaps wrap occlusion type injury. Small superficial macules not pustules 11/06/18 patient arrives today with again [name] considerable degree of erythema around the wound on the left lateral calf extending into the dorsal ankle and dorsal foot. This is [name] lot worse than when I saw this last week. He is on doxycycline really with not [name] lot of improvement. He has not been systemically unwell Wounds on the; left heel actually looks improved. Original area on the left foot and proximity to the first and second toes looks about the same. He has superficial areas on the dorsal foot, anterior calf and then the reopening of his original wound on the left lateral calf which looks about the same The only area he has on the right is the dorsal webspace first and second which is smaller. He has [name] large area of dry erythematous skin on the left buttock small open area here. 11/13/2018; the patient arrives in much better condition. The erythema around the wound on the left lateral calf is [name] lot better. Not sure whether this was the clindamycin or the TCA and ketoconazole or just in the improvement in edema control [stasis dermatitis]. In any case this is [name] lot better. The area on the left heel is very small and just about resolved using silver collagen we have been using silver alginate to the areas on his dorsal feet 11/20/2018; his wounds include the left  lateral calf, left heel, dorsal aspects of both feet just proximal to the first second webspace. He is stable to slightly improved. I did not think any changes to his dressings were going to be necessary 11/27/2018 he  has [name] reopening on the left buttock which is surrounded by what looks like tinea or perhaps some other form of dermatitis. The area on the left dorsal foot has some erythema around it I have marked this area but I am not sure whether this is cellulitis or not. Left heel is not closed. Left calf the reopening is really slightly longer and probably worse 1/13; in general things look better and smaller except for the left dorsal foot. Area on the left heel is just about closed, left buttock looks better only [name] small wound remains in the skin looks better [using Lotrisone] 1/20; the area on the left heel only has [name] few remaining open areas here. Left lateral calf about the same in terms of size, left dorsal foot slightly larger right lateral foot still not closed. The area on the left buttock has no open wound and the surrounding skin looks [name] lot better 1/27; the area on the left heel is closed. Left lateral calf better but still requiring extensive debridements. The area on his left buttock is closed. He still has the open areas on the left dorsal foot which is slightly smaller in the right foot which is slightly expanded. We have been using Iodoflex on these areas as well 2/3; left heel is closed. Left lateral calf still requiring debridement using Iodoflex there is no open area on his left buttock however he has dry scaly skin over [name] large area of this. Not really responding well to the Lotrisone. Finally the areas on his dorsal feet at the level of the first second webspace are slightly smaller on the right and about the same on the left. Both of these vigorously debrided with Anasept and gauze 2/10; left heel remains closed he has dry erythematous skin over the left buttock but there is no  open wound here. Left lateral leg has come in and with. Still requiring debridement we have been using Iodoflex here. Finally the area on the left dorsal foot and right dorsal foot are really about the same extremely dry callused fissured areas. He does not yet have [name] dermatology appointment 2/17; left heel remains closed. He has [name] new open area on the left buttock. The area on the left lateral calf is bigger longer and still covered in necrotic debris. No major change in his foot areas bilaterally. I am awaiting for [name] dermatologist to look on this. We have been using ketoconazole I do not know that this is been doing any good at all. 2/24; left heel remains closed. The left buttock wound that was new reopening last week looks better. The left lateral calf appears better also although still requires debridement. The major area on his foot is the left first second also requiring debridement. We have been putting Prisma on all wounds. I do not believe that the ketoconazole has done too much good for his feet. He will use Lotrisone I am going to give him [name] 2-week course of terbinafine. We still do not have [name] dermatology appointment 3/2 left heel remains closed however there is skin over bone in this area I pointed this out to him today. The left buttock wound is epithelialized but still does not look completely stable. The area on the left leg required debridement were using silver collagen here. With regards to his feet we changed to Lotrisone last week and silver alginate. 3/9; left heel remains closed. Left buttock remains closed. The area on the right foot is essentially closed. The  left foot remains unchanged. Slightly smaller on the left lateral calf. Using silver collagen to both of these areas 3/16-Left heel remains closed. Area on right foot is closed. Left lateral calf above the lateral malleolus open wound requiring debridement with easy bleeding. Left dorsal wound proximal to first toe also  debrided. Left ischial area open new. Patient has been using Prisma with wrapping every 3 days. Dermatology appointment is apparently tomorrow.Patient has completed his terbinafine 2-week course with some apparent improvement according to him, there is still flaking and dry skin in his foot on the left 3/23; area on the right foot is reopened. The area on the left anterior foot is about the same still [name] very necrotic adherent surface. He still has the area on the left leg and reopening is on the left buttock. He apparently saw dermatology although I do not have [name] note. According to the patient who is usually fairly well informed they did not have any good ideas. Put him on oral terbinafine which she is been on before. 3/30; using silver collagen to all wounds. Apparently his dermatologist put him on doxycycline and rifampin presumably some culture grew staph. I do not have this result. He remains on terbinafine although I have used terbinafine on him before 4/6; patient has had [name] fairly substantial reopening on the right foot between the first and second toes. He is finished his terbinafine and I believe is on doxycycline and rifampin still as prescribed by dermatology. We have been using silver collagen to all his wounds although the patient reports that he thinks silver alginate does better on the wounds on his buttock. 4/13; the area on his left lateral calf about the same size but it did not require debridement. Left dorsal foot just proximal to the webspace between the first and second toes is about the same. Still nonviable surface. I note some superficial bronze discoloration of the dorsal part of his foot Right dorsal foot just proximal to the first and second toes also looks about the same. I still think there may be the same discoloration I noted above on the left Left buttock wound looks about the same 4/20; left lateral calf appears to be gradually contracting using silver collagen. He  remains on erythromycin empiric treatment for possible erythrasma involving his digital spaces. The left dorsal foot wound is debrided of tightly adherent necrotic debris and really cleans up quite nicely. The right area is worse with expansion. I did not debride this it is now over the base of the second toe The area on his left buttock is smaller no debridement is required using silver collagen 5/4; left calf continues to make good progress. He arrives with erythema around the wounds on his dorsal foot which even extends to the plantar aspect. Very concerning for coexistent infection. He is finished the erythromycin I gave him for possible erythrasma this does not seem to have helped. The area on the left foot is about the same base of the dorsal toes Is area on the buttock looks improved on the left 5/11; left calf and left buttock continued to make good progress. Left foot is about the same to slightly improved. Major problem is on the right foot. He has not had an x-ray. Deep tissue culture I did last week showed both Enterobacter and E. coli. I did not change the doxycycline I put him on empirically although neither 1 of these were plated to doxycycline. He arrives today with the erythema looking worse  on both the dorsal and plantar foot. Macerated skin on the bottom of the foot. he has not been systemically unwell 5/18-Patient returns at 1 week, left calf wound appears to be making some progress, left buttock wound appears slightly worse than last time, left foot wound looks slightly better, right foot redness is marginally better. X-ray of both feet show no air or evidence of osteomyelitis. Patient is finished his Omnicef and terbinafine. He continues to have macerated skin on the bottom of the left foot as well as right 5/26; left calf wound is better, left buttock wound appears to have multiple small superficial open areas with surrounding macerated skin. X-rays that I did last time showed  no evidence of osteomyelitis in either foot. He is finished cefdinir and doxycycline. I do not think that he was on terbinafine. He continues to have [name] large superficial open area on the right foot anterior dorsal and slightly between the first and second toes. I did send him to dermatology 2 months ago or so wondering about whether they would do [name] fungal scraping. I do not believe they did but did do [name] culture. We have been using silver alginate to the toe areas, he has been using antifungals at home topically either ketoconazole or Lotrisone. We are using silver collagen on the left foot, silver alginate on the right, silver collagen on the left lateral leg and silver alginate on the left buttock 6/1; left buttock area is healed. We have the left dorsal foot, left lateral leg and right dorsal foot. We are using silver alginate to the areas on both feet and silver collagen to the area on his left lateral calf 6/8; the left buttock apparently reopened late last week. He is not really sure how this happened. He is tolerating the terbinafine. Using silver alginate to all wounds 6/15; left buttock wound is larger than last week but still superficial. Came in the clinic today with [name] report of purulence from the left lateral leg I did not identify any infection Both areas on his dorsal feet appear to be better. He is tolerating the terbinafine. Using silver alginate to all wounds 6/22; left buttock is about the same this week, left calf quite [name] bit better. His left foot is about the same however he comes in with erythema and warmth in the right forefoot once again. Culture that I gave him in the beginning of May showed Enterobacter and E. coli. I gave him doxycycline and things seem to improve although neither 1 of these organisms was specifically plated. 6/29; left buttock is larger and dry this week. Left lateral calf looks to me to be improved. Left dorsal foot also somewhat improved right foot  completely unchanged. The erythema on the right foot is still present. He is completing the Ceftin dinner that I gave him empirically [see discussion above.) 7/6 - All wounds look to be stable and perhaps improved, the left buttock wound is slightly smaller, per patient bleeds easily, completed ceftin, the right foot redness is less, he is on terbinafine 7/13; left buttock wound about the same perhaps slightly narrower. Area on the left lateral leg continues to narrow. Left dorsal foot slightly smaller right foot about the same. We are using silver alginate on the right foot and Hydrofera Blue to the areas on the left. Unna boot on the left 2 layer compression on the right 7/20; left buttock wound absolutely the same. Area on lateral leg continues to get better. Left dorsal foot  require debridement as did the right no major change in the 7/27; left buttock wound the same size necrotic debris over the surface. The area on the lateral leg is closed once again. His left foot looks better right foot about the same although there is some involvement now of the posterior first second toe area. He is still on terbinafine which I have given him for [name] month, not certain [name] centimeter major change 06/25/19-All wounds appear to be slightly improved according to report, left buttock wound looks clean, both foot wounds have minimal to no debris the right dorsal foot has minimal slough. We are using Hydrofera Blue to the left and silver alginate to the right foot and ischial wound. 8/10-Wounds all appear to be around the same, the right forefoot distal part has some redness which was not there before, however the wound looks clean and small. Ischial wound looks about the same with no changes 8/17; his wound on the left lateral calf which was his original chronic venous insufficiency wound remains closed. Since I last saw him the areas on the left dorsal foot right dorsal foot generally appear better but require  debridement. The area on his left initial tuberosity appears somewhat larger to me perhaps hyper granulated and bleeds very easily. We have been using Hydrofera Blue to the left dorsal foot and silver alginate to everything else 8/24; left lateral calf remains closed. The areas on his dorsal feet on the webspace of the first and second toes bilaterally both look better. The area on the left buttock which is the pressure ulcer stage II slightly smaller. I change the dressing to Hydrofera Blue to all areas 8/31; left lateral calf remains closed. The area on his dorsal feet bilaterally look better. Using Hydrofera Blue. Still requiring debridement on the left foot. No change in the left buttock pressure ulcers however 9/14; left lateral calf remains closed. Dorsal feet look quite [name] bit better than 2 weeks ago. Flaking dry skin also [name] lot better with the ammonium lactate I gave him 2 weeks ago. The area on the left buttock is improved. He states that his Roho cushion developed [name] leak and he is getting [name] new one, in the interim he is offloading this vigorously 9/21; left calf remains closed. Left heel which was [name] possible DTI looks better this week. He had macerated tissue around the left dorsal foot right foot looks satisfactory and improved left buttock wound. I changed his dressings to his feet to silver alginate bilaterally. Continuing Hydrofera Blue on the left buttock. 9/28 left calf remains closed. Left heel did not develop anything [possible DTI] dry flaking skin on the left dorsal foot. Right foot looks satisfactory. Improved left buttock wound. We are using silver alginate on his feet Hydrofera Blue on the buttock. I have asked him to go back to the Lotrisone on his feet including the wounds and surrounding areas 10/5; left calf remains closed. The areas on the left and right feet about the same. [name] lot of this is epithelialized however debris over the remaining open areas. He is using Lotrisone  and silver alginate. The area on the left buttock using Hydrofera Blue 10/26. Patient has been out for 3 weeks secondary to Covid concerns. He tested negative but I think his wife tested positive. He comes in today with the left foot substantially worse, right foot about the same. Even more concerning he states that the area on his left buttock closed over but then reopened and  is considerably deeper in one aspect than it was before [stage III wound] 11/2; left foot really about the same as last week. Quarter sized wound on the dorsal foot just proximal to the first second toes. Surrounding erythema with areas of denuded epithelium. This is not really much different looking. Did not look like cellulitis this time however. Right foot area about the same.. We have been using silver alginate alginate on his toes Left buttock still substantial irritated skin around the wound which I think looks somewhat better. We have been using Hydrofera Blue here. 11/9; left foot larger than last week and [name] very necrotic surface. Right foot I think is about the same perhaps slightly smaller. Debris around the circumference also addressed. Unfortunately on the left buttock there is been [name] decline. Satellite lesions below the major wound distally and now [name] an additional one posteriorly we have been using Hydrofera Blue but I think this is [name] pressure issue 11/16; left foot ulcer dorsally again [name] very adherent necrotic surface. Right foot is about the same. Not much change in the pressure ulcer on his left buttock. 11/30; left foot ulcer dorsally basically the same as when I saw him 2 weeks ago. Very adherent fibrinous debris on the wound surface. Patient reports [name] lot of drainage as well. 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 We have been using Iodoflex, patient changed back to alginate because of the drainage. Area on his right dorsal foot really looks benign with [name]  healthier surface certainly [name] lot better than on the left. Left buttock wounds all improved using Hydrofera Blue 12/7; left dorsal foot again no improvement. Tightly adherent debris. PCR culture I did last week only showed likely skin contaminant. I have gone ahead and done [name] punch biopsy of this which is about the last thing in terms of investigations I can think to do. He has known venous insufficiency and venous hypertension and this could be the issue here. The area on the right foot is about the same left buttock slightly worse according to our intake nurse secondary to [facility] Blue sticking to the wound 12/14; biopsy of the left foot that I did last time showed changes that could be related to wound healing/chronic stasis dermatitis phenomenon no neoplasm. We have been using silver alginate to both feet. I change the one on the left today to Sorbact and silver alginate to his other 2 wounds 12/28; the patient arrives with the following problems; Major issue is the dorsal left foot which continues to be [name] larger deeper wound area. Still with [name] completely nonviable surface Paradoxically the area mirror image on the right on the right dorsal foot appears to be getting better. He had some loss of dry denuded skin from the lower part of his original wound on the left lateral calf. Some of this area looked [name] little vulnerable and for this reason we put him in wrap that on this side this week The area on his left buttock is larger. He still has the erythematous circular area which I think is [name] combination of pressure, sweat. This does not look like cellulitis or fungal dermatitis 11/26/2019; -Dorsal left foot large open wound with depth. Still debris over the surface. Using Sorbact The area on the dorsal right foot paradoxically has closed over He has [name] reopening on the left ankle laterally at the base of his original wound that extended up into the calf. This appears clean.  The left buttock wound  is smaller but with very adherent necrotic debris over the surface. We have been using silver alginate here as well The patient had arterial studies done in 2017. He had biphasic waveforms at the dorsalis pedis and posterior tibial bilaterally. ABI in the left was 1.17. Digit waveforms were dampened. He has slight spasticity in the great toes I do not think [name] TBI would be possible 1/11; the patient comes in today with [name] sizable reopening between the first and second toes on the right. This is not exactly in the same location where we have been treating wounds previously. According to our intake nurse this was actually fairly deep but 0.6 cm. The area on the left dorsal foot looks about the same the surface is somewhat cleaner using Sorbact, his MRI is in 2 days. We have not managed yet to get arterial studies. The new reopening on the left lateral calf looks somewhat better using alginate. The left buttock wound is about the same using alginate 1/18; the patient had his ARTERIAL studies which were quite normal. ABI in the right at 1.13 with triphasic/biphasic waveforms on the left ABI 1.06 again with triphasic/biphasic waveforms. It would not have been possible to have done [name] toe brachial index because of spasticity. We have been using Sorbac to the left foot alginate to the rest of his wounds on the right foot left lateral calf and left buttock 1/25; arrives in clinic with erythema and swelling of the left forefoot worse over the first MTP area. This extends laterally dorsally and but also posteriorly. Still has an area on the left lateral part of the lower part of his calf wound it is eschared and clearly not closed. Area on the left buttock still with surrounding irritation and erythema. Right foot surface wound dorsally. The area between the right and first and second toes appears better. 2/1; The left foot wound is about the same. Erythema slightly better I gave him [name] week of doxycycline  empirically Right foot wound is more extensive extending between the toes to the plantar surface Left lateral calf really no open surface on the inferior part of his original wound however the entire area still looks vulnerable Absolutely no improvement in the left buttock wound required debridement. 2/8; the left foot is about the same. Erythema is slightly improved I gave him clindamycin last week. Right foot looks better he is using Lotrimin and silver alginate He has [name] breakdown in the left lateral calf. Denuded epithelium which I have removed Left buttock about the same were using Hydrofera Blue 2/15; left foot is about the same there is less surrounding erythema. Surface still has tightly adherent debris which I have debriding however not making any progress Right foot has [name] substantial wound on the medial right second toe between the first and second webspace. Still an open area on the left lateral calf distal area. Buttock wound is about the same 2/22; left foot is about the same less surrounding erythema. Surface has adherent debris. Polymen Ag Right foot area significant wound between the first and second toes. We have been using silver alginate here Left lateral leg polymen Ag at the base of his original venous insufficiency wound Left buttock some improvement here 3/1; Right foot is deteriorating in the first second toe webspace. Larger and more substantial. We have been using silver alginate. Left dorsal foot about the same markedly adherent surface debris using PolyMem Ag Left lateral calf surface debris using PolyMem  AG Left buttock is improved again using PolyMem Ag. He is completing his terbinafine. The erythema in the foot seems better. He has been on this for 2 weeks 3/8; no improvement in any wound area in fact he has [name] small open area on the dorsal midfoot which is new this week. He has not gotten his foot x-rays yet 3/15; his x-rays were both negative for osteomyelitis  of both feet. No major change in any of his wounds on the extremities however his buttock wounds are better. We have been using polymen on the buttocks, left lower leg. Iodoflex on the left foot and silver alginate on the right 3/22; arrives in clinic today with the 2 major issues are the improvement in the left dorsal foot wound which for once actually looks healthy with [name] nice healthy wound surface without debridement. Using Iodoflex here. Unfortunately on the left lateral calf which is in the distal part of his original wound he came to the clinic here for there was purulent drainage noted some increased breakdown scattered around the original area and [name] small area proximally. We we are using polymen here will change to silver alginate today. His buttock wound on the left is better and I think the area on the right first second toe webspace is also improved 3/29; left dorsal foot looks better. Using Iodoflex. Left ankle culture from deterioration last time grew E. coli, Enterobacter and Enterococcus. I will give him [name] course of cefdinir although that will not cover Enterococcus. The area on the right foot in the webspace of the first and second toe lateral first toe looks better. The area on his buttock is about healed Vascular appointment is on April 21. This is to look at his venous system vis--vis continued breakdown of the wounds on the left including the left lateral leg and left dorsal foot he. He has had previous ablations on this side 4/5; the area between the right first and second toes lateral aspect of the first toe looks better. Dorsal aspect of the left first toe on the left foot also improved. Unfortunately the left lateral lower leg is larger and there is [name] second satellite wound superiorly. The usual superficial abrasions on the left buttock overall better but certainly not closed 4/12; the area between the right first and second toes is improved. Dorsal aspect of the left foot also  slightly smaller with [name] vibrant healthy looking surface. No real change in the left lateral leg and the left buttock wound is healed He has an unaffordable co-pay for Apl[employer]. Appointment with vein and vascular with regards to the left leg venous part of the circulation is on 4/21 4/19; we continue to see improvement in all wound areas. Although this is minor. He has his vascular appointment on 4/21. The area on the left buttock has not reopened although right in the center of this area the skin looks somewhat threatened 4/26; the left buttock is unfortunately reopened. In general his left dorsal foot has [name] healthy surface and looks somewhat smaller although it was not measured as such. The area between his first and second toe webspace on the right as [name] small wound against the first toe. The patient saw vascular surgery. The real question I was asking was about the small saphenous vein on the left. He has previously ablated left greater saphenous vein. Nothing further was commented on on the left. Right greater saphenous vein without reflux at the saphenofemoral junction or proximal thigh there was  no indication for ablation of the right greater saphenous vein duplex was negative for DVT bilaterally. They did not think there was anything from [name] vascular surgery point of view that could be offered. They ABIs within normal limits 5/3; only small open area on the left buttock. The area on the left lateral leg which was his original venous reflux is now 2 wounds both which look clean. We are using Iodoflex on the left dorsal foot which looks healthy and smaller. He is down to [name] very tiny area between the right first and second toes, using silver alginate 5/10; all of his wounds appear better. We have much better edema control in 4 layer compression on the left. This may be the factor that is allowing the left foot and left lateral calf to heal. He has external compression garments at home 04/14/20-All  of his wounds are progressing well, the left forefoot is practically closed, left ischium appears to be about the same, right toe webspace is also smaller. The left lateral leg is about the same, continue using Hydrofera Blue to this, silver alginate to the ischium, Iodoflex to the toe space on the right 6/7; most of his wounds outside of the left buttock are doing well. The area on the left lateral calf and left dorsal foot are smaller. The area on the right foot in between the first and second toe webspace is barely visible although he still says there is some drainage here is the only reason I did not heal this out. Unfortunately the area on the left buttock almost looks like he has [name] skin tear from tape. He has open wound and then [name] large flap of skin that we are trying to get adherence over an area just next to the remaining wound 6/21; 2 week follow-up. I believe is been here for nurse visits. Miraculously the area between his first and second toes on the left dorsal foot is closed over. Still open on the right first second web space. The left lateral calf has 2 open areas. Distally this is more superficial. The proximal area had [name] little more depth and required debridement of adherent necrotic material. His buttock wound is actually larger we have been using silver alginate here 6/28; the patient's area on the left foot remains closed. Still open wet area between the first and second toes on the right and also extending into the plantar aspect. We have been using silver alginate in this location. He has 2 areas on the left lower leg part of his original long wounds which I think are better. We have been using Hydrofera Blue here. Hydrofera Blue to the left buttock which is stable 7/12; left foot remains closed. Left ankle is closed. May be [name] small area between his right first and second toes the only truly open area is on the left buttock. We have been using Hydrofera Blue here 7/19; patient  arrives with marked deterioration especially in the left foot and ankle. We did not put him in [name] compression wrap on the left last week in fact he wore his juxta lite stockings on either side although he does not have an underlying stocking. He has [name] reopening on the left dorsal foot, left lateral ankle and [name] new area on the right dorsal ankle. More worrisome is the degree of erythema on the left foot extending on the lateral foot into the lateral lower leg on the left 7/26; the patient had erythema and drainage from the lateral  left ankle last week. Culture of this grew MRSA resistant to doxycycline and clindamycin which are the 2 antibiotics we usually use with this patient who has multiple antibiotic allergies including linezolid, trimethoprim sulfamethoxazole. I had give him an empiric doxycycline and he comes in the area certainly looks somewhat better although it is blotchy in his lower leg. He has not been systemically unwell. He has had areas on the left dorsal foot which is [name] reopening, chronic wounds on the left lateral ankle. Both of these I think are secondary to chronic venous insufficiency. The area between his first and second toes is closed as far as I can tell. He had [name] new wrap injury on the right dorsal ankle last week. Finally he has an area on the left buttock. We have been using silver alginate to everything except the left buttock we are using Hydrofera Blue 06/30/20-Patient returns at 1 week, has been given [name] sample dose pack of NUZYRA which is [name] tetracycline derivative [omadacycline], patient has completed those, we have been using silver alginate to almost all the wounds except the left ischium where we are using Hydrofera Blue all of them look better 8/16; since I last saw the patient he has been doing well. The area on the left buttock, left lateral ankle and left foot are all closed today. He has completed the [place] I gave him last time and tolerated this well. He still has  open areas on the right dorsal ankle and in the right first second toe area which we are using silver alginate. 8/23; we put him in his bilateral external compression stockings last week as he did not have anything open on either leg except for concerning area between the right first and second toe. He comes in today with an area on the left dorsal foot slightly more proximal than the original wound, the left lateral foot but this is actually [name] continuation of the area he had on the left lateral ankle from last time. As well he is opened up on the left buttock again. 8/30; comes in today with things looking [name] lot better. The area on the left lower ankle has closed down as has the left foot but with eschar in both areas. The area on the dorsal right ankle is also epithelialized. Very little remaining of the left buttock wound. We have been using silver alginate on all wound areas 9/13; the area in the first second toe webspace on the right has fully epithelialized. He still has some vulnerable epithelium on the right and the ankle and the dorsal foot. He notes weeping. He is using his juxta lite stocking. On the left again the left dorsal foot is closed left lateral ankle is closed. We went to the juxta lite stocking here as well. Still vulnerable in the left buttock although only 2 small open areas remain here 9/27; 2-week follow-up. We did not look at his left leg but the patient says everything is closed. He is [name] bit disturbed by the amount of edema in his left foot he is using juxta lite stockings but asking about over the toes stockings which would be 30/40, will talk to him next time. According to him there is no open wound on either the left foot or the left ankle/calf He has an open area on the dorsal right calf which I initially point [name] wrap injury. He has superficial remaining wound on the left ischial tuberosity been using silver alginate although he says this  sticks to the wound 10/5; we  gave him 2-week follow-up but he called yesterday expressing some concerns about his right foot right ankle and the left buttock. He came in early. There is still no open areas on the left leg and that still in his juxta lite stocking 10/11; he only has 1 small area on the left buttock that remains measuring millimeters 1 mm. Still has the same irritated skin in this area. We recommended zinc oxide when this eventually closes and pressure relief is meticulously is he can do this. He still has an area on the dorsal part of his right first through third toes which is [name] bit irritated and still open and on the dorsal ankle near the crease of the ankle. We have been using silver alginate and using his own stocking. He has nothing open on the left leg or foot 10/25; 2-week follow-up. Not nearly as good on the left buttock as I was hoping. For open areas with 5 looking threatened small. He has the erythematous irritated chronic skin in this area. 1 area on the right dorsal ankle. He reports this area bleeds easily Right dorsal foot just proximal to the base of his toes We have been using silver alginate. 11/8; 2-week follow-up. Left buttock is about the same although I do not think the wounds are in the same location we have been using silver alginate. I have asked him to use zinc oxide on the skin around the wounds. He still has [name] small area on the right dorsal ankle he reports this bleeds easily Right dorsal foot just proximal to the base of the toes does not have anything open although the skin is very dry and scaly He has [name] new opening on the nailbed of the left great toe. Nothing on the left ankle 11/29; 3-week follow-up. Left buttock has 2 open areas. And washing of these wounds today started bleeding easily. Suggesting very friable tissue. We have been using silver alginate. Right dorsal ankle which I thought was initially [name] wrap injury we have been using silver alginate. Nothing open between the  toes that I can see. He states the area on the left dorsal toe nailbed healed after the last visit in 2 or 3 days 12/13; 3-week follow-up. His left buttock now has 3 open areas but the original 2 areas are smaller using polymen here. Surrounding skin looks better. The right dorsal ankle is closed. He has [name] small opening on the right dorsal foot at the level of the third toe. In general the skin looks better here. He is wearing his juxta lite stocking on the left leg says there is nothing open 11/24/2020; 3 weeks follow-up. His left buttock still has the 3 open areas. We have been using polymen but due to lack of response he changed to [facility] area. Surrounding skin is dry erythematous and irritated looking. There is no evidence of infection either bacterial or fungal however there is loss of surface epithelium He still has very dry skin in his foot causing irritation and erythema on the dorsal part of his toes. This is not responded to prolonged courses of antifungal simply looks dry and irritated 1/24; left buttock area still looks about the same he was unable to find the triad ointment that we had suggested. The area on the right lower leg just above the dorsal ankle has reopened and the areas on the right foot between the first second and second third toes and scaling on  the bottom of the foot has been about the same for quite some time now. been using silver alginate to all wound areas 2/7; left buttock wound looked quite good although not much smaller in terms of surface area surrounding skin looks better. Only [name] few dry flaking areas on the right foot in between the first and second toes the skin generally looks better here [ammonium lactate]. Finally the area on the right dorsal ankle is closed 2/21; There is no open area on the right foot even between the right first and second toe. Skin around this area dorsally and plantar aspects look better. He has [name] reopening of the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 Iodoflex. He has [name] thick eschar on the left first second toe interface, I suspect this may be fungal however there is no visible open 12/23/16; no open wound on his right leg. He has 2 small areas left of the linear wound that was remaining last week. We have been using Prisma, I thought I have disclosed this week, we can only look forward to next week 01/03/17; the patient had concerning areas of erythema last week, already on doxycycline for UTI through his primary doctor. The erythema is absolutely no better there is warmth and swelling both medially from the left lateral leg wound and also the dorsal left foot. 01/06/17- Patient is here for follow-up evaluation of his left lateral leg ulcer and bilateral feet ulcers. He is on oral antibiotic therapy, tolerating that. Nursing staff and the patient states that the erythema is improved from Monday. 01/13/17; the predominant left lateral leg wound continues to be problematic. I had put Apligraf on him earlier this month once. However he subsequently developed what appeared to be an intense cellulitis around the left lateral leg wound. I gave him Dalvance I think on 2/12 perhaps 2/13 he continues on cefdinir. The erythema is still present but the warmth and swelling is improved. I am hopeful that the cellulitis part of this control. I wouldn't be surprised if there is an element of venous inflammation as well. 01/17/17. The  erythema is present but better in the left leg. His left lateral leg wound still  does not have [name] viable surface buttons certain parts of this long thin wound it appears like there has been improvement in dimensions. 01/20/17; the erythema still present but much better in the left leg. I'[name] thinking this is his usual degree of chronic venous inflammation. The wound on the left leg looks somewhat better. Is less surface slough 01/27/17; erythema is back to the chronic venous inflammation. The wound on the left leg is somewhat better. I am back to the point where I like to try an Apligraf once again 02/10/17; slight improvement in wound dimensions. Apligraf #2. He is completing his doxycycline 02/14/17; patient arrives today having completed doxycycline last Thursday. This was supposed to be [name] nurse visit however once again he hasn't tense erythema from the medial part of his wound extending over the lower leg. Also erythema in his foot this is roughly in the same distribution as last time. He has baseline chronic venous inflammation however this is [name] lot worse than the baseline I have learned to accept the on him is baseline inflammation 02/24/17- patient is here for follow-up evaluation. He is tolerating compression therapy. His voicing no complaints or concerns he is here anticipating an Apligraf 03/03/17; he arrives today with an adherent necrotic surface. I don't think this is surface is going to be amenable for Apligraf's. The erythema around his wound and on the left dorsal foot has resolved he is off antibiotics 03/10/17; better-looking surface today. I don't think he can tolerate Apligraf's. He tells me he had [name] wound VAC after [name] skin graft years ago to this area and they had difficulty with [name] seal. The erythema continues to be stable around this some degree of chronic venous inflammation but he also has recurrent cellulitis. We have been using Iodoflex 03/17/17; continued improvement in the surface and  may be small changes in dimensions. Using Iodoflex which seems the only thing that will control his surface 03/24/17- He is here for follow up evaluation of his LLE lateral ulceration and ulcer to right dorsal foot/toe space. He is voicing no complaints or concerns, He is tolerating compression wrap. 03/31/17 arrives today with [name] much healthier looking wound on the left lower extremity. We have been using Iodoflex for [name] prolonged period of time which has for the first time prepared and adequate looking wound bed although we have not had much in the way of wound dimension improvement. He also has [name] small wound between the first and second toe on the right 04/07/17; arrives today with [name] healthy-looking wound bed and at least the top 50% of this wound appears to be now her. No debridement was required I have changed him to [facility] last week after prolonged Iodoflex. He did not do well with Apligraf's. We've had [name] re-opening between the first and second toe on the right 04/14/17; arrives today with [name] healthier looking wound bed contractions and the top 50% of this wound and some on the lesser 50%. Wound bed appears healthy. The area between the first and second toe on the right still remains problematic 04/21/17; continued very gradual improvement. Using [facility] 04/28/17; continued very gradual improvement in the left lateral leg venous insufficiency wound. His periwound erythema is very mild. We have been using Hydrofera Blue. Wound is making progress especially in the superior 50% 05/05/17; he continues to have very gradual improvement in the left lateral venous insufficiency wound. Both in terms with an length rings are improving. I debrided this every 2 weeks with #  5 curet and we have been using Hydrofera Blue and again making good progress With regards to the wounds between his right first and second toe which I thought might of been tinea pedis he is not making as much progress very dry  scaly skin over the area. Also the area at the base of the left first and second toe in [name] similar condition 05/12/17; continued gradual improvement in the refractory left lateral venous insufficiency wound on the left. Dimension smaller. Surface still requiring debridement using Hydrofera Blue 05/19/17; continued gradual improvement in the refractory left lateral venous ulceration. Careful inspection of the wound bed underlying rumination suggested some degree of epithelialization over the surface no debridement indicated. Continue Hydrofera Blue difficult areas between his toes first and third on the left than first and second on the right. I'[name] going to change to silver alginate from silver collagen. Continue ketoconazole as I suspect underlying tinea pedis 05/26/17; left lateral leg venous insufficiency wound. We've been using Hydrofera Blue. I believe that there is expanding epithelialization over the surface of the wound albeit not coming from the wound circumference. This is [name] bit of an odd situation in which the epithelialization seems to be coming from the surface of the wound rather than in the exact circumference. There is still small open areas mostly along the lateral margin of the wound. ooHe has unchanged areas between the left first and second and the right first second toes which I been treating for tenia pedis 06/02/17; left lateral leg venous insufficiency wound. We have been using Hydrofera Blue. Somewhat smaller from the wound circumference. The surface of the wound remains [name] bit on it almost epithelialized sedation in appearance. I use an open curette today debridement in the surface of all of this especially the edges ooSmall open wounds remaining on the dorsal right first and second toe interspace and the plantar left first second toe and her face on the left 06/09/17; wound on the left lateral leg continues to be smaller but very gradual and very dry surface using Hydrofera  Blue 06/16/17 requires weekly debridements now on the left lateral leg although this continues to contract. I changed to silver collagen last week because of dryness of the wound bed. Using Iodoflex to the areas on his first and second toes/web space bilaterally 06/24/17; patient with history of paraplegia also chronic venous insufficiency with lymphedema. Has [name] very difficult wound on the left lateral leg. This has been gradually reducing in terms of with but comes in with [name] very dry adherent surface. High switch to silver collagen [name] week or so ago with hydrogel to keep the area moist. This is been refractory to multiple dressing attempts. He also has areas in his first and second toes bilaterally in the anterior and posterior web space. I had been using Iodoflex here after [name] prolonged course of silver alginate with ketoconazole was ineffective [question tinea pedis] 07/14/17; patient arrives today with [name] very difficult adherent material over his left lateral lower leg wound. He also has surrounding erythema and poorly controlled edema. He was switched his Santyl last visit which the nurses are applying once during his doctor visit and once on [name] nurse visit. He was also reduced to 2 layer compression I'[name] not exactly sure of the issue here. 07/21/17; better surface today after 1 week of Iodoflex. Significant cellulitis that we treated last week also better. [Doxycycline] 07/28/17 better surface today with now 2 weeks of Iodoflex. Significant cellulitis treated with doxycycline. He  has now completed the doxycycline and he is back to his usual degree of chronic venous inflammation/stasis dermatitis. He reminds me he has had ablations surgery here 08/04/17; continued improvement with Iodoflex to the left lateral leg wound in terms of the surface of the wound although the dimensions are better. 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  Problematic areas on the plantar aspect of the first second toe web space on the left and the dorsal aspect of the first second toe web space on the right. At one point I felt these were probably related to chronic fungal infections in treated him aggressively for this although we have not made any improvement here. 08/11/17; left lateral leg. Surface continues to improve with the Iodoflex although we are not seeing much improvement in overall wound dimensions. Areas on his plantar left foot and right foot show no improvement. In fact the right foot looks somewhat worse 08/18/17; left lateral leg. We changed to [place] [facility] Blue last week after [name] prolonged course of Iodoflex which helps get the surface better. It appears that the wound with is improved. Continue with difficult areas on the left dorsal first second and plantar first second on the right 09/01/17; patient arrives in clinic today having had [name] temperature of 103 yesterday. He was seen in the ER and [facility]. The patient was concerned he could have cellulitis again in the right leg however they diagnosed him with [name] UTI and he is now on Keflex. He has [name] history of cellulitis which is been recurrent and difficult but this is been in the left leg, in the past 5 use doxycycline. He does in and out catheterizations at home which are risk factors for UTI 09/08/17; patient will be completing his Keflex this weekend. The erythema on the left leg is considerably better. He has [name] new wound today on the medial part of the right leg small superficial almost looks like [name] skin tear. He has worsening of the area on the right dorsal first and second toe. His major area on the left lateral leg is better. Using Hydrofera Blue on all areas 09/15/17; gradual reduction in width on the long wound in the left lateral leg. No debridement required. He also has wounds on the plantar aspect of his left first second toe web space and on the dorsal aspect of the right first  second toe web space. 09/22/17; there continues to be very gradual improvements in the dimensions of the left lateral leg wound. He hasn't round erythematous spot with might be pressure on his wheelchair. There is no evidence obviously of infection no purulence no warmth ooHe has [name] dry scaled area on the plantar aspect of the left first second toe ooImproved area on the dorsal right first second toe. 09/29/17; left lateral leg wound continues to improve in dimensions mostly with an is still [name] fairly long but increasingly narrow wound. ooHe has [name] dry scaled area on the plantar aspect of his left first second toe web space ooIncreasingly concerning area on the dorsal right first second toe. In fact I am concerned today about possible cellulitis around this wound. The areas extending up his second toe and although there is deformities here almost appears to abut on the nailbed. 10/06/17; left lateral leg wound continues to make very gradual progress. Tissue culture I did from the right first second toe dorsal foot last time grew MRSA and enterococcus which was vancomycin sensitive.  This was not sensitive to clindamycin or doxycycline. He is allergic to Zyvox and sulfa we have therefore arrange for him to have dalvance infusion tomorrow. He is had this in the past and tolerated it well 10/20/17; left lateral leg wound continues to make decent progress. This is certainly reduced in terms of with there is advancing epithelialization.ooThe cellulitis in the right foot looks better although he still has [name] deep wound in the dorsal aspect of the first second toe web space. Plantar left first toe web space on the left I think is making some progress 10/27/17; left lateral leg wound continues to make decent progress. Advancing epithelialization.using Hydrofera Blue ooThe right first second toe web space wound is better-looking using silver alginate ooImprovement in the left plantar first second toe web  space. Again using silver alginate 11/03/17 left lateral leg wound continues to make decent progress albeit slowly. Using Hydrofera Blue ooThe right per second toe web space continues to be [name] very problematic looking punched out wound. I obtained [name] piece of tissue for deep culture I did extensively treated this for fungus. It is difficult to imagine that this is [name] pressure area as the patient states other than going outside he doesn't really wear shoes at home ooThe left plantar first second toe web space looked fairly senescent. Necrotic edges. This required debridement oochange to Hydrofera Blue to all wound areas 11/10/17; left lateral leg wound continues to contract. Using Hydrofera Blue ooOn the right dorsal first second toe web space dorsally. Culture I did of this area last week grew MRSA there is not an easy oral option in this patient was multiple antibiotic allergies or intolerances. This was only [name] rare culture isolate I'[name] therefore going to use Bactroban under silver alginate ooOn the left plantar first second toe web space. Debridement is required here. This is also unchanged 11/17/17; left lateral leg wound continues to contract using Hydrofera Blue this is no longer the major issue. ooThe major concern here is the right first second toe web space. He now has an open area going from dorsally to the plantar aspect. There is now wound on the inner lateral part of the first toe. Not [name] very viable surface on this. There is erythema spreading medially into the forefoot. ooNo major change in the left first second toe plantar wound 11/24/17; left lateral leg wound continues to contract using Hydrofera Blue. Nice improvement today ooThe right first second toe web space all of this looks [name] lot less angry than last week. I have given him clindamycin and topical Bactroban for MRSA and terbinafine for the possibility of underlining tinea pedis that I could not control with ketoconazole.  Looks somewhat better ooThe area on the plantar left first second toe web space is weeping with dried debris around the wound 12/01/17; left lateral leg wound continues to contract he Hydrofera Blue. It is becoming thinner in terms of with nevertheless it is making good improvement. ooThe right first second toe web space looks less angry but still [name] large necrotic-looking wounds starting on the plantar aspect of the right foot extending between the toes and now extensively on the base of the right second toe. I gave him clindamycin and topical Bactroban for MRSA anterior benefiting for the possibility of underlying tinea pedis. Not looking better today ooThe area on the left first/second toe looks better. Debrided of necrotic debris 12/05/17* the patient was worked in urgently today because over the weekend he found blood on his incontinence  bad when he woke up. He was found to have an ulcer by his wife who does most of his wound care. He came in today for [place] to look at this. He has not had [name] history of wounds in his buttocks in spite of his paraplegia. 12/08/17; seen in follow-up today at his usual appointment. He was seen earlier this week and found to have [name] new wound on his buttock. We also follow him for wounds on the left lateral leg, left first second toe web space and right first second toe web space 12/15/17; we have been using Hydrofera Blue to the left lateral leg which has improved. The right first second toe web space has also improved. Left first second toe web space plantar aspect looks stable. The left buttock has worsened using Santyl. Apparently the buttock has drainage 12/22/17; we have been using Hydrofera Blue to the left lateral leg which continues to improve now 2 small wounds separated by normal skin. He tells [place] he had [name] fever up to 100 yesterday he is prone to UTIs but has not noted anything different. He does in and out catheterizations. The area between the first and second  toes today does not look good necrotic surface covered with what looks to be purulent drainage and erythema extending into the third toe. I had gotten this to something that I thought look better last time however it is not look good today. He also has [name] necrotic surface over the buttock wound which is expanded. I thought there might be infection under here so I removed [name] lot of the surface with [name] #5 curet though nothing look like it really needed culturing. He is been using Santyl to this area 12/27/17; his original wound on the left lateral leg continues to improve using Hydrofera Blue. I gave him samples of Baxdella although he was unable to take them out of fear for an allergic reaction ["lump in his throat"].the culture I did of the purulent drainage from his second toe last week showed both enterococcus and [name] set Enterobacter I was also concerned about the erythema on the bottom of his foot although paradoxically although this looks somewhat better today. 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 We've been using silver alginate here. They came up today that he sleeps in [name] recliner, I'[name] not sure why but I asked him to stop this 01/03/18; his original wound we've been using Hydrofera Blue is now separated into 2 areas. ooUlcer on his left buttock is better he is off the recliner and sleeping in bed ooFinally both wound areas between his first and second toes also looks some better 01/10/18; his original wound on the left lateral leg is now separated into 2 wounds we've been using Hydrofera Blue ooUlcer on his left buttock has some drainage. There is [name] small probing site going into muscle layer superiorly.using silver alginate -He arrives today with [name] deep tissue injury on the left heel ooThe wound on the dorsal aspect of his first second toe on the left looks [name] lot betterusing silver alginate  ketoconazole ooThe area on the first second toe web space on the right also looks [name] lot bette 01/17/18; his original wound on the left lateral leg continues to progress using Hydrofera Blue ooUlcer on his left buttock also is smaller surface healthier except for [name] small probing site going into the muscle layer superiorly. 2.4  cm of tunneling in this area ooDTI on his left heel we have only been offloading. Looks better than last week no threatened open no evidence of infection oothe wound on the dorsal aspect of the first second toe on the left continues to look like it's regressing we have only been using silver alginate and terbinafine orally ooThe area in the first second toe web space on the right also looks to be [name] lot better using silver alginate and terbinafine I think this was prompted by tinea pedis 01/31/18; the patient was hospitalized in [place] last week apparently for [name] complicated UTI. He was discharged on cefepime he does in and out catheterizations. In the hospital he was discovered [name] I don't mild elevation of AST and ALT and the terbinafine was stopped.predictably the pressure ulcer on s his buttock looks betterusing silver alginate. The area on the left lateral leg also is better using Hydrofera Blue. The area between the first and second toes on the left better. First and second toes on the right still substantial but better. Finally the DTI on the left heel has held together and looks like it's resolving 02/07/18-he is here in follow-up evaluation for multiple ulcerations. He has new injury to the lateral aspect of the last issue [name] pressure ulcer, he states this is from adhesive removal trauma. He states he has tried multiple adhesive products with no success. All other ulcers appear stable. The left heel DTI is resolving. We will continue with same treatment plan and follow-up next week. 02/14/18; follow-up for multiple areas. ooHe has [name] new area last week on the lateral  aspect of his pressure ulcer more over the posterior trochanter. The original pressure ulcer looks quite stable has healthy granulation. 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 Almost fully epithelialized on the original superior area using Hydrofera Blue ooDTI on the left heel has peeled off this week to reveal [name] small superficial wound under denuded skin and subcutaneous tissue ooBoth areas between the first and second toes look better including nothing open on the left 02/21/18; ooThe patient's wounds on his left ischial tuberosity and posterior left greater trochanter actually looked better. He has [name] large area of irritation around the area which I think is contact dermatitis. I am doubtful that this is fungal ooHis original wound on the left lateral calf continues to improve we have been using Hydrofera Blue ooThere is no open area in the left first second toe web space although there is [name] lot of thick callus ooThe DTI on the left heel required debridement today of necrotic surface eschar and subcutaneous tissue using silver alginate ooFinally the area on the right first second toe webspace continues to contract using silver alginate and ketoconazole 02/28/18 ooLeft ischial tuberosity wounds look better using silver alginate. ooOriginal wound on the left calf only has one small open area left using Hydrofera Blue ooDTI on the left heel required debridement mostly removing skin from around this wound surface. Using silver alginate ooThe areas on the right first/second toe web space using silver alginate and ketoconazole 03/08/18 on evaluation today patient appears to be doing decently well as best I can tell in regard to his wounds. This is the first time that I have seen him as he generally is followed by Dr. Dellia Nims. With that being said none of his wounds appear to be infected  he does have an area  where there is some skin covering what appears to be [name] new wound on the left dorsal surface of his great toe. This is right at the nail bed. With that being said I do believe that debrided away some of the excess skin can be of benefit in this regard. Otherwise he has been tolerating the dressing changes without complication. 03/14/18; patient arrives today with the multiplicity of wounds that we are following. He has not been systemically unwell ooOriginal wound on the left lateral calf now only has 2 small open areas we've been using Hydrofera Blue which should continue ooThe deep tissue injury on the left heel requires debridement today. We've been using silver alginate ooThe left first second toe and the right first second toe are both are reminiscence what I think was tinea pedis. Apparently some of the callus Surface between the toes was removed last week when it started draining. ooPurulent drainage coming from the wound on the ischial tuberosity on the left. 03/21/18-He is here in follow-up evaluation for multiple wounds. 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 He tolerated debridement. The only change to last week's recommendations is to discontinue antifungal cream between toes. He will follow-up next week 03/28/18; following up for multiple wounds;Concern this week is streaking redness and swelling in the right foot. He is going to need antibiotics for this. 03/31/18; follow-up for right foot cellulitis. Streaking redness and swelling in the right foot on 03/28/18. 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 I put him on clindamycin 300 mg every 6 and brought him in for [name] quick check. He has an open wound between his first and second toes on the right foot as [name] potential source. 04/04/18; ooRight foot cellulitis is resolving he is completing clindamycin. This is truly good  news ooLeft lateral calf wound which is initial wound only has one small open area inferiorly this is close to healing out. He has compression stockings. We will use Hydrofera Blue right down to the epithelialization of this ooNonviable surface on the left heel which was initially pressure with [name] DTI. We've been using Hydrofera Blue. I'[name] going to switch this back to silver alginate ooLeft first second toe/tinea pedis this looks better using silver alginate ooRight first second toe tinea pedis using silver alginate ooLarge pressure ulcers on theLeft ischial tuberosity. Small wound here Looks better. I am uncertain about the surface over the large wound. Using silver alginate 04/11/18; ooCellulitis in the right foot is resolved ooLeft lateral calf wound which was his original wounds still has 2 tiny open areas remaining this is just about closed ooNonviable surface on the left heel is better but still requires debridement ooLeft first second toe/tinea pedis still open using silver alginate ooRight first second toe wound tinea pedis I asked him to go back to using ketoconazole and silver alginate ooLarge pressure ulcers on the left ischial tuberosity this shear injury here is resolved. Wound is smaller. No evidence of infection using silver alginate 04/18/18; ooPatient arrives with an intense area of cellulitis in the right mid lower calf extending into the right heel area. Bright red and warm. Smaller area on the left anterior leg. He has [name] significant history of MRSA. He will definitely need antibioticsoodoxycycline ooHe now has 2 open areas on the left ischial tuberosity the original large wound and now [name] satellite area which I think was above his initial satellite areas. Not [name] wonderful surface on  this satellite area surrounding erythema which looks like pressure related. ooHis left lateral calf wound again his original wound is just about closed ooLeft heel pressure injury still  requiring debridement ooLeft first second toe looks [name] lot better using silver alginate ooRight first second toe also using silver alginate and ketoconazole cream also looks better 04/20/18; the patient was worked in early today out of concerns with his cellulitis on the right leg. I had started him on doxycycline. This was 2 days ago. His wife was concerned about the swelling in the area. Also concerned about the left buttock. He has not been systemically unwell no fever chills. No nausea vomiting or diarrhea 04/25/18; the patient's left buttock wound is continued to deteriorate he is using Hydrofera Blue. He is still completing clindamycin for the cellulitis on the right leg although all of this looks better. 05/02/18 ooLeft buttock wound still with [name] lot of drainage and [name] very tightly adherent fibrinous necrotic surface. He has [name] deeper area superiorly ooThe left lateral calf wound is still closed ooDTI wound on the left heel necrotic surface especially the circumference using Iodoflex ooAreas between his left first second toe and right first second toe both look better. Dorsally and the right first second toe he had [name] necrotic surface although at smaller. In using silver alginate and ketoconazole. I did [name] culture last week which was [name] deep tissue culture of the reminiscence of the open wound on the right first second toe dorsally. This grew [name] few Acinetobacter and [name] few methicillin-resistant staph aureus. Nevertheless the area actually this week looked better. I didn't feel the need to specifically address this at least in terms of systemic antibiotics. 05/09/18; wounds are measuring larger more drainage per our intake. We are using Santyl covered with alginate on the large superficial buttock wounds, Iodosorb on the left heel, ketoconazole and silver alginate to the dorsal first and second toes bilaterally. 05/16/18; ooThe area on his left buttock better in some aspects although the area  superiorly over the ischial tuberosity required an extensive debridement.using Santyl ooLeft heel appears stable. Using Iodoflex ooThe areas between his first and second toes are not bad however there is spreading erythema up the dorsal aspect of his left foot this looks like cellulitis again. He is insensate the erythema is really very brilliant.o Erysipelas He went to see an allergist days ago because he was itching part of this he had lab work done. This showed [name] white count of 15.1 with 70% neutrophils. Hemoglobin of 11.4 and [name] platelet count of 659,000. Last white count we had in [facility] was [name] 2-1/2 years ago which was 25.9 but he was ill at the time. He was able to show me some lab work that was done by his primary physician the pattern is about the same. 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 But prompted me to go ahead and do x-rays of both feet and the pelvis rule out osteomyelitis. He also had [name] comprehensive metabolic panel this was reasonably normal his albumin was 3.7 liver function tests BUN/creatinine all normal 05/23/18; x-rays of both his feet from last week were negative for underlying pulmonary abnormality. The x-ray of his pelvis however showed mild irregularity in the left ischial which may represent some early osteomyelitis. The wound in the left ischial continues to get deeper clearly now exposed muscle. Each week necrotic surface material over this area. Whereas the rest of the wounds  do not look so bad. ooThe left ischial wound we have been using Santyl and calcium alginate ooT the left heel surface necrotic debris using Iodoflex o ooThe left lateral leg is still healed ooAreas on the left dorsal foot and the right dorsal foot are about the same. There is some inflammation on the left which might represent contact dermatitis, fungal dermatitis I am doubtful cellulitis although this looks better than last week 05/30/18; CT scan  done at Hospital did not show any osteomyelitis or abscess. Suggested the possibility of underlying cellulitis although I don't see [name] lot of evidence of this at the bedside ooThe wound itself on the left buttock/upper thigh actually looks somewhat better. No debridement ooLeft heel also looks better no debridement continue Iodoflex ooBoth dorsal first second toe spaces appear better using Lotrisone. Left still required debridement 06/06/18; ooIntake reported some purulent looking drainage from the left gluteal wound. Using Santyl and calcium alginate ooLeft heel looks better although still [name] nonviable surface requiring debridement ooThe left dorsal foot first/second webspace actually expanding and somewhat deeper. I may consider doing [name] shave biopsy of this area ooRight dorsal foot first/second webspace appears stable to improved. Using Lotrisone and silver alginate to both these areas 06/13/18 ooLeft gluteal surface looks better. Now separated in the 2 wounds. No debridement required. Still drainage. We'll continue silver alginate ooLeft heel continues to look better with Iodoflex continue this for at least another week ooOf his dorsal foot wounds the area on the left still has some depth although it looks better than last week. We've been using Lotrisone and silver alginate 06/20/18 ooLeft gluteal continues to look better healthy tissue ooLeft heel continues to look better healthy granulation wound is smaller. He is using Iodoflex and his long as this continues continue the Iodoflex ooDorsal right foot looks better unfortunately dorsal left foot does not. There is swelling and erythema of his forefoot. He had minor trauma to this several days ago but doesn't think this was enough to have caused any tissue injury. Foot looks like cellulitis, we have had this problem before 06/27/18 on evaluation today patient appears to be doing [name] little worse in regard to his foot ulcer. 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 Both of which would really be his only options for treating this infection. In the past he has been given and effusion of Orbactiv. This is done very well for him in the past again it's one time dosing IV antibiotic therapy. Subsequently I do believe this is something we're gonna need to see about doing at this point in time. Currently his other wounds seem to be doing somewhat better in my pinion I'[name] pretty happy in that regard. 07/03/18 on evaluation today patient's wounds actually appear to be doing fairly well. He has been tolerating the dressing changes without complication. All in all he seems to be showing signs of improvement. In regard to the antibiotics he has been dealing with infectious disease since I saw him last week as far as getting this scheduled. In the end [facility]'s [facility] this coming Friday. In the meantime he has been continuing to perform the dressing changes in such as previous. There does not appear to be any evidence of infection worsengin at this time. 07/10/18; ooSince I last  saw this man 2 weeks ago things have actually improved. IV antibiotics of resulted in less forefoot erythema although there is still some present. He is not systemically unwell ooLeft buttock wounds o2 now have no depth there is increased epithelialization Using silver alginate ooLeft heel still requires debridement using Iodoflex ooLeft dorsal foot still with [name] sizable wound about the size of [name] border but healthy granulation ooRight dorsal foot still with [name] slitlike area using silver alginate 07/18/18; the patient's cellulitis in the left foot is improved in fact I think it is on its way to resolving. ooLeft buttock wounds o2 both look better although the larger one has  hypertension granulation we've been using silver alginate ooLeft heel has some thick circumferential redundant skin over the wound edge which will need to be removed today we've been using Iodoflex ooLeft dorsal foot is still [name] sizable wound required debridement using silver alginate ooThe right dorsal foot is just about closed only [name] small open area remains here 07/25/18; left foot cellulitis is resolved ooLeft buttock wounds o2 both look better. Hyper-granulation on the major area ooLeft heel as some debris over the surface but otherwise looks [name] healthier wound. Using silver collagen ooRight dorsal foot is just about closed 07/31/18; arrives with our intake nurse worried about purulent drainage from the buttock. We had hyper-granulation here last week ooHis buttock wounds o2 continue to look better ooLeft heel some debris over the surface but measuring smaller. ooRight dorsal foot unfortunately has openings between the toes ooLeft foot superficial wound looks less aggravated. 08/07/18 ooButtock wounds continue to look better although some of her granulation and the larger medial wound. silver alginate ooLeft heel continues to look [name] lot better.silver collagen ooLeft foot superficial wound looks less stable. Requires debridement. He has [name] new wound superficial area on the foot on the lateral dorsal foot. ooRight foot looks better using silver alginate without Lotrisone 08/14/2018; patient was in the ER last week diagnosed with [name] UTI. He is now on Cefpodoxime and Macrodantin. ooButtock wounds continued to be smaller. Using silver alginate ooLeft heel continues to look better using silver collagen ooLeft foot superficial wound looks as though it is improving ooRight dorsal foot area is just about healed. 08/21/2018; patient is completed his antibiotics for his UTI. ooHe has 2 open areas on the buttocks. There is still not closed although the surface looks satisfactory. Using  silver alginate ooLeft heel continues to improve using silver collagen ooThe bilateral dorsal foot areas which are at the base of his first and second toes/possible tinea pedis are actually stable on the left but worse on the right. The area on the left required debridement of necrotic surface. After debridement I obtained [name] specimen for PCR culture. ooThe right dorsal foot which is been just about healed last week is now reopened 08/28/2018; culture done on the left dorsal foot showed coag negative staph both staph epidermidis and Lugdunensis. I think this is worthwhile initiating systemic treatment. I will use doxycycline given his long list of allergies. The area on the left heel slightly improved but still requiring debridement. ooThe large wound on the buttock is just about closed whereas the smaller one is larger. Using silver alginate in this area 09/04/2018; patient is completing his doxycycline for the left foot although this continues to be [name] very difficult wound area with very adherent necrotic debris. We are using silver alginate to all his wounds right foot left foot and the small wounds on his buttock, silver collagen  on the left heel. 09/11/2018; once again this patient has intense erythema and swelling of the left forefoot. Lesser degrees of erythema in the right foot. He has [name] long list of allergies and intolerances. I will reinstitute doxycycline. 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 Using silver alginate ooLeft heel also looks better using silver collagen ooUnfortunately both the areas on his feet look worse. The area on the left first second webspace is now gone through to the plantar part of his foot. The area on the left foot anteriorly is irritated with erythema and swelling in the forefoot. 09/25/2018 ooHis wound on the left plantar heel looks better. Using silver collagen ooThe area on the left buttock 2 small  remnant areas. One is closed one is still open. Using silver alginate ooThe areas between both his first and second toes look worse. This in spite of long-standing antifungal therapy with ketoconazole and silver alginate which should have antifungal activity ooHe has small areas around his original wound on the left calf one is on the bottom of the original scar tissue and one superiorly both of these are small and superficial but again given wound history in this site this is worrisome 10/02/2018 ooLeft plantar heel continues to gradually contract using silver collagen ooLeft buttock wound is unchanged using silver alginate ooThe areas on his dorsal feet between his first and second toes bilaterally look about the same. 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 Small open area here. We have been using silver alginate 10/09/2018; ooLeft plantar heel continues to look healthy and contract. No debridement is required ooLeft buttock slightly smaller with [name] tape injury wound just below which was new this week ooDorsal feet somewhat improved I have been using clindamycin ooLeft lateral looks lower extremity the actual open area looks worse although [name] lot of this is epithelialized. I am going to change to silver collagen today He has [name] lot more swelling in the right leg although this is not pitting not red and not particularly warm there is [name] lot of spasm in the right leg usually indicative of people with paralysis of some underlying discomfort. We have reviewed his vascular status from 2017 he had [name] left greater saphenous vein ablation. I wonder about referring him back to vascular surgery if the area on the left leg continues to deteriorate. 10/16/2018 in today for follow-up and management of multiple lower  extremity ulcers. His left Buttock wound is much lower smaller and almost closed completely. The wound to the left ankle has began to reopen with Epithelialization and some adherent slough. He has multiple new areas to the left foot and leg. The left dorsal foot without much improvement. Wound present between left great webspace and 2nd toe. Erythema and edema present right leg. Right LE ultrasound obtained on 10/10/18 was negative for DVT . 10/23/2018; ooLeft buttock is closed over. Still dry macerated skin but there is no open wound. I suspect this is chronic pressure/moisture ooLeft lateral calf is quite [name] bit worse than when I saw this last. There is clearly drainage here he has macerated skin into the left plantar heel. We will change the primary dressing to alginate ooLeft dorsal foot has some improvement in overall wound area. Still using clindamycin and silver alginate ooRight  dorsal foot about the same as the left using clindamycin and silver alginate ooThe erythema in the right leg has resolved. He is DVT rule out was negative ooLeft heel pressure area required debridement although the wound is smaller and the surface is health 10/26/2018 ooThe patient came back in for his nurse check today predominantly because of the drainage coming out of the left lateral leg with [name] recent reopening of his original wound on the left lateral calf. He comes in today with [name] large amount of surrounding erythema around the wound extending from the calf into the ankle and even in the area on the dorsal foot. He is not systemically unwell. He is not febrile. Nevertheless this looks like cellulitis. We have been using silver alginate to the area. I changed him to [name] regular visit and I am going to prescribe him doxycycline. The rationale here is [name] long list of medication intolerances and [name] history of MRSA. I did not see anything that I thought would provide [name] valuable culture 10/30/2018 ooFollow-up from  his appointment 4 days ago with really an extensive area of cellulitis in the left calf left lateral ankle and left dorsal foot. I put him on doxycycline. He has [name] long list of medication allergies which are true allergy reactions. 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 In any case he is [name] lot better today. The erythema especially in the anterior and lateral left calf is better. He still has left ankle erythema. He also is complaining about increasing edema in the right leg we have only been using Kerlix Coban and he has been doing the wraps at home. Finally he has [name] spotty rash on the medial part of his upper left calf which looks like folliculitis or perhaps wrap occlusion type injury. Small superficial macules not pustules 11/06/18 patient arrives today with again [name] considerable degree of erythema around the wound on the left lateral calf extending into the dorsal ankle and dorsal foot. This is [name] lot worse than when I saw this last week. He is on doxycycline really with not [name] lot of improvement. He has not been systemically unwell Wounds on the; left heel actually looks improved. Original area on the left foot and proximity to the first and second toes looks about the same. He has superficial areas on the dorsal foot, anterior calf and then the reopening of his original wound on the left lateral calf which looks about the same ooThe only area he has on the right is the dorsal webspace first and second which is smaller. ooHe has [name] large area of dry erythematous skin on the left buttock small open area here. 11/13/2018; the patient arrives in much better condition. The erythema around the wound on the left lateral calf is [name] lot better. Not sure whether this was the clindamycin or the TCA and ketoconazole or just in the improvement in edema control [stasis dermatitis]. In any case this is [name] lot better. The area on the left heel  is very small and just about resolved using silver collagen we have been using silver alginate to the areas on his dorsal feet 11/20/2018; his wounds include the left lateral calf, left heel, dorsal aspects of both feet just proximal to the first second webspace. He is stable to slightly improved. I did not think any changes to his dressings were going to be necessary 11/27/2018 he has [name] reopening on the left  buttock which is surrounded by what looks like tinea or perhaps some other form of dermatitis. The area on the left dorsal foot has some erythema around it I have marked this area but I am not sure whether this is cellulitis or not. Left heel is not closed. Left calf the reopening is really slightly longer and probably worse 1/13; in general things look better and smaller except for the left dorsal foot. Area on the left heel is just about closed, left buttock looks better only [name] small wound remains in the skin looks better [using Lotrisone] 1/20; the area on the left heel only has [name] few remaining open areas here. Left lateral calf about the same in terms of size, left dorsal foot slightly larger right lateral foot still not closed. The area on the left buttock has no open wound and the surrounding skin looks [name] lot better 1/27; the area on the left heel is closed. Left lateral calf better but still requiring extensive debridements. The area on his left buttock is closed. He still has the open areas on the left dorsal foot which is slightly smaller in the right foot which is slightly expanded. We have been using Iodoflex on these areas as well 2/3; left heel is closed. Left lateral calf still requiring debridement using Iodoflex there is no open area on his left buttock however he has dry scaly skin over [name] large area of this. Not really responding well to the Lotrisone. Finally the areas on his dorsal feet at the level of the first second webspace are slightly smaller on the right and about the same  on the left. Both of these vigorously debrided with Anasept and gauze 2/10; left heel remains closed he has dry erythematous skin over the left buttock but there is no open wound here. Left lateral leg has come in and with. Still requiring debridement we have been using Iodoflex here. Finally the area on the left dorsal foot and right dorsal foot are really about the same extremely dry callused fissured areas. He does not yet have [name] dermatology appointment 2/17; left heel remains closed. He has [name] new open area on the left buttock. The area on the left lateral calf is bigger longer and still covered in necrotic debris. No major change in his foot areas bilaterally. I am awaiting for [name] dermatologist to look on this. We have been using ketoconazole I do not know that this is been doing any good at all. 2/24; left heel remains closed. The left buttock wound that was new reopening last week looks better. The left lateral calf appears better also although still requires debridement. The major area on his foot is the left first second also requiring debridement. We have been putting Prisma on all wounds. I do not believe that the ketoconazole has done too much good for his feet. He will use Lotrisone I am going to give him [name] 2-week course of terbinafine. We still do not have [name] dermatology appointment 3/2 left heel remains closed however there is skin over bone in this area I pointed this out to him today. The left buttock wound is epithelialized but still does not look completely stable. The area on the left leg required debridement were using silver collagen here. With regards to his feet we changed to Lotrisone last week and silver alginate. 3/9; left heel remains closed. Left buttock remains closed. The area on the right foot is essentially closed. The left foot remains unchanged. Slightly smaller  on the left lateral calf. Using silver collagen to both of these areas 3/16-Left heel remains closed. Area  on right foot is closed. Left lateral calf above the lateral malleolus open wound requiring debridement with easy bleeding. Left dorsal wound proximal to first toe also debrided. Left ischial area open new. Patient has been using Prisma with wrapping every 3 days. Dermatology appointment is apparently tomorrow.Patient has completed his terbinafine 2-week course with some apparent improvement according to him, there is still flaking and dry skin in his foot on the left 3/23; area on the right foot is reopened. The area on the left anterior foot is about the same still [name] very necrotic adherent surface. He still has the area on the left leg and reopening is on the left buttock. He apparently saw dermatology although I do not have [name] note. According to the patient who is usually fairly well informed they did not have any good ideas. Put him on oral terbinafine which she is been on before. 3/30; using silver collagen to all wounds. Apparently his dermatologist put him on doxycycline and rifampin presumably some culture grew staph. I do not have this result. He remains on terbinafine although I have used terbinafine on him before 4/6; patient has had [name] fairly substantial reopening on the right foot between the first and second toes. He is finished his terbinafine and I believe is on doxycycline and rifampin still as prescribed by dermatology. We have been using silver collagen to all his wounds although the patient reports that he thinks silver alginate does better on the wounds on his buttock. 4/13; the area on his left lateral calf about the same size but it did not require debridement. ooLeft dorsal foot just proximal to the webspace between the first and second toes is about the same. Still nonviable surface. I note some superficial bronze discoloration of the dorsal part of his foot ooRight dorsal foot just proximal to the first and second toes also looks about the same. I still think there may be  the same discoloration I noted above on the left ooLeft buttock wound looks about the same 4/20; left lateral calf appears to be gradually contracting using silver collagen. 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 The left dorsal foot wound is debrided of tightly adherent necrotic debris and really cleans up quite nicely. The right area is worse with expansion. I did not debride this it is now over the base of the second toe ooThe area on his left buttock is smaller no debridement is required using silver collagen 5/4; left calf continues to make good progress. ooHe arrives with erythema around the wounds on his dorsal foot which even extends to the plantar aspect. Very concerning for coexistent infection. He is finished the erythromycin I gave him for possible erythrasma this does not seem to have helped. ooThe area on the left foot is about the same base of the dorsal toes ooIs area on the buttock looks improved on the left 5/11; left calf and left buttock continued to make good progress. Left foot is about the same to slightly improved. ooMajor problem is on the right foot. He has not had an x-ray. Deep tissue culture I did last week showed both Enterobacter and E. coli. I did not change the doxycycline I put him on empirically although neither 1 of these were plated to doxycycline. He arrives today with the erythema looking worse on both the dorsal and plantar  foot. Macerated skin on the bottom of the foot. he has not been systemically unwell 5/18-Patient returns at 1 week, left calf wound appears to be making some progress, left buttock wound appears slightly worse than last time, left foot wound looks slightly better, right foot redness is marginally better. X-ray of both feet show no air or evidence of osteomyelitis. Patient is finished his Omnicef and terbinafine. He continues to have macerated skin on the bottom of the  left foot as well as right 5/26; left calf wound is better, left buttock wound appears to have multiple small superficial open areas with surrounding macerated skin. X-rays that I did last time showed no evidence of osteomyelitis in either foot. He is finished cefdinir and doxycycline. I do not think that he was on terbinafine. He continues to have [name] large superficial open area on the right foot anterior dorsal and slightly between the first and second toes. I did send him to dermatology 2 months ago or so wondering about whether they would do [name] fungal scraping. I do not believe they did but did do [name] culture. We have been using silver alginate to the toe areas, he has been using antifungals at home topically either ketoconazole or Lotrisone. We are using silver collagen on the left foot, silver alginate on the right, silver collagen on the left lateral leg and silver alginate on the left buttock 6/1; left buttock area is healed. We have the left dorsal foot, left lateral leg and right dorsal foot. We are using silver alginate to the areas on both feet and silver collagen to the area on his left lateral calf 6/8; the left buttock apparently reopened late last week. He is not really sure how this happened. He is tolerating the terbinafine. Using silver alginate to all wounds 6/15; left buttock wound is larger than last week but still superficial. ooCame in the clinic today with [name] report of purulence from the left lateral leg I did not identify any infection ooBoth areas on his dorsal feet appear to be better. He is tolerating the terbinafine. Using silver alginate to all wounds 6/22; left buttock is about the same this week, left calf quite [name] bit better. His left foot is about the same however he comes in with erythema and warmth in the right forefoot once again. Culture that I gave him in the beginning of May showed Enterobacter and E. coli. I gave him doxycycline and things seem to improve although  neither 1 of these organisms was specifically plated. 6/29; left buttock is larger and dry this week. Left lateral calf looks to me to be improved. Left dorsal foot also somewhat improved right foot completely unchanged. The erythema on the right foot is still present. He is completing the Ceftin dinner that I gave him empirically [see discussion above.) 7/6 - All wounds look to be stable and perhaps improved, the left buttock wound is slightly smaller, per patient bleeds easily, completed ceftin, the right foot redness is less, he is on terbinafine 7/13; left buttock wound about the same perhaps slightly narrower. Area on the left lateral leg continues to narrow. Left dorsal foot slightly smaller right foot about the same. We are using silver alginate on the right foot and Hydrofera Blue to the areas on the left. Unna boot on the left 2 layer compression on the right 7/20; left buttock wound absolutely the same. Area on lateral leg continues to get better. Left dorsal foot require debridement as did the right  no major change in the 7/27; left buttock wound the same size necrotic debris over the surface. The area on the lateral leg is closed once again. His left foot looks better right foot about the same although there is some involvement now of the posterior first second toe area. He is still on terbinafine which I have given him for [name] month, not certain [name] centimeter major change 06/25/19-All wounds appear to be slightly improved according to report, left buttock wound looks clean, both foot wounds have minimal to no debris the right dorsal foot has minimal slough. We are using Hydrofera Blue to the left and silver alginate to the right foot and ischial wound. 8/10-Wounds all appear to be around the same, the right forefoot distal part has some redness which was not there before, however the wound looks clean and small. Ischial wound looks about the same with no changes 8/17; his wound on the left  lateral calf which was his original chronic venous insufficiency wound remains closed. Since I last saw him the areas on the left dorsal foot right dorsal foot generally appear better but require debridement. The area on his left initial tuberosity appears somewhat larger to me perhaps hyper granulated and bleeds very easily. We have been using Hydrofera Blue to the left dorsal foot and silver alginate to everything else 8/24; left lateral calf remains closed. The areas on his dorsal feet on the webspace of the first and second toes bilaterally both look better. The area on the left buttock which is the pressure ulcer stage II slightly smaller. I change the dressing to Hydrofera Blue to all areas 8/31; left lateral calf remains closed. The area on his dorsal feet bilaterally look better. Using Hydrofera Blue. Still requiring debridement on the left foot. No change in the left buttock pressure ulcers however 9/14; left lateral calf remains closed. Dorsal feet look quite [name] bit better than 2 weeks ago. Flaking dry skin also [name] lot better with the ammonium lactate I gave him 2 weeks ago. The area on the left buttock is improved. He states that his Roho cushion developed [name] leak and he is getting [name] new one, in the interim he is offloading this vigorously 9/21; left calf remains closed. Left heel which was [name] possible DTI looks better this week. He had macerated tissue around the left dorsal foot right foot looks satisfactory and improved left buttock wound. I changed his dressings to his feet to silver alginate bilaterally. Continuing Hydrofera Blue on the left buttock. 9/28 left calf remains closed. Left heel did not develop anything [possible DTI] dry flaking skin on the left dorsal foot. Right foot looks satisfactory. Improved left buttock wound. We are using silver alginate on his feet Hydrofera Blue on the buttock. I have asked him to go back to the Lotrisone on his feet including the wounds and  surrounding areas 10/5; left calf remains closed. The areas on the left and right feet about the same. [name] lot of this is epithelialized however debris over the remaining open areas. He is using Lotrisone and silver alginate. The area on the left buttock using Hydrofera Blue 10/26. Patient has been out for 3 weeks secondary to Covid concerns. He tested negative but I think his wife tested positive. He comes in today with the left foot substantially worse, right foot about the same. Even more concerning he states that the area on his left buttock closed over but then reopened and is considerably deeper in one aspect  than it was before [stage III wound] 11/2; left foot really about the same as last week. Quarter sized wound on the dorsal foot just proximal to the first second toes. Surrounding erythema with areas of denuded epithelium. This is not really much different looking. Did not look like cellulitis this time however. ooRight foot area about the same.. We have been using silver alginate alginate on his toes ooLeft buttock still substantial irritated skin around the wound which I think looks somewhat better. We have been using Hydrofera Blue here. 11/9; left foot larger than last week and [name] very necrotic surface. Right foot I think is about the same perhaps slightly smaller. Debris around the circumference also addressed. Unfortunately on the left buttock there is been [name] decline. Satellite lesions below the major wound distally and now [name] an additional one posteriorly we have been using Hydrofera Blue but I think this is [name] pressure issue 11/16; left foot ulcer dorsally again [name] very adherent necrotic surface. Right foot is about the same. Not much change in the pressure ulcer on his left buttock. 11/30; left foot ulcer dorsally basically the same as when I saw him 2 weeks ago. Very adherent fibrinous debris on the wound surface. Patient reports [name] lot of drainage as well. The character of this wound  has changed completely although it has always been refractory. We have been using Iodoflex, patient changed back to alginate because of the drainage. Area on his right dorsal foot really looks benign with [name] healthier surface certainly [name] lot better than on the left. Left buttock wounds all improved using Hydrofera Blue 12/7; left dorsal foot again no improvement. Tightly adherent debris. PCR culture I did last week only showed likely skin contaminant. I have gone ahead and done [name] punch biopsy of this which is about the last thing in terms of investigations I can think to do. He has known venous insufficiency and venous hypertension and this could be the issue here. The area on the right foot is about the same left buttock slightly worse according to our intake nurse secondary to [facility] Blue sticking to the wound 12/14; biopsy of the left foot that I did last time showed changes that could be related to wound healing/chronic stasis dermatitis phenomenon no neoplasm. We have been using silver alginate to both feet. I change the one on the left today to Sorbact and silver alginate to his other 2 wounds 12/28; the patient arrives with the following problems; ooMajor issue is the dorsal left foot which continues to be [name] larger deeper wound area. Still with [name] completely nonviable surface ooParadoxically the area mirror image on the right on the right dorsal foot appears to be getting better. ooHe had some loss of dry denuded skin from the lower part of his original wound on the left lateral calf. Some of this area looked [name] little vulnerable and for this reason we put him in wrap that on this side this week ooThe area on his left buttock is larger. He still has the erythematous circular area which I think is [name] combination of pressure, sweat. This does not look like cellulitis or fungal dermatitis 11/26/2019; -Dorsal left foot large open wound with depth. Still debris over the surface. Using  Sorbact ooThe area on the dorsal right foot paradoxically has closed over [facility] has [name] reopening on the left ankle laterally at the base of his original wound that extended up into the calf. This appears clean. ooThe left buttock wound is  smaller but with very adherent necrotic debris over the surface. We have been using silver alginate here as well The patient had arterial studies done in 2017. He had biphasic waveforms at the dorsalis pedis and posterior tibial bilaterally. ABI in the left was 1.17. Digit waveforms were dampened. He has slight spasticity in the great toes I do not think [name] TBI would be possible 1/11; the patient comes in today with [name] sizable reopening between the first and second toes on the right. This is not exactly in the same location where we have been treating wounds previously. According to our intake nurse this was actually fairly deep but 0.6 cm. The area on the left dorsal foot looks about the same the surface is somewhat cleaner using Sorbact, his MRI is in 2 days. We have not managed yet to get arterial studies. The new reopening on the left lateral calf looks somewhat better using alginate. The left buttock wound is about the same using alginate 1/18; the patient had his ARTERIAL studies which were quite normal. ABI in the right at 1.13 with triphasic/biphasic waveforms on the left ABI 1.06 again with triphasic/biphasic waveforms. It would not have been possible to have done [name] toe brachial index because of spasticity. We have been using Sorbac to the left foot alginate to the rest of his wounds on the right foot left lateral calf and left buttock 1/25; arrives in clinic with erythema and swelling of the left forefoot worse over the first MTP area. This extends laterally dorsally and but also posteriorly. Still has an area on the left lateral part of the lower part of his calf wound it is eschared and clearly not closed. ooArea on the left buttock still with surrounding  irritation and erythema. ooRight foot surface wound dorsally. The area between the right and first and second toes appears better. 2/1; ooThe left foot wound is about the same. Erythema slightly better I gave him [name] week of doxycycline empirically ooRight foot wound is more extensive extending between the toes to the plantar surface ooLeft lateral calf really no open surface on the inferior part of his original wound however the entire area still looks vulnerable ooAbsolutely no improvement in the left buttock wound required debridement. 2/8; the left foot is about the same. Erythema is slightly improved I gave him clindamycin last week. ooRight foot looks better he is using Lotrimin and silver alginate ooHe has [name] breakdown in the left lateral calf. Denuded epithelium which I have removed ooLeft buttock about the same were using Hydrofera Blue 2/15; left foot is about the same there is less surrounding erythema. Surface still has tightly adherent debris which I have debriding however not making any progress ooRight foot has [name] substantial wound on the medial right second toe between the first and second webspace. ooStill an open area on the left lateral calf distal area. ooButtock wound is about the same 2/22; left foot is about the same less surrounding erythema. Surface has adherent debris. Polymen Ag Right foot area significant wound between the first and second toes. We have been using silver alginate here Left lateral leg polymen Ag at the base of his original venous insufficiency wound ooLeft buttock some improvement here 3/1; ooRight foot is deteriorating in the first second toe webspace. Larger and more substantial. We have been using silver alginate. ooLeft dorsal foot about the same markedly adherent surface debris using PolyMem Ag ooLeft lateral calf surface debris using PolyMem AG ooLeft buttock is improved again  using PolyMem Ag. ooHe is completing his  terbinafine. The erythema in the foot seems better. He has been on this for 2 weeks 3/8; no improvement in any wound area in fact he has [name] small open area on the dorsal midfoot which is new this week. He has not gotten his foot x-rays yet 3/15; his x-rays were both negative for osteomyelitis of both feet. No major change in any of his wounds on the extremities however his buttock wounds are better. We have been using polymen on the buttocks, left lower leg. Iodoflex on the left foot and silver alginate on the right 3/22; arrives in clinic today with the 2 major issues are the improvement in the left dorsal foot wound which for once actually looks healthy with [name] nice healthy wound surface without debridement. Using Iodoflex here. Unfortunately on the left lateral calf which is in the distal part of his original wound he came to the clinic here for there was purulent drainage noted some increased breakdown scattered around the original area and [name] small area proximally. We we are using polymen here will change to silver alginate today. His buttock wound on the left is better and I think the area on the right first second toe webspace is also improved 3/29; left dorsal foot looks better. Using Iodoflex. Left ankle culture from deterioration last time grew E. coli, Enterobacter and Enterococcus. I will give him [name] course of cefdinir although that will not cover Enterococcus. The area on the right foot in the webspace of the first and second toe lateral first toe looks better. The area on his buttock is about healed Vascular appointment is on April 21. This is to look at his venous system vis--vis continued breakdown of the wounds on the left including the left lateral leg and left dorsal foot he. He has had previous ablations on this side 4/5; the area between the right first and second toes lateral aspect of the first toe looks better. Dorsal aspect of the left first toe on the left foot also improved.  Unfortunately the left lateral lower leg is larger and there is [name] second satellite wound superiorly. The usual superficial abrasions on the left buttock overall better but certainly not closed 4/12; the area between the right first and second toes is improved. Dorsal aspect of the left foot also slightly smaller with [name] vibrant healthy looking surface. No real change in the left lateral leg and the left buttock wound is healed He has an unaffordable co-pay for Apl[employer]raf. Appointment with vein and vascular with regards to the left leg venous part of the circulation is on 4/21 4/19; we continue to see improvement in all wound areas. Although this is minor. He has his vascular appointment on 4/21. The area on the left buttock has not reopened although right in the center of this area the skin looks somewhat threatened 4/26; the left buttock is unfortunately reopened. In general his left dorsal foot has [name] healthy surface and looks somewhat smaller although it was not measured as such. The area between his first and second toe webspace on the right as [name] small wound against the first toe. The patient saw vascular surgery. The real question I was asking was about the small saphenous vein on the left. He has previously ablated left greater saphenous vein. Nothing further was commented on on the left. Right greater saphenous vein without reflux at the saphenofemoral junction or proximal thigh there was no indication for ablation of the  right greater saphenous vein duplex was negative for DVT bilaterally. They did not think there was anything from [name] vascular surgery point of view that could be offered. They ABIs within normal limits 5/3; only small open area on the left buttock. The area on the left lateral leg which was his original venous reflux is now 2 wounds both which look clean. We are using Iodoflex on the left dorsal foot which looks healthy and smaller. He is down to [name] very tiny area between the right  first and second toes, using silver alginate 5/10; all of his wounds appear better. We have much better edema control in 4 layer compression on the left. This may be the factor that is allowing the left foot and left lateral calf to heal. He has external compression garments at home 04/14/20-All of his wounds are progressing well, the left forefoot is practically closed, left ischium appears to be about the same, right toe webspace is also smaller. The left lateral leg is about the same, continue using Hydrofera Blue to this, silver alginate to the ischium, Iodoflex to the toe space on the right 6/7; most of his wounds outside of the left buttock are doing well. The area on the left lateral calf and left dorsal foot are smaller. The area on the right foot in between the first and second toe webspace is barely visible although he still says there is some drainage here is the only reason I did not heal this out. ooUnfortunately the area on the left buttock almost looks like he has [name] skin tear from tape. He has open wound and then [name] large flap of skin that we are trying to get adherence over an area just next to the remaining wound 6/21; 2 week follow-up. I believe is been here for nurse visits. Miraculously the area between his first and second toes on the left dorsal foot is closed over. Still open on the right first second web space. The left lateral calf has 2 open areas. Distally this is more superficial. The proximal area had [name] little more depth and required debridement of adherent necrotic material. His buttock wound is actually larger we have been using silver alginate here 6/28; the patient's area on the left foot remains closed. Still open wet area between the first and second toes on the right and also extending into the plantar aspect. We have been using silver alginate in this location. He has 2 areas on the left lower leg part of his original long wounds which I think are better. We have  been using Hydrofera Blue here. Hydrofera Blue to the left buttock which is stable 7/12; left foot remains closed. Left ankle is closed. May be [name] small area between his right first and second toes the only truly open area is on the left buttock. We have been using Hydrofera Blue here 7/19; patient arrives with marked deterioration especially in the left foot and ankle. We did not put him in [name] compression wrap on the left last week in fact he wore his juxta lite stockings on either side although he does not have an underlying stocking. He has [name] reopening on the left dorsal foot, left lateral ankle and [name] new area on the right dorsal ankle. More worrisome is the degree of erythema on the left foot extending on the lateral foot into the lateral lower leg on the left 7/26; the patient had erythema and drainage from the lateral left ankle last week. Culture of  this grew MRSA resistant to doxycycline and clindamycin which are the 2 antibiotics we usually use with this patient who has multiple antibiotic allergies including linezolid, trimethoprim sulfamethoxazole. I had give him an empiric doxycycline and he comes in the area certainly looks somewhat better although it is blotchy in his lower leg. He has not been systemically unwell. He has had areas on the left dorsal foot which is [name] reopening, chronic wounds on the left lateral ankle. Both of these I think are secondary to chronic venous insufficiency. The area between his first and second toes is closed as far as I can tell. He had [name] new wrap injury on the right dorsal ankle last week. Finally he has an area on the left buttock. We have been using silver alginate to everything except the left buttock we are using Hydrofera Blue 06/30/20-Patient returns at 1 week, has been given [name] sample dose pack of NUZYRA which is [name] tetracycline derivative [omadacycline], patient has completed those, we have been using silver alginate to almost all the wounds except the left  ischium where we are using Hydrofera Blue all of them look better 8/16; since I last saw the patient he has been doing well. The area on the left buttock, left lateral ankle and left foot are all closed today. He has completed the [place] I gave him last time and tolerated this well. He still has open areas on the right dorsal ankle and in the right first second toe area which we are using silver alginate. 8/23; we put him in his bilateral external compression stockings last week as he did not have anything open on either leg except for concerning area between the right first and second toe. He comes in today with an area on the left dorsal foot slightly more proximal than the original wound, the left lateral foot but this is actually [name] continuation of the area he had on the left lateral ankle from last time. As well he is opened up on the left buttock again. 8/30; comes in today with things looking [name] lot better. The area on the left lower ankle has closed down as has the left foot but with eschar in both areas. The area on the dorsal right ankle is also epithelialized. Very little remaining of the left buttock wound. We have been using silver alginate on all wound areas 9/13; the area in the first second toe webspace on the right has fully epithelialized. He still has some vulnerable epithelium on the right and the ankle and the dorsal foot. He notes weeping. He is using his juxta lite stocking. On the left again the left dorsal foot is closed left lateral ankle is closed. We went to the juxta lite stocking here as well. ooStill vulnerable in the left buttock although only 2 small open areas remain here 9/27; 2-week follow-up. We did not look at his left leg but the patient says everything is closed. He is [name] bit disturbed by the amount of edema in his left foot he is using juxta lite stockings but asking about over the toes stockings which would be 30/40, will talk to him next time. According to him  there is no open wound on either the left foot or the left ankle/calf He has an open area on the dorsal right calf which I initially point [name] wrap injury. He has superficial remaining wound on the left ischial tuberosity been using silver alginate although he says this sticks to the wound 10/5;  we gave him 2-week follow-up but he called yesterday expressing some concerns about his right foot right ankle and the left buttock. He came in early. There is still no open areas on the left leg and that still in his juxta lite stocking 10/11; he only has 1 small area on the left buttock that remains measuring millimeters 1 mm. Still has the same irritated skin in this area. We recommended zinc oxide when this eventually closes and pressure relief is meticulously is he can do this. He still has an area on the dorsal part of his right first through third toes which is [name] bit irritated and still open and on the dorsal ankle near the crease of the ankle. We have been using silver alginate and using his own stocking. He has nothing open on the left leg or foot 10/25; 2-week follow-up. Not nearly as good on the left buttock as I was hoping. For open areas with 5 looking threatened small. He has the erythematous irritated chronic skin in this area. oo1 area on the right dorsal ankle. He reports this area bleeds easily ooRight dorsal foot just proximal to the base of his toes ooWe have been using silver alginate. 11/8; 2-week follow-up. Left buttock is about the same although I do not think the wounds are in the same location we have been using silver alginate. I have asked him to use zinc oxide on the skin around the wounds. ooHe still has [name] small area on the right dorsal ankle he reports this bleeds easily ooRight dorsal foot just proximal to the base of the toes does not have anything open although the skin is very dry and scaly ooHe has [name] new opening on the nailbed of the left great toe. Nothing on the  left ankle 11/29; 3-week follow-up. Left buttock has 2 open areas. And washing of these wounds today started bleeding easily. Suggesting very friable tissue. We have been using silver alginate. Right dorsal ankle which I thought was initially [name] wrap injury we have been using silver alginate. Nothing open between the toes that I can see. He states the area on the left dorsal toe nailbed healed after the last visit in 2 or 3 days 12/13; 3-week follow-up. His left buttock now has 3 open areas but the original 2 areas are smaller using polymen here. Surrounding skin looks better. The right dorsal ankle is closed. He has [name] small opening on the right dorsal foot at the level of the third toe. In general the skin looks better here. He is wearing his juxta lite stocking on the left leg says there is nothing open 11/24/2020; 3 weeks follow-up. His left buttock still has the 3 open areas. We have been using polymen but due to lack of response he changed to [facility] area. Surrounding skin is dry erythematous and irritated looking. There is no evidence of infection either bacterial or fungal however there is loss of surface epithelium ooHe still has very dry skin in his foot causing irritation and erythema on the dorsal part of his toes. This is not responded to prolonged courses of antifungal simply looks dry and irritated 1/24; left buttock area still looks about the same he was unable to find the triad ointment that we had suggested. The area on the right lower leg just above the dorsal ankle has reopened and the areas on the right foot between the first second and second third toes and scaling on the bottom of the foot has  been about the same for quite some time now. been using silver alginate to all wound areas 2/7; left buttock wound looked quite good although not much smaller in terms of surface area surrounding skin looks better. Only [name] few dry flaking areas on the right foot in between the first  and second toes the skin generally looks better here [ammonium lactate]. Finally the area on the right dorsal ankle is closed 2/21; ooThere is no open area on the right foot even between the right first and second toe. Skin around this area dorsally and plantar aspects look better. ooHe has [name] reopening of the area on the right ankle just above the crease of the ankle dorsally. I continue to think that this is probably friction from spasms may be even this time with his stocking under the compression stockings. ooWounds on his left buttock look about the same there [name] couple of areas that have reopened. He has [name] total square area of loss of epithelialization. This does not look like infection it looks like [name] contact dermatitis but I just cannot determine to what Objective Constitutional Sitting or standing Blood Pressure is within target range for patient.. Pulse regular and within target range for patient.Marland Kitchen Respirations regular, non-labored and within target range.. Temperature is normal and within the target range for the patient.Marland Kitchen Appears in no distress. Vitals Time Taken: 7:49 AM, Height: 70 in, Weight: 216 lbs, BMI: 31, Temperature: 98.2 F, Pulse: 125 bpm, Respiratory Rate: 18 breaths/min, Blood Pressure: 125/78 mmHg. Cardiovascular Pedal pulses are palpable on the right. General Notes: Wound exam; ooLeft buttock about the same as last time small superficial satellite areas. The entire area of erythema in [name] square distribution would suggest contact dermatitis. No obvious infection but there is epithelial loss ooRight dorsal ankle is [name] reopened think this looks like an abrasion possibly against his juxta lite stocking. He has very strong spasms in this foot and ankle. I think he may need to pad this area forever were going to apply silver alginate with [name] cover here. ooInterestingly nothing on the right first second toe that I could see. Integumentary (Hair, Skin) Wound #38R status is  Open. Original cause of wound was Gradually Appeared. The date acquired was: 11/30/2019. The wound has been in treatment 58 weeks. The wound is located on the Right T - Web between 1st and 2nd. The wound measures 0.2cm length x 0.2cm width x 0.1cm depth; 0.031cm^2 area and oe 0.003cm^3 volume. There is Fat Layer (Subcutaneous Tissue) exposed. There is no tunneling or undermining noted. There is [name] medium amount of serous drainage noted. The wound margin is flat and intact. There is large (67-100%) pink granulation within the wound bed. There is no necrotic tissue within the wound bed. Wound #41R status is Open. Original cause of wound was Gradually Appeared. The date acquired was: 03/16/2020. The wound has been in treatment 43 weeks. The wound is located on the Left Ischium. The wound measures 2.9cm length x 2.2cm width x 0.1cm depth; 5.011cm^2 area and 0.501cm^3 volume. There is Fat Layer (Subcutaneous Tissue) exposed. There is no tunneling or undermining noted. There is [name] medium amount of serosanguineous drainage noted. The wound margin is distinct with the outline attached to the wound base. There is large (67-100%) red, friable granulation within the wound bed. There is [name] small (1- 33%) amount of necrotic tissue within the wound bed including Adherent Slough. Wound #47 status is Healed - Epithelialized. Original cause of wound was Gradually Appeared.  The date acquired was: 11/02/2020. The wound has been in treatment 10 weeks. The wound is located on the Right,Dorsal Foot. The wound measures 0cm length x 0cm width x 0cm depth; 0cm^2 area and 0cm^3 volume. There is no tunneling or undermining noted. There is [name] none present amount of drainage noted. The wound margin is distinct with the outline attached to the wound base. There is no granulation within the wound bed. There is no necrotic tissue within the wound bed. Wound #49 status is Open. Original cause of wound was Shear/Friction. The date acquired  was: 01/12/2021. The wound is located on the Left,Proximal Ischium. The wound measures 2cm length x 1cm width x 0.1cm depth; 1.571cm^2 area and 0.157cm^3 volume. There is Fat Layer (Subcutaneous Tissue) exposed. There is no tunneling or undermining noted. There is [name] medium amount of serosanguineous drainage noted. The wound margin is distinct with the outline attached to the wound base. There is small (1-33%) red granulation within the wound bed. There is [name] large (67-100%) amount of necrotic tissue within the wound bed including Adherent Slough. Wound #50 status is Open. Original cause of wound was Gradually Appeared. The date acquired was: 01/12/2021. The wound is located on the Right,Dorsal Ankle. The wound measures 2cm length x 1.2cm width x 0.2cm depth; 1.885cm^2 area and 0.377cm^3 volume. There is Fat Layer (Subcutaneous Tissue) exposed. There is no tunneling or undermining noted. There is [name] medium amount of serosanguineous drainage noted. The wound margin is flat and intact. There is small (1-33%) pink granulation within the wound bed. There is [name] large (67-100%) amount of necrotic tissue within the wound bed including Eschar. 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 There was [name] Excisional Skin/Subcutaneous Tissue Debridement with [name] total area of 2.4 sq cm performed by Ricard Dillon., MD. With the following instrument(s): Curette to remove Non-Viable tissue/material. Material removed includes Eschar and Subcutaneous Tissue and. No specimens were taken. [name] time out was conducted at 08:16, prior  to the start of the procedure. [name] Minimum amount of bleeding was controlled with Pressure. The procedure was tolerated well with [name] pain level of 0 throughout and [name] pain level of 0 following the procedure. Post Debridement Measurements: 2cm length x 1.2cm width x 0.2cm depth; 0.377cm^3 volume. Character of Wound/Ulcer Post Debridement is improved. Severity of Tissue Post Debridement is: Fat layer exposed. Post procedure Diagnosis Wound #50: Same as Pre-Procedure Plan Follow-up Appointments: Return appointment in 3 weeks. Bathing/ Shower/ Hygiene: May shower and wash wound with soap and water. - on days that dressing is changed Edema Control - Lymphedema / SCD / Other: Elevate legs to the level of the heart or above for 30 minutes daily and/or when sitting, [name] frequency of: - throughout the day Compression stocking or Garment 30-40 mm/Hg pressure to: - Juxtalite to both legs daily Off-Loading: Roho cushion for wheelchair Turn and reposition every 2 hours WOUND #38R: - T - Web between 1st and 2nd Wound Laterality: Right oe Cleanser: Soap and Water Every Other Day/30 Days Discharge Instructions: May shower and wash wound with dial antibacterial soap and water prior to dressing change. Peri-Wound Care: Sween Lotion (Moisturizing  lotion) Every Other Day/30 Days Discharge Instructions: Apply moisturizing lotion as directed Prim Dressing: KerraCel Ag Gelling Fiber Dressing, 2x2 in (silver alginate) Every Other Day/30 Days ary Discharge Instructions: Apply silver alginate to wound bed as instructed Secondary Dressing: Woven Gauze Sponge, Non-Sterile 4x4 in Every Other Day/30 Days Discharge Instructions: Apply over primary dressing as directed. Secured With: [employer], 4.5x3.1 (in/yd) Every Other Day/30 Days Discharge Instructions: Secure with Kerlix as directed. Secured With: Paper T ape, 2x10 (in/yd) Every Other Day/30 Days Discharge Instructions: Secure dressing with tape as  directed. WOUND #41R: - Ischium Wound Laterality: Left Cleanser: Soap and Water Every Other Day/30 Days Discharge Instructions: May shower and wash wound with dial antibacterial soap and water prior to dressing change. Peri-Wound Care: Triad Hydrophilic Wound Dressing Tube, 6 (oz) (Generic) Every Other Day/30 Days Discharge Instructions: Apply to periwound with each dressing change Prim Dressing: PolyMem Silver Non-Adhesive Dressing, 4.25x4.25 in (Generic) Every Other Day/30 Days ary Discharge Instructions: Apply to wound bed as instructed Secondary Dressing: ComfortFoam Border, 4x4 in (silicone border) Every Other Day/30 Days Discharge Instructions: Apply over primary dressing as directed. WOUND #49: - Ischium Wound Laterality: Left, Proximal Cleanser: Soap and Water Every Other Day/30 Days Discharge Instructions: May shower and wash wound with dial antibacterial soap and water prior to dressing change. Peri-Wound Care: Triad Hydrophilic Wound Dressing Tube, 6 (oz) (Generic) Every Other Day/30 Days Discharge Instructions: Apply to periwound with each dressing change Prim Dressing: PolyMem Silver Non-Adhesive Dressing, 4.25x4.25 in (Generic) Every Other Day/30 Days ary Discharge Instructions: Apply to wound bed as instructed Secondary Dressing: ComfortFoam Border, 4x4 in (silicone border) Every Other Day/30 Days Discharge Instructions: Apply over primary dressing as directed. WOUND #50: - Ankle Wound Laterality: Dorsal, Right Cleanser: Soap and Water Every Other Day/30 Days Discharge Instructions: May shower and wash wound with dial antibacterial soap and water prior to dressing change. Cleanser: Wound Cleanser Every Other Day/30 Days Discharge Instructions: Cleanse the wound with wound cleanser prior to applying [name] clean dressing using gauze sponges, not tissue or cotton balls. Prim Dressing: KerraCel Ag Gelling Fiber Dressing, 2x2 in (silver alginate) Every Other Day/30  Days ary Discharge Instructions: Apply silver alginate to wound bed as instructed Secondary Dressing: ComfortFoam Border, 4x4 in (silicone border) Every Other Day/30 Days Discharge Instructions: Apply over primary dressing as directed. 1. I do not think it is necessary to have silver alginate on the right foot anymore some gauze between the toes and moisturizer would seem reasonable 2. Silver alginate to the right dorsal lower leg/ankle with [name] covering protection 3. Continuing with the silver polymen on the buttock. I continue to think that there must be [name] contact dermatitis here I would like to give him [name] course of topical steroids to see if I can calm down this but I am also looking to see what this could be. He is using some sort of foam bordered here and I wonder if that could be the issue Electronic Signature(s) Signed: 01/12/2021 5:55:48 PM By: Linton Ham MD Entered By: Linton Ham on 01/12/2021 08:58:20 -------------------------------------------------------------------------------- SuperBill Details Patient Name: Date of Service: Car, [name] 01/12/2021 Medical Record Number: [id number] Patient Account Number: [id number] Date of Birth/Sex: Treating RN: June 29, 1986 (35 y.o. 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

## 2021-02-02 ENCOUNTER — Encounter (HOSPITAL_BASED_OUTPATIENT_CLINIC_OR_DEPARTMENT_OTHER): Payer: 59 | Attending: Internal Medicine | Admitting: Internal Medicine

## 2021-02-02 ENCOUNTER — Other Ambulatory Visit: Payer: Self-pay

## 2021-02-02 DIAGNOSIS — Z87891 Personal history of nicotine dependence: Secondary | ICD-10-CM | POA: Diagnosis not present

## 2021-02-02 DIAGNOSIS — L97519 Non-pressure chronic ulcer of other part of right foot with unspecified severity: Secondary | ICD-10-CM | POA: Diagnosis present

## 2021-02-02 DIAGNOSIS — L89323 Pressure ulcer of left buttock, stage 3: Secondary | ICD-10-CM | POA: Diagnosis not present

## 2021-02-02 DIAGNOSIS — G8221 Paraplegia, complete: Secondary | ICD-10-CM | POA: Insufficient documentation

## 2021-02-02 DIAGNOSIS — I87332 Chronic venous hypertension (idiopathic) with ulcer and inflammation of left lower extremity: Secondary | ICD-10-CM | POA: Diagnosis not present

## 2021-02-02 DIAGNOSIS — L97511 Non-pressure chronic ulcer of other part of right foot limited to breakdown of skin: Secondary | ICD-10-CM | POA: Insufficient documentation

## 2021-02-02 NOTE — Progress Notes (Signed)
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 the dimensions are better. 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 Problematic areas on the plantar aspect of the first second toe web space on the left and the dorsal aspect of the first second toe web space on the right. At one point I felt these were probably related to chronic fungal infections in treated him aggressively for this although we have not made any improvement here. 08/11/17; left lateral leg. Surface continues to improve with the Iodoflex although we are not seeing much improvement in overall wound dimensions. Areas on his plantar left foot and right foot show no improvement. In fact the right foot looks somewhat worse 08/18/17; left lateral leg. We changed to [facility] Blue last week after [name] prolonged course of Iodoflex which helps get the surface better. It appears that the wound with is improved. Continue with difficult areas on the left dorsal first second and plantar first second on the right  09/01/17; patient arrives in clinic today having had [name] temperature of 103 yesterday. He was seen in the ER and [facility]. The patient was concerned he could have cellulitis again in the right leg however they diagnosed him with [name] UTI and he is now on Keflex. He has [name] history of cellulitis which is been recurrent and difficult but this is been in the left leg, in the past 5 use doxycycline. He does in and out catheterizations at home which are risk factors for UTI 09/08/17; patient will be completing his Keflex this weekend. The erythema on the left leg is considerably better. He has [name] new  wound today on the medial part of the right leg small superficial almost looks like [name] skin tear. He has worsening of the area on the right dorsal first and second toe. His major area on the left lateral leg is better. Using Hydrofera Blue on all areas 09/15/17; gradual reduction in width on the long wound in the left lateral leg. No debridement required. He also has wounds on the plantar aspect of his left first second toe web space and on the dorsal aspect of the right first second toe web space. 09/22/17; there continues to be very gradual improvements in the dimensions of the left lateral leg wound. He hasn't round erythematous spot with might be pressure on his wheelchair. There is no evidence obviously of infection no purulence no warmth He has [name] dry scaled area on the plantar aspect of the left first second toe Improved area on the dorsal right first second toe. 09/29/17; left lateral leg wound continues to improve in dimensions mostly with an is still [name] fairly long but increasingly narrow wound. He has [name] dry scaled area on the plantar aspect of his left first second toe web space Increasingly concerning area on the dorsal right first second toe. In fact I am concerned today about possible cellulitis around this wound. The areas extending up his second toe and although there is deformities here almost appears to abut on the nailbed. 10/06/17; left lateral leg wound continues to make very gradual progress. Tissue culture I did from the right first second toe dorsal foot last time grew MRSA and enterococcus which was vancomycin sensitive. This was not sensitive to clindamycin or doxycycline. He is allergic to Zyvox and sulfa we have therefore arrange for him to have dalvance infusion tomorrow. He is had this in the past and tolerated it well 10/20/17; left lateral leg wound continues to make decent progress. This is certainly reduced in terms of with there is advancing epithelialization.The  cellulitis in the right foot looks better although he still has [name] deep wound in the dorsal aspect of the first second toe web space. Plantar left first toe web space on the left I think is making some progress 10/27/17; left lateral leg wound continues to make decent progress. Advancing epithelialization.using Hydrofera Blue The right first second toe web space wound is better-looking using silver alginate Improvement in the left plantar first second toe web space. Again using silver alginate 11/03/17 left lateral leg wound continues to make decent progress albeit slowly. Using [facility] The right per second toe web space continues to be [name] very problematic looking punched out wound. I obtained [name] piece of tissue for deep culture I did extensively treated this for fungus. It is difficult to imagine that this is [name] pressure area as the patient states other than going outside he doesn't really wear shoes at home The  left plantar first second toe web space looked fairly senescent. Necrotic edges. This required debridement change to [facility] Blue to all wound areas 11/10/17; left lateral leg wound continues to contract. Using Hydrofera Blue On the right dorsal first second toe web space dorsally. Culture I did of this area last week grew MRSA there is not an easy oral option in this patient was multiple antibiotic allergies or intolerances. This was only [name] rare culture isolate I'[name] therefore going to use Bactroban under silver alginate On the left plantar first second toe web space. Debridement is required here. This is also unchanged 11/17/17; left lateral leg wound continues to contract using Hydrofera Blue this is no longer the major issue. The major concern here is the right first second toe web space. He now has an open area going from dorsally to the plantar aspect. There is now wound on the inner lateral part of the first toe. Not [name] very viable surface on this. There is erythema spreading  medially into the forefoot. No major change in the left first second toe plantar wound 11/24/17; left lateral leg wound continues to contract using Hydrofera Blue. Nice improvement today The right first second toe web space all of this looks [name] lot less angry than last week. I have given him clindamycin and topical Bactroban for MRSA and terbinafine for the possibility of underlining tinea pedis that I could not control with ketoconazole. Looks somewhat better The area on the plantar left first second toe web space is weeping with dried debris around the wound 12/01/17; left lateral leg wound continues to contract he Hydrofera Blue. It is becoming thinner in terms of with nevertheless it is making good improvement. The right first second toe web space looks less angry but still [name] large necrotic-looking wounds starting on the plantar aspect of the right foot extending between the toes and now extensively on the base of the right second toe. I gave him clindamycin and topical Bactroban for MRSA anterior benefiting for the possibility of underlying tinea pedis. Not looking better today The area on the left first/second toe looks better. Debrided of necrotic debris 12/05/17* the patient was worked in urgently today because over the weekend he found blood on his incontinence bad when he woke up. He was found to have an ulcer by his wife who does most of his wound care. He came in today for [place] to look at this. He has not had [name] history of wounds in his buttocks in spite of his paraplegia. 12/08/17; seen in follow-up today at his usual appointment. He was seen earlier this week and found to have [name] new wound on his buttock. We also follow him for wounds on the left lateral leg, left first second toe web space and right first second toe web space 12/15/17; we have been using Hydrofera Blue to the left lateral leg which has improved. The right first second toe web space has also improved. Left first second toe web  space plantar aspect looks stable. The left buttock has worsened using Santyl. Apparently the buttock has drainage 12/22/17; we have been using Hydrofera Blue to the left lateral leg which continues to improve now 2 small wounds separated by normal skin. He tells [place] he had [name] fever up to 100 yesterday he is prone to UTIs but has not noted anything different. He does in and out catheterizations. The area between the first and second toes today does not look good necrotic surface covered with what looks  to be purulent drainage and erythema extending into the third toe. I had gotten this to something that I thought look better last time however it is not look good today. He also has [name] necrotic surface over the buttock wound which is expanded. I thought there might be infection under here so I removed [name] lot of the surface with [name] #5 curet though nothing look like it really needed culturing. He is been using Santyl to this area 12/27/17; his original wound on the left lateral leg continues to improve using Hydrofera Blue. I gave him samples of Baxdella although he was unable to take them out of fear for an allergic reaction ["lump in his throat"].the culture I did of the purulent drainage from his second toe last week showed both enterococcus and [name] set Enterobacter I was also concerned about the erythema on the bottom of his foot although paradoxically although this looks somewhat better today. 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 We've been using silver alginate here. They came up today that he sleeps in [name] recliner, I'[name] not sure why but I asked him to stop this 01/03/18; his original wound we've been using Hydrofera Blue is now separated into 2 areas. Ulcer on his left buttock is better he is off the recliner and sleeping in bed Finally both wound areas between his first and second toes also looks some  better 01/10/18; his original wound on the left lateral leg is now separated into 2 wounds we've been using Hydrofera Blue Ulcer on his left buttock has some drainage. There is [name] small probing site going into muscle layer superiorly.using silver alginate -He arrives today with [name] deep tissue injury on the left heel The wound on the dorsal aspect of his first second toe on the left looks [name] lot betterusing silver alginate ketoconazole The area on the first second toe web space on the right also looks [name] lot bette 01/17/18; his original wound on the left lateral leg continues to progress using Hydrofera Blue Ulcer on his left buttock also is smaller surface healthier except for [name] small probing site going into the muscle layer superiorly. 2.4 cm of tunneling in this area DTI on his left heel we have only been offloading. Looks better than last week no threatened open no evidence of infection the wound on the dorsal aspect of the first second toe on the left continues to look like it's regressing we have only been using silver alginate and terbinafine orally The area in the first second toe web space on the right also looks to be [name] lot better using silver alginate and terbinafine I think this was prompted by tinea pedis 01/31/18; the patient was hospitalized in [place] last week apparently for [name] complicated UTI. He was discharged on cefepime he does in and out catheterizations. In the hospital he was discovered [name] I don't mild elevation of AST and ALT and the terbinafine was stopped.predictably the pressure ulcer on s his buttock looks betterusing silver alginate. The area on the left lateral leg also is better using Hydrofera Blue. The area between the first and second toes on the left better. First and second toes on the right still substantial but better. Finally the DTI on the left heel has held together and looks like it's resolving 02/07/18-he is here in follow-up evaluation for multiple ulcerations.  He has new injury to the lateral aspect of  the last issue [name] pressure ulcer, he states this is from adhesive removal trauma. He states he has tried multiple adhesive products with no success. All other ulcers appear stable. The left heel DTI is resolving. We will continue with same treatment plan and follow-up next week. 02/14/18; follow-up for multiple areas. He has [name] new area last week on the lateral aspect of his pressure ulcer more over the posterior trochanter. The original pressure ulcer looks quite stable has healthy granulation. 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 Almost fully epithelialized on the original superior area using Hydrofera Blue DTI on the left heel has peeled off this week to reveal [name] small superficial wound under denuded skin and subcutaneous tissue Both areas between the first and second toes look better including nothing open on the left 02/21/18; The patient's wounds on his left ischial tuberosity and posterior left greater trochanter actually looked better. He has [name] large area of irritation around the area which I think is contact dermatitis. I am doubtful that this is fungal His original wound on the left lateral calf continues to improve we have been using Hydrofera Blue There is no open area in the left first second toe web space although there is [name] lot of thick callus The DTI on the left heel required debridement today of necrotic surface eschar and subcutaneous tissue using silver alginate Finally the area on the right first second toe webspace continues to contract using silver alginate and ketoconazole 02/28/18 Left ischial tuberosity wounds look better using silver alginate. Original wound on the left calf only has one small open area left using Hydrofera Blue DTI on the left heel required debridement mostly removing skin from around this wound surface. Using  silver alginate The areas on the right first/second toe web space using silver alginate and ketoconazole 03/08/18 on evaluation today patient appears to be doing decently well as best I can tell in regard to his wounds. This is the first time that I have seen him as he generally is followed by Dr. Dellia Nims. With that being said none of his wounds appear to be infected he does have an area where there is some skin covering what appears to be [name] new wound on the left dorsal surface of his great toe. This is right at the nail bed. With that being said I do believe that debrided away some of the excess skin can be of benefit in this regard. Otherwise he has been tolerating the dressing changes without complication. 03/14/18; patient arrives today with the multiplicity of wounds that we are following. He has not been systemically unwell Original wound on the left lateral calf now only has 2 small open areas we've been using Hydrofera Blue which should continue The deep tissue injury on the left heel requires debridement today. We've been using silver alginate The left first second toe and the right first second toe are both are reminiscence what I think was tinea pedis. Apparently some of the callus Surface between the toes was removed last week when it started draining. Purulent drainage coming from the wound on the ischial tuberosity on the left. 03/21/18-He is here in follow-up evaluation for multiple wounds. 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 He tolerated debridement. The only change to last week's recommendations is to discontinue antifungal cream between toes. He will follow-up next week  03/28/18; following up for multiple wounds;Concern this week is streaking redness and swelling in the right foot. He is going to need antibiotics for this. 03/31/18; follow-up for right foot cellulitis. Streaking redness and swelling in the  right foot on 03/28/18. 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 I put him on clindamycin 300 mg every 6 and brought him in for [name] quick check. He has an open wound between his first and second toes on the right foot as [name] potential source. 04/04/18; Right foot cellulitis is resolving he is completing clindamycin. This is truly good news Left lateral calf wound which is initial wound only has one small open area inferiorly this is close to healing out. He has compression stockings. We will use Hydrofera Blue right down to the epithelialization of this Nonviable surface on the left heel which was initially pressure with [name] DTI. We've been using Hydrofera Blue. I'[name] going to switch this back to silver alginate Left first second toe/tinea pedis this looks better using silver alginate Right first second toe tinea pedis using silver alginate Large pressure ulcers on theLeft ischial tuberosity. Small wound here Looks better. I am uncertain about the surface over the large wound. Using silver alginate 04/11/18; Cellulitis in the right foot is resolved Left lateral calf wound which was his original wounds still has 2 tiny open areas remaining this is just about closed Nonviable surface on the left heel is better but still requires debridement Left first second toe/tinea pedis still open using silver alginate Right first second toe wound tinea pedis I asked him to go back to using ketoconazole and silver alginate Large pressure ulcers on the left ischial tuberosity this shear injury here is resolved. Wound is smaller. No evidence of infection using silver alginate 04/18/18; Patient arrives with an intense area of cellulitis in the right mid lower calf extending into the right heel area. Bright red and warm. Smaller area on the left anterior leg. He has [name] significant history of MRSA. He will definitely need antibioticsdoxycycline He now has 2 open areas on the left ischial tuberosity the  original large wound and now [name] satellite area which I think was above his initial satellite areas. Not [name] wonderful surface on this satellite area surrounding erythema which looks like pressure related. His left lateral calf wound again his original wound is just about closed Left heel pressure injury still requiring debridement Left first second toe looks [name] lot better using silver alginate Right first second toe also using silver alginate and ketoconazole cream also looks better 04/20/18; the patient was worked in early today out of concerns with his cellulitis on the right leg. I had started him on doxycycline. This was 2 days ago. His wife was concerned about the swelling in the area. Also concerned about the left buttock. He has not been systemically unwell no fever chills. No nausea vomiting or diarrhea 04/25/18; the patient's left buttock wound is continued to deteriorate he is using Hydrofera Blue. He is still completing clindamycin for the cellulitis on the right leg although all of this looks better. 05/02/18 Left buttock wound still with [name] lot of drainage and [name] very tightly adherent fibrinous necrotic surface. He has [name] deeper area superiorly The left lateral calf wound is still closed DTI wound on the left heel necrotic surface especially the circumference using Iodoflex Areas between his left first second toe and right first second toe both look better. Dorsally and the right first second toe he  had [name] necrotic surface although at smaller. In using silver alginate and ketoconazole. I did [name] culture last week which was [name] deep tissue culture of the reminiscence of the open wound on the right first second toe dorsally. This grew [name] few Acinetobacter and [name] few methicillin-resistant staph aureus. Nevertheless the area actually this week looked better. I didn't feel the need to specifically address this at least in terms of systemic antibiotics. 05/09/18; wounds are measuring larger more drainage per  our intake. We are using Santyl covered with alginate on the large superficial buttock wounds, Iodosorb on the left heel, ketoconazole and silver alginate to the dorsal first and second toes bilaterally. 05/16/18; The area on his left buttock better in some aspects although the area superiorly over the ischial tuberosity required an extensive debridement.using Santyl Left heel appears stable. Using Iodoflex The areas between his first and second toes are not bad however there is spreading erythema up the dorsal aspect of his left foot this looks like cellulitis again. He is insensate the erythema is really very brilliant.o Erysipelas He went to see an allergist days ago because he was itching part of this he had lab work done. This showed [name] white count of 15.1 with 70% neutrophils. Hemoglobin of 11.4 and [name] platelet count of 659,000. Last white count we had in Epic was [name] 2-1/2 years ago which was 25.9 but he was ill at the time. He was able to show me some lab work that was done by his primary physician the pattern is about the same. 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 But prompted me to go ahead and do x-rays of both feet and the pelvis rule out osteomyelitis. He also had [name] comprehensive metabolic panel this was reasonably normal his albumin was 3.7 liver function tests BUN/creatinine all normal 05/23/18; x-rays of both his feet from last week were negative for underlying pulmonary abnormality. The x-ray of his pelvis however showed mild irregularity in the left ischial which may represent some early osteomyelitis. The wound in the left ischial continues to get deeper clearly now exposed muscle. Each week necrotic surface material over this area. Whereas the rest of the wounds do not look so bad. The left ischial wound we have been using Santyl and calcium alginate T the left heel surface necrotic debris using Iodoflex o The left lateral leg is still  healed Areas on the left dorsal foot and the right dorsal foot are about the same. There is some inflammation on the left which might represent contact dermatitis, fungal dermatitis I am doubtful cellulitis although this looks better than last week 05/30/18; CT scan done at Hospital did not show any osteomyelitis or abscess. Suggested the possibility of underlying cellulitis although I don't see [name] lot of evidence of this at the bedside The wound itself on the left buttock/upper thigh actually looks somewhat better. No debridement Left heel also looks better no debridement continue Iodoflex Both dorsal first second toe spaces appear better using Lotrisone. Left still required debridement 06/06/18; Intake reported some purulent looking drainage from the left gluteal wound. Using Santyl and calcium alginate Left heel looks better although still [name] nonviable surface requiring debridement The left dorsal foot first/second webspace actually expanding and somewhat deeper. I may consider doing [name] shave biopsy of this area Right dorsal foot first/second webspace appears stable to improved. Using Lotrisone and silver alginate to both these areas 06/13/18 Left gluteal surface looks  better. Now separated in the 2 wounds. No debridement required. Still drainage. We'll continue silver alginate Left heel continues to look better with Iodoflex continue this for at least another week Of his dorsal foot wounds the area on the left still has some depth although it looks better than last week. We've been using Lotrisone and silver alginate 06/20/18 Left gluteal continues to look better healthy tissue Left heel continues to look better healthy granulation wound is smaller. He is using Iodoflex and his long as this continues continue the Iodoflex Dorsal right foot looks better unfortunately dorsal left foot does not. There is swelling and erythema of his forefoot. He had minor trauma to this several days ago but doesn't  think this was enough to have caused any tissue injury. Foot looks like cellulitis, we have had this problem before 06/27/18 on evaluation today patient appears to be doing [name] little worse in regard to his foot ulcer. 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 Both of which would really be his only options for treating this infection. In the past he has been given and effusion of Orbactiv. This is done very well for him in the past again it's one time dosing IV antibiotic therapy. Subsequently I do believe this is something we're gonna need to see about doing at this point in time. Currently his other wounds seem to be doing somewhat better in my pinion I'[name] pretty happy in that regard. 07/03/18 on evaluation today patient's wounds actually appear to be doing fairly well. He has been tolerating the dressing changes without complication. All in all he seems to be showing signs of improvement. In regard to the antibiotics he has been dealing with infectious disease since I saw him last week as far as getting this scheduled. In the end he's going [facility] be [facility] this coming Friday. In the meantime he has been continuing to perform the dressing changes in such as previous. There does not appear to be any evidence of infection worsengin at this time. 07/10/18; Since I last saw this man 2 [name] ago things have actually improved. IV antibiotics of resulted in less forefoot erythema although there is still some present. He is not systemically unwell Left buttock wounds 2 now have no depth there is increased epithelialization Using silver alginate Left heel still requires debridement using Iodoflex Left dorsal foot still with [name] sizable wound about the size of [name] border but healthy granulation Right dorsal foot still with [name]  slitlike area using silver alginate 07/18/18; the patient's cellulitis in the left foot is improved in fact I think it is on its [name] to resolving. Left buttock wounds 2 both look better although the larger one has hypertension granulation we've been using silver alginate Left heel has some thick circumferential redundant skin over the wound edge which will need to be removed today we've been using Iodoflex Left dorsal foot is still [name] sizable wound required debridement using silver alginate The right dorsal foot is just about closed only [name] small open area remains here 07/25/18; left foot cellulitis is resolved Left buttock wounds 2 both look better. Hyper-granulation on the major area Left heel as some debris over the surface but otherwise looks [name] healthier wound. Using silver collagen Right dorsal foot is just about closed 07/31/18; arrives with  our intake nurse worried about purulent drainage from the buttock. We had hyper-granulation here last week His buttock wounds 2 continue to look better Left heel some debris over the surface but measuring smaller. Right dorsal foot unfortunately has openings between the toes Left foot superficial wound looks less aggravated. 08/07/18 Buttock wounds continue to look better although some of her granulation and the larger medial wound. silver alginate Left heel continues to look [name] lot better.silver collagen Left foot superficial wound looks less stable. Requires debridement. He has [name] new wound superficial area on the foot on the lateral dorsal foot. Right foot looks better using silver alginate without Lotrisone 08/14/2018; patient was in the ER last week diagnosed with [name] UTI. He is now on Cefpodoxime and Macrodantin. Buttock wounds continued to be smaller. Using silver alginate Left heel continues to look better using silver collagen Left foot superficial wound looks as though it is improving Right dorsal foot area is just about healed. 08/21/2018; patient is  completed his antibiotics for his UTI. He has 2 open areas on the buttocks. There is still not closed although the surface looks satisfactory. Using silver alginate Left heel continues to improve using silver collagen The bilateral dorsal foot areas which are at the base of his first and second toes/possible tinea pedis are actually stable on the left but worse on the right. The area on the left required debridement of necrotic surface. After debridement I obtained [name] specimen for PCR culture. The right dorsal foot which is been just about healed last week is now reopened 08/28/2018; culture done on the left dorsal foot showed coag negative staph both staph epidermidis and Lugdunensis. I think this is worthwhile initiating systemic treatment. I will use doxycycline given his long list of allergies. The area on the left heel slightly improved but still requiring debridement. The large wound on the buttock is just about closed whereas the smaller one is larger. Using silver alginate in this area 09/04/2018; patient is completing his doxycycline for the left foot although this continues to be [name] very difficult wound area with very adherent necrotic debris. We are using silver alginate to all his wounds right foot left foot and the small wounds on his buttock, silver collagen on the left heel. 09/11/2018; once again this patient has intense erythema and swelling of the left forefoot. Lesser degrees of erythema in the right foot. He has [name] long list of allergies and intolerances. I will reinstitute doxycycline. 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 Using silver alginate Left heel also looks better using silver collagen Unfortunately both the areas on his feet look worse. The area on the left first second webspace is now gone through to the plantar part of his foot. The area on the left foot anteriorly is irritated with erythema and swelling in the  forefoot. 09/25/2018 His wound on the left plantar heel looks better. Using silver collagen The area on the left buttock 2 small remnant areas. One is closed one is still open. Using silver alginate The areas between both his first and second toes look worse. This in spite of long-standing antifungal therapy with ketoconazole and silver alginate which should have antifungal activity He has small areas around his original wound on the left calf one is on the bottom of the original scar tissue and one superiorly both of these are small and superficial but again given wound history in this site this is  worrisome 10/02/2018 Left plantar heel continues to gradually contract using silver collagen Left buttock wound is unchanged using silver alginate The areas on his dorsal feet between his first and second toes bilaterally look about the same. I prescribed clindamycin ointment to see if we can address chronic staph colonization and also the underlying possibility of erythrasma The left lateral lower extremity wound is actually on the lateral part of his ankle. Small open area here. We have been using silver alginate 10/09/2018; Left plantar heel continues to look healthy and contract. No debridement is required Left buttock slightly smaller with [name] tape injury wound just below which was new this week Dorsal feet somewhat improved I have been using clindamycin Left lateral looks lower extremity the actual open area looks worse although [name] lot of this is epithelialized. I am going to change to silver collagen today He has [name] lot more swelling in the right leg although this is not pitting not red and not particularly warm there is [name] lot of spasm in the right leg usually indicative of people with paralysis of some underlying discomfort. We have reviewed his vascular status from 2017 he had [name] left greater saphenous vein ablation. I wonder about referring him back to vascular surgery if the area on the left  leg continues to deteriorate. 10/16/2018 in today for follow-up and management of multiple lower extremity ulcers. His left Buttock wound is much lower smaller and almost closed completely. The wound to the left ankle has began to reopen with Epithelialization and some adherent slough. He has multiple new areas to the left foot and leg. The left dorsal foot without much improvement. Wound present between left great webspace and 2nd toe. Erythema and edema present right leg. Right LE ultrasound obtained on 10/10/18 was negative for DVT . 10/23/2018; Left buttock is closed over. Still dry macerated skin but there is no open wound. I suspect this is chronic pressure/moisture Left lateral calf is quite [name] bit worse than when I saw this last. There is clearly drainage here he has macerated skin into the left plantar heel. We will change the primary dressing to alginate Left dorsal foot has some improvement in overall wound area. Still using clindamycin and silver alginate Right dorsal foot about the same as the left using clindamycin and silver alginate The erythema in the right leg has resolved. He is DVT rule out was negative Left heel pressure area required debridement although the wound is smaller and the surface is health 10/26/2018 The patient came back in for his nurse check today predominantly because of the drainage coming out of the left lateral leg with [name] recent reopening of his original wound on the left lateral calf. He comes in today with [name] large amount of surrounding erythema around the wound extending from the calf into the ankle and even in the area on the dorsal foot. He is not systemically unwell. He is not febrile. Nevertheless this looks like cellulitis. We have been using silver alginate to the area. I changed him to [name] regular visit and I am going to prescribe him doxycycline. The rationale here is [name] long list of medication intolerances and [name] history of MRSA. I did not see anything  that I thought would provide [name] valuable culture 10/30/2018 Follow-up from his appointment 4 days ago with really an extensive area of cellulitis in the left calf left lateral ankle and left dorsal foot. I put him on doxycycline. He has [name] long list of medication allergies  which are true allergy reactions. 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 In any case he is [name] lot better today. The erythema especially in the anterior and lateral left calf is better. He still has left ankle erythema. He also is complaining about increasing edema in the right leg we have only been using Kerlix Coban and he has been doing the wraps at home. Finally he has [name] spotty rash on the medial part of his upper left calf which looks like folliculitis or perhaps wrap occlusion type injury. Small superficial macules not pustules 11/06/18 patient arrives today with again [name] considerable degree of erythema around the wound on the left lateral calf extending into the dorsal ankle and dorsal foot. This is [name] lot worse than when I saw this last week. He is on doxycycline really with not [name] lot of improvement. He has not been systemically unwell Wounds on the; left heel actually looks improved. Original area on the left foot and proximity to the first and second toes looks about the same. He has superficial areas on the dorsal foot, anterior calf and then the reopening of his original wound on the left lateral calf which looks about the same The only area he has on the right is the dorsal webspace first and second which is smaller. He has [name] large area of dry erythematous skin on the left buttock small open area here. 11/13/2018; the patient arrives in much better condition. The erythema around the wound on the left lateral calf is [name] lot better. 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 In any case this is [name] lot better. The area on the left heel is very small and just about resolved using silver collagen we have been using silver alginate to the areas on his dorsal feet 11/20/2018; his wounds include the left lateral calf, left heel, dorsal aspects of both feet just proximal to the first second webspace. He is stable to slightly improved. I did not think any changes to his dressings were going to be necessary 11/27/2018 he has [name] reopening on the left buttock which is surrounded by what looks like tinea or perhaps some other form of dermatitis. The area on the left dorsal foot has some erythema around it I have marked this area but I am not sure whether this is cellulitis or not. Left heel is not closed. Left calf the reopening is really slightly longer and probably worse 1/13; in general things look better and smaller except for the left dorsal foot. Area on the left heel is just about closed, left buttock looks better only [name] small wound remains in the skin looks better [using Lotrisone] 1/20; the area on the left heel only has [name] few remaining open areas here. Left lateral calf about the same in terms of size, left dorsal foot slightly larger right lateral foot still not closed. The area on the left buttock has no open wound and the surrounding skin looks [name] lot better 1/27; the area on the left heel is closed. Left lateral calf better but still requiring extensive debridements. The area on his left buttock is closed. He still has the open areas on the left dorsal foot which is slightly smaller in the right foot which is slightly expanded. We have been using Iodoflex on these areas as well 2/3; left heel is  closed. Left lateral calf still requiring debridement using Iodoflex there is no open area on his left buttock however he has dry scaly skin over [name] large area of this. Not really responding well to the Lotrisone. Finally the areas on his dorsal feet at the level of the  first second webspace are slightly smaller on the right and about the same on the left. Both of these vigorously debrided with Anasept and gauze 2/10; left heel remains closed he has dry erythematous skin over the left buttock but there is no open wound here. Left lateral leg has come in and with. Still requiring debridement we have been using Iodoflex here. Finally the area on the left dorsal foot and right dorsal foot are really about the same extremely dry callused fissured areas. He does not yet have [name] dermatology appointment 2/17; left heel remains closed. He has [name] new open area on the left buttock. The area on the left lateral calf is bigger longer and still covered in necrotic debris. No major change in his foot areas bilaterally. I am awaiting for [name] dermatologist to look on this. We have been using ketoconazole I do not know that this is been doing any good at all. 2/24; left heel remains closed. The left buttock wound that was new reopening last week looks better. The left lateral calf appears better also although still requires debridement. The major area on his foot is the left first second also requiring debridement. We have been putting Prisma on all wounds. I do not believe that the ketoconazole has done too much good for his feet. He will use Lotrisone I am going to give him [name] 2-week course of terbinafine. We still do not have [name] dermatology appointment 3/2 left heel remains closed however there is skin over bone in this area I pointed this out to him today. The left buttock wound is epithelialized but still does not look completely stable. The area on the left leg required debridement were using silver collagen here. With regards to his feet we changed to Lotrisone last week and silver alginate. 3/9; left heel remains closed. Left buttock remains closed. The area on the right foot is essentially closed. The left foot remains unchanged. Slightly smaller on the left lateral calf. Using  silver collagen to both of these areas 3/16-Left heel remains closed. Area on right foot is closed. Left lateral calf above the lateral malleolus open wound requiring debridement with easy bleeding. Left dorsal wound proximal to first toe also debrided. Left ischial area open new. Patient has been using Prisma with wrapping every 3 days. Dermatology appointment is apparently tomorrow.Patient has completed his terbinafine 2-week course with some apparent improvement according to him, there is still flaking and dry skin in his foot on the left 3/23; area on the right foot is reopened. The area on the left anterior foot is about the same still [name] very necrotic adherent surface. He still has the area on the left leg and reopening is on the left buttock. He apparently saw dermatology although I do not have [name] note. According to the patient who is usually fairly well informed they did not have any good ideas. Put him on oral terbinafine which she is been on before. 3/30; using silver collagen to all wounds. Apparently his dermatologist put him on doxycycline and rifampin presumably some culture grew staph. I do not have this result. He remains on terbinafine although I have used terbinafine on him before 4/6; patient has had  [name] fairly substantial reopening on the right foot between the first and second toes. He is finished his terbinafine and I believe is on doxycycline and rifampin still as prescribed by dermatology. We have been using silver collagen to all his wounds although the patient reports that he thinks silver alginate does better on the wounds on his buttock. 4/13; the area on his left lateral calf about the same size but it did not require debridement. Left dorsal foot just proximal to the webspace between the first and second toes is about the same. Still nonviable surface. I note some superficial bronze discoloration of the dorsal part of his foot Right dorsal foot just proximal to the first  and second toes also looks about the same. I still think there may be the same discoloration I noted above on the left Left buttock wound looks about the same 4/20; left lateral calf appears to be gradually contracting using silver collagen. 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 The left dorsal foot wound is debrided of tightly adherent necrotic debris and really cleans up quite nicely. The right area is worse with expansion. I did not debride this it is now over the base of the second toe The area on his left buttock is smaller no debridement is required using silver collagen 5/4; left calf continues to make good progress. He arrives with erythema around the wounds on his dorsal foot which even extends to the plantar aspect. Very concerning for coexistent infection. He is finished the erythromycin I gave him for possible erythrasma this does not seem to have helped. The area on the left foot is about the same base of the dorsal toes Is area on the buttock looks improved on the left 5/11; left calf and left buttock continued to make good progress. Left foot is about the same to slightly improved. Major problem is on the right foot. He has not had an x-ray. Deep tissue culture I did last week showed both Enterobacter and E. coli. I did not change the doxycycline I put him on empirically although neither 1 of these were plated to doxycycline. He arrives today with the erythema looking worse on both the dorsal and plantar foot. Macerated skin on the bottom of the foot. he has not been systemically unwell 5/18-Patient returns at 1 week, left calf wound appears to be making some progress, left buttock wound appears slightly worse than last time, left foot wound looks slightly better, right foot redness is marginally better. X-ray of both feet show no air or evidence of osteomyelitis. Patient is finished his Omnicef and terbinafine. He  continues to have macerated skin on the bottom of the left foot as well as right 5/26; left calf wound is better, left buttock wound appears to have multiple small superficial open areas with surrounding macerated skin. X-rays that I did last time showed no evidence of osteomyelitis in either foot. He is finished cefdinir and doxycycline. I do not think that he was on terbinafine. He continues to have [name] large superficial open area on the right foot anterior dorsal and slightly between the first and second toes. I did send him to dermatology 2 months ago or so wondering about whether they would do [name] fungal scraping. I do not believe they did but did do [name] culture. We have been using silver alginate to the toe areas, he has been using antifungals at home topically either ketoconazole or Lotrisone. We are using silver  collagen on the left foot, silver alginate on the right, silver collagen on the left lateral leg and silver alginate on the left buttock 6/1; left buttock area is healed. We have the left dorsal foot, left lateral leg and right dorsal foot. We are using silver alginate to the areas on both feet and silver collagen to the area on his left lateral calf 6/8; the left buttock apparently reopened late last week. He is not really sure how this happened. He is tolerating the terbinafine. Using silver alginate to all wounds 6/15; left buttock wound is larger than last week but still superficial. Came in the clinic today with [name] report of purulence from the left lateral leg I did not identify any infection Both areas on his dorsal feet appear to be better. He is tolerating the terbinafine. Using silver alginate to all wounds 6/22; left buttock is about the same this week, left calf quite [name] bit better. His left foot is about the same however he comes in with erythema and warmth in the right forefoot once again. Culture that I gave him in the beginning of May showed Enterobacter and E. coli. I gave him  doxycycline and things seem to improve although neither 1 of these organisms was specifically plated. 6/29; left buttock is larger and dry this week. Left lateral calf looks to me to be improved. Left dorsal foot also somewhat improved right foot completely unchanged. The erythema on the right foot is still present. He is completing the Ceftin dinner that I gave him empirically [see discussion above.) 7/6 - All wounds look to be stable and perhaps improved, the left buttock wound is slightly smaller, per patient bleeds easily, completed ceftin, the right foot redness is less, he is on terbinafine 7/13; left buttock wound about the same perhaps slightly narrower. Area on the left lateral leg continues to narrow. Left dorsal foot slightly smaller right foot about the same. We are using silver alginate on the right foot and Hydrofera Blue to the areas on the left. Unna boot on the left 2 layer compression on the right 7/20; left buttock wound absolutely the same. Area on lateral leg continues to get better. Left dorsal foot require debridement as did the right no major change in the 7/27; left buttock wound the same size necrotic debris over the surface. The area on the lateral leg is closed once again. His left foot looks better right foot about the same although there is some involvement now of the posterior first second toe area. He is still on terbinafine which I have given him for [name] month, not certain [name] centimeter major change 06/25/19-All wounds appear to be slightly improved according to report, left buttock wound looks clean, both foot wounds have minimal to no debris the right dorsal foot has minimal slough. We are using Hydrofera Blue to the left and silver alginate to the right foot and ischial wound. 8/10-Wounds all appear to be around the same, the right forefoot distal part has some redness which was not there before, however the wound looks clean and small. Ischial wound looks about the  same with no changes 8/17; his wound on the left lateral calf which was his original chronic venous insufficiency wound remains closed. Since I last saw him the areas on the left dorsal foot right dorsal foot generally appear better but require debridement. The area on his left initial tuberosity appears somewhat larger to me perhaps hyper granulated and bleeds very easily. We have been  using Hydrofera Blue to the left dorsal foot and silver alginate to everything else 8/24; left lateral calf remains closed. The areas on his dorsal feet on the webspace of the first and second toes bilaterally both look better. The area on the left buttock which is the pressure ulcer stage II slightly smaller. I change the dressing to Hydrofera Blue to all areas 8/31; left lateral calf remains closed. The area on his dorsal feet bilaterally look better. Using Hydrofera Blue. Still requiring debridement on the left foot. No change in the left buttock pressure ulcers however 9/14; left lateral calf remains closed. Dorsal feet look quite [name] bit better than 2 [name] ago. Flaking dry skin also [name] lot better with the ammonium lactate I gave him 2 [name] ago. The area on the left buttock is improved. He states that his Roho cushion developed [name] leak and he is getting [name] new one, in the interim he is offloading this vigorously 9/21; left calf remains closed. Left heel which was [name] possible DTI looks better this week. He had macerated tissue around the left dorsal foot right foot looks satisfactory and improved left buttock wound. I changed his dressings to his feet to silver alginate bilaterally. Continuing Hydrofera Blue on the left buttock. 9/28 left calf remains closed. Left heel did not develop anything [possible DTI] dry flaking skin on the left dorsal foot. Right foot looks satisfactory. Improved left buttock wound. We are using silver alginate on his feet Hydrofera Blue on the buttock. I have asked him to go back to the  Lotrisone on his feet including the wounds and surrounding areas 10/5; left calf remains closed. The areas on the left and right feet about the same. [name] lot of this is epithelialized however debris over the remaining open areas. He is using Lotrisone and silver alginate. The area on the left buttock using Hydrofera Blue 10/26. Patient has been out for 3 [name] secondary to Covid concerns. He tested negative but I think his wife tested positive. He comes in today with the left foot substantially worse, right foot about the same. Even more concerning he states that the area on his left buttock closed over but then reopened and is considerably deeper in one aspect than it was before [stage III wound] 11/2; left foot really about the same as last week. Quarter sized wound on the dorsal foot just proximal to the first second toes. Surrounding erythema with areas of denuded epithelium. This is not really much different looking. Did not look like cellulitis this time however. Right foot area about the same.. We have been using silver alginate alginate on his toes Left buttock still substantial irritated skin around the wound which I think looks somewhat better. We have been using Hydrofera Blue here. 11/9; left foot larger than last week and [name] very necrotic surface. Right foot I think is about the same perhaps slightly smaller. Debris around the circumference also addressed. Unfortunately on the left buttock there is been [name] decline. Satellite lesions below the major wound distally and now [name] an additional one posteriorly we have been using Hydrofera Blue but I think this is [name] pressure issue 11/16; left foot ulcer dorsally again [name] very adherent necrotic surface. Right foot is about the same. Not much change in the pressure ulcer on his left buttock. 11/30; left foot ulcer dorsally basically the same as when I saw him 2 [name] ago. Very adherent fibrinous debris on the wound surface. Patient reports [name] lot  of  drainage as well. 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 We have been using Iodoflex, patient changed back to alginate because of the drainage. Area on his right dorsal foot really looks benign with [name] healthier surface certainly [name] lot better than on the left. Left buttock wounds all improved using Hydrofera Blue 12/7; left dorsal foot again no improvement. Tightly adherent debris. PCR culture I did last week only showed likely skin contaminant. I have gone ahead and done [name] punch biopsy of this which is about the last thing in terms of investigations I can think to do. He has known venous insufficiency and venous hypertension and this could be the issue here. The area on the right foot is about the same left buttock slightly worse according to our intake nurse secondary to [facility] Blue sticking to the wound 12/14; biopsy of the left foot that I did last time showed changes that could be related to wound healing/chronic stasis dermatitis phenomenon no neoplasm. We have been using silver alginate to both feet. I change the one on the left today to Sorbact and silver alginate to his other 2 wounds 12/28; the patient arrives with the following problems; Major issue is the dorsal left foot which continues to be [name] larger deeper wound area. Still with [name] completely nonviable surface Paradoxically the area mirror image on the right on the right dorsal foot appears to be getting better. He had some loss of dry denuded skin from the lower part of his original wound on the left lateral calf. Some of this area looked [name] little vulnerable and for this reason we put him in wrap that on this side this week The area on his left buttock is larger. He still has the erythematous circular area which I think is [name] combination of pressure, sweat. This does not look like cellulitis or fungal dermatitis 11/26/2019; -Dorsal left foot large open wound with depth. Still  debris over the surface. Using Sorbact The area on the dorsal right foot paradoxically has closed over He has [name] reopening on the left ankle laterally at the base of his original wound that extended up into the calf. This appears clean. The left buttock wound is smaller but with very adherent necrotic debris over the surface. We have been using silver alginate here as well The patient had arterial studies done in 2017. He had biphasic waveforms at the dorsalis pedis and posterior tibial bilaterally. ABI in the left was 1.17. Digit waveforms were dampened. He has slight spasticity in the great toes I do not think [name] TBI would be possible 1/11; the patient comes in today with [name] sizable reopening between the first and second toes on the right. This is not exactly in the same location where we have been treating wounds previously. According to our intake nurse this was actually fairly deep but 0.6 cm. The area on the left dorsal foot looks about the same the surface is somewhat cleaner using Sorbact, his MRI is in 2 days. We have not managed yet to get arterial studies. The new reopening on the left lateral calf looks somewhat better using alginate. The left buttock wound is about the same using alginate 1/18; the patient had his ARTERIAL studies which were quite normal. ABI in the right at 1.13 with triphasic/biphasic waveforms on the left ABI 1.06 again with triphasic/biphasic waveforms. It would not have been possible to have done [name] toe brachial index because of spasticity. We have  been using Sorbac to the left foot alginate to the rest of his wounds on the right foot left lateral calf and left buttock 1/25; arrives in clinic with erythema and swelling of the left forefoot worse over the first MTP area. This extends laterally dorsally and but also posteriorly. Still has an area on the left lateral part of the lower part of his calf wound it is eschared and clearly not closed. Area on the left buttock  still with surrounding irritation and erythema. Right foot surface wound dorsally. The area between the right and first and second toes appears better. 2/1; The left foot wound is about the same. Erythema slightly better I gave him [name] week of doxycycline empirically Right foot wound is more extensive extending between the toes to the plantar surface Left lateral calf really no open surface on the inferior part of his original wound however the entire area still looks vulnerable Absolutely no improvement in the left buttock wound required debridement. 2/8; the left foot is about the same. Erythema is slightly improved I gave him clindamycin last week. Right foot looks better he is using Lotrimin and silver alginate He has [name] breakdown in the left lateral calf. Denuded epithelium which I have removed Left buttock about the same were using Hydrofera Blue 2/15; left foot is about the same there is less surrounding erythema. Surface still has tightly adherent debris which I have debriding however not making any progress Right foot has [name] substantial wound on the medial right second toe between the first and second webspace. Still an open area on the left lateral calf distal area. Buttock wound is about the same 2/22; left foot is about the same less surrounding erythema. Surface has adherent debris. Polymen Ag Right foot area significant wound between the first and second toes. We have been using silver alginate here Left lateral leg polymen Ag at the base of his original venous insufficiency wound Left buttock some improvement here 3/1; Right foot is deteriorating in the first second toe webspace. Larger and more substantial. We have been using silver alginate. Left dorsal foot about the same markedly adherent surface debris using PolyMem Ag Left lateral calf surface debris using PolyMem AG Left buttock is improved again using PolyMem Ag. He is completing his terbinafine. The erythema in the foot  seems better. He has been on this for 2 [name] 3/8; no improvement in any wound area in fact he has [name] small open area on the dorsal midfoot which is new this week. He has not gotten his foot x-rays yet 3/15; his x-rays were both negative for osteomyelitis of both feet. No major change in any of his wounds on the extremities however his buttock wounds are better. We have been using polymen on the buttocks, left lower leg. Iodoflex on the left foot and silver alginate on the right 3/22; arrives in clinic today with the 2 major issues are the improvement in the left dorsal foot wound which for once actually looks healthy with [name] nice healthy wound surface without debridement. Using Iodoflex here. Unfortunately on the left lateral calf which is in the distal part of his original wound he came to the clinic here for there was purulent drainage noted some increased breakdown scattered around the original area and [name] small area proximally. We we are using polymen here will change to silver alginate today. His buttock wound on the left is better and I think the area on the right first second toe webspace is  also improved 3/29; left dorsal foot looks better. Using Iodoflex. Left ankle culture from deterioration last time grew E. coli, Enterobacter and Enterococcus. I will give him [name] course of cefdinir although that will not cover Enterococcus. The area on the right foot in the webspace of the first and second toe lateral first toe looks better. The area on his buttock is about healed Vascular appointment is on April 21. This is to look at his venous system vis--vis continued breakdown of the wounds on the left including the left lateral leg and left dorsal foot he. He has had previous ablations on this side 4/5; the area between the right first and second toes lateral aspect of the first toe looks better. Dorsal aspect of the left first toe on the left foot also improved. Unfortunately the left lateral lower leg  is larger and there is [name] second satellite wound superiorly. The usual superficial abrasions on the left buttock overall better but certainly not closed 4/12; the area between the right first and second toes is improved. Dorsal aspect of the left foot also slightly smaller with [name] vibrant healthy looking surface. No real change in the left lateral leg and the left buttock wound is healed He has an unaffordable co-pay for Apligraf. Appointment with vein and vascular with regards to the left leg venous part of the circulation is on 4/21 4/19; we continue to see improvement in all wound areas. Although this is minor. He has his vascular appointment on 4/21. The area on the left buttock has not reopened although right in the center of this area the skin looks somewhat threatened 4/26; the left buttock is unfortunately reopened. In general his left dorsal foot has [name] healthy surface and looks somewhat smaller although it was not measured as such. The area between his first and second toe webspace on the right as [name] small wound against the first toe. The patient saw vascular surgery. The real question I was asking was about the small saphenous vein on the left. He has previously ablated left greater saphenous vein. Nothing further was commented on on the left. Right greater saphenous vein without reflux at the saphenofemoral junction or proximal thigh there was no indication for ablation of the right greater saphenous vein duplex was negative for DVT bilaterally. They did not think there was anything from [name] vascular surgery point of view that could be offered. They ABIs within normal limits 5/3; only small open area on the left buttock. The area on the left lateral leg which was his original venous reflux is now 2 wounds both which look clean. We are using Iodoflex on the left dorsal foot which looks healthy and smaller. He is down to [name] very tiny area between the right first and second toes, using  silver alginate 5/10; all of his wounds appear better. We have much better edema control in 4 layer compression on the left. This may be the factor that is allowing the left foot and left lateral calf to heal. He has external compression garments at home 04/14/20-All of his wounds are progressing well, the left forefoot is practically closed, left ischium appears to be about the same, right toe webspace is also smaller. The left lateral leg is about the same, continue using Hydrofera Blue to this, silver alginate to the ischium, Iodoflex to the toe space on the right 6/7; most of his wounds outside of the left buttock are doing well. The area on the left lateral calf and left  dorsal foot are smaller. The area on the right foot in between the first and second toe webspace is barely visible although he still says there is some drainage here is the only reason I did not heal this out. Unfortunately the area on the left buttock almost looks like he has [name] skin tear from tape. He has open wound and then [name] large flap of skin that we are trying to get adherence over an area just next to the remaining wound 6/21; 2 week follow-up. I believe is been here for nurse visits. Miraculously the area between his first and second toes on the left dorsal foot is closed over. Still open on the right first second web space. The left lateral calf has 2 open areas. Distally this is more superficial. The proximal area had [name] little more depth and required debridement of adherent necrotic material. His buttock wound is actually larger we have been using silver alginate here 6/28; the patient's area on the left foot remains closed. Still open wet area between the first and second toes on the right and also extending into the plantar aspect. We have been using silver alginate in this location. He has 2 areas on the left lower leg part of his original long wounds which I think are better. We have been using Hydrofera Blue here.  Hydrofera Blue to the left buttock which is stable 7/12; left foot remains closed. Left ankle is closed. May be [name] small area between his right first and second toes the only truly open area is on the left buttock. We have been using Hydrofera Blue here 7/19; patient arrives with marked deterioration especially in the left foot and ankle. We did not put him in [name] compression wrap on the left last week in fact he wore his juxta lite stockings on either side although he does not have an underlying stocking. He has [name] reopening on the left dorsal foot, left lateral ankle and [name] new area on the right dorsal ankle. More worrisome is the degree of erythema on the left foot extending on the lateral foot into the lateral lower leg on the left 7/26; the patient had erythema and drainage from the lateral left ankle last week. Culture of this grew MRSA resistant to doxycycline and clindamycin which are the 2 antibiotics we usually use with this patient who has multiple antibiotic allergies including linezolid, trimethoprim sulfamethoxazole. I had give him an empiric doxycycline and he comes in the area certainly looks somewhat better although it is blotchy in his lower leg. He has not been systemically unwell. He has had areas on the left dorsal foot which is [name] reopening, chronic wounds on the left lateral ankle. Both of these I think are secondary to chronic venous insufficiency. The area between his first and second toes is closed as far as I can tell. He had [name] new wrap injury on the right dorsal ankle last week. Finally he has an area on the left buttock. We have been using silver alginate to everything except the left buttock we are using Hydrofera Blue 06/30/20-Patient returns at 1 week, has been given [name] sample dose pack of NUZYRA which is [name] tetracycline derivative [omadacycline], patient has completed those, we have been using silver alginate to almost all the wounds except the left ischium where we are using  Hydrofera Blue all of them look better 8/16; since I last saw the patient he has been doing well. The area on the left buttock, left lateral  ankle and left foot are all closed today. He has completed the [place] I gave him last time and tolerated this well. He still has open areas on the right dorsal ankle and in the right first second toe area which we are using silver alginate. 8/23; we put him in his bilateral external compression stockings last week as he did not have anything open on either leg except for concerning area between the right first and second toe. He comes in today with an area on the left dorsal foot slightly more proximal than the original wound, the left lateral foot but this is actually [name] continuation of the area he had on the left lateral ankle from last time. As well he is opened up on the left buttock again. 8/30; comes in today with things looking [name] lot better. The area on the left lower ankle has closed down as has the left foot but with eschar in both areas. The area on the dorsal right ankle is also epithelialized. Very little remaining of the left buttock wound. We have been using silver alginate on all wound areas 9/13; the area in the first second toe webspace on the right has fully epithelialized. He still has some vulnerable epithelium on the right and the ankle and the dorsal foot. He notes weeping. He is using his juxta lite stocking. On the left again the left dorsal foot is closed left lateral ankle is closed. We went to the juxta lite stocking here as well. Still vulnerable in the left buttock although only 2 small open areas remain here 9/27; 2-week follow-up. We did not look at his left leg but the patient says everything is closed. He is [name] bit disturbed by the amount of edema in his left foot he is using juxta lite stockings but asking about over the toes stockings which would be 30/40, will talk to him next time. According to him there is no open wound on  either the left foot or the left ankle/calf He has an open area on the dorsal right calf which I initially point [name] wrap injury. He has superficial remaining wound on the left ischial tuberosity been using silver alginate although he says this sticks to the wound 10/5; we gave him 2-week follow-up but he called yesterday expressing some concerns about his right foot right ankle and the left buttock. He came in early. There is still no open areas on the left leg and that still in his juxta lite stocking 10/11; he only has 1 small area on the left buttock that remains measuring millimeters 1 mm. Still has the same irritated skin in this area. We recommended zinc oxide when this eventually closes and pressure relief is meticulously is he can do this. He still has an area on the dorsal part of his right first through third toes which is [name] bit irritated and still open and on the dorsal ankle near the crease of the ankle. We have been using silver alginate and using his own stocking. He has nothing open on the left leg or foot 10/25; 2-week follow-up. Not nearly as good on the left buttock as I was hoping. For open areas with 5 looking threatened small. He has the erythematous irritated chronic skin in this area. 1 area on the right dorsal ankle. He reports this area bleeds easily Right dorsal foot just proximal to the base of his toes We have been using silver alginate. 11/8; 2-week follow-up. Left buttock is about the same  although I do not think the wounds are in the same location we have been using silver alginate. I have asked him to use zinc oxide on the skin around the wounds. He still has [name] small area on the right dorsal ankle he reports this bleeds easily Right dorsal foot just proximal to the base of the toes does not have anything open although the skin is very dry and scaly He has [name] new opening on the nailbed of the left great toe. Nothing on the left ankle 11/29; 3-week follow-up. Left  buttock has 2 open areas. And washing of these wounds today started bleeding easily. Suggesting very friable tissue. We have been using silver alginate. Right dorsal ankle which I thought was initially [name] wrap injury we have been using silver alginate. Nothing open between the toes that I can see. He states the area on the left dorsal toe nailbed healed after the last visit in 2 or 3 days 12/13; 3-week follow-up. His left buttock now has 3 open areas but the original 2 areas are smaller using polymen here. Surrounding skin looks better. The right dorsal ankle is closed. He has [name] small opening on the right dorsal foot at the level of the third toe. In general the skin looks better here. He is wearing his juxta lite stocking on the left leg says there is nothing open 11/24/2020; 3 [name] follow-up. His left buttock still has the 3 open areas. We have been using polymen but due to lack of response he changed to [facility] area. Surrounding skin is dry erythematous and irritated looking. There is no evidence of infection either bacterial or fungal however there is loss of surface epithelium He still has very dry skin in his foot causing irritation and erythema on the dorsal part of his toes. This is not responded to prolonged courses of antifungal simply looks dry and irritated 1/24; left buttock area still looks about the same he was unable to find the triad ointment that we had suggested. The area on the right lower leg just above the dorsal ankle has reopened and the areas on the right foot between the first second and second third toes and scaling on the bottom of the foot has been about the same for quite some time now. been using silver alginate to all wound areas 2/7; left buttock wound looked quite good although not much smaller in terms of surface area surrounding skin looks better. Only [name] few dry flaking areas on the right foot in between the first and second toes the skin generally looks better  here [ammonium lactate]. Finally the area on the right dorsal ankle is closed 2/21; There is no open area on the right foot even between the right first and second toe. Skin around this area dorsally and plantar aspects look better. He has [name] reopening of the area on the right ankle just above the crease of the ankle dorsally. I continue to think that this is probably friction from spasms may be even this time with his stocking under the compression stockings. Wounds on his left buttock look about the same there [name] couple of areas that have reopened. He has [name] total square area of loss of epithelialization. This does not look like infection it looks like [name] contact dermatitis but I just cannot determine to what 3/14; there is nothing on the right foot between the first and second toes this was carefully inspected under illumination. Some chronic irritation on the dorsal part  of his foot from toes 1-3 at the base. Nothing really open here substantially. Still has an area on the right foot/ankle that is actually larger and hyper granulated. His buttock area on the left is just about closed however he has chronic inflammation with loss of the surface epithelial layer Electronic Signature(s) Signed: 02/02/2021 1:09:53 PM By: Linton Ham MD Entered By: Linton Ham on 02/02/2021 08:21:06 -------------------------------------------------------------------------------- Chemical Cauterization Details Patient Name: Date of Service: [name], [name] 02/02/2021 7:30 [name] Medical Record Number: [id number] Patient Account Number: [id number] Date of Birth/Sex: Treating RN: May 13, 1986 (35 y.o. [name] Primary Care Provider: [place], [place] Other Clinician: Referring Provider: Treating Provider/Extender: [name] in Treatment: 265 Procedure Performed for: Wound #50 Right,Dorsal Ankle Performed By: Physician Ricard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 01/27/17; erythema is back to the chronic venous inflammation. The wound on the left leg is somewhat better. I am back to the point where I like to try an Apligraf once again 02/10/17; slight improvement in wound dimensions. Apligraf #2. He is completing his doxycycline 02/14/17; patient arrives today having completed doxycycline last Thursday. This was supposed to be [name] nurse visit however once again he hasn't tense erythema from the medial part of his wound extending over the lower leg. Also erythema in his foot this is roughly in the same distribution as last time. He has baseline chronic venous inflammation however this is [name] lot worse than the baseline I have learned to accept the on him is baseline inflammation 02/24/17- patient is here for follow-up evaluation. He is tolerating compression therapy. His voicing no complaints or concerns he is here anticipating an Apligraf 03/03/17; he arrives today with an adherent necrotic surface. I don't think this is surface is going to be amenable for Apligraf's. The erythema around his wound and on the left dorsal foot has resolved he is off antibiotics 03/10/17; better-looking surface today. I don't think he can tolerate Apligraf's. He tells me he had [name] wound VAC  after [name] skin graft years ago to this area and they had difficulty with [name] seal. The erythema continues to be stable around this some degree of chronic venous inflammation but he also has recurrent cellulitis. We have been using Iodoflex 03/17/17;  continued improvement in the surface and may be small changes in dimensions. Using Iodoflex which seems the only thing that will control his surface 03/24/17- He is here for follow up evaluation of his LLE lateral ulceration and ulcer to right dorsal foot/toe space. He is voicing no complaints or concerns, He is tolerating compression wrap. 03/31/17 arrives today with [name] much healthier looking wound on the left lower extremity. We have been using Iodoflex for [name] prolonged period of time which has for the first time prepared and adequate looking wound bed although we have not had much in the [name] of wound dimension improvement. He also has [name] small wound between the first and second toe on the right 04/07/17; arrives today with [name] healthy-looking wound bed and at least the top 50% of this wound appears to be now her. No debridement was required I have changed him to [facility] last week after prolonged Iodoflex. He did not do well with Apligraf's. We've had [name] re-opening between the first and second toe on the right 04/14/17; arrives today with [name] healthier looking wound bed contractions and the top 50% of this wound and some on the lesser 50%. Wound bed appears healthy. The area between the first and second toe on the right still remains problematic 04/21/17; continued very gradual improvement. Using [facility] 04/28/17; continued very gradual improvement in the left lateral leg venous insufficiency wound. His periwound erythema is very mild. We have been using Hydrofera Blue. Wound is making progress especially in the superior 50% 05/05/17; he continues to have very gradual improvement in the left lateral venous insufficiency wound. Both in terms with an length rings are improving. I debrided this every 2 [name] with #5 curet and we have been using Hydrofera Blue and again making good progress With regards to the wounds between his right first and second toe which I thought might of been tinea pedis he is  not making as much progress very dry scaly skin over the area. Also the area at the base of the left first and second toe in [name] similar condition 05/12/17; continued gradual improvement in the refractory left lateral venous insufficiency wound on the left. Dimension smaller. Surface still requiring debridement using Hydrofera Blue 05/19/17; continued gradual improvement in the refractory left lateral venous ulceration. Careful inspection of the wound bed underlying rumination suggested some degree of epithelialization over the surface no debridement indicated. Continue Hydrofera Blue difficult areas between his toes first and third on the left than first and second on the right. I'[name] going to change to silver alginate from silver collagen. Continue ketoconazole as I suspect underlying tinea pedis 05/26/17; left lateral leg venous insufficiency wound. We've been using Hydrofera Blue. I believe that there is expanding epithelialization over the surface of the wound albeit not coming from the wound circumference. This is [name] bit of an odd situation in which the epithelialization seems to be coming from the surface of the wound rather than in the exact circumference. There is still small open areas mostly along the lateral margin of the wound. ooHe has unchanged areas between the left first and second and the right first second toes which I been treating for tenia pedis 06/02/17; left lateral leg venous insufficiency wound. We  have been using Hydrofera Blue. Somewhat smaller from the wound circumference. The surface of the wound remains [name] bit on it almost epithelialized sedation in appearance. I use an open curette today debridement in the surface of all of this especially the edges ooSmall open wounds remaining on the dorsal right first and second toe interspace and the plantar left first second toe and her face on the left 06/09/17; wound on the left lateral leg continues to be smaller but very gradual and very  dry surface using Hydrofera Blue 06/16/17 requires weekly debridements now on the left lateral leg although this continues to contract. I changed to silver collagen last week because of dryness of the wound bed. Using Iodoflex to the areas on his first and second toes/web space bilaterally 06/24/17; patient with history of paraplegia also chronic venous insufficiency with lymphedema. Has [name] very difficult wound on the left lateral leg. This has been gradually reducing in terms of with but comes in with [name] very dry adherent surface. High switch to silver collagen [name] week or so ago with hydrogel to keep the area moist. This is been refractory to multiple dressing attempts. He also has areas in his first and second toes bilaterally in the anterior and posterior web space. I had been using Iodoflex here after [name] prolonged course of silver alginate with ketoconazole was ineffective [question tinea pedis] 07/14/17; patient arrives today with [name] very difficult adherent material over his left lateral lower leg wound. He also has surrounding erythema and poorly controlled edema. He was switched his Santyl last visit which the nurses are applying once during his doctor visit and once on [name] nurse visit. He was also reduced to 2 layer compression I'[name] not exactly sure of the issue here. 07/21/17; better surface today after 1 week of Iodoflex. Significant cellulitis that we treated last week also better. [Doxycycline] 07/28/17 better surface today with now 2 [name] of Iodoflex. Significant cellulitis treated with doxycycline. He has now completed the doxycycline and he is back to his usual degree of chronic venous inflammation/stasis dermatitis. He reminds me he has had ablations surgery here 08/04/17; continued improvement with Iodoflex to the left lateral leg wound in terms of the surface of the wound although the dimensions are better. He is not currently on any antibiotics, he has the usual degree of chronic venous  inflammation/stasis dermatitis. Problematic areas on the plantar aspect of the first second toe web space on the left and the dorsal aspect of the first second toe web space on the right. At one point I felt these were probably related to chronic fungal infections in treated him aggressively for this although we have not made any improvement here. 08/11/17; left lateral leg. Surface continues to improve with the Iodoflex although we are not seeing much improvement in overall wound dimensions. Areas on his plantar left foot and right foot show no improvement. In fact the right foot looks somewhat worse 08/18/17; left lateral leg. We changed to [facility] Blue last week after [name] prolonged course of Iodoflex which helps get the surface better. It appears that the wound with is improved. Continue with difficult areas on the left dorsal first second and plantar first second on the right 09/01/17; patient arrives in clinic today having had [name] temperature of 103 yesterday. He was seen in the ER and [facility]. The patient was concerned he could have cellulitis again in the right leg however they diagnosed him with [name] UTI and he is now on Keflex. He  has [name] history of cellulitis which is been recurrent and difficult but this is been in the left leg, in the past 5 use doxycycline. He does in and out catheterizations at home which are risk factors for UTI 09/08/17; patient will be completing his Keflex this weekend. The erythema on the left leg is considerably better. He has [name] new wound today on the medial part of the right leg small superficial almost looks like [name] skin tear. He has worsening of the area on the right dorsal first and second toe. His major area on the left lateral leg is better. Using Hydrofera Blue on all areas 09/15/17; gradual reduction in width on the long wound in the left lateral leg. No debridement required. He also has wounds on the plantar aspect of his left first second toe web space and on the  dorsal aspect of the right first second toe web space. 09/22/17; there continues to be very gradual improvements in the dimensions of the left lateral leg wound. He hasn't round erythematous spot with might be pressure on his wheelchair. There is no evidence obviously of infection no purulence no warmth ooHe has [name] dry scaled area on the plantar aspect of the left first second toe ooImproved area on the dorsal right first second toe. 09/29/17; left lateral leg wound continues to improve in dimensions mostly with an is still [name] fairly long but increasingly narrow wound. ooHe has [name] dry scaled area on the plantar aspect of his left first second toe web space ooIncreasingly concerning area on the dorsal right first second toe. In fact I am concerned today about possible cellulitis around this wound. The areas extending up his second toe and although there is deformities here almost appears to abut on the nailbed. 10/06/17; left lateral leg wound continues to make very gradual progress. Tissue culture I did from the right first second toe dorsal foot last time grew MRSA and enterococcus which was vancomycin sensitive. This was not sensitive to clindamycin or doxycycline. He is allergic to Zyvox and sulfa we have therefore arrange for him to have dalvance infusion tomorrow. He is had this in the past and tolerated it well 10/20/17; left lateral leg wound continues to make decent progress. This is certainly reduced in terms of with there is advancing epithelialization.ooThe cellulitis in the right foot looks better although he still has [name] deep wound in the dorsal aspect of the first second toe web space. Plantar left first toe web space on the left I think is making some progress 10/27/17; left lateral leg wound continues to make decent progress. Advancing epithelialization.using Hydrofera Blue ooThe right first second toe web space wound is better-looking using silver alginate ooImprovement in the left  plantar first second toe web space. Again using silver alginate 11/03/17 left lateral leg wound continues to make decent progress albeit slowly. Using Hydrofera Blue ooThe right per second toe web space continues to be [name] very problematic looking punched out wound. I obtained [name] piece of tissue for deep culture I did extensively treated this for fungus. It is difficult to imagine that this is [name] pressure area as the patient states other than going outside he doesn't really wear shoes at home ooThe left plantar first second toe web space looked fairly senescent. Necrotic edges. This required debridement oochange to Hydrofera Blue to all wound areas 11/10/17; left lateral leg wound continues to contract. Using Hydrofera Blue ooOn the right dorsal first second toe web space dorsally. Culture I did of  this area last week grew MRSA there is not an easy oral option in this patient was multiple antibiotic allergies or intolerances. This was only [name] rare culture isolate I'[name] therefore going to use Bactroban under silver alginate ooOn the left plantar first second toe web space. Debridement is required here. This is also unchanged 11/17/17; left lateral leg wound continues to contract using Hydrofera Blue this is no longer the major issue. ooThe major concern here is the right first second toe web space. He now has an open area going from dorsally to the plantar aspect. There is now wound on the inner lateral part of the first toe. Not [name] very viable surface on this. There is erythema spreading medially into the forefoot. ooNo major change in the left first second toe plantar wound 11/24/17; left lateral leg wound continues to contract using Hydrofera Blue. Nice improvement today ooThe right first second toe web space all of this looks [name] lot less angry than last week. I have given him clindamycin and topical Bactroban for MRSA and terbinafine for the possibility of underlining tinea pedis that I could not  control with ketoconazole. Looks somewhat better ooThe area on the plantar left first second toe web space is weeping with dried debris around the wound 12/01/17; left lateral leg wound continues to contract he Hydrofera Blue. It is becoming thinner in terms of with nevertheless it is making good improvement. ooThe right first second toe web space looks less angry but still [name] large necrotic-looking wounds starting on the plantar aspect of the right foot extending between the toes and now extensively on the base of the right second toe. I gave him clindamycin and topical Bactroban for MRSA anterior benefiting for the possibility of underlying tinea pedis. Not looking better today ooThe area on the left first/second toe looks better. Debrided of necrotic debris 12/05/17* the patient was worked in urgently today because over the weekend he found blood on his incontinence bad when he woke up. He was found to have an ulcer by his wife who does most of his wound care. He came in today for [place] to look at this. He has not had [name] history of wounds in his buttocks in spite of his paraplegia. 12/08/17; seen in follow-up today at his usual appointment. He was seen earlier this week and found to have [name] new wound on his buttock. We also follow him for wounds on the left lateral leg, left first second toe web space and right first second toe web space 12/15/17; we have been using Hydrofera Blue to the left lateral leg which has improved. The right first second toe web space has also improved. Left first second toe web space plantar aspect looks stable. The left buttock has worsened using Santyl. Apparently the buttock has drainage 12/22/17; we have been using Hydrofera Blue to the left lateral leg which continues to improve now 2 small wounds separated by normal skin. He tells [place] he had [name] fever up to 100 yesterday he is prone to UTIs but has not noted anything different. He does in and out catheterizations. The area  between the first and second toes today does not look good necrotic surface covered with what looks to be purulent drainage and erythema extending into the third toe. I had gotten this to something that I thought look better last time however it is not look good today. He also has [name] necrotic surface over the buttock wound which is expanded. I thought there might  be infection under here so I removed [name] lot of the surface with [name] #5 curet though nothing look like it really needed culturing. He is been using Santyl to this area 12/27/17; his original wound on the left lateral leg continues to improve using Hydrofera Blue. I gave him samples of Baxdella although he was unable to take them out of fear for an allergic reaction ["lump in his throat"].the culture I did of the purulent drainage from his second toe last week showed both enterococcus and [name] set Enterobacter I was also concerned about the erythema on the bottom of his foot although paradoxically although this looks somewhat better today. 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 We've been using silver alginate here. They came up today that he sleeps in [name] recliner, I'[name] not sure why but I asked him to stop this 01/03/18; his original wound we've been using Hydrofera Blue is now separated into 2 areas. ooUlcer on his left buttock is better he is off the recliner and sleeping in bed ooFinally both wound areas between his first and second toes also looks some better 01/10/18; his original wound on the left lateral leg is now separated into 2 wounds we've been using Hydrofera Blue ooUlcer on his left buttock has some drainage. There is [name] small probing site going into muscle layer superiorly.using silver alginate -He arrives today with [name] deep tissue injury on the left heel ooThe wound on the dorsal aspect of his first second toe on the left looks [name] lot  betterusing silver alginate ketoconazole ooThe area on the first second toe web space on the right also looks [name] lot bette 01/17/18; his original wound on the left lateral leg continues to progress using Hydrofera Blue ooUlcer on his left buttock also is smaller surface healthier except for [name] small probing site going into the muscle layer superiorly. 2.4 cm of tunneling in this area ooDTI on his left heel we have only been offloading. Looks better than last week no threatened open no evidence of infection oothe wound on the dorsal aspect of the first second toe on the left continues to look like it's regressing we have only been using silver alginate and terbinafine orally ooThe area in the first second toe web space on the right also looks to be [name] lot better using silver alginate and terbinafine I think this was prompted by tinea pedis 01/31/18; the patient was hospitalized in [place] last week apparently for [name] complicated UTI. He was discharged on cefepime he does in and out catheterizations. In the hospital he was discovered [name] I don't mild elevation of AST and ALT and the terbinafine was stopped.predictably the pressure ulcer on s his buttock looks betterusing silver alginate. The area on the left lateral leg also is better using Hydrofera Blue. The area between the first and second toes on the left better. First and second toes on the right still substantial but better. Finally the DTI on the left heel has held together and looks like it's resolving 02/07/18-he is here in follow-up evaluation for multiple ulcerations. He has new injury to the lateral aspect of the last issue [name] pressure ulcer, he states this is from adhesive removal trauma. He states he has tried multiple adhesive products with no success. All other ulcers appear stable. The left heel DTI is resolving. We will continue with same treatment plan and follow-up next week. 02/14/18; follow-up  for multiple areas. ooHe has [name] new area  last week on the lateral aspect of his pressure ulcer more over the posterior trochanter. The original pressure ulcer looks quite stable has healthy granulation. 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 Almost fully epithelialized on the original superior area using Hydrofera Blue ooDTI on the left heel has peeled off this week to reveal [name] small superficial wound under denuded skin and subcutaneous tissue ooBoth areas between the first and second toes look better including nothing open on the left 02/21/18; ooThe patient's wounds on his left ischial tuberosity and posterior left greater trochanter actually looked better. He has [name] large area of irritation around the area which I think is contact dermatitis. I am doubtful that this is fungal ooHis original wound on the left lateral calf continues to improve we have been using Hydrofera Blue ooThere is no open area in the left first second toe web space although there is [name] lot of thick callus ooThe DTI on the left heel required debridement today of necrotic surface eschar and subcutaneous tissue using silver alginate ooFinally the area on the right first second toe webspace continues to contract using silver alginate and ketoconazole 02/28/18 ooLeft ischial tuberosity wounds look better using silver alginate. ooOriginal wound on the left calf only has one small open area left using Hydrofera Blue ooDTI on the left heel required debridement mostly removing skin from around this wound surface. Using silver alginate ooThe areas on the right first/second toe web space using silver alginate and ketoconazole 03/08/18 on evaluation today patient appears to be doing decently well as best I can tell in regard to his wounds. This is the first time that I have seen him as he generally is followed by Dr. Dellia Nims. With that being said none of his  wounds appear to be infected he does have an area where there is some skin covering what appears to be [name] new wound on the left dorsal surface of his great toe. This is right at the nail bed. With that being said I do believe that debrided away some of the excess skin can be of benefit in this regard. Otherwise he has been tolerating the dressing changes without complication. 03/14/18; patient arrives today with the multiplicity of wounds that we are following. He has not been systemically unwell ooOriginal wound on the left lateral calf now only has 2 small open areas we've been using Hydrofera Blue which should continue ooThe deep tissue injury on the left heel requires debridement today. We've been using silver alginate ooThe left first second toe and the right first second toe are both are reminiscence what I think was tinea pedis. Apparently some of the callus Surface between the toes was removed last week when it started draining. ooPurulent drainage coming from the wound on the ischial tuberosity on the left. 03/21/18-He is here in follow-up evaluation for multiple wounds. 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 He tolerated debridement. The only change to last week's recommendations is to discontinue antifungal cream between toes. He will follow-up next week 03/28/18; following up for multiple wounds;Concern this week is streaking redness and swelling in the right foot. He is going to need antibiotics for this. 03/31/18; follow-up for right foot cellulitis. Streaking redness and swelling in the right foot on 03/28/18. He has multiple antibiotic intolerances and [name]  history of MRSA. I put him on clindamycin 300 mg every 6 and brought him in for [name] quick check. He has an open wound between his first and second toes on the right foot as [name] potential source. 04/04/18; ooRight foot cellulitis is resolving he is completing  clindamycin. This is truly good news ooLeft lateral calf wound which is initial wound only has one small open area inferiorly this is close to healing out. He has compression stockings. We will use Hydrofera Blue right down to the epithelialization of this ooNonviable surface on the left heel which was initially pressure with [name] DTI. We've been using Hydrofera Blue. I'[name] going to switch this back to silver alginate ooLeft first second toe/tinea pedis this looks better using silver alginate ooRight first second toe tinea pedis using silver alginate ooLarge pressure ulcers on theLeft ischial tuberosity. Small wound here Looks better. I am uncertain about the surface over the large wound. Using silver alginate 04/11/18; ooCellulitis in the right foot is resolved ooLeft lateral calf wound which was his original wounds still has 2 tiny open areas remaining this is just about closed ooNonviable surface on the left heel is better but still requires debridement ooLeft first second toe/tinea pedis still open using silver alginate ooRight first second toe wound tinea pedis I asked him to go back to using ketoconazole and silver alginate ooLarge pressure ulcers on the left ischial tuberosity this shear injury here is resolved. Wound is smaller. No evidence of infection using silver alginate 04/18/18; ooPatient arrives with an intense area of cellulitis in the right mid lower calf extending into the right heel area. Bright red and warm. Smaller area on the left anterior leg. He has [name] significant history of MRSA. He will definitely need antibioticsoodoxycycline ooHe now has 2 open areas on the left ischial tuberosity the original large wound and now [name] satellite area which I think was above his initial satellite areas. Not [name] wonderful surface on this satellite area surrounding erythema which looks like pressure related. ooHis left lateral calf wound again his original wound is just about  closed ooLeft heel pressure injury still requiring debridement ooLeft first second toe looks [name] lot better using silver alginate ooRight first second toe also using silver alginate and ketoconazole cream also looks better 04/20/18; the patient was worked in early today out of concerns with his cellulitis on the right leg. I had started him on doxycycline. This was 2 days ago. His wife was concerned about the swelling in the area. Also concerned about the left buttock. He has not been systemically unwell no fever chills. No nausea vomiting or diarrhea 04/25/18; the patient's left buttock wound is continued to deteriorate he is using Hydrofera Blue. He is still completing clindamycin for the cellulitis on the right leg although all of this looks better. 05/02/18 ooLeft buttock wound still with [name] lot of drainage and [name] very tightly adherent fibrinous necrotic surface. He has [name] deeper area superiorly ooThe left lateral calf wound is still closed ooDTI wound on the left heel necrotic surface especially the circumference using Iodoflex ooAreas between his left first second toe and right first second toe both look better. Dorsally and the right first second toe he had [name] necrotic surface although at smaller. In using silver alginate and ketoconazole. I did [name] culture last week which was [name] deep tissue culture of the reminiscence of the open wound on the right first second toe dorsally. This grew [name] few Acinetobacter and [name] few methicillin-resistant  staph aureus. Nevertheless the area actually this week looked better. I didn't feel the need to specifically address this at least in terms of systemic antibiotics. 05/09/18; wounds are measuring larger more drainage per our intake. We are using Santyl covered with alginate on the large superficial buttock wounds, Iodosorb on the left heel, ketoconazole and silver alginate to the dorsal first and second toes bilaterally. 05/16/18; ooThe area on his left buttock  better in some aspects although the area superiorly over the ischial tuberosity required an extensive debridement.using Santyl ooLeft heel appears stable. Using Iodoflex ooThe areas between his first and second toes are not bad however there is spreading erythema up the dorsal aspect of his left foot this looks like cellulitis again. He is insensate the erythema is really very brilliant.o Erysipelas He went to see an allergist days ago because he was itching part of this he had lab work done. This showed [name] white count of 15.1 with 70% neutrophils. Hemoglobin of 11.4 and [name] platelet count of 659,000. Last white count we had in [facility] was [name] 2-1/2 years ago which was 25.9 but he was ill at the time. He was able to show me some lab work that was done by his primary physician the pattern is about the same. 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 But prompted me to go ahead and do x-rays of both feet and the pelvis rule out osteomyelitis. He also had [name] comprehensive metabolic panel this was reasonably normal his albumin was 3.7 liver function tests BUN/creatinine all normal 05/23/18; x-rays of both his feet from last week were negative for underlying pulmonary abnormality. The x-ray of his pelvis however showed mild irregularity in the left ischial which may represent some early osteomyelitis. The wound in the left ischial continues to get deeper clearly now exposed muscle. Each week necrotic surface material over this area. Whereas the rest of the wounds do not look so bad. ooThe left ischial wound we have been using Santyl and calcium alginate ooT the left heel surface necrotic debris using Iodoflex o ooThe left lateral leg is still healed ooAreas on the left dorsal foot and the right dorsal foot are about the same. There is some inflammation on the left which might represent contact dermatitis, fungal dermatitis I am doubtful cellulitis although this  looks better than last week 05/30/18; CT scan done at Hospital did not show any osteomyelitis or abscess. Suggested the possibility of underlying cellulitis although I don't see [name] lot of evidence of this at the bedside ooThe wound itself on the left buttock/upper thigh actually looks somewhat better. No debridement ooLeft heel also looks better no debridement continue Iodoflex ooBoth dorsal first second toe spaces appear better using Lotrisone. Left still required debridement 06/06/18; ooIntake reported some purulent looking drainage from the left gluteal wound. Using Santyl and calcium alginate ooLeft heel looks better although still [name] nonviable surface requiring debridement ooThe left dorsal foot first/second webspace actually expanding and somewhat deeper. I may consider doing [name] shave biopsy of this area ooRight dorsal foot first/second webspace appears stable to improved. Using Lotrisone and silver alginate to both these areas 06/13/18 ooLeft gluteal surface looks better. Now separated in the 2 wounds. No debridement required. Still drainage. We'll continue silver alginate ooLeft heel continues to look better with Iodoflex continue this for at least another week ooOf his dorsal foot wounds the area on the left still has some depth although it looks  better than last week. We've been using Lotrisone and silver alginate 06/20/18 ooLeft gluteal continues to look better healthy tissue ooLeft heel continues to look better healthy granulation wound is smaller. He is using Iodoflex and his long as this continues continue the Iodoflex ooDorsal right foot looks better unfortunately dorsal left foot does not. There is swelling and erythema of his forefoot. He had minor trauma to this several days ago but doesn't think this was enough to have caused any tissue injury. Foot looks like cellulitis, we have had this problem before 06/27/18 on evaluation today patient appears to be doing [name] little worse  in regard to his foot ulcer. 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 Both of which would really be his only options for treating this infection. In the past he has been given and effusion of Orbactiv. This is done very well for him in the past again it's one time dosing IV antibiotic therapy. Subsequently I do believe this is something we're gonna need to see about doing at this point in time. Currently his other wounds seem to be doing somewhat better in my pinion I'[name] pretty happy in that regard. 07/03/18 on evaluation today patient's wounds actually appear to be doing fairly well. He has been tolerating the dressing changes without complication. All in all he seems to be showing signs of improvement. In regard to the antibiotics he has been dealing with infectious disease since I saw him last week as far as getting this scheduled. In the end [facility]'s [facility] this coming Friday. In the meantime he has been continuing to perform the dressing changes in such as previous. There does not appear to be any evidence of infection worsengin at this time. 07/10/18; ooSince I last saw this man 2 [name] ago things have actually improved. IV antibiotics of resulted in less forefoot erythema although there is still some present. He is not systemically unwell ooLeft buttock wounds o2 now have no depth there is increased epithelialization Using silver alginate ooLeft heel still requires debridement using Iodoflex ooLeft dorsal foot still with [name] sizable wound about the size of [name] border but healthy granulation ooRight dorsal foot still with [name] slitlike area using silver alginate 07/18/18; the patient's cellulitis in the left foot is improved in fact I think it is on its [name] to resolving. ooLeft buttock wounds o2  both look better although the larger one has hypertension granulation we've been using silver alginate ooLeft heel has some thick circumferential redundant skin over the wound edge which will need to be removed today we've been using Iodoflex ooLeft dorsal foot is still [name] sizable wound required debridement using silver alginate ooThe right dorsal foot is just about closed only [name] small open area remains here 07/25/18; left foot cellulitis is resolved ooLeft buttock wounds o2 both look better. Hyper-granulation on the major area ooLeft heel as some debris over the surface but otherwise looks [name] healthier wound. Using silver collagen ooRight dorsal foot is just about closed 07/31/18; arrives with our intake nurse worried about purulent drainage from the buttock. We had hyper-granulation here last week ooHis buttock wounds o2 continue to look better ooLeft heel some debris over the surface but measuring smaller. ooRight dorsal foot unfortunately has openings between the toes ooLeft foot superficial wound looks  less aggravated. 08/07/18 ooButtock wounds continue to look better although some of her granulation and the larger medial wound. silver alginate ooLeft heel continues to look [name] lot better.silver collagen ooLeft foot superficial wound looks less stable. Requires debridement. He has [name] new wound superficial area on the foot on the lateral dorsal foot. ooRight foot looks better using silver alginate without Lotrisone 08/14/2018; patient was in the ER last week diagnosed with [name] UTI. He is now on Cefpodoxime and Macrodantin. ooButtock wounds continued to be smaller. Using silver alginate ooLeft heel continues to look better using silver collagen ooLeft foot superficial wound looks as though it is improving ooRight dorsal foot area is just about healed. 08/21/2018; patient is completed his antibiotics for his UTI. ooHe has 2 open areas on the buttocks. There is still not closed  although the surface looks satisfactory. Using silver alginate ooLeft heel continues to improve using silver collagen ooThe bilateral dorsal foot areas which are at the base of his first and second toes/possible tinea pedis are actually stable on the left but worse on the right. The area on the left required debridement of necrotic surface. After debridement I obtained [name] specimen for PCR culture. ooThe right dorsal foot which is been just about healed last week is now reopened 08/28/2018; culture done on the left dorsal foot showed coag negative staph both staph epidermidis and Lugdunensis. I think this is worthwhile initiating systemic treatment. I will use doxycycline given his long list of allergies. The area on the left heel slightly improved but still requiring debridement. ooThe large wound on the buttock is just about closed whereas the smaller one is larger. Using silver alginate in this area 09/04/2018; patient is completing his doxycycline for the left foot although this continues to be [name] very difficult wound area with very adherent necrotic debris. We are using silver alginate to all his wounds right foot left foot and the small wounds on his buttock, silver collagen on the left heel. 09/11/2018; once again this patient has intense erythema and swelling of the left forefoot. Lesser degrees of erythema in the right foot. He has [name] long list of allergies and intolerances. I will reinstitute doxycycline. 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 Using silver alginate ooLeft heel also looks better using silver collagen ooUnfortunately both the areas on his feet look worse. The area on the left first second webspace is now gone through to the plantar part of his foot. The area on the left foot anteriorly is irritated with erythema and swelling in the forefoot. 09/25/2018 ooHis wound on the left plantar heel looks better. Using silver  collagen ooThe area on the left buttock 2 small remnant areas. One is closed one is still open. Using silver alginate ooThe areas between both his first and second toes look worse. This in spite of long-standing antifungal therapy with ketoconazole and silver alginate which should have antifungal activity ooHe has small areas around his original wound on the left calf one is on the bottom of the original scar tissue and one superiorly both of these are small and superficial but again given wound history in this site this is worrisome 10/02/2018 ooLeft plantar heel continues to gradually contract using silver collagen ooLeft buttock wound is unchanged using silver alginate ooThe areas on his dorsal feet between his first and second toes bilaterally look about the same. I prescribed clindamycin ointment to see if we can address chronic  staph colonization and also the underlying possibility of erythrasma ooThe left lateral lower extremity wound is actually on the lateral part of his ankle. Small open area here. We have been using silver alginate 10/09/2018; ooLeft plantar heel continues to look healthy and contract. No debridement is required ooLeft buttock slightly smaller with [name] tape injury wound just below which was new this week ooDorsal feet somewhat improved I have been using clindamycin ooLeft lateral looks lower extremity the actual open area looks worse although [name] lot of this is epithelialized. I am going to change to silver collagen today He has [name] lot more swelling in the right leg although this is not pitting not red and not particularly warm there is [name] lot of spasm in the right leg usually indicative of people with paralysis of some underlying discomfort. We have reviewed his vascular status from 2017 he had [name] left greater saphenous vein ablation. I wonder about referring him back to vascular surgery if the area on the left leg continues to deteriorate. 10/16/2018 in today  for follow-up and management of multiple lower extremity ulcers. His left Buttock wound is much lower smaller and almost closed completely. The wound to the left ankle has began to reopen with Epithelialization and some adherent slough. He has multiple new areas to the left foot and leg. The left dorsal foot without much improvement. Wound present between left great webspace and 2nd toe. Erythema and edema present right leg. Right LE ultrasound obtained on 10/10/18 was negative for DVT . 10/23/2018; ooLeft buttock is closed over. Still dry macerated skin but there is no open wound. I suspect this is chronic pressure/moisture ooLeft lateral calf is quite [name] bit worse than when I saw this last. There is clearly drainage here he has macerated skin into the left plantar heel. We will change the primary dressing to alginate ooLeft dorsal foot has some improvement in overall wound area. Still using clindamycin and silver alginate ooRight dorsal foot about the same as the left using clindamycin and silver alginate ooThe erythema in the right leg has resolved. He is DVT rule out was negative ooLeft heel pressure area required debridement although the wound is smaller and the surface is health 10/26/2018 ooThe patient came back in for his nurse check today predominantly because of the drainage coming out of the left lateral leg with [name] recent reopening of his original wound on the left lateral calf. He comes in today with [name] large amount of surrounding erythema around the wound extending from the calf into the ankle and even in the area on the dorsal foot. He is not systemically unwell. He is not febrile. Nevertheless this looks like cellulitis. We have been using silver alginate to the area. I changed him to [name] regular visit and I am going to prescribe him doxycycline. The rationale here is [name] long list of medication intolerances and [name] history of MRSA. I did not see anything that I thought would provide [name]  valuable culture 10/30/2018 ooFollow-up from his appointment 4 days ago with really an extensive area of cellulitis in the left calf left lateral ankle and left dorsal foot. I put him on doxycycline. He has [name] long list of medication allergies which are true allergy reactions. 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 In any case he is [name] lot better today. The erythema especially in the anterior and lateral left calf is better. He still has left  ankle erythema. He also is complaining about increasing edema in the right leg we have only been using Kerlix Coban and he has been doing the wraps at home. Finally he has [name] spotty rash on the medial part of his upper left calf which looks like folliculitis or perhaps wrap occlusion type injury. Small superficial macules not pustules 11/06/18 patient arrives today with again [name] considerable degree of erythema around the wound on the left lateral calf extending into the dorsal ankle and dorsal foot. This is [name] lot worse than when I saw this last week. He is on doxycycline really with not [name] lot of improvement. He has not been systemically unwell Wounds on the; left heel actually looks improved. Original area on the left foot and proximity to the first and second toes looks about the same. He has superficial areas on the dorsal foot, anterior calf and then the reopening of his original wound on the left lateral calf which looks about the same ooThe only area he has on the right is the dorsal webspace first and second which is smaller. ooHe has [name] large area of dry erythematous skin on the left buttock small open area here. 11/13/2018; the patient arrives in much better condition. The erythema around the wound on the left lateral calf is [name] lot better. Not sure whether this was the clindamycin or the TCA and ketoconazole or just in the improvement in edema control [stasis dermatitis]. In any case  this is [name] lot better. The area on the left heel is very small and just about resolved using silver collagen we have been using silver alginate to the areas on his dorsal feet 11/20/2018; his wounds include the left lateral calf, left heel, dorsal aspects of both feet just proximal to the first second webspace. He is stable to slightly improved. I did not think any changes to his dressings were going to be necessary 11/27/2018 he has [name] reopening on the left buttock which is surrounded by what looks like tinea or perhaps some other form of dermatitis. The area on the left dorsal foot has some erythema around it I have marked this area but I am not sure whether this is cellulitis or not. Left heel is not closed. Left calf the reopening is really slightly longer and probably worse 1/13; in general things look better and smaller except for the left dorsal foot. Area on the left heel is just about closed, left buttock looks better only [name] small wound remains in the skin looks better [using Lotrisone] 1/20; the area on the left heel only has [name] few remaining open areas here. Left lateral calf about the same in terms of size, left dorsal foot slightly larger right lateral foot still not closed. The area on the left buttock has no open wound and the surrounding skin looks [name] lot better 1/27; the area on the left heel is closed. Left lateral calf better but still requiring extensive debridements. The area on his left buttock is closed. He still has the open areas on the left dorsal foot which is slightly smaller in the right foot which is slightly expanded. We have been using Iodoflex on these areas as well 2/3; left heel is closed. Left lateral calf still requiring debridement using Iodoflex there is no open area on his left buttock however he has dry scaly skin over [name] large area of this. Not really responding well to the Lotrisone. Finally the areas on his dorsal feet at the level of  the first second webspace are  slightly smaller on the right and about the same on the left. Both of these vigorously debrided with Anasept and gauze 2/10; left heel remains closed he has dry erythematous skin over the left buttock but there is no open wound here. Left lateral leg has come in and with. Still requiring debridement we have been using Iodoflex here. Finally the area on the left dorsal foot and right dorsal foot are really about the same extremely dry callused fissured areas. He does not yet have [name] dermatology appointment 2/17; left heel remains closed. He has [name] new open area on the left buttock. The area on the left lateral calf is bigger longer and still covered in necrotic debris. No major change in his foot areas bilaterally. I am awaiting for [name] dermatologist to look on this. We have been using ketoconazole I do not know that this is been doing any good at all. 2/24; left heel remains closed. The left buttock wound that was new reopening last week looks better. The left lateral calf appears better also although still requires debridement. The major area on his foot is the left first second also requiring debridement. We have been putting Prisma on all wounds. I do not believe that the ketoconazole has done too much good for his feet. He will use Lotrisone I am going to give him [name] 2-week course of terbinafine. We still do not have [name] dermatology appointment 3/2 left heel remains closed however there is skin over bone in this area I pointed this out to him today. The left buttock wound is epithelialized but still does not look completely stable. The area on the left leg required debridement were using silver collagen here. With regards to his feet we changed to Lotrisone last week and silver alginate. 3/9; left heel remains closed. Left buttock remains closed. The area on the right foot is essentially closed. The left foot remains unchanged. Slightly smaller on the left lateral calf. Using silver collagen to both of  these areas 3/16-Left heel remains closed. Area on right foot is closed. Left lateral calf above the lateral malleolus open wound requiring debridement with easy bleeding. Left dorsal wound proximal to first toe also debrided. Left ischial area open new. Patient has been using Prisma with wrapping every 3 days. Dermatology appointment is apparently tomorrow.Patient has completed his terbinafine 2-week course with some apparent improvement according to him, there is still flaking and dry skin in his foot on the left 3/23; area on the right foot is reopened. The area on the left anterior foot is about the same still [name] very necrotic adherent surface. He still has the area on the left leg and reopening is on the left buttock. He apparently saw dermatology although I do not have [name] note. According to the patient who is usually fairly well informed they did not have any good ideas. Put him on oral terbinafine which she is been on before. 3/30; using silver collagen to all wounds. Apparently his dermatologist put him on doxycycline and rifampin presumably some culture grew staph. I do not have this result. He remains on terbinafine although I have used terbinafine on him before 4/6; patient has had [name] fairly substantial reopening on the right foot between the first and second toes. He is finished his terbinafine and I believe is on doxycycline and rifampin still as prescribed by dermatology. We have been using silver collagen to all his wounds although the patient reports that he  thinks silver alginate does better on the wounds on his buttock. 4/13; the area on his left lateral calf about the same size but it did not require debridement. ooLeft dorsal foot just proximal to the webspace between the first and second toes is about the same. Still nonviable surface. I note some superficial bronze discoloration of the dorsal part of his foot ooRight dorsal foot just proximal to the first and second toes also  looks about the same. I still think there may be the same discoloration I noted above on the left ooLeft buttock wound looks about the same 4/20; left lateral calf appears to be gradually contracting using silver collagen. 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 The left dorsal foot wound is debrided of tightly adherent necrotic debris and really cleans up quite nicely. The right area is worse with expansion. I did not debride this it is now over the base of the second toe ooThe area on his left buttock is smaller no debridement is required using silver collagen 5/4; left calf continues to make good progress. ooHe arrives with erythema around the wounds on his dorsal foot which even extends to the plantar aspect. Very concerning for coexistent infection. He is finished the erythromycin I gave him for possible erythrasma this does not seem to have helped. ooThe area on the left foot is about the same base of the dorsal toes ooIs area on the buttock looks improved on the left 5/11; left calf and left buttock continued to make good progress. Left foot is about the same to slightly improved. ooMajor problem is on the right foot. He has not had an x-ray. Deep tissue culture I did last week showed both Enterobacter and E. coli. I did not change the doxycycline I put him on empirically although neither 1 of these were plated to doxycycline. He arrives today with the erythema looking worse on both the dorsal and plantar foot. Macerated skin on the bottom of the foot. he has not been systemically unwell 5/18-Patient returns at 1 week, left calf wound appears to be making some progress, left buttock wound appears slightly worse than last time, left foot wound looks slightly better, right foot redness is marginally better. X-ray of both feet show no air or evidence of osteomyelitis. Patient is finished his Omnicef and terbinafine. He  continues to have macerated skin on the bottom of the left foot as well as right 5/26; left calf wound is better, left buttock wound appears to have multiple small superficial open areas with surrounding macerated skin. X-rays that I did last time showed no evidence of osteomyelitis in either foot. He is finished cefdinir and doxycycline. I do not think that he was on terbinafine. He continues to have [name] large superficial open area on the right foot anterior dorsal and slightly between the first and second toes. I did send him to dermatology 2 months ago or so wondering about whether they would do [name] fungal scraping. I do not believe they did but did do [name] culture. We have been using silver alginate to the toe areas, he has been using antifungals at home topically either ketoconazole or Lotrisone. We are using silver collagen on the left foot, silver alginate on the right, silver collagen on the left lateral leg and silver alginate on the left buttock 6/1; left buttock area is healed. We have the left dorsal foot, left lateral leg and right dorsal foot. We are using silver alginate  to the areas on both feet and silver collagen to the area on his left lateral calf 6/8; the left buttock apparently reopened late last week. He is not really sure how this happened. He is tolerating the terbinafine. Using silver alginate to all wounds 6/15; left buttock wound is larger than last week but still superficial. ooCame in the clinic today with [name] report of purulence from the left lateral leg I did not identify any infection ooBoth areas on his dorsal feet appear to be better. He is tolerating the terbinafine. Using silver alginate to all wounds 6/22; left buttock is about the same this week, left calf quite [name] bit better. His left foot is about the same however he comes in with erythema and warmth in the right forefoot once again. Culture that I gave him in the beginning of May showed Enterobacter and E. coli. I  gave him doxycycline and things seem to improve although neither 1 of these organisms was specifically plated. 6/29; left buttock is larger and dry this week. Left lateral calf looks to me to be improved. Left dorsal foot also somewhat improved right foot completely unchanged. The erythema on the right foot is still present. He is completing the Ceftin dinner that I gave him empirically [see discussion above.) 7/6 - All wounds look to be stable and perhaps improved, the left buttock wound is slightly smaller, per patient bleeds easily, completed ceftin, the right foot redness is less, he is on terbinafine 7/13; left buttock wound about the same perhaps slightly narrower. Area on the left lateral leg continues to narrow. Left dorsal foot slightly smaller right foot about the same. We are using silver alginate on the right foot and Hydrofera Blue to the areas on the left. Unna boot on the left 2 layer compression on the right 7/20; left buttock wound absolutely the same. Area on lateral leg continues to get better. Left dorsal foot require debridement as did the right no major change in the 7/27; left buttock wound the same size necrotic debris over the surface. The area on the lateral leg is closed once again. His left foot looks better right foot about the same although there is some involvement now of the posterior first second toe area. He is still on terbinafine which I have given him for [name] month, not certain [name] centimeter major change 06/25/19-All wounds appear to be slightly improved according to report, left buttock wound looks clean, both foot wounds have minimal to no debris the right dorsal foot has minimal slough. We are using Hydrofera Blue to the left and silver alginate to the right foot and ischial wound. 8/10-Wounds all appear to be around the same, the right forefoot distal part has some redness which was not there before, however the wound looks clean and small. Ischial wound looks  about the same with no changes 8/17; his wound on the left lateral calf which was his original chronic venous insufficiency wound remains closed. Since I last saw him the areas on the left dorsal foot right dorsal foot generally appear better but require debridement. The area on his left initial tuberosity appears somewhat larger to me perhaps hyper granulated and bleeds very easily. We have been using Hydrofera Blue to the left dorsal foot and silver alginate to everything else 8/24; left lateral calf remains closed. The areas on his dorsal feet on the webspace of the first and second toes bilaterally both look better. The area on the left buttock which is the  pressure ulcer stage II slightly smaller. I change the dressing to Hydrofera Blue to all areas 8/31; left lateral calf remains closed. The area on his dorsal feet bilaterally look better. Using Hydrofera Blue. Still requiring debridement on the left foot. No change in the left buttock pressure ulcers however 9/14; left lateral calf remains closed. Dorsal feet look quite [name] bit better than 2 [name] ago. Flaking dry skin also [name] lot better with the ammonium lactate I gave him 2 [name] ago. The area on the left buttock is improved. He states that his Roho cushion developed [name] leak and he is getting [name] new one, in the interim he is offloading this vigorously 9/21; left calf remains closed. Left heel which was [name] possible DTI looks better this week. He had macerated tissue around the left dorsal foot right foot looks satisfactory and improved left buttock wound. I changed his dressings to his feet to silver alginate bilaterally. Continuing Hydrofera Blue on the left buttock. 9/28 left calf remains closed. Left heel did not develop anything [possible DTI] dry flaking skin on the left dorsal foot. Right foot looks satisfactory. Improved left buttock wound. We are using silver alginate on his feet Hydrofera Blue on the buttock. I have asked him to go back to  the Lotrisone on his feet including the wounds and surrounding areas 10/5; left calf remains closed. The areas on the left and right feet about the same. [name] lot of this is epithelialized however debris over the remaining open areas. He is using Lotrisone and silver alginate. The area on the left buttock using Hydrofera Blue 10/26. Patient has been out for 3 [name] secondary to Covid concerns. He tested negative but I think his wife tested positive. He comes in today with the left foot substantially worse, right foot about the same. Even more concerning he states that the area on his left buttock closed over but then reopened and is considerably deeper in one aspect than it was before [stage III wound] 11/2; left foot really about the same as last week. Quarter sized wound on the dorsal foot just proximal to the first second toes. Surrounding erythema with areas of denuded epithelium. This is not really much different looking. Did not look like cellulitis this time however. ooRight foot area about the same.. We have been using silver alginate alginate on his toes ooLeft buttock still substantial irritated skin around the wound which I think looks somewhat better. We have been using Hydrofera Blue here. 11/9; left foot larger than last week and [name] very necrotic surface. Right foot I think is about the same perhaps slightly smaller. Debris around the circumference also addressed. Unfortunately on the left buttock there is been [name] decline. Satellite lesions below the major wound distally and now [name] an additional one posteriorly we have been using Hydrofera Blue but I think this is [name] pressure issue 11/16; left foot ulcer dorsally again [name] very adherent necrotic surface. Right foot is about the same. Not much change in the pressure ulcer on his left buttock. 11/30; left foot ulcer dorsally basically the same as when I saw him 2 [name] ago. Very adherent fibrinous debris on the wound surface. Patient reports [name]  lot of drainage as well. 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 We have been using Iodoflex, patient changed back to alginate because of the drainage. Area on his right dorsal foot really looks benign with [name] healthier surface certainly [name] lot better  than on the left. Left buttock wounds all improved using Hydrofera Blue 12/7; left dorsal foot again no improvement. Tightly adherent debris. PCR culture I did last week only showed likely skin contaminant. I have gone ahead and done [name] punch biopsy of this which is about the last thing in terms of investigations I can think to do. He has known venous insufficiency and venous hypertension and this could be the issue here. The area on the right foot is about the same left buttock slightly worse according to our intake nurse secondary to [facility] Blue sticking to the wound 12/14; biopsy of the left foot that I did last time showed changes that could be related to wound healing/chronic stasis dermatitis phenomenon no neoplasm. We have been using silver alginate to both feet. I change the one on the left today to Sorbact and silver alginate to his other 2 wounds 12/28; the patient arrives with the following problems; ooMajor issue is the dorsal left foot which continues to be [name] larger deeper wound area. Still with [name] completely nonviable surface ooParadoxically the area mirror image on the right on the right dorsal foot appears to be getting better. ooHe had some loss of dry denuded skin from the lower part of his original wound on the left lateral calf. Some of this area looked [name] little vulnerable and for this reason we put him in wrap that on this side this week ooThe area on his left buttock is larger. He still has the erythematous circular area which I think is [name] combination of pressure, sweat. This does not look like cellulitis or fungal dermatitis 11/26/2019; -Dorsal left foot large open wound with  depth. Still debris over the surface. Using Sorbact ooThe area on the dorsal right foot paradoxically has closed over [place] [name]. [facility] has [name] reopening on the left ankle laterally at the base of his original wound that extended up into the calf. This appears clean. ooThe left buttock wound is smaller but with very adherent necrotic debris over the surface. We have been using silver alginate here as well The patient had arterial studies done in 2017. He had biphasic waveforms at the dorsalis pedis and posterior tibial bilaterally. ABI in the left was 1.17. Digit waveforms were dampened. He has slight spasticity in the great toes I do not think [name] TBI would be possible 1/11; the patient comes in today with [name] sizable reopening between the first and second toes on the right. This is not exactly in the same location where we have been treating wounds previously. According to our intake nurse this was actually fairly deep but 0.6 cm. The area on the left dorsal foot looks about the same the surface is somewhat cleaner using Sorbact, his MRI is in 2 days. We have not managed yet to get arterial studies. The new reopening on the left lateral calf looks somewhat better using alginate. The left buttock wound is about the same using alginate 1/18; the patient had his ARTERIAL studies which were quite normal. ABI in the right at 1.13 with triphasic/biphasic waveforms on the left ABI 1.06 again with triphasic/biphasic waveforms. It would not have been possible to have done [name] toe brachial index because of spasticity. We have been using Sorbac to the left foot alginate to the rest of his wounds on the right foot left lateral calf and left buttock 1/25; arrives in clinic with erythema and swelling of the left forefoot worse over the first MTP area. This extends laterally dorsally and but  also posteriorly. Still has an area on the left lateral part of the lower part of his calf wound it is eschared and clearly not  closed. ooArea on the left buttock still with surrounding irritation and erythema. ooRight foot surface wound dorsally. The area between the right and first and second toes appears better. 2/1; ooThe left foot wound is about the same. Erythema slightly better I gave him [name] week of doxycycline empirically ooRight foot wound is more extensive extending between the toes to the plantar surface ooLeft lateral calf really no open surface on the inferior part of his original wound however the entire area still looks vulnerable ooAbsolutely no improvement in the left buttock wound required debridement. 2/8; the left foot is about the same. Erythema is slightly improved I gave him clindamycin last week. ooRight foot looks better he is using Lotrimin and silver alginate ooHe has [name] breakdown in the left lateral calf. Denuded epithelium which I have removed ooLeft buttock about the same were using Hydrofera Blue 2/15; left foot is about the same there is less surrounding erythema. Surface still has tightly adherent debris which I have debriding however not making any progress ooRight foot has [name] substantial wound on the medial right second toe between the first and second webspace. ooStill an open area on the left lateral calf distal area. ooButtock wound is about the same 2/22; left foot is about the same less surrounding erythema. Surface has adherent debris. Polymen Ag Right foot area significant wound between the first and second toes. We have been using silver alginate here Left lateral leg polymen Ag at the base of his original venous insufficiency wound ooLeft buttock some improvement here 3/1; ooRight foot is deteriorating in the first second toe webspace. Larger and more substantial. We have been using silver alginate. ooLeft dorsal foot about the same markedly adherent surface debris using PolyMem Ag ooLeft lateral calf surface debris using PolyMem AG ooLeft buttock is improved  again using PolyMem Ag. ooHe is completing his terbinafine. The erythema in the foot seems better. He has been on this for 2 [name] 3/8; no improvement in any wound area in fact he has [name] small open area on the dorsal midfoot which is new this week. He has not gotten his foot x-rays yet 3/15; his x-rays were both negative for osteomyelitis of both feet. No major change in any of his wounds on the extremities however his buttock wounds are better. We have been using polymen on the buttocks, left lower leg. Iodoflex on the left foot and silver alginate on the right 3/22; arrives in clinic today with the 2 major issues are the improvement in the left dorsal foot wound which for once actually looks healthy with [name] nice healthy wound surface without debridement. Using Iodoflex here. Unfortunately on the left lateral calf which is in the distal part of his original wound he came to the clinic here for there was purulent drainage noted some increased breakdown scattered around the original area and [name] small area proximally. We we are using polymen here will change to silver alginate today. His buttock wound on the left is better and I think the area on the right first second toe webspace is also improved 3/29; left dorsal foot looks better. Using Iodoflex. Left ankle culture from deterioration last time grew E. coli, Enterobacter and Enterococcus. I will give him [name] course of cefdinir although that will not cover Enterococcus. The area on the right foot in the webspace of the  first and second toe lateral first toe looks better. The area on his buttock is about healed Vascular appointment is on April 21. This is to look at his venous system vis--vis continued breakdown of the wounds on the left including the left lateral leg and left dorsal foot he. He has had previous ablations on this side 4/5; the area between the right first and second toes lateral aspect of the first toe looks better. Dorsal aspect of the  left first toe on the left foot also improved. Unfortunately the left lateral lower leg is larger and there is [name] second satellite wound superiorly. The usual superficial abrasions on the left buttock overall better but certainly not closed 4/12; the area between the right first and second toes is improved. Dorsal aspect of the left foot also slightly smaller with [name] vibrant healthy looking surface. No real change in the left lateral leg and the left buttock wound is healed He has an unaffordable co-pay for Apligraf. Appointment with vein and vascular with regards to the left leg venous part of the circulation is on 4/21 4/19; we continue to see improvement in all wound areas. Although this is minor. He has his vascular appointment on 4/21. The area on the left buttock has not reopened although right in the center of this area the skin looks somewhat threatened 4/26; the left buttock is unfortunately reopened. In general his left dorsal foot has [name] healthy surface and looks somewhat smaller although it was not measured as such. The area between his first and second toe webspace on the right as [name] small wound against the first toe. The patient saw vascular surgery. The real question I was asking was about the small saphenous vein on the left. He has previously ablated left greater saphenous vein. Nothing further was commented on on the left. Right greater saphenous vein without reflux at the saphenofemoral junction or proximal thigh there was no indication for ablation of the right greater saphenous vein duplex was negative for DVT bilaterally. They did not think there was anything from [name] vascular surgery point of view that could be offered. They ABIs within normal limits 5/3; only small open area on the left buttock. The area on the left lateral leg which was his original venous reflux is now 2 wounds both which look clean. We are using Iodoflex on the left dorsal foot which looks healthy and smaller. He  is down to [name] very tiny area between the right first and second toes, using silver alginate 5/10; all of his wounds appear better. We have much better edema control in 4 layer compression on the left. This may be the factor that is allowing the left foot and left lateral calf to heal. He has external compression garments at home 04/14/20-All of his wounds are progressing well, the left forefoot is practically closed, left ischium appears to be about the same, right toe webspace is also smaller. The left lateral leg is about the same, continue using Hydrofera Blue to this, silver alginate to the ischium, Iodoflex to the toe space on the right 6/7; most of his wounds outside of the left buttock are doing well. The area on the left lateral calf and left dorsal foot are smaller. The area on the right foot in between the first and second toe webspace is barely visible although he still says there is some drainage here is the only reason I did not heal this out. ooUnfortunately the area on the left buttock almost  looks like he has [name] skin tear from tape. He has open wound and then [name] large flap of skin that we are trying to get adherence over an area just next to the remaining wound 6/21; 2 week follow-up. I believe is been here for nurse visits. Miraculously the area between his first and second toes on the left dorsal foot is closed over. Still open on the right first second web space. The left lateral calf has 2 open areas. Distally this is more superficial. The proximal area had [name] little more depth and required debridement of adherent necrotic material. His buttock wound is actually larger we have been using silver alginate here 6/28; the patient's area on the left foot remains closed. Still open wet area between the first and second toes on the right and also extending into the plantar aspect. We have been using silver alginate in this location. He has 2 areas on the left lower leg part of his original  long wounds which I think are better. We have been using Hydrofera Blue here. Hydrofera Blue to the left buttock which is stable 7/12; left foot remains closed. Left ankle is closed. May be [name] small area between his right first and second toes the only truly open area is on the left buttock. We have been using Hydrofera Blue here 7/19; patient arrives with marked deterioration especially in the left foot and ankle. We did not put him in [name] compression wrap on the left last week in fact he wore his juxta lite stockings on either side although he does not have an underlying stocking. He has [name] reopening on the left dorsal foot, left lateral ankle and [name] new area on the right dorsal ankle. More worrisome is the degree of erythema on the left foot extending on the lateral foot into the lateral lower leg on the left 7/26; the patient had erythema and drainage from the lateral left ankle last week. Culture of this grew MRSA resistant to doxycycline and clindamycin which are the 2 antibiotics we usually use with this patient who has multiple antibiotic allergies including linezolid, trimethoprim sulfamethoxazole. I had give him an empiric doxycycline and he comes in the area certainly looks somewhat better although it is blotchy in his lower leg. He has not been systemically unwell. He has had areas on the left dorsal foot which is [name] reopening, chronic wounds on the left lateral ankle. Both of these I think are secondary to chronic venous insufficiency. The area between his first and second toes is closed as far as I can tell. He had [name] new wrap injury on the right dorsal ankle last week. Finally he has an area on the left buttock. We have been using silver alginate to everything except the left buttock we are using Hydrofera Blue 06/30/20-Patient returns at 1 week, has been given [name] sample dose pack of NUZYRA which is [name] tetracycline derivative [omadacycline], patient has completed those, we have been using silver  alginate to almost all the wounds except the left ischium where we are using Hydrofera Blue all of them look better 8/16; since I last saw the patient he has been doing well. The area on the left buttock, left lateral ankle and left foot are all closed today. He has completed the [place] I gave him last time and tolerated this well. He still has open areas on the right dorsal ankle and in the right first second toe area which we are using silver alginate. 8/23; we  put him in his bilateral external compression stockings last week as he did not have anything open on either leg except for concerning area between the right first and second toe. He comes in today with an area on the left dorsal foot slightly more proximal than the original wound, the left lateral foot but this is actually [name] continuation of the area he had on the left lateral ankle from last time. As well he is opened up on the left buttock again. 8/30; comes in today with things looking [name] lot better. The area on the left lower ankle has closed down as has the left foot but with eschar in both areas. The area on the dorsal right ankle is also epithelialized. Very little remaining of the left buttock wound. We have been using silver alginate on all wound areas 9/13; the area in the first second toe webspace on the right has fully epithelialized. He still has some vulnerable epithelium on the right and the ankle and the dorsal foot. He notes weeping. He is using his juxta lite stocking. On the left again the left dorsal foot is closed left lateral ankle is closed. We went to the juxta lite stocking here as well. ooStill vulnerable in the left buttock although only 2 small open areas remain here 9/27; 2-week follow-up. We did not look at his left leg but the patient says everything is closed. He is [name] bit disturbed by the amount of edema in his left foot he is using juxta lite stockings but asking about over the toes stockings which would be  30/40, will talk to him next time. According to him there is no open wound on either the left foot or the left ankle/calf He has an open area on the dorsal right calf which I initially point [name] wrap injury. He has superficial remaining wound on the left ischial tuberosity been using silver alginate although he says this sticks to the wound 10/5; we gave him 2-week follow-up but he called yesterday expressing some concerns about his right foot right ankle and the left buttock. He came in early. There is still no open areas on the left leg and that still in his juxta lite stocking 10/11; he only has 1 small area on the left buttock that remains measuring millimeters 1 mm. Still has the same irritated skin in this area. We recommended zinc oxide when this eventually closes and pressure relief is meticulously is he can do this. He still has an area on the dorsal part of his right first through third toes which is [name] bit irritated and still open and on the dorsal ankle near the crease of the ankle. We have been using silver alginate and using his own stocking. He has nothing open on the left leg or foot 10/25; 2-week follow-up. Not nearly as good on the left buttock as I was hoping. For open areas with 5 looking threatened small. He has the erythematous irritated chronic skin in this area. oo1 area on the right dorsal ankle. He reports this area bleeds easily ooRight dorsal foot just proximal to the base of his toes ooWe have been using silver alginate. 11/8; 2-week follow-up. Left buttock is about the same although I do not think the wounds are in the same location we have been using silver alginate. I have asked him to use zinc oxide on the skin around the wounds. ooHe still has [name] small area on the right dorsal ankle he reports this bleeds easily   ooRight dorsal foot just proximal to the base of the toes does not have anything open although the skin is very dry and scaly ooHe has [name] new opening on  the nailbed of the left great toe. Nothing on the left ankle 11/29; 3-week follow-up. Left buttock has 2 open areas. And washing of these wounds today started bleeding easily. Suggesting very friable tissue. We have been using silver alginate. Right dorsal ankle which I thought was initially [name] wrap injury we have been using silver alginate. Nothing open between the toes that I can see. He states the area on the left dorsal toe nailbed healed after the last visit in 2 or 3 days 12/13; 3-week follow-up. His left buttock now has 3 open areas but the original 2 areas are smaller using polymen here. Surrounding skin looks better. The right dorsal ankle is closed. He has [name] small opening on the right dorsal foot at the level of the third toe. In general the skin looks better here. He is wearing his juxta lite stocking on the left leg says there is nothing open 11/24/2020; 3 [name] follow-up. His left buttock still has the 3 open areas. We have been using polymen but due to lack of response he changed to [facility] area. Surrounding skin is dry erythematous and irritated looking. There is no evidence of infection either bacterial or fungal however there is loss of surface epithelium ooHe still has very dry skin in his foot causing irritation and erythema on the dorsal part of his toes. This is not responded to prolonged courses of antifungal simply looks dry and irritated 1/24; left buttock area still looks about the same he was unable to find the triad ointment that we had suggested. The area on the right lower leg just above the dorsal ankle has reopened and the areas on the right foot between the first second and second third toes and scaling on the bottom of the foot has been about the same for quite some time now. been using silver alginate to all wound areas 2/7; left buttock wound looked quite good although not much smaller in terms of surface area surrounding skin looks better. Only [name] few dry  flaking areas on the right foot in between the first and second toes the skin generally looks better here [ammonium lactate]. Finally the area on the right dorsal ankle is closed 2/21; ooThere is no open area on the right foot even between the right first and second toe. Skin around this area dorsally and plantar aspects look better. ooHe has [name] reopening of the area on the right ankle just above the crease of the ankle dorsally. I continue to think that this is probably friction from spasms may be even this time with his stocking under the compression stockings. ooWounds on his left buttock look about the same there [name] couple of areas that have reopened. He has [name] total square area of loss of epithelialization. This does not look like infection it looks like [name] contact dermatitis but I just cannot determine to what 3/14; there is nothing on the right foot between the first and second toes this was carefully inspected under illumination. Some chronic irritation on the dorsal part of his foot from toes 1-3 at the base. Nothing really open here substantially. Still has an area on the right foot/ankle that is actually larger and hyper granulated. His buttock area on the left is just about closed however he has chronic inflammation with loss of the  surface epithelial layer Objective Constitutional Sitting or standing Blood Pressure is within target range for patient.. Pulse regular and within target range for patient.Marland Kitchen Respirations regular, non-labored and within target range.. Temperature is normal and within the target range for the patient.Marland Kitchen Appears in no distress. Vitals Time Taken: 7:49 AM, Height: 70 in, Source: Stated, Weight: 216 lbs, Source: Stated, BMI: 31, Temperature: 97.9 F, Pulse: 108 bpm, Respiratory Rate: 18 breaths/min, Blood Pressure: 125/83 mmHg. General Notes: Wound exam ooLeft buttock area is considerably better in terms of wound surface area however surrounding skin still looks  irritated with loss of surface epithelium ooArea on the right dorsal ankle is the other remaining wound here I use silver alginate on this to knock down hypergranulation and change the dressing to Hydrofera Blue. ooI do not think there is anything else on his right foot Integumentary (Hair, Skin) Wound #38R status is Healed - Epithelialized. Original cause of wound was Gradually Appeared. The date acquired was: 11/30/2019. The wound has been in treatment 61 [name]. The wound is located on the Right T - Web between 1st and 2nd. The wound measures 0cm length x 0cm width x 0cm depth; 0cm^2 area oe and 0cm^3 volume. The wound is limited to skin breakdown. There is no tunneling or undermining noted. There is [name] none present amount of drainage noted. The wound margin is flat and intact. There is large (67-100%) pink granulation within the wound bed. There is no necrotic tissue within the wound bed. Wound #41R status is Open. Original cause of wound was Gradually Appeared. The date acquired was: 03/16/2020. The wound has been in treatment 46 [name]. The wound is located on the Left Ischium. The wound measures 0.3cm length x 0.3cm width x 0.1cm depth; 0.071cm^2 area and 0.007cm^3 volume. There is Fat Layer (Subcutaneous Tissue) exposed. There is no tunneling or undermining noted. There is [name] small amount of serosanguineous drainage noted. The wound margin is distinct with the outline attached to the wound base. There is large (67-100%) red granulation within the wound bed. There is no necrotic tissue within the wound bed. Wound #49 status is Open. Original cause of wound was Shear/Friction. The date acquired was: 01/12/2021. The wound has been in treatment 3 [name]. The wound is located on the Left,Proximal Ischium. The wound measures 0.2cm length x 1.2cm width x 0.1cm depth; 0.188cm^2 area and 0.019cm^3 volume. There is Fat Layer (Subcutaneous Tissue) exposed. There is no tunneling or undermining noted. There is [name]  small amount of serosanguineous drainage noted. The wound margin is distinct with the outline attached to the wound base. There is large (67-100%) red granulation within the wound bed. There is no necrotic tissue within the wound bed. Wound #50 status is Open. Original cause of wound was Gradually Appeared. The date acquired was: 01/12/2021. The wound has been in treatment 3 [name]. The wound is located on the Right,Dorsal Ankle. The wound measures 2.3cm length x 1.3cm width x 0.1cm depth; 2.348cm^2 area and 0.235cm^3 volume. There is Fat Layer (Subcutaneous Tissue) exposed. There is no tunneling or undermining noted. There is [name] medium amount of serosanguineous drainage noted. The wound margin is flat and intact. There is medium (34-66%) red, friable granulation within the wound bed. There is no necrotic tissue within the wound bed. 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 An Chemical Cauterization procedure was performed by Ricard Dillon., MD. Post procedure Diagnosis Wound #50: Same as Pre-Procedure Notes: Silver Nitrate Plan Follow-up Appointments: Return Appointment in 2 [name]. Bathing/ Shower/ Hygiene: May shower and wash wound with soap and water. - on days that dressing is changed Edema Control - Lymphedema / SCD / Other: Elevate legs to the level of the heart or above for 30 minutes daily and/or when sitting, [name] frequency of: - throughout the day Compression stocking or Garment 30-40 mm/Hg pressure to: - Juxtalite to both legs daily Off-Loading: Roho cushion for wheelchair Turn and reposition every 2 hours WOUND #41R: - Ischium Wound  Laterality: Left Cleanser: Soap and Water Every Other Day/30 Days Discharge Instructions: May shower and wash wound with dial antibacterial soap and water prior to dressing change. Peri-Wound Care: Triad Hydrophilic Wound Dressing Tube, 6 (oz) (Generic) Every Other Day/30 Days Discharge Instructions: Apply to periwound with each dressing change Prim Dressing: PolyMem Silver Non-Adhesive Dressing, 4.25x4.25 in (Generic) Every Other Day/30 Days ary Discharge Instructions: Apply to wound bed as instructed Secondary Dressing: ComfortFoam Border, 4x4 in (silicone border) Every Other Day/30 Days Discharge Instructions: Apply over primary dressing as directed. WOUND #49: - Ischium Wound Laterality: Left, Proximal Cleanser: Soap and Water Every Other Day/30 Days Discharge Instructions: May shower and wash wound with dial antibacterial soap and water prior to dressing change. Peri-Wound Care: Triad Hydrophilic Wound Dressing Tube, 6 (oz) (Generic) Every Other Day/30 Days Discharge Instructions: Apply to periwound with each dressing change Prim Dressing: PolyMem Silver Non-Adhesive Dressing, 4.25x4.25 in (Generic) Every Other Day/30 Days ary Discharge Instructions: Apply to wound bed as instructed Secondary Dressing: ComfortFoam Border, 4x4 in (silicone border) Every Other Day/30 Days Discharge Instructions: Apply over primary dressing as directed. WOUND #50: - Ankle Wound Laterality: Dorsal, Right Cleanser: Wound Cleanser Every Other Day/30 Days Discharge Instructions: Cleanse the wound with wound cleanser prior to applying [name] clean dressing using gauze sponges, not tissue or cotton balls. Cleanser: Soap and Water Every Other Day/30 Days Discharge Instructions: May shower and wash wound with dial antibacterial soap and water prior to dressing change. Prim Dressing: Hydrofera Blue Ready Foam, 2.5 x2.5 in Every Other Day/30 Days ary Discharge Instructions: Apply to wound bed as instructed Secondary  Dressing: ComfortFoam Border, 4x4 in (silicone border) Every Other Day/30 Days Discharge Instructions: Apply over primary dressing as directed. 1. I change to Hydrofera Blue on the right ankle wound hopefully today with hyper granulation 2. He has severe spasticity in his foot and ankle I wonder if this is the issue here. 3. On the buttock we have continued with polymen Ag to the small open areas that remain surrounding this on the skin I would like to try Coloplast which she will order off [employer]) Signed: 02/02/2021 1:09:53 PM By: Linton Ham MD Entered By: Linton Ham on 02/02/2021 08:23:11 -------------------------------------------------------------------------------- SuperBill Details Patient Name: Date of Service: [name], [name] 02/02/2021 Medical Record Number: [id number] Patient Account Number: [id number] Date of Birth/Sex: Treating RN: [birth year] (35 y.o. 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 Facility Procedures CPT4 Code: [id number] Description: 19914 - CHEM CAUT GRANULATION TISS ICD-10 Diagnosis Description L97.511 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

## 2021-02-05 NOTE — Progress Notes (Signed)
Laramee, STARLIN STEIB ([id number]) Visit Report for 02/02/2021 Arrival Information Details Patient Name: Date of Service: Bouse, [name] 02/02/2021 7:30 A M Medical Record Number: [id number] Patient Account Number: [id number] Date of Birth/Sex: Treating RN: Sep 24, 1986 (35 y.o. [name] Primary Care Diamone Whistler: [name], [name] Other Clinician: Referring Devorah Givhan: Treating Mayeli Bornhorst/Extender: [name]: 12 Visit Information History Since Last Visit Added or deleted any medications: No Patient Arrived: Wheel Chair Any new allergies or adverse reactions: No Arrival Time: 07:48 Had a fall or experienced change in No Accompanied By: self activities of daily living that may affect Transfer Assistance: None risk of falls: Patient Identification Verified: Yes Signs or symptoms of abuse/neglect since last visito No Secondary Verification Process Completed: Yes Hospitalized since last visit: No Patient Requires Transmission-Based Precautions: No Implantable device outside of the clinic excluding No Patient Has Alerts: Yes cellular tissue based products placed in the center Patient Alerts: R ABI = 1.0 since last visit: L ABI = 1.1 Has Dressing in Place as Prescribed: Yes Has Compression in Place as Prescribed: Yes Pain Present Now: No Electronic Signature(s) Signed: 02/02/2021 12:16:34 PM By: Baruch Gouty RN, BSN Entered By: Baruch Gouty on 02/02/2021 07:49:16 -------------------------------------------------------------------------------- Encounter Discharge Information Details Patient Name: Date of Service: [place], [name] 02/02/2021 7:30 A M Medical Record Number: [id number] Patient Account Number: [id number] Date of Birth/Sex: Treating RN: [birth year] (35 y.o. [name] Primary Care Elliannah Wayment: [place], [place] Other Clinician: Referring Saed Hudlow: Treating Kelby Adell/Extender: [name]:  265 Encounter Discharge Information Items Discharge Condition: Stable Ambulatory Status: Wheelchair Discharge Destination: Home Transportation: Private Auto Accompanied By: self Schedule Follow-up Appointment: Yes Clinical Summary of Care: Patient Declined Electronic Signature(s) Signed: 02/02/2021 12:16:34 PM By: Baruch Gouty RN, BSN Entered By: Baruch Gouty on 02/02/2021 08:34:36 -------------------------------------------------------------------------------- Lower Extremity Assessment Details Patient Name: Date of Service: [place], [name] 02/02/2021 7:30 A M Medical Record Number: [id number] Patient Account Number: [id number] Date of Birth/Sex: Treating RN: [birth year] (35 y.o. [name] Primary Care Asim Gersten: [place], [place] Other Clinician: Referring Jordon Kristiansen: Treating Lacrystal Barbe/Extender: [name]: 265 Edema Assessment Assessed: [Left: No] [Right: No] Edema: [Left: Ye] [Right: s] Calf Left: Right: Point of Measurement: 33 cm From Medial Instep 34 cm 32 cm Ankle Left: Right: Point of Measurement: 10 cm From Medial Instep 24 cm 23.5 cm Vascular Assessment Pulses: Dorsalis Pedis Palpable: [Left:Yes] [Right:Yes] Electronic Signature(s) Signed: 02/03/2021 5:54:58 PM By: Baruch Gouty RN, BSN Signed: 02/05/2021 5:54:24 PM By: Levan Hurst RN, BSN Previous Signature: 02/02/2021 12:16:34 PM Version By: Baruch Gouty RN, BSN Entered By: Levan Hurst on 02/03/2021 15:59:18 -------------------------------------------------------------------------------- Multi Wound Chart Details Patient Name: Date of Service: [place], [name] 02/02/2021 7:30 A M Medical Record Number: [id number] Patient Account Number: [id number] Date of Birth/Sex: Treating RN: [birth year] (35 y.o. [name] Primary Care Stephanie Mcglone: [name], [name] Other Clinician: Referring Santiaga Butzin: Treating Lysette Lindenbaum/Extender: [name]  in Treatment: 265 Vital Signs Height(in): 70 Pulse(bpm): 108 Weight(lbs): 216 Blood Pressure(mmHg): 125/83 Body Mass Index(BMI): 31 Temperature(F): 97.9 Respiratory Rate(breaths/min): 18 Photos: [38R:No Photos Right T - Web between 1st and 2nd Left Ischium oe] [41R:No Photos] [49:No Photos Left, Proximal Ischium] Wound Location: [38R:Gradually Appeared] [41R:Gradually Appeared] [49:Shear/Friction] Wounding Event: [38R:Inflammatory] [41R:Pressure Ulcer] [49:Pressure Ulcer] Primary Etiology: [38R:Sleep Apnea, Hypertension, Paraplegia Sleep Apnea, Hypertension, Paraplegia Sleep Apnea, Hypertension, Paraplegia] Comorbid History: [38R:11/30/2019] [41R:03/16/2020] [49:01/12/2021] Date Acquired: [38R:61] [41R:46] [49:3] Weeks of Treatment: [38R:Healed - Epithelialized] [41R:Open] [  49:Open] Wound Status: [38R:Yes] [41R:Yes] [49:No] Wound Recurrence: [38R:No] [41R:Yes] [49:No] Clustered Wound: [38R:N/A] [41R:2] [49:N/A] Clustered Quantity: [38R:0x0x0] [41R:0.3x0.3x0.1] [49:0.2x1.2x0.1] Measurements L x W x D (cm) [38R:0] [41R:0.071] [49:0.188] A (cm) : rea [38R:0] [41R:0.007] [49:0.019] Volume (cm) : [38R:100.00%] [41R:96.10%] [49:88.00%] % Reduction in Area: [38R:100.00%] [41R:96.10%] [49:87.90%] % Reduction in Volume: [38R:Full Thickness Without Exposed] [41R:Category/Stage II] [49:Category/Stage III] Classification: [38R:Support Structures None Present] [41R:Small] [49:Small] Exudate Amount: [38R:N/A] [41R:Serosanguineous] [49:Serosanguineous] Exudate Type: [38R:N/A] [41R:red, brown] [49:red, brown] Exudate Color: [38R:Flat and Intact] [41R:Distinct, outline attached] [49:Distinct, outline attached] Wound Margin: [38R:Large (67-100%)] [41R:Large (67-100%)] [49:Large (67-100%)] Granulation Amount: [38R:Pink] [41R:Red] [49:Red] Granulation Quality: [38R:None Present (0%)] [41R:None Present (0%)] [49:None Present (0%)] Necrotic Amount: [38R:Fascia: No] [41R:Fat Layer (Subcutaneous Tissue):  Yes Fat Layer (Subcutaneous Tissue): Yes] Exposed Structures: [38R:Fat Layer (Subcutaneous Tissue): No Tendon: No Muscle: No Joint: No Bone: No Limited to Skin Breakdown Large (67-100%)] [41R:Fascia: No Tendon: No Muscle: No Joint: No Bone: No Small (1-33%)] [49:Fascia: No Tendon: No Muscle: No Joint: No Bone: No  Medium (34-66%)] Epithelialization: [38R:N/A] [41R:N/A] [49:N/A] Wound Number: 50 N/A N/A Photos: No Photos N/A N/A Right, Dorsal Ankle N/A N/A Wound Location: Gradually Appeared N/A N/A Wounding Event: Venous Leg Ulcer N/A N/A Primary Etiology: Sleep Apnea, Hypertension, Paraplegia N/A N/A Comorbid History: 01/12/2021 N/A N/A Date Acquired: 3 N/A N/A Weeks of Treatment: Open N/A N/A Wound Status: No N/A N/A Wound Recurrence: No N/A N/A Clustered Wound: N/A N/A N/A Clustered Quantity: 2.3x1.3x0.1 N/A N/A Measurements L x W x D (cm) 2.348 N/A N/A A (cm) : rea 0.235 N/A N/A Volume (cm) : -24.60% N/A N/A % Reduction in A rea: 37.70% N/A N/A % Reduction in Volume: Full Thickness Without Exposed N/A N/A Classification: Support Structures Medium N/A N/A Exudate Amount: Serosanguineous N/A N/A Exudate Type: red, brown N/A N/A Exudate Color: Flat and Intact N/A N/A Wound Margin: Medium (34-66%) N/A N/A Granulation Amount: Red, Friable N/A N/A Granulation Quality: None Present (0%) N/A N/A Necrotic Amount: Fat Layer (Subcutaneous Tissue): Yes N/A N/A Exposed Structures: Fascia: No Tendon: No Muscle: No Joint: No Bone: No Small (1-33%) N/A N/A Epithelialization: Chemical Cauterization N/A N/A Procedures Performed: Treatment Notes Electronic Signature(s) Signed: 02/02/2021 1:09:53 PM By: Linton Ham MD Signed: 02/02/2021 6:16:37 PM By: Levan Hurst RN, BSN Entered By: Linton Ham on 02/02/2021 08:19:37 -------------------------------------------------------------------------------- Multi-Disciplinary Care Plan Details Patient  Name: Date of Service: [place], [name] 02/02/2021 7:30 A M Medical Record Number: [id number] Patient Account Number: [id number] Date of Birth/Sex: Treating RN: 1985/12/29 (35 y.o. [name] Primary Care Rhiana Morash: [name], [name] Other Clinician: Referring Kymberlyn Eckford: Treating Shataria Crist/Extender: [name]: [place] reviewed with physician Active Inactive Wound/Skin Impairment Nursing Diagnoses: Impaired tissue integrity Knowledge deficit related to ulceration/compromised skin integrity Goals: Patient/caregiver will verbalize understanding of skin care regimen Date Initiated: 01/05/2016 Target Resolution Date: 03/09/2021 Goal Status: Active Ulcer/skin breakdown will have a volume reduction of 30% by week 4 Date Initiated: 01/05/2016 Date Inactivated: 12/22/2017 Target Resolution Date: 01/19/2018 Unmet Reason: complex wounds, Goal Status: Unmet infection Interventions: Assess patient/caregiver ability to obtain necessary supplies Assess ulceration(s) every visit Provide education on ulcer and skin care Notes: 02/02/21: Complex Care, ongoing. Electronic Signature(s) Signed: 02/02/2021 7:48:04 AM By: Lorrin Jackson Signed: 02/02/2021 6:16:37 PM By: Levan Hurst RN, BSN Entered By: Lorrin Jackson on 02/02/2021 07:48:03 -------------------------------------------------------------------------------- Pain Assessment Details Patient Name: Date of Service: Gair, [name] 02/02/2021 7:30 A M Medical Record Number: [id number] Patient Account Number: [id number]  Date of Birth/Sex: Treating RN: 06/25/1986 (35 y.o. [name] Primary Care Pola Furno: [place], [place] Other Clinician: Referring Mehtaab Mayeda: Treating Nikeia Henkes/Extender: [name]: 265 Active Problems Location of Pain Severity and Description of Pain Patient Has Paino No Site Locations Rate the pain. Current Pain  Level: 0 Pain Management and Medication Current Pain Management: Electronic Signature(s) Signed: 02/02/2021 12:16:34 PM By: Baruch Gouty RN, BSN Entered By: Baruch Gouty on 02/02/2021 07:51:29 -------------------------------------------------------------------------------- Patient/Caregiver Education Details Patient Name: Date of Service: [place], A Viviann Spare 3/14/2022andnbsp7:30 A M Medical Record Number: [id number] Patient Account Number: [id number] Date of Birth/Gender: Treating RN: [birth year] (35 y.o. [name] Primary Care Physician: Janine Limbo Other Clinician: Referring Physician: Treating Physician/Extender: [name]: 55 Education Assessment Education Provided To: Patient Education Topics Provided Offloading: Methods: Explain/Verbal, Printed Responses: State content correctly Wound/Skin Impairment: Methods: Demonstration, Explain/Verbal, Printed Responses: State content correctly Electronic Signature(s) Signed: 02/02/2021 5:53:54 PM By: Lorrin Jackson Entered By: Lorrin Jackson on 02/02/2021 07:48:50 -------------------------------------------------------------------------------- Wound Assessment Details Patient Name: Date of Service: [place], [name] 02/02/2021 7:30 A M Medical Record Number: [id number] Patient Account Number: [id number] Date of Birth/Sex: Treating RN: [birth year] (35 y.o. [name] Primary Care Durwin Davisson: [place], [place] Other Clinician: Referring Wallace Cogliano: Treating Nasirah Sachs/Extender: [name]: 265 Wound Status Wound Number: [id number] Primary Etiology: Inflammatory Wound Location: Right T - Web between 1st and 2nd oe Wound Status: Healed - Epithelialized Wounding Event: Gradually Appeared Comorbid History: Sleep Apnea, Hypertension, Paraplegia Date Acquired: 11/30/2019 Weeks Of Treatment: 61 Clustered Wound: No Wound Measurements Length: (cm) Width:  (cm) Depth: (cm) Area: (cm) Volume: (cm) 0 % Reduction in Area: 100% 0 % Reduction in Volume: 100% 0 Epithelialization: Large (67-100%) 0 Tunneling: No 0 Undermining: No Wound Description Classification: Full Thickness Without Exposed Support Structures Wound Margin: Flat and Intact Exudate Amount: None Present Foul Odor After Cleansing: No Slough/Fibrino No Wound Bed Granulation Amount: Large (67-100%) Exposed Structure Granulation Quality: Pink Fascia Exposed: No Necrotic Amount: None Present (0%) Fat Layer (Subcutaneous Tissue) Exposed: No Tendon Exposed: No Muscle Exposed: No Joint Exposed: No Bone Exposed: No Limited to Skin Breakdown Electronic Signature(s) Signed: 02/02/2021 5:53:54 PM By: Lorrin Jackson Signed: 02/02/2021 6:16:37 PM By: Levan Hurst RN, BSN Entered By: Lorrin Jackson on 02/02/2021 08:14:58 -------------------------------------------------------------------------------- Wound Assessment Details Patient Name: Date of Service: [place]o, [name] 02/02/2021 7:30 A M Medical Record Number: [id number] Patient Account Number: [id number] Date of Birth/Sex: Treating RN: 03/18/1986 (35 y.o. [name] Primary Care Javyn Havlin: [name], [name] Other Clinician: Referring [place]: Treating Rusti Arizmendi/Extender: [name]: 265 Wound Status Wound Number: 41R Primary Etiology: Pressure Ulcer Wound Location: Left Ischium Wound Status: Open Wounding Event: Gradually Appeared Comorbid History: Sleep Apnea, Hypertension, Paraplegia Date Acquired: 03/16/2020 Weeks Of Treatment: 46 Clustered Wound: Yes Photos Wound Measurements Length: (cm) 0.3 Width: (cm) 0.3 Depth: (cm) 0.1 Clustered Quantity: 2 Area: (cm) 0.071 Volume: (cm) 0.007 % Reduction in Area: 96.1% % Reduction in Volume: 96.1% Epithelialization: Small (1-33%) Tunneling: No Undermining: No Wound Description Classification: Category/Stage II Wound  Margin: Distinct, outline attached Exudate Amount: Small Exudate Type: Serosanguineous Exudate Color: red, brown Foul Odor After Cleansing: No Slough/Fibrino Yes Wound Bed Granulation Amount: Large (67-100%) Exposed Structure Granulation Quality: Red Fascia Exposed: No Necrotic Amount: None Present (0%) Fat Layer (Subcutaneous Tissue) Exposed: Yes Tendon Exposed: No Muscle Exposed: No Joint Exposed: No Bone Exposed: No Treatment Notes Wound #41R (Ischium)  Wound Laterality: Left Cleanser Soap and Water Discharge Instruction: May shower and wash wound with dial antibacterial soap and water prior to dressing change. Peri-Wound Care Triad Hydrophilic Wound Dressing Tube, 6 (oz) Discharge Instruction: Apply to periwound with each dressing change Topical Primary Dressing PolyMem Silver Non-Adhesive Dressing, 4.25x4.25 in Discharge Instruction: Apply to wound bed as instructed Secondary Dressing ComfortFoam Border, 4x4 in (silicone border) Discharge Instruction: Apply over primary dressing as directed. Secured With Compression Wrap Compression Stockings Environmental education officer) Signed: 02/02/2021 5:39:15 PM By: Sandre Kitty Signed: 02/03/2021 5:54:58 PM By: Baruch Gouty RN, BSN Previous Signature: 02/02/2021 12:16:34 PM Version By: Baruch Gouty RN, BSN Entered By: Sandre Kitty on 02/02/2021 16:57:12 -------------------------------------------------------------------------------- Wound Assessment Details Patient Name: Date of Service: Sow, [name] 02/02/2021 7:30 A M Medical Record Number: [id number] Patient Account Number: [id number] Date of Birth/Sex: Treating RN: [birth year] (35 y.o. [name] Primary Care Macai Sisneros: [name], [name] Other Clinician: Referring Gauri Galvao: Treating Jeneen [name]/Extender: [name]: 265 Wound Status Wound Number: 49 Primary Etiology: Pressure Ulcer Wound Location: Left, Proximal  Ischium Wound Status: Open Wounding Event: Shear/Friction Comorbid History: Sleep Apnea, Hypertension, Paraplegia Date Acquired: 01/12/2021 Weeks Of Treatment: 3 Clustered Wound: No Photos Wound Measurements Length: (cm) 0.2 Width: (cm) 1.2 Depth: (cm) 0.1 Area: (cm) 0.188 Volume: (cm) 0.019 % Reduction in Area: 88% % Reduction in Volume: 87.9% Epithelialization: Medium (34-66%) Tunneling: No Undermining: No Wound Description Classification: Category/Stage III Wound Margin: Distinct, outline attached Exudate Amount: Small Exudate Type: Serosanguineous Exudate Color: red, brown Foul Odor After Cleansing: No Slough/Fibrino Yes Wound Bed Granulation Amount: Large (67-100%) Exposed Structure Granulation Quality: Red Fascia Exposed: No Necrotic Amount: None Present (0%) Fat Layer (Subcutaneous Tissue) Exposed: Yes Tendon Exposed: No Muscle Exposed: No Joint Exposed: No Bone Exposed: No Treatment Notes Wound #49 (Ischium) Wound Laterality: Left, Proximal Cleanser Soap and Water Discharge Instruction: May shower and wash wound with dial antibacterial soap and water prior to dressing change. Peri-Wound Care Triad Hydrophilic Wound Dressing Tube, 6 (oz) Discharge Instruction: Apply to periwound with each dressing change Topical Primary Dressing PolyMem Silver Non-Adhesive Dressing, 4.25x4.25 in Discharge Instruction: Apply to wound bed as instructed Secondary Dressing ComfortFoam Border, 4x4 in (silicone border) Discharge Instruction: Apply over primary dressing as directed. Secured With Compression Wrap Compression Stockings Environmental education officer) Signed: 02/02/2021 5:39:15 PM By: Sandre Kitty Signed: 02/03/2021 5:54:58 PM By: Baruch Gouty RN, BSN Previous Signature: 02/02/2021 12:16:34 PM Version By: Baruch Gouty RN, BSN Entered By: Sandre Kitty on 02/02/2021  16:58:46 -------------------------------------------------------------------------------- Wound Assessment Details Patient Name: Date of Service: [place], [name] 02/02/2021 7:30 A M Medical Record Number: [id number] Patient Account Number: [id number] Date of Birth/Sex: Treating RN: 01/24/1986 (35 y.o. [name] Primary Care Samantha Olivera: [place], [place] Other Clinician: Referring Ashanna Heinsohn: Treating Ketura Sirek/Extender: [name]: 265 Wound Status Wound Number: 50 Primary Etiology: Venous Leg Ulcer Wound Location: Right, Dorsal Ankle Wound Status: Open Wounding Event: Gradually Appeared Comorbid History: Sleep Apnea, Hypertension, Paraplegia Date Acquired: 01/12/2021 Weeks Of Treatment: 3 Clustered Wound: No Photos Wound Measurements Length: (cm) 2.3 Width: (cm) 1.3 Depth: (cm) 0.1 Area: (cm) 2.348 Volume: (cm) 0.235 % Reduction in Area: -24.6% % Reduction in Volume: 37.7% Epithelialization: Small (1-33%) Tunneling: No Undermining: No Wound Description Classification: Full Thickness Without Exposed Support Structures Wound Margin: Flat and Intact Exudate Amount: Medium Exudate Type: Serosanguineous Exudate Color: red, brown Foul Odor After Cleansing: No Slough/Fibrino Yes Wound Bed Granulation Amount: Medium (34-66%) Exposed  Structure Granulation Quality: Red, Friable Fascia Exposed: No Necrotic Amount: None Present (0%) Fat Layer (Subcutaneous Tissue) Exposed: Yes Tendon Exposed: No Muscle Exposed: No Joint Exposed: No Bone Exposed: No Treatment Notes Wound #50 (Ankle) Wound Laterality: Dorsal, Right Cleanser Wound Cleanser Discharge Instruction: Cleanse the wound with wound cleanser prior to applying a clean dressing using gauze sponges, not tissue or cotton balls. Soap and Water Discharge Instruction: May shower and wash wound with dial antibacterial soap and water prior to dressing change. Peri-Wound  Care Topical Primary Dressing Hydrofera Blue Ready Foam, 2.5 x2.5 in Discharge Instruction: Apply to wound bed as instructed Secondary Dressing ComfortFoam Border, 4x4 in (silicone border) Discharge Instruction: Apply over primary dressing as directed. Secured With Compression Wrap Compression Stockings Environmental education officer) Signed: 02/02/2021 5:39:15 PM By: Sandre Kitty Signed: 02/03/2021 5:54:58 PM By: Baruch Gouty RN, BSN Previous Signature: 02/02/2021 12:16:34 PM Version By: Baruch Gouty RN, BSN Entered By: Sandre Kitty on 02/02/2021 16:54:16 -------------------------------------------------------------------------------- [place] Details Patient Name: Date of Service: [place], [name] 02/02/2021 7:30 A M Medical Record Number: [id number] Patient Account Number: [id number] Date of Birth/Sex: Treating RN: 1986-07-16 (35 y.o. [name] Primary Care Laney Louderback: [place], [place] Other Clinician: Referring Ruhi Kopke: Treating Demba Nigh/Extender: [name]: 265 Vital Signs Time Taken: 07:49 Temperature (F): 97.9 Height (in): 70 Pulse (bpm): 108 Source: Stated Respiratory Rate (breaths/min): 18 Weight (lbs): 216 Blood Pressure (mmHg): 125/83 Source: Stated Reference Range: 80 - 120 mg / dl Body Mass Index (BMI): 31 Electronic Signature(s) Signed: 02/02/2021 12:16:34 PM By: Baruch Gouty RN, BSN Entered By: Baruch Gouty on 02/02/2021 07:51:17

## 2021-02-16 ENCOUNTER — Encounter (HOSPITAL_BASED_OUTPATIENT_CLINIC_OR_DEPARTMENT_OTHER): Payer: 59 | Admitting: Internal Medicine

## 2021-02-16 ENCOUNTER — Other Ambulatory Visit: Payer: Self-pay

## 2021-02-16 DIAGNOSIS — I87332 Chronic venous hypertension (idiopathic) with ulcer and inflammation of left lower extremity: Secondary | ICD-10-CM | POA: Diagnosis not present

## 2021-02-16 NOTE — Progress Notes (Signed)
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 history of paraplegia also chronic venous insufficiency with lymphedema. Has [name] very difficult wound on the left lateral leg. This has been gradually reducing in terms of with but comes in with [name] very dry adherent surface. High switch to silver collagen [name] week or so ago with hydrogel to keep the area moist. This is been refractory to multiple dressing attempts. He also has areas in his first and second toes bilaterally in the anterior and posterior web space. I had been using Iodoflex here after [name] prolonged course of silver alginate with ketoconazole was ineffective [question tinea pedis] 07/14/17; patient arrives today with [name] very difficult adherent material over his left lateral lower leg wound. He also has surrounding erythema and poorly controlled edema. He was switched his Santyl last visit which the nurses are applying once during his doctor visit and once on [name] nurse visit. He was also reduced to 2 layer compression I'[name] not exactly sure of the issue here. 07/21/17; better surface today after 1 week of Iodoflex. Significant cellulitis that we treated last week also better. [Doxycycline] 07/28/17 better surface today with now 2 weeks of Iodoflex. Significant cellulitis treated with doxycycline. He has now completed the doxycycline and he is  back to his usual degree of chronic venous inflammation/stasis dermatitis. He reminds me he has had ablations surgery here 08/04/17; continued improvement with Iodoflex to the left lateral leg wound in terms of the surface of the  wound although the dimensions are better. 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 Problematic areas on the plantar aspect of the first second toe web space on the left and the dorsal aspect of the first second toe web space on the right. At one point I felt these were probably related to chronic fungal infections in treated him aggressively for this although we have not made any improvement here. 08/11/17; left lateral leg. Surface continues to improve with the Iodoflex although we are not seeing much improvement in overall wound dimensions. Areas on his plantar left foot and right foot show no improvement. In fact the right foot looks somewhat worse 08/18/17; left lateral leg. We changed to [facility] Blue last week after [name] prolonged course of Iodoflex which helps get the surface better. It appears that the wound with is improved. Continue with difficult areas on the left dorsal first second and plantar first second on the right 09/01/17; patient arrives in clinic today having had [name] temperature of 103 yesterday. He was seen in the ER and [facility]. The patient was concerned he could have cellulitis again in the right leg however they diagnosed him with [name] UTI and he is now on Keflex. He has [name] history of cellulitis which is been recurrent and difficult but this is been in the left leg, in the past 5 use doxycycline. He does in and out catheterizations at home which are risk factors for UTI 09/08/17; patient will be completing his Keflex this weekend. The erythema on the left leg is considerably better. He has [name] new wound today on the medial part of the right leg small superficial almost looks like [name] skin tear. He has worsening of the area on the right dorsal first and second toe. His major area on the left lateral leg is better. Using Hydrofera Blue on all areas 09/15/17; gradual reduction in width on the long wound in the left  lateral leg. No debridement required. He also has wounds on the plantar aspect of his left first second toe web space and on the dorsal aspect of the right first second toe web space. 09/22/17; there continues to be very gradual improvements in the dimensions of the left lateral leg wound. He hasn't round erythematous spot with might be pressure on his wheelchair. There is no evidence obviously of infection no purulence no warmth He has [name] dry scaled area on the plantar aspect of the left first second toe Improved area on the dorsal right first second toe. 09/29/17; left lateral leg wound continues to improve in dimensions mostly with an is still [name] fairly long but increasingly narrow wound. He has [name] dry scaled area on the plantar aspect of his left first second toe web space Increasingly concerning area on the dorsal right first second toe. In fact I am concerned today about possible cellulitis around this wound. The areas extending up his second toe and although there is deformities here almost appears to abut on the nailbed. 10/06/17; left lateral leg wound continues to make very gradual progress. Tissue culture I did from the right first second toe dorsal foot last time grew MRSA and enterococcus which was vancomycin sensitive. This was not sensitive to clindamycin or doxycycline.  He is allergic to Zyvox and sulfa we have therefore arrange for him to have dalvance infusion tomorrow. He is had this in the past and tolerated it well 10/20/17; left lateral leg wound continues to make decent progress. This is certainly reduced in terms of with there is advancing epithelialization.The cellulitis in the right foot looks better although he still has [name] deep wound in the dorsal aspect of the first second toe web space. Plantar left first toe web space on the left I think is making some progress 10/27/17; left lateral leg wound continues to make decent progress. Advancing epithelialization.using Hydrofera  Blue The right first second toe web space wound is better-looking using silver alginate Improvement in the left plantar first second toe web space. Again using silver alginate 11/03/17 left lateral leg wound continues to make decent progress albeit slowly. Using [facility] The right per second toe web space continues to be [name] very problematic looking punched out wound. I obtained [name] piece of tissue for deep culture I did extensively treated this for fungus. It is difficult to imagine that this is [name] pressure area as the patient states other than going outside he doesn't really wear shoes at home The left plantar first second toe web space looked fairly senescent. Necrotic edges. This required debridement change to [facility] Blue to all wound areas 11/10/17; left lateral leg wound continues to contract. Using Hydrofera Blue On the right dorsal first second toe web space dorsally. Culture I did of this area last week grew MRSA there is not an easy oral option in this patient was multiple antibiotic allergies or intolerances. This was only [name] rare culture isolate I'[name] therefore going to use Bactroban under silver alginate On the left plantar first second toe web space. Debridement is required here. This is also unchanged 11/17/17; left lateral leg wound continues to contract using Hydrofera Blue this is no longer the major issue. The major concern here is the right first second toe web space. He now has an open area going from dorsally to the plantar aspect. There is now wound on the inner lateral part of the first toe. Not [name] very viable surface on this. There is erythema spreading medially into the forefoot. No major change in the left first second toe plantar wound 11/24/17; left lateral leg wound continues to contract using Hydrofera Blue. Nice improvement today The right first second toe web space all of this looks [name] lot less angry than last week. I have given him clindamycin and topical Bactroban  for MRSA and terbinafine for the possibility of underlining tinea pedis that I could not control with ketoconazole. Looks somewhat better The area on the plantar left first second toe web space is weeping with dried debris around the wound 12/01/17; left lateral leg wound continues to contract he Hydrofera Blue. It is becoming thinner in terms of with nevertheless it is making good improvement. The right first second toe web space looks less angry but still [name] large necrotic-looking wounds starting on the plantar aspect of the right foot extending between the toes and now extensively on the base of the right second toe. I gave him clindamycin and topical Bactroban for MRSA anterior benefiting for the possibility of underlying tinea pedis. Not looking better today The area on the left first/second toe looks better. Debrided of necrotic debris 12/05/17* the patient was worked in urgently today because over the weekend he found blood on his incontinence bad when he woke up. He was found  to have an ulcer by his wife who does most of his wound care. He came in today for [place] to look at this. He has not had [name] history of wounds in his buttocks in spite of his paraplegia. 12/08/17; seen in follow-up today at his usual appointment. He was seen earlier this week and found to have [name] new wound on his buttock. We also follow him for wounds on the left lateral leg, left first second toe web space and right first second toe web space 12/15/17; we have been using Hydrofera Blue to the left lateral leg which has improved. The right first second toe web space has also improved. Left first second toe web space plantar aspect looks stable. The left buttock has worsened using Santyl. Apparently the buttock has drainage 12/22/17; we have been using Hydrofera Blue to the left lateral leg which continues to improve now 2 small wounds separated by normal skin. He tells [place] he had [name] fever up to 100 yesterday he is prone to UTIs but  has not noted anything different. He does in and out catheterizations. The area between the first and second toes today does not look good necrotic surface covered with what looks to be purulent drainage and erythema extending into the third toe. I had gotten this to something that I thought look better last time however it is not look good today. He also has [name] necrotic surface over the buttock wound which is expanded. I thought there might be infection under here so I removed [name] lot of the surface with [name] #5 curet though nothing look like it really needed culturing. He is been using Santyl to this area 12/27/17; his original wound on the left lateral leg continues to improve using Hydrofera Blue. I gave him samples of Baxdella although he was unable to take them out of fear for an allergic reaction ["lump in his throat"].the culture I did of the purulent drainage from his second toe last week showed both enterococcus and [name] set Enterobacter I was also concerned about the erythema on the bottom of his foot although paradoxically although this looks somewhat better today. 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 We've been using silver alginate here. They came up today that he sleeps in [name] recliner, I'[name] not sure why but I asked him to stop this 01/03/18; his original wound we've been using Hydrofera Blue is now separated into 2 areas. Ulcer on his left buttock is better he is off the recliner and sleeping in bed Finally both wound areas between his first and second toes also looks some better 01/10/18; his original wound on the left lateral leg is now separated into 2 wounds we've been using Hydrofera Blue Ulcer on his left buttock has some drainage. There is [name] small probing site going into muscle layer superiorly.using silver alginate -He arrives today with [name] deep tissue injury on the left heel The wound on the  dorsal aspect of his first second toe on the left looks [name] lot betterusing silver alginate ketoconazole The area on the first second toe web space on the right also looks [name] lot bette 01/17/18; his original wound on the left lateral leg continues to progress using Hydrofera Blue Ulcer on his left buttock also is smaller surface healthier except for [name] small probing site going into the muscle layer superiorly. 2.4 cm of tunneling in this area DTI on  his left heel we have only been offloading. Looks better than last week no threatened open no evidence of infection the wound on the dorsal aspect of the first second toe on the left continues to look like it's regressing we have only been using silver alginate and terbinafine orally The area in the first second toe web space on the right also looks to be [name] lot better using silver alginate and terbinafine I think this was prompted by tinea pedis 01/31/18; the patient was hospitalized in [place] last week apparently for [name] complicated UTI. He was discharged on cefepime he does in and out catheterizations. In the hospital he was discovered [name] I don't mild elevation of AST and ALT and the terbinafine was stopped.predictably the pressure ulcer on s his buttock looks betterusing silver alginate. The area on the left lateral leg also is better using Hydrofera Blue. The area between the first and second toes on the left better. First and second toes on the right still substantial but better. Finally the DTI on the left heel has held together and looks like it's resolving 02/07/18-he is here in follow-up evaluation for multiple ulcerations. He has new injury to the lateral aspect of the last issue [name] pressure ulcer, he states this is from adhesive removal trauma. He states he has tried multiple adhesive products with no success. All other ulcers appear stable. The left heel DTI is resolving. We will continue with same treatment plan and follow-up next week. 02/14/18;  follow-up for multiple areas. He has [name] new area last week on the lateral aspect of his pressure ulcer more over the posterior trochanter. The original pressure ulcer looks quite stable has healthy granulation. 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 Almost fully epithelialized on the original superior area using Hydrofera Blue DTI on the left heel has peeled off this week to reveal [name] small superficial wound under denuded skin and subcutaneous tissue Both areas between the first and second toes look better including nothing open on the left 02/21/18; The patient's wounds on his left ischial tuberosity and posterior left greater trochanter actually looked better. He has [name] large area of irritation around the area which I think is contact dermatitis. I am doubtful that this is fungal His original wound on the left lateral calf continues to improve we have been using Hydrofera Blue There is no open area in the left first second toe web space although there is [name] lot of thick callus The DTI on the left heel required debridement today of necrotic surface eschar and subcutaneous tissue using silver alginate Finally the area on the right first second toe webspace continues to contract using silver alginate and ketoconazole 02/28/18 Left ischial tuberosity wounds look better using silver alginate. Original wound on the left calf only has one small open area left using Hydrofera Blue DTI on the left heel required debridement mostly removing skin from around this wound surface. Using silver alginate The areas on the right first/second toe web space using silver alginate and ketoconazole 03/08/18 on evaluation today patient appears to be doing decently well as best I can tell in regard to his wounds. This is the first time that I have seen him as he generally is followed by Dr. Dellia Nims. With that being said  none of his wounds appear to be infected he does have an area where there is some skin covering what appears  to be [name] new wound on the left dorsal surface of his great toe. This is right at the nail bed. With that being said I do believe that debrided away some of the excess skin can be of benefit in this regard. Otherwise he has been tolerating the dressing changes without complication. 03/14/18; patient arrives today with the multiplicity of wounds that we are following. He has not been systemically unwell Original wound on the left lateral calf now only has 2 small open areas we've been using Hydrofera Blue which should continue The deep tissue injury on the left heel requires debridement today. We've been using silver alginate The left first second toe and the right first second toe are both are reminiscence what I think was tinea pedis. Apparently some of the callus Surface between the toes was removed last week when it started draining. Purulent drainage coming from the wound on the ischial tuberosity on the left. 03/21/18-He is here in follow-up evaluation for multiple wounds. 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 He tolerated debridement. The only change to last week's recommendations is to discontinue antifungal cream between toes. He will follow-up next week 03/28/18; following up for multiple wounds;Concern this week is streaking redness and swelling in the right foot. He is going to need antibiotics for this. 03/31/18; follow-up for right foot cellulitis. Streaking redness and swelling in the right foot on 03/28/18. 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 I put him on clindamycin 300 mg every 6 and brought him in for [name] quick check. He has an open wound between his first and second toes on the right foot as [name] potential source. 04/04/18; Right foot cellulitis is resolving he is completing  clindamycin. This is truly good news Left lateral calf wound which is initial wound only has one small open area inferiorly this is close to healing out. He has compression stockings. We will use Hydrofera Blue right down to the epithelialization of this Nonviable surface on the left heel which was initially pressure with [name] DTI. We've been using Hydrofera Blue. I'[name] going to switch this back to silver alginate Left first second toe/tinea pedis this looks better using silver alginate Right first second toe tinea pedis using silver alginate Large pressure ulcers on theLeft ischial tuberosity. Small wound here Looks better. I am uncertain about the surface over the large wound. Using silver alginate 04/11/18; Cellulitis in the right foot is resolved Left lateral calf wound which was his original wounds still has 2 tiny open areas remaining this is just about closed Nonviable surface on the left heel is better but still requires debridement Left first second toe/tinea pedis still open using silver alginate Right first second toe wound tinea pedis I asked him to go back to using ketoconazole and silver alginate Large pressure ulcers on the left ischial tuberosity this shear injury here is resolved. Wound is smaller. No evidence of infection using silver alginate 04/18/18; Patient arrives with an intense area of cellulitis in the right mid lower calf extending into the right heel area. Bright red and warm. Smaller area on the left anterior leg. He has [name] significant history of MRSA. He will definitely need antibioticsdoxycycline He now has 2 open areas on the left ischial tuberosity the original large wound and now [name] satellite area which I think was above his initial satellite areas. Not [name] wonderful surface on this satellite area surrounding erythema which looks like  pressure related. His left lateral calf wound again his original wound is just about closed Left heel pressure injury still requiring  debridement Left first second toe looks [name] lot better using silver alginate Right first second toe also using silver alginate and ketoconazole cream also looks better 04/20/18; the patient was worked in early today out of concerns with his cellulitis on the right leg. I had started him on doxycycline. This was 2 days ago. His wife was concerned about the swelling in the area. Also concerned about the left buttock. He has not been systemically unwell no fever chills. No nausea vomiting or diarrhea 04/25/18; the patient's left buttock wound is continued to deteriorate he is using Hydrofera Blue. He is still completing clindamycin for the cellulitis on the right leg although all of this looks better. 05/02/18 Left buttock wound still with [name] lot of drainage and [name] very tightly adherent fibrinous necrotic surface. He has [name] deeper area superiorly The left lateral calf wound is still closed DTI wound on the left heel necrotic surface especially the circumference using Iodoflex Areas between his left first second toe and right first second toe both look better. Dorsally and the right first second toe he had [name] necrotic surface although at smaller. In using silver alginate and ketoconazole. I did [name] culture last week which was [name] deep tissue culture of the reminiscence of the open wound on the right first second toe dorsally. This grew [name] few Acinetobacter and [name] few methicillin-resistant staph aureus. Nevertheless the area actually this week looked better. I didn't feel the need to specifically address this at least in terms of systemic antibiotics. 05/09/18; wounds are measuring larger more drainage per our intake. We are using Santyl covered with alginate on the large superficial buttock wounds, Iodosorb on the left heel, ketoconazole and silver alginate to the dorsal first and second toes bilaterally. 05/16/18; The area on his left buttock better in some aspects although the area superiorly over the ischial  tuberosity required an extensive debridement.using Santyl Left heel appears stable. Using Iodoflex The areas between his first and second toes are not bad however there is spreading erythema up the dorsal aspect of his left foot this looks like cellulitis again. He is insensate the erythema is really very brilliant.o Erysipelas He went to see an allergist days ago because he was itching part of this he had lab work done. This showed [name] white count of 15.1 with 70% neutrophils. Hemoglobin of 11.4 and [name] platelet count of 659,000. Last white count we had in Epic was [name] 2-1/2 years ago which was 25.9 but he was ill at the time. He was able to show me some lab work that was done by his primary physician the pattern is about the same. 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 But prompted me to go ahead and do x-rays of both feet and the pelvis rule out osteomyelitis. He also had [name] comprehensive metabolic panel this was reasonably normal his albumin was 3.7 liver function tests BUN/creatinine all normal 05/23/18; x-rays of both his feet from last week were negative for underlying pulmonary abnormality. The x-ray of his pelvis however showed mild irregularity in the left ischial which may represent some early osteomyelitis. The wound in the left ischial continues to get deeper clearly now exposed muscle. Each week necrotic surface material over this area. Whereas the rest of the wounds do not look so bad. The left ischial  wound we have been using Santyl and calcium alginate T the left heel surface necrotic debris using Iodoflex o The left lateral leg is still healed Areas on the left dorsal foot and the right dorsal foot are about the same. There is some inflammation on the left which might represent contact dermatitis, fungal dermatitis I am doubtful cellulitis although this looks better than last week 05/30/18; CT scan done at Hospital did not show any  osteomyelitis or abscess. Suggested the possibility of underlying cellulitis although I don't see [name] lot of evidence of this at the bedside The wound itself on the left buttock/upper thigh actually looks somewhat better. No debridement Left heel also looks better no debridement continue Iodoflex Both dorsal first second toe spaces appear better using Lotrisone. Left still required debridement 06/06/18; Intake reported some purulent looking drainage from the left gluteal wound. Using Santyl and calcium alginate Left heel looks better although still [name] nonviable surface requiring debridement The left dorsal foot first/second webspace actually expanding and somewhat deeper. I may consider doing [name] shave biopsy of this area Right dorsal foot first/second webspace appears stable to improved. Using Lotrisone and silver alginate to both these areas 06/13/18 Left gluteal surface looks better. Now separated in the 2 wounds. No debridement required. Still drainage. We'll continue silver alginate Left heel continues to look better with Iodoflex continue this for at least another week Of his dorsal foot wounds the area on the left still has some depth although it looks better than last week. We've been using Lotrisone and silver alginate 06/20/18 Left gluteal continues to look better healthy tissue Left heel continues to look better healthy granulation wound is smaller. He is using Iodoflex and his long as this continues continue the Iodoflex Dorsal right foot looks better unfortunately dorsal left foot does not. There is swelling and erythema of his forefoot. He had minor trauma to this several days ago but doesn't think this was enough to have caused any tissue injury. Foot looks like cellulitis, we have had this problem before 06/27/18 on evaluation today patient appears to be doing [name] little worse in regard to his foot ulcer. 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 Both of which would really be his only options for treating this infection. In the past he has been given and effusion of Orbactiv. This is done very well for him in the past again it's one time dosing IV antibiotic therapy. Subsequently I do believe this is something we're gonna need to see about doing at this point in time. Currently his other wounds seem to be doing somewhat better in my pinion I'[name] pretty happy in that regard. 07/03/18 on evaluation today patient's wounds actually appear to be doing fairly well. He has been tolerating the dressing changes without complication. All in all he seems to be showing signs of improvement. In regard to the antibiotics he has been dealing with infectious disease since I saw him last week as far as getting this scheduled. In the end [facility]'s [facility] this coming Friday. In the meantime he has been continuing to perform the dressing changes in such as previous. There does not appear to be any evidence of infection worsengin at this time. 07/10/18; Since I last saw this man 2 weeks ago things have  actually improved. IV antibiotics of resulted in less forefoot erythema although there is still some present. He is not systemically unwell Left buttock wounds 2 now have no depth there is increased epithelialization Using silver alginate Left heel still requires debridement using Iodoflex Left dorsal foot still with [name] sizable wound about the size of [name] border but healthy granulation Right dorsal foot still with [name] slitlike area using silver alginate 07/18/18; the patient's cellulitis in the left foot is improved in fact I think it is on its way to resolving. Left buttock wounds 2 both look better although the larger one has hypertension granulation we've been using silver alginate Left heel has some thick  circumferential redundant skin over the wound edge which will need to be removed today we've been using Iodoflex Left dorsal foot is still [name] sizable wound required debridement using silver alginate The right dorsal foot is just about closed only [name] small open area remains here 07/25/18; left foot cellulitis is resolved Left buttock wounds 2 both look better. Hyper-granulation on the major area Left heel as some debris over the surface but otherwise looks [name] healthier wound. Using silver collagen Right dorsal foot is just about closed 07/31/18; arrives with our intake nurse worried about purulent drainage from the buttock. We had hyper-granulation here last week His buttock wounds 2 continue to look better Left heel some debris over the surface but measuring smaller. Right dorsal foot unfortunately has openings between the toes Left foot superficial wound looks less aggravated. 08/07/18 Buttock wounds continue to look better although some of her granulation and the larger medial wound. silver alginate Left heel continues to look [name] lot better.silver collagen Left foot superficial wound looks less stable. Requires debridement. He has [name] new wound superficial area on the foot on the lateral dorsal foot. Right foot looks better using silver alginate without Lotrisone 08/14/2018; patient was in the ER last week diagnosed with [name] UTI. He is now on Cefpodoxime and Macrodantin. Buttock wounds continued to be smaller. Using silver alginate Left heel continues to look better using silver collagen Left foot superficial wound looks as though it is improving Right dorsal foot area is just about healed. 08/21/2018; patient is completed his antibiotics for his UTI. He has 2 open areas on the buttocks. There is still not closed although the surface looks satisfactory. Using silver alginate Left heel continues to improve using silver collagen The bilateral dorsal foot areas which are at the base of his first and second  toes/possible tinea pedis are actually stable on the left but worse on the right. The area on the left required debridement of necrotic surface. After debridement I obtained [name] specimen for PCR culture. The right dorsal foot which is been just about healed last week is now reopened 08/28/2018; culture done on the left dorsal foot showed coag negative staph both staph epidermidis and Lugdunensis. I think this is worthwhile initiating systemic treatment. I will use doxycycline given his long list of allergies. The area on the left heel slightly improved but still requiring debridement. The large wound on the buttock is just about closed whereas the smaller one is larger. Using silver alginate in this area 09/04/2018; patient is completing his doxycycline for the left foot although this continues to be [name] very difficult wound area with very adherent necrotic debris. We are using silver alginate to all his wounds right foot left foot and the small wounds on his buttock, silver collagen on the left heel. 09/11/2018; once again this  patient has intense erythema and swelling of the left forefoot. Lesser degrees of erythema in the right foot. He has [name] long list of allergies and intolerances. I will reinstitute doxycycline. 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 Using silver alginate Left heel also looks better using silver collagen Unfortunately both the areas on his feet look worse. The area on the left first second webspace is now gone through to the plantar part of his foot. The area on the left foot anteriorly is irritated with erythema and swelling in the forefoot. 09/25/2018 His wound on the left plantar heel looks better. Using silver collagen The area on the left buttock 2 small remnant areas. One is closed one is still open. Using silver alginate The areas between both his first and second toes look worse. This in spite of long-standing antifungal  therapy with ketoconazole and silver alginate which should have antifungal activity He has small areas around his original wound on the left calf one is on the bottom of the original scar tissue and one superiorly both of these are small and superficial but again given wound history in this site this is worrisome 10/02/2018 Left plantar heel continues to gradually contract using silver collagen Left buttock wound is unchanged using silver alginate The areas on his dorsal feet between his first and second toes bilaterally look about the same. I prescribed clindamycin ointment to see if we can address chronic staph colonization and also the underlying possibility of erythrasma The left lateral lower extremity wound is actually on the lateral part of his ankle. Small open area here. We have been using silver alginate 10/09/2018; Left plantar heel continues to look healthy and contract. No debridement is required Left buttock slightly smaller with [name] tape injury wound just below which was new this week Dorsal feet somewhat improved I have been using clindamycin Left lateral looks lower extremity the actual open area looks worse although [name] lot of this is epithelialized. I am going to change to silver collagen today He has [name] lot more swelling in the right leg although this is not pitting not red and not particularly warm there is [name] lot of spasm in the right leg usually indicative of people with paralysis of some underlying discomfort. We have reviewed his vascular status from 2017 he had [name] left greater saphenous vein ablation. I wonder about referring him back to vascular surgery if the area on the left leg continues to deteriorate. 10/16/2018 in today for follow-up and management of multiple lower extremity ulcers. His left Buttock wound is much lower smaller and almost closed completely. The wound to the left ankle has began to reopen with Epithelialization and some adherent slough. He has multiple  new areas to the left foot and leg. The left dorsal foot without much improvement. Wound present between left great webspace and 2nd toe. Erythema and edema present right leg. Right LE ultrasound obtained on 10/10/18 was negative for DVT . 10/23/2018; Left buttock is closed over. Still dry macerated skin but there is no open wound. I suspect this is chronic pressure/moisture Left lateral calf is quite [name] bit worse than when I saw this last. There is clearly drainage here he has macerated skin into the left plantar heel. We will change the primary dressing to alginate Left dorsal foot has some improvement in overall wound area. Still using clindamycin and silver alginate Right dorsal foot about the same as the left  using clindamycin and silver alginate The erythema in the right leg has resolved. He is DVT rule out was negative Left heel pressure area required debridement although the wound is smaller and the surface is health 10/26/2018 The patient came back in for his nurse check today predominantly because of the drainage coming out of the left lateral leg with [name] recent reopening of his original wound on the left lateral calf. He comes in today with [name] large amount of surrounding erythema around the wound extending from the calf into the ankle and even in the area on the dorsal foot. He is not systemically unwell. He is not febrile. Nevertheless this looks like cellulitis. We have been using silver alginate to the area. I changed him to [name] regular visit and I am going to prescribe him doxycycline. The rationale here is [name] long list of medication intolerances and [name] history of MRSA. I did not see anything that I thought would provide [name] valuable culture 10/30/2018 Follow-up from his appointment 4 days ago with really an extensive area of cellulitis in the left calf left lateral ankle and left dorsal foot. I put him on doxycycline. He has [name] long list of medication allergies which are true allergy reactions.  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 In any case he is [name] lot better today. The erythema especially in the anterior and lateral left calf is better. He still has left ankle erythema. He also is complaining about increasing edema in the right leg we have only been using Kerlix Coban and he has been doing the wraps at home. Finally he has [name] spotty rash on the medial part of his upper left calf which looks like folliculitis or perhaps wrap occlusion type injury. Small superficial macules not pustules 11/06/18 patient arrives today with again [name] considerable degree of erythema around the wound on the left lateral calf extending into the dorsal ankle and dorsal foot. This is [name] lot worse than when I saw this last week. He is on doxycycline really with not [name] lot of improvement. He has not been systemically unwell Wounds on the; left heel actually looks improved. Original area on the left foot and proximity to the first and second toes looks about the same. He has superficial areas on the dorsal foot, anterior calf and then the reopening of his original wound on the left lateral calf which looks about the same The only area he has on the right is the dorsal webspace first and second which is smaller. He has [name] large area of dry erythematous skin on the left buttock small open area here. 11/13/2018; the patient arrives in much better condition. The erythema around the wound on the left lateral calf is [name] lot better. Not sure whether this was the clindamycin or the TCA and ketoconazole or just in the improvement in edema control [stasis dermatitis]. In any case this is [name] lot better. The area on the left heel is very small and just about resolved using silver collagen we have been using silver alginate to the areas on his dorsal feet 11/20/2018; his wounds include the left lateral calf, left heel, dorsal aspects of both feet just proximal to  the first second webspace. He is stable to slightly improved. I did not think any changes to his dressings were going to be necessary 11/27/2018 he has [name] reopening on the left buttock which is surrounded by what looks like  tinea or perhaps some other form of dermatitis. The area on the left dorsal foot has some erythema around it I have marked this area but I am not sure whether this is cellulitis or not. Left heel is not closed. Left calf the reopening is really slightly longer and probably worse 1/13; in general things look better and smaller except for the left dorsal foot. Area on the left heel is just about closed, left buttock looks better only [name] small wound remains in the skin looks better [using Lotrisone] 1/20; the area on the left heel only has [name] few remaining open areas here. Left lateral calf about the same in terms of size, left dorsal foot slightly larger right lateral foot still not closed. The area on the left buttock has no open wound and the surrounding skin looks [name] lot better 1/27; the area on the left heel is closed. Left lateral calf better but still requiring extensive debridements. The area on his left buttock is closed. He still has the open areas on the left dorsal foot which is slightly smaller in the right foot which is slightly expanded. We have been using Iodoflex on these areas as well 2/3; left heel is closed. Left lateral calf still requiring debridement using Iodoflex there is no open area on his left buttock however he has dry scaly skin over [name] large area of this. Not really responding well to the Lotrisone. Finally the areas on his dorsal feet at the level of the first second webspace are slightly smaller on the right and about the same on the left. Both of these vigorously debrided with Anasept and gauze 2/10; left heel remains closed he has dry erythematous skin over the left buttock but there is no open wound here. Left lateral leg has come in and with.  Still requiring debridement we have been using Iodoflex here. Finally the area on the left dorsal foot and right dorsal foot are really about the same extremely dry callused fissured areas. He does not yet have [name] dermatology appointment 2/17; left heel remains closed. He has [name] new open area on the left buttock. The area on the left lateral calf is bigger longer and still covered in necrotic debris. No major change in his foot areas bilaterally. I am awaiting for [name] dermatologist to look on this. We have been using ketoconazole I do not know that this is been doing any good at all. 2/24; left heel remains closed. The left buttock wound that was new reopening last week looks better. The left lateral calf appears better also although still requires debridement. The major area on his foot is the left first second also requiring debridement. We have been putting Prisma on all wounds. I do not believe that the ketoconazole has done too much good for his feet. He will use Lotrisone I am going to give him [name] 2-week course of terbinafine. We still do not have [name] dermatology appointment 3/2 left heel remains closed however there is skin over bone in this area I pointed this out to him today. The left buttock wound is epithelialized but still does not look completely stable. The area on the left leg required debridement were using silver collagen here. With regards to his feet we changed to Lotrisone last week and silver alginate. 3/9; left heel remains closed. Left buttock remains closed. The area on the right foot is essentially closed. The left foot remains unchanged. Slightly smaller on the left lateral calf. Using silver collagen  to both of these areas 3/16-Left heel remains closed. Area on right foot is closed. Left lateral calf above the lateral malleolus open wound requiring debridement with easy bleeding. Left dorsal wound proximal to first toe also debrided. Left ischial area open new. Patient has been  using Prisma with wrapping every 3 days. Dermatology appointment is apparently tomorrow.Patient has completed his terbinafine 2-week course with some apparent improvement according to him, there is still flaking and dry skin in his foot on the left 3/23; area on the right foot is reopened. The area on the left anterior foot is about the same still [name] very necrotic adherent surface. He still has the area on the left leg and reopening is on the left buttock. He apparently saw dermatology although I do not have [name] note. According to the patient who is usually fairly well informed they did not have any good ideas. Put him on oral terbinafine which she is been on before. 3/30; using silver collagen to all wounds. Apparently his dermatologist put him on doxycycline and rifampin presumably some culture grew staph. I do not have this result. He remains on terbinafine although I have used terbinafine on him before 4/6; patient has had [name] fairly substantial reopening on the right foot between the first and second toes. He is finished his terbinafine and I believe is on doxycycline and rifampin still as prescribed by dermatology. We have been using silver collagen to all his wounds although the patient reports that he thinks silver alginate does better on the wounds on his buttock. 4/13; the area on his left lateral calf about the same size but it did not require debridement. Left dorsal foot just proximal to the webspace between the first and second toes is about the same. Still nonviable surface. I note some superficial bronze discoloration of the dorsal part of his foot Right dorsal foot just proximal to the first and second toes also looks about the same. I still think there may be the same discoloration I noted above on the left Left buttock wound looks about the same 4/20; left lateral calf appears to be gradually contracting using silver collagen. He remains on erythromycin empiric treatment for possible  erythrasma involving his digital spaces. The left dorsal foot wound is debrided of tightly adherent necrotic debris and really cleans up quite nicely. The right area is worse with expansion. I did not debride this it is now over the base of the second toe The area on his left buttock is smaller no debridement is required using silver collagen 5/4; left calf continues to make good progress. He arrives with erythema around the wounds on his dorsal foot which even extends to the plantar aspect. Very concerning for coexistent infection. He is finished the erythromycin I gave him for possible erythrasma this does not seem to have helped. The area on the left foot is about the same base of the dorsal toes Is area on the buttock looks improved on the left 5/11; left calf and left buttock continued to make good progress. Left foot is about the same to slightly improved. Major problem is on the right foot. He has not had an x-ray. Deep tissue culture I did last week showed both Enterobacter and E. coli. I did not change the doxycycline I put him on empirically although neither 1 of these were plated to doxycycline. He arrives today with the erythema looking worse on both the dorsal and plantar foot. Macerated skin on the bottom of the  foot. he has not been systemically unwell 5/18-Patient returns at 1 week, left calf wound appears to be making some progress, left buttock wound appears slightly worse than last time, left foot wound looks slightly better, right foot redness is marginally better. X-ray of both feet show no air or evidence of osteomyelitis. Patient is finished his Omnicef and terbinafine. He continues to have macerated skin on the bottom of the left foot as well as right 5/26; left calf wound is better, left buttock wound appears to have multiple small superficial open areas with surrounding macerated skin. X-rays that I did last time showed no evidence of osteomyelitis in either foot. He is  finished cefdinir and doxycycline. I do not think that he was on terbinafine. He continues to have [name] large superficial open area on the right foot anterior dorsal and slightly between the first and second toes. I did send him to dermatology 2 months ago or so wondering about whether they would do [name] fungal scraping. I do not believe they did but did do [name] culture. We have been using silver alginate to the toe areas, he has been using antifungals at home topically either ketoconazole or Lotrisone. We are using silver collagen on the left foot, silver alginate on the right, silver collagen on the left lateral leg and silver alginate on the left buttock 6/1; left buttock area is healed. We have the left dorsal foot, left lateral leg and right dorsal foot. We are using silver alginate to the areas on both feet and silver collagen to the area on his left lateral calf 6/8; the left buttock apparently reopened late last week. He is not really sure how this happened. He is tolerating the terbinafine. Using silver alginate to all wounds 6/15; left buttock wound is larger than last week but still superficial. Came in the clinic today with [name] report of purulence from the left lateral leg I did not identify any infection Both areas on his dorsal feet appear to be better. He is tolerating the terbinafine. Using silver alginate to all wounds 6/22; left buttock is about the same this week, left calf quite [name] bit better. His left foot is about the same however he comes in with erythema and warmth in the right forefoot once again. Culture that I gave him in the beginning of May showed Enterobacter and E. coli. I gave him doxycycline and things seem to improve although neither 1 of these organisms was specifically plated. 6/29; left buttock is larger and dry this week. Left lateral calf looks to me to be improved. Left dorsal foot also somewhat improved right foot completely unchanged. The erythema on the right foot is  still present. He is completing the Ceftin dinner that I gave him empirically [see discussion above.) 7/6 - All wounds look to be stable and perhaps improved, the left buttock wound is slightly smaller, per patient bleeds easily, completed ceftin, the right foot redness is less, he is on terbinafine 7/13; left buttock wound about the same perhaps slightly narrower. Area on the left lateral leg continues to narrow. Left dorsal foot slightly smaller right foot about the same. We are using silver alginate on the right foot and Hydrofera Blue to the areas on the left. Unna boot on the left 2 layer compression on the right 7/20; left buttock wound absolutely the same. Area on lateral leg continues to get better. Left dorsal foot require debridement as did the right no major change in the 7/27; left buttock  wound the same size necrotic debris over the surface. The area on the lateral leg is closed once again. His left foot looks better right foot about the same although there is some involvement now of the posterior first second toe area. He is still on terbinafine which I have given him for [name] month, not certain [name] centimeter major change 06/25/19-All wounds appear to be slightly improved according to report, left buttock wound looks clean, both foot wounds have minimal to no debris the right dorsal foot has minimal slough. We are using Hydrofera Blue to the left and silver alginate to the right foot and ischial wound. 8/10-Wounds all appear to be around the same, the right forefoot distal part has some redness which was not there before, however the wound looks clean and small. Ischial wound looks about the same with no changes 8/17; his wound on the left lateral calf which was his original chronic venous insufficiency wound remains closed. Since I last saw him the areas on the left dorsal foot right dorsal foot generally appear better but require debridement. The area on his left initial tuberosity appears  somewhat larger to me perhaps hyper granulated and bleeds very easily. We have been using Hydrofera Blue to the left dorsal foot and silver alginate to everything else 8/24; left lateral calf remains closed. The areas on his dorsal feet on the webspace of the first and second toes bilaterally both look better. The area on the left buttock which is the pressure ulcer stage II slightly smaller. I change the dressing to Hydrofera Blue to all areas 8/31; left lateral calf remains closed. The area on his dorsal feet bilaterally look better. Using Hydrofera Blue. Still requiring debridement on the left foot. No change in the left buttock pressure ulcers however 9/14; left lateral calf remains closed. Dorsal feet look quite [name] bit better than 2 weeks ago. Flaking dry skin also [name] lot better with the ammonium lactate I gave him 2 weeks ago. The area on the left buttock is improved. He states that his Roho cushion developed [name] leak and he is getting [name] new one, in the interim he is offloading this vigorously 9/21; left calf remains closed. Left heel which was [name] possible DTI looks better this week. He had macerated tissue around the left dorsal foot right foot looks satisfactory and improved left buttock wound. I changed his dressings to his feet to silver alginate bilaterally. Continuing Hydrofera Blue on the left buttock. 9/28 left calf remains closed. Left heel did not develop anything [possible DTI] dry flaking skin on the left dorsal foot. Right foot looks satisfactory. Improved left buttock wound. We are using silver alginate on his feet Hydrofera Blue on the buttock. I have asked him to go back to the Lotrisone on his feet including the wounds and surrounding areas 10/5; left calf remains closed. The areas on the left and right feet about the same. [name] lot of this is epithelialized however debris over the remaining open areas. He is using Lotrisone and silver alginate. The area on the left buttock using  Hydrofera Blue 10/26. Patient has been out for 3 weeks secondary to Covid concerns. He tested negative but I think his wife tested positive. He comes in today with the left foot substantially worse, right foot about the same. Even more concerning he states that the area on his left buttock closed over but then reopened and is considerably deeper in one aspect than it was before [stage III wound] 11/2;  left foot really about the same as last week. Quarter sized wound on the dorsal foot just proximal to the first second toes. Surrounding erythema with areas of denuded epithelium. This is not really much different looking. Did not look like cellulitis this time however. Right foot area about the same.. We have been using silver alginate alginate on his toes Left buttock still substantial irritated skin around the wound which I think looks somewhat better. We have been using Hydrofera Blue here. 11/9; left foot larger than last week and [name] very necrotic surface. Right foot I think is about the same perhaps slightly smaller. Debris around the circumference also addressed. Unfortunately on the left buttock there is been [name] decline. Satellite lesions below the major wound distally and now [name] an additional one posteriorly we have been using Hydrofera Blue but I think this is [name] pressure issue 11/16; left foot ulcer dorsally again [name] very adherent necrotic surface. Right foot is about the same. Not much change in the pressure ulcer on his left buttock. 11/30; left foot ulcer dorsally basically the same as when I saw him 2 weeks ago. Very adherent fibrinous debris on the wound surface. Patient reports [name] lot of drainage as well. 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 We have been using Iodoflex, patient changed back to alginate because of the drainage. Area on his right dorsal foot really looks benign with [name] healthier surface certainly [name] lot better than on the left.  Left buttock wounds all improved using Hydrofera Blue 12/7; left dorsal foot again no improvement. Tightly adherent debris. PCR culture I did last week only showed likely skin contaminant. I have gone ahead and done [name] punch biopsy of this which is about the last thing in terms of investigations I can think to do. He has known venous insufficiency and venous hypertension and this could be the issue here. The area on the right foot is about the same left buttock slightly worse according to our intake nurse secondary to [facility] Blue sticking to the wound 12/14; biopsy of the left foot that I did last time showed changes that could be related to wound healing/chronic stasis dermatitis phenomenon no neoplasm. We have been using silver alginate to both feet. I change the one on the left today to Sorbact and silver alginate to his other 2 wounds 12/28; the patient arrives with the following problems; Major issue is the dorsal left foot which continues to be [name] larger deeper wound area. Still with [name] completely nonviable surface Paradoxically the area mirror image on the right on the right dorsal foot appears to be getting better. He had some loss of dry denuded skin from the lower part of his original wound on the left lateral calf. Some of this area looked [name] little vulnerable and for this reason we put him in wrap that on this side this week The area on his left buttock is larger. He still has the erythematous circular area which I think is [name] combination of pressure, sweat. This does not look like cellulitis or fungal dermatitis 11/26/2019; -Dorsal left foot large open wound with depth. Still debris over the surface. Using Sorbact The area on the dorsal right foot paradoxically has closed over He has [name] reopening on the left ankle laterally at the base of his original wound that extended up into the calf. This appears clean. The left buttock wound is smaller but with very adherent necrotic debris over the  surface. We have been using silver alginate here as well The patient had arterial studies done in 2017. He had biphasic waveforms at the dorsalis pedis and posterior tibial bilaterally. ABI in the left was 1.17. Digit waveforms were dampened. He has slight spasticity in the great toes I do not think [name] TBI would be possible 1/11; the patient comes in today with [name] sizable reopening between the first and second toes on the right. This is not exactly in the same location where we have been treating wounds previously. According to our intake nurse this was actually fairly deep but 0.6 cm. The area on the left dorsal foot looks about the same the surface is somewhat cleaner using Sorbact, his MRI is in 2 days. We have not managed yet to get arterial studies. The new reopening on the left lateral calf looks somewhat better using alginate. The left buttock wound is about the same using alginate 1/18; the patient had his ARTERIAL studies which were quite normal. ABI in the right at 1.13 with triphasic/biphasic waveforms on the left ABI 1.06 again with triphasic/biphasic waveforms. It would not have been possible to have done [name] toe brachial index because of spasticity. We have been using Sorbac to the left foot alginate to the rest of his wounds on the right foot left lateral calf and left buttock 1/25; arrives in clinic with erythema and swelling of the left forefoot worse over the first MTP area. This extends laterally dorsally and but also posteriorly. Still has an area on the left lateral part of the lower part of his calf wound it is eschared and clearly not closed. Area on the left buttock still with surrounding irritation and erythema. Right foot surface wound dorsally. The area between the right and first and second toes appears better. 2/1; The left foot wound is about the same. Erythema slightly better I gave him [name] week of doxycycline empirically Right foot wound is more extensive extending between  the toes to the plantar surface Left lateral calf really no open surface on the inferior part of his original wound however the entire area still looks vulnerable Absolutely no improvement in the left buttock wound required debridement. 2/8; the left foot is about the same. Erythema is slightly improved I gave him clindamycin last week. Right foot looks better he is using Lotrimin and silver alginate He has [name] breakdown in the left lateral calf. Denuded epithelium which I have removed Left buttock about the same were using Hydrofera Blue 2/15; left foot is about the same there is less surrounding erythema. Surface still has tightly adherent debris which I have debriding however not making any progress Right foot has [name] substantial wound on the medial right second toe between the first and second webspace. Still an open area on the left lateral calf distal area. Buttock wound is about the same 2/22; left foot is about the same less surrounding erythema. Surface has adherent debris. Polymen Ag Right foot area significant wound between the first and second toes. We have been using silver alginate here Left lateral leg polymen Ag at the base of his original venous insufficiency wound Left buttock some improvement here 3/1; Right foot is deteriorating in the first second toe webspace. Larger and more substantial. We have been using silver alginate. Left dorsal foot about the same markedly adherent surface debris using PolyMem Ag Left lateral calf surface debris using PolyMem AG Left buttock is improved again using PolyMem Ag. He is completing his terbinafine. The  erythema in the foot seems better. He has been on this for 2 weeks 3/8; no improvement in any wound area in fact he has [name] small open area on the dorsal midfoot which is new this week. He has not gotten his foot x-rays yet 3/15; his x-rays were both negative for osteomyelitis of both feet. No major change in any of his wounds on the  extremities however his buttock wounds are better. We have been using polymen on the buttocks, left lower leg. Iodoflex on the left foot and silver alginate on the right 3/22; arrives in clinic today with the 2 major issues are the improvement in the left dorsal foot wound which for once actually looks healthy with [name] nice healthy wound surface without debridement. Using Iodoflex here. Unfortunately on the left lateral calf which is in the distal part of his original wound he came to the clinic here for there was purulent drainage noted some increased breakdown scattered around the original area and [name] small area proximally. We we are using polymen here will change to silver alginate today. His buttock wound on the left is better and I think the area on the right first second toe webspace is also improved 3/29; left dorsal foot looks better. Using Iodoflex. Left ankle culture from deterioration last time grew E. coli, Enterobacter and Enterococcus. I will give him [name] course of cefdinir although that will not cover Enterococcus. The area on the right foot in the webspace of the first and second toe lateral first toe looks better. The area on his buttock is about healed Vascular appointment is on April 21. This is to look at his venous system vis--vis continued breakdown of the wounds on the left including the left lateral leg and left dorsal foot he. He has had previous ablations on this side 4/5; the area between the right first and second toes lateral aspect of the first toe looks better. Dorsal aspect of the left first toe on the left foot also improved. Unfortunately the left lateral lower leg is larger and there is [name] second satellite wound superiorly. The usual superficial abrasions on the left buttock overall better but certainly not closed 4/12; the area between the right first and second toes is improved. Dorsal aspect of the left foot also slightly smaller with [name] vibrant healthy looking  surface. No real change in the left lateral leg and the left buttock wound is healed He has an unaffordable co-pay for Apligraf. Appointment with vein and vascular with regards to the left leg venous part of the circulation is on 4/21 4/19; we continue to see improvement in all wound areas. Although this is minor. He has his vascular appointment on 4/21. The area on the left buttock has not reopened although right in the center of this area the skin looks somewhat threatened 4/26; the left buttock is unfortunately reopened. In general his left dorsal foot has [name] healthy surface and looks somewhat smaller although it was not measured as such. The area between his first and second toe webspace on the right as [name] small wound against the first toe. The patient saw vascular surgery. The real question I was asking was about the small saphenous vein on the left. He has previously ablated left greater saphenous vein. Nothing further was commented on on the left. Right greater saphenous vein without reflux at the saphenofemoral junction or proximal thigh there was no indication for ablation of the right greater saphenous vein duplex was negative for DVT  bilaterally. They did not think there was anything from [name] vascular surgery point of view that could be offered. They ABIs within normal limits 5/3; only small open area on the left buttock. The area on the left lateral leg which was his original venous reflux is now 2 wounds both which look clean. We are using Iodoflex on the left dorsal foot which looks healthy and smaller. He is down to [name] very tiny area between the right first and second toes, using silver alginate 5/10; all of his wounds appear better. We have much better edema control in 4 layer compression on the left. This may be the factor that is allowing the left foot and left lateral calf to heal. He has external compression garments at home 04/14/20-All of his wounds are progressing well, the left  forefoot is practically closed, left ischium appears to be about the same, right toe webspace is also smaller. The left lateral leg is about the same, continue using Hydrofera Blue to this, silver alginate to the ischium, Iodoflex to the toe space on the right 6/7; most of his wounds outside of the left buttock are doing well. The area on the left lateral calf and left dorsal foot are smaller. The area on the right foot in between the first and second toe webspace is barely visible although he still says there is some drainage here is the only reason I did not heal this out. Unfortunately the area on the left buttock almost looks like he has [name] skin tear from tape. He has open wound and then [name] large flap of skin that we are trying to get adherence over an area just next to the remaining wound 6/21; 2 week follow-up. I believe is been here for nurse visits. Miraculously the area between his first and second toes on the left dorsal foot is closed over. Still open on the right first second web space. The left lateral calf has 2 open areas. Distally this is more superficial. The proximal area had [name] little more depth and required debridement of adherent necrotic material. His buttock wound is actually larger we have been using silver alginate here 6/28; the patient's area on the left foot remains closed. Still open wet area between the first and second toes on the right and also extending into the plantar aspect. We have been using silver alginate in this location. He has 2 areas on the left lower leg part of his original long wounds which I think are better. We have been using Hydrofera Blue here. Hydrofera Blue to the left buttock which is stable 7/12; left foot remains closed. Left ankle is closed. May be [name] small area between his right first and second toes the only truly open area is on the left buttock. We have been using Hydrofera Blue here 7/19; patient arrives with marked deterioration especially in  the left foot and ankle. We did not put him in [name] compression wrap on the left last week in fact he wore his juxta lite stockings on either side although he does not have an underlying stocking. He has [name] reopening on the left dorsal foot, left lateral ankle and [name] new area on the right dorsal ankle. More worrisome is the degree of erythema on the left foot extending on the lateral foot into the lateral lower leg on the left 7/26; the patient had erythema and drainage from the lateral left ankle last week. Culture of this grew MRSA resistant to doxycycline and clindamycin which  are the 2 antibiotics we usually use with this patient who has multiple antibiotic allergies including linezolid, trimethoprim sulfamethoxazole. I had give him an empiric doxycycline and he comes in the area certainly looks somewhat better although it is blotchy in his lower leg. He has not been systemically unwell. He has had areas on the left dorsal foot which is [name] reopening, chronic wounds on the left lateral ankle. Both of these I think are secondary to chronic venous insufficiency. The area between his first and second toes is closed as far as I can tell. He had [name] new wrap injury on the right dorsal ankle last week. Finally he has an area on the left buttock. We have been using silver alginate to everything except the left buttock we are using Hydrofera Blue 06/30/20-Patient returns at 1 week, has been given [name] sample dose pack of NUZYRA which is [name] tetracycline derivative [omadacycline], patient has completed those, we have been using silver alginate to almost all the wounds except the left ischium where we are using Hydrofera Blue all of them look better 8/16; since I last saw the patient he has been doing well. The area on the left buttock, left lateral ankle and left foot are all closed today. He has completed the [place] I gave him last time and tolerated this well. He still has open areas on the right dorsal ankle and in the  right first second toe area which we are using silver alginate. 8/23; we put him in his bilateral external compression stockings last week as he did not have anything open on either leg except for concerning area between the right first and second toe. He comes in today with an area on the left dorsal foot slightly more proximal than the original wound, the left lateral foot but this is actually [name] continuation of the area he had on the left lateral ankle from last time. As well he is opened up on the left buttock again. 8/30; comes in today with things looking [name] lot better. The area on the left lower ankle has closed down as has the left foot but with eschar in both areas. The area on the dorsal right ankle is also epithelialized. Very little remaining of the left buttock wound. We have been using silver alginate on all wound areas 9/13; the area in the first second toe webspace on the right has fully epithelialized. He still has some vulnerable epithelium on the right and the ankle and the dorsal foot. He notes weeping. He is using his juxta lite stocking. On the left again the left dorsal foot is closed left lateral ankle is closed. We went to the juxta lite stocking here as well. Still vulnerable in the left buttock although only 2 small open areas remain here 9/27; 2-week follow-up. We did not look at his left leg but the patient says everything is closed. He is [name] bit disturbed by the amount of edema in his left foot he is using juxta lite stockings but asking about over the toes stockings which would be 30/40, will talk to him next time. According to him there is no open wound on either the left foot or the left ankle/calf He has an open area on the dorsal right calf which I initially point [name] wrap injury. He has superficial remaining wound on the left ischial tuberosity been using silver alginate although he says this sticks to the wound 10/5; we gave him 2-week follow-up but he called yesterday  expressing some concerns about his right foot right ankle and the left buttock. He came in early. There is still no open areas on the left leg and that still in his juxta lite stocking 10/11; he only has 1 small area on the left buttock that remains measuring millimeters 1 mm. Still has the same irritated skin in this area. We recommended zinc oxide when this eventually closes and pressure relief is meticulously is he can do this. He still has an area on the dorsal part of his right first through third toes which is [name] bit irritated and still open and on the dorsal ankle near the crease of the ankle. We have been using silver alginate and using his own stocking. He has nothing open on the left leg or foot 10/25; 2-week follow-up. Not nearly as good on the left buttock as I was hoping. For open areas with 5 looking threatened small. He has the erythematous irritated chronic skin in this area. 1 area on the right dorsal ankle. He reports this area bleeds easily Right dorsal foot just proximal to the base of his toes We have been using silver alginate. 11/8; 2-week follow-up. Left buttock is about the same although I do not think the wounds are in the same location we have been using silver alginate. I have asked him to use zinc oxide on the skin around the wounds. He still has [name] small area on the right dorsal ankle he reports this bleeds easily Right dorsal foot just proximal to the base of the toes does not have anything open although the skin is very dry and scaly He has [name] new opening on the nailbed of the left great toe. Nothing on the left ankle 11/29; 3-week follow-up. Left buttock has 2 open areas. And washing of these wounds today started bleeding easily. Suggesting very friable tissue. We have been using silver alginate. Right dorsal ankle which I thought was initially [name] wrap injury we have been using silver alginate. Nothing open between the toes that I can see. He states the area on the left  dorsal toe nailbed healed after the last visit in 2 or 3 days 12/13; 3-week follow-up. His left buttock now has 3 open areas but the original 2 areas are smaller using polymen here. Surrounding skin looks better. The right dorsal ankle is closed. He has [name] small opening on the right dorsal foot at the level of the third toe. In general the skin looks better here. He is wearing his juxta lite stocking on the left leg says there is nothing open 11/24/2020; 3 weeks follow-up. His left buttock still has the 3 open areas. We have been using polymen but due to lack of response he changed to [facility] area. Surrounding skin is dry erythematous and irritated looking. There is no evidence of infection either bacterial or fungal however there is loss of surface epithelium He still has very dry skin in his foot causing irritation and erythema on the dorsal part of his toes. This is not responded to prolonged courses of antifungal simply looks dry and irritated 1/24; left buttock area still looks about the same he was unable to find the triad ointment that we had suggested. The area on the right lower leg just above the dorsal ankle has reopened and the areas on the right foot between the first second and second third toes and scaling on the bottom of the foot has been about the same for quite some time now.  been using silver alginate to all wound areas 2/7; left buttock wound looked quite good although not much smaller in terms of surface area surrounding skin looks better. Only [name] few dry flaking areas on the right foot in between the first and second toes the skin generally looks better here [ammonium lactate]. Finally the area on the right dorsal ankle is closed 2/21; There is no open area on the right foot even between the right first and second toe. Skin around this area dorsally and plantar aspects look better. He has [name] reopening of the area on the right ankle just above the crease of the ankle dorsally.  I continue to think that this is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 his left lateral leg ulcer and bilateral feet ulcers. He is on oral antibiotic therapy, tolerating that. Nursing staff and the patient states that the erythema is improved from Monday. 01/13/17; the predominant left lateral leg wound continues to be problematic. I had put Apligraf on him earlier this month once. However he subsequently developed what appeared to be an intense cellulitis around the left lateral leg wound. I gave him Dalvance I think on 2/12 perhaps 2/13 he continues on cefdinir. The erythema is still present but the warmth and swelling is improved. I am hopeful that the cellulitis part of this control. I wouldn't be surprised if there is an element of venous inflammation as well. 01/17/17. The erythema is present but better in the left leg. His left lateral leg wound still does not have [name] viable surface buttons certain parts of this long thin wound it appears like there has been improvement in dimensions. 01/20/17; the erythema still present but much better in the left leg. I'[name] thinking this is his usual degree of chronic venous inflammation. The wound on the left leg looks somewhat better. Is less surface slough 01/27/17; erythema is back to the chronic venous inflammation. The wound  on the left leg is somewhat better. I am back to the point where I like to try an Apligraf once again 02/10/17; slight improvement in wound  dimensions. Apligraf #2. He is completing his doxycycline 02/14/17; patient arrives today having completed doxycycline last Thursday. This was supposed to be [name] nurse visit however once again he hasn't tense erythema from the medial part of his wound extending over the lower leg. Also erythema in his foot this is roughly in the same distribution as last time. He has baseline chronic venous inflammation however this is [name] lot worse than the baseline I have learned to accept the on him is baseline inflammation 02/24/17- patient is here for follow-up evaluation. He is tolerating compression therapy. His voicing no complaints or concerns he is here anticipating an Apligraf 03/03/17; he arrives today with an adherent necrotic surface. I don't think this is surface is going to be amenable for Apligraf's. The erythema around his wound and on the left dorsal foot has resolved he is off antibiotics 03/10/17; better-looking surface today. I don't think he can tolerate Apligraf's. He tells me he had [name] wound VAC after [name] skin graft years ago to this area and they had difficulty with [name] seal. The erythema continues to be stable around this some degree of chronic venous inflammation but he also has recurrent cellulitis. We have been using Iodoflex 03/17/17; continued improvement in the surface and may be small changes in dimensions. Using Iodoflex which seems the only thing that will control his surface 03/24/17- He is here for follow up evaluation of his LLE lateral ulceration and ulcer to right dorsal foot/toe space. He is voicing no complaints or concerns, He is tolerating compression wrap. 03/31/17 arrives today with [name] much healthier looking wound on the left lower extremity. We have been using Iodoflex for [name] prolonged period of time which has for the first time prepared and adequate looking wound bed although we have not had much in the way of wound dimension improvement. He also has [name] small wound between the first and  second toe on the right 04/07/17; arrives today with [name] healthy-looking wound bed and at least the top 50% of this wound appears to be now her. No debridement was required I have changed him to [facility] last week after prolonged Iodoflex. He did not do well with Apligraf's. We've had [name] re-opening between the first and second toe on the right 04/14/17; arrives today with [name] healthier looking wound bed contractions and the top 50% of this wound and some on the lesser 50%. Wound bed appears healthy. The area between the first and second toe on the right still remains problematic 04/21/17; continued very gradual improvement. Using [facility] 04/28/17; continued very gradual improvement in the left lateral leg venous insufficiency wound. His periwound erythema is very mild. We have been using Hydrofera Blue. Wound is making progress especially in the superior 50% 05/05/17; he continues to have very gradual improvement in the left lateral venous insufficiency wound. Both in terms with an length rings are improving. I debrided this every 2 weeks with #5 curet and we have been using Hydrofera Blue and again making good progress With regards to the wounds between his right first and second toe which I thought might of been tinea pedis he is not making as much progress very dry scaly skin over the area. Also the area at the base of the left first and second toe in [name] similar condition 05/12/17; continued gradual improvement  in the refractory left lateral venous insufficiency wound on the left. Dimension smaller. Surface still requiring debridement using Hydrofera Blue 05/19/17; continued gradual improvement in the refractory left lateral venous ulceration. Careful inspection of the wound bed underlying rumination suggested some degree of epithelialization over the surface no debridement indicated. Continue Hydrofera Blue difficult areas between his toes first and third on the left than first and second on the  right. I'[name] going to change to silver alginate from silver collagen. Continue ketoconazole as I suspect underlying tinea pedis 05/26/17; left lateral leg venous insufficiency wound. We've been using Hydrofera Blue. I believe that there is expanding epithelialization over the surface of the wound albeit not coming from the wound circumference. This is [name] bit of an odd situation in which the epithelialization seems to be coming from the surface of the wound rather than in the exact circumference. There is still small open areas mostly along the lateral margin of the wound. ooHe has unchanged areas between the left first and second and the right first second toes which I been treating for tenia pedis 06/02/17; left lateral leg venous insufficiency wound. We have been using Hydrofera Blue. Somewhat smaller from the wound circumference. The surface of the wound remains [name] bit on it almost epithelialized sedation in appearance. I use an open curette today debridement in the surface of all of this especially the edges ooSmall open wounds remaining on the dorsal right first and second toe interspace and the plantar left first second toe and her face on the left 06/09/17; wound on the left lateral leg continues to be smaller but very gradual and very dry surface using Hydrofera Blue 06/16/17 requires weekly debridements now on the left lateral leg although this continues to contract. I changed to silver collagen last week because of dryness of the wound bed. Using Iodoflex to the areas on his first and second toes/web space bilaterally 06/24/17; patient with history of paraplegia also chronic venous insufficiency with lymphedema. Has [name] very difficult wound on the left lateral leg. This has been gradually reducing in terms of with but comes in with [name] very dry adherent surface. High switch to silver collagen [name] week or so ago with hydrogel to keep the area moist. This is been refractory to multiple dressing attempts. He  also has areas in his first and second toes bilaterally in the anterior and posterior web space. I had been using Iodoflex here after [name] prolonged course of silver alginate with ketoconazole was ineffective [question tinea pedis] 07/14/17; patient arrives today with [name] very difficult adherent material over his left lateral lower leg wound. He also has surrounding erythema and poorly controlled edema. He was switched his Santyl last visit which the nurses are applying once during his doctor visit and once on [name] nurse visit. He was also reduced to 2 layer compression I'[name] not exactly sure of the issue here. 07/21/17; better surface today after 1 week of Iodoflex. Significant cellulitis that we treated last week also better. [Doxycycline] 07/28/17 better surface today with now 2 weeks of Iodoflex. Significant cellulitis treated with doxycycline. He has now completed the doxycycline and he is back to his usual degree of chronic venous inflammation/stasis dermatitis. He reminds me he has had ablations surgery here 08/04/17; continued improvement with Iodoflex to the left lateral leg wound in terms of the surface of the wound although the dimensions are better. 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 Problematic  areas on the plantar aspect of the first second toe web space on the left and the dorsal aspect of the first second toe web space on the right. At one point I felt these were probably related to chronic fungal infections in treated him aggressively for this although we have not made any improvement here. 08/11/17; left lateral leg. Surface continues to improve with the Iodoflex although we are not seeing much improvement in overall wound dimensions. Areas on his plantar left foot and right foot show no improvement. In fact the right foot looks somewhat worse 08/18/17; left lateral leg. We changed to [facility] last week after [name]  prolonged course of Iodoflex which helps get the surface better. It appears that the wound with is improved. Continue with difficult areas on the left dorsal first second and plantar first second on the right 09/01/17; patient arrives in clinic today having had [name] temperature of 103 yesterday. He was seen in the ER and [facility] The patient was concerned he could have cellulitis again in the right leg however they diagnosed him with [name] UTI and he is now on Keflex. He has [name] history of cellulitis which is been recurrent and difficult but this is been in the left leg, in the past 5 use doxycycline. He does in and out catheterizations at home which are risk factors for UTI 09/08/17; patient will be completing his Keflex this weekend. The erythema on the left leg is considerably better. He has [name] new wound today on the medial part of the right leg small superficial almost looks like [name] skin tear. He has worsening of the area on the right dorsal first and second toe. His major area on the left lateral leg is better. Using Hydrofera Blue on all areas 09/15/17; gradual reduction in width on the long wound in the left lateral leg. No debridement required. He also has wounds on the plantar aspect of his left first second toe web space and on the dorsal aspect of the right first second toe web space. 09/22/17; there continues to be very gradual improvements in the dimensions of the left lateral leg wound. He hasn't round erythematous spot with might be pressure on his wheelchair. There is no evidence obviously of infection no purulence no warmth ooHe has [name] dry scaled area on the plantar aspect of the left first second toe ooImproved area on the dorsal right first second toe. 09/29/17; left lateral leg wound continues to improve in dimensions mostly with an is still [name] fairly long but increasingly narrow wound. ooHe has [name] dry scaled area on the plantar aspect of his left first second toe web space ooIncreasingly  concerning area on the dorsal right first second toe. In fact I am concerned today about possible cellulitis around this wound. The areas extending up his second toe and although there is deformities here almost appears to abut on the nailbed. 10/06/17; left lateral leg wound continues to make very gradual progress. Tissue culture I did from the right first second toe dorsal foot last time grew MRSA and enterococcus which was vancomycin sensitive. This was not sensitive to clindamycin or doxycycline. He is allergic to Zyvox and sulfa we have therefore arrange for him to have dalvance infusion tomorrow. He is had this in the past and tolerated it well 10/20/17; left lateral leg wound continues to make decent progress. This is certainly reduced in terms of with there is advancing epithelialization.ooThe cellulitis in the right foot looks better although he still  has [name] deep wound in the dorsal aspect of the first second toe web space. Plantar left first toe web space on the left I think is making some progress 10/27/17; left lateral leg wound continues to make decent progress. Advancing epithelialization.using Hydrofera Blue ooThe right first second toe web space wound is better-looking using silver alginate ooImprovement in the left plantar first second toe web space. Again using silver alginate 11/03/17 left lateral leg wound continues to make decent progress albeit slowly. Using Hydrofera Blue ooThe right per second toe web space continues to be [name] very problematic looking punched out wound. I obtained [name] piece of tissue for deep culture I did extensively treated this for fungus. It is difficult to imagine that this is [name] pressure area as the patient states other than going outside he doesn't really wear shoes at home ooThe left plantar first second toe web space looked fairly senescent. Necrotic edges. This required debridement oochange to Hydrofera Blue to all wound areas 11/10/17; left lateral  leg wound continues to contract. Using Hydrofera Blue ooOn the right dorsal first second toe web space dorsally. Culture I did of this area last week grew MRSA there is not an easy oral option in this patient was multiple antibiotic allergies or intolerances. This was only [name] rare culture isolate I'[name] therefore going to use Bactroban under silver alginate ooOn the left plantar first second toe web space. Debridement is required here. This is also unchanged 11/17/17; left lateral leg wound continues to contract using Hydrofera Blue this is no longer the major issue. ooThe major concern here is the right first second toe web space. He now has an open area going from dorsally to the plantar aspect. There is now wound on the inner lateral part of the first toe. Not [name] very viable surface on this. There is erythema spreading medially into the forefoot. ooNo major change in the left first second toe plantar wound 11/24/17; left lateral leg wound continues to contract using Hydrofera Blue. Nice improvement today ooThe right first second toe web space all of this looks [name] lot less angry than last week. I have given him clindamycin and topical Bactroban for MRSA and terbinafine for the possibility of underlining tinea pedis that I could not control with ketoconazole. Looks somewhat better ooThe area on the plantar left first second toe web space is weeping with dried debris around the wound 12/01/17; left lateral leg wound continues to contract he Hydrofera Blue. It is becoming thinner in terms of with nevertheless it is making good improvement. ooThe right first second toe web space looks less angry but still [name] large necrotic-looking wounds starting on the plantar aspect of the right foot extending between the toes and now extensively on the base of the right second toe. I gave him clindamycin and topical Bactroban for MRSA anterior benefiting for the possibility of underlying tinea pedis. Not looking better  today ooThe area on the left first/second toe looks better. Debrided of necrotic debris 12/05/17* the patient was worked in urgently today because over the weekend he found blood on his incontinence bad when he woke up. He was found to have an ulcer by his wife who does most of his wound care. He came in today for [place] to look at this. He has not had [name] history of wounds in his buttocks in spite of his paraplegia. 12/08/17; seen in follow-up today at his usual appointment. He was seen earlier this week and found to have [name] new  wound on his buttock. We also follow him for wounds on the left lateral leg, left first second toe web space and right first second toe web space 12/15/17; we have been using Hydrofera Blue to the left lateral leg which has improved. The right first second toe web space has also improved. Left first second toe web space plantar aspect looks stable. The left buttock has worsened using Santyl. Apparently the buttock has drainage 12/22/17; we have been using Hydrofera Blue to the left lateral leg which continues to improve now 2 small wounds separated by normal skin. He tells [place] he had [name] fever up to 100 yesterday he is prone to UTIs but has not noted anything different. He does in and out catheterizations. The area between the first and second toes today does not look good necrotic surface covered with what looks to be purulent drainage and erythema extending into the third toe. I had gotten this to something that I thought look better last time however it is not look good today. He also has [name] necrotic surface over the buttock wound which is expanded. I thought there might be infection under here so I removed [name] lot of the surface with [name] #5 curet though nothing look like it really needed culturing. He is been using Santyl to this area 12/27/17; his original wound on the left lateral leg continues to improve using Hydrofera Blue. I gave him samples of Baxdella although he was unable to  take them out of fear for an allergic reaction ["lump in his throat"].the culture I did of the purulent drainage from his second toe last week showed both enterococcus and [name] set Enterobacter I was also concerned about the erythema on the bottom of his foot although paradoxically although this looks somewhat better today. 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 We've been using silver alginate here. They came up today that he sleeps in [name] recliner, I'[name] not sure why but I asked him to stop this 01/03/18; his original wound we've been using Hydrofera Blue is now separated into 2 areas. ooUlcer on his left buttock is better he is off the recliner and sleeping in bed ooFinally both wound areas between his first and second toes also looks some better 01/10/18; his original wound on the left lateral leg is now separated into 2 wounds we've been using Hydrofera Blue ooUlcer on his left buttock has some drainage. There is [name] small probing site going into muscle layer superiorly.using silver alginate -He arrives today with [name] deep tissue injury on the left heel ooThe wound on the dorsal aspect of his first second toe on the left looks [name] lot betterusing silver alginate ketoconazole ooThe area on the first second toe web space on the right also looks [name] lot bette 01/17/18; his original wound on the left lateral leg continues to progress using Hydrofera Blue ooUlcer on his left buttock also is smaller surface healthier except for [name] small probing site going into the muscle layer superiorly. 2.4 cm of tunneling in this area ooDTI on his left heel we have only been offloading. Looks better than last week no threatened open no evidence of infection oothe wound on the dorsal aspect of the first second toe on the left continues to look like it's regressing we have only been using silver alginate and  terbinafine orally ooThe area in the first second toe web space on the  right also looks to be [name] lot better using silver alginate and terbinafine I think this was prompted by tinea pedis 01/31/18; the patient was hospitalized in [place] last week apparently for [name] complicated UTI. He was discharged on cefepime he does in and out catheterizations. In the hospital he was discovered [name] I don't mild elevation of AST and ALT and the terbinafine was stopped.predictably the pressure ulcer on s his buttock looks betterusing silver alginate. The area on the left lateral leg also is better using Hydrofera Blue. The area between the first and second toes on the left better. First and second toes on the right still substantial but better. Finally the DTI on the left heel has held together and looks like it's resolving 02/07/18-he is here in follow-up evaluation for multiple ulcerations. He has new injury to the lateral aspect of the last issue [name] pressure ulcer, he states this is from adhesive removal trauma. He states he has tried multiple adhesive products with no success. All other ulcers appear stable. The left heel DTI is resolving. We will continue with same treatment plan and follow-up next week. 02/14/18; follow-up for multiple areas. ooHe has [name] new area last week on the lateral aspect of his pressure ulcer more over the posterior trochanter. The original pressure ulcer looks quite stable has healthy granulation. 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 Almost fully epithelialized on the original superior area using Hydrofera Blue ooDTI on the left heel has peeled off this week to reveal [name] small superficial wound under denuded skin and subcutaneous tissue ooBoth areas between the first and second toes look better including nothing open on the left 02/21/18; ooThe patient's wounds on his left  ischial tuberosity and posterior left greater trochanter actually looked better. He has [name] large area of irritation around the area which I think is contact dermatitis. I am doubtful that this is fungal ooHis original wound on the left lateral calf continues to improve we have been using Hydrofera Blue ooThere is no open area in the left first second toe web space although there is [name] lot of thick callus ooThe DTI on the left heel required debridement today of necrotic surface eschar and subcutaneous tissue using silver alginate ooFinally the area on the right first second toe webspace continues to contract using silver alginate and ketoconazole 02/28/18 ooLeft ischial tuberosity wounds look better using silver alginate. ooOriginal wound on the left calf only has one small open area left using Hydrofera Blue ooDTI on the left heel required debridement mostly removing skin from around this wound surface. Using silver alginate ooThe areas on the right first/second toe web space using silver alginate and ketoconazole 03/08/18 on evaluation today patient appears to be doing decently well as best I can tell in regard to his wounds. This is the first time that I have seen him as he generally is followed by Dr. Dellia Nims. With that being said none of his wounds appear to be infected he does have an area where there is some skin covering what appears to be [name] new wound on the left dorsal surface of his great toe. This is right at the nail bed. With that being said I do believe that debrided away some of the excess skin can be of benefit in this regard. Otherwise he has been tolerating the dressing changes without complication. 03/14/18; patient arrives today with the multiplicity of wounds  that we are following. He has not been systemically unwell ooOriginal wound on the left lateral calf now only has 2 small open areas we've been using Hydrofera Blue which should continue ooThe deep tissue injury on the  left heel requires debridement today. We've been using silver alginate ooThe left first second toe and the right first second toe are both are reminiscence what I think was tinea pedis. Apparently some of the callus Surface between the toes was removed last week when it started draining. ooPurulent drainage coming from the wound on the ischial tuberosity on the left. 03/21/18-He is here in follow-up evaluation for multiple wounds. 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 He tolerated debridement. The only change to last week's recommendations is to discontinue antifungal cream between toes. He will follow-up next week 03/28/18; following up for multiple wounds;Concern this week is streaking redness and swelling in the right foot. He is going to need antibiotics for this. 03/31/18; follow-up for right foot cellulitis. Streaking redness and swelling in the right foot on 03/28/18. 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 I put him on clindamycin 300 mg every 6 and brought him in for [name] quick check. He has an open wound between his first and second toes on the right foot as [name] potential source. 04/04/18; ooRight foot cellulitis is resolving he is completing clindamycin. This is truly good news ooLeft lateral calf wound which is initial wound only has one small open area inferiorly this is close to healing out. He has compression stockings. We will use Hydrofera Blue right down to the epithelialization of this ooNonviable surface on the left heel which was initially pressure with [name] DTI. We've been using Hydrofera Blue. I'[name] going to switch this back to silver alginate ooLeft first second toe/tinea pedis this looks better using silver alginate ooRight first second toe tinea pedis using silver alginate ooLarge pressure ulcers on theLeft ischial tuberosity. Small wound here Looks better. I am uncertain about  the surface over the large wound. Using silver alginate 04/11/18; ooCellulitis in the right foot is resolved ooLeft lateral calf wound which was his original wounds still has 2 tiny open areas remaining this is just about closed ooNonviable surface on the left heel is better but still requires debridement ooLeft first second toe/tinea pedis still open using silver alginate ooRight first second toe wound tinea pedis I asked him to go back to using ketoconazole and silver alginate ooLarge pressure ulcers on the left ischial tuberosity this shear injury here is resolved. Wound is smaller. No evidence of infection using silver alginate 04/18/18; ooPatient arrives with an intense area of cellulitis in the right mid lower calf extending into the right heel area. Bright red and warm. Smaller area on the left anterior leg. He has [name] significant history of MRSA. He will definitely need antibioticsoodoxycycline ooHe now has 2 open areas on the left ischial tuberosity the original large wound and now [name] satellite area which I think was above his initial satellite areas. Not [name] wonderful surface on this satellite area surrounding erythema which looks like pressure related. ooHis left lateral calf wound again his original wound is just about closed ooLeft heel pressure injury still requiring debridement ooLeft first second toe looks [name] lot better using silver alginate ooRight first second toe also using silver alginate and ketoconazole cream also looks better 04/20/18; the patient was worked in early today out of concerns with his cellulitis  on the right leg. I had started him on doxycycline. This was 2 days ago. His wife was concerned about the swelling in the area. Also concerned about the left buttock. He has not been systemically unwell no fever chills. No nausea vomiting or diarrhea 04/25/18; the patient's left buttock wound is continued to deteriorate he is using Hydrofera Blue. He is still  completing clindamycin for the cellulitis on the right leg although all of this looks better. 05/02/18 ooLeft buttock wound still with [name] lot of drainage and [name] very tightly adherent fibrinous necrotic surface. He has [name] deeper area superiorly ooThe left lateral calf wound is still closed ooDTI wound on the left heel necrotic surface especially the circumference using Iodoflex ooAreas between his left first second toe and right first second toe both look better. Dorsally and the right first second toe he had [name] necrotic surface although at smaller. In using silver alginate and ketoconazole. I did [name] culture last week which was [name] deep tissue culture of the reminiscence of the open wound on the right first second toe dorsally. This grew [name] few Acinetobacter and [name] few methicillin-resistant staph aureus. Nevertheless the area actually this week looked better. I didn't feel the need to specifically address this at least in terms of systemic antibiotics. 05/09/18; wounds are measuring larger more drainage per our intake. We are using Santyl covered with alginate on the large superficial buttock wounds, Iodosorb on the left heel, ketoconazole and silver alginate to the dorsal first and second toes bilaterally. 05/16/18; ooThe area on his left buttock better in some aspects although the area superiorly over the ischial tuberosity required an extensive debridement.using Santyl ooLeft heel appears stable. Using Iodoflex ooThe areas between his first and second toes are not bad however there is spreading erythema up the dorsal aspect of his left foot this looks like cellulitis again. He is insensate the erythema is really very brilliant.o Erysipelas He went to see an allergist days ago because he was itching part of this he had lab work done. This showed [name] white count of 15.1 with 70% neutrophils. Hemoglobin of 11.4 and [name] platelet count of 659,000. Last white count we had in Epic was [name] 2-1/2 years ago which  was 25.9 but he was ill at the time. He was able to show me some lab work that was done by his primary physician the pattern is about the same. 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 But prompted me to go ahead and do x-rays of both feet and the pelvis rule out osteomyelitis. He also had [name] comprehensive metabolic panel this was reasonably normal his albumin was 3.7 liver function tests BUN/creatinine all normal 05/23/18; x-rays of both his feet from last week were negative for underlying pulmonary abnormality. The x-ray of his pelvis however showed mild irregularity in the left ischial which may represent some early osteomyelitis. The wound in the left ischial continues to get deeper clearly now exposed muscle. Each week necrotic surface material over this area. Whereas the rest of the wounds do not look so bad. ooThe left ischial wound we have been using Santyl and calcium alginate ooT the left heel surface necrotic debris using Iodoflex o ooThe left lateral leg is still healed ooAreas on the left dorsal foot and the right dorsal foot are about the same. There is some inflammation on the left which might represent contact dermatitis, fungal dermatitis I am doubtful cellulitis although this  looks better than last week 05/30/18; CT scan done at Hospital did not show any osteomyelitis or abscess. Suggested the possibility of underlying cellulitis although I don't see [name] lot of evidence of this at the bedside ooThe wound itself on the left buttock/upper thigh actually looks somewhat better. No debridement ooLeft heel also looks better no debridement continue Iodoflex ooBoth dorsal first second toe spaces appear better using Lotrisone. Left still required debridement 06/06/18; ooIntake reported some purulent looking drainage from the left gluteal wound. Using Santyl and calcium alginate ooLeft heel looks better although still [name] nonviable surface  requiring debridement ooThe left dorsal foot first/second webspace actually expanding and somewhat deeper. I may consider doing [name] shave biopsy of this area ooRight dorsal foot first/second webspace appears stable to improved. Using Lotrisone and silver alginate to both these areas 06/13/18 ooLeft gluteal surface looks better. Now separated in the 2 wounds. No debridement required. Still drainage. We'll continue silver alginate ooLeft heel continues to look better with Iodoflex continue this for at least another week ooOf his dorsal foot wounds the area on the left still has some depth although it looks better than last week. We've been using Lotrisone and silver alginate 06/20/18 ooLeft gluteal continues to look better healthy tissue ooLeft heel continues to look better healthy granulation wound is smaller. He is using Iodoflex and his long as this continues continue the Iodoflex ooDorsal right foot looks better unfortunately dorsal left foot does not. There is swelling and erythema of his forefoot. He had minor trauma to this several days ago but doesn't think this was enough to have caused any tissue injury. Foot looks like cellulitis, we have had this problem before 06/27/18 on evaluation today patient appears to be doing [name] little worse in regard to his foot ulcer. 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 Both of which would really be his only options for treating this infection. In the past he has been given and effusion of Orbactiv. This is done very well for him in the past again it's one time dosing IV antibiotic therapy. Subsequently I do believe this is something we're gonna need to see about doing at this point in time. Currently his other wounds seem to be doing somewhat better in my pinion I'[name] pretty happy in that regard. 07/03/18 on  evaluation today patient's wounds actually appear to be doing fairly well. He has been tolerating the dressing changes without complication. All in all he seems to be showing signs of improvement. In regard to the antibiotics he has been dealing with infectious disease since I saw him last week as far as getting this scheduled. In the end [facility]'s [facility] this coming Friday. In the meantime he has been continuing to perform the dressing changes in such as previous. There does not appear to be any evidence of infection worsengin at this time. 07/10/18; ooSince I last saw this man 2 weeks ago things have actually improved. IV antibiotics of resulted in less forefoot erythema although there is still some present. He is not systemically unwell ooLeft buttock wounds o2 now have no depth there is increased epithelialization Using silver alginate ooLeft heel still requires debridement using Iodoflex ooLeft dorsal foot still with [name] sizable wound about the size of [name] border but healthy granulation ooRight  dorsal foot still with [name] slitlike area using silver alginate 07/18/18; the patient's cellulitis in the left foot is improved in fact I think it is on its way to resolving. ooLeft buttock wounds o2 both look better although the larger one has hypertension granulation we've been using silver alginate ooLeft heel has some thick circumferential redundant skin over the wound edge which will need to be removed today we've been using Iodoflex ooLeft dorsal foot is still [name] sizable wound required debridement using silver alginate ooThe right dorsal foot is just about closed only [name] small open area remains here 07/25/18; left foot cellulitis is resolved ooLeft buttock wounds o2 both look better. Hyper-granulation on the major area ooLeft heel as some debris over the surface but otherwise looks [name] healthier wound. Using silver collagen ooRight dorsal  foot is just about closed 07/31/18; arrives with our intake nurse worried about purulent drainage from the buttock. We had hyper-granulation here last week ooHis buttock wounds o2 continue to look better ooLeft heel some debris over the surface but measuring smaller. ooRight dorsal foot unfortunately has openings between the toes ooLeft foot superficial wound looks less aggravated. 08/07/18 ooButtock wounds continue to look better although some of her granulation and the larger medial wound. silver alginate ooLeft heel continues to look [name] lot better.silver collagen ooLeft foot superficial wound looks less stable. Requires debridement. He has [name] new wound superficial area on the foot on the lateral dorsal foot. ooRight foot looks better using silver alginate without Lotrisone 08/14/2018; patient was in the ER last week diagnosed with [name] UTI. He is now on Cefpodoxime and Macrodantin. ooButtock wounds continued to be smaller. Using silver alginate ooLeft heel continues to look better using silver collagen ooLeft foot superficial wound looks as though it is improving ooRight dorsal foot area is just about healed. 08/21/2018; patient is completed his antibiotics for his UTI. ooHe has 2 open areas on the buttocks. There is still not closed although the surface looks satisfactory. Using silver alginate ooLeft heel continues to improve using silver collagen ooThe bilateral dorsal foot areas which are at the base of his first and second toes/possible tinea pedis are actually stable on the left but worse on the right. The area on the left required debridement of necrotic surface. After debridement I obtained [name] specimen for PCR culture. ooThe right dorsal foot which is been just about healed last week is now reopened 08/28/2018; culture done on the left dorsal foot showed coag negative staph both staph epidermidis and Lugdunensis. I think this is worthwhile initiating systemic treatment. I will  use doxycycline given his long list of allergies. The area on the left heel slightly improved but still requiring debridement. ooThe large wound on the buttock is just about closed whereas the smaller one is larger. Using silver alginate in this area 09/04/2018; patient is completing his doxycycline for the left foot although this continues to be [name] very difficult wound area with very adherent necrotic debris. We are using silver alginate to all his wounds right foot left foot and the small wounds on his buttock, silver collagen on the left heel. 09/11/2018; once again this patient has intense erythema and swelling of the left forefoot. Lesser degrees of erythema in the right foot. He has [name] long list of allergies and intolerances. I will reinstitute doxycycline. 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 Using silver alginate ooLeft heel also looks better using silver collagen ooUnfortunately  both the areas on his feet look worse. The area on the left first second webspace is now gone through to the plantar part of his foot. The area on the left foot anteriorly is irritated with erythema and swelling in the forefoot. 09/25/2018 ooHis wound on the left plantar heel looks better. Using silver collagen ooThe area on the left buttock 2 small remnant areas. One is closed one is still open. Using silver alginate ooThe areas between both his first and second toes look worse. This in spite of long-standing antifungal therapy with ketoconazole and silver alginate which should have antifungal activity ooHe has small areas around his original wound on the left calf one is on the bottom of the original scar tissue and one superiorly both of these are small and superficial but again given wound history in this site this is worrisome 10/02/2018 ooLeft plantar heel continues to gradually contract using silver collagen ooLeft buttock wound is unchanged  using silver alginate ooThe areas on his dorsal feet between his first and second toes bilaterally look about the same. 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 Small open area here. We have been using silver alginate 10/09/2018; ooLeft plantar heel continues to look healthy and contract. No debridement is required ooLeft buttock slightly smaller with [name] tape injury wound just below which was new this week ooDorsal feet somewhat improved I have been using clindamycin ooLeft lateral looks lower extremity the actual open area looks worse although [name] lot of this is epithelialized. I am going to change to silver collagen today He has [name] lot more swelling in the right leg although this is not pitting not red and not particularly warm there is [name] lot of spasm in the right leg usually indicative of people with paralysis of some underlying discomfort. We have reviewed his vascular status from 2017 he had [name] left greater saphenous vein ablation. I wonder about referring him back to vascular surgery if the area on the left leg continues to deteriorate. 10/16/2018 in today for follow-up and management of multiple lower extremity ulcers. His left Buttock wound is much lower smaller and almost closed completely. The wound to the left ankle has began to reopen with Epithelialization and some adherent slough. He has multiple new areas to the left foot and leg. The left dorsal foot without much improvement. Wound present between left great webspace and 2nd toe. Erythema and edema present right leg. Right LE ultrasound obtained on 10/10/18 was negative for DVT. 10/23/2018; ooLeft buttock is closed over. Still dry macerated skin but there is no open wound. I suspect this is chronic pressure/moisture ooLeft lateral calf is quite [name] bit  worse than when I saw this last. There is clearly drainage here he has macerated skin into the left plantar heel. We will change the primary dressing to alginate ooLeft dorsal foot has some improvement in overall wound area. Still using clindamycin and silver alginate ooRight dorsal foot about the same as the left using clindamycin and silver alginate ooThe erythema in the right leg has resolved. He is DVT rule out was negative ooLeft heel pressure area required debridement although the wound is smaller and the surface is health 10/26/2018 ooThe patient came back in for his nurse check today predominantly because of the drainage coming out of the left lateral leg with [name] recent  reopening of his original wound on the left lateral calf. He comes in today with [name] large amount of surrounding erythema around the wound extending from the calf into the ankle and even in the area on the dorsal foot. He is not systemically unwell. He is not febrile. Nevertheless this looks like cellulitis. We have been using silver alginate to the area. I changed him to [name] regular visit and I am going to prescribe him doxycycline. The rationale here is [name] long list of medication intolerances and [name] history of MRSA. I did not see anything that I thought would provide [name] valuable culture 10/30/2018 ooFollow-up from his appointment 4 days ago with really an extensive area of cellulitis in the left calf left lateral ankle and left dorsal foot. I put him on doxycycline. He has [name] long list of medication allergies which are true allergy reactions. 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 In any case he is [name] lot better today. The erythema especially in the anterior and lateral left calf is better. He still has left ankle erythema. He also is complaining about increasing edema in the right leg we have only been using Kerlix Coban and he has been doing the wraps at  home. Finally he has [name] spotty rash on the medial part of his upper left calf which looks like folliculitis or perhaps wrap occlusion type injury. Small superficial macules not pustules 11/06/18 patient arrives today with again [name] considerable degree of erythema around the wound on the left lateral calf extending into the dorsal ankle and dorsal foot. This is [name] lot worse than when I saw this last week. He is on doxycycline really with not [name] lot of improvement. He has not been systemically unwell Wounds on the; left heel actually looks improved. Original area on the left foot and proximity to the first and second toes looks about the same. He has superficial areas on the dorsal foot, anterior calf and then the reopening of his original wound on the left lateral calf which looks about the same ooThe only area he has on the right is the dorsal webspace first and second which is smaller. ooHe has [name] large area of dry erythematous skin on the left buttock small open area here. 11/13/2018; the patient arrives in much better condition. The erythema around the wound on the left lateral calf is [name] lot better. Not sure whether this was the clindamycin or the TCA and ketoconazole or just in the improvement in edema control [stasis dermatitis]. In any case this is [name] lot better. The area on the left heel is very small and just about resolved using silver collagen we have been using silver alginate to the areas on his dorsal feet 11/20/2018; his wounds include the left lateral calf, left heel, dorsal aspects of both feet just proximal to the first second webspace. He is stable to slightly improved. I did not think any changes to his dressings were going to be necessary 11/27/2018 he has [name] reopening on the left buttock which is surrounded by what looks like tinea or perhaps some other form of dermatitis. The area on the left dorsal foot has some erythema around it I have marked this area but I am not sure whether this is  cellulitis or not. Left heel is not closed. Left calf the reopening is really slightly longer and probably worse 1/13; in general things look better and smaller except for  the left dorsal foot. Area on the left heel is just about closed, left buttock looks better only [name] small wound remains in the skin looks better [using Lotrisone] 1/20; the area on the left heel only has [name] few remaining open areas here. Left lateral calf about the same in terms of size, left dorsal foot slightly larger right lateral foot still not closed. The area on the left buttock has no open wound and the surrounding skin looks [name] lot better 1/27; the area on the left heel is closed. Left lateral calf better but still requiring extensive debridements. The area on his left buttock is closed. He still has the open areas on the left dorsal foot which is slightly smaller in the right foot which is slightly expanded. We have been using Iodoflex on these areas as well 2/3; left heel is closed. Left lateral calf still requiring debridement using Iodoflex there is no open area on his left buttock however he has dry scaly skin over [name] large area of this. Not really responding well to the Lotrisone. Finally the areas on his dorsal feet at the level of the first second webspace are slightly smaller on the right and about the same on the left. Both of these vigorously debrided with Anasept and gauze 2/10; left heel remains closed he has dry erythematous skin over the left buttock but there is no open wound here. Left lateral leg has come in and with. Still requiring debridement we have been using Iodoflex here. Finally the area on the left dorsal foot and right dorsal foot are really about the same extremely dry callused fissured areas. He does not yet have [name] dermatology appointment 2/17; left heel remains closed. He has [name] new open area on the left buttock. The area on the left lateral calf is bigger longer and still covered in necrotic  debris. No major change in his foot areas bilaterally. I am awaiting for [name] dermatologist to look on this. We have been using ketoconazole I do not know that this is been doing any good at all. 2/24; left heel remains closed. The left buttock wound that was new reopening last week looks better. The left lateral calf appears better also although still requires debridement. The major area on his foot is the left first second also requiring debridement. We have been putting Prisma on all wounds. I do not believe that the ketoconazole has done too much good for his feet. He will use Lotrisone I am going to give him [name] 2-week course of terbinafine. We still do not have [name] dermatology appointment 3/2 left heel remains closed however there is skin over bone in this area I pointed this out to him today. The left buttock wound is epithelialized but still does not look completely stable. The area on the left leg required debridement were using silver collagen here. With regards to his feet we changed to Lotrisone last week and silver alginate. 3/9; left heel remains closed. Left buttock remains closed. The area on the right foot is essentially closed. The left foot remains unchanged. Slightly smaller on the left lateral calf. Using silver collagen to both of these areas 3/16-Left heel remains closed. Area on right foot is closed. Left lateral calf above the lateral malleolus open wound requiring debridement with easy bleeding. Left dorsal wound proximal to first toe also debrided. Left ischial area open new. Patient has been using Prisma with wrapping every 3 days. Dermatology appointment is apparently tomorrow.Patient has completed his terbinafine  2-week course with some apparent improvement according to him, there is still flaking and dry skin in his foot on the left 3/23; area on the right foot is reopened. The area on the left anterior foot is about the same still [name] very necrotic adherent surface. He still  has the area on the left leg and reopening is on the left buttock. He apparently saw dermatology although I do not have [name] note. According to the patient who is usually fairly well informed they did not have any good ideas. Put him on oral terbinafine which she is been on before. 3/30; using silver collagen to all wounds. Apparently his dermatologist put him on doxycycline and rifampin presumably some culture grew staph. I do not have this result. He remains on terbinafine although I have used terbinafine on him before 4/6; patient has had [name] fairly substantial reopening on the right foot between the first and second toes. He is finished his terbinafine and I believe is on doxycycline and rifampin still as prescribed by dermatology. We have been using silver collagen to all his wounds although the patient reports that he thinks silver alginate does better on the wounds on his buttock. 4/13; the area on his left lateral calf about the same size but it did not require debridement. ooLeft dorsal foot just proximal to the webspace between the first and second toes is about the same. Still nonviable surface. I note some superficial bronze discoloration of the dorsal part of his foot ooRight dorsal foot just proximal to the first and second toes also looks about the same. I still think there may be the same discoloration I noted above on the left ooLeft buttock wound looks about the same 4/20; left lateral calf appears to be gradually contracting using silver collagen. 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 The left dorsal foot wound is debrided of tightly adherent necrotic debris and really cleans up quite nicely. The right area is worse with expansion. I did not debride this it is now over the base of the second toe ooThe area on his left buttock is smaller no debridement is required using silver collagen 5/4; left calf continues to make  good progress. ooHe arrives with erythema around the wounds on his dorsal foot which even extends to the plantar aspect. Very concerning for coexistent infection. He is finished the erythromycin I gave him for possible erythrasma this does not seem to have helped. ooThe area on the left foot is about the same base of the dorsal toes ooIs area on the buttock looks improved on the left 5/11; left calf and left buttock continued to make good progress. Left foot is about the same to slightly improved. ooMajor problem is on the right foot. He has not had an x-ray. Deep tissue culture I did last week showed both Enterobacter and E. coli. I did not change the doxycycline I put him on empirically although neither 1 of these were plated to doxycycline. He arrives today with the erythema looking worse on both the dorsal and plantar foot. Macerated skin on the bottom of the foot. he has not been systemically unwell 5/18-Patient returns at 1 week, left calf wound appears to be making some progress, left buttock wound appears slightly worse than last time, left foot wound looks slightly better, right foot redness is marginally better. X-ray of both feet show no air or evidence of osteomyelitis. Patient is finished his Omnicef and terbinafine. He continues  to have macerated skin on the bottom of the left foot as well as right 5/26; left calf wound is better, left buttock wound appears to have multiple small superficial open areas with surrounding macerated skin. X-rays that I did last time showed no evidence of osteomyelitis in either foot. He is finished cefdinir and doxycycline. I do not think that he was on terbinafine. He continues to have [name] large superficial open area on the right foot anterior dorsal and slightly between the first and second toes. I did send him to dermatology 2 months ago or so wondering about whether they would do [name] fungal scraping. I do not believe they did but did do [name] culture. We  have been using silver alginate to the toe areas, he has been using antifungals at home topically either ketoconazole or Lotrisone. We are using silver collagen on the left foot, silver alginate on the right, silver collagen on the left lateral leg and silver alginate on the left buttock 6/1; left buttock area is healed. We have the left dorsal foot, left lateral leg and right dorsal foot. We are using silver alginate to the areas on both feet and silver collagen to the area on his left lateral calf 6/8; the left buttock apparently reopened late last week. He is not really sure how this happened. He is tolerating the terbinafine. Using silver alginate to all wounds 6/15; left buttock wound is larger than last week but still superficial. ooCame in the clinic today with [name] report of purulence from the left lateral leg I did not identify any infection ooBoth areas on his dorsal feet appear to be better. He is tolerating the terbinafine. Using silver alginate to all wounds 6/22; left buttock is about the same this week, left calf quite [name] bit better. His left foot is about the same however he comes in with erythema and warmth in the right forefoot once again. Culture that I gave him in the beginning of May showed Enterobacter and E. coli. I gave him doxycycline and things seem to improve although neither 1 of these organisms was specifically plated. 6/29; left buttock is larger and dry this week. Left lateral calf looks to me to be improved. Left dorsal foot also somewhat improved right foot completely unchanged. The erythema on the right foot is still present. He is completing the Ceftin dinner that I gave him empirically [see discussion above.) 7/6 - All wounds look to be stable and perhaps improved, the left buttock wound is slightly smaller, per patient bleeds easily, completed ceftin, the right foot redness is less, he is on terbinafine 7/13; left buttock wound about the same perhaps slightly  narrower. Area on the left lateral leg continues to narrow. Left dorsal foot slightly smaller right foot about the same. We are using silver alginate on the right foot and Hydrofera Blue to the areas on the left. Unna boot on the left 2 layer compression on the right 7/20; left buttock wound absolutely the same. Area on lateral leg continues to get better. Left dorsal foot require debridement as did the right no major change in the 7/27; left buttock wound the same size necrotic debris over the surface. The area on the lateral leg is closed once again. His left foot looks better right foot about the same although there is some involvement now of the posterior first second toe area. He is still on terbinafine which I have given him for [name] month, not certain [name] centimeter major change 06/25/19-All  wounds appear to be slightly improved according to report, left buttock wound looks clean, both foot wounds have minimal to no debris the right dorsal foot has minimal slough. We are using Hydrofera Blue to the left and silver alginate to the right foot and ischial wound. 8/10-Wounds all appear to be around the same, the right forefoot distal part has some redness which was not there before, however the wound looks clean and small. Ischial wound looks about the same with no changes 8/17; his wound on the left lateral calf which was his original chronic venous insufficiency wound remains closed. Since I last saw him the areas on the left dorsal foot right dorsal foot generally appear better but require debridement. The area on his left initial tuberosity appears somewhat larger to me perhaps hyper granulated and bleeds very easily. We have been using Hydrofera Blue to the left dorsal foot and silver alginate to everything else 8/24; left lateral calf remains closed. The areas on his dorsal feet on the webspace of the first and second toes bilaterally both look better. The area on the left buttock which is the  pressure ulcer stage II slightly smaller. I change the dressing to Hydrofera Blue to all areas 8/31; left lateral calf remains closed. The area on his dorsal feet bilaterally look better. Using Hydrofera Blue. Still requiring debridement on the left foot. No change in the left buttock pressure ulcers however 9/14; left lateral calf remains closed. Dorsal feet look quite [name] bit better than 2 weeks ago. Flaking dry skin also [name] lot better with the ammonium lactate I gave him 2 weeks ago. The area on the left buttock is improved. He states that his Roho cushion developed [name] leak and he is getting [name] new one, in the interim he is offloading this vigorously 9/21; left calf remains closed. Left heel which was [name] possible DTI looks better this week. He had macerated tissue around the left dorsal foot right foot looks satisfactory and improved left buttock wound. I changed his dressings to his feet to silver alginate bilaterally. Continuing Hydrofera Blue on the left buttock. 9/28 left calf remains closed. Left heel did not develop anything [possible DTI] dry flaking skin on the left dorsal foot. Right foot looks satisfactory. Improved left buttock wound. We are using silver alginate on his feet Hydrofera Blue on the buttock. I have asked him to go back to the Lotrisone on his feet including the wounds and surrounding areas 10/5; left calf remains closed. The areas on the left and right feet about the same. [name] lot of this is epithelialized however debris over the remaining open areas. He is using Lotrisone and silver alginate. The area on the left buttock using Hydrofera Blue 10/26. Patient has been out for 3 weeks secondary to Covid concerns. He tested negative but I think his wife tested positive. He comes in today with the left foot substantially worse, right foot about the same. Even more concerning he states that the area on his left buttock closed over but then reopened and is considerably deeper in one  aspect than it was before [stage III wound] 11/2; left foot really about the same as last week. Quarter sized wound on the dorsal foot just proximal to the first second toes. Surrounding erythema with areas of denuded epithelium. This is not really much different looking. Did not look like cellulitis this time however. ooRight foot area about the same.. We have been using silver alginate alginate on his toes ooLeft  buttock still substantial irritated skin around the wound which I think looks somewhat better. We have been using Hydrofera Blue here. 11/9; left foot larger than last week and [name] very necrotic surface. Right foot I think is about the same perhaps slightly smaller. Debris around the circumference also addressed. Unfortunately on the left buttock there is been [name] decline. Satellite lesions below the major wound distally and now [name] an additional one posteriorly we have been using Hydrofera Blue but I think this is [name] pressure issue 11/16; left foot ulcer dorsally again [name] very adherent necrotic surface. Right foot is about the same. Not much change in the pressure ulcer on his left buttock. 11/30; left foot ulcer dorsally basically the same as when I saw him 2 weeks ago. Very adherent fibrinous debris on the wound surface. Patient reports [name] lot of drainage as well. 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 We have been using Iodoflex, patient changed back to alginate because of the drainage. Area on his right dorsal foot really looks benign with [name] healthier surface certainly [name] lot better than on the left. Left buttock wounds all improved using Hydrofera Blue 12/7; left dorsal foot again no improvement. Tightly adherent debris. PCR culture I did last week only showed likely skin contaminant. I have gone ahead and done [name] punch biopsy of this which is about the last thing in terms of investigations I can think to do. He has known venous insufficiency  and venous hypertension and this could be the issue here. The area on the right foot is about the same left buttock slightly worse according to our intake nurse secondary to [facility] Blue sticking to the wound 12/14; biopsy of the left foot that I did last time showed changes that could be related to wound healing/chronic stasis dermatitis phenomenon no neoplasm. We have been using silver alginate to both feet. I change the one on the left today to Sorbact and silver alginate to his other 2 wounds 12/28; the patient arrives with the following problems; ooMajor issue is the dorsal left foot which continues to be [name] larger deeper wound area. Still with [name] completely nonviable surface ooParadoxically the area mirror image on the right on the right dorsal foot appears to be getting better. ooHe had some loss of dry denuded skin from the lower part of his original wound on the left lateral calf. Some of this area looked [name] little vulnerable and for this reason we put him in wrap that on this side this week ooThe area on his left buttock is larger. He still has the erythematous circular area which I think is [name] combination of pressure, sweat. This does not look like cellulitis or fungal dermatitis 11/26/2019; -Dorsal left foot large open wound with depth. Still debris over the surface. Using Sorbact ooThe area on the dorsal right foot paradoxically has closed over [facility] has [name] reopening on the left ankle laterally at the base of his original wound that extended up into the calf. This appears clean. ooThe left buttock wound is smaller but with very adherent necrotic debris over the surface. We have been using silver alginate here as well The patient had arterial studies done in 2017. He had biphasic waveforms at the dorsalis pedis and posterior tibial bilaterally. ABI in the left was 1.17. Digit waveforms were dampened. He has slight spasticity in the great toes I do not think [name] TBI would be  possible 1/11; the patient comes  in today with [name] sizable reopening between the first and second toes on the right. This is not exactly in the same location where we have been treating wounds previously. According to our intake nurse this was actually fairly deep but 0.6 cm. The area on the left dorsal foot looks about the same the surface is somewhat cleaner using Sorbact, his MRI is in 2 days. We have not managed yet to get arterial studies. The new reopening on the left lateral calf looks somewhat better using alginate. The left buttock wound is about the same using alginate 1/18; the patient had his ARTERIAL studies which were quite normal. ABI in the right at 1.13 with triphasic/biphasic waveforms on the left ABI 1.06 again with triphasic/biphasic waveforms. It would not have been possible to have done [name] toe brachial index because of spasticity. We have been using Sorbac to the left foot alginate to the rest of his wounds on the right foot left lateral calf and left buttock 1/25; arrives in clinic with erythema and swelling of the left forefoot worse over the first MTP area. This extends laterally dorsally and but also posteriorly. Still has an area on the left lateral part of the lower part of his calf wound it is eschared and clearly not closed. ooArea on the left buttock still with surrounding irritation and erythema. ooRight foot surface wound dorsally. The area between the right and first and second toes appears better. 2/1; ooThe left foot wound is about the same. Erythema slightly better I gave him [name] week of doxycycline empirically ooRight foot wound is more extensive extending between the toes to the plantar surface ooLeft lateral calf really no open surface on the inferior part of his original wound however the entire area still looks vulnerable ooAbsolutely no improvement in the left buttock wound required debridement. 2/8; the left foot is about the same. Erythema is slightly  improved I gave him clindamycin last week. ooRight foot looks better he is using Lotrimin and silver alginate ooHe has [name] breakdown in the left lateral calf. Denuded epithelium which I have removed ooLeft buttock about the same were using Hydrofera Blue 2/15; left foot is about the same there is less surrounding erythema. Surface still has tightly adherent debris which I have debriding however not making any progress ooRight foot has [name] substantial wound on the medial right second toe between the first and second webspace. ooStill an open area on the left lateral calf distal area. ooButtock wound is about the same 2/22; left foot is about the same less surrounding erythema. Surface has adherent debris. Polymen Ag Right foot area significant wound between the first and second toes. We have been using silver alginate here Left lateral leg polymen Ag at the base of his original venous insufficiency wound ooLeft buttock some improvement here 3/1; ooRight foot is deteriorating in the first second toe webspace. Larger and more substantial. We have been using silver alginate. ooLeft dorsal foot about the same markedly adherent surface debris using PolyMem Ag ooLeft lateral calf surface debris using PolyMem AG ooLeft buttock is improved again using PolyMem Ag. ooHe is completing his terbinafine. The erythema in the foot seems better. He has been on this for 2 weeks 3/8; no improvement in any wound area in fact he has [name] small open area on the dorsal midfoot which is new this week. He has not gotten his foot x-rays yet 3/15; his x-rays were both negative for osteomyelitis of both feet. No major change in  any of his wounds on the extremities however his buttock wounds are better. We have been using polymen on the buttocks, left lower leg. Iodoflex on the left foot and silver alginate on the right 3/22; arrives in clinic today with the 2 major issues are the improvement in the left dorsal foot  wound which for once actually looks healthy with [name] nice healthy wound surface without debridement. Using Iodoflex here. Unfortunately on the left lateral calf which is in the distal part of his original wound he came to the clinic here for there was purulent drainage noted some increased breakdown scattered around the original area and [name] small area proximally. We we are using polymen here will change to silver alginate today. His buttock wound on the left is better and I think the area on the right first second toe webspace is also improved 3/29; left dorsal foot looks better. Using Iodoflex. Left ankle culture from deterioration last time grew E. coli, Enterobacter and Enterococcus. I will give him [name] course of cefdinir although that will not cover Enterococcus. The area on the right foot in the webspace of the first and second toe lateral first toe looks better. The area on his buttock is about healed Vascular appointment is on April 21. This is to look at his venous system vis--vis continued breakdown of the wounds on the left including the left lateral leg and left dorsal foot he. He has had previous ablations on this side 4/5; the area between the right first and second toes lateral aspect of the first toe looks better. Dorsal aspect of the left first toe on the left foot also improved. Unfortunately the left lateral lower leg is larger and there is [name] second satellite wound superiorly. The usual superficial abrasions on the left buttock overall better but certainly not closed 4/12; the area between the right first and second toes is improved. Dorsal aspect of the left foot also slightly smaller with [name] vibrant healthy looking surface. No real change in the left lateral leg and the left buttock wound is healed He has an unaffordable co-pay for Apligraf. Appointment with vein and vascular with regards to the left leg venous part of the circulation is on 4/21 4/19; we continue to see improvement in  all wound areas. Although this is minor. He has his vascular appointment on 4/21. The area on the left buttock has not reopened although right in the center of this area the skin looks somewhat threatened 4/26; the left buttock is unfortunately reopened. In general his left dorsal foot has [name] healthy surface and looks somewhat smaller although it was not measured as such. The area between his first and second toe webspace on the right as [name] small wound against the first toe. The patient saw vascular surgery. The real question I was asking was about the small saphenous vein on the left. He has previously ablated left greater saphenous vein. Nothing further was commented on on the left. Right greater saphenous vein without reflux at the saphenofemoral junction or proximal thigh there was no indication for ablation of the right greater saphenous vein duplex was negative for DVT bilaterally. They did not think there was anything from [name] vascular surgery point of view that could be offered. They ABIs within normal limits 5/3; only small open area on the left buttock. The area on the left lateral leg which was his original venous reflux is now 2 wounds both which look clean. We are using Iodoflex on the left  dorsal foot which looks healthy and smaller. He is down to [name] very tiny area between the right first and second toes, using silver alginate 5/10; all of his wounds appear better. We have much better edema control in 4 layer compression on the left. This may be the factor that is allowing the left foot and left lateral calf to heal. He has external compression garments at home 04/14/20-All of his wounds are progressing well, the left forefoot is practically closed, left ischium appears to be about the same, right toe webspace is also smaller. The left lateral leg is about the same, continue using Hydrofera Blue to this, silver alginate to the ischium, Iodoflex to the toe space on the right 6/7; most of his  wounds outside of the left buttock are doing well. The area on the left lateral calf and left dorsal foot are smaller. The area on the right foot in between the first and second toe webspace is barely visible although he still says there is some drainage here is the only reason I did not heal this out. ooUnfortunately the area on the left buttock almost looks like he has [name] skin tear from tape. He has open wound and then [name] large flap of skin that we are trying to get adherence over an area just next to the remaining wound 6/21; 2 week follow-up. I believe is been here for nurse visits. Miraculously the area between his first and second toes on the left dorsal foot is closed over. Still open on the right first second web space. The left lateral calf has 2 open areas. Distally this is more superficial. The proximal area had [name] little more depth and required debridement of adherent necrotic material. His buttock wound is actually larger we have been using silver alginate here 6/28; the patient's area on the left foot remains closed. Still open wet area between the first and second toes on the right and also extending into the plantar aspect. We have been using silver alginate in this location. He has 2 areas on the left lower leg part of his original long wounds which I think are better. We have been using Hydrofera Blue here. Hydrofera Blue to the left buttock which is stable 7/12; left foot remains closed. Left ankle is closed. May be [name] small area between his right first and second toes the only truly open area is on the left buttock. We have been using Hydrofera Blue here 7/19; patient arrives with marked deterioration especially in the left foot and ankle. We did not put him in [name] compression wrap on the left last week in fact he wore his juxta lite stockings on either side although he does not have an underlying stocking. He has [name] reopening on the left dorsal foot, left lateral ankle and [name] new area on  the right dorsal ankle. More worrisome is the degree of erythema on the left foot extending on the lateral foot into the lateral lower leg on the left 7/26; the patient had erythema and drainage from the lateral left ankle last week. Culture of this grew MRSA resistant to doxycycline and clindamycin which are the 2 antibiotics we usually use with this patient who has multiple antibiotic allergies including linezolid, trimethoprim sulfamethoxazole. I had give him an empiric doxycycline and he comes in the area certainly looks somewhat better although it is blotchy in his lower leg. He has not been systemically unwell. He has had areas on the left dorsal foot which is  [name] reopening, chronic wounds on the left lateral ankle. Both of these I think are secondary to chronic venous insufficiency. The area between his first and second toes is closed as far as I can tell. He had [name] new wrap injury on the right dorsal ankle last week. Finally he has an area on the left buttock. We have been using silver alginate to everything except the left buttock we are using Hydrofera Blue 06/30/20-Patient returns at 1 week, has been given [name] sample dose pack of NUZYRA which is [name] tetracycline derivative [omadacycline], patient has completed those, we have been using silver alginate to almost all the wounds except the left ischium where we are using Hydrofera Blue all of them look better 8/16; since I last saw the patient he has been doing well. The area on the left buttock, left lateral ankle and left foot are all closed today. He has completed the [place] I gave him last time and tolerated this well. He still has open areas on the right dorsal ankle and in the right first second toe area which we are using silver alginate. 8/23; we put him in his bilateral external compression stockings last week as he did not have anything open on either leg except for concerning area between the right first and second toe. He comes in today with  an area on the left dorsal foot slightly more proximal than the original wound, the left lateral foot but this is actually [name] continuation of the area he had on the left lateral ankle from last time. As well he is opened up on the left buttock again. 8/30; comes in today with things looking [name] lot better. The area on the left lower ankle has closed down as has the left foot but with eschar in both areas. The area on the dorsal right ankle is also epithelialized. Very little remaining of the left buttock wound. We have been using silver alginate on all wound areas 9/13; the area in the first second toe webspace on the right has fully epithelialized. He still has some vulnerable epithelium on the right and the ankle and the dorsal foot. He notes weeping. He is using his juxta lite stocking. On the left again the left dorsal foot is closed left lateral ankle is closed. We went to the juxta lite stocking here as well. ooStill vulnerable in the left buttock although only 2 small open areas remain here 9/27; 2-week follow-up. We did not look at his left leg but the patient says everything is closed. He is [name] bit disturbed by the amount of edema in his left foot he is using juxta lite stockings but asking about over the toes stockings which would be 30/40, will talk to him next time. According to him there is no open wound on either the left foot or the left ankle/calf He has an open area on the dorsal right calf which I initially point [name] wrap injury. He has superficial remaining wound on the left ischial tuberosity been using silver alginate although he says this sticks to the wound 10/5; we gave him 2-week follow-up but he called yesterday expressing some concerns about his right foot right ankle and the left buttock. He came in early. There is still no open areas on the left leg and that still in his juxta lite stocking 10/11; he only has 1 small area on the left buttock that remains measuring millimeters 1  mm. Still has the same irritated skin in this area.  We recommended zinc oxide when this eventually closes and pressure relief is meticulously is he can do this. He still has an area on the dorsal part of his right first through third toes which is [name] bit irritated and still open and on the dorsal ankle near the crease of the ankle. We have been using silver alginate and using his own stocking. He has nothing open on the left leg or foot 10/25; 2-week follow-up. Not nearly as good on the left buttock as I was hoping. For open areas with 5 looking threatened small. He has the erythematous irritated chronic skin in this area. oo1 area on the right dorsal ankle. He reports this area bleeds easily ooRight dorsal foot just proximal to the base of his toes ooWe have been using silver alginate. 11/8; 2-week follow-up. Left buttock is about the same although I do not think the wounds are in the same location we have been using silver alginate. I have asked him to use zinc oxide on the skin around the wounds. ooHe still has [name] small area on the right dorsal ankle he reports this bleeds easily ooRight dorsal foot just proximal to the base of the toes does not have anything open although the skin is very dry and scaly ooHe has [name] new opening on the nailbed of the left great toe. Nothing on the left ankle 11/29; 3-week follow-up. Left buttock has 2 open areas. And washing of these wounds today started bleeding easily. Suggesting very friable tissue. We have been using silver alginate. Right dorsal ankle which I thought was initially [name] wrap injury we have been using silver alginate. Nothing open between the toes that I can see. He states the area on the left dorsal toe nailbed healed after the last visit in 2 or 3 days 12/13; 3-week follow-up. His left buttock now has 3 open areas but the original 2 areas are smaller using polymen here. Surrounding skin looks better. The right dorsal ankle is closed. He has [name]  small opening on the right dorsal foot at the level of the third toe. In general the skin looks better here. He is wearing his juxta lite stocking on the left leg says there is nothing open 11/24/2020; 3 weeks follow-up. His left buttock still has the 3 open areas. We have been using polymen but due to lack of response he changed to [facility] area. Surrounding skin is dry erythematous and irritated looking. There is no evidence of infection either bacterial or fungal however there is loss of surface epithelium ooHe still has very dry skin in his foot causing irritation and erythema on the dorsal part of his toes. This is not responded to prolonged courses of antifungal simply looks dry and irritated 1/24; left buttock area still looks about the same he was unable to find the triad ointment that we had suggested. The area on the right lower leg just above the dorsal ankle has reopened and the areas on the right foot between the first second and second third toes and scaling on the bottom of the foot has been about the same for quite some time now. been using silver alginate to all wound areas 2/7; left buttock wound looked quite good although not much smaller in terms of surface area surrounding skin looks better. Only [name] few dry flaking areas on the right foot in between the first and second toes the skin generally looks better here [ammonium lactate]. Finally the area on the right dorsal ankle  is closed 2/21; ooThere is no open area on the right foot even between the right first and second toe. Skin around this area dorsally and plantar aspects look better. ooHe has [name] reopening of the area on the right ankle just above the crease of the ankle dorsally. I continue to think that this is probably friction from spasms may be even this time with his stocking under the compression stockings. ooWounds on his left buttock look about the same there [name] couple of areas that have reopened. He has [name] total  square area of loss of epithelialization. This does not look like infection it looks like [name] contact dermatitis but I just cannot determine to what 3/14; there is nothing on the right foot between the first and second toes this was carefully inspected under illumination. Some chronic irritation on the dorsal part of his foot from toes 1-3 at the base. Nothing really open here substantially. Still has an area on the right foot/ankle that is actually larger and hyper granulated. His buttock area on the left is just about closed however he has chronic inflammation with loss of the surface epithelial layer 3/28; 2-week follow-up. In clinic today with [name] new wound on the left anterior mid tibia. Says this happened about 2 weeks ago. He is not really sure how wonders about the spasticity of his legs at night whether that could have caused this other than that he does not have [name] good idea. He has been using topical antibiotics and silver alginate. The area on his right dorsal ankle seems somewhat better. ooFinally everything on his left buttock is closed. Objective Constitutional Sitting or standing Blood Pressure is within target range for patient.. Pulse regular and within target range for patient.Marland Kitchen Respirations regular, non-labored and within target range.. Temperature is normal and within the target range for the patient.Marland Kitchen Appears in no distress. Vitals Time Taken: 8:13 AM, Height: 70 in, Weight: 216 lbs, BMI: 31, Temperature: 98.3 F, Pulse: 114 bpm, Respiratory Rate: 18 breaths/min, Blood Pressure: 127/80 mmHg. General Notes: Wound exam; left buttock area still angry inflamed skin but no open area. He has been using Coloplast paste ooHe has [name] new open area on the left anterior tibia. This required debridement with [name] #3 curette dry skin debris on the wound surface. ooNo debridement on the right anterior ankle wound this looks somewhat better Integumentary (Hair, Skin) Wound #41R status is Open.  Original cause of wound was Gradually Appeared. The date acquired was: 03/16/2020. The wound has been in treatment 48 weeks. The wound is located on the Left Ischium. The wound measures 0.3cm length x 0.3cm width x 0.1cm depth; 0.071cm^2 area and 0.007cm^3 volume. There is Fat Layer (Subcutaneous Tissue) exposed. There is no tunneling or undermining noted. There is [name] medium amount of serosanguineous drainage noted. The wound margin is distinct with the outline attached to the wound base. There is large (67-100%) red granulation within the wound bed. There is no necrotic tissue within the wound bed. Wound #49 status is Healed - Epithelialized. Original cause of wound was Shear/Friction. The date acquired was: 01/12/2021. The wound has been in treatment 5 weeks. The wound is located on the Left,Proximal Ischium. The wound measures 0cm length x 0cm width x 0cm depth; 0cm^2 area and 0cm^3 volume. There is no tunneling or undermining noted. There is [name] none present amount of drainage noted. There is no granulation within the wound bed. There is no necrotic tissue within the wound bed. Wound #50 status  is Open. Original cause of wound was Gradually Appeared. The date acquired was: 01/12/2021. The wound has been in treatment 5 weeks. The wound is located on the Right,Dorsal Ankle. The wound measures 1cm length x 0.6cm width x 0.1cm depth; 0.471cm^2 area and 0.047cm^3 volume. There is Fat Layer (Subcutaneous Tissue) exposed. There is no tunneling or undermining noted. There is [name] medium amount of serosanguineous drainage noted. The wound margin is distinct with the outline attached to the wound base. There is large (67-100%) red, friable granulation within the wound bed. There is no necrotic tissue within the wound bed. Wound #51 status is Open. Original cause of wound was Trauma. The date acquired was: 01/26/2021. The wound is located on the Left,Anterior Lower Leg. The wound measures 0.7cm length x 0.6cm width x  0.1cm depth; 0.33cm^2 area and 0.033cm^3 volume. There is Fat Layer (Subcutaneous Tissue) exposed. There is no tunneling or undermining noted. There is [name] medium amount of serosanguineous drainage noted. The wound margin is well defined and not attached to the wound base. There is large (67-100%) red granulation within the wound bed. There is no necrotic tissue within the wound bed. 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 There was [name] Excisional Skin/Subcutaneous Tissue Debridement with [name] total area of 0.42 sq cm performed by Ricard [name]., MD. With the following instrument(s): Curette to remove Viable and Non-Viable tissue/material. Material removed includes Subcutaneous Tissue. No specimens were taken. [name] time out was conducted at 08:59, prior to the start of the procedure. [name] Minimum amount of bleeding was controlled with Pressure. The procedure was tolerated well with [name] pain level of 0 throughout and [name] pain level of 0 following the procedure. Post Debridement Measurements: 0.7cm length x 0.6cm width x 0.1cm depth; 0.033cm^3 volume. Character of Wound/Ulcer Post Debridement is improved. Post procedure Diagnosis Wound #51: Same as Pre-Procedure Plan Follow-up Appointments: Return Appointment in 2 weeks. Bathing/ Shower/ Hygiene: May shower and wash wound with soap and water. - on days that dressing is changed Edema Control - Lymphedema / SCD / Other: Elevate legs to the level of the heart or above for 30 minutes daily and/or when sitting, [name] frequency of: - throughout the day Compression stocking or Garment 30-40 mm/Hg pressure to: - Juxtalite  to both legs daily Off-Loading: Roho cushion for wheelchair Turn and reposition every 2 hours WOUND #41R: - Ischium Wound Laterality: Left Cleanser: Soap and Water Every Other Day/30 Days Discharge Instructions: May shower and wash wound with dial antibacterial soap and water prior to dressing change. Prim Dressing: Triad Hydrophilic Wound Dressing Tube, 6 (oz) Every Other Day/30 Days ary Secondary Dressing: ComfortFoam Border, 4x4 in (silicone border) Every Other Day/30 Days Discharge Instructions: Apply over primary dressing as directed. WOUND #50: - Ankle Wound Laterality: Dorsal, Right Cleanser: Wound Cleanser Every Other Day/30 Days Discharge Instructions: Cleanse the wound with wound cleanser prior to applying [name] clean dressing using gauze sponges, not tissue or cotton balls. Cleanser: Soap and Water Every Other Day/30 Days Discharge Instructions: May shower and wash wound with dial antibacterial soap and water prior to dressing change. Prim Dressing: Hydrofera Blue Ready Foam, 2.5 x2.5 in Every Other Day/30 Days ary Discharge Instructions: Apply  to wound bed as instructed Secondary Dressing: ComfortFoam Border, 4x4 in (silicone border) Every Other Day/30 Days Discharge Instructions: Apply over primary dressing as directed. WOUND #51: - Lower Leg Wound Laterality: Left, Anterior Cleanser: Wound Cleanser Every Other Day/30 Days Discharge Instructions: Cleanse the wound with wound cleanser prior to applying [name] clean dressing using gauze sponges, not tissue or cotton balls. Cleanser: Soap and Water Every Other Day/30 Days Discharge Instructions: May shower and wash wound with dial antibacterial soap and water prior to dressing change. Prim Dressing: Hydrofera Blue Ready Foam, 2.5 x2.5 in Every Other Day/30 Days ary Discharge Instructions: Apply to wound bed as instructed Secondary Dressing: ComfortFoam Border, 4x4 in (silicone border) Every Other Day/30 Days Discharge Instructions:  Apply over primary dressing as directed. 1. We used Hydrofera Blue on the right anterior ankle and the new wound on the left anterior mid tibia 2. He has no open area on the left buttock I recommended continuing the Coloplast paste, dry gauze to protect the area on the left buttock that has been recurrent. 3. He is using his own stockings bilaterally on his legs. 4. His original wound on the left lateral calf is still closed everything looks good here Electronic Signature(s) Signed: 02/16/2021 5:22:38 PM By: Linton Ham MD Entered By: Linton Ham on 02/16/2021 09:26:38 -------------------------------------------------------------------------------- SuperBill Details Patient Name: Date of Service: Fierro, [name] 02/16/2021 Medical Record Number: [id number] Patient Account Number: [id number] Date of Birth/Sex: Treating RN: 14-Nov-1986 (35 y.o. [name] Primary Care Provider: [place], [place] Other Clinician: Referring Provider: Treating Provider/Extender: [name]: 267 Diagnosis Coding ICD-10 Codes Code Description I87.332 Chronic venous hypertension (idiopathic) with ulcer and inflammation of left lower extremity L97.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, 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, complete Facility Procedures CPT4 Code: [id number] Description: 88502 - DEB SUBQ TISSUE 20 SQ CM/< ICD-10 Diagnosis Description I87.332 Chronic venous hypertension (idiopathic) with ulcer and inflammation of left l Modifier: ower extremity Quantity: 1 Physician Procedures : CPT4 Code Description Modifier [id number] - WC PHYS SUBQ TISS 20 SQ CM ICD-10 Diagnosis Description I87.332 Chronic venous hypertension (idiopathic) with ulcer and inflammation of left lower extremity Quantity: 1 Electronic Signature(s) Signed: 02/16/2021 5:22:38 PM By: Linton Ham  MD Entered By: Linton Ham on 02/16/2021 09:26:58

## 2021-02-18 NOTE — Progress Notes (Signed)
Lutes, [name] ([id number]) Visit Report for 02/16/2021 Arrival Information Details Patient Name: Date of Service: [name], [name] 02/16/2021 8:00 [name] Medical Record Number: [id number] Patient Account Number: [id number] Date of Birth/Sex: Treating RN: [birth year] (35 y.o. [name] Primary Care Shanekia [name]: [name], [name] Other Clinician: Referring Ervie Mccard: Treating Reianna Batdorf/Extender: [name]: 61 Visit Information History Since Last Visit Added or deleted any medications: No Patient Arrived: Wheel Chair Any new allergies or adverse reactions: No Arrival Time: 08:12 Had [name] fall or experienced change in No Accompanied By: self activities of daily living that may affect Transfer Assistance: None risk of falls: Patient Identification Verified: Yes Signs or symptoms of abuse/neglect since last visito No Secondary Verification Process Completed: Yes Hospitalized since last visit: No Patient Requires Transmission-Based Precautions: No Implantable device outside of the clinic excluding No Patient Has Alerts: Yes cellular tissue based products placed in the center Patient Alerts: R ABI = 1.0 since last visit: L ABI = 1.1 Has Dressing in Place as Prescribed: Yes Pain Present Now: No Electronic Signature(s) Signed: 02/18/2021 9:05:34 AM By: Sandre Kitty Entered By: Sandre Kitty on 02/16/2021 08:13:41 -------------------------------------------------------------------------------- Encounter Discharge Information Details Patient Name: Date of Service: [name], [name] 02/16/2021 8:00 [name] Medical Record Number: [id number] Patient Account Number: [id number] Date of Birth/Sex: Treating RN: 1986/04/27 (35 y.o. [name] Primary Care Jancarlo Biermann: [place], [place] Other Clinician: Referring Eryn Marandola: Treating Kindell Strada/Extender: [name]: 267 Encounter Discharge Information Items Post Procedure  Vitals Discharge Condition: Stable Temperature (F): 98.3 Ambulatory Status: Wheelchair Pulse (bpm): 114 Discharge Destination: Home Respiratory Rate (breaths/min): 18 Transportation: Private Auto Blood Pressure (mmHg): 127/80 Accompanied By: alone Schedule Follow-up Appointment: Yes Clinical Summary of Care: Patient Declined Electronic Signature(s) Signed: 02/16/2021 5:28:50 PM By: Levan Hurst RN, BSN Entered By: Levan Hurst on 02/16/2021 10:12:34 -------------------------------------------------------------------------------- Lower Extremity Assessment Details Patient Name: Date of Service: [name], [name] 02/16/2021 8:00 [name] Medical Record Number: [id number] Patient Account Number: [id number] Date of Birth/Sex: Treating RN: [birth year] (35 y.o. [name] Primary Care Shant Hence: [place], [place] Other Clinician: Referring Semiah Konczal: Treating Rhett Najera/Extender: Dutch Gray, [name]: 267 Edema Assessment Assessed: [Left: Yes] [Right: No] Edema: [Left: Yes] [Right: Yes] Calf Left: Right: Point of Measurement: 33 cm From Medial Instep 34 cm 32 cm Ankle Left: Right: Point of Measurement: 10 cm From Medial Instep 24 cm 24 cm Vascular Assessment Pulses: Dorsalis Pedis Palpable: [Left:Yes] [Right:Yes] Electronic Signature(s) Signed: 02/16/2021 5:20:59 PM By: Lorrin Jackson Entered By: Lorrin Jackson on 02/16/2021 08:26:21 -------------------------------------------------------------------------------- Multi Wound Chart Details Patient Name: Date of Service: [name], [name] 02/16/2021 8:00 [name] Medical Record Number: [id number] Patient Account Number: [id number] Date of Birth/Sex: Treating RN: [birth year] (35 y.o. [name] Primary Care Raynetta Osterloh: [name], [name] Other Clinician: Referring Britlyn Martine: Treating Naika Noto/Extender: [name]: 267 Vital Signs Height(in): 70 Pulse(bpm):  114 Weight(lbs): 216 Blood Pressure(mmHg): 127/80 Body Mass Index(BMI): 31 Temperature(F): 98.3 Respiratory Rate(breaths/min): 18 Photos: [41R:No Photos Left Ischium] [49:No Photos Left, Proximal Ischium] [50:No Photos Right, Dorsal Ankle] Wound Location: [41R:Gradually Appeared] [49:Shear/Friction] [50:Gradually Appeared] Wounding Event: [41R:Pressure Ulcer] [49:Pressure Ulcer] [50:Venous Leg Ulcer] Primary Etiology: [41R:Sleep Apnea, Hypertension, Paraplegia Sleep Apnea, Hypertension, Paraplegia Sleep Apnea, Hypertension, Paraplegia] Comorbid History: [41R:03/16/2020] [49:01/12/2021] [50:01/12/2021] Date Acquired: [41R:48] [49:5] [50:5] Weeks of Treatment: [41R:Open] [49:Healed - Epithelialized] [50:Open] Wound Status: [41R:Yes] [49:No] [50:No] Wound Recurrence: [41R:Yes] [49:No] [50:No] Clustered Wound: [41R:2] [49:[name]] [50:[name]] Clustered Quantity: [  41R:0.3x0.3x0.1] [49:0x0x0] [50:1x0.6x0.1] Measurements L x W x D (cm) [41R:0.071] [49:0] [50:0.471] [name] (cm) : rea [41R:0.007] [49:0] [50:0.047] Volume (cm) : [41R:96.10%] [49:100.00%] [50:75.00%] % Reduction in Area: [41R:96.10%] [49:100.00%] [50:87.50%] % Reduction in Volume: [41R:Category/Stage II] [49:Category/Stage III] [50:Full Thickness Without Exposed] Classification: [41R:Medium] [49:None Present] [50:Support Structures Medium] Exudate [name] mount: [41R:Serosanguineous] [49:[name]] [50:Serosanguineous] Exudate Type: [41R:red, brown] [49:[name]] [50:red, brown] Exudate Color: [41R:Distinct, outline attached] [49:[name]] [50:Distinct, outline attached] Wound Margin: [41R:Large (67-100%)] [49:None Present (0%)] [50:Large (67-100%)] Granulation [name] mount: [41R:Red] [49:[name]] [50:Red, Friable] Granulation Quality: [41R:None Present (0%)] [49:None Present (0%)] [50:None Present (0%)] Necrotic [name] mount: [41R:Fat Layer (Subcutaneous Tissue): Yes Fascia: No] [50:Fat Layer (Subcutaneous Tissue): Yes] Exposed Structures: [41R:Fascia: No Tendon: No  Muscle: No Joint: No Bone: No Large (67-100%)] [49:Fat Layer (Subcutaneous Tissue): No Fascia: No Tendon: No Muscle: No Joint: No Bone: No Large (67-100%)] [50:Tendon: No Muscle: No Joint: No Bone: No Small (1-33%)] Epithelialization: [41R:[name]] [49:[name]] [50:[name]] Debridement: [41R:[name]] [49:[name]] [50:[name]] Tissue Debrided: [41R:[name]] [49:[name]] [50:[name]] Level: [41R:[name]] [49:[name]] [50:[name]] Debridement [name] (sq cm): [41R:rea [name]] [49:[name]] [50:[name]] Instrument: [41R:[name]] [49:[name]] [50:[name]] Bleeding: [41R:[name]] [49:[name]] [50:[name]] Hemostasis [name] chieved: [41R:[name]] [49:[name]] [50:[name]] Procedural Pain: [41R:[name]] [49:[name]] [50:[name]] Post Procedural Pain: Debridement Treatment Response: [name] [49:[name]] [50:[name]] Post Debridement Measurements L x [name] [49:[name]] [50:[name]] W x D (cm) [41R:[name]] [49:[name]] [50:[name]] Post Debridement Volume: (cm) [41R:[name]] [49:[name]] [50:[name]] Wound Number: 51 [name] [name] Photos: No Photos [name] [name] Left, Anterior Lower Leg [name] [name] Wound Location: Trauma [name] [name] Wounding Event: Trauma, Other [name] [name] Primary Etiology: Sleep Apnea, Hypertension, Paraplegia [name] [name] Comorbid History: 01/26/2021 [name] [name] Date Acquired: 0 [name] [name] Weeks of Treatment: Open [name] [name] Wound Status: No [name] [name] Wound Recurrence: No [name] [name] Clustered Wound: [name] [name] [name] Clustered Quantity: 0.7x0.6x0.1 [name] [name] Measurements L x W x D (cm) 0.33 [name] [name] [name] (cm) : rea 0.033 [name] [name] Volume (cm) : [name] [name] [name] % Reduction in [name] rea: [name] [name] [name] % Reduction in Volume: Full Thickness Without Exposed [name] [name] Classification: Support Structures Medium [name] [name] Exudate [name] mount: Serosanguineous [name] [name] Exudate Type: red, brown [name] [name] Exudate Color: Well defined, not attached [name] [name] Wound Margin: Large (67-100%) [name] [name] Granulation [name] mount: Red [name] [name] Granulation Quality: None Present (0%) [name] [name] Necrotic [name] mount: Fat Layer (Subcutaneous Tissue): Yes [name] [name] Exposed Structures: Fascia: No Tendon: No Muscle:  No Joint: No Bone: No None [name] [name] Epithelialization: Debridement - Excisional [name] [name] Debridement: Pre-procedure Verification/Time Out 08:59 [name] [name] Taken: Subcutaneous [name] [name] Tissue Debrided: Skin/Subcutaneous Tissue [name] [name] Level: 0.42 [name] [name] Debridement [name] (sq cm): rea Curette [name] [name] Instrument: Minimum [name] [name] Bleeding: Pressure [name] [name] Hemostasis [name] chieved: 0 [name] [name] Procedural Pain: 0 [name] [name] Post Procedural Pain: Procedure was tolerated well [name] [name] Debridement Treatment Response: 0.7x0.6x0.1 [name] [name] Post Debridement Measurements L x W x D (cm) 0.033 [name] [name] Post Debridement Volume: (cm) Debridement [name] [name] Procedures Performed: Treatment Notes Electronic Signature(s) Signed: 02/16/2021 5:22:38 PM By: Linton Ham MD Signed: 02/16/2021 5:28:50 PM By: Levan Hurst RN, BSN Entered By: Linton Ham on 02/16/2021 09:22:36 -------------------------------------------------------------------------------- Multi-Disciplinary Care Plan Details Patient Name: Date of Service: [place], [name] 02/16/2021 8:00 [name] Medical Record Number: [id number] Patient Account Number: [id number] Date of Birth/Sex: Treating RN: 1986/02/21 (35 y.o. [name] Primary Care Braylee Bosher: [place], [place] Other Clinician: Referring Hopelynn Gartland: Treating Aydia Maj/Extender: [name]: [place] reviewed with physician Active Inactive Wound/Skin Impairment  Nursing Diagnoses: Impaired tissue integrity Knowledge deficit related to ulceration/compromised skin integrity Goals: Patient/caregiver will verbalize understanding of skin care regimen Date Initiated: 01/05/2016 Target Resolution Date: 03/09/2021 Goal Status: Active Ulcer/skin breakdown will have [name] volume reduction of 30% by week 4 Date Initiated: 01/05/2016 Date Inactivated: 12/22/2017 Target Resolution Date: 01/19/2018 Unmet Reason: complex wounds, Goal Status:  Unmet infection Interventions: Assess patient/caregiver ability to obtain necessary supplies Assess ulceration(s) every visit Provide education on ulcer and skin care Notes: 02/02/21: Complex Care, ongoing. Electronic Signature(s) Signed: 02/16/2021 5:28:50 PM By: Levan Hurst RN, BSN Entered By: Levan Hurst on 02/16/2021 08:53:56 -------------------------------------------------------------------------------- Pain Assessment Details Patient Name: Date of Service: [place], [name] 02/16/2021 8:00 [name] Medical Record Number: [id number] Patient Account Number: [id number] Date of Birth/Sex: Treating RN: May 02, 1986 (35 y.o. [name] Primary Care Ashlee Player: [place], [place] Other Clinician: Referring [place]ford Shannahan: Treating Grizel Vesely/Extender: [name]: 267 Active Problems Location of Pain Severity and Description of Pain Patient Has Paino No Site Locations Pain Management and Medication Current Pain Management: Electronic Signature(s) Signed: 02/16/2021 5:28:50 PM By: Levan Hurst RN, BSN Signed: 02/18/2021 9:05:34 AM By: Sandre Kitty Entered By: Sandre Kitty on 02/16/2021 08:14:06 -------------------------------------------------------------------------------- Patient/Caregiver Education Details Patient Name: Date of Service: [place], [name] Viviann Spare 3/28/2022andnbsp8:00 [name] Medical Record Number: [id number] Patient Account Number: [id number] Date of Birth/Gender: Treating RN: [birth year] (35 y.o. [name] Primary Care Physician: Janine Limbo Other Clinician: Referring Physician: Treating Physician/Extender: [name]: 56 Education Assessment Education Provided To: Patient Education Topics Provided Wound/Skin Impairment: Methods: Explain/Verbal Responses: State content correctly Electronic Signature(s) Signed: 02/16/2021 5:28:50 PM By: Levan Hurst RN, BSN Entered By: Levan Hurst on 02/16/2021 09:01:13 -------------------------------------------------------------------------------- Wound Assessment Details Patient Name: Date of Service: [place], [name] 02/16/2021 8:00 [name] Medical Record Number: [id number] Patient Account Number: [id number] Date of Birth/Sex: Treating RN: 1986/07/29 (35 y.o. [name] Primary Care Yaire Kreher: [name], [name] Other Clinician: Referring Trever Streater: Treating Jazziel Fitzsimmons/Extender: [name]: 267 Wound Status Wound Number: [id number] Primary Etiology: Pressure Ulcer Wound Location: Left Ischium Wound Status: Open Wounding Event: Gradually Appeared Comorbid History: Sleep Apnea, Hypertension, Paraplegia Date Acquired: 03/16/2020 Weeks Of Treatment: 48 Clustered Wound: Yes Photos Wound Measurements Length: (cm) 0.3 Width: (cm) 0.3 Depth: (cm) 0.1 Clustered Quantity: 2 Area: (cm) 0.071 Volume: (cm) 0.007 % Reduction in Area: 96.1% % Reduction in Volume: 96.1% Epithelialization: Large (67-100%) Tunneling: No Undermining: No Wound Description Classification: Category/Stage II Wound Margin: Distinct, outline attached Exudate Amount: Medium Exudate Type: Serosanguineous Exudate Color: red, brown Foul Odor After Cleansing: No Slough/Fibrino No Wound Bed Granulation Amount: Large (67-100%) Exposed Structure Granulation Quality: Red Fascia Exposed: No Necrotic Amount: None Present (0%) Fat Layer (Subcutaneous Tissue) Exposed: Yes Tendon Exposed: No Muscle Exposed: No Joint Exposed: No Bone Exposed: No Treatment Notes Wound #41R (Ischium) Wound Laterality: Left Cleanser Soap and Water Discharge Instruction: May shower and wash wound with dial antibacterial soap and water prior to dressing change. Peri-Wound Care Topical Primary Dressing Triad Hydrophilic Wound Dressing Tube, 6 (oz) Secondary Dressing ComfortFoam Border, 4x4 in (silicone border) Discharge Instruction:  Apply over primary dressing as directed. Secured With Compression Wrap Compression Stockings Environmental education officer) Signed: 02/16/2021 5:28:50 PM By: Levan Hurst RN, BSN Signed: 02/18/2021 9:05:34 AM By: Sandre Kitty Entered By: Sandre Kitty on 02/16/2021 16:48:18 -------------------------------------------------------------------------------- Wound Assessment Details Patient Name: Date of Service: [place], [name] 02/16/2021 8:00 [name] Medical Record Number:  [id number] Patient Account Number: [id number] Date of Birth/Sex: Treating RN: 05-25-1986 (35 y.o. [name] Primary Care Ebonique Hallstrom: [place], [place] Other Clinician: Referring Deepika Dec[place]: Treating Blayze Haen/Extender: [name]: 267 Wound Status Wound Number: 49 Primary Etiology: Pressure Ulcer Wound Location: Left, Proximal Ischium Wound Status: Healed - Epithelialized Wounding Event: Shear/Friction Comorbid History: Sleep Apnea, Hypertension, Paraplegia Date Acquired: 01/12/2021 Weeks Of Treatment: 5 Clustered Wound: No Wound Measurements Length: (cm) Width: (cm) Depth: (cm) Area: (cm) Volume: (cm) 0 % Reduction in Area: 100% 0 % Reduction in Volume: 100% 0 Epithelialization: Large (67-100%) 0 Tunneling: No 0 Undermining: No Wound Description Classification: Category/Stage III Exudate Amount: None Present Wound Bed Granulation Amount: None Present (0%) Exposed Structure Necrotic Amount: None Present (0%) Fascia Exposed: No Fat Layer (Subcutaneous Tissue) Exposed: No Tendon Exposed: No Muscle Exposed: No Joint Exposed: No Bone Exposed: No Electronic Signature(s) Signed: 02/16/2021 5:20:59 PM By: Lorrin Jackson Signed: 02/16/2021 5:28:50 PM By: Levan Hurst RN, BSN Entered By: Levan Hurst on 02/16/2021 09:02:40 -------------------------------------------------------------------------------- Wound Assessment Details Patient Name: Date of  Service: [place], [name] 02/16/2021 8:00 [name] Medical Record Number: [id number] Patient Account Number: [id number] Date of Birth/Sex: Treating RN: [birth year] (35 y.o. [name] Primary Care Johnmark Geiger: Other Clinician: Janine Limbo Referring Tijuan Dantes: Treating Keyontay Stolz/Extender: [name]: 267 Wound Status Wound Number: 50 Primary Etiology: Venous Leg Ulcer Wound Location: Right, Dorsal Ankle Wound Status: Open Wounding Event: Gradually Appeared Comorbid History: Sleep Apnea, Hypertension, Paraplegia Date Acquired: 01/12/2021 Weeks Of Treatment: 5 Clustered Wound: No Photos Wound Measurements Length: (cm) 1 Width: (cm) 0.6 Depth: (cm) 0.1 Area: (cm) 0.471 Volume: (cm) 0.047 % Reduction in Area: 75% % Reduction in Volume: 87.5% Epithelialization: Small (1-33%) Tunneling: No Undermining: No Wound Description Classification: Full Thickness Without Exposed Support Structures Wound Margin: Distinct, outline attached Exudate Amount: Medium Exudate Type: Serosanguineous Exudate Color: red, brown Foul Odor After Cleansing: No Slough/Fibrino No Wound Bed Granulation Amount: Large (67-100%) Exposed Structure Granulation Quality: Red, Friable Fascia Exposed: No Necrotic Amount: None Present (0%) Fat Layer (Subcutaneous Tissue) Exposed: Yes Tendon Exposed: No Muscle Exposed: No Joint Exposed: No Bone Exposed: No Treatment Notes Wound #50 (Ankle) Wound Laterality: Dorsal, Right Cleanser Wound Cleanser Discharge Instruction: Cleanse the wound with wound cleanser prior to applying [name] clean dressing using gauze sponges, not tissue or cotton balls. Soap and Water Discharge Instruction: May shower and wash wound with dial antibacterial soap and water prior to dressing change. Peri-Wound Care Topical Primary Dressing Hydrofera Blue Ready Foam, 2.5 x2.5 in Discharge Instruction: Apply to wound bed as instructed Secondary  Dressing ComfortFoam Border, 4x4 in (silicone border) Discharge Instruction: Apply over primary dressing as directed. Secured With Compression Wrap Compression Stockings Environmental education officer) Signed: 02/16/2021 5:20:59 PM By: Lorrin Jackson Signed: 02/18/2021 9:05:34 AM By: Sandre Kitty Entered By: Sandre Kitty on 02/16/2021 16:47:35 -------------------------------------------------------------------------------- Wound Assessment Details Patient Name: Date of Service: [place], [name] 02/16/2021 8:00 [name] Medical Record Number: [id number] Patient Account Number: [id number] Date of Birth/Sex: Treating RN: [birth year] (36 y.o. [name] Primary Care Rayla Pember: [name], [name] Other Clinician: Referring Momin Misko: Treating Veryl Abril/Extender: [name]: 267 Wound Status Wound Number: 51 Primary Etiology: Trauma, Other Wound Location: Left, Anterior Lower Leg Wound Status: Open Wounding Event: Trauma Comorbid History: Sleep Apnea, Hypertension, Paraplegia Date Acquired: 01/26/2021 Weeks Of Treatment: 0 Clustered Wound: No Photos Wound Measurements Length: (cm) 0.7 Width: (cm) 0.6 Depth: (cm) 0.1 Area: (  cm) 0.33 Volume: (cm) 0.033 % Reduction in Area: 0% % Reduction in Volume: 0% Epithelialization: None Tunneling: No Undermining: No Wound Description Classification: Full Thickness Without Exposed Support Structures Wound Margin: Well defined, not attached Exudate Amount: Medium Exudate Type: Serosanguineous Exudate Color: red, brown Foul Odor After Cleansing: No Slough/Fibrino No Wound Bed Granulation Amount: Large (67-100%) Exposed Structure Granulation Quality: Red Fascia Exposed: No Necrotic Amount: None Present (0%) Fat Layer (Subcutaneous Tissue) Exposed: Yes Tendon Exposed: No Muscle Exposed: No Joint Exposed: No Bone Exposed: No Treatment Notes Wound #51 (Lower Leg) Wound Laterality: Left,  Anterior Cleanser Wound Cleanser Discharge Instruction: Cleanse the wound with wound cleanser prior to applying [name] clean dressing using gauze sponges, not tissue or cotton balls. Soap and Water Discharge Instruction: May shower and wash wound with dial antibacterial soap and water prior to dressing change. Peri-Wound Care Topical Primary Dressing Hydrofera Blue Ready Foam, 2.5 x2.5 in Discharge Instruction: Apply to wound bed as instructed Secondary Dressing ComfortFoam Border, 4x4 in (silicone border) Discharge Instruction: Apply over primary dressing as directed. Secured With Compression Wrap Compression Stockings Environmental education officer) Signed: 02/16/2021 5:20:59 PM By: Lorrin Jackson Signed: 02/18/2021 9:05:34 AM By: Sandre Kitty Entered By: Sandre Kitty on 02/16/2021 16:46:59 -------------------------------------------------------------------------------- [place] Details Patient Name: Date of Service: [place], [name] 02/16/2021 8:00 [name] Medical Record Number: [id number] Patient Account Number: [id number] Date of Birth/Sex: Treating RN: 19-Oct-1986 (35 y.o. [name] Primary Care Micajah Dennin: [place], [place] Other Clinician: Referring Haneef Hallquist: Treating Tyan Dy/Extender: [name]: 267 Vital Signs Time Taken: 08:13 Temperature (F): 98.3 Height (in): 70 Pulse (bpm): 114 Weight (lbs): 216 Respiratory Rate (breaths/min): 18 Body Mass Index (BMI): 31 Blood Pressure (mmHg): 127/80 Reference Range: 80 - 120 mg / dl Electronic Signature(s) Signed: 02/18/2021 9:05:34 AM By: Sandre Kitty Entered By: Sandre Kitty on 02/16/2021 08:13:59

## 2021-03-02 ENCOUNTER — Other Ambulatory Visit: Payer: Self-pay

## 2021-03-02 ENCOUNTER — Encounter (HOSPITAL_BASED_OUTPATIENT_CLINIC_OR_DEPARTMENT_OTHER): Payer: 59 | Attending: Internal Medicine | Admitting: Internal Medicine

## 2021-03-02 DIAGNOSIS — Z87891 Personal history of nicotine dependence: Secondary | ICD-10-CM | POA: Insufficient documentation

## 2021-03-02 DIAGNOSIS — Z89422 Acquired absence of other left toe(s): Secondary | ICD-10-CM | POA: Diagnosis not present

## 2021-03-02 DIAGNOSIS — L97929 Non-pressure chronic ulcer of unspecified part of left lower leg with unspecified severity: Secondary | ICD-10-CM | POA: Diagnosis present

## 2021-03-02 DIAGNOSIS — G8221 Paraplegia, complete: Secondary | ICD-10-CM | POA: Insufficient documentation

## 2021-03-02 DIAGNOSIS — L89323 Pressure ulcer of left buttock, stage 3: Secondary | ICD-10-CM | POA: Insufficient documentation

## 2021-03-02 DIAGNOSIS — L97511 Non-pressure chronic ulcer of other part of right foot limited to breakdown of skin: Secondary | ICD-10-CM | POA: Insufficient documentation

## 2021-03-02 DIAGNOSIS — I87332 Chronic venous hypertension (idiopathic) with ulcer and inflammation of left lower extremity: Secondary | ICD-10-CM | POA: Diagnosis not present

## 2021-03-02 NOTE — Progress Notes (Signed)
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 Iodoflex to the areas on his first and second toes/web space bilaterally 06/24/17; patient with history of paraplegia also chronic venous insufficiency with lymphedema. Has [name] very difficult wound on the left lateral leg. This has been gradually reducing in terms of with but comes in with [name] very dry adherent surface. High switch to silver collagen [name] week or so ago with hydrogel to keep the area moist. This is been refractory to multiple dressing attempts. He also has areas in his first and second toes bilaterally in the anterior and posterior web space. I had been using Iodoflex here after [name] prolonged course of silver alginate with ketoconazole was ineffective [question tinea pedis] 07/14/17; patient arrives today with [name] very difficult adherent material over his left lateral lower leg wound. He also has surrounding erythema and poorly controlled edema. He was switched his Santyl last visit which the nurses are applying once during his doctor visit and once on [name] nurse visit. He was also reduced to 2 layer compression I'[name] not exactly sure of the issue here. 07/21/17; better surface today after 1 week of Iodoflex. Significant cellulitis that we treated last week also better. [Doxycycline] 07/28/17 better surface today with now 2 weeks of Iodoflex. Significant cellulitis treated with doxycycline. He has now completed the  doxycycline and he is back to his usual degree of chronic venous inflammation/stasis dermatitis. He reminds me he has had ablations surgery here 08/04/17; continued improvement with Iodoflex to the left lateral leg wound in terms of the surface of the  wound although the dimensions are better. 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 Problematic areas on the plantar aspect of the first second toe web space on the left and the dorsal aspect of the first second toe web space on the right. At one point I felt these were probably related to chronic fungal infections in treated him aggressively for this although we have not made any improvement here. 08/11/17; left lateral leg. Surface continues to improve with the Iodoflex although we are not seeing much improvement in overall wound dimensions. Areas on his plantar left foot and right foot show no improvement. In fact the right foot looks somewhat worse 08/18/17; left lateral leg. We changed to [facility] Blue last week after [name] prolonged course of Iodoflex which helps get the surface better. It appears that the wound with is improved. Continue with difficult areas on the left dorsal first second and plantar first second on the right 09/01/17; patient arrives in clinic today having had [name] temperature of 103 yesterday. He was seen in the ER and [facility]. The patient was concerned he could have cellulitis again in the right leg however they diagnosed him with [name] UTI and he is now on Keflex. He has [name] history of cellulitis which is been recurrent and difficult but this is been in the left leg, in the past 5 use doxycycline. He does in and out catheterizations at home which are risk factors for UTI 09/08/17; patient will be completing his Keflex this weekend. The erythema on the left leg is considerably better. He has [name] new wound today on the medial part of the right leg small superficial almost looks like [name] skin tear. He has worsening of the area on the right dorsal first and second toe. His major area on the left lateral leg is better. Using Hydrofera Blue on all areas 09/15/17; gradual reduction in width on the long wound in the left  lateral leg. No debridement required. He also has wounds on the plantar aspect of his left first second toe web space and on the dorsal aspect of the right first second toe web space. 09/22/17; there continues to be very gradual improvements in the dimensions of the left lateral leg wound. He hasn't round erythematous spot with might be pressure on his wheelchair. There is no evidence obviously of infection no purulence no warmth He has [name] dry scaled area on the plantar aspect of the left first second toe Improved area on the dorsal right first second toe. 09/29/17; left lateral leg wound continues to improve in dimensions mostly with an is still [name] fairly long but increasingly narrow wound. He has [name] dry scaled area on the plantar aspect of his left first second toe web space Increasingly concerning area on the dorsal right first second toe. In fact I am concerned today about possible cellulitis around this wound. The areas extending up his second toe and although there is deformities here almost appears to abut on the nailbed. 10/06/17; left lateral leg wound continues to make very gradual progress. Tissue culture I did from the right first second toe dorsal foot last time grew MRSA and enterococcus which was vancomycin sensitive. This was not sensitive  to clindamycin or doxycycline. He is allergic to Zyvox and sulfa we have therefore arrange for him to have dalvance infusion tomorrow. He is had this in the past and tolerated it well 10/20/17; left lateral leg wound continues to make decent progress. This is certainly reduced in terms of with there is advancing epithelialization.The cellulitis in the right foot looks better although he still has [name] deep wound in the dorsal aspect of the first second toe web space. Plantar left first toe web space on the left I think is making some progress 10/27/17; left lateral leg wound continues to make decent progress. Advancing epithelialization.using Hydrofera  Blue The right first second toe web space wound is better-looking using silver alginate Improvement in the left plantar first second toe web space. Again using silver alginate 11/03/17 left lateral leg wound continues to make decent progress albeit slowly. Using [facility] The right per second toe web space continues to be [name] very problematic looking punched out wound. I obtained [name] piece of tissue for deep culture I did extensively treated this for fungus. It is difficult to imagine that this is [name] pressure area as the patient states other than going outside he doesn't really wear shoes at home The left plantar first second toe web space looked fairly senescent. Necrotic edges. This required debridement change to [facility] Blue to all wound areas 11/10/17; left lateral leg wound continues to contract. Using Hydrofera Blue On the right dorsal first second toe web space dorsally. Culture I did of this area last week grew MRSA there is not an easy oral option in this patient was multiple antibiotic allergies or intolerances. This was only [name] rare culture isolate I'[name] therefore going to use Bactroban under silver alginate On the left plantar first second toe web space. Debridement is required here. This is also unchanged 11/17/17; left lateral leg wound continues to contract using Hydrofera Blue this is no longer the major issue. The major concern here is the right first second toe web space. He now has an open area going from dorsally to the plantar aspect. There is now wound on the inner lateral part of the first toe. Not [name] very viable surface on this. There is erythema spreading medially into the forefoot. No major change in the left first second toe plantar wound 11/24/17; left lateral leg wound continues to contract using Hydrofera Blue. Nice improvement today The right first second toe web space all of this looks [name] lot less angry than last week. I have given him clindamycin and topical Bactroban  for MRSA and terbinafine for the possibility of underlining tinea pedis that I could not control with ketoconazole. Looks somewhat better The area on the plantar left first second toe web space is weeping with dried debris around the wound 12/01/17; left lateral leg wound continues to contract he Hydrofera Blue. It is becoming thinner in terms of with nevertheless it is making good improvement. The right first second toe web space looks less angry but still [name] large necrotic-looking wounds starting on the plantar aspect of the right foot extending between the toes and now extensively on the base of the right second toe. I gave him clindamycin and topical Bactroban for MRSA anterior benefiting for the possibility of underlying tinea pedis. Not looking better today The area on the left first/second toe looks better. Debrided of necrotic debris 12/05/17* the patient was worked in urgently today because over the weekend he found blood on his incontinence bad when he woke  up. He was found to have an ulcer by his wife who does most of his wound care. He came in today for [place] to look at this. He has not had [name] history of wounds in his buttocks in spite of his paraplegia. 12/08/17; seen in follow-up today at his usual appointment. He was seen earlier this week and found to have [name] new wound on his buttock. We also follow him for wounds on the left lateral leg, left first second toe web space and right first second toe web space 12/15/17; we have been using Hydrofera Blue to the left lateral leg which has improved. The right first second toe web space has also improved. Left first second toe web space plantar aspect looks stable. The left buttock has worsened using Santyl. Apparently the buttock has drainage 12/22/17; we have been using Hydrofera Blue to the left lateral leg which continues to improve now 2 small wounds separated by normal skin. He tells [place] he had [name] fever up to 100 yesterday he is prone to UTIs but  has not noted anything different. He does in and out catheterizations. The area between the first and second toes today does not look good necrotic surface covered with what looks to be purulent drainage and erythema extending into the third toe. I had gotten this to something that I thought look better last time however it is not look good today. He also has [name] necrotic surface over the buttock wound which is expanded. I thought there might be infection under here so I removed [name] lot of the surface with [name] #5 curet though nothing look like it really needed culturing. He is been using Santyl to this area 12/27/17; his original wound on the left lateral leg continues to improve using Hydrofera Blue. I gave him samples of Baxdella although he was unable to take them out of fear for an allergic reaction ["lump in his throat"].the culture I did of the purulent drainage from his second toe last week showed both enterococcus and [name] set Enterobacter I was also concerned about the erythema on the bottom of his foot although paradoxically although this looks somewhat better today. 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 We've been using silver alginate here. They came up today that he sleeps in [name] recliner, I'[name] not sure why but I asked him to stop this 01/03/18; his original wound we've been using Hydrofera Blue is now separated into 2 areas. Ulcer on his left buttock is better he is off the recliner and sleeping in bed Finally both wound areas between his first and second toes also looks some better 01/10/18; his original wound on the left lateral leg is now separated into 2 wounds we've been using Hydrofera Blue Ulcer on his left buttock has some drainage. There is [name] small probing site going into muscle layer superiorly.using silver alginate -He arrives today with [name] deep tissue injury on the left heel The wound on the  dorsal aspect of his first second toe on the left looks [name] lot betterusing silver alginate ketoconazole The area on the first second toe web space on the right also looks [name] lot bette 01/17/18; his original wound on the left lateral leg continues to progress using Hydrofera Blue Ulcer on his left buttock also is smaller surface healthier except for [name] small probing site going into the muscle layer superiorly. 2.4 cm of tunneling in  this area DTI on his left heel we have only been offloading. Looks better than last week no threatened open no evidence of infection the wound on the dorsal aspect of the first second toe on the left continues to look like it's regressing we have only been using silver alginate and terbinafine orally The area in the first second toe web space on the right also looks to be [name] lot better using silver alginate and terbinafine I think this was prompted by tinea pedis 01/31/18; the patient was hospitalized in [place] last week apparently for [name] complicated UTI. He was discharged on cefepime he does in and out catheterizations. In the hospital he was discovered [name] I don't mild elevation of AST and ALT and the terbinafine was stopped.predictably the pressure ulcer on s his buttock looks betterusing silver alginate. The area on the left lateral leg also is better using Hydrofera Blue. The area between the first and second toes on the left better. First and second toes on the right still substantial but better. Finally the DTI on the left heel has held together and looks like it's resolving 02/07/18-he is here in follow-up evaluation for multiple ulcerations. He has new injury to the lateral aspect of the last issue [name] pressure ulcer, he states this is from adhesive removal trauma. He states he has tried multiple adhesive products with no success. All other ulcers appear stable. The left heel DTI is resolving. We will continue with same treatment plan and follow-up next week. 02/14/18;  follow-up for multiple areas. He has [name] new area last week on the lateral aspect of his pressure ulcer more over the posterior trochanter. The original pressure ulcer looks quite stable has healthy granulation. 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 Almost fully epithelialized on the original superior area using Hydrofera Blue DTI on the left heel has peeled off this week to reveal [name] small superficial wound under denuded skin and subcutaneous tissue Both areas between the first and second toes look better including nothing open on the left 02/21/18; The patient's wounds on his left ischial tuberosity and posterior left greater trochanter actually looked better. He has [name] large area of irritation around the area which I think is contact dermatitis. I am doubtful that this is fungal His original wound on the left lateral calf continues to improve we have been using Hydrofera Blue There is no open area in the left first second toe web space although there is [name] lot of thick callus The DTI on the left heel required debridement today of necrotic surface eschar and subcutaneous tissue using silver alginate Finally the area on the right first second toe webspace continues to contract using silver alginate and ketoconazole 02/28/18 Left ischial tuberosity wounds look better using silver alginate. Original wound on the left calf only has one small open area left using Hydrofera Blue DTI on the left heel required debridement mostly removing skin from around this wound surface. Using silver alginate The areas on the right first/second toe web space using silver alginate and ketoconazole 03/08/18 on evaluation today patient appears to be doing decently well as best I can tell in regard to his wounds. This is the first time that I have seen him as he generally is followed by Dr. Dellia Nims. With that being said  none of his wounds appear to be infected he does have an area where there is some  skin covering what appears to be [name] new wound on the left dorsal surface of his great toe. This is right at the nail bed. With that being said I do believe that debrided away some of the excess skin can be of benefit in this regard. Otherwise he has been tolerating the dressing changes without complication. 03/14/18; patient arrives today with the multiplicity of wounds that we are following. He has not been systemically unwell Original wound on the left lateral calf now only has 2 small open areas we've been using Hydrofera Blue which should continue The deep tissue injury on the left heel requires debridement today. We've been using silver alginate The left first second toe and the right first second toe are both are reminiscence what I think was tinea pedis. Apparently some of the callus Surface between the toes was removed last week when it started draining. Purulent drainage coming from the wound on the ischial tuberosity on the left. 03/21/18-He is here in follow-up evaluation for multiple wounds. 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 He tolerated debridement. The only change to last week's recommendations is to discontinue antifungal cream between toes. He will follow-up next week 03/28/18; following up for multiple wounds;Concern this week is streaking redness and swelling in the right foot. He is going to need antibiotics for this. 03/31/18; follow-up for right foot cellulitis. Streaking redness and swelling in the right foot on 03/28/18. 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 I put him on clindamycin 300 mg every 6 and brought him in for [name] quick check. He has an open wound between his first and second toes on the right foot as [name] potential source. 04/04/18; Right foot cellulitis is resolving he is completing  clindamycin. This is truly good news Left lateral calf wound which is initial wound only has one small open area inferiorly this is close to healing out. He has compression stockings. We will use Hydrofera Blue right down to the epithelialization of this Nonviable surface on the left heel which was initially pressure with [name] DTI. We've been using Hydrofera Blue. I'[name] going to switch this back to silver alginate Left first second toe/tinea pedis this looks better using silver alginate Right first second toe tinea pedis using silver alginate Large pressure ulcers on theLeft ischial tuberosity. Small wound here Looks better. I am uncertain about the surface over the large wound. Using silver alginate 04/11/18; Cellulitis in the right foot is resolved Left lateral calf wound which was his original wounds still has 2 tiny open areas remaining this is just about closed Nonviable surface on the left heel is better but still requires debridement Left first second toe/tinea pedis still open using silver alginate Right first second toe wound tinea pedis I asked him to go back to using ketoconazole and silver alginate Large pressure ulcers on the left ischial tuberosity this shear injury here is resolved. Wound is smaller. No evidence of infection using silver alginate 04/18/18; Patient arrives with an intense area of cellulitis in the right mid lower calf extending into the right heel area. Bright red and warm. Smaller area on the left anterior leg. He has [name] significant history of MRSA. He will definitely need antibioticsdoxycycline He now has 2 open areas on the left ischial tuberosity the original large wound and now [name] satellite area which I think was above his initial satellite areas. Not [name] wonderful surface on this satellite area surrounding  erythema which looks like pressure related. His left lateral calf wound again his original wound is just about closed Left heel pressure injury still requiring  debridement Left first second toe looks [name] lot better using silver alginate Right first second toe also using silver alginate and ketoconazole cream also looks better 04/20/18; the patient was worked in early today out of concerns with his cellulitis on the right leg. I had started him on doxycycline. This was 2 days ago. His wife was concerned about the swelling in the area. Also concerned about the left buttock. He has not been systemically unwell no fever chills. No nausea vomiting or diarrhea 04/25/18; the patient's left buttock wound is continued to deteriorate he is using Hydrofera Blue. He is still completing clindamycin for the cellulitis on the right leg although all of this looks better. 05/02/18 Left buttock wound still with [name] lot of drainage and [name] very tightly adherent fibrinous necrotic surface. He has [name] deeper area superiorly The left lateral calf wound is still closed DTI wound on the left heel necrotic surface especially the circumference using Iodoflex Areas between his left first second toe and right first second toe both look better. Dorsally and the right first second toe he had [name] necrotic surface although at smaller. In using silver alginate and ketoconazole. I did [name] culture last week which was [name] deep tissue culture of the reminiscence of the open wound on the right first second toe dorsally. This grew [name] few Acinetobacter and [name] few methicillin-resistant staph aureus. Nevertheless the area actually this week looked better. I didn't feel the need to specifically address this at least in terms of systemic antibiotics. 05/09/18; wounds are measuring larger more drainage per our intake. We are using Santyl covered with alginate on the large superficial buttock wounds, Iodosorb on the left heel, ketoconazole and silver alginate to the dorsal first and second toes bilaterally. 05/16/18; The area on his left buttock better in some aspects although the area superiorly over the ischial  tuberosity required an extensive debridement.using Santyl Left heel appears stable. Using Iodoflex The areas between his first and second toes are not bad however there is spreading erythema up the dorsal aspect of his left foot this looks like cellulitis again. He is insensate the erythema is really very brilliant.o Erysipelas He went to see an allergist days ago because he was itching part of this he had lab work done. This showed [name] white count of 15.1 with 70% neutrophils. Hemoglobin of 11.4 and [name] platelet count of 659,000. Last white count we had in Epic was [name] 2-1/2 years ago which was 25.9 but he was ill at the time. He was able to show me some lab work that was done by his primary physician the pattern is about the same. 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 But prompted me to go ahead and do x-rays of both feet and the pelvis rule out osteomyelitis. He also had [name] comprehensive metabolic panel this was reasonably normal his albumin was 3.7 liver function tests BUN/creatinine all normal 05/23/18; x-rays of both his feet from last week were negative for underlying pulmonary abnormality. The x-ray of his pelvis however showed mild irregularity in the left ischial which may represent some early osteomyelitis. The wound in the left ischial continues to get deeper clearly now exposed muscle. Each week necrotic surface material over this area. Whereas the rest of the wounds do not look so  bad. The left ischial wound we have been using Santyl and calcium alginate T the left heel surface necrotic debris using Iodoflex o The left lateral leg is still healed Areas on the left dorsal foot and the right dorsal foot are about the same. There is some inflammation on the left which might represent contact dermatitis, fungal dermatitis I am doubtful cellulitis although this looks better than last week 05/30/18; CT scan done at Hospital did not show any  osteomyelitis or abscess. Suggested the possibility of underlying cellulitis although I don't see [name] lot of evidence of this at the bedside The wound itself on the left buttock/upper thigh actually looks somewhat better. No debridement Left heel also looks better no debridement continue Iodoflex Both dorsal first second toe spaces appear better using Lotrisone. Left still required debridement 06/06/18; Intake reported some purulent looking drainage from the left gluteal wound. Using Santyl and calcium alginate Left heel looks better although still [name] nonviable surface requiring debridement The left dorsal foot first/second webspace actually expanding and somewhat deeper. I may consider doing [name] shave biopsy of this area Right dorsal foot first/second webspace appears stable to improved. Using Lotrisone and silver alginate to both these areas 06/13/18 Left gluteal surface looks better. Now separated in the 2 wounds. No debridement required. Still drainage. We'll continue silver alginate Left heel continues to look better with Iodoflex continue this for at least another week Of his dorsal foot wounds the area on the left still has some depth although it looks better than last week. We've been using Lotrisone and silver alginate 06/20/18 Left gluteal continues to look better healthy tissue Left heel continues to look better healthy granulation wound is smaller. He is using Iodoflex and his long as this continues continue the Iodoflex Dorsal right foot looks better unfortunately dorsal left foot does not. There is swelling and erythema of his forefoot. He had minor trauma to this several days ago but doesn't think this was enough to have caused any tissue injury. Foot looks like cellulitis, we have had this problem before 06/27/18 on evaluation today patient appears to be doing [name] little worse in regard to his foot ulcer. 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 Both of which would really be his only options for treating this infection. In the past he has been given and effusion of Orbactiv. This is done very well for him in the past again it's one time dosing IV antibiotic therapy. Subsequently I do believe this is something we're gonna need to see about doing at this point in time. Currently his other wounds seem to be doing somewhat better in my pinion I'[name] pretty happy in that regard. 07/03/18 on evaluation today patient's wounds actually appear to be doing fairly well. He has been tolerating the dressing changes without complication. All in all he seems to be showing signs of improvement. In regard to the antibiotics he has been dealing with infectious disease since I saw him last week as far as getting this scheduled. In the end [facility]'s [facility] this coming Friday. In the meantime he has been continuing to perform the dressing changes in such as previous. There does not appear to be any evidence of infection worsengin at this time. 07/10/18; Since I last saw this man 2  weeks ago things have actually improved. IV antibiotics of resulted in less forefoot erythema although there is still some present. He is not systemically unwell Left buttock wounds 2 now have no depth there is increased epithelialization Using silver alginate Left heel still requires debridement using Iodoflex Left dorsal foot still with [name] sizable wound about the size of [name] border but healthy granulation Right dorsal foot still with [name] slitlike area using silver alginate 07/18/18; the patient's cellulitis in the left foot is improved in fact I think it is on its way to resolving. Left buttock wounds 2 both look better although the larger one has hypertension granulation we've been using silver alginate Left heel has some thick  circumferential redundant skin over the wound edge which will need to be removed today we've been using Iodoflex Left dorsal foot is still [name] sizable wound required debridement using silver alginate The right dorsal foot is just about closed only [name] small open area remains here 07/25/18; left foot cellulitis is resolved Left buttock wounds 2 both look better. Hyper-granulation on the major area Left heel as some debris over the surface but otherwise looks [name] healthier wound. Using silver collagen Right dorsal foot is just about closed 07/31/18; arrives with our intake nurse worried about purulent drainage from the buttock. We had hyper-granulation here last week His buttock wounds 2 continue to look better Left heel some debris over the surface but measuring smaller. Right dorsal foot unfortunately has openings between the toes Left foot superficial wound looks less aggravated. 08/07/18 Buttock wounds continue to look better although some of her granulation and the larger medial wound. silver alginate Left heel continues to look [name] lot better.silver collagen Left foot superficial wound looks less stable. Requires debridement. He has [name] new wound superficial area on the foot on the lateral dorsal foot. Right foot looks better using silver alginate without Lotrisone 08/14/2018; patient was in the ER last week diagnosed with [name] UTI. He is now on Cefpodoxime and Macrodantin. Buttock wounds continued to be smaller. Using silver alginate Left heel continues to look better using silver collagen Left foot superficial wound looks as though it is improving Right dorsal foot area is just about healed. 08/21/2018; patient is completed his antibiotics for his UTI. He has 2 open areas on the buttocks. There is still not closed although the surface looks satisfactory. Using silver alginate Left heel continues to improve using silver collagen The bilateral dorsal foot areas which are at the base of his first and second  toes/possible tinea pedis are actually stable on the left but worse on the right. The area on the left required debridement of necrotic surface. After debridement I obtained [name] specimen for PCR culture. The right dorsal foot which is been just about healed last week is now reopened 08/28/2018; culture done on the left dorsal foot showed coag negative staph both staph epidermidis and Lugdunensis. I think this is worthwhile initiating systemic treatment. I will use doxycycline given his long list of allergies. The area on the left heel slightly improved but still requiring debridement. The large wound on the buttock is just about closed whereas the smaller one is larger. Using silver alginate in this area 09/04/2018; patient is completing his doxycycline for the left foot although this continues to be [name] very difficult wound area with very adherent necrotic debris. We are using silver alginate to all his wounds right foot left foot and the small wounds on his buttock, silver collagen on the left heel.  09/11/2018; once again this patient has intense erythema and swelling of the left forefoot. Lesser degrees of erythema in the right foot. He has [name] long list of allergies and intolerances. I will reinstitute doxycycline. 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 Using silver alginate Left heel also looks better using silver collagen Unfortunately both the areas on his feet look worse. The area on the left first second webspace is now gone through to the plantar part of his foot. The area on the left foot anteriorly is irritated with erythema and swelling in the forefoot. 09/25/2018 His wound on the left plantar heel looks better. Using silver collagen The area on the left buttock 2 small remnant areas. One is closed one is still open. Using silver alginate The areas between both his first and second toes look worse. This in spite of long-standing antifungal  therapy with ketoconazole and silver alginate which should have antifungal activity He has small areas around his original wound on the left calf one is on the bottom of the original scar tissue and one superiorly both of these are small and superficial but again given wound history in this site this is worrisome 10/02/2018 Left plantar heel continues to gradually contract using silver collagen Left buttock wound is unchanged using silver alginate The areas on his dorsal feet between his first and second toes bilaterally look about the same. I prescribed clindamycin ointment to see if we can address chronic staph colonization and also the underlying possibility of erythrasma The left lateral lower extremity wound is actually on the lateral part of his ankle. Small open area here. We have been using silver alginate 10/09/2018; Left plantar heel continues to look healthy and contract. No debridement is required Left buttock slightly smaller with [name] tape injury wound just below which was new this week Dorsal feet somewhat improved I have been using clindamycin Left lateral looks lower extremity the actual open area looks worse although [name] lot of this is epithelialized. I am going to change to silver collagen today He has [name] lot more swelling in the right leg although this is not pitting not red and not particularly warm there is [name] lot of spasm in the right leg usually indicative of people with paralysis of some underlying discomfort. We have reviewed his vascular status from 2017 he had [name] left greater saphenous vein ablation. I wonder about referring him back to vascular surgery if the area on the left leg continues to deteriorate. 10/16/2018 in today for follow-up and management of multiple lower extremity ulcers. His left Buttock wound is much lower smaller and almost closed completely. The wound to the left ankle has began to reopen with Epithelialization and some adherent slough. He has multiple  new areas to the left foot and leg. The left dorsal foot without much improvement. Wound present between left great webspace and 2nd toe. Erythema and edema present right leg. Right LE ultrasound obtained on 10/10/18 was negative for DVT . 10/23/2018; Left buttock is closed over. Still dry macerated skin but there is no open wound. I suspect this is chronic pressure/moisture Left lateral calf is quite [name] bit worse than when I saw this last. There is clearly drainage here he has macerated skin into the left plantar heel. We will change the primary dressing to alginate Left dorsal foot has some improvement in overall wound area. Still using clindamycin and silver alginate Right dorsal foot about the  same as the left using clindamycin and silver alginate The erythema in the right leg has resolved. He is DVT rule out was negative Left heel pressure area required debridement although the wound is smaller and the surface is health 10/26/2018 The patient came back in for his nurse check today predominantly because of the drainage coming out of the left lateral leg with [name] recent reopening of his original wound on the left lateral calf. He comes in today with [name] large amount of surrounding erythema around the wound extending from the calf into the ankle and even in the area on the dorsal foot. He is not systemically unwell. He is not febrile. Nevertheless this looks like cellulitis. We have been using silver alginate to the area. I changed him to [name] regular visit and I am going to prescribe him doxycycline. The rationale here is [name] long list of medication intolerances and [name] history of MRSA. I did not see anything that I thought would provide [name] valuable culture 10/30/2018 Follow-up from his appointment 4 days ago with really an extensive area of cellulitis in the left calf left lateral ankle and left dorsal foot. I put him on doxycycline. He has [name] long list of medication allergies which are true allergy reactions.  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 In any case he is [name] lot better today. The erythema especially in the anterior and lateral left calf is better. He still has left ankle erythema. He also is complaining about increasing edema in the right leg we have only been using Kerlix Coban and he has been doing the wraps at home. Finally he has [name] spotty rash on the medial part of his upper left calf which looks like folliculitis or perhaps wrap occlusion type injury. Small superficial macules not pustules 11/06/18 patient arrives today with again [name] considerable degree of erythema around the wound on the left lateral calf extending into the dorsal ankle and dorsal foot. This is [name] lot worse than when I saw this last week. He is on doxycycline really with not [name] lot of improvement. He has not been systemically unwell Wounds on the; left heel actually looks improved. Original area on the left foot and proximity to the first and second toes looks about the same. He has superficial areas on the dorsal foot, anterior calf and then the reopening of his original wound on the left lateral calf which looks about the same The only area he has on the right is the dorsal webspace first and second which is smaller. He has [name] large area of dry erythematous skin on the left buttock small open area here. 11/13/2018; the patient arrives in much better condition. The erythema around the wound on the left lateral calf is [name] lot better. Not sure whether this was the clindamycin or the TCA and ketoconazole or just in the improvement in edema control [stasis dermatitis]. In any case this is [name] lot better. The area on the left heel is very small and just about resolved using silver collagen we have been using silver alginate to the areas on his dorsal feet 11/20/2018; his wounds include the left lateral calf, left heel, dorsal aspects of both feet just proximal to  the first second webspace. He is stable to slightly improved. I did not think any changes to his dressings were going to be necessary 11/27/2018 he has [name] reopening on the left buttock which is surrounded  by what looks like tinea or perhaps some other form of dermatitis. The area on the left dorsal foot has some erythema around it I have marked this area but I am not sure whether this is cellulitis or not. Left heel is not closed. Left calf the reopening is really slightly longer and probably worse 1/13; in general things look better and smaller except for the left dorsal foot. Area on the left heel is just about closed, left buttock looks better only [name] small wound remains in the skin looks better [using Lotrisone] 1/20; the area on the left heel only has [name] few remaining open areas here. Left lateral calf about the same in terms of size, left dorsal foot slightly larger right lateral foot still not closed. The area on the left buttock has no open wound and the surrounding skin looks [name] lot better 1/27; the area on the left heel is closed. Left lateral calf better but still requiring extensive debridements. The area on his left buttock is closed. He still has the open areas on the left dorsal foot which is slightly smaller in the right foot which is slightly expanded. We have been using Iodoflex on these areas as well 2/3; left heel is closed. Left lateral calf still requiring debridement using Iodoflex there is no open area on his left buttock however he has dry scaly skin over [name] large area of this. Not really responding well to the Lotrisone. Finally the areas on his dorsal feet at the level of the first second webspace are slightly smaller on the right and about the same on the left. Both of these vigorously debrided with Anasept and gauze 2/10; left heel remains closed he has dry erythematous skin over the left buttock but there is no open wound here. Left lateral leg has come in and with.  Still requiring debridement we have been using Iodoflex here. Finally the area on the left dorsal foot and right dorsal foot are really about the same extremely dry callused fissured areas. He does not yet have [name] dermatology appointment 2/17; left heel remains closed. He has [name] new open area on the left buttock. The area on the left lateral calf is bigger longer and still covered in necrotic debris. No major change in his foot areas bilaterally. I am awaiting for [name] dermatologist to look on this. We have been using ketoconazole I do not know that this is been doing any good at all. 2/24; left heel remains closed. The left buttock wound that was new reopening last week looks better. The left lateral calf appears better also although still requires debridement. The major area on his foot is the left first second also requiring debridement. We have been putting Prisma on all wounds. I do not believe that the ketoconazole has done too much good for his feet. He will use Lotrisone I am going to give him [name] 2-week course of terbinafine. We still do not have [name] dermatology appointment 3/2 left heel remains closed however there is skin over bone in this area I pointed this out to him today. The left buttock wound is epithelialized but still does not look completely stable. The area on the left leg required debridement were using silver collagen here. With regards to his feet we changed to Lotrisone last week and silver alginate. 3/9; left heel remains closed. Left buttock remains closed. The area on the right foot is essentially closed. The left foot remains unchanged. Slightly smaller on the left lateral  calf. Using silver collagen to both of these areas 3/16-Left heel remains closed. Area on right foot is closed. Left lateral calf above the lateral malleolus open wound requiring debridement with easy bleeding. Left dorsal wound proximal to first toe also debrided. Left ischial area open new. Patient has been  using Prisma with wrapping every 3 days. Dermatology appointment is apparently tomorrow.Patient has completed his terbinafine 2-week course with some apparent improvement according to him, there is still flaking and dry skin in his foot on the left 3/23; area on the right foot is reopened. The area on the left anterior foot is about the same still [name] very necrotic adherent surface. He still has the area on the left leg and reopening is on the left buttock. He apparently saw dermatology although I do not have [name] note. According to the patient who is usually fairly well informed they did not have any good ideas. Put him on oral terbinafine which she is been on before. 3/30; using silver collagen to all wounds. Apparently his dermatologist put him on doxycycline and rifampin presumably some culture grew staph. I do not have this result. He remains on terbinafine although I have used terbinafine on him before 4/6; patient has had [name] fairly substantial reopening on the right foot between the first and second toes. He is finished his terbinafine and I believe is on doxycycline and rifampin still as prescribed by dermatology. We have been using silver collagen to all his wounds although the patient reports that he thinks silver alginate does better on the wounds on his buttock. 4/13; the area on his left lateral calf about the same size but it did not require debridement. Left dorsal foot just proximal to the webspace between the first and second toes is about the same. Still nonviable surface. I note some superficial bronze discoloration of the dorsal part of his foot Right dorsal foot just proximal to the first and second toes also looks about the same. I still think there may be the same discoloration I noted above on the left Left buttock wound looks about the same 4/20; left lateral calf appears to be gradually contracting using silver collagen. He remains on erythromycin empiric treatment for possible  erythrasma involving his digital spaces. The left dorsal foot wound is debrided of tightly adherent necrotic debris and really cleans up quite nicely. The right area is worse with expansion. I did not debride this it is now over the base of the second toe The area on his left buttock is smaller no debridement is required using silver collagen 5/4; left calf continues to make good progress. He arrives with erythema around the wounds on his dorsal foot which even extends to the plantar aspect. Very concerning for coexistent infection. He is finished the erythromycin I gave him for possible erythrasma this does not seem to have helped. The area on the left foot is about the same base of the dorsal toes Is area on the buttock looks improved on the left 5/11; left calf and left buttock continued to make good progress. Left foot is about the same to slightly improved. Major problem is on the right foot. He has not had an x-ray. Deep tissue culture I did last week showed both Enterobacter and E. coli. I did not change the doxycycline I put him on empirically although neither 1 of these were plated to doxycycline. He arrives today with the erythema looking worse on both the dorsal and plantar foot. Macerated skin on  the bottom of the foot. he has not been systemically unwell 5/18-Patient returns at 1 week, left calf wound appears to be making some progress, left buttock wound appears slightly worse than last time, left foot wound looks slightly better, right foot redness is marginally better. X-ray of both feet show no air or evidence of osteomyelitis. Patient is finished his Omnicef and terbinafine. He continues to have macerated skin on the bottom of the left foot as well as right 5/26; left calf wound is better, left buttock wound appears to have multiple small superficial open areas with surrounding macerated skin. X-rays that I did last time showed no evidence of osteomyelitis in either foot. He is  finished cefdinir and doxycycline. I do not think that he was on terbinafine. He continues to have [name] large superficial open area on the right foot anterior dorsal and slightly between the first and second toes. I did send him to dermatology 2 months ago or so wondering about whether they would do [name] fungal scraping. I do not believe they did but did do [name] culture. We have been using silver alginate to the toe areas, he has been using antifungals at home topically either ketoconazole or Lotrisone. We are using silver collagen on the left foot, silver alginate on the right, silver collagen on the left lateral leg and silver alginate on the left buttock 6/1; left buttock area is healed. We have the left dorsal foot, left lateral leg and right dorsal foot. We are using silver alginate to the areas on both feet and silver collagen to the area on his left lateral calf 6/8; the left buttock apparently reopened late last week. He is not really sure how this happened. He is tolerating the terbinafine. Using silver alginate to all wounds 6/15; left buttock wound is larger than last week but still superficial. Came in the clinic today with [name] report of purulence from the left lateral leg I did not identify any infection Both areas on his dorsal feet appear to be better. He is tolerating the terbinafine. Using silver alginate to all wounds 6/22; left buttock is about the same this week, left calf quite [name] bit better. His left foot is about the same however he comes in with erythema and warmth in the right forefoot once again. Culture that I gave him in the beginning of May showed Enterobacter and E. coli. I gave him doxycycline and things seem to improve although neither 1 of these organisms was specifically plated. 6/29; left buttock is larger and dry this week. Left lateral calf looks to me to be improved. Left dorsal foot also somewhat improved right foot completely unchanged. The erythema on the right foot is  still present. He is completing the Ceftin dinner that I gave him empirically [see discussion above.) 7/6 - All wounds look to be stable and perhaps improved, the left buttock wound is slightly smaller, per patient bleeds easily, completed ceftin, the right foot redness is less, he is on terbinafine 7/13; left buttock wound about the same perhaps slightly narrower. Area on the left lateral leg continues to narrow. Left dorsal foot slightly smaller right foot about the same. We are using silver alginate on the right foot and Hydrofera Blue to the areas on the left. Unna boot on the left 2 layer compression on the right 7/20; left buttock wound absolutely the same. Area on lateral leg continues to get better. Left dorsal foot require debridement as did the right no major change in  the 7/27; left buttock wound the same size necrotic debris over the surface. The area on the lateral leg is closed once again. His left foot looks better right foot about the same although there is some involvement now of the posterior first second toe area. He is still on terbinafine which I have given him for [name] month, not certain [name] centimeter major change 06/25/19-All wounds appear to be slightly improved according to report, left buttock wound looks clean, both foot wounds have minimal to no debris the right dorsal foot has minimal slough. We are using Hydrofera Blue to the left and silver alginate to the right foot and ischial wound. 8/10-Wounds all appear to be around the same, the right forefoot distal part has some redness which was not there before, however the wound looks clean and small. Ischial wound looks about the same with no changes 8/17; his wound on the left lateral calf which was his original chronic venous insufficiency wound remains closed. Since I last saw him the areas on the left dorsal foot right dorsal foot generally appear better but require debridement. The area on his left initial tuberosity appears  somewhat larger to me perhaps hyper granulated and bleeds very easily. We have been using Hydrofera Blue to the left dorsal foot and silver alginate to everything else 8/24; left lateral calf remains closed. The areas on his dorsal feet on the webspace of the first and second toes bilaterally both look better. The area on the left buttock which is the pressure ulcer stage II slightly smaller. I change the dressing to Hydrofera Blue to all areas 8/31; left lateral calf remains closed. The area on his dorsal feet bilaterally look better. Using Hydrofera Blue. Still requiring debridement on the left foot. No change in the left buttock pressure ulcers however 9/14; left lateral calf remains closed. Dorsal feet look quite [name] bit better than 2 weeks ago. Flaking dry skin also [name] lot better with the ammonium lactate I gave him 2 weeks ago. The area on the left buttock is improved. He states that his Roho cushion developed [name] leak and he is getting [name] new one, in the interim he is offloading this vigorously 9/21; left calf remains closed. Left heel which was [name] possible DTI looks better this week. He had macerated tissue around the left dorsal foot right foot looks satisfactory and improved left buttock wound. I changed his dressings to his feet to silver alginate bilaterally. Continuing Hydrofera Blue on the left buttock. 9/28 left calf remains closed. Left heel did not develop anything [possible DTI] dry flaking skin on the left dorsal foot. Right foot looks satisfactory. Improved left buttock wound. We are using silver alginate on his feet Hydrofera Blue on the buttock. I have asked him to go back to the Lotrisone on his feet including the wounds and surrounding areas 10/5; left calf remains closed. The areas on the left and right feet about the same. [name] lot of this is epithelialized however debris over the remaining open areas. He is using Lotrisone and silver alginate. The area on the left buttock using  Hydrofera Blue 10/26. Patient has been out for 3 weeks secondary to Covid concerns. He tested negative but I think his wife tested positive. He comes in today with the left foot substantially worse, right foot about the same. Even more concerning he states that the area on his left buttock closed over but then reopened and is considerably deeper in one aspect than it was before [  stage III wound] 11/2; left foot really about the same as last week. Quarter sized wound on the dorsal foot just proximal to the first second toes. Surrounding erythema with areas of denuded epithelium. This is not really much different looking. Did not look like cellulitis this time however. Right foot area about the same.. We have been using silver alginate alginate on his toes Left buttock still substantial irritated skin around the wound which I think looks somewhat better. We have been using Hydrofera Blue here. 11/9; left foot larger than last week and [name] very necrotic surface. Right foot I think is about the same perhaps slightly smaller. Debris around the circumference also addressed. Unfortunately on the left buttock there is been [name] decline. Satellite lesions below the major wound distally and now [name] an additional one posteriorly we have been using Hydrofera Blue but I think this is [name] pressure issue 11/16; left foot ulcer dorsally again [name] very adherent necrotic surface. Right foot is about the same. Not much change in the pressure ulcer on his left buttock. 11/30; left foot ulcer dorsally basically the same as when I saw him 2 weeks ago. Very adherent fibrinous debris on the wound surface. Patient reports [name] lot of drainage as well. 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 We have been using Iodoflex, patient changed back to alginate because of the drainage. Area on his right dorsal foot really looks benign with [name] healthier surface certainly [name] lot better than on the left.  Left buttock wounds all improved using Hydrofera Blue 12/7; left dorsal foot again no improvement. Tightly adherent debris. PCR culture I did last week only showed likely skin contaminant. I have gone ahead and done [name] punch biopsy of this which is about the last thing in terms of investigations I can think to do. He has known venous insufficiency and venous hypertension and this could be the issue here. The area on the right foot is about the same left buttock slightly worse according to our intake nurse secondary to [facility] Blue sticking to the wound 12/14; biopsy of the left foot that I did last time showed changes that could be related to wound healing/chronic stasis dermatitis phenomenon no neoplasm. We have been using silver alginate to both feet. I change the one on the left today to Sorbact and silver alginate to his other 2 wounds 12/28; the patient arrives with the following problems; Major issue is the dorsal left foot which continues to be [name] larger deeper wound area. Still with [name] completely nonviable surface Paradoxically the area mirror image on the right on the right dorsal foot appears to be getting better. He had some loss of dry denuded skin from the lower part of his original wound on the left lateral calf. Some of this area looked [name] little vulnerable and for this reason we put him in wrap that on this side this week The area on his left buttock is larger. He still has the erythematous circular area which I think is [name] combination of pressure, sweat. This does not look like cellulitis or fungal dermatitis 11/26/2019; -Dorsal left foot large open wound with depth. Still debris over the surface. Using Sorbact The area on the dorsal right foot paradoxically has closed over He has [name] reopening on the left ankle laterally at the base of his original wound that extended up into the calf. This appears clean. The left buttock wound is smaller but with very adherent  necrotic debris over the  surface. We have been using silver alginate here as well The patient had arterial studies done in 2017. He had biphasic waveforms at the dorsalis pedis and posterior tibial bilaterally. ABI in the left was 1.17. Digit waveforms were dampened. He has slight spasticity in the great toes I do not think [name] TBI would be possible 1/11; the patient comes in today with [name] sizable reopening between the first and second toes on the right. This is not exactly in the same location where we have been treating wounds previously. According to our intake nurse this was actually fairly deep but 0.6 cm. The area on the left dorsal foot looks about the same the surface is somewhat cleaner using Sorbact, his MRI is in 2 days. We have not managed yet to get arterial studies. The new reopening on the left lateral calf looks somewhat better using alginate. The left buttock wound is about the same using alginate 1/18; the patient had his ARTERIAL studies which were quite normal. ABI in the right at 1.13 with triphasic/biphasic waveforms on the left ABI 1.06 again with triphasic/biphasic waveforms. It would not have been possible to have done [name] toe brachial index because of spasticity. We have been using Sorbac to the left foot alginate to the rest of his wounds on the right foot left lateral calf and left buttock 1/25; arrives in clinic with erythema and swelling of the left forefoot worse over the first MTP area. This extends laterally dorsally and but also posteriorly. Still has an area on the left lateral part of the lower part of his calf wound it is eschared and clearly not closed. Area on the left buttock still with surrounding irritation and erythema. Right foot surface wound dorsally. The area between the right and first and second toes appears better. 2/1; The left foot wound is about the same. Erythema slightly better I gave him [name] week of doxycycline empirically Right foot wound is more extensive extending between  the toes to the plantar surface Left lateral calf really no open surface on the inferior part of his original wound however the entire area still looks vulnerable Absolutely no improvement in the left buttock wound required debridement. 2/8; the left foot is about the same. Erythema is slightly improved I gave him clindamycin last week. Right foot looks better he is using Lotrimin and silver alginate He has [name] breakdown in the left lateral calf. Denuded epithelium which I have removed Left buttock about the same were using Hydrofera Blue 2/15; left foot is about the same there is less surrounding erythema. Surface still has tightly adherent debris which I have debriding however not making any progress Right foot has [name] substantial wound on the medial right second toe between the first and second webspace. Still an open area on the left lateral calf distal area. Buttock wound is about the same 2/22; left foot is about the same less surrounding erythema. Surface has adherent debris. Polymen Ag Right foot area significant wound between the first and second toes. We have been using silver alginate here Left lateral leg polymen Ag at the base of his original venous insufficiency wound Left buttock some improvement here 3/1; Right foot is deteriorating in the first second toe webspace. Larger and more substantial. We have been using silver alginate. Left dorsal foot about the same markedly adherent surface debris using PolyMem Ag Left lateral calf surface debris using PolyMem AG Left buttock is improved again using PolyMem Ag. He  is completing his terbinafine. The erythema in the foot seems better. He has been on this for 2 weeks 3/8; no improvement in any wound area in fact he has [name] small open area on the dorsal midfoot which is new this week. He has not gotten his foot x-rays yet 3/15; his x-rays were both negative for osteomyelitis of both feet. No major change in any of his wounds on the  extremities however his buttock wounds are better. We have been using polymen on the buttocks, left lower leg. Iodoflex on the left foot and silver alginate on the right 3/22; arrives in clinic today with the 2 major issues are the improvement in the left dorsal foot wound which for once actually looks healthy with [name] nice healthy wound surface without debridement. Using Iodoflex here. Unfortunately on the left lateral calf which is in the distal part of his original wound he came to the clinic here for there was purulent drainage noted some increased breakdown scattered around the original area and [name] small area proximally. We we are using polymen here will change to silver alginate today. His buttock wound on the left is better and I think the area on the right first second toe webspace is also improved 3/29; left dorsal foot looks better. Using Iodoflex. Left ankle culture from deterioration last time grew E. coli, Enterobacter and Enterococcus. I will give him [name] course of cefdinir although that will not cover Enterococcus. The area on the right foot in the webspace of the first and second toe lateral first toe looks better. The area on his buttock is about healed Vascular appointment is on April 21. This is to look at his venous system vis--vis continued breakdown of the wounds on the left including the left lateral leg and left dorsal foot he. He has had previous ablations on this side 4/5; the area between the right first and second toes lateral aspect of the first toe looks better. Dorsal aspect of the left first toe on the left foot also improved. Unfortunately the left lateral lower leg is larger and there is [name] second satellite wound superiorly. The usual superficial abrasions on the left buttock overall better but certainly not closed 4/12; the area between the right first and second toes is improved. Dorsal aspect of the left foot also slightly smaller with [name] vibrant healthy looking  surface. No real change in the left lateral leg and the left buttock wound is healed He has an unaffordable co-pay for Apligraf. Appointment with vein and vascular with regards to the left leg venous part of the circulation is on 4/21 4/19; we continue to see improvement in all wound areas. Although this is minor. He has his vascular appointment on 4/21. The area on the left buttock has not reopened although right in the center of this area the skin looks somewhat threatened 4/26; the left buttock is unfortunately reopened. In general his left dorsal foot has [name] healthy surface and looks somewhat smaller although it was not measured as such. The area between his first and second toe webspace on the right as [name] small wound against the first toe. The patient saw vascular surgery. The real question I was asking was about the small saphenous vein on the left. He has previously ablated left greater saphenous vein. Nothing further was commented on on the left. Right greater saphenous vein without reflux at the saphenofemoral junction or proximal thigh there was no indication for ablation of the right greater saphenous vein  duplex was negative for DVT bilaterally. They did not think there was anything from [name] point of view that could be offered. They ABIs within normal limits 5/3; only small open area on the left buttock. The area on the left lateral leg which was his original venous reflux is now 2 wounds both which look clean. We are using Iodoflex on the left dorsal foot which looks healthy and smaller. He is down to [name] very tiny area between the right first and second toes, using silver alginate 5/10; all of his wounds appear better. We have much better edema control in 4 layer compression on the left. This may be the factor that is allowing the left foot and left lateral calf to heal. He has external compression garments at home 04/14/20-All of his wounds are progressing well, the left  forefoot is practically closed, left ischium appears to be about the same, right toe webspace is also smaller. The left lateral leg is about the same, continue using Hydrofera Blue to this, silver alginate to the ischium, Iodoflex to the toe space on the right 6/7; most of his wounds outside of the left buttock are doing well. The area on the left lateral calf and left dorsal foot are smaller. The area on the right foot in between the first and second toe webspace is barely visible although he still says there is some drainage here is the only reason I did not heal this out. Unfortunately the area on the left buttock almost looks like he has [name] skin tear from tape. He has open wound and then [name] large flap of skin that we are trying to get adherence over an area just next to the remaining wound 6/21; 2 week follow-up. I believe is been here for nurse visits. Miraculously the area between his first and second toes on the left dorsal foot is closed over. Still open on the right first second web space. The left lateral calf has 2 open areas. Distally this is more superficial. The proximal area had [name] little more depth and required debridement of adherent necrotic material. His buttock wound is actually larger we have been using silver alginate here 6/28; the patient's area on the left foot remains closed. Still open wet area between the first and second toes on the right and also extending into the plantar aspect. We have been using silver alginate in this location. He has 2 areas on the left lower leg part of his original long wounds which I think are better. We have been using Hydrofera Blue here. Hydrofera Blue to the left buttock which is stable 7/12; left foot remains closed. Left ankle is closed. May be [name] small area between his right first and second toes the only truly open area is on the left buttock. We have been using Hydrofera Blue here 7/19; patient arrives with marked deterioration especially in  the left foot and ankle. We did not put him in [name] compression wrap on the left last week in fact he wore his juxta lite stockings on either side although he does not have an underlying stocking. He has [name] reopening on the left dorsal foot, left lateral ankle and [name] new area on the right dorsal ankle. More worrisome is the degree of erythema on the left foot extending on the lateral foot into the lateral lower leg on the left 7/26; the patient had erythema and drainage from the lateral left ankle last week. Culture of this grew MRSA resistant  to doxycycline and clindamycin which are the 2 antibiotics we usually use with this patient who has multiple antibiotic allergies including linezolid, trimethoprim sulfamethoxazole. I had give him an empiric doxycycline and he comes in the area certainly looks somewhat better although it is blotchy in his lower leg. He has not been systemically unwell. He has had areas on the left dorsal foot which is [name] reopening, chronic wounds on the left lateral ankle. Both of these I think are secondary to chronic venous insufficiency. The area between his first and second toes is closed as far as I can tell. He had [name] new wrap injury on the right dorsal ankle last week. Finally he has an area on the left buttock. We have been using silver alginate to everything except the left buttock we are using Hydrofera Blue 06/30/20-Patient returns at 1 week, has been given [name] sample dose pack of NUZYRA which is [name] tetracycline derivative [omadacycline], patient has completed those, we have been using silver alginate to almost all the wounds except the left ischium where we are using Hydrofera Blue all of them look better 8/16; since I last saw the patient he has been doing well. The area on the left buttock, left lateral ankle and left foot are all closed today. He has completed the [place] I gave him last time and tolerated this well. He still has open areas on the right dorsal ankle and in the  right first second toe area which we are using silver alginate. 8/23; we put him in his bilateral external compression stockings last week as he did not have anything open on either leg except for concerning area between the right first and second toe. He comes in today with an area on the left dorsal foot slightly more proximal than the original wound, the left lateral foot but this is actually [name] continuation of the area he had on the left lateral ankle from last time. As well he is opened up on the left buttock again. 8/30; comes in today with things looking [name] lot better. The area on the left lower ankle has closed down as has the left foot but with eschar in both areas. The area on the dorsal right ankle is also epithelialized. Very little remaining of the left buttock wound. We have been using silver alginate on all wound areas 9/13; the area in the first second toe webspace on the right has fully epithelialized. He still has some vulnerable epithelium on the right and the ankle and the dorsal foot. He notes weeping. He is using his juxta lite stocking. On the left again the left dorsal foot is closed left lateral ankle is closed. We went to the juxta lite stocking here as well. Still vulnerable in the left buttock although only 2 small open areas remain here 9/27; 2-week follow-up. We did not look at his left leg but the patient says everything is closed. He is [name] bit disturbed by the amount of edema in his left foot he is using juxta lite stockings but asking about over the toes stockings which would be 30/40, will talk to him next time. According to him there is no open wound on either the left foot or the left ankle/calf He has an open area on the dorsal right calf which I initially point [name] wrap injury. He has superficial remaining wound on the left ischial tuberosity been using silver alginate although he says this sticks to the wound 10/5; we gave him 2-week follow-up  but he called yesterday  expressing some concerns about his right foot right ankle and the left buttock. He came in early. There is still no open areas on the left leg and that still in his juxta lite stocking 10/11; he only has 1 small area on the left buttock that remains measuring millimeters 1 mm. Still has the same irritated skin in this area. We recommended zinc oxide when this eventually closes and pressure relief is meticulously is he can do this. He still has an area on the dorsal part of his right first through third toes which is [name] bit irritated and still open and on the dorsal ankle near the crease of the ankle. We have been using silver alginate and using his own stocking. He has nothing open on the left leg or foot 10/25; 2-week follow-up. Not nearly as good on the left buttock as I was hoping. For open areas with 5 looking threatened small. He has the erythematous irritated chronic skin in this area. 1 area on the right dorsal ankle. He reports this area bleeds easily Right dorsal foot just proximal to the base of his toes We have been using silver alginate. 11/8; 2-week follow-up. Left buttock is about the same although I do not think the wounds are in the same location we have been using silver alginate. I have asked him to use zinc oxide on the skin around the wounds. He still has [name] small area on the right dorsal ankle he reports this bleeds easily Right dorsal foot just proximal to the base of the toes does not have anything open although the skin is very dry and scaly He has [name] new opening on the nailbed of the left great toe. Nothing on the left ankle 11/29; 3-week follow-up. Left buttock has 2 open areas. And washing of these wounds today started bleeding easily. Suggesting very friable tissue. We have been using silver alginate. Right dorsal ankle which I thought was initially [name] wrap injury we have been using silver alginate. Nothing open between the toes that I can see. He states the area on the left  dorsal toe nailbed healed after the last visit in 2 or 3 days 12/13; 3-week follow-up. His left buttock now has 3 open areas but the original 2 areas are smaller using polymen here. Surrounding skin looks better. The right dorsal ankle is closed. He has [name] small opening on the right dorsal foot at the level of the third toe. In general the skin looks better here. He is wearing his juxta lite stocking on the left leg says there is nothing open 11/24/2020; 3 weeks follow-up. His left buttock still has the 3 open areas. We have been using polymen but due to lack of response he changed to [facility] area. Surrounding skin is dry erythematous and irritated looking. There is no evidence of infection either bacterial or fungal however there is loss of surface epithelium He still has very dry skin in his foot causing irritation and erythema on the dorsal part of his toes. This is not responded to prolonged courses of antifungal simply looks dry and irritated 1/24; left buttock area still looks about the same he was unable to find the triad ointment that we had suggested. The area on the right lower leg just above the dorsal ankle has reopened and the areas on the right foot between the first second and second third toes and scaling on the bottom of the foot has been about the same  for quite some time now. been using silver alginate to all wound areas 2/7; left buttock wound looked quite good although not much smaller in terms of surface area surrounding skin looks better. Only [name] few dry flaking areas on the right foot in between the first and second toes the skin generally looks better here [ammonium lactate]. Finally the area on the right dorsal ankle is closed 2/21;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 stockings. His left anterior lateral leg wound continues to have [name] healthy-looking surface. I'[name] not sure where we were with the Apligraf run through his insurance. We have been using Iodoflex. He has [name] thick eschar on the left first second toe interface, I suspect this may be fungal however there is no visible open 12/23/16; no open wound on his right leg. He has 2 small areas left of the linear wound that was remaining last week. We have been using Prisma, I thought I have disclosed this week, we can only look forward to next week 01/03/17; the patient had concerning areas of erythema last week, already on doxycycline for UTI through his primary doctor. The erythema is absolutely no better there is warmth and swelling both medially from the left lateral leg wound and also the dorsal left foot. 01/06/17- Patient is here for follow-up evaluation of his left lateral leg ulcer and bilateral feet ulcers. He is on oral antibiotic therapy, tolerating that. Nursing staff and the patient states that the erythema is improved from Monday. 01/13/17; the predominant left lateral leg wound continues to be problematic. I had put Apligraf on him earlier this month once. However he subsequently developed what appeared to be an intense cellulitis around the left lateral leg wound. I gave him Dalvance I think on 2/12 perhaps 2/13 he continues on cefdinir. The erythema is still present but the warmth and swelling is improved. I am hopeful that the cellulitis part of this control. I wouldn't be surprised if  there is an element of venous inflammation as well. 01/17/17. The erythema is present but better in the left leg. His  left lateral leg wound still does not have [name] viable surface buttons certain parts of this long thin wound it appears like there has been improvement in dimensions. 01/20/17; the erythema still present but much better in the left leg. I'[name] thinking this is his usual degree of chronic venous inflammation. The wound on the left leg looks somewhat better. Is less surface slough 01/27/17; erythema is back to the chronic venous inflammation. The wound on the left leg is somewhat better. I am back to the point where I like to try an Apligraf once again 02/10/17; slight improvement in wound dimensions. Apligraf #2. He is completing his doxycycline 02/14/17; patient arrives today having completed doxycycline last Thursday. This was supposed to be [name] nurse visit however once again he hasn't tense erythema from the medial part of his wound extending over the lower leg. Also erythema in his foot this is roughly in the same distribution as last time. He has baseline chronic venous inflammation however this is [name] lot worse than the baseline I have learned to accept the on him is baseline inflammation 02/24/17- patient is here for follow-up evaluation. He is tolerating compression therapy. His voicing no complaints or concerns he is here anticipating an Apligraf 03/03/17; he arrives today with an adherent necrotic surface. I don't think this is surface is going to be amenable for Apligraf's. The erythema around his wound and on the left dorsal foot has resolved he is off antibiotics 03/10/17; better-looking surface today. I don't think he can tolerate Apligraf's. He tells me he had [name] wound VAC after [name] skin graft years ago to this area and they had difficulty with [name] seal. The erythema continues to be stable around this some degree of chronic venous inflammation but he also has recurrent cellulitis. We have been using Iodoflex 03/17/17; continued improvement in the surface and may be small changes in dimensions. Using Iodoflex  which seems the only thing that will control his surface 03/24/17- He is here for follow up evaluation of his LLE lateral ulceration and ulcer to right dorsal foot/toe space. He is voicing no complaints or concerns, He is tolerating compression wrap. 03/31/17 arrives today with [name] much healthier looking wound on the left lower extremity. We have been using Iodoflex for [name] prolonged period of time which has for the first time prepared and adequate looking wound bed although we have not had much in the way of wound dimension improvement. He also has [name] small wound between the first and second toe on the right 04/07/17; arrives today with [name] healthy-looking wound bed and at least the top 50% of this wound appears to be now her. No debridement was required I have changed him to [facility] last week after prolonged Iodoflex. He did not do well with Apligraf's. We've had [name] re-opening between the first and second toe on the right 04/14/17; arrives today with [name] healthier looking wound bed contractions and the top 50% of this wound and some on the lesser 50%. Wound bed appears healthy. The area between the first and second toe on the right still remains problematic 04/21/17; continued very gradual improvement. Using [facility] 04/28/17; continued very gradual improvement in the left lateral leg venous insufficiency wound. His periwound erythema is very mild. We have been using Hydrofera Blue. Wound is making progress especially in the superior 50% 05/05/17; he continues to  have very gradual improvement in the left lateral venous insufficiency wound. Both in terms with an length rings are improving. I debrided this every 2 weeks with #5 curet and we have been using Hydrofera Blue and again making good progress With regards to the wounds between his right first and second toe which I thought might of been tinea pedis he is not making as much progress very dry scaly skin over the area. Also the area at the base of  the left first and second toe in [name] similar condition 05/12/17; continued gradual improvement in the refractory left lateral venous insufficiency wound on the left. Dimension smaller. Surface still requiring debridement using Hydrofera Blue 05/19/17; continued gradual improvement in the refractory left lateral venous ulceration. Careful inspection of the wound bed underlying rumination suggested some degree of epithelialization over the surface no debridement indicated. Continue Hydrofera Blue difficult areas between his toes first and third on the left than first and second on the right. I'[name] going to change to silver alginate from silver collagen. Continue ketoconazole as I suspect underlying tinea pedis 05/26/17; left lateral leg venous insufficiency wound. We've been using Hydrofera Blue. I believe that there is expanding epithelialization over the surface of the wound albeit not coming from the wound circumference. This is [name] bit of an odd situation in which the epithelialization seems to be coming from the surface of the wound rather than in the exact circumference. There is still small open areas mostly along the lateral margin of the wound. ooHe has unchanged areas between the left first and second and the right first second toes which I been treating for tenia pedis 06/02/17; left lateral leg venous insufficiency wound. We have been using Hydrofera Blue. Somewhat smaller from the wound circumference. The surface of the wound remains [name] bit on it almost epithelialized sedation in appearance. I use an open curette today debridement in the surface of all of this especially the edges ooSmall open wounds remaining on the dorsal right first and second toe interspace and the plantar left first second toe and her face on the left 06/09/17; wound on the left lateral leg continues to be smaller but very gradual and very dry surface using Hydrofera Blue 06/16/17 requires weekly debridements now on the left  lateral leg although this continues to contract. I changed to silver collagen last week because of dryness of the wound bed. Using Iodoflex to the areas on his first and second toes/web space bilaterally 06/24/17; patient with history of paraplegia also chronic venous insufficiency with lymphedema. Has [name] very difficult wound on the left lateral leg. This has been gradually reducing in terms of with but comes in with [name] very dry adherent surface. High switch to silver collagen [name] week or so ago with hydrogel to keep the area moist. This is been refractory to multiple dressing attempts. He also has areas in his first and second toes bilaterally in the anterior and posterior web space. I had been using Iodoflex here after [name] prolonged course of silver alginate with ketoconazole was ineffective [question tinea pedis] 07/14/17; patient arrives today with [name] very difficult adherent material over his left lateral lower leg wound. He also has surrounding erythema and poorly controlled edema. He was switched his Santyl last visit which the nurses are applying once during his doctor visit and once on [name] nurse visit. He was also reduced to 2 layer compression I'[name] not exactly sure of the issue here. 07/21/17; better surface today after 1 week of  Iodoflex. Significant cellulitis that we treated last week also better. [Doxycycline] 07/28/17 better surface today with now 2 weeks of Iodoflex. Significant cellulitis treated with doxycycline. He has now completed the doxycycline and he is back to his usual degree of chronic venous inflammation/stasis dermatitis. He reminds me he has had ablations surgery here 08/04/17; continued improvement with Iodoflex to the left lateral leg wound in terms of the surface of the wound although the dimensions are better. 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 Problematic areas on the plantar aspect of the first second toe  web space on the left and the dorsal aspect of the first second toe web space on the right. At one point I felt these were probably related to chronic fungal infections in treated him aggressively for this although we have not made any improvement here. 08/11/17; left lateral leg. Surface continues to improve with the Iodoflex although we are not seeing much improvement in overall wound dimensions. Areas on his plantar left foot and right foot show no improvement. In fact the right foot looks somewhat worse 08/18/17; left lateral leg. We changed to [facility] Blue last week after [name] prolonged course of Iodoflex which helps get the surface better. It appears that the wound with is improved. Continue with difficult areas on the left dorsal first second and plantar first second on the right 09/01/17; patient arrives in clinic today having had [name] temperature of 103 yesterday. He was seen in the ER and [facility]. The patient was concerned he could have cellulitis again in the right leg however they diagnosed him with [name] UTI and he is now on Keflex. He has [name] history of cellulitis which is been recurrent and difficult but this is been in the left leg, in the past 5 use doxycycline. He does in and out catheterizations at home which are risk factors for UTI 09/08/17; patient will be completing his Keflex this weekend. The erythema on the left leg is considerably better. He has [name] new wound today on the medial part of the right leg small superficial almost looks like [name] skin tear. He has worsening of the area on the right dorsal first and second toe. His major area on the left lateral leg is better. Using Hydrofera Blue on all areas 09/15/17; gradual reduction in width on the long wound in the left lateral leg. No debridement required. He also has wounds on the plantar aspect of his left first second toe web space and on the dorsal aspect of the right first second toe web space. 09/22/17; there continues to be very  gradual improvements in the dimensions of the left lateral leg wound. He hasn't round erythematous spot with might be pressure on his wheelchair. There is no evidence obviously of infection no purulence no warmth ooHe has [name] dry scaled area on the plantar aspect of the left first second toe ooImproved area on the dorsal right first second toe. 09/29/17; left lateral leg wound continues to improve in dimensions mostly with an is still [name] fairly long but increasingly narrow wound. ooHe has [name] dry scaled area on the plantar aspect of his left first second toe web space ooIncreasingly concerning area on the dorsal right first second toe. In fact I am concerned today about possible cellulitis around this wound. The areas extending up his second toe and although there is deformities here almost appears to abut on the nailbed. 10/06/17; left lateral leg wound continues  to make very gradual progress. Tissue culture I did from the right first second toe dorsal foot last time grew MRSA and enterococcus which was vancomycin sensitive. This was not sensitive to clindamycin or doxycycline. He is allergic to Zyvox and sulfa we have therefore arrange for him to have dalvance infusion tomorrow. He is had this in the past and tolerated it well 10/20/17; left lateral leg wound continues to make decent progress. This is certainly reduced in terms of with there is advancing epithelialization.ooThe cellulitis in the right foot looks better although he still has [name] deep wound in the dorsal aspect of the first second toe web space. Plantar left first toe web space on the left I think is making some progress 10/27/17; left lateral leg wound continues to make decent progress. Advancing epithelialization.using Hydrofera Blue ooThe right first second toe web space wound is better-looking using silver alginate ooImprovement in the left plantar first second toe web space. Again using silver alginate 11/03/17 left lateral leg  wound continues to make decent progress albeit slowly. Using Hydrofera Blue ooThe right per second toe web space continues to be [name] very problematic looking punched out wound. I obtained [name] piece of tissue for deep culture I did extensively treated this for fungus. It is difficult to imagine that this is [name] pressure area as the patient states other than going outside he doesn't really wear shoes at home ooThe left plantar first second toe web space looked fairly senescent. Necrotic edges. This required debridement oochange to Hydrofera Blue to all wound areas 11/10/17; left lateral leg wound continues to contract. Using Hydrofera Blue ooOn the right dorsal first second toe web space dorsally. Culture I did of this area last week grew MRSA there is not an easy oral option in this patient was multiple antibiotic allergies or intolerances. This was only [name] rare culture isolate I'[name] therefore going to use Bactroban under silver alginate ooOn the left plantar first second toe web space. Debridement is required here. This is also unchanged 11/17/17; left lateral leg wound continues to contract using Hydrofera Blue this is no longer the major issue. ooThe major concern here is the right first second toe web space. He now has an open area going from dorsally to the plantar aspect. There is now wound on the inner lateral part of the first toe. Not [name] very viable surface on this. There is erythema spreading medially into the forefoot. ooNo major change in the left first second toe plantar wound 11/24/17; left lateral leg wound continues to contract using Hydrofera Blue. Nice improvement today ooThe right first second toe web space all of this looks [name] lot less angry than last week. I have given him clindamycin and topical Bactroban for MRSA and terbinafine for the possibility of underlining tinea pedis that I could not control with ketoconazole. Looks somewhat better ooThe area on the plantar left first  second toe web space is weeping with dried debris around the wound 12/01/17; left lateral leg wound continues to contract he Hydrofera Blue. It is becoming thinner in terms of with nevertheless it is making good improvement. ooThe right first second toe web space looks less angry but still [name] large necrotic-looking wounds starting on the plantar aspect of the right foot extending between the toes and now extensively on the base of the right second toe. I gave him clindamycin and topical Bactroban for MRSA anterior benefiting for the possibility of underlying tinea pedis. Not looking better today ooThe area on the  left first/second toe looks better. Debrided of necrotic debris 12/05/17* the patient was worked in urgently today because over the weekend he found blood on his incontinence bad when he woke up. He was found to have an ulcer by his wife who does most of his wound care. He came in today for [place] to look at this. He has not had [name] history of wounds in his buttocks in spite of his paraplegia. 12/08/17; seen in follow-up today at his usual appointment. He was seen earlier this week and found to have [name] new wound on his buttock. We also follow him for wounds on the left lateral leg, left first second toe web space and right first second toe web space 12/15/17; we have been using Hydrofera Blue to the left lateral leg which has improved. The right first second toe web space has also improved. Left first second toe web space plantar aspect looks stable. The left buttock has worsened using Santyl. Apparently the buttock has drainage 12/22/17; we have been using Hydrofera Blue to the left lateral leg which continues to improve now 2 small wounds separated by normal skin. He tells [place] he had [name] fever up to 100 yesterday he is prone to UTIs but has not noted anything different. He does in and out catheterizations. The area between the first and second toes today does not look good necrotic surface covered with  what looks to be purulent drainage and erythema extending into the third toe. I had gotten this to something that I thought look better last time however it is not look good today. He also has [name] necrotic surface over the buttock wound which is expanded. I thought there might be infection under here so I removed [name] lot of the surface with [name] #5 curet though nothing look like it really needed culturing. He is been using Santyl to this area 12/27/17; his original wound on the left lateral leg continues to improve using Hydrofera Blue. I gave him samples of Baxdella although he was unable to take them out of fear for an allergic reaction ["lump in his throat"].the culture I did of the purulent drainage from his second toe last week showed both enterococcus and [name] set Enterobacter I was also concerned about the erythema on the bottom of his foot although paradoxically although this looks somewhat better today. 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 We've been using silver alginate here. They came up today that he sleeps in [name] recliner, I'[name] not sure why but I asked him to stop this 01/03/18; his original wound we've been using Hydrofera Blue is now separated into 2 areas. ooUlcer on his left buttock is better he is off the recliner and sleeping in bed ooFinally both wound areas between his first and second toes also looks some better 01/10/18; his original wound on the left lateral leg is now separated into 2 wounds we've been using Hydrofera Blue ooUlcer on his left buttock has some drainage. There is [name] small probing site going into muscle layer superiorly.using silver alginate -He arrives today with [name] deep tissue injury on the left heel ooThe wound on the dorsal aspect of his first second toe on the left looks [name] lot betterusing silver alginate ketoconazole ooThe area on the first second toe web space on the  right also looks [name] lot bette 01/17/18; his original wound on the left lateral leg continues to  progress using Hydrofera Blue ooUlcer on his left buttock also is smaller surface healthier except for [name] small probing site going into the muscle layer superiorly. 2.4 cm of tunneling in this area ooDTI on his left heel we have only been offloading. Looks better than last week no threatened open no evidence of infection oothe wound on the dorsal aspect of the first second toe on the left continues to look like it's regressing we have only been using silver alginate and terbinafine orally ooThe area in the first second toe web space on the right also looks to be [name] lot better using silver alginate and terbinafine I think this was prompted by tinea pedis 01/31/18; the patient was hospitalized in [place] last week apparently for [name] complicated UTI. He was discharged on cefepime he does in and out catheterizations. In the hospital he was discovered [name] I don't mild elevation of AST and ALT and the terbinafine was stopped.predictably the pressure ulcer on s his buttock looks betterusing silver alginate. The area on the left lateral leg also is better using Hydrofera Blue. The area between the first and second toes on the left better. First and second toes on the right still substantial but better. Finally the DTI on the left heel has held together and looks like it's resolving 02/07/18-he is here in follow-up evaluation for multiple ulcerations. He has new injury to the lateral aspect of the last issue [name] pressure ulcer, he states this is from adhesive removal trauma. He states he has tried multiple adhesive products with no success. All other ulcers appear stable. The left heel DTI is resolving. We will continue with same treatment plan and follow-up next week. 02/14/18; follow-up for multiple areas. ooHe has [name] new area last week on the lateral aspect of his pressure ulcer more over the posterior trochanter. The  original pressure ulcer looks quite stable has healthy granulation. 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 Almost fully epithelialized on the original superior area using Hydrofera Blue ooDTI on the left heel has peeled off this week to reveal [name] small superficial wound under denuded skin and subcutaneous tissue ooBoth areas between the first and second toes look better including nothing open on the left 02/21/18; ooThe patient's wounds on his left ischial tuberosity and posterior left greater trochanter actually looked better. He has [name] large area of irritation around the area which I think is contact dermatitis. I am doubtful that this is fungal ooHis original wound on the left lateral calf continues to improve we have been using Hydrofera Blue ooThere is no open area in the left first second toe web space although there is [name] lot of thick callus ooThe DTI on the left heel required debridement today of necrotic surface eschar and subcutaneous tissue using silver alginate ooFinally the area on the right first second toe webspace continues to contract using silver alginate and ketoconazole 02/28/18 ooLeft ischial tuberosity wounds look better using silver alginate. ooOriginal wound on the left calf only has one small open area left using Hydrofera Blue ooDTI on the left heel required debridement mostly removing skin from around this wound surface. Using silver alginate ooThe areas on the right first/second toe web space using silver alginate and ketoconazole 03/08/18 on evaluation today patient appears to be doing decently well as best I can tell in regard to his wounds. This is the first time that I have seen  him as he generally is followed by Dr. Dellia Nims. With that being said none of his wounds appear to be infected he does have an area where there is some skin covering what appears  to be [name] new wound on the left dorsal surface of his great toe. This is right at the nail bed. With that being said I do believe that debrided away some of the excess skin can be of benefit in this regard. Otherwise he has been tolerating the dressing changes without complication. 03/14/18; patient arrives today with the multiplicity of wounds that we are following. He has not been systemically unwell ooOriginal wound on the left lateral calf now only has 2 small open areas we've been using Hydrofera Blue which should continue ooThe deep tissue injury on the left heel requires debridement today. We've been using silver alginate ooThe left first second toe and the right first second toe are both are reminiscence what I think was tinea pedis. Apparently some of the callus Surface between the toes was removed last week when it started draining. ooPurulent drainage coming from the wound on the ischial tuberosity on the left. 03/21/18-He is here in follow-up evaluation for multiple wounds. 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 He tolerated debridement. The only change to last week's recommendations is to discontinue antifungal cream between toes. He will follow-up next week 03/28/18; following up for multiple wounds;Concern this week is streaking redness and swelling in the right foot. He is going to need antibiotics for this. 03/31/18; follow-up for right foot cellulitis. Streaking redness and swelling in the right foot on 03/28/18. 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 I put him on clindamycin 300 mg every 6 and brought him in for [name] quick check. He has an open wound between his first and second toes on the right foot as [name] potential source. 04/04/18; ooRight foot cellulitis is resolving he is completing clindamycin. This is truly good news ooLeft lateral calf wound which is initial wound only has one  small open area inferiorly this is close to healing out. He has compression stockings. We will use Hydrofera Blue right down to the epithelialization of this ooNonviable surface on the left heel which was initially pressure with [name] DTI. We've been using Hydrofera Blue. I'[name] going to switch this back to silver alginate ooLeft first second toe/tinea pedis this looks better using silver alginate ooRight first second toe tinea pedis using silver alginate ooLarge pressure ulcers on theLeft ischial tuberosity. Small wound here Looks better. I am uncertain about the surface over the large wound. Using silver alginate 04/11/18; ooCellulitis in the right foot is resolved ooLeft lateral calf wound which was his original wounds still has 2 tiny open areas remaining this is just about closed ooNonviable surface on the left heel is better but still requires debridement ooLeft first second toe/tinea pedis still open using silver alginate ooRight first second toe wound tinea pedis I asked him to go back to using ketoconazole and silver alginate ooLarge pressure ulcers on the left ischial tuberosity this shear injury here is resolved. Wound is smaller. No evidence of infection using silver alginate 04/18/18; ooPatient arrives with an intense area of cellulitis in the right mid lower calf extending into the right heel area. Bright red and warm. Smaller area on the left anterior leg. He has [name] significant history of MRSA. He will definitely need antibioticsoodoxycycline ooHe now has 2 open areas on  the left ischial tuberosity the original large wound and now [name] satellite area which I think was above his initial satellite areas. Not [name] wonderful surface on this satellite area surrounding erythema which looks like pressure related. ooHis left lateral calf wound again his original wound is just about closed ooLeft heel pressure injury still requiring debridement ooLeft first second toe looks [name] lot better  using silver alginate ooRight first second toe also using silver alginate and ketoconazole cream also looks better 04/20/18; the patient was worked in early today out of concerns with his cellulitis on the right leg. I had started him on doxycycline. This was 2 days ago. His wife was concerned about the swelling in the area. Also concerned about the left buttock. He has not been systemically unwell no fever chills. No nausea vomiting or diarrhea 04/25/18; the patient's left buttock wound is continued to deteriorate he is using Hydrofera Blue. He is still completing clindamycin for the cellulitis on the right leg although all of this looks better. 05/02/18 ooLeft buttock wound still with [name] lot of drainage and [name] very tightly adherent fibrinous necrotic surface. He has [name] deeper area superiorly ooThe left lateral calf wound is still closed ooDTI wound on the left heel necrotic surface especially the circumference using Iodoflex ooAreas between his left first second toe and right first second toe both look better. Dorsally and the right first second toe he had [name] necrotic surface although at smaller. In using silver alginate and ketoconazole. I did [name] culture last week which was [name] deep tissue culture of the reminiscence of the open wound on the right first second toe dorsally. This grew [name] few Acinetobacter and [name] few methicillin-resistant staph aureus. Nevertheless the area actually this week looked better. I didn't feel the need to specifically address this at least in terms of systemic antibiotics. 05/09/18; wounds are measuring larger more drainage per our intake. We are using Santyl covered with alginate on the large superficial buttock wounds, Iodosorb on the left heel, ketoconazole and silver alginate to the dorsal first and second toes bilaterally. 05/16/18; ooThe area on his left buttock better in some aspects although the area superiorly over the ischial tuberosity required an extensive  debridement.using Santyl ooLeft heel appears stable. Using Iodoflex ooThe areas between his first and second toes are not bad however there is spreading erythema up the dorsal aspect of his left foot this looks like cellulitis again. He is insensate the erythema is really very brilliant.o Erysipelas He went to see an allergist days ago because he was itching part of this he had lab work done. This showed [name] white count of 15.1 with 70% neutrophils. Hemoglobin of 11.4 and [name] platelet count of 659,000. Last white count we had in [facility] was [name] 2-1/2 years ago which was 25.9 but he was ill at the time. He was able to show me some lab work that was done by his primary physician the pattern is about the same. 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 But prompted me to go ahead and do x-rays of both feet and the pelvis rule out osteomyelitis. He also had [name] comprehensive metabolic panel this was reasonably normal his albumin was 3.7 liver function tests BUN/creatinine all normal 05/23/18; x-rays of both his feet from last week were negative for underlying pulmonary abnormality. The x-ray of his pelvis however showed mild irregularity in the left ischial which may represent some early osteomyelitis. The  wound in the left ischial continues to get deeper clearly now exposed muscle. Each week necrotic surface material over this area. Whereas the rest of the wounds do not look so bad. ooThe left ischial wound we have been using Santyl and calcium alginate ooT the left heel surface necrotic debris using Iodoflex o ooThe left lateral leg is still healed ooAreas on the left dorsal foot and the right dorsal foot are about the same. There is some inflammation on the left which might represent contact dermatitis, fungal dermatitis I am doubtful cellulitis although this looks better than last week 05/30/18; CT scan done at Hospital did not show any osteomyelitis or abscess.  Suggested the possibility of underlying cellulitis although I don't see [name] lot of evidence of this at the bedside ooThe wound itself on the left buttock/upper thigh actually looks somewhat better. No debridement ooLeft heel also looks better no debridement continue Iodoflex ooBoth dorsal first second toe spaces appear better using Lotrisone. Left still required debridement 06/06/18; ooIntake reported some purulent looking drainage from the left gluteal wound. Using Santyl and calcium alginate ooLeft heel looks better although still [name] nonviable surface requiring debridement ooThe left dorsal foot first/second webspace actually expanding and somewhat deeper. I may consider doing [name] shave biopsy of this area ooRight dorsal foot first/second webspace appears stable to improved. Using Lotrisone and silver alginate to both these areas 06/13/18 ooLeft gluteal surface looks better. Now separated in the 2 wounds. No debridement required. Still drainage. We'll continue silver alginate ooLeft heel continues to look better with Iodoflex continue this for at least another week ooOf his dorsal foot wounds the area on the left still has some depth although it looks better than last week. We've been using Lotrisone and silver alginate 06/20/18 ooLeft gluteal continues to look better healthy tissue ooLeft heel continues to look better healthy granulation wound is smaller. He is using Iodoflex and his long as this continues continue the Iodoflex ooDorsal right foot looks better unfortunately dorsal left foot does not. There is swelling and erythema of his forefoot. He had minor trauma to this several days ago but doesn't think this was enough to have caused any tissue injury. Foot looks like cellulitis, we have had this problem before 06/27/18 on evaluation today patient appears to be doing [name] little worse in regard to his foot ulcer. 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 Both of which would really be his only options for treating this infection. In the past he has been given and effusion of Orbactiv. This is done very well for him in the past again it's one time dosing IV antibiotic therapy. Subsequently I do believe this is something we're gonna need to see about doing at this point in time. Currently his other wounds seem to be doing somewhat better in my pinion I'[name] pretty happy in that regard. 07/03/18 on evaluation today patient's wounds actually appear to be doing fairly well. He has been tolerating the dressing changes without complication. All in all he seems to be showing signs of improvement. In regard to the antibiotics he has been dealing with infectious disease since I saw him last week as far as getting this scheduled. In the end [facility]'s [facility] this coming Friday. In the meantime he has been continuing  to perform the dressing changes in such as previous. There does not appear to be any evidence of infection worsengin at this time. 07/10/18; ooSince I last saw this man 2 weeks ago things have actually improved. IV antibiotics of resulted in less forefoot erythema although there is still some present. He is not systemically unwell ooLeft buttock wounds o2 now have no depth there is increased epithelialization Using silver alginate ooLeft heel still requires debridement using Iodoflex ooLeft dorsal foot still with [name] sizable wound about the size of [name] border but healthy granulation ooRight dorsal foot still with [name] slitlike area using silver alginate 07/18/18; the patient's cellulitis in the left foot is improved in fact I think it is on its way to resolving. ooLeft buttock wounds o2 both look better although the larger one has hypertension granulation we've been using silver  alginate ooLeft heel has some thick circumferential redundant skin over the wound edge which will need to be removed today we've been using Iodoflex ooLeft dorsal foot is still [name] sizable wound required debridement using silver alginate ooThe right dorsal foot is just about closed only [name] small open area remains here 07/25/18; left foot cellulitis is resolved ooLeft buttock wounds o2 both look better. Hyper-granulation on the major area ooLeft heel as some debris over the surface but otherwise looks [name] healthier wound. Using silver collagen ooRight dorsal foot is just about closed 07/31/18; arrives with our intake nurse worried about purulent drainage from the buttock. We had hyper-granulation here last week ooHis buttock wounds o2 continue to look better ooLeft heel some debris over the surface but measuring smaller. ooRight dorsal foot unfortunately has openings between the toes ooLeft foot superficial wound looks less aggravated. 08/07/18 ooButtock wounds continue to look better although some of her granulation and the larger medial wound. silver alginate ooLeft heel continues to look [name] lot better.silver collagen ooLeft foot superficial wound looks less stable. Requires debridement. He has [name] new wound superficial area on the foot on the lateral dorsal foot. ooRight foot looks better using silver alginate without Lotrisone 08/14/2018; patient was in the ER last week diagnosed with [name] UTI. He is now on Cefpodoxime and Macrodantin. ooButtock wounds continued to be smaller. Using silver alginate ooLeft heel continues to look better using silver collagen ooLeft foot superficial wound looks as though it is improving ooRight dorsal foot area is just about healed. 08/21/2018; patient is completed his antibiotics for his UTI. ooHe has 2 open areas on the buttocks. There is still not closed although the surface looks satisfactory. Using silver alginate ooLeft heel continues to improve  using silver collagen ooThe bilateral dorsal foot areas which are at the base of his first and second toes/possible tinea pedis are actually stable on the left but worse on the right. The area on the left required debridement of necrotic surface. After debridement I obtained [name] specimen for PCR culture. ooThe right dorsal foot which is been just about healed last week is now reopened 08/28/2018; culture done on the left dorsal foot showed coag negative staph both staph epidermidis and Lugdunensis. I think this is worthwhile initiating systemic treatment. I will use doxycycline given his long list of allergies. The area on the left heel slightly improved but still requiring debridement. ooThe large wound on the buttock is just about closed whereas the smaller one is larger. Using silver alginate in this area 09/04/2018; patient is completing his doxycycline for the left foot although this continues to be [name] very difficult wound area  with very adherent necrotic debris. We are using silver alginate to all his wounds right foot left foot and the small wounds on his buttock, silver collagen on the left heel. 09/11/2018; once again this patient has intense erythema and swelling of the left forefoot. Lesser degrees of erythema in the right foot. He has [name] long list of allergies and intolerances. I will reinstitute doxycycline. 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 Using silver alginate ooLeft heel also looks better using silver collagen ooUnfortunately both the areas on his feet look worse. The area on the left first second webspace is now gone through to the plantar part of his foot. The area on the left foot anteriorly is irritated with erythema and swelling in the forefoot. 09/25/2018 ooHis wound on the left plantar heel looks better. Using silver collagen ooThe area on the left buttock 2 small remnant areas. One is closed one is still open.  Using silver alginate ooThe areas between both his first and second toes look worse. This in spite of long-standing antifungal therapy with ketoconazole and silver alginate which should have antifungal activity ooHe has small areas around his original wound on the left calf one is on the bottom of the original scar tissue and one superiorly both of these are small and superficial but again given wound history in this site this is worrisome 10/02/2018 ooLeft plantar heel continues to gradually contract using silver collagen ooLeft buttock wound is unchanged using silver alginate ooThe areas on his dorsal feet between his first and second toes bilaterally look about the same. 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 Small open area here. We have been using silver alginate 10/09/2018; ooLeft plantar heel continues to look healthy and contract. No debridement is required ooLeft buttock slightly smaller with [name] tape injury wound just below which was new this week ooDorsal feet somewhat improved I have been using clindamycin ooLeft lateral looks lower extremity the actual open area looks worse although [name] lot of this is epithelialized. I am going to change to silver collagen today He has [name] lot more swelling in the right leg although this is not pitting not red and not particularly warm there is [name] lot of spasm in the right leg usually indicative of people with paralysis of some underlying discomfort. We have reviewed his vascular status from 2017 he had [name] left greater saphenous vein ablation. I wonder about referring him back to vascular surgery if the area on the left leg continues to deteriorate. 10/16/2018 in today for follow-up and management of multiple lower extremity ulcers. His left Buttock wound is much  lower smaller and almost closed completely. The wound to the left ankle has began to reopen with Epithelialization and some adherent slough. He has multiple new areas to the left foot and leg. The left dorsal foot without much improvement. Wound present between left great webspace and 2nd toe. Erythema and edema present right leg. Right LE ultrasound obtained on 10/10/18 was negative for DVT . 10/23/2018; ooLeft buttock is closed over. Still dry macerated skin but there is no open wound. I suspect this is chronic pressure/moisture ooLeft lateral calf is quite [name] bit worse than when I saw this last. There is clearly drainage here he has macerated skin into the left  plantar heel. We will change the primary dressing to alginate ooLeft dorsal foot has some improvement in overall wound area. Still using clindamycin and silver alginate ooRight dorsal foot about the same as the left using clindamycin and silver alginate ooThe erythema in the right leg has resolved. He is DVT rule out was negative ooLeft heel pressure area required debridement although the wound is smaller and the surface is health 10/26/2018 ooThe patient came back in for his nurse check today predominantly because of the drainage coming out of the left lateral leg with [name] recent reopening of his original wound on the left lateral calf. He comes in today with [name] large amount of surrounding erythema around the wound extending from the calf into the ankle and even in the area on the dorsal foot. He is not systemically unwell. He is not febrile. Nevertheless this looks like cellulitis. We have been using silver alginate to the area. I changed him to [name] regular visit and I am going to prescribe him doxycycline. The rationale here is [name] long list of medication intolerances and [name] history of MRSA. I did not see anything that I thought would provide [name] valuable culture 10/30/2018 ooFollow-up from his appointment 4 days ago with really an extensive  area of cellulitis in the left calf left lateral ankle and left dorsal foot. I put him on doxycycline. He has [name] long list of medication allergies which are true allergy reactions. 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 In any case he is [name] lot better today. The erythema especially in the anterior and lateral left calf is better. He still has left ankle erythema. He also is complaining about increasing edema in the right leg we have only been using Kerlix Coban and he has been doing the wraps at home. Finally he has [name] spotty rash on the medial part of his upper left calf which looks like folliculitis or perhaps wrap occlusion type injury. Small superficial macules not pustules 11/06/18 patient arrives today with again [name] considerable degree of erythema around the wound on the left lateral calf extending into the dorsal ankle and dorsal foot. This is [name] lot worse than when I saw this last week. He is on doxycycline really with not [name] lot of improvement. He has not been systemically unwell Wounds on the; left heel actually looks improved. Original area on the left foot and proximity to the first and second toes looks about the same. He has superficial areas on the dorsal foot, anterior calf and then the reopening of his original wound on the left lateral calf which looks about the same ooThe only area he has on the right is the dorsal webspace first and second which is smaller. ooHe has [name] large area of dry erythematous skin on the left buttock small open area here. 11/13/2018; the patient arrives in much better condition. The erythema around the wound on the left lateral calf is [name] lot better. Not sure whether this was the clindamycin or the TCA and ketoconazole or just in the improvement in edema control [stasis dermatitis]. In any case this is [name] lot better. The area on the left heel is very small and just about resolved using silver  collagen we have been using silver alginate to the areas on his dorsal feet 11/20/2018; his wounds include the left lateral calf, left heel, dorsal aspects of both feet just proximal to the first second webspace. He  is stable to slightly improved. I did not think any changes to his dressings were going to be necessary 11/27/2018 he has [name] reopening on the left buttock which is surrounded by what looks like tinea or perhaps some other form of dermatitis. The area on the left dorsal foot has some erythema around it I have marked this area but I am not sure whether this is cellulitis or not. Left heel is not closed. Left calf the reopening is really slightly longer and probably worse 1/13; in general things look better and smaller except for the left dorsal foot. Area on the left heel is just about closed, left buttock looks better only [name] small wound remains in the skin looks better [using Lotrisone] 1/20; the area on the left heel only has [name] few remaining open areas here. Left lateral calf about the same in terms of size, left dorsal foot slightly larger right lateral foot still not closed. The area on the left buttock has no open wound and the surrounding skin looks [name] lot better 1/27; the area on the left heel is closed. Left lateral calf better but still requiring extensive debridements. The area on his left buttock is closed. He still has the open areas on the left dorsal foot which is slightly smaller in the right foot which is slightly expanded. We have been using Iodoflex on these areas as well 2/3; left heel is closed. Left lateral calf still requiring debridement using Iodoflex there is no open area on his left buttock however he has dry scaly skin over [name] large area of this. Not really responding well to the Lotrisone. Finally the areas on his dorsal feet at the level of the first second webspace are slightly smaller on the right and about the same on the left. Both of these vigorously debrided with  Anasept and gauze 2/10; left heel remains closed he has dry erythematous skin over the left buttock but there is no open wound here. Left lateral leg has come in and with. Still requiring debridement we have been using Iodoflex here. Finally the area on the left dorsal foot and right dorsal foot are really about the same extremely dry callused fissured areas. He does not yet have [name] dermatology appointment 2/17; left heel remains closed. He has [name] new open area on the left buttock. The area on the left lateral calf is bigger longer and still covered in necrotic debris. No major change in his foot areas bilaterally. I am awaiting for [name] dermatologist to look on this. We have been using ketoconazole I do not know that this is been doing any good at all. 2/24; left heel remains closed. The left buttock wound that was new reopening last week looks better. The left lateral calf appears better also although still requires debridement. The major area on his foot is the left first second also requiring debridement. We have been putting Prisma on all wounds. I do not believe that the ketoconazole has done too much good for his feet. He will use Lotrisone I am going to give him [name] 2-week course of terbinafine. We still do not have [name] dermatology appointment 3/2 left heel remains closed however there is skin over bone in this area I pointed this out to him today. The left buttock wound is epithelialized but still does not look completely stable. The area on the left leg required debridement were using silver collagen here. With regards to his feet we changed to Lotrisone last week and  silver alginate. 3/9; left heel remains closed. Left buttock remains closed. The area on the right foot is essentially closed. The left foot remains unchanged. Slightly smaller on the left lateral calf. Using silver collagen to both of these areas 3/16-Left heel remains closed. Area on right foot is closed. Left lateral calf above the  lateral malleolus open wound requiring debridement with easy bleeding. Left dorsal wound proximal to first toe also debrided. Left ischial area open new. Patient has been using Prisma with wrapping every 3 days. Dermatology appointment is apparently tomorrow.Patient has completed his terbinafine 2-week course with some apparent improvement according to him, there is still flaking and dry skin in his foot on the left 3/23; area on the right foot is reopened. The area on the left anterior foot is about the same still [name] very necrotic adherent surface. He still has the area on the left leg and reopening is on the left buttock. He apparently saw dermatology although I do not have [name] note. According to the patient who is usually fairly well informed they did not have any good ideas. Put him on oral terbinafine which she is been on before. 3/30; using silver collagen to all wounds. Apparently his dermatologist put him on doxycycline and rifampin presumably some culture grew staph. I do not have this result. He remains on terbinafine although I have used terbinafine on him before 4/6; patient has had [name] fairly substantial reopening on the right foot between the first and second toes. He is finished his terbinafine and I believe is on doxycycline and rifampin still as prescribed by dermatology. We have been using silver collagen to all his wounds although the patient reports that he thinks silver alginate does better on the wounds on his buttock. 4/13; the area on his left lateral calf about the same size but it did not require debridement. ooLeft dorsal foot just proximal to the webspace between the first and second toes is about the same. Still nonviable surface. I note some superficial bronze discoloration of the dorsal part of his foot ooRight dorsal foot just proximal to the first and second toes also looks about the same. I still think there may be the same discoloration I noted above on  the left ooLeft buttock wound looks about the same 4/20; left lateral calf appears to be gradually contracting using silver collagen. 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 The left dorsal foot wound is debrided of tightly adherent necrotic debris and really cleans up quite nicely. The right area is worse with expansion. I did not debride this it is now over the base of the second toe ooThe area on his left buttock is smaller no debridement is required using silver collagen 5/4; left calf continues to make good progress. ooHe arrives with erythema around the wounds on his dorsal foot which even extends to the plantar aspect. Very concerning for coexistent infection. He is finished the erythromycin I gave him for possible erythrasma this does not seem to have helped. ooThe area on the left foot is about the same base of the dorsal toes ooIs area on the buttock looks improved on the left 5/11; left calf and left buttock continued to make good progress. Left foot is about the same to slightly improved. ooMajor problem is on the right foot. He has not had an x-ray. Deep tissue culture I did last week showed both Enterobacter and E. coli. I did not change the doxycycline I  put him on empirically although neither 1 of these were plated to doxycycline. He arrives today with the erythema looking worse on both the dorsal and plantar foot. Macerated skin on the bottom of the foot. he has not been systemically unwell 5/18-Patient returns at 1 week, left calf wound appears to be making some progress, left buttock wound appears slightly worse than last time, left foot wound looks slightly better, right foot redness is marginally better. X-ray of both feet show no air or evidence of osteomyelitis. Patient is finished his Omnicef and terbinafine. He continues to have macerated skin on the bottom of the left foot as well as right 5/26; left calf  wound is better, left buttock wound appears to have multiple small superficial open areas with surrounding macerated skin. X-rays that I did last time showed no evidence of osteomyelitis in either foot. He is finished cefdinir and doxycycline. I do not think that he was on terbinafine. He continues to have [name] large superficial open area on the right foot anterior dorsal and slightly between the first and second toes. I did send him to dermatology 2 months ago or so wondering about whether they would do [name] fungal scraping. I do not believe they did but did do [name] culture. We have been using silver alginate to the toe areas, he has been using antifungals at home topically either ketoconazole or Lotrisone. We are using silver collagen on the left foot, silver alginate on the right, silver collagen on the left lateral leg and silver alginate on the left buttock 6/1; left buttock area is healed. We have the left dorsal foot, left lateral leg and right dorsal foot. We are using silver alginate to the areas on both feet and silver collagen to the area on his left lateral calf 6/8; the left buttock apparently reopened late last week. He is not really sure how this happened. He is tolerating the terbinafine. Using silver alginate to all wounds 6/15; left buttock wound is larger than last week but still superficial. ooCame in the clinic today with [name] report of purulence from the left lateral leg I did not identify any infection ooBoth areas on his dorsal feet appear to be better. He is tolerating the terbinafine. Using silver alginate to all wounds 6/22; left buttock is about the same this week, left calf quite [name] bit better. His left foot is about the same however he comes in with erythema and warmth in the right forefoot once again. Culture that I gave him in the beginning of May showed Enterobacter and E. coli. I gave him doxycycline and things seem to improve although neither 1 of these organisms was  specifically plated. 6/29; left buttock is larger and dry this week. Left lateral calf looks to me to be improved. Left dorsal foot also somewhat improved right foot completely unchanged. The erythema on the right foot is still present. He is completing the Ceftin dinner that I gave him empirically [see discussion above.) 7/6 - All wounds look to be stable and perhaps improved, the left buttock wound is slightly smaller, per patient bleeds easily, completed ceftin, the right foot redness is less, he is on terbinafine 7/13; left buttock wound about the same perhaps slightly narrower. Area on the left lateral leg continues to narrow. Left dorsal foot slightly smaller right foot about the same. We are using silver alginate on the right foot and Hydrofera Blue to the areas on the left. Unna boot on the left 2 layer compression  on the right 7/20; left buttock wound absolutely the same. Area on lateral leg continues to get better. Left dorsal foot require debridement as did the right no major change in the 7/27; left buttock wound the same size necrotic debris over the surface. The area on the lateral leg is closed once again. His left foot looks better right foot about the same although there is some involvement now of the posterior first second toe area. He is still on terbinafine which I have given him for [name] month, not certain [name] centimeter major change 06/25/19-All wounds appear to be slightly improved according to report, left buttock wound looks clean, both foot wounds have minimal to no debris the right dorsal foot has minimal slough. We are using Hydrofera Blue to the left and silver alginate to the right foot and ischial wound. 8/10-Wounds all appear to be around the same, the right forefoot distal part has some redness which was not there before, however the wound looks clean and small. Ischial wound looks about the same with no changes 8/17; his wound on the left lateral calf which was his  original chronic venous insufficiency wound remains closed. Since I last saw him the areas on the left dorsal foot right dorsal foot generally appear better but require debridement. The area on his left initial tuberosity appears somewhat larger to me perhaps hyper granulated and bleeds very easily. We have been using Hydrofera Blue to the left dorsal foot and silver alginate to everything else 8/24; left lateral calf remains closed. The areas on his dorsal feet on the webspace of the first and second toes bilaterally both look better. The area on the left buttock which is the pressure ulcer stage II slightly smaller. I change the dressing to Hydrofera Blue to all areas 8/31; left lateral calf remains closed. The area on his dorsal feet bilaterally look better. Using Hydrofera Blue. Still requiring debridement on the left foot. No change in the left buttock pressure ulcers however 9/14; left lateral calf remains closed. Dorsal feet look quite [name] bit better than 2 weeks ago. Flaking dry skin also [name] lot better with the ammonium lactate I gave him 2 weeks ago. The area on the left buttock is improved. He states that his Roho cushion developed [name] leak and he is getting [name] new one, in the interim he is offloading this vigorously 9/21; left calf remains closed. Left heel which was [name] possible DTI looks better this week. He had macerated tissue around the left dorsal foot right foot looks satisfactory and improved left buttock wound. I changed his dressings to his feet to silver alginate bilaterally. Continuing Hydrofera Blue on the left buttock. 9/28 left calf remains closed. Left heel did not develop anything [possible DTI] dry flaking skin on the left dorsal foot. Right foot looks satisfactory. Improved left buttock wound. We are using silver alginate on his feet Hydrofera Blue on the buttock. I have asked him to go back to the Lotrisone on his feet including the wounds and surrounding areas 10/5; left calf  remains closed. The areas on the left and right feet about the same. [name] lot of this is epithelialized however debris over the remaining open areas. He is using Lotrisone and silver alginate. The area on the left buttock using Hydrofera Blue 10/26. Patient has been out for 3 weeks secondary to Covid concerns. He tested negative but I think his wife tested positive. He comes in today with the left foot substantially worse, right foot  about the same. Even more concerning he states that the area on his left buttock closed over but then reopened and is considerably deeper in one aspect than it was before [stage III wound] 11/2; left foot really about the same as last week. Quarter sized wound on the dorsal foot just proximal to the first second toes. Surrounding erythema with areas of denuded epithelium. This is not really much different looking. Did not look like cellulitis this time however. ooRight foot area about the same.. We have been using silver alginate alginate on his toes ooLeft buttock still substantial irritated skin around the wound which I think looks somewhat better. We have been using Hydrofera Blue here. 11/9; left foot larger than last week and [name] very necrotic surface. Right foot I think is about the same perhaps slightly smaller. Debris around the circumference also addressed. Unfortunately on the left buttock there is been [name] decline. Satellite lesions below the major wound distally and now [name] an additional one posteriorly we have been using Hydrofera Blue but I think this is [name] pressure issue 11/16; left foot ulcer dorsally again [name] very adherent necrotic surface. Right foot is about the same. Not much change in the pressure ulcer on his left buttock. 11/30; left foot ulcer dorsally basically the same as when I saw him 2 weeks ago. Very adherent fibrinous debris on the wound surface. Patient reports [name] lot of drainage as well. The character of this wound has changed completely although it  has always been refractory. We have been using Iodoflex, patient changed back to alginate because of the drainage. Area on his right dorsal foot really looks benign with [name] healthier surface certainly [name] lot better than on the left. Left buttock wounds all improved using Hydrofera Blue 12/7; left dorsal foot again no improvement. Tightly adherent debris. PCR culture I did last week only showed likely skin contaminant. I have gone ahead and done [name] punch biopsy of this which is about the last thing in terms of investigations I can think to do. He has known venous insufficiency and venous hypertension and this could be the issue here. The area on the right foot is about the same left buttock slightly worse according to our intake nurse secondary to [facility] Blue sticking to the wound 12/14; biopsy of the left foot that I did last time showed changes that could be related to wound healing/chronic stasis dermatitis phenomenon no neoplasm. We have been using silver alginate to both feet. I change the one on the left today to Sorbact and silver alginate to his other 2 wounds 12/28; the patient arrives with the following problems; ooMajor issue is the dorsal left foot which continues to be [name] larger deeper wound area. Still with [name] completely nonviable surface ooParadoxically the area mirror image on the right on the right dorsal foot appears to be getting better. ooHe had some loss of dry denuded skin from the lower part of his original wound on the left lateral calf. Some of this area looked [name] little vulnerable and for this reason we put him in wrap that on this side this week ooThe area on his left buttock is larger. He still has the erythematous circular area which I think is [name] combination of pressure, sweat. This does not look like cellulitis or fungal dermatitis 11/26/2019; -Dorsal left foot large open wound with depth. Still debris over the surface. Using Sorbact ooThe area on the dorsal right foot  paradoxically has closed over [name] has [name]  reopening on the left ankle laterally at the base of his original wound that extended up into the calf. This appears clean. ooThe left buttock wound is smaller but with very adherent necrotic debris over the surface. We have been using silver alginate here as well The patient had arterial studies done in 2017. He had biphasic waveforms at the dorsalis pedis and posterior tibial bilaterally. ABI in the left was 1.17. Digit waveforms were dampened. He has slight spasticity in the great toes I do not think [name] TBI would be possible 1/11; the patient comes in today with [name] sizable reopening between the first and second toes on the right. This is not exactly in the same location where we have been treating wounds previously. According to our intake nurse this was actually fairly deep but 0.6 cm. The area on the left dorsal foot looks about the same the surface is somewhat cleaner using Sorbact, his MRI is in 2 days. We have not managed yet to get arterial studies. The new reopening on the left lateral calf looks somewhat better using alginate. The left buttock wound is about the same using alginate 1/18; the patient had his ARTERIAL studies which were quite normal. ABI in the right at 1.13 with triphasic/biphasic waveforms on the left ABI 1.06 again with triphasic/biphasic waveforms. It would not have been possible to have done [name] toe brachial index because of spasticity. We have been using Sorbac to the left foot alginate to the rest of his wounds on the right foot left lateral calf and left buttock 1/25; arrives in clinic with erythema and swelling of the left forefoot worse over the first MTP area. This extends laterally dorsally and but also posteriorly. Still has an area on the left lateral part of the lower part of his calf wound it is eschared and clearly not closed. ooArea on the left buttock still with surrounding irritation and erythema. ooRight foot  surface wound dorsally. The area between the right and first and second toes appears better. 2/1; ooThe left foot wound is about the same. Erythema slightly better I gave him [name] week of doxycycline empirically ooRight foot wound is more extensive extending between the toes to the plantar surface ooLeft lateral calf really no open surface on the inferior part of his original wound however the entire area still looks vulnerable ooAbsolutely no improvement in the left buttock wound required debridement. 2/8; the left foot is about the same. Erythema is slightly improved I gave him clindamycin last week. ooRight foot looks better he is using Lotrimin and silver alginate ooHe has [name] breakdown in the left lateral calf. Denuded epithelium which I have removed ooLeft buttock about the same were using Hydrofera Blue 2/15; left foot is about the same there is less surrounding erythema. Surface still has tightly adherent debris which I have debriding however not making any progress ooRight foot has [name] substantial wound on the medial right second toe between the first and second webspace. ooStill an open area on the left lateral calf distal area. ooButtock wound is about the same 2/22; left foot is about the same less surrounding erythema. Surface has adherent debris. Polymen Ag Right foot area significant wound between the first and second toes. We have been using silver alginate here Left lateral leg polymen Ag at the base of his original venous insufficiency wound ooLeft buttock some improvement here 3/1; ooRight foot is deteriorating in the first second toe webspace. Larger and more substantial. We have been using silver  alginate. ooLeft dorsal foot about the same markedly adherent surface debris using PolyMem Ag ooLeft lateral calf surface debris using PolyMem AG ooLeft buttock is improved again using PolyMem Ag. ooHe is completing his terbinafine. The erythema in the foot seems better.  He has been on this for 2 weeks 3/8; no improvement in any wound area in fact he has [name] small open area on the dorsal midfoot which is new this week. He has not gotten his foot x-rays yet 3/15; his x-rays were both negative for osteomyelitis of both feet. No major change in any of his wounds on the extremities however his buttock wounds are better. We have been using polymen on the buttocks, left lower leg. Iodoflex on the left foot and silver alginate on the right 3/22; arrives in clinic today with the 2 major issues are the improvement in the left dorsal foot wound which for once actually looks healthy with [name] nice healthy wound surface without debridement. Using Iodoflex here. Unfortunately on the left lateral calf which is in the distal part of his original wound he came to the clinic here for there was purulent drainage noted some increased breakdown scattered around the original area and [name] small area proximally. We we are using polymen here will change to silver alginate today. His buttock wound on the left is better and I think the area on the right first second toe webspace is also improved 3/29; left dorsal foot looks better. Using Iodoflex. Left ankle culture from deterioration last time grew E. coli, Enterobacter and Enterococcus. I will give him [name] course of cefdinir although that will not cover Enterococcus. The area on the right foot in the webspace of the first and second toe lateral first toe looks better. The area on his buttock is about healed Vascular appointment is on April 21. This is to look at his venous system vis--vis continued breakdown of the wounds on the left including the left lateral leg and left dorsal foot he. He has had previous ablations on this side 4/5; the area between the right first and second toes lateral aspect of the first toe looks better. Dorsal aspect of the left first toe on the left foot also improved. Unfortunately the left lateral lower leg is larger and  there is [name] second satellite wound superiorly. The usual superficial abrasions on the left buttock overall better but certainly not closed 4/12; the area between the right first and second toes is improved. Dorsal aspect of the left foot also slightly smaller with [name] vibrant healthy looking surface. No real change in the left lateral leg and the left buttock wound is healed He has an unaffordable co-pay for Apligraf. Appointment with vein and vascular with regards to the left leg venous part of the circulation is on 4/21 4/19; we continue to see improvement in all wound areas. Although this is minor. He has his vascular appointment on 4/21. The area on the left buttock has not reopened although right in the center of this area the skin looks somewhat threatened 4/26; the left buttock is unfortunately reopened. In general his left dorsal foot has [name] healthy surface and looks somewhat smaller although it was not measured as such. The area between his first and second toe webspace on the right as [name] small wound against the first toe. The patient saw vascular surgery. The real question I was asking was about the small saphenous vein on the left. He has previously ablated left greater saphenous vein. Nothing further  was commented on on the left. Right greater saphenous vein without reflux at the saphenofemoral junction or proximal thigh there was no indication for ablation of the right greater saphenous vein duplex was negative for DVT bilaterally. They did not think there was anything from [name] point of view that could be offered. They ABIs within normal limits 5/3; only small open area on the left buttock. The area on the left lateral leg which was his original venous reflux is now 2 wounds both which look clean. We are using Iodoflex on the left dorsal foot which looks healthy and smaller. He is down to [name] very tiny area between the right first and second toes, using silver alginate 5/10; all  of his wounds appear better. We have much better edema control in 4 layer compression on the left. This may be the factor that is allowing the left foot and left lateral calf to heal. He has external compression garments at home 04/14/20-All of his wounds are progressing well, the left forefoot is practically closed, left ischium appears to be about the same, right toe webspace is also smaller. The left lateral leg is about the same, continue using Hydrofera Blue to this, silver alginate to the ischium, Iodoflex to the toe space on the right 6/7; most of his wounds outside of the left buttock are doing well. The area on the left lateral calf and left dorsal foot are smaller. The area on the right foot in between the first and second toe webspace is barely visible although he still says there is some drainage here is the only reason I did not heal this out. ooUnfortunately the area on the left buttock almost looks like he has [name] skin tear from tape. He has open wound and then [name] large flap of skin that we are trying to get adherence over an area just next to the remaining wound 6/21; 2 week follow-up. I believe is been here for nurse visits. Miraculously the area between his first and second toes on the left dorsal foot is closed over. Still open on the right first second web space. The left lateral calf has 2 open areas. Distally this is more superficial. The proximal area had [name] little more depth and required debridement of adherent necrotic material. His buttock wound is actually larger we have been using silver alginate here 6/28; the patient's area on the left foot remains closed. Still open wet area between the first and second toes on the right and also extending into the plantar aspect. We have been using silver alginate in this location. He has 2 areas on the left lower leg part of his original long wounds which I think are better. We have been using Hydrofera Blue here. Hydrofera Blue to the left  buttock which is stable 7/12; left foot remains closed. Left ankle is closed. May be [name] small area between his right first and second toes the only truly open area is on the left buttock. We have been using Hydrofera Blue here 7/19; patient arrives with marked deterioration especially in the left foot and ankle. We did not put him in [name] compression wrap on the left last week in fact he wore his juxta lite stockings on either side although he does not have an underlying stocking. He has [name] reopening on the left dorsal foot, left lateral ankle and [name] new area on the right dorsal ankle. More worrisome is the degree of erythema on the left foot extending on  the lateral foot into the lateral lower leg on the left 7/26; the patient had erythema and drainage from the lateral left ankle last week. Culture of this grew MRSA resistant to doxycycline and clindamycin which are the 2 antibiotics we usually use with this patient who has multiple antibiotic allergies including linezolid, trimethoprim sulfamethoxazole. I had give him an empiric doxycycline and he comes in the area certainly looks somewhat better although it is blotchy in his lower leg. He has not been systemically unwell. He has had areas on the left dorsal foot which is [name] reopening, chronic wounds on the left lateral ankle. Both of these I think are secondary to chronic venous insufficiency. The area between his first and second toes is closed as far as I can tell. He had [name] new wrap injury on the right dorsal ankle last week. Finally he has an area on the left buttock. We have been using silver alginate to everything except the left buttock we are using Hydrofera Blue 06/30/20-Patient returns at 1 week, has been given [name] sample dose pack of NUZYRA which is [name] tetracycline derivative [omadacycline], patient has completed those, we have been using silver alginate to almost all the wounds except the left ischium where we are using Hydrofera Blue all of them look  better 8/16; since I last saw the patient he has been doing well. The area on the left buttock, left lateral ankle and left foot are all closed today. He has completed the [place] I gave him last time and tolerated this well. He still has open areas on the right dorsal ankle and in the right first second toe area which we are using silver alginate. 8/23; we put him in his bilateral external compression stockings last week as he did not have anything open on either leg except for concerning area between the right first and second toe. He comes in today with an area on the left dorsal foot slightly more proximal than the original wound, the left lateral foot but this is actually [name] continuation of the area he had on the left lateral ankle from last time. As well he is opened up on the left buttock again. 8/30; comes in today with things looking [name] lot better. The area on the left lower ankle has closed down as has the left foot but with eschar in both areas. The area on the dorsal right ankle is also epithelialized. Very little remaining of the left buttock wound. We have been using silver alginate on all wound areas 9/13; the area in the first second toe webspace on the right has fully epithelialized. He still has some vulnerable epithelium on the right and the ankle and the dorsal foot. He notes weeping. He is using his juxta lite stocking. On the left again the left dorsal foot is closed left lateral ankle is closed. We went to the juxta lite stocking here as well. ooStill vulnerable in the left buttock although only 2 small open areas remain here 9/27; 2-week follow-up. We did not look at his left leg but the patient says everything is closed. He is [name] bit disturbed by the amount of edema in his left foot he is using juxta lite stockings but asking about over the toes stockings which would be 30/40, will talk to him next time. According to him there is no open wound on either the left foot or the left  ankle/calf He has an open area on the dorsal right calf which I initially  point [name] wrap injury. He has superficial remaining wound on the left ischial tuberosity been using silver alginate although he says this sticks to the wound 10/5; we gave him 2-week follow-up but he called yesterday expressing some concerns about his right foot right ankle and the left buttock. He came in early. There is still no open areas on the left leg and that still in his juxta lite stocking 10/11; he only has 1 small area on the left buttock that remains measuring millimeters 1 mm. Still has the same irritated skin in this area. We recommended zinc oxide when this eventually closes and pressure relief is meticulously is he can do this. He still has an area on the dorsal part of his right first through third toes which is [name] bit irritated and still open and on the dorsal ankle near the crease of the ankle. We have been using silver alginate and using his own stocking. He has nothing open on the left leg or foot 10/25; 2-week follow-up. Not nearly as good on the left buttock as I was hoping. For open areas with 5 looking threatened small. He has the erythematous irritated chronic skin in this area. oo1 area on the right dorsal ankle. He reports this area bleeds easily ooRight dorsal foot just proximal to the base of his toes ooWe have been using silver alginate. 11/8; 2-week follow-up. Left buttock is about the same although I do not think the wounds are in the same location we have been using silver alginate. I have asked him to use zinc oxide on the skin around the wounds. ooHe still has [name] small area on the right dorsal ankle he reports this bleeds easily ooRight dorsal foot just proximal to the base of the toes does not have anything open although the skin is very dry and scaly ooHe has [name] new opening on the nailbed of the left great toe. Nothing on the left ankle 11/29; 3-week follow-up. Left buttock has 2 open  areas. And washing of these wounds today started bleeding easily. Suggesting very friable tissue. We have been using silver alginate. Right dorsal ankle which I thought was initially [name] wrap injury we have been using silver alginate. Nothing open between the toes that I can see. He states the area on the left dorsal toe nailbed healed after the last visit in 2 or 3 days 12/13; 3-week follow-up. His left buttock now has 3 open areas but the original 2 areas are smaller using polymen here. Surrounding skin looks better. The right dorsal ankle is closed. He has [name] small opening on the right dorsal foot at the level of the third toe. In general the skin looks better here. He is wearing his juxta lite stocking on the left leg says there is nothing open 11/24/2020; 3 weeks follow-up. His left buttock still has the 3 open areas. We have been using polymen but due to lack of response he changed to [facility] area. Surrounding skin is dry erythematous and irritated looking. There is no evidence of infection either bacterial or fungal however there is loss of surface epithelium ooHe still has very dry skin in his foot causing irritation and erythema on the dorsal part of his toes. This is not responded to prolonged courses of antifungal simply looks dry and irritated 1/24; left buttock area still looks about the same he was unable to find the triad ointment that we had suggested. The area on the right lower leg just above the dorsal  ankle has reopened and the areas on the right foot between the first second and second third toes and scaling on the bottom of the foot has been about the same for quite some time now. been using silver alginate to all wound areas 2/7; left buttock wound looked quite good although not much smaller in terms of surface area surrounding skin looks better. Only [name] few dry flaking areas on the right foot in between the first and second toes the skin generally looks better here [ammonium  lactate]. Finally the area on the right dorsal ankle is closed 2/21; ooThere is no open area on the right foot even between the right first and second toe. Skin around this area dorsally and plantar aspects look better. ooHe has [name] reopening of the area on the right ankle just above the crease of the ankle dorsally. I continue to think that this is probably friction from spasms may be even this time with his stocking under the compression stockings. ooWounds on his left buttock look about the same there [name] couple of areas that have reopened. He has [name] total square area of loss of epithelialization. This does not look like infection it looks like [name] contact dermatitis but I just cannot determine to what 3/14; there is nothing on the right foot between the first and second toes this was carefully inspected under illumination. Some chronic irritation on the dorsal part of his foot from toes 1-3 at the base. Nothing really open here substantially. Still has an area on the right foot/ankle that is actually larger and hyper granulated. His buttock area on the left is just about closed however he has chronic inflammation with loss of the surface epithelial layer 3/28; 2-week follow-up. In clinic today with [name] new wound on the left anterior mid tibia. Says this happened about 2 weeks ago. He is not really sure how wonders about the spasticity of his legs at night whether that could have caused this other than that he does not have [name] good idea. He has been using topical antibiotics and silver alginate. The area on his right dorsal ankle seems somewhat better. ooFinally everything on his left buttock is closed. 4/11; 2-week follow-up. All of his wounds are better except for the area over the ischium and left buttock which have opened up widely again. At least part of this is covered in necrotic fibrinous material another part had rolled nonviable skin. The area on the right ankle, left anterior mid tibia are  both [name] lot better. He had no open wounds on either foot including the areas between the first and second toes Objective Constitutional Sitting or standing Blood Pressure is within target range for patient.. Pulse regular and within target range for patient.Marland Kitchen Respirations regular, non-labored and within target range.. Temperature is normal and within the target range for the patient.Marland Kitchen Appears in no distress. Vitals Time Taken: 8:06 AM, Height: 70 in, Weight: 216 lbs, BMI: 31, Temperature: 98.3 F, Pulse: 130 bpm, Respiratory Rate: 18 breaths/min, Blood Pressure: 130/74 mmHg. General Notes: Wound exam; left buttocks. Last time there was no particular open area here he now he has quite [name] bit of skin breakdown which looks like shear stress injury. Part of the skin is rolling completely nonviable. Also part of it had tight fibrinous debris. I used [name] #5 curette to remove the rolled skin and fibrinous debris on the surface hemostasis with [name] pressure dressing. Integumentary (Hair, Skin) Wound #41R status is Open. Original cause of wound  was Gradually Appeared. The date acquired was: 03/16/2020. The wound has been in treatment 50 weeks. The wound is located on the Left Ischium. The wound measures 6cm length x 7cm width x 0.1cm depth; 32.987cm^2 area and 3.299cm^3 volume. There is Fat Layer (Subcutaneous Tissue) exposed. There is no tunneling or undermining noted. There is [name] medium amount of serosanguineous drainage noted. The wound margin is distinct with the outline attached to the wound base. There is medium (34-66%) red granulation within the wound bed. There is [name] medium (34-66%) amount of necrotic tissue within the wound bed including Eschar and Adherent Slough. Wound #50 status is Open. Original cause of wound was Gradually Appeared. The date acquired was: 01/12/2021. The wound has been in treatment 7 weeks. The wound is located on the Right,Dorsal Ankle. The wound measures 1.5cm length x 1.2cm width x  0.1cm depth; 1.414cm^2 area and 0.141cm^3 volume. There is Fat Layer (Subcutaneous Tissue) exposed. There is no tunneling or undermining noted. There is [name] medium amount of serosanguineous drainage noted. The wound margin is distinct with the outline attached to the wound base. There is large (67-100%) red, friable granulation within the wound bed. There is no necrotic tissue within the wound bed. Wound #51 status is Open. Original cause of wound was Trauma. The date acquired was: 01/26/2021. The wound has been in treatment 2 weeks. The wound is located on the Left,Anterior Lower Leg. The wound measures 0.3cm length x 0.4cm width x 0.1cm depth; 0.094cm^2 area and 0.009cm^3 volume. There is Fat Layer (Subcutaneous Tissue) exposed. There is no tunneling or undermining noted. There is [name] medium amount of serosanguineous drainage noted. The wound margin is well defined and not attached to the wound base. There is large (67-100%) red, friable granulation within the wound bed. There is no necrotic tissue within the wound bed. 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 There was [name] Excisional Skin/Subcutaneous Tissue Debridement with [name] total area of 16 sq cm performed by Ricard Dillon., MD. With the following instrument(s): Curette to remove Viable and Non-Viable tissue/material. Material removed includes Subcutaneous Tissue and Slough and. No specimens were taken. [name] time out was conducted at 08:34, prior to the start of the procedure. [name] Moderate amount of bleeding was controlled with Pressure. The procedure was tolerated well with [name] pain level of 0 throughout and [name] pain  level of 0 following the procedure. Post Debridement Measurements: 6cm length x 7cm width x 0.1cm depth; 3.299cm^3 volume. Post debridement Stage noted as Category/Stage II. Character of Wound/Ulcer Post Debridement is improved. Post procedure Diagnosis Wound #41R: Same as Pre-Procedure Plan Follow-up Appointments: Return Appointment in 2 weeks. Bathing/ Shower/ Hygiene: May shower and wash wound with soap and water. - on days that dressing is changed Edema Control - Lymphedema / SCD / Other: Elevate legs to the level of the heart or above for 30 minutes daily and/or when sitting, [name] frequency of: - throughout the day Compression stocking or Garment 30-40 mm/Hg pressure to: - Juxtalite to both legs daily Off-Loading: Roho cushion for wheelchair Turn and reposition every 2 hours WOUND #41R: - Ischium Wound Laterality: Left Cleanser: Soap and Water Every Other Day/30 Days Discharge Instructions: May shower and  wash wound with dial antibacterial soap and water prior to dressing change. Prim Dressing: Hydrofera Blue Ready Foam, 4x5 in Every Other Day/30 Days ary Discharge Instructions: Apply to wound bed as instructed Secondary Dressing: ComfortFoam Border, 4x4 in (silicone border) Every Other Day/30 Days Discharge Instructions: Apply over primary dressing as directed. WOUND #50: - Ankle Wound Laterality: Dorsal, Right Cleanser: Wound Cleanser Every Other Day/30 Days Discharge Instructions: Cleanse the wound with wound cleanser prior to applying [name] clean dressing using gauze sponges, not tissue or cotton balls. Cleanser: Soap and Water Every Other Day/30 Days Discharge Instructions: May shower and wash wound with dial antibacterial soap and water prior to dressing change. Prim Dressing: Hydrofera Blue Ready Foam, 2.5 x2.5 in Every Other Day/30 Days ary Discharge Instructions: Apply to wound bed as instructed Secondary Dressing: ComfortFoam Border, 4x4 in (silicone border) Every Other Day/30  Days Discharge Instructions: Apply over primary dressing as directed. WOUND #51: - Lower Leg Wound Laterality: Left, Anterior Cleanser: Wound Cleanser Every Other Day/30 Days Discharge Instructions: Cleanse the wound with wound cleanser prior to applying [name] clean dressing using gauze sponges, not tissue or cotton balls. Cleanser: Soap and Water Every Other Day/30 Days Discharge Instructions: May shower and wash wound with dial antibacterial soap and water prior to dressing change. Prim Dressing: Hydrofera Blue Ready Foam, 2.5 x2.5 in Every Other Day/30 Days ary Discharge Instructions: Apply to wound bed as instructed Secondary Dressing: ComfortFoam Border, 4x4 in (silicone border) Every Other Day/30 Days Discharge Instructions: Apply over primary dressing as directed. 1. I am 1 use Hydrofera Blue on the 3 remaining wound areas including his buttock left anterior tibia which is just about closed, and the right ankle which is not any smaller but looks very healthy. 2. Optimistically there was nothing on his feet and nothing between the right first and second toe or the left first and second toe which is [name] big improvement. Electronic Signature(s) Signed: 03/02/2021 4:45:43 PM By: Linton Ham MD Entered By: Linton Ham on 03/02/2021 09:04:00 -------------------------------------------------------------------------------- SuperBill Details Patient Name: Date of Service: Gabor, [name] [name] 03/02/2021 Medical Record Number: [id number] Patient Account Number: [id number] Date of Birth/Sex: Treating RN: [birth year] (35 y.o. [name] [name] Primary Care Provider: Cos Cob, Almond Other Clinician: Referring Provider: Treating Provider/Extender: [name]: 269 Diagnosis Coding ICD-10 Codes Code Description I87.332 Chronic venous hypertension (idiopathic) with ulcer and inflammation of left lower extremity L97.511 Non-pressure chronic ulcer of other part of  right foot limited to breakdown of 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, 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, complete Facility Procedures CPT4 Code: [id number] Description: 32951 - DEB SUBQ TISSUE 20 SQ CM/< ICD-10 Diagnosis Description L89.323 Pressure ulcer of left buttock, stage 3 Modifier: Quantity: 1 Physician Procedures Electronic Signature(s) Signed: 03/02/2021 4:45:43 PM By: Linton Ham MD Entered By: Linton Ham on 03/02/2021 09:04:38

## 2021-03-04 NOTE — Progress Notes (Signed)
Sandridge, KAMORI BARBIER ([id number]) Visit Report for 03/02/2021 Arrival Information Details Patient Name: Date of Service: Lagace, [name] 03/02/2021 8:00 A M Medical Record Number: [id number] Patient Account Number: [id number] Date of Birth/Sex: Treating RN: [birth year] (35 y.o. [name] Primary Care Reyne Falconi: [place], [place] Other Clinician: Referring Bari Handshoe: Treating Matson Welch/Extender: [name]: 66 Visit Information History Since Last Visit Added or deleted any medications: No Patient Arrived: Wheel Chair Any new allergies or adverse reactions: No Arrival Time: 08:05 Had a fall or experienced change in No Transfer Assistance: None activities of daily living that may affect Patient Identification Verified: Yes risk of falls: Secondary Verification Process Completed: Yes Signs or symptoms of abuse/neglect since last visito No Patient Requires Transmission-Based Precautions: No Hospitalized since last visit: No Patient Has Alerts: Yes Implantable device outside of the clinic excluding No Patient Alerts: R ABI = 1.0 cellular tissue based products placed in the center L ABI = 1.1 since last visit: Has Dressing in Place as Prescribed: Yes Has Compression in Place as Prescribed: Yes Pain Present Now: No Electronic Signature(s) Signed: 03/02/2021 5:21:09 PM By: Lorrin Jackson Entered By: Lorrin Jackson on 03/02/2021 08:06:30 -------------------------------------------------------------------------------- Encounter Discharge Information Details Patient Name: Date of Service: Bredeson, [name] 03/02/2021 8:00 A M Medical Record Number: [id number] Patient Account Number: [id number] Date of Birth/Sex: Treating RN: [birth year] (35 y.o. [name] Primary Care Adrinne Sze: [place], [place] Other Clinician: Referring Rudransh Bellanca: Treating Gagandeep Pettet/Extender: [name]: 269 Encounter Discharge Information Items  Post Procedure Vitals Discharge Condition: Stable Temperature (F): 98.3 Ambulatory Status: Wheelchair Pulse (bpm): 130 Discharge Destination: Home Respiratory Rate (breaths/min): 18 Transportation: Private Auto Blood Pressure (mmHg): 130/74 Schedule Follow-up Appointment: Yes Clinical Summary of Care: Provided on 03/02/2021 Form Type Recipient Paper Patient Patient Electronic Signature(s) Signed: 03/02/2021 9:23:12 AM By: Lorrin Jackson Entered By: Lorrin Jackson on 03/02/2021 09:23:11 -------------------------------------------------------------------------------- Lower Extremity Assessment Details Patient Name: Date of Service: Jeudy, A Mulberry 03/02/2021 8:00 A M Medical Record Number: [id number] Patient Account Number: [id number] Date of Birth/Sex: Treating RN: [birth year] (35 y.o. [name] Primary Care [name] [name]: [place], [place] Other Clinician: Referring Lorin Hauck: Treating Mariem Skolnick/Extender: Dutch Gray, [name]: 269 Edema Assessment Assessed: [Left: Yes] [Right: Yes] Edema: [Left: Yes] [Right: Yes] Calf Left: Right: Point of Measurement: 33 cm From Medial Instep 34.7 cm 35 cm Ankle Left: Right: Point of Measurement: 10 cm From Medial Instep 24.5 cm 26.4 cm Vascular Assessment Pulses: Dorsalis Pedis Palpable: [Left:Yes] [Right:Yes] Electronic Signature(s) Signed: 03/02/2021 5:21:09 PM By: Lorrin Jackson Entered By: Lorrin Jackson on 03/02/2021 08:11:21 -------------------------------------------------------------------------------- Multi Wound Chart Details Patient Name: Date of Service: Hobdy, A Grabill 03/02/2021 8:00 A M Medical Record Number: [id number] Patient Account Number: [id number] Date of Birth/Sex: Treating RN: 03/19/1986 (35 y.o. [name] Primary Care Aerielle Stoklosa: [name], [name] Other Clinician: Referring Elyanna Wallick: Treating Keaghan Staton/Extender: Dutch Gray, [name]: 269 Vital  Signs Height(in): 70 Pulse(bpm): 130 Weight(lbs): 216 Blood Pressure(mmHg): 130/74 Body Mass Index(BMI): 31 Temperature(F): 98.3 Respiratory Rate(breaths/min): 18 Photos: [41R:No Photos Left Ischium] [50:No Photos Right, Dorsal Ankle] [51:No Photos Left, Anterior Lower Leg] Wound Location: [41R:Gradually Appeared] [50:Gradually Appeared] [51:Trauma] Wounding Event: [41R:Pressure Ulcer] [50:Venous Leg Ulcer] [51:Trauma, Other] Primary Etiology: [41R:Sleep Apnea, Hypertension, Paraplegia Sleep Apnea, Hypertension, Paraplegia Sleep Apnea, Hypertension, Paraplegia] Comorbid History: [41R:03/16/2020] [50:01/12/2021] [51:01/26/2021] Date Acquired: [41R:50] [50:7] [51:2] Weeks of Treatment: [41R:Open] [50:Open] [51:Open] Wound Status: [41R:Yes] [50:No] [51:No] Wound Recurrence: [41R:Yes] [50:No] [51:No] Clustered Wound: [  41R:2] [50:N/A] [51:N/A] Clustered Quantity: [41R:6x7x0.1] [50:1.5x1.2x0.1] [51:0.3x0.4x0.1] Measurements L x W x D (cm) [41R:32.987] [50:1.414] [51:0.094] A (cm) : rea [41R:3.299] [50:0.141] [51:0.009] Volume (cm) : [41R:-1726.50%] [50:25.00%] [51:71.50%] % Reduction in Area: [41R:-1722.70%] [50:62.60%] [51:72.70%] % Reduction in Volume: [41R:Category/Stage II] [50:Full Thickness Without Exposed] [51:Full Thickness Without Exposed] Classification: [41R:Medium] [50:Support Structures Medium] [51:Support Structures Medium] Exudate A mount: [41R:Serosanguineous] [50:Serosanguineous] [51:Serosanguineous] Exudate Type: [41R:red, brown] [50:red, brown] [51:red, brown] Exudate Color: [41R:Distinct, outline attached] [50:Distinct, outline attached] [51:Well defined, not attached] Wound Margin: [41R:Medium (34-66%)] [50:Large (67-100%)] [51:Large (67-100%)] Granulation A mount: [41R:Red] [50:Red, Friable] [51:Red, Friable] Granulation Quality: [41R:Medium (34-66%)] [50:None Present (0%)] [51:None Present (0%)] Necrotic A mount: [41R:Eschar, Adherent Slough] [50:N/A]  [51:N/A] Necrotic Tissue: [41R:Fat Layer (Subcutaneous Tissue): Yes Fat Layer (Subcutaneous Tissue): Yes Fat Layer (Subcutaneous Tissue): Yes] Exposed Structures: [41R:Fascia: No Tendon: No Muscle: No Joint: No Bone: No Large (67-100%)] [50:Fascia: No Tendon: No Muscle: No Joint: No Bone: No Small (1-33%)] [51:Fascia: No Tendon: No Muscle: No Joint: No Bone: No Medium (34-66%)] Epithelialization: [41R:Debridement - Excisional] [50:N/A] [51:N/A] Debridement: Pre-procedure Verification/Time Out 08:34 [50:N/A] [51:N/A] Taken: [41R:Subcutaneous, Slough] [50:N/A] [51:N/A] Tissue Debrided: [41R:Skin/Subcutaneous Tissue] [50:N/A] [51:N/A] Level: [41R:16] [50:N/A] [51:N/A] Debridement A (sq cm): [41R:rea Curette] [50:N/A] [51:N/A] Instrument: [41R:Moderate] [50:N/A] [51:N/A] Bleeding: [41R:Pressure] [50:N/A] [51:N/A] Hemostasis A chieved: [41R:0] [50:N/A] [51:N/A] Procedural Pain: [41R:0] [50:N/A] [51:N/A] Post Procedural Pain: [41R:Procedure was tolerated well] [50:N/A] [51:N/A] Debridement Treatment Response: [41R:6x7x0.1] [50:N/A] [51:N/A] Post Debridement Measurements L x W x D (cm) [41R:3.299] [50:N/A] [51:N/A] Post Debridement Volume: (cm) [41R:Category/Stage II] [50:N/A] [51:N/A] Post Debridement Stage: [41R:Debridement] [50:N/A] [51:N/A] Treatment Notes Electronic Signature(s) Signed: 03/02/2021 4:45:43 PM By: Linton Ham MD Signed: 03/02/2021 5:30:45 PM By: Levan Hurst RN, BSN Entered By: Linton Ham on 03/02/2021 08:56:46 -------------------------------------------------------------------------------- Multi-Disciplinary Care Plan Details Patient Name: Date of Service: Dust, [name] 03/02/2021 8:00 A M Medical Record Number: [id number] Patient Account Number: [id number] Date of Birth/Sex: Treating RN: 04-06-1986 (35 y.o. [name] Primary Care Oryon Gary: [name], [name] Other Clinician: Referring Ree Alcalde: Treating Ritesh Opara/Extender: [name]: Golinda reviewed with physician Active Inactive Wound/Skin Impairment Nursing Diagnoses: Impaired tissue integrity Knowledge deficit related to ulceration/compromised skin integrity Goals: Patient/caregiver will verbalize understanding of skin care regimen Date Initiated: 01/05/2016 Target Resolution Date: 03/13/2021 Goal Status: Active Ulcer/skin breakdown will have a volume reduction of 30% by week 4 Date Initiated: 01/05/2016 Date Inactivated: 12/22/2017 Target Resolution Date: 01/19/2018 Unmet Reason: complex wounds, Goal Status: Unmet infection Interventions: Assess patient/caregiver ability to obtain necessary supplies Assess ulceration(s) every visit Provide education on ulcer and skin care Notes: 02/02/21: Complex Care, ongoing. Electronic Signature(s) Signed: 03/02/2021 5:30:45 PM By: Levan Hurst RN, BSN Entered By: Levan Hurst on 03/02/2021 08:01:17 -------------------------------------------------------------------------------- Pain Assessment Details Patient Name: Date of Service: Lundy, [name] 03/02/2021 8:00 A M Medical Record Number: [id number] Patient Account Number: [id number] Date of Birth/Sex: Treating RN: [birth year] (35 y.o. [name] Primary Care Kallen Mccrystal: [place], [place] Other Clinician: Referring Jeffry Vogelsang: Treating Izzac Rockett/Extender: [name]: 269 Active Problems Location of Pain Severity and Description of Pain Patient Has Paino No Site Locations Pain Management and Medication Current Pain Management: Electronic Signature(s) Signed: 03/02/2021 5:21:09 PM By: Lorrin Jackson Entered By: Lorrin Jackson on 03/02/2021 08:08:02 -------------------------------------------------------------------------------- Patient/Caregiver Education Details Patient Name: Date of Service: Dettinger, [name] 4/11/2022andnbsp8:00 A M Medical Record Number:  [id number] Patient Account Number: [id number] Date of Birth/Gender: Treating RN: 1986-05-30 (35 y.o. [name]  Primary Care Physician: Janine Limbo Other Clinician: Referring Physician: Treating Physician/Extender: [name]: 65 Education Assessment Education Provided To: Patient Education Topics Provided Wound/Skin Impairment: Methods: Explain/Verbal Responses: State content correctly Electronic Signature(s) Signed: 03/02/2021 5:30:45 PM By: Levan Hurst RN, BSN Entered By: Levan Hurst on 03/02/2021 08:01:47 -------------------------------------------------------------------------------- Wound Assessment Details Patient Name: Date of Service: Bruins, [name] 03/02/2021 8:00 A M Medical Record Number: [id number] Patient Account Number: [id number] Date of Birth/Sex: Treating RN: 04-23-1986 (35 y.o. [name] Primary Care Natashia Roseman: [name], [name] Other Clinician: Referring Mart Colpitts: Treating Kahmari Koller/Extender: [name]: 269 Wound Status Wound Number: 41R Primary Etiology: Pressure Ulcer Wound Location: Left Ischium Wound Status: Open Wounding Event: Gradually Appeared Comorbid History: Sleep Apnea, Hypertension, Paraplegia Date Acquired: 03/16/2020 Weeks Of Treatment: 50 Clustered Wound: Yes Photos Wound Measurements Length: (cm) 6 Width: (cm) 7 Depth: (cm) 0.1 Clustered Quantity: 2 Area: (cm) 32.987 Volume: (cm) 3.299 % Reduction in Area: -1726.5% % Reduction in Volume: -1722.7% Epithelialization: Large (67-100%) Tunneling: No Undermining: No Wound Description Classification: Category/Stage II Wound Margin: Distinct, outline attached Exudate Amount: Medium Exudate Type: Serosanguineous Exudate Color: red, brown Foul Odor After Cleansing: No Slough/Fibrino No Wound Bed Granulation Amount: Medium (34-66%) Exposed Structure Granulation Quality: Red Fascia  Exposed: No Necrotic Amount: Medium (34-66%) Fat Layer (Subcutaneous Tissue) Exposed: Yes Necrotic Quality: Eschar, Adherent Slough Tendon Exposed: No Muscle Exposed: No Joint Exposed: No Bone Exposed: No Treatment Notes Wound #41R (Ischium) Wound Laterality: Left Cleanser Soap and Water Discharge Instruction: May shower and wash wound with dial antibacterial soap and water prior to dressing change. Peri-Wound Care Topical Primary Dressing Hydrofera Blue Ready Foam, 4x5 in Discharge Instruction: Apply to wound bed as instructed Secondary Dressing ComfortFoam Border, 4x4 in (silicone border) Discharge Instruction: Apply over primary dressing as directed. Secured With Compression Wrap Compression Stockings Environmental education officer) Signed: 03/02/2021 5:44:40 PM By: Rhae Hammock RN Signed: 03/04/2021 8:29:36 AM By: Sandre Kitty Entered By: Sandre Kitty on 03/02/2021 16:59:07 -------------------------------------------------------------------------------- Wound Assessment Details Patient Name: Date of Service: Keatley, [name] 03/02/2021 8:00 A M Medical Record Number: [id number] Patient Account Number: [id number] Date of Birth/Sex: Treating RN: 10-20-1986 (35 y.o. [name] Primary Care Shamaria Kavan: [name], [name] Other Clinician: Referring Lasheika Ortloff: Treating Gemini Bunte/Extender: [name]: 269 Wound Status Wound Number: 50 Primary Etiology: Venous Leg Ulcer Wound Location: Right, Dorsal Ankle Wound Status: Open Wounding Event: Gradually Appeared Comorbid History: Sleep Apnea, Hypertension, Paraplegia Date Acquired: 01/12/2021 Weeks Of Treatment: 7 Clustered Wound: No Photos Wound Measurements Length: (cm) 1.5 Width: (cm) 1.2 Depth: (cm) 0.1 Area: (cm) 1.414 Volume: (cm) 0.141 % Reduction in Area: 25% % Reduction in Volume: 62.6% Epithelialization: Small (1-33%) Tunneling: No Undermining: No Wound  Description Classification: Full Thickness Without Exposed Support Structures Wound Margin: Distinct, outline attached Exudate Amount: Medium Exudate Type: Serosanguineous Exudate Color: red, brown Foul Odor After Cleansing: No Slough/Fibrino No Wound Bed Granulation Amount: Large (67-100%) Exposed Structure Granulation Quality: Red, Friable Fascia Exposed: No Necrotic Amount: None Present (0%) Fat Layer (Subcutaneous Tissue) Exposed: Yes Tendon Exposed: No Muscle Exposed: No Joint Exposed: No Bone Exposed: No Treatment Notes Wound #50 (Ankle) Wound Laterality: Dorsal, Right Cleanser Wound Cleanser Discharge Instruction: Cleanse the wound with wound cleanser prior to applying a clean dressing using gauze sponges, not tissue or cotton balls. Soap and Water Discharge Instruction: May shower and wash wound with dial antibacterial soap and water prior to dressing change. Peri-Wound  Care Topical Primary Dressing Hydrofera Blue Ready Foam, 2.5 x2.5 in Discharge Instruction: Apply to wound bed as instructed Secondary Dressing ComfortFoam Border, 4x4 in (silicone border) Discharge Instruction: Apply over primary dressing as directed. Secured With Compression Wrap Compression Stockings Environmental education officer) Signed: 03/02/2021 5:44:40 PM By: Rhae Hammock RN Signed: 03/04/2021 8:29:36 AM By: Sandre Kitty Entered By: Sandre Kitty on 03/02/2021 16:56:17 -------------------------------------------------------------------------------- Wound Assessment Details Patient Name: Date of Service: Tesfaye, [name] 03/02/2021 8:00 A M Medical Record Number: [id number] Patient Account Number: [id number] Date of Birth/Sex: Treating RN: [birth year] (35 y.o. [name] Primary Care Draken Farrior: [name], [name] Other Clinician: Referring Kelii Chittum: Treating Brentin Shin/Extender: [name]: 269 Wound Status Wound Number: 51 Primary  Etiology: Trauma, Other Wound Location: Left, Anterior Lower Leg Wound Status: Open Wounding Event: Trauma Comorbid History: Sleep Apnea, Hypertension, Paraplegia Date Acquired: 01/26/2021 Weeks Of Treatment: 2 Clustered Wound: No Photos Wound Measurements Length: (cm) 0.3 Width: (cm) 0.4 Depth: (cm) 0.1 Area: (cm) 0.094 Volume: (cm) 0.009 % Reduction in Area: 71.5% % Reduction in Volume: 72.7% Epithelialization: Medium (34-66%) Tunneling: No Undermining: No Wound Description Classification: Full Thickness Without Exposed Support Structures Wound Margin: Well defined, not attached Exudate Amount: Medium Exudate Type: Serosanguineous Exudate Color: red, brown Foul Odor After Cleansing: No Slough/Fibrino No Wound Bed Granulation Amount: Large (67-100%) Exposed Structure Granulation Quality: Red, Friable Fascia Exposed: No Necrotic Amount: None Present (0%) Fat Layer (Subcutaneous Tissue) Exposed: Yes Tendon Exposed: No Muscle Exposed: No Joint Exposed: No Bone Exposed: No Treatment Notes Wound #51 (Lower Leg) Wound Laterality: Left, Anterior Cleanser Wound Cleanser Discharge Instruction: Cleanse the wound with wound cleanser prior to applying a clean dressing using gauze sponges, not tissue or cotton balls. Soap and Water Discharge Instruction: May shower and wash wound with dial antibacterial soap and water prior to dressing change. Peri-Wound Care Topical Primary Dressing Hydrofera Blue Ready Foam, 2.5 x2.5 in Discharge Instruction: Apply to wound bed as instructed Secondary Dressing ComfortFoam Border, 4x4 in (silicone border) Discharge Instruction: Apply over primary dressing as directed. Secured With Compression Wrap Compression Stockings Environmental education officer) Signed: 03/02/2021 5:21:09 PM By: Lorrin Jackson Signed: 03/04/2021 8:29:36 AM By: Sandre Kitty Entered By: Sandre Kitty on 03/02/2021  16:57:08 -------------------------------------------------------------------------------- Russell Details Patient Name: Date of Service: Wix, [name] 03/02/2021 8:00 A M Medical Record Number: [id number] Patient Account Number: [id number] Date of Birth/Sex: Treating RN: January 27, 1986 (35 y.o. [name] Primary Care Juliann Olesky: [place], [place] Other Clinician: Referring Shaylynne Lunt: Treating Najib Colmenares/Extender: [name]: 269 Vital Signs Time Taken: 08:06 Temperature (F): 98.3 Height (in): 70 Pulse (bpm): 130 Weight (lbs): 216 Respiratory Rate (breaths/min): 18 Body Mass Index (BMI): 31 Blood Pressure (mmHg): 130/74 Reference Range: 80 - 120 mg / dl Electronic Signature(s) Signed: 03/02/2021 5:21:09 PM By: Lorrin Jackson Entered By: Lorrin Jackson on 03/02/2021 08:07:54

## 2021-03-16 ENCOUNTER — Other Ambulatory Visit: Payer: Self-pay

## 2021-03-16 ENCOUNTER — Encounter (HOSPITAL_BASED_OUTPATIENT_CLINIC_OR_DEPARTMENT_OTHER): Payer: 59 | Admitting: Internal Medicine

## 2021-03-16 DIAGNOSIS — L97511 Non-pressure chronic ulcer of other part of right foot limited to breakdown of skin: Secondary | ICD-10-CM | POA: Diagnosis not present

## 2021-03-16 DIAGNOSIS — L89323 Pressure ulcer of left buttock, stage 3: Secondary | ICD-10-CM

## 2021-03-16 DIAGNOSIS — I87332 Chronic venous hypertension (idiopathic) with ulcer and inflammation of left lower extremity: Secondary | ICD-10-CM | POA: Diagnosis not present

## 2021-03-16 NOTE — Progress Notes (Signed)
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 smaller but very gradual and very dry surface using Hydrofera Blue 06/16/17 requires weekly debridements now on the left lateral leg although this continues to contract. I changed to silver collagen last week because of dryness of the wound bed. Using Iodoflex to the areas on his first and second toes/web space bilaterally 06/24/17; patient with history of paraplegia also chronic venous insufficiency with lymphedema. Has a very difficult wound on the left lateral leg. This has been gradually reducing in terms of with but comes in with a very dry adherent surface. High switch to silver collagen a week or so ago with hydrogel to keep the area moist. This is been refractory to multiple dressing attempts. He also has areas in his first and second toes bilaterally in the anterior and posterior web space. I had been using Iodoflex here after a prolonged course of silver alginate with ketoconazole was ineffective [question tinea pedis] 07/14/17; patient arrives today with a very difficult adherent material over his left lateral lower leg wound.  He also has surrounding erythema and poorly controlled  edema. He was switched his Santyl last visit which the nurses are applying once during his doctor visit and once on a nurse visit. He was also reduced to 2 layer compression I'm not exactly sure of the issue here. 07/21/17; better surface today after 1 week of Iodoflex. Significant cellulitis that we treated last week also better. [Doxycycline] 07/28/17 better surface today with now 2 weeks of Iodoflex. Significant cellulitis treated with doxycycline. He has now completed the doxycycline and he is back to his usual degree of chronic venous inflammation/stasis dermatitis. He reminds me he has had ablations surgery here 08/04/17; continued improvement with Iodoflex to the left lateral leg wound in terms of the surface of the wound although the dimensions are better. 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 Problematic areas on the plantar aspect of the first second toe web space on the left and the dorsal aspect of the first second toe web space on the right. At one point I felt these were probably related to chronic fungal infections in treated him aggressively for this although we have not made any improvement here. 08/11/17; left lateral leg. Surface continues to improve with the Iodoflex although we are not seeing much improvement in overall wound dimensions. Areas on his plantar left foot and right foot show no improvement. In fact the right foot looks somewhat worse 08/18/17; left lateral leg. We changed to [facility] Blue last week after a prolonged course of Iodoflex which helps get the surface better. It appears that the wound with is improved. Continue with difficult areas on the left dorsal first second and plantar first second on the right 09/01/17; patient arrives in clinic today having had a temperature of 103 yesterday. He was seen in the ER and [facility]. The patient was concerned he could have cellulitis again in the right  leg however they diagnosed him with a UTI and he is now on Keflex. He has a history of cellulitis which is been recurrent and difficult but this is been in the left leg, in the past 5 use doxycycline. He does in and out catheterizations at home which are risk factors for UTI 09/08/17; patient will be completing his Keflex this weekend. The erythema on the left leg is considerably better. He has a new wound today on the medial part of the right leg small superficial almost looks like a skin tear. He has worsening of the area on the right dorsal first and second toe. His major area on the left lateral leg is better. Using Hydrofera Blue on all areas 09/15/17; gradual reduction in width on the long wound in the left lateral leg. No debridement required. He also has wounds on the plantar aspect of his left first second toe web space and on the dorsal aspect of the right first second toe web space. 09/22/17; there continues to be very gradual improvements in the dimensions of the left lateral leg wound. He hasn't round erythematous spot with might be pressure on his wheelchair. There is no evidence obviously of infection no purulence no warmth He has a dry scaled area on the plantar aspect of the left first second toe Improved area on the dorsal right first second toe. 09/29/17; left lateral leg wound continues to improve in dimensions mostly with an is still a fairly long but increasingly narrow wound. He has a dry scaled area on the plantar aspect  of his left first second toe web space Increasingly concerning area on the dorsal right first second toe. In fact I am concerned today about possible cellulitis around this wound. The areas extending up his second toe and although there is deformities here almost appears to abut on the nailbed. 10/06/17; left lateral leg wound continues to make very gradual progress. Tissue culture I did from the right first second toe dorsal foot last time grew MRSA and  enterococcus which was vancomycin sensitive. This was not sensitive to clindamycin or doxycycline. He is allergic to Zyvox and sulfa we have therefore arrange for him to have dalvance infusion tomorrow. He is had this in the past and tolerated it well 10/20/17; left lateral leg wound continues to make decent progress. This is certainly reduced in terms of with there is advancing epithelialization.The cellulitis in the right foot looks better although he still has a deep wound in the dorsal aspect of the first second toe web space. Plantar left first toe web space on the left I think is making some progress 10/27/17; left lateral leg wound continues to make decent progress. Advancing epithelialization.using Hydrofera Blue The right first second toe web space wound is better-looking using silver alginate Improvement in the left plantar first second toe web space. Again using silver alginate 11/03/17 left lateral leg wound continues to make decent progress albeit slowly. Using [facility] The right per second toe web space continues to be a very problematic looking punched out wound. I obtained a piece of tissue for deep culture I did extensively treated this for fungus. It is difficult to imagine that this is a pressure area as the patient states other than going outside he doesn't really wear shoes at home The left plantar first second toe web space looked fairly senescent. Necrotic edges. This required debridement change to [facility] Blue to all wound areas 11/10/17; left lateral leg wound continues to contract. Using Hydrofera Blue On the right dorsal first second toe web space dorsally. Culture I did of this area last week grew MRSA there is not an easy oral option in this patient was multiple antibiotic allergies or intolerances. This was only a rare culture isolate I'm therefore going to use Bactroban under silver alginate On the left plantar first second toe web space. Debridement is required  here. This is also unchanged 11/17/17; left lateral leg wound continues to contract using Hydrofera Blue this is no longer the major issue. The major concern here is the right first second toe web space. He now has an open area going from dorsally to the plantar aspect. There is now wound on the inner lateral part of the first toe. Not a very viable surface on this. There is erythema spreading medially into the forefoot. No major change in the left first second toe plantar wound 11/24/17; left lateral leg wound continues to contract using Hydrofera Blue. Nice improvement today The right first second toe web space all of this looks a lot less angry than last week. I have given him clindamycin and topical Bactroban for MRSA and terbinafine for the possibility of underlining tinea pedis that I could not control with ketoconazole. Looks somewhat better The area on the plantar left first second toe web space is weeping with dried debris around the wound 12/01/17; left lateral leg wound continues to contract he Hydrofera Blue. It is becoming thinner in terms of with nevertheless it is making good improvement. The right first second toe web space looks less angry  but still a large necrotic-looking wounds starting on the plantar aspect of the right foot extending between the toes and now extensively on the base of the right second toe. I gave him clindamycin and topical Bactroban for MRSA anterior benefiting for the possibility of underlying tinea pedis. Not looking better today The area on the left first/second toe looks better. Debrided of necrotic debris 12/05/17* the patient was worked in urgently today because over the weekend he found blood on his incontinence bad when he woke up. He was found to have an ulcer by his wife who does most of his wound care. He came in today for [place] to look at this. He has not had a history of wounds in his buttocks in spite of his paraplegia. 12/08/17; seen in follow-up today  at his usual appointment. He was seen earlier this week and found to have a new wound on his buttock. We also follow him for wounds on the left lateral leg, left first second toe web space and right first second toe web space 12/15/17; we have been using Hydrofera Blue to the left lateral leg which has improved. The right first second toe web space has also improved. Left first second toe web space plantar aspect looks stable. The left buttock has worsened using Santyl. Apparently the buttock has drainage 12/22/17; we have been using Hydrofera Blue to the left lateral leg which continues to improve now 2 small wounds separated by normal skin. He tells [place] he had a fever up to 100 yesterday he is prone to UTIs but has not noted anything different. He does in and out catheterizations. The area between the first and second toes today does not look good necrotic surface covered with what looks to be purulent drainage and erythema extending into the third toe. I had gotten this to something that I thought look better last time however it is not look good today. He also has a necrotic surface over the buttock wound which is expanded. I thought there might be infection under here so I removed a lot of the surface with a #5 curet though nothing look like it really needed culturing. He is been using Santyl to this area 12/27/17; his original wound on the left lateral leg continues to improve using Hydrofera Blue. I gave him samples of Baxdella although he was unable to take them out of fear for an allergic reaction ["lump in his throat"].the culture I did of the purulent drainage from his second toe last week showed both enterococcus and a set Enterobacter I was also concerned about the erythema on the bottom of his foot although paradoxically although this looks somewhat better today. 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 We've been using silver alginate here. They came up today that he sleeps in a recliner, I'm not sure why but I asked him to stop this 01/03/18; his original wound we've been using Hydrofera Blue is now separated into 2 areas. Ulcer on his left buttock is better he is off the recliner and sleeping in bed Finally both wound areas between his first and second toes also looks some better 01/10/18; his original wound on the left lateral leg is now separated into 2 wounds we've been using Hydrofera Blue Ulcer on his left buttock has some drainage. There is a small probing site going into muscle layer superiorly.using silver alginate -He arrives today with a  deep tissue injury on the left heel The wound on the dorsal aspect of his first second toe on the left looks a lot betterusing silver alginate ketoconazole The area on the first second toe web space on the right also looks a lot bette 01/17/18; his original wound on the left lateral leg continues to progress using Hydrofera Blue Ulcer on his left buttock also is smaller surface healthier except for a small probing site going into the muscle layer superiorly. 2.4 cm of tunneling in this area DTI on his left heel we have only been offloading. Looks better than last week no threatened open no evidence of infection the wound on the dorsal aspect of the first second toe on the left continues to look like it's regressing we have only been using silver alginate and terbinafine orally The area in the first second toe web space on the right also looks to be a lot better using silver alginate and terbinafine I think this was prompted by tinea pedis 01/31/18; the patient was hospitalized in [place] last week apparently for a complicated UTI. He was discharged on cefepime he does in and out catheterizations. In the hospital he was discovered M I don't mild elevation of AST and ALT and the terbinafine was stopped.predictably the pressure ulcer  on s his buttock looks betterusing silver alginate. The area on the left lateral leg also is better using Hydrofera Blue. The area between the first and second toes on the left better. First and second toes on the right still substantial but better. Finally the DTI on the left heel has held together and looks like it's resolving 02/07/18-he is here in follow-up evaluation for multiple ulcerations. He has new injury to the lateral aspect of the last issue a pressure ulcer, he states this is from adhesive removal trauma. He states he has tried multiple adhesive products with no success. All other ulcers appear stable. The left heel DTI is resolving. We will continue with same treatment plan and follow-up next week. 02/14/18; follow-up for multiple areas. He has a new area last week on the lateral aspect of his pressure ulcer more over the posterior trochanter. The original pressure ulcer looks quite stable has healthy granulation. 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 Almost fully epithelialized on the original superior area using Hydrofera Blue DTI on the left heel has peeled off this week to reveal a small superficial wound under denuded skin and subcutaneous tissue Both areas between the first and second toes look better including nothing open on the left 02/21/18; The patient's wounds on his left ischial tuberosity and posterior left greater trochanter actually looked better. He has a large area of irritation around the area which I think is contact dermatitis. I am doubtful that this is fungal His original wound on the left lateral calf continues to improve we have been using Hydrofera Blue There is no open area in the left first second toe web space although there is a lot of thick callus The DTI on the left heel required debridement today of necrotic surface eschar and subcutaneous tissue  using silver alginate Finally the area on the right first second toe webspace continues to contract using silver alginate and ketoconazole 02/28/18 Left ischial tuberosity wounds look better using silver alginate. Original wound on the left calf only has one small open area left using Hydrofera Blue DTI on the left heel  required debridement mostly removing skin from around this wound surface. Using silver alginate The areas on the right first/second toe web space using silver alginate and ketoconazole 03/08/18 on evaluation today patient appears to be doing decently well as best I can tell in regard to his wounds. This is the first time that I have seen him as he generally is followed by Dr. Dellia Nims. With that being said none of his wounds appear to be infected he does have an area where there is some skin covering what appears to be a new wound on the left dorsal surface of his great toe. This is right at the nail bed. With that being said I do believe that debrided away some of the excess skin can be of benefit in this regard. Otherwise he has been tolerating the dressing changes without complication. 03/14/18; patient arrives today with the multiplicity of wounds that we are following. He has not been systemically unwell Original wound on the left lateral calf now only has 2 small open areas we've been using Hydrofera Blue which should continue The deep tissue injury on the left heel requires debridement today. We've been using silver alginate The left first second toe and the right first second toe are both are reminiscence what I think was tinea pedis. Apparently some of the callus Surface between the toes was removed last week when it started draining. Purulent drainage coming from the wound on the ischial tuberosity on the left. 03/21/18-He is here in follow-up evaluation for multiple wounds. There is improvement, he is currently taking doxycycline, culture obtained last week grew tetracycline  sensitive MRSA. He tolerated debridement. The only change to last week's recommendations is to discontinue antifungal cream between toes. He will follow-up next week 03/28/18; following up for multiple wounds;Concern this week is streaking redness and swelling in the right foot. He is going to need antibiotics for this. 03/31/18; follow-up for right foot cellulitis. Streaking redness and swelling in the right foot on 03/28/18. 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 I put him on clindamycin 300 mg every 6 and brought him in for a quick check. He has an open wound between his first and second toes on the right foot as a potential source. 04/04/18; Right foot cellulitis is resolving he is completing clindamycin. This is truly good news Left lateral calf wound which is initial wound only has one small open area inferiorly this is close to healing out. He has compression stockings. We will use Hydrofera Blue right down to the epithelialization of this Nonviable surface on the left heel which was initially pressure with a DTI. We've been using Hydrofera Blue. I'm going to switch this back to silver alginate Left first second toe/tinea pedis this looks better using silver alginate Right first second toe tinea pedis using silver alginate Large pressure ulcers on theLeft ischial tuberosity. Small wound here Looks better. I am uncertain about the surface over the large wound. Using silver alginate 04/11/18; Cellulitis in the right foot is resolved Left lateral calf wound which was his original wounds still has 2 tiny open areas remaining this is just about closed Nonviable surface on the left heel is better but still requires debridement Left first second toe/tinea pedis still open using silver alginate Right first second toe wound tinea pedis I asked him to go back to using ketoconazole and silver alginate Large pressure ulcers on the left ischial tuberosity this shear injury here is  resolved. Wound is smaller. No  evidence of infection using silver alginate 04/18/18; Patient arrives with an intense area of cellulitis in the right mid lower calf extending into the right heel area. Bright red and warm. Smaller area on the left anterior leg. He has a significant history of MRSA. He will definitely need antibioticsdoxycycline He now has 2 open areas on the left ischial tuberosity the original large wound and now a satellite area which I think was above his initial satellite areas. Not a wonderful surface on this satellite area surrounding erythema which looks like pressure related. His left lateral calf wound again his original wound is just about closed Left heel pressure injury still requiring debridement Left first second toe looks a lot better using silver alginate Right first second toe also using silver alginate and ketoconazole cream also looks better 04/20/18; the patient was worked in early today out of concerns with his cellulitis on the right leg. I had started him on doxycycline. This was 2 days ago. His wife was concerned about the swelling in the area. Also concerned about the left buttock. He has not been systemically unwell no fever chills. No nausea vomiting or diarrhea 04/25/18; the patient's left buttock wound is continued to deteriorate he is using Hydrofera Blue. He is still completing clindamycin for the cellulitis on the right leg although all of this looks better. 05/02/18 Left buttock wound still with a lot of drainage and a very tightly adherent fibrinous necrotic surface. He has a deeper area superiorly The left lateral calf wound is still closed DTI wound on the left heel necrotic surface especially the circumference using Iodoflex Areas between his left first second toe and right first second toe both look better. Dorsally and the right first second toe he had a necrotic surface although at smaller. In using silver alginate and ketoconazole. I did a  culture last week which was a deep tissue culture of the reminiscence of the open wound on the right first second toe dorsally. This grew a few Acinetobacter and a few methicillin-resistant staph aureus. Nevertheless the area actually this week looked better. I didn't feel the need to specifically address this at least in terms of systemic antibiotics. 05/09/18; wounds are measuring larger more drainage per our intake. We are using Santyl covered with alginate on the large superficial buttock wounds, Iodosorb on the left heel, ketoconazole and silver alginate to the dorsal first and second toes bilaterally. 05/16/18; The area on his left buttock better in some aspects although the area superiorly over the ischial tuberosity required an extensive debridement.using Santyl Left heel appears stable. Using Iodoflex The areas between his first and second toes are not bad however there is spreading erythema up the dorsal aspect of his left foot this looks like cellulitis again. He is insensate the erythema is really very brilliant.o Erysipelas He went to see an allergist days ago because he was itching part of this he had lab work done. This showed a white count of 15.1 with 70% neutrophils. Hemoglobin of 11.4 and a platelet count of 659,000. Last white count we had in Epic was a 2-1/2 years ago which was 25.9 but he was ill at the time. He was able to show me some lab work that was done by his primary physician the pattern is about the same. 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 But prompted me to go ahead and do x-rays of both feet and the pelvis rule out osteomyelitis.  He also had a comprehensive metabolic panel this was reasonably normal his albumin was 3.7 liver function tests BUN/creatinine all normal 05/23/18; x-rays of both his feet from last week were negative for underlying pulmonary abnormality. The x-ray of his pelvis however showed mild irregularity  in the left ischial which may represent some early osteomyelitis. The wound in the left ischial continues to get deeper clearly now exposed muscle. Each week necrotic surface material over this area. Whereas the rest of the wounds do not look so bad. The left ischial wound we have been using Santyl and calcium alginate T the left heel surface necrotic debris using Iodoflex o The left lateral leg is still healed Areas on the left dorsal foot and the right dorsal foot are about the same. There is some inflammation on the left which might represent contact dermatitis, fungal dermatitis I am doubtful cellulitis although this looks better than last week 05/30/18; CT scan done at Hospital did not show any osteomyelitis or abscess. Suggested the possibility of underlying cellulitis although I don't see a lot of evidence of this at the bedside The wound itself on the left buttock/upper thigh actually looks somewhat better. No debridement Left heel also looks better no debridement continue Iodoflex Both dorsal first second toe spaces appear better using Lotrisone. Left still required debridement 06/06/18; Intake reported some purulent looking drainage from the left gluteal wound. Using Santyl and calcium alginate Left heel looks better although still a nonviable surface requiring debridement The left dorsal foot first/second webspace actually expanding and somewhat deeper. I may consider doing a shave biopsy of this area Right dorsal foot first/second webspace appears stable to improved. Using Lotrisone and silver alginate to both these areas 06/13/18 Left gluteal surface looks better. Now separated in the 2 wounds. No debridement required. Still drainage. We'll continue silver alginate Left heel continues to look better with Iodoflex continue this for at least another week Of his dorsal foot wounds the area on the left still has some depth although it looks better than last week. We've been using Lotrisone  and silver alginate 06/20/18 Left gluteal continues to look better healthy tissue Left heel continues to look better healthy granulation wound is smaller. He is using Iodoflex and his long as this continues continue the Iodoflex Dorsal right foot looks better unfortunately dorsal left foot does not. There is swelling and erythema of his forefoot. He had minor trauma to this several days ago but doesn't think this was enough to have caused any tissue injury. Foot looks like cellulitis, we have had this problem before 06/27/18 on evaluation today patient appears to be doing a little worse in regard to his foot ulcer. 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 Both of which would really be his only options for treating this infection. In the past he has been given and effusion of Orbactiv. This is done very well for him in the past again it's one time dosing IV antibiotic therapy. Subsequently I do believe this is something we're gonna need to see about doing at this point in time. Currently his other wounds seem to be doing somewhat better in my pinion I'm pretty happy in that regard. 07/03/18 on evaluation today patient's wounds actually appear to be doing fairly well. He has been tolerating the dressing changes without complication. All in all he seems to be  showing signs of improvement. In regard to the antibiotics he has been dealing with infectious disease since I saw him last week as far as getting this scheduled. In the end [facility]'s [facility] this coming Friday. In the meantime he has been continuing to perform the dressing changes in such as previous. There does not appear to be any evidence of infection worsengin at this time. 07/10/18; Since I last saw this man 2 weeks ago things have actually improved.  IV antibiotics of resulted in less forefoot erythema although there is still some present. He is not systemically unwell Left buttock wounds 2 now have no depth there is increased epithelialization Using silver alginate Left heel still requires debridement using Iodoflex Left dorsal foot still with a sizable wound about the size of a border but healthy granulation Right dorsal foot still with a slitlike area using silver alginate 07/18/18; the patient's cellulitis in the left foot is improved in fact I think it is on its way to resolving. Left buttock wounds 2 both look better although the larger one has hypertension granulation we've been using silver alginate Left heel has some thick circumferential redundant skin over the wound edge which will need to be removed today we've been using Iodoflex Left dorsal foot is still a sizable wound required debridement using silver alginate The right dorsal foot is just about closed only a small open area remains here 07/25/18; left foot cellulitis is resolved Left buttock wounds 2 both look better. Hyper-granulation on the major area Left heel as some debris over the surface but otherwise looks a healthier wound. Using silver collagen Right dorsal foot is just about closed 07/31/18; arrives with our intake nurse worried about purulent drainage from the buttock. We had hyper-granulation here last week His buttock wounds 2 continue to look better Left heel some debris over the surface but measuring smaller. Right dorsal foot unfortunately has openings between the toes Left foot superficial wound looks less aggravated. 08/07/18 Buttock wounds continue to look better although some of her granulation and the larger medial wound. silver alginate Left heel continues to look a lot better.silver collagen Left foot superficial wound looks less stable. Requires debridement. He has a new wound superficial area on the foot on the lateral dorsal foot. Right foot looks  better using silver alginate without Lotrisone 08/14/2018; patient was in the ER last week diagnosed with a UTI. He is now on Cefpodoxime and Macrodantin. Buttock wounds continued to be smaller. Using silver alginate Left heel continues to look better using silver collagen Left foot superficial wound looks as though it is improving Right dorsal foot area is just about healed. 08/21/2018; patient is completed his antibiotics for his UTI. He has 2 open areas on the buttocks. There is still not closed although the surface looks satisfactory. Using silver alginate Left heel continues to improve using silver collagen The bilateral dorsal foot areas which are at the base of his first and second toes/possible tinea pedis are actually stable on the left but worse on the right. The area on the left required debridement of necrotic surface. After debridement I obtained a specimen for PCR culture. The right dorsal foot which is been just about healed last week is now reopened 08/28/2018; culture done on the left dorsal foot showed coag negative staph both staph epidermidis and Lugdunensis. I think this is worthwhile initiating systemic treatment. I will use doxycycline given his long list  of allergies. The area on the left heel slightly improved but still requiring debridement. The large wound on the buttock is just about closed whereas the smaller one is larger. Using silver alginate in this area 09/04/2018; patient is completing his doxycycline for the left foot although this continues to be a very difficult wound area with very adherent necrotic debris. We are using silver alginate to all his wounds right foot left foot and the small wounds on his buttock, silver collagen on the left heel. 09/11/2018; once again this patient has intense erythema and swelling of the left forefoot. Lesser degrees of erythema in the right foot. He has a long list of allergies and intolerances. I will reinstitute doxycycline. 2  small areas on the left buttock are all the left of his major stage III pressure ulcer. Using silver alginate Left heel also looks better using silver collagen Unfortunately both the areas on his feet look worse. The area on the left first second webspace is now gone through to the plantar part of his foot. The area on the left foot anteriorly is irritated with erythema and swelling in the forefoot. 09/25/2018 His wound on the left plantar heel looks better. Using silver collagen The area on the left buttock 2 small remnant areas. One is closed one is still open. Using silver alginate The areas between both his first and second toes look worse. This in spite of long-standing antifungal therapy with ketoconazole and silver alginate which should have antifungal activity He has small areas around his original wound on the left calf one is on the bottom of the original scar tissue and one superiorly both of these are small and superficial but again given wound history in this site this is worrisome 10/02/2018 Left plantar heel continues to gradually contract using silver collagen Left buttock wound is unchanged using silver alginate The areas on his dorsal feet between his first and second toes bilaterally look about the same. I prescribed clindamycin ointment to see if we can address chronic staph colonization and also the underlying possibility of erythrasma The left lateral lower extremity wound is actually on the lateral part of his ankle. Small open area here. We have been using silver alginate 10/09/2018; Left plantar heel continues to look healthy and contract. No debridement is required Left buttock slightly smaller with a tape injury wound just below which was new this week Dorsal feet somewhat improved I have been using clindamycin Left lateral looks lower extremity the actual open area looks worse although a lot of this is epithelialized. I am going to change to silver collagen today He  has a lot more swelling in the right leg although this is not pitting not red and not particularly warm there is a lot of spasm in the right leg usually indicative of people with paralysis of some underlying discomfort. We have reviewed his vascular status from 2017 he had a left greater saphenous vein ablation. I wonder about referring him back to vascular surgery if the area on the left leg continues to deteriorate. 10/16/2018 in today for follow-up and management of multiple lower extremity ulcers. His left Buttock wound is much lower smaller and almost closed completely. The wound to the left ankle has began to reopen with Epithelialization and some adherent slough. He has multiple new areas to the left foot and leg. The left dorsal foot without much improvement. Wound present between left great webspace and 2nd toe. Erythema and edema present right leg. Right LE ultrasound  obtained on 10/10/18 was negative for DVT . 10/23/2018; Left buttock is closed over. Still dry macerated skin but there is no open wound. I suspect this is chronic pressure/moisture Left lateral calf is quite a bit worse than when I saw this last. There is clearly drainage here he has macerated skin into the left plantar heel. We will change the primary dressing to alginate Left dorsal foot has some improvement in overall wound area. Still using clindamycin and silver alginate Right dorsal foot about the same as the left using clindamycin and silver alginate The erythema in the right leg has resolved. He is DVT rule out was negative Left heel pressure area required debridement although the wound is smaller and the surface is health 10/26/2018 The patient came back in for his nurse check today predominantly because of the drainage coming out of the left lateral leg with a recent reopening of his original wound on the left lateral calf. He comes in today with a large amount of surrounding erythema around the wound extending from  the calf into the ankle and even in the area on the dorsal foot. He is not systemically unwell. He is not febrile. Nevertheless this looks like cellulitis. We have been using silver alginate to the area. I changed him to a regular visit and I am going to prescribe him doxycycline. The rationale here is a long list of medication intolerances and a history of MRSA. I did not see anything that I thought would provide a valuable culture 10/30/2018 Follow-up from his appointment 4 days ago with really an extensive area of cellulitis in the left calf left lateral ankle and left dorsal foot. I put him on doxycycline. He has a long list of medication allergies which are true allergy reactions. 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 In any case he is a lot better today. The erythema especially in the anterior and lateral left calf is better. He still has left ankle erythema. He also is complaining about increasing edema in the right leg we have only been using Kerlix Coban and he has been doing the wraps at home. Finally he has a spotty rash on the medial part of his upper left calf which looks like folliculitis or perhaps wrap occlusion type injury. Small superficial macules not pustules 11/06/18 patient arrives today with again a considerable degree of erythema around the wound on the left lateral calf extending into the dorsal ankle and dorsal foot. This is a lot worse than when I saw this last week. He is on doxycycline really with not a lot of improvement. He has not been systemically unwell Wounds on the; left heel actually looks improved. Original area on the left foot and proximity to the first and second toes looks about the same. He has superficial areas on the dorsal foot, anterior calf and then the reopening of his original wound on the left lateral calf which looks about the same The only area he has on the right is the dorsal  webspace first and second which is smaller. He has a large area of dry erythematous skin on the left buttock small open area here. 11/13/2018; the patient arrives in much better condition. The erythema around the wound on the left lateral calf is a lot better. Not sure whether this was the clindamycin or the TCA and ketoconazole or just in the improvement in edema control [stasis dermatitis]. In any case this  is a lot better. The area on the left heel is very small and just about resolved using silver collagen we have been using silver alginate to the areas on his dorsal feet 11/20/2018; his wounds include the left lateral calf, left heel, dorsal aspects of both feet just proximal to the first second webspace. He is stable to slightly improved. I did not think any changes to his dressings were going to be necessary 11/27/2018 he has a reopening on the left buttock which is surrounded by what looks like tinea or perhaps some other form of dermatitis. The area on the left dorsal foot has some erythema around it I have marked this area but I am not sure whether this is cellulitis or not. Left heel is not closed. Left calf the reopening is really slightly longer and probably worse 1/13; in general things look better and smaller except for the left dorsal foot. Area on the left heel is just about closed, left buttock looks better only a small wound remains in the skin looks better [using Lotrisone] 1/20; the area on the left heel only has a few remaining open areas here. Left lateral calf about the same in terms of size, left dorsal foot slightly larger right lateral foot still not closed. The area on the left buttock has no open wound and the surrounding skin looks a lot better 1/27; the area on the left heel is closed. Left lateral calf better but still requiring extensive debridements. The area on his left buttock is closed. He still has the open areas on the left dorsal foot which is slightly smaller in  the right foot which is slightly expanded. We have been using Iodoflex on these areas as well 2/3; left heel is closed. Left lateral calf still requiring debridement using Iodoflex there is no open area on his left buttock however he has dry scaly skin over a large area of this. Not really responding well to the Lotrisone. Finally the areas on his dorsal feet at the level of the first second webspace are slightly smaller on the right and about the same on the left. Both of these vigorously debrided with Anasept and gauze 2/10; left heel remains closed he has dry erythematous skin over the left buttock but there is no open wound here. Left lateral leg has come in and with. Still requiring debridement we have been using Iodoflex here. Finally the area on the left dorsal foot and right dorsal foot are really about the same extremely dry callused fissured areas. He does not yet have a dermatology appointment 2/17; left heel remains closed. He has a new open area on the left buttock. The area on the left lateral calf is bigger longer and still covered in necrotic debris. No major change in his foot areas bilaterally. I am awaiting for a dermatologist to look on this. We have been using ketoconazole I do not know that this is been doing any good at all. 2/24; left heel remains closed. The left buttock wound that was new reopening last week looks better. The left lateral calf appears better also although still requires debridement. The major area on his foot is the left first second also requiring debridement. We have been putting Prisma on all wounds. I do not believe that the ketoconazole has done too much good for his feet. He will use Lotrisone I am going to give him a 2-week course of terbinafine. We still do not have a dermatology appointment 3/2 left heel  remains closed however there is skin over bone in this area I pointed this out to him today. The left buttock wound is epithelialized but still does  not look completely stable. The area on the left leg required debridement were using silver collagen here. With regards to his feet we changed to Lotrisone last week and silver alginate. 3/9; left heel remains closed. Left buttock remains closed. The area on the right foot is essentially closed. The left foot remains unchanged. Slightly smaller on the left lateral calf. Using silver collagen to both of these areas 3/16-Left heel remains closed. Area on right foot is closed. Left lateral calf above the lateral malleolus open wound requiring debridement with easy bleeding. Left dorsal wound proximal to first toe also debrided. Left ischial area open new. Patient has been using Prisma with wrapping every 3 days. Dermatology appointment is apparently tomorrow.Patient has completed his terbinafine 2-week course with some apparent improvement according to him, there is still flaking and dry skin in his foot on the left 3/23; area on the right foot is reopened. The area on the left anterior foot is about the same still a very necrotic adherent surface. He still has the area on the left leg and reopening is on the left buttock. He apparently saw dermatology although I do not have a note. According to the patient who is usually fairly well informed they did not have any good ideas. Put him on oral terbinafine which she is been on before. 3/30; using silver collagen to all wounds. Apparently his dermatologist put him on doxycycline and rifampin presumably some culture grew staph. I do not have this result. He remains on terbinafine although I have used terbinafine on him before 4/6; patient has had a fairly substantial reopening on the right foot between the first and second toes. He is finished his terbinafine and I believe is on doxycycline and rifampin still as prescribed by dermatology. We have been using silver collagen to all his wounds although the patient reports that he thinks silver alginate does  better on the wounds on his buttock. 4/13; the area on his left lateral calf about the same size but it did not require debridement. Left dorsal foot just proximal to the webspace between the first and second toes is about the same. Still nonviable surface. I note some superficial bronze discoloration of the dorsal part of his foot Right dorsal foot just proximal to the first and second toes also looks about the same. I still think there may be the same discoloration I noted above on the left Left buttock wound looks about the same 4/20; left lateral calf appears to be gradually contracting using silver collagen. 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 The left dorsal foot wound is debrided of tightly adherent necrotic debris and really cleans up quite nicely. The right area is worse with expansion. I did not debride this it is now over the base of the second toe The area on his left buttock is smaller no debridement is required using silver collagen 5/4; left calf continues to make good progress. He arrives with erythema around the wounds on his dorsal foot which even extends to the plantar aspect. Very concerning for coexistent infection. He is finished the erythromycin I gave him for possible erythrasma this does not seem to have helped. The area on the left foot is about the same base of the dorsal toes Is area on the buttock looks improved  on the left 5/11; left calf and left buttock continued to make good progress. Left foot is about the same to slightly improved. Major problem is on the right foot. He has not had an x-ray. Deep tissue culture I did last week showed both Enterobacter and E. coli. I did not change the doxycycline I put him on empirically although neither 1 of these were plated to doxycycline. He arrives today with the erythema looking worse on both the dorsal and plantar foot. Macerated skin on the bottom of the foot.  he has not been systemically unwell 5/18-Patient returns at 1 week, left calf wound appears to be making some progress, left buttock wound appears slightly worse than last time, left foot wound looks slightly better, right foot redness is marginally better. X-ray of both feet show no air or evidence of osteomyelitis. Patient is finished his Omnicef and terbinafine. He continues to have macerated skin on the bottom of the left foot as well as right 5/26; left calf wound is better, left buttock wound appears to have multiple small superficial open areas with surrounding macerated skin. X-rays that I did last time showed no evidence of osteomyelitis in either foot. He is finished cefdinir and doxycycline. I do not think that he was on terbinafine. He continues to have a large superficial open area on the right foot anterior dorsal and slightly between the first and second toes. I did send him to dermatology 2 months ago or so wondering about whether they would do a fungal scraping. I do not believe they did but did do a culture. We have been using silver alginate to the toe areas, he has been using antifungals at home topically either ketoconazole or Lotrisone. We are using silver collagen on the left foot, silver alginate on the right, silver collagen on the left lateral leg and silver alginate on the left buttock 6/1; left buttock area is healed. We have the left dorsal foot, left lateral leg and right dorsal foot. We are using silver alginate to the areas on both feet and silver collagen to the area on his left lateral calf 6/8; the left buttock apparently reopened late last week. He is not really sure how this happened. He is tolerating the terbinafine. Using silver alginate to all wounds 6/15; left buttock wound is larger than last week but still superficial. Came in the clinic today with a report of purulence from the left lateral leg I did not identify any infection Both areas on his dorsal feet  appear to be better. He is tolerating the terbinafine. Using silver alginate to all wounds 6/22; left buttock is about the same this week, left calf quite a bit better. His left foot is about the same however he comes in with erythema and warmth in the right forefoot once again. Culture that I gave him in the beginning of May showed Enterobacter and E. coli. I gave him doxycycline and things seem to improve although neither 1 of these organisms was specifically plated. 6/29; left buttock is larger and dry this week. Left lateral calf looks to me to be improved. Left dorsal foot also somewhat improved right foot completely unchanged. The erythema on the right foot is still present. He is completing the Ceftin dinner that I gave him empirically [see discussion above.) 7/6 - All wounds look to be stable and perhaps improved, the left buttock wound is slightly smaller, per patient bleeds easily, completed ceftin, the right foot redness is less, he is on  terbinafine 7/13; left buttock wound about the same perhaps slightly narrower. Area on the left lateral leg continues to narrow. Left dorsal foot slightly smaller right foot about the same. We are using silver alginate on the right foot and Hydrofera Blue to the areas on the left. Unna boot on the left 2 layer compression on the right 7/20; left buttock wound absolutely the same. Area on lateral leg continues to get better. Left dorsal foot require debridement as did the right no major change in the 7/27; left buttock wound the same size necrotic debris over the surface. The area on the lateral leg is closed once again. His left foot looks better right foot about the same although there is some involvement now of the posterior first second toe area. He is still on terbinafine which I have given him for a month, not certain a centimeter major change 06/25/19-All wounds appear to be slightly improved according to report, left buttock wound looks clean, both  foot wounds have minimal to no debris the right dorsal foot has minimal slough. We are using Hydrofera Blue to the left and silver alginate to the right foot and ischial wound. 8/10-Wounds all appear to be around the same, the right forefoot distal part has some redness which was not there before, however the wound looks clean and small. Ischial wound looks about the same with no changes 8/17; his wound on the left lateral calf which was his original chronic venous insufficiency wound remains closed. Since I last saw him the areas on the left dorsal foot right dorsal foot generally appear better but require debridement. The area on his left initial tuberosity appears somewhat larger to me perhaps hyper granulated and bleeds very easily. We have been using Hydrofera Blue to the left dorsal foot and silver alginate to everything else 8/24; left lateral calf remains closed. The areas on his dorsal feet on the webspace of the first and second toes bilaterally both look better. The area on the left buttock which is the pressure ulcer stage II slightly smaller. I change the dressing to Hydrofera Blue to all areas 8/31; left lateral calf remains closed. The area on his dorsal feet bilaterally look better. Using Hydrofera Blue. Still requiring debridement on the left foot. No change in the left buttock pressure ulcers however 9/14; left lateral calf remains closed. Dorsal feet look quite a bit better than 2 weeks ago. Flaking dry skin also a lot better with the ammonium lactate I gave him 2 weeks ago. The area on the left buttock is improved. He states that his Roho cushion developed a leak and he is getting a new one, in the interim he is offloading this vigorously 9/21; left calf remains closed. Left heel which was a possible DTI looks better this week. He had macerated tissue around the left dorsal foot right foot looks satisfactory and improved left buttock wound. I changed his dressings to his feet to  silver alginate bilaterally. Continuing Hydrofera Blue on the left buttock. 9/28 left calf remains closed. Left heel did not develop anything [possible DTI] dry flaking skin on the left dorsal foot. Right foot looks satisfactory. Improved left buttock wound. We are using silver alginate on his feet Hydrofera Blue on the buttock. I have asked him to go back to the Lotrisone on his feet including the wounds and surrounding areas 10/5; left calf remains closed. The areas on the left and right feet about the same. A lot of this is epithelialized however  debris over the remaining open areas. He is using Lotrisone and silver alginate. The area on the left buttock using Hydrofera Blue 10/26. Patient has been out for 3 weeks secondary to Covid concerns. He tested negative but I think his wife tested positive. He comes in today with the left foot substantially worse, right foot about the same. Even more concerning he states that the area on his left buttock closed over but then reopened and is considerably deeper in one aspect than it was before [stage III wound] 11/2; left foot really about the same as last week. Quarter sized wound on the dorsal foot just proximal to the first second toes. Surrounding erythema with areas of denuded epithelium. This is not really much different looking. Did not look like cellulitis this time however. Right foot area about the same.. We have been using silver alginate alginate on his toes Left buttock still substantial irritated skin around the wound which I think looks somewhat better. We have been using Hydrofera Blue here. 11/9; left foot larger than last week and a very necrotic surface. Right foot I think is about the same perhaps slightly smaller. Debris around the circumference also addressed. Unfortunately on the left buttock there is been a decline. Satellite lesions below the major wound distally and now a an additional one posteriorly we have been using Hydrofera  Blue but I think this is a pressure issue 11/16; left foot ulcer dorsally again a very adherent necrotic surface. Right foot is about the same. Not much change in the pressure ulcer on his left buttock. 11/30; left foot ulcer dorsally basically the same as when I saw him 2 weeks ago. Very adherent fibrinous debris on the wound surface. Patient reports a lot of drainage as well. 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 We have been using Iodoflex, patient changed back to alginate because of the drainage. Area on his right dorsal foot really looks benign with a healthier surface certainly a lot better than on the left. Left buttock wounds all improved using Hydrofera Blue 12/7; left dorsal foot again no improvement. Tightly adherent debris. PCR culture I did last week only showed likely skin contaminant. I have gone ahead and done a punch biopsy of this which is about the last thing in terms of investigations I can think to do. He has known venous insufficiency and venous hypertension and this could be the issue here. The area on the right foot is about the same left buttock slightly worse according to our intake nurse secondary to [facility] Blue sticking to the wound 12/14; biopsy of the left foot that I did last time showed changes that could be related to wound healing/chronic stasis dermatitis phenomenon no neoplasm. We have been using silver alginate to both feet. I change the one on the left today to Sorbact and silver alginate to his other 2 wounds 12/28; the patient arrives with the following problems; Major issue is the dorsal left foot which continues to be a larger deeper wound area. Still with a completely nonviable surface Paradoxically the area mirror image on the right on the right dorsal foot appears to be getting better. He had some loss of dry denuded skin from the lower part of his original wound on the left lateral calf. Some of this  area looked a little vulnerable and for this reason we put him in wrap that on this side this week The area on his left buttock is  larger. He still has the erythematous circular area which I think is a combination of pressure, sweat. This does not look like cellulitis or fungal dermatitis 11/26/2019; -Dorsal left foot large open wound with depth. Still debris over the surface. Using Sorbact The area on the dorsal right foot paradoxically has closed over He has a reopening on the left ankle laterally at the base of his original wound that extended up into the calf. This appears clean. The left buttock wound is smaller but with very adherent necrotic debris over the surface. We have been using silver alginate here as well The patient had arterial studies done in 2017. He had biphasic waveforms at the dorsalis pedis and posterior tibial bilaterally. ABI in the left was 1.17. Digit waveforms were dampened. He has slight spasticity in the great toes I do not think a TBI would be possible 1/11; the patient comes in today with a sizable reopening between the first and second toes on the right. This is not exactly in the same location where we have been treating wounds previously. According to our intake nurse this was actually fairly deep but 0.6 cm. The area on the left dorsal foot looks about the same the surface is somewhat cleaner using Sorbact, his MRI is in 2 days. We have not managed yet to get arterial studies. The new reopening on the left lateral calf looks somewhat better using alginate. The left buttock wound is about the same using alginate 1/18; the patient had his ARTERIAL studies which were quite normal. ABI in the right at 1.13 with triphasic/biphasic waveforms on the left ABI 1.06 again with triphasic/biphasic waveforms. It would not have been possible to have done a toe brachial index because of spasticity. We have been using Sorbac to the left foot alginate to the rest of his wounds on  the right foot left lateral calf and left buttock 1/25; arrives in clinic with erythema and swelling of the left forefoot worse over the first MTP area. This extends laterally dorsally and but also posteriorly. Still has an area on the left lateral part of the lower part of his calf wound it is eschared and clearly not closed. Area on the left buttock still with surrounding irritation and erythema. Right foot surface wound dorsally. The area between the right and first and second toes appears better. 2/1; The left foot wound is about the same. Erythema slightly better I gave him a week of doxycycline empirically Right foot wound is more extensive extending between the toes to the plantar surface Left lateral calf really no open surface on the inferior part of his original wound however the entire area still looks vulnerable Absolutely no improvement in the left buttock wound required debridement. 2/8; the left foot is about the same. Erythema is slightly improved I gave him clindamycin last week. Right foot looks better he is using Lotrimin and silver alginate He has a breakdown in the left lateral calf. Denuded epithelium which I have removed Left buttock about the same were using Hydrofera Blue 2/15; left foot is about the same there is less surrounding erythema. Surface still has tightly adherent debris which I have debriding however not making any progress Right foot has a substantial wound on the medial right second toe between the first and second webspace. Still an open area on the left lateral calf distal area. Buttock wound is about the same 2/22; left foot is about the same less surrounding erythema. Surface has adherent debris. Polymen Ag Right  foot area significant wound between the first and second toes. We have been using silver alginate here Left lateral leg polymen Ag at the base of his original venous insufficiency wound Left buttock some improvement here 3/1; Right foot is  deteriorating in the first second toe webspace. Larger and more substantial. We have been using silver alginate. Left dorsal foot about the same markedly adherent surface debris using PolyMem Ag Left lateral calf surface debris using PolyMem AG Left buttock is improved again using PolyMem Ag. He is completing his terbinafine. The erythema in the foot seems better. He has been on this for 2 weeks 3/8; no improvement in any wound area in fact he has a small open area on the dorsal midfoot which is new this week. He has not gotten his foot x-rays yet 3/15; his x-rays were both negative for osteomyelitis of both feet. No major change in any of his wounds on the extremities however his buttock wounds are better. We have been using polymen on the buttocks, left lower leg. Iodoflex on the left foot and silver alginate on the right 3/22; arrives in clinic today with the 2 major issues are the improvement in the left dorsal foot wound which for once actually looks healthy with a nice healthy wound surface without debridement. Using Iodoflex here. Unfortunately on the left lateral calf which is in the distal part of his original wound he came to the clinic here for there was purulent drainage noted some increased breakdown scattered around the original area and a small area proximally. We we are using polymen here will change to silver alginate today. His buttock wound on the left is better and I think the area on the right first second toe webspace is also improved 3/29; left dorsal foot looks better. Using Iodoflex. Left ankle culture from deterioration last time grew E. coli, Enterobacter and Enterococcus. I will give him a course of cefdinir although that will not cover Enterococcus. The area on the right foot in the webspace of the first and second toe lateral first toe looks better. The area on his buttock is about healed Vascular appointment is on April 21. This is to look at his venous system vis--vis  continued breakdown of the wounds on the left including the left lateral leg and left dorsal foot he. He has had previous ablations on this side 4/5; the area between the right first and second toes lateral aspect of the first toe looks better. Dorsal aspect of the left first toe on the left foot also improved. Unfortunately the left lateral lower leg is larger and there is a second satellite wound superiorly. The usual superficial abrasions on the left buttock overall better but certainly not closed 4/12; the area between the right first and second toes is improved. Dorsal aspect of the left foot also slightly smaller with a vibrant healthy looking surface. No real change in the left lateral leg and the left buttock wound is healed He has an unaffordable co-pay for Apligraf. Appointment with vein and vascular with regards to the left leg venous part of the circulation is on 4/21 4/19; we continue to see improvement in all wound areas. Although this is minor. He has his vascular appointment on 4/21. The area on the left buttock has not reopened although right in the center of this area the skin looks somewhat threatened 4/26; the left buttock is unfortunately reopened. In general his left dorsal foot has a healthy surface and looks somewhat smaller although  it was not measured as such. The area between his first and second toe webspace on the right as a small wound against the first toe. The patient saw vascular surgery. The real question I was asking was about the small saphenous vein on the left. He has previously ablated left greater saphenous vein. Nothing further was commented on on the left. Right greater saphenous vein without reflux at the saphenofemoral junction or proximal thigh there was no indication for ablation of the right greater saphenous vein duplex was negative for DVT bilaterally. They did not think there was anything from a vascular surgery point of view that could be offered.  They ABIs within normal limits 5/3; only small open area on the left buttock. The area on the left lateral leg which was his original venous reflux is now 2 wounds both which look clean. We are using Iodoflex on the left dorsal foot which looks healthy and smaller. He is down to a very tiny area between the right first and second toes, using silver alginate 5/10; all of his wounds appear better. We have much better edema control in 4 layer compression on the left. This may be the factor that is allowing the left foot and left lateral calf to heal. He has external compression garments at home 04/14/20-All of his wounds are progressing well, the left forefoot is practically closed, left ischium appears to be about the same, right toe webspace is also smaller. The left lateral leg is about the same, continue using Hydrofera Blue to this, silver alginate to the ischium, Iodoflex to the toe space on the right 6/7; most of his wounds outside of the left buttock are doing well. The area on the left lateral calf and left dorsal foot are smaller. The area on the right foot in between the first and second toe webspace is barely visible although he still says there is some drainage here is the only reason I did not heal this out. Unfortunately the area on the left buttock almost looks like he has a skin tear from tape. He has open wound and then a large flap of skin that we are trying to get adherence over an area just next to the remaining wound 6/21; 2 week follow-up. I believe is been here for nurse visits. Miraculously the area between his first and second toes on the left dorsal foot is closed over. Still open on the right first second web space. The left lateral calf has 2 open areas. Distally this is more superficial. The proximal area had a little more depth and required debridement of adherent necrotic material. His buttock wound is actually larger we have been using silver alginate here 6/28; the  patient's area on the left foot remains closed. Still open wet area between the first and second toes on the right and also extending into the plantar aspect. We have been using silver alginate in this location. He has 2 areas on the left lower leg part of his original long wounds which I think are better. We have been using Hydrofera Blue here. Hydrofera Blue to the left buttock which is stable 7/12; left foot remains closed. Left ankle is closed. May be a small area between his right first and second toes the only truly open area is on the left buttock. We have been using Hydrofera Blue here 7/19; patient arrives with marked deterioration especially in the left foot and ankle. We did not put him in a compression wrap on the  left last week in fact he wore his juxta lite stockings on either side although he does not have an underlying stocking. He has a reopening on the left dorsal foot, left lateral ankle and a new area on the right dorsal ankle. More worrisome is the degree of erythema on the left foot extending on the lateral foot into the lateral lower leg on the left 7/26; the patient had erythema and drainage from the lateral left ankle last week. Culture of this grew MRSA resistant to doxycycline and clindamycin which are the 2 antibiotics we usually use with this patient who has multiple antibiotic allergies including linezolid, trimethoprim sulfamethoxazole. I had give him an empiric doxycycline and he comes in the area certainly looks somewhat better although it is blotchy in his lower leg. He has not been systemically unwell. He has had areas on the left dorsal foot which is a reopening, chronic wounds on the left lateral ankle. Both of these I think are secondary to chronic venous insufficiency. The area between his first and second toes is closed as far as I can tell. He had a new wrap injury on the right dorsal ankle last week. Finally he has an area on the left buttock. We have been using  silver alginate to everything except the left buttock we are using Hydrofera Blue 06/30/20-Patient returns at 1 week, has been given a sample dose pack of NUZYRA which is a tetracycline derivative [omadacycline], patient has completed those, we have been using silver alginate to almost all the wounds except the left ischium where we are using Hydrofera Blue all of them look better 8/16; since I last saw the patient he has been doing well. The area on the left buttock, left lateral ankle and left foot are all closed today. He has completed the [place] I gave him last time and tolerated this well. He still has open areas on the right dorsal ankle and in the right first second toe area which we are using silver alginate. 8/23; we put him in his bilateral external compression stockings last week as he did not have anything open on either leg except for concerning area between the right first and second toe. He comes in today with an area on the left dorsal foot slightly more proximal than the original wound, the left lateral foot but this is actually a continuation of the area he had on the left lateral ankle from last time. As well he is opened up on the left buttock again. 8/30; comes in today with things looking a lot better. The area on the left lower ankle has closed down as has the left foot but with eschar in both areas. The area on the dorsal right ankle is also epithelialized. Very little remaining of the left buttock wound. We have been using silver alginate on all wound areas 9/13; the area in the first second toe webspace on the right has fully epithelialized. He still has some vulnerable epithelium on the right and the ankle and the dorsal foot. He notes weeping. He is using his juxta lite stocking. On the left again the left dorsal foot is closed left lateral ankle is closed. We went to the juxta lite stocking here as well. Still vulnerable in the left buttock although only 2 small open areas  remain here 9/27; 2-week follow-up. We did not look at his left leg but the patient says everything is closed. He is a bit disturbed by the amount of edema in  his left foot he is using juxta lite stockings but asking about over the toes stockings which would be 30/40, will talk to him next time. According to him there is no open wound on either the left foot or the left ankle/calf He has an open area on the dorsal right calf which I initially point a wrap injury. He has superficial remaining wound on the left ischial tuberosity been using silver alginate although he says this sticks to the wound 10/5; we gave him 2-week follow-up but he called yesterday expressing some concerns about his right foot right ankle and the left buttock. He came in early. There is still no open areas on the left leg and that still in his juxta lite stocking 10/11; he only has 1 small area on the left buttock that remains measuring millimeters 1 mm. Still has the same irritated skin in this area. We recommended zinc oxide when this eventually closes and pressure relief is meticulously is he can do this. He still has an area on the dorsal part of his right first through third toes which is a bit irritated and still open and on the dorsal ankle near the crease of the ankle. We have been using silver alginate and using his own stocking. He has nothing open on the left leg or foot 10/25; 2-week follow-up. Not nearly as good on the left buttock as I was hoping. For open areas with 5 looking threatened small. He has the erythematous irritated chronic skin in this area. 1 area on the right dorsal ankle. He reports this area bleeds easily Right dorsal foot just proximal to the base of his toes We have been using silver alginate. 11/8; 2-week follow-up. Left buttock is about the same although I do not think the wounds are in the same location we have been using silver alginate. I have asked him to use zinc oxide on the skin around  the wounds. He still has a small area on the right dorsal ankle he reports this bleeds easily Right dorsal foot just proximal to the base of the toes does not have anything open although the skin is very dry and scaly He has a new opening on the nailbed of the left great toe. Nothing on the left ankle 11/29; 3-week follow-up. Left buttock has 2 open areas. And washing of these wounds today started bleeding easily. Suggesting very friable tissue. We have been using silver alginate. Right dorsal ankle which I thought was initially a wrap injury we have been using silver alginate. Nothing open between the toes that I can see. He states the area on the left dorsal toe nailbed healed after the last visit in 2 or 3 days 12/13; 3-week follow-up. His left buttock now has 3 open areas but the original 2 areas are smaller using polymen here. Surrounding skin looks better. The right dorsal ankle is closed. He has a small opening on the right dorsal foot at the level of the third toe. In general the skin looks better here. He is wearing his juxta lite stocking on the left leg says there is nothing open 11/24/2020; 3 weeks follow-up. His left buttock still has the 3 open areas. We have been using polymen but due to lack of response he changed to [facility] area. Surrounding skin is dry erythematous and irritated looking. There is no evidence of infection either bacterial or fungal however there is loss of surface epithelium He still has very dry skin in his foot causing  irritation and erythema on the dorsal part of his toes. This is not responded to prolonged courses of antifungal simply looks dry and irritated 1/24; left buttock area still looks about the same he was unable to find the triad ointment that we had suggested. The area on the right lower leg just above the dorsal ankle has reopened and the areas on the right foot between the first second and second third toes and scaling on the bottom of the foot  has been about the same for quite some time now. been using silver alginate to all wound areas 2/7; left buttock wound looked quite good although not much smaller in terms of surface area surrounding skin looks better. Only a few dry flaking areas on the right foot in between the first and second toes the skin generally looks better here [ammonium lactate]. Finally the area on the right dorsal ankle is closed 2/21; There is no open area on the right foot even between the right first and second toe. Skin around this area dorsally and plantar aspects look better. He has a reopening of the area on the right ankle just above the crease of the ankle dorsally. I continue to think that this is probably friction from spasms may be even this time with his stocking under the compression stockings. Wounds on his left buttock look about the same there a couple of areas that have reopened. He has a total square area of loss of epithelialization. This does not look like infection it looks like a contact dermatitis but I just cannot determine to what 3/14; there is nothing on the right foot between the first and second toes this was carefully inspected under illumination. Some chronic irritation on the dorsal part of his foot from toes 1-3 at the base. Nothing really open here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 hasn't tense erythema from the medial part of his wound extending over the lower leg. Also erythema in his foot this is roughly in the same distribution as last time. He has baseline chronic venous inflammation however this is a lot worse than the baseline I have learned to accept the on him is baseline inflammation 02/24/17- patient is here for follow-up evaluation. He is tolerating compression therapy. His voicing no complaints or concerns he is here anticipating an Apligraf 03/03/17; he arrives today with an adherent necrotic surface. I don't think this is surface is going to be amenable for Apligraf's. The erythema around his wound and on the left dorsal foot has resolved he is off antibiotics 03/10/17; better-looking surface today. I don't think he can tolerate Apligraf's. He tells me he had a wound VAC after a skin graft years ago to this area and they had difficulty with a seal. The erythema continues to be stable around this some degree of chronic venous inflammation but he also has recurrent cellulitis. We have been using Iodoflex 03/17/17; continued improvement in the surface and may be small changes in dimensions. Using Iodoflex which seems the only thing that will control his surface 03/24/17- He is here for follow up evaluation of his LLE lateral ulceration and ulcer to right dorsal foot/toe space. He is voicing no complaints or concerns, He is tolerating compression wrap. 03/31/17 arrives today with a much healthier looking wound on the left lower extremity. We have been using Iodoflex for a prolonged period of time which has for the first time prepared and adequate looking wound bed although we have not had much in the way of wound dimension improvement. He also  has a small wound between the first and second toe on the right 04/07/17; arrives today with a healthy-looking wound bed and at least the top 50% of this wound appears to be now her. No debridement was required I have changed him to [facility] last week after prolonged Iodoflex. He did not do well with Apligraf's. We've had a re-opening between the first and second toe on the right 04/14/17; arrives today with a healthier looking wound bed contractions and the top 50% of this wound and some on the lesser 50%. Wound bed appears healthy. The area between the first and second toe on the right still remains problematic 04/21/17; continued very gradual improvement. Using [facility] 04/28/17; continued very gradual improvement in the left lateral leg venous insufficiency wound. His periwound erythema is very mild. We have been using Hydrofera Blue. Wound is making progress especially in the superior 50% 05/05/17; he continues to have very gradual improvement in the left lateral venous insufficiency wound. Both in terms with an length rings are improving. I debrided this every 2 weeks with #5 curet and we have been using Hydrofera Blue and again making good progress With regards to the wounds between his right first and second toe which I thought might of been tinea pedis he is not making as much progress very dry scaly skin over the area. Also the area at the base of the left first and second toe in a similar condition 05/12/17; continued gradual improvement in the refractory left lateral venous insufficiency wound on the left. Dimension  smaller. Surface still requiring debridement using Hydrofera Blue 05/19/17; continued gradual improvement in the refractory left lateral venous ulceration. Careful inspection of the wound bed underlying rumination suggested some degree of epithelialization over the surface no debridement indicated. Continue Hydrofera Blue difficult areas between his toes first and third  on the left than first and second on the right. I'm going to change to silver alginate from silver collagen. Continue ketoconazole as I suspect underlying tinea pedis 05/26/17; left lateral leg venous insufficiency wound. We've been using Hydrofera Blue. I believe that there is expanding epithelialization over the surface of the wound albeit not coming from the wound circumference. This is a bit of an odd situation in which the epithelialization seems to be coming from the surface of the wound rather than in the exact circumference. There is still small open areas mostly along the lateral margin of the wound. ooHe has unchanged areas between the left first and second and the right first second toes which I been treating for tenia pedis 06/02/17; left lateral leg venous insufficiency wound. We have been using Hydrofera Blue. Somewhat smaller from the wound circumference. The surface of the wound remains a bit on it almost epithelialized sedation in appearance. I use an open curette today debridement in the surface of all of this especially the edges ooSmall open wounds remaining on the dorsal right first and second toe interspace and the plantar left first second toe and her face on the left 06/09/17; wound on the left lateral leg continues to be smaller but very gradual and very dry surface using Hydrofera Blue 06/16/17 requires weekly debridements now on the left lateral leg although this continues to contract. I changed to silver collagen last week because of dryness of the wound bed. Using Iodoflex to the areas on his first and second toes/web space bilaterally 06/24/17; patient with history of paraplegia also chronic venous insufficiency with lymphedema. Has a very difficult wound on the left lateral leg. This has been gradually reducing in terms of with but comes in with a very dry adherent surface. High switch to silver collagen a week or so ago with hydrogel to keep the area moist. This is been  refractory to multiple dressing attempts. He also has areas in his first and second toes bilaterally in the anterior and posterior web space. I had been using Iodoflex here after a prolonged course of silver alginate with ketoconazole was ineffective [question tinea pedis] 07/14/17; patient arrives today with a very difficult adherent material over his left lateral lower leg wound. He also has surrounding erythema and poorly controlled edema. He was switched his Santyl last visit which the nurses are applying once during his doctor visit and once on a nurse visit. He was also reduced to 2 layer compression I'm not exactly sure of the issue here. 07/21/17; better surface today after 1 week of Iodoflex. Significant cellulitis that we treated last week also better. [Doxycycline] 07/28/17 better surface today with now 2 weeks of Iodoflex. Significant cellulitis treated with doxycycline. He has now completed the doxycycline and he is back to his usual degree of chronic venous inflammation/stasis dermatitis. He reminds me he has had ablations surgery here 08/04/17; continued improvement with Iodoflex to the left lateral leg wound in terms of the surface of the wound although the dimensions are better. 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 Problematic areas on the plantar aspect of the first second toe web space  on the left and the dorsal aspect of the first second toe web space on the right. At one point I felt these were probably related to chronic fungal infections in treated him aggressively for this although we have not made any improvement here. 08/11/17; left lateral leg. Surface continues to improve with the Iodoflex although we are not seeing much improvement in overall wound dimensions. Areas on his plantar left foot and right foot show no improvement. In fact the right foot looks somewhat worse 08/18/17; left lateral leg. We  changed to [facility] Blue last week after a prolonged course of Iodoflex which helps get the surface better. It appears that the wound with is improved. Continue with difficult areas on the left dorsal first second and plantar first second on the right 09/01/17; patient arrives in clinic today having had a temperature of 103 yesterday. He was seen in the ER and [name]. The patient was concerned he could have cellulitis again in the right leg however they diagnosed him with a UTI and he is now on Keflex. He has a history of cellulitis which is been recurrent and difficult but this is been in the left leg, in the past 5 use doxycycline. He does in and out catheterizations at home which are risk factors for UTI 09/08/17; patient will be completing his Keflex this weekend. The erythema on the left leg is considerably better. He has a new wound today on the medial part of the right leg small superficial almost looks like a skin tear. He has worsening of the area on the right dorsal first and second toe. His major area on the left lateral leg is better. Using Hydrofera Blue on all areas 09/15/17; gradual reduction in width on the long wound in the left lateral leg. No debridement required. He also has wounds on the plantar aspect of his left first second toe web space and on the dorsal aspect of the right first second toe web space. 09/22/17; there continues to be very gradual improvements in the dimensions of the left lateral leg wound. He hasn't round erythematous spot with might be pressure on his wheelchair. There is no evidence obviously of infection no purulence no warmth ooHe has a dry scaled area on the plantar aspect of the left first second toe ooImproved area on the dorsal right first second toe. 09/29/17; left lateral leg wound continues to improve in dimensions mostly with an is still a fairly long but increasingly narrow wound. ooHe has a dry scaled area on the plantar aspect of his left  first second toe web space ooIncreasingly concerning area on the dorsal right first second toe. In fact I am concerned today about possible cellulitis around this wound. The areas extending up his second toe and although there is deformities here almost appears to abut on the nailbed. 10/06/17; left lateral leg wound continues to make very gradual progress. Tissue culture I did from the right first second toe dorsal foot last time grew MRSA and enterococcus which was vancomycin sensitive. This was not sensitive to clindamycin or doxycycline. He is allergic to Zyvox and sulfa we have therefore arrange for him to have dalvance infusion tomorrow. He is had this in the past and tolerated it well 10/20/17; left lateral leg wound continues to make decent progress. This is certainly reduced in terms of with there is advancing epithelialization.ooThe cellulitis in the right foot looks better although he still has a deep wound in the dorsal aspect of the first second  toe web space. Plantar left first toe web space on the left I think is making some progress 10/27/17; left lateral leg wound continues to make decent progress. Advancing epithelialization.using Hydrofera Blue ooThe right first second toe web space wound is better-looking using silver alginate ooImprovement in the left plantar first second toe web space. Again using silver alginate 11/03/17 left lateral leg wound continues to make decent progress albeit slowly. Using Hydrofera Blue ooThe right per second toe web space continues to be a very problematic looking punched out wound. I obtained a piece of tissue for deep culture I did extensively treated this for fungus. It is difficult to imagine that this is a pressure area as the patient states other than going outside he doesn't really wear shoes at home ooThe left plantar first second toe web space looked fairly senescent. Necrotic edges. This required debridement oochange to Hydrofera Blue  to all wound areas 11/10/17; left lateral leg wound continues to contract. Using Hydrofera Blue ooOn the right dorsal first second toe web space dorsally. Culture I did of this area last week grew MRSA there is not an easy oral option in this patient was multiple antibiotic allergies or intolerances. This was only a rare culture isolate I'm therefore going to use Bactroban under silver alginate ooOn the left plantar first second toe web space. Debridement is required here. This is also unchanged 11/17/17; left lateral leg wound continues to contract using Hydrofera Blue this is no longer the major issue. ooThe major concern here is the right first second toe web space. He now has an open area going from dorsally to the plantar aspect. There is now wound on the inner lateral part of the first toe. Not a very viable surface on this. There is erythema spreading medially into the forefoot. ooNo major change in the left first second toe plantar wound 11/24/17; left lateral leg wound continues to contract using Hydrofera Blue. Nice improvement today ooThe right first second toe web space all of this looks a lot less angry than last week. I have given him clindamycin and topical Bactroban for MRSA and terbinafine for the possibility of underlining tinea pedis that I could not control with ketoconazole. Looks somewhat better ooThe area on the plantar left first second toe web space is weeping with dried debris around the wound 12/01/17; left lateral leg wound continues to contract he Hydrofera Blue. It is becoming thinner in terms of with nevertheless it is making good improvement. ooThe right first second toe web space looks less angry but still a large necrotic-looking wounds starting on the plantar aspect of the right foot extending between the toes and now extensively on the base of the right second toe. I gave him clindamycin and topical Bactroban for MRSA anterior benefiting for the possibility of  underlying tinea pedis. Not looking better today ooThe area on the left first/second toe looks better. Debrided of necrotic debris 12/05/17* the patient was worked in urgently today because over the weekend he found blood on his incontinence bad when he woke up. He was found to have an ulcer by his wife who does most of his wound care. He came in today for [place] to look at this. He has not had a history of wounds in his buttocks in spite of his paraplegia. 12/08/17; seen in follow-up today at his usual appointment. He was seen earlier this week and found to have a new wound on his buttock. We also follow him for wounds on the  left lateral leg, left first second toe web space and right first second toe web space 12/15/17; we have been using Hydrofera Blue to the left lateral leg which has improved. The right first second toe web space has also improved. Left first second toe web space plantar aspect looks stable. The left buttock has worsened using Santyl. Apparently the buttock has drainage 12/22/17; we have been using Hydrofera Blue to the left lateral leg which continues to improve now 2 small wounds separated by normal skin. He tells [place] he had a fever up to 100 yesterday he is prone to UTIs but has not noted anything different. He does in and out catheterizations. The area between the first and second toes today does not look good necrotic surface covered with what looks to be purulent drainage and erythema extending into the third toe. I had gotten this to something that I thought look better last time however it is not look good today. He also has a necrotic surface over the buttock wound which is expanded. I thought there might be infection under here so I removed a lot of the surface with a #5 curet though nothing look like it really needed culturing. He is been using Santyl to this area 12/27/17; his original wound on the left lateral leg continues to improve using Hydrofera Blue. I gave him samples  of Baxdella although he was unable to take them out of fear for an allergic reaction ["lump in his throat"].the culture I did of the purulent drainage from his second toe last week showed both enterococcus and a set Enterobacter I was also concerned about the erythema on the bottom of his foot although paradoxically although this looks somewhat better today. 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 We've been using silver alginate here. They came up today that he sleeps in a recliner, I'm not sure why but I asked him to stop this 01/03/18; his original wound we've been using Hydrofera Blue is now separated into 2 areas. ooUlcer on his left buttock is better he is off the recliner and sleeping in bed ooFinally both wound areas between his first and second toes also looks some better 01/10/18; his original wound on the left lateral leg is now separated into 2 wounds we've been using Hydrofera Blue ooUlcer on his left buttock has some drainage. There is a small probing site going into muscle layer superiorly.using silver alginate -He arrives today with a deep tissue injury on the left heel ooThe wound on the dorsal aspect of his first second toe on the left looks a lot betterusing silver alginate ketoconazole ooThe area on the first second toe web space on the right also looks a lot bette 01/17/18; his original wound on the left lateral leg continues to progress using Hydrofera Blue ooUlcer on his left buttock also is smaller surface healthier except for a small probing site going into the muscle layer superiorly. 2.4 cm of tunneling in this area ooDTI on his left heel we have only been offloading. Looks better than last week no threatened open no evidence of infection oothe wound on the dorsal aspect of the first second toe on the left continues to look like it's regressing we have only been using  silver alginate and terbinafine orally ooThe area in the first second toe web space on the right also looks to be a lot better using silver alginate and  terbinafine I think this was prompted by tinea pedis 01/31/18; the patient was hospitalized in [place] last week apparently for a complicated UTI. He was discharged on cefepime he does in and out catheterizations. In the hospital he was discovered M I don't mild elevation of AST and ALT and the terbinafine was stopped.predictably the pressure ulcer on s his buttock looks betterusing silver alginate. The area on the left lateral leg also is better using Hydrofera Blue. The area between the first and second toes on the left better. First and second toes on the right still substantial but better. Finally the DTI on the left heel has held together and looks like it's resolving 02/07/18-he is here in follow-up evaluation for multiple ulcerations. He has new injury to the lateral aspect of the last issue a pressure ulcer, he states this is from adhesive removal trauma. He states he has tried multiple adhesive products with no success. All other ulcers appear stable. The left heel DTI is resolving. We will continue with same treatment plan and follow-up next week. 02/14/18; follow-up for multiple areas. ooHe has a new area last week on the lateral aspect of his pressure ulcer more over the posterior trochanter. The original pressure ulcer looks quite stable has healthy granulation. 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 Almost fully epithelialized on the original superior area using Hydrofera Blue ooDTI on the left heel has peeled off this week to reveal a small superficial wound under denuded skin and subcutaneous tissue ooBoth areas between the first and second toes look better including nothing open on the left 02/21/18; ooThe patient's  wounds on his left ischial tuberosity and posterior left greater trochanter actually looked better. He has a large area of irritation around the area which I think is contact dermatitis. I am doubtful that this is fungal ooHis original wound on the left lateral calf continues to improve we have been using Hydrofera Blue ooThere is no open area in the left first second toe web space although there is a lot of thick callus ooThe DTI on the left heel required debridement today of necrotic surface eschar and subcutaneous tissue using silver alginate ooFinally the area on the right first second toe webspace continues to contract using silver alginate and ketoconazole 02/28/18 ooLeft ischial tuberosity wounds look better using silver alginate. ooOriginal wound on the left calf only has one small open area left using Hydrofera Blue ooDTI on the left heel required debridement mostly removing skin from around this wound surface. Using silver alginate ooThe areas on the right first/second toe web space using silver alginate and ketoconazole 03/08/18 on evaluation today patient appears to be doing decently well as best I can tell in regard to his wounds. This is the first time that I have seen him as he generally is followed by Dr. Dellia Nims. With that being said none of his wounds appear to be infected he does have an area where there is some skin covering what appears to be a new wound on the left dorsal surface of his great toe. This is right at the nail bed. With that being said I do believe that debrided away some of the excess skin can be of benefit in this regard. Otherwise he has been tolerating the dressing changes without complication. 03/14/18; patient arrives today with the multiplicity of wounds that we are following. He has not been systemically unwell ooOriginal wound  on the left lateral calf now only has 2 small open areas we've been using Hydrofera Blue which should continue ooThe deep  tissue injury on the left heel requires debridement today. We've been using silver alginate ooThe left first second toe and the right first second toe are both are reminiscence what I think was tinea pedis. Apparently some of the callus Surface between the toes was removed last week when it started draining. ooPurulent drainage coming from the wound on the ischial tuberosity on the left. 03/21/18-He is here in follow-up evaluation for multiple wounds. 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 He tolerated debridement. The only change to last week's recommendations is to discontinue antifungal cream between toes. He will follow-up next week 03/28/18; following up for multiple wounds;Concern this week is streaking redness and swelling in the right foot. He is going to need antibiotics for this. 03/31/18; follow-up for right foot cellulitis. Streaking redness and swelling in the right foot on 03/28/18. 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 I put him on clindamycin 300 mg every 6 and brought him in for a quick check. He has an open wound between his first and second toes on the right foot as a potential source. 04/04/18; ooRight foot cellulitis is resolving he is completing clindamycin. This is truly good news ooLeft lateral calf wound which is initial wound only has one small open area inferiorly this is close to healing out. He has compression stockings. We will use Hydrofera Blue right down to the epithelialization of this ooNonviable surface on the left heel which was initially pressure with a DTI. We've been using Hydrofera Blue. I'm going to switch this back to silver alginate ooLeft first second toe/tinea pedis this looks better using silver alginate ooRight first second toe tinea pedis using silver alginate ooLarge pressure ulcers on theLeft ischial tuberosity. Small wound here Looks better. I  am uncertain about the surface over the large wound. Using silver alginate 04/11/18; ooCellulitis in the right foot is resolved ooLeft lateral calf wound which was his original wounds still has 2 tiny open areas remaining this is just about closed ooNonviable surface on the left heel is better but still requires debridement ooLeft first second toe/tinea pedis still open using silver alginate ooRight first second toe wound tinea pedis I asked him to go back to using ketoconazole and silver alginate ooLarge pressure ulcers on the left ischial tuberosity this shear injury here is resolved. Wound is smaller. No evidence of infection using silver alginate 04/18/18; ooPatient arrives with an intense area of cellulitis in the right mid lower calf extending into the right heel area. Bright red and warm. Smaller area on the left anterior leg. He has a significant history of MRSA. He will definitely need antibioticsoodoxycycline ooHe now has 2 open areas on the left ischial tuberosity the original large wound and now a satellite area which I think was above his initial satellite areas. Not a wonderful surface on this satellite area surrounding erythema which looks like pressure related. ooHis left lateral calf wound again his original wound is just about closed ooLeft heel pressure injury still requiring debridement ooLeft first second toe looks a lot better using silver alginate ooRight first second toe also using silver alginate and ketoconazole cream also looks better 04/20/18; the patient was worked in early today out of concerns with his cellulitis on the right leg. I had started him on doxycycline. This was  2 days ago. His wife was concerned about the swelling in the area. Also concerned about the left buttock. He has not been systemically unwell no fever chills. No nausea vomiting or diarrhea 04/25/18; the patient's left buttock wound is continued to deteriorate he is using Hydrofera Blue.  He is still completing clindamycin for the cellulitis on the right leg although all of this looks better. 05/02/18 ooLeft buttock wound still with a lot of drainage and a very tightly adherent fibrinous necrotic surface. He has a deeper area superiorly ooThe left lateral calf wound is still closed ooDTI wound on the left heel necrotic surface especially the circumference using Iodoflex ooAreas between his left first second toe and right first second toe both look better. Dorsally and the right first second toe he had a necrotic surface although at smaller. In using silver alginate and ketoconazole. I did a culture last week which was a deep tissue culture of the reminiscence of the open wound on the right first second toe dorsally. This grew a few Acinetobacter and a few methicillin-resistant staph aureus. Nevertheless the area actually this week looked better. I didn't feel the need to specifically address this at least in terms of systemic antibiotics. 05/09/18; wounds are measuring larger more drainage per our intake. We are using Santyl covered with alginate on the large superficial buttock wounds, Iodosorb on the left heel, ketoconazole and silver alginate to the dorsal first and second toes bilaterally. 05/16/18; ooThe area on his left buttock better in some aspects although the area superiorly over the ischial tuberosity required an extensive debridement.using Santyl ooLeft heel appears stable. Using Iodoflex ooThe areas between his first and second toes are not bad however there is spreading erythema up the dorsal aspect of his left foot this looks like cellulitis again. He is insensate the erythema is really very brilliant.o Erysipelas He went to see an allergist days ago because he was itching part of this he had lab work done. This showed a white count of 15.1 with 70% neutrophils. Hemoglobin of 11.4 and a platelet count of 659,000. Last white count we had in Epic was a 2-1/2 years  ago which was 25.9 but he was ill at the time. He was able to show me some lab work that was done by his primary physician the pattern is about the same. 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 But prompted me to go ahead and do x-rays of both feet and the pelvis rule out osteomyelitis. He also had a comprehensive metabolic panel this was reasonably normal his albumin was 3.7 liver function tests BUN/creatinine all normal 05/23/18; x-rays of both his feet from last week were negative for underlying pulmonary abnormality. The x-ray of his pelvis however showed mild irregularity in the left ischial which may represent some early osteomyelitis. The wound in the left ischial continues to get deeper clearly now exposed muscle. Each week necrotic surface material over this area. Whereas the rest of the wounds do not look so bad. ooThe left ischial wound we have been using Santyl and calcium alginate ooT the left heel surface necrotic debris using Iodoflex o ooThe left lateral leg is still healed ooAreas on the left dorsal foot and the right dorsal foot are about the same. There is some inflammation on the left which might represent contact dermatitis, fungal dermatitis I am doubtful cellulitis although this looks better than last week 05/30/18; [facility]  [facility] show any osteomyelitis or abscess. Suggested the possibility of underlying cellulitis although I don't see a lot of evidence of this at the bedside ooThe wound itself on the left buttock/upper thigh actually looks somewhat better. No debridement ooLeft heel also looks better no debridement continue Iodoflex ooBoth dorsal first second toe spaces appear better using Lotrisone. Left still required debridement 06/06/18; ooIntake reported some purulent looking drainage from the left gluteal wound. Using Santyl and calcium alginate ooLeft heel looks better although still a nonviable  surface requiring debridement ooThe left dorsal foot first/second webspace actually expanding and somewhat deeper. I may consider doing a shave biopsy of this area ooRight dorsal foot first/second webspace appears stable to improved. Using Lotrisone and silver alginate to both these areas 06/13/18 ooLeft gluteal surface looks better. Now separated in the 2 wounds. No debridement required. Still drainage. We'll continue silver alginate ooLeft heel continues to look better with Iodoflex continue this for at least another week ooOf his dorsal foot wounds the area on the left still has some depth although it looks better than last week. We've been using Lotrisone and silver alginate 06/20/18 ooLeft gluteal continues to look better healthy tissue ooLeft heel continues to look better healthy granulation wound is smaller. He is using Iodoflex and his long as this continues continue the Iodoflex ooDorsal right foot looks better unfortunately dorsal left foot does not. There is swelling and erythema of his forefoot. He had minor trauma to this several days ago but doesn't think this was enough to have caused any tissue injury. Foot looks like cellulitis, we have had this problem before 06/27/18 on evaluation today patient appears to be doing a little worse in regard to his foot ulcer. 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 Both of which would really be his only options for treating this infection. In the past he has been given and effusion of Orbactiv. This is done very well for him in the past again it's one time dosing IV antibiotic therapy. Subsequently I do believe this is something we're gonna need to see about doing at this point in time. Currently his other wounds seem to be doing somewhat better in my pinion I'm pretty happy in that regard. 07/03/18 on  evaluation today patient's wounds actually appear to be doing fairly well. He has been tolerating the dressing changes without complication. All in all he seems to be showing signs of improvement. In regard to the antibiotics he has been dealing with infectious disease since I saw him last week as far as getting this scheduled. In the end [facility]'s [facility] this coming Friday. In the meantime he has been continuing to perform the dressing changes in such as previous. There does not appear to be any evidence of infection worsengin at this time. 07/10/18; ooSince I last saw this man 2 weeks ago things have actually improved. IV antibiotics of resulted in less forefoot erythema although there is still some present. He is not systemically unwell ooLeft buttock wounds o2 now have no depth there is increased epithelialization Using silver alginate ooLeft heel still requires debridement using Iodoflex ooLeft dorsal foot still with a sizable wound about the size of a border but healthy granulation ooRight dorsal foot still with a slitlike area using silver alginate 07/18/18; the  patient's cellulitis in the left foot is improved in fact I think it is on its way to resolving. ooLeft buttock wounds o2 both look better although the larger one has hypertension granulation we've been using silver alginate ooLeft heel has some thick circumferential redundant skin over the wound edge which will need to be removed today we've been using Iodoflex ooLeft dorsal foot is still a sizable wound required debridement using silver alginate ooThe right dorsal foot is just about closed only a small open area remains here 07/25/18; left foot cellulitis is resolved ooLeft buttock wounds o2 both look better. Hyper-granulation on the major area ooLeft heel as some debris over the surface but otherwise looks a healthier wound. Using silver collagen ooRight dorsal  foot is just about closed 07/31/18; arrives with our intake nurse worried about purulent drainage from the buttock. We had hyper-granulation here last week ooHis buttock wounds o2 continue to look better ooLeft heel some debris over the surface but measuring smaller. ooRight dorsal foot unfortunately has openings between the toes ooLeft foot superficial wound looks less aggravated. 08/07/18 ooButtock wounds continue to look better although some of her granulation and the larger medial wound. silver alginate ooLeft heel continues to look a lot better.silver collagen ooLeft foot superficial wound looks less stable. Requires debridement. He has a new wound superficial area on the foot on the lateral dorsal foot. ooRight foot looks better using silver alginate without Lotrisone 08/14/2018; patient was in the ER last week diagnosed with a UTI. He is now on Cefpodoxime and Macrodantin. ooButtock wounds continued to be smaller. Using silver alginate ooLeft heel continues to look better using silver collagen ooLeft foot superficial wound looks as though it is improving ooRight dorsal foot area is just about healed. 08/21/2018; patient is completed his antibiotics for his UTI. ooHe has 2 open areas on the buttocks. There is still not closed although the surface looks satisfactory. Using silver alginate ooLeft heel continues to improve using silver collagen ooThe bilateral dorsal foot areas which are at the base of his first and second toes/possible tinea pedis are actually stable on the left but worse on the right. The area on the left required debridement of necrotic surface. After debridement I obtained a specimen for PCR culture. ooThe right dorsal foot which is been just about healed last week is now reopened 08/28/2018; culture done on the left dorsal foot showed coag negative staph both staph epidermidis and Lugdunensis. I think this is worthwhile initiating systemic treatment. I will  use doxycycline given his long list of allergies. The area on the left heel slightly improved but still requiring debridement. ooThe large wound on the buttock is just about closed whereas the smaller one is larger. Using silver alginate in this area 09/04/2018; patient is completing his doxycycline for the left foot although this continues to be a very difficult wound area with very adherent necrotic debris. We are using silver alginate to all his wounds right foot left foot and the small wounds on his buttock, silver collagen on the left heel. 09/11/2018; once again this patient has intense erythema and swelling of the left forefoot. Lesser degrees of erythema in the right foot. He has a long list of allergies and intolerances. I will reinstitute doxycycline. 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 Using silver alginate ooLeft heel also looks better using silver collagen ooUnfortunately both the areas on his feet look worse. The area on the  left first second webspace is now gone through to the plantar part of his foot. The area on the left foot anteriorly is irritated with erythema and swelling in the forefoot. 09/25/2018 ooHis wound on the left plantar heel looks better. Using silver collagen ooThe area on the left buttock 2 small remnant areas. One is closed one is still open. Using silver alginate ooThe areas between both his first and second toes look worse. This in spite of long-standing antifungal therapy with ketoconazole and silver alginate which should have antifungal activity ooHe has small areas around his original wound on the left calf one is on the bottom of the original scar tissue and one superiorly both of these are small and superficial but again given wound history in this site this is worrisome 10/02/2018 ooLeft plantar heel continues to gradually contract using silver collagen ooLeft buttock wound is unchanged  using silver alginate ooThe areas on his dorsal feet between his first and second toes bilaterally look about the same. 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 Small open area here. We have been using silver alginate 10/09/2018; ooLeft plantar heel continues to look healthy and contract. No debridement is required ooLeft buttock slightly smaller with a tape injury wound just below which was new this week ooDorsal feet somewhat improved I have been using clindamycin ooLeft lateral looks lower extremity the actual open area looks worse although a lot of this is epithelialized. I am going to change to silver collagen today He has a lot more swelling in the right leg although this is not pitting not red and not particularly warm there is a lot of spasm in the right leg usually indicative of people with paralysis of some underlying discomfort. We have reviewed his vascular status from 2017 he had a left greater saphenous vein ablation. I wonder about referring him back to vascular surgery if the area on the left leg continues to deteriorate. 10/16/2018 in today for follow-up and management of multiple lower extremity ulcers. His left Buttock wound is much lower smaller and almost closed completely. The wound to the left ankle has began to reopen with Epithelialization and some adherent slough. He has multiple new areas to the left foot and leg. The left dorsal foot without much improvement. Wound present between left great webspace and 2nd toe. Erythema and edema present right leg. Right LE ultrasound obtained on 10/10/18 was negative for DVT . 10/23/2018; ooLeft buttock is closed over. Still dry macerated skin but there is no open wound. I suspect this is chronic pressure/moisture ooLeft lateral calf is quite a bit  worse than when I saw this last. There is clearly drainage here he has macerated skin into the left plantar heel. We will change the primary dressing to alginate ooLeft dorsal foot has some improvement in overall wound area. Still using clindamycin and silver alginate ooRight dorsal foot about the same as the left using clindamycin and silver alginate ooThe erythema in the right leg has resolved. He is DVT rule out was negative ooLeft heel pressure area required debridement although the wound is smaller and the surface is health 10/26/2018 ooThe patient came back in for his nurse check today predominantly because of the drainage coming out of the left lateral leg with a recent reopening of his original wound on the left lateral calf. He  comes in today with a large amount of surrounding erythema around the wound extending from the calf into the ankle and even in the area on the dorsal foot. He is not systemically unwell. He is not febrile. Nevertheless this looks like cellulitis. We have been using silver alginate to the area. I changed him to a regular visit and I am going to prescribe him doxycycline. The rationale here is a long list of medication intolerances and a history of MRSA. I did not see anything that I thought would provide a valuable culture 10/30/2018 ooFollow-up from his appointment 4 days ago with really an extensive area of cellulitis in the left calf left lateral ankle and left dorsal foot. I put him on doxycycline. He has a long list of medication allergies which are true allergy reactions. 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 In any case he is a lot better today. The erythema especially in the anterior and lateral left calf is better. He still has left ankle erythema. He also is complaining about increasing edema in the right leg we have only been using Kerlix Coban and he has been doing the wraps at  home. Finally he has a spotty rash on the medial part of his upper left calf which looks like folliculitis or perhaps wrap occlusion type injury. Small superficial macules not pustules 11/06/18 patient arrives today with again a considerable degree of erythema around the wound on the left lateral calf extending into the dorsal ankle and dorsal foot. This is a lot worse than when I saw this last week. He is on doxycycline really with not a lot of improvement. He has not been systemically unwell Wounds on the; left heel actually looks improved. Original area on the left foot and proximity to the first and second toes looks about the same. He has superficial areas on the dorsal foot, anterior calf and then the reopening of his original wound on the left lateral calf which looks about the same ooThe only area he has on the right is the dorsal webspace first and second which is smaller. ooHe has a large area of dry erythematous skin on the left buttock small open area here. 11/13/2018; the patient arrives in much better condition. The erythema around the wound on the left lateral calf is a lot better. Not sure whether this was the clindamycin or the TCA and ketoconazole or just in the improvement in edema control [stasis dermatitis]. In any case this is a lot better. The area on the left heel is very small and just about resolved using silver collagen we have been using silver alginate to the areas on his dorsal feet 11/20/2018; his wounds include the left lateral calf, left heel, dorsal aspects of both feet just proximal to the first second webspace. He is stable to slightly improved. I did not think any changes to his dressings were going to be necessary 11/27/2018 he has a reopening on the left buttock which is surrounded by what looks like tinea or perhaps some other form of dermatitis. The area on the left dorsal foot has some erythema around it I have marked this area but I am not sure whether this is  cellulitis or not. Left heel is not closed. Left calf the reopening is really slightly longer and probably worse 1/13; in general things look better and smaller except for the left dorsal foot. Area on the left heel is just  about closed, left buttock looks better only a small wound remains in the skin looks better [using Lotrisone] 1/20; the area on the left heel only has a few remaining open areas here. Left lateral calf about the same in terms of size, left dorsal foot slightly larger right lateral foot still not closed. The area on the left buttock has no open wound and the surrounding skin looks a lot better 1/27; the area on the left heel is closed. Left lateral calf better but still requiring extensive debridements. The area on his left buttock is closed. He still has the open areas on the left dorsal foot which is slightly smaller in the right foot which is slightly expanded. We have been using Iodoflex on these areas as well 2/3; left heel is closed. Left lateral calf still requiring debridement using Iodoflex there is no open area on his left buttock however he has dry scaly skin over a large area of this. Not really responding well to the Lotrisone. Finally the areas on his dorsal feet at the level of the first second webspace are slightly smaller on the right and about the same on the left. Both of these vigorously debrided with Anasept and gauze 2/10; left heel remains closed he has dry erythematous skin over the left buttock but there is no open wound here. Left lateral leg has come in and with. Still requiring debridement we have been using Iodoflex here. Finally the area on the left dorsal foot and right dorsal foot are really about the same extremely dry callused fissured areas. He does not yet have a dermatology appointment 2/17; left heel remains closed. He has a new open area on the left buttock. The area on the left lateral calf is bigger longer and still covered in necrotic  debris. No major change in his foot areas bilaterally. I am awaiting for a dermatologist to look on this. We have been using ketoconazole I do not know that this is been doing any good at all. 2/24; left heel remains closed. The left buttock wound that was new reopening last week looks better. The left lateral calf appears better also although still requires debridement. The major area on his foot is the left first second also requiring debridement. We have been putting Prisma on all wounds. I do not believe that the ketoconazole has done too much good for his feet. He will use Lotrisone I am going to give him a 2-week course of terbinafine. We still do not have a dermatology appointment 3/2 left heel remains closed however there is skin over bone in this area I pointed this out to him today. The left buttock wound is epithelialized but still does not look completely stable. The area on the left leg required debridement were using silver collagen here. With regards to his feet we changed to Lotrisone last week and silver alginate. 3/9; left heel remains closed. Left buttock remains closed. The area on the right foot is essentially closed. The left foot remains unchanged. Slightly smaller on the left lateral calf. Using silver collagen to both of these areas 3/16-Left heel remains closed. Area on right foot is closed. Left lateral calf above the lateral malleolus open wound requiring debridement with easy bleeding. Left dorsal wound proximal to first toe also debrided. Left ischial area open new. Patient has been using Prisma with wrapping every 3 days. Dermatology appointment is apparently tomorrow.Patient has completed his terbinafine 2-week course with some apparent improvement according to him, there is  still flaking and dry skin in his foot on the left 3/23; area on the right foot is reopened. The area on the left anterior foot is about the same still a very necrotic adherent surface. He still  has the area on the left leg and reopening is on the left buttock. He apparently saw dermatology although I do not have a note. According to the patient who is usually fairly well informed they did not have any good ideas. Put him on oral terbinafine which she is been on before. 3/30; using silver collagen to all wounds. Apparently his dermatologist put him on doxycycline and rifampin presumably some culture grew staph. I do not have this result. He remains on terbinafine although I have used terbinafine on him before 4/6; patient has had a fairly substantial reopening on the right foot between the first and second toes. He is finished his terbinafine and I believe is on doxycycline and rifampin still as prescribed by dermatology. We have been using silver collagen to all his wounds although the patient reports that he thinks silver alginate does better on the wounds on his buttock. 4/13; the area on his left lateral calf about the same size but it did not require debridement. ooLeft dorsal foot just proximal to the webspace between the first and second toes is about the same. Still nonviable surface. I note some superficial bronze discoloration of the dorsal part of his foot ooRight dorsal foot just proximal to the first and second toes also looks about the same. I still think there may be the same discoloration I noted above on the left ooLeft buttock wound looks about the same 4/20; left lateral calf appears to be gradually contracting using silver collagen. 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 The left dorsal foot wound is debrided of tightly adherent necrotic debris and really cleans up quite nicely. The right area is worse with expansion. I did not debride this it is now over the base of the second toe ooThe area on his left buttock is smaller no debridement is required using silver collagen 5/4; left calf continues to make  good progress. ooHe arrives with erythema around the wounds on his dorsal foot which even extends to the plantar aspect. Very concerning for coexistent infection. He is finished the erythromycin I gave him for possible erythrasma this does not seem to have helped. ooThe area on the left foot is about the same base of the dorsal toes ooIs area on the buttock looks improved on the left 5/11; left calf and left buttock continued to make good progress. Left foot is about the same to slightly improved. ooMajor problem is on the right foot. He has not had an x-ray. Deep tissue culture I did last week showed both Enterobacter and E. coli. I did not change the doxycycline I put him on empirically although neither 1 of these were plated to doxycycline. He arrives today with the erythema looking worse on both the dorsal and plantar foot. Macerated skin on the bottom of the foot. he has not been systemically unwell 5/18-Patient returns at 1 week, left calf wound appears to be making some progress, left buttock wound appears slightly worse than last time, left foot wound looks slightly better, right foot redness is marginally better. X-ray of both feet show no air or evidence of osteomyelitis. Patient is finished his Omnicef and terbinafine. He continues to have macerated skin on the bottom of the left foot  as well as right 5/26; left calf wound is better, left buttock wound appears to have multiple small superficial open areas with surrounding macerated skin. X-rays that I did last time showed no evidence of osteomyelitis in either foot. He is finished cefdinir and doxycycline. I do not think that he was on terbinafine. He continues to have a large superficial open area on the right foot anterior dorsal and slightly between the first and second toes. I did send him to dermatology 2 months ago or so wondering about whether they would do a fungal scraping. I do not believe they did but did do a culture. We  have been using silver alginate to the toe areas, he has been using antifungals at home topically either ketoconazole or Lotrisone. We are using silver collagen on the left foot, silver alginate on the right, silver collagen on the left lateral leg and silver alginate on the left buttock 6/1; left buttock area is healed. We have the left dorsal foot, left lateral leg and right dorsal foot. We are using silver alginate to the areas on both feet and silver collagen to the area on his left lateral calf 6/8; the left buttock apparently reopened late last week. He is not really sure how this happened. He is tolerating the terbinafine. Using silver alginate to all wounds 6/15; left buttock wound is larger than last week but still superficial. ooCame in the clinic today with a report of purulence from the left lateral leg I did not identify any infection ooBoth areas on his dorsal feet appear to be better. He is tolerating the terbinafine. Using silver alginate to all wounds 6/22; left buttock is about the same this week, left calf quite a bit better. His left foot is about the same however he comes in with erythema and warmth in the right forefoot once again. Culture that I gave him in the beginning of May showed Enterobacter and E. coli. I gave him doxycycline and things seem to improve although neither 1 of these organisms was specifically plated. 6/29; left buttock is larger and dry this week. Left lateral calf looks to me to be improved. Left dorsal foot also somewhat improved right foot completely unchanged. The erythema on the right foot is still present. He is completing the Ceftin dinner that I gave him empirically [see discussion above.) 7/6 - All wounds look to be stable and perhaps improved, the left buttock wound is slightly smaller, per patient bleeds easily, completed ceftin, the right foot redness is less, he is on terbinafine 7/13; left buttock wound about the same perhaps slightly  narrower. Area on the left lateral leg continues to narrow. Left dorsal foot slightly smaller right foot about the same. We are using silver alginate on the right foot and Hydrofera Blue to the areas on the left. Unna boot on the left 2 layer compression on the right 7/20; left buttock wound absolutely the same. Area on lateral leg continues to get better. Left dorsal foot require debridement as did the right no major change in the 7/27; left buttock wound the same size necrotic debris over the surface. The area on the lateral leg is closed once again. His left foot looks better right foot about the same although there is some involvement now of the posterior first second toe area. He is still on terbinafine which I have given him for a month, not certain a centimeter major change 06/25/19-All wounds appear to be slightly improved according to report, left buttock  wound looks clean, both foot wounds have minimal to no debris the right dorsal foot has minimal slough. We are using Hydrofera Blue to the left and silver alginate to the right foot and ischial wound. 8/10-Wounds all appear to be around the same, the right forefoot distal part has some redness which was not there before, however the wound looks clean and small. Ischial wound looks about the same with no changes 8/17; his wound on the left lateral calf which was his original chronic venous insufficiency wound remains closed. Since I last saw him the areas on the left dorsal foot right dorsal foot generally appear better but require debridement. The area on his left initial tuberosity appears somewhat larger to me perhaps hyper granulated and bleeds very easily. We have been using Hydrofera Blue to the left dorsal foot and silver alginate to everything else 8/24; left lateral calf remains closed. The areas on his dorsal feet on the webspace of the first and second toes bilaterally both look better. The area on the left buttock which is the  pressure ulcer stage II slightly smaller. I change the dressing to Hydrofera Blue to all areas 8/31; left lateral calf remains closed. The area on his dorsal feet bilaterally look better. Using Hydrofera Blue. Still requiring debridement on the left foot. No change in the left buttock pressure ulcers however 9/14; left lateral calf remains closed. Dorsal feet look quite a bit better than 2 weeks ago. Flaking dry skin also a lot better with the ammonium lactate I gave him 2 weeks ago. The area on the left buttock is improved. He states that his Roho cushion developed a leak and he is getting a new one, in the interim he is offloading this vigorously 9/21; left calf remains closed. Left heel which was a possible DTI looks better this week. He had macerated tissue around the left dorsal foot right foot looks satisfactory and improved left buttock wound. I changed his dressings to his feet to silver alginate bilaterally. Continuing Hydrofera Blue on the left buttock. 9/28 left calf remains closed. Left heel did not develop anything [possible DTI] dry flaking skin on the left dorsal foot. Right foot looks satisfactory. Improved left buttock wound. We are using silver alginate on his feet Hydrofera Blue on the buttock. I have asked him to go back to the Lotrisone on his feet including the wounds and surrounding areas 10/5; left calf remains closed. The areas on the left and right feet about the same. A lot of this is epithelialized however debris over the remaining open areas. He is using Lotrisone and silver alginate. The area on the left buttock using Hydrofera Blue 10/26. Patient has been out for 3 weeks secondary to Covid concerns. He tested negative but I think his wife tested positive. He comes in today with the left foot substantially worse, right foot about the same. Even more concerning he states that the area on his left buttock closed over but then reopened and is considerably deeper in one  aspect than it was before [stage III wound] 11/2; left foot really about the same as last week. Quarter sized wound on the dorsal foot just proximal to the first second toes. Surrounding erythema with areas of denuded epithelium. This is not really much different looking. Did not look like cellulitis this time however. ooRight foot area about the same.. We have been using silver alginate alginate on his toes ooLeft buttock still substantial irritated skin around the wound which I think  looks somewhat better. We have been using Hydrofera Blue here. 11/9; left foot larger than last week and a very necrotic surface. Right foot I think is about the same perhaps slightly smaller. Debris around the circumference also addressed. Unfortunately on the left buttock there is been a decline. Satellite lesions below the major wound distally and now a an additional one posteriorly we have been using Hydrofera Blue but I think this is a pressure issue 11/16; left foot ulcer dorsally again a very adherent necrotic surface. Right foot is about the same. Not much change in the pressure ulcer on his left buttock. 11/30; left foot ulcer dorsally basically the same as when I saw him 2 weeks ago. Very adherent fibrinous debris on the wound surface. Patient reports a lot of drainage as well. 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 We have been using Iodoflex, patient changed back to alginate because of the drainage. Area on his right dorsal foot really looks benign with a healthier surface certainly a lot better than on the left. Left buttock wounds all improved using Hydrofera Blue 12/7; left dorsal foot again no improvement. Tightly adherent debris. PCR culture I did last week only showed likely skin contaminant. I have gone ahead and done a punch biopsy of this which is about the last thing in terms of investigations I can think to do. He has known venous insufficiency  and venous hypertension and this could be the issue here. The area on the right foot is about the same left buttock slightly worse according to our intake nurse secondary to [facility] Blue sticking to the wound 12/14; biopsy of the left foot that I did last time showed changes that could be related to wound healing/chronic stasis dermatitis phenomenon no neoplasm. We have been using silver alginate to both feet. I change the one on the left today to Sorbact and silver alginate to his other 2 wounds 12/28; the patient arrives with the following problems; ooMajor issue is the dorsal left foot which continues to be a larger deeper wound area. Still with a completely nonviable surface ooParadoxically the area mirror image on the right on the right dorsal foot appears to be getting better. ooHe had some loss of dry denuded skin from the lower part of his original wound on the left lateral calf. Some of this area looked a little vulnerable and for this reason we put him in wrap that on this side this week ooThe area on his left buttock is larger. He still has the erythematous circular area which I think is a combination of pressure, sweat. This does not look like cellulitis or fungal dermatitis 11/26/2019; -Dorsal left foot large open wound with depth. Still debris over the surface. Using Sorbact ooThe area on the dorsal right foot paradoxically has closed over [facility] has a reopening on the left ankle laterally at the base of his original wound that extended up into the calf. This appears clean. ooThe left buttock wound is smaller but with very adherent necrotic debris over the surface. We have been using silver alginate here as well The patient had arterial studies done in 2017. He had biphasic waveforms at the dorsalis pedis and posterior tibial bilaterally. ABI in the left was 1.17. Digit waveforms were dampened. He has slight spasticity in the great toes I do not think a TBI would be  possible 1/11; the patient comes in today with a sizable reopening between the first and second  toes on the right. This is not exactly in the same location where we have been treating wounds previously. According to our intake nurse this was actually fairly deep but 0.6 cm. The area on the left dorsal foot looks about the same the surface is somewhat cleaner using Sorbact, his MRI is in 2 days. We have not managed yet to get arterial studies. The new reopening on the left lateral calf looks somewhat better using alginate. The left buttock wound is about the same using alginate 1/18; the patient had his ARTERIAL studies which were quite normal. ABI in the right at 1.13 with triphasic/biphasic waveforms on the left ABI 1.06 again with triphasic/biphasic waveforms. It would not have been possible to have done a toe brachial index because of spasticity. We have been using Sorbac to the left foot alginate to the rest of his wounds on the right foot left lateral calf and left buttock 1/25; arrives in clinic with erythema and swelling of the left forefoot worse over the first MTP area. This extends laterally dorsally and but also posteriorly. Still has an area on the left lateral part of the lower part of his calf wound it is eschared and clearly not closed. ooArea on the left buttock still with surrounding irritation and erythema. ooRight foot surface wound dorsally. The area between the right and first and second toes appears better. 2/1; ooThe left foot wound is about the same. Erythema slightly better I gave him a week of doxycycline empirically ooRight foot wound is more extensive extending between the toes to the plantar surface ooLeft lateral calf really no open surface on the inferior part of his original wound however the entire area still looks vulnerable ooAbsolutely no improvement in the left buttock wound required debridement. 2/8; the left foot is about the same. Erythema is slightly  improved I gave him clindamycin last week. ooRight foot looks better he is using Lotrimin and silver alginate ooHe has a breakdown in the left lateral calf. Denuded epithelium which I have removed ooLeft buttock about the same were using Hydrofera Blue 2/15; left foot is about the same there is less surrounding erythema. Surface still has tightly adherent debris which I have debriding however not making any progress ooRight foot has a substantial wound on the medial right second toe between the first and second webspace. ooStill an open area on the left lateral calf distal area. ooButtock wound is about the same 2/22; left foot is about the same less surrounding erythema. Surface has adherent debris. Polymen Ag Right foot area significant wound between the first and second toes. We have been using silver alginate here Left lateral leg polymen Ag at the base of his original venous insufficiency wound ooLeft buttock some improvement here 3/1; ooRight foot is deteriorating in the first second toe webspace. Larger and more substantial. We have been using silver alginate. ooLeft dorsal foot about the same markedly adherent surface debris using PolyMem Ag ooLeft lateral calf surface debris using PolyMem AG ooLeft buttock is improved again using PolyMem Ag. ooHe is completing his terbinafine. The erythema in the foot seems better. He has been on this for 2 weeks 3/8; no improvement in any wound area in fact he has a small open area on the dorsal midfoot which is new this week. He has not gotten his foot x-rays yet 3/15; his x-rays were both negative for osteomyelitis of both feet. No major change in any of his wounds on the extremities however his buttock wounds  are better. We have been using polymen on the buttocks, left lower leg. Iodoflex on the left foot and silver alginate on the right 3/22; arrives in clinic today with the 2 major issues are the improvement in the left dorsal foot  wound which for once actually looks healthy with a nice healthy wound surface without debridement. Using Iodoflex here. Unfortunately on the left lateral calf which is in the distal part of his original wound he came to the clinic here for there was purulent drainage noted some increased breakdown scattered around the original area and a small area proximally. We we are using polymen here will change to silver alginate today. His buttock wound on the left is better and I think the area on the right first second toe webspace is also improved 3/29; left dorsal foot looks better. Using Iodoflex. Left ankle culture from deterioration last time grew E. coli, Enterobacter and Enterococcus. I will give him a course of cefdinir although that will not cover Enterococcus. The area on the right foot in the webspace of the first and second toe lateral first toe looks better. The area on his buttock is about healed Vascular appointment is on April 21. This is to look at his venous system vis--vis continued breakdown of the wounds on the left including the left lateral leg and left dorsal foot he. He has had previous ablations on this side 4/5; the area between the right first and second toes lateral aspect of the first toe looks better. Dorsal aspect of the left first toe on the left foot also improved. Unfortunately the left lateral lower leg is larger and there is a second satellite wound superiorly. The usual superficial abrasions on the left buttock overall better but certainly not closed 4/12; the area between the right first and second toes is improved. Dorsal aspect of the left foot also slightly smaller with a vibrant healthy looking surface. No real change in the left lateral leg and the left buttock wound is healed He has an unaffordable co-pay for Apligraf. Appointment with vein and vascular with regards to the left leg venous part of the circulation is on 4/21 4/19; we continue to see improvement in  all wound areas. Although this is minor. He has his vascular appointment on 4/21. The area on the left buttock has not reopened although right in the center of this area the skin looks somewhat threatened 4/26; the left buttock is unfortunately reopened. In general his left dorsal foot has a healthy surface and looks somewhat smaller although it was not measured as such. The area between his first and second toe webspace on the right as a small wound against the first toe. The patient saw vascular surgery. The real question I was asking was about the small saphenous vein on the left. He has previously ablated left greater saphenous vein. Nothing further was commented on on the left. Right greater saphenous vein without reflux at the saphenofemoral junction or proximal thigh there was no indication for ablation of the right greater saphenous vein duplex was negative for DVT bilaterally. They did not think there was anything from a vascular surgery point of view that could be offered. They ABIs within normal limits 5/3; only small open area on the left buttock. The area on the left lateral leg which was his original venous reflux is now 2 wounds both which look clean. We are using Iodoflex on the left dorsal foot which looks healthy and smaller. He is down to  a very tiny area between the right first and second toes, using silver alginate 5/10; all of his wounds appear better. We have much better edema control in 4 layer compression on the left. This may be the factor that is allowing the left foot and left lateral calf to heal. He has external compression garments at home 04/14/20-All of his wounds are progressing well, the left forefoot is practically closed, left ischium appears to be about the same, right toe webspace is also smaller. The left lateral leg is about the same, continue using Hydrofera Blue to this, silver alginate to the ischium, Iodoflex to the toe space on the right 6/7; most of his  wounds outside of the left buttock are doing well. The area on the left lateral calf and left dorsal foot are smaller. The area on the right foot in between the first and second toe webspace is barely visible although he still says there is some drainage here is the only reason I did not heal this out. ooUnfortunately the area on the left buttock almost looks like he has a skin tear from tape. He has open wound and then a large flap of skin that we are trying to get adherence over an area just next to the remaining wound 6/21; 2 week follow-up. I believe is been here for nurse visits. Miraculously the area between his first and second toes on the left dorsal foot is closed over. Still open on the right first second web space. The left lateral calf has 2 open areas. Distally this is more superficial. The proximal area had a little more depth and required debridement of adherent necrotic material. His buttock wound is actually larger we have been using silver alginate here 6/28; the patient's area on the left foot remains closed. Still open wet area between the first and second toes on the right and also extending into the plantar aspect. We have been using silver alginate in this location. He has 2 areas on the left lower leg part of his original long wounds which I think are better. We have been using Hydrofera Blue here. Hydrofera Blue to the left buttock which is stable 7/12; left foot remains closed. Left ankle is closed. May be a small area between his right first and second toes the only truly open area is on the left buttock. We have been using Hydrofera Blue here 7/19; patient arrives with marked deterioration especially in the left foot and ankle. We did not put him in a compression wrap on the left last week in fact he wore his juxta lite stockings on either side although he does not have an underlying stocking. He has a reopening on the left dorsal foot, left lateral ankle and a new area on  the right dorsal ankle. More worrisome is the degree of erythema on the left foot extending on the lateral foot into the lateral lower leg on the left 7/26; the patient had erythema and drainage from the lateral left ankle last week. Culture of this grew MRSA resistant to doxycycline and clindamycin which are the 2 antibiotics we usually use with this patient who has multiple antibiotic allergies including linezolid, trimethoprim sulfamethoxazole. I had give him an empiric doxycycline and he comes in the area certainly looks somewhat better although it is blotchy in his lower leg. He has not been systemically unwell. He has had areas on the left dorsal foot which is a reopening, chronic wounds on the left lateral ankle. Both of  these I think are secondary to chronic venous insufficiency. The area between his first and second toes is closed as far as I can tell. He had a new wrap injury on the right dorsal ankle last week. Finally he has an area on the left buttock. We have been using silver alginate to everything except the left buttock we are using Hydrofera Blue 06/30/20-Patient returns at 1 week, has been given a sample dose pack of NUZYRA which is a tetracycline derivative [omadacycline], patient has completed those, we have been using silver alginate to almost all the wounds except the left ischium where we are using Hydrofera Blue all of them look better 8/16; since I last saw the patient he has been doing well. The area on the left buttock, left lateral ankle and left foot are all closed today. He has completed the [place] I gave him last time and tolerated this well. He still has open areas on the right dorsal ankle and in the right first second toe area which we are using silver alginate. 8/23; we put him in his bilateral external compression stockings last week as he did not have anything open on either leg except for concerning area between the right first and second toe. He comes in today with  an area on the left dorsal foot slightly more proximal than the original wound, the left lateral foot but this is actually a continuation of the area he had on the left lateral ankle from last time. As well he is opened up on the left buttock again. 8/30; comes in today with things looking a lot better. The area on the left lower ankle has closed down as has the left foot but with eschar in both areas. The area on the dorsal right ankle is also epithelialized. Very little remaining of the left buttock wound. We have been using silver alginate on all wound areas 9/13; the area in the first second toe webspace on the right has fully epithelialized. He still has some vulnerable epithelium on the right and the ankle and the dorsal foot. He notes weeping. He is using his juxta lite stocking. On the left again the left dorsal foot is closed left lateral ankle is closed. We went to the juxta lite stocking here as well. ooStill vulnerable in the left buttock although only 2 small open areas remain here 9/27; 2-week follow-up. We did not look at his left leg but the patient says everything is closed. He is a bit disturbed by the amount of edema in his left foot he is using juxta lite stockings but asking about over the toes stockings which would be 30/40, will talk to him next time. According to him there is no open wound on either the left foot or the left ankle/calf He has an open area on the dorsal right calf which I initially point a wrap injury. He has superficial remaining wound on the left ischial tuberosity been using silver alginate although he says this sticks to the wound 10/5; we gave him 2-week follow-up but he called yesterday expressing some concerns about his right foot right ankle and the left buttock. He came in early. There is still no open areas on the left leg and that still in his juxta lite stocking 10/11; he only has 1 small area on the left buttock that remains measuring millimeters 1  mm. Still has the same irritated skin in this area. We recommended zinc oxide when this eventually closes and pressure relief  is meticulously is he can do this. He still has an area on the dorsal part of his right first through third toes which is a bit irritated and still open and on the dorsal ankle near the crease of the ankle. We have been using silver alginate and using his own stocking. He has nothing open on the left leg or foot 10/25; 2-week follow-up. Not nearly as good on the left buttock as I was hoping. For open areas with 5 looking threatened small. He has the erythematous irritated chronic skin in this area. oo1 area on the right dorsal ankle. He reports this area bleeds easily ooRight dorsal foot just proximal to the base of his toes ooWe have been using silver alginate. 11/8; 2-week follow-up. Left buttock is about the same although I do not think the wounds are in the same location we have been using silver alginate. I have asked him to use zinc oxide on the skin around the wounds. ooHe still has a small area on the right dorsal ankle he reports this bleeds easily ooRight dorsal foot just proximal to the base of the toes does not have anything open although the skin is very dry and scaly ooHe has a new opening on the nailbed of the left great toe. Nothing on the left ankle 11/29; 3-week follow-up. Left buttock has 2 open areas. And washing of these wounds today started bleeding easily. Suggesting very friable tissue. We have been using silver alginate. Right dorsal ankle which I thought was initially a wrap injury we have been using silver alginate. Nothing open between the toes that I can see. He states the area on the left dorsal toe nailbed healed after the last visit in 2 or 3 days 12/13; 3-week follow-up. His left buttock now has 3 open areas but the original 2 areas are smaller using polymen here. Surrounding skin looks better. The right dorsal ankle is closed. He has a  small opening on the right dorsal foot at the level of the third toe. In general the skin looks better here. He is wearing his juxta lite stocking on the left leg says there is nothing open 11/24/2020; 3 weeks follow-up. His left buttock still has the 3 open areas. We have been using polymen but due to lack of response he changed to [facility] area. Surrounding skin is dry erythematous and irritated looking. There is no evidence of infection either bacterial or fungal however there is loss of surface epithelium ooHe still has very dry skin in his foot causing irritation and erythema on the dorsal part of his toes. This is not responded to prolonged courses of antifungal simply looks dry and irritated 1/24; left buttock area still looks about the same he was unable to find the triad ointment that we had suggested. The area on the right lower leg just above the dorsal ankle has reopened and the areas on the right foot between the first second and second third toes and scaling on the bottom of the foot has been about the same for quite some time now. been using silver alginate to all wound areas 2/7; left buttock wound looked quite good although not much smaller in terms of surface area surrounding skin looks better. Only a few dry flaking areas on the right foot in between the first and second toes the skin generally looks better here [ammonium lactate]. Finally the area on the right dorsal ankle is closed 2/21; ooThere is no open area on the right  foot even between the right first and second toe. Skin around this area dorsally and plantar aspects look better. ooHe has a reopening of the area on the right ankle just above the crease of the ankle dorsally. I continue to think that this is probably friction from spasms may be even this time with his stocking under the compression stockings. ooWounds on his left buttock look about the same there a couple of areas that have reopened. He has a total  square area of loss of epithelialization. This does not look like infection it looks like a contact dermatitis but I just cannot determine to what 3/14; there is nothing on the right foot between the first and second toes this was carefully inspected under illumination. Some chronic irritation on the dorsal part of his foot from toes 1-3 at the base. Nothing really open here substantially. Still has an area on the right foot/ankle that is actually larger and hyper granulated. His buttock area on the left is just about closed however he has chronic inflammation with loss of the surface epithelial layer 3/28; 2-week follow-up. In clinic today with a new wound on the left anterior mid tibia. Says this happened about 2 weeks ago. He is not really sure how wonders about the spasticity of his legs at night whether that could have caused this other than that he does not have a good idea. He has been using topical antibiotics and silver alginate. The area on his right dorsal ankle seems somewhat better. ooFinally everything on his left buttock is closed. 4/11; 2-week follow-up. All of his wounds are better except for the area over the ischium and left buttock which have opened up widely again. At least part of this is covered in necrotic fibrinous material another part had rolled nonviable skin. The area on the right ankle, left anterior mid tibia are both a lot better. He had no open wounds on either foot including the areas between the first and second toes 4/25; patient presents for 2-week follow-up. He states that the wounds are overall stable. He has no complaints today and states he is using Hydrofera Blue to open wounds. Patient History Information obtained from Patient. Family History Diabetes - Father, Hypertension - Mother, No family history of Cancer, Heart Disease, Hereditary Spherocytosis, Kidney Disease, Lung Disease, Stroke, Thyroid Problems, Tuberculosis. Social History Former smoker,  Marital Status - Married, Alcohol Use - Moderate, Drug Use - No History, Caffeine Use - Daily. Medical History Ear/Nose/Mouth/Throat Denies history of Chronic sinus problems/congestion, Middle ear problems Hematologic/Lymphatic Denies history of Anemia, Hemophilia, Human Immunodeficiency Virus, Lymphedema, Sickle Cell Disease Respiratory Patient has history of Sleep Apnea - does not tolerate cpap Denies history of Aspiration, Asthma, Chronic Obstructive Pulmonary Disease (COPD), Pneumothorax, Tuberculosis Cardiovascular Patient has history of Hypertension - lisinopril HCTZ Denies history of Angina, Arrhythmia, Congestive Heart Failure, Coronary Artery Disease, Deep Vein Thrombosis, Hypotension, Myocardial Infarction, Peripheral Arterial Disease, Peripheral Venous Disease, Phlebitis, Vasculitis Gastrointestinal Denies history of Cirrhosis , Colitis, Crohnoos, Hepatitis A, Hepatitis B, Hepatitis C Endocrine Denies history of Type I Diabetes, Type II Diabetes Genitourinary Denies history of End Stage Renal Disease Immunological Denies history of Lupus Erythematosus, Raynaudoos, Scleroderma Integumentary (Skin) Denies history of History of Burn Musculoskeletal Denies history of Gout, Rheumatoid Arthritis, Osteoarthritis, Osteomyelitis Neurologic Patient has history of Paraplegia Denies history of Dementia, Neuropathy, Quadriplegia, Seizure Disorder Oncologic Denies history of Received Chemotherapy, Received Radiation Psychiatric Denies history of Anorexia/bulimia, Confinement Anxiety Hospitalization/Surgery History - cellulitis in leg. - left  leg vein ablation. Objective Constitutional respirations regular, non-labored and within target range for patient.. Vitals Time Taken: 8:15 AM, Height: 70 in, Weight: 216 lbs, BMI: 31, Temperature: 97.4 F, Pulse: 121 bpm, Respiratory Rate: 18 breaths/min, Blood Pressure: 110/70 mmHg. Psychiatric pleasant and cooperative. General Notes:  The left ischium has a wound limited to skin breakdown. There are no signs of infection. Right dorsal ankle: Hypergranulation tissue present. No signs of infection. Left anterior lower leg: Small open wound with granulation tissue covered with a biofilm Integumentary (Hair, Skin) Wound #41R status is Open. Original cause of wound was Gradually Appeared. The date acquired was: 03/16/2020. The wound has been in treatment 52 weeks. The wound is located on the Left Ischium. The wound measures 2.5cm length x 2.5cm width x 0.1cm depth; 4.909cm^2 area and 0.491cm^3 volume. There is Fat Layer (Subcutaneous Tissue) exposed. There is no tunneling or undermining noted. There is a medium amount of serosanguineous drainage noted. The wound margin is distinct with the outline attached to the wound base. There is large (67-100%) red, pink, friable granulation within the wound bed. There is no necrotic tissue within the wound bed. Wound #50 status is Open. Original cause of wound was Gradually Appeared. The date acquired was: 01/12/2021. The wound has been in treatment 9 weeks. The wound is located on the Right,Dorsal Ankle. The wound measures 0.8cm length x 0.6cm width x 0.1cm depth; 0.377cm^2 area and 0.038cm^3 volume. There is Fat Layer (Subcutaneous Tissue) exposed. There is no tunneling or undermining noted. There is a medium amount of serosanguineous drainage noted. The wound margin is distinct with the outline attached to the wound base. There is large (67-100%) red, friable granulation within the wound bed. There is no necrotic tissue within the wound bed. Wound #51 status is Open. Original cause of wound was Trauma. The date acquired was: 01/26/2021. The wound has been in treatment 4 weeks. The wound is located on the Left,Anterior Lower Leg. The wound measures 0.5cm length x 0.7cm width x 0.1cm depth; 0.275cm^2 area and 0.027cm^3 volume. There is Fat Layer (Subcutaneous Tissue) exposed. There is no tunneling or  undermining noted. There is a medium amount of serosanguineous drainage noted. The wound margin is well defined and not attached to the wound base. There is large (67-100%) red, friable granulation within the wound bed. There is no necrotic tissue within the wound bed. 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 He has been using Hydrofera Blue to these wounds every other day. There is hypergranulation tissue present to the right lower extremity wound and this was chemically cauterized with silver nitrate. He tolerated this well. There is a film on the left lower extremity that was debrided with a curette. We will have him follow-up in 2 weeks Procedures Wound #51 Pre-procedure diagnosis of Wound #51 is a Trauma, Other located on the Left,Anterior Lower Leg . There was a Excisional Skin/Subcutaneous Tissue Debridement with a total area of 0.35 sq cm performed by Kalman Shan, DO. With the following instrument(s): Curette to remove Viable and Non-Viable tissue/material. Material removed includes Subcutaneous Tissue and Slough and. No specimens were taken. A time out was conducted at 08:35, prior to the start of the procedure. A Minimum  amount of bleeding was controlled with Pressure. The procedure was tolerated well with a pain level of 0 throughout and a pain level of 0 following the procedure. Post Debridement Measurements: 0.5cm length x 0.7cm width x 0.1cm depth; 0.027cm^3 volume. Character of Wound/Ulcer Post Debridement is improved. Post procedure Diagnosis Wound #51: Same as Pre-Procedure Wound #50 Pre-procedure diagnosis of Wound #50 is a Venous Leg Ulcer  located on the Right,Dorsal Ankle . An Chemical Cauterization procedure was performed by Geralyn Corwin, DO. Post procedure Diagnosis Wound #50: Same as Pre-Procedure Notes: using silver nitrate Plan Follow-up Appointments: Return Appointment in 2 weeks. Bathing/ Shower/ Hygiene: May shower and wash wound with soap and water. - on days that dressing is changed Edema Control - Lymphedema / SCD / Other: Elevate legs to the level of the heart or above for 30 minutes daily and/or when sitting, a frequency of: - throughout the day Compression stocking or Garment 30-40 mm/Hg pressure to: - Juxtalite to both legs daily Off-Loading: Roho cushion for wheelchair Turn and reposition every 2 hours 1. Hydrofera Blue to all wounds 2. Juxta lite to both legs daily 3. Silver nitrate to the right lower extremity 4. In office sharp debridement to the left lower extremity 5. Follow-up in 2 weeks Electronic Signature(s) Signed: 03/16/2021 10:23:44 AM By: Geralyn Corwin DO Entered By: Geralyn Corwin on 03/16/2021 10:01:42 -------------------------------------------------------------------------------- HxROS Details Patient Name: Date of Service: Jentz, [name] 03/16/2021 8:15 A M Medical Record Number: [id number] Patient Account Number: [id number] Date of Birth/Sex: Treating RN: 1986-07-16 (34 y.o. Damaris Schooner Primary Care Provider: [name], [name] Other Clinician: Referring Provider: Treating Provider/Extender: Adine Madura [name]: 271 Information Obtained From Patient Ear/Nose/Mouth/Throat Medical History: Negative for: Chronic sinus problems/congestion; Middle ear problems Hematologic/Lymphatic Medical History: Negative for: Anemia; Hemophilia; Human Immunodeficiency Virus; Lymphedema; Sickle Cell Disease Respiratory Medical History: Positive for: Sleep Apnea - does not tolerate cpap Negative for: Aspiration; Asthma; Chronic Obstructive Pulmonary  Disease (COPD); Pneumothorax; Tuberculosis Cardiovascular Medical History: Positive for: Hypertension - lisinopril HCTZ Negative for: Angina; Arrhythmia; Congestive Heart Failure; Coronary Artery Disease; Deep Vein Thrombosis; Hypotension; Myocardial Infarction; Peripheral Arterial Disease; Peripheral Venous Disease; Phlebitis; Vasculitis Gastrointestinal Medical History: Negative for: Cirrhosis ; Colitis; Crohns; Hepatitis A; Hepatitis B; Hepatitis C Endocrine Medical History: Negative for: Type I Diabetes; Type II Diabetes Genitourinary Medical History: Negative for: End Stage Renal Disease Immunological Medical History: Negative for: Lupus Erythematosus; Raynauds; Scleroderma Integumentary (Skin) Medical History: Negative for: History of Burn Musculoskeletal Medical History: Negative for: Gout; Rheumatoid Arthritis; Osteoarthritis; Osteomyelitis Neurologic Medical History: Positive for: Paraplegia Negative for: Dementia; Neuropathy; Quadriplegia; Seizure Disorder Oncologic Medical History: Negative for: Received Chemotherapy; Received Radiation Psychiatric Medical History: Negative for: Anorexia/bulimia; Confinement Anxiety Immunizations Pneumococcal Vaccine: Received Pneumococcal Vaccination: No Immunization Notes: doesn't remember when last tetanus shot was Implantable Devices No devices added Hospitalization / Surgery History Type of Hospitalization/Surgery cellulitis in leg left leg vein ablation Family and Social History Cancer: No; Diabetes: Yes - Father; Heart Disease: No; Hereditary Spherocytosis: No; Hypertension: Yes - Mother; Kidney Disease: No; Lung Disease: No; Stroke: No; Thyroid Problems: No; Tuberculosis: No; Former smoker; Marital Status - Married; Alcohol Use: Moderate; Drug Use: No History; Caffeine Use: Daily; Financial Concerns: No; Food, Clothing or Shelter Needs: No; Support System Lacking: No; Transportation Concerns: No Electronic  Signature(s) Signed: 03/16/2021 10:23:44 AM By: Geralyn Corwin DO Signed: 03/16/2021 4:57:45 PM By: Zenaida Deed RN, BSN Entered By: Geralyn Corwin on 03/16/2021 09:59:12 -------------------------------------------------------------------------------- SuperBill Details Patient Name: Date  of Service: Cahalan, [name] 03/16/2021 Medical Record Number: [id number] Patient Account Number: [id number] Date of Birth/Sex: Treating RN: [birth year] (35 y.o. [name] Primary Care Provider: [place], [place] Other Clinician: Referring Provider: Treating Provider/Extender: Carmell Austria [name]: 271 Diagnosis Coding ICD-10 Codes Code Description I87.332 Chronic venous hypertension (idiopathic) with ulcer and inflammation of left lower extremity L97.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, 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, complete Facility Procedures CPT4 Code: 07371062 Description: 69485 - DEB SUBQ TISSUE 20 SQ CM/< Modifier: Quantity: 1 CPT4 Code: 46270350 Description: 09381 - CHEM CAUT GRANULATION TISS Modifier: 59 Quantity: 1 Electronic Signature(s) Signed: 03/16/2021 10:23:44 AM By: Kalman Shan DO Entered By: Kalman Shan on 03/16/2021 10:22:40

## 2021-03-16 NOTE — Progress Notes (Addendum)
Brosh, PHELIX FUDALA ([id number]) Visit Report for 03/16/2021 Arrival Information Details Patient Name: Date of Service: Dralle, [name] 03/16/2021 8:15 A M Medical Record Number: [id number] Patient Account Number: [id number] Date of Birth/Sex: Treating RN: 10/05/1986 (35 y.o. [name] Primary Care Adonis Ryther: [place], [place] Other Clinician: Referring Lanice Folden: Treating Lenaya Pietsch/Extender: Carmell Austria [name]: 62 Visit Information History Since Last Visit Added or deleted any medications: No Patient Arrived: Wheel Chair Any new allergies or adverse reactions: No Arrival Time: 08:15 Had a fall or experienced change in No Transfer Assistance: None activities of daily living that may affect Patient Identification Verified: Yes risk of falls: Secondary Verification Process Completed: Yes Signs or symptoms of abuse/neglect since last visito No Patient Requires Transmission-Based Precautions: No Hospitalized since last visit: No Patient Has Alerts: Yes Implantable device outside of the clinic excluding No Patient Alerts: R ABI = 1.0 cellular tissue based products placed in the center L ABI = 1.1 since last visit: Has Dressing in Place as Prescribed: Yes Has Compression in Place as Prescribed: Yes Pain Present Now: No Electronic Signature(s) Signed: 03/16/2021 4:00:30 PM By: Lorrin Jackson Entered By: Lorrin Jackson on 03/16/2021 08:15:45 -------------------------------------------------------------------------------- Encounter Discharge Information Details Patient Name: Date of Service: [place], [name] 03/16/2021 8:15 A M Medical Record Number: [id number] Patient Account Number: [id number] Date of Birth/Sex: Treating RN: 1985/12/15 (35 y.o. [name] Primary Care Lanae Federer: [place], [place] Other Clinician: Referring Jailah Willis: Treating Karolee Meloni/Extender: Carmell Austria [name]: 52 Encounter Discharge Information Items  Post Procedure Vitals Discharge Condition: Stable Temperature (F): 97.4 Ambulatory Status: Wheelchair Pulse (bpm): 121 Discharge Destination: Home Respiratory Rate (breaths/min): 18 Transportation: Private Auto Blood Pressure (mmHg): 110/70 Schedule Follow-up Appointment: Yes Clinical Summary of Care: Provided on 03/16/2021 Form Type Recipient Paper Patient Patient Electronic Signature(s) Signed: 03/16/2021 8:45:33 AM By: Lorrin Jackson Previous Signature: 03/16/2021 8:44:36 AM Version By: Lorrin Jackson Entered By: Lorrin Jackson on 03/16/2021 08:45:33 -------------------------------------------------------------------------------- Lower Extremity Assessment Details Patient Name: Date of Service: Mickiewicz, [name] 03/16/2021 8:15 A M Medical Record Number: [id number] Patient Account Number: [id number] Date of Birth/Sex: Treating RN: [birth year] (35 y.o. [name] Primary Care Robinette Esters: [place], [place] Other Clinician: Referring Nur Krasinski: Treating Breea Loncar/Extender: Nena Alexander, [name]: 271 Edema Assessment Assessed: [Left: Yes] [Right: Yes] Edema: [Left: Yes] [Right: Yes] Calf Left: Right: Point of Measurement: 33 cm From Medial Instep 27.5 cm 33 cm Ankle Left: Right: Point of Measurement: 10 cm From Medial Instep 23 cm 23.5 cm Vascular Assessment Pulses: Dorsalis Pedis Palpable: [Left:Yes] [Right:Yes] Electronic Signature(s) Signed: 03/16/2021 4:00:30 PM By: Lorrin Jackson Entered By: Lorrin Jackson on 03/16/2021 08:21:38 -------------------------------------------------------------------------------- Multi Wound Chart Details Patient Name: Date of Service: [place]ise, [name] 03/16/2021 8:15 A M Medical Record Number: [id number] Patient Account Number: [id number] Date of Birth/Sex: Treating RN: 1986/04/04 (35 y.o. [name] Primary Care Marijo Quizon: [name], [name] Other Clinician: Referring Nichoals Heyde: Treating Analy Bassford/Extender:  Nena Alexander, [name]: 271 Vital Signs Height(in): 70 Pulse(bpm): 121 Weight(lbs): 216 Blood Pressure(mmHg): 110/70 Body Mass Index(BMI): 31 Temperature(F): 97.4 Respiratory Rate(breaths/min): 18 Photos: [41R:No Photos Left Ischium] [50:No Photos Right, Dorsal Ankle] [51:No Photos Left, Anterior Lower Leg] Wound Location: [41R:Gradually Appeared] [50:Gradually Appeared] [51:Trauma] Wounding Event: [41R:Pressure Ulcer] [50:Venous Leg Ulcer] [51:Trauma, Other] Primary Etiology: [41R:Sleep Apnea, Hypertension, Paraplegia Sleep Apnea, Hypertension, Paraplegia Sleep Apnea, Hypertension, Paraplegia] Comorbid History: [41R:03/16/2020] [50:01/12/2021] [51:01/26/2021] Date Acquired: [41R:52] [50:9] [51:4] Weeks of Treatment: [41R:Open] [50:Open] [51:Open] Wound Status: [41R:Yes] [  50:No] [51:No] Wound Recurrence: [41R:Yes] [50:No] [51:No] Clustered Wound: [41R:2] [50:N/A] [51:N/A] Clustered Quantity: [41R:2.5x2.5x0.1] [50:0.8x0.6x0.1] [51:0.5x0.7x0.1] Measurements L x W x D (cm) [41R:4.909] [50:0.377] [51:0.275] A (cm) : rea [41R:0.491] [50:0.038] [51:0.027] Volume (cm) : [41R:-171.80%] [50:80.00%] [51:16.70%] % Reduction in Area: [41R:-171.30%] [50:89.90%] [51:18.20%] % Reduction in Volume: [41R:Category/Stage II] [50:Full Thickness Without Exposed] [51:Full Thickness Without Exposed] Classification: [41R:Medium] [50:Support Structures Medium] [51:Support Structures Medium] Exudate A mount: [41R:Serosanguineous] [50:Serosanguineous] [51:Serosanguineous] Exudate Type: [41R:red, brown] [50:red, brown] [51:red, brown] Exudate Color: [41R:Distinct, outline attached] [50:Distinct, outline attached] [51:Well defined, not attached] Wound Margin: [41R:Large (67-100%)] [50:Large (67-100%)] [51:Large (67-100%)] Granulation A mount: [41R:Red, Pink, Friable] [50:Red, Friable] [51:Red, Friable] Granulation Quality: [41R:None Present (0%)] [50:None Present (0%)] [51:None  Present (0%)] Necrotic A mount: [41R:Fat Layer (Subcutaneous Tissue): Yes Fat Layer (Subcutaneous Tissue): Yes Fat Layer (Subcutaneous Tissue): Yes] Exposed Structures: [41R:Fascia: No Tendon: No Muscle: No Joint: No Bone: No Small (1-33%)] [50:Fascia: No Tendon: No Muscle: No Joint: No Bone: No Medium (34-66%)] [51:Fascia: No Tendon: No Muscle: No Joint: No Bone: No Small (1-33%)] Epithelialization: [41R:N/A] [50:N/A] [51:Debridement - Excisional] Debridement: Pre-procedure Verification/Time Out N/A [50:N/A] [51:08:35] Taken: [41R:N/A] [50:N/A] [51:Subcutaneous, Slough] Tissue Debrided: [41R:N/A] [50:N/A] [51:Skin/Subcutaneous Tissue] Level: [41R:N/A] [50:N/A] [51:0.35] Debridement A (sq cm): [41R:rea N/A] [50:N/A] [51:Curette] Instrument: [41R:N/A] [50:N/A] [51:Minimum] Bleeding: [41R:N/A] [50:N/A] [51:Pressure] Hemostasis A chieved: [41R:N/A] [50:N/A] [51:0] Procedural Pain: [41R:N/A] [50:N/A] [51:0] Post Procedural Pain: [41R:N/A] [50:N/A] [51:Procedure was tolerated well] Debridement Treatment Response: [41R:N/A] [50:N/A] [51:0.5x0.7x0.1] Post Debridement Measurements L x W x D (cm) [41R:N/A] [50:N/A] [51:0.027] Post Debridement Volume: (cm) [41R:N/A] [50:Chemical Cauterization] [51:Debridement] Treatment Notes Wound #41R (Ischium) Wound Laterality: Left Cleanser Soap and Water Discharge Instruction: May shower and wash wound with dial antibacterial soap and water prior to dressing change. Peri-Wound Care Topical Primary Dressing Hydrofera Blue Ready Foam, 4x5 in Discharge Instruction: Apply to wound bed as instructed Secondary Dressing ComfortFoam Border, 4x4 in (silicone border) Discharge Instruction: Apply over primary dressing as directed. Secured With Compression Wrap Compression Stockings Add-Ons Wound #50 (Ankle) Wound Laterality: Dorsal, Right Cleanser Wound Cleanser Discharge Instruction: Cleanse the wound with wound cleanser prior to applying a clean dressing  using gauze sponges, not tissue or cotton balls. Soap and Water Discharge Instruction: May shower and wash wound with dial antibacterial soap and water prior to dressing change. Peri-Wound Care Topical Primary Dressing Hydrofera Blue Ready Foam, 2.5 x2.5 in Discharge Instruction: Apply to wound bed as instructed Secondary Dressing ComfortFoam Border, 4x4 in (silicone border) Discharge Instruction: Apply over primary dressing as directed. Secured With Compression Wrap Compression Stockings Add-Ons Wound #51 (Lower Leg) Wound Laterality: Left, Anterior Cleanser Wound Cleanser Discharge Instruction: Cleanse the wound with wound cleanser prior to applying a clean dressing using gauze sponges, not tissue or cotton balls. Soap and Water Discharge Instruction: May shower and wash wound with dial antibacterial soap and water prior to dressing change. Peri-Wound Care Topical Primary Dressing Hydrofera Blue Ready Foam, 2.5 x2.5 in Discharge Instruction: Apply to wound bed as instructed Secondary Dressing ComfortFoam Border, 4x4 in (silicone border) Discharge Instruction: Apply over primary dressing as directed. Secured With Compression Wrap Compression Stockings Environmental education officer) Signed: 03/16/2021 10:23:44 AM By: Kalman Shan DO Signed: 03/16/2021 4:57:45 PM By: Baruch Gouty RN, BSN Entered By: Kalman Shan on 03/16/2021 09:54:33 -------------------------------------------------------------------------------- Multi-Disciplinary Care Plan Details Patient Name: Date of Service: [place], [name] 03/16/2021 8:15 A M Medical Record Number: [id number] Patient Account Number: [id number] Date of Birth/Sex: Treating RN: [birth year] (35 y.o. [name] Primary Care Raevon Broom:  [name], [name] Other Clinician: Referring Yuval Rubens: Treating Betti Goodenow/Extender: Carmell Austria [name]: [place] reviewed with  physician Active Inactive Wound/Skin Impairment Nursing Diagnoses: Impaired tissue integrity Knowledge deficit related to ulceration/compromised skin integrity Goals: Patient/caregiver will verbalize understanding of skin care regimen Date Initiated: 01/05/2016 Target Resolution Date: 04/13/2021 Goal Status: Active Ulcer/skin breakdown will have a volume reduction of 30% by week 4 Date Initiated: 01/05/2016 Date Inactivated: 12/22/2017 Target Resolution Date: 01/19/2018 Unmet Reason: complex wounds, Goal Status: Unmet infection Interventions: Assess patient/caregiver ability to obtain necessary supplies Assess ulceration(s) every visit Provide education on ulcer and skin care Notes: 02/02/21: Complex Care, ongoing. Electronic Signature(s) Signed: 03/16/2021 4:57:45 PM By: Baruch Gouty RN, BSN Entered By: Baruch Gouty on 03/16/2021 08:37:54 -------------------------------------------------------------------------------- Pain Assessment Details Patient Name: Date of Service: Dunckel, [name] 03/16/2021 8:15 A M Medical Record Number: [id number] Patient Account Number: [id number] Date of Birth/Sex: Treating RN: September 26, 1986 (35 y.o. [name] Primary Care Zsofia Prout: [place], [place] Other Clinician: Referring Wai Litt: Treating Destan Franchini/Extender: Carmell Austria [name]: 271 Active Problems Location of Pain Severity and Description of Pain Patient Has Paino No Site Locations Pain Management and Medication Current Pain Management: Electronic Signature(s) Signed: 03/16/2021 4:00:30 PM By: Lorrin Jackson Entered By: Lorrin Jackson on 03/16/2021 08:16:37 -------------------------------------------------------------------------------- Patient/Caregiver Education Details Patient Name: Date of Service: [place], A Viviann Spare 4/25/2022andnbsp8:15 A M Medical Record Number: [id number] Patient Account Number: [id number] Date of Birth/Gender: Treating  RN: 08/17/1986 (35 y.o. [name] Primary Care Physician: Janine Limbo Other Clinician: Referring Physician: Treating Physician/Extender: Carmell Austria [name]: 271 Education Assessment Education Provided To: Patient Education Topics Provided Pressure: Methods: Explain/Verbal Responses: Reinforcements needed, State content correctly Wound/Skin Impairment: Methods: Explain/Verbal Responses: Reinforcements needed, State content correctly Electronic Signature(s) Signed: 03/16/2021 4:57:45 PM By: Baruch Gouty RN, BSN Entered By: Baruch Gouty on 03/16/2021 08:39:06 -------------------------------------------------------------------------------- Wound Assessment Details Patient Name: Date of Service: Saine, [name] 03/16/2021 8:15 A M Medical Record Number: [id number] Patient Account Number: [id number] Date of Birth/Sex: Treating RN: [birth year] (35 y.o. [name] Primary Care Story Vanvranken: [place], [place] Other Clinician: Referring Zoii Florer: Treating Alleta Avery/Extender: Nena Alexander, [name]: 271 Wound Status Wound Number: [id number] Primary Etiology: Pressure Ulcer Wound Location: Left Ischium Wound Status: Open Wounding Event: Gradually Appeared Comorbid History: Sleep Apnea, Hypertension, Paraplegia Date Acquired: 03/16/2020 Weeks Of Treatment: 52 Clustered Wound: Yes Photos Wound Measurements Length: (cm) 2.5 Width: (cm) 2.5 Depth: (cm) 0.1 Clustered Quantity: 2 Area: (cm) 4.909 Volume: (cm) 0.491 % Reduction in Area: -171.8% % Reduction in Volume: -171.3% Epithelialization: Small (1-33%) Tunneling: No Undermining: No Wound Description Classification: Category/Stage II Wound Margin: Distinct, outline attached Exudate Amount: Medium Exudate Type: Serosanguineous Exudate Color: red, brown Foul Odor After Cleansing: No Slough/Fibrino No Wound Bed Granulation Amount: Large (67-100%) Exposed  Structure Granulation Quality: Red, Pink, Friable Fascia Exposed: No Necrotic Amount: None Present (0%) Fat Layer (Subcutaneous Tissue) Exposed: Yes Tendon Exposed: No Muscle Exposed: No Joint Exposed: No Bone Exposed: No Treatment Notes Wound #41R (Ischium) Wound Laterality: Left Cleanser Soap and Water Discharge Instruction: May shower and wash wound with dial antibacterial soap and water prior to dressing change. Peri-Wound Care Topical Primary Dressing Hydrofera Blue Ready Foam, 4x5 in Discharge Instruction: Apply to wound bed as instructed Secondary Dressing ComfortFoam Border, 4x4 in (silicone border) Discharge Instruction: Apply over primary dressing as directed. Secured With Compression Wrap Compression Stockings Environmental education officer) Signed: 03/17/2021 5:14:41 PM By: Rudean Curt,  Destiny Signed: 03/17/2021 5:25:58 PM By: Lorrin Jackson Previous Signature: 03/16/2021 4:00:30 PM Version By: Lorrin Jackson Entered By: Sandre Kitty on 03/17/2021 16:08:33 -------------------------------------------------------------------------------- Wound Assessment Details Patient Name: Date of Service: [place], [name] 03/16/2021 8:15 A M Medical Record Number: [id number] Patient Account Number: [id number] Date of Birth/Sex: Treating RN: July 02, 1986 (35 y.o. [name] Primary Care Keona Sheffler: [name], [name] Other Clinician: Referring Vennela Jutte: Treating Merve Hotard/Extender: Nena Alexander, [name]: 271 Wound Status Wound Number: 50 Primary Etiology: Venous Leg Ulcer Wound Location: Right, Dorsal Ankle Wound Status: Open Wounding Event: Gradually Appeared Comorbid History: Sleep Apnea, Hypertension, Paraplegia Date Acquired: 01/12/2021 Weeks Of Treatment: 9 Clustered Wound: No Photos Wound Measurements Length: (cm) 0.8 Width: (cm) 0.6 Depth: (cm) 0.1 Area: (cm) 0.377 Volume: (cm) 0.038 % Reduction in Area: 80% % Reduction in Volume:  89.9% Epithelialization: Medium (34-66%) Tunneling: No Undermining: No Wound Description Classification: Full Thickness Without Exposed Support Structures Wound Margin: Distinct, outline attached Exudate Amount: Medium Exudate Type: Serosanguineous Exudate Color: red, brown Foul Odor After Cleansing: No Slough/Fibrino No Wound Bed Granulation Amount: Large (67-100%) Exposed Structure Granulation Quality: Red, Friable Fascia Exposed: No Necrotic Amount: None Present (0%) Fat Layer (Subcutaneous Tissue) Exposed: Yes Tendon Exposed: No Muscle Exposed: No Joint Exposed: No Bone Exposed: No Treatment Notes Wound #50 (Ankle) Wound Laterality: Dorsal, Right Cleanser Wound Cleanser Discharge Instruction: Cleanse the wound with wound cleanser prior to applying a clean dressing using gauze sponges, not tissue or cotton balls. Soap and Water Discharge Instruction: May shower and wash wound with dial antibacterial soap and water prior to dressing change. Peri-Wound Care Topical Primary Dressing Hydrofera Blue Ready Foam, 2.5 x2.5 in Discharge Instruction: Apply to wound bed as instructed Secondary Dressing ComfortFoam Border, 4x4 in (silicone border) Discharge Instruction: Apply over primary dressing as directed. Secured With Compression Wrap Compression Stockings Environmental education officer) Signed: 03/17/2021 5:14:41 PM By: Sandre Kitty Signed: 03/17/2021 5:25:58 PM By: Lorrin Jackson Previous Signature: 03/16/2021 4:00:30 PM Version By: Lorrin Jackson Entered By: Sandre Kitty on 03/17/2021 16:07:11 -------------------------------------------------------------------------------- Wound Assessment Details Patient Name: Date of Service: [place]en, [name] 03/16/2021 8:15 A M Medical Record Number: [id number] Patient Account Number: [id number] Date of Birth/Sex: Treating RN: 1986/01/09 (35 y.o. [name] Primary Care Cambri Plourde: [name], [name] Other  Clinician: Referring Susie Pousson: Treating Lular Letson/Extender: Nena Alexander, [name]: 271 Wound Status Wound Number: 51 Primary Etiology: Trauma, Other Wound Location: Left, Anterior Lower Leg Wound Status: Open Wounding Event: Trauma Comorbid History: Sleep Apnea, Hypertension, Paraplegia Date Acquired: 01/26/2021 Weeks Of Treatment: 4 Clustered Wound: No Photos Wound Measurements Length: (cm) 0.5 Width: (cm) 0.7 Depth: (cm) 0.1 Area: (cm) 0.275 Volume: (cm) 0.027 % Reduction in Area: 16.7% % Reduction in Volume: 18.2% Epithelialization: Small (1-33%) Tunneling: No Undermining: No Wound Description Classification: Full Thickness Without Exposed Support Structures Wound Margin: Well defined, not attached Exudate Amount: Medium Exudate Type: Serosanguineous Exudate Color: red, brown Foul Odor After Cleansing: No Slough/Fibrino No Wound Bed Granulation Amount: Large (67-100%) Exposed Structure Granulation Quality: Red, Friable Fascia Exposed: No Necrotic Amount: None Present (0%) Fat Layer (Subcutaneous Tissue) Exposed: Yes Tendon Exposed: No Muscle Exposed: No Joint Exposed: No Bone Exposed: No Treatment Notes Wound #51 (Lower Leg) Wound Laterality: Left, Anterior Cleanser Wound Cleanser Discharge Instruction: Cleanse the wound with wound cleanser prior to applying a clean dressing using gauze sponges, not tissue or cotton balls. Soap and Water Discharge Instruction: May shower and wash wound with dial antibacterial soap and water  prior to dressing change. Peri-Wound Care Topical Primary Dressing Hydrofera Blue Ready Foam, 2.5 x2.5 in Discharge Instruction: Apply to wound bed as instructed Secondary Dressing ComfortFoam Border, 4x4 in (silicone border) Discharge Instruction: Apply over primary dressing as directed. Secured With Compression Wrap Compression Stockings Environmental education officer) Signed: 03/17/2021 5:14:41 PM By:  Sandre Kitty Signed: 03/17/2021 5:25:58 PM By: Lorrin Jackson Previous Signature: 03/16/2021 4:00:30 PM Version By: Lorrin Jackson Entered By: Sandre Kitty on 03/17/2021 16:07:54 -------------------------------------------------------------------------------- Vitals Details Patient Name: Date of Service: [place], [name] 03/16/2021 8:15 A M Medical Record Number: [id number] Patient Account Number: [id number] Date of Birth/Sex: Treating RN: [birth year] (35 y.o. [name] Primary Care Briaunna Grindstaff: [place], [place] Other Clinician: Referring Chontel Warning: Treating Abimbola Aki/Extender: Nena Alexander, [name]: 271 Vital Signs Time Taken: 08:15 Temperature (F): 97.4 Height (in): 70 Pulse (bpm): 121 Weight (lbs): 216 Respiratory Rate (breaths/min): 18 Body Mass Index (BMI): 31 Blood Pressure (mmHg): 110/70 Reference Range: 80 - 120 mg / dl Electronic Signature(s) Signed: 03/16/2021 4:00:30 PM By: Lorrin Jackson Entered By: Lorrin Jackson on 03/16/2021 08:16:09

## 2021-03-30 ENCOUNTER — Encounter (HOSPITAL_BASED_OUTPATIENT_CLINIC_OR_DEPARTMENT_OTHER): Payer: 59 | Attending: Internal Medicine | Admitting: Internal Medicine

## 2021-03-30 ENCOUNTER — Other Ambulatory Visit: Payer: Self-pay

## 2021-03-30 DIAGNOSIS — L97311 Non-pressure chronic ulcer of right ankle limited to breakdown of skin: Secondary | ICD-10-CM | POA: Diagnosis not present

## 2021-03-30 DIAGNOSIS — L97511 Non-pressure chronic ulcer of other part of right foot limited to breakdown of skin: Secondary | ICD-10-CM | POA: Diagnosis not present

## 2021-03-30 DIAGNOSIS — Z87891 Personal history of nicotine dependence: Secondary | ICD-10-CM | POA: Diagnosis not present

## 2021-03-30 DIAGNOSIS — L97829 Non-pressure chronic ulcer of other part of left lower leg with unspecified severity: Secondary | ICD-10-CM | POA: Diagnosis present

## 2021-03-30 DIAGNOSIS — L89323 Pressure ulcer of left buttock, stage 3: Secondary | ICD-10-CM | POA: Diagnosis not present

## 2021-03-30 DIAGNOSIS — L97821 Non-pressure chronic ulcer of other part of left lower leg limited to breakdown of skin: Secondary | ICD-10-CM | POA: Diagnosis not present

## 2021-03-30 DIAGNOSIS — G8221 Paraplegia, complete: Secondary | ICD-10-CM | POA: Insufficient documentation

## 2021-03-30 DIAGNOSIS — I87332 Chronic venous hypertension (idiopathic) with ulcer and inflammation of left lower extremity: Secondary | ICD-10-CM | POA: Diagnosis not present

## 2021-03-30 NOTE — Progress Notes (Signed)
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 Problematic areas on the plantar aspect of the first second toe web space on the left and the dorsal aspect of the first second toe web space on the right. At one point I felt these were probably related to chronic fungal infections in treated him aggressively for this although we have not made any improvement here. 08/11/17; left lateral leg. Surface continues to improve with the Iodoflex although we are not seeing much improvement in overall wound dimensions. Areas on his plantar left foot and right foot show no improvement. In fact the right foot looks somewhat worse 08/18/17; left lateral leg. We changed to [facility] Blue last week after [name] prolonged course of Iodoflex which helps get the surface better. It appears that the wound with is improved. Continue with difficult areas on the left dorsal first second and plantar first second on the right  09/01/17; patient arrives in clinic today having had [name] temperature of 103 yesterday. He was seen in the ER and [facility]. The patient was concerned he could have cellulitis again in the right leg however they diagnosed him with [name] UTI and he is now on Keflex. He has [name] history of cellulitis which is been recurrent and difficult but this is been in the left leg, in the past 5 use doxycycline. He does in and out catheterizations at home which are risk factors for UTI 09/08/17; patient will be completing his Keflex this weekend. The erythema on the left leg is considerably better. He has [name] new  wound today on the medial part of the right leg small superficial almost looks like [name] skin tear. He has worsening of the area on the right dorsal first and second toe. His major area on the left lateral leg is better. Using Hydrofera Blue on all areas 09/15/17; gradual reduction in width on the long wound in the left lateral leg. No debridement required. He also has wounds on the plantar aspect of his left first second toe web space and on the dorsal aspect of the right first second toe web space. 09/22/17; there continues to be very gradual improvements in the dimensions of the left lateral leg wound. He hasn't round erythematous spot with might be pressure on his wheelchair. There is no evidence obviously of infection no purulence no warmth He has [name] dry scaled area on the plantar aspect of the left first second toe Improved area on the dorsal right first second toe. 09/29/17; left lateral leg wound continues to improve in dimensions mostly with an is still [name] fairly long but increasingly narrow wound. He has [name] dry scaled area on the plantar aspect of his left first second toe web space Increasingly concerning area on the dorsal right first second toe. In fact I am concerned today about possible cellulitis around this wound. The areas extending up his second toe and although there is deformities here almost appears to abut on the nailbed. 10/06/17; left lateral leg wound continues to make very gradual progress. Tissue culture I did from the right first second toe dorsal foot last time grew MRSA and enterococcus which was vancomycin sensitive. This was not sensitive to clindamycin or doxycycline. He is allergic to Zyvox and sulfa we have therefore arrange for him to have dalvance infusion tomorrow. He is had this in the past and tolerated it well 10/20/17; left lateral leg wound continues to make decent progress. This is certainly reduced in terms of with there is advancing epithelialization.The  cellulitis in the right foot looks better although he still has [name] deep wound in the dorsal aspect of the first second toe web space. Plantar left first toe web space on the left I think is making some progress 10/27/17; left lateral leg wound continues to make decent progress. Advancing epithelialization.using Hydrofera Blue The right first second toe web space wound is better-looking using silver alginate Improvement in the left plantar first second toe web space. Again using silver alginate 11/03/17 left lateral leg wound continues to make decent progress albeit slowly. Using [facility] The right per second toe web space continues to be [name] very problematic looking punched out wound. I obtained [name] piece of tissue for deep culture I did extensively treated this for fungus. It is difficult to imagine that this is [name] pressure area as the patient states other than going outside he doesn't really wear shoes at home The  left plantar first second toe web space looked fairly senescent. Necrotic edges. This required debridement change to [facility] Blue to all wound areas 11/10/17; left lateral leg wound continues to contract. Using Hydrofera Blue On the right dorsal first second toe web space dorsally. Culture I did of this area last week grew MRSA there is not an easy oral option in this patient was multiple antibiotic allergies or intolerances. This was only [name] rare culture isolate I'm therefore going to use Bactroban under silver alginate On the left plantar first second toe web space. Debridement is required here. This is also unchanged 11/17/17; left lateral leg wound continues to contract using Hydrofera Blue this is no longer the major issue. The major concern here is the right first second toe web space. He now has an open area going from dorsally to the plantar aspect. There is now wound on the inner lateral part of the first toe. Not [name] very viable surface on this. There is erythema spreading  medially into the forefoot. No major change in the left first second toe plantar wound 11/24/17; left lateral leg wound continues to contract using Hydrofera Blue. Nice improvement today The right first second toe web space all of this looks [name] lot less angry than last week. I have given him clindamycin and topical Bactroban for MRSA and terbinafine for the possibility of underlining tinea pedis that I could not control with ketoconazole. Looks somewhat better The area on the plantar left first second toe web space is weeping with dried debris around the wound 12/01/17; left lateral leg wound continues to contract he Hydrofera Blue. It is becoming thinner in terms of with nevertheless it is making good improvement. The right first second toe web space looks less angry but still [name] large necrotic-looking wounds starting on the plantar aspect of the right foot extending between the toes and now extensively on the base of the right second toe. I gave him clindamycin and topical Bactroban for MRSA anterior benefiting for the possibility of underlying tinea pedis. Not looking better today The area on the left first/second toe looks better. Debrided of necrotic debris 12/05/17* the patient was worked in urgently today because over the weekend he found blood on his incontinence bad when he woke up. He was found to have an ulcer by his wife who does most of his wound care. He came in today for [place] to look at this. He has not had [name] history of wounds in his buttocks in spite of his paraplegia. 12/08/17; seen in follow-up today at his usual appointment. He was seen earlier this week and found to have [name] new wound on his buttock. We also follow him for wounds on the left lateral leg, left first second toe web space and right first second toe web space 12/15/17; we have been using Hydrofera Blue to the left lateral leg which has improved. The right first second toe web space has also improved. Left first second toe web  space plantar aspect looks stable. The left buttock has worsened using Santyl. Apparently the buttock has drainage 12/22/17; we have been using Hydrofera Blue to the left lateral leg which continues to improve now 2 small wounds separated by normal skin. He tells [place] he had [name] fever up to 100 yesterday he is prone to UTIs but has not noted anything different. He does in and out catheterizations. The area between the first and second toes today does not look good necrotic surface covered with what looks  to be purulent drainage and erythema extending into the third toe. I had gotten this to something that I thought look better last time however it is not look good today. He also has [name] necrotic surface over the buttock wound which is expanded. I thought there might be infection under here so I removed [name] lot of the surface with [name] #5 curet though nothing look like it really needed culturing. He is been using Santyl to this area 12/27/17; his original wound on the left lateral leg continues to improve using Hydrofera Blue. I gave him samples of Baxdella although he was unable to take them out of fear for an allergic reaction ["lump in his throat"].the culture I did of the purulent drainage from his second toe last week showed both enterococcus and [name] set Enterobacter I was also concerned about the erythema on the bottom of his foot although paradoxically although this looks somewhat better today. 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 We've been using silver alginate here. They came up today that he sleeps in [name] recliner, I'm not sure why but I asked him to stop this 01/03/18; his original wound we've been using Hydrofera Blue is now separated into 2 areas. Ulcer on his left buttock is better he is off the recliner and sleeping in bed Finally both wound areas between his first and second toes also looks some  better 01/10/18; his original wound on the left lateral leg is now separated into 2 wounds we've been using Hydrofera Blue Ulcer on his left buttock has some drainage. There is [name] small probing site going into muscle layer superiorly.using silver alginate -He arrives today with [name] deep tissue injury on the left heel The wound on the dorsal aspect of his first second toe on the left looks [name] lot betterusing silver alginate ketoconazole The area on the first second toe web space on the right also looks [name] lot bette 01/17/18; his original wound on the left lateral leg continues to progress using Hydrofera Blue Ulcer on his left buttock also is smaller surface healthier except for [name] small probing site going into the muscle layer superiorly. 2.4 cm of tunneling in this area DTI on his left heel we have only been offloading. Looks better than last week no threatened open no evidence of infection the wound on the dorsal aspect of the first second toe on the left continues to look like it's regressing we have only been using silver alginate and terbinafine orally The area in the first second toe web space on the right also looks to be [name] lot better using silver alginate and terbinafine I think this was prompted by tinea pedis 01/31/18; the patient was hospitalized in [place] last week apparently for [name] complicated UTI. He was discharged on cefepime he does in and out catheterizations. In the hospital he was discovered M I don't mild elevation of AST and ALT and the terbinafine was stopped.predictably the pressure ulcer on s his buttock looks betterusing silver alginate. The area on the left lateral leg also is better using Hydrofera Blue. The area between the first and second toes on the left better. First and second toes on the right still substantial but better. Finally the DTI on the left heel has held together and looks like it's resolving 02/07/18-he is here in follow-up evaluation for multiple ulcerations.  He has new injury to the lateral aspect of  the last issue [name] pressure ulcer, he states this is from adhesive removal trauma. He states he has tried multiple adhesive products with no success. All other ulcers appear stable. The left heel DTI is resolving. We will continue with same treatment plan and follow-up next week. 02/14/18; follow-up for multiple areas. He has [name] new area last week on the lateral aspect of his pressure ulcer more over the posterior trochanter. The original pressure ulcer looks quite stable has healthy granulation. 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 Almost fully epithelialized on the original superior area using Hydrofera Blue DTI on the left heel has peeled off this week to reveal [name] small superficial wound under denuded skin and subcutaneous tissue Both areas between the first and second toes look better including nothing open on the left 02/21/18; The patient's wounds on his left ischial tuberosity and posterior left greater trochanter actually looked better. He has [name] large area of irritation around the area which I think is contact dermatitis. I am doubtful that this is fungal His original wound on the left lateral calf continues to improve we have been using Hydrofera Blue There is no open area in the left first second toe web space although there is [name] lot of thick callus The DTI on the left heel required debridement today of necrotic surface eschar and subcutaneous tissue using silver alginate Finally the area on the right first second toe webspace continues to contract using silver alginate and ketoconazole 02/28/18 Left ischial tuberosity wounds look better using silver alginate. Original wound on the left calf only has one small open area left using Hydrofera Blue DTI on the left heel required debridement mostly removing skin from around this wound surface. Using  silver alginate The areas on the right first/second toe web space using silver alginate and ketoconazole 03/08/18 on evaluation today patient appears to be doing decently well as best I can tell in regard to his wounds. This is the first time that I have seen him as he generally is followed by Dr. Dellia Nims. With that being said none of his wounds appear to be infected he does have an area where there is some skin covering what appears to be [name] new wound on the left dorsal surface of his great toe. This is right at the nail bed. With that being said I do believe that debrided away some of the excess skin can be of benefit in this regard. Otherwise he has been tolerating the dressing changes without complication. 03/14/18; patient arrives today with the multiplicity of wounds that we are following. He has not been systemically unwell Original wound on the left lateral calf now only has 2 small open areas we've been using Hydrofera Blue which should continue The deep tissue injury on the left heel requires debridement today. We've been using silver alginate The left first second toe and the right first second toe are both are reminiscence what I think was tinea pedis. Apparently some of the callus Surface between the toes was removed last week when it started draining. Purulent drainage coming from the wound on the ischial tuberosity on the left. 03/21/18-He is here in follow-up evaluation for multiple wounds. 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 He tolerated debridement. The only change to last week's recommendations is to discontinue antifungal cream between toes. He will follow-up next week  03/28/18; following up for multiple wounds;Concern this week is streaking redness and swelling in the right foot. He is going to need antibiotics for this. 03/31/18; follow-up for right foot cellulitis. Streaking redness and swelling in the  right foot on 03/28/18. 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 I put him on clindamycin 300 mg every 6 and brought him in for [name] quick check. He has an open wound between his first and second toes on the right foot as [name] potential source. 04/04/18; Right foot cellulitis is resolving he is completing clindamycin. This is truly good news Left lateral calf wound which is initial wound only has one small open area inferiorly this is close to healing out. He has compression stockings. We will use Hydrofera Blue right down to the epithelialization of this Nonviable surface on the left heel which was initially pressure with [name] DTI. We've been using Hydrofera Blue. I'm going to switch this back to silver alginate Left first second toe/tinea pedis this looks better using silver alginate Right first second toe tinea pedis using silver alginate Large pressure ulcers on theLeft ischial tuberosity. Small wound here Looks better. I am uncertain about the surface over the large wound. Using silver alginate 04/11/18; Cellulitis in the right foot is resolved Left lateral calf wound which was his original wounds still has 2 tiny open areas remaining this is just about closed Nonviable surface on the left heel is better but still requires debridement Left first second toe/tinea pedis still open using silver alginate Right first second toe wound tinea pedis I asked him to go back to using ketoconazole and silver alginate Large pressure ulcers on the left ischial tuberosity this shear injury here is resolved. Wound is smaller. No evidence of infection using silver alginate 04/18/18; Patient arrives with an intense area of cellulitis in the right mid lower calf extending into the right heel area. Bright red and warm. Smaller area on the left anterior leg. He has [name] significant history of MRSA. He will definitely need antibioticsdoxycycline He now has 2 open areas on the left ischial tuberosity the  original large wound and now [name] satellite area which I think was above his initial satellite areas. Not [name] wonderful surface on this satellite area surrounding erythema which looks like pressure related. His left lateral calf wound again his original wound is just about closed Left heel pressure injury still requiring debridement Left first second toe looks [name] lot better using silver alginate Right first second toe also using silver alginate and ketoconazole cream also looks better 04/20/18; the patient was worked in early today out of concerns with his cellulitis on the right leg. I had started him on doxycycline. This was 2 days ago. His wife was concerned about the swelling in the area. Also concerned about the left buttock. He has not been systemically unwell no fever chills. No nausea vomiting or diarrhea 04/25/18; the patient's left buttock wound is continued to deteriorate he is using Hydrofera Blue. He is still completing clindamycin for the cellulitis on the right leg although all of this looks better. 05/02/18 Left buttock wound still with [name] lot of drainage and [name] very tightly adherent fibrinous necrotic surface. He has [name] deeper area superiorly The left lateral calf wound is still closed DTI wound on the left heel necrotic surface especially the circumference using Iodoflex Areas between his left first second toe and right first second toe both look better. Dorsally and the right first second toe he  had [name] necrotic surface although at smaller. In using silver alginate and ketoconazole. I did [name] culture last week which was [name] deep tissue culture of the reminiscence of the open wound on the right first second toe dorsally. This grew [name] few Acinetobacter and [name] few methicillin-resistant staph aureus. Nevertheless the area actually this week looked better. I didn't feel the need to specifically address this at least in terms of systemic antibiotics. 05/09/18; wounds are measuring larger more drainage per  our intake. We are using Santyl covered with alginate on the large superficial buttock wounds, Iodosorb on the left heel, ketoconazole and silver alginate to the dorsal first and second toes bilaterally. 05/16/18; The area on his left buttock better in some aspects although the area superiorly over the ischial tuberosity required an extensive debridement.using Santyl Left heel appears stable. Using Iodoflex The areas between his first and second toes are not bad however there is spreading erythema up the dorsal aspect of his left foot this looks like cellulitis again. He is insensate the erythema is really very brilliant.o Erysipelas He went to see an allergist days ago because he was itching part of this he had lab work done. This showed [name] white count of 15.1 with 70% neutrophils. Hemoglobin of 11.4 and [name] platelet count of 659,000. Last white count we had in Epic was [name] 2-1/2 years ago which was 25.9 but he was ill at the time. He was able to show me some lab work that was done by his primary physician the pattern is about the same. 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 But prompted me to go ahead and do x-rays of both feet and the pelvis rule out osteomyelitis. He also had [name] comprehensive metabolic panel this was reasonably normal his albumin was 3.7 liver function tests BUN/creatinine all normal 05/23/18; x-rays of both his feet from last week were negative for underlying pulmonary abnormality. The x-ray of his pelvis however showed mild irregularity in the left ischial which may represent some early osteomyelitis. The wound in the left ischial continues to get deeper clearly now exposed muscle. Each week necrotic surface material over this area. Whereas the rest of the wounds do not look so bad. The left ischial wound we have been using Santyl and calcium alginate T the left heel surface necrotic debris using Iodoflex o The left lateral leg is still  healed Areas on the left dorsal foot and the right dorsal foot are about the same. There is some inflammation on the left which might represent contact dermatitis, fungal dermatitis I am doubtful cellulitis although this looks better than last week 05/30/18; CT scan done at Hospital did not show any osteomyelitis or abscess. Suggested the possibility of underlying cellulitis although I don't see [name] lot of evidence of this at the bedside The wound itself on the left buttock/upper thigh actually looks somewhat better. No debridement Left heel also looks better no debridement continue Iodoflex Both dorsal first second toe spaces appear better using Lotrisone. Left still required debridement 06/06/18; Intake reported some purulent looking drainage from the left gluteal wound. Using Santyl and calcium alginate Left heel looks better although still [name] nonviable surface requiring debridement The left dorsal foot first/second webspace actually expanding and somewhat deeper. I may consider doing [name] shave biopsy of this area Right dorsal foot first/second webspace appears stable to improved. Using Lotrisone and silver alginate to both these areas 06/13/18 Left gluteal surface looks  better. Now separated in the 2 wounds. No debridement required. Still drainage. We'll continue silver alginate Left heel continues to look better with Iodoflex continue this for at least another week Of his dorsal foot wounds the area on the left still has some depth although it looks better than last week. We've been using Lotrisone and silver alginate 06/20/18 Left gluteal continues to look better healthy tissue Left heel continues to look better healthy granulation wound is smaller. He is using Iodoflex and his long as this continues continue the Iodoflex Dorsal right foot looks better unfortunately dorsal left foot does not. There is swelling and erythema of his forefoot. He had minor trauma to this several days ago but doesn't  think this was enough to have caused any tissue injury. Foot looks like cellulitis, we have had this problem before 06/27/18 on evaluation today patient appears to be doing [name] little worse in regard to his foot ulcer. 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 Both of which would really be his only options for treating this infection. In the past he has been given and effusion of Orbactiv. This is done very well for him in the past again it's one time dosing IV antibiotic therapy. Subsequently I do believe this is something we're gonna need to see about doing at this point in time. Currently his other wounds seem to be doing somewhat better in my pinion I'm pretty happy in that regard. 07/03/18 on evaluation today patient's wounds actually appear to be doing fairly well. He has been tolerating the dressing changes without complication. All in all he seems to be showing signs of improvement. In regard to the antibiotics he has been dealing with infectious disease since I saw him last week as far as getting this scheduled. In the end he's going to be [facility] done this coming Friday. In the meantime he has been continuing to perform the dressing changes in such as previous. There does not appear to be any evidence of infection worsengin at this time. 07/10/18; Since I last saw this man 2 [name] ago things have actually improved. IV antibiotics of resulted in less forefoot erythema although there is still some present. He is not systemically unwell Left buttock wounds 2 now have no depth there is increased epithelialization Using silver alginate Left heel still requires debridement using Iodoflex Left dorsal foot still with [name] sizable wound about the size of [name] border but healthy granulation Right dorsal foot still with [name]  slitlike area using silver alginate 07/18/18; the patient's cellulitis in the left foot is improved in fact I think it is on its way to resolving. Left buttock wounds 2 both look better although the larger one has hypertension granulation we've been using silver alginate Left heel has some thick circumferential redundant skin over the wound edge which will need to be removed today we've been using Iodoflex Left dorsal foot is still [name] sizable wound required debridement using silver alginate The right dorsal foot is just about closed only [name] small open area remains here 07/25/18; left foot cellulitis is resolved Left buttock wounds 2 both look better. Hyper-granulation on the major area Left heel as some debris over the surface but otherwise looks [name] healthier wound. Using silver collagen Right dorsal foot is just about closed 07/31/18; arrives with  our intake nurse worried about purulent drainage from the buttock. We had hyper-granulation here last week His buttock wounds 2 continue to look better Left heel some debris over the surface but measuring smaller. Right dorsal foot unfortunately has openings between the toes Left foot superficial wound looks less aggravated. 08/07/18 Buttock wounds continue to look better although some of her granulation and the larger medial wound. silver alginate Left heel continues to look [name] lot better.silver collagen Left foot superficial wound looks less stable. Requires debridement. He has [name] new wound superficial area on the foot on the lateral dorsal foot. Right foot looks better using silver alginate without Lotrisone 08/14/2018; patient was in the ER last week diagnosed with [name] UTI. He is now on Cefpodoxime and Macrodantin. Buttock wounds continued to be smaller. Using silver alginate Left heel continues to look better using silver collagen Left foot superficial wound looks as though it is improving Right dorsal foot area is just about healed. 08/21/2018; patient is  completed his antibiotics for his UTI. He has 2 open areas on the buttocks. There is still not closed although the surface looks satisfactory. Using silver alginate Left heel continues to improve using silver collagen The bilateral dorsal foot areas which are at the base of his first and second toes/possible tinea pedis are actually stable on the left but worse on the right. The area on the left required debridement of necrotic surface. After debridement I obtained [name] specimen for PCR culture. The right dorsal foot which is been just about healed last week is now reopened 08/28/2018; culture done on the left dorsal foot showed coag negative staph both staph epidermidis and Lugdunensis. I think this is worthwhile initiating systemic treatment. I will use doxycycline given his long list of allergies. The area on the left heel slightly improved but still requiring debridement. The large wound on the buttock is just about closed whereas the smaller one is larger. Using silver alginate in this area 09/04/2018; patient is completing his doxycycline for the left foot although this continues to be [name] very difficult wound area with very adherent necrotic debris. We are using silver alginate to all his wounds right foot left foot and the small wounds on his buttock, silver collagen on the left heel. 09/11/2018; once again this patient has intense erythema and swelling of the left forefoot. Lesser degrees of erythema in the right foot. He has [name] long list of allergies and intolerances. I will reinstitute doxycycline. 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 Using silver alginate Left heel also looks better using silver collagen Unfortunately both the areas on his feet look worse. The area on the left first second webspace is now gone through to the plantar part of his foot. The area on the left foot anteriorly is irritated with erythema and swelling in the  forefoot. 09/25/2018 His wound on the left plantar heel looks better. Using silver collagen The area on the left buttock 2 small remnant areas. One is closed one is still open. Using silver alginate The areas between both his first and second toes look worse. This in spite of long-standing antifungal therapy with ketoconazole and silver alginate which should have antifungal activity He has small areas around his original wound on the left calf one is on the bottom of the original scar tissue and one superiorly both of these are small and superficial but again given wound history in this site this is  worrisome 10/02/2018 Left plantar heel continues to gradually contract using silver collagen Left buttock wound is unchanged using silver alginate The areas on his dorsal feet between his first and second toes bilaterally look about the same. I prescribed clindamycin ointment to see if we can address chronic staph colonization and also the underlying possibility of erythrasma The left lateral lower extremity wound is actually on the lateral part of his ankle. Small open area here. We have been using silver alginate 10/09/2018; Left plantar heel continues to look healthy and contract. No debridement is required Left buttock slightly smaller with [name] tape injury wound just below which was new this week Dorsal feet somewhat improved I have been using clindamycin Left lateral looks lower extremity the actual open area looks worse although [name] lot of this is epithelialized. I am going to change to silver collagen today He has [name] lot more swelling in the right leg although this is not pitting not red and not particularly warm there is [name] lot of spasm in the right leg usually indicative of people with paralysis of some underlying discomfort. We have reviewed his vascular status from 2017 he had [name] left greater saphenous vein ablation. I wonder about referring him back to vascular surgery if the area on the left  leg continues to deteriorate. 10/16/2018 in today for follow-up and management of multiple lower extremity ulcers. His left Buttock wound is much lower smaller and almost closed completely. The wound to the left ankle has began to reopen with Epithelialization and some adherent slough. He has multiple new areas to the left foot and leg. The left dorsal foot without much improvement. Wound present between left great webspace and 2nd toe. Erythema and edema present right leg. Right LE ultrasound obtained on 10/10/18 was negative for DVT . 10/23/2018; Left buttock is closed over. Still dry macerated skin but there is no open wound. I suspect this is chronic pressure/moisture Left lateral calf is quite [name] bit worse than when I saw this last. There is clearly drainage here he has macerated skin into the left plantar heel. We will change the primary dressing to alginate Left dorsal foot has some improvement in overall wound area. Still using clindamycin and silver alginate Right dorsal foot about the same as the left using clindamycin and silver alginate The erythema in the right leg has resolved. He is DVT rule out was negative Left heel pressure area required debridement although the wound is smaller and the surface is health 10/26/2018 The patient came back in for his nurse check today predominantly because of the drainage coming out of the left lateral leg with [name] recent reopening of his original wound on the left lateral calf. He comes in today with [name] large amount of surrounding erythema around the wound extending from the calf into the ankle and even in the area on the dorsal foot. He is not systemically unwell. He is not febrile. Nevertheless this looks like cellulitis. We have been using silver alginate to the area. I changed him to [name] regular visit and I am going to prescribe him doxycycline. The rationale here is [name] long list of medication intolerances and [name] history of MRSA. I did not see anything  that I thought would provide [name] valuable culture 10/30/2018 Follow-up from his appointment 4 days ago with really an extensive area of cellulitis in the left calf left lateral ankle and left dorsal foot. I put him on doxycycline. He has [name] long list of medication allergies  which are true allergy reactions. 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 In any case he is [name] lot better today. The erythema especially in the anterior and lateral left calf is better. He still has left ankle erythema. He also is complaining about increasing edema in the right leg we have only been using Kerlix Coban and he has been doing the wraps at home. Finally he has [name] spotty rash on the medial part of his upper left calf which looks like folliculitis or perhaps wrap occlusion type injury. Small superficial macules not pustules 11/06/18 patient arrives today with again [name] considerable degree of erythema around the wound on the left lateral calf extending into the dorsal ankle and dorsal foot. This is [name] lot worse than when I saw this last week. He is on doxycycline really with not [name] lot of improvement. He has not been systemically unwell Wounds on the; left heel actually looks improved. Original area on the left foot and proximity to the first and second toes looks about the same. He has superficial areas on the dorsal foot, anterior calf and then the reopening of his original wound on the left lateral calf which looks about the same The only area he has on the right is the dorsal webspace first and second which is smaller. He has [name] large area of dry erythematous skin on the left buttock small open area here. 11/13/2018; the patient arrives in much better condition. The erythema around the wound on the left lateral calf is [name] lot better. 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 In any case this is [name] lot better. The area on the left heel is very small and just about resolved using silver collagen we have been using silver alginate to the areas on his dorsal feet 11/20/2018; his wounds include the left lateral calf, left heel, dorsal aspects of both feet just proximal to the first second webspace. He is stable to slightly improved. I did not think any changes to his dressings were going to be necessary 11/27/2018 he has [name] reopening on the left buttock which is surrounded by what looks like tinea or perhaps some other form of dermatitis. The area on the left dorsal foot has some erythema around it I have marked this area but I am not sure whether this is cellulitis or not. Left heel is not closed. Left calf the reopening is really slightly longer and probably worse 1/13; in general things look better and smaller except for the left dorsal foot. Area on the left heel is just about closed, left buttock looks better only [name] small wound remains in the skin looks better [using Lotrisone] 1/20; the area on the left heel only has [name] few remaining open areas here. Left lateral calf about the same in terms of size, left dorsal foot slightly larger right lateral foot still not closed. The area on the left buttock has no open wound and the surrounding skin looks [name] lot better 1/27; the area on the left heel is closed. Left lateral calf better but still requiring extensive debridements. The area on his left buttock is closed. He still has the open areas on the left dorsal foot which is slightly smaller in the right foot which is slightly expanded. We have been using Iodoflex on these areas as well 2/3; left heel is  closed. Left lateral calf still requiring debridement using Iodoflex there is no open area on his left buttock however he has dry scaly skin over [name] large area of this. Not really responding well to the Lotrisone. Finally the areas on his dorsal feet at the level of the  first second webspace are slightly smaller on the right and about the same on the left. Both of these vigorously debrided with Anasept and gauze 2/10; left heel remains closed he has dry erythematous skin over the left buttock but there is no open wound here. Left lateral leg has come in and with. Still requiring debridement we have been using Iodoflex here. Finally the area on the left dorsal foot and right dorsal foot are really about the same extremely dry callused fissured areas. He does not yet have [name] dermatology appointment 2/17; left heel remains closed. He has [name] new open area on the left buttock. The area on the left lateral calf is bigger longer and still covered in necrotic debris. No major change in his foot areas bilaterally. I am awaiting for [name] dermatologist to look on this. We have been using ketoconazole I do not know that this is been doing any good at all. 2/24; left heel remains closed. The left buttock wound that was new reopening last week looks better. The left lateral calf appears better also although still requires debridement. The major area on his foot is the left first second also requiring debridement. We have been putting Prisma on all wounds. I do not believe that the ketoconazole has done too much good for his feet. He will use Lotrisone I am going to give him [name] 2-week course of terbinafine. We still do not have [name] dermatology appointment 3/2 left heel remains closed however there is skin over bone in this area I pointed this out to him today. The left buttock wound is epithelialized but still does not look completely stable. The area on the left leg required debridement were using silver collagen here. With regards to his feet we changed to Lotrisone last week and silver alginate. 3/9; left heel remains closed. Left buttock remains closed. The area on the right foot is essentially closed. The left foot remains unchanged. Slightly smaller on the left lateral calf. Using  silver collagen to both of these areas 3/16-Left heel remains closed. Area on right foot is closed. Left lateral calf above the lateral malleolus open wound requiring debridement with easy bleeding. Left dorsal wound proximal to first toe also debrided. Left ischial area open new. Patient has been using Prisma with wrapping every 3 days. Dermatology appointment is apparently tomorrow.Patient has completed his terbinafine 2-week course with some apparent improvement according to him, there is still flaking and dry skin in his foot on the left 3/23; area on the right foot is reopened. The area on the left anterior foot is about the same still [name] very necrotic adherent surface. He still has the area on the left leg and reopening is on the left buttock. He apparently saw dermatology although I do not have [name] note. According to the patient who is usually fairly well informed they did not have any good ideas. Put him on oral terbinafine which she is been on before. 3/30; using silver collagen to all wounds. Apparently his dermatologist put him on doxycycline and rifampin presumably some culture grew staph. I do not have this result. He remains on terbinafine although I have used terbinafine on him before 4/6; patient has had  [name] fairly substantial reopening on the right foot between the first and second toes. He is finished his terbinafine and I believe is on doxycycline and rifampin still as prescribed by dermatology. We have been using silver collagen to all his wounds although the patient reports that he thinks silver alginate does better on the wounds on his buttock. 4/13; the area on his left lateral calf about the same size but it did not require debridement. Left dorsal foot just proximal to the webspace between the first and second toes is about the same. Still nonviable surface. I note some superficial bronze discoloration of the dorsal part of his foot Right dorsal foot just proximal to the first  and second toes also looks about the same. I still think there may be the same discoloration I noted above on the left Left buttock wound looks about the same 4/20; left lateral calf appears to be gradually contracting using silver collagen. 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 The left dorsal foot wound is debrided of tightly adherent necrotic debris and really cleans up quite nicely. The right area is worse with expansion. I did not debride this it is now over the base of the second toe The area on his left buttock is smaller no debridement is required using silver collagen 5/4; left calf continues to make good progress. He arrives with erythema around the wounds on his dorsal foot which even extends to the plantar aspect. Very concerning for coexistent infection. He is finished the erythromycin I gave him for possible erythrasma this does not seem to have helped. The area on the left foot is about the same base of the dorsal toes Is area on the buttock looks improved on the left 5/11; left calf and left buttock continued to make good progress. Left foot is about the same to slightly improved. Major problem is on the right foot. He has not had an x-ray. Deep tissue culture I did last week showed both Enterobacter and E. coli. I did not change the doxycycline I put him on empirically although neither 1 of these were plated to doxycycline. He arrives today with the erythema looking worse on both the dorsal and plantar foot. Macerated skin on the bottom of the foot. he has not been systemically unwell 5/18-Patient returns at 1 week, left calf wound appears to be making some progress, left buttock wound appears slightly worse than last time, left foot wound looks slightly better, right foot redness is marginally better. X-ray of both feet show no air or evidence of osteomyelitis. Patient is finished his Omnicef and terbinafine. He  continues to have macerated skin on the bottom of the left foot as well as right 5/26; left calf wound is better, left buttock wound appears to have multiple small superficial open areas with surrounding macerated skin. X-rays that I did last time showed no evidence of osteomyelitis in either foot. He is finished cefdinir and doxycycline. I do not think that he was on terbinafine. He continues to have [name] large superficial open area on the right foot anterior dorsal and slightly between the first and second toes. I did send him to dermatology 2 months ago or so wondering about whether they would do [name] fungal scraping. I do not believe they did but did do [name] culture. We have been using silver alginate to the toe areas, he has been using antifungals at home topically either ketoconazole or Lotrisone. We are using silver  collagen on the left foot, silver alginate on the right, silver collagen on the left lateral leg and silver alginate on the left buttock 6/1; left buttock area is healed. We have the left dorsal foot, left lateral leg and right dorsal foot. We are using silver alginate to the areas on both feet and silver collagen to the area on his left lateral calf 6/8; the left buttock apparently reopened late last week. He is not really sure how this happened. He is tolerating the terbinafine. Using silver alginate to all wounds 6/15; left buttock wound is larger than last week but still superficial. Came in the clinic today with [name] report of purulence from the left lateral leg I did not identify any infection Both areas on his dorsal feet appear to be better. He is tolerating the terbinafine. Using silver alginate to all wounds 6/22; left buttock is about the same this week, left calf quite [name] bit better. His left foot is about the same however he comes in with erythema and warmth in the right forefoot once again. Culture that I gave him in the beginning of May showed Enterobacter and E. coli. I gave him  doxycycline and things seem to improve although neither 1 of these organisms was specifically plated. 6/29; left buttock is larger and dry this week. Left lateral calf looks to me to be improved. Left dorsal foot also somewhat improved right foot completely unchanged. The erythema on the right foot is still present. He is completing the Ceftin dinner that I gave him empirically [see discussion above.) 7/6 - All wounds look to be stable and perhaps improved, the left buttock wound is slightly smaller, per patient bleeds easily, completed ceftin, the right foot redness is less, he is on terbinafine 7/13; left buttock wound about the same perhaps slightly narrower. Area on the left lateral leg continues to narrow. Left dorsal foot slightly smaller right foot about the same. We are using silver alginate on the right foot and Hydrofera Blue to the areas on the left. Unna boot on the left 2 layer compression on the right 7/20; left buttock wound absolutely the same. Area on lateral leg continues to get better. Left dorsal foot require debridement as did the right no major change in the 7/27; left buttock wound the same size necrotic debris over the surface. The area on the lateral leg is closed once again. His left foot looks better right foot about the same although there is some involvement now of the posterior first second toe area. He is still on terbinafine which I have given him for [name] month, not certain [name] centimeter major change 06/25/19-All wounds appear to be slightly improved according to report, left buttock wound looks clean, both foot wounds have minimal to no debris the right dorsal foot has minimal slough. We are using Hydrofera Blue to the left and silver alginate to the right foot and ischial wound. 8/10-Wounds all appear to be around the same, the right forefoot distal part has some redness which was not there before, however the wound looks clean and small. Ischial wound looks about the  same with no changes 8/17; his wound on the left lateral calf which was his original chronic venous insufficiency wound remains closed. Since I last saw him the areas on the left dorsal foot right dorsal foot generally appear better but require debridement. The area on his left initial tuberosity appears somewhat larger to me perhaps hyper granulated and bleeds very easily. We have been  using Hydrofera Blue to the left dorsal foot and silver alginate to everything else 8/24; left lateral calf remains closed. The areas on his dorsal feet on the webspace of the first and second toes bilaterally both look better. The area on the left buttock which is the pressure ulcer stage II slightly smaller. I change the dressing to Hydrofera Blue to all areas 8/31; left lateral calf remains closed. The area on his dorsal feet bilaterally look better. Using Hydrofera Blue. Still requiring debridement on the left foot. No change in the left buttock pressure ulcers however 9/14; left lateral calf remains closed. Dorsal feet look quite [name] bit better than 2 [name] ago. Flaking dry skin also [name] lot better with the ammonium lactate I gave him 2 [name] ago. The area on the left buttock is improved. He states that his Roho cushion developed [name] leak and he is getting [name] new one, in the interim he is offloading this vigorously 9/21; left calf remains closed. Left heel which was [name] possible DTI looks better this week. He had macerated tissue around the left dorsal foot right foot looks satisfactory and improved left buttock wound. I changed his dressings to his feet to silver alginate bilaterally. Continuing Hydrofera Blue on the left buttock. 9/28 left calf remains closed. Left heel did not develop anything [possible DTI] dry flaking skin on the left dorsal foot. Right foot looks satisfactory. Improved left buttock wound. We are using silver alginate on his feet Hydrofera Blue on the buttock. I have asked him to go back to the  Lotrisone on his feet including the wounds and surrounding areas 10/5; left calf remains closed. The areas on the left and right feet about the same. [name] lot of this is epithelialized however debris over the remaining open areas. He is using Lotrisone and silver alginate. The area on the left buttock using Hydrofera Blue 10/26. Patient has been out for 3 [name] secondary to Covid concerns. He tested negative but I think his wife tested positive. He comes in today with the left foot substantially worse, right foot about the same. Even more concerning he states that the area on his left buttock closed over but then reopened and is considerably deeper in one aspect than it was before [stage III wound] 11/2; left foot really about the same as last week. Quarter sized wound on the dorsal foot just proximal to the first second toes. Surrounding erythema with areas of denuded epithelium. This is not really much different looking. Did not look like cellulitis this time however. Right foot area about the same.. We have been using silver alginate alginate on his toes Left buttock still substantial irritated skin around the wound which I think looks somewhat better. We have been using Hydrofera Blue here. 11/9; left foot larger than last week and [name] very necrotic surface. Right foot I think is about the same perhaps slightly smaller. Debris around the circumference also addressed. Unfortunately on the left buttock there is been [name] decline. Satellite lesions below the major wound distally and now [name] an additional one posteriorly we have been using Hydrofera Blue but I think this is [name] pressure issue 11/16; left foot ulcer dorsally again [name] very adherent necrotic surface. Right foot is about the same. Not much change in the pressure ulcer on his left buttock. 11/30; left foot ulcer dorsally basically the same as when I saw him 2 [name] ago. Very adherent fibrinous debris on the wound surface. Patient reports [name] lot  of  drainage as well. 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 We have been using Iodoflex, patient changed back to alginate because of the drainage. Area on his right dorsal foot really looks benign with [name] healthier surface certainly [name] lot better than on the left. Left buttock wounds all improved using Hydrofera Blue 12/7; left dorsal foot again no improvement. Tightly adherent debris. PCR culture I did last week only showed likely skin contaminant. I have gone ahead and done [name] punch biopsy of this which is about the last thing in terms of investigations I can think to do. He has known venous insufficiency and venous hypertension and this could be the issue here. The area on the right foot is about the same left buttock slightly worse according to our intake nurse secondary to [facility] Blue sticking to the wound 12/14; biopsy of the left foot that I did last time showed changes that could be related to wound healing/chronic stasis dermatitis phenomenon no neoplasm. We have been using silver alginate to both feet. I change the one on the left today to Sorbact and silver alginate to his other 2 wounds 12/28; the patient arrives with the following problems; Major issue is the dorsal left foot which continues to be [name] larger deeper wound area. Still with [name] completely nonviable surface Paradoxically the area mirror image on the right on the right dorsal foot appears to be getting better. He had some loss of dry denuded skin from the lower part of his original wound on the left lateral calf. Some of this area looked [name] little vulnerable and for this reason we put him in wrap that on this side this week The area on his left buttock is larger. He still has the erythematous circular area which I think is [name] combination of pressure, sweat. This does not look like cellulitis or fungal dermatitis 11/26/2019; -Dorsal left foot large open wound with depth. Still  debris over the surface. Using Sorbact The area on the dorsal right foot paradoxically has closed over He has [name] reopening on the left ankle laterally at the base of his original wound that extended up into the calf. This appears clean. The left buttock wound is smaller but with very adherent necrotic debris over the surface. We have been using silver alginate here as well The patient had arterial studies done in 2017. He had biphasic waveforms at the dorsalis pedis and posterior tibial bilaterally. ABI in the left was 1.17. Digit waveforms were dampened. He has slight spasticity in the great toes I do not think [name] TBI would be possible 1/11; the patient comes in today with [name] sizable reopening between the first and second toes on the right. This is not exactly in the same location where we have been treating wounds previously. According to our intake nurse this was actually fairly deep but 0.6 cm. The area on the left dorsal foot looks about the same the surface is somewhat cleaner using Sorbact, his MRI is in 2 days. We have not managed yet to get arterial studies. The new reopening on the left lateral calf looks somewhat better using alginate. The left buttock wound is about the same using alginate 1/18; the patient had his ARTERIAL studies which were quite normal. ABI in the right at 1.13 with triphasic/biphasic waveforms on the left ABI 1.06 again with triphasic/biphasic waveforms. It would not have been possible to have done [name] toe brachial index because of spasticity. We have  been using Sorbac to the left foot alginate to the rest of his wounds on the right foot left lateral calf and left buttock 1/25; arrives in clinic with erythema and swelling of the left forefoot worse over the first MTP area. This extends laterally dorsally and but also posteriorly. Still has an area on the left lateral part of the lower part of his calf wound it is eschared and clearly not closed. Area on the left buttock  still with surrounding irritation and erythema. Right foot surface wound dorsally. The area between the right and first and second toes appears better. 2/1; The left foot wound is about the same. Erythema slightly better I gave him [name] week of doxycycline empirically Right foot wound is more extensive extending between the toes to the plantar surface Left lateral calf really no open surface on the inferior part of his original wound however the entire area still looks vulnerable Absolutely no improvement in the left buttock wound required debridement. 2/8; the left foot is about the same. Erythema is slightly improved I gave him clindamycin last week. Right foot looks better he is using Lotrimin and silver alginate He has [name] breakdown in the left lateral calf. Denuded epithelium which I have removed Left buttock about the same were using Hydrofera Blue 2/15; left foot is about the same there is less surrounding erythema. Surface still has tightly adherent debris which I have debriding however not making any progress Right foot has [name] substantial wound on the medial right second toe between the first and second webspace. Still an open area on the left lateral calf distal area. Buttock wound is about the same 2/22; left foot is about the same less surrounding erythema. Surface has adherent debris. Polymen Ag Right foot area significant wound between the first and second toes. We have been using silver alginate here Left lateral leg polymen Ag at the base of his original venous insufficiency wound Left buttock some improvement here 3/1; Right foot is deteriorating in the first second toe webspace. Larger and more substantial. We have been using silver alginate. Left dorsal foot about the same markedly adherent surface debris using PolyMem Ag Left lateral calf surface debris using PolyMem AG Left buttock is improved again using PolyMem Ag. He is completing his terbinafine. The erythema in the foot  seems better. He has been on this for 2 [name] 3/8; no improvement in any wound area in fact he has [name] small open area on the dorsal midfoot which is new this week. He has not gotten his foot x-rays yet 3/15; his x-rays were both negative for osteomyelitis of both feet. No major change in any of his wounds on the extremities however his buttock wounds are better. We have been using polymen on the buttocks, left lower leg. Iodoflex on the left foot and silver alginate on the right 3/22; arrives in clinic today with the 2 major issues are the improvement in the left dorsal foot wound which for once actually looks healthy with [name] nice healthy wound surface without debridement. Using Iodoflex here. Unfortunately on the left lateral calf which is in the distal part of his original wound he came to the clinic here for there was purulent drainage noted some increased breakdown scattered around the original area and [name] small area proximally. We we are using polymen here will change to silver alginate today. His buttock wound on the left is better and I think the area on the right first second toe webspace is  also improved 3/29; left dorsal foot looks better. Using Iodoflex. Left ankle culture from deterioration last time grew E. coli, Enterobacter and Enterococcus. I will give him [name] course of cefdinir although that will not cover Enterococcus. The area on the right foot in the webspace of the first and second toe lateral first toe looks better. The area on his buttock is about healed Vascular appointment is on April 21. This is to look at his venous system vis--vis continued breakdown of the wounds on the left including the left lateral leg and left dorsal foot he. He has had previous ablations on this side 4/5; the area between the right first and second toes lateral aspect of the first toe looks better. Dorsal aspect of the left first toe on the left foot also improved. Unfortunately the left lateral lower leg  is larger and there is [name] second satellite wound superiorly. The usual superficial abrasions on the left buttock overall better but certainly not closed 4/12; the area between the right first and second toes is improved. Dorsal aspect of the left foot also slightly smaller with [name] vibrant healthy looking surface. No real change in the left lateral leg and the left buttock wound is healed He has an unaffordable co-pay for Apligraf. Appointment with vein and vascular with regards to the left leg venous part of the circulation is on 4/21 4/19; we continue to see improvement in all wound areas. Although this is minor. He has his vascular appointment on 4/21. The area on the left buttock has not reopened although right in the center of this area the skin looks somewhat threatened 4/26; the left buttock is unfortunately reopened. In general his left dorsal foot has [name] healthy surface and looks somewhat smaller although it was not measured as such. The area between his first and second toe webspace on the right as [name] small wound against the first toe. The patient saw vascular surgery. The real question I was asking was about the small saphenous vein on the left. He has previously ablated left greater saphenous vein. Nothing further was commented on on the left. Right greater saphenous vein without reflux at the saphenofemoral junction or proximal thigh there was no indication for ablation of the right greater saphenous vein duplex was negative for DVT bilaterally. They did not think there was anything from [name] vascular surgery point of view that could be offered. They ABIs within normal limits 5/3; only small open area on the left buttock. The area on the left lateral leg which was his original venous reflux is now 2 wounds both which look clean. We are using Iodoflex on the left dorsal foot which looks healthy and smaller. He is down to [name] very tiny area between the right first and second toes, using  silver alginate 5/10; all of his wounds appear better. We have much better edema control in 4 layer compression on the left. This may be the factor that is allowing the left foot and left lateral calf to heal. He has external compression garments at home 04/14/20-All of his wounds are progressing well, the left forefoot is practically closed, left ischium appears to be about the same, right toe webspace is also smaller. The left lateral leg is about the same, continue using Hydrofera Blue to this, silver alginate to the ischium, Iodoflex to the toe space on the right 6/7; most of his wounds outside of the left buttock are doing well. The area on the left lateral calf and left  dorsal foot are smaller. The area on the right foot in between the first and second toe webspace is barely visible although he still says there is some drainage here is the only reason I did not heal this out. Unfortunately the area on the left buttock almost looks like he has [name] skin tear from tape. He has open wound and then [name] large flap of skin that we are trying to get adherence over an area just next to the remaining wound 6/21; 2 week follow-up. I believe is been here for nurse visits. Miraculously the area between his first and second toes on the left dorsal foot is closed over. Still open on the right first second web space. The left lateral calf has 2 open areas. Distally this is more superficial. The proximal area had [name] little more depth and required debridement of adherent necrotic material. His buttock wound is actually larger we have been using silver alginate here 6/28; the patient's area on the left foot remains closed. Still open wet area between the first and second toes on the right and also extending into the plantar aspect. We have been using silver alginate in this location. He has 2 areas on the left lower leg part of his original long wounds which I think are better. We have been using Hydrofera Blue here.  Hydrofera Blue to the left buttock which is stable 7/12; left foot remains closed. Left ankle is closed. May be [name] small area between his right first and second toes the only truly open area is on the left buttock. We have been using Hydrofera Blue here 7/19; patient arrives with marked deterioration especially in the left foot and ankle. We did not put him in [name] compression wrap on the left last week in fact he wore his juxta lite stockings on either side although he does not have an underlying stocking. He has [name] reopening on the left dorsal foot, left lateral ankle and [name] new area on the right dorsal ankle. More worrisome is the degree of erythema on the left foot extending on the lateral foot into the lateral lower leg on the left 7/26; the patient had erythema and drainage from the lateral left ankle last week. Culture of this grew MRSA resistant to doxycycline and clindamycin which are the 2 antibiotics we usually use with this patient who has multiple antibiotic allergies including linezolid, trimethoprim sulfamethoxazole. I had give him an empiric doxycycline and he comes in the area certainly looks somewhat better although it is blotchy in his lower leg. He has not been systemically unwell. He has had areas on the left dorsal foot which is [name] reopening, chronic wounds on the left lateral ankle. Both of these I think are secondary to chronic venous insufficiency. The area between his first and second toes is closed as far as I can tell. He had [name] new wrap injury on the right dorsal ankle last week. Finally he has an area on the left buttock. We have been using silver alginate to everything except the left buttock we are using Hydrofera Blue 06/30/20-Patient returns at 1 week, has been given [name] sample dose pack of NUZYRA which is [name] tetracycline derivative [omadacycline], patient has completed those, we have been using silver alginate to almost all the wounds except the left ischium where we are using  Hydrofera Blue all of them look better 8/16; since I last saw the patient he has been doing well. The area on the left buttock, left lateral  ankle and left foot are all closed today. He has completed the [place] I gave him last time and tolerated this well. He still has open areas on the right dorsal ankle and in the right first second toe area which we are using silver alginate. 8/23; we put him in his bilateral external compression stockings last week as he did not have anything open on either leg except for concerning area between the right first and second toe. He comes in today with an area on the left dorsal foot slightly more proximal than the original wound, the left lateral foot but this is actually [name] continuation of the area he had on the left lateral ankle from last time. As well he is opened up on the left buttock again. 8/30; comes in today with things looking [name] lot better. The area on the left lower ankle has closed down as has the left foot but with eschar in both areas. The area on the dorsal right ankle is also epithelialized. Very little remaining of the left buttock wound. We have been using silver alginate on all wound areas 9/13; the area in the first second toe webspace on the right has fully epithelialized. He still has some vulnerable epithelium on the right and the ankle and the dorsal foot. He notes weeping. He is using his juxta lite stocking. On the left again the left dorsal foot is closed left lateral ankle is closed. We went to the juxta lite stocking here as well. Still vulnerable in the left buttock although only 2 small open areas remain here 9/27; 2-week follow-up. We did not look at his left leg but the patient says everything is closed. He is [name] bit disturbed by the amount of edema in his left foot he is using juxta lite stockings but asking about over the toes stockings which would be 30/40, will talk to him next time. According to him there is no open wound on  either the left foot or the left ankle/calf He has an open area on the dorsal right calf which I initially point [name] wrap injury. He has superficial remaining wound on the left ischial tuberosity been using silver alginate although he says this sticks to the wound 10/5; we gave him 2-week follow-up but he called yesterday expressing some concerns about his right foot right ankle and the left buttock. He came in early. There is still no open areas on the left leg and that still in his juxta lite stocking 10/11; he only has 1 small area on the left buttock that remains measuring millimeters 1 mm. Still has the same irritated skin in this area. We recommended zinc oxide when this eventually closes and pressure relief is meticulously is he can do this. He still has an area on the dorsal part of his right first through third toes which is [name] bit irritated and still open and on the dorsal ankle near the crease of the ankle. We have been using silver alginate and using his own stocking. He has nothing open on the left leg or foot 10/25; 2-week follow-up. Not nearly as good on the left buttock as I was hoping. For open areas with 5 looking threatened small. He has the erythematous irritated chronic skin in this area. 1 area on the right dorsal ankle. He reports this area bleeds easily Right dorsal foot just proximal to the base of his toes We have been using silver alginate. 11/8; 2-week follow-up. Left buttock is about the same  although I do not think the wounds are in the same location we have been using silver alginate. I have asked him to use zinc oxide on the skin around the wounds. He still has [name] small area on the right dorsal ankle he reports this bleeds easily Right dorsal foot just proximal to the base of the toes does not have anything open although the skin is very dry and scaly He has [name] new opening on the nailbed of the left great toe. Nothing on the left ankle 11/29; 3-week follow-up. Left  buttock has 2 open areas. And washing of these wounds today started bleeding easily. Suggesting very friable tissue. We have been using silver alginate. Right dorsal ankle which I thought was initially [name] wrap injury we have been using silver alginate. Nothing open between the toes that I can see. He states the area on the left dorsal toe nailbed healed after the last visit in 2 or 3 days 12/13; 3-week follow-up. His left buttock now has 3 open areas but the original 2 areas are smaller using polymen here. Surrounding skin looks better. The right dorsal ankle is closed. He has [name] small opening on the right dorsal foot at the level of the third toe. In general the skin looks better here. He is wearing his juxta lite stocking on the left leg says there is nothing open 11/24/2020; 3 [name] follow-up. His left buttock still has the 3 open areas. We have been using polymen but due to lack of response he changed to [facility] area. Surrounding skin is dry erythematous and irritated looking. There is no evidence of infection either bacterial or fungal however there is loss of surface epithelium He still has very dry skin in his foot causing irritation and erythema on the dorsal part of his toes. This is not responded to prolonged courses of antifungal simply looks dry and irritated 1/24; left buttock area still looks about the same he was unable to find the triad ointment that we had suggested. The area on the right lower leg just above the dorsal ankle has reopened and the areas on the right foot between the first second and second third toes and scaling on the bottom of the foot has been about the same for quite some time now. been using silver alginate to all wound areas 2/7; left buttock wound looked quite good although not much smaller in terms of surface area surrounding skin looks better. Only [name] few dry flaking areas on the right foot in between the first and second toes the skin generally looks better  here [ammonium lactate]. Finally the area on the right dorsal ankle is closed 2/21; There is no open area on the right foot even between the right first and second toe. Skin around this area dorsally and plantar aspects look better. He has [name] reopening of the area on the right ankle just above the crease of the ankle dorsally. I continue to think that this is probably friction from spasms may be even this time with his stocking under the compression stockings. Wounds on his left buttock look about the same there [name] couple of areas that have reopened. He has [name] total square area of loss of epithelialization. This does not look like infection it looks like [name] contact dermatitis but I just cannot determine to what 3/14; there is nothing on the right foot between the first and second toes this was carefully inspected under illumination. Some chronic irritation on the dorsal part  of his foot from toes 1-3 at the base. Nothing really open here substantially. Still has an area on the right foot/ankle that is actually larger and hyper granulated. His buttock area on the left is just about closed however he has chronic inflammation with loss of the surface epithelial layer 3/28; 2-week follow-up. In clinic today with [name] new wound on the left anterior mid tibia. Says this happened about 2 [name] ago. He is not really sure how wonders about the spasticity of his legs at night whether that could have caused this other than that he does not have [name] good idea. He has been using topical antibiotics and silver alginate. The area on his right dorsal ankle seems somewhat better. Finally everything on his left buttock is closed. 4/11; 2-week follow-up. All of his wounds are better except for the area over the ischium and left buttock which have opened up widely again. At least part of this is covered in necrotic fibrinous material another part had rolled nonviable skin. The area on the right ankle, left anterior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 we were with the Apligraf run through his insurance. We have been using Iodoflex. He has [name] thick eschar on the left first second toe interface, I suspect this may be fungal however there is no visible open 12/23/16; no open wound on his right leg. He has 2 small areas left of the linear wound that was remaining last week. We have been using Prisma, I thought I have disclosed this week, we can only look forward to next week 01/03/17; the patient had concerning areas of erythema last week, already on doxycycline for UTI through his primary doctor. The erythema is absolutely no better there is warmth and swelling both medially from the left lateral leg wound and also the dorsal left foot. 01/06/17- Patient is here for follow-up evaluation of his left lateral leg ulcer and bilateral feet ulcers. He is on oral antibiotic therapy, tolerating that. Nursing staff and the patient states that the  erythema is improved from Monday. 01/13/17; the predominant left lateral leg wound continues to be problematic. I had put Apligraf on him earlier this month once. However he subsequently developed what appeared to be an intense cellulitis around the left lateral leg wound. I gave him Dalvance I think on 2/12 perhaps 2/13 he continues on cefdinir. The erythema is still present but the warmth and swelling is improved. I am hopeful that the cellulitis part of this control. I wouldn't be  surprised if there is an element of venous inflammation as well. 01/17/17. The erythema is present but better in the left leg. His left lateral leg wound still does not have [name] viable surface buttons certain parts of this long thin wound it appears like there has been improvement in dimensions. 01/20/17; the erythema still present but much better in the left leg. I'm thinking this is his usual degree of chronic venous inflammation. The wound on the left leg looks somewhat better. Is less surface slough 01/27/17; erythema is back to the chronic venous inflammation. The wound on the left leg is somewhat better. I am back to the point where I like to try an Apligraf once again 02/10/17; slight improvement in wound dimensions. Apligraf #2. He is completing his doxycycline 02/14/17; patient arrives today having completed doxycycline last Thursday. This was supposed to be [name] nurse visit however once again he hasn't tense erythema from the medial part of his wound extending over the lower leg. Also erythema in his foot this is roughly in the same distribution as last time. He has baseline chronic venous inflammation however this is [name] lot worse than the baseline I have learned to accept the on him is baseline inflammation 02/24/17- patient is here for follow-up evaluation. He is tolerating compression therapy. His voicing no complaints or concerns he is here anticipating an Apligraf 03/03/17; he arrives today with an adherent necrotic surface. I don't think this is surface is going to be amenable for Apligraf's. The erythema around his wound and on the left dorsal foot has resolved he is off antibiotics 03/10/17; better-looking surface today. I don't think he can tolerate Apligraf's. He tells me he had [name] wound VAC after [name] skin graft years ago to this area and they had difficulty with [name] seal. The erythema continues to be stable around this some degree of chronic venous inflammation but he also has recurrent cellulitis.  We have been using Iodoflex 03/17/17; continued improvement in the surface and may be small changes in dimensions. Using Iodoflex which seems the only thing that will control his surface 03/24/17- He is here for follow up evaluation of his LLE lateral ulceration and ulcer to right dorsal foot/toe space. He is voicing no complaints or concerns, He is tolerating compression wrap. 03/31/17 arrives today with [name] much healthier looking wound on the left lower extremity. We have been using Iodoflex for [name] prolonged period of time which has for the first time prepared and adequate looking wound bed although we have not had much in the way of wound dimension improvement. He also has [name] small wound between the first and second toe on the right 04/07/17; arrives today with [name] healthy-looking wound bed and at least the top 50% of this wound appears to be now her. No debridement was required I have changed him to [facility] last week after prolonged Iodoflex. He did not do well with Apligraf's. We've had [name] re-opening between the first and second toe on the right 04/14/17; arrives today  with [name] healthier looking wound bed contractions and the top 50% of this wound and some on the lesser 50%. Wound bed appears healthy. The area between the first and second toe on the right still remains problematic 04/21/17; continued very gradual improvement. Using [facility] 04/28/17; continued very gradual improvement in the left lateral leg venous insufficiency wound. His periwound erythema is very mild. We have been using Hydrofera Blue. Wound is making progress especially in the superior 50% 05/05/17; he continues to have very gradual improvement in the left lateral venous insufficiency wound. Both in terms with an length rings are improving. I debrided this every 2 [name] with #5 curet and we have been using Hydrofera Blue and again making good progress With regards to the wounds between his right first and second toe which I  thought might of been tinea pedis he is not making as much progress very dry scaly skin over the area. Also the area at the base of the left first and second toe in [name] similar condition 05/12/17; continued gradual improvement in the refractory left lateral venous insufficiency wound on the left. Dimension smaller. Surface still requiring debridement using Hydrofera Blue 05/19/17; continued gradual improvement in the refractory left lateral venous ulceration. Careful inspection of the wound bed underlying rumination suggested some degree of epithelialization over the surface no debridement indicated. Continue Hydrofera Blue difficult areas between his toes first and third on the left than first and second on the right. I'm going to change to silver alginate from silver collagen. Continue ketoconazole as I suspect underlying tinea pedis 05/26/17; left lateral leg venous insufficiency wound. We've been using Hydrofera Blue. I believe that there is expanding epithelialization over the surface of the wound albeit not coming from the wound circumference. This is [name] bit of an odd situation in which the epithelialization seems to be coming from the surface of the wound rather than in the exact circumference. There is still small open areas mostly along the lateral margin of the wound. ooHe has unchanged areas between the left first and second and the right first second toes which I been treating for tenia pedis 06/02/17; left lateral leg venous insufficiency wound. We have been using Hydrofera Blue. Somewhat smaller from the wound circumference. The surface of the wound remains [name] bit on it almost epithelialized sedation in appearance. I use an open curette today debridement in the surface of all of this especially the edges ooSmall open wounds remaining on the dorsal right first and second toe interspace and the plantar left first second toe and her face on the left 06/09/17; wound on the left lateral leg continues  to be smaller but very gradual and very dry surface using Hydrofera Blue 06/16/17 requires weekly debridements now on the left lateral leg although this continues to contract. I changed to silver collagen last week because of dryness of the wound bed. Using Iodoflex to the areas on his first and second toes/web space bilaterally 06/24/17; patient with history of paraplegia also chronic venous insufficiency with lymphedema. Has [name] very difficult wound on the left lateral leg. This has been gradually reducing in terms of with but comes in with [name] very dry adherent surface. High switch to silver collagen [name] week or so ago with hydrogel to keep the area moist. This is been refractory to multiple dressing attempts. He also has areas in his first and second toes bilaterally in the anterior and posterior web space. I had been using Iodoflex here after [name] prolonged  course of silver alginate with ketoconazole was ineffective [question tinea pedis] 07/14/17; patient arrives today with [name] very difficult adherent material over his left lateral lower leg wound. He also has surrounding erythema and poorly controlled edema. He was switched his Santyl last visit which the nurses are applying once during his doctor visit and once on [name] nurse visit. He was also reduced to 2 layer compression I'm not exactly sure of the issue here. 07/21/17; better surface today after 1 week of Iodoflex. Significant cellulitis that we treated last week also better. [Doxycycline] 07/28/17 better surface today with now 2 [name] of Iodoflex. Significant cellulitis treated with doxycycline. He has now completed the doxycycline and he is back to his usual degree of chronic venous inflammation/stasis dermatitis. He reminds me he has had ablations surgery here 08/04/17; continued improvement with Iodoflex to the left lateral leg wound in terms of the surface of the wound although the dimensions are better. He is not currently on any antibiotics, he has the  usual degree of chronic venous inflammation/stasis dermatitis. Problematic areas on the plantar aspect of the first second toe web space on the left and the dorsal aspect of the first second toe web space on the right. At one point I felt these were probably related to chronic fungal infections in treated him aggressively for this although we have not made any improvement here. 08/11/17; left lateral leg. Surface continues to improve with the Iodoflex although we are not seeing much improvement in overall wound dimensions. Areas on his plantar left foot and right foot show no improvement. In fact the right foot looks somewhat worse 08/18/17; left lateral leg. We changed to [facility] Blue last week after [name] prolonged course of Iodoflex which helps get the surface better. It appears that the wound with is improved. Continue with difficult areas on the left dorsal first second and plantar first second on the right 09/01/17; patient arrives in clinic today having had [name] temperature of 103 yesterday. He was seen in the ER and [facility]. The patient was concerned he could have cellulitis again in the right leg however they diagnosed him with [name] UTI and he is now on Keflex. He has [name] history of cellulitis which is been recurrent and difficult but this is been in the left leg, in the past 5 use doxycycline. He does in and out catheterizations at home which are risk factors for UTI 09/08/17; patient will be completing his Keflex this weekend. The erythema on the left leg is considerably better. He has [name] new wound today on the medial part of the right leg small superficial almost looks like [name] skin tear. He has worsening of the area on the right dorsal first and second toe. His major area on the left lateral leg is better. Using Hydrofera Blue on all areas 09/15/17; gradual reduction in width on the long wound in the left lateral leg. No debridement required. He also has wounds on the plantar aspect of his left  first second toe web space and on the dorsal aspect of the right first second toe web space. 09/22/17; there continues to be very gradual improvements in the dimensions of the left lateral leg wound. He hasn't round erythematous spot with might be pressure on his wheelchair. There is no evidence obviously of infection no purulence no warmth ooHe has [name] dry scaled area on the plantar aspect of the left first second toe ooImproved area on the dorsal right first second toe. 09/29/17; left lateral leg  wound continues to improve in dimensions mostly with an is still [name] fairly long but increasingly narrow wound. ooHe has [name] dry scaled area on the plantar aspect of his left first second toe web space ooIncreasingly concerning area on the dorsal right first second toe. In fact I am concerned today about possible cellulitis around this wound. The areas extending up his second toe and although there is deformities here almost appears to abut on the nailbed. 10/06/17; left lateral leg wound continues to make very gradual progress. Tissue culture I did from the right first second toe dorsal foot last time grew MRSA and enterococcus which was vancomycin sensitive. This was not sensitive to clindamycin or doxycycline. He is allergic to Zyvox and sulfa we have therefore arrange for him to have dalvance infusion tomorrow. He is had this in the past and tolerated it well 10/20/17; left lateral leg wound continues to make decent progress. This is certainly reduced in terms of with there is advancing epithelialization.ooThe cellulitis in the right foot looks better although he still has [name] deep wound in the dorsal aspect of the first second toe web space. Plantar left first toe web space on the left I think is making some progress 10/27/17; left lateral leg wound continues to make decent progress. Advancing epithelialization.using Hydrofera Blue ooThe right first second toe web space wound is better-looking using  silver alginate ooImprovement in the left plantar first second toe web space. Again using silver alginate 11/03/17 left lateral leg wound continues to make decent progress albeit slowly. Using Hydrofera Blue ooThe right per second toe web space continues to be [name] very problematic looking punched out wound. I obtained [name] piece of tissue for deep culture I did extensively treated this for fungus. It is difficult to imagine that this is [name] pressure area as the patient states other than going outside he doesn't really wear shoes at home ooThe left plantar first second toe web space looked fairly senescent. Necrotic edges. This required debridement oochange to Hydrofera Blue to all wound areas 11/10/17; left lateral leg wound continues to contract. Using Hydrofera Blue ooOn the right dorsal first second toe web space dorsally. Culture I did of this area last week grew MRSA there is not an easy oral option in this patient was multiple antibiotic allergies or intolerances. This was only [name] rare culture isolate I'm therefore going to use Bactroban under silver alginate ooOn the left plantar first second toe web space. Debridement is required here. This is also unchanged 11/17/17; left lateral leg wound continues to contract using Hydrofera Blue this is no longer the major issue. ooThe major concern here is the right first second toe web space. He now has an open area going from dorsally to the plantar aspect. There is now wound on the inner lateral part of the first toe. Not [name] very viable surface on this. There is erythema spreading medially into the forefoot. ooNo major change in the left first second toe plantar wound 11/24/17; left lateral leg wound continues to contract using Hydrofera Blue. Nice improvement today ooThe right first second toe web space all of this looks [name] lot less angry than last week. I have given him clindamycin and topical Bactroban for MRSA and terbinafine for the possibility of  underlining tinea pedis that I could not control with ketoconazole. Looks somewhat better ooThe area on the plantar left first second toe web space is weeping with dried debris around the wound 12/01/17; left lateral leg wound continues to  contract he [employer]. It is becoming thinner in terms of with nevertheless it is making good improvement. ooThe right first second toe web space looks less angry but still [name] large necrotic-looking wounds starting on the plantar aspect of the right foot extending between the toes and now extensively on the base of the right second toe. I gave him clindamycin and topical Bactroban for MRSA anterior benefiting for the possibility of underlying tinea pedis. Not looking better today ooThe area on the left first/second toe looks better. Debrided of necrotic debris 12/05/17* the patient was worked in urgently today because over the weekend he found blood on his incontinence bad when he woke up. He was found to have an ulcer by his wife who does most of his wound care. He came in today for [place] to look at this. He has not had [name] history of wounds in his buttocks in spite of his paraplegia. 12/08/17; seen in follow-up today at his usual appointment. He was seen earlier this week and found to have [name] new wound on his buttock. We also follow him for wounds on the left lateral leg, left first second toe web space and right first second toe web space 12/15/17; we have been using Hydrofera Blue to the left lateral leg which has improved. The right first second toe web space has also improved. Left first second toe web space plantar aspect looks stable. The left buttock has worsened using Santyl. Apparently the buttock has drainage 12/22/17; we have been using Hydrofera Blue to the left lateral leg which continues to improve now 2 small wounds separated by normal skin. He tells [place] he had [name] fever up to 100 yesterday he is prone to UTIs but has not noted anything different. He does  in and out catheterizations. The area between the first and second toes today does not look good necrotic surface covered with what looks to be purulent drainage and erythema extending into the third toe. I had gotten this to something that I thought look better last time however it is not look good today. He also has [name] necrotic surface over the buttock wound which is expanded. I thought there might be infection under here so I removed [name] lot of the surface with [name] #5 curet though nothing look like it really needed culturing. He is been using Santyl to this area 12/27/17; his original wound on the left lateral leg continues to improve using Hydrofera Blue. I gave him samples of Baxdella although he was unable to take them out of fear for an allergic reaction ["lump in his throat"].the culture I did of the purulent drainage from his second toe last week showed both enterococcus and [name] set Enterobacter I was also concerned about the erythema on the bottom of his foot although paradoxically although this looks somewhat better today. 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 We've been using silver alginate here. They came up today that he sleeps in [name] recliner, I'm not sure why but I asked him to stop this 01/03/18; his original wound we've been using Hydrofera Blue is now separated into 2 areas. ooUlcer on his left buttock is better he is off the recliner and sleeping in bed ooFinally both wound areas between his first and second toes also looks some better 01/10/18; his original wound on the left lateral leg is now separated into 2 wounds we've been using Hydrofera  Blue ooUlcer on his left buttock has some drainage. There is [name] small probing site going into muscle layer superiorly.using silver alginate -He arrives today with [name] deep tissue injury on the left heel ooThe wound on the dorsal aspect of his first  second toe on the left looks [name] lot betterusing silver alginate ketoconazole ooThe area on the first second toe web space on the right also looks [name] lot bette 01/17/18; his original wound on the left lateral leg continues to progress using Hydrofera Blue ooUlcer on his left buttock also is smaller surface healthier except for [name] small probing site going into the muscle layer superiorly. 2.4 cm of tunneling in this area ooDTI on his left heel we have only been offloading. Looks better than last week no threatened open no evidence of infection oothe wound on the dorsal aspect of the first second toe on the left continues to look like it's regressing we have only been using silver alginate and terbinafine orally ooThe area in the first second toe web space on the right also looks to be [name] lot better using silver alginate and terbinafine I think this was prompted by tinea pedis 01/31/18; the patient was hospitalized in [place] last week apparently for [name] complicated UTI. He was discharged on cefepime he does in and out catheterizations. In the hospital he was discovered M I don't mild elevation of AST and ALT and the terbinafine was stopped.predictably the pressure ulcer on s his buttock looks betterusing silver alginate. The area on the left lateral leg also is better using Hydrofera Blue. The area between the first and second toes on the left better. First and second toes on the right still substantial but better. Finally the DTI on the left heel has held together and looks like it's resolving 02/07/18-he is here in follow-up evaluation for multiple ulcerations. He has new injury to the lateral aspect of the last issue [name] pressure ulcer, he states this is from adhesive removal trauma. He states he has tried multiple adhesive products with no success. All other ulcers appear stable. The left heel DTI is resolving. We will continue with same treatment plan and follow-up next week. 02/14/18; follow-up for  multiple areas. ooHe has [name] new area last week on the lateral aspect of his pressure ulcer more over the posterior trochanter. The original pressure ulcer looks quite stable has healthy granulation. 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 Almost fully epithelialized on the original superior area using Hydrofera Blue ooDTI on the left heel has peeled off this week to reveal [name] small superficial wound under denuded skin and subcutaneous tissue ooBoth areas between the first and second toes look better including nothing open on the left 02/21/18; ooThe patient's wounds on his left ischial tuberosity and posterior left greater trochanter actually looked better. He has [name] large area of irritation around the area which I think is contact dermatitis. I am doubtful that this is fungal ooHis original wound on the left lateral calf continues to improve we have been using Hydrofera Blue ooThere is no open area in the left first second toe web space although there is [name] lot of thick callus ooThe DTI on the left heel required debridement today of necrotic surface eschar and subcutaneous tissue using silver alginate ooFinally the area on the right first second toe webspace continues to contract using silver alginate and ketoconazole 02/28/18 ooLeft ischial tuberosity  wounds look better using silver alginate. ooOriginal wound on the left calf only has one small open area left using Hydrofera Blue ooDTI on the left heel required debridement mostly removing skin from around this wound surface. Using silver alginate ooThe areas on the right first/second toe web space using silver alginate and ketoconazole 03/08/18 on evaluation today patient appears to be doing decently well as best I can tell in regard to his wounds. This is the first time that I have seen him as he generally is followed by Dr. Dellia Nims.  With that being said none of his wounds appear to be infected he does have an area where there is some skin covering what appears to be [name] new wound on the left dorsal surface of his great toe. This is right at the nail bed. With that being said I do believe that debrided away some of the excess skin can be of benefit in this regard. Otherwise he has been tolerating the dressing changes without complication. 03/14/18; patient arrives today with the multiplicity of wounds that we are following. He has not been systemically unwell ooOriginal wound on the left lateral calf now only has 2 small open areas we've been using Hydrofera Blue which should continue ooThe deep tissue injury on the left heel requires debridement today. We've been using silver alginate ooThe left first second toe and the right first second toe are both are reminiscence what I think was tinea pedis. Apparently some of the callus Surface between the toes was removed last week when it started draining. ooPurulent drainage coming from the wound on the ischial tuberosity on the left. 03/21/18-He is here in follow-up evaluation for multiple wounds. 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 He tolerated debridement. The only change to last week's recommendations is to discontinue antifungal cream between toes. He will follow-up next week 03/28/18; following up for multiple wounds;Concern this week is streaking redness and swelling in the right foot. He is going to need antibiotics for this. 03/31/18; follow-up for right foot cellulitis. Streaking redness and swelling in the right foot on 03/28/18. 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 I put him on clindamycin 300 mg every 6 and brought him in for [name] quick check. He has an open wound between his first and second toes on the right foot as [name] potential source. 04/04/18; ooRight foot cellulitis  is resolving he is completing clindamycin. This is truly good news ooLeft lateral calf wound which is initial wound only has one small open area inferiorly this is close to healing out. He has compression stockings. We will use Hydrofera Blue right down to the epithelialization of this ooNonviable surface on the left heel which was initially pressure with [name] DTI. We've been using Hydrofera Blue. I'm going to switch this back to silver alginate ooLeft first second toe/tinea pedis this looks better using silver alginate ooRight first second toe tinea pedis using silver alginate ooLarge pressure ulcers on theLeft ischial tuberosity. Small wound here Looks better. I am uncertain about the surface over the large wound. Using silver alginate 04/11/18; ooCellulitis in the right foot is resolved ooLeft lateral calf wound which was his original wounds still has 2 tiny open areas remaining this is just about closed ooNonviable surface on the left heel is better but still requires debridement ooLeft first second toe/tinea pedis still open using silver alginate ooRight first second toe wound tinea pedis I asked him  to go back to using ketoconazole and silver alginate ooLarge pressure ulcers on the left ischial tuberosity this shear injury here is resolved. Wound is smaller. No evidence of infection using silver alginate 04/18/18; ooPatient arrives with an intense area of cellulitis in the right mid lower calf extending into the right heel area. Bright red and warm. Smaller area on the left anterior leg. He has [name] significant history of MRSA. He will definitely need antibioticsoodoxycycline ooHe now has 2 open areas on the left ischial tuberosity the original large wound and now [name] satellite area which I think was above his initial satellite areas. Not [name] wonderful surface on this satellite area surrounding erythema which looks like pressure related. ooHis left lateral calf wound again his original  wound is just about closed ooLeft heel pressure injury still requiring debridement ooLeft first second toe looks [name] lot better using silver alginate ooRight first second toe also using silver alginate and ketoconazole cream also looks better 04/20/18; the patient was worked in early today out of concerns with his cellulitis on the right leg. I had started him on doxycycline. This was 2 days ago. His wife was concerned about the swelling in the area. Also concerned about the left buttock. He has not been systemically unwell no fever chills. No nausea vomiting or diarrhea 04/25/18; the patient's left buttock wound is continued to deteriorate he is using Hydrofera Blue. He is still completing clindamycin for the cellulitis on the right leg although all of this looks better. 05/02/18 ooLeft buttock wound still with [name] lot of drainage and [name] very tightly adherent fibrinous necrotic surface. He has [name] deeper area superiorly ooThe left lateral calf wound is still closed ooDTI wound on the left heel necrotic surface especially the circumference using Iodoflex ooAreas between his left first second toe and right first second toe both look better. Dorsally and the right first second toe he had [name] necrotic surface although at smaller. In using silver alginate and ketoconazole. I did [name] culture last week which was [name] deep tissue culture of the reminiscence of the open wound on the right first second toe dorsally. This grew [name] few Acinetobacter and [name] few methicillin-resistant staph aureus. Nevertheless the area actually this week looked better. I didn't feel the need to specifically address this at least in terms of systemic antibiotics. 05/09/18; wounds are measuring larger more drainage per our intake. We are using Santyl covered with alginate on the large superficial buttock wounds, Iodosorb on the left heel, ketoconazole and silver alginate to the dorsal first and second toes bilaterally. 05/16/18; ooThe area  on his left buttock better in some aspects although the area superiorly over the ischial tuberosity required an extensive debridement.using Santyl ooLeft heel appears stable. Using Iodoflex ooThe areas between his first and second toes are not bad however there is spreading erythema up the dorsal aspect of his left foot this looks like cellulitis again. He is insensate the erythema is really very brilliant.o Erysipelas He went to see an allergist days ago because he was itching part of this he had lab work done. This showed [name] white count of 15.1 with 70% neutrophils. Hemoglobin of 11.4 and [name] platelet count of 659,000. Last white count we had in [facility] was [name] 2-1/2 years ago which was 25.9 but he was ill at the time. He was able to show me some lab work that was done by his primary physician the pattern is about the same. 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 But prompted me to go ahead and do x-rays of both feet and the pelvis rule out osteomyelitis. He also had [name] comprehensive metabolic panel this was reasonably normal his albumin was 3.7 liver function tests BUN/creatinine all normal 05/23/18; x-rays of both his feet from last week were negative for underlying pulmonary abnormality. The x-ray of his pelvis however showed mild irregularity in the left ischial which may represent some early osteomyelitis. The wound in the left ischial continues to get deeper clearly now exposed muscle. Each week necrotic surface material over this area. Whereas the rest of the wounds do not look so bad. ooThe left ischial wound we have been using Santyl and calcium alginate ooT the left heel surface necrotic debris using Iodoflex o ooThe left lateral leg is still healed ooAreas on the left dorsal foot and the right dorsal foot are about the same. There is some inflammation on the left which might represent contact dermatitis, fungal dermatitis I am doubtful  cellulitis although this looks better than last week 05/30/18; CT scan done at Hospital did not show any osteomyelitis or abscess. Suggested the possibility of underlying cellulitis although I don't see [name] lot of evidence of this at the bedside ooThe wound itself on the left buttock/upper thigh actually looks somewhat better. No debridement ooLeft heel also looks better no debridement continue Iodoflex ooBoth dorsal first second toe spaces appear better using Lotrisone. Left still required debridement 06/06/18; ooIntake reported some purulent looking drainage from the left gluteal wound. Using Santyl and calcium alginate ooLeft heel looks better although still [name] nonviable surface requiring debridement ooThe left dorsal foot first/second webspace actually expanding and somewhat deeper. I may consider doing [name] shave biopsy of this area ooRight dorsal foot first/second webspace appears stable to improved. Using Lotrisone and silver alginate to both these areas 06/13/18 ooLeft gluteal surface looks better. Now separated in the 2 wounds. No debridement required. Still drainage. We'll continue silver alginate ooLeft heel continues to look better with Iodoflex continue this for at least another week ooOf his dorsal foot wounds the area on the left still has some depth although it looks better than last week. We've been using Lotrisone and silver alginate 06/20/18 ooLeft gluteal continues to look better healthy tissue ooLeft heel continues to look better healthy granulation wound is smaller. He is using Iodoflex and his long as this continues continue the Iodoflex ooDorsal right foot looks better unfortunately dorsal left foot does not. There is swelling and erythema of his forefoot. He had minor trauma to this several days ago but doesn't think this was enough to have caused any tissue injury. Foot looks like cellulitis, we have had this problem before 06/27/18 on evaluation today patient appears to  be doing [name] little worse in regard to his foot ulcer. 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 Both of which would really be his only options for treating this infection. In the past he has been given and effusion of Orbactiv. This is done very well for him in the past again it's one time dosing IV antibiotic therapy. Subsequently I do believe this is something we're gonna need to see about doing at this point in time. Currently his other wounds seem to be doing somewhat better in my pinion I'm pretty happy in that regard. 07/03/18  on evaluation today patient's wounds actually appear to be doing fairly well. He has been tolerating the dressing changes without complication. All in all he seems to be showing signs of improvement. In regard to the antibiotics he has been dealing with infectious disease since I saw him last week as far as getting this scheduled. In the end [facility]'s [facility] this coming Friday. In the meantime he has been continuing to perform the dressing changes in such as previous. There does not appear to be any evidence of infection worsengin at this time. 07/10/18; ooSince I last saw this man 2 [name] ago things have actually improved. IV antibiotics of resulted in less forefoot erythema although there is still some present. He is not systemically unwell ooLeft buttock wounds o2 now have no depth there is increased epithelialization Using silver alginate ooLeft heel still requires debridement using Iodoflex ooLeft dorsal foot still with [name] sizable wound about the size of [name] border but healthy granulation ooRight dorsal foot still with [name] slitlike area using silver alginate 07/18/18; the patient's cellulitis in the left foot is improved in fact I think it is on its way to  resolving. ooLeft buttock wounds o2 both look better although the larger one has hypertension granulation we've been using silver alginate ooLeft heel has some thick circumferential redundant skin over the wound edge which will need to be removed today we've been using Iodoflex ooLeft dorsal foot is still [name] sizable wound required debridement using silver alginate ooThe right dorsal foot is just about closed only [name] small open area remains here 07/25/18; left foot cellulitis is resolved ooLeft buttock wounds o2 both look better. Hyper-granulation on the major area ooLeft heel as some debris over the surface but otherwise looks [name] healthier wound. Using silver collagen ooRight dorsal foot is just about closed 07/31/18; arrives with our intake nurse worried about purulent drainage from the buttock. We had hyper-granulation here last week ooHis buttock wounds o2 continue to look better ooLeft heel some debris over the surface but measuring smaller. ooRight dorsal foot unfortunately has openings between the toes ooLeft foot superficial wound looks less aggravated. 08/07/18 ooButtock wounds continue to look better although some of her granulation and the larger medial wound. silver alginate ooLeft heel continues to look [name] lot better.silver collagen ooLeft foot superficial wound looks less stable. Requires debridement. He has [name] new wound superficial area on the foot on the lateral dorsal foot. ooRight foot looks better using silver alginate without Lotrisone 08/14/2018; patient was in the ER last week diagnosed with [name] UTI. He is now on Cefpodoxime and Macrodantin. ooButtock wounds continued to be smaller. Using silver alginate ooLeft heel continues to look better using silver collagen ooLeft foot superficial wound looks as though it is improving ooRight dorsal foot area is just about healed. 08/21/2018; patient is completed his antibiotics for his UTI. ooHe has 2 open areas on the  buttocks. There is still not closed although the surface looks satisfactory. Using silver alginate ooLeft heel continues to improve using silver collagen ooThe bilateral dorsal foot areas which are at the base of his first and second toes/possible tinea pedis are actually stable on the left but worse on the right. The area on the left required debridement of necrotic surface. After debridement I obtained [name] specimen for PCR culture. ooThe right dorsal foot which is been just about healed last week is now reopened 08/28/2018; culture done on the  left dorsal foot showed coag negative staph both staph epidermidis and Lugdunensis. I think this is worthwhile initiating systemic treatment. I will use doxycycline given his long list of allergies. The area on the left heel slightly improved but still requiring debridement. ooThe large wound on the buttock is just about closed whereas the smaller one is larger. Using silver alginate in this area 09/04/2018; patient is completing his doxycycline for the left foot although this continues to be [name] very difficult wound area with very adherent necrotic debris. We are using silver alginate to all his wounds right foot left foot and the small wounds on his buttock, silver collagen on the left heel. 09/11/2018; once again this patient has intense erythema and swelling of the left forefoot. Lesser degrees of erythema in the right foot. He has [name] long list of allergies and intolerances. I will reinstitute doxycycline. 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 Using silver alginate ooLeft heel also looks better using silver collagen ooUnfortunately both the areas on his feet look worse. The area on the left first second webspace is now gone through to the plantar part of his foot. The area on the left foot anteriorly is irritated with erythema and swelling in the forefoot. 09/25/2018 ooHis wound on the left plantar  heel looks better. Using silver collagen ooThe area on the left buttock 2 small remnant areas. One is closed one is still open. Using silver alginate ooThe areas between both his first and second toes look worse. This in spite of long-standing antifungal therapy with ketoconazole and silver alginate which should have antifungal activity ooHe has small areas around his original wound on the left calf one is on the bottom of the original scar tissue and one superiorly both of these are small and superficial but again given wound history in this site this is worrisome 10/02/2018 ooLeft plantar heel continues to gradually contract using silver collagen ooLeft buttock wound is unchanged using silver alginate ooThe areas on his dorsal feet between his first and second toes bilaterally look about the same. 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 Small open area here. We have been using silver alginate 10/09/2018; ooLeft plantar heel continues to look healthy and contract. No debridement is required ooLeft buttock slightly smaller with [name] tape injury wound just below which was new this week ooDorsal feet somewhat improved I have been using clindamycin ooLeft lateral looks lower extremity the actual open area looks worse although [name] lot of this is epithelialized. I am going to change to silver collagen today He has [name] lot more swelling in the right leg although this is not pitting not red and not particularly warm there is [name] lot of spasm in the right leg usually indicative of people with paralysis of some underlying discomfort. We have reviewed his vascular status from 2017 he had [name] left greater saphenous vein ablation. I wonder about referring him back to vascular surgery if the area on the left leg continues to  deteriorate. 10/16/2018 in today for follow-up and management of multiple lower extremity ulcers. His left Buttock wound is much lower smaller and almost closed completely. The wound to the left ankle has began to reopen with Epithelialization and some adherent slough. He has multiple new areas to the left  foot and leg. The left dorsal foot without much improvement. Wound present between left great webspace and 2nd toe. Erythema and edema present right leg. Right LE ultrasound obtained on 10/10/18 was negative for DVT . 10/23/2018; ooLeft buttock is closed over. Still dry macerated skin but there is no open wound. I suspect this is chronic pressure/moisture ooLeft lateral calf is quite [name] bit worse than when I saw this last. There is clearly drainage here he has macerated skin into the left plantar heel. We will change the primary dressing to alginate ooLeft dorsal foot has some improvement in overall wound area. Still using clindamycin and silver alginate ooRight dorsal foot about the same as the left using clindamycin and silver alginate ooThe erythema in the right leg has resolved. He is DVT rule out was negative ooLeft heel pressure area required debridement although the wound is smaller and the surface is health 10/26/2018 ooThe patient came back in for his nurse check today predominantly because of the drainage coming out of the left lateral leg with [name] recent reopening of his original wound on the left lateral calf. He comes in today with [name] large amount of surrounding erythema around the wound extending from the calf into the ankle and even in the area on the dorsal foot. He is not systemically unwell. He is not febrile. Nevertheless this looks like cellulitis. We have been using silver alginate to the area. I changed him to [name] regular visit and I am going to prescribe him doxycycline. The rationale here is [name] long list of medication intolerances and [name] history of MRSA. I did not see  anything that I thought would provide [name] valuable culture 10/30/2018 ooFollow-up from his appointment 4 days ago with really an extensive area of cellulitis in the left calf left lateral ankle and left dorsal foot. I put him on doxycycline. He has [name] long list of medication allergies which are true allergy reactions. 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 In any case he is [name] lot better today. The erythema especially in the anterior and lateral left calf is better. He still has left ankle erythema. He also is complaining about increasing edema in the right leg we have only been using Kerlix Coban and he has been doing the wraps at home. Finally he has [name] spotty rash on the medial part of his upper left calf which looks like folliculitis or perhaps wrap occlusion type injury. Small superficial macules not pustules 11/06/18 patient arrives today with again [name] considerable degree of erythema around the wound on the left lateral calf extending into the dorsal ankle and dorsal foot. This is [name] lot worse than when I saw this last week. He is on doxycycline really with not [name] lot of improvement. He has not been systemically unwell Wounds on the; left heel actually looks improved. Original area on the left foot and proximity to the first and second toes looks about the same. He has superficial areas on the dorsal foot, anterior calf and then the reopening of his original wound on the left lateral calf which looks about the same ooThe only area he has on the right is the dorsal webspace first and second which is smaller. ooHe has [name] large area of dry erythematous skin on the left buttock small open area here. 11/13/2018; the patient arrives in much better condition. The erythema around the wound on the left lateral calf is [name] lot  better. Not sure whether this was the clindamycin or the TCA and ketoconazole or just in the improvement in edema  control [stasis dermatitis]. In any case this is [name] lot better. The area on the left heel is very small and just about resolved using silver collagen we have been using silver alginate to the areas on his dorsal feet 11/20/2018; his wounds include the left lateral calf, left heel, dorsal aspects of both feet just proximal to the first second webspace. He is stable to slightly improved. I did not think any changes to his dressings were going to be necessary 11/27/2018 he has [name] reopening on the left buttock which is surrounded by what looks like tinea or perhaps some other form of dermatitis. The area on the left dorsal foot has some erythema around it I have marked this area but I am not sure whether this is cellulitis or not. Left heel is not closed. Left calf the reopening is really slightly longer and probably worse 1/13; in general things look better and smaller except for the left dorsal foot. Area on the left heel is just about closed, left buttock looks better only [name] small wound remains in the skin looks better [using Lotrisone] 1/20; the area on the left heel only has [name] few remaining open areas here. Left lateral calf about the same in terms of size, left dorsal foot slightly larger right lateral foot still not closed. The area on the left buttock has no open wound and the surrounding skin looks [name] lot better 1/27; the area on the left heel is closed. Left lateral calf better but still requiring extensive debridements. The area on his left buttock is closed. He still has the open areas on the left dorsal foot which is slightly smaller in the right foot which is slightly expanded. We have been using Iodoflex on these areas as well 2/3; left heel is closed. Left lateral calf still requiring debridement using Iodoflex there is no open area on his left buttock however he has dry scaly skin over [name] large area of this. Not really responding well to the Lotrisone. Finally the areas on his dorsal feet at the  level of the first second webspace are slightly smaller on the right and about the same on the left. Both of these vigorously debrided with Anasept and gauze 2/10; left heel remains closed he has dry erythematous skin over the left buttock but there is no open wound here. Left lateral leg has come in and with. Still requiring debridement we have been using Iodoflex here. Finally the area on the left dorsal foot and right dorsal foot are really about the same extremely dry callused fissured areas. He does not yet have [name] dermatology appointment 2/17; left heel remains closed. He has [name] new open area on the left buttock. The area on the left lateral calf is bigger longer and still covered in necrotic debris. No major change in his foot areas bilaterally. I am awaiting for [name] dermatologist to look on this. We have been using ketoconazole I do not know that this is been doing any good at all. 2/24; left heel remains closed. The left buttock wound that was new reopening last week looks better. The left lateral calf appears better also although still requires debridement. The major area on his foot is the left first second also requiring debridement. We have been putting Prisma on all wounds. I do not believe that the ketoconazole has done too much good for  his feet. He will use Lotrisone I am going to give him [name] 2-week course of terbinafine. We still do not have [name] dermatology appointment 3/2 left heel remains closed however there is skin over bone in this area I pointed this out to him today. The left buttock wound is epithelialized but still does not look completely stable. The area on the left leg required debridement were using silver collagen here. With regards to his feet we changed to Lotrisone last week and silver alginate. 3/9; left heel remains closed. Left buttock remains closed. The area on the right foot is essentially closed. The left foot remains unchanged. Slightly smaller on the left lateral  calf. Using silver collagen to both of these areas 3/16-Left heel remains closed. Area on right foot is closed. Left lateral calf above the lateral malleolus open wound requiring debridement with easy bleeding. Left dorsal wound proximal to first toe also debrided. Left ischial area open new. Patient has been using Prisma with wrapping every 3 days. Dermatology appointment is apparently tomorrow.Patient has completed his terbinafine 2-week course with some apparent improvement according to him, there is still flaking and dry skin in his foot on the left 3/23; area on the right foot is reopened. The area on the left anterior foot is about the same still [name] very necrotic adherent surface. He still has the area on the left leg and reopening is on the left buttock. He apparently saw dermatology although I do not have [name] note. According to the patient who is usually fairly well informed they did not have any good ideas. Put him on oral terbinafine which she is been on before. 3/30; using silver collagen to all wounds. Apparently his dermatologist put him on doxycycline and rifampin presumably some culture grew staph. I do not have this result. He remains on terbinafine although I have used terbinafine on him before 4/6; patient has had [name] fairly substantial reopening on the right foot between the first and second toes. He is finished his terbinafine and I believe is on doxycycline and rifampin still as prescribed by dermatology. We have been using silver collagen to all his wounds although the patient reports that he thinks silver alginate does better on the wounds on his buttock. 4/13; the area on his left lateral calf about the same size but it did not require debridement. ooLeft dorsal foot just proximal to the webspace between the first and second toes is about the same. Still nonviable surface. I note some superficial bronze discoloration of the dorsal part of his foot ooRight dorsal foot just  proximal to the first and second toes also looks about the same. I still think there may be the same discoloration I noted above on the left ooLeft buttock wound looks about the same 4/20; left lateral calf appears to be gradually contracting using silver collagen. 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 The left dorsal foot wound is debrided of tightly adherent necrotic debris and really cleans up quite nicely. The right area is worse with expansion. I did not debride this it is now over the base of the second toe ooThe area on his left buttock is smaller no debridement is required using silver collagen 5/4; left calf continues to make good progress. ooHe arrives with erythema around the wounds on his dorsal foot which even extends to the plantar aspect. Very concerning for coexistent infection. He is finished the erythromycin I gave him for possible erythrasma this  does not seem to have helped. ooThe area on the left foot is about the same base of the dorsal toes ooIs area on the buttock looks improved on the left 5/11; left calf and left buttock continued to make good progress. Left foot is about the same to slightly improved. ooMajor problem is on the right foot. He has not had an x-ray. Deep tissue culture I did last week showed both Enterobacter and E. coli. I did not change the doxycycline I put him on empirically although neither 1 of these were plated to doxycycline. He arrives today with the erythema looking worse on both the dorsal and plantar foot. Macerated skin on the bottom of the foot. he has not been systemically unwell 5/18-Patient returns at 1 week, left calf wound appears to be making some progress, left buttock wound appears slightly worse than last time, left foot wound looks slightly better, right foot redness is marginally better. X-ray of both feet show no air or evidence of osteomyelitis. Patient is  finished his Omnicef and terbinafine. He continues to have macerated skin on the bottom of the left foot as well as right 5/26; left calf wound is better, left buttock wound appears to have multiple small superficial open areas with surrounding macerated skin. X-rays that I did last time showed no evidence of osteomyelitis in either foot. He is finished cefdinir and doxycycline. I do not think that he was on terbinafine. He continues to have [name] large superficial open area on the right foot anterior dorsal and slightly between the first and second toes. I did send him to dermatology 2 months ago or so wondering about whether they would do [name] fungal scraping. I do not believe they did but did do [name] culture. We have been using silver alginate to the toe areas, he has been using antifungals at home topically either ketoconazole or Lotrisone. We are using silver collagen on the left foot, silver alginate on the right, silver collagen on the left lateral leg and silver alginate on the left buttock 6/1; left buttock area is healed. We have the left dorsal foot, left lateral leg and right dorsal foot. We are using silver alginate to the areas on both feet and silver collagen to the area on his left lateral calf 6/8; the left buttock apparently reopened late last week. He is not really sure how this happened. He is tolerating the terbinafine. Using silver alginate to all wounds 6/15; left buttock wound is larger than last week but still superficial. ooCame in the clinic today with [name] report of purulence from the left lateral leg I did not identify any infection ooBoth areas on his dorsal feet appear to be better. He is tolerating the terbinafine. Using silver alginate to all wounds 6/22; left buttock is about the same this week, left calf quite [name] bit better. His left foot is about the same however he comes in with erythema and warmth in the right forefoot once again. Culture that I gave him in the beginning of  May showed Enterobacter and E. coli. I gave him doxycycline and things seem to improve although neither 1 of these organisms was specifically plated. 6/29; left buttock is larger and dry this week. Left lateral calf looks to me to be improved. Left dorsal foot also somewhat improved right foot completely unchanged. The erythema on the right foot is still present. He is completing the Ceftin dinner that I gave him empirically [see discussion above.) 7/6 - All wounds look  to be stable and perhaps improved, the left buttock wound is slightly smaller, per patient bleeds easily, completed ceftin, the right foot redness is less, he is on terbinafine 7/13; left buttock wound about the same perhaps slightly narrower. Area on the left lateral leg continues to narrow. Left dorsal foot slightly smaller right foot about the same. We are using silver alginate on the right foot and Hydrofera Blue to the areas on the left. Unna boot on the left 2 layer compression on the right 7/20; left buttock wound absolutely the same. Area on lateral leg continues to get better. Left dorsal foot require debridement as did the right no major change in the 7/27; left buttock wound the same size necrotic debris over the surface. The area on the lateral leg is closed once again. His left foot looks better right foot about the same although there is some involvement now of the posterior first second toe area. He is still on terbinafine which I have given him for [name] month, not certain [name] centimeter major change 06/25/19-All wounds appear to be slightly improved according to report, left buttock wound looks clean, both foot wounds have minimal to no debris the right dorsal foot has minimal slough. We are using Hydrofera Blue to the left and silver alginate to the right foot and ischial wound. 8/10-Wounds all appear to be around the same, the right forefoot distal part has some redness which was not there before, however the wound looks  clean and small. Ischial wound looks about the same with no changes 8/17; his wound on the left lateral calf which was his original chronic venous insufficiency wound remains closed. Since I last saw him the areas on the left dorsal foot right dorsal foot generally appear better but require debridement. The area on his left initial tuberosity appears somewhat larger to me perhaps hyper granulated and bleeds very easily. We have been using Hydrofera Blue to the left dorsal foot and silver alginate to everything else 8/24; left lateral calf remains closed. The areas on his dorsal feet on the webspace of the first and second toes bilaterally both look better. The area on the left buttock which is the pressure ulcer stage II slightly smaller. I change the dressing to Hydrofera Blue to all areas 8/31; left lateral calf remains closed. The area on his dorsal feet bilaterally look better. Using Hydrofera Blue. Still requiring debridement on the left foot. No change in the left buttock pressure ulcers however 9/14; left lateral calf remains closed. Dorsal feet look quite [name] bit better than 2 [name] ago. Flaking dry skin also [name] lot better with the ammonium lactate I gave him 2 [name] ago. The area on the left buttock is improved. He states that his Roho cushion developed [name] leak and he is getting [name] new one, in the interim he is offloading this vigorously 9/21; left calf remains closed. Left heel which was [name] possible DTI looks better this week. He had macerated tissue around the left dorsal foot right foot looks satisfactory and improved left buttock wound. I changed his dressings to his feet to silver alginate bilaterally. Continuing Hydrofera Blue on the left buttock. 9/28 left calf remains closed. Left heel did not develop anything [possible DTI] dry flaking skin on the left dorsal foot. Right foot looks satisfactory. Improved left buttock wound. We are using silver alginate on his feet Hydrofera Blue on the  buttock. I have asked him to go back to the Lotrisone on his feet including  the wounds and surrounding areas 10/5; left calf remains closed. The areas on the left and right feet about the same. [name] lot of this is epithelialized however debris over the remaining open areas. He is using Lotrisone and silver alginate. The area on the left buttock using Hydrofera Blue 10/26. Patient has been out for 3 [name] secondary to Covid concerns. He tested negative but I think his wife tested positive. He comes in today with the left foot substantially worse, right foot about the same. Even more concerning he states that the area on his left buttock closed over but then reopened and is considerably deeper in one aspect than it was before [stage III wound] 11/2; left foot really about the same as last week. Quarter sized wound on the dorsal foot just proximal to the first second toes. Surrounding erythema with areas of denuded epithelium. This is not really much different looking. Did not look like cellulitis this time however. ooRight foot area about the same.. We have been using silver alginate alginate on his toes ooLeft buttock still substantial irritated skin around the wound which I think looks somewhat better. We have been using Hydrofera Blue here. 11/9; left foot larger than last week and [name] very necrotic surface. Right foot I think is about the same perhaps slightly smaller. Debris around the circumference also addressed. Unfortunately on the left buttock there is been [name] decline. Satellite lesions below the major wound distally and now [name] an additional one posteriorly we have been using Hydrofera Blue but I think this is [name] pressure issue 11/16; left foot ulcer dorsally again [name] very adherent necrotic surface. Right foot is about the same. Not much change in the pressure ulcer on his left buttock. 11/30; left foot ulcer dorsally basically the same as when I saw him 2 [name] ago. Very adherent fibrinous debris on  the wound surface. Patient reports [name] lot of drainage as well. 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 We have been using Iodoflex, patient changed back to alginate because of the drainage. Area on his right dorsal foot really looks benign with [name] healthier surface certainly [name] lot better than on the left. Left buttock wounds all improved using Hydrofera Blue 12/7; left dorsal foot again no improvement. Tightly adherent debris. PCR culture I did last week only showed likely skin contaminant. I have gone ahead and done [name] punch biopsy of this which is about the last thing in terms of investigations I can think to do. He has known venous insufficiency and venous hypertension and this could be the issue here. The area on the right foot is about the same left buttock slightly worse according to our intake nurse secondary to [facility] Blue sticking to the wound 12/14; biopsy of the left foot that I did last time showed changes that could be related to wound healing/chronic stasis dermatitis phenomenon no neoplasm. We have been using silver alginate to both feet. I change the one on the left today to Sorbact and silver alginate to his other 2 wounds 12/28; the patient arrives with the following problems; ooMajor issue is the dorsal left foot which continues to be [name] larger deeper wound area. Still with [name] completely nonviable surface ooParadoxically the area mirror image on the right on the right dorsal foot appears to be getting better. ooHe had some loss of dry denuded skin from the lower part of his original wound on the left lateral calf. Some of this  area looked [name] little vulnerable and for this reason we put him in wrap that on this side this week ooThe area on his left buttock is larger. He still has the erythematous circular area which I think is [name] combination of pressure, sweat. This does not look like cellulitis or fungal  dermatitis 11/26/2019; -Dorsal left foot large open wound with depth. Still debris over the surface. Using Sorbact ooThe area on the dorsal right foot paradoxically has closed over [facility] has [name] reopening on the left ankle laterally at the base of his original wound that extended up into the calf. This appears clean. ooThe left buttock wound is smaller but with very adherent necrotic debris over the surface. We have been using silver alginate here as well The patient had arterial studies done in 2017. He had biphasic waveforms at the dorsalis pedis and posterior tibial bilaterally. ABI in the left was 1.17. Digit waveforms were dampened. He has slight spasticity in the great toes I do not think [name] TBI would be possible 1/11; the patient comes in today with [name] sizable reopening between the first and second toes on the right. This is not exactly in the same location where we have been treating wounds previously. According to our intake nurse this was actually fairly deep but 0.6 cm. The area on the left dorsal foot looks about the same the surface is somewhat cleaner using Sorbact, his MRI is in 2 days. We have not managed yet to get arterial studies. The new reopening on the left lateral calf looks somewhat better using alginate. The left buttock wound is about the same using alginate 1/18; the patient had his ARTERIAL studies which were quite normal. ABI in the right at 1.13 with triphasic/biphasic waveforms on the left ABI 1.06 again with triphasic/biphasic waveforms. It would not have been possible to have done [name] toe brachial index because of spasticity. We have been using Sorbac to the left foot alginate to the rest of his wounds on the right foot left lateral calf and left buttock 1/25; arrives in clinic with erythema and swelling of the left forefoot worse over the first MTP area. This extends laterally dorsally and but also posteriorly. Still has an area on the left lateral part of the lower part  of his calf wound it is eschared and clearly not closed. ooArea on the left buttock still with surrounding irritation and erythema. ooRight foot surface wound dorsally. The area between the right and first and second toes appears better. 2/1; ooThe left foot wound is about the same. Erythema slightly better I gave him [name] week of doxycycline empirically ooRight foot wound is more extensive extending between the toes to the plantar surface ooLeft lateral calf really no open surface on the inferior part of his original wound however the entire area still looks vulnerable ooAbsolutely no improvement in the left buttock wound required debridement. 2/8; the left foot is about the same. Erythema is slightly improved I gave him clindamycin last week. ooRight foot looks better he is using Lotrimin and silver alginate ooHe has [name] breakdown in the left lateral calf. Denuded epithelium which I have removed ooLeft buttock about the same were using Hydrofera Blue 2/15; left foot is about the same there is less surrounding erythema. Surface still has tightly adherent debris which I have debriding however not making any progress ooRight foot has [name] substantial wound on the medial right second toe between the first and second webspace. ooStill an open area on the  left lateral calf distal area. ooButtock wound is about the same 2/22; left foot is about the same less surrounding erythema. Surface has adherent debris. Polymen Ag Right foot area significant wound between the first and second toes. We have been using silver alginate here Left lateral leg polymen Ag at the base of his original venous insufficiency wound ooLeft buttock some improvement here 3/1; ooRight foot is deteriorating in the first second toe webspace. Larger and more substantial. We have been using silver alginate. ooLeft dorsal foot about the same markedly adherent surface debris using PolyMem Ag ooLeft lateral calf surface  debris using PolyMem AG ooLeft buttock is improved again using PolyMem Ag. ooHe is completing his terbinafine. The erythema in the foot seems better. He has been on this for 2 [name] 3/8; no improvement in any wound area in fact he has [name] small open area on the dorsal midfoot which is new this week. He has not gotten his foot x-rays yet 3/15; his x-rays were both negative for osteomyelitis of both feet. No major change in any of his wounds on the extremities however his buttock wounds are better. We have been using polymen on the buttocks, left lower leg. Iodoflex on the left foot and silver alginate on the right 3/22; arrives in clinic today with the 2 major issues are the improvement in the left dorsal foot wound which for once actually looks healthy with [name] nice healthy wound surface without debridement. Using Iodoflex here. Unfortunately on the left lateral calf which is in the distal part of his original wound he came to the clinic here for there was purulent drainage noted some increased breakdown scattered around the original area and [name] small area proximally. We we are using polymen here will change to silver alginate today. His buttock wound on the left is better and I think the area on the right first second toe webspace is also improved 3/29; left dorsal foot looks better. Using Iodoflex. Left ankle culture from deterioration last time grew E. coli, Enterobacter and Enterococcus. I will give him [name] course of cefdinir although that will not cover Enterococcus. The area on the right foot in the webspace of the first and second toe lateral first toe looks better. The area on his buttock is about healed Vascular appointment is on April 21. This is to look at his venous system vis--vis continued breakdown of the wounds on the left including the left lateral leg and left dorsal foot he. He has had previous ablations on this side 4/5; the area between the right first and second toes lateral aspect  of the first toe looks better. Dorsal aspect of the left first toe on the left foot also improved. Unfortunately the left lateral lower leg is larger and there is [name] second satellite wound superiorly. The usual superficial abrasions on the left buttock overall better but certainly not closed 4/12; the area between the right first and second toes is improved. Dorsal aspect of the left foot also slightly smaller with [name] vibrant healthy looking surface. No real change in the left lateral leg and the left buttock wound is healed He has an unaffordable co-pay for Apligraf. Appointment with vein and vascular with regards to the left leg venous part of the circulation is on 4/21 4/19; we continue to see improvement in all wound areas. Although this is minor. He has his vascular appointment on 4/21. The area on the left buttock has not reopened although right in the center of this  area the skin looks somewhat threatened 4/26; the left buttock is unfortunately reopened. In general his left dorsal foot has [name] healthy surface and looks somewhat smaller although it was not measured as such. The area between his first and second toe webspace on the right as [name] small wound against the first toe. The patient saw vascular surgery. The real question I was asking was about the small saphenous vein on the left. He has previously ablated left greater saphenous vein. Nothing further was commented on on the left. Right greater saphenous vein without reflux at the saphenofemoral junction or proximal thigh there was no indication for ablation of the right greater saphenous vein duplex was negative for DVT bilaterally. They did not think there was anything from [name] vascular surgery point of view that could be offered. They ABIs within normal limits 5/3; only small open area on the left buttock. The area on the left lateral leg which was his original venous reflux is now 2 wounds both which look clean. We are using Iodoflex on the  left dorsal foot which looks healthy and smaller. He is down to [name] very tiny area between the right first and second toes, using silver alginate 5/10; all of his wounds appear better. We have much better edema control in 4 layer compression on the left. This may be the factor that is allowing the left foot and left lateral calf to heal. He has external compression garments at home 04/14/20-All of his wounds are progressing well, the left forefoot is practically closed, left ischium appears to be about the same, right toe webspace is also smaller. The left lateral leg is about the same, continue using Hydrofera Blue to this, silver alginate to the ischium, Iodoflex to the toe space on the right 6/7; most of his wounds outside of the left buttock are doing well. The area on the left lateral calf and left dorsal foot are smaller. The area on the right foot in between the first and second toe webspace is barely visible although he still says there is some drainage here is the only reason I did not heal this out. ooUnfortunately the area on the left buttock almost looks like he has [name] skin tear from tape. He has open wound and then [name] large flap of skin that we are trying to get adherence over an area just next to the remaining wound 6/21; 2 week follow-up. I believe is been here for nurse visits. Miraculously the area between his first and second toes on the left dorsal foot is closed over. Still open on the right first second web space. The left lateral calf has 2 open areas. Distally this is more superficial. The proximal area had [name] little more depth and required debridement of adherent necrotic material. His buttock wound is actually larger we have been using silver alginate here 6/28; the patient's area on the left foot remains closed. Still open wet area between the first and second toes on the right and also extending into the plantar aspect. We have been using silver alginate in this location. He has 2  areas on the left lower leg part of his original long wounds which I think are better. We have been using Hydrofera Blue here. Hydrofera Blue to the left buttock which is stable 7/12; left foot remains closed. Left ankle is closed. May be [name] small area between his right first and second toes the only truly open area is on the left buttock. We have been  using Hydrofera Blue here 7/19; patient arrives with marked deterioration especially in the left foot and ankle. We did not put him in [name] compression wrap on the left last week in fact he wore his juxta lite stockings on either side although he does not have an underlying stocking. He has [name] reopening on the left dorsal foot, left lateral ankle and [name] new area on the right dorsal ankle. More worrisome is the degree of erythema on the left foot extending on the lateral foot into the lateral lower leg on the left 7/26; the patient had erythema and drainage from the lateral left ankle last week. Culture of this grew MRSA resistant to doxycycline and clindamycin which are the 2 antibiotics we usually use with this patient who has multiple antibiotic allergies including linezolid, trimethoprim sulfamethoxazole. I had give him an empiric doxycycline and he comes in the area certainly looks somewhat better although it is blotchy in his lower leg. He has not been systemically unwell. He has had areas on the left dorsal foot which is [name] reopening, chronic wounds on the left lateral ankle. Both of these I think are secondary to chronic venous insufficiency. The area between his first and second toes is closed as far as I can tell. He had [name] new wrap injury on the right dorsal ankle last week. Finally he has an area on the left buttock. We have been using silver alginate to everything except the left buttock we are using Hydrofera Blue 06/30/20-Patient returns at 1 week, has been given [name] sample dose pack of NUZYRA which is [name] tetracycline derivative [omadacycline], patient  has completed those, we have been using silver alginate to almost all the wounds except the left ischium where we are using Hydrofera Blue all of them look better 8/16; since I last saw the patient he has been doing well. The area on the left buttock, left lateral ankle and left foot are all closed today. He has completed the [place] I gave him last time and tolerated this well. He still has open areas on the right dorsal ankle and in the right first second toe area which we are using silver alginate. 8/23; we put him in his bilateral external compression stockings last week as he did not have anything open on either leg except for concerning area between the right first and second toe. He comes in today with an area on the left dorsal foot slightly more proximal than the original wound, the left lateral foot but this is actually [name] continuation of the area he had on the left lateral ankle from last time. As well he is opened up on the left buttock again. 8/30; comes in today with things looking [name] lot better. The area on the left lower ankle has closed down as has the left foot but with eschar in both areas. The area on the dorsal right ankle is also epithelialized. Very little remaining of the left buttock wound. We have been using silver alginate on all wound areas 9/13; the area in the first second toe webspace on the right has fully epithelialized. He still has some vulnerable epithelium on the right and the ankle and the dorsal foot. He notes weeping. He is using his juxta lite stocking. On the left again the left dorsal foot is closed left lateral ankle is closed. We went to the juxta lite stocking here as well. ooStill vulnerable in the left buttock although only 2 small open areas remain here 9/27; 2-week  follow-up. We did not look at his left leg but the patient says everything is closed. He is [name] bit disturbed by the amount of edema in his left foot he is using juxta lite stockings but asking  about over the toes stockings which would be 30/40, will talk to him next time. According to him there is no open wound on either the left foot or the left ankle/calf He has an open area on the dorsal right calf which I initially point [name] wrap injury. He has superficial remaining wound on the left ischial tuberosity been using silver alginate although he says this sticks to the wound 10/5; we gave him 2-week follow-up but he called yesterday expressing some concerns about his right foot right ankle and the left buttock. He came in early. There is still no open areas on the left leg and that still in his juxta lite stocking 10/11; he only has 1 small area on the left buttock that remains measuring millimeters 1 mm. Still has the same irritated skin in this area. We recommended zinc oxide when this eventually closes and pressure relief is meticulously is he can do this. He still has an area on the dorsal part of his right first through third toes which is [name] bit irritated and still open and on the dorsal ankle near the crease of the ankle. We have been using silver alginate and using his own stocking. He has nothing open on the left leg or foot 10/25; 2-week follow-up. Not nearly as good on the left buttock as I was hoping. For open areas with 5 looking threatened small. He has the erythematous irritated chronic skin in this area. oo1 area on the right dorsal ankle. He reports this area bleeds easily ooRight dorsal foot just proximal to the base of his toes ooWe have been using silver alginate. 11/8; 2-week follow-up. Left buttock is about the same although I do not think the wounds are in the same location we have been using silver alginate. I have asked him to use zinc oxide on the skin around the wounds. ooHe still has [name] small area on the right dorsal ankle he reports this bleeds easily ooRight dorsal foot just proximal to the base of the toes does not have anything open although the skin is  very dry and scaly ooHe has [name] new opening on the nailbed of the left great toe. Nothing on the left ankle 11/29; 3-week follow-up. Left buttock has 2 open areas. And washing of these wounds today started bleeding easily. Suggesting very friable tissue. We have been using silver alginate. Right dorsal ankle which I thought was initially [name] wrap injury we have been using silver alginate. Nothing open between the toes that I can see. He states the area on the left dorsal toe nailbed healed after the last visit in 2 or 3 days 12/13; 3-week follow-up. His left buttock now has 3 open areas but the original 2 areas are smaller using polymen here. Surrounding skin looks better. The right dorsal ankle is closed. He has [name] small opening on the right dorsal foot at the level of the third toe. In general the skin looks better here. He is wearing his juxta lite stocking on the left leg says there is nothing open 11/24/2020; 3 [name] follow-up. His left buttock still has the 3 open areas. We have been using polymen but due to lack of response he changed to [facility] area. Surrounding skin is dry erythematous and irritated  looking. There is no evidence of infection either bacterial or fungal however there is loss of surface epithelium ooHe still has very dry skin in his foot causing irritation and erythema on the dorsal part of his toes. This is not responded to prolonged courses of antifungal simply looks dry and irritated 1/24; left buttock area still looks about the same he was unable to find the triad ointment that we had suggested. The area on the right lower leg just above the dorsal ankle has reopened and the areas on the right foot between the first second and second third toes and scaling on the bottom of the foot has been about the same for quite some time now. been using silver alginate to all wound areas 2/7; left buttock wound looked quite good although not much smaller in terms of surface area  surrounding skin looks better. Only [name] few dry flaking areas on the right foot in between the first and second toes the skin generally looks better here [ammonium lactate]. Finally the area on the right dorsal ankle is closed 2/21; ooThere is no open area on the right foot even between the right first and second toe. Skin around this area dorsally and plantar aspects look better. ooHe has [name] reopening of the area on the right ankle just above the crease of the ankle dorsally. I continue to think that this is probably friction from spasms may be even this time with his stocking under the compression stockings. ooWounds on his left buttock look about the same there [name] couple of areas that have reopened. He has [name] total square area of loss of epithelialization. This does not look like infection it looks like [name] contact dermatitis but I just cannot determine to what 3/14; there is nothing on the right foot between the first and second toes this was carefully inspected under illumination. Some chronic irritation on the dorsal part of his foot from toes 1-3 at the base. Nothing really open here substantially. Still has an area on the right foot/ankle that is actually larger and hyper granulated. His buttock area on the left is just about closed however he has chronic inflammation with loss of the surface epithelial layer 3/28; 2-week follow-up. In clinic today with [name] new wound on the left anterior mid tibia. Says this happened about 2 [name] ago. He is not really sure how wonders about the spasticity of his legs at night whether that could have caused this other than that he does not have [name] good idea. He has been using topical antibiotics and silver alginate. The area on his right dorsal ankle seems somewhat better. ooFinally everything on his left buttock is closed. 4/11; 2-week follow-up. All of his wounds are better except for the area over the ischium and left buttock which have opened up widely again.  At least part of this is covered in necrotic fibrinous material another part had rolled nonviable skin. The area on the right ankle, left anterior mid tibia are both [name] lot better. He had no open wounds on either foot including the areas between the first and second toes 4/25; patient presents for 2-week follow-up. He states that the wounds are overall stable. He has no complaints today and states he is using Hydrofera Blue to open wounds. 5/9; have not seen this man in over [name] month. For my memory he has open areas on the left mid tibia and right ankle. T oday he has new open area on the right dorsal foot  which we have not had [name] problem with recently. He has the sustained area on the left buttock He is also changed his insurance at the beginning of the year [employer]. We will need prior authorizations for debridement Objective Constitutional Sitting or standing Blood Pressure is within target range for patient.. Pulse regular and within target range for patient.Marland Kitchen Respirations regular, non-labored and within target range.. Temperature is normal and within the target range for the patient.. Vitals Time Taken: 8:10 AM, Height: 70 in, Weight: 216 lbs, BMI: 31, Temperature: 97.7 F, Pulse: 130 bpm, Respiratory Rate: 18 breaths/min, Blood Pressure: 120/80 mmHg. Cardiovascular Pedal pulses palpable. Edema control is reasonably good.. General Notes: Wound exam; the left ischium seems back to its usual location and presentation seems superficial no debridement. oo He has an area on the right dorsal ankle. Clearly open wound superficial wound. oo An area on the left anterior mid tibia which I think was new last time he was here also superficial oo He has [name] horizontal wound on his dorsal right foot this is definitely new which she says opened several days ago. Integumentary (Hair, Skin) Wound #41R status is Open. Original cause of wound was Gradually Appeared. The date acquired was: 03/16/2020. The  wound has been in treatment 54 [name]. The wound is located on the Left Ischium. The wound measures 2.5cm length x 2.5cm width x 0.1cm depth; 4.909cm^2 area and 0.491cm^3 volume. There is Fat Layer (Subcutaneous Tissue) exposed. There is no tunneling or undermining noted. There is [name] medium amount of serosanguineous drainage noted. The wound margin is distinct with the outline attached to the wound base. There is large (67-100%) red, pink, friable granulation within the wound bed. There is no necrotic tissue within the wound bed. Wound #50 status is Open. Original cause of wound was Gradually Appeared. The date acquired was: 01/12/2021. The wound has been in treatment 11 [name]. The wound is located on the Right,Dorsal Ankle. The wound measures 0.5cm length x 0.5cm width x 0.1cm depth; 0.196cm^2 area and 0.02cm^3 volume. There is Fat Layer (Subcutaneous Tissue) exposed. There is no tunneling or undermining noted. There is [name] medium amount of serosanguineous drainage noted. The wound margin is distinct with the outline attached to the wound base. There is large (67-100%) red, friable granulation within the wound bed. There is no necrotic tissue within the wound bed. Wound #51 status is Open. Original cause of wound was Trauma. The date acquired was: 01/26/2021. The wound has been in treatment 6 [name]. The wound is located on the Left,Anterior Lower Leg. The wound measures 0.7cm length x 0.6cm width x 0.1cm depth; 0.33cm^2 area and 0.033cm^3 volume. There is Fat Layer (Subcutaneous Tissue) exposed. There is no tunneling or undermining noted. There is [name] medium amount of serosanguineous drainage noted. The wound margin is well defined and not attached to the wound base. There is large (67-100%) red, friable granulation within the wound bed. There is no necrotic tissue within the wound bed. Wound #52 status is Open. Original cause of wound was Gradually Appeared. The date acquired was: 03/27/2021. The wound is  located on the Right,Dorsal Foot. The wound measures 0.6cm length x 4cm width x 0.1cm depth; 1.885cm^2 area and 0.188cm^3 volume. There is Fat Layer (Subcutaneous Tissue) exposed. There is no tunneling or undermining noted. There is [name] medium amount of sanguinous drainage noted. The wound margin is distinct with the outline attached to the wound base. There is large (67-100%) red, friable granulation within the wound bed.  There is no necrotic tissue within the wound bed. 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 Bathing/ Shower/ Hygiene: May shower and wash wound with soap and water. - on days that dressing is changed Edema Control - Lymphedema / SCD / Other: Elevate legs to the level of the heart or above for 30 minutes daily and/or when sitting, [name] frequency of: - throughout the day Compression stocking or Garment 30-40 mm/Hg pressure to: - Juxtalite to both legs daily Off-Loading: Roho cushion for wheelchair Turn and reposition every 2 hours WOUND #41R: - Ischium Wound Laterality: Left Cleanser: Soap and Water Every Other Day/30 Days Discharge Instructions: May shower and wash wound with dial antibacterial soap and water prior to dressing change. Prim Dressing: Hydrofera Blue Ready Foam, 4x5 in Every Other Day/30 Days ary Discharge Instructions: Apply to wound bed as instructed Secondary Dressing: ComfortFoam Border, 4x4 in (silicone border) Every Other Day/30 Days Discharge Instructions: Apply over primary dressing as directed. WOUND #50: -  Ankle Wound Laterality: Dorsal, Right Cleanser: Wound Cleanser Every Other Day/30 Days Discharge Instructions: Cleanse the wound with wound cleanser prior to applying [name] clean dressing using gauze sponges, not tissue or cotton balls. Cleanser: Soap and Water Every Other Day/30 Days Discharge Instructions: May shower and wash wound with dial antibacterial soap and water prior to dressing change. Prim Dressing: Hydrofera Blue Ready Foam, 2.5 x2.5 in Every Other Day/30 Days ary Discharge Instructions: Apply to wound bed as instructed Secondary Dressing: ComfortFoam Border, 4x4 in (silicone border) Every Other Day/30 Days Discharge Instructions: Apply over primary dressing as directed. WOUND #51: - Lower Leg Wound Laterality: Left, Anterior Cleanser: Wound Cleanser Every Other Day/30 Days Discharge Instructions: Cleanse the wound with wound cleanser prior to applying [name] clean dressing using gauze sponges, not tissue or cotton balls. Cleanser: Soap and Water Every Other Day/30 Days Discharge Instructions: May shower and wash wound with dial antibacterial soap and water prior to dressing change. Prim Dressing: Hydrofera Blue Ready Foam, 2.5 x2.5 in Every Other Day/30 Days ary Discharge Instructions: Apply to wound bed as instructed Secondary Dressing: ComfortFoam Border, 4x4 in (silicone border) Every Other Day/30 Days Discharge Instructions: Apply over primary dressing as directed. WOUND #52: - Foot Wound Laterality: Dorsal, Right Cleanser: Wound Cleanser Every Other Day/30 Days Discharge Instructions: Cleanse the wound with wound cleanser prior to applying [name] clean dressing using gauze sponges, not tissue or cotton balls. Cleanser: Soap and Water Every Other Day/30 Days Discharge Instructions: May shower and wash wound with dial antibacterial soap and water prior to dressing change. Prim Dressing: Hydrofera Blue Ready Foam, 2.5 x2.5 in Every Other Day/30 Days ary Discharge Instructions: Apply to  wound bed as instructed Secondary Dressing: Woven Gauze Sponge, Non-Sterile 4x4 in Every Other Day/30 Days Discharge Instructions: Apply over primary dressing as directed. Secured With: [employer], 4.5x3.1 (in/yd) Every Other Day/30 Days Discharge Instructions: Secure with Kerlix as directed. Secured With: 59M Medipore H Soft Cloth Surgical T ape, 2x2 (in/yd) Every Other Day/30 Days Discharge Instructions: Secure dressing with tape as directed. #1 I put Hydrofera Blue 1 to all wound areas under his  juxta lite bilateral stockings at 30/40 2. I think some of the wounds including the left anterior mid tibia right dorsal ankle and maybe the right dorsal foot are traumatic wounds related to his muscle spasms. He also has chronic venous insufficiency Electronic Signature(s) Signed: 03/30/2021 4:39:47 PM By: Linton Ham MD Entered By: Linton Ham on 03/30/2021 09:14:34 -------------------------------------------------------------------------------- SuperBill Details Patient Name: Date of Service: [name], [name] [name] 03/30/2021 Medical Record Number: [id number] Patient Account Number: [id number] Date of Birth/Sex: Treating RN: [birth year] (35 y.o. [name] Primary Care Provider: [place], [place] Other Clinician: Referring Provider: Treating Provider/Extender: [name] in Treatment: 273 Diagnosis Coding ICD-10 Codes Code Description I87.332 Chronic venous hypertension (idiopathic) with ulcer and inflammation of left lower extremity L97.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, 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, complete Facility Procedures CPT4 Code: [id number] Description: [id number] - WOUND CARE VISIT-LEV 5 EST PT Modifier: Quantity: 1 Physician Procedures : CPT4 Code Description Modifier [id number] - WC PHYS LEVEL 3 - EST PT ICD-10 Diagnosis Description L97.511 Non-pressure  chronic ulcer of other part of right foot limited to breakdown of skin I87.332 Chronic venous hypertension (idiopathic) with ulcer  and inflammation of left lower extremity L89.323 Pressure ulcer of left buttock, stage 3 Quantity: 1 Electronic Signature(s) Signed: 03/30/2021 4:39:47 PM By: Linton Ham MD Entered By: Linton Ham on 03/30/2021 09:14:55

## 2021-03-30 NOTE — Progress Notes (Addendum)
[name], [name] ([id number]) Visit Report for 03/30/2021 Arrival Information Details Patient Name: Date of Service: [name], [name] 03/30/2021 8:00 [facility] Record Number: [id number] Patient Account Number: [id number] Date of Birth/Sex: Treating RN: 1985/12/13 (35 y.o. [name] Primary Care Lamiyah Schlotter: [place], [place] Other Clinician: Referring Indra Wolters: Treating Chuck Caban/Extender: [name]: 70 Visit Information History Since Last Visit Added or deleted any medications: No Patient Arrived: Wheel Chair Any new allergies or adverse reactions: No Arrival Time: 08:08 Had [name] fall or experienced change in No Transfer Assistance: None activities of daily living that may affect Patient Identification Verified: Yes risk of falls: Secondary Verification Process Completed: Yes Signs or symptoms of abuse/neglect since last visito No Patient Requires Transmission-Based Precautions: No Hospitalized since last visit: No Patient Has Alerts: Yes Implantable device outside of the clinic excluding No Patient Alerts: R ABI = 1.0 cellular tissue based products placed in the center L ABI = 1.1 since last visit: Has Dressing in Place as Prescribed: Yes Has Compression in Place as Prescribed: Yes Pain Present Now: No Electronic Signature(s) Signed: 03/30/2021 5:46:23 PM By: Lorrin Jackson Entered By: Lorrin Jackson on 03/30/2021 08:08:48 -------------------------------------------------------------------------------- Clinic Level of Care Assessment Details Patient Name: Date of Service: [name], [name] 03/30/2021 8:00 [facility] Record Number: [id number] Patient Account Number: [id number] Date of Birth/Sex: Treating RN: [birth year] (35 y.o. [name] Primary Care Jayce Boyko: [place], [place] Other Clinician: Referring Lem Peary: Treating Haeden Hudock/Extender: [name]: 29 Clinic Level of Care Assessment Items TOOL 4  Quantity Score X- 1 0 Use when only an EandM is performed on FOLLOW-UP visit ASSESSMENTS - Nursing Assessment / Reassessment X- 1 10 Reassessment of Co-morbidities (includes updates in patient status) X- 1 5 Reassessment of Adherence to Treatment Plan ASSESSMENTS - Wound and Skin [name] ssessment / Reassessment []  - 0 Simple Wound Assessment / Reassessment - one wound X- 4 5 Complex Wound Assessment / Reassessment - multiple wounds []  - 0 Dermatologic / Skin Assessment (not related to wound area) ASSESSMENTS - Focused Assessment []  - 0 Circumferential Edema Measurements - multi extremities []  - 0 Nutritional Assessment / Counseling / Intervention X- 1 5 Lower Extremity Assessment (monofilament, tuning fork, pulses) []  - 0 Peripheral Arterial Disease Assessment (using hand held doppler) ASSESSMENTS - Ostomy and/or Continence Assessment and Care []  - 0 Incontinence Assessment and Management []  - 0 Ostomy Care Assessment and Management (repouching, etc.) PROCESS - Coordination of Care X - Simple Patient / Family Education for ongoing care 1 15 []  - 0 Complex (extensive) Patient / Family Education for ongoing care X- 1 10 Staff obtains Programmer, systems, Records, T Results / Process Orders est []  - 0 Staff telephones HHA, Nursing Homes / Clarify orders / etc []  - 0 Routine Transfer to another Facility (non-emergent condition) []  - 0 Routine Hospital Admission (non-emergent condition) []  - 0 New Admissions / Biomedical engineer / Ordering NPWT Apligraf, etc. , []  - 0 Emergency Hospital Admission (emergent condition) X- 1 10 Simple Discharge Coordination []  - 0 Complex (extensive) Discharge Coordination PROCESS - Special Needs []  - 0 Pediatric / Minor Patient Management []  - 0 Isolation Patient Management []  - 0 Hearing / Language / Visual special needs []  - 0 Assessment of Community assistance (transportation, D/C planning, etc.) []  - 0 Additional assistance / Altered  mentation []  - 0 Support Surface(s) Assessment (bed, cushion, seat, etc.) INTERVENTIONS - Wound Cleansing / Measurement []  - 0 Simple Wound  Cleansing - one wound X- 4 5 Complex Wound Cleansing - multiple wounds X- 1 5 Wound Imaging (photographs - any number of wounds) []  - 0 Wound Tracing (instead of photographs) []  - 0 Simple Wound Measurement - one wound X- 4 5 Complex Wound Measurement - multiple wounds INTERVENTIONS - Wound Dressings X - Small Wound Dressing one or multiple wounds 4 10 []  - 0 Medium Wound Dressing one or multiple wounds []  - 0 Large Wound Dressing one or multiple wounds []  - 0 Application of Medications - topical []  - 0 Application of Medications - injection INTERVENTIONS - Miscellaneous []  - 0 External ear exam []  - 0 Specimen Collection (cultures, biopsies, blood, body fluids, etc.) []  - 0 Specimen(s) / Culture(s) sent or taken to Lab for analysis []  - 0 Patient Transfer (multiple staff / Civil Service fast streamer / Similar devices) []  - 0 Simple Staple / Suture removal (25 or less) []  - 0 Complex Staple / Suture removal (26 or more) []  - 0 Hypo / Hyperglycemic Management (close monitor of Blood Glucose) []  - 0 Ankle / Brachial Index (ABI) - do not check if billed separately X- 1 5 Vital Signs Has the patient been seen at the hospital within the last three years: Yes Total Score: 165 Level Of Care: New/Established - Level 5 Electronic Signature(s) Signed: 03/30/2021 5:39:20 PM By: Levan Hurst RN, BSN Entered By: Levan Hurst on 03/30/2021 09:04:24 -------------------------------------------------------------------------------- Encounter Discharge Information Details Patient Name: Date of Service: [name], [name] 03/30/2021 8:00 [name] Medical Record Number: [id number] Patient Account Number: [id number] Date of Birth/Sex: Treating RN: 04/25/1986 (35 y.o. [name] Primary Care Kenyah Luba: [place], [place] Other Clinician: Referring Narayan Scull: Treating  Sacheen Arrasmith/Extender: [name]: 273 Encounter Discharge Information Items Discharge Condition: Stable Ambulatory Status: Wheelchair Discharge Destination: Home Transportation: Private Auto Accompanied By: alone Schedule Follow-up Appointment: Yes Clinical Summary of Care: Patient Declined Electronic Signature(s) Signed: 03/30/2021 5:39:20 PM By: Levan Hurst RN, BSN Entered By: Levan Hurst on 03/30/2021 10:26:04 -------------------------------------------------------------------------------- Lower Extremity Assessment Details Patient Name: Date of Service: [name], [name] 03/30/2021 8:00 [name] Medical Record Number: [id number] Patient Account Number: [id number] Date of Birth/Sex: Treating RN: 25-May-1986 (35 y.o. [name] Primary Care Rodrickus Min: [place], [place] Other Clinician: Referring Raimi Guillermo: Treating Quante Pettry/Extender: Dutch Gray, [name]: 273 Edema Assessment Assessed: [Left: Yes] [Right: Yes] Edema: [Left: Yes] [Right: Yes] Calf Left: Right: Point of Measurement: 33 cm From Medial Instep 27 cm 32.5 cm Ankle Left: Right: Point of Measurement: 10 cm From Medial Instep 22.5 cm 23 cm Vascular Assessment Pulses: Dorsalis Pedis Palpable: [Left:Yes] [Right:Yes] Electronic Signature(s) Signed: 03/30/2021 5:46:23 PM By: Lorrin Jackson Entered By: Lorrin Jackson on 03/30/2021 08:16:54 -------------------------------------------------------------------------------- Multi Wound Chart Details Patient Name: Date of Service: [name], [name] 03/30/2021 8:00 [name] Medical Record Number: [id number] Patient Account Number: [id number] Date of Birth/Sex: Treating RN: September 27, 1986 (35 y.o. [name] Primary Care Sejal Cofield: [name], [name] Other Clinician: Referring Mareon Robinette: Treating Demeshia Sherburne/Extender: [name]: 273 Vital Signs Height(in): 70 Pulse(bpm):  130 Weight(lbs): 216 Blood Pressure(mmHg): 120/80 Body Mass Index(BMI): 31 Temperature(F): 97.7 Respiratory Rate(breaths/min): 18 Photos: [41R:No Photos Left Ischium] [50:No Photos Right, Dorsal Ankle] [51:No Photos Left, Anterior Lower Leg] Wound Location: [41R:Gradually Appeared] [50:Gradually Appeared] [51:Trauma] Wounding Event: [41R:Pressure Ulcer] [50:Venous Leg Ulcer] [51:Trauma, Other] Primary Etiology: [41R:Sleep Apnea, Hypertension, Paraplegia Sleep Apnea, Hypertension, Paraplegia Sleep Apnea, Hypertension, Paraplegia] Comorbid History: [41R:03/16/2020] [50:01/12/2021] [51:01/26/2021] Date Acquired: [41R:54] [  50:11] [51:6] Weeks of Treatment: [41R:Open] [50:Open] [51:Open] Wound Status: [41R:Yes] [50:No] [51:No] Wound Recurrence: [41R:Yes] [50:No] [51:No] Clustered Wound: [41R:2] [50:[name]] [51:[name]] Clustered Quantity: [41R:2.5x2.5x0.1] [50:0.5x0.5x0.1] [51:0.7x0.6x0.1] Measurements L x W x D (cm) [41R:4.909] [50:0.196] [51:0.33] [name] (cm) : rea [41R:0.491] [50:0.02] [51:0.033] Volume (cm) : [41R:-171.80%] [50:89.60%] [51:0.00%] % Reduction in [name] rea: [41R:-171.30%] [50:94.70%] [51:0.00%] % Reduction in Volume: [41R:Category/Stage II] [50:Full Thickness Without Exposed] [51:Full Thickness Without Exposed] Classification: [41R:Medium] [50:Support Structures Medium] [51:Support Structures Medium] Exudate Amount: [41R:Serosanguineous] [50:Serosanguineous] [51:Serosanguineous] Exudate Type: [41R:red, brown] [50:red, brown] [51:red, brown] Exudate Color: [41R:Distinct, outline attached] [50:Distinct, outline attached] [51:Well defined, not attached] Wound Margin: [41R:Large (67-100%)] [50:Large (67-100%)] [51:Large (67-100%)] Granulation Amount: [41R:Red, Pink, Friable] [50:Red, Friable] [51:Red, Friable] Granulation Quality: [41R:None Present (0%)] [50:None Present (0%)] [51:None Present (0%)] Necrotic Amount: [41R:Fat Layer (Subcutaneous Tissue): Yes Fat Layer (Subcutaneous Tissue):  Yes Fat Layer (Subcutaneous Tissue): Yes] Exposed Structures: [41R:Fascia: No Tendon: No Muscle: No Joint: No Bone: No Small (1-33%)] [50:Fascia: No Tendon: No Muscle: No Joint: No Bone: No Medium (34-66%)] [51:Fascia: No Tendon: No Muscle: No Joint: No Bone: No Small (1-33%)] Wound Number: 52 [name] [name] Photos: No Photos [name] [name] Right, Dorsal Foot [name] [name] Wound Location: Gradually Appeared [name] [name] Wounding Event: Trauma, Other [name] [name] Primary Etiology: Sleep Apnea, Hypertension, Paraplegia [name] [name] Comorbid History: 03/27/2021 [name] [name] Date Acquired: 0 [name] [name] Weeks of Treatment: Open [name] [name] Wound Status: No [name] [name] Wound Recurrence: No [name] [name] Clustered Wound: [name] [name] [name] Clustered Quantity: 0.6x4x0.1 [name] [name] Measurements L x W x D (cm) 1.885 [name] [name] [name] (cm) : rea 0.188 [name] [name] Volume (cm) : [name] [name] [name] % Reduction in Area: [name] [name] [name] % Reduction in Volume: Full Thickness Without Exposed [name] [name] Classification: Support Structures Medium [name] [name] Exudate Amount: Sanguinous [name] [name] Exudate Type: red [name] [name] Exudate Color: Distinct, outline attached [name] [name] Wound Margin: Large (67-100%) [name] [name] Granulation Amount: Red, Friable [name] [name] Granulation Quality: None Present (0%) [name] [name] Necrotic Amount: Fat Layer (Subcutaneous Tissue): Yes [name] [name] Exposed Structures: Fascia: No Tendon: No Muscle: No Joint: No Bone: No None [name] [name] Epithelialization: Treatment Notes Electronic Signature(s) Signed: 03/30/2021 4:39:47 PM By: Linton Ham MD Signed: 03/30/2021 5:39:20 PM By: Levan Hurst RN, BSN Entered By: Linton Ham on 03/30/2021 09:08:25 -------------------------------------------------------------------------------- Multi-Disciplinary Care Plan Details Patient Name: Date of Service: [name], [name] 03/30/2021 8:00 [name] Medical Record Number: [id number] Patient Account Number: [id number] Date of Birth/Sex: Treating RN: August 09, 1986 (35 y.o. [name] Primary Care Emilija Bohman: [name], [name] Other Clinician: Referring Darrio Bade: Treating Shealyn Sean/Extender: [name]: [place] reviewed with physician Active Inactive Wound/Skin Impairment Nursing Diagnoses: Impaired tissue integrity Knowledge deficit related to ulceration/compromised skin integrity Goals: Patient/caregiver will verbalize understanding of skin care regimen Date Initiated: 01/05/2016 Target Resolution Date: 04/13/2021 Goal Status: Active Ulcer/skin breakdown will have [name] volume reduction of 30% by week 4 Date Initiated: 01/05/2016 Date Inactivated: 12/22/2017 Target Resolution Date: 01/19/2018 Unmet Reason: complex wounds, Goal Status: Unmet infection Interventions: Assess patient/caregiver ability to obtain necessary supplies Assess ulceration(s) every visit Provide education on ulcer and skin care Notes: 02/02/21: Complex Care, ongoing. Electronic Signature(s) Signed: 03/30/2021 5:39:20 PM By: Levan Hurst RN, BSN Entered By: Levan Hurst on 03/30/2021 09:03:25 -------------------------------------------------------------------------------- Pain Assessment Details Patient Name: Date of Service: [name], [name] 03/30/2021 8:00 [name] Medical Record Number: [id number] Patient Account Number: [id number] Date of Birth/Sex: Treating RN: [birth year] (35 y.o. [name])  Lorrin Jackson Primary Care Tymeka Privette: [place], [place] Other Clinician: Referring Tonae Livolsi: Treating Verdell Dykman/Extender: [name]: 273 Active Problems Location of Pain Severity and Description of Pain Patient Has Paino No Site Locations Pain Management and Medication Current Pain Management: Electronic Signature(s) Signed: 03/30/2021 5:46:23 PM By: Lorrin Jackson Entered By: Lorrin Jackson on 03/30/2021  08:10:06 -------------------------------------------------------------------------------- Patient/Caregiver Education Details Patient Name: Date of Service: [name], [name] 5/9/2022andnbsp8:00 [name] Medical Record Number: [id number] Patient Account Number: [id number] Date of Birth/Gender: Treating RN: 11/07/1986 (35 y.o. [name] Primary Care Physician: Janine Limbo Other Clinician: Referring Physician: Treating Physician/Extender: [name]: 273 Education Assessment Education Provided To: Patient Education Topics Provided Wound/Skin Impairment: Methods: Explain/Verbal Responses: State content correctly Electronic Signature(s) Signed: 03/30/2021 5:39:20 PM By: Levan Hurst RN, BSN Entered By: Levan Hurst on 03/30/2021 09:03:39 -------------------------------------------------------------------------------- Wound Assessment Details Patient Name: Date of Service: [name], [name] 03/30/2021 8:00 [name] Medical Record Number: [id number] Patient Account Number: [id number] Date of Birth/Sex: Treating RN: 1986-04-10 (35 y.o. [name] Primary Care Marvens Hollars: [name], [name] Other Clinician: Referring Huriel Matt: Treating Zia Najera/Extender: [name]: 273 Wound Status Wound Number: 41R Primary Etiology: Pressure Ulcer Wound Location: Left Ischium Wound Status: Open Wounding Event: Gradually Appeared Comorbid History: Sleep Apnea, Hypertension, Paraplegia Date Acquired: 03/16/2020 Weeks Of Treatment: 54 Clustered Wound: Yes Photos Wound Measurements Length: (cm) 2.5 Width: (cm) 2.5 Depth: (cm) 0.1 Clustered Quantity: 2 Area: (cm) 4.909 Volume: (cm) 0.491 % Reduction in Area: -171.8% % Reduction in Volume: -171.3% Epithelialization: Small (1-33%) Tunneling: No Undermining: No Wound Description Classification: Category/Stage II Wound Margin: Distinct, outline attached Exudate  Amount: Medium Exudate Type: Serosanguineous Exudate Color: red, brown Foul Odor After Cleansing: No Slough/Fibrino No Wound Bed Granulation Amount: Large (67-100%) Exposed Structure Granulation Quality: Red, Pink, Friable Fascia Exposed: No Necrotic Amount: None Present (0%) Fat Layer (Subcutaneous Tissue) Exposed: Yes Tendon Exposed: No Muscle Exposed: No Joint Exposed: No Bone Exposed: No Treatment Notes Wound #41R (Ischium) Wound Laterality: Left Cleanser Soap and Water Discharge Instruction: May shower and wash wound with dial antibacterial soap and water prior to dressing change. Peri-Wound Care Topical Primary Dressing Hydrofera Blue Ready Foam, 4x5 in Discharge Instruction: Apply to wound bed as instructed Secondary Dressing ComfortFoam Border, 4x4 in (silicone border) Discharge Instruction: Apply over primary dressing as directed. Secured With Compression Wrap Compression Stockings Environmental education officer) Signed: 03/31/2021 5:40:35 PM By: Lorrin Jackson Signed: 04/01/2021 9:46:23 AM By: Sandre Kitty Previous Signature: 03/30/2021 5:46:23 PM Version By: Lorrin Jackson Entered By: Sandre Kitty on 03/31/2021 12:08:55 -------------------------------------------------------------------------------- Wound Assessment Details Patient Name: Date of Service: [name], [name] 03/30/2021 8:00 [name] Medical Record Number: [id number] Patient Account Number: [id number] Date of Birth/Sex: Treating RN: 1986/05/18 (35 y.o. [name] Primary Care Kearstyn Avitia: [place], [place] Other Clinician: Referring Sharian Delia: Treating Aizza Santiago/Extender: [name]: 273 Wound Status Wound Number: 50 Primary Etiology: Venous Leg Ulcer Wound Location: Right, Dorsal Ankle Wound Status: Open Wounding Event: Gradually Appeared Comorbid History: Sleep Apnea, Hypertension, Paraplegia Date Acquired: 01/12/2021 Weeks Of Treatment: 11 Clustered  Wound: No Photos Wound Measurements Length: (cm) 0.5 Width: (cm) 0.5 Depth: (cm) 0.1 Area: (cm) 0.196 Volume: (cm) 0.02 % Reduction in Area: 89.6% % Reduction in Volume: 94.7% Epithelialization: Medium (34-66%) Tunneling: No Undermining: No Wound Description Classification: Full Thickness Without Exposed Support Structures Wound Margin: Distinct, outline attached Exudate Amount: Medium Exudate Type: Serosanguineous Exudate Color:  red, brown Foul Odor After Cleansing: No Slough/Fibrino No Wound Bed Granulation Amount: Large (67-100%) Exposed Structure Granulation Quality: Red, Friable Fascia Exposed: No Necrotic Amount: None Present (0%) Fat Layer (Subcutaneous Tissue) Exposed: Yes Tendon Exposed: No Muscle Exposed: No Joint Exposed: No Bone Exposed: No Treatment Notes Wound #50 (Ankle) Wound Laterality: Dorsal, Right Cleanser Wound Cleanser Discharge Instruction: Cleanse the wound with wound cleanser prior to applying [name] clean dressing using gauze sponges, not tissue or cotton balls. Soap and Water Discharge Instruction: May shower and wash wound with dial antibacterial soap and water prior to dressing change. Peri-Wound Care Topical Primary Dressing Hydrofera Blue Ready Foam, 2.5 x2.5 in Discharge Instruction: Apply to wound bed as instructed Secondary Dressing ComfortFoam Border, 4x4 in (silicone border) Discharge Instruction: Apply over primary dressing as directed. Secured With Compression Wrap Compression Stockings Environmental education officer) Signed: 03/31/2021 5:40:35 PM By: Lorrin Jackson Signed: 04/01/2021 9:46:23 AM By: Sandre Kitty Previous Signature: 03/30/2021 5:46:23 PM Version By: Lorrin Jackson Entered By: Sandre Kitty on 03/31/2021 12:10:53 -------------------------------------------------------------------------------- Wound Assessment Details Patient Name: Date of Service: [name], [name] 03/30/2021 8:00 [name] Medical Record Number:  [id number] Patient Account Number: [id number] Date of Birth/Sex: Treating RN: [birth year] (35 y.o. [name] Primary Care Iyana Topor: [name], [name] Other Clinician: Referring Edeline Greening: Treating [name] Sperl/Extender: [name]: 273 Wound Status Wound Number: 51 Primary Etiology: Trauma, Other Wound Location: Left, Anterior Lower Leg Wound Status: Open Wounding Event: Trauma Comorbid History: Sleep Apnea, Hypertension, Paraplegia Date Acquired: 01/26/2021 Weeks Of Treatment: 6 Clustered Wound: No Photos Wound Measurements Length: (cm) 0.7 Width: (cm) 0.6 Depth: (cm) 0.1 Area: (cm) 0.33 Volume: (cm) 0.033 % Reduction in Area: 0% % Reduction in Volume: 0% Epithelialization: Small (1-33%) Tunneling: No Undermining: No Wound Description Classification: Full Thickness Without Exposed Support Structures Wound Margin: Well defined, not attached Exudate Amount: Medium Exudate Type: Serosanguineous Exudate Color: red, brown Foul Odor After Cleansing: No Slough/Fibrino No Wound Bed Granulation Amount: Large (67-100%) Exposed Structure Granulation Quality: Red, Friable Fascia Exposed: No Necrotic Amount: None Present (0%) Fat Layer (Subcutaneous Tissue) Exposed: Yes Tendon Exposed: No Muscle Exposed: No Joint Exposed: No Bone Exposed: No Treatment Notes Wound #51 (Lower Leg) Wound Laterality: Left, Anterior Cleanser Wound Cleanser Discharge Instruction: Cleanse the wound with wound cleanser prior to applying [name] clean dressing using gauze sponges, not tissue or cotton balls. Soap and Water Discharge Instruction: May shower and wash wound with dial antibacterial soap and water prior to dressing change. Peri-Wound Care Topical Primary Dressing Hydrofera Blue Ready Foam, 2.5 x2.5 in Discharge Instruction: Apply to wound bed as instructed Secondary Dressing ComfortFoam Border, 4x4 in (silicone border) Discharge Instruction: Apply  over primary dressing as directed. Secured With Compression Wrap Compression Stockings Environmental education officer) Signed: 03/31/2021 5:40:35 PM By: Lorrin Jackson Signed: 04/01/2021 9:46:23 AM By: Sandre Kitty Signed: 04/01/2021 9:46:23 AM By: Sandre Kitty Previous Signature: 03/30/2021 5:46:23 PM Version By: Lorrin Jackson Entered By: Sandre Kitty on 03/31/2021 12:10:01 -------------------------------------------------------------------------------- Wound Assessment Details Patient Name: Date of Service: [name], [name] 03/30/2021 8:00 [name] Medical Record Number: [id number] Patient Account Number: [id number] Date of Birth/Sex: Treating RN: Jan 27, 1986 (35 y.o. [name] Primary Care Delorean Knutzen: [place], [place] Other Clinician: Referring Pearlie Lafosse: Treating Santiaga Butzin/Extender: [name]: 273 Wound Status Wound Number: 52 Primary Etiology: Trauma, Other Wound Location: Right, Dorsal Foot Wound Status: Open Wounding Event: Gradually Appeared Comorbid History: Sleep Apnea, Hypertension, Paraplegia Date Acquired: 03/27/2021 Weeks Of Treatment:  0 Clustered Wound: No Photos Wound Measurements Length: (cm) 0.6 Width: (cm) 4 Depth: (cm) 0.1 Area: (cm) 1.885 Volume: (cm) 0.188 % Reduction in Area: 0% % Reduction in Volume: 0% Epithelialization: None Tunneling: No Undermining: No Wound Description Classification: Full Thickness Without Exposed Support Structures Wound Margin: Distinct, outline attached Exudate Amount: Medium Exudate Type: Sanguinous Exudate Color: red Foul Odor After Cleansing: No Slough/Fibrino No Wound Bed Granulation Amount: Large (67-100%) Exposed Structure Granulation Quality: Red, Friable Fascia Exposed: No Necrotic Amount: None Present (0%) Fat Layer (Subcutaneous Tissue) Exposed: Yes Tendon Exposed: No Muscle Exposed: No Joint Exposed: No Bone Exposed: No Treatment Notes Wound #52 (Foot)  Wound Laterality: Dorsal, Right Cleanser Wound Cleanser Discharge Instruction: Cleanse the wound with wound cleanser prior to applying [name] clean dressing using gauze sponges, not tissue or cotton balls. Soap and Water Discharge Instruction: May shower and wash wound with dial antibacterial soap and water prior to dressing change. Peri-Wound Care Topical Primary Dressing Hydrofera Blue Ready Foam, 2.5 x2.5 in Discharge Instruction: Apply to wound bed as instructed Secondary Dressing Woven Gauze Sponge, Non-Sterile 4x4 in Discharge Instruction: Apply over primary dressing as directed. Secured With [employer], 4.5x3.1 (in/yd) Discharge Instruction: Secure with Kerlix as directed. 76M Medipore H Soft Cloth Surgical T ape, 2x2 (in/yd) Discharge Instruction: Secure dressing with tape as directed. Compression Wrap Compression Stockings Add-Ons Electronic Signature(s) Signed: 03/31/2021 5:40:35 PM By: Lorrin Jackson Signed: 04/01/2021 9:46:23 AM By: Sandre Kitty Previous Signature: 03/30/2021 5:46:23 PM Version By: Lorrin Jackson Entered By: Sandre Kitty on 03/31/2021 12:11:52 -------------------------------------------------------------------------------- Vitals Details Patient Name: Date of Service: Bangert, [name] 03/30/2021 8:00 [name] Medical Record Number: [id number] Patient Account Number: [id number] Date of Birth/Sex: Treating RN: 1986-08-20 (35 y.o. [name] Primary Care Kaci Freel: [place], [place] Other Clinician: Referring Hye Trawick: Treating Demarques Pilz/Extender: [name]: 273 Vital Signs Time Taken: 08:10 Temperature (F): 97.7 Height (in): 70 Pulse (bpm): 130 Weight (lbs): 216 Respiratory Rate (breaths/min): 18 Body Mass Index (BMI): 31 Blood Pressure (mmHg): 120/80 Reference Range: 80 - 120 mg / dl Electronic Signature(s) Signed: 03/30/2021 5:46:23 PM By: Lorrin Jackson Entered By: Lorrin Jackson on 03/30/2021  08:12:17

## 2021-04-13 ENCOUNTER — Encounter (HOSPITAL_BASED_OUTPATIENT_CLINIC_OR_DEPARTMENT_OTHER): Payer: 59 | Admitting: Internal Medicine

## 2021-04-13 ENCOUNTER — Other Ambulatory Visit: Payer: Self-pay

## 2021-04-13 DIAGNOSIS — I87332 Chronic venous hypertension (idiopathic) with ulcer and inflammation of left lower extremity: Secondary | ICD-10-CM | POA: Diagnosis not present

## 2021-04-13 DIAGNOSIS — L97821 Non-pressure chronic ulcer of other part of left lower leg limited to breakdown of skin: Secondary | ICD-10-CM | POA: Diagnosis not present

## 2021-04-13 DIAGNOSIS — L89323 Pressure ulcer of left buttock, stage 3: Secondary | ICD-10-CM | POA: Diagnosis not present

## 2021-04-13 NOTE — Progress Notes (Signed)
[name], [name] ([id number]) Visit Report for 04/13/2021 Arrival Information Details Patient Name: Date of Service: [name], [name] 04/13/2021 8:00 [name] Medical Record Number: [id number] Patient Account Number: [id number] Date of Birth/Sex: Treating RN: [birth year] (35 y.o. [name] Primary Care [name]: [name] Other Clinician: Referring Harm Jou: Treating Mahek Schlesinger/Extender: Carmell Austria [name]: 275 Visit Information History Since Last Visit Added or deleted any medications: No Patient Arrived: Wheel Chair Any new allergies or adverse reactions: No Arrival Time: 08:15 Had [name] fall or experienced change in No Accompanied By: self activities of daily living that may affect Transfer Assistance: Manual risk of falls: Patient Identification Verified: Yes Signs or symptoms of abuse/neglect since last visito No Secondary Verification Process Completed: Yes Hospitalized since last visit: No Patient Requires Transmission-Based Precautions: No Implantable device outside of the clinic excluding No Patient Has Alerts: Yes cellular tissue based products placed in the center Patient Alerts: R ABI = 1.0 since last visit: L ABI = 1.1 Has Dressing in Place as Prescribed: Yes Pain Present Now: No Electronic Signature(s) Signed: 04/13/2021 3:41:13 PM By: Sandre Kitty Entered By: Sandre Kitty on 04/13/2021 08:17:06 -------------------------------------------------------------------------------- Encounter Discharge Information Details Patient Name: Date of Service: [name], [name] 04/13/2021 8:00 [name] Medical Record Number: [id number] Patient Account Number: [id number] Date of Birth/Sex: Treating RN: 11/27/1985 (35 y.o. [name] Primary Care Rosalie [name]: [name] Other Clinician: Referring Yannis Gumbs: Treating Rosemae Mcquown/Extender: Carmell Austria [name]: 33 Encounter Discharge Information Items Post Procedure  Vitals Discharge Condition: Stable Temperature (F): 98 Ambulatory Status: Wheelchair Pulse (bpm): 130 Discharge Destination: Home Respiratory Rate (breaths/min): 18 Transportation: Private Auto Blood Pressure (mmHg): 121/73 Schedule Follow-up Appointment: Yes Clinical Summary of Care: Provided on 04/13/2021 Form Type Recipient Paper Patient Patient Electronic Signature(s) Signed: 04/13/2021 9:26:29 AM By: Lorrin Jackson Previous Signature: 04/13/2021 9:12:52 AM Version By: Lorrin Jackson Entered By: Lorrin Jackson on 04/13/2021 09:26:29 -------------------------------------------------------------------------------- Lower Extremity Assessment Details Patient Name: Date of Service: [name], [name] 04/13/2021 8:00 [name] Medical Record Number: [id number] Patient Account Number: [id number] Date of Birth/Sex: Treating RN: May 29, 1986 (35 y.o. [name] Primary Care Cheron Pasquarelli: Elida, [name] Other Clinician: Referring [name]: Treating Ercel Pepitone/Extender: Nena Alexander, [name] [name]: 275 Edema Assessment Assessed: [Left: Yes] [Right: Yes] Edema: [Left: Yes] [Right: Yes] Calf Left: Right: Point of Measurement: 33 cm From Medial Instep 27 cm 32.5 cm Ankle Left: Right: Point of Measurement: 10 cm From Medial Instep 22.5 cm 23 cm Vascular Assessment Pulses: Dorsalis Pedis Palpable: [Left:Yes] [Right:Yes] Posterior Tibial Palpable: [Left:Yes] [Right:Yes] Electronic Signature(s) Signed: 04/13/2021 5:30:27 PM By: Rhae Hammock RN Entered By: Rhae Hammock on 04/13/2021 08:49:21 -------------------------------------------------------------------------------- Multi Wound Chart Details Patient Name: Date of Service: [name], [name] 04/13/2021 8:00 [name] Medical Record Number: [id number] Patient Account Number: [id number] Date of Birth/Sex: Treating RN: [birth year] (35 y.o. [name] Primary Care [name]: [name] Other  Clinician: Referring Bethanie Bloxom: Treating Niyana Chesbro/Extender: Nena Alexander, [name] [name]: 275 Vital Signs Height(in): 70 Pulse(bpm): 130 Weight(lbs): 216 Blood Pressure(mmHg): 121/73 Body Mass Index(BMI): 31 Temperature(F): 98.0 Respiratory Rate(breaths/min): 18 Photos: [41R:No Photos Left Ischium] [50:No Photos Right, Dorsal Ankle] [51:No Photos Left, Anterior Lower Leg] Wound Location: [41R:Gradually Appeared] [50:Gradually Appeared] [51:Trauma] Wounding Event: [41R:Pressure Ulcer] [50:Venous Leg Ulcer] [51:Trauma, Other] Primary Etiology: [41R:03/16/2020] [50:01/12/2021] [51:01/26/2021] Date Acquired: [41R:56] [50:13] [51:8] Weeks of Treatment: [41R:Open] [50:Open] [51:Open] Wound Status: [41R:Yes] [50:No] [51:No] Wound Recurrence: [41R:Yes] [50:No] [51:No] Clustered Wound: [41R:4.5x3x0.2] [50:0.1x0.1x0.1] [51:1.4x0.9x0.1]  Measurements L x W x D (cm) [41R:10.603] [50:0.008] [51:0.99] [name] (cm) : rea [41R:2.121] [50:0.001] [51:0.099] Volume (cm) : [41R:-487.10%] [50:99.60%] [51:-200.00%] % Reduction in Area: [41R:-1071.80%] [50:99.70%] [51:-200.00%] % Reduction in Volume: [41R:Category/Stage II] [50:Full Thickness Without Exposed] [51:Full Thickness Without Exposed] Classification: [41R:Debridement - Excisional] [50:Support Structures [name]] [51:Support Structures Debridement - Excisional] Debridement: [41R:09:10] [50:[name]] [51:09:10] Pre-procedure Verification/Time Out Taken: [41R:Subcutaneous] [50:[name]] [51:Subcutaneous] Tissue Debrided: [41R:Skin/Subcutaneous Tissue] [50:[name]] [51:Skin/Subcutaneous Tissue] Level: [41R:1] [50:[name]] [51:1.26] Debridement [name] (sq cm): [41R:rea Curette] [50:[name]] [51:Curette] Instrument: [41R:Minimum] [50:[name]] [51:Minimum] Bleeding: [41R:Pressure] [50:[name]] [51:Pressure] Hemostasis [name] chieved: [41R:0] [50:[name]] [51:0] Procedural Pain: [41R:0] [50:[name]] [51:0] Post Procedural Pain: [41R:Procedure was tolerated well] [50:[name]] [51:Procedure  was tolerated well] Debridement Treatment Response: [41R:4.5x3x0.2] [50:[name]] [51:1.4x0.9x0.1] Post Debridement Measurements L x W x D (cm) [41R:2.121] [50:[name]] [51:0.099] Post Debridement Volume: (cm) [41R:Category/Stage II] [50:[name]] [51:[name]] Post Debridement Stage: [41R:Debridement] [50:[name]] [51:Debridement] Wound Number: 52 [name] [name] Photos: No Photos [name] [name] Right, Dorsal Foot [name] [name] Wound Location: Gradually Appeared [name] [name] Wounding Event: Trauma, Other [name] [name] Primary Etiology: 03/27/2021 [name] [name] Date Acquired: 2 [name] [name] Weeks of Treatment: Open [name] [name] Wound Status: No [name] [name] Wound Recurrence: No [name] [name] Clustered Wound: 0.2x0.2x0.1 [name] [name] Measurements L x W x D (cm) 0.031 [name] [name] [name] (cm) : rea 0.003 [name] [name] Volume (cm) : 98.40% [name] [name] % Reduction in [name] rea: 98.40% [name] [name] % Reduction in Volume: Full Thickness Without Exposed [name] [name] Classification: Support Structures [name] [name] [name] Debridement: [name] [name] [name] Tissue Debrided: [name] [name] [name] Level: [name] [name] [name] Debridement [name] (sq cm): rea [name] [name] [name] Instrument: [name] [name] [name] Bleeding: [name] [name] [name] Hemostasis [name] chieved: [name] [name] [name] Procedural Pain: [name] [name] [name] Post Procedural Pain: Debridement Treatment Response: [name] [name] [name] Post Debridement Measurements L x [name] [name] [name] W x D (cm) [name] [name] [name] Post Debridement Volume: (cm) [name] [name] [name] Post Debridement Stage: [name] [name] [name] Procedures Performed: Treatment Notes Wound #41R (Ischium) Wound Laterality: Left Cleanser Soap and Water Discharge Instruction: May shower and wash wound with dial antibacterial soap and water prior to dressing change. Peri-Wound Care Topical Primary Dressing Hydrofera Blue Ready Foam, 4x5 in Discharge Instruction: Apply to wound bed as instructed Secondary Dressing ComfortFoam Border, 4x4 in (silicone border) Discharge Instruction: Apply over primary dressing as directed. Secured With Compression Wrap Compression  Stockings Add-Ons Wound #50 (Ankle) Wound Laterality: Dorsal, Right Cleanser Wound Cleanser Discharge Instruction: Cleanse the wound with wound cleanser prior to applying [name] clean dressing using gauze sponges, not tissue or cotton balls. Soap and Water Discharge Instruction: May shower and wash wound with dial antibacterial soap and water prior to dressing change. Peri-Wound Care Topical Primary Dressing Hydrofera Blue Ready Foam, 2.5 x2.5 in Discharge Instruction: Apply to wound bed as instructed Secondary Dressing ComfortFoam Border, 4x4 in (silicone border) Discharge Instruction: Apply over primary dressing as directed. Secured With Compression Wrap Compression Stockings Add-Ons Wound #51 (Lower Leg) Wound Laterality: Left, Anterior Cleanser Wound Cleanser Discharge Instruction: Cleanse the wound with wound cleanser prior to applying [name] clean dressing using gauze sponges, not tissue or cotton balls. Soap and Water Discharge Instruction: May shower and wash wound with dial antibacterial soap and water prior to dressing change. Peri-Wound Care Topical Primary Dressing Hydrofera Blue Ready Foam, 2.5 x2.5 in Discharge Instruction: Apply to wound bed as instructed Secondary Dressing ComfortFoam Border, 4x4 in (silicone border) Discharge Instruction: Apply over primary dressing as directed. Secured With Compression Wrap Compression Stockings Add-Ons Wound #52 (Foot)  Wound Laterality: Dorsal, Right Cleanser Wound Cleanser Discharge Instruction: Cleanse the wound with wound cleanser prior to applying [name] clean dressing using gauze sponges, not tissue or cotton balls. Soap and Water Discharge Instruction: May shower and wash wound with dial antibacterial soap and water prior to dressing change. Peri-Wound Care Topical Primary Dressing Hydrofera Blue Ready Foam, 2.5 x2.5 in Discharge Instruction: Apply to wound bed as instructed Secondary Dressing Woven Gauze Sponge,  Non-Sterile 4x4 in Discharge Instruction: Apply over primary dressing as directed. Secured With [employer], 4.5x3.1 (in/yd) Discharge Instruction: Secure with Kerlix as directed. 62M Medipore H Soft Cloth Surgical T ape, 2x2 (in/yd) Discharge Instruction: Secure dressing with tape as directed. Compression Wrap Compression Stockings Add-Ons Electronic Signature(s) Signed: 04/13/2021 9:23:53 AM By: Kalman Shan DO Signed: 04/13/2021 5:27:53 PM By: Levan Hurst RN, BSN Entered By: Kalman Shan on 04/13/2021 09:18:56 -------------------------------------------------------------------------------- Multi-Disciplinary Care Plan Details Patient Name: Date of Service: [name], [name] 04/13/2021 8:00 [name] Medical Record Number: [id number] Patient Account Number: [id number] Date of Birth/Sex: Treating RN: February 12, 1986 (35 y.o. [name] Primary Care Posey Jasmin: [name] Other Clinician: Referring Cayleb Jarnigan: Treating Zyren Sevigny/Extender: Carmell Austria [name]: [place] reviewed with physician Active Inactive Wound/Skin Impairment Nursing Diagnoses: Impaired tissue integrity Knowledge deficit related to ulceration/compromised skin integrity Goals: Patient/caregiver will verbalize understanding of skin care regimen Date Initiated: 01/05/2016 Target Resolution Date: 05/08/2021 Goal Status: Active Ulcer/skin breakdown will have [name] volume reduction of 30% by week 4 Date Initiated: 01/05/2016 Date Inactivated: 12/22/2017 Target Resolution Date: 01/19/2018 Unmet Reason: complex wounds, Goal Status: Unmet infection Interventions: Assess patient/caregiver ability to obtain necessary supplies Assess ulceration(s) every visit Provide education on ulcer and skin care Notes: 02/02/21: Complex Care, ongoing. Electronic Signature(s) Signed: 04/13/2021 5:27:53 PM By: Levan Hurst RN, BSN Entered By: Levan Hurst on 04/13/2021  09:17:44 -------------------------------------------------------------------------------- Pain Assessment Details Patient Name: Date of Service: [name], [name] 04/13/2021 8:00 [name] Medical Record Number: [id number] Patient Account Number: [id number] Date of Birth/Sex: Treating RN: [birth year] (35 y.o. [name] Primary Care Decklin Weddington: [place], [place] Other Clinician: Referring Salle Brandle: Treating Jeriah Skufca/Extender: Nena Alexander, [name] [name]: 275 Active Problems Location of Pain Severity and Description of Pain Patient Has Paino No Site Locations Pain Management and Medication Current Pain Management: Electronic Signature(s) Signed: 04/13/2021 3:41:13 PM By: Sandre Kitty Signed: 04/13/2021 5:27:53 PM By: Levan Hurst RN, BSN Entered By: Sandre Kitty on 04/13/2021 08:17:32 -------------------------------------------------------------------------------- Patient/Caregiver Education Details Patient Name: Date of Service: [name], [name] Viviann Spare 5/23/2022andnbsp8:00 [name] Medical Record Number: [id number] Patient Account Number: [id number] Date of Birth/Gender: Treating RN: May 15, 1986 (35 y.o. [name] Primary Care Physician: Janine Limbo Other Clinician: Referring Physician: Treating Physician/Extender: Carmell Austria [name]: 2 Education Assessment Education Provided To: Patient Education Topics Provided Wound/Skin Impairment: Methods: Explain/Verbal Responses: State content correctly Electronic Signature(s) Signed: 04/13/2021 5:27:53 PM By: Levan Hurst RN, BSN Entered By: Levan Hurst on 04/13/2021 09:20:15 -------------------------------------------------------------------------------- Wound Assessment Details Patient Name: Date of Service: [name], [name] 04/13/2021 8:00 [name] Medical Record Number: [id number] Patient Account Number: [id number] Date of Birth/Sex: Treating RN: [birth year] (35 y.o. [name] Primary Care Martina Brodbeck: [place], [place] Other Clinician: Referring Forney Kleinpeter: Treating Jerrika Ledlow/Extender: Nena Alexander, [name] [name]: 275 Wound Status Wound Number: [id number] Primary Etiology: Pressure Ulcer Wound Location: Left Ischium Wound Status: Open Wounding Event: Gradually Appeared Comorbid History: Sleep Apnea, Hypertension, Paraplegia Date Acquired: 03/16/2020 Weeks Of Treatment: 65  Clustered Wound: Yes Photos Wound Measurements Length: (cm) 4.5 Width: (cm) 3 Depth: (cm) 0.2 Clustered Quantity: 2 Area: (cm) 10.603 Volume: (cm) 2.121 % Reduction in Area: -487.1% % Reduction in Volume: -1071.8% Epithelialization: Small (1-33%) Wound Description Classification: Category/Stage II Wound Margin: Distinct, outline attached Exudate Amount: Medium Exudate Type: Serosanguineous Exudate Color: red, brown Foul Odor After Cleansing: No Slough/Fibrino No Wound Bed Granulation Amount: Large (67-100%) Exposed Structure Granulation Quality: Red, Pink, Friable Fascia Exposed: No Necrotic Amount: None Present (0%) Fat Layer (Subcutaneous Tissue) Exposed: Yes Tendon Exposed: No Muscle Exposed: No Joint Exposed: No Bone Exposed: No Treatment Notes Wound #41R (Ischium) Wound Laterality: Left Cleanser Soap and Water Discharge Instruction: May shower and wash wound with dial antibacterial soap and water prior to dressing change. Peri-Wound Care Topical Primary Dressing Hydrofera Blue Ready Foam, 4x5 in Discharge Instruction: Apply to wound bed as instructed Secondary Dressing ComfortFoam Border, 4x4 in (silicone border) Discharge Instruction: Apply over primary dressing as directed. Secured With Compression Wrap Compression Stockings Environmental education officer) Signed: 04/13/2021 5:06:59 PM By: Sandre Kitty Signed: 04/13/2021 5:27:53 PM By: Levan Hurst RN, BSN Previous Signature: 04/13/2021 3:41:13 PM Version By: Sandre Kitty Entered By: Sandre Kitty on 04/13/2021 16:30:38 -------------------------------------------------------------------------------- Wound Assessment Details Patient Name: Date of Service: [name], [name] 04/13/2021 8:00 [name] Medical Record Number: [id number] Patient Account Number: [id number] Date of Birth/Sex: Treating RN: [birth year] (35 y.o. [name] Primary Care Norine Reddington: [name] Other Clinician: Referring Pamelia Botto: Treating Kennie Snedden/Extender: Nena Alexander, [name] [name]: 275 Wound Status Wound Number: 50 Primary Etiology: Venous Leg Ulcer Wound Location: Right, Dorsal Ankle Wound Status: Open Wounding Event: Gradually Appeared Comorbid History: Sleep Apnea, Hypertension, Paraplegia Date Acquired: 01/12/2021 Weeks Of Treatment: 13 Clustered Wound: No Photos Wound Measurements Length: (cm) 0.1 Width: (cm) 0.1 Depth: (cm) 0.1 Area: (cm) 0.008 Volume: (cm) 0.001 Wound Description Classification: Full Thickness Without Exposed Support Struct Wound Margin: Distinct, outline attached Exudate Amount: Medium Exudate Type: Serosanguineous Exudate Color: red, brown Foul Odor After Cleansing: Slough/Fibrino % Reduction in Area: 99.6% % Reduction in Volume: 99.7% Epithelialization: Medium (34-66%) ures No No Wound Bed Granulation Amount: Large (67-100%) Exposed Structure Granulation Quality: Red, Friable Fascia Exposed: No Necrotic Amount: None Present (0%) Fat Layer (Subcutaneous Tissue) Exposed: Yes Tendon Exposed: No Muscle Exposed: No Joint Exposed: No Bone Exposed: No Treatment Notes Wound #50 (Ankle) Wound Laterality: Dorsal, Right Cleanser Wound Cleanser Discharge Instruction: Cleanse the wound with wound cleanser prior to applying [name] clean dressing using gauze sponges, not tissue or cotton balls. Soap and Water Discharge Instruction: May shower and wash wound with dial antibacterial soap and water prior to  dressing change. Peri-Wound Care Topical Primary Dressing Hydrofera Blue Ready Foam, 2.5 x2.5 in Discharge Instruction: Apply to wound bed as instructed Secondary Dressing ComfortFoam Border, 4x4 in (silicone border) Discharge Instruction: Apply over primary dressing as directed. Secured With Compression Wrap Compression Stockings Environmental education officer) Signed: 04/13/2021 5:06:59 PM By: Sandre Kitty Signed: 04/13/2021 5:27:53 PM By: Levan Hurst RN, BSN Previous Signature: 04/13/2021 3:41:13 PM Version By: Sandre Kitty Entered By: Sandre Kitty on 04/13/2021 16:24:29 -------------------------------------------------------------------------------- Wound Assessment Details Patient Name: Date of Service: [name], [name] 04/13/2021 8:00 [name] Medical Record Number: [id number] Patient Account Number: [id number] Date of Birth/Sex: Treating RN: September 18, 1986 (35 y.o. [name] Primary Care Dannon Perlow: [name] Other Clinician: Referring Darcus Edds: Treating Nannette Zill/Extender: Nena Alexander, [name] [name]: 275 Wound Status Wound Number: 51 Primary Etiology: Trauma, Other Wound  Location: Left, Anterior Lower Leg Wound Status: Open Wounding Event: Trauma Comorbid History: Sleep Apnea, Hypertension, Paraplegia Date Acquired: 01/26/2021 Weeks Of Treatment: 8 Clustered Wound: No Photos Wound Measurements Length: (cm) 1.4 Width: (cm) 0.9 Depth: (cm) 0.1 Area: (cm) 0.99 Volume: (cm) 0.099 % Reduction in Area: -200% % Reduction in Volume: -200% Epithelialization: Small (1-33%) Wound Description Classification: Full Thickness Without Exposed Support Structures Wound Margin: Well defined, not attached Exudate Amount: Medium Exudate Type: Serosanguineous Exudate Color: red, brown Foul Odor After Cleansing: No Slough/Fibrino No Wound Bed Granulation Amount: Large (67-100%) Exposed Structure Granulation Quality: Red, Friable Fascia  Exposed: No Necrotic Amount: None Present (0%) Fat Layer (Subcutaneous Tissue) Exposed: Yes Tendon Exposed: No Muscle Exposed: No Joint Exposed: No Bone Exposed: No Treatment Notes Wound #51 (Lower Leg) Wound Laterality: Left, Anterior Cleanser Wound Cleanser Discharge Instruction: Cleanse the wound with wound cleanser prior to applying [name] clean dressing using gauze sponges, not tissue or cotton balls. Soap and Water Discharge Instruction: May shower and wash wound with dial antibacterial soap and water prior to dressing change. Peri-Wound Care Topical Primary Dressing Hydrofera Blue Ready Foam, 2.5 x2.5 in Discharge Instruction: Apply to wound bed as instructed Secondary Dressing ComfortFoam Border, 4x4 in (silicone border) Discharge Instruction: Apply over primary dressing as directed. Secured With Compression Wrap Compression Stockings Environmental education officer) Signed: 04/13/2021 5:06:59 PM By: Sandre Kitty Signed: 04/13/2021 5:27:53 PM By: Levan Hurst RN, BSN Previous Signature: 04/13/2021 3:41:13 PM Version By: Sandre Kitty Entered By: Sandre Kitty on 04/13/2021 16:21:59 -------------------------------------------------------------------------------- Wound Assessment Details Patient Name: Date of Service: [name], [name] 04/13/2021 8:00 [name] Medical Record Number: [id number] Patient Account Number: [id number] Date of Birth/Sex: Treating RN: Mar 11, 1986 (35 y.o. [name] Primary Care Tayshaun Kroh: [name] Other Clinician: Referring Viola Kinnick: Treating Ronalee Scheunemann/Extender: Nena Alexander, [name] [name]: 275 Wound Status Wound Number: 52 Primary Etiology: Trauma, Other Wound Location: Right, Dorsal Foot Wound Status: Open Wounding Event: Gradually Appeared Comorbid History: Sleep Apnea, Hypertension, Paraplegia Date Acquired: 03/27/2021 Weeks Of Treatment: 2 Clustered Wound: No Photos Wound Measurements Length: (cm)  0.2 Width: (cm) 0.2 Depth: (cm) 0.1 Area: (cm) 0.031 Volume: (cm) 0.003 % Reduction in Area: 98.4% % Reduction in Volume: 98.4% Epithelialization: None Wound Description Classification: Full Thickness Without Exposed Support Structures Wound Margin: Distinct, outline attached Exudate Amount: Medium Exudate Type: Sanguinous Exudate Color: red Foul Odor After Cleansing: No Slough/Fibrino No Wound Bed Granulation Amount: Large (67-100%) Exposed Structure Granulation Quality: Red, Friable Fascia Exposed: No Necrotic Amount: None Present (0%) Fat Layer (Subcutaneous Tissue) Exposed: Yes Tendon Exposed: No Muscle Exposed: No Joint Exposed: No Bone Exposed: No Treatment Notes Wound #52 (Foot) Wound Laterality: Dorsal, Right Cleanser Wound Cleanser Discharge Instruction: Cleanse the wound with wound cleanser prior to applying [name] clean dressing using gauze sponges, not tissue or cotton balls. Soap and Water Discharge Instruction: May shower and wash wound with dial antibacterial soap and water prior to dressing change. Peri-Wound Care Topical Primary Dressing Hydrofera Blue Ready Foam, 2.5 x2.5 in Discharge Instruction: Apply to wound bed as instructed Secondary Dressing Woven Gauze Sponge, Non-Sterile 4x4 in Discharge Instruction: Apply over primary dressing as directed. Secured With [employer], 4.5x3.1 (in/yd) Discharge Instruction: Secure with Kerlix as directed. 60M Medipore H Soft Cloth Surgical T ape, 2x2 (in/yd) Discharge Instruction: Secure dressing with tape as directed. Compression Wrap Compression Stockings Add-Ons Electronic Signature(s) Signed: 04/13/2021 5:06:59 PM By: Sandre Kitty Signed: 04/13/2021 5:27:53 PM By: Levan Hurst RN, BSN Previous Signature: 04/13/2021  3:41:13 PM Version By: Sandre Kitty Entered By: Sandre Kitty on 04/13/2021 16:29:34 -------------------------------------------------------------------------------- [place]  Details Patient Name: Date of Service: Starr, [name] 04/13/2021 8:00 [name] Medical Record Number: [id number] Patient Account Number: [id number] Date of Birth/Sex: Treating RN: [birth year] (35 y.o. [name] Primary Care Kashaun Bebo: [place], [place] Other Clinician: Referring Kedarius Aloisi: Treating Lynette Topete/Extender: Nena Alexander, [name] [name]: 275 Vital Signs Time Taken: 08:17 Temperature (F): 98.0 Height (in): 70 Pulse (bpm): 130 Weight (lbs): 216 Respiratory Rate (breaths/min): 18 Body Mass Index (BMI): 31 Blood Pressure (mmHg): 121/73 Reference Range: 80 - 120 mg / dl Electronic Signature(s) Signed: 04/13/2021 3:41:13 PM By: Sandre Kitty Entered By: Sandre Kitty on 04/13/2021 08:17:24

## 2021-04-13 NOTE — Progress Notes (Signed)
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 venous ulceration. Careful inspection of the wound bed underlying rumination suggested some degree of epithelialization over the surface no debridement indicated. Continue Hydrofera Blue difficult areas between his toes first and third on the left than first and second on the right. I'm going to change to silver alginate from silver collagen. Continue ketoconazole as I suspect underlying tinea pedis 05/26/17; left lateral leg venous insufficiency wound. We've been using Hydrofera Blue. I believe that there is expanding epithelialization over the surface of the wound albeit not coming from the wound circumference. This is a bit of an odd situation in which the epithelialization seems to be coming from the surface of the wound rather than in the exact circumference. There is still small open areas mostly along the lateral margin of the wound. He has unchanged areas between the left first and second and the right first second toes which I been treating for tenia pedis 06/02/17; left lateral leg venous  insufficiency wound. We have been using Hydrofera Blue. Somewhat smaller from the wound circumference. The surface of the wound remains a bit on it almost epithelialized sedation in appearance. I use an open curette today debridement in the surface of all of this especially the edges Small open wounds remaining  on the dorsal right first and second toe interspace and the plantar left first second toe and her face on the left 06/09/17; wound on the left lateral leg continues to be smaller but very gradual and very dry surface using Hydrofera Blue 06/16/17 requires weekly debridements now on the left lateral leg although this continues to contract. I changed to silver collagen last week because of dryness of the wound bed. Using Iodoflex to the areas on his first and second toes/web space bilaterally 06/24/17; patient with history of paraplegia also chronic venous insufficiency with lymphedema. Has a very difficult wound on the left lateral leg. This has been gradually reducing in terms of with but comes in with a very dry adherent surface. High switch to silver collagen a week or so ago with hydrogel to keep the area moist. This is been refractory to multiple dressing attempts. He also has areas in his first and second toes bilaterally in the anterior and posterior web space. I had been using Iodoflex here after a prolonged course of silver alginate with ketoconazole was ineffective [question tinea pedis] 07/14/17; patient arrives today with a very difficult adherent material over his left lateral lower leg wound. He also has surrounding erythema and poorly controlled edema. He was switched his Santyl last visit which the nurses are applying once during his doctor visit and once on a nurse visit. He was also reduced to 2 layer compression I'm not exactly sure of the issue here. 07/21/17; better surface today after 1 week of Iodoflex. Significant cellulitis that we treated last week also better.  [Doxycycline] 07/28/17 better surface today with now 2 weeks of Iodoflex. Significant cellulitis treated with doxycycline. He has now completed the doxycycline and he is back to his usual degree of chronic venous inflammation/stasis dermatitis. He reminds me he has had ablations surgery here 08/04/17; continued improvement with Iodoflex to the left lateral leg wound in terms of the surface of the wound although the dimensions are better. 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 Problematic areas on the plantar aspect of the first second toe web space on the left and the dorsal aspect of the first second toe web space on the right. At one point I felt these were probably related to chronic fungal infections in treated him aggressively for this although we have not made any improvement here. 08/11/17; left lateral leg. Surface continues to improve with the Iodoflex although we are not seeing much improvement in overall wound dimensions. Areas on his plantar left foot and right foot show no improvement. In fact the right foot looks somewhat worse 08/18/17; left lateral leg. We changed to [facility] Blue last week after a prolonged course of Iodoflex which helps get the surface better. It appears that the wound with is improved. Continue with difficult areas on the left dorsal first second and plantar first second on the right 09/01/17; patient arrives in clinic today having had a temperature of 103 yesterday. He was seen in the ER and [facility]. The patient was concerned he could have cellulitis again in the right leg however they diagnosed him with a UTI and he is now on Keflex. He has a history of cellulitis which is been recurrent and difficult but this is been in the left leg, in the past 5 use doxycycline. He does in and out catheterizations at home which are risk factors for UTI 09/08/17; patient will be completing his Keflex this  weekend. The erythema on the left leg is considerably better. He has a new wound today on the medial part of the right leg small superficial almost looks like a skin tear. He has worsening of the area on the right dorsal first and second toe. His major area on the left lateral leg is better. Using Hydrofera Blue on all areas 09/15/17; gradual reduction in width on the long wound in the left lateral leg. No debridement required. He also has wounds on the plantar aspect of his left first second toe web space and on the dorsal aspect of the right first second toe web space. 09/22/17; there continues to be very gradual improvements in the dimensions of the left lateral leg wound. He hasn't round erythematous spot with might be pressure on his wheelchair. There is no evidence obviously of infection no purulence no warmth He has a dry scaled area on the plantar aspect of the left first second toe Improved area on the dorsal right first second toe. 09/29/17; left lateral leg wound continues to improve in dimensions mostly with an is still a fairly long but increasingly narrow wound. He has a dry scaled area on the plantar aspect of his left first second toe web space Increasingly concerning area on the dorsal right first second toe. In fact I am concerned today about possible cellulitis around this wound. The areas extending up his second toe and although there is deformities here almost appears to abut on the nailbed. 10/06/17; left lateral leg wound continues to make very gradual progress. Tissue culture I did from the right first second toe dorsal foot last time grew MRSA and enterococcus which was vancomycin sensitive. This was not sensitive to clindamycin or doxycycline. He is allergic to Zyvox and sulfa we have therefore arrange for him to have dalvance infusion tomorrow. He is had this in the past and tolerated it well 10/20/17; left lateral leg wound continues to make decent progress. This is  certainly reduced in terms of with there is advancing epithelialization.The cellulitis in the right foot looks better although he still has a deep wound in the dorsal aspect of the first second toe web space. Plantar left first toe web space on the left I think is making some progress 10/27/17; left lateral leg wound continues to make decent progress. Advancing epithelialization.using Hydrofera Blue The right first second toe web space wound is better-looking using silver alginate Improvement in the left plantar first second toe web space. Again using silver alginate 11/03/17 left lateral leg wound continues to make decent progress albeit slowly. Using [facility] The right per second toe web space continues to be a very problematic looking punched out wound. I obtained a piece of tissue for deep culture I did extensively treated this for fungus. It is difficult to imagine that this is a pressure area as the patient states other than going outside he doesn't really wear shoes at home The left plantar first second toe web space looked fairly senescent. Necrotic edges. This required debridement change to [facility] Blue to all wound areas 11/10/17; left lateral leg wound continues to contract. Using Hydrofera Blue On the right dorsal first second toe web space dorsally. Culture I did of this area last week grew MRSA there is not an easy oral option in this patient was multiple antibiotic allergies or intolerances. This was only a rare culture isolate I'm therefore going to use Bactroban under silver alginate On the left plantar first second toe web space. Debridement  is required here. This is also unchanged 11/17/17; left lateral leg wound continues to contract using Hydrofera Blue this is no longer the major issue. The major concern here is the right first second toe web space. He now has an open area going from dorsally to the plantar aspect. There is now wound on the inner lateral part of the  first toe. Not a very viable surface on this. There is erythema spreading medially into the forefoot. No major change in the left first second toe plantar wound 11/24/17; left lateral leg wound continues to contract using Hydrofera Blue. Nice improvement today The right first second toe web space all of this looks a lot less angry than last week. I have given him clindamycin and topical Bactroban for MRSA and terbinafine for the possibility of underlining tinea pedis that I could not control with ketoconazole. Looks somewhat better The area on the plantar left first second toe web space is weeping with dried debris around the wound 12/01/17; left lateral leg wound continues to contract he Hydrofera Blue. It is becoming thinner in terms of with nevertheless it is making good improvement. The right first second toe web space looks less angry but still a large necrotic-looking wounds starting on the plantar aspect of the right foot extending between the toes and now extensively on the base of the right second toe. I gave him clindamycin and topical Bactroban for MRSA anterior benefiting for the possibility of underlying tinea pedis. Not looking better today The area on the left first/second toe looks better. Debrided of necrotic debris 12/05/17* the patient was worked in urgently today because over the weekend he found blood on his incontinence bad when he woke up. He was found to have an ulcer by his wife who does most of his wound care. He came in today for [place] to look at this. He has not had a history of wounds in his buttocks in spite of his paraplegia. 12/08/17; seen in follow-up today at his usual appointment. He was seen earlier this week and found to have a new wound on his buttock. We also follow him for wounds on the left lateral leg, left first second toe web space and right first second toe web space 12/15/17; we have been using Hydrofera Blue to the left lateral leg which has improved. The right  first second toe web space has also improved. Left first second toe web space plantar aspect looks stable. The left buttock has worsened using Santyl. Apparently the buttock has drainage 12/22/17; we have been using Hydrofera Blue to the left lateral leg which continues to improve now 2 small wounds separated by normal skin. He tells [place] he had a fever up to 100 yesterday he is prone to UTIs but has not noted anything different. He does in and out catheterizations. The area between the first and second toes today does not look good necrotic surface covered with what looks to be purulent drainage and erythema extending into the third toe. I had gotten this to something that I thought look better last time however it is not look good today. He also has a necrotic surface over the buttock wound which is expanded. I thought there might be infection under here so I removed a lot of the surface with a #5 curet though nothing look like it really needed culturing. He is been using Santyl to this area 12/27/17; his original wound on the left lateral leg continues to improve using Hydrofera Blue. I  gave him samples of Baxdella although he was unable to take them out of fear for an allergic reaction ["lump in his throat"].the culture I did of the purulent drainage from his second toe last week showed both enterococcus and a set Enterobacter I was also concerned about the erythema on the bottom of his foot although paradoxically although this looks somewhat better today. 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 We've been using silver alginate here. They came up today that he sleeps in a recliner, I'm not sure why but I asked him to stop this 01/03/18; his original wound we've been using Hydrofera Blue is now separated into 2 areas. Ulcer on his left buttock is better he is off the recliner and sleeping in bed Finally both  wound areas between his first and second toes also looks some better 01/10/18; his original wound on the left lateral leg is now separated into 2 wounds we've been using Hydrofera Blue Ulcer on his left buttock has some drainage. There is a small probing site going into muscle layer superiorly.using silver alginate -He arrives today with a deep tissue injury on the left heel The wound on the dorsal aspect of his first second toe on the left looks a lot betterusing silver alginate ketoconazole The area on the first second toe web space on the right also looks a lot bette 01/17/18; his original wound on the left lateral leg continues to progress using Hydrofera Blue Ulcer on his left buttock also is smaller surface healthier except for a small probing site going into the muscle layer superiorly. 2.4 cm of tunneling in this area DTI on his left heel we have only been offloading. Looks better than last week no threatened open no evidence of infection the wound on the dorsal aspect of the first second toe on the left continues to look like it's regressing we have only been using silver alginate and terbinafine orally The area in the first second toe web space on the right also looks to be a lot better using silver alginate and terbinafine I think this was prompted by tinea pedis 01/31/18; the patient was hospitalized in [place] last week apparently for a complicated UTI. He was discharged on cefepime he does in and out catheterizations. In the hospital he was discovered M I don't mild elevation of AST and ALT and the terbinafine was stopped.predictably the pressure ulcer on s his buttock looks betterusing silver alginate. The area on the left lateral leg also is better using Hydrofera Blue. The area between the first and second toes on the left better. First and second toes on the right still substantial but better. Finally the DTI on the left heel has held together and looks like it's  resolving 02/07/18-he is here in follow-up evaluation for multiple ulcerations. He has new injury to the lateral aspect of the last issue a pressure ulcer, he states this is from adhesive removal trauma. He states he has tried multiple adhesive products with no success. All other ulcers appear stable. The left heel DTI is resolving. We will continue with same treatment plan and follow-up next week. 02/14/18; follow-up for multiple areas. He has a new area last week on the lateral aspect of his pressure ulcer more over the posterior trochanter. The original pressure ulcer looks quite stable has healthy granulation. 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 Almost fully epithelialized on the original superior area using Hydrofera Blue DTI on the left heel has peeled off this week to reveal a small superficial wound under denuded skin and subcutaneous tissue Both areas between the first and second toes look better including nothing open on the left 02/21/18; The patient's wounds on his left ischial tuberosity and posterior left greater trochanter actually looked better. He has a large area of irritation around the area which I think is contact dermatitis. I am doubtful that this is fungal His original wound on the left lateral calf continues to improve we have been using Hydrofera Blue There is no open area in the left first second toe web space although there is a lot of thick callus The DTI on the left heel required debridement today of necrotic surface eschar and subcutaneous tissue using silver alginate Finally the area on the right first second toe webspace continues to contract using silver alginate and ketoconazole 02/28/18 Left ischial tuberosity wounds look better using silver alginate. Original wound on the left calf only has one small open area left using Hydrofera Blue DTI on the left heel  required debridement mostly removing skin from around this wound surface. Using silver alginate The areas on the right first/second toe web space using silver alginate and ketoconazole 03/08/18 on evaluation today patient appears to be doing decently well as best I can tell in regard to his wounds. This is the first time that I have seen him as he generally is followed by Dr. Leanord Hawking. With that being said none of his wounds appear to be infected he does have an area where there is some skin covering what appears to be a new wound on the left dorsal surface of his great toe. This is right at the nail bed. With that being said I do believe that debrided away some of the excess skin can be of benefit in this regard. Otherwise he has been tolerating the dressing changes without complication. 03/14/18; patient arrives today with the multiplicity of wounds that we are following. He has not been systemically unwell Original wound on the left lateral calf now only has 2 small open areas we've been using Hydrofera Blue which should continue The deep tissue injury on the left heel requires debridement today. We've been using silver alginate The left first second toe and the right first second toe are both are reminiscence what I think was tinea pedis. Apparently some of the callus Surface between the toes was removed last week when it started draining. Purulent drainage coming from the wound on the ischial tuberosity on the left. 03/21/18-He is here in follow-up evaluation for multiple wounds. 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 He tolerated debridement. The only change to last week's recommendations is to discontinue antifungal cream between toes. He will follow-up next week 03/28/18; following up for multiple wounds;Concern this week is streaking redness and swelling in the right foot. He is going to need antibiotics for  this. 03/31/18; follow-up for right foot cellulitis. Streaking redness and swelling in the right foot on 03/28/18. 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 I put him on clindamycin 300 mg every 6 and brought him in for a quick check. He has an open wound between his first and second toes on the right foot as a potential source. 04/04/18; Right foot cellulitis is resolving he is  completing clindamycin. This is truly good news Left lateral calf wound which is initial wound only has one small open area inferiorly this is close to healing out. He has compression stockings. We will use Hydrofera Blue right down to the epithelialization of this Nonviable surface on the left heel which was initially pressure with a DTI. We've been using Hydrofera Blue. I'm going to switch this back to silver alginate Left first second toe/tinea pedis this looks better using silver alginate Right first second toe tinea pedis using silver alginate Large pressure ulcers on theLeft ischial tuberosity. Small wound here Looks better. I am uncertain about the surface over the large wound. Using silver alginate 04/11/18; Cellulitis in the right foot is resolved Left lateral calf wound which was his original wounds still has 2 tiny open areas remaining this is just about closed Nonviable surface on the left heel is better but still requires debridement Left first second toe/tinea pedis still open using silver alginate Right first second toe wound tinea pedis I asked him to go back to using ketoconazole and silver alginate Large pressure ulcers on the left ischial tuberosity this shear injury here is resolved. Wound is smaller. No evidence of infection using silver alginate 04/18/18; Patient arrives with an intense area of cellulitis in the right mid lower calf extending into the right heel area. Bright red and warm. Smaller area on the left anterior leg. He has a significant history of MRSA. He will definitely  need antibioticsdoxycycline He now has 2 open areas on the left ischial tuberosity the original large wound and now a satellite area which I think was above his initial satellite areas. Not a wonderful surface on this satellite area surrounding erythema which looks like pressure related. His left lateral calf wound again his original wound is just about closed Left heel pressure injury still requiring debridement Left first second toe looks a lot better using silver alginate Right first second toe also using silver alginate and ketoconazole cream also looks better 04/20/18; the patient was worked in early today out of concerns with his cellulitis on the right leg. I had started him on doxycycline. This was 2 days ago. His wife was concerned about the swelling in the area. Also concerned about the left buttock. He has not been systemically unwell no fever chills. No nausea vomiting or diarrhea 04/25/18; the patient's left buttock wound is continued to deteriorate he is using Hydrofera Blue. He is still completing clindamycin for the cellulitis on the right leg although all of this looks better. 05/02/18 Left buttock wound still with a lot of drainage and a very tightly adherent fibrinous necrotic surface. He has a deeper area superiorly The left lateral calf wound is still closed DTI wound on the left heel necrotic surface especially the circumference using Iodoflex Areas between his left first second toe and right first second toe both look better. Dorsally and the right first second toe he had a necrotic surface although at smaller. In using silver alginate and ketoconazole. I did a culture last week which was a deep tissue culture of the reminiscence of the open wound on the right first second toe dorsally. This grew a few Acinetobacter and a few methicillin-resistant staph aureus. Nevertheless the area actually this week looked better. I didn't feel the need to specifically address this at least  in terms of systemic antibiotics. 05/09/18; wounds are measuring larger more drainage per our intake. We are using Santyl covered with alginate on the large superficial  buttock wounds, Iodosorb on the left heel, ketoconazole and silver alginate to the dorsal first and second toes bilaterally. 05/16/18; The area on his left buttock better in some aspects although the area superiorly over the ischial tuberosity required an extensive debridement.using Santyl Left heel appears stable. Using Iodoflex The areas between his first and second toes are not bad however there is spreading erythema up the dorsal aspect of his left foot this looks like cellulitis again. He is insensate the erythema is really very brilliant.o Erysipelas He went to see an allergist days ago because he was itching part of this he had lab work done. This showed a white count of 15.1 with 70% neutrophils. Hemoglobin of 11.4 and a platelet count of 659,000. Last white count we had in Epic was a 2-1/2 years ago which was 25.9 but he was ill at the time. He was able to show me some lab work that was done by his primary physician the pattern is about the same. 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 But prompted me to go ahead and do x-rays of both feet and the pelvis rule out osteomyelitis. He also had a comprehensive metabolic panel this was reasonably normal his albumin was 3.7 liver function tests BUN/creatinine all normal 05/23/18; x-rays of both his feet from last week were negative for underlying pulmonary abnormality. The x-ray of his pelvis however showed mild irregularity in the left ischial which may represent some early osteomyelitis. The wound in the left ischial continues to get deeper clearly now exposed muscle. Each week necrotic surface material over this area. Whereas the rest of the wounds do not look so bad. The left ischial wound we have been using Santyl and calcium  alginate T the left heel surface necrotic debris using Iodoflex o The left lateral leg is still healed Areas on the left dorsal foot and the right dorsal foot are about the same. There is some inflammation on the left which might represent contact dermatitis, fungal dermatitis I am doubtful cellulitis although this looks better than last week 05/30/18; CT scan done at Hospital did not show any osteomyelitis or abscess. Suggested the possibility of underlying cellulitis although I don't see a lot of evidence of this at the bedside The wound itself on the left buttock/upper thigh actually looks somewhat better. No debridement Left heel also looks better no debridement continue Iodoflex Both dorsal first second toe spaces appear better using Lotrisone. Left still required debridement 06/06/18; Intake reported some purulent looking drainage from the left gluteal wound. Using Santyl and calcium alginate Left heel looks better although still a nonviable surface requiring debridement The left dorsal foot first/second webspace actually expanding and somewhat deeper. I may consider doing a shave biopsy of this area Right dorsal foot first/second webspace appears stable to improved. Using Lotrisone and silver alginate to both these areas 06/13/18 Left gluteal surface looks better. Now separated in the 2 wounds. No debridement required. Still drainage. We'll continue silver alginate Left heel continues to look better with Iodoflex continue this for at least another week Of his dorsal foot wounds the area on the left still has some depth although it looks better than last week. We've been using Lotrisone and silver alginate 06/20/18 Left gluteal continues to look better healthy tissue Left heel continues to look better healthy granulation wound is smaller. He is using Iodoflex and his long as this continues continue the Iodoflex Dorsal right foot looks  better unfortunately dorsal left foot does not. There is  swelling and erythema of his forefoot. He had minor trauma to this several days ago but doesn't think this was enough to have caused any tissue injury. Foot looks like cellulitis, we have had this problem before 06/27/18 on evaluation today patient appears to be doing a little worse in regard to his foot ulcer. 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 Both of which would really be his only options for treating this infection. In the past he has been given and effusion of Orbactiv. This is done very well for him in the past again it's one time dosing IV antibiotic therapy. Subsequently I do believe this is something we're gonna need to see about doing at this point in time. Currently his other wounds seem to be doing somewhat better in my pinion I'm pretty happy in that regard. 07/03/18 on evaluation today patient's wounds actually appear to be doing fairly well. He has been tolerating the dressing changes without complication. All in all he seems to be showing signs of improvement. In regard to the antibiotics he has been dealing with infectious disease since I saw him last week as far as getting this scheduled. In the end he's going [facility] this coming Friday. In the meantime he has been continuing to perform the dressing changes in such as previous. There does not appear to be any evidence of infection worsengin at this time. 07/10/18; Since I last saw this man 2 weeks ago things have actually improved. IV antibiotics of resulted in less forefoot erythema although there is still some present. He is not systemically unwell Left buttock wounds 2 now have no depth there is increased epithelialization Using silver alginate Left heel still requires debridement using Iodoflex Left dorsal foot still with a  sizable wound about the size of a border but healthy granulation Right dorsal foot still with a slitlike area using silver alginate 07/18/18; the patient's cellulitis in the left foot is improved in fact I think it is on its way to resolving. Left buttock wounds 2 both look better although the larger one has hypertension granulation we've been using silver alginate Left heel has some thick circumferential redundant skin over the wound edge which will need to be removed today we've been using Iodoflex Left dorsal foot is still a sizable wound required debridement using silver alginate The right dorsal foot is just about closed only a small open area remains here 07/25/18; left foot cellulitis is resolved Left buttock wounds 2 both look better. Hyper-granulation on the major area Left heel as some debris over the surface but otherwise looks a healthier wound. Using silver collagen Right dorsal foot is just about closed 07/31/18; arrives with our intake nurse worried about purulent drainage from the buttock. We had hyper-granulation here last week His buttock wounds 2 continue to look better Left heel some debris over the surface but measuring smaller. Right dorsal foot unfortunately has openings between the toes Left foot superficial wound looks less aggravated. 08/07/18 Buttock wounds continue to look better although some of her granulation and the larger medial wound. silver alginate Left heel continues to look a lot better.silver collagen Left foot superficial wound looks less stable. Requires debridement. He has a new wound superficial area on the  foot on the lateral dorsal foot. Right foot looks better using silver alginate without Lotrisone 08/14/2018; patient was in the ER last week diagnosed with a UTI. He is now on Cefpodoxime and Macrodantin. Buttock wounds continued to be smaller. Using silver alginate Left heel continues to look better using silver collagen Left foot superficial wound  looks as though it is improving Right dorsal foot area is just about healed. 08/21/2018; patient is completed his antibiotics for his UTI. He has 2 open areas on the buttocks. There is still not closed although the surface looks satisfactory. Using silver alginate Left heel continues to improve using silver collagen The bilateral dorsal foot areas which are at the base of his first and second toes/possible tinea pedis are actually stable on the left but worse on the right. The area on the left required debridement of necrotic surface. After debridement I obtained a specimen for PCR culture. The right dorsal foot which is been just about healed last week is now reopened 08/28/2018; culture done on the left dorsal foot showed coag negative staph both staph epidermidis and Lugdunensis. I think this is worthwhile initiating systemic treatment. I will use doxycycline given his long list of allergies. The area on the left heel slightly improved but still requiring debridement. The large wound on the buttock is just about closed whereas the smaller one is larger. Using silver alginate in this area 09/04/2018; patient is completing his doxycycline for the left foot although this continues to be a very difficult wound area with very adherent necrotic debris. We are using silver alginate to all his wounds right foot left foot and the small wounds on his buttock, silver collagen on the left heel. 09/11/2018; once again this patient has intense erythema and swelling of the left forefoot. Lesser degrees of erythema in the right foot. He has a long list of allergies and intolerances. I will reinstitute doxycycline. 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 Using silver alginate Left heel also looks better using silver collagen Unfortunately both the areas on his feet look worse. The area on the left first second webspace is now gone through to the plantar part of his  foot. The area on the left foot anteriorly is irritated with erythema and swelling in the forefoot. 09/25/2018 His wound on the left plantar heel looks better. Using silver collagen The area on the left buttock 2 small remnant areas. One is closed one is still open. Using silver alginate The areas between both his first and second toes look worse. This in spite of long-standing antifungal therapy with ketoconazole and silver alginate which should have antifungal activity He has small areas around his original wound on the left calf one is on the bottom of the original scar tissue and one superiorly both of these are small and superficial but again given wound history in this site this is worrisome 10/02/2018 Left plantar heel continues to gradually contract using silver collagen Left buttock wound is unchanged using silver alginate The areas on his dorsal feet between his first and second toes bilaterally look about the same. I prescribed clindamycin ointment to see if we can address chronic staph colonization and also the underlying possibility of erythrasma The left lateral lower extremity wound is actually on the lateral part of his ankle. Small open area here. We have been using silver alginate 10/09/2018; Left plantar heel continues to look healthy and contract. No debridement is required  Left buttock slightly smaller with a tape injury wound just below which was new this week Dorsal feet somewhat improved I have been using clindamycin Left lateral looks lower extremity the actual open area looks worse although a lot of this is epithelialized. I am going to change to silver collagen today He has a lot more swelling in the right leg although this is not pitting not red and not particularly warm there is a lot of spasm in the right leg usually indicative of people with paralysis of some underlying discomfort. We have reviewed his vascular status from 2017 he had a left greater saphenous vein  ablation. I wonder about referring him back to vascular surgery if the area on the left leg continues to deteriorate. 10/16/2018 in today for follow-up and management of multiple lower extremity ulcers. His left Buttock wound is much lower smaller and almost closed completely. The wound to the left ankle has began to reopen with Epithelialization and some adherent slough. He has multiple new areas to the left foot and leg. The left dorsal foot without much improvement. Wound present between left great webspace and 2nd toe. Erythema and edema present right leg. Right LE ultrasound obtained on 10/10/18 was negative for DVT . 10/23/2018; Left buttock is closed over. Still dry macerated skin but there is no open wound. I suspect this is chronic pressure/moisture Left lateral calf is quite a bit worse than when I saw this last. There is clearly drainage here he has macerated skin into the left plantar heel. We will change the primary dressing to alginate Left dorsal foot has some improvement in overall wound area. Still using clindamycin and silver alginate Right dorsal foot about the same as the left using clindamycin and silver alginate The erythema in the right leg has resolved. He is DVT rule out was negative Left heel pressure area required debridement although the wound is smaller and the surface is health 10/26/2018 The patient came back in for his nurse check today predominantly because of the drainage coming out of the left lateral leg with a recent reopening of his original wound on the left lateral calf. He comes in today with a large amount of surrounding erythema around the wound extending from the calf into the ankle and even in the area on the dorsal foot. He is not systemically unwell. He is not febrile. Nevertheless this looks like cellulitis. We have been using silver alginate to the area. I changed him to a regular visit and I am going to prescribe him doxycycline. The rationale here is  a long list of medication intolerances and a history of MRSA. I did not see anything that I thought would provide a valuable culture 10/30/2018 Follow-up from his appointment 4 days ago with really an extensive area of cellulitis in the left calf left lateral ankle and left dorsal foot. I put him on doxycycline. He has a long list of medication allergies which are true allergy reactions. 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 In any case he is a lot better today. The erythema especially in the anterior and lateral left calf is better. He still has left ankle erythema. He also is complaining about increasing edema in the right leg we have only been using Kerlix Coban and he has been doing the wraps at home. Finally he has a spotty rash on the medial part of his upper left calf which looks like folliculitis  or perhaps wrap occlusion type injury. Small superficial macules not pustules 11/06/18 patient arrives today with again a considerable degree of erythema around the wound on the left lateral calf extending into the dorsal ankle and dorsal foot. This is a lot worse than when I saw this last week. He is on doxycycline really with not a lot of improvement. He has not been systemically unwell Wounds on the; left heel actually looks improved. Original area on the left foot and proximity to the first and second toes looks about the same. He has superficial areas on the dorsal foot, anterior calf and then the reopening of his original wound on the left lateral calf which looks about the same The only area he has on the right is the dorsal webspace first and second which is smaller. He has a large area of dry erythematous skin on the left buttock small open area here. 11/13/2018; the patient arrives in much better condition. The erythema around the wound on the left lateral calf is a lot better. Not sure whether this was the clindamycin  or the TCA and ketoconazole or just in the improvement in edema control [stasis dermatitis]. In any case this is a lot better. The area on the left heel is very small and just about resolved using silver collagen we have been using silver alginate to the areas on his dorsal feet 11/20/2018; his wounds include the left lateral calf, left heel, dorsal aspects of both feet just proximal to the first second webspace. He is stable to slightly improved. I did not think any changes to his dressings were going to be necessary 11/27/2018 he has a reopening on the left buttock which is surrounded by what looks like tinea or perhaps some other form of dermatitis. The area on the left dorsal foot has some erythema around it I have marked this area but I am not sure whether this is cellulitis or not. Left heel is not closed. Left calf the reopening is really slightly longer and probably worse 1/13; in general things look better and smaller except for the left dorsal foot. Area on the left heel is just about closed, left buttock looks better only a small wound remains in the skin looks better [using Lotrisone] 1/20; the area on the left heel only has a few remaining open areas here. Left lateral calf about the same in terms of size, left dorsal foot slightly larger right lateral foot still not closed. The area on the left buttock has no open wound and the surrounding skin looks a lot better 1/27; the area on the left heel is closed. Left lateral calf better but still requiring extensive debridements. The area on his left buttock is closed. He still has the open areas on the left dorsal foot which is slightly smaller in the right foot which is slightly expanded. We have been using Iodoflex on these areas as well 2/3; left heel is closed. Left lateral calf still requiring debridement using Iodoflex there is no open area on his left buttock however he has dry scaly skin over a large area of this. Not really responding  well to the Lotrisone. Finally the areas on his dorsal feet at the level of the first second webspace are slightly smaller on the right and about the same on the left. Both of these vigorously debrided with Anasept and gauze 2/10; left heel remains closed he has dry erythematous skin over the left buttock but there is no open wound here.  Left lateral leg has come in and with. Still requiring debridement we have been using Iodoflex here. Finally the area on the left dorsal foot and right dorsal foot are really about the same extremely dry callused fissured areas. He does not yet have a dermatology appointment 2/17; left heel remains closed. He has a new open area on the left buttock. The area on the left lateral calf is bigger longer and still covered in necrotic debris. No major change in his foot areas bilaterally. I am awaiting for a dermatologist to look on this. We have been using ketoconazole I do not know that this is been doing any good at all. 2/24; left heel remains closed. The left buttock wound that was new reopening last week looks better. The left lateral calf appears better also although still requires debridement. The major area on his foot is the left first second also requiring debridement. We have been putting Prisma on all wounds. I do not believe that the ketoconazole has done too much good for his feet. He will use Lotrisone I am going to give him a 2-week course of terbinafine. We still do not have a dermatology appointment 3/2 left heel remains closed however there is skin over bone in this area I pointed this out to him today. The left buttock wound is epithelialized but still does not look completely stable. The area on the left leg required debridement were using silver collagen here. With regards to his feet we changed to Lotrisone last week and silver alginate. 3/9; left heel remains closed. Left buttock remains closed. The area on the right foot is essentially closed. The  left foot remains unchanged. Slightly smaller on the left lateral calf. Using silver collagen to both of these areas 3/16-Left heel remains closed. Area on right foot is closed. Left lateral calf above the lateral malleolus open wound requiring debridement with easy bleeding. Left dorsal wound proximal to first toe also debrided. Left ischial area open new. Patient has been using Prisma with wrapping every 3 days. Dermatology appointment is apparently tomorrow.Patient has completed his terbinafine 2-week course with some apparent improvement according to him, there is still flaking and dry skin in his foot on the left 3/23; area on the right foot is reopened. The area on the left anterior foot is about the same still a very necrotic adherent surface. He still has the area on the left leg and reopening is on the left buttock. He apparently saw dermatology although I do not have a note. According to the patient who is usually fairly well informed they did not have any good ideas. Put him on oral terbinafine which she is been on before. 3/30; using silver collagen to all wounds. Apparently his dermatologist put him on doxycycline and rifampin presumably some culture grew staph. I do not have this result. He remains on terbinafine although I have used terbinafine on him before 4/6; patient has had a fairly substantial reopening on the right foot between the first and second toes. He is finished his terbinafine and I believe is on doxycycline and rifampin still as prescribed by dermatology. We have been using silver collagen to all his wounds although the patient reports that he thinks silver alginate does better on the wounds on his buttock. 4/13; the area on his left lateral calf about the same size but it did not require debridement. Left dorsal foot just proximal to the webspace between the first and second toes is about the same. Still  nonviable surface. I note some superficial bronze discoloration  of the dorsal part of his foot Right dorsal foot just proximal to the first and second toes also looks about the same. I still think there may be the same discoloration I noted above on the left Left buttock wound looks about the same 4/20; left lateral calf appears to be gradually contracting using silver collagen. 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 The left dorsal foot wound is debrided of tightly adherent necrotic debris and really cleans up quite nicely. The right area is worse with expansion. I did not debride this it is now over the base of the second toe The area on his left buttock is smaller no debridement is required using silver collagen 5/4; left calf continues to make good progress. He arrives with erythema around the wounds on his dorsal foot which even extends to the plantar aspect. Very concerning for coexistent infection. He is finished the erythromycin I gave him for possible erythrasma this does not seem to have helped. The area on the left foot is about the same base of the dorsal toes Is area on the buttock looks improved on the left 5/11; left calf and left buttock continued to make good progress. Left foot is about the same to slightly improved. Major problem is on the right foot. He has not had an x-ray. Deep tissue culture I did last week showed both Enterobacter and E. coli. I did not change the doxycycline I put him on empirically although neither 1 of these were plated to doxycycline. He arrives today with the erythema looking worse on both the dorsal and plantar foot. Macerated skin on the bottom of the foot. he has not been systemically unwell 5/18-Patient returns at 1 week, left calf wound appears to be making some progress, left buttock wound appears slightly worse than last time, left foot wound looks slightly better, right foot redness is marginally better. X-ray of both feet show no air or evidence of  osteomyelitis. Patient is finished his Omnicef and terbinafine. He continues to have macerated skin on the bottom of the left foot as well as right 5/26; left calf wound is better, left buttock wound appears to have multiple small superficial open areas with surrounding macerated skin. X-rays that I did last time showed no evidence of osteomyelitis in either foot. He is finished cefdinir and doxycycline. I do not think that he was on terbinafine. He continues to have a large superficial open area on the right foot anterior dorsal and slightly between the first and second toes. I did send him to dermatology 2 months ago or so wondering about whether they would do a fungal scraping. I do not believe they did but did do a culture. We have been using silver alginate to the toe areas, he has been using antifungals at home topically either ketoconazole or Lotrisone. We are using silver collagen on the left foot, silver alginate on the right, silver collagen on the left lateral leg and silver alginate on the left buttock 6/1; left buttock area is healed. We have the left dorsal foot, left lateral leg and right dorsal foot. We are using silver alginate to the areas on both feet and silver collagen to the area on his left lateral calf 6/8; the left buttock apparently reopened late last week. He is not really sure how this happened. He is tolerating the terbinafine. Using silver alginate to all wounds 6/15; left buttock  wound is larger than last week but still superficial. Came in the clinic today with a report of purulence from the left lateral leg I did not identify any infection Both areas on his dorsal feet appear to be better. He is tolerating the terbinafine. Using silver alginate to all wounds 6/22; left buttock is about the same this week, left calf quite a bit better. His left foot is about the same however he comes in with erythema and warmth in the right forefoot once again. Culture that I gave him  in the beginning of May showed Enterobacter and E. coli. I gave him doxycycline and things seem to improve although neither 1 of these organisms was specifically plated. 6/29; left buttock is larger and dry this week. Left lateral calf looks to me to be improved. Left dorsal foot also somewhat improved right foot completely unchanged. The erythema on the right foot is still present. He is completing the Ceftin dinner that I gave him empirically [see discussion above.) 7/6 - All wounds look to be stable and perhaps improved, the left buttock wound is slightly smaller, per patient bleeds easily, completed ceftin, the right foot redness is less, he is on terbinafine 7/13; left buttock wound about the same perhaps slightly narrower. Area on the left lateral leg continues to narrow. Left dorsal foot slightly smaller right foot about the same. We are using silver alginate on the right foot and Hydrofera Blue to the areas on the left. Unna boot on the left 2 layer compression on the right 7/20; left buttock wound absolutely the same. Area on lateral leg continues to get better. Left dorsal foot require debridement as did the right no major change in the 7/27; left buttock wound the same size necrotic debris over the surface. The area on the lateral leg is closed once again. His left foot looks better right foot about the same although there is some involvement now of the posterior first second toe area. He is still on terbinafine which I have given him for a month, not certain a centimeter major change 06/25/19-All wounds appear to be slightly improved according to report, left buttock wound looks clean, both foot wounds have minimal to no debris the right dorsal foot has minimal slough. We are using Hydrofera Blue to the left and silver alginate to the right foot and ischial wound. 8/10-Wounds all appear to be around the same, the right forefoot distal part has some redness which was not there before,  however the wound looks clean and small. Ischial wound looks about the same with no changes 8/17; his wound on the left lateral calf which was his original chronic venous insufficiency wound remains closed. Since I last saw him the areas on the left dorsal foot right dorsal foot generally appear better but require debridement. The area on his left initial tuberosity appears somewhat larger to me perhaps hyper granulated and bleeds very easily. We have been using Hydrofera Blue to the left dorsal foot and silver alginate to everything else 8/24; left lateral calf remains closed. The areas on his dorsal feet on the webspace of the first and second toes bilaterally both look better. The area on the left buttock which is the pressure ulcer stage II slightly smaller. I change the dressing to Hydrofera Blue to all areas 8/31; left lateral calf remains closed. The area on his dorsal feet bilaterally look better. Using Hydrofera Blue. Still requiring debridement on the left foot. No change in the left buttock pressure  ulcers however 9/14; left lateral calf remains closed. Dorsal feet look quite a bit better than 2 weeks ago. Flaking dry skin also a lot better with the ammonium lactate I gave him 2 weeks ago. The area on the left buttock is improved. He states that his Roho cushion developed a leak and he is getting a new one, in the interim he is offloading this vigorously 9/21; left calf remains closed. Left heel which was a possible DTI looks better this week. He had macerated tissue around the left dorsal foot right foot looks satisfactory and improved left buttock wound. I changed his dressings to his feet to silver alginate bilaterally. Continuing Hydrofera Blue on the left buttock. 9/28 left calf remains closed. Left heel did not develop anything [possible DTI] dry flaking skin on the left dorsal foot. Right foot looks satisfactory. Improved left buttock wound. We are using silver alginate on his feet  Hydrofera Blue on the buttock. I have asked him to go back to the Lotrisone on his feet including the wounds and surrounding areas 10/5; left calf remains closed. The areas on the left and right feet about the same. A lot of this is epithelialized however debris over the remaining open areas. He is using Lotrisone and silver alginate. The area on the left buttock using Hydrofera Blue 10/26. Patient has been out for 3 weeks secondary to Covid concerns. He tested negative but I think his wife tested positive. He comes in today with the left foot substantially worse, right foot about the same. Even more concerning he states that the area on his left buttock closed over but then reopened and is considerably deeper in one aspect than it was before [stage III wound] 11/2; left foot really about the same as last week. Quarter sized wound on the dorsal foot just proximal to the first second toes. Surrounding erythema with areas of denuded epithelium. This is not really much different looking. Did not look like cellulitis this time however. Right foot area about the same.. We have been using silver alginate alginate on his toes Left buttock still substantial irritated skin around the wound which I think looks somewhat better. We have been using Hydrofera Blue here. 11/9; left foot larger than last week and a very necrotic surface. Right foot I think is about the same perhaps slightly smaller. Debris around the circumference also addressed. Unfortunately on the left buttock there is been a decline. Satellite lesions below the major wound distally and now a an additional one posteriorly we have been using Hydrofera Blue but I think this is a pressure issue 11/16; left foot ulcer dorsally again a very adherent necrotic surface. Right foot is about the same. Not much change in the pressure ulcer on his left buttock. 11/30; left foot ulcer dorsally basically the same as when I saw him 2 weeks ago. Very adherent  fibrinous debris on the wound surface. Patient reports a lot of drainage as well. 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 We have been using Iodoflex, patient changed back to alginate because of the drainage. Area on his right dorsal foot really looks benign with a healthier surface certainly a lot better than on the left. Left buttock wounds all improved using Hydrofera Blue 12/7; left dorsal foot again no improvement. Tightly adherent debris. PCR culture I did last week only showed likely skin contaminant. I have gone ahead and done a punch biopsy of this which is about the  last thing in terms of investigations I can think to do. He has known venous insufficiency and venous hypertension and this could be the issue here. The area on the right foot is about the same left buttock slightly worse according to our intake nurse secondary to [facility] Blue sticking to the wound 12/14; biopsy of the left foot that I did last time showed changes that could be related to wound healing/chronic stasis dermatitis phenomenon no neoplasm. We have been using silver alginate to both feet. I change the one on the left today to Sorbact and silver alginate to his other 2 wounds 12/28; the patient arrives with the following problems; Major issue is the dorsal left foot which continues to be a larger deeper wound area. Still with a completely nonviable surface Paradoxically the area mirror image on the right on the right dorsal foot appears to be getting better. He had some loss of dry denuded skin from the lower part of his original wound on the left lateral calf. Some of this area looked a little vulnerable and for this reason we put him in wrap that on this side this week The area on his left buttock is larger. He still has the erythematous circular area which I think is a combination of pressure, sweat. This does not look like cellulitis or fungal  dermatitis 11/26/2019; -Dorsal left foot large open wound with depth. Still debris over the surface. Using Sorbact The area on the dorsal right foot paradoxically has closed over He has a reopening on the left ankle laterally at the base of his original wound that extended up into the calf. This appears clean. The left buttock wound is smaller but with very adherent necrotic debris over the surface. We have been using silver alginate here as well The patient had arterial studies done in 2017. He had biphasic waveforms at the dorsalis pedis and posterior tibial bilaterally. ABI in the left was 1.17. Digit waveforms were dampened. He has slight spasticity in the great toes I do not think a TBI would be possible 1/11; the patient comes in today with a sizable reopening between the first and second toes on the right. This is not exactly in the same location where we have been treating wounds previously. According to our intake nurse this was actually fairly deep but 0.6 cm. The area on the left dorsal foot looks about the same the surface is somewhat cleaner using Sorbact, his MRI is in 2 days. We have not managed yet to get arterial studies. The new reopening on the left lateral calf looks somewhat better using alginate. The left buttock wound is about the same using alginate 1/18; the patient had his ARTERIAL studies which were quite normal. ABI in the right at 1.13 with triphasic/biphasic waveforms on the left ABI 1.06 again with triphasic/biphasic waveforms. It would not have been possible to have done a toe brachial index because of spasticity. We have been using Sorbac to the left foot alginate to the rest of his wounds on the right foot left lateral calf and left buttock 1/25; arrives in clinic with erythema and swelling of the left forefoot worse over the first MTP area. This extends laterally dorsally and but also posteriorly. Still has an area on the left lateral part of the lower part of his  calf wound it is eschared and clearly not closed. Area on the left buttock still with surrounding irritation and erythema. Right foot surface wound dorsally. The area between the right  and first and second toes appears better. 2/1; The left foot wound is about the same. Erythema slightly better I gave him a week of doxycycline empirically Right foot wound is more extensive extending between the toes to the plantar surface Left lateral calf really no open surface on the inferior part of his original wound however the entire area still looks vulnerable Absolutely no improvement in the left buttock wound required debridement. 2/8; the left foot is about the same. Erythema is slightly improved I gave him clindamycin last week. Right foot looks better he is using Lotrimin and silver alginate He has a breakdown in the left lateral calf. Denuded epithelium which I have removed Left buttock about the same were using Hydrofera Blue 2/15; left foot is about the same there is less surrounding erythema. Surface still has tightly adherent debris which I have debriding however not making any progress Right foot has a substantial wound on the medial right second toe between the first and second webspace. Still an open area on the left lateral calf distal area. Buttock wound is about the same 2/22; left foot is about the same less surrounding erythema. Surface has adherent debris. Polymen Ag Right foot area significant wound between the first and second toes. We have been using silver alginate here Left lateral leg polymen Ag at the base of his original venous insufficiency wound Left buttock some improvement here 3/1; Right foot is deteriorating in the first second toe webspace. Larger and more substantial. We have been using silver alginate. Left dorsal foot about the same markedly adherent surface debris using PolyMem Ag Left lateral calf surface debris using PolyMem AG Left buttock is improved again  using PolyMem Ag. He is completing his terbinafine. The erythema in the foot seems better. He has been on this for 2 weeks 3/8; no improvement in any wound area in fact he has a small open area on the dorsal midfoot which is new this week. He has not gotten his foot x-rays yet 3/15; his x-rays were both negative for osteomyelitis of both feet. No major change in any of his wounds on the extremities however his buttock wounds are better. We have been using polymen on the buttocks, left lower leg. Iodoflex on the left foot and silver alginate on the right 3/22; arrives in clinic today with the 2 major issues are the improvement in the left dorsal foot wound which for once actually looks healthy with a nice healthy wound surface without debridement. Using Iodoflex here. Unfortunately on the left lateral calf which is in the distal part of his original wound he came to the clinic here for there was purulent drainage noted some increased breakdown scattered around the original area and a small area proximally. We we are using polymen here will change to silver alginate today. His buttock wound on the left is better and I think the area on the right first second toe webspace is also improved 3/29; left dorsal foot looks better. Using Iodoflex. Left ankle culture from deterioration last time grew E. coli, Enterobacter and Enterococcus. I will give him a course of cefdinir although that will not cover Enterococcus. The area on the right foot in the webspace of the first and second toe lateral first toe looks better. The area on his buttock is about healed Vascular appointment is on April 21. This is to look at his venous system vis--vis continued breakdown of the wounds on the left including the left lateral leg and left dorsal  foot he. He has had previous ablations on this side 4/5; the area between the right first and second toes lateral aspect of the first toe looks better. Dorsal aspect of the left first  toe on the left foot also improved. Unfortunately the left lateral lower leg is larger and there is a second satellite wound superiorly. The usual superficial abrasions on the left buttock overall better but certainly not closed 4/12; the area between the right first and second toes is improved. Dorsal aspect of the left foot also slightly smaller with a vibrant healthy looking surface. No real change in the left lateral leg and the left buttock wound is healed He has an unaffordable co-pay for Apligraf. Appointment with vein and vascular with regards to the left leg venous part of the circulation is on 4/21 4/19; we continue to see improvement in all wound areas. Although this is minor. He has his vascular appointment on 4/21. The area on the left buttock has not reopened although right in the center of this area the skin looks somewhat threatened 4/26; the left buttock is unfortunately reopened. In general his left dorsal foot has a healthy surface and looks somewhat smaller although it was not measured as such. The area between his first and second toe webspace on the right as a small wound against the first toe. The patient saw vascular surgery. The real question I was asking was about the small saphenous vein on the left. He has previously ablated left greater saphenous vein. Nothing further was commented on on the left. Right greater saphenous vein without reflux at the saphenofemoral junction or proximal thigh there was no indication for ablation of the right greater saphenous vein duplex was negative for DVT bilaterally. They did not think there was anything from a vascular surgery point of view that could be offered. They ABIs within normal limits 5/3; only small open area on the left buttock. The area on the left lateral leg which was his original venous reflux is now 2 wounds both which look clean. We are using Iodoflex on the left dorsal foot which looks healthy and smaller. He is down to  a very tiny area between the right first and second toes, using silver alginate 5/10; all of his wounds appear better. We have much better edema control in 4 layer compression on the left. This may be the factor that is allowing the left foot and left lateral calf to heal. He has external compression garments at home 04/14/20-All of his wounds are progressing well, the left forefoot is practically closed, left ischium appears to be about the same, right toe webspace is also smaller. The left lateral leg is about the same, continue using Hydrofera Blue to this, silver alginate to the ischium, Iodoflex to the toe space on the right 6/7; most of his wounds outside of the left buttock are doing well. The area on the left lateral calf and left dorsal foot are smaller. The area on the right foot in between the first and second toe webspace is barely visible although he still says there is some drainage here is the only reason I did not heal this out. Unfortunately the area on the left buttock almost looks like he has a skin tear from tape. He has open wound and then a large flap of skin that we are trying to get adherence over an area just next to the remaining wound 6/21; 2 week follow-up. I believe is been here for nurse visits.  Miraculously the area between his first and second toes on the left dorsal foot is closed over. Still open on the right first second web space. The left lateral calf has 2 open areas. Distally this is more superficial. The proximal area had a little more depth and required debridement of adherent necrotic material. His buttock wound is actually larger we have been using silver alginate here 6/28; the patient's area on the left foot remains closed. Still open wet area between the first and second toes on the right and also extending into the plantar aspect. We have been using silver alginate in this location. He has 2 areas on the left lower leg part of his original long wounds which  I think are better. We have been using Hydrofera Blue here. Hydrofera Blue to the left buttock which is stable 7/12; left foot remains closed. Left ankle is closed. May be a small area between his right first and second toes the only truly open area is on the left buttock. We have been using Hydrofera Blue here 7/19; patient arrives with marked deterioration especially in the left foot and ankle. We did not put him in a compression wrap on the left last week in fact he wore his juxta lite stockings on either side although he does not have an underlying stocking. He has a reopening on the left dorsal foot, left lateral ankle and a new area on the right dorsal ankle. More worrisome is the degree of erythema on the left foot extending on the lateral foot into the lateral lower leg on the left 7/26; the patient had erythema and drainage from the lateral left ankle last week. Culture of this grew MRSA resistant to doxycycline and clindamycin which are the 2 antibiotics we usually use with this patient who has multiple antibiotic allergies including linezolid, trimethoprim sulfamethoxazole. I had give him an empiric doxycycline and he comes in the area certainly looks somewhat better although it is blotchy in his lower leg. He has not been systemically unwell. He has had areas on the left dorsal foot which is a reopening, chronic wounds on the left lateral ankle. Both of these I think are secondary to chronic venous insufficiency. The area between his first and second toes is closed as far as I can tell. He had a new wrap injury on the right dorsal ankle last week. Finally he has an area on the left buttock. We have been using silver alginate to everything except the left buttock we are using Hydrofera Blue 06/30/20-Patient returns at 1 week, has been given a sample dose pack of NUZYRA which is a tetracycline derivative [omadacycline], patient has completed those, we have been using silver alginate to almost  all the wounds except the left ischium where we are using Hydrofera Blue all of them look better 8/16; since I last saw the patient he has been doing well. The area on the left buttock, left lateral ankle and left foot are all closed today. He has completed the [place] I gave him last time and tolerated this well. He still has open areas on the right dorsal ankle and in the right first second toe area which we are using silver alginate. 8/23; we put him in his bilateral external compression stockings last week as he did not have anything open on either leg except for concerning area between the right first and second toe. He comes in today with an area on the left dorsal foot slightly more proximal than the  original wound, the left lateral foot but this is actually a continuation of the area he had on the left lateral ankle from last time. As well he is opened up on the left buttock again. 8/30; comes in today with things looking a lot better. The area on the left lower ankle has closed down as has the left foot but with eschar in both areas. The area on the dorsal right ankle is also epithelialized. Very little remaining of the left buttock wound. We have been using silver alginate on all wound areas 9/13; the area in the first second toe webspace on the right has fully epithelialized. He still has some vulnerable epithelium on the right and the ankle and the dorsal foot. He notes weeping. He is using his juxta lite stocking. On the left again the left dorsal foot is closed left lateral ankle is closed. We went to the juxta lite stocking here as well. Still vulnerable in the left buttock although only 2 small open areas remain here 9/27; 2-week follow-up. We did not look at his left leg but the patient says everything is closed. He is a bit disturbed by the amount of edema in his left foot he is using juxta lite stockings but asking about over the toes stockings which would be 30/40, will talk to him  next time. According to him there is no open wound on either the left foot or the left ankle/calf He has an open area on the dorsal right calf which I initially point a wrap injury. He has superficial remaining wound on the left ischial tuberosity been using silver alginate although he says this sticks to the wound 10/5; we gave him 2-week follow-up but he called yesterday expressing some concerns about his right foot right ankle and the left buttock. He came in early. There is still no open areas on the left leg and that still in his juxta lite stocking 10/11; he only has 1 small area on the left buttock that remains measuring millimeters 1 mm. Still has the same irritated skin in this area. We recommended zinc oxide when this eventually closes and pressure relief is meticulously is he can do this. He still has an area on the dorsal part of his right first through third toes which is a bit irritated and still open and on the dorsal ankle near the crease of the ankle. We have been using silver alginate and using his own stocking. He has nothing open on the left leg or foot 10/25; 2-week follow-up. Not nearly as good on the left buttock as I was hoping. For open areas with 5 looking threatened small. He has the erythematous irritated chronic skin in this area. 1 area on the right dorsal ankle. He reports this area bleeds easily Right dorsal foot just proximal to the base of his toes We have been using silver alginate. 11/8; 2-week follow-up. Left buttock is about the same although I do not think the wounds are in the same location we have been using silver alginate. I have asked him to use zinc oxide on the skin around the wounds. He still has a small area on the right dorsal ankle he reports this bleeds easily Right dorsal foot just proximal to the base of the toes does not have anything open although the skin is very dry and scaly He has a new opening on the nailbed of the left great toe. Nothing  on the left ankle 11/29; 3-week follow-up. Left buttock  has 2 open areas. And washing of these wounds today started bleeding easily. Suggesting very friable tissue. We have been using silver alginate. Right dorsal ankle which I thought was initially a wrap injury we have been using silver alginate. Nothing open between the toes that I can see. He states the area on the left dorsal toe nailbed healed after the last visit in 2 or 3 days 12/13; 3-week follow-up. His left buttock now has 3 open areas but the original 2 areas are smaller using polymen here. Surrounding skin looks better. The right dorsal ankle is closed. He has a small opening on the right dorsal foot at the level of the third toe. In general the skin looks better here. He is wearing his juxta lite stocking on the left leg says there is nothing open 11/24/2020; 3 weeks follow-up. His left buttock still has the 3 open areas. We have been using polymen but due to lack of response he changed to [employer] area. Surrounding skin is dry erythematous and irritated looking. There is no evidence of infection either bacterial or fungal however there is loss of surface epithelium He still has very dry skin in his foot causing irritation and erythema on the dorsal part of his toes. This is not responded to prolonged courses of antifungal simply looks dry and irritated 1/24; left buttock area still looks about the same he was unable to find the triad ointment that we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 have a healthy-looking surface. Small wound at the base of the left second toe however there is erythema in the left forefoot which is worrisome 12/16/16; is no open wounds on his right leg. We took measurements for stockings. His left anterior lateral leg wound continues to have a healthy-looking surface. I'm not sure where we were with the Apligraf run through his insurance. We have been using Iodoflex. He has a thick eschar on the left first second toe interface, I suspect this may be fungal however there is no visible open 12/23/16; no open wound on his right leg. He has 2 small areas left of the linear wound that was remaining last week. We have been using Prisma, I thought I have disclosed this week, we can only look forward to next week 01/03/17; the patient had concerning areas of erythema last week, already on doxycycline for UTI through his primary doctor. The erythema is absolutely no better there is warmth and swelling both medially from the left lateral leg wound and also the dorsal left foot. 01/06/17- Patient is here for follow-up evaluation of his left lateral leg ulcer and bilateral feet ulcers. He is on oral antibiotic therapy, tolerating that. Nursing staff and the patient states that the erythema is improved from Monday. 01/13/17; the predominant left lateral leg wound continues to be problematic. I had put Apligraf on him earlier this month once. However he subsequently developed what appeared to be an intense cellulitis around the left lateral leg wound. I gave him Dalvance I think on 2/12  perhaps 2/13 he continues on cefdinir. The erythema is still present but the warmth and swelling is improved. I am hopeful that the cellulitis part of this control. I wouldn't be surprised if there is an element of venous inflammation as well. 01/17/17. The erythema is present but better in the left leg. His left lateral leg wound still does not have a viable surface buttons certain parts of this long thin wound it appears like there has been improvement in dimensions. 01/20/17; the erythema still present but much better in the left leg. I'm thinking this is his usual degree of chronic venous inflammation. The wound on the left leg looks somewhat better. Is less surface slough 01/27/17; erythema is back to the chronic venous inflammation. The wound on the left leg is somewhat better. I am back to the point where I like to try an Apligraf once again 02/10/17; slight improvement in wound dimensions. Apligraf #2. He is completing his doxycycline 02/14/17; patient arrives today having completed doxycycline last Thursday. This was supposed to be a nurse visit however once again he hasn't tense erythema from the medial part of his wound extending over the lower leg. Also erythema in his foot this is roughly in the same distribution as last time. He has baseline chronic venous inflammation however this is a lot worse than the baseline I have learned to accept the on him is baseline inflammation 02/24/17- patient is here for follow-up evaluation. He is tolerating compression therapy. His voicing no complaints or concerns he is here anticipating an Apligraf 03/03/17; he arrives today with an adherent necrotic surface. I don't think this is surface is going to be amenable for Apligraf's. The erythema around his wound and on the left dorsal foot has resolved he is off antibiotics 03/10/17; better-looking surface today. I don't think he can  tolerate Apligraf's. He tells me he had a wound VAC after a skin graft years ago to  this area and they had difficulty with a seal. The erythema continues to be stable around this some degree of chronic venous inflammation but he also has recurrent cellulitis. We have been using Iodoflex 03/17/17; continued improvement in the surface and may be small changes in dimensions. Using Iodoflex which seems the only thing that will control his surface 03/24/17- He is here for follow up evaluation of his LLE lateral ulceration and ulcer to right dorsal foot/toe space. He is voicing no complaints or concerns, He is tolerating compression wrap. 03/31/17 arrives today with a much healthier looking wound on the left lower extremity. We have been using Iodoflex for a prolonged period of time which has for the first time prepared and adequate looking wound bed although we have not had much in the way of wound dimension improvement. He also has a small wound between the first and second toe on the right 04/07/17; arrives today with a healthy-looking wound bed and at least the top 50% of this wound appears to be now her. No debridement was required I have changed him to [facility] last week after prolonged Iodoflex. He did not do well with Apligraf's. We've had a re-opening between the first and second toe on the right 04/14/17; arrives today with a healthier looking wound bed contractions and the top 50% of this wound and some on the lesser 50%. Wound bed appears healthy. The area between the first and second toe on the right still remains problematic 04/21/17; continued very gradual improvement. Using [facility] 04/28/17; continued very gradual improvement in the left lateral leg venous insufficiency wound. His periwound erythema is very mild. We have been using Hydrofera Blue. Wound is making progress especially in the superior 50% 05/05/17; he continues to have very gradual improvement in the left lateral venous insufficiency wound. Both in terms with an length rings are improving. I debrided  this every 2 weeks with #5 curet and we have been using Hydrofera Blue and again making good progress With regards to the wounds between his right first and second toe which I thought might of been tinea pedis he is not making as much progress very dry scaly skin over the area. Also the area at the base of the left first and second toe in a similar condition 05/12/17; continued gradual improvement in the refractory left lateral venous insufficiency wound on the left. Dimension smaller. Surface still requiring debridement using Hydrofera Blue 05/19/17; continued gradual improvement in the refractory left lateral venous ulceration. Careful inspection of the wound bed underlying rumination suggested some degree of epithelialization over the surface no debridement indicated. Continue Hydrofera Blue difficult areas between his toes first and third on the left than first and second on the right. I'm going to change to silver alginate from silver collagen. Continue ketoconazole as I suspect underlying tinea pedis 05/26/17; left lateral leg venous insufficiency wound. We've been using Hydrofera Blue. I believe that there is expanding epithelialization over the surface of the wound albeit not coming from the wound circumference. This is a bit of an odd situation in which the epithelialization seems to be coming from the surface of the wound rather than in the exact circumference. There is still small open areas mostly along the lateral margin of the wound. ooHe has unchanged areas between the left first and second and the right first second toes which I been treating for  tenia pedis 06/02/17; left lateral leg venous insufficiency wound. We have been using Hydrofera Blue. Somewhat smaller from the wound circumference. The surface of the wound remains a bit on it almost epithelialized sedation in appearance. I use an open curette today debridement in the surface of all of this especially the edges ooSmall open  wounds remaining on the dorsal right first and second toe interspace and the plantar left first second toe and her face on the left 06/09/17; wound on the left lateral leg continues to be smaller but very gradual and very dry surface using Hydrofera Blue 06/16/17 requires weekly debridements now on the left lateral leg although this continues to contract. I changed to silver collagen last week because of dryness of the wound bed. Using Iodoflex to the areas on his first and second toes/web space bilaterally 06/24/17; patient with history of paraplegia also chronic venous insufficiency with lymphedema. Has a very difficult wound on the left lateral leg. This has been gradually reducing in terms of with but comes in with a very dry adherent surface. High switch to silver collagen a week or so ago with hydrogel to keep the area moist. This is been refractory to multiple dressing attempts. He also has areas in his first and second toes bilaterally in the anterior and posterior web space. I had been using Iodoflex here after a prolonged course of silver alginate with ketoconazole was ineffective [question tinea pedis] 07/14/17; patient arrives today with a very difficult adherent material over his left lateral lower leg wound. He also has surrounding erythema and poorly controlled edema. He was switched his Santyl last visit which the nurses are applying once during his doctor visit and once on a nurse visit. He was also reduced to 2 layer compression I'm not exactly sure of the issue here. 07/21/17; better surface today after 1 week of Iodoflex. Significant cellulitis that we treated last week also better. [Doxycycline] 07/28/17 better surface today with now 2 weeks of Iodoflex. Significant cellulitis treated with doxycycline. He has now completed the doxycycline and he is back to his usual degree of chronic venous inflammation/stasis dermatitis. He reminds me he has had ablations surgery here 08/04/17; continued  improvement with Iodoflex to the left lateral leg wound in terms of the surface of the wound although the dimensions are better. 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 Problematic areas on the plantar aspect of the first second toe web space on the left and the dorsal aspect of the first second toe web space on the right. At one point I felt these were probably related to chronic fungal infections in treated him aggressively for this although we have not made any improvement here. 08/11/17; left lateral leg. Surface continues to improve with the Iodoflex although we are not seeing much improvement in overall wound dimensions. Areas on his plantar left foot and right foot show no improvement. In fact the right foot looks somewhat worse 08/18/17; left lateral leg. We changed to [facility] last week after a prolonged course of Iodoflex which helps get the surface better. It appears that the wound with is improved. Continue with difficult areas on the left dorsal first second and plantar first second on the right 09/01/17; patient arrives in clinic today having had a temperature of 103 yesterday. He was seen in the ER and [facility]. The patient was concerned he could have cellulitis again in the right leg however they diagnosed him  with a UTI and he is now on Keflex. He has a history of cellulitis which is been recurrent and difficult but this is been in the left leg, in the past 5 use doxycycline. He does in and out catheterizations at home which are risk factors for UTI 09/08/17; patient will be completing his Keflex this weekend. The erythema on the left leg is considerably better. He has a new wound today on the medial part of the right leg small superficial almost looks like a skin tear. He has worsening of the area on the right dorsal first and second toe. His major area on the left lateral leg is better. Using Hydrofera Blue  on all areas 09/15/17; gradual reduction in width on the long wound in the left lateral leg. No debridement required. He also has wounds on the plantar aspect of his left first second toe web space and on the dorsal aspect of the right first second toe web space. 09/22/17; there continues to be very gradual improvements in the dimensions of the left lateral leg wound. He hasn't round erythematous spot with might be pressure on his wheelchair. There is no evidence obviously of infection no purulence no warmth ooHe has a dry scaled area on the plantar aspect of the left first second toe ooImproved area on the dorsal right first second toe. 09/29/17; left lateral leg wound continues to improve in dimensions mostly with an is still a fairly long but increasingly narrow wound. ooHe has a dry scaled area on the plantar aspect of his left first second toe web space ooIncreasingly concerning area on the dorsal right first second toe. In fact I am concerned today about possible cellulitis around this wound. The areas extending up his second toe and although there is deformities here almost appears to abut on the nailbed. 10/06/17; left lateral leg wound continues to make very gradual progress. Tissue culture I did from the right first second toe dorsal foot last time grew MRSA and enterococcus which was vancomycin sensitive. This was not sensitive to clindamycin or doxycycline. He is allergic to Zyvox and sulfa we have therefore arrange for him to have dalvance infusion tomorrow. He is had this in the past and tolerated it well 10/20/17; left lateral leg wound continues to make decent progress. This is certainly reduced in terms of with there is advancing epithelialization.ooThe cellulitis in the right foot looks better although he still has a deep wound in the dorsal aspect of the first second toe web space. Plantar left first toe web space on the left I think is making some progress 10/27/17; left  lateral leg wound continues to make decent progress. Advancing epithelialization.using Hydrofera Blue ooThe right first second toe web space wound is better-looking using silver alginate ooImprovement in the left plantar first second toe web space. Again using silver alginate 11/03/17 left lateral leg wound continues to make decent progress albeit slowly. Using Hydrofera Blue ooThe right per second toe web space continues to be a very problematic looking punched out wound. I obtained a piece of tissue for deep culture I did extensively treated this for fungus. It is difficult to imagine that this is a pressure area as the patient states other than going outside he doesn't really wear shoes at home ooThe left plantar first second toe web space looked fairly senescent. Necrotic edges. This required debridement oochange to Hydrofera Blue to all wound areas 11/10/17; left lateral leg wound continues to contract. Using Hydrofera Blue ooOn the right dorsal  first second toe web space dorsally. Culture I did of this area last week grew MRSA there is not an easy oral option in this patient was multiple antibiotic allergies or intolerances. This was only a rare culture isolate I'm therefore going to use Bactroban under silver alginate ooOn the left plantar first second toe web space. Debridement is required here. This is also unchanged 11/17/17; left lateral leg wound continues to contract using Hydrofera Blue this is no longer the major issue. ooThe major concern here is the right first second toe web space. He now has an open area going from dorsally to the plantar aspect. There is now wound on the inner lateral part of the first toe. Not a very viable surface on this. There is erythema spreading medially into the forefoot. ooNo major change in the left first second toe plantar wound 11/24/17; left lateral leg wound continues to contract using Hydrofera Blue. Nice improvement today ooThe right  first second toe web space all of this looks a lot less angry than last week. I have given him clindamycin and topical Bactroban for MRSA and terbinafine for the possibility of underlining tinea pedis that I could not control with ketoconazole. Looks somewhat better ooThe area on the plantar left first second toe web space is weeping with dried debris around the wound 12/01/17; left lateral leg wound continues to contract he Hydrofera Blue. It is becoming thinner in terms of with nevertheless it is making good improvement. ooThe right first second toe web space looks less angry but still a large necrotic-looking wounds starting on the plantar aspect of the right foot extending between the toes and now extensively on the base of the right second toe. I gave him clindamycin and topical Bactroban for MRSA anterior benefiting for the possibility of underlying tinea pedis. Not looking better today ooThe area on the left first/second toe looks better. Debrided of necrotic debris 12/05/17* the patient was worked in urgently today because over the weekend he found blood on his incontinence bad when he woke up. He was found to have an ulcer by his wife who does most of his wound care. He came in today for [place] to look at this. He has not had a history of wounds in his buttocks in spite of his paraplegia. 12/08/17; seen in follow-up today at his usual appointment. He was seen earlier this week and found to have a new wound on his buttock. We also follow him for wounds on the left lateral leg, left first second toe web space and right first second toe web space 12/15/17; we have been using Hydrofera Blue to the left lateral leg which has improved. The right first second toe web space has also improved. Left first second toe web space plantar aspect looks stable. The left buttock has worsened using Santyl. Apparently the buttock has drainage 12/22/17; we have been using Hydrofera Blue to the left lateral leg which  continues to improve now 2 small wounds separated by normal skin. He tells [place] he had a fever up to 100 yesterday he is prone to UTIs but has not noted anything different. He does in and out catheterizations. The area between the first and second toes today does not look good necrotic surface covered with what looks to be purulent drainage and erythema extending into the third toe. I had gotten this to something that I thought look better last time however it is not look good today. He also has a necrotic surface over  the buttock wound which is expanded. I thought there might be infection under here so I removed a lot of the surface with a #5 curet though nothing look like it really needed culturing. He is been using Santyl to this area 12/27/17; his original wound on the left lateral leg continues to improve using Hydrofera Blue. I gave him samples of Baxdella although he was unable to take them out of fear for an allergic reaction ["lump in his throat"].the culture I did of the purulent drainage from his second toe last week showed both enterococcus and a set Enterobacter I was also concerned about the erythema on the bottom of his foot although paradoxically although this looks somewhat better today. 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 We've been using silver alginate here. They came up today that he sleeps in a recliner, I'm not sure why but I asked him to stop this 01/03/18; his original wound we've been using Hydrofera Blue is now separated into 2 areas. ooUlcer on his left buttock is better he is off the recliner and sleeping in bed ooFinally both wound areas between his first and second toes also looks some better 01/10/18; his original wound on the left lateral leg is now separated into 2 wounds we've been using Hydrofera Blue ooUlcer on his left buttock has some drainage. There is a small  probing site going into muscle layer superiorly.using silver alginate -He arrives today with a deep tissue injury on the left heel ooThe wound on the dorsal aspect of his first second toe on the left looks a lot betterusing silver alginate ketoconazole ooThe area on the first second toe web space on the right also looks a lot bette 01/17/18; his original wound on the left lateral leg continues to progress using Hydrofera Blue ooUlcer on his left buttock also is smaller surface healthier except for a small probing site going into the muscle layer superiorly. 2.4 cm of tunneling in this area ooDTI on his left heel we have only been offloading. Looks better than last week no threatened open no evidence of infection oothe wound on the dorsal aspect of the first second toe on the left continues to look like it's regressing we have only been using silver alginate and terbinafine orally ooThe area in the first second toe web space on the right also looks to be a lot better using silver alginate and terbinafine I think this was prompted by tinea pedis 01/31/18; the patient was hospitalized in [place] last week apparently for a complicated UTI. He was discharged on cefepime he does in and out catheterizations. In the hospital he was discovered M I don't mild elevation of AST and ALT and the terbinafine was stopped.predictably the pressure ulcer on s his buttock looks betterusing silver alginate. The area on the left lateral leg also is better using Hydrofera Blue. The area between the first and second toes on the left better. First and second toes on the right still substantial but better. Finally the DTI on the left heel has held together and looks like it's resolving 02/07/18-he is here in follow-up evaluation for multiple ulcerations. He has new injury to the lateral aspect of the last issue a pressure ulcer, he states this is from adhesive removal trauma. He states he has tried multiple adhesive  products with no success. All other ulcers appear stable. The left heel DTI is resolving. We will  continue with same treatment plan and follow-up next week. 02/14/18; follow-up for multiple areas. ooHe has a new area last week on the lateral aspect of his pressure ulcer more over the posterior trochanter. The original pressure ulcer looks quite stable has healthy granulation. 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 Almost fully epithelialized on the original superior area using Hydrofera Blue ooDTI on the left heel has peeled off this week to reveal a small superficial wound under denuded skin and subcutaneous tissue ooBoth areas between the first and second toes look better including nothing open on the left 02/21/18; ooThe patient's wounds on his left ischial tuberosity and posterior left greater trochanter actually looked better. He has a large area of irritation around the area which I think is contact dermatitis. I am doubtful that this is fungal ooHis original wound on the left lateral calf continues to improve we have been using Hydrofera Blue ooThere is no open area in the left first second toe web space although there is a lot of thick callus ooThe DTI on the left heel required debridement today of necrotic surface eschar and subcutaneous tissue using silver alginate ooFinally the area on the right first second toe webspace continues to contract using silver alginate and ketoconazole 02/28/18 ooLeft ischial tuberosity wounds look better using silver alginate. ooOriginal wound on the left calf only has one small open area left using Hydrofera Blue ooDTI on the left heel required debridement mostly removing skin from around this wound surface. Using silver alginate ooThe areas on the right first/second toe web space using silver alginate and ketoconazole 03/08/18 on  evaluation today patient appears to be doing decently well as best I can tell in regard to his wounds. This is the first time that I have seen him as he generally is followed by Dr. Dellia Nims. With that being said none of his wounds appear to be infected he does have an area where there is some skin covering what appears to be a new wound on the left dorsal surface of his great toe. This is right at the nail bed. With that being said I do believe that debrided away some of the excess skin can be of benefit in this regard. Otherwise he has been tolerating the dressing changes without complication. 03/14/18; patient arrives today with the multiplicity of wounds that we are following. He has not been systemically unwell ooOriginal wound on the left lateral calf now only has 2 small open areas we've been using Hydrofera Blue which should continue ooThe deep tissue injury on the left heel requires debridement today. We've been using silver alginate ooThe left first second toe and the right first second toe are both are reminiscence what I think was tinea pedis. Apparently some of the callus Surface between the toes was removed last week when it started draining. ooPurulent drainage coming from the wound on the ischial tuberosity on the left. 03/21/18-He is here in follow-up evaluation for multiple wounds. 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 He tolerated debridement. The only change to last week's recommendations is to discontinue antifungal cream between toes. He will follow-up next week 03/28/18; following up for multiple wounds;Concern this week is streaking redness and swelling in the right foot. He is going to need antibiotics for this. 03/31/18; follow-up for right foot cellulitis. Streaking redness and swelling in the right  foot on 03/28/18. 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 I put him on  clindamycin 300 mg every 6 and brought him in for a quick check. He has an open wound between his first and second toes on the right foot as a potential source. 04/04/18; ooRight foot cellulitis is resolving he is completing clindamycin. This is truly good news ooLeft lateral calf wound which is initial wound only has one small open area inferiorly this is close to healing out. He has compression stockings. We will use Hydrofera Blue right down to the epithelialization of this ooNonviable surface on the left heel which was initially pressure with a DTI. We've been using Hydrofera Blue. I'm going to switch this back to silver alginate ooLeft first second toe/tinea pedis this looks better using silver alginate ooRight first second toe tinea pedis using silver alginate ooLarge pressure ulcers on theLeft ischial tuberosity. Small wound here Looks better. I am uncertain about the surface over the large wound. Using silver alginate 04/11/18; ooCellulitis in the right foot is resolved ooLeft lateral calf wound which was his original wounds still has 2 tiny open areas remaining this is just about closed ooNonviable surface on the left heel is better but still requires debridement ooLeft first second toe/tinea pedis still open using silver alginate ooRight first second toe wound tinea pedis I asked him to go back to using ketoconazole and silver alginate ooLarge pressure ulcers on the left ischial tuberosity this shear injury here is resolved. Wound is smaller. No evidence of infection using silver alginate 04/18/18; ooPatient arrives with an intense area of cellulitis in the right mid lower calf extending into the right heel area. Bright red and warm. Smaller area on the left anterior leg. He has a significant history of MRSA. He will definitely need antibioticsoodoxycycline ooHe now has 2 open areas on the left ischial tuberosity the original large wound and now a satellite area which I  think was above his initial satellite areas. Not a wonderful surface on this satellite area surrounding erythema which looks like pressure related. ooHis left lateral calf wound again his original wound is just about closed ooLeft heel pressure injury still requiring debridement ooLeft first second toe looks a lot better using silver alginate ooRight first second toe also using silver alginate and ketoconazole cream also looks better 04/20/18; the patient was worked in early today out of concerns with his cellulitis on the right leg. I had started him on doxycycline. This was 2 days ago. His wife was concerned about the swelling in the area. Also concerned about the left buttock. He has not been systemically unwell no fever chills. No nausea vomiting or diarrhea 04/25/18; the patient's left buttock wound is continued to deteriorate he is using Hydrofera Blue. He is still completing clindamycin for the cellulitis on the right leg although all of this looks better. 05/02/18 ooLeft buttock wound still with a lot of drainage and a very tightly adherent fibrinous necrotic surface. He has a deeper area superiorly ooThe left lateral calf wound is still closed ooDTI wound on the left heel necrotic surface especially the circumference using Iodoflex ooAreas between his left first second toe and right first second toe both look better. Dorsally and the right first second toe he had a necrotic surface although at smaller. In using silver alginate and ketoconazole. I did a culture last week which was a deep tissue culture of the reminiscence of the open wound on the right first second toe  dorsally. This grew a few Acinetobacter and a few methicillin-resistant staph aureus. Nevertheless the area actually this week looked better. I didn't feel the need to specifically address this at least in terms of systemic antibiotics. 05/09/18; wounds are measuring larger more drainage per our intake. We are using  Santyl covered with alginate on the large superficial buttock wounds, Iodosorb on the left heel, ketoconazole and silver alginate to the dorsal first and second toes bilaterally. 05/16/18; ooThe area on his left buttock better in some aspects although the area superiorly over the ischial tuberosity required an extensive debridement.using Santyl ooLeft heel appears stable. Using Iodoflex ooThe areas between his first and second toes are not bad however there is spreading erythema up the dorsal aspect of his left foot this looks like cellulitis again. He is insensate the erythema is really very brilliant.o Erysipelas He went to see an allergist days ago because he was itching part of this he had lab work done. This showed a white count of 15.1 with 70% neutrophils. Hemoglobin of 11.4 and a platelet count of 659,000. Last white count we had in Epic was a 2-1/2 years ago which was 25.9 but he was ill at the time. He was able to show me some lab work that was done by his primary physician the pattern is about the same. 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 But prompted me to go ahead and do x-rays of both feet and the pelvis rule out osteomyelitis. He also had a comprehensive metabolic panel this was reasonably normal his albumin was 3.7 liver function tests BUN/creatinine all normal 05/23/18; x-rays of both his feet from last week were negative for underlying pulmonary abnormality. The x-ray of his pelvis however showed mild irregularity in the left ischial which may represent some early osteomyelitis. The wound in the left ischial continues to get deeper clearly now exposed muscle. Each week necrotic surface material over this area. Whereas the rest of the wounds do not look so bad. ooThe left ischial wound we have been using Santyl and calcium alginate ooT the left heel surface necrotic debris using Iodoflex o ooThe left lateral leg is still  healed ooAreas on the left dorsal foot and the right dorsal foot are about the same. There is some inflammation on the left which might represent contact dermatitis, fungal dermatitis I am doubtful cellulitis although this looks better than last week 05/30/18; CT scan done at Hospital did not show any osteomyelitis or abscess. Suggested the possibility of underlying cellulitis although I don't see a lot of evidence of this at the bedside ooThe wound itself on the left buttock/upper thigh actually looks somewhat better. No debridement ooLeft heel also looks better no debridement continue Iodoflex ooBoth dorsal first second toe spaces appear better using Lotrisone. Left still required debridement 06/06/18; ooIntake reported some purulent looking drainage from the left gluteal wound. Using Santyl and calcium alginate ooLeft heel looks better although still a nonviable surface requiring debridement ooThe left dorsal foot first/second webspace actually expanding and somewhat deeper. I may consider doing a shave biopsy of this area ooRight dorsal foot first/second webspace appears stable to improved. Using Lotrisone and silver alginate to both these areas 06/13/18 ooLeft gluteal surface looks better. Now separated in the 2 wounds. No debridement required. Still drainage. We'll continue silver alginate ooLeft heel continues to look better with Iodoflex continue this for at least another week ooOf his dorsal foot wounds the area  on the left still has some depth although it looks better than last week. We've been using Lotrisone and silver alginate 06/20/18 ooLeft gluteal continues to look better healthy tissue ooLeft heel continues to look better healthy granulation wound is smaller. He is using Iodoflex and his long as this continues continue the Iodoflex ooDorsal right foot looks better unfortunately dorsal left foot does not. There is swelling and erythema of his forefoot. He had minor trauma  to this several days ago but doesn't think this was enough to have caused any tissue injury. Foot looks like cellulitis, we have had this problem before 06/27/18 on evaluation today patient appears to be doing a little worse in regard to his foot ulcer. 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 Both of which would really be his only options for treating this infection. In the past he has been given and effusion of Orbactiv. This is done very well for him in the past again it's one time dosing IV antibiotic therapy. Subsequently I do believe this is something we're gonna need to see about doing at this point in time. Currently his other wounds seem to be doing somewhat better in my pinion I'm pretty happy in that regard. 07/03/18 on evaluation today patient's wounds actually appear to be doing fairly well. He has been tolerating the dressing changes without complication. All in all he seems to be showing signs of improvement. In regard to the antibiotics he has been dealing with infectious disease since I saw him last week as far as getting this scheduled. In the end [facility]'s [facility] this coming Friday. In the meantime he has been continuing to perform the dressing changes in such as previous. There does not appear to be any evidence of infection worsengin at this time. 07/10/18; ooSince I last saw this man 2 weeks ago things have actually improved. IV antibiotics of resulted in less forefoot erythema although there is still some present. He is not systemically unwell ooLeft buttock wounds o2 now have no depth there is increased epithelialization Using silver alginate ooLeft heel still requires debridement using Iodoflex ooLeft dorsal foot still with a sizable wound about the size of a border but  healthy granulation ooRight dorsal foot still with a slitlike area using silver alginate 07/18/18; the patient's cellulitis in the left foot is improved in fact I think it is on its way to resolving. ooLeft buttock wounds o2 both look better although the larger one has hypertension granulation we've been using silver alginate ooLeft heel has some thick circumferential redundant skin over the wound edge which will need to be removed today we've been using Iodoflex ooLeft dorsal foot is still a sizable wound required debridement using silver alginate ooThe right dorsal foot is just about closed only a small open area remains here 07/25/18; left foot cellulitis is resolved ooLeft buttock wounds o2 both look better. Hyper-granulation on the major area ooLeft heel as some debris over the surface but otherwise looks a healthier wound. Using silver collagen ooRight dorsal foot is just about closed 07/31/18; arrives with our intake nurse worried about purulent drainage from the buttock. We had hyper-granulation here last week ooHis buttock wounds o2 continue to look better ooLeft heel some debris over the surface but measuring smaller. ooRight dorsal foot unfortunately  has openings between the toes ooLeft foot superficial wound looks less aggravated. 08/07/18 ooButtock wounds continue to look better although some of her granulation and the larger medial wound. silver alginate ooLeft heel continues to look a lot better.silver collagen ooLeft foot superficial wound looks less stable. Requires debridement. He has a new wound superficial area on the foot on the lateral dorsal foot. ooRight foot looks better using silver alginate without Lotrisone 08/14/2018; patient was in the ER last week diagnosed with a UTI. He is now on Cefpodoxime and Macrodantin. ooButtock wounds continued to be smaller. Using silver alginate ooLeft heel continues to look better using silver collagen ooLeft foot  superficial wound looks as though it is improving ooRight dorsal foot area is just about healed. 08/21/2018; patient is completed his antibiotics for his UTI. ooHe has 2 open areas on the buttocks. There is still not closed although the surface looks satisfactory. Using silver alginate ooLeft heel continues to improve using silver collagen ooThe bilateral dorsal foot areas which are at the base of his first and second toes/possible tinea pedis are actually stable on the left but worse on the right. The area on the left required debridement of necrotic surface. After debridement I obtained a specimen for PCR culture. ooThe right dorsal foot which is been just about healed last week is now reopened 08/28/2018; culture done on the left dorsal foot showed coag negative staph both staph epidermidis and Lugdunensis. I think this is worthwhile initiating systemic treatment. I will use doxycycline given his long list of allergies. The area on the left heel slightly improved but still requiring debridement. ooThe large wound on the buttock is just about closed whereas the smaller one is larger. Using silver alginate in this area 09/04/2018; patient is completing his doxycycline for the left foot although this continues to be a very difficult wound area with very adherent necrotic debris. We are using silver alginate to all his wounds right foot left foot and the small wounds on his buttock, silver collagen on the left heel. 09/11/2018; once again this patient has intense erythema and swelling of the left forefoot. Lesser degrees of erythema in the right foot. He has a long list of allergies and intolerances. I will reinstitute doxycycline. 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 Using silver alginate ooLeft heel also looks better using silver collagen ooUnfortunately both the areas on his feet look worse. The area on the left first second webspace  is now gone through to the plantar part of his foot. The area on the left foot anteriorly is irritated with erythema and swelling in the forefoot. 09/25/2018 ooHis wound on the left plantar heel looks better. Using silver collagen ooThe area on the left buttock 2 small remnant areas. One is closed one is still open. Using silver alginate ooThe areas between both his first and second toes look worse. This in spite of long-standing antifungal therapy with ketoconazole and silver alginate which should have antifungal activity ooHe has small areas around his original wound on the left calf one is on the bottom of the original scar tissue and one superiorly both of these are small and superficial but again given wound history in this site this is worrisome 10/02/2018 ooLeft plantar heel continues to gradually contract using silver collagen ooLeft buttock wound is unchanged using silver alginate ooThe areas on his dorsal feet between his first and second toes bilaterally look about the same. 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 Small open area here. We have been using silver alginate 10/09/2018; ooLeft plantar heel continues to look healthy and contract. No debridement is required ooLeft buttock slightly smaller with a tape injury wound just below which was new this week ooDorsal feet somewhat improved I have been using clindamycin ooLeft lateral looks lower extremity the actual open area looks worse although a lot of this is epithelialized. I am going to change to silver collagen today He has a lot more swelling in the right leg although this is not pitting not red and not particularly warm there is a lot of spasm in the right leg usually indicative of people with paralysis of some underlying  discomfort. We have reviewed his vascular status from 2017 he had a left greater saphenous vein ablation. I wonder about referring him back to vascular surgery if the area on the left leg continues to deteriorate. 10/16/2018 in today for follow-up and management of multiple lower extremity ulcers. His left Buttock wound is much lower smaller and almost closed completely. The wound to the left ankle has began to reopen with Epithelialization and some adherent slough. He has multiple new areas to the left foot and leg. The left dorsal foot without much improvement. Wound present between left great webspace and 2nd toe. Erythema and edema present right leg. Right LE ultrasound obtained on 10/10/18 was negative for DVT . 10/23/2018; ooLeft buttock is closed over. Still dry macerated skin but there is no open wound. I suspect this is chronic pressure/moisture ooLeft lateral calf is quite a bit worse than when I saw this last. There is clearly drainage here he has macerated skin into the left plantar heel. We will change the primary dressing to alginate ooLeft dorsal foot has some improvement in overall wound area. Still using clindamycin and silver alginate ooRight dorsal foot about the same as the left using clindamycin and silver alginate ooThe erythema in the right leg has resolved. He is DVT rule out was negative ooLeft heel pressure area required debridement although the wound is smaller and the surface is health 10/26/2018 ooThe patient came back in for his nurse check today predominantly because of the drainage coming out of the left lateral leg with a recent reopening of his original wound on the left lateral calf. He comes in today with a large amount of surrounding erythema around the wound extending from the calf into the ankle and even in the area on the dorsal foot. He is not systemically unwell. He is not febrile. Nevertheless this looks like cellulitis. We have been using  silver alginate to the area. I changed him to a regular visit and I am going to prescribe him doxycycline. The rationale here is a long list of medication intolerances and a history of MRSA. I did not see anything that I thought would provide a valuable culture 10/30/2018 ooFollow-up from his appointment 4 days ago with really an extensive area of cellulitis in the left calf left lateral ankle and left dorsal foot. I put him on doxycycline. He has a long list of medication allergies which are true allergy reactions. 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 In any case he is a lot better today. The erythema especially in the anterior  and lateral left calf is better. He still has left ankle erythema. He also is complaining about increasing edema in the right leg we have only been using Kerlix Coban and he has been doing the wraps at home. Finally he has a spotty rash on the medial part of his upper left calf which looks like folliculitis or perhaps wrap occlusion type injury. Small superficial macules not pustules 11/06/18 patient arrives today with again a considerable degree of erythema around the wound on the left lateral calf extending into the dorsal ankle and dorsal foot. This is a lot worse than when I saw this last week. He is on doxycycline really with not a lot of improvement. He has not been systemically unwell Wounds on the; left heel actually looks improved. Original area on the left foot and proximity to the first and second toes looks about the same. He has superficial areas on the dorsal foot, anterior calf and then the reopening of his original wound on the left lateral calf which looks about the same ooThe only area he has on the right is the dorsal webspace first and second which is smaller. ooHe has a large area of dry erythematous skin on the left buttock small open area here. 11/13/2018; the patient arrives  in much better condition. The erythema around the wound on the left lateral calf is a lot better. Not sure whether this was the clindamycin or the TCA and ketoconazole or just in the improvement in edema control [stasis dermatitis]. In any case this is a lot better. The area on the left heel is very small and just about resolved using silver collagen we have been using silver alginate to the areas on his dorsal feet 11/20/2018; his wounds include the left lateral calf, left heel, dorsal aspects of both feet just proximal to the first second webspace. He is stable to slightly improved. I did not think any changes to his dressings were going to be necessary 11/27/2018 he has a reopening on the left buttock which is surrounded by what looks like tinea or perhaps some other form of dermatitis. The area on the left dorsal foot has some erythema around it I have marked this area but I am not sure whether this is cellulitis or not. Left heel is not closed. Left calf the reopening is really slightly longer and probably worse 1/13; in general things look better and smaller except for the left dorsal foot. Area on the left heel is just about closed, left buttock looks better only a small wound remains in the skin looks better [using Lotrisone] 1/20; the area on the left heel only has a few remaining open areas here. Left lateral calf about the same in terms of size, left dorsal foot slightly larger right lateral foot still not closed. The area on the left buttock has no open wound and the surrounding skin looks a lot better 1/27; the area on the left heel is closed. Left lateral calf better but still requiring extensive debridements. The area on his left buttock is closed. He still has the open areas on the left dorsal foot which is slightly smaller in the right foot which is slightly expanded. We have been using Iodoflex on these areas as well 2/3; left heel is closed. Left lateral calf still requiring  debridement using Iodoflex there is no open area on his left buttock however he has dry scaly skin over a large area of this. Not really responding well to the Lotrisone.  Finally the areas on his dorsal feet at the level of the first second webspace are slightly smaller on the right and about the same on the left. Both of these vigorously debrided with Anasept and gauze 2/10; left heel remains closed he has dry erythematous skin over the left buttock but there is no open wound here. Left lateral leg has come in and with. Still requiring debridement we have been using Iodoflex here. Finally the area on the left dorsal foot and right dorsal foot are really about the same extremely dry callused fissured areas. He does not yet have a dermatology appointment 2/17; left heel remains closed. He has a new open area on the left buttock. The area on the left lateral calf is bigger longer and still covered in necrotic debris. No major change in his foot areas bilaterally. I am awaiting for a dermatologist to look on this. We have been using ketoconazole I do not know that this is been doing any good at all. 2/24; left heel remains closed. The left buttock wound that was new reopening last week looks better. The left lateral calf appears better also although still requires debridement. The major area on his foot is the left first second also requiring debridement. We have been putting Prisma on all wounds. I do not believe that the ketoconazole has done too much good for his feet. He will use Lotrisone I am going to give him a 2-week course of terbinafine. We still do not have a dermatology appointment 3/2 left heel remains closed however there is skin over bone in this area I pointed this out to him today. The left buttock wound is epithelialized but still does not look completely stable. The area on the left leg required debridement were using silver collagen here. With regards to his feet we changed to  Lotrisone last week and silver alginate. 3/9; left heel remains closed. Left buttock remains closed. The area on the right foot is essentially closed. The left foot remains unchanged. Slightly smaller on the left lateral calf. Using silver collagen to both of these areas 3/16-Left heel remains closed. Area on right foot is closed. Left lateral calf above the lateral malleolus open wound requiring debridement with easy bleeding. Left dorsal wound proximal to first toe also debrided. Left ischial area open new. Patient has been using Prisma with wrapping every 3 days. Dermatology appointment is apparently tomorrow.Patient has completed his terbinafine 2-week course with some apparent improvement according to him, there is still flaking and dry skin in his foot on the left 3/23; area on the right foot is reopened. The area on the left anterior foot is about the same still a very necrotic adherent surface. He still has the area on the left leg and reopening is on the left buttock. He apparently saw dermatology although I do not have a note. According to the patient who is usually fairly well informed they did not have any good ideas. Put him on oral terbinafine which she is been on before. 3/30; using silver collagen to all wounds. Apparently his dermatologist put him on doxycycline and rifampin presumably some culture grew staph. I do not have this result. He remains on terbinafine although I have used terbinafine on him before 4/6; patient has had a fairly substantial reopening on the right foot between the first and second toes. He is finished his terbinafine and I believe is on doxycycline and rifampin still as prescribed by dermatology. We have been using silver collagen  to all his wounds although the patient reports that he thinks silver alginate does better on the wounds on his buttock. 4/13; the area on his left lateral calf about the same size but it did not require debridement. ooLeft dorsal  foot just proximal to the webspace between the first and second toes is about the same. Still nonviable surface. I note some superficial bronze discoloration of the dorsal part of his foot ooRight dorsal foot just proximal to the first and second toes also looks about the same. I still think there may be the same discoloration I noted above on the left ooLeft buttock wound looks about the same 4/20; left lateral calf appears to be gradually contracting using silver collagen. 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 The left dorsal foot wound is debrided of tightly adherent necrotic debris and really cleans up quite nicely. The right area is worse with expansion. I did not debride this it is now over the base of the second toe ooThe area on his left buttock is smaller no debridement is required using silver collagen 5/4; left calf continues to make good progress. ooHe arrives with erythema around the wounds on his dorsal foot which even extends to the plantar aspect. Very concerning for coexistent infection. He is finished the erythromycin I gave him for possible erythrasma this does not seem to have helped. ooThe area on the left foot is about the same base of the dorsal toes ooIs area on the buttock looks improved on the left 5/11; left calf and left buttock continued to make good progress. Left foot is about the same to slightly improved. ooMajor problem is on the right foot. He has not had an x-ray. Deep tissue culture I did last week showed both Enterobacter and E. coli. I did not change the doxycycline I put him on empirically although neither 1 of these were plated to doxycycline. He arrives today with the erythema looking worse on both the dorsal and plantar foot. Macerated skin on the bottom of the foot. he has not been systemically unwell 5/18-Patient returns at 1 week, left calf wound appears to be making some progress,  left buttock wound appears slightly worse than last time, left foot wound looks slightly better, right foot redness is marginally better. X-ray of both feet show no air or evidence of osteomyelitis. Patient is finished his Omnicef and terbinafine. He continues to have macerated skin on the bottom of the left foot as well as right 5/26; left calf wound is better, left buttock wound appears to have multiple small superficial open areas with surrounding macerated skin. X-rays that I did last time showed no evidence of osteomyelitis in either foot. He is finished cefdinir and doxycycline. I do not think that he was on terbinafine. He continues to have a large superficial open area on the right foot anterior dorsal and slightly between the first and second toes. I did send him to dermatology 2 months ago or so wondering about whether they would do a fungal scraping. I do not believe they did but did do a culture. We have been using silver alginate to the toe areas, he has been using antifungals at home topically either ketoconazole or Lotrisone. We are using silver collagen on the left foot, silver alginate on the right, silver collagen on the left lateral leg and silver alginate on the left buttock 6/1; left buttock area is healed. We have the left dorsal foot, left lateral  leg and right dorsal foot. We are using silver alginate to the areas on both feet and silver collagen to the area on his left lateral calf 6/8; the left buttock apparently reopened late last week. He is not really sure how this happened. He is tolerating the terbinafine. Using silver alginate to all wounds 6/15; left buttock wound is larger than last week but still superficial. ooCame in the clinic today with a report of purulence from the left lateral leg I did not identify any infection ooBoth areas on his dorsal feet appear to be better. He is tolerating the terbinafine. Using silver alginate to all wounds 6/22; left buttock is  about the same this week, left calf quite a bit better. His left foot is about the same however he comes in with erythema and warmth in the right forefoot once again. Culture that I gave him in the beginning of May showed Enterobacter and E. coli. I gave him doxycycline and things seem to improve although neither 1 of these organisms was specifically plated. 6/29; left buttock is larger and dry this week. Left lateral calf looks to me to be improved. Left dorsal foot also somewhat improved right foot completely unchanged. The erythema on the right foot is still present. He is completing the Ceftin dinner that I gave him empirically [see discussion above.) 7/6 - All wounds look to be stable and perhaps improved, the left buttock wound is slightly smaller, per patient bleeds easily, completed ceftin, the right foot redness is less, he is on terbinafine 7/13; left buttock wound about the same perhaps slightly narrower. Area on the left lateral leg continues to narrow. Left dorsal foot slightly smaller right foot about the same. We are using silver alginate on the right foot and Hydrofera Blue to the areas on the left. Unna boot on the left 2 layer compression on the right 7/20; left buttock wound absolutely the same. Area on lateral leg continues to get better. Left dorsal foot require debridement as did the right no major change in the 7/27; left buttock wound the same size necrotic debris over the surface. The area on the lateral leg is closed once again. His left foot looks better right foot about the same although there is some involvement now of the posterior first second toe area. He is still on terbinafine which I have given him for a month, not certain a centimeter major change 06/25/19-All wounds appear to be slightly improved according to report, left buttock wound looks clean, both foot wounds have minimal to no debris the right dorsal foot has minimal slough. We are using Hydrofera Blue to the  left and silver alginate to the right foot and ischial wound. 8/10-Wounds all appear to be around the same, the right forefoot distal part has some redness which was not there before, however the wound looks clean and small. Ischial wound looks about the same with no changes 8/17; his wound on the left lateral calf which was his original chronic venous insufficiency wound remains closed. Since I last saw him the areas on the left dorsal foot right dorsal foot generally appear better but require debridement. The area on his left initial tuberosity appears somewhat larger to me perhaps hyper granulated and bleeds very easily. We have been using Hydrofera Blue to the left dorsal foot and silver alginate to everything else 8/24; left lateral calf remains closed. The areas on his dorsal feet on the webspace of the first and second toes bilaterally both look  better. The area on the left buttock which is the pressure ulcer stage II slightly smaller. I change the dressing to Hydrofera Blue to all areas 8/31; left lateral calf remains closed. The area on his dorsal feet bilaterally look better. Using Hydrofera Blue. Still requiring debridement on the left foot. No change in the left buttock pressure ulcers however 9/14; left lateral calf remains closed. Dorsal feet look quite a bit better than 2 weeks ago. Flaking dry skin also a lot better with the ammonium lactate I gave him 2 weeks ago. The area on the left buttock is improved. He states that his Roho cushion developed a leak and he is getting a new one, in the interim he is offloading this vigorously 9/21; left calf remains closed. Left heel which was a possible DTI looks better this week. He had macerated tissue around the left dorsal foot right foot looks satisfactory and improved left buttock wound. I changed his dressings to his feet to silver alginate bilaterally. Continuing Hydrofera Blue on the left buttock. 9/28 left calf remains closed. Left heel  did not develop anything [possible DTI] dry flaking skin on the left dorsal foot. Right foot looks satisfactory. Improved left buttock wound. We are using silver alginate on his feet Hydrofera Blue on the buttock. I have asked him to go back to the Lotrisone on his feet including the wounds and surrounding areas 10/5; left calf remains closed. The areas on the left and right feet about the same. A lot of this is epithelialized however debris over the remaining open areas. He is using Lotrisone and silver alginate. The area on the left buttock using Hydrofera Blue 10/26. Patient has been out for 3 weeks secondary to Covid concerns. He tested negative but I think his wife tested positive. He comes in today with the left foot substantially worse, right foot about the same. Even more concerning he states that the area on his left buttock closed over but then reopened and is considerably deeper in one aspect than it was before [stage III wound] 11/2; left foot really about the same as last week. Quarter sized wound on the dorsal foot just proximal to the first second toes. Surrounding erythema with areas of denuded epithelium. This is not really much different looking. Did not look like cellulitis this time however. ooRight foot area about the same.. We have been using silver alginate alginate on his toes ooLeft buttock still substantial irritated skin around the wound which I think looks somewhat better. We have been using Hydrofera Blue here. 11/9; left foot larger than last week and a very necrotic surface. Right foot I think is about the same perhaps slightly smaller. Debris around the circumference also addressed. Unfortunately on the left buttock there is been a decline. Satellite lesions below the major wound distally and now a an additional one posteriorly we have been using Hydrofera Blue but I think this is a pressure issue 11/16; left foot ulcer dorsally again a very adherent necrotic surface.  Right foot is about the same. Not much change in the pressure ulcer on his left buttock. 11/30; left foot ulcer dorsally basically the same as when I saw him 2 weeks ago. Very adherent fibrinous debris on the wound surface. Patient reports a lot of drainage as well. 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 We have been using Iodoflex, patient changed back to alginate because of the drainage. Area on his right dorsal foot really  looks benign with a healthier surface certainly a lot better than on the left. Left buttock wounds all improved using Hydrofera Blue 12/7; left dorsal foot again no improvement. Tightly adherent debris. PCR culture I did last week only showed likely skin contaminant. I have gone ahead and done a punch biopsy of this which is about the last thing in terms of investigations I can think to do. He has known venous insufficiency and venous hypertension and this could be the issue here. The area on the right foot is about the same left buttock slightly worse according to our intake nurse secondary to [facility] Blue sticking to the wound 12/14; biopsy of the left foot that I did last time showed changes that could be related to wound healing/chronic stasis dermatitis phenomenon no neoplasm. We have been using silver alginate to both feet. I change the one on the left today to Sorbact and silver alginate to his other 2 wounds 12/28; the patient arrives with the following problems; ooMajor issue is the dorsal left foot which continues to be a larger deeper wound area. Still with a completely nonviable surface ooParadoxically the area mirror image on the right on the right dorsal foot appears to be getting better. ooHe had some loss of dry denuded skin from the lower part of his original wound on the left lateral calf. Some of this area looked a little vulnerable and for this reason we put him in wrap that on this side this  week ooThe area on his left buttock is larger. He still has the erythematous circular area which I think is a combination of pressure, sweat. This does not look like cellulitis or fungal dermatitis 11/26/2019; -Dorsal left foot large open wound with depth. Still debris over the surface. Using Sorbact ooThe area on the dorsal right foot paradoxically has closed over [facility] has a reopening on the left ankle laterally at the base of his original wound that extended up into the calf. This appears clean. ooThe left buttock wound is smaller but with very adherent necrotic debris over the surface. We have been using silver alginate here as well The patient had arterial studies done in 2017. He had biphasic waveforms at the dorsalis pedis and posterior tibial bilaterally. ABI in the left was 1.17. Digit waveforms were dampened. He has slight spasticity in the great toes I do not think a TBI would be possible 1/11; the patient comes in today with a sizable reopening between the first and second toes on the right. This is not exactly in the same location where we have been treating wounds previously. According to our intake nurse this was actually fairly deep but 0.6 cm. The area on the left dorsal foot looks about the same the surface is somewhat cleaner using Sorbact, his MRI is in 2 days. We have not managed yet to get arterial studies. The new reopening on the left lateral calf looks somewhat better using alginate. The left buttock wound is about the same using alginate 1/18; the patient had his ARTERIAL studies which were quite normal. ABI in the right at 1.13 with triphasic/biphasic waveforms on the left ABI 1.06 again with triphasic/biphasic waveforms. It would not have been possible to have done a toe brachial index because of spasticity. We have been using Sorbac to the left foot alginate to the rest of his wounds on the right foot left lateral calf and left buttock 1/25; arrives in clinic with  erythema and swelling of the left forefoot worse  over the first MTP area. This extends laterally dorsally and but also posteriorly. Still has an area on the left lateral part of the lower part of his calf wound it is eschared and clearly not closed. ooArea on the left buttock still with surrounding irritation and erythema. ooRight foot surface wound dorsally. The area between the right and first and second toes appears better. 2/1; ooThe left foot wound is about the same. Erythema slightly better I gave him a week of doxycycline empirically ooRight foot wound is more extensive extending between the toes to the plantar surface ooLeft lateral calf really no open surface on the inferior part of his original wound however the entire area still looks vulnerable ooAbsolutely no improvement in the left buttock wound required debridement. 2/8; the left foot is about the same. Erythema is slightly improved I gave him clindamycin last week. ooRight foot looks better he is using Lotrimin and silver alginate ooHe has a breakdown in the left lateral calf. Denuded epithelium which I have removed ooLeft buttock about the same were using Hydrofera Blue 2/15; left foot is about the same there is less surrounding erythema. Surface still has tightly adherent debris which I have debriding however not making any progress ooRight foot has a substantial wound on the medial right second toe between the first and second webspace. ooStill an open area on the left lateral calf distal area. ooButtock wound is about the same 2/22; left foot is about the same less surrounding erythema. Surface has adherent debris. Polymen Ag Right foot area significant wound between the first and second toes. We have been using silver alginate here Left lateral leg polymen Ag at the base of his original venous insufficiency wound ooLeft buttock some improvement here 3/1; ooRight foot is deteriorating in the first second toe  webspace. Larger and more substantial. We have been using silver alginate. ooLeft dorsal foot about the same markedly adherent surface debris using PolyMem Ag ooLeft lateral calf surface debris using PolyMem AG ooLeft buttock is improved again using PolyMem Ag. ooHe is completing his terbinafine. The erythema in the foot seems better. He has been on this for 2 weeks 3/8; no improvement in any wound area in fact he has a small open area on the dorsal midfoot which is new this week. He has not gotten his foot x-rays yet 3/15; his x-rays were both negative for osteomyelitis of both feet. No major change in any of his wounds on the extremities however his buttock wounds are better. We have been using polymen on the buttocks, left lower leg. Iodoflex on the left foot and silver alginate on the right 3/22; arrives in clinic today with the 2 major issues are the improvement in the left dorsal foot wound which for once actually looks healthy with a nice healthy wound surface without debridement. Using Iodoflex here. Unfortunately on the left lateral calf which is in the distal part of his original wound he came to the clinic here for there was purulent drainage noted some increased breakdown scattered around the original area and a small area proximally. We we are using polymen here will change to silver alginate today. His buttock wound on the left is better and I think the area on the right first second toe webspace is also improved 3/29; left dorsal foot looks better. Using Iodoflex. Left ankle culture from deterioration last time grew E. coli, Enterobacter and Enterococcus. I will give him a course of cefdinir although that will not cover Enterococcus. The  area on the right foot in the webspace of the first and second toe lateral first toe looks better. The area on his buttock is about healed Vascular appointment is on April 21. This is to look at his venous system vis--vis continued breakdown of  the wounds on the left including the left lateral leg and left dorsal foot he. He has had previous ablations on this side 4/5; the area between the right first and second toes lateral aspect of the first toe looks better. Dorsal aspect of the left first toe on the left foot also improved. Unfortunately the left lateral lower leg is larger and there is a second satellite wound superiorly. The usual superficial abrasions on the left buttock overall better but certainly not closed 4/12; the area between the right first and second toes is improved. Dorsal aspect of the left foot also slightly smaller with a vibrant healthy looking surface. No real change in the left lateral leg and the left buttock wound is healed He has an unaffordable co-pay for Apligraf. Appointment with vein and vascular with regards to the left leg venous part of the circulation is on 4/21 4/19; we continue to see improvement in all wound areas. Although this is minor. He has his vascular appointment on 4/21. The area on the left buttock has not reopened although right in the center of this area the skin looks somewhat threatened 4/26; the left buttock is unfortunately reopened. In general his left dorsal foot has a healthy surface and looks somewhat smaller although it was not measured as such. The area between his first and second toe webspace on the right as a small wound against the first toe. The patient saw vascular surgery. The real question I was asking was about the small saphenous vein on the left. He has previously ablated left greater saphenous vein. Nothing further was commented on on the left. Right greater saphenous vein without reflux at the saphenofemoral junction or proximal thigh there was no indication for ablation of the right greater saphenous vein duplex was negative for DVT bilaterally. They did not think there was anything from a vascular surgery point of view that could be offered. They ABIs within normal  limits 5/3; only small open area on the left buttock. The area on the left lateral leg which was his original venous reflux is now 2 wounds both which look clean. We are using Iodoflex on the left dorsal foot which looks healthy and smaller. He is down to a very tiny area between the right first and second toes, using silver alginate 5/10; all of his wounds appear better. We have much better edema control in 4 layer compression on the left. This may be the factor that is allowing the left foot and left lateral calf to heal. He has external compression garments at home 04/14/20-All of his wounds are progressing well, the left forefoot is practically closed, left ischium appears to be about the same, right toe webspace is also smaller. The left lateral leg is about the same, continue using Hydrofera Blue to this, silver alginate to the ischium, Iodoflex to the toe space on the right 6/7; most of his wounds outside of the left buttock are doing well. The area on the left lateral calf and left dorsal foot are smaller. The area on the right foot in between the first and second toe webspace is barely visible although he still says there is some drainage here is the only reason I did not heal  this out. ooUnfortunately the area on the left buttock almost looks like he has a skin tear from tape. He has open wound and then a large flap of skin that we are trying to get adherence over an area just next to the remaining wound 6/21; 2 week follow-up. I believe is been here for nurse visits. Miraculously the area between his first and second toes on the left dorsal foot is closed over. Still open on the right first second web space. The left lateral calf has 2 open areas. Distally this is more superficial. The proximal area had a little more depth and required debridement of adherent necrotic material. His buttock wound is actually larger we have been using silver alginate here 6/28; the patient's area on the left  foot remains closed. Still open wet area between the first and second toes on the right and also extending into the plantar aspect. We have been using silver alginate in this location. He has 2 areas on the left lower leg part of his original long wounds which I think are better. We have been using Hydrofera Blue here. Hydrofera Blue to the left buttock which is stable 7/12; left foot remains closed. Left ankle is closed. May be a small area between his right first and second toes the only truly open area is on the left buttock. We have been using Hydrofera Blue here 7/19; patient arrives with marked deterioration especially in the left foot and ankle. We did not put him in a compression wrap on the left last week in fact he wore his juxta lite stockings on either side although he does not have an underlying stocking. He has a reopening on the left dorsal foot, left lateral ankle and a new area on the right dorsal ankle. More worrisome is the degree of erythema on the left foot extending on the lateral foot into the lateral lower leg on the left 7/26; the patient had erythema and drainage from the lateral left ankle last week. Culture of this grew MRSA resistant to doxycycline and clindamycin which are the 2 antibiotics we usually use with this patient who has multiple antibiotic allergies including linezolid, trimethoprim sulfamethoxazole. I had give him an empiric doxycycline and he comes in the area certainly looks somewhat better although it is blotchy in his lower leg. He has not been systemically unwell. He has had areas on the left dorsal foot which is a reopening, chronic wounds on the left lateral ankle. Both of these I think are secondary to chronic venous insufficiency. The area between his first and second toes is closed as far as I can tell. He had a new wrap injury on the right dorsal ankle last week. Finally he has an area on the left buttock. We have been using silver alginate to  everything except the left buttock we are using Hydrofera Blue 06/30/20-Patient returns at 1 week, has been given a sample dose pack of NUZYRA which is a tetracycline derivative [omadacycline], patient has completed those, we have been using silver alginate to almost all the wounds except the left ischium where we are using Hydrofera Blue all of them look better 8/16; since I last saw the patient he has been doing well. The area on the left buttock, left lateral ankle and left foot are all closed today. He has completed the [place] I gave him last time and tolerated this well. He still has open areas on the right dorsal ankle and in the right first second  toe area which we are using silver alginate. 8/23; we put him in his bilateral external compression stockings last week as he did not have anything open on either leg except for concerning area between the right first and second toe. He comes in today with an area on the left dorsal foot slightly more proximal than the original wound, the left lateral foot but this is actually a continuation of the area he had on the left lateral ankle from last time. As well he is opened up on the left buttock again. 8/30; comes in today with things looking a lot better. The area on the left lower ankle has closed down as has the left foot but with eschar in both areas. The area on the dorsal right ankle is also epithelialized. Very little remaining of the left buttock wound. We have been using silver alginate on all wound areas 9/13; the area in the first second toe webspace on the right has fully epithelialized. He still has some vulnerable epithelium on the right and the ankle and the dorsal foot. He notes weeping. He is using his juxta lite stocking. On the left again the left dorsal foot is closed left lateral ankle is closed. We went to the juxta lite stocking here as well. ooStill vulnerable in the left buttock although only 2 small open areas remain here 9/27;  2-week follow-up. We did not look at his left leg but the patient says everything is closed. He is a bit disturbed by the amount of edema in his left foot he is using juxta lite stockings but asking about over the toes stockings which would be 30/40, will talk to him next time. According to him there is no open wound on either the left foot or the left ankle/calf He has an open area on the dorsal right calf which I initially point a wrap injury. He has superficial remaining wound on the left ischial tuberosity been using silver alginate although he says this sticks to the wound 10/5; we gave him 2-week follow-up but he called yesterday expressing some concerns about his right foot right ankle and the left buttock. He came in early. There is still no open areas on the left leg and that still in his juxta lite stocking 10/11; he only has 1 small area on the left buttock that remains measuring millimeters 1 mm. Still has the same irritated skin in this area. We recommended zinc oxide when this eventually closes and pressure relief is meticulously is he can do this. He still has an area on the dorsal part of his right first through third toes which is a bit irritated and still open and on the dorsal ankle near the crease of the ankle. We have been using silver alginate and using his own stocking. He has nothing open on the left leg or foot 10/25; 2-week follow-up. Not nearly as good on the left buttock as I was hoping. For open areas with 5 looking threatened small. He has the erythematous irritated chronic skin in this area. oo1 area on the right dorsal ankle. He reports this area bleeds easily ooRight dorsal foot just proximal to the base of his toes ooWe have been using silver alginate. 11/8; 2-week follow-up. Left buttock is about the same although I do not think the wounds are in the same location we have been using silver alginate. I have asked him to use zinc oxide on the skin around the  wounds. ooHe still has a small  area on the right dorsal ankle he reports this bleeds easily ooRight dorsal foot just proximal to the base of the toes does not have anything open although the skin is very dry and scaly ooHe has a new opening on the nailbed of the left great toe. Nothing on the left ankle 11/29; 3-week follow-up. Left buttock has 2 open areas. And washing of these wounds today started bleeding easily. Suggesting very friable tissue. We have been using silver alginate. Right dorsal ankle which I thought was initially a wrap injury we have been using silver alginate. Nothing open between the toes that I can see. He states the area on the left dorsal toe nailbed healed after the last visit in 2 or 3 days 12/13; 3-week follow-up. His left buttock now has 3 open areas but the original 2 areas are smaller using polymen here. Surrounding skin looks better. The right dorsal ankle is closed. He has a small opening on the right dorsal foot at the level of the third toe. In general the skin looks better here. He is wearing his juxta lite stocking on the left leg says there is nothing open 11/24/2020; 3 weeks follow-up. His left buttock still has the 3 open areas. We have been using polymen but due to lack of response he changed to [facility] area. Surrounding skin is dry erythematous and irritated looking. There is no evidence of infection either bacterial or fungal however there is loss of surface epithelium ooHe still has very dry skin in his foot causing irritation and erythema on the dorsal part of his toes. This is not responded to prolonged courses of antifungal simply looks dry and irritated 1/24; left buttock area still looks about the same he was unable to find the triad ointment that we had suggested. The area on the right lower leg just above the dorsal ankle has reopened and the areas on the right foot between the first second and second third toes and scaling on the bottom of  the foot has been about the same for quite some time now. been using silver alginate to all wound areas 2/7; left buttock wound looked quite good although not much smaller in terms of surface area surrounding skin looks better. Only a few dry flaking areas on the right foot in between the first and second toes the skin generally looks better here [ammonium lactate]. Finally the area on the right dorsal ankle is closed 2/21; ooThere is no open area on the right foot even between the right first and second toe. Skin around this area dorsally and plantar aspects look better. ooHe has a reopening of the area on the right ankle just above the crease of the ankle dorsally. I continue to think that this is probably friction from spasms may be even this time with his stocking under the compression stockings. ooWounds on his left buttock look about the same there a couple of areas that have reopened. He has a total square area of loss of epithelialization. This does not look like infection it looks like a contact dermatitis but I just cannot determine to what 3/14; there is nothing on the right foot between the first and second toes this was carefully inspected under illumination. Some chronic irritation on the dorsal part of his foot from toes 1-3 at the base. Nothing really open here substantially. Still has an area on the right foot/ankle that is actually larger and hyper granulated. His buttock area on the left is just about  closed however he has chronic inflammation with loss of the surface epithelial layer 3/28; 2-week follow-up. In clinic today with a new wound on the left anterior mid tibia. Says this happened about 2 weeks ago. He is not really sure how wonders about the spasticity of his legs at night whether that could have caused this other than that he does not have a good idea. He has been using topical antibiotics and silver alginate. The area on his right dorsal ankle seems somewhat  better. ooFinally everything on his left buttock is closed. 4/11; 2-week follow-up. All of his wounds are better except for the area over the ischium and left buttock which have opened up widely again. At least part of this is covered in necrotic fibrinous material another part had rolled nonviable skin. The area on the right ankle, left anterior mid tibia are both a lot better. He had no open wounds on either foot including the areas between the first and second toes 4/25; patient presents for 2-week follow-up. He states that the wounds are overall stable. He has no complaints today and states he is using Hydrofera Blue to open wounds. 5/9; have not seen this man in over a month. For my memory he has open areas on the left mid tibia and right ankle. T oday he has new open area on the right dorsal foot which we have not had a problem with recently. He has the sustained area on the left buttock He is also changed his insurance at the beginning of the year [employer]. We will need prior authorizations for debridement 5/23; patient presents for 2-week follow-up. He has prior authorizations for debridement. He denies any issues in the past 2 weeks with his wound care. He has been using Hydrofera Blue to all the wounds. He does report a circular rash to the upper left leg that is new. He denies acute signs of infection. Patient History Information obtained from Patient. Family History Diabetes - Father, Hypertension - Mother, No family history of Cancer, Heart Disease, Hereditary Spherocytosis, Kidney Disease, Lung Disease, Stroke, Thyroid Problems, Tuberculosis. Social History Former smoker, Marital Status - Married, Alcohol Use - Moderate, Drug Use - No History, Caffeine Use - Daily. Medical History Ear/Nose/Mouth/Throat Denies history of Chronic sinus problems/congestion, Middle ear problems Hematologic/Lymphatic Denies history of Anemia, Hemophilia, Human Immunodeficiency Virus, Lymphedema,  Sickle Cell Disease Respiratory Patient has history of Sleep Apnea - does not tolerate cpap Denies history of Aspiration, Asthma, Chronic Obstructive Pulmonary Disease (COPD), Pneumothorax, Tuberculosis Cardiovascular Patient has history of Hypertension - lisinopril HCTZ Denies history of Angina, Arrhythmia, Congestive Heart Failure, Coronary Artery Disease, Deep Vein Thrombosis, Hypotension, Myocardial Infarction, Peripheral Arterial Disease, Peripheral Venous Disease, Phlebitis, Vasculitis Gastrointestinal Denies history of Cirrhosis , Colitis, Crohnoos, Hepatitis A, Hepatitis B, Hepatitis C Endocrine Denies history of Type I Diabetes, Type II Diabetes Genitourinary Denies history of End Stage Renal Disease Immunological Denies history of Lupus Erythematosus, Raynaudoos, Scleroderma Integumentary (Skin) Denies history of History of Burn Musculoskeletal Denies history of Gout, Rheumatoid Arthritis, Osteoarthritis, Osteomyelitis Neurologic Patient has history of Paraplegia Denies history of Dementia, Neuropathy, Quadriplegia, Seizure Disorder Oncologic Denies history of Received Chemotherapy, Received Radiation Psychiatric Denies history of Anorexia/bulimia, Confinement Anxiety Hospitalization/Surgery History - cellulitis in leg. - left leg vein ablation. Objective Constitutional respirations regular, non-labored and within target range for patient.. Vitals Time Taken: 8:17 AM, Height: 70 in, Weight: 216 lbs, BMI: 31, Temperature: 98.0 F, Pulse: 130 bpm, Respiratory Rate: 18 breaths/min, Blood Pressure: 121/73 mmHg.  Cardiovascular no clubbing, cyanosis, significant edema, Psychiatric pleasant and cooperative. General Notes: Right dorsal ankle: Area appears epithelialized although skin is extremely fragile. No signs of infection. Left anterior lower leg: Small open wound with sloughing in the center. Post debridement there was granulation tissue throughout. No signs of  infection. Left upper leg: Red circular rash. Left ischium: 3 areas of skin breakdown. 1 area had sloughing and this was debrided. Post debridement there was granulation tissue present. The other areas have granulation tissue throughout. No signs of infection. Integumentary (Hair, Skin) Wound #41R status is Open. Original cause of wound was Gradually Appeared. The date acquired was: 03/16/2020. The wound has been in treatment 56 weeks. The wound is located on the Left Ischium. The wound measures 4.5cm length x 3cm width x 0.2cm depth; 10.603cm^2 area and 2.121cm^3 volume. Wound #50 status is Open. Original cause of wound was Gradually Appeared. The date acquired was: 01/12/2021. The wound has been in treatment 13 weeks. The wound is located on the Right,Dorsal Ankle. The wound measures 0.1cm length x 0.1cm width x 0.1cm depth; 0.008cm^2 area and 0.001cm^3 volume. Wound #51 status is Open. Original cause of wound was Trauma. The date acquired was: 01/26/2021. The wound has been in treatment 8 weeks. The wound is located on the Left,Anterior Lower Leg. The wound measures 1.4cm length x 0.9cm width x 0.1cm depth; 0.99cm^2 area and 0.099cm^3 volume. Wound #52 status is Open. Original cause of wound was Gradually Appeared. The date acquired was: 03/27/2021. The wound has been in treatment 2 weeks. The wound is located on the Right,Dorsal Foot. The wound measures 0.2cm length x 0.2cm width x 0.1cm depth; 0.031cm^2 area and 0.003cm^3 volume. 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 Janalyn Rouse. Patient's  wounds are stable. There are no signs of infection. There are 2 areas that were debrided and there was granulation tissue postdebridement. We will continue with Hydrofera Blue to all wounds. he does appear to have ringworm on his left upper leg and I recommended over-the-counter antifungal cream. He can follow-up in 2 weeks. Procedures Wound #41R Pre-procedure diagnosis of Wound #41R is a Pressure Ulcer located on the Left Ischium . There was a Excisional Skin/Subcutaneous Tissue Debridement with a total area of 1 sq cm performed by Kalman Shan, DO. With the following instrument(s): Curette to remove Non-Viable tissue/material. Material removed includes Subcutaneous Tissue. A time out was conducted at 09:10, prior to the start of the procedure. A Minimum amount of bleeding was controlled with Pressure. The procedure was tolerated well with a pain level of 0 throughout and a pain level of 0 following the procedure. Post Debridement Measurements: 4.5cm length x 3cm width x 0.2cm depth; 2.121cm^3 volume. Post debridement Stage noted as Category/Stage II. Character of Wound/Ulcer Post Debridement is improved. Post procedure Diagnosis Wound #41R: Same as Pre-Procedure Wound #51 Pre-procedure diagnosis of Wound #51 is a Trauma, Other located on the Left,Anterior Lower Leg . There was a Excisional Skin/Subcutaneous Tissue Debridement with a total area of 1.26 sq cm performed by Kalman Shan, DO. With the following instrument(s): Curette to remove Non-Viable tissue/material. Material removed includes Subcutaneous Tissue.  A time out was conducted at 09:10, prior to the start of the procedure. A Minimum amount of bleeding was controlled with Pressure. The procedure was tolerated well with a pain level of 0 throughout and a pain level of 0 following the procedure. Post Debridement Measurements: 1.4cm length x 0.9cm width x 0.1cm depth; 0.099cm^3 volume. Character of Wound/Ulcer Post Debridement is  improved. Post procedure Diagnosis Wound #51: Same as Pre-Procedure Plan Follow-up Appointments: Return Appointment in 2 weeks. - with Dr. Dellia Nims Bathing/ Shower/ Hygiene: May shower and wash wound with soap and water. - on days that dressing is changed Edema Control - Lymphedema / SCD / Other: Elevate legs to the level of the heart or above for 30 minutes daily and/or when sitting, a frequency of: - throughout the day Compression stocking or Garment 30-40 mm/Hg pressure to: - Juxtalite to both legs daily Off-Loading: Roho cushion for wheelchair Turn and reposition every 2 hours WOUND #41R: - Ischium Wound Laterality: Left Cleanser: Soap and Water Every Other Day/30 Days Discharge Instructions: May shower and wash wound with dial antibacterial soap and water prior to dressing change. Prim Dressing: Hydrofera Blue Ready Foam, 4x5 in Every Other Day/30 Days ary Discharge Instructions: Apply to wound bed as instructed Secondary Dressing: ComfortFoam Border, 4x4 in (silicone border) Every Other Day/30 Days Discharge Instructions: Apply over primary dressing as directed. 1. In office sharp debridement 2. Antifungal cream to the circular rash on the left lower extremity 3. Hydrofera Blue to all wounds 4. Juxta light compression 5. Follow-up in 2 weeks with Dr. Dellia Nims Electronic Signature(s) Signed: 04/13/2021 9:23:53 AM By: Kalman Shan DO Entered By: Kalman Shan on 04/13/2021 09:22:53 -------------------------------------------------------------------------------- HxROS Details Patient Name: Date of Service: Lipsky, [name] 04/13/2021 8:00 A M Medical Record Number: [id number] Patient Account Number: [id number] Date of Birth/Sex: Treating RN: 02-08-1986 (35 y.o. [name] Primary Care Provider: [place], [place] Other Clinician: Referring Provider: Treating Provider/Extender: Carmell Austria [name]: 52 Information Obtained  From Patient Ear/Nose/Mouth/Throat Medical History: Negative for: Chronic sinus problems/congestion; Middle ear problems Hematologic/Lymphatic Medical History: Negative for: Anemia; Hemophilia; Human Immunodeficiency Virus; Lymphedema; Sickle Cell Disease Respiratory Medical History: Positive for: Sleep Apnea - does not tolerate cpap Negative for: Aspiration; Asthma; Chronic Obstructive Pulmonary Disease (COPD); Pneumothorax; Tuberculosis Cardiovascular Medical History: Positive for: Hypertension - lisinopril HCTZ Negative for: Angina; Arrhythmia; Congestive Heart Failure; Coronary Artery Disease; Deep Vein Thrombosis; Hypotension; Myocardial Infarction; Peripheral Arterial Disease; Peripheral Venous Disease; Phlebitis; Vasculitis Gastrointestinal Medical History: Negative for: Cirrhosis ; Colitis; Crohns; Hepatitis A; Hepatitis B; Hepatitis C Endocrine Medical History: Negative for: Type I Diabetes; Type II Diabetes Genitourinary Medical History: Negative for: End Stage Renal Disease Immunological Medical History: Negative for: Lupus Erythematosus; Raynauds; Scleroderma Integumentary (Skin) Medical History: Negative for: History of Burn Musculoskeletal Medical History: Negative for: Gout; Rheumatoid Arthritis; Osteoarthritis; Osteomyelitis Neurologic Medical History: Positive for: Paraplegia Negative for: Dementia; Neuropathy; Quadriplegia; Seizure Disorder Oncologic Medical History: Negative for: Received Chemotherapy; Received Radiation Psychiatric Medical History: Negative for: Anorexia/bulimia; Confinement Anxiety Immunizations Pneumococcal Vaccine: Received Pneumococcal Vaccination: No Immunization Notes: doesn't remember when last tetanus shot was Implantable Devices No devices added Hospitalization / Surgery History Type of Hospitalization/Surgery cellulitis in leg left leg vein ablation Family and Social History Cancer: No; Diabetes: Yes - Father;  Heart Disease: No; Hereditary Spherocytosis: No; Hypertension: Yes - Mother; Kidney Disease: No; Lung Disease: No; Stroke: No; Thyroid Problems: No; Tuberculosis: No; Former smoker; Marital Status - Married; Alcohol Use: Moderate; Drug Use: No  History; Caffeine Use: Daily; Financial Concerns: No; Food, Clothing or Shelter Needs: No; Support System Lacking: No; Transportation Concerns: No Electronic Signature(s) Signed: 04/13/2021 9:23:53 AM By: Kalman Shan DO Signed: 04/13/2021 5:27:53 PM By: Levan Hurst RN, BSN Entered By: Kalman Shan on 04/13/2021 09:20:09 -------------------------------------------------------------------------------- Nuevo Details Patient Name: Date of Service: Whitener, [name] 04/13/2021 Medical Record Number: [id number] Patient Account Number: [id number] Date of Birth/Sex: Treating RN: 1986-10-16 (35 y.o. [name] Primary Care Provider: [place], [place] Other Clinician: Referring Provider: Treating Provider/Extender: Nena Alexander, [name]: 275 Diagnosis Coding ICD-10 Codes Code Description I87.332 Chronic venous hypertension (idiopathic) with ulcer and inflammation of left lower extremity L97.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, 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, complete L97.821 Non-pressure chronic ulcer of other part of left lower leg limited to breakdown of skin L97.311 Non-pressure chronic ulcer of right ankle limited to breakdown of skin Facility Procedures CPT4 Code: [id number] I L L Description: [id number] - DEB SUBQ TISSUE 20 SQ CM/< ICD-10 Diagnosis Description 87.332 Chronic venous hypertension (idiopathic) with ulcer and inflammation of left lo 89.323 Pressure ulcer of left buttock, stage 3 97.821 Non-pressure chronic ulcer of other  part of left lower leg limited to breakdown Modifier: wer extremity of skin Quantity:  1 Physician Procedures : CPT4 Code Description Modifier [id number] - WC PHYS SUBQ TISS 20 SQ CM ICD-10 Diagnosis Description I87.332 Chronic venous hypertension (idiopathic) with ulcer and inflammation of left lower extremity L89.323 Pressure ulcer of left buttock, stage 3  L97.821 Non-pressure chronic ulcer of other part of left lower leg limited to breakdown of skin Quantity: 1 Electronic Signature(s) Signed: 04/13/2021 9:23:53 AM By: Kalman Shan DO Entered By: Kalman Shan on 04/13/2021 09:23:30

## 2021-04-27 ENCOUNTER — Encounter (HOSPITAL_BASED_OUTPATIENT_CLINIC_OR_DEPARTMENT_OTHER): Payer: 59 | Attending: Internal Medicine | Admitting: Internal Medicine

## 2021-04-27 ENCOUNTER — Other Ambulatory Visit: Payer: Self-pay

## 2021-04-27 DIAGNOSIS — G8221 Paraplegia, complete: Secondary | ICD-10-CM | POA: Diagnosis not present

## 2021-04-27 DIAGNOSIS — I87332 Chronic venous hypertension (idiopathic) with ulcer and inflammation of left lower extremity: Secondary | ICD-10-CM | POA: Diagnosis present

## 2021-04-27 DIAGNOSIS — L89323 Pressure ulcer of left buttock, stage 3: Secondary | ICD-10-CM | POA: Diagnosis not present

## 2021-04-27 DIAGNOSIS — I890 Lymphedema, not elsewhere classified: Secondary | ICD-10-CM | POA: Diagnosis not present

## 2021-04-27 DIAGNOSIS — L97311 Non-pressure chronic ulcer of right ankle limited to breakdown of skin: Secondary | ICD-10-CM | POA: Diagnosis not present

## 2021-04-27 DIAGNOSIS — L97512 Non-pressure chronic ulcer of other part of right foot with fat layer exposed: Secondary | ICD-10-CM | POA: Diagnosis not present

## 2021-04-27 DIAGNOSIS — Z9081 Acquired absence of spleen: Secondary | ICD-10-CM | POA: Diagnosis not present

## 2021-04-27 DIAGNOSIS — L97822 Non-pressure chronic ulcer of other part of left lower leg with fat layer exposed: Secondary | ICD-10-CM | POA: Insufficient documentation

## 2021-04-29 NOTE — Progress Notes (Signed)
[name], KEANU [name] ([id number]) Visit Report for 04/27/2021 Arrival Information Details Patient Name: Date of Service: [name], [name] 04/27/2021 8:00 A M Medical Record Number: [id number] Patient Account Number: [id number] Date of Birth/Sex: Treating RN: 1986/02/03 (35 y.o. [name] Primary Care Karigan Cloninger: [place], [place] Other Clinician: Referring Mandeep Ferch: Treating Jamisyn Langer/Extender: [name]: 23 Visit Information History Since Last Visit Added or deleted any medications: No Patient Arrived: Wheel Chair Any new allergies or adverse reactions: No Arrival Time: 08:10 Had a fall or experienced change in No Transfer Assistance: None activities of daily living that may affect Patient Identification Verified: Yes risk of falls: Secondary Verification Process Completed: Yes Signs or symptoms of abuse/neglect since last visito No Patient Requires Transmission-Based Precautions: No Hospitalized since last visit: No Patient Has Alerts: Yes Implantable device outside of the [name] excluding No Patient Alerts: R ABI = 1.0 cellular tissue based products placed in the center L ABI = 1.1 since last visit: Has Dressing in Place as Prescribed: Yes Has Compression in Place as Prescribed: Yes Pain Present Now: No Electronic Signature(s) Signed: 04/27/2021 5:38:29 PM By: Lorrin Jackson Entered By: Lorrin Jackson on 04/27/2021 08:14:59 -------------------------------------------------------------------------------- [name] Level of Care Assessment Details Patient Name: Date of Service: [name], [name] 04/27/2021 8:00 A M Medical Record Number: [id number] Patient Account Number: [id number] Date of Birth/Sex: Treating RN: December 01, 1985 (35 y.o. [name] Primary Care Anel Creighton: [name], [name] Other Clinician: Referring Avenly Roberge: Treating [name]/Extender: [name]: [name] Level of Care Assessment Items TOOL 4  Quantity Score X- 1 0 Use when only an EandM is performed on FOLLOW-UP visit ASSESSMENTS - Nursing Assessment / Reassessment X- 1 10 Reassessment of Co-morbidities (includes updates in patient status) X- 1 5 Reassessment of Adherence to Treatment Plan ASSESSMENTS - Wound and Skin A ssessment / Reassessment []  - 0 Simple Wound Assessment / Reassessment - one wound X- 3 5 Complex Wound Assessment / Reassessment - multiple wounds []  - 0 Dermatologic / Skin Assessment (not related to wound area) ASSESSMENTS - Focused Assessment []  - 0 Circumferential Edema Measurements - multi extremities []  - 0 Nutritional Assessment / Counseling / Intervention X- 1 5 Lower Extremity Assessment (monofilament, tuning fork, pulses) []  - 0 Peripheral Arterial Disease Assessment (using hand held doppler) ASSESSMENTS - Ostomy and/or Continence Assessment and Care []  - 0 Incontinence Assessment and Management []  - 0 Ostomy Care Assessment and Management (repouching, etc.) PROCESS - Coordination of Care X - Simple Patient / Family Education for ongoing care 1 15 []  - 0 Complex (extensive) Patient / Family Education for ongoing care X- 1 10 Staff obtains Programmer, systems, Records, T Results / Process Orders est []  - 0 Staff telephones HHA, Nursing Homes / Clarify orders / etc []  - 0 Routine Transfer to another Facility (non-emergent condition) []  - 0 Routine Hospital Admission (non-emergent condition) []  - 0 New Admissions / Biomedical engineer / Ordering NPWT Apligraf, etc. , []  - 0 Emergency Hospital Admission (emergent condition) X- 1 10 Simple Discharge Coordination []  - 0 Complex (extensive) Discharge Coordination PROCESS - Special Needs []  - 0 Pediatric / Minor Patient Management []  - 0 Isolation Patient Management []  - 0 Hearing / Language / Visual special needs []  - 0 Assessment of Community assistance (transportation, D/C planning, etc.) []  - 0 Additional assistance / Altered  mentation []  - 0 Support Surface(s) Assessment (bed, cushion, seat, etc.) INTERVENTIONS - Wound Cleansing / Measurement []  - 0 Simple Wound  Cleansing - one wound X- 3 5 Complex Wound Cleansing - multiple wounds X- 1 5 Wound Imaging (photographs - any number of wounds) []  - 0 Wound Tracing (instead of photographs) []  - 0 Simple Wound Measurement - one wound X- 3 5 Complex Wound Measurement - multiple wounds INTERVENTIONS - Wound Dressings X - Small Wound Dressing one or multiple wounds 3 10 []  - 0 Medium Wound Dressing one or multiple wounds []  - 0 Large Wound Dressing one or multiple wounds []  - 0 Application of Medications - topical []  - 0 Application of Medications - injection INTERVENTIONS - Miscellaneous []  - 0 External ear exam []  - 0 Specimen Collection (cultures, biopsies, blood, body fluids, etc.) []  - 0 Specimen(s) / Culture(s) sent or taken to Lab for analysis []  - 0 Patient Transfer (multiple staff / Civil Service fast streamer / Similar devices) []  - 0 Simple Staple / Suture removal (25 or less) []  - 0 Complex Staple / Suture removal (26 or more) []  - 0 Hypo / Hyperglycemic Management (close monitor of Blood Glucose) []  - 0 Ankle / Brachial Index (ABI) - do not check if billed separately X- 1 5 Vital Signs Has the patient been seen at the hospital within the last three years: Yes Total Score: 140 Level Of Care: New/Established - Level 4 Electronic Signature(s) Signed: 04/29/2021 5:57:05 PM By: Levan Hurst RN, BSN Entered By: Levan Hurst on 04/27/2021 10:06:37 -------------------------------------------------------------------------------- Encounter Discharge Information Details Patient Name: Date of Service: [name], [name] 04/27/2021 8:00 A M Medical Record Number: [id number] Patient Account Number: [id number] Date of Birth/Sex: Treating RN: 09-29-1986 (35 y.o. [name] Primary Care Darell Saputo: [place], [place] Other Clinician: Referring Dominik Lauricella: Treating  Temesgen Weightman/Extender: [name]: 277 Encounter Discharge Information Items Discharge Condition: Stable Ambulatory Status: Wheelchair Discharge Destination: Home Transportation: Private Auto Schedule Follow-up Appointment: Yes Clinical Summary of Care: Provided on 04/27/2021 Form Type Recipient Paper Patient Patient Electronic Signature(s) Signed: 04/27/2021 9:01:33 AM By: Lorrin Jackson Entered By: Lorrin Jackson on 04/27/2021 09:01:33 -------------------------------------------------------------------------------- Lower Extremity Assessment Details Patient Name: Date of Service: [name], [name] 04/27/2021 8:00 A M Medical Record Number: [id number] Patient Account Number: [id number] Date of Birth/Sex: Treating RN: 1986/03/03 (35 y.o. [name] Primary Care Tevis Dunavan: [place], [place] Other Clinician: Referring Faustino Luecke: Treating Kemaya Dorner/Extender: Dutch Gray, [name] [name]: 277 Edema Assessment Assessed: [Left: Yes] [Right: Yes] Edema: [Left: Yes] [Right: Yes] Calf Left: Right: Point of Measurement: 33 cm From Medial Instep 27.5 cm 33.2 cm Ankle Left: Right: Point of Measurement: 10 cm From Medial Instep 23 cm 23.2 cm Vascular Assessment Pulses: Dorsalis Pedis Palpable: [Left:Yes] [Right:Yes] Electronic Signature(s) Signed: 04/27/2021 5:38:29 PM By: Lorrin Jackson Entered By: Lorrin Jackson on 04/27/2021 08:17:48 -------------------------------------------------------------------------------- Multi Wound Chart Details Patient Name: Date of Service: [name], [name] 04/27/2021 8:00 A M Medical Record Number: [id number] Patient Account Number: [id number] Date of Birth/Sex: Treating RN: 22-Apr-1986 (35 y.o. [name] Primary Care Seila Liston: [name], [name] Other Clinician: Referring Reniya Mcclees: Treating Shameria Trimarco/Extender: [name]: 277 Vital Signs Height(in):  70 Pulse(bpm): 108 Weight(lbs): 216 Blood Pressure(mmHg): 109/73 Body Mass Index(BMI): 31 Temperature(F): 98.4 Respiratory Rate(breaths/min): 16 Photos: [41R:No Photos Left Ischium] [50:No Photos Right, Dorsal Ankle] [51:No Photos Left, Anterior Lower Leg] Wound Location: [41R:Gradually Appeared] [50:Gradually Appeared] [51:Trauma] Wounding Event: [41R:Pressure Ulcer] [50:Venous Leg Ulcer] [51:Trauma, Other] Primary Etiology: [41R:Sleep Apnea, Hypertension, Paraplegia Sleep Apnea, Hypertension, Paraplegia Sleep Apnea, Hypertension, Paraplegia] Comorbid History: [41R:03/16/2020] [50:01/12/2021] [51:01/26/2021] Date  Acquired: [41R:58] [50:15] [51:10] Weeks of Treatment: [41R:Open] [50:Healed - Epithelialized] [51:Open] Wound Status: [41R:Yes] [50:No] [51:No] Wound Recurrence: [41R:Yes] [50:No] [51:No] Clustered Wound: [41R:2] [50:N/A] [51:N/A] Clustered Quantity: [41R:5x3x0.2] [50:0x0x0] [51:1.2x0.8x0.1] Measurements L x W x D (cm) [41R:11.781] [50:0] [51:0.754] A (cm) : rea [41R:2.356] [50:0] [51:0.075] Volume (cm) : [41R:-552.30%] [50:100.00%] [51:-128.50%] % Reduction in A rea: [41R:-1201.70%] [50:100.00%] [51:-127.30%] % Reduction in Volume: [41R:Category/Stage II] [50:Full Thickness Without Exposed] [51:Full Thickness Without Exposed] Classification: [41R:Medium] [50:Support Structures N/A] [51:Support Structures Medium] Exudate Amount: [41R:Serosanguineous] [50:N/A] [51:Serosanguineous] Exudate Type: [41R:red, brown] [50:N/A] [51:red, brown] Exudate Color: [41R:Distinct, outline attached] [50:N/A] [51:Well defined, not attached] Wound Margin: [41R:Large (67-100%)] [50:N/A] [51:Large (67-100%)] Granulation Amount: [41R:Red, Pink, Friable] [50:N/A] [51:Red, Friable] Granulation Quality: [41R:Small (1-33%)] [50:N/A] [51:None Present (0%)] Necrotic Amount: [41R:Fat Layer (Subcutaneous Tissue): Yes N/A] [51:Fat Layer (Subcutaneous Tissue): Yes] Exposed Structures: [41R:Fascia: No  Tendon: No Muscle: No Joint: No Bone: No Small (1-33%)] [50:N/A] [51:Fascia: No Tendon: No Muscle: No Joint: No Bone: No Small (1-33%)] Wound Number: 52 N/A N/A Photos: No Photos N/A N/A Right, Dorsal Foot N/A N/A Wound Location: Gradually Appeared N/A N/A Wounding Event: Trauma, Other N/A N/A Primary Etiology: Sleep Apnea, Hypertension, Paraplegia N/A N/A Comorbid History: 03/27/2021 N/A N/A Date Acquired: 4 N/A N/A Weeks of Treatment: Open N/A N/A Wound Status: No N/A N/A Wound Recurrence: No N/A N/A Clustered Wound: N/A N/A N/A Clustered Quantity: 0.2x0.2x0.1 N/A N/A Measurements L x W x D (cm) 0.031 N/A N/A A (cm) : rea 0.003 N/A N/A Volume (cm) : 98.40% N/A N/A % Reduction in Area: 98.40% N/A N/A % Reduction in Volume: Full Thickness Without Exposed N/A N/A Classification: Support Structures Medium N/A N/A Exudate Amount: Sanguinous N/A N/A Exudate Type: red N/A N/A Exudate Color: Distinct, outline attached N/A N/A Wound Margin: Large (67-100%) N/A N/A Granulation Amount: Red N/A N/A Granulation Quality: None Present (0%) N/A N/A Necrotic Amount: Fat Layer (Subcutaneous Tissue): Yes N/A N/A Exposed Structures: Fascia: No Tendon: No Muscle: No Joint: No Bone: No None N/A N/A Epithelialization: Treatment Notes Wound #41R (Ischium) Wound Laterality: Left Cleanser Soap and Water Discharge Instruction: May shower and wash wound with dial antibacterial soap and water prior to dressing change. Peri-Wound Care Triamcinolone 15 (g) Discharge Instruction: Use triamcinolone 15 (g) mixed with zinc oxide Zinc Oxide Ointment 30g tube Discharge Instruction: Apply Zinc Oxide mixed with Triamcinolone to periwound with each dressing change Topical Primary Dressing Maxorb Extra Calcium Alginate Dressing, 4x4 in Discharge Instruction: Apply calcium alginate to wound bed as instructed Secondary Dressing ComfortFoam Border, 4x4 in (silicone border) Discharge  Instruction: Apply over primary dressing as directed. Secured With Compression Wrap Compression Stockings Add-Ons Wound #51 (Lower Leg) Wound Laterality: Left, Anterior Cleanser Soap and Water Discharge Instruction: May shower and wash wound with dial antibacterial soap and water prior to dressing change. Peri-Wound Care Topical Ketoconazole Cream 2% Discharge Instruction: Apply Ketoconazole to periwound. Pt to apply Lotrimin cream at home. Primary Dressing Hydrofera Blue Ready Foam, 4x5 in Discharge Instruction: Apply to wound bed as instructed Secondary Dressing ComfortFoam Border, 4x4 in (silicone border) Discharge Instruction: Apply over primary dressing as directed. Secured With Compression Wrap Compression Stockings Add-Ons Wound #52 (Foot) Wound Laterality: Dorsal, Right Cleanser Soap and Water Discharge Instruction: May shower and wash wound with dial antibacterial soap and water prior to dressing change. Peri-Wound Care Topical Primary Dressing Hydrofera Blue Ready Foam, 2.5 x2.5 in Discharge Instruction: Apply to wound bed as instructed Secondary Dressing Woven Gauze Sponge, Non-Sterile 4x4 in Discharge Instruction:  Apply over primary dressing as directed. Optifoam Non-Adhesive Dressing, 4x4 in Discharge Instruction: Apply foam to dorsal ankle for protection Secured With Kerlix Roll Sterile, 4.5x3.1 (in/yd) Discharge Instruction: Secure with Kerlix as directed. 36M Medipore H Soft Cloth Surgical T ape, 2x2 (in/yd) Discharge Instruction: Secure dressing with tape as directed. Compression Wrap Compression Stockings Add-Ons Electronic Signature(s) Signed: 04/27/2021 4:50:54 PM By: Linton Ham MD Signed: 04/29/2021 5:57:05 PM By: Levan Hurst RN, BSN Entered By: Linton Ham on 04/27/2021 09:09:27 -------------------------------------------------------------------------------- Multi-Disciplinary Care Plan Details Patient Name: Date of Service: Carillo, A  [name] 04/27/2021 8:00 A M Medical Record Number: [id number] Patient Account Number: [id number] Date of Birth/Sex: Treating RN: 1985/11/27 (35 y.o. [name] Primary Care Moksha Dorgan: [name], [name] Other Clinician: Referring Harish Bram: Treating Santia Labate/Extender: [name]: [place] reviewed with physician Active Inactive Wound/Skin Impairment Nursing Diagnoses: Impaired tissue integrity Knowledge deficit related to ulceration/compromised skin integrity Goals: Patient/caregiver will verbalize understanding of skin care regimen Date Initiated: 01/05/2016 Target Resolution Date: 05/08/2021 Goal Status: Active Ulcer/skin breakdown will have a volume reduction of 30% by week 4 Date Initiated: 01/05/2016 Date Inactivated: 12/22/2017 Target Resolution Date: 01/19/2018 Unmet Reason: complex wounds, Goal Status: Unmet infection Interventions: Assess patient/caregiver ability to obtain necessary supplies Assess ulceration(s) every visit Provide education on ulcer and skin care Notes: 02/02/21: Complex Care, ongoing. Electronic Signature(s) Signed: 04/29/2021 5:57:05 PM By: Levan Hurst RN, BSN Entered By: Levan Hurst on 04/27/2021 08:42:38 -------------------------------------------------------------------------------- Pain Assessment Details Patient Name: Date of Service: Pilley, [name] 04/27/2021 8:00 A M Medical Record Number: [id number] Patient Account Number: [id number] Date of Birth/Sex: Treating RN: 1986/04/06 (35 y.o. [name] Primary Care Onnika Siebel: [place], [place] Other Clinician: Referring Elisabet Gutzmer: Treating Lakoda Mcanany/Extender: [name]: 277 Active Problems Location of Pain Severity and Description of Pain Patient Has Paino No Site Locations Pain Management and Medication Current Pain Management: Electronic Signature(s) Signed: 04/27/2021 5:38:29 PM By:  Lorrin Jackson Entered By: Lorrin Jackson on 04/27/2021 08:15:49 -------------------------------------------------------------------------------- Patient/Caregiver Education Details Patient Name: Date of Service: Guillen, [name] 6/6/2022andnbsp8:00 A M Medical Record Number: [id number] Patient Account Number: [id number] Date of Birth/Gender: Treating RN: August 10, 1986 (35 y.o. [name] Primary Care Physician: Janine Limbo Other Clinician: Referring Physician: Treating Physician/Extender: [name]: [place] Education Assessment Education Provided To: Patient Education Topics Provided Wound/Skin Impairment: Methods: Explain/Verbal Responses: State content correctly Electronic Signature(s) Signed: 04/29/2021 5:57:05 PM By: Levan Hurst RN, BSN Entered By: Levan Hurst on 04/27/2021 08:43:02 -------------------------------------------------------------------------------- Wound Assessment Details Patient Name: Date of Service: [name], [name] 04/27/2021 8:00 A M Medical Record Number: [id number] Patient Account Number: [id number] Date of Birth/Sex: Treating RN: [birth year] (35 y.o. [name] Primary Care Charletta Voight: [place], [place] Other Clinician: Referring Davon Abdelaziz: Treating Tomoya Ringwald/Extender: [name]: 277 Wound Status Wound Number: 41R Primary Etiology: Pressure Ulcer Wound Location: Left Ischium Wound Status: Open Wounding Event: Gradually Appeared Comorbid History: Sleep Apnea, Hypertension, Paraplegia Date Acquired: 03/16/2020 Weeks Of Treatment: 58 Clustered Wound: Yes Wound Measurements Length: (cm) 5 Width: (cm) 3 Depth: (cm) 0.2 Clustered Quantity: 2 Area: (cm) 11.781 Volume: (cm) 2.356 % Reduction in Area: -552.3% % Reduction in Volume: -1201.7% Epithelialization: Small (1-33%) Tunneling: No Undermining: No Wound Description Classification: Category/Stage II Wound  Margin: Distinct, outline attached Exudate Amount: Medium Exudate Type: Serosanguineous Exudate Color: red, brown Foul Odor After Cleansing: No Slough/Fibrino Yes Wound Bed Granulation Amount: Large (67-100%) Exposed  Structure Granulation Quality: Red, Pink, Friable Fascia Exposed: No Necrotic Amount: Small (1-33%) Fat Layer (Subcutaneous Tissue) Exposed: Yes Necrotic Quality: Adherent Slough Tendon Exposed: No Muscle Exposed: No Joint Exposed: No Bone Exposed: No Treatment Notes Wound #41R (Ischium) Wound Laterality: Left Cleanser Soap and Water Discharge Instruction: May shower and wash wound with dial antibacterial soap and water prior to dressing change. Peri-Wound Care Triamcinolone 15 (g) Discharge Instruction: Use triamcinolone 15 (g) mixed with zinc oxide Zinc Oxide Ointment 30g tube Discharge Instruction: Apply Zinc Oxide mixed with Triamcinolone to periwound with each dressing change Topical Primary Dressing Maxorb Extra Calcium Alginate Dressing, 4x4 in Discharge Instruction: Apply calcium alginate to wound bed as instructed Secondary Dressing ComfortFoam Border, 4x4 in (silicone border) Discharge Instruction: Apply over primary dressing as directed. Secured With Compression Wrap Compression Stockings Environmental education officer) Signed: 04/27/2021 5:38:29 PM By: Lorrin Jackson Entered By: Lorrin Jackson on 04/27/2021 08:29:48 -------------------------------------------------------------------------------- Wound Assessment Details Patient Name: Date of Service: [name], [name] 04/27/2021 8:00 A M Medical Record Number: [id number] Patient Account Number: [id number] Date of Birth/Sex: Treating RN: 1986-04-21 (35 y.o. [name] Primary Care Valli Randol: [place], [place] Other Clinician: Referring Brigette Hopfer: Treating Quaneisha Hanisch/Extender: [name]: 277 Wound Status Wound Number: 50 Primary Etiology: Venous Leg  Ulcer Wound Location: Right, Dorsal Ankle Wound Status: Healed - Epithelialized Wounding Event: Gradually Appeared Comorbid History: Sleep Apnea, Hypertension, Paraplegia Date Acquired: 01/12/2021 Weeks Of Treatment: 15 Clustered Wound: No Wound Measurements Length: (cm) Width: (cm) Depth: (cm) Area: (cm) Volume: (cm) 0 % Reduction in Area: 100% 0 % Reduction in Volume: 100% 0 0 0 Wound Description Classification: Full Thickness Without Exposed Support Structur es Electronic Signature(s) Signed: 04/27/2021 5:38:29 PM By: Lorrin Jackson Signed: 04/27/2021 5:38:29 PM By: Lorrin Jackson Entered By: Lorrin Jackson on 04/27/2021 08:23:23 -------------------------------------------------------------------------------- Wound Assessment Details Patient Name: Date of Service: [name], [name] 04/27/2021 8:00 A M Medical Record Number: [id number] Patient Account Number: [id number] Date of Birth/Sex: Treating RN: 11-05-1986 (35 y.o. [name] Primary Care Zetta Stoneman: [name], [name] Other Clinician: Referring Macrae Wiegman: Treating Sharene Krikorian/Extender: [name]: 277 Wound Status Wound Number: 51 Primary Etiology: Trauma, Other Wound Location: Left, Anterior Lower Leg Wound Status: Open Wounding Event: Trauma Comorbid History: Sleep Apnea, Hypertension, Paraplegia Date Acquired: 01/26/2021 Weeks Of Treatment: 10 Clustered Wound: No Photos Wound Measurements Length: (cm) 1.2 Width: (cm) 0.8 Depth: (cm) 0.1 Area: (cm) 0.754 Volume: (cm) 0.075 % Reduction in Area: -128.5% % Reduction in Volume: -127.3% Epithelialization: Small (1-33%) Tunneling: No Undermining: No Wound Description Classification: Full Thickness Without Exposed Support Structures Wound Margin: Well defined, not attached Exudate Amount: Medium Exudate Type: Serosanguineous Exudate Color: red, brown Foul Odor After Cleansing: No Slough/Fibrino No Wound  Bed Granulation Amount: Large (67-100%) Exposed Structure Granulation Quality: Red, Friable Fascia Exposed: No Necrotic Amount: None Present (0%) Fat Layer (Subcutaneous Tissue) Exposed: Yes Tendon Exposed: No Muscle Exposed: No Joint Exposed: No Bone Exposed: No Treatment Notes Wound #51 (Lower Leg) Wound Laterality: Left, Anterior Cleanser Soap and Water Discharge Instruction: May shower and wash wound with dial antibacterial soap and water prior to dressing change. Peri-Wound Care Topical Ketoconazole Cream 2% Discharge Instruction: Apply Ketoconazole to periwound. Pt to apply Lotrimin cream at home. Primary Dressing Hydrofera Blue Ready Foam, 4x5 in Discharge Instruction: Apply to wound bed as instructed Secondary Dressing ComfortFoam Border, 4x4 in (silicone border) Discharge Instruction: Apply over primary dressing as directed. Secured With Compression Wrap Compression Stockings Add-Ons  Electronic Signature(s) Signed: 04/27/2021 5:38:29 PM By: Lorrin Jackson Signed: 04/28/2021 2:13:29 PM By: Sandre Kitty Entered By: Sandre Kitty on 04/27/2021 16:21:22 -------------------------------------------------------------------------------- Wound Assessment Details Patient Name: Date of Service: [name], [name] 04/27/2021 8:00 A M Medical Record Number: [id number] Patient Account Number: [id number] Date of Birth/Sex: Treating RN: February 14, 1986 (35 y.o. [name] Primary Care Azarria Balint: [name], [name] Other Clinician: Referring Jadalyn Oliveri: Treating Vandy Fong/Extender: [name]: 277 Wound Status Wound Number: 52 Primary Etiology: Trauma, Other Wound Location: Right, Dorsal Foot Wound Status: Open Wounding Event: Gradually Appeared Comorbid History: Sleep Apnea, Hypertension, Paraplegia Date Acquired: 03/27/2021 Weeks Of Treatment: 4 Clustered Wound: No Photos Wound Measurements Length: (cm) 0.2 Width: (cm) 0.2 Depth: (cm)  0.1 Area: (cm) 0.031 Volume: (cm) 0.003 % Reduction in Area: 98.4% % Reduction in Volume: 98.4% Epithelialization: None Tunneling: No Undermining: No Wound Description Classification: Full Thickness Without Exposed Support Structures Wound Margin: Distinct, outline attached Exudate Amount: Medium Exudate Type: Sanguinous Exudate Color: red Foul Odor After Cleansing: No Slough/Fibrino No Wound Bed Granulation Amount: Large (67-100%) Exposed Structure Granulation Quality: Red Fascia Exposed: No Necrotic Amount: None Present (0%) Fat Layer (Subcutaneous Tissue) Exposed: Yes Tendon Exposed: No Muscle Exposed: No Joint Exposed: No Bone Exposed: No Treatment Notes Wound #52 (Foot) Wound Laterality: Dorsal, Right Cleanser Soap and Water Discharge Instruction: May shower and wash wound with dial antibacterial soap and water prior to dressing change. Peri-Wound Care Topical Primary Dressing Hydrofera Blue Ready Foam, 2.5 x2.5 in Discharge Instruction: Apply to wound bed as instructed Secondary Dressing Woven Gauze Sponge, Non-Sterile 4x4 in Discharge Instruction: Apply over primary dressing as directed. Optifoam Non-Adhesive Dressing, 4x4 in Discharge Instruction: Apply foam to dorsal ankle for protection Secured With Kerlix Roll Sterile, 4.5x3.1 (in/yd) Discharge Instruction: Secure with Kerlix as directed. 10M Medipore H Soft Cloth Surgical T ape, 2x2 (in/yd) Discharge Instruction: Secure dressing with tape as directed. Compression Wrap Compression Stockings Add-Ons Electronic Signature(s) Signed: 04/27/2021 5:38:29 PM By: Lorrin Jackson Signed: 04/28/2021 2:13:29 PM By: Sandre Kitty Entered By: Sandre Kitty on 04/27/2021 16:22:07 -------------------------------------------------------------------------------- Vitals Details Patient Name: Date of Service: [name], [name] 04/27/2021 8:00 A M Medical Record Number: [id number] Patient Account Number: [id number] Date  of Birth/Sex: Treating RN: August 27, 1986 (35 y.o. [name] Primary Care [place]em[place] Huegel: [place], [place] Other Clinician: Referring Selby Foisy: Treating Nagee Goates/Extender: [name]: 277 Vital Signs Time Taken: 08:15 Temperature (F): 98.4 Height (in): 70 Pulse (bpm): 108 Weight (lbs): 216 Respiratory Rate (breaths/min): 16 Body Mass Index (BMI): 31 Blood Pressure (mmHg): 109/73 Reference Range: 80 - 120 mg / dl Electronic Signature(s) Signed: 04/27/2021 5:38:29 PM By: Lorrin Jackson Entered By: Lorrin Jackson on 04/27/2021 77:82:42

## 2021-04-29 NOTE — Progress Notes (Signed)
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 today after 1 week of Iodoflex. Significant cellulitis that we treated last week also better. [Doxycycline] 07/28/17 better surface today with now 2 weeks of Iodoflex.  Significant cellulitis treated with doxycycline. He has now completed the doxycycline and he is back to his usual degree of chronic venous inflammation/stasis dermatitis. He reminds me he has had ablations surgery here 08/04/17; continued improvement with Iodoflex to the left lateral leg wound in terms of the surface of the wound although the dimensions are better. 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 Problematic areas on the plantar aspect of the first second toe web space on the left and the dorsal aspect of the first second toe web space on the right. At one point I felt these were probably related to chronic fungal infections in treated him aggressively for this although we have not made any improvement here. 08/11/17; left lateral leg. Surface continues to improve with the Iodoflex although we are not seeing much improvement in overall wound dimensions. Areas on his plantar left foot and right foot show no improvement. In fact the right foot looks somewhat worse 08/18/17; left lateral leg. We changed to [facility] Blue last week after [name] prolonged course of Iodoflex which helps get the surface better. It appears that the wound with is improved. Continue with difficult areas on the left dorsal first second and plantar first second on the right  09/01/17; patient arrives in clinic today having had [name] temperature of 103 yesterday. He was seen in the ER and [facility]. The patient was concerned he could have cellulitis again in the right leg however they diagnosed him with [name] UTI and he is now on Keflex. He has [name] history of cellulitis which is been recurrent and difficult but this is been in the left leg, in the past 5 use doxycycline. He does in and out catheterizations at home which are risk factors for UTI 09/08/17; patient will be completing his Keflex this weekend. The erythema on the left leg is considerably better. He has [name] new  wound today on the medial part of the right leg small superficial almost looks like [name] skin tear. He has worsening of the area on the right dorsal first and second toe. His major area on the left lateral leg is better. Using Hydrofera Blue on all areas 09/15/17; gradual reduction in width on the long wound in the left lateral leg. No debridement required. He also has wounds on the plantar aspect of his left first second toe web space and on the dorsal aspect of the right first second toe web space. 09/22/17; there continues to be very gradual improvements in the dimensions of the left lateral leg wound. He hasn't round erythematous spot with might be pressure on his wheelchair. There is no evidence obviously of infection no purulence no warmth He has [name] dry scaled area on the plantar aspect of the left first second toe Improved area on the dorsal right first second toe. 09/29/17; left lateral leg wound continues to improve in dimensions mostly with an is still [name] fairly long but increasingly narrow wound. He has [name] dry scaled area on the plantar aspect of his left first second toe web space Increasingly concerning area on the dorsal right first second toe. In fact I am concerned today about possible cellulitis around this wound. The areas extending up his second toe and although there is deformities here almost appears to abut on the nailbed. 10/06/17; left lateral leg wound continues to make very gradual progress. Tissue culture I did from the right first second toe dorsal foot last time grew MRSA and enterococcus which was vancomycin sensitive. This was not sensitive to clindamycin or doxycycline. He is allergic to Zyvox and sulfa we have therefore arrange for him to have dalvance infusion tomorrow. He is had this in the past and tolerated it well 10/20/17; left lateral leg wound continues to make decent progress. This is certainly reduced in terms of with there is advancing epithelialization.The  cellulitis in the right foot looks better although he still has [name] deep wound in the dorsal aspect of the first second toe web space. Plantar left first toe web space on the left I think is making some progress 10/27/17; left lateral leg wound continues to make decent progress. Advancing epithelialization.using Hydrofera Blue The right first second toe web space wound is better-looking using silver alginate Improvement in the left plantar first second toe web space. Again using silver alginate 11/03/17 left lateral leg wound continues to make decent progress albeit slowly. Using [facility] The right per second toe web space continues to be [name] very problematic looking punched out wound. I obtained [name] piece of tissue for deep culture I did extensively treated this for fungus. It is difficult to imagine that this is [name] pressure area as the patient states other than going outside he doesn't really wear shoes at home The  left plantar first second toe web space looked fairly senescent. Necrotic edges. This required debridement change to [facility] Blue to all wound areas 11/10/17; left lateral leg wound continues to contract. Using Hydrofera Blue On the right dorsal first second toe web space dorsally. Culture I did of this area last week grew MRSA there is not an easy oral option in this patient was multiple antibiotic allergies or intolerances. This was only [name] rare culture isolate I'm therefore going to use Bactroban under silver alginate On the left plantar first second toe web space. Debridement is required here. This is also unchanged 11/17/17; left lateral leg wound continues to contract using Hydrofera Blue this is no longer the major issue. The major concern here is the right first second toe web space. He now has an open area going from dorsally to the plantar aspect. There is now wound on the inner lateral part of the first toe. Not [name] very viable surface on this. There is erythema spreading  medially into the forefoot. No major change in the left first second toe plantar wound 11/24/17; left lateral leg wound continues to contract using Hydrofera Blue. Nice improvement today The right first second toe web space all of this looks [name] lot less angry than last week. I have given him clindamycin and topical Bactroban for MRSA and terbinafine for the possibility of underlining tinea pedis that I could not control with ketoconazole. Looks somewhat better The area on the plantar left first second toe web space is weeping with dried debris around the wound 12/01/17; left lateral leg wound continues to contract he Hydrofera Blue. It is becoming thinner in terms of with nevertheless it is making good improvement. The right first second toe web space looks less angry but still [name] large necrotic-looking wounds starting on the plantar aspect of the right foot extending between the toes and now extensively on the base of the right second toe. I gave him clindamycin and topical Bactroban for MRSA anterior benefiting for the possibility of underlying tinea pedis. Not looking better today The area on the left first/second toe looks better. Debrided of necrotic debris 12/05/17* the patient was worked in urgently today because over the weekend he found blood on his incontinence bad when he woke up. He was found to have an ulcer by his wife who does most of his wound care. He came in today for [place] to look at this. He has not had [name] history of wounds in his buttocks in spite of his paraplegia. 12/08/17; seen in follow-up today at his usual appointment. He was seen earlier this week and found to have [name] new wound on his buttock. We also follow him for wounds on the left lateral leg, left first second toe web space and right first second toe web space 12/15/17; we have been using Hydrofera Blue to the left lateral leg which has improved. The right first second toe web space has also improved. Left first second toe web  space plantar aspect looks stable. The left buttock has worsened using Santyl. Apparently the buttock has drainage 12/22/17; we have been using Hydrofera Blue to the left lateral leg which continues to improve now 2 small wounds separated by normal skin. He tells [place] he had [name] fever up to 100 yesterday he is prone to UTIs but has not noted anything different. He does in and out catheterizations. The area between the first and second toes today does not look good necrotic surface covered with what looks  to be purulent drainage and erythema extending into the third toe. I had gotten this to something that I thought look better last time however it is not look good today. He also has [name] necrotic surface over the buttock wound which is expanded. I thought there might be infection under here so I removed [name] lot of the surface with [name] #5 curet though nothing look like it really needed culturing. He is been using Santyl to this area 12/27/17; his original wound on the left lateral leg continues to improve using Hydrofera Blue. I gave him samples of Baxdella although he was unable to take them out of fear for an allergic reaction ["lump in his throat"].the culture I did of the purulent drainage from his second toe last week showed both enterococcus and [name] set Enterobacter I was also concerned about the erythema on the bottom of his foot although paradoxically although this looks somewhat better today. 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 We've been using silver alginate here. They came up today that he sleeps in [name] recliner, I'm not sure why but I asked him to stop this 01/03/18; his original wound we've been using Hydrofera Blue is now separated into 2 areas. Ulcer on his left buttock is better he is off the recliner and sleeping in bed Finally both wound areas between his first and second toes also looks some  better 01/10/18; his original wound on the left lateral leg is now separated into 2 wounds we've been using Hydrofera Blue Ulcer on his left buttock has some drainage. There is [name] small probing site going into muscle layer superiorly.using silver alginate -He arrives today with [name] deep tissue injury on the left heel The wound on the dorsal aspect of his first second toe on the left looks [name] lot betterusing silver alginate ketoconazole The area on the first second toe web space on the right also looks [name] lot bette 01/17/18; his original wound on the left lateral leg continues to progress using Hydrofera Blue Ulcer on his left buttock also is smaller surface healthier except for [name] small probing site going into the muscle layer superiorly. 2.4 cm of tunneling in this area DTI on his left heel we have only been offloading. Looks better than last week no threatened open no evidence of infection the wound on the dorsal aspect of the first second toe on the left continues to look like it's regressing we have only been using silver alginate and terbinafine orally The area in the first second toe web space on the right also looks to be [name] lot better using silver alginate and terbinafine I think this was prompted by tinea pedis 01/31/18; the patient was hospitalized in [place] last week apparently for [name] complicated UTI. He was discharged on cefepime he does in and out catheterizations. In the hospital he was discovered M I don't mild elevation of AST and ALT and the terbinafine was stopped.predictably the pressure ulcer on s his buttock looks betterusing silver alginate. The area on the left lateral leg also is better using Hydrofera Blue. The area between the first and second toes on the left better. First and second toes on the right still substantial but better. Finally the DTI on the left heel has held together and looks like it's resolving 02/07/18-he is here in follow-up evaluation for multiple ulcerations.  He has new injury to the lateral aspect of  the last issue [name] pressure ulcer, he states this is from adhesive removal trauma. He states he has tried multiple adhesive products with no success. All other ulcers appear stable. The left heel DTI is resolving. We will continue with same treatment plan and follow-up next week. 02/14/18; follow-up for multiple areas. He has [name] new area last week on the lateral aspect of his pressure ulcer more over the posterior trochanter. The original pressure ulcer looks quite stable has healthy granulation. 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 Almost fully epithelialized on the original superior area using Hydrofera Blue DTI on the left heel has peeled off this week to reveal [name] small superficial wound under denuded skin and subcutaneous tissue Both areas between the first and second toes look better including nothing open on the left 02/21/18; The patient's wounds on his left ischial tuberosity and posterior left greater trochanter actually looked better. He has [name] large area of irritation around the area which I think is contact dermatitis. I am doubtful that this is fungal His original wound on the left lateral calf continues to improve we have been using Hydrofera Blue There is no open area in the left first second toe web space although there is [name] lot of thick callus The DTI on the left heel required debridement today of necrotic surface eschar and subcutaneous tissue using silver alginate Finally the area on the right first second toe webspace continues to contract using silver alginate and ketoconazole 02/28/18 Left ischial tuberosity wounds look better using silver alginate. Original wound on the left calf only has one small open area left using Hydrofera Blue DTI on the left heel required debridement mostly removing skin from around this wound surface. Using  silver alginate The areas on the right first/second toe web space using silver alginate and ketoconazole 03/08/18 on evaluation today patient appears to be doing decently well as best I can tell in regard to his wounds. This is the first time that I have seen him as he generally is followed by Dr. Dellia Nims. With that being said none of his wounds appear to be infected he does have an area where there is some skin covering what appears to be [name] new wound on the left dorsal surface of his great toe. This is right at the nail bed. With that being said I do believe that debrided away some of the excess skin can be of benefit in this regard. Otherwise he has been tolerating the dressing changes without complication. 03/14/18; patient arrives today with the multiplicity of wounds that we are following. He has not been systemically unwell Original wound on the left lateral calf now only has 2 small open areas we've been using Hydrofera Blue which should continue The deep tissue injury on the left heel requires debridement today. We've been using silver alginate The left first second toe and the right first second toe are both are reminiscence what I think was tinea pedis. Apparently some of the callus Surface between the toes was removed last week when it started draining. Purulent drainage coming from the wound on the ischial tuberosity on the left. 03/21/18-He is here in follow-up evaluation for multiple wounds. 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 He tolerated debridement. The only change to last week's recommendations is to discontinue antifungal cream between toes. He will follow-up next week  03/28/18; following up for multiple wounds;Concern this week is streaking redness and swelling in the right foot. He is going to need antibiotics for this. 03/31/18; follow-up for right foot cellulitis. Streaking redness and swelling in the  right foot on 03/28/18. He has multiple antibiotic intolerances and [name] history of MRSA. I put him on clindamycin 300 mg every 6 and brought him in for [name] quick check. He has an open wound between his first and second toes on the right foot as [name] potential source. 04/04/18; Right foot cellulitis is resolving he is completing clindamycin. This is truly good news Left lateral calf wound which is initial wound only has one small open area inferiorly this is close to healing out. He has compression stockings. We will use Hydrofera Blue right down to the epithelialization of this Nonviable surface on the left heel which was initially pressure with [name] DTI. We've been using Hydrofera Blue. I'm going to switch this back to silver alginate Left first second toe/tinea pedis this looks better using silver alginate Right first second toe tinea pedis using silver alginate Large pressure ulcers on theLeft ischial tuberosity. Small wound here Looks better. I am uncertain about the surface over the large wound. Using silver alginate 04/11/18; Cellulitis in the right foot is resolved Left lateral calf wound which was his original wounds still has 2 tiny open areas remaining this is just about closed Nonviable surface on the left heel is better but still requires debridement Left first second toe/tinea pedis still open using silver alginate Right first second toe wound tinea pedis I asked him to go back to using ketoconazole and silver alginate Large pressure ulcers on the left ischial tuberosity this shear injury here is resolved. Wound is smaller. No evidence of infection using silver alginate 04/18/18; Patient arrives with an intense area of cellulitis in the right mid lower calf extending into the right heel area. Bright red and warm. Smaller area on the left anterior leg. He has [name] significant history of MRSA. He will definitely need antibioticsdoxycycline He now has 2 open areas on the left ischial tuberosity the  original large wound and now [name] satellite area which I think was above his initial satellite areas. Not [name] wonderful surface on this satellite area surrounding erythema which looks like pressure related. His left lateral calf wound again his original wound is just about closed Left heel pressure injury still requiring debridement Left first second toe looks [name] lot better using silver alginate Right first second toe also using silver alginate and ketoconazole cream also looks better 04/20/18; the patient was worked in early today out of concerns with his cellulitis on the right leg. I had started him on doxycycline. This was 2 days ago. His wife was concerned about the swelling in the area. Also concerned about the left buttock. He has not been systemically unwell no fever chills. No nausea vomiting or diarrhea 04/25/18; the patient's left buttock wound is continued to deteriorate he is using Hydrofera Blue. He is still completing clindamycin for the cellulitis on the right leg although all of this looks better. 05/02/18 Left buttock wound still with [name] lot of drainage and [name] very tightly adherent fibrinous necrotic surface. He has [name] deeper area superiorly The left lateral calf wound is still closed DTI wound on the left heel necrotic surface especially the circumference using Iodoflex Areas between his left first second toe and right first second toe both look better. Dorsally and the right first second toe he  had [name] necrotic surface although at smaller. In using silver alginate and ketoconazole. I did [name] culture last week which was [name] deep tissue culture of the reminiscence of the open wound on the right first second toe dorsally. This grew [name] few Acinetobacter and [name] few methicillin-resistant staph aureus. Nevertheless the area actually this week looked better. I didn't feel the need to specifically address this at least in terms of systemic antibiotics. 05/09/18; wounds are measuring larger more drainage per  our intake. We are using Santyl covered with alginate on the large superficial buttock wounds, Iodosorb on the left heel, ketoconazole and silver alginate to the dorsal first and second toes bilaterally. 05/16/18; The area on his left buttock better in some aspects although the area superiorly over the ischial tuberosity required an extensive debridement.using Santyl Left heel appears stable. Using Iodoflex The areas between his first and second toes are not bad however there is spreading erythema up the dorsal aspect of his left foot this looks like cellulitis again. He is insensate the erythema is really very brilliant.o Erysipelas He went to see an allergist days ago because he was itching part of this he had lab work done. This showed [name] white count of 15.1 with 70% neutrophils. Hemoglobin of 11.4 and [name] platelet count of 659,000. Last white count we had in Epic was [name] 2-1/2 years ago which was 25.9 but he was ill at the time. He was able to show me some lab work that was done by his primary physician the pattern is about the same. 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 But prompted me to go ahead and do x-rays of both feet and the pelvis rule out osteomyelitis. He also had [name] comprehensive metabolic panel this was reasonably normal his albumin was 3.7 liver function tests BUN/creatinine all normal 05/23/18; x-rays of both his feet from last week were negative for underlying pulmonary abnormality. The x-ray of his pelvis however showed mild irregularity in the left ischial which may represent some early osteomyelitis. The wound in the left ischial continues to get deeper clearly now exposed muscle. Each week necrotic surface material over this area. Whereas the rest of the wounds do not look so bad. The left ischial wound we have been using Santyl and calcium alginate T the left heel surface necrotic debris using Iodoflex o The left lateral leg is still  healed Areas on the left dorsal foot and the right dorsal foot are about the same. There is some inflammation on the left which might represent contact dermatitis, fungal dermatitis I am doubtful cellulitis although this looks better than last week 05/30/18; CT scan done at Hospital did not show any osteomyelitis or abscess. Suggested the possibility of underlying cellulitis although I don't see [name] lot of evidence of this at the bedside The wound itself on the left buttock/upper thigh actually looks somewhat better. No debridement Left heel also looks better no debridement continue Iodoflex Both dorsal first second toe spaces appear better using Lotrisone. Left still required debridement 06/06/18; Intake reported some purulent looking drainage from the left gluteal wound. Using Santyl and calcium alginate Left heel looks better although still [name] nonviable surface requiring debridement The left dorsal foot first/second webspace actually expanding and somewhat deeper. I may consider doing [name] shave biopsy of this area Right dorsal foot first/second webspace appears stable to improved. Using Lotrisone and silver alginate to both these areas 06/13/18 Left gluteal surface looks  better. Now separated in the 2 wounds. No debridement required. Still drainage. We'll continue silver alginate Left heel continues to look better with Iodoflex continue this for at least another week Of his dorsal foot wounds the area on the left still has some depth although it looks better than last week. We've been using Lotrisone and silver alginate 06/20/18 Left gluteal continues to look better healthy tissue Left heel continues to look better healthy granulation wound is smaller. He is using Iodoflex and his long as this continues continue the Iodoflex Dorsal right foot looks better unfortunately dorsal left foot does not. There is swelling and erythema of his forefoot. He had minor trauma to this several days ago but doesn't  think this was enough to have caused any tissue injury. Foot looks like cellulitis, we have had this problem before 06/27/18 on evaluation today patient appears to be doing [name] little worse in regard to his foot ulcer. 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 Both of which would really be his only options for treating this infection. In the past he has been given and effusion of Orbactiv. This is done very well for him in the past again it's one time dosing IV antibiotic therapy. Subsequently I do believe this is something we're gonna need to see about doing at this point in time. Currently his other wounds seem to be doing somewhat better in my pinion I'm pretty happy in that regard. 07/03/18 on evaluation today patient's wounds actually appear to be doing fairly well. He has been tolerating the dressing changes without complication. All in all he seems to be showing signs of improvement. In regard to the antibiotics he has been dealing with infectious disease since I saw him last week as far as getting this scheduled. In the end [facility]'s [facility] this coming Friday. In the meantime he has been continuing to perform the dressing changes in such as previous. There does not appear to be any evidence of infection worsengin at this time. 07/10/18; Since I last saw this man 2 weeks ago things have actually improved. IV antibiotics of resulted in less forefoot erythema although there is still some present. He is not systemically unwell Left buttock wounds 2 now have no depth there is increased epithelialization Using silver alginate Left heel still requires debridement using Iodoflex Left dorsal foot still with [name] sizable wound about the size of [name] border but healthy granulation Right dorsal foot still with [name]  slitlike area using silver alginate 07/18/18; the patient's cellulitis in the left foot is improved in fact I think it is on its way to resolving. Left buttock wounds 2 both look better although the larger one has hypertension granulation we've been using silver alginate Left heel has some thick circumferential redundant skin over the wound edge which will need to be removed today we've been using Iodoflex Left dorsal foot is still [name] sizable wound required debridement using silver alginate The right dorsal foot is just about closed only [name] small open area remains here 07/25/18; left foot cellulitis is resolved Left buttock wounds 2 both look better. Hyper-granulation on the major area Left heel as some debris over the surface but otherwise looks [name] healthier wound. Using silver collagen Right dorsal foot is just about closed 07/31/18; arrives with  our intake nurse worried about purulent drainage from the buttock. We had hyper-granulation here last week His buttock wounds 2 continue to look better Left heel some debris over the surface but measuring smaller. Right dorsal foot unfortunately has openings between the toes Left foot superficial wound looks less aggravated. 08/07/18 Buttock wounds continue to look better although some of her granulation and the larger medial wound. silver alginate Left heel continues to look [name] lot better.silver collagen Left foot superficial wound looks less stable. Requires debridement. He has [name] new wound superficial area on the foot on the lateral dorsal foot. Right foot looks better using silver alginate without Lotrisone 08/14/2018; patient was in the ER last week diagnosed with [name] UTI. He is now on Cefpodoxime and Macrodantin. Buttock wounds continued to be smaller. Using silver alginate Left heel continues to look better using silver collagen Left foot superficial wound looks as though it is improving Right dorsal foot area is just about healed. 08/21/2018; patient is  completed his antibiotics for his UTI. He has 2 open areas on the buttocks. There is still not closed although the surface looks satisfactory. Using silver alginate Left heel continues to improve using silver collagen The bilateral dorsal foot areas which are at the base of his first and second toes/possible tinea pedis are actually stable on the left but worse on the right. The area on the left required debridement of necrotic surface. After debridement I obtained [name] specimen for PCR culture. The right dorsal foot which is been just about healed last week is now reopened 08/28/2018; culture done on the left dorsal foot showed coag negative staph both staph epidermidis and Lugdunensis. I think this is worthwhile initiating systemic treatment. I will use doxycycline given his long list of allergies. The area on the left heel slightly improved but still requiring debridement. The large wound on the buttock is just about closed whereas the smaller one is larger. Using silver alginate in this area 09/04/2018; patient is completing his doxycycline for the left foot although this continues to be [name] very difficult wound area with very adherent necrotic debris. We are using silver alginate to all his wounds right foot left foot and the small wounds on his buttock, silver collagen on the left heel. 09/11/2018; once again this patient has intense erythema and swelling of the left forefoot. Lesser degrees of erythema in the right foot. He has [name] long list of allergies and intolerances. I will reinstitute doxycycline. 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 Using silver alginate Left heel also looks better using silver collagen Unfortunately both the areas on his feet look worse. The area on the left first second webspace is now gone through to the plantar part of his foot. The area on the left foot anteriorly is irritated with erythema and swelling in the  forefoot. 09/25/2018 His wound on the left plantar heel looks better. Using silver collagen The area on the left buttock 2 small remnant areas. One is closed one is still open. Using silver alginate The areas between both his first and second toes look worse. This in spite of long-standing antifungal therapy with ketoconazole and silver alginate which should have antifungal activity He has small areas around his original wound on the left calf one is on the bottom of the original scar tissue and one superiorly both of these are small and superficial but again given wound history in this site this is  worrisome 10/02/2018 Left plantar heel continues to gradually contract using silver collagen Left buttock wound is unchanged using silver alginate The areas on his dorsal feet between his first and second toes bilaterally look about the same. I prescribed clindamycin ointment to see if we can address chronic staph colonization and also the underlying possibility of erythrasma The left lateral lower extremity wound is actually on the lateral part of his ankle. Small open area here. We have been using silver alginate 10/09/2018; Left plantar heel continues to look healthy and contract. No debridement is required Left buttock slightly smaller with [name] tape injury wound just below which was new this week Dorsal feet somewhat improved I have been using clindamycin Left lateral looks lower extremity the actual open area looks worse although [name] lot of this is epithelialized. I am going to change to silver collagen today He has [name] lot more swelling in the right leg although this is not pitting not red and not particularly warm there is [name] lot of spasm in the right leg usually indicative of people with paralysis of some underlying discomfort. We have reviewed his vascular status from 2017 he had [name] left greater saphenous vein ablation. I wonder about referring him back to vascular surgery if the area on the left  leg continues to deteriorate. 10/16/2018 in today for follow-up and management of multiple lower extremity ulcers. His left Buttock wound is much lower smaller and almost closed completely. The wound to the left ankle has began to reopen with Epithelialization and some adherent slough. He has multiple new areas to the left foot and leg. The left dorsal foot without much improvement. Wound present between left great webspace and 2nd toe. Erythema and edema present right leg. Right LE ultrasound obtained on 10/10/18 was negative for DVT . 10/23/2018; Left buttock is closed over. Still dry macerated skin but there is no open wound. I suspect this is chronic pressure/moisture Left lateral calf is quite [name] bit worse than when I saw this last. There is clearly drainage here he has macerated skin into the left plantar heel. We will change the primary dressing to alginate Left dorsal foot has some improvement in overall wound area. Still using clindamycin and silver alginate Right dorsal foot about the same as the left using clindamycin and silver alginate The erythema in the right leg has resolved. He is DVT rule out was negative Left heel pressure area required debridement although the wound is smaller and the surface is health 10/26/2018 The patient came back in for his nurse check today predominantly because of the drainage coming out of the left lateral leg with [name] recent reopening of his original wound on the left lateral calf. He comes in today with [name] large amount of surrounding erythema around the wound extending from the calf into the ankle and even in the area on the dorsal foot. He is not systemically unwell. He is not febrile. Nevertheless this looks like cellulitis. We have been using silver alginate to the area. I changed him to [name] regular visit and I am going to prescribe him doxycycline. The rationale here is [name] long list of medication intolerances and [name] history of MRSA. I did not see anything  that I thought would provide [name] valuable culture 10/30/2018 Follow-up from his appointment 4 days ago with really an extensive area of cellulitis in the left calf left lateral ankle and left dorsal foot. I put him on doxycycline. He has [name] long list of medication allergies  which are true allergy reactions. 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 In any case he is [name] lot better today. The erythema especially in the anterior and lateral left calf is better. He still has left ankle erythema. He also is complaining about increasing edema in the right leg we have only been using Kerlix Coban and he has been doing the wraps at home. Finally he has [name] spotty rash on the medial part of his upper left calf which looks like folliculitis or perhaps wrap occlusion type injury. Small superficial macules not pustules 11/06/18 patient arrives today with again [name] considerable degree of erythema around the wound on the left lateral calf extending into the dorsal ankle and dorsal foot. This is [name] lot worse than when I saw this last week. He is on doxycycline really with not [name] lot of improvement. He has not been systemically unwell Wounds on the; left heel actually looks improved. Original area on the left foot and proximity to the first and second toes looks about the same. He has superficial areas on the dorsal foot, anterior calf and then the reopening of his original wound on the left lateral calf which looks about the same The only area he has on the right is the dorsal webspace first and second which is smaller. He has [name] large area of dry erythematous skin on the left buttock small open area here. 11/13/2018; the patient arrives in much better condition. The erythema around the wound on the left lateral calf is [name] lot better. 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 In any case this is [name] lot better. The area on the left heel is very small and just about resolved using silver collagen we have been using silver alginate to the areas on his dorsal feet 11/20/2018; his wounds include the left lateral calf, left heel, dorsal aspects of both feet just proximal to the first second webspace. He is stable to slightly improved. I did not think any changes to his dressings were going to be necessary 11/27/2018 he has [name] reopening on the left buttock which is surrounded by what looks like tinea or perhaps some other form of dermatitis. The area on the left dorsal foot has some erythema around it I have marked this area but I am not sure whether this is cellulitis or not. Left heel is not closed. Left calf the reopening is really slightly longer and probably worse 1/13; in general things look better and smaller except for the left dorsal foot. Area on the left heel is just about closed, left buttock looks better only [name] small wound remains in the skin looks better [using Lotrisone] 1/20; the area on the left heel only has [name] few remaining open areas here. Left lateral calf about the same in terms of size, left dorsal foot slightly larger right lateral foot still not closed. The area on the left buttock has no open wound and the surrounding skin looks [name] lot better 1/27; the area on the left heel is closed. Left lateral calf better but still requiring extensive debridements. The area on his left buttock is closed. He still has the open areas on the left dorsal foot which is slightly smaller in the right foot which is slightly expanded. We have been using Iodoflex on these areas as well 2/3; left heel is  closed. Left lateral calf still requiring debridement using Iodoflex there is no open area on his left buttock however he has dry scaly skin over [name] large area of this. Not really responding well to the Lotrisone. Finally the areas on his dorsal feet at the level of the  first second webspace are slightly smaller on the right and about the same on the left. Both of these vigorously debrided with Anasept and gauze 2/10; left heel remains closed he has dry erythematous skin over the left buttock but there is no open wound here. Left lateral leg has come in and with. Still requiring debridement we have been using Iodoflex here. Finally the area on the left dorsal foot and right dorsal foot are really about the same extremely dry callused fissured areas. He does not yet have [name] dermatology appointment 2/17; left heel remains closed. He has [name] new open area on the left buttock. The area on the left lateral calf is bigger longer and still covered in necrotic debris. No major change in his foot areas bilaterally. I am awaiting for [name] dermatologist to look on this. We have been using ketoconazole I do not know that this is been doing any good at all. 2/24; left heel remains closed. The left buttock wound that was new reopening last week looks better. The left lateral calf appears better also although still requires debridement. The major area on his foot is the left first second also requiring debridement. We have been putting Prisma on all wounds. I do not believe that the ketoconazole has done too much good for his feet. He will use Lotrisone I am going to give him [name] 2-week course of terbinafine. We still do not have [name] dermatology appointment 3/2 left heel remains closed however there is skin over bone in this area I pointed this out to him today. The left buttock wound is epithelialized but still does not look completely stable. The area on the left leg required debridement were using silver collagen here. With regards to his feet we changed to Lotrisone last week and silver alginate. 3/9; left heel remains closed. Left buttock remains closed. The area on the right foot is essentially closed. The left foot remains unchanged. Slightly smaller on the left lateral calf. Using  silver collagen to both of these areas 3/16-Left heel remains closed. Area on right foot is closed. Left lateral calf above the lateral malleolus open wound requiring debridement with easy bleeding. Left dorsal wound proximal to first toe also debrided. Left ischial area open new. Patient has been using Prisma with wrapping every 3 days. Dermatology appointment is apparently tomorrow.Patient has completed his terbinafine 2-week course with some apparent improvement according to him, there is still flaking and dry skin in his foot on the left 3/23; area on the right foot is reopened. The area on the left anterior foot is about the same still [name] very necrotic adherent surface. He still has the area on the left leg and reopening is on the left buttock. He apparently saw dermatology although I do not have [name] note. According to the patient who is usually fairly well informed they did not have any good ideas. Put him on oral terbinafine which she is been on before. 3/30; using silver collagen to all wounds. Apparently his dermatologist put him on doxycycline and rifampin presumably some culture grew staph. I do not have this result. He remains on terbinafine although I have used terbinafine on him before 4/6; patient has had  [name] fairly substantial reopening on the right foot between the first and second toes. He is finished his terbinafine and I believe is on doxycycline and rifampin still as prescribed by dermatology. We have been using silver collagen to all his wounds although the patient reports that he thinks silver alginate does better on the wounds on his buttock. 4/13; the area on his left lateral calf about the same size but it did not require debridement. Left dorsal foot just proximal to the webspace between the first and second toes is about the same. Still nonviable surface. I note some superficial bronze discoloration of the dorsal part of his foot Right dorsal foot just proximal to the first  and second toes also looks about the same. I still think there may be the same discoloration I noted above on the left Left buttock wound looks about the same 4/20; left lateral calf appears to be gradually contracting using silver collagen. 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 The left dorsal foot wound is debrided of tightly adherent necrotic debris and really cleans up quite nicely. The right area is worse with expansion. I did not debride this it is now over the base of the second toe The area on his left buttock is smaller no debridement is required using silver collagen 5/4; left calf continues to make good progress. He arrives with erythema around the wounds on his dorsal foot which even extends to the plantar aspect. Very concerning for coexistent infection. He is finished the erythromycin I gave him for possible erythrasma this does not seem to have helped. The area on the left foot is about the same base of the dorsal toes Is area on the buttock looks improved on the left 5/11; left calf and left buttock continued to make good progress. Left foot is about the same to slightly improved. Major problem is on the right foot. He has not had an x-ray. Deep tissue culture I did last week showed both Enterobacter and E. coli. I did not change the doxycycline I put him on empirically although neither 1 of these were plated to doxycycline. He arrives today with the erythema looking worse on both the dorsal and plantar foot. Macerated skin on the bottom of the foot. he has not been systemically unwell 5/18-Patient returns at 1 week, left calf wound appears to be making some progress, left buttock wound appears slightly worse than last time, left foot wound looks slightly better, right foot redness is marginally better. X-ray of both feet show no air or evidence of osteomyelitis. Patient is finished his Omnicef and terbinafine. He  continues to have macerated skin on the bottom of the left foot as well as right 5/26; left calf wound is better, left buttock wound appears to have multiple small superficial open areas with surrounding macerated skin. X-rays that I did last time showed no evidence of osteomyelitis in either foot. He is finished cefdinir and doxycycline. I do not think that he was on terbinafine. He continues to have [name] large superficial open area on the right foot anterior dorsal and slightly between the first and second toes. I did send him to dermatology 2 months ago or so wondering about whether they would do [name] fungal scraping. I do not believe they did but did do [name] culture. We have been using silver alginate to the toe areas, he has been using antifungals at home topically either ketoconazole or Lotrisone. We are using silver  collagen on the left foot, silver alginate on the right, silver collagen on the left lateral leg and silver alginate on the left buttock 6/1; left buttock area is healed. We have the left dorsal foot, left lateral leg and right dorsal foot. We are using silver alginate to the areas on both feet and silver collagen to the area on his left lateral calf 6/8; the left buttock apparently reopened late last week. He is not really sure how this happened. He is tolerating the terbinafine. Using silver alginate to all wounds 6/15; left buttock wound is larger than last week but still superficial. Came in the clinic today with [name] report of purulence from the left lateral leg I did not identify any infection Both areas on his dorsal feet appear to be better. He is tolerating the terbinafine. Using silver alginate to all wounds 6/22; left buttock is about the same this week, left calf quite [name] bit better. His left foot is about the same however he comes in with erythema and warmth in the right forefoot once again. Culture that I gave him in the beginning of May showed Enterobacter and E. coli. I gave him  doxycycline and things seem to improve although neither 1 of these organisms was specifically plated. 6/29; left buttock is larger and dry this week. Left lateral calf looks to me to be improved. Left dorsal foot also somewhat improved right foot completely unchanged. The erythema on the right foot is still present. He is completing the Ceftin dinner that I gave him empirically [see discussion above.) 7/6 - All wounds look to be stable and perhaps improved, the left buttock wound is slightly smaller, per patient bleeds easily, completed ceftin, the right foot redness is less, he is on terbinafine 7/13; left buttock wound about the same perhaps slightly narrower. Area on the left lateral leg continues to narrow. Left dorsal foot slightly smaller right foot about the same. We are using silver alginate on the right foot and Hydrofera Blue to the areas on the left. Unna boot on the left 2 layer compression on the right 7/20; left buttock wound absolutely the same. Area on lateral leg continues to get better. Left dorsal foot require debridement as did the right no major change in the 7/27; left buttock wound the same size necrotic debris over the surface. The area on the lateral leg is closed once again. His left foot looks better right foot about the same although there is some involvement now of the posterior first second toe area. He is still on terbinafine which I have given him for [name] month, not certain [name] centimeter major change 06/25/19-All wounds appear to be slightly improved according to report, left buttock wound looks clean, both foot wounds have minimal to no debris the right dorsal foot has minimal slough. We are using Hydrofera Blue to the left and silver alginate to the right foot and ischial wound. 8/10-Wounds all appear to be around the same, the right forefoot distal part has some redness which was not there before, however the wound looks clean and small. Ischial wound looks about the  same with no changes 8/17; his wound on the left lateral calf which was his original chronic venous insufficiency wound remains closed. Since I last saw him the areas on the left dorsal foot right dorsal foot generally appear better but require debridement. The area on his left initial tuberosity appears somewhat larger to me perhaps hyper granulated and bleeds very easily. We have been  using Hydrofera Blue to the left dorsal foot and silver alginate to everything else 8/24; left lateral calf remains closed. The areas on his dorsal feet on the webspace of the first and second toes bilaterally both look better. The area on the left buttock which is the pressure ulcer stage II slightly smaller. I change the dressing to Hydrofera Blue to all areas 8/31; left lateral calf remains closed. The area on his dorsal feet bilaterally look better. Using Hydrofera Blue. Still requiring debridement on the left foot. No change in the left buttock pressure ulcers however 9/14; left lateral calf remains closed. Dorsal feet look quite [name] bit better than 2 weeks ago. Flaking dry skin also [name] lot better with the ammonium lactate I gave him 2 weeks ago. The area on the left buttock is improved. He states that his Roho cushion developed [name] leak and he is getting [name] new one, in the interim he is offloading this vigorously 9/21; left calf remains closed. Left heel which was [name] possible DTI looks better this week. He had macerated tissue around the left dorsal foot right foot looks satisfactory and improved left buttock wound. I changed his dressings to his feet to silver alginate bilaterally. Continuing Hydrofera Blue on the left buttock. 9/28 left calf remains closed. Left heel did not develop anything [possible DTI] dry flaking skin on the left dorsal foot. Right foot looks satisfactory. Improved left buttock wound. We are using silver alginate on his feet Hydrofera Blue on the buttock. I have asked him to go back to the  Lotrisone on his feet including the wounds and surrounding areas 10/5; left calf remains closed. The areas on the left and right feet about the same. [name] lot of this is epithelialized however debris over the remaining open areas. He is using Lotrisone and silver alginate. The area on the left buttock using Hydrofera Blue 10/26. Patient has been out for 3 weeks secondary to Covid concerns. He tested negative but I think his wife tested positive. He comes in today with the left foot substantially worse, right foot about the same. Even more concerning he states that the area on his left buttock closed over but then reopened and is considerably deeper in one aspect than it was before [stage III wound] 11/2; left foot really about the same as last week. Quarter sized wound on the dorsal foot just proximal to the first second toes. Surrounding erythema with areas of denuded epithelium. This is not really much different looking. Did not look like cellulitis this time however. Right foot area about the same.. We have been using silver alginate alginate on his toes Left buttock still substantial irritated skin around the wound which I think looks somewhat better. We have been using Hydrofera Blue here. 11/9; left foot larger than last week and [name] very necrotic surface. Right foot I think is about the same perhaps slightly smaller. Debris around the circumference also addressed. Unfortunately on the left buttock there is been [name] decline. Satellite lesions below the major wound distally and now [name] an additional one posteriorly we have been using Hydrofera Blue but I think this is [name] pressure issue 11/16; left foot ulcer dorsally again [name] very adherent necrotic surface. Right foot is about the same. Not much change in the pressure ulcer on his left buttock. 11/30; left foot ulcer dorsally basically the same as when I saw him 2 weeks ago. Very adherent fibrinous debris on the wound surface. Patient reports [name] lot  of  drainage as well. 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 We have been using Iodoflex, patient changed back to alginate because of the drainage. Area on his right dorsal foot really looks benign with [name] healthier surface certainly [name] lot better than on the left. Left buttock wounds all improved using Hydrofera Blue 12/7; left dorsal foot again no improvement. Tightly adherent debris. PCR culture I did last week only showed likely skin contaminant. I have gone ahead and done [name] punch biopsy of this which is about the last thing in terms of investigations I can think to do. He has known venous insufficiency and venous hypertension and this could be the issue here. The area on the right foot is about the same left buttock slightly worse according to our intake nurse secondary to [facility] Blue sticking to the wound 12/14; biopsy of the left foot that I did last time showed changes that could be related to wound healing/chronic stasis dermatitis phenomenon no neoplasm. We have been using silver alginate to both feet. I change the one on the left today to Sorbact and silver alginate to his other 2 wounds 12/28; the patient arrives with the following problems; Major issue is the dorsal left foot which continues to be [name] larger deeper wound area. Still with [name] completely nonviable surface Paradoxically the area mirror image on the right on the right dorsal foot appears to be getting better. He had some loss of dry denuded skin from the lower part of his original wound on the left lateral calf. Some of this area looked [name] little vulnerable and for this reason we put him in wrap that on this side this week The area on his left buttock is larger. He still has the erythematous circular area which I think is [name] combination of pressure, sweat. This does not look like cellulitis or fungal dermatitis 11/26/2019; -Dorsal left foot large open wound with depth. Still  debris over the surface. Using Sorbact The area on the dorsal right foot paradoxically has closed over He has [name] reopening on the left ankle laterally at the base of his original wound that extended up into the calf. This appears clean. The left buttock wound is smaller but with very adherent necrotic debris over the surface. We have been using silver alginate here as well The patient had arterial studies done in 2017. He had biphasic waveforms at the dorsalis pedis and posterior tibial bilaterally. ABI in the left was 1.17. Digit waveforms were dampened. He has slight spasticity in the great toes I do not think [name] TBI would be possible 1/11; the patient comes in today with [name] sizable reopening between the first and second toes on the right. This is not exactly in the same location where we have been treating wounds previously. According to our intake nurse this was actually fairly deep but 0.6 cm. The area on the left dorsal foot looks about the same the surface is somewhat cleaner using Sorbact, his MRI is in 2 days. We have not managed yet to get arterial studies. The new reopening on the left lateral calf looks somewhat better using alginate. The left buttock wound is about the same using alginate 1/18; the patient had his ARTERIAL studies which were quite normal. ABI in the right at 1.13 with triphasic/biphasic waveforms on the left ABI 1.06 again with triphasic/biphasic waveforms. It would not have been possible to have done [name] toe brachial index because of spasticity. We have  been using Sorbac to the left foot alginate to the rest of his wounds on the right foot left lateral calf and left buttock 1/25; arrives in clinic with erythema and swelling of the left forefoot worse over the first MTP area. This extends laterally dorsally and but also posteriorly. Still has an area on the left lateral part of the lower part of his calf wound it is eschared and clearly not closed. Area on the left buttock  still with surrounding irritation and erythema. Right foot surface wound dorsally. The area between the right and first and second toes appears better. 2/1; The left foot wound is about the same. Erythema slightly better I gave him [name] week of doxycycline empirically Right foot wound is more extensive extending between the toes to the plantar surface Left lateral calf really no open surface on the inferior part of his original wound however the entire area still looks vulnerable Absolutely no improvement in the left buttock wound required debridement. 2/8; the left foot is about the same. Erythema is slightly improved I gave him clindamycin last week. Right foot looks better he is using Lotrimin and silver alginate He has [name] breakdown in the left lateral calf. Denuded epithelium which I have removed Left buttock about the same were using Hydrofera Blue 2/15; left foot is about the same there is less surrounding erythema. Surface still has tightly adherent debris which I have debriding however not making any progress Right foot has [name] substantial wound on the medial right second toe between the first and second webspace. Still an open area on the left lateral calf distal area. Buttock wound is about the same 2/22; left foot is about the same less surrounding erythema. Surface has adherent debris. Polymen Ag Right foot area significant wound between the first and second toes. We have been using silver alginate here Left lateral leg polymen Ag at the base of his original venous insufficiency wound Left buttock some improvement here 3/1; Right foot is deteriorating in the first second toe webspace. Larger and more substantial. We have been using silver alginate. Left dorsal foot about the same markedly adherent surface debris using PolyMem Ag Left lateral calf surface debris using PolyMem AG Left buttock is improved again using PolyMem Ag. He is completing his terbinafine. The erythema in the foot  seems better. He has been on this for 2 weeks 3/8; no improvement in any wound area in fact he has [name] small open area on the dorsal midfoot which is new this week. He has not gotten his foot x-rays yet 3/15; his x-rays were both negative for osteomyelitis of both feet. No major change in any of his wounds on the extremities however his buttock wounds are better. We have been using polymen on the buttocks, left lower leg. Iodoflex on the left foot and silver alginate on the right 3/22; arrives in clinic today with the 2 major issues are the improvement in the left dorsal foot wound which for once actually looks healthy with [name] nice healthy wound surface without debridement. Using Iodoflex here. Unfortunately on the left lateral calf which is in the distal part of his original wound he came to the clinic here for there was purulent drainage noted some increased breakdown scattered around the original area and [name] small area proximally. We we are using polymen here will change to silver alginate today. His buttock wound on the left is better and I think the area on the right first second toe webspace is  also improved 3/29; left dorsal foot looks better. Using Iodoflex. Left ankle culture from deterioration last time grew E. coli, Enterobacter and Enterococcus. I will give him [name] course of cefdinir although that will not cover Enterococcus. The area on the right foot in the webspace of the first and second toe lateral first toe looks better. The area on his buttock is about healed Vascular appointment is on April 21. This is to look at his venous system vis--vis continued breakdown of the wounds on the left including the left lateral leg and left dorsal foot he. He has had previous ablations on this side 4/5; the area between the right first and second toes lateral aspect of the first toe looks better. Dorsal aspect of the left first toe on the left foot also improved. Unfortunately the left lateral lower leg  is larger and there is [name] second satellite wound superiorly. The usual superficial abrasions on the left buttock overall better but certainly not closed 4/12; the area between the right first and second toes is improved. Dorsal aspect of the left foot also slightly smaller with [name] vibrant healthy looking surface. No real change in the left lateral leg and the left buttock wound is healed He has an unaffordable co-pay for Apligraf. Appointment with vein and vascular with regards to the left leg venous part of the circulation is on 4/21 4/19; we continue to see improvement in all wound areas. Although this is minor. He has his vascular appointment on 4/21. The area on the left buttock has not reopened although right in the center of this area the skin looks somewhat threatened 4/26; the left buttock is unfortunately reopened. In general his left dorsal foot has [name] healthy surface and looks somewhat smaller although it was not measured as such. The area between his first and second toe webspace on the right as [name] small wound against the first toe. The patient saw vascular surgery. The real question I was asking was about the small saphenous vein on the left. He has previously ablated left greater saphenous vein. Nothing further was commented on on the left. Right greater saphenous vein without reflux at the saphenofemoral junction or proximal thigh there was no indication for ablation of the right greater saphenous vein duplex was negative for DVT bilaterally. They did not think there was anything from [name] point of view that could be offered. They ABIs within normal limits 5/3; only small open area on the left buttock. The area on the left lateral leg which was his original venous reflux is now 2 wounds both which look clean. We are using Iodoflex on the left dorsal foot which looks healthy and smaller. He is down to [name] very tiny area between the right first and second toes, using  silver alginate 5/10; all of his wounds appear better. We have much better edema control in 4 layer compression on the left. This may be the factor that is allowing the left foot and left lateral calf to heal. He has external compression garments at home 04/14/20-All of his wounds are progressing well, the left forefoot is practically closed, left ischium appears to be about the same, right toe webspace is also smaller. The left lateral leg is about the same, continue using Hydrofera Blue to this, silver alginate to the ischium, Iodoflex to the toe space on the right 6/7; most of his wounds outside of the left buttock are doing well. The area on the left lateral calf and left  dorsal foot are smaller. The area on the right foot in between the first and second toe webspace is barely visible although he still says there is some drainage here is the only reason I did not heal this out. Unfortunately the area on the left buttock almost looks like he has [name] skin tear from tape. He has open wound and then [name] large flap of skin that we are trying to get adherence over an area just next to the remaining wound 6/21; 2 week follow-up. I believe is been here for nurse visits. Miraculously the area between his first and second toes on the left dorsal foot is closed over. Still open on the right first second web space. The left lateral calf has 2 open areas. Distally this is more superficial. The proximal area had [name] little more depth and required debridement of adherent necrotic material. His buttock wound is actually larger we have been using silver alginate here 6/28; the patient's area on the left foot remains closed. Still open wet area between the first and second toes on the right and also extending into the plantar aspect. We have been using silver alginate in this location. He has 2 areas on the left lower leg part of his original long wounds which I think are better. We have been using Hydrofera Blue here.  Hydrofera Blue to the left buttock which is stable 7/12; left foot remains closed. Left ankle is closed. May be [name] small area between his right first and second toes the only truly open area is on the left buttock. We have been using Hydrofera Blue here 7/19; patient arrives with marked deterioration especially in the left foot and ankle. We did not put him in [name] compression wrap on the left last week in fact he wore his juxta lite stockings on either side although he does not have an underlying stocking. He has [name] reopening on the left dorsal foot, left lateral ankle and [name] new area on the right dorsal ankle. More worrisome is the degree of erythema on the left foot extending on the lateral foot into the lateral lower leg on the left 7/26; the patient had erythema and drainage from the lateral left ankle last week. Culture of this grew MRSA resistant to doxycycline and clindamycin which are the 2 antibiotics we usually use with this patient who has multiple antibiotic allergies including linezolid, trimethoprim sulfamethoxazole. I had give him an empiric doxycycline and he comes in the area certainly looks somewhat better although it is blotchy in his lower leg. He has not been systemically unwell. He has had areas on the left dorsal foot which is [name] reopening, chronic wounds on the left lateral ankle. Both of these I think are secondary to chronic venous insufficiency. The area between his first and second toes is closed as far as I can tell. He had [name] new wrap injury on the right dorsal ankle last week. Finally he has an area on the left buttock. We have been using silver alginate to everything except the left buttock we are using Hydrofera Blue 06/30/20-Patient returns at 1 week, has been given [name] sample dose pack of NUZYRA which is [name] tetracycline derivative [omadacycline], patient has completed those, we have been using silver alginate to almost all the wounds except the left ischium where we are using  Hydrofera Blue all of them look better 8/16; since I last saw the patient he has been doing well. The area on the left buttock, left lateral  ankle and left foot are all closed today. He has completed the [place] I gave him last time and tolerated this well. He still has open areas on the right dorsal ankle and in the right first second toe area which we are using silver alginate. 8/23; we put him in his bilateral external compression stockings last week as he did not have anything open on either leg except for concerning area between the right first and second toe. He comes in today with an area on the left dorsal foot slightly more proximal than the original wound, the left lateral foot but this is actually [name] continuation of the area he had on the left lateral ankle from last time. As well he is opened up on the left buttock again. 8/30; comes in today with things looking [name] lot better. The area on the left lower ankle has closed down as has the left foot but with eschar in both areas. The area on the dorsal right ankle is also epithelialized. Very little remaining of the left buttock wound. We have been using silver alginate on all wound areas 9/13; the area in the first second toe webspace on the right has fully epithelialized. He still has some vulnerable epithelium on the right and the ankle and the dorsal foot. He notes weeping. He is using his juxta lite stocking. On the left again the left dorsal foot is closed left lateral ankle is closed. We went to the juxta lite stocking here as well. Still vulnerable in the left buttock although only 2 small open areas remain here 9/27; 2-week follow-up. We did not look at his left leg but the patient says everything is closed. He is [name] bit disturbed by the amount of edema in his left foot he is using juxta lite stockings but asking about over the toes stockings which would be 30/40, will talk to him next time. According to him there is no open wound on  either the left foot or the left ankle/calf He has an open area on the dorsal right calf which I initially point [name] wrap injury. He has superficial remaining wound on the left ischial tuberosity been using silver alginate although he says this sticks to the wound 10/5; we gave him 2-week follow-up but he called yesterday expressing some concerns about his right foot right ankle and the left buttock. He came in early. There is still no open areas on the left leg and that still in his juxta lite stocking 10/11; he only has 1 small area on the left buttock that remains measuring millimeters 1 mm. Still has the same irritated skin in this area. We recommended zinc oxide when this eventually closes and pressure relief is meticulously is he can do this. He still has an area on the dorsal part of his right first through third toes which is [name] bit irritated and still open and on the dorsal ankle near the crease of the ankle. We have been using silver alginate and using his own stocking. He has nothing open on the left leg or foot 10/25; 2-week follow-up. Not nearly as good on the left buttock as I was hoping. For open areas with 5 looking threatened small. He has the erythematous irritated chronic skin in this area. 1 area on the right dorsal ankle. He reports this area bleeds easily Right dorsal foot just proximal to the base of his toes We have been using silver alginate. 11/8; 2-week follow-up. Left buttock is about the same  although I do not think the wounds are in the same location we have been using silver alginate. I have asked him to use zinc oxide on the skin around the wounds. He still has [name] small area on the right dorsal ankle he reports this bleeds easily Right dorsal foot just proximal to the base of the toes does not have anything open although the skin is very dry and scaly He has [name] new opening on the nailbed of the left great toe. Nothing on the left ankle 11/29; 3-week follow-up. Left  buttock has 2 open areas. And washing of these wounds today started bleeding easily. Suggesting very friable tissue. We have been using silver alginate. Right dorsal ankle which I thought was initially [name] wrap injury we have been using silver alginate. Nothing open between the toes that I can see. He states the area on the left dorsal toe nailbed healed after the last visit in 2 or 3 days 12/13; 3-week follow-up. His left buttock now has 3 open areas but the original 2 areas are smaller using polymen here. Surrounding skin looks better. The right dorsal ankle is closed. He has [name] small opening on the right dorsal foot at the level of the third toe. In general the skin looks better here. He is wearing his juxta lite stocking on the left leg says there is nothing open 11/24/2020; 3 weeks follow-up. His left buttock still has the 3 open areas. We have been using polymen but due to lack of response he changed to [facility] area. Surrounding skin is dry erythematous and irritated looking. There is no evidence of infection either bacterial or fungal however there is loss of surface epithelium He still has very dry skin in his foot causing irritation and erythema on the dorsal part of his toes. This is not responded to prolonged courses of antifungal simply looks dry and irritated 1/24; left buttock area still looks about the same he was unable to find the triad ointment that we had suggested. The area on the right lower leg just above the dorsal ankle has reopened and the areas on the right foot between the first second and second third toes and scaling on the bottom of the foot has been about the same for quite some time now. been using silver alginate to all wound areas 2/7; left buttock wound looked quite good although not much smaller in terms of surface area surrounding skin looks better. Only [name] few dry flaking areas on the right foot in between the first and second toes the skin generally looks better  here [ammonium lactate]. Finally the area on the right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 measurement still very difficult to debridement adherent slough and nonviable subcutaneous tissue. This is not really been helped by several weeks of Santyl. Previously for 2 weeks I used Iodoflex for [name] short period. [name] prolonged course of Hydrofera Blue didn't really help. I'm not sure why I only used 2 weeks of Iodoflex on this there is no evidence of surrounding infection. He has [name] small area on the right second toe which looks as though it's progressing towards closure 10/28/16; the wounds on his toes appear to be closed. No major change in the left lateral leg wound although the surface looks somewhat better using Iodoflex. He has had previous arterial studies that were normal. He has had reflux studies and is status post ablation although I don't have any exact notes on which vein was ablated. I'll need to check the surgical record 11/04/16; he's had [name] reopening between the first and second toe on the left and right. No major change in the left lateral leg wound. There is what appears to be cellulitis of the left dorsal foot 11/18/16 the patient was hospitalized initially in [place] and then subsequently transferred to [facility] long and was admitted there from 11/09/16 through 11/12/16. He had developed progressive cellulitis on the right leg in spite of the doxycycline I gave him. I'd spoken to the hospitalist in [place] who was concerned about continuing leukocytosis. CT scan is what I suggested this was done which showed soft tissue swelling without evidence of osteomyelitis or an underlying abscess blood cultures were negative. At [facility] he was treated with vancomycin and Primaxin and then add an infectious disease consult. He was transitioned to Ceftaroline. He has been making  progressive improvement. Overall [name] severe cellulitis of the right leg. He is been using silver alginate to her original wound on the left leg. The wounds in his toes on the right are closed there is [name] small open area on the base of the left second toe 11/26/15; the patient's right leg is much better although there is still some edema here this could be reminiscent from his severe cellulitis likely on top of some degree of lymphedema. His left anterior leg wound has less surface slough as reported by her intake nurse. Small wound at the base of the left second toe 12/02/16; patient's right leg is better and there is no open wound here. His left anterior lateral leg wound continues to have [name] healthy-looking surface. Small wound at the base of the left second toe however there is erythema in the left forefoot which is worrisome 12/16/16; is no open wounds on his right leg. We took measurements for stockings. His left anterior lateral leg wound continues to have [name] healthy-looking surface. I'm not sure where we were with the Apligraf run through his insurance. We have been using Iodoflex. He has [name] thick eschar on the left first second toe interface, I suspect this may be fungal however there is no visible open 12/23/16; no open wound on his right leg. He has 2 small areas left of the linear wound that was remaining last week. We have been using [facility], I thought I have disclosed this week, we can only look forward  to next week 01/03/17; the patient had concerning areas of erythema last week, already on doxycycline for UTI through his primary doctor. The erythema is absolutely no better there is warmth and swelling both medially from the left lateral leg wound and also the dorsal left foot. 01/06/17- Patient is here for follow-up evaluation of his left lateral leg ulcer and bilateral feet ulcers. He is on oral antibiotic therapy, tolerating that. Nursing staff and the patient states that the erythema is improved  from Monday. 01/13/17; the predominant left lateral leg wound continues to be problematic. I had put Apligraf on him earlier this month once. However he subsequently developed what appeared to be an intense cellulitis around the left lateral leg wound. I gave him Dalvance I think on 2/12 perhaps 2/13 he continues on cefdinir. The erythema is still present but the warmth and swelling is improved. I am hopeful that the cellulitis part of this control. I wouldn't be surprised if there is an element of venous inflammation as well. 01/17/17. The erythema is present but better in the left leg. His left lateral leg wound still does not have [name] viable surface buttons certain parts of this long thin wound it appears like there has been improvement in dimensions. 01/20/17; the erythema still present but much better in the left leg. I'm thinking this is his usual degree of chronic venous inflammation. The wound on the left leg looks somewhat better. Is less surface slough 01/27/17; erythema is back to the chronic venous inflammation. The wound on the left leg is somewhat better. I am back to the point where I like to try an Apligraf once again 02/10/17; slight improvement in wound dimensions. Apligraf #2. He is completing his doxycycline 02/14/17; patient arrives today having completed doxycycline last Thursday. This was supposed to be [name] nurse visit however once again he hasn't tense erythema from the medial part of his wound extending over the lower leg. Also erythema in his foot this is roughly in the same distribution as last time. He has baseline chronic venous inflammation however this is [name] lot worse than the baseline I have learned to accept the on him is baseline inflammation 02/24/17- patient is here for follow-up evaluation. He is tolerating compression therapy. His voicing no complaints or concerns he is here anticipating an Apligraf 03/03/17; he arrives today with an adherent necrotic surface. I don't think this  is surface is going to be amenable for Apligraf's. The erythema around his wound and on the left dorsal foot has resolved he is off antibiotics 03/10/17; better-looking surface today. I don't think he can tolerate Apligraf's. He tells me he had [name] wound VAC after [name] skin graft years ago to this area and they had difficulty with [name] seal. The erythema continues to be stable around this some degree of chronic venous inflammation but he also has recurrent cellulitis. We have been using Iodoflex 03/17/17; continued improvement in the surface and may be small changes in dimensions. Using Iodoflex which seems the only thing that will control his surface 03/24/17- He is here for follow up evaluation of his LLE lateral ulceration and ulcer to right dorsal foot/toe space. He is voicing no complaints or concerns, He is tolerating compression wrap. 03/31/17 arrives today with [name] much healthier looking wound on the left lower extremity. We have been using Iodoflex for [name] prolonged period of time which has for the first time prepared and adequate looking wound bed although we have not had much in the way  of wound dimension improvement. He also has [name] small wound between the first and second toe on the right 04/07/17; arrives today with [name] healthy-looking wound bed and at least the top 50% of this wound appears to be now her. No debridement was required I have changed him to [facility] last week after prolonged Iodoflex. He did not do well with Apligraf's. We've had [name] re-opening between the first and second toe on the right 04/14/17; arrives today with [name] healthier looking wound bed contractions and the top 50% of this wound and some on the lesser 50%. Wound bed appears healthy. The area between the first and second toe on the right still remains problematic 04/21/17; continued very gradual improvement. Using [facility] 04/28/17; continued very gradual improvement in the left lateral leg venous insufficiency wound. His  periwound erythema is very mild. We have been using Hydrofera Blue. Wound is making progress especially in the superior 50% 05/05/17; he continues to have very gradual improvement in the left lateral venous insufficiency wound. Both in terms with an length rings are improving. I debrided this every 2 weeks with #5 curet and we have been using Hydrofera Blue and again making good progress With regards to the wounds between his right first and second toe which I thought might of been tinea pedis he is not making as much progress very dry scaly skin over the area. Also the area at the base of the left first and second toe in [name] similar condition 05/12/17; continued gradual improvement in the refractory left lateral venous insufficiency wound on the left. Dimension smaller. Surface still requiring debridement using Hydrofera Blue 05/19/17; continued gradual improvement in the refractory left lateral venous ulceration. Careful inspection of the wound bed underlying rumination suggested some degree of epithelialization over the surface no debridement indicated. Continue Hydrofera Blue difficult areas between his toes first and third on the left than first and second on the right. I'm going to change to silver alginate from silver collagen. Continue ketoconazole as I suspect underlying tinea pedis 05/26/17; left lateral leg venous insufficiency wound. We've been using Hydrofera Blue. I believe that there is expanding epithelialization over the surface of the wound albeit not coming from the wound circumference. This is [name] bit of an odd situation in which the epithelialization seems to be coming from the surface of the wound rather than in the exact circumference. There is still small open areas mostly along the lateral margin of the wound. ooHe has unchanged areas between the left first and second and the right first second toes which I been treating for tenia pedis 06/02/17; left lateral leg venous insufficiency  wound. We have been using Hydrofera Blue. Somewhat smaller from the wound circumference. The surface of the wound remains [name] bit on it almost epithelialized sedation in appearance. I use an open curette today debridement in the surface of all of this especially the edges ooSmall open wounds remaining on the dorsal right first and second toe interspace and the plantar left first second toe and her face on the left 06/09/17; wound on the left lateral leg continues to be smaller but very gradual and very dry surface using Hydrofera Blue 06/16/17 requires weekly debridements now on the left lateral leg although this continues to contract. I changed to silver collagen last week because of dryness of the wound bed. Using Iodoflex to the areas on his first and second toes/web space bilaterally 06/24/17; patient with history of paraplegia also chronic venous insufficiency with lymphedema.  Has [name] very difficult wound on the left lateral leg. This has been gradually reducing in terms of with but comes in with [name] very dry adherent surface. High switch to silver collagen [name] week or so ago with hydrogel to keep the area moist. This is been refractory to multiple dressing attempts. He also has areas in his first and second toes bilaterally in the anterior and posterior web space. I had been using Iodoflex here after [name] prolonged course of silver alginate with ketoconazole was ineffective [question tinea pedis] 07/14/17; patient arrives today with [name] very difficult adherent material over his left lateral lower leg wound. He also has surrounding erythema and poorly controlled edema. He was switched his Santyl last visit which the nurses are applying once during his doctor visit and once on [name] nurse visit. He was also reduced to 2 layer compression I'm not exactly sure of the issue here. 07/21/17; better surface today after 1 week of Iodoflex. Significant cellulitis that we treated last week also better. [Doxycycline] 07/28/17  better surface today with now 2 weeks of Iodoflex. Significant cellulitis treated with doxycycline. He has now completed the doxycycline and he is back to his usual degree of chronic venous inflammation/stasis dermatitis. He reminds me he has had ablations surgery here 08/04/17; continued improvement with Iodoflex to the left lateral leg wound in terms of the surface of the wound although the dimensions are better. 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 Problematic areas on the plantar aspect of the first second toe web space on the left and the dorsal aspect of the first second toe web space on the right. At one point I felt these were probably related to chronic fungal infections in treated him aggressively for this although we have not made any improvement here. 08/11/17; left lateral leg. Surface continues to improve with the Iodoflex although we are not seeing much improvement in overall wound dimensions. Areas on his plantar left foot and right foot show no improvement. In fact the right foot looks somewhat worse 08/18/17; left lateral leg. We changed to [facility] Blue last week after [name] prolonged course of Iodoflex which helps get the surface better. It appears that the wound with is improved. Continue with difficult areas on the left dorsal first second and plantar first second on the right 09/01/17; patient arrives in clinic today having had [name] temperature of 103 yesterday. He was seen in the ER and [facility]. The patient was concerned he could have cellulitis again in the right leg however they diagnosed him with [name] UTI and he is now on Keflex. He has [name] history of cellulitis which is been recurrent and difficult but this is been in the left leg, in the past 5 use doxycycline. He does in and out catheterizations at home which are risk factors for UTI 09/08/17; patient will be completing his Keflex this weekend. The erythema on  the left leg is considerably better. He has [name] new wound today on the medial part of the right leg small superficial almost looks like [name] skin tear. He has worsening of the area on the right dorsal first and second toe. His major area on the left lateral leg is better. Using Hydrofera Blue on all areas 09/15/17; gradual reduction in width on the long wound in the left lateral leg. No debridement required. He also has wounds on the plantar aspect of his left first second toe web space and on  the dorsal aspect of the right first second toe web space. 09/22/17; there continues to be very gradual improvements in the dimensions of the left lateral leg wound. He hasn't round erythematous spot with might be pressure on his wheelchair. There is no evidence obviously of infection no purulence no warmth ooHe has [name] dry scaled area on the plantar aspect of the left first second toe ooImproved area on the dorsal right first second toe. 09/29/17; left lateral leg wound continues to improve in dimensions mostly with an is still [name] fairly long but increasingly narrow wound. ooHe has [name] dry scaled area on the plantar aspect of his left first second toe web space ooIncreasingly concerning area on the dorsal right first second toe. In fact I am concerned today about possible cellulitis around this wound. The areas extending up his second toe and although there is deformities here almost appears to abut on the nailbed. 10/06/17; left lateral leg wound continues to make very gradual progress. Tissue culture I did from the right first second toe dorsal foot last time grew MRSA and enterococcus which was vancomycin sensitive. This was not sensitive to clindamycin or doxycycline. He is allergic to Zyvox and sulfa we have therefore arrange for him to have dalvance infusion tomorrow. He is had this in the past and tolerated it well 10/20/17; left lateral leg wound continues to make decent progress. This is certainly reduced in  terms of with there is advancing epithelialization.ooThe cellulitis in the right foot looks better although he still has [name] deep wound in the dorsal aspect of the first second toe web space. Plantar left first toe web space on the left I think is making some progress 10/27/17; left lateral leg wound continues to make decent progress. Advancing epithelialization.using Hydrofera Blue ooThe right first second toe web space wound is better-looking using silver alginate ooImprovement in the left plantar first second toe web space. Again using silver alginate 11/03/17 left lateral leg wound continues to make decent progress albeit slowly. Using Hydrofera Blue ooThe right per second toe web space continues to be [name] very problematic looking punched out wound. I obtained [name] piece of tissue for deep culture I did extensively treated this for fungus. It is difficult to imagine that this is [name] pressure area as the patient states other than going outside he doesn't really wear shoes at home ooThe left plantar first second toe web space looked fairly senescent. Necrotic edges. This required debridement oochange to Hydrofera Blue to all wound areas 11/10/17; left lateral leg wound continues to contract. Using Hydrofera Blue ooOn the right dorsal first second toe web space dorsally. Culture I did of this area last week grew MRSA there is not an easy oral option in this patient was multiple antibiotic allergies or intolerances. This was only [name] rare culture isolate I'm therefore going to use Bactroban under silver alginate ooOn the left plantar first second toe web space. Debridement is required here. This is also unchanged 11/17/17; left lateral leg wound continues to contract using Hydrofera Blue this is no longer the major issue. ooThe major concern here is the right first second toe web space. He now has an open area going from dorsally to the plantar aspect. There is now wound on the inner lateral part of  the first toe. Not [name] very viable surface on this. There is erythema spreading medially into the forefoot. ooNo major change in the left first second toe plantar wound 11/24/17; left lateral leg wound continues to  contract using [employer]. Nice improvement today ooThe right first second toe web space all of this looks [name] lot less angry than last week. I have given him clindamycin and topical Bactroban for MRSA and terbinafine for the possibility of underlining tinea pedis that I could not control with ketoconazole. Looks somewhat better ooThe area on the plantar left first second toe web space is weeping with dried debris around the wound 12/01/17; left lateral leg wound continues to contract he Hydrofera Blue. It is becoming thinner in terms of with nevertheless it is making good improvement. ooThe right first second toe web space looks less angry but still [name] large necrotic-looking wounds starting on the plantar aspect of the right foot extending between the toes and now extensively on the base of the right second toe. I gave him clindamycin and topical Bactroban for MRSA anterior benefiting for the possibility of underlying tinea pedis. Not looking better today ooThe area on the left first/second toe looks better. Debrided of necrotic debris 12/05/17* the patient was worked in urgently today because over the weekend he found blood on his incontinence bad when he woke up. He was found to have an ulcer by his wife who does most of his wound care. He came in today for [place] to look at this. He has not had [name] history of wounds in his buttocks in spite of his paraplegia. 12/08/17; seen in follow-up today at his usual appointment. He was seen earlier this week and found to have [name] new wound on his buttock. We also follow him for wounds on the left lateral leg, left first second toe web space and right first second toe web space 12/15/17; we have been using Hydrofera Blue to the left lateral leg which has  improved. The right first second toe web space has also improved. Left first second toe web space plantar aspect looks stable. The left buttock has worsened using Santyl. Apparently the buttock has drainage 12/22/17; we have been using Hydrofera Blue to the left lateral leg which continues to improve now 2 small wounds separated by normal skin. He tells [place] he had [name] fever up to 100 yesterday he is prone to UTIs but has not noted anything different. He does in and out catheterizations. The area between the first and second toes today does not look good necrotic surface covered with what looks to be purulent drainage and erythema extending into the third toe. I had gotten this to something that I thought look better last time however it is not look good today. He also has [name] necrotic surface over the buttock wound which is expanded. I thought there might be infection under here so I removed [name] lot of the surface with [name] #5 curet though nothing look like it really needed culturing. He is been using Santyl to this area 12/27/17; his original wound on the left lateral leg continues to improve using Hydrofera Blue. I gave him samples of Baxdella although he was unable to take them out of fear for an allergic reaction ["lump in his throat"].the culture I did of the purulent drainage from his second toe last week showed both enterococcus and [name] set Enterobacter I was also concerned about the erythema on the bottom of his foot although paradoxically although this looks somewhat better today. 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 We've been using silver alginate here. They came up today that  he sleeps in [name] recliner, I'm not sure why but I asked him to stop this 01/03/18; his original wound we've been using Hydrofera Blue is now separated into 2 areas. ooUlcer on his left buttock is better he is off the recliner and  sleeping in bed ooFinally both wound areas between his first and second toes also looks some better 01/10/18; his original wound on the left lateral leg is now separated into 2 wounds we've been using Hydrofera Blue ooUlcer on his left buttock has some drainage. There is [name] small probing site going into muscle layer superiorly.using silver alginate -He arrives today with [name] deep tissue injury on the left heel ooThe wound on the dorsal aspect of his first second toe on the left looks [name] lot betterusing silver alginate ketoconazole ooThe area on the first second toe web space on the right also looks [name] lot bette 01/17/18; his original wound on the left lateral leg continues to progress using Hydrofera Blue ooUlcer on his left buttock also is smaller surface healthier except for [name] small probing site going into the muscle layer superiorly. 2.4 cm of tunneling in this area ooDTI on his left heel we have only been offloading. Looks better than last week no threatened open no evidence of infection oothe wound on the dorsal aspect of the first second toe on the left continues to look like it's regressing we have only been using silver alginate and terbinafine orally ooThe area in the first second toe web space on the right also looks to be [name] lot better using silver alginate and terbinafine I think this was prompted by tinea pedis 01/31/18; the patient was hospitalized in [place] last week apparently for [name] complicated UTI. He was discharged on cefepime he does in and out catheterizations. In the hospital he was discovered M I don't mild elevation of AST and ALT and the terbinafine was stopped.predictably the pressure ulcer on s his buttock looks betterusing silver alginate. The area on the left lateral leg also is better using Hydrofera Blue. The area between the first and second toes on the left better. First and second toes on the right still substantial but better. Finally the DTI on the left heel has  held together and looks like it's resolving 02/07/18-he is here in follow-up evaluation for multiple ulcerations. He has new injury to the lateral aspect of the last issue [name] pressure ulcer, he states this is from adhesive removal trauma. He states he has tried multiple adhesive products with no success. All other ulcers appear stable. The left heel DTI is resolving. We will continue with same treatment plan and follow-up next week. 02/14/18; follow-up for multiple areas. ooHe has [name] new area last week on the lateral aspect of his pressure ulcer more over the posterior trochanter. The original pressure ulcer looks quite stable has healthy granulation. 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 Almost fully epithelialized on the original superior area using Hydrofera Blue ooDTI on the left heel has peeled off this week to reveal [name] small superficial wound under denuded skin and subcutaneous tissue ooBoth areas between the first and second toes look better including nothing open on the left 02/21/18; ooThe patient's wounds on his left ischial tuberosity and posterior left greater trochanter actually looked better. He has [name] large area of irritation around the area which I think is contact dermatitis. I am doubtful that this is  fungal ooHis original wound on the left lateral calf continues to improve we have been using Hydrofera Blue ooThere is no open area in the left first second toe web space although there is [name] lot of thick callus ooThe DTI on the left heel required debridement today of necrotic surface eschar and subcutaneous tissue using silver alginate ooFinally the area on the right first second toe webspace continues to contract using silver alginate and ketoconazole 02/28/18 ooLeft ischial tuberosity wounds look better using silver alginate. ooOriginal wound on the left calf only has  one small open area left using Hydrofera Blue ooDTI on the left heel required debridement mostly removing skin from around this wound surface. Using silver alginate ooThe areas on the right first/second toe web space using silver alginate and ketoconazole 03/08/18 on evaluation today patient appears to be doing decently well as best I can tell in regard to his wounds. This is the first time that I have seen him as he generally is followed by Dr. Dellia Nims. With that being said none of his wounds appear to be infected he does have an area where there is some skin covering what appears to be [name] new wound on the left dorsal surface of his great toe. This is right at the nail bed. With that being said I do believe that debrided away some of the excess skin can be of benefit in this regard. Otherwise he has been tolerating the dressing changes without complication. 03/14/18; patient arrives today with the multiplicity of wounds that we are following. He has not been systemically unwell ooOriginal wound on the left lateral calf now only has 2 small open areas we've been using Hydrofera Blue which should continue ooThe deep tissue injury on the left heel requires debridement today. We've been using silver alginate ooThe left first second toe and the right first second toe are both are reminiscence what I think was tinea pedis. Apparently some of the callus Surface between the toes was removed last week when it started draining. ooPurulent drainage coming from the wound on the ischial tuberosity on the left. 03/21/18-He is here in follow-up evaluation for multiple wounds. 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 He tolerated debridement. The only change to last week's recommendations is to discontinue antifungal cream between toes. He will follow-up next week 03/28/18; following up for multiple wounds;Concern this week is streaking  redness and swelling in the right foot. He is going to need antibiotics for this. 03/31/18; follow-up for right foot cellulitis. Streaking redness and swelling in the right foot on 03/28/18. He has multiple antibiotic intolerances and [name] history of MRSA. I put him on clindamycin 300 mg every 6 and brought him in for [name] quick check. He has an open wound between his first and second toes on the right foot as [name] potential source. 04/04/18; ooRight foot cellulitis is resolving he is completing clindamycin. This is truly good news ooLeft lateral calf wound which is initial wound only has one small open area inferiorly this is close to healing out. He has compression stockings. We will use Hydrofera Blue right down to the epithelialization of this ooNonviable surface on the left heel which was initially pressure with [name] DTI. We've been using Hydrofera Blue. I'm going to switch this back to silver alginate ooLeft first second toe/tinea pedis this looks better using silver alginate ooRight first second toe tinea pedis using silver alginate ooLarge pressure ulcers on theLeft ischial  tuberosity. Small wound here Looks better. I am uncertain about the surface over the large wound. Using silver alginate 04/11/18; ooCellulitis in the right foot is resolved ooLeft lateral calf wound which was his original wounds still has 2 tiny open areas remaining this is just about closed ooNonviable surface on the left heel is better but still requires debridement ooLeft first second toe/tinea pedis still open using silver alginate ooRight first second toe wound tinea pedis I asked him to go back to using ketoconazole and silver alginate ooLarge pressure ulcers on the left ischial tuberosity this shear injury here is resolved. Wound is smaller. No evidence of infection using silver alginate 04/18/18; ooPatient arrives with an intense area of cellulitis in the right mid lower calf extending into the right heel area.  Bright red and warm. Smaller area on the left anterior leg. He has [name] significant history of MRSA. He will definitely need antibioticsoodoxycycline ooHe now has 2 open areas on the left ischial tuberosity the original large wound and now [name] satellite area which I think was above his initial satellite areas. Not [name] wonderful surface on this satellite area surrounding erythema which looks like pressure related. ooHis left lateral calf wound again his original wound is just about closed ooLeft heel pressure injury still requiring debridement ooLeft first second toe looks [name] lot better using silver alginate ooRight first second toe also using silver alginate and ketoconazole cream also looks better 04/20/18; the patient was worked in early today out of concerns with his cellulitis on the right leg. I had started him on doxycycline. This was 2 days ago. His wife was concerned about the swelling in the area. Also concerned about the left buttock. He has not been systemically unwell no fever chills. No nausea vomiting or diarrhea 04/25/18; the patient's left buttock wound is continued to deteriorate he is using Hydrofera Blue. He is still completing clindamycin for the cellulitis on the right leg although all of this looks better. 05/02/18 ooLeft buttock wound still with [name] lot of drainage and [name] very tightly adherent fibrinous necrotic surface. He has [name] deeper area superiorly ooThe left lateral calf wound is still closed ooDTI wound on the left heel necrotic surface especially the circumference using Iodoflex ooAreas between his left first second toe and right first second toe both look better. Dorsally and the right first second toe he had [name] necrotic surface although at smaller. In using silver alginate and ketoconazole. I did [name] culture last week which was [name] deep tissue culture of the reminiscence of the open wound on the right first second toe dorsally. This grew [name] few Acinetobacter and [name] few  methicillin-resistant staph aureus. Nevertheless the area actually this week looked better. I didn't feel the need to specifically address this at least in terms of systemic antibiotics. 05/09/18; wounds are measuring larger more drainage per our intake. We are using Santyl covered with alginate on the large superficial buttock wounds, Iodosorb on the left heel, ketoconazole and silver alginate to the dorsal first and second toes bilaterally. 05/16/18; ooThe area on his left buttock better in some aspects although the area superiorly over the ischial tuberosity required an extensive debridement.using Santyl ooLeft heel appears stable. Using Iodoflex ooThe areas between his first and second toes are not bad however there is spreading erythema up the dorsal aspect of his left foot this looks like cellulitis again. He is insensate the erythema is really very brilliant.o Erysipelas He went to see an allergist days ago because  he was itching part of this he had lab work done. This showed [name] white count of 15.1 with 70% neutrophils. Hemoglobin of 11.4 and [name] platelet count of 659,000. Last white count we had in [employer] was [name] 2-1/2 years ago which was 25.9 but he was ill at the time. He was able to show me some lab work that was done by his primary physician the pattern is about the same. 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 But prompted me to go ahead and do x-rays of both feet and the pelvis rule out osteomyelitis. He also had [name] comprehensive metabolic panel this was reasonably normal his albumin was 3.7 liver function tests BUN/creatinine all normal 05/23/18; x-rays of both his feet from last week were negative for underlying pulmonary abnormality. The x-ray of his pelvis however showed mild irregularity in the left ischial which may represent some early osteomyelitis. The wound in the left ischial continues to get deeper clearly now exposed muscle. Each  week necrotic surface material over this area. Whereas the rest of the wounds do not look so bad. ooThe left ischial wound we have been using Santyl and calcium alginate ooT the left heel surface necrotic debris using Iodoflex o ooThe left lateral leg is still healed ooAreas on the left dorsal foot and the right dorsal foot are about the same. There is some inflammation on the left which might represent contact dermatitis, fungal dermatitis I am doubtful cellulitis although this looks better than last week 05/30/18; CT scan done at Hospital did not show any osteomyelitis or abscess. Suggested the possibility of underlying cellulitis although I don't see [name] lot of evidence of this at the bedside ooThe wound itself on the left buttock/upper thigh actually looks somewhat better. No debridement ooLeft heel also looks better no debridement continue Iodoflex ooBoth dorsal first second toe spaces appear better using Lotrisone. Left still required debridement 06/06/18; ooIntake reported some purulent looking drainage from the left gluteal wound. Using Santyl and calcium alginate ooLeft heel looks better although still [name] nonviable surface requiring debridement ooThe left dorsal foot first/second webspace actually expanding and somewhat deeper. I may consider doing [name] shave biopsy of this area ooRight dorsal foot first/second webspace appears stable to improved. Using Lotrisone and silver alginate to both these areas 06/13/18 ooLeft gluteal surface looks better. Now separated in the 2 wounds. No debridement required. Still drainage. We'll continue silver alginate ooLeft heel continues to look better with Iodoflex continue this for at least another week ooOf his dorsal foot wounds the area on the left still has some depth although it looks better than last week. We've been using Lotrisone and silver alginate 06/20/18 ooLeft gluteal continues to look better healthy tissue ooLeft heel continues  to look better healthy granulation wound is smaller. He is using Iodoflex and his long as this continues continue the Iodoflex ooDorsal right foot looks better unfortunately dorsal left foot does not. There is swelling and erythema of his forefoot. He had minor trauma to this several days ago but doesn't think this was enough to have caused any tissue injury. Foot looks like cellulitis, we have had this problem before 06/27/18 on evaluation today patient appears to be doing [name] little worse in regard to his foot ulcer. 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 Both  of which would really be his only options for treating this infection. In the past he has been given and effusion of Orbactiv. This is done very well for him in the past again it's one time dosing IV antibiotic therapy. Subsequently I do believe this is something we're gonna need to see about doing at this point in time. Currently his other wounds seem to be doing somewhat better in my pinion I'm pretty happy in that regard. 07/03/18 on evaluation today patient's wounds actually appear to be doing fairly well. He has been tolerating the dressing changes without complication. All in all he seems to be showing signs of improvement. In regard to the antibiotics he has been dealing with infectious disease since I saw him last week as far as getting this scheduled. In the end [facility]'s [facility] this coming Friday. In the meantime he has been continuing to perform the dressing changes in such as previous. There does not appear to be any evidence of infection worsengin at this time. 07/10/18; ooSince I last saw this man 2 weeks ago things have actually improved. IV antibiotics of resulted in less forefoot erythema although there is still some present. He is  not systemically unwell ooLeft buttock wounds o2 now have no depth there is increased epithelialization Using silver alginate ooLeft heel still requires debridement using Iodoflex ooLeft dorsal foot still with [name] sizable wound about the size of [name] border but healthy granulation ooRight dorsal foot still with [name] slitlike area using silver alginate 07/18/18; the patient's cellulitis in the left foot is improved in fact I think it is on its way to resolving. ooLeft buttock wounds o2 both look better although the larger one has hypertension granulation we've been using silver alginate ooLeft heel has some thick circumferential redundant skin over the wound edge which will need to be removed today we've been using Iodoflex ooLeft dorsal foot is still [name] sizable wound required debridement using silver alginate ooThe right dorsal foot is just about closed only [name] small open area remains here 07/25/18; left foot cellulitis is resolved ooLeft buttock wounds o2 both look better. Hyper-granulation on the major area ooLeft heel as some debris over the surface but otherwise looks [name] healthier wound. Using silver collagen ooRight dorsal foot is just about closed 07/31/18; arrives with our intake nurse worried about purulent drainage from the buttock. We had hyper-granulation here last week ooHis buttock wounds o2 continue to look better ooLeft heel some debris over the surface but measuring smaller. ooRight dorsal foot unfortunately has openings between the toes ooLeft foot superficial wound looks less aggravated. 08/07/18 ooButtock wounds continue to look better although some of her granulation and the larger medial wound. silver alginate ooLeft heel continues to look [name] lot better.silver collagen ooLeft foot superficial wound looks less stable. Requires debridement. He has [name] new wound superficial area on the foot on the lateral dorsal foot. ooRight foot looks better using silver alginate without  Lotrisone 08/14/2018; patient was in the ER last week diagnosed with [name] UTI. He is now on Cefpodoxime and Macrodantin. ooButtock wounds continued to be smaller. Using silver alginate ooLeft heel continues to look better using silver collagen ooLeft foot superficial wound looks as though it is improving ooRight dorsal foot area is just about healed. 08/21/2018; patient is completed his antibiotics for his UTI. ooHe has 2 open areas on the buttocks. There is still not closed although the surface looks  satisfactory. Using silver alginate ooLeft heel continues to improve using silver collagen ooThe bilateral dorsal foot areas which are at the base of his first and second toes/possible tinea pedis are actually stable on the left but worse on the right. The area on the left required debridement of necrotic surface. After debridement I obtained [name] specimen for PCR culture. ooThe right dorsal foot which is been just about healed last week is now reopened 08/28/2018; culture done on the left dorsal foot showed coag negative staph both staph epidermidis and Lugdunensis. I think this is worthwhile initiating systemic treatment. I will use doxycycline given his long list of allergies. The area on the left heel slightly improved but still requiring debridement. ooThe large wound on the buttock is just about closed whereas the smaller one is larger. Using silver alginate in this area 09/04/2018; patient is completing his doxycycline for the left foot although this continues to be [name] very difficult wound area with very adherent necrotic debris. We are using silver alginate to all his wounds right foot left foot and the small wounds on his buttock, silver collagen on the left heel. 09/11/2018; once again this patient has intense erythema and swelling of the left forefoot. Lesser degrees of erythema in the right foot. He has [name] long list of allergies and intolerances. I will reinstitute doxycycline. oo2 small  areas on the left buttock are all the left of his major stage III pressure ulcer. Using silver alginate ooLeft heel also looks better using silver collagen ooUnfortunately both the areas on his feet look worse. The area on the left first second webspace is now gone through to the plantar part of his foot. The area on the left foot anteriorly is irritated with erythema and swelling in the forefoot. 09/25/2018 ooHis wound on the left plantar heel looks better. Using silver collagen ooThe area on the left buttock 2 small remnant areas. One is closed one is still open. Using silver alginate ooThe areas between both his first and second toes look worse. This in spite of long-standing antifungal therapy with ketoconazole and silver alginate which should have antifungal activity ooHe has small areas around his original wound on the left calf one is on the bottom of the original scar tissue and one superiorly both of these are small and superficial but again given wound history in this site this is worrisome 10/02/2018 ooLeft plantar heel continues to gradually contract using silver collagen ooLeft buttock wound is unchanged using silver alginate ooThe areas on his dorsal feet between his first and second toes bilaterally look about the same. 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 Small open area here. We have been using silver alginate 10/09/2018; ooLeft plantar heel continues to look healthy and contract. No debridement is required ooLeft buttock slightly smaller with [name] tape injury wound just below which was new this week ooDorsal feet somewhat improved I have been using clindamycin ooLeft lateral looks lower extremity the actual open area looks worse although [name] lot of this is epithelialized. I am going to  change to silver collagen today He has [name] lot more swelling in the right leg although this is not pitting not red and not particularly warm there is [name] lot of spasm in the right leg usually indicative of people with paralysis of some underlying discomfort. We have reviewed  his vascular status from 2017 he had [name] left greater saphenous vein ablation. I wonder about referring him back to vascular surgery if the area on the left leg continues to deteriorate. 10/16/2018 in today for follow-up and management of multiple lower extremity ulcers. His left Buttock wound is much lower smaller and almost closed completely. The wound to the left ankle has began to reopen with Epithelialization and some adherent slough. He has multiple new areas to the left foot and leg. The left dorsal foot without much improvement. Wound present between left great webspace and 2nd toe. Erythema and edema present right leg. Right LE ultrasound obtained on 10/10/18 was negative for DVT . 10/23/2018; ooLeft buttock is closed over. Still dry macerated skin but there is no open wound. I suspect this is chronic pressure/moisture ooLeft lateral calf is quite [name] bit worse than when I saw this last. There is clearly drainage here he has macerated skin into the left plantar heel. We will change the primary dressing to alginate ooLeft dorsal foot has some improvement in overall wound area. Still using clindamycin and silver alginate ooRight dorsal foot about the same as the left using clindamycin and silver alginate ooThe erythema in the right leg has resolved. He is DVT rule out was negative ooLeft heel pressure area required debridement although the wound is smaller and the surface is health 10/26/2018 ooThe patient came back in for his nurse check today predominantly because of the drainage coming out of the left lateral leg with [name] recent reopening of his original wound on the left lateral calf. He comes in today with [name] large  amount of surrounding erythema around the wound extending from the calf into the ankle and even in the area on the dorsal foot. He is not systemically unwell. He is not febrile. Nevertheless this looks like cellulitis. We have been using silver alginate to the area. I changed him to [name] regular visit and I am going to prescribe him doxycycline. The rationale here is [name] long list of medication intolerances and [name] history of MRSA. I did not see anything that I thought would provide [name] valuable culture 10/30/2018 ooFollow-up from his appointment 4 days ago with really an extensive area of cellulitis in the left calf left lateral ankle and left dorsal foot. I put him on doxycycline. He has [name] long list of medication allergies which are true allergy reactions. 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 In any case he is [name] lot better today. The erythema especially in the anterior and lateral left calf is better. He still has left ankle erythema. He also is complaining about increasing edema in the right leg we have only been using Kerlix Coban and he has been doing the wraps at home. Finally he has [name] spotty rash on the medial part of his upper left calf which looks like folliculitis or perhaps wrap occlusion type injury. Small superficial macules not pustules 11/06/18 patient arrives today with again [name] considerable degree of erythema around the wound on the left lateral calf extending into the dorsal ankle and dorsal foot. This is [name] lot worse than when I saw this last week. He is on doxycycline really with not [name] lot of improvement. He has not been systemically unwell Wounds on the; left heel actually looks improved. Original area on the left foot and proximity to the first and second toes looks about the same. He has superficial areas  on the dorsal foot, anterior calf and then the reopening of his original wound on the left lateral calf which  looks about the same ooThe only area he has on the right is the dorsal webspace first and second which is smaller. ooHe has [name] large area of dry erythematous skin on the left buttock small open area here. 11/13/2018; the patient arrives in much better condition. The erythema around the wound on the left lateral calf is [name] lot better. Not sure whether this was the clindamycin or the TCA and ketoconazole or just in the improvement in edema control [stasis dermatitis]. In any case this is [name] lot better. The area on the left heel is very small and just about resolved using silver collagen we have been using silver alginate to the areas on his dorsal feet 11/20/2018; his wounds include the left lateral calf, left heel, dorsal aspects of both feet just proximal to the first second webspace. He is stable to slightly improved. I did not think any changes to his dressings were going to be necessary 11/27/2018 he has [name] reopening on the left buttock which is surrounded by what looks like tinea or perhaps some other form of dermatitis. The area on the left dorsal foot has some erythema around it I have marked this area but I am not sure whether this is cellulitis or not. Left heel is not closed. Left calf the reopening is really slightly longer and probably worse 1/13; in general things look better and smaller except for the left dorsal foot. Area on the left heel is just about closed, left buttock looks better only [name] small wound remains in the skin looks better [using Lotrisone] 1/20; the area on the left heel only has [name] few remaining open areas here. Left lateral calf about the same in terms of size, left dorsal foot slightly larger right lateral foot still not closed. The area on the left buttock has no open wound and the surrounding skin looks [name] lot better 1/27; the area on the left heel is closed. Left lateral calf better but still requiring extensive debridements. The area on his left buttock is closed. He  still has the open areas on the left dorsal foot which is slightly smaller in the right foot which is slightly expanded. We have been using Iodoflex on these areas as well 2/3; left heel is closed. Left lateral calf still requiring debridement using Iodoflex there is no open area on his left buttock however he has dry scaly skin over [name] large area of this. Not really responding well to the Lotrisone. Finally the areas on his dorsal feet at the level of the first second webspace are slightly smaller on the right and about the same on the left. Both of these vigorously debrided with Anasept and gauze 2/10; left heel remains closed he has dry erythematous skin over the left buttock but there is no open wound here. Left lateral leg has come in and with. Still requiring debridement we have been using Iodoflex here. Finally the area on the left dorsal foot and right dorsal foot are really about the same extremely dry callused fissured areas. He does not yet have [name] dermatology appointment 2/17; left heel remains closed. He has [name] new open area on the left buttock. The area on the left lateral calf is bigger longer and still covered in necrotic debris. No major change in his foot areas bilaterally. I am awaiting for [name] dermatologist to look on this. We  have been using ketoconazole I do not know that this is been doing any good at all. 2/24; left heel remains closed. The left buttock wound that was new reopening last week looks better. The left lateral calf appears better also although still requires debridement. The major area on his foot is the left first second also requiring debridement. We have been putting Prisma on all wounds. I do not believe that the ketoconazole has done too much good for his feet. He will use Lotrisone I am going to give him [name] 2-week course of terbinafine. We still do not have [name] dermatology appointment 3/2 left heel remains closed however there is skin over bone in this area I pointed  this out to him today. The left buttock wound is epithelialized but still does not look completely stable. The area on the left leg required debridement were using silver collagen here. With regards to his feet we changed to Lotrisone last week and silver alginate. 3/9; left heel remains closed. Left buttock remains closed. The area on the right foot is essentially closed. The left foot remains unchanged. Slightly smaller on the left lateral calf. Using silver collagen to both of these areas 3/16-Left heel remains closed. Area on right foot is closed. Left lateral calf above the lateral malleolus open wound requiring debridement with easy bleeding. Left dorsal wound proximal to first toe also debrided. Left ischial area open new. Patient has been using Prisma with wrapping every 3 days. Dermatology appointment is apparently tomorrow.Patient has completed his terbinafine 2-week course with some apparent improvement according to him, there is still flaking and dry skin in his foot on the left 3/23; area on the right foot is reopened. The area on the left anterior foot is about the same still [name] very necrotic adherent surface. He still has the area on the left leg and reopening is on the left buttock. He apparently saw dermatology although I do not have [name] note. According to the patient who is usually fairly well informed they did not have any good ideas. Put him on oral terbinafine which she is been on before. 3/30; using silver collagen to all wounds. Apparently his dermatologist put him on doxycycline and rifampin presumably some culture grew staph. I do not have this result. He remains on terbinafine although I have used terbinafine on him before 4/6; patient has had [name] fairly substantial reopening on the right foot between the first and second toes. He is finished his terbinafine and I believe is on doxycycline and rifampin still as prescribed by dermatology. We have been using silver collagen to all  his wounds although the patient reports that he thinks silver alginate does better on the wounds on his buttock. 4/13; the area on his left lateral calf about the same size but it did not require debridement. ooLeft dorsal foot just proximal to the webspace between the first and second toes is about the same. Still nonviable surface. I note some superficial bronze discoloration of the dorsal part of his foot ooRight dorsal foot just proximal to the first and second toes also looks about the same. I still think there may be the same discoloration I noted above on the left ooLeft buttock wound looks about the same 4/20; left lateral calf appears to be gradually contracting using silver collagen. 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 The left dorsal foot wound is debrided of tightly adherent necrotic debris and really cleans up quite nicely.  The right area is worse with expansion. I did not debride this it is now over the base of the second toe ooThe area on his left buttock is smaller no debridement is required using silver collagen 5/4; left calf continues to make good progress. ooHe arrives with erythema around the wounds on his dorsal foot which even extends to the plantar aspect. Very concerning for coexistent infection. He is finished the erythromycin I gave him for possible erythrasma this does not seem to have helped. ooThe area on the left foot is about the same base of the dorsal toes ooIs area on the buttock looks improved on the left 5/11; left calf and left buttock continued to make good progress. Left foot is about the same to slightly improved. ooMajor problem is on the right foot. He has not had an x-ray. Deep tissue culture I did last week showed both Enterobacter and E. coli. I did not change the doxycycline I put him on empirically although neither 1 of these were plated to doxycycline. He arrives today with  the erythema looking worse on both the dorsal and plantar foot. Macerated skin on the bottom of the foot. he has not been systemically unwell 5/18-Patient returns at 1 week, left calf wound appears to be making some progress, left buttock wound appears slightly worse than last time, left foot wound looks slightly better, right foot redness is marginally better. X-ray of both feet show no air or evidence of osteomyelitis. Patient is finished his Omnicef and terbinafine. He continues to have macerated skin on the bottom of the left foot as well as right 5/26; left calf wound is better, left buttock wound appears to have multiple small superficial open areas with surrounding macerated skin. X-rays that I did last time showed no evidence of osteomyelitis in either foot. He is finished cefdinir and doxycycline. I do not think that he was on terbinafine. He continues to have [name] large superficial open area on the right foot anterior dorsal and slightly between the first and second toes. I did send him to dermatology 2 months ago or so wondering about whether they would do [name] fungal scraping. I do not believe they did but did do [name] culture. We have been using silver alginate to the toe areas, he has been using antifungals at home topically either ketoconazole or Lotrisone. We are using silver collagen on the left foot, silver alginate on the right, silver collagen on the left lateral leg and silver alginate on the left buttock 6/1; left buttock area is healed. We have the left dorsal foot, left lateral leg and right dorsal foot. We are using silver alginate to the areas on both feet and silver collagen to the area on his left lateral calf 6/8; the left buttock apparently reopened late last week. He is not really sure how this happened. He is tolerating the terbinafine. Using silver alginate to all wounds 6/15; left buttock wound is larger than last week but still superficial. ooCame in the clinic today with [name]  report of purulence from the left lateral leg I did not identify any infection ooBoth areas on his dorsal feet appear to be better. He is tolerating the terbinafine. Using silver alginate to all wounds 6/22; left buttock is about the same this week, left calf quite [name] bit better. His left foot is about the same however he comes in with erythema and warmth in the right forefoot once again. Culture that I gave him in the beginning of  May showed Enterobacter and E. coli. I gave him doxycycline and things seem to improve although neither 1 of these organisms was specifically plated. 6/29; left buttock is larger and dry this week. Left lateral calf looks to me to be improved. Left dorsal foot also somewhat improved right foot completely unchanged. The erythema on the right foot is still present. He is completing the Ceftin dinner that I gave him empirically [see discussion above.) 7/6 - All wounds look to be stable and perhaps improved, the left buttock wound is slightly smaller, per patient bleeds easily, completed ceftin, the right foot redness is less, he is on terbinafine 7/13; left buttock wound about the same perhaps slightly narrower. Area on the left lateral leg continues to narrow. Left dorsal foot slightly smaller right foot about the same. We are using silver alginate on the right foot and Hydrofera Blue to the areas on the left. Unna boot on the left 2 layer compression on the right 7/20; left buttock wound absolutely the same. Area on lateral leg continues to get better. Left dorsal foot require debridement as did the right no major change in the 7/27; left buttock wound the same size necrotic debris over the surface. The area on the lateral leg is closed once again. His left foot looks better right foot about the same although there is some involvement now of the posterior first second toe area. He is still on terbinafine which I have given him for [name] month, not certain [name] centimeter major  change 06/25/19-All wounds appear to be slightly improved according to report, left buttock wound looks clean, both foot wounds have minimal to no debris the right dorsal foot has minimal slough. We are using Hydrofera Blue to the left and silver alginate to the right foot and ischial wound. 8/10-Wounds all appear to be around the same, the right forefoot distal part has some redness which was not there before, however the wound looks clean and small. Ischial wound looks about the same with no changes 8/17; his wound on the left lateral calf which was his original chronic venous insufficiency wound remains closed. Since I last saw him the areas on the left dorsal foot right dorsal foot generally appear better but require debridement. The area on his left initial tuberosity appears somewhat larger to me perhaps hyper granulated and bleeds very easily. We have been using Hydrofera Blue to the left dorsal foot and silver alginate to everything else 8/24; left lateral calf remains closed. The areas on his dorsal feet on the webspace of the first and second toes bilaterally both look better. The area on the left buttock which is the pressure ulcer stage II slightly smaller. I change the dressing to Hydrofera Blue to all areas 8/31; left lateral calf remains closed. The area on his dorsal feet bilaterally look better. Using Hydrofera Blue. Still requiring debridement on the left foot. No change in the left buttock pressure ulcers however 9/14; left lateral calf remains closed. Dorsal feet look quite [name] bit better than 2 weeks ago. Flaking dry skin also [name] lot better with the ammonium lactate I gave him 2 weeks ago. The area on the left buttock is improved. He states that his Roho cushion developed [name] leak and he is getting [name] new one, in the interim he is offloading this vigorously 9/21; left calf remains closed. Left heel which was [name] possible DTI looks better this week. He had macerated tissue around the left  dorsal foot right foot looks  satisfactory and improved left buttock wound. I changed his dressings to his feet to silver alginate bilaterally. Continuing Hydrofera Blue on the left buttock. 9/28 left calf remains closed. Left heel did not develop anything [possible DTI] dry flaking skin on the left dorsal foot. Right foot looks satisfactory. Improved left buttock wound. We are using silver alginate on his feet Hydrofera Blue on the buttock. I have asked him to go back to the Lotrisone on his feet including the wounds and surrounding areas 10/5; left calf remains closed. The areas on the left and right feet about the same. [name] lot of this is epithelialized however debris over the remaining open areas. He is using Lotrisone and silver alginate. The area on the left buttock using Hydrofera Blue 10/26. Patient has been out for 3 weeks secondary to Covid concerns. He tested negative but I think his wife tested positive. He comes in today with the left foot substantially worse, right foot about the same. Even more concerning he states that the area on his left buttock closed over but then reopened and is considerably deeper in one aspect than it was before [stage III wound] 11/2; left foot really about the same as last week. Quarter sized wound on the dorsal foot just proximal to the first second toes. Surrounding erythema with areas of denuded epithelium. This is not really much different looking. Did not look like cellulitis this time however. ooRight foot area about the same.. We have been using silver alginate alginate on his toes ooLeft buttock still substantial irritated skin around the wound which I think looks somewhat better. We have been using Hydrofera Blue here. 11/9; left foot larger than last week and [name] very necrotic surface. Right foot I think is about the same perhaps slightly smaller. Debris around the circumference also addressed. Unfortunately on the left buttock there is been [name] decline.  Satellite lesions below the major wound distally and now [name] an additional one posteriorly we have been using Hydrofera Blue but I think this is [name] pressure issue 11/16; left foot ulcer dorsally again [name] very adherent necrotic surface. Right foot is about the same. Not much change in the pressure ulcer on his left buttock. 11/30; left foot ulcer dorsally basically the same as when I saw him 2 weeks ago. Very adherent fibrinous debris on the wound surface. Patient reports [name] lot of drainage as well. 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 We have been using Iodoflex, patient changed back to alginate because of the drainage. Area on his right dorsal foot really looks benign with [name] healthier surface certainly [name] lot better than on the left. Left buttock wounds all improved using Hydrofera Blue 12/7; left dorsal foot again no improvement. Tightly adherent debris. PCR culture I did last week only showed likely skin contaminant. I have gone ahead and done [name] punch biopsy of this which is about the last thing in terms of investigations I can think to do. He has known venous insufficiency and venous hypertension and this could be the issue here. The area on the right foot is about the same left buttock slightly worse according to our intake nurse secondary to [name] Blue sticking to the wound 12/14; biopsy of the left foot that I did last time showed changes that could be related to wound healing/chronic stasis dermatitis phenomenon no neoplasm. We have been using silver alginate to both feet. I change the one on the left today to [facility]  and silver alginate to his other 2 wounds 12/28; the patient arrives with the following problems; ooMajor issue is the dorsal left foot which continues to be [name] larger deeper wound area. Still with [name] completely nonviable surface ooParadoxically the area mirror image on the right on the right dorsal foot appears to be getting  better. ooHe had some loss of dry denuded skin from the lower part of his original wound on the left lateral calf. Some of this area looked [name] little vulnerable and for this reason we put him in wrap that on this side this week ooThe area on his left buttock is larger. He still has the erythematous circular area which I think is [name] combination of pressure, sweat. This does not look like cellulitis or fungal dermatitis 11/26/2019; -Dorsal left foot large open wound with depth. Still debris over the surface. Using Sorbact ooThe area on the dorsal right foot paradoxically has closed over [facility] has [name] reopening on the left ankle laterally at the base of his original wound that extended up into the calf. This appears clean. ooThe left buttock wound is smaller but with very adherent necrotic debris over the surface. We have been using silver alginate here as well The patient had arterial studies done in 2017. He had biphasic waveforms at the dorsalis pedis and posterior tibial bilaterally. ABI in the left was 1.17. Digit waveforms were dampened. He has slight spasticity in the great toes I do not think [name] TBI would be possible 1/11; the patient comes in today with [name] sizable reopening between the first and second toes on the right. This is not exactly in the same location where we have been treating wounds previously. According to our intake nurse this was actually fairly deep but 0.6 cm. The area on the left dorsal foot looks about the same the surface is somewhat cleaner using Sorbact, his MRI is in 2 days. We have not managed yet to get arterial studies. The new reopening on the left lateral calf looks somewhat better using alginate. The left buttock wound is about the same using alginate 1/18; the patient had his ARTERIAL studies which were quite normal. ABI in the right at 1.13 with triphasic/biphasic waveforms on the left ABI 1.06 again with triphasic/biphasic waveforms. It would not have been possible  to have done [name] toe brachial index because of spasticity. We have been using Sorbac to the left foot alginate to the rest of his wounds on the right foot left lateral calf and left buttock 1/25; arrives in clinic with erythema and swelling of the left forefoot worse over the first MTP area. This extends laterally dorsally and but also posteriorly. Still has an area on the left lateral part of the lower part of his calf wound it is eschared and clearly not closed. ooArea on the left buttock still with surrounding irritation and erythema. ooRight foot surface wound dorsally. The area between the right and first and second toes appears better. 2/1; ooThe left foot wound is about the same. Erythema slightly better I gave him [name] week of doxycycline empirically ooRight foot wound is more extensive extending between the toes to the plantar surface ooLeft lateral calf really no open surface on the inferior part of his original wound however the entire area still looks vulnerable ooAbsolutely no improvement in the left buttock wound required debridement. 2/8; the left foot is about the same. Erythema is slightly improved I gave him clindamycin last week. ooRight foot looks better he is  using Lotrimin and silver alginate ooHe has [name] breakdown in the left lateral calf. Denuded epithelium which I have removed ooLeft buttock about the same were using Hydrofera Blue 2/15; left foot is about the same there is less surrounding erythema. Surface still has tightly adherent debris which I have debriding however not making any progress ooRight foot has [name] substantial wound on the medial right second toe between the first and second webspace. ooStill an open area on the left lateral calf distal area. ooButtock wound is about the same 2/22; left foot is about the same less surrounding erythema. Surface has adherent debris. Polymen Ag Right foot area significant wound between the first and second toes. We have  been using silver alginate here Left lateral leg polymen Ag at the base of his original venous insufficiency wound ooLeft buttock some improvement here 3/1; ooRight foot is deteriorating in the first second toe webspace. Larger and more substantial. We have been using silver alginate. ooLeft dorsal foot about the same markedly adherent surface debris using PolyMem Ag ooLeft lateral calf surface debris using PolyMem AG ooLeft buttock is improved again using PolyMem Ag. ooHe is completing his terbinafine. The erythema in the foot seems better. He has been on this for 2 weeks 3/8; no improvement in any wound area in fact he has [name] small open area on the dorsal midfoot which is new this week. He has not gotten his foot x-rays yet 3/15; his x-rays were both negative for osteomyelitis of both feet. No major change in any of his wounds on the extremities however his buttock wounds are better. We have been using polymen on the buttocks, left lower leg. Iodoflex on the left foot and silver alginate on the right 3/22; arrives in clinic today with the 2 major issues are the improvement in the left dorsal foot wound which for once actually looks healthy with [name] nice healthy wound surface without debridement. Using Iodoflex here. Unfortunately on the left lateral calf which is in the distal part of his original wound he came to the clinic here for there was purulent drainage noted some increased breakdown scattered around the original area and [name] small area proximally. We we are using polymen here will change to silver alginate today. His buttock wound on the left is better and I think the area on the right first second toe webspace is also improved 3/29; left dorsal foot looks better. Using Iodoflex. Left ankle culture from deterioration last time grew E. coli, Enterobacter and Enterococcus. I will give him [name] course of cefdinir although that will not cover Enterococcus. The area on the right foot in the  webspace of the first and second toe lateral first toe looks better. The area on his buttock is about healed Vascular appointment is on April 21. This is to look at his venous system vis--vis continued breakdown of the wounds on the left including the left lateral leg and left dorsal foot he. He has had previous ablations on this side 4/5; the area between the right first and second toes lateral aspect of the first toe looks better. Dorsal aspect of the left first toe on the left foot also improved. Unfortunately the left lateral lower leg is larger and there is [name] second satellite wound superiorly. The usual superficial abrasions on the left buttock overall better but certainly not closed 4/12; the area between the right first and second toes is improved. Dorsal aspect of the left foot also slightly smaller with [name] vibrant healthy  looking surface. No real change in the left lateral leg and the left buttock wound is healed He has an unaffordable co-pay for Apligraf. Appointment with vein and vascular with regards to the left leg venous part of the circulation is on 4/21 4/19; we continue to see improvement in all wound areas. Although this is minor. He has his vascular appointment on 4/21. The area on the left buttock has not reopened although right in the center of this area the skin looks somewhat threatened 4/26; the left buttock is unfortunately reopened. In general his left dorsal foot has [name] healthy surface and looks somewhat smaller although it was not measured as such. The area between his first and second toe webspace on the right as [name] small wound against the first toe. The patient saw vascular surgery. The real question I was asking was about the small saphenous vein on the left. He has previously ablated left greater saphenous vein. Nothing further was commented on on the left. Right greater saphenous vein without reflux at the saphenofemoral junction or proximal thigh there was no  indication for ablation of the right greater saphenous vein duplex was negative for DVT bilaterally. They did not think there was anything from [name] point of view that could be offered. They ABIs within normal limits 5/3; only small open area on the left buttock. The area on the left lateral leg which was his original venous reflux is now 2 wounds both which look clean. We are using Iodoflex on the left dorsal foot which looks healthy and smaller. He is down to [name] very tiny area between the right first and second toes, using silver alginate 5/10; all of his wounds appear better. We have much better edema control in 4 layer compression on the left. This may be the factor that is allowing the left foot and left lateral calf to heal. He has external compression garments at home 04/14/20-All of his wounds are progressing well, the left forefoot is practically closed, left ischium appears to be about the same, right toe webspace is also smaller. The left lateral leg is about the same, continue using Hydrofera Blue to this, silver alginate to the ischium, Iodoflex to the toe space on the right 6/7; most of his wounds outside of the left buttock are doing well. The area on the left lateral calf and left dorsal foot are smaller. The area on the right foot in between the first and second toe webspace is barely visible although he still says there is some drainage here is the only reason I did not heal this out. ooUnfortunately the area on the left buttock almost looks like he has [name] skin tear from tape. He has open wound and then [name] large flap of skin that we are trying to get adherence over an area just next to the remaining wound 6/21; 2 week follow-up. I believe is been here for nurse visits. Miraculously the area between his first and second toes on the left dorsal foot is closed over. Still open on the right first second web space. The left lateral calf has 2 open areas. Distally this is more  superficial. The proximal area had [name] little more depth and required debridement of adherent necrotic material. His buttock wound is actually larger we have been using silver alginate here 6/28; the patient's area on the left foot remains closed. Still open wet area between the first and second toes on the right and also extending into the plantar  aspect. We have been using silver alginate in this location. He has 2 areas on the left lower leg part of his original long wounds which I think are better. We have been using Hydrofera Blue here. Hydrofera Blue to the left buttock which is stable 7/12; left foot remains closed. Left ankle is closed. May be [name] small area between his right first and second toes the only truly open area is on the left buttock. We have been using Hydrofera Blue here 7/19; patient arrives with marked deterioration especially in the left foot and ankle. We did not put him in [name] compression wrap on the left last week in fact he wore his juxta lite stockings on either side although he does not have an underlying stocking. He has [name] reopening on the left dorsal foot, left lateral ankle and [name] new area on the right dorsal ankle. More worrisome is the degree of erythema on the left foot extending on the lateral foot into the lateral lower leg on the left 7/26; the patient had erythema and drainage from the lateral left ankle last week. Culture of this grew MRSA resistant to doxycycline and clindamycin which are the 2 antibiotics we usually use with this patient who has multiple antibiotic allergies including linezolid, trimethoprim sulfamethoxazole. I had give him an empiric doxycycline and he comes in the area certainly looks somewhat better although it is blotchy in his lower leg. He has not been systemically unwell. He has had areas on the left dorsal foot which is [name] reopening, chronic wounds on the left lateral ankle. Both of these I think are secondary to chronic venous insufficiency.  The area between his first and second toes is closed as far as I can tell. He had [name] new wrap injury on the right dorsal ankle last week. Finally he has an area on the left buttock. We have been using silver alginate to everything except the left buttock we are using Hydrofera Blue 06/30/20-Patient returns at 1 week, has been given [name] sample dose pack of NUZYRA which is [name] tetracycline derivative [omadacycline], patient has completed those, we have been using silver alginate to almost all the wounds except the left ischium where we are using Hydrofera Blue all of them look better 8/16; since I last saw the patient he has been doing well. The area on the left buttock, left lateral ankle and left foot are all closed today. He has completed the [place] I gave him last time and tolerated this well. He still has open areas on the right dorsal ankle and in the right first second toe area which we are using silver alginate. 8/23; we put him in his bilateral external compression stockings last week as he did not have anything open on either leg except for concerning area between the right first and second toe. He comes in today with an area on the left dorsal foot slightly more proximal than the original wound, the left lateral foot but this is actually [name] continuation of the area he had on the left lateral ankle from last time. As well he is opened up on the left buttock again. 8/30; comes in today with things looking [name] lot better. The area on the left lower ankle has closed down as has the left foot but with eschar in both areas. The area on the dorsal right ankle is also epithelialized. Very little remaining of the left buttock wound. We have been using silver alginate on all wound areas 9/13; the  area in the first second toe webspace on the right has fully epithelialized. He still has some vulnerable epithelium on the right and the ankle and the dorsal foot. He notes weeping. He is using his juxta lite  stocking. On the left again the left dorsal foot is closed left lateral ankle is closed. We went to the juxta lite stocking here as well. ooStill vulnerable in the left buttock although only 2 small open areas remain here 9/27; 2-week follow-up. We did not look at his left leg but the patient says everything is closed. He is [name] bit disturbed by the amount of edema in his left foot he is using juxta lite stockings but asking about over the toes stockings which would be 30/40, will talk to him next time. According to him there is no open wound on either the left foot or the left ankle/calf He has an open area on the dorsal right calf which I initially point [name] wrap injury. He has superficial remaining wound on the left ischial tuberosity been using silver alginate although he says this sticks to the wound 10/5; we gave him 2-week follow-up but he called yesterday expressing some concerns about his right foot right ankle and the left buttock. He came in early. There is still no open areas on the left leg and that still in his juxta lite stocking 10/11; he only has 1 small area on the left buttock that remains measuring millimeters 1 mm. Still has the same irritated skin in this area. We recommended zinc oxide when this eventually closes and pressure relief is meticulously is he can do this. He still has an area on the dorsal part of his right first through third toes which is [name] bit irritated and still open and on the dorsal ankle near the crease of the ankle. We have been using silver alginate and using his own stocking. He has nothing open on the left leg or foot 10/25; 2-week follow-up. Not nearly as good on the left buttock as I was hoping. For open areas with 5 looking threatened small. He has the erythematous irritated chronic skin in this area. oo1 area on the right dorsal ankle. He reports this area bleeds easily ooRight dorsal foot just proximal to the base of his toes ooWe have been using  silver alginate. 11/8; 2-week follow-up. Left buttock is about the same although I do not think the wounds are in the same location we have been using silver alginate. I have asked him to use zinc oxide on the skin around the wounds. ooHe still has [name] small area on the right dorsal ankle he reports this bleeds easily ooRight dorsal foot just proximal to the base of the toes does not have anything open although the skin is very dry and scaly ooHe has [name] new opening on the nailbed of the left great toe. Nothing on the left ankle 11/29; 3-week follow-up. Left buttock has 2 open areas. And washing of these wounds today started bleeding easily. Suggesting very friable tissue. We have been using silver alginate. Right dorsal ankle which I thought was initially [name] wrap injury we have been using silver alginate. Nothing open between the toes that I can see. He states the area on the left dorsal toe nailbed healed after the last visit in 2 or 3 days 12/13; 3-week follow-up. His left buttock now has 3 open areas but the original 2 areas are smaller using polymen here. Surrounding skin looks better. The right dorsal  ankle is closed. He has [name] small opening on the right dorsal foot at the level of the third toe. In general the skin looks better here. He is wearing his juxta lite stocking on the left leg says there is nothing open 11/24/2020; 3 weeks follow-up. His left buttock still has the 3 open areas. We have been using polymen but due to lack of response he changed to [facility] area. Surrounding skin is dry erythematous and irritated looking. There is no evidence of infection either bacterial or fungal however there is loss of surface epithelium ooHe still has very dry skin in his foot causing irritation and erythema on the dorsal part of his toes. This is not responded to prolonged courses of antifungal simply looks dry and irritated 1/24; left buttock area still looks about the same he was unable to  find the triad ointment that we had suggested. The area on the right lower leg just above the dorsal ankle has reopened and the areas on the right foot between the first second and second third toes and scaling on the bottom of the foot has been about the same for quite some time now. been using silver alginate to all wound areas 2/7; left buttock wound looked quite good although not much smaller in terms of surface area surrounding skin looks better. Only [name] few dry flaking areas on the right foot in between the first and second toes the skin generally looks better here [ammonium lactate]. Finally the area on the right dorsal ankle is closed 2/21; ooThere is no open area on the right foot even between the right first and second toe. Skin around this area dorsally and plantar aspects look better. ooHe has [name] reopening of the area on the right ankle just above the crease of the ankle dorsally. I continue to think that this is probably friction from spasms may be even this time with his stocking under the compression stockings. ooWounds on his left buttock look about the same there [name] couple of areas that have reopened. He has [name] total square area of loss of epithelialization. This does not look like infection it looks like [name] contact dermatitis but I just cannot determine to what 3/14; there is nothing on the right foot between the first and second toes this was carefully inspected under illumination. Some chronic irritation on the dorsal part of his foot from toes 1-3 at the base. Nothing really open here substantially. Still has an area on the right foot/ankle that is actually larger and hyper granulated. His buttock area on the left is just about closed however he has chronic inflammation with loss of the surface epithelial layer 3/28; 2-week follow-up. In clinic today with [name] new wound on the left anterior mid tibia. Says this happened about 2 weeks ago. He is not really sure how wonders about the  spasticity of his legs at night whether that could have caused this other than that he does not have [name] good idea. He has been using topical antibiotics and silver alginate. The area on his right dorsal ankle seems somewhat better. ooFinally everything on his left buttock is closed. 4/11; 2-week follow-up. All of his wounds are better except for the area over the ischium and left buttock which have opened up widely again. At least part of this is covered in necrotic fibrinous material another part had rolled nonviable skin. The area on the right ankle, left anterior mid tibia are both [name] lot better. He had no  open wounds on either foot including the areas between the first and second toes 4/25; patient presents for 2-week follow-up. He states that the wounds are overall stable. He has no complaints today and states he is using Hydrofera Blue to open wounds. 5/9; have not seen this man in over [name] month. For my memory he has open areas on the left mid tibia and right ankle. T oday he has new open area on the right dorsal foot which we have not had [name] problem with recently. He has the sustained area on the left buttock He is also changed his insurance at the beginning of the year [employer]. We will need prior authorizations for debridement 5/23; patient presents for 2-week follow-up. He has prior authorizations for debridement. He denies any issues in the past 2 weeks with his wound care. He has been using Hydrofera Blue to all the wounds. He does report [name] circular rash to the upper left leg that is new. He denies acute signs of infection. 6/6; 2-week follow-up. The patient has open wounds on the left buttock which are worse than the last time I saw this about [name] month ago. He also has [name] new area to me on the left anterior mid tibia with some surrounding erythema. The area on the dorsal ankle on the right is closed but I think this will be [name] friction injury every time this area is exposed to either our  wraps or his compression stockings caused by unrelenting spasms in this leg. Objective Constitutional Sitting or standing Blood Pressure is within target range for patient.. Pulse regular and within target range for patient.Marland Kitchen Respirations regular, non-labored and within target range.. Temperature is normal and within the target range for the patient.Marland Kitchen Appears in no distress. Vitals Time Taken: 8:15 AM, Height: 70 in, Weight: 216 lbs, BMI: 31, Temperature: 98.4 F, Pulse: 108 bpm, Respiratory Rate: 16 breaths/min, Blood Pressure: 109/73 mmHg. Cardiovascular Pedal pulses are palpable. Edema control is good. General Notes: Wound exam oo Right dorsal ankle is closed oo Left anterior mid tibia, small open wound with surrounding erythema. oo The area on the left buttock is not in good condition. Almost [name] square area of erythema. Increasing skin breakdown in several areas within this including the original area we are dealing with. Integumentary (Hair, Skin) Wound #41R status is Open. Original cause of wound was Gradually Appeared. The date acquired was: 03/16/2020. The wound has been in treatment 58 weeks. The wound is located on the Left Ischium. The wound measures 5cm length x 3cm width x 0.2cm depth; 11.781cm^2 area and 2.356cm^3 volume. There is Fat Layer (Subcutaneous Tissue) exposed. There is no tunneling or undermining noted. There is [name] medium amount of serosanguineous drainage noted. The wound margin is distinct with the outline attached to the wound base. There is large (67-100%) red, pink, friable granulation within the wound bed. There is [name] small (1- 33%) amount of necrotic tissue within the wound bed including Adherent Slough. Wound #50 status is Healed - Epithelialized. Original cause of wound was Gradually Appeared. The date acquired was: 01/12/2021. The wound has been in treatment 15 weeks. The wound is located on the Right,Dorsal Ankle. The wound measures 0cm length x 0cm width x 0cm  depth; 0cm^2 area and 0cm^3 volume. Wound #51 status is Open. Original cause of wound was Trauma. The date acquired was: 01/26/2021. The wound has been in treatment 10 weeks. The wound is located on the Left,Anterior Lower Leg. The wound measures 1.2cm  length x 0.8cm width x 0.1cm depth; 0.754cm^2 area and 0.075cm^3 volume. There is Fat Layer (Subcutaneous Tissue) exposed. There is no tunneling or undermining noted. There is [name] medium amount of serosanguineous drainage noted. The wound margin is well defined and not attached to the wound base. There is large (67-100%) red, friable granulation within the wound bed. There is no necrotic tissue within the wound bed. Wound #52 status is Open. Original cause of wound was Gradually Appeared. The date acquired was: 03/27/2021. The wound has been in treatment 4 weeks. The wound is located on the Right,Dorsal Foot. The wound measures 0.2cm length x 0.2cm width x 0.1cm depth; 0.031cm^2 area and 0.003cm^3 volume. There is Fat Layer (Subcutaneous Tissue) exposed. There is no tunneling or undermining noted. There is [name] medium amount of sanguinous drainage noted. The wound margin is distinct with the outline attached to the wound base. There is large (67-100%) red granulation within the wound bed. There is no necrotic tissue within the wound bed. 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 - with Dr. Dellia Nims Bathing/ Shower/ Hygiene: May shower and wash wound with soap and water. - on days that dressing is changed Edema  Control - Lymphedema / SCD / Other: Elevate legs to the level of the heart or above for 30 minutes daily and/or when sitting, [name] frequency of: - throughout the day Compression stocking or Garment 30-40 mm/Hg pressure to: - Juxtalite to both legs daily Off-Loading: Roho cushion for wheelchair Turn and reposition every 2 hours WOUND #41R: - Ischium Wound Laterality: Left Cleanser: Soap and Water Every Other Day/30 Days Discharge Instructions: May shower and wash wound with dial antibacterial soap and water prior to dressing change. Peri-Wound Care: Triamcinolone 15 (g) Every Other Day/30 Days Discharge Instructions: Use triamcinolone 15 (g) mixed with zinc oxide Peri-Wound Care: Zinc Oxide Ointment 30g tube Every Other Day/30 Days Discharge Instructions: Apply Zinc Oxide mixed with Triamcinolone to periwound with each dressing change Prim Dressing: Maxorb Extra Calcium Alginate Dressing, 4x4 in Every Other Day/30 Days ary Discharge Instructions: Apply calcium alginate to wound bed as instructed Secondary Dressing: ComfortFoam Border, 4x4 in (silicone border) Every Other Day/30 Days Discharge Instructions: Apply over primary dressing as directed. WOUND #51: - Lower Leg Wound Laterality: Left, Anterior Cleanser: Soap and Water Every Other Day/30 Days Discharge Instructions: May shower and wash wound with dial antibacterial soap and water prior to dressing change. Topical: Ketoconazole Cream 2% Every Other Day/30 Days Discharge Instructions: Apply Ketoconazole to periwound. Pt to apply Lotrimin cream at home. Prim Dressing: Hydrofera Blue Ready Foam, 4x5 in Every Other Day/30 Days ary Discharge Instructions: Apply to wound bed as instructed Secondary Dressing: ComfortFoam Border, 4x4 in (silicone border) Every Other Day/30 Days Discharge Instructions: Apply over primary dressing as directed. WOUND #52: - Foot Wound Laterality: Dorsal, Right Cleanser: Soap and Water Every Other Day/30  Days Discharge Instructions: May shower and wash wound with dial antibacterial soap and water prior to dressing change. Prim Dressing: Hydrofera Blue Ready Foam, 2.5 x2.5 in Every Other Day/30 Days ary Discharge Instructions: Apply to wound bed as instructed Secondary Dressing:  Woven Gauze Sponge, Non-Sterile 4x4 in Every Other Day/30 Days Discharge Instructions: Apply over primary dressing as directed. Secondary Dressing: Optifoam Non-Adhesive Dressing, 4x4 in Every Other Day/30 Days Discharge Instructions: Apply foam to dorsal ankle for protection Secured With: Kerlix Roll Sterile, 4.5x3.1 (in/yd) Every Other Day/30 Days Discharge Instructions: Secure with Kerlix as directed. Secured With: 26M Medipore H Soft Cloth Surgical T ape, 2x2 (in/yd) Every Other Day/30 Days Discharge Instructions: Secure dressing with tape as directed. 1. I am going to use Lotrisone on the skin of his buttock. With zinc oxide calcium alginate as the primary dressing. I wondered about [name] chronic contact dermatitis with either ABDs or border foam. He does not have [name] similar area on the right buttock or I might consider this I will pressure, friction and moisture 2. Also going to lose Lotrisone on the left anterior lower leg again with calcium alginate 3. Right dorsal foot we will continue with the Hydrofera Blue 4. At his original wound on the left anterior lower leg which I think was mostly venous insufficiency remains closed Electronic Signature(s) Signed: 04/27/2021 4:50:54 PM By: Linton Ham MD Entered By: Linton Ham on 04/27/2021 09:19:30 -------------------------------------------------------------------------------- SuperBill Details Patient Name: Date of Service: Albright, [name] [name] 04/27/2021 Medical Record Number: [id number] Patient Account Number: [id number] Date of Birth/Sex: Treating RN: [birth year] (35 y.o. 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 Facility Procedures CPT4 Code: [id number] Description: 99214 - WOUND CARE VISIT-LEV 4 EST PT Modifier: Quantity: 1 Physician Procedures : CPT4 Code Description Modifier [id number] - WC PHYS LEVEL 3 - EST PT ICD-10 Diagnosis Description 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

## 2021-05-11 ENCOUNTER — Other Ambulatory Visit (HOSPITAL_COMMUNITY)
Admit: 2021-05-11 | Discharge: 2021-05-11 | Disposition: A | Discharge status: Home / Self Care | Payer: 59 | Source: Ambulatory Visit | Attending: Internal Medicine | Admitting: Internal Medicine

## 2021-05-11 ENCOUNTER — Encounter (HOSPITAL_BASED_OUTPATIENT_CLINIC_OR_DEPARTMENT_OTHER): Payer: 59 | Admitting: Internal Medicine

## 2021-05-11 ENCOUNTER — Other Ambulatory Visit: Payer: Self-pay

## 2021-05-11 DIAGNOSIS — I87332 Chronic venous hypertension (idiopathic) with ulcer and inflammation of left lower extremity: Secondary | ICD-10-CM | POA: Diagnosis not present

## 2021-05-11 DIAGNOSIS — L97821 Non-pressure chronic ulcer of other part of left lower leg limited to breakdown of skin: Secondary | ICD-10-CM | POA: Insufficient documentation

## 2021-05-11 NOTE — Progress Notes (Signed)
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 not exactly sure of the issue here. 07/21/17; better surface today after 1 week of Iodoflex. Significant cellulitis that we treated last week also better. [Doxycycline] 07/28/17 better surface today with now 2 [name] of Iodoflex.  Significant cellulitis treated with doxycycline. He has now completed the doxycycline and he is back to his usual degree of chronic venous inflammation/stasis dermatitis. He reminds me he has had ablations surgery here 08/04/17; continued improvement with Iodoflex to the left lateral leg wound in terms of the surface of the wound although the dimensions are better. 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 Problematic areas on the plantar aspect of the first second toe web space on the left and the dorsal aspect of the first second toe web space on the right. At one point I felt these were probably related to chronic fungal infections in treated him aggressively for this although we have not made any improvement here. 08/11/17; left lateral leg. Surface continues to improve with the Iodoflex although we are not seeing much improvement in overall wound dimensions. Areas on his plantar left foot and right foot show no improvement. In fact the right foot looks somewhat worse 08/18/17; left lateral leg. We changed to [facility] Blue last week after [name] prolonged course of Iodoflex which helps get the surface better. It appears that the wound with is improved. Continue with difficult areas on the left dorsal first second and plantar first second on the right  09/01/17; patient arrives in clinic today having had [name] temperature of 103 yesterday. He was seen in the ER and [facility]. The patient was concerned he could have cellulitis again in the right leg however they diagnosed him with [name] UTI and he is now on Keflex. He has [name] history of cellulitis which is been recurrent and difficult but this is been in the left leg, in the past 5 use doxycycline. He does in and out catheterizations at home which are risk factors for UTI 09/08/17; patient will be completing his Keflex this weekend. The erythema on the left leg is considerably better. He has [name] new  wound today on the medial part of the right leg small superficial almost looks like [name] skin tear. He has worsening of the area on the right dorsal first and second toe. His major area on the left lateral leg is better. Using Hydrofera Blue on all areas 09/15/17; gradual reduction in width on the long wound in the left lateral leg. No debridement required. He also has wounds on the plantar aspect of his left first second toe web space and on the dorsal aspect of the right first second toe web space. 09/22/17; there continues to be very gradual improvements in the dimensions of the left lateral leg wound. He hasn't round erythematous spot with might be pressure on his wheelchair. There is no evidence obviously of infection no purulence no warmth He has [name] dry scaled area on the plantar aspect of the left first second toe Improved area on the dorsal right first second toe. 09/29/17; left lateral leg wound continues to improve in dimensions mostly with an is still [name] fairly long but increasingly narrow wound. He has [name] dry scaled area on the plantar aspect of his left first second toe web space Increasingly concerning area on the dorsal right first second toe. In fact I am concerned today about possible cellulitis around this wound. The areas extending up his second toe and although there is deformities here almost appears to abut on the nailbed. 10/06/17; left lateral leg wound continues to make very gradual progress. Tissue culture I did from the right first second toe dorsal foot last time grew MRSA and enterococcus which was vancomycin sensitive. This was not sensitive to clindamycin or doxycycline. He is allergic to Zyvox and sulfa we have therefore arrange for him to have dalvance infusion tomorrow. He is had this in the past and tolerated it well 10/20/17; left lateral leg wound continues to make decent progress. This is certainly reduced in terms of with there is advancing epithelialization.The  cellulitis in the right foot looks better although he still has [name] deep wound in the dorsal aspect of the first second toe web space. Plantar left first toe web space on the left I think is making some progress 10/27/17; left lateral leg wound continues to make decent progress. Advancing epithelialization.using Hydrofera Blue The right first second toe web space wound is better-looking using silver alginate Improvement in the left plantar first second toe web space. Again using silver alginate 11/03/17 left lateral leg wound continues to make decent progress albeit slowly. Using [facility] The right per second toe web space continues to be [name] very problematic looking punched out wound. I obtained [name] piece of tissue for deep culture I did extensively treated this for fungus. It is difficult to imagine that this is [name] pressure area as the patient states other than going outside he doesn't really wear shoes at home The  left plantar first second toe web space looked fairly senescent. Necrotic edges. This required debridement change to [facility] Blue to all wound areas 11/10/17; left lateral leg wound continues to contract. Using Hydrofera Blue On the right dorsal first second toe web space dorsally. Culture I did of this area last week grew MRSA there is not an easy oral option in this patient was multiple antibiotic allergies or intolerances. This was only [name] rare culture isolate I'm therefore going to use Bactroban under silver alginate On the left plantar first second toe web space. Debridement is required here. This is also unchanged 11/17/17; left lateral leg wound continues to contract using Hydrofera Blue this is no longer the major issue. The major concern here is the right first second toe web space. He now has an open area going from dorsally to the plantar aspect. There is now wound on the inner lateral part of the first toe. Not [name] very viable surface on this. There is erythema spreading  medially into the forefoot. No major change in the left first second toe plantar wound 11/24/17; left lateral leg wound continues to contract using Hydrofera Blue. Nice improvement today The right first second toe web space all of this looks [name] lot less angry than last week. I have given him clindamycin and topical Bactroban for MRSA and terbinafine for the possibility of underlining tinea pedis that I could not control with ketoconazole. Looks somewhat better The area on the plantar left first second toe web space is weeping with dried debris around the wound 12/01/17; left lateral leg wound continues to contract he Hydrofera Blue. It is becoming thinner in terms of with nevertheless it is making good improvement. The right first second toe web space looks less angry but still [name] large necrotic-looking wounds starting on the plantar aspect of the right foot extending between the toes and now extensively on the base of the right second toe. I gave him clindamycin and topical Bactroban for MRSA anterior benefiting for the possibility of underlying tinea pedis. Not looking better today The area on the left first/second toe looks better. Debrided of necrotic debris 12/05/17* the patient was worked in urgently today because over the weekend he found blood on his incontinence bad when he woke up. He was found to have an ulcer by his wife who does most of his wound care. He came in today for [place] to look at this. He has not had [name] history of wounds in his buttocks in spite of his paraplegia. 12/08/17; seen in follow-up today at his usual appointment. He was seen earlier this week and found to have [name] new wound on his buttock. We also follow him for wounds on the left lateral leg, left first second toe web space and right first second toe web space 12/15/17; we have been using Hydrofera Blue to the left lateral leg which has improved. The right first second toe web space has also improved. Left first second toe web  space plantar aspect looks stable. The left buttock has worsened using Santyl. Apparently the buttock has drainage 12/22/17; we have been using Hydrofera Blue to the left lateral leg which continues to improve now 2 small wounds separated by normal skin. He tells [place] he had [name] fever up to 100 yesterday he is prone to UTIs but has not noted anything different. He does in and out catheterizations. The area between the first and second toes today does not look good necrotic surface covered with what looks  to be purulent drainage and erythema extending into the third toe. I had gotten this to something that I thought look better last time however it is not look good today. He also has [name] necrotic surface over the buttock wound which is expanded. I thought there might be infection under here so I removed [name] lot of the surface with [name] #5 curet though nothing look like it really needed culturing. He is been using Santyl to this area 12/27/17; his original wound on the left lateral leg continues to improve using Hydrofera Blue. I gave him samples of Baxdella although he was unable to take them out of fear for an allergic reaction ["lump in his throat"].the culture I did of the purulent drainage from his second toe last week showed both enterococcus and [name] set Enterobacter I was also concerned about the erythema on the bottom of his foot although paradoxically although this looks somewhat better today. 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 We've been using silver alginate here. They came up today that he sleeps in [name] recliner, I'm not sure why but I asked him to stop this 01/03/18; his original wound we've been using Hydrofera Blue is now separated into 2 areas. Ulcer on his left buttock is better he is off the recliner and sleeping in bed Finally both wound areas between his first and second toes also looks some  better 01/10/18; his original wound on the left lateral leg is now separated into 2 wounds we've been using Hydrofera Blue Ulcer on his left buttock has some drainage. There is [name] small probing site going into muscle layer superiorly.using silver alginate -He arrives today with [name] deep tissue injury on the left heel The wound on the dorsal aspect of his first second toe on the left looks [name] lot betterusing silver alginate ketoconazole The area on the first second toe web space on the right also looks [name] lot bette 01/17/18; his original wound on the left lateral leg continues to progress using Hydrofera Blue Ulcer on his left buttock also is smaller surface healthier except for [name] small probing site going into the muscle layer superiorly. 2.4 cm of tunneling in this area DTI on his left heel we have only been offloading. Looks better than last week no threatened open no evidence of infection the wound on the dorsal aspect of the first second toe on the left continues to look like it's regressing we have only been using silver alginate and terbinafine orally The area in the first second toe web space on the right also looks to be [name] lot better using silver alginate and terbinafine I think this was prompted by tinea pedis 01/31/18; the patient was hospitalized in [place] last week apparently for [name] complicated UTI. He was discharged on cefepime he does in and out catheterizations. In the hospital he was discovered M I don't mild elevation of AST and ALT and the terbinafine was stopped.predictably the pressure ulcer on s his buttock looks betterusing silver alginate. The area on the left lateral leg also is better using Hydrofera Blue. The area between the first and second toes on the left better. First and second toes on the right still substantial but better. Finally the DTI on the left heel has held together and looks like it's resolving 02/07/18-he is here in follow-up evaluation for multiple ulcerations.  He has new injury to the lateral aspect of  the last issue [name] pressure ulcer, he states this is from adhesive removal trauma. He states he has tried multiple adhesive products with no success. All other ulcers appear stable. The left heel DTI is resolving. We will continue with same treatment plan and follow-up next week. 02/14/18; follow-up for multiple areas. He has [name] new area last week on the lateral aspect of his pressure ulcer more over the posterior trochanter. The original pressure ulcer looks quite stable has healthy granulation. 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 Almost fully epithelialized on the original superior area using Hydrofera Blue DTI on the left heel has peeled off this week to reveal [name] small superficial wound under denuded skin and subcutaneous tissue Both areas between the first and second toes look better including nothing open on the left 02/21/18; The patient's wounds on his left ischial tuberosity and posterior left greater trochanter actually looked better. He has [name] large area of irritation around the area which I think is contact dermatitis. I am doubtful that this is fungal His original wound on the left lateral calf continues to improve we have been using Hydrofera Blue There is no open area in the left first second toe web space although there is [name] lot of thick callus The DTI on the left heel required debridement today of necrotic surface eschar and subcutaneous tissue using silver alginate Finally the area on the right first second toe webspace continues to contract using silver alginate and ketoconazole 02/28/18 Left ischial tuberosity wounds look better using silver alginate. Original wound on the left calf only has one small open area left using Hydrofera Blue DTI on the left heel required debridement mostly removing skin from around this wound surface. Using  silver alginate The areas on the right first/second toe web space using silver alginate and ketoconazole 03/08/18 on evaluation today patient appears to be doing decently well as best I can tell in regard to his wounds. This is the first time that I have seen him as he generally is followed by Dr. Dellia Nims. With that being said none of his wounds appear to be infected he does have an area where there is some skin covering what appears to be [name] new wound on the left dorsal surface of his great toe. This is right at the nail bed. With that being said I do believe that debrided away some of the excess skin can be of benefit in this regard. Otherwise he has been tolerating the dressing changes without complication. 03/14/18; patient arrives today with the multiplicity of wounds that we are following. He has not been systemically unwell Original wound on the left lateral calf now only has 2 small open areas we've been using Hydrofera Blue which should continue The deep tissue injury on the left heel requires debridement today. We've been using silver alginate The left first second toe and the right first second toe are both are reminiscence what I think was tinea pedis. Apparently some of the callus Surface between the toes was removed last week when it started draining. Purulent drainage coming from the wound on the ischial tuberosity on the left. 03/21/18-He is here in follow-up evaluation for multiple wounds. 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 He tolerated debridement. The only change to last week's recommendations is to discontinue antifungal cream between toes. He will follow-up next week  03/28/18; following up for multiple wounds;Concern this week is streaking redness and swelling in the right foot. He is going to need antibiotics for this. 03/31/18; follow-up for right foot cellulitis. Streaking redness and swelling in the  right foot on 03/28/18. He has multiple antibiotic intolerances and [name] history of MRSA. I put him on clindamycin 300 mg every 6 and brought him in for [name] quick check. He has an open wound between his first and second toes on the right foot as [name] potential source. 04/04/18; Right foot cellulitis is resolving he is completing clindamycin. This is truly good news Left lateral calf wound which is initial wound only has one small open area inferiorly this is close to healing out. He has compression stockings. We will use Hydrofera Blue right down to the epithelialization of this Nonviable surface on the left heel which was initially pressure with [name] DTI. We've been using Hydrofera Blue. I'm going to switch this back to silver alginate Left first second toe/tinea pedis this looks better using silver alginate Right first second toe tinea pedis using silver alginate Large pressure ulcers on theLeft ischial tuberosity. Small wound here Looks better. I am uncertain about the surface over the large wound. Using silver alginate 04/11/18; Cellulitis in the right foot is resolved Left lateral calf wound which was his original wounds still has 2 tiny open areas remaining this is just about closed Nonviable surface on the left heel is better but still requires debridement Left first second toe/tinea pedis still open using silver alginate Right first second toe wound tinea pedis I asked him to go back to using ketoconazole and silver alginate Large pressure ulcers on the left ischial tuberosity this shear injury here is resolved. Wound is smaller. No evidence of infection using silver alginate 04/18/18; Patient arrives with an intense area of cellulitis in the right mid lower calf extending into the right heel area. Bright red and warm. Smaller area on the left anterior leg. He has [name] significant history of MRSA. He will definitely need antibioticsdoxycycline He now has 2 open areas on the left ischial tuberosity the  original large wound and now [name] satellite area which I think was above his initial satellite areas. Not [name] wonderful surface on this satellite area surrounding erythema which looks like pressure related. His left lateral calf wound again his original wound is just about closed Left heel pressure injury still requiring debridement Left first second toe looks [name] lot better using silver alginate Right first second toe also using silver alginate and ketoconazole cream also looks better 04/20/18; the patient was worked in early today out of concerns with his cellulitis on the right leg. I had started him on doxycycline. This was 2 days ago. His wife was concerned about the swelling in the area. Also concerned about the left buttock. He has not been systemically unwell no fever chills. No nausea vomiting or diarrhea 04/25/18; the patient's left buttock wound is continued to deteriorate he is using Hydrofera Blue. He is still completing clindamycin for the cellulitis on the right leg although all of this looks better. 05/02/18 Left buttock wound still with [name] lot of drainage and [name] very tightly adherent fibrinous necrotic surface. He has [name] deeper area superiorly The left lateral calf wound is still closed DTI wound on the left heel necrotic surface especially the circumference using Iodoflex Areas between his left first second toe and right first second toe both look better. Dorsally and the right first second toe he  had [name] necrotic surface although at smaller. In using silver alginate and ketoconazole. I did [name] culture last week which was [name] deep tissue culture of the reminiscence of the open wound on the right first second toe dorsally. This grew [name] few Acinetobacter and [name] few methicillin-resistant staph aureus. Nevertheless the area actually this week looked better. I didn't feel the need to specifically address this at least in terms of systemic antibiotics. 05/09/18; wounds are measuring larger more drainage per  our intake. We are using Santyl covered with alginate on the large superficial buttock wounds, Iodosorb on the left heel, ketoconazole and silver alginate to the dorsal first and second toes bilaterally. 05/16/18; The area on his left buttock better in some aspects although the area superiorly over the ischial tuberosity required an extensive debridement.using Santyl Left heel appears stable. Using Iodoflex The areas between his first and second toes are not bad however there is spreading erythema up the dorsal aspect of his left foot this looks like cellulitis again. He is insensate the erythema is really very brilliant.o Erysipelas He went to see an allergist days ago because he was itching part of this he had lab work done. This showed [name] white count of 15.1 with 70% neutrophils. Hemoglobin of 11.4 and [name] platelet count of 659,000. Last white count we had in Epic was [name] 2-1/2 years ago which was 25.9 but he was ill at the time. He was able to show me some lab work that was done by his primary physician the pattern is about the same. 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 But prompted me to go ahead and do x-rays of both feet and the pelvis rule out osteomyelitis. He also had [name] comprehensive metabolic panel this was reasonably normal his albumin was 3.7 liver function tests BUN/creatinine all normal 05/23/18; x-rays of both his feet from last week were negative for underlying pulmonary abnormality. The x-ray of his pelvis however showed mild irregularity in the left ischial which may represent some early osteomyelitis. The wound in the left ischial continues to get deeper clearly now exposed muscle. Each week necrotic surface material over this area. Whereas the rest of the wounds do not look so bad. The left ischial wound we have been using Santyl and calcium alginate T the left heel surface necrotic debris using Iodoflex o The left lateral leg is still  healed Areas on the left dorsal foot and the right dorsal foot are about the same. There is some inflammation on the left which might represent contact dermatitis, fungal dermatitis I am doubtful cellulitis although this looks better than last week 05/30/18; CT scan done at Hospital did not show any osteomyelitis or abscess. Suggested the possibility of underlying cellulitis although I don't see [name] lot of evidence of this at the bedside The wound itself on the left buttock/upper thigh actually looks somewhat better. No debridement Left heel also looks better no debridement continue Iodoflex Both dorsal first second toe spaces appear better using Lotrisone. Left still required debridement 06/06/18; Intake reported some purulent looking drainage from the left gluteal wound. Using Santyl and calcium alginate Left heel looks better although still [name] nonviable surface requiring debridement The left dorsal foot first/second webspace actually expanding and somewhat deeper. I may consider doing [name] shave biopsy of this area Right dorsal foot first/second webspace appears stable to improved. Using Lotrisone and silver alginate to both these areas 06/13/18 Left gluteal surface looks  better. Now separated in the 2 wounds. No debridement required. Still drainage. We'll continue silver alginate Left heel continues to look better with Iodoflex continue this for at least another week Of his dorsal foot wounds the area on the left still has some depth although it looks better than last week. We've been using Lotrisone and silver alginate 06/20/18 Left gluteal continues to look better healthy tissue Left heel continues to look better healthy granulation wound is smaller. He is using Iodoflex and his long as this continues continue the Iodoflex Dorsal right foot looks better unfortunately dorsal left foot does not. There is swelling and erythema of his forefoot. He had minor trauma to this several days ago but doesn't  think this was enough to have caused any tissue injury. Foot looks like cellulitis, we have had this problem before 06/27/18 on evaluation today patient appears to be doing [name] little worse in regard to his foot ulcer. 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 Both of which would really be his only options for treating this infection. In the past he has been given and effusion of Orbactiv. This is done very well for him in the past again it's one time dosing IV antibiotic therapy. Subsequently I do believe this is something we're gonna need to see about doing at this point in time. Currently his other wounds seem to be doing somewhat better in my pinion I'm pretty happy in that regard. 07/03/18 on evaluation today patient's wounds actually appear to be doing fairly well. He has been tolerating the dressing changes without complication. All in all he seems to be showing signs of improvement. In regard to the antibiotics he has been dealing with infectious disease since I saw him last week as far as getting this scheduled. In the end [facility]'s [facility] this coming Friday. In the meantime he has been continuing to perform the dressing changes in such as previous. There does not appear to be any evidence of infection worsengin at this time. 07/10/18; Since I last saw this man 2 [name] ago things have actually improved. IV antibiotics of resulted in less forefoot erythema although there is still some present. He is not systemically unwell Left buttock wounds 2 now have no depth there is increased epithelialization Using silver alginate Left heel still requires debridement using Iodoflex Left dorsal foot still with [name] sizable wound about the size of [name] border but healthy granulation Right dorsal foot still with [name]  slitlike area using silver alginate 07/18/18; the patient's cellulitis in the left foot is improved in fact I think it is on its way to resolving. Left buttock wounds 2 both look better although the larger one has hypertension granulation we've been using silver alginate Left heel has some thick circumferential redundant skin over the wound edge which will need to be removed today we've been using Iodoflex Left dorsal foot is still [name] sizable wound required debridement using silver alginate The right dorsal foot is just about closed only [name] small open area remains here 07/25/18; left foot cellulitis is resolved Left buttock wounds 2 both look better. Hyper-granulation on the major area Left heel as some debris over the surface but otherwise looks [name] healthier wound. Using silver collagen Right dorsal foot is just about closed 07/31/18; arrives with  our intake nurse worried about purulent drainage from the buttock. We had hyper-granulation here last week His buttock wounds 2 continue to look better Left heel some debris over the surface but measuring smaller. Right dorsal foot unfortunately has openings between the toes Left foot superficial wound looks less aggravated. 08/07/18 Buttock wounds continue to look better although some of her granulation and the larger medial wound. silver alginate Left heel continues to look [name] lot better.silver collagen Left foot superficial wound looks less stable. Requires debridement. He has [name] new wound superficial area on the foot on the lateral dorsal foot. Right foot looks better using silver alginate without Lotrisone 08/14/2018; patient was in the ER last week diagnosed with [name] UTI. He is now on Cefpodoxime and Macrodantin. Buttock wounds continued to be smaller. Using silver alginate Left heel continues to look better using silver collagen Left foot superficial wound looks as though it is improving Right dorsal foot area is just about healed. 08/21/2018; patient is  completed his antibiotics for his UTI. He has 2 open areas on the buttocks. There is still not closed although the surface looks satisfactory. Using silver alginate Left heel continues to improve using silver collagen The bilateral dorsal foot areas which are at the base of his first and second toes/possible tinea pedis are actually stable on the left but worse on the right. The area on the left required debridement of necrotic surface. After debridement I obtained [name] specimen for PCR culture. The right dorsal foot which is been just about healed last week is now reopened 08/28/2018; culture done on the left dorsal foot showed coag negative staph both staph epidermidis and Lugdunensis. I think this is worthwhile initiating systemic treatment. I will use doxycycline given his long list of allergies. The area on the left heel slightly improved but still requiring debridement. The large wound on the buttock is just about closed whereas the smaller one is larger. Using silver alginate in this area 09/04/2018; patient is completing his doxycycline for the left foot although this continues to be [name] very difficult wound area with very adherent necrotic debris. We are using silver alginate to all his wounds right foot left foot and the small wounds on his buttock, silver collagen on the left heel. 09/11/2018; once again this patient has intense erythema and swelling of the left forefoot. Lesser degrees of erythema in the right foot. He has [name] long list of allergies and intolerances. I will reinstitute doxycycline. 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 Using silver alginate Left heel also looks better using silver collagen Unfortunately both the areas on his feet look worse. The area on the left first second webspace is now gone through to the plantar part of his foot. The area on the left foot anteriorly is irritated with erythema and swelling in the  forefoot. 09/25/2018 His wound on the left plantar heel looks better. Using silver collagen The area on the left buttock 2 small remnant areas. One is closed one is still open. Using silver alginate The areas between both his first and second toes look worse. This in spite of long-standing antifungal therapy with ketoconazole and silver alginate which should have antifungal activity He has small areas around his original wound on the left calf one is on the bottom of the original scar tissue and one superiorly both of these are small and superficial but again given wound history in this site this is  worrisome 10/02/2018 Left plantar heel continues to gradually contract using silver collagen Left buttock wound is unchanged using silver alginate The areas on his dorsal feet between his first and second toes bilaterally look about the same. I prescribed clindamycin ointment to see if we can address chronic staph colonization and also the underlying possibility of erythrasma The left lateral lower extremity wound is actually on the lateral part of his ankle. Small open area here. We have been using silver alginate 10/09/2018; Left plantar heel continues to look healthy and contract. No debridement is required Left buttock slightly smaller with [name] tape injury wound just below which was new this week Dorsal feet somewhat improved I have been using clindamycin Left lateral looks lower extremity the actual open area looks worse although [name] lot of this is epithelialized. I am going to change to silver collagen today He has [name] lot more swelling in the right leg although this is not pitting not red and not particularly warm there is [name] lot of spasm in the right leg usually indicative of people with paralysis of some underlying discomfort. We have reviewed his vascular status from 2017 he had [name] left greater saphenous vein ablation. I wonder about referring him back to vascular surgery if the area on the left  leg continues to deteriorate. 10/16/2018 in today for follow-up and management of multiple lower extremity ulcers. His left Buttock wound is much lower smaller and almost closed completely. The wound to the left ankle has began to reopen with Epithelialization and some adherent slough. He has multiple new areas to the left foot and leg. The left dorsal foot without much improvement. Wound present between left great webspace and 2nd toe. Erythema and edema present right leg. Right LE ultrasound obtained on 10/10/18 was negative for DVT . 10/23/2018; Left buttock is closed over. Still dry macerated skin but there is no open wound. I suspect this is chronic pressure/moisture Left lateral calf is quite [name] bit worse than when I saw this last. There is clearly drainage here he has macerated skin into the left plantar heel. We will change the primary dressing to alginate Left dorsal foot has some improvement in overall wound area. Still using clindamycin and silver alginate Right dorsal foot about the same as the left using clindamycin and silver alginate The erythema in the right leg has resolved. He is DVT rule out was negative Left heel pressure area required debridement although the wound is smaller and the surface is health 10/26/2018 The patient came back in for his nurse check today predominantly because of the drainage coming out of the left lateral leg with [name] recent reopening of his original wound on the left lateral calf. He comes in today with [name] large amount of surrounding erythema around the wound extending from the calf into the ankle and even in the area on the dorsal foot. He is not systemically unwell. He is not febrile. Nevertheless this looks like cellulitis. We have been using silver alginate to the area. I changed him to [name] regular visit and I am going to prescribe him doxycycline. The rationale here is [name] long list of medication intolerances and [name] history of MRSA. I did not see anything  that I thought would provide [name] valuable culture 10/30/2018 Follow-up from his appointment 4 days ago with really an extensive area of cellulitis in the left calf left lateral ankle and left dorsal foot. I put him on doxycycline. He has [name] long list of medication allergies  which are true allergy reactions. 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 In any case he is [name] lot better today. The erythema especially in the anterior and lateral left calf is better. He still has left ankle erythema. He also is complaining about increasing edema in the right leg we have only been using Kerlix Coban and he has been doing the wraps at home. Finally he has [name] spotty rash on the medial part of his upper left calf which looks like folliculitis or perhaps wrap occlusion type injury. Small superficial macules not pustules 11/06/18 patient arrives today with again [name] considerable degree of erythema around the wound on the left lateral calf extending into the dorsal ankle and dorsal foot. This is [name] lot worse than when I saw this last week. He is on doxycycline really with not [name] lot of improvement. He has not been systemically unwell Wounds on the; left heel actually looks improved. Original area on the left foot and proximity to the first and second toes looks about the same. He has superficial areas on the dorsal foot, anterior calf and then the reopening of his original wound on the left lateral calf which looks about the same The only area he has on the right is the dorsal webspace first and second which is smaller. He has [name] large area of dry erythematous skin on the left buttock small open area here. 11/13/2018; the patient arrives in much better condition. The erythema around the wound on the left lateral calf is [name] lot better. 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 In any case this is [name] lot better. The area on the left heel is very small and just about resolved using silver collagen we have been using silver alginate to the areas on his dorsal feet 11/20/2018; his wounds include the left lateral calf, left heel, dorsal aspects of both feet just proximal to the first second webspace. He is stable to slightly improved. I did not think any changes to his dressings were going to be necessary 11/27/2018 he has [name] reopening on the left buttock which is surrounded by what looks like tinea or perhaps some other form of dermatitis. The area on the left dorsal foot has some erythema around it I have marked this area but I am not sure whether this is cellulitis or not. Left heel is not closed. Left calf the reopening is really slightly longer and probably worse 1/13; in general things look better and smaller except for the left dorsal foot. Area on the left heel is just about closed, left buttock looks better only [name] small wound remains in the skin looks better [using Lotrisone] 1/20; the area on the left heel only has [name] few remaining open areas here. Left lateral calf about the same in terms of size, left dorsal foot slightly larger right lateral foot still not closed. The area on the left buttock has no open wound and the surrounding skin looks [name] lot better 1/27; the area on the left heel is closed. Left lateral calf better but still requiring extensive debridements. The area on his left buttock is closed. He still has the open areas on the left dorsal foot which is slightly smaller in the right foot which is slightly expanded. We have been using Iodoflex on these areas as well 2/3; left heel is  closed. Left lateral calf still requiring debridement using Iodoflex there is no open area on his left buttock however he has dry scaly skin over [name] large area of this. Not really responding well to the Lotrisone. Finally the areas on his dorsal feet at the level of the  first second webspace are slightly smaller on the right and about the same on the left. Both of these vigorously debrided with Anasept and gauze 2/10; left heel remains closed he has dry erythematous skin over the left buttock but there is no open wound here. Left lateral leg has come in and with. Still requiring debridement we have been using Iodoflex here. Finally the area on the left dorsal foot and right dorsal foot are really about the same extremely dry callused fissured areas. He does not yet have [name] dermatology appointment 2/17; left heel remains closed. He has [name] new open area on the left buttock. The area on the left lateral calf is bigger longer and still covered in necrotic debris. No major change in his foot areas bilaterally. I am awaiting for [name] dermatologist to look on this. We have been using ketoconazole I do not know that this is been doing any good at all. 2/24; left heel remains closed. The left buttock wound that was new reopening last week looks better. The left lateral calf appears better also although still requires debridement. The major area on his foot is the left first second also requiring debridement. We have been putting Prisma on all wounds. I do not believe that the ketoconazole has done too much good for his feet. He will use Lotrisone I am going to give him [name] 2-week course of terbinafine. We still do not have [name] dermatology appointment 3/2 left heel remains closed however there is skin over bone in this area I pointed this out to him today. The left buttock wound is epithelialized but still does not look completely stable. The area on the left leg required debridement were using silver collagen here. With regards to his feet we changed to Lotrisone last week and silver alginate. 3/9; left heel remains closed. Left buttock remains closed. The area on the right foot is essentially closed. The left foot remains unchanged. Slightly smaller on the left lateral calf. Using  silver collagen to both of these areas 3/16-Left heel remains closed. Area on right foot is closed. Left lateral calf above the lateral malleolus open wound requiring debridement with easy bleeding. Left dorsal wound proximal to first toe also debrided. Left ischial area open new. Patient has been using Prisma with wrapping every 3 days. Dermatology appointment is apparently tomorrow.Patient has completed his terbinafine 2-week course with some apparent improvement according to him, there is still flaking and dry skin in his foot on the left 3/23; area on the right foot is reopened. The area on the left anterior foot is about the same still [name] very necrotic adherent surface. He still has the area on the left leg and reopening is on the left buttock. He apparently saw dermatology although I do not have [name] note. According to the patient who is usually fairly well informed they did not have any good ideas. Put him on oral terbinafine which she is been on before. 3/30; using silver collagen to all wounds. Apparently his dermatologist put him on doxycycline and rifampin presumably some culture grew staph. I do not have this result. He remains on terbinafine although I have used terbinafine on him before 4/6; patient has had  [name] fairly substantial reopening on the right foot between the first and second toes. He is finished his terbinafine and I believe is on doxycycline and rifampin still as prescribed by dermatology. We have been using silver collagen to all his wounds although the patient reports that he thinks silver alginate does better on the wounds on his buttock. 4/13; the area on his left lateral calf about the same size but it did not require debridement. Left dorsal foot just proximal to the webspace between the first and second toes is about the same. Still nonviable surface. I note some superficial bronze discoloration of the dorsal part of his foot Right dorsal foot just proximal to the first  and second toes also looks about the same. I still think there may be the same discoloration I noted above on the left Left buttock wound looks about the same 4/20; left lateral calf appears to be gradually contracting using silver collagen. 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 The left dorsal foot wound is debrided of tightly adherent necrotic debris and really cleans up quite nicely. The right area is worse with expansion. I did not debride this it is now over the base of the second toe The area on his left buttock is smaller no debridement is required using silver collagen 5/4; left calf continues to make good progress. He arrives with erythema around the wounds on his dorsal foot which even extends to the plantar aspect. Very concerning for coexistent infection. He is finished the erythromycin I gave him for possible erythrasma this does not seem to have helped. The area on the left foot is about the same base of the dorsal toes Is area on the buttock looks improved on the left 5/11; left calf and left buttock continued to make good progress. Left foot is about the same to slightly improved. Major problem is on the right foot. He has not had an x-ray. Deep tissue culture I did last week showed both Enterobacter and E. coli. I did not change the doxycycline I put him on empirically although neither 1 of these were plated to doxycycline. He arrives today with the erythema looking worse on both the dorsal and plantar foot. Macerated skin on the bottom of the foot. he has not been systemically unwell 5/18-Patient returns at 1 week, left calf wound appears to be making some progress, left buttock wound appears slightly worse than last time, left foot wound looks slightly better, right foot redness is marginally better. X-ray of both feet show no air or evidence of osteomyelitis. Patient is finished his Omnicef and terbinafine. He  continues to have macerated skin on the bottom of the left foot as well as right 5/26; left calf wound is better, left buttock wound appears to have multiple small superficial open areas with surrounding macerated skin. X-rays that I did last time showed no evidence of osteomyelitis in either foot. He is finished cefdinir and doxycycline. I do not think that he was on terbinafine. He continues to have [name] large superficial open area on the right foot anterior dorsal and slightly between the first and second toes. I did send him to dermatology 2 months ago or so wondering about whether they would do [name] fungal scraping. I do not believe they did but did do [name] culture. We have been using silver alginate to the toe areas, he has been using antifungals at home topically either ketoconazole or Lotrisone. We are using silver  collagen on the left foot, silver alginate on the right, silver collagen on the left lateral leg and silver alginate on the left buttock 6/1; left buttock area is healed. We have the left dorsal foot, left lateral leg and right dorsal foot. We are using silver alginate to the areas on both feet and silver collagen to the area on his left lateral calf 6/8; the left buttock apparently reopened late last week. He is not really sure how this happened. He is tolerating the terbinafine. Using silver alginate to all wounds 6/15; left buttock wound is larger than last week but still superficial. Came in the clinic today with [name] report of purulence from the left lateral leg I did not identify any infection Both areas on his dorsal feet appear to be better. He is tolerating the terbinafine. Using silver alginate to all wounds 6/22; left buttock is about the same this week, left calf quite [name] bit better. His left foot is about the same however he comes in with erythema and warmth in the right forefoot once again. Culture that I gave him in the beginning of May showed Enterobacter and E. coli. I gave him  doxycycline and things seem to improve although neither 1 of these organisms was specifically plated. 6/29; left buttock is larger and dry this week. Left lateral calf looks to me to be improved. Left dorsal foot also somewhat improved right foot completely unchanged. The erythema on the right foot is still present. He is completing the Ceftin dinner that I gave him empirically [see discussion above.) 7/6 - All wounds look to be stable and perhaps improved, the left buttock wound is slightly smaller, per patient bleeds easily, completed ceftin, the right foot redness is less, he is on terbinafine 7/13; left buttock wound about the same perhaps slightly narrower. Area on the left lateral leg continues to narrow. Left dorsal foot slightly smaller right foot about the same. We are using silver alginate on the right foot and Hydrofera Blue to the areas on the left. Unna boot on the left 2 layer compression on the right 7/20; left buttock wound absolutely the same. Area on lateral leg continues to get better. Left dorsal foot require debridement as did the right no major change in the 7/27; left buttock wound the same size necrotic debris over the surface. The area on the lateral leg is closed once again. His left foot looks better right foot about the same although there is some involvement now of the posterior first second toe area. He is still on terbinafine which I have given him for [name] month, not certain [name] centimeter major change 06/25/19-All wounds appear to be slightly improved according to report, left buttock wound looks clean, both foot wounds have minimal to no debris the right dorsal foot has minimal slough. We are using Hydrofera Blue to the left and silver alginate to the right foot and ischial wound. 8/10-Wounds all appear to be around the same, the right forefoot distal part has some redness which was not there before, however the wound looks clean and small. Ischial wound looks about the  same with no changes 8/17; his wound on the left lateral calf which was his original chronic venous insufficiency wound remains closed. Since I last saw him the areas on the left dorsal foot right dorsal foot generally appear better but require debridement. The area on his left initial tuberosity appears somewhat larger to me perhaps hyper granulated and bleeds very easily. We have been  using Hydrofera Blue to the left dorsal foot and silver alginate to everything else 8/24; left lateral calf remains closed. The areas on his dorsal feet on the webspace of the first and second toes bilaterally both look better. The area on the left buttock which is the pressure ulcer stage II slightly smaller. I change the dressing to Hydrofera Blue to all areas 8/31; left lateral calf remains closed. The area on his dorsal feet bilaterally look better. Using Hydrofera Blue. Still requiring debridement on the left foot. No change in the left buttock pressure ulcers however 9/14; left lateral calf remains closed. Dorsal feet look quite [name] bit better than 2 [name] ago. Flaking dry skin also [name] lot better with the ammonium lactate I gave him 2 [name] ago. The area on the left buttock is improved. He states that his Roho cushion developed [name] leak and he is getting [name] new one, in the interim he is offloading this vigorously 9/21; left calf remains closed. Left heel which was [name] possible DTI looks better this week. He had macerated tissue around the left dorsal foot right foot looks satisfactory and improved left buttock wound. I changed his dressings to his feet to silver alginate bilaterally. Continuing Hydrofera Blue on the left buttock. 9/28 left calf remains closed. Left heel did not develop anything [possible DTI] dry flaking skin on the left dorsal foot. Right foot looks satisfactory. Improved left buttock wound. We are using silver alginate on his feet Hydrofera Blue on the buttock. I have asked him to go back to the  Lotrisone on his feet including the wounds and surrounding areas 10/5; left calf remains closed. The areas on the left and right feet about the same. [name] lot of this is epithelialized however debris over the remaining open areas. He is using Lotrisone and silver alginate. The area on the left buttock using Hydrofera Blue 10/26. Patient has been out for 3 [name] secondary to Covid concerns. He tested negative but I think his wife tested positive. He comes in today with the left foot substantially worse, right foot about the same. Even more concerning he states that the area on his left buttock closed over but then reopened and is considerably deeper in one aspect than it was before [stage III wound] 11/2; left foot really about the same as last week. Quarter sized wound on the dorsal foot just proximal to the first second toes. Surrounding erythema with areas of denuded epithelium. This is not really much different looking. Did not look like cellulitis this time however. Right foot area about the same.. We have been using silver alginate alginate on his toes Left buttock still substantial irritated skin around the wound which I think looks somewhat better. We have been using Hydrofera Blue here. 11/9; left foot larger than last week and [name] very necrotic surface. Right foot I think is about the same perhaps slightly smaller. Debris around the circumference also addressed. Unfortunately on the left buttock there is been [name] decline. Satellite lesions below the major wound distally and now [name] an additional one posteriorly we have been using Hydrofera Blue but I think this is [name] pressure issue 11/16; left foot ulcer dorsally again [name] very adherent necrotic surface. Right foot is about the same. Not much change in the pressure ulcer on his left buttock. 11/30; left foot ulcer dorsally basically the same as when I saw him 2 [name] ago. Very adherent fibrinous debris on the wound surface. Patient reports [name] lot  of  drainage as well. 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 We have been using Iodoflex, patient changed back to alginate because of the drainage. Area on his right dorsal foot really looks benign with [name] healthier surface certainly [name] lot better than on the left. Left buttock wounds all improved using Hydrofera Blue 12/7; left dorsal foot again no improvement. Tightly adherent debris. PCR culture I did last week only showed likely skin contaminant. I have gone ahead and done [name] punch biopsy of this which is about the last thing in terms of investigations I can think to do. He has known venous insufficiency and venous hypertension and this could be the issue here. The area on the right foot is about the same left buttock slightly worse according to our intake nurse secondary to [facility] Blue sticking to the wound 12/14; biopsy of the left foot that I did last time showed changes that could be related to wound healing/chronic stasis dermatitis phenomenon no neoplasm. We have been using silver alginate to both feet. I change the one on the left today to Sorbact and silver alginate to his other 2 wounds 12/28; the patient arrives with the following problems; Major issue is the dorsal left foot which continues to be [name] larger deeper wound area. Still with [name] completely nonviable surface Paradoxically the area mirror image on the right on the right dorsal foot appears to be getting better. He had some loss of dry denuded skin from the lower part of his original wound on the left lateral calf. Some of this area looked [name] little vulnerable and for this reason we put him in wrap that on this side this week The area on his left buttock is larger. He still has the erythematous circular area which I think is [name] combination of pressure, sweat. This does not look like cellulitis or fungal dermatitis 11/26/2019; -Dorsal left foot large open wound with depth. Still  debris over the surface. Using Sorbact The area on the dorsal right foot paradoxically has closed over He has [name] reopening on the left ankle laterally at the base of his original wound that extended up into the calf. This appears clean. The left buttock wound is smaller but with very adherent necrotic debris over the surface. We have been using silver alginate here as well The patient had arterial studies done in 2017. He had biphasic waveforms at the dorsalis pedis and posterior tibial bilaterally. ABI in the left was 1.17. Digit waveforms were dampened. He has slight spasticity in the great toes I do not think [name] TBI would be possible 1/11; the patient comes in today with [name] sizable reopening between the first and second toes on the right. This is not exactly in the same location where we have been treating wounds previously. According to our intake nurse this was actually fairly deep but 0.6 cm. The area on the left dorsal foot looks about the same the surface is somewhat cleaner using Sorbact, his MRI is in 2 days. We have not managed yet to get arterial studies. The new reopening on the left lateral calf looks somewhat better using alginate. The left buttock wound is about the same using alginate 1/18; the patient had his ARTERIAL studies which were quite normal. ABI in the right at 1.13 with triphasic/biphasic waveforms on the left ABI 1.06 again with triphasic/biphasic waveforms. It would not have been possible to have done [name] toe brachial index because of spasticity. We have  been using Sorbac to the left foot alginate to the rest of his wounds on the right foot left lateral calf and left buttock 1/25; arrives in clinic with erythema and swelling of the left forefoot worse over the first MTP area. This extends laterally dorsally and but also posteriorly. Still has an area on the left lateral part of the lower part of his calf wound it is eschared and clearly not closed. Area on the left buttock  still with surrounding irritation and erythema. Right foot surface wound dorsally. The area between the right and first and second toes appears better. 2/1; The left foot wound is about the same. Erythema slightly better I gave him [name] week of doxycycline empirically Right foot wound is more extensive extending between the toes to the plantar surface Left lateral calf really no open surface on the inferior part of his original wound however the entire area still looks vulnerable Absolutely no improvement in the left buttock wound required debridement. 2/8; the left foot is about the same. Erythema is slightly improved I gave him clindamycin last week. Right foot looks better he is using Lotrimin and silver alginate He has [name] breakdown in the left lateral calf. Denuded epithelium which I have removed Left buttock about the same were using Hydrofera Blue 2/15; left foot is about the same there is less surrounding erythema. Surface still has tightly adherent debris which I have debriding however not making any progress Right foot has [name] substantial wound on the medial right second toe between the first and second webspace. Still an open area on the left lateral calf distal area. Buttock wound is about the same 2/22; left foot is about the same less surrounding erythema. Surface has adherent debris. Polymen Ag Right foot area significant wound between the first and second toes. We have been using silver alginate here Left lateral leg polymen Ag at the base of his original venous insufficiency wound Left buttock some improvement here 3/1; Right foot is deteriorating in the first second toe webspace. Larger and more substantial. We have been using silver alginate. Left dorsal foot about the same markedly adherent surface debris using PolyMem Ag Left lateral calf surface debris using PolyMem AG Left buttock is improved again using PolyMem Ag. He is completing his terbinafine. The erythema in the foot  seems better. He has been on this for 2 [name] 3/8; no improvement in any wound area in fact he has [name] small open area on the dorsal midfoot which is new this week. He has not gotten his foot x-rays yet 3/15; his x-rays were both negative for osteomyelitis of both feet. No major change in any of his wounds on the extremities however his buttock wounds are better. We have been using polymen on the buttocks, left lower leg. Iodoflex on the left foot and silver alginate on the right 3/22; arrives in clinic today with the 2 major issues are the improvement in the left dorsal foot wound which for once actually looks healthy with [name] nice healthy wound surface without debridement. Using Iodoflex here. Unfortunately on the left lateral calf which is in the distal part of his original wound he came to the clinic here for there was purulent drainage noted some increased breakdown scattered around the original area and [name] small area proximally. We we are using polymen here will change to silver alginate today. His buttock wound on the left is better and I think the area on the right first second toe webspace is  also improved 3/29; left dorsal foot looks better. Using Iodoflex. Left ankle culture from deterioration last time grew E. coli, Enterobacter and Enterococcus. I will give him [name] course of cefdinir although that will not cover Enterococcus. The area on the right foot in the webspace of the first and second toe lateral first toe looks better. The area on his buttock is about healed Vascular appointment is on April 21. This is to look at his venous system vis--vis continued breakdown of the wounds on the left including the left lateral leg and left dorsal foot he. He has had previous ablations on this side 4/5; the area between the right first and second toes lateral aspect of the first toe looks better. Dorsal aspect of the left first toe on the left foot also improved. Unfortunately the left lateral lower leg  is larger and there is [name] second satellite wound superiorly. The usual superficial abrasions on the left buttock overall better but certainly not closed 4/12; the area between the right first and second toes is improved. Dorsal aspect of the left foot also slightly smaller with [name] vibrant healthy looking surface. No real change in the left lateral leg and the left buttock wound is healed He has an unaffordable co-pay for Apligraf. Appointment with vein and vascular with regards to the left leg venous part of the circulation is on 4/21 4/19; we continue to see improvement in all wound areas. Although this is minor. He has his vascular appointment on 4/21. The area on the left buttock has not reopened although right in the center of this area the skin looks somewhat threatened 4/26; the left buttock is unfortunately reopened. In general his left dorsal foot has [name] healthy surface and looks somewhat smaller although it was not measured as such. The area between his first and second toe webspace on the right as [name] small wound against the first toe. The patient saw vascular surgery. The real question I was asking was about the small saphenous vein on the left. He has previously ablated left greater saphenous vein. Nothing further was commented on on the left. Right greater saphenous vein without reflux at the saphenofemoral junction or proximal thigh there was no indication for ablation of the right greater saphenous vein duplex was negative for DVT bilaterally. They did not think there was anything from [name] point of view that could be offered. They ABIs within normal limits 5/3; only small open area on the left buttock. The area on the left lateral leg which was his original venous reflux is now 2 wounds both which look clean. We are using Iodoflex on the left dorsal foot which looks healthy and smaller. He is down to [name] very tiny area between the right first and second toes, using  silver alginate 5/10; all of his wounds appear better. We have much better edema control in 4 layer compression on the left. This may be the factor that is allowing the left foot and left lateral calf to heal. He has external compression garments at home 04/14/20-All of his wounds are progressing well, the left forefoot is practically closed, left ischium appears to be about the same, right toe webspace is also smaller. The left lateral leg is about the same, continue using Hydrofera Blue to this, silver alginate to the ischium, Iodoflex to the toe space on the right 6/7; most of his wounds outside of the left buttock are doing well. The area on the left lateral calf and left  dorsal foot are smaller. The area on the right foot in between the first and second toe webspace is barely visible although he still says there is some drainage here is the only reason I did not heal this out. Unfortunately the area on the left buttock almost looks like he has [name] skin tear from tape. He has open wound and then [name] large flap of skin that we are trying to get adherence over an area just next to the remaining wound 6/21; 2 week follow-up. I believe is been here for nurse visits. Miraculously the area between his first and second toes on the left dorsal foot is closed over. Still open on the right first second web space. The left lateral calf has 2 open areas. Distally this is more superficial. The proximal area had [name] little more depth and required debridement of adherent necrotic material. His buttock wound is actually larger we have been using silver alginate here 6/28; the patient's area on the left foot remains closed. Still open wet area between the first and second toes on the right and also extending into the plantar aspect. We have been using silver alginate in this location. He has 2 areas on the left lower leg part of his original long wounds which I think are better. We have been using Hydrofera Blue here.  Hydrofera Blue to the left buttock which is stable 7/12; left foot remains closed. Left ankle is closed. May be [name] small area between his right first and second toes the only truly open area is on the left buttock. We have been using Hydrofera Blue here 7/19; patient arrives with marked deterioration especially in the left foot and ankle. We did not put him in [name] compression wrap on the left last week in fact he wore his juxta lite stockings on either side although he does not have an underlying stocking. He has [name] reopening on the left dorsal foot, left lateral ankle and [name] new area on the right dorsal ankle. More worrisome is the degree of erythema on the left foot extending on the lateral foot into the lateral lower leg on the left 7/26; the patient had erythema and drainage from the lateral left ankle last week. Culture of this grew MRSA resistant to doxycycline and clindamycin which are the 2 antibiotics we usually use with this patient who has multiple antibiotic allergies including linezolid, trimethoprim sulfamethoxazole. I had give him an empiric doxycycline and he comes in the area certainly looks somewhat better although it is blotchy in his lower leg. He has not been systemically unwell. He has had areas on the left dorsal foot which is [name] reopening, chronic wounds on the left lateral ankle. Both of these I think are secondary to chronic venous insufficiency. The area between his first and second toes is closed as far as I can tell. He had [name] new wrap injury on the right dorsal ankle last week. Finally he has an area on the left buttock. We have been using silver alginate to everything except the left buttock we are using Hydrofera Blue 06/30/20-Patient returns at 1 week, has been given [name] sample dose pack of NUZYRA which is [name] tetracycline derivative [omadacycline], patient has completed those, we have been using silver alginate to almost all the wounds except the left ischium where we are using  Hydrofera Blue all of them look better 8/16; since I last saw the patient he has been doing well. The area on the left buttock, left lateral  ankle and left foot are all closed today. He has completed the [place] I gave him last time and tolerated this well. He still has open areas on the right dorsal ankle and in the right first second toe area which we are using silver alginate. 8/23; we put him in his bilateral external compression stockings last week as he did not have anything open on either leg except for concerning area between the right first and second toe. He comes in today with an area on the left dorsal foot slightly more proximal than the original wound, the left lateral foot but this is actually [name] continuation of the area he had on the left lateral ankle from last time. As well he is opened up on the left buttock again. 8/30; comes in today with things looking [name] lot better. The area on the left lower ankle has closed down as has the left foot but with eschar in both areas. The area on the dorsal right ankle is also epithelialized. Very little remaining of the left buttock wound. We have been using silver alginate on all wound areas 9/13; the area in the first second toe webspace on the right has fully epithelialized. He still has some vulnerable epithelium on the right and the ankle and the dorsal foot. He notes weeping. He is using his juxta lite stocking. On the left again the left dorsal foot is closed left lateral ankle is closed. We went to the juxta lite stocking here as well. Still vulnerable in the left buttock although only 2 small open areas remain here 9/27; 2-week follow-up. We did not look at his left leg but the patient says everything is closed. He is [name] bit disturbed by the amount of edema in his left foot he is using juxta lite stockings but asking about over the toes stockings which would be 30/40, will talk to him next time. According to him there is no open wound on  either the left foot or the left ankle/calf He has an open area on the dorsal right calf which I initially point [name] wrap injury. He has superficial remaining wound on the left ischial tuberosity been using silver alginate although he says this sticks to the wound 10/5; we gave him 2-week follow-up but he called yesterday expressing some concerns about his right foot right ankle and the left buttock. He came in early. There is still no open areas on the left leg and that still in his juxta lite stocking 10/11; he only has 1 small area on the left buttock that remains measuring millimeters 1 mm. Still has the same irritated skin in this area. We recommended zinc oxide when this eventually closes and pressure relief is meticulously is he can do this. He still has an area on the dorsal part of his right first through third toes which is [name] bit irritated and still open and on the dorsal ankle near the crease of the ankle. We have been using silver alginate and using his own stocking. He has nothing open on the left leg or foot 10/25; 2-week follow-up. Not nearly as good on the left buttock as I was hoping. For open areas with 5 looking threatened small. He has the erythematous irritated chronic skin in this area. 1 area on the right dorsal ankle. He reports this area bleeds easily Right dorsal foot just proximal to the base of his toes We have been using silver alginate. 11/8; 2-week follow-up. Left buttock is about the same  although I do not think the wounds are in the same location we have been using silver alginate. I have asked him to use zinc oxide on the skin around the wounds. He still has [name] small area on the right dorsal ankle he reports this bleeds easily Right dorsal foot just proximal to the base of the toes does not have anything open although the skin is very dry and scaly He has [name] new opening on the nailbed of the left great toe. Nothing on the left ankle 11/29; 3-week follow-up. Left  buttock has 2 open areas. And washing of these wounds today started bleeding easily. Suggesting very friable tissue. We have been using silver alginate. Right dorsal ankle which I thought was initially [name] wrap injury we have been using silver alginate. Nothing open between the toes that I can see. He states the area on the left dorsal toe nailbed healed after the last visit in 2 or 3 days 12/13; 3-week follow-up. His left buttock now has 3 open areas but the original 2 areas are smaller using polymen here. Surrounding skin looks better. The right dorsal ankle is closed. He has [name] small opening on the right dorsal foot at the level of the third toe. In general the skin looks better here. He is wearing his juxta lite stocking on the left leg says there is nothing open 11/24/2020; 3 [name] follow-up. His left buttock still has the 3 open areas. We have been using polymen but due to lack of response he changed to [facility] area. Surrounding skin is dry erythematous and irritated looking. There is no evidence of infection either bacterial or fungal however there is loss of surface epithelium He still has very dry skin in his foot causing irritation and erythema on the dorsal part of his toes. This is not responded to prolonged courses of antifungal simply looks dry and irritated 1/24; left buttock area still looks about the same he was unable to find the triad ointment that we had suggested. The area on the right lower leg just above the dorsal ankle has reopened and the areas on the right foot between the first second and second third toes and scaling on the bottom of the foot has been about the same for quite some time now. been using silver alginate to all wound areas 2/7; left buttock wound looked quite good although not much smaller in terms of surface area surrounding skin looks better. Only [name] few dry flaking areas on the right foot in between the first and second toes the skin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 left lateral leg looks better and it was wrapped. However in general discussion the patient  noted [name] new area on the dorsal aspect of his right second toe. The exact etiology of this is unclear but likely relates to pressure. 08/09/16 really the area on the left lateral leg did not really look that healthy today perhaps slightly larger and measurements. The area on his dorsal right second toe is improved also the left foot wound looks stable to improved 08/16/16; the area on the last lateral leg did not change any of dimensions. Post debridement with [name] curet the area looked better. Left foot wound improved and the area on the dorsal right second toe is improved 08/23/16; the area on the left lateral leg may be slightly smaller both in terms of length and width. Aggressive debridement with [name] curette afterwards the tissue appears healthier. Left foot wound appears improved in the area on the dorsal right second toe is improved 08/30/16 patient developed [name] fever over the weekend and was seen in an urgent care. Felt to have [name] UTI and put on doxycycline. He has been since changed over the phone to [facility]. After we took off the wrap on his right leg today the leg is swollen warm and erythematous, probably more likely the source of the fever 09/06/16; have been using collagen to the major left leg wound, silver alginate to the area on his anterior foot/toes 09/13/16; the areas on his anterior foot/toes on both sides appear to be virtually closed. Extensive wound on the left lateral leg perhaps slightly narrower but each visit still covered an adherent surface slough 09/16/16 patient was in for his usual Thursday nurse visit however the intake nurse noted significant erythema of his dorsal right foot. He is also running [name] low- grade fever and having increasing spasms in the right leg 09/20/16 here for cellulitis involving his right great toes and forefoot. This is [name] lot better. Still  requiring debridement on his left lateral leg. Santyl direct says he needs prior authorization. Therefore his wife cannot change this at home 09/30/16; the patient's extensive area on the left lateral calf and ankle perhaps somewhat better. Using Santyl. The area on the left toes is healed and I think the area on his right dorsal foot is healed as well. There is no cellulitis or venous inflammation involving the right leg. He is going to need compression stockings here. 10/07/16; the patient's extensive wound on the left lateral calf and ankle does not measure any differently however there appears to be less adherent surface slough using Santyl and aggressive weekly debridements 10/21/16; no major change in the area on the left lateral calf. Still the same measurement still very difficult to debridement adherent slough and nonviable subcutaneous tissue. This is not really been helped by several [name] of Santyl. Previously for 2 [name] I used Iodoflex for [name] short period. [name] prolonged course of Hydrofera Blue didn't really help. I'm not sure why I only used 2 [name] of Iodoflex on this there is no evidence of surrounding infection. He has [name] small area on the right second toe which looks as though it's progressing towards closure 10/28/16; the wounds on his toes appear to be closed. No major change in the left lateral leg wound although the surface looks somewhat better using Iodoflex. He has had previous arterial studies that were normal. He has had reflux studies and is status post ablation although I don't have any exact notes on which vein was ablated. I'll need to check the surgical record 11/04/16; he's had [name] reopening between the first and  second toe on the left and right. No major change in the left lateral leg wound. There is what appears to be cellulitis of the left dorsal foot 11/18/16 the patient was hospitalized initially in [place] and then subsequently transferred to [facility] long and was  admitted there from 11/09/16 through 11/12/16. He had developed progressive cellulitis on the right leg in spite of the doxycycline I gave him. I'd spoken to the hospitalist in [place] who was concerned about continuing leukocytosis. CT scan is what I suggested this was done which showed soft tissue swelling without evidence of osteomyelitis or an underlying abscess blood cultures were negative. At [facility] he was treated with vancomycin and Primaxin and then add an infectious disease consult. He was transitioned to Ceftaroline. He has been making progressive improvement. Overall [name] severe cellulitis of the right leg. He is been using silver alginate to her original wound on the left leg. The wounds in his toes on the right are closed there is [name] small open area on the base of the left second toe 11/26/15; the patient's right leg is much better although there is still some edema here this could be reminiscent from his severe cellulitis likely on top of some degree of lymphedema. His left anterior leg wound has less surface slough as reported by her intake nurse. Small wound at the base of the left second toe 12/02/16; patient's right leg is better and there is no open wound here. His left anterior lateral leg wound continues to have [name] healthy-looking surface. Small wound at the base of the left second toe however there is erythema in the left forefoot which is worrisome 12/16/16; is no open wounds on his right leg. We took measurements for stockings. His left anterior lateral leg wound continues to have [name] healthy-looking surface. I'm not sure where we were with the Apligraf run through his insurance. We have been using Iodoflex. He has [name] thick eschar on the left first second toe interface, I suspect this may be fungal however there is no visible open 12/23/16; no open wound on his right leg. He has 2 small areas left of the linear wound that was remaining last week. We have been using Prisma, I thought  I have disclosed this week, we can only look forward to next week 01/03/17; the patient had concerning areas of erythema last week, already on doxycycline for UTI through his primary doctor. The erythema is absolutely no better there is warmth and swelling both medially from the left lateral leg wound and also the dorsal left foot. 01/06/17- Patient is here for follow-up evaluation of his left lateral leg ulcer and bilateral feet ulcers. He is on oral antibiotic therapy, tolerating that. Nursing staff and the patient states that the erythema is improved from Monday. 01/13/17; the predominant left lateral leg wound continues to be problematic. I had put Apligraf on him earlier this month once. However he subsequently developed what appeared to be an intense cellulitis around the left lateral leg wound. I gave him Dalvance I think on 2/12 perhaps 2/13 he continues on cefdinir. The erythema is still present but the warmth and swelling is improved. I am hopeful that the cellulitis part of this control. I wouldn't be surprised if there is an element of venous inflammation as well. 01/17/17. The erythema is present but better in the left leg. His left lateral leg wound still does not have [name] viable surface buttons certain parts of this long thin wound it appears like  there has been improvement in dimensions. 01/20/17; the erythema still present but much better in the left leg. I'm thinking this is his usual degree of chronic venous inflammation. The wound on the left leg looks somewhat better. Is less surface slough 01/27/17; erythema is back to the chronic venous inflammation. The wound on the left leg is somewhat better. I am back to the point where I like to try an Apligraf once again 02/10/17; slight improvement in wound dimensions. Apligraf #2. He is completing his doxycycline 02/14/17; patient arrives today having completed doxycycline last Thursday. This was supposed to be [name] nurse visit however once again he  hasn't tense erythema from the medial part of his wound extending over the lower leg. Also erythema in his foot this is roughly in the same distribution as last time. He has baseline chronic venous inflammation however this is [name] lot worse than the baseline I have learned to accept the on him is baseline inflammation 02/24/17- patient is here for follow-up evaluation. He is tolerating compression therapy. His voicing no complaints or concerns he is here anticipating an Apligraf 03/03/17; he arrives today with an adherent necrotic surface. I don't think this is surface is going to be amenable for Apligraf's. The erythema around his wound and on the left dorsal foot has resolved he is off antibiotics 03/10/17; better-looking surface today. I don't think he can tolerate Apligraf's. He tells me he had [name] wound VAC after [name] skin graft years ago to this area and they had difficulty with [name] seal. The erythema continues to be stable around this some degree of chronic venous inflammation but he also has recurrent cellulitis. We have been using Iodoflex 03/17/17; continued improvement in the surface and may be small changes in dimensions. Using Iodoflex which seems the only thing that will control his surface 03/24/17- He is here for follow up evaluation of his LLE lateral ulceration and ulcer to right dorsal foot/toe space. He is voicing no complaints or concerns, He is tolerating compression wrap. 03/31/17 arrives today with [name] much healthier looking wound on the left lower extremity. We have been using Iodoflex for [name] prolonged period of time which has for the first time prepared and adequate looking wound bed although we have not had much in the way of wound dimension improvement. He also has [name] small wound between the first and second toe on the right 04/07/17; arrives today with [name] healthy-looking wound bed and at least the top 50% of this wound appears to be now her. No debridement was required I have changed him to  [facility] last week after prolonged Iodoflex. He did not do well with Apligraf's. We've had [name] re-opening between the first and second toe on the right 04/14/17; arrives today with [name] healthier looking wound bed contractions and the top 50% of this wound and some on the lesser 50%. Wound bed appears healthy. The area between the first and second toe on the right still remains problematic 04/21/17; continued very gradual improvement. Using [facility] 04/28/17; continued very gradual improvement in the left lateral leg venous insufficiency wound. His periwound erythema is very mild. We have been using Hydrofera Blue. Wound is making progress especially in the superior 50% 05/05/17; he continues to have very gradual improvement in the left lateral venous insufficiency wound. Both in terms with an length rings are improving. I debrided this every 2 [name] with #5 curet and we have been using Hydrofera Blue and again making good progress With regards  to the wounds between his right first and second toe which I thought might of been tinea pedis he is not making as much progress very dry scaly skin over the area. Also the area at the base of the left first and second toe in [name] similar condition 05/12/17; continued gradual improvement in the refractory left lateral venous insufficiency wound on the left. Dimension smaller. Surface still requiring debridement using Hydrofera Blue 05/19/17; continued gradual improvement in the refractory left lateral venous ulceration. Careful inspection of the wound bed underlying rumination suggested some degree of epithelialization over the surface no debridement indicated. Continue Hydrofera Blue difficult areas between his toes first and third on the left than first and second on the right. I'm going to change to silver alginate from silver collagen. Continue ketoconazole as I suspect underlying tinea pedis 05/26/17; left lateral leg venous insufficiency wound. We've been  using Hydrofera Blue. I believe that there is expanding epithelialization over the surface of the wound albeit not coming from the wound circumference. This is [name] bit of an odd situation in which the epithelialization seems to be coming from the surface of the wound rather than in the exact circumference. There is still small open areas mostly along the lateral margin of the wound. ooHe has unchanged areas between the left first and second and the right first second toes which I been treating for tenia pedis 06/02/17; left lateral leg venous insufficiency wound. We have been using Hydrofera Blue. Somewhat smaller from the wound circumference. The surface of the wound remains [name] bit on it almost epithelialized sedation in appearance. I use an open curette today debridement in the surface of all of this especially the edges ooSmall open wounds remaining on the dorsal right first and second toe interspace and the plantar left first second toe and her face on the left 06/09/17; wound on the left lateral leg continues to be smaller but very gradual and very dry surface using Hydrofera Blue 06/16/17 requires weekly debridements now on the left lateral leg although this continues to contract. I changed to silver collagen last week because of dryness of the wound bed. Using Iodoflex to the areas on his first and second toes/web space bilaterally 06/24/17; patient with history of paraplegia also chronic venous insufficiency with lymphedema. Has [name] very difficult wound on the left lateral leg. This has been gradually reducing in terms of with but comes in with [name] very dry adherent surface. High switch to silver collagen [name] week or so ago with hydrogel to keep the area moist. This is been refractory to multiple dressing attempts. He also has areas in his first and second toes bilaterally in the anterior and posterior web space. I had been using Iodoflex here after [name] prolonged course of silver alginate with ketoconazole  was ineffective [question tinea pedis] 07/14/17; patient arrives today with [name] very difficult adherent material over his left lateral lower leg wound. He also has surrounding erythema and poorly controlled edema. He was switched his Santyl last visit which the nurses are applying once during his doctor visit and once on [name] nurse visit. He was also reduced to 2 layer compression I'm not exactly sure of the issue here. 07/21/17; better surface today after 1 week of Iodoflex. Significant cellulitis that we treated last week also better. [Doxycycline] 07/28/17 better surface today with now 2 [name] of Iodoflex. Significant cellulitis treated with doxycycline. He has now completed the doxycycline and he is back to his usual degree of chronic venous  inflammation/stasis dermatitis. He reminds me he has had ablations surgery here 08/04/17; continued improvement with Iodoflex to the left lateral leg wound in terms of the surface of the wound although the dimensions are better. 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 Problematic areas on the plantar aspect of the first second toe web space on the left and the dorsal aspect of the first second toe web space on the right. At one point I felt these were probably related to chronic fungal infections in treated him aggressively for this although we have not made any improvement here. 08/11/17; left lateral leg. Surface continues to improve with the Iodoflex although we are not seeing much improvement in overall wound dimensions. Areas on his plantar left foot and right foot show no improvement. In fact the right foot looks somewhat worse 08/18/17; left lateral leg. We changed to [facility] Blue last week after [name] prolonged course of Iodoflex which helps get the surface better. It appears that the wound with is improved. Continue with difficult areas on the left dorsal first second and plantar first second  on the right 09/01/17; patient arrives in clinic today having had [name] temperature of 103 yesterday. He was seen in the ER and [facility]. The patient was concerned he could have cellulitis again in the right leg however they diagnosed him with [name] UTI and he is now on Keflex. He has [name] history of cellulitis which is been recurrent and difficult but this is been in the left leg, in the past 5 use doxycycline. He does in and out catheterizations at home which are risk factors for UTI 09/08/17; patient will be completing his Keflex this weekend. The erythema on the left leg is considerably better. He has [name] new wound today on the medial part of the right leg small superficial almost looks like [name] skin tear. He has worsening of the area on the right dorsal first and second toe. His major area on the left lateral leg is better. Using Hydrofera Blue on all areas 09/15/17; gradual reduction in width on the long wound in the left lateral leg. No debridement required. He also has wounds on the plantar aspect of his left first second toe web space and on the dorsal aspect of the right first second toe web space. 09/22/17; there continues to be very gradual improvements in the dimensions of the left lateral leg wound. He hasn't round erythematous spot with might be pressure on his wheelchair. There is no evidence obviously of infection no purulence no warmth ooHe has [name] dry scaled area on the plantar aspect of the left first second toe ooImproved area on the dorsal right first second toe. 09/29/17; left lateral leg wound continues to improve in dimensions mostly with an is still [name] fairly long but increasingly narrow wound. ooHe has [name] dry scaled area on the plantar aspect of his left first second toe web space ooIncreasingly concerning area on the dorsal right first second toe. In fact I am concerned today about possible cellulitis around this wound. The areas extending up his second toe and although there is  deformities here almost appears to abut on the nailbed. 10/06/17; left lateral leg wound continues to make very gradual progress. Tissue culture I did from the right first second toe dorsal foot last time grew MRSA and enterococcus which was vancomycin sensitive. This was not sensitive to clindamycin or doxycycline. He is allergic to Zyvox and sulfa we  have therefore arrange for him to have dalvance infusion tomorrow. He is had this in the past and tolerated it well 10/20/17; left lateral leg wound continues to make decent progress. This is certainly reduced in terms of with there is advancing epithelialization.ooThe cellulitis in the right foot looks better although he still has [name] deep wound in the dorsal aspect of the first second toe web space. Plantar left first toe web space on the left I think is making some progress 10/27/17; left lateral leg wound continues to make decent progress. Advancing epithelialization.using Hydrofera Blue ooThe right first second toe web space wound is better-looking using silver alginate ooImprovement in the left plantar first second toe web space. Again using silver alginate 11/03/17 left lateral leg wound continues to make decent progress albeit slowly. Using Hydrofera Blue ooThe right per second toe web space continues to be [name] very problematic looking punched out wound. I obtained [name] piece of tissue for deep culture I did extensively treated this for fungus. It is difficult to imagine that this is [name] pressure area as the patient states other than going outside he doesn't really wear shoes at home ooThe left plantar first second toe web space looked fairly senescent. Necrotic edges. This required debridement oochange to Hydrofera Blue to all wound areas 11/10/17; left lateral leg wound continues to contract. Using Hydrofera Blue ooOn the right dorsal first second toe web space dorsally. Culture I did of this area last week grew MRSA there is not an easy oral  option in this patient was multiple antibiotic allergies or intolerances. This was only [name] rare culture isolate I'm therefore going to use Bactroban under silver alginate ooOn the left plantar first second toe web space. Debridement is required here. This is also unchanged 11/17/17; left lateral leg wound continues to contract using Hydrofera Blue this is no longer the major issue. ooThe major concern here is the right first second toe web space. He now has an open area going from dorsally to the plantar aspect. There is now wound on the inner lateral part of the first toe. Not [name] very viable surface on this. There is erythema spreading medially into the forefoot. ooNo major change in the left first second toe plantar wound 11/24/17; left lateral leg wound continues to contract using Hydrofera Blue. Nice improvement today ooThe right first second toe web space all of this looks [name] lot less angry than last week. I have given him clindamycin and topical Bactroban for MRSA and terbinafine for the possibility of underlining tinea pedis that I could not control with ketoconazole. Looks somewhat better ooThe area on the plantar left first second toe web space is weeping with dried debris around the wound 12/01/17; left lateral leg wound continues to contract he Hydrofera Blue. It is becoming thinner in terms of with nevertheless it is making good improvement. ooThe right first second toe web space looks less angry but still [name] large necrotic-looking wounds starting on the plantar aspect of the right foot extending between the toes and now extensively on the base of the right second toe. I gave him clindamycin and topical Bactroban for MRSA anterior benefiting for the possibility of underlying tinea pedis. Not looking better today ooThe area on the left first/second toe looks better. Debrided of necrotic debris 12/05/17* the patient was worked in urgently today because over the weekend he found blood on his  incontinence bad when he woke up. He was found to have an ulcer by his wife who  does most of his wound care. He came in today for [place] to look at this. He has not had [name] history of wounds in his buttocks in spite of his paraplegia. 12/08/17; seen in follow-up today at his usual appointment. He was seen earlier this week and found to have [name] new wound on his buttock. We also follow him for wounds on the left lateral leg, left first second toe web space and right first second toe web space 12/15/17; we have been using Hydrofera Blue to the left lateral leg which has improved. The right first second toe web space has also improved. Left first second toe web space plantar aspect looks stable. The left buttock has worsened using Santyl. Apparently the buttock has drainage 12/22/17; we have been using Hydrofera Blue to the left lateral leg which continues to improve now 2 small wounds separated by normal skin. He tells [place] he had [name] fever up to 100 yesterday he is prone to UTIs but has not noted anything different. He does in and out catheterizations. The area between the first and second toes today does not look good necrotic surface covered with what looks to be purulent drainage and erythema extending into the third toe. I had gotten this to something that I thought look better last time however it is not look good today. He also has [name] necrotic surface over the buttock wound which is expanded. I thought there might be infection under here so I removed [name] lot of the surface with [name] #5 curet though nothing look like it really needed culturing. He is been using Santyl to this area 12/27/17; his original wound on the left lateral leg continues to improve using Hydrofera Blue. I gave him samples of Baxdella although he was unable to take them out of fear for an allergic reaction ["lump in his throat"].the culture I did of the purulent drainage from his second toe last week showed both enterococcus and [name] set Enterobacter I  was also concerned about the erythema on the bottom of his foot although paradoxically although this looks somewhat better today. 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 We've been using silver alginate here. They came up today that he sleeps in [name] recliner, I'm not sure why but I asked him to stop this 01/03/18; his original wound we've been using Hydrofera Blue is now separated into 2 areas. ooUlcer on his left buttock is better he is off the recliner and sleeping in bed ooFinally both wound areas between his first and second toes also looks some better 01/10/18; his original wound on the left lateral leg is now separated into 2 wounds we've been using Hydrofera Blue ooUlcer on his left buttock has some drainage. There is [name] small probing site going into muscle layer superiorly.using silver alginate -He arrives today with [name] deep tissue injury on the left heel ooThe wound on the dorsal aspect of his first second toe on the left looks [name] lot betterusing silver alginate ketoconazole ooThe area on the first second toe web space on the right also looks [name] lot bette 01/17/18; his original wound on the left lateral leg continues to progress using Hydrofera Blue ooUlcer on his left buttock also is smaller surface healthier except for [name] small probing site going into the muscle layer superiorly. 2.4 cm of tunneling in this area ooDTI on his left heel we have only been offloading. Looks  better than last week no threatened open no evidence of infection oothe wound on the dorsal aspect of the first second toe on the left continues to look like it's regressing we have only been using silver alginate and terbinafine orally ooThe area in the first second toe web space on the right also looks to be [name] lot better using silver alginate and terbinafine I think this was prompted by tinea pedis 01/31/18; the patient was  hospitalized in [place] last week apparently for [name] complicated UTI. He was discharged on cefepime he does in and out catheterizations. In the hospital he was discovered M I don't mild elevation of AST and ALT and the terbinafine was stopped.predictably the pressure ulcer on s his buttock looks betterusing silver alginate. The area on the left lateral leg also is better using Hydrofera Blue. The area between the first and second toes on the left better. First and second toes on the right still substantial but better. Finally the DTI on the left heel has held together and looks like it's resolving 02/07/18-he is here in follow-up evaluation for multiple ulcerations. He has new injury to the lateral aspect of the last issue [name] pressure ulcer, he states this is from adhesive removal trauma. He states he has tried multiple adhesive products with no success. All other ulcers appear stable. The left heel DTI is resolving. We will continue with same treatment plan and follow-up next week. 02/14/18; follow-up for multiple areas. ooHe has [name] new area last week on the lateral aspect of his pressure ulcer more over the posterior trochanter. The original pressure ulcer looks quite stable has healthy granulation. 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 Almost fully epithelialized on the original superior area using Hydrofera Blue ooDTI on the left heel has peeled off this week to reveal [name] small superficial wound under denuded skin and subcutaneous tissue ooBoth areas between the first and second toes look better including nothing open on the left 02/21/18; ooThe patient's wounds on his left ischial tuberosity and posterior left greater trochanter actually looked better. He has [name] large area of irritation around the area which I think is contact dermatitis. I am doubtful that this is fungal ooHis original  wound on the left lateral calf continues to improve we have been using Hydrofera Blue ooThere is no open area in the left first second toe web space although there is [name] lot of thick callus ooThe DTI on the left heel required debridement today of necrotic surface eschar and subcutaneous tissue using silver alginate ooFinally the area on the right first second toe webspace continues to contract using silver alginate and ketoconazole 02/28/18 ooLeft ischial tuberosity wounds look better using silver alginate. ooOriginal wound on the left calf only has one small open area left using Hydrofera Blue ooDTI on the left heel required debridement mostly removing skin from around this wound surface. Using silver alginate ooThe areas on the right first/second toe web space using silver alginate and ketoconazole 03/08/18 on evaluation today patient appears to be doing decently well as best I can tell in regard to his wounds. This is the first time that I have seen him as he generally is followed by Dr. Dellia Nims. With that being said none of his wounds appear to be infected he does have an area where there is some skin covering what appears to be [name] new wound on the left dorsal  surface of his great toe. This is right at the nail bed. With that being said I do believe that debrided away some of the excess skin can be of benefit in this regard. Otherwise he has been tolerating the dressing changes without complication. 03/14/18; patient arrives today with the multiplicity of wounds that we are following. He has not been systemically unwell ooOriginal wound on the left lateral calf now only has 2 small open areas we've been using Hydrofera Blue which should continue ooThe deep tissue injury on the left heel requires debridement today. We've been using silver alginate ooThe left first second toe and the right first second toe are both are reminiscence what I think was tinea pedis. Apparently some of the callus  Surface between the toes was removed last week when it started draining. ooPurulent drainage coming from the wound on the ischial tuberosity on the left. 03/21/18-He is here in follow-up evaluation for multiple wounds. 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 He tolerated debridement. The only change to last week's recommendations is to discontinue antifungal cream between toes. He will follow-up next week 03/28/18; following up for multiple wounds;Concern this week is streaking redness and swelling in the right foot. He is going to need antibiotics for this. 03/31/18; follow-up for right foot cellulitis. Streaking redness and swelling in the right foot on 03/28/18. He has multiple antibiotic intolerances and [name] history of MRSA. I put him on clindamycin 300 mg every 6 and brought him in for [name] quick check. He has an open wound between his first and second toes on the right foot as [name] potential source. 04/04/18; ooRight foot cellulitis is resolving he is completing clindamycin. This is truly good news ooLeft lateral calf wound which is initial wound only has one small open area inferiorly this is close to healing out. He has compression stockings. We will use Hydrofera Blue right down to the epithelialization of this ooNonviable surface on the left heel which was initially pressure with [name] DTI. We've been using Hydrofera Blue. I'm going to switch this back to silver alginate ooLeft first second toe/tinea pedis this looks better using silver alginate ooRight first second toe tinea pedis using silver alginate ooLarge pressure ulcers on theLeft ischial tuberosity. Small wound here Looks better. I am uncertain about the surface over the large wound. Using silver alginate 04/11/18; ooCellulitis in the right foot is resolved ooLeft lateral calf wound which was his original wounds still has 2 tiny open areas remaining this is  just about closed ooNonviable surface on the left heel is better but still requires debridement ooLeft first second toe/tinea pedis still open using silver alginate ooRight first second toe wound tinea pedis I asked him to go back to using ketoconazole and silver alginate ooLarge pressure ulcers on the left ischial tuberosity this shear injury here is resolved. Wound is smaller. No evidence of infection using silver alginate 04/18/18; ooPatient arrives with an intense area of cellulitis in the right mid lower calf extending into the right heel area. Bright red and warm. Smaller area on the left anterior leg. He has [name] significant history of MRSA. He will definitely need antibioticsoodoxycycline ooHe now has 2 open areas on the left ischial tuberosity the original large wound and now [name] satellite area which I think was above his initial satellite areas. Not [name] wonderful surface on this satellite area surrounding erythema which looks like pressure related. ooHis left lateral calf wound again  his original wound is just about closed ooLeft heel pressure injury still requiring debridement ooLeft first second toe looks [name] lot better using silver alginate ooRight first second toe also using silver alginate and ketoconazole cream also looks better 04/20/18; the patient was worked in early today out of concerns with his cellulitis on the right leg. I had started him on doxycycline. This was 2 days ago. His wife was concerned about the swelling in the area. Also concerned about the left buttock. He has not been systemically unwell no fever chills. No nausea vomiting or diarrhea 04/25/18; the patient's left buttock wound is continued to deteriorate he is using Hydrofera Blue. He is still completing clindamycin for the cellulitis on the right leg although all of this looks better. 05/02/18 ooLeft buttock wound still with [name] lot of drainage and [name] very tightly adherent fibrinous necrotic surface. He has [name]  deeper area superiorly ooThe left lateral calf wound is still closed ooDTI wound on the left heel necrotic surface especially the circumference using Iodoflex ooAreas between his left first second toe and right first second toe both look better. Dorsally and the right first second toe he had [name] necrotic surface although at smaller. In using silver alginate and ketoconazole. I did [name] culture last week which was [name] deep tissue culture of the reminiscence of the open wound on the right first second toe dorsally. This grew [name] few Acinetobacter and [name] few methicillin-resistant staph aureus. Nevertheless the area actually this week looked better. I didn't feel the need to specifically address this at least in terms of systemic antibiotics. 05/09/18; wounds are measuring larger more drainage per our intake. We are using Santyl covered with alginate on the large superficial buttock wounds, Iodosorb on the left heel, ketoconazole and silver alginate to the dorsal first and second toes bilaterally. 05/16/18; ooThe area on his left buttock better in some aspects although the area superiorly over the ischial tuberosity required an extensive debridement.using Santyl ooLeft heel appears stable. Using Iodoflex ooThe areas between his first and second toes are not bad however there is spreading erythema up the dorsal aspect of his left foot this looks like cellulitis again. He is insensate the erythema is really very brilliant.o Erysipelas He went to see an allergist days ago because he was itching part of this he had lab work done. This showed [name] white count of 15.1 with 70% neutrophils. Hemoglobin of 11.4 and [name] platelet count of 659,000. Last white count we had in [facility] was [name] 2-1/2 years ago which was 25.9 but he was ill at the time. He was able to show me some lab work that was done by his primary physician the pattern is about the same. 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 But prompted me to go ahead and do x-rays of both feet and the pelvis rule out osteomyelitis. He also had [name] comprehensive metabolic panel this was reasonably normal his albumin was 3.7 liver function tests BUN/creatinine all normal 05/23/18; x-rays of both his feet from last week were negative for underlying pulmonary abnormality. The x-ray of his pelvis however showed mild irregularity in the left ischial which may represent some early osteomyelitis. The wound in the left ischial continues to get deeper clearly now exposed muscle. Each week necrotic surface material over this area. Whereas the rest of the wounds do not look so bad. ooThe left ischial wound we have been using Santyl and calcium  alginate ooT the left heel surface necrotic debris using Iodoflex o ooThe left lateral leg is still healed ooAreas on the left dorsal foot and the right dorsal foot are about the same. There is some inflammation on the left which might represent contact dermatitis, fungal dermatitis I am doubtful cellulitis although this looks better than last week 05/30/18; CT scan done at Hospital did not show any osteomyelitis or abscess. Suggested the possibility of underlying cellulitis although I don't see [name] lot of evidence of this at the bedside ooThe wound itself on the left buttock/upper thigh actually looks somewhat better. No debridement ooLeft heel also looks better no debridement continue Iodoflex ooBoth dorsal first second toe spaces appear better using Lotrisone. Left still required debridement 06/06/18; ooIntake reported some purulent looking drainage from the left gluteal wound. Using Santyl and calcium alginate ooLeft heel looks better although still [name] nonviable surface requiring debridement ooThe left dorsal foot first/second webspace actually expanding and somewhat deeper. I may consider doing [name] shave biopsy of this area ooRight dorsal foot first/second webspace appears stable to  improved. Using Lotrisone and silver alginate to both these areas 06/13/18 ooLeft gluteal surface looks better. Now separated in the 2 wounds. No debridement required. Still drainage. We'll continue silver alginate ooLeft heel continues to look better with Iodoflex continue this for at least another week ooOf his dorsal foot wounds the area on the left still has some depth although it looks better than last week. We've been using Lotrisone and silver alginate 06/20/18 ooLeft gluteal continues to look better healthy tissue ooLeft heel continues to look better healthy granulation wound is smaller. He is using Iodoflex and his long as this continues continue the Iodoflex ooDorsal right foot looks better unfortunately dorsal left foot does not. There is swelling and erythema of his forefoot. He had minor trauma to this several days ago but doesn't think this was enough to have caused any tissue injury. Foot looks like cellulitis, we have had this problem before 06/27/18 on evaluation today patient appears to be doing [name] little worse in regard to his foot ulcer. 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 Both of which would really be his only options for treating this infection. In the past he has been given and effusion of Orbactiv. This is done very well for him in the past again it's one time dosing IV antibiotic therapy. Subsequently I do believe this is something we're gonna need to see about doing at this point in time. Currently his other wounds seem to be doing somewhat better in my pinion I'm pretty happy in that regard. 07/03/18 on evaluation today patient's wounds actually appear to be doing fairly well. He has been tolerating the dressing changes without complication. All in all he seems to be showing signs of improvement. In regard to the antibiotics he  has been dealing with infectious disease since I saw him last week as far as getting this scheduled. In the end [facility]'s [facility] this coming Friday. In the meantime he has been continuing to perform the dressing changes in such as previous. There does not appear to be any evidence of infection worsengin at this time. 07/10/18; ooSince I last saw this man 2 [name] ago things have actually improved. IV antibiotics of resulted in less  forefoot erythema although there is still some present. He is not systemically unwell ooLeft buttock wounds o2 now have no depth there is increased epithelialization Using silver alginate ooLeft heel still requires debridement using Iodoflex ooLeft dorsal foot still with [name] sizable wound about the size of [name] border but healthy granulation ooRight dorsal foot still with [name] slitlike area using silver alginate 07/18/18; the patient's cellulitis in the left foot is improved in fact I think it is on its way to resolving. ooLeft buttock wounds o2 both look better although the larger one has hypertension granulation we've been using silver alginate ooLeft heel has some thick circumferential redundant skin over the wound edge which will need to be removed today we've been using Iodoflex ooLeft dorsal foot is still [name] sizable wound required debridement using silver alginate ooThe right dorsal foot is just about closed only [name] small open area remains here 07/25/18; left foot cellulitis is resolved ooLeft buttock wounds o2 both look better. Hyper-granulation on the major area ooLeft heel as some debris over the surface but otherwise looks [name] healthier wound. Using silver collagen ooRight dorsal foot is just about closed 07/31/18; arrives with our intake nurse worried about purulent drainage from the buttock. We had hyper-granulation here last week ooHis buttock wounds o2 continue to look better ooLeft heel some  debris over the surface but measuring smaller. ooRight dorsal foot unfortunately has openings between the toes ooLeft foot superficial wound looks less aggravated. 08/07/18 ooButtock wounds continue to look better although some of her granulation and the larger medial wound. silver alginate ooLeft heel continues to look [name] lot better.silver collagen ooLeft foot superficial wound looks less stable. Requires debridement. He has [name] new wound superficial area on the foot on the lateral dorsal foot. ooRight foot looks better using silver alginate without Lotrisone 08/14/2018; patient was in the ER last week diagnosed with [name] UTI. He is now on Cefpodoxime and Macrodantin. ooButtock wounds continued to be smaller. Using silver alginate ooLeft heel continues to look better using silver collagen ooLeft foot superficial wound looks as though it is improving ooRight dorsal foot area is just about healed. 08/21/2018; patient is completed his antibiotics for his UTI. ooHe has 2 open areas on the buttocks. There is still not closed although the surface looks satisfactory. Using silver alginate ooLeft heel continues to improve using silver collagen ooThe bilateral dorsal foot areas which are at the base of his first and second toes/possible tinea pedis are actually stable on the left but worse on the right. The area on the left required debridement of necrotic surface. After debridement I obtained [name] specimen for PCR culture. ooThe right dorsal foot which is been just about healed last week is now reopened 08/28/2018; culture done on the left dorsal foot showed coag negative staph both staph epidermidis and Lugdunensis. I think this is worthwhile initiating systemic treatment. I will use doxycycline given his long list of allergies. The area on the left heel slightly improved but still requiring debridement. ooThe large wound on the buttock is just about closed whereas the smaller one is larger. Using  silver alginate in this area 09/04/2018; patient is completing his doxycycline for the left foot although this continues to be [name] very difficult wound area with very adherent necrotic debris. We are using silver alginate to all his wounds right foot left foot and the small wounds on his buttock, silver collagen on the left heel. 09/11/2018; once again this patient has intense erythema and swelling of the  left forefoot. Lesser degrees of erythema in the right foot. He has [name] long list of allergies and intolerances. I will reinstitute doxycycline. 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 Using silver alginate ooLeft heel also looks better using silver collagen ooUnfortunately both the areas on his feet look worse. The area on the left first second webspace is now gone through to the plantar part of his foot. The area on the left foot anteriorly is irritated with erythema and swelling in the forefoot. 09/25/2018 ooHis wound on the left plantar heel looks better. Using silver collagen ooThe area on the left buttock 2 small remnant areas. One is closed one is still open. Using silver alginate ooThe areas between both his first and second toes look worse. This in spite of long-standing antifungal therapy with ketoconazole and silver alginate which should have antifungal activity ooHe has small areas around his original wound on the left calf one is on the bottom of the original scar tissue and one superiorly both of these are small and superficial but again given wound history in this site this is worrisome 10/02/2018 ooLeft plantar heel continues to gradually contract using silver collagen ooLeft buttock wound is unchanged using silver alginate ooThe areas on his dorsal feet between his first and second toes bilaterally look about the same. 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 Small open area here. We have been using silver alginate 10/09/2018; ooLeft plantar heel continues to look healthy and contract. No debridement is required ooLeft buttock slightly smaller with [name] tape injury wound just below which was new this week ooDorsal feet somewhat improved I have been using clindamycin ooLeft lateral looks lower extremity the actual open area looks worse although [name] lot of this is epithelialized. I am going to change to silver collagen today He has [name] lot more swelling in the right leg although this is not pitting not red and not particularly warm there is [name] lot of spasm in the right leg usually indicative of people with paralysis of some underlying discomfort. We have reviewed his vascular status from 2017 he had [name] left greater saphenous vein ablation. I wonder about referring him back to vascular surgery if the area on the left leg continues to deteriorate. 10/16/2018 in today for follow-up and management of multiple lower extremity ulcers. His left Buttock wound is much lower smaller and almost closed completely. The wound to the left ankle has began to reopen with Epithelialization and some adherent slough. He has multiple new areas to the left foot and leg. The left dorsal foot without much improvement. Wound present between left great webspace and 2nd toe. Erythema and edema present right leg. Right LE ultrasound obtained on 10/10/18 was negative for DVT . 10/23/2018; ooLeft buttock is closed over. Still dry macerated skin but there is no open wound. I suspect this is chronic pressure/moisture ooLeft lateral calf is quite [name] bit worse than when I saw this last. There is clearly drainage here he has macerated skin into the left plantar heel. We will change the primary dressing to alginate ooLeft dorsal foot has some improvement in overall wound  area. Still using clindamycin and silver alginate ooRight dorsal foot about the same as the left using clindamycin and silver alginate ooThe erythema in  the right leg has resolved. He is DVT rule out was negative ooLeft heel pressure area required debridement although the wound is smaller and the surface is health 10/26/2018 ooThe patient came back in for his nurse check today predominantly because of the drainage coming out of the left lateral leg with [name] recent reopening of his original wound on the left lateral calf. He comes in today with [name] large amount of surrounding erythema around the wound extending from the calf into the ankle and even in the area on the dorsal foot. He is not systemically unwell. He is not febrile. Nevertheless this looks like cellulitis. We have been using silver alginate to the area. I changed him to [name] regular visit and I am going to prescribe him doxycycline. The rationale here is [name] long list of medication intolerances and [name] history of MRSA. I did not see anything that I thought would provide [name] valuable culture 10/30/2018 ooFollow-up from his appointment 4 days ago with really an extensive area of cellulitis in the left calf left lateral ankle and left dorsal foot. I put him on doxycycline. He has [name] long list of medication allergies which are true allergy reactions. 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 In any case he is [name] lot better today. The erythema especially in the anterior and lateral left calf is better. He still has left ankle erythema. He also is complaining about increasing edema in the right leg we have only been using Kerlix Coban and he has been doing the wraps at home. Finally he has [name] spotty rash on the medial part of his upper left calf which looks like folliculitis or perhaps wrap occlusion type injury. Small superficial macules not pustules 11/06/18 patient arrives today with  again [name] considerable degree of erythema around the wound on the left lateral calf extending into the dorsal ankle and dorsal foot. This is [name] lot worse than when I saw this last week. He is on doxycycline really with not [name] lot of improvement. He has not been systemically unwell Wounds on the; left heel actually looks improved. Original area on the left foot and proximity to the first and second toes looks about the same. He has superficial areas on the dorsal foot, anterior calf and then the reopening of his original wound on the left lateral calf which looks about the same ooThe only area he has on the right is the dorsal webspace first and second which is smaller. ooHe has [name] large area of dry erythematous skin on the left buttock small open area here. 11/13/2018; the patient arrives in much better condition. The erythema around the wound on the left lateral calf is [name] lot better. Not sure whether this was the clindamycin or the TCA and ketoconazole or just in the improvement in edema control [stasis dermatitis]. In any case this is [name] lot better. The area on the left heel is very small and just about resolved using silver collagen we have been using silver alginate to the areas on his dorsal feet 11/20/2018; his wounds include the left lateral calf, left heel, dorsal aspects of both feet just proximal to the first second webspace. He is stable to slightly improved. I did not think any changes to his dressings were going to be necessary 11/27/2018 he has [name] reopening on the left buttock which is surrounded by what looks like tinea or perhaps some other form of dermatitis. The  area on the left dorsal foot has some erythema around it I have marked this area but I am not sure whether this is cellulitis or not. Left heel is not closed. Left calf the reopening is really slightly longer and probably worse 1/13; in general things look better and smaller except for the left dorsal foot. Area on the left heel is  just about closed, left buttock looks better only [name] small wound remains in the skin looks better [using Lotrisone] 1/20; the area on the left heel only has [name] few remaining open areas here. Left lateral calf about the same in terms of size, left dorsal foot slightly larger right lateral foot still not closed. The area on the left buttock has no open wound and the surrounding skin looks [name] lot better 1/27; the area on the left heel is closed. Left lateral calf better but still requiring extensive debridements. The area on his left buttock is closed. He still has the open areas on the left dorsal foot which is slightly smaller in the right foot which is slightly expanded. We have been using Iodoflex on these areas as well 2/3; left heel is closed. Left lateral calf still requiring debridement using Iodoflex there is no open area on his left buttock however he has dry scaly skin over [name] large area of this. Not really responding well to the Lotrisone. Finally the areas on his dorsal feet at the level of the first second webspace are slightly smaller on the right and about the same on the left. Both of these vigorously debrided with Anasept and gauze 2/10; left heel remains closed he has dry erythematous skin over the left buttock but there is no open wound here. Left lateral leg has come in and with. Still requiring debridement we have been using Iodoflex here. Finally the area on the left dorsal foot and right dorsal foot are really about the same extremely dry callused fissured areas. He does not yet have [name] dermatology appointment 2/17; left heel remains closed. He has [name] new open area on the left buttock. The area on the left lateral calf is bigger longer and still covered in necrotic debris. No major change in his foot areas bilaterally. I am awaiting for [name] dermatologist to look on this. We have been using ketoconazole I do not know that this is been doing any good at all. 2/24; left heel remains closed.  The left buttock wound that was new reopening last week looks better. The left lateral calf appears better also although still requires debridement. The major area on his foot is the left first second also requiring debridement. We have been putting Prisma on all wounds. I do not believe that the ketoconazole has done too much good for his feet. He will use Lotrisone I am going to give him [name] 2-week course of terbinafine. We still do not have [name] dermatology appointment 3/2 left heel remains closed however there is skin over bone in this area I pointed this out to him today. The left buttock wound is epithelialized but still does not look completely stable. The area on the left leg required debridement were using silver collagen here. With regards to his feet we changed to Lotrisone last week and silver alginate. 3/9; left heel remains closed. Left buttock remains closed. The area on the right foot is essentially closed. The left foot remains unchanged. Slightly smaller on the left lateral calf. Using silver collagen to both of these areas 3/16-Left heel remains  closed. Area on right foot is closed. Left lateral calf above the lateral malleolus open wound requiring debridement with easy bleeding. Left dorsal wound proximal to first toe also debrided. Left ischial area open new. Patient has been using Prisma with wrapping every 3 days. Dermatology appointment is apparently tomorrow.Patient has completed his terbinafine 2-week course with some apparent improvement according to him, there is still flaking and dry skin in his foot on the left 3/23; area on the right foot is reopened. The area on the left anterior foot is about the same still [name] very necrotic adherent surface. He still has the area on the left leg and reopening is on the left buttock. He apparently saw dermatology although I do not have [name] note. According to the patient who is usually fairly well informed they did not have any good ideas. Put  him on oral terbinafine which she is been on before. 3/30; using silver collagen to all wounds. Apparently his dermatologist put him on doxycycline and rifampin presumably some culture grew staph. I do not have this result. He remains on terbinafine although I have used terbinafine on him before 4/6; patient has had [name] fairly substantial reopening on the right foot between the first and second toes. He is finished his terbinafine and I believe is on doxycycline and rifampin still as prescribed by dermatology. We have been using silver collagen to all his wounds although the patient reports that he thinks silver alginate does better on the wounds on his buttock. 4/13; the area on his left lateral calf about the same size but it did not require debridement. ooLeft dorsal foot just proximal to the webspace between the first and second toes is about the same. Still nonviable surface. I note some superficial bronze discoloration of the dorsal part of his foot ooRight dorsal foot just proximal to the first and second toes also looks about the same. I still think there may be the same discoloration I noted above on the left ooLeft buttock wound looks about the same 4/20; left lateral calf appears to be gradually contracting using silver collagen. 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 The left dorsal foot wound is debrided of tightly adherent necrotic debris and really cleans up quite nicely. The right area is worse with expansion. I did not debride this it is now over the base of the second toe ooThe area on his left buttock is smaller no debridement is required using silver collagen 5/4; left calf continues to make good progress. ooHe arrives with erythema around the wounds on his dorsal foot which even extends to the plantar aspect. Very concerning for coexistent infection. He is finished the erythromycin I gave him for possible  erythrasma this does not seem to have helped. ooThe area on the left foot is about the same base of the dorsal toes ooIs area on the buttock looks improved on the left 5/11; left calf and left buttock continued to make good progress. Left foot is about the same to slightly improved. ooMajor problem is on the right foot. He has not had an x-ray. Deep tissue culture I did last week showed both Enterobacter and E. coli. I did not change the doxycycline I put him on empirically although neither 1 of these were plated to doxycycline. He arrives today with the erythema looking worse on both the dorsal and plantar foot. Macerated skin on the bottom of the foot. he has not been systemically unwell 5/18-Patient  returns at 1 week, left calf wound appears to be making some progress, left buttock wound appears slightly worse than last time, left foot wound looks slightly better, right foot redness is marginally better. X-ray of both feet show no air or evidence of osteomyelitis. Patient is finished his Omnicef and terbinafine. He continues to have macerated skin on the bottom of the left foot as well as right 5/26; left calf wound is better, left buttock wound appears to have multiple small superficial open areas with surrounding macerated skin. X-rays that I did last time showed no evidence of osteomyelitis in either foot. He is finished cefdinir and doxycycline. I do not think that he was on terbinafine. He continues to have [name] large superficial open area on the right foot anterior dorsal and slightly between the first and second toes. I did send him to dermatology 2 months ago or so wondering about whether they would do [name] fungal scraping. I do not believe they did but did do [name] culture. We have been using silver alginate to the toe areas, he has been using antifungals at home topically either ketoconazole or Lotrisone. We are using silver collagen on the left foot, silver alginate on the right, silver  collagen on the left lateral leg and silver alginate on the left buttock 6/1; left buttock area is healed. We have the left dorsal foot, left lateral leg and right dorsal foot. We are using silver alginate to the areas on both feet and silver collagen to the area on his left lateral calf 6/8; the left buttock apparently reopened late last week. He is not really sure how this happened. He is tolerating the terbinafine. Using silver alginate to all wounds 6/15; left buttock wound is larger than last week but still superficial. ooCame in the clinic today with [name] report of purulence from the left lateral leg I did not identify any infection ooBoth areas on his dorsal feet appear to be better. He is tolerating the terbinafine. Using silver alginate to all wounds 6/22; left buttock is about the same this week, left calf quite [name] bit better. His left foot is about the same however he comes in with erythema and warmth in the right forefoot once again. Culture that I gave him in the beginning of May showed Enterobacter and E. coli. I gave him doxycycline and things seem to improve although neither 1 of these organisms was specifically plated. 6/29; left buttock is larger and dry this week. Left lateral calf looks to me to be improved. Left dorsal foot also somewhat improved right foot completely unchanged. The erythema on the right foot is still present. He is completing the Ceftin dinner that I gave him empirically [see discussion above.) 7/6 - All wounds look to be stable and perhaps improved, the left buttock wound is slightly smaller, per patient bleeds easily, completed ceftin, the right foot redness is less, he is on terbinafine 7/13; left buttock wound about the same perhaps slightly narrower. Area on the left lateral leg continues to narrow. Left dorsal foot slightly smaller right foot about the same. We are using silver alginate on the right foot and Hydrofera Blue to the areas on the left. Unna boot  on the left 2 layer compression on the right 7/20; left buttock wound absolutely the same. Area on lateral leg continues to get better. Left dorsal foot require debridement as did the right no major change in the 7/27; left buttock wound the same size necrotic debris over the  surface. The area on the lateral leg is closed once again. His left foot looks better right foot about the same although there is some involvement now of the posterior first second toe area. He is still on terbinafine which I have given him for [name] month, not certain [name] centimeter major change 06/25/19-All wounds appear to be slightly improved according to report, left buttock wound looks clean, both foot wounds have minimal to no debris the right dorsal foot has minimal slough. We are using Hydrofera Blue to the left and silver alginate to the right foot and ischial wound. 8/10-Wounds all appear to be around the same, the right forefoot distal part has some redness which was not there before, however the wound looks clean and small. Ischial wound looks about the same with no changes 8/17; his wound on the left lateral calf which was his original chronic venous insufficiency wound remains closed. Since I last saw him the areas on the left dorsal foot right dorsal foot generally appear better but require debridement. The area on his left initial tuberosity appears somewhat larger to me perhaps hyper granulated and bleeds very easily. We have been using Hydrofera Blue to the left dorsal foot and silver alginate to everything else 8/24; left lateral calf remains closed. The areas on his dorsal feet on the webspace of the first and second toes bilaterally both look better. The area on the left buttock which is the pressure ulcer stage II slightly smaller. I change the dressing to Hydrofera Blue to all areas 8/31; left lateral calf remains closed. The area on his dorsal feet bilaterally look better. Using Hydrofera Blue. Still requiring  debridement on the left foot. No change in the left buttock pressure ulcers however 9/14; left lateral calf remains closed. Dorsal feet look quite [name] bit better than 2 [name] ago. Flaking dry skin also [name] lot better with the ammonium lactate I gave him 2 [name] ago. The area on the left buttock is improved. He states that his Roho cushion developed [name] leak and he is getting [name] new one, in the interim he is offloading this vigorously 9/21; left calf remains closed. Left heel which was [name] possible DTI looks better this week. He had macerated tissue around the left dorsal foot right foot looks satisfactory and improved left buttock wound. I changed his dressings to his feet to silver alginate bilaterally. Continuing Hydrofera Blue on the left buttock. 9/28 left calf remains closed. Left heel did not develop anything [possible DTI] dry flaking skin on the left dorsal foot. Right foot looks satisfactory. Improved left buttock wound. We are using silver alginate on his feet Hydrofera Blue on the buttock. I have asked him to go back to the Lotrisone on his feet including the wounds and surrounding areas 10/5; left calf remains closed. The areas on the left and right feet about the same. [name] lot of this is epithelialized however debris over the remaining open areas. He is using Lotrisone and silver alginate. The area on the left buttock using Hydrofera Blue 10/26. Patient has been out for 3 [name] secondary to Covid concerns. He tested negative but I think his wife tested positive. He comes in today with the left foot substantially worse, right foot about the same. Even more concerning he states that the area on his left buttock closed over but then reopened and is considerably deeper in one aspect than it was before [stage III wound] 11/2; left foot really about the same as last week.  Quarter sized wound on the dorsal foot just proximal to the first second toes. Surrounding erythema with areas of denuded epithelium.  This is not really much different looking. Did not look like cellulitis this time however. ooRight foot area about the same.. We have been using silver alginate alginate on his toes ooLeft buttock still substantial irritated skin around the wound which I think looks somewhat better. We have been using Hydrofera Blue here. 11/9; left foot larger than last week and [name] very necrotic surface. Right foot I think is about the same perhaps slightly smaller. Debris around the circumference also addressed. Unfortunately on the left buttock there is been [name] decline. Satellite lesions below the major wound distally and now [name] an additional one posteriorly we have been using Hydrofera Blue but I think this is [name] pressure issue 11/16; left foot ulcer dorsally again [name] very adherent necrotic surface. Right foot is about the same. Not much change in the pressure ulcer on his left buttock. 11/30; left foot ulcer dorsally basically the same as when I saw him 2 [name] ago. Very adherent fibrinous debris on the wound surface. Patient reports [name] lot of drainage as well. 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 We have been using Iodoflex, patient changed back to alginate because of the drainage. Area on his right dorsal foot really looks benign with [name] healthier surface certainly [name] lot better than on the left. Left buttock wounds all improved using Hydrofera Blue 12/7; left dorsal foot again no improvement. Tightly adherent debris. PCR culture I did last week only showed likely skin contaminant. I have gone ahead and done [name] punch biopsy of this which is about the last thing in terms of investigations I can think to do. He has known venous insufficiency and venous hypertension and this could be the issue here. The area on the right foot is about the same left buttock slightly worse according to our intake nurse secondary to [facility] Blue sticking to the wound 12/14; biopsy of  the left foot that I did last time showed changes that could be related to wound healing/chronic stasis dermatitis phenomenon no neoplasm. We have been using silver alginate to both feet. I change the one on the left today to Sorbact and silver alginate to his other 2 wounds 12/28; the patient arrives with the following problems; ooMajor issue is the dorsal left foot which continues to be [name] larger deeper wound area. Still with [name] completely nonviable surface ooParadoxically the area mirror image on the right on the right dorsal foot appears to be getting better. ooHe had some loss of dry denuded skin from the lower part of his original wound on the left lateral calf. Some of this area looked [name] little vulnerable and for this reason we put him in wrap that on this side this week ooThe area on his left buttock is larger. He still has the erythematous circular area which I think is [name] combination of pressure, sweat. This does not look like cellulitis or fungal dermatitis 11/26/2019; -Dorsal left foot large open wound with depth. Still debris over the surface. Using Sorbact ooThe area on the dorsal right foot paradoxically has closed over [facility] has [name] reopening on the left ankle laterally at the base of his original wound that extended up into the calf. This appears clean. ooThe left buttock wound is smaller but with very adherent necrotic debris over the surface. We have been using silver alginate here  as well The patient had arterial studies done in 2017. He had biphasic waveforms at the dorsalis pedis and posterior tibial bilaterally. ABI in the left was 1.17. Digit waveforms were dampened. He has slight spasticity in the great toes I do not think [name] TBI would be possible 1/11; the patient comes in today with [name] sizable reopening between the first and second toes on the right. This is not exactly in the same location where we have been treating wounds previously. According to our intake nurse this was  actually fairly deep but 0.6 cm. The area on the left dorsal foot looks about the same the surface is somewhat cleaner using Sorbact, his MRI is in 2 days. We have not managed yet to get arterial studies. The new reopening on the left lateral calf looks somewhat better using alginate. The left buttock wound is about the same using alginate 1/18; the patient had his ARTERIAL studies which were quite normal. ABI in the right at 1.13 with triphasic/biphasic waveforms on the left ABI 1.06 again with triphasic/biphasic waveforms. It would not have been possible to have done [name] toe brachial index because of spasticity. We have been using Sorbac to the left foot alginate to the rest of his wounds on the right foot left lateral calf and left buttock 1/25; arrives in clinic with erythema and swelling of the left forefoot worse over the first MTP area. This extends laterally dorsally and but also posteriorly. Still has an area on the left lateral part of the lower part of his calf wound it is eschared and clearly not closed. ooArea on the left buttock still with surrounding irritation and erythema. ooRight foot surface wound dorsally. The area between the right and first and second toes appears better. 2/1; ooThe left foot wound is about the same. Erythema slightly better I gave him [name] week of doxycycline empirically ooRight foot wound is more extensive extending between the toes to the plantar surface ooLeft lateral calf really no open surface on the inferior part of his original wound however the entire area still looks vulnerable ooAbsolutely no improvement in the left buttock wound required debridement. 2/8; the left foot is about the same. Erythema is slightly improved I gave him clindamycin last week. ooRight foot looks better he is using Lotrimin and silver alginate ooHe has [name] breakdown in the left lateral calf. Denuded epithelium which I have removed ooLeft buttock about the same were using  Hydrofera Blue 2/15; left foot is about the same there is less surrounding erythema. Surface still has tightly adherent debris which I have debriding however not making any progress ooRight foot has [name] substantial wound on the medial right second toe between the first and second webspace. ooStill an open area on the left lateral calf distal area. ooButtock wound is about the same 2/22; left foot is about the same less surrounding erythema. Surface has adherent debris. Polymen Ag Right foot area significant wound between the first and second toes. We have been using silver alginate here Left lateral leg polymen Ag at the base of his original venous insufficiency wound ooLeft buttock some improvement here 3/1; ooRight foot is deteriorating in the first second toe webspace. Larger and more substantial. We have been using silver alginate. ooLeft dorsal foot about the same markedly adherent surface debris using PolyMem Ag ooLeft lateral calf surface debris using PolyMem AG ooLeft buttock is improved again using PolyMem Ag. ooHe is completing his terbinafine. The erythema in the foot seems better. He  has been on this for 2 [name] 3/8; no improvement in any wound area in fact he has [name] small open area on the dorsal midfoot which is new this week. He has not gotten his foot x-rays yet 3/15; his x-rays were both negative for osteomyelitis of both feet. No major change in any of his wounds on the extremities however his buttock wounds are better. We have been using polymen on the buttocks, left lower leg. Iodoflex on the left foot and silver alginate on the right 3/22; arrives in clinic today with the 2 major issues are the improvement in the left dorsal foot wound which for once actually looks healthy with [name] nice healthy wound surface without debridement. Using Iodoflex here. Unfortunately on the left lateral calf which is in the distal part of his original wound he came to the clinic here for  there was purulent drainage noted some increased breakdown scattered around the original area and [name] small area proximally. We we are using polymen here will change to silver alginate today. His buttock wound on the left is better and I think the area on the right first second toe webspace is also improved 3/29; left dorsal foot looks better. Using Iodoflex. Left ankle culture from deterioration last time grew E. coli, Enterobacter and Enterococcus. I will give him [name] course of cefdinir although that will not cover Enterococcus. The area on the right foot in the webspace of the first and second toe lateral first toe looks better. The area on his buttock is about healed Vascular appointment is on April 21. This is to look at his venous system vis--vis continued breakdown of the wounds on the left including the left lateral leg and left dorsal foot he. He has had previous ablations on this side 4/5; the area between the right first and second toes lateral aspect of the first toe looks better. Dorsal aspect of the left first toe on the left foot also improved. Unfortunately the left lateral lower leg is larger and there is [name] second satellite wound superiorly. The usual superficial abrasions on the left buttock overall better but certainly not closed 4/12; the area between the right first and second toes is improved. Dorsal aspect of the left foot also slightly smaller with [name] vibrant healthy looking surface. No real change in the left lateral leg and the left buttock wound is healed He has an unaffordable co-pay for Apligraf. Appointment with vein and vascular with regards to the left leg venous part of the circulation is on 4/21 4/19; we continue to see improvement in all wound areas. Although this is minor. He has his vascular appointment on 4/21. The area on the left buttock has not reopened although right in the center of this area the skin looks somewhat threatened 4/26; the left buttock is  unfortunately reopened. In general his left dorsal foot has [name] healthy surface and looks somewhat smaller although it was not measured as such. The area between his first and second toe webspace on the right as [name] small wound against the first toe. The patient saw vascular surgery. The real question I was asking was about the small saphenous vein on the left. He has previously ablated left greater saphenous vein. Nothing further was commented on on the left. Right greater saphenous vein without reflux at the saphenofemoral junction or proximal thigh there was no indication for ablation of the right greater saphenous vein duplex was negative for DVT bilaterally. They did not think there was  anything from [name] point of view that could be offered. They ABIs within normal limits 5/3; only small open area on the left buttock. The area on the left lateral leg which was his original venous reflux is now 2 wounds both which look clean. We are using Iodoflex on the left dorsal foot which looks healthy and smaller. He is down to [name] very tiny area between the right first and second toes, using silver alginate 5/10; all of his wounds appear better. We have much better edema control in 4 layer compression on the left. This may be the factor that is allowing the left foot and left lateral calf to heal. He has external compression garments at home 04/14/20-All of his wounds are progressing well, the left forefoot is practically closed, left ischium appears to be about the same, right toe webspace is also smaller. The left lateral leg is about the same, continue using Hydrofera Blue to this, silver alginate to the ischium, Iodoflex to the toe space on the right 6/7; most of his wounds outside of the left buttock are doing well. The area on the left lateral calf and left dorsal foot are smaller. The area on the right foot in between the first and second toe webspace is barely visible although he still says  there is some drainage here is the only reason I did not heal this out. ooUnfortunately the area on the left buttock almost looks like he has [name] skin tear from tape. He has open wound and then [name] large flap of skin that we are trying to get adherence over an area just next to the remaining wound 6/21; 2 week follow-up. I believe is been here for nurse visits. Miraculously the area between his first and second toes on the left dorsal foot is closed over. Still open on the right first second web space. The left lateral calf has 2 open areas. Distally this is more superficial. The proximal area had [name] little more depth and required debridement of adherent necrotic material. His buttock wound is actually larger we have been using silver alginate here 6/28; the patient's area on the left foot remains closed. Still open wet area between the first and second toes on the right and also extending into the plantar aspect. We have been using silver alginate in this location. He has 2 areas on the left lower leg part of his original long wounds which I think are better. We have been using Hydrofera Blue here. Hydrofera Blue to the left buttock which is stable 7/12; left foot remains closed. Left ankle is closed. May be [name] small area between his right first and second toes the only truly open area is on the left buttock. We have been using Hydrofera Blue here 7/19; patient arrives with marked deterioration especially in the left foot and ankle. We did not put him in [name] compression wrap on the left last week in fact he wore his juxta lite stockings on either side although he does not have an underlying stocking. He has [name] reopening on the left dorsal foot, left lateral ankle and [name] new area on the right dorsal ankle. More worrisome is the degree of erythema on the left foot extending on the lateral foot into the lateral lower leg on the left 7/26; the patient had erythema and drainage from the lateral left ankle last  week. Culture of this grew MRSA resistant to doxycycline and clindamycin which are the 2 antibiotics we usually use  with this patient who has multiple antibiotic allergies including linezolid, trimethoprim sulfamethoxazole. I had give him an empiric doxycycline and he comes in the area certainly looks somewhat better although it is blotchy in his lower leg. He has not been systemically unwell. He has had areas on the left dorsal foot which is [name] reopening, chronic wounds on the left lateral ankle. Both of these I think are secondary to chronic venous insufficiency. The area between his first and second toes is closed as far as I can tell. He had [name] new wrap injury on the right dorsal ankle last week. Finally he has an area on the left buttock. We have been using silver alginate to everything except the left buttock we are using Hydrofera Blue 06/30/20-Patient returns at 1 week, has been given [name] sample dose pack of NUZYRA which is [name] tetracycline derivative [omadacycline], patient has completed those, we have been using silver alginate to almost all the wounds except the left ischium where we are using Hydrofera Blue all of them look better 8/16; since I last saw the patient he has been doing well. The area on the left buttock, left lateral ankle and left foot are all closed today. He has completed the [place] I gave him last time and tolerated this well. He still has open areas on the right dorsal ankle and in the right first second toe area which we are using silver alginate. 8/23; we put him in his bilateral external compression stockings last week as he did not have anything open on either leg except for concerning area between the right first and second toe. He comes in today with an area on the left dorsal foot slightly more proximal than the original wound, the left lateral foot but this is actually [name] continuation of the area he had on the left lateral ankle from last time. As well he is opened up on  the left buttock again. 8/30; comes in today with things looking [name] lot better. The area on the left lower ankle has closed down as has the left foot but with eschar in both areas. The area on the dorsal right ankle is also epithelialized. Very little remaining of the left buttock wound. We have been using silver alginate on all wound areas 9/13; the area in the first second toe webspace on the right has fully epithelialized. He still has some vulnerable epithelium on the right and the ankle and the dorsal foot. He notes weeping. He is using his juxta lite stocking. On the left again the left dorsal foot is closed left lateral ankle is closed. We went to the juxta lite stocking here as well. ooStill vulnerable in the left buttock although only 2 small open areas remain here 9/27; 2-week follow-up. We did not look at his left leg but the patient says everything is closed. He is [name] bit disturbed by the amount of edema in his left foot he is using juxta lite stockings but asking about over the toes stockings which would be 30/40, will talk to him next time. According to him there is no open wound on either the left foot or the left ankle/calf He has an open area on the dorsal right calf which I initially point [name] wrap injury. He has superficial remaining wound on the left ischial tuberosity been using silver alginate although he says this sticks to the wound 10/5; we gave him 2-week follow-up but he called yesterday expressing some concerns about his right foot right  ankle and the left buttock. He came in early. There is still no open areas on the left leg and that still in his juxta lite stocking 10/11; he only has 1 small area on the left buttock that remains measuring millimeters 1 mm. Still has the same irritated skin in this area. We recommended zinc oxide when this eventually closes and pressure relief is meticulously is he can do this. He still has an area on the dorsal part of his right first  through third toes which is [name] bit irritated and still open and on the dorsal ankle near the crease of the ankle. We have been using silver alginate and using his own stocking. He has nothing open on the left leg or foot 10/25; 2-week follow-up. Not nearly as good on the left buttock as I was hoping. For open areas with 5 looking threatened small. He has the erythematous irritated chronic skin in this area. oo1 area on the right dorsal ankle. He reports this area bleeds easily ooRight dorsal foot just proximal to the base of his toes ooWe have been using silver alginate. 11/8; 2-week follow-up. Left buttock is about the same although I do not think the wounds are in the same location we have been using silver alginate. I have asked him to use zinc oxide on the skin around the wounds. ooHe still has [name] small area on the right dorsal ankle he reports this bleeds easily ooRight dorsal foot just proximal to the base of the toes does not have anything open although the skin is very dry and scaly ooHe has [name] new opening on the nailbed of the left great toe. Nothing on the left ankle 11/29; 3-week follow-up. Left buttock has 2 open areas. And washing of these wounds today started bleeding easily. Suggesting very friable tissue. We have been using silver alginate. Right dorsal ankle which I thought was initially [name] wrap injury we have been using silver alginate. Nothing open between the toes that I can see. He states the area on the left dorsal toe nailbed healed after the last visit in 2 or 3 days 12/13; 3-week follow-up. His left buttock now has 3 open areas but the original 2 areas are smaller using polymen here. Surrounding skin looks better. The right dorsal ankle is closed. He has [name] small opening on the right dorsal foot at the level of the third toe. In general the skin looks better here. He is wearing his juxta lite stocking on the left leg says there is nothing open 11/24/2020; 3 [name] follow-up.  His left buttock still has the 3 open areas. We have been using polymen but due to lack of response he changed to [facility] area. Surrounding skin is dry erythematous and irritated looking. There is no evidence of infection either bacterial or fungal however there is loss of surface epithelium ooHe still has very dry skin in his foot causing irritation and erythema on the dorsal part of his toes. This is not responded to prolonged courses of antifungal simply looks dry and irritated 1/24; left buttock area still looks about the same he was unable to find the triad ointment that we had suggested. The area on the right lower leg just above the dorsal ankle has reopened and the areas on the right foot between the first second and second third toes and scaling on the bottom of the foot has been about the same for quite some time now. been using silver alginate to all wound  areas 2/7; left buttock wound looked quite good although not much smaller in terms of surface area surrounding skin looks better. Only [name] few dry flaking areas on the right foot in between the first and second toes the skin generally looks better here [ammonium lactate]. Finally the area on the right dorsal ankle is closed 2/21; ooThere is no open area on the right foot even between the right first and second toe. Skin around this area dorsally and plantar aspects look better. ooHe has [name] reopening of the area on the right ankle just above the crease of the ankle dorsally. I continue to think that this is probably friction from spasms may be even this time with his stocking under the compression stockings. ooWounds on his left buttock look about the same there [name] couple of areas that have reopened. He has [name] total square area of loss of epithelialization. This does not look like infection it looks like [name] contact dermatitis but I just cannot determine to what 3/14; there is nothing on the right foot between the first and second  toes this was carefully inspected under illumination. Some chronic irritation on the dorsal part of his foot from toes 1-3 at the base. Nothing really open here substantially. Still has an area on the right foot/ankle that is actually larger and hyper granulated. His buttock area on the left is just about closed however he has chronic inflammation with loss of the surface epithelial layer 3/28; 2-week follow-up. In clinic today with [name] new wound on the left anterior mid tibia. Says this happened about 2 [name] ago. He is not really sure how wonders about the spasticity of his legs at night whether that could have caused this other than that he does not have [name] good idea. He has been using topical antibiotics and silver alginate. The area on his right dorsal ankle seems somewhat better. ooFinally everything on his left buttock is closed. 4/11; 2-week follow-up. All of his wounds are better except for the area over the ischium and left buttock which have opened up widely again. At least part of this is covered in necrotic fibrinous material another part had rolled nonviable skin. The area on the right ankle, left anterior mid tibia are both [name] lot better. He had no open wounds on either foot including the areas between the first and second toes 4/25; patient presents for 2-week follow-up. He states that the wounds are overall stable. He has no complaints today and states he is using Hydrofera Blue to open wounds. 5/9; have not seen this man in over [name] month. For my memory he has open areas on the left mid tibia and right ankle. T oday he has new open area on the right dorsal foot which we have not had [name] problem with recently. He has the sustained area on the left buttock He is also changed his insurance at the beginning of the year [employer]. We will need prior authorizations for debridement 5/23; patient presents for 2-week follow-up. He has prior authorizations for debridement. He denies any issues  in the past 2 [name] with his wound care. He has been using Hydrofera Blue to all the wounds. He does report [name] circular rash to the upper left leg that is new. He denies acute signs of infection. 6/6; 2-week follow-up. The patient has open wounds on the left buttock which are worse than the last time I saw this about [name] month ago. He also has [name] new area to  me on the left anterior mid tibia with some surrounding erythema. The area on the dorsal ankle on the right is closed but I think this will be [name] friction injury every time this area is exposed to either our wraps or his compression stockings caused by unrelenting spasms in this leg. 6/20; 2-week follow-up. oo The patient has open wounds on the left buttock which is about the same. Using [facility] here. - The left mid tibia has [name] static amount of surrounding erythema. Also [name] raised area in the center. We have been using Hydrofera Blue here. ooooFinally he has broken down in his dorsal right foot extending between the first and second toes and going to the base of the first and second toe webspace. I have previously assumed that this was severe venous hypertension Objective Constitutional Sitting or standing Blood Pressure is within target range for patient.. Pulse regular and within target range for patient.Marland Kitchen Respirations regular, non-labored and within target range.. Temperature is normal and within the target range for the patient.Marland Kitchen Appears in no distress. Vitals Time Taken: 8:08 AM, Height: 70 in, Source: Stated, Weight: 216 lbs, Source: Stated, BMI: 31, Temperature: 97.8 F, Pulse: 114 bpm, Respiratory Rate: 18 breaths/min, Blood Pressure: 133/65 mmHg. General Notes: Wound exam oo Right dorsal ankle remains closed. He has reopened on the right dorsal foot in between the first and second toes and the base of the first and second toes. oo Left anterior mid tibia. The wound seems to have expanded with more marked surrounding erythema. I  opened this obtaining [name] sanguinous discharge which I have cultured. oo Left buttock looks about the same we have been using Hydrofera Blue in this area. Integumentary (Hair, Skin) Wound #41R status is Open. Original cause of wound was Gradually Appeared. The date acquired was: 03/16/2020. The wound has been in treatment 60 [name]. The wound is located on the Left Ischium. The wound measures 2.2cm length x 2.7cm width x 0.1cm depth; 4.665cm^2 area and 0.467cm^3 volume. There is Fat Layer (Subcutaneous Tissue) exposed. There is no tunneling or undermining noted. There is [name] medium amount of serosanguineous drainage noted. The wound margin is flat and intact. There is large (67-100%) red, pink, friable granulation within the wound bed. There is [name] small (1-33%) amount of necrotic tissue within the wound bed including Adherent Slough. Wound #51 status is Open. Original cause of wound was Trauma. The date acquired was: 01/26/2021. The wound has been in treatment 12 [name]. The wound is located on the Left,Anterior Lower Leg. The wound measures 0.7cm length x 0.7cm width x 0.1cm depth; 0.385cm^2 area and 0.038cm^3 volume. There is Fat Layer (Subcutaneous Tissue) exposed. There is no tunneling or undermining noted. There is [name] medium amount of serosanguineous drainage noted. The wound margin is flat and intact. There is large (67-100%) red, friable granulation within the wound bed. There is [name] small (1-33%) amount of necrotic tissue within the wound bed including Adherent Slough. Wound #52 status is Open. Original cause of wound was Gradually Appeared. The date acquired was: 03/27/2021. The wound has been in treatment 6 [name]. The wound is located on the Right,Dorsal Foot. The wound measures 0.5cm length x 1.4cm width x 0.1cm depth; 0.55cm^2 area and 0.055cm^3 volume. There is Fat Layer (Subcutaneous Tissue) exposed. There is no tunneling or undermining noted. There is [name] medium amount of sanguinous drainage noted. The  wound margin is distinct with the outline attached to the wound base. There is small (1-33%) red granulation within  the wound bed. There is [name] large (67-100%) amount of necrotic tissue within the wound bed including Adherent Slough. Wound #53 status is Open. Original cause of wound was Gradually Appeared. The date acquired was: 05/11/2021. The wound is located on the Right,Plantar T oe Second. The wound measures 1.5cm length x 1cm width x 0.1cm depth; 1.178cm^2 area and 0.118cm^3 volume. There is Fat Layer (Subcutaneous Tissue) exposed. There is no tunneling or undermining noted. There is [name] medium amount of serosanguineous drainage noted. The wound margin is flat and intact. There is small (1-33%) pink granulation within the wound bed. There is [name] large (67-100%) amount of necrotic tissue within the wound bed including Adherent Slough. Wound #54 status is Open. Original cause of wound was Pressure Injury. The date acquired was: 05/11/2021. The wound is located on the Left,Distal Ischium. The wound measures 1.8cm length x 1cm width x 0.1cm depth; 1.414cm^2 area and 0.141cm^3 volume. There is Fat Layer (Subcutaneous Tissue) exposed. There is no tunneling or undermining noted. There is [name] medium amount of serosanguineous drainage noted. The wound margin is flat and intact. There is large (67-100%) red, friable granulation within the wound bed. There is no necrotic tissue within the wound bed. 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 [place], Wed, or Thurs with Dr. Dellia Nims Bathing/ Shower/ Hygiene: May shower and wash wound with  soap and water. - on days that dressing is changed Edema Control - Lymphedema / SCD / Other: Elevate legs to the level of the heart or above for 30 minutes daily and/or when sitting, [name] frequency of: - throughout the day Compression stocking or Garment 30-40 mm/Hg pressure to: - Juxtalite to both legs daily Off-Loading: Roho cushion for wheelchair Turn and reposition every 2 hours Laboratory ordered were: Anerobic culture - left anterior lower leg WOUND #41R: - Ischium Wound Laterality: Left Cleanser: Soap and Water Every Other Day/30 Days Discharge Instructions: May shower and wash wound with dial antibacterial soap and water prior to dressing change. Peri-Wound Care: Triamcinolone 15 (g) Every Other Day/30 Days Discharge Instructions: Use triamcinolone 15 (g) mixed with zinc oxide Peri-Wound Care: Zinc Oxide Ointment 30g tube Every Other Day/30 Days Discharge Instructions: Apply Zinc Oxide mixed with Triamcinolone to periwound with each dressing change Prim Dressing: Hydrofera Blue Ready Foam, 2.5 x2.5 in Every Other Day/30 Days ary Discharge Instructions: Apply to wound bed as instructed Secondary Dressing: ComfortFoam Border, 4x4 in (silicone border) Every Other Day/30 Days Discharge Instructions: Apply over primary dressing as directed. WOUND #51: - Lower Leg Wound Laterality: Left, Anterior Cleanser: Soap and Water Every Other Day/30 Days Discharge Instructions: May shower and wash wound with dial antibacterial soap and water prior to dressing change. Prim Dressing: KerraCel Ag Gelling Fiber Dressing, 2x2 in (silver alginate) Every Other Day/30 Days ary Discharge Instructions: Apply silver alginate to wound bed as instructed Secondary Dressing: ComfortFoam Border, 4x4 in (silicone border) Every Other Day/30 Days Discharge Instructions: Apply over primary dressing as directed. WOUND #52: - Foot Wound Laterality: Dorsal, Right Cleanser: Soap and Water Every Other Day/30  Days Discharge Instructions: May shower and wash wound with dial antibacterial soap and water prior to dressing change. Prim Dressing:  KerraCel Ag Gelling Fiber Dressing, 2x2 in (silver alginate) Every Other Day/30 Days ary Discharge Instructions: Apply silver alginate to wound bed as instructed Secondary Dressing: Woven Gauze Sponge, Non-Sterile 4x4 in Every Other Day/30 Days Discharge Instructions: Apply over primary dressing as directed. Secondary Dressing: Optifoam Non-Adhesive Dressing, 4x4 in Every Other Day/30 Days Discharge Instructions: Apply foam to dorsal ankle for protection Secured With: Kerlix Roll Sterile, 4.5x3.1 (in/yd) Every Other Day/30 Days Discharge Instructions: Secure with Kerlix as directed. Secured With: 68M Medipore H Soft Cloth Surgical T ape, 2x2 (in/yd) Every Other Day/30 Days Discharge Instructions: Secure dressing with tape as directed. WOUND #53: - T Second Wound Laterality: Plantar, Right oe Cleanser: Soap and Water Every Other Day/30 Days Discharge Instructions: May shower and wash wound with dial antibacterial soap and water prior to dressing change. Prim Dressing: KerraCel Ag Gelling Fiber Dressing, 2x2 in (silver alginate) Every Other Day/30 Days ary Discharge Instructions: Apply silver alginate to wound bed as instructed Secondary Dressing: Woven Gauze Sponge, Non-Sterile 4x4 in Every Other Day/30 Days Discharge Instructions: Apply over primary dressing as directed. Secondary Dressing: Optifoam Non-Adhesive Dressing, 4x4 in Every Other Day/30 Days Discharge Instructions: Apply foam to dorsal ankle for protection Secured With: Kerlix Roll Sterile, 4.5x3.1 (in/yd) Every Other Day/30 Days Discharge Instructions: Secure with Kerlix as directed. Secured With: 68M Medipore H Soft Cloth Surgical T ape, 2x2 (in/yd) Every Other Day/30 Days Discharge Instructions: Secure dressing with tape as directed. WOUND #54: - Ischium Wound Laterality: Left,  Distal Cleanser: Soap and Water Every Other Day/30 Days Discharge Instructions: May shower and wash wound with dial antibacterial soap and water prior to dressing change. Peri-Wound Care: Triamcinolone 15 (g) Every Other Day/30 Days Discharge Instructions: Use triamcinolone 15 (g) mixed with zinc oxide Peri-Wound Care: Zinc Oxide Ointment 30g tube Every Other Day/30 Days Discharge Instructions: Apply Zinc Oxide mixed with Triamcinolone to periwound with each dressing change Prim Dressing: Hydrofera Blue Ready Foam, 2.5 x2.5 in Every Other Day/30 Days ary Discharge Instructions: Apply to wound bed as instructed Secondary Dressing: ComfortFoam Border, 4x4 in (silicone border) Every Other Day/30 Days Discharge Instructions: Apply over primary dressing as directed. 1. I have cultured the left anterior mid tibia although I am not certain that this is bacterial. Could be fungal some form of contact dermatitis. Silver alginate. No empiric oral antibiotics 2. He has [name] large breakdown on his right dorsal foot extending between the first and second toes and to the plantar part of that webspace. We are going to use Lotrisone here and silver alginate 3. The area on his buttock looks about the same. They are using the Coloplast paste and Hydrofera Blue which I have continued. Electronic Signature(s) Signed: 05/11/2021 5:10:27 PM By: Linton Ham MD Entered By: Linton Ham on 05/11/2021 08:59:18 -------------------------------------------------------------------------------- [place] Details Patient Name: Date of Service: [name] [name], [name] [name] 05/11/2021 Medical Record Number: [id number] Patient Account Number: [id number] Date of Birth/Sex: Treating RN: 02/06/1986 (35 y.o. 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 Facility Procedures CPT4 Code: [id number] Description: [place] [place]  VISIT-LEV 5 EST PT Modifier: Quantity: 1 Physician Procedures : CPT4 Code Description Modifier [id number] - WC PHYS LEVEL 3 - EST PT ICD-10 Diagnosis Description L97.511 Non-pressure chronic ulcer of other part of right foot limited to breakdown of skin L97.821 Non-pressure chronic ulcer of other part of left  lower leg limited to breakdown of skin Quantity: 1 Electronic Signature(s) Signed: 05/11/2021 5:10:27 PM By: Linton Ham MD Signed: 05/11/2021 5:37:12 PM By: Levan Hurst RN, BSN Entered By: Levan Hurst on 05/11/2021 10:36:07

## 2021-05-14 NOTE — Progress Notes (Signed)
Umscheid, TAMER BAUGHMAN ([id number]) Visit Report for 05/11/2021 Arrival Information Details Patient Name: Date of Service: Mackowski, [name] 05/11/2021 8:00 A M Medical Record Number: [id number] Patient Account Number: [id number] Date of Birth/Sex: Treating RN: 10-23-1986 (35 y.o. [name] Primary Care Dreyah Montrose: [name], [name] Other Clinician: Referring Jacobie Stamey: Treating Lois Ostrom/Extender: [name]: 12 Visit Information History Since Last Visit Added or deleted any medications: No Patient Arrived: Wheel Chair Any new allergies or adverse reactions: No Arrival Time: 08:03 Had a fall or experienced change in No Accompanied By: self activities of daily living that may affect Transfer Assistance: None risk of falls: Patient Identification Verified: Yes Signs or symptoms of abuse/neglect since last visito No Secondary Verification Process Completed: Yes Hospitalized since last visit: No Patient Requires Transmission-Based Precautions: No Implantable device outside of the clinic excluding No Patient Has Alerts: Yes cellular tissue based products placed in the center Patient Alerts: R ABI = 1.0 since last visit: L ABI = 1.1 Has Dressing in Place as Prescribed: Yes Has Compression in Place as Prescribed: Yes Pain Present Now: No Electronic Signature(s) Signed: 05/11/2021 6:32:01 PM By: Baruch Gouty RN, BSN Entered By: Baruch Gouty on 05/11/2021 08:06:23 -------------------------------------------------------------------------------- Clinic Level of Care Assessment Details Patient Name: Date of Service: Petko, [name] 05/11/2021 8:00 A M Medical Record Number: [id number] Patient Account Number: [id number] Date of Birth/Sex: Treating RN: 1986/02/18 (35 y.o. [name] Primary Care Janicia Monterrosa: [name], [name] Other Clinician: Referring Sema Stangler: Treating Yohann Curl/Extender: [name]: 52 Clinic  Level of Care Assessment Items TOOL 4 Quantity Score X- 1 0 Use when only an EandM is performed on FOLLOW-UP visit ASSESSMENTS - Nursing Assessment / Reassessment X- 1 10 Reassessment of Co-morbidities (includes updates in patient status) X- 1 5 Reassessment of Adherence to Treatment Plan ASSESSMENTS - Wound and Skin A ssessment / Reassessment []  - 0 Simple Wound Assessment / Reassessment - one wound X- 5 5 Complex Wound Assessment / Reassessment - multiple wounds []  - 0 Dermatologic / Skin Assessment (not related to wound area) ASSESSMENTS - Focused Assessment []  - 0 Circumferential Edema Measurements - multi extremities []  - 0 Nutritional Assessment / Counseling / Intervention []  - 0 Lower Extremity Assessment (monofilament, tuning fork, pulses) []  - 0 Peripheral Arterial Disease Assessment (using hand held doppler) ASSESSMENTS - Ostomy and/or Continence Assessment and Care []  - 0 Incontinence Assessment and Management []  - 0 Ostomy Care Assessment and Management (repouching, etc.) PROCESS - Coordination of Care X - Simple Patient / Family Education for ongoing care 1 15 []  - 0 Complex (extensive) Patient / Family Education for ongoing care X- 1 10 Staff obtains Programmer, systems, Records, T Results / Process Orders est []  - 0 Staff telephones HHA, Nursing Homes / Clarify orders / etc []  - 0 Routine Transfer to another Facility (non-emergent condition) []  - 0 Routine Hospital Admission (non-emergent condition) []  - 0 New Admissions / Biomedical engineer / Ordering NPWT Apligraf, etc. , []  - 0 Emergency Hospital Admission (emergent condition) X- 1 10 Simple Discharge Coordination []  - 0 Complex (extensive) Discharge Coordination PROCESS - Special Needs []  - 0 Pediatric / Minor Patient Management []  - 0 Isolation Patient Management []  - 0 Hearing / Language / Visual special needs []  - 0 Assessment of Community assistance (transportation, D/C planning, etc.) []   - 0 Additional assistance / Altered mentation []  - 0 Support Surface(s) Assessment (bed, cushion, seat, etc.) INTERVENTIONS - Wound Cleansing / Measurement []  -  0 Simple Wound Cleansing - one wound X- 5 5 Complex Wound Cleansing - multiple wounds []  - 0 Wound Imaging (photographs - any number of wounds) []  - 0 Wound Tracing (instead of photographs) []  - 0 Simple Wound Measurement - one wound X- 5 5 Complex Wound Measurement - multiple wounds INTERVENTIONS - Wound Dressings X - Small Wound Dressing one or multiple wounds 2 10 X- 1 15 Medium Wound Dressing one or multiple wounds []  - 0 Large Wound Dressing one or multiple wounds []  - 0 Application of Medications - topical []  - 0 Application of Medications - injection INTERVENTIONS - Miscellaneous []  - 0 External ear exam []  - 0 Specimen Collection (cultures, biopsies, blood, body fluids, etc.) []  - 0 Specimen(s) / Culture(s) sent or taken to Lab for analysis []  - 0 Patient Transfer (multiple staff / Civil Service fast streamer / Similar devices) []  - 0 Simple Staple / Suture removal (25 or less) []  - 0 Complex Staple / Suture removal (26 or more) []  - 0 Hypo / Hyperglycemic Management (close monitor of Blood Glucose) []  - 0 Ankle / Brachial Index (ABI) - do not check if billed separately X- 1 5 Vital Signs Has the patient been seen at the hospital within the last three years: Yes Total Score: 165 Level Of Care: New/Established - Level 5 Electronic Signature(s) Signed: 05/11/2021 5:37:12 PM By: Levan Hurst RN, BSN Entered By: Levan Hurst on 05/11/2021 10:35:59 -------------------------------------------------------------------------------- Encounter Discharge Information Details Patient Name: Date of Service: Co, [name] 05/11/2021 8:00 A M Medical Record Number: [id number] Patient Account Number: [id number] Date of Birth/Sex: Treating RN: Dec 11, 1985 (35 y.o. [name] Primary Care Sonya Pucci: [place], [place] Other  Clinician: Referring Raychelle Hudman: Treating Gimena Buick/Extender: [name]: 279 Encounter Discharge Information Items Discharge Condition: Stable Ambulatory Status: Wheelchair Discharge Destination: Home Transportation: Private Auto Accompanied By: alone Schedule Follow-up Appointment: Yes Clinical Summary of Care: Patient Declined Electronic Signature(s) Signed: 05/11/2021 5:37:12 PM By: Levan Hurst RN, BSN Entered By: Levan Hurst on 05/11/2021 10:36:58 -------------------------------------------------------------------------------- Lower Extremity Assessment Details Patient Name: Date of Service: Brugh, [name] 05/11/2021 8:00 A M Medical Record Number: [id number] Patient Account Number: [id number] Date of Birth/Sex: Treating RN: 08-29-1986 (35 y.o. [name] Primary Care Moe Graca: [place], [place] Other Clinician: Referring Latavion Halls: Treating Kyrah Schiro/Extender: [name]: 279 Edema Assessment Assessed: [Left: No] [Right: No] Edema: [Left: Yes] [Right: Yes] Calf Left: Right: Point of Measurement: 33 cm From Medial Instep 27 cm 35.2 cm Ankle Left: Right: Point of Measurement: 10 cm From Medial Instep 23.5 cm 23.5 cm Vascular Assessment Pulses: Dorsalis Pedis Palpable: [Left:Yes] [Right:Yes] Electronic Signature(s) Signed: 05/11/2021 6:32:01 PM By: Baruch Gouty RN, BSN Entered By: Baruch Gouty on 05/11/2021 08:15:03 -------------------------------------------------------------------------------- Multi Wound Chart Details Patient Name: Date of Service: Dufford, [name] 05/11/2021 8:00 A M Medical Record Number: [id number] Patient Account Number: [id number] Date of Birth/Sex: Treating RN: January 02, 1986 (35 y.o. [name] Primary Care Diandre Merica: [name], [name] Other Clinician: Referring Press Casale: Treating Raeven Pint/Extender: [name]:  279 Vital Signs Height(in): 70 Pulse(bpm): 114 Weight(lbs): 216 Blood Pressure(mmHg): 133/65 Body Mass Index(BMI): 31 Temperature(F): 97.8 Respiratory Rate(breaths/min): 18 Photos: [41R:No Photos Left Ischium] [51:No Photos Left, Anterior Lower Leg] [52:No Photos Right, Dorsal Foot] Wound Location: [41R:Gradually Appeared] [51:Trauma] [52:Gradually Appeared] Wounding Event: [41R:Pressure Ulcer] [51:Trauma, Other] [52:Trauma, Other] Primary Etiology: [41R:Sleep Apnea, Hypertension, Paraplegia Sleep Apnea, Hypertension, Paraplegia Sleep Apnea, Hypertension, Paraplegia] Comorbid History: [41R:03/16/2020] [51:01/26/2021] [  52:03/27/2021] Date Acquired: [41R:60] [51:12] [52:6] Weeks of Treatment: [41R:Open] [51:Open] [52:Open] Wound Status: [41R:Yes] [51:No] [52:No] Wound Recurrence: [41R:Yes] [51:No] [52:No] Clustered Wound: [41R:2] [51:N/A] [52:N/A] Clustered Quantity: [41R:2.2x2.7x0.1] [51:0.7x0.7x0.1] [52:0.5x1.4x0.1] Measurements L x W x D (cm) [41R:4.665] [51:0.385] [52:0.55] A (cm) : rea [41R:0.467] [51:0.038] [52:0.055] Volume (cm) : [41R:-158.30%] [51:-16.70%] [52:70.80%] % Reduction in A rea: [41R:-158.00%] [51:-15.20%] [52:70.70%] % Reduction in Volume: [41R:Category/Stage II] [51:Full Thickness Without Exposed] [52:Full Thickness Without Exposed] Classification: [41R:Medium] [51:Support Structures Medium] [52:Support Structures Medium] Exudate Amount: [41R:Serosanguineous] [51:Serosanguineous] [52:Sanguinous] Exudate Type: [41R:red, brown] [51:red, brown] [52:red] Exudate Color: [41R:Flat and Intact] [51:Flat and Intact] [52:Distinct, outline attached] Wound Margin: [41R:Large (67-100%)] [51:Large (67-100%)] [52:Small (1-33%)] Granulation Amount: [41R:Red, Pink, Friable] [51:Red, Friable] [52:Red] Granulation Quality: [41R:Small (1-33%)] [51:Small (1-33%)] [52:Large (67-100%)] Necrotic Amount: [41R:Fat Layer (Subcutaneous Tissue): Yes Fat Layer (Subcutaneous Tissue): Yes Fat  Layer (Subcutaneous Tissue): Yes] Exposed Structures: [41R:Fascia: No Tendon: No Muscle: No Joint: No Bone: No Small (1-33%)] [51:Fascia: No Tendon: No Muscle: No Joint: No Bone: No Small (1-33%)] [52:Fascia: No Tendon: No Muscle: No Joint: No Bone: No None] Wound Number: 1 [id number] N/A Photos: No Photos No Photos N/A Right, Plantar T Second oe Left, Distal Ischium N/A Wound Location: Gradually Appeared Pressure Injury N/A Wounding Event: Lymphedema Pressure Ulcer N/A Primary Etiology: Sleep Apnea, Hypertension, Paraplegia Sleep Apnea, Hypertension, Paraplegia N/A Comorbid History: 05/11/2021 05/11/2021 N/A Date Acquired: 0 0 N/A Weeks of Treatment: Open Open N/A Wound Status: No No N/A Wound Recurrence: No No N/A Clustered Wound: N/A N/A N/A Clustered Quantity: 1.5x1x0.1 1.8x1x0.1 N/A Measurements L x W x D (cm) 1.178 1.414 N/A A (cm) : rea 0.118 0.141 N/A Volume (cm) : N/A N/A N/A % Reduction in Area: N/A N/A N/A % Reduction in Volume: Full Thickness Without Exposed Category/Stage III N/A Classification: Support Structures Medium Medium N/A Exudate Amount: Serosanguineous Serosanguineous N/A Exudate Type: red, brown red, brown N/A Exudate Color: Flat and Intact Flat and Intact N/A Wound Margin: Small (1-33%) Large (67-100%) N/A Granulation Amount: Pink Red, Friable N/A Granulation Quality: Large (67-100%) None Present (0%) N/A Necrotic Amount: Fat Layer (Subcutaneous Tissue): Yes Fat Layer (Subcutaneous Tissue): Yes N/A Exposed Structures: Fascia: No Fascia: No Tendon: No Tendon: No Muscle: No Muscle: No Joint: No Joint: No Bone: No Bone: No Medium (34-66%) Small (1-33%) N/A Epithelialization: Treatment Notes Electronic Signature(s) Signed: 05/11/2021 5:10:27 PM By: Linton Ham MD Signed: 05/11/2021 5:37:12 PM By: Levan Hurst RN, BSN Entered By: Linton Ham on 05/11/2021  08:53:41 -------------------------------------------------------------------------------- Multi-Disciplinary Care Plan Details Patient Name: Date of Service: Widener, [name] 05/11/2021 8:00 A M Medical Record Number: [id number] Patient Account Number: [id number] Date of Birth/Sex: Treating RN: October 17, 1986 (35 y.o. [name] Primary Care Shaiden Aldous: [name], [name] Other Clinician: Referring Elzia Hott: Treating Marik Sedore/Extender: [name]: [place] reviewed with physician Active Inactive Wound/Skin Impairment Nursing Diagnoses: Impaired tissue integrity Knowledge deficit related to ulceration/compromised skin integrity Goals: Patient/caregiver will verbalize understanding of skin care regimen Date Initiated: 01/05/2016 Target Resolution Date: 06/05/2021 Goal Status: Active Ulcer/skin breakdown will have a volume reduction of 30% by week 4 Date Initiated: 01/05/2016 Date Inactivated: 12/22/2017 Target Resolution Date: 01/19/2018 Unmet Reason: complex wounds, Goal Status: Unmet infection Interventions: Assess patient/caregiver ability to obtain necessary supplies Assess ulceration(s) every visit Provide education on ulcer and skin care Notes: 02/02/21: Complex Care, ongoing. Electronic Signature(s) Signed: 05/11/2021 5:37:12 PM By: Levan Hurst RN, BSN Entered By: Levan Hurst on 05/11/2021 10:34:42 -------------------------------------------------------------------------------- Pain Assessment Details Patient  Name: Date of Service: Kinch, [name] 05/11/2021 8:00 A M Medical Record Number: [id number] Patient Account Number: [id number] Date of Birth/Sex: Treating RN: [birth year] (35 y.o. [name] Primary Care Camryn Lampson: [place], [place] Other Clinician: Referring Oyinkansola Truax: Treating Zoey Bidwell/Extender: [name]: 279 Active Problems Location of Pain Severity and  Description of Pain Patient Has Paino No Site Locations Rate the pain. Current Pain Level: 0 Pain Management and Medication Current Pain Management: Electronic Signature(s) Signed: 05/11/2021 6:32:01 PM By: Baruch Gouty RN, BSN Entered By: Baruch Gouty on 05/11/2021 08:08:59 -------------------------------------------------------------------------------- Patient/Caregiver Education Details Patient Name: Date of Service: Wahlert, A Viviann Spare 6/20/2022andnbsp8:00 A M Medical Record Number: [id number] Patient Account Number: [id number] Date of Birth/Gender: Treating RN: Aug 09, 1986 (35 y.o. [name] Primary Care Physician: Janine Limbo Other Clinician: Referring Physician: Treating Physician/Extender: [name]: 29 Education Assessment Education Provided To: Patient Education Topics Provided Wound/Skin Impairment: Methods: Explain/Verbal Responses: State content correctly Electronic Signature(s) Signed: 05/11/2021 5:37:12 PM By: Levan Hurst RN, BSN Entered By: Levan Hurst on 05/11/2021 10:34:57 -------------------------------------------------------------------------------- Wound Assessment Details Patient Name: Date of Service: Gianfrancesco, [name] 05/11/2021 8:00 A M Medical Record Number: [id number] Patient Account Number: [id number] Date of Birth/Sex: Treating RN: [birth year] (35 y.o. [name] Primary Care Kierstan Auer: [name], [name] Other Clinician: Referring Mehtaab Mayeda: Treating Camreigh Michie/Extender: [name]: 279 Wound Status Wound Number: [id number] Primary Etiology: Pressure Ulcer Wound Location: Left Ischium Wound Status: Open Wounding Event: Gradually Appeared Comorbid History: Sleep Apnea, Hypertension, Paraplegia Date Acquired: 03/16/2020 Weeks Of Treatment: 60 Clustered Wound: Yes Photos Wound Measurements Length: (cm) 2.2 Width: (cm) 2.7 Depth: (cm) 0.1 Clustered  Quantity: 2 Area: (cm) 4.665 Volume: (cm) 0.467 % Reduction in Area: -158.3% % Reduction in Volume: -158% Epithelialization: Small (1-33%) Tunneling: No Undermining: No Wound Description Classification: Category/Stage II Wound Margin: Flat and Intact Exudate Amount: Medium Exudate Type: Serosanguineous Exudate Color: red, brown Foul Odor After Cleansing: No Slough/Fibrino Yes Wound Bed Granulation Amount: Large (67-100%) Exposed Structure Granulation Quality: Red, Pink, Friable Fascia Exposed: No Necrotic Amount: Small (1-33%) Fat Layer (Subcutaneous Tissue) Exposed: Yes Necrotic Quality: Adherent Slough Tendon Exposed: No Muscle Exposed: No Joint Exposed: No Bone Exposed: No Treatment Notes Wound #[id number] (Ischium) Wound Laterality: Left Cleanser Soap and Water Discharge Instruction: May shower and wash wound with dial antibacterial soap and water prior to dressing change. Peri-Wound Care Triamcinolone 15 (g) Discharge Instruction: Use triamcinolone 15 (g) mixed with zinc oxide Zinc Oxide Ointment 30g tube Discharge Instruction: Apply Zinc Oxide mixed with Triamcinolone to periwound with each dressing change Topical Primary Dressing Hydrofera Blue Ready Foam, 2.5 x2.5 in Discharge Instruction: Apply to wound bed as instructed Secondary Dressing ComfortFoam Border, 4x4 in (silicone border) Discharge Instruction: Apply over primary dressing as directed. Secured With Compression Wrap Compression Stockings Environmental education officer) Signed: 05/11/2021 6:32:01 PM By: Baruch Gouty RN, BSN Signed: 05/14/2021 8:38:46 AM By: Leane Call Entered By: Leane Call on 05/11/2021 16:10:00 -------------------------------------------------------------------------------- Wound Assessment Details Patient Name: Date of Service: Mcgourty, [name] 05/11/2021 8:00 A M Medical Record Number: [id number] Patient Account Number: [id number] Date of Birth/Sex: Treating  RN: Sep 02, 1986 (35 y.o. [name] Primary Care Glennie Rodda: [place], [place] Other Clinician: Referring Teondra Newburg: Treating Alysa Duca/Extender: [name]: 279 Wound Status Wound Number: 62 Primary Etiology: Trauma, Other Wound Location: Left, Anterior Lower Leg Wound Status: Open Wounding Event: Trauma Comorbid History: Sleep Apnea, Hypertension, Paraplegia Date  Acquired: 01/26/2021 Weeks Of Treatment: 12 Clustered Wound: No Photos Wound Measurements Length: (cm) 0.7 Width: (cm) 0.7 Depth: (cm) 0.1 Area: (cm) 0.385 Volume: (cm) 0.038 % Reduction in Area: -16.7% % Reduction in Volume: -15.2% Epithelialization: Small (1-33%) Tunneling: No Undermining: No Wound Description Classification: Full Thickness Without Exposed Support Structures Wound Margin: Flat and Intact Exudate Amount: Medium Exudate Type: Serosanguineous Exudate Color: red, brown Foul Odor After Cleansing: No Slough/Fibrino No Wound Bed Granulation Amount: Large (67-100%) Exposed Structure Granulation Quality: Red, Friable Fascia Exposed: No Necrotic Amount: Small (1-33%) Fat Layer (Subcutaneous Tissue) Exposed: Yes Necrotic Quality: Adherent Slough Tendon Exposed: No Muscle Exposed: No Joint Exposed: No Bone Exposed: No Treatment Notes Wound #51 (Lower Leg) Wound Laterality: Left, Anterior Cleanser Soap and Water Discharge Instruction: May shower and wash wound with dial antibacterial soap and water prior to dressing change. Peri-Wound Care Topical Primary Dressing KerraCel Ag Gelling Fiber Dressing, 2x2 in (silver alginate) Discharge Instruction: Apply silver alginate to wound bed as instructed Secondary Dressing ComfortFoam Border, 4x4 in (silicone border) Discharge Instruction: Apply over primary dressing as directed. Secured With Compression Wrap Compression Stockings Environmental education officer) Signed: 05/11/2021 6:32:01 PM By: Baruch Gouty RN, BSN Signed: 05/14/2021 8:38:46 AM By: Leane Call Entered By: Leane Call on 05/11/2021 16:08:21 -------------------------------------------------------------------------------- Wound Assessment Details Patient Name: Date of Service: Araque, [name] 05/11/2021 8:00 A M Medical Record Number: [id number] Patient Account Number: [id number] Date of Birth/Sex: Treating RN: 1986/09/19 (35 y.o. [name] Primary Care Bowie Doiron: [name], [name] Other Clinician: Referring Cecil Vandyke: Treating Delesia [name]/Extender: [name]: 279 Wound Status Wound Number: 52 Primary Etiology: Trauma, Other Wound Location: Right, Dorsal Foot Wound Status: Open Wounding Event: Gradually Appeared Comorbid History: Sleep Apnea, Hypertension, Paraplegia Date Acquired: 03/27/2021 Weeks Of Treatment: 6 Clustered Wound: No Photos Wound Measurements Length: (cm) 0.5 Width: (cm) 1.4 Depth: (cm) 0.1 Area: (cm) 0.55 Volume: (cm) 0.055 % Reduction in Area: 70.8% % Reduction in Volume: 70.7% Epithelialization: None Tunneling: No Undermining: No Wound Description Classification: Full Thickness Without Exposed Support Structures Wound Margin: Distinct, outline attached Exudate Amount: Medium Exudate Type: Sanguinous Exudate Color: red Foul Odor After Cleansing: No Slough/Fibrino No Wound Bed Granulation Amount: Small (1-33%) Exposed Structure Granulation Quality: Red Fascia Exposed: No Necrotic Amount: Large (67-100%) Fat Layer (Subcutaneous Tissue) Exposed: Yes Necrotic Quality: Adherent Slough Tendon Exposed: No Muscle Exposed: No Joint Exposed: No Bone Exposed: No Treatment Notes Wound #52 (Foot) Wound Laterality: Dorsal, Right Cleanser Soap and Water Discharge Instruction: May shower and wash wound with dial antibacterial soap and water prior to dressing change. Peri-Wound Care Topical Primary Dressing KerraCel Ag Gelling Fiber  Dressing, 2x2 in (silver alginate) Discharge Instruction: Apply silver alginate to wound bed as instructed Secondary Dressing Woven Gauze Sponge, Non-Sterile 4x4 in Discharge Instruction: Apply over primary dressing as directed. Optifoam Non-Adhesive Dressing, 4x4 in Discharge Instruction: Apply foam to dorsal ankle for protection Secured With Kerlix Roll Sterile, 4.5x3.1 (in/yd) Discharge Instruction: Secure with Kerlix as directed. 42M Medipore H Soft Cloth Surgical T ape, 2x2 (in/yd) Discharge Instruction: Secure dressing with tape as directed. Compression Wrap Compression Stockings Add-Ons Electronic Signature(s) Signed: 05/11/2021 6:32:01 PM By: Baruch Gouty RN, BSN Signed: 05/14/2021 8:38:46 AM By: Leane Call Entered By: Leane Call on 05/11/2021 16:07:31 -------------------------------------------------------------------------------- Wound Assessment Details Patient Name: Date of Service: Bruski, [name] 05/11/2021 8:00 A M Medical Record Number: [id number] Patient Account Number: [id number] Date of Birth/Sex: Treating RN: [birth year] (35 y.o. [name] Primary  Care Marium Ragan: [name], [name] Other Clinician: Referring Janith Nielson: Treating Tlaloc Taddei/Extender: [name]: 279 Wound Status Wound Number: 53 Primary Etiology: Lymphedema Wound Location: Right, Plantar T Second oe Wound Status: Open Wounding Event: Gradually Appeared Comorbid History: Sleep Apnea, Hypertension, Paraplegia Date Acquired: 05/11/2021 Weeks Of Treatment: 0 Clustered Wound: No Photos Wound Measurements Length: (cm) 1.5 Width: (cm) 1 Depth: (cm) 0.1 Area: (cm) 1.178 Volume: (cm) 0.118 % Reduction in Area: 0% % Reduction in Volume: 0% Epithelialization: Medium (34-66%) Tunneling: No Undermining: No Wound Description Classification: Full Thickness Without Exposed Support Structures Wound Margin: Flat and Intact Exudate Amount:  Medium Exudate Type: Serosanguineous Exudate Color: red, brown Foul Odor After Cleansing: No Slough/Fibrino Yes Wound Bed Granulation Amount: Small (1-33%) Exposed Structure Granulation Quality: Pink Fascia Exposed: No Necrotic Amount: Large (67-100%) Fat Layer (Subcutaneous Tissue) Exposed: Yes Necrotic Quality: Adherent Slough Tendon Exposed: No Muscle Exposed: No Joint Exposed: No Bone Exposed: No Treatment Notes Wound #53 (Toe Second) Wound Laterality: Plantar, Right Cleanser Soap and Water Discharge Instruction: May shower and wash wound with dial antibacterial soap and water prior to dressing change. Peri-Wound Care Topical Primary Dressing KerraCel Ag Gelling Fiber Dressing, 2x2 in (silver alginate) Discharge Instruction: Apply silver alginate to wound bed as instructed Secondary Dressing Woven Gauze Sponge, Non-Sterile 4x4 in Discharge Instruction: Apply over primary dressing as directed. Optifoam Non-Adhesive Dressing, 4x4 in Discharge Instruction: Apply foam to dorsal ankle for protection Secured With Kerlix Roll Sterile, 4.5x3.1 (in/yd) Discharge Instruction: Secure with Kerlix as directed. 25M Medipore H Soft Cloth Surgical T ape, 2x2 (in/yd) Discharge Instruction: Secure dressing with tape as directed. Compression Wrap Compression Stockings Add-Ons Electronic Signature(s) Signed: 05/11/2021 6:32:01 PM By: Baruch Gouty RN, BSN Signed: 05/14/2021 8:38:46 AM By: Leane Call Entered By: Leane Call on 05/11/2021 16:09:12 -------------------------------------------------------------------------------- Wound Assessment Details Patient Name: Date of Service: [place], [name] 05/11/2021 8:00 A M Medical Record Number: [id number] Patient Account Number: [id number] Date of Birth/Sex: Treating RN: Jul 11, 1986 (35 y.o. [name] Primary Care Lelan Cush: [name], [name] Other Clinician: Referring Najat Olazabal: Treating Rowan Pollman/Extender: [name]: 279 Wound Status Wound Number: [id number] Primary Etiology: Pressure Ulcer Wound Location: Left, Distal Ischium Wound Status: Open Wounding Event: Pressure Injury Comorbid History: Sleep Apnea, Hypertension, Paraplegia Date Acquired: 05/11/2021 Weeks Of Treatment: 0 Clustered Wound: No Photos Wound Measurements Length: (cm) 1.8 Width: (cm) 1 Depth: (cm) 0.1 Area: (cm) 1.414 Volume: (cm) 0.141 % Reduction in Area: 0% % Reduction in Volume: 0% Epithelialization: Small (1-33%) Tunneling: No Undermining: No Wound Description Classification: Category/Stage III Wound Margin: Flat and Intact Exudate Amount: Medium Exudate Type: Serosanguineous Exudate Color: red, brown Wound Bed Granulation Amount: Large (67-100%) Exposed Structure Granulation Quality: Red, Friable Fascia Exposed: No Necrotic Amount: None Present (0%) Fat Layer (Subcutaneous Tissue) Exposed: Yes Tendon Exposed: No Muscle Exposed: No Joint Exposed: No Bone Exposed: No Treatment Notes Wound #54 (Ischium) Wound Laterality: Left, Distal Cleanser Soap and Water Discharge Instruction: May shower and wash wound with dial antibacterial soap and water prior to dressing change. Peri-Wound Care Triamcinolone 15 (g) Discharge Instruction: Use triamcinolone 15 (g) mixed with zinc oxide Zinc Oxide Ointment 30g tube Discharge Instruction: Apply Zinc Oxide mixed with Triamcinolone to periwound with each dressing change Topical Primary Dressing Hydrofera Blue Ready Foam, 2.5 x2.5 in Discharge Instruction: Apply to wound bed as instructed Secondary Dressing ComfortFoam Border, 4x4 in (silicone border) Discharge Instruction: Apply over primary dressing as directed. Secured With Compression Wrap Compression Stockings  Add-Ons Electronic Signature(s) Signed: 05/11/2021 6:32:01 PM By: Baruch Gouty RN, BSN Signed: 05/14/2021 8:38:46 AM By: Leane Call Entered By: Leane Call on 05/11/2021 16:10:47 -------------------------------------------------------------------------------- Vitals Details Patient Name: Date of Service: [place], [name] 05/11/2021 8:00 A M Medical Record Number: [id number] Patient Account Number: [id number] Date of Birth/Sex: Treating RN: 1986/07/02 (35 y.o. [name] Primary Care Estiben Mizuno: Landa, [place] Other Clinician: Referring Kindsey Eblin: Treating Tian Davison/Extender: [name]: 279 Vital Signs Time Taken: 08:08 Temperature (F): 97.8 Height (in): 70 Pulse (bpm): 114 Source: Stated Respiratory Rate (breaths/min): 18 Weight (lbs): 216 Blood Pressure (mmHg): 133/65 Source: Stated Reference Range: 80 - 120 mg / dl Body Mass Index (BMI): 31 Electronic Signature(s) Signed: 05/11/2021 6:32:01 PM By: Baruch Gouty RN, BSN Entered By: Baruch Gouty on 05/11/2021 02:54:27

## 2021-05-16 LAB — AEROBIC CULTURE W GRAM STAIN (SUPERFICIAL SPECIMEN)

## 2021-05-26 ENCOUNTER — Encounter (HOSPITAL_BASED_OUTPATIENT_CLINIC_OR_DEPARTMENT_OTHER): Payer: 59 | Attending: Internal Medicine | Admitting: Internal Medicine

## 2021-05-26 ENCOUNTER — Other Ambulatory Visit: Payer: Self-pay

## 2021-05-26 DIAGNOSIS — S31829A Unspecified open wound of left buttock, initial encounter: Secondary | ICD-10-CM | POA: Insufficient documentation

## 2021-05-26 DIAGNOSIS — L89623 Pressure ulcer of left heel, stage 3: Secondary | ICD-10-CM | POA: Diagnosis not present

## 2021-05-26 DIAGNOSIS — L03115 Cellulitis of right lower limb: Secondary | ICD-10-CM | POA: Diagnosis not present

## 2021-05-26 DIAGNOSIS — I890 Lymphedema, not elsewhere classified: Secondary | ICD-10-CM | POA: Insufficient documentation

## 2021-05-26 DIAGNOSIS — L89323 Pressure ulcer of left buttock, stage 3: Secondary | ICD-10-CM | POA: Insufficient documentation

## 2021-05-26 DIAGNOSIS — L97521 Non-pressure chronic ulcer of other part of left foot limited to breakdown of skin: Secondary | ICD-10-CM | POA: Insufficient documentation

## 2021-05-26 DIAGNOSIS — I872 Venous insufficiency (chronic) (peripheral): Secondary | ICD-10-CM | POA: Diagnosis not present

## 2021-05-26 DIAGNOSIS — L97821 Non-pressure chronic ulcer of other part of left lower leg limited to breakdown of skin: Secondary | ICD-10-CM | POA: Insufficient documentation

## 2021-05-26 DIAGNOSIS — L97511 Non-pressure chronic ulcer of other part of right foot limited to breakdown of skin: Secondary | ICD-10-CM | POA: Insufficient documentation

## 2021-05-26 DIAGNOSIS — G8221 Paraplegia, complete: Secondary | ICD-10-CM | POA: Diagnosis not present

## 2021-05-29 NOTE — Progress Notes (Signed)
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 some on the lesser 50%. Wound bed appears healthy. The area between the first and second toe on the right still remains problematic 04/21/17; continued very gradual improvement. Using [facility] 04/28/17; continued very gradual improvement in the left lateral leg venous insufficiency wound. His periwound erythema is very mild. We have been using Hydrofera Blue. Wound is making progress especially in the superior 50% 05/05/17; he continues to have very gradual improvement in the left lateral venous insufficiency wound. Both in terms with an length rings are improving. I debrided this every 2 weeks with #5 curet and we have been using Hydrofera Blue and again making good progress With regards to the wounds between his right first and second toe which I thought might of been tinea pedis he is not making as much progress very dry scaly skin over the area. Also the area at the base of the left first and second toe in [name] similar condition 05/12/17; continued gradual improvement in the refractory left lateral venous insufficiency wound on the left. Dimension smaller. Surface still requiring debridement using Hydrofera Blue 05/19/17; continued gradual improvement in the refractory left lateral venous ulceration. Careful inspection of the wound bed underlying rumination suggested some degree of epithelialization over the surface no debridement indicated. Continue Hydrofera Blue difficult areas between his  toes first and third on the left than first and second on the right. I'm going to change to silver alginate from silver collagen. Continue ketoconazole as I suspect underlying tinea pedis 05/26/17; left lateral leg venous insufficiency wound. We've been using Hydrofera Blue. I believe that there is expanding epithelialization over the surface of the wound albeit not coming from the wound circumference. This is [name] bit of an odd situation in which the epithelialization seems  to be coming from the surface of the wound rather than in the exact circumference. There is still small open areas mostly along the lateral margin of the wound. He has unchanged areas between the left first and second and the right first second toes which I been treating for tenia pedis 06/02/17; left lateral leg venous insufficiency wound. We have been using Hydrofera Blue. Somewhat smaller from the wound circumference. The surface of the wound remains [name] bit on it almost epithelialized sedation in appearance. I use an open curette today debridement in the surface of all of this especially the edges Small open wounds remaining on the dorsal right first and second toe interspace and the plantar left first second toe and her face on the left 06/09/17; wound on the left lateral leg continues to be smaller but very gradual and very dry surface using Hydrofera Blue 06/16/17 requires weekly debridements now on the left lateral leg although this continues to contract. I changed to silver collagen last week because of dryness of the wound bed. Using Iodoflex to the areas on his first and second toes/web space bilaterally 06/24/17; patient with history of paraplegia also chronic venous insufficiency with lymphedema. Has [name] very difficult wound on the left lateral leg. This has been gradually reducing in terms of with but comes in with [name] very dry adherent surface. High switch to silver collagen [name] week or so ago with hydrogel to keep the area moist.  This is been refractory to multiple dressing attempts. He also has areas in his first and second toes bilaterally in the anterior and posterior web space. I had been using Iodoflex here after [name] prolonged course of silver alginate with ketoconazole was ineffective [question tinea pedis] 07/14/17; patient arrives today with [name] very difficult adherent material over his left lateral lower leg wound. He also has surrounding erythema and poorly controlled edema. He was switched his Santyl last visit which the nurses are applying once during his doctor visit and once on [name] nurse visit. He was also reduced to 2 layer compression I'm not exactly sure of the issue here. 07/21/17; better surface today after 1 week of Iodoflex. Significant cellulitis that we treated last week also better. [Doxycycline] 07/28/17 better surface today with now 2 weeks of Iodoflex. Significant cellulitis treated with doxycycline. He has now completed the doxycycline and he is back to his usual degree of chronic venous inflammation/stasis dermatitis. He reminds me he has had ablations surgery here 08/04/17; continued improvement with Iodoflex to the left lateral leg wound in terms of the surface of the wound although the dimensions are better. 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 Problematic areas on the plantar aspect of the first second toe web space on the left and the dorsal aspect of the first second toe web space on the right. At one point I felt these were probably related to chronic fungal infections in treated him aggressively for this although we have not made any improvement here. 08/11/17; left lateral leg. Surface continues to improve with the Iodoflex although we are not seeing much improvement in overall wound dimensions. Areas on his plantar left foot and right foot show no improvement. In fact the right foot looks somewhat worse 08/18/17; left lateral  leg. We changed to [facility] Blue last week after [name] prolonged course of Iodoflex which helps get the surface better. It appears that the wound with is improved. Continue with difficult areas on  the left dorsal first second and plantar first second on the right 09/01/17; patient arrives in clinic today having had [name] temperature of 103 yesterday. He was seen in the ER and [facility]. The patient was concerned he could have cellulitis again in the right leg however they diagnosed him with [name] UTI and he is now on Keflex. He has [name] history of cellulitis which is been recurrent and difficult but this is been in the left leg, in the past 5 use doxycycline. He does in and out catheterizations at home which are risk factors for UTI 09/08/17; patient will be completing his Keflex this weekend. The erythema on the left leg is considerably better. He has [name] new wound today on the medial part of the right leg small superficial almost looks like [name] skin tear. He has worsening of the area on the right dorsal first and second toe. His major area on the left lateral leg is better. Using Hydrofera Blue on all areas 09/15/17; gradual reduction in width on the long wound in the left lateral leg. No debridement required. He also has wounds on the plantar aspect of his left first second toe web space and on the dorsal aspect of the right first second toe web space. 09/22/17; there continues to be very gradual improvements in the dimensions of the left lateral leg wound. He hasn't round erythematous spot with might be pressure on his wheelchair. There is no evidence obviously of infection no purulence no warmth He has [name] dry scaled area on the plantar aspect of the left first second toe Improved area on the dorsal right first second toe. 09/29/17; left lateral leg wound continues to improve in dimensions mostly with an is still [name] fairly long but increasingly narrow wound. He has [name] dry scaled area on the plantar aspect of his left  first second toe web space Increasingly concerning area on the dorsal right first second toe. In fact I am concerned today about possible cellulitis around this wound. The areas extending up his second toe and although there is deformities here almost appears to abut on the nailbed. 10/06/17; left lateral leg wound continues to make very gradual progress. Tissue culture I did from the right first second toe dorsal foot last time grew MRSA and enterococcus which was vancomycin sensitive. This was not sensitive to clindamycin or doxycycline. He is allergic to Zyvox and sulfa we have therefore arrange for him to have dalvance infusion tomorrow. He is had this in the past and tolerated it well 10/20/17; left lateral leg wound continues to make decent progress. This is certainly reduced in terms of with there is advancing epithelialization.The cellulitis in the right foot looks better although he still has [name] deep wound in the dorsal aspect of the first second toe web space. Plantar left first toe web space on the left I think is making some progress 10/27/17; left lateral leg wound continues to make decent progress. Advancing epithelialization.using Hydrofera Blue The right first second toe web space wound is better-looking using silver alginate Improvement in the left plantar first second toe web space. Again using silver alginate 11/03/17 left lateral leg wound continues to make decent progress albeit slowly. Using [facility] The right per second toe web space continues to be [name] very problematic looking punched out wound. I obtained [name] piece of tissue for deep culture I did extensively treated this for fungus. It is difficult to imagine that this is [name] pressure area as the patient states other  than going outside he doesn't really wear shoes at home The left plantar first second toe web space looked fairly senescent. Necrotic edges. This required debridement change to [facility] Blue to all wound  areas 11/10/17; left lateral leg wound continues to contract. Using Hydrofera Blue On the right dorsal first second toe web space dorsally. Culture I did of this area last week grew MRSA there is not an easy oral option in this patient was multiple antibiotic allergies or intolerances. This was only [name] rare culture isolate I'm therefore going to use Bactroban under silver alginate On the left plantar first second toe web space. Debridement is required here. This is also unchanged 11/17/17; left lateral leg wound continues to contract using Hydrofera Blue this is no longer the major issue. The major concern here is the right first second toe web space. He now has an open area going from dorsally to the plantar aspect. There is now wound on the inner lateral part of the first toe. Not [name] very viable surface on this. There is erythema spreading medially into the forefoot. No major change in the left first second toe plantar wound 11/24/17; left lateral leg wound continues to contract using Hydrofera Blue. Nice improvement today The right first second toe web space all of this looks [name] lot less angry than last week. I have given him clindamycin and topical Bactroban for MRSA and terbinafine for the possibility of underlining tinea pedis that I could not control with ketoconazole. Looks somewhat better The area on the plantar left first second toe web space is weeping with dried debris around the wound 12/01/17; left lateral leg wound continues to contract he Hydrofera Blue. It is becoming thinner in terms of with nevertheless it is making good improvement. The right first second toe web space looks less angry but still [name] large necrotic-looking wounds starting on the plantar aspect of the right foot extending between the toes and now extensively on the base of the right second toe. I gave him clindamycin and topical Bactroban for MRSA anterior benefiting for the possibility of underlying tinea pedis. Not  looking better today The area on the left first/second toe looks better. Debrided of necrotic debris 12/05/17* the patient was worked in urgently today because over the weekend he found blood on his incontinence bad when he woke up. He was found to have an ulcer by his wife who does most of his wound care. He came in today for [place] to look at this. He has not had [name] history of wounds in his buttocks in spite of his paraplegia. 12/08/17; seen in follow-up today at his usual appointment. He was seen earlier this week and found to have [name] new wound on his buttock. We also follow him for wounds on the left lateral leg, left first second toe web space and right first second toe web space 12/15/17; we have been using Hydrofera Blue to the left lateral leg which has improved. The right first second toe web space has also improved. Left first second toe web space plantar aspect looks stable. The left buttock has worsened using Santyl. Apparently the buttock has drainage 12/22/17; we have been using Hydrofera Blue to the left lateral leg which continues to improve now 2 small wounds separated by normal skin. He tells [place] he had [name] fever up to 100 yesterday he is prone to UTIs but has not noted anything different. He does in and out catheterizations. The area between the first and second toes  today does not look good necrotic surface covered with what looks to be purulent drainage and erythema extending into the third toe. I had gotten this to something that I thought look better last time however it is not look good today. He also has [name] necrotic surface over the buttock wound which is expanded. I thought there might be infection under here so I removed [name] lot of the surface with [name] #5 curet though nothing look like it really needed culturing. He is been using Santyl to this area 12/27/17; his original wound on the left lateral leg continues to improve using Hydrofera Blue. I gave him samples of Baxdella although he was  unable to take them out of fear for an allergic reaction ["lump in his throat"].the culture I did of the purulent drainage from his second toe last week showed both enterococcus and [name] set Enterobacter I was also concerned about the erythema on the bottom of his foot although paradoxically although this looks somewhat better today. 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 We've been using silver alginate here. They came up today that he sleeps in [name] recliner, I'm not sure why but I asked him to stop this 01/03/18; his original wound we've been using Hydrofera Blue is now separated into 2 areas. Ulcer on his left buttock is better he is off the recliner and sleeping in bed Finally both wound areas between his first and second toes also looks some better 01/10/18; his original wound on the left lateral leg is now separated into 2 wounds we've been using Hydrofera Blue Ulcer on his left buttock has some drainage. There is [name] small probing site going into muscle layer superiorly.using silver alginate -He arrives today with [name] deep tissue injury on the left heel The wound on the dorsal aspect of his first second toe on the left looks [name] lot betterusing silver alginate ketoconazole The area on the first second toe web space on the right also looks [name] lot bette 01/17/18; his original wound on the left lateral leg continues to progress using Hydrofera Blue Ulcer on his left buttock also is smaller surface healthier except for [name] small probing site going into the muscle layer superiorly. 2.4 cm of tunneling in this area DTI on his left heel we have only been offloading. Looks better than last week no threatened open no evidence of infection the wound on the dorsal aspect of the first second toe on the left continues to look like it's regressing we have only been using silver alginate and terbinafine orally The area in the  first second toe web space on the right also looks to be [name] lot better using silver alginate and terbinafine I think this was prompted by tinea pedis 01/31/18; the patient was hospitalized in [place] last week apparently for [name] complicated UTI. He was discharged on cefepime he does in and out catheterizations. In the hospital he was discovered M I don't mild elevation of AST and ALT and the terbinafine was stopped.predictably the pressure ulcer on s his buttock looks betterusing silver alginate. The area on the left lateral leg also is better using Hydrofera Blue. The area between the first and second toes on the left better. First and second toes on the right still substantial but better. Finally the DTI on the left heel has held together and looks like it's resolving 02/07/18-he is here in follow-up evaluation for  multiple ulcerations. He has new injury to the lateral aspect of the last issue [name] pressure ulcer, he states this is from adhesive removal trauma. He states he has tried multiple adhesive products with no success. All other ulcers appear stable. The left heel DTI is resolving. We will continue with same treatment plan and follow-up next week. 02/14/18; follow-up for multiple areas. He has [name] new area last week on the lateral aspect of his pressure ulcer more over the posterior trochanter. The original pressure ulcer looks quite stable has healthy granulation. 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 Almost fully epithelialized on the original superior area using Hydrofera Blue DTI on the left heel has peeled off this week to reveal [name] small superficial wound under denuded skin and subcutaneous tissue Both areas between the first and second toes look better including nothing open on the left 02/21/18; The patient's wounds on his left ischial tuberosity and posterior left greater trochanter  actually looked better. He has [name] large area of irritation around the area which I think is contact dermatitis. I am doubtful that this is fungal His original wound on the left lateral calf continues to improve we have been using Hydrofera Blue There is no open area in the left first second toe web space although there is [name] lot of thick callus The DTI on the left heel required debridement today of necrotic surface eschar and subcutaneous tissue using silver alginate Finally the area on the right first second toe webspace continues to contract using silver alginate and ketoconazole 02/28/18 Left ischial tuberosity wounds look better using silver alginate. Original wound on the left calf only has one small open area left using Hydrofera Blue DTI on the left heel required debridement mostly removing skin from around this wound surface. Using silver alginate The areas on the right first/second toe web space using silver alginate and ketoconazole 03/08/18 on evaluation today patient appears to be doing decently well as best I can tell in regard to his wounds. This is the first time that I have seen him as he generally is followed by Dr. Dellia Nims. With that being said none of his wounds appear to be infected he does have an area where there is some skin covering what appears to be [name] new wound on the left dorsal surface of his great toe. This is right at the nail bed. With that being said I do believe that debrided away some of the excess skin can be of benefit in this regard. Otherwise he has been tolerating the dressing changes without complication. 03/14/18; patient arrives today with the multiplicity of wounds that we are following. He has not been systemically unwell Original wound on the left lateral calf now only has 2 small open areas we've been using Hydrofera Blue which should continue The deep tissue injury on the left heel requires debridement today. We've been using silver alginate The left first  second toe and the right first second toe are both are reminiscence what I think was tinea pedis. Apparently some of the callus Surface between the toes was removed last week when it started draining. Purulent drainage coming from the wound on the ischial tuberosity on the left. 03/21/18-He is here in follow-up evaluation for multiple wounds. 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 He tolerated debridement. The only change to last week's recommendations  is to discontinue antifungal cream between toes. He will follow-up next week 03/28/18; following up for multiple wounds;Concern this week is streaking redness and swelling in the right foot. He is going to need antibiotics for this. 03/31/18; follow-up for right foot cellulitis. Streaking redness and swelling in the right foot on 03/28/18. 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 I put him on clindamycin 300 mg every 6 and brought him in for [name] quick check. He has an open wound between his first and second toes on the right foot as [name] potential source. 04/04/18; Right foot cellulitis is resolving he is completing clindamycin. This is truly good news Left lateral calf wound which is initial wound only has one small open area inferiorly this is close to healing out. He has compression stockings. We will use Hydrofera Blue right down to the epithelialization of this Nonviable surface on the left heel which was initially pressure with [name] DTI. We've been using Hydrofera Blue. I'm going to switch this back to silver alginate Left first second toe/tinea pedis this looks better using silver alginate Right first second toe tinea pedis using silver alginate Large pressure ulcers on theLeft ischial tuberosity. Small wound here Looks better. I am uncertain about the surface over the large wound. Using silver alginate 04/11/18; Cellulitis in the right foot is  resolved Left lateral calf wound which was his original wounds still has 2 tiny open areas remaining this is just about closed Nonviable surface on the left heel is better but still requires debridement Left first second toe/tinea pedis still open using silver alginate Right first second toe wound tinea pedis I asked him to go back to using ketoconazole and silver alginate Large pressure ulcers on the left ischial tuberosity this shear injury here is resolved. Wound is smaller. No evidence of infection using silver alginate 04/18/18; Patient arrives with an intense area of cellulitis in the right mid lower calf extending into the right heel area. Bright red and warm. Smaller area on the left anterior leg. He has [name] significant history of MRSA. He will definitely need antibioticsdoxycycline He now has 2 open areas on the left ischial tuberosity the original large wound and now [name] satellite area which I think was above his initial satellite areas. Not [name] wonderful surface on this satellite area surrounding erythema which looks like pressure related. His left lateral calf wound again his original wound is just about closed Left heel pressure injury still requiring debridement Left first second toe looks [name] lot better using silver alginate Right first second toe also using silver alginate and ketoconazole cream also looks better 04/20/18; the patient was worked in early today out of concerns with his cellulitis on the right leg. I had started him on doxycycline. This was 2 days ago. His wife was concerned about the swelling in the area. Also concerned about the left buttock. He has not been systemically unwell no fever chills. No nausea vomiting or diarrhea 04/25/18; the patient's left buttock wound is continued to deteriorate he is using Hydrofera Blue. He is still completing clindamycin for the cellulitis on the right leg although all of this looks better. 05/02/18 Left buttock wound still with [name] lot of  drainage and [name] very tightly adherent fibrinous necrotic surface. He has [name] deeper area superiorly The left lateral calf wound is still closed DTI wound on the left heel necrotic surface especially the circumference using Iodoflex Areas between his left first second toe and right first second  toe both look better. Dorsally and the right first second toe he had [name] necrotic surface although at smaller. In using silver alginate and ketoconazole. I did [name] culture last week which was [name] deep tissue culture of the reminiscence of the open wound on the right first second toe dorsally. This grew [name] few Acinetobacter and [name] few methicillin-resistant staph aureus. Nevertheless the area actually this week looked better. I didn't feel the need to specifically address this at least in terms of systemic antibiotics. 05/09/18; wounds are measuring larger more drainage per our intake. We are using Santyl covered with alginate on the large superficial buttock wounds, Iodosorb on the left heel, ketoconazole and silver alginate to the dorsal first and second toes bilaterally. 05/16/18; The area on his left buttock better in some aspects although the area superiorly over the ischial tuberosity required an extensive debridement.using Santyl Left heel appears stable. Using Iodoflex The areas between his first and second toes are not bad however there is spreading erythema up the dorsal aspect of his left foot this looks like cellulitis again. He is insensate the erythema is really very brilliant.o Erysipelas He went to see an allergist days ago because he was itching part of this he had lab work done. This showed [name] white count of 15.1 with 70% neutrophils. Hemoglobin of 11.4 and [name] platelet count of 659,000. Last white count we had in [facility] was [name] 2-1/2 years ago which was 25.9 but he was ill at the time. He was able to show me some lab work that was done by his primary physician the pattern is about the same. 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 But prompted me to go ahead and do x-rays of both feet and the pelvis rule out osteomyelitis. He also had [name] comprehensive metabolic panel this was reasonably normal his albumin was 3.7 liver function tests BUN/creatinine all normal 05/23/18; x-rays of both his feet from last week were negative for underlying pulmonary abnormality. The x-ray of his pelvis however showed mild irregularity in the left ischial which may represent some early osteomyelitis. The wound in the left ischial continues to get deeper clearly now exposed muscle. Each week necrotic surface material over this area. Whereas the rest of the wounds do not look so bad. The left ischial wound we have been using Santyl and calcium alginate T the left heel surface necrotic debris using Iodoflex o The left lateral leg is still healed Areas on the left dorsal foot and the right dorsal foot are about the same. There is some inflammation on the left which might represent contact dermatitis, fungal dermatitis I am doubtful cellulitis although this looks better than last week 05/30/18; CT scan done at Hospital did not show any osteomyelitis or abscess. Suggested the possibility of underlying cellulitis although I don't see [name] lot of evidence of this at the bedside The wound itself on the left buttock/upper thigh actually looks somewhat better. No debridement Left heel also looks better no debridement continue Iodoflex Both dorsal first second toe spaces appear better using Lotrisone. Left still required debridement 06/06/18; Intake reported some purulent looking drainage from the left gluteal wound. Using Santyl and calcium alginate Left heel looks better although still [name] nonviable surface requiring debridement The left dorsal foot first/second webspace actually expanding and somewhat deeper. I may consider doing [name] shave biopsy of this area Right dorsal foot first/second  webspace appears stable to improved. Using Lotrisone  and silver alginate to both these areas 06/13/18 Left gluteal surface looks better. Now separated in the 2 wounds. No debridement required. Still drainage. We'll continue silver alginate Left heel continues to look better with Iodoflex continue this for at least another week Of his dorsal foot wounds the area on the left still has some depth although it looks better than last week. We've been using Lotrisone and silver alginate 06/20/18 Left gluteal continues to look better healthy tissue Left heel continues to look better healthy granulation wound is smaller. He is using Iodoflex and his long as this continues continue the Iodoflex Dorsal right foot looks better unfortunately dorsal left foot does not. There is swelling and erythema of his forefoot. He had minor trauma to this several days ago but doesn't think this was enough to have caused any tissue injury. Foot looks like cellulitis, we have had this problem before 06/27/18 on evaluation today patient appears to be doing [name] little worse in regard to his foot ulcer. 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 Both of which would really be his only options for treating this infection. In the past he has been given and effusion of Orbactiv. This is done very well for him in the past again it's one time dosing IV antibiotic therapy. Subsequently I do believe this is something we're gonna need to see about doing at this point in time. Currently his other wounds seem to be doing somewhat better in my pinion I'm pretty happy in that regard. 07/03/18 on evaluation today patient's wounds actually appear to be doing fairly well. He has been tolerating the dressing changes without complication. All in all he seems to be showing signs of improvement. In regard to the  antibiotics he has been dealing with infectious disease since I saw him last week as far as getting this scheduled. In the end he's going [facility] this coming Friday. In the meantime he has been continuing to perform the dressing changes in such as previous. There does not appear to be any evidence of infection worsengin at this time. 07/10/18; Since I last saw this man 2 weeks ago things have actually improved. IV antibiotics of resulted in less forefoot erythema although there is still some present. He is not systemically unwell Left buttock wounds 2 now have no depth there is increased epithelialization Using silver alginate Left heel still requires debridement using Iodoflex Left dorsal foot still with [name] sizable wound about the size of [name] border but healthy granulation Right dorsal foot still with [name] slitlike area using silver alginate 07/18/18; the patient's cellulitis in the left foot is improved in fact I think it is on its way to resolving. Left buttock wounds 2 both look better although the larger one has hypertension granulation we've been using silver alginate Left heel has some thick circumferential redundant skin over the wound edge which will need to be removed today we've been using Iodoflex Left dorsal foot is still [name] sizable wound required debridement using silver alginate The right dorsal foot is just about closed only [name] small open area remains here 07/25/18; left foot cellulitis is resolved Left buttock wounds 2 both look better. Hyper-granulation on the major area Left heel as some debris over the surface but otherwise looks [name] healthier wound. Using silver  collagen Right dorsal foot is just about closed 07/31/18; arrives with our intake nurse worried about purulent drainage from the buttock. We had hyper-granulation here last week His buttock wounds 2 continue to look better Left heel some debris over the surface but measuring  smaller. Right dorsal foot unfortunately has openings between the toes Left foot superficial wound looks less aggravated. 08/07/18 Buttock wounds continue to look better although some of her granulation and the larger medial wound. silver alginate Left heel continues to look [name] lot better.silver collagen Left foot superficial wound looks less stable. Requires debridement. He has [name] new wound superficial area on the foot on the lateral dorsal foot. Right foot looks better using silver alginate without Lotrisone 08/14/2018; patient was in the ER last week diagnosed with [name] UTI. He is now on Cefpodoxime and Macrodantin. Buttock wounds continued to be smaller. Using silver alginate Left heel continues to look better using silver collagen Left foot superficial wound looks as though it is improving Right dorsal foot area is just about healed. 08/21/2018; patient is completed his antibiotics for his UTI. He has 2 open areas on the buttocks. There is still not closed although the surface looks satisfactory. Using silver alginate Left heel continues to improve using silver collagen The bilateral dorsal foot areas which are at the base of his first and second toes/possible tinea pedis are actually stable on the left but worse on the right. The area on the left required debridement of necrotic surface. After debridement I obtained [name] specimen for PCR culture. The right dorsal foot which is been just about healed last week is now reopened 08/28/2018; culture done on the left dorsal foot showed coag negative staph both staph epidermidis and Lugdunensis. I think this is worthwhile initiating systemic treatment. I will use doxycycline given his long list of allergies. The area on the left heel slightly improved but still requiring debridement. The large wound on the buttock is just about closed whereas the smaller one is larger. Using silver alginate in this area 09/04/2018; patient is completing his doxycycline for  the left foot although this continues to be [name] very difficult wound area with very adherent necrotic debris. We are using silver alginate to all his wounds right foot left foot and the small wounds on his buttock, silver collagen on the left heel. 09/11/2018; once again this patient has intense erythema and swelling of the left forefoot. Lesser degrees of erythema in the right foot. He has [name] long list of allergies and intolerances. I will reinstitute doxycycline. 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 Using silver alginate Left heel also looks better using silver collagen Unfortunately both the areas on his feet look worse. The area on the left first second webspace is now gone through to the plantar part of his foot. The area on the left foot anteriorly is irritated with erythema and swelling in the forefoot. 09/25/2018 His wound on the left plantar heel looks better. Using silver collagen The area on the left buttock 2 small remnant areas. One is closed one is still open. Using silver alginate The areas between both his first and second toes look worse. This in spite of long-standing antifungal therapy with ketoconazole and silver alginate which should have antifungal activity He has small areas around his original wound on the left calf one is on the bottom of the original scar tissue and one superiorly both of these are small and  superficial but again given wound history in this site this is worrisome 10/02/2018 Left plantar heel continues to gradually contract using silver collagen Left buttock wound is unchanged using silver alginate The areas on his dorsal feet between his first and second toes bilaterally look about the same. I prescribed clindamycin ointment to see if we can address chronic staph colonization and also the underlying possibility of erythrasma The left lateral lower extremity wound is actually on the lateral part of his  ankle. Small open area here. We have been using silver alginate 10/09/2018; Left plantar heel continues to look healthy and contract. No debridement is required Left buttock slightly smaller with [name] tape injury wound just below which was new this week Dorsal feet somewhat improved I have been using clindamycin Left lateral looks lower extremity the actual open area looks worse although [name] lot of this is epithelialized. I am going to change to silver collagen today He has [name] lot more swelling in the right leg although this is not pitting not red and not particularly warm there is [name] lot of spasm in the right leg usually indicative of people with paralysis of some underlying discomfort. We have reviewed his vascular status from 2017 he had [name] left greater saphenous vein ablation. I wonder about referring him back to vascular surgery if the area on the left leg continues to deteriorate. 10/16/2018 in today for follow-up and management of multiple lower extremity ulcers. His left Buttock wound is much lower smaller and almost closed completely. The wound to the left ankle has began to reopen with Epithelialization and some adherent slough. He has multiple new areas to the left foot and leg. The left dorsal foot without much improvement. Wound present between left great webspace and 2nd toe. Erythema and edema present right leg. Right LE ultrasound obtained on 10/10/18 was negative for DVT . 10/23/2018; Left buttock is closed over. Still dry macerated skin but there is no open wound. I suspect this is chronic pressure/moisture Left lateral calf is quite [name] bit worse than when I saw this last. There is clearly drainage here he has macerated skin into the left plantar heel. We will change the primary dressing to alginate Left dorsal foot has some improvement in overall wound area. Still using clindamycin and silver alginate Right dorsal foot about the same as the left using clindamycin and silver alginate The  erythema in the right leg has resolved. He is DVT rule out was negative Left heel pressure area required debridement although the wound is smaller and the surface is health 10/26/2018 The patient came back in for his nurse check today predominantly because of the drainage coming out of the left lateral leg with [name] recent reopening of his original wound on the left lateral calf. He comes in today with [name] large amount of surrounding erythema around the wound extending from the calf into the ankle and even in the area on the dorsal foot. He is not systemically unwell. He is not febrile. Nevertheless this looks like cellulitis. We have been using silver alginate to the area. I changed him to [name] regular visit and I am going to prescribe him doxycycline. The rationale here is [name] long list of medication intolerances and [name] history of MRSA. I did not see anything that I thought would provide [name] valuable culture 10/30/2018 Follow-up from his appointment 4 days ago with really an extensive area of cellulitis in the left calf left lateral ankle and left dorsal foot. I put  him on doxycycline. He has [name] long list of medication allergies which are true allergy reactions. 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 In any case he is [name] lot better today. The erythema especially in the anterior and lateral left calf is better. He still has left ankle erythema. He also is complaining about increasing edema in the right leg we have only been using Kerlix Coban and he has been doing the wraps at home. Finally he has [name] spotty rash on the medial part of his upper left calf which looks like folliculitis or perhaps wrap occlusion type injury. Small superficial macules not pustules 11/06/18 patient arrives today with again [name] considerable degree of erythema around the wound on the left lateral calf extending into the dorsal ankle and dorsal foot. This is [name] lot worse  than when I saw this last week. He is on doxycycline really with not [name] lot of improvement. He has not been systemically unwell Wounds on the; left heel actually looks improved. Original area on the left foot and proximity to the first and second toes looks about the same. He has superficial areas on the dorsal foot, anterior calf and then the reopening of his original wound on the left lateral calf which looks about the same The only area he has on the right is the dorsal webspace first and second which is smaller. He has [name] large area of dry erythematous skin on the left buttock small open area here. 11/13/2018; the patient arrives in much better condition. The erythema around the wound on the left lateral calf is [name] lot better. Not sure whether this was the clindamycin or the TCA and ketoconazole or just in the improvement in edema control [stasis dermatitis]. In any case this is [name] lot better. The area on the left heel is very small and just about resolved using silver collagen we have been using silver alginate to the areas on his dorsal feet 11/20/2018; his wounds include the left lateral calf, left heel, dorsal aspects of both feet just proximal to the first second webspace. He is stable to slightly improved. I did not think any changes to his dressings were going to be necessary 11/27/2018 he has [name] reopening on the left buttock which is surrounded by what looks like tinea or perhaps some other form of dermatitis. The area on the left dorsal foot has some erythema around it I have marked this area but I am not sure whether this is cellulitis or not. Left heel is not closed. Left calf the reopening is really slightly longer and probably worse 1/13; in general things look better and smaller except for the left dorsal foot. Area on the left heel is just about closed, left buttock looks better only [name] small wound remains in the skin looks better [using Lotrisone] 1/20; the area on the left heel only has [name]  few remaining open areas here. Left lateral calf about the same in terms of size, left dorsal foot slightly larger right lateral foot still not closed. The area on the left buttock has no open wound and the surrounding skin looks [name] lot better 1/27; the area on the left heel is closed. Left lateral calf better but still requiring extensive debridements. The area on his left buttock is closed. He still has the open areas on the left dorsal foot which is slightly smaller in the right foot which is slightly expanded. We have  been using Iodoflex on these areas as well 2/3; left heel is closed. Left lateral calf still requiring debridement using Iodoflex there is no open area on his left buttock however he has dry scaly skin over [name] large area of this. Not really responding well to the Lotrisone. Finally the areas on his dorsal feet at the level of the first second webspace are slightly smaller on the right and about the same on the left. Both of these vigorously debrided with Anasept and gauze 2/10; left heel remains closed he has dry erythematous skin over the left buttock but there is no open wound here. Left lateral leg has come in and with. Still requiring debridement we have been using Iodoflex here. Finally the area on the left dorsal foot and right dorsal foot are really about the same extremely dry callused fissured areas. He does not yet have [name] dermatology appointment 2/17; left heel remains closed. He has [name] new open area on the left buttock. The area on the left lateral calf is bigger longer and still covered in necrotic debris. No major change in his foot areas bilaterally. I am awaiting for [name] dermatologist to look on this. We have been using ketoconazole I do not know that this is been doing any good at all. 2/24; left heel remains closed. The left buttock wound that was new reopening last week looks better. The left lateral calf appears better also although still requires debridement. The major  area on his foot is the left first second also requiring debridement. We have been putting Prisma on all wounds. I do not believe that the ketoconazole has done too much good for his feet. He will use Lotrisone I am going to give him [name] 2-week course of terbinafine. We still do not have [name] dermatology appointment 3/2 left heel remains closed however there is skin over bone in this area I pointed this out to him today. The left buttock wound is epithelialized but still does not look completely stable. The area on the left leg required debridement were using silver collagen here. With regards to his feet we changed to Lotrisone last week and silver alginate. 3/9; left heel remains closed. Left buttock remains closed. The area on the right foot is essentially closed. The left foot remains unchanged. Slightly smaller on the left lateral calf. Using silver collagen to both of these areas 3/16-Left heel remains closed. Area on right foot is closed. Left lateral calf above the lateral malleolus open wound requiring debridement with easy bleeding. Left dorsal wound proximal to first toe also debrided. Left ischial area open new. Patient has been using Prisma with wrapping every 3 days. Dermatology appointment is apparently tomorrow.Patient has completed his terbinafine 2-week course with some apparent improvement according to him, there is still flaking and dry skin in his foot on the left 3/23; area on the right foot is reopened. The area on the left anterior foot is about the same still [name] very necrotic adherent surface. He still has the area on the left leg and reopening is on the left buttock. He apparently saw dermatology although I do not have [name] note. According to the patient who is usually fairly well informed they did not have any good ideas. Put him on oral terbinafine which she is been on before. 3/30; using silver collagen to all wounds. Apparently his dermatologist put him on doxycycline and  rifampin presumably some culture grew staph. I do not have this result. He remains on terbinafine  although I have used terbinafine on him before 4/6; patient has had [name] fairly substantial reopening on the right foot between the first and second toes. He is finished his terbinafine and I believe is on doxycycline and rifampin still as prescribed by dermatology. We have been using silver collagen to all his wounds although the patient reports that he thinks silver alginate does better on the wounds on his buttock. 4/13; the area on his left lateral calf about the same size but it did not require debridement. Left dorsal foot just proximal to the webspace between the first and second toes is about the same. Still nonviable surface. I note some superficial bronze discoloration of the dorsal part of his foot Right dorsal foot just proximal to the first and second toes also looks about the same. I still think there may be the same discoloration I noted above on the left Left buttock wound looks about the same 4/20; left lateral calf appears to be gradually contracting using silver collagen. 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 The left dorsal foot wound is debrided of tightly adherent necrotic debris and really cleans up quite nicely. The right area is worse with expansion. I did not debride this it is now over the base of the second toe The area on his left buttock is smaller no debridement is required using silver collagen 5/4; left calf continues to make good progress. He arrives with erythema around the wounds on his dorsal foot which even extends to the plantar aspect. Very concerning for coexistent infection. He is finished the erythromycin I gave him for possible erythrasma this does not seem to have helped. The area on the left foot is about the same base of the dorsal toes Is area on the buttock looks improved on the left 5/11; left  calf and left buttock continued to make good progress. Left foot is about the same to slightly improved. Major problem is on the right foot. He has not had an x-ray. Deep tissue culture I did last week showed both Enterobacter and E. coli. I did not change the doxycycline I put him on empirically although neither 1 of these were plated to doxycycline. He arrives today with the erythema looking worse on both the dorsal and plantar foot. Macerated skin on the bottom of the foot. he has not been systemically unwell 5/18-Patient returns at 1 week, left calf wound appears to be making some progress, left buttock wound appears slightly worse than last time, left foot wound looks slightly better, right foot redness is marginally better. X-ray of both feet show no air or evidence of osteomyelitis. Patient is finished his Omnicef and terbinafine. He continues to have macerated skin on the bottom of the left foot as well as right 5/26; left calf wound is better, left buttock wound appears to have multiple small superficial open areas with surrounding macerated skin. X-rays that I did last time showed no evidence of osteomyelitis in either foot. He is finished cefdinir and doxycycline. I do not think that he was on terbinafine. He continues to have [name] large superficial open area on the right foot anterior dorsal and slightly between the first and second toes. I did send him to dermatology 2 months ago or so wondering about whether they would do [name] fungal scraping. I do not believe they did but did do [name] culture. We have been using silver alginate to the toe areas, he has been using antifungals  at home topically either ketoconazole or Lotrisone. We are using silver collagen on the left foot, silver alginate on the right, silver collagen on the left lateral leg and silver alginate on the left buttock 6/1; left buttock area is healed. We have the left dorsal foot, left lateral leg and right dorsal foot. We are using  silver alginate to the areas on both feet and silver collagen to the area on his left lateral calf 6/8; the left buttock apparently reopened late last week. He is not really sure how this happened. He is tolerating the terbinafine. Using silver alginate to all wounds 6/15; left buttock wound is larger than last week but still superficial. Came in the clinic today with [name] report of purulence from the left lateral leg I did not identify any infection Both areas on his dorsal feet appear to be better. He is tolerating the terbinafine. Using silver alginate to all wounds 6/22; left buttock is about the same this week, left calf quite [name] bit better. His left foot is about the same however he comes in with erythema and warmth in the right forefoot once again. Culture that I gave him in the beginning of May showed Enterobacter and E. coli. I gave him doxycycline and things seem to improve although neither 1 of these organisms was specifically plated. 6/29; left buttock is larger and dry this week. Left lateral calf looks to me to be improved. Left dorsal foot also somewhat improved right foot completely unchanged. The erythema on the right foot is still present. He is completing the Ceftin dinner that I gave him empirically [see discussion above.) 7/6 - All wounds look to be stable and perhaps improved, the left buttock wound is slightly smaller, per patient bleeds easily, completed ceftin, the right foot redness is less, he is on terbinafine 7/13; left buttock wound about the same perhaps slightly narrower. Area on the left lateral leg continues to narrow. Left dorsal foot slightly smaller right foot about the same. We are using silver alginate on the right foot and Hydrofera Blue to the areas on the left. Unna boot on the left 2 layer compression on the right 7/20; left buttock wound absolutely the same. Area on lateral leg continues to get better. Left dorsal foot require debridement as did the right no  major change in the 7/27; left buttock wound the same size necrotic debris over the surface. The area on the lateral leg is closed once again. His left foot looks better right foot about the same although there is some involvement now of the posterior first second toe area. He is still on terbinafine which I have given him for [name] month, not certain [name] centimeter major change 06/25/19-All wounds appear to be slightly improved according to report, left buttock wound looks clean, both foot wounds have minimal to no debris the right dorsal foot has minimal slough. We are using Hydrofera Blue to the left and silver alginate to the right foot and ischial wound. 8/10-Wounds all appear to be around the same, the right forefoot distal part has some redness which was not there before, however the wound looks clean and small. Ischial wound looks about the same with no changes 8/17; his wound on the left lateral calf which was his original chronic venous insufficiency wound remains closed. Since I last saw him the areas on the left dorsal foot right dorsal foot generally appear better but require debridement. The area on his left initial tuberosity appears somewhat larger to  me perhaps hyper granulated and bleeds very easily. We have been using Hydrofera Blue to the left dorsal foot and silver alginate to everything else 8/24; left lateral calf remains closed. The areas on his dorsal feet on the webspace of the first and second toes bilaterally both look better. The area on the left buttock which is the pressure ulcer stage II slightly smaller. I change the dressing to Hydrofera Blue to all areas 8/31; left lateral calf remains closed. The area on his dorsal feet bilaterally look better. Using Hydrofera Blue. Still requiring debridement on the left foot. No change in the left buttock pressure ulcers however 9/14; left lateral calf remains closed. Dorsal feet look quite [name] bit better than 2 weeks ago. Flaking dry skin  also [name] lot better with the ammonium lactate I gave him 2 weeks ago. The area on the left buttock is improved. He states that his Roho cushion developed [name] leak and he is getting [name] new one, in the interim he is offloading this vigorously 9/21; left calf remains closed. Left heel which was [name] possible DTI looks better this week. He had macerated tissue around the left dorsal foot right foot looks satisfactory and improved left buttock wound. I changed his dressings to his feet to silver alginate bilaterally. Continuing Hydrofera Blue on the left buttock. 9/28 left calf remains closed. Left heel did not develop anything [possible DTI] dry flaking skin on the left dorsal foot. Right foot looks satisfactory. Improved left buttock wound. We are using silver alginate on his feet Hydrofera Blue on the buttock. I have asked him to go back to the Lotrisone on his feet including the wounds and surrounding areas 10/5; left calf remains closed. The areas on the left and right feet about the same. [name] lot of this is epithelialized however debris over the remaining open areas. He is using Lotrisone and silver alginate. The area on the left buttock using Hydrofera Blue 10/26. Patient has been out for 3 weeks secondary to Covid concerns. He tested negative but I think his wife tested positive. He comes in today with the left foot substantially worse, right foot about the same. Even more concerning he states that the area on his left buttock closed over but then reopened and is considerably deeper in one aspect than it was before [stage III wound] 11/2; left foot really about the same as last week. Quarter sized wound on the dorsal foot just proximal to the first second toes. Surrounding erythema with areas of denuded epithelium. This is not really much different looking. Did not look like cellulitis this time however. Right foot area about the same.. We have been using silver alginate alginate on his toes Left buttock  still substantial irritated skin around the wound which I think looks somewhat better. We have been using Hydrofera Blue here. 11/9; left foot larger than last week and [name] very necrotic surface. Right foot I think is about the same perhaps slightly smaller. Debris around the circumference also addressed. Unfortunately on the left buttock there is been [name] decline. Satellite lesions below the major wound distally and now [name] an additional one posteriorly we have been using Hydrofera Blue but I think this is [name] pressure issue 11/16; left foot ulcer dorsally again [name] very adherent necrotic surface. Right foot is about the same. Not much change in the pressure ulcer on his left buttock. 11/30; left foot ulcer dorsally basically the same as when I saw him 2 weeks ago. Very adherent  fibrinous debris on the wound surface. Patient reports [name] lot of drainage as well. 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 We have been using Iodoflex, patient changed back to alginate because of the drainage. Area on his right dorsal foot really looks benign with [name] healthier surface certainly [name] lot better than on the left. Left buttock wounds all improved using Hydrofera Blue 12/7; left dorsal foot again no improvement. Tightly adherent debris. PCR culture I did last week only showed likely skin contaminant. I have gone ahead and done [name] punch biopsy of this which is about the last thing in terms of investigations I can think to do. He has known venous insufficiency and venous hypertension and this could be the issue here. The area on the right foot is about the same left buttock slightly worse according to our intake nurse secondary to [facility] Blue sticking to the wound 12/14; biopsy of the left foot that I did last time showed changes that could be related to wound healing/chronic stasis dermatitis phenomenon no neoplasm. We have been using silver alginate to both feet. I change the  one on the left today to Sorbact and silver alginate to his other 2 wounds 12/28; the patient arrives with the following problems; Major issue is the dorsal left foot which continues to be [name] larger deeper wound area. Still with [name] completely nonviable surface Paradoxically the area mirror image on the right on the right dorsal foot appears to be getting better. He had some loss of dry denuded skin from the lower part of his original wound on the left lateral calf. Some of this area looked [name] little vulnerable and for this reason we put him in wrap that on this side this week The area on his left buttock is larger. He still has the erythematous circular area which I think is [name] combination of pressure, sweat. This does not look like cellulitis or fungal dermatitis 11/26/2019; -Dorsal left foot large open wound with depth. Still debris over the surface. Using Sorbact The area on the dorsal right foot paradoxically has closed over He has [name] reopening on the left ankle laterally at the base of his original wound that extended up into the calf. This appears clean. The left buttock wound is smaller but with very adherent necrotic debris over the surface. We have been using silver alginate here as well The patient had arterial studies done in 2017. He had biphasic waveforms at the dorsalis pedis and posterior tibial bilaterally. ABI in the left was 1.17. Digit waveforms were dampened. He has slight spasticity in the great toes I do not think [name] TBI would be possible 1/11; the patient comes in today with [name] sizable reopening between the first and second toes on the right. This is not exactly in the same location where we have been treating wounds previously. According to our intake nurse this was actually fairly deep but 0.6 cm. The area on the left dorsal foot looks about the same the surface is somewhat cleaner using Sorbact, his MRI is in 2 days. We have not managed yet to get arterial studies. The new  reopening on the left lateral calf looks somewhat better using alginate. The left buttock wound is about the same using alginate 1/18; the patient had his ARTERIAL studies which were quite normal. ABI in the right at 1.13 with triphasic/biphasic waveforms on the left ABI 1.06 again with triphasic/biphasic waveforms. It would not have been possible  to have done [name] toe brachial index because of spasticity. We have been using Sorbac to the left foot alginate to the rest of his wounds on the right foot left lateral calf and left buttock 1/25; arrives in clinic with erythema and swelling of the left forefoot worse over the first MTP area. This extends laterally dorsally and but also posteriorly. Still has an area on the left lateral part of the lower part of his calf wound it is eschared and clearly not closed. Area on the left buttock still with surrounding irritation and erythema. Right foot surface wound dorsally. The area between the right and first and second toes appears better. 2/1; The left foot wound is about the same. Erythema slightly better I gave him [name] week of doxycycline empirically Right foot wound is more extensive extending between the toes to the plantar surface Left lateral calf really no open surface on the inferior part of his original wound however the entire area still looks vulnerable Absolutely no improvement in the left buttock wound required debridement. 2/8; the left foot is about the same. Erythema is slightly improved I gave him clindamycin last week. Right foot looks better he is using Lotrimin and silver alginate He has [name] breakdown in the left lateral calf. Denuded epithelium which I have removed Left buttock about the same were using Hydrofera Blue 2/15; left foot is about the same there is less surrounding erythema. Surface still has tightly adherent debris which I have debriding however not making any progress Right foot has [name] substantial wound on the medial right  second toe between the first and second webspace. Still an open area on the left lateral calf distal area. Buttock wound is about the same 2/22; left foot is about the same less surrounding erythema. Surface has adherent debris. Polymen Ag Right foot area significant wound between the first and second toes. We have been using silver alginate here Left lateral leg polymen Ag at the base of his original venous insufficiency wound Left buttock some improvement here 3/1; Right foot is deteriorating in the first second toe webspace. Larger and more substantial. We have been using silver alginate. Left dorsal foot about the same markedly adherent surface debris using PolyMem Ag Left lateral calf surface debris using PolyMem AG Left buttock is improved again using PolyMem Ag. He is completing his terbinafine. The erythema in the foot seems better. He has been on this for 2 weeks 3/8; no improvement in any wound area in fact he has [name] small open area on the dorsal midfoot which is new this week. He has not gotten his foot x-rays yet 3/15; his x-rays were both negative for osteomyelitis of both feet. No major change in any of his wounds on the extremities however his buttock wounds are better. We have been using polymen on the buttocks, left lower leg. Iodoflex on the left foot and silver alginate on the right 3/22; arrives in clinic today with the 2 major issues are the improvement in the left dorsal foot wound which for once actually looks healthy with [name] nice healthy wound surface without debridement. Using Iodoflex here. Unfortunately on the left lateral calf which is in the distal part of his original wound he came to the clinic here for there was purulent drainage noted some increased breakdown scattered around the original area and [name] small area proximally. We we are using polymen here will change to silver alginate today. His buttock wound on the left is better and I  think the area on the right first  second toe webspace is also improved 3/29; left dorsal foot looks better. Using Iodoflex. Left ankle culture from deterioration last time grew E. coli, Enterobacter and Enterococcus. I will give him [name] course of cefdinir although that will not cover Enterococcus. The area on the right foot in the webspace of the first and second toe lateral first toe looks better. The area on his buttock is about healed Vascular appointment is on April 21. This is to look at his venous system vis--vis continued breakdown of the wounds on the left including the left lateral leg and left dorsal foot he. He has had previous ablations on this side 4/5; the area between the right first and second toes lateral aspect of the first toe looks better. Dorsal aspect of the left first toe on the left foot also improved. Unfortunately the left lateral lower leg is larger and there is [name] second satellite wound superiorly. The usual superficial abrasions on the left buttock overall better but certainly not closed 4/12; the area between the right first and second toes is improved. Dorsal aspect of the left foot also slightly smaller with [name] vibrant healthy looking surface. No real change in the left lateral leg and the left buttock wound is healed He has an unaffordable co-pay for Apligraf. Appointment with vein and vascular with regards to the left leg venous part of the circulation is on 4/21 4/19; we continue to see improvement in all wound areas. Although this is minor. He has his vascular appointment on 4/21. The area on the left buttock has not reopened although right in the center of this area the skin looks somewhat threatened 4/26; the left buttock is unfortunately reopened. In general his left dorsal foot has [name] healthy surface and looks somewhat smaller although it was not measured as such. The area between his first and second toe webspace on the right as [name] small wound against the first toe. The patient saw vascular  surgery. The real question I was asking was about the small saphenous vein on the left. He has previously ablated left greater saphenous vein. Nothing further was commented on on the left. Right greater saphenous vein without reflux at the saphenofemoral junction or proximal thigh there was no indication for ablation of the right greater saphenous vein duplex was negative for DVT bilaterally. They did not think there was anything from [name] vascular surgery point of view that could be offered. They ABIs within normal limits 5/3; only small open area on the left buttock. The area on the left lateral leg which was his original venous reflux is now 2 wounds both which look clean. We are using Iodoflex on the left dorsal foot which looks healthy and smaller. He is down to [name] very tiny area between the right first and second toes, using silver alginate 5/10; all of his wounds appear better. We have much better edema control in 4 layer compression on the left. This may be the factor that is allowing the left foot and left lateral calf to heal. He has external compression garments at home 04/14/20-All of his wounds are progressing well, the left forefoot is practically closed, left ischium appears to be about the same, right toe webspace is also smaller. The left lateral leg is about the same, continue using Hydrofera Blue to this, silver alginate to the ischium, Iodoflex to the toe space on the right 6/7; most of his wounds outside of the left buttock are  doing well. The area on the left lateral calf and left dorsal foot are smaller. The area on the right foot in between the first and second toe webspace is barely visible although he still says there is some drainage here is the only reason I did not heal this out. Unfortunately the area on the left buttock almost looks like he has [name] skin tear from tape. He has open wound and then [name] large flap of skin that we are trying to get adherence over an area just next to  the remaining wound 6/21; 2 week follow-up. I believe is been here for nurse visits. Miraculously the area between his first and second toes on the left dorsal foot is closed over. Still open on the right first second web space. The left lateral calf has 2 open areas. Distally this is more superficial. The proximal area had [name] little more depth and required debridement of adherent necrotic material. His buttock wound is actually larger we have been using silver alginate here 6/28; the patient's area on the left foot remains closed. Still open wet area between the first and second toes on the right and also extending into the plantar aspect. We have been using silver alginate in this location. He has 2 areas on the left lower leg part of his original long wounds which I think are better. We have been using Hydrofera Blue here. Hydrofera Blue to the left buttock which is stable 7/12; left foot remains closed. Left ankle is closed. May be [name] small area between his right first and second toes the only truly open area is on the left buttock. We have been using Hydrofera Blue here 7/19; patient arrives with marked deterioration especially in the left foot and ankle. We did not put him in [name] compression wrap on the left last week in fact he wore his juxta lite stockings on either side although he does not have an underlying stocking. He has [name] reopening on the left dorsal foot, left lateral ankle and [name] new area on the right dorsal ankle. More worrisome is the degree of erythema on the left foot extending on the lateral foot into the lateral lower leg on the left 7/26; the patient had erythema and drainage from the lateral left ankle last week. Culture of this grew MRSA resistant to doxycycline and clindamycin which are the 2 antibiotics we usually use with this patient who has multiple antibiotic allergies including linezolid, trimethoprim sulfamethoxazole. I had give him an empiric doxycycline and he comes in  the area certainly looks somewhat better although it is blotchy in his lower leg. He has not been systemically unwell. He has had areas on the left dorsal foot which is [name] reopening, chronic wounds on the left lateral ankle. Both of these I think are secondary to chronic venous insufficiency. The area between his first and second toes is closed as far as I can tell. He had [name] new wrap injury on the right dorsal ankle last week. Finally he has an area on the left buttock. We have been using silver alginate to everything except the left buttock we are using Hydrofera Blue 06/30/20-Patient returns at 1 week, has been given [name] sample dose pack of NUZYRA which is [name] tetracycline derivative [omadacycline], patient has completed those, we have been using silver alginate to almost all the wounds except the left ischium where we are using Hydrofera Blue all of them look better 8/16; since I last saw the patient he has  been doing well. The area on the left buttock, left lateral ankle and left foot are all closed today. He has completed the [place] I gave him last time and tolerated this well. He still has open areas on the right dorsal ankle and in the right first second toe area which we are using silver alginate. 8/23; we put him in his bilateral external compression stockings last week as he did not have anything open on either leg except for concerning area between the right first and second toe. He comes in today with an area on the left dorsal foot slightly more proximal than the original wound, the left lateral foot but this is actually [name] continuation of the area he had on the left lateral ankle from last time. As well he is opened up on the left buttock again. 8/30; comes in today with things looking [name] lot better. The area on the left lower ankle has closed down as has the left foot but with eschar in both areas. The area on the dorsal right ankle is also epithelialized. Very little remaining of the left buttock  wound. We have been using silver alginate on all wound areas 9/13; the area in the first second toe webspace on the right has fully epithelialized. He still has some vulnerable epithelium on the right and the ankle and the dorsal foot. He notes weeping. He is using his juxta lite stocking. On the left again the left dorsal foot is closed left lateral ankle is closed. We went to the juxta lite stocking here as well. Still vulnerable in the left buttock although only 2 small open areas remain here 9/27; 2-week follow-up. We did not look at his left leg but the patient says everything is closed. He is [name] bit disturbed by the amount of edema in his left foot he is using juxta lite stockings but asking about over the toes stockings which would be 30/40, will talk to him next time. According to him there is no open wound on either the left foot or the left ankle/calf He has an open area on the dorsal right calf which I initially point [name] wrap injury. He has superficial remaining wound on the left ischial tuberosity been using silver alginate although he says this sticks to the wound 10/5; we gave him 2-week follow-up but he called yesterday expressing some concerns about his right foot right ankle and the left buttock. He came in early. There is still no open areas on the left leg and that still in his juxta lite stocking 10/11; he only has 1 small area on the left buttock that remains measuring millimeters 1 mm. Still has the same irritated skin in this area. We recommended zinc oxide when this eventually closes and pressure relief is meticulously is he can do this. He still has an area on the dorsal part of his right first through third toes which is [name] bit irritated and still open and on the dorsal ankle near the crease of the ankle. We have been using silver alginate and using his own stocking. He has nothing open on the left leg or foot 10/25; 2-week follow-up. Not nearly as good on the left buttock as I  was hoping. For open areas with 5 looking threatened small. He has the erythematous irritated chronic skin in this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 debridement done of both wounds. The new wound at the base of between his second and first toes looks stable 04/08/16; continued aggressive debridement of both wounds on the left lower leg. He goes for his venous ablation on Monday. The new wound at the base of his first and second toes dorsally appears stable. 04/15/16; wounds aggressively debridement although the base of this looks considerably better Apligraf #1. He had ablation surgery on Monday I'll need to research these records. We only have approval for four Apligraf's 04/22/16; the patient is here for [name] wound check [Apligraf last week] intake nurse concerned about erythema around the wounds. Apparently [name] significant degree of drainage. The patient has chronic venous inflammation which I think accounts for most of this however I was asked to look at this today 04/26/16; the patient came back for check of possible cellulitis in his left foot however the Apligraf dressing was inadvertently removed therefore we elected to prep the wound for [name] second Apligraf. I put him on doxycycline on 6/1 the erythema in the foot 05/03/16 we did not remove the dressing from the superior wound as this is where I put all of his last Apligraf. Surface debridement done with [name] curette of  the lower wound which looks very healthy. The area on the left foot also looks quite satisfactory at the dorsal artery at the first and second toes 05/10/16; continue Apligraf to this. Her wound, Hydrafera to the lower wound. He has [name] new area on the right second toe. Left dorsal foot firstoosecond toe also looks improved 05/24/16; wound dimensions must be smaller I was able to use Apligraf to all 3 remaining wound areas. 06/07/16 patient's last Apligraf was 2 weeks ago. He arrives today with the 2 wounds on his lateral left leg joined together. This would have to be seen as [name] negative. He also has [name] small wound in his first and second toe on the left dorsally with quite [name] bit of surrounding erythema in the first second and third toes. This looks to be infected or inflamed, very difficult clinical call. 06/21/16: lateral left leg combined wounds. Adherent surface slough area on the left dorsal foot at roughly the fourth toe  looks improved 07/12/16; he now has [name] single linear wound on the lateral left leg. This does not look to be [name] lot changed from when I lost saw this. The area on his dorsal left foot looks considerably better however. 08/02/16; no major change in the substantial area on his left lateral leg since last time. We have been using Hydrofera Blue for [name] prolonged period of time now. The area on his left foot is also unchanged from last review 07/19/16; the area on his dorsal foot on the left looks considerably smaller. He is beginning to have significant rims of epithelialization on the lateral left leg wound. This also looks better. 08/05/16; the patient came in for [name] nurse visit today. Apparently the area on his left lateral leg looks better and it was wrapped. However in general discussion the patient noted [name] new area on the dorsal aspect of his right second toe. The exact etiology of this is unclear but likely relates to pressure. 08/09/16 really the area on the left lateral leg did not  really look that healthy today perhaps slightly larger and measurements. The area on his dorsal right second toe is improved also the left foot wound looks stable to improved 08/16/16; the area on the last lateral leg did not change any of dimensions. Post debridement with [name] curet the area looked better. Left foot wound improved and the area on the dorsal right second toe is improved 08/23/16; the area on the left lateral leg may be slightly smaller both in terms of length and width. Aggressive debridement with [name] curette afterwards the tissue appears healthier. Left foot wound appears improved in the area on the dorsal right second toe is improved 08/30/16 patient developed [name] fever over the weekend and was seen in an urgent care. Felt to have [name] UTI and put on doxycycline. He has been since changed over the phone to [facility]. After we took off the wrap on his right leg today the leg is swollen warm and erythematous, probably more likely the source of the fever 09/06/16; have been using collagen to the major left leg wound, silver alginate to the area on his anterior foot/toes 09/13/16; the areas on his anterior foot/toes on both sides appear to be virtually closed. Extensive wound on the left lateral leg perhaps slightly narrower but each visit still covered an adherent surface slough 09/16/16 patient was in for his usual Thursday nurse visit however the intake nurse noted significant erythema of his dorsal right foot. He is also running [name] low- grade fever and having increasing spasms in the right leg 09/20/16 here for cellulitis involving his right great toes and forefoot. This is [name] lot better. Still requiring debridement on his left lateral leg. Santyl direct says he needs prior authorization. Therefore his wife cannot change this at home 09/30/16; the patient's extensive area on the left lateral calf and ankle perhaps somewhat better. Using Santyl. The area on the left toes is healed and I think  the area on his right dorsal foot is healed as well. There is no cellulitis or venous inflammation involving the right leg. He is going to need compression stockings here. 10/07/16; the patient's extensive wound on the left lateral calf and ankle does not measure any differently however there appears to be less adherent surface slough using Santyl and aggressive weekly debridements 10/21/16; no major change in the area on the left lateral calf. Still the same measurement still very difficult to debridement adherent slough and nonviable  subcutaneous tissue. This is not really been helped by several weeks of Santyl. Previously for 2 weeks I used Iodoflex for [name] short period. [name] prolonged course of Hydrofera Blue didn't really help. I'm not sure why I only used 2 weeks of Iodoflex on this there is no evidence of surrounding infection. He has [name] small area on the right second toe which looks as though it's progressing towards closure 10/28/16; the wounds on his toes appear to be closed. No major change in the left lateral leg wound although the surface looks somewhat better using Iodoflex. He has had previous arterial studies that were normal. He has had reflux studies and is status post ablation although I don't have any exact notes on which vein was ablated. I'll need to check the surgical record 11/04/16; he's had [name] reopening between the first and second toe on the left and right. No major change in the left lateral leg wound. There is what appears to be cellulitis of the left dorsal foot 11/18/16 the patient was hospitalized initially in [place] and then subsequently transferred to [facility] long and was admitted there from 11/09/16 through 11/12/16. He had developed progressive cellulitis on the right leg in spite of the doxycycline I gave him. I'd spoken to the hospitalist in [place] who was concerned about continuing leukocytosis. CT scan is what I suggested this was done which showed soft tissue  swelling without evidence of osteomyelitis or an underlying abscess blood cultures were negative. At [facility] he was treated with vancomycin and Primaxin and then add an infectious disease consult. He was transitioned to Ceftaroline. He has been making progressive improvement. Overall [name] severe cellulitis of the right leg. He is been using silver alginate to her original wound on the left leg. The wounds in his toes on the right are closed there is [name] small open area on the base of the left second toe 11/26/15; the patient's right leg is much better although there is still some edema here this could be reminiscent from his severe cellulitis likely on top of some degree of lymphedema. His left anterior leg wound has less surface slough as reported by her intake nurse. Small wound at the base of the left second toe 12/02/16; patient's right leg is better and there is no open wound here. His left anterior lateral leg wound continues to have [name] healthy-looking surface. Small wound at the base of the left second toe however there is erythema in the left forefoot which is worrisome 12/16/16; is no open wounds on his right leg. We took measurements for stockings. His left anterior lateral leg wound continues to have [name] healthy-looking surface. I'm not sure where we were with the Apligraf run through his insurance. We have been using Iodoflex. He has [name] thick eschar on the left first second toe interface, I suspect this may be fungal however there is no visible open 12/23/16; no open wound on his right leg. He has 2 small areas left of the linear wound that was remaining last week. We have been using Prisma, I thought I have disclosed this week, we can only look forward to next week 01/03/17; the patient had concerning areas of erythema last week, already on doxycycline for UTI through his primary doctor. The erythema is absolutely no better there is warmth and swelling both medially from the left lateral leg wound and  also the dorsal left foot. 01/06/17- Patient is here for follow-up evaluation of his left lateral leg ulcer and bilateral  feet ulcers. He is on oral antibiotic therapy, tolerating that. Nursing staff and the patient states that the erythema is improved from Monday. 01/13/17; the predominant left lateral leg wound continues to be problematic. I had put Apligraf on him earlier this month once. However he subsequently developed what appeared to be an intense cellulitis around the left lateral leg wound. I gave him Dalvance I think on 2/12 perhaps 2/13 he continues on cefdinir. The erythema is still present but the warmth and swelling is improved. I am hopeful that the cellulitis part of this control. I wouldn't be surprised if there is an element of venous inflammation as well. 01/17/17. The erythema is present but better in the left leg. His left lateral leg wound still does not have [name] viable surface buttons certain parts of this long thin wound it appears like there has been improvement in dimensions. 01/20/17; the erythema still present but much better in the left leg. I'm thinking this is his usual degree of chronic venous inflammation. The wound on the left leg looks somewhat better. Is less surface slough 01/27/17; erythema is back to the chronic venous inflammation. The wound on the left leg is somewhat better. I am back to the point where I like to try an Apligraf once again 02/10/17; slight improvement in wound dimensions. Apligraf #2. He is completing his doxycycline 02/14/17; patient arrives today having completed doxycycline last Thursday. This was supposed to be [name] nurse visit however once again he hasn't tense erythema from the medial part of his wound extending over the lower leg. Also erythema in his foot this is roughly in the same distribution as last time. He has baseline chronic venous inflammation however this is [name] lot worse than the baseline I have learned to accept the on him is baseline  inflammation 02/24/17- patient is here for follow-up evaluation. He is tolerating compression therapy. His voicing no complaints or concerns he is here anticipating an Apligraf 03/03/17; he arrives today with an adherent necrotic surface. I don't think this is surface is going to be amenable for Apligraf's. The erythema around his wound and on the left dorsal foot has resolved he is off antibiotics 03/10/17; better-looking surface today. I don't think he can tolerate Apligraf's. He tells me he had [name] wound VAC after [name] skin graft years ago to this area and they had difficulty with [name] seal. The erythema continues to be stable around this some degree of chronic venous inflammation but he also has recurrent cellulitis. We have been using Iodoflex 03/17/17; continued improvement in the surface and may be small changes in dimensions. Using Iodoflex which seems the only thing that will control his surface 03/24/17- He is here for follow up evaluation of his LLE lateral ulceration and ulcer to right dorsal foot/toe space. He is voicing no complaints or concerns, He is tolerating compression wrap. 03/31/17 arrives today with [name] much healthier looking wound on the left lower extremity. We have been using Iodoflex for [name] prolonged period of time which has for the first time prepared and adequate looking wound bed although we have not had much in the way of wound dimension improvement. He also has [name] small wound between the first and second toe on the right 04/07/17; arrives today with [name] healthy-looking wound bed and at least the top 50% of this wound appears to be now her. No debridement was required I have changed him to [facility] last week after prolonged Iodoflex. He did not do well with  Apligraf's. We've had [name] re-opening between the first and second toe on the right 04/14/17; arrives today with [name] healthier looking wound bed contractions and the top 50% of this wound and some on the lesser 50%. Wound bed appears  healthy. The area between the first and second toe on the right still remains problematic 04/21/17; continued very gradual improvement. Using [facility] 04/28/17; continued very gradual improvement in the left lateral leg venous insufficiency wound. His periwound erythema is very mild. We have been using Hydrofera Blue. Wound is making progress especially in the superior 50% 05/05/17; he continues to have very gradual improvement in the left lateral venous insufficiency wound. Both in terms with an length rings are improving. I debrided this every 2 weeks with #5 curet and we have been using Hydrofera Blue and again making good progress With regards to the wounds between his right first and second toe which I thought might of been tinea pedis he is not making as much progress very dry scaly skin over the area. Also the area at the base of the left first and second toe in [name] similar condition 05/12/17; continued gradual improvement in the refractory left lateral venous insufficiency wound on the left. Dimension smaller. Surface still requiring debridement using Hydrofera Blue 05/19/17; continued gradual improvement in the refractory left lateral venous ulceration. Careful inspection of the wound bed underlying rumination suggested some degree of epithelialization over the surface no debridement indicated. Continue Hydrofera Blue difficult areas between his toes first and third on the left than first and second on the right. I'm going to change to silver alginate from silver collagen. Continue ketoconazole as I suspect underlying tinea pedis 05/26/17; left lateral leg venous insufficiency wound. We've been using Hydrofera Blue. I believe that there is expanding epithelialization over the surface of the wound albeit not coming from the wound circumference. This is [name] bit of an odd situation in which the epithelialization seems to be coming from the surface of the wound rather than in the exact circumference.  There is still small open areas mostly along the lateral margin of the wound. ooHe has unchanged areas between the left first and second and the right first second toes which I been treating for tenia pedis 06/02/17; left lateral leg venous insufficiency wound. We have been using Hydrofera Blue. Somewhat smaller from the wound circumference. The surface of the wound remains [name] bit on it almost epithelialized sedation in appearance. I use an open curette today debridement in the surface of all of this especially the edges ooSmall open wounds remaining on the dorsal right first and second toe interspace and the plantar left first second toe and her face on the left 06/09/17; wound on the left lateral leg continues to be smaller but very gradual and very dry surface using Hydrofera Blue 06/16/17 requires weekly debridements now on the left lateral leg although this continues to contract. I changed to silver collagen last week because of dryness of the wound bed. Using Iodoflex to the areas on his first and second toes/web space bilaterally 06/24/17; patient with history of paraplegia also chronic venous insufficiency with lymphedema. Has [name] very difficult wound on the left lateral leg. This has been gradually reducing in terms of with but comes in with [name] very dry adherent surface. High switch to silver collagen [name] week or so ago with hydrogel to keep the area moist. This is been refractory to multiple dressing attempts. He also has areas in his first and second toes  bilaterally in the anterior and posterior web space. I had been using Iodoflex here after [name] prolonged course of silver alginate with ketoconazole was ineffective [question tinea pedis] 07/14/17; patient arrives today with [name] very difficult adherent material over his left lateral lower leg wound. He also has surrounding erythema and poorly controlled edema. He was switched his Santyl last visit which the nurses are applying once during his doctor visit  and once on [name] nurse visit. He was also reduced to 2 layer compression I'm not exactly sure of the issue here. 07/21/17; better surface today after 1 week of Iodoflex. Significant cellulitis that we treated last week also better. [Doxycycline] 07/28/17 better surface today with now 2 weeks of Iodoflex. Significant cellulitis treated with doxycycline. He has now completed the doxycycline and he is back to his usual degree of chronic venous inflammation/stasis dermatitis. He reminds me he has had ablations surgery here 08/04/17; continued improvement with Iodoflex to the left lateral leg wound in terms of the surface of the wound although the dimensions are better. 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 Problematic areas on the plantar aspect of the first second toe web space on the left and the dorsal aspect of the first second toe web space on the right. At one point I felt these were probably related to chronic fungal infections in treated him aggressively for this although we have not made any improvement here. 08/11/17; left lateral leg. Surface continues to improve with the Iodoflex although we are not seeing much improvement in overall wound dimensions. Areas on his plantar left foot and right foot show no improvement. In fact the right foot looks somewhat worse 08/18/17; left lateral leg. We changed to [facility] Blue last week after [name] prolonged course of Iodoflex which helps get the surface better. It appears that the wound with is improved. Continue with difficult areas on the left dorsal first second and plantar first second on the right 09/01/17; patient arrives in clinic today having had [name] temperature of 103 yesterday. He was seen in the ER and [facility]. The patient was concerned he could have cellulitis again in the right leg however they diagnosed him with [name] UTI and he is now on Keflex. He has [name] history of cellulitis which  is been recurrent and difficult but this is been in the left leg, in the past 5 use doxycycline. He does in and out catheterizations at home which are risk factors for UTI 09/08/17; patient will be completing his Keflex this weekend. The erythema on the left leg is considerably better. He has [name] new wound today on the medial part of the right leg small superficial almost looks like [name] skin tear. He has worsening of the area on the right dorsal first and second toe. His major area on the left lateral leg is better. Using Hydrofera Blue on all areas 09/15/17; gradual reduction in width on the long wound in the left lateral leg. No debridement required. He also has wounds on the plantar aspect of his left first second toe web space and on the dorsal aspect of the right first second toe web space. 09/22/17; there continues to be very gradual improvements in the dimensions of the left lateral leg wound. He hasn't round erythematous spot with might be pressure on his wheelchair. There is no evidence obviously of infection no purulence no warmth ooHe has [name] dry scaled area on the plantar aspect of the  left first second toe ooImproved area on the dorsal right first second toe. 09/29/17; left lateral leg wound continues to improve in dimensions mostly with an is still [name] fairly long but increasingly narrow wound. ooHe has [name] dry scaled area on the plantar aspect of his left first second toe web space ooIncreasingly concerning area on the dorsal right first second toe. In fact I am concerned today about possible cellulitis around this wound. The areas extending up his second toe and although there is deformities here almost appears to abut on the nailbed. 10/06/17; left lateral leg wound continues to make very gradual progress. Tissue culture I did from the right first second toe dorsal foot last time grew MRSA and enterococcus which was vancomycin sensitive. This was not sensitive to clindamycin or doxycycline.  He is allergic to Zyvox and sulfa we have therefore arrange for him to have dalvance infusion tomorrow. He is had this in the past and tolerated it well 10/20/17; left lateral leg wound continues to make decent progress. This is certainly reduced in terms of with there is advancing epithelialization.ooThe cellulitis in the right foot looks better although he still has [name] deep wound in the dorsal aspect of the first second toe web space. Plantar left first toe web space on the left I think is making some progress 10/27/17; left lateral leg wound continues to make decent progress. Advancing epithelialization.using Hydrofera Blue ooThe right first second toe web space wound is better-looking using silver alginate ooImprovement in the left plantar first second toe web space. Again using silver alginate 11/03/17 left lateral leg wound continues to make decent progress albeit slowly. Using Hydrofera Blue ooThe right per second toe web space continues to be [name] very problematic looking punched out wound. I obtained [name] piece of tissue for deep culture I did extensively treated this for fungus. It is difficult to imagine that this is [name] pressure area as the patient states other than going outside he doesn't really wear shoes at home ooThe left plantar first second toe web space looked fairly senescent. Necrotic edges. This required debridement oochange to Hydrofera Blue to all wound areas 11/10/17; left lateral leg wound continues to contract. Using Hydrofera Blue ooOn the right dorsal first second toe web space dorsally. Culture I did of this area last week grew MRSA there is not an easy oral option in this patient was multiple antibiotic allergies or intolerances. This was only [name] rare culture isolate I'm therefore going to use Bactroban under silver alginate ooOn the left plantar first second toe web space. Debridement is required here. This is also unchanged 11/17/17; left lateral leg wound continues  to contract using Hydrofera Blue this is no longer the major issue. ooThe major concern here is the right first second toe web space. He now has an open area going from dorsally to the plantar aspect. There is now wound on the inner lateral part of the first toe. Not [name] very viable surface on this. There is erythema spreading medially into the forefoot. ooNo major change in the left first second toe plantar wound 11/24/17; left lateral leg wound continues to contract using Hydrofera Blue. Nice improvement today ooThe right first second toe web space all of this looks [name] lot less angry than last week. I have given him clindamycin and topical Bactroban for MRSA and terbinafine for the possibility of underlining tinea pedis that I could not control with ketoconazole. Looks somewhat better ooThe area on the plantar left first second toe  web space is weeping with dried debris around the wound 12/01/17; left lateral leg wound continues to contract he Hydrofera Blue. It is becoming thinner in terms of with nevertheless it is making good improvement. ooThe right first second toe web space looks less angry but still [name] large necrotic-looking wounds starting on the plantar aspect of the right foot extending between the toes and now extensively on the base of the right second toe. I gave him clindamycin and topical Bactroban for MRSA anterior benefiting for the possibility of underlying tinea pedis. Not looking better today ooThe area on the left first/second toe looks better. Debrided of necrotic debris 12/05/17* the patient was worked in urgently today because over the weekend he found blood on his incontinence bad when he woke up. He was found to have an ulcer by his wife who does most of his wound care. He came in today for [place] to look at this. He has not had [name] history of wounds in his buttocks in spite of his paraplegia. 12/08/17; seen in follow-up today at his usual appointment. He was seen earlier this week  and found to have [name] new wound on his buttock. We also follow him for wounds on the left lateral leg, left first second toe web space and right first second toe web space 12/15/17; we have been using Hydrofera Blue to the left lateral leg which has improved. The right first second toe web space has also improved. Left first second toe web space plantar aspect looks stable. The left buttock has worsened using Santyl. Apparently the buttock has drainage 12/22/17; we have been using Hydrofera Blue to the left lateral leg which continues to improve now 2 small wounds separated by normal skin. He tells [place] he had [name] fever up to 100 yesterday he is prone to UTIs but has not noted anything different. He does in and out catheterizations. The area between the first and second toes today does not look good necrotic surface covered with what looks to be purulent drainage and erythema extending into the third toe. I had gotten this to something that I thought look better last time however it is not look good today. He also has [name] necrotic surface over the buttock wound which is expanded. I thought there might be infection under here so I removed [name] lot of the surface with [name] #5 curet though nothing look like it really needed culturing. He is been using Santyl to this area 12/27/17; his original wound on the left lateral leg continues to improve using Hydrofera Blue. I gave him samples of Baxdella although he was unable to take them out of fear for an allergic reaction ["lump in his throat"].the culture I did of the purulent drainage from his second toe last week showed both enterococcus and [name] set Enterobacter I was also concerned about the erythema on the bottom of his foot although paradoxically although this looks somewhat better today. 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 We've been using silver alginate here. They came up  today that he sleeps in [name] recliner, I'm not sure why but I asked him to stop this 01/03/18; his original wound we've been using Hydrofera Blue is now separated into 2 areas. ooUlcer on his left buttock is better he is off the recliner and sleeping in bed ooFinally both wound areas between his first and second toes also looks some better 01/10/18;  his original wound on the left lateral leg is now separated into 2 wounds we've been using Hydrofera Blue ooUlcer on his left buttock has some drainage. There is [name] small probing site going into muscle layer superiorly.using silver alginate -He arrives today with [name] deep tissue injury on the left heel ooThe wound on the dorsal aspect of his first second toe on the left looks [name] lot betterusing silver alginate ketoconazole ooThe area on the first second toe web space on the right also looks [name] lot bette 01/17/18; his original wound on the left lateral leg continues to progress using Hydrofera Blue ooUlcer on his left buttock also is smaller surface healthier except for [name] small probing site going into the muscle layer superiorly. 2.4 cm of tunneling in this area ooDTI on his left heel we have only been offloading. Looks better than last week no threatened open no evidence of infection oothe wound on the dorsal aspect of the first second toe on the left continues to look like it's regressing we have only been using silver alginate and terbinafine orally ooThe area in the first second toe web space on the right also looks to be [name] lot better using silver alginate and terbinafine I think this was prompted by tinea pedis 01/31/18; the patient was hospitalized in [place] last week apparently for [name] complicated UTI. He was discharged on cefepime he does in and out catheterizations. In the hospital he was discovered M I don't mild elevation of AST and ALT and the terbinafine was stopped.predictably the pressure ulcer on s his buttock looks betterusing silver  alginate. The area on the left lateral leg also is better using Hydrofera Blue. The area between the first and second toes on the left better. First and second toes on the right still substantial but better. Finally the DTI on the left heel has held together and looks like it's resolving 02/07/18-he is here in follow-up evaluation for multiple ulcerations. He has new injury to the lateral aspect of the last issue [name] pressure ulcer, he states this is from adhesive removal trauma. He states he has tried multiple adhesive products with no success. All other ulcers appear stable. The left heel DTI is resolving. We will continue with same treatment plan and follow-up next week. 02/14/18; follow-up for multiple areas. ooHe has [name] new area last week on the lateral aspect of his pressure ulcer more over the posterior trochanter. The original pressure ulcer looks quite stable has healthy granulation. 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 Almost fully epithelialized on the original superior area using Hydrofera Blue ooDTI on the left heel has peeled off this week to reveal [name] small superficial wound under denuded skin and subcutaneous tissue ooBoth areas between the first and second toes look better including nothing open on the left 02/21/18; ooThe patient's wounds on his left ischial tuberosity and posterior left greater trochanter actually looked better. He has [name] large area of irritation around the area which I think is contact dermatitis. I am doubtful that this is fungal ooHis original wound on the left lateral calf continues to improve we have been using Hydrofera Blue ooThere is no open area in the left first second toe web space although there is [name] lot of thick callus ooThe DTI on the left heel required debridement today of necrotic surface eschar and subcutaneous tissue using silver  alginate ooFinally the area on  the right first second toe webspace continues to contract using silver alginate and ketoconazole 02/28/18 ooLeft ischial tuberosity wounds look better using silver alginate. ooOriginal wound on the left calf only has one small open area left using Hydrofera Blue ooDTI on the left heel required debridement mostly removing skin from around this wound surface. Using silver alginate ooThe areas on the right first/second toe web space using silver alginate and ketoconazole 03/08/18 on evaluation today patient appears to be doing decently well as best I can tell in regard to his wounds. This is the first time that I have seen him as he generally is followed by Dr. Dellia Nims. With that being said none of his wounds appear to be infected he does have an area where there is some skin covering what appears to be [name] new wound on the left dorsal surface of his great toe. This is right at the nail bed. With that being said I do believe that debrided away some of the excess skin can be of benefit in this regard. Otherwise he has been tolerating the dressing changes without complication. 03/14/18; patient arrives today with the multiplicity of wounds that we are following. He has not been systemically unwell ooOriginal wound on the left lateral calf now only has 2 small open areas we've been using Hydrofera Blue which should continue ooThe deep tissue injury on the left heel requires debridement today. We've been using silver alginate ooThe left first second toe and the right first second toe are both are reminiscence what I think was tinea pedis. Apparently some of the callus Surface between the toes was removed last week when it started draining. ooPurulent drainage coming from the wound on the ischial tuberosity on the left. 03/21/18-He is here in follow-up evaluation for multiple wounds. There is improvement, he is currently taking doxycycline, culture obtained last week  grew tetracycline sensitive MRSA. He tolerated debridement. The only change to last week's recommendations is to discontinue antifungal cream between toes. He will follow-up next week 03/28/18; following up for multiple wounds;Concern this week is streaking redness and swelling in the right foot. He is going to need antibiotics for this. 03/31/18; follow-up for right foot cellulitis. Streaking redness and swelling in the right foot on 03/28/18. 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 I put him on clindamycin 300 mg every 6 and brought him in for [name] quick check. He has an open wound between his first and second toes on the right foot as [name] potential source. 04/04/18; ooRight foot cellulitis is resolving he is completing clindamycin. This is truly good news ooLeft lateral calf wound which is initial wound only has one small open area inferiorly this is close to healing out. He has compression stockings. We will use Hydrofera Blue right down to the epithelialization of this ooNonviable surface on the left heel which was initially pressure with [name] DTI. We've been using Hydrofera Blue. I'm going to switch this back to silver alginate ooLeft first second toe/tinea pedis this looks better using silver alginate ooRight first second toe tinea pedis using silver alginate ooLarge pressure ulcers on theLeft ischial tuberosity. Small wound here Looks better. I am uncertain about the surface over the large wound. Using silver alginate 04/11/18; ooCellulitis in the right foot is resolved ooLeft lateral calf wound which was his original wounds still has 2 tiny open areas remaining this is just about closed ooNonviable surface on the left heel is better but still requires debridement ooLeft first second  toe/tinea pedis still open using silver alginate ooRight first second toe wound tinea pedis I asked him to go back to using ketoconazole and silver alginate ooLarge pressure ulcers on  the left ischial tuberosity this shear injury here is resolved. Wound is smaller. No evidence of infection using silver alginate 04/18/18; ooPatient arrives with an intense area of cellulitis in the right mid lower calf extending into the right heel area. Bright red and warm. Smaller area on the left anterior leg. He has [name] significant history of MRSA. He will definitely need antibioticsoodoxycycline ooHe now has 2 open areas on the left ischial tuberosity the original large wound and now [name] satellite area which I think was above his initial satellite areas. Not [name] wonderful surface on this satellite area surrounding erythema which looks like pressure related. ooHis left lateral calf wound again his original wound is just about closed ooLeft heel pressure injury still requiring debridement ooLeft first second toe looks [name] lot better using silver alginate ooRight first second toe also using silver alginate and ketoconazole cream also looks better 04/20/18; the patient was worked in early today out of concerns with his cellulitis on the right leg. I had started him on doxycycline. This was 2 days ago. His wife was concerned about the swelling in the area. Also concerned about the left buttock. He has not been systemically unwell no fever chills. No nausea vomiting or diarrhea 04/25/18; the patient's left buttock wound is continued to deteriorate he is using Hydrofera Blue. He is still completing clindamycin for the cellulitis on the right leg although all of this looks better. 05/02/18 ooLeft buttock wound still with [name] lot of drainage and [name] very tightly adherent fibrinous necrotic surface. He has [name] deeper area superiorly ooThe left lateral calf wound is still closed ooDTI wound on the left heel necrotic surface especially the circumference using Iodoflex ooAreas between his left first second toe and right first second toe both look better. Dorsally and the right first second toe he had [name] necrotic  surface although at smaller. In using silver alginate and ketoconazole. I did [name] culture last week which was [name] deep tissue culture of the reminiscence of the open wound on the right first second toe dorsally. This grew [name] few Acinetobacter and [name] few methicillin-resistant staph aureus. Nevertheless the area actually this week looked better. I didn't feel the need to specifically address this at least in terms of systemic antibiotics. 05/09/18; wounds are measuring larger more drainage per our intake. We are using Santyl covered with alginate on the large superficial buttock wounds, Iodosorb on the left heel, ketoconazole and silver alginate to the dorsal first and second toes bilaterally. 05/16/18; ooThe area on his left buttock better in some aspects although the area superiorly over the ischial tuberosity required an extensive debridement.using Santyl ooLeft heel appears stable. Using Iodoflex ooThe areas between his first and second toes are not bad however there is spreading erythema up the dorsal aspect of his left foot this looks like cellulitis again. He is insensate the erythema is really very brilliant.o Erysipelas He went to see an allergist days ago because he was itching part of this he had lab work done. This showed [name] white count of 15.1 with 70% neutrophils. Hemoglobin of 11.4 and [name] platelet count of 659,000. Last white count we had in Epic was [name] 2-1/2 years ago which was 25.9 but he was ill at the time. He was able to show me some lab work that was  done by his primary physician the pattern is about the same. 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 But prompted me to go ahead and do x-rays of both feet and the pelvis rule out osteomyelitis. He also had [name] comprehensive metabolic panel this was reasonably normal his albumin was 3.7 liver function tests BUN/creatinine all normal 05/23/18; x-rays of both his feet from last week were negative for  underlying pulmonary abnormality. The x-ray of his pelvis however showed mild irregularity in the left ischial which may represent some early osteomyelitis. The wound in the left ischial continues to get deeper clearly now exposed muscle. Each week necrotic surface material over this area. Whereas the rest of the wounds do not look so bad. ooThe left ischial wound we have been using Santyl and calcium alginate ooT the left heel surface necrotic debris using Iodoflex o ooThe left lateral leg is still healed ooAreas on the left dorsal foot and the right dorsal foot are about the same. There is some inflammation on the left which might represent contact dermatitis, fungal dermatitis I am doubtful cellulitis although this looks better than last week 05/30/18; CT scan done at Hospital did not show any osteomyelitis or abscess. Suggested the possibility of underlying cellulitis although I don't see [name] lot of evidence of this at the bedside ooThe wound itself on the left buttock/upper thigh actually looks somewhat better. No debridement ooLeft heel also looks better no debridement continue Iodoflex ooBoth dorsal first second toe spaces appear better using Lotrisone. Left still required debridement 06/06/18; ooIntake reported some purulent looking drainage from the left gluteal wound. Using Santyl and calcium alginate ooLeft heel looks better although still [name] nonviable surface requiring debridement ooThe left dorsal foot first/second webspace actually expanding and somewhat deeper. I may consider doing [name] shave biopsy of this area ooRight dorsal foot first/second webspace appears stable to improved. Using Lotrisone and silver alginate to both these areas 06/13/18 ooLeft gluteal surface looks better. Now separated in the 2 wounds. No debridement required. Still drainage. We'll continue silver alginate ooLeft heel continues to look better with Iodoflex continue this for at least another week ooOf  his dorsal foot wounds the area on the left still has some depth although it looks better than last week. We've been using Lotrisone and silver alginate 06/20/18 ooLeft gluteal continues to look better healthy tissue ooLeft heel continues to look better healthy granulation wound is smaller. He is using Iodoflex and his long as this continues continue the Iodoflex ooDorsal right foot looks better unfortunately dorsal left foot does not. There is swelling and erythema of his forefoot. He had minor trauma to this several days ago but doesn't think this was enough to have caused any tissue injury. Foot looks like cellulitis, we have had this problem before 06/27/18 on evaluation today patient appears to be doing [name] little worse in regard to his foot ulcer. 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 Both of which would really be his only options for treating this infection. In the past he has been given and effusion of Orbactiv. This is done very well for him in the past again it's one time dosing IV antibiotic therapy. Subsequently I do believe this is something we're gonna need to see about doing at this point in time. Currently his other  wounds seem to be doing somewhat better in my pinion I'm pretty happy in that regard. 07/03/18 on evaluation today patient's wounds actually appear to be doing fairly well. He has been tolerating the dressing changes without complication. All in all he seems to be showing signs of improvement. In regard to the antibiotics he has been dealing with infectious disease since I saw him last week as far as getting this scheduled. In the end [facility]'s [facility] this coming Friday. In the meantime he has been continuing to perform the dressing changes in such as previous. There does not  appear to be any evidence of infection worsengin at this time. 07/10/18; ooSince I last saw this man 2 weeks ago things have actually improved. IV antibiotics of resulted in less forefoot erythema although there is still some present. He is not systemically unwell ooLeft buttock wounds o2 now have no depth there is increased epithelialization Using silver alginate ooLeft heel still requires debridement using Iodoflex ooLeft dorsal foot still with [name] sizable wound about the size of [name] border but healthy granulation ooRight dorsal foot still with [name] slitlike area using silver alginate 07/18/18; the patient's cellulitis in the left foot is improved in fact I think it is on its way to resolving. ooLeft buttock wounds o2 both look better although the larger one has hypertension granulation we've been using silver alginate ooLeft heel has some thick circumferential redundant skin over the wound edge which will need to be removed today we've been using Iodoflex ooLeft dorsal foot is still [name] sizable wound required debridement using silver alginate ooThe right dorsal foot is just about closed only [name] small open area remains here 07/25/18; left foot cellulitis is resolved ooLeft buttock wounds o2 both look better. Hyper-granulation on the major area ooLeft heel as some debris over the surface but otherwise looks [name] healthier wound. Using silver collagen ooRight dorsal foot is just about closed 07/31/18; arrives with our intake nurse worried about purulent drainage from the buttock. We had hyper-granulation here last week ooHis buttock wounds o2 continue to look better ooLeft heel some debris over the surface but measuring smaller. ooRight dorsal foot unfortunately has openings between the toes ooLeft foot superficial wound looks less aggravated. 08/07/18 ooButtock wounds continue to look better although some of her granulation and the larger medial wound. silver alginate ooLeft heel continues  to look [name] lot better.silver collagen ooLeft foot superficial wound looks less stable. Requires debridement. He has [name] new wound superficial area on the foot on the lateral dorsal foot. ooRight foot looks better using silver alginate without Lotrisone 08/14/2018; patient was in the ER last week diagnosed with [name] UTI. He is now on Cefpodoxime and Macrodantin. ooButtock wounds continued to be smaller. Using silver alginate ooLeft heel continues to look better using silver collagen ooLeft foot superficial wound looks as though it is improving ooRight dorsal foot area is just about healed. 08/21/2018; patient is completed his antibiotics for his UTI. ooHe has 2 open areas on the buttocks. There is still not closed although the surface looks satisfactory. Using silver alginate ooLeft heel continues to improve using silver collagen ooThe bilateral dorsal foot areas which are at the base of his first and second toes/possible tinea pedis are actually stable on the left but worse on the right. The area on the left required debridement of necrotic surface. After debridement I obtained [name] specimen for PCR culture. ooThe right dorsal  foot which is been just about healed last week is now reopened 08/28/2018; culture done on the left dorsal foot showed coag negative staph both staph epidermidis and Lugdunensis. I think this is worthwhile initiating systemic treatment. I will use doxycycline given his long list of allergies. The area on the left heel slightly improved but still requiring debridement. ooThe large wound on the buttock is just about closed whereas the smaller one is larger. Using silver alginate in this area 09/04/2018; patient is completing his doxycycline for the left foot although this continues to be [name] very difficult wound area with very adherent necrotic debris. We are using silver alginate to all his wounds right foot left foot and the small wounds on his buttock, silver collagen on the  left heel. 09/11/2018; once again this patient has intense erythema and swelling of the left forefoot. Lesser degrees of erythema in the right foot. He has [name] long list of allergies and intolerances. I will reinstitute doxycycline. 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 Using silver alginate ooLeft heel also looks better using silver collagen ooUnfortunately both the areas on his feet look worse. The area on the left first second webspace is now gone through to the plantar part of his foot. The area on the left foot anteriorly is irritated with erythema and swelling in the forefoot. 09/25/2018 ooHis wound on the left plantar heel looks better. Using silver collagen ooThe area on the left buttock 2 small remnant areas. One is closed one is still open. Using silver alginate ooThe areas between both his first and second toes look worse. This in spite of long-standing antifungal therapy with ketoconazole and silver alginate which should have antifungal activity ooHe has small areas around his original wound on the left calf one is on the bottom of the original scar tissue and one superiorly both of these are small and superficial but again given wound history in this site this is worrisome 10/02/2018 ooLeft plantar heel continues to gradually contract using silver collagen ooLeft buttock wound is unchanged using silver alginate ooThe areas on his dorsal feet between his first and second toes bilaterally look about the same. 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 Small open area here. We have been using silver alginate 10/09/2018; ooLeft plantar heel continues to look healthy and contract. No debridement is required ooLeft buttock slightly smaller with [name]  tape injury wound just below which was new this week ooDorsal feet somewhat improved I have been using clindamycin ooLeft lateral looks lower extremity the actual open area looks worse although [name] lot of this is epithelialized. I am going to change to silver collagen today He has [name] lot more swelling in the right leg although this is not pitting not red and not particularly warm there is [name] lot of spasm in the right leg usually indicative of people with paralysis of some underlying discomfort. We have reviewed his vascular status from 2017 he had [name] left greater saphenous vein ablation. I wonder about referring him back to vascular surgery if the area on the left leg continues to deteriorate. 10/16/2018 in today for follow-up and management of multiple lower extremity ulcers. His left Buttock wound is much lower smaller and almost closed completely. The wound to the left ankle has  began to reopen with Epithelialization and some adherent slough. He has multiple new areas to the left foot and leg. The left dorsal foot without much improvement. Wound present between left great webspace and 2nd toe. Erythema and edema present right leg. Right LE ultrasound obtained on 10/10/18 was negative for DVT . 10/23/2018; ooLeft buttock is closed over. Still dry macerated skin but there is no open wound. I suspect this is chronic pressure/moisture ooLeft lateral calf is quite [name] bit worse than when I saw this last. There is clearly drainage here he has macerated skin into the left plantar heel. We will change the primary dressing to alginate ooLeft dorsal foot has some improvement in overall wound area. Still using clindamycin and silver alginate ooRight dorsal foot about the same as the left using clindamycin and silver alginate ooThe erythema in the right leg has resolved. He is DVT rule out was negative ooLeft heel pressure area required debridement although the wound is smaller and the surface is  health 10/26/2018 ooThe patient came back in for his nurse check today predominantly because of the drainage coming out of the left lateral leg with [name] recent reopening of his original wound on the left lateral calf. He comes in today with [name] large amount of surrounding erythema around the wound extending from the calf into the ankle and even in the area on the dorsal foot. He is not systemically unwell. He is not febrile. Nevertheless this looks like cellulitis. We have been using silver alginate to the area. I changed him to [name] regular visit and I am going to prescribe him doxycycline. The rationale here is [name] long list of medication intolerances and [name] history of MRSA. I did not see anything that I thought would provide [name] valuable culture 10/30/2018 ooFollow-up from his appointment 4 days ago with really an extensive area of cellulitis in the left calf left lateral ankle and left dorsal foot. I put him on doxycycline. He has [name] long list of medication allergies which are true allergy reactions. 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 In any case he is [name] lot better today. The erythema especially in the anterior and lateral left calf is better. He still has left ankle erythema. He also is complaining about increasing edema in the right leg we have only been using Kerlix Coban and he has been doing the wraps at home. Finally he has [name] spotty rash on the medial part of his upper left calf which looks like folliculitis or perhaps wrap occlusion type injury. Small superficial macules not pustules 11/06/18 patient arrives today with again [name] considerable degree of erythema around the wound on the left lateral calf extending into the dorsal ankle and dorsal foot. This is [name] lot worse than when I saw this last week. He is on doxycycline really with not [name] lot of improvement. He has not been systemically unwell Wounds on the; left heel actually  looks improved. Original area on the left foot and proximity to the first and second toes looks about the same. He has superficial areas on the dorsal foot, anterior calf and then the reopening of his original wound on the left lateral calf which looks about the same ooThe only area he has on the right is the dorsal webspace first and second which is smaller. ooHe has [name] large area of dry erythematous skin on the left buttock small open area here. 11/13/2018; the patient  arrives in much better condition. The erythema around the wound on the left lateral calf is [name] lot better. Not sure whether this was the clindamycin or the TCA and ketoconazole or just in the improvement in edema control [stasis dermatitis]. In any case this is [name] lot better. The area on the left heel is very small and just about resolved using silver collagen we have been using silver alginate to the areas on his dorsal feet 11/20/2018; his wounds include the left lateral calf, left heel, dorsal aspects of both feet just proximal to the first second webspace. He is stable to slightly improved. I did not think any changes to his dressings were going to be necessary 11/27/2018 he has [name] reopening on the left buttock which is surrounded by what looks like tinea or perhaps some other form of dermatitis. The area on the left dorsal foot has some erythema around it I have marked this area but I am not sure whether this is cellulitis or not. Left heel is not closed. Left calf the reopening is really slightly longer and probably worse 1/13; in general things look better and smaller except for the left dorsal foot. Area on the left heel is just about closed, left buttock looks better only [name] small wound remains in the skin looks better [using Lotrisone] 1/20; the area on the left heel only has [name] few remaining open areas here. Left lateral calf about the same in terms of size, left dorsal foot slightly larger right lateral foot still not closed.  The area on the left buttock has no open wound and the surrounding skin looks [name] lot better 1/27; the area on the left heel is closed. Left lateral calf better but still requiring extensive debridements. The area on his left buttock is closed. He still has the open areas on the left dorsal foot which is slightly smaller in the right foot which is slightly expanded. We have been using Iodoflex on these areas as well 2/3; left heel is closed. Left lateral calf still requiring debridement using Iodoflex there is no open area on his left buttock however he has dry scaly skin over [name] large area of this. Not really responding well to the Lotrisone. Finally the areas on his dorsal feet at the level of the first second webspace are slightly smaller on the right and about the same on the left. Both of these vigorously debrided with Anasept and gauze 2/10; left heel remains closed he has dry erythematous skin over the left buttock but there is no open wound here. Left lateral leg has come in and with. Still requiring debridement we have been using Iodoflex here. Finally the area on the left dorsal foot and right dorsal foot are really about the same extremely dry callused fissured areas. He does not yet have [name] dermatology appointment 2/17; left heel remains closed. He has [name] new open area on the left buttock. The area on the left lateral calf is bigger longer and still covered in necrotic debris. No major change in his foot areas bilaterally. I am awaiting for [name] dermatologist to look on this. We have been using ketoconazole I do not know that this is been doing any good at all. 2/24; left heel remains closed. The left buttock wound that was new reopening last week looks better. The left lateral calf appears better also although still requires debridement. The major area on his foot is the left first second also requiring debridement. We have been putting  Prisma on all wounds. I do not believe that the ketoconazole  has done too much good for his feet. He will use Lotrisone I am going to give him [name] 2-week course of terbinafine. We still do not have [name] dermatology appointment 3/2 left heel remains closed however there is skin over bone in this area I pointed this out to him today. The left buttock wound is epithelialized but still does not look completely stable. The area on the left leg required debridement were using silver collagen here. With regards to his feet we changed to Lotrisone last week and silver alginate. 3/9; left heel remains closed. Left buttock remains closed. The area on the right foot is essentially closed. The left foot remains unchanged. Slightly smaller on the left lateral calf. Using silver collagen to both of these areas 3/16-Left heel remains closed. Area on right foot is closed. Left lateral calf above the lateral malleolus open wound requiring debridement with easy bleeding. Left dorsal wound proximal to first toe also debrided. Left ischial area open new. Patient has been using Prisma with wrapping every 3 days. Dermatology appointment is apparently tomorrow.Patient has completed his terbinafine 2-week course with some apparent improvement according to him, there is still flaking and dry skin in his foot on the left 3/23; area on the right foot is reopened. The area on the left anterior foot is about the same still [name] very necrotic adherent surface. He still has the area on the left leg and reopening is on the left buttock. He apparently saw dermatology although I do not have [name] note. According to the patient who is usually fairly well informed they did not have any good ideas. Put him on oral terbinafine which she is been on before. 3/30; using silver collagen to all wounds. Apparently his dermatologist put him on doxycycline and rifampin presumably some culture grew staph. I do not have this result. He remains on terbinafine although I have used terbinafine on him before 4/6; patient  has had [name] fairly substantial reopening on the right foot between the first and second toes. He is finished his terbinafine and I believe is on doxycycline and rifampin still as prescribed by dermatology. We have been using silver collagen to all his wounds although the patient reports that he thinks silver alginate does better on the wounds on his buttock. 4/13; the area on his left lateral calf about the same size but it did not require debridement. ooLeft dorsal foot just proximal to the webspace between the first and second toes is about the same. Still nonviable surface. I note some superficial bronze discoloration of the dorsal part of his foot ooRight dorsal foot just proximal to the first and second toes also looks about the same. I still think there may be the same discoloration I noted above on the left ooLeft buttock wound looks about the same 4/20; left lateral calf appears to be gradually contracting using silver collagen. 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 The left dorsal foot wound is debrided of tightly adherent necrotic debris and really cleans up quite nicely. The right area is worse with expansion. I did not debride this it is now over the base of the second toe ooThe area on his left buttock is smaller no debridement is required using silver collagen 5/4; left calf continues to make good progress. ooHe arrives with erythema around the wounds on his dorsal foot which even extends to the plantar aspect.  Very concerning for coexistent infection. He is finished the erythromycin I gave him for possible erythrasma this does not seem to have helped. ooThe area on the left foot is about the same base of the dorsal toes ooIs area on the buttock looks improved on the left 5/11; left calf and left buttock continued to make good progress. Left foot is about the same to slightly improved. ooMajor problem is on the  right foot. He has not had an x-ray. Deep tissue culture I did last week showed both Enterobacter and E. coli. I did not change the doxycycline I put him on empirically although neither 1 of these were plated to doxycycline. He arrives today with the erythema looking worse on both the dorsal and plantar foot. Macerated skin on the bottom of the foot. he has not been systemically unwell 5/18-Patient returns at 1 week, left calf wound appears to be making some progress, left buttock wound appears slightly worse than last time, left foot wound looks slightly better, right foot redness is marginally better. X-ray of both feet show no air or evidence of osteomyelitis. Patient is finished his Omnicef and terbinafine. He continues to have macerated skin on the bottom of the left foot as well as right 5/26; left calf wound is better, left buttock wound appears to have multiple small superficial open areas with surrounding macerated skin. X-rays that I did last time showed no evidence of osteomyelitis in either foot. He is finished cefdinir and doxycycline. I do not think that he was on terbinafine. He continues to have [name] large superficial open area on the right foot anterior dorsal and slightly between the first and second toes. I did send him to dermatology 2 months ago or so wondering about whether they would do [name] fungal scraping. I do not believe they did but did do [name] culture. We have been using silver alginate to the toe areas, he has been using antifungals at home topically either ketoconazole or Lotrisone. We are using silver collagen on the left foot, silver alginate on the right, silver collagen on the left lateral leg and silver alginate on the left buttock 6/1; left buttock area is healed. We have the left dorsal foot, left lateral leg and right dorsal foot. We are using silver alginate to the areas on both feet and silver collagen to the area on his left lateral calf 6/8; the left buttock apparently  reopened late last week. He is not really sure how this happened. He is tolerating the terbinafine. Using silver alginate to all wounds 6/15; left buttock wound is larger than last week but still superficial. ooCame in the clinic today with [name] report of purulence from the left lateral leg I did not identify any infection ooBoth areas on his dorsal feet appear to be better. He is tolerating the terbinafine. Using silver alginate to all wounds 6/22; left buttock is about the same this week, left calf quite [name] bit better. His left foot is about the same however he comes in with erythema and warmth in the right forefoot once again. Culture that I gave him in the beginning of May showed Enterobacter and E. coli. I gave him doxycycline and things seem to improve although neither 1 of these organisms was specifically plated. 6/29; left buttock is larger and dry this week. Left lateral calf looks to me to be improved. Left dorsal foot also somewhat improved right foot completely unchanged. The erythema on the right foot is still present. He is  completing the Ceftin dinner that I gave him empirically [see discussion above.) 7/6 - All wounds look to be stable and perhaps improved, the left buttock wound is slightly smaller, per patient bleeds easily, completed ceftin, the right foot redness is less, he is on terbinafine 7/13; left buttock wound about the same perhaps slightly narrower. Area on the left lateral leg continues to narrow. Left dorsal foot slightly smaller right foot about the same. We are using silver alginate on the right foot and Hydrofera Blue to the areas on the left. Unna boot on the left 2 layer compression on the right 7/20; left buttock wound absolutely the same. Area on lateral leg continues to get better. Left dorsal foot require debridement as did the right no major change in the 7/27; left buttock wound the same size necrotic debris over the surface. The area on the lateral leg is  closed once again. His left foot looks better right foot about the same although there is some involvement now of the posterior first second toe area. He is still on terbinafine which I have given him for [name] month, not certain [name] centimeter major change 06/25/19-All wounds appear to be slightly improved according to report, left buttock wound looks clean, both foot wounds have minimal to no debris the right dorsal foot has minimal slough. We are using Hydrofera Blue to the left and silver alginate to the right foot and ischial wound. 8/10-Wounds all appear to be around the same, the right forefoot distal part has some redness which was not there before, however the wound looks clean and small. Ischial wound looks about the same with no changes 8/17; his wound on the left lateral calf which was his original chronic venous insufficiency wound remains closed. Since I last saw him the areas on the left dorsal foot right dorsal foot generally appear better but require debridement. The area on his left initial tuberosity appears somewhat larger to me perhaps hyper granulated and bleeds very easily. We have been using Hydrofera Blue to the left dorsal foot and silver alginate to everything else 8/24; left lateral calf remains closed. The areas on his dorsal feet on the webspace of the first and second toes bilaterally both look better. The area on the left buttock which is the pressure ulcer stage II slightly smaller. I change the dressing to Hydrofera Blue to all areas 8/31; left lateral calf remains closed. The area on his dorsal feet bilaterally look better. Using Hydrofera Blue. Still requiring debridement on the left foot. No change in the left buttock pressure ulcers however 9/14; left lateral calf remains closed. Dorsal feet look quite [name] bit better than 2 weeks ago. Flaking dry skin also [name] lot better with the ammonium lactate I gave him 2 weeks ago. The area on the left buttock is improved. He states that  his Roho cushion developed [name] leak and he is getting [name] new one, in the interim he is offloading this vigorously 9/21; left calf remains closed. Left heel which was [name] possible DTI looks better this week. He had macerated tissue around the left dorsal foot right foot looks satisfactory and improved left buttock wound. I changed his dressings to his feet to silver alginate bilaterally. Continuing Hydrofera Blue on the left buttock. 9/28 left calf remains closed. Left heel did not develop anything [possible DTI] dry flaking skin on the left dorsal foot. Right foot looks satisfactory. Improved left buttock wound. We are using silver alginate on his feet Hydrofera Blue  on the buttock. I have asked him to go back to the Lotrisone on his feet including the wounds and surrounding areas 10/5; left calf remains closed. The areas on the left and right feet about the same. [name] lot of this is epithelialized however debris over the remaining open areas. He is using Lotrisone and silver alginate. The area on the left buttock using Hydrofera Blue 10/26. Patient has been out for 3 weeks secondary to Covid concerns. He tested negative but I think his wife tested positive. He comes in today with the left foot substantially worse, right foot about the same. Even more concerning he states that the area on his left buttock closed over but then reopened and is considerably deeper in one aspect than it was before [stage III wound] 11/2; left foot really about the same as last week. Quarter sized wound on the dorsal foot just proximal to the first second toes. Surrounding erythema with areas of denuded epithelium. This is not really much different looking. Did not look like cellulitis this time however. ooRight foot area about the same.. We have been using silver alginate alginate on his toes ooLeft buttock still substantial irritated skin around the wound which I think looks somewhat better. We have been using Hydrofera Blue  here. 11/9; left foot larger than last week and [name] very necrotic surface. Right foot I think is about the same perhaps slightly smaller. Debris around the circumference also addressed. Unfortunately on the left buttock there is been [name] decline. Satellite lesions below the major wound distally and now [name] an additional one posteriorly we have been using Hydrofera Blue but I think this is [name] pressure issue 11/16; left foot ulcer dorsally again [name] very adherent necrotic surface. Right foot is about the same. Not much change in the pressure ulcer on his left buttock. 11/30; left foot ulcer dorsally basically the same as when I saw him 2 weeks ago. Very adherent fibrinous debris on the wound surface. Patient reports [name] lot of drainage as well. 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 We have been using Iodoflex, patient changed back to alginate because of the drainage. Area on his right dorsal foot really looks benign with [name] healthier surface certainly [name] lot better than on the left. Left buttock wounds all improved using Hydrofera Blue 12/7; left dorsal foot again no improvement. Tightly adherent debris. PCR culture I did last week only showed likely skin contaminant. I have gone ahead and done [name] punch biopsy of this which is about the last thing in terms of investigations I can think to do. He has known venous insufficiency and venous hypertension and this could be the issue here. The area on the right foot is about the same left buttock slightly worse according to our intake nurse secondary to [facility] Blue sticking to the wound 12/14; biopsy of the left foot that I did last time showed changes that could be related to wound healing/chronic stasis dermatitis phenomenon no neoplasm. We have been using silver alginate to both feet. I change the one on the left today to Sorbact and silver alginate to his other 2 wounds 12/28; the patient arrives with the  following problems; ooMajor issue is the dorsal left foot which continues to be [name] larger deeper wound area. Still with [name] completely nonviable surface ooParadoxically the area mirror image on the right on the right dorsal foot appears to be getting better. ooHe had some loss of dry  denuded skin from the lower part of his original wound on the left lateral calf. Some of this area looked [name] little vulnerable and for this reason we put him in wrap that on this side this week ooThe area on his left buttock is larger. He still has the erythematous circular area which I think is [name] combination of pressure, sweat. This does not look like cellulitis or fungal dermatitis 11/26/2019; -Dorsal left foot large open wound with depth. Still debris over the surface. Using Sorbact ooThe area on the dorsal right foot paradoxically has closed over [facility] has [name] reopening on the left ankle laterally at the base of his original wound that extended up into the calf. This appears clean. ooThe left buttock wound is smaller but with very adherent necrotic debris over the surface. We have been using silver alginate here as well The patient had arterial studies done in 2017. He had biphasic waveforms at the dorsalis pedis and posterior tibial bilaterally. ABI in the left was 1.17. Digit waveforms were dampened. He has slight spasticity in the great toes I do not think [name] TBI would be possible 1/11; the patient comes in today with [name] sizable reopening between the first and second toes on the right. This is not exactly in the same location where we have been treating wounds previously. According to our intake nurse this was actually fairly deep but 0.6 cm. The area on the left dorsal foot looks about the same the surface is somewhat cleaner using Sorbact, his MRI is in 2 days. We have not managed yet to get arterial studies. The new reopening on the left lateral calf looks somewhat better using alginate. The left buttock wound is  about the same using alginate 1/18; the patient had his ARTERIAL studies which were quite normal. ABI in the right at 1.13 with triphasic/biphasic waveforms on the left ABI 1.06 again with triphasic/biphasic waveforms. It would not have been possible to have done [name] toe brachial index because of spasticity. We have been using Sorbac to the left foot alginate to the rest of his wounds on the right foot left lateral calf and left buttock 1/25; arrives in clinic with erythema and swelling of the left forefoot worse over the first MTP area. This extends laterally dorsally and but also posteriorly. Still has an area on the left lateral part of the lower part of his calf wound it is eschared and clearly not closed. ooArea on the left buttock still with surrounding irritation and erythema. ooRight foot surface wound dorsally. The area between the right and first and second toes appears better. 2/1; ooThe left foot wound is about the same. Erythema slightly better I gave him [name] week of doxycycline empirically ooRight foot wound is more extensive extending between the toes to the plantar surface ooLeft lateral calf really no open surface on the inferior part of his original wound however the entire area still looks vulnerable ooAbsolutely no improvement in the left buttock wound required debridement. 2/8; the left foot is about the same. Erythema is slightly improved I gave him clindamycin last week. ooRight foot looks better he is using Lotrimin and silver alginate ooHe has [name] breakdown in the left lateral calf. Denuded epithelium which I have removed ooLeft buttock about the same were using Hydrofera Blue 2/15; left foot is about the same there is less surrounding erythema. Surface still has tightly adherent debris which I have debriding however not making any progress ooRight foot has [name] substantial wound on  the medial right second toe between the first and second webspace. ooStill an open area  on the left lateral calf distal area. ooButtock wound is about the same 2/22; left foot is about the same less surrounding erythema. Surface has adherent debris. Polymen Ag Right foot area significant wound between the first and second toes. We have been using silver alginate here Left lateral leg polymen Ag at the base of his original venous insufficiency wound ooLeft buttock some improvement here 3/1; ooRight foot is deteriorating in the first second toe webspace. Larger and more substantial. We have been using silver alginate. ooLeft dorsal foot about the same markedly adherent surface debris using PolyMem Ag ooLeft lateral calf surface debris using PolyMem AG ooLeft buttock is improved again using PolyMem Ag. ooHe is completing his terbinafine. The erythema in the foot seems better. He has been on this for 2 weeks 3/8; no improvement in any wound area in fact he has [name] small open area on the dorsal midfoot which is new this week. He has not gotten his foot x-rays yet 3/15; his x-rays were both negative for osteomyelitis of both feet. No major change in any of his wounds on the extremities however his buttock wounds are better. We have been using polymen on the buttocks, left lower leg. Iodoflex on the left foot and silver alginate on the right 3/22; arrives in clinic today with the 2 major issues are the improvement in the left dorsal foot wound which for once actually looks healthy with [name] nice healthy wound surface without debridement. Using Iodoflex here. Unfortunately on the left lateral calf which is in the distal part of his original wound he came to the clinic here for there was purulent drainage noted some increased breakdown scattered around the original area and [name] small area proximally. We we are using polymen here will change to silver alginate today. His buttock wound on the left is better and I think the area on the right first second toe webspace is also improved 3/29; left  dorsal foot looks better. Using Iodoflex. Left ankle culture from deterioration last time grew E. coli, Enterobacter and Enterococcus. I will give him [name] course of cefdinir although that will not cover Enterococcus. The area on the right foot in the webspace of the first and second toe lateral first toe looks better. The area on his buttock is about healed Vascular appointment is on April 21. This is to look at his venous system vis--vis continued breakdown of the wounds on the left including the left lateral leg and left dorsal foot he. He has had previous ablations on this side 4/5; the area between the right first and second toes lateral aspect of the first toe looks better. Dorsal aspect of the left first toe on the left foot also improved. Unfortunately the left lateral lower leg is larger and there is [name] second satellite wound superiorly. The usual superficial abrasions on the left buttock overall better but certainly not closed 4/12; the area between the right first and second toes is improved. Dorsal aspect of the left foot also slightly smaller with [name] vibrant healthy looking surface. No real change in the left lateral leg and the left buttock wound is healed He has an unaffordable co-pay for Apligraf. Appointment with vein and vascular with regards to the left leg venous part of the circulation is on 4/21 4/19; we continue to see improvement in all wound areas. Although this is minor. He has his vascular appointment on  4/21. The area on the left buttock has not reopened although right in the center of this area the skin looks somewhat threatened 4/26; the left buttock is unfortunately reopened. In general his left dorsal foot has [name] healthy surface and looks somewhat smaller although it was not measured as such. The area between his first and second toe webspace on the right as [name] small wound against the first toe. The patient saw vascular surgery. The real question I was asking was about the  small saphenous vein on the left. He has previously ablated left greater saphenous vein. Nothing further was commented on on the left. Right greater saphenous vein without reflux at the saphenofemoral junction or proximal thigh there was no indication for ablation of the right greater saphenous vein duplex was negative for DVT bilaterally. They did not think there was anything from [name] vascular surgery point of view that could be offered. They ABIs within normal limits 5/3; only small open area on the left buttock. The area on the left lateral leg which was his original venous reflux is now 2 wounds both which look clean. We are using Iodoflex on the left dorsal foot which looks healthy and smaller. He is down to [name] very tiny area between the right first and second toes, using silver alginate 5/10; all of his wounds appear better. We have much better edema control in 4 layer compression on the left. This may be the factor that is allowing the left foot and left lateral calf to heal. He has external compression garments at home 04/14/20-All of his wounds are progressing well, the left forefoot is practically closed, left ischium appears to be about the same, right toe webspace is also smaller. The left lateral leg is about the same, continue using Hydrofera Blue to this, silver alginate to the ischium, Iodoflex to the toe space on the right 6/7; most of his wounds outside of the left buttock are doing well. The area on the left lateral calf and left dorsal foot are smaller. The area on the right foot in between the first and second toe webspace is barely visible although he still says there is some drainage here is the only reason I did not heal this out. ooUnfortunately the area on the left buttock almost looks like he has [name] skin tear from tape. He has open wound and then [name] large flap of skin that we are trying to get adherence over an area just next to the remaining wound 6/21; 2 week follow-up. I  believe is been here for nurse visits. Miraculously the area between his first and second toes on the left dorsal foot is closed over. Still open on the right first second web space. The left lateral calf has 2 open areas. Distally this is more superficial. The proximal area had [name] little more depth and required debridement of adherent necrotic material. His buttock wound is actually larger we have been using silver alginate here 6/28; the patient's area on the left foot remains closed. Still open wet area between the first and second toes on the right and also extending into the plantar aspect. We have been using silver alginate in this location. He has 2 areas on the left lower leg part of his original long wounds which I think are better. We have been using Hydrofera Blue here. Hydrofera Blue to the left buttock which is stable 7/12; left foot remains closed. Left ankle is closed. May be [name] small area between his right  first and second toes the only truly open area is on the left buttock. We have been using Hydrofera Blue here 7/19; patient arrives with marked deterioration especially in the left foot and ankle. We did not put him in [name] compression wrap on the left last week in fact he wore his juxta lite stockings on either side although he does not have an underlying stocking. He has [name] reopening on the left dorsal foot, left lateral ankle and [name] new area on the right dorsal ankle. More worrisome is the degree of erythema on the left foot extending on the lateral foot into the lateral lower leg on the left 7/26; the patient had erythema and drainage from the lateral left ankle last week. Culture of this grew MRSA resistant to doxycycline and clindamycin which are the 2 antibiotics we usually use with this patient who has multiple antibiotic allergies including linezolid, trimethoprim sulfamethoxazole. I had give him an empiric doxycycline and he comes in the area certainly looks somewhat better although  it is blotchy in his lower leg. He has not been systemically unwell. He has had areas on the left dorsal foot which is [name] reopening, chronic wounds on the left lateral ankle. Both of these I think are secondary to chronic venous insufficiency. The area between his first and second toes is closed as far as I can tell. He had [name] new wrap injury on the right dorsal ankle last week. Finally he has an area on the left buttock. We have been using silver alginate to everything except the left buttock we are using Hydrofera Blue 06/30/20-Patient returns at 1 week, has been given [name] sample dose pack of NUZYRA which is [name] tetracycline derivative [omadacycline], patient has completed those, we have been using silver alginate to almost all the wounds except the left ischium where we are using Hydrofera Blue all of them look better 8/16; since I last saw the patient he has been doing well. The area on the left buttock, left lateral ankle and left foot are all closed today. He has completed the [place] I gave him last time and tolerated this well. He still has open areas on the right dorsal ankle and in the right first second toe area which we are using silver alginate. 8/23; we put him in his bilateral external compression stockings last week as he did not have anything open on either leg except for concerning area between the right first and second toe. He comes in today with an area on the left dorsal foot slightly more proximal than the original wound, the left lateral foot but this is actually [name] continuation of the area he had on the left lateral ankle from last time. As well he is opened up on the left buttock again. 8/30; comes in today with things looking [name] lot better. The area on the left lower ankle has closed down as has the left foot but with eschar in both areas. The area on the dorsal right ankle is also epithelialized. Very little remaining of the left buttock wound. We have been using silver alginate on all  wound areas 9/13; the area in the first second toe webspace on the right has fully epithelialized. He still has some vulnerable epithelium on the right and the ankle and the dorsal foot. He notes weeping. He is using his juxta lite stocking. On the left again the left dorsal foot is closed left lateral ankle is closed. We went to the juxta lite stocking here  as well. ooStill vulnerable in the left buttock although only 2 small open areas remain here 9/27; 2-week follow-up. We did not look at his left leg but the patient says everything is closed. He is [name] bit disturbed by the amount of edema in his left foot he is using juxta lite stockings but asking about over the toes stockings which would be 30/40, will talk to him next time. According to him there is no open wound on either the left foot or the left ankle/calf He has an open area on the dorsal right calf which I initially point [name] wrap injury. He has superficial remaining wound on the left ischial tuberosity been using silver alginate although he says this sticks to the wound 10/5; we gave him 2-week follow-up but he called yesterday expressing some concerns about his right foot right ankle and the left buttock. He came in early. There is still no open areas on the left leg and that still in his juxta lite stocking 10/11; he only has 1 small area on the left buttock that remains measuring millimeters 1 mm. Still has the same irritated skin in this area. We recommended zinc oxide when this eventually closes and pressure relief is meticulously is he can do this. He still has an area on the dorsal part of his right first through third toes which is [name] bit irritated and still open and on the dorsal ankle near the crease of the ankle. We have been using silver alginate and using his own stocking. He has nothing open on the left leg or foot 10/25; 2-week follow-up. Not nearly as good on the left buttock as I was hoping. For open areas with 5 looking  threatened small. He has the erythematous irritated chronic skin in this area. oo1 area on the right dorsal ankle. He reports this area bleeds easily ooRight dorsal foot just proximal to the base of his toes ooWe have been using silver alginate. 11/8; 2-week follow-up. Left buttock is about the same although I do not think the wounds are in the same location we have been using silver alginate. I have asked him to use zinc oxide on the skin around the wounds. ooHe still has [name] small area on the right dorsal ankle he reports this bleeds easily ooRight dorsal foot just proximal to the base of the toes does not have anything open although the skin is very dry and scaly ooHe has [name] new opening on the nailbed of the left great toe. Nothing on the left ankle 11/29; 3-week follow-up. Left buttock has 2 open areas. And washing of these wounds today started bleeding easily. Suggesting very friable tissue. We have been using silver alginate. Right dorsal ankle which I thought was initially [name] wrap injury we have been using silver alginate. Nothing open between the toes that I can see. He states the area on the left dorsal toe nailbed healed after the last visit in 2 or 3 days 12/13; 3-week follow-up. His left buttock now has 3 open areas but the original 2 areas are smaller using polymen here. Surrounding skin looks better. The right dorsal ankle is closed. He has [name] small opening on the right dorsal foot at the level of the third toe. In general the skin looks better here. He is wearing his juxta lite stocking on the left leg says there is nothing open 11/24/2020; 3 weeks follow-up. His left buttock still has the 3 open areas. We have been using polymen but due  to lack of response he changed to [facility] area. Surrounding skin is dry erythematous and irritated looking. There is no evidence of infection either bacterial or fungal however there is loss of surface epithelium ooHe still has very dry skin  in his foot causing irritation and erythema on the dorsal part of his toes. This is not responded to prolonged courses of antifungal simply looks dry and irritated 1/24; left buttock area still looks about the same he was unable to find the triad ointment that we had suggested. The area on the right lower leg just above the dorsal ankle has reopened and the areas on the right foot between the first second and second third toes and scaling on the bottom of the foot has been about the same for quite some time now. been using silver alginate to all wound areas 2/7; left buttock wound looked quite good although not much smaller in terms of surface area surrounding skin looks better. Only [name] few dry flaking areas on the right foot in between the first and second toes the skin generally looks better here [ammonium lactate]. Finally the area on the right dorsal ankle is closed 2/21; ooThere is no open area on the right foot even between the right first and second toe. Skin around this area dorsally and plantar aspects look better. ooHe has [name] reopening of the area on the right ankle just above the crease of the ankle dorsally. I continue to think that this is probably friction from spasms may be even this time with his stocking under the compression stockings. ooWounds on his left buttock look about the same there [name] couple of areas that have reopened. He has [name] total square area of loss of epithelialization. This does not look like infection it looks like [name] contact dermatitis but I just cannot determine to what 3/14; there is nothing on the right foot between the first and second toes this was carefully inspected under illumination. Some chronic irritation on the dorsal part of his foot from toes 1-3 at the base. Nothing really open here substantially. Still has an area on the right foot/ankle that is actually larger and hyper granulated. His buttock area on the left is just about closed however he has  chronic inflammation with loss of the surface epithelial layer 3/28; 2-week follow-up. In clinic today with [name] new wound on the left anterior mid tibia. Says this happened about 2 weeks ago. He is not really sure how wonders about the spasticity of his legs at night whether that could have caused this other than that he does not have [name] good idea. He has been using topical antibiotics and silver alginate. The area on his right dorsal ankle seems somewhat better. ooFinally everything on his left buttock is closed. 4/11; 2-week follow-up. All of his wounds are better except for the area over the ischium and left buttock which have opened up widely again. At least part of this is covered in necrotic fibrinous material another part had rolled nonviable skin. The area on the right ankle, left anterior mid tibia are both [name] lot better. He had no open wounds on either foot including the areas between the first and second toes 4/25; patient presents for 2-week follow-up. He states that the wounds are overall stable. He has no complaints today and states he is using Hydrofera Blue to open wounds. 5/9; have not seen this man in over [name] month. For my memory he has open areas on the left  mid tibia and right ankle. T oday he has new open area on the right dorsal foot which we have not had [name] problem with recently. He has the sustained area on the left buttock He is also changed his insurance at the beginning of the year [employer]. We will need prior authorizations for debridement 5/23; patient presents for 2-week follow-up. He has prior authorizations for debridement. He denies any issues in the past 2 weeks with his wound care. He has been using Hydrofera Blue to all the wounds. He does report [name] circular rash to the upper left leg that is new. He denies acute signs of infection. 6/6; 2-week follow-up. The patient has open wounds on the left buttock which are worse than the last time I saw this about [name] month ago.  He also has [name] new area to me on the left anterior mid tibia with some surrounding erythema. The area on the dorsal ankle on the right is closed but I think this will be [name] friction injury every time this area is exposed to either our wraps or his compression stockings caused by unrelenting spasms in this leg. 6/20; 2-week follow-up. oo The patient has open wounds on the left buttock which is about the same. Using [facility] here. - The left mid tibia has [name] static amount of surrounding erythema. Also [name] raised area in the center. We have been using Hydrofera Blue here. ooooFinally he has broken down in his dorsal right foot extending between the first and second toes and going to the base of the first and second toe webspace. I have previously assumed that this was severe venous hypertension 7/5; 2-week follow-up oo The left buttock wound actually looks better. We are using Hydrofera Blue. He has extensive skin irritation around this area and I have not really been able to get that any better. I have tried Lotrisone i.e. antifungals and steroids. More most recently we have just been using Coloplast really looks about the same. oo The left mid tibia which was new last week culture to have very resistant staph aureus. Not only methicillin-resistant but doxycycline resistant. The patient has [name] plethora of antibiotic allergies including sulfa, linezolid. I used topical bacitracin on this but he has not started this yet. oo In addition he has an expanding area of erythema with [name] wound on the dorsal right foot. I did [name] deep tissue culture of this area today Objective Constitutional Sitting or standing Blood Pressure is within target range for patient.. Pulse regular and within target range for patient.Marland Kitchen Respirations regular, non-labored and within target range.. Temperature is normal and within the target range for the patient.Marland Kitchen Appears in no distress. Vitals Time Taken: 10:01 AM, Height: 70 in,  Weight: 216 lbs, BMI: 31, Temperature: 98.4 F, Pulse: 125 bpm, Respiratory Rate: 18 breaths/min, Blood Pressure: 127/65 mmHg. General Notes: Wound exam oo Right dorsal ankle remains closed. oo The right dorsal foot has the original wound from last time and [name] large area of erythema. This does not have the appearance of [name] typical bacterial cellulitis he has been using Lotrisone on this without effect. I did [name] deep tissue culture for PCR using [name] #5 curette oo Left anterior mid tibia has improvement in the erythema however necrotic debris on the surface I removed with [name] curette. oo He has an open area on the toe dorsally at the tip again requiring debridement. Integumentary (Hair, Skin) Wound #41R status is Open. Original cause of wound was Gradually Appeared.  The date acquired was: 03/16/2020. The wound has been in treatment 62 weeks. The wound is located on the Left Ischium. The wound measures 1.2cm length x 1.3cm width x 0.1cm depth; 1.225cm^2 area and 0.123cm^3 volume. There is Fat Layer (Subcutaneous Tissue) exposed. There is no tunneling or undermining noted. There is [name] medium amount of serosanguineous drainage noted. The wound margin is flat and intact. There is large (67-100%) red, pink, friable granulation within the wound bed. There is no necrotic tissue within the wound bed. Wound #51 status is Open. Original cause of wound was Trauma. The date acquired was: 01/26/2021. The wound has been in treatment 14 weeks. The wound is located on the Left,Anterior Lower Leg. The wound measures 0.7cm length x 0.6cm width x 0.1cm depth; 0.33cm^2 area and 0.033cm^3 volume. There is Fat Layer (Subcutaneous Tissue) exposed. There is no tunneling or undermining noted. There is [name] medium amount of serosanguineous drainage noted. The wound margin is flat and intact. There is large (67-100%) red, pink granulation within the wound bed. There is [name] small (1-33%) amount of necrotic tissue within the wound bed  including Adherent Slough. Wound #52 status is Open. Original cause of wound was Gradually Appeared. The date acquired was: 03/27/2021. The wound has been in treatment 8 weeks. The wound is located on the Right,Dorsal Foot. The wound measures 1.5cm length x 5cm width x 0.1cm depth; 5.89cm^2 area and 0.589cm^3 volume. There is Fat Layer (Subcutaneous Tissue) exposed. There is no tunneling or undermining noted. There is [name] medium amount of serosanguineous drainage noted. The wound margin is distinct with the outline attached to the wound base. There is medium (34-66%) pink granulation within the wound bed. There is [name] medium (34-66%) amount of necrotic tissue within the wound bed including Adherent Slough. Wound #53 status is Open. Original cause of wound was Gradually Appeared. The date acquired was: 05/11/2021. The wound has been in treatment 2 weeks. The wound is located on the Right,Plantar T Second. The wound measures 1.5cm length x 1cm width x 0.1cm depth; 1.178cm^2 area and 0.118cm^3 volume. oe There is Fat Layer (Subcutaneous Tissue) exposed. There is no tunneling or undermining noted. There is [name] medium amount of serosanguineous drainage noted. The wound margin is flat and intact. There is medium (34-66%) pink granulation within the wound bed. There is [name] medium (34-66%) amount of necrotic tissue within the wound bed including Adherent Slough. Wound #54 status is Open. Original cause of wound was Pressure Injury. The date acquired was: 05/11/2021. The wound has been in treatment 2 weeks. The wound is located on the Left,Distal Ischium. The wound measures 2cm length x 3.5cm width x 0.1cm depth; 5.498cm^2 area and 0.55cm^3 volume. There is Fat Layer (Subcutaneous Tissue) exposed. There is no tunneling or undermining noted. There is [name] medium amount of serosanguineous drainage noted. The wound margin is flat and intact. There is large (67-100%) red, friable granulation within the wound bed. There is no  necrotic tissue within the wound bed. Wound #55 status is Open. Original cause of wound was Blister. The date acquired was: 05/26/2021. The wound is located on the Left T Great. The wound oe measures 0.5cm length x 2cm width x 0.1cm depth; 0.785cm^2 area and 0.079cm^3 volume. There is Fat Layer (Subcutaneous Tissue) exposed. There is no tunneling or undermining noted. There is [name] medium amount of serosanguineous drainage noted. The wound margin is flat and intact. There is large (67-100%) red, pink granulation within the wound bed. There is [name] small (  1-33%) amount of necrotic tissue within the wound bed including Adherent Slough. 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 There was [name] Excisional Skin/Subcutaneous Tissue Debridement with [name] total area of 0.42 sq cm performed by [name] Dillon., [name] With the following instrument(s): Curette, Forceps, and Scissors Material removed includes Subcutaneous Tissue. No specimens were taken. [name] time out was conducted at 10:53, prior to the start of the procedure. [name] Minimum amount of bleeding was controlled with Pressure. The procedure was tolerated well. Post Debridement Measurements: 0.7cm length x 0.6cm width x 0.1cm depth; 0.033cm^3 volume. Character of Wound/Ulcer Post Debridement is stable. Post procedure Diagnosis Wound #51: Same as Pre-Procedure Wound #52 Pre-procedure diagnosis of Wound #52 is [name] Trauma, Other located on the Right,Dorsal Foot . There was [name] Excisional Skin/Subcutaneous Tissue Debridement with [name] total area  of 7.5 sq cm performed by [name] Dillon., [name] With the following instrument(s): Curette, Forceps, and Scissors to remove Non-Viable tissue/material. Material removed includes Subcutaneous Tissue. 1 specimen was taken by [name] Tissue Culture and sent to the lab per facility protocol. [name] time out was conducted at 10:53, prior to the start of the procedure. [name] Minimum amount of bleeding was controlled with Pressure. The procedure was tolerated well. Post Debridement Measurements: 1.5cm length x 5cm width x 0.1cm depth; 0.589cm^3 volume. Character of Wound/Ulcer Post Debridement is stable. 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 There was [name] Excisional Skin/Subcutaneous Tissue oe Debridement with [name] total area of 1 sq cm performed by [name] Dillon., [name] With the following instrument(s): Curette, Forceps, and Scissors Material removed includes Subcutaneous Tissue and Skin: Dermis and. No specimens were taken. [name] time out was conducted at 10:53, prior to the start of the procedure. [name] Minimum amount of bleeding was controlled with Pressure. The procedure was tolerated well. Post Debridement Measurements: 0.5cm length x 2cm width x 0.1cm depth; 0.079cm^3 volume. Character of Wound/Ulcer Post Debridement is stable. Post procedure Diagnosis Wound #55: Same as Pre-Procedure Plan Follow-up Appointments: Return Appointment in 1 week. [facility], Wed, or Thurs with Dr. Dellia Nims Bathing/ Shower/ Hygiene: May shower and wash wound with soap and water. - on days that dressing is changed Edema Control - Lymphedema / SCD / Other: Elevate legs to the level of the heart or above for 30 minutes daily and/or when sitting, [name] frequency of: - throughout the day Compression stocking or Garment 30-40 mm/Hg pressure to: - Juxtalite to both legs daily Off-Loading: Roho cushion for wheelchair Turn  and reposition every 2 hours Laboratory ordered were: Anerobic culture - PCR Culture of Right Dorsal Foot WOUND #41R: - Ischium Wound Laterality: Left Cleanser: Soap and Water Every Other Day/30 Days Discharge Instructions: May shower and wash wound with dial antibacterial soap and water prior to dressing change. Peri-Wound Care: Triamcinolone 15 (g) Every Other Day/30 Days Discharge Instructions: Use triamcinolone 15 (g) mixed with zinc oxide Peri-Wound Care: Zinc Oxide Ointment 30g tube Every Other Day/30 Days Discharge Instructions: Apply Zinc Oxide mixed with Triamcinolone  to periwound with each dressing change Prim Dressing: Hydrofera Blue Ready Foam, 2.5 x2.5 in Every Other Day/30 Days ary Discharge Instructions: Apply to wound bed as instructed Secondary Dressing: ComfortFoam Border, 4x4 in (silicone border) Every Other Day/30 Days Discharge Instructions: Apply over primary dressing as directed. WOUND #51: - Lower Leg Wound Laterality: Left, Anterior Cleanser: Soap and Water Every Other Day/30 Days Discharge Instructions: May shower and wash wound with dial antibacterial soap and water prior to dressing change. Topical: Bacitracin Every Other Day/30 Days Prim Dressing: KerraCel Ag Gelling Fiber Dressing, 2x2 in (silver alginate) Every Other Day/30 Days ary Discharge Instructions: Apply silver alginate to wound bed as instructed Secondary Dressing: ComfortFoam Border, 4x4 in (silicone border) Every Other Day/30 Days Discharge Instructions: Apply over primary dressing as directed. WOUND #52: - Foot Wound Laterality: Dorsal, Right Cleanser: Soap and Water Every Other Day/30 Days Discharge Instructions: May shower and wash wound with dial antibacterial soap and water prior to dressing change. Prim Dressing: KerraCel Ag Gelling Fiber Dressing, 2x2 in (silver alginate) Every Other Day/30 Days ary Discharge Instructions: Apply silver alginate to wound bed as instructed Secondary  Dressing: Woven Gauze Sponge, Non-Sterile 4x4 in Every Other Day/30 Days Discharge Instructions: Apply over primary dressing as directed. Secondary Dressing: Optifoam Non-Adhesive Dressing, 4x4 in Every Other Day/30 Days Discharge Instructions: Apply foam to dorsal ankle for protection Secured With: Kerlix Roll Sterile, 4.5x3.1 (in/yd) Every Other Day/30 Days Discharge Instructions: Secure with Kerlix as directed. Secured With: 56M Medipore H Soft Cloth Surgical T ape, 2x2 (in/yd) Every Other Day/30 Days Discharge Instructions: Secure dressing with tape as directed. WOUND #53: - T Second Wound Laterality: Plantar, Right oe Cleanser: Soap and Water Every Other Day/30 Days Discharge Instructions: May shower and wash wound with dial antibacterial soap and water prior to dressing change. Prim Dressing: KerraCel Ag Gelling Fiber Dressing, 2x2 in (silver alginate) Every Other Day/30 Days ary Discharge Instructions: Apply silver alginate to wound bed as instructed Secondary Dressing: Woven Gauze Sponge, Non-Sterile 4x4 in Every Other Day/30 Days Discharge Instructions: Apply over primary dressing as directed. Secondary Dressing: Optifoam Non-Adhesive Dressing, 4x4 in Every Other Day/30 Days Discharge Instructions: Apply foam to dorsal ankle for protection Secured With: Kerlix Roll Sterile, 4.5x3.1 (in/yd) Every Other Day/30 Days Discharge Instructions: Secure with Kerlix as directed. Secured With: 56M Medipore H Soft Cloth Surgical T ape, 2x2 (in/yd) Every Other Day/30 Days Discharge Instructions: Secure dressing with tape as directed. WOUND #54: - Ischium Wound Laterality: Left, Distal Cleanser: Soap and Water Every Other Day/30 Days Discharge Instructions: May shower and wash wound with dial antibacterial soap and water prior to dressing change. Peri-Wound Care: Triamcinolone 15 (g) Every Other Day/30 Days Discharge Instructions: Use triamcinolone 15 (g) mixed with zinc oxide Peri-Wound Care:  Zinc Oxide Ointment 30g tube Every Other Day/30 Days Discharge Instructions: Apply Zinc Oxide mixed with Triamcinolone to periwound with each dressing change Prim Dressing: Hydrofera Blue Ready Foam, 2.5 x2.5 in Every Other Day/30 Days ary Discharge Instructions: Apply to wound bed as instructed Secondary Dressing: ComfortFoam Border, 4x4 in (silicone border) Every Other Day/30 Days Discharge Instructions: Apply over primary dressing as directed. 1. We are using silver alginate to the left anterior tibia of the left great toe on the right foot. 2. Await deep tissue culture of the right foot I think he is going to require systemic antibiotics especially if this is the same methicillin staph aureus as he grew out of the left tibial area this will be [name] very  difficult antibiotic choice. 3. We are using Hydrofera Blue to his left gluteal areas which look somewhat better Electronic Signature(s) Signed: 05/27/2021 1:36:13 PM By: Linton Ham [name] Entered By: Linton Ham on 05/26/2021 12:47:03 -------------------------------------------------------------------------------- SuperBill Details Patient Name: Date of Service: [name], [name] 05/26/2021 Medical Record Number: [id number] Patient Account Number: [id number] Date of Birth/Sex: Treating RN: 1986-06-30 (35 y.o. [name] Primary Care Provider: [place], [place] Other Clinician: Referring Provider: Treating Provider/Extender: [name]: 281 Diagnosis Coding ICD-10 Codes Code Description I87.332 Chronic venous hypertension (idiopathic) with ulcer and inflammation of left lower extremity L97.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, 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, complete L97.821 Non-pressure chronic ulcer of other part of left lower leg limited to breakdown of skin Facility Procedures CPT4 Code:  [id number] Description: 11042 - DEB SUBQ TISSUE 20 SQ CM/< ICD-10 Diagnosis Description L97.511 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

## 2021-05-29 NOTE — Progress Notes (Signed)
Kovalcik, KALIB BHAGAT ([id number]) Visit Report for 05/26/2021 Arrival Information Details Patient Name: Date of Service: Rueckert, [name]. 05/26/2021 9:45 A M Medical Record Number: [id number] Patient Account Number: [id number] Date of Birth/Sex: Treating RN: [birth year] (35 y.o. [name] Primary Care Traye Bates: [name], [name] Other Clinician: Referring Garland Smouse: Treating Adalida Garver/Extender: [name]: 26 Visit Information History Since Last Visit Added or deleted any medications: No Patient Arrived: Wheel Chair Any new allergies or adverse reactions: No Arrival Time: 10:01 Had a fall or experienced change in No Accompanied By: self activities of daily living that may affect Transfer Assistance: Manual risk of falls: Patient Identification Verified: Yes Signs or symptoms of abuse/neglect since last visito No Secondary Verification Process Completed: Yes Hospitalized since last visit: No Patient Requires Transmission-Based Precautions: No Implantable device outside of the clinic excluding No Patient Has Alerts: Yes cellular tissue based products placed in the center Patient Alerts: R ABI = 1.0 since last visit: L ABI = 1.1 Has Dressing in Place as Prescribed: Yes Pain Present Now: No Electronic Signature(s) Signed: 05/26/2021 3:40:40 PM By: Sandre Kitty Entered By: Sandre Kitty on 05/26/2021 10:01:39 -------------------------------------------------------------------------------- Encounter Discharge Information Details Patient Name: Date of Service: Kussman, [name] 05/26/2021 9:45 A M Medical Record Number: [id number] Patient Account Number: [id number] Date of Birth/Sex: Treating RN: 05/23/1986 (35 y.o. Gilman Buttner Primary Care Jeanae Whitmill: [place], [place] Other Clinician: Referring Kischa Altice: Treating Zahara Rembert/Extender: [name]: 281 Encounter Discharge Information Items Post Procedure  Vitals Discharge Condition: Stable Temperature (F): 98.4 Ambulatory Status: Wheelchair Pulse (bpm): 125 Discharge Destination: Home Respiratory Rate (breaths/min): 18 Transportation: Private Auto Blood Pressure (mmHg): 127/64 Accompanied By: alone Schedule Follow-up Appointment: No Clinical Summary of Care: Patient Declined Electronic Signature(s) Signed: 05/26/2021 6:17:43 PM By: Leane Call Entered By: Leane Call on 05/26/2021 11:37:17 -------------------------------------------------------------------------------- Lower Extremity Assessment Details Patient Name: Date of Service: Denker, [name] 05/26/2021 9:45 A M Medical Record Number: [id number] Patient Account Number: [id number] Date of Birth/Sex: Treating RN: 07/01/1986 (35 y.o. [name] Primary Care Shadia Larose: [place], [place] Other Clinician: Referring Timmy Cleverly: Treating Deavion Dobbs/Extender: [name]: 281 Edema Assessment Assessed: [Left: No] [Right: No] Edema: [Left: Yes] [Right: Yes] Calf Left: Right: Point of Measurement: 33 cm From Medial Instep 27 cm 35.2 cm Ankle Left: Right: Point of Measurement: 10 cm From Medial Instep 23.5 cm 23.5 cm Vascular Assessment Pulses: Dorsalis Pedis Palpable: [Left:Yes] [Right:Yes] Electronic Signature(s) Signed: 05/26/2021 7:10:28 PM By: Levan Hurst RN, BSN Entered By: Levan Hurst on 05/26/2021 10:34:39 -------------------------------------------------------------------------------- Multi Wound Chart Details Patient Name: Date of Service: Schermer, [name] 05/26/2021 9:45 A M Medical Record Number: [id number] Patient Account Number: [id number] Date of Birth/Sex: Treating RN: 07-Aug-1986 (35 y.o. [name] Primary Care Dontarius Sheley: [name], [name] Other Clinician: Referring Keyshon Stein: Treating Circe Chilton/Extender: Dutch Gray, [name]: 281 Vital Signs Height(in): 70 Pulse(bpm):  125 Weight(lbs): 216 Blood Pressure(mmHg): 127/65 Body Mass Index(BMI): 31 Temperature(F): 98.4 Respiratory Rate(breaths/min): 18 Photos: [41R:No Photos Left Ischium] [51:No Photos Left, Anterior Lower Leg] [52:No Photos Right, Dorsal Foot] Wound Location: [41R:Gradually Appeared] [51:Trauma] [52:Gradually Appeared] Wounding Event: [41R:Pressure Ulcer] [51:Trauma, Other] [52:Trauma, Other] Primary Etiology: [41R:Sleep Apnea, Hypertension, Paraplegia Sleep Apnea, Hypertension, Paraplegia Sleep Apnea, Hypertension, Paraplegia] Comorbid History: [41R:03/16/2020] [51:01/26/2021] [52:03/27/2021] Date Acquired: [41R:62] [51:14] [52:8] Weeks of Treatment: [41R:Open] [51:Open] [52:Open] Wound Status: [41R:Yes] [51:No] [52:No] Wound Recurrence: [41R:Yes] [51:No] [52:No] Clustered Wound: [41R:4] [51:N/A] [52:N/A] Clustered Quantity: [41R:1.2x1.3x0.1] [51:0.7x0.6x0.1] [  52:1.5x5x0.1] Measurements L x W x D (cm) [41R:1.225] [51:0.33] [52:5.89] A (cm) : rea [41R:0.123] [51:0.033] [52:0.589] Volume (cm) : [41R:32.20%] [51:0.00%] [52:-212.50%] % Reduction in Area: [41R:32.00%] [51:0.00%] [52:-213.30%] % Reduction in Volume: [41R:Category/Stage II] [51:Full Thickness Without Exposed] [52:Full Thickness Without Exposed] Classification: [41R:Medium] [51:Support Structures Medium] [52:Support Structures Medium] Exudate A mount: [41R:Serosanguineous] [51:Serosanguineous] [52:Serosanguineous] Exudate Type: [41R:red, brown] [51:red, brown] [52:red, brown] Exudate Color: [41R:Flat and Intact] [51:Flat and Intact] [52:Distinct, outline attached] Wound Margin: [41R:Large (67-100%)] [51:Large (67-100%)] [52:Medium (34-66%)] Granulation A mount: [41R:Red, Pink, Friable] [51:Red, Pink] [52:Pink] Granulation Quality: [41R:None Present (0%)] [51:Small (1-33%)] [52:Medium (34-66%)] Necrotic A mount: [41R:Fat Layer (Subcutaneous Tissue): Yes Fat Layer (Subcutaneous Tissue): Yes Fat Layer (Subcutaneous Tissue):  Yes] Exposed Structures: [41R:Fascia: No Tendon: No Muscle: No Joint: No Bone: No Small (1-33%)] [51:Fascia: No Tendon: No Muscle: No Joint: No Bone: No Small (1-33%)] [52:Fascia: No Tendon: No Muscle: No Joint: No Bone: No None] Epithelialization: [41R:N/A] [51:Debridement - Excisional] [52:Debridement - Excisional] Debridement: Pre-procedure Verification/Time Out N/A [51:10:53] [52:10:53] Taken: [41R:N/A] [51:Subcutaneous] [52:Subcutaneous] Tissue Debrided: [41R:N/A] [51:Skin/Subcutaneous Tissue] [52:Skin/Subcutaneous Tissue] Level: [41R:N/A] [51:0.42] [52:7.5] Debridement A (sq cm): [41R:rea N/A] [51:Curette, Forceps, Scissors] [52:Curette, Forceps, Scissors] Instrument: [41R:N/A] [51:Minimum] [52:Minimum] Bleeding: [41R:N/A] [51:Pressure] [52:Pressure] Hemostasis A chieved: [41R:N/A] [51:Procedure was tolerated well] [52:Procedure was tolerated well] Debridement Treatment Response: [41R:N/A] [51:0.7x0.6x0.1] [52:1.5x5x0.1] Post Debridement Measurements L x W x D (cm) [41R:N/A] [51:0.033] [52:0.589] Post Debridement Volume: (cm) [41R:N/A] [51:Debridement] [52:Debridement] Wound Number: [id number] Photos: No Photos No Photos No Photos Right, Plantar T Second oe Left, Distal Ischium Left T Great oe Wound Location: Gradually Appeared Pressure Injury Blister Wounding 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 Primary Etiology: Sleep Apnea, Hypertension, Paraplegia Sleep Apnea, Hypertension, Paraplegia Sleep Apnea, Hypertension, Paraplegia Comorbid History: 05/11/2021 05/11/2021 05/26/2021 Date Acquired: 2 2 0 Weeks of Treatment: Open Open Open Wound Status: No No No Wound Recurrence: No No No Clustered Wound: N/A N/A N/A Clustered Quantity: 1.5x1x0.1 2x3.5x0.1 0.5x2x0.1 Measurements L x W x D (cm) 1.178 5.498 0.785 A (cm) : rea 0.118 0.55 0.079 Volume (cm) : 0.00% -288.80% 0.00% % Reduction in A rea: 0.00% -290.10% 0.00% % Reduction in Volume: Full  Thickness Without Exposed Category/Stage III Full Thickness Without Exposed Classification: Support Structures Support Structures Medium Medium Medium Exudate A mount: Serosanguineous Serosanguineous Serosanguineous Exudate Type: red, brown red, brown red, brown Exudate Color: Flat and Intact Flat and Intact Flat and Intact Wound Margin: Medium (34-66%) Large (67-100%) Large (67-100%) Granulation A mount: Pink Red, Friable Red, Pink Granulation Quality: Medium (34-66%) None Present (0%) Small (1-33%) Necrotic A mount: Fat Layer (Subcutaneous Tissue): Yes Fat Layer (Subcutaneous Tissue): Yes Fat Layer (Subcutaneous Tissue): Yes Exposed Structures: Fascia: No Fascia: No Fascia: No Tendon: No Tendon: No Tendon: No Muscle: No Muscle: No Muscle: No Joint: No Joint: No Joint: No Bone: No Bone: No Bone: No Medium (34-66%) Small (1-33%) None Epithelialization: N/A N/A Debridement - Excisional Debridement: Pre-procedure Verification/Time Out N/A N/A 10:53 Taken: N/A N/A Subcutaneous Tissue Debrided: N/A N/A Skin/Subcutaneous Tissue Level: N/A N/A 1 Debridement A (sq cm): rea N/A N/A Curette, Forceps, Scissors Instrument: N/A N/A Minimum Bleeding: N/A N/A Pressure Hemostasis A chieved: N/A N/A Procedure was tolerated well Debridement Treatment Response: N/A N/A 0.5x2x0.1 Post Debridement Measurements L x W x D (cm) N/A N/A 0.079 Post Debridement Volume: (cm) N/A N/A Debridement Procedures Performed: Treatment Notes Wound #41R (Ischium) Wound Laterality: Left Cleanser Soap and Water Discharge Instruction: May shower and wash wound with dial antibacterial soap and  water prior to dressing change. Peri-Wound Care Triamcinolone 15 (g) Discharge Instruction: Use triamcinolone 15 (g) mixed with zinc oxide Zinc Oxide Ointment 30g tube Discharge Instruction: Apply Zinc Oxide mixed with Triamcinolone to periwound with each dressing change Topical Primary  Dressing Hydrofera Blue Ready Foam, 2.5 x2.5 in Discharge Instruction: Apply to wound bed as instructed Secondary Dressing ComfortFoam Border, 4x4 in (silicone border) Discharge Instruction: Apply over primary dressing as directed. Secured With Compression Wrap Compression Stockings Add-Ons Wound #51 (Lower Leg) Wound Laterality: Left, Anterior Cleanser Soap and Water Discharge Instruction: May shower and wash wound with dial antibacterial soap and water prior to dressing change. Peri-Wound Care Topical Bacitracin Primary Dressing KerraCel Ag Gelling Fiber Dressing, 2x2 in (silver alginate) Discharge Instruction: Apply silver alginate to wound bed as instructed Secondary Dressing ComfortFoam Border, 4x4 in (silicone border) Discharge Instruction: Apply over primary dressing as directed. Secured With Compression Wrap Compression Stockings Add-Ons Wound #52 (Foot) Wound Laterality: Dorsal, Right Cleanser Soap and Water Discharge Instruction: May shower and wash wound with dial antibacterial soap and water prior to dressing change. Peri-Wound Care Topical Primary Dressing KerraCel Ag Gelling Fiber Dressing, 2x2 in (silver alginate) Discharge Instruction: Apply silver alginate to wound bed as instructed Secondary Dressing Woven Gauze Sponge, Non-Sterile 4x4 in Discharge Instruction: Apply over primary dressing as directed. Optifoam Non-Adhesive Dressing, 4x4 in Discharge Instruction: Apply foam to dorsal ankle for protection Secured With Kerlix Roll Sterile, 4.5x3.1 (in/yd) Discharge Instruction: Secure with Kerlix as directed. 40M Medipore H Soft Cloth Surgical T ape, 2x2 (in/yd) Discharge Instruction: Secure dressing with tape as directed. Compression Wrap Compression Stockings Add-Ons Wound #53 (Toe Second) Wound Laterality: Plantar, Right Cleanser Soap and Water Discharge Instruction: May shower and wash wound with dial antibacterial soap and water prior to dressing  change. Peri-Wound Care Topical Primary Dressing KerraCel Ag Gelling Fiber Dressing, 2x2 in (silver alginate) Discharge Instruction: Apply silver alginate to wound bed as instructed Secondary Dressing Woven Gauze Sponge, Non-Sterile 4x4 in Discharge Instruction: Apply over primary dressing as directed. Optifoam Non-Adhesive Dressing, 4x4 in Discharge Instruction: Apply foam to dorsal ankle for protection Secured With Kerlix Roll Sterile, 4.5x3.1 (in/yd) Discharge Instruction: Secure with Kerlix as directed. 40M Medipore H Soft Cloth Surgical T ape, 2x2 (in/yd) Discharge Instruction: Secure dressing with tape as directed. Compression Wrap Compression Stockings Add-Ons Wound #54 (Ischium) Wound Laterality: Left, Distal Cleanser Soap and Water Discharge Instruction: May shower and wash wound with dial antibacterial soap and water prior to dressing change. Peri-Wound Care Triamcinolone 15 (g) Discharge Instruction: Use triamcinolone 15 (g) mixed with zinc oxide Zinc Oxide Ointment 30g tube Discharge Instruction: Apply Zinc Oxide mixed with Triamcinolone to periwound with each dressing change Topical Primary Dressing Hydrofera Blue Ready Foam, 2.5 x2.5 in Discharge Instruction: Apply to wound bed as instructed Secondary Dressing ComfortFoam Border, 4x4 in (silicone border) Discharge Instruction: Apply over primary dressing as directed. Secured With Compression Wrap Compression Stockings Add-Ons Wound #55 (Toe Great) Wound Laterality: Left Cleanser Peri-Wound Care Topical Primary Dressing Secondary Dressing Secured With Compression Wrap Compression Stockings Add-Ons Electronic Signature(s) Signed: 05/27/2021 1:36:13 PM By: Linton Ham MD Signed: 05/29/2021 5:38:08 PM By: Rhae Hammock RN Entered By: Linton Ham on 05/26/2021 12:39:34 -------------------------------------------------------------------------------- Multi-Disciplinary Care Plan Details Patient  Name: Date of Service: Goetzke, [name] 05/26/2021 9:45 A M Medical Record Number: [id number] Patient Account Number: [id number] Date of Birth/Sex: Treating RN: 08-Feb-1986 (35 y.o. [name] Primary Care Lilia Letterman: [place], [place] Other Clinician: Referring Demetrion Wesby: Treating Cheris Tweten/Extender: Dutch Gray,  [name]: [place] reviewed with physician Active Inactive Wound/Skin Impairment Nursing Diagnoses: Impaired tissue integrity Knowledge deficit related to ulceration/compromised skin integrity Goals: Patient/caregiver will verbalize understanding of skin care regimen Date Initiated: 01/05/2016 Target Resolution Date: 06/05/2021 Goal Status: Active Ulcer/skin breakdown will have a volume reduction of 30% by week 4 Date Initiated: 01/05/2016 Date Inactivated: 12/22/2017 Target Resolution Date: 01/19/2018 Unmet Reason: complex wounds, Goal Status: Unmet infection Interventions: Assess patient/caregiver ability to obtain necessary supplies Assess ulceration(s) every visit Provide education on ulcer and skin care Notes: 02/02/21: Complex Care, ongoing. Electronic Signature(s) Signed: 05/26/2021 10:53:25 AM By: Lorrin Jackson Entered By: Lorrin Jackson on 05/26/2021 10:53:24 -------------------------------------------------------------------------------- Pain Assessment Details Patient Name: Date of Service: Puchalski, [name] 05/26/2021 9:45 A M Medical Record Number: [id number] Patient Account Number: [id number] Date of Birth/Sex: Treating RN: 1986/03/07 (35 y.o. [name] Primary Care Murvin Gift: [place], [place] Other Clinician: Referring Yasheka Fossett: Treating Binta Statzer/Extender: [name]: 281 Active Problems Location of Pain Severity and Description of Pain Patient Has Paino No Site Locations Pain Management and Medication Current Pain Management: Electronic Signature(s) Signed:  05/26/2021 3:40:40 PM By: Sandre Kitty Signed: 05/29/2021 5:38:08 PM By: Rhae Hammock RN Entered By: Sandre Kitty on 05/26/2021 10:02:05 -------------------------------------------------------------------------------- Patient/Caregiver Education Details Patient Name: Date of Service: Yanes, [name] 7/5/2022andnbsp9:45 A M Medical Record Number: [id number] Patient Account Number: [id number] Date of Birth/Gender: Treating RN: 16-Apr-1986 (35 y.o. [name] Primary Care Physician: Janine Limbo Other Clinician: Referring Physician: Treating Physician/Extender: [name]: 281 Education Assessment Education Provided To: Patient Education Topics Provided Pressure: Methods: Explain/Verbal, Printed Responses: State content correctly Venous: Methods: Explain/Verbal, Printed Responses: State content correctly Wound/Skin Impairment: Methods: Explain/Verbal, Printed Responses: State content correctly Electronic Signature(s) Signed: 05/26/2021 7:08:24 PM By: Lorrin Jackson Entered By: Lorrin Jackson on 05/26/2021 10:53:54 -------------------------------------------------------------------------------- Wound Assessment Details Patient Name: Date of Service: [place], [name] 05/26/2021 9:45 A M Medical Record Number: [id number] Patient Account Number: [id number] Date of Birth/Sex: Treating RN: [birth year] (35 y.o. [name] Primary Care Corynne Scibilia: [name], [name] Other Clinician: Referring Adja Ruff: Treating Shontae Rosiles/Extender: [name]: 281 Wound Status Wound Number: [id number] Primary Etiology: Pressure Ulcer Wound Location: Left Ischium Wound Status: Open Wounding Event: Gradually Appeared Comorbid History: Sleep Apnea, Hypertension, Paraplegia Date Acquired: 03/16/2020 Weeks Of Treatment: 62 Clustered Wound: Yes Photos Wound Measurements Length: (cm) 1.2 Width: (cm) 1.3 Depth: (cm)  0.1 Clustered Quantity: 4 Area: (cm) 1.225 Volume: (cm) 0.123 % Reduction in Area: 32.2% % Reduction in Volume: 32% Epithelialization: Small (1-33%) Tunneling: No Undermining: No Wound Description Classification: Category/Stage II Wound Margin: Flat and Intact Exudate Amount: Medium Exudate Type: Serosanguineous Exudate Color: red, brown Foul Odor After Cleansing: No Slough/Fibrino No Wound Bed Granulation Amount: Large (67-100%) Exposed Structure Granulation Quality: Red, Pink, Friable Fascia Exposed: No Necrotic Amount: None Present (0%) Fat Layer (Subcutaneous Tissue) Exposed: Yes Tendon Exposed: No Muscle Exposed: No Joint Exposed: No Bone Exposed: No Treatment Notes Wound #41R (Ischium) Wound Laterality: Left Cleanser Soap and Water Discharge Instruction: May shower and wash wound with dial antibacterial soap and water prior to dressing change. Peri-Wound Care Triamcinolone 15 (g) Discharge Instruction: Use triamcinolone 15 (g) mixed with zinc oxide Zinc Oxide Ointment 30g tube Discharge Instruction: Apply Zinc Oxide mixed with Triamcinolone to periwound with each dressing change Topical Primary Dressing Hydrofera Blue Ready Foam, 2.5 x2.5 in Discharge Instruction: Apply to wound bed as instructed  Secondary Dressing ComfortFoam Border, 4x4 in (silicone border) Discharge Instruction: Apply over primary dressing as directed. Secured With Compression Wrap Compression Stockings Environmental education officer) Signed: 05/27/2021 5:22:20 PM By: Sandre Kitty Signed: 05/29/2021 5:38:08 PM By: Rhae Hammock RN Previous Signature: 05/26/2021 7:10:28 PM Version By: Levan Hurst RN, BSN Entered By: Sandre Kitty on 05/27/2021 11:17:09 -------------------------------------------------------------------------------- Wound Assessment Details Patient Name: Date of Service: Rahm, [name] 05/26/2021 9:45 A M Medical Record Number: [id number] Patient Account  Number: [id number] Date of Birth/Sex: Treating RN: [birth year] (35 y.o. [name] Primary Care Sumiko Ceasar: [name], [name] Other Clinician: Referring Damaria Vachon: Treating Christe Tellez/Extender: [name]: 281 Wound Status Wound Number: 51 Primary Etiology: Trauma, Other Wound Location: Left, Anterior Lower Leg Wound Status: Open Wounding Event: Trauma Comorbid History: Sleep Apnea, Hypertension, Paraplegia Date Acquired: 01/26/2021 Weeks Of Treatment: 14 Clustered Wound: No Photos Wound Measurements Length: (cm) 0.7 Width: (cm) 0.6 Depth: (cm) 0.1 Area: (cm) 0.33 Volume: (cm) 0.033 % Reduction in Area: 0% % Reduction in Volume: 0% Epithelialization: Small (1-33%) Tunneling: No Undermining: No Wound Description Classification: Full Thickness Without Exposed Support Structures Wound Margin: Flat and Intact Exudate Amount: Medium Exudate Type: Serosanguineous Exudate Color: red, brown Foul Odor After Cleansing: No Slough/Fibrino Yes Wound Bed Granulation Amount: Large (67-100%) Exposed Structure Granulation Quality: Red, Pink Fascia Exposed: No Necrotic Amount: Small (1-33%) Fat Layer (Subcutaneous Tissue) Exposed: Yes Necrotic Quality: Adherent Slough Tendon Exposed: No Muscle Exposed: No Joint Exposed: No Bone Exposed: No Treatment Notes Wound #51 (Lower Leg) Wound Laterality: Left, Anterior Cleanser Soap and Water Discharge Instruction: May shower and wash wound with dial antibacterial soap and water prior to dressing change. Peri-Wound Care Topical Bacitracin Primary Dressing KerraCel Ag Gelling Fiber Dressing, 2x2 in (silver alginate) Discharge Instruction: Apply silver alginate to wound bed as instructed Secondary Dressing ComfortFoam Border, 4x4 in (silicone border) Discharge Instruction: Apply over primary dressing as directed. Secured With Compression Wrap Compression Stockings Sport and exercise psychologist) Signed: 05/27/2021 5:22:20 PM By: Sandre Kitty Signed: 05/29/2021 5:38:08 PM By: Rhae Hammock RN Previous Signature: 05/26/2021 7:10:28 PM Version By: Levan Hurst RN, BSN Entered By: Sandre Kitty on 05/27/2021 11:07:47 -------------------------------------------------------------------------------- Wound Assessment Details Patient Name: Date of Service: Contee, [name] 05/26/2021 9:45 A M Medical Record Number: [id number] Patient Account Number: [id number] Date of Birth/Sex: Treating RN: 13-Jan-1986 (35 y.o. [name] Primary Care Shiven Junious: [name], [name] Other Clinician: Referring Shuree Brossart: Treating Heraclio Seidman/Extender: [name]: 281 Wound Status Wound Number: 52 Primary Etiology: Trauma, Other Wound Location: Right, Dorsal Foot Wound Status: Open Wounding Event: Gradually Appeared Comorbid History: Sleep Apnea, Hypertension, Paraplegia Date Acquired: 03/27/2021 Weeks Of Treatment: 8 Clustered Wound: No Photos Wound Measurements Length: (cm) 1.5 Width: (cm) 5 Depth: (cm) 0.1 Area: (cm) 5.89 Volume: (cm) 0.589 % Reduction in Area: -212.5% % Reduction in Volume: -213.3% Epithelialization: None Tunneling: No Undermining: No Wound Description Classification: Full Thickness Without Exposed Support Structures Wound Margin: Distinct, outline attached Exudate Amount: Medium Exudate Type: Serosanguineous Exudate Color: red, brown Foul Odor After Cleansing: No Slough/Fibrino Yes Wound Bed Granulation Amount: Medium (34-66%) Exposed Structure Granulation Quality: Pink Fascia Exposed: No Necrotic Amount: Medium (34-66%) Fat Layer (Subcutaneous Tissue) Exposed: Yes Necrotic Quality: Adherent Slough Tendon Exposed: No Muscle Exposed: No Joint Exposed: No Bone Exposed: No Treatment Notes Wound #52 (Foot) Wound Laterality: Dorsal, Right Cleanser Soap and Water Discharge Instruction: May shower and wash  wound with dial antibacterial soap and water prior to dressing  change. Peri-Wound Care Topical Primary Dressing KerraCel Ag Gelling Fiber Dressing, 2x2 in (silver alginate) Discharge Instruction: Apply silver alginate to wound bed as instructed Secondary Dressing Woven Gauze Sponge, Non-Sterile 4x4 in Discharge Instruction: Apply over primary dressing as directed. Optifoam Non-Adhesive Dressing, 4x4 in Discharge Instruction: Apply foam to dorsal ankle for protection Secured With Kerlix Roll Sterile, 4.5x3.1 (in/yd) Discharge Instruction: Secure with Kerlix as directed. 59M Medipore H Soft Cloth Surgical T ape, 2x2 (in/yd) Discharge Instruction: Secure dressing with tape as directed. Compression Wrap Compression Stockings Add-Ons Electronic Signature(s) Signed: 05/27/2021 5:22:20 PM By: Sandre Kitty Signed: 05/29/2021 5:38:08 PM By: Rhae Hammock RN Previous Signature: 05/26/2021 7:10:28 PM Version By: Levan Hurst RN, BSN Entered By: Sandre Kitty on 05/27/2021 11:10:28 -------------------------------------------------------------------------------- Wound Assessment Details Patient Name: Date of Service: Bos, [name] 05/26/2021 9:45 A M Medical Record Number: [id number] Patient Account Number: [id number] Date of Birth/Sex: Treating RN: 14-Jul-1986 (35 y.o. [name] Primary Care Auriel Kist: [name], [name] Other Clinician: Referring Benisha Hadaway: Treating Fredrick Geoghegan/Extender: [name]: 281 Wound Status Wound Number: 53 Primary Etiology: Lymphedema Wound Location: Right, Plantar T Second oe Wound Status: Open Wounding Event: Gradually Appeared Comorbid History: Sleep Apnea, Hypertension, Paraplegia Date Acquired: 05/11/2021 Weeks Of Treatment: 2 Clustered Wound: No Photos Wound Measurements Length: (cm) 1.5 Width: (cm) 1 Depth: (cm) 0.1 Area: (cm) 1.178 Volume: (cm) 0.118 % Reduction in Area: 0% % Reduction in  Volume: 0% Epithelialization: Medium (34-66%) Tunneling: No Undermining: No Wound Description Classification: Full Thickness Without Exposed Support Structures Wound Margin: Flat and Intact Exudate Amount: Medium Exudate Type: Serosanguineous Exudate Color: red, brown Foul Odor After Cleansing: No Slough/Fibrino Yes Wound Bed Granulation Amount: Medium (34-66%) Exposed Structure Granulation Quality: Pink Fascia Exposed: No Necrotic Amount: Medium (34-66%) Fat Layer (Subcutaneous Tissue) Exposed: Yes Necrotic Quality: Adherent Slough Tendon Exposed: No Muscle Exposed: No Joint Exposed: No Bone Exposed: No Treatment Notes Wound #53 (Toe Second) Wound Laterality: Plantar, Right Cleanser Soap and Water Discharge Instruction: May shower and wash wound with dial antibacterial soap and water prior to dressing change. Peri-Wound Care Topical Primary Dressing KerraCel Ag Gelling Fiber Dressing, 2x2 in (silver alginate) Discharge Instruction: Apply silver alginate to wound bed as instructed Secondary Dressing Woven Gauze Sponge, Non-Sterile 4x4 in Discharge Instruction: Apply over primary dressing as directed. Optifoam Non-Adhesive Dressing, 4x4 in Discharge Instruction: Apply foam to dorsal ankle for protection Secured With Kerlix Roll Sterile, 4.5x3.1 (in/yd) Discharge Instruction: Secure with Kerlix as directed. 59M Medipore H Soft Cloth Surgical T ape, 2x2 (in/yd) Discharge Instruction: Secure dressing with tape as directed. Compression Wrap Compression Stockings Add-Ons Electronic Signature(s) Signed: 05/27/2021 5:22:20 PM By: Sandre Kitty Signed: 05/29/2021 5:38:08 PM By: Rhae Hammock RN Previous Signature: 05/26/2021 7:10:28 PM Version By: Levan Hurst RN, BSN Entered By: Sandre Kitty on 05/27/2021 11:11:23 -------------------------------------------------------------------------------- Wound Assessment Details Patient Name: Date of Service: Fries, [name]  05/26/2021 9:45 A M Medical Record Number: [id number] Patient Account Number: [id number] Date of Birth/Sex: Treating RN: December 21, 1985 (35 y.o. [name] Primary Care Dekota Shenk: [place], [name] Other Clinician: Referring Jung Yurchak: Treating Avelino Herren/Extender: [name]: 281 Wound Status Wound Number: 54 Primary Etiology: Pressure Ulcer Wound Location: Left, Distal Ischium Wound Status: Open Wounding Event: Pressure Injury Comorbid History: Sleep Apnea, Hypertension, Paraplegia Date Acquired: 05/11/2021 Weeks Of Treatment: 2 Clustered Wound: No Photos Wound Measurements Length: (cm) 2 Width: (cm) 3.5 Depth: (cm) 0.1 Area: (cm) 5.498 Volume: (cm) 0.55 % Reduction in Area: -288.8% %  Reduction in Volume: -290.1% Epithelialization: Small (1-33%) Tunneling: No Undermining: No Wound Description Classification: Category/Stage III Wound Margin: Flat and Intact Exudate Amount: Medium Exudate Type: Serosanguineous Exudate Color: red, brown Foul Odor After Cleansing: No Slough/Fibrino No Wound Bed Granulation Amount: Large (67-100%) Exposed Structure Granulation Quality: Red, Friable Fascia Exposed: No Necrotic Amount: None Present (0%) Fat Layer (Subcutaneous Tissue) Exposed: Yes Tendon Exposed: No Muscle Exposed: No Joint Exposed: No Bone Exposed: No Treatment Notes Wound #54 (Ischium) Wound Laterality: Left, Distal Cleanser Soap and Water Discharge Instruction: May shower and wash wound with dial antibacterial soap and water prior to dressing change. Peri-Wound Care Triamcinolone 15 (g) Discharge Instruction: Use triamcinolone 15 (g) mixed with zinc oxide Zinc Oxide Ointment 30g tube Discharge Instruction: Apply Zinc Oxide mixed with Triamcinolone to periwound with each dressing change Topical Primary Dressing Hydrofera Blue Ready Foam, 2.5 x2.5 in Discharge Instruction: Apply to wound bed as instructed Secondary  Dressing ComfortFoam Border, 4x4 in (silicone border) Discharge Instruction: Apply over primary dressing as directed. Secured With Compression Wrap Compression Stockings Environmental education officer) Signed: 05/27/2021 5:22:20 PM By: Sandre Kitty Signed: 05/29/2021 5:38:08 PM By: Rhae Hammock RN Previous Signature: 05/26/2021 7:10:28 PM Version By: Levan Hurst RN, BSN Entered By: Sandre Kitty on 05/27/2021 11:18:40 -------------------------------------------------------------------------------- Wound Assessment Details Patient Name: Date of Service: Clements, [name] 05/26/2021 9:45 A M Medical Record Number: [id number] Patient Account Number: [id number] Date of Birth/Sex: Treating RN: 1985/12/17 (35 y.o. [name] Primary Care Tifini Reeder: [name], [name] Other Clinician: Referring Vail Vuncannon: Treating Brenan Modesto/Extender: [name]: 281 Wound Status Wound Number: 55 Primary Etiology: Neuropathic Ulcer-Non Diabetic Wound Location: Left T Great oe Wound Status: Open Wounding Event: Blister Comorbid History: Sleep Apnea, Hypertension, Paraplegia Date Acquired: 05/26/2021 Weeks Of Treatment: 0 Clustered Wound: No Photos Wound Measurements Length: (cm) 0.5 Width: (cm) 2 Depth: (cm) 0.1 Area: (cm) 0.785 Volume: (cm) 0.079 % Reduction in Area: 0% % Reduction in Volume: 0% Epithelialization: None Tunneling: No Undermining: No Wound Description Classification: Full Thickness Without Exposed Support Structures Wound Margin: Flat and Intact Exudate Amount: Medium Exudate Type: Serosanguineous Exudate Color: red, brown Foul Odor After Cleansing: No Slough/Fibrino Yes Wound Bed Granulation Amount: Large (67-100%) Exposed Structure Granulation Quality: Red, Pink Fascia Exposed: No Necrotic Amount: Small (1-33%) Fat Layer (Subcutaneous Tissue) Exposed: Yes Necrotic Quality: Adherent Slough Tendon Exposed: No Muscle  Exposed: No Joint Exposed: No Bone Exposed: No Treatment Notes Wound #55 (Toe Great) Wound Laterality: Left Cleanser Peri-Wound Care Topical Primary Dressing Secondary Dressing Secured With Compression Wrap Compression Stockings Add-Ons Electronic Signature(s) Signed: 05/26/2021 5:48:24 PM By: Sandre Kitty Signed: 05/29/2021 5:38:08 PM By: Rhae Hammock RN Entered By: Sandre Kitty on 05/26/2021 17:47:22 -------------------------------------------------------------------------------- Vitals Details Patient Name: Date of Service: [place], [name] 05/26/2021 9:45 A M Medical Record Number: [id number] Patient Account Number: [id number] Date of Birth/Sex: Treating RN: 1986/08/04 (35 y.o. [name] Primary Care Flonnie Wierman: [place], [place] Other Clinician: Referring Lezli Danek: Treating Antonis Lor/Extender: [name]: 281 Vital Signs Time Taken: 10:01 Temperature (F): 98.4 Height (in): 70 Pulse (bpm): 125 Weight (lbs): 216 Respiratory Rate (breaths/min): 18 Body Mass Index (BMI): 31 Blood Pressure (mmHg): 127/65 Reference Range: 80 - 120 mg / dl Electronic Signature(s) Signed: 05/26/2021 3:40:40 PM By: Sandre Kitty Entered By: Sandre Kitty on 05/26/2021 10:01:56

## 2021-06-02 ENCOUNTER — Other Ambulatory Visit: Payer: Self-pay

## 2021-06-02 ENCOUNTER — Encounter (HOSPITAL_BASED_OUTPATIENT_CLINIC_OR_DEPARTMENT_OTHER): Payer: 59 | Admitting: Internal Medicine

## 2021-06-02 DIAGNOSIS — L89323 Pressure ulcer of left buttock, stage 3: Secondary | ICD-10-CM | POA: Diagnosis not present

## 2021-06-03 NOTE — Progress Notes (Signed)
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 him aggressively for this although we have not made any improvement here. 08/11/17; left lateral leg. Surface continues to improve with the Iodoflex although we are not seeing much improvement in overall wound dimensions. Areas on his plantar left foot and right foot show no improvement. In fact the right foot looks somewhat worse 08/18/17; left lateral leg. We changed to [facility] Blue last week after a prolonged course of Iodoflex which helps get the surface better. It appears that the wound with is improved. Continue with difficult areas on the left dorsal first second and plantar first second on the right  09/01/17; patient arrives in clinic today having had a temperature of 103 yesterday. He was seen in the ER and [facility]. The patient was concerned he could have cellulitis again in the right leg however they diagnosed him with a UTI and he is now on Keflex. He has a history of cellulitis which is been recurrent and difficult but this is been in the left leg, in the past 5 use doxycycline. He does in and out catheterizations at home which are risk factors for UTI 09/08/17; patient will be completing his Keflex this weekend. The erythema on the left leg is considerably better. He has a new  wound today on the medial part of the right leg small superficial almost looks like a skin tear. He has worsening of the area on the right dorsal first and second toe. His major area on the left lateral leg is better. Using Hydrofera Blue on all areas 09/15/17; gradual reduction in width on the long wound in the left lateral leg. No debridement required. He also has wounds on the plantar aspect of his left first second toe web space and on the dorsal aspect of the right first second toe web space. 09/22/17; there continues to be very gradual improvements in the dimensions of the left lateral leg wound. He hasn't round erythematous spot with might be pressure on his wheelchair. There is no evidence obviously of infection no purulence no warmth He has a dry scaled area on the plantar aspect of the left first second toe Improved area on the dorsal right first second toe. 09/29/17; left lateral leg wound continues to improve in dimensions mostly with an is still a fairly long but increasingly narrow wound. He has a dry scaled area on the plantar aspect of his left first second toe web space Increasingly concerning area on the dorsal right first second toe. In fact I am concerned today about possible cellulitis around this wound. The areas extending up his second toe and although there is deformities here almost appears to abut on the nailbed. 10/06/17; left lateral leg wound continues to make very gradual progress. Tissue culture I did from the right first second toe dorsal foot last time grew MRSA and enterococcus which was vancomycin sensitive. This was not sensitive to clindamycin or doxycycline. He is allergic to Zyvox and sulfa we have therefore arrange for him to have dalvance infusion tomorrow. He is had this in the past and tolerated it well 10/20/17; left lateral leg wound continues to make decent progress. This is certainly reduced in terms of with there is advancing epithelialization.The  cellulitis in the right foot looks better although he still has a deep wound in the dorsal aspect of the first second toe web space. Plantar left first toe web space on the left I think is making some progress 10/27/17; left lateral leg wound continues to make decent progress. Advancing epithelialization.using Hydrofera Blue The right first second toe web space wound is better-looking using silver alginate Improvement in the left plantar first second toe web space. Again using silver alginate 11/03/17 left lateral leg wound continues to make decent progress albeit slowly. Using [facility] The right per second toe web space continues to be a very problematic looking punched out wound. I obtained a piece of tissue for deep culture I did extensively treated this for fungus. It is difficult to imagine that this is a pressure area as the patient states other than going outside he doesn't really wear shoes at home The  left plantar first second toe web space looked fairly senescent. Necrotic edges. This required debridement change to [facility] Blue to all wound areas 11/10/17; left lateral leg wound continues to contract. Using Hydrofera Blue On the right dorsal first second toe web space dorsally. Culture I did of this area last week grew MRSA there is not an easy oral option in this patient was multiple antibiotic allergies or intolerances. This was only a rare culture isolate I'm therefore going to use Bactroban under silver alginate On the left plantar first second toe web space. Debridement is required here. This is also unchanged 11/17/17; left lateral leg wound continues to contract using Hydrofera Blue this is no longer the major issue. The major concern here is the right first second toe web space. He now has an open area going from dorsally to the plantar aspect. There is now wound on the inner lateral part of the first toe. Not a very viable surface on this. There is erythema spreading  medially into the forefoot. No major change in the left first second toe plantar wound 11/24/17; left lateral leg wound continues to contract using Hydrofera Blue. Nice improvement today The right first second toe web space all of this looks a lot less angry than last week. I have given him clindamycin and topical Bactroban for MRSA and terbinafine for the possibility of underlining tinea pedis that I could not control with ketoconazole. Looks somewhat better The area on the plantar left first second toe web space is weeping with dried debris around the wound 12/01/17; left lateral leg wound continues to contract he Hydrofera Blue. It is becoming thinner in terms of with nevertheless it is making good improvement. The right first second toe web space looks less angry but still a large necrotic-looking wounds starting on the plantar aspect of the right foot extending between the toes and now extensively on the base of the right second toe. I gave him clindamycin and topical Bactroban for MRSA anterior benefiting for the possibility of underlying tinea pedis. Not looking better today The area on the left first/second toe looks better. Debrided of necrotic debris 12/05/17* the patient was worked in urgently today because over the weekend he found blood on his incontinence bad when he woke up. He was found to have an ulcer by his wife who does most of his wound care. He came in today for [place] to look at this. He has not had a history of wounds in his buttocks in spite of his paraplegia. 12/08/17; seen in follow-up today at his usual appointment. He was seen earlier this week and found to have a new wound on his buttock. We also follow him for wounds on the left lateral leg, left first second toe web space and right first second toe web space 12/15/17; we have been using Hydrofera Blue to the left lateral leg which has improved. The right first second toe web space has also improved. Left first second toe web  space plantar aspect looks stable. The left buttock has worsened using Santyl. Apparently the buttock has drainage 12/22/17; we have been using Hydrofera Blue to the left lateral leg which continues to improve now 2 small wounds separated by normal skin. He tells [place] he had a fever up to 100 yesterday he is prone to UTIs but has not noted anything different. He does in and out catheterizations. The area between the first and second toes today does not look good necrotic surface covered with what looks  to be purulent drainage and erythema extending into the third toe. I had gotten this to something that I thought look better last time however it is not look good today. He also has a necrotic surface over the buttock wound which is expanded. I thought there might be infection under here so I removed a lot of the surface with a #5 curet though nothing look like it really needed culturing. He is been using Santyl to this area 12/27/17; his original wound on the left lateral leg continues to improve using Hydrofera Blue. I gave him samples of Baxdella although he was unable to take them out of fear for an allergic reaction ["lump in his throat"].the culture I did of the purulent drainage from his second toe last week showed both enterococcus and a set Enterobacter I was also concerned about the erythema on the bottom of his foot although paradoxically although this looks somewhat better today. 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 We've been using silver alginate here. They came up today that he sleeps in a recliner, I'm not sure why but I asked him to stop this 01/03/18; his original wound we've been using Hydrofera Blue is now separated into 2 areas. Ulcer on his left buttock is better he is off the recliner and sleeping in bed Finally both wound areas between his first and second toes also looks some  better 01/10/18; his original wound on the left lateral leg is now separated into 2 wounds we've been using Hydrofera Blue Ulcer on his left buttock has some drainage. There is a small probing site going into muscle layer superiorly.using silver alginate -He arrives today with a deep tissue injury on the left heel The wound on the dorsal aspect of his first second toe on the left looks a lot betterusing silver alginate ketoconazole The area on the first second toe web space on the right also looks a lot bette 01/17/18; his original wound on the left lateral leg continues to progress using Hydrofera Blue Ulcer on his left buttock also is smaller surface healthier except for a small probing site going into the muscle layer superiorly. 2.4 cm of tunneling in this area DTI on his left heel we have only been offloading. Looks better than last week no threatened open no evidence of infection the wound on the dorsal aspect of the first second toe on the left continues to look like it's regressing we have only been using silver alginate and terbinafine orally The area in the first second toe web space on the right also looks to be a lot better using silver alginate and terbinafine I think this was prompted by tinea pedis 01/31/18; the patient was hospitalized in [place] last week apparently for a complicated UTI. He was discharged on cefepime he does in and out catheterizations. In the hospital he was discovered M I don't mild elevation of AST and ALT and the terbinafine was stopped.predictably the pressure ulcer on s his buttock looks betterusing silver alginate. The area on the left lateral leg also is better using Hydrofera Blue. The area between the first and second toes on the left better. First and second toes on the right still substantial but better. Finally the DTI on the left heel has held together and looks like it's resolving 02/07/18-he is here in follow-up evaluation for multiple ulcerations.  He has new injury to the lateral aspect of  the last issue a pressure ulcer, he states this is from adhesive removal trauma. He states he has tried multiple adhesive products with no success. All other ulcers appear stable. The left heel DTI is resolving. We will continue with same treatment plan and follow-up next week. 02/14/18; follow-up for multiple areas. He has a new area last week on the lateral aspect of his pressure ulcer more over the posterior trochanter. The original pressure ulcer looks quite stable has healthy granulation. 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 Almost fully epithelialized on the original superior area using Hydrofera Blue DTI on the left heel has peeled off this week to reveal a small superficial wound under denuded skin and subcutaneous tissue Both areas between the first and second toes look better including nothing open on the left 02/21/18; The patient's wounds on his left ischial tuberosity and posterior left greater trochanter actually looked better. He has a large area of irritation around the area which I think is contact dermatitis. I am doubtful that this is fungal His original wound on the left lateral calf continues to improve we have been using Hydrofera Blue There is no open area in the left first second toe web space although there is a lot of thick callus The DTI on the left heel required debridement today of necrotic surface eschar and subcutaneous tissue using silver alginate Finally the area on the right first second toe webspace continues to contract using silver alginate and ketoconazole 02/28/18 Left ischial tuberosity wounds look better using silver alginate. Original wound on the left calf only has one small open area left using Hydrofera Blue DTI on the left heel required debridement mostly removing skin from around this wound surface. Using  silver alginate The areas on the right first/second toe web space using silver alginate and ketoconazole 03/08/18 on evaluation today patient appears to be doing decently well as best I can tell in regard to his wounds. This is the first time that I have seen him as he generally is followed by Dr. Dellia Nims. With that being said none of his wounds appear to be infected he does have an area where there is some skin covering what appears to be a new wound on the left dorsal surface of his great toe. This is right at the nail bed. With that being said I do believe that debrided away some of the excess skin can be of benefit in this regard. Otherwise he has been tolerating the dressing changes without complication. 03/14/18; patient arrives today with the multiplicity of wounds that we are following. He has not been systemically unwell Original wound on the left lateral calf now only has 2 small open areas we've been using Hydrofera Blue which should continue The deep tissue injury on the left heel requires debridement today. We've been using silver alginate The left first second toe and the right first second toe are both are reminiscence what I think was tinea pedis. Apparently some of the callus Surface between the toes was removed last week when it started draining. Purulent drainage coming from the wound on the ischial tuberosity on the left. 03/21/18-He is here in follow-up evaluation for multiple wounds. 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 He tolerated debridement. The only change to last week's recommendations is to discontinue antifungal cream between toes. He will follow-up next week  03/28/18; following up for multiple wounds;Concern this week is streaking redness and swelling in the right foot. He is going to need antibiotics for this. 03/31/18; follow-up for right foot cellulitis. Streaking redness and swelling in the  right foot on 03/28/18. 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 I put him on clindamycin 300 mg every 6 and brought him in for a quick check. He has an open wound between his first and second toes on the right foot as a potential source. 04/04/18; Right foot cellulitis is resolving he is completing clindamycin. This is truly good news Left lateral calf wound which is initial wound only has one small open area inferiorly this is close to healing out. He has compression stockings. We will use Hydrofera Blue right down to the epithelialization of this Nonviable surface on the left heel which was initially pressure with a DTI. We've been using Hydrofera Blue. I'm going to switch this back to silver alginate Left first second toe/tinea pedis this looks better using silver alginate Right first second toe tinea pedis using silver alginate Large pressure ulcers on theLeft ischial tuberosity. Small wound here Looks better. I am uncertain about the surface over the large wound. Using silver alginate 04/11/18; Cellulitis in the right foot is resolved Left lateral calf wound which was his original wounds still has 2 tiny open areas remaining this is just about closed Nonviable surface on the left heel is better but still requires debridement Left first second toe/tinea pedis still open using silver alginate Right first second toe wound tinea pedis I asked him to go back to using ketoconazole and silver alginate Large pressure ulcers on the left ischial tuberosity this shear injury here is resolved. Wound is smaller. No evidence of infection using silver alginate 04/18/18; Patient arrives with an intense area of cellulitis in the right mid lower calf extending into the right heel area. Bright red and warm. Smaller area on the left anterior leg. He has a significant history of MRSA. He will definitely need antibioticsdoxycycline He now has 2 open areas on the left ischial tuberosity the  original large wound and now a satellite area which I think was above his initial satellite areas. Not a wonderful surface on this satellite area surrounding erythema which looks like pressure related. His left lateral calf wound again his original wound is just about closed Left heel pressure injury still requiring debridement Left first second toe looks a lot better using silver alginate Right first second toe also using silver alginate and ketoconazole cream also looks better 04/20/18; the patient was worked in early today out of concerns with his cellulitis on the right leg. I had started him on doxycycline. This was 2 days ago. His wife was concerned about the swelling in the area. Also concerned about the left buttock. He has not been systemically unwell no fever chills. No nausea vomiting or diarrhea 04/25/18; the patient's left buttock wound is continued to deteriorate he is using Hydrofera Blue. He is still completing clindamycin for the cellulitis on the right leg although all of this looks better. 05/02/18 Left buttock wound still with a lot of drainage and a very tightly adherent fibrinous necrotic surface. He has a deeper area superiorly The left lateral calf wound is still closed DTI wound on the left heel necrotic surface especially the circumference using Iodoflex Areas between his left first second toe and right first second toe both look better. Dorsally and the right first second toe he  had a necrotic surface although at smaller. In using silver alginate and ketoconazole. I did a culture last week which was a deep tissue culture of the reminiscence of the open wound on the right first second toe dorsally. This grew a few Acinetobacter and a few methicillin-resistant staph aureus. Nevertheless the area actually this week looked better. I didn't feel the need to specifically address this at least in terms of systemic antibiotics. 05/09/18; wounds are measuring larger more drainage per  our intake. We are using Santyl covered with alginate on the large superficial buttock wounds, Iodosorb on the left heel, ketoconazole and silver alginate to the dorsal first and second toes bilaterally. 05/16/18; The area on his left buttock better in some aspects although the area superiorly over the ischial tuberosity required an extensive debridement.using Santyl Left heel appears stable. Using Iodoflex The areas between his first and second toes are not bad however there is spreading erythema up the dorsal aspect of his left foot this looks like cellulitis again. He is insensate the erythema is really very brilliant.o Erysipelas He went to see an allergist days ago because he was itching part of this he had lab work done. This showed a white count of 15.1 with 70% neutrophils. Hemoglobin of 11.4 and a platelet count of 659,000. Last white count we had in Epic was a 2-1/2 years ago which was 25.9 but he was ill at the time. He was able to show me some lab work that was done by his primary physician the pattern is about the same. 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 But prompted me to go ahead and do x-rays of both feet and the pelvis rule out osteomyelitis. He also had a comprehensive metabolic panel this was reasonably normal his albumin was 3.7 liver function tests BUN/creatinine all normal 05/23/18; x-rays of both his feet from last week were negative for underlying pulmonary abnormality. The x-ray of his pelvis however showed mild irregularity in the left ischial which may represent some early osteomyelitis. The wound in the left ischial continues to get deeper clearly now exposed muscle. Each week necrotic surface material over this area. Whereas the rest of the wounds do not look so bad. The left ischial wound we have been using Santyl and calcium alginate T the left heel surface necrotic debris using Iodoflex o The left lateral leg is still  healed Areas on the left dorsal foot and the right dorsal foot are about the same. There is some inflammation on the left which might represent contact dermatitis, fungal dermatitis I am doubtful cellulitis although this looks better than last week 05/30/18; CT scan done at Hospital did not show any osteomyelitis or abscess. Suggested the possibility of underlying cellulitis although I don't see a lot of evidence of this at the bedside The wound itself on the left buttock/upper thigh actually looks somewhat better. No debridement Left heel also looks better no debridement continue Iodoflex Both dorsal first second toe spaces appear better using Lotrisone. Left still required debridement 06/06/18; Intake reported some purulent looking drainage from the left gluteal wound. Using Santyl and calcium alginate Left heel looks better although still a nonviable surface requiring debridement The left dorsal foot first/second webspace actually expanding and somewhat deeper. I may consider doing a shave biopsy of this area Right dorsal foot first/second webspace appears stable to improved. Using Lotrisone and silver alginate to both these areas 06/13/18 Left gluteal surface looks  better. Now separated in the 2 wounds. No debridement required. Still drainage. We'll continue silver alginate Left heel continues to look better with Iodoflex continue this for at least another week Of his dorsal foot wounds the area on the left still has some depth although it looks better than last week. We've been using Lotrisone and silver alginate 06/20/18 Left gluteal continues to look better healthy tissue Left heel continues to look better healthy granulation wound is smaller. He is using Iodoflex and his long as this continues continue the Iodoflex Dorsal right foot looks better unfortunately dorsal left foot does not. There is swelling and erythema of his forefoot. He had minor trauma to this several days ago but doesn't  think this was enough to have caused any tissue injury. Foot looks like cellulitis, we have had this problem before 06/27/18 on evaluation today patient appears to be doing a little worse in regard to his foot ulcer. 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 Both of which would really be his only options for treating this infection. In the past he has been given and effusion of Orbactiv. This is done very well for him in the past again it's one time dosing IV antibiotic therapy. Subsequently I do believe this is something we're gonna need to see about doing at this point in time. Currently his other wounds seem to be doing somewhat better in my pinion I'm pretty happy in that regard. 07/03/18 on evaluation today patient's wounds actually appear to be doing fairly well. He has been tolerating the dressing changes without complication. All in all he seems to be showing signs of improvement. In regard to the antibiotics he has been dealing with infectious disease since I saw him last week as far as getting this scheduled. In the end [facility]'s [facility] this coming Friday. In the meantime he has been continuing to perform the dressing changes in such as previous. There does not appear to be any evidence of infection worsengin at this time. 07/10/18; Since I last saw this man 2 weeks ago things have actually improved. IV antibiotics of resulted in less forefoot erythema although there is still some present. He is not systemically unwell Left buttock wounds 2 now have no depth there is increased epithelialization Using silver alginate Left heel still requires debridement using Iodoflex Left dorsal foot still with a sizable wound about the size of a border but healthy granulation Right dorsal foot still with a  slitlike area using silver alginate 07/18/18; the patient's cellulitis in the left foot is improved in fact I think it is on its way to resolving. Left buttock wounds 2 both look better although the larger one has hypertension granulation we've been using silver alginate Left heel has some thick circumferential redundant skin over the wound edge which will need to be removed today we've been using Iodoflex Left dorsal foot is still a sizable wound required debridement using silver alginate The right dorsal foot is just about closed only a small open area remains here 07/25/18; left foot cellulitis is resolved Left buttock wounds 2 both look better. Hyper-granulation on the major area Left heel as some debris over the surface but otherwise looks a healthier wound. Using silver collagen Right dorsal foot is just about closed 07/31/18; arrives with  our intake nurse worried about purulent drainage from the buttock. We had hyper-granulation here last week His buttock wounds 2 continue to look better Left heel some debris over the surface but measuring smaller. Right dorsal foot unfortunately has openings between the toes Left foot superficial wound looks less aggravated. 08/07/18 Buttock wounds continue to look better although some of her granulation and the larger medial wound. silver alginate Left heel continues to look a lot better.silver collagen Left foot superficial wound looks less stable. Requires debridement. He has a new wound superficial area on the foot on the lateral dorsal foot. Right foot looks better using silver alginate without Lotrisone 08/14/2018; patient was in the ER last week diagnosed with a UTI. He is now on Cefpodoxime and Macrodantin. Buttock wounds continued to be smaller. Using silver alginate Left heel continues to look better using silver collagen Left foot superficial wound looks as though it is improving Right dorsal foot area is just about healed. 08/21/2018; patient is  completed his antibiotics for his UTI. He has 2 open areas on the buttocks. There is still not closed although the surface looks satisfactory. Using silver alginate Left heel continues to improve using silver collagen The bilateral dorsal foot areas which are at the base of his first and second toes/possible tinea pedis are actually stable on the left but worse on the right. The area on the left required debridement of necrotic surface. After debridement I obtained a specimen for PCR culture. The right dorsal foot which is been just about healed last week is now reopened 08/28/2018; culture done on the left dorsal foot showed coag negative staph both staph epidermidis and Lugdunensis. I think this is worthwhile initiating systemic treatment. I will use doxycycline given his long list of allergies. The area on the left heel slightly improved but still requiring debridement. The large wound on the buttock is just about closed whereas the smaller one is larger. Using silver alginate in this area 09/04/2018; patient is completing his doxycycline for the left foot although this continues to be a very difficult wound area with very adherent necrotic debris. We are using silver alginate to all his wounds right foot left foot and the small wounds on his buttock, silver collagen on the left heel. 09/11/2018; once again this patient has intense erythema and swelling of the left forefoot. Lesser degrees of erythema in the right foot. He has a long list of allergies and intolerances. I will reinstitute doxycycline. 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 Using silver alginate Left heel also looks better using silver collagen Unfortunately both the areas on his feet look worse. The area on the left first second webspace is now gone through to the plantar part of his foot. The area on the left foot anteriorly is irritated with erythema and swelling in the  forefoot. 09/25/2018 His wound on the left plantar heel looks better. Using silver collagen The area on the left buttock 2 small remnant areas. One is closed one is still open. Using silver alginate The areas between both his first and second toes look worse. This in spite of long-standing antifungal therapy with ketoconazole and silver alginate which should have antifungal activity He has small areas around his original wound on the left calf one is on the bottom of the original scar tissue and one superiorly both of these are small and superficial but again given wound history in this site this is  worrisome 10/02/2018 Left plantar heel continues to gradually contract using silver collagen Left buttock wound is unchanged using silver alginate The areas on his dorsal feet between his first and second toes bilaterally look about the same. I prescribed clindamycin ointment to see if we can address chronic staph colonization and also the underlying possibility of erythrasma The left lateral lower extremity wound is actually on the lateral part of his ankle. Small open area here. We have been using silver alginate 10/09/2018; Left plantar heel continues to look healthy and contract. No debridement is required Left buttock slightly smaller with a tape injury wound just below which was new this week Dorsal feet somewhat improved I have been using clindamycin Left lateral looks lower extremity the actual open area looks worse although a lot of this is epithelialized. I am going to change to silver collagen today He has a lot more swelling in the right leg although this is not pitting not red and not particularly warm there is a lot of spasm in the right leg usually indicative of people with paralysis of some underlying discomfort. We have reviewed his vascular status from 2017 he had a left greater saphenous vein ablation. I wonder about referring him back to vascular surgery if the area on the left  leg continues to deteriorate. 10/16/2018 in today for follow-up and management of multiple lower extremity ulcers. His left Buttock wound is much lower smaller and almost closed completely. The wound to the left ankle has began to reopen with Epithelialization and some adherent slough. He has multiple new areas to the left foot and leg. The left dorsal foot without much improvement. Wound present between left great webspace and 2nd toe. Erythema and edema present right leg. Right LE ultrasound obtained on 10/10/18 was negative for DVT . 10/23/2018; Left buttock is closed over. Still dry macerated skin but there is no open wound. I suspect this is chronic pressure/moisture Left lateral calf is quite a bit worse than when I saw this last. There is clearly drainage here he has macerated skin into the left plantar heel. We will change the primary dressing to alginate Left dorsal foot has some improvement in overall wound area. Still using clindamycin and silver alginate Right dorsal foot about the same as the left using clindamycin and silver alginate The erythema in the right leg has resolved. He is DVT rule out was negative Left heel pressure area required debridement although the wound is smaller and the surface is health 10/26/2018 The patient came back in for his nurse check today predominantly because of the drainage coming out of the left lateral leg with a recent reopening of his original wound on the left lateral calf. He comes in today with a large amount of surrounding erythema around the wound extending from the calf into the ankle and even in the area on the dorsal foot. He is not systemically unwell. He is not febrile. Nevertheless this looks like cellulitis. We have been using silver alginate to the area. I changed him to a regular visit and I am going to prescribe him doxycycline. The rationale here is a long list of medication intolerances and a history of MRSA. I did not see anything  that I thought would provide a valuable culture 10/30/2018 Follow-up from his appointment 4 days ago with really an extensive area of cellulitis in the left calf left lateral ankle and left dorsal foot. I put him on doxycycline. He has a long list of medication allergies  which are true allergy reactions. 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 In any case he is a lot better today. The erythema especially in the anterior and lateral left calf is better. He still has left ankle erythema. He also is complaining about increasing edema in the right leg we have only been using Kerlix Coban and he has been doing the wraps at home. Finally he has a spotty rash on the medial part of his upper left calf which looks like folliculitis or perhaps wrap occlusion type injury. Small superficial macules not pustules 11/06/18 patient arrives today with again a considerable degree of erythema around the wound on the left lateral calf extending into the dorsal ankle and dorsal foot. This is a lot worse than when I saw this last week. He is on doxycycline really with not a lot of improvement. He has not been systemically unwell Wounds on the; left heel actually looks improved. Original area on the left foot and proximity to the first and second toes looks about the same. He has superficial areas on the dorsal foot, anterior calf and then the reopening of his original wound on the left lateral calf which looks about the same The only area he has on the right is the dorsal webspace first and second which is smaller. He has a large area of dry erythematous skin on the left buttock small open area here. 11/13/2018; the patient arrives in much better condition. The erythema around the wound on the left lateral calf is a lot better. 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 In any case this is a lot better. The area on the left heel is very small and just about resolved using silver collagen we have been using silver alginate to the areas on his dorsal feet 11/20/2018; his wounds include the left lateral calf, left heel, dorsal aspects of both feet just proximal to the first second webspace. He is stable to slightly improved. I did not think any changes to his dressings were going to be necessary 11/27/2018 he has a reopening on the left buttock which is surrounded by what looks like tinea or perhaps some other form of dermatitis. The area on the left dorsal foot has some erythema around it I have marked this area but I am not sure whether this is cellulitis or not. Left heel is not closed. Left calf the reopening is really slightly longer and probably worse 1/13; in general things look better and smaller except for the left dorsal foot. Area on the left heel is just about closed, left buttock looks better only a small wound remains in the skin looks better [using Lotrisone] 1/20; the area on the left heel only has a few remaining open areas here. Left lateral calf about the same in terms of size, left dorsal foot slightly larger right lateral foot still not closed. The area on the left buttock has no open wound and the surrounding skin looks a lot better 1/27; the area on the left heel is closed. Left lateral calf better but still requiring extensive debridements. The area on his left buttock is closed. He still has the open areas on the left dorsal foot which is slightly smaller in the right foot which is slightly expanded. We have been using Iodoflex on these areas as well 2/3; left heel is  closed. Left lateral calf still requiring debridement using Iodoflex there is no open area on his left buttock however he has dry scaly skin over a large area of this. Not really responding well to the Lotrisone. Finally the areas on his dorsal feet at the level of the  first second webspace are slightly smaller on the right and about the same on the left. Both of these vigorously debrided with Anasept and gauze 2/10; left heel remains closed he has dry erythematous skin over the left buttock but there is no open wound here. Left lateral leg has come in and with. Still requiring debridement we have been using Iodoflex here. Finally the area on the left dorsal foot and right dorsal foot are really about the same extremely dry callused fissured areas. He does not yet have a dermatology appointment 2/17; left heel remains closed. He has a new open area on the left buttock. The area on the left lateral calf is bigger longer and still covered in necrotic debris. No major change in his foot areas bilaterally. I am awaiting for a dermatologist to look on this. We have been using ketoconazole I do not know that this is been doing any good at all. 2/24; left heel remains closed. The left buttock wound that was new reopening last week looks better. The left lateral calf appears better also although still requires debridement. The major area on his foot is the left first second also requiring debridement. We have been putting Prisma on all wounds. I do not believe that the ketoconazole has done too much good for his feet. He will use Lotrisone I am going to give him a 2-week course of terbinafine. We still do not have a dermatology appointment 3/2 left heel remains closed however there is skin over bone in this area I pointed this out to him today. The left buttock wound is epithelialized but still does not look completely stable. The area on the left leg required debridement were using silver collagen here. With regards to his feet we changed to Lotrisone last week and silver alginate. 3/9; left heel remains closed. Left buttock remains closed. The area on the right foot is essentially closed. The left foot remains unchanged. Slightly smaller on the left lateral calf. Using  silver collagen to both of these areas 3/16-Left heel remains closed. Area on right foot is closed. Left lateral calf above the lateral malleolus open wound requiring debridement with easy bleeding. Left dorsal wound proximal to first toe also debrided. Left ischial area open new. Patient has been using Prisma with wrapping every 3 days. Dermatology appointment is apparently tomorrow.Patient has completed his terbinafine 2-week course with some apparent improvement according to him, there is still flaking and dry skin in his foot on the left 3/23; area on the right foot is reopened. The area on the left anterior foot is about the same still a very necrotic adherent surface. He still has the area on the left leg and reopening is on the left buttock. He apparently saw dermatology although I do not have a note. According to the patient who is usually fairly well informed they did not have any good ideas. Put him on oral terbinafine which she is been on before. 3/30; using silver collagen to all wounds. Apparently his dermatologist put him on doxycycline and rifampin presumably some culture grew staph. I do not have this result. He remains on terbinafine although I have used terbinafine on him before 4/6; patient has had  a fairly substantial reopening on the right foot between the first and second toes. He is finished his terbinafine and I believe is on doxycycline and rifampin still as prescribed by dermatology. We have been using silver collagen to all his wounds although the patient reports that he thinks silver alginate does better on the wounds on his buttock. 4/13; the area on his left lateral calf about the same size but it did not require debridement. Left dorsal foot just proximal to the webspace between the first and second toes is about the same. Still nonviable surface. I note some superficial bronze discoloration of the dorsal part of his foot Right dorsal foot just proximal to the first  and second toes also looks about the same. I still think there may be the same discoloration I noted above on the left Left buttock wound looks about the same 4/20; left lateral calf appears to be gradually contracting using silver collagen. 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 The left dorsal foot wound is debrided of tightly adherent necrotic debris and really cleans up quite nicely. The right area is worse with expansion. I did not debride this it is now over the base of the second toe The area on his left buttock is smaller no debridement is required using silver collagen 5/4; left calf continues to make good progress. He arrives with erythema around the wounds on his dorsal foot which even extends to the plantar aspect. Very concerning for coexistent infection. He is finished the erythromycin I gave him for possible erythrasma this does not seem to have helped. The area on the left foot is about the same base of the dorsal toes Is area on the buttock looks improved on the left 5/11; left calf and left buttock continued to make good progress. Left foot is about the same to slightly improved. Major problem is on the right foot. He has not had an x-ray. Deep tissue culture I did last week showed both Enterobacter and E. coli. I did not change the doxycycline I put him on empirically although neither 1 of these were plated to doxycycline. He arrives today with the erythema looking worse on both the dorsal and plantar foot. Macerated skin on the bottom of the foot. he has not been systemically unwell 5/18-Patient returns at 1 week, left calf wound appears to be making some progress, left buttock wound appears slightly worse than last time, left foot wound looks slightly better, right foot redness is marginally better. X-ray of both feet show no air or evidence of osteomyelitis. Patient is finished his Omnicef and terbinafine. He  continues to have macerated skin on the bottom of the left foot as well as right 5/26; left calf wound is better, left buttock wound appears to have multiple small superficial open areas with surrounding macerated skin. X-rays that I did last time showed no evidence of osteomyelitis in either foot. He is finished cefdinir and doxycycline. I do not think that he was on terbinafine. He continues to have a large superficial open area on the right foot anterior dorsal and slightly between the first and second toes. I did send him to dermatology 2 months ago or so wondering about whether they would do a fungal scraping. I do not believe they did but did do a culture. We have been using silver alginate to the toe areas, he has been using antifungals at home topically either ketoconazole or Lotrisone. We are using silver  collagen on the left foot, silver alginate on the right, silver collagen on the left lateral leg and silver alginate on the left buttock 6/1; left buttock area is healed. We have the left dorsal foot, left lateral leg and right dorsal foot. We are using silver alginate to the areas on both feet and silver collagen to the area on his left lateral calf 6/8; the left buttock apparently reopened late last week. He is not really sure how this happened. He is tolerating the terbinafine. Using silver alginate to all wounds 6/15; left buttock wound is larger than last week but still superficial. Came in the clinic today with a report of purulence from the left lateral leg I did not identify any infection Both areas on his dorsal feet appear to be better. He is tolerating the terbinafine. Using silver alginate to all wounds 6/22; left buttock is about the same this week, left calf quite a bit better. His left foot is about the same however he comes in with erythema and warmth in the right forefoot once again. Culture that I gave him in the beginning of May showed Enterobacter and E. coli. I gave him  doxycycline and things seem to improve although neither 1 of these organisms was specifically plated. 6/29; left buttock is larger and dry this week. Left lateral calf looks to me to be improved. Left dorsal foot also somewhat improved right foot completely unchanged. The erythema on the right foot is still present. He is completing the Ceftin dinner that I gave him empirically [see discussion above.) 7/6 - All wounds look to be stable and perhaps improved, the left buttock wound is slightly smaller, per patient bleeds easily, completed ceftin, the right foot redness is less, he is on terbinafine 7/13; left buttock wound about the same perhaps slightly narrower. Area on the left lateral leg continues to narrow. Left dorsal foot slightly smaller right foot about the same. We are using silver alginate on the right foot and Hydrofera Blue to the areas on the left. Unna boot on the left 2 layer compression on the right 7/20; left buttock wound absolutely the same. Area on lateral leg continues to get better. Left dorsal foot require debridement as did the right no major change in the 7/27; left buttock wound the same size necrotic debris over the surface. The area on the lateral leg is closed once again. His left foot looks better right foot about the same although there is some involvement now of the posterior first second toe area. He is still on terbinafine which I have given him for a month, not certain a centimeter major change 06/25/19-All wounds appear to be slightly improved according to report, left buttock wound looks clean, both foot wounds have minimal to no debris the right dorsal foot has minimal slough. We are using Hydrofera Blue to the left and silver alginate to the right foot and ischial wound. 8/10-Wounds all appear to be around the same, the right forefoot distal part has some redness which was not there before, however the wound looks clean and small. Ischial wound looks about the  same with no changes 8/17; his wound on the left lateral calf which was his original chronic venous insufficiency wound remains closed. Since I last saw him the areas on the left dorsal foot right dorsal foot generally appear better but require debridement. The area on his left initial tuberosity appears somewhat larger to me perhaps hyper granulated and bleeds very easily. We have been  using Hydrofera Blue to the left dorsal foot and silver alginate to everything else 8/24; left lateral calf remains closed. The areas on his dorsal feet on the webspace of the first and second toes bilaterally both look better. The area on the left buttock which is the pressure ulcer stage II slightly smaller. I change the dressing to Hydrofera Blue to all areas 8/31; left lateral calf remains closed. The area on his dorsal feet bilaterally look better. Using Hydrofera Blue. Still requiring debridement on the left foot. No change in the left buttock pressure ulcers however 9/14; left lateral calf remains closed. Dorsal feet look quite a bit better than 2 weeks ago. Flaking dry skin also a lot better with the ammonium lactate I gave him 2 weeks ago. The area on the left buttock is improved. He states that his Roho cushion developed a leak and he is getting a new one, in the interim he is offloading this vigorously 9/21; left calf remains closed. Left heel which was a possible DTI looks better this week. He had macerated tissue around the left dorsal foot right foot looks satisfactory and improved left buttock wound. I changed his dressings to his feet to silver alginate bilaterally. Continuing Hydrofera Blue on the left buttock. 9/28 left calf remains closed. Left heel did not develop anything [possible DTI] dry flaking skin on the left dorsal foot. Right foot looks satisfactory. Improved left buttock wound. We are using silver alginate on his feet Hydrofera Blue on the buttock. I have asked him to go back to the  Lotrisone on his feet including the wounds and surrounding areas 10/5; left calf remains closed. The areas on the left and right feet about the same. A lot of this is epithelialized however debris over the remaining open areas. He is using Lotrisone and silver alginate. The area on the left buttock using Hydrofera Blue 10/26. Patient has been out for 3 weeks secondary to Covid concerns. He tested negative but I think his wife tested positive. He comes in today with the left foot substantially worse, right foot about the same. Even more concerning he states that the area on his left buttock closed over but then reopened and is considerably deeper in one aspect than it was before [stage III wound] 11/2; left foot really about the same as last week. Quarter sized wound on the dorsal foot just proximal to the first second toes. Surrounding erythema with areas of denuded epithelium. This is not really much different looking. Did not look like cellulitis this time however. Right foot area about the same.. We have been using silver alginate alginate on his toes Left buttock still substantial irritated skin around the wound which I think looks somewhat better. We have been using Hydrofera Blue here. 11/9; left foot larger than last week and a very necrotic surface. Right foot I think is about the same perhaps slightly smaller. Debris around the circumference also addressed. Unfortunately on the left buttock there is been a decline. Satellite lesions below the major wound distally and now a an additional one posteriorly we have been using Hydrofera Blue but I think this is a pressure issue 11/16; left foot ulcer dorsally again a very adherent necrotic surface. Right foot is about the same. Not much change in the pressure ulcer on his left buttock. 11/30; left foot ulcer dorsally basically the same as when I saw him 2 weeks ago. Very adherent fibrinous debris on the wound surface. Patient reports a lot  of  drainage as well. 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 We have been using Iodoflex, patient changed back to alginate because of the drainage. Area on his right dorsal foot really looks benign with a healthier surface certainly a lot better than on the left. Left buttock wounds all improved using Hydrofera Blue 12/7; left dorsal foot again no improvement. Tightly adherent debris. PCR culture I did last week only showed likely skin contaminant. I have gone ahead and done a punch biopsy of this which is about the last thing in terms of investigations I can think to do. He has known venous insufficiency and venous hypertension and this could be the issue here. The area on the right foot is about the same left buttock slightly worse according to our intake nurse secondary to [facility] Blue sticking to the wound 12/14; biopsy of the left foot that I did last time showed changes that could be related to wound healing/chronic stasis dermatitis phenomenon no neoplasm. We have been using silver alginate to both feet. I change the one on the left today to Sorbact and silver alginate to his other 2 wounds 12/28; the patient arrives with the following problems; Major issue is the dorsal left foot which continues to be a larger deeper wound area. Still with a completely nonviable surface Paradoxically the area mirror image on the right on the right dorsal foot appears to be getting better. He had some loss of dry denuded skin from the lower part of his original wound on the left lateral calf. Some of this area looked a little vulnerable and for this reason we put him in wrap that on this side this week The area on his left buttock is larger. He still has the erythematous circular area which I think is a combination of pressure, sweat. This does not look like cellulitis or fungal dermatitis 11/26/2019; -Dorsal left foot large open wound with depth. Still  debris over the surface. Using Sorbact The area on the dorsal right foot paradoxically has closed over He has a reopening on the left ankle laterally at the base of his original wound that extended up into the calf. This appears clean. The left buttock wound is smaller but with very adherent necrotic debris over the surface. We have been using silver alginate here as well The patient had arterial studies done in 2017. He had biphasic waveforms at the dorsalis pedis and posterior tibial bilaterally. ABI in the left was 1.17. Digit waveforms were dampened. He has slight spasticity in the great toes I do not think a TBI would be possible 1/11; the patient comes in today with a sizable reopening between the first and second toes on the right. This is not exactly in the same location where we have been treating wounds previously. According to our intake nurse this was actually fairly deep but 0.6 cm. The area on the left dorsal foot looks about the same the surface is somewhat cleaner using Sorbact, his MRI is in 2 days. We have not managed yet to get arterial studies. The new reopening on the left lateral calf looks somewhat better using alginate. The left buttock wound is about the same using alginate 1/18; the patient had his ARTERIAL studies which were quite normal. ABI in the right at 1.13 with triphasic/biphasic waveforms on the left ABI 1.06 again with triphasic/biphasic waveforms. It would not have been possible to have done a toe brachial index because of spasticity. We have  been using Sorbac to the left foot alginate to the rest of his wounds on the right foot left lateral calf and left buttock 1/25; arrives in clinic with erythema and swelling of the left forefoot worse over the first MTP area. This extends laterally dorsally and but also posteriorly. Still has an area on the left lateral part of the lower part of his calf wound it is eschared and clearly not closed. Area on the left buttock  still with surrounding irritation and erythema. Right foot surface wound dorsally. The area between the right and first and second toes appears better. 2/1; The left foot wound is about the same. Erythema slightly better I gave him a week of doxycycline empirically Right foot wound is more extensive extending between the toes to the plantar surface Left lateral calf really no open surface on the inferior part of his original wound however the entire area still looks vulnerable Absolutely no improvement in the left buttock wound required debridement. 2/8; the left foot is about the same. Erythema is slightly improved I gave him clindamycin last week. Right foot looks better he is using Lotrimin and silver alginate He has a breakdown in the left lateral calf. Denuded epithelium which I have removed Left buttock about the same were using Hydrofera Blue 2/15; left foot is about the same there is less surrounding erythema. Surface still has tightly adherent debris which I have debriding however not making any progress Right foot has a substantial wound on the medial right second toe between the first and second webspace. Still an open area on the left lateral calf distal area. Buttock wound is about the same 2/22; left foot is about the same less surrounding erythema. Surface has adherent debris. Polymen Ag Right foot area significant wound between the first and second toes. We have been using silver alginate here Left lateral leg polymen Ag at the base of his original venous insufficiency wound Left buttock some improvement here 3/1; Right foot is deteriorating in the first second toe webspace. Larger and more substantial. We have been using silver alginate. Left dorsal foot about the same markedly adherent surface debris using PolyMem Ag Left lateral calf surface debris using PolyMem AG Left buttock is improved again using PolyMem Ag. He is completing his terbinafine. The erythema in the foot  seems better. He has been on this for 2 weeks 3/8; no improvement in any wound area in fact he has a small open area on the dorsal midfoot which is new this week. He has not gotten his foot x-rays yet 3/15; his x-rays were both negative for osteomyelitis of both feet. No major change in any of his wounds on the extremities however his buttock wounds are better. We have been using polymen on the buttocks, left lower leg. Iodoflex on the left foot and silver alginate on the right 3/22; arrives in clinic today with the 2 major issues are the improvement in the left dorsal foot wound which for once actually looks healthy with a nice healthy wound surface without debridement. Using Iodoflex here. Unfortunately on the left lateral calf which is in the distal part of his original wound he came to the clinic here for there was purulent drainage noted some increased breakdown scattered around the original area and a small area proximally. We we are using polymen here will change to silver alginate today. His buttock wound on the left is better and I think the area on the right first second toe webspace is  also improved 3/29; left dorsal foot looks better. Using Iodoflex. Left ankle culture from deterioration last time grew E. coli, Enterobacter and Enterococcus. I will give him a course of cefdinir although that will not cover Enterococcus. The area on the right foot in the webspace of the first and second toe lateral first toe looks better. The area on his buttock is about healed Vascular appointment is on April 21. This is to look at his venous system vis--vis continued breakdown of the wounds on the left including the left lateral leg and left dorsal foot he. He has had previous ablations on this side 4/5; the area between the right first and second toes lateral aspect of the first toe looks better. Dorsal aspect of the left first toe on the left foot also improved. Unfortunately the left lateral lower leg  is larger and there is a second satellite wound superiorly. The usual superficial abrasions on the left buttock overall better but certainly not closed 4/12; the area between the right first and second toes is improved. Dorsal aspect of the left foot also slightly smaller with a vibrant healthy looking surface. No real change in the left lateral leg and the left buttock wound is healed He has an unaffordable co-pay for Apligraf. Appointment with vein and vascular with regards to the left leg venous part of the circulation is on 4/21 4/19; we continue to see improvement in all wound areas. Although this is minor. He has his vascular appointment on 4/21. The area on the left buttock has not reopened although right in the center of this area the skin looks somewhat threatened 4/26; the left buttock is unfortunately reopened. In general his left dorsal foot has a healthy surface and looks somewhat smaller although it was not measured as such. The area between his first and second toe webspace on the right as a small wound against the first toe. The patient saw vascular surgery. The real question I was asking was about the small saphenous vein on the left. He has previously ablated left greater saphenous vein. Nothing further was commented on on the left. Right greater saphenous vein without reflux at the saphenofemoral junction or proximal thigh there was no indication for ablation of the right greater saphenous vein duplex was negative for DVT bilaterally. They did not think there was anything from a vascular surgery point of view that could be offered. They ABIs within normal limits 5/3; only small open area on the left buttock. The area on the left lateral leg which was his original venous reflux is now 2 wounds both which look clean. We are using Iodoflex on the left dorsal foot which looks healthy and smaller. He is down to a very tiny area between the right first and second toes, using  silver alginate 5/10; all of his wounds appear better. We have much better edema control in 4 layer compression on the left. This may be the factor that is allowing the left foot and left lateral calf to heal. He has external compression garments at home 04/14/20-All of his wounds are progressing well, the left forefoot is practically closed, left ischium appears to be about the same, right toe webspace is also smaller. The left lateral leg is about the same, continue using Hydrofera Blue to this, silver alginate to the ischium, Iodoflex to the toe space on the right 6/7; most of his wounds outside of the left buttock are doing well. The area on the left lateral calf and left  dorsal foot are smaller. The area on the right foot in between the first and second toe webspace is barely visible although he still says there is some drainage here is the only reason I did not heal this out. Unfortunately the area on the left buttock almost looks like he has a skin tear from tape. He has open wound and then a large flap of skin that we are trying to get adherence over an area just next to the remaining wound 6/21; 2 week follow-up. I believe is been here for nurse visits. Miraculously the area between his first and second toes on the left dorsal foot is closed over. Still open on the right first second web space. The left lateral calf has 2 open areas. Distally this is more superficial. The proximal area had a little more depth and required debridement of adherent necrotic material. His buttock wound is actually larger we have been using silver alginate here 6/28; the patient's area on the left foot remains closed. Still open wet area between the first and second toes on the right and also extending into the plantar aspect. We have been using silver alginate in this location. He has 2 areas on the left lower leg part of his original long wounds which I think are better. We have been using Hydrofera Blue here.  Hydrofera Blue to the left buttock which is stable 7/12; left foot remains closed. Left ankle is closed. May be a small area between his right first and second toes the only truly open area is on the left buttock. We have been using Hydrofera Blue here 7/19; patient arrives with marked deterioration especially in the left foot and ankle. We did not put him in a compression wrap on the left last week in fact he wore his juxta lite stockings on either side although he does not have an underlying stocking. He has a reopening on the left dorsal foot, left lateral ankle and a new area on the right dorsal ankle. More worrisome is the degree of erythema on the left foot extending on the lateral foot into the lateral lower leg on the left 7/26; the patient had erythema and drainage from the lateral left ankle last week. Culture of this grew MRSA resistant to doxycycline and clindamycin which are the 2 antibiotics we usually use with this patient who has multiple antibiotic allergies including linezolid, trimethoprim sulfamethoxazole. I had give him an empiric doxycycline and he comes in the area certainly looks somewhat better although it is blotchy in his lower leg. He has not been systemically unwell. He has had areas on the left dorsal foot which is a reopening, chronic wounds on the left lateral ankle. Both of these I think are secondary to chronic venous insufficiency. The area between his first and second toes is closed as far as I can tell. He had a new wrap injury on the right dorsal ankle last week. Finally he has an area on the left buttock. We have been using silver alginate to everything except the left buttock we are using Hydrofera Blue 06/30/20-Patient returns at 1 week, has been given a sample dose pack of NUZYRA which is a tetracycline derivative [omadacycline], patient has completed those, we have been using silver alginate to almost all the wounds except the left ischium where we are using  Hydrofera Blue all of them look better 8/16; since I last saw the patient he has been doing well. The area on the left buttock, left lateral  ankle and left foot are all closed today. He has completed the [place] I gave him last time and tolerated this well. He still has open areas on the right dorsal ankle and in the right first second toe area which we are using silver alginate. 8/23; we put him in his bilateral external compression stockings last week as he did not have anything open on either leg except for concerning area between the right first and second toe. He comes in today with an area on the left dorsal foot slightly more proximal than the original wound, the left lateral foot but this is actually a continuation of the area he had on the left lateral ankle from last time. As well he is opened up on the left buttock again. 8/30; comes in today with things looking a lot better. The area on the left lower ankle has closed down as has the left foot but with eschar in both areas. The area on the dorsal right ankle is also epithelialized. Very little remaining of the left buttock wound. We have been using silver alginate on all wound areas 9/13; the area in the first second toe webspace on the right has fully epithelialized. He still has some vulnerable epithelium on the right and the ankle and the dorsal foot. He notes weeping. He is using his juxta lite stocking. On the left again the left dorsal foot is closed left lateral ankle is closed. We went to the juxta lite stocking here as well. Still vulnerable in the left buttock although only 2 small open areas remain here 9/27; 2-week follow-up. We did not look at his left leg but the patient says everything is closed. He is a bit disturbed by the amount of edema in his left foot he is using juxta lite stockings but asking about over the toes stockings which would be 30/40, will talk to him next time. According to him there is no open wound on  either the left foot or the left ankle/calf He has an open area on the dorsal right calf which I initially point a wrap injury. He has superficial remaining wound on the left ischial tuberosity been using silver alginate although he says this sticks to the wound 10/5; we gave him 2-week follow-up but he called yesterday expressing some concerns about his right foot right ankle and the left buttock. He came in early. There is still no open areas on the left leg and that still in his juxta lite stocking 10/11; he only has 1 small area on the left buttock that remains measuring millimeters 1 mm. Still has the same irritated skin in this area. We recommended zinc oxide when this eventually closes and pressure relief is meticulously is he can do this. He still has an area on the dorsal part of his right first through third toes which is a bit irritated and still open and on the dorsal ankle near the crease of the ankle. We have been using silver alginate and using his own stocking. He has nothing open on the left leg or foot 10/25; 2-week follow-up. Not nearly as good on the left buttock as I was hoping. For open areas with 5 looking threatened small. He has the erythematous irritated chronic skin in this area. 1 area on the right dorsal ankle. He reports this area bleeds easily Right dorsal foot just proximal to the base of his toes We have been using silver alginate. 11/8; 2-week follow-up. Left buttock is about the same  although I do not think the wounds are in the same location we have been using silver alginate. I have asked him to use zinc oxide on the skin around the wounds. He still has a small area on the right dorsal ankle he reports this bleeds easily Right dorsal foot just proximal to the base of the toes does not have anything open although the skin is very dry and scaly He has a new opening on the nailbed of the left great toe. Nothing on the left ankle 11/29; 3-week follow-up. Left  buttock has 2 open areas. And washing of these wounds today started bleeding easily. Suggesting very friable tissue. We have been using silver alginate. Right dorsal ankle which I thought was initially a wrap injury we have been using silver alginate. Nothing open between the toes that I can see. He states the area on the left dorsal toe nailbed healed after the last visit in 2 or 3 days 12/13; 3-week follow-up. His left buttock now has 3 open areas but the original 2 areas are smaller using polymen here. Surrounding skin looks better. The right dorsal ankle is closed. He has a small opening on the right dorsal foot at the level of the third toe. In general the skin looks better here. He is wearing his juxta lite stocking on the left leg says there is nothing open 11/24/2020; 3 weeks follow-up. His left buttock still has the 3 open areas. We have been using polymen but due to lack of response he changed to [facility] area. Surrounding skin is dry erythematous and irritated looking. There is no evidence of infection either bacterial or fungal however there is loss of surface epithelium He still has very dry skin in his foot causing irritation and erythema on the dorsal part of his toes. This is not responded to prolonged courses of antifungal simply looks dry and irritated 1/24; left buttock area still looks about the same he was unable to find the triad ointment that we had suggested. The area on the right lower leg just above the dorsal ankle has reopened and the areas on the right foot between the first second and second third toes and scaling on the bottom of the foot has been about the same for quite some time now. been using silver alginate to all wound areas 2/7; left buttock wound looked quite good although not much smaller in terms of surface area surrounding skin looks better. Only a few dry flaking areas on the right foot in between the first and second toes the skin generally looks better  here [ammonium lactate]. Finally the area on the right dorsal ankle is closed 2/21; There is no open area on the right foot even between the right first and second toe. Skin around this area dorsally and plantar aspects look better. He has a reopening of the area on the right ankle just above the crease of the ankle dorsally. I continue to think that this is probably friction from spasms may be even this time with his stocking under the compression stockings. Wounds on his left buttock look about the same there a couple of areas that have reopened. He has a total square area of loss of epithelialization. This does not look like infection it looks like a contact dermatitis but I just cannot determine to what 3/14; there is nothing on the right foot between the first and second toes this was carefully inspected under illumination. Some chronic irritation on the dorsal part  of his foot from toes 1-3 at the base. Nothing really open here substantially. Still has an area on the right foot/ankle that is actually larger and hyper granulated. His buttock area on the left is just about closed however he has chronic inflammation with loss of the surface epithelial layer 3/28; 2-week follow-up. In clinic today with a new wound on the left anterior mid tibia. Says this happened about 2 weeks ago. He is not really sure how wonders about the spasticity of his legs at night whether that could have caused this other than that he does not have a good idea. He has been using topical antibiotics and silver alginate. The area on his right dorsal ankle seems somewhat better. Finally everything on his left buttock is closed. 4/11; 2-week follow-up. All of his wounds are better except for the area over the ischium and left buttock which have opened up widely again. At least part of this is covered in necrotic fibrinous material another part had rolled nonviable skin. The area on the right ankle, left anterior mid tibia are  both a lot better. He had no open wounds on either foot including the areas between the first and second toes 4/25; patient presents for 2-week follow-up. He states that the wounds are overall stable. He has no complaints today and states he is using Hydrofera Blue to open wounds. 5/9; have not seen this man in over a month. For my memory he has open areas on the left mid tibia and right ankle. T oday he has new open area on the right dorsal foot which we have not had a problem with recently. He has the sustained area on the left buttock He is also changed his insurance at the beginning of the year [employer]. We will need prior authorizations for debridement 5/23; patient presents for 2-week follow-up. He has prior authorizations for debridement. He denies any issues in the past 2 weeks with his wound care. He has been using Hydrofera Blue to all the wounds. He does report a circular rash to the upper left leg that is new. He denies acute signs of infection. 6/6; 2-week follow-up. The patient has open wounds on the left buttock which are worse than the last time I saw this about a month ago. He also has a new area to me on the left anterior mid tibia with some surrounding erythema. The area on the dorsal ankle on the right is closed but I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 studies and is status post ablation although I don't have any exact notes on which vein was ablated. I'll need to check the surgical record 11/04/16; he's had a reopening between the first and second toe on the left and right. No major change in the left lateral leg wound. There is what appears to be cellulitis of the left dorsal foot 11/18/16 the patient was hospitalized initially in [place] and then subsequently transferred to [facility] and was admitted there from 11/09/16 through 11/12/16. He had developed progressive cellulitis on the right leg in spite of the doxycycline I gave him. I'd spoken to the hospitalist in [place] who was concerned about continuing leukocytosis. CT scan is what I suggested this was done which showed soft tissue swelling without evidence of osteomyelitis or an underlying abscess blood cultures were negative. At [facility] he was treated with vancomycin and Primaxin and then add an infectious disease consult. He was transitioned to Ceftaroline. He has been making progressive improvement. Overall a severe cellulitis of the right leg. He is been using silver alginate to her original wound on the left leg. The wounds in his toes on the right are closed there is a small open area on the base of the left second toe 11/26/15; the patient's right leg is much better although there is still some edema here this could be reminiscent from his severe cellulitis likely on top of some degree of lymphedema. His left anterior leg wound has less surface slough as reported by her intake nurse. Small wound at the base of the left second toe 12/02/16; patient's right leg is better and there is no open wound here. His left anterior lateral leg wound  continues to have a healthy-looking surface. Small wound at the base of the left second toe however there is erythema in the left forefoot which is worrisome 12/16/16; is no open wounds on his right leg. We took measurements for stockings. His left anterior lateral leg wound continues to have a healthy-looking surface. I'm not sure where we were with the Apligraf run through his insurance. We have been using Iodoflex. He has a thick eschar on the left first second toe interface, I suspect this may be fungal however there is no visible open 12/23/16; no open wound on his right leg. He has 2 small areas left of the linear wound that was remaining last week. We have been using Prisma, I thought I have disclosed this week, we can only look forward to next week 01/03/17; the patient had concerning areas of erythema last week, already on doxycycline for UTI through his primary doctor. The erythema is absolutely no better there is warmth and swelling both medially from the left lateral leg wound and also the dorsal left foot. 01/06/17- Patient is here for follow-up evaluation of his left lateral leg ulcer and bilateral feet ulcers. He is on oral antibiotic therapy, tolerating that. Nursing staff and the patient states that the erythema is improved from Monday. 01/13/17; the predominant left lateral leg  wound continues to be problematic. I had put Apligraf on him earlier this month once. However he subsequently developed what appeared to be an intense cellulitis around the left lateral leg wound. I gave him Dalvance I think on 2/12 perhaps 2/13 he continues on cefdinir. The erythema is still present but the warmth and swelling is improved. I am hopeful that the cellulitis part of this control. I wouldn't be surprised if there is an element of venous inflammation as well. 01/17/17. The erythema is present but better in the left leg. His left lateral leg wound still does not have a viable surface buttons certain parts  of this long thin wound it appears like there has been improvement in dimensions. 01/20/17; the erythema still present but much better in the left leg. I'm thinking this is his usual degree of chronic venous inflammation. The wound on the left leg looks somewhat better. Is less surface slough 01/27/17; erythema is back to the chronic venous inflammation. The wound on the left leg is somewhat better. I am back to the point where I like to try an Apligraf once again 02/10/17; slight improvement in wound dimensions. Apligraf #2. He is completing his doxycycline 02/14/17; patient arrives today having completed doxycycline last Thursday. This was supposed to be a nurse visit however once again he hasn't tense erythema from the medial part of his wound extending over the lower leg. Also erythema in his foot this is roughly in the same distribution as last time. He has baseline chronic venous inflammation however this is a lot worse than the baseline I have learned to accept the on him is baseline inflammation 02/24/17- patient is here for follow-up evaluation. He is tolerating compression therapy. His voicing no complaints or concerns he is here anticipating an Apligraf 03/03/17; he arrives today with an adherent necrotic surface. I don't think this is surface is going to be amenable for Apligraf's. The erythema around his wound and on the left dorsal foot has resolved he is off antibiotics 03/10/17; better-looking surface today. I don't think he can tolerate Apligraf's. He tells me he had a wound VAC after a skin graft years ago to this area and they had difficulty with a seal. The erythema continues to be stable around this some degree of chronic venous inflammation but he also has recurrent cellulitis. We have been using Iodoflex 03/17/17; continued improvement in the surface and may be small changes in dimensions. Using Iodoflex which seems the only thing that will control his surface 03/24/17- He is here for  follow up evaluation of his LLE lateral ulceration and ulcer to right dorsal foot/toe space. He is voicing no complaints or concerns, He is tolerating compression wrap. 03/31/17 arrives today with a much healthier looking wound on the left lower extremity. We have been using Iodoflex for a prolonged period of time which has for the first time prepared and adequate looking wound bed although we have not had much in the way of wound dimension improvement. He also has a small wound between the first and second toe on the right 04/07/17; arrives today with a healthy-looking wound bed and at least the top 50% of this wound appears to be now her. No debridement was required I have changed him to [facility] last week after prolonged Iodoflex. He did not do well with Apligraf's. We've had a re-opening between the first and second toe on the right 04/14/17; arrives today with a healthier looking wound bed contractions and the top 50%  of this wound and some on the lesser 50%. Wound bed appears healthy. The area between the first and second toe on the right still remains problematic 04/21/17; continued very gradual improvement. Using [facility] 04/28/17; continued very gradual improvement in the left lateral leg venous insufficiency wound. His periwound erythema is very mild. We have been using Hydrofera Blue. Wound is making progress especially in the superior 50% 05/05/17; he continues to have very gradual improvement in the left lateral venous insufficiency wound. Both in terms with an length rings are improving. I debrided this every 2 weeks with #5 curet and we have been using Hydrofera Blue and again making good progress With regards to the wounds between his right first and second toe which I thought might of been tinea pedis he is not making as much progress very dry scaly skin over the area. Also the area at the base of the left first and second toe in a similar condition 05/12/17; continued gradual  improvement in the refractory left lateral venous insufficiency wound on the left. Dimension smaller. Surface still requiring debridement using Hydrofera Blue 05/19/17; continued gradual improvement in the refractory left lateral venous ulceration. Careful inspection of the wound bed underlying rumination suggested some degree of epithelialization over the surface no debridement indicated. Continue Hydrofera Blue difficult areas between his toes first and third on the left than first and second on the right. I'm going to change to silver alginate from silver collagen. Continue ketoconazole as I suspect underlying tinea pedis 05/26/17; left lateral leg venous insufficiency wound. We've been using Hydrofera Blue. I believe that there is expanding epithelialization over the surface of the wound albeit not coming from the wound circumference. This is a bit of an odd situation in which the epithelialization seems to be coming from the surface of the wound rather than in the exact circumference. There is still small open areas mostly along the lateral margin of the wound. ooHe has unchanged areas between the left first and second and the right first second toes which I been treating for tenia pedis 06/02/17; left lateral leg venous insufficiency wound. We have been using Hydrofera Blue. Somewhat smaller from the wound circumference. The surface of the wound remains a bit on it almost epithelialized sedation in appearance. I use an open curette today debridement in the surface of all of this especially the edges ooSmall open wounds remaining on the dorsal right first and second toe interspace and the plantar left first second toe and her face on the left 06/09/17; wound on the left lateral leg continues to be smaller but very gradual and very dry surface using Hydrofera Blue 06/16/17 requires weekly debridements now on the left lateral leg although this continues to contract. I changed to silver collagen last  week because of dryness of the wound bed. Using Iodoflex to the areas on his first and second toes/web space bilaterally 06/24/17; patient with history of paraplegia also chronic venous insufficiency with lymphedema. Has a very difficult wound on the left lateral leg. This has been gradually reducing in terms of with but comes in with a very dry adherent surface. High switch to silver collagen a week or so ago with hydrogel to keep the area moist. This is been refractory to multiple dressing attempts. He also has areas in his first and second toes bilaterally in the anterior and posterior web space. I had been using Iodoflex here after a prolonged course of silver alginate with ketoconazole was ineffective [question tinea pedis]  07/14/17; patient arrives today with a very difficult adherent material over his left lateral lower leg wound. He also has surrounding erythema and poorly controlled edema. He was switched his Santyl last visit which the nurses are applying once during his doctor visit and once on a nurse visit. He was also reduced to 2 layer compression I'm not exactly sure of the issue here. 07/21/17; better surface today after 1 week of Iodoflex. Significant cellulitis that we treated last week also better. [Doxycycline] 07/28/17 better surface today with now 2 weeks of Iodoflex. Significant cellulitis treated with doxycycline. He has now completed the doxycycline and he is back to his usual degree of chronic venous inflammation/stasis dermatitis. He reminds me he has had ablations surgery here 08/04/17; continued improvement with Iodoflex to the left lateral leg wound in terms of the surface of the wound although the dimensions are better. 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 Problematic areas on the plantar aspect of the first second toe web space on the left and the dorsal aspect of the first second toe web space on the  right. At one point I felt these were probably related to chronic fungal infections in treated him aggressively for this although we have not made any improvement here. 08/11/17; left lateral leg. Surface continues to improve with the Iodoflex although we are not seeing much improvement in overall wound dimensions. Areas on his plantar left foot and right foot show no improvement. In fact the right foot looks somewhat worse 08/18/17; left lateral leg. We changed to [facility] Blue last week after a prolonged course of Iodoflex which helps get the surface better. It appears that the wound with is improved. Continue with difficult areas on the left dorsal first second and plantar first second on the right 09/01/17; patient arrives in clinic today having had a temperature of 103 yesterday. He was seen in the ER and [facility]. The patient was concerned he could have cellulitis again in the right leg however they diagnosed him with a UTI and he is now on Keflex. He has a history of cellulitis which is been recurrent and difficult but this is been in the left leg, in the past 5 use doxycycline. He does in and out catheterizations at home which are risk factors for UTI 09/08/17; patient will be completing his Keflex this weekend. The erythema on the left leg is considerably better. He has a new wound today on the medial part of the right leg small superficial almost looks like a skin tear. He has worsening of the area on the right dorsal first and second toe. His major area on the left lateral leg is better. Using Hydrofera Blue on all areas 09/15/17; gradual reduction in width on the long wound in the left lateral leg. No debridement required. He also has wounds on the plantar aspect of his left first second toe web space and on the dorsal aspect of the right first second toe web space. 09/22/17; there continues to be very gradual improvements in the dimensions of the left lateral leg wound. He hasn't round  erythematous spot with might be pressure on his wheelchair. There is no evidence obviously of infection no purulence no warmth ooHe has a dry scaled area on the plantar aspect of the left first second toe ooImproved area on the dorsal right first second toe. 09/29/17; left lateral leg wound continues to improve in dimensions mostly with an is still  a fairly long but increasingly narrow wound. ooHe has a dry scaled area on the plantar aspect of his left first second toe web space ooIncreasingly concerning area on the dorsal right first second toe. In fact I am concerned today about possible cellulitis around this wound. The areas extending up his second toe and although there is deformities here almost appears to abut on the nailbed. 10/06/17; left lateral leg wound continues to make very gradual progress. Tissue culture I did from the right first second toe dorsal foot last time grew MRSA and enterococcus which was vancomycin sensitive. This was not sensitive to clindamycin or doxycycline. He is allergic to Zyvox and sulfa we have therefore arrange for him to have dalvance infusion tomorrow. He is had this in the past and tolerated it well 10/20/17; left lateral leg wound continues to make decent progress. This is certainly reduced in terms of with there is advancing epithelialization.ooThe cellulitis in the right foot looks better although he still has a deep wound in the dorsal aspect of the first second toe web space. Plantar left first toe web space on the left I think is making some progress 10/27/17; left lateral leg wound continues to make decent progress. Advancing epithelialization.using Hydrofera Blue ooThe right first second toe web space wound is better-looking using silver alginate ooImprovement in the left plantar first second toe web space. Again using silver alginate 11/03/17 left lateral leg wound continues to make decent progress albeit slowly. Using Hydrofera Blue ooThe  right per second toe web space continues to be a very problematic looking punched out wound. I obtained a piece of tissue for deep culture I did extensively treated this for fungus. It is difficult to imagine that this is a pressure area as the patient states other than going outside he doesn't really wear shoes at home ooThe left plantar first second toe web space looked fairly senescent. Necrotic edges. This required debridement oochange to Hydrofera Blue to all wound areas 11/10/17; left lateral leg wound continues to contract. Using Hydrofera Blue ooOn the right dorsal first second toe web space dorsally. Culture I did of this area last week grew MRSA there is not an easy oral option in this patient was multiple antibiotic allergies or intolerances. This was only a rare culture isolate I'm therefore going to use Bactroban under silver alginate ooOn the left plantar first second toe web space. Debridement is required here. This is also unchanged 11/17/17; left lateral leg wound continues to contract using Hydrofera Blue this is no longer the major issue. ooThe major concern here is the right first second toe web space. He now has an open area going from dorsally to the plantar aspect. There is now wound on the inner lateral part of the first toe. Not a very viable surface on this. There is erythema spreading medially into the forefoot. ooNo major change in the left first second toe plantar wound 11/24/17; left lateral leg wound continues to contract using Hydrofera Blue. Nice improvement today ooThe right first second toe web space all of this looks a lot less angry than last week. I have given him clindamycin and topical Bactroban for MRSA and terbinafine for the possibility of underlining tinea pedis that I could not control with ketoconazole. Looks somewhat better ooThe area on the plantar left first second toe web space is weeping with dried debris around the wound 12/01/17; left lateral  leg wound continues to contract he Hydrofera Blue. It is becoming thinner in terms of  with nevertheless it is making good improvement. ooThe right first second toe web space looks less angry but still a large necrotic-looking wounds starting on the plantar aspect of the right foot extending between the toes and now extensively on the base of the right second toe. I gave him clindamycin and topical Bactroban for MRSA anterior benefiting for the possibility of underlying tinea pedis. Not looking better today ooThe area on the left first/second toe looks better. Debrided of necrotic debris 12/05/17* the patient was worked in urgently today because over the weekend he found blood on his incontinence bad when he woke up. He was found to have an ulcer by his wife who does most of his wound care. He came in today for [place] to look at this. He has not had a history of wounds in his buttocks in spite of his paraplegia. 12/08/17; seen in follow-up today at his usual appointment. He was seen earlier this week and found to have a new wound on his buttock. We also follow him for wounds on the left lateral leg, left first second toe web space and right first second toe web space 12/15/17; we have been using Hydrofera Blue to the left lateral leg which has improved. The right first second toe web space has also improved. Left first second toe web space plantar aspect looks stable. The left buttock has worsened using Santyl. Apparently the buttock has drainage 12/22/17; we have been using Hydrofera Blue to the left lateral leg which continues to improve now 2 small wounds separated by normal skin. He tells [place] he had a fever up to 100 yesterday he is prone to UTIs but has not noted anything different. He does in and out catheterizations. The area between the first and second toes today does not look good necrotic surface covered with what looks to be purulent drainage and erythema extending into the third toe. I had gotten  this to something that I thought look better last time however it is not look good today. He also has a necrotic surface over the buttock wound which is expanded. I thought there might be infection under here so I removed a lot of the surface with a #5 curet though nothing look like it really needed culturing. He is been using Santyl to this area 12/27/17; his original wound on the left lateral leg continues to improve using Hydrofera Blue. I gave him samples of Baxdella although he was unable to take them out of fear for an allergic reaction ["lump in his throat"].the culture I did of the purulent drainage from his second toe last week showed both enterococcus and a set Enterobacter I was also concerned about the erythema on the bottom of his foot although paradoxically although this looks somewhat better today. 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 We've been using silver alginate here. They came up today that he sleeps in a recliner, I'm not sure why but I asked him to stop this 01/03/18; his original wound we've been using Hydrofera Blue is now separated into 2 areas. ooUlcer on his left buttock is better he is off the recliner and sleeping in bed ooFinally both wound areas between his first and second toes also looks some better 01/10/18; his original wound on the left lateral leg is now separated into 2 wounds we've been using Hydrofera Blue ooUlcer on his left buttock has some drainage. There is  a small probing site going into muscle layer superiorly.using silver alginate -He arrives today with a deep tissue injury on the left heel ooThe wound on the dorsal aspect of his first second toe on the left looks a lot betterusing silver alginate ketoconazole ooThe area on the first second toe web space on the right also looks a lot bette 01/17/18; his original wound on the left lateral leg continues to  progress using Hydrofera Blue ooUlcer on his left buttock also is smaller surface healthier except for a small probing site going into the muscle layer superiorly. 2.4 cm of tunneling in this area ooDTI on his left heel we have only been offloading. Looks better than last week no threatened open no evidence of infection oothe wound on the dorsal aspect of the first second toe on the left continues to look like it's regressing we have only been using silver alginate and terbinafine orally ooThe area in the first second toe web space on the right also looks to be a lot better using silver alginate and terbinafine I think this was prompted by tinea pedis 01/31/18; the patient was hospitalized in [place] last week apparently for a complicated UTI. He was discharged on cefepime he does in and out catheterizations. In the hospital he was discovered M I don't mild elevation of AST and ALT and the terbinafine was stopped.predictably the pressure ulcer on s his buttock looks betterusing silver alginate. The area on the left lateral leg also is better using Hydrofera Blue. The area between the first and second toes on the left better. First and second toes on the right still substantial but better. Finally the DTI on the left heel has held together and looks like it's resolving 02/07/18-he is here in follow-up evaluation for multiple ulcerations. He has new injury to the lateral aspect of the last issue a pressure ulcer, he states this is from adhesive removal trauma. He states he has tried multiple adhesive products with no success. All other ulcers appear stable. The left heel DTI is resolving. We will continue with same treatment plan and follow-up next week. 02/14/18; follow-up for multiple areas. ooHe has a new area last week on the lateral aspect of his pressure ulcer more over the posterior trochanter. The original pressure ulcer looks quite stable has healthy granulation. 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 Almost fully epithelialized on the original superior area using Hydrofera Blue ooDTI on the left heel has peeled off this week to reveal a small superficial wound under denuded skin and subcutaneous tissue ooBoth areas between the first and second toes look better including nothing open on the left 02/21/18; ooThe patient's wounds on his left ischial tuberosity and posterior left greater trochanter actually looked better. He has a large area of irritation around the area which I think is contact dermatitis. I am doubtful that this is fungal ooHis original wound on the left lateral calf continues to improve we have been using Hydrofera Blue ooThere is no open area in the left first second toe web space although there is a lot of thick callus ooThe DTI on the left heel required debridement today of necrotic surface eschar and subcutaneous tissue using silver alginate ooFinally the area on the right first second toe webspace continues to contract using silver alginate and ketoconazole 02/28/18 ooLeft ischial tuberosity wounds look better using silver alginate. ooOriginal wound on the left  calf only has one small open area left using Hydrofera Blue ooDTI on the left heel required debridement mostly removing skin from around this wound surface. Using silver alginate ooThe areas on the right first/second toe web space using silver alginate and ketoconazole 03/08/18 on evaluation today patient appears to be doing decently well as best I can tell in regard to his wounds. This is the first time that I have seen him as he generally is followed by Dr. Dellia Nims. With that being said none of his wounds appear to be infected he does have an area where there is some skin covering what appears to be a new wound on the left dorsal surface of his great toe. This is right at the nail bed.  With that being said I do believe that debrided away some of the excess skin can be of benefit in this regard. Otherwise he has been tolerating the dressing changes without complication. 03/14/18; patient arrives today with the multiplicity of wounds that we are following. He has not been systemically unwell ooOriginal wound on the left lateral calf now only has 2 small open areas we've been using Hydrofera Blue which should continue ooThe deep tissue injury on the left heel requires debridement today. We've been using silver alginate ooThe left first second toe and the right first second toe are both are reminiscence what I think was tinea pedis. Apparently some of the callus Surface between the toes was removed last week when it started draining. ooPurulent drainage coming from the wound on the ischial tuberosity on the left. 03/21/18-He is here in follow-up evaluation for multiple wounds. 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 He tolerated debridement. The only change to last week's recommendations is to discontinue antifungal cream between toes. He will follow-up next week 03/28/18; following up for multiple wounds;Concern this week is streaking redness and swelling in the right foot. He is going to need antibiotics for this. 03/31/18; follow-up for right foot cellulitis. Streaking redness and swelling in the right foot on 03/28/18. 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 I put him on clindamycin 300 mg every 6 and brought him in for a quick check. He has an open wound between his first and second toes on the right foot as a potential source. 04/04/18; ooRight foot cellulitis is resolving he is completing clindamycin. This is truly good news ooLeft lateral calf wound which is initial wound only has one small open area inferiorly this is close to healing out. He has compression stockings. We will  use Hydrofera Blue right down to the epithelialization of this ooNonviable surface on the left heel which was initially pressure with a DTI. We've been using Hydrofera Blue. I'm going to switch this back to silver alginate ooLeft first second toe/tinea pedis this looks better using silver alginate ooRight first second toe tinea pedis using silver alginate ooLarge pressure ulcers on theLeft ischial tuberosity. Small wound here Looks better. I am uncertain about the surface over the large wound. Using silver alginate 04/11/18; ooCellulitis in the right foot is resolved ooLeft lateral calf wound which was his original wounds still has 2 tiny open areas remaining this is just about closed ooNonviable surface on the left heel is better but still requires debridement ooLeft first second toe/tinea pedis still open using silver alginate ooRight first second toe wound tinea pedis I asked him to go back to using ketoconazole and silver alginate ooLarge pressure  ulcers on the left ischial tuberosity this shear injury here is resolved. Wound is smaller. No evidence of infection using silver alginate 04/18/18; ooPatient arrives with an intense area of cellulitis in the right mid lower calf extending into the right heel area. Bright red and warm. Smaller area on the left anterior leg. He has a significant history of MRSA. He will definitely need antibioticsoodoxycycline ooHe now has 2 open areas on the left ischial tuberosity the original large wound and now a satellite area which I think was above his initial satellite areas. Not a wonderful surface on this satellite area surrounding erythema which looks like pressure related. ooHis left lateral calf wound again his original wound is just about closed ooLeft heel pressure injury still requiring debridement ooLeft first second toe looks a lot better using silver alginate ooRight first second toe also using silver alginate and ketoconazole  cream also looks better 04/20/18; the patient was worked in early today out of concerns with his cellulitis on the right leg. I had started him on doxycycline. This was 2 days ago. His wife was concerned about the swelling in the area. Also concerned about the left buttock. He has not been systemically unwell no fever chills. No nausea vomiting or diarrhea 04/25/18; the patient's left buttock wound is continued to deteriorate he is using Hydrofera Blue. He is still completing clindamycin for the cellulitis on the right leg although all of this looks better. 05/02/18 ooLeft buttock wound still with a lot of drainage and a very tightly adherent fibrinous necrotic surface. He has a deeper area superiorly ooThe left lateral calf wound is still closed ooDTI wound on the left heel necrotic surface especially the circumference using Iodoflex ooAreas between his left first second toe and right first second toe both look better. Dorsally and the right first second toe he had a necrotic surface although at smaller. In using silver alginate and ketoconazole. I did a culture last week which was a deep tissue culture of the reminiscence of the open wound on the right first second toe dorsally. This grew a few Acinetobacter and a few methicillin-resistant staph aureus. Nevertheless the area actually this week looked better. I didn't feel the need to specifically address this at least in terms of systemic antibiotics. 05/09/18; wounds are measuring larger more drainage per our intake. We are using Santyl covered with alginate on the large superficial buttock wounds, Iodosorb on the left heel, ketoconazole and silver alginate to the dorsal first and second toes bilaterally. 05/16/18; ooThe area on his left buttock better in some aspects although the area superiorly over the ischial tuberosity required an extensive debridement.using Santyl ooLeft heel appears stable. Using Iodoflex ooThe areas between his first  and second toes are not bad however there is spreading erythema up the dorsal aspect of his left foot this looks like cellulitis again. He is insensate the erythema is really very brilliant.o Erysipelas He went to see an allergist days ago because he was itching part of this he had lab work done. This showed a white count of 15.1 with 70% neutrophils. Hemoglobin of 11.4 and a platelet count of 659,000. Last white count we had in Epic was a 2-1/2 years ago which was 25.9 but he was ill at the time. He was able to show me some lab work that was done by his primary physician the pattern is about the same. 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 But  prompted me to go ahead and do x-rays of both feet and the pelvis rule out osteomyelitis. He also had a comprehensive metabolic panel this was reasonably normal his albumin was 3.7 liver function tests BUN/creatinine all normal 05/23/18; x-rays of both his feet from last week were negative for underlying pulmonary abnormality. The x-ray of his pelvis however showed mild irregularity in the left ischial which may represent some early osteomyelitis. The wound in the left ischial continues to get deeper clearly now exposed muscle. Each week necrotic surface material over this area. Whereas the rest of the wounds do not look so bad. ooThe left ischial wound we have been using Santyl and calcium alginate ooT the left heel surface necrotic debris using Iodoflex o ooThe left lateral leg is still healed ooAreas on the left dorsal foot and the right dorsal foot are about the same. There is some inflammation on the left which might represent contact dermatitis, fungal dermatitis I am doubtful cellulitis although this looks better than last week 05/30/18; CT scan done at Hospital did not show any osteomyelitis or abscess. Suggested the possibility of underlying cellulitis although I don't see a lot of evidence of this at  the bedside ooThe wound itself on the left buttock/upper thigh actually looks somewhat better. No debridement ooLeft heel also looks better no debridement continue Iodoflex ooBoth dorsal first second toe spaces appear better using Lotrisone. Left still required debridement 06/06/18; ooIntake reported some purulent looking drainage from the left gluteal wound. Using Santyl and calcium alginate ooLeft heel looks better although still a nonviable surface requiring debridement ooThe left dorsal foot first/second webspace actually expanding and somewhat deeper. I may consider doing a shave biopsy of this area ooRight dorsal foot first/second webspace appears stable to improved. Using Lotrisone and silver alginate to both these areas 06/13/18 ooLeft gluteal surface looks better. Now separated in the 2 wounds. No debridement required. Still drainage. We'll continue silver alginate ooLeft heel continues to look better with Iodoflex continue this for at least another week ooOf his dorsal foot wounds the area on the left still has some depth although it looks better than last week. We've been using Lotrisone and silver alginate 06/20/18 ooLeft gluteal continues to look better healthy tissue ooLeft heel continues to look better healthy granulation wound is smaller. He is using Iodoflex and his long as this continues continue the Iodoflex ooDorsal right foot looks better unfortunately dorsal left foot does not. There is swelling and erythema of his forefoot. He had minor trauma to this several days ago but doesn't think this was enough to have caused any tissue injury. Foot looks like cellulitis, we have had this problem before 06/27/18 on evaluation today patient appears to be doing a little worse in regard to his foot ulcer. Unfortunately it does appear that he has methicillin-resistant staph aureus and unfortunately there really are no oral options for him as he's allergic to sulfa drugs as well as  I box. Both of which would really be his only options for treating this infection. In the past he has been given and effusion of Orbactiv. This is done very well for him in the past again it's one time dosing IV antibiotic therapy. Subsequently I do believe this is something we're gonna need to see about doing at this point in time. Currently his other wounds seem to be doing somewhat better in my pinion I'm pretty happy in that regard. 07/03/18 on evaluation today patient's wounds actually appear to be doing fairly  well. He has been tolerating the dressing changes without complication. All in all he seems to be showing signs of improvement. In regard to the antibiotics he has been dealing with infectious disease since I saw him last week as far as getting this scheduled. In the end [facility]'s [facility] this coming Friday. In the meantime he has been continuing to perform the dressing changes in such as previous. There does not appear to be any evidence of infection worsengin at this time. 07/10/18; ooSince I last saw this man 2 weeks ago things have actually improved. IV antibiotics of resulted in less forefoot erythema although there is still some present. He is not systemically unwell ooLeft buttock wounds o2 now have no depth there is increased epithelialization Using silver alginate ooLeft heel still requires debridement using Iodoflex ooLeft dorsal foot still with a sizable wound about the size of a border but healthy granulation ooRight dorsal foot still with a slitlike area using silver alginate 07/18/18; the patient's cellulitis in the left foot is improved in fact I think it is on its way to resolving. ooLeft buttock wounds o2 both look better although the larger one has hypertension granulation we've been using silver alginate ooLeft heel has some thick circumferential redundant skin over the wound edge which will need to be  removed today we've been using Iodoflex ooLeft dorsal foot is still a sizable wound required debridement using silver alginate ooThe right dorsal foot is just about closed only a small open area remains here 07/25/18; left foot cellulitis is resolved ooLeft buttock wounds o2 both look better. Hyper-granulation on the major area ooLeft heel as some debris over the surface but otherwise looks a healthier wound. Using silver collagen ooRight dorsal foot is just about closed 07/31/18; arrives with our intake nurse worried about purulent drainage from the buttock. We had hyper-granulation here last week ooHis buttock wounds o2 continue to look better ooLeft heel some debris over the surface but measuring smaller. ooRight dorsal foot unfortunately has openings between the toes ooLeft foot superficial wound looks less aggravated. 08/07/18 ooButtock wounds continue to look better although some of her granulation and the larger medial wound. silver alginate ooLeft heel continues to look a lot better.silver collagen ooLeft foot superficial wound looks less stable. Requires debridement. He has a new wound superficial area on the foot on the lateral dorsal foot. ooRight foot looks better using silver alginate without Lotrisone 08/14/2018; patient was in the ER last week diagnosed with a UTI. He is now on Cefpodoxime and Macrodantin. ooButtock wounds continued to be smaller. Using silver alginate ooLeft heel continues to look better using silver collagen ooLeft foot superficial wound looks as though it is improving ooRight dorsal foot area is just about healed. 08/21/2018; patient is completed his antibiotics for his UTI. ooHe has 2 open areas on the buttocks. There is still not closed although the surface looks satisfactory. Using silver alginate ooLeft heel continues to improve using silver collagen ooThe bilateral dorsal foot areas which are at the base of his first and second  toes/possible tinea pedis are actually stable on the left but worse on the right. The area on the left required debridement of necrotic surface. After debridement I obtained a specimen for PCR culture. ooThe right dorsal foot which is been just about healed last week is now reopened 08/28/2018; culture done on the left dorsal foot showed coag negative staph both staph epidermidis and  Lugdunensis. I think this is worthwhile initiating systemic treatment. I will use doxycycline given his long list of allergies. The area on the left heel slightly improved but still requiring debridement. ooThe large wound on the buttock is just about closed whereas the smaller one is larger. Using silver alginate in this area 09/04/2018; patient is completing his doxycycline for the left foot although this continues to be a very difficult wound area with very adherent necrotic debris. We are using silver alginate to all his wounds right foot left foot and the small wounds on his buttock, silver collagen on the left heel. 09/11/2018; once again this patient has intense erythema and swelling of the left forefoot. Lesser degrees of erythema in the right foot. He has a long list of allergies and intolerances. I will reinstitute doxycycline. 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 Using silver alginate ooLeft heel also looks better using silver collagen ooUnfortunately both the areas on his feet look worse. The area on the left first second webspace is now gone through to the plantar part of his foot. The area on the left foot anteriorly is irritated with erythema and swelling in the forefoot. 09/25/2018 ooHis wound on the left plantar heel looks better. Using silver collagen ooThe area on the left buttock 2 small remnant areas. One is closed one is still open. Using silver alginate ooThe areas between both his first and second toes look worse. This in spite of  long-standing antifungal therapy with ketoconazole and silver alginate which should have antifungal activity ooHe has small areas around his original wound on the left calf one is on the bottom of the original scar tissue and one superiorly both of these are small and superficial but again given wound history in this site this is worrisome 10/02/2018 ooLeft plantar heel continues to gradually contract using silver collagen ooLeft buttock wound is unchanged using silver alginate ooThe areas on his dorsal feet between his first and second toes bilaterally look about the same. 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 Small open area here. We have been using silver alginate 10/09/2018; ooLeft plantar heel continues to look healthy and contract. No debridement is required ooLeft buttock slightly smaller with a tape injury wound just below which was new this week ooDorsal feet somewhat improved I have been using clindamycin ooLeft lateral looks lower extremity the actual open area looks worse although a lot of this is epithelialized. I am going to change to silver collagen today He has a lot more swelling in the right leg although this is not pitting not red and not particularly warm there is a lot of spasm in the right leg usually indicative of people with paralysis of some underlying discomfort. We have reviewed his vascular status from 2017 he had a left greater saphenous vein ablation. I wonder about referring him back to vascular surgery if the area on the left leg continues to deteriorate. 10/16/2018 in today for follow-up and management of multiple lower extremity ulcers. His left Buttock wound is much lower smaller and almost closed completely. The wound to the left ankle has began to reopen with  Epithelialization and some adherent slough. He has multiple new areas to the left foot and leg. The left dorsal foot without much improvement. Wound  present between left great webspace and 2nd toe. Erythema and edema present right leg. Right LE ultrasound obtained on 10/10/18 was negative for DVT . 10/23/2018; ooLeft buttock is closed over. Still dry macerated skin but there is no open wound. I suspect this is chronic pressure/moisture ooLeft lateral calf is quite a bit worse than when I saw this last. There is clearly drainage here he has macerated skin into the left plantar heel. We will change the primary dressing to alginate ooLeft dorsal foot has some improvement in overall wound area. Still using clindamycin and silver alginate ooRight dorsal foot about the same as the left using clindamycin and silver alginate ooThe erythema in the right leg has resolved. He is DVT rule out was negative ooLeft heel pressure area required debridement although the wound is smaller and the surface is health 10/26/2018 ooThe patient came back in for his nurse check today predominantly because of the drainage coming out of the left lateral leg with a recent reopening of his original wound on the left lateral calf. He comes in today with a large amount of surrounding erythema around the wound extending from the calf into the ankle and even in the area on the dorsal foot. He is not systemically unwell. He is not febrile. Nevertheless this looks like cellulitis. We have been using silver alginate to the area. I changed him to a regular visit and I am going to prescribe him doxycycline. The rationale here is a long list of medication intolerances and a history of MRSA. I did not see anything that I thought would provide a valuable culture 10/30/2018 ooFollow-up from his appointment 4 days ago with really an extensive area of cellulitis in the left calf left lateral ankle and left dorsal foot. I put him  on doxycycline. He has a long list of medication allergies which are true allergy reactions. 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 In any case he is a lot better today. The erythema especially in the anterior and lateral left calf is better. He still has left ankle erythema. He also is complaining about increasing edema in the right leg we have only been using Kerlix Coban and he has been doing the wraps at home. Finally he has a spotty rash on the medial part of his upper left calf which looks like folliculitis or perhaps wrap occlusion type injury. Small superficial macules not pustules 11/06/18 patient arrives today with again a considerable degree of erythema around the wound on the left lateral calf extending into the dorsal ankle and dorsal foot. This is a lot worse than when I saw this last week. He is on doxycycline really with not a lot of improvement. He has not been systemically unwell Wounds on the; left heel actually looks improved. Original area on the left foot and proximity to the first and second toes looks about the same. He has superficial areas on the dorsal foot, anterior calf and then the reopening of his original wound on the left lateral calf which looks about the same ooThe only area he has on the right is the dorsal webspace first and second which is smaller. ooHe has a large area of dry erythematous skin on the left buttock small open area here. 11/13/2018; the patient arrives in much better condition. The erythema around the wound on the left lateral calf is a lot better. Not sure whether this was the clindamycin or the TCA  and ketoconazole or just in the improvement in edema control [stasis dermatitis]. In any case this is a lot better. The area on the left heel is very small and just about resolved using silver collagen we have been using silver alginate to the areas on his dorsal  feet 11/20/2018; his wounds include the left lateral calf, left heel, dorsal aspects of both feet just proximal to the first second webspace. He is stable to slightly improved. I did not think any changes to his dressings were going to be necessary 11/27/2018 he has a reopening on the left buttock which is surrounded by what looks like tinea or perhaps some other form of dermatitis. The area on the left dorsal foot has some erythema around it I have marked this area but I am not sure whether this is cellulitis or not. Left heel is not closed. Left calf the reopening is really slightly longer and probably worse 1/13; in general things look better and smaller except for the left dorsal foot. Area on the left heel is just about closed, left buttock looks better only a small wound remains in the skin looks better [using Lotrisone] 1/20; the area on the left heel only has a few remaining open areas here. Left lateral calf about the same in terms of size, left dorsal foot slightly larger right lateral foot still not closed. The area on the left buttock has no open wound and the surrounding skin looks a lot better 1/27; the area on the left heel is closed. Left lateral calf better but still requiring extensive debridements. The area on his left buttock is closed. He still has the open areas on the left dorsal foot which is slightly smaller in the right foot which is slightly expanded. We have been using Iodoflex on these areas as well 2/3; left heel is closed. Left lateral calf still requiring debridement using Iodoflex there is no open area on his left buttock however he has dry scaly skin over a large area of this. Not really responding well to the Lotrisone. Finally the areas on his dorsal feet at the level of the first second webspace are slightly smaller on the right and about the same on the left. Both of these vigorously debrided with Anasept and gauze 2/10; left heel remains closed he has dry  erythematous skin over the left buttock but there is no open wound here. Left lateral leg has come in and with. Still requiring debridement we have been using Iodoflex here. Finally the area on the left dorsal foot and right dorsal foot are really about the same extremely dry callused fissured areas. He does not yet have a dermatology appointment 2/17; left heel remains closed. He has a new open area on the left buttock. The area on the left lateral calf is bigger longer and still covered in necrotic debris. No major change in his foot areas bilaterally. I am awaiting for a dermatologist to look on this. We have been using ketoconazole I do not know that this is been doing any good at all. 2/24; left heel remains closed. The left buttock wound that was new reopening last week looks better. The left lateral calf appears better also although still requires debridement. The major area on his foot is the left first second also requiring debridement. We have been putting Prisma on all wounds. I do not believe that the ketoconazole has done too much good for his feet. He will use Lotrisone I am going to give  him a 2-week course of terbinafine. We still do not have a dermatology appointment 3/2 left heel remains closed however there is skin over bone in this area I pointed this out to him today. The left buttock wound is epithelialized but still does not look completely stable. The area on the left leg required debridement were using silver collagen here. With regards to his feet we changed to Lotrisone last week and silver alginate. 3/9; left heel remains closed. Left buttock remains closed. The area on the right foot is essentially closed. The left foot remains unchanged. Slightly smaller on the left lateral calf. Using silver collagen to both of these areas 3/16-Left heel remains closed. Area on right foot is closed. Left lateral calf above the lateral malleolus open wound requiring debridement with easy  bleeding. Left dorsal wound proximal to first toe also debrided. Left ischial area open new. Patient has been using Prisma with wrapping every 3 days. Dermatology appointment is apparently tomorrow.Patient has completed his terbinafine 2-week course with some apparent improvement according to him, there is still flaking and dry skin in his foot on the left 3/23; area on the right foot is reopened. The area on the left anterior foot is about the same still a very necrotic adherent surface. He still has the area on the left leg and reopening is on the left buttock. He apparently saw dermatology although I do not have a note. According to the patient who is usually fairly well informed they did not have any good ideas. Put him on oral terbinafine which she is been on before. 3/30; using silver collagen to all wounds. Apparently his dermatologist put him on doxycycline and rifampin presumably some culture grew staph. I do not have this result. He remains on terbinafine although I have used terbinafine on him before 4/6; patient has had a fairly substantial reopening on the right foot between the first and second toes. He is finished his terbinafine and I believe is on doxycycline and rifampin still as prescribed by dermatology. We have been using silver collagen to all his wounds although the patient reports that he thinks silver alginate does better on the wounds on his buttock. 4/13; the area on his left lateral calf about the same size but it did not require debridement. ooLeft dorsal foot just proximal to the webspace between the first and second toes is about the same. Still nonviable surface. I note some superficial bronze discoloration of the dorsal part of his foot ooRight dorsal foot just proximal to the first and second toes also looks about the same. I still think there may be the same discoloration I noted above on the left ooLeft buttock wound looks about the same 4/20; left lateral  calf appears to be gradually contracting using silver collagen. 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 The left dorsal foot wound is debrided of tightly adherent necrotic debris and really cleans up quite nicely. The right area is worse with expansion. I did not debride this it is now over the base of the second toe ooThe area on his left buttock is smaller no debridement is required using silver collagen 5/4; left calf continues to make good progress. ooHe arrives with erythema around the wounds on his dorsal foot which even extends to the plantar aspect. Very concerning for coexistent infection. He is finished the erythromycin I gave him for possible erythrasma this does not seem to have helped. ooThe area on the left  foot is about the same base of the dorsal toes ooIs area on the buttock looks improved on the left 5/11; left calf and left buttock continued to make good progress. Left foot is about the same to slightly improved. ooMajor problem is on the right foot. He has not had an x-ray. Deep tissue culture I did last week showed both Enterobacter and E. coli. I did not change the doxycycline I put him on empirically although neither 1 of these were plated to doxycycline. He arrives today with the erythema looking worse on both the dorsal and plantar foot. Macerated skin on the bottom of the foot. he has not been systemically unwell 5/18-Patient returns at 1 week, left calf wound appears to be making some progress, left buttock wound appears slightly worse than last time, left foot wound looks slightly better, right foot redness is marginally better. X-ray of both feet show no air or evidence of osteomyelitis. Patient is finished his Omnicef and terbinafine. He continues to have macerated skin on the bottom of the left foot as well as right 5/26; left calf wound is better, left buttock wound appears to have multiple small  superficial open areas with surrounding macerated skin. X-rays that I did last time showed no evidence of osteomyelitis in either foot. He is finished cefdinir and doxycycline. I do not think that he was on terbinafine. He continues to have a large superficial open area on the right foot anterior dorsal and slightly between the first and second toes. I did send him to dermatology 2 months ago or so wondering about whether they would do a fungal scraping. I do not believe they did but did do a culture. We have been using silver alginate to the toe areas, he has been using antifungals at home topically either ketoconazole or Lotrisone. We are using silver collagen on the left foot, silver alginate on the right, silver collagen on the left lateral leg and silver alginate on the left buttock 6/1; left buttock area is healed. We have the left dorsal foot, left lateral leg and right dorsal foot. We are using silver alginate to the areas on both feet and silver collagen to the area on his left lateral calf 6/8; the left buttock apparently reopened late last week. He is not really sure how this happened. He is tolerating the terbinafine. Using silver alginate to all wounds 6/15; left buttock wound is larger than last week but still superficial. ooCame in the clinic today with a report of purulence from the left lateral leg I did not identify any infection ooBoth areas on his dorsal feet appear to be better. He is tolerating the terbinafine. Using silver alginate to all wounds 6/22; left buttock is about the same this week, left calf quite a bit better. His left foot is about the same however he comes in with erythema and warmth in the right forefoot once again. Culture that I gave him in the beginning of May showed Enterobacter and E. coli. I gave him doxycycline and things seem to improve although neither 1 of these organisms was specifically plated. 6/29; left buttock is larger and dry this week. Left  lateral calf looks to me to be improved. Left dorsal foot also somewhat improved right foot completely unchanged. The erythema on the right foot is still present. He is completing the Ceftin dinner that I gave him empirically [see discussion above.) 7/6 - All wounds look to be stable and perhaps improved, the left buttock wound is  slightly smaller, per patient bleeds easily, completed ceftin, the right foot redness is less, he is on terbinafine 7/13; left buttock wound about the same perhaps slightly narrower. Area on the left lateral leg continues to narrow. Left dorsal foot slightly smaller right foot about the same. We are using silver alginate on the right foot and Hydrofera Blue to the areas on the left. Unna boot on the left 2 layer compression on the right 7/20; left buttock wound absolutely the same. Area on lateral leg continues to get better. Left dorsal foot require debridement as did the right no major change in the 7/27; left buttock wound the same size necrotic debris over the surface. The area on the lateral leg is closed once again. His left foot looks better right foot about the same although there is some involvement now of the posterior first second toe area. He is still on terbinafine which I have given him for a month, not certain a centimeter major change 06/25/19-All wounds appear to be slightly improved according to report, left buttock wound looks clean, both foot wounds have minimal to no debris the right dorsal foot has minimal slough. We are using Hydrofera Blue to the left and silver alginate to the right foot and ischial wound. 8/10-Wounds all appear to be around the same, the right forefoot distal part has some redness which was not there before, however the wound looks clean and small. Ischial wound looks about the same with no changes 8/17; his wound on the left lateral calf which was his original chronic venous insufficiency wound remains closed. Since I last saw him  the areas on the left dorsal foot right dorsal foot generally appear better but require debridement. The area on his left initial tuberosity appears somewhat larger to me perhaps hyper granulated and bleeds very easily. We have been using Hydrofera Blue to the left dorsal foot and silver alginate to everything else 8/24; left lateral calf remains closed. The areas on his dorsal feet on the webspace of the first and second toes bilaterally both look better. The area on the left buttock which is the pressure ulcer stage II slightly smaller. I change the dressing to Hydrofera Blue to all areas 8/31; left lateral calf remains closed. The area on his dorsal feet bilaterally look better. Using Hydrofera Blue. Still requiring debridement on the left foot. No change in the left buttock pressure ulcers however 9/14; left lateral calf remains closed. Dorsal feet look quite a bit better than 2 weeks ago. Flaking dry skin also a lot better with the ammonium lactate I gave him 2 weeks ago. The area on the left buttock is improved. He states that his Roho cushion developed a leak and he is getting a new one, in the interim he is offloading this vigorously 9/21; left calf remains closed. Left heel which was a possible DTI looks better this week. He had macerated tissue around the left dorsal foot right foot looks satisfactory and improved left buttock wound. I changed his dressings to his feet to silver alginate bilaterally. Continuing Hydrofera Blue on the left buttock. 9/28 left calf remains closed. Left heel did not develop anything [possible DTI] dry flaking skin on the left dorsal foot. Right foot looks satisfactory. Improved left buttock wound. We are using silver alginate on his feet Hydrofera Blue on the buttock. I have asked him to go back to the Lotrisone on his feet including the wounds and surrounding areas 10/5; left calf remains closed. The areas  on the left and right feet about the same. A lot of  this is epithelialized however debris over the remaining open areas. He is using Lotrisone and silver alginate. The area on the left buttock using Hydrofera Blue 10/26. Patient has been out for 3 weeks secondary to Covid concerns. He tested negative but I think his wife tested positive. He comes in today with the left foot substantially worse, right foot about the same. Even more concerning he states that the area on his left buttock closed over but then reopened and is considerably deeper in one aspect than it was before [stage III wound] 11/2; left foot really about the same as last week. Quarter sized wound on the dorsal foot just proximal to the first second toes. Surrounding erythema with areas of denuded epithelium. This is not really much different looking. Did not look like cellulitis this time however. ooRight foot area about the same.. We have been using silver alginate alginate on his toes ooLeft buttock still substantial irritated skin around the wound which I think looks somewhat better. We have been using Hydrofera Blue here. 11/9; left foot larger than last week and a very necrotic surface. Right foot I think is about the same perhaps slightly smaller. Debris around the circumference also addressed. Unfortunately on the left buttock there is been a decline. Satellite lesions below the major wound distally and now a an additional one posteriorly we have been using Hydrofera Blue but I think this is a pressure issue 11/16; left foot ulcer dorsally again a very adherent necrotic surface. Right foot is about the same. Not much change in the pressure ulcer on his left buttock. 11/30; left foot ulcer dorsally basically the same as when I saw him 2 weeks ago. Very adherent fibrinous debris on the wound surface. Patient reports a lot of drainage as well. 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 We have been using Iodoflex, patient changed back  to alginate because of the drainage. Area on his right dorsal foot really looks benign with a healthier surface certainly a lot better than on the left. Left buttock wounds all improved using Hydrofera Blue 12/7; left dorsal foot again no improvement. Tightly adherent debris. PCR culture I did last week only showed likely skin contaminant. I have gone ahead and done a punch biopsy of this which is about the last thing in terms of investigations I can think to do. He has known venous insufficiency and venous hypertension and this could be the issue here. The area on the right foot is about the same left buttock slightly worse according to our intake nurse secondary to [facility] Blue sticking to the wound 12/14; biopsy of the left foot that I did last time showed changes that could be related to wound healing/chronic stasis dermatitis phenomenon no neoplasm. We have been using silver alginate to both feet. I change the one on the left today to Sorbact and silver alginate to his other 2 wounds 12/28; the patient arrives with the following problems; ooMajor issue is the dorsal left foot which continues to be a larger deeper wound area. Still with a completely nonviable surface ooParadoxically the area mirror image on the right on the right dorsal foot appears to be getting better. ooHe had some loss of dry denuded skin from the lower part of his original wound on the left lateral calf. Some of this area looked a little vulnerable and for this reason we put  him in wrap that on this side this week ooThe area on his left buttock is larger. He still has the erythematous circular area which I think is a combination of pressure, sweat. This does not look like cellulitis or fungal dermatitis 11/26/2019; -Dorsal left foot large open wound with depth. Still debris over the surface. Using Sorbact ooThe area on the dorsal right foot paradoxically has closed over [facility] has a reopening on the left ankle  laterally at the base of his original wound that extended up into the calf. This appears clean. ooThe left buttock wound is smaller but with very adherent necrotic debris over the surface. We have been using silver alginate here as well The patient had arterial studies done in 2017. He had biphasic waveforms at the dorsalis pedis and posterior tibial bilaterally. ABI in the left was 1.17. Digit waveforms were dampened. He has slight spasticity in the great toes I do not think a TBI would be possible 1/11; the patient comes in today with a sizable reopening between the first and second toes on the right. This is not exactly in the same location where we have been treating wounds previously. According to our intake nurse this was actually fairly deep but 0.6 cm. The area on the left dorsal foot looks about the same the surface is somewhat cleaner using Sorbact, his MRI is in 2 days. We have not managed yet to get arterial studies. The new reopening on the left lateral calf looks somewhat better using alginate. The left buttock wound is about the same using alginate 1/18; the patient had his ARTERIAL studies which were quite normal. ABI in the right at 1.13 with triphasic/biphasic waveforms on the left ABI 1.06 again with triphasic/biphasic waveforms. It would not have been possible to have done a toe brachial index because of spasticity. We have been using Sorbac to the left foot alginate to the rest of his wounds on the right foot left lateral calf and left buttock 1/25; arrives in clinic with erythema and swelling of the left forefoot worse over the first MTP area. This extends laterally dorsally and but also posteriorly. Still has an area on the left lateral part of the lower part of his calf wound it is eschared and clearly not closed. ooArea on the left buttock still with surrounding irritation and erythema. ooRight foot surface wound dorsally. The area between the right and first and second toes  appears better. 2/1; ooThe left foot wound is about the same. Erythema slightly better I gave him a week of doxycycline empirically ooRight foot wound is more extensive extending between the toes to the plantar surface ooLeft lateral calf really no open surface on the inferior part of his original wound however the entire area still looks vulnerable ooAbsolutely no improvement in the left buttock wound required debridement. 2/8; the left foot is about the same. Erythema is slightly improved I gave him clindamycin last week. ooRight foot looks better he is using Lotrimin and silver alginate ooHe has a breakdown in the left lateral calf. Denuded epithelium which I have removed ooLeft buttock about the same were using Hydrofera Blue 2/15; left foot is about the same there is less surrounding erythema. Surface still has tightly adherent debris which I have debriding however not making any progress ooRight foot has a substantial wound on the medial right second toe between the first and second webspace. ooStill an open area on the left lateral calf distal area. ooButtock wound is about the same  2/22; left foot is about the same less surrounding erythema. Surface has adherent debris. Polymen Ag Right foot area significant wound between the first and second toes. We have been using silver alginate here Left lateral leg polymen Ag at the base of his original venous insufficiency wound ooLeft buttock some improvement here 3/1; ooRight foot is deteriorating in the first second toe webspace. Larger and more substantial. We have been using silver alginate. ooLeft dorsal foot about the same markedly adherent surface debris using PolyMem Ag ooLeft lateral calf surface debris using PolyMem AG ooLeft buttock is improved again using PolyMem Ag. ooHe is completing his terbinafine. The erythema in the foot seems better. He has been on this for 2 weeks 3/8; no improvement in any wound area in  fact he has a small open area on the dorsal midfoot which is new this week. He has not gotten his foot x-rays yet 3/15; his x-rays were both negative for osteomyelitis of both feet. No major change in any of his wounds on the extremities however his buttock wounds are better. We have been using polymen on the buttocks, left lower leg. Iodoflex on the left foot and silver alginate on the right 3/22; arrives in clinic today with the 2 major issues are the improvement in the left dorsal foot wound which for once actually looks healthy with a nice healthy wound surface without debridement. Using Iodoflex here. Unfortunately on the left lateral calf which is in the distal part of his original wound he came to the clinic here for there was purulent drainage noted some increased breakdown scattered around the original area and a small area proximally. We we are using polymen here will change to silver alginate today. His buttock wound on the left is better and I think the area on the right first second toe webspace is also improved 3/29; left dorsal foot looks better. Using Iodoflex. Left ankle culture from deterioration last time grew E. coli, Enterobacter and Enterococcus. I will give him a course of cefdinir although that will not cover Enterococcus. The area on the right foot in the webspace of the first and second toe lateral first toe looks better. The area on his buttock is about healed Vascular appointment is on April 21. This is to look at his venous system vis--vis continued breakdown of the wounds on the left including the left lateral leg and left dorsal foot he. He has had previous ablations on this side 4/5; the area between the right first and second toes lateral aspect of the first toe looks better. Dorsal aspect of the left first toe on the left foot also improved. Unfortunately the left lateral lower leg is larger and there is a second satellite wound superiorly. The usual superficial  abrasions on the left buttock overall better but certainly not closed 4/12; the area between the right first and second toes is improved. Dorsal aspect of the left foot also slightly smaller with a vibrant healthy looking surface. No real change in the left lateral leg and the left buttock wound is healed He has an unaffordable co-pay for Apligraf. Appointment with vein and vascular with regards to the left leg venous part of the circulation is on 4/21 4/19; we continue to see improvement in all wound areas. Although this is minor. He has his vascular appointment on 4/21. The area on the left buttock has not reopened although right in the center of this area the skin looks somewhat threatened 4/26; the left buttock is  unfortunately reopened. In general his left dorsal foot has a healthy surface and looks somewhat smaller although it was not measured as such. The area between his first and second toe webspace on the right as a small wound against the first toe. The patient saw vascular surgery. The real question I was asking was about the small saphenous vein on the left. He has previously ablated left greater saphenous vein. Nothing further was commented on on the left. Right greater saphenous vein without reflux at the saphenofemoral junction or proximal thigh there was no indication for ablation of the right greater saphenous vein duplex was negative for DVT bilaterally. They did not think there was anything from a vascular surgery point of view that could be offered. They ABIs within normal limits 5/3; only small open area on the left buttock. The area on the left lateral leg which was his original venous reflux is now 2 wounds both which look clean. We are using Iodoflex on the left dorsal foot which looks healthy and smaller. He is down to a very tiny area between the right first and second toes, using silver alginate 5/10; all of his wounds appear better. We have much better edema control in 4  layer compression on the left. This may be the factor that is allowing the left foot and left lateral calf to heal. He has external compression garments at home 04/14/20-All of his wounds are progressing well, the left forefoot is practically closed, left ischium appears to be about the same, right toe webspace is also smaller. The left lateral leg is about the same, continue using Hydrofera Blue to this, silver alginate to the ischium, Iodoflex to the toe space on the right 6/7; most of his wounds outside of the left buttock are doing well. The area on the left lateral calf and left dorsal foot are smaller. The area on the right foot in between the first and second toe webspace is barely visible although he still says there is some drainage here is the only reason I did not heal this out. ooUnfortunately the area on the left buttock almost looks like he has a skin tear from tape. He has open wound and then a large flap of skin that we are trying to get adherence over an area just next to the remaining wound 6/21; 2 week follow-up. I believe is been here for nurse visits. Miraculously the area between his first and second toes on the left dorsal foot is closed over. Still open on the right first second web space. The left lateral calf has 2 open areas. Distally this is more superficial. The proximal area had a little more depth and required debridement of adherent necrotic material. His buttock wound is actually larger we have been using silver alginate here 6/28; the patient's area on the left foot remains closed. Still open wet area between the first and second toes on the right and also extending into the plantar aspect. We have been using silver alginate in this location. He has 2 areas on the left lower leg part of his original long wounds which I think are better. We have been using Hydrofera Blue here. Hydrofera Blue to the left buttock which is stable 7/12; left foot remains closed. Left ankle  is closed. May be a small area between his right first and second toes the only truly open area is on the left buttock. We have been using Hydrofera Blue here 7/19; patient arrives with marked deterioration especially  in the left foot and ankle. We did not put him in a compression wrap on the left last week in fact he wore his juxta lite stockings on either side although he does not have an underlying stocking. He has a reopening on the left dorsal foot, left lateral ankle and a new area on the right dorsal ankle. More worrisome is the degree of erythema on the left foot extending on the lateral foot into the lateral lower leg on the left 7/26; the patient had erythema and drainage from the lateral left ankle last week. Culture of this grew MRSA resistant to doxycycline and clindamycin which are the 2 antibiotics we usually use with this patient who has multiple antibiotic allergies including linezolid, trimethoprim sulfamethoxazole. I had give him an empiric doxycycline and he comes in the area certainly looks somewhat better although it is blotchy in his lower leg. He has not been systemically unwell. He has had areas on the left dorsal foot which is a reopening, chronic wounds on the left lateral ankle. Both of these I think are secondary to chronic venous insufficiency. The area between his first and second toes is closed as far as I can tell. He had a new wrap injury on the right dorsal ankle last week. Finally he has an area on the left buttock. We have been using silver alginate to everything except the left buttock we are using Hydrofera Blue 06/30/20-Patient returns at 1 week, has been given a sample dose pack of NUZYRA which is a tetracycline derivative [omadacycline], patient has completed those, we have been using silver alginate to almost all the wounds except the left ischium where we are using Hydrofera Blue all of them look better 8/16; since I last saw the patient he has been doing well.  The area on the left buttock, left lateral ankle and left foot are all closed today. He has completed the [place] I gave him last time and tolerated this well. He still has open areas on the right dorsal ankle and in the right first second toe area which we are using silver alginate. 8/23; we put him in his bilateral external compression stockings last week as he did not have anything open on either leg except for concerning area between the right first and second toe. He comes in today with an area on the left dorsal foot slightly more proximal than the original wound, the left lateral foot but this is actually a continuation of the area he had on the left lateral ankle from last time. As well he is opened up on the left buttock again. 8/30; comes in today with things looking a lot better. The area on the left lower ankle has closed down as has the left foot but with eschar in both areas. The area on the dorsal right ankle is also epithelialized. Very little remaining of the left buttock wound. We have been using silver alginate on all wound areas 9/13; the area in the first second toe webspace on the right has fully epithelialized. He still has some vulnerable epithelium on the right and the ankle and the dorsal foot. He notes weeping. He is using his juxta lite stocking. On the left again the left dorsal foot is closed left lateral ankle is closed. We went to the juxta lite stocking here as well. ooStill vulnerable in the left buttock although only 2 small open areas remain here 9/27; 2-week follow-up. We did not look at his left leg but the  patient says everything is closed. He is a bit disturbed by the amount of edema in his left foot he is using juxta lite stockings but asking about over the toes stockings which would be 30/40, will talk to him next time. According to him there is no open wound on either the left foot or the left ankle/calf He has an open area on the dorsal right calf which I  initially point a wrap injury. He has superficial remaining wound on the left ischial tuberosity been using silver alginate although he says this sticks to the wound 10/5; we gave him 2-week follow-up but he called yesterday expressing some concerns about his right foot right ankle and the left buttock. He came in early. There is still no open areas on the left leg and that still in his juxta lite stocking 10/11; he only has 1 small area on the left buttock that remains measuring millimeters 1 mm. Still has the same irritated skin in this area. We recommended zinc oxide when this eventually closes and pressure relief is meticulously is he can do this. He still has an area on the dorsal part of his right first through third toes which is a bit irritated and still open and on the dorsal ankle near the crease of the ankle. We have been using silver alginate and using his own stocking. He has nothing open on the left leg or foot 10/25; 2-week follow-up. Not nearly as good on the left buttock as I was hoping. For open areas with 5 looking threatened small. He has the erythematous irritated chronic skin in this area. oo1 area on the right dorsal ankle. He reports this area bleeds easily ooRight dorsal foot just proximal to the base of his toes ooWe have been using silver alginate. 11/8; 2-week follow-up. Left buttock is about the same although I do not think the wounds are in the same location we have been using silver alginate. I have asked him to use zinc oxide on the skin around the wounds. ooHe still has a small area on the right dorsal ankle he reports this bleeds easily ooRight dorsal foot just proximal to the base of the toes does not have anything open although the skin is very dry and scaly ooHe has a new opening on the nailbed of the left great toe. Nothing on the left ankle 11/29; 3-week follow-up. Left buttock has 2 open areas. And washing of these wounds today started bleeding easily.  Suggesting very friable tissue. We have been using silver alginate. Right dorsal ankle which I thought was initially a wrap injury we have been using silver alginate. Nothing open between the toes that I can see. He states the area on the left dorsal toe nailbed healed after the last visit in 2 or 3 days 12/13; 3-week follow-up. His left buttock now has 3 open areas but the original 2 areas are smaller using polymen here. Surrounding skin looks better. The right dorsal ankle is closed. He has a small opening on the right dorsal foot at the level of the third toe. In general the skin looks better here. He is wearing his juxta lite stocking on the left leg says there is nothing open 11/24/2020; 3 weeks follow-up. His left buttock still has the 3 open areas. We have been using polymen but due to lack of response he changed to [facility] area. Surrounding skin is dry erythematous and irritated looking. There is no evidence of infection either bacterial or fungal  however there is loss of surface epithelium ooHe still has very dry skin in his foot causing irritation and erythema on the dorsal part of his toes. This is not responded to prolonged courses of antifungal simply looks dry and irritated 1/24; left buttock area still looks about the same he was unable to find the triad ointment that we had suggested. The area on the right lower leg just above the dorsal ankle has reopened and the areas on the right foot between the first second and second third toes and scaling on the bottom of the foot has been about the same for quite some time now. been using silver alginate to all wound areas 2/7; left buttock wound looked quite good although not much smaller in terms of surface area surrounding skin looks better. Only a few dry flaking areas on the right foot in between the first and second toes the skin generally looks better here [ammonium lactate]. Finally the area on the right dorsal ankle is  closed 2/21; ooThere is no open area on the right foot even between the right first and second toe. Skin around this area dorsally and plantar aspects look better. ooHe has a reopening of the area on the right ankle just above the crease of the ankle dorsally. I continue to think that this is probably friction from spasms may be even this time with his stocking under the compression stockings. ooWounds on his left buttock look about the same there a couple of areas that have reopened. He has a total square area of loss of epithelialization. This does not look like infection it looks like a contact dermatitis but I just cannot determine to what 3/14; there is nothing on the right foot between the first and second toes this was carefully inspected under illumination. Some chronic irritation on the dorsal part of his foot from toes 1-3 at the base. Nothing really open here substantially. Still has an area on the right foot/ankle that is actually larger and hyper granulated. His buttock area on the left is just about closed however he has chronic inflammation with loss of the surface epithelial layer 3/28; 2-week follow-up. In clinic today with a new wound on the left anterior mid tibia. Says this happened about 2 weeks ago. He is not really sure how wonders about the spasticity of his legs at night whether that could have caused this other than that he does not have a good idea. He has been using topical antibiotics and silver alginate. The area on his right dorsal ankle seems somewhat better. ooFinally everything on his left buttock is closed. 4/11; 2-week follow-up. All of his wounds are better except for the area over the ischium and left buttock which have opened up widely again. At least part of this is covered in necrotic fibrinous material another part had rolled nonviable skin. The area on the right ankle, left anterior mid tibia are both a lot better. He had no open wounds on either foot  including the areas between the first and second toes 4/25; patient presents for 2-week follow-up. He states that the wounds are overall stable. He has no complaints today and states he is using Hydrofera Blue to open wounds. 5/9; have not seen this man in over a month. For my memory he has open areas on the left mid tibia and right ankle. T oday he has new open area on the right dorsal foot which we have not had a problem with recently. He has  the sustained area on the left buttock He is also changed his insurance at the beginning of the year [place]. We will need prior authorizations for debridement 5/23; patient presents for 2-week follow-up. He has prior authorizations for debridement. He denies any issues in the past 2 weeks with his wound care. He has been using Hydrofera Blue to all the wounds. He does report a circular rash to the upper left leg that is new. He denies acute signs of infection. 6/6; 2-week follow-up. The patient has open wounds on the left buttock which are worse than the last time I saw this about a month ago. He also has a new area to me on the left anterior mid tibia with some surrounding erythema. The area on the dorsal ankle on the right is closed but I think this will be a friction injury every time this area is exposed to either our wraps or his compression stockings caused by unrelenting spasms in this leg. 6/20; 2-week follow-up. oo The patient has open wounds on the left buttock which is about the same. Using [facility] here. - The left mid tibia has a static amount of surrounding erythema. Also a raised area in the center. We have been using Hydrofera Blue here. ooooFinally he has broken down in his dorsal right foot extending between the first and second toes and going to the base of the first and second toe webspace. I have previously assumed that this was severe venous hypertension 7/5; 2-week follow-up oo The left buttock wound actually looks  better. We are using Hydrofera Blue. He has extensive skin irritation around this area and I have not really been able to get that any better. I have tried Lotrisone i.e. antifungals and steroids. More most recently we have just been using Coloplast really looks about the same. oo The left mid tibia which was new last week culture to have very resistant staph aureus. Not only methicillin-resistant but doxycycline resistant. The patient has a plethora of antibiotic allergies including sulfa, linezolid. I used topical bacitracin on this but he has not started this yet. oo In addition he has an expanding area of erythema with a wound on the dorsal right foot. I did a deep tissue culture of this area today 7/12; oo Left buttock area actually looks better surrounding skin also looks less irritated. oo Left mid tibia looks about the same. He is using bacitracin this is not worse oo Right dorsal foot looks about the same as well. oo The left first toe also looks about the same Objective Constitutional Sitting or standing Blood Pressure is within target range for patient.. Pulse regular and within target range for patient.Marland Kitchen Respirations regular, non-labored and within target range.. Temperature is normal and within the target range for the patient.Marland Kitchen Appears in no distress. Vitals Time Taken: 7:47 AM, Height: 70 in, Weight: 216 lbs, BMI: 31, Temperature: 98.4 F, Pulse: 118 bpm, Respiratory Rate: 18 breaths/min, Blood Pressure: 129/73 mmHg. General Notes: Wound exam oo Right dorsal foot the erythema actually looks better than last week. He has a lot of debris on the surface of his foot just proximal to his toes but the erythema seems better oo The left anterior tibia looks about the same as last week oo Left first toe dorsally looks clean. No debridement required oo Left buttock wound looks somewhat better surrounding erythema also improved Integumentary (Hair, Skin) Wound #41R status is Open.  Original cause of wound was Gradually Appeared. The date acquired was:  03/16/2020. The wound has been in treatment 63 weeks. The wound is located on the Left Ischium. The wound measures 1cm length x 0.5cm width x 0.1cm depth; 0.393cm^2 area and 0.039cm^3 volume. There is Fat Layer (Subcutaneous Tissue) exposed. There is no tunneling or undermining noted. There is a medium amount of sanguinous drainage noted. The wound margin is distinct with the outline attached to the wound base. There is large (67-100%) red, pink, friable granulation within the wound bed. There is no necrotic tissue within the wound bed. Wound #51 status is Open. Original cause of wound was Trauma. The date acquired was: 01/26/2021. The wound has been in treatment 15 weeks. The wound is located on the Left,Anterior Lower Leg. The wound measures 1.2cm length x 0.7cm width x 0.1cm depth; 0.66cm^2 area and 0.066cm^3 volume. There is Fat Layer (Subcutaneous Tissue) exposed. There is no tunneling or undermining noted. There is a medium amount of serosanguineous drainage noted. The wound margin is distinct with the outline attached to the wound base. There is large (67-100%) red, pink granulation within the wound bed. There is a small (1-33%) amount of necrotic tissue within the wound bed including Adherent Slough. Wound #52 status is Open. Original cause of wound was Gradually Appeared. The date acquired was: 03/27/2021. The wound has been in treatment 9 weeks. The wound is located on the Right,Dorsal Foot. The wound measures 1.5cm length x 3cm width x 0.1cm depth; 3.534cm^2 area and 0.353cm^3 volume. There is Fat Layer (Subcutaneous Tissue) exposed. There is no tunneling or undermining noted. There is a medium amount of serosanguineous drainage noted. The wound margin is distinct with the outline attached to the wound base. There is large (67-100%) red, pink granulation within the wound bed. There is no necrotic tissue within the wound  bed. Wound #53 status is Open. Original cause of wound was Gradually Appeared. The date acquired was: 05/11/2021. The wound has been in treatment 3 weeks. The wound is located on the Right,Plantar T Second. The wound measures 1cm length x 0.4cm width x 0.1cm depth; 0.314cm^2 area and 0.031cm^3 volume. oe There is Fat Layer (Subcutaneous Tissue) exposed. There is no tunneling or undermining noted. There is a medium amount of serosanguineous drainage noted. The wound margin is distinct with the outline attached to the wound base. There is large (67-100%) pink granulation within the wound bed. There is no necrotic tissue within the wound bed. Wound #54 status is Open. Original cause of wound was Pressure Injury. The date acquired was: 05/11/2021. The wound has been in treatment 3 weeks. The wound is located on the Left,Distal Ischium. The wound measures 1.8cm length x 0.6cm width x 0.1cm depth; 0.848cm^2 area and 0.085cm^3 volume. There is Fat Layer (Subcutaneous Tissue) exposed. There is no tunneling or undermining noted. There is a medium amount of sanguinous drainage noted. The wound margin is distinct with the outline attached to the wound base. There is large (67-100%) red, friable granulation within the wound bed. There is no necrotic tissue within the wound bed. Wound #55 status is Open. Original cause of wound was Blister. The date acquired was: 05/26/2021. The wound has been in treatment 1 weeks. The wound is located on the Left T Great. The wound measures 2cm length x 0.5cm width x 0.1cm depth; 0.785cm^2 area and 0.079cm^3 volume. There is Fat Layer oe (Subcutaneous Tissue) exposed. There is no tunneling or undermining noted. There is a medium amount of serosanguineous drainage noted. The wound margin is distinct with the outline  attached to the wound base. There is large (67-100%) red, pink, friable granulation within the wound bed. There is no necrotic tissue within the wound  bed. 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 [facility], Wed, or Thurs with Dr. Dellia Nims Bathing/ Shower/ Hygiene: May shower and wash wound with soap and water. - on days that dressing is changed Edema Control - Lymphedema / SCD / Other: Elevate legs to the level of the heart or above for 30 minutes daily and/or when sitting, a frequency of: - throughout the day Compression stocking or Garment 30-40 mm/Hg pressure to: - Juxtalite to both legs daily Off-Loading: Roho cushion for wheelchair Turn and reposition every 2 hours WOUND #41R: - Ischium Wound Laterality: Left Cleanser: Soap and Water Every Other Day/30 Days Discharge Instructions: May shower and wash wound with dial antibacterial soap and water prior to dressing change. Peri-Wound Care: Triamcinolone 15 (g) Every Other Day/30 Days Discharge Instructions: Use triamcinolone 15 (g) mixed with zinc oxide Peri-Wound Care: Zinc Oxide Ointment 30g tube Every Other Day/30 Days Discharge Instructions: Apply Zinc Oxide mixed with Triamcinolone to periwound with each dressing change Peri-Wound Care: Triad Hydrophilic Wound Dressing Tube, 6 (oz) Every Other Day/30 Days Discharge Instructions: Apply to periwound with each dressing change Prim Dressing: Hydrofera Blue Ready Foam, 2.5 x2.5 in Every Other Day/30 Days ary Discharge Instructions: Apply to wound bed as instructed Secondary Dressing: ComfortFoam Border, 4x4 in (silicone border) Every Other Day/30 Days Discharge Instructions: Apply over  primary dressing as directed. WOUND #51: - Lower Leg Wound Laterality: Left, Anterior Cleanser: Soap and Water Every Other Day/30 Days Discharge Instructions: May shower and wash wound with dial antibacterial soap and water prior to dressing change. Topical: Bacitracin Every Other Day/30 Days Prim Dressing: KerraCel Ag Gelling Fiber Dressing, 2x2 in (silver alginate) Every Other Day/30 Days ary Discharge Instructions: Apply silver alginate to wound bed as instructed Secondary Dressing: ComfortFoam Border, 4x4 in (silicone border) Every Other Day/30 Days Discharge Instructions: Apply over primary dressing as directed. WOUND #52: - Foot Wound Laterality: Dorsal, Right Cleanser: Soap and Water Every Other Day/30 Days Discharge Instructions: May shower and wash wound with dial antibacterial soap and water prior to dressing change. Prim Dressing: KerraCel Ag Gelling Fiber Dressing, 2x2 in (silver alginate) Every Other Day/30 Days ary Discharge Instructions: Apply silver alginate to wound bed as instructed Secondary Dressing: Woven Gauze Sponge, Non-Sterile 4x4 in Every Other Day/30 Days Discharge Instructions: Apply over primary dressing as directed. Secondary Dressing: Optifoam Non-Adhesive Dressing, 4x4 in Every Other Day/30 Days Discharge Instructions: Apply foam to dorsal ankle for protection Secured With: Kerlix Roll Sterile, 4.5x3.1 (in/yd) Every Other Day/30 Days Discharge Instructions: Secure with Kerlix as directed. Secured With: 75M Medipore H Soft Cloth Surgical T ape, 2x2 (in/yd) Every Other Day/30 Days Discharge Instructions: Secure dressing with tape as directed. WOUND #53: - T Second oe Wound Laterality: Plantar, Right Cleanser: Soap and Water Every Other Day/30 Days Discharge Instructions: May shower and wash wound with dial antibacterial soap and water prior to dressing change. Prim Dressing: KerraCel Ag Gelling Fiber Dressing, 2x2 in (silver alginate) Every Other Day/30  Days ary  Discharge Instructions: Apply silver alginate to wound bed as instructed Secondary Dressing: Woven Gauze Sponge, Non-Sterile 4x4 in Every Other Day/30 Days Discharge Instructions: Apply over primary dressing as directed. Secondary Dressing: Optifoam Non-Adhesive Dressing, 4x4 in Every Other Day/30 Days Discharge Instructions: Apply foam to dorsal ankle for protection Secured With: Kerlix Roll Sterile, 4.5x3.1 (in/yd) Every Other Day/30 Days Discharge Instructions: Secure with Kerlix as directed. Secured With: 55M Medipore H Soft Cloth Surgical T ape, 2x2 (in/yd) Every Other Day/30 Days Discharge Instructions: Secure dressing with tape as directed. WOUND #54: - Ischium Wound Laterality: Left, Distal Cleanser: Soap and Water Every Other Day/30 Days Discharge Instructions: May shower and wash wound with dial antibacterial soap and water prior to dressing change. Peri-Wound Care: Triamcinolone 15 (g) Every Other Day/30 Days Discharge Instructions: Use triamcinolone 15 (g) mixed with zinc oxide Peri-Wound Care: Zinc Oxide Ointment 30g tube Every Other Day/30 Days Discharge Instructions: Apply Zinc Oxide mixed with Triamcinolone to periwound with each dressing change Peri-Wound Care: Triad Hydrophilic Wound Dressing Tube, 6 (oz) Every Other Day/30 Days Discharge Instructions: Apply to periwound with each dressing change Prim Dressing: Hydrofera Blue Ready Foam, 2.5 x2.5 in Every Other Day/30 Days ary Discharge Instructions: Apply to wound bed as instructed Secondary Dressing: ComfortFoam Border, 4x4 in (silicone border) Every Other Day/30 Days Discharge Instructions: Apply over primary dressing as directed. 1. I am continue to use bacitracin on the left mid tibia on the right dorsal foot. 2. 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 The area on the left anterior mid tibia  MRSA was resistant to quinolones and clindamycin. Also allergic to sulfonamides and Zyvox 3. We could use Nuzyra here although I am doubtful that we will be able to get easily easy coverage from insurance 4. Still using silver alginate on his feet and Hydrofera Blue on the buttock wound Electronic Signature(s) Signed: 06/02/2021 5:25:56 PM By: Linton Ham MD Entered By: Linton Ham on 06/02/2021 12:51:10 -------------------------------------------------------------------------------- SuperBill Details Patient Name: Date of Service: [place], [name] 06/02/2021 Medical Record Number: [id number] Patient Account Number: [id number] Date of Birth/Sex: Treating RN: 1986/03/25 (35 y.o. 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, complete L97.821 Non-pressure chronic ulcer of other part of left lower leg limited to breakdown of skin Facility Procedures CPT4 Code: 62694854 Description: 62703 - WOUND CARE VISIT-LEV 5 EST PT Modifier: Quantity: 1 Physician Procedures : CPT4 Code Description Modifier [id number] - WC PHYS LEVEL 3 - EST PT ICD-10 Diagnosis Description L97.511 Non-pressure chronic ulcer of other part of right foot limited to breakdown of skin L97.511 Non-pressure chronic ulcer of other part of right  foot limited to breakdown of skin L97.821 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

## 2021-06-03 NOTE — Progress Notes (Signed)
[name] ([id number]) Visit Report for 06/02/2021 Arrival Information Details Patient Name: Date of Service: [name], [name] 06/02/2021 7:30 A M Medical Record Number: [id number] Patient Account Number: [id number] Date of Birth/Sex: Treating RN: 1986-02-10 (35 y.o. [name] Primary Care Tejah Brekke: [place], [place] Other Clinician: Referring Fleda Pagel: Treating Marka Treloar/Extender: [name]: 27 Visit Information History Since Last Visit Added or deleted any medications: No Patient Arrived: Wheel Chair Any new allergies or adverse reactions: No Arrival Time: 07:47 Had a fall or experienced change in No Transfer Assistance: Manual activities of daily living that may affect Patient Identification Verified: Yes risk of falls: Secondary Verification Process Completed: Yes Signs or symptoms of abuse/neglect since last visito No Patient Requires Transmission-Based Precautions: No Hospitalized since last visit: No Patient Has Alerts: Yes Implantable device outside of the clinic excluding No Patient Alerts: R ABI = 1.0 cellular tissue based products placed in the center L ABI = 1.1 since last visit: Has Dressing in Place as Prescribed: Yes Has Compression in Place as Prescribed: Yes Pain Present Now: No Electronic Signature(s) Signed: 06/02/2021 5:30:58 PM By: Lorrin Jackson Entered By: Lorrin Jackson on 06/02/2021 07:47:32 -------------------------------------------------------------------------------- Clinic Level of Care Assessment Details Patient Name: Date of Service: [name], [name] 06/02/2021 7:30 A M Medical Record Number: [id number] Patient Account Number: [id number] Date of Birth/Sex: Treating RN: [birth year] (35 y.o. [name] Primary Care [place]: [name], [name] Other Clinician: Referring Tineka Uriegas: Treating Muzamil Harker/Extender: [name]: 57 Clinic Level of Care Assessment  Items TOOL 4 Quantity Score X- 1 0 Use when only an EandM is performed on FOLLOW-UP visit ASSESSMENTS - Nursing Assessment / Reassessment X- 1 10 Reassessment of Co-morbidities (includes updates in patient status) X- 1 5 Reassessment of Adherence to Treatment Plan ASSESSMENTS - Wound and Skin A ssessment / Reassessment []  - 0 Simple Wound Assessment / Reassessment - one wound X- 6 5 Complex Wound Assessment / Reassessment - multiple wounds []  - 0 Dermatologic / Skin Assessment (not related to wound area) ASSESSMENTS - Focused Assessment X- 1 5 Circumferential Edema Measurements - multi extremities []  - 0 Nutritional Assessment / Counseling / Intervention []  - 0 Lower Extremity Assessment (monofilament, tuning fork, pulses) []  - 0 Peripheral Arterial Disease Assessment (using hand held doppler) ASSESSMENTS - Ostomy and/or Continence Assessment and Care []  - 0 Incontinence Assessment and Management []  - 0 Ostomy Care Assessment and Management (repouching, etc.) PROCESS - Coordination of Care []  - 0 Simple Patient / Family Education for ongoing care X- 1 20 Complex (extensive) Patient / Family Education for ongoing care X- 1 10 Staff obtains Programmer, systems, Records, T Results / Process Orders est []  - 0 Staff telephones HHA, Nursing Homes / Clarify orders / etc []  - 0 Routine Transfer to another Facility (non-emergent condition) []  - 0 Routine Hospital Admission (non-emergent condition) []  - 0 New Admissions / Biomedical engineer / Ordering NPWT Apligraf, etc. , []  - 0 Emergency Hospital Admission (emergent condition) []  - 0 Simple Discharge Coordination X- 1 15 Complex (extensive) Discharge Coordination PROCESS - Special Needs []  - 0 Pediatric / Minor Patient Management []  - 0 Isolation Patient Management []  - 0 Hearing / Language / Visual special needs []  - 0 Assessment of Community assistance (transportation, D/C planning, etc.) []  - 0 Additional  assistance / Altered mentation []  - 0 Support Surface(s) Assessment (bed, cushion, seat, etc.) INTERVENTIONS - Wound Cleansing / Measurement []  - 0 Simple Wound Cleansing -  one wound X- 6 5 Complex Wound Cleansing - multiple wounds X- 1 5 Wound Imaging (photographs - any number of wounds) []  - 0 Wound Tracing (instead of photographs) []  - 0 Simple Wound Measurement - one wound X- 6 5 Complex Wound Measurement - multiple wounds INTERVENTIONS - Wound Dressings []  - 0 Small Wound Dressing one or multiple wounds []  - 0 Medium Wound Dressing one or multiple wounds X- 6 20 Large Wound Dressing one or multiple wounds []  - 0 Application of Medications - topical []  - 0 Application of Medications - injection INTERVENTIONS - Miscellaneous []  - 0 External ear exam []  - 0 Specimen Collection (cultures, biopsies, blood, body fluids, etc.) []  - 0 Specimen(s) / Culture(s) sent or taken to Lab for analysis []  - 0 Patient Transfer (multiple staff / Civil Service fast streamer / Similar devices) []  - 0 Simple Staple / Suture removal (25 or less) []  - 0 Complex Staple / Suture removal (26 or more) []  - 0 Hypo / Hyperglycemic Management (close monitor of Blood Glucose) []  - 0 Ankle / Brachial Index (ABI) - do not check if billed separately X- 1 5 Vital Signs Has the patient been seen at the hospital within the last three years: Yes Total Score: 285 Level Of Care: New/Established - Level 5 Electronic Signature(s) Signed: 06/03/2021 6:12:45 PM By: Rhae Hammock RN Entered By: Rhae Hammock on 06/02/2021 08:33:52 -------------------------------------------------------------------------------- Encounter Discharge Information Details Patient Name: Date of Service: [name], [name] 06/02/2021 7:30 A M Medical Record Number: [id number] Patient Account Number: [id number] Date of Birth/Sex: Treating RN: [birth year] (35 y.o. [name] Primary Care Rola Lennon: [place], [place] Other  Clinician: Referring Elmina Hendel: Treating Kentrel Clevenger/Extender: [name]: 282 Encounter Discharge Information Items Discharge Condition: Stable Ambulatory Status: Wheelchair Discharge Destination: Home Transportation: Private Auto Schedule Follow-up Appointment: Yes Clinical Summary of Care: Provided on 06/02/2021 Form Type Recipient Paper Patient Patient Electronic Signature(s) Signed: 06/02/2021 8:58:40 AM By: Lorrin Jackson Entered By: Lorrin Jackson on 06/02/2021 08:58:39 -------------------------------------------------------------------------------- Lower Extremity Assessment Details Patient Name: Date of Service: Clausen, [name] 06/02/2021 7:30 A M Medical Record Number: [id number] Patient Account Number: [id number] Date of Birth/Sex: Treating RN: December 06, 1985 (35 y.o. [name] Primary Care Maysie Parkhill: [place], [place] Other Clinician: Referring Jashaun Penrose: Treating Jamoni Broadfoot/Extender: Dutch Gray, [name]: 282 Edema Assessment Assessed: [Left: Yes] [Right: Yes] Edema: [Left: Yes] [Right: Yes] Calf Left: Right: Point of Measurement: 33 cm From Medial Instep 26 cm 33 cm Ankle Left: Right: Point of Measurement: 10 cm From Medial Instep 23 cm 23.5 cm Vascular Assessment Pulses: Dorsalis Pedis Palpable: [Left:Yes] [Right:Yes] Electronic Signature(s) Signed: 06/02/2021 5:30:58 PM By: Lorrin Jackson Entered By: Lorrin Jackson on 06/02/2021 08:09:10 -------------------------------------------------------------------------------- Multi Wound Chart Details Patient Name: Date of Service: Schul, [name] 06/02/2021 7:30 A M Medical Record Number: [id number] Patient Account Number: [id number] Date of Birth/Sex: Treating RN: 08-20-1986 (35 y.o. [name] Primary Care Tyrin Herbers: [name], [name] Other Clinician: Referring Brooklynne Pereida: Treating Takiyah Bohnsack/Extender: Dutch Gray, [name] Weeks in  Treatment: 282 Vital Signs Height(in): 70 Pulse(bpm): 118 Weight(lbs): 216 Blood Pressure(mmHg): 129/73 Body Mass Index(BMI): 31 Temperature(F): 98.4 Respiratory Rate(breaths/min): 18 Photos: Left Ischium Left, Anterior Lower Leg Right, Dorsal Foot Wound Location: Gradually Appeared Trauma Gradually Appeared Wounding Event: Pressure Ulcer Trauma, Other Trauma, Other Primary Etiology: Sleep Apnea, Hypertension, Paraplegia Sleep Apnea, Hypertension, Paraplegia Sleep Apnea, Hypertension, Paraplegia Comorbid History: 03/16/2020 01/26/2021 03/27/2021 Date Acquired: 63 15 9 Weeks of Treatment: Open Open Open Wound  Status: Yes No No Wound Recurrence: Yes No No Clustered Wound: 4 N/A N/A Clustered Quantity: 1x0.5x0.1 1.2x0.7x0.1 1.5x3x0.1 Measurements L x W x D (cm) 0.393 0.66 3.534 A (cm) : rea 0.039 0.066 0.353 Volume (cm) : 78.20% -100.00% -87.50% % Reduction in A rea: 78.50% -100.00% -87.80% % Reduction in Volume: Category/Stage II Full Thickness Without Exposed Full Thickness Without Exposed Classification: Support Structures Support Structures Medium Medium Medium Exudate Amount: Sanguinous Serosanguineous Serosanguineous Exudate Type: red red, brown red, brown Exudate Color: Distinct, outline attached Distinct, outline attached Distinct, outline attached Wound Margin: Large (67-100%) Large (67-100%) Large (67-100%) Granulation Amount: Red, Pink, Friable Red, Pink Red, Pink Granulation Quality: None Present (0%) Small (1-33%) None Present (0%) Necrotic Amount: Fat Layer (Subcutaneous Tissue): Yes Fat Layer (Subcutaneous Tissue): Yes Fat Layer (Subcutaneous Tissue): Yes Exposed Structures: Fascia: No Fascia: No Fascia: No Tendon: No Tendon: No Tendon: No Muscle: No Muscle: No Muscle: No Joint: No Joint: No Joint: No Bone: No Bone: No Bone: No Medium (34-66%) Small (1-33%) None Epithelialization: Wound Number: [id number] Photos: Right, Plantar T  Second oe Left, Distal Ischium Left T Great oe Wound Location: Gradually Appeared Pressure Injury Blister Wounding 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 Primary Etiology: Sleep Apnea, Hypertension, Paraplegia Sleep Apnea, Hypertension, Paraplegia Sleep Apnea, Hypertension, Paraplegia Comorbid History: 05/11/2021 05/11/2021 05/26/2021 Date Acquired: 3 3 1  Weeks of Treatment: Open Open Open Wound Status: No No No Wound Recurrence: No No No Clustered Wound: N/A N/A N/A Clustered Quantity: 1x0.4x0.1 1.8x0.6x0.1 2x0.5x0.1 Measurements L x W x D (cm) 0.314 0.848 0.785 A (cm) : rea 0.031 0.085 0.079 Volume (cm) : 73.30% 40.00% 0.00% % Reduction in A rea: 73.70% 39.70% 0.00% % Reduction in Volume: Full Thickness Without Exposed Category/Stage III Full Thickness Without Exposed Classification: Support Structures Support Structures Medium Medium Medium Exudate Amount: Serosanguineous Sanguinous Serosanguineous Exudate Type: red, brown red red, brown Exudate Color: Distinct, outline attached Distinct, outline attached Distinct, outline attached Wound Margin: Large (67-100%) Large (67-100%) Large (67-100%) Granulation Amount: Pink Red, Friable Red, Pink, Friable Granulation Quality: None Present (0%) None Present (0%) None Present (0%) Necrotic Amount: Fat Layer (Subcutaneous Tissue): Yes Fat Layer (Subcutaneous Tissue): Yes Fat Layer (Subcutaneous Tissue): Yes Exposed Structures: Fascia: No Fascia: No Fascia: No Tendon: No Tendon: No Tendon: No Muscle: No Muscle: No Muscle: No Joint: No Joint: No Joint: No Bone: No Bone: No Bone: No Medium (34-66%) Medium (34-66%) None Epithelialization: Treatment Notes Wound #[id number] (Ischium) Wound Laterality: Left Cleanser Soap and Water Discharge Instruction: May shower and wash wound with dial antibacterial soap and water prior to dressing change. Peri-Wound Care Triamcinolone 15 (g) Discharge  Instruction: Use triamcinolone 15 (g) mixed with zinc oxide Zinc Oxide Ointment 30g tube Discharge Instruction: Apply Zinc Oxide mixed with Triamcinolone to periwound with each dressing change Triad Hydrophilic Wound Dressing Tube, 6 (oz) Discharge Instruction: Apply to periwound with each dressing change Topical Primary Dressing Hydrofera Blue Ready Foam, 2.5 x2.5 in Discharge Instruction: Apply to wound bed as instructed Secondary Dressing ComfortFoam Border, 4x4 in (silicone border) Discharge Instruction: Apply over primary dressing as directed. Secured With Compression Wrap Compression Stockings Add-Ons Wound #51 (Lower Leg) Wound Laterality: Left, Anterior Cleanser Soap and Water Discharge Instruction: May shower and wash wound with dial antibacterial soap and water prior to dressing change. Peri-Wound Care Topical Bacitracin Primary Dressing KerraCel Ag Gelling Fiber Dressing, 2x2 in (silver alginate) Discharge Instruction: Apply silver alginate to wound bed as instructed Secondary Dressing ComfortFoam Border, 4x4 in (  silicone border) Discharge Instruction: Apply over primary dressing as directed. Secured With Compression Wrap Compression Stockings Add-Ons Wound #52 (Foot) Wound Laterality: Dorsal, Right Cleanser Soap and Water Discharge Instruction: May shower and wash wound with dial antibacterial soap and water prior to dressing change. Peri-Wound Care Topical Primary Dressing KerraCel Ag Gelling Fiber Dressing, 2x2 in (silver alginate) Discharge Instruction: Apply silver alginate to wound bed as instructed Secondary Dressing Woven Gauze Sponge, Non-Sterile 4x4 in Discharge Instruction: Apply over primary dressing as directed. Optifoam Non-Adhesive Dressing, 4x4 in Discharge Instruction: Apply foam to dorsal ankle for protection Secured With Kerlix Roll Sterile, 4.5x3.1 (in/yd) Discharge Instruction: Secure with Kerlix as directed. 67M Medipore H Soft Cloth  Surgical T ape, 2x2 (in/yd) Discharge Instruction: Secure dressing with tape as directed. Compression Wrap Compression Stockings Add-Ons Wound #53 (Toe Second) Wound Laterality: Plantar, Right Cleanser Soap and Water Discharge Instruction: May shower and wash wound with dial antibacterial soap and water prior to dressing change. Peri-Wound Care Topical Primary Dressing KerraCel Ag Gelling Fiber Dressing, 2x2 in (silver alginate) Discharge Instruction: Apply silver alginate to wound bed as instructed Secondary Dressing Woven Gauze Sponge, Non-Sterile 4x4 in Discharge Instruction: Apply over primary dressing as directed. Optifoam Non-Adhesive Dressing, 4x4 in Discharge Instruction: Apply foam to dorsal ankle for protection Secured With Kerlix Roll Sterile, 4.5x3.1 (in/yd) Discharge Instruction: Secure with Kerlix as directed. 67M Medipore H Soft Cloth Surgical T ape, 2x2 (in/yd) Discharge Instruction: Secure dressing with tape as directed. Compression Wrap Compression Stockings Add-Ons Wound #54 (Ischium) Wound Laterality: Left, Distal Cleanser Soap and Water Discharge Instruction: May shower and wash wound with dial antibacterial soap and water prior to dressing change. Peri-Wound Care Triamcinolone 15 (g) Discharge Instruction: Use triamcinolone 15 (g) mixed with zinc oxide Zinc Oxide Ointment 30g tube Discharge Instruction: Apply Zinc Oxide mixed with Triamcinolone to periwound with each dressing change Triad Hydrophilic Wound Dressing Tube, 6 (oz) Discharge Instruction: Apply to periwound with each dressing change Topical Primary Dressing Hydrofera Blue Ready Foam, 2.5 x2.5 in Discharge Instruction: Apply to wound bed as instructed Secondary Dressing ComfortFoam Border, 4x4 in (silicone border) Discharge Instruction: Apply over primary dressing as directed. Secured With Compression Wrap Compression Stockings Add-Ons Wound #55 (Toe Great) Wound Laterality:  Left Cleanser Peri-Wound Care Topical Primary Dressing Secondary Dressing Secured With Compression Wrap Compression Stockings Add-Ons Electronic Signature(s) Signed: 06/03/2021 6:12:45 PM By: Rhae Hammock RN Previous Signature: 06/02/2021 5:25:56 PM Version By: Linton Ham MD Entered By: Rhae Hammock on 06/02/2021 17:29:59 -------------------------------------------------------------------------------- Multi-Disciplinary Care Plan Details Patient Name: Date of Service: Battaglia, [name] 06/02/2021 7:30 A M Medical Record Number: [id number] Patient Account Number: [id number] Date of Birth/Sex: Treating RN: 1986/01/01 (35 y.o. [name] Primary Care Sherri Mcarthy: [name], [name] Other Clinician: Referring Algie Cales: Treating Wildon Cuevas/Extender: [name]: [place] reviewed with physician Active Inactive Wound/Skin Impairment Nursing Diagnoses: Impaired tissue integrity Knowledge deficit related to ulceration/compromised skin integrity Goals: Patient/caregiver will verbalize understanding of skin care regimen Date Initiated: 01/05/2016 Target Resolution Date: 06/05/2021 Goal Status: Active Ulcer/skin breakdown will have a volume reduction of 30% by week 4 Date Initiated: 01/05/2016 Date Inactivated: 12/22/2017 Target Resolution Date: 01/19/2018 Unmet Reason: complex wounds, Goal Status: Unmet infection Interventions: Assess patient/caregiver ability to obtain necessary supplies Assess ulceration(s) every visit Provide education on ulcer and skin care Notes: 02/02/21: Complex Care, ongoing. Electronic Signature(s) Signed: 06/03/2021 6:12:45 PM By: Rhae Hammock RN Entered By: Rhae Hammock on 06/02/2021 07:57:57 -------------------------------------------------------------------------------- Pain Assessment Details Patient Name:  Date of Service: Tennyson, [name] 06/02/2021 7:30 A M Medical  Record Number: [id number] Patient Account Number: [id number] Date of Birth/Sex: Treating RN: Oct 18, 1986 (35 y.o. [name] Primary Care Vihaan Gloss: [place], [place] Other Clinician: Referring Johngabriel Verde: Treating Ziasia Lenoir/Extender: [name]: 282 Active Problems Location of Pain Severity and Description of Pain Patient Has Paino No Site Locations Pain Management and Medication Current Pain Management: Electronic Signature(s) Signed: 06/02/2021 5:30:58 PM By: Lorrin Jackson Entered By: Lorrin Jackson on 06/02/2021 07:49:18 -------------------------------------------------------------------------------- Patient/Caregiver Education Details Patient Name: Date of Service: Demartini, [name] 7/12/2022andnbsp7:30 A M Medical Record Number: [id number] Patient Account Number: [id number] Date of Birth/Gender: Treating RN: 05/24/1986 (35 y.o. [name] Primary Care Physician: Janine Limbo Other Clinician: Referring Physician: Treating Physician/Extender: [name]: 282 Education Assessment Education Provided To: Patient Education Topics Provided Wound/Skin Impairment: Methods: Explain/Verbal Responses: State content correctly Electronic Signature(s) Signed: 06/03/2021 6:12:45 PM By: Rhae Hammock RN Entered By: Rhae Hammock on 06/02/2021 07:58:13 -------------------------------------------------------------------------------- Wound Assessment Details Patient Name: Date of Service: Guild, [name] 06/02/2021 7:30 A M Medical Record Number: [id number] Patient Account Number: [id number] Date of Birth/Sex: Treating RN: [birth year] (35 y.o. [name] Primary Care Wreatha Sturgeon: [name], [name] Other Clinician: Referring Margit Batte: Treating Kora Groom/Extender: [name]: 282 Wound Status Wound Number: [id number] Primary Etiology: Pressure Ulcer Wound  Location: Left Ischium Wound Status: Open Wounding Event: Gradually Appeared Comorbid History: Sleep Apnea, Hypertension, Paraplegia Date Acquired: 03/16/2020 Weeks Of Treatment: 63 Clustered Wound: Yes Photos Photo Uploaded By: Sandre Kitty on 06/02/2021 16:18:21 Wound Measurements Length: (cm) 1 Width: (cm) 0.5 Depth: (cm) 0.1 Clustered Quantity: 4 Area: (cm) 0.393 Volume: (cm) 0.039 % Reduction in Area: 78.2% % Reduction in Volume: 78.5% Epithelialization: Medium (34-66%) Tunneling: No Undermining: No Wound Description Classification: Category/Stage II Wound Margin: Distinct, outline attached Exudate Amount: Medium Exudate Type: Sanguinous Exudate Color: red Foul Odor After Cleansing: No Slough/Fibrino No Wound Bed Granulation Amount: Large (67-100%) Exposed Structure Granulation Quality: Red, Pink, Friable Fascia Exposed: No Necrotic Amount: None Present (0%) Fat Layer (Subcutaneous Tissue) Exposed: Yes Tendon Exposed: No Muscle Exposed: No Joint Exposed: No Bone Exposed: No Treatment Notes Wound #41R (Ischium) Wound Laterality: Left Cleanser Soap and Water Discharge Instruction: May shower and wash wound with dial antibacterial soap and water prior to dressing change. Peri-Wound Care Triamcinolone 15 (g) Discharge Instruction: Use triamcinolone 15 (g) mixed with zinc oxide Zinc Oxide Ointment 30g tube Discharge Instruction: Apply Zinc Oxide mixed with Triamcinolone to periwound with each dressing change Triad Hydrophilic Wound Dressing Tube, 6 (oz) Discharge Instruction: Apply to periwound with each dressing change Topical Primary Dressing Hydrofera Blue Ready Foam, 2.5 x2.5 in Discharge Instruction: Apply to wound bed as instructed Secondary Dressing ComfortFoam Border, 4x4 in (silicone border) Discharge Instruction: Apply over primary dressing as directed. Secured With Compression Wrap Compression Stockings Sport and exercise psychologist) Signed: 06/02/2021 5:30:58 PM By: Lorrin Jackson Entered By: Lorrin Jackson on 06/02/2021 08:07:29 -------------------------------------------------------------------------------- Wound Assessment Details Patient Name: Date of Service: Feger, [name] 06/02/2021 7:30 A M Medical Record Number: [id number] Patient Account Number: [id number] Date of Birth/Sex: Treating RN: 25-Jan-1986 (35 y.o. [name] Primary Care Contrina Orona: [place], [place] Other Clinician: Referring Taron Conrey: Treating Ajay Strubel/Extender: [name]: 282 Wound Status Wound Number: [id number] Primary Etiology: Trauma, Other Wound Location: Left, Anterior Lower Leg Wound Status: Open Wounding Event: Trauma Comorbid History: Sleep Apnea, Hypertension, Paraplegia Date  Acquired: 01/26/2021 Weeks Of Treatment: 15 Clustered Wound: No Photos Photo Uploaded By: Sandre Kitty on 06/02/2021 16:16:51 Wound Measurements Length: (cm) 1.2 Width: (cm) 0.7 Depth: (cm) 0.1 Area: (cm) 0.66 Volume: (cm) 0.066 % Reduction in Area: -100% % Reduction in Volume: -100% Epithelialization: Small (1-33%) Tunneling: No Undermining: No Wound Description Classification: Full Thickness Without Exposed Support Structures Wound Margin: Distinct, outline attached Exudate Amount: Medium Exudate Type: Serosanguineous Exudate Color: red, brown Foul Odor After Cleansing: No Slough/Fibrino Yes Wound Bed Granulation Amount: Large (67-100%) Exposed Structure Granulation Quality: Red, Pink Fascia Exposed: No Necrotic Amount: Small (1-33%) Fat Layer (Subcutaneous Tissue) Exposed: Yes Necrotic Quality: Adherent Slough Tendon Exposed: No Muscle Exposed: No Joint Exposed: No Bone Exposed: No Treatment Notes Wound #51 (Lower Leg) Wound Laterality: Left, Anterior Cleanser Soap and Water Discharge Instruction: May shower and wash wound with dial antibacterial soap and water prior to dressing  change. Peri-Wound Care Topical Bacitracin Primary Dressing KerraCel Ag Gelling Fiber Dressing, 2x2 in (silver alginate) Discharge Instruction: Apply silver alginate to wound bed as instructed Secondary Dressing ComfortFoam Border, 4x4 in (silicone border) Discharge Instruction: Apply over primary dressing as directed. Secured With Compression Wrap Compression Stockings Environmental education officer) Signed: 06/02/2021 5:30:58 PM By: Lorrin Jackson Entered By: Lorrin Jackson on 06/02/2021 07:57:38 -------------------------------------------------------------------------------- Wound Assessment Details Patient Name: Date of Service: [place], [name] 06/02/2021 7:30 A M Medical Record Number: [id number] Patient Account Number: [id number] Date of Birth/Sex: Treating RN: 10-18-1986 (35 y.o. [name] Primary Care [place]: [name], [name] Other Clinician: Referring Kendrea Cerritos: Treating Zofia Peckinpaugh/Extender: [name]: 282 Wound Status Wound Number: [id number] Primary Etiology: Trauma, Other Wound Location: Right, Dorsal Foot Wound Status: Open Wounding Event: Gradually Appeared Comorbid History: Sleep Apnea, Hypertension, Paraplegia Date Acquired: 03/27/2021 Weeks Of Treatment: 9 Clustered Wound: No Photos Photo Uploaded By: Sandre Kitty on 06/02/2021 16:17:31 Wound Measurements Length: (cm) 1.5 Width: (cm) 3 Depth: (cm) 0.1 Area: (cm) 3.534 Volume: (cm) 0.353 % Reduction in Area: -87.5% % Reduction in Volume: -87.8% Epithelialization: None Tunneling: No Undermining: No Wound Description Classification: Full Thickness Without Exposed Support Structures Wound Margin: Distinct, outline attached Exudate Amount: Medium Exudate Type: Serosanguineous Exudate Color: red, brown Foul Odor After Cleansing: No Slough/Fibrino No Wound Bed Granulation Amount: Large (67-100%) Exposed Structure Granulation Quality: Red, Pink Fascia  Exposed: No Necrotic Amount: None Present (0%) Fat Layer (Subcutaneous Tissue) Exposed: Yes Tendon Exposed: No Muscle Exposed: No Joint Exposed: No Bone Exposed: No Treatment Notes Wound #52 (Foot) Wound Laterality: Dorsal, Right Cleanser Soap and Water Discharge Instruction: May shower and wash wound with dial antibacterial soap and water prior to dressing change. Peri-Wound Care Topical Primary Dressing KerraCel Ag Gelling Fiber Dressing, 2x2 in (silver alginate) Discharge Instruction: Apply silver alginate to wound bed as instructed Secondary Dressing Woven Gauze Sponge, Non-Sterile 4x4 in Discharge Instruction: Apply over primary dressing as directed. Optifoam Non-Adhesive Dressing, 4x4 in Discharge Instruction: Apply foam to dorsal ankle for protection Secured With Kerlix Roll Sterile, 4.5x3.1 (in/yd) Discharge Instruction: Secure with Kerlix as directed. 55M Medipore H Soft Cloth Surgical T ape, 2x2 (in/yd) Discharge Instruction: Secure dressing with tape as directed. Compression Wrap Compression Stockings Add-Ons Electronic Signature(s) Signed: 06/02/2021 5:30:58 PM By: Lorrin Jackson Entered By: Lorrin Jackson on 06/02/2021 08:00:01 -------------------------------------------------------------------------------- Wound Assessment Details Patient Name: Date of Service: Group, [name] 06/02/2021 7:30 A M Medical Record Number: [id number] Patient Account Number: [id number] Date of Birth/Sex: Treating RN: [birth year] (35 y.o. [name] Primary Care Lenora Gomes:  [place], [place] Other Clinician: Referring Aanshi Batchelder: Treating Mykaila Blunck/Extender: [name]: 282 Wound Status Wound Number: 53 Primary Etiology: Lymphedema Wound Location: Right, Plantar T Second oe Wound Status: Open Wounding Event: Gradually Appeared Comorbid History: Sleep Apnea, Hypertension, Paraplegia Date Acquired: 05/11/2021 Weeks Of Treatment: 3 Clustered  Wound: No Photos Photo Uploaded By: Sandre Kitty on 06/02/2021 16:16:52 Wound Measurements Length: (cm) 1 Width: (cm) 0.4 Depth: (cm) 0.1 Area: (cm) 0.314 Volume: (cm) 0.031 % Reduction in Area: 73.3% % Reduction in Volume: 73.7% Epithelialization: Medium (34-66%) Tunneling: No Undermining: No Wound Description Classification: Full Thickness Without Exposed Support Structures Wound Margin: Distinct, outline attached Exudate Amount: Medium Exudate Type: Serosanguineous Exudate Color: red, brown Foul Odor After Cleansing: No Slough/Fibrino No Wound Bed Granulation Amount: Large (67-100%) Exposed Structure Granulation Quality: Pink Fascia Exposed: No Necrotic Amount: None Present (0%) Fat Layer (Subcutaneous Tissue) Exposed: Yes Tendon Exposed: No Muscle Exposed: No Joint Exposed: No Bone Exposed: No Treatment Notes Wound #53 (Toe Second) Wound Laterality: Plantar, Right Cleanser Soap and Water Discharge Instruction: May shower and wash wound with dial antibacterial soap and water prior to dressing change. Peri-Wound Care Topical Primary Dressing KerraCel Ag Gelling Fiber Dressing, 2x2 in (silver alginate) Discharge Instruction: Apply silver alginate to wound bed as instructed Secondary Dressing Woven Gauze Sponge, Non-Sterile 4x4 in Discharge Instruction: Apply over primary dressing as directed. Optifoam Non-Adhesive Dressing, 4x4 in Discharge Instruction: Apply foam to dorsal ankle for protection Secured With Kerlix Roll Sterile, 4.5x3.1 (in/yd) Discharge Instruction: Secure with Kerlix as directed. 6M Medipore H Soft Cloth Surgical T ape, 2x2 (in/yd) Discharge Instruction: Secure dressing with tape as directed. Compression Wrap Compression Stockings Add-Ons Electronic Signature(s) Signed: 06/02/2021 5:30:58 PM By: Lorrin Jackson Entered By: Lorrin Jackson on 06/02/2021  08:01:19 -------------------------------------------------------------------------------- Wound Assessment Details Patient Name: Date of Service: Lamore, [name] 06/02/2021 7:30 A M Medical Record Number: [id number] Patient Account Number: [id number] Date of Birth/Sex: Treating RN: 04-Oct-1986 (35 y.o. [name] Primary Care Claudie Rathbone: [name], [name] Other Clinician: Referring Lailyn Appelbaum: Treating Zelda Reames/Extender: [name]: 282 Wound Status Wound Number: 87 Primary Etiology: Pressure Ulcer Wound Location: Left, Distal Ischium Wound Status: Open Wounding Event: Pressure Injury Comorbid History: Sleep Apnea, Hypertension, Paraplegia Date Acquired: 05/11/2021 Weeks Of Treatment: 3 Clustered Wound: No Photos Photo Uploaded By: Sandre Kitty on 06/02/2021 16:18:25 Wound Measurements Length: (cm) 1.8 Width: (cm) 0.6 Depth: (cm) 0.1 Area: (cm) 0.848 Volume: (cm) 0.085 % Reduction in Area: 40% % Reduction in Volume: 39.7% Epithelialization: Medium (34-66%) Tunneling: No Undermining: No Wound Description Classification: Category/Stage III Wound Margin: Distinct, outline attached Exudate Amount: Medium Exudate Type: Sanguinous Exudate Color: red Foul Odor After Cleansing: No Slough/Fibrino No Wound Bed Granulation Amount: Large (67-100%) Exposed Structure Granulation Quality: Red, Friable Fascia Exposed: No Necrotic Amount: None Present (0%) Fat Layer (Subcutaneous Tissue) Exposed: Yes Tendon Exposed: No Muscle Exposed: No Joint Exposed: No Bone Exposed: No Treatment Notes Wound #54 (Ischium) Wound Laterality: Left, Distal Cleanser Soap and Water Discharge Instruction: May shower and wash wound with dial antibacterial soap and water prior to dressing change. Peri-Wound Care Triamcinolone 15 (g) Discharge Instruction: Use triamcinolone 15 (g) mixed with zinc oxide Zinc Oxide Ointment 30g tube Discharge Instruction:  Apply Zinc Oxide mixed with Triamcinolone to periwound with each dressing change Triad Hydrophilic Wound Dressing Tube, 6 (oz) Discharge Instruction: Apply to periwound with each dressing change Topical Primary Dressing Hydrofera Blue Ready Foam, 2.5 x2.5 in Discharge Instruction: Apply to wound  bed as instructed Secondary Dressing ComfortFoam Border, 4x4 in (silicone border) Discharge Instruction: Apply over primary dressing as directed. Secured With Compression Wrap Compression Stockings Environmental education officer) Signed: 06/02/2021 5:30:58 PM By: Lorrin Jackson Entered By: Lorrin Jackson on 06/02/2021 08:02:23 -------------------------------------------------------------------------------- Wound Assessment Details Patient Name: Date of Service: Loss, [name] 06/02/2021 7:30 A M Medical Record Number: [id number] Patient Account Number: [id number] Date of Birth/Sex: Treating RN: 01/22/1986 (35 y.o. [name] Primary Care Addilyn Satterwhite: [place], [place] Other Clinician: Referring Aarsh Fristoe: Treating Tasia Liz/Extender: [place], [name]: 282 Wound Status Wound Number: 55 Primary Etiology: Neuropathic Ulcer-Non Diabetic Wound Location: Left T Great oe Wound Status: Open Wounding Event: Blister Comorbid History: Sleep Apnea, Hypertension, Paraplegia Date Acquired: 05/26/2021 Weeks Of Treatment: 1 Clustered Wound: No Photos Photo Uploaded By: Sandre Kitty on 06/02/2021 16:17:52 Wound Measurements Length: (cm) 2 Width: (cm) 0.5 Depth: (cm) 0.1 Area: (cm) 0.785 Volume: (cm) 0.079 % Reduction in Area: 0% % Reduction in Volume: 0% Epithelialization: None Tunneling: No Undermining: No Wound Description Classification: Full Thickness Without Exposed Support Struct Wound Margin: Distinct, outline attached Exudate Amount: Medium Exudate Type: Serosanguineous Exudate Color: red, brown ures Foul Odor After Cleansing:  No Slough/Fibrino No Wound Bed Granulation Amount: Large (67-100%) Exposed Structure Granulation Quality: Red, Pink, Friable Fascia Exposed: No Necrotic Amount: None Present (0%) Fat Layer (Subcutaneous Tissue) Exposed: Yes Tendon Exposed: No Muscle Exposed: No Joint Exposed: No Bone Exposed: No Treatment Notes Wound #55 (Toe Great) Wound Laterality: Left Cleanser Peri-Wound Care Topical Primary Dressing Secondary Dressing Secured With Compression Wrap Compression Stockings Add-Ons Electronic Signature(s) Signed: 06/02/2021 5:30:58 PM By: Lorrin Jackson Entered By: Lorrin Jackson on 06/02/2021 08:08:35 -------------------------------------------------------------------------------- Vitals Details Patient Name: Date of Service: Tuzzolino, [name] 06/02/2021 7:30 A M Medical Record Number: [id number] Patient Account Number: [id number] Date of Birth/Sex: Treating RN: [birth year] (35 y.o. [name] Primary Care Lipa Knauff: [place], [place] Other Clinician: Referring Marsa Matteo: Treating Mckynlie Vanderslice/Extender: [name]: 282 Vital Signs Time Taken: 07:47 Temperature (F): 98.4 Height (in): 70 Pulse (bpm): 118 Weight (lbs): 216 Respiratory Rate (breaths/min): 18 Body Mass Index (BMI): 31 Blood Pressure (mmHg): 129/73 Reference Range: 80 - 120 mg / dl Electronic Signature(s) Signed: 06/02/2021 5:30:58 PM By: Lorrin Jackson Entered By: Lorrin Jackson on 06/02/2021 07:49:01

## 2021-06-09 ENCOUNTER — Encounter (HOSPITAL_BASED_OUTPATIENT_CLINIC_OR_DEPARTMENT_OTHER): Payer: 59 | Admitting: Internal Medicine

## 2021-06-09 ENCOUNTER — Other Ambulatory Visit: Payer: Self-pay

## 2021-06-09 DIAGNOSIS — L89323 Pressure ulcer of left buttock, stage 3: Secondary | ICD-10-CM | POA: Diagnosis not present

## 2021-06-09 NOTE — Progress Notes (Signed)
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 sedation in appearance. I use an open curette today debridement in the surface of all of this especially the edges Small open wounds remaining on the dorsal right first and second toe  interspace and the plantar left first second toe and her face on the left 06/09/17; wound on the left lateral leg continues to be smaller but very gradual and very dry surface using Hydrofera Blue 06/16/17 requires weekly debridements now on the left lateral leg although this continues to contract. I changed to silver collagen last week because of dryness of the wound bed. Using Iodoflex to the areas on his first and second toes/web space bilaterally 06/24/17; patient with history of paraplegia also chronic venous insufficiency with lymphedema. Has [name] very difficult wound on the left lateral leg. This has been gradually reducing in terms of with but comes in with [name] very dry adherent surface. High switch to silver collagen [name] week or so ago with hydrogel to keep the area moist. This is been refractory to multiple dressing attempts. He also has areas in his first and second toes bilaterally in the anterior and posterior web space. I had been using Iodoflex here after [name] prolonged course of silver alginate with ketoconazole was ineffective [question tinea pedis] 07/14/17; patient arrives today with [name] very difficult adherent material over his left lateral lower leg wound. He also has surrounding erythema and poorly controlled edema. He was switched his Santyl last visit which the nurses are applying once during his doctor visit and once on [name] nurse visit. He was also reduced to 2 layer compression I'[name] not exactly sure of the issue here. 07/21/17; better surface today after 1 week of Iodoflex. Significant cellulitis that we treated last week also better. [Doxycycline] 07/28/17 better surface today with now 2 [name] of Iodoflex. Significant cellulitis treated with doxycycline.  He has now completed the doxycycline and he is back to his usual degree of chronic venous inflammation/stasis dermatitis. He reminds me he has had ablations surgery here 08/04/17; continued improvement with Iodoflex to the left lateral leg wound in  terms of the surface of the wound although the dimensions are better. 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 Problematic areas on the plantar aspect of the first second toe web space on the left and the dorsal aspect of the first second toe web space on the right. At one point I felt these were probably related to chronic fungal infections in treated him aggressively for this although we have not made any improvement here. 08/11/17; left lateral leg. Surface continues to improve with the Iodoflex although we are not seeing much improvement in overall wound dimensions. Areas on his plantar left foot and right foot show no improvement. In fact the right foot looks somewhat worse 08/18/17; left lateral leg. We changed to [facility] Blue last week after [name] prolonged course of Iodoflex which helps get the surface better. It appears that the wound with is improved. Continue with difficult areas on the left dorsal first second and plantar first second on the right 09/01/17; patient arrives in clinic today having had [name] temperature of 103 yesterday. He was seen in the ER and [facility]. The patient was concerned he could have cellulitis again in the right leg however they diagnosed him with [name] UTI and he is now on Keflex. He has [name] history of cellulitis which is been recurrent and difficult but this is been in the left leg, in the past 5 use doxycycline. He does in and out catheterizations at home which are risk factors for UTI 09/08/17; patient will be completing his Keflex this weekend. The erythema on the left leg is considerably better. He has [name] new wound today on the medial part of the right leg small superficial almost looks like [name] skin tear. He has worsening of the area on the right dorsal first and second toe. His major area on the left lateral leg is better. Using Hydrofera Blue on all areas 09/15/17; gradual reduction in width on the  long wound in the left lateral leg. No debridement required. He also has wounds on the plantar aspect of his left first second toe web space and on the dorsal aspect of the right first second toe web space. 09/22/17; there continues to be very gradual improvements in the dimensions of the left lateral leg wound. He hasn't round erythematous spot with might be pressure on his wheelchair. There is no evidence obviously of infection no purulence no warmth He has [name] dry scaled area on the plantar aspect of the left first second toe Improved area on the dorsal right first second toe. 09/29/17; left lateral leg wound continues to improve in dimensions mostly with an is still [name] fairly long but increasingly narrow wound. He has [name] dry scaled area on the plantar aspect of his left first second toe web space Increasingly concerning area on the dorsal right first second toe. In fact I am concerned today about possible cellulitis around this wound. The areas extending up his second toe and although there is deformities here almost appears to abut on the nailbed. 10/06/17; left lateral leg wound continues to make very gradual progress. Tissue culture I did from the right first second toe dorsal foot last time grew MRSA and enterococcus which was vancomycin  sensitive. This was not sensitive to clindamycin or doxycycline. He is allergic to Zyvox and sulfa we have therefore arrange for him to have dalvance infusion tomorrow. He is had this in the past and tolerated it well 10/20/17; left lateral leg wound continues to make decent progress. This is certainly reduced in terms of with there is advancing epithelialization.The cellulitis in the right foot looks better although he still has [name] deep wound in the dorsal aspect of the first second toe web space. Plantar left first toe web space on the left I think is making some progress 10/27/17; left lateral leg wound continues to make decent progress. Advancing  epithelialization.using Hydrofera Blue The right first second toe web space wound is better-looking using silver alginate Improvement in the left plantar first second toe web space. Again using silver alginate 11/03/17 left lateral leg wound continues to make decent progress albeit slowly. Using [facility] The right per second toe web space continues to be [name] very problematic looking punched out wound. I obtained [name] piece of tissue for deep culture I did extensively treated this for fungus. It is difficult to imagine that this is [name] pressure area as the patient states other than going outside he doesn't really wear shoes at home The left plantar first second toe web space looked fairly senescent. Necrotic edges. This required debridement change to [facility] Blue to all wound areas 11/10/17; left lateral leg wound continues to contract. Using Hydrofera Blue On the right dorsal first second toe web space dorsally. Culture I did of this area last week grew MRSA there is not an easy oral option in this patient was multiple antibiotic allergies or intolerances. This was only [name] rare culture isolate I'[name] therefore going to use Bactroban under silver alginate On the left plantar first second toe web space. Debridement is required here. This is also unchanged 11/17/17; left lateral leg wound continues to contract using Hydrofera Blue this is no longer the major issue. The major concern here is the right first second toe web space. He now has an open area going from dorsally to the plantar aspect. There is now wound on the inner lateral part of the first toe. Not [name] very viable surface on this. There is erythema spreading medially into the forefoot. No major change in the left first second toe plantar wound 11/24/17; left lateral leg wound continues to contract using Hydrofera Blue. Nice improvement today The right first second toe web space all of this looks [name] lot less angry than last week. I have given him  clindamycin and topical Bactroban for MRSA and terbinafine for the possibility of underlining tinea pedis that I could not control with ketoconazole. Looks somewhat better The area on the plantar left first second toe web space is weeping with dried debris around the wound 12/01/17; left lateral leg wound continues to contract he Hydrofera Blue. It is becoming thinner in terms of with nevertheless it is making good improvement. The right first second toe web space looks less angry but still [name] large necrotic-looking wounds starting on the plantar aspect of the right foot extending between the toes and now extensively on the base of the right second toe. I gave him clindamycin and topical Bactroban for MRSA anterior benefiting for the possibility of underlying tinea pedis. Not looking better today The area on the left first/second toe looks better. Debrided of necrotic debris 12/05/17* the patient was worked in urgently today because over the weekend he found blood on his  incontinence bad when he woke up. He was found to have an ulcer by his wife who does most of his wound care. He came in today for [place] to look at this. He has not had [name] history of wounds in his buttocks in spite of his paraplegia. 12/08/17; seen in follow-up today at his usual appointment. He was seen earlier this week and found to have [name] new wound on his buttock. We also follow him for wounds on the left lateral leg, left first second toe web space and right first second toe web space 12/15/17; we have been using Hydrofera Blue to the left lateral leg which has improved. The right first second toe web space has also improved. Left first second toe web space plantar aspect looks stable. The left buttock has worsened using Santyl. Apparently the buttock has drainage 12/22/17; we have been using Hydrofera Blue to the left lateral leg which continues to improve now 2 small wounds separated by normal skin. He tells [place] he had [name] fever up to 100  yesterday he is prone to UTIs but has not noted anything different. He does in and out catheterizations. The area between the first and second toes today does not look good necrotic surface covered with what looks to be purulent drainage and erythema extending into the third toe. I had gotten this to something that I thought look better last time however it is not look good today. He also has [name] necrotic surface over the buttock wound which is expanded. I thought there might be infection under here so I removed [name] lot of the surface with [name] #5 curet though nothing look like it really needed culturing. He is been using Santyl to this area 12/27/17; his original wound on the left lateral leg continues to improve using Hydrofera Blue. I gave him samples of Baxdella although he was unable to take them out of fear for an allergic reaction ["lump in his throat"].the culture I did of the purulent drainage from his second toe last week showed both enterococcus and [name] set Enterobacter I was also concerned about the erythema on the bottom of his foot although paradoxically although this looks somewhat better today. 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 We've been using silver alginate here. They came up today that he sleeps in [name] recliner, I'[name] not sure why but I asked him to stop this 01/03/18; his original wound we've been using Hydrofera Blue is now separated into 2 areas. Ulcer on his left buttock is better he is off the recliner and sleeping in bed Finally both wound areas between his first and second toes also looks some better 01/10/18; his original wound on the left lateral leg is now separated into 2 wounds we've been using Hydrofera Blue Ulcer on his left buttock has some drainage. There is [name] small probing site going into muscle layer superiorly.using silver alginate -He arrives today with [name] deep tissue injury on  the left heel The wound on the dorsal aspect of his first second toe on the left looks [name] lot betterusing silver alginate ketoconazole The area on the first second toe web space on the right also looks [name] lot bette 01/17/18; his original wound on the left lateral leg continues to progress using Hydrofera Blue Ulcer on his left buttock also is smaller surface healthier except for [name] small probing site going into the muscle layer superiorly.  2.4 cm of tunneling in this area DTI on his left heel we have only been offloading. Looks better than last week no threatened open no evidence of infection the wound on the dorsal aspect of the first second toe on the left continues to look like it's regressing we have only been using silver alginate and terbinafine orally The area in the first second toe web space on the right also looks to be [name] lot better using silver alginate and terbinafine I think this was prompted by tinea pedis 01/31/18; the patient was hospitalized in [place] last week apparently for [name] complicated UTI. He was discharged on cefepime he does in and out catheterizations. In the hospital he was discovered [name] I don't mild elevation of AST and ALT and the terbinafine was stopped.predictably the pressure ulcer on s his buttock looks betterusing silver alginate. The area on the left lateral leg also is better using Hydrofera Blue. The area between the first and second toes on the left better. First and second toes on the right still substantial but better. Finally the DTI on the left heel has held together and looks like it's resolving 02/07/18-he is here in follow-up evaluation for multiple ulcerations. He has new injury to the lateral aspect of the last issue [name] pressure ulcer, he states this is from adhesive removal trauma. He states he has tried multiple adhesive products with no success. All other ulcers appear stable. The left heel DTI is resolving. We will continue with same treatment plan and  follow-up next week. 02/14/18; follow-up for multiple areas. He has [name] new area last week on the lateral aspect of his pressure ulcer more over the posterior trochanter. The original pressure ulcer looks quite stable has healthy granulation. 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 Almost fully epithelialized on the original superior area using Hydrofera Blue DTI on the left heel has peeled off this week to reveal [name] small superficial wound under denuded skin and subcutaneous tissue Both areas between the first and second toes look better including nothing open on the left 02/21/18; The patient's wounds on his left ischial tuberosity and posterior left greater trochanter actually looked better. He has [name] large area of irritation around the area which I think is contact dermatitis. I am doubtful that this is fungal His original wound on the left lateral calf continues to improve we have been using Hydrofera Blue There is no open area in the left first second toe web space although there is [name] lot of thick callus The DTI on the left heel required debridement today of necrotic surface eschar and subcutaneous tissue using silver alginate Finally the area on the right first second toe webspace continues to contract using silver alginate and ketoconazole 02/28/18 Left ischial tuberosity wounds look better using silver alginate. Original wound on the left calf only has one small open area left using Hydrofera Blue DTI on the left heel required debridement mostly removing skin from around this wound surface. Using silver alginate The areas on the right first/second toe web space using silver alginate and ketoconazole 03/08/18 on evaluation today patient appears to be doing decently well as best I can tell in regard to his wounds. This is the first time that I have seen him as he generally is followed by Dr.  Dellia Nims. With that being said none of his wounds appear to be infected he does have an  area where there is some skin covering what appears to be [name] new wound on the left dorsal surface of his great toe. This is right at the nail bed. With that being said I do believe that debrided away some of the excess skin can be of benefit in this regard. Otherwise he has been tolerating the dressing changes without complication. 03/14/18; patient arrives today with the multiplicity of wounds that we are following. He has not been systemically unwell Original wound on the left lateral calf now only has 2 small open areas we've been using Hydrofera Blue which should continue The deep tissue injury on the left heel requires debridement today. We've been using silver alginate The left first second toe and the right first second toe are both are reminiscence what I think was tinea pedis. Apparently some of the callus Surface between the toes was removed last week when it started draining. Purulent drainage coming from the wound on the ischial tuberosity on the left. 03/21/18-He is here in follow-up evaluation for multiple wounds. 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 He tolerated debridement. The only change to last week's recommendations is to discontinue antifungal cream between toes. He will follow-up next week 03/28/18; following up for multiple wounds;Concern this week is streaking redness and swelling in the right foot. He is going to need antibiotics for this. 03/31/18; follow-up for right foot cellulitis. Streaking redness and swelling in the right foot on 03/28/18. He has multiple antibiotic intolerances and [name] history of MRSA. I put him on clindamycin 300 mg every 6 and brought him in for [name] quick check. He has an open wound between his first and second toes on the right foot as [name] potential source. 04/04/18; Right foot cellulitis is  resolving he is completing clindamycin. This is truly good news Left lateral calf wound which is initial wound only has one small open area inferiorly this is close to healing out. He has compression stockings. We will use Hydrofera Blue right down to the epithelialization of this Nonviable surface on the left heel which was initially pressure with [name] DTI. We've been using Hydrofera Blue. I'[name] going to switch this back to silver alginate Left first second toe/tinea pedis this looks better using silver alginate Right first second toe tinea pedis using silver alginate Large pressure ulcers on theLeft ischial tuberosity. Small wound here Looks better. I am uncertain about the surface over the large wound. Using silver alginate 04/11/18; Cellulitis in the right foot is resolved Left lateral calf wound which was his original wounds still has 2 tiny open areas remaining this is just about closed Nonviable surface on the left heel is better but still requires debridement Left first second toe/tinea pedis still open using silver alginate Right first second toe wound tinea pedis I asked him to go back to using ketoconazole and silver alginate Large pressure ulcers on the left ischial tuberosity this shear injury here is resolved. Wound is smaller. No evidence of infection using silver alginate 04/18/18; Patient arrives with an intense area of cellulitis in the right mid lower calf extending into the right heel area. Bright red and warm. Smaller area on the left anterior leg. He has [name] significant history of MRSA. He will definitely need antibioticsdoxycycline He now has 2 open areas on the left ischial tuberosity the original large wound and now [name] satellite area which I think was above his initial satellite areas. Not [name] wonderful surface  on this satellite area surrounding erythema which looks like pressure related. His left lateral calf wound again his original wound is just about closed Left heel pressure  injury still requiring debridement Left first second toe looks [name] lot better using silver alginate Right first second toe also using silver alginate and ketoconazole cream also looks better 04/20/18; the patient was worked in early today out of concerns with his cellulitis on the right leg. I had started him on doxycycline. This was 2 days ago. His wife was concerned about the swelling in the area. Also concerned about the left buttock. He has not been systemically unwell no fever chills. No nausea vomiting or diarrhea 04/25/18; the patient's left buttock wound is continued to deteriorate he is using Hydrofera Blue. He is still completing clindamycin for the cellulitis on the right leg although all of this looks better. 05/02/18 Left buttock wound still with [name] lot of drainage and [name] very tightly adherent fibrinous necrotic surface. He has [name] deeper area superiorly The left lateral calf wound is still closed DTI wound on the left heel necrotic surface especially the circumference using Iodoflex Areas between his left first second toe and right first second toe both look better. Dorsally and the right first second toe he had [name] necrotic surface although at smaller. In using silver alginate and ketoconazole. I did [name] culture last week which was [name] deep tissue culture of the reminiscence of the open wound on the right first second toe dorsally. This grew [name] few Acinetobacter and [name] few methicillin-resistant staph aureus. Nevertheless the area actually this week looked better. I didn't feel the need to specifically address this at least in terms of systemic antibiotics. 05/09/18; wounds are measuring larger more drainage per our intake. We are using Santyl covered with alginate on the large superficial buttock wounds, Iodosorb on the left heel, ketoconazole and silver alginate to the dorsal first and second toes bilaterally. 05/16/18; The area on his left buttock better in some aspects although the area superiorly  over the ischial tuberosity required an extensive debridement.using Santyl Left heel appears stable. Using Iodoflex The areas between his first and second toes are not bad however there is spreading erythema up the dorsal aspect of his left foot this looks like cellulitis again. He is insensate the erythema is really very brilliant.o Erysipelas He went to see an allergist days ago because he was itching part of this he had lab work done. This showed [name] white count of 15.1 with 70% neutrophils. Hemoglobin of 11.4 and [name] platelet count of 659,000. Last white count we had in Epic was [name] 2-1/2 years ago which was 25.9 but he was ill at the time. He was able to show me some lab work that was done by his primary physician the pattern is about the same. 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 But prompted me to go ahead and do x-rays of both feet and the pelvis rule out osteomyelitis. He also had [name] comprehensive metabolic panel this was reasonably normal his albumin was 3.7 liver function tests BUN/creatinine all normal 05/23/18; x-rays of both his feet from last week were negative for underlying pulmonary abnormality. The x-ray of his pelvis however showed mild irregularity in the left ischial which may represent some early osteomyelitis. The wound in the left ischial continues to get deeper clearly now exposed muscle. Each week necrotic surface material over this area. Whereas the rest of the  wounds do not look so bad. The left ischial wound we have been using Santyl and calcium alginate T the left heel surface necrotic debris using Iodoflex o The left lateral leg is still healed Areas on the left dorsal foot and the right dorsal foot are about the same. There is some inflammation on the left which might represent contact dermatitis, fungal dermatitis I am doubtful cellulitis although this looks better than last week 05/30/18; CT scan done at Hospital did not  show any osteomyelitis or abscess. Suggested the possibility of underlying cellulitis although I don't see [name] lot of evidence of this at the bedside The wound itself on the left buttock/upper thigh actually looks somewhat better. No debridement Left heel also looks better no debridement continue Iodoflex Both dorsal first second toe spaces appear better using Lotrisone. Left still required debridement 06/06/18; Intake reported some purulent looking drainage from the left gluteal wound. Using Santyl and calcium alginate Left heel looks better although still [name] nonviable surface requiring debridement The left dorsal foot first/second webspace actually expanding and somewhat deeper. I may consider doing [name] shave biopsy of this area Right dorsal foot first/second webspace appears stable to improved. Using Lotrisone and silver alginate to both these areas 06/13/18 Left gluteal surface looks better. Now separated in the 2 wounds. No debridement required. Still drainage. We'll continue silver alginate Left heel continues to look better with Iodoflex continue this for at least another week Of his dorsal foot wounds the area on the left still has some depth although it looks better than last week. We've been using Lotrisone and silver alginate 06/20/18 Left gluteal continues to look better healthy tissue Left heel continues to look better healthy granulation wound is smaller. He is using Iodoflex and his long as this continues continue the Iodoflex Dorsal right foot looks better unfortunately dorsal left foot does not. There is swelling and erythema of his forefoot. He had minor trauma to this several days ago but doesn't think this was enough to have caused any tissue injury. Foot looks like cellulitis, we have had this problem before 06/27/18 on evaluation today patient appears to be doing [name] little worse in regard to his foot ulcer. 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 Both of which would really be his only options for treating this infection. In the past he has been given and effusion of Orbactiv. This is done very well for him in the past again it's one time dosing IV antibiotic therapy. Subsequently I do believe this is something we're gonna need to see about doing at this point in time. Currently his other wounds seem to be doing somewhat better in my pinion I'[name] pretty happy in that regard. 07/03/18 on evaluation today patient's wounds actually appear to be doing fairly well. He has been tolerating the dressing changes without complication. All in all he seems to be showing signs of improvement. In regard to the antibiotics he has been dealing with infectious disease since I saw him last week as far as getting this scheduled. In the end [facility]'s [facility] this coming Friday. In the meantime he has been continuing to perform the dressing changes in such as previous. There does not appear to be any evidence of infection worsengin at this time. 07/10/18; Since I  last saw this man 2 [name] ago things have actually improved. IV antibiotics of resulted in less forefoot erythema although there is still some present. He is not systemically unwell Left buttock wounds 2 now have no depth there is increased epithelialization Using silver alginate Left heel still requires debridement using Iodoflex Left dorsal foot still with [name] sizable wound about the size of [name] border but healthy granulation Right dorsal foot still with [name] slitlike area using silver alginate 07/18/18; the patient's cellulitis in the left foot is improved in fact I think it is on its way to resolving. Left buttock wounds 2 both look better although the larger one has hypertension granulation we've been using silver alginate Left heel has some thick  circumferential redundant skin over the wound edge which will need to be removed today we've been using Iodoflex Left dorsal foot is still [name] sizable wound required debridement using silver alginate The right dorsal foot is just about closed only [name] small open area remains here 07/25/18; left foot cellulitis is resolved Left buttock wounds 2 both look better. Hyper-granulation on the major area Left heel as some debris over the surface but otherwise looks [name] healthier wound. Using silver collagen Right dorsal foot is just about closed 07/31/18; arrives with our intake nurse worried about purulent drainage from the buttock. We had hyper-granulation here last week His buttock wounds 2 continue to look better Left heel some debris over the surface but measuring smaller. Right dorsal foot unfortunately has openings between the toes Left foot superficial wound looks less aggravated. 08/07/18 Buttock wounds continue to look better although some of her granulation and the larger medial wound. silver alginate Left heel continues to look [name] lot better.silver collagen Left foot superficial wound looks less stable. Requires debridement. He has [name] new wound superficial area on the foot on the lateral dorsal foot. Right foot looks better using silver alginate without Lotrisone 08/14/2018; patient was in the ER last week diagnosed with [name] UTI. He is now on Cefpodoxime and Macrodantin. Buttock wounds continued to be smaller. Using silver alginate Left heel continues to look better using silver collagen Left foot superficial wound looks as though it is improving Right dorsal foot area is just about healed. 08/21/2018; patient is completed his antibiotics for his UTI. He has 2 open areas on the buttocks. There is still not closed although the surface looks satisfactory. Using silver alginate Left heel continues to improve using silver collagen The bilateral dorsal foot areas which are at the base of his first and second  toes/possible tinea pedis are actually stable on the left but worse on the right. The area on the left required debridement of necrotic surface. After debridement I obtained [name] specimen for PCR culture. The right dorsal foot which is been just about healed last week is now reopened 08/28/2018; culture done on the left dorsal foot showed coag negative staph both staph epidermidis and Lugdunensis. I think this is worthwhile initiating systemic treatment. I will use doxycycline given his long list of allergies. The area on the left heel slightly improved but still requiring debridement. The large wound on the buttock is just about closed whereas the smaller one is larger. Using silver alginate in this area 09/04/2018; patient is completing his doxycycline for the left foot although this continues to be [name] very difficult wound area with very adherent necrotic debris. We are using silver alginate to all his wounds right foot left foot and the small wounds on his buttock, silver  collagen on the left heel. 09/11/2018; once again this patient has intense erythema and swelling of the left forefoot. Lesser degrees of erythema in the right foot. He has [name] long list of allergies and intolerances. I will reinstitute doxycycline. 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 Using silver alginate Left heel also looks better using silver collagen Unfortunately both the areas on his feet look worse. The area on the left first second webspace is now gone through to the plantar part of his foot. The area on the left foot anteriorly is irritated with erythema and swelling in the forefoot. 09/25/2018 His wound on the left plantar heel looks better. Using silver collagen The area on the left buttock 2 small remnant areas. One is closed one is still open. Using silver alginate The areas between both his first and second toes look worse. This in spite of long-standing antifungal  therapy with ketoconazole and silver alginate which should have antifungal activity He has small areas around his original wound on the left calf one is on the bottom of the original scar tissue and one superiorly both of these are small and superficial but again given wound history in this site this is worrisome 10/02/2018 Left plantar heel continues to gradually contract using silver collagen Left buttock wound is unchanged using silver alginate The areas on his dorsal feet between his first and second toes bilaterally look about the same. I prescribed clindamycin ointment to see if we can address chronic staph colonization and also the underlying possibility of erythrasma The left lateral lower extremity wound is actually on the lateral part of his ankle. Small open area here. We have been using silver alginate 10/09/2018; Left plantar heel continues to look healthy and contract. No debridement is required Left buttock slightly smaller with [name] tape injury wound just below which was new this week Dorsal feet somewhat improved I have been using clindamycin Left lateral looks lower extremity the actual open area looks worse although [name] lot of this is epithelialized. I am going to change to silver collagen today He has [name] lot more swelling in the right leg although this is not pitting not red and not particularly warm there is [name] lot of spasm in the right leg usually indicative of people with paralysis of some underlying discomfort. We have reviewed his vascular status from 2017 he had [name] left greater saphenous vein ablation. I wonder about referring him back to vascular surgery if the area on the left leg continues to deteriorate. 10/16/2018 in today for follow-up and management of multiple lower extremity ulcers. His left Buttock wound is much lower smaller and almost closed completely. The wound to the left ankle has began to reopen with Epithelialization and some adherent slough. He has multiple  new areas to the left foot and leg. The left dorsal foot without much improvement. Wound present between left great webspace and 2nd toe. Erythema and edema present right leg. Right LE ultrasound obtained on 10/10/18 was negative for DVT . 10/23/2018; Left buttock is closed over. Still dry macerated skin but there is no open wound. I suspect this is chronic pressure/moisture Left lateral calf is quite [name] bit worse than when I saw this last. There is clearly drainage here he has macerated skin into the left plantar heel. We will change the primary dressing to alginate Left dorsal foot has some improvement in overall wound area. Still using clindamycin and silver alginate  Right dorsal foot about the same as the left using clindamycin and silver alginate The erythema in the right leg has resolved. He is DVT rule out was negative Left heel pressure area required debridement although the wound is smaller and the surface is health 10/26/2018 The patient came back in for his nurse check today predominantly because of the drainage coming out of the left lateral leg with [name] recent reopening of his original wound on the left lateral calf. He comes in today with [name] large amount of surrounding erythema around the wound extending from the calf into the ankle and even in the area on the dorsal foot. He is not systemically unwell. He is not febrile. Nevertheless this looks like cellulitis. We have been using silver alginate to the area. I changed him to [name] regular visit and I am going to prescribe him doxycycline. The rationale here is [name] long list of medication intolerances and [name] history of MRSA. I did not see anything that I thought would provide [name] valuable culture 10/30/2018 Follow-up from his appointment 4 days ago with really an extensive area of cellulitis in the left calf left lateral ankle and left dorsal foot. I put him on doxycycline. He has [name] long list of medication allergies which are true allergy reactions.  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 In any case he is [name] lot better today. The erythema especially in the anterior and lateral left calf is better. He still has left ankle erythema. He also is complaining about increasing edema in the right leg we have only been using Kerlix Coban and he has been doing the wraps at home. Finally he has [name] spotty rash on the medial part of his upper left calf which looks like folliculitis or perhaps wrap occlusion type injury. Small superficial macules not pustules 11/06/18 patient arrives today with again [name] considerable degree of erythema around the wound on the left lateral calf extending into the dorsal ankle and dorsal foot. This is [name] lot worse than when I saw this last week. He is on doxycycline really with not [name] lot of improvement. He has not been systemically unwell Wounds on the; left heel actually looks improved. Original area on the left foot and proximity to the first and second toes looks about the same. He has superficial areas on the dorsal foot, anterior calf and then the reopening of his original wound on the left lateral calf which looks about the same The only area he has on the right is the dorsal webspace first and second which is smaller. He has [name] large area of dry erythematous skin on the left buttock small open area here. 11/13/2018; the patient arrives in much better condition. The erythema around the wound on the left lateral calf is [name] lot better. Not sure whether this was the clindamycin or the TCA and ketoconazole or just in the improvement in edema control [stasis dermatitis]. In any case this is [name] lot better. The area on the left heel is very small and just about resolved using silver collagen we have been using silver alginate to the areas on his dorsal feet 11/20/2018; his wounds include the left lateral calf, left heel, dorsal aspects of both feet just proximal to  the first second webspace. He is stable to slightly improved. I did not think any changes to his dressings were going to be necessary 11/27/2018 he has [name] reopening on the  left buttock which is surrounded by what looks like tinea or perhaps some other form of dermatitis. The area on the left dorsal foot has some erythema around it I have marked this area but I am not sure whether this is cellulitis or not. Left heel is not closed. Left calf the reopening is really slightly longer and probably worse 1/13; in general things look better and smaller except for the left dorsal foot. Area on the left heel is just about closed, left buttock looks better only [name] small wound remains in the skin looks better [using Lotrisone] 1/20; the area on the left heel only has [name] few remaining open areas here. Left lateral calf about the same in terms of size, left dorsal foot slightly larger right lateral foot still not closed. The area on the left buttock has no open wound and the surrounding skin looks [name] lot better 1/27; the area on the left heel is closed. Left lateral calf better but still requiring extensive debridements. The area on his left buttock is closed. He still has the open areas on the left dorsal foot which is slightly smaller in the right foot which is slightly expanded. We have been using Iodoflex on these areas as well 2/3; left heel is closed. Left lateral calf still requiring debridement using Iodoflex there is no open area on his left buttock however he has dry scaly skin over [name] large area of this. Not really responding well to the Lotrisone. Finally the areas on his dorsal feet at the level of the first second webspace are slightly smaller on the right and about the same on the left. Both of these vigorously debrided with Anasept and gauze 2/10; left heel remains closed he has dry erythematous skin over the left buttock but there is no open wound here. Left lateral leg has come in and with.  Still requiring debridement we have been using Iodoflex here. Finally the area on the left dorsal foot and right dorsal foot are really about the same extremely dry callused fissured areas. He does not yet have [name] dermatology appointment 2/17; left heel remains closed. He has [name] new open area on the left buttock. The area on the left lateral calf is bigger longer and still covered in necrotic debris. No major change in his foot areas bilaterally. I am awaiting for [name] dermatologist to look on this. We have been using ketoconazole I do not know that this is been doing any good at all. 2/24; left heel remains closed. The left buttock wound that was new reopening last week looks better. The left lateral calf appears better also although still requires debridement. The major area on his foot is the left first second also requiring debridement. We have been putting Prisma on all wounds. I do not believe that the ketoconazole has done too much good for his feet. He will use Lotrisone I am going to give him [name] 2-week course of terbinafine. We still do not have [name] dermatology appointment 3/2 left heel remains closed however there is skin over bone in this area I pointed this out to him today. The left buttock wound is epithelialized but still does not look completely stable. The area on the left leg required debridement were using silver collagen here. With regards to his feet we changed to Lotrisone last week and silver alginate. 3/9; left heel remains closed. Left buttock remains closed. The area on the right foot is essentially closed. The left foot remains unchanged. Slightly  smaller on the left lateral calf. Using silver collagen to both of these areas 3/16-Left heel remains closed. Area on right foot is closed. Left lateral calf above the lateral malleolus open wound requiring debridement with easy bleeding. Left dorsal wound proximal to first toe also debrided. Left ischial area open new. Patient has been  using Prisma with wrapping every 3 days. Dermatology appointment is apparently tomorrow.Patient has completed his terbinafine 2-week course with some apparent improvement according to him, there is still flaking and dry skin in his foot on the left 3/23; area on the right foot is reopened. The area on the left anterior foot is about the same still [name] very necrotic adherent surface. He still has the area on the left leg and reopening is on the left buttock. He apparently saw dermatology although I do not have [name] note. According to the patient who is usually fairly well informed they did not have any good ideas. Put him on oral terbinafine which she is been on before. 3/30; using silver collagen to all wounds. Apparently his dermatologist put him on doxycycline and rifampin presumably some culture grew staph. I do not have this result. He remains on terbinafine although I have used terbinafine on him before 4/6; patient has had [name] fairly substantial reopening on the right foot between the first and second toes. He is finished his terbinafine and I believe is on doxycycline and rifampin still as prescribed by dermatology. We have been using silver collagen to all his wounds although the patient reports that he thinks silver alginate does better on the wounds on his buttock. 4/13; the area on his left lateral calf about the same size but it did not require debridement. Left dorsal foot just proximal to the webspace between the first and second toes is about the same. Still nonviable surface. I note some superficial bronze discoloration of the dorsal part of his foot Right dorsal foot just proximal to the first and second toes also looks about the same. I still think there may be the same discoloration I noted above on the left Left buttock wound looks about the same 4/20; left lateral calf appears to be gradually contracting using silver collagen. He remains on erythromycin empiric treatment for possible  erythrasma involving his digital spaces. The left dorsal foot wound is debrided of tightly adherent necrotic debris and really cleans up quite nicely. The right area is worse with expansion. I did not debride this it is now over the base of the second toe The area on his left buttock is smaller no debridement is required using silver collagen 5/4; left calf continues to make good progress. He arrives with erythema around the wounds on his dorsal foot which even extends to the plantar aspect. Very concerning for coexistent infection. He is finished the erythromycin I gave him for possible erythrasma this does not seem to have helped. The area on the left foot is about the same base of the dorsal toes Is area on the buttock looks improved on the left 5/11; left calf and left buttock continued to make good progress. Left foot is about the same to slightly improved. Major problem is on the right foot. He has not had an x-ray. Deep tissue culture I did last week showed both Enterobacter and E. coli. I did not change the doxycycline I put him on empirically although neither 1 of these were plated to doxycycline. He arrives today with the erythema looking worse on both the dorsal and  plantar foot. Macerated skin on the bottom of the foot. he has not been systemically unwell 5/18-Patient returns at 1 week, left calf wound appears to be making some progress, left buttock wound appears slightly worse than last time, left foot wound looks slightly better, right foot redness is marginally better. X-ray of both feet show no air or evidence of osteomyelitis. Patient is finished his Omnicef and terbinafine. He continues to have macerated skin on the bottom of the left foot as well as right 5/26; left calf wound is better, left buttock wound appears to have multiple small superficial open areas with surrounding macerated skin. X-rays that I did last time showed no evidence of osteomyelitis in either foot. He is  finished cefdinir and doxycycline. I do not think that he was on terbinafine. He continues to have [name] large superficial open area on the right foot anterior dorsal and slightly between the first and second toes. I did send him to dermatology 2 months ago or so wondering about whether they would do [name] fungal scraping. I do not believe they did but did do [name] culture. We have been using silver alginate to the toe areas, he has been using antifungals at home topically either ketoconazole or Lotrisone. We are using silver collagen on the left foot, silver alginate on the right, silver collagen on the left lateral leg and silver alginate on the left buttock 6/1; left buttock area is healed. We have the left dorsal foot, left lateral leg and right dorsal foot. We are using silver alginate to the areas on both feet and silver collagen to the area on his left lateral calf 6/8; the left buttock apparently reopened late last week. He is not really sure how this happened. He is tolerating the terbinafine. Using silver alginate to all wounds 6/15; left buttock wound is larger than last week but still superficial. Came in the clinic today with [name] report of purulence from the left lateral leg I did not identify any infection Both areas on his dorsal feet appear to be better. He is tolerating the terbinafine. Using silver alginate to all wounds 6/22; left buttock is about the same this week, left calf quite [name] bit better. His left foot is about the same however he comes in with erythema and warmth in the right forefoot once again. Culture that I gave him in the beginning of May showed Enterobacter and E. coli. I gave him doxycycline and things seem to improve although neither 1 of these organisms was specifically plated. 6/29; left buttock is larger and dry this week. Left lateral calf looks to me to be improved. Left dorsal foot also somewhat improved right foot completely unchanged. The erythema on the right foot is  still present. He is completing the Ceftin dinner that I gave him empirically [see discussion above.) 7/6 - All wounds look to be stable and perhaps improved, the left buttock wound is slightly smaller, per patient bleeds easily, completed ceftin, the right foot redness is less, he is on terbinafine 7/13; left buttock wound about the same perhaps slightly narrower. Area on the left lateral leg continues to narrow. Left dorsal foot slightly smaller right foot about the same. We are using silver alginate on the right foot and Hydrofera Blue to the areas on the left. Unna boot on the left 2 layer compression on the right 7/20; left buttock wound absolutely the same. Area on lateral leg continues to get better. Left dorsal foot require debridement as did the  right no major change in the 7/27; left buttock wound the same size necrotic debris over the surface. The area on the lateral leg is closed once again. His left foot looks better right foot about the same although there is some involvement now of the posterior first second toe area. He is still on terbinafine which I have given him for [name] month, not certain [name] centimeter major change 06/25/19-All wounds appear to be slightly improved according to report, left buttock wound looks clean, both foot wounds have minimal to no debris the right dorsal foot has minimal slough. We are using Hydrofera Blue to the left and silver alginate to the right foot and ischial wound. 8/10-Wounds all appear to be around the same, the right forefoot distal part has some redness which was not there before, however the wound looks clean and small. Ischial wound looks about the same with no changes 8/17; his wound on the left lateral calf which was his original chronic venous insufficiency wound remains closed. Since I last saw him the areas on the left dorsal foot right dorsal foot generally appear better but require debridement. The area on his left initial tuberosity appears  somewhat larger to me perhaps hyper granulated and bleeds very easily. We have been using Hydrofera Blue to the left dorsal foot and silver alginate to everything else 8/24; left lateral calf remains closed. The areas on his dorsal feet on the webspace of the first and second toes bilaterally both look better. The area on the left buttock which is the pressure ulcer stage II slightly smaller. I change the dressing to Hydrofera Blue to all areas 8/31; left lateral calf remains closed. The area on his dorsal feet bilaterally look better. Using Hydrofera Blue. Still requiring debridement on the left foot. No change in the left buttock pressure ulcers however 9/14; left lateral calf remains closed. Dorsal feet look quite [name] bit better than 2 [name] ago. Flaking dry skin also [name] lot better with the ammonium lactate I gave him 2 [name] ago. The area on the left buttock is improved. He states that his Roho cushion developed [name] leak and he is getting [name] new one, in the interim he is offloading this vigorously 9/21; left calf remains closed. Left heel which was [name] possible DTI looks better this week. He had macerated tissue around the left dorsal foot right foot looks satisfactory and improved left buttock wound. I changed his dressings to his feet to silver alginate bilaterally. Continuing Hydrofera Blue on the left buttock. 9/28 left calf remains closed. Left heel did not develop anything [possible DTI] dry flaking skin on the left dorsal foot. Right foot looks satisfactory. Improved left buttock wound. We are using silver alginate on his feet Hydrofera Blue on the buttock. I have asked him to go back to the Lotrisone on his feet including the wounds and surrounding areas 10/5; left calf remains closed. The areas on the left and right feet about the same. [name] lot of this is epithelialized however debris over the remaining open areas. He is using Lotrisone and silver alginate. The area on the left buttock using  Hydrofera Blue 10/26. Patient has been out for 3 [name] secondary to Covid concerns. He tested negative but I think his wife tested positive. He comes in today with the left foot substantially worse, right foot about the same. Even more concerning he states that the area on his left buttock closed over but then reopened and is considerably deeper in one  aspect than it was before [stage III wound] 11/2; left foot really about the same as last week. Quarter sized wound on the dorsal foot just proximal to the first second toes. Surrounding erythema with areas of denuded epithelium. This is not really much different looking. Did not look like cellulitis this time however. Right foot area about the same.. We have been using silver alginate alginate on his toes Left buttock still substantial irritated skin around the wound which I think looks somewhat better. We have been using Hydrofera Blue here. 11/9; left foot larger than last week and [name] very necrotic surface. Right foot I think is about the same perhaps slightly smaller. Debris around the circumference also addressed. Unfortunately on the left buttock there is been [name] decline. Satellite lesions below the major wound distally and now [name] an additional one posteriorly we have been using Hydrofera Blue but I think this is [name] pressure issue 11/16; left foot ulcer dorsally again [name] very adherent necrotic surface. Right foot is about the same. Not much change in the pressure ulcer on his left buttock. 11/30; left foot ulcer dorsally basically the same as when I saw him 2 [name] ago. Very adherent fibrinous debris on the wound surface. Patient reports [name] lot of drainage as well. 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 We have been using Iodoflex, patient changed back to alginate because of the drainage. Area on his right dorsal foot really looks benign with [name] healthier surface certainly [name] lot better than on the left.  Left buttock wounds all improved using Hydrofera Blue 12/7; left dorsal foot again no improvement. Tightly adherent debris. PCR culture I did last week only showed likely skin contaminant. I have gone ahead and done [name] punch biopsy of this which is about the last thing in terms of investigations I can think to do. He has known venous insufficiency and venous hypertension and this could be the issue here. The area on the right foot is about the same left buttock slightly worse according to our intake nurse secondary to [facility] Blue sticking to the wound 12/14; biopsy of the left foot that I did last time showed changes that could be related to wound healing/chronic stasis dermatitis phenomenon no neoplasm. We have been using silver alginate to both feet. I change the one on the left today to Sorbact and silver alginate to his other 2 wounds 12/28; the patient arrives with the following problems; Major issue is the dorsal left foot which continues to be [name] larger deeper wound area. Still with [name] completely nonviable surface Paradoxically the area mirror image on the right on the right dorsal foot appears to be getting better. He had some loss of dry denuded skin from the lower part of his original wound on the left lateral calf. Some of this area looked [name] little vulnerable and for this reason we put him in wrap that on this side this week The area on his left buttock is larger. He still has the erythematous circular area which I think is [name] combination of pressure, sweat. This does not look like cellulitis or fungal dermatitis 11/26/2019; -Dorsal left foot large open wound with depth. Still debris over the surface. Using Sorbact The area on the dorsal right foot paradoxically has closed over He has [name] reopening on the left ankle laterally at the base of his original wound that extended up into the calf. This appears clean. The left buttock wound is  smaller but with very adherent necrotic debris over the  surface. We have been using silver alginate here as well The patient had arterial studies done in 2017. He had biphasic waveforms at the dorsalis pedis and posterior tibial bilaterally. ABI in the left was 1.17. Digit waveforms were dampened. He has slight spasticity in the great toes I do not think [name] TBI would be possible 1/11; the patient comes in today with [name] sizable reopening between the first and second toes on the right. This is not exactly in the same location where we have been treating wounds previously. According to our intake nurse this was actually fairly deep but 0.6 cm. The area on the left dorsal foot looks about the same the surface is somewhat cleaner using Sorbact, his MRI is in 2 days. We have not managed yet to get arterial studies. The new reopening on the left lateral calf looks somewhat better using alginate. The left buttock wound is about the same using alginate 1/18; the patient had his ARTERIAL studies which were quite normal. ABI in the right at 1.13 with triphasic/biphasic waveforms on the left ABI 1.06 again with triphasic/biphasic waveforms. It would not have been possible to have done [name] toe brachial index because of spasticity. We have been using Sorbac to the left foot alginate to the rest of his wounds on the right foot left lateral calf and left buttock 1/25; arrives in clinic with erythema and swelling of the left forefoot worse over the first MTP area. This extends laterally dorsally and but also posteriorly. Still has an area on the left lateral part of the lower part of his calf wound it is eschared and clearly not closed. Area on the left buttock still with surrounding irritation and erythema. Right foot surface wound dorsally. The area between the right and first and second toes appears better. 2/1; The left foot wound is about the same. Erythema slightly better I gave him [name] week of doxycycline empirically Right foot wound is more extensive extending between  the toes to the plantar surface Left lateral calf really no open surface on the inferior part of his original wound however the entire area still looks vulnerable Absolutely no improvement in the left buttock wound required debridement. 2/8; the left foot is about the same. Erythema is slightly improved I gave him clindamycin last week. Right foot looks better he is using Lotrimin and silver alginate He has [name] breakdown in the left lateral calf. Denuded epithelium which I have removed Left buttock about the same were using Hydrofera Blue 2/15; left foot is about the same there is less surrounding erythema. Surface still has tightly adherent debris which I have debriding however not making any progress Right foot has [name] substantial wound on the medial right second toe between the first and second webspace. Still an open area on the left lateral calf distal area. Buttock wound is about the same 2/22; left foot is about the same less surrounding erythema. Surface has adherent debris. Polymen Ag Right foot area significant wound between the first and second toes. We have been using silver alginate here Left lateral leg polymen Ag at the base of his original venous insufficiency wound Left buttock some improvement here 3/1; Right foot is deteriorating in the first second toe webspace. Larger and more substantial. We have been using silver alginate. Left dorsal foot about the same markedly adherent surface debris using PolyMem Ag Left lateral calf surface debris using PolyMem AG Left buttock is improved  again using PolyMem Ag. He is completing his terbinafine. The erythema in the foot seems better. He has been on this for 2 [name] 3/8; no improvement in any wound area in fact he has [name] small open area on the dorsal midfoot which is new this week. He has not gotten his foot x-rays yet 3/15; his x-rays were both negative for osteomyelitis of both feet. No major change in any of his wounds on the  extremities however his buttock wounds are better. We have been using polymen on the buttocks, left lower leg. Iodoflex on the left foot and silver alginate on the right 3/22; arrives in clinic today with the 2 major issues are the improvement in the left dorsal foot wound which for once actually looks healthy with [name] nice healthy wound surface without debridement. Using Iodoflex here. Unfortunately on the left lateral calf which is in the distal part of his original wound he came to the clinic here for there was purulent drainage noted some increased breakdown scattered around the original area and [name] small area proximally. We we are using polymen here will change to silver alginate today. His buttock wound on the left is better and I think the area on the right first second toe webspace is also improved 3/29; left dorsal foot looks better. Using Iodoflex. Left ankle culture from deterioration last time grew E. coli, Enterobacter and Enterococcus. I will give him [name] course of cefdinir although that will not cover Enterococcus. The area on the right foot in the webspace of the first and second toe lateral first toe looks better. The area on his buttock is about healed Vascular appointment is on April 21. This is to look at his venous system vis--vis continued breakdown of the wounds on the left including the left lateral leg and left dorsal foot he. He has had previous ablations on this side 4/5; the area between the right first and second toes lateral aspect of the first toe looks better. Dorsal aspect of the left first toe on the left foot also improved. Unfortunately the left lateral lower leg is larger and there is [name] second satellite wound superiorly. The usual superficial abrasions on the left buttock overall better but certainly not closed 4/12; the area between the right first and second toes is improved. Dorsal aspect of the left foot also slightly smaller with [name] vibrant healthy looking  surface. No real change in the left lateral leg and the left buttock wound is healed He has an unaffordable co-pay for Apligraf. Appointment with vein and vascular with regards to the left leg venous part of the circulation is on 4/21 4/19; we continue to see improvement in all wound areas. Although this is minor. He has his vascular appointment on 4/21. The area on the left buttock has not reopened although right in the center of this area the skin looks somewhat threatened 4/26; the left buttock is unfortunately reopened. In general his left dorsal foot has [name] healthy surface and looks somewhat smaller although it was not measured as such. The area between his first and second toe webspace on the right as [name] small wound against the first toe. The patient saw vascular surgery. The real question I was asking was about the small saphenous vein on the left. He has previously ablated left greater saphenous vein. Nothing further was commented on on the left. Right greater saphenous vein without reflux at the saphenofemoral junction or proximal thigh there was no indication for ablation of  the right greater saphenous vein duplex was negative for DVT bilaterally. They did not think there was anything from [name] point of view that could be offered. They ABIs within normal limits 5/3; only small open area on the left buttock. The area on the left lateral leg which was his original venous reflux is now 2 wounds both which look clean. We are using Iodoflex on the left dorsal foot which looks healthy and smaller. He is down to [name] very tiny area between the right first and second toes, using silver alginate 5/10; all of his wounds appear better. We have much better edema control in 4 layer compression on the left. This may be the factor that is allowing the left foot and left lateral calf to heal. He has external compression garments at home 04/14/20-All of his wounds are progressing well, the left  forefoot is practically closed, left ischium appears to be about the same, right toe webspace is also smaller. The left lateral leg is about the same, continue using Hydrofera Blue to this, silver alginate to the ischium, Iodoflex to the toe space on the right 6/7; most of his wounds outside of the left buttock are doing well. The area on the left lateral calf and left dorsal foot are smaller. The area on the right foot in between the first and second toe webspace is barely visible although he still says there is some drainage here is the only reason I did not heal this out. Unfortunately the area on the left buttock almost looks like he has [name] skin tear from tape. He has open wound and then [name] large flap of skin that we are trying to get adherence over an area just next to the remaining wound 6/21; 2 week follow-up. I believe is been here for nurse visits. Miraculously the area between his first and second toes on the left dorsal foot is closed over. Still open on the right first second web space. The left lateral calf has 2 open areas. Distally this is more superficial. The proximal area had [name] little more depth and required debridement of adherent necrotic material. His buttock wound is actually larger we have been using silver alginate here 6/28; the patient's area on the left foot remains closed. Still open wet area between the first and second toes on the right and also extending into the plantar aspect. We have been using silver alginate in this location. He has 2 areas on the left lower leg part of his original long wounds which I think are better. We have been using Hydrofera Blue here. Hydrofera Blue to the left buttock which is stable 7/12; left foot remains closed. Left ankle is closed. May be [name] small area between his right first and second toes the only truly open area is on the left buttock. We have been using Hydrofera Blue here 7/19; patient arrives with marked deterioration especially in  the left foot and ankle. We did not put him in [name] compression wrap on the left last week in fact he wore his juxta lite stockings on either side although he does not have an underlying stocking. He has [name] reopening on the left dorsal foot, left lateral ankle and [name] new area on the right dorsal ankle. More worrisome is the degree of erythema on the left foot extending on the lateral foot into the lateral lower leg on the left 7/26; the patient had erythema and drainage from the lateral left ankle last week. Culture  of this grew MRSA resistant to doxycycline and clindamycin which are the 2 antibiotics we usually use with this patient who has multiple antibiotic allergies including linezolid, trimethoprim sulfamethoxazole. I had give him an empiric doxycycline and he comes in the area certainly looks somewhat better although it is blotchy in his lower leg. He has not been systemically unwell. He has had areas on the left dorsal foot which is [name] reopening, chronic wounds on the left lateral ankle. Both of these I think are secondary to chronic venous insufficiency. The area between his first and second toes is closed as far as I can tell. He had [name] new wrap injury on the right dorsal ankle last week. Finally he has an area on the left buttock. We have been using silver alginate to everything except the left buttock we are using Hydrofera Blue 06/30/20-Patient returns at 1 week, has been given [name] sample dose pack of NUZYRA which is [name] tetracycline derivative [omadacycline], patient has completed those, we have been using silver alginate to almost all the wounds except the left ischium where we are using Hydrofera Blue all of them look better 8/16; since I last saw the patient he has been doing well. The area on the left buttock, left lateral ankle and left foot are all closed today. He has completed the [place] I gave him last time and tolerated this well. He still has open areas on the right dorsal ankle and in the  right first second toe area which we are using silver alginate. 8/23; we put him in his bilateral external compression stockings last week as he did not have anything open on either leg except for concerning area between the right first and second toe. He comes in today with an area on the left dorsal foot slightly more proximal than the original wound, the left lateral foot but this is actually [name] continuation of the area he had on the left lateral ankle from last time. As well he is opened up on the left buttock again. 8/30; comes in today with things looking [name] lot better. The area on the left lower ankle has closed down as has the left foot but with eschar in both areas. The area on the dorsal right ankle is also epithelialized. Very little remaining of the left buttock wound. We have been using silver alginate on all wound areas 9/13; the area in the first second toe webspace on the right has fully epithelialized. He still has some vulnerable epithelium on the right and the ankle and the dorsal foot. He notes weeping. He is using his juxta lite stocking. On the left again the left dorsal foot is closed left lateral ankle is closed. We went to the juxta lite stocking here as well. Still vulnerable in the left buttock although only 2 small open areas remain here 9/27; 2-week follow-up. We did not look at his left leg but the patient says everything is closed. He is [name] bit disturbed by the amount of edema in his left foot he is using juxta lite stockings but asking about over the toes stockings which would be 30/40, will talk to him next time. According to him there is no open wound on either the left foot or the left ankle/calf He has an open area on the dorsal right calf which I initially point [name] wrap injury. He has superficial remaining wound on the left ischial tuberosity been using silver alginate although he says this sticks to the wound 10/5;  we gave him 2-week follow-up but he called yesterday  expressing some concerns about his right foot right ankle and the left buttock. He came in early. There is still no open areas on the left leg and that still in his juxta lite stocking 10/11; he only has 1 small area on the left buttock that remains measuring millimeters 1 mm. Still has the same irritated skin in this area. We recommended zinc oxide when this eventually closes and pressure relief is meticulously is he can do this. He still has an area on the dorsal part of his right first through third toes which is [name] bit irritated and still open and on the dorsal ankle near the crease of the ankle. We have been using silver alginate and using his own stocking. He has nothing open on the left leg or foot 10/25; 2-week follow-up. Not nearly as good on the left buttock as I was hoping. For open areas with 5 looking threatened small. He has the erythematous irritated chronic skin in this area. 1 area on the right dorsal ankle. He reports this area bleeds easily Right dorsal foot just proximal to the base of his toes We have been using silver alginate. 11/8; 2-week follow-up. Left buttock is about the same although I do not think the wounds are in the same location we have been using silver alginate. I have asked him to use zinc oxide on the skin around the wounds. He still has [name] small area on the right dorsal ankle he reports this bleeds easily Right dorsal foot just proximal to the base of the toes does not have anything open although the skin is very dry and scaly He has [name] new opening on the nailbed of the left great toe. Nothing on the left ankle 11/29; 3-week follow-up. Left buttock has 2 open areas. And washing of these wounds today started bleeding easily. Suggesting very friable tissue. We have been using silver alginate. Right dorsal ankle which I thought was initially [name] wrap injury we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  Apligraf dressing was inadvertently removed therefore we elected to prep the wound for [name] second Apligraf. I put him on doxycycline on 6/1 the erythema in the foot 05/03/16 we did not remove the dressing from the superior wound as this is where I put all of his last Apligraf. Surface debridement done with [name] curette of the lower wound which looks very healthy. The area on the left foot also looks quite satisfactory  at the dorsal artery at the first and second toes 05/10/16; continue Apligraf to this. Her wound, Hydrafera to the lower wound. He has [name] new area on the right second toe. Left dorsal foot firstoosecond toe also looks improved 05/24/16; wound dimensions must be smaller I was able to use Apligraf to all 3 remaining wound areas. 06/07/16 patient's last Apligraf was 2 [name] ago. He arrives today with the 2 wounds on his lateral left leg joined together. This would have to be seen as [name] negative. He also has [name] small wound in his first and second toe on the left dorsally with quite [name] bit of surrounding erythema in the first second and third toes. This looks to be infected or inflamed, very difficult clinical call. 06/21/16: lateral left leg combined wounds. Adherent surface slough area on the left dorsal foot at roughly the fourth toe looks improved 07/12/16; he now has [name] single linear wound on the lateral left leg. This does not look to be [name] lot changed from when I lost saw this. The area on his dorsal left foot looks considerably better however. 08/02/16; no major change in the substantial area on his left lateral leg since last time. We have been using Hydrofera Blue for [name] prolonged period of time now. The area on his left foot is also unchanged from last review 07/19/16; the area on his dorsal foot on the left looks considerably smaller. He is beginning to have significant rims of epithelialization on the lateral left leg wound. This also looks better. 08/05/16; the patient came in for [name] nurse visit  today. Apparently the area on his left lateral leg looks better and it was wrapped. However in general discussion the patient noted [name] new area on the dorsal aspect of his right second toe. The exact etiology of this is unclear but likely relates to pressure. 08/09/16 really the area on the left lateral leg did not really look that healthy today perhaps slightly larger and measurements. The area on his dorsal right second toe is improved also the left foot wound looks stable to improved 08/16/16; the area on the last lateral leg did not change any of dimensions. Post debridement with [name] curet the area looked better. Left foot wound improved and the area on the dorsal right second toe is improved 08/23/16; the area on the left lateral leg may be slightly smaller both in terms of length and width. Aggressive debridement with [name] curette afterwards the tissue appears healthier. Left foot wound appears improved in the area on the dorsal right second toe is improved 08/30/16 patient developed [name] fever over the weekend and was seen in an urgent care. Felt to have [name] UTI and put on doxycycline. He has been since changed over the phone to [facility]. After we took off the wrap on his right leg today the leg is swollen warm and erythematous, probably more likely the source of the fever 09/06/16; have been using collagen to the major left leg wound, silver alginate to the area on his anterior foot/toes 09/13/16; the areas on his anterior foot/toes on both sides appear to be virtually closed. Extensive wound on the left lateral leg perhaps slightly narrower but each visit still covered an adherent surface slough 09/16/16 patient was in for his usual Thursday nurse visit however the intake nurse noted significant erythema of his dorsal right foot. He is also running [name] low- grade fever and having increasing spasms in the right leg 09/20/16 here for cellulitis involving  his right great toes and forefoot. This is [name] lot better.  Still requiring debridement on his left lateral leg. Santyl direct says he needs prior authorization. Therefore his wife cannot change this at home 09/30/16; the patient's extensive area on the left lateral calf and ankle perhaps somewhat better. Using Santyl. The area on the left toes is healed and I think the area on his right dorsal foot is healed as well. There is no cellulitis or venous inflammation involving the right leg. He is going to need compression stockings here. 10/07/16; the patient's extensive wound on the left lateral calf and ankle does not measure any differently however there appears to be less adherent surface slough using Santyl and aggressive weekly debridements 10/21/16; no major change in the area on the left lateral calf. Still the same measurement still very difficult to debridement adherent slough and nonviable subcutaneous tissue. This is not really been helped by several [name] of Santyl. Previously for 2 [name] I used Iodoflex for [name] short period. [name] prolonged course of Hydrofera Blue didn't really help. I'[name] not sure why I only used 2 [name] of Iodoflex on this there is no evidence of surrounding infection. He has [name] small area on the right second toe which looks as though it's progressing towards closure 10/28/16; the wounds on his toes appear to be closed. No major change in the left lateral leg wound although the surface looks somewhat better using Iodoflex. He has had previous arterial studies that were normal. He has had reflux studies and is status post ablation although I don't have any exact notes on which vein was ablated. I'll need to check the surgical record 11/04/16; he's had [name] reopening between the first and second toe on the left and right. No major change in the left lateral leg wound. There is what appears to be cellulitis of the left dorsal foot 11/18/16 the patient was hospitalized initially in [place] and then subsequently transferred to [facility] long and was  admitted there from 11/09/16 through 11/12/16. He had developed progressive cellulitis on the right leg in spite of the doxycycline I gave him. I'd spoken to the hospitalist in [place] who was concerned about continuing leukocytosis. CT scan is what I suggested this was done which showed soft tissue swelling without evidence of osteomyelitis or an underlying abscess blood cultures were negative. At [facility] he was treated with vancomycin and Primaxin and then add an infectious disease consult. He was transitioned to Ceftaroline. He has been making progressive improvement. Overall [name] severe cellulitis of the right leg. He is been using silver alginate to her original wound on the left leg. The wounds in his toes on the right are closed there is [name] small open area on the base of the left second toe 11/26/15; the patient's right leg is much better although there is still some edema here this could be reminiscent from his severe cellulitis likely on top of some degree of lymphedema. His left anterior leg wound has less surface slough as reported by her intake nurse. Small wound at the base of the left second toe 12/02/16; patient's right leg is better and there is no open wound here. His left anterior lateral leg wound continues to have [name] healthy-looking surface. Small wound at the base of the left second toe however there is erythema in the left forefoot which is worrisome 12/16/16; is no open wounds on his right leg. We took measurements for stockings. His left anterior lateral leg wound continues  to have [name] healthy-looking surface. I'[name] not sure where we were with the Apligraf run through his insurance. We have been using Iodoflex. He has [name] thick eschar on the left first second toe interface, I suspect this may be fungal however there is no visible open 12/23/16; no open wound on his right leg. He has 2 small areas left of the linear wound that was remaining last week. We have been using Prisma, I thought  I have disclosed this week, we can only look forward to next week 01/03/17; the patient had concerning areas of erythema last week, already on doxycycline for UTI through his primary doctor. The erythema is absolutely no better there is warmth and swelling both medially from the left lateral leg wound and also the dorsal left foot. 01/06/17- Patient is here for follow-up evaluation of his left lateral leg ulcer and bilateral feet ulcers. He is on oral antibiotic therapy, tolerating that. Nursing staff and the patient states that the erythema is improved from Monday. 01/13/17; the predominant left lateral leg wound continues to be problematic. I had put Apligraf on him earlier this month once. However he subsequently developed what appeared to be an intense cellulitis around the left lateral leg wound. I gave him Dalvance I think on 2/12 perhaps 2/13 he continues on cefdinir. The erythema is still present but the warmth and swelling is improved. I am hopeful that the cellulitis part of this control. I wouldn't be surprised if there is an element of venous inflammation as well. 01/17/17. The erythema is present but better in the left leg. His left lateral leg wound still does not have [name] viable surface buttons certain parts of this long thin wound it appears like there has been improvement in dimensions. 01/20/17; the erythema still present but much better in the left leg. I'[name] thinking this is his usual degree of chronic venous inflammation. The wound on the left leg looks somewhat better. Is less surface slough 01/27/17; erythema is back to the chronic venous inflammation. The wound on the left leg is somewhat better. I am back to the point where I like to try an Apligraf once again 02/10/17; slight improvement in wound dimensions. Apligraf #2. He is completing his doxycycline 02/14/17; patient arrives today having completed doxycycline last Thursday. This was supposed to be [name] nurse visit however once again he  hasn't tense erythema from the medial part of his wound extending over the lower leg. Also erythema in his foot this is roughly in the same distribution as last time. He has baseline chronic venous inflammation however this is [name] lot worse than the baseline I have learned to accept the on him is baseline inflammation 02/24/17- patient is here for follow-up evaluation. He is tolerating compression therapy. His voicing no complaints or concerns he is here anticipating an Apligraf 03/03/17; he arrives today with an adherent necrotic surface. I don't think this is surface is going to be amenable for Apligraf's. The erythema around his wound and on the left dorsal foot has resolved he is off antibiotics 03/10/17; better-looking surface today. I don't think he can tolerate Apligraf's. He tells me he had [name] wound VAC after [name] skin graft years ago to this area and they had difficulty with [name] seal. The erythema continues to be stable around this some degree of chronic venous inflammation but he also has recurrent cellulitis. We have been using Iodoflex 03/17/17; continued improvement in the surface and may be small changes in dimensions. Using Iodoflex which  seems the only thing that will control his surface 03/24/17- He is here for follow up evaluation of his LLE lateral ulceration and ulcer to right dorsal foot/toe space. He is voicing no complaints or concerns, He is tolerating compression wrap. 03/31/17 arrives today with [name] much healthier looking wound on the left lower extremity. We have been using Iodoflex for [name] prolonged period of time which has for the first time prepared and adequate looking wound bed although we have not had much in the way of wound dimension improvement. He also has [name] small wound between the first and second toe on the right 04/07/17; arrives today with [name] healthy-looking wound bed and at least the top 50% of this wound appears to be now her. No debridement was required I have changed him to  [facility] last week after prolonged Iodoflex. He did not do well with Apligraf's. We've had [name] re-opening between the first and second toe on the right 04/14/17; arrives today with [name] healthier looking wound bed contractions and the top 50% of this wound and some on the lesser 50%. Wound bed appears healthy. The area between the first and second toe on the right still remains problematic 04/21/17; continued very gradual improvement. Using [facility] 04/28/17; continued very gradual improvement in the left lateral leg venous insufficiency wound. His periwound erythema is very mild. We have been using Hydrofera Blue. Wound is making progress especially in the superior 50% 05/05/17; he continues to have very gradual improvement in the left lateral venous insufficiency wound. Both in terms with an length rings are improving. I debrided this every 2 [name] with #5 curet and we have been using Hydrofera Blue and again making good progress With regards to the wounds between his right first and second toe which I thought might of been tinea pedis he is not making as much progress very dry scaly skin over the area. Also the area at the base of the left first and second toe in [name] similar condition 05/12/17; continued gradual improvement in the refractory left lateral venous insufficiency wound on the left. Dimension smaller. Surface still requiring debridement using Hydrofera Blue 05/19/17; continued gradual improvement in the refractory left lateral venous ulceration. Careful inspection of the wound bed underlying rumination suggested some degree of epithelialization over the surface no debridement indicated. Continue Hydrofera Blue difficult areas between his toes first and third on the left than first and second on the right. I'[name] going to change to silver alginate from silver collagen. Continue ketoconazole as I suspect underlying tinea pedis 05/26/17; left lateral leg venous insufficiency wound. We've been  using Hydrofera Blue. I believe that there is expanding epithelialization over the surface of the wound albeit not coming from the wound circumference. This is [name] bit of an odd situation in which the epithelialization seems to be coming from the surface of the wound rather than in the exact circumference. There is still small open areas mostly along the lateral margin of the wound. ooHe has unchanged areas between the left first and second and the right first second toes which I been treating for tenia pedis 06/02/17; left lateral leg venous insufficiency wound. We have been using Hydrofera Blue. Somewhat smaller from the wound circumference. The surface of the wound remains [name] bit on it almost epithelialized sedation in appearance. I use an open curette today debridement in the surface of all of this especially the edges ooSmall open wounds remaining on the dorsal right first and second toe interspace and  the plantar left first second toe and her face on the left 06/09/17; wound on the left lateral leg continues to be smaller but very gradual and very dry surface using Hydrofera Blue 06/16/17 requires weekly debridements now on the left lateral leg although this continues to contract. I changed to silver collagen last week because of dryness of the wound bed. Using Iodoflex to the areas on his first and second toes/web space bilaterally 06/24/17; patient with history of paraplegia also chronic venous insufficiency with lymphedema. Has [name] very difficult wound on the left lateral leg. This has been gradually reducing in terms of with but comes in with [name] very dry adherent surface. High switch to silver collagen [name] week or so ago with hydrogel to keep the area moist. This is been refractory to multiple dressing attempts. He also has areas in his first and second toes bilaterally in the anterior and posterior web space. I had been using Iodoflex here after [name] prolonged course of silver alginate with ketoconazole  was ineffective [question tinea pedis] 07/14/17; patient arrives today with [name] very difficult adherent material over his left lateral lower leg wound. He also has surrounding erythema and poorly controlled edema. He was switched his Santyl last visit which the nurses are applying once during his doctor visit and once on [name] nurse visit. He was also reduced to 2 layer compression I'[name] not exactly sure of the issue here. 07/21/17; better surface today after 1 week of Iodoflex. Significant cellulitis that we treated last week also better. [Doxycycline] 07/28/17 better surface today with now 2 [name] of Iodoflex. Significant cellulitis treated with doxycycline. He has now completed the doxycycline and he is back to his usual degree of chronic venous inflammation/stasis dermatitis. He reminds me he has had ablations surgery here 08/04/17; continued improvement with Iodoflex to the left lateral leg wound in terms of the surface of the wound although the dimensions are better. 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 Problematic areas on the plantar aspect of the first second toe web space on the left and the dorsal aspect of the first second toe web space on the right. At one point I felt these were probably related to chronic fungal infections in treated him aggressively for this although we have not made any improvement here. 08/11/17; left lateral leg. Surface continues to improve with the Iodoflex although we are not seeing much improvement in overall wound dimensions. Areas on his plantar left foot and right foot show no improvement. In fact the right foot looks somewhat worse 08/18/17; left lateral leg. We changed to [facility] Blue last week after [name] prolonged course of Iodoflex which helps get the surface better. It appears that the wound with is improved. Continue with difficult areas on the left dorsal first second and plantar first second  on the right 09/01/17; patient arrives in clinic today having had [name] temperature of 103 yesterday. He was seen in the ER and [facility]. The patient was concerned he could have cellulitis again in the right leg however they diagnosed him with [name] UTI and he is now on Keflex. He has [name] history of cellulitis which is been recurrent and difficult but this is been in the left leg, in the past 5 use doxycycline. He does in and out catheterizations at home which are risk factors for UTI 09/08/17; patient will be completing his Keflex this weekend. The erythema on the left leg is considerably better.  He has [name] new wound today on the medial part of the right leg small superficial almost looks like [name] skin tear. He has worsening of the area on the right dorsal first and second toe. His major area on the left lateral leg is better. Using Hydrofera Blue on all areas 09/15/17; gradual reduction in width on the long wound in the left lateral leg. No debridement required. He also has wounds on the plantar aspect of his left first second toe web space and on the dorsal aspect of the right first second toe web space. 09/22/17; there continues to be very gradual improvements in the dimensions of the left lateral leg wound. He hasn't round erythematous spot with might be pressure on his wheelchair. There is no evidence obviously of infection no purulence no warmth ooHe has [name] dry scaled area on the plantar aspect of the left first second toe ooImproved area on the dorsal right first second toe. 09/29/17; left lateral leg wound continues to improve in dimensions mostly with an is still [name] fairly long but increasingly narrow wound. ooHe has [name] dry scaled area on the plantar aspect of his left first second toe web space ooIncreasingly concerning area on the dorsal right first second toe. In fact I am concerned today about possible cellulitis around this wound. The areas extending up his second toe and although there is  deformities here almost appears to abut on the nailbed. 10/06/17; left lateral leg wound continues to make very gradual progress. Tissue culture I did from the right first second toe dorsal foot last time grew MRSA and enterococcus which was vancomycin sensitive. This was not sensitive to clindamycin or doxycycline. He is allergic to Zyvox and sulfa we have therefore arrange for him to have dalvance infusion tomorrow. He is had this in the past and tolerated it well 10/20/17; left lateral leg wound continues to make decent progress. This is certainly reduced in terms of with there is advancing epithelialization.ooThe cellulitis in the right foot looks better although he still has [name] deep wound in the dorsal aspect of the first second toe web space. Plantar left first toe web space on the left I think is making some progress 10/27/17; left lateral leg wound continues to make decent progress. Advancing epithelialization.using Hydrofera Blue ooThe right first second toe web space wound is better-looking using silver alginate ooImprovement in the left plantar first second toe web space. Again using silver alginate 11/03/17 left lateral leg wound continues to make decent progress albeit slowly. Using Hydrofera Blue ooThe right per second toe web space continues to be [name] very problematic looking punched out wound. I obtained [name] piece of tissue for deep culture I did extensively treated this for fungus. It is difficult to imagine that this is [name] pressure area as the patient states other than going outside he doesn't really wear shoes at home ooThe left plantar first second toe web space looked fairly senescent. Necrotic edges. This required debridement oochange to Hydrofera Blue to all wound areas 11/10/17; left lateral leg wound continues to contract. Using Hydrofera Blue ooOn the right dorsal first second toe web space dorsally. Culture I did of this area last week grew MRSA there is not an easy oral  option in this patient was multiple antibiotic allergies or intolerances. This was only [name] rare culture isolate I'[name] therefore going to use Bactroban under silver alginate ooOn the left plantar first second toe web space. Debridement is required here. This is also unchanged 11/17/17; left  lateral leg wound continues to contract using Hydrofera Blue this is no longer the major issue. ooThe major concern here is the right first second toe web space. He now has an open area going from dorsally to the plantar aspect. There is now wound on the inner lateral part of the first toe. Not [name] very viable surface on this. There is erythema spreading medially into the forefoot. ooNo major change in the left first second toe plantar wound 11/24/17; left lateral leg wound continues to contract using Hydrofera Blue. Nice improvement today ooThe right first second toe web space all of this looks [name] lot less angry than last week. I have given him clindamycin and topical Bactroban for MRSA and terbinafine for the possibility of underlining tinea pedis that I could not control with ketoconazole. Looks somewhat better ooThe area on the plantar left first second toe web space is weeping with dried debris around the wound 12/01/17; left lateral leg wound continues to contract he Hydrofera Blue. It is becoming thinner in terms of with nevertheless it is making good improvement. ooThe right first second toe web space looks less angry but still [name] large necrotic-looking wounds starting on the plantar aspect of the right foot extending between the toes and now extensively on the base of the right second toe. I gave him clindamycin and topical Bactroban for MRSA anterior benefiting for the possibility of underlying tinea pedis. Not looking better today ooThe area on the left first/second toe looks better. Debrided of necrotic debris 12/05/17* the patient was worked in urgently today because over the weekend he found blood on his  incontinence bad when he woke up. He was found to have an ulcer by his wife who does most of his wound care. He came in today for [place] to look at this. He has not had [name] history of wounds in his buttocks in spite of his paraplegia. 12/08/17; seen in follow-up today at his usual appointment. He was seen earlier this week and found to have [name] new wound on his buttock. We also follow him for wounds on the left lateral leg, left first second toe web space and right first second toe web space 12/15/17; we have been using Hydrofera Blue to the left lateral leg which has improved. The right first second toe web space has also improved. Left first second toe web space plantar aspect looks stable. The left buttock has worsened using Santyl. Apparently the buttock has drainage 12/22/17; we have been using Hydrofera Blue to the left lateral leg which continues to improve now 2 small wounds separated by normal skin. He tells [place] he had [name] fever up to 100 yesterday he is prone to UTIs but has not noted anything different. He does in and out catheterizations. The area between the first and second toes today does not look good necrotic surface covered with what looks to be purulent drainage and erythema extending into the third toe. I had gotten this to something that I thought look better last time however it is not look good today. He also has [name] necrotic surface over the buttock wound which is expanded. I thought there might be infection under here so I removed [name] lot of the surface with [name] #5 curet though nothing look like it really needed culturing. He is been using Santyl to this area 12/27/17; his original wound on the left lateral leg continues to improve using Hydrofera Blue. I gave him samples of Baxdella although he was unable to  take them out of fear for an allergic reaction ["lump in his throat"].the culture I did of the purulent drainage from his second toe last week showed both enterococcus and [name] set Enterobacter I  was also concerned about the erythema on the bottom of his foot although paradoxically although this looks somewhat better today. 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 We've been using silver alginate here. They came up today that he sleeps in [name] recliner, I'[name] not sure why but I asked him to stop this 01/03/18; his original wound we've been using Hydrofera Blue is now separated into 2 areas. ooUlcer on his left buttock is better he is off the recliner and sleeping in bed ooFinally both wound areas between his first and second toes also looks some better 01/10/18; his original wound on the left lateral leg is now separated into 2 wounds we've been using Hydrofera Blue ooUlcer on his left buttock has some drainage. There is [name] small probing site going into muscle layer superiorly.using silver alginate -He arrives today with [name] deep tissue injury on the left heel ooThe wound on the dorsal aspect of his first second toe on the left looks [name] lot betterusing silver alginate ketoconazole ooThe area on the first second toe web space on the right also looks [name] lot bette 01/17/18; his original wound on the left lateral leg continues to progress using Hydrofera Blue ooUlcer on his left buttock also is smaller surface healthier except for [name] small probing site going into the muscle layer superiorly. 2.4 cm of tunneling in this area ooDTI on his left heel we have only been offloading. Looks better than last week no threatened open no evidence of infection oothe wound on the dorsal aspect of the first second toe on the left continues to look like it's regressing we have only been using silver alginate and terbinafine orally ooThe area in the first second toe web space on the right also looks to be [name] lot better using silver alginate and terbinafine I think this was prompted by tinea pedis 01/31/18; the patient was  hospitalized in [place] last week apparently for [name] complicated UTI. He was discharged on cefepime he does in and out catheterizations. In the hospital he was discovered [name] I don't mild elevation of AST and ALT and the terbinafine was stopped.predictably the pressure ulcer on s his buttock looks betterusing silver alginate. The area on the left lateral leg also is better using Hydrofera Blue. The area between the first and second toes on the left better. First and second toes on the right still substantial but better. Finally the DTI on the left heel has held together and looks like it's resolving 02/07/18-he is here in follow-up evaluation for multiple ulcerations. He has new injury to the lateral aspect of the last issue [name] pressure ulcer, he states this is from adhesive removal trauma. He states he has tried multiple adhesive products with no success. All other ulcers appear stable. The left heel DTI is resolving. We will continue with same treatment plan and follow-up next week. 02/14/18; follow-up for multiple areas. ooHe has [name] new area last week on the lateral aspect of his pressure ulcer more over the posterior trochanter. The original pressure ulcer looks quite stable has healthy granulation. 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 Almost  fully epithelialized on the original superior area using Hydrofera Blue ooDTI on the left heel has peeled off this week to reveal [name] small superficial wound under denuded skin and subcutaneous tissue ooBoth areas between the first and second toes look better including nothing open on the left 02/21/18; ooThe patient's wounds on his left ischial tuberosity and posterior left greater trochanter actually looked better. He has [name] large area of irritation around the area which I think is contact dermatitis. I am doubtful that this is fungal ooHis original  wound on the left lateral calf continues to improve we have been using Hydrofera Blue ooThere is no open area in the left first second toe web space although there is [name] lot of thick callus ooThe DTI on the left heel required debridement today of necrotic surface eschar and subcutaneous tissue using silver alginate ooFinally the area on the right first second toe webspace continues to contract using silver alginate and ketoconazole 02/28/18 ooLeft ischial tuberosity wounds look better using silver alginate. ooOriginal wound on the left calf only has one small open area left using Hydrofera Blue ooDTI on the left heel required debridement mostly removing skin from around this wound surface. Using silver alginate ooThe areas on the right first/second toe web space using silver alginate and ketoconazole 03/08/18 on evaluation today patient appears to be doing decently well as best I can tell in regard to his wounds. This is the first time that I have seen him as he generally is followed by Dr. Dellia Nims. With that being said none of his wounds appear to be infected he does have an area where there is some skin covering what appears to be [name] new wound on the left dorsal surface of his great toe. This is right at the nail bed. With that being said I do believe that debrided away some of the excess skin can be of benefit in this regard. Otherwise he has been tolerating the dressing changes without complication. 03/14/18; patient arrives today with the multiplicity of wounds that we are following. He has not been systemically unwell ooOriginal wound on the left lateral calf now only has 2 small open areas we've been using Hydrofera Blue which should continue ooThe deep tissue injury on the left heel requires debridement today. We've been using silver alginate ooThe left first second toe and the right first second toe are both are reminiscence what I think was tinea pedis. Apparently some of the callus  Surface between the toes was removed last week when it started draining. ooPurulent drainage coming from the wound on the ischial tuberosity on the left. 03/21/18-He is here in follow-up evaluation for multiple wounds. 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 He tolerated debridement. The only change to last week's recommendations is to discontinue antifungal cream between toes. He will follow-up next week 03/28/18; following up for multiple wounds;Concern this week is streaking redness and swelling in the right foot. He is going to need antibiotics for this. 03/31/18; follow-up for right foot cellulitis. Streaking redness and swelling in the right foot on 03/28/18. He has multiple antibiotic intolerances and [name] history of MRSA. I put him on clindamycin 300 mg every 6 and brought him in for [name] quick check. He has an open wound between his first and second toes on the right foot as [name] potential source. 04/04/18; ooRight foot cellulitis is resolving he is completing clindamycin. This is truly good news ooLeft lateral calf  wound which is initial wound only has one small open area inferiorly this is close to healing out. He has compression stockings. We will use Hydrofera Blue right down to the epithelialization of this ooNonviable surface on the left heel which was initially pressure with [name] DTI. We've been using Hydrofera Blue. I'[name] going to switch this back to silver alginate ooLeft first second toe/tinea pedis this looks better using silver alginate ooRight first second toe tinea pedis using silver alginate ooLarge pressure ulcers on theLeft ischial tuberosity. Small wound here Looks better. I am uncertain about the surface over the large wound. Using silver alginate 04/11/18; ooCellulitis in the right foot is resolved ooLeft lateral calf wound which was his original wounds still has 2 tiny open areas remaining this is  just about closed ooNonviable surface on the left heel is better but still requires debridement ooLeft first second toe/tinea pedis still open using silver alginate ooRight first second toe wound tinea pedis I asked him to go back to using ketoconazole and silver alginate ooLarge pressure ulcers on the left ischial tuberosity this shear injury here is resolved. Wound is smaller. No evidence of infection using silver alginate 04/18/18; ooPatient arrives with an intense area of cellulitis in the right mid lower calf extending into the right heel area. Bright red and warm. Smaller area on the left anterior leg. He has [name] significant history of MRSA. He will definitely need antibioticsoodoxycycline ooHe now has 2 open areas on the left ischial tuberosity the original large wound and now [name] satellite area which I think was above his initial satellite areas. Not [name] wonderful surface on this satellite area surrounding erythema which looks like pressure related. ooHis left lateral calf wound again his original wound is just about closed ooLeft heel pressure injury still requiring debridement ooLeft first second toe looks [name] lot better using silver alginate ooRight first second toe also using silver alginate and ketoconazole cream also looks better 04/20/18; the patient was worked in early today out of concerns with his cellulitis on the right leg. I had started him on doxycycline. This was 2 days ago. His wife was concerned about the swelling in the area. Also concerned about the left buttock. He has not been systemically unwell no fever chills. No nausea vomiting or diarrhea 04/25/18; the patient's left buttock wound is continued to deteriorate he is using Hydrofera Blue. He is still completing clindamycin for the cellulitis on the right leg although all of this looks better. 05/02/18 ooLeft buttock wound still with [name] lot of drainage and [name] very tightly adherent fibrinous necrotic surface. He has [name]  deeper area superiorly ooThe left lateral calf wound is still closed ooDTI wound on the left heel necrotic surface especially the circumference using Iodoflex ooAreas between his left first second toe and right first second toe both look better. Dorsally and the right first second toe he had [name] necrotic surface although at smaller. In using silver alginate and ketoconazole. I did [name] culture last week which was [name] deep tissue culture of the reminiscence of the open wound on the right first second toe dorsally. This grew [name] few Acinetobacter and [name] few methicillin-resistant staph aureus. Nevertheless the area actually this week looked better. I didn't feel the need to specifically address this at least in terms of systemic antibiotics. 05/09/18; wounds are measuring larger more drainage per our intake. We are using Santyl covered with alginate on the large superficial buttock wounds, Iodosorb on the left heel, ketoconazole and silver  alginate to the dorsal first and second toes bilaterally. 05/16/18; ooThe area on his left buttock better in some aspects although the area superiorly over the ischial tuberosity required an extensive debridement.using Santyl ooLeft heel appears stable. Using Iodoflex ooThe areas between his first and second toes are not bad however there is spreading erythema up the dorsal aspect of his left foot this looks like cellulitis again. He is insensate the erythema is really very brilliant.o Erysipelas He went to see an allergist days ago because he was itching part of this he had lab work done. This showed [name] white count of 15.1 with 70% neutrophils. Hemoglobin of 11.4 and [name] platelet count of 659,000. Last white count we had in [facility] was [name] 2-1/2 years ago which was 25.9 but he was ill at the time. He was able to show me some lab work that was done by his primary physician the pattern is about the same. 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 But prompted me to go ahead and do x-rays of both feet and the pelvis rule out osteomyelitis. He also had [name] comprehensive metabolic panel this was reasonably normal his albumin was 3.7 liver function tests BUN/creatinine all normal 05/23/18; x-rays of both his feet from last week were negative for underlying pulmonary abnormality. The x-ray of his pelvis however showed mild irregularity in the left ischial which may represent some early osteomyelitis. The wound in the left ischial continues to get deeper clearly now exposed muscle. Each week necrotic surface material over this area. Whereas the rest of the wounds do not look so bad. ooThe left ischial wound we have been using Santyl and calcium alginate ooT the left heel surface necrotic debris using Iodoflex o ooThe left lateral leg is still healed ooAreas on the left dorsal foot and the right dorsal foot are about the same. There is some inflammation on the left which might represent contact dermatitis, fungal dermatitis I am doubtful cellulitis although this looks better than last week 05/30/18; CT scan done at Hospital did not show any osteomyelitis or abscess. Suggested the possibility of underlying cellulitis although I don't see [name] lot of evidence of this at the bedside ooThe wound itself on the left buttock/upper thigh actually looks somewhat better. No debridement ooLeft heel also looks better no debridement continue Iodoflex ooBoth dorsal first second toe spaces appear better using Lotrisone. Left still required debridement 06/06/18; ooIntake reported some purulent looking drainage from the left gluteal wound. Using Santyl and calcium alginate ooLeft heel looks better although still [name] nonviable surface requiring debridement ooThe left dorsal foot first/second webspace actually expanding and somewhat deeper. I may consider doing [name] shave biopsy of this area ooRight dorsal foot first/second webspace appears stable to  improved. Using Lotrisone and silver alginate to both these areas 06/13/18 ooLeft gluteal surface looks better. Now separated in the 2 wounds. No debridement required. Still drainage. We'll continue silver alginate ooLeft heel continues to look better with Iodoflex continue this for at least another week ooOf his dorsal foot wounds the area on the left still has some depth although it looks better than last week. We've been using Lotrisone and silver alginate 06/20/18 ooLeft gluteal continues to look better healthy tissue ooLeft heel continues to look better healthy granulation wound is smaller. He is using Iodoflex and his long as this continues continue the Iodoflex ooDorsal right foot looks better unfortunately dorsal left foot does not. There is  swelling and erythema of his forefoot. He had minor trauma to this several days ago but doesn't think this was enough to have caused any tissue injury. Foot looks like cellulitis, we have had this problem before 06/27/18 on evaluation today patient appears to be doing [name] little worse in regard to his foot ulcer. 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 Both of which would really be his only options for treating this infection. In the past he has been given and effusion of Orbactiv. This is done very well for him in the past again it's one time dosing IV antibiotic therapy. Subsequently I do believe this is something we're gonna need to see about doing at this point in time. Currently his other wounds seem to be doing somewhat better in my pinion I'[name] pretty happy in that regard. 07/03/18 on evaluation today patient's wounds actually appear to be doing fairly well. He has been tolerating the dressing changes without complication. All in all he seems to be showing signs of improvement. In regard to the antibiotics he  has been dealing with infectious disease since I saw him last week as far as getting this scheduled. In the end [facility]'s [facility] this coming Friday. In the meantime he has been continuing to perform the dressing changes in such as previous. There does not appear to be any evidence of infection worsengin at this time. 07/10/18; ooSince I last saw this man 2 [name] ago things have actually improved. IV antibiotics of resulted in less forefoot erythema although there is still some present. He is not systemically unwell ooLeft buttock wounds o2 now have no depth there is increased epithelialization Using silver alginate ooLeft heel still requires debridement using Iodoflex ooLeft dorsal foot still with [name] sizable wound about the size of [name] border but healthy granulation ooRight dorsal foot still with [name] slitlike area using silver alginate 07/18/18; the patient's cellulitis in the left foot is improved in fact I think it is on its way to resolving. ooLeft buttock wounds o2 both look better although the larger one has hypertension granulation we've been using silver alginate ooLeft heel has some thick circumferential redundant skin over the wound edge which will need to be removed today we've been using Iodoflex ooLeft dorsal foot is still [name] sizable wound required debridement using silver alginate ooThe right dorsal foot is just about closed only [name] small open area remains here 07/25/18; left foot cellulitis is resolved ooLeft buttock wounds o2 both look better. Hyper-granulation on the major area ooLeft heel as some debris over the surface but otherwise looks [name] healthier wound. Using silver collagen ooRight dorsal foot is just about closed 07/31/18; arrives with our intake nurse worried about purulent drainage from the buttock. We had hyper-granulation here last week ooHis buttock wounds o2 continue to look better ooLeft heel some  debris over the surface but measuring smaller. ooRight dorsal foot unfortunately has openings between the toes ooLeft foot superficial wound looks less aggravated. 08/07/18 ooButtock wounds continue to look better although some of her granulation and the larger medial wound. silver alginate ooLeft heel continues to look [name] lot better.silver collagen ooLeft foot superficial wound looks less stable. Requires debridement. He has [name] new wound superficial area on the foot on the lateral dorsal foot. ooRight foot looks  better using silver alginate without Lotrisone 08/14/2018; patient was in the ER last week diagnosed with [name] UTI. He is now on Cefpodoxime and Macrodantin. ooButtock wounds continued to be smaller. Using silver alginate ooLeft heel continues to look better using silver collagen ooLeft foot superficial wound looks as though it is improving ooRight dorsal foot area is just about healed. 08/21/2018; patient is completed his antibiotics for his UTI. ooHe has 2 open areas on the buttocks. There is still not closed although the surface looks satisfactory. Using silver alginate ooLeft heel continues to improve using silver collagen ooThe bilateral dorsal foot areas which are at the base of his first and second toes/possible tinea pedis are actually stable on the left but worse on the right. The area on the left required debridement of necrotic surface. After debridement I obtained [name] specimen for PCR culture. ooThe right dorsal foot which is been just about healed last week is now reopened 08/28/2018; culture done on the left dorsal foot showed coag negative staph both staph epidermidis and Lugdunensis. I think this is worthwhile initiating systemic treatment. I will use doxycycline given his long list of allergies. The area on the left heel slightly improved but still requiring debridement. ooThe large wound on the buttock is just about closed whereas the smaller one is larger. Using  silver alginate in this area 09/04/2018; patient is completing his doxycycline for the left foot although this continues to be [name] very difficult wound area with very adherent necrotic debris. We are using silver alginate to all his wounds right foot left foot and the small wounds on his buttock, silver collagen on the left heel. 09/11/2018; once again this patient has intense erythema and swelling of the left forefoot. Lesser degrees of erythema in the right foot. He has [name] long list of allergies and intolerances. I will reinstitute doxycycline. 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 Using silver alginate ooLeft heel also looks better using silver collagen ooUnfortunately both the areas on his feet look worse. The area on the left first second webspace is now gone through to the plantar part of his foot. The area on the left foot anteriorly is irritated with erythema and swelling in the forefoot. 09/25/2018 ooHis wound on the left plantar heel looks better. Using silver collagen ooThe area on the left buttock 2 small remnant areas. One is closed one is still open. Using silver alginate ooThe areas between both his first and second toes look worse. This in spite of long-standing antifungal therapy with ketoconazole and silver alginate which should have antifungal activity ooHe has small areas around his original wound on the left calf one is on the bottom of the original scar tissue and one superiorly both of these are small and superficial but again given wound history in this site this is worrisome 10/02/2018 ooLeft plantar heel continues to gradually contract using silver collagen ooLeft buttock wound is unchanged using silver alginate ooThe areas on his dorsal feet between his first and second toes bilaterally look about the same. 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 Small open area here. We have been using silver alginate 10/09/2018; ooLeft plantar heel continues to look healthy and contract. No debridement is required ooLeft buttock slightly smaller with [name] tape injury wound  just below which was new this week ooDorsal feet somewhat improved I have been using clindamycin ooLeft lateral looks lower extremity the actual open area looks worse although [name] lot of this is epithelialized. I am going to change to silver collagen today He has [name] lot more swelling in the right leg although this is not pitting not red and not particularly warm there is [name] lot of spasm in the right leg usually indicative of people with paralysis of some underlying discomfort. We have reviewed his vascular status from 2017 he had [name] left greater saphenous vein ablation. I wonder about referring him back to vascular surgery if the area on the left leg continues to deteriorate. 10/16/2018 in today for follow-up and management of multiple lower extremity ulcers. His left Buttock wound is much lower smaller and almost closed completely. The wound to the left ankle has began to reopen with Epithelialization and some adherent slough. He has multiple new areas to the left foot and leg. The left dorsal foot without much improvement. Wound present between left great webspace and 2nd toe. Erythema and edema present right leg. Right LE ultrasound obtained on 10/10/18 was negative for DVT. 10/23/2018; ooLeft buttock is closed over. Still dry macerated skin but there is no open wound. I suspect this is chronic pressure/moisture ooLeft lateral calf is quite [name] bit worse than when I saw this last. There is clearly drainage here he has macerated skin into the left plantar heel. We will change the primary dressing to alginate ooLeft dorsal foot has some improvement in overall wound  area. Still using clindamycin and silver alginate ooRight dorsal foot about the same as the left using clindamycin and silver alginate ooThe erythema in the right leg has resolved. He is DVT rule out was negative ooLeft heel pressure area required debridement although the wound is smaller and the surface is health 10/26/2018 ooThe patient came back in for his nurse check today predominantly because of the drainage coming out of the left lateral leg with [name] recent reopening of his original wound on the left lateral calf. He comes in today with [name] large amount of surrounding erythema around the wound extending from the calf into the ankle and even in the area on the dorsal foot. He is not systemically unwell. He is not febrile. Nevertheless this looks like cellulitis. We have been using silver alginate to the area. I changed him to [name] regular visit and I am going to prescribe him doxycycline. The rationale here is [name] long list of medication intolerances and [name] history of MRSA. I did not see anything that I thought would provide [name] valuable culture 10/30/2018 ooFollow-up from his appointment 4 days ago with really an extensive area of cellulitis in the left calf left lateral ankle and left dorsal foot. I put him on doxycycline. He has [name] long list of medication allergies which are true allergy reactions. 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 In any case he is [name] lot better today. The erythema especially in the anterior and lateral left calf is better. He still has left ankle erythema. He also is complaining about increasing edema in the right leg we have only been using Kerlix Coban and he has been doing the wraps at home. Finally he has [name] spotty rash on the medial part of his upper left calf which looks like folliculitis or perhaps wrap occlusion type injury. Small superficial macules not pustules  11/06/18 patient arrives today with  again [name] considerable degree of erythema around the wound on the left lateral calf extending into the dorsal ankle and dorsal foot. This is [name] lot worse than when I saw this last week. He is on doxycycline really with not [name] lot of improvement. He has not been systemically unwell Wounds on the; left heel actually looks improved. Original area on the left foot and proximity to the first and second toes looks about the same. He has superficial areas on the dorsal foot, anterior calf and then the reopening of his original wound on the left lateral calf which looks about the same ooThe only area he has on the right is the dorsal webspace first and second which is smaller. ooHe has [name] large area of dry erythematous skin on the left buttock small open area here. 11/13/2018; the patient arrives in much better condition. The erythema around the wound on the left lateral calf is [name] lot better. Not sure whether this was the clindamycin or the TCA and ketoconazole or just in the improvement in edema control [stasis dermatitis]. In any case this is [name] lot better. The area on the left heel is very small and just about resolved using silver collagen we have been using silver alginate to the areas on his dorsal feet 11/20/2018; his wounds include the left lateral calf, left heel, dorsal aspects of both feet just proximal to the first second webspace. He is stable to slightly improved. I did not think any changes to his dressings were going to be necessary 11/27/2018 he has [name] reopening on the left buttock which is surrounded by what looks like tinea or perhaps some other form of dermatitis. The area on the left dorsal foot has some erythema around it I have marked this area but I am not sure whether this is cellulitis or not. Left heel is not closed. Left calf the reopening is really slightly longer and probably worse 1/13; in general things look better and smaller except for the left dorsal foot. Area on the left heel is  just about closed, left buttock looks better only [name] small wound remains in the skin looks better [using Lotrisone] 1/20; the area on the left heel only has [name] few remaining open areas here. Left lateral calf about the same in terms of size, left dorsal foot slightly larger right lateral foot still not closed. The area on the left buttock has no open wound and the surrounding skin looks [name] lot better 1/27; the area on the left heel is closed. Left lateral calf better but still requiring extensive debridements. The area on his left buttock is closed. He still has the open areas on the left dorsal foot which is slightly smaller in the right foot which is slightly expanded. We have been using Iodoflex on these areas as well 2/3; left heel is closed. Left lateral calf still requiring debridement using Iodoflex there is no open area on his left buttock however he has dry scaly skin over [name] large area of this. Not really responding well to the Lotrisone. Finally the areas on his dorsal feet at the level of the first second webspace are slightly smaller on the right and about the same on the left. Both of these vigorously debrided with Anasept and gauze 2/10; left heel remains closed he has dry erythematous skin over the left buttock but there is no open wound here. Left lateral leg has come in and with. Still requiring debridement  we have been using Iodoflex here. Finally the area on the left dorsal foot and right dorsal foot are really about the same extremely dry callused fissured areas. He does not yet have [name] dermatology appointment 2/17; left heel remains closed. He has [name] new open area on the left buttock. The area on the left lateral calf is bigger longer and still covered in necrotic debris. No major change in his foot areas bilaterally. I am awaiting for [name] dermatologist to look on this. We have been using ketoconazole I do not know that this is been doing any good at all. 2/24; left heel remains closed.  The left buttock wound that was new reopening last week looks better. The left lateral calf appears better also although still requires debridement. The major area on his foot is the left first second also requiring debridement. We have been putting Prisma on all wounds. I do not believe that the ketoconazole has done too much good for his feet. He will use Lotrisone I am going to give him [name] 2-week course of terbinafine. We still do not have [name] dermatology appointment 3/2 left heel remains closed however there is skin over bone in this area I pointed this out to him today. The left buttock wound is epithelialized but still does not look completely stable. The area on the left leg required debridement were using silver collagen here. With regards to his feet we changed to Lotrisone last week and silver alginate. 3/9; left heel remains closed. Left buttock remains closed. The area on the right foot is essentially closed. The left foot remains unchanged. Slightly smaller on the left lateral calf. Using silver collagen to both of these areas 3/16-Left heel remains closed. Area on right foot is closed. Left lateral calf above the lateral malleolus open wound requiring debridement with easy bleeding. Left dorsal wound proximal to first toe also debrided. Left ischial area open new. Patient has been using Prisma with wrapping every 3 days. Dermatology appointment is apparently tomorrow.Patient has completed his terbinafine 2-week course with some apparent improvement according to him, there is still flaking and dry skin in his foot on the left 3/23; area on the right foot is reopened. The area on the left anterior foot is about the same still [name] very necrotic adherent surface. He still has the area on the left leg and reopening is on the left buttock. He apparently saw dermatology although I do not have [name] note. According to the patient who is usually fairly well informed they did not have any good ideas. Put  him on oral terbinafine which she is been on before. 3/30; using silver collagen to all wounds. Apparently his dermatologist put him on doxycycline and rifampin presumably some culture grew staph. I do not have this result. He remains on terbinafine although I have used terbinafine on him before 4/6; patient has had [name] fairly substantial reopening on the right foot between the first and second toes. He is finished his terbinafine and I believe is on doxycycline and rifampin still as prescribed by dermatology. We have been using silver collagen to all his wounds although the patient reports that he thinks silver alginate does better on the wounds on his buttock. 4/13; the area on his left lateral calf about the same size but it did not require debridement. ooLeft dorsal foot just proximal to the webspace between the first and second toes is about the same. Still nonviable surface. I note some superficial bronze discoloration of the  dorsal part of his foot ooRight dorsal foot just proximal to the first and second toes also looks about the same. I still think there may be the same discoloration I noted above on the left ooLeft buttock wound looks about the same 4/20; left lateral calf appears to be gradually contracting using silver collagen. 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 The left dorsal foot wound is debrided of tightly adherent necrotic debris and really cleans up quite nicely. The right area is worse with expansion. I did not debride this it is now over the base of the second toe ooThe area on his left buttock is smaller no debridement is required using silver collagen 5/4; left calf continues to make good progress. ooHe arrives with erythema around the wounds on his dorsal foot which even extends to the plantar aspect. Very concerning for coexistent infection. He is finished the erythromycin I gave him for possible  erythrasma this does not seem to have helped. ooThe area on the left foot is about the same base of the dorsal toes ooIs area on the buttock looks improved on the left 5/11; left calf and left buttock continued to make good progress. Left foot is about the same to slightly improved. ooMajor problem is on the right foot. He has not had an x-ray. Deep tissue culture I did last week showed both Enterobacter and E. coli. I did not change the doxycycline I put him on empirically although neither 1 of these were plated to doxycycline. He arrives today with the erythema looking worse on both the dorsal and plantar foot. Macerated skin on the bottom of the foot. he has not been systemically unwell 5/18-Patient returns at 1 week, left calf wound appears to be making some progress, left buttock wound appears slightly worse than last time, left foot wound looks slightly better, right foot redness is marginally better. X-ray of both feet show no air or evidence of osteomyelitis. Patient is finished his Omnicef and terbinafine. He continues to have macerated skin on the bottom of the left foot as well as right 5/26; left calf wound is better, left buttock wound appears to have multiple small superficial open areas with surrounding macerated skin. X-rays that I did last time showed no evidence of osteomyelitis in either foot. He is finished cefdinir and doxycycline. I do not think that he was on terbinafine. He continues to have [name] large superficial open area on the right foot anterior dorsal and slightly between the first and second toes. I did send him to dermatology 2 months ago or so wondering about whether they would do [name] fungal scraping. I do not believe they did but did do [name] culture. We have been using silver alginate to the toe areas, he has been using antifungals at home topically either ketoconazole or Lotrisone. We are using silver collagen on the left foot, silver alginate on the right, silver  collagen on the left lateral leg and silver alginate on the left buttock 6/1; left buttock area is healed. We have the left dorsal foot, left lateral leg and right dorsal foot. We are using silver alginate to the areas on both feet and silver collagen to the area on his left lateral calf 6/8; the left buttock apparently reopened late last week. He is not really sure how this happened. He is tolerating the terbinafine. Using silver alginate to all wounds 6/15; left buttock wound is larger than last week but still superficial. ooCame  in the clinic today with [name] report of purulence from the left lateral leg I did not identify any infection ooBoth areas on his dorsal feet appear to be better. He is tolerating the terbinafine. Using silver alginate to all wounds 6/22; left buttock is about the same this week, left calf quite [name] bit better. His left foot is about the same however he comes in with erythema and warmth in the right forefoot once again. Culture that I gave him in the beginning of May showed Enterobacter and E. coli. I gave him doxycycline and things seem to improve although neither 1 of these organisms was specifically plated. 6/29; left buttock is larger and dry this week. Left lateral calf looks to me to be improved. Left dorsal foot also somewhat improved right foot completely unchanged. The erythema on the right foot is still present. He is completing the Ceftin dinner that I gave him empirically [see discussion above.) 7/6 - All wounds look to be stable and perhaps improved, the left buttock wound is slightly smaller, per patient bleeds easily, completed ceftin, the right foot redness is less, he is on terbinafine 7/13; left buttock wound about the same perhaps slightly narrower. Area on the left lateral leg continues to narrow. Left dorsal foot slightly smaller right foot about the same. We are using silver alginate on the right foot and Hydrofera Blue to the areas on the left. Unna boot  on the left 2 layer compression on the right 7/20; left buttock wound absolutely the same. Area on lateral leg continues to get better. Left dorsal foot require debridement as did the right no major change in the 7/27; left buttock wound the same size necrotic debris over the surface. The area on the lateral leg is closed once again. His left foot looks better right foot about the same although there is some involvement now of the posterior first second toe area. He is still on terbinafine which I have given him for [name] month, not certain [name] centimeter major change 06/25/19-All wounds appear to be slightly improved according to report, left buttock wound looks clean, both foot wounds have minimal to no debris the right dorsal foot has minimal slough. We are using Hydrofera Blue to the left and silver alginate to the right foot and ischial wound. 8/10-Wounds all appear to be around the same, the right forefoot distal part has some redness which was not there before, however the wound looks clean and small. Ischial wound looks about the same with no changes 8/17; his wound on the left lateral calf which was his original chronic venous insufficiency wound remains closed. Since I last saw him the areas on the left dorsal foot right dorsal foot generally appear better but require debridement. The area on his left initial tuberosity appears somewhat larger to me perhaps hyper granulated and bleeds very easily. We have been using Hydrofera Blue to the left dorsal foot and silver alginate to everything else 8/24; left lateral calf remains closed. The areas on his dorsal feet on the webspace of the first and second toes bilaterally both look better. The area on the left buttock which is the pressure ulcer stage II slightly smaller. I change the dressing to Hydrofera Blue to all areas 8/31; left lateral calf remains closed. The area on his dorsal feet bilaterally look better. Using Hydrofera Blue. Still requiring  debridement on the left foot. No change in the left buttock pressure ulcers however 9/14; left lateral calf remains closed. Dorsal feet  look quite [name] bit better than 2 [name] ago. Flaking dry skin also [name] lot better with the ammonium lactate I gave him 2 [name] ago. The area on the left buttock is improved. He states that his Roho cushion developed [name] leak and he is getting [name] new one, in the interim he is offloading this vigorously 9/21; left calf remains closed. Left heel which was [name] possible DTI looks better this week. He had macerated tissue around the left dorsal foot right foot looks satisfactory and improved left buttock wound. I changed his dressings to his feet to silver alginate bilaterally. Continuing Hydrofera Blue on the left buttock. 9/28 left calf remains closed. Left heel did not develop anything [possible DTI] dry flaking skin on the left dorsal foot. Right foot looks satisfactory. Improved left buttock wound. We are using silver alginate on his feet Hydrofera Blue on the buttock. I have asked him to go back to the Lotrisone on his feet including the wounds and surrounding areas 10/5; left calf remains closed. The areas on the left and right feet about the same. [name] lot of this is epithelialized however debris over the remaining open areas. He is using Lotrisone and silver alginate. The area on the left buttock using Hydrofera Blue 10/26. Patient has been out for 3 [name] secondary to Covid concerns. He tested negative but I think his wife tested positive. He comes in today with the left foot substantially worse, right foot about the same. Even more concerning he states that the area on his left buttock closed over but then reopened and is considerably deeper in one aspect than it was before [stage III wound] 11/2; left foot really about the same as last week. Quarter sized wound on the dorsal foot just proximal to the first second toes. Surrounding erythema with areas of denuded epithelium.  This is not really much different looking. Did not look like cellulitis this time however. ooRight foot area about the same.. We have been using silver alginate alginate on his toes ooLeft buttock still substantial irritated skin around the wound which I think looks somewhat better. We have been using Hydrofera Blue here. 11/9; left foot larger than last week and [name] very necrotic surface. Right foot I think is about the same perhaps slightly smaller. Debris around the circumference also addressed. Unfortunately on the left buttock there is been [name] decline. Satellite lesions below the major wound distally and now [name] an additional one posteriorly we have been using Hydrofera Blue but I think this is [name] pressure issue 11/16; left foot ulcer dorsally again [name] very adherent necrotic surface. Right foot is about the same. Not much change in the pressure ulcer on his left buttock. 11/30; left foot ulcer dorsally basically the same as when I saw him 2 [name] ago. Very adherent fibrinous debris on the wound surface. Patient reports [name] lot of drainage as well. 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 We have been using Iodoflex, patient changed back to alginate because of the drainage. Area on his right dorsal foot really looks benign with [name] healthier surface certainly [name] lot better than on the left. Left buttock wounds all improved using Hydrofera Blue 12/7; left dorsal foot again no improvement. Tightly adherent debris. PCR culture I did last week only showed likely skin contaminant. I have gone ahead and done [name] punch biopsy of this which is about the last thing in terms of investigations I can think to do.  He has known venous insufficiency and venous hypertension and this could be the issue here. The area on the right foot is about the same left buttock slightly worse according to our intake nurse secondary to [facility] Blue sticking to the wound 12/14; biopsy of  the left foot that I did last time showed changes that could be related to wound healing/chronic stasis dermatitis phenomenon no neoplasm. We have been using silver alginate to both feet. I change the one on the left today to Sorbact and silver alginate to his other 2 wounds 12/28; the patient arrives with the following problems; ooMajor issue is the dorsal left foot which continues to be [name] larger deeper wound area. Still with [name] completely nonviable surface ooParadoxically the area mirror image on the right on the right dorsal foot appears to be getting better. ooHe had some loss of dry denuded skin from the lower part of his original wound on the left lateral calf. Some of this area looked [name] little vulnerable and for this reason we put him in wrap that on this side this week ooThe area on his left buttock is larger. He still has the erythematous circular area which I think is [name] combination of pressure, sweat. This does not look like cellulitis or fungal dermatitis 11/26/2019; -Dorsal left foot large open wound with depth. Still debris over the surface. Using Sorbact ooThe area on the dorsal right foot paradoxically has closed over [facility] has [name] reopening on the left ankle laterally at the base of his original wound that extended up into the calf. This appears clean. ooThe left buttock wound is smaller but with very adherent necrotic debris over the surface. We have been using silver alginate here as well The patient had arterial studies done in 2017. He had biphasic waveforms at the dorsalis pedis and posterior tibial bilaterally. ABI in the left was 1.17. Digit waveforms were dampened. He has slight spasticity in the great toes I do not think [name] TBI would be possible 1/11; the patient comes in today with [name] sizable reopening between the first and second toes on the right. This is not exactly in the same location where we have been treating wounds previously. According to our intake nurse this was  actually fairly deep but 0.6 cm. The area on the left dorsal foot looks about the same the surface is somewhat cleaner using Sorbact, his MRI is in 2 days. We have not managed yet to get arterial studies. The new reopening on the left lateral calf looks somewhat better using alginate. The left buttock wound is about the same using alginate 1/18; the patient had his ARTERIAL studies which were quite normal. ABI in the right at 1.13 with triphasic/biphasic waveforms on the left ABI 1.06 again with triphasic/biphasic waveforms. It would not have been possible to have done [name] toe brachial index because of spasticity. We have been using Sorbac to the left foot alginate to the rest of his wounds on the right foot left lateral calf and left buttock 1/25; arrives in clinic with erythema and swelling of the left forefoot worse over the first MTP area. This extends laterally dorsally and but also posteriorly. Still has an area on the left lateral part of the lower part of his calf wound it is eschared and clearly not closed. ooArea on the left buttock still with surrounding irritation and erythema. ooRight foot surface wound dorsally. The area between the right and first and second toes appears better. 2/1; ooThe left foot  wound is about the same. Erythema slightly better I gave him [name] week of doxycycline empirically ooRight foot wound is more extensive extending between the toes to the plantar surface ooLeft lateral calf really no open surface on the inferior part of his original wound however the entire area still looks vulnerable ooAbsolutely no improvement in the left buttock wound required debridement. 2/8; the left foot is about the same. Erythema is slightly improved I gave him clindamycin last week. ooRight foot looks better he is using Lotrimin and silver alginate ooHe has [name] breakdown in the left lateral calf. Denuded epithelium which I have removed ooLeft buttock about the same were using  Hydrofera Blue 2/15; left foot is about the same there is less surrounding erythema. Surface still has tightly adherent debris which I have debriding however not making any progress ooRight foot has [name] substantial wound on the medial right second toe between the first and second webspace. ooStill an open area on the left lateral calf distal area. ooButtock wound is about the same 2/22; left foot is about the same less surrounding erythema. Surface has adherent debris. Polymen Ag Right foot area significant wound between the first and second toes. We have been using silver alginate here Left lateral leg polymen Ag at the base of his original venous insufficiency wound ooLeft buttock some improvement here 3/1; ooRight foot is deteriorating in the first second toe webspace. Larger and more substantial. We have been using silver alginate. ooLeft dorsal foot about the same markedly adherent surface debris using PolyMem Ag ooLeft lateral calf surface debris using PolyMem AG ooLeft buttock is improved again using PolyMem Ag. ooHe is completing his terbinafine. The erythema in the foot seems better. He has been on this for 2 [name] 3/8; no improvement in any wound area in fact he has [name] small open area on the dorsal midfoot which is new this week. He has not gotten his foot x-rays yet 3/15; his x-rays were both negative for osteomyelitis of both feet. No major change in any of his wounds on the extremities however his buttock wounds are better. We have been using polymen on the buttocks, left lower leg. Iodoflex on the left foot and silver alginate on the right 3/22; arrives in clinic today with the 2 major issues are the improvement in the left dorsal foot wound which for once actually looks healthy with [name] nice healthy wound surface without debridement. Using Iodoflex here. Unfortunately on the left lateral calf which is in the distal part of his original wound he came to the clinic here for  there was purulent drainage noted some increased breakdown scattered around the original area and [name] small area proximally. We we are using polymen here will change to silver alginate today. His buttock wound on the left is better and I think the area on the right first second toe webspace is also improved 3/29; left dorsal foot looks better. Using Iodoflex. Left ankle culture from deterioration last time grew E. coli, Enterobacter and Enterococcus. I will give him [name] course of cefdinir although that will not cover Enterococcus. The area on the right foot in the webspace of the first and second toe lateral first toe looks better. The area on his buttock is about healed Vascular appointment is on April 21. This is to look at his venous system vis--vis continued breakdown of the wounds on the left including the left lateral leg and left dorsal foot he. He has had previous ablations on this side  4/5; the area between the right first and second toes lateral aspect of the first toe looks better. Dorsal aspect of the left first toe on the left foot also improved. Unfortunately the left lateral lower leg is larger and there is [name] second satellite wound superiorly. The usual superficial abrasions on the left buttock overall better but certainly not closed 4/12; the area between the right first and second toes is improved. Dorsal aspect of the left foot also slightly smaller with [name] vibrant healthy looking surface. No real change in the left lateral leg and the left buttock wound is healed He has an unaffordable co-pay for Apligraf. Appointment with vein and vascular with regards to the left leg venous part of the circulation is on 4/21 4/19; we continue to see improvement in all wound areas. Although this is minor. He has his vascular appointment on 4/21. The area on the left buttock has not reopened although right in the center of this area the skin looks somewhat threatened 4/26; the left buttock is  unfortunately reopened. In general his left dorsal foot has [name] healthy surface and looks somewhat smaller although it was not measured as such. The area between his first and second toe webspace on the right as [name] small wound against the first toe. The patient saw vascular surgery. The real question I was asking was about the small saphenous vein on the left. He has previously ablated left greater saphenous vein. Nothing further was commented on on the left. Right greater saphenous vein without reflux at the saphenofemoral junction or proximal thigh there was no indication for ablation of the right greater saphenous vein duplex was negative for DVT bilaterally. They did not think there was anything from [name] point of view that could be offered. They ABIs within normal limits 5/3; only small open area on the left buttock. The area on the left lateral leg which was his original venous reflux is now 2 wounds both which look clean. We are using Iodoflex on the left dorsal foot which looks healthy and smaller. He is down to [name] very tiny area between the right first and second toes, using silver alginate 5/10; all of his wounds appear better. We have much better edema control in 4 layer compression on the left. This may be the factor that is allowing the left foot and left lateral calf to heal. He has external compression garments at home 04/14/20-All of his wounds are progressing well, the left forefoot is practically closed, left ischium appears to be about the same, right toe webspace is also smaller. The left lateral leg is about the same, continue using Hydrofera Blue to this, silver alginate to the ischium, Iodoflex to the toe space on the right 6/7; most of his wounds outside of the left buttock are doing well. The area on the left lateral calf and left dorsal foot are smaller. The area on the right foot in between the first and second toe webspace is barely visible although he still says  there is some drainage here is the only reason I did not heal this out. ooUnfortunately the area on the left buttock almost looks like he has [name] skin tear from tape. He has open wound and then [name] large flap of skin that we are trying to get adherence over an area just next to the remaining wound 6/21; 2 week follow-up. I believe is been here for nurse visits. Miraculously the area between his first and second toes on  the left dorsal foot is closed over. Still open on the right first second web space. The left lateral calf has 2 open areas. Distally this is more superficial. The proximal area had [name] little more depth and required debridement of adherent necrotic material. His buttock wound is actually larger we have been using silver alginate here 6/28; the patient's area on the left foot remains closed. Still open wet area between the first and second toes on the right and also extending into the plantar aspect. We have been using silver alginate in this location. He has 2 areas on the left lower leg part of his original long wounds which I think are better. We have been using Hydrofera Blue here. Hydrofera Blue to the left buttock which is stable 7/12; left foot remains closed. Left ankle is closed. May be [name] small area between his right first and second toes the only truly open area is on the left buttock. We have been using Hydrofera Blue here 7/19; patient arrives with marked deterioration especially in the left foot and ankle. We did not put him in [name] compression wrap on the left last week in fact he wore his juxta lite stockings on either side although he does not have an underlying stocking. He has [name] reopening on the left dorsal foot, left lateral ankle and [name] new area on the right dorsal ankle. More worrisome is the degree of erythema on the left foot extending on the lateral foot into the lateral lower leg on the left 7/26; the patient had erythema and drainage from the lateral left ankle last  week. Culture of this grew MRSA resistant to doxycycline and clindamycin which are the 2 antibiotics we usually use with this patient who has multiple antibiotic allergies including linezolid, trimethoprim sulfamethoxazole. I had give him an empiric doxycycline and he comes in the area certainly looks somewhat better although it is blotchy in his lower leg. He has not been systemically unwell. He has had areas on the left dorsal foot which is [name] reopening, chronic wounds on the left lateral ankle. Both of these I think are secondary to chronic venous insufficiency. The area between his first and second toes is closed as far as I can tell. He had [name] new wrap injury on the right dorsal ankle last week. Finally he has an area on the left buttock. We have been using silver alginate to everything except the left buttock we are using Hydrofera Blue 06/30/20-Patient returns at 1 week, has been given [name] sample dose pack of NUZYRA which is [name] tetracycline derivative [omadacycline], patient has completed those, we have been using silver alginate to almost all the wounds except the left ischium where we are using Hydrofera Blue all of them look better 8/16; since I last saw the patient he has been doing well. The area on the left buttock, left lateral ankle and left foot are all closed today. He has completed the [place] I gave him last time and tolerated this well. He still has open areas on the right dorsal ankle and in the right first second toe area which we are using silver alginate. 8/23; we put him in his bilateral external compression stockings last week as he did not have anything open on either leg except for concerning area between the right first and second toe. He comes in today with an area on the left dorsal foot slightly more proximal than the original wound, the left lateral foot but this is actually [name]  continuation of the area he had on the left lateral ankle from last time. As well he is opened up on  the left buttock again. 8/30; comes in today with things looking [name] lot better. The area on the left lower ankle has closed down as has the left foot but with eschar in both areas. The area on the dorsal right ankle is also epithelialized. Very little remaining of the left buttock wound. We have been using silver alginate on all wound areas 9/13; the area in the first second toe webspace on the right has fully epithelialized. He still has some vulnerable epithelium on the right and the ankle and the dorsal foot. He notes weeping. He is using his juxta lite stocking. On the left again the left dorsal foot is closed left lateral ankle is closed. We went to the juxta lite stocking here as well. ooStill vulnerable in the left buttock although only 2 small open areas remain here 9/27; 2-week follow-up. We did not look at his left leg but the patient says everything is closed. He is [name] bit disturbed by the amount of edema in his left foot he is using juxta lite stockings but asking about over the toes stockings which would be 30/40, will talk to him next time. According to him there is no open wound on either the left foot or the left ankle/calf He has an open area on the dorsal right calf which I initially point [name] wrap injury. He has superficial remaining wound on the left ischial tuberosity been using silver alginate although he says this sticks to the wound 10/5; we gave him 2-week follow-up but he called yesterday expressing some concerns about his right foot right ankle and the left buttock. He came in early. There is still no open areas on the left leg and that still in his juxta lite stocking 10/11; he only has 1 small area on the left buttock that remains measuring millimeters 1 mm. Still has the same irritated skin in this area. We recommended zinc oxide when this eventually closes and pressure relief is meticulously is he can do this. He still has an area on the dorsal part of his right first  through third toes which is [name] bit irritated and still open and on the dorsal ankle near the crease of the ankle. We have been using silver alginate and using his own stocking. He has nothing open on the left leg or foot 10/25; 2-week follow-up. Not nearly as good on the left buttock as I was hoping. For open areas with 5 looking threatened small. He has the erythematous irritated chronic skin in this area. oo1 area on the right dorsal ankle. He reports this area bleeds easily ooRight dorsal foot just proximal to the base of his toes ooWe have been using silver alginate. 11/8; 2-week follow-up. Left buttock is about the same although I do not think the wounds are in the same location we have been using silver alginate. I have asked him to use zinc oxide on the skin around the wounds. ooHe still has [name] small area on the right dorsal ankle he reports this bleeds easily ooRight dorsal foot just proximal to the base of the toes does not have anything open although the skin is very dry and scaly ooHe has [name] new opening on the nailbed of the left great toe. Nothing on the left ankle 11/29; 3-week follow-up. Left buttock has 2 open areas. And washing of these wounds today started  bleeding easily. Suggesting very friable tissue. We have been using silver alginate. Right dorsal ankle which I thought was initially [name] wrap injury we have been using silver alginate. Nothing open between the toes that I can see. He states the area on the left dorsal toe nailbed healed after the last visit in 2 or 3 days 12/13; 3-week follow-up. His left buttock now has 3 open areas but the original 2 areas are smaller using polymen here. Surrounding skin looks better. The right dorsal ankle is closed. He has [name] small opening on the right dorsal foot at the level of the third toe. In general the skin looks better here. He is wearing his juxta lite stocking on the left leg says there is nothing open 11/24/2020; 3 [name] follow-up.  His left buttock still has the 3 open areas. We have been using polymen but due to lack of response he changed to [facility] area. Surrounding skin is dry erythematous and irritated looking. There is no evidence of infection either bacterial or fungal however there is loss of surface epithelium ooHe still has very dry skin in his foot causing irritation and erythema on the dorsal part of his toes. This is not responded to prolonged courses of antifungal simply looks dry and irritated 1/24; left buttock area still looks about the same he was unable to find the triad ointment that we had suggested. The area on the right lower leg just above the dorsal ankle has reopened and the areas on the right foot between the first second and second third toes and scaling on the bottom of the foot has been about the same for quite some time now. been using silver alginate to all wound areas 2/7; left buttock wound looked quite good although not much smaller in terms of surface area surrounding skin looks better. Only [name] few dry flaking areas on the right foot in between the first and second toes the skin generally looks better here [ammonium lactate]. Finally the area on the right dorsal ankle is closed 2/21; ooThere is no open area on the right foot even between the right first and second toe. Skin around this area dorsally and plantar aspects look better. ooHe has [name] reopening of the area on the right ankle just above the crease of the ankle dorsally. I continue to think that this is probably friction from spasms may be even this time with his stocking under the compression stockings. ooWounds on his left buttock look about the same there [name] couple of areas that have reopened. He has [name] total square area of loss of epithelialization. This does not look like infection it looks like [name] contact dermatitis but I just cannot determine to what 3/14; there is nothing on the right foot between the first and second  toes this was carefully inspected under illumination. Some chronic irritation on the dorsal part of his foot from toes 1-3 at the base. Nothing really open here substantially. Still has an area on the right foot/ankle that is actually larger and hyper granulated. His buttock area on the left is just about closed however he has chronic inflammation with loss of the surface epithelial layer 3/28; 2-week follow-up. In clinic today with [name] new wound on the left anterior mid tibia. Says this happened about 2 [name] ago. He is not really sure how wonders about the spasticity of his legs at night whether that could have caused this other than that he does not have [name] good idea. He  has been using topical antibiotics and silver alginate. The area on his right dorsal ankle seems somewhat better. ooFinally everything on his left buttock is closed. 4/11; 2-week follow-up. All of his wounds are better except for the area over the ischium and left buttock which have opened up widely again. At least part of this is covered in necrotic fibrinous material another part had rolled nonviable skin. The area on the right ankle, left anterior mid tibia are both [name] lot better. He had no open wounds on either foot including the areas between the first and second toes 4/25; patient presents for 2-week follow-up. He states that the wounds are overall stable. He has no complaints today and states he is using Hydrofera Blue to open wounds. 5/9; have not seen this man in over [name] month. For my memory he has open areas on the left mid tibia and right ankle. T oday he has new open area on the right dorsal foot which we have not had [name] problem with recently. He has the sustained area on the left buttock He is also changed his insurance at the beginning of the year [employer]. We will need prior authorizations for debridement 5/23; patient presents for 2-week follow-up. He has prior authorizations for debridement. He denies any issues  in the past 2 [name] with his wound care. He has been using Hydrofera Blue to all the wounds. He does report [name] circular rash to the upper left leg that is new. He denies acute signs of infection. 6/6; 2-week follow-up. The patient has open wounds on the left buttock which are worse than the last time I saw this about [name] month ago. He also has [name] new area to me on the left anterior mid tibia with some surrounding erythema. The area on the dorsal ankle on the right is closed but I think this will be [name] friction injury every time this area is exposed to either our wraps or his compression stockings caused by unrelenting spasms in this leg. 6/20; 2-week follow-up. oo The patient has open wounds on the left buttock which is about the same. Using [facility] here. - The left mid tibia has [name] static amount of surrounding erythema. Also [name] raised area in the center. We have been using Hydrofera Blue here. ooooFinally he has broken down in his dorsal right foot extending between the first and second toes and going to the base of the first and second toe webspace. I have previously assumed that this was severe venous hypertension 7/5; 2-week follow-up oo The left buttock wound actually looks better. We are using Hydrofera Blue. He has extensive skin irritation around this area and I have not really been able to get that any better. I have tried Lotrisone i.e. antifungals and steroids. More most recently we have just been using Coloplast really looks about the same. oo The left mid tibia which was new last week culture to have very resistant staph aureus. Not only methicillin-resistant but doxycycline resistant. The patient has [name] plethora of antibiotic allergies including sulfa, linezolid. I used topical bacitracin on this but he has not started this yet. oo In addition he has an expanding area of erythema with [name] wound on the dorsal right foot. I did [name] deep tissue culture of this area today 7/12; oo Left  buttock area actually looks better surrounding skin also looks less irritated. oo Left mid tibia looks about the same. He is using bacitracin this is not worse oo Right dorsal  foot looks about the same as well. oo The left first toe also looks about the same 7/19; left buttock wound continues to improve in terms of open areas oo Left mid tibia is still concerning amount of swelling he is using bacitracin oo Dorsal left first toe somewhat smaller oo Right dorsal foot somewhat smaller Objective Constitutional Sitting or standing Blood Pressure is within target range for patient.. Pulse regular and within target range for patient.Marland Kitchen Respirations regular, non-labored and within target range.. Temperature is normal and within the target range for the patient.Marland Kitchen Appears in no distress. Vitals Time Taken: 7:44 AM, Height: 70 in, Weight: 216 lbs, BMI: 31, Temperature: 98.1 F, Pulse: 130 bpm, Respiratory Rate: 16 breaths/min, Blood Pressure: 125/79 mmHg. General Notes: Wound exam oo Right dorsal foot erythema better. Wound is narrow but with somewhat less wet. He has debris on the surface oo Left anterior mid tibia. Still swollen but less erythematous. Cultured staph here [name] month ago. Between the resistances of what I have cultured in his allergies there is very little oral to offer him. oo Left dorsal first toe. I used [name] #3 curette to remove dry flaking skin around the circumference this actually looks fairly healthy. oo Left buttock still with the area of erythema however the open wounds are smaller. oo The left heel has an area of DTI. I talked to him about this Integumentary (Hair, Skin) Wound #41R status is Open. Original cause of wound was Gradually Appeared. The date acquired was: 03/16/2020. The wound has been in treatment 64 [name]. The wound is located on the Left Ischium. The wound measures 0.4cm length x 0.3cm width x 0.1cm depth; 0.094cm^2 area and 0.009cm^3 volume. There is Fat  Layer (Subcutaneous Tissue) exposed. There is no tunneling or undermining noted. There is [name] medium amount of sanguinous drainage noted. The wound margin is distinct with the outline attached to the wound base. There is large (67-100%) red, pink, friable granulation within the wound bed. There is no necrotic tissue within the wound bed. Wound #51 status is Open. Original cause of wound was Trauma. The date acquired was: 01/26/2021. The wound has been in treatment 16 [name]. The wound is located on the Left,Anterior Lower Leg. The wound measures 1.5cm length x 0.9cm width x 0.1cm depth; 1.06cm^2 area and 0.106cm^3 volume. There is Fat Layer (Subcutaneous Tissue) exposed. There is no tunneling or undermining noted. There is [name] medium amount of serosanguineous drainage noted. The wound margin is distinct with the outline attached to the wound base. There is large (67-100%) red, friable, hyper - granulation within the wound bed. There is no necrotic tissue within the wound bed. Wound #52 status is Open. Original cause of wound was Gradually Appeared. The date acquired was: 03/27/2021. The wound has been in treatment 10 [name]. The wound is located on the Right,Dorsal Foot. The wound measures 0.5cm length x 3cm width x 0.1cm depth; 1.178cm^2 area and 0.118cm^3 volume. There is Fat Layer (Subcutaneous Tissue) exposed. There is no tunneling or undermining noted. There is [name] medium amount of serosanguineous drainage noted. The wound margin is distinct with the outline attached to the wound base. There is small (1-33%) pink granulation within the wound bed. There is [name] large (67-100%) amount of necrotic tissue within the wound bed including Adherent Slough. Wound #53 status is Healed - Epithelialized. Original cause of wound was Gradually Appeared. The date acquired was: 05/11/2021. The wound has been in treatment 4 [name]. The wound is located on the Right,Plantar  T Second. The wound measures 0cm length x 0cm width x 0cm  depth; 0cm^2 area and 0cm^3 oe volume. There is no tunneling or undermining noted. There is [name] none present amount of drainage noted. The wound margin is distinct with the outline attached to the wound base. There is no granulation within the wound bed. There is no necrotic tissue within the wound bed. Wound #54 status is Open. Original cause of wound was Pressure Injury. The date acquired was: 05/11/2021. The wound has been in treatment 4 [name]. The wound is located on the Left,Distal Ischium. The wound measures 1.2cm length x 0.4cm width x 0.1cm depth; 0.377cm^2 area and 0.038cm^3 volume. There is Fat Layer (Subcutaneous Tissue) exposed. There is no tunneling or undermining noted. There is [name] medium amount of sanguinous drainage noted. The wound margin is distinct with the outline attached to the wound base. There is large (67-100%) red, friable granulation within the wound bed. There is no necrotic tissue within the wound bed. Wound #55 status is Open. Original cause of wound was Blister. The date acquired was: 05/26/2021. The wound has been in treatment 2 [name]. The wound is located on the Left T Great. The wound measures 0.5cm length x 1.2cm width x 0.2cm depth; 0.471cm^2 area and 0.094cm^3 volume. There is Fat Layer oe (Subcutaneous Tissue) exposed. There is no tunneling or undermining noted. There is [name] medium amount of serosanguineous drainage noted. The wound margin is distinct with the outline attached to the wound base. There is large (67-100%) pink granulation within the wound bed. There is [name] small (1-33%) amount of necrotic tissue within the wound bed including Adherent Slough. 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 There was [name] Selective/Open Wound Skin/Epidermis oe Debridement with [name] total area of 0.6 sq cm performed by [name] [name]., MD. With the following instrument(s): Curette to remove Viable and Non-Viable tissue/material. Material removed includes Eschar, Callus, Skin: Dermis, and Skin: Epidermis after achieving pain control using Lidocaine. No specimens were taken. [name] time out was conducted at 08:28, prior to the start of the procedure. [name] Minimum amount of bleeding was controlled with Pressure. The procedure was tolerated well with [name] pain level of 0 throughout and [name] pain level of 0 following the procedure. Post Debridement Measurements: 0.5cm length x 1.2cm width x 0.2cm depth; 0.094cm^3 volume. Character of Wound/Ulcer Post Debridement is improved. Post procedure Diagnosis Wound #55: Same as Pre-Procedure Plan Follow-up Appointments: Return Appointment in 1 week. - Tuesday with Dr. Dellia Nims Bathing/ Shower/ Hygiene: May shower and wash wound with soap and water. - on days that dressing is changed Edema Control - Lymphedema / SCD / Other: Elevate legs to the level of the heart or above for 30 minutes daily and/or when sitting, [name] frequency of: - throughout the day Compression stocking or Garment 30-40 mm/Hg pressure to: - Juxtalite to both legs daily Off-Loading: Roho cushion for wheelchair Turn and reposition every 2 hours WOUND #41R: - Ischium Wound Laterality: Left Cleanser: Soap and Water Every Other Day/30 Days Discharge Instructions: May shower and wash  wound with dial antibacterial soap and water prior to dressing change. Peri-Wound Care: Triamcinolone 15 (g) Every Other Day/30 Days Discharge Instructions: Use triamcinolone 15 (g) mixed with zinc  oxide Peri-Wound Care: Zinc Oxide Ointment 30g tube Every Other Day/30 Days Discharge Instructions: Apply Zinc Oxide mixed with Triamcinolone to periwound with each dressing change Peri-Wound Care: Triad Hydrophilic Wound Dressing Tube, 6 (oz) Every Other Day/30 Days Discharge Instructions: Apply to periwound with each dressing change Prim Dressing: Hydrofera Blue Ready Foam, 2.5 x2.5 in Every Other Day/30 Days ary Discharge Instructions: Apply to wound bed as instructed Secondary Dressing: ComfortFoam Border, 4x4 in (silicone border) Every Other Day/30 Days Discharge Instructions: Apply over primary dressing as directed. WOUND #51: - Lower Leg Wound Laterality: Left, Anterior Cleanser: Soap and Water Every Other Day/30 Days Discharge Instructions: May shower and wash wound with dial antibacterial soap and water prior to dressing change. Topical: Bacitracin Every Other Day/30 Days Prim Dressing: KerraCel Ag Gelling Fiber Dressing, 2x2 in (silver alginate) Every Other Day/30 Days ary Discharge Instructions: Apply silver alginate to wound bed as instructed Secondary Dressing: ComfortFoam Border, 4x4 in (silicone border) Every Other Day/30 Days Discharge Instructions: Apply over primary dressing as directed. WOUND #52: - Foot Wound Laterality: Dorsal, Right Cleanser: Soap and Water Every Other Day/30 Days Discharge Instructions: May shower and wash wound with dial antibacterial soap and water prior to dressing change. Topical: Bacitracin Every Other Day/30 Days Prim Dressing: KerraCel Ag Gelling Fiber Dressing, 2x2 in (silver alginate) Every Other Day/30 Days ary Discharge Instructions: Apply silver alginate to wound bed as instructed Secondary Dressing: ComfortFoam Border, 4x4 in (silicone border) Every Other Day/30 Days Discharge Instructions: Apply over primary dressing as directed. WOUND #54: - Ischium Wound Laterality: Left, Distal Cleanser: Soap and Water Every Other Day/30  Days Discharge Instructions: May shower and wash wound with dial antibacterial soap and water prior to dressing change. Peri-Wound Care: Triamcinolone 15 (g) Every Other Day/30 Days Discharge Instructions: Use triamcinolone 15 (g) mixed with zinc oxide Peri-Wound Care: Zinc Oxide Ointment 30g tube Every Other Day/30 Days Discharge Instructions: Apply Zinc Oxide mixed with Triamcinolone to periwound with each dressing change Peri-Wound Care: Triad Hydrophilic Wound Dressing Tube, 6 (oz) Every Other Day/30 Days Discharge Instructions: Apply to periwound with each dressing change Prim Dressing: Hydrofera Blue Ready Foam, 2.5 x2.5 in Every Other Day/30 Days ary Discharge Instructions: Apply to wound bed as instructed Secondary Dressing: ComfortFoam Border, 4x4 in (silicone border) Every Other Day/30 Days Discharge Instructions: Apply over primary dressing as directed. WOUND #55: - T Great Wound Laterality: Left oe Cleanser: Soap and Water Every Other Day/30 Days Discharge Instructions: May shower and wash wound with dial antibacterial soap and water prior to dressing change. Topical: Bacitracin Every Other Day/30 Days Prim Dressing: KerraCel Ag Gelling Fiber Dressing, 2x2 in (silver alginate) Every Other Day/30 Days ary Discharge Instructions: Apply silver alginate to wound bed as instructed Secondary Dressing: ComfortFoam Border, 4x4 in (silicone border) Every Other Day/30 Days Discharge Instructions: Apply over primary dressing as directed. 1. I continued with the silver alginate to everything except the left buttock where we are using Hydrofera Blue 2. Continue bacitracin to the areas on the lower leg 3. I have given him samples of Nuzyra 300 mg [name] day for as long as my samples will hold outo 6 7 days. I felt we needed to eradicate the staph in the left anterior mid tibia. As mentioned between his allergies and resistances of the staph very difficult to get  coverage. I have actually thought  about ordering him one of the prolonged IV formulations 4. He has [name] DTI on the left heel this does not look like it is threatening to open but we talked about the significance of this. He told us he uses bunny boots at night 5. I have reviewed both cultures. 1 of which was [name] PCR. The more recent area from the left anterior tibia was [name] usual culture. Both showed tetracycline resistance. The left anterior tibia is also resistant to clindamycin. Trimethoprim sulfamethoxazole may have been an Contractor) Signed: 06/09/2021 5:24:30 PM By: [name] Ham MD Entered By: [name] [name] on 06/09/2021 08:55:23 -------------------------------------------------------------------------------- SuperBill Details Patient Name: Date of Service: [name], [name] [name] 06/09/2021 Medical Record Number: [id number] Patient Account Number: [id number] Date of Birth/Sex: Treating RN: 05-Feb-1986 (35 y.o. [name] [name] Primary Care Provider: [name], [name] Other Clinician: Referring Provider: Treating Provider/Extender: [name] [name] in Treatment: 283 Diagnosis Coding ICD-10 Codes Code Description I87.332 Chronic venous hypertension (idiopathic) with ulcer and inflammation of left lower extremity L97.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, 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, complete L97.821 Non-pressure chronic ulcer of other part of left lower leg limited to breakdown of skin A49.02 Methicillin resistant Staphylococcus aureus infection, unspecified site Facility Procedures CPT4 Code: [id number] Description: [phone] - DEBRIDE WOUND 1ST 20 SQ CM OR < ICD-10 Diagnosis Description I87.332 Chronic venous hypertension (idiopathic) with ulcer and inflammation of left lower Modifier: extremity Quantity: 1 Physician Procedures : CPT4 Code Description Modifier [id number] - WC PHYS DEBR WO ANESTH 20  SQ CM ICD-10 Diagnosis Description I87.332 Chronic venous hypertension (idiopathic) with ulcer and inflammation of left lower extremity Quantity: 1 Electronic Signature(s) Signed: 06/09/2021 5:24:30 PM By: [name] Ham MD Entered By: [name] [name] on 06/09/2021 08:55:55

## 2021-06-09 NOTE — Progress Notes (Signed)
[name] ([id number]) Visit Report for 06/09/2021 Arrival Information Details Patient Name: Date of Service: [name], [name] [name] 06/09/2021 7:30 [name] Medical Record Number: [id number] Patient Account Number: [id number] Date of Birth/Sex: Treating RN: [birth year] (35 y.o. [name] Primary Care Aryani Daffern: [name], [name] Other Clinician: Referring Tayler Lassen: Treating Derrich Gaby/Extender: [name]: 283 Visit Information History Since Last Visit Added or deleted any medications: No Patient Arrived: Wheel Chair Any new allergies or adverse reactions: No Arrival Time: 07:44 Had [name] fall or experienced change in No Accompanied By: alone activities of daily living that may affect Transfer Assistance: None risk of falls: Patient Identification Verified: Yes Signs or symptoms of abuse/neglect since last visito No Secondary Verification Process Completed: Yes Hospitalized since last visit: No Patient Requires Transmission-Based Precautions: No Implantable device outside of the clinic excluding No Patient Has Alerts: Yes cellular tissue based products placed in the center Patient Alerts: R ABI = 1.0 since last visit: L ABI = 1.1 Has Dressing in Place as Prescribed: Yes Has Compression in Place as Prescribed: Yes Pain Present Now: No Electronic Signature(s) Signed: 06/09/2021 6:29:35 PM By: Levan Hurst RN, BSN Entered By: Levan Hurst on 06/09/2021 07:44:47 -------------------------------------------------------------------------------- Encounter Discharge Information Details Patient Name: Date of Service: [name], [name] [name] 06/09/2021 7:30 [name] Medical Record Number: [id number] Patient Account Number: [id number] Date of Birth/Sex: Treating RN: 22-Jun-1986 (35 y.o. [name] Primary Care Mack[place]ee Becvar: [place], [place] Other Clinician: Referring Lihanna Biever: Treating Kemari Mares/Extender: [name]: 283 Encounter  Discharge Information Items Post Procedure Vitals Discharge Condition: Stable Temperature (F): 98.1 Ambulatory Status: Wheelchair Pulse (bpm): 130 Discharge Destination: Home Respiratory Rate (breaths/min): 16 Transportation: Private Auto Blood Pressure (mmHg): 125/79 Schedule Follow-up Appointment: Yes Clinical Summary of Care: Provided on 06/09/2021 Form Type Recipient Paper Patient Patient Electronic Signature(s) Signed: 06/09/2021 8:43:32 AM By: Lorrin Jackson Previous Signature: 06/09/2021 8:18:46 AM Version By: Lorrin Jackson Entered By: Lorrin Jackson on 06/09/2021 08:43:32 -------------------------------------------------------------------------------- Lower Extremity Assessment Details Patient Name: Date of Service: [name], [name] Anawalt 06/09/2021 7:30 [name] Medical Record Number: [id number] Patient Account Number: [id number] Date of Birth/Sex: Treating RN: [birth year] (35 y.o. [name] Primary Care Rudie Sermons: Hart, Hansell Other Clinician: Referring Cheyeanne Roadcap: Treating Jeniffer Culliver/Extender: [name]: 283 Edema Assessment Assessed: [Left: No] [Right: No] Edema: [Left: Yes] [Right: Yes] Calf Left: Right: Point of Measurement: 33 cm From Medial Instep 26 cm 33 cm Ankle Left: Right: Point of Measurement: 10 cm From Medial Instep 23 cm 23.5 cm Vascular Assessment Pulses: Dorsalis Pedis Palpable: [Left:Yes] [Right:Yes] Electronic Signature(s) Signed: 06/09/2021 6:29:35 PM By: Levan Hurst RN, BSN Entered By: Levan Hurst on 06/09/2021 07:58:40 -------------------------------------------------------------------------------- Multi Wound Chart Details Patient Name: Date of Service: [name], [name] [name] 06/09/2021 7:30 [name] Medical Record Number: [id number] Patient Account Number: [id number] Date of Birth/Sex: Treating RN: 07/07/1986 (35 y.o. [name] Primary Care Ailana Cuadrado: [name], [name] Other Clinician: Referring  Xitlally Mooneyham: Treating Keyleen Cerrato/Extender: [name]: 283 Vital Signs Height(in): 70 Pulse(bpm): 130 Weight(lbs): 216 Blood Pressure(mmHg): 125/79 Body Mass Index(BMI): 31 Temperature(F): 98.1 Respiratory Rate(breaths/min): 16 Photos: [41R:No Photos Left Ischium] [51:No Photos Left, Anterior Lower Leg] [52:No Photos Right, Dorsal Foot] Wound Location: [41R:Gradually Appeared] [51:Trauma] [52:Gradually Appeared] Wounding Event: [41R:Pressure Ulcer] [51:Trauma, Other] [52:Trauma, Other] Primary Etiology: [41R:Sleep Apnea, Hypertension, Paraplegia Sleep Apnea, Hypertension, Paraplegia Sleep Apnea, Hypertension, Paraplegia] Comorbid History: [41R:03/16/2020] [51:01/26/2021] [52:03/27/2021] Date Acquired: [41R:64] [51:16] [52:10] Weeks of Treatment: [  41R:Open] [51:Open] [52:Open] Wound Status: [41R:Yes] [51:No] [52:No] Wound Recurrence: [41R:Yes] [51:No] [52:No] Clustered Wound: [41R:4] [51:[name]] [52:[name]] Clustered Quantity: [41R:0.4x0.3x0.1] [51:1.5x0.9x0.1] [52:0.5x3x0.1] Measurements L x W x D (cm) [41R:0.094] [51:1.06] [52:1.178] [name] (cm) : rea [41R:0.009] [51:0.106] [52:0.118] Volume (cm) : [41R:94.80%] [51:-221.20%] [52:37.50%] % Reduction in Area: [41R:95.00%] [51:-221.20%] [52:37.20%] % Reduction in Volume: [41R:Category/Stage II] [51:Full Thickness Without Exposed] [52:Full Thickness Without Exposed] Classification: [41R:Medium] [51:Support Structures Medium] [52:Support Structures Medium] Exudate [name] mount: [41R:Sanguinous] [51:Serosanguineous] [52:Serosanguineous] Exudate Type: [41R:red] [51:red, brown] [52:red, brown] Exudate Color: [41R:Distinct, outline attached] [51:Distinct, outline attached] [52:Distinct, outline attached] Wound Margin: [41R:Large (67-100%)] [51:Large (67-100%)] [52:Small (1-33%)] Granulation [name] mount: [41R:Red, Pink, Friable] [51:Red, Hyper-granulation, Friable] [52:Pink] Granulation Quality: [41R:None Present (0%)] [51:None  Present (0%)] [52:Large (67-100%)] Necrotic [name] mount: [41R:Fat Layer (Subcutaneous Tissue): Yes Fat Layer (Subcutaneous Tissue): Yes Fat Layer (Subcutaneous Tissue): Yes] Exposed Structures: [41R:Fascia: No Tendon: No Muscle: No Joint: No Bone: No Medium (34-66%)] [51:Fascia: No Tendon: No Muscle: No Joint: No Bone: No Small (1-33%)] [52:Fascia: No Tendon: No Muscle: No Joint: No Bone: No None] Epithelialization: [41R:[name]] [51:[name]] [52:[name]] Debridement: [41R:[name]] [51:[name]] [52:[name]] Pain Control: [41R:[name]] [51:[name]] [52:[name]] Tissue Debrided: [41R:[name]] [51:[name]] [52:[name]] Level: [41R:[name]] [51:[name]] [52:[name]] Debridement [name] (sq cm): [41R:rea [name]] [51:[name]] [52:[name]] Instrument: [41R:[name]] [51:[name]] [52:[name]] Bleeding: [41R:[name]] [51:[name]] [52:[name]] Hemostasis [name] chieved: [41R:[name]] [51:[name]] [52:[name]] Procedural Pain: [41R:[name]] [51:[name]] [52:[name]] Post Procedural Pain: Debridement Treatment Response: [name] [51:[name]] [52:[name]] Post Debridement Measurements L x [name] [51:[name]] [52:[name]] W x D (cm) [41R:[name]] [51:[name]] [52:[name]] Post Debridement Volume: (cm) [41R:[name]] [51:[name]] [52:[name]] Wound Number: [id number] Photos: No Photos No Photos No Photos Right, Plantar T Second oe Left, Distal Ischium Left T Great oe Wound Location: Gradually Appeared Pressure Injury Blister Wounding 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 Primary Etiology: Sleep Apnea, Hypertension, Paraplegia Sleep Apnea, Hypertension, Paraplegia Sleep Apnea, Hypertension, Paraplegia Comorbid History: 05/11/2021 05/11/2021 05/26/2021 Date Acquired: 4 4 2  Weeks of Treatment: Healed - Epithelialized Open Open Wound Status: No No No Wound Recurrence: No No No Clustered Wound: [name] [name] [name] Clustered Quantity: 0x0x0 1.2x0.4x0.1 0.5x1.2x0.2 Measurements L x W x D (cm) 0 0.377 0.471 [name] (cm) : rea 0 0.038 0.094 Volume (cm) : 100.00% 73.30% 40.00% % Reduction in [name] rea: 100.00% 73.00% -19.00% % Reduction in Volume: Full Thickness  Without Exposed Category/Stage III Full Thickness Without Exposed Classification: Support Structures Support Structures None Present Medium Medium Exudate [name] mount: [name] Sanguinous Serosanguineous Exudate Type: [name] red red, brown Exudate Color: Distinct, outline attached Distinct, outline attached Distinct, outline attached Wound Margin: None Present (0%) Large (67-100%) Large (67-100%) Granulation [name] mount: [name] Red, Friable Pink Granulation Quality: None Present (0%) None Present (0%) Small (1-33%) Necrotic [name] mount: Fascia: No Fat Layer (Subcutaneous Tissue): Yes Fat Layer (Subcutaneous Tissue): Yes Exposed Structures: Fat Layer (Subcutaneous Tissue): No Fascia: No Fascia: No Tendon: No Tendon: No Tendon: No Muscle: No Muscle: No Muscle: No Joint: No Joint: No Joint: No Bone: No Bone: No Bone: No Large (67-100%) Medium (34-66%) None Epithelialization: [name] [name] Debridement - Excisional Debridement: Pre-procedure Verification/Time Out [name] [name] 08:28 Taken: [name] [name] Lidocaine Pain Control: [name] [name] Callus, Subcutaneous Tissue Debrided: [name] [name] Skin/Subcutaneous Tissue Level: [name] [name] 0.6 Debridement [name] (sq cm): rea [name] [name] Curette Instrument: [name] [name] Minimum Bleeding: [name] [name] Pressure Hemostasis [name] chieved: [name] [name] 0 Procedural Pain: [name] [name] 0 Post Procedural Pain: [name] [name] Procedure was tolerated well Debridement Treatment Response: [name] [name] 0.5x1.2x0.2 Post Debridement Measurements L x W x D (cm) [name] [name] 0.094  Post Debridement Volume: (cm) [name] [name] Debridement Procedures Performed: Treatment Notes Wound #41R (Ischium) Wound Laterality: Left Cleanser Soap and Water Discharge Instruction: May shower and wash wound with dial antibacterial soap and water prior to dressing change. Peri-Wound Care Triamcinolone 15 (g) Discharge Instruction: Use triamcinolone 15 (g) mixed with zinc oxide Zinc Oxide Ointment 30g tube Discharge Instruction: Apply Zinc Oxide  mixed with Triamcinolone to periwound with each dressing change Triad Hydrophilic Wound Dressing Tube, 6 (oz) Discharge Instruction: Apply to periwound with each dressing change Topical Primary Dressing Hydrofera Blue Ready Foam, 2.5 x2.5 in Discharge Instruction: Apply to wound bed as instructed Secondary Dressing ComfortFoam Border, 4x4 in (silicone border) Discharge Instruction: Apply over primary dressing as directed. Secured With Compression Wrap Compression Stockings Add-Ons Wound #51 (Lower Leg) Wound Laterality: Left, Anterior Cleanser Soap and Water Discharge Instruction: May shower and wash wound with dial antibacterial soap and water prior to dressing change. Peri-Wound Care Topical Bacitracin Primary Dressing KerraCel Ag Gelling Fiber Dressing, 2x2 in (silver alginate) Discharge Instruction: Apply silver alginate to wound bed as instructed Secondary Dressing ComfortFoam Border, 4x4 in (silicone border) Discharge Instruction: Apply over primary dressing as directed. Secured With Compression Wrap Compression Stockings Add-Ons Wound #52 (Foot) Wound Laterality: Dorsal, Right Cleanser Soap and Water Discharge Instruction: May shower and wash wound with dial antibacterial soap and water prior to dressing change. Peri-Wound Care Topical Bacitracin Primary Dressing KerraCel Ag Gelling Fiber Dressing, 2x2 in (silver alginate) Discharge Instruction: Apply silver alginate to wound bed as instructed Secondary Dressing ComfortFoam Border, 4x4 in (silicone border) Discharge Instruction: Apply over primary dressing as directed. Secured With Compression Wrap Compression Stockings Add-Ons Wound #54 (Ischium) Wound Laterality: Left, Distal Cleanser Soap and Water Discharge Instruction: May shower and wash wound with dial antibacterial soap and water prior to dressing change. Peri-Wound Care Triamcinolone 15 (g) Discharge Instruction: Use triamcinolone 15 (g) mixed  with zinc oxide Zinc Oxide Ointment 30g tube Discharge Instruction: Apply Zinc Oxide mixed with Triamcinolone to periwound with each dressing change Triad Hydrophilic Wound Dressing Tube, 6 (oz) Discharge Instruction: Apply to periwound with each dressing change Topical Primary Dressing Hydrofera Blue Ready Foam, 2.5 x2.5 in Discharge Instruction: Apply to wound bed as instructed Secondary Dressing ComfortFoam Border, 4x4 in (silicone border) Discharge Instruction: Apply over primary dressing as directed. Secured With Compression Wrap Compression Stockings Add-Ons Wound #55 (Toe Great) Wound Laterality: Left Cleanser Soap and Water Discharge Instruction: May shower and wash wound with dial antibacterial soap and water prior to dressing change. Peri-Wound Care Topical Bacitracin Primary Dressing KerraCel Ag Gelling Fiber Dressing, 2x2 in (silver alginate) Discharge Instruction: Apply silver alginate to wound bed as instructed Secondary Dressing ComfortFoam Border, 4x4 in (silicone border) Discharge Instruction: Apply over primary dressing as directed. Secured With Compression Wrap Compression Stockings Environmental education officer) Signed: 06/09/2021 5:24:30 PM By: Linton Ham MD Signed: 06/09/2021 5:32:12 PM By: Rhae Hammock RN Entered By: Linton Ham on 06/09/2021 08:44:35 -------------------------------------------------------------------------------- Multi-Disciplinary Care Plan Details Patient Name: Date of Service: [name], [name] [name] 06/09/2021 7:30 [name] Medical Record Number: [id number] Patient Account Number: [id number] Date of Birth/Sex: Treating RN: 1985-12-11 (35 y.o. [name] Primary Care Ulyses Panico: [name], [name] Other Clinician: Referring Avyn Coate: Treating Joell Buerger/Extender: [name]: [place] reviewed with physician Active Inactive Wound/Skin Impairment Nursing  Diagnoses: Impaired tissue integrity Knowledge deficit related to ulceration/compromised skin integrity Goals: Patient/caregiver will verbalize understanding of skin care regimen Date Initiated: 01/05/2016 Target Resolution Date: 06/18/2021 Goal  Status: Active Ulcer/skin breakdown will have [name] volume reduction of 30% by week 4 Date Initiated: 01/05/2016 Date Inactivated: 12/22/2017 Target Resolution Date: 01/19/2018 Unmet Reason: complex wounds, Goal Status: Unmet infection Interventions: Assess patient/caregiver ability to obtain necessary supplies Assess ulceration(s) every visit Provide education on ulcer and skin care Notes: 02/02/21: Complex Care, ongoing. Electronic Signature(s) Signed: 06/09/2021 5:32:12 PM By: Rhae Hammock RN Entered By: Rhae Hammock on 06/09/2021 08:02:42 -------------------------------------------------------------------------------- Pain Assessment Details Patient Name: Date of Service: [name], [name] [name] 06/09/2021 7:30 [name] Medical Record Number: [id number] Patient Account Number: [id number] Date of Birth/Sex: Treating RN: May 28, 1986 (35 y.o. [name] Primary Care Kya Mayfield: [place], [place] Other Clinician: Referring Mylo Driskill: Treating Perrie Ragin/Extender: [name]: 283 Active Problems Location of Pain Severity and Description of Pain Patient Has Paino No Site Locations Pain Management and Medication Current Pain Management: Electronic Signature(s) Signed: 06/09/2021 6:29:35 PM By: Levan Hurst RN, BSN Entered By: Levan Hurst on 06/09/2021 07:44:59 -------------------------------------------------------------------------------- Patient/Caregiver Education Details Patient Name: Date of Service: Lorenz, [name] [name] 7/19/2022andnbsp7:30 [name] Medical Record Number: [id number] Patient Account Number: [id number] Date of Birth/Gender: Treating RN: 04-13-1986 (35 y.o. [name] Primary Care  Physician: Janine Limbo Other Clinician: Referring Physician: Treating Physician/Extender: [name]: 283 Education Assessment Education Provided To: Patient Education Topics Provided Wound/Skin Impairment: Methods: Explain/Verbal Responses: State content correctly Electronic Signature(s) Signed: 06/09/2021 5:32:12 PM By: Rhae Hammock RN Entered By: Rhae Hammock on 06/09/2021 08:03:07 -------------------------------------------------------------------------------- Wound Assessment Details Patient Name: Date of Service: [name], [name] [name] 06/09/2021 7:30 [name] Medical Record Number: [id number] Patient Account Number: [id number] Date of Birth/Sex: Treating RN: 08-12-1986 (35 y.o. [name] Primary Care Tiphanie Vo: [name], [name] Other Clinician: Referring Dallen Bunte: Treating Anatalia Kronk/Extender: [name]: 283 Wound Status Wound Number: [id number] Primary Etiology: Pressure Ulcer Wound Location: Left Ischium Wound Status: Open Wounding Event: Gradually Appeared Comorbid History: Sleep Apnea, Hypertension, Paraplegia Date Acquired: 03/16/2020 Weeks Of Treatment: 64 Clustered Wound: Yes Photos Photo Uploaded By: Sandre Kitty on 06/09/2021 17:05:05 Wound Measurements Length: (cm) 0.4 Width: (cm) 0.3 Depth: (cm) 0.1 Clustered Quantity: 4 Area: (cm) 0.094 Volume: (cm) 0.009 % Reduction in Area: 94.8% % Reduction in Volume: 95% Epithelialization: Medium (34-66%) Tunneling: No Undermining: No Wound Description Classification: Category/Stage II Wound Margin: Distinct, outline attached Exudate Amount: Medium Exudate Type: Sanguinous Exudate Color: red Foul Odor After Cleansing: No Slough/Fibrino No Wound Bed Granulation Amount: Large (67-100%) Exposed Structure Granulation Quality: Red, Pink, Friable Fascia Exposed: No Necrotic Amount: None Present (0%) Fat Layer (Subcutaneous  Tissue) Exposed: Yes Tendon Exposed: No Muscle Exposed: No Joint Exposed: No Bone Exposed: No Treatment Notes Wound #41R (Ischium) Wound Laterality: Left Cleanser Soap and Water Discharge Instruction: May shower and wash wound with dial antibacterial soap and water prior to dressing change. Peri-Wound Care Triamcinolone 15 (g) Discharge Instruction: Use triamcinolone 15 (g) mixed with zinc oxide Zinc Oxide Ointment 30g tube Discharge Instruction: Apply Zinc Oxide mixed with Triamcinolone to periwound with each dressing change Triad Hydrophilic Wound Dressing Tube, 6 (oz) Discharge Instruction: Apply to periwound with each dressing change Topical Primary Dressing Hydrofera Blue Ready Foam, 2.5 x2.5 in Discharge Instruction: Apply to wound bed as instructed Secondary Dressing ComfortFoam Border, 4x4 in (silicone border) Discharge Instruction: Apply over primary dressing as directed. Secured With Compression Wrap Compression Stockings Environmental education officer) Signed: 06/09/2021 6:29:35 PM By: Levan Hurst RN, BSN Entered By: Levan Hurst on 06/09/2021 08:01:03 --------------------------------------------------------------------------------  Wound Assessment Details Patient Name: Date of Service: [name], [name] [name] 06/09/2021 7:30 [name] Medical Record Number: [id number] Patient Account Number: [id number] Date of Birth/Sex: Treating RN: 08-21-1986 (35 y.o. [name] Primary Care Chrisopher Pustejovsky: [name], [name] Other Clinician: Referring Monterrius Cardosa: Treating Abdulhamid Olgin/Extender: [name]: 283 Wound Status Wound Number: [id number] Primary Etiology: Trauma, Other Wound Location: Left, Anterior Lower Leg Wound Status: Open Wounding Event: Trauma Comorbid History: Sleep Apnea, Hypertension, Paraplegia Date Acquired: 01/26/2021 Weeks Of Treatment: 16 Clustered Wound: No Photos Photo Uploaded By: Sandre Kitty on 06/09/2021 17:05:05 Wound  Measurements Length: (cm) 1.5 Width: (cm) 0.9 Depth: (cm) 0.1 Area: (cm) 1.06 Volume: (cm) 0.106 % Reduction in Area: -221.2% % Reduction in Volume: -221.2% Epithelialization: Small (1-33%) Tunneling: No Undermining: No Wound Description Classification: Full Thickness Without Exposed Support Structures Wound Margin: Distinct, outline attached Exudate Amount: Medium Exudate Type: Serosanguineous Exudate Color: red, brown Foul Odor After Cleansing: No Slough/Fibrino No Wound Bed Granulation Amount: Large (67-100%) Exposed Structure Granulation Quality: Red, Hyper-granulation, Friable Fascia Exposed: No Necrotic Amount: None Present (0%) Fat Layer (Subcutaneous Tissue) Exposed: Yes Tendon Exposed: No Muscle Exposed: No Joint Exposed: No Bone Exposed: No Treatment Notes Wound #51 (Lower Leg) Wound Laterality: Left, Anterior Cleanser Soap and Water Discharge Instruction: May shower and wash wound with dial antibacterial soap and water prior to dressing change. Peri-Wound Care Topical Bacitracin Primary Dressing KerraCel Ag Gelling Fiber Dressing, 2x2 in (silver alginate) Discharge Instruction: Apply silver alginate to wound bed as instructed Secondary Dressing ComfortFoam Border, 4x4 in (silicone border) Discharge Instruction: Apply over primary dressing as directed. Secured With Compression Wrap Compression Stockings Environmental education officer) Signed: 06/09/2021 6:29:35 PM By: Levan Hurst RN, BSN Entered By: Levan Hurst on 06/09/2021 08:01:32 -------------------------------------------------------------------------------- Wound Assessment Details Patient Name: Date of Service: [name], [name] [name] 06/09/2021 7:30 [name] Medical Record Number: [id number] Patient Account Number: [id number] Date of Birth/Sex: Treating RN: [birth year] (35 y.o. [name] Primary Care Leane Loring: [name], [name] Other Clinician: Referring Sohaib Vereen: Treating Kaleem Sartwell/Extender:  [name]: 283 Wound Status Wound Number: [id number] Primary Etiology: Trauma, Other Wound Location: Right, Dorsal Foot Wound Status: Open Wounding Event: Gradually Appeared Comorbid History: Sleep Apnea, Hypertension, Paraplegia Date Acquired: 03/27/2021 Weeks Of Treatment: 10 Clustered Wound: No Photos Photo Uploaded By: Sandre Kitty on 06/09/2021 17:02:53 Wound Measurements Length: (cm) 0.5 Width: (cm) 3 Depth: (cm) 0.1 Area: (cm) 1.178 Volume: (cm) 0.118 % Reduction in Area: 37.5% % Reduction in Volume: 37.2% Epithelialization: None Tunneling: No Undermining: No Wound Description Classification: Full Thickness Without Exposed Support Structures Wound Margin: Distinct, outline attached Exudate Amount: Medium Exudate Type: Serosanguineous Exudate Color: red, brown Foul Odor After Cleansing: No Slough/Fibrino Yes Wound Bed Granulation Amount: Small (1-33%) Exposed Structure Granulation Quality: Pink Fascia Exposed: No Necrotic Amount: Large (67-100%) Fat Layer (Subcutaneous Tissue) Exposed: Yes Necrotic Quality: Adherent Slough Tendon Exposed: No Muscle Exposed: No Joint Exposed: No Bone Exposed: No Treatment Notes Wound #52 (Foot) Wound Laterality: Dorsal, Right Cleanser Soap and Water Discharge Instruction: May shower and wash wound with dial antibacterial soap and water prior to dressing change. Peri-Wound Care Topical Bacitracin Primary Dressing KerraCel Ag Gelling Fiber Dressing, 2x2 in (silver alginate) Discharge Instruction: Apply silver alginate to wound bed as instructed Secondary Dressing ComfortFoam Border, 4x4 in (silicone border) Discharge Instruction: Apply over primary dressing as directed. Secured With Compression Wrap Compression Stockings Environmental education officer) Signed: 06/09/2021 6:29:35 PM By: Levan Hurst RN, BSN Entered By: Donnal Debar,  Shatara on 06/09/2021  08:02:04 -------------------------------------------------------------------------------- Wound Assessment Details Patient Name: Date of Service: [name], [name] [name] 06/09/2021 7:30 [name] Medical Record Number: [id number] Patient Account Number: [id number] Date of Birth/Sex: Treating RN: 19-Oct-1986 (35 y.o. [name] Primary Care Loretto Belinsky: [name], [name] Other Clinician: Referring Raylan Troiani: Treating Shanon Seawright/Extender: [name]: 283 Wound Status Wound Number: 53 Primary Etiology: Lymphedema Wound Location: Right, Plantar T Second oe Wound Status: Healed - Epithelialized Wounding Event: Gradually Appeared Comorbid History: Sleep Apnea, Hypertension, Paraplegia Date Acquired: 05/11/2021 Weeks Of Treatment: 4 Clustered Wound: No Photos Photo Uploaded By: Sandre Kitty on 06/09/2021 17:02:54 Wound Measurements Length: (cm) Width: (cm) Depth: (cm) Area: (cm) Volume: (cm) 0 % Reduction in Area: 100% 0 % Reduction in Volume: 100% 0 Epithelialization: Large (67-100%) 0 Tunneling: No 0 Undermining: No Wound Description Classification: Full Thickness Without Exposed Support Structures Wound Margin: Distinct, outline attached Exudate Amount: None Present Foul Odor After Cleansing: No Slough/Fibrino No Wound Bed Granulation Amount: None Present (0%) Exposed Structure Necrotic Amount: None Present (0%) Fascia Exposed: No Fat Layer (Subcutaneous Tissue) Exposed: No Tendon Exposed: No Muscle Exposed: No Joint Exposed: No Bone Exposed: No Electronic Signature(s) Signed: 06/09/2021 6:29:35 PM By: Levan Hurst RN, BSN Entered By: Levan Hurst on 06/09/2021 08:02:32 -------------------------------------------------------------------------------- Wound Assessment Details Patient Name: Date of Service: [name], [name] [name] 06/09/2021 7:30 [name] Medical Record Number: [id number] Patient Account Number: [id number] Date of Birth/Sex: Treating  RN: [birth year] (35 y.o. [name] Primary Care Daryan Cagley: [name], [name] Other Clinician: Referring Lezli Danek: Treating Leotha Westermeyer/Extender: [name]: 283 Wound Status Wound Number: [id number] Primary Etiology: Pressure Ulcer Wound Location: Left, Distal Ischium Wound Status: Open Wounding Event: Pressure Injury Comorbid History: Sleep Apnea, Hypertension, Paraplegia Date Acquired: 05/11/2021 Weeks Of Treatment: 4 Clustered Wound: No Photos Photo Uploaded By: Sandre Kitty on 06/09/2021 17:04:10 Wound Measurements Length: (cm) 1.2 Width: (cm) 0.4 Depth: (cm) 0.1 Area: (cm) 0.377 Volume: (cm) 0.038 % Reduction in Area: 73.3% % Reduction in Volume: 73% Epithelialization: Medium (34-66%) Tunneling: No Undermining: No Wound Description Classification: Category/Stage III Wound Margin: Distinct, outline attached Exudate Amount: Medium Exudate Type: Sanguinous Exudate Color: red Foul Odor After Cleansing: No Slough/Fibrino No Wound Bed Granulation Amount: Large (67-100%) Exposed Structure Granulation Quality: Red, Friable Fascia Exposed: No Necrotic Amount: None Present (0%) Fat Layer (Subcutaneous Tissue) Exposed: Yes Tendon Exposed: No Muscle Exposed: No Joint Exposed: No Bone Exposed: No Treatment Notes Wound #54 (Ischium) Wound Laterality: Left, Distal Cleanser Soap and Water Discharge Instruction: May shower and wash wound with dial antibacterial soap and water prior to dressing change. Peri-Wound Care Triamcinolone 15 (g) Discharge Instruction: Use triamcinolone 15 (g) mixed with zinc oxide Zinc Oxide Ointment 30g tube Discharge Instruction: Apply Zinc Oxide mixed with Triamcinolone to periwound with each dressing change Triad Hydrophilic Wound Dressing Tube, 6 (oz) Discharge Instruction: Apply to periwound with each dressing change Topical Primary Dressing Hydrofera Blue Ready Foam, 2.5 x2.5 in Discharge  Instruction: Apply to wound bed as instructed Secondary Dressing ComfortFoam Border, 4x4 in (silicone border) Discharge Instruction: Apply over primary dressing as directed. Secured With Compression Wrap Compression Stockings Environmental education officer) Signed: 06/09/2021 6:29:35 PM By: Levan Hurst RN, BSN Entered By: Levan Hurst on 06/09/2021 08:03:00 -------------------------------------------------------------------------------- Wound Assessment Details Patient Name: Date of Service: [name], [name] [name] 06/09/2021 7:30 [name] Medical Record Number: [id number] Patient Account Number: [id number] Date of Birth/Sex: Treating RN: 1986/09/23 (35 y.o. [name] Primary Care Godwin Tedesco:  [name], [name] Other Clinician: Referring Jaziel Bennett: Treating Constantin Hillery/Extender: Dutch Gray, [name]: 283 Wound Status Wound Number: 55 Primary Etiology: Neuropathic Ulcer-Non Diabetic Wound Location: Left T Great oe Wound Status: Open Wounding Event: Blister Comorbid History: Sleep Apnea, Hypertension, Paraplegia Date Acquired: 05/26/2021 Weeks Of Treatment: 2 Clustered Wound: No Photos Photo Uploaded By: Sandre Kitty on 06/09/2021 17:04:11 Wound Measurements Length: (cm) 0.5 Width: (cm) 1.2 Depth: (cm) 0.2 Area: (cm) 0.471 Volume: (cm) 0.094 % Reduction in Area: 40% % Reduction in Volume: -19% Epithelialization: None Tunneling: No Undermining: No Wound Description Classification: Full Thickness Without Exposed Support Structures Wound Margin: Distinct, outline attached Exudate Amount: Medium Exudate Type: Serosanguineous Exudate Color: red, brown Foul Odor After Cleansing: No Slough/Fibrino No Wound Bed Granulation Amount: Large (67-100%) Exposed Structure Granulation Quality: Pink Fascia Exposed: No Necrotic Amount: Small (1-33%) Fat Layer (Subcutaneous Tissue) Exposed: Yes Necrotic Quality: Adherent Slough Tendon Exposed: No Muscle  Exposed: No Joint Exposed: No Bone Exposed: No Treatment Notes Wound #55 (Toe Great) Wound Laterality: Left Cleanser Soap and Water Discharge Instruction: May shower and wash wound with dial antibacterial soap and water prior to dressing change. Peri-Wound Care Topical Bacitracin Primary Dressing KerraCel Ag Gelling Fiber Dressing, 2x2 in (silver alginate) Discharge Instruction: Apply silver alginate to wound bed as instructed Secondary Dressing ComfortFoam Border, 4x4 in (silicone border) Discharge Instruction: Apply over primary dressing as directed. Secured With Compression Wrap Compression Stockings Environmental education officer) Signed: 06/09/2021 6:29:35 PM By: Levan Hurst RN, BSN Entered By: Levan Hurst on 06/09/2021 08:03:36 -------------------------------------------------------------------------------- [place] Details Patient Name: Date of Service: Skorupski, [name] [name] 06/09/2021 7:30 [name] Medical Record Number: [id number] Patient Account Number: [id number] Date of Birth/Sex: Treating RN: Apr 11, 1986 (35 y.o. [name] Primary Care Anitra Doxtater: [place], [place] Other Clinician: Referring Adelaido Nicklaus: Treating Braden Cimo/Extender: [name]: 283 Vital Signs Time Taken: 07:44 Temperature (F): 98.1 Height (in): 70 Pulse (bpm): 130 Weight (lbs): 216 Respiratory Rate (breaths/min): 16 Body Mass Index (BMI): 31 Blood Pressure (mmHg): 125/79 Reference Range: 80 - 120 mg / dl Electronic Signature(s) Signed: 06/09/2021 6:29:35 PM By: Levan Hurst RN, BSN Entered By: Levan Hurst on 06/09/2021 07:47:30

## 2021-06-15 ENCOUNTER — Other Ambulatory Visit: Payer: Self-pay

## 2021-06-15 ENCOUNTER — Encounter (HOSPITAL_BASED_OUTPATIENT_CLINIC_OR_DEPARTMENT_OTHER): Payer: 59 | Admitting: Internal Medicine

## 2021-06-15 DIAGNOSIS — L89323 Pressure ulcer of left buttock, stage 3: Secondary | ICD-10-CM | POA: Diagnosis not present

## 2021-06-15 NOTE — Progress Notes (Signed)
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 refractory to multiple dressing attempts. He also has areas in his first and second toes bilaterally in the anterior and posterior web space. I had been using Iodoflex here after a prolonged course of silver alginate with ketoconazole was ineffective [question tinea pedis] 07/14/17; patient arrives today with a very difficult adherent material over his left lateral lower leg wound. He also has surrounding erythema and poorly controlled edema. He was switched his Santyl last visit which the nurses are applying once during his doctor visit and once on a nurse visit. He was also reduced to 2 layer compression I'm not exactly sure of the issue here. 07/21/17; better surface today after 1 week of Iodoflex. Significant cellulitis that we treated last week also better. [Doxycycline] 07/28/17 better surface today with now 2 [name] of Iodoflex. Significant cellulitis treated with doxycycline. He has now completed the doxycycline  and he is back to his usual degree of chronic venous inflammation/stasis dermatitis. He reminds me he has had ablations surgery here 08/04/17; continued improvement with Iodoflex to the left lateral leg wound in terms of the surface of the  wound although the dimensions are better. 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 Problematic areas on the plantar aspect of the first second toe web space on the left and the dorsal aspect of the first second toe web space on the right. At one point I felt these were probably related to chronic fungal infections in treated him aggressively for this although we have not made any improvement here. 08/11/17; left lateral leg. Surface continues to improve with the Iodoflex although we are not seeing much improvement in overall wound dimensions. Areas on his plantar left foot and right foot show no improvement. In fact the right foot looks somewhat worse 08/18/17; left lateral leg. We changed to [facility] Blue last week after a prolonged course of Iodoflex which helps get the surface better. It appears that the wound with is improved. Continue with difficult areas on the left dorsal first second and plantar first second on the right 09/01/17; patient arrives in clinic today having had a temperature of 103 yesterday. He was seen in the ER and [facility]. The patient was concerned he could have cellulitis again in the right leg however they diagnosed him with a UTI and he is now on Keflex. He has a history of cellulitis which is been recurrent and difficult but this is been in the left leg, in the past 5 use doxycycline. He does in and out catheterizations at home which are risk factors for UTI 09/08/17; patient will be completing his Keflex this weekend. The erythema on the left leg is considerably better. He has a new wound today on the medial part of the right leg small superficial almost looks like a skin tear. He has worsening of the area on the right dorsal first and second toe. His major area on the left lateral leg is better. Using Hydrofera Blue on all areas 09/15/17; gradual reduction in width on the long wound in the left  lateral leg. No debridement required. He also has wounds on the plantar aspect of his left first second toe web space and on the dorsal aspect of the right first second toe web space. 09/22/17; there continues to be very gradual improvements in the dimensions of the left lateral leg wound. He hasn't round erythematous spot with might be pressure on his wheelchair. There is no evidence obviously of infection no purulence no warmth He has a dry scaled area on the plantar aspect of the left first second toe Improved area on the dorsal right first second toe. 09/29/17; left lateral leg wound continues to improve in dimensions mostly with an is still a fairly long but increasingly narrow wound. He has a dry scaled area on the plantar aspect of his left first second toe web space Increasingly concerning area on the dorsal right first second toe. In fact I am concerned today about possible cellulitis around this wound. The areas extending up his second toe and although there is deformities here almost appears to abut on the nailbed. 10/06/17; left lateral leg wound continues to make very gradual progress. Tissue culture I did from the right first second toe dorsal foot last time grew MRSA and enterococcus which was vancomycin sensitive. This was not sensitive to  clindamycin or doxycycline. He is allergic to Zyvox and sulfa we have therefore arrange for him to have dalvance infusion tomorrow. He is had this in the past and tolerated it well 10/20/17; left lateral leg wound continues to make decent progress. This is certainly reduced in terms of with there is advancing epithelialization.The cellulitis in the right foot looks better although he still has a deep wound in the dorsal aspect of the first second toe web space. Plantar left first toe web space on the left I think is making some progress 10/27/17; left lateral leg wound continues to make decent progress. Advancing epithelialization.using Hydrofera  Blue The right first second toe web space wound is better-looking using silver alginate Improvement in the left plantar first second toe web space. Again using silver alginate 11/03/17 left lateral leg wound continues to make decent progress albeit slowly. Using [facility] The right per second toe web space continues to be a very problematic looking punched out wound. I obtained a piece of tissue for deep culture I did extensively treated this for fungus. It is difficult to imagine that this is a pressure area as the patient states other than going outside he doesn't really wear shoes at home The left plantar first second toe web space looked fairly senescent. Necrotic edges. This required debridement change to [facility] Blue to all wound areas 11/10/17; left lateral leg wound continues to contract. Using Hydrofera Blue On the right dorsal first second toe web space dorsally. Culture I did of this area last week grew MRSA there is not an easy oral option in this patient was multiple antibiotic allergies or intolerances. This was only a rare culture isolate I'm therefore going to use Bactroban under silver alginate On the left plantar first second toe web space. Debridement is required here. This is also unchanged 11/17/17; left lateral leg wound continues to contract using Hydrofera Blue this is no longer the major issue. The major concern here is the right first second toe web space. He now has an open area going from dorsally to the plantar aspect. There is now wound on the inner lateral part of the first toe. Not a very viable surface on this. There is erythema spreading medially into the forefoot. No major change in the left first second toe plantar wound 11/24/17; left lateral leg wound continues to contract using Hydrofera Blue. Nice improvement today The right first second toe web space all of this looks a lot less angry than last week. I have given him clindamycin and topical Bactroban  for MRSA and terbinafine for the possibility of underlining tinea pedis that I could not control with ketoconazole. Looks somewhat better The area on the plantar left first second toe web space is weeping with dried debris around the wound 12/01/17; left lateral leg wound continues to contract he Hydrofera Blue. It is becoming thinner in terms of with nevertheless it is making good improvement. The right first second toe web space looks less angry but still a large necrotic-looking wounds starting on the plantar aspect of the right foot extending between the toes and now extensively on the base of the right second toe. I gave him clindamycin and topical Bactroban for MRSA anterior benefiting for the possibility of underlying tinea pedis. Not looking better today The area on the left first/second toe looks better. Debrided of necrotic debris 12/05/17* the patient was worked in urgently today because over the weekend he found blood on his incontinence bad when he woke up.  He was found to have an ulcer by his wife who does most of his wound care. He came in today for [place] to look at this. He has not had a history of wounds in his buttocks in spite of his paraplegia. 12/08/17; seen in follow-up today at his usual appointment. He was seen earlier this week and found to have a new wound on his buttock. We also follow him for wounds on the left lateral leg, left first second toe web space and right first second toe web space 12/15/17; we have been using Hydrofera Blue to the left lateral leg which has improved. The right first second toe web space has also improved. Left first second toe web space plantar aspect looks stable. The left buttock has worsened using Santyl. Apparently the buttock has drainage 12/22/17; we have been using Hydrofera Blue to the left lateral leg which continues to improve now 2 small wounds separated by normal skin. He tells [place] he had a fever up to 100 yesterday he is prone to UTIs but  has not noted anything different. He does in and out catheterizations. The area between the first and second toes today does not look good necrotic surface covered with what looks to be purulent drainage and erythema extending into the third toe. I had gotten this to something that I thought look better last time however it is not look good today. He also has a necrotic surface over the buttock wound which is expanded. I thought there might be infection under here so I removed a lot of the surface with a #5 curet though nothing look like it really needed culturing. He is been using Santyl to this area 12/27/17; his original wound on the left lateral leg continues to improve using Hydrofera Blue. I gave him samples of Baxdella although he was unable to take them out of fear for an allergic reaction ["lump in his throat"].the culture I did of the purulent drainage from his second toe last week showed both enterococcus and a set Enterobacter I was also concerned about the erythema on the bottom of his foot although paradoxically although this looks somewhat better today. 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 We've been using silver alginate here. They came up today that he sleeps in a recliner, I'm not sure why but I asked him to stop this 01/03/18; his original wound we've been using Hydrofera Blue is now separated into 2 areas. Ulcer on his left buttock is better he is off the recliner and sleeping in bed Finally both wound areas between his first and second toes also looks some better 01/10/18; his original wound on the left lateral leg is now separated into 2 wounds we've been using Hydrofera Blue Ulcer on his left buttock has some drainage. There is a small probing site going into muscle layer superiorly.using silver alginate -He arrives today with a deep tissue injury on the left heel The wound on the  dorsal aspect of his first second toe on the left looks a lot betterusing silver alginate ketoconazole The area on the first second toe web space on the right also looks a lot bette 01/17/18; his original wound on the left lateral leg continues to progress using Hydrofera Blue Ulcer on his left buttock also is smaller surface healthier except for a small probing site going into the muscle layer superiorly. 2.4 cm of tunneling in this  area DTI on his left heel we have only been offloading. Looks better than last week no threatened open no evidence of infection the wound on the dorsal aspect of the first second toe on the left continues to look like it's regressing we have only been using silver alginate and terbinafine orally The area in the first second toe web space on the right also looks to be a lot better using silver alginate and terbinafine I think this was prompted by tinea pedis 01/31/18; the patient was hospitalized in [place] last week apparently for a complicated UTI. He was discharged on cefepime he does in and out catheterizations. In the hospital he was discovered M I don't mild elevation of AST and ALT and the terbinafine was stopped.predictably the pressure ulcer on s his buttock looks betterusing silver alginate. The area on the left lateral leg also is better using Hydrofera Blue. The area between the first and second toes on the left better. First and second toes on the right still substantial but better. Finally the DTI on the left heel has held together and looks like it's resolving 02/07/18-he is here in follow-up evaluation for multiple ulcerations. He has new injury to the lateral aspect of the last issue a pressure ulcer, he states this is from adhesive removal trauma. He states he has tried multiple adhesive products with no success. All other ulcers appear stable. The left heel DTI is resolving. We will continue with same treatment plan and follow-up next week. 02/14/18;  follow-up for multiple areas. He has a new area last week on the lateral aspect of his pressure ulcer more over the posterior trochanter. The original pressure ulcer looks quite stable has healthy granulation. 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 Almost fully epithelialized on the original superior area using Hydrofera Blue DTI on the left heel has peeled off this week to reveal a small superficial wound under denuded skin and subcutaneous tissue Both areas between the first and second toes look better including nothing open on the left 02/21/18; The patient's wounds on his left ischial tuberosity and posterior left greater trochanter actually looked better. He has a large area of irritation around the area which I think is contact dermatitis. I am doubtful that this is fungal His original wound on the left lateral calf continues to improve we have been using Hydrofera Blue There is no open area in the left first second toe web space although there is a lot of thick callus The DTI on the left heel required debridement today of necrotic surface eschar and subcutaneous tissue using silver alginate Finally the area on the right first second toe webspace continues to contract using silver alginate and ketoconazole 02/28/18 Left ischial tuberosity wounds look better using silver alginate. Original wound on the left calf only has one small open area left using Hydrofera Blue DTI on the left heel required debridement mostly removing skin from around this wound surface. Using silver alginate The areas on the right first/second toe web space using silver alginate and ketoconazole 03/08/18 on evaluation today patient appears to be doing decently well as best I can tell in regard to his wounds. This is the first time that I have seen him as he generally is followed by Dr. Dellia Nims. With that being said  none of his wounds appear to be infected he does have an area where there is some skin  covering what appears to be a new wound on the left dorsal surface of his great toe. This is right at the nail bed. With that being said I do believe that debrided away some of the excess skin can be of benefit in this regard. Otherwise he has been tolerating the dressing changes without complication. 03/14/18; patient arrives today with the multiplicity of wounds that we are following. He has not been systemically unwell Original wound on the left lateral calf now only has 2 small open areas we've been using Hydrofera Blue which should continue The deep tissue injury on the left heel requires debridement today. We've been using silver alginate The left first second toe and the right first second toe are both are reminiscence what I think was tinea pedis. Apparently some of the callus Surface between the toes was removed last week when it started draining. Purulent drainage coming from the wound on the ischial tuberosity on the left. 03/21/18-He is here in follow-up evaluation for multiple wounds. 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 He tolerated debridement. The only change to last week's recommendations is to discontinue antifungal cream between toes. He will follow-up next week 03/28/18; following up for multiple wounds;Concern this week is streaking redness and swelling in the right foot. He is going to need antibiotics for this. 03/31/18; follow-up for right foot cellulitis. Streaking redness and swelling in the right foot on 03/28/18. 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 I put him on clindamycin 300 mg every 6 and brought him in for a quick check. He has an open wound between his first and second toes on the right foot as a potential source. 04/04/18; Right foot cellulitis is resolving he is completing  clindamycin. This is truly good news Left lateral calf wound which is initial wound only has one small open area inferiorly this is close to healing out. He has compression stockings. We will use Hydrofera Blue right down to the epithelialization of this Nonviable surface on the left heel which was initially pressure with a DTI. We've been using Hydrofera Blue. I'm going to switch this back to silver alginate Left first second toe/tinea pedis this looks better using silver alginate Right first second toe tinea pedis using silver alginate Large pressure ulcers on theLeft ischial tuberosity. Small wound here Looks better. I am uncertain about the surface over the large wound. Using silver alginate 04/11/18; Cellulitis in the right foot is resolved Left lateral calf wound which was his original wounds still has 2 tiny open areas remaining this is just about closed Nonviable surface on the left heel is better but still requires debridement Left first second toe/tinea pedis still open using silver alginate Right first second toe wound tinea pedis I asked him to go back to using ketoconazole and silver alginate Large pressure ulcers on the left ischial tuberosity this shear injury here is resolved. Wound is smaller. No evidence of infection using silver alginate 04/18/18; Patient arrives with an intense area of cellulitis in the right mid lower calf extending into the right heel area. Bright red and warm. Smaller area on the left anterior leg. He has a significant history of MRSA. He will definitely need antibioticsdoxycycline He now has 2 open areas on the left ischial tuberosity the original large wound and now a satellite area which I think was above his initial satellite areas. Not a wonderful surface on this satellite area surrounding erythema  which looks like pressure related. His left lateral calf wound again his original wound is just about closed Left heel pressure injury still requiring  debridement Left first second toe looks a lot better using silver alginate Right first second toe also using silver alginate and ketoconazole cream also looks better 04/20/18; the patient was worked in early today out of concerns with his cellulitis on the right leg. I had started him on doxycycline. This was 2 days ago. His wife was concerned about the swelling in the area. Also concerned about the left buttock. He has not been systemically unwell no fever chills. No nausea vomiting or diarrhea 04/25/18; the patient's left buttock wound is continued to deteriorate he is using Hydrofera Blue. He is still completing clindamycin for the cellulitis on the right leg although all of this looks better. 05/02/18 Left buttock wound still with a lot of drainage and a very tightly adherent fibrinous necrotic surface. He has a deeper area superiorly The left lateral calf wound is still closed DTI wound on the left heel necrotic surface especially the circumference using Iodoflex Areas between his left first second toe and right first second toe both look better. Dorsally and the right first second toe he had a necrotic surface although at smaller. In using silver alginate and ketoconazole. I did a culture last week which was a deep tissue culture of the reminiscence of the open wound on the right first second toe dorsally. This grew a few Acinetobacter and a few methicillin-resistant staph aureus. Nevertheless the area actually this week looked better. I didn't feel the need to specifically address this at least in terms of systemic antibiotics. 05/09/18; wounds are measuring larger more drainage per our intake. We are using Santyl covered with alginate on the large superficial buttock wounds, Iodosorb on the left heel, ketoconazole and silver alginate to the dorsal first and second toes bilaterally. 05/16/18; The area on his left buttock better in some aspects although the area superiorly over the ischial  tuberosity required an extensive debridement.using Santyl Left heel appears stable. Using Iodoflex The areas between his first and second toes are not bad however there is spreading erythema up the dorsal aspect of his left foot this looks like cellulitis again. He is insensate the erythema is really very brilliant.o Erysipelas He went to see an allergist days ago because he was itching part of this he had lab work done. This showed a white count of 15.1 with 70% neutrophils. Hemoglobin of 11.4 and a platelet count of 659,000. Last white count we had in Epic was a 2-1/2 years ago which was 25.9 but he was ill at the time. He was able to show me some lab work that was done by his primary physician the pattern is about the same. 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 But prompted me to go ahead and do x-rays of both feet and the pelvis rule out osteomyelitis. He also had a comprehensive metabolic panel this was reasonably normal his albumin was 3.7 liver function tests BUN/creatinine all normal 05/23/18; x-rays of both his feet from last week were negative for underlying pulmonary abnormality. The x-ray of his pelvis however showed mild irregularity in the left ischial which may represent some early osteomyelitis. The wound in the left ischial continues to get deeper clearly now exposed muscle. Each week necrotic surface material over this area. Whereas the rest of the wounds do not look so bad.  The left ischial wound we have been using Santyl and calcium alginate T the left heel surface necrotic debris using Iodoflex o The left lateral leg is still healed Areas on the left dorsal foot and the right dorsal foot are about the same. There is some inflammation on the left which might represent contact dermatitis, fungal dermatitis I am doubtful cellulitis although this looks better than last week 05/30/18; CT scan done at Hospital did not show any  osteomyelitis or abscess. Suggested the possibility of underlying cellulitis although I don't see a lot of evidence of this at the bedside The wound itself on the left buttock/upper thigh actually looks somewhat better. No debridement Left heel also looks better no debridement continue Iodoflex Both dorsal first second toe spaces appear better using Lotrisone. Left still required debridement 06/06/18; Intake reported some purulent looking drainage from the left gluteal wound. Using Santyl and calcium alginate Left heel looks better although still a nonviable surface requiring debridement The left dorsal foot first/second webspace actually expanding and somewhat deeper. I may consider doing a shave biopsy of this area Right dorsal foot first/second webspace appears stable to improved. Using Lotrisone and silver alginate to both these areas 06/13/18 Left gluteal surface looks better. Now separated in the 2 wounds. No debridement required. Still drainage. We'll continue silver alginate Left heel continues to look better with Iodoflex continue this for at least another week Of his dorsal foot wounds the area on the left still has some depth although it looks better than last week. We've been using Lotrisone and silver alginate 06/20/18 Left gluteal continues to look better healthy tissue Left heel continues to look better healthy granulation wound is smaller. He is using Iodoflex and his long as this continues continue the Iodoflex Dorsal right foot looks better unfortunately dorsal left foot does not. There is swelling and erythema of his forefoot. He had minor trauma to this several days ago but doesn't think this was enough to have caused any tissue injury. Foot looks like cellulitis, we have had this problem before 06/27/18 on evaluation today patient appears to be doing a little worse in regard to his foot ulcer. 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 Both of which would really be his only options for treating this infection. In the past he has been given and effusion of Orbactiv. This is done very well for him in the past again it's one time dosing IV antibiotic therapy. Subsequently I do believe this is something we're gonna need to see about doing at this point in time. Currently his other wounds seem to be doing somewhat better in my pinion I'm pretty happy in that regard. 07/03/18 on evaluation today patient's wounds actually appear to be doing fairly well. He has been tolerating the dressing changes without complication. All in all he seems to be showing signs of improvement. In regard to the antibiotics he has been dealing with infectious disease since I saw him last week as far as getting this scheduled. In the end [facility]'s [facility] this coming Friday. In the meantime he has been continuing to perform the dressing changes in such as previous. There does not appear to be any evidence of infection worsengin at this time. 07/10/18; Since I last saw this man 2 [name]  ago things have actually improved. IV antibiotics of resulted in less forefoot erythema although there is still some present. He is not systemically unwell Left buttock wounds 2 now have no depth there is increased epithelialization Using silver alginate Left heel still requires debridement using Iodoflex Left dorsal foot still with a sizable wound about the size of a border but healthy granulation Right dorsal foot still with a slitlike area using silver alginate 07/18/18; the patient's cellulitis in the left foot is improved in fact I think it is on its way to resolving. Left buttock wounds 2 both look better although the larger one has hypertension granulation we've been using silver alginate Left heel has some thick  circumferential redundant skin over the wound edge which will need to be removed today we've been using Iodoflex Left dorsal foot is still a sizable wound required debridement using silver alginate The right dorsal foot is just about closed only a small open area remains here 07/25/18; left foot cellulitis is resolved Left buttock wounds 2 both look better. Hyper-granulation on the major area Left heel as some debris over the surface but otherwise looks a healthier wound. Using silver collagen Right dorsal foot is just about closed 07/31/18; arrives with our intake nurse worried about purulent drainage from the buttock. We had hyper-granulation here last week His buttock wounds 2 continue to look better Left heel some debris over the surface but measuring smaller. Right dorsal foot unfortunately has openings between the toes Left foot superficial wound looks less aggravated. 08/07/18 Buttock wounds continue to look better although some of her granulation and the larger medial wound. silver alginate Left heel continues to look a lot better.silver collagen Left foot superficial wound looks less stable. Requires debridement. He has a new wound superficial area on the foot on the lateral dorsal foot. Right foot looks better using silver alginate without Lotrisone 08/14/2018; patient was in the ER last week diagnosed with a UTI. He is now on Cefpodoxime and Macrodantin. Buttock wounds continued to be smaller. Using silver alginate Left heel continues to look better using silver collagen Left foot superficial wound looks as though it is improving Right dorsal foot area is just about healed. 08/21/2018; patient is completed his antibiotics for his UTI. He has 2 open areas on the buttocks. There is still not closed although the surface looks satisfactory. Using silver alginate Left heel continues to improve using silver collagen The bilateral dorsal foot areas which are at the base of his first and second  toes/possible tinea pedis are actually stable on the left but worse on the right. The area on the left required debridement of necrotic surface. After debridement I obtained a specimen for PCR culture. The right dorsal foot which is been just about healed last week is now reopened 08/28/2018; culture done on the left dorsal foot showed coag negative staph both staph epidermidis and Lugdunensis. I think this is worthwhile initiating systemic treatment. I will use doxycycline given his long list of allergies. The area on the left heel slightly improved but still requiring debridement. The large wound on the buttock is just about closed whereas the smaller one is larger. Using silver alginate in this area 09/04/2018; patient is completing his doxycycline for the left foot although this continues to be a very difficult wound area with very adherent necrotic debris. We are using silver alginate to all his wounds right foot left foot and the small wounds on his buttock, silver collagen on the left heel. 09/11/2018;  once again this patient has intense erythema and swelling of the left forefoot. Lesser degrees of erythema in the right foot. He has a long list of allergies and intolerances. I will reinstitute doxycycline. 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 Using silver alginate Left heel also looks better using silver collagen Unfortunately both the areas on his feet look worse. The area on the left first second webspace is now gone through to the plantar part of his foot. The area on the left foot anteriorly is irritated with erythema and swelling in the forefoot. 09/25/2018 His wound on the left plantar heel looks better. Using silver collagen The area on the left buttock 2 small remnant areas. One is closed one is still open. Using silver alginate The areas between both his first and second toes look worse. This in spite of long-standing antifungal  therapy with ketoconazole and silver alginate which should have antifungal activity He has small areas around his original wound on the left calf one is on the bottom of the original scar tissue and one superiorly both of these are small and superficial but again given wound history in this site this is worrisome 10/02/2018 Left plantar heel continues to gradually contract using silver collagen Left buttock wound is unchanged using silver alginate The areas on his dorsal feet between his first and second toes bilaterally look about the same. I prescribed clindamycin ointment to see if we can address chronic staph colonization and also the underlying possibility of erythrasma The left lateral lower extremity wound is actually on the lateral part of his ankle. Small open area here. We have been using silver alginate 10/09/2018; Left plantar heel continues to look healthy and contract. No debridement is required Left buttock slightly smaller with a tape injury wound just below which was new this week Dorsal feet somewhat improved I have been using clindamycin Left lateral looks lower extremity the actual open area looks worse although a lot of this is epithelialized. I am going to change to silver collagen today He has a lot more swelling in the right leg although this is not pitting not red and not particularly warm there is a lot of spasm in the right leg usually indicative of people with paralysis of some underlying discomfort. We have reviewed his vascular status from 2017 he had a left greater saphenous vein ablation. I wonder about referring him back to vascular surgery if the area on the left leg continues to deteriorate. 10/16/2018 in today for follow-up and management of multiple lower extremity ulcers. His left Buttock wound is much lower smaller and almost closed completely. The wound to the left ankle has began to reopen with Epithelialization and some adherent slough. He has multiple  new areas to the left foot and leg. The left dorsal foot without much improvement. Wound present between left great webspace and 2nd toe. Erythema and edema present right leg. Right LE ultrasound obtained on 10/10/18 was negative for DVT . 10/23/2018; Left buttock is closed over. Still dry macerated skin but there is no open wound. I suspect this is chronic pressure/moisture Left lateral calf is quite a bit worse than when I saw this last. There is clearly drainage here he has macerated skin into the left plantar heel. We will change the primary dressing to alginate Left dorsal foot has some improvement in overall wound area. Still using clindamycin and silver alginate Right dorsal foot about the same  as the left using clindamycin and silver alginate The erythema in the right leg has resolved. He is DVT rule out was negative Left heel pressure area required debridement although the wound is smaller and the surface is health 10/26/2018 The patient came back in for his nurse check today predominantly because of the drainage coming out of the left lateral leg with a recent reopening of his original wound on the left lateral calf. He comes in today with a large amount of surrounding erythema around the wound extending from the calf into the ankle and even in the area on the dorsal foot. He is not systemically unwell. He is not febrile. Nevertheless this looks like cellulitis. We have been using silver alginate to the area. I changed him to a regular visit and I am going to prescribe him doxycycline. The rationale here is a long list of medication intolerances and a history of MRSA. I did not see anything that I thought would provide a valuable culture 10/30/2018 Follow-up from his appointment 4 days ago with really an extensive area of cellulitis in the left calf left lateral ankle and left dorsal foot. I put him on doxycycline. He has a long list of medication allergies which are true allergy reactions.  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 In any case he is a lot better today. The erythema especially in the anterior and lateral left calf is better. He still has left ankle erythema. He also is complaining about increasing edema in the right leg we have only been using Kerlix Coban and he has been doing the wraps at home. Finally he has a spotty rash on the medial part of his upper left calf which looks like folliculitis or perhaps wrap occlusion type injury. Small superficial macules not pustules 11/06/18 patient arrives today with again a considerable degree of erythema around the wound on the left lateral calf extending into the dorsal ankle and dorsal foot. This is a lot worse than when I saw this last week. He is on doxycycline really with not a lot of improvement. He has not been systemically unwell Wounds on the; left heel actually looks improved. Original area on the left foot and proximity to the first and second toes looks about the same. He has superficial areas on the dorsal foot, anterior calf and then the reopening of his original wound on the left lateral calf which looks about the same The only area he has on the right is the dorsal webspace first and second which is smaller. He has a large area of dry erythematous skin on the left buttock small open area here. 11/13/2018; the patient arrives in much better condition. The erythema around the wound on the left lateral calf is a lot better. Not sure whether this was the clindamycin or the TCA and ketoconazole or just in the improvement in edema control [stasis dermatitis]. In any case this is a lot better. The area on the left heel is very small and just about resolved using silver collagen we have been using silver alginate to the areas on his dorsal feet 11/20/2018; his wounds include the left lateral calf, left heel, dorsal aspects of both feet just proximal to  the first second webspace. He is stable to slightly improved. I did not think any changes to his dressings were going to be necessary 11/27/2018 he has a reopening on the left buttock which is surrounded by  what looks like tinea or perhaps some other form of dermatitis. The area on the left dorsal foot has some erythema around it I have marked this area but I am not sure whether this is cellulitis or not. Left heel is not closed. Left calf the reopening is really slightly longer and probably worse 1/13; in general things look better and smaller except for the left dorsal foot. Area on the left heel is just about closed, left buttock looks better only a small wound remains in the skin looks better [using Lotrisone] 1/20; the area on the left heel only has a few remaining open areas here. Left lateral calf about the same in terms of size, left dorsal foot slightly larger right lateral foot still not closed. The area on the left buttock has no open wound and the surrounding skin looks a lot better 1/27; the area on the left heel is closed. Left lateral calf better but still requiring extensive debridements. The area on his left buttock is closed. He still has the open areas on the left dorsal foot which is slightly smaller in the right foot which is slightly expanded. We have been using Iodoflex on these areas as well 2/3; left heel is closed. Left lateral calf still requiring debridement using Iodoflex there is no open area on his left buttock however he has dry scaly skin over a large area of this. Not really responding well to the Lotrisone. Finally the areas on his dorsal feet at the level of the first second webspace are slightly smaller on the right and about the same on the left. Both of these vigorously debrided with Anasept and gauze 2/10; left heel remains closed he has dry erythematous skin over the left buttock but there is no open wound here. Left lateral leg has come in and with.  Still requiring debridement we have been using Iodoflex here. Finally the area on the left dorsal foot and right dorsal foot are really about the same extremely dry callused fissured areas. He does not yet have a dermatology appointment 2/17; left heel remains closed. He has a new open area on the left buttock. The area on the left lateral calf is bigger longer and still covered in necrotic debris. No major change in his foot areas bilaterally. I am awaiting for a dermatologist to look on this. We have been using ketoconazole I do not know that this is been doing any good at all. 2/24; left heel remains closed. The left buttock wound that was new reopening last week looks better. The left lateral calf appears better also although still requires debridement. The major area on his foot is the left first second also requiring debridement. We have been putting Prisma on all wounds. I do not believe that the ketoconazole has done too much good for his feet. He will use Lotrisone I am going to give him a 2-week course of terbinafine. We still do not have a dermatology appointment 3/2 left heel remains closed however there is skin over bone in this area I pointed this out to him today. The left buttock wound is epithelialized but still does not look completely stable. The area on the left leg required debridement were using silver collagen here. With regards to his feet we changed to Lotrisone last week and silver alginate. 3/9; left heel remains closed. Left buttock remains closed. The area on the right foot is essentially closed. The left foot remains unchanged. Slightly smaller on the left lateral calf.  Using silver collagen to both of these areas 3/16-Left heel remains closed. Area on right foot is closed. Left lateral calf above the lateral malleolus open wound requiring debridement with easy bleeding. Left dorsal wound proximal to first toe also debrided. Left ischial area open new. Patient has been  using Prisma with wrapping every 3 days. Dermatology appointment is apparently tomorrow.Patient has completed his terbinafine 2-week course with some apparent improvement according to him, there is still flaking and dry skin in his foot on the left 3/23; area on the right foot is reopened. The area on the left anterior foot is about the same still a very necrotic adherent surface. He still has the area on the left leg and reopening is on the left buttock. He apparently saw dermatology although I do not have a note. According to the patient who is usually fairly well informed they did not have any good ideas. Put him on oral terbinafine which she is been on before. 3/30; using silver collagen to all wounds. Apparently his dermatologist put him on doxycycline and rifampin presumably some culture grew staph. I do not have this result. He remains on terbinafine although I have used terbinafine on him before 4/6; patient has had a fairly substantial reopening on the right foot between the first and second toes. He is finished his terbinafine and I believe is on doxycycline and rifampin still as prescribed by dermatology. We have been using silver collagen to all his wounds although the patient reports that he thinks silver alginate does better on the wounds on his buttock. 4/13; the area on his left lateral calf about the same size but it did not require debridement. Left dorsal foot just proximal to the webspace between the first and second toes is about the same. Still nonviable surface. I note some superficial bronze discoloration of the dorsal part of his foot Right dorsal foot just proximal to the first and second toes also looks about the same. I still think there may be the same discoloration I noted above on the left Left buttock wound looks about the same 4/20; left lateral calf appears to be gradually contracting using silver collagen. He remains on erythromycin empiric treatment for possible  erythrasma involving his digital spaces. The left dorsal foot wound is debrided of tightly adherent necrotic debris and really cleans up quite nicely. The right area is worse with expansion. I did not debride this it is now over the base of the second toe The area on his left buttock is smaller no debridement is required using silver collagen 5/4; left calf continues to make good progress. He arrives with erythema around the wounds on his dorsal foot which even extends to the plantar aspect. Very concerning for coexistent infection. He is finished the erythromycin I gave him for possible erythrasma this does not seem to have helped. The area on the left foot is about the same base of the dorsal toes Is area on the buttock looks improved on the left 5/11; left calf and left buttock continued to make good progress. Left foot is about the same to slightly improved. Major problem is on the right foot. He has not had an x-ray. Deep tissue culture I did last week showed both Enterobacter and E. coli. I did not change the doxycycline I put him on empirically although neither 1 of these were plated to doxycycline. He arrives today with the erythema looking worse on both the dorsal and plantar foot. Macerated skin on the  bottom of the foot. he has not been systemically unwell 5/18-Patient returns at 1 week, left calf wound appears to be making some progress, left buttock wound appears slightly worse than last time, left foot wound looks slightly better, right foot redness is marginally better. X-ray of both feet show no air or evidence of osteomyelitis. Patient is finished his Omnicef and terbinafine. He continues to have macerated skin on the bottom of the left foot as well as right 5/26; left calf wound is better, left buttock wound appears to have multiple small superficial open areas with surrounding macerated skin. X-rays that I did last time showed no evidence of osteomyelitis in either foot. He is  finished cefdinir and doxycycline. I do not think that he was on terbinafine. He continues to have a large superficial open area on the right foot anterior dorsal and slightly between the first and second toes. I did send him to dermatology 2 months ago or so wondering about whether they would do a fungal scraping. I do not believe they did but did do a culture. We have been using silver alginate to the toe areas, he has been using antifungals at home topically either ketoconazole or Lotrisone. We are using silver collagen on the left foot, silver alginate on the right, silver collagen on the left lateral leg and silver alginate on the left buttock 6/1; left buttock area is healed. We have the left dorsal foot, left lateral leg and right dorsal foot. We are using silver alginate to the areas on both feet and silver collagen to the area on his left lateral calf 6/8; the left buttock apparently reopened late last week. He is not really sure how this happened. He is tolerating the terbinafine. Using silver alginate to all wounds 6/15; left buttock wound is larger than last week but still superficial. Came in the clinic today with a report of purulence from the left lateral leg I did not identify any infection Both areas on his dorsal feet appear to be better. He is tolerating the terbinafine. Using silver alginate to all wounds 6/22; left buttock is about the same this week, left calf quite a bit better. His left foot is about the same however he comes in with erythema and warmth in the right forefoot once again. Culture that I gave him in the beginning of May showed Enterobacter and E. coli. I gave him doxycycline and things seem to improve although neither 1 of these organisms was specifically plated. 6/29; left buttock is larger and dry this week. Left lateral calf looks to me to be improved. Left dorsal foot also somewhat improved right foot completely unchanged. The erythema on the right foot is  still present. He is completing the Ceftin dinner that I gave him empirically [see discussion above.) 7/6 - All wounds look to be stable and perhaps improved, the left buttock wound is slightly smaller, per patient bleeds easily, completed ceftin, the right foot redness is less, he is on terbinafine 7/13; left buttock wound about the same perhaps slightly narrower. Area on the left lateral leg continues to narrow. Left dorsal foot slightly smaller right foot about the same. We are using silver alginate on the right foot and Hydrofera Blue to the areas on the left. Unna boot on the left 2 layer compression on the right 7/20; left buttock wound absolutely the same. Area on lateral leg continues to get better. Left dorsal foot require debridement as did the right no major change in the  7/27; left buttock wound the same size necrotic debris over the surface. The area on the lateral leg is closed once again. His left foot looks better right foot about the same although there is some involvement now of the posterior first second toe area. He is still on terbinafine which I have given him for a month, not certain a centimeter major change 06/25/19-All wounds appear to be slightly improved according to report, left buttock wound looks clean, both foot wounds have minimal to no debris the right dorsal foot has minimal slough. We are using Hydrofera Blue to the left and silver alginate to the right foot and ischial wound. 8/10-Wounds all appear to be around the same, the right forefoot distal part has some redness which was not there before, however the wound looks clean and small. Ischial wound looks about the same with no changes 8/17; his wound on the left lateral calf which was his original chronic venous insufficiency wound remains closed. Since I last saw him the areas on the left dorsal foot right dorsal foot generally appear better but require debridement. The area on his left initial tuberosity appears  somewhat larger to me perhaps hyper granulated and bleeds very easily. We have been using Hydrofera Blue to the left dorsal foot and silver alginate to everything else 8/24; left lateral calf remains closed. The areas on his dorsal feet on the webspace of the first and second toes bilaterally both look better. The area on the left buttock which is the pressure ulcer stage II slightly smaller. I change the dressing to Hydrofera Blue to all areas 8/31; left lateral calf remains closed. The area on his dorsal feet bilaterally look better. Using Hydrofera Blue. Still requiring debridement on the left foot. No change in the left buttock pressure ulcers however 9/14; left lateral calf remains closed. Dorsal feet look quite a bit better than 2 [name] ago. Flaking dry skin also a lot better with the ammonium lactate I gave him 2 [name] ago. The area on the left buttock is improved. He states that his Roho cushion developed a leak and he is getting a new one, in the interim he is offloading this vigorously 9/21; left calf remains closed. Left heel which was a possible DTI looks better this week. He had macerated tissue around the left dorsal foot right foot looks satisfactory and improved left buttock wound. I changed his dressings to his feet to silver alginate bilaterally. Continuing Hydrofera Blue on the left buttock. 9/28 left calf remains closed. Left heel did not develop anything [possible DTI] dry flaking skin on the left dorsal foot. Right foot looks satisfactory. Improved left buttock wound. We are using silver alginate on his feet Hydrofera Blue on the buttock. I have asked him to go back to the Lotrisone on his feet including the wounds and surrounding areas 10/5; left calf remains closed. The areas on the left and right feet about the same. A lot of this is epithelialized however debris over the remaining open areas. He is using Lotrisone and silver alginate. The area on the left buttock using  Hydrofera Blue 10/26. Patient has been out for 3 [name] secondary to Covid concerns. He tested negative but I think his wife tested positive. He comes in today with the left foot substantially worse, right foot about the same. Even more concerning he states that the area on his left buttock closed over but then reopened and is considerably deeper in one aspect than it was before [place]  III wound] 11/2; left foot really about the same as last week. Quarter sized wound on the dorsal foot just proximal to the first second toes. Surrounding erythema with areas of denuded epithelium. This is not really much different looking. Did not look like cellulitis this time however. Right foot area about the same.. We have been using silver alginate alginate on his toes Left buttock still substantial irritated skin around the wound which I think looks somewhat better. We have been using Hydrofera Blue here. 11/9; left foot larger than last week and a very necrotic surface. Right foot I think is about the same perhaps slightly smaller. Debris around the circumference also addressed. Unfortunately on the left buttock there is been a decline. Satellite lesions below the major wound distally and now a an additional one posteriorly we have been using Hydrofera Blue but I think this is a pressure issue 11/16; left foot ulcer dorsally again a very adherent necrotic surface. Right foot is about the same. Not much change in the pressure ulcer on his left buttock. 11/30; left foot ulcer dorsally basically the same as when I saw him 2 [name] ago. Very adherent fibrinous debris on the wound surface. Patient reports a lot of drainage as well. 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 We have been using Iodoflex, patient changed back to alginate because of the drainage. Area on his right dorsal foot really looks benign with a healthier surface certainly a lot better than on the left.  Left buttock wounds all improved using Hydrofera Blue 12/7; left dorsal foot again no improvement. Tightly adherent debris. PCR culture I did last week only showed likely skin contaminant. I have gone ahead and done a punch biopsy of this which is about the last thing in terms of investigations I can think to do. He has known venous insufficiency and venous hypertension and this could be the issue here. The area on the right foot is about the same left buttock slightly worse according to our intake nurse secondary to [facility] Blue sticking to the wound 12/14; biopsy of the left foot that I did last time showed changes that could be related to wound healing/chronic stasis dermatitis phenomenon no neoplasm. We have been using silver alginate to both feet. I change the one on the left today to Sorbact and silver alginate to his other 2 wounds 12/28; the patient arrives with the following problems; Major issue is the dorsal left foot which continues to be a larger deeper wound area. Still with a completely nonviable surface Paradoxically the area mirror image on the right on the right dorsal foot appears to be getting better. He had some loss of dry denuded skin from the lower part of his original wound on the left lateral calf. Some of this area looked a little vulnerable and for this reason we put him in wrap that on this side this week The area on his left buttock is larger. He still has the erythematous circular area which I think is a combination of pressure, sweat. This does not look like cellulitis or fungal dermatitis 11/26/2019; -Dorsal left foot large open wound with depth. Still debris over the surface. Using Sorbact The area on the dorsal right foot paradoxically has closed over He has a reopening on the left ankle laterally at the base of his original wound that extended up into the calf. This appears clean. The left buttock wound is smaller but with very adherent necrotic  debris over the  surface. We have been using silver alginate here as well The patient had arterial studies done in 2017. He had biphasic waveforms at the dorsalis pedis and posterior tibial bilaterally. ABI in the left was 1.17. Digit waveforms were dampened. He has slight spasticity in the great toes I do not think a TBI would be possible 1/11; the patient comes in today with a sizable reopening between the first and second toes on the right. This is not exactly in the same location where we have been treating wounds previously. According to our intake nurse this was actually fairly deep but 0.6 cm. The area on the left dorsal foot looks about the same the surface is somewhat cleaner using Sorbact, his MRI is in 2 days. We have not managed yet to get arterial studies. The new reopening on the left lateral calf looks somewhat better using alginate. The left buttock wound is about the same using alginate 1/18; the patient had his ARTERIAL studies which were quite normal. ABI in the right at 1.13 with triphasic/biphasic waveforms on the left ABI 1.06 again with triphasic/biphasic waveforms. It would not have been possible to have done a toe brachial index because of spasticity. We have been using Sorbac to the left foot alginate to the rest of his wounds on the right foot left lateral calf and left buttock 1/25; arrives in clinic with erythema and swelling of the left forefoot worse over the first MTP area. This extends laterally dorsally and but also posteriorly. Still has an area on the left lateral part of the lower part of his calf wound it is eschared and clearly not closed. Area on the left buttock still with surrounding irritation and erythema. Right foot surface wound dorsally. The area between the right and first and second toes appears better. 2/1; The left foot wound is about the same. Erythema slightly better I gave him a week of doxycycline empirically Right foot wound is more extensive extending between  the toes to the plantar surface Left lateral calf really no open surface on the inferior part of his original wound however the entire area still looks vulnerable Absolutely no improvement in the left buttock wound required debridement. 2/8; the left foot is about the same. Erythema is slightly improved I gave him clindamycin last week. Right foot looks better he is using Lotrimin and silver alginate He has a breakdown in the left lateral calf. Denuded epithelium which I have removed Left buttock about the same were using Hydrofera Blue 2/15; left foot is about the same there is less surrounding erythema. Surface still has tightly adherent debris which I have debriding however not making any progress Right foot has a substantial wound on the medial right second toe between the first and second webspace. Still an open area on the left lateral calf distal area. Buttock wound is about the same 2/22; left foot is about the same less surrounding erythema. Surface has adherent debris. Polymen Ag Right foot area significant wound between the first and second toes. We have been using silver alginate here Left lateral leg polymen Ag at the base of his original venous insufficiency wound Left buttock some improvement here 3/1; Right foot is deteriorating in the first second toe webspace. Larger and more substantial. We have been using silver alginate. Left dorsal foot about the same markedly adherent surface debris using PolyMem Ag Left lateral calf surface debris using PolyMem AG Left buttock is improved again using PolyMem Ag. He is  completing his terbinafine. The erythema in the foot seems better. He has been on this for 2 [name] 3/8; no improvement in any wound area in fact he has a small open area on the dorsal midfoot which is new this week. He has not gotten his foot x-rays yet 3/15; his x-rays were both negative for osteomyelitis of both feet. No major change in any of his wounds on the  extremities however his buttock wounds are better. We have been using polymen on the buttocks, left lower leg. Iodoflex on the left foot and silver alginate on the right 3/22; arrives in clinic today with the 2 major issues are the improvement in the left dorsal foot wound which for once actually looks healthy with a nice healthy wound surface without debridement. Using Iodoflex here. Unfortunately on the left lateral calf which is in the distal part of his original wound he came to the clinic here for there was purulent drainage noted some increased breakdown scattered around the original area and a small area proximally. We we are using polymen here will change to silver alginate today. His buttock wound on the left is better and I think the area on the right first second toe webspace is also improved 3/29; left dorsal foot looks better. Using Iodoflex. Left ankle culture from deterioration last time grew E. coli, Enterobacter and Enterococcus. I will give him a course of cefdinir although that will not cover Enterococcus. The area on the right foot in the webspace of the first and second toe lateral first toe looks better. The area on his buttock is about healed Vascular appointment is on April 21. This is to look at his venous system vis--vis continued breakdown of the wounds on the left including the left lateral leg and left dorsal foot he. He has had previous ablations on this side 4/5; the area between the right first and second toes lateral aspect of the first toe looks better. Dorsal aspect of the left first toe on the left foot also improved. Unfortunately the left lateral lower leg is larger and there is a second satellite wound superiorly. The usual superficial abrasions on the left buttock overall better but certainly not closed 4/12; the area between the right first and second toes is improved. Dorsal aspect of the left foot also slightly smaller with a vibrant healthy looking  surface. No real change in the left lateral leg and the left buttock wound is healed He has an unaffordable co-pay for Apligraf. Appointment with vein and vascular with regards to the left leg venous part of the circulation is on 4/21 4/19; we continue to see improvement in all wound areas. Although this is minor. He has his vascular appointment on 4/21. The area on the left buttock has not reopened although right in the center of this area the skin looks somewhat threatened 4/26; the left buttock is unfortunately reopened. In general his left dorsal foot has a healthy surface and looks somewhat smaller although it was not measured as such. The area between his first and second toe webspace on the right as a small wound against the first toe. The patient saw vascular surgery. The real question I was asking was about the small saphenous vein on the left. He has previously ablated left greater saphenous vein. Nothing further was commented on on the left. Right greater saphenous vein without reflux at the saphenofemoral junction or proximal thigh there was no indication for ablation of the right greater saphenous vein duplex  was negative for DVT bilaterally. They did not think there was anything from a vascular surgery point of view that could be offered. They ABIs within normal limits 5/3; only small open area on the left buttock. The area on the left lateral leg which was his original venous reflux is now 2 wounds both which look clean. We are using Iodoflex on the left dorsal foot which looks healthy and smaller. He is down to a very tiny area between the right first and second toes, using silver alginate 5/10; all of his wounds appear better. We have much better edema control in 4 layer compression on the left. This may be the factor that is allowing the left foot and left lateral calf to heal. He has external compression garments at home 04/14/20-All of his wounds are progressing well, the left  forefoot is practically closed, left ischium appears to be about the same, right toe webspace is also smaller. The left lateral leg is about the same, continue using Hydrofera Blue to this, silver alginate to the ischium, Iodoflex to the toe space on the right 6/7; most of his wounds outside of the left buttock are doing well. The area on the left lateral calf and left dorsal foot are smaller. The area on the right foot in between the first and second toe webspace is barely visible although he still says there is some drainage here is the only reason I did not heal this out. Unfortunately the area on the left buttock almost looks like he has a skin tear from tape. He has open wound and then a large flap of skin that we are trying to get adherence over an area just next to the remaining wound 6/21; 2 week follow-up. I believe is been here for nurse visits. Miraculously the area between his first and second toes on the left dorsal foot is closed over. Still open on the right first second web space. The left lateral calf has 2 open areas. Distally this is more superficial. The proximal area had a little more depth and required debridement of adherent necrotic material. His buttock wound is actually larger we have been using silver alginate here 6/28; the patient's area on the left foot remains closed. Still open wet area between the first and second toes on the right and also extending into the plantar aspect. We have been using silver alginate in this location. He has 2 areas on the left lower leg part of his original long wounds which I think are better. We have been using Hydrofera Blue here. Hydrofera Blue to the left buttock which is stable 7/12; left foot remains closed. Left ankle is closed. May be a small area between his right first and second toes the only truly open area is on the left buttock. We have been using Hydrofera Blue here 7/19; patient arrives with marked deterioration especially in  the left foot and ankle. We did not put him in a compression wrap on the left last week in fact he wore his juxta lite stockings on either side although he does not have an underlying stocking. He has a reopening on the left dorsal foot, left lateral ankle and a new area on the right dorsal ankle. More worrisome is the degree of erythema on the left foot extending on the lateral foot into the lateral lower leg on the left 7/26; the patient had erythema and drainage from the lateral left ankle last week. Culture of this grew MRSA resistant to  doxycycline and clindamycin which are the 2 antibiotics we usually use with this patient who has multiple antibiotic allergies including linezolid, trimethoprim sulfamethoxazole. I had give him an empiric doxycycline and he comes in the area certainly looks somewhat better although it is blotchy in his lower leg. He has not been systemically unwell. He has had areas on the left dorsal foot which is a reopening, chronic wounds on the left lateral ankle. Both of these I think are secondary to chronic venous insufficiency. The area between his first and second toes is closed as far as I can tell. He had a new wrap injury on the right dorsal ankle last week. Finally he has an area on the left buttock. We have been using silver alginate to everything except the left buttock we are using Hydrofera Blue 06/30/20-Patient returns at 1 week, has been given a sample dose pack of NUZYRA which is a tetracycline derivative [omadacycline], patient has completed those, we have been using silver alginate to almost all the wounds except the left ischium where we are using Hydrofera Blue all of them look better 8/16; since I last saw the patient he has been doing well. The area on the left buttock, left lateral ankle and left foot are all closed today. He has completed the [place] I gave him last time and tolerated this well. He still has open areas on the right dorsal ankle and in the  right first second toe area which we are using silver alginate. 8/23; we put him in his bilateral external compression stockings last week as he did not have anything open on either leg except for concerning area between the right first and second toe. He comes in today with an area on the left dorsal foot slightly more proximal than the original wound, the left lateral foot but this is actually a continuation of the area he had on the left lateral ankle from last time. As well he is opened up on the left buttock again. 8/30; comes in today with things looking a lot better. The area on the left lower ankle has closed down as has the left foot but with eschar in both areas. The area on the dorsal right ankle is also epithelialized. Very little remaining of the left buttock wound. We have been using silver alginate on all wound areas 9/13; the area in the first second toe webspace on the right has fully epithelialized. He still has some vulnerable epithelium on the right and the ankle and the dorsal foot. He notes weeping. He is using his juxta lite stocking. On the left again the left dorsal foot is closed left lateral ankle is closed. We went to the juxta lite stocking here as well. Still vulnerable in the left buttock although only 2 small open areas remain here 9/27; 2-week follow-up. We did not look at his left leg but the patient says everything is closed. He is a bit disturbed by the amount of edema in his left foot he is using juxta lite stockings but asking about over the toes stockings which would be 30/40, will talk to him next time. According to him there is no open wound on either the left foot or the left ankle/calf He has an open area on the dorsal right calf which I initially point a wrap injury. He has superficial remaining wound on the left ischial tuberosity been using silver alginate although he says this sticks to the wound 10/5; we gave him 2-week follow-up but  he called yesterday  expressing some concerns about his right foot right ankle and the left buttock. He came in early. There is still no open areas on the left leg and that still in his juxta lite stocking 10/11; he only has 1 small area on the left buttock that remains measuring millimeters 1 mm. Still has the same irritated skin in this area. We recommended zinc oxide when this eventually closes and pressure relief is meticulously is he can do this. He still has an area on the dorsal part of his right first through third toes which is a bit irritated and still open and on the dorsal ankle near the crease of the ankle. We have been using silver alginate and using his own stocking. He has nothing open on the left leg or foot 10/25; 2-week follow-up. Not nearly as good on the left buttock as I was hoping. For open areas with 5 looking threatened small. He has the erythematous irritated chronic skin in this area. 1 area on the right dorsal ankle. He reports this area bleeds easily Right dorsal foot just proximal to the base of his toes We have been using silver alginate. 11/8; 2-week follow-up. Left buttock is about the same although I do not think the wounds are in the same location we have been using silver alginate. I have asked him to use zinc oxide on the skin around the wounds. He still has a small area on the right dorsal ankle he reports this bleeds easily Right dorsal foot just proximal to the base of the toes does not have anything open although the skin is very dry and scaly He has a new opening on the nailbed of the left great toe. Nothing on the left ankle 11/29; 3-week follow-up. Left buttock has 2 open areas. And washing of these wounds today started bleeding easily. Suggesting very friable tissue. We have been using silver alginate. Right dorsal ankle which I thought was initially a wrap injury we have been using silver alginate. Nothing open between the toes that I can see. He states the area on the left  dorsal toe nailbed healed after the last visit in 2 or 3 days 12/13; 3-week follow-up. His left buttock now has 3 open areas but the original 2 areas are smaller using polymen here. Surrounding skin looks better. The right dorsal ankle is closed. He has a small opening on the right dorsal foot at the level of the third toe. In general the skin looks better here. He is wearing his juxta lite stocking on the left leg says there is nothing open 11/24/2020; 3 [name] follow-up. His left buttock still has the 3 open areas. We have been using polymen but due to lack of response he changed to [facility] area. Surrounding skin is dry erythematous and irritated looking. There is no evidence of infection either bacterial or fungal however there is loss of surface epithelium He still has very dry skin in his foot causing irritation and erythema on the dorsal part of his toes. This is not responded to prolonged courses of antifungal simply looks dry and irritated 1/24; left buttock area still looks about the same he was unable to find the triad ointment that we had suggested. The area on the right lower leg just above the dorsal ankle has reopened and the areas on the right foot between the first second and second third toes and scaling on the bottom of the foot has been about the same for  quite some time now. been using silver alginate to all wound areas 2/7; left buttock wound looked quite good although not much smaller in terms of surface area surrounding skin looks better. Only a few dry flaking areas on the right foot in between the first and second toes the skin generally looks better here [ammonium lactate]. Finally the area on the right dorsal ankle is closed 2/21; There is no open area on the right foot even between the right first and second toe. Skin around this area dorsally and plantar aspects look better. He has a reopening of the area on the right ankle just above the crease of the ankle dorsally.  I continue to think that this is probably friction from spasms may be even this time with his stocking under the compression stockings. Wounds on his left buttock look about the same there a couple of areas that have reopened. He has a total square area of loss of epithelialization. This does not look like infection it looks like a contact dermatitis but I just cannot determine to what 3/14; there is nothing on the right foot between the first and second toes this was carefully inspected under illumination. Some chronic irritation on the dorsal part of his foot from toes 1-3 at the base. Nothing really open here substantially. Still has an area on the right foot/ankle that is actually larger and hyper granulated. His buttock area on the left is just about closed however he has chronic inflammation with loss of the surface epithelial layer 3/28; 2-week follow-up. In clinic today with a new wound on the left anterior mid tibia. Says this happened about 2 [name] ago. He is not really sure how wonders about the spasticity of his legs at night whether that could have caused this other than that he does not have a good idea. He has been using topical antibiotics and silver alginate. The area on his right dorsal ankle seems somewhat better. Finally everything on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 each visit still covered an adherent surface slough 09/16/16 patient was in for his usual Thursday nurse visit however the intake nurse noted significant erythema of his dorsal right foot. He is also running a low- grade fever and having increasing spasms in the right leg 09/20/16 here for cellulitis involving his right great toes and forefoot. This is a lot better. Still requiring debridement on his left lateral leg. Santyl direct says he needs prior authorization. Therefore his wife cannot change this at home 09/30/16; the patient's extensive area on the left lateral calf and ankle perhaps somewhat better. Using Santyl. The area on the left toes is healed and I think the area on his right dorsal foot is healed as well. There is no cellulitis or venous inflammation involving the right leg. He is going to need compression stockings here. 10/07/16; the patient's extensive wound on the left lateral calf and ankle does not measure any differently however there appears to be less adherent surface slough using Santyl and aggressive weekly debridements 10/21/16; no major change in the area on the left lateral calf. Still the same measurement still very difficult to debridement adherent slough and nonviable subcutaneous tissue. This is not really been helped by several [name] of Santyl. Previously for 2 [name] I used Iodoflex for a short period. A prolonged course of Hydrofera Blue didn't really help. I'm not sure why I only used 2 [name] of Iodoflex on this there is no evidence of surrounding infection. He has a small area on the right second toe which looks as though it's progressing towards closure 10/28/16; the wounds on his toes appear to be closed. No major  change in the left lateral leg wound although the surface looks somewhat better using Iodoflex. He has had previous arterial studies that were normal. He has had reflux studies and is status post ablation although I don't have any exact notes on which vein was ablated. I'll need to check the surgical record 11/04/16; he's had a reopening between the first and second toe on the left and right. No major change in the left lateral leg wound. There is what appears to be cellulitis of the left dorsal foot 11/18/16 the patient was hospitalized initially in [place] and then subsequently transferred to [facility] and was admitted there from 11/09/16 through 11/12/16. He had developed progressive cellulitis on the right leg in spite of the doxycycline I gave him. I'd spoken to the hospitalist in [place] who was concerned about continuing leukocytosis. CT scan is what I suggested this was done which showed soft tissue swelling without evidence of osteomyelitis or an underlying abscess blood cultures were negative. At [facility] he was treated with vancomycin and Primaxin and then add an infectious disease consult. He was transitioned to Ceftaroline. He has been making progressive improvement. Overall a severe cellulitis of the right leg. He is been using silver alginate to her original wound on the left leg. The wounds in his toes on the right are closed there is a small open area on the base of the left second toe 11/26/15; the patient's right leg is much better although there is still some edema here this could be reminiscent from his severe cellulitis likely on top of some degree of lymphedema. His left anterior leg wound has less surface slough as reported by her  intake nurse. Small wound at the base of the left second toe 12/02/16; patient's right leg is better and there is no open wound here. His left anterior lateral leg wound continues to have a healthy-looking surface. Small wound at the base of the left  second toe however there is erythema in the left forefoot which is worrisome 12/16/16; is no open wounds on his right leg. We took measurements for stockings. His left anterior lateral leg wound continues to have a healthy-looking surface. I'm not sure where we were with the Apligraf run through his insurance. We have been using Iodoflex. He has a thick eschar on the left first second toe interface, I suspect this may be fungal however there is no visible open 12/23/16; no open wound on his right leg. He has 2 small areas left of the linear wound that was remaining last week. We have been using Prisma, I thought I have disclosed this week, we can only look forward to next week 01/03/17; the patient had concerning areas of erythema last week, already on doxycycline for UTI through his primary doctor. The erythema is absolutely no better there is warmth and swelling both medially from the left lateral leg wound and also the dorsal left foot. 01/06/17- Patient is here for follow-up evaluation of his left lateral leg ulcer and bilateral feet ulcers. He is on oral antibiotic therapy, tolerating that. Nursing staff and the patient states that the erythema is improved from Monday. 01/13/17; the predominant left lateral leg wound continues to be problematic. I had put Apligraf on him earlier this month once. However he subsequently developed what appeared to be an intense cellulitis around the left lateral leg wound. I gave him Dalvance I think on 2/12 perhaps 2/13 he continues on cefdinir. The erythema is still present but the warmth and swelling is improved. I am hopeful that the cellulitis part of this control. I wouldn't be surprised if there is an element of venous inflammation as well. 01/17/17. The erythema is present but better in the left leg. His left lateral leg wound still does not have a viable surface buttons certain parts of this long thin wound it appears like there has been improvement in  dimensions. 01/20/17; the erythema still present but much better in the left leg. I'm thinking this is his usual degree of chronic venous inflammation. The wound on the left leg looks somewhat better. Is less surface slough 01/27/17; erythema is back to the chronic venous inflammation. The wound on the left leg is somewhat better. I am back to the point where I like to try an Apligraf once again 02/10/17; slight improvement in wound dimensions. Apligraf #2. He is completing his doxycycline 02/14/17; patient arrives today having completed doxycycline last Thursday. This was supposed to be a nurse visit however once again he hasn't tense erythema from the medial part of his wound extending over the lower leg. Also erythema in his foot this is roughly in the same distribution as last time. He has baseline chronic venous inflammation however this is a lot worse than the baseline I have learned to accept the on him is baseline inflammation 02/24/17- patient is here for follow-up evaluation. He is tolerating compression therapy. His voicing no complaints or concerns he is here anticipating an Apligraf 03/03/17; he arrives today with an adherent necrotic surface. I don't think this is surface is going to be amenable for Apligraf's. The erythema around his wound and on the left dorsal foot has resolved he  is off antibiotics 03/10/17; better-looking surface today. I don't think he can tolerate Apligraf's. He tells me he had a wound VAC after a skin graft years ago to this area and they had difficulty with a seal. The erythema continues to be stable around this some degree of chronic venous inflammation but he also has recurrent cellulitis. We have been using Iodoflex 03/17/17; continued improvement in the surface and may be small changes in dimensions. Using Iodoflex which seems the only thing that will control his surface 03/24/17- He is here for follow up evaluation of his LLE lateral ulceration and ulcer to right  dorsal foot/toe space. He is voicing no complaints or concerns, He is tolerating compression wrap. 03/31/17 arrives today with a much healthier looking wound on the left lower extremity. We have been using Iodoflex for a prolonged period of time which has for the first time prepared and adequate looking wound bed although we have not had much in the way of wound dimension improvement. He also has a small wound between the first and second toe on the right 04/07/17; arrives today with a healthy-looking wound bed and at least the top 50% of this wound appears to be now her. No debridement was required I have changed him to [facility] last week after prolonged Iodoflex. He did not do well with Apligraf's. We've had a re-opening between the first and second toe on the right 04/14/17; arrives today with a healthier looking wound bed contractions and the top 50% of this wound and some on the lesser 50%. Wound bed appears healthy. The area between the first and second toe on the right still remains problematic 04/21/17; continued very gradual improvement. Using [facility] 04/28/17; continued very gradual improvement in the left lateral leg venous insufficiency wound. His periwound erythema is very mild. We have been using Hydrofera Blue. Wound is making progress especially in the superior 50% 05/05/17; he continues to have very gradual improvement in the left lateral venous insufficiency wound. Both in terms with an length rings are improving. I debrided this every 2 [name] with #5 curet and we have been using Hydrofera Blue and again making good progress With regards to the wounds between his right first and second toe which I thought might of been tinea pedis he is not making as much progress very dry scaly skin over the area. Also the area at the base of the left first and second toe in a similar condition 05/12/17; continued gradual improvement in the refractory left lateral venous insufficiency wound  on the left. Dimension smaller. Surface still requiring debridement using Hydrofera Blue 05/19/17; continued gradual improvement in the refractory left lateral venous ulceration. Careful inspection of the wound bed underlying rumination suggested some degree of epithelialization over the surface no debridement indicated. Continue Hydrofera Blue difficult areas between his toes first and third on the left than first and second on the right. I'm going to change to silver alginate from silver collagen. Continue ketoconazole as I suspect underlying tinea pedis 05/26/17; left lateral leg venous insufficiency wound. We've been using Hydrofera Blue. I believe that there is expanding epithelialization over the surface of the wound albeit not coming from the wound circumference. This is a bit of an odd situation in which the epithelialization seems to be coming from the surface of the wound rather than in the exact circumference. There is still small open areas mostly along the lateral margin of the wound. ooHe has unchanged areas between the left first and  second and the right first second toes which I been treating for tenia pedis 06/02/17; left lateral leg venous insufficiency wound. We have been using Hydrofera Blue. Somewhat smaller from the wound circumference. The surface of the wound remains a bit on it almost epithelialized sedation in appearance. I use an open curette today debridement in the surface of all of this especially the edges ooSmall open wounds remaining on the dorsal right first and second toe interspace and the plantar left first second toe and her face on the left 06/09/17; wound on the left lateral leg continues to be smaller but very gradual and very dry surface using Hydrofera Blue 06/16/17 requires weekly debridements now on the left lateral leg although this continues to contract. I changed to silver collagen last week because of dryness of the wound bed. Using Iodoflex to the areas on  his first and second toes/web space bilaterally 06/24/17; patient with history of paraplegia also chronic venous insufficiency with lymphedema. Has a very difficult wound on the left lateral leg. This has been gradually reducing in terms of with but comes in with a very dry adherent surface. High switch to silver collagen a week or so ago with hydrogel to keep the area moist. This is been refractory to multiple dressing attempts. He also has areas in his first and second toes bilaterally in the anterior and posterior web space. I had been using Iodoflex here after a prolonged course of silver alginate with ketoconazole was ineffective [question tinea pedis] 07/14/17; patient arrives today with a very difficult adherent material over his left lateral lower leg wound. He also has surrounding erythema and poorly controlled edema. He was switched his Santyl last visit which the nurses are applying once during his doctor visit and once on a nurse visit. He was also reduced to 2 layer compression I'm not exactly sure of the issue here. 07/21/17; better surface today after 1 week of Iodoflex. Significant cellulitis that we treated last week also better. [Doxycycline] 07/28/17 better surface today with now 2 [name] of Iodoflex. Significant cellulitis treated with doxycycline. He has now completed the doxycycline and he is back to his usual degree of chronic venous inflammation/stasis dermatitis. He reminds me he has had ablations surgery here 08/04/17; continued improvement with Iodoflex to the left lateral leg wound in terms of the surface of the wound although the dimensions are better. 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 Problematic areas on the plantar aspect of the first second toe web space on the left and the dorsal aspect of the first second toe web space on the right. At one point I felt these were probably related to chronic fungal  infections in treated him aggressively for this although we have not made any improvement here. 08/11/17; left lateral leg. Surface continues to improve with the Iodoflex although we are not seeing much improvement in overall wound dimensions. Areas on his plantar left foot and right foot show no improvement. In fact the right foot looks somewhat worse 08/18/17; left lateral leg. We changed to [facility] Blue last week after a prolonged course of Iodoflex which helps get the surface better. It appears that the wound with is improved. Continue with difficult areas on the left dorsal first second and plantar first second on the right 09/01/17; patient arrives in clinic today having had a temperature of 103 yesterday. He was seen in the ER and [facility]. The patient was concerned he  could have cellulitis again in the right leg however they diagnosed him with a UTI and he is now on Keflex. He has a history of cellulitis which is been recurrent and difficult but this is been in the left leg, in the past 5 use doxycycline. He does in and out catheterizations at home which are risk factors for UTI 09/08/17; patient will be completing his Keflex this weekend. The erythema on the left leg is considerably better. He has a new wound today on the medial part of the right leg small superficial almost looks like a skin tear. He has worsening of the area on the right dorsal first and second toe. His major area on the left lateral leg is better. Using Hydrofera Blue on all areas 09/15/17; gradual reduction in width on the long wound in the left lateral leg. No debridement required. He also has wounds on the plantar aspect of his left first second toe web space and on the dorsal aspect of the right first second toe web space. 09/22/17; there continues to be very gradual improvements in the dimensions of the left lateral leg wound. He hasn't round erythematous spot with might be pressure on his wheelchair. There is no  evidence obviously of infection no purulence no warmth ooHe has a dry scaled area on the plantar aspect of the left first second toe ooImproved area on the dorsal right first second toe. 09/29/17; left lateral leg wound continues to improve in dimensions mostly with an is still a fairly long but increasingly narrow wound. ooHe has a dry scaled area on the plantar aspect of his left first second toe web space ooIncreasingly concerning area on the dorsal right first second toe. In fact I am concerned today about possible cellulitis around this wound. The areas extending up his second toe and although there is deformities here almost appears to abut on the nailbed. 10/06/17; left lateral leg wound continues to make very gradual progress. Tissue culture I did from the right first second toe dorsal foot last time grew MRSA and enterococcus which was vancomycin sensitive. This was not sensitive to clindamycin or doxycycline. He is allergic to Zyvox and sulfa we have therefore arrange for him to have dalvance infusion tomorrow. He is had this in the past and tolerated it well 10/20/17; left lateral leg wound continues to make decent progress. This is certainly reduced in terms of with there is advancing epithelialization.ooThe cellulitis in the right foot looks better although he still has a deep wound in the dorsal aspect of the first second toe web space. Plantar left first toe web space on the left I think is making some progress 10/27/17; left lateral leg wound continues to make decent progress. Advancing epithelialization.using Hydrofera Blue ooThe right first second toe web space wound is better-looking using silver alginate ooImprovement in the left plantar first second toe web space. Again using silver alginate 11/03/17 left lateral leg wound continues to make decent progress albeit slowly. Using Hydrofera Blue ooThe right per second toe web space continues to be a very problematic looking  punched out wound. I obtained a piece of tissue for deep culture I did extensively treated this for fungus. It is difficult to imagine that this is a pressure area as the patient states other than going outside he doesn't really wear shoes at home ooThe left plantar first second toe web space looked fairly senescent. Necrotic edges. This required debridement oochange to Hydrofera Blue to all wound areas 11/10/17; left lateral  leg wound continues to contract. Using Hydrofera Blue ooOn the right dorsal first second toe web space dorsally. Culture I did of this area last week grew MRSA there is not an easy oral option in this patient was multiple antibiotic allergies or intolerances. This was only a rare culture isolate I'm therefore going to use Bactroban under silver alginate ooOn the left plantar first second toe web space. Debridement is required here. This is also unchanged 11/17/17; left lateral leg wound continues to contract using Hydrofera Blue this is no longer the major issue. ooThe major concern here is the right first second toe web space. He now has an open area going from dorsally to the plantar aspect. There is now wound on the inner lateral part of the first toe. Not a very viable surface on this. There is erythema spreading medially into the forefoot. ooNo major change in the left first second toe plantar wound 11/24/17; left lateral leg wound continues to contract using Hydrofera Blue. Nice improvement today ooThe right first second toe web space all of this looks a lot less angry than last week. I have given him clindamycin and topical Bactroban for MRSA and terbinafine for the possibility of underlining tinea pedis that I could not control with ketoconazole. Looks somewhat better ooThe area on the plantar left first second toe web space is weeping with dried debris around the wound 12/01/17; left lateral leg wound continues to contract he Hydrofera Blue. It is becoming thinner  in terms of with nevertheless it is making good improvement. ooThe right first second toe web space looks less angry but still a large necrotic-looking wounds starting on the plantar aspect of the right foot extending between the toes and now extensively on the base of the right second toe. I gave him clindamycin and topical Bactroban for MRSA anterior benefiting for the possibility of underlying tinea pedis. Not looking better today ooThe area on the left first/second toe looks better. Debrided of necrotic debris 12/05/17* the patient was worked in urgently today because over the weekend he found blood on his incontinence bad when he woke up. He was found to have an ulcer by his wife who does most of his wound care. He came in today for [place] to look at this. He has not had a history of wounds in his buttocks in spite of his paraplegia. 12/08/17; seen in follow-up today at his usual appointment. He was seen earlier this week and found to have a new wound on his buttock. We also follow him for wounds on the left lateral leg, left first second toe web space and right first second toe web space 12/15/17; we have been using Hydrofera Blue to the left lateral leg which has improved. The right first second toe web space has also improved. Left first second toe web space plantar aspect looks stable. The left buttock has worsened using Santyl. Apparently the buttock has drainage 12/22/17; we have been using Hydrofera Blue to the left lateral leg which continues to improve now 2 small wounds separated by normal skin. He tells [place] he had a fever up to 100 yesterday he is prone to UTIs but has not noted anything different. He does in and out catheterizations. The area between the first and second toes today does not look good necrotic surface covered with what looks to be purulent drainage and erythema extending into the third toe. I had gotten this to something that I thought look better last time however it is  not  look good today. He also has a necrotic surface over the buttock wound which is expanded. I thought there might be infection under here so I removed a lot of the surface with a #5 curet though nothing look like it really needed culturing. He is been using Santyl to this area 12/27/17; his original wound on the left lateral leg continues to improve using Hydrofera Blue. I gave him samples of Baxdella although he was unable to take them out of fear for an allergic reaction ["lump in his throat"].the culture I did of the purulent drainage from his second toe last week showed both enterococcus and a set Enterobacter I was also concerned about the erythema on the bottom of his foot although paradoxically although this looks somewhat better today. 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 We've been using silver alginate here. They came up today that he sleeps in a recliner, I'm not sure why but I asked him to stop this 01/03/18; his original wound we've been using Hydrofera Blue is now separated into 2 areas. ooUlcer on his left buttock is better he is off the recliner and sleeping in bed ooFinally both wound areas between his first and second toes also looks some better 01/10/18; his original wound on the left lateral leg is now separated into 2 wounds we've been using Hydrofera Blue ooUlcer on his left buttock has some drainage. There is a small probing site going into muscle layer superiorly.using silver alginate -He arrives today with a deep tissue injury on the left heel ooThe wound on the dorsal aspect of his first second toe on the left looks a lot betterusing silver alginate ketoconazole ooThe area on the first second toe web space on the right also looks a lot bette 01/17/18; his original wound on the left lateral leg continues to progress using Hydrofera Blue ooUlcer on his left buttock also is  smaller surface healthier except for a small probing site going into the muscle layer superiorly. 2.4 cm of tunneling in this area ooDTI on his left heel we have only been offloading. Looks better than last week no threatened open no evidence of infection oothe wound on the dorsal aspect of the first second toe on the left continues to look like it's regressing we have only been using silver alginate and terbinafine orally ooThe area in the first second toe web space on the right also looks to be a lot better using silver alginate and terbinafine I think this was prompted by tinea pedis 01/31/18; the patient was hospitalized in [place] last week apparently for a complicated UTI. He was discharged on cefepime he does in and out catheterizations. In the hospital he was discovered M I don't mild elevation of AST and ALT and the terbinafine was stopped.predictably the pressure ulcer on s his buttock looks betterusing silver alginate. The area on the left lateral leg also is better using Hydrofera Blue. The area between the first and second toes on the left better. First and second toes on the right still substantial but better. Finally the DTI on the left heel has held together and looks like it's resolving 02/07/18-he is here in follow-up evaluation for multiple ulcerations. He has new injury to the lateral aspect of the last issue a pressure ulcer, he states this is from adhesive removal trauma. He states he has tried multiple adhesive products with no success. All other  ulcers appear stable. The left heel DTI is resolving. We will continue with same treatment plan and follow-up next week. 02/14/18; follow-up for multiple areas. ooHe has a new area last week on the lateral aspect of his pressure ulcer more over the posterior trochanter. The original pressure ulcer looks quite stable has healthy granulation. 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 Almost fully epithelialized on the original superior area using Hydrofera Blue ooDTI on the left heel has peeled off this week to reveal a small superficial wound under denuded skin and subcutaneous tissue ooBoth areas between the first and second toes look better including nothing open on the left 02/21/18; ooThe patient's wounds on his left ischial tuberosity and posterior left greater trochanter actually looked better. He has a large area of irritation around the area which I think is contact dermatitis. I am doubtful that this is fungal ooHis original wound on the left lateral calf continues to improve we have been using Hydrofera Blue ooThere is no open area in the left first second toe web space although there is a lot of thick callus ooThe DTI on the left heel required debridement today of necrotic surface eschar and subcutaneous tissue using silver alginate ooFinally the area on the right first second toe webspace continues to contract using silver alginate and ketoconazole 02/28/18 ooLeft ischial tuberosity wounds look better using silver alginate. ooOriginal wound on the left calf only has one small open area left using Hydrofera Blue ooDTI on the left heel required debridement mostly removing skin from around this wound surface. Using silver alginate ooThe areas on the right first/second toe web space using silver alginate and ketoconazole 03/08/18 on evaluation today patient appears to be doing decently well as best I can tell in regard to his wounds. This is the first time that I have seen him as he generally is followed by Dr. Dellia Nims. With that being said none of his wounds appear to be infected he does have an area where there is some skin covering what appears to be a new wound on the left dorsal surface of his great toe. This is right at the nail bed. With that being said I do believe that debrided away some of the  excess skin can be of benefit in this regard. Otherwise he has been tolerating the dressing changes without complication. 03/14/18; patient arrives today with the multiplicity of wounds that we are following. He has not been systemically unwell ooOriginal wound on the left lateral calf now only has 2 small open areas we've been using Hydrofera Blue which should continue ooThe deep tissue injury on the left heel requires debridement today. We've been using silver alginate ooThe left first second toe and the right first second toe are both are reminiscence what I think was tinea pedis. Apparently some of the callus Surface between the toes was removed last week when it started draining. ooPurulent drainage coming from the wound on the ischial tuberosity on the left. 03/21/18-He is here in follow-up evaluation for multiple wounds. 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 He tolerated debridement. The only change to last week's recommendations is to discontinue antifungal cream between toes. He will follow-up next week 03/28/18; following up for multiple wounds;Concern this week is streaking redness and swelling in the right foot. He is going to need antibiotics for this. 03/31/18;  follow-up for right foot cellulitis. Streaking redness and swelling in the right foot on 03/28/18. 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 I put him on clindamycin 300 mg every 6 and brought him in for a quick check. He has an open wound between his first and second toes on the right foot as a potential source. 04/04/18; ooRight foot cellulitis is resolving he is completing clindamycin. This is truly good news ooLeft lateral calf wound which is initial wound only has one small open area inferiorly this is close to healing out. He has compression stockings. We will use Hydrofera Blue right down to the epithelialization of  this ooNonviable surface on the left heel which was initially pressure with a DTI. We've been using Hydrofera Blue. I'm going to switch this back to silver alginate ooLeft first second toe/tinea pedis this looks better using silver alginate ooRight first second toe tinea pedis using silver alginate ooLarge pressure ulcers on theLeft ischial tuberosity. Small wound here Looks better. I am uncertain about the surface over the large wound. Using silver alginate 04/11/18; ooCellulitis in the right foot is resolved ooLeft lateral calf wound which was his original wounds still has 2 tiny open areas remaining this is just about closed ooNonviable surface on the left heel is better but still requires debridement ooLeft first second toe/tinea pedis still open using silver alginate ooRight first second toe wound tinea pedis I asked him to go back to using ketoconazole and silver alginate ooLarge pressure ulcers on the left ischial tuberosity this shear injury here is resolved. Wound is smaller. No evidence of infection using silver alginate 04/18/18; ooPatient arrives with an intense area of cellulitis in the right mid lower calf extending into the right heel area. Bright red and warm. Smaller area on the left anterior leg. He has a significant history of MRSA. He will definitely need antibioticsoodoxycycline ooHe now has 2 open areas on the left ischial tuberosity the original large wound and now a satellite area which I think was above his initial satellite areas. Not a wonderful surface on this satellite area surrounding erythema which looks like pressure related. ooHis left lateral calf wound again his original wound is just about closed ooLeft heel pressure injury still requiring debridement ooLeft first second toe looks a lot better using silver alginate ooRight first second toe also using silver alginate and ketoconazole cream also looks better 04/20/18; the patient was worked in  early today out of concerns with his cellulitis on the right leg. I had started him on doxycycline. This was 2 days ago. His wife was concerned about the swelling in the area. Also concerned about the left buttock. He has not been systemically unwell no fever chills. No nausea vomiting or diarrhea 04/25/18; the patient's left buttock wound is continued to deteriorate he is using Hydrofera Blue. He is still completing clindamycin for the cellulitis on the right leg although all of this looks better. 05/02/18 ooLeft buttock wound still with a lot of drainage and a very tightly adherent fibrinous necrotic surface. He has a deeper area superiorly ooThe left lateral calf wound is still closed ooDTI wound on the left heel necrotic surface especially the circumference using Iodoflex ooAreas between his left first second toe and right first second toe both look better. Dorsally and the right first second toe he had a necrotic surface although at smaller. In using silver alginate and ketoconazole. I did a culture last week which was a deep tissue culture of  the reminiscence of the open wound on the right first second toe dorsally. This grew a few Acinetobacter and a few methicillin-resistant staph aureus. Nevertheless the area actually this week looked better. I didn't feel the need to specifically address this at least in terms of systemic antibiotics. 05/09/18; wounds are measuring larger more drainage per our intake. We are using Santyl covered with alginate on the large superficial buttock wounds, Iodosorb on the left heel, ketoconazole and silver alginate to the dorsal first and second toes bilaterally. 05/16/18; ooThe area on his left buttock better in some aspects although the area superiorly over the ischial tuberosity required an extensive debridement.using Santyl ooLeft heel appears stable. Using Iodoflex ooThe areas between his first and second toes are not bad however there is spreading  erythema up the dorsal aspect of his left foot this looks like cellulitis again. He is insensate the erythema is really very brilliant.o Erysipelas He went to see an allergist days ago because he was itching part of this he had lab work done. This showed a white count of 15.1 with 70% neutrophils. Hemoglobin of 11.4 and a platelet count of 659,000. Last white count we had in Epic was a 2-1/2 years ago which was 25.9 but he was ill at the time. He was able to show me some lab work that was done by his primary physician the pattern is about the same. 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 But prompted me to go ahead and do x-rays of both feet and the pelvis rule out osteomyelitis. He also had a comprehensive metabolic panel this was reasonably normal his albumin was 3.7 liver function tests BUN/creatinine all normal 05/23/18; x-rays of both his feet from last week were negative for underlying pulmonary abnormality. The x-ray of his pelvis however showed mild irregularity in the left ischial which may represent some early osteomyelitis. The wound in the left ischial continues to get deeper clearly now exposed muscle. Each week necrotic surface material over this area. Whereas the rest of the wounds do not look so bad. ooThe left ischial wound we have been using Santyl and calcium alginate ooT the left heel surface necrotic debris using Iodoflex o ooThe left lateral leg is still healed ooAreas on the left dorsal foot and the right dorsal foot are about the same. There is some inflammation on the left which might represent contact dermatitis, fungal dermatitis I am doubtful cellulitis although this looks better than last week 05/30/18; CT scan done at Hospital did not show any osteomyelitis or abscess. Suggested the possibility of underlying cellulitis although I don't see a lot of evidence of this at the bedside ooThe wound itself on the left  buttock/upper thigh actually looks somewhat better. No debridement ooLeft heel also looks better no debridement continue Iodoflex ooBoth dorsal first second toe spaces appear better using Lotrisone. Left still required debridement 06/06/18; ooIntake reported some purulent looking drainage from the left gluteal wound. Using Santyl and calcium alginate ooLeft heel looks better although still a nonviable surface requiring debridement ooThe left dorsal foot first/second webspace actually expanding and somewhat deeper. I may consider doing a shave biopsy of this area ooRight dorsal foot first/second webspace appears stable to improved. Using Lotrisone and silver alginate to both these areas 06/13/18 ooLeft gluteal surface looks better. Now separated in the 2 wounds. No debridement required. Still drainage. We'll continue silver alginate ooLeft heel continues to look better with Iodoflex continue this  for at least another week ooOf his dorsal foot wounds the area on the left still has some depth although it looks better than last week. We've been using Lotrisone and silver alginate 06/20/18 ooLeft gluteal continues to look better healthy tissue ooLeft heel continues to look better healthy granulation wound is smaller. He is using Iodoflex and his long as this continues continue the Iodoflex ooDorsal right foot looks better unfortunately dorsal left foot does not. There is swelling and erythema of his forefoot. He had minor trauma to this several days ago but doesn't think this was enough to have caused any tissue injury. Foot looks like cellulitis, we have had this problem before 06/27/18 on evaluation today patient appears to be doing a little worse in regard to his foot ulcer. 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 Both of which would really be his  only options for treating this infection. In the past he has been given and effusion of Orbactiv. This is done very well for him in the past again it's one time dosing IV antibiotic therapy. Subsequently I do believe this is something we're gonna need to see about doing at this point in time. Currently his other wounds seem to be doing somewhat better in my pinion I'm pretty happy in that regard. 07/03/18 on evaluation today patient's wounds actually appear to be doing fairly well. He has been tolerating the dressing changes without complication. All in all he seems to be showing signs of improvement. In regard to the antibiotics he has been dealing with infectious disease since I saw him last week as far as getting this scheduled. In the end [facility]'s [facility] this coming Friday. In the meantime he has been continuing to perform the dressing changes in such as previous. There does not appear to be any evidence of infection worsengin at this time. 07/10/18; ooSince I last saw this man 2 [name] ago things have actually improved. IV antibiotics of resulted in less forefoot erythema although there is still some present. He is not systemically unwell ooLeft buttock wounds o2 now have no depth there is increased epithelialization Using silver alginate ooLeft heel still requires debridement using Iodoflex ooLeft dorsal foot still with a sizable wound about the size of a border but healthy granulation ooRight dorsal foot still with a slitlike area using silver alginate 07/18/18; the patient's cellulitis in the left foot is improved in fact I think it is on its way to resolving. ooLeft buttock wounds o2 both look better although the larger one has hypertension granulation we've been using silver alginate ooLeft heel has some thick circumferential redundant skin over the wound edge which will need to be removed today we've been using  Iodoflex ooLeft dorsal foot is still a sizable wound required debridement using silver alginate ooThe right dorsal foot is just about closed only a small open area remains here 07/25/18; left foot cellulitis is resolved ooLeft buttock wounds o2 both look better. Hyper-granulation on the major area ooLeft heel as some debris over the surface but otherwise looks a healthier wound. Using silver collagen ooRight dorsal foot is just about closed 07/31/18; arrives with our intake nurse worried about purulent drainage from the buttock. We had hyper-granulation here last week ooHis buttock wounds o2 continue to look better ooLeft heel  some debris over the surface but measuring smaller. ooRight dorsal foot unfortunately has openings between the toes ooLeft foot superficial wound looks less aggravated. 08/07/18 ooButtock wounds continue to look better although some of her granulation and the larger medial wound. silver alginate ooLeft heel continues to look a lot better.silver collagen ooLeft foot superficial wound looks less stable. Requires debridement. He has a new wound superficial area on the foot on the lateral dorsal foot. ooRight foot looks better using silver alginate without Lotrisone 08/14/2018; patient was in the ER last week diagnosed with a UTI. He is now on Cefpodoxime and Macrodantin. ooButtock wounds continued to be smaller. Using silver alginate ooLeft heel continues to look better using silver collagen ooLeft foot superficial wound looks as though it is improving ooRight dorsal foot area is just about healed. 08/21/2018; patient is completed his antibiotics for his UTI. ooHe has 2 open areas on the buttocks. There is still not closed although the surface looks satisfactory. Using silver alginate ooLeft heel continues to improve using silver collagen ooThe bilateral dorsal foot areas which are at the base of his first and second toes/possible tinea pedis are actually  stable on the left but worse on the right. The area on the left required debridement of necrotic surface. After debridement I obtained a specimen for PCR culture. ooThe right dorsal foot which is been just about healed last week is now reopened 08/28/2018; culture done on the left dorsal foot showed coag negative staph both staph epidermidis and Lugdunensis. I think this is worthwhile initiating systemic treatment. I will use doxycycline given his long list of allergies. The area on the left heel slightly improved but still requiring debridement. ooThe large wound on the buttock is just about closed whereas the smaller one is larger. Using silver alginate in this area 09/04/2018; patient is completing his doxycycline for the left foot although this continues to be a very difficult wound area with very adherent necrotic debris. We are using silver alginate to all his wounds right foot left foot and the small wounds on his buttock, silver collagen on the left heel. 09/11/2018; once again this patient has intense erythema and swelling of the left forefoot. Lesser degrees of erythema in the right foot. He has a long list of allergies and intolerances. I will reinstitute doxycycline. 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 Using silver alginate ooLeft heel also looks better using silver collagen ooUnfortunately both the areas on his feet look worse. The area on the left first second webspace is now gone through to the plantar part of his foot. The area on the left foot anteriorly is irritated with erythema and swelling in the forefoot. 09/25/2018 ooHis wound on the left plantar heel looks better. Using silver collagen ooThe area on the left buttock 2 small remnant areas. One is closed one is still open. Using silver alginate ooThe areas between both his first and second toes look worse. This in spite of long-standing antifungal therapy with  ketoconazole and silver alginate which should have antifungal activity ooHe has small areas around his original wound on the left calf one is on the bottom of the original scar tissue and one superiorly both of these are small and superficial but again given wound history in this site this is worrisome 10/02/2018 ooLeft plantar heel continues to gradually contract using silver collagen ooLeft buttock wound is unchanged using silver alginate ooThe areas on his dorsal feet  between his first and second toes bilaterally look about the same. 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 Small open area here. We have been using silver alginate 10/09/2018; ooLeft plantar heel continues to look healthy and contract. No debridement is required ooLeft buttock slightly smaller with a tape injury wound just below which was new this week ooDorsal feet somewhat improved I have been using clindamycin ooLeft lateral looks lower extremity the actual open area looks worse although a lot of this is epithelialized. I am going to change to silver collagen today He has a lot more swelling in the right leg although this is not pitting not red and not particularly warm there is a lot of spasm in the right leg usually indicative of people with paralysis of some underlying discomfort. We have reviewed his vascular status from 2017 he had a left greater saphenous vein ablation. I wonder about referring him back to vascular surgery if the area on the left leg continues to deteriorate. 10/16/2018 in today for follow-up and management of multiple lower extremity ulcers. His left Buttock wound is much lower smaller and almost closed completely. The wound to the left ankle has began to reopen with Epithelialization and some adherent slough. He  has multiple new areas to the left foot and leg. The left dorsal foot without much improvement. Wound present between left great webspace and 2nd toe. Erythema and edema present right leg. Right LE ultrasound obtained on 10/10/18 was negative for DVT . 10/23/2018; ooLeft buttock is closed over. Still dry macerated skin but there is no open wound. I suspect this is chronic pressure/moisture ooLeft lateral calf is quite a bit worse than when I saw this last. There is clearly drainage here he has macerated skin into the left plantar heel. We will change the primary dressing to alginate ooLeft dorsal foot has some improvement in overall wound area. Still using clindamycin and silver alginate ooRight dorsal foot about the same as the left using clindamycin and silver alginate ooThe erythema in the right leg has resolved. He is DVT rule out was negative ooLeft heel pressure area required debridement although the wound is smaller and the surface is health 10/26/2018 ooThe patient came back in for his nurse check today predominantly because of the drainage coming out of the left lateral leg with a recent reopening of his original wound on the left lateral calf. He comes in today with a large amount of surrounding erythema around the wound extending from the calf into the ankle and even in the area on the dorsal foot. He is not systemically unwell. He is not febrile. Nevertheless this looks like cellulitis. We have been using silver alginate to the area. I changed him to a regular visit and I am going to prescribe him doxycycline. The rationale here is a long list of medication intolerances and a history of MRSA. I did not see anything that I thought would provide a valuable culture 10/30/2018 ooFollow-up from his appointment 4 days ago with really an extensive area of cellulitis in the left calf left lateral ankle and left dorsal foot. I put him on doxycycline. He has a long list of medication  allergies which are true allergy reactions. 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 In any case  he is a lot better today. The erythema especially in the anterior and lateral left calf is better. He still has left ankle erythema. He also is complaining about increasing edema in the right leg we have only been using Kerlix Coban and he has been doing the wraps at home. Finally he has a spotty rash on the medial part of his upper left calf which looks like folliculitis or perhaps wrap occlusion type injury. Small superficial macules not pustules 11/06/18 patient arrives today with again a considerable degree of erythema around the wound on the left lateral calf extending into the dorsal ankle and dorsal foot. This is a lot worse than when I saw this last week. He is on doxycycline really with not a lot of improvement. He has not been systemically unwell Wounds on the; left heel actually looks improved. Original area on the left foot and proximity to the first and second toes looks about the same. He has superficial areas on the dorsal foot, anterior calf and then the reopening of his original wound on the left lateral calf which looks about the same ooThe only area he has on the right is the dorsal webspace first and second which is smaller. ooHe has a large area of dry erythematous skin on the left buttock small open area here. 11/13/2018; the patient arrives in much better condition. The erythema around the wound on the left lateral calf is a lot better. Not sure whether this was the clindamycin or the TCA and ketoconazole or just in the improvement in edema control [stasis dermatitis]. In any case this is a lot better. The area on the left heel is very small and just about resolved using silver collagen we have been using silver alginate to the areas on his dorsal feet 11/20/2018; his wounds include the left lateral calf, left  heel, dorsal aspects of both feet just proximal to the first second webspace. He is stable to slightly improved. I did not think any changes to his dressings were going to be necessary 11/27/2018 he has a reopening on the left buttock which is surrounded by what looks like tinea or perhaps some other form of dermatitis. The area on the left dorsal foot has some erythema around it I have marked this area but I am not sure whether this is cellulitis or not. Left heel is not closed. Left calf the reopening is really slightly longer and probably worse 1/13; in general things look better and smaller except for the left dorsal foot. Area on the left heel is just about closed, left buttock looks better only a small wound remains in the skin looks better [using Lotrisone] 1/20; the area on the left heel only has a few remaining open areas here. Left lateral calf about the same in terms of size, left dorsal foot slightly larger right lateral foot still not closed. The area on the left buttock has no open wound and the surrounding skin looks a lot better 1/27; the area on the left heel is closed. Left lateral calf better but still requiring extensive debridements. The area on his left buttock is closed. He still has the open areas on the left dorsal foot which is slightly smaller in the right foot which is slightly expanded. We have been using Iodoflex on these areas as well 2/3; left heel is closed. Left lateral calf still requiring debridement using Iodoflex there is no open area on his left buttock however he has dry scaly skin over a  large area of this. Not really responding well to the Lotrisone. Finally the areas on his dorsal feet at the level of the first second webspace are slightly smaller on the right and about the same on the left. Both of these vigorously debrided with Anasept and gauze 2/10; left heel remains closed he has dry erythematous skin over the left buttock but there is no open wound here.  Left lateral leg has come in and with. Still requiring debridement we have been using Iodoflex here. Finally the area on the left dorsal foot and right dorsal foot are really about the same extremely dry callused fissured areas. He does not yet have a dermatology appointment 2/17; left heel remains closed. He has a new open area on the left buttock. The area on the left lateral calf is bigger longer and still covered in necrotic debris. No major change in his foot areas bilaterally. I am awaiting for a dermatologist to look on this. We have been using ketoconazole I do not know that this is been doing any good at all. 2/24; left heel remains closed. The left buttock wound that was new reopening last week looks better. The left lateral calf appears better also although still requires debridement. The major area on his foot is the left first second also requiring debridement. We have been putting Prisma on all wounds. I do not believe that the ketoconazole has done too much good for his feet. He will use Lotrisone I am going to give him a 2-week course of terbinafine. We still do not have a dermatology appointment 3/2 left heel remains closed however there is skin over bone in this area I pointed this out to him today. The left buttock wound is epithelialized but still does not look completely stable. The area on the left leg required debridement were using silver collagen here. With regards to his feet we changed to Lotrisone last week and silver alginate. 3/9; left heel remains closed. Left buttock remains closed. The area on the right foot is essentially closed. The left foot remains unchanged. Slightly smaller on the left lateral calf. Using silver collagen to both of these areas 3/16-Left heel remains closed. Area on right foot is closed. Left lateral calf above the lateral malleolus open wound requiring debridement with easy bleeding. Left dorsal wound proximal to first toe also debrided. Left  ischial area open new. Patient has been using Prisma with wrapping every 3 days. Dermatology appointment is apparently tomorrow.Patient has completed his terbinafine 2-week course with some apparent improvement according to him, there is still flaking and dry skin in his foot on the left 3/23; area on the right foot is reopened. The area on the left anterior foot is about the same still a very necrotic adherent surface. He still has the area on the left leg and reopening is on the left buttock. He apparently saw dermatology although I do not have a note. According to the patient who is usually fairly well informed they did not have any good ideas. Put him on oral terbinafine which she is been on before. 3/30; using silver collagen to all wounds. Apparently his dermatologist put him on doxycycline and rifampin presumably some culture grew staph. I do not have this result. He remains on terbinafine although I have used terbinafine on him before 4/6; patient has had a fairly substantial reopening on the right foot between the first and second toes. He is finished his terbinafine and I believe is on doxycycline and  rifampin still as prescribed by dermatology. We have been using silver collagen to all his wounds although the patient reports that he thinks silver alginate does better on the wounds on his buttock. 4/13; the area on his left lateral calf about the same size but it did not require debridement. ooLeft dorsal foot just proximal to the webspace between the first and second toes is about the same. Still nonviable surface. I note some superficial bronze discoloration of the dorsal part of his foot ooRight dorsal foot just proximal to the first and second toes also looks about the same. I still think there may be the same discoloration I noted above on the left ooLeft buttock wound looks about the same 4/20; left lateral calf appears to be gradually contracting using silver collagen. ooHe  remains on erythromycin empiric treatment for possible erythrasma involving his digital spaces. The left dorsal foot wound is debrided of tightly adherent necrotic debris and really cleans up quite nicely. The right area is worse with expansion. I did not debride this it is now over the base of the second toe ooThe area on his left buttock is smaller no debridement is required using silver collagen 5/4; left calf continues to make good progress. ooHe arrives with erythema around the wounds on his dorsal foot which even extends to the plantar aspect. Very concerning for coexistent infection. He is finished the erythromycin I gave him for possible erythrasma this does not seem to have helped. ooThe area on the left foot is about the same base of the dorsal toes ooIs area on the buttock looks improved on the left 5/11; left calf and left buttock continued to make good progress. Left foot is about the same to slightly improved. ooMajor problem is on the right foot. He has not had an x-ray. Deep tissue culture I did last week showed both Enterobacter and E. coli. I did not change the doxycycline I put him on empirically although neither 1 of these were plated to doxycycline. He arrives today with the erythema looking worse on both the dorsal and plantar foot. Macerated skin on the bottom of the foot. he has not been systemically unwell 5/18-Patient returns at 1 week, left calf wound appears to be making some progress, left buttock wound appears slightly worse than last time, left foot wound looks slightly better, right foot redness is marginally better. X-ray of both feet show no air or evidence of osteomyelitis. Patient is finished his Omnicef and terbinafine. He continues to have macerated skin on the bottom of the left foot as well as right 5/26; left calf wound is better, left buttock wound appears to have multiple small superficial open areas with surrounding macerated skin. X-rays that I did  last time showed no evidence of osteomyelitis in either foot. He is finished cefdinir and doxycycline. I do not think that he was on terbinafine. He continues to have a large superficial open area on the right foot anterior dorsal and slightly between the first and second toes. I did send him to dermatology 2 months ago or so wondering about whether they would do a fungal scraping. I do not believe they did but did do a culture. We have been using silver alginate to the toe areas, he has been using antifungals at home topically either ketoconazole or Lotrisone. We are using silver collagen on the left foot, silver alginate on the right, silver collagen on the left lateral leg and silver alginate on the left buttock 6/1; left  buttock area is healed. We have the left dorsal foot, left lateral leg and right dorsal foot. We are using silver alginate to the areas on both feet and silver collagen to the area on his left lateral calf 6/8; the left buttock apparently reopened late last week. He is not really sure how this happened. He is tolerating the terbinafine. Using silver alginate to all wounds 6/15; left buttock wound is larger than last week but still superficial. ooCame in the clinic today with a report of purulence from the left lateral leg I did not identify any infection ooBoth areas on his dorsal feet appear to be better. He is tolerating the terbinafine. Using silver alginate to all wounds 6/22; left buttock is about the same this week, left calf quite a bit better. His left foot is about the same however he comes in with erythema and warmth in the right forefoot once again. Culture that I gave him in the beginning of May showed Enterobacter and E. coli. I gave him doxycycline and things seem to improve although neither 1 of these organisms was specifically plated. 6/29; left buttock is larger and dry this week. Left lateral calf looks to me to be improved. Left dorsal foot also somewhat  improved right foot completely unchanged. The erythema on the right foot is still present. He is completing the Ceftin dinner that I gave him empirically [see discussion above.) 7/6 - All wounds look to be stable and perhaps improved, the left buttock wound is slightly smaller, per patient bleeds easily, completed ceftin, the right foot redness is less, he is on terbinafine 7/13; left buttock wound about the same perhaps slightly narrower. Area on the left lateral leg continues to narrow. Left dorsal foot slightly smaller right foot about the same. We are using silver alginate on the right foot and Hydrofera Blue to the areas on the left. Unna boot on the left 2 layer compression on the right 7/20; left buttock wound absolutely the same. Area on lateral leg continues to get better. Left dorsal foot require debridement as did the right no major change in the 7/27; left buttock wound the same size necrotic debris over the surface. The area on the lateral leg is closed once again. His left foot looks better right foot about the same although there is some involvement now of the posterior first second toe area. He is still on terbinafine which I have given him for a month, not certain a centimeter major change 06/25/19-All wounds appear to be slightly improved according to report, left buttock wound looks clean, both foot wounds have minimal to no debris the right dorsal foot has minimal slough. We are using Hydrofera Blue to the left and silver alginate to the right foot and ischial wound. 8/10-Wounds all appear to be around the same, the right forefoot distal part has some redness which was not there before, however the wound looks clean and small. Ischial wound looks about the same with no changes 8/17; his wound on the left lateral calf which was his original chronic venous insufficiency wound remains closed. Since I last saw him the areas on the left dorsal foot right dorsal foot generally appear  better but require debridement. The area on his left initial tuberosity appears somewhat larger to me perhaps hyper granulated and bleeds very easily. We have been using Hydrofera Blue to the left dorsal foot and silver alginate to everything else 8/24; left lateral calf remains closed. The areas on his dorsal feet  on the webspace of the first and second toes bilaterally both look better. The area on the left buttock which is the pressure ulcer stage II slightly smaller. I change the dressing to Hydrofera Blue to all areas 8/31; left lateral calf remains closed. The area on his dorsal feet bilaterally look better. Using Hydrofera Blue. Still requiring debridement on the left foot. No change in the left buttock pressure ulcers however 9/14; left lateral calf remains closed. Dorsal feet look quite a bit better than 2 [name] ago. Flaking dry skin also a lot better with the ammonium lactate I gave him 2 [name] ago. The area on the left buttock is improved. He states that his Roho cushion developed a leak and he is getting a new one, in the interim he is offloading this vigorously 9/21; left calf remains closed. Left heel which was a possible DTI looks better this week. He had macerated tissue around the left dorsal foot right foot looks satisfactory and improved left buttock wound. I changed his dressings to his feet to silver alginate bilaterally. Continuing Hydrofera Blue on the left buttock. 9/28 left calf remains closed. Left heel did not develop anything [possible DTI] dry flaking skin on the left dorsal foot. Right foot looks satisfactory. Improved left buttock wound. We are using silver alginate on his feet Hydrofera Blue on the buttock. I have asked him to go back to the Lotrisone on his feet including the wounds and surrounding areas 10/5; left calf remains closed. The areas on the left and right feet about the same. A lot of this is epithelialized however debris over the remaining open areas. He  is using Lotrisone and silver alginate. The area on the left buttock using Hydrofera Blue 10/26. Patient has been out for 3 [name] secondary to Covid concerns. He tested negative but I think his wife tested positive. He comes in today with the left foot substantially worse, right foot about the same. Even more concerning he states that the area on his left buttock closed over but then reopened and is considerably deeper in one aspect than it was before [stage III wound] 11/2; left foot really about the same as last week. Quarter sized wound on the dorsal foot just proximal to the first second toes. Surrounding erythema with areas of denuded epithelium. This is not really much different looking. Did not look like cellulitis this time however. ooRight foot area about the same.. We have been using silver alginate alginate on his toes ooLeft buttock still substantial irritated skin around the wound which I think looks somewhat better. We have been using Hydrofera Blue here. 11/9; left foot larger than last week and a very necrotic surface. Right foot I think is about the same perhaps slightly smaller. Debris around the circumference also addressed. Unfortunately on the left buttock there is been a decline. Satellite lesions below the major wound distally and now a an additional one posteriorly we have been using Hydrofera Blue but I think this is a pressure issue 11/16; left foot ulcer dorsally again a very adherent necrotic surface. Right foot is about the same. Not much change in the pressure ulcer on his left buttock. 11/30; left foot ulcer dorsally basically the same as when I saw him 2 [name] ago. Very adherent fibrinous debris on the wound surface. Patient reports a lot of drainage as well. 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 We have been using Iodoflex, patient changed back to alginate  because of the drainage. Area on his right dorsal foot  really looks benign with a healthier surface certainly a lot better than on the left. Left buttock wounds all improved using Hydrofera Blue 12/7; left dorsal foot again no improvement. Tightly adherent debris. PCR culture I did last week only showed likely skin contaminant. I have gone ahead and done a punch biopsy of this which is about the last thing in terms of investigations I can think to do. He has known venous insufficiency and venous hypertension and this could be the issue here. The area on the right foot is about the same left buttock slightly worse according to our intake nurse secondary to [facility] Blue sticking to the wound 12/14; biopsy of the left foot that I did last time showed changes that could be related to wound healing/chronic stasis dermatitis phenomenon no neoplasm. We have been using silver alginate to both feet. I change the one on the left today to Sorbact and silver alginate to his other 2 wounds 12/28; the patient arrives with the following problems; ooMajor issue is the dorsal left foot which continues to be a larger deeper wound area. Still with a completely nonviable surface ooParadoxically the area mirror image on the right on the right dorsal foot appears to be getting better. ooHe had some loss of dry denuded skin from the lower part of his original wound on the left lateral calf. Some of this area looked a little vulnerable and for this reason we put him in wrap that on this side this week ooThe area on his left buttock is larger. He still has the erythematous circular area which I think is a combination of pressure, sweat. This does not look like cellulitis or fungal dermatitis 11/26/2019; -Dorsal left foot large open wound with depth. Still debris over the surface. Using Sorbact ooThe area on the dorsal right foot paradoxically has closed over [facility] has a reopening on the left ankle laterally at the base of his original wound that extended up into the calf.  This appears clean. ooThe left buttock wound is smaller but with very adherent necrotic debris over the surface. We have been using silver alginate here as well The patient had arterial studies done in 2017. He had biphasic waveforms at the dorsalis pedis and posterior tibial bilaterally. ABI in the left was 1.17. Digit waveforms were dampened. He has slight spasticity in the great toes I do not think a TBI would be possible 1/11; the patient comes in today with a sizable reopening between the first and second toes on the right. This is not exactly in the same location where we have been treating wounds previously. According to our intake nurse this was actually fairly deep but 0.6 cm. The area on the left dorsal foot looks about the same the surface is somewhat cleaner using Sorbact, his MRI is in 2 days. We have not managed yet to get arterial studies. The new reopening on the left lateral calf looks somewhat better using alginate. The left buttock wound is about the same using alginate 1/18; the patient had his ARTERIAL studies which were quite normal. ABI in the right at 1.13 with triphasic/biphasic waveforms on the left ABI 1.06 again with triphasic/biphasic waveforms. It would not have been possible to have done a toe brachial index because of spasticity. We have been using Sorbac to the left foot alginate to the rest of his wounds on the right foot left lateral calf and left buttock 1/25; arrives  in clinic with erythema and swelling of the left forefoot worse over the first MTP area. This extends laterally dorsally and but also posteriorly. Still has an area on the left lateral part of the lower part of his calf wound it is eschared and clearly not closed. ooArea on the left buttock still with surrounding irritation and erythema. ooRight foot surface wound dorsally. The area between the right and first and second toes appears better. 2/1; ooThe left foot wound is about the same. Erythema  slightly better I gave him a week of doxycycline empirically ooRight foot wound is more extensive extending between the toes to the plantar surface ooLeft lateral calf really no open surface on the inferior part of his original wound however the entire area still looks vulnerable ooAbsolutely no improvement in the left buttock wound required debridement. 2/8; the left foot is about the same. Erythema is slightly improved I gave him clindamycin last week. ooRight foot looks better he is using Lotrimin and silver alginate ooHe has a breakdown in the left lateral calf. Denuded epithelium which I have removed ooLeft buttock about the same were using Hydrofera Blue 2/15; left foot is about the same there is less surrounding erythema. Surface still has tightly adherent debris which I have debriding however not making any progress ooRight foot has a substantial wound on the medial right second toe between the first and second webspace. ooStill an open area on the left lateral calf distal area. ooButtock wound is about the same 2/22; left foot is about the same less surrounding erythema. Surface has adherent debris. Polymen Ag Right foot area significant wound between the first and second toes. We have been using silver alginate here Left lateral leg polymen Ag at the base of his original venous insufficiency wound ooLeft buttock some improvement here 3/1; ooRight foot is deteriorating in the first second toe webspace. Larger and more substantial. We have been using silver alginate. ooLeft dorsal foot about the same markedly adherent surface debris using PolyMem Ag ooLeft lateral calf surface debris using PolyMem AG ooLeft buttock is improved again using PolyMem Ag. ooHe is completing his terbinafine. The erythema in the foot seems better. He has been on this for 2 [name] 3/8; no improvement in any wound area in fact he has a small open area on the dorsal midfoot which is new this week.  He has not gotten his foot x-rays yet 3/15; his x-rays were both negative for osteomyelitis of both feet. No major change in any of his wounds on the extremities however his buttock wounds are better. We have been using polymen on the buttocks, left lower leg. Iodoflex on the left foot and silver alginate on the right 3/22; arrives in clinic today with the 2 major issues are the improvement in the left dorsal foot wound which for once actually looks healthy with a nice healthy wound surface without debridement. Using Iodoflex here. Unfortunately on the left lateral calf which is in the distal part of his original wound he came to the clinic here for there was purulent drainage noted some increased breakdown scattered around the original area and a small area proximally. We we are using polymen here will change to silver alginate today. His buttock wound on the left is better and I think the area on the right first second toe webspace is also improved 3/29; left dorsal foot looks better. Using Iodoflex. Left ankle culture from deterioration last time grew E. coli, Enterobacter and Enterococcus. I will give  him a course of cefdinir although that will not cover Enterococcus. The area on the right foot in the webspace of the first and second toe lateral first toe looks better. The area on his buttock is about healed Vascular appointment is on April 21. This is to look at his venous system vis--vis continued breakdown of the wounds on the left including the left lateral leg and left dorsal foot he. He has had previous ablations on this side 4/5; the area between the right first and second toes lateral aspect of the first toe looks better. Dorsal aspect of the left first toe on the left foot also improved. Unfortunately the left lateral lower leg is larger and there is a second satellite wound superiorly. The usual superficial abrasions on the left buttock overall better but certainly not closed 4/12; the  area between the right first and second toes is improved. Dorsal aspect of the left foot also slightly smaller with a vibrant healthy looking surface. No real change in the left lateral leg and the left buttock wound is healed He has an unaffordable co-pay for Apligraf. Appointment with vein and vascular with regards to the left leg venous part of the circulation is on 4/21 4/19; we continue to see improvement in all wound areas. Although this is minor. He has his vascular appointment on 4/21. The area on the left buttock has not reopened although right in the center of this area the skin looks somewhat threatened 4/26; the left buttock is unfortunately reopened. In general his left dorsal foot has a healthy surface and looks somewhat smaller although it was not measured as such. The area between his first and second toe webspace on the right as a small wound against the first toe. The patient saw vascular surgery. The real question I was asking was about the small saphenous vein on the left. He has previously ablated left greater saphenous vein. Nothing further was commented on on the left. Right greater saphenous vein without reflux at the saphenofemoral junction or proximal thigh there was no indication for ablation of the right greater saphenous vein duplex was negative for DVT bilaterally. They did not think there was anything from a vascular surgery point of view that could be offered. They ABIs within normal limits 5/3; only small open area on the left buttock. The area on the left lateral leg which was his original venous reflux is now 2 wounds both which look clean. We are using Iodoflex on the left dorsal foot which looks healthy and smaller. He is down to a very tiny area between the right first and second toes, using silver alginate 5/10; all of his wounds appear better. We have much better edema control in 4 layer compression on the left. This may be the factor that is allowing the  left foot and left lateral calf to heal. He has external compression garments at home 04/14/20-All of his wounds are progressing well, the left forefoot is practically closed, left ischium appears to be about the same, right toe webspace is also smaller. The left lateral leg is about the same, continue using Hydrofera Blue to this, silver alginate to the ischium, Iodoflex to the toe space on the right 6/7; most of his wounds outside of the left buttock are doing well. The area on the left lateral calf and left dorsal foot are smaller. The area on the right foot in between the first and second toe webspace is barely visible although he still says there  is some drainage here is the only reason I did not heal this out. ooUnfortunately the area on the left buttock almost looks like he has a skin tear from tape. He has open wound and then a large flap of skin that we are trying to get adherence over an area just next to the remaining wound 6/21; 2 week follow-up. I believe is been here for nurse visits. Miraculously the area between his first and second toes on the left dorsal foot is closed over. Still open on the right first second web space. The left lateral calf has 2 open areas. Distally this is more superficial. The proximal area had a little more depth and required debridement of adherent necrotic material. His buttock wound is actually larger we have been using silver alginate here 6/28; the patient's area on the left foot remains closed. Still open wet area between the first and second toes on the right and also extending into the plantar aspect. We have been using silver alginate in this location. He has 2 areas on the left lower leg part of his original long wounds which I think are better. We have been using Hydrofera Blue here. Hydrofera Blue to the left buttock which is stable 7/12; left foot remains closed. Left ankle is closed. May be a small area between his right first and second toes the  only truly open area is on the left buttock. We have been using Hydrofera Blue here 7/19; patient arrives with marked deterioration especially in the left foot and ankle. We did not put him in a compression wrap on the left last week in fact he wore his juxta lite stockings on either side although he does not have an underlying stocking. He has a reopening on the left dorsal foot, left lateral ankle and a new area on the right dorsal ankle. More worrisome is the degree of erythema on the left foot extending on the lateral foot into the lateral lower leg on the left 7/26; the patient had erythema and drainage from the lateral left ankle last week. Culture of this grew MRSA resistant to doxycycline and clindamycin which are the 2 antibiotics we usually use with this patient who has multiple antibiotic allergies including linezolid, trimethoprim sulfamethoxazole. I had give him an empiric doxycycline and he comes in the area certainly looks somewhat better although it is blotchy in his lower leg. He has not been systemically unwell. He has had areas on the left dorsal foot which is a reopening, chronic wounds on the left lateral ankle. Both of these I think are secondary to chronic venous insufficiency. The area between his first and second toes is closed as far as I can tell. He had a new wrap injury on the right dorsal ankle last week. Finally he has an area on the left buttock. We have been using silver alginate to everything except the left buttock we are using Hydrofera Blue 06/30/20-Patient returns at 1 week, has been given a sample dose pack of NUZYRA which is a tetracycline derivative [omadacycline], patient has completed those, we have been using silver alginate to almost all the wounds except the left ischium where we are using Hydrofera Blue all of them look better 8/16; since I last saw the patient he has been doing well. The area on the left buttock, left lateral ankle and left foot are all  closed today. He has completed the [place] I gave him last time and tolerated this well. He still has open  areas on the right dorsal ankle and in the right first second toe area which we are using silver alginate. 8/23; we put him in his bilateral external compression stockings last week as he did not have anything open on either leg except for concerning area between the right first and second toe. He comes in today with an area on the left dorsal foot slightly more proximal than the original wound, the left lateral foot but this is actually a continuation of the area he had on the left lateral ankle from last time. As well he is opened up on the left buttock again. 8/30; comes in today with things looking a lot better. The area on the left lower ankle has closed down as has the left foot but with eschar in both areas. The area on the dorsal right ankle is also epithelialized. Very little remaining of the left buttock wound. We have been using silver alginate on all wound areas 9/13; the area in the first second toe webspace on the right has fully epithelialized. He still has some vulnerable epithelium on the right and the ankle and the dorsal foot. He notes weeping. He is using his juxta lite stocking. On the left again the left dorsal foot is closed left lateral ankle is closed. We went to the juxta lite stocking here as well. ooStill vulnerable in the left buttock although only 2 small open areas remain here 9/27; 2-week follow-up. We did not look at his left leg but the patient says everything is closed. He is a bit disturbed by the amount of edema in his left foot he is using juxta lite stockings but asking about over the toes stockings which would be 30/40, will talk to him next time. According to him there is no open wound on either the left foot or the left ankle/calf He has an open area on the dorsal right calf which I initially point a wrap injury. He has superficial remaining wound on the  left ischial tuberosity been using silver alginate although he says this sticks to the wound 10/5; we gave him 2-week follow-up but he called yesterday expressing some concerns about his right foot right ankle and the left buttock. He came in early. There is still no open areas on the left leg and that still in his juxta lite stocking 10/11; he only has 1 small area on the left buttock that remains measuring millimeters 1 mm. Still has the same irritated skin in this area. We recommended zinc oxide when this eventually closes and pressure relief is meticulously is he can do this. He still has an area on the dorsal part of his right first through third toes which is a bit irritated and still open and on the dorsal ankle near the crease of the ankle. We have been using silver alginate and using his own stocking. He has nothing open on the left leg or foot 10/25; 2-week follow-up. Not nearly as good on the left buttock as I was hoping. For open areas with 5 looking threatened small. He has the erythematous irritated chronic skin in this area. oo1 area on the right dorsal ankle. He reports this area bleeds easily ooRight dorsal foot just proximal to the base of his toes ooWe have been using silver alginate. 11/8; 2-week follow-up. Left buttock is about the same although I do not think the wounds are in the same location we have been using silver alginate. I have asked him to use zinc oxide  on the skin around the wounds. ooHe still has a small area on the right dorsal ankle he reports this bleeds easily ooRight dorsal foot just proximal to the base of the toes does not have anything open although the skin is very dry and scaly ooHe has a new opening on the nailbed of the left great toe. Nothing on the left ankle 11/29; 3-week follow-up. Left buttock has 2 open areas. And washing of these wounds today started bleeding easily. Suggesting very friable tissue. We have been using silver alginate.  Right dorsal ankle which I thought was initially a wrap injury we have been using silver alginate. Nothing open between the toes that I can see. He states the area on the left dorsal toe nailbed healed after the last visit in 2 or 3 days 12/13; 3-week follow-up. His left buttock now has 3 open areas but the original 2 areas are smaller using polymen here. Surrounding skin looks better. The right dorsal ankle is closed. He has a small opening on the right dorsal foot at the level of the third toe. In general the skin looks better here. He is wearing his juxta lite stocking on the left leg says there is nothing open 11/24/2020; 3 [name] follow-up. His left buttock still has the 3 open areas. We have been using polymen but due to lack of response he changed to [facility] area. Surrounding skin is dry erythematous and irritated looking. There is no evidence of infection either bacterial or fungal however there is loss of surface epithelium ooHe still has very dry skin in his foot causing irritation and erythema on the dorsal part of his toes. This is not responded to prolonged courses of antifungal simply looks dry and irritated 1/24; left buttock area still looks about the same he was unable to find the triad ointment that we had suggested. The area on the right lower leg just above the dorsal ankle has reopened and the areas on the right foot between the first second and second third toes and scaling on the bottom of the foot has been about the same for quite some time now. been using silver alginate to all wound areas 2/7; left buttock wound looked quite good although not much smaller in terms of surface area surrounding skin looks better. Only a few dry flaking areas on the right foot in between the first and second toes the skin generally looks better here [ammonium lactate]. Finally the area on the right dorsal ankle is closed 2/21; ooThere is no open area on the right foot even between the  right first and second toe. Skin around this area dorsally and plantar aspects look better. ooHe has a reopening of the area on the right ankle just above the crease of the ankle dorsally. I continue to think that this is probably friction from spasms may be even this time with his stocking under the compression stockings. ooWounds on his left buttock look about the same there a couple of areas that have reopened. He has a total square area of loss of epithelialization. This does not look like infection it looks like a contact dermatitis but I just cannot determine to what 3/14; there is nothing on the right foot between the first and second toes this was carefully inspected under illumination. Some chronic irritation on the dorsal part of his foot from toes 1-3 at the base. Nothing really open here substantially. Still has an area on the right foot/ankle that is actually larger  and hyper granulated. His buttock area on the left is just about closed however he has chronic inflammation with loss of the surface epithelial layer 3/28; 2-week follow-up. In clinic today with a new wound on the left anterior mid tibia. Says this happened about 2 [name] ago. He is not really sure how wonders about the spasticity of his legs at night whether that could have caused this other than that he does not have a good idea. He has been using topical antibiotics and silver alginate. The area on his right dorsal ankle seems somewhat better. ooFinally everything on his left buttock is closed. 4/11; 2-week follow-up. All of his wounds are better except for the area over the ischium and left buttock which have opened up widely again. At least part of this is covered in necrotic fibrinous material another part had rolled nonviable skin. The area on the right ankle, left anterior mid tibia are both a lot better. He had no open wounds on either foot including the areas between the first and second toes 4/25; patient  presents for 2-week follow-up. He states that the wounds are overall stable. He has no complaints today and states he is using Hydrofera Blue to open wounds. 5/9; have not seen this man in over a month. For my memory he has open areas on the left mid tibia and right ankle. T oday he has new open area on the right dorsal foot which we have not had a problem with recently. He has the sustained area on the left buttock He is also changed his insurance at the beginning of the year [employer]. We will need prior authorizations for debridement 5/23; patient presents for 2-week follow-up. He has prior authorizations for debridement. He denies any issues in the past 2 [name] with his wound care. He has been using Hydrofera Blue to all the wounds. He does report a circular rash to the upper left leg that is new. He denies acute signs of infection. 6/6; 2-week follow-up. The patient has open wounds on the left buttock which are worse than the last time I saw this about a month ago. He also has a new area to me on the left anterior mid tibia with some surrounding erythema. The area on the dorsal ankle on the right is closed but I think this will be a friction injury every time this area is exposed to either our wraps or his compression stockings caused by unrelenting spasms in this leg. 6/20; 2-week follow-up. oo The patient has open wounds on the left buttock which is about the same. Using [facility] here. - The left mid tibia has a static amount of surrounding erythema. Also a raised area in the center. We have been using Hydrofera Blue here. ooooFinally he has broken down in his dorsal right foot extending between the first and second toes and going to the base of the first and second toe webspace. I have previously assumed that this was severe venous hypertension 7/5; 2-week follow-up oo The left buttock wound actually looks better. We are using Hydrofera Blue. He has extensive skin irritation  around this area and I have not really been able to get that any better. I have tried Lotrisone i.e. antifungals and steroids. More most recently we have just been using Coloplast really looks about the same. oo The left mid tibia which was new last week culture to have very resistant staph aureus. Not only methicillin-resistant but doxycycline resistant. The patient has a plethora  of antibiotic allergies including sulfa, linezolid. I used topical bacitracin on this but he has not started this yet. oo In addition he has an expanding area of erythema with a wound on the dorsal right foot. I did a deep tissue culture of this area today 7/12; oo Left buttock area actually looks better surrounding skin also looks less irritated. oo Left mid tibia looks about the same. He is using bacitracin this is not worse oo Right dorsal foot looks about the same as well. oo The left first toe also looks about the same 7/19; left buttock wound continues to improve in terms of open areas oo Left mid tibia is still concerning amount of swelling he is using bacitracin oo Dorsal left first toe somewhat smaller oo Right dorsal foot somewhat smaller 7/25; left buttock wound actually continues to improve oo Left mid tibia area has less swelling. I gave him all my samples of new Nuzyra. This seems to have helped although the wound is still open it. His abrasion closed by here oo Left dorsal great toe really no better. Still a very nonviable surface oo Right dorsal foot perhaps some better. oo We have been using bacitracin and silver nitrate to the areas on his lower legs and Hydrofera Blue to the area on the buttock. Objective Constitutional Sitting or standing Blood Pressure is within target range for patient.. Pulse regular and within target range for patient.Marland Kitchen Respirations regular, non-labored and within target range.. Temperature is normal and within the target range for the patient.Marland Kitchen Appears in no  distress. Vitals Time Taken: 9:34 AM, Height: 70 in, Weight: 216 lbs, BMI: 31, Temperature: 98.69 F, Pulse: 114 bpm, Respiratory Rate: 16 breaths/min, Blood Pressure: 137/68 mmHg. General Notes: Wound exam; right dorsal foot erythema is better. The swelling on the left anterior mid tibia is also better with less surrounding erythema. Right foot wound actually looks quite healthy. Left buttock better looking periwound. Certainly less irritation and erythema Integumentary (Hair, Skin) Wound #41R status is Open. Original cause of wound was Gradually Appeared. The date acquired was: 03/16/2020. The wound has been in treatment 65 [name]. The wound is located on the Left Ischium. The wound measures 1cm length x 0.5cm width x 0.1cm depth; 0.393cm^2 area and 0.039cm^3 volume. There is Fat Layer (Subcutaneous Tissue) exposed. There is no tunneling or undermining noted. There is a medium amount of sanguinous drainage noted. The wound margin is distinct with the outline attached to the wound base. There is large (67-100%) red, pink, friable granulation within the wound bed. There is no necrotic tissue within the wound bed. Wound #51 status is Open. Original cause of wound was Trauma. The date acquired was: 01/26/2021. The wound has been in treatment 17 [name]. The wound is located on the Left,Anterior Lower Leg. The wound measures 1.5cm length x 0.6cm width x 0.1cm depth; 0.707cm^2 area and 0.071cm^3 volume. There is Fat Layer (Subcutaneous Tissue) exposed. There is no tunneling or undermining noted. There is a medium amount of serosanguineous drainage noted. The wound margin is distinct with the outline attached to the wound base. There is large (67-100%) red, friable, hyper - granulation within the wound bed. There is a small (1- 33%) amount of necrotic tissue within the wound bed including Adherent Slough. Wound #52 status is Open. Original cause of wound was Gradually Appeared. The date acquired was: 03/27/2021.  The wound has been in treatment 11 [name]. The wound is located on the Right,Dorsal Foot. The wound measures 1.1cm length x  2.6cm width x 0.1cm depth; 2.246cm^2 area and 0.225cm^3 volume. There is Fat Layer (Subcutaneous Tissue) exposed. There is no tunneling or undermining noted. There is a medium amount of serosanguineous drainage noted. The wound margin is distinct with the outline attached to the wound base. There is small (1-33%) pink granulation within the wound bed. There is a large (67-100%) amount of necrotic tissue within the wound bed including Adherent Slough. Wound #54 status is Open. Original cause of wound was Pressure Injury. The date acquired was: 05/11/2021. The wound has been in treatment 5 [name]. The wound is located on the Left,Distal Ischium. The wound measures 0.3cm length x 0.3cm width x 0.1cm depth; 0.071cm^2 area and 0.007cm^3 volume. There is Fat Layer (Subcutaneous Tissue) exposed. There is no tunneling or undermining noted. There is a medium amount of sanguinous drainage noted. The wound margin is distinct with the outline attached to the wound base. There is large (67-100%) red, friable granulation within the wound bed. There is no necrotic tissue within the wound bed. Wound #55 status is Open. Original cause of wound was Blister. The date acquired was: 05/26/2021. The wound has been in treatment 2 [name]. The wound is located on the Left T Great. The wound measures 0.8cm length x 1.2cm width x 0.2cm depth; 0.754cm^2 area and 0.151cm^3 volume. There is Fat Layer oe (Subcutaneous Tissue) exposed. There is no tunneling or undermining noted. There is a medium amount of serosanguineous drainage noted. The wound margin is distinct with the outline attached to the wound base. There is large (67-100%) pink granulation within the wound bed. There is a small (1-33%) amount of necrotic tissue within the wound bed including Adherent Slough. 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 There was a Excisional Skin/Subcutaneous Tissue oe Debridement with a total area of 0.96 sq cm performed by [name], MD. With the following instrument(s): Curette to remove Viable and Non-Viable tissue/material. Material removed includes Subcutaneous Tissue and Slough and. No specimens were taken. A time out was conducted at 10:05, prior to the start of the procedure. A Moderate amount of bleeding was controlled with Pressure. The procedure was tolerated well with a pain level of 0 throughout and a pain level of 0 following the procedure. Post Debridement Measurements: 0.8cm length x 1.2cm width x 0.2cm depth; 0.151cm^3 volume. Character of Wound/Ulcer Post Debridement is improved. Post procedure Diagnosis Wound #55: Same as Pre-Procedure Plan Follow-up Appointments: Return appointment in 3 [name]. - with Dr. Dellia Nims Bathing/ Shower/ Hygiene: May shower and wash wound with soap and water. - on days that dressing is changed Edema Control - Lymphedema / SCD / Other: Elevate legs to the level of the heart or above for 30 minutes daily and/or when sitting, a frequency of: - throughout the day Compression stocking or Garment 30-40 mm/Hg pressure to: - Juxtalite to both legs daily Off-Loading: Roho cushion for wheelchair Turn and reposition every 2 hours WOUND #41R: - Ischium Wound Laterality:  Left Cleanser: Soap  and Water Every Other Day/30 Days Discharge Instructions: May shower and wash wound with dial antibacterial soap and water prior to dressing change. Peri-Wound Care: Triamcinolone 15 (g) Every Other Day/30 Days Discharge Instructions: Use triamcinolone 15 (g) mixed with zinc oxide Peri-Wound Care: Zinc Oxide Ointment 30g tube Every Other Day/30 Days Discharge Instructions: Apply Zinc Oxide mixed with Triamcinolone to periwound with each dressing change Peri-Wound Care: Triad Hydrophilic Wound Dressing Tube, 6 (oz) Every Other Day/30 Days Discharge Instructions: Apply to periwound with each dressing change Prim Dressing: Hydrofera Blue Ready Foam, 2.5 x2.5 in Every Other Day/30 Days ary Discharge Instructions: Apply to wound bed as instructed Secondary Dressing: ComfortFoam Border, 4x4 in (silicone border) Every Other Day/30 Days Discharge Instructions: Apply over primary dressing as directed. WOUND #51: - Lower Leg Wound Laterality: Left, Anterior Cleanser: Soap and Water Every Other Day/30 Days Discharge Instructions: May shower and wash wound with dial antibacterial soap and water prior to dressing change. Topical: Bacitracin Every Other Day/30 Days Prim Dressing: KerraCel Ag Gelling Fiber Dressing, 2x2 in (silver alginate) Every Other Day/30 Days ary Discharge Instructions: Apply silver alginate to wound bed as instructed Secondary Dressing: ComfortFoam Border, 4x4 in (silicone border) Every Other Day/30 Days Discharge Instructions: Apply over primary dressing as directed. WOUND #52: - Foot Wound Laterality: Dorsal, Right Cleanser: Soap and Water Every Other Day/30 Days Discharge Instructions: May shower and wash wound with dial antibacterial soap and water prior to dressing change. Topical: Bacitracin Every Other Day/30 Days Prim Dressing: KerraCel Ag Gelling Fiber Dressing, 2x2 in (silver alginate) Every Other Day/30 Days ary Discharge Instructions: Apply silver  alginate to wound bed as instructed Secondary Dressing: ComfortFoam Border, 4x4 in (silicone border) Every Other Day/30 Days Discharge Instructions: Apply over primary dressing as directed. WOUND #54: - Ischium Wound Laterality: Left, Distal Cleanser: Soap and Water Every Other Day/30 Days Discharge Instructions: May shower and wash wound with dial antibacterial soap and water prior to dressing change. Peri-Wound Care: Triamcinolone 15 (g) Every Other Day/30 Days Discharge Instructions: Use triamcinolone 15 (g) mixed with zinc oxide Peri-Wound Care: Zinc Oxide Ointment 30g tube Every Other Day/30 Days Discharge Instructions: Apply Zinc Oxide mixed with Triamcinolone to periwound with each dressing change Peri-Wound Care: Triad Hydrophilic Wound Dressing Tube, 6 (oz) Every Other Day/30 Days Discharge Instructions: Apply to periwound with each dressing change Prim Dressing: Hydrofera Blue Ready Foam, 2.5 x2.5 in Every Other Day/30 Days ary Discharge Instructions: Apply to wound bed as instructed Secondary Dressing: ComfortFoam Border, 4x4 in (silicone border) Every Other Day/30 Days Discharge Instructions: Apply over primary dressing as directed. WOUND #55: - T Great Wound Laterality: Left oe Cleanser: Soap and Water Every Other Day/30 Days Discharge Instructions: May shower and wash wound with dial antibacterial soap and water prior to dressing change. Topical: Bacitracin Every Other Day/30 Days Prim Dressing: KerraCel Ag Gelling Fiber Dressing, 2x2 in (silver alginate) Every Other Day/30 Days ary Discharge Instructions: Apply silver alginate to wound bed as instructed Secondary Dressing: ComfortFoam Border, 4x4 in (silicone border) Every Other Day/30 Days Discharge Instructions: Apply over primary dressing as directed. 1. Still using bacitracin and silver alginate on the wounds on his lower extremity 2. The left great toe may need a different type of dressing perhaps Sorbact if there is  no improvement here. 3. I am worried that they could have been spreading staff aureus from site to site when they are changing the dressings. I talked to him about this. 4. Still using Hydrofera Blue to the  buttock Electronic Signature(s) Signed: 06/15/2021 10:28:49 AM By: [name] Ham MD Entered By: [name] on 06/15/2021 10:28:48 -------------------------------------------------------------------------------- SuperBill Details Patient Name: Date of Service: Depass, [name] 06/15/2021 Medical Record Number: [id number] Patient Account Number: [id number] Date of Birth/Sex: Treating RN: 08/03/1986 (35 y.o. 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, stage 3 L97.821 Non-pressure chronic ulcer of other part of left lower leg limited to breakdown of skin L97.521 Non-pressure chronic ulcer of other part of left foot limited to breakdown of skin A49.02 Methicillin resistant Staphylococcus aureus infection, unspecified site 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, complete Facility Procedures CPT4 Code: [id number] Description: [id number] - DEB SUBQ TISSUE 20 SQ CM/< ICD-10 Diagnosis Description L97.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

## 2021-06-16 NOTE — Progress Notes (Signed)
[name], [name] ([id number]) Visit Report for 06/15/2021 Arrival Information Details Patient Name: Date of Service: [name], [name] 06/15/2021 9:00 [name] Medical Record Number: [id number] Patient Account Number: [id number] Date of Birth/Sex: Treating RN: 11/04/1986 (35 y.o. [name] Primary Care Jowell Bossi: [name], [name] Other Clinician: Referring Davidmichael Zarazua: Treating Karine Garn/Extender: [name]: 71 Visit Information History Since Last Visit Added or deleted any medications: No Patient Arrived: Wheel Chair Any new allergies or adverse reactions: No Arrival Time: 09:32 Had [name] fall or experienced change in No Accompanied By: self activities of daily living that may affect Transfer Assistance: None risk of falls: Patient Identification Verified: Yes Signs or symptoms of abuse/neglect since last visito No Secondary Verification Process Completed: Yes Hospitalized since last visit: No Patient Requires Transmission-Based Precautions: No Implantable device outside of the clinic excluding No Patient Has Alerts: Yes cellular tissue based products placed in the center Patient Alerts: R ABI = 1.0 since last visit: L ABI = 1.1 Has Dressing in Place as Prescribed: Yes Pain Present Now: No Electronic Signature(s) Signed: 06/16/2021 10:27:28 AM By: Sandre Kitty Entered By: Sandre Kitty on 06/15/2021 09:32:58 -------------------------------------------------------------------------------- Encounter Discharge Information Details Patient Name: Date of Service: [name], [name] 06/15/2021 9:00 [name] Medical Record Number: [id number] Patient Account Number: [id number] Date of Birth/Sex: Treating RN: 08/25/1986 (35 y.o. [name] Primary Care Goddess Gebbia: [place], [place] Other Clinician: Referring Maritza Goldsborough: Treating Mikael Debell/Extender: [name]: 284 Encounter Discharge Information Items Post Procedure  Vitals Discharge Condition: Stable Temperature (F): 98.6 Ambulatory Status: Wheelchair Pulse (bpm): 114 Discharge Destination: Home Respiratory Rate (breaths/min): 16 Transportation: Private Auto Blood Pressure (mmHg): 137/68 Accompanied By: alone Schedule Follow-up Appointment: Yes Clinical Summary of Care: Patient Declined Electronic Signature(s) Signed: 06/15/2021 4:44:07 PM By: Levan Hurst RN, BSN Entered By: Levan Hurst on 06/15/2021 13:35:26 -------------------------------------------------------------------------------- Lower Extremity Assessment Details Patient Name: Date of Service: [name], [name] 06/15/2021 9:00 [name] Medical Record Number: [id number] Patient Account Number: [id number] Date of Birth/Sex: Treating RN: 02/19/1986 (35 y.o. [name] Primary Care Angelic Schnelle: [place], [place] Other Clinician: Referring Seichi Kaufhold: Treating Keithen Capo/Extender: [name]: 284 Edema Assessment Assessed: [Left: No] [Right: No] Edema: [Left: Yes] [Right: Yes] Calf Left: Right: Point of Measurement: 33 cm From Medial Instep 26 cm 33 cm Ankle Left: Right: Point of Measurement: 10 cm From Medial Instep 23 cm 23.5 cm Vascular Assessment Pulses: Dorsalis Pedis Palpable: [Left:Yes] [Right:Yes] Electronic Signature(s) Signed: 06/15/2021 4:44:07 PM By: Levan Hurst RN, BSN Entered By: Levan Hurst on 06/15/2021 10:03:23 -------------------------------------------------------------------------------- Multi Wound Chart Details Patient Name: Date of Service: [name], [name] 06/15/2021 9:00 [name] Medical Record Number: [id number] Patient Account Number: [id number] Date of Birth/Sex: Treating RN: 1986/01/01 (35 y.o. [name] Primary Care Avril Busser: [name], [name] Other Clinician: Referring Charrie Mcconnon: Treating Karlee Staff/Extender: Dutch Gray, [name]: 284 Vital Signs Height(in): 70 Pulse(bpm):  114 Weight(lbs): 216 Blood Pressure(mmHg): 137/68 Body Mass Index(BMI): 31 Temperature(F): 98.69 Respiratory Rate(breaths/min): 16 Photos: [41R:No Photos Left Ischium] [51:No Photos Left, Anterior Lower Leg] [52:No Photos Right, Dorsal Foot] Wound Location: [41R:Gradually Appeared] [51:Trauma] [52:Gradually Appeared] Wounding Event: [41R:Pressure Ulcer] [51:Trauma, Other] [52:Trauma, Other] Primary Etiology: [41R:Sleep Apnea, Hypertension, Paraplegia Sleep Apnea, Hypertension, Paraplegia Sleep Apnea, Hypertension, Paraplegia] Comorbid History: [41R:03/16/2020] [51:01/26/2021] [52:03/27/2021] Date Acquired: [41R:65] [51:17] [52:11] Weeks of Treatment: [41R:Open] [51:Open] [52:Open] Wound Status: [41R:Yes] [51:No] [52:No] Wound Recurrence: [41R:Yes] [51:No] [52:No] Clustered Wound: [41R:4] [51:[name]] [52:[name]] Clustered Quantity: [  41R:1x0.5x0.1] [51:1.5x0.6x0.1] [52:1.1x2.6x0.1] Measurements L x W x D (cm) [41R:0.393] [51:0.707] [52:2.246] [name] (cm) : rea [41R:0.039] [51:0.071] [52:0.225] Volume (cm) : [41R:78.20%] [51:-114.20%] [52:-19.20%] % Reduction in Area: [41R:78.50%] [51:-115.20%] [52:-19.70%] % Reduction in Volume: [41R:Category/Stage II] [51:Full Thickness Without Exposed] [52:Full Thickness Without Exposed] Classification: [41R:Medium] [51:Support Structures Medium] [52:Support Structures Medium] Exudate [name] mount: [41R:Sanguinous] [51:Serosanguineous] [52:Serosanguineous] Exudate Type: [41R:red] [51:red, brown] [52:red, brown] Exudate Color: [41R:Distinct, outline attached] [51:Distinct, outline attached] [52:Distinct, outline attached] Wound Margin: [41R:Large (67-100%)] [51:Large (67-100%)] [52:Small (1-33%)] Granulation [name] mount: [41R:Red, Pink, Friable] [51:Red, Hyper-granulation, Friable] [52:Pink] Granulation Quality: [41R:None Present (0%)] [51:Small (1-33%)] [52:Large (67-100%)] Necrotic [name] mount: [41R:Fat Layer (Subcutaneous Tissue): Yes Fat Layer (Subcutaneous Tissue): Yes Fat  Layer (Subcutaneous Tissue): Yes] Exposed Structures: [41R:Fascia: No Tendon: No Muscle: No Joint: No Bone: No Medium (34-66%)] [51:Fascia: No Tendon: No Muscle: No Joint: No Bone: No Small (1-33%)] [52:Fascia: No Tendon: No Muscle: No Joint: No Bone: No None] Epithelialization: [41R:[name]] [51:[name]] [52:[name]] Debridement: [41R:[name]] [51:[name]] [52:[name]] Tissue Debrided: [41R:[name]] [51:[name]] [52:[name]] Level: [41R:[name]] [51:[name]] [52:[name]] Debridement [name] (sq cm): [41R:rea [name]] [51:[name]] [52:[name]] Instrument: [41R:[name]] [51:[name]] [52:[name]] Bleeding: [41R:[name]] [51:[name]] [52:[name]] Hemostasis [name] chieved: [41R:[name]] [51:[name]] [52:[name]] Procedural Pain: [41R:[name]] [51:[name]] [52:[name]] Post Procedural Pain: Debridement Treatment Response: [name] [51:[name]] [52:[name]] Post Debridement Measurements L x [name] [51:[name]] [52:[name]] W x D (cm) [41R:[name]] [51:[name]] [52:[name]] Post Debridement Volume: (cm) [41R:[name]] [51:[name]] [52:[name]] Wound Number: [id number] [name] Photos: No Photos No Photos [name] Left, Distal Ischium Left T Great oe [name] Wound Location: Pressure Injury Blister [name] Wounding Event: Pressure Ulcer Neuropathic Ulcer-Non Diabetic [name] Primary Etiology: Sleep Apnea, Hypertension, Paraplegia Sleep Apnea, Hypertension, Paraplegia [name] Comorbid History: 05/11/2021 05/26/2021 [name] Date Acquired: 5 2 [name] Weeks of Treatment: Open Open [name] Wound Status: No No [name] Wound Recurrence: No No [name] Clustered Wound: [name] [name] [name] Clustered Quantity: 0.3x0.3x0.1 0.8x1.2x0.2 [name] Measurements L x W x D (cm) 0.071 0.754 [name] [name] (cm) : rea 0.007 0.151 [name] Volume (cm) : 95.00% 3.90% [name] % Reduction in [name] rea: 95.00% -91.10% [name] % Reduction in Volume: Category/Stage III Full Thickness Without Exposed [name] Classification: Support Structures Medium Medium [name] Exudate [name] mount: Sanguinous Serosanguineous [name] Exudate Type: red red, brown [name] Exudate Color: Distinct, outline attached Distinct, outline attached [name] Wound Margin: Large (67-100%) Large  (67-100%) [name] Granulation [name] mount: Red, Friable Pink [name] Granulation Quality: None Present (0%) Small (1-33%) [name] Necrotic [name] mount: Fat Layer (Subcutaneous Tissue): Yes Fat Layer (Subcutaneous Tissue): Yes [name] Exposed Structures: Fascia: No Fascia: No Tendon: No Tendon: No Muscle: No Muscle: No Joint: No Joint: No Bone: No Bone: No Medium (34-66%) None [name] Epithelialization: [name] Debridement - Excisional [name] Debridement: Pre-procedure Verification/Time Out [name] 10:05 [name] Taken: [name] Subcutaneous, Slough [name] Tissue Debrided: [name] Skin/Subcutaneous Tissue [name] Level: [name] 0.96 [name] Debridement [name] (sq cm): rea [name] Curette [name] Instrument: [name] Moderate [name] Bleeding: [name] Pressure [name] Hemostasis [name] chieved: [name] 0 [name] Procedural Pain: [name] 0 [name] Post Procedural Pain: [name] Procedure was tolerated well [name] Debridement Treatment Response: [name] 0.8x1.2x0.2 [name] Post Debridement Measurements L x W x D (cm) [name] 0.151 [name] Post Debridement Volume: (cm) [name] Debridement [name] Procedures Performed: Treatment Notes Electronic Signature(s) Signed: 06/15/2021 12:43:15 PM By: Linton Ham MD Signed: 06/15/2021 4:44:07 PM By: Levan Hurst RN, BSN Entered By: Linton Ham on 06/15/2021 10:14:49 -------------------------------------------------------------------------------- Multi-Disciplinary Care Plan Details Patient Name: Date of Service: [name], [name] 06/15/2021 9:00 [name] Medical Record Number: [id number] Patient Account Number: [id number] Date of Birth/Sex: Treating RN: 01-Jan-1986 (  35 y.o. [name] Primary Care Omid Deardorff: [name], [name] Other Clinician: Referring Jhalil Silvera: Treating Shevon Sian/Extender: [name]: [place] reviewed with physician Active Inactive Wound/Skin Impairment Nursing Diagnoses: Impaired tissue integrity Knowledge deficit related to ulceration/compromised skin  integrity Goals: Patient/caregiver will verbalize understanding of skin care regimen Date Initiated: 01/05/2016 Target Resolution Date: 07/17/2021 Goal Status: Active Ulcer/skin breakdown will have [name] volume reduction of 30% by week 4 Date Initiated: 01/05/2016 Date Inactivated: 12/22/2017 Target Resolution Date: 01/19/2018 Unmet Reason: complex wounds, Goal Status: Unmet infection Interventions: Assess patient/caregiver ability to obtain necessary supplies Assess ulceration(s) every visit Provide education on ulcer and skin care Notes: 02/02/21: Complex Care, ongoing. Electronic Signature(s) Signed: 06/15/2021 4:44:07 PM By: Levan Hurst RN, BSN Entered By: Levan Hurst on 06/15/2021 13:33:53 -------------------------------------------------------------------------------- Pain Assessment Details Patient Name: Date of Service: [name], [name] 06/15/2021 9:00 [name] Medical Record Number: [id number] Patient Account Number: [id number] Date of Birth/Sex: Treating RN: January 18, 1986 (35 y.o. [name] Primary Care Quashaun Lazalde: [place], [place] Other Clinician: Referring Rogers Ditter: Treating Shatia Sindoni/Extender: [name]: 284 Active Problems Location of Pain Severity and Description of Pain Patient Has Paino No Site Locations Pain Management and Medication Current Pain Management: Electronic Signature(s) Signed: 06/15/2021 4:44:07 PM By: Levan Hurst RN, BSN Signed: 06/16/2021 10:27:28 AM By: Sandre Kitty Entered By: Sandre Kitty on 06/15/2021 09:33:10 -------------------------------------------------------------------------------- Patient/Caregiver Education Details Patient Name: Date of Service: [name], [name] 7/25/2022andnbsp9:00 [name] Medical Record Number: [id number] Patient Account Number: [id number] Date of Birth/Gender: Treating RN: 12/28/1985 (35 y.o. [name] Primary Care Physician: Janine Limbo Other  Clinician: Referring Physician: Treating Physician/Extender: [name]: 284 Education Assessment Education Provided To: Patient Education Topics Provided Wound/Skin Impairment: Methods: Explain/Verbal Responses: State content correctly Electronic Signature(s) Signed: 06/15/2021 4:44:07 PM By: Levan Hurst RN, BSN Entered By: Levan Hurst on 06/15/2021 13:34:09 -------------------------------------------------------------------------------- Wound Assessment Details Patient Name: Date of Service: [name], [name] 06/15/2021 9:00 [name] Medical Record Number: [id number] Patient Account Number: [id number] Date of Birth/Sex: Treating RN: February 10, 1986 (35 y.o. [name] Primary Care Ruqayyah Lute: [name], [name] Other Clinician: Referring Alethia Melendrez: Treating Jaelin Fackler/Extender: [name]: 284 Wound Status Wound Number: [id number] Primary Etiology: Pressure Ulcer Wound Location: Left Ischium Wound Status: Open Wounding Event: Gradually Appeared Comorbid History: Sleep Apnea, Hypertension, Paraplegia Date Acquired: 03/16/2020 Weeks Of Treatment: 65 Clustered Wound: Yes Wound Measurements Length: (cm) 1 Width: (cm) 0.5 Depth: (cm) 0.1 Clustered Quantity: 4 Area: (cm) 0.393 Volume: (cm) 0.039 % Reduction in Area: 78.2% % Reduction in Volume: 78.5% Epithelialization: Medium (34-66%) Tunneling: No Undermining: No Wound Description Classification: Category/Stage II Wound Margin: Distinct, outline attached Exudate Amount: Medium Exudate Type: Sanguinous Exudate Color: red Foul Odor After Cleansing: No Slough/Fibrino No Wound Bed Granulation Amount: Large (67-100%) Exposed Structure Granulation Quality: Red, Pink, Friable Fascia Exposed: No Necrotic Amount: None Present (0%) Fat Layer (Subcutaneous Tissue) Exposed: Yes Tendon Exposed: No Muscle Exposed: No Joint Exposed: No Bone Exposed:  No Treatment Notes Wound #[id number] (Ischium) Wound Laterality: Left Cleanser Soap and Water Discharge Instruction: May shower and wash wound with dial antibacterial soap and water prior to dressing change. Peri-Wound Care Triamcinolone 15 (g) Discharge Instruction: Use triamcinolone 15 (g) mixed with zinc oxide Zinc Oxide Ointment 30g tube Discharge Instruction: Apply Zinc Oxide mixed with Triamcinolone to periwound with each dressing change Triad Hydrophilic Wound Dressing Tube, 6 (oz) Discharge Instruction: Apply to periwound  with each dressing change Topical Primary Dressing Hydrofera Blue Ready Foam, 2.5 x2.5 in Discharge Instruction: Apply to wound bed as instructed Secondary Dressing ComfortFoam Border, 4x4 in (silicone border) Discharge Instruction: Apply over primary dressing as directed. Secured With Compression Wrap Compression Stockings Environmental education officer) Signed: 06/15/2021 4:44:07 PM By: Levan Hurst RN, BSN Entered By: Levan Hurst on 06/15/2021 10:03:58 -------------------------------------------------------------------------------- Wound Assessment Details Patient Name: Date of Service: [name], [name] 06/15/2021 9:00 [name] Medical Record Number: [id number] Patient Account Number: [id number] Date of Birth/Sex: Treating RN: 1986/09/06 (35 y.o. [name] Primary Care Tenika Keeran: [name], [name] Other Clinician: Referring Chris Cripps: Treating Donette Mainwaring/Extender: [name]: 284 Wound Status Wound Number: 51 Primary Etiology: Trauma, Other Wound Location: Left, Anterior Lower Leg Wound Status: Open Wounding Event: Trauma Comorbid History: Sleep Apnea, Hypertension, Paraplegia Date Acquired: 01/26/2021 Weeks Of Treatment: 17 Clustered Wound: No Wound Measurements Length: (cm) 1.5 Width: (cm) 0.6 Depth: (cm) 0.1 Area: (cm) 0.707 Volume: (cm) 0.071 % Reduction in Area: -114.2% % Reduction in Volume:  -115.2% Epithelialization: Small (1-33%) Tunneling: No Undermining: No Wound Description Classification: Full Thickness Without Exposed Support Structures Wound Margin: Distinct, outline attached Exudate Amount: Medium Exudate Type: Serosanguineous Exudate Color: red, brown Foul Odor After Cleansing: No Slough/Fibrino Yes Wound Bed Granulation Amount: Large (67-100%) Exposed Structure Granulation Quality: Red, Hyper-granulation, Friable Fascia Exposed: No Necrotic Amount: Small (1-33%) Fat Layer (Subcutaneous Tissue) Exposed: Yes Necrotic Quality: Adherent Slough Tendon Exposed: No Muscle Exposed: No Joint Exposed: No Bone Exposed: No Treatment Notes Wound #51 (Lower Leg) Wound Laterality: Left, Anterior Cleanser Soap and Water Discharge Instruction: May shower and wash wound with dial antibacterial soap and water prior to dressing change. Peri-Wound Care Topical Bacitracin Primary Dressing KerraCel Ag Gelling Fiber Dressing, 2x2 in (silver alginate) Discharge Instruction: Apply silver alginate to wound bed as instructed Secondary Dressing ComfortFoam Border, 4x4 in (silicone border) Discharge Instruction: Apply over primary dressing as directed. Secured With Compression Wrap Compression Stockings Environmental education officer) Signed: 06/15/2021 4:44:07 PM By: Levan Hurst RN, BSN Entered By: Levan Hurst on 06/15/2021 10:04:55 -------------------------------------------------------------------------------- Wound Assessment Details Patient Name: Date of Service: [name], [name] 06/15/2021 9:00 [name] Medical Record Number: [id number] Patient Account Number: [id number] Date of Birth/Sex: Treating RN: 1986/04/19 (35 y.o. [name] Primary Care Carmon Sahli: [name], [name] Other Clinician: Referring Kemoni Quesenberry: Treating Maryse Brierley/Extender: [name]: 284 Wound Status Wound Number: 52 Primary Etiology: Trauma, Other Wound  Location: Right, Dorsal Foot Wound Status: Open Wounding Event: Gradually Appeared Comorbid History: Sleep Apnea, Hypertension, Paraplegia Date Acquired: 03/27/2021 Weeks Of Treatment: 11 Clustered Wound: No Wound Measurements Length: (cm) 1.1 Width: (cm) 2.6 Depth: (cm) 0.1 Area: (cm) 2.246 Volume: (cm) 0.225 % Reduction in Area: -19.2% % Reduction in Volume: -19.7% Epithelialization: None Tunneling: No Undermining: No Wound Description Classification: Full Thickness Without Exposed Support Structures Wound Margin: Distinct, outline attached Exudate Amount: Medium Exudate Type: Serosanguineous Exudate Color: red, brown Foul Odor After Cleansing: No Slough/Fibrino Yes Wound Bed Granulation Amount: Small (1-33%) Exposed Structure Granulation Quality: Pink Fascia Exposed: No Necrotic Amount: Large (67-100%) Fat Layer (Subcutaneous Tissue) Exposed: Yes Necrotic Quality: Adherent Slough Tendon Exposed: No Muscle Exposed: No Joint Exposed: No Bone Exposed: No Treatment Notes Wound #52 (Foot) Wound Laterality: Dorsal, Right Cleanser Soap and Water Discharge Instruction: May shower and wash wound with dial antibacterial soap and water prior to dressing change. Peri-Wound Care Topical Bacitracin Primary Dressing KerraCel Ag Gelling Fiber Dressing, 2x2 in (  silver alginate) Discharge Instruction: Apply silver alginate to wound bed as instructed Secondary Dressing ComfortFoam Border, 4x4 in (silicone border) Discharge Instruction: Apply over primary dressing as directed. Secured With Compression Wrap Compression Stockings Environmental education officer) Signed: 06/15/2021 4:44:07 PM By: Levan Hurst RN, BSN Entered By: Levan Hurst on 06/15/2021 10:05:23 -------------------------------------------------------------------------------- Wound Assessment Details Patient Name: Date of Service: [name], [name] 06/15/2021 9:00 [name] Medical Record Number:  [id number] Patient Account Number: [id number] Date of Birth/Sex: Treating RN: 1986-05-13 (35 y.o. [name] Primary Care Dalayna Lauter: [name], [name] Other Clinician: Referring Alakai Macbride: Treating Clatie Kessen/Extender: [name]: 284 Wound Status Wound Number: 54 Primary Etiology: Pressure Ulcer Wound Location: Left, Distal Ischium Wound Status: Open Wounding Event: Pressure Injury Comorbid History: Sleep Apnea, Hypertension, Paraplegia Date Acquired: 05/11/2021 Weeks Of Treatment: 5 Clustered Wound: No Wound Measurements Length: (cm) 0.3 Width: (cm) 0.3 Depth: (cm) 0.1 Area: (cm) 0.071 Volume: (cm) 0.007 % Reduction in Area: 95% % Reduction in Volume: 95% Epithelialization: Medium (34-66%) Tunneling: No Undermining: No Wound Description Classification: Category/Stage III Wound Margin: Distinct, outline attached Exudate Amount: Medium Exudate Type: Sanguinous Exudate Color: red Foul Odor After Cleansing: No Slough/Fibrino No Wound Bed Granulation Amount: Large (67-100%) Exposed Structure Granulation Quality: Red, Friable Fascia Exposed: No Necrotic Amount: None Present (0%) Fat Layer (Subcutaneous Tissue) Exposed: Yes Tendon Exposed: No Muscle Exposed: No Joint Exposed: No Bone Exposed: No Treatment Notes Wound #54 (Ischium) Wound Laterality: Left, Distal Cleanser Soap and Water Discharge Instruction: May shower and wash wound with dial antibacterial soap and water prior to dressing change. Peri-Wound Care Triamcinolone 15 (g) Discharge Instruction: Use triamcinolone 15 (g) mixed with zinc oxide Zinc Oxide Ointment 30g tube Discharge Instruction: Apply Zinc Oxide mixed with Triamcinolone to periwound with each dressing change Triad Hydrophilic Wound Dressing Tube, 6 (oz) Discharge Instruction: Apply to periwound with each dressing change Topical Primary Dressing Hydrofera Blue Ready Foam, 2.5 x2.5 in Discharge  Instruction: Apply to wound bed as instructed Secondary Dressing ComfortFoam Border, 4x4 in (silicone border) Discharge Instruction: Apply over primary dressing as directed. Secured With Compression Wrap Compression Stockings Environmental education officer) Signed: 06/15/2021 4:44:07 PM By: Levan Hurst RN, BSN Entered By: Levan Hurst on 06/15/2021 10:06:29 -------------------------------------------------------------------------------- Wound Assessment Details Patient Name: Date of Service: Stiefel, [name] 06/15/2021 9:00 [name] Medical Record Number: [id number] Patient Account Number: [id number] Date of Birth/Sex: Treating RN: Feb 16, 1986 (35 y.o. [name] Primary Care Yesenia Fontenette: [name], [name] Other Clinician: Referring Wagner Tanzi: Treating Kuba Shepherd/Extender: [name]: 284 Wound Status Wound Number: 55 Primary Etiology: Neuropathic Ulcer-Non Diabetic Wound Location: Left T Great oe Wound Status: Open Wounding Event: Blister Comorbid History: Sleep Apnea, Hypertension, Paraplegia Date Acquired: 05/26/2021 Weeks Of Treatment: 2 Clustered Wound: No Wound Measurements Length: (cm) 0.8 Width: (cm) 1.2 Depth: (cm) 0.2 Area: (cm) 0.754 Volume: (cm) 0.151 % Reduction in Area: 3.9% % Reduction in Volume: -91.1% Epithelialization: None Tunneling: No Undermining: No Wound Description Classification: Full Thickness Without Exposed Support Structures Wound Margin: Distinct, outline attached Exudate Amount: Medium Exudate Type: Serosanguineous Exudate Color: red, brown Foul Odor After Cleansing: No Slough/Fibrino No Wound Bed Granulation Amount: Large (67-100%) Exposed Structure Granulation Quality: Pink Fascia Exposed: No Necrotic Amount: Small (1-33%) Fat Layer (Subcutaneous Tissue) Exposed: Yes Necrotic Quality: Adherent Slough Tendon Exposed: No Muscle Exposed: No Joint Exposed: No Bone Exposed: No Treatment  Notes Wound #55 (Toe Great) Wound Laterality: Left Cleanser Soap and Water Discharge Instruction: May shower and  wash wound with dial antibacterial soap and water prior to dressing change. Peri-Wound Care Topical Bacitracin Primary Dressing KerraCel Ag Gelling Fiber Dressing, 2x2 in (silver alginate) Discharge Instruction: Apply silver alginate to wound bed as instructed Secondary Dressing ComfortFoam Border, 4x4 in (silicone border) Discharge Instruction: Apply over primary dressing as directed. Secured With Compression Wrap Compression Stockings Environmental education officer) Signed: 06/15/2021 4:44:07 PM By: Levan Hurst RN, BSN Entered By: Levan Hurst on 06/15/2021 10:07:00 -------------------------------------------------------------------------------- Vitals Details Patient Name: Date of Service: Acocella, [name] 06/15/2021 9:00 [name] Medical Record Number: [id number] Patient Account Number: [id number] Date of Birth/Sex: Treating RN: 03-18-1986 (35 y.o. [name] Primary Care Kamiah Fite: [place], [place] Other Clinician: Referring Latesia Norrington: Treating Jakaden Ouzts/Extender: [name]: 284 Vital Signs Time Taken: 09:34 Temperature (F): 98.69 Height (in): 70 Pulse (bpm): 114 Weight (lbs): 216 Respiratory Rate (breaths/min): 16 Body Mass Index (BMI): 31 Blood Pressure (mmHg): 137/68 Reference Range: 80 - 120 mg / dl Electronic Signature(s) Signed: 06/16/2021 10:27:28 AM By: Sandre Kitty Entered By: Sandre Kitty on 06/15/2021 09:34:27

## 2021-06-17 ENCOUNTER — Encounter (HOSPITAL_BASED_OUTPATIENT_CLINIC_OR_DEPARTMENT_OTHER): Payer: 59 | Admitting: Internal Medicine

## 2021-07-07 ENCOUNTER — Other Ambulatory Visit: Payer: Self-pay

## 2021-07-07 ENCOUNTER — Encounter (HOSPITAL_BASED_OUTPATIENT_CLINIC_OR_DEPARTMENT_OTHER): Payer: 59 | Attending: Internal Medicine | Admitting: Internal Medicine

## 2021-07-07 DIAGNOSIS — A4902 Methicillin resistant Staphylococcus aureus infection, unspecified site: Secondary | ICD-10-CM | POA: Diagnosis not present

## 2021-07-07 DIAGNOSIS — Z9081 Acquired absence of spleen: Secondary | ICD-10-CM | POA: Insufficient documentation

## 2021-07-07 DIAGNOSIS — Z8614 Personal history of Methicillin resistant Staphylococcus aureus infection: Secondary | ICD-10-CM | POA: Insufficient documentation

## 2021-07-07 DIAGNOSIS — L97521 Non-pressure chronic ulcer of other part of left foot limited to breakdown of skin: Secondary | ICD-10-CM | POA: Insufficient documentation

## 2021-07-07 DIAGNOSIS — L89323 Pressure ulcer of left buttock, stage 3: Secondary | ICD-10-CM | POA: Insufficient documentation

## 2021-07-07 DIAGNOSIS — Z89422 Acquired absence of other left toe(s): Secondary | ICD-10-CM | POA: Insufficient documentation

## 2021-07-07 DIAGNOSIS — G8221 Paraplegia, complete: Secondary | ICD-10-CM | POA: Diagnosis not present

## 2021-07-07 DIAGNOSIS — Z87891 Personal history of nicotine dependence: Secondary | ICD-10-CM | POA: Insufficient documentation

## 2021-07-07 DIAGNOSIS — I10 Essential (primary) hypertension: Secondary | ICD-10-CM | POA: Diagnosis not present

## 2021-07-07 DIAGNOSIS — L97511 Non-pressure chronic ulcer of other part of right foot limited to breakdown of skin: Secondary | ICD-10-CM | POA: Insufficient documentation

## 2021-07-07 DIAGNOSIS — I87332 Chronic venous hypertension (idiopathic) with ulcer and inflammation of left lower extremity: Secondary | ICD-10-CM | POA: Insufficient documentation

## 2021-07-07 DIAGNOSIS — G4730 Sleep apnea, unspecified: Secondary | ICD-10-CM | POA: Insufficient documentation

## 2021-07-07 NOTE — Progress Notes (Signed)
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 debridement indicated. Continue Hydrofera Blue difficult areas between his toes first and third on the left than first and second on the right. I'[name] going to change to silver alginate from silver collagen. Continue ketoconazole as I suspect underlying tinea pedis 05/26/17; left lateral leg venous insufficiency wound. We've been using Hydrofera Blue. I believe that there is expanding epithelialization over the surface of the wound albeit not coming from the wound circumference. This is [name] bit of an odd situation in which the epithelialization seems to be coming from the surface of the wound rather than in the exact circumference. There is still small open areas mostly along the lateral margin of the wound. He has unchanged areas between the left first and second and the right first second toes which I been treating for tenia pedis 06/02/17; left lateral leg venous insufficiency wound. We have been using Hydrofera Blue. Somewhat smaller from the wound circumference. The surface of the wound remains [name] bit on it almost epithelialized sedation in appearance. I use an open curette today debridement in the surface of all of this especially the edges Small open wounds remaining on the dorsal right first and second toe interspace and the plantar left first second toe and her face on the left 06/09/17; wound on the left lateral leg continues to be smaller but very gradual and very dry surface using Hydrofera Blue 06/16/17 requires weekly debridements now on the left lateral leg although this continues  to contract. I changed to silver collagen last week because of dryness of the wound bed. Using Iodoflex  to the areas on his first and second toes/web space bilaterally 06/24/17; patient with history of paraplegia also chronic venous insufficiency with lymphedema. Has [name] very difficult wound on the left lateral leg. This has been gradually reducing in terms of with but comes in with [name] very dry adherent surface. High switch to silver collagen [name] week or so ago with hydrogel to keep the area moist. This is been refractory to multiple dressing attempts. He also has areas in his first and second toes bilaterally in the anterior and posterior web space. I had been using Iodoflex here after [name] prolonged course of silver alginate with ketoconazole was ineffective [question tinea pedis] 07/14/17; patient arrives today with [name] very difficult adherent material over his left lateral lower leg wound. He also has surrounding erythema and poorly controlled edema. He was switched his Santyl last visit which the nurses are applying once during his doctor visit and once on [name] nurse visit. He was also reduced to 2 layer compression I'[name] not exactly sure of the issue here. 07/21/17; better surface today after 1 week of Iodoflex. Significant cellulitis that we treated last week also better. [Doxycycline] 07/28/17 better surface today with now 2 [name] of Iodoflex. Significant cellulitis treated with doxycycline. He has now completed the doxycycline and he is back to his usual degree of chronic venous inflammation/stasis dermatitis. He reminds me he has had ablations surgery here 08/04/17; continued improvement with Iodoflex to the left lateral leg wound in terms of the surface of the wound although the dimensions are better. 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 Problematic areas on the plantar aspect of the first second toe web space on the left and the dorsal  aspect of the first second toe web space on the right. At one point I felt these were probably related to chronic fungal infections in treated him aggressively for this although we have not made any improvement here. 08/11/17; left lateral leg. Surface continues to improve with the Iodoflex although we are not seeing much improvement in overall wound dimensions. Areas on his plantar left foot and right foot show no improvement. In fact the right foot looks somewhat worse 08/18/17; left lateral leg. We changed to [facility] Blue last week after [name] prolonged course of Iodoflex which helps get the surface better. It appears that the wound with is improved. Continue with difficult areas on the left dorsal first second and plantar first second on the right 09/01/17; patient arrives in clinic today having had [name] temperature of 103 yesterday. He was seen in the ER and [facility]. The patient was concerned he could have cellulitis again in the right leg however they diagnosed him with [name] UTI and he is now on Keflex. He has [name] history of cellulitis which is been recurrent and difficult but this is been in the left leg, in the past 5 use doxycycline. He does in and out catheterizations at home which are risk factors for UTI 09/08/17; patient will be completing his Keflex this weekend. The erythema on the left leg is considerably better. He has [name] new wound today on the medial part of the right leg small superficial almost looks like [name] skin tear. He has worsening of the area on the right dorsal first and second toe. His major area on the left lateral leg is better. Using Hydrofera Blue on all areas 09/15/17; gradual reduction in width on the long wound in the left lateral  leg. No debridement required. He also has wounds on the plantar aspect of his left first second toe web space and on the dorsal aspect of the right first second toe web space. 09/22/17; there continues to be very gradual improvements in the dimensions of  the left lateral leg wound. He hasn't round erythematous spot with might be pressure on his wheelchair. There is no evidence obviously of infection no purulence no warmth He has [name] dry scaled area on the plantar aspect of the left first second toe Improved area on the dorsal right first second toe. 09/29/17; left lateral leg wound continues to improve in dimensions mostly with an is still [name] fairly long but increasingly narrow wound. He has [name] dry scaled area on the plantar aspect of his left first second toe web space Increasingly concerning area on the dorsal right first second toe. In fact I am concerned today about possible cellulitis around this wound. The areas extending up his second toe and although there is deformities here almost appears to abut on the nailbed. 10/06/17; left lateral leg wound continues to make very gradual progress. Tissue culture I did from the right first second toe dorsal foot last time grew MRSA and enterococcus which was vancomycin sensitive. This was not sensitive to clindamycin or doxycycline. He is allergic to Zyvox and sulfa we have therefore arrange for him to have dalvance infusion tomorrow. He is had this in the past and tolerated it well 10/20/17; left lateral leg wound continues to make decent progress. This is certainly reduced in terms of with there is advancing epithelialization.The cellulitis in the right foot looks better although he still has [name] deep wound in the dorsal aspect of the first second toe web space. Plantar left first toe web space on the left I think is making some progress 10/27/17; left lateral leg wound continues to make decent progress. Advancing epithelialization.using Hydrofera Blue The right first second toe web space wound is better-looking using silver alginate Improvement in the left plantar first second toe web space. Again using silver alginate 11/03/17 left lateral leg wound continues to make decent progress albeit slowly. Using  [facility] The right per second toe web space continues to be [name] very problematic looking punched out wound. I obtained [name] piece of tissue for deep culture I did extensively treated this for fungus. It is difficult to imagine that this is [name] pressure area as the patient states other than going outside he doesn't really wear shoes at home The left plantar first second toe web space looked fairly senescent. Necrotic edges. This required debridement change to [facility] Blue to all wound areas 11/10/17; left lateral leg wound continues to contract. Using Hydrofera Blue On the right dorsal first second toe web space dorsally. Culture I did of this area last week grew MRSA there is not an easy oral option in this patient was multiple antibiotic allergies or intolerances. This was only [name] rare culture isolate I'[name] therefore going to use Bactroban under silver alginate On the left plantar first second toe web space. Debridement is required here. This is also unchanged 11/17/17; left lateral leg wound continues to contract using Hydrofera Blue this is no longer the major issue. The major concern here is the right first second toe web space. He now has an open area going from dorsally to the plantar aspect. There is now wound on the inner lateral part of the first toe. Not [name] very viable surface on this. There is erythema spreading  medially into the forefoot. No major change in the left first second toe plantar wound 11/24/17; left lateral leg wound continues to contract using Hydrofera Blue. Nice improvement today The right first second toe web space all of this looks [name] lot less angry than last week. I have given him clindamycin and topical Bactroban for MRSA and terbinafine for the possibility of underlining tinea pedis that I could not control with ketoconazole. Looks somewhat better The area on the plantar left first second toe web space is weeping with dried debris around the wound 12/01/17; left lateral leg  wound continues to contract he Hydrofera Blue. It is becoming thinner in terms of with nevertheless it is making good improvement. The right first second toe web space looks less angry but still [name] large necrotic-looking wounds starting on the plantar aspect of the right foot extending between the toes and now extensively on the base of the right second toe. I gave him clindamycin and topical Bactroban for MRSA anterior benefiting for the possibility of underlying tinea pedis. Not looking better today The area on the left first/second toe looks better. Debrided of necrotic debris 12/05/17* the patient was worked in urgently today because over the weekend he found blood on his incontinence bad when he woke up. He was found to have an ulcer by his wife who does most of his wound care. He came in today for [place] to look at this. He has not had [name] history of wounds in his buttocks in spite of his paraplegia. 12/08/17; seen in follow-up today at his usual appointment. He was seen earlier this week and found to have [name] new wound on his buttock. We also follow him for wounds on the left lateral leg, left first second toe web space and right first second toe web space 12/15/17; we have been using Hydrofera Blue to the left lateral leg which has improved. The right first second toe web space has also improved. Left first second toe web space plantar aspect looks stable. The left buttock has worsened using Santyl. Apparently the buttock has drainage 12/22/17; we have been using Hydrofera Blue to the left lateral leg which continues to improve now 2 small wounds separated by normal skin. He tells [place] he had [name] fever up to 100 yesterday he is prone to UTIs but has not noted anything different. He does in and out catheterizations. The area between the first and second toes today does not look good necrotic surface covered with what looks to be purulent drainage and erythema extending into the third toe. I had gotten this to  something that I thought look better last time however it is not look good today. He also has [name] necrotic surface over the buttock wound which is expanded. I thought there might be infection under here so I removed [name] lot of the surface with [name] #5 curet though nothing look like it really needed culturing. He is been using Santyl to this area 12/27/17; his original wound on the left lateral leg continues to improve using Hydrofera Blue. I gave him samples of Baxdella although he was unable to take them out of fear for an allergic reaction ["lump in his throat"].the culture I did of the purulent drainage from his second toe last week showed both enterococcus and [name] set Enterobacter I was also concerned about the erythema on the bottom of his foot although paradoxically although this looks somewhat better today. Finally his pressure ulcer on the left buttock looks worse  this is clearly now [name] stage III wound necrotic surface requiring debridement. We've been using silver alginate here. They came up today that he sleeps in [name] recliner, I'[name] not sure why but I asked him to stop this 01/03/18; his original wound we've been using Hydrofera Blue is now separated into 2 areas. Ulcer on his left buttock is better he is off the recliner and sleeping in bed Finally both wound areas between his first and second toes also looks some better 01/10/18; his original wound on the left lateral leg is now separated into 2 wounds we've been using Hydrofera Blue Ulcer on his left buttock has some drainage. There is [name] small probing site going into muscle layer superiorly.using silver alginate -He arrives today with [name] deep tissue injury on the left heel The wound on the dorsal aspect of his first second toe on the left looks [name] lot betterusing silver alginate ketoconazole The area on the first second toe web space on the right also looks [name] lot bette 01/17/18; his original wound on the left lateral leg continues to progress using  Hydrofera Blue Ulcer on his left buttock also is smaller surface healthier except for [name] small probing site going into the muscle layer superiorly. 2.4 cm of tunneling in this area DTI on his left heel we have only been offloading. Looks better than last week no threatened open no evidence of infection the wound on the dorsal aspect of the first second toe on the left continues to look like it's regressing we have only been using silver alginate and terbinafine orally The area in the first second toe web space on the right also looks to be [name] lot better using silver alginate and terbinafine I think this was prompted by tinea pedis 01/31/18; the patient was hospitalized in [place] last week apparently for [name] complicated UTI. He was discharged on cefepime he does in and out catheterizations. In the hospital he was discovered [name] I don't mild elevation of AST and ALT and the terbinafine was stopped.predictably the pressure ulcer on s his buttock looks betterusing silver alginate. The area on the left lateral leg also is better using Hydrofera Blue. The area between the first and second toes on the left better. First and second toes on the right still substantial but better. Finally the DTI on the left heel has held together and looks like it's resolving 02/07/18-he is here in follow-up evaluation for multiple ulcerations. He has new injury to the lateral aspect of the last issue [name] pressure ulcer, he states this is from adhesive removal trauma. He states he has tried multiple adhesive products with no success. All other ulcers appear stable. The left heel DTI is resolving. We will continue with same treatment plan and follow-up next week. 02/14/18; follow-up for multiple areas. He has [name] new area last week on the lateral aspect of his pressure ulcer more over the posterior trochanter. The original pressure ulcer looks quite stable has healthy granulation. 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 Almost fully epithelialized on the original superior area using Hydrofera Blue DTI on the left heel has peeled off this week to reveal [name] small superficial wound under denuded skin and subcutaneous tissue Both areas between the first and second toes look better including nothing open on the left 02/21/18; The patient's wounds on his left ischial tuberosity and posterior left greater trochanter actually looked  better. He has [name] large area of irritation around the area which I think is contact dermatitis. I am doubtful that this is fungal His original wound on the left lateral calf continues to improve we have been using Hydrofera Blue There is no open area in the left first second toe web space although there is [name] lot of thick callus The DTI on the left heel required debridement today of necrotic surface eschar and subcutaneous tissue using silver alginate Finally the area on the right first second toe webspace continues to contract using silver alginate and ketoconazole 02/28/18 Left ischial tuberosity wounds look better using silver alginate. Original wound on the left calf only has one small open area left using Hydrofera Blue DTI on the left heel required debridement mostly removing skin from around this wound surface. Using silver alginate The areas on the right first/second toe web space using silver alginate and ketoconazole 03/08/18 on evaluation today patient appears to be doing decently well as best I can tell in regard to his wounds. This is the first time that I have seen him as he generally is followed by Dr. Dellia Nims. With that being said none of his wounds appear to be infected he does have an area where there is some skin covering what appears to be [name] new wound on the left dorsal surface of his great toe. This is right at the nail bed. With that being said I do believe that debrided away some of  the excess skin can be of benefit in this regard. Otherwise he has been tolerating the dressing changes without complication. 03/14/18; patient arrives today with the multiplicity of wounds that we are following. He has not been systemically unwell Original wound on the left lateral calf now only has 2 small open areas we've been using Hydrofera Blue which should continue The deep tissue injury on the left heel requires debridement today. We've been using silver alginate The left first second toe and the right first second toe are both are reminiscence what I think was tinea pedis. Apparently some of the callus Surface between the toes was removed last week when it started draining. Purulent drainage coming from the wound on the ischial tuberosity on the left. 03/21/18-He is here in follow-up evaluation for multiple wounds. 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 He tolerated debridement. The only change to last week's recommendations is to discontinue antifungal cream between toes. He will follow-up next week 03/28/18; following up for multiple wounds;Concern this week is streaking redness and swelling in the right foot. He is going to need antibiotics for this. 03/31/18; follow-up for right foot cellulitis. Streaking redness and swelling in the right foot on 03/28/18. 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 I put him on clindamycin 300 mg every 6 and brought him in for [name] quick check. He has an open wound between his first and second toes on the right foot as [name] potential source. 04/04/18; Right foot cellulitis is resolving he is completing clindamycin. This is truly good news Left lateral calf wound which is initial wound only has one small open area inferiorly this is close to healing out. He has compression stockings. We will use Hydrofera Blue right down to the epithelialization of this Nonviable  surface on the left heel which was initially pressure with [name] DTI. We've been using Hydrofera Blue. I'[name] going to switch this back to silver alginate Left first second  toe/tinea pedis this looks better using silver alginate Right first second toe tinea pedis using silver alginate Large pressure ulcers on theLeft ischial tuberosity. Small wound here Looks better. I am uncertain about the surface over the large wound. Using silver alginate 04/11/18; Cellulitis in the right foot is resolved Left lateral calf wound which was his original wounds still has 2 tiny open areas remaining this is just about closed Nonviable surface on the left heel is better but still requires debridement Left first second toe/tinea pedis still open using silver alginate Right first second toe wound tinea pedis I asked him to go back to using ketoconazole and silver alginate Large pressure ulcers on the left ischial tuberosity this shear injury here is resolved. Wound is smaller. No evidence of infection using silver alginate 04/18/18; Patient arrives with an intense area of cellulitis in the right mid lower calf extending into the right heel area. Bright red and warm. Smaller area on the left anterior leg. He has [name] significant history of MRSA. He will definitely need antibioticsdoxycycline He now has 2 open areas on the left ischial tuberosity the original large wound and now [name] satellite area which I think was above his initial satellite areas. Not [name] wonderful surface on this satellite area surrounding erythema which looks like pressure related. His left lateral calf wound again his original wound is just about closed Left heel pressure injury still requiring debridement Left first second toe looks [name] lot better using silver alginate Right first second toe also using silver alginate and ketoconazole cream also looks better 04/20/18; the patient was worked in early today out of concerns with his cellulitis on the right leg. I  had started him on doxycycline. This was 2 days ago. His wife was concerned about the swelling in the area. Also concerned about the left buttock. He has not been systemically unwell no fever chills. No nausea vomiting or diarrhea 04/25/18; the patient's left buttock wound is continued to deteriorate he is using Hydrofera Blue. He is still completing clindamycin for the cellulitis on the right leg although all of this looks better. 05/02/18 Left buttock wound still with [name] lot of drainage and [name] very tightly adherent fibrinous necrotic surface. He has [name] deeper area superiorly The left lateral calf wound is still closed DTI wound on the left heel necrotic surface especially the circumference using Iodoflex Areas between his left first second toe and right first second toe both look better. Dorsally and the right first second toe he had [name] necrotic surface although at smaller. In using silver alginate and ketoconazole. I did [name] culture last week which was [name] deep tissue culture of the reminiscence of the open wound on the right first second toe dorsally. This grew [name] few Acinetobacter and [name] few methicillin-resistant staph aureus. Nevertheless the area actually this week looked better. I didn't feel the need to specifically address this at least in terms of systemic antibiotics. 05/09/18; wounds are measuring larger more drainage per our intake. We are using Santyl covered with alginate on the large superficial buttock wounds, Iodosorb on the left heel, ketoconazole and silver alginate to the dorsal first and second toes bilaterally. 05/16/18; The area on his left buttock better in some aspects although the area superiorly over the ischial tuberosity required an extensive debridement.using Santyl Left heel appears stable. Using Iodoflex The areas between his first and second toes are not bad however there is spreading erythema up the dorsal aspect of his left foot this looks  like cellulitis again. He is  insensate the erythema is really very brilliant.o Erysipelas He went to see an allergist days ago because he was itching part of this he had lab work done. This showed [name] white count of 15.1 with 70% neutrophils. Hemoglobin of 11.4 and [name] platelet count of 659,000. Last white count we had in Epic was [name] 2-1/2 years ago which was 25.9 but he was ill at the time. He was able to show me some lab work that was done by his primary physician the pattern is about the same. 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 But prompted me to go ahead and do x-rays of both feet and the pelvis rule out osteomyelitis. He also had [name] comprehensive metabolic panel this was reasonably normal his albumin was 3.7 liver function tests BUN/creatinine all normal 05/23/18; x-rays of both his feet from last week were negative for underlying pulmonary abnormality. The x-ray of his pelvis however showed mild irregularity in the left ischial which may represent some early osteomyelitis. The wound in the left ischial continues to get deeper clearly now exposed muscle. Each week necrotic surface material over this area. Whereas the rest of the wounds do not look so bad. The left ischial wound we have been using Santyl and calcium alginate T the left heel surface necrotic debris using Iodoflex o The left lateral leg is still healed Areas on the left dorsal foot and the right dorsal foot are about the same. There is some inflammation on the left which might represent contact dermatitis, fungal dermatitis I am doubtful cellulitis although this looks better than last week 05/30/18; CT scan done at Hospital did not show any osteomyelitis or abscess. Suggested the possibility of underlying cellulitis although I don't see [name] lot of evidence of this at the bedside The wound itself on the left buttock/upper thigh actually looks somewhat better. No debridement Left heel also looks better no debridement  continue Iodoflex Both dorsal first second toe spaces appear better using Lotrisone. Left still required debridement 06/06/18; Intake reported some purulent looking drainage from the left gluteal wound. Using Santyl and calcium alginate Left heel looks better although still [name] nonviable surface requiring debridement The left dorsal foot first/second webspace actually expanding and somewhat deeper. I may consider doing [name] shave biopsy of this area Right dorsal foot first/second webspace appears stable to improved. Using Lotrisone and silver alginate to both these areas 06/13/18 Left gluteal surface looks better. Now separated in the 2 wounds. No debridement required. Still drainage. We'll continue silver alginate Left heel continues to look better with Iodoflex continue this for at least another week Of his dorsal foot wounds the area on the left still has some depth although it looks better than last week. We've been using Lotrisone and silver alginate 06/20/18 Left gluteal continues to look better healthy tissue Left heel continues to look better healthy granulation wound is smaller. He is using Iodoflex and his long as this continues continue the Iodoflex Dorsal right foot looks better unfortunately dorsal left foot does not. There is swelling and erythema of his forefoot. He had minor trauma to this several days ago but doesn't think this was enough to have caused any tissue injury. Foot looks like cellulitis, we have had this problem before 06/27/18 on evaluation today patient appears to be doing [name] little worse in regard to his foot ulcer. 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 Both of which would really be his only options for treating this infection. In the past he has been given and effusion of Orbactiv. This is done very well for him in the past again  it's one time dosing IV antibiotic therapy. Subsequently I do believe this is something we're gonna need to see about doing at this point in time. Currently his other wounds seem to be doing somewhat better in my pinion I'[name] pretty happy in that regard. 07/03/18 on evaluation today patient's wounds actually appear to be doing fairly well. He has been tolerating the dressing changes without complication. All in all he seems to be showing signs of improvement. In regard to the antibiotics he has been dealing with infectious disease since I saw him last week as far as getting this scheduled. In the end [facility]'s [facility] this coming Friday. In the meantime he has been continuing to perform the dressing changes in such as previous. There does not appear to be any evidence of infection worsengin at this time. 07/10/18; Since I last saw this man 2 [name] ago things have actually improved. IV antibiotics of resulted in less forefoot erythema although there is still some present. He is not systemically unwell Left buttock wounds 2 now have no depth there is increased epithelialization Using silver alginate Left heel still requires debridement using Iodoflex Left dorsal foot still with [name] sizable wound about the size of [name] border but healthy granulation Right dorsal foot still with [name] slitlike area using silver alginate 07/18/18; the patient's cellulitis in the left foot is improved in fact I think it is on its way to resolving. Left buttock wounds 2 both look better although the larger one has hypertension granulation we've been using silver alginate Left heel has some thick circumferential redundant skin over the wound edge which will need to be removed today we've been using Iodoflex Left dorsal foot is still [name] sizable wound required debridement using silver alginate The right dorsal foot is just about closed only [name] small open area remains here 07/25/18;  left foot cellulitis is resolved Left buttock wounds 2 both look better. Hyper-granulation on the major area Left heel as some debris over the surface but otherwise looks [name] healthier wound. Using silver collagen Right dorsal foot is just about closed 07/31/18; arrives with our intake nurse worried about purulent drainage from the buttock. We had hyper-granulation here last week His buttock wounds 2 continue to look better Left heel some debris over the surface but measuring smaller. Right dorsal foot unfortunately has openings between the toes Left foot superficial wound looks less aggravated. 08/07/18 Buttock wounds continue to look better although some of her granulation and the larger medial wound. silver alginate Left heel continues to look [name] lot better.silver collagen Left foot superficial wound looks less stable. Requires debridement. He has [name] new wound superficial area on the foot on the lateral dorsal foot. Right foot looks better using silver alginate without Lotrisone 08/14/2018; patient was in the ER last week diagnosed with [name] UTI. He is now on Cefpodoxime and Macrodantin. Buttock wounds continued to be smaller. Using silver alginate Left heel continues to look better using silver collagen Left foot superficial wound looks as though it is improving Right dorsal foot area is just about healed. 08/21/2018; patient is completed  his antibiotics for his UTI. He has 2 open areas on the buttocks. There is still not closed although the surface looks satisfactory. Using silver alginate Left heel continues to improve using silver collagen The bilateral dorsal foot areas which are at the base of his first and second toes/possible tinea pedis are actually stable on the left but worse on the right. The area on the left required debridement of necrotic surface. After debridement I obtained [name] specimen for PCR culture. The right dorsal foot which is been just about healed last week is now  reopened 08/28/2018; culture done on the left dorsal foot showed coag negative staph both staph epidermidis and Lugdunensis. I think this is worthwhile initiating systemic treatment. I will use doxycycline given his long list of allergies. The area on the left heel slightly improved but still requiring debridement. The large wound on the buttock is just about closed whereas the smaller one is larger. Using silver alginate in this area 09/04/2018; patient is completing his doxycycline for the left foot although this continues to be [name] very difficult wound area with very adherent necrotic debris. We are using silver alginate to all his wounds right foot left foot and the small wounds on his buttock, silver collagen on the left heel. 09/11/2018; once again this patient has intense erythema and swelling of the left forefoot. Lesser degrees of erythema in the right foot. He has [name] long list of allergies and intolerances. I will reinstitute doxycycline. 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 Using silver alginate Left heel also looks better using silver collagen Unfortunately both the areas on his feet look worse. The area on the left first second webspace is now gone through to the plantar part of his foot. The area on the left foot anteriorly is irritated with erythema and swelling in the forefoot. 09/25/2018 His wound on the left plantar heel looks better. Using silver collagen The area on the left buttock 2 small remnant areas. One is closed one is still open. Using silver alginate The areas between both his first and second toes look worse. This in spite of long-standing antifungal therapy with ketoconazole and silver alginate which should have antifungal activity He has small areas around his original wound on the left calf one is on the bottom of the original scar tissue and one superiorly both of these are small and superficial but again given wound  history in this site this is worrisome 10/02/2018 Left plantar heel continues to gradually contract using silver collagen Left buttock wound is unchanged using silver alginate The areas on his dorsal feet between his first and second toes bilaterally look about the same. I prescribed clindamycin ointment to see if we can address chronic staph colonization and also the underlying possibility of erythrasma The left lateral lower extremity wound is actually on the lateral part of his ankle. Small open area here. We have been using silver alginate 10/09/2018; Left plantar heel continues to look healthy and contract. No debridement is required Left buttock slightly smaller with [name] tape injury wound just below which was new this week Dorsal feet somewhat improved I have been using clindamycin Left lateral looks lower extremity the actual open area looks worse although [name] lot of this is epithelialized. I am going to change to silver collagen today He has [name] lot more swelling in the right leg although this is not pitting not red and not particularly warm there  is [name] lot of spasm in the right leg usually indicative of people with paralysis of some underlying discomfort. We have reviewed his vascular status from 2017 he had [name] left greater saphenous vein ablation. I wonder about referring him back to vascular surgery if the area on the left leg continues to deteriorate. 10/16/2018 in today for follow-up and management of multiple lower extremity ulcers. His left Buttock wound is much lower smaller and almost closed completely. The wound to the left ankle has began to reopen with Epithelialization and some adherent slough. He has multiple new areas to the left foot and leg. The left dorsal foot without much improvement. Wound present between left great webspace and 2nd toe. Erythema and edema present right leg. Right LE ultrasound obtained on 10/10/18 was negative for DVT . 10/23/2018; Left buttock is closed  over. Still dry macerated skin but there is no open wound. I suspect this is chronic pressure/moisture Left lateral calf is quite [name] bit worse than when I saw this last. There is clearly drainage here he has macerated skin into the left plantar heel. We will change the primary dressing to alginate Left dorsal foot has some improvement in overall wound area. Still using clindamycin and silver alginate Right dorsal foot about the same as the left using clindamycin and silver alginate The erythema in the right leg has resolved. He is DVT rule out was negative Left heel pressure area required debridement although the wound is smaller and the surface is health 10/26/2018 The patient came back in for his nurse check today predominantly because of the drainage coming out of the left lateral leg with [name] recent reopening of his original wound on the left lateral calf. He comes in today with [name] large amount of surrounding erythema around the wound extending from the calf into the ankle and even in the area on the dorsal foot. He is not systemically unwell. He is not febrile. Nevertheless this looks like cellulitis. We have been using silver alginate to the area. I changed him to [name] regular visit and I am going to prescribe him doxycycline. The rationale here is [name] long list of medication intolerances and [name] history of MRSA. I did not see anything that I thought would provide [name] valuable culture 10/30/2018 Follow-up from his appointment 4 days ago with really an extensive area of cellulitis in the left calf left lateral ankle and left dorsal foot. I put him on doxycycline. He has [name] long list of medication allergies which are true allergy reactions. 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 In any case he is [name] lot better today. The erythema especially in the anterior and lateral left calf is better. He still has left ankle erythema. He also is  complaining about increasing edema in the right leg we have only been using Kerlix Coban and he has been doing the wraps at home. Finally he has [name] spotty rash on the medial part of his upper left calf which looks like folliculitis or perhaps wrap occlusion type injury. Small superficial macules not pustules 11/06/18 patient arrives today with again [name] considerable degree of erythema around the wound on the left lateral calf extending into the dorsal ankle and dorsal foot. This is [name] lot worse than when I saw this last week. He is on doxycycline really with not [name] lot of improvement. He has not been systemically unwell Wounds on the; left heel actually looks  improved. Original area on the left foot and proximity to the first and second toes looks about the same. He has superficial areas on the dorsal foot, anterior calf and then the reopening of his original wound on the left lateral calf which looks about the same The only area he has on the right is the dorsal webspace first and second which is smaller. He has [name] large area of dry erythematous skin on the left buttock small open area here. 11/13/2018; the patient arrives in much better condition. The erythema around the wound on the left lateral calf is [name] lot better. Not sure whether this was the clindamycin or the TCA and ketoconazole or just in the improvement in edema control [stasis dermatitis]. In any case this is [name] lot better. The area on the left heel is very small and just about resolved using silver collagen we have been using silver alginate to the areas on his dorsal feet 11/20/2018; his wounds include the left lateral calf, left heel, dorsal aspects of both feet just proximal to the first second webspace. He is stable to slightly improved. I did not think any changes to his dressings were going to be necessary 11/27/2018 he has [name] reopening on the left buttock which is surrounded by what looks like tinea or perhaps some other form of dermatitis.  The area on the left dorsal foot has some erythema around it I have marked this area but I am not sure whether this is cellulitis or not. Left heel is not closed. Left calf the reopening is really slightly longer and probably worse 1/13; in general things look better and smaller except for the left dorsal foot. Area on the left heel is just about closed, left buttock looks better only [name] small wound remains in the skin looks better [using Lotrisone] 1/20; the area on the left heel only has [name] few remaining open areas here. Left lateral calf about the same in terms of size, left dorsal foot slightly larger right lateral foot still not closed. The area on the left buttock has no open wound and the surrounding skin looks [name] lot better 1/27; the area on the left heel is closed. Left lateral calf better but still requiring extensive debridements. The area on his left buttock is closed. He still has the open areas on the left dorsal foot which is slightly smaller in the right foot which is slightly expanded. We have been using Iodoflex on these areas as well 2/3; left heel is closed. Left lateral calf still requiring debridement using Iodoflex there is no open area on his left buttock however he has dry scaly skin over [name] large area of this. Not really responding well to the Lotrisone. Finally the areas on his dorsal feet at the level of the first second webspace are slightly smaller on the right and about the same on the left. Both of these vigorously debrided with Anasept and gauze 2/10; left heel remains closed he has dry erythematous skin over the left buttock but there is no open wound here. Left lateral leg has come in and with. Still requiring debridement we have been using Iodoflex here. Finally the area on the left dorsal foot and right dorsal foot are really about the same extremely dry callused fissured areas. He does not yet have [name] dermatology appointment 2/17; left heel remains closed. He has [name] new  open area on the left buttock. The area on the left lateral calf is bigger longer and still  covered in necrotic debris. No major change in his foot areas bilaterally. I am awaiting for [name] dermatologist to look on this. We have been using ketoconazole I do not know that this is been doing any good at all. 2/24; left heel remains closed. The left buttock wound that was new reopening last week looks better. The left lateral calf appears better also although still requires debridement. The major area on his foot is the left first second also requiring debridement. We have been putting Prisma on all wounds. I do not believe that the ketoconazole has done too much good for his feet. He will use Lotrisone I am going to give him [name] 2-week course of terbinafine. We still do not have [name] dermatology appointment 3/2 left heel remains closed however there is skin over bone in this area I pointed this out to him today. The left buttock wound is epithelialized but still does not look completely stable. The area on the left leg required debridement were using silver collagen here. With regards to his feet we changed to Lotrisone last week and silver alginate. 3/9; left heel remains closed. Left buttock remains closed. The area on the right foot is essentially closed. The left foot remains unchanged. Slightly smaller on the left lateral calf. Using silver collagen to both of these areas 3/16-Left heel remains closed. Area on right foot is closed. Left lateral calf above the lateral malleolus open wound requiring debridement with easy bleeding. Left dorsal wound proximal to first toe also debrided. Left ischial area open new. Patient has been using Prisma with wrapping every 3 days. Dermatology appointment is apparently tomorrow.Patient has completed his terbinafine 2-week course with some apparent improvement according to him, there is still flaking and dry skin in his foot on the left 3/23; area on the right foot is  reopened. The area on the left anterior foot is about the same still [name] very necrotic adherent surface. He still has the area on the left leg and reopening is on the left buttock. He apparently saw dermatology although I do not have [name] note. According to the patient who is usually fairly well informed they did not have any good ideas. Put him on oral terbinafine which she is been on before. 3/30; using silver collagen to all wounds. Apparently his dermatologist put him on doxycycline and rifampin presumably some culture grew staph. I do not have this result. He remains on terbinafine although I have used terbinafine on him before 4/6; patient has had [name] fairly substantial reopening on the right foot between the first and second toes. He is finished his terbinafine and I believe is on doxycycline and rifampin still as prescribed by dermatology. We have been using silver collagen to all his wounds although the patient reports that he thinks silver alginate does better on the wounds on his buttock. 4/13; the area on his left lateral calf about the same size but it did not require debridement. Left dorsal foot just proximal to the webspace between the first and second toes is about the same. Still nonviable surface. I note some superficial bronze discoloration of the dorsal part of his foot Right dorsal foot just proximal to the first and second toes also looks about the same. I still think there may be the same discoloration I noted above on the left Left buttock wound looks about the same 4/20; left lateral calf appears to be gradually contracting using silver collagen. 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 The left dorsal foot wound is debrided of tightly adherent necrotic debris and really cleans up quite nicely. The right area is worse with expansion. I did not debride this it is now over the base of the second toe The area on his left  buttock is smaller no debridement is required using silver collagen 5/4; left calf continues to make good progress. He arrives with erythema around the wounds on his dorsal foot which even extends to the plantar aspect. Very concerning for coexistent infection. He is finished the erythromycin I gave him for possible erythrasma this does not seem to have helped. The area on the left foot is about the same base of the dorsal toes Is area on the buttock looks improved on the left 5/11; left calf and left buttock continued to make good progress. Left foot is about the same to slightly improved. Major problem is on the right foot. He has not had an x-ray. Deep tissue culture I did last week showed both Enterobacter and E. coli. I did not change the doxycycline I put him on empirically although neither 1 of these were plated to doxycycline. He arrives today with the erythema looking worse on both the dorsal and plantar foot. Macerated skin on the bottom of the foot. he has not been systemically unwell 5/18-Patient returns at 1 week, left calf wound appears to be making some progress, left buttock wound appears slightly worse than last time, left foot wound looks slightly better, right foot redness is marginally better. X-ray of both feet show no air or evidence of osteomyelitis. Patient is finished his Omnicef and terbinafine. He continues to have macerated skin on the bottom of the left foot as well as right 5/26; left calf wound is better, left buttock wound appears to have multiple small superficial open areas with surrounding macerated skin. X-rays that I did last time showed no evidence of osteomyelitis in either foot. He is finished cefdinir and doxycycline. I do not think that he was on terbinafine. He continues to have [name] large superficial open area on the right foot anterior dorsal and slightly between the first and second toes. I did send him to dermatology 2 months ago or so wondering about  whether they would do [name] fungal scraping. I do not believe they did but did do [name] culture. We have been using silver alginate to the toe areas, he has been using antifungals at home topically either ketoconazole or Lotrisone. We are using silver collagen on the left foot, silver alginate on the right, silver collagen on the left lateral leg and silver alginate on the left buttock 6/1; left buttock area is healed. We have the left dorsal foot, left lateral leg and right dorsal foot. We are using silver alginate to the areas on both feet and silver collagen to the area on his left lateral calf 6/8; the left buttock apparently reopened late last week. He is not really sure how this happened. He is tolerating the terbinafine. Using silver alginate to all wounds 6/15; left buttock wound is larger than last week but still superficial. Came in the clinic today with [name] report of purulence from the left lateral leg I did not identify any infection Both areas on his dorsal feet appear to be better. He is tolerating the terbinafine. Using silver alginate to all wounds 6/22; left buttock is about the same this week, left calf quite [name] bit better. His left foot is about the same however  he comes in with erythema and warmth in the right forefoot once again. Culture that I gave him in the beginning of May showed Enterobacter and E. coli. I gave him doxycycline and things seem to improve although neither 1 of these organisms was specifically plated. 6/29; left buttock is larger and dry this week. Left lateral calf looks to me to be improved. Left dorsal foot also somewhat improved right foot completely unchanged. The erythema on the right foot is still present. He is completing the Ceftin dinner that I gave him empirically [see discussion above.) 7/6 - All wounds look to be stable and perhaps improved, the left buttock wound is slightly smaller, per patient bleeds easily, completed ceftin, the right foot redness is  less, he is on terbinafine 7/13; left buttock wound about the same perhaps slightly narrower. Area on the left lateral leg continues to narrow. Left dorsal foot slightly smaller right foot about the same. We are using silver alginate on the right foot and Hydrofera Blue to the areas on the left. Unna boot on the left 2 layer compression on the right 7/20; left buttock wound absolutely the same. Area on lateral leg continues to get better. Left dorsal foot require debridement as did the right no major change in the 7/27; left buttock wound the same size necrotic debris over the surface. The area on the lateral leg is closed once again. His left foot looks better right foot about the same although there is some involvement now of the posterior first second toe area. He is still on terbinafine which I have given him for [name] month, not certain [name] centimeter major change 06/25/19-All wounds appear to be slightly improved according to report, left buttock wound looks clean, both foot wounds have minimal to no debris the right dorsal foot has minimal slough. We are using Hydrofera Blue to the left and silver alginate to the right foot and ischial wound. 8/10-Wounds all appear to be around the same, the right forefoot distal part has some redness which was not there before, however the wound looks clean and small. Ischial wound looks about the same with no changes 8/17; his wound on the left lateral calf which was his original chronic venous insufficiency wound remains closed. Since I last saw him the areas on the left dorsal foot right dorsal foot generally appear better but require debridement. The area on his left initial tuberosity appears somewhat larger to me perhaps hyper granulated and bleeds very easily. We have been using Hydrofera Blue to the left dorsal foot and silver alginate to everything else 8/24; left lateral calf remains closed. The areas on his dorsal feet on the webspace of the first and  second toes bilaterally both look better. The area on the left buttock which is the pressure ulcer stage II slightly smaller. I change the dressing to Hydrofera Blue to all areas 8/31; left lateral calf remains closed. The area on his dorsal feet bilaterally look better. Using Hydrofera Blue. Still requiring debridement on the left foot. No change in the left buttock pressure ulcers however 9/14; left lateral calf remains closed. Dorsal feet look quite [name] bit better than 2 [name] ago. Flaking dry skin also [name] lot better with the ammonium lactate I gave him 2 [name] ago. The area on the left buttock is improved. He states that his Roho cushion developed [name] leak and he is getting [name] new one, in the interim he is offloading this vigorously 9/21; left calf remains closed. Left  heel which was [name] possible DTI looks better this week. He had macerated tissue around the left dorsal foot right foot looks satisfactory and improved left buttock wound. I changed his dressings to his feet to silver alginate bilaterally. Continuing Hydrofera Blue on the left buttock. 9/28 left calf remains closed. Left heel did not develop anything [possible DTI] dry flaking skin on the left dorsal foot. Right foot looks satisfactory. Improved left buttock wound. We are using silver alginate on his feet Hydrofera Blue on the buttock. I have asked him to go back to the Lotrisone on his feet including the wounds and surrounding areas 10/5; left calf remains closed. The areas on the left and right feet about the same. [name] lot of this is epithelialized however debris over the remaining open areas. He is using Lotrisone and silver alginate. The area on the left buttock using Hydrofera Blue 10/26. Patient has been out for 3 [name] secondary to Covid concerns. He tested negative but I think his wife tested positive. He comes in today with the left foot substantially worse, right foot about the same. Even more concerning he states that the area on  his left buttock closed over but then reopened and is considerably deeper in one aspect than it was before [stage III wound] 11/2; left foot really about the same as last week. Quarter sized wound on the dorsal foot just proximal to the first second toes. Surrounding erythema with areas of denuded epithelium. This is not really much different looking. Did not look like cellulitis this time however. Right foot area about the same.. We have been using silver alginate alginate on his toes Left buttock still substantial irritated skin around the wound which I think looks somewhat better. We have been using Hydrofera Blue here. 11/9; left foot larger than last week and [name] very necrotic surface. Right foot I think is about the same perhaps slightly smaller. Debris around the circumference also addressed. Unfortunately on the left buttock there is been [name] decline. Satellite lesions below the major wound distally and now [name] an additional one posteriorly we have been using Hydrofera Blue but I think this is [name] pressure issue 11/16; left foot ulcer dorsally again [name] very adherent necrotic surface. Right foot is about the same. Not much change in the pressure ulcer on his left buttock. 11/30; left foot ulcer dorsally basically the same as when I saw him 2 [name] ago. Very adherent fibrinous debris on the wound surface. Patient reports [name] lot of drainage as well. 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 We have been using Iodoflex, patient changed back to alginate because of the drainage. Area on his right dorsal foot really looks benign with [name] healthier surface certainly [name] lot better than on the left. Left buttock wounds all improved using Hydrofera Blue 12/7; left dorsal foot again no improvement. Tightly adherent debris. PCR culture I did last week only showed likely skin contaminant. I have gone ahead and done [name] punch biopsy of this which is about the last thing in  terms of investigations I can think to do. He has known venous insufficiency and venous hypertension and this could be the issue here. 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 We have been using silver alginate to both feet. I change the one on the left today to Sorbact and silver alginate to his other 2 wounds 12/28; the patient arrives with the following problems; Major issue is the dorsal left foot which continues to be [name] larger deeper wound area. Still with [name] completely nonviable surface Paradoxically the area mirror image on the right on the right dorsal foot appears to be getting better. He had some loss of dry denuded skin from the lower part of his original wound on the left lateral calf. Some of this area looked [name] little vulnerable and for this reason we put him in wrap that on this side this week The area on his left buttock is larger. He still has the erythematous circular area which I think is [name] combination of pressure, sweat. This does not look like cellulitis or fungal dermatitis 11/26/2019; -Dorsal left foot large open wound with depth. Still debris over the surface. Using Sorbact The area on the dorsal right foot paradoxically has closed over He has [name] reopening on the left ankle laterally at the base of his original wound that extended up into the calf. This appears clean. The left buttock wound is smaller but with very adherent necrotic debris over the surface. We have been using silver alginate here as well The patient had arterial studies done in 2017. He had biphasic waveforms at the dorsalis pedis and posterior tibial bilaterally. ABI in the left was 1.17. Digit waveforms were dampened. He has slight spasticity in  the great toes I do not think [name] TBI would be possible 1/11; the patient comes in today with [name] sizable reopening between the first and second toes on the right. This is not exactly in the same location where we have been treating wounds previously. According to our intake nurse this was actually fairly deep but 0.6 cm. The area on the left dorsal foot looks about the same the surface is somewhat cleaner using Sorbact, his MRI is in 2 days. We have not managed yet to get arterial studies. The new reopening on the left lateral calf looks somewhat better using alginate. The left buttock wound is about the same using alginate 1/18; the patient had his ARTERIAL studies which were quite normal. ABI in the right at 1.13 with triphasic/biphasic waveforms on the left ABI 1.06 again with triphasic/biphasic waveforms. It would not have been possible to have done [name] toe brachial index because of spasticity. We have been using Sorbac to the left foot alginate to the rest of his wounds on the right foot left lateral calf and left buttock 1/25; arrives in clinic with erythema and swelling of the left forefoot worse over the first MTP area. This extends laterally dorsally and but also posteriorly. Still has an area on the left lateral part of the lower part of his calf wound it is eschared and clearly not closed. Area on the left buttock still with surrounding irritation and erythema. Right foot surface wound dorsally. The area between the right and first and second toes appears better. 2/1; The left foot wound is about the same. Erythema slightly better I gave him [name] week of doxycycline empirically Right foot wound is more extensive extending between the toes to the plantar surface Left lateral calf really no open surface on the inferior part of his original wound however the entire area still looks vulnerable Absolutely no improvement in the left buttock wound required debridement. 2/8; the  left foot is about the  same. Erythema is slightly improved I gave him clindamycin last week. Right foot looks better he is using Lotrimin and silver alginate He has [name] breakdown in the left lateral calf. Denuded epithelium which I have removed Left buttock about the same were using Hydrofera Blue 2/15; left foot is about the same there is less surrounding erythema. Surface still has tightly adherent debris which I have debriding however not making any progress Right foot has [name] substantial wound on the medial right second toe between the first and second webspace. Still an open area on the left lateral calf distal area. Buttock wound is about the same 2/22; left foot is about the same less surrounding erythema. Surface has adherent debris. Polymen Ag Right foot area significant wound between the first and second toes. We have been using silver alginate here Left lateral leg polymen Ag at the base of his original venous insufficiency wound Left buttock some improvement here 3/1; Right foot is deteriorating in the first second toe webspace. Larger and more substantial. We have been using silver alginate. Left dorsal foot about the same markedly adherent surface debris using PolyMem Ag Left lateral calf surface debris using PolyMem AG Left buttock is improved again using PolyMem Ag. He is completing his terbinafine. The erythema in the foot seems better. He has been on this for 2 [name] 3/8; no improvement in any wound area in fact he has [name] small open area on the dorsal midfoot which is new this week. He has not gotten his foot x-rays yet 3/15; his x-rays were both negative for osteomyelitis of both feet. No major change in any of his wounds on the extremities however his buttock wounds are better. We have been using polymen on the buttocks, left lower leg. Iodoflex on the left foot and silver alginate on the right 3/22; arrives in clinic today with the 2 major issues are the improvement in the left dorsal foot wound which  for once actually looks healthy with [name] nice healthy wound surface without debridement. Using Iodoflex here. Unfortunately on the left lateral calf which is in the distal part of his original wound he came to the clinic here for there was purulent drainage noted some increased breakdown scattered around the original area and [name] small area proximally. We we are using polymen here will change to silver alginate today. His buttock wound on the left is better and I think the area on the right first second toe webspace is also improved 3/29; left dorsal foot looks better. Using Iodoflex. Left ankle culture from deterioration last time grew E. coli, Enterobacter and Enterococcus. I will give him [name] course of cefdinir although that will not cover Enterococcus. The area on the right foot in the webspace of the first and second toe lateral first toe looks better. The area on his buttock is about healed Vascular appointment is on April 21. This is to look at his venous system vis--vis continued breakdown of the wounds on the left including the left lateral leg and left dorsal foot he. He has had previous ablations on this side 4/5; the area between the right first and second toes lateral aspect of the first toe looks better. Dorsal aspect of the left first toe on the left foot also improved. Unfortunately the left lateral lower leg is larger and there is [name] second satellite wound superiorly. The usual superficial abrasions on the left buttock overall better but certainly not closed 4/12; the area  between the right first and second toes is improved. Dorsal aspect of the left foot also slightly smaller with [name] vibrant healthy looking surface. No real change in the left lateral leg and the left buttock wound is healed He has an unaffordable co-pay for Apligraf. Appointment with vein and vascular with regards to the left leg venous part of the circulation is on 4/21 4/19; we continue to see improvement in all wound  areas. Although this is minor. He has his vascular appointment on 4/21. The area on the left buttock has not reopened although right in the center of this area the skin looks somewhat threatened 4/26; the left buttock is unfortunately reopened. In general his left dorsal foot has [name] healthy surface and looks somewhat smaller although it was not measured as such. The area between his first and second toe webspace on the right as [name] small wound against the first toe. The patient saw vascular surgery. The real question I was asking was about the small saphenous vein on the left. He has previously ablated left greater saphenous vein. Nothing further was commented on on the left. Right greater saphenous vein without reflux at the saphenofemoral junction or proximal thigh there was no indication for ablation of the right greater saphenous vein duplex was negative for DVT bilaterally. They did not think there was anything from [name] vascular surgery point of view that could be offered. They ABIs within normal limits 5/3; only small open area on the left buttock. The area on the left lateral leg which was his original venous reflux is now 2 wounds both which look clean. We are using Iodoflex on the left dorsal foot which looks healthy and smaller. He is down to [name] very tiny area between the right first and second toes, using silver alginate 5/10; all of his wounds appear better. We have much better edema control in 4 layer compression on the left. This may be the factor that is allowing the left foot and left lateral calf to heal. He has external compression garments at home 04/14/20-All of his wounds are progressing well, the left forefoot is practically closed, left ischium appears to be about the same, right toe webspace is also smaller. The left lateral leg is about the same, continue using Hydrofera Blue to this, silver alginate to the ischium, Iodoflex to the toe space on the right 6/7; most of his wounds  outside of the left buttock are doing well. The area on the left lateral calf and left dorsal foot are smaller. The area on the right foot in between the first and second toe webspace is barely visible although he still says there is some drainage here is the only reason I did not heal this out. Unfortunately the area on the left buttock almost looks like he has [name] skin tear from tape. He has open wound and then [name] large flap of skin that we are trying to get adherence over an area just next to the remaining wound 6/21; 2 week follow-up. I believe is been here for nurse visits. Miraculously the area between his first and second toes on the left dorsal foot is closed over. Still open on the right first second web space. The left lateral calf has 2 open areas. Distally this is more superficial. The proximal area had [name] little more depth and required debridement of adherent necrotic material. His buttock wound is actually larger we have been using silver alginate here 6/28; the patient's area on the left  foot remains closed. Still open wet area between the first and second toes on the right and also extending into the plantar aspect. We have been using silver alginate in this location. He has 2 areas on the left lower leg part of his original long wounds which I think are better. We have been using Hydrofera Blue here. Hydrofera Blue to the left buttock which is stable 7/12; left foot remains closed. Left ankle is closed. May be [name] small area between his right first and second toes the only truly open area is on the left buttock. We have been using Hydrofera Blue here 7/19; patient arrives with marked deterioration especially in the left foot and ankle. We did not put him in [name] compression wrap on the left last week in fact he wore his juxta lite stockings on either side although he does not have an underlying stocking. He has [name] reopening on the left dorsal foot, left lateral ankle and [name] new area on the right  dorsal ankle. More worrisome is the degree of erythema on the left foot extending on the lateral foot into the lateral lower leg on the left 7/26; the patient had erythema and drainage from the lateral left ankle last week. Culture of this grew MRSA resistant to doxycycline and clindamycin which are the 2 antibiotics we usually use with this patient who has multiple antibiotic allergies including linezolid, trimethoprim sulfamethoxazole. I had give him an empiric doxycycline and he comes in the area certainly looks somewhat better although it is blotchy in his lower leg. He has not been systemically unwell. He has had areas on the left dorsal foot which is [name] reopening, chronic wounds on the left lateral ankle. Both of these I think are secondary to chronic venous insufficiency. The area between his first and second toes is closed as far as I can tell. He had [name] new wrap injury on the right dorsal ankle last week. Finally he has an area on the left buttock. We have been using silver alginate to everything except the left buttock we are using Hydrofera Blue 06/30/20-Patient returns at 1 week, has been given [name] sample dose pack of NUZYRA which is [name] tetracycline derivative [omadacycline], patient has completed those, we have been using silver alginate to almost all the wounds except the left ischium where we are using Hydrofera Blue all of them look better 8/16; since I last saw the patient he has been doing well. The area on the left buttock, left lateral ankle and left foot are all closed today. He has completed the [place] I gave him last time and tolerated this well. He still has open areas on the right dorsal ankle and in the right first second toe area which we are using silver alginate. 8/23; we put him in his bilateral external compression stockings last week as he did not have anything open on either leg except for concerning area between the right first and second toe. He comes in today with an area on  the left dorsal foot slightly more proximal than the original wound, the left lateral foot but this is actually [name] continuation of the area he had on the left lateral ankle from last time. As well he is opened up on the left buttock again. 8/30; comes in today with things looking [name] lot better. The area on the left lower ankle has closed down as has the left foot but with eschar in both areas. The area on the dorsal right ankle  is also epithelialized. Very little remaining of the left buttock wound. We have been using silver alginate on all wound areas 9/13; the area in the first second toe webspace on the right has fully epithelialized. He still has some vulnerable epithelium on the right and the ankle and the dorsal foot. He notes weeping. He is using his juxta lite stocking. On the left again the left dorsal foot is closed left lateral ankle is closed. We went to the juxta lite stocking here as well. Still vulnerable in the left buttock although only 2 small open areas remain here 9/27; 2-week follow-up. We did not look at his left leg but the patient says everything is closed. He is [name] bit disturbed by the amount of edema in his left foot he is using juxta lite stockings but asking about over the toes stockings which would be 30/40, will talk to him next time. According to him there is no open wound on either the left foot or the left ankle/calf He has an open area on the dorsal right calf which I initially point [name] wrap injury. He has superficial remaining wound on the left ischial tuberosity been using silver alginate although he says this sticks to the wound 10/5; we gave him 2-week follow-up but he called yesterday expressing some concerns about his right foot right ankle and the left buttock. He came in early. There is still no open areas on the left leg and that still in his juxta lite stocking 10/11; he only has 1 small area on the left buttock that remains measuring millimeters 1 mm. Still has  the same irritated skin in this area. We recommended zinc oxide when this eventually closes and pressure relief is meticulously is he can do this. He still has an area on the dorsal part of his right first through third toes which is [name] bit irritated and still open and on the dorsal ankle near the crease of the ankle. We have been using silver alginate and using his own stocking. He has nothing open on the left leg or foot 10/25; 2-week follow-up. Not nearly as good on the left buttock as I was hoping. For open areas with 5 looking threatened small. He has the erythematous irritated chronic skin in this area. 1 area on the right dorsal ankle. He reports this area bleeds easily Right dorsal foot just proximal to the base of his toes We have been using silver alginate. 11/8; 2-week follow-up. Left buttock is about the same although I do not think the wounds are in the same location we have been using silver alginate. I have asked him to use zinc oxide on the skin around the wounds. He still has [name] small area on the right dorsal ankle he reports this bleeds easily Right dorsal foot just proximal to the base of the toes does not have anything open although the skin is very dry and scaly He has [name] new opening on the nailbed of the left great toe. Nothing on the left ankle 11/29; 3-week follow-up. Left buttock has 2 open areas. And washing of these wounds today started bleeding easily. Suggesting very friable tissue. We have been using silver alginate. Right dorsal ankle which I thought was initially [name] wrap injury we have been using silver alginate. Nothing open between the toes that I can see. He states the area on the left dorsal toe nailbed healed after the last visit in 2 or 3 days 12/13; 3-week follow-up. His left buttock  now has 3 open areas but the original 2 areas are smaller using polymen here. Surrounding skin looks better. The right dorsal ankle is closed.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 wounds aggressively debridement although the base of this looks considerably better Apligraf #1. He had ablation surgery on Monday I'll need to research these records. We only have approval for four Apligraf's 04/22/16; the patient is here for [name] wound check [Apligraf last week] intake nurse concerned about erythema around the wounds. Apparently [name] significant degree of drainage. The patient has chronic venous inflammation which I think accounts for most of this however I was asked to look at this today 04/26/16; the patient came back for check of possible cellulitis in his left foot however the Apligraf dressing was inadvertently removed therefore we elected to prep the wound for [name] second Apligraf. I put him on doxycycline on 6/1 the erythema in the foot 05/03/16 we did not remove the dressing from the superior wound as this is where I put all of his last Apligraf. Surface debridement done with [name] curette of the lower wound which looks very healthy. The area on the left foot also looks quite satisfactory at the dorsal artery at the first and second toes 05/10/16; continue Apligraf to this. Her wound, Hydrafera to the lower wound. He has [name] new area on the right second toe. Left dorsal foot firstoosecond toe also looks improved 05/24/16; wound dimensions must be smaller I was able to use Apligraf to all 3 remaining wound  areas. 06/07/16 patient's last Apligraf was 2 [name] ago. He arrives today with the 2 wounds on his lateral left leg joined together. This would have to be seen as [name] negative. He also has [name] small wound in his first and second toe on the left dorsally with quite [name] bit of surrounding erythema in the first second and third toes. This looks to be infected or inflamed, very difficult clinical call. 06/21/16: lateral left leg combined wounds. Adherent surface slough area on the left dorsal foot  at roughly the fourth toe looks improved 07/12/16; he now has [name] single linear wound on the lateral left leg. This does not look to be [name] lot changed from when I lost saw this. The area on his dorsal left foot looks considerably better however. 08/02/16; no major change in the substantial area on his left lateral leg since last time. We have been using Hydrofera Blue for [name] prolonged period of time now. The area on his left foot is also unchanged from last review 07/19/16; the area on his dorsal foot on the left looks considerably smaller. He is beginning to have significant rims of epithelialization on the lateral left leg wound. This also looks better. 08/05/16; the patient came in for [name] nurse visit today. Apparently the area on his left lateral leg looks better and it was wrapped. However in general discussion the patient noted [name] new area on the dorsal aspect of his right second toe. The exact etiology of this is unclear but likely relates to pressure. 08/09/16 really the area on the left lateral leg did not really look that healthy today perhaps slightly larger and measurements. The area on his dorsal right second toe is improved also the left foot wound looks stable to improved 08/16/16; the area on the last lateral leg did not change any of dimensions. Post debridement with [name] curet the area looked better. Left foot wound improved and the area on the dorsal right second toe is improved 08/23/16; the area on the left  lateral leg may be slightly smaller both in terms of length and width. Aggressive debridement with [name] curette afterwards the tissue appears healthier. Left foot wound appears improved in the area on the dorsal right second toe is improved 08/30/16 patient developed [name] fever over the weekend and was seen in an urgent care. Felt to have [name] UTI and put on doxycycline. He has been since changed over the phone to [facility]. After we took off the wrap on his right leg today the leg is swollen warm and erythematous, probably more likely the source of the fever 09/06/16; have been using collagen to the major left leg wound, silver alginate to the area on his anterior foot/toes 09/13/16; the areas on his anterior foot/toes on both sides appear to be virtually closed. Extensive wound on the left lateral leg perhaps slightly narrower but each visit still covered an adherent surface slough 09/16/16 patient was in for his usual Thursday nurse visit however the intake nurse noted significant erythema of his dorsal right foot. He is also running [name] low- grade fever and having increasing spasms in the right leg 09/20/16 here for cellulitis involving his right great toes and forefoot. This is [name] lot better. Still requiring debridement on his left lateral leg. Santyl direct says he needs prior authorization. Therefore his wife cannot change this at home 09/30/16; the patient's extensive area on the left lateral calf and ankle perhaps somewhat better. Using Santyl. The area on the left toes is healed and I think the area on his right dorsal foot is healed as well. There is no cellulitis or venous inflammation involving the right leg. He is going to need compression stockings here. 10/07/16; the patient's extensive wound on the left lateral calf and ankle does not measure any differently however there appears to be less adherent surface slough using Santyl and aggressive weekly debridements 10/21/16; no major change in the  area on the left lateral calf. Still the same measurement still very difficult to  debridement adherent slough and nonviable subcutaneous tissue. This is not really been helped by several [name] of Santyl. Previously for 2 [name] I used Iodoflex for [name] short period. [name] prolonged course of Hydrofera Blue didn't really help. I'[name] not sure why I only used 2 [name] of Iodoflex on this there is no evidence of surrounding infection. He has [name] small area on the right second toe which looks as though it's progressing towards closure 10/28/16; the wounds on his toes appear to be closed. No major change in the left lateral leg wound although the surface looks somewhat better using Iodoflex. He has had previous arterial studies that were normal. He has had reflux studies and is status post ablation although I don't have any exact notes on which vein was ablated. I'll need to check the surgical record 11/04/16; he's had [name] reopening between the first and second toe on the left and right. No major change in the left lateral leg wound. There is what appears to be cellulitis of the left dorsal foot 11/18/16 the patient was hospitalized initially in [place] and then subsequently transferred to [facility] long and was admitted there from 11/09/16 through 11/12/16. He had developed progressive cellulitis on the right leg in spite of the doxycycline I gave him. I'd spoken to the hospitalist in [place] who was concerned about continuing leukocytosis. CT scan is what I suggested this was done which showed soft tissue swelling without evidence of osteomyelitis or an underlying abscess blood cultures were negative. At [facility] he was treated with vancomycin and Primaxin and then add an infectious disease consult. He was transitioned to Ceftaroline. He has been making progressive improvement. Overall [name] severe cellulitis of the right leg. He is been using silver alginate to her original wound on the left leg. The wounds in his toes on  the right are closed there is [name] small open area on the base of the left second toe 11/26/15; the patient's right leg is much better although there is still some edema here this could be reminiscent from his severe cellulitis likely on top of some degree of lymphedema. His left anterior leg wound has less surface slough as reported by her intake nurse. Small wound at the base of the left second toe 12/02/16; patient's right leg is better and there is no open wound here. His left anterior lateral leg wound continues to have [name] healthy-looking surface. Small wound at the base of the left second toe however there is erythema in the left forefoot which is worrisome 12/16/16; is no open wounds on his right leg. We took measurements for stockings. His left anterior lateral leg wound continues to have [name] healthy-looking surface. I'[name] not sure where we were with the Apligraf run through his insurance. We have been using Iodoflex. He has [name] thick eschar on the left first second toe interface, I suspect this may be fungal however there is no visible open 12/23/16; no open wound on his right leg. He has 2 small areas left of the linear wound that was remaining last week. We have been using Prisma, I thought I have disclosed this week, we can only look forward to next week 01/03/17; the patient had concerning areas of erythema last week, already on doxycycline for UTI through his primary doctor. The erythema is absolutely no better there is warmth and swelling both medially from the left lateral leg wound and also the dorsal left foot. 01/06/17- Patient is here for follow-up evaluation of his left lateral  leg ulcer and bilateral feet ulcers. He is on oral antibiotic therapy, tolerating that. Nursing staff and the patient states that the erythema is improved from Monday. 01/13/17; the predominant left lateral leg wound continues to be problematic. I had put Apligraf on him earlier this month once. However he  subsequently developed what appeared to be an intense cellulitis around the left lateral leg wound. I gave him Dalvance I think on 2/12 perhaps 2/13 he continues on cefdinir. The erythema is still present but the warmth and swelling is improved. I am hopeful that the cellulitis part of this control. I wouldn't be surprised if there is an element of venous inflammation as well. 01/17/17. The erythema is present but better in the left leg. His left lateral leg wound still does not have [name] viable surface buttons certain parts of this long thin wound it appears like there has been improvement in dimensions. 01/20/17; the erythema still present but much better in the left leg. I'[name] thinking this is his usual degree of chronic venous inflammation. The wound on the left leg looks somewhat better. Is less surface slough 01/27/17; erythema is back to the chronic venous inflammation. The wound on the left leg is somewhat better. I am back to the point where I like to try an Apligraf once again 02/10/17; slight improvement in wound dimensions. Apligraf #2. He is completing his doxycycline 02/14/17; patient arrives today having completed doxycycline last Thursday. This was supposed to be [name] nurse visit however once again he hasn't tense erythema from the medial part of his wound extending over the lower leg. Also erythema in his foot this is roughly in the same distribution as last time. He has baseline chronic venous inflammation however this is [name] lot worse than the baseline I have learned to accept the on him is baseline inflammation 02/24/17- patient is here for follow-up evaluation. He is tolerating compression therapy. His voicing no complaints or concerns he is here anticipating an Apligraf 03/03/17; he arrives today with an adherent necrotic surface. I don't think this is surface is going to be amenable for Apligraf's. The erythema around his wound and on the left dorsal foot has resolved he is off  antibiotics 03/10/17; better-looking surface today. I don't think he can tolerate Apligraf's. He tells me he had [name] wound VAC after [name] skin graft years ago to this area and they had difficulty with [name] seal. The erythema continues to be stable around this some degree of chronic venous inflammation but he also has recurrent cellulitis. We have been using Iodoflex 03/17/17; continued improvement in the surface and may be small changes in dimensions. Using Iodoflex which seems the only thing that will control his surface 03/24/17- He is here for follow up evaluation of his LLE lateral ulceration and ulcer to right dorsal foot/toe space. He is voicing no complaints or concerns, He is tolerating compression wrap. 03/31/17 arrives today with [name] much healthier looking wound on the left lower extremity. We have been using Iodoflex for [name] prolonged period of time which has for the first time prepared and adequate looking wound bed although we have not had much in the way of wound dimension improvement. He also has [name] small wound between the first and second toe on the right 04/07/17; arrives today with [name] healthy-looking wound bed and at least the top 50% of this wound appears to be now her. No debridement was required I have changed him to [facility] last week after prolonged Iodoflex. He  did not do well with Apligraf's. We've had [name] re-opening between the first and second toe on the right 04/14/17; arrives today with [name] healthier looking wound bed contractions and the top 50% of this wound and some on the lesser 50%. Wound bed appears healthy. The area between the first and second toe on the right still remains problematic 04/21/17; continued very gradual improvement. Using [facility] 04/28/17; continued very gradual improvement in the left lateral leg venous insufficiency wound. His periwound erythema is very mild. We have been using Hydrofera Blue. Wound is making progress especially in the superior 50% 05/05/17;  he continues to have very gradual improvement in the left lateral venous insufficiency wound. Both in terms with an length rings are improving. I debrided this every 2 [name] with #5 curet and we have been using Hydrofera Blue and again making good progress With regards to the wounds between his right first and second toe which I thought might of been tinea pedis he is not making as much progress very dry scaly skin over the area. Also the area at the base of the left first and second toe in [name] similar condition 05/12/17; continued gradual improvement in the refractory left lateral venous insufficiency wound on the left. Dimension smaller. Surface still requiring debridement using Hydrofera Blue 05/19/17; continued gradual improvement in the refractory left lateral venous ulceration. Careful inspection of the wound bed underlying rumination suggested some degree of epithelialization over the surface no debridement indicated. Continue Hydrofera Blue difficult areas between his toes first and third on the left than first and second on the right. I'[name] going to change to silver alginate from silver collagen. Continue ketoconazole as I suspect underlying tinea pedis 05/26/17; left lateral leg venous insufficiency wound. We've been using Hydrofera Blue. I believe that there is expanding epithelialization over the surface of the wound albeit not coming from the wound circumference. This is [name] bit of an odd situation in which the epithelialization seems to be coming from the surface of the wound rather than in the exact circumference. There is still small open areas mostly along the lateral margin of the wound. ooHe has unchanged areas between the left first and second and the right first second toes which I been treating for tenia pedis 06/02/17; left lateral leg venous insufficiency wound. We have been using Hydrofera Blue. Somewhat smaller from the wound circumference. The surface of the wound remains [name] bit on it  almost epithelialized sedation in appearance. I use an open curette today debridement in the surface of all of this especially the edges ooSmall open wounds remaining on the dorsal right first and second toe interspace and the plantar left first second toe and her face on the left 06/09/17; wound on the left lateral leg continues to be smaller but very gradual and very dry surface using Hydrofera Blue 06/16/17 requires weekly debridements now on the left lateral leg although this continues to contract. I changed to silver collagen last week because of dryness of the wound bed. Using Iodoflex to the areas on his first and second toes/web space bilaterally 06/24/17; patient with history of paraplegia also chronic venous insufficiency with lymphedema. Has [name] very difficult wound on the left lateral leg. This has been gradually reducing in terms of with but comes in with [name] very dry adherent surface. High switch to silver collagen [name] week or so ago with hydrogel to keep the area moist. This is been refractory to multiple dressing attempts. He also has areas in  his first and second toes bilaterally in the anterior and posterior web space. I had been using Iodoflex here after [name] prolonged course of silver alginate with ketoconazole was ineffective [question tinea pedis] 07/14/17; patient arrives today with [name] very difficult adherent material over his left lateral lower leg wound. He also has surrounding erythema and poorly controlled edema. He was switched his Santyl last visit which the nurses are applying once during his doctor visit and once on [name] nurse visit. He was also reduced to 2 layer compression I'[name] not exactly sure of the issue here. 07/21/17; better surface today after 1 week of Iodoflex. Significant cellulitis that we treated last week also better. [Doxycycline] 07/28/17 better surface today with now 2 [name] of Iodoflex. Significant cellulitis treated with doxycycline. He has now completed the doxycycline  and he is back to his usual degree of chronic venous inflammation/stasis dermatitis. He reminds me he has had ablations surgery here 08/04/17; continued improvement with Iodoflex to the left lateral leg wound in terms of the surface of the wound although the dimensions are better. 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 Problematic areas on the plantar aspect of the first second toe web space on the left and the dorsal aspect of the first second toe web space on the right. At one point I felt these were probably related to chronic fungal infections in treated him aggressively for this although we have not made any improvement here. 08/11/17; left lateral leg. Surface continues to improve with the Iodoflex although we are not seeing much improvement in overall wound dimensions. Areas on his plantar left foot and right foot show no improvement. In fact the right foot looks somewhat worse 08/18/17; left lateral leg. We changed to [facility] Blue last week after [name] prolonged course of Iodoflex which helps get the surface better. It appears that the wound with is improved. Continue with difficult areas on the left dorsal first second and plantar first second on the right 09/01/17; patient arrives in clinic today having had [name] temperature of 103 yesterday. He was seen in the ER and [facility]. The patient was concerned he could have cellulitis again in the right leg however they diagnosed him with [name] UTI and he is now on Keflex. He has [name] history of cellulitis which is been recurrent and difficult but this is been in the left leg, in the past 5 use doxycycline. He does in and out catheterizations at home which are risk factors for UTI 09/08/17; patient will be completing his Keflex this weekend. The erythema on the left leg is considerably better. He has [name] new wound today on the medial part of the right leg small superficial almost looks like [name]  skin tear. He has worsening of the area on the right dorsal first and second toe. His major area on the left lateral leg is better. Using Hydrofera Blue on all areas 09/15/17; gradual reduction in width on the long wound in the left lateral leg. No debridement required. He also has wounds on the plantar aspect of his left first second toe web space and on the dorsal aspect of the right first second toe web space. 09/22/17; there continues to be very gradual improvements in the dimensions of the left lateral leg wound. He hasn't round erythematous spot with might be pressure on his wheelchair. There is no evidence obviously of infection no purulence no warmth ooHe has [name] dry scaled area on  the plantar aspect of the left first second toe ooImproved area on the dorsal right first second toe. 09/29/17; left lateral leg wound continues to improve in dimensions mostly with an is still [name] fairly long but increasingly narrow wound. ooHe has [name] dry scaled area on the plantar aspect of his left first second toe web space ooIncreasingly concerning area on the dorsal right first second toe. In fact I am concerned today about possible cellulitis around this wound. The areas extending up his second toe and although there is deformities here almost appears to abut on the nailbed. 10/06/17; left lateral leg wound continues to make very gradual progress. Tissue culture I did from the right first second toe dorsal foot last time grew MRSA and enterococcus which was vancomycin sensitive. This was not sensitive to clindamycin or doxycycline. He is allergic to Zyvox and sulfa we have therefore arrange for him to have dalvance infusion tomorrow. He is had this in the past and tolerated it well 10/20/17; left lateral leg wound continues to make decent progress. This is certainly reduced in terms of with there is advancing epithelialization.ooThe cellulitis in the right foot looks better although he still has [name] deep wound  in the dorsal aspect of the first second toe web space. Plantar left first toe web space on the left I think is making some progress 10/27/17; left lateral leg wound continues to make decent progress. Advancing epithelialization.using Hydrofera Blue ooThe right first second toe web space wound is better-looking using silver alginate ooImprovement in the left plantar first second toe web space. Again using silver alginate 11/03/17 left lateral leg wound continues to make decent progress albeit slowly. Using Hydrofera Blue ooThe right per second toe web space continues to be [name] very problematic looking punched out wound. I obtained [name] piece of tissue for deep culture I did extensively treated this for fungus. It is difficult to imagine that this is [name] pressure area as the patient states other than going outside he doesn't really wear shoes at home ooThe left plantar first second toe web space looked fairly senescent. Necrotic edges. This required debridement oochange to Hydrofera Blue to all wound areas 11/10/17; left lateral leg wound continues to contract. Using Hydrofera Blue ooOn the right dorsal first second toe web space dorsally. Culture I did of this area last week grew MRSA there is not an easy oral option in this patient was multiple antibiotic allergies or intolerances. This was only [name] rare culture isolate I'[name] therefore going to use Bactroban under silver alginate ooOn the left plantar first second toe web space. Debridement is required here. This is also unchanged 11/17/17; left lateral leg wound continues to contract using Hydrofera Blue this is no longer the major issue. ooThe major concern here is the right first second toe web space. He now has an open area going from dorsally to the plantar aspect. There is now wound on the inner lateral part of the first toe. Not [name] very viable surface on this. There is erythema spreading medially into the forefoot. ooNo major change in the left  first second toe plantar wound 11/24/17; left lateral leg wound continues to contract using Hydrofera Blue. Nice improvement today ooThe right first second toe web space all of this looks [name] lot less angry than last week. I have given him clindamycin and topical Bactroban for MRSA and terbinafine for the possibility of underlining tinea pedis that I could not control with ketoconazole. Looks somewhat better ooThe area on the  plantar left first second toe web space is weeping with dried debris around the wound 12/01/17; left lateral leg wound continues to contract he Hydrofera Blue. It is becoming thinner in terms of with nevertheless it is making good improvement. ooThe right first second toe web space looks less angry but still [name] large necrotic-looking wounds starting on the plantar aspect of the right foot extending between the toes and now extensively on the base of the right second toe. I gave him clindamycin and topical Bactroban for MRSA anterior benefiting for the possibility of underlying tinea pedis. Not looking better today ooThe area on the left first/second toe looks better. Debrided of necrotic debris 12/05/17* the patient was worked in urgently today because over the weekend he found blood on his incontinence bad when he woke up. He was found to have an ulcer by his wife who does most of his wound care. He came in today for [place] to look at this. He has not had [name] history of wounds in his buttocks in spite of his paraplegia. 12/08/17; seen in follow-up today at his usual appointment. He was seen earlier this week and found to have [name] new wound on his buttock. We also follow him for wounds on the left lateral leg, left first second toe web space and right first second toe web space 12/15/17; we have been using Hydrofera Blue to the left lateral leg which has improved. The right first second toe web space has also improved. Left first second toe web space plantar aspect looks stable. The left  buttock has worsened using Santyl. Apparently the buttock has drainage 12/22/17; we have been using Hydrofera Blue to the left lateral leg which continues to improve now 2 small wounds separated by normal skin. He tells [place] he had [name] fever up to 100 yesterday he is prone to UTIs but has not noted anything different. He does in and out catheterizations. The area between the first and second toes today does not look good necrotic surface covered with what looks to be purulent drainage and erythema extending into the third toe. I had gotten this to something that I thought look better last time however it is not look good today. He also has [name] necrotic surface over the buttock wound which is expanded. I thought there might be infection under here so I removed [name] lot of the surface with [name] #5 curet though nothing look like it really needed culturing. He is been using Santyl to this area 12/27/17; his original wound on the left lateral leg continues to improve using Hydrofera Blue. I gave him samples of Baxdella although he was unable to take them out of fear for an allergic reaction ["lump in his throat"].the culture I did of the purulent drainage from his second toe last week showed both enterococcus and [name] set Enterobacter I was also concerned about the erythema on the bottom of his foot although paradoxically although this looks somewhat better today. 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 We've been using silver alginate here. They came up today that he sleeps in [name] recliner, I'[name] not sure why but I asked him to stop this 01/03/18; his original wound we've been using Hydrofera Blue is now separated into 2 areas. ooUlcer on his left buttock is better he is off the recliner and sleeping in bed ooFinally both wound areas between his first and second toes also  looks some better 01/10/18; his original wound on the left  lateral leg is now separated into 2 wounds we've been using Hydrofera Blue ooUlcer on his left buttock has some drainage. There is [name] small probing site going into muscle layer superiorly.using silver alginate -He arrives today with [name] deep tissue injury on the left heel ooThe wound on the dorsal aspect of his first second toe on the left looks [name] lot betterusing silver alginate ketoconazole ooThe area on the first second toe web space on the right also looks [name] lot bette 01/17/18; his original wound on the left lateral leg continues to progress using Hydrofera Blue ooUlcer on his left buttock also is smaller surface healthier except for [name] small probing site going into the muscle layer superiorly. 2.4 cm of tunneling in this area ooDTI on his left heel we have only been offloading. Looks better than last week no threatened open no evidence of infection oothe wound on the dorsal aspect of the first second toe on the left continues to look like it's regressing we have only been using silver alginate and terbinafine orally ooThe area in the first second toe web space on the right also looks to be [name] lot better using silver alginate and terbinafine I think this was prompted by tinea pedis 01/31/18; the patient was hospitalized in [place] last week apparently for [name] complicated UTI. He was discharged on cefepime he does in and out catheterizations. In the hospital he was discovered [name] I don't mild elevation of AST and ALT and the terbinafine was stopped.predictably the pressure ulcer on s his buttock looks betterusing silver alginate. The area on the left lateral leg also is better using Hydrofera Blue. The area between the first and second toes on the left better. First and second toes on the right still substantial but better. Finally the DTI on the left heel has held together and looks like it's resolving 02/07/18-he is here in follow-up evaluation for multiple ulcerations. He has new injury to the  lateral aspect of the last issue [name] pressure ulcer, he states this is from adhesive removal trauma. He states he has tried multiple adhesive products with no success. All other ulcers appear stable. The left heel DTI is resolving. We will continue with same treatment plan and follow-up next week. 02/14/18; follow-up for multiple areas. ooHe has [name] new area last week on the lateral aspect of his pressure ulcer more over the posterior trochanter. The original pressure ulcer looks quite stable has healthy granulation. 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 Almost fully epithelialized on the original superior area using Hydrofera Blue ooDTI on the left heel has peeled off this week to reveal [name] small superficial wound under denuded skin and subcutaneous tissue ooBoth areas between the first and second toes look better including nothing open on the left 02/21/18; ooThe patient's wounds on his left ischial tuberosity and posterior left greater trochanter actually looked better. He has [name] large area of irritation around the area which I think is contact dermatitis. I am doubtful that this is fungal ooHis original wound on the left lateral calf continues to improve we have been using Hydrofera Blue ooThere is no open area in the left first second toe web space although there is [name] lot of thick callus ooThe DTI on the left heel required debridement today of necrotic surface eschar and subcutaneous tissue using silver alginate   ooFinally the area on the right first second toe webspace continues to contract using silver alginate and ketoconazole 02/28/18 ooLeft ischial tuberosity wounds look better using silver alginate. ooOriginal wound on the left calf only has one small open area left using Hydrofera Blue ooDTI on the left heel required debridement mostly removing skin from around this wound  surface. Using silver alginate ooThe areas on the right first/second toe web space using silver alginate and ketoconazole 03/08/18 on evaluation today patient appears to be doing decently well as best I can tell in regard to his wounds. This is the first time that I have seen him as he generally is followed by Dr. Dellia Nims. With that being said none of his wounds appear to be infected he does have an area where there is some skin covering what appears to be [name] new wound on the left dorsal surface of his great toe. This is right at the nail bed. With that being said I do believe that debrided away some of the excess skin can be of benefit in this regard. Otherwise he has been tolerating the dressing changes without complication. 03/14/18; patient arrives today with the multiplicity of wounds that we are following. He has not been systemically unwell ooOriginal wound on the left lateral calf now only has 2 small open areas we've been using Hydrofera Blue which should continue ooThe deep tissue injury on the left heel requires debridement today. We've been using silver alginate ooThe left first second toe and the right first second toe are both are reminiscence what I think was tinea pedis. Apparently some of the callus Surface between the toes was removed last week when it started draining. ooPurulent drainage coming from the wound on the ischial tuberosity on the left. 03/21/18-He is here in follow-up evaluation for multiple wounds. 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 He tolerated debridement. The only change to last week's recommendations is to discontinue antifungal cream between toes. He will follow-up next week 03/28/18; following up for multiple wounds;Concern this week is streaking redness and swelling in the right foot. He is going to need antibiotics for this. 03/31/18; follow-up for right foot cellulitis. Streaking  redness and swelling in the right foot on 03/28/18. 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 I put him on clindamycin 300 mg every 6 and brought him in for [name] quick check. He has an open wound between his first and second toes on the right foot as [name] potential source. 04/04/18; ooRight foot cellulitis is resolving he is completing clindamycin. This is truly good news ooLeft lateral calf wound which is initial wound only has one small open area inferiorly this is close to healing out. He has compression stockings. We will use Hydrofera Blue right down to the epithelialization of this ooNonviable surface on the left heel which was initially pressure with [name] DTI. We've been using Hydrofera Blue. I'[name] going to switch this back to silver alginate ooLeft first second toe/tinea pedis this looks better using silver alginate ooRight first second toe tinea pedis using silver alginate ooLarge pressure ulcers on theLeft ischial tuberosity. Small wound here Looks better. I am uncertain about the surface over the large wound. Using silver alginate 04/11/18; ooCellulitis in the right foot is resolved ooLeft lateral calf wound which was his original wounds still has 2 tiny open areas remaining this is just about closed ooNonviable surface on the left heel is better but still  requires debridement ooLeft first second toe/tinea pedis still open using silver alginate ooRight first second toe wound tinea pedis I asked him to go back to using ketoconazole and silver alginate ooLarge pressure ulcers on the left ischial tuberosity this shear injury here is resolved. Wound is smaller. No evidence of infection using silver alginate 04/18/18; ooPatient arrives with an intense area of cellulitis in the right mid lower calf extending into the right heel area. Bright red and warm. Smaller area on the left anterior leg. He has [name] significant history of MRSA. He will definitely need  antibioticsoodoxycycline ooHe now has 2 open areas on the left ischial tuberosity the original large wound and now [name] satellite area which I think was above his initial satellite areas. Not [name] wonderful surface on this satellite area surrounding erythema which looks like pressure related. ooHis left lateral calf wound again his original wound is just about closed ooLeft heel pressure injury still requiring debridement ooLeft first second toe looks [name] lot better using silver alginate ooRight first second toe also using silver alginate and ketoconazole cream also looks better 04/20/18; the patient was worked in early today out of concerns with his cellulitis on the right leg. I had started him on doxycycline. This was 2 days ago. His wife was concerned about the swelling in the area. Also concerned about the left buttock. He has not been systemically unwell no fever chills. No nausea vomiting or diarrhea 04/25/18; the patient's left buttock wound is continued to deteriorate he is using Hydrofera Blue. He is still completing clindamycin for the cellulitis on the right leg although all of this looks better. 05/02/18 ooLeft buttock wound still with [name] lot of drainage and [name] very tightly adherent fibrinous necrotic surface. He has [name] deeper area superiorly ooThe left lateral calf wound is still closed ooDTI wound on the left heel necrotic surface especially the circumference using Iodoflex ooAreas between his left first second toe and right first second toe both look better. Dorsally and the right first second toe he had [name] necrotic surface although at smaller. In using silver alginate and ketoconazole. I did [name] culture last week which was [name] deep tissue culture of the reminiscence of the open wound on the right first second toe dorsally. This grew [name] few Acinetobacter and [name] few methicillin-resistant staph aureus. Nevertheless the area actually this week looked better. I didn't feel the need to specifically  address this at least in terms of systemic antibiotics. 05/09/18; wounds are measuring larger more drainage per our intake. We are using Santyl covered with alginate on the large superficial buttock wounds, Iodosorb on the left heel, ketoconazole and silver alginate to the dorsal first and second toes bilaterally. 05/16/18; ooThe area on his left buttock better in some aspects although the area superiorly over the ischial tuberosity required an extensive debridement.using Santyl ooLeft heel appears stable. Using Iodoflex ooThe areas between his first and second toes are not bad however there is spreading erythema up the dorsal aspect of his left foot this looks like cellulitis again. He is insensate the erythema is really very brilliant.o Erysipelas He went to see an allergist days ago because he was itching part of this he had lab work done. This showed [name] white count of 15.1 with 70% neutrophils. Hemoglobin of 11.4 and [name] platelet count of 659,000. Last white count we had in Epic was [name] 2-1/2 years ago which was 25.9 but he was ill at the time. He was able to show me  some lab work that was done by his primary physician the pattern is about the same. 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 But prompted me to go ahead and do x-rays of both feet and the pelvis rule out osteomyelitis. He also had [name] comprehensive metabolic panel this was reasonably normal his albumin was 3.7 liver function tests BUN/creatinine all normal 05/23/18; x-rays of both his feet from last week were negative for underlying pulmonary abnormality. The x-ray of his pelvis however showed mild irregularity in the left ischial which may represent some early osteomyelitis. The wound in the left ischial continues to get deeper clearly now exposed muscle. Each week necrotic surface material over this area. Whereas the rest of the wounds do not look so bad. ooThe left ischial wound we have  been using Santyl and calcium alginate ooT the left heel surface necrotic debris using Iodoflex o ooThe left lateral leg is still healed ooAreas on the left dorsal foot and the right dorsal foot are about the same. There is some inflammation on the left which might represent contact dermatitis, fungal dermatitis I am doubtful cellulitis although this looks better than last week 05/30/18; CT scan done at Hospital did not show any osteomyelitis or abscess. Suggested the possibility of underlying cellulitis although I don't see [name] lot of evidence of this at the bedside ooThe wound itself on the left buttock/upper thigh actually looks somewhat better. No debridement ooLeft heel also looks better no debridement continue Iodoflex ooBoth dorsal first second toe spaces appear better using Lotrisone. Left still required debridement 06/06/18; ooIntake reported some purulent looking drainage from the left gluteal wound. Using Santyl and calcium alginate ooLeft heel looks better although still [name] nonviable surface requiring debridement ooThe left dorsal foot first/second webspace actually expanding and somewhat deeper. I may consider doing [name] shave biopsy of this area ooRight dorsal foot first/second webspace appears stable to improved. Using Lotrisone and silver alginate to both these areas 06/13/18 ooLeft gluteal surface looks better. Now separated in the 2 wounds. No debridement required. Still drainage. We'll continue silver alginate ooLeft heel continues to look better with Iodoflex continue this for at least another week ooOf his dorsal foot wounds the area on the left still has some depth although it looks better than last week. We've been using Lotrisone and silver alginate 06/20/18 ooLeft gluteal continues to look better healthy tissue ooLeft heel continues to look better healthy granulation wound is smaller. He is using Iodoflex and his long as this continues continue the  Iodoflex ooDorsal right foot looks better unfortunately dorsal left foot does not. There is swelling and erythema of his forefoot. He had minor trauma to this several days ago but doesn't think this was enough to have caused any tissue injury. Foot looks like cellulitis, we have had this problem before 06/27/18 on evaluation today patient appears to be doing [name] little worse in regard to his foot ulcer. 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 Both of which would really be his only options for treating this infection. In the past he has been given and effusion of Orbactiv. This is done very well for him in the past again it's one time dosing IV antibiotic therapy. Subsequently I do believe this is something we're gonna need to see about doing at this point  in time. Currently his other wounds seem to be doing somewhat better in my pinion I'[name] pretty happy in that regard. 07/03/18 on evaluation today patient's wounds actually appear to be doing fairly well. He has been tolerating the dressing changes without complication. All in all he seems to be showing signs of improvement. In regard to the antibiotics he has been dealing with infectious disease since I saw him last week as far as getting this scheduled. In the end [facility]'s [facility] this coming Friday. In the meantime he has been continuing to perform the dressing changes in such as previous. There does not appear to be any evidence of infection worsengin at this time. 07/10/18; ooSince I last saw this man 2 [name] ago things have actually improved. IV antibiotics of resulted in less forefoot erythema although there is still some present. He is not systemically unwell ooLeft buttock wounds o2 now have no depth there is increased epithelialization Using  silver alginate ooLeft heel still requires debridement using Iodoflex ooLeft dorsal foot still with [name] sizable wound about the size of [name] border but healthy granulation ooRight dorsal foot still with [name] slitlike area using silver alginate 07/18/18; the patient's cellulitis in the left foot is improved in fact I think it is on its way to resolving. ooLeft buttock wounds o2 both look better although the larger one has hypertension granulation we've been using silver alginate ooLeft heel has some thick circumferential redundant skin over the wound edge which will need to be removed today we've been using Iodoflex ooLeft dorsal foot is still [name] sizable wound required debridement using silver alginate ooThe right dorsal foot is just about closed only [name] small open area remains here 07/25/18; left foot cellulitis is resolved ooLeft buttock wounds o2 both look better. Hyper-granulation on the major area ooLeft heel as some debris over the surface but otherwise looks [name] healthier wound. Using silver collagen ooRight dorsal foot is just about closed 07/31/18; arrives with our intake nurse worried about purulent drainage from the buttock. We had hyper-granulation here last week ooHis buttock wounds o2 continue to look better ooLeft heel some debris over the surface but measuring smaller. ooRight dorsal foot unfortunately has openings between the toes ooLeft foot superficial wound looks less aggravated. 08/07/18 ooButtock wounds continue to look better although some of her granulation and the larger medial wound. silver alginate ooLeft heel continues to look [name] lot better.silver collagen ooLeft foot superficial wound looks less stable. Requires debridement. He has [name] new wound superficial area on the foot on the lateral dorsal foot. ooRight foot looks better using silver alginate without Lotrisone 08/14/2018; patient was in the ER last week diagnosed with [name] UTI. He is now on Cefpodoxime and  Macrodantin. ooButtock wounds continued to be smaller. Using silver alginate ooLeft heel continues to look better using silver collagen ooLeft foot superficial wound looks as though it is improving ooRight dorsal foot area is just about healed. 08/21/2018; patient is completed his antibiotics for his UTI. ooHe has 2 open areas on the buttocks. There is still not closed although the surface looks satisfactory. Using silver alginate ooLeft heel continues to improve using silver collagen ooThe bilateral dorsal foot areas which are at the base of his first and second toes/possible tinea pedis are actually stable on the left but worse on the right. The area on the left required debridement of necrotic surface. After debridement I obtained [name] specimen for  PCR culture. ooThe right dorsal foot which is been just about healed last week is now reopened 08/28/2018; culture done on the left dorsal foot showed coag negative staph both staph epidermidis and Lugdunensis. I think this is worthwhile initiating systemic treatment. I will use doxycycline given his long list of allergies. The area on the left heel slightly improved but still requiring debridement. ooThe large wound on the buttock is just about closed whereas the smaller one is larger. Using silver alginate in this area 09/04/2018; patient is completing his doxycycline for the left foot although this continues to be [name] very difficult wound area with very adherent necrotic debris. We are using silver alginate to all his wounds right foot left foot and the small wounds on his buttock, silver collagen on the left heel. 09/11/2018; once again this patient has intense erythema and swelling of the left forefoot. Lesser degrees of erythema in the right foot. He has [name] long list of allergies and intolerances. I will reinstitute doxycycline. 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 Using silver  alginate ooLeft heel also looks better using silver collagen ooUnfortunately both the areas on his feet look worse. The area on the left first second webspace is now gone through to the plantar part of his foot. The area on the left foot anteriorly is irritated with erythema and swelling in the forefoot. 09/25/2018 ooHis wound on the left plantar heel looks better. Using silver collagen ooThe area on the left buttock 2 small remnant areas. One is closed one is still open. Using silver alginate ooThe areas between both his first and second toes look worse. This in spite of long-standing antifungal therapy with ketoconazole and silver alginate which should have antifungal activity ooHe has small areas around his original wound on the left calf one is on the bottom of the original scar tissue and one superiorly both of these are small and superficial but again given wound history in this site this is worrisome 10/02/2018 ooLeft plantar heel continues to gradually contract using silver collagen ooLeft buttock wound is unchanged using silver alginate ooThe areas on his dorsal feet between his first and second toes bilaterally look about the same. 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 Small open area here. We have been using silver alginate 10/09/2018; ooLeft plantar heel continues to look healthy and contract. No debridement is required ooLeft buttock slightly smaller with [name] tape injury wound just below which was new this week ooDorsal feet somewhat improved I have been using clindamycin ooLeft lateral looks lower extremity the actual open area looks worse although [name] lot of this is epithelialized. I am going to change to silver collagen today He has [name] lot more swelling in the right leg although this is  not pitting not red and not particularly warm there is [name] lot of spasm in the right leg usually indicative of people with paralysis of some underlying discomfort. We have reviewed his vascular status from 2017 he had [name] left greater saphenous vein ablation. I wonder about referring him back to vascular surgery if the area on the left leg continues to deteriorate. 10/16/2018 in today for follow-up and management of multiple lower extremity ulcers. His left Buttock wound is much lower smaller and almost closed completely. The wound  to the left ankle has began to reopen with Epithelialization and some adherent slough. He has multiple new areas to the left foot and leg. The left dorsal foot without much improvement. Wound present between left great webspace and 2nd toe. Erythema and edema present right leg. Right LE ultrasound obtained on 10/10/18 was negative for DVT . 10/23/2018; ooLeft buttock is closed over. Still dry macerated skin but there is no open wound. I suspect this is chronic pressure/moisture ooLeft lateral calf is quite [name] bit worse than when I saw this last. There is clearly drainage here he has macerated skin into the left plantar heel. We will change the primary dressing to alginate ooLeft dorsal foot has some improvement in overall wound area. Still using clindamycin and silver alginate ooRight dorsal foot about the same as the left using clindamycin and silver alginate ooThe erythema in the right leg has resolved. He is DVT rule out was negative ooLeft heel pressure area required debridement although the wound is smaller and the surface is health 10/26/2018 ooThe patient came back in for his nurse check today predominantly because of the drainage coming out of the left lateral leg with [name] recent reopening of his original wound on the left lateral calf. He comes in today with [name] large amount of surrounding erythema around the wound extending from the calf into the ankle and even in  the area on the dorsal foot. He is not systemically unwell. He is not febrile. Nevertheless this looks like cellulitis. We have been using silver alginate to the area. I changed him to [name] regular visit and I am going to prescribe him doxycycline. The rationale here is [name] long list of medication intolerances and [name] history of MRSA. I did not see anything that I thought would provide [name] valuable culture 10/30/2018 ooFollow-up from his appointment 4 days ago with really an extensive area of cellulitis in the left calf left lateral ankle and left dorsal foot. I put him on doxycycline. He has [name] long list of medication allergies which are true allergy reactions. 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 In any case he is [name] lot better today. The erythema especially in the anterior and lateral left calf is better. He still has left ankle erythema. He also is complaining about increasing edema in the right leg we have only been using Kerlix Coban and he has been doing the wraps at home. Finally he has [name] spotty rash on the medial part of his upper left calf which looks like folliculitis or perhaps wrap occlusion type injury. Small superficial macules not pustules 11/06/18 patient arrives today with again [name] considerable degree of erythema around the wound on the left lateral calf extending into the dorsal ankle and dorsal foot. This is [name] lot worse than when I saw this last week. He is on doxycycline really with not [name] lot of improvement. He has not been systemically unwell Wounds on the; left heel actually looks improved. Original area on the left foot and proximity to the first and second toes looks about the same. He has superficial areas on the dorsal foot, anterior calf and then the reopening of his original wound on the left lateral calf which looks about the same ooThe only area he has on the right is the dorsal webspace first and second  which is smaller. ooHe has [name] large area of dry erythematous skin on the left buttock small open  area here. 11/13/2018; the patient arrives in much better condition. The erythema around the wound on the left lateral calf is [name] lot better. Not sure whether this was the clindamycin or the TCA and ketoconazole or just in the improvement in edema control [stasis dermatitis]. In any case this is [name] lot better. The area on the left heel is very small and just about resolved using silver collagen we have been using silver alginate to the areas on his dorsal feet 11/20/2018; his wounds include the left lateral calf, left heel, dorsal aspects of both feet just proximal to the first second webspace. He is stable to slightly improved. I did not think any changes to his dressings were going to be necessary 11/27/2018 he has [name] reopening on the left buttock which is surrounded by what looks like tinea or perhaps some other form of dermatitis. The area on the left dorsal foot has some erythema around it I have marked this area but I am not sure whether this is cellulitis or not. Left heel is not closed. Left calf the reopening is really slightly longer and probably worse 1/13; in general things look better and smaller except for the left dorsal foot. Area on the left heel is just about closed, left buttock looks better only [name] small wound remains in the skin looks better [using Lotrisone] 1/20; the area on the left heel only has [name] few remaining open areas here. Left lateral calf about the same in terms of size, left dorsal foot slightly larger right lateral foot still not closed. The area on the left buttock has no open wound and the surrounding skin looks [name] lot better 1/27; the area on the left heel is closed. Left lateral calf better but still requiring extensive debridements. The area on his left buttock is closed. He still has the open areas on the left dorsal foot which is slightly smaller in the right foot which is  slightly expanded. We have been using Iodoflex on these areas as well 2/3; left heel is closed. Left lateral calf still requiring debridement using Iodoflex there is no open area on his left buttock however he has dry scaly skin over [name] large area of this. Not really responding well to the Lotrisone. Finally the areas on his dorsal feet at the level of the first second webspace are slightly smaller on the right and about the same on the left. Both of these vigorously debrided with Anasept and gauze 2/10; left heel remains closed he has dry erythematous skin over the left buttock but there is no open wound here. Left lateral leg has come in and with. Still requiring debridement we have been using Iodoflex here. Finally the area on the left dorsal foot and right dorsal foot are really about the same extremely dry callused fissured areas. He does not yet have [name] dermatology appointment 2/17; left heel remains closed. He has [name] new open area on the left buttock. The area on the left lateral calf is bigger longer and still covered in necrotic debris. No major change in his foot areas bilaterally. I am awaiting for [name] dermatologist to look on this. We have been using ketoconazole I do not know that this is been doing any good at all. 2/24; left heel remains closed. The left buttock wound that was new reopening last week looks better. The left lateral calf appears better also although still requires debridement. The major area on his foot is the left first second also requiring  debridement. We have been putting Prisma on all wounds. I do not believe that the ketoconazole has done too much good for his feet. He will use Lotrisone I am going to give him [name] 2-week course of terbinafine. We still do not have [name] dermatology appointment 3/2 left heel remains closed however there is skin over bone in this area I pointed this out to him today. The left buttock wound is epithelialized but still does not look completely  stable. The area on the left leg required debridement were using silver collagen here. With regards to his feet we changed to Lotrisone last week and silver alginate. 3/9; left heel remains closed. Left buttock remains closed. The area on the right foot is essentially closed. The left foot remains unchanged. Slightly smaller on the left lateral calf. Using silver collagen to both of these areas 3/16-Left heel remains closed. Area on right foot is closed. Left lateral calf above the lateral malleolus open wound requiring debridement with easy bleeding. Left dorsal wound proximal to first toe also debrided. Left ischial area open new. Patient has been using Prisma with wrapping every 3 days. Dermatology appointment is apparently tomorrow.Patient has completed his terbinafine 2-week course with some apparent improvement according to him, there is still flaking and dry skin in his foot on the left 3/23; area on the right foot is reopened. The area on the left anterior foot is about the same still [name] very necrotic adherent surface. He still has the area on the left leg and reopening is on the left buttock. He apparently saw dermatology although I do not have [name] note. According to the patient who is usually fairly well informed they did not have any good ideas. Put him on oral terbinafine which she is been on before. 3/30; using silver collagen to all wounds. Apparently his dermatologist put him on doxycycline and rifampin presumably some culture grew staph. I do not have this result. He remains on terbinafine although I have used terbinafine on him before 4/6; patient has had [name] fairly substantial reopening on the right foot between the first and second toes. He is finished his terbinafine and I believe is on doxycycline and rifampin still as prescribed by dermatology. We have been using silver collagen to all his wounds although the patient reports that he thinks silver alginate does better on the wounds on  his buttock. 4/13; the area on his left lateral calf about the same size but it did not require debridement. ooLeft dorsal foot just proximal to the webspace between the first and second toes is about the same. Still nonviable surface. I note some superficial bronze discoloration of the dorsal part of his foot ooRight dorsal foot just proximal to the first and second toes also looks about the same. I still think there may be the same discoloration I noted above on the left ooLeft buttock wound looks about the same 4/20; left lateral calf appears to be gradually contracting using silver collagen. 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 The left dorsal foot wound is debrided of tightly adherent necrotic debris and really cleans up quite nicely. The right area is worse with expansion. I did not debride this it is now over the base of the second toe ooThe area on his left buttock is smaller no debridement is required using silver collagen 5/4; left calf continues to make good progress. ooHe arrives with erythema around the wounds on his dorsal foot which even  extends to the plantar aspect. Very concerning for coexistent infection. He is finished the erythromycin I gave him for possible erythrasma this does not seem to have helped. ooThe area on the left foot is about the same base of the dorsal toes ooIs area on the buttock looks improved on the left 5/11; left calf and left buttock continued to make good progress. Left foot is about the same to slightly improved. ooMajor problem is on the right foot. He has not had an x-ray. Deep tissue culture I did last week showed both Enterobacter and E. coli. I did not change the doxycycline I put him on empirically although neither 1 of these were plated to doxycycline. He arrives today with the erythema looking worse on both the dorsal and plantar foot. Macerated skin on the bottom of the  foot. he has not been systemically unwell 5/18-Patient returns at 1 week, left calf wound appears to be making some progress, left buttock wound appears slightly worse than last time, left foot wound looks slightly better, right foot redness is marginally better. X-ray of both feet show no air or evidence of osteomyelitis. Patient is finished his Omnicef and terbinafine. He continues to have macerated skin on the bottom of the left foot as well as right 5/26; left calf wound is better, left buttock wound appears to have multiple small superficial open areas with surrounding macerated skin. X-rays that I did last time showed no evidence of osteomyelitis in either foot. He is finished cefdinir and doxycycline. I do not think that he was on terbinafine. He continues to have [name] large superficial open area on the right foot anterior dorsal and slightly between the first and second toes. I did send him to dermatology 2 months ago or so wondering about whether they would do [name] fungal scraping. I do not believe they did but did do [name] culture. We have been using silver alginate to the toe areas, he has been using antifungals at home topically either ketoconazole or Lotrisone. We are using silver collagen on the left foot, silver alginate on the right, silver collagen on the left lateral leg and silver alginate on the left buttock 6/1; left buttock area is healed. We have the left dorsal foot, left lateral leg and right dorsal foot. We are using silver alginate to the areas on both feet and silver collagen to the area on his left lateral calf 6/8; the left buttock apparently reopened late last week. He is not really sure how this happened. He is tolerating the terbinafine. Using silver alginate to all wounds 6/15; left buttock wound is larger than last week but still superficial. ooCame in the clinic today with [name] report of purulence from the left lateral leg I did not identify any infection ooBoth areas on his  dorsal feet appear to be better. He is tolerating the terbinafine. Using silver alginate to all wounds 6/22; left buttock is about the same this week, left calf quite [name] bit better. His left foot is about the same however he comes in with erythema and warmth in the right forefoot once again. Culture that I gave him in the beginning of May showed Enterobacter and E. coli. I gave him doxycycline and things seem to improve although neither 1 of these organisms was specifically plated. 6/29; left buttock is larger and dry this week. Left lateral calf looks to me to be improved. Left dorsal foot also somewhat improved right foot completely unchanged. The erythema on the right foot  is still present. He is completing the Ceftin dinner that I gave him empirically [see discussion above.) 7/6 - All wounds look to be stable and perhaps improved, the left buttock wound is slightly smaller, per patient bleeds easily, completed ceftin, the right foot redness is less, he is on terbinafine 7/13; left buttock wound about the same perhaps slightly narrower. Area on the left lateral leg continues to narrow. Left dorsal foot slightly smaller right foot about the same. We are using silver alginate on the right foot and Hydrofera Blue to the areas on the left. Unna boot on the left 2 layer compression on the right 7/20; left buttock wound absolutely the same. Area on lateral leg continues to get better. Left dorsal foot require debridement as did the right no major change in the 7/27; left buttock wound the same size necrotic debris over the surface. The area on the lateral leg is closed once again. His left foot looks better right foot about the same although there is some involvement now of the posterior first second toe area. He is still on terbinafine which I have given him for [name] month, not certain [name] centimeter major change 06/25/19-All wounds appear to be slightly improved according to report, left buttock wound looks  clean, both foot wounds have minimal to no debris the right dorsal foot has minimal slough. We are using Hydrofera Blue to the left and silver alginate to the right foot and ischial wound. 8/10-Wounds all appear to be around the same, the right forefoot distal part has some redness which was not there before, however the wound looks clean and small. Ischial wound looks about the same with no changes 8/17; his wound on the left lateral calf which was his original chronic venous insufficiency wound remains closed. Since I last saw him the areas on the left dorsal foot right dorsal foot generally appear better but require debridement. The area on his left initial tuberosity appears somewhat larger to me perhaps hyper granulated and bleeds very easily. We have been using Hydrofera Blue to the left dorsal foot and silver alginate to everything else 8/24; left lateral calf remains closed. The areas on his dorsal feet on the webspace of the first and second toes bilaterally both look better. The area on the left buttock which is the pressure ulcer stage II slightly smaller. I change the dressing to Hydrofera Blue to all areas 8/31; left lateral calf remains closed. The area on his dorsal feet bilaterally look better. Using Hydrofera Blue. Still requiring debridement on the left foot. No change in the left buttock pressure ulcers however 9/14; left lateral calf remains closed. Dorsal feet look quite [name] bit better than 2 [name] ago. Flaking dry skin also [name] lot better with the ammonium lactate I gave him 2 [name] ago. The area on the left buttock is improved. He states that his Roho cushion developed [name] leak and he is getting [name] new one, in the interim he is offloading this vigorously 9/21; left calf remains closed. Left heel which was [name] possible DTI looks better this week. He had macerated tissue around the left dorsal foot right foot looks satisfactory and improved left buttock wound. I changed his dressings to  his feet to silver alginate bilaterally. Continuing Hydrofera Blue on the left buttock. 9/28 left calf remains closed. Left heel did not develop anything [possible DTI] dry flaking skin on the left dorsal foot. Right foot looks satisfactory. Improved left buttock wound. We are using silver alginate  on his feet Hydrofera Blue on the buttock. I have asked him to go back to the Lotrisone on his feet including the wounds and surrounding areas 10/5; left calf remains closed. The areas on the left and right feet about the same. [name] lot of this is epithelialized however debris over the remaining open areas. He is using Lotrisone and silver alginate. The area on the left buttock using Hydrofera Blue 10/26. Patient has been out for 3 [name] secondary to Covid concerns. He tested negative but I think his wife tested positive. He comes in today with the left foot substantially worse, right foot about the same. Even more concerning he states that the area on his left buttock closed over but then reopened and is considerably deeper in one aspect than it was before [stage III wound] 11/2; left foot really about the same as last week. Quarter sized wound on the dorsal foot just proximal to the first second toes. Surrounding erythema with areas of denuded epithelium. This is not really much different looking. Did not look like cellulitis this time however. ooRight foot area about the same.. We have been using silver alginate alginate on his toes ooLeft buttock still substantial irritated skin around the wound which I think looks somewhat better. We have been using Hydrofera Blue here. 11/9; left foot larger than last week and [name] very necrotic surface. Right foot I think is about the same perhaps slightly smaller. Debris around the circumference also addressed. Unfortunately on the left buttock there is been [name] decline. Satellite lesions below the major wound distally and now [name] an additional one posteriorly we have been  using Hydrofera Blue but I think this is [name] pressure issue 11/16; left foot ulcer dorsally again [name] very adherent necrotic surface. Right foot is about the same. Not much change in the pressure ulcer on his left buttock. 11/30; left foot ulcer dorsally basically the same as when I saw him 2 [name] ago. Very adherent fibrinous debris on the wound surface. Patient reports [name] lot of drainage as well. 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 We have been using Iodoflex, patient changed back to alginate because of the drainage. Area on his right dorsal foot really looks benign with [name] healthier surface certainly [name] lot better than on the left. Left buttock wounds all improved using Hydrofera Blue 12/7; left dorsal foot again no improvement. Tightly adherent debris. PCR culture I did last week only showed likely skin contaminant. I have gone ahead and done [name] punch biopsy of this which is about the last thing in terms of investigations I can think to do. He has known venous insufficiency and venous hypertension and this could be the issue here. The area on the right foot is about the same left buttock slightly worse according to our intake nurse secondary to [facility] Blue sticking to the wound 12/14; biopsy of the left foot that I did last time showed changes that could be related to wound healing/chronic stasis dermatitis phenomenon no neoplasm. We have been using silver alginate to both feet. I change the one on the left today to Sorbact and silver alginate to his other 2 wounds 12/28; the patient arrives with the following problems; ooMajor issue is the dorsal left foot which continues to be [name] larger deeper wound area. Still with [name] completely nonviable surface ooParadoxically the area mirror image on the right on the right dorsal foot appears to be getting better. ooHe had  some loss of dry denuded skin from the lower part of his original wound on the left  lateral calf. Some of this area looked [name] little vulnerable and for this reason we put him in wrap that on this side this week ooThe area on his left buttock is larger. He still has the erythematous circular area which I think is [name] combination of pressure, sweat. This does not look like cellulitis or fungal dermatitis 11/26/2019; -Dorsal left foot large open wound with depth. Still debris over the surface. Using Sorbact ooThe area on the dorsal right foot paradoxically has closed over [facility] has [name] reopening on the left ankle laterally at the base of his original wound that extended up into the calf. This appears clean. ooThe left buttock wound is smaller but with very adherent necrotic debris over the surface. We have been using silver alginate here as well The patient had arterial studies done in 2017. He had biphasic waveforms at the dorsalis pedis and posterior tibial bilaterally. ABI in the left was 1.17. Digit waveforms were dampened. He has slight spasticity in the great toes I do not think [name] TBI would be possible 1/11; the patient comes in today with [name] sizable reopening between the first and second toes on the right. This is not exactly in the same location where we have been treating wounds previously. According to our intake nurse this was actually fairly deep but 0.6 cm. The area on the left dorsal foot looks about the same the surface is somewhat cleaner using Sorbact, his MRI is in 2 days. We have not managed yet to get arterial studies. The new reopening on the left lateral calf looks somewhat better using alginate. The left buttock wound is about the same using alginate 1/18; the patient had his ARTERIAL studies which were quite normal. ABI in the right at 1.13 with triphasic/biphasic waveforms on the left ABI 1.06 again with triphasic/biphasic waveforms. It would not have been possible to have done [name] toe brachial index because of spasticity. We have been using Sorbac to the left foot  alginate to the rest of his wounds on the right foot left lateral calf and left buttock 1/25; arrives in clinic with erythema and swelling of the left forefoot worse over the first MTP area. This extends laterally dorsally and but also posteriorly. Still has an area on the left lateral part of the lower part of his calf wound it is eschared and clearly not closed. ooArea on the left buttock still with surrounding irritation and erythema. ooRight foot surface wound dorsally. The area between the right and first and second toes appears better. 2/1; ooThe left foot wound is about the same. Erythema slightly better I gave him [name] week of doxycycline empirically ooRight foot wound is more extensive extending between the toes to the plantar surface ooLeft lateral calf really no open surface on the inferior part of his original wound however the entire area still looks vulnerable ooAbsolutely no improvement in the left buttock wound required debridement. 2/8; the left foot is about the same. Erythema is slightly improved I gave him clindamycin last week. ooRight foot looks better he is using Lotrimin and silver alginate ooHe has [name] breakdown in the left lateral calf. Denuded epithelium which I have removed ooLeft buttock about the same were using Hydrofera Blue 2/15; left foot is about the same there is less surrounding erythema. Surface still has tightly adherent debris which I have debriding however not making any progress ooRight foot  has [name] substantial wound on the medial right second toe between the first and second webspace. ooStill an open area on the left lateral calf distal area. ooButtock wound is about the same 2/22; left foot is about the same less surrounding erythema. Surface has adherent debris. Polymen Ag Right foot area significant wound between the first and second toes. We have been using silver alginate here Left lateral leg polymen Ag at the base of his original venous  insufficiency wound ooLeft buttock some improvement here 3/1; ooRight foot is deteriorating in the first second toe webspace. Larger and more substantial. We have been using silver alginate. ooLeft dorsal foot about the same markedly adherent surface debris using PolyMem Ag ooLeft lateral calf surface debris using PolyMem AG ooLeft buttock is improved again using PolyMem Ag. ooHe is completing his terbinafine. The erythema in the foot seems better. He has been on this for 2 [name] 3/8; no improvement in any wound area in fact he has [name] small open area on the dorsal midfoot which is new this week. He has not gotten his foot x-rays yet 3/15; his x-rays were both negative for osteomyelitis of both feet. No major change in any of his wounds on the extremities however his buttock wounds are better. We have been using polymen on the buttocks, left lower leg. Iodoflex on the left foot and silver alginate on the right 3/22; arrives in clinic today with the 2 major issues are the improvement in the left dorsal foot wound which for once actually looks healthy with [name] nice healthy wound surface without debridement. Using Iodoflex here. Unfortunately on the left lateral calf which is in the distal part of his original wound he came to the clinic here for there was purulent drainage noted some increased breakdown scattered around the original area and [name] small area proximally. We we are using polymen here will change to silver alginate today. His buttock wound on the left is better and I think the area on the right first second toe webspace is also improved 3/29; left dorsal foot looks better. Using Iodoflex. Left ankle culture from deterioration last time grew E. coli, Enterobacter and Enterococcus. I will give him [name] course of cefdinir although that will not cover Enterococcus. The area on the right foot in the webspace of the first and second toe lateral first toe looks better. The area on his buttock is  about healed Vascular appointment is on April 21. This is to look at his venous system vis--vis continued breakdown of the wounds on the left including the left lateral leg and left dorsal foot he. He has had previous ablations on this side 4/5; the area between the right first and second toes lateral aspect of the first toe looks better. Dorsal aspect of the left first toe on the left foot also improved. Unfortunately the left lateral lower leg is larger and there is [name] second satellite wound superiorly. The usual superficial abrasions on the left buttock overall better but certainly not closed 4/12; the area between the right first and second toes is improved. Dorsal aspect of the left foot also slightly smaller with [name] vibrant healthy looking surface. No real change in the left lateral leg and the left buttock wound is healed He has an unaffordable co-pay for Apligraf. Appointment with vein and vascular with regards to the left leg venous part of the circulation is on 4/21 4/19; we continue to see improvement in all wound areas. Although this is minor. He  has his vascular appointment on 4/21. The area on the left buttock has not reopened although right in the center of this area the skin looks somewhat threatened 4/26; the left buttock is unfortunately reopened. In general his left dorsal foot has [name] healthy surface and looks somewhat smaller although it was not measured as such. The area between his first and second toe webspace on the right as [name] small wound against the first toe. The patient saw vascular surgery. The real question I was asking was about the small saphenous vein on the left. He has previously ablated left greater saphenous vein. Nothing further was commented on on the left. Right greater saphenous vein without reflux at the saphenofemoral junction or proximal thigh there was no indication for ablation of the right greater saphenous vein duplex was negative for DVT bilaterally. They  did not think there was anything from [name] vascular surgery point of view that could be offered. They ABIs within normal limits 5/3; only small open area on the left buttock. The area on the left lateral leg which was his original venous reflux is now 2 wounds both which look clean. We are using Iodoflex on the left dorsal foot which looks healthy and smaller. He is down to [name] very tiny area between the right first and second toes, using silver alginate 5/10; all of his wounds appear better. We have much better edema control in 4 layer compression on the left. This may be the factor that is allowing the left foot and left lateral calf to heal. He has external compression garments at home 04/14/20-All of his wounds are progressing well, the left forefoot is practically closed, left ischium appears to be about the same, right toe webspace is also smaller. The left lateral leg is about the same, continue using Hydrofera Blue to this, silver alginate to the ischium, Iodoflex to the toe space on the right 6/7; most of his wounds outside of the left buttock are doing well. The area on the left lateral calf and left dorsal foot are smaller. The area on the right foot in between the first and second toe webspace is barely visible although he still says there is some drainage here is the only reason I did not heal this out. ooUnfortunately the area on the left buttock almost looks like he has [name] skin tear from tape. He has open wound and then [name] large flap of skin that we are trying to get adherence over an area just next to the remaining wound 6/21; 2 week follow-up. I believe is been here for nurse visits. Miraculously the area between his first and second toes on the left dorsal foot is closed over. Still open on the right first second web space. The left lateral calf has 2 open areas. Distally this is more superficial. The proximal area had [name] little more depth and required debridement of adherent necrotic  material. His buttock wound is actually larger we have been using silver alginate here 6/28; the patient's area on the left foot remains closed. Still open wet area between the first and second toes on the right and also extending into the plantar aspect. We have been using silver alginate in this location. He has 2 areas on the left lower leg part of his original long wounds which I think are better. We have been using Hydrofera Blue here. Hydrofera Blue to the left buttock which is stable 7/12; left foot remains closed. Left ankle is closed. May be [name]  small area between his right first and second toes the only truly open area is on the left buttock. We have been using Hydrofera Blue here 7/19; patient arrives with marked deterioration especially in the left foot and ankle. We did not put him in [name] compression wrap on the left last week in fact he wore his juxta lite stockings on either side although he does not have an underlying stocking. He has [name] reopening on the left dorsal foot, left lateral ankle and [name] new area on the right dorsal ankle. More worrisome is the degree of erythema on the left foot extending on the lateral foot into the lateral lower leg on the left 7/26; the patient had erythema and drainage from the lateral left ankle last week. Culture of this grew MRSA resistant to doxycycline and clindamycin which are the 2 antibiotics we usually use with this patient who has multiple antibiotic allergies including linezolid, trimethoprim sulfamethoxazole. I had give him an empiric doxycycline and he comes in the area certainly looks somewhat better although it is blotchy in his lower leg. He has not been systemically unwell. He has had areas on the left dorsal foot which is [name] reopening, chronic wounds on the left lateral ankle. Both of these I think are secondary to chronic venous insufficiency. The area between his first and second toes is closed as far as I can tell. He had [name] new wrap injury on  the right dorsal ankle last week. Finally he has an area on the left buttock. We have been using silver alginate to everything except the left buttock we are using Hydrofera Blue 06/30/20-Patient returns at 1 week, has been given [name] sample dose pack of NUZYRA which is [name] tetracycline derivative [omadacycline], patient has completed those, we have been using silver alginate to almost all the wounds except the left ischium where we are using Hydrofera Blue all of them look better 8/16; since I last saw the patient he has been doing well. The area on the left buttock, left lateral ankle and left foot are all closed today. He has completed the [place] I gave him last time and tolerated this well. He still has open areas on the right dorsal ankle and in the right first second toe area which we are using silver alginate. 8/23; we put him in his bilateral external compression stockings last week as he did not have anything open on either leg except for concerning area between the right first and second toe. He comes in today with an area on the left dorsal foot slightly more proximal than the original wound, the left lateral foot but this is actually [name] continuation of the area he had on the left lateral ankle from last time. As well he is opened up on the left buttock again. 8/30; comes in today with things looking [name] lot better. The area on the left lower ankle has closed down as has the left foot but with eschar in both areas. The area on the dorsal right ankle is also epithelialized. Very little remaining of the left buttock wound. We have been using silver alginate on all wound areas 9/13; the area in the first second toe webspace on the right has fully epithelialized. He still has some vulnerable epithelium on the right and the ankle and the dorsal foot. He notes weeping. He is using his juxta lite stocking. On the left again the left dorsal foot is closed left lateral ankle is closed. We went to the  juxta  lite stocking here as well. ooStill vulnerable in the left buttock although only 2 small open areas remain here 9/27; 2-week follow-up. We did not look at his left leg but the patient says everything is closed. He is [name] bit disturbed by the amount of edema in his left foot he is using juxta lite stockings but asking about over the toes stockings which would be 30/40, will talk to him next time. According to him there is no open wound on either the left foot or the left ankle/calf He has an open area on the dorsal right calf which I initially point [name] wrap injury. He has superficial remaining wound on the left ischial tuberosity been using silver alginate although he says this sticks to the wound 10/5; we gave him 2-week follow-up but he called yesterday expressing some concerns about his right foot right ankle and the left buttock. He came in early. There is still no open areas on the left leg and that still in his juxta lite stocking 10/11; he only has 1 small area on the left buttock that remains measuring millimeters 1 mm. Still has the same irritated skin in this area. We recommended zinc oxide when this eventually closes and pressure relief is meticulously is he can do this. He still has an area on the dorsal part of his right first through third toes which is [name] bit irritated and still open and on the dorsal ankle near the crease of the ankle. We have been using silver alginate and using his own stocking. He has nothing open on the left leg or foot 10/25; 2-week follow-up. Not nearly as good on the left buttock as I was hoping. For open areas with 5 looking threatened small. He has the erythematous irritated chronic skin in this area. oo1 area on the right dorsal ankle. He reports this area bleeds easily ooRight dorsal foot just proximal to the base of his toes ooWe have been using silver alginate. 11/8; 2-week follow-up. Left buttock is about the same although I do not think the wounds are  in the same location we have been using silver alginate. I have asked him to use zinc oxide on the skin around the wounds. ooHe still has [name] small area on the right dorsal ankle he reports this bleeds easily ooRight dorsal foot just proximal to the base of the toes does not have anything open although the skin is very dry and scaly ooHe has [name] new opening on the nailbed of the left great toe. Nothing on the left ankle 11/29; 3-week follow-up. Left buttock has 2 open areas. And washing of these wounds today started bleeding easily. Suggesting very friable tissue. We have been using silver alginate. Right dorsal ankle which I thought was initially [name] wrap injury we have been using silver alginate. Nothing open between the toes that I can see. He states the area on the left dorsal toe nailbed healed after the last visit in 2 or 3 days 12/13; 3-week follow-up. His left buttock now has 3 open areas but the original 2 areas are smaller using polymen here. Surrounding skin looks better. The right dorsal ankle is closed. He has [name] small opening on the right dorsal foot at the level of the third toe. In general the skin looks better here. He is wearing his juxta lite stocking on the left leg says there is nothing open 11/24/2020; 3 [name] follow-up. His left buttock still has the 3 open areas. We have  been using polymen but due to lack of response he changed to [facility] area. Surrounding skin is dry erythematous and irritated looking. There is no evidence of infection either bacterial or fungal however there is loss of surface epithelium ooHe still has very dry skin in his foot causing irritation and erythema on the dorsal part of his toes. This is not responded to prolonged courses of antifungal simply looks dry and irritated 1/24; left buttock area still looks about the same he was unable to find the triad ointment that we had suggested. The area on the right lower leg just above the dorsal ankle has  reopened and the areas on the right foot between the first second and second third toes and scaling on the bottom of the foot has been about the same for quite some time now. been using silver alginate to all wound areas 2/7; left buttock wound looked quite good although not much smaller in terms of surface area surrounding skin looks better. Only [name] few dry flaking areas on the right foot in between the first and second toes the skin generally looks better here [ammonium lactate]. Finally the area on the right dorsal ankle is closed 2/21; ooThere is no open area on the right foot even between the right first and second toe. Skin around this area dorsally and plantar aspects look better. ooHe has [name] reopening of the area on the right ankle just above the crease of the ankle dorsally. I continue to think that this is probably friction from spasms may be even this time with his stocking under the compression stockings. ooWounds on his left buttock look about the same there [name] couple of areas that have reopened. He has [name] total square area of loss of epithelialization. This does not look like infection it looks like [name] contact dermatitis but I just cannot determine to what 3/14; there is nothing on the right foot between the first and second toes this was carefully inspected under illumination. Some chronic irritation on the dorsal part of his foot from toes 1-3 at the base. Nothing really open here substantially. Still has an area on the right foot/ankle that is actually larger and hyper granulated. His buttock area on the left is just about closed however he has chronic inflammation with loss of the surface epithelial layer 3/28; 2-week follow-up. In clinic today with [name] new wound on the left anterior mid tibia. Says this happened about 2 [name] ago. He is not really sure how wonders about the spasticity of his legs at night whether that could have caused this other than that he does not have [name] good idea.  He has been using topical antibiotics and silver alginate. The area on his right dorsal ankle seems somewhat better. ooFinally everything on his left buttock is closed. 4/11; 2-week follow-up. All of his wounds are better except for the area over the ischium and left buttock which have opened up widely again. At least part of this is covered in necrotic fibrinous material another part had rolled nonviable skin. The area on the right ankle, left anterior mid tibia are both [name] lot better. He had no open wounds on either foot including the areas between the first and second toes 4/25; patient presents for 2-week follow-up. He states that the wounds are overall stable. He has no complaints today and states he is using Hydrofera Blue to open wounds. 5/9; have not seen this man in over [name] month. For my memory he has  open areas on the left mid tibia and right ankle. T oday he has new open area on the right dorsal foot which we have not had [name] problem with recently. He has the sustained area on the left buttock He is also changed his insurance at the beginning of the year [employer]. We will need prior authorizations for debridement 5/23; patient presents for 2-week follow-up. He has prior authorizations for debridement. He denies any issues in the past 2 [name] with his wound care. He has been using Hydrofera Blue to all the wounds. He does report [name] circular rash to the upper left leg that is new. He denies acute signs of infection. 6/6; 2-week follow-up. The patient has open wounds on the left buttock which are worse than the last time I saw this about [name] month ago. He also has [name] new area to me on the left anterior mid tibia with some surrounding erythema. The area on the dorsal ankle on the right is closed but I think this will be [name] friction injury every time this area is exposed to either our wraps or his compression stockings caused by unrelenting spasms in this leg. 6/20; 2-week follow-up. oo The  patient has open wounds on the left buttock which is about the same. Using [facility] here. - The left mid tibia has [name] static amount of surrounding erythema. Also [name] raised area in the center. We have been using Hydrofera Blue here. ooooFinally he has broken down in his dorsal right foot extending between the first and second toes and going to the base of the first and second toe webspace. I have previously assumed that this was severe venous hypertension 7/5; 2-week follow-up oo The left buttock wound actually looks better. We are using Hydrofera Blue. He has extensive skin irritation around this area and I have not really been able to get that any better. I have tried Lotrisone i.e. antifungals and steroids. More most recently we have just been using Coloplast really looks about the same. oo The left mid tibia which was new last week culture to have very resistant staph aureus. Not only methicillin-resistant but doxycycline resistant. The patient has [name] plethora of antibiotic allergies including sulfa, linezolid. I used topical bacitracin on this but he has not started this yet. oo In addition he has an expanding area of erythema with [name] wound on the dorsal right foot. I did [name] deep tissue culture of this area today 7/12; oo Left buttock area actually looks better surrounding skin also looks less irritated. oo Left mid tibia looks about the same. He is using bacitracin this is not worse oo Right dorsal foot looks about the same as well. oo The left first toe also looks about the same 7/19; left buttock wound continues to improve in terms of open areas oo Left mid tibia is still concerning amount of swelling he is using bacitracin oo Dorsal left first toe somewhat smaller oo Right dorsal foot somewhat smaller 7/25; left buttock wound actually continues to improve oo Left mid tibia area has less swelling. I gave him all my samples of new Nuzyra. This seems to have helped although the  wound is still open it. His abrasion closed by here oo Left dorsal great toe really no better. Still [name] very nonviable surface oo Right dorsal foot perhaps some better. oo We have been using bacitracin and silver nitrate to the areas on his lower legs and Hydrofera Blue to the area on the buttock. 8/16   oo Disappointed that his left buttock wound is actually more substantial. Apparently during the last nurse visit these were both very small. He has continued irritation to [name] large area of skin on his buttock. I have never been able to totally explain this although I think it some combination of the way he sits, pressure, moisture. He is not incontinent enough to contribute to this. oo Left dorsal great toe still fibrinous debris on the surface that I have debrided today oo Large area across the dorsal right toes. oo The area on the left anterior mid tibia has less swelling. He completed the [place]. This does not look infected although the tissue is still fried Objective Constitutional Sitting or standing Blood Pressure is within target range for patient.. Pulse regular and within target range for patient.Marland Kitchen Respirations regular, non-labored and within target range.. Temperature is normal and within the target range for the patient.Marland Kitchen Appears in no distress. Vitals Time Taken: 7:48 AM, Height: 70 in, Weight: 216 lbs, BMI: 31, Temperature: 98.3 F, Pulse: 123 bpm, Respiratory Rate: 20 breaths/min, Blood Pressure: 128/79 mmHg. General Notes: Wound exam; right dorsal foot. No surrounding erythema although the surface of this is not very good. Requires debridement with [name] #5 curette dipping into subcutaneous tissue to remove the very fibrinous nonviable surface. Hemostasis with direct pressure oo Left dorsal great toe essentially in the same condition also requiring debridement oo The left anterior mid tibia does not look infected. Slightly friable. I did not debride this today. oo His buttock  wounds on the left has [name] small area and just underneath it [name] fairly large long wound. I did not debride this today. The issue really is his skin around the wounds and [name] large area around this. Chronic irritation and inflammation. This actually looks some better than what I have seen it Integumentary (Hair, Skin) Wound #41R status is Open. Original cause of wound was Gradually Appeared. The date acquired was: 03/16/2020. The wound has been in treatment 68 [name]. The wound is located on the Left Ischium. The wound measures 0.5cm length x 0.4cm width x 0.1cm depth; 0.157cm^2 area and 0.016cm^3 volume. There is Fat Layer (Subcutaneous Tissue) exposed. There is no tunneling or undermining noted. There is [name] medium amount of sanguinous drainage noted. The wound margin is distinct with the outline attached to the wound base. There is large (67-100%) red, pink, friable granulation within the wound bed. There is no necrotic tissue within the wound bed. Wound #51 status is Open. Original cause of wound was Trauma. The date acquired was: 01/26/2021. The wound has been in treatment 20 [name]. The wound is located on the Left,Anterior Lower Leg. The wound measures 1.5cm length x 0.7cm width x 0.1cm depth; 0.825cm^2 area and 0.082cm^3 volume. There is Fat Layer (Subcutaneous Tissue) exposed. There is no tunneling or undermining noted. There is [name] medium amount of serosanguineous drainage noted. The wound margin is distinct with the outline attached to the wound base. There is large (67-100%) red, friable, hyper - granulation within the wound bed. There is no necrotic tissue within the wound bed. Wound #52 status is Open. Original cause of wound was Gradually Appeared. The date acquired was: 03/27/2021. The wound has been in treatment 14 [name]. The wound is located on the Right,Dorsal Foot. The wound measures 1.5cm length x 4cm width x 0.1cm depth; 4.712cm^2 area and 0.471cm^3 volume. There is Fat Layer (Subcutaneous  Tissue) exposed. There is no tunneling or undermining noted. There is [name] medium amount  of serosanguineous drainage noted. The wound margin is distinct with the outline attached to the wound base. There is small (1-33%) pink granulation within the wound bed. There is [name] large (67-100%) amount of necrotic tissue within the wound bed including Adherent Slough. Wound #54 status is Open. Original cause of wound was Pressure Injury. The date acquired was: 05/11/2021. The wound has been in treatment 8 [name]. The wound is located on the Left,Distal Ischium. The wound measures 3.2cm length x 0.7cm width x 0.1cm depth; 1.759cm^2 area and 0.176cm^3 volume. There is Fat Layer (Subcutaneous Tissue) exposed. There is no tunneling or undermining noted. There is [name] medium amount of sanguinous drainage noted. The wound margin is distinct with the outline attached to the wound base. There is large (67-100%) red, friable granulation within the wound bed. There is no necrotic tissue within the wound bed. Wound #55 status is Open. Original cause of wound was Blister. The date acquired was: 05/26/2021. The wound has been in treatment 6 [name]. The wound is located on the Left T Great. The wound measures 0.8cm length x 1.2cm width x 0.2cm depth; 0.754cm^2 area and 0.151cm^3 volume. There is Fat Layer oe (Subcutaneous Tissue) exposed. There is no tunneling noted, however, there is undermining starting at 6:00 and ending at 12:00 with [name] maximum distance of 0.2cm. There is [name] medium amount of serosanguineous drainage noted. The wound margin is distinct with the outline attached to the wound base. There is small (1-33%) pink granulation within the wound bed. There is [name] large (67-100%) amount of necrotic tissue within the wound bed including Adherent Slough. 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 There was [name] Excisional Skin/Subcutaneous Tissue Debridement with [name] total area of 6 sq cm performed by [name] Dillon., MD. With the following instrument(s): Curette to remove Viable and Non-Viable tissue/material. Material removed includes Subcutaneous Tissue, Slough, Skin: Dermis, Skin: Epidermis, and Fibrin/Exudate. [name] time out was conducted at 08:15, prior to the start of the procedure. [name] Minimum amount of bleeding was controlled with Pressure. The procedure was tolerated well with [name] pain level of 0 throughout and [name] pain level of 0 following the procedure. Post Debridement Measurements: 1.5cm length x 4cm width x 0.1cm depth; 0.471cm^3 volume. Character of Wound/Ulcer Post Debridement is improved. 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 There was [name] Excisional Skin/Subcutaneous Tissue oe Debridement with [name] total area of 0.96 sq cm performed by [name] Dillon., MD. With the following instrument(s): Curette to remove Viable and Non-Viable tissue/material. Material removed includes Subcutaneous Tissue, Slough, Skin: Dermis, Skin: Epidermis, and Fibrin/Exudate. [name] time out was conducted at 08:15, prior to the start of the procedure. [name] Minimum amount of bleeding was controlled with Pressure. The procedure was tolerated well with [name] pain level of 0 throughout and [name] pain level of 0 following the procedure. Post Debridement  Measurements: 0.8cm length x  1.2cm width x 0.2cm depth; 0.151cm^3 volume. Character of Wound/Ulcer Post Debridement is improved. Post procedure Diagnosis Wound #55: Same as Pre-Procedure Plan Follow-up Appointments: Return Appointment in 1 week. - with Dr. Dellia Nims Bathing/ Shower/ Hygiene: May shower and wash wound with soap and water. - on days that dressing is changed Edema Control - Lymphedema / SCD / Other: Elevate legs to the level of the heart or above for 30 minutes daily and/or when sitting, [name] frequency of: - throughout the day Compression stocking or Garment 30-40 mm/Hg pressure to: - Juxtalite to both legs daily Off-Loading: Roho cushion for wheelchair Turn and reposition every 2 hours WOUND #41R: - Ischium Wound Laterality: Left Cleanser: Soap and Water Every Other Day/30 Days Discharge Instructions: May shower and wash wound with dial antibacterial soap and water prior to dressing change. Peri-Wound Care: Triad Hydrophilic Wound Dressing Tube, 6 (oz) Every Other Day/30 Days Discharge Instructions: Apply to periwound with each dressing change Peri-Wound Care: Lotrisone Every Other Day/30 Days Discharge Instructions: thin layer under triad cream. Prim Dressing: Hydrofera Blue Ready Foam, 2.5 x2.5 in Every Other Day/30 Days ary Discharge Instructions: Apply to wound bed as instructed Secondary Dressing: ComfortFoam Border, 4x4 in (silicone border) Every Other Day/30 Days Discharge Instructions: Apply over primary dressing as directed. WOUND #51: - Lower Leg Wound Laterality: Left, Anterior Cleanser: Soap and Water Every Other Day/30 Days Discharge Instructions: May shower and wash wound with dial antibacterial soap and water prior to dressing change. Prim Dressing: Hydrofera Blue Ready Foam, 2.5 x2.5 in Every Other Day/30 Days ary Discharge Instructions: Apply to wound bed as instructed Secondary Dressing: ComfortFoam Border, 4x4 in (silicone border) Every Other Day/30  Days Discharge Instructions: Apply over primary dressing as directed. WOUND #52: - Foot Wound Laterality: Dorsal, Right Cleanser: Soap and Water Every Other Day/30 Days Discharge Instructions: May shower and wash wound with dial antibacterial soap and water prior to dressing change. Prim Dressing: Hydrofera Blue Ready Foam, 2.5 x2.5 in Every Other Day/30 Days ary Discharge Instructions: Apply to wound bed as instructed Secondary Dressing: ComfortFoam Border, 4x4 in (silicone border) Every Other Day/30 Days Discharge Instructions: Apply over primary dressing as directed. WOUND #54: - Ischium Wound Laterality: Left, Distal Cleanser: Soap and Water Every Other Day/30 Days Discharge Instructions: May shower and wash wound with dial antibacterial soap and water prior to dressing change. Peri-Wound Care: Triad Hydrophilic Wound Dressing Tube, 6 (oz) Every Other Day/30 Days Discharge Instructions: Apply to periwound with each dressing change Peri-Wound Care: Lotrisone Every Other Day/30 Days Discharge Instructions: apply under the triad cream. Prim Dressing: Hydrofera Blue Ready Foam, 2.5 x2.5 in Every Other Day/30 Days ary Discharge Instructions: Apply to wound bed as instructed Secondary Dressing: ComfortFoam Border, 4x4 in (silicone border) Every Other Day/30 Days Discharge Instructions: Apply over primary dressing as directed. WOUND #55: - T Great Wound Laterality: Left oe Cleanser: Soap and Water Every Other Day/30 Days Discharge Instructions: May shower and wash wound with dial antibacterial soap and water prior to dressing change. Prim Dressing: Hydrofera Blue Classic Foam, 2x2 in Every Other Day/30 Days ary Discharge Instructions: Moisten with saline prior to applying to wound bed Secondary Dressing: ComfortFoam Border, 4x4 in (silicone border) Every Other Day/30 Days Discharge Instructions: Apply over primary dressing as directed. Com pression Stockings: Circaid Juxta Lite  Compression Wrap Compression Amount: 30-40 mmHg (left) Discharge Instructions: Apply Circaid Juxta Lite Compression Wrap daily as instructed. Apply first thing in the morning, remove at night before bed. 1. I change  the primary dressing on the lower extremities both right and left to [facility]. Hopeful to add some debridement in the case the left anterior mid tibia to deal with slight hypergranulation and friability. This may need silver nitrate I will consider that next week when I see him 2. I have added Lotrisone under the Coloplast to the skin on his buttock. Still with Hydrofera Blue as the primary dressing. 3. I am continuing with Lotrisone on the right foot I do not think he needs ketoconazole Electronic Signature(s) Signed: 07/07/2021 4:53:39 PM By: [name] Ham MD Entered By: [name] on 07/07/2021 08:58:00 -------------------------------------------------------------------------------- SuperBill Details Patient Name: Date of Service: [name], [name] 07/07/2021 Medical Record Number: [id number] Patient Account Number: [id number] Date of Birth/Sex: Treating RN: March 18, 1986 (35 y.o. [name] Primary Care Provider: [place], [place] Other Clinician: Referring Provider: Treating Provider/Extender: [name] in Treatment: 287 Diagnosis Coding ICD-10 Codes Code Description I87.332 Chronic venous hypertension (idiopathic) with ulcer and inflammation of left lower extremity L97.511 Non-pressure chronic ulcer of other part of right foot limited to breakdown of skin 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, complete L89.323 Pressure ulcer of left buttock, stage 3 L97.821 Non-pressure chronic ulcer of other part of left lower leg limited to breakdown of skin L97.521 Non-pressure chronic ulcer of other part of left foot limited to breakdown of skin A49.02 Methicillin resistant Staphylococcus aureus infection, unspecified site Facility Procedures CPT4 Code:  [id number] Description: [id number] - DEB SUBQ TISSUE 20 SQ CM/< ICD-10 Diagnosis Description L97.511 Non-pressure chronic ulcer of other part of right foot limited to breakdown of L97.521 Non-pressure chronic ulcer of other part of left foot limited to breakdown of Modifier: skin skin Quantity: 1 Physician Procedures : CPT4 Code Description Modifier [id number] - WC PHYS SUBQ TISS 20 SQ CM ICD-10 Diagnosis Description L97.511 Non-pressure chronic ulcer of other part of right foot limited to breakdown of skin L97.521 Non-pressure chronic ulcer of other part of left  foot limited to breakdown of skin Quantity: 1 Electronic Signature(s) Signed: 07/07/2021 4:53:39 PM By: [name] Ham MD Entered By: [name] on 07/07/2021 08:58:12

## 2021-07-07 NOTE — Progress Notes (Signed)
[name], [name] ([id number]) Visit Report for 07/07/2021 Arrival Information Details Patient Name: Date of Service: [name], [name] 07/07/2021 7:30 [name] Medical Record Number: [id number] Patient Account Number: [id number] Date of Birth/Sex: Treating RN: 03/09/1986 (35 y.o. [name] Primary Care Shivonne Schwartzman: [name], [name] Other Clinician: Referring Scharlene Catalina: Treating Shaquan Missey/Extender: [name]: 287 Visit Information History Since Last Visit Added or deleted any medications: No Patient Arrived: Wheel Chair Any new allergies or adverse reactions: No Arrival Time: 07:47 Had [name] fall or experienced change in No Accompanied By: self activities of daily living that may affect Transfer Assistance: None risk of falls: Patient Identification Verified: Yes Signs or symptoms of abuse/neglect since last visito No Secondary Verification Process Completed: Yes Hospitalized since last visit: No Patient Requires Transmission-Based Precautions: No Implantable device outside of the clinic excluding No Patient Has Alerts: Yes cellular tissue based products placed in the center Patient Alerts: R ABI = 1.0 since last visit: L ABI = 1.1 Has Dressing in Place as Prescribed: Yes Has Compression in Place as Prescribed: Yes Pain Present Now: No Electronic Signature(s) Signed: 07/07/2021 5:19:53 PM By: Deon Pilling Entered By: Deon Pilling on 07/07/2021 07:58:46 -------------------------------------------------------------------------------- Encounter Discharge Information Details Patient Name: Date of Service: Date, [name] 07/07/2021 7:30 [name] Medical Record Number: [id number] Patient Account Number: [id number] Date of Birth/Sex: Treating RN: 04-13-1986 (35 y.o. [name] Primary Care Zaid Tomes: [place], [place] Other Clinician: Referring Jerome Viglione: Treating Arie Gable/Extender: [name]: 287 Encounter Discharge  Information Items Post Procedure Vitals Discharge Condition: Stable Temperature (F): 98.2 Ambulatory Status: Wheelchair Pulse (bpm): 123 Discharge Destination: Home Respiratory Rate (breaths/min): 20 Transportation: Private Auto Blood Pressure (mmHg): 128/79 Accompanied By: self Schedule Follow-up Appointment: Yes Clinical Summary of Care: Electronic Signature(s) Signed: 07/07/2021 5:19:53 PM By: Deon Pilling Entered By: Deon Pilling on 07/07/2021 08:38:53 -------------------------------------------------------------------------------- Lower Extremity Assessment Details Patient Name: Date of Service: [name], [name] 07/07/2021 7:30 [name] Medical Record Number: [id number] Patient Account Number: [id number] Date of Birth/Sex: Treating RN: 04-23-1986 (35 y.o. [name] Primary Care Tyaisha Cullom: [name], [name] Other Clinician: Referring Lakiah Dhingra: Treating Simara Rhyner/Extender: [name]: 287 Edema Assessment Assessed: [Left: Yes] [Right: Yes] Edema: [Left: Yes] [Right: Yes] Calf Left: Right: Point of Measurement: 33 cm From Medial Instep 28 cm 33 cm Ankle Left: Right: Point of Measurement: 10 cm From Medial Instep 25 cm 24 cm Electronic Signature(s) Signed: 07/07/2021 5:19:53 PM By: Deon Pilling Entered By: Deon Pilling on 07/07/2021 07:59:25 -------------------------------------------------------------------------------- Multi Wound Chart Details Patient Name: Date of Service: [name], [name] 07/07/2021 7:30 [name] Medical Record Number: [id number] Patient Account Number: [id number] Date of Birth/Sex: Treating RN: [birth year] (35 y.o. [name] Primary Care Aleks Nawrot: [name], [name] Other Clinician: Referring Mazikeen Hehn: Treating Adylynn Hertenstein/Extender: Dutch Gray, [name]: 287 Vital Signs Height(in): 70 Pulse(bpm): 123 Weight(lbs): 216 Blood Pressure(mmHg): 128/79 Body Mass Index(BMI):  31 Temperature(F): 98.3 Respiratory Rate(breaths/min): 20 Photos: [41R:No Photos Left Ischium] [51:No Photos Left, Anterior Lower Leg] [52:No Photos Right, Dorsal Foot] Wound Location: [41R:Gradually Appeared] [51:Trauma] [52:Gradually Appeared] Wounding Event: [41R:Pressure Ulcer] [51:Trauma, Other] [52:Trauma, Other] Primary Etiology: [41R:Sleep Apnea, Hypertension, Paraplegia Sleep Apnea, Hypertension, Paraplegia Sleep Apnea, Hypertension, Paraplegia] Comorbid History: [41R:03/16/2020] [51:01/26/2021] [52:03/27/2021] Date Acquired: [41R:68] [51:20] [52:14] Weeks of Treatment: [41R:Open] [51:Open] [52:Open] Wound Status: [41R:Yes] [51:No] [52:No] Wound Recurrence: [41R:Yes] [51:No] [52:No] Clustered Wound: [41R:1] [51:[name]] [52:[name]] Clustered Quantity: [41R:0.5x0.4x0.1] [51:1.5x0.7x0.1] [52:1.5x4x0.1] Measurements L x W  x D (cm) [41R:0.157] [51:0.825] [52:4.712] [name] (cm) : rea [41R:0.016] [51:0.082] [52:0.471] Volume (cm) : [41R:91.30%] [51:-150.00%] [52:-150.00%] % Reduction in [name] rea: [41R:91.20%] [51:-148.50%] [52:-150.50%] % Reduction in Volume: [41R:No] [51:No] [52:No] Undermining: [41R:Category/Stage II] [51:Full Thickness Without Exposed] [52:Full Thickness Without Exposed] Classification: [41R:Medium] [51:Support Structures Medium] [52:Support Structures Medium] Exudate Amount: [41R:Sanguinous] [51:Serosanguineous] [52:Serosanguineous] Exudate Type: [41R:red] [51:red, brown] [52:red, brown] Exudate Color: [41R:Distinct, outline attached] [51:Distinct, outline attached] [52:Distinct, outline attached] Wound Margin: [41R:Large (67-100%)] [51:Large (67-100%)] [52:Small (1-33%)] Granulation Amount: [41R:Red, Pink, Friable] [51:Red, Hyper-granulation, Friable] [52:Pink] Granulation Quality: [41R:None Present (0%)] [51:None Present (0%)] [52:Large (67-100%)] Necrotic Amount: [41R:Fat Layer (Subcutaneous Tissue): Yes Fat Layer (Subcutaneous Tissue): Yes Fat Layer (Subcutaneous Tissue):  Yes] Exposed Structures: [41R:Fascia: No Tendon: No Muscle: No Joint: No Bone: No Medium (34-66%)] [51:Fascia: No Tendon: No Muscle: No Joint: No Bone: No Small (1-33%)] [52:Fascia: No Tendon: No Muscle: No Joint: No Bone: No None] Epithelialization: [41R:[name]] [51:[name]] [52:Debridement - Excisional] Debridement: Pre-procedure Verification/Time Out [name] [51:[name]] [52:08:15] Taken: [41R:[name]] [51:[name]] [52:Subcutaneous, Slough] Tissue Debrided: [41R:[name]] [51:[name]] [52:Skin/Subcutaneous Tissue] Level: [41R:[name]] [51:[name]] [52:6] Debridement [name] (sq cm): [41R:rea [name]] [51:[name]] [52:Curette] Instrument: [41R:[name]] [51:[name]] [52:Minimum] Bleeding: [41R:[name]] [51:[name]] [52:Pressure] Hemostasis [name] chieved: [41R:[name]] [51:[name]] [52:0] Procedural Pain: [41R:[name]] [51:[name]] [52:0] Post Procedural Pain: [41R:[name]] [51:[name]] [52:Procedure was tolerated well] Debridement Treatment Response: [41R:[name]] [51:[name]] [52:1.5x4x0.1] Post Debridement Measurements L x W x D (cm) [41R:[name]] [51:[name]] [52:0.471] Post Debridement Volume: (cm) [41R:[name]] [51:[name]] [52:Debridement] Wound Number: [id number] [name] Photos: No Photos No Photos [name] Left, Distal Ischium Left T Great oe [name] Wound Location: Pressure Injury Blister [name] Wounding Event: Pressure Ulcer Neuropathic Ulcer-Non Diabetic [name] Primary Etiology: Sleep Apnea, Hypertension, Paraplegia Sleep Apnea, Hypertension, Paraplegia [name] Comorbid History: 05/11/2021 05/26/2021 [name] Date Acquired: 8 6 [name] Weeks of Treatment: Open Open [name] Wound Status: No No [name] Wound Recurrence: No No [name] Clustered Wound: [name] [name] [name] Clustered Quantity: 3.2x0.7x0.1 0.8x1.2x0.2 [name] Measurements L x W x D (cm) 1.759 0.754 [name] [name] (cm) : rea 0.176 0.151 [name] Volume (cm) : -24.40% 3.90% [name] % Reduction in [name] rea: -24.80% -91.10% [name] % Reduction in Volume: 6 Starting Position 1 (o'clock): 12 Ending Position 1 (o'clock): 0.2 Maximum Distance 1 (cm): No Yes [name] Undermining: Category/Stage III  Full Thickness Without Exposed [name] Classification: Support Structures Medium Medium [name] Exudate [name] mount: Sanguinous Serosanguineous [name] Exudate Type: red red, brown [name] Exudate Color: Distinct, outline attached Distinct, outline attached [name] Wound Margin: Large (67-100%) Small (1-33%) [name] Granulation [name] mount: Red, Friable Pink [name] Granulation Quality: None Present (0%) Large (67-100%) [name] Necrotic [name] mount: Fat Layer (Subcutaneous Tissue): Yes Fat Layer (Subcutaneous Tissue): Yes [name] Exposed Structures: Fascia: No Fascia: No Tendon: No Tendon: No Muscle: No Muscle: No Joint: No Joint: No Bone: No Bone: No None None [name] Epithelialization: [name] Debridement - Excisional [name] Debridement: Pre-procedure Verification/Time Out [name] 08:15 [name] Taken: [name] Subcutaneous, Slough [name] Tissue Debrided: [name] Skin/Subcutaneous Tissue [name] Level: [name] 0.96 [name] Debridement [name] (sq cm): rea [name] Curette [name] Instrument: [name] Minimum [name] Bleeding: [name] Pressure [name] Hemostasis [name] chieved: [name] 0 [name] Procedural Pain: [name] 0 [name] Post Procedural Pain: [name] Procedure was tolerated well [name] Debridement Treatment Response: [name] 0.8x1.2x0.2 [name] Post Debridement Measurements L x W x D (cm) [name] 0.151 [name] Post Debridement Volume: (cm) [name] Debridement [name] Procedures Performed: Treatment Notes Wound #[id number] (Ischium) Wound Laterality: Left Cleanser Soap and Water Discharge Instruction: May shower and wash wound with dial antibacterial soap and water prior to dressing change. Peri-Wound Care Triad  Hydrophilic Wound Dressing Tube, 6 (oz) Discharge Instruction: Apply to periwound with each dressing change Lotrisone Discharge Instruction: thin layer under triad cream. Topical Primary Dressing Hydrofera Blue Ready Foam, 2.5 x2.5 in Discharge Instruction: Apply to wound bed as instructed Secondary Dressing ComfortFoam Border, 4x4 in (silicone border) Discharge Instruction: Apply over primary dressing  as directed. Secured With Compression Wrap Compression Stockings Add-Ons Wound #51 (Lower Leg) Wound Laterality: Left, Anterior Cleanser Soap and Water Discharge Instruction: May shower and wash wound with dial antibacterial soap and water prior to dressing change. Peri-Wound Care Topical Primary Dressing Hydrofera Blue Ready Foam, 2.5 x2.5 in Discharge Instruction: Apply to wound bed as instructed Secondary Dressing ComfortFoam Border, 4x4 in (silicone border) Discharge Instruction: Apply over primary dressing as directed. Secured With Compression Wrap Compression Stockings Add-Ons Wound #52 (Foot) Wound Laterality: Dorsal, Right Cleanser Soap and Water Discharge Instruction: May shower and wash wound with dial antibacterial soap and water prior to dressing change. Peri-Wound Care Topical Primary Dressing Hydrofera Blue Ready Foam, 2.5 x2.5 in Discharge Instruction: Apply to wound bed as instructed Secondary Dressing ComfortFoam Border, 4x4 in (silicone border) Discharge Instruction: Apply over primary dressing as directed. Secured With Compression Wrap Compression Stockings Add-Ons Wound #54 (Ischium) Wound Laterality: Left, Distal Cleanser Soap and Water Discharge Instruction: May shower and wash wound with dial antibacterial soap and water prior to dressing change. Peri-Wound Care Triad Hydrophilic Wound Dressing Tube, 6 (oz) Discharge Instruction: Apply to periwound with each dressing change Lotrisone Discharge Instruction: apply under the triad cream. Topical Primary Dressing Hydrofera Blue Ready Foam, 2.5 x2.5 in Discharge Instruction: Apply to wound bed as instructed Secondary Dressing ComfortFoam Border, 4x4 in (silicone border) Discharge Instruction: Apply over primary dressing as directed. Secured With Compression Wrap Compression Stockings Add-Ons Wound #55 (Toe Great) Wound Laterality: Left Cleanser Soap and Water Discharge Instruction: May  shower and wash wound with dial antibacterial soap and water prior to dressing change. Peri-Wound Care Topical Primary Dressing Hydrofera Blue Classic Foam, 2x2 in Discharge Instruction: Moisten with saline prior to applying to wound bed Secondary Dressing ComfortFoam Border, 4x4 in (silicone border) Discharge Instruction: Apply over primary dressing as directed. Secured With Compression Wrap Compression Stockings Circaid Juxta Lite Compression Wrap Quantity: 1 Left Leg Compression Amount: 30-40 mmHg Discharge Instruction: Apply Circaid Juxta Lite Compression Wrap daily as instructed. Apply first thing in the morning, remove at night before bed. Add-Ons Electronic Signature(s) Signed: 07/07/2021 4:53:39 PM By: Linton Ham MD Signed: 07/07/2021 5:16:57 PM By: Rhae Hammock RN Entered By: Linton Ham on 07/07/2021 08:51:23 -------------------------------------------------------------------------------- Multi-Disciplinary Care Plan Details Patient Name: Date of Service: [name], [name] 07/07/2021 7:30 [name] Medical Record Number: [id number] Patient Account Number: [id number] Date of Birth/Sex: Treating RN: Oct 10, 1986 (35 y.o. [name] Primary Care Kaleb Linquist: [name], [name] Other Clinician: Referring Makaiyah Schweiger: Treating Dailen Mcclish/Extender: [name]: [place] reviewed with physician Active Inactive Wound/Skin Impairment Nursing Diagnoses: Impaired tissue integrity Knowledge deficit related to ulceration/compromised skin integrity Goals: Patient/caregiver will verbalize understanding of skin care regimen Date Initiated: 01/05/2016 Target Resolution Date: 08/07/2021 Goal Status: Active Ulcer/skin breakdown will have [name] volume reduction of 30% by week 4 Date Initiated: 01/05/2016 Date Inactivated: 12/22/2017 Target Resolution Date: 01/19/2018 Unmet Reason: complex wounds, Goal Status:  Unmet infection Interventions: Assess patient/caregiver ability to obtain necessary supplies Assess ulceration(s) every visit Provide education on ulcer and skin care Notes: 02/02/21: Complex Care, ongoing. Electronic Signature(s) Signed: 07/07/2021 5:19:53 PM By: Deon Pilling Entered By:  Deon Pilling on 07/07/2021 08:04:51 -------------------------------------------------------------------------------- Pain Assessment Details Patient Name: Date of Service: [name], [name] 07/07/2021 7:30 [name] Medical Record Number: [id number] Patient Account Number: [id number] Date of Birth/Sex: Treating RN: 1986-03-07 (35 y.o. [name] Primary Care Chloe Miyoshi: [place], [place] Other Clinician: Referring Oris Staffieri: Treating Singleton Hickox/Extender: [name]: 287 Active Problems Location of Pain Severity and Description of Pain Patient Has Paino No Site Locations Rate the pain. Rate the pain. Current Pain Level: 0 Pain Management and Medication Current Pain Management: Medication: No Cold Application: No Rest: No Massage: No Activity: No T.FerrellN.S.: No Heat Application: No Leg drop or elevation: No Is the Current Pain Management Adequate: Adequate How does your wound impact your activities of daily livingo Sleep: No Bathing: No Appetite: No Relationship With Others: No Bladder Continence: No Emotions: No Bowel Continence: No Work: No Toileting: No Drive: No Dressing: No Hobbies: No Electronic Signature(s) Signed: 07/07/2021 5:19:53 PM By: Deon Pilling Entered By: Deon Pilling on 07/07/2021 07:59:10 -------------------------------------------------------------------------------- Patient/Caregiver Education Details Patient Name: Date of Service: Gerdeman, [name] 8/16/2022andnbsp7:30 [name] Medical Record Number: [id number] Patient Account Number: [id number] Date of Birth/Gender: Treating RN: 07/05/1986 (35 y.o. [name] Primary Care  Physician: Janine Limbo Other Clinician: Referring Physician: Treating Physician/Extender: [name]: 287 Education Assessment Education Provided To: Patient Education Topics Provided Wound/Skin Impairment: Handouts: Skin Care Do's and Dont's Methods: Explain/Verbal Responses: Reinforcements needed Electronic Signature(s) Signed: 07/07/2021 5:19:53 PM By: Deon Pilling Entered By: Deon Pilling on 07/07/2021 08:05:14 -------------------------------------------------------------------------------- Wound Assessment Details Patient Name: Date of Service: [name], [name] 07/07/2021 7:30 [name] Medical Record Number: [id number] Patient Account Number: [id number] Date of Birth/Sex: Treating RN: 1986/09/14 (35 y.o. [name] Primary Care Benji Poynter: [name], [name] Other Clinician: Referring Carrol Hougland: Treating Iver Fehrenbach/Extender: [name]: 287 Wound Status Wound Number: [id number] Primary Etiology: Pressure Ulcer Wound Location: Left Ischium Wound Status: Open Wounding Event: Gradually Appeared Comorbid History: Sleep Apnea, Hypertension, Paraplegia Date Acquired: 03/16/2020 Weeks Of Treatment: 68 Clustered Wound: Yes Photos Photo Uploaded By: Donavan Burnet on 07/07/2021 16:54:31 Wound Measurements Length: (cm) 0.5 Width: (cm) 0.4 Depth: (cm) 0.1 Clustered Quantity: 1 Area: (cm) 0.157 Volume: (cm) 0.016 % Reduction in Area: 91.3% % Reduction in Volume: 91.2% Epithelialization: Medium (34-66%) Tunneling: No Undermining: No Wound Description Classification: Category/Stage II Wound Margin: Distinct, outline attached Exudate Amount: Medium Exudate Type: Sanguinous Exudate Color: red Foul Odor After Cleansing: No Slough/Fibrino No Wound Bed Granulation Amount: Large (67-100%) Exposed Structure Granulation Quality: Red, Pink, Friable Fascia Exposed: No Necrotic Amount: None Present  (0%) Fat Layer (Subcutaneous Tissue) Exposed: Yes Tendon Exposed: No Muscle Exposed: No Joint Exposed: No Bone Exposed: No Treatment Notes Wound #41R (Ischium) Wound Laterality: Left Cleanser Soap and Water Discharge Instruction: May shower and wash wound with dial antibacterial soap and water prior to dressing change. Peri-Wound Care Triad Hydrophilic Wound Dressing Tube, 6 (oz) Discharge Instruction: Apply to periwound with each dressing change Lotrisone Discharge Instruction: thin layer under triad cream. Topical Primary Dressing Hydrofera Blue Ready Foam, 2.5 x2.5 in Discharge Instruction: Apply to wound bed as instructed Secondary Dressing ComfortFoam Border, 4x4 in (silicone border) Discharge Instruction: Apply over primary dressing as directed. Secured With Compression Wrap Compression Stockings Environmental education officer) Signed: 07/07/2021 5:19:53 PM By: Deon Pilling Entered By: Deon Pilling on 07/07/2021 08:03:13 -------------------------------------------------------------------------------- Wound Assessment Details Patient Name: Date of Service: [name], [name] 07/07/2021 7:30 [name]  Medical Record Number: [id number] Patient Account Number: [id number] Date of Birth/Sex: Treating RN: 08-Apr-1986 (35 y.o. [name] Primary Care Dalaya Suppa: [name], [name] Other Clinician: Referring Makayah Pauli: Treating Annamae Shivley/Extender: [name]: 287 Wound Status Wound Number: 51 Primary Etiology: Trauma, Other Wound Location: Left, Anterior Lower Leg Wound Status: Open Wounding Event: Trauma Comorbid History: Sleep Apnea, Hypertension, Paraplegia Date Acquired: 01/26/2021 Weeks Of Treatment: 20 Clustered Wound: No Photos Photo Uploaded By: Donavan Burnet on 07/07/2021 16:54:32 Wound Measurements Length: (cm) 1.5 Width: (cm) 0.7 Depth: (cm) 0.1 Area: (cm) 0.825 Volume: (cm) 0.082 % Reduction in Area: -150% % Reduction  in Volume: -148.5% Epithelialization: Small (1-33%) Tunneling: No Undermining: No Wound Description Classification: Full Thickness Without Exposed Support Structures Wound Margin: Distinct, outline attached Exudate Amount: Medium Exudate Type: Serosanguineous Exudate Color: red, brown Foul Odor After Cleansing: No Slough/Fibrino No Wound Bed Granulation Amount: Large (67-100%) Exposed Structure Granulation Quality: Red, Hyper-granulation, Friable Fascia Exposed: No Necrotic Amount: None Present (0%) Fat Layer (Subcutaneous Tissue) Exposed: Yes Tendon Exposed: No Muscle Exposed: No Joint Exposed: No Bone Exposed: No Treatment Notes Wound #51 (Lower Leg) Wound Laterality: Left, Anterior Cleanser Soap and Water Discharge Instruction: May shower and wash wound with dial antibacterial soap and water prior to dressing change. Peri-Wound Care Topical Primary Dressing Hydrofera Blue Ready Foam, 2.5 x2.5 in Discharge Instruction: Apply to wound bed as instructed Secondary Dressing ComfortFoam Border, 4x4 in (silicone border) Discharge Instruction: Apply over primary dressing as directed. Secured With Compression Wrap Compression Stockings Environmental education officer) Signed: 07/07/2021 5:19:53 PM By: Deon Pilling Entered By: Deon Pilling on 07/07/2021 08:01:06 -------------------------------------------------------------------------------- Wound Assessment Details Patient Name: Date of Service: [name], [name] 07/07/2021 7:30 [name] Medical Record Number: [id number] Patient Account Number: [id number] Date of Birth/Sex: Treating RN: 10-04-1986 (35 y.o. [name] Primary Care Tavarus Poteete: [name], [name] Other Clinician: Referring Chauncy Mangiaracina: Treating Auburn Hert/Extender: [name]: 287 Wound Status Wound Number: 52 Primary Etiology: Trauma, Other Wound Location: Right, Dorsal Foot Wound Status: Open Wounding Event: Gradually  Appeared Comorbid History: Sleep Apnea, Hypertension, Paraplegia Date Acquired: 03/27/2021 Weeks Of Treatment: 14 Clustered Wound: No Photos Photo Uploaded By: Donavan Burnet on 07/07/2021 16:53:46 Wound Measurements Length: (cm) 1.5 Width: (cm) 4 Depth: (cm) 0.1 Area: (cm) 4.712 Volume: (cm) 0.471 % Reduction in Area: -150% % Reduction in Volume: -150.5% Epithelialization: None Tunneling: No Undermining: No Wound Description Classification: Full Thickness Without Exposed Support Structures Wound Margin: Distinct, outline attached Exudate Amount: Medium Exudate Type: Serosanguineous Exudate Color: red, brown Foul Odor After Cleansing: No Slough/Fibrino Yes Wound Bed Granulation Amount: Small (1-33%) Exposed Structure Granulation Quality: Pink Fascia Exposed: No Necrotic Amount: Large (67-100%) Fat Layer (Subcutaneous Tissue) Exposed: Yes Necrotic Quality: Adherent Slough Tendon Exposed: No Muscle Exposed: No Joint Exposed: No Bone Exposed: No Treatment Notes Wound #52 (Foot) Wound Laterality: Dorsal, Right Cleanser Soap and Water Discharge Instruction: May shower and wash wound with dial antibacterial soap and water prior to dressing change. Peri-Wound Care Topical Primary Dressing Hydrofera Blue Ready Foam, 2.5 x2.5 in Discharge Instruction: Apply to wound bed as instructed Secondary Dressing ComfortFoam Border, 4x4 in (silicone border) Discharge Instruction: Apply over primary dressing as directed. Secured With Compression Wrap Compression Stockings Environmental education officer) Signed: 07/07/2021 5:19:53 PM By: Deon Pilling Entered By: Deon Pilling on 07/07/2021 08:00:09 -------------------------------------------------------------------------------- Wound Assessment Details Patient Name: Date of Service: [name], [name] 07/07/2021 7:30 [name] Medical Record Number: [id number] Patient Account Number: [id number] Date  of Birth/Sex: Treating  RN: 05/10/1986 (35 y.o. [name] Primary Care Venola Castello: [place], [place] Other Clinician: Referring Shaydon Lease: Treating Zeb Rawl/Extender: Dutch Gray, [name]: 287 Wound Status Wound Number: 54 Primary Etiology: Pressure Ulcer Wound Location: Left, Distal Ischium Wound Status: Open Wounding Event: Pressure Injury Comorbid History: Sleep Apnea, Hypertension, Paraplegia Date Acquired: 05/11/2021 Weeks Of Treatment: 8 Clustered Wound: No Photos Photo Uploaded By: Donavan Burnet on 07/07/2021 16:53:47 Wound Measurements Length: (cm) 3.2 Width: (cm) 0.7 Depth: (cm) 0.1 Area: (cm) 1.759 Volume: (cm) 0.176 % Reduction in Area: -24.4% % Reduction in Volume: -24.8% Epithelialization: None Tunneling: No Undermining: No Wound Description Classification: Category/Stage III Wound Margin: Distinct, outline attached Exudate Amount: Medium Exudate Type: Sanguinous Exudate Color: red Foul Odor After Cleansing: No Slough/Fibrino No Wound Bed Granulation Amount: Large (67-100%) Exposed Structure Granulation Quality: Red, Friable Fascia Exposed: No Necrotic Amount: None Present (0%) Fat Layer (Subcutaneous Tissue) Exposed: Yes Tendon Exposed: No Muscle Exposed: No Joint Exposed: No Bone Exposed: No Treatment Notes Wound #54 (Ischium) Wound Laterality: Left, Distal Cleanser Soap and Water Discharge Instruction: May shower and wash wound with dial antibacterial soap and water prior to dressing change. Peri-Wound Care Triad Hydrophilic Wound Dressing Tube, 6 (oz) Discharge Instruction: Apply to periwound with each dressing change Lotrisone Discharge Instruction: apply under the triad cream. Topical Primary Dressing Hydrofera Blue Ready Foam, 2.5 x2.5 in Discharge Instruction: Apply to wound bed as instructed Secondary Dressing ComfortFoam Border, 4x4 in (silicone border) Discharge Instruction: Apply over primary dressing as  directed. Secured With Compression Wrap Compression Stockings Environmental education officer) Signed: 07/07/2021 5:19:53 PM By: Deon Pilling Entered By: Deon Pilling on 07/07/2021 08:04:10 -------------------------------------------------------------------------------- Wound Assessment Details Patient Name: Date of Service: [name], [name] 07/07/2021 7:30 [name] Medical Record Number: [id number] Patient Account Number: [id number] Date of Birth/Sex: Treating RN: [birth year] (35 y.o. [name] Primary Care Tayen Narang: [name], [name] Other Clinician: Referring Mercadies Co: Treating Kielyn Kardell/Extender: Dutch Gray, [name]: 287 Wound Status Wound Number: 55 Primary Etiology: Neuropathic Ulcer-Non Diabetic Wound Location: Left T Great oe Wound Status: Open Wounding Event: Blister Comorbid History: Sleep Apnea, Hypertension, Paraplegia Date Acquired: 05/26/2021 Weeks Of Treatment: 6 Clustered Wound: No Photos Photo Uploaded By: Donavan Burnet on 07/07/2021 16:51:09 Wound Measurements Length: (cm) 0.8 Width: (cm) 1.2 Depth: (cm) 0.2 Area: (cm) 0.754 Volume: (cm) 0.151 % Reduction in Area: 3.9% % Reduction in Volume: -91.1% Epithelialization: None Tunneling: No Undermining: Yes Starting Position (o'clock): 6 Ending Position (o'clock): 12 Maximum Distance: (cm) 0.2 Wound Description Classification: Full Thickness Without Exposed Support Structures Wound Margin: Distinct, outline attached Exudate Amount: Medium Exudate Type: Serosanguineous Exudate Color: red, brown Foul Odor After Cleansing: No Slough/Fibrino No Wound Bed Granulation Amount: Small (1-33%) Exposed Structure Granulation Quality: Pink Fascia Exposed: No Necrotic Amount: Large (67-100%) Fat Layer (Subcutaneous Tissue) Exposed: Yes Necrotic Quality: Adherent Slough Tendon Exposed: No Muscle Exposed: No Joint Exposed: No Bone Exposed: No Treatment Notes Wound #55 (Toe  Great) Wound Laterality: Left Cleanser Soap and Water Discharge Instruction: May shower and wash wound with dial antibacterial soap and water prior to dressing change. Peri-Wound Care Topical Primary Dressing Hydrofera Blue Classic Foam, 2x2 in Discharge Instruction: Moisten with saline prior to applying to wound bed Secondary Dressing ComfortFoam Border, 4x4 in (silicone border) Discharge Instruction: Apply over primary dressing as directed. Secured With Compression Wrap Compression Stockings Circaid Juxta Lite Compression Wrap Quantity: 1 Left Leg Compression Amount: 30-40 mmHg Discharge Instruction: Apply Circaid Juxta Lite Compression  Wrap daily as instructed. Apply first thing in the morning, remove at night before bed. Add-Ons Electronic Signature(s) Signed: 07/07/2021 5:19:53 PM By: Deon Pilling Entered By: Deon Pilling on 07/07/2021 08:01:52 -------------------------------------------------------------------------------- Vitals Details Patient Name: Date of Service: [name], [name] 07/07/2021 7:30 [name] Medical Record Number: [id number] Patient Account Number: [id number] Date of Birth/Sex: Treating RN: January 09, 1986 (35 y.o. [name] Primary Care [place]: [place], [place] Other Clinician: Referring Leodis Alcocer: Treating Hester Joslin/Extender: [name]: 287 Vital Signs Time Taken: 07:48 Temperature (F): 98.3 Height (in): 70 Pulse (bpm): 123 Weight (lbs): 216 Respiratory Rate (breaths/min): 20 Body Mass Index (BMI): 31 Blood Pressure (mmHg): 128/79 Reference Range: 80 - 120 mg / dl Electronic Signature(s) Signed: 07/07/2021 5:19:53 PM By: Deon Pilling Entered By: Deon Pilling on 07/07/2021 07:59:00

## 2021-07-14 ENCOUNTER — Encounter (HOSPITAL_BASED_OUTPATIENT_CLINIC_OR_DEPARTMENT_OTHER): Payer: 59 | Admitting: Internal Medicine

## 2021-07-21 ENCOUNTER — Other Ambulatory Visit: Payer: Self-pay

## 2021-07-21 ENCOUNTER — Encounter (HOSPITAL_BASED_OUTPATIENT_CLINIC_OR_DEPARTMENT_OTHER): Payer: 59 | Admitting: Internal Medicine

## 2021-07-21 DIAGNOSIS — I87332 Chronic venous hypertension (idiopathic) with ulcer and inflammation of left lower extremity: Secondary | ICD-10-CM | POA: Diagnosis not present

## 2021-07-22 NOTE — Progress Notes (Signed)
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 periwound erythema is very mild. We have been using Hydrofera Blue. Wound is making progress especially in the superior 50% 05/05/17; he continues to have very gradual improvement in the left lateral venous insufficiency wound. Both in terms with an length rings are improving. I debrided this every 2 weeks with #5 curet and we have been using Hydrofera Blue and again making good progress With regards to the wounds between his right first and second toe which I thought might of been tinea pedis he is not making as much progress very dry scaly skin over the area. Also the area at the base of the left first and second toe in a similar condition 05/12/17; continued gradual improvement in the refractory left lateral venous insufficiency wound on the left. Dimension smaller. Surface still requiring debridement using Hydrofera Blue 05/19/17; continued gradual improvement in the refractory left lateral venous ulceration. Careful inspection of the wound bed underlying rumination suggested some degree of epithelialization over the surface  no debridement indicated. Continue Hydrofera Blue difficult areas between his toes first and third on the left than first and second on the right. I'm going to change to silver alginate from silver collagen. Continue ketoconazole as I suspect underlying tinea pedis 05/26/17; left lateral leg venous insufficiency wound. We've been using Hydrofera Blue. I believe that there is expanding epithelialization over the surface of the wound albeit not coming from the wound circumference. This  is a bit of an odd situation in which the epithelialization seems to be coming from the surface of the wound rather than in the exact circumference. There is still small open areas mostly along the lateral margin of the wound. He has unchanged areas between the left first and second and the right first second toes which I been treating for tenia pedis 06/02/17; left lateral leg venous insufficiency wound. We have been using Hydrofera Blue. Somewhat smaller from the wound circumference. The surface of the wound remains a bit on it almost epithelialized sedation in appearance. I use an open curette today debridement in the surface of all of this especially the edges Small open wounds remaining on the dorsal right first and second toe interspace and the plantar left first second toe and her face on the left 06/09/17; wound on the left lateral leg continues to be smaller but very gradual and very dry surface using Hydrofera Blue 06/16/17 requires weekly debridements now on the left lateral leg although this continues to contract. I changed to silver collagen last week because of dryness of the wound bed. Using Iodoflex to the areas on his first and second toes/web space bilaterally 06/24/17; patient with history of paraplegia also chronic venous insufficiency with lymphedema. Has a very difficult wound on the left lateral leg. This has been gradually reducing in terms of with but comes in with a very dry adherent surface. High switch  to silver collagen a week or so ago with hydrogel to keep the area moist. This is been refractory to multiple dressing attempts. He also has areas in his first and second toes bilaterally in the anterior and posterior web space. I had been using Iodoflex here after a prolonged course of silver alginate with ketoconazole was ineffective [question tinea pedis] 07/14/17; patient arrives today with a very difficult adherent material over his left lateral lower leg wound. He also has surrounding erythema and poorly controlled edema. He was switched his Santyl last visit which the nurses are applying once during his doctor visit and once on a nurse visit. He was also reduced to 2 layer compression I'm not exactly sure of the issue here. 07/21/17; better surface today after 1 week of Iodoflex. Significant cellulitis that we treated last week also better. [Doxycycline] 07/28/17 better surface today with now 2 weeks of Iodoflex. Significant cellulitis treated with doxycycline. He has now completed the doxycycline and he is back to his usual degree of chronic venous inflammation/stasis dermatitis. He reminds me he has had ablations surgery here 08/04/17; continued improvement with Iodoflex to the left lateral leg wound in terms of the surface of the wound although the dimensions are better. 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 Problematic areas on the plantar aspect of the first second toe web space on the left and the dorsal aspect of the first second toe web space on the right. At one point I felt these were probably related to chronic fungal infections in treated him aggressively for this although we have not made any improvement here. 08/11/17; left lateral leg. Surface continues to improve with the Iodoflex although we are not seeing much improvement in overall wound dimensions. Areas on his plantar left foot and right foot show no  improvement. In fact the right foot looks somewhat worse 08/18/17; left lateral leg. We changed to [facility] Blue last week after a prolonged course of Iodoflex which helps get the surface better. It  appears that the wound with is improved. Continue with difficult areas on the left dorsal first second and plantar first second on the right 09/01/17; patient arrives in clinic today having had a temperature of 103 yesterday. He was seen in the ER and [facility]. The patient was concerned he could have cellulitis again in the right leg however they diagnosed him with a UTI and he is now on Keflex. He has a history of cellulitis which is been recurrent and difficult but this is been in the left leg, in the past 5 use doxycycline. He does in and out catheterizations at home which are risk factors for UTI 09/08/17; patient will be completing his Keflex this weekend. The erythema on the left leg is considerably better. He has a new wound today on the medial part of the right leg small superficial almost looks like a skin tear. He has worsening of the area on the right dorsal first and second toe. His major area on the left lateral leg is better. Using Hydrofera Blue on all areas 09/15/17; gradual reduction in width on the long wound in the left lateral leg. No debridement required. He also has wounds on the plantar aspect of his left first second toe web space and on the dorsal aspect of the right first second toe web space. 09/22/17; there continues to be very gradual improvements in the dimensions of the left lateral leg wound. He hasn't round erythematous spot with might be pressure on his wheelchair. There is no evidence obviously of infection no purulence no warmth He has a dry scaled area on the plantar aspect of the left first second toe Improved area on the dorsal right first second toe. 09/29/17; left lateral leg wound continues to improve in dimensions mostly with an is still a fairly long but  increasingly narrow wound. He has a dry scaled area on the plantar aspect of his left first second toe web space Increasingly concerning area on the dorsal right first second toe. In fact I am concerned today about possible cellulitis around this wound. The areas extending up his second toe and although there is deformities here almost appears to abut on the nailbed. 10/06/17; left lateral leg wound continues to make very gradual progress. Tissue culture I did from the right first second toe dorsal foot last time grew MRSA and enterococcus which was vancomycin sensitive. This was not sensitive to clindamycin or doxycycline. He is allergic to Zyvox and sulfa we have therefore arrange for him to have dalvance infusion tomorrow. He is had this in the past and tolerated it well 10/20/17; left lateral leg wound continues to make decent progress. This is certainly reduced in terms of with there is advancing epithelialization.The cellulitis in the right foot looks better although he still has a deep wound in the dorsal aspect of the first second toe web space. Plantar left first toe web space on the left I think is making some progress 10/27/17; left lateral leg wound continues to make decent progress. Advancing epithelialization.using Hydrofera Blue The right first second toe web space wound is better-looking using silver alginate Improvement in the left plantar first second toe web space. Again using silver alginate 11/03/17 left lateral leg wound continues to make decent progress albeit slowly. Using [facility] The right per second toe web space continues to be a very problematic looking punched out wound. I obtained a piece of tissue for deep culture I did extensively treated this for fungus. It is difficult to  imagine that this is a pressure area as the patient states other than going outside he doesn't really wear shoes at home The left plantar first second toe web space looked fairly senescent.  Necrotic edges. This required debridement change to [facility] Blue to all wound areas 11/10/17; left lateral leg wound continues to contract. Using Hydrofera Blue On the right dorsal first second toe web space dorsally. Culture I did of this area last week grew MRSA there is not an easy oral option in this patient was multiple antibiotic allergies or intolerances. This was only a rare culture isolate I'm therefore going to use Bactroban under silver alginate On the left plantar first second toe web space. Debridement is required here. This is also unchanged 11/17/17; left lateral leg wound continues to contract using Hydrofera Blue this is no longer the major issue. The major concern here is the right first second toe web space. He now has an open area going from dorsally to the plantar aspect. There is now wound on the inner lateral part of the first toe. Not a very viable surface on this. There is erythema spreading medially into the forefoot. No major change in the left first second toe plantar wound 11/24/17; left lateral leg wound continues to contract using Hydrofera Blue. Nice improvement today The right first second toe web space all of this looks a lot less angry than last week. I have given him clindamycin and topical Bactroban for MRSA and terbinafine for the possibility of underlining tinea pedis that I could not control with ketoconazole. Looks somewhat better The area on the plantar left first second toe web space is weeping with dried debris around the wound 12/01/17; left lateral leg wound continues to contract he Hydrofera Blue. It is becoming thinner in terms of with nevertheless it is making good improvement. The right first second toe web space looks less angry but still a large necrotic-looking wounds starting on the plantar aspect of the right foot extending between the toes and now extensively on the base of the right second toe. I gave him clindamycin and topical Bactroban for  MRSA anterior benefiting for the possibility of underlying tinea pedis. Not looking better today The area on the left first/second toe looks better. Debrided of necrotic debris 12/05/17* the patient was worked in urgently today because over the weekend he found blood on his incontinence bad when he woke up. He was found to have an ulcer by his wife who does most of his wound care. He came in today for [place] to look at this. He has not had a history of wounds in his buttocks in spite of his paraplegia. 12/08/17; seen in follow-up today at his usual appointment. He was seen earlier this week and found to have a new wound on his buttock. We also follow him for wounds on the left lateral leg, left first second toe web space and right first second toe web space 12/15/17; we have been using Hydrofera Blue to the left lateral leg which has improved. The right first second toe web space has also improved. Left first second toe web space plantar aspect looks stable. The left buttock has worsened using Santyl. Apparently the buttock has drainage 12/22/17; we have been using Hydrofera Blue to the left lateral leg which continues to improve now 2 small wounds separated by normal skin. He tells [place] he had a fever up to 100 yesterday he is prone to UTIs but has not noted anything different. He does  in and out catheterizations. The area between the first and second toes today does not look good necrotic surface covered with what looks to be purulent drainage and erythema extending into the third toe. I had gotten this to something that I thought look better last time however it is not look good today. He also has a necrotic surface over the buttock wound which is expanded. I thought there might be infection under here so I removed a lot of the surface with a #5 curet though nothing look like it really needed culturing. He is been using Santyl to this area 12/27/17; his original wound on the left lateral leg continues to  improve using Hydrofera Blue. I gave him samples of Baxdella although he was unable to take them out of fear for an allergic reaction ["lump in his throat"].the culture I did of the purulent drainage from his second toe last week showed both enterococcus and a set Enterobacter I was also concerned about the erythema on the bottom of his foot although paradoxically although this looks somewhat better today. 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 We've been using silver alginate here. They came up today that he sleeps in a recliner, I'm not sure why but I asked him to stop this 01/03/18; his original wound we've been using Hydrofera Blue is now separated into 2 areas. Ulcer on his left buttock is better he is off the recliner and sleeping in bed Finally both wound areas between his first and second toes also looks some better 01/10/18; his original wound on the left lateral leg is now separated into 2 wounds we've been using Hydrofera Blue Ulcer on his left buttock has some drainage. There is a small probing site going into muscle layer superiorly.using silver alginate -He arrives today with a deep tissue injury on the left heel The wound on the dorsal aspect of his first second toe on the left looks a lot betterusing silver alginate ketoconazole The area on the first second toe web space on the right also looks a lot bette 01/17/18; his original wound on the left lateral leg continues to progress using Hydrofera Blue Ulcer on his left buttock also is smaller surface healthier except for a small probing site going into the muscle layer superiorly. 2.4 cm of tunneling in this area DTI on his left heel we have only been offloading. Looks better than last week no threatened open no evidence of infection the wound on the dorsal aspect of the first second toe on the left continues to look like it's regressing we  have only been using silver alginate and terbinafine orally The area in the first second toe web space on the right also looks to be a lot better using silver alginate and terbinafine I think this was prompted by tinea pedis 01/31/18; the patient was hospitalized in [place] last week apparently for a complicated UTI. He was discharged on cefepime he does in and out catheterizations. In the hospital he was discovered M I don't mild elevation of AST and ALT and the terbinafine was stopped.predictably the pressure ulcer on s his buttock looks betterusing silver alginate. The area on the left lateral leg also is better using Hydrofera Blue. The area between the first and second toes on the left better. First and second toes on the right still substantial but better. Finally the DTI on the left heel has held together  and looks like it's resolving 02/07/18-he is here in follow-up evaluation for multiple ulcerations. He has new injury to the lateral aspect of the last issue a pressure ulcer, he states this is from adhesive removal trauma. He states he has tried multiple adhesive products with no success. All other ulcers appear stable. The left heel DTI is resolving. We will continue with same treatment plan and follow-up next week. 02/14/18; follow-up for multiple areas. He has a new area last week on the lateral aspect of his pressure ulcer more over the posterior trochanter. The original pressure ulcer looks quite stable has healthy granulation. 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 Almost fully epithelialized on the original superior area using Hydrofera Blue DTI on the left heel has peeled off this week to reveal a small superficial wound under denuded skin and subcutaneous tissue Both areas between the first and second toes look better including nothing open on the left 02/21/18; The patient's  wounds on his left ischial tuberosity and posterior left greater trochanter actually looked better. He has a large area of irritation around the area which I think is contact dermatitis. I am doubtful that this is fungal His original wound on the left lateral calf continues to improve we have been using Hydrofera Blue There is no open area in the left first second toe web space although there is a lot of thick callus The DTI on the left heel required debridement today of necrotic surface eschar and subcutaneous tissue using silver alginate Finally the area on the right first second toe webspace continues to contract using silver alginate and ketoconazole 02/28/18 Left ischial tuberosity wounds look better using silver alginate. Original wound on the left calf only has one small open area left using Hydrofera Blue DTI on the left heel required debridement mostly removing skin from around this wound surface. Using silver alginate The areas on the right first/second toe web space using silver alginate and ketoconazole 03/08/18 on evaluation today patient appears to be doing decently well as best I can tell in regard to his wounds. This is the first time that I have seen him as he generally is followed by Dr. Dellia Nims. With that being said none of his wounds appear to be infected he does have an area where there is some skin covering what appears to be a new wound on the left dorsal surface of his great toe. This is right at the nail bed. With that being said I do believe that debrided away some of the excess skin can be of benefit in this regard. Otherwise he has been tolerating the dressing changes without complication. 03/14/18; patient arrives today with the multiplicity of wounds that we are following. He has not been systemically unwell Original wound on the left lateral calf now only has 2 small open areas we've been using Hydrofera Blue which should continue The deep tissue injury on the left heel  requires debridement today. We've been using silver alginate The left first second toe and the right first second toe are both are reminiscence what I think was tinea pedis. Apparently some of the callus Surface between the toes was removed last week when it started draining. Purulent drainage coming from the wound on the ischial tuberosity on the left. 03/21/18-He is here in follow-up evaluation for multiple wounds. There is improvement, he is currently taking doxycycline, culture obtained last week grew tetracycline  sensitive MRSA. He tolerated debridement. The only change to last week's recommendations is to discontinue antifungal cream between toes. He will follow-up next week 03/28/18; following up for multiple wounds;Concern this week is streaking redness and swelling in the right foot. He is going to need antibiotics for this. 03/31/18; follow-up for right foot cellulitis. Streaking redness and swelling in the right foot on 03/28/18. 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 I put him on clindamycin 300 mg every 6 and brought him in for a quick check. He has an open wound between his first and second toes on the right foot as a potential source. 04/04/18; Right foot cellulitis is resolving he is completing clindamycin. This is truly good news Left lateral calf wound which is initial wound only has one small open area inferiorly this is close to healing out. He has compression stockings. We will use Hydrofera Blue right down to the epithelialization of this Nonviable surface on the left heel which was initially pressure with a DTI. We've been using Hydrofera Blue. I'm going to switch this back to silver alginate Left first second toe/tinea pedis this looks better using silver alginate Right first second toe tinea pedis using silver alginate Large pressure ulcers on theLeft ischial tuberosity. Small wound here Looks better. I am uncertain about the surface over the large wound.  Using silver alginate 04/11/18; Cellulitis in the right foot is resolved Left lateral calf wound which was his original wounds still has 2 tiny open areas remaining this is just about closed Nonviable surface on the left heel is better but still requires debridement Left first second toe/tinea pedis still open using silver alginate Right first second toe wound tinea pedis I asked him to go back to using ketoconazole and silver alginate Large pressure ulcers on the left ischial tuberosity this shear injury here is resolved. Wound is smaller. No evidence of infection using silver alginate 04/18/18; Patient arrives with an intense area of cellulitis in the right mid lower calf extending into the right heel area. Bright red and warm. Smaller area on the left anterior leg. He has a significant history of MRSA. He will definitely need antibioticsdoxycycline He now has 2 open areas on the left ischial tuberosity the original large wound and now a satellite area which I think was above his initial satellite areas. Not a wonderful surface on this satellite area surrounding erythema which looks like pressure related. His left lateral calf wound again his original wound is just about closed Left heel pressure injury still requiring debridement Left first second toe looks a lot better using silver alginate Right first second toe also using silver alginate and ketoconazole cream also looks better 04/20/18; the patient was worked in early today out of concerns with his cellulitis on the right leg. I had started him on doxycycline. This was 2 days ago. His wife was concerned about the swelling in the area. Also concerned about the left buttock. He has not been systemically unwell no fever chills. No nausea vomiting or diarrhea 04/25/18; the patient's left buttock wound is continued to deteriorate he is using Hydrofera Blue. He is still completing clindamycin for the cellulitis on the right leg although all of this  looks better. 05/02/18 Left buttock wound still with a lot of drainage and a very tightly adherent fibrinous necrotic surface. He has a deeper area superiorly The left lateral calf wound is still closed DTI wound on the left heel necrotic surface especially the circumference using  Iodoflex Areas between his left first second toe and right first second toe both look better. Dorsally and the right first second toe he had a necrotic surface although at smaller. In using silver alginate and ketoconazole. I did a culture last week which was a deep tissue culture of the reminiscence of the open wound on the right first second toe dorsally. This grew a few Acinetobacter and a few methicillin-resistant staph aureus. Nevertheless the area actually this week looked better. I didn't feel the need to specifically address this at least in terms of systemic antibiotics. 05/09/18; wounds are measuring larger more drainage per our intake. We are using Santyl covered with alginate on the large superficial buttock wounds, Iodosorb on the left heel, ketoconazole and silver alginate to the dorsal first and second toes bilaterally. 05/16/18; The area on his left buttock better in some aspects although the area superiorly over the ischial tuberosity required an extensive debridement.using Santyl Left heel appears stable. Using Iodoflex The areas between his first and second toes are not bad however there is spreading erythema up the dorsal aspect of his left foot this looks like cellulitis again. He is insensate the erythema is really very brilliant.o Erysipelas He went to see an allergist days ago because he was itching part of this he had lab work done. This showed a white count of 15.1 with 70% neutrophils. Hemoglobin of 11.4 and a platelet count of 659,000. Last white count we had in Epic was a 2-1/2 years ago which was 25.9 but he was ill at the time. He was able to show me some lab work that was done by his primary  physician the pattern is about the same. 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 But prompted me to go ahead and do x-rays of both feet and the pelvis rule out osteomyelitis. He also had a comprehensive metabolic panel this was reasonably normal his albumin was 3.7 liver function tests BUN/creatinine all normal 05/23/18; x-rays of both his feet from last week were negative for underlying pulmonary abnormality. The x-ray of his pelvis however showed mild irregularity in the left ischial which may represent some early osteomyelitis. The wound in the left ischial continues to get deeper clearly now exposed muscle. Each week necrotic surface material over this area. Whereas the rest of the wounds do not look so bad. The left ischial wound we have been using Santyl and calcium alginate T the left heel surface necrotic debris using Iodoflex o The left lateral leg is still healed Areas on the left dorsal foot and the right dorsal foot are about the same. There is some inflammation on the left which might represent contact dermatitis, fungal dermatitis I am doubtful cellulitis although this looks better than last week 05/30/18; CT scan done at Hospital did not show any osteomyelitis or abscess. Suggested the possibility of underlying cellulitis although I don't see a lot of evidence of this at the bedside The wound itself on the left buttock/upper thigh actually looks somewhat better. No debridement Left heel also looks better no debridement continue Iodoflex Both dorsal first second toe spaces appear better using Lotrisone. Left still required debridement 06/06/18; Intake reported some purulent looking drainage from the left gluteal wound. Using Santyl and calcium alginate Left heel looks better although still a nonviable surface requiring debridement The left dorsal foot first/second webspace actually expanding and somewhat deeper. I may consider doing a shave  biopsy of this  area Right dorsal foot first/second webspace appears stable to improved. Using Lotrisone and silver alginate to both these areas 06/13/18 Left gluteal surface looks better. Now separated in the 2 wounds. No debridement required. Still drainage. We'll continue silver alginate Left heel continues to look better with Iodoflex continue this for at least another week Of his dorsal foot wounds the area on the left still has some depth although it looks better than last week. We've been using Lotrisone and silver alginate 06/20/18 Left gluteal continues to look better healthy tissue Left heel continues to look better healthy granulation wound is smaller. He is using Iodoflex and his long as this continues continue the Iodoflex Dorsal right foot looks better unfortunately dorsal left foot does not. There is swelling and erythema of his forefoot. He had minor trauma to this several days ago but doesn't think this was enough to have caused any tissue injury. Foot looks like cellulitis, we have had this problem before 06/27/18 on evaluation today patient appears to be doing a little worse in regard to his foot ulcer. 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 Both of which would really be his only options for treating this infection. In the past he has been given and effusion of Orbactiv. This is done very well for him in the past again it's one time dosing IV antibiotic therapy. Subsequently I do believe this is something we're gonna need to see about doing at this point in time. Currently his other wounds seem to be doing somewhat better in my pinion I'm pretty happy in that regard. 07/03/18 on evaluation today patient's wounds actually appear to be doing fairly well. He has been tolerating the dressing changes without complication. All in all he seems to be  showing signs of improvement. In regard to the antibiotics he has been dealing with infectious disease since I saw him last week as far as getting this scheduled. In the end he's [facility] this coming Friday. In the meantime he has been continuing to perform the dressing changes in such as previous. There does not appear to be any evidence of infection worsengin at this time. 07/10/18; Since I last saw this man 2 weeks ago things have actually improved. IV antibiotics of resulted in less forefoot erythema although there is still some present. He is not systemically unwell Left buttock wounds 2 now have no depth there is increased epithelialization Using silver alginate Left heel still requires debridement using Iodoflex Left dorsal foot still with a sizable wound about the size of a border but healthy granulation Right dorsal foot still with a slitlike area using silver alginate 07/18/18; the patient's cellulitis in the left foot is improved in fact I think it is on its way to resolving. Left buttock wounds 2 both look better although the larger one has hypertension granulation we've been using silver alginate Left heel has some thick circumferential redundant skin over the wound edge which will need to be removed today we've been using Iodoflex Left dorsal foot is still a sizable wound required debridement using silver alginate The right dorsal foot is just about closed only a small open area remains here 07/25/18; left foot cellulitis is resolved Left buttock wounds 2 both look better. Hyper-granulation on the major area Left heel as some  debris over the surface but otherwise looks a healthier wound. Using silver collagen Right dorsal foot is just about closed 07/31/18; arrives with our intake nurse worried about purulent drainage from the buttock. We had hyper-granulation here last week His buttock wounds 2 continue to look better Left  heel some debris over the surface but measuring smaller. Right dorsal foot unfortunately has openings between the toes Left foot superficial wound looks less aggravated. 08/07/18 Buttock wounds continue to look better although some of her granulation and the larger medial wound. silver alginate Left heel continues to look a lot better.silver collagen Left foot superficial wound looks less stable. Requires debridement. He has a new wound superficial area on the foot on the lateral dorsal foot. Right foot looks better using silver alginate without Lotrisone 08/14/2018; patient was in the ER last week diagnosed with a UTI. He is now on Cefpodoxime and Macrodantin. Buttock wounds continued to be smaller. Using silver alginate Left heel continues to look better using silver collagen Left foot superficial wound looks as though it is improving Right dorsal foot area is just about healed. 08/21/2018; patient is completed his antibiotics for his UTI. He has 2 open areas on the buttocks. There is still not closed although the surface looks satisfactory. Using silver alginate Left heel continues to improve using silver collagen The bilateral dorsal foot areas which are at the base of his first and second toes/possible tinea pedis are actually stable on the left but worse on the right. The area on the left required debridement of necrotic surface. After debridement I obtained a specimen for PCR culture. The right dorsal foot which is been just about healed last week is now reopened 08/28/2018; culture done on the left dorsal foot showed coag negative staph both staph epidermidis and Lugdunensis. I think this is worthwhile initiating systemic treatment. I will use doxycycline given his long list of allergies. The area on the left heel slightly improved but still requiring debridement. The large wound on the buttock is just about closed whereas the smaller one is larger. Using silver alginate in this  area 09/04/2018; patient is completing his doxycycline for the left foot although this continues to be a very difficult wound area with very adherent necrotic debris. We are using silver alginate to all his wounds right foot left foot and the small wounds on his buttock, silver collagen on the left heel. 09/11/2018; once again this patient has intense erythema and swelling of the left forefoot. Lesser degrees of erythema in the right foot. He has a long list of allergies and intolerances. I will reinstitute doxycycline. 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 Using silver alginate Left heel also looks better using silver collagen Unfortunately both the areas on his feet look worse. The area on the left first second webspace is now gone through to the plantar part of his foot. The area on the left foot anteriorly is irritated with erythema and swelling in the forefoot. 09/25/2018 His wound on the left plantar heel looks better. Using silver collagen The area on the left buttock 2 small remnant areas. One is closed one is still open. Using silver alginate The areas between both his first and second toes look worse. This in spite of long-standing antifungal therapy with ketoconazole and silver alginate which should have antifungal activity He has small areas around his original wound on the left calf one is on the bottom of the  original scar tissue and one superiorly both of these are small and superficial but again given wound history in this site this is worrisome 10/02/2018 Left plantar heel continues to gradually contract using silver collagen Left buttock wound is unchanged using silver alginate The areas on his dorsal feet between his first and second toes bilaterally look about the same. I prescribed clindamycin ointment to see if we can address chronic staph colonization and also the underlying possibility of erythrasma The left lateral lower  extremity wound is actually on the lateral part of his ankle. Small open area here. We have been using silver alginate 10/09/2018; Left plantar heel continues to look healthy and contract. No debridement is required Left buttock slightly smaller with a tape injury wound just below which was new this week Dorsal feet somewhat improved I have been using clindamycin Left lateral looks lower extremity the actual open area looks worse although a lot of this is epithelialized. I am going to change to silver collagen today He has a lot more swelling in the right leg although this is not pitting not red and not particularly warm there is a lot of spasm in the right leg usually indicative of people with paralysis of some underlying discomfort. We have reviewed his vascular status from 2017 he had a left greater saphenous vein ablation. I wonder about referring him back to vascular surgery if the area on the left leg continues to deteriorate. 10/16/2018 in today for follow-up and management of multiple lower extremity ulcers. His left Buttock wound is much lower smaller and almost closed completely. The wound to the left ankle has began to reopen with Epithelialization and some adherent slough. He has multiple new areas to the left foot and leg. The left dorsal foot without much improvement. Wound present between left great webspace and 2nd toe. Erythema and edema present right leg. Right LE ultrasound obtained on 10/10/18 was negative for DVT . 10/23/2018; Left buttock is closed over. Still dry macerated skin but there is no open wound. I suspect this is chronic pressure/moisture Left lateral calf is quite a bit worse than when I saw this last. There is clearly drainage here he has macerated skin into the left plantar heel. We will change the primary dressing to alginate Left dorsal foot has some improvement in overall wound area. Still using clindamycin and silver alginate Right dorsal foot about the same  as the left using clindamycin and silver alginate The erythema in the right leg has resolved. He is DVT rule out was negative Left heel pressure area required debridement although the wound is smaller and the surface is health 10/26/2018 The patient came back in for his nurse check today predominantly because of the drainage coming out of the left lateral leg with a recent reopening of his original wound on the left lateral calf. He comes in today with a large amount of surrounding erythema around the wound extending from the calf into the ankle and even in the area on the dorsal foot. He is not systemically unwell. He is not febrile. Nevertheless this looks like cellulitis. We have been using silver alginate to the area. I changed him to a regular visit and I am going to prescribe him doxycycline. The rationale here is a long list of medication intolerances and a history of MRSA. I did not see anything that I thought would provide a valuable culture 10/30/2018 Follow-up from his appointment 4 days ago with really an extensive area of cellulitis in  the left calf left lateral ankle and left dorsal foot. I put him on doxycycline. He has a long list of medication allergies which are true allergy reactions. 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 In any case he is a lot better today. The erythema especially in the anterior and lateral left calf is better. He still has left ankle erythema. He also is complaining about increasing edema in the right leg we have only been using Kerlix Coban and he has been doing the wraps at home. Finally he has a spotty rash on the medial part of his upper left calf which looks like folliculitis or perhaps wrap occlusion type injury. Small superficial macules not pustules 11/06/18 patient arrives today with again a considerable degree of erythema around the wound on the left lateral calf extending into  the dorsal ankle and dorsal foot. This is a lot worse than when I saw this last week. He is on doxycycline really with not a lot of improvement. He has not been systemically unwell Wounds on the; left heel actually looks improved. Original area on the left foot and proximity to the first and second toes looks about the same. He has superficial areas on the dorsal foot, anterior calf and then the reopening of his original wound on the left lateral calf which looks about the same The only area he has on the right is the dorsal webspace first and second which is smaller. He has a large area of dry erythematous skin on the left buttock small open area here. 11/13/2018; the patient arrives in much better condition. The erythema around the wound on the left lateral calf is a lot better. Not sure whether this was the clindamycin or the TCA and ketoconazole or just in the improvement in edema control [stasis dermatitis]. In any case this is a lot better. The area on the left heel is very small and just about resolved using silver collagen we have been using silver alginate to the areas on his dorsal feet 11/20/2018; his wounds include the left lateral calf, left heel, dorsal aspects of both feet just proximal to the first second webspace. He is stable to slightly improved. I did not think any changes to his dressings were going to be necessary 11/27/2018 he has a reopening on the left buttock which is surrounded by what looks like tinea or perhaps some other form of dermatitis. The area on the left dorsal foot has some erythema around it I have marked this area but I am not sure whether this is cellulitis or not. Left heel is not closed. Left calf the reopening is really slightly longer and probably worse 1/13; in general things look better and smaller except for the left dorsal foot. Area on the left heel is just about closed, left buttock looks better only a small wound remains in the skin looks better [using  Lotrisone] 1/20; the area on the left heel only has a few remaining open areas here. Left lateral calf about the same in terms of size, left dorsal foot slightly larger right lateral foot still not closed. The area on the left buttock has no open wound and the surrounding skin looks a lot better 1/27; the area on the left heel is closed. Left lateral calf better but still requiring extensive debridements. The area on his left buttock is closed. He still has the open areas on the left dorsal foot which is  slightly smaller in the right foot which is slightly expanded. We have been using Iodoflex on these areas as well 2/3; left heel is closed. Left lateral calf still requiring debridement using Iodoflex there is no open area on his left buttock however he has dry scaly skin over a large area of this. Not really responding well to the Lotrisone. Finally the areas on his dorsal feet at the level of the first second webspace are slightly smaller on the right and about the same on the left. Both of these vigorously debrided with Anasept and gauze 2/10; left heel remains closed he has dry erythematous skin over the left buttock but there is no open wound here. Left lateral leg has come in and with. Still requiring debridement we have been using Iodoflex here. Finally the area on the left dorsal foot and right dorsal foot are really about the same extremely dry callused fissured areas. He does not yet have a dermatology appointment 2/17; left heel remains closed. He has a new open area on the left buttock. The area on the left lateral calf is bigger longer and still covered in necrotic debris. No major change in his foot areas bilaterally. I am awaiting for a dermatologist to look on this. We have been using ketoconazole I do not know that this is been doing any good at all. 2/24; left heel remains closed. The left buttock wound that was new reopening last week looks better. The left lateral calf appears  better also although still requires debridement. The major area on his foot is the left first second also requiring debridement. We have been putting Prisma on all wounds. I do not believe that the ketoconazole has done too much good for his feet. He will use Lotrisone I am going to give him a 2-week course of terbinafine. We still do not have a dermatology appointment 3/2 left heel remains closed however there is skin over bone in this area I pointed this out to him today. The left buttock wound is epithelialized but still does not look completely stable. The area on the left leg required debridement were using silver collagen here. With regards to his feet we changed to Lotrisone last week and silver alginate. 3/9; left heel remains closed. Left buttock remains closed. The area on the right foot is essentially closed. The left foot remains unchanged. Slightly smaller on the left lateral calf. Using silver collagen to both of these areas 3/16-Left heel remains closed. Area on right foot is closed. Left lateral calf above the lateral malleolus open wound requiring debridement with easy bleeding. Left dorsal wound proximal to first toe also debrided. Left ischial area open new. Patient has been using Prisma with wrapping every 3 days. Dermatology appointment is apparently tomorrow.Patient has completed his terbinafine 2-week course with some apparent improvement according to him, there is still flaking and dry skin in his foot on the left 3/23; area on the right foot is reopened. The area on the left anterior foot is about the same still a very necrotic adherent surface. He still has the area on the left leg and reopening is on the left buttock. He apparently saw dermatology although I do not have a note. According to the patient who is usually fairly well informed they did not have any good ideas. Put him on oral terbinafine which she is been on before. 3/30; using silver collagen to all wounds.  Apparently his dermatologist put him on doxycycline and rifampin presumably some culture  grew staph. I do not have this result. He remains on terbinafine although I have used terbinafine on him before 4/6; patient has had a fairly substantial reopening on the right foot between the first and second toes. He is finished his terbinafine and I believe is on doxycycline and rifampin still as prescribed by dermatology. We have been using silver collagen to all his wounds although the patient reports that he thinks silver alginate does better on the wounds on his buttock. 4/13; the area on his left lateral calf about the same size but it did not require debridement. Left dorsal foot just proximal to the webspace between the first and second toes is about the same. Still nonviable surface. I note some superficial bronze discoloration of the dorsal part of his foot Right dorsal foot just proximal to the first and second toes also looks about the same. I still think there may be the same discoloration I noted above on the left Left buttock wound looks about the same 4/20; left lateral calf appears to be gradually contracting using silver collagen. 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 The left dorsal foot wound is debrided of tightly adherent necrotic debris and really cleans up quite nicely. The right area is worse with expansion. I did not debride this it is now over the base of the second toe The area on his left buttock is smaller no debridement is required using silver collagen 5/4; left calf continues to make good progress. He arrives with erythema around the wounds on his dorsal foot which even extends to the plantar aspect. Very concerning for coexistent infection. He is finished the erythromycin I gave him for possible erythrasma this does not seem to have helped. The area on the left foot is about the same base of the dorsal toes Is  area on the buttock looks improved on the left 5/11; left calf and left buttock continued to make good progress. Left foot is about the same to slightly improved. Major problem is on the right foot. He has not had an x-ray. Deep tissue culture I did last week showed both Enterobacter and E. coli. I did not change the doxycycline I put him on empirically although neither 1 of these were plated to doxycycline. He arrives today with the erythema looking worse on both the dorsal and plantar foot. Macerated skin on the bottom of the foot. he has not been systemically unwell 5/18-Patient returns at 1 week, left calf wound appears to be making some progress, left buttock wound appears slightly worse than last time, left foot wound looks slightly better, right foot redness is marginally better. X-ray of both feet show no air or evidence of osteomyelitis. Patient is finished his Omnicef and terbinafine. He continues to have macerated skin on the bottom of the left foot as well as right 5/26; left calf wound is better, left buttock wound appears to have multiple small superficial open areas with surrounding macerated skin. X-rays that I did last time showed no evidence of osteomyelitis in either foot. He is finished cefdinir and doxycycline. I do not think that he was on terbinafine. He continues to have a large superficial open area on the right foot anterior dorsal and slightly between the first and second toes. I did send him to dermatology 2 months ago or so wondering about whether they would do a fungal scraping. I do not believe they did but did do a culture. We have been  using silver alginate to the toe areas, he has been using antifungals at home topically either ketoconazole or Lotrisone. We are using silver collagen on the left foot, silver alginate on the right, silver collagen on the left lateral leg and silver alginate on the left buttock 6/1; left buttock area is healed. We have the left dorsal  foot, left lateral leg and right dorsal foot. We are using silver alginate to the areas on both feet and silver collagen to the area on his left lateral calf 6/8; the left buttock apparently reopened late last week. He is not really sure how this happened. He is tolerating the terbinafine. Using silver alginate to all wounds 6/15; left buttock wound is larger than last week but still superficial. Came in the clinic today with a report of purulence from the left lateral leg I did not identify any infection Both areas on his dorsal feet appear to be better. He is tolerating the terbinafine. Using silver alginate to all wounds 6/22; left buttock is about the same this week, left calf quite a bit better. His left foot is about the same however he comes in with erythema and warmth in the right forefoot once again. Culture that I gave him in the beginning of May showed Enterobacter and E. coli. I gave him doxycycline and things seem to improve although neither 1 of these organisms was specifically plated. 6/29; left buttock is larger and dry this week. Left lateral calf looks to me to be improved. Left dorsal foot also somewhat improved right foot completely unchanged. The erythema on the right foot is still present. He is completing the Ceftin dinner that I gave him empirically [see discussion above.) 7/6 - All wounds look to be stable and perhaps improved, the left buttock wound is slightly smaller, per patient bleeds easily, completed ceftin, the right foot redness is less, he is on terbinafine 7/13; left buttock wound about the same perhaps slightly narrower. Area on the left lateral leg continues to narrow. Left dorsal foot slightly smaller right foot about the same. We are using silver alginate on the right foot and Hydrofera Blue to the areas on the left. Unna boot on the left 2 layer compression on the right 7/20; left buttock wound absolutely the same. Area on lateral leg continues to get better.  Left dorsal foot require debridement as did the right no major change in the 7/27; left buttock wound the same size necrotic debris over the surface. The area on the lateral leg is closed once again. His left foot looks better right foot about the same although there is some involvement now of the posterior first second toe area. He is still on terbinafine which I have given him for a month, not certain a centimeter major change 06/25/19-All wounds appear to be slightly improved according to report, left buttock wound looks clean, both foot wounds have minimal to no debris the right dorsal foot has minimal slough. We are using Hydrofera Blue to the left and silver alginate to the right foot and ischial wound. 8/10-Wounds all appear to be around the same, the right forefoot distal part has some redness which was not there before, however the wound looks clean and small. Ischial wound looks about the same with no changes 8/17; his wound on the left lateral calf which was his original chronic venous insufficiency wound remains closed. Since I last saw him the areas on the left dorsal foot right dorsal foot generally appear better but require  debridement. The area on his left initial tuberosity appears somewhat larger to me perhaps hyper granulated and bleeds very easily. We have been using Hydrofera Blue to the left dorsal foot and silver alginate to everything else 8/24; left lateral calf remains closed. The areas on his dorsal feet on the webspace of the first and second toes bilaterally both look better. The area on the left buttock which is the pressure ulcer stage II slightly smaller. I change the dressing to Hydrofera Blue to all areas 8/31; left lateral calf remains closed. The area on his dorsal feet bilaterally look better. Using Hydrofera Blue. Still requiring debridement on the left foot. No change in the left buttock pressure ulcers however 9/14; left lateral calf remains closed. Dorsal feet  look quite a bit better than 2 weeks ago. Flaking dry skin also a lot better with the ammonium lactate I gave him 2 weeks ago. The area on the left buttock is improved. He states that his Roho cushion developed a leak and he is getting a new one, in the interim he is offloading this vigorously 9/21; left calf remains closed. Left heel which was a possible DTI looks better this week. He had macerated tissue around the left dorsal foot right foot looks satisfactory and improved left buttock wound. I changed his dressings to his feet to silver alginate bilaterally. Continuing Hydrofera Blue on the left buttock. 9/28 left calf remains closed. Left heel did not develop anything [possible DTI] dry flaking skin on the left dorsal foot. Right foot looks satisfactory. Improved left buttock wound. We are using silver alginate on his feet Hydrofera Blue on the buttock. I have asked him to go back to the Lotrisone on his feet including the wounds and surrounding areas 10/5; left calf remains closed. The areas on the left and right feet about the same. A lot of this is epithelialized however debris over the remaining open areas. He is using Lotrisone and silver alginate. The area on the left buttock using Hydrofera Blue 10/26. Patient has been out for 3 weeks secondary to Covid concerns. He tested negative but I think his wife tested positive. He comes in today with the left foot substantially worse, right foot about the same. Even more concerning he states that the area on his left buttock closed over but then reopened and is considerably deeper in one aspect than it was before [stage III wound] 11/2; left foot really about the same as last week. Quarter sized wound on the dorsal foot just proximal to the first second toes. Surrounding erythema with areas of denuded epithelium. This is not really much different looking. Did not look like cellulitis this time however. Right foot area about the same.. We have been  using silver alginate alginate on his toes Left buttock still substantial irritated skin around the wound which I think looks somewhat better. We have been using Hydrofera Blue here. 11/9; left foot larger than last week and a very necrotic surface. Right foot I think is about the same perhaps slightly smaller. Debris around the circumference also addressed. Unfortunately on the left buttock there is been a decline. Satellite lesions below the major wound distally and now a an additional one posteriorly we have been using Hydrofera Blue but I think this is a pressure issue 11/16; left foot ulcer dorsally again a very adherent necrotic surface. Right foot is about the same. Not much change in the pressure ulcer on his left buttock. 11/30; left foot ulcer dorsally basically  the same as when I saw him 2 weeks ago. Very adherent fibrinous debris on the wound surface. Patient reports a lot of drainage as well. 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 We have been using Iodoflex, patient changed back to alginate because of the drainage. Area on his right dorsal foot really looks benign with a healthier surface certainly a lot better than on the left. Left buttock wounds all improved using Hydrofera Blue 12/7; left dorsal foot again no improvement. Tightly adherent debris. PCR culture I did last week only showed likely skin contaminant. I have gone ahead and done a punch biopsy of this which is about the last thing in terms of investigations I can think to do. He has known venous insufficiency and venous hypertension and this could be the issue here. The area on the right foot is about the same left buttock slightly worse according to our intake nurse secondary to [facility] Blue sticking to the wound 12/14; biopsy of the left foot that I did last time showed changes that could be related to wound healing/chronic stasis dermatitis phenomenon no neoplasm. We  have been using silver alginate to both feet. I change the one on the left today to Sorbact and silver alginate to his other 2 wounds 12/28; the patient arrives with the following problems; Major issue is the dorsal left foot which continues to be a larger deeper wound area. Still with a completely nonviable surface Paradoxically the area mirror image on the right on the right dorsal foot appears to be getting better. He had some loss of dry denuded skin from the lower part of his original wound on the left lateral calf. Some of this area looked a little vulnerable and for this reason we put him in wrap that on this side this week The area on his left buttock is larger. He still has the erythematous circular area which I think is a combination of pressure, sweat. This does not look like cellulitis or fungal dermatitis 11/26/2019; -Dorsal left foot large open wound with depth. Still debris over the surface. Using Sorbact The area on the dorsal right foot paradoxically has closed over He has a reopening on the left ankle laterally at the base of his original wound that extended up into the calf. This appears clean. The left buttock wound is smaller but with very adherent necrotic debris over the surface. We have been using silver alginate here as well The patient had arterial studies done in 2017. He had biphasic waveforms at the dorsalis pedis and posterior tibial bilaterally. ABI in the left was 1.17. Digit waveforms were dampened. He has slight spasticity in the great toes I do not think a TBI would be possible 1/11; the patient comes in today with a sizable reopening between the first and second toes on the right. This is not exactly in the same location where we have been treating wounds previously. According to our intake nurse this was actually fairly deep but 0.6 cm. The area on the left dorsal foot looks about the same the surface is somewhat cleaner using Sorbact, his MRI is in 2 days. We have  not managed yet to get arterial studies. The new reopening on the left lateral calf looks somewhat better using alginate. The left buttock wound is about the same using alginate 1/18; the patient had his ARTERIAL studies which were quite normal. ABI in the right at 1.13 with triphasic/biphasic waveforms on the left  ABI 1.06 again with triphasic/biphasic waveforms. It would not have been possible to have done a toe brachial index because of spasticity. We have been using Sorbac to the left foot alginate to the rest of his wounds on the right foot left lateral calf and left buttock 1/25; arrives in clinic with erythema and swelling of the left forefoot worse over the first MTP area. This extends laterally dorsally and but also posteriorly. Still has an area on the left lateral part of the lower part of his calf wound it is eschared and clearly not closed. Area on the left buttock still with surrounding irritation and erythema. Right foot surface wound dorsally. The area between the right and first and second toes appears better. 2/1; The left foot wound is about the same. Erythema slightly better I gave him a week of doxycycline empirically Right foot wound is more extensive extending between the toes to the plantar surface Left lateral calf really no open surface on the inferior part of his original wound however the entire area still looks vulnerable Absolutely no improvement in the left buttock wound required debridement. 2/8; the left foot is about the same. Erythema is slightly improved I gave him clindamycin last week. Right foot looks better he is using Lotrimin and silver alginate He has a breakdown in the left lateral calf. Denuded epithelium which I have removed Left buttock about the same were using Hydrofera Blue 2/15; left foot is about the same there is less surrounding erythema. Surface still has tightly adherent debris which I have debriding however not making any progress Right  foot has a substantial wound on the medial right second toe between the first and second webspace. Still an open area on the left lateral calf distal area. Buttock wound is about the same 2/22; left foot is about the same less surrounding erythema. Surface has adherent debris. Polymen Ag Right foot area significant wound between the first and second toes. We have been using silver alginate here Left lateral leg polymen Ag at the base of his original venous insufficiency wound Left buttock some improvement here 3/1; Right foot is deteriorating in the first second toe webspace. Larger and more substantial. We have been using silver alginate. Left dorsal foot about the same markedly adherent surface debris using PolyMem Ag Left lateral calf surface debris using PolyMem AG Left buttock is improved again using PolyMem Ag. He is completing his terbinafine. The erythema in the foot seems better. He has been on this for 2 weeks 3/8; no improvement in any wound area in fact he has a small open area on the dorsal midfoot which is new this week. He has not gotten his foot x-rays yet 3/15; his x-rays were both negative for osteomyelitis of both feet. No major change in any of his wounds on the extremities however his buttock wounds are better. We have been using polymen on the buttocks, left lower leg. Iodoflex on the left foot and silver alginate on the right 3/22; arrives in clinic today with the 2 major issues are the improvement in the left dorsal foot wound which for once actually looks healthy with a nice healthy wound surface without debridement. Using Iodoflex here. Unfortunately on the left lateral calf which is in the distal part of his original wound he came to the clinic here for there was purulent drainage noted some increased breakdown scattered around the original area and a small area proximally. We we are using polymen here will change to silver  alginate today. His buttock wound on the left  is better and I think the area on the right first second toe webspace is also improved 3/29; left dorsal foot looks better. Using Iodoflex. Left ankle culture from deterioration last time grew E. coli, Enterobacter and Enterococcus. I will give him a course of cefdinir although that will not cover Enterococcus. The area on the right foot in the webspace of the first and second toe lateral first toe looks better. The area on his buttock is about healed Vascular appointment is on April 21. This is to look at his venous system vis--vis continued breakdown of the wounds on the left including the left lateral leg and left dorsal foot he. He has had previous ablations on this side 4/5; the area between the right first and second toes lateral aspect of the first toe looks better. Dorsal aspect of the left first toe on the left foot also improved. Unfortunately the left lateral lower leg is larger and there is a second satellite wound superiorly. The usual superficial abrasions on the left buttock overall better but certainly not closed 4/12; the area between the right first and second toes is improved. Dorsal aspect of the left foot also slightly smaller with a vibrant healthy looking surface. No real change in the left lateral leg and the left buttock wound is healed He has an unaffordable co-pay for Apligraf. Appointment with vein and vascular with regards to the left leg venous part of the circulation is on 4/21 4/19; we continue to see improvement in all wound areas. Although this is minor. He has his vascular appointment on 4/21. The area on the left buttock has not reopened although right in the center of this area the skin looks somewhat threatened 4/26; the left buttock is unfortunately reopened. In general his left dorsal foot has a healthy surface and looks somewhat smaller although it was not measured as such. The area between his first and second toe webspace on the right as a small wound  against the first toe. The patient saw vascular surgery. The real question I was asking was about the small saphenous vein on the left. He has previously ablated left greater saphenous vein. Nothing further was commented on on the left. Right greater saphenous vein without reflux at the saphenofemoral junction or proximal thigh there was no indication for ablation of the right greater saphenous vein duplex was negative for DVT bilaterally. They did not think there was anything from a vascular surgery point of view that could be offered. They ABIs within normal limits 5/3; only small open area on the left buttock. The area on the left lateral leg which was his original venous reflux is now 2 wounds both which look clean. We are using Iodoflex on the left dorsal foot which looks healthy and smaller. He is down to a very tiny area between the right first and second toes, using silver alginate 5/10; all of his wounds appear better. We have much better edema control in 4 layer compression on the left. This may be the factor that is allowing the left foot and left lateral calf to heal. He has external compression garments at home 04/14/20-All of his wounds are progressing well, the left forefoot is practically closed, left ischium appears to be about the same, right toe webspace is also smaller. The left lateral leg is about the same, continue using Hydrofera Blue to this, silver alginate to the ischium, Iodoflex to the toe space on the  right 6/7; most of his wounds outside of the left buttock are doing well. The area on the left lateral calf and left dorsal foot are smaller. The area on the right foot in between the first and second toe webspace is barely visible although he still says there is some drainage here is the only reason I did not heal this out. Unfortunately the area on the left buttock almost looks like he has a skin tear from tape. He has open wound and then a large flap of skin that we are  trying to get adherence over an area just next to the remaining wound 6/21; 2 week follow-up. I believe is been here for nurse visits. Miraculously the area between his first and second toes on the left dorsal foot is closed over. Still open on the right first second web space. The left lateral calf has 2 open areas. Distally this is more superficial. The proximal area had a little more depth and required debridement of adherent necrotic material. His buttock wound is actually larger we have been using silver alginate here 6/28; the patient's area on the left foot remains closed. Still open wet area between the first and second toes on the right and also extending into the plantar aspect. We have been using silver alginate in this location. He has 2 areas on the left lower leg part of his original long wounds which I think are better. We have been using Hydrofera Blue here. Hydrofera Blue to the left buttock which is stable 7/12; left foot remains closed. Left ankle is closed. May be a small area between his right first and second toes the only truly open area is on the left buttock. We have been using Hydrofera Blue here 7/19; patient arrives with marked deterioration especially in the left foot and ankle. We did not put him in a compression wrap on the left last week in fact he wore his juxta lite stockings on either side although he does not have an underlying stocking. He has a reopening on the left dorsal foot, left lateral ankle and a new area on the right dorsal ankle. More worrisome is the degree of erythema on the left foot extending on the lateral foot into the lateral lower leg on the left 7/26; the patient had erythema and drainage from the lateral left ankle last week. Culture of this grew MRSA resistant to doxycycline and clindamycin which are the 2 antibiotics we usually use with this patient who has multiple antibiotic allergies including linezolid, trimethoprim sulfamethoxazole. I had  give him an empiric doxycycline and he comes in the area certainly looks somewhat better although it is blotchy in his lower leg. He has not been systemically unwell. He has had areas on the left dorsal foot which is a reopening, chronic wounds on the left lateral ankle. Both of these I think are secondary to chronic venous insufficiency. The area between his first and second toes is closed as far as I can tell. He had a new wrap injury on the right dorsal ankle last week. Finally he has an area on the left buttock. We have been using silver alginate to everything except the left buttock we are using Hydrofera Blue 06/30/20-Patient returns at 1 week, has been given a sample dose pack of NUZYRA which is a tetracycline derivative [omadacycline], patient has completed those, we have been using silver alginate to almost all the wounds except the left ischium where we are using Hydrofera Blue all of  them look better 8/16; since I last saw the patient he has been doing well. The area on the left buttock, left lateral ankle and left foot are all closed today. He has completed the [place] I gave him last time and tolerated this well. He still has open areas on the right dorsal ankle and in the right first second toe area which we are using silver alginate. 8/23; we put him in his bilateral external compression stockings last week as he did not have anything open on either leg except for concerning area between the right first and second toe. He comes in today with an area on the left dorsal foot slightly more proximal than the original wound, the left lateral foot but this is actually a continuation of the area he had on the left lateral ankle from last time. As well he is opened up on the left buttock again. 8/30; comes in today with things looking a lot better. The area on the left lower ankle has closed down as has the left foot but with eschar in both areas. The area on the dorsal right ankle is also  epithelialized. Very little remaining of the left buttock wound. We have been using silver alginate on all wound areas 9/13; the area in the first second toe webspace on the right has fully epithelialized. He still has some vulnerable epithelium on the right and the ankle and the dorsal foot. He notes weeping. He is using his juxta lite stocking. On the left again the left dorsal foot is closed left lateral ankle is closed. We went to the juxta lite stocking here as well. Still vulnerable in the left buttock although only 2 small open areas remain here 9/27; 2-week follow-up. We did not look at his left leg but the patient says everything is closed. He is a bit disturbed by the amount of edema in his left foot he is using juxta lite stockings but asking about over the toes stockings which would be 30/40, will talk to him next time. According to him there is no open wound on either the left foot or the left ankle/calf He has an open area on the dorsal right calf which I initially point a wrap injury. He has superficial remaining wound on the left ischial tuberosity been using silver alginate although he says this sticks to the wound 10/5; we gave him 2-week follow-up but he called yesterday expressing some concerns about his right foot right ankle and the left buttock. He came in early. There is still no open areas on the left leg and that still in his juxta lite stocking 10/11; he only has 1 small area on the left buttock that remains measuring millimeters 1 mm. Still has the same irritated skin in this area. We recommended zinc oxide when this eventually closes and pressure relief is meticulously is he can do this. He still has an area on the dorsal part of his right first through third toes which is a bit irritated and still open and on the dorsal ankle near the crease of the ankle. We have been using silver alginate and using his own stocking. He has nothing open on the left leg or foot 10/25;  2-week follow-up. Not nearly as good on the left buttock as I was hoping. For open areas with 5 looking threatened small. He has the erythematous irritated chronic skin in this area. 1 area on the right dorsal ankle. He reports this area bleeds easily Right dorsal  foot just proximal to the base of his toes We have been using silver alginate. 11/8; 2-week follow-up. Left buttock is about the same although I do not think the wounds are in the same location we have been using silver alginate. I have asked him to use zinc oxide on the skin around the wounds. He still has a small area on the right dorsal ankle he reports this bleeds easily Right dorsal foot just proximal to the base of the toes does not have anything open although the skin is very dry and scaly He has a new opening on the nailbed of the left great toe. Nothing on the left ankle 11/29; 3-week follow-up. Left buttock has 2 open areas. And washing of these wounds today started bleeding easily. Suggesting very friable tissue. We have been using silver alginate. Right dorsal ankle which I thought was initially a wrap injury we have been using silver alginate. Nothing open between the toes that I can see. He states the area on the left dorsal toe nailbed healed after the last visit in 2 or 3 days 12/13; 3-week follow-up. His left buttock now has 3 open areas but the original 2 areas are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 the left he is still using Santyl. 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 The erythema has not really resolved and I wonder if this is mostly venous inflammation rather than cellulitis. Still using Santyl. He is approved for Apligraf 03/04/16; there is less erythema around the wound. Both wounds require aggressive surgical debridement. Not yet ready for Apligraf 03/11/16; aggressive debridement again. Not ready for Apligraf 03/18/16 aggressive debridement again. 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 Has been to see vascular surgery he is being planned for a venous ablation 03/25/16; aggressive debridement again of both wound areas on the left lateral leg. He is  due for ablation surgery on May 22. He is much closer to being ready for an Apligraf. Has a new area between the left first and second toes 04/01/16 aggressive debridement done of both wounds. The new wound at the base of between his second and first toes looks stable 04/08/16; continued aggressive debridement of both wounds on the left lower leg. He goes for his venous ablation on Monday. The new wound at the base of his first and second toes dorsally appears stable. 04/15/16; wounds aggressively debridement although the base of this looks considerably better Apligraf #1. He had ablation surgery on Monday I'll need to research these records. We only have approval for four Apligraf's 04/22/16; the patient is here for a wound check [Apligraf last week] intake nurse concerned  about erythema around the wounds. Apparently a significant degree of drainage. The patient has chronic venous inflammation which I think accounts for most of this however I was asked to look at this today 04/26/16; the patient came back for check of possible cellulitis in his left foot however the Apligraf dressing was inadvertently removed therefore we elected to prep the wound for a second Apligraf. I put him on doxycycline on 6/1 the erythema in the foot 05/03/16 we did not remove the dressing from the superior wound as this is where I put all of his last Apligraf. Surface debridement done with a curette of the lower wound which looks very healthy. The area on the left foot also looks quite satisfactory at the dorsal artery at the first and second toes 05/10/16; continue Apligraf to this. Her wound, Hydrafera to the lower wound. He has a new area on the right second toe. Left dorsal foot firstoosecond toe also looks improved 05/24/16; wound dimensions must be smaller I was able to use Apligraf to all 3 remaining wound areas. 06/07/16 patient's last Apligraf was 2 weeks ago. He arrives today with the 2 wounds on his lateral left leg joined together. This would have to be seen as a negative. He also has a small wound in his first and second toe on the left dorsally with quite a bit of surrounding erythema in the first second and third toes. This looks to be infected or inflamed, very difficult clinical call. 06/21/16: lateral left leg combined wounds. Adherent surface slough area on the left dorsal foot at roughly the fourth toe looks improved 07/12/16; he now has a single linear wound on the lateral left leg. This does not look to be a lot changed from when I lost saw this. The area on his dorsal left foot looks considerably better however. 08/02/16; no major change in the substantial area on his left lateral leg since last time. We have been using Hydrofera Blue for a prolonged period of time now. The area  on his left foot is also unchanged from last review 07/19/16; the area on his dorsal foot on the left looks considerably smaller. He is beginning to have significant rims of epithelialization on the lateral left leg wound. This also looks better. 08/05/16; the patient came in for a nurse visit today. Apparently the area on his left lateral leg looks better and it was wrapped. However in general discussion the patient noted a new area on the dorsal aspect of his right second toe. The exact etiology of this is unclear but likely relates to pressure. 08/09/16 really the area on the left lateral leg did not really look that healthy today perhaps slightly larger and measurements. The  area on his dorsal right second toe is improved also the left foot wound looks stable to improved 08/16/16; the area on the last lateral leg did not change any of dimensions. Post debridement with a curet the area looked better. Left foot wound improved and the area on the dorsal right second toe is improved 08/23/16; the area on the left lateral leg may be slightly smaller both in terms of length and width. Aggressive debridement with a curette afterwards the tissue appears healthier. Left foot wound appears improved in the area on the dorsal right second toe is improved 08/30/16 patient developed a fever over the weekend and was seen in an urgent care. Felt to have a UTI and put on doxycycline. He has been since changed over the phone to [facility]. After we took off the wrap on his right leg today the leg is swollen warm and erythematous, probably more likely the source of the fever 09/06/16; have been using collagen to the major left leg wound, silver alginate to the area on his anterior foot/toes 09/13/16; the areas on his anterior foot/toes on both sides appear to be virtually closed. Extensive wound on the left lateral leg perhaps slightly narrower but each visit still covered an adherent surface slough 09/16/16 patient was  in for his usual Thursday nurse visit however the intake nurse noted significant erythema of his dorsal right foot. He is also running a low- grade fever and having increasing spasms in the right leg 09/20/16 here for cellulitis involving his right great toes and forefoot. This is a lot better. Still requiring debridement on his left lateral leg. Santyl direct says he needs prior authorization. Therefore his wife cannot change this at home 09/30/16; the patient's extensive area on the left lateral calf and ankle perhaps somewhat better. Using Santyl. The area on the left toes is healed and I think the area on his right dorsal foot is healed as well. There is no cellulitis or venous inflammation involving the right leg. He is going to need compression stockings here. 10/07/16; the patient's extensive wound on the left lateral calf and ankle does not measure any differently however there appears to be less adherent surface slough using Santyl and aggressive weekly debridements 10/21/16; no major change in the area on the left lateral calf. Still the same measurement still very difficult to debridement adherent slough and nonviable subcutaneous tissue. This is not really been helped by several weeks of Santyl. Previously for 2 weeks I used Iodoflex for a short period. A prolonged course of Hydrofera Blue didn't really help. I'm not sure why I only used 2 weeks of Iodoflex on this there is no evidence of surrounding infection. He has a small area on the right second toe which looks as though it's progressing towards closure 10/28/16; the wounds on his toes appear to be closed. No major change in the left lateral leg wound although the surface looks somewhat better using Iodoflex. He has had previous arterial studies that were normal. He has had reflux studies and is status post ablation although I don't have any exact notes on which vein was ablated. I'll need to check the surgical record 11/04/16; he's  had a reopening between the first and second toe on the left and right. No major change in the left lateral leg wound. There is what appears to be cellulitis of the left dorsal foot 11/18/16 the patient was hospitalized initially in [place] and then subsequently transferred to [facility] long and was admitted  there from 11/09/16 through 11/12/16. He had developed progressive cellulitis on the right leg in spite of the doxycycline I gave him. I'd spoken to the hospitalist in [place] who was concerned about continuing leukocytosis. CT scan is what I suggested this was done which showed soft tissue swelling without evidence of osteomyelitis or an underlying abscess blood cultures were negative. At [facility] he was treated with vancomycin and Primaxin and then add an infectious disease consult. He was transitioned to Ceftaroline. He has been making progressive improvement. Overall a severe cellulitis of the right leg. He is been using silver alginate to her original wound on the left leg. The wounds in his toes on the right are closed there is a small open area on the base of the left second toe 11/26/15; the patient's right leg is much better although there is still some edema here this could be reminiscent from his severe cellulitis likely on top of some degree of lymphedema. His left anterior leg wound has less surface slough as reported by her intake nurse. Small wound at the base of the left second toe 12/02/16; patient's right leg is better and there is no open wound here. His left anterior lateral leg wound continues to have a healthy-looking surface. Small wound at the base of the left second toe however there is erythema in the left forefoot which is worrisome 12/16/16; is no open wounds on his right leg. We took measurements for stockings. His left anterior lateral leg wound continues to have a healthy-looking surface. I'm not sure where we were with the Apligraf run through his insurance. We have been  using Iodoflex. He has a thick eschar on the left first second toe interface, I suspect this may be fungal however there is no visible open 12/23/16; no open wound on his right leg. He has 2 small areas left of the linear wound that was remaining last week. We have been using Prisma, I thought I have disclosed this week, we can only look forward to next week 01/03/17; the patient had concerning areas of erythema last week, already on doxycycline for UTI through his primary doctor. The erythema is absolutely no better there is warmth and swelling both medially from the left lateral leg wound and also the dorsal left foot. 01/06/17- Patient is here for follow-up evaluation of his left lateral leg ulcer and bilateral feet ulcers. He is on oral antibiotic therapy, tolerating that. Nursing staff and the patient states that the erythema is improved from Monday. 01/13/17; the predominant left lateral leg wound continues to be problematic. I had put Apligraf on him earlier this month once. However he subsequently developed what appeared to be an intense cellulitis around the left lateral leg wound. I gave him Dalvance I think on 2/12 perhaps 2/13 he continues on cefdinir. The erythema is still present but the warmth and swelling is improved. I am hopeful that the cellulitis part of this control. I wouldn't be surprised if there is an element of venous inflammation as well. 01/17/17. The erythema is present but better in the left leg. His left lateral leg wound still does not have a viable surface buttons certain parts of this long thin wound it appears like there has been improvement in dimensions. 01/20/17; the erythema still present but much better in the left leg. I'm thinking this is his usual degree of chronic venous inflammation. The wound on the left leg looks somewhat better. Is less surface slough 01/27/17; erythema is back to  the chronic venous inflammation. The wound on the left leg is somewhat better. I am  back to the point where I like to try an Apligraf once again 02/10/17; slight improvement in wound dimensions. Apligraf #2. He is completing his doxycycline 02/14/17; patient arrives today having completed doxycycline last Thursday. This was supposed to be a nurse visit however once again he hasn't tense erythema from the medial part of his wound extending over the lower leg. Also erythema in his foot this is roughly in the same distribution as last time. He has baseline chronic venous inflammation however this is a lot worse than the baseline I have learned to accept the on him is baseline inflammation 02/24/17- patient is here for follow-up evaluation. He is tolerating compression therapy. His voicing no complaints or concerns he is here anticipating an Apligraf 03/03/17; he arrives today with an adherent necrotic surface. I don't think this is surface is going to be amenable for Apligraf's. The erythema around his wound and on the left dorsal foot has resolved he is off antibiotics 03/10/17; better-looking surface today. I don't think he can tolerate Apligraf's. He tells me he had a wound VAC after a skin graft years ago to this area and they had difficulty with a seal. The erythema continues to be stable around this some degree of chronic venous inflammation but he also has recurrent cellulitis. We have been using Iodoflex 03/17/17; continued improvement in the surface and may be small changes in dimensions. Using Iodoflex which seems the only thing that will control his surface 03/24/17- He is here for follow up evaluation of his LLE lateral ulceration and ulcer to right dorsal foot/toe space. He is voicing no complaints or concerns, He is tolerating compression wrap. 03/31/17 arrives today with a much healthier looking wound on the left lower extremity. We have been using Iodoflex for a prolonged period of time which has for the first time prepared and adequate looking wound bed although we have not had  much in the way of wound dimension improvement. He also has a small wound between the first and second toe on the right 04/07/17; arrives today with a healthy-looking wound bed and at least the top 50% of this wound appears to be now her. No debridement was required I have changed him to [facility] last week after prolonged Iodoflex. He did not do well with Apligraf's. We've had a re-opening between the first and second toe on the right 04/14/17; arrives today with a healthier looking wound bed contractions and the top 50% of this wound and some on the lesser 50%. Wound bed appears healthy. The area between the first and second toe on the right still remains problematic 04/21/17; continued very gradual improvement. Using [facility] 04/28/17; continued very gradual improvement in the left lateral leg venous insufficiency wound. His periwound erythema is very mild. We have been using Hydrofera Blue. Wound is making progress especially in the superior 50% 05/05/17; he continues to have very gradual improvement in the left lateral venous insufficiency wound. Both in terms with an length rings are improving. I debrided this every 2 weeks with #5 curet and we have been using Hydrofera Blue and again making good progress With regards to the wounds between his right first and second toe which I thought might of been tinea pedis he is not making as much progress very dry scaly skin over the area. Also the area at the base of the left first and second toe in a  similar condition 05/12/17; continued gradual improvement in the refractory left lateral venous insufficiency wound on the left. Dimension smaller. Surface still requiring debridement using Hydrofera Blue 05/19/17; continued gradual improvement in the refractory left lateral venous ulceration. Careful inspection of the wound bed underlying rumination suggested some degree of epithelialization over the surface no debridement indicated. Continue Hydrofera  Blue difficult areas between his toes first and third on the left than first and second on the right. I'm going to change to silver alginate from silver collagen. Continue ketoconazole as I suspect underlying tinea pedis 05/26/17; left lateral leg venous insufficiency wound. We've been using Hydrofera Blue. I believe that there is expanding epithelialization over the surface of the wound albeit not coming from the wound circumference. This is a bit of an odd situation in which the epithelialization seems to be coming from the surface of the wound rather than in the exact circumference. There is still small open areas mostly along the lateral margin of the wound. ooHe has unchanged areas between the left first and second and the right first second toes which I been treating for tenia pedis 06/02/17; left lateral leg venous insufficiency wound. We have been using Hydrofera Blue. Somewhat smaller from the wound circumference. The surface of the wound remains a bit on it almost epithelialized sedation in appearance. I use an open curette today debridement in the surface of all of this especially the edges ooSmall open wounds remaining on the dorsal right first and second toe interspace and the plantar left first second toe and her face on the left 06/09/17; wound on the left lateral leg continues to be smaller but very gradual and very dry surface using Hydrofera Blue 06/16/17 requires weekly debridements now on the left lateral leg although this continues to contract. I changed to silver collagen last week because of dryness of the wound bed. Using Iodoflex to the areas on his first and second toes/web space bilaterally 06/24/17; patient with history of paraplegia also chronic venous insufficiency with lymphedema. Has a very difficult wound on the left lateral leg. This has been gradually reducing in terms of with but comes in with a very dry adherent surface. High switch to silver collagen a week or so ago  with hydrogel to keep the area moist. This is been refractory to multiple dressing attempts. He also has areas in his first and second toes bilaterally in the anterior and posterior web space. I had been using Iodoflex here after a prolonged course of silver alginate with ketoconazole was ineffective [question tinea pedis] 07/14/17; patient arrives today with a very difficult adherent material over his left lateral lower leg wound. He also has surrounding erythema and poorly controlled edema. He was switched his Santyl last visit which the nurses are applying once during his doctor visit and once on a nurse visit. He was also reduced to 2 layer compression I'm not exactly sure of the issue here. 07/21/17; better surface today after 1 week of Iodoflex. Significant cellulitis that we treated last week also better. [Doxycycline] 07/28/17 better surface today with now 2 weeks of Iodoflex. Significant cellulitis treated with doxycycline. He has now completed the doxycycline and he is back to his usual degree of chronic venous inflammation/stasis dermatitis. He reminds me he has had ablations surgery here 08/04/17; continued improvement with Iodoflex to the left lateral leg wound in terms of the surface of the wound although the dimensions are better. He is not currently on any antibiotics, he has the usual degree  of chronic venous inflammation/stasis dermatitis. Problematic areas on the plantar aspect of the first second toe web space on the left and the dorsal aspect of the first second toe web space on the right. At one point I felt these were probably related to chronic fungal infections in treated him aggressively for this although we have not made any improvement here. 08/11/17; left lateral leg. Surface continues to improve with the Iodoflex although we are not seeing much improvement in overall wound dimensions. Areas on his plantar left foot and right foot show no improvement. In fact the right foot looks  somewhat worse 08/18/17; left lateral leg. We changed to [facility] Blue last week after a prolonged course of Iodoflex which helps get the surface better. It appears that the wound with is improved. Continue with difficult areas on the left dorsal first second and plantar first second on the right 09/01/17; patient arrives in clinic today having had a temperature of 103 yesterday. He was seen in the ER and [facility]. The patient was concerned he could have cellulitis again in the right leg however they diagnosed him with a UTI and he is now on Keflex. He has a history of cellulitis which is been recurrent and difficult but this is been in the left leg, in the past 5 use doxycycline. He does in and out catheterizations at home which are risk factors for UTI 09/08/17; patient will be completing his Keflex this weekend. The erythema on the left leg is considerably better. He has a new wound today on the medial part of the right leg small superficial almost looks like a skin tear. He has worsening of the area on the right dorsal first and second toe. His major area on the left lateral leg is better. Using Hydrofera Blue on all areas 09/15/17; gradual reduction in width on the long wound in the left lateral leg. No debridement required. He also has wounds on the plantar aspect of his left first second toe web space and on the dorsal aspect of the right first second toe web space. 09/22/17; there continues to be very gradual improvements in the dimensions of the left lateral leg wound. He hasn't round erythematous spot with might be pressure on his wheelchair. There is no evidence obviously of infection no purulence no warmth ooHe has a dry scaled area on the plantar aspect of the left first second toe ooImproved area on the dorsal right first second toe. 09/29/17; left lateral leg wound continues to improve in dimensions mostly with an is still a fairly long but increasingly narrow wound. ooHe has a dry  scaled area on the plantar aspect of his left first second toe web space ooIncreasingly concerning area on the dorsal right first second toe. In fact I am concerned today about possible cellulitis around this wound. The areas extending up his second toe and although there is deformities here almost appears to abut on the nailbed. 10/06/17; left lateral leg wound continues to make very gradual progress. Tissue culture I did from the right first second toe dorsal foot last time grew MRSA and enterococcus which was vancomycin sensitive. This was not sensitive to clindamycin or doxycycline. He is allergic to Zyvox and sulfa we have therefore arrange for him to have dalvance infusion tomorrow. He is had this in the past and tolerated it well 10/20/17; left lateral leg wound continues to make decent progress. This is certainly reduced in terms of with there is advancing epithelialization.ooThe cellulitis in the right  foot looks better although he still has a deep wound in the dorsal aspect of the first second toe web space. Plantar left first toe web space on the left I think is making some progress 10/27/17; left lateral leg wound continues to make decent progress. Advancing epithelialization.using Hydrofera Blue ooThe right first second toe web space wound is better-looking using silver alginate ooImprovement in the left plantar first second toe web space. Again using silver alginate 11/03/17 left lateral leg wound continues to make decent progress albeit slowly. Using Hydrofera Blue ooThe right per second toe web space continues to be a very problematic looking punched out wound. I obtained a piece of tissue for deep culture I did extensively treated this for fungus. It is difficult to imagine that this is a pressure area as the patient states other than going outside he doesn't really wear shoes at home ooThe left plantar first second toe web space looked fairly senescent. Necrotic edges. This  required debridement oochange to Hydrofera Blue to all wound areas 11/10/17; left lateral leg wound continues to contract. Using Hydrofera Blue ooOn the right dorsal first second toe web space dorsally. Culture I did of this area last week grew MRSA there is not an easy oral option in this patient was multiple antibiotic allergies or intolerances. This was only a rare culture isolate I'm therefore going to use Bactroban under silver alginate ooOn the left plantar first second toe web space. Debridement is required here. This is also unchanged 11/17/17; left lateral leg wound continues to contract using Hydrofera Blue this is no longer the major issue. ooThe major concern here is the right first second toe web space. He now has an open area going from dorsally to the plantar aspect. There is now wound on the inner lateral part of the first toe. Not a very viable surface on this. There is erythema spreading medially into the forefoot. ooNo major change in the left first second toe plantar wound 11/24/17; left lateral leg wound continues to contract using Hydrofera Blue. Nice improvement today ooThe right first second toe web space all of this looks a lot less angry than last week. I have given him clindamycin and topical Bactroban for MRSA and terbinafine for the possibility of underlining tinea pedis that I could not control with ketoconazole. Looks somewhat better ooThe area on the plantar left first second toe web space is weeping with dried debris around the wound 12/01/17; left lateral leg wound continues to contract he Hydrofera Blue. It is becoming thinner in terms of with nevertheless it is making good improvement. ooThe right first second toe web space looks less angry but still a large necrotic-looking wounds starting on the plantar aspect of the right foot extending between the toes and now extensively on the base of the right second toe. I gave him clindamycin and topical Bactroban for  MRSA anterior benefiting for the possibility of underlying tinea pedis. Not looking better today ooThe area on the left first/second toe looks better. Debrided of necrotic debris 12/05/17* the patient was worked in urgently today because over the weekend he found blood on his incontinence bad when he woke up. He was found to have an ulcer by his wife who does most of his wound care. He came in today for [place] to look at this. He has not had a history of wounds in his buttocks in spite of his paraplegia. 12/08/17; seen in follow-up today at his usual appointment. He was seen earlier this week  and found to have a new wound on his buttock. We also follow him for wounds on the left lateral leg, left first second toe web space and right first second toe web space 12/15/17; we have been using Hydrofera Blue to the left lateral leg which has improved. The right first second toe web space has also improved. Left first second toe web space plantar aspect looks stable. The left buttock has worsened using Santyl. Apparently the buttock has drainage 12/22/17; we have been using Hydrofera Blue to the left lateral leg which continues to improve now 2 small wounds separated by normal skin. He tells [place] he had a fever up to 100 yesterday he is prone to UTIs but has not noted anything different. He does in and out catheterizations. The area between the first and second toes today does not look good necrotic surface covered with what looks to be purulent drainage and erythema extending into the third toe. I had gotten this to something that I thought look better last time however it is not look good today. He also has a necrotic surface over the buttock wound which is expanded. I thought there might be infection under here so I removed a lot of the surface with a #5 curet though nothing look like it really needed culturing. He is been using Santyl to this area 12/27/17; his original wound on the left lateral leg continues to  improve using Hydrofera Blue. I gave him samples of Baxdella although he was unable to take them out of fear for an allergic reaction ["lump in his throat"].the culture I did of the purulent drainage from his second toe last week showed both enterococcus and a set Enterobacter I was also concerned about the erythema on the bottom of his foot although paradoxically although this looks somewhat better today. 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 We've been using silver alginate here. They came up today that he sleeps in a recliner, I'm not sure why but I asked him to stop this 01/03/18; his original wound we've been using Hydrofera Blue is now separated into 2 areas. ooUlcer on his left buttock is better he is off the recliner and sleeping in bed ooFinally both wound areas between his first and second toes also looks some better 01/10/18; his original wound on the left lateral leg is now separated into 2 wounds we've been using Hydrofera Blue ooUlcer on his left buttock has some drainage. There is a small probing site going into muscle layer superiorly.using silver alginate -He arrives today with a deep tissue injury on the left heel ooThe wound on the dorsal aspect of his first second toe on the left looks a lot betterusing silver alginate ketoconazole ooThe area on the first second toe web space on the right also looks a lot bette 01/17/18; his original wound on the left lateral leg continues to progress using Hydrofera Blue ooUlcer on his left buttock also is smaller surface healthier except for a small probing site going into the muscle layer superiorly. 2.4 cm of tunneling in this area ooDTI on his left heel we have only been offloading. Looks better than last week no threatened open no evidence of infection oothe wound on the dorsal aspect of the first second toe on the left continues to look  like it's regressing we have only been using silver alginate and terbinafine orally ooThe area in the first  second toe web space on the right also looks to be a lot better using silver alginate and terbinafine I think this was prompted by tinea pedis 01/31/18; the patient was hospitalized in [facility] last week apparently for a complicated UTI. He was discharged on cefepime he does in and out catheterizations. In the hospital he was discovered M I don't mild elevation of AST and ALT and the terbinafine was stopped.predictably the pressure ulcer on s his buttock looks betterusing silver alginate. The area on the left lateral leg also is better using Hydrofera Blue. The area between the first and second toes on the left better. First and second toes on the right still substantial but better. Finally the DTI on the left heel has held together and looks like it's resolving 02/07/18-he is here in follow-up evaluation for multiple ulcerations. He has new injury to the lateral aspect of the last issue a pressure ulcer, he states this is from adhesive removal trauma. He states he has tried multiple adhesive products with no success. All other ulcers appear stable. The left heel DTI is resolving. We will continue with same treatment plan and follow-up next week. 02/14/18; follow-up for multiple areas. ooHe has a new area last week on the lateral aspect of his pressure ulcer more over the posterior trochanter. The original pressure ulcer looks quite stable has healthy granulation. 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 Almost fully epithelialized on the original superior area using Hydrofera Blue ooDTI on the left heel has peeled off this week to reveal a small superficial wound under denuded skin and subcutaneous tissue ooBoth areas between the first and second toes look better including nothing  open on the left 02/21/18; ooThe patient's wounds on his left ischial tuberosity and posterior left greater trochanter actually looked better. He has a large area of irritation around the area which I think is contact dermatitis. I am doubtful that this is fungal ooHis original wound on the left lateral calf continues to improve we have been using Hydrofera Blue ooThere is no open area in the left first second toe web space although there is a lot of thick callus ooThe DTI on the left heel required debridement today of necrotic surface eschar and subcutaneous tissue using silver alginate ooFinally the area on the right first second toe webspace continues to contract using silver alginate and ketoconazole 02/28/18 ooLeft ischial tuberosity wounds look better using silver alginate. ooOriginal wound on the left calf only has one small open area left using Hydrofera Blue ooDTI on the left heel required debridement mostly removing skin from around this wound surface. Using silver alginate ooThe areas on the right first/second toe web space using silver alginate and ketoconazole 03/08/18 on evaluation today patient appears to be doing decently well as best I can tell in regard to his wounds. This is the first time that I have seen him as he generally is followed by Dr. Dellia Nims. With that being said none of his wounds appear to be infected he does have an area where there is some skin covering what appears to be a new wound on the left dorsal surface of his great toe. This is right at the nail bed. With that being said I do believe that debrided away some of the excess skin can be of benefit in this regard. Otherwise he has been tolerating the dressing changes without complication. 03/14/18; patient arrives  today with the multiplicity of wounds that we are following. He has not been systemically unwell ooOriginal wound on the left lateral calf now only has 2 small open areas we've been using Hydrofera  Blue which should continue ooThe deep tissue injury on the left heel requires debridement today. We've been using silver alginate ooThe left first second toe and the right first second toe are both are reminiscence what I think was tinea pedis. Apparently some of the callus Surface between the toes was removed last week when it started draining. ooPurulent drainage coming from the wound on the ischial tuberosity on the left. 03/21/18-He is here in follow-up evaluation for multiple wounds. 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 He tolerated debridement. The only change to last week's recommendations is to discontinue antifungal cream between toes. He will follow-up next week 03/28/18; following up for multiple wounds;Concern this week is streaking redness and swelling in the right foot. He is going to need antibiotics for this. 03/31/18; follow-up for right foot cellulitis. Streaking redness and swelling in the right foot on 03/28/18. 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 I put him on clindamycin 300 mg every 6 and brought him in for a quick check. He has an open wound between his first and second toes on the right foot as a potential source. 04/04/18; ooRight foot cellulitis is resolving he is completing clindamycin. This is truly good news ooLeft lateral calf wound which is initial wound only has one small open area inferiorly this is close to healing out. He has compression stockings. We will use Hydrofera Blue right down to the epithelialization of this ooNonviable surface on the left heel which was initially pressure with a DTI. We've been using Hydrofera Blue. I'm going to switch this back to silver alginate ooLeft first second toe/tinea pedis this looks better using silver alginate ooRight first second toe tinea pedis using silver alginate ooLarge pressure ulcers on theLeft ischial  tuberosity. Small wound here Looks better. I am uncertain about the surface over the large wound. Using silver alginate 04/11/18; ooCellulitis in the right foot is resolved ooLeft lateral calf wound which was his original wounds still has 2 tiny open areas remaining this is just about closed ooNonviable surface on the left heel is better but still requires debridement ooLeft first second toe/tinea pedis still open using silver alginate ooRight first second toe wound tinea pedis I asked him to go back to using ketoconazole and silver alginate ooLarge pressure ulcers on the left ischial tuberosity this shear injury here is resolved. Wound is smaller. No evidence of infection using silver alginate 04/18/18; ooPatient arrives with an intense area of cellulitis in the right mid lower calf extending into the right heel area. Bright red and warm. Smaller area on the left anterior leg. He has a significant history of MRSA. He will definitely need antibioticsoodoxycycline ooHe now has 2 open areas on the left ischial tuberosity the original large wound and now a satellite area which I think was above his initial satellite areas. Not a wonderful surface on this satellite area surrounding erythema which looks like pressure related. ooHis left lateral calf wound again his original wound is just about closed ooLeft heel pressure injury still requiring debridement ooLeft first second toe looks a lot better using silver alginate ooRight first second toe also using silver alginate and ketoconazole cream also looks better 04/20/18; the patient was worked in early today  out of concerns with his cellulitis on the right leg. I had started him on doxycycline. This was 2 days ago. His wife was concerned about the swelling in the area. Also concerned about the left buttock. He has not been systemically unwell no fever chills. No nausea vomiting or diarrhea 04/25/18; the patient's left buttock wound is  continued to deteriorate he is using Hydrofera Blue. He is still completing clindamycin for the cellulitis on the right leg although all of this looks better. 05/02/18 ooLeft buttock wound still with a lot of drainage and a very tightly adherent fibrinous necrotic surface. He has a deeper area superiorly ooThe left lateral calf wound is still closed ooDTI wound on the left heel necrotic surface especially the circumference using Iodoflex ooAreas between his left first second toe and right first second toe both look better. Dorsally and the right first second toe he had a necrotic surface although at smaller. In using silver alginate and ketoconazole. I did a culture last week which was a deep tissue culture of the reminiscence of the open wound on the right first second toe dorsally. This grew a few Acinetobacter and a few methicillin-resistant staph aureus. Nevertheless the area actually this week looked better. I didn't feel the need to specifically address this at least in terms of systemic antibiotics. 05/09/18; wounds are measuring larger more drainage per our intake. We are using Santyl covered with alginate on the large superficial buttock wounds, Iodosorb on the left heel, ketoconazole and silver alginate to the dorsal first and second toes bilaterally. 05/16/18; ooThe area on his left buttock better in some aspects although the area superiorly over the ischial tuberosity required an extensive debridement.using Santyl ooLeft heel appears stable. Using Iodoflex ooThe areas between his first and second toes are not bad however there is spreading erythema up the dorsal aspect of his left foot this looks like cellulitis again. He is insensate the erythema is really very brilliant.o Erysipelas He went to see an allergist days ago because he was itching part of this he had lab work done. This showed a white count of 15.1 with 70% neutrophils. Hemoglobin of 11.4 and a platelet count of  659,000. Last white count we had in Epic was a 2-1/2 years ago which was 25.9 but he was ill at the time. He was able to show me some lab work that was done by his primary physician the pattern is about the same. 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 But prompted me to go ahead and do x-rays of both feet and the pelvis rule out osteomyelitis. He also had a comprehensive metabolic panel this was reasonably normal his albumin was 3.7 liver function tests BUN/creatinine all normal 05/23/18; x-rays of both his feet from last week were negative for underlying pulmonary abnormality. The x-ray of his pelvis however showed mild irregularity in the left ischial which may represent some early osteomyelitis. The wound in the left ischial continues to get deeper clearly now exposed muscle. Each week necrotic surface material over this area. Whereas the rest of the wounds do not look so bad. ooThe left ischial wound we have been using Santyl and calcium alginate ooT the left heel surface necrotic debris using Iodoflex o ooThe left lateral leg is still healed ooAreas on the left dorsal foot and the right dorsal foot are about the same. There is some inflammation on the left which might represent contact dermatitis, fungal dermatitis  I am doubtful cellulitis although this looks better than last week 05/30/18; CT scan done at Hospital did not show any osteomyelitis or abscess. Suggested the possibility of underlying cellulitis although I don't see a lot of evidence of this at the bedside ooThe wound itself on the left buttock/upper thigh actually looks somewhat better. No debridement ooLeft heel also looks better no debridement continue Iodoflex ooBoth dorsal first second toe spaces appear better using Lotrisone. Left still required debridement 06/06/18; ooIntake reported some purulent looking drainage from the left gluteal wound. Using Santyl and calcium  alginate ooLeft heel looks better although still a nonviable surface requiring debridement ooThe left dorsal foot first/second webspace actually expanding and somewhat deeper. I may consider doing a shave biopsy of this area ooRight dorsal foot first/second webspace appears stable to improved. Using Lotrisone and silver alginate to both these areas 06/13/18 ooLeft gluteal surface looks better. Now separated in the 2 wounds. No debridement required. Still drainage. We'll continue silver alginate ooLeft heel continues to look better with Iodoflex continue this for at least another week ooOf his dorsal foot wounds the area on the left still has some depth although it looks better than last week. We've been using Lotrisone and silver alginate 06/20/18 ooLeft gluteal continues to look better healthy tissue ooLeft heel continues to look better healthy granulation wound is smaller. He is using Iodoflex and his long as this continues continue the Iodoflex ooDorsal right foot looks better unfortunately dorsal left foot does not. There is swelling and erythema of his forefoot. He had minor trauma to this several days ago but doesn't think this was enough to have caused any tissue injury. Foot looks like cellulitis, we have had this problem before 06/27/18 on evaluation today patient appears to be doing a little worse in regard to his foot ulcer. 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 Both of which would really be his only options for treating this infection. In the past he has been given and effusion of Orbactiv. This is done very well for him in the past again it's one time dosing IV antibiotic therapy. Subsequently I do believe this is something we're gonna need to see about doing at this point in time. Currently his other wounds seem to be doing somewhat  better in my pinion I'm pretty happy in that regard. 07/03/18 on evaluation today patient's wounds actually appear to be doing fairly well. He has been tolerating the dressing changes without complication. All in all he seems to be showing signs of improvement. In regard to the antibiotics he has been dealing with infectious disease since I saw him last week as far as getting this scheduled. In the end [facility]'s [facility] this coming Friday. In the meantime he has been continuing to perform the dressing changes in such as previous. There does not appear to be any evidence of infection worsengin at this time. 07/10/18; ooSince I last saw this man 2 weeks ago things have actually improved. IV antibiotics of resulted in less forefoot erythema although there is still some present. He is not systemically unwell ooLeft buttock wounds o2 now have no depth there is increased epithelialization Using silver alginate ooLeft heel still requires debridement using Iodoflex ooLeft dorsal foot still with a sizable wound about the size of  a border but healthy granulation ooRight dorsal foot still with a slitlike area using silver alginate 07/18/18; the patient's cellulitis in the left foot is improved in fact I think it is on its way to resolving. ooLeft buttock wounds o2 both look better although the larger one has hypertension granulation we've been using silver alginate ooLeft heel has some thick circumferential redundant skin over the wound edge which will need to be removed today we've been using Iodoflex ooLeft dorsal foot is still a sizable wound required debridement using silver alginate ooThe right dorsal foot is just about closed only a small open area remains here 07/25/18; left foot cellulitis is resolved ooLeft buttock wounds o2 both look better. Hyper-granulation on the major area ooLeft heel as some debris over the surface but otherwise  looks a healthier wound. Using silver collagen ooRight dorsal foot is just about closed 07/31/18; arrives with our intake nurse worried about purulent drainage from the buttock. We had hyper-granulation here last week ooHis buttock wounds o2 continue to look better ooLeft heel some debris over the surface but measuring smaller. ooRight dorsal foot unfortunately has openings between the toes ooLeft foot superficial wound looks less aggravated. 08/07/18 ooButtock wounds continue to look better although some of her granulation and the larger medial wound. silver alginate ooLeft heel continues to look a lot better.silver collagen ooLeft foot superficial wound looks less stable. Requires debridement. He has a new wound superficial area on the foot on the lateral dorsal foot. ooRight foot looks better using silver alginate without Lotrisone 08/14/2018; patient was in the ER last week diagnosed with a UTI. He is now on Cefpodoxime and Macrodantin. ooButtock wounds continued to be smaller. Using silver alginate ooLeft heel continues to look better using silver collagen ooLeft foot superficial wound looks as though it is improving ooRight dorsal foot area is just about healed. 08/21/2018; patient is completed his antibiotics for his UTI. ooHe has 2 open areas on the buttocks. There is still not closed although the surface looks satisfactory. Using silver alginate ooLeft heel continues to improve using silver collagen ooThe bilateral dorsal foot areas which are at the base of his first and second toes/possible tinea pedis are actually stable on the left but worse on the right. The area on the left required debridement of necrotic surface. After debridement I obtained a specimen for PCR culture. ooThe right dorsal foot which is been just about healed last week is now reopened 08/28/2018; culture done on the left dorsal foot showed coag negative staph both staph epidermidis and Lugdunensis. I  think this is worthwhile initiating systemic treatment. I will use doxycycline given his long list of allergies. The area on the left heel slightly improved but still requiring debridement. ooThe large wound on the buttock is just about closed whereas the smaller one is larger. Using silver alginate in this area 09/04/2018; patient is completing his doxycycline for the left foot although this continues to be a very difficult wound area with very adherent necrotic debris. We are using silver alginate to all his wounds right foot left foot and the small wounds on his buttock, silver collagen on the left heel. 09/11/2018; once again this patient has intense erythema and swelling of the left forefoot. Lesser degrees of erythema in the right foot. He has a long list of allergies and intolerances. I will reinstitute doxycycline. 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 Using silver alginate ooLeft heel also  looks better using silver collagen ooUnfortunately both the areas on his feet look worse. The area on the left first second webspace is now gone through to the plantar part of his foot. The area on the left foot anteriorly is irritated with erythema and swelling in the forefoot. 09/25/2018 ooHis wound on the left plantar heel looks better. Using silver collagen ooThe area on the left buttock 2 small remnant areas. One is closed one is still open. Using silver alginate ooThe areas between both his first and second toes look worse. This in spite of long-standing antifungal therapy with ketoconazole and silver alginate which should have antifungal activity ooHe has small areas around his original wound on the left calf one is on the bottom of the original scar tissue and one superiorly both of these are small and superficial but again given wound history in this site this is worrisome 10/02/2018 ooLeft plantar heel continues to gradually  contract using silver collagen ooLeft buttock wound is unchanged using silver alginate ooThe areas on his dorsal feet between his first and second toes bilaterally look about the same. 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 Small open area here. We have been using silver alginate 10/09/2018; ooLeft plantar heel continues to look healthy and contract. No debridement is required ooLeft buttock slightly smaller with a tape injury wound just below which was new this week ooDorsal feet somewhat improved I have been using clindamycin ooLeft lateral looks lower extremity the actual open area looks worse although a lot of this is epithelialized. I am going to change to silver collagen today He has a lot more swelling in the right leg although this is not pitting not red and not particularly warm there is a lot of spasm in the right leg usually indicative of people with paralysis of some underlying discomfort. We have reviewed his vascular status from 2017 he had a left greater saphenous vein ablation. I wonder about referring him back to vascular surgery if the area on the left leg continues to deteriorate. 10/16/2018 in today for follow-up and management of multiple lower extremity ulcers. His left Buttock wound is much lower smaller and almost closed completely. The wound to the left ankle has began to reopen with Epithelialization and some adherent slough. He has multiple new areas to the left foot and leg. The left dorsal foot without much improvement. Wound present between left great webspace and 2nd toe. Erythema and edema present right leg. Right LE ultrasound obtained on 10/10/18 was negative for DVT . 10/23/2018; ooLeft buttock is closed over. Still dry macerated skin but there is no open wound. I suspect  this is chronic pressure/moisture ooLeft lateral calf is quite a bit worse than when I saw this last. There is clearly drainage here he has macerated skin into the left plantar heel. We will change the primary dressing to alginate ooLeft dorsal foot has some improvement in overall wound area. Still using clindamycin and silver alginate ooRight dorsal foot about the same as the left using clindamycin and silver alginate ooThe erythema in the right leg has resolved. He is DVT rule out was negative ooLeft heel pressure area required debridement although the wound is smaller and the surface is health 10/26/2018 ooThe patient came back in for his nurse check today predominantly because of the drainage coming out of  the left lateral leg with a recent reopening of his original wound on the left lateral calf. He comes in today with a large amount of surrounding erythema around the wound extending from the calf into the ankle and even in the area on the dorsal foot. He is not systemically unwell. He is not febrile. Nevertheless this looks like cellulitis. We have been using silver alginate to the area. I changed him to a regular visit and I am going to prescribe him doxycycline. The rationale here is a long list of medication intolerances and a history of MRSA. I did not see anything that I thought would provide a valuable culture 10/30/2018 ooFollow-up from his appointment 4 days ago with really an extensive area of cellulitis in the left calf left lateral ankle and left dorsal foot. I put him on doxycycline. He has a long list of medication allergies which are true allergy reactions. 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 In any case he is a lot better today. The erythema especially in the anterior and lateral left calf is better. He still has left ankle erythema. He also is complaining about increasing edema in the right leg we  have only been using Kerlix Coban and he has been doing the wraps at home. Finally he has a spotty rash on the medial part of his upper left calf which looks like folliculitis or perhaps wrap occlusion type injury. Small superficial macules not pustules 11/06/18 patient arrives today with again a considerable degree of erythema around the wound on the left lateral calf extending into the dorsal ankle and dorsal foot. This is a lot worse than when I saw this last week. He is on doxycycline really with not a lot of improvement. He has not been systemically unwell Wounds on the; left heel actually looks improved. Original area on the left foot and proximity to the first and second toes looks about the same. He has superficial areas on the dorsal foot, anterior calf and then the reopening of his original wound on the left lateral calf which looks about the same ooThe only area he has on the right is the dorsal webspace first and second which is smaller. ooHe has a large area of dry erythematous skin on the left buttock small open area here. 11/13/2018; the patient arrives in much better condition. The erythema around the wound on the left lateral calf is a lot better. Not sure whether this was the clindamycin or the TCA and ketoconazole or just in the improvement in edema control [stasis dermatitis]. In any case this is a lot better. The area on the left heel is very small and just about resolved using silver collagen we have been using silver alginate to the areas on his dorsal feet 11/20/2018; his wounds include the left lateral calf, left heel, dorsal aspects of both feet just proximal to the first second webspace. He is stable to slightly improved. I did not think any changes to his dressings were going to be necessary 11/27/2018 he has a reopening on the left buttock which is surrounded by what looks like tinea or perhaps some other form of dermatitis. The area on the left dorsal foot has some  erythema around it I have marked this area but I am not sure whether this is cellulitis or not. Left heel is not closed. Left calf the reopening is really slightly longer and probably worse 1/13; in general  things look better and smaller except for the left dorsal foot. Area on the left heel is just about closed, left buttock looks better only a small wound remains in the skin looks better [using Lotrisone] 1/20; the area on the left heel only has a few remaining open areas here. Left lateral calf about the same in terms of size, left dorsal foot slightly larger right lateral foot still not closed. The area on the left buttock has no open wound and the surrounding skin looks a lot better 1/27; the area on the left heel is closed. Left lateral calf better but still requiring extensive debridements. The area on his left buttock is closed. He still has the open areas on the left dorsal foot which is slightly smaller in the right foot which is slightly expanded. We have been using Iodoflex on these areas as well 2/3; left heel is closed. Left lateral calf still requiring debridement using Iodoflex there is no open area on his left buttock however he has dry scaly skin over a large area of this. Not really responding well to the Lotrisone. Finally the areas on his dorsal feet at the level of the first second webspace are slightly smaller on the right and about the same on the left. Both of these vigorously debrided with Anasept and gauze 2/10; left heel remains closed he has dry erythematous skin over the left buttock but there is no open wound here. Left lateral leg has come in and with. Still requiring debridement we have been using Iodoflex here. Finally the area on the left dorsal foot and right dorsal foot are really about the same extremely dry callused fissured areas. He does not yet have a dermatology appointment 2/17; left heel remains closed. He has a new open area on the left buttock. The area on  the left lateral calf is bigger longer and still covered in necrotic debris. No major change in his foot areas bilaterally. I am awaiting for a dermatologist to look on this. We have been using ketoconazole I do not know that this is been doing any good at all. 2/24; left heel remains closed. The left buttock wound that was new reopening last week looks better. The left lateral calf appears better also although still requires debridement. The major area on his foot is the left first second also requiring debridement. We have been putting Prisma on all wounds. I do not believe that the ketoconazole has done too much good for his feet. He will use Lotrisone I am going to give him a 2-week course of terbinafine. We still do not have a dermatology appointment 3/2 left heel remains closed however there is skin over bone in this area I pointed this out to him today. The left buttock wound is epithelialized but still does not look completely stable. The area on the left leg required debridement were using silver collagen here. With regards to his feet we changed to Lotrisone last week and silver alginate. 3/9; left heel remains closed. Left buttock remains closed. The area on the right foot is essentially closed. The left foot remains unchanged. Slightly smaller on the left lateral calf. Using silver collagen to both of these areas 3/16-Left heel remains closed. Area on right foot is closed. Left lateral calf above the lateral malleolus open wound requiring debridement with easy bleeding. Left dorsal wound proximal to first toe also debrided. Left ischial area open new. Patient has been using Prisma with wrapping every 3 days. Dermatology appointment  is apparently tomorrow.Patient has completed his terbinafine 2-week course with some apparent improvement according to him, there is still flaking and dry skin in his foot on the left 3/23; area on the right foot is reopened. The area on the left anterior foot  is about the same still a very necrotic adherent surface. He still has the area on the left leg and reopening is on the left buttock. He apparently saw dermatology although I do not have a note. According to the patient who is usually fairly well informed they did not have any good ideas. Put him on oral terbinafine which she is been on before. 3/30; using silver collagen to all wounds. Apparently his dermatologist put him on doxycycline and rifampin presumably some culture grew staph. I do not have this result. He remains on terbinafine although I have used terbinafine on him before 4/6; patient has had a fairly substantial reopening on the right foot between the first and second toes. He is finished his terbinafine and I believe is on doxycycline and rifampin still as prescribed by dermatology. We have been using silver collagen to all his wounds although the patient reports that he thinks silver alginate does better on the wounds on his buttock. 4/13; the area on his left lateral calf about the same size but it did not require debridement. ooLeft dorsal foot just proximal to the webspace between the first and second toes is about the same. Still nonviable surface. I note some superficial bronze discoloration of the dorsal part of his foot ooRight dorsal foot just proximal to the first and second toes also looks about the same. I still think there may be the same discoloration I noted above on the left ooLeft buttock wound looks about the same 4/20; left lateral calf appears to be gradually contracting using silver collagen. 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 The left dorsal foot wound is debrided of tightly adherent necrotic debris and really cleans up quite nicely. The right area is worse with expansion. I did not debride this it is now over the base of the second toe ooThe area on his left buttock is smaller no debridement  is required using silver collagen 5/4; left calf continues to make good progress. ooHe arrives with erythema around the wounds on his dorsal foot which even extends to the plantar aspect. Very concerning for coexistent infection. He is finished the erythromycin I gave him for possible erythrasma this does not seem to have helped. ooThe area on the left foot is about the same base of the dorsal toes ooIs area on the buttock looks improved on the left 5/11; left calf and left buttock continued to make good progress. Left foot is about the same to slightly improved. ooMajor problem is on the right foot. He has not had an x-ray. Deep tissue culture I did last week showed both Enterobacter and E. coli. I did not change the doxycycline I put him on empirically although neither 1 of these were plated to doxycycline. He arrives today with the erythema looking worse on both the dorsal and plantar foot. Macerated skin on the bottom of the foot. he has not been systemically unwell 5/18-Patient returns at 1 week, left calf wound appears to be making some progress, left buttock wound appears slightly worse than last time, left foot wound looks slightly better, right foot redness is marginally better. X-ray of both feet show no air or evidence of osteomyelitis. Patient is  finished his Omnicef and terbinafine. He continues to have macerated skin on the bottom of the left foot as well as right 5/26; left calf wound is better, left buttock wound appears to have multiple small superficial open areas with surrounding macerated skin. X-rays that I did last time showed no evidence of osteomyelitis in either foot. He is finished cefdinir and doxycycline. I do not think that he was on terbinafine. He continues to have a large superficial open area on the right foot anterior dorsal and slightly between the first and second toes. I did send him to dermatology 2 months ago or so wondering about whether they would do a  fungal scraping. I do not believe they did but did do a culture. We have been using silver alginate to the toe areas, he has been using antifungals at home topically either ketoconazole or Lotrisone. We are using silver collagen on the left foot, silver alginate on the right, silver collagen on the left lateral leg and silver alginate on the left buttock 6/1; left buttock area is healed. We have the left dorsal foot, left lateral leg and right dorsal foot. We are using silver alginate to the areas on both feet and silver collagen to the area on his left lateral calf 6/8; the left buttock apparently reopened late last week. He is not really sure how this happened. He is tolerating the terbinafine. Using silver alginate to all wounds 6/15; left buttock wound is larger than last week but still superficial. ooCame in the clinic today with a report of purulence from the left lateral leg I did not identify any infection ooBoth areas on his dorsal feet appear to be better. He is tolerating the terbinafine. Using silver alginate to all wounds 6/22; left buttock is about the same this week, left calf quite a bit better. His left foot is about the same however he comes in with erythema and warmth in the right forefoot once again. Culture that I gave him in the beginning of May showed Enterobacter and E. coli. I gave him doxycycline and things seem to improve although neither 1 of these organisms was specifically plated. 6/29; left buttock is larger and dry this week. Left lateral calf looks to me to be improved. Left dorsal foot also somewhat improved right foot completely unchanged. The erythema on the right foot is still present. He is completing the Ceftin dinner that I gave him empirically [see discussion above.) 7/6 - All wounds look to be stable and perhaps improved, the left buttock wound is slightly smaller, per patient bleeds easily, completed ceftin, the right foot redness is less, he is on  terbinafine 7/13; left buttock wound about the same perhaps slightly narrower. Area on the left lateral leg continues to narrow. Left dorsal foot slightly smaller right foot about the same. We are using silver alginate on the right foot and Hydrofera Blue to the areas on the left. Unna boot on the left 2 layer compression on the right 7/20; left buttock wound absolutely the same. Area on lateral leg continues to get better. Left dorsal foot require debridement as did the right no major change in the 7/27; left buttock wound the same size necrotic debris over the surface. The area on the lateral leg is closed once again. His left foot looks better right foot about the same although there is some involvement now of the posterior first second toe area. He is still on terbinafine which I have given him for a month,  not certain a centimeter major change 06/25/19-All wounds appear to be slightly improved according to report, left buttock wound looks clean, both foot wounds have minimal to no debris the right dorsal foot has minimal slough. We are using Hydrofera Blue to the left and silver alginate to the right foot and ischial wound. 8/10-Wounds all appear to be around the same, the right forefoot distal part has some redness which was not there before, however the wound looks clean and small. Ischial wound looks about the same with no changes 8/17; his wound on the left lateral calf which was his original chronic venous insufficiency wound remains closed. Since I last saw him the areas on the left dorsal foot right dorsal foot generally appear better but require debridement. The area on his left initial tuberosity appears somewhat larger to me perhaps hyper granulated and bleeds very easily. We have been using Hydrofera Blue to the left dorsal foot and silver alginate to everything else 8/24; left lateral calf remains closed. The areas on his dorsal feet on the webspace of the first and second toes  bilaterally both look better. The area on the left buttock which is the pressure ulcer stage II slightly smaller. I change the dressing to Hydrofera Blue to all areas 8/31; left lateral calf remains closed. The area on his dorsal feet bilaterally look better. Using Hydrofera Blue. Still requiring debridement on the left foot. No change in the left buttock pressure ulcers however 9/14; left lateral calf remains closed. Dorsal feet look quite a bit better than 2 weeks ago. Flaking dry skin also a lot better with the ammonium lactate I gave him 2 weeks ago. The area on the left buttock is improved. He states that his Roho cushion developed a leak and he is getting a new one, in the interim he is offloading this vigorously 9/21; left calf remains closed. Left heel which was a possible DTI looks better this week. He had macerated tissue around the left dorsal foot right foot looks satisfactory and improved left buttock wound. I changed his dressings to his feet to silver alginate bilaterally. Continuing Hydrofera Blue on the left buttock. 9/28 left calf remains closed. Left heel did not develop anything [possible DTI] dry flaking skin on the left dorsal foot. Right foot looks satisfactory. Improved left buttock wound. We are using silver alginate on his feet Hydrofera Blue on the buttock. I have asked him to go back to the Lotrisone on his feet including the wounds and surrounding areas 10/5; left calf remains closed. The areas on the left and right feet about the same. A lot of this is epithelialized however debris over the remaining open areas. He is using Lotrisone and silver alginate. The area on the left buttock using Hydrofera Blue 10/26. Patient has been out for 3 weeks secondary to Covid concerns. He tested negative but I think his wife tested positive. He comes in today with the left foot substantially worse, right foot about the same. Even more concerning he states that the area on his left  buttock closed over but then reopened and is considerably deeper in one aspect than it was before [stage III wound] 11/2; left foot really about the same as last week. Quarter sized wound on the dorsal foot just proximal to the first second toes. Surrounding erythema with areas of denuded epithelium. This is not really much different looking. Did not look like cellulitis this time however. ooRight foot area about the same.. We have been using  silver alginate alginate on his toes ooLeft buttock still substantial irritated skin around the wound which I think looks somewhat better. We have been using Hydrofera Blue here. 11/9; left foot larger than last week and a very necrotic surface. Right foot I think is about the same perhaps slightly smaller. Debris around the circumference also addressed. Unfortunately on the left buttock there is been a decline. Satellite lesions below the major wound distally and now a an additional one posteriorly we have been using Hydrofera Blue but I think this is a pressure issue 11/16; left foot ulcer dorsally again a very adherent necrotic surface. Right foot is about the same. Not much change in the pressure ulcer on his left buttock. 11/30; left foot ulcer dorsally basically the same as when I saw him 2 weeks ago. Very adherent fibrinous debris on the wound surface. Patient reports a lot of drainage as well. 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 We have been using Iodoflex, patient changed back to alginate because of the drainage. Area on his right dorsal foot really looks benign with a healthier surface certainly a lot better than on the left. Left buttock wounds all improved using Hydrofera Blue 12/7; left dorsal foot again no improvement. Tightly adherent debris. PCR culture I did last week only showed likely skin contaminant. I have gone ahead and done a punch biopsy of this which is about the last thing in  terms of investigations I can think to do. He has known venous insufficiency and venous hypertension and this could be the issue here. The area on the right foot is about the same left buttock slightly worse according to our intake nurse secondary to [facility] Blue sticking to the wound 12/14; biopsy of the left foot that I did last time showed changes that could be related to wound healing/chronic stasis dermatitis phenomenon no neoplasm. We have been using silver alginate to both feet. I change the one on the left today to Sorbact and silver alginate to his other 2 wounds 12/28; the patient arrives with the following problems; ooMajor issue is the dorsal left foot which continues to be a larger deeper wound area. Still with a completely nonviable surface ooParadoxically the area mirror image on the right on the right dorsal foot appears to be getting better. ooHe had some loss of dry denuded skin from the lower part of his original wound on the left lateral calf. Some of this area looked a little vulnerable and for this reason we put him in wrap that on this side this week ooThe area on his left buttock is larger. He still has the erythematous circular area which I think is a combination of pressure, sweat. This does not look like cellulitis or fungal dermatitis 11/26/2019; -Dorsal left foot large open wound with depth. Still debris over the surface. Using Sorbact ooThe area on the dorsal right foot paradoxically has closed over [place] has a reopening on the left ankle laterally at the base of his original wound that extended up into the calf. This appears clean. ooThe left buttock wound is smaller but with very adherent necrotic debris over the surface. We have been using silver alginate here as well The patient had arterial studies done in 2017. He had biphasic waveforms at the dorsalis pedis and posterior tibial bilaterally. ABI in the left was 1.17. Digit waveforms were dampened. He has  slight spasticity in the great toes I do not think a TBI  would be possible 1/11; the patient comes in today with a sizable reopening between the first and second toes on the right. This is not exactly in the same location where we have been treating wounds previously. According to our intake nurse this was actually fairly deep but 0.6 cm. The area on the left dorsal foot looks about the same the surface is somewhat cleaner using Sorbact, his MRI is in 2 days. We have not managed yet to get arterial studies. The new reopening on the left lateral calf looks somewhat better using alginate. The left buttock wound is about the same using alginate 1/18; the patient had his ARTERIAL studies which were quite normal. ABI in the right at 1.13 with triphasic/biphasic waveforms on the left ABI 1.06 again with triphasic/biphasic waveforms. It would not have been possible to have done a toe brachial index because of spasticity. We have been using Sorbac to the left foot alginate to the rest of his wounds on the right foot left lateral calf and left buttock 1/25; arrives in clinic with erythema and swelling of the left forefoot worse over the first MTP area. This extends laterally dorsally and but also posteriorly. Still has an area on the left lateral part of the lower part of his calf wound it is eschared and clearly not closed. ooArea on the left buttock still with surrounding irritation and erythema. ooRight foot surface wound dorsally. The area between the right and first and second toes appears better. 2/1; ooThe left foot wound is about the same. Erythema slightly better I gave him a week of doxycycline empirically ooRight foot wound is more extensive extending between the toes to the plantar surface ooLeft lateral calf really no open surface on the inferior part of his original wound however the entire area still looks vulnerable ooAbsolutely no improvement in the left buttock wound required  debridement. 2/8; the left foot is about the same. Erythema is slightly improved I gave him clindamycin last week. ooRight foot looks better he is using Lotrimin and silver alginate ooHe has a breakdown in the left lateral calf. Denuded epithelium which I have removed ooLeft buttock about the same were using Hydrofera Blue 2/15; left foot is about the same there is less surrounding erythema. Surface still has tightly adherent debris which I have debriding however not making any progress ooRight foot has a substantial wound on the medial right second toe between the first and second webspace. ooStill an open area on the left lateral calf distal area. ooButtock wound is about the same 2/22; left foot is about the same less surrounding erythema. Surface has adherent debris. Polymen Ag Right foot area significant wound between the first and second toes. We have been using silver alginate here Left lateral leg polymen Ag at the base of his original venous insufficiency wound ooLeft buttock some improvement here 3/1; ooRight foot is deteriorating in the first second toe webspace. Larger and more substantial. We have been using silver alginate. ooLeft dorsal foot about the same markedly adherent surface debris using PolyMem Ag ooLeft lateral calf surface debris using PolyMem AG ooLeft buttock is improved again using PolyMem Ag. ooHe is completing his terbinafine. The erythema in the foot seems better. He has been on this for 2 weeks 3/8; no improvement in any wound area in fact he has a small open area on the dorsal midfoot which is new this week. He has not gotten his foot x-rays yet 3/15; his x-rays were both negative for osteomyelitis  of both feet. No major change in any of his wounds on the extremities however his buttock wounds are better. We have been using polymen on the buttocks, left lower leg. Iodoflex on the left foot and silver alginate on the right 3/22; arrives in clinic  today with the 2 major issues are the improvement in the left dorsal foot wound which for once actually looks healthy with a nice healthy wound surface without debridement. Using Iodoflex here. Unfortunately on the left lateral calf which is in the distal part of his original wound he came to the clinic here for there was purulent drainage noted some increased breakdown scattered around the original area and a small area proximally. We we are using polymen here will change to silver alginate today. His buttock wound on the left is better and I think the area on the right first second toe webspace is also improved 3/29; left dorsal foot looks better. Using Iodoflex. Left ankle culture from deterioration last time grew E. coli, Enterobacter and Enterococcus. I will give him a course of cefdinir although that will not cover Enterococcus. The area on the right foot in the webspace of the first and second toe lateral first toe looks better. The area on his buttock is about healed Vascular appointment is on April 21. This is to look at his venous system vis--vis continued breakdown of the wounds on the left including the left lateral leg and left dorsal foot he. He has had previous ablations on this side 4/5; the area between the right first and second toes lateral aspect of the first toe looks better. Dorsal aspect of the left first toe on the left foot also improved. Unfortunately the left lateral lower leg is larger and there is a second satellite wound superiorly. The usual superficial abrasions on the left buttock overall better but certainly not closed 4/12; the area between the right first and second toes is improved. Dorsal aspect of the left foot also slightly smaller with a vibrant healthy looking surface. No real change in the left lateral leg and the left buttock wound is healed He has an unaffordable co-pay for Apligraf. Appointment with vein and vascular with regards to the left leg venous  part of the circulation is on 4/21 4/19; we continue to see improvement in all wound areas. Although this is minor. He has his vascular appointment on 4/21. The area on the left buttock has not reopened although right in the center of this area the skin looks somewhat threatened 4/26; the left buttock is unfortunately reopened. In general his left dorsal foot has a healthy surface and looks somewhat smaller although it was not measured as such. The area between his first and second toe webspace on the right as a small wound against the first toe. The patient saw vascular surgery. The real question I was asking was about the small saphenous vein on the left. He has previously ablated left greater saphenous vein. Nothing further was commented on on the left. Right greater saphenous vein without reflux at the saphenofemoral junction or proximal thigh there was no indication for ablation of the right greater saphenous vein duplex was negative for DVT bilaterally. They did not think there was anything from a vascular surgery point of view that could be offered. They ABIs within normal limits 5/3; only small open area on the left buttock. The area on the left lateral leg which was his original venous reflux is now 2 wounds both which look clean.  We are using Iodoflex on the left dorsal foot which looks healthy and smaller. He is down to a very tiny area between the right first and second toes, using silver alginate 5/10; all of his wounds appear better. We have much better edema control in 4 layer compression on the left. This may be the factor that is allowing the left foot and left lateral calf to heal. He has external compression garments at home 04/14/20-All of his wounds are progressing well, the left forefoot is practically closed, left ischium appears to be about the same, right toe webspace is also smaller. The left lateral leg is about the same, continue using Hydrofera Blue to this, silver alginate  to the ischium, Iodoflex to the toe space on the right 6/7; most of his wounds outside of the left buttock are doing well. The area on the left lateral calf and left dorsal foot are smaller. The area on the right foot in between the first and second toe webspace is barely visible although he still says there is some drainage here is the only reason I did not heal this out. ooUnfortunately the area on the left buttock almost looks like he has a skin tear from tape. He has open wound and then a large flap of skin that we are trying to get adherence over an area just next to the remaining wound 6/21; 2 week follow-up. I believe is been here for nurse visits. Miraculously the area between his first and second toes on the left dorsal foot is closed over. Still open on the right first second web space. The left lateral calf has 2 open areas. Distally this is more superficial. The proximal area had a little more depth and required debridement of adherent necrotic material. His buttock wound is actually larger we have been using silver alginate here 6/28; the patient's area on the left foot remains closed. Still open wet area between the first and second toes on the right and also extending into the plantar aspect. We have been using silver alginate in this location. He has 2 areas on the left lower leg part of his original long wounds which I think are better. We have been using Hydrofera Blue here. Hydrofera Blue to the left buttock which is stable 7/12; left foot remains closed. Left ankle is closed. May be a small area between his right first and second toes the only truly open area is on the left buttock. We have been using Hydrofera Blue here 7/19; patient arrives with marked deterioration especially in the left foot and ankle. We did not put him in a compression wrap on the left last week in fact he wore his juxta lite stockings on either side although he does not have an underlying stocking. He has a  reopening on the left dorsal foot, left lateral ankle and a new area on the right dorsal ankle. More worrisome is the degree of erythema on the left foot extending on the lateral foot into the lateral lower leg on the left 7/26; the patient had erythema and drainage from the lateral left ankle last week. Culture of this grew MRSA resistant to doxycycline and clindamycin which are the 2 antibiotics we usually use with this patient who has multiple antibiotic allergies including linezolid, trimethoprim sulfamethoxazole. I had give him an empiric doxycycline and he comes in the area certainly looks somewhat better although it is blotchy in his lower leg. He has not been systemically unwell. He has had areas  on the left dorsal foot which is a reopening, chronic wounds on the left lateral ankle. Both of these I think are secondary to chronic venous insufficiency. The area between his first and second toes is closed as far as I can tell. He had a new wrap injury on the right dorsal ankle last week. Finally he has an area on the left buttock. We have been using silver alginate to everything except the left buttock we are using Hydrofera Blue 06/30/20-Patient returns at 1 week, has been given a sample dose pack of NUZYRA which is a tetracycline derivative [omadacycline], patient has completed those, we have been using silver alginate to almost all the wounds except the left ischium where we are using Hydrofera Blue all of them look better 8/16; since I last saw the patient he has been doing well. The area on the left buttock, left lateral ankle and left foot are all closed today. He has completed the [place] I gave him last time and tolerated this well. He still has open areas on the right dorsal ankle and in the right first second toe area which we are using silver alginate. 8/23; we put him in his bilateral external compression stockings last week as he did not have anything open on either leg except for  concerning area between the right first and second toe. He comes in today with an area on the left dorsal foot slightly more proximal than the original wound, the left lateral foot but this is actually a continuation of the area he had on the left lateral ankle from last time. As well he is opened up on the left buttock again. 8/30; comes in today with things looking a lot better. The area on the left lower ankle has closed down as has the left foot but with eschar in both areas. The area on the dorsal right ankle is also epithelialized. Very little remaining of the left buttock wound. We have been using silver alginate on all wound areas 9/13; the area in the first second toe webspace on the right has fully epithelialized. He still has some vulnerable epithelium on the right and the ankle and the dorsal foot. He notes weeping. He is using his juxta lite stocking. On the left again the left dorsal foot is closed left lateral ankle is closed. We went to the juxta lite stocking here as well. ooStill vulnerable in the left buttock although only 2 small open areas remain here 9/27; 2-week follow-up. We did not look at his left leg but the patient says everything is closed. He is a bit disturbed by the amount of edema in his left foot he is using juxta lite stockings but asking about over the toes stockings which would be 30/40, will talk to him next time. According to him there is no open wound on either the left foot or the left ankle/calf He has an open area on the dorsal right calf which I initially point a wrap injury. He has superficial remaining wound on the left ischial tuberosity been using silver alginate although he says this sticks to the wound 10/5; we gave him 2-week follow-up but he called yesterday expressing some concerns about his right foot right ankle and the left buttock. He came in early. There is still no open areas on the left leg and that still in his juxta lite stocking 10/11;  he only has 1 small area on the left buttock that remains measuring millimeters 1 mm. Still has  the same irritated skin in this area. We recommended zinc oxide when this eventually closes and pressure relief is meticulously is he can do this. He still has an area on the dorsal part of his right first through third toes which is a bit irritated and still open and on the dorsal ankle near the crease of the ankle. We have been using silver alginate and using his own stocking. He has nothing open on the left leg or foot 10/25; 2-week follow-up. Not nearly as good on the left buttock as I was hoping. For open areas with 5 looking threatened small. He has the erythematous irritated chronic skin in this area. oo1 area on the right dorsal ankle. He reports this area bleeds easily ooRight dorsal foot just proximal to the base of his toes ooWe have been using silver alginate. 11/8; 2-week follow-up. Left buttock is about the same although I do not think the wounds are in the same location we have been using silver alginate. I have asked him to use zinc oxide on the skin around the wounds. ooHe still has a small area on the right dorsal ankle he reports this bleeds easily ooRight dorsal foot just proximal to the base of the toes does not have anything open although the skin is very dry and scaly ooHe has a new opening on the nailbed of the left great toe. Nothing on the left ankle 11/29; 3-week follow-up. Left buttock has 2 open areas. And washing of these wounds today started bleeding easily. Suggesting very friable tissue. We have been using silver alginate. Right dorsal ankle which I thought was initially a wrap injury we have been using silver alginate. Nothing open between the toes that I can see. He states the area on the left dorsal toe nailbed healed after the last visit in 2 or 3 days 12/13; 3-week follow-up. His left buttock now has 3 open areas but the original 2 areas are smaller using  polymen here. Surrounding skin looks better. The right dorsal ankle is closed. He has a small opening on the right dorsal foot at the level of the third toe. In general the skin looks better here. He is wearing his juxta lite stocking on the left leg says there is nothing open 11/24/2020; 3 weeks follow-up. His left buttock still has the 3 open areas. We have been using polymen but due to lack of response he changed to [facility] area. Surrounding skin is dry erythematous and irritated looking. There is no evidence of infection either bacterial or fungal however there is loss of surface epithelium ooHe still has very dry skin in his foot causing irritation and erythema on the dorsal part of his toes. This is not responded to prolonged courses of antifungal simply looks dry and irritated 1/24; left buttock area still looks about the same he was unable to find the triad ointment that we had suggested. The area on the right lower leg just above the dorsal ankle has reopened and the areas on the right foot between the first second and second third toes and scaling on the bottom of the foot has been about the same for quite some time now. been using silver alginate to all wound areas 2/7; left buttock wound looked quite good although not much smaller in terms of surface area surrounding skin looks better. Only a few dry flaking areas on the right foot in between the first and second toes the skin generally looks better here [ammonium lactate]. Finally  the area on the right dorsal ankle is closed 2/21; ooThere is no open area on the right foot even between the right first and second toe. Skin around this area dorsally and plantar aspects look better. ooHe has a reopening of the area on the right ankle just above the crease of the ankle dorsally. I continue to think that this is probably friction from spasms may be even this time with his stocking under the compression stockings. ooWounds on his left  buttock look about the same there a couple of areas that have reopened. He has a total square area of loss of epithelialization. This does not look like infection it looks like a contact dermatitis but I just cannot determine to what 3/14; there is nothing on the right foot between the first and second toes this was carefully inspected under illumination. Some chronic irritation on the dorsal part of his foot from toes 1-3 at the base. Nothing really open here substantially. Still has an area on the right foot/ankle that is actually larger and hyper granulated. His buttock area on the left is just about closed however he has chronic inflammation with loss of the surface epithelial layer 3/28; 2-week follow-up. In clinic today with a new wound on the left anterior mid tibia. Says this happened about 2 weeks ago. He is not really sure how wonders about the spasticity of his legs at night whether that could have caused this other than that he does not have a good idea. He has been using topical antibiotics and silver alginate. The area on his right dorsal ankle seems somewhat better. ooFinally everything on his left buttock is closed. 4/11; 2-week follow-up. All of his wounds are better except for the area over the ischium and left buttock which have opened up widely again. At least part of this is covered in necrotic fibrinous material another part had rolled nonviable skin. The area on the right ankle, left anterior mid tibia are both a lot better. He had no open wounds on either foot including the areas between the first and second toes 4/25; patient presents for 2-week follow-up. He states that the wounds are overall stable. He has no complaints today and states he is using Hydrofera Blue to open wounds. 5/9; have not seen this man in over a month. For my memory he has open areas on the left mid tibia and right ankle. T oday he has new open area on the right dorsal foot which we have not had a  problem with recently. He has the sustained area on the left buttock He is also changed his insurance at the beginning of the year [employer]. We will need prior authorizations for debridement 5/23; patient presents for 2-week follow-up. He has prior authorizations for debridement. He denies any issues in the past 2 weeks with his wound care. He has been using Hydrofera Blue to all the wounds. He does report a circular rash to the upper left leg that is new. He denies acute signs of infection. 6/6; 2-week follow-up. The patient has open wounds on the left buttock which are worse than the last time I saw this about a month ago. He also has a new area to me on the left anterior mid tibia with some surrounding erythema. The area on the dorsal ankle on the right is closed but I think this will be a friction injury every time this area is exposed to either our wraps or his compression stockings caused by  unrelenting spasms in this leg. 6/20; 2-week follow-up. oo The patient has open wounds on the left buttock which is about the same. Using [facility] here. - The left mid tibia has a static amount of surrounding erythema. Also a raised area in the center. We have been using Hydrofera Blue here. ooooFinally he has broken down in his dorsal right foot extending between the first and second toes and going to the base of the first and second toe webspace. I have previously assumed that this was severe venous hypertension 7/5; 2-week follow-up oo The left buttock wound actually looks better. We are using Hydrofera Blue. He has extensive skin irritation around this area and I have not really been able to get that any better. I have tried Lotrisone i.e. antifungals and steroids. More most recently we have just been using Coloplast really looks about the same. oo The left mid tibia which was new last week culture to have very resistant staph aureus. Not only methicillin-resistant but doxycycline  resistant. The patient has a plethora of antibiotic allergies including sulfa, linezolid. I used topical bacitracin on this but he has not started this yet. oo In addition he has an expanding area of erythema with a wound on the dorsal right foot. I did a deep tissue culture of this area today 7/12; oo Left buttock area actually looks better surrounding skin also looks less irritated. oo Left mid tibia looks about the same. He is using bacitracin this is not worse oo Right dorsal foot looks about the same as well. oo The left first toe also looks about the same 7/19; left buttock wound continues to improve in terms of open areas oo Left mid tibia is still concerning amount of swelling he is using bacitracin oo Dorsal left first toe somewhat smaller oo Right dorsal foot somewhat smaller 7/25; left buttock wound actually continues to improve oo Left mid tibia area has less swelling. I gave him all my samples of new Nuzyra. This seems to have helped although the wound is still open it. His abrasion closed by here oo Left dorsal great toe really no better. Still a very nonviable surface oo Right dorsal foot perhaps some better. oo We have been using bacitracin and silver nitrate to the areas on his lower legs and Hydrofera Blue to the area on the buttock. 8/16 oo Disappointed that his left buttock wound is actually more substantial. Apparently during the last nurse visit these were both very small. He has continued irritation to a large area of skin on his buttock. I have never been able to totally explain this although I think it some combination of the way he sits, pressure, moisture. He is not incontinent enough to contribute to this. oo Left dorsal great toe still fibrinous debris on the surface that I have debrided today oo Large area across the dorsal right toes. oo The area on the left anterior mid tibia has less swelling. He completed the [place]. This does not look  infected although the tissue is still fried 8/30; 2-week follow-up. oo Left buttock areas not improved. We used Hydrofera Blue on this. Weeping wet with the surrounding erythema that I have not been able to control even with Lotrisone and topical Coloplast oo Left dorsal great toe looks about the same oo More substantial area again at the base of his toes on the left which is new this week. oo Area across the dorsal right toes looks improved oo The left anterior mid tibia looks  like it is trying to close Objective Constitutional Sitting or standing Blood Pressure is within target range for patient.. Pulse regular and within target range for patient.Marland Kitchen Respirations regular, non-labored and within target range.. Temperature is normal and within the target range for the patient.Marland Kitchen Appears in no distress. Vitals Time Taken: 8:03 AM, Height: 70 in, Weight: 216 lbs, BMI: 31, Temperature: 98.4 F, Pulse: 121 bpm, Respiratory Rate: 18 breaths/min, Blood Pressure: 123/77 mmHg. General Notes: Wound exam; right dorsal foot actually looks some better less erythema required debridement with a #5 curette but actually cleans up quite nicely oo Substantial area on the left dorsal foot at the base of his toes which is new this required an extensive debridement of very gritty adherent material. oo Left dorsal first toe about the same also debrided of nonviable surface material oo The area on the left dorsal mid tibia looks better there is no irritation and inflammation Integumentary (Hair, Skin) Wound #41R status is Open. Original cause of wound was Gradually Appeared. The date acquired was: 03/16/2020. The wound has been in treatment 70 weeks. The wound is located on the Left Ischium. The wound measures 1.3cm length x 1cm width x 0.1cm depth; 1.021cm^2 area and 0.102cm^3 volume. There is Fat Layer (Subcutaneous Tissue) exposed. There is no tunneling or undermining noted. There is a medium amount of  sanguinous drainage noted. The wound margin is distinct with the outline attached to the wound base. There is large (67-100%) red, pink, friable granulation within the wound bed. There is no necrotic tissue within the wound bed. Wound #51 status is Open. Original cause of wound was Trauma. The date acquired was: 01/26/2021. The wound has been in treatment 22 weeks. The wound is located on the Left,Anterior Lower Leg. The wound measures 1.5cm length x 0.5cm width x 0.1cm depth; 0.589cm^2 area and 0.059cm^3 volume. There is Fat Layer (Subcutaneous Tissue) exposed. There is no tunneling or undermining noted. There is a medium amount of serosanguineous drainage noted. The wound margin is distinct with the outline attached to the wound base. There is large (67-100%) red, friable, hyper - granulation within the wound bed. There is no necrotic tissue within the wound bed. Wound #52 status is Open. Original cause of wound was Gradually Appeared. The date acquired was: 03/27/2021. The wound has been in treatment 16 weeks. The wound is located on the Right,Dorsal Foot. The wound measures 2cm length x 4cm width x 0.1cm depth; 6.283cm^2 area and 0.628cm^3 volume. There is Fat Layer (Subcutaneous Tissue) exposed. There is no tunneling or undermining noted. There is a medium amount of serosanguineous drainage noted. The wound margin is distinct with the outline attached to the wound base. There is small (1-33%) pink granulation within the wound bed. There is a large (67-100%) amount of necrotic tissue within the wound bed including Adherent Slough. Wound #54 status is Open. Original cause of wound was Pressure Injury. The date acquired was: 05/11/2021. The wound has been in treatment 10 weeks. The wound is located on the Left,Distal Ischium. The wound measures 5cm length x 3.5cm width x 0.1cm depth; 13.744cm^2 area and 1.374cm^3 volume. There is Fat Layer (Subcutaneous Tissue) exposed. There is a medium amount of  sanguinous drainage noted. The wound margin is distinct with the outline attached to the wound base. There is large (67-100%) red, friable granulation within the wound bed. There is no necrotic tissue within the wound bed. Wound #55 status is Open. Original cause of wound was Blister. The date acquired  was: 05/26/2021. The wound has been in treatment 8 weeks. The wound is located on the Left T Great. The wound measures 0.7cm length x 1.4cm width x 0.2cm depth; 0.77cm^2 area and 0.154cm^3 volume. There is bone and Fat oe Layer (Subcutaneous Tissue) exposed. There is no tunneling or undermining noted. There is a medium amount of serosanguineous drainage noted. The wound margin is distinct with the outline attached to the wound base. There is medium (34-66%) pink granulation within the wound bed. There is a medium (34-66%) amount of necrotic tissue within the wound bed including Adherent Slough. Wound #56 status is Open. Original cause of wound was Gradually Appeared. The date acquired was: 07/11/2021. The wound is located on the Left,Dorsal Foot. The wound measures 3.3cm length x 2.4cm width x 0.1cm depth; 6.22cm^2 area and 0.622cm^3 volume. There is Fat Layer (Subcutaneous Tissue) exposed. There is no tunneling or undermining noted. There is a medium amount of purulent drainage noted. The wound margin is distinct with the outline attached to the wound base. There is large (67-100%) red granulation within the wound bed. There is a small (1-33%) amount of necrotic tissue within the wound bed including Adherent Slough. 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 There was a Excisional Skin/Subcutaneous Tissue Debridement with a total area of 8 sq cm performed by Ricard Dillon., MD. With the following instrument(s): Curette to remove Non-Viable tissue/material. Material removed includes Subcutaneous Tissue, Slough, and Skin: Dermis. No specimens were taken. A time out was conducted at 08:33, prior to the start of the procedure. A Minimum amount of bleeding was controlled with Pressure. The procedure was tolerated well. Post Debridement Measurements: 2cm length x 4cm width x 0.1cm depth; 0.628cm^3 volume. Character of Wound/Ulcer Post Debridement is stable. 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 There was a Excisional Skin/Subcutaneous Tissue oe Debridement with a total area of 0.98 sq cm performed by Ricard Dillon., MD. With the following instrument(s): Curette to remove Non-Viable tissue/material. Material removed includes Subcutaneous Tissue and Skin: Dermis and. No specimens were taken. A time out was conducted at 08:33, prior to the start of the procedure. A Minimum amount of bleeding was controlled with Pressure. The procedure was tolerated well. Post Debridement Measurements: 0.7cm length x 1.4cm width x 0.2cm depth; 0.154cm^3 volume. Character of Wound/Ulcer Post Debridement is stable. Post procedure Diagnosis Wound #55: Same as Pre-Procedure Wound #56 Pre-procedure diagnosis of Wound #56 is a Venous Leg Ulcer located on the Left,Dorsal Foot .Severity of Tissue Pre Debridement is: Fat layer exposed. There was a Excisional Skin/Subcutaneous Tissue Debridement with a total area of 7.92 sq cm performed by Ricard Dillon., MD. With the  following instrument(s): Curette to remove Non-Viable tissue/material. Material removed includes Subcutaneous Tissue, Slough, and Skin: Dermis. No specimens were taken. A time out was conducted at 08:33, prior to the start of the procedure. A Minimum amount of bleeding was controlled with  Pressure. The procedure was tolerated well. Post Debridement Measurements: 3.3cm length x 2.4cm width x 0.1cm depth; 0.622cm^3 volume. Character of Wound/Ulcer Post Debridement is stable. Severity of Tissue Post Debridement is: Fat layer exposed. Post procedure Diagnosis Wound #56: Same as Pre-Procedure Plan Follow-up Appointments: Return Appointment in 2 weeks. - with Dr. Dellia Nims Bathing/ Shower/ Hygiene: May shower and wash wound with soap and water. - on days that dressing is changed Edema Control - Lymphedema / SCD / Other: Elevate legs to the level of the heart or above for 30 minutes daily and/or when sitting, a frequency of: - throughout the day Compression stocking or Garment 30-40 mm/Hg pressure to: - Juxtalite to both legs daily Off-Loading: Roho cushion for wheelchair Turn and reposition every 2 hours WOUND #41R: - Ischium Wound Laterality: Left Cleanser: Soap and Water Every Other Day/30 Days Discharge Instructions: May shower and wash wound with dial antibacterial soap and water prior to dressing change. Peri-Wound Care: Triad Hydrophilic Wound Dressing Tube, 6 (oz) Every Other Day/30 Days Discharge Instructions: Apply to periwound with each dressing change Peri-Wound Care: Lotrisone Every Other Day/30 Days Discharge Instructions: thin layer under triad cream. Prim Dressing: KerraCel Ag Gelling Fiber Dressing, 4x5 in (silver alginate) Every Other Day/30 Days ary Discharge Instructions: Apply silver alginate to wound bed as instructed Secondary Dressing: ComfortFoam Border, 4x4 in (silicone border) Every Other Day/30 Days Discharge Instructions: Apply over primary dressing as directed. WOUND  #51: - Lower Leg Wound Laterality: Left, Anterior Cleanser: Soap and Water Every Other Day/30 Days Discharge Instructions: May shower and wash wound with dial antibacterial soap and water prior to dressing change. Prim Dressing: KerraCel Ag Gelling Fiber Dressing, 4x5 in (silver alginate) Every Other Day/30 Days ary Discharge Instructions: Apply silver alginate to wound bed as instructed Secondary Dressing: ComfortFoam Border, 4x4 in (silicone border) Every Other Day/30 Days Discharge Instructions: Apply over primary dressing as directed. WOUND #52: - Foot Wound Laterality: Dorsal, Right Cleanser: Soap and Water Every Other Day/30 Days Discharge Instructions: May shower and wash wound with dial antibacterial soap and water prior to dressing change. Prim Dressing: KerraCel Ag Gelling Fiber Dressing, 4x5 in (silver alginate) Every Other Day/30 Days ary Discharge Instructions: Apply silver alginate to wound bed as instructed Secondary Dressing: Woven Gauze Sponge, Non-Sterile 4x4 in Every Other Day/30 Days Discharge Instructions: Apply over primary dressing as directed. Secured With: [employer], 4.5x3.1 (in/yd) Every Other Day/30 Days Discharge Instructions: Secure with Kerlix as directed. Secured With: [employer] T ape, 2x10 (in/yd) Every Other Day/30 Days Discharge Instructions: Secure dressing with tape as directed. WOUND #54: - Ischium Wound Laterality: Left, Distal Cleanser: Soap and Water Every Other Day/30 Days Discharge Instructions: May shower and wash wound with dial antibacterial soap and water prior to dressing change. Peri-Wound Care: Triad Hydrophilic Wound Dressing Tube, 6 (oz) Every Other Day/30 Days Discharge Instructions: Apply to periwound with each dressing change Peri-Wound Care: Lotrisone Every Other Day/30 Days Discharge Instructions: thin layer under triad cream. Prim Dressing: KerraCel Ag Gelling Fiber Dressing, 4x5 in (silver alginate) Every Other  Day/30 Days ary Discharge Instructions: Apply silver alginate to wound bed as instructed Secondary Dressing: ComfortFoam Border, 4x4 in (silicone border) Every Other Day/30 Days Discharge Instructions: Apply over primary dressing as directed. WOUND #55: - T Great Wound Laterality: Left oe Cleanser: Soap and Water Every Other Day/30 Days Discharge Instructions: May shower and wash wound with dial antibacterial soap and water prior to dressing change. Prim Dressing: KerraCel Ag Gelling Fiber Dressing,  4x5 in (silver alginate) Every Other Day/30 Days ary Discharge Instructions: Apply silver alginate to wound bed as instructed Secondary Dressing: Woven Gauze Sponge, Non-Sterile 4x4 in Every Other Day/30 Days Discharge Instructions: Apply over primary dressing as directed. Secured With: [employer], 4.5x3.1 (in/yd) Every Other Day/30 Days Discharge Instructions: Secure with Kerlix as directed. Secured With: [employer] T ape, 2x10 (in/yd) Every Other Day/30 Days Discharge Instructions: Secure dressing with tape as directed. WOUND #56: - Foot Wound Laterality: Dorsal, Left Cleanser: Soap and Water Every Other Day/30 Days Discharge Instructions: May shower and wash wound with dial antibacterial soap and water prior to dressing change. Prim Dressing: KerraCel Ag Gelling Fiber Dressing, 4x5 in (silver alginate) Every Other Day/30 Days ary Discharge Instructions: Apply silver alginate to wound bed as instructed Secondary Dressing: Woven Gauze Sponge, Non-Sterile 4x4 in Every Other Day/30 Days Discharge Instructions: Apply over primary dressing as directed. Secured With: [employer], 4.5x3.1 (in/yd) Every Other Day/30 Days Discharge Instructions: Secure with Kerlix as directed. Secured With: [employer] T ape, 2x10 (in/yd) Every Other Day/30 Days Discharge Instructions: Secure dressing with tape as directed. 1. Switch to silver alginate to all wounds 2. On the  buttock still the Lotrisone and the Coloplast 3. Still using his juxta lites. His edema control is fairly good 4. I am not exactly certain why he has broken down on the left on the dorsal foot. 5. The area on the mid tibia actually looks quite a bit better this is the area we treated for an infection Electronic Signature(s) Signed: 07/22/2021 7:53:49 AM By: Linton Ham MD Entered By: Linton Ham on 07/21/2021 09:14:43 -------------------------------------------------------------------------------- SuperBill Details Patient Name: Date of Service: [name], [name] 07/21/2021 Medical Record Number: [id number] Patient Account Number: [id number] Date of Birth/Sex: Treating RN: 01/10/1986 (35 y.o. [name] Primary Care Provider: [place], [place] Other Clinician: Referring Provider: Treating Provider/Extender: [name]: 289 Diagnosis Coding ICD-10 Codes Code Description I87.332 Chronic venous hypertension (idiopathic) with ulcer and inflammation of left lower extremity L97.511 Non-pressure chronic ulcer of other part of right foot limited to breakdown of skin G82.21 Paraplegia, complete L89.323 Pressure ulcer of left buttock, stage 3 L97.821 Non-pressure chronic ulcer of other part of left lower leg limited to breakdown of skin L97.521 Non-pressure chronic ulcer of other part of left foot limited to breakdown of skin A49.02 Methicillin resistant Staphylococcus aureus infection, unspecified site Facility Procedures CPT4 Code: [id number] Description: [id number] - DEB SUBQ TISSUE 20 SQ CM/< ICD-10 Diagnosis Description L97.521 Non-pressure chronic ulcer of other part of left foot limited to breakdown of L97.511 Non-pressure chronic ulcer of other part of right foot limited to breakdown of Modifier: skin skin Quantity: 1 Physician Procedures : CPT4 Code Description Modifier [id number] - WC PHYS SUBQ TISS 20 SQ CM ICD-10 Diagnosis Description  L97.521 Non-pressure chronic ulcer of other part of left foot limited to breakdown of skin L97.511 Non-pressure chronic ulcer of other part of right  foot limited to breakdown of skin Quantity: 1 Electronic Signature(s) Signed: 07/22/2021 7:53:49 AM By: Linton Ham MD Entered By: Linton Ham on 07/21/2021 09:14:54

## 2021-07-22 NOTE — Progress Notes (Signed)
[name], [name] ([id number]) Visit Report for 07/21/2021 Arrival Information Details Patient Name: Date of Service: [name], [name] 07/21/2021 8:00 [name] Medical Record Number: [id number] Patient Account Number: [id number] Date of Birth/Sex: Treating RN: 04-Aug-1986 (35 y.o. [name] Primary Care Pernella Ackerley: [place], [place] Other Clinician: Referring Val Farnam: Treating Ersel Wadleigh/Extender: [name]: 289 Visit Information History Since Last Visit Added or deleted any medications: No Patient Arrived: Wheel Chair Any new allergies or adverse reactions: No Arrival Time: 08:01 Had [name] fall or experienced change in No Transfer Assistance: None activities of daily living that may affect Patient Identification Verified: Yes risk of falls: Secondary Verification Process Completed: Yes Signs or symptoms of abuse/neglect since last visito No Patient Requires Transmission-Based Precautions: No Hospitalized since last visit: No Patient Has Alerts: Yes Implantable device outside of the clinic excluding No Patient Alerts: R ABI = 1.0 cellular tissue based products placed in the center L ABI = 1.1 since last visit: Has Dressing in Place as Prescribed: Yes Pain Present Now: No Electronic Signature(s) Signed: 07/21/2021 5:23:58 PM By: Lorrin Jackson Entered By: Lorrin Jackson on 07/21/2021 08:03:50 -------------------------------------------------------------------------------- Encounter Discharge Information Details Patient Name: Date of Service: [name], [name] 07/21/2021 8:00 [name] Medical Record Number: [id number] Patient Account Number: [id number] Date of Birth/Sex: Treating RN: 18-Mar-1986 (35 y.o. [name] Primary Care Amberlie Gaillard: [place], [place] Other Clinician: Referring Maurion Walkowiak: Treating Quayshaun Hubbert/Extender: [name]: 289 Encounter Discharge Information Items Post Procedure Vitals Discharge Condition:  Stable Temperature (F): 98.4 Ambulatory Status: Wheelchair Pulse (bpm): 121 Discharge Destination: Home Respiratory Rate (breaths/min): 18 Transportation: Private Auto Blood Pressure (mmHg): 123/77 Schedule Follow-up Appointment: Yes Clinical Summary of Care: Provided on 07/21/2021 Form Type Recipient Paper Patient Patient Electronic Signature(s) Signed: 07/21/2021 5:23:58 PM By: Lorrin Jackson Entered By: Lorrin Jackson on 07/21/2021 09:05:43 -------------------------------------------------------------------------------- Lower Extremity Assessment Details Patient Name: Date of Service: [name], [name] 07/21/2021 8:00 [name] Medical Record Number: [id number] Patient Account Number: [id number] Date of Birth/Sex: Treating RN: [birth year] (35 y.o. [name] Primary Care Kayvion Arneson: [place], [place] Other Clinician: Referring Anniya Whiters: Treating Keyarah Mcroy/Extender: Dutch Gray, [name]: 289 Edema Assessment Assessed: [Left: Yes] [Right: Yes] Edema: [Left: Yes] [Right: Yes] Calf Left: Right: Point of Measurement: 33 cm From Medial Instep 28 cm 33.5 cm Ankle Left: Right: Point of Measurement: 10 cm From Medial Instep 25 cm 23.5 cm Vascular Assessment Pulses: Dorsalis Pedis Palpable: [Left:Yes] [Right:Yes] Electronic Signature(s) Signed: 07/21/2021 5:23:58 PM By: Lorrin Jackson Entered By: Lorrin Jackson on 07/21/2021 08:13:47 -------------------------------------------------------------------------------- Multi Wound Chart Details Patient Name: Date of Service: [name], [name] 07/21/2021 8:00 [name] Medical Record Number: [id number] Patient Account Number: [id number] Date of Birth/Sex: Treating RN: Mar 31, 1986 (35 y.o. [name] Primary Care Braylen Staller: [name], [name] Other Clinician: Referring Rashawn Rayman: Treating Evone Arseneau/Extender: Dutch Gray, [name]: 3 Vital Signs Height(in): 70 Pulse(bpm):  121 Weight(lbs): 216 Blood Pressure(mmHg): 123/77 Body Mass Index(BMI): 31 Temperature(F): 98.4 Respiratory Rate(breaths/min): 18 Photos: [41R:Left Ischium] [51:Left, Anterior Lower Leg] [52:Right, Dorsal Foot] Wound Location: [41R:Gradually Appeared] [51:Trauma] [52:Gradually Appeared] Wounding Event: [41R:Pressure Ulcer] [51:Trauma, Other] [52:Trauma, Other] Primary Etiology: [41R:Sleep Apnea, Hypertension, Paraplegia Sleep Apnea, Hypertension, Paraplegia Sleep Apnea, Hypertension, Paraplegia] Comorbid History: [41R:03/16/2020] [51:01/26/2021] [52:03/27/2021] Date Acquired: [41R:70] [51:22] [52:16] Weeks of Treatment: [41R:Open] [51:Open] [52:Open] Wound Status: [41R:Yes] [51:No] [52:No] Wound Recurrence: [41R:Yes] [51:No] [52:No] Clustered Wound: [41R:1] [51:[name]] [52:[name]] Clustered Quantity: [41R:1.3x1x0.1] [51:1.5x0.5x0.1] [52:2x4x0.1] Measurements L x W x D (  cm) [41R:1.021] [51:0.589] [52:6.283] [name] (cm) : rea [41R:0.102] [51:0.059] [52:0.628] Volume (cm) : [41R:43.50%] [51:-78.50%] [52:-233.30%] % Reduction in [name] rea: [41R:43.60%] [51:-78.80%] [52:-234.00%] % Reduction in Volume: [41R:Category/Stage II] [51:Full Thickness Without Exposed] [52:Full Thickness Without Exposed] Classification: [41R:Medium] [51:Support Structures Medium] [52:Support Structures Medium] Exudate [name] mount: [41R:Sanguinous] [51:Serosanguineous] [52:Serosanguineous] Exudate Type: [41R:red] [51:red, brown] [52:red, brown] Exudate Color: [41R:Distinct, outline attached] [51:Distinct, outline attached] [52:Distinct, outline attached] Wound Margin: [41R:Large (67-100%)] [51:Large (67-100%)] [52:Small (1-33%)] Granulation [name] mount: [41R:Red, Pink, Friable] [51:Red, Hyper-granulation, Friable] [52:Pink] Granulation Quality: [41R:None Present (0%)] [51:None Present (0%)] [52:Large (67-100%)] Necrotic [name] mount: [41R:Fat Layer (Subcutaneous Tissue): Yes Fat Layer (Subcutaneous Tissue): Yes Fat Layer (Subcutaneous Tissue):  Yes] Exposed Structures: [41R:Fascia: No Tendon: No Muscle: No Joint: No Bone: No Medium (34-66%)] [51:Fascia: No Tendon: No Muscle: No Joint: No Bone: No Small (1-33%)] [52:Fascia: No Tendon: No Muscle: No Joint: No Bone: No None] Epithelialization: [41R:[name]] [51:[name]] [52:Debridement - Excisional] Debridement: Pre-procedure Verification/Time Out [name] [51:[name]] [52:08:33] Taken: [41R:[name]] [51:[name]] [52:Subcutaneous, Slough] Tissue Debrided: [41R:[name]] [51:[name]] [52:Skin/Subcutaneous Tissue] Level: [41R:[name]] [51:[name]] [52:8] Debridement [name] (sq cm): [41R:rea [name]] [51:[name]] [52:Curette] Instrument: [41R:[name]] [51:[name]] [52:Minimum] Bleeding: [41R:[name]] [51:[name]] [52:Pressure] Hemostasis [name] chieved: [41R:[name]] [51:[name]] [52:Procedure was tolerated well] Debridement Treatment Response: [41R:[name]] [51:[name]] [52:2x4x0.1] Post Debridement Measurements L x W x D (cm) [41R:[name]] [51:[name]] [52:0.628] Post Debridement Volume: (cm) [41R:[name]] [51:[name]] [52:Debridement] Wound Number: [id number] Photos: Left, Distal Ischium Left T Great oe Left, Dorsal Foot Wound Location: Pressure Injury Blister Gradually Appeared Wounding Event: 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 Primary Etiology: Sleep Apnea, Hypertension, Paraplegia Sleep Apnea, Hypertension, Paraplegia Sleep Apnea, Hypertension, Paraplegia Comorbid History: 05/11/2021 05/26/2021 07/11/2021 Date Acquired: 10 8 0 Weeks of Treatment: Open Open Open Wound Status: No No No Wound Recurrence: No No No Clustered Wound: [name] [name] [name] Clustered Quantity: 5x3.5x0.1 0.7x1.4x0.2 3.3x2.4x0.1 Measurements L x W x D (cm) 13.744 0.77 6.22 [name] (cm) : rea 1.374 0.154 0.622 Volume (cm) : -872.00% 1.90% [name] % Reduction in [name] rea: -874.50% -94.90% [name] % Reduction in Volume: Category/Stage III Full Thickness With Exposed Support Full Thickness Without Exposed Classification: Structures Support Structures Medium Medium Medium Exudate  Amount: Sanguinous Serosanguineous Purulent Exudate Type: red red, brown yellow, brown, green Exudate Color: Distinct, outline attached Distinct, outline attached Distinct, outline attached Wound Margin: Large (67-100%) Medium (34-66%) Large (67-100%) Granulation Amount: Red, Friable Pink Red Granulation Quality: None Present (0%) Medium (34-66%) Small (1-33%) Necrotic Amount: Fat Layer (Subcutaneous Tissue): Yes Fat Layer (Subcutaneous Tissue): Yes Fat Layer (Subcutaneous Tissue): Yes Exposed Structures: Fascia: No Bone: Yes Fascia: No Tendon: No Fascia: No Tendon: No Muscle: No Tendon: No Muscle: No Joint: No Muscle: No Joint: No Bone: No Joint: No Bone: No None None None Epithelialization: [name] Debridement - Excisional Debridement - Excisional Debridement: Pre-procedure Verification/Time Out [name] 08:33 08:33 Taken: [name] Subcutaneous Subcutaneous, Slough Tissue Debrided: [name] Skin/Subcutaneous Tissue Skin/Subcutaneous Tissue Level: [name] 0.98 7.92 Debridement [name] (sq cm): rea [name] Curette Curette Instrument: [name] Minimum Minimum Bleeding: [name] Pressure Pressure Hemostasis [name] chieved: [name] Procedure was tolerated well Procedure was tolerated well Debridement Treatment Response: [name] 0.7x1.4x0.2 3.3x2.4x0.1 Post Debridement Measurements L x W x D (cm) [name] 0.154 0.622 Post Debridement Volume: (cm) [name] Debridement Debridement Procedures Performed: Treatment Notes Wound #41R (Ischium) Wound Laterality: Left Cleanser Soap and Water Discharge Instruction: May shower and wash wound with dial antibacterial soap and water prior to dressing change. Peri-Wound Care Triad Hydrophilic Wound Dressing Tube, 6 (oz) Discharge Instruction: Apply to periwound with each  dressing change Lotrisone Discharge Instruction: thin layer under triad cream. Topical Primary Dressing KerraCel Ag Gelling Fiber Dressing, 4x5 in (silver alginate) Discharge Instruction: Apply silver alginate  to wound bed as instructed Secondary Dressing ComfortFoam Border, 4x4 in (silicone border) Discharge Instruction: Apply over primary dressing as directed. Secured With Compression Wrap Compression Stockings Add-Ons Wound #51 (Lower Leg) Wound Laterality: Left, Anterior Cleanser Soap and Water Discharge Instruction: May shower and wash wound with dial antibacterial soap and water prior to dressing change. Peri-Wound Care Topical Primary Dressing KerraCel Ag Gelling Fiber Dressing, 4x5 in (silver alginate) Discharge Instruction: Apply silver alginate to wound bed as instructed Secondary Dressing ComfortFoam Border, 4x4 in (silicone border) Discharge Instruction: Apply over primary dressing as directed. Secured With Compression Wrap Compression Stockings Add-Ons Wound #52 (Foot) Wound Laterality: Dorsal, Right Cleanser Soap and Water Discharge Instruction: May shower and wash wound with dial antibacterial soap and water prior to dressing change. Peri-Wound Care Topical Primary Dressing KerraCel Ag Gelling Fiber Dressing, 4x5 in (silver alginate) Discharge Instruction: Apply silver alginate to wound bed as instructed Secondary Dressing Woven Gauze Sponge, Non-Sterile 4x4 in Discharge Instruction: Apply over primary dressing as directed. Secured With [employer], 4.5x3.1 (in/yd) Discharge Instruction: Secure with Kerlix as directed. Transpore Surgical Tape, 2x10 (in/yd) Discharge Instruction: Secure dressing with tape as directed. Compression Wrap Compression Stockings Add-Ons Wound #54 (Ischium) Wound Laterality: Left, Distal Cleanser Soap and Water Discharge Instruction: May shower and wash wound with dial antibacterial soap and water prior to dressing change. Peri-Wound Care Triad Hydrophilic Wound Dressing Tube, 6 (oz) Discharge Instruction: Apply to periwound with each dressing change Lotrisone Discharge Instruction: thin layer under triad  cream. Topical Primary Dressing KerraCel Ag Gelling Fiber Dressing, 4x5 in (silver alginate) Discharge Instruction: Apply silver alginate to wound bed as instructed Secondary Dressing ComfortFoam Border, 4x4 in (silicone border) Discharge Instruction: Apply over primary dressing as directed. Secured With Compression Wrap Compression Stockings Add-Ons Wound #55 (Toe Great) Wound Laterality: Left Cleanser Soap and Water Discharge Instruction: May shower and wash wound with dial antibacterial soap and water prior to dressing change. Peri-Wound Care Topical Primary Dressing KerraCel Ag Gelling Fiber Dressing, 4x5 in (silver alginate) Discharge Instruction: Apply silver alginate to wound bed as instructed Secondary Dressing Woven Gauze Sponge, Non-Sterile 4x4 in Discharge Instruction: Apply over primary dressing as directed. Secured With [employer], 4.5x3.1 (in/yd) Discharge Instruction: Secure with Kerlix as directed. Transpore Surgical Tape, 2x10 (in/yd) Discharge Instruction: Secure dressing with tape as directed. Compression Wrap Compression Stockings Add-Ons Wound #56 (Foot) Wound Laterality: Dorsal, Left Cleanser Soap and Water Discharge Instruction: May shower and wash wound with dial antibacterial soap and water prior to dressing change. Peri-Wound Care Topical Primary Dressing KerraCel Ag Gelling Fiber Dressing, 4x5 in (silver alginate) Discharge Instruction: Apply silver alginate to wound bed as instructed Secondary Dressing Woven Gauze Sponge, Non-Sterile 4x4 in Discharge Instruction: Apply over primary dressing as directed. Secured With [employer], 4.5x3.1 (in/yd) Discharge Instruction: Secure with Kerlix as directed. Transpore Surgical Tape, 2x10 (in/yd) Discharge Instruction: Secure dressing with tape as directed. Compression Wrap Compression Stockings Add-Ons Electronic Signature(s) Signed: 07/21/2021 5:24:19 PM By: Rhae Hammock  RN Signed: 07/22/2021 7:53:49 AM By: Linton Ham MD Entered By: Linton Ham on 07/21/2021 09:10:03 -------------------------------------------------------------------------------- Multi-Disciplinary Care Plan Details Patient Name: Date of Service: [place], [place] 07/21/2021 8:00 [name] Medical Record Number: [id number] Patient Account Number: [id number] Date of Birth/Sex: Treating RN: [birth year] (35 y.o. [name] Primary Care Ladd Cen: [place], [place]  Other Clinician: Referring Layana Konkel: Treating Ileana Chalupa/Extender: [name]: [place] reviewed with physician Active Inactive Wound/Skin Impairment Nursing Diagnoses: Impaired tissue integrity Knowledge deficit related to ulceration/compromised skin integrity Goals: Patient/caregiver will verbalize understanding of skin care regimen Date Initiated: 01/05/2016 Target Resolution Date: 08/07/2021 Goal Status: Active Ulcer/skin breakdown will have [name] volume reduction of 30% by week 4 Date Initiated: 01/05/2016 Date Inactivated: 12/22/2017 Target Resolution Date: 01/19/2018 Unmet Reason: complex wounds, Goal Status: Unmet infection Interventions: Assess patient/caregiver ability to obtain necessary supplies Assess ulceration(s) every visit Provide education on ulcer and skin care Notes: 02/02/21: Complex Care, ongoing. Electronic Signature(s) Signed: 07/21/2021 5:23:58 PM By: Lorrin Jackson Entered By: Lorrin Jackson on 07/21/2021 08:31:01 -------------------------------------------------------------------------------- Pain Assessment Details Patient Name: Date of Service: Hach, [name] 07/21/2021 8:00 [name] Medical Record Number: [id number] Patient Account Number: [id number] Date of Birth/Sex: Treating RN: 1986/03/27 (35 y.o. [name] Primary Care Eisen Robenson: [place], [place] Other Clinician: Referring Marzell Isakson: Treating Paelyn Smick/Extender: [name]: 289 Active Problems Location of Pain Severity and Description of Pain Patient Has Paino No Site Locations Pain Management and Medication Current Pain Management: Electronic Signature(s) Signed: 07/21/2021 5:23:58 PM By: Lorrin Jackson Entered By: Lorrin Jackson on 07/21/2021 08:05:55 -------------------------------------------------------------------------------- Patient/Caregiver Education Details Patient Name: Date of Service: Alessio, [name] Viviann Spare 8/30/2022andnbsp8:00 [name] Medical Record Number: [id number] Patient Account Number: [id number] Date of Birth/Gender: Treating RN: 11/26/1985 (35 y.o. [name] Primary Care Physician: Janine Limbo Other Clinician: Referring Physician: Treating Physician/Extender: [name]: 289 Education Assessment Education Provided To: Patient Education Topics Provided Pressure: Methods: Explain/Verbal, Printed Responses: State content correctly Venous: Methods: Explain/Verbal, Printed Responses: State content correctly Wound/Skin Impairment: Methods: Explain/Verbal, Printed Responses: State content correctly Electronic Signature(s) Signed: 07/21/2021 5:23:58 PM By: Lorrin Jackson Entered By: Lorrin Jackson on 07/21/2021 08:31:31 -------------------------------------------------------------------------------- Wound Assessment Details Patient Name: Date of Service: Stivers, [name] 07/21/2021 8:00 [name] Medical Record Number: [id number] Patient Account Number: [id number] Date of Birth/Sex: Treating RN: March 15, 1986 (35 y.o. [name] Primary Care Chaunda Vandergriff: [name], [name] Other Clinician: Referring Colden Samaras: Treating Tana Trefry/Extender: [name]: 289 Wound Status Wound Number: [id number] Primary Etiology: Pressure Ulcer Wound Location: Left Ischium Wound Status: Open Wounding Event: Gradually Appeared Comorbid  History: Sleep Apnea, Hypertension, Paraplegia Date Acquired: 03/16/2020 Weeks Of Treatment: 70 Clustered Wound: Yes Photos Wound Measurements Length: (cm) 1.3 Width: (cm) 1 Depth: (cm) 0.1 Clustered Quantity: 1 Area: (cm) 1.021 Volume: (cm) 0.102 % Reduction in Area: 43.5% % Reduction in Volume: 43.6% Epithelialization: Medium (34-66%) Tunneling: No Undermining: No Wound Description Classification: Category/Stage II Wound Margin: Distinct, outline attached Exudate Amount: Medium Exudate Type: Sanguinous Exudate Color: red Foul Odor After Cleansing: No Slough/Fibrino No Wound Bed Granulation Amount: Large (67-100%) Exposed Structure Granulation Quality: Red, Pink, Friable Fascia Exposed: No Necrotic Amount: None Present (0%) Fat Layer (Subcutaneous Tissue) Exposed: Yes Tendon Exposed: No Muscle Exposed: No Joint Exposed: No Bone Exposed: No Treatment Notes Wound #41R (Ischium) Wound Laterality: Left Cleanser Soap and Water Discharge Instruction: May shower and wash wound with dial antibacterial soap and water prior to dressing change. Peri-Wound Care Triad Hydrophilic Wound Dressing Tube, 6 (oz) Discharge Instruction: Apply to periwound with each dressing change Lotrisone Discharge Instruction: thin layer under triad cream. Topical Primary Dressing KerraCel Ag Gelling Fiber Dressing, 4x5 in (silver alginate) Discharge Instruction: Apply silver alginate to wound bed as instructed Secondary Dressing ComfortFoam Border,  4x4 in (silicone border) Discharge Instruction: Apply over primary dressing as directed. Secured With Compression Wrap Compression Stockings Environmental education officer) Signed: 07/21/2021 5:23:58 PM By: Lorrin Jackson Entered By: Lorrin Jackson on 07/21/2021 08:27:38 -------------------------------------------------------------------------------- Wound Assessment Details Patient Name: Date of Service: Filosa, [name] 07/21/2021 8:00 [name]  [name] Medical Record Number: [id number] Patient Account Number: [id number] Date of Birth/Sex: Treating RN: [birth year] (35 y.o. [name] Primary Care Rhyse Skowron: [name], [name] Other Clinician: Referring Nyara Capell: Treating Madisan Bice/Extender: [name]: 289 Wound Status Wound Number: 51 Primary Etiology: Trauma, Other Wound Location: Left, Anterior Lower Leg Wound Status: Open Wounding Event: Trauma Comorbid History: Sleep Apnea, Hypertension, Paraplegia Date Acquired: 01/26/2021 Weeks Of Treatment: 22 Clustered Wound: No Photos Wound Measurements Length: (cm) 1.5 Width: (cm) 0.5 Depth: (cm) 0.1 Area: (cm) 0.589 Volume: (cm) 0.059 % Reduction in Area: -78.5% % Reduction in Volume: -78.8% Epithelialization: Small (1-33%) Tunneling: No Undermining: No Wound Description Classification: Full Thickness Without Exposed Support Structures Wound Margin: Distinct, outline attached Exudate Amount: Medium Exudate Type: Serosanguineous Exudate Color: red, brown Foul Odor After Cleansing: No Slough/Fibrino No Wound Bed Granulation Amount: Large (67-100%) Exposed Structure Granulation Quality: Red, Hyper-granulation, Friable Fascia Exposed: No Necrotic Amount: None Present (0%) Fat Layer (Subcutaneous Tissue) Exposed: Yes Tendon Exposed: No Muscle Exposed: No Joint Exposed: No Bone Exposed: No Treatment Notes Wound #51 (Lower Leg) Wound Laterality: Left, Anterior Cleanser Soap and Water Discharge Instruction: May shower and wash wound with dial antibacterial soap and water prior to dressing change. Peri-Wound Care Topical Primary Dressing KerraCel Ag Gelling Fiber Dressing, 4x5 in (silver alginate) Discharge Instruction: Apply silver alginate to wound bed as instructed Secondary Dressing ComfortFoam Border, 4x4 in (silicone border) Discharge Instruction: Apply over primary dressing as directed. Secured With Compression  Wrap Compression Stockings Environmental education officer) Signed: 07/21/2021 5:23:58 PM By: Lorrin Jackson Entered By: Lorrin Jackson on 07/21/2021 08:16:04 -------------------------------------------------------------------------------- Wound Assessment Details Patient Name: Date of Service: Brzozowski, [name] 07/21/2021 8:00 [name] Medical Record Number: [id number] Patient Account Number: [id number] Date of Birth/Sex: Treating RN: [birth year] (35 y.o. [name] Primary Care Shay Jhaveri: [name], [name] Other Clinician: Referring Aishwarya Shiplett: Treating Akaisha Truman/Extender: [name]: 289 Wound Status Wound Number: 52 Primary Etiology: Trauma, Other Wound Location: Right, Dorsal Foot Wound Status: Open Wounding Event: Gradually Appeared Comorbid History: Sleep Apnea, Hypertension, Paraplegia Date Acquired: 03/27/2021 Weeks Of Treatment: 16 Clustered Wound: No Photos Wound Measurements Length: (cm) 2 Width: (cm) 4 Depth: (cm) 0.1 Area: (cm) 6.283 Volume: (cm) 0.628 % Reduction in Area: -233.3% % Reduction in Volume: -234% Epithelialization: None Tunneling: No Undermining: No Wound Description Classification: Full Thickness Without Exposed Support Structures Wound Margin: Distinct, outline attached Exudate Amount: Medium Exudate Type: Serosanguineous Exudate Color: red, brown Foul Odor After Cleansing: No Slough/Fibrino Yes Wound Bed Granulation Amount: Small (1-33%) Exposed Structure Granulation Quality: Pink Fascia Exposed: No Necrotic Amount: Large (67-100%) Fat Layer (Subcutaneous Tissue) Exposed: Yes Necrotic Quality: Adherent Slough Tendon Exposed: No Muscle Exposed: No Joint Exposed: No Bone Exposed: No Treatment Notes Wound #52 (Foot) Wound Laterality: Dorsal, Right Cleanser Soap and Water Discharge Instruction: May shower and wash wound with dial antibacterial soap and water prior to dressing change. Peri-Wound  Care Topical Primary Dressing KerraCel Ag Gelling Fiber Dressing, 4x5 in (silver alginate) Discharge Instruction: Apply silver alginate to wound bed as instructed Secondary Dressing Woven Gauze Sponge, Non-Sterile 4x4 in Discharge Instruction: Apply over primary dressing as directed. Secured With [employer]  Sterile, 4.5x3.1 (in/yd) Discharge Instruction: Secure with Kerlix as directed. Transpore Surgical Tape, 2x10 (in/yd) Discharge Instruction: Secure dressing with tape as directed. Compression Wrap Compression Stockings Add-Ons Electronic Signature(s) Signed: 07/21/2021 5:23:58 PM By: Lorrin Jackson Entered By: Lorrin Jackson on 07/21/2021 08:23:53 -------------------------------------------------------------------------------- Wound Assessment Details Patient Name: Date of Service: [place], [name] 07/21/2021 8:00 [name] Medical Record Number: [id number] Patient Account Number: [id number] Date of Birth/Sex: Treating RN: March 06, 1986 (35 y.o. [name] Primary Care Jaxzen Vanhorn: [name], [name] Other Clinician: Referring Titus Drone: Treating Lennie Vasco/Extender: [name]: 289 Wound Status Wound Number: 54 Primary Etiology: Pressure Ulcer Wound Location: Left, Distal Ischium Wound Status: Open Wounding Event: Pressure Injury Comorbid History: Sleep Apnea, Hypertension, Paraplegia Date Acquired: 05/11/2021 Weeks Of Treatment: 10 Clustered Wound: No Photos Wound Measurements Length: (cm) 5 Width: (cm) 3.5 Depth: (cm) 0.1 Area: (cm) 13.744 Volume: (cm) 1.374 % Reduction in Area: -872% % Reduction in Volume: -874.5% Epithelialization: None Wound Description Classification: Category/Stage III Wound Margin: Distinct, outline attached Exudate Amount: Medium Exudate Type: Sanguinous Exudate Color: red Foul Odor After Cleansing: No Slough/Fibrino No Wound Bed Granulation Amount: Large (67-100%) Exposed Structure Granulation  Quality: Red, Friable Fascia Exposed: No Necrotic Amount: None Present (0%) Fat Layer (Subcutaneous Tissue) Exposed: Yes Tendon Exposed: No Muscle Exposed: No Joint Exposed: No Bone Exposed: No Treatment Notes Wound #54 (Ischium) Wound Laterality: Left, Distal Cleanser Soap and Water Discharge Instruction: May shower and wash wound with dial antibacterial soap and water prior to dressing change. Peri-Wound Care Triad Hydrophilic Wound Dressing Tube, 6 (oz) Discharge Instruction: Apply to periwound with each dressing change Lotrisone Discharge Instruction: thin layer under triad cream. Topical Primary Dressing KerraCel Ag Gelling Fiber Dressing, 4x5 in (silver alginate) Discharge Instruction: Apply silver alginate to wound bed as instructed Secondary Dressing ComfortFoam Border, 4x4 in (silicone border) Discharge Instruction: Apply over primary dressing as directed. Secured With Compression Wrap Compression Stockings Environmental education officer) Signed: 07/21/2021 5:23:58 PM By: Lorrin Jackson Entered By: Lorrin Jackson on 07/21/2021 08:28:39 -------------------------------------------------------------------------------- Wound Assessment Details Patient Name: Date of Service: [place], [name] 07/21/2021 8:00 [name] Medical Record Number: [id number] Patient Account Number: [id number] Date of Birth/Sex: Treating RN: 06-28-1986 (35 y.o. [name] Primary Care Chanc Kervin: [name], [name] Other Clinician: Referring Taleigh Gero: Treating Bravery Ketcham/Extender: [name]: 289 Wound Status Wound Number: 55 Primary Etiology: Neuropathic Ulcer-Non Diabetic Wound Location: Left T [place] oe Wound Status: Open Wounding Event: Blister Comorbid History: Sleep Apnea, Hypertension, Paraplegia Date Acquired: 05/26/2021 Weeks Of Treatment: 8 Clustered Wound: No Photos Wound Measurements Length: (cm) 0.7 Width: (cm) 1.4 Depth: (cm) 0.2 Area: (cm)  0.77 Volume: (cm) 0.154 % Reduction in Area: 1.9% % Reduction in Volume: -94.9% Epithelialization: None Tunneling: No Undermining: No Wound Description Classification: Full Thickness With Exposed Support Structures Wound Margin: Distinct, outline attached Exudate Amount: Medium Exudate Type: Serosanguineous Exudate Color: red, brown Foul Odor After Cleansing: No Slough/Fibrino Yes Wound Bed Granulation Amount: Medium (34-66%) Exposed Structure Granulation Quality: Pink Fascia Exposed: No Necrotic Amount: Medium (34-66%) Fat Layer (Subcutaneous Tissue) Exposed: Yes Necrotic Quality: Adherent Slough Tendon Exposed: No Muscle Exposed: No Joint Exposed: No Bone Exposed: Yes Treatment Notes Wound #55 (Toe Great) Wound Laterality: Left Cleanser Soap and Water Discharge Instruction: May shower and wash wound with dial antibacterial soap and water prior to dressing change. Peri-Wound Care Topical Primary Dressing KerraCel Ag Gelling Fiber Dressing, 4x5 in (silver alginate) Discharge Instruction: Apply silver alginate to wound bed as  instructed Secondary Dressing Woven Gauze Sponge, Non-Sterile 4x4 in Discharge Instruction: Apply over primary dressing as directed. Secured With [employer], 4.5x3.1 (in/yd) Discharge Instruction: Secure with Kerlix as directed. Transpore Surgical Tape, 2x10 (in/yd) Discharge Instruction: Secure dressing with tape as directed. Compression Wrap Compression Stockings Add-Ons Electronic Signature(s) Signed: 07/21/2021 5:23:58 PM By: Lorrin Jackson Entered By: Lorrin Jackson on 07/21/2021 08:19:02 -------------------------------------------------------------------------------- Wound Assessment Details Patient Name: Date of Service: [place], [name] 07/21/2021 8:00 [name] Medical Record Number: [id number] Patient Account Number: [id number] Date of Birth/Sex: Treating RN: June 08, 1986 (35 y.o. [name] Primary Care Patsy Varma: [place],  [place] Other Clinician: Referring Nas Wafer: Treating Brewster Wolters/Extender: [name]: 289 Wound Status Wound Number: 56 Primary Etiology: Venous Leg Ulcer Wound Location: Left, Dorsal Foot Wound Status: Open Wounding Event: Gradually Appeared Comorbid History: Sleep Apnea, Hypertension, Paraplegia Date Acquired: 07/11/2021 Weeks Of Treatment: 0 Clustered Wound: No Photos Wound Measurements Length: (cm) 3.3 Width: (cm) 2.4 Depth: (cm) 0.1 Area: (cm) 6.22 Volume: (cm) 0.622 % Reduction in Area: % Reduction in Volume: Epithelialization: None Tunneling: No Undermining: No Wound Description Classification: Full Thickness Without Exposed Support Structures Wound Margin: Distinct, outline attached Exudate Amount: Medium Exudate Type: Purulent Exudate Color: yellow, brown, green Wound Bed Granulation Amount: Large (67-100%) Granulation Quality: Red Necrotic Amount: Small (1-33%) Necrotic Quality: Adherent Slough Foul Odor After Cleansing: No Slough/Fibrino Yes Exposed Structure Fascia Exposed: No Fat Layer (Subcutaneous Tissue) Exposed: Yes Tendon Exposed: No Muscle Exposed: No Joint Exposed: No Bone Exposed: No Electronic Signature(s) Signed: 07/21/2021 5:23:58 PM By: Lorrin Jackson Entered By: Lorrin Jackson on 07/21/2021 08:22:13 -------------------------------------------------------------------------------- Vitals Details Patient Name: Date of Service: [place], [name] 07/21/2021 8:00 [name] Medical Record Number: [id number] Patient Account Number: [id number] Date of Birth/Sex: Treating RN: 02/21/1986 (35 y.o. [name] Primary Care Akemi Overholser: [place], [place] Other Clinician: Referring Kazimir Hartnett: Treating Jeania Nater/Extender: [name]: 289 Vital Signs Time Taken: 08:03 Temperature (F): 98.4 Height (in): 70 Pulse (bpm): 121 Weight (lbs): 216 Respiratory Rate (breaths/min):  18 Body Mass Index (BMI): 31 Blood Pressure (mmHg): 123/77 Reference Range: 80 - 120 mg / dl Electronic Signature(s) Signed: 07/21/2021 5:23:58 PM By: Lorrin Jackson Entered By: Lorrin Jackson on 07/21/2021 08:04:08

## 2021-08-04 ENCOUNTER — Other Ambulatory Visit: Payer: Self-pay

## 2021-08-04 ENCOUNTER — Encounter (HOSPITAL_BASED_OUTPATIENT_CLINIC_OR_DEPARTMENT_OTHER): Payer: 59 | Attending: Internal Medicine | Admitting: Internal Medicine

## 2021-08-04 DIAGNOSIS — G8221 Paraplegia, complete: Secondary | ICD-10-CM | POA: Diagnosis not present

## 2021-08-04 DIAGNOSIS — I87332 Chronic venous hypertension (idiopathic) with ulcer and inflammation of left lower extremity: Secondary | ICD-10-CM | POA: Insufficient documentation

## 2021-08-04 DIAGNOSIS — L97821 Non-pressure chronic ulcer of other part of left lower leg limited to breakdown of skin: Secondary | ICD-10-CM | POA: Insufficient documentation

## 2021-08-04 DIAGNOSIS — L89323 Pressure ulcer of left buttock, stage 3: Secondary | ICD-10-CM | POA: Diagnosis not present

## 2021-08-04 DIAGNOSIS — L97521 Non-pressure chronic ulcer of other part of left foot limited to breakdown of skin: Secondary | ICD-10-CM | POA: Diagnosis not present

## 2021-08-04 DIAGNOSIS — L97511 Non-pressure chronic ulcer of other part of right foot limited to breakdown of skin: Secondary | ICD-10-CM | POA: Diagnosis not present

## 2021-08-05 NOTE — Progress Notes (Signed)
[name], [name] ([id number]) Visit Report for 08/04/2021 Arrival Information Details Patient Name: Date of Service: [name], [name] 08/04/2021 8:00 [name] Medical Record Number: [id number] Patient Account Number: [id number] Date of Birth/Sex: Treating RN: [birth year] (35 y.o. [name] Primary Care Revere Maahs: [name], [name] Other Clinician: Referring Tehillah Cipriani: Treating Wadsworth Skolnick/Extender: [name]: 40 Visit Information History Since Last Visit Added or deleted any medications: No Patient Arrived: Wheel Chair Any new allergies or adverse reactions: No Arrival Time: 08:09 Had [name] fall or experienced change in No Accompanied By: self activities of daily living that may affect Transfer Assistance: None risk of falls: Patient Identification Verified: Yes Signs or symptoms of abuse/neglect since last visito No Secondary Verification Process Completed: Yes Hospitalized since last visit: No Patient Requires Transmission-Based Precautions: No Implantable device outside of the clinic excluding No Patient Has Alerts: Yes cellular tissue based products placed in the center Patient Alerts: R ABI = 1.0 since last visit: L ABI = 1.1 Has Dressing in Place as Prescribed: Yes Pain Present Now: No Electronic Signature(s) Signed: 08/04/2021 10:48:45 AM By: Sandre Kitty Entered By: Sandre Kitty on 08/04/2021 08:11:00 -------------------------------------------------------------------------------- Encounter Discharge Information Details Patient Name: Date of Service: [name], [name] 08/04/2021 8:00 [name] Medical Record Number: [id number] Patient Account Number: [id number] Date of Birth/Sex: Treating RN: [birth year] (35 y.o. [name] Primary Care Clarita Mcelvain: [place], [place] Other Clinician: Referring Dajanique Robley: Treating Keagon Glascoe/Extender: [name]: 291 Encounter Discharge Information Items Post Procedure  Vitals Discharge Condition: Stable Temperature (F): 98.4 Ambulatory Status: Wheelchair Pulse (bpm): 114 Discharge Destination: Home Respiratory Rate (breaths/min): 18 Transportation: Private Auto Blood Pressure (mmHg): 129/62 Accompanied By: alone Schedule Follow-up Appointment: Yes Clinical Summary of Care: Patient Declined Electronic Signature(s) Signed: 08/04/2021 5:46:57 PM By: Levan Hurst RN, BSN Entered By: Levan Hurst on 08/04/2021 13:38:46 -------------------------------------------------------------------------------- Lower Extremity Assessment Details Patient Name: Date of Service: [name], [name] 08/04/2021 8:00 [name] Medical Record Number: [id number] Patient Account Number: [id number] Date of Birth/Sex: Treating RN: 03/22/1986 (35 y.o. [name] Primary Care Aemilia Dedrick: Ali Molina, [place] Other Clinician: Referring Madison Albea: Treating Johathan Province/Extender: [name]: 291 Edema Assessment Assessed: [Left: No] [Right: No] Edema: [Left: Yes] [Right: Yes] Calf Left: Right: Point of Measurement: 33 cm From Medial Instep 28 cm 33.5 cm Ankle Left: Right: Point of Measurement: 10 cm From Medial Instep 25 cm 23.5 cm Vascular Assessment Pulses: Dorsalis Pedis Palpable: [Left:Yes] [Right:Yes] Electronic Signature(s) Signed: 08/04/2021 5:46:57 PM By: Levan Hurst RN, BSN Entered By: Levan Hurst on 08/04/2021 08:36:10 -------------------------------------------------------------------------------- Multi Wound Chart Details Patient Name: Date of Service: [name], [name] 08/04/2021 8:00 [name] Medical Record Number: [id number] Patient Account Number: [id number] Date of Birth/Sex: Treating RN: 17-Jun-1986 (35 y.o. [name] Primary Care Graig Hessling: [name], [name] Other Clinician: Referring Raevyn Sokol: Treating Ivonna Kinnick/Extender: Dutch Gray, [name]: 291 Vital Signs Height(in): 70 Pulse(bpm):  114 Weight(lbs): 216 Blood Pressure(mmHg): 129/62 Body Mass Index(BMI): 31 Temperature(F): 98.4 Respiratory Rate(breaths/min): 18 Photos: [41R:Left Ischium] [51:Left, Anterior Lower Leg] [52:Right, Dorsal Foot] Wound Location: [41R:Gradually Appeared] [51:Trauma] [52:Gradually Appeared] Wounding Event: [41R:Pressure Ulcer] [51:Trauma, Other] [52:Trauma, Other] Primary Etiology: [41R:Sleep Apnea, Hypertension, Paraplegia Sleep Apnea, Hypertension, Paraplegia Sleep Apnea, Hypertension, Paraplegia] Comorbid History: [41R:03/16/2020] [51:01/26/2021] [52:03/27/2021] Date Acquired: [41R:72] [51:24] [52:18] Weeks of Treatment: [41R:Open] [51:Open] [52:Open] Wound Status: [41R:Yes] [51:No] [52:No] Wound Recurrence: [41R:Yes] [51:No] [52:No] Clustered Wound: [41R:1] [51:[name]] [52:[name]] Clustered Quantity: [41R:1.2x1x0.1] [51:1x0.5x0.1] [52:2x4.1x0.1] Measurements L x  W x D (cm) [41R:0.942] [51:0.393] [52:6.44] [name] (cm) : rea [41R:0.094] [51:0.039] [52:0.644] Volume (cm) : [41R:47.80%] [51:-19.10%] [52:-241.60%] % Reduction in [name] rea: [41R:48.10%] [51:-18.20%] [52:-242.60%] % Reduction in Volume: [41R:Category/Stage II] [51:Full Thickness Without Exposed] [52:Full Thickness Without Exposed] Classification: [41R:Medium] [51:Support Structures Medium] [52:Support Structures Medium] Exudate [name] mount: [41R:Sanguinous] [51:Serosanguineous] [52:Serosanguineous] Exudate Type: [41R:red] [51:red, brown] [52:red, brown] Exudate Color: [41R:Distinct, outline attached] [51:Distinct, outline attached] [52:Distinct, outline attached] Wound Margin: [41R:Large (67-100%)] [51:Large (67-100%)] [52:Small (1-33%)] Granulation [name] mount: [41R:Red, Pink, Friable] [51:Red, Hyper-granulation, Friable] [52:Pink] Granulation Quality: [41R:None Present (0%)] [51:None Present (0%)] [52:Large (67-100%)] Necrotic [name] mount: [41R:Fat Layer (Subcutaneous Tissue): Yes Fat Layer (Subcutaneous Tissue): Yes Fat Layer (Subcutaneous Tissue):  Yes] Exposed Structures: [41R:Fascia: No Tendon: No Muscle: No Joint: No Bone: No Medium (34-66%)] [51:Fascia: No Tendon: No Muscle: No Joint: No Bone: No Small (1-33%)] [52:Fascia: No Tendon: No Muscle: No Joint: No Bone: No None] Epithelialization: [41R:[name]] [51:[name]] [52:[name]] Debridement: [41R:[name]] [51:[name]] [52:[name]] Tissue Debrided: [41R:[name]] [51:[name]] [52:[name]] Level: [41R:[name]] [51:[name]] [52:[name]] Debridement [name] (sq cm): [41R:rea [name]] [51:[name]] [52:[name]] Instrument: [41R:[name]] [51:[name]] [52:[name]] Bleeding: [41R:[name]] [51:[name]] [52:[name]] Hemostasis [name] chieved: [41R:[name]] [51:[name]] [52:[name]] Procedural Pain: [41R:[name]] [51:[name]] [52:[name]] Post Procedural Pain: Debridement Treatment Response: [name] [51:[name]] [52:[name]] Post Debridement Measurements L x [name] [51:[name]] [52:[name]] W x D (cm) [41R:[name]] [51:[name]] [52:[name]] Post Debridement Volume: (cm) [41R:[name]] [51:[name]] [52:[name]] Wound Number: [id number] Photos: Left, Distal Ischium Left T Great oe Left, Dorsal Foot Wound Location: Pressure Injury Blister Gradually Appeared Wounding Event: Pressure Ulcer 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 Primary Etiology: Sleep Apnea, Hypertension, Paraplegia Sleep Apnea, Hypertension, Paraplegia Sleep Apnea, Hypertension, Paraplegia Comorbid History: 05/11/2021 05/26/2021 07/11/2021 Date Acquired: '12 10 2 '$ Weeks of Treatment: Open Open Open Wound Status: No No No Wound Recurrence: No No No Clustered Wound: [name] [name] [name] Clustered Quantity: 1x0.5x0.1 0.6x1x0.1 3.7x3.5x0.2 Measurements L x W x D (cm) 0.393 0.471 10.171 [name] (cm) : rea 0.039 0.047 2.034 Volume (cm) : 72.20% 40.00% -63.50% % Reduction in [name] rea: 72.30% 40.50% -227.00% % Reduction in Volume: Category/Stage III Full Thickness With Exposed Support Full Thickness Without Exposed Classification: Structures Support Structures Medium Medium Medium Exudate [name] mount: Sanguinous Serosanguineous Purulent Exudate Type: red red, brown yellow,  brown, green Exudate Color: Distinct, outline attached Distinct, outline attached Distinct, outline attached Wound Margin: Large (67-100%) Medium (34-66%) Small (1-33%) Granulation [name] mount: Red, Friable Pink Red Granulation Quality: None Present (0%) Medium (34-66%) Large (67-100%) Necrotic [name] mount: Fat Layer (Subcutaneous Tissue): Yes Fat Layer (Subcutaneous Tissue): Yes Fat Layer (Subcutaneous Tissue): Yes Exposed Structures: Fascia: No Bone: Yes Fascia: No Tendon: No Fascia: No Tendon: No Muscle: No Tendon: No Muscle: No Joint: No Muscle: No Joint: No Bone: No Joint: No Bone: No None None None Epithelialization: [name] [name] Debridement - Excisional Debridement: Pre-procedure Verification/Time Out [name] [name] 09:02 Taken: [name] [name] Subcutaneous, Slough Tissue Debrided: [name] [name] Skin/Subcutaneous Tissue Level: [name] [name] 12.95 Debridement [name] (sq cm): rea [name] [name] Curette Instrument: [name] [name] Minimum Bleeding: [name] [name] Pressure Hemostasis [name] chieved: [name] [name] 0 Procedural Pain: [name] [name] 0 Post Procedural Pain: [name] [name] Procedure was tolerated well Debridement Treatment Response: [name] [name] 3.7x3.5x0.2 Post Debridement Measurements L x W x D (cm) [name] [name] 2.034 Post Debridement Volume: (cm) [name] [name] Debridement Procedures Performed: Treatment Notes Electronic Signature(s) Signed: 08/04/2021 5:15:03 PM By: Rhae Hammock RN Signed: 08/05/2021 3:40:47 PM By: Linton Ham MD Entered By: Linton Ham on 08/04/2021 09:40:00 -------------------------------------------------------------------------------- Multi-Disciplinary Care Plan Details Patient Name: Date of Service:  [name], [name] 08/04/2021 8:00 [name] Medical Record Number: [id number] Patient Account Number: [id number] Date of Birth/Sex: Treating RN: November 21, 1986 (35 y.o. [name] Primary Care Shaynna Husby: [name], [name] Other Clinician: Referring Norrine Ballester: Treating Conan Mcmanaway/Extender: [name]: [place] reviewed with physician Active Inactive Wound/Skin Impairment Nursing Diagnoses: Impaired tissue integrity Knowledge deficit related to ulceration/compromised skin integrity Goals: Patient/caregiver will verbalize understanding of skin care regimen Date Initiated: 01/05/2016 Target Resolution Date: 09/04/2021 Goal Status: Active Ulcer/skin breakdown will have [name] volume reduction of 30% by week 4 Date Initiated: 01/05/2016 Date Inactivated: 12/22/2017 Target Resolution Date: 01/19/2018 Unmet Reason: complex wounds, Goal Status: Unmet infection Interventions: Assess patient/caregiver ability to obtain necessary supplies Assess ulceration(s) every visit Provide education on ulcer and skin care Notes: 02/02/21: Complex Care, ongoing. Electronic Signature(s) Signed: 08/04/2021 5:46:57 PM By: Levan Hurst RN, BSN Entered By: Levan Hurst on 08/04/2021 08:27:35 -------------------------------------------------------------------------------- Pain Assessment Details Patient Name: Date of Service: [name], [name] 08/04/2021 8:00 [name] Medical Record Number: [id number] Patient Account Number: [id number] Date of Birth/Sex: Treating RN: 17-Feb-1986 (35 y.o. [name] Primary Care Adessa Primiano: [place], [place] Other Clinician: Referring Ramiyah Mcclenahan: Treating Syniyah Bourne/Extender: [name]: 291 Active Problems Location of Pain Severity and Description of Pain Patient Has Paino No Site Locations Pain Management and Medication Current Pain Management: Electronic Signature(s) Signed: 08/04/2021 10:48:45 AM By: Sandre Kitty Signed: 08/04/2021 5:15:03 PM By: Rhae Hammock RN Entered By: Sandre Kitty on 08/04/2021 08:11:28 -------------------------------------------------------------------------------- Patient/Caregiver Education Details Patient Name: Date of Service: [name], [name]  9/13/2022andnbsp8:00 [name] Medical Record Number: [id number] Patient Account Number: [id number] Date of Birth/Gender: Treating RN: 1986-10-30 (35 y.o. [name] Primary Care Physician: Janine Limbo Other Clinician: Referring Physician: Treating Physician/Extender: [name]: 19 Education Assessment Education Provided To: Patient Education Topics Provided Wound/Skin Impairment: Methods: Explain/Verbal Responses: State content correctly Electronic Signature(s) Signed: 08/04/2021 5:46:57 PM By: Levan Hurst RN, BSN Entered By: Levan Hurst on 08/04/2021 08:28:06 -------------------------------------------------------------------------------- Wound Assessment Details Patient Name: Date of Service: [name], [name] 08/04/2021 8:00 [name] Medical Record Number: [id number] Patient Account Number: [id number] Date of Birth/Sex: Treating RN: 1986/11/09 (35 y.o. [name] Primary Care Dayanis Bergquist: [name], [name] Other Clinician: Referring Rossanna Spitzley: Treating Chanze Teagle/Extender: [name]: 291 Wound Status Wound Number: [id number] Primary Etiology: Pressure Ulcer Wound Location: Left Ischium Wound Status: Open Wounding Event: Gradually Appeared Comorbid History: Sleep Apnea, Hypertension, Paraplegia Date Acquired: 03/16/2020 Weeks Of Treatment: 72 Clustered Wound: Yes Photos Wound Measurements Length: (cm) 1.2 Width: (cm) 1 Depth: (cm) 0.1 Clustered Quantity: 1 Area: (cm) 0.942 Volume: (cm) 0.094 % Reduction in Area: 47.8% % Reduction in Volume: 48.1% Epithelialization: Medium (34-66%) Tunneling: No Undermining: No Wound Description Classification: Category/Stage II Wound Margin: Distinct, outline attached Exudate Amount: Medium Exudate Type: Sanguinous Exudate Color: red Foul Odor After Cleansing: No Slough/Fibrino No Wound Bed Granulation Amount: Large (67-100%) Exposed  Structure Granulation Quality: Red, Pink, Friable Fascia Exposed: No Necrotic Amount: None Present (0%) Fat Layer (Subcutaneous Tissue) Exposed: Yes Tendon Exposed: No Muscle Exposed: No Joint Exposed: No Bone Exposed: No Treatment Notes Wound #41R (Ischium) Wound Laterality: Left Cleanser Soap and Water Discharge Instruction: May shower and wash wound with dial antibacterial soap and water prior to dressing change. Peri-Wound Care Triad Hydrophilic Wound Dressing Tube, 6 (oz) Discharge Instruction: Apply to periwound with each dressing change Lotrisone Discharge Instruction: thin layer under  triad cream. Topical Primary Dressing KerraCel Ag Gelling Fiber Dressing, 4x5 in (silver alginate) Discharge Instruction: Apply silver alginate to wound bed as instructed Secondary Dressing ComfortFoam Border, 4x4 in (silicone border) Discharge Instruction: Apply over primary dressing as directed. Secured With Compression Wrap Compression Stockings Environmental education officer) Signed: 08/04/2021 5:15:03 PM By: Rhae Hammock RN Signed: 08/04/2021 5:46:57 PM By: Levan Hurst RN, BSN Entered By: Levan Hurst on 08/04/2021 08:32:40 -------------------------------------------------------------------------------- Wound Assessment Details Patient Name: Date of Service: [name], [name] 08/04/2021 8:00 [name] Medical Record Number: [id number] Patient Account Number: [id number] Date of Birth/Sex: Treating RN: Apr 18, 1986 (35 y.o. [name] Primary Care Lindsi Bayliss: [name], [name] Other Clinician: Referring Mardi Cannady: Treating Dyrell Tuccillo/Extender: [name]: 291 Wound Status Wound Number: 51 Primary Etiology: Trauma, Other Wound Location: Left, Anterior Lower Leg Wound Status: Open Wounding Event: Trauma Comorbid History: Sleep Apnea, Hypertension, Paraplegia Date Acquired: 01/26/2021 Weeks Of Treatment: 24 Clustered Wound:  No Photos Wound Measurements Length: (cm) 1 Width: (cm) 0.5 Depth: (cm) 0.1 Area: (cm) 0.393 Volume: (cm) 0.039 % Reduction in Area: -19.1% % Reduction in Volume: -18.2% Epithelialization: Small (1-33%) Tunneling: No Undermining: No Wound Description Classification: Full Thickness Without Exposed Support Structures Wound Margin: Distinct, outline attached Exudate Amount: Medium Exudate Type: Serosanguineous Exudate Color: red, brown Foul Odor After Cleansing: No Slough/Fibrino No Wound Bed Granulation Amount: Large (67-100%) Exposed Structure Granulation Quality: Red, Hyper-granulation, Friable Fascia Exposed: No Necrotic Amount: None Present (0%) Fat Layer (Subcutaneous Tissue) Exposed: Yes Tendon Exposed: No Muscle Exposed: No Joint Exposed: No Bone Exposed: No Treatment Notes Wound #51 (Lower Leg) Wound Laterality: Left, Anterior Cleanser Soap and Water Discharge Instruction: May shower and wash wound with dial antibacterial soap and water prior to dressing change. Peri-Wound Care Topical Primary Dressing KerraCel Ag Gelling Fiber Dressing, 4x5 in (silver alginate) Discharge Instruction: Apply silver alginate to wound bed as instructed Secondary Dressing ComfortFoam Border, 4x4 in (silicone border) Discharge Instruction: Apply over primary dressing as directed. Secured With Compression Wrap Compression Stockings Environmental education officer) Signed: 08/04/2021 4:27:12 PM By: Maye Hides Signed: 08/04/2021 5:15:03 PM By: Rhae Hammock RN Entered By: Maye Hides on 08/04/2021 16:27:10 -------------------------------------------------------------------------------- Wound Assessment Details Patient Name: Date of Service: [name], [name] 08/04/2021 8:00 [name] Medical Record Number: [id number] Patient Account Number: [id number] Date of Birth/Sex: Treating RN: 05/01/1986 (35 y.o. [name] Primary Care Mersadie Kavanaugh: [name], [name] Other  Clinician: Referring Keniya Schlotterbeck: Treating Ferris Tally/Extender: [name]: 291 Wound Status Wound Number: 52 Primary Etiology: Trauma, Other Wound Location: Right, Dorsal Foot Wound Status: Open Wounding Event: Gradually Appeared Comorbid History: Sleep Apnea, Hypertension, Paraplegia Date Acquired: 03/27/2021 Weeks Of Treatment: 18 Clustered Wound: No Photos Wound Measurements Length: (cm) 2 Width: (cm) 4.1 Depth: (cm) 0.1 Area: (cm) 6.44 Volume: (cm) 0.644 % Reduction in Area: -241.6% % Reduction in Volume: -242.6% Epithelialization: None Tunneling: No Undermining: No Wound Description Classification: Full Thickness Without Exposed Support Structures Wound Margin: Distinct, outline attached Exudate Amount: Medium Exudate Type: Serosanguineous Exudate Color: red, brown Foul Odor After Cleansing: No Slough/Fibrino Yes Wound Bed Granulation Amount: Small (1-33%) Exposed Structure Granulation Quality: Pink Fascia Exposed: No Necrotic Amount: Large (67-100%) Fat Layer (Subcutaneous Tissue) Exposed: Yes Necrotic Quality: Adherent Slough Tendon Exposed: No Muscle Exposed: No Joint Exposed: No Bone Exposed: No Treatment Notes Wound #52 (Foot) Wound Laterality: Dorsal, Right Cleanser Soap and Water Discharge Instruction: May shower and wash wound with dial antibacterial soap and water prior to dressing  change. Peri-Wound Care Topical Primary Dressing KerraCel Ag Gelling Fiber Dressing, 4x5 in (silver alginate) Discharge Instruction: Apply silver alginate to wound bed as instructed Secondary Dressing Woven Gauze Sponge, Non-Sterile 4x4 in Discharge Instruction: Apply over primary dressing as directed. Secured With [employer], 4.5x3.1 (in/yd) Discharge Instruction: Secure with Kerlix as directed. Transpore Surgical Tape, 2x10 (in/yd) Discharge Instruction: Secure dressing with tape as directed. Compression  Wrap Compression Stockings Add-Ons Electronic Signature(s) Signed: 08/04/2021 5:15:03 PM By: Rhae Hammock RN Signed: 08/04/2021 5:46:57 PM By: Levan Hurst RN, BSN Entered By: Levan Hurst on 08/04/2021 09:25:59 -------------------------------------------------------------------------------- Wound Assessment Details Patient Name: Date of Service: [name], [name] 08/04/2021 8:00 [name] Medical Record Number: [id number] Patient Account Number: [id number] Date of Birth/Sex: Treating RN: Jan 23, 1986 (35 y.o. [name] Primary Care Nilo Fallin: [name], [name] Other Clinician: Referring Sequoya Hogsett: Treating Jaimen Melone/Extender: [name]: 291 Wound Status Wound Number: 54 Primary Etiology: Pressure Ulcer Wound Location: Left, Distal Ischium Wound Status: Open Wounding Event: Pressure Injury Comorbid History: Sleep Apnea, Hypertension, Paraplegia Date Acquired: 05/11/2021 Weeks Of Treatment: 12 Clustered Wound: No Photos Wound Measurements Length: (cm) 1 Width: (cm) 0.5 Depth: (cm) 0.1 Area: (cm) 0.393 Volume: (cm) 0.039 % Reduction in Area: 72.2% % Reduction in Volume: 72.3% Epithelialization: None Wound Description Classification: Category/Stage III Wound Margin: Distinct, outline attached Exudate Amount: Medium Exudate Type: Sanguinous Exudate Color: red Foul Odor After Cleansing: No Slough/Fibrino No Wound Bed Granulation Amount: Large (67-100%) Exposed Structure Granulation Quality: Red, Friable Fascia Exposed: No Necrotic Amount: None Present (0%) Fat Layer (Subcutaneous Tissue) Exposed: Yes Tendon Exposed: No Muscle Exposed: No Joint Exposed: No Bone Exposed: No Treatment Notes Wound #54 (Ischium) Wound Laterality: Left, Distal Cleanser Soap and Water Discharge Instruction: May shower and wash wound with dial antibacterial soap and water prior to dressing change. Peri-Wound Care Triad Hydrophilic Wound  Dressing Tube, 6 (oz) Discharge Instruction: Apply to periwound with each dressing change Lotrisone Discharge Instruction: thin layer under triad cream. Topical Primary Dressing KerraCel Ag Gelling Fiber Dressing, 4x5 in (silver alginate) Discharge Instruction: Apply silver alginate to wound bed as instructed Secondary Dressing ComfortFoam Border, 4x4 in (silicone border) Discharge Instruction: Apply over primary dressing as directed. Secured With Compression Wrap Compression Stockings Add-Ons Electronic Signature(s) Signed: 08/04/2021 10:48:45 AM By: Sandre Kitty Signed: 08/04/2021 5:15:03 PM By: Rhae Hammock RN Entered By: Sandre Kitty on 08/04/2021 08:27:38 -------------------------------------------------------------------------------- Wound Assessment Details Patient Name: Date of Service: [place], [name] 08/04/2021 8:00 [name] Medical Record Number: [id number] Patient Account Number: [id number] Date of Birth/Sex: Treating RN: 1986-10-26 (35 y.o. [name] Primary Care Emonee Winkowski: [name], [name] Other Clinician: Referring Neidra Girvan: Treating Vora Clover/Extender: [name]: 291 Wound Status Wound Number: 55 Primary Etiology: Neuropathic Ulcer-Non Diabetic Wound Location: Left T Great oe Wound Status: Open Wounding Event: Blister Comorbid History: Sleep Apnea, Hypertension, Paraplegia Date Acquired: 05/26/2021 Weeks Of Treatment: 10 Clustered Wound: No Photos Wound Measurements Length: (cm) 0.6 Width: (cm) 1 Depth: (cm) 0.1 Area: (cm) 0.471 Volume: (cm) 0.047 % Reduction in Area: 40% % Reduction in Volume: 40.5% Epithelialization: None Tunneling: No Undermining: No Wound Description Classification: Full Thickness With Exposed Support Structures Wound Margin: Distinct, outline attached Exudate Amount: Medium Exudate Type: Serosanguineous Exudate Color: red, brown Foul Odor After Cleansing:  No Slough/Fibrino Yes Wound Bed Granulation Amount: Medium (34-66%) Exposed Structure Granulation Quality: Pink Fascia Exposed: No Necrotic Amount: Medium (34-66%) Fat Layer (Subcutaneous Tissue) Exposed: Yes Necrotic Quality: Adherent Slough Tendon  Exposed: No Muscle Exposed: No Joint Exposed: No Bone Exposed: Yes Treatment Notes Wound #55 (Toe Great) Wound Laterality: Left Cleanser Soap and Water Discharge Instruction: May shower and wash wound with dial antibacterial soap and water prior to dressing change. Peri-Wound Care Topical Primary Dressing KerraCel Ag Gelling Fiber Dressing, 4x5 in (silver alginate) Discharge Instruction: Apply silver alginate to wound bed as instructed Secondary Dressing Woven Gauze Sponge, Non-Sterile 4x4 in Discharge Instruction: Apply over primary dressing as directed. Secured With [employer], 4.5x3.1 (in/yd) Discharge Instruction: Secure with Kerlix as directed. Transpore Surgical Tape, 2x10 (in/yd) Discharge Instruction: Secure dressing with tape as directed. Compression Wrap Compression Stockings Add-Ons Electronic Signature(s) Signed: 08/04/2021 5:15:03 PM By: Rhae Hammock RN Signed: 08/04/2021 5:46:57 PM By: Levan Hurst RN, BSN Entered By: Levan Hurst on 08/04/2021 08:35:08 -------------------------------------------------------------------------------- Wound Assessment Details Patient Name: Date of Service: Hurston, [name] 08/04/2021 8:00 [name] Medical Record Number: [id number] Patient Account Number: [id number] Date of Birth/Sex: Treating RN: Mar 08, 1986 (35 y.o. [name] Primary Care Law Corsino: [name], [name] Other Clinician: Referring Sherrine Salberg: Treating Malayjah Otoole/Extender: [name]: 291 Wound Status Wound Number: [id number] Primary Etiology: Neuropathic Ulcer-Non Diabetic Wound Location: Left, Dorsal Foot Wound Status: Open Wounding Event: Gradually  Appeared Comorbid History: Sleep Apnea, Hypertension, Paraplegia Date Acquired: 07/11/2021 Weeks Of Treatment: 2 Clustered Wound: No Photos Wound Measurements Length: (cm) 3.7 Width: (cm) 3.5 Depth: (cm) 0.2 Area: (cm) 10.171 Volume: (cm) 2.034 % Reduction in Area: -63.5% % Reduction in Volume: -227% Epithelialization: None Wound Description Classification: Full Thickness Without Exposed Support Structures Wound Margin: Distinct, outline attached Exudate Amount: Medium Exudate Type: Purulent Exudate Color: yellow, brown, green Foul Odor After Cleansing: No Slough/Fibrino Yes Wound Bed Granulation Amount: Small (1-33%) Exposed Structure Granulation Quality: Red Fascia Exposed: No Necrotic Amount: Large (67-100%) Fat Layer (Subcutaneous Tissue) Exposed: Yes Necrotic Quality: Adherent Slough Tendon Exposed: No Muscle Exposed: No Joint Exposed: No Bone Exposed: No Electronic Signature(s) Signed: 08/04/2021 5:15:03 PM By: Rhae Hammock RN Signed: 08/04/2021 5:46:57 PM By: Levan Hurst RN, BSN Entered By: Levan Hurst on 08/04/2021 09:17:00 -------------------------------------------------------------------------------- Vitals Details Patient Name: Date of Service: Harren, [name] 08/04/2021 8:00 [name] Medical Record Number: [id number] Patient Account Number: [id number] Date of Birth/Sex: Treating RN: 02/18/1986 (35 y.o. [name] Primary Care Nakai Pollio: [place], [place] Other Clinician: Referring Daelen Belvedere: Treating Marijose Curington/Extender: [name]: 291 Vital Signs Time Taken: 08:10 Temperature (F): 98.4 Height (in): 70 Pulse (bpm): 114 Weight (lbs): 216 Respiratory Rate (breaths/min): 18 Body Mass Index (BMI): 31 Blood Pressure (mmHg): 129/62 Reference Range: 80 - 120 mg / dl Electronic Signature(s) Signed: 08/04/2021 10:48:45 AM By: Sandre Kitty Entered By: Sandre Kitty on 08/04/2021 08:11:21

## 2021-08-05 NOTE — Progress Notes (Signed)
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 edges Small open wounds remaining on the  dorsal right first and second toe interspace and the plantar left first second toe and her face on the left 06/09/17; wound on the left lateral leg continues to be smaller but very gradual and very dry surface using Hydrofera Blue 06/16/17 requires weekly debridements now on the left lateral leg although this continues to contract. I changed to silver collagen last week because of dryness of the wound bed. Using Iodoflex to the areas on his first and second toes/web space bilaterally 06/24/17; patient with history of paraplegia also chronic venous insufficiency with lymphedema. Has [name] very difficult wound on the left lateral leg. This has been gradually reducing in terms of with but comes in with [name] very dry adherent surface. High switch to silver collagen [name] week or so ago with hydrogel to keep the area moist. This is been refractory to multiple dressing attempts. He also has areas in his first and second toes bilaterally in the anterior and posterior web space. I had been using Iodoflex here after [name] prolonged course of silver alginate with ketoconazole was ineffective [question tinea pedis] 07/14/17; patient arrives today with [name] very difficult adherent material over his left lateral lower leg wound. He also has surrounding erythema and poorly controlled edema. He was switched his Santyl last visit which the nurses are applying once during his doctor visit and once on [name] nurse visit. He was also reduced to 2 layer compression I'm not exactly sure of the issue here. 07/21/17; better surface today after 1 week of Iodoflex. Significant cellulitis that we treated last week also better. [Doxycycline] 07/28/17 better surface today with now 2 weeks of Iodoflex. Significant cellulitis treated  with doxycycline. He has now completed the doxycycline and he is back to his usual degree of chronic venous inflammation/stasis dermatitis. He reminds me he has had ablations surgery here 08/04/17; continued improvement with Iodoflex  to the left lateral leg wound in terms of the surface of the wound although the dimensions are better. 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 Problematic areas on the plantar aspect of the first second toe web space on the left and the dorsal aspect of the first second toe web space on the right. At one point I felt these were probably related to chronic fungal infections in treated him aggressively for this although we have not made any improvement here. 08/11/17; left lateral leg. Surface continues to improve with the Iodoflex although we are not seeing much improvement in overall wound dimensions. Areas on his plantar left foot and right foot show no improvement. In fact the right foot looks somewhat worse 08/18/17; left lateral leg. We changed to [facility] Blue last week after [name] prolonged course of Iodoflex which helps get the surface better. It appears that the wound with is improved. Continue with difficult areas on the left dorsal first second and plantar first second on the right 09/01/17; patient arrives in clinic today having had [name] temperature of 103 yesterday. He was seen in the ER and [facility]. The patient was concerned he could have cellulitis again in the right leg however they diagnosed him with [name] UTI and he is now on Keflex. He has [name] history of cellulitis which is been recurrent and difficult but this is been in the left leg, in the past 5 use doxycycline. He does in and out catheterizations at home which are risk factors for UTI 09/08/17; patient will be completing his Keflex this weekend. The erythema on the left leg is considerably better. He has [name] new wound today on the medial part of the right leg small superficial almost looks like [name] skin tear. He has worsening of the area on the right dorsal first and second toe. His major area on the left lateral leg is better. Using Hydrofera Blue on all areas 09/15/17;  gradual reduction in width on the long wound in the left lateral leg. No debridement required. He also has wounds on the plantar aspect of his left first second toe web space and on the dorsal aspect of the right first second toe web space. 09/22/17; there continues to be very gradual improvements in the dimensions of the left lateral leg wound. He hasn't round erythematous spot with might be pressure on his wheelchair. There is no evidence obviously of infection no purulence no warmth He has [name] dry scaled area on the plantar aspect of the left first second toe Improved area on the dorsal right first second toe. 09/29/17; left lateral leg wound continues to improve in dimensions mostly with an is still [name] fairly long but increasingly narrow wound. He has [name] dry scaled area on the plantar aspect of his left first second toe web space Increasingly concerning area on the dorsal right first second toe. In fact I am concerned today about possible cellulitis around this wound. The areas extending up his second toe and although there is deformities here almost appears to abut on the nailbed. 10/06/17; left lateral leg wound continues to make very gradual progress. Tissue culture I did from the right first second toe dorsal foot last time grew MRSA and enterococcus which  was vancomycin sensitive. This was not sensitive to clindamycin or doxycycline. He is allergic to Zyvox and sulfa we have therefore arrange for him to have dalvance infusion tomorrow. He is had this in the past and tolerated it well 10/20/17; left lateral leg wound continues to make decent progress. This is certainly reduced in terms of with there is advancing epithelialization.The cellulitis in the right foot looks better although he still has [name] deep wound in the dorsal aspect of the first second toe web space. Plantar left first toe web space on the left I think is making some progress 10/27/17; left lateral leg wound continues to make decent  progress. Advancing epithelialization.using Hydrofera Blue The right first second toe web space wound is better-looking using silver alginate Improvement in the left plantar first second toe web space. Again using silver alginate 11/03/17 left lateral leg wound continues to make decent progress albeit slowly. Using [facility] The right per second toe web space continues to be [name] very problematic looking punched out wound. I obtained [name] piece of tissue for deep culture I did extensively treated this for fungus. It is difficult to imagine that this is [name] pressure area as the patient states other than going outside he doesn't really wear shoes at home The left plantar first second toe web space looked fairly senescent. Necrotic edges. This required debridement change to [facility] Blue to all wound areas 11/10/17; left lateral leg wound continues to contract. Using Hydrofera Blue On the right dorsal first second toe web space dorsally. Culture I did of this area last week grew MRSA there is not an easy oral option in this patient was multiple antibiotic allergies or intolerances. This was only [name] rare culture isolate I'm therefore going to use Bactroban under silver alginate On the left plantar first second toe web space. Debridement is required here. This is also unchanged 11/17/17; left lateral leg wound continues to contract using Hydrofera Blue this is no longer the major issue. The major concern here is the right first second toe web space. He now has an open area going from dorsally to the plantar aspect. There is now wound on the inner lateral part of the first toe. Not [name] very viable surface on this. There is erythema spreading medially into the forefoot. No major change in the left first second toe plantar wound 11/24/17; left lateral leg wound continues to contract using Hydrofera Blue. Nice improvement today The right first second toe web space all of this looks [name] lot less angry than last week.  I have given him clindamycin and topical Bactroban for MRSA and terbinafine for the possibility of underlining tinea pedis that I could not control with ketoconazole. Looks somewhat better The area on the plantar left first second toe web space is weeping with dried debris around the wound 12/01/17; left lateral leg wound continues to contract he Hydrofera Blue. It is becoming thinner in terms of with nevertheless it is making good improvement. The right first second toe web space looks less angry but still [name] large necrotic-looking wounds starting on the plantar aspect of the right foot extending between the toes and now extensively on the base of the right second toe. I gave him clindamycin and topical Bactroban for MRSA anterior benefiting for the possibility of underlying tinea pedis. Not looking better today The area on the left first/second toe looks better. Debrided of necrotic debris 12/05/17* the patient was worked in urgently today because over the weekend he found blood  on his incontinence bad when he woke up. He was found to have an ulcer by his wife who does most of his wound care. He came in today for [place] to look at this. He has not had [name] history of wounds in his buttocks in spite of his paraplegia. 12/08/17; seen in follow-up today at his usual appointment. He was seen earlier this week and found to have [name] new wound on his buttock. We also follow him for wounds on the left lateral leg, left first second toe web space and right first second toe web space 12/15/17; we have been using Hydrofera Blue to the left lateral leg which has improved. The right first second toe web space has also improved. Left first second toe web space plantar aspect looks stable. The left buttock has worsened using Santyl. Apparently the buttock has drainage 12/22/17; we have been using Hydrofera Blue to the left lateral leg which continues to improve now 2 small wounds separated by normal skin. He tells [place] he had [name]  fever up to 100 yesterday he is prone to UTIs but has not noted anything different. He does in and out catheterizations. The area between the first and second toes today does not look good necrotic surface covered with what looks to be purulent drainage and erythema extending into the third toe. I had gotten this to something that I thought look better last time however it is not look good today. He also has [name] necrotic surface over the buttock wound which is expanded. I thought there might be infection under here so I removed [name] lot of the surface with [name] #5 curet though nothing look like it really needed culturing. He is been using Santyl to this area 12/27/17; his original wound on the left lateral leg continues to improve using Hydrofera Blue. I gave him samples of Baxdella although he was unable to take them out of fear for an allergic reaction ["lump in his throat"].the culture I did of the purulent drainage from his second toe last week showed both enterococcus and [name] set Enterobacter I was also concerned about the erythema on the bottom of his foot although paradoxically although this looks somewhat better today. 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 We've been using silver alginate here. They came up today that he sleeps in [name] recliner, I'm not sure why but I asked him to stop this 01/03/18; his original wound we've been using Hydrofera Blue is now separated into 2 areas. Ulcer on his left buttock is better he is off the recliner and sleeping in bed Finally both wound areas between his first and second toes also looks some better 01/10/18; his original wound on the left lateral leg is now separated into 2 wounds we've been using Hydrofera Blue Ulcer on his left buttock has some drainage. There is [name] small probing site going into muscle layer superiorly.using silver alginate -He arrives today with [name] deep  tissue injury on the left heel The wound on the dorsal aspect of his first second toe on the left looks [name] lot betterusing silver alginate ketoconazole The area on the first second toe web space on the right also looks [name] lot bette 01/17/18; his original wound on the left lateral leg continues to progress using Hydrofera Blue Ulcer on his left buttock also is smaller surface healthier except for [name] small probing site going into the muscle  layer superiorly. 2.4 cm of tunneling in this area DTI on his left heel we have only been offloading. Looks better than last week no threatened open no evidence of infection the wound on the dorsal aspect of the first second toe on the left continues to look like it's regressing we have only been using silver alginate and terbinafine orally The area in the first second toe web space on the right also looks to be [name] lot better using silver alginate and terbinafine I think this was prompted by tinea pedis 01/31/18; the patient was hospitalized in [place] last week apparently for [name] complicated UTI. He was discharged on cefepime he does in and out catheterizations. In the hospital he was discovered M I don't mild elevation of AST and ALT and the terbinafine was stopped.predictably the pressure ulcer on s his buttock looks betterusing silver alginate. The area on the left lateral leg also is better using Hydrofera Blue. The area between the first and second toes on the left better. First and second toes on the right still substantial but better. Finally the DTI on the left heel has held together and looks like it's resolving 02/07/18-he is here in follow-up evaluation for multiple ulcerations. He has new injury to the lateral aspect of the last issue [name] pressure ulcer, he states this is from adhesive removal trauma. He states he has tried multiple adhesive products with no success. All other ulcers appear stable. The left heel DTI is resolving. We will continue with same  treatment plan and follow-up next week. 02/14/18; follow-up for multiple areas. He has [name] new area last week on the lateral aspect of his pressure ulcer more over the posterior trochanter. The original pressure ulcer looks quite stable has healthy granulation. 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 Almost fully epithelialized on the original superior area using Hydrofera Blue DTI on the left heel has peeled off this week to reveal [name] small superficial wound under denuded skin and subcutaneous tissue Both areas between the first and second toes look better including nothing open on the left 02/21/18; The patient's wounds on his left ischial tuberosity and posterior left greater trochanter actually looked better. He has [name] large area of irritation around the area which I think is contact dermatitis. I am doubtful that this is fungal His original wound on the left lateral calf continues to improve we have been using Hydrofera Blue There is no open area in the left first second toe web space although there is [name] lot of thick callus The DTI on the left heel required debridement today of necrotic surface eschar and subcutaneous tissue using silver alginate Finally the area on the right first second toe webspace continues to contract using silver alginate and ketoconazole 02/28/18 Left ischial tuberosity wounds look better using silver alginate. Original wound on the left calf only has one small open area left using Hydrofera Blue DTI on the left heel required debridement mostly removing skin from around this wound surface. Using silver alginate The areas on the right first/second toe web space using silver alginate and ketoconazole 03/08/18 on evaluation today patient appears to be doing decently well as best I can tell in regard to his wounds. This is the first time that I have seen him as he generally  is followed by Dr. Dellia Nims. With that being said none of his wounds appear to be infected he does  have an area where there is some skin covering what appears to be [name] new wound on the left dorsal surface of his great toe. This is right at the nail bed. With that being said I do believe that debrided away some of the excess skin can be of benefit in this regard. Otherwise he has been tolerating the dressing changes without complication. 03/14/18; patient arrives today with the multiplicity of wounds that we are following. He has not been systemically unwell Original wound on the left lateral calf now only has 2 small open areas we've been using Hydrofera Blue which should continue The deep tissue injury on the left heel requires debridement today. We've been using silver alginate The left first second toe and the right first second toe are both are reminiscence what I think was tinea pedis. Apparently some of the callus Surface between the toes was removed last week when it started draining. Purulent drainage coming from the wound on the ischial tuberosity on the left. 03/21/18-He is here in follow-up evaluation for multiple wounds. 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 He tolerated debridement. The only change to last week's recommendations is to discontinue antifungal cream between toes. He will follow-up next week 03/28/18; following up for multiple wounds;Concern this week is streaking redness and swelling in the right foot. He is going to need antibiotics for this. 03/31/18; follow-up for right foot cellulitis. Streaking redness and swelling in the right foot on 03/28/18. He has multiple antibiotic intolerances and [name] history of MRSA. I put him on clindamycin 300 mg every 6 and brought him in for [name] quick check. He has an open wound between his first and second toes on the right foot as [name] potential source. 04/04/18; Right foot  cellulitis is resolving he is completing clindamycin. This is truly good news Left lateral calf wound which is initial wound only has one small open area inferiorly this is close to healing out. He has compression stockings. We will use Hydrofera Blue right down to the epithelialization of this Nonviable surface on the left heel which was initially pressure with [name] DTI. We've been using Hydrofera Blue. I'm going to switch this back to silver alginate Left first second toe/tinea pedis this looks better using silver alginate Right first second toe tinea pedis using silver alginate Large pressure ulcers on theLeft ischial tuberosity. Small wound here Looks better. I am uncertain about the surface over the large wound. Using silver alginate 04/11/18; Cellulitis in the right foot is resolved Left lateral calf wound which was his original wounds still has 2 tiny open areas remaining this is just about closed Nonviable surface on the left heel is better but still requires debridement Left first second toe/tinea pedis still open using silver alginate Right first second toe wound tinea pedis I asked him to go back to using ketoconazole and silver alginate Large pressure ulcers on the left ischial tuberosity this shear injury here is resolved. Wound is smaller. No evidence of infection using silver alginate 04/18/18; Patient arrives with an intense area of cellulitis in the right mid lower calf extending into the right heel area. Bright red and warm. Smaller area on the left anterior leg. He has [name] significant history of MRSA. He will definitely need antibioticsdoxycycline He now has 2 open areas on the left ischial tuberosity the original large wound and now [name] satellite area which I think was above his initial satellite areas. Not [name]  wonderful surface on this satellite area surrounding erythema which looks like pressure related. His left lateral calf wound again his original wound is just about closed Left  heel pressure injury still requiring debridement Left first second toe looks [name] lot better using silver alginate Right first second toe also using silver alginate and ketoconazole cream also looks better 04/20/18; the patient was worked in early today out of concerns with his cellulitis on the right leg. I had started him on doxycycline. This was 2 days ago. His wife was concerned about the swelling in the area. Also concerned about the left buttock. He has not been systemically unwell no fever chills. No nausea vomiting or diarrhea 04/25/18; the patient's left buttock wound is continued to deteriorate he is using Hydrofera Blue. He is still completing clindamycin for the cellulitis on the right leg although all of this looks better. 05/02/18 Left buttock wound still with [name] lot of drainage and [name] very tightly adherent fibrinous necrotic surface. He has [name] deeper area superiorly The left lateral calf wound is still closed DTI wound on the left heel necrotic surface especially the circumference using Iodoflex Areas between his left first second toe and right first second toe both look better. Dorsally and the right first second toe he had [name] necrotic surface although at smaller. In using silver alginate and ketoconazole. I did [name] culture last week which was [name] deep tissue culture of the reminiscence of the open wound on the right first second toe dorsally. This grew [name] few Acinetobacter and [name] few methicillin-resistant staph aureus. Nevertheless the area actually this week looked better. I didn't feel the need to specifically address this at least in terms of systemic antibiotics. 05/09/18; wounds are measuring larger more drainage per our intake. We are using Santyl covered with alginate on the large superficial buttock wounds, Iodosorb on the left heel, ketoconazole and silver alginate to the dorsal first and second toes bilaterally. 05/16/18; The area on his left buttock better in some aspects although the  area superiorly over the ischial tuberosity required an extensive debridement.using Santyl Left heel appears stable. Using Iodoflex The areas between his first and second toes are not bad however there is spreading erythema up the dorsal aspect of his left foot this looks like cellulitis again. He is insensate the erythema is really very brilliant.o Erysipelas He went to see an allergist days ago because he was itching part of this he had lab work done. This showed [name] white count of 15.1 with 70% neutrophils. Hemoglobin of 11.4 and [name] platelet count of 659,000. Last white count we had in Epic was [name] 2-1/2 years ago which was 25.9 but he was ill at the time. He was able to show me some lab work that was done by his primary physician the pattern is about the same. 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 But prompted me to go ahead and do x-rays of both feet and the pelvis rule out osteomyelitis. He also had [name] comprehensive metabolic panel this was reasonably normal his albumin was 3.7 liver function tests BUN/creatinine all normal 05/23/18; x-rays of both his feet from last week were negative for underlying pulmonary abnormality. The x-ray of his pelvis however showed mild irregularity in the left ischial which may represent some early osteomyelitis. The wound in the left ischial continues to get deeper clearly now exposed muscle. Each week necrotic surface material over this area. Whereas the rest  of the wounds do not look so bad. The left ischial wound we have been using Santyl and calcium alginate T the left heel surface necrotic debris using Iodoflex o The left lateral leg is still healed Areas on the left dorsal foot and the right dorsal foot are about the same. There is some inflammation on the left which might represent contact dermatitis, fungal dermatitis I am doubtful cellulitis although this looks better than last week 05/30/18; CT scan done at  Hospital did not show any osteomyelitis or abscess. Suggested the possibility of underlying cellulitis although I don't see [name] lot of evidence of this at the bedside The wound itself on the left buttock/upper thigh actually looks somewhat better. No debridement Left heel also looks better no debridement continue Iodoflex Both dorsal first second toe spaces appear better using Lotrisone. Left still required debridement 06/06/18; Intake reported some purulent looking drainage from the left gluteal wound. Using Santyl and calcium alginate Left heel looks better although still [name] nonviable surface requiring debridement The left dorsal foot first/second webspace actually expanding and somewhat deeper. I may consider doing [name] shave biopsy of this area Right dorsal foot first/second webspace appears stable to improved. Using Lotrisone and silver alginate to both these areas 06/13/18 Left gluteal surface looks better. Now separated in the 2 wounds. No debridement required. Still drainage. We'll continue silver alginate Left heel continues to look better with Iodoflex continue this for at least another week Of his dorsal foot wounds the area on the left still has some depth although it looks better than last week. We've been using Lotrisone and silver alginate 06/20/18 Left gluteal continues to look better healthy tissue Left heel continues to look better healthy granulation wound is smaller. He is using Iodoflex and his long as this continues continue the Iodoflex Dorsal right foot looks better unfortunately dorsal left foot does not. There is swelling and erythema of his forefoot. He had minor trauma to this several days ago but doesn't think this was enough to have caused any tissue injury. Foot looks like cellulitis, we have had this problem before 06/27/18 on evaluation today patient appears to be doing [name] little worse in regard to his foot ulcer. Unfortunately it does appear that he has  methicillin-resistant staph aureus and unfortunately there really are no oral options for him as he's allergic to sulfa drugs as well as I box. Both of which would really be his only options for treating this infection. In the past he has been given and effusion of Orbactiv. This is done very well for him in the past again it's one time dosing IV antibiotic therapy. Subsequently I do believe this is something we're gonna need to see about doing at this point in time. Currently his other wounds seem to be doing somewhat better in my pinion I'm pretty happy in that regard. 07/03/18 on evaluation today patient's wounds actually appear to be doing fairly well. He has been tolerating the dressing changes without complication. All in all he seems to be showing signs of improvement. In regard to the antibiotics he has been dealing with infectious disease since I saw him last week as far as getting this scheduled. In the end [facility]'s [facility] this coming Friday. In the meantime he has been continuing to perform the dressing changes in such as previous. There does not appear to be any evidence of infection worsengin at this time. 07/10/18;  Since I last saw this man 2 weeks ago things have actually improved. IV antibiotics of resulted in less forefoot erythema although there is still some present. He is not systemically unwell Left buttock wounds 2 now have no depth there is increased epithelialization Using silver alginate Left heel still requires debridement using Iodoflex Left dorsal foot still with [name] sizable wound about the size of [name] border but healthy granulation Right dorsal foot still with [name] slitlike area using silver alginate 07/18/18; the patient's cellulitis in the left foot is improved in fact I think it is on its way to resolving. Left buttock wounds 2 both look better although the larger one has hypertension granulation we've been using silver  alginate Left heel has some thick circumferential redundant skin over the wound edge which will need to be removed today we've been using Iodoflex Left dorsal foot is still [name] sizable wound required debridement using silver alginate The right dorsal foot is just about closed only [name] small open area remains here 07/25/18; left foot cellulitis is resolved Left buttock wounds 2 both look better. Hyper-granulation on the major area Left heel as some debris over the surface but otherwise looks [name] healthier wound. Using silver collagen Right dorsal foot is just about closed 07/31/18; arrives with our intake nurse worried about purulent drainage from the buttock. We had hyper-granulation here last week His buttock wounds 2 continue to look better Left heel some debris over the surface but measuring smaller. Right dorsal foot unfortunately has openings between the toes Left foot superficial wound looks less aggravated. 08/07/18 Buttock wounds continue to look better although some of her granulation and the larger medial wound. silver alginate Left heel continues to look [name] lot better.silver collagen Left foot superficial wound looks less stable. Requires debridement. He has [name] new wound superficial area on the foot on the lateral dorsal foot. Right foot looks better using silver alginate without Lotrisone 08/14/2018; patient was in the ER last week diagnosed with [name] UTI. He is now on Cefpodoxime and Macrodantin. Buttock wounds continued to be smaller. Using silver alginate Left heel continues to look better using silver collagen Left foot superficial wound looks as though it is improving Right dorsal foot area is just about healed. 08/21/2018; patient is completed his antibiotics for his UTI. He has 2 open areas on the buttocks. There is still not closed although the surface looks satisfactory. Using silver alginate Left heel continues to improve using silver collagen The bilateral dorsal foot areas which are  at the base of his first and second toes/possible tinea pedis are actually stable on the left but worse on the right. The area on the left required debridement of necrotic surface. After debridement I obtained [name] specimen for PCR culture. The right dorsal foot which is been just about healed last week is now reopened 08/28/2018; culture done on the left dorsal foot showed coag negative staph both staph epidermidis and Lugdunensis. I think this is worthwhile initiating systemic treatment. I will use doxycycline given his long list of allergies. The area on the left heel slightly improved but still requiring debridement. The large wound on the buttock is just about closed whereas the smaller one is larger. Using silver alginate in this area 09/04/2018; patient is completing his doxycycline for the left foot although this continues to be [name] very difficult wound area with very adherent necrotic debris. We are using silver alginate to all his wounds right foot left foot and the small wounds on his  buttock, silver collagen on the left heel. 09/11/2018; once again this patient has intense erythema and swelling of the left forefoot. Lesser degrees of erythema in the right foot. He has [name] long list of allergies and intolerances. I will reinstitute doxycycline. 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 Using silver alginate Left heel also looks better using silver collagen Unfortunately both the areas on his feet look worse. The area on the left first second webspace is now gone through to the plantar part of his foot. The area on the left foot anteriorly is irritated with erythema and swelling in the forefoot. 09/25/2018 His wound on the left plantar heel looks better. Using silver collagen The area on the left buttock 2 small remnant areas. One is closed one is still open. Using silver alginate The areas between both his first and second toes look worse. This in  spite of long-standing antifungal therapy with ketoconazole and silver alginate which should have antifungal activity He has small areas around his original wound on the left calf one is on the bottom of the original scar tissue and one superiorly both of these are small and superficial but again given wound history in this site this is worrisome 10/02/2018 Left plantar heel continues to gradually contract using silver collagen Left buttock wound is unchanged using silver alginate The areas on his dorsal feet between his first and second toes bilaterally look about the same. I prescribed clindamycin ointment to see if we can address chronic staph colonization and also the underlying possibility of erythrasma The left lateral lower extremity wound is actually on the lateral part of his ankle. Small open area here. We have been using silver alginate 10/09/2018; Left plantar heel continues to look healthy and contract. No debridement is required Left buttock slightly smaller with [name] tape injury wound just below which was new this week Dorsal feet somewhat improved I have been using clindamycin Left lateral looks lower extremity the actual open area looks worse although [name] lot of this is epithelialized. I am going to change to silver collagen today He has [name] lot more swelling in the right leg although this is not pitting not red and not particularly warm there is [name] lot of spasm in the right leg usually indicative of people with paralysis of some underlying discomfort. We have reviewed his vascular status from 2017 he had [name] left greater saphenous vein ablation. I wonder about referring him back to vascular surgery if the area on the left leg continues to deteriorate. 10/16/2018 in today for follow-up and management of multiple lower extremity ulcers. His left Buttock wound is much lower smaller and almost closed completely. The wound to the left ankle has began to reopen with Epithelialization and some  adherent slough. He has multiple new areas to the left foot and leg. The left dorsal foot without much improvement. Wound present between left great webspace and 2nd toe. Erythema and edema present right leg. Right LE ultrasound obtained on 10/10/18 was negative for DVT . 10/23/2018; Left buttock is closed over. Still dry macerated skin but there is no open wound. I suspect this is chronic pressure/moisture Left lateral calf is quite [name] bit worse than when I saw this last. There is clearly drainage here he has macerated skin into the left plantar heel. We will change the primary dressing to alginate Left dorsal foot has some improvement in overall wound area. Still using clindamycin and  silver alginate Right dorsal foot about the same as the left using clindamycin and silver alginate The erythema in the right leg has resolved. He is DVT rule out was negative Left heel pressure area required debridement although the wound is smaller and the surface is health 10/26/2018 The patient came back in for his nurse check today predominantly because of the drainage coming out of the left lateral leg with [name] recent reopening of his original wound on the left lateral calf. He comes in today with [name] large amount of surrounding erythema around the wound extending from the calf into the ankle and even in the area on the dorsal foot. He is not systemically unwell. He is not febrile. Nevertheless this looks like cellulitis. We have been using silver alginate to the area. I changed him to [name] regular visit and I am going to prescribe him doxycycline. The rationale here is [name] long list of medication intolerances and [name] history of MRSA. I did not see anything that I thought would provide [name] valuable culture 10/30/2018 Follow-up from his appointment 4 days ago with really an extensive area of cellulitis in the left calf left lateral ankle and left dorsal foot. I put him on doxycycline. He has [name] long list of medication allergies  which are true allergy reactions. 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 In any case he is [name] lot better today. The erythema especially in the anterior and lateral left calf is better. He still has left ankle erythema. He also is complaining about increasing edema in the right leg we have only been using Kerlix Coban and he has been doing the wraps at home. Finally he has [name] spotty rash on the medial part of his upper left calf which looks like folliculitis or perhaps wrap occlusion type injury. Small superficial macules not pustules 11/06/18 patient arrives today with again [name] considerable degree of erythema around the wound on the left lateral calf extending into the dorsal ankle and dorsal foot. This is [name] lot worse than when I saw this last week. He is on doxycycline really with not [name] lot of improvement. He has not been systemically unwell Wounds on the; left heel actually looks improved. Original area on the left foot and proximity to the first and second toes looks about the same. He has superficial areas on the dorsal foot, anterior calf and then the reopening of his original wound on the left lateral calf which looks about the same The only area he has on the right is the dorsal webspace first and second which is smaller. He has [name] large area of dry erythematous skin on the left buttock small open area here. 11/13/2018; the patient arrives in much better condition. The erythema around the wound on the left lateral calf is [name] lot better. Not sure whether this was the clindamycin or the TCA and ketoconazole or just in the improvement in edema control [stasis dermatitis]. In any case this is [name] lot better. The area on the left heel is very small and just about resolved using silver collagen we have been using silver alginate to the areas on his dorsal feet 11/20/2018; his wounds include the left lateral calf, left heel, dorsal  aspects of both feet just proximal to the first second webspace. He is stable to slightly improved. I did not think any changes to his dressings were going to be necessary 11/27/2018 he has [name] reopening  on the left buttock which is surrounded by what looks like tinea or perhaps some other form of dermatitis. The area on the left dorsal foot has some erythema around it I have marked this area but I am not sure whether this is cellulitis or not. Left heel is not closed. Left calf the reopening is really slightly longer and probably worse 1/13; in general things look better and smaller except for the left dorsal foot. Area on the left heel is just about closed, left buttock looks better only [name] small wound remains in the skin looks better [using Lotrisone] 1/20; the area on the left heel only has [name] few remaining open areas here. Left lateral calf about the same in terms of size, left dorsal foot slightly larger right lateral foot still not closed. The area on the left buttock has no open wound and the surrounding skin looks [name] lot better 1/27; the area on the left heel is closed. Left lateral calf better but still requiring extensive debridements. The area on his left buttock is closed. He still has the open areas on the left dorsal foot which is slightly smaller in the right foot which is slightly expanded. We have been using Iodoflex on these areas as well 2/3; left heel is closed. Left lateral calf still requiring debridement using Iodoflex there is no open area on his left buttock however he has dry scaly skin over [name] large area of this. Not really responding well to the Lotrisone. Finally the areas on his dorsal feet at the level of the first second webspace are slightly smaller on the right and about the same on the left. Both of these vigorously debrided with Anasept and gauze 2/10; left heel remains closed he has dry erythematous skin over the left buttock but there is no open wound here. Left lateral  leg has come in and with. Still requiring debridement we have been using Iodoflex here. Finally the area on the left dorsal foot and right dorsal foot are really about the same extremely dry callused fissured areas. He does not yet have [name] dermatology appointment 2/17; left heel remains closed. He has [name] new open area on the left buttock. The area on the left lateral calf is bigger longer and still covered in necrotic debris. No major change in his foot areas bilaterally. I am awaiting for [name] dermatologist to look on this. We have been using ketoconazole I do not know that this is been doing any good at all. 2/24; left heel remains closed. The left buttock wound that was new reopening last week looks better. The left lateral calf appears better also although still requires debridement. The major area on his foot is the left first second also requiring debridement. We have been putting Prisma on all wounds. I do not believe that the ketoconazole has done too much good for his feet. He will use Lotrisone I am going to give him [name] 2-week course of terbinafine. We still do not have [name] dermatology appointment 3/2 left heel remains closed however there is skin over bone in this area I pointed this out to him today. The left buttock wound is epithelialized but still does not look completely stable. The area on the left leg required debridement were using silver collagen here. With regards to his feet we changed to Lotrisone last week and silver alginate. 3/9; left heel remains closed. Left buttock remains closed. The area on the right foot is essentially closed. The left foot remains  unchanged. Slightly smaller on the left lateral calf. Using silver collagen to both of these areas 3/16-Left heel remains closed. Area on right foot is closed. Left lateral calf above the lateral malleolus open wound requiring debridement with easy bleeding. Left dorsal wound proximal to first toe also debrided. Left ischial area  open new. Patient has been using Prisma with wrapping every 3 days. Dermatology appointment is apparently tomorrow.Patient has completed his terbinafine 2-week course with some apparent improvement according to him, there is still flaking and dry skin in his foot on the left 3/23; area on the right foot is reopened. The area on the left anterior foot is about the same still [name] very necrotic adherent surface. He still has the area on the left leg and reopening is on the left buttock. He apparently saw dermatology although I do not have [name] note. According to the patient who is usually fairly well informed they did not have any good ideas. Put him on oral terbinafine which she is been on before. 3/30; using silver collagen to all wounds. Apparently his dermatologist put him on doxycycline and rifampin presumably some culture grew staph. I do not have this result. He remains on terbinafine although I have used terbinafine on him before 4/6; patient has had [name] fairly substantial reopening on the right foot between the first and second toes. He is finished his terbinafine and I believe is on doxycycline and rifampin still as prescribed by dermatology. We have been using silver collagen to all his wounds although the patient reports that he thinks silver alginate does better on the wounds on his buttock. 4/13; the area on his left lateral calf about the same size but it did not require debridement. Left dorsal foot just proximal to the webspace between the first and second toes is about the same. Still nonviable surface. I note some superficial bronze discoloration of the dorsal part of his foot Right dorsal foot just proximal to the first and second toes also looks about the same. I still think there may be the same discoloration I noted above on the left Left buttock wound looks about the same 4/20; left lateral calf appears to be gradually contracting using silver collagen. He remains on erythromycin  empiric treatment for possible erythrasma involving his digital spaces. The left dorsal foot wound is debrided of tightly adherent necrotic debris and really cleans up quite nicely. The right area is worse with expansion. I did not debride this it is now over the base of the second toe The area on his left buttock is smaller no debridement is required using silver collagen 5/4; left calf continues to make good progress. He arrives with erythema around the wounds on his dorsal foot which even extends to the plantar aspect. Very concerning for coexistent infection. He is finished the erythromycin I gave him for possible erythrasma this does not seem to have helped. The area on the left foot is about the same base of the dorsal toes Is area on the buttock looks improved on the left 5/11; left calf and left buttock continued to make good progress. Left foot is about the same to slightly improved. Major problem is on the right foot. He has not had an x-ray. Deep tissue culture I did last week showed both Enterobacter and E. coli. I did not change the doxycycline I put him on empirically although neither 1 of these were plated to doxycycline. He arrives today with the erythema looking worse on both the  dorsal and plantar foot. Macerated skin on the bottom of the foot. he has not been systemically unwell 5/18-Patient returns at 1 week, left calf wound appears to be making some progress, left buttock wound appears slightly worse than last time, left foot wound looks slightly better, right foot redness is marginally better. X-ray of both feet show no air or evidence of osteomyelitis. Patient is finished his Omnicef and terbinafine. He continues to have macerated skin on the bottom of the left foot as well as right 5/26; left calf wound is better, left buttock wound appears to have multiple small superficial open areas with surrounding macerated skin. X-rays that I did last time showed no evidence of  osteomyelitis in either foot. He is finished cefdinir and doxycycline. I do not think that he was on terbinafine. He continues to have [name] large superficial open area on the right foot anterior dorsal and slightly between the first and second toes. I did send him to dermatology 2 months ago or so wondering about whether they would do [name] fungal scraping. I do not believe they did but did do [name] culture. We have been using silver alginate to the toe areas, he has been using antifungals at home topically either ketoconazole or Lotrisone. We are using silver collagen on the left foot, silver alginate on the right, silver collagen on the left lateral leg and silver alginate on the left buttock 6/1; left buttock area is healed. We have the left dorsal foot, left lateral leg and right dorsal foot. We are using silver alginate to the areas on both feet and silver collagen to the area on his left lateral calf 6/8; the left buttock apparently reopened late last week. He is not really sure how this happened. He is tolerating the terbinafine. Using silver alginate to all wounds 6/15; left buttock wound is larger than last week but still superficial. Came in the clinic today with [name] report of purulence from the left lateral leg I did not identify any infection Both areas on his dorsal feet appear to be better. He is tolerating the terbinafine. Using silver alginate to all wounds 6/22; left buttock is about the same this week, left calf quite [name] bit better. His left foot is about the same however he comes in with erythema and warmth in the right forefoot once again. Culture that I gave him in the beginning of May showed Enterobacter and E. coli. I gave him doxycycline and things seem to improve although neither 1 of these organisms was specifically plated. 6/29; left buttock is larger and dry this week. Left lateral calf looks to me to be improved. Left dorsal foot also somewhat improved right foot completely unchanged.  The erythema on the right foot is still present. He is completing the Ceftin dinner that I gave him empirically [see discussion above.) 7/6 - All wounds look to be stable and perhaps improved, the left buttock wound is slightly smaller, per patient bleeds easily, completed ceftin, the right foot redness is less, he is on terbinafine 7/13; left buttock wound about the same perhaps slightly narrower. Area on the left lateral leg continues to narrow. Left dorsal foot slightly smaller right foot about the same. We are using silver alginate on the right foot and Hydrofera Blue to the areas on the left. Unna boot on the left 2 layer compression on the right 7/20; left buttock wound absolutely the same. Area on lateral leg continues to get better. Left dorsal foot require debridement as  did the right no major change in the 7/27; left buttock wound the same size necrotic debris over the surface. The area on the lateral leg is closed once again. His left foot looks better right foot about the same although there is some involvement now of the posterior first second toe area. He is still on terbinafine which I have given him for [name] month, not certain [name] centimeter major change 06/25/19-All wounds appear to be slightly improved according to report, left buttock wound looks clean, both foot wounds have minimal to no debris the right dorsal foot has minimal slough. We are using Hydrofera Blue to the left and silver alginate to the right foot and ischial wound. 8/10-Wounds all appear to be around the same, the right forefoot distal part has some redness which was not there before, however the wound looks clean and small. Ischial wound looks about the same with no changes 8/17; his wound on the left lateral calf which was his original chronic venous insufficiency wound remains closed. Since I last saw him the areas on the left dorsal foot right dorsal foot generally appear better but require debridement. The area on  his left initial tuberosity appears somewhat larger to me perhaps hyper granulated and bleeds very easily. We have been using Hydrofera Blue to the left dorsal foot and silver alginate to everything else 8/24; left lateral calf remains closed. The areas on his dorsal feet on the webspace of the first and second toes bilaterally both look better. The area on the left buttock which is the pressure ulcer stage II slightly smaller. I change the dressing to Hydrofera Blue to all areas 8/31; left lateral calf remains closed. The area on his dorsal feet bilaterally look better. Using Hydrofera Blue. Still requiring debridement on the left foot. No change in the left buttock pressure ulcers however 9/14; left lateral calf remains closed. Dorsal feet look quite [name] bit better than 2 weeks ago. Flaking dry skin also [name] lot better with the ammonium lactate I gave him 2 weeks ago. The area on the left buttock is improved. He states that his Roho cushion developed [name] leak and he is getting [name] new one, in the interim he is offloading this vigorously 9/21; left calf remains closed. Left heel which was [name] possible DTI looks better this week. He had macerated tissue around the left dorsal foot right foot looks satisfactory and improved left buttock wound. I changed his dressings to his feet to silver alginate bilaterally. Continuing Hydrofera Blue on the left buttock. 9/28 left calf remains closed. Left heel did not develop anything [possible DTI] dry flaking skin on the left dorsal foot. Right foot looks satisfactory. Improved left buttock wound. We are using silver alginate on his feet Hydrofera Blue on the buttock. I have asked him to go back to the Lotrisone on his feet including the wounds and surrounding areas 10/5; left calf remains closed. The areas on the left and right feet about the same. [name] lot of this is epithelialized however debris over the remaining open areas. He is using Lotrisone and silver alginate. The  area on the left buttock using Hydrofera Blue 10/26. Patient has been out for 3 weeks secondary to Covid concerns. He tested negative but I think his wife tested positive. He comes in today with the left foot substantially worse, right foot about the same. Even more concerning he states that the area on his left buttock closed over but then reopened and is considerably deeper  in one aspect than it was before [stage III wound] 11/2; left foot really about the same as last week. Quarter sized wound on the dorsal foot just proximal to the first second toes. Surrounding erythema with areas of denuded epithelium. This is not really much different looking. Did not look like cellulitis this time however. Right foot area about the same.. We have been using silver alginate alginate on his toes Left buttock still substantial irritated skin around the wound which I think looks somewhat better. We have been using Hydrofera Blue here. 11/9; left foot larger than last week and [name] very necrotic surface. Right foot I think is about the same perhaps slightly smaller. Debris around the circumference also addressed. Unfortunately on the left buttock there is been [name] decline. Satellite lesions below the major wound distally and now [name] an additional one posteriorly we have been using Hydrofera Blue but I think this is [name] pressure issue 11/16; left foot ulcer dorsally again [name] very adherent necrotic surface. Right foot is about the same. Not much change in the pressure ulcer on his left buttock. 11/30; left foot ulcer dorsally basically the same as when I saw him 2 weeks ago. Very adherent fibrinous debris on the wound surface. Patient reports [name] lot of drainage as well. 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 We have been using Iodoflex, patient changed back to alginate because of the drainage. Area on his right dorsal foot really looks benign with [name] healthier surface certainly  [name] lot better than on the left. Left buttock wounds all improved using Hydrofera Blue 12/7; left dorsal foot again no improvement. Tightly adherent debris. PCR culture I did last week only showed likely skin contaminant. I have gone ahead and done [name] punch biopsy of this which is about the last thing in terms of investigations I can think to do. He has known venous insufficiency and venous hypertension and this could be the issue here. The area on the right foot is about the same left buttock slightly worse according to our intake nurse secondary to [facility] Blue sticking to the wound 12/14; biopsy of the left foot that I did last time showed changes that could be related to wound healing/chronic stasis dermatitis phenomenon no neoplasm. We have been using silver alginate to both feet. I change the one on the left today to Sorbact and silver alginate to his other 2 wounds 12/28; the patient arrives with the following problems; Major issue is the dorsal left foot which continues to be [name] larger deeper wound area. Still with [name] completely nonviable surface Paradoxically the area mirror image on the right on the right dorsal foot appears to be getting better. He had some loss of dry denuded skin from the lower part of his original wound on the left lateral calf. Some of this area looked [name] little vulnerable and for this reason we put him in wrap that on this side this week The area on his left buttock is larger. He still has the erythematous circular area which I think is [name] combination of pressure, sweat. This does not look like cellulitis or fungal dermatitis 11/26/2019; -Dorsal left foot large open wound with depth. Still debris over the surface. Using Sorbact The area on the dorsal right foot paradoxically has closed over He has [name] reopening on the left ankle laterally at the base of his original wound that extended up into the calf. This appears clean. The left buttock  wound is smaller but with very  adherent necrotic debris over the surface. We have been using silver alginate here as well The patient had arterial studies done in 2017. He had biphasic waveforms at the dorsalis pedis and posterior tibial bilaterally. ABI in the left was 1.17. Digit waveforms were dampened. He has slight spasticity in the great toes I do not think [name] TBI would be possible 1/11; the patient comes in today with [name] sizable reopening between the first and second toes on the right. This is not exactly in the same location where we have been treating wounds previously. According to our intake nurse this was actually fairly deep but 0.6 cm. The area on the left dorsal foot looks about the same the surface is somewhat cleaner using Sorbact, his MRI is in 2 days. We have not managed yet to get arterial studies. The new reopening on the left lateral calf looks somewhat better using alginate. The left buttock wound is about the same using alginate 1/18; the patient had his ARTERIAL studies which were quite normal. ABI in the right at 1.13 with triphasic/biphasic waveforms on the left ABI 1.06 again with triphasic/biphasic waveforms. It would not have been possible to have done [name] toe brachial index because of spasticity. We have been using Sorbac to the left foot alginate to the rest of his wounds on the right foot left lateral calf and left buttock 1/25; arrives in clinic with erythema and swelling of the left forefoot worse over the first MTP area. This extends laterally dorsally and but also posteriorly. Still has an area on the left lateral part of the lower part of his calf wound it is eschared and clearly not closed. Area on the left buttock still with surrounding irritation and erythema. Right foot surface wound dorsally. The area between the right and first and second toes appears better. 2/1; The left foot wound is about the same. Erythema slightly better I gave him [name] week of doxycycline empirically Right foot wound is  more extensive extending between the toes to the plantar surface Left lateral calf really no open surface on the inferior part of his original wound however the entire area still looks vulnerable Absolutely no improvement in the left buttock wound required debridement. 2/8; the left foot is about the same. Erythema is slightly improved I gave him clindamycin last week. Right foot looks better he is using Lotrimin and silver alginate He has [name] breakdown in the left lateral calf. Denuded epithelium which I have removed Left buttock about the same were using Hydrofera Blue 2/15; left foot is about the same there is less surrounding erythema. Surface still has tightly adherent debris which I have debriding however not making any progress Right foot has [name] substantial wound on the medial right second toe between the first and second webspace. Still an open area on the left lateral calf distal area. Buttock wound is about the same 2/22; left foot is about the same less surrounding erythema. Surface has adherent debris. Polymen Ag Right foot area significant wound between the first and second toes. We have been using silver alginate here Left lateral leg polymen Ag at the base of his original venous insufficiency wound Left buttock some improvement here 3/1; Right foot is deteriorating in the first second toe webspace. Larger and more substantial. We have been using silver alginate. Left dorsal foot about the same markedly adherent surface debris using PolyMem Ag Left lateral calf surface debris using PolyMem AG Left buttock  is improved again using PolyMem Ag. He is completing his terbinafine. The erythema in the foot seems better. He has been on this for 2 weeks 3/8; no improvement in any wound area in fact he has [name] small open area on the dorsal midfoot which is new this week. He has not gotten his foot x-rays yet 3/15; his x-rays were both negative for osteomyelitis of both feet. No major change in  any of his wounds on the extremities however his buttock wounds are better. We have been using polymen on the buttocks, left lower leg. Iodoflex on the left foot and silver alginate on the right 3/22; arrives in clinic today with the 2 major issues are the improvement in the left dorsal foot wound which for once actually looks healthy with [name] nice healthy wound surface without debridement. Using Iodoflex here. Unfortunately on the left lateral calf which is in the distal part of his original wound he came to the clinic here for there was purulent drainage noted some increased breakdown scattered around the original area and [name] small area proximally. We we are using polymen here will change to silver alginate today. His buttock wound on the left is better and I think the area on the right first second toe webspace is also improved 3/29; left dorsal foot looks better. Using Iodoflex. Left ankle culture from deterioration last time grew E. coli, Enterobacter and Enterococcus. I will give him [name] course of cefdinir although that will not cover Enterococcus. The area on the right foot in the webspace of the first and second toe lateral first toe looks better. The area on his buttock is about healed Vascular appointment is on April 21. This is to look at his venous system vis--vis continued breakdown of the wounds on the left including the left lateral leg and left dorsal foot he. He has had previous ablations on this side 4/5; the area between the right first and second toes lateral aspect of the first toe looks better. Dorsal aspect of the left first toe on the left foot also improved. Unfortunately the left lateral lower leg is larger and there is [name] second satellite wound superiorly. The usual superficial abrasions on the left buttock overall better but certainly not closed 4/12; the area between the right first and second toes is improved. Dorsal aspect of the left foot also slightly smaller with [name] vibrant  healthy looking surface. No real change in the left lateral leg and the left buttock wound is healed He has an unaffordable co-pay for Apligraf. Appointment with vein and vascular with regards to the left leg venous part of the circulation is on 4/21 4/19; we continue to see improvement in all wound areas. Although this is minor. He has his vascular appointment on 4/21. The area on the left buttock has not reopened although right in the center of this area the skin looks somewhat threatened 4/26; the left buttock is unfortunately reopened. In general his left dorsal foot has [name] healthy surface and looks somewhat smaller although it was not measured as such. The area between his first and second toe webspace on the right as [name] small wound against the first toe. The patient saw vascular surgery. The real question I was asking was about the small saphenous vein on the left. He has previously ablated left greater saphenous vein. Nothing further was commented on on the left. Right greater saphenous vein without reflux at the saphenofemoral junction or proximal thigh there was no indication for  ablation of the right greater saphenous vein duplex was negative for DVT bilaterally. They did not think there was anything from [name] point of view that could be offered. They ABIs within normal limits 5/3; only small open area on the left buttock. The area on the left lateral leg which was his original venous reflux is now 2 wounds both which look clean. We are using Iodoflex on the left dorsal foot which looks healthy and smaller. He is down to [name] very tiny area between the right first and second toes, using silver alginate 5/10; all of his wounds appear better. We have much better edema control in 4 layer compression on the left. This may be the factor that is allowing the left foot and left lateral calf to heal. He has external compression garments at home 04/14/20-All of his wounds are progressing  well, the left forefoot is practically closed, left ischium appears to be about the same, right toe webspace is also smaller. The left lateral leg is about the same, continue using Hydrofera Blue to this, silver alginate to the ischium, Iodoflex to the toe space on the right 6/7; most of his wounds outside of the left buttock are doing well. The area on the left lateral calf and left dorsal foot are smaller. The area on the right foot in between the first and second toe webspace is barely visible although he still says there is some drainage here is the only reason I did not heal this out. Unfortunately the area on the left buttock almost looks like he has [name] skin tear from tape. He has open wound and then [name] large flap of skin that we are trying to get adherence over an area just next to the remaining wound 6/21; 2 week follow-up. I believe is been here for nurse visits. Miraculously the area between his first and second toes on the left dorsal foot is closed over. Still open on the right first second web space. The left lateral calf has 2 open areas. Distally this is more superficial. The proximal area had [name] little more depth and required debridement of adherent necrotic material. His buttock wound is actually larger we have been using silver alginate here 6/28; the patient's area on the left foot remains closed. Still open wet area between the first and second toes on the right and also extending into the plantar aspect. We have been using silver alginate in this location. He has 2 areas on the left lower leg part of his original long wounds which I think are better. We have been using Hydrofera Blue here. Hydrofera Blue to the left buttock which is stable 7/12; left foot remains closed. Left ankle is closed. May be [name] small area between his right first and second toes the only truly open area is on the left buttock. We have been using Hydrofera Blue here 7/19; patient arrives with marked deterioration  especially in the left foot and ankle. We did not put him in [name] compression wrap on the left last week in fact he wore his juxta lite stockings on either side although he does not have an underlying stocking. He has [name] reopening on the left dorsal foot, left lateral ankle and [name] new area on the right dorsal ankle. More worrisome is the degree of erythema on the left foot extending on the lateral foot into the lateral lower leg on the left 7/26; the patient had erythema and drainage from the lateral left ankle last  week. Culture of this grew MRSA resistant to doxycycline and clindamycin which are the 2 antibiotics we usually use with this patient who has multiple antibiotic allergies including linezolid, trimethoprim sulfamethoxazole. I had give him an empiric doxycycline and he comes in the area certainly looks somewhat better although it is blotchy in his lower leg. He has not been systemically unwell. He has had areas on the left dorsal foot which is [name] reopening, chronic wounds on the left lateral ankle. Both of these I think are secondary to chronic venous insufficiency. The area between his first and second toes is closed as far as I can tell. He had [name] new wrap injury on the right dorsal ankle last week. Finally he has an area on the left buttock. We have been using silver alginate to everything except the left buttock we are using Hydrofera Blue 06/30/20-Patient returns at 1 week, has been given [name] sample dose pack of NUZYRA which is [name] tetracycline derivative [omadacycline], patient has completed those, we have been using silver alginate to almost all the wounds except the left ischium where we are using Hydrofera Blue all of them look better 8/16; since I last saw the patient he has been doing well. The area on the left buttock, left lateral ankle and left foot are all closed today. He has completed the [place] I gave him last time and tolerated this well. He still has open areas on the right dorsal ankle  and in the right first second toe area which we are using silver alginate. 8/23; we put him in his bilateral external compression stockings last week as he did not have anything open on either leg except for concerning area between the right first and second toe. He comes in today with an area on the left dorsal foot slightly more proximal than the original wound, the left lateral foot but this is actually [name] continuation of the area he had on the left lateral ankle from last time. As well he is opened up on the left buttock again. 8/30; comes in today with things looking [name] lot better. The area on the left lower ankle has closed down as has the left foot but with eschar in both areas. The area on the dorsal right ankle is also epithelialized. Very little remaining of the left buttock wound. We have been using silver alginate on all wound areas 9/13; the area in the first second toe webspace on the right has fully epithelialized. He still has some vulnerable epithelium on the right and the ankle and the dorsal foot. He notes weeping. He is using his juxta lite stocking. On the left again the left dorsal foot is closed left lateral ankle is closed. We went to the juxta lite stocking here as well. Still vulnerable in the left buttock although only 2 small open areas remain here 9/27; 2-week follow-up. We did not look at his left leg but the patient says everything is closed. He is [name] bit disturbed by the amount of edema in his left foot he is using juxta lite stockings but asking about over the toes stockings which would be 30/40, will talk to him next time. According to him there is no open wound on either the left foot or the left ankle/calf He has an open area on the dorsal right calf which I initially point [name] wrap injury. He has superficial remaining wound on the left ischial tuberosity been using silver alginate although he says this sticks to the  wound 10/5; we gave him 2-week follow-up but he  called yesterday expressing some concerns about his right foot right ankle and the left buttock. He came in early. There is still no open areas on the left leg and that still in his juxta lite stocking 10/11; he only has 1 small area on the left buttock that remains measuring millimeters 1 mm. Still has the same irritated skin in this area. We recommended zinc oxide when this eventually closes and pressure relief is meticulously is he can do this. He still has an area on the dorsal part of his right first through third toes which is [name] bit irritated and still open and on the dorsal ankle near the crease of the ankle. We have been using silver alginate and using his own stocking. He has nothing open on the left leg or foot 10/25; 2-week follow-up. Not nearly as good on the left buttock as I was hoping. For open areas with 5 looking threatened small. He has the erythematous irritated chronic skin in this area. 1 area on the right dorsal ankle. He reports this area bleeds easily Right dorsal foot just proximal to the base of his toes We have been using silver alginate. 11/8; 2-week follow-up. Left buttock is about the same although I do not think the wounds are in the same location we have been using silver alginate. I have asked him to use zinc oxide on the skin around the wounds. He still has [name] small area on the right dorsal ankle he reports this bleeds easily Right dorsal foot just proximal to the base of the toes does not have anything open although the skin is very dry and scaly He has [name] new opening on the nailbed of the left great toe. Nothing on the left ankle 11/29; 3-week follow-up. Left buttock has 2 open areas. And washing of these wounds today started bleeding easily. Suggesting very friable tissue. We have been using silver alginate. Right dorsal ankle which I thought was initially [name] wrap injury we have been using silver alginate. Nothing open between the toes that I can see. He states the  area on the left dorsal toe nailbed healed after the last visit in 2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 studies. [name] T brachial index was 1.3 oe on the right. His waveforms were biphasic bilaterally. Doppler waveforms of  the digit were normal in the right damp and on the left. Comment that this could've been due to extreme edema. His venous studies show reflux on both sides in the femoral popliteal veins as well as the greater and lesser saphenous veins bilaterally. Ultimately he is going to need to see vascular surgery about this issue. Hopefully when we can get his wounds and [name] little better shape. 02/19/16; the patient was able to complete [name] course of [facility] for MRSA in the face of multiple antibiotic allergies. Arterial studies showed an ABI of him 0.88 on the right 1.17 on the left the. Waveforms were biphasic at the posterior tibial and dorsalis pedis digital waveforms were normal. Right toe brachial index was 1.3 limited by shaking and edema. His venous study showed widespread reflux in the left at the common femoral vein the greater and lesser saphenous vein the greater and lesser saphenous vein on the right as well as the popliteal and femoral vein. The popliteal and femoral vein on the left did not show reflux. His wounds on the right leg give healed on the left he is still using Santyl. 02/26/16; patient completed [name] treatment with Dalvance for MRSA in the wound with associated erythema. The erythema has not really resolved and I wonder if this is mostly venous inflammation rather than cellulitis. Still using Santyl. He is approved for Apligraf 03/04/16;  there is less erythema around the wound. Both wounds require aggressive surgical debridement. Not yet ready for Apligraf 03/11/16; aggressive debridement again. Not ready for Apligraf 03/18/16 aggressive debridement again. 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 Has been to see vascular surgery he is being planned for [name] venous ablation 03/25/16; aggressive debridement again of both wound areas on the left lateral leg. He is due for ablation surgery on May 22. He is much closer to being ready for an Apligraf. Has [name] new area between the left first and second toes 04/01/16 aggressive debridement done of both wounds. The new wound at the base of between his second and first toes looks stable 04/08/16; continued aggressive debridement of both wounds on the left lower leg. He goes for his venous ablation on Monday. The new wound at the base of his first and second toes dorsally appears stable. 04/15/16; wounds aggressively debridement although the base of this looks considerably better Apligraf #1. He had ablation surgery on Monday I'll need to research these records. We only have approval for four Apligraf's 04/22/16; the patient is here for [name] wound check [Apligraf last week] intake nurse concerned about erythema around the wounds. Apparently [name] significant degree of drainage. The patient has chronic venous inflammation which I think accounts for most of this however I was asked to look at this today 04/26/16; the patient came back for check of possible cellulitis in his left foot however the Apligraf dressing was inadvertently removed therefore we elected to prep the wound for [name] second Apligraf. I put him on doxycycline on 6/1 the erythema in the foot 05/03/16 we did not remove the dressing from the superior wound as this is where I put all of his last Apligraf. Surface debridement done with [name] curette of the lower wound which looks very healthy. The area on the left foot also looks quite satisfactory at the  dorsal artery at the first and second toes 05/10/16; continue Apligraf to this. Her wound, Hydrafera to the lower wound. He has [name] new area on the right second toe. Left dorsal foot firstoosecond toe also  looks improved 05/24/16; wound dimensions must be smaller I was able to use Apligraf to all 3 remaining wound areas. 06/07/16 patient's last Apligraf was 2 weeks ago. He arrives today with the 2 wounds on his lateral left leg joined together. This would have to be seen as [name] negative. He also has [name] small wound in his first and second toe on the left dorsally with quite [name] bit of surrounding erythema in the first second and third toes. This looks to be infected or inflamed, very difficult clinical call. 06/21/16: lateral left leg combined wounds. Adherent surface slough area on the left dorsal foot at roughly the fourth toe looks improved 07/12/16; he now has [name] single linear wound on the lateral left leg. This does not look to be [name] lot changed from when I lost saw this. The area on his dorsal left foot looks considerably better however. 08/02/16; no major change in the substantial area on his left lateral leg since last time. We have been using Hydrofera Blue for [name] prolonged period of time now. The area on his left foot is also unchanged from last review 07/19/16; the area on his dorsal foot on the left looks considerably smaller. He is beginning to have significant rims of epithelialization on the lateral left leg wound. This also looks better. 08/05/16; the patient came in for [name] nurse visit today. Apparently the area on his left lateral leg looks better and it was wrapped. However in general discussion the patient noted [name] new area on the dorsal aspect of his right second toe. The exact etiology of this is unclear but likely relates to pressure. 08/09/16 really the area on the left lateral leg did not really look that healthy today perhaps slightly larger and measurements. The area on his dorsal right second  toe is improved also the left foot wound looks stable to improved 08/16/16; the area on the last lateral leg did not change any of dimensions. Post debridement with [name] curet the area looked better. Left foot wound improved and the area on the dorsal right second toe is improved 08/23/16; the area on the left lateral leg may be slightly smaller both in terms of length and width. Aggressive debridement with [name] curette afterwards the tissue appears healthier. Left foot wound appears improved in the area on the dorsal right second toe is improved 08/30/16 patient developed [name] fever over the weekend and was seen in an urgent care. Felt to have [name] UTI and put on doxycycline. He has been since changed over the phone to [facility]. After we took off the wrap on his right leg today the leg is swollen warm and erythematous, probably more likely the source of the fever 09/06/16; have been using collagen to the major left leg wound, silver alginate to the area on his anterior foot/toes 09/13/16; the areas on his anterior foot/toes on both sides appear to be virtually closed. Extensive wound on the left lateral leg perhaps slightly narrower but each visit still covered an adherent surface slough 09/16/16 patient was in for his usual Thursday nurse visit however the intake nurse noted significant erythema of his dorsal right foot. He is also running [name] low- grade fever and having increasing spasms in the right leg 09/20/16 here for cellulitis involving his right great toes and forefoot. This is [name] lot better. Still requiring debridement on his left lateral leg. Santyl direct says he needs prior authorization. Therefore his wife cannot change this at home 09/30/16; the patient's extensive  area on the left lateral calf and ankle perhaps somewhat better. Using Santyl. The area on the left toes is healed and I think the area on his right dorsal foot is healed as well. There is no cellulitis or venous inflammation involving the  right leg. He is going to need compression stockings here. 10/07/16; the patient's extensive wound on the left lateral calf and ankle does not measure any differently however there appears to be less adherent surface slough using Santyl and aggressive weekly debridements 10/21/16; no major change in the area on the left lateral calf. Still the same measurement still very difficult to debridement adherent slough and nonviable subcutaneous tissue. This is not really been helped by several weeks of Santyl. Previously for 2 weeks I used Iodoflex for [name] short period. [name] prolonged course of Hydrofera Blue didn't really help. I'm not sure why I only used 2 weeks of Iodoflex on this there is no evidence of surrounding infection. He has [name] small area on the right second toe which looks as though it's progressing towards closure 10/28/16; the wounds on his toes appear to be closed. No major change in the left lateral leg wound although the surface looks somewhat better using Iodoflex. He has had previous arterial studies that were normal. He has had reflux studies and is status post ablation although I don't have any exact notes on which vein was ablated. I'll need to check the surgical record 11/04/16; he's had [name] reopening between the first and second toe on the left and right. No major change in the left lateral leg wound. There is what appears to be cellulitis of the left dorsal foot 11/18/16 the patient was hospitalized initially in [place] and then subsequently transferred to [facility] and was admitted there from 11/09/16 through 11/12/16. He had developed progressive cellulitis on the right leg in spite of the doxycycline I gave him. I'd spoken to the hospitalist in [place] who was concerned about continuing leukocytosis. CT scan is what I suggested this was done which showed soft tissue swelling without evidence of osteomyelitis or an underlying abscess blood cultures were negative. At [facility] he was  treated with vancomycin and Primaxin and then add an infectious disease consult. He was transitioned to Ceftaroline. He has been making progressive improvement. Overall [name] severe cellulitis of the right leg. He is been using silver alginate to her original wound on the left leg. The wounds in his toes on the right are closed there is [name] small open area on the base of the left second toe 11/26/15; the patient's right leg is much better although there is still some edema here this could be reminiscent from his severe cellulitis likely on top of some degree of lymphedema. His left anterior leg wound has less surface slough as reported by her intake nurse. Small wound at the base of the left second toe 12/02/16; patient's right leg is better and there is no open wound here. His left anterior lateral leg wound continues to have [name] healthy-looking surface. Small wound at the base of the left second toe however there is erythema in the left forefoot which is worrisome 12/16/16; is no open wounds on his right leg. We took measurements for stockings. His left anterior lateral leg wound continues to have [name] healthy-looking surface. I'm not sure where we were with the Apligraf run through his insurance. We have been using Iodoflex. He has [name] thick eschar on the left first second toe interface, I suspect this  may be fungal however there is no visible open 12/23/16; no open wound on his right leg. He has 2 small areas left of the linear wound that was remaining last week. We have been using Prisma, I thought I have disclosed this week, we can only look forward to next week 01/03/17; the patient had concerning areas of erythema last week, already on doxycycline for UTI through his primary doctor. The erythema is absolutely no better there is warmth and swelling both medially from the left lateral leg wound and also the dorsal left foot. 01/06/17- Patient is here for follow-up evaluation of his left lateral leg ulcer and  bilateral feet ulcers. He is on oral antibiotic therapy, tolerating that. Nursing staff and the patient states that the erythema is improved from Monday. 01/13/17; the predominant left lateral leg wound continues to be problematic. I had put Apligraf on him earlier this month once. However he subsequently developed what appeared to be an intense cellulitis around the left lateral leg wound. I gave him Dalvance I think on 2/12 perhaps 2/13 he continues on cefdinir. The erythema is still present but the warmth and swelling is improved. I am hopeful that the cellulitis part of this control. I wouldn't be surprised if there is an element of venous inflammation as well. 01/17/17. The erythema is present but better in the left leg. His left lateral leg wound still does not have [name] viable surface buttons certain parts of this long thin wound it appears like there has been improvement in dimensions. 01/20/17; the erythema still present but much better in the left leg. I'm thinking this is his usual degree of chronic venous inflammation. The wound on the left leg looks somewhat better. Is less surface slough 01/27/17; erythema is back to the chronic venous inflammation. The wound on the left leg is somewhat better. I am back to the point where I like to try an Apligraf once again 02/10/17; slight improvement in wound dimensions. Apligraf #2. He is completing his doxycycline 02/14/17; patient arrives today having completed doxycycline last Thursday. This was supposed to be [name] nurse visit however once again he hasn't tense erythema from the medial part of his wound extending over the lower leg. Also erythema in his foot this is roughly in the same distribution as last time. He has baseline chronic venous inflammation however this is [name] lot worse than the baseline I have learned to accept the on him is baseline inflammation 02/24/17- patient is here for follow-up evaluation. He is tolerating compression therapy. His voicing  no complaints or concerns he is here anticipating an Apligraf 03/03/17; he arrives today with an adherent necrotic surface. I don't think this is surface is going to be amenable for Apligraf's. The erythema around his wound and on the left dorsal foot has resolved he is off antibiotics 03/10/17; better-looking surface today. I don't think he can tolerate Apligraf's. He tells me he had [name] wound VAC after [name] skin graft years ago to this area and they had difficulty with [name] seal. The erythema continues to be stable around this some degree of chronic venous inflammation but he also has recurrent cellulitis. We have been using Iodoflex 03/17/17; continued improvement in the surface and may be small changes in dimensions. Using Iodoflex which seems the only thing that will control his surface 03/24/17- He is here for follow up evaluation of his LLE lateral ulceration and ulcer to right dorsal foot/toe space. He is voicing no complaints or concerns, He is  tolerating compression wrap. 03/31/17 arrives today with [name] much healthier looking wound on the left lower extremity. We have been using Iodoflex for [name] prolonged period of time which has for the first time prepared and adequate looking wound bed although we have not had much in the way of wound dimension improvement. He also has [name] small wound between the first and second toe on the right 04/07/17; arrives today with [name] healthy-looking wound bed and at least the top 50% of this wound appears to be now her. No debridement was required I have changed him to [facility] last week after prolonged Iodoflex. He did not do well with Apligraf's. We've had [name] re-opening between the first and second toe on the right 04/14/17; arrives today with [name] healthier looking wound bed contractions and the top 50% of this wound and some on the lesser 50%. Wound bed appears healthy. The area between the first and second toe on the right still remains problematic 04/21/17; continued very  gradual improvement. Using [facility] 04/28/17; continued very gradual improvement in the left lateral leg venous insufficiency wound. His periwound erythema is very mild. We have been using Hydrofera Blue. Wound is making progress especially in the superior 50% 05/05/17; he continues to have very gradual improvement in the left lateral venous insufficiency wound. Both in terms with an length rings are improving. I debrided this every 2 weeks with #5 curet and we have been using Hydrofera Blue and again making good progress With regards to the wounds between his right first and second toe which I thought might of been tinea pedis he is not making as much progress very dry scaly skin over the area. Also the area at the base of the left first and second toe in [name] similar condition 05/12/17; continued gradual improvement in the refractory left lateral venous insufficiency wound on the left. Dimension smaller. Surface still requiring debridement using Hydrofera Blue 05/19/17; continued gradual improvement in the refractory left lateral venous ulceration. Careful inspection of the wound bed underlying rumination suggested some degree of epithelialization over the surface no debridement indicated. Continue Hydrofera Blue difficult areas between his toes first and third on the left than first and second on the right. I'm going to change to silver alginate from silver collagen. Continue ketoconazole as I suspect underlying tinea pedis 05/26/17; left lateral leg venous insufficiency wound. We've been using Hydrofera Blue. I believe that there is expanding epithelialization over the surface of the wound albeit not coming from the wound circumference. This is [name] bit of an odd situation in which the epithelialization seems to be coming from the surface of the wound rather than in the exact circumference. There is still small open areas mostly along the lateral margin of the wound. ooHe has unchanged areas between the  left first and second and the right first second toes which I been treating for tenia pedis 06/02/17; left lateral leg venous insufficiency wound. We have been using Hydrofera Blue. Somewhat smaller from the wound circumference. The surface of the wound remains [name] bit on it almost epithelialized sedation in appearance. I use an open curette today debridement in the surface of all of this especially the edges ooSmall open wounds remaining on the dorsal right first and second toe interspace and the plantar left first second toe and her face on the left 06/09/17; wound on the left lateral leg continues to be smaller but very gradual and very dry surface using Hydrofera Blue 06/16/17 requires weekly debridements now  on the left lateral leg although this continues to contract. I changed to silver collagen last week because of dryness of the wound bed. Using Iodoflex to the areas on his first and second toes/web space bilaterally 06/24/17; patient with history of paraplegia also chronic venous insufficiency with lymphedema. Has [name] very difficult wound on the left lateral leg. This has been gradually reducing in terms of with but comes in with [name] very dry adherent surface. High switch to silver collagen [name] week or so ago with hydrogel to keep the area moist. This is been refractory to multiple dressing attempts. He also has areas in his first and second toes bilaterally in the anterior and posterior web space. I had been using Iodoflex here after [name] prolonged course of silver alginate with ketoconazole was ineffective [question tinea pedis] 07/14/17; patient arrives today with [name] very difficult adherent material over his left lateral lower leg wound. He also has surrounding erythema and poorly controlled edema. He was switched his Santyl last visit which the nurses are applying once during his doctor visit and once on [name] nurse visit. He was also reduced to 2 layer compression I'm not exactly sure of the issue  here. 07/21/17; better surface today after 1 week of Iodoflex. Significant cellulitis that we treated last week also better. [Doxycycline] 07/28/17 better surface today with now 2 weeks of Iodoflex. Significant cellulitis treated with doxycycline. He has now completed the doxycycline and he is back to his usual degree of chronic venous inflammation/stasis dermatitis. He reminds me he has had ablations surgery here 08/04/17; continued improvement with Iodoflex to the left lateral leg wound in terms of the surface of the wound although the dimensions are better. 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 Problematic areas on the plantar aspect of the first second toe web space on the left and the dorsal aspect of the first second toe web space on the right. At one point I felt these were probably related to chronic fungal infections in treated him aggressively for this although we have not made any improvement here. 08/11/17; left lateral leg. Surface continues to improve with the Iodoflex although we are not seeing much improvement in overall wound dimensions. Areas on his plantar left foot and right foot show no improvement. In fact the right foot looks somewhat worse 08/18/17; left lateral leg. We changed to [facility] Blue last week after [name] prolonged course of Iodoflex which helps get the surface better. It appears that the wound with is improved. Continue with difficult areas on the left dorsal first second and plantar first second on the right 09/01/17; patient arrives in clinic today having had [name] temperature of 103 yesterday. He was seen in the ER and [name] [facility]. The patient was concerned he could have cellulitis again in the right leg however they diagnosed him with [name] UTI and he is now on Keflex. He has [name] history of cellulitis which is been recurrent and difficult but this is been in the left leg, in the past 5 use doxycycline. He does  in and out catheterizations at home which are risk factors for UTI 09/08/17; patient will be completing his Keflex this weekend. The erythema on the left leg is considerably better. He has [name] new wound today on the medial part of the right leg small superficial almost looks like [name] skin tear. He has worsening of the area on the right dorsal first and second toe. His major  area on the left lateral leg is better. Using Hydrofera Blue on all areas 09/15/17; gradual reduction in width on the long wound in the left lateral leg. No debridement required. He also has wounds on the plantar aspect of his left first second toe web space and on the dorsal aspect of the right first second toe web space. 09/22/17; there continues to be very gradual improvements in the dimensions of the left lateral leg wound. He hasn't round erythematous spot with might be pressure on his wheelchair. There is no evidence obviously of infection no purulence no warmth ooHe has [name] dry scaled area on the plantar aspect of the left first second toe ooImproved area on the dorsal right first second toe. 09/29/17; left lateral leg wound continues to improve in dimensions mostly with an is still [name] fairly long but increasingly narrow wound. ooHe has [name] dry scaled area on the plantar aspect of his left first second toe web space ooIncreasingly concerning area on the dorsal right first second toe. In fact I am concerned today about possible cellulitis around this wound. The areas extending up his second toe and although there is deformities here almost appears to abut on the nailbed. 10/06/17; left lateral leg wound continues to make very gradual progress. Tissue culture I did from the right first second toe dorsal foot last time grew MRSA and enterococcus which was vancomycin sensitive. This was not sensitive to clindamycin or doxycycline. He is allergic to Zyvox and sulfa we have therefore arrange for him to have dalvance infusion tomorrow.  He is had this in the past and tolerated it well 10/20/17; left lateral leg wound continues to make decent progress. This is certainly reduced in terms of with there is advancing epithelialization.ooThe cellulitis in the right foot looks better although he still has [name] deep wound in the dorsal aspect of the first second toe web space. Plantar left first toe web space on the left I think is making some progress 10/27/17; left lateral leg wound continues to make decent progress. Advancing epithelialization.using Hydrofera Blue ooThe right first second toe web space wound is better-looking using silver alginate ooImprovement in the left plantar first second toe web space. Again using silver alginate 11/03/17 left lateral leg wound continues to make decent progress albeit slowly. Using Hydrofera Blue ooThe right per second toe web space continues to be [name] very problematic looking punched out wound. I obtained [name] piece of tissue for deep culture I did extensively treated this for fungus. It is difficult to imagine that this is [name] pressure area as the patient states other than going outside he doesn't really wear shoes at home ooThe left plantar first second toe web space looked fairly senescent. Necrotic edges. This required debridement oochange to Hydrofera Blue to all wound areas 11/10/17; left lateral leg wound continues to contract. Using Hydrofera Blue ooOn the right dorsal first second toe web space dorsally. Culture I did of this area last week grew MRSA there is not an easy oral option in this patient was multiple antibiotic allergies or intolerances. This was only [name] rare culture isolate I'm therefore going to use Bactroban under silver alginate ooOn the left plantar first second toe web space. Debridement is required here. This is also unchanged 11/17/17; left lateral leg wound continues to contract using Hydrofera Blue this is no longer the major issue. ooThe major concern here is the  right first second toe web space. He now has an open area going from dorsally to  the plantar aspect. There is now wound on the inner lateral part of the first toe. Not [name] very viable surface on this. There is erythema spreading medially into the forefoot. ooNo major change in the left first second toe plantar wound 11/24/17; left lateral leg wound continues to contract using Hydrofera Blue. Nice improvement today ooThe right first second toe web space all of this looks [name] lot less angry than last week. I have given him clindamycin and topical Bactroban for MRSA and terbinafine for the possibility of underlining tinea pedis that I could not control with ketoconazole. Looks somewhat better ooThe area on the plantar left first second toe web space is weeping with dried debris around the wound 12/01/17; left lateral leg wound continues to contract he Hydrofera Blue. It is becoming thinner in terms of with nevertheless it is making good improvement. ooThe right first second toe web space looks less angry but still [name] large necrotic-looking wounds starting on the plantar aspect of the right foot extending between the toes and now extensively on the base of the right second toe. I gave him clindamycin and topical Bactroban for MRSA anterior benefiting for the possibility of underlying tinea pedis. Not looking better today ooThe area on the left first/second toe looks better. Debrided of necrotic debris 12/05/17* the patient was worked in urgently today because over the weekend he found blood on his incontinence bad when he woke up. He was found to have an ulcer by his wife who does most of his wound care. He came in today for [place] to look at this. He has not had [name] history of wounds in his buttocks in spite of his paraplegia. 12/08/17; seen in follow-up today at his usual appointment. He was seen earlier this week and found to have [name] new wound on his buttock. We also follow him for wounds on the left lateral leg,  left first second toe web space and right first second toe web space 12/15/17; we have been using Hydrofera Blue to the left lateral leg which has improved. The right first second toe web space has also improved. Left first second toe web space plantar aspect looks stable. The left buttock has worsened using Santyl. Apparently the buttock has drainage 12/22/17; we have been using Hydrofera Blue to the left lateral leg which continues to improve now 2 small wounds separated by normal skin. He tells [place] he had [name] fever up to 100 yesterday he is prone to UTIs but has not noted anything different. He does in and out catheterizations. The area between the first and second toes today does not look good necrotic surface covered with what looks to be purulent drainage and erythema extending into the third toe. I had gotten this to something that I thought look better last time however it is not look good today. He also has [name] necrotic surface over the buttock wound which is expanded. I thought there might be infection under here so I removed [name] lot of the surface with [name] #5 curet though nothing look like it really needed culturing. He is been using Santyl to this area 12/27/17; his original wound on the left lateral leg continues to improve using Hydrofera Blue. I gave him samples of Baxdella although he was unable to take them out of fear for an allergic reaction ["lump in his throat"].the culture I did of the purulent drainage from his second toe last week showed both enterococcus and [name] set Enterobacter I was also concerned about  the erythema on the bottom of his foot although paradoxically although this looks somewhat better today. 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 We've been using silver alginate here. They came up today that he sleeps in [name] recliner, I'm not sure why but I asked him to stop this 01/03/18; his  original wound we've been using Hydrofera Blue is now separated into 2 areas. ooUlcer on his left buttock is better he is off the recliner and sleeping in bed ooFinally both wound areas between his first and second toes also looks some better 01/10/18; his original wound on the left lateral leg is now separated into 2 wounds we've been using Hydrofera Blue ooUlcer on his left buttock has some drainage. There is [name] small probing site going into muscle layer superiorly.using silver alginate -He arrives today with [name] deep tissue injury on the left heel ooThe wound on the dorsal aspect of his first second toe on the left looks [name] lot betterusing silver alginate ketoconazole ooThe area on the first second toe web space on the right also looks [name] lot bette 01/17/18; his original wound on the left lateral leg continues to progress using Hydrofera Blue ooUlcer on his left buttock also is smaller surface healthier except for [name] small probing site going into the muscle layer superiorly. 2.4 cm of tunneling in this area ooDTI on his left heel we have only been offloading. Looks better than last week no threatened open no evidence of infection oothe wound on the dorsal aspect of the first second toe on the left continues to look like it's regressing we have only been using silver alginate and terbinafine orally ooThe area in the first second toe web space on the right also looks to be [name] lot better using silver alginate and terbinafine I think this was prompted by tinea pedis 01/31/18; the patient was hospitalized in [place] last week apparently for [name] complicated UTI. He was discharged on cefepime he does in and out catheterizations. In the hospital he was discovered M I don't mild elevation of AST and ALT and the terbinafine was stopped.predictably the pressure ulcer on s his buttock looks betterusing silver alginate. The area on the left lateral leg also is better using Hydrofera Blue. The area between the  first and second toes on the left better. First and second toes on the right still substantial but better. Finally the DTI on the left heel has held together and looks like it's resolving 02/07/18-he is here in follow-up evaluation for multiple ulcerations. He has new injury to the lateral aspect of the last issue [name] pressure ulcer, he states this is from adhesive removal trauma. He states he has tried multiple adhesive products with no success. All other ulcers appear stable. The left heel DTI is resolving. We will continue with same treatment plan and follow-up next week. 02/14/18; follow-up for multiple areas. ooHe has [name] new area last week on the lateral aspect of his pressure ulcer more over the posterior trochanter. The original pressure ulcer looks quite stable has healthy granulation. 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 Almost fully epithelialized on the original superior area using Hydrofera Blue ooDTI on the left heel has peeled off this week to reveal [name] small superficial wound under denuded skin and subcutaneous tissue ooBoth areas between the first and  second toes look better including nothing open on the left 02/21/18; ooThe patient's wounds on his left ischial tuberosity and posterior left greater trochanter actually looked better. He has [name] large area of irritation around the area which I think is contact dermatitis. I am doubtful that this is fungal ooHis original wound on the left lateral calf continues to improve we have been using Hydrofera Blue ooThere is no open area in the left first second toe web space although there is [name] lot of thick callus ooThe DTI on the left heel required debridement today of necrotic surface eschar and subcutaneous tissue using silver alginate ooFinally the area on the right first second toe webspace continues to contract using silver  alginate and ketoconazole 02/28/18 ooLeft ischial tuberosity wounds look better using silver alginate. ooOriginal wound on the left calf only has one small open area left using Hydrofera Blue ooDTI on the left heel required debridement mostly removing skin from around this wound surface. Using silver alginate ooThe areas on the right first/second toe web space using silver alginate and ketoconazole 03/08/18 on evaluation today patient appears to be doing decently well as best I can tell in regard to his wounds. This is the first time that I have seen him as he generally is followed by Dr. Dellia Nims. With that being said none of his wounds appear to be infected he does have an area where there is some skin covering what appears to be [name] new wound on the left dorsal surface of his great toe. This is right at the nail bed. With that being said I do believe that debrided away some of the excess skin can be of benefit in this regard. Otherwise he has been tolerating the dressing changes without complication. 03/14/18; patient arrives today with the multiplicity of wounds that we are following. He has not been systemically unwell ooOriginal wound on the left lateral calf now only has 2 small open areas we've been using Hydrofera Blue which should continue ooThe deep tissue injury on the left heel requires debridement today. We've been using silver alginate ooThe left first second toe and the right first second toe are both are reminiscence what I think was tinea pedis. Apparently some of the callus Surface between the toes was removed last week when it started draining. ooPurulent drainage coming from the wound on the ischial tuberosity on the left. 03/21/18-He is here in follow-up evaluation for multiple wounds. 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 He tolerated debridement. The only change to last week's recommendations is  to discontinue antifungal cream between toes. He will follow-up next week 03/28/18; following up for multiple wounds;Concern this week is streaking redness and swelling in the right foot. He is going to need antibiotics for this. 03/31/18; follow-up for right foot cellulitis. Streaking redness and swelling in the right foot on 03/28/18. He has multiple antibiotic intolerances and [name] history of MRSA. I put him on clindamycin 300 mg every 6 and brought him in for [name] quick check. He has an open wound between his first and second toes on the right foot as [name] potential source. 04/04/18; ooRight foot cellulitis is resolving he is completing clindamycin. This is truly good news ooLeft lateral calf wound which is initial wound only has one small open area inferiorly this is close to healing out. He has compression stockings. We will use Hydrofera Blue right down to the epithelialization of this ooNonviable surface on the  left heel which was initially pressure with [name] DTI. We've been using Hydrofera Blue. I'm going to switch this back to silver alginate ooLeft first second toe/tinea pedis this looks better using silver alginate ooRight first second toe tinea pedis using silver alginate ooLarge pressure ulcers on theLeft ischial tuberosity. Small wound here Looks better. I am uncertain about the surface over the large wound. Using silver alginate 04/11/18; ooCellulitis in the right foot is resolved ooLeft lateral calf wound which was his original wounds still has 2 tiny open areas remaining this is just about closed ooNonviable surface on the left heel is better but still requires debridement ooLeft first second toe/tinea pedis still open using silver alginate ooRight first second toe wound tinea pedis I asked him to go back to using ketoconazole and silver alginate ooLarge pressure ulcers on the left ischial tuberosity this shear injury here is resolved. Wound is smaller. No evidence of infection using  silver alginate 04/18/18; ooPatient arrives with an intense area of cellulitis in the right mid lower calf extending into the right heel area. Bright red and warm. Smaller area on the left anterior leg. He has [name] significant history of MRSA. He will definitely need antibioticsoodoxycycline ooHe now has 2 open areas on the left ischial tuberosity the original large wound and now [name] satellite area which I think was above his initial satellite areas. Not [name] wonderful surface on this satellite area surrounding erythema which looks like pressure related. ooHis left lateral calf wound again his original wound is just about closed ooLeft heel pressure injury still requiring debridement ooLeft first second toe looks [name] lot better using silver alginate ooRight first second toe also using silver alginate and ketoconazole cream also looks better 04/20/18; the patient was worked in early today out of concerns with his cellulitis on the right leg. I had started him on doxycycline. This was 2 days ago. His wife was concerned about the swelling in the area. Also concerned about the left buttock. He has not been systemically unwell no fever chills. No nausea vomiting or diarrhea 04/25/18; the patient's left buttock wound is continued to deteriorate he is using Hydrofera Blue. He is still completing clindamycin for the cellulitis on the right leg although all of this looks better. 05/02/18 ooLeft buttock wound still with [name] lot of drainage and [name] very tightly adherent fibrinous necrotic surface. He has [name] deeper area superiorly ooThe left lateral calf wound is still closed ooDTI wound on the left heel necrotic surface especially the circumference using Iodoflex ooAreas between his left first second toe and right first second toe both look better. Dorsally and the right first second toe he had [name] necrotic surface although at smaller. In using silver alginate and ketoconazole. I did [name] culture last week which was [name]  deep tissue culture of the reminiscence of the open wound on the right first second toe dorsally. This grew [name] few Acinetobacter and [name] few methicillin-resistant staph aureus. Nevertheless the area actually this week looked better. I didn't feel the need to specifically address this at least in terms of systemic antibiotics. 05/09/18; wounds are measuring larger more drainage per our intake. We are using Santyl covered with alginate on the large superficial buttock wounds, Iodosorb on the left heel, ketoconazole and silver alginate to the dorsal first and second toes bilaterally. 05/16/18; ooThe area on his left buttock better in some aspects although the area superiorly over the ischial tuberosity required an extensive debridement.using Santyl ooLeft heel appears stable. Using  Iodoflex ooThe areas between his first and second toes are not bad however there is spreading erythema up the dorsal aspect of his left foot this looks like cellulitis again. He is insensate the erythema is really very brilliant.o Erysipelas He went to see an allergist days ago because he was itching part of this he had lab work done. This showed [name] white count of 15.1 with 70% neutrophils. Hemoglobin of 11.4 and [name] platelet count of 659,000. Last white count we had in [facility] was [name] 2-1/2 years ago which was 25.9 but he was ill at the time. He was able to show me some lab work that was done by his primary physician the pattern is about the same. 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 But prompted me to go ahead and do x-rays of both feet and the pelvis rule out osteomyelitis. He also had [name] comprehensive metabolic panel this was reasonably normal his albumin was 3.7 liver function tests BUN/creatinine all normal 05/23/18; x-rays of both his feet from last week were negative for underlying pulmonary abnormality. The x-ray of his pelvis however showed mild irregularity in the left ischial  which may represent some early osteomyelitis. The wound in the left ischial continues to get deeper clearly now exposed muscle. Each week necrotic surface material over this area. Whereas the rest of the wounds do not look so bad. ooThe left ischial wound we have been using Santyl and calcium alginate ooT the left heel surface necrotic debris using Iodoflex o ooThe left lateral leg is still healed ooAreas on the left dorsal foot and the right dorsal foot are about the same. There is some inflammation on the left which might represent contact dermatitis, fungal dermatitis I am doubtful cellulitis although this looks better than last week 05/30/18; CT scan done at Hospital did not show any osteomyelitis or abscess. Suggested the possibility of underlying cellulitis although I don't see [name] lot of evidence of this at the bedside ooThe wound itself on the left buttock/upper thigh actually looks somewhat better. No debridement ooLeft heel also looks better no debridement continue Iodoflex ooBoth dorsal first second toe spaces appear better using Lotrisone. Left still required debridement 06/06/18; ooIntake reported some purulent looking drainage from the left gluteal wound. Using Santyl and calcium alginate ooLeft heel looks better although still [name] nonviable surface requiring debridement ooThe left dorsal foot first/second webspace actually expanding and somewhat deeper. I may consider doing [name] shave biopsy of this area ooRight dorsal foot first/second webspace appears stable to improved. Using Lotrisone and silver alginate to both these areas 06/13/18 ooLeft gluteal surface looks better. Now separated in the 2 wounds. No debridement required. Still drainage. We'll continue silver alginate ooLeft heel continues to look better with Iodoflex continue this for at least another week ooOf his dorsal foot wounds the area on the left still has some depth although it looks better than last week. We've  been using Lotrisone and silver alginate 06/20/18 ooLeft gluteal continues to look better healthy tissue ooLeft heel continues to look better healthy granulation wound is smaller. He is using Iodoflex and his long as this continues continue the Iodoflex ooDorsal right foot looks better unfortunately dorsal left foot does not. There is swelling and erythema of his forefoot. He had minor trauma to this several days ago but doesn't think this was enough to have caused any tissue injury. Foot looks like cellulitis, we have had this problem before 06/27/18  on evaluation today patient appears to be doing [name] little worse in regard to his foot ulcer. 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 Both of which would really be his only options for treating this infection. In the past he has been given and effusion of Orbactiv. This is done very well for him in the past again it's one time dosing IV antibiotic therapy. Subsequently I do believe this is something we're gonna need to see about doing at this point in time. Currently his other wounds seem to be doing somewhat better in my pinion I'm pretty happy in that regard. 07/03/18 on evaluation today patient's wounds actually appear to be doing fairly well. He has been tolerating the dressing changes without complication. All in all he seems to be showing signs of improvement. In regard to the antibiotics he has been dealing with infectious disease since I saw him last week as far as getting this scheduled. In the end [facility]'s [facility] this coming Friday. In the meantime he has been continuing to perform the dressing changes in such as previous. There does not appear to be any evidence of infection worsengin at this time. 07/10/18; ooSince I last saw this man 2 weeks  ago things have actually improved. IV antibiotics of resulted in less forefoot erythema although there is still some present. He is not systemically unwell ooLeft buttock wounds o2 now have no depth there is increased epithelialization Using silver alginate ooLeft heel still requires debridement using Iodoflex ooLeft dorsal foot still with [name] sizable wound about the size of [name] border but healthy granulation ooRight dorsal foot still with [name] slitlike area using silver alginate 07/18/18; the patient's cellulitis in the left foot is improved in fact I think it is on its way to resolving. ooLeft buttock wounds o2 both look better although the larger one has hypertension granulation we've been using silver alginate ooLeft heel has some thick circumferential redundant skin over the wound edge which will need to be removed today we've been using Iodoflex ooLeft dorsal foot is still [name] sizable wound required debridement using silver alginate ooThe right dorsal foot is just about closed only [name] small open area remains here 07/25/18; left foot cellulitis is resolved ooLeft buttock wounds o2 both look better. Hyper-granulation on the major area ooLeft heel as some debris over the surface but otherwise looks [name] healthier wound. Using silver collagen ooRight dorsal foot is just about closed 07/31/18; arrives with our intake nurse worried about purulent drainage from the buttock. We had hyper-granulation here last week ooHis buttock wounds o2 continue to look better ooLeft heel some debris over the surface but measuring smaller. ooRight dorsal foot unfortunately has openings between the toes ooLeft foot superficial wound looks less aggravated. 08/07/18 ooButtock wounds continue to look better although some of her granulation and the larger medial wound. silver alginate ooLeft heel continues to look [name] lot better.silver collagen ooLeft foot superficial wound looks less stable. Requires debridement.  He has [name] new wound superficial area on the foot on the lateral dorsal foot. ooRight foot looks better using silver alginate without Lotrisone 08/14/2018; patient was in the ER last week diagnosed with [name] UTI. He is now on Cefpodoxime and Macrodantin. ooButtock wounds continued to be smaller. Using silver alginate ooLeft heel continues to  look better using silver collagen ooLeft foot superficial wound looks as though it is improving ooRight dorsal foot area is just about healed. 08/21/2018; patient is completed his antibiotics for his UTI. ooHe has 2 open areas on the buttocks. There is still not closed although the surface looks satisfactory. Using silver alginate ooLeft heel continues to improve using silver collagen ooThe bilateral dorsal foot areas which are at the base of his first and second toes/possible tinea pedis are actually stable on the left but worse on the right. The area on the left required debridement of necrotic surface. After debridement I obtained [name] specimen for PCR culture. ooThe right dorsal foot which is been just about healed last week is now reopened 08/28/2018; culture done on the left dorsal foot showed coag negative staph both staph epidermidis and Lugdunensis. I think this is worthwhile initiating systemic treatment. I will use doxycycline given his long list of allergies. The area on the left heel slightly improved but still requiring debridement. ooThe large wound on the buttock is just about closed whereas the smaller one is larger. Using silver alginate in this area 09/04/2018; patient is completing his doxycycline for the left foot although this continues to be [name] very difficult wound area with very adherent necrotic debris. We are using silver alginate to all his wounds right foot left foot and the small wounds on his buttock, silver collagen on the left heel. 09/11/2018; once again this patient has intense erythema and swelling of the left forefoot. Lesser  degrees of erythema in the right foot. He has [name] long list of allergies and intolerances. I will reinstitute doxycycline. 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 Using silver alginate ooLeft heel also looks better using silver collagen ooUnfortunately both the areas on his feet look worse. The area on the left first second webspace is now gone through to the plantar part of his foot. The area on the left foot anteriorly is irritated with erythema and swelling in the forefoot. 09/25/2018 ooHis wound on the left plantar heel looks better. Using silver collagen ooThe area on the left buttock 2 small remnant areas. One is closed one is still open. Using silver alginate ooThe areas between both his first and second toes look worse. This in spite of long-standing antifungal therapy with ketoconazole and silver alginate which should have antifungal activity ooHe has small areas around his original wound on the left calf one is on the bottom of the original scar tissue and one superiorly both of these are small and superficial but again given wound history in this site this is worrisome 10/02/2018 ooLeft plantar heel continues to gradually contract using silver collagen ooLeft buttock wound is unchanged using silver alginate ooThe areas on his dorsal feet between his first and second toes bilaterally look about the same. 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 Small open area here. We have been using silver alginate 10/09/2018; ooLeft plantar heel continues to look healthy and contract. No debridement is required ooLeft buttock slightly smaller with [name] tape injury wound just below which was new this week ooDorsal feet somewhat improved I have been using  clindamycin ooLeft lateral looks lower extremity the actual open area looks worse although [name] lot of this is epithelialized. I am going to  change to silver collagen today He has [name] lot more swelling in the right leg although this is not pitting not red and not particularly warm there is [name] lot of spasm in the right leg usually indicative of people with paralysis of some underlying discomfort. We have reviewed his vascular status from 2017 he had [name] left greater saphenous vein ablation. I wonder about referring him back to vascular surgery if the area on the left leg continues to deteriorate. 10/16/2018 in today for follow-up and management of multiple lower extremity ulcers. His left Buttock wound is much lower smaller and almost closed completely. The wound to the left ankle has began to reopen with Epithelialization and some adherent slough. He has multiple new areas to the left foot and leg. The left dorsal foot without much improvement. Wound present between left great webspace and 2nd toe. Erythema and edema present right leg. Right LE ultrasound obtained on 10/10/18 was negative for DVT . 10/23/2018; ooLeft buttock is closed over. Still dry macerated skin but there is no open wound. I suspect this is chronic pressure/moisture ooLeft lateral calf is quite [name] bit worse than when I saw this last. There is clearly drainage here he has macerated skin into the left plantar heel. We will change the primary dressing to alginate ooLeft dorsal foot has some improvement in overall wound area. Still using clindamycin and silver alginate ooRight dorsal foot about the same as the left using clindamycin and silver alginate ooThe erythema in the right leg has resolved. He is DVT rule out was negative ooLeft heel pressure area required debridement although the wound is smaller and the surface is health 10/26/2018 ooThe patient came back in for his nurse check today predominantly because of the drainage  coming out of the left lateral leg with [name] recent reopening of his original wound on the left lateral calf. He comes in today with [name] large amount of surrounding erythema around the wound extending from the calf into the ankle and even in the area on the dorsal foot. He is not systemically unwell. He is not febrile. Nevertheless this looks like cellulitis. We have been using silver alginate to the area. I changed him to [name] regular visit and I am going to prescribe him doxycycline. The rationale here is [name] long list of medication intolerances and [name] history of MRSA. I did not see anything that I thought would provide [name] valuable culture 10/30/2018 ooFollow-up from his appointment 4 days ago with really an extensive area of cellulitis in the left calf left lateral ankle and left dorsal foot. I put him on doxycycline. He has [name] long list of medication allergies which are true allergy reactions. 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 In any case he is [name] lot better today. The erythema especially in the anterior and lateral left calf is better. He still has left ankle erythema. He also is complaining about increasing edema in the right leg we have only been using Kerlix Coban and he has been doing the wraps at home. Finally he has [name] spotty rash on the medial part of his upper left calf which looks like folliculitis or perhaps wrap occlusion type injury. Small superficial macules not pustules 11/06/18 patient arrives today with again [name] considerable degree of erythema around the wound on the left lateral calf extending into the dorsal ankle and dorsal foot. This is [name] lot worse than when I saw this  last week. He is on doxycycline really with not [name] lot of improvement. He has not been systemically unwell Wounds on the; left heel actually looks improved. Original area on the left foot and proximity to the first and second toes looks about the same.  He has superficial areas on the dorsal foot, anterior calf and then the reopening of his original wound on the left lateral calf which looks about the same ooThe only area he has on the right is the dorsal webspace first and second which is smaller. ooHe has [name] large area of dry erythematous skin on the left buttock small open area here. 11/13/2018; the patient arrives in much better condition. The erythema around the wound on the left lateral calf is [name] lot better. Not sure whether this was the clindamycin or the TCA and ketoconazole or just in the improvement in edema control [stasis dermatitis]. In any case this is [name] lot better. The area on the left heel is very small and just about resolved using silver collagen we have been using silver alginate to the areas on his dorsal feet 11/20/2018; his wounds include the left lateral calf, left heel, dorsal aspects of both feet just proximal to the first second webspace. He is stable to slightly improved. I did not think any changes to his dressings were going to be necessary 11/27/2018 he has [name] reopening on the left buttock which is surrounded by what looks like tinea or perhaps some other form of dermatitis. The area on the left dorsal foot has some erythema around it I have marked this area but I am not sure whether this is cellulitis or not. Left heel is not closed. Left calf the reopening is really slightly longer and probably worse 1/13; in general things look better and smaller except for the left dorsal foot. Area on the left heel is just about closed, left buttock looks better only [name] small wound remains in the skin looks better [using Lotrisone] 1/20; the area on the left heel only has [name] few remaining open areas here. Left lateral calf about the same in terms of size, left dorsal foot slightly larger right lateral foot still not closed. The area on the left buttock has no open wound and the surrounding skin looks [name] lot better 1/27; the area on the  left heel is closed. Left lateral calf better but still requiring extensive debridements. The area on his left buttock is closed. He still has the open areas on the left dorsal foot which is slightly smaller in the right foot which is slightly expanded. We have been using Iodoflex on these areas as well 2/3; left heel is closed. Left lateral calf still requiring debridement using Iodoflex there is no open area on his left buttock however he has dry scaly skin over [name] large area of this. Not really responding well to the Lotrisone. Finally the areas on his dorsal feet at the level of the first second webspace are slightly smaller on the right and about the same on the left. Both of these vigorously debrided with Anasept and gauze 2/10; left heel remains closed he has dry erythematous skin over the left buttock but there is no open wound here. Left lateral leg has come in and with. Still requiring debridement we have been using Iodoflex here. Finally the area on the left dorsal foot and right dorsal foot are really about the same extremely dry callused fissured areas. He does not yet have [name] dermatology appointment 2/17;  left heel remains closed. He has [name] new open area on the left buttock. The area on the left lateral calf is bigger longer and still covered in necrotic debris. No major change in his foot areas bilaterally. I am awaiting for [name] dermatologist to look on this. We have been using ketoconazole I do not know that this is been doing any good at all. 2/24; left heel remains closed. The left buttock wound that was new reopening last week looks better. The left lateral calf appears better also although still requires debridement. The major area on his foot is the left first second also requiring debridement. We have been putting Prisma on all wounds. I do not believe that the ketoconazole has done too much good for his feet. He will use Lotrisone I am going to give him [name] 2-week course of terbinafine.  We still do not have [name] dermatology appointment 3/2 left heel remains closed however there is skin over bone in this area I pointed this out to him today. The left buttock wound is epithelialized but still does not look completely stable. The area on the left leg required debridement were using silver collagen here. With regards to his feet we changed to Lotrisone last week and silver alginate. 3/9; left heel remains closed. Left buttock remains closed. The area on the right foot is essentially closed. The left foot remains unchanged. Slightly smaller on the left lateral calf. Using silver collagen to both of these areas 3/16-Left heel remains closed. Area on right foot is closed. Left lateral calf above the lateral malleolus open wound requiring debridement with easy bleeding. Left dorsal wound proximal to first toe also debrided. Left ischial area open new. Patient has been using Prisma with wrapping every 3 days. Dermatology appointment is apparently tomorrow.Patient has completed his terbinafine 2-week course with some apparent improvement according to him, there is still flaking and dry skin in his foot on the left 3/23; area on the right foot is reopened. The area on the left anterior foot is about the same still [name] very necrotic adherent surface. He still has the area on the left leg and reopening is on the left buttock. He apparently saw dermatology although I do not have [name] note. According to the patient who is usually fairly well informed they did not have any good ideas. Put him on oral terbinafine which she is been on before. 3/30; using silver collagen to all wounds. Apparently his dermatologist put him on doxycycline and rifampin presumably some culture grew staph. I do not have this result. He remains on terbinafine although I have used terbinafine on him before 4/6; patient has had [name] fairly substantial reopening on the right foot between the first and second toes. He is finished his  terbinafine and I believe is on doxycycline and rifampin still as prescribed by dermatology. We have been using silver collagen to all his wounds although the patient reports that he thinks silver alginate does better on the wounds on his buttock. 4/13; the area on his left lateral calf about the same size but it did not require debridement. ooLeft dorsal foot just proximal to the webspace between the first and second toes is about the same. Still nonviable surface. I note some superficial bronze discoloration of the dorsal part of his foot ooRight dorsal foot just proximal to the first and second toes also looks about the same. I still think there may be the same discoloration I noted above on the left ooLeft  buttock wound looks about the same 4/20; left lateral calf appears to be gradually contracting using silver collagen. 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 The left dorsal foot wound is debrided of tightly adherent necrotic debris and really cleans up quite nicely. The right area is worse with expansion. I did not debride this it is now over the base of the second toe ooThe area on his left buttock is smaller no debridement is required using silver collagen 5/4; left calf continues to make good progress. ooHe arrives with erythema around the wounds on his dorsal foot which even extends to the plantar aspect. Very concerning for coexistent infection. He is finished the erythromycin I gave him for possible erythrasma this does not seem to have helped. ooThe area on the left foot is about the same base of the dorsal toes ooIs area on the buttock looks improved on the left 5/11; left calf and left buttock continued to make good progress. Left foot is about the same to slightly improved. ooMajor problem is on the right foot. He has not had an x-ray. Deep tissue culture I did last week showed both Enterobacter and E. coli. I  did not change the doxycycline I put him on empirically although neither 1 of these were plated to doxycycline. He arrives today with the erythema looking worse on both the dorsal and plantar foot. Macerated skin on the bottom of the foot. he has not been systemically unwell 5/18-Patient returns at 1 week, left calf wound appears to be making some progress, left buttock wound appears slightly worse than last time, left foot wound looks slightly better, right foot redness is marginally better. X-ray of both feet show no air or evidence of osteomyelitis. Patient is finished his Omnicef and terbinafine. He continues to have macerated skin on the bottom of the left foot as well as right 5/26; left calf wound is better, left buttock wound appears to have multiple small superficial open areas with surrounding macerated skin. X-rays that I did last time showed no evidence of osteomyelitis in either foot. He is finished cefdinir and doxycycline. I do not think that he was on terbinafine. He continues to have [name] large superficial open area on the right foot anterior dorsal and slightly between the first and second toes. I did send him to dermatology 2 months ago or so wondering about whether they would do [name] fungal scraping. I do not believe they did but did do [name] culture. We have been using silver alginate to the toe areas, he has been using antifungals at home topically either ketoconazole or Lotrisone. We are using silver collagen on the left foot, silver alginate on the right, silver collagen on the left lateral leg and silver alginate on the left buttock 6/1; left buttock area is healed. We have the left dorsal foot, left lateral leg and right dorsal foot. We are using silver alginate to the areas on both feet and silver collagen to the area on his left lateral calf 6/8; the left buttock apparently reopened late last week. He is not really sure how this happened. He is tolerating the terbinafine. Using silver  alginate to all wounds 6/15; left buttock wound is larger than last week but still superficial. ooCame in the clinic today with [name] report of purulence from the left lateral leg I did not identify any infection ooBoth areas on his dorsal feet appear to be better. He is tolerating the terbinafine. Using silver  alginate to all wounds 6/22; left buttock is about the same this week, left calf quite [name] bit better. His left foot is about the same however he comes in with erythema and warmth in the right forefoot once again. Culture that I gave him in the beginning of May showed Enterobacter and E. coli. I gave him doxycycline and things seem to improve although neither 1 of these organisms was specifically plated. 6/29; left buttock is larger and dry this week. Left lateral calf looks to me to be improved. Left dorsal foot also somewhat improved right foot completely unchanged. The erythema on the right foot is still present. He is completing the Ceftin dinner that I gave him empirically [see discussion above.) 7/6 - All wounds look to be stable and perhaps improved, the left buttock wound is slightly smaller, per patient bleeds easily, completed ceftin, the right foot redness is less, he is on terbinafine 7/13; left buttock wound about the same perhaps slightly narrower. Area on the left lateral leg continues to narrow. Left dorsal foot slightly smaller right foot about the same. We are using silver alginate on the right foot and Hydrofera Blue to the areas on the left. Unna boot on the left 2 layer compression on the right 7/20; left buttock wound absolutely the same. Area on lateral leg continues to get better. Left dorsal foot require debridement as did the right no major change in the 7/27; left buttock wound the same size necrotic debris over the surface. The area on the lateral leg is closed once again. His left foot looks better right foot about the same although there is some involvement now of  the posterior first second toe area. He is still on terbinafine which I have given him for [name] month, not certain [name] centimeter major change 06/25/19-All wounds appear to be slightly improved according to report, left buttock wound looks clean, both foot wounds have minimal to no debris the right dorsal foot has minimal slough. We are using Hydrofera Blue to the left and silver alginate to the right foot and ischial wound. 8/10-Wounds all appear to be around the same, the right forefoot distal part has some redness which was not there before, however the wound looks clean and small. Ischial wound looks about the same with no changes 8/17; his wound on the left lateral calf which was his original chronic venous insufficiency wound remains closed. Since I last saw him the areas on the left dorsal foot right dorsal foot generally appear better but require debridement. The area on his left initial tuberosity appears somewhat larger to me perhaps hyper granulated and bleeds very easily. We have been using Hydrofera Blue to the left dorsal foot and silver alginate to everything else 8/24; left lateral calf remains closed. The areas on his dorsal feet on the webspace of the first and second toes bilaterally both look better. The area on the left buttock which is the pressure ulcer stage II slightly smaller. I change the dressing to Hydrofera Blue to all areas 8/31; left lateral calf remains closed. The area on his dorsal feet bilaterally look better. Using Hydrofera Blue. Still requiring debridement on the left foot. No change in the left buttock pressure ulcers however 9/14; left lateral calf remains closed. Dorsal feet look quite [name] bit better than 2 weeks ago. Flaking dry skin also [name] lot better with the ammonium lactate I gave him 2 weeks ago. The area on the left buttock is improved. He states that his  Roho cushion developed [name] leak and he is getting [name] new one, in the interim he is offloading this  vigorously 9/21; left calf remains closed. Left heel which was [name] possible DTI looks better this week. He had macerated tissue around the left dorsal foot right foot looks satisfactory and improved left buttock wound. I changed his dressings to his feet to silver alginate bilaterally. Continuing Hydrofera Blue on the left buttock. 9/28 left calf remains closed. Left heel did not develop anything [possible DTI] dry flaking skin on the left dorsal foot. Right foot looks satisfactory. Improved left buttock wound. We are using silver alginate on his feet Hydrofera Blue on the buttock. I have asked him to go back to the Lotrisone on his feet including the wounds and surrounding areas 10/5; left calf remains closed. The areas on the left and right feet about the same. [name] lot of this is epithelialized however debris over the remaining open areas. He is using Lotrisone and silver alginate. The area on the left buttock using Hydrofera Blue 10/26. Patient has been out for 3 weeks secondary to Covid concerns. He tested negative but I think his wife tested positive. He comes in today with the left foot substantially worse, right foot about the same. Even more concerning he states that the area on his left buttock closed over but then reopened and is considerably deeper in one aspect than it was before [stage III wound] 11/2; left foot really about the same as last week. Quarter sized wound on the dorsal foot just proximal to the first second toes. Surrounding erythema with areas of denuded epithelium. This is not really much different looking. Did not look like cellulitis this time however. ooRight foot area about the same.. We have been using silver alginate alginate on his toes ooLeft buttock still substantial irritated skin around the wound which I think looks somewhat better. We have been using Hydrofera Blue here. 11/9; left foot larger than last week and [name] very necrotic surface. Right foot I think is about  the same perhaps slightly smaller. Debris around the circumference also addressed. Unfortunately on the left buttock there is been [name] decline. Satellite lesions below the major wound distally and now [name] an additional one posteriorly we have been using Hydrofera Blue but I think this is [name] pressure issue 11/16; left foot ulcer dorsally again [name] very adherent necrotic surface. Right foot is about the same. Not much change in the pressure ulcer on his left buttock. 11/30; left foot ulcer dorsally basically the same as when I saw him 2 weeks ago. Very adherent fibrinous debris on the wound surface. Patient reports [name] lot of drainage as well. 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 We have been using Iodoflex, patient changed back to alginate because of the drainage. Area on his right dorsal foot really looks benign with [name] healthier surface certainly [name] lot better than on the left. Left buttock wounds all improved using Hydrofera Blue 12/7; left dorsal foot again no improvement. Tightly adherent debris. PCR culture I did last week only showed likely skin contaminant. I have gone ahead and done [name] punch biopsy of this which is about the last thing in terms of investigations I can think to do. He has known venous insufficiency and venous hypertension and this could be the issue here. The area on the right foot is about the same left buttock slightly worse according to our intake nurse secondary to [facility]  Blue sticking to the wound 12/14; biopsy of the left foot that I did last time showed changes that could be related to wound healing/chronic stasis dermatitis phenomenon no neoplasm. We have been using silver alginate to both feet. I change the one on the left today to Sorbact and silver alginate to his other 2 wounds 12/28; the patient arrives with the following problems; ooMajor issue is the dorsal left foot which continues to be [name] larger deeper wound area.  Still with [name] completely nonviable surface ooParadoxically the area mirror image on the right on the right dorsal foot appears to be getting better. ooHe had some loss of dry denuded skin from the lower part of his original wound on the left lateral calf. Some of this area looked [name] little vulnerable and for this reason we put him in wrap that on this side this week ooThe area on his left buttock is larger. He still has the erythematous circular area which I think is [name] combination of pressure, sweat. This does not look like cellulitis or fungal dermatitis 11/26/2019; -Dorsal left foot large open wound with depth. Still debris over the surface. Using Sorbact ooThe area on the dorsal right foot paradoxically has closed over [facility] has [name] reopening on the left ankle laterally at the base of his original wound that extended up into the calf. This appears clean. ooThe left buttock wound is smaller but with very adherent necrotic debris over the surface. We have been using silver alginate here as well The patient had arterial studies done in 2017. He had biphasic waveforms at the dorsalis pedis and posterior tibial bilaterally. ABI in the left was 1.17. Digit waveforms were dampened. He has slight spasticity in the great toes I do not think [name] TBI would be possible 1/11; the patient comes in today with [name] sizable reopening between the first and second toes on the right. This is not exactly in the same location where we have been treating wounds previously. According to our intake nurse this was actually fairly deep but 0.6 cm. The area on the left dorsal foot looks about the same the surface is somewhat cleaner using Sorbact, his MRI is in 2 days. We have not managed yet to get arterial studies. The new reopening on the left lateral calf looks somewhat better using alginate. The left buttock wound is about the same using alginate 1/18; the patient had his ARTERIAL studies which were quite normal. ABI in the  right at 1.13 with triphasic/biphasic waveforms on the left ABI 1.06 again with triphasic/biphasic waveforms. It would not have been possible to have done [name] toe brachial index because of spasticity. We have been using Sorbac to the left foot alginate to the rest of his wounds on the right foot left lateral calf and left buttock 1/25; arrives in clinic with erythema and swelling of the left forefoot worse over the first MTP area. This extends laterally dorsally and but also posteriorly. Still has an area on the left lateral part of the lower part of his calf wound it is eschared and clearly not closed. ooArea on the left buttock still with surrounding irritation and erythema. ooRight foot surface wound dorsally. The area between the right and first and second toes appears better. 2/1; ooThe left foot wound is about the same. Erythema slightly better I gave him [name] week of doxycycline empirically ooRight foot wound is more extensive extending between the toes to the plantar surface ooLeft lateral calf really no open surface  on the inferior part of his original wound however the entire area still looks vulnerable ooAbsolutely no improvement in the left buttock wound required debridement. 2/8; the left foot is about the same. Erythema is slightly improved I gave him clindamycin last week. ooRight foot looks better he is using Lotrimin and silver alginate ooHe has [name] breakdown in the left lateral calf. Denuded epithelium which I have removed ooLeft buttock about the same were using Hydrofera Blue 2/15; left foot is about the same there is less surrounding erythema. Surface still has tightly adherent debris which I have debriding however not making any progress ooRight foot has [name] substantial wound on the medial right second toe between the first and second webspace. ooStill an open area on the left lateral calf distal area. ooButtock wound is about the same 2/22; left foot is about the same  less surrounding erythema. Surface has adherent debris. Polymen Ag Right foot area significant wound between the first and second toes. We have been using silver alginate here Left lateral leg polymen Ag at the base of his original venous insufficiency wound ooLeft buttock some improvement here 3/1; ooRight foot is deteriorating in the first second toe webspace. Larger and more substantial. We have been using silver alginate. ooLeft dorsal foot about the same markedly adherent surface debris using PolyMem Ag ooLeft lateral calf surface debris using PolyMem AG ooLeft buttock is improved again using PolyMem Ag. ooHe is completing his terbinafine. The erythema in the foot seems better. He has been on this for 2 weeks 3/8; no improvement in any wound area in fact he has [name] small open area on the dorsal midfoot which is new this week. He has not gotten his foot x-rays yet 3/15; his x-rays were both negative for osteomyelitis of both feet. No major change in any of his wounds on the extremities however his buttock wounds are better. We have been using polymen on the buttocks, left lower leg. Iodoflex on the left foot and silver alginate on the right 3/22; arrives in clinic today with the 2 major issues are the improvement in the left dorsal foot wound which for once actually looks healthy with [name] nice healthy wound surface without debridement. Using Iodoflex here. Unfortunately on the left lateral calf which is in the distal part of his original wound he came to the clinic here for there was purulent drainage noted some increased breakdown scattered around the original area and [name] small area proximally. We we are using polymen here will change to silver alginate today. His buttock wound on the left is better and I think the area on the right first second toe webspace is also improved 3/29; left dorsal foot looks better. Using Iodoflex. Left ankle culture from deterioration last time grew E. coli,  Enterobacter and Enterococcus. I will give him [name] course of cefdinir although that will not cover Enterococcus. The area on the right foot in the webspace of the first and second toe lateral first toe looks better. The area on his buttock is about healed Vascular appointment is on April 21. This is to look at his venous system vis--vis continued breakdown of the wounds on the left including the left lateral leg and left dorsal foot he. He has had previous ablations on this side 4/5; the area between the right first and second toes lateral aspect of the first toe looks better. Dorsal aspect of the left first toe on the left foot also improved. Unfortunately the left lateral lower leg  is larger and there is [name] second satellite wound superiorly. The usual superficial abrasions on the left buttock overall better but certainly not closed 4/12; the area between the right first and second toes is improved. Dorsal aspect of the left foot also slightly smaller with [name] vibrant healthy looking surface. No real change in the left lateral leg and the left buttock wound is healed He has an unaffordable co-pay for Apligraf. Appointment with vein and vascular with regards to the left leg venous part of the circulation is on 4/21 4/19; we continue to see improvement in all wound areas. Although this is minor. He has his vascular appointment on 4/21. The area on the left buttock has not reopened although right in the center of this area the skin looks somewhat threatened 4/26; the left buttock is unfortunately reopened. In general his left dorsal foot has [name] healthy surface and looks somewhat smaller although it was not measured as such. The area between his first and second toe webspace on the right as [name] small wound against the first toe. The patient saw vascular surgery. The real question I was asking was about the small saphenous vein on the left. He has previously ablated left greater saphenous vein. Nothing further  was commented on on the left. Right greater saphenous vein without reflux at the saphenofemoral junction or proximal thigh there was no indication for ablation of the right greater saphenous vein duplex was negative for DVT bilaterally. They did not think there was anything from [name] point of view that could be offered. They ABIs within normal limits 5/3; only small open area on the left buttock. The area on the left lateral leg which was his original venous reflux is now 2 wounds both which look clean. We are using Iodoflex on the left dorsal foot which looks healthy and smaller. He is down to [name] very tiny area between the right first and second toes, using silver alginate 5/10; all of his wounds appear better. We have much better edema control in 4 layer compression on the left. This may be the factor that is allowing the left foot and left lateral calf to heal. He has external compression garments at home 04/14/20-All of his wounds are progressing well, the left forefoot is practically closed, left ischium appears to be about the same, right toe webspace is also smaller. The left lateral leg is about the same, continue using Hydrofera Blue to this, silver alginate to the ischium, Iodoflex to the toe space on the right 6/7; most of his wounds outside of the left buttock are doing well. The area on the left lateral calf and left dorsal foot are smaller. The area on the right foot in between the first and second toe webspace is barely visible although he still says there is some drainage here is the only reason I did not heal this out. ooUnfortunately the area on the left buttock almost looks like he has [name] skin tear from tape. He has open wound and then [name] large flap of skin that we are trying to get adherence over an area just next to the remaining wound 6/21; 2 week follow-up. I believe is been here for nurse visits. Miraculously the area between his first and second toes on the left  dorsal foot is closed over. Still open on the right first second web space. The left lateral calf has 2 open areas. Distally this is more superficial. The proximal area had [name] little more depth  and required debridement of adherent necrotic material. His buttock wound is actually larger we have been using silver alginate here 6/28; the patient's area on the left foot remains closed. Still open wet area between the first and second toes on the right and also extending into the plantar aspect. We have been using silver alginate in this location. He has 2 areas on the left lower leg part of his original long wounds which I think are better. We have been using Hydrofera Blue here. Hydrofera Blue to the left buttock which is stable 7/12; left foot remains closed. Left ankle is closed. May be [name] small area between his right first and second toes the only truly open area is on the left buttock. We have been using Hydrofera Blue here 7/19; patient arrives with marked deterioration especially in the left foot and ankle. We did not put him in [name] compression wrap on the left last week in fact he wore his juxta lite stockings on either side although he does not have an underlying stocking. He has [name] reopening on the left dorsal foot, left lateral ankle and [name] new area on the right dorsal ankle. More worrisome is the degree of erythema on the left foot extending on the lateral foot into the lateral lower leg on the left 7/26; the patient had erythema and drainage from the lateral left ankle last week. Culture of this grew MRSA resistant to doxycycline and clindamycin which are the 2 antibiotics we usually use with this patient who has multiple antibiotic allergies including linezolid, trimethoprim sulfamethoxazole. I had give him an empiric doxycycline and he comes in the area certainly looks somewhat better although it is blotchy in his lower leg. He has not been systemically unwell. He has had areas on the left dorsal  foot which is [name] reopening, chronic wounds on the left lateral ankle. Both of these I think are secondary to chronic venous insufficiency. The area between his first and second toes is closed as far as I can tell. He had [name] new wrap injury on the right dorsal ankle last week. Finally he has an area on the left buttock. We have been using silver alginate to everything except the left buttock we are using Hydrofera Blue 06/30/20-Patient returns at 1 week, has been given [name] sample dose pack of NUZYRA which is [name] tetracycline derivative [omadacycline], patient has completed those, we have been using silver alginate to almost all the wounds except the left ischium where we are using Hydrofera Blue all of them look better 8/16; since I last saw the patient he has been doing well. The area on the left buttock, left lateral ankle and left foot are all closed today. He has completed the [place] I gave him last time and tolerated this well. He still has open areas on the right dorsal ankle and in the right first second toe area which we are using silver alginate. 8/23; we put him in his bilateral external compression stockings last week as he did not have anything open on either leg except for concerning area between the right first and second toe. He comes in today with an area on the left dorsal foot slightly more proximal than the original wound, the left lateral foot but this is actually [name] continuation of the area he had on the left lateral ankle from last time. As well he is opened up on the left buttock again. 8/30; comes in today with things looking [name] lot better. The area  on the left lower ankle has closed down as has the left foot but with eschar in both areas. The area on the dorsal right ankle is also epithelialized. Very little remaining of the left buttock wound. We have been using silver alginate on all wound areas 9/13; the area in the first second toe webspace on the right has fully epithelialized. He  still has some vulnerable epithelium on the right and the ankle and the dorsal foot. He notes weeping. He is using his juxta lite stocking. On the left again the left dorsal foot is closed left lateral ankle is closed. We went to the juxta lite stocking here as well. ooStill vulnerable in the left buttock although only 2 small open areas remain here 9/27; 2-week follow-up. We did not look at his left leg but the patient says everything is closed. He is [name] bit disturbed by the amount of edema in his left foot he is using juxta lite stockings but asking about over the toes stockings which would be 30/40, will talk to him next time. According to him there is no open wound on either the left foot or the left ankle/calf He has an open area on the dorsal right calf which I initially point [name] wrap injury. He has superficial remaining wound on the left ischial tuberosity been using silver alginate although he says this sticks to the wound 10/5; we gave him 2-week follow-up but he called yesterday expressing some concerns about his right foot right ankle and the left buttock. He came in early. There is still no open areas on the left leg and that still in his juxta lite stocking 10/11; he only has 1 small area on the left buttock that remains measuring millimeters 1 mm. Still has the same irritated skin in this area. We recommended zinc oxide when this eventually closes and pressure relief is meticulously is he can do this. He still has an area on the dorsal part of his right first through third toes which is [name] bit irritated and still open and on the dorsal ankle near the crease of the ankle. We have been using silver alginate and using his own stocking. He has nothing open on the left leg or foot 10/25; 2-week follow-up. Not nearly as good on the left buttock as I was hoping. For open areas with 5 looking threatened small. He has the erythematous irritated chronic skin in this area. oo1 area on the right  dorsal ankle. He reports this area bleeds easily ooRight dorsal foot just proximal to the base of his toes ooWe have been using silver alginate. 11/8; 2-week follow-up. Left buttock is about the same although I do not think the wounds are in the same location we have been using silver alginate. I have asked him to use zinc oxide on the skin around the wounds. ooHe still has [name] small area on the right dorsal ankle he reports this bleeds easily ooRight dorsal foot just proximal to the base of the toes does not have anything open although the skin is very dry and scaly ooHe has [name] new opening on the nailbed of the left great toe. Nothing on the left ankle 11/29; 3-week follow-up. Left buttock has 2 open areas. And washing of these wounds today started bleeding easily. Suggesting very friable tissue. We have been using silver alginate. Right dorsal ankle which I thought was initially [name] wrap injury we have been using silver alginate. Nothing open between the toes that I can  see. He states the area on the left dorsal toe nailbed healed after the last visit in 2 or 3 days 12/13; 3-week follow-up. His left buttock now has 3 open areas but the original 2 areas are smaller using polymen here. Surrounding skin looks better. The right dorsal ankle is closed. He has [name] small opening on the right dorsal foot at the level of the third toe. In general the skin looks better here. He is wearing his juxta lite stocking on the left leg says there is nothing open 11/24/2020; 3 weeks follow-up. His left buttock still has the 3 open areas. We have been using polymen but due to lack of response he changed to [facility] area. Surrounding skin is dry erythematous and irritated looking. There is no evidence of infection either bacterial or fungal however there is loss of surface epithelium ooHe still has very dry skin in his foot causing irritation and erythema on the dorsal part of his toes. This is not responded to  prolonged courses of antifungal simply looks dry and irritated 1/24; left buttock area still looks about the same he was unable to find the triad ointment that we had suggested. The area on the right lower leg just above the dorsal ankle has reopened and the areas on the right foot between the first second and second third toes and scaling on the bottom of the foot has been about the same for quite some time now. been using silver alginate to all wound areas 2/7; left buttock wound looked quite good although not much smaller in terms of surface area surrounding skin looks better. Only [name] few dry flaking areas on the right foot in between the first and second toes the skin generally looks better here [ammonium lactate]. Finally the area on the right dorsal ankle is closed 2/21; ooThere is no open area on the right foot even between the right first and second toe. Skin around this area dorsally and plantar aspects look better. ooHe has [name] reopening of the area on the right ankle just above the crease of the ankle dorsally. I continue to think that this is probably friction from spasms may be even this time with his stocking under the compression stockings. ooWounds on his left buttock look about the same there [name] couple of areas that have reopened. He has [name] total square area of loss of epithelialization. This does not look like infection it looks like [name] contact dermatitis but I just cannot determine to what 3/14; there is nothing on the right foot between the first and second toes this was carefully inspected under illumination. Some chronic irritation on the dorsal part of his foot from toes 1-3 at the base. Nothing really open here substantially. Still has an area on the right foot/ankle that is actually larger and hyper granulated. His buttock area on the left is just about closed however he has chronic inflammation with loss of the surface epithelial layer 3/28; 2-week follow-up. In clinic today  with [name] new wound on the left anterior mid tibia. Says this happened about 2 weeks ago. He is not really sure how wonders about the spasticity of his legs at night whether that could have caused this other than that he does not have [name] good idea. He has been using topical antibiotics and silver alginate. The area on his right dorsal ankle seems somewhat better. ooFinally everything on his left buttock is closed. 4/11; 2-week follow-up. All of his wounds are better except for  the area over the ischium and left buttock which have opened up widely again. At least part of this is covered in necrotic fibrinous material another part had rolled nonviable skin. The area on the right ankle, left anterior mid tibia are both [name] lot better. He had no open wounds on either foot including the areas between the first and second toes 4/25; patient presents for 2-week follow-up. He states that the wounds are overall stable. He has no complaints today and states he is using Hydrofera Blue to open wounds. 5/9; have not seen this man in over [name] month. For my memory he has open areas on the left mid tibia and right ankle. T oday he has new open area on the right dorsal foot which we have not had [name] problem with recently. He has the sustained area on the left buttock He is also changed his insurance at the beginning of the year [employer]. We will need prior authorizations for debridement 5/23; patient presents for 2-week follow-up. He has prior authorizations for debridement. He denies any issues in the past 2 weeks with his wound care. He has been using Hydrofera Blue to all the wounds. He does report [name] circular rash to the upper left leg that is new. He denies acute signs of infection. 6/6; 2-week follow-up. The patient has open wounds on the left buttock which are worse than the last time I saw this about [name] month ago. He also has [name] new area to me on the left anterior mid tibia with some surrounding erythema. The area  on the dorsal ankle on the right is closed but I think this will be [name] friction injury every time this area is exposed to either our wraps or his compression stockings caused by unrelenting spasms in this leg. 6/20; 2-week follow-up. oo The patient has open wounds on the left buttock which is about the same. Using [facility] here. - The left mid tibia has [name] static amount of surrounding erythema. Also [name] raised area in the center. We have been using Hydrofera Blue here. ooooFinally he has broken down in his dorsal right foot extending between the first and second toes and going to the base of the first and second toe webspace. I have previously assumed that this was severe venous hypertension 7/5; 2-week follow-up oo The left buttock wound actually looks better. We are using Hydrofera Blue. He has extensive skin irritation around this area and I have not really been able to get that any better. I have tried Lotrisone i.e. antifungals and steroids. More most recently we have just been using Coloplast really looks about the same. oo The left mid tibia which was new last week culture to have very resistant staph aureus. Not only methicillin-resistant but doxycycline resistant. The patient has [name] plethora of antibiotic allergies including sulfa, linezolid. I used topical bacitracin on this but he has not started this yet. oo In addition he has an expanding area of erythema with [name] wound on the dorsal right foot. I did [name] deep tissue culture of this area today 7/12; oo Left buttock area actually looks better surrounding skin also looks less irritated. oo Left mid tibia looks about the same. He is using bacitracin this is not worse oo Right dorsal foot looks about the same as well. oo The left first toe also looks about the same 7/19; left buttock wound continues to improve in terms of open areas oo Left mid tibia is still concerning amount of  swelling he is using bacitracin oo Dorsal left  first toe somewhat smaller oo Right dorsal foot somewhat smaller 7/25; left buttock wound actually continues to improve oo Left mid tibia area has less swelling. I gave him all my samples of new Nuzyra. This seems to have helped although the wound is still open it. His abrasion closed by here oo Left dorsal great toe really no better. Still [name] very nonviable surface oo Right dorsal foot perhaps some better. oo We have been using bacitracin and silver nitrate to the areas on his lower legs and Hydrofera Blue to the area on the buttock. 8/16 oo Disappointed that his left buttock wound is actually more substantial. Apparently during the last nurse visit these were both very small. He has continued irritation to [name] large area of skin on his buttock. I have never been able to totally explain this although I think it some combination of the way he sits, pressure, moisture. He is not incontinent enough to contribute to this. oo Left dorsal great toe still fibrinous debris on the surface that I have debrided today oo Large area across the dorsal right toes. oo The area on the left anterior mid tibia has less swelling. He completed the [place]. This does not look infected although the tissue is still fried 8/30; 2-week follow-up. oo Left buttock areas not improved. We used Hydrofera Blue on this. Weeping wet with the surrounding erythema that I have not been able to control even with Lotrisone and topical Coloplast oo Left dorsal great toe looks about the same oo More substantial area again at the base of his toes on the left which is new this week. oo Area across the dorsal right toes looks improved oo The left anterior mid tibia looks like it is trying to close 9/13; 2-week follow-up. Using silver alginate on all of his wounds. The left dorsal foot does not look any better. He has the area on the dorsal toe and also the areas at the base of all of his toes 1 through 3. On the right foot he  has [name] similar pattern in [name] similar area. He has the area on his left mid tibia that looks fairly healthy. Finally the area on his left buttock looks somewhat better Objective Constitutional Sitting or standing Blood Pressure is within target range for patient.. Pulse regular and within target range for patient.Marland Kitchen Respirations regular, non-labored and within target range.. Temperature is normal and within the target range for the patient.Marland Kitchen Appears in no distress. Vitals Time Taken: 8:10 AM, Height: 70 in, Weight: 216 lbs, BMI: 31, Temperature: 98.4 F, Pulse: 114 bpm, Respiratory Rate: 18 breaths/min, Blood Pressure: 129/62 mmHg. General Notes: Wound exam; right dorsal foot raised skin around the surface. He has simply too much spasm to consider debridement. On the left aggressive debridement of the large area on the left dorsal foot I did not debride the area on the dorsal left first toe postdebridement I did [name] deep tissue culture for CandS. oo The area on his left buttock look [name] lot better but still with the chronically aggravated skin Integumentary (Hair, Skin) Wound #41R status is Open. Original cause of wound was Gradually Appeared. The date acquired was: 03/16/2020. The wound has been in treatment 72 weeks. The wound is located on the Left Ischium. The wound measures 1.2cm length x 1cm width x 0.1cm depth; 0.942cm^2 area and 0.094cm^3 volume. There is Fat Layer (Subcutaneous Tissue) exposed. There is no tunneling or undermining noted. There  is [name] medium amount of sanguinous drainage noted. The wound margin is distinct with the outline attached to the wound base. There is large (67-100%) red, pink, friable granulation within the wound bed. There is no necrotic tissue within the wound bed. Wound #51 status is Open. Original cause of wound was Trauma. The date acquired was: 01/26/2021. The wound has been in treatment 24 weeks. The wound is located on the Left,Anterior Lower Leg. The wound measures  1cm length x 0.5cm width x 0.1cm depth; 0.393cm^2 area and 0.039cm^3 volume. There is Fat Layer (Subcutaneous Tissue) exposed. There is no tunneling or undermining noted. There is [name] medium amount of serosanguineous drainage noted. The wound margin is distinct with the outline attached to the wound base. There is large (67-100%) red, friable, hyper - granulation within the wound bed. There is no necrotic tissue within the wound bed. Wound #52 status is Open. Original cause of wound was Gradually Appeared. The date acquired was: 03/27/2021. The wound has been in treatment 18 weeks. The wound is located on the Right,Dorsal Foot. The wound measures 2cm length x 4.1cm width x 0.1cm depth; 6.44cm^2 area and 0.644cm^3 volume. There is Fat Layer (Subcutaneous Tissue) exposed. There is no tunneling or undermining noted. There is [name] medium amount of serosanguineous drainage noted. The wound margin is distinct with the outline attached to the wound base. There is small (1-33%) pink granulation within the wound bed. There is [name] large (67-100%) amount of necrotic tissue within the wound bed including Adherent Slough. Wound #54 status is Open. Original cause of wound was Pressure Injury. The date acquired was: 05/11/2021. The wound has been in treatment 12 weeks. The wound is located on the Left,Distal Ischium. The wound measures 1cm length x 0.5cm width x 0.1cm depth; 0.393cm^2 area and 0.039cm^3 volume. There is Fat Layer (Subcutaneous Tissue) exposed. There is [name] medium amount of sanguinous drainage noted. The wound margin is distinct with the outline attached to the wound base. There is large (67-100%) red, friable granulation within the wound bed. There is no necrotic tissue within the wound bed. Wound #55 status is Open. Original cause of wound was Blister. The date acquired was: 05/26/2021. The wound has been in treatment 10 weeks. The wound is located on the Left T Great. The wound measures 0.6cm length x 1cm width  x 0.1cm depth; 0.471cm^2 area and 0.047cm^3 volume. There is bone and Fat oe Layer (Subcutaneous Tissue) exposed. There is no tunneling or undermining noted. There is [name] medium amount of serosanguineous drainage noted. The wound margin is distinct with the outline attached to the wound base. There is medium (34-66%) pink granulation within the wound bed. There is [name] medium (34-66%) amount of necrotic tissue within the wound bed including Adherent Slough. Wound #56 status is Open. Original cause of wound was Gradually Appeared. The date acquired was: 07/11/2021. The wound has been in treatment 2 weeks. The wound is located on the Left,Dorsal Foot. The wound measures 3.7cm length x 3.5cm width x 0.2cm depth; 10.171cm^2 area and 2.034cm^3 volume. There is Fat Layer (Subcutaneous Tissue) exposed. There is [name] medium amount of purulent drainage noted. The wound margin is distinct with the outline attached to the wound base. There is small (1-33%) red granulation within the wound bed. There is [name] large (67-100%) amount of necrotic tissue within the wound bed including Adherent Slough. 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 There was [name] Excisional Skin/Subcutaneous Tissue Debridement with [name] total area of 12.95 sq cm performed by Ricard Dillon., MD. With the following instrument(s): Curette to remove Viable and Non- Viable tissue/material. Material removed includes Subcutaneous  Tissue and Slough and. 1 specimen was taken by [name] Tissue Culture and sent to the lab per facility protocol. [name] time out was conducted at 09:02, prior to the start of the procedure. [name] Minimum amount of bleeding was controlled with Pressure. The procedure was tolerated well with [name] pain level of 0 throughout and [name] pain level of 0 following the procedure. Post Debridement Measurements: 3.7cm length x 3.5cm width x 0.2cm depth; 2.034cm^3 volume. Character of Wound/Ulcer Post Debridement requires further debridement. Post procedure Diagnosis Wound #56: Same as Pre-Procedure Plan Follow-up Appointments: Return Appointment in 1 week. - with Dr. Dellia Nims Bathing/ Shower/ Hygiene: May shower and wash wound with soap and water. - on days that dressing is changed Edema Control - Lymphedema / SCD / Other: Elevate legs to the level of the heart or above for 30 minutes daily and/or when sitting, [name] frequency of: - throughout the day Compression stocking or Garment 30-40 mm/Hg pressure to: - Juxtalite to both legs daily Off-Loading: Roho cushion for wheelchair Turn and reposition every 2 hours Laboratory ordered were: Anerobic culture - Left dorsal foot - tissue WOUND #41R: - Ischium Wound Laterality: Left Cleanser: Soap and Water Every Other Day/30 Days Discharge Instructions: May shower and wash wound with dial antibacterial soap and water prior to dressing change. Peri-Wound Care: Triad Hydrophilic Wound Dressing Tube, 6 (oz) Every Other Day/30 Days Discharge Instructions: Apply to periwound with each dressing change Peri-Wound Care: Lotrisone Every Other Day/30 Days Discharge Instructions: thin layer under triad cream. Prim Dressing: KerraCel Ag Gelling Fiber Dressing, 4x5 in (silver alginate) Every Other Day/30 Days ary Discharge Instructions: Apply silver alginate to wound bed as instructed Secondary Dressing: ComfortFoam Border, 4x4 in (silicone border) Every Other Day/30 Days Discharge Instructions:  Apply over primary dressing as directed. WOUND #51: - Lower Leg Wound Laterality: Left, Anterior Cleanser: Soap and Water Every Other Day/30 Days Discharge Instructions: May shower and wash wound with dial antibacterial soap and water prior to dressing change. Prim Dressing: KerraCel Ag Gelling Fiber Dressing, 4x5 in (silver alginate) Every Other Day/30 Days ary Discharge Instructions: Apply silver alginate to wound bed as instructed Secondary Dressing: ComfortFoam Border, 4x4 in (silicone border) Every Other Day/30 Days Discharge Instructions: Apply over primary dressing as directed. WOUND #52: - Foot Wound Laterality: Dorsal, Right Cleanser: Soap and Water Every Other Day/30 Days Discharge Instructions: May shower and wash wound with dial antibacterial soap and water prior to dressing change. Prim Dressing: KerraCel Ag Gelling Fiber Dressing, 4x5 in (silver alginate) Every Other Day/30 Days ary Discharge Instructions: Apply silver alginate to wound bed as instructed Secondary Dressing: Woven Gauze Sponge, Non-Sterile 4x4 in Every Other Day/30 Days Discharge Instructions: Apply over primary dressing as directed. Secured With: [employer], 4.5x3.1 (in/yd) Every Other Day/30 Days Discharge Instructions: Secure with Kerlix as directed. Secured With: [employer] T ape, 2x10 (in/yd) Every Other Day/30 Days Discharge  Instructions: Secure dressing with tape as directed. WOUND #54: - Ischium Wound Laterality: Left, Distal Cleanser: Soap and Water Every Other Day/30 Days Discharge Instructions: May shower and wash wound with dial antibacterial soap and water prior to dressing change. Peri-Wound Care: Triad Hydrophilic Wound Dressing Tube, 6 (oz) Every Other Day/30 Days Discharge Instructions: Apply to periwound with each dressing change Peri-Wound Care: Lotrisone Every Other Day/30 Days Discharge Instructions: thin layer under triad cream. Prim Dressing: KerraCel Ag Gelling Fiber  Dressing, 4x5 in (silver alginate) Every Other Day/30 Days ary Discharge Instructions: Apply silver alginate to wound bed as instructed Secondary Dressing: ComfortFoam Border, 4x4 in (silicone border) Every Other Day/30 Days Discharge Instructions: Apply over primary dressing as directed. WOUND #55: - T Great Wound Laterality: Left oe Cleanser: Soap and Water Every Other Day/30 Days Discharge Instructions: May shower and wash wound with dial antibacterial soap and water prior to dressing change. Prim Dressing: KerraCel Ag Gelling Fiber Dressing, 4x5 in (silver alginate) Every Other Day/30 Days ary Discharge Instructions: Apply silver alginate to wound bed as instructed Secondary Dressing: Woven Gauze Sponge, Non-Sterile 4x4 in Every Other Day/30 Days Discharge Instructions: Apply over primary dressing as directed. Secured With: [employer], 4.5x3.1 (in/yd) Every Other Day/30 Days Discharge Instructions: Secure with Kerlix as directed. Secured With: [employer] T ape, 2x10 (in/yd) Every Other Day/30 Days Discharge Instructions: Secure dressing with tape as directed. WOUND #56: - Foot Wound Laterality: Dorsal, Left Cleanser: Soap and Water Every Other Day/30 Days Discharge Instructions: May shower and wash wound with dial antibacterial soap and water prior to dressing change. Prim Dressing: KerraCel Ag Gelling Fiber Dressing, 4x5 in (silver alginate) Every Other Day/30 Days ary Discharge Instructions: Apply silver alginate to wound bed as instructed Secondary Dressing: Woven Gauze Sponge, Non-Sterile 4x4 in Every Other Day/30 Days Discharge Instructions: Apply over primary dressing as directed. Secured With: [employer], 4.5x3.1 (in/yd) Every Other Day/30 Days Discharge Instructions: Secure with Kerlix as directed. Secured With: [employer] T ape, 2x10 (in/yd) Every Other Day/30 Days Discharge Instructions: Secure dressing with tape as directed. 1. Aggressive  debridement on the left foot. 2. We use [employer] and [name] training exercise today under fluorescence there was almost circumferential blush fluorescence on the left and the right foot. On the left dorsal mid tibia the wound itself did not fluoresce but there was some red fluorescence at the edge of the bandage. I was not really sure how to interpret this. Well away from the wound surface. I did not specifically address this. 3. Area on his left buttock looks much better. 4. No empiric antibiotics await for the culture of the left foot. If this is positive I may look at both systemic and topical antibiotic Electronic Signature(s) Signed: 08/05/2021 3:40:47 PM By: Linton Ham MD Entered By: Linton Ham on 08/04/2021 09:48:07 -------------------------------------------------------------------------------- SuperBill Details Patient Name: Date of Service: Jumper, [name] [name] 08/04/2021 Medical Record Number: [id number] Patient Account Number: [id number] Date of Birth/Sex: Treating RN: [birth year] (35 y.o. [name]: [place], [facility] Other Clinician: Referring Provider: Treating Provider/Extender: [name]: 291 Diagnosis Coding ICD-10 Codes Code Description I87.332 Chronic venous hypertension (idiopathic) with ulcer and inflammation of left lower extremity L97.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, stage 3 L97.821 Non-pressure chronic ulcer of other part of left lower leg limited to breakdown of skin L97.521 Non-pressure chronic ulcer of  other part of left foot limited to breakdown of skin G82.21 Paraplegia, complete Facility Procedures CPT4 Code: [id number] Description: [id number] - DEB SUBQ TISSUE 20 SQ CM/< ICD-10 Diagnosis Description L97.521 Non-pressure chronic ulcer of other part of left foot limited to breakdown of Modifier:  skin Quantity: 1 Physician Procedures : CPT4 Code Description Modifier [id number] - WC PHYS SUBQ TISS 20 SQ CM ICD-10 Diagnosis Description L97.521 Non-pressure chronic ulcer of other part of left foot limited to breakdown of skin Quantity: 1 Electronic Signature(s) Signed: 08/05/2021 3:40:47 PM By: Linton Ham MD Entered By: Linton Ham on 08/04/2021 09:48:26

## 2021-08-10 LAB — AEROBIC/ANAEROBIC CULTURE W GRAM STAIN (SURGICAL/DEEP WOUND): Gram Stain: NONE SEEN

## 2021-08-11 ENCOUNTER — Encounter (HOSPITAL_BASED_OUTPATIENT_CLINIC_OR_DEPARTMENT_OTHER): Payer: 59 | Admitting: Internal Medicine

## 2021-08-11 ENCOUNTER — Other Ambulatory Visit: Payer: Self-pay

## 2021-08-11 DIAGNOSIS — I87332 Chronic venous hypertension (idiopathic) with ulcer and inflammation of left lower extremity: Secondary | ICD-10-CM | POA: Diagnosis not present

## 2021-08-11 NOTE — Progress Notes (Signed)
[name], [name] LANTZY ([id number]) Visit Report for 08/11/2021 Arrival Information Details Patient Name: Date of Service: [name] [name], [name] [name] 08/11/2021 10:30 [name] M Medical Record Number: [id number] Patient Account Number: [id number] Date of Birth/Sex: Treating RN: 1986/11/10 (35 y.o. [name] Primary Care Brixon Zhen: [name], [name] Other Clinician: Referring Million Maharaj: Treating Kelyn Koskela/Extender: [name]: 292 Visit Information History Since Last Visit Added or deleted any medications: No Patient Arrived: Wheel Chair Any new allergies or adverse reactions: No Arrival Time: 10:37 Had [name] fall or experienced change in No Accompanied By: alone activities of daily living that may affect Transfer Assistance: None risk of falls: Patient Identification Verified: Yes Signs or symptoms of abuse/neglect since last visito No Secondary Verification Process Completed: Yes Hospitalized since last visit: No Patient Requires Transmission-Based Precautions: No Implantable device outside of the clinic excluding No Patient Has Alerts: Yes cellular tissue based products placed in the center Patient Alerts: R ABI = 1.0 since last visit: L ABI = 1.1 Has Dressing in Place as Prescribed: Yes Pain Present Now: No Electronic Signature(s) Signed: 08/11/2021 5:42:07 PM By: Levan Hurst RN, BSN Entered By: Levan Hurst on 08/11/2021 10:57:00 -------------------------------------------------------------------------------- Encounter Discharge Information Details Patient Name: Date of Service: [name] [name] [name] 08/11/2021 10:30 [name] M Medical Record Number: [id number] Patient Account Number: [id number] Date of Birth/Sex: Treating RN: 12-14-1985 (35 y.o. [name] Primary Care Nellene Courtois: [place], [place] Other Clinician: Referring Millenia Waldvogel: Treating Jaman Aro/Extender: [name]: 292 Encounter Discharge Information Items Post Procedure  Vitals Discharge Condition: Stable Temperature (F): 98.4 Ambulatory Status: Wheelchair Pulse (bpm): 123 Discharge Destination: Home Respiratory Rate (breaths/min): 16 Transportation: Private Auto Blood Pressure (mmHg): 120/76 Accompanied By: alone Schedule Follow-up Appointment: Yes Clinical Summary of Care: Patient Declined Electronic Signature(s) Signed: 08/11/2021 5:42:07 PM By: Levan Hurst RN, BSN Entered By: Levan Hurst on 08/11/2021 16:46:50 -------------------------------------------------------------------------------- Lower Extremity Assessment Details Patient Name: Date of Service: [name] [name] [name] 08/11/2021 10:30 [name] M Medical Record Number: [id number] Patient Account Number: [id number] Date of Birth/Sex: Treating RN: 22-Jul-1986 (35 y.o. [name] Primary Care Lysle Yero: [name], [place] Other Clinician: Referring Baldwin Racicot: Treating Keiri Solano/Extender: [name]: 292 Edema Assessment Assessed: [Left: No] [Right: No] Edema: [Left: Yes] [Right: Yes] Calf Left: Right: Point of Measurement: 33 cm From Medial Instep 28 cm 33.5 cm Ankle Left: Right: Point of Measurement: 10 cm From Medial Instep 25 cm 23.5 cm Vascular Assessment Pulses: Dorsalis Pedis Palpable: [Left:Yes] [Right:Yes] Electronic Signature(s) Signed: 08/11/2021 5:42:07 PM By: Levan Hurst RN, BSN Entered By: Levan Hurst on 08/11/2021 10:57:32 -------------------------------------------------------------------------------- Multi Wound Chart Details Patient Name: Date of Service: [name] [name] [name] 08/11/2021 10:30 [name] M Medical Record Number: [id number] Patient Account Number: [id number] Date of Birth/Sex: Treating RN: 02/19/1986 (35 y.o. [name] Primary Care Husayn Reim: [name], [name] Other Clinician: Referring Cadynce Garrette: Treating Elyssia Strausser/Extender: Dutch Gray, [name]: 292 Vital Signs Height(in):  70 Pulse(bpm): 123 Weight(lbs): 216 Blood Pressure(mmHg): 120/76 Body Mass Index(BMI): 31 Temperature(F): 98.4 Respiratory Rate(breaths/min): 16 Photos: [41R:No Photos Left Ischium] [51:No Photos Left, Anterior Lower Leg] [52:No Photos Right, Dorsal Foot] Wound Location: [41R:Gradually Appeared] [51:Trauma] [52:Gradually Appeared] Wounding Event: [41R:Pressure Ulcer] [51:Trauma, Other] [52:Trauma, Other] Primary Etiology: [41R:Sleep Apnea, Hypertension, Paraplegia Sleep Apnea, Hypertension, Paraplegia Sleep Apnea, Hypertension, Paraplegia] Comorbid History: [41R:03/16/2020] [51:01/26/2021] [52:03/27/2021] Date Acquired: [41R:73] [51:25] [52:19] Weeks of Treatment: [41R:Open] [51:Open] [52:Open] Wound Status: [41R:Yes] [51:No] [52:No] Wound Recurrence: [41R:Yes] [51:No] [52:No] Clustered Wound: [41R:1] [51:[name]] [52:[name]]  Clustered Quantity: [41R:0.5x0.5x0.1] [51:1.6x1x0.1] [52:2x4.5x0.1] Measurements L x W x D (cm) [41R:0.196] [51:1.257] [52:7.069] [name] (cm) : rea [41R:0.02] [51:0.126] [52:0.707] Volume (cm) : [41R:89.10%] [51:-280.90%] [52:-275.00%] % Reduction in Area: [41R:89.00%] [51:-281.80%] [52:-276.10%] % Reduction in Volume: [41R:Category/Stage II] [51:Full Thickness Without Exposed] [52:Full Thickness Without Exposed] Classification: [41R:Medium] [51:Support Structures Medium] [52:Support Structures Medium] Exudate [name] mount: [41R:Sanguinous] [51:Serosanguineous] [52:Serosanguineous] Exudate Type: [41R:red] [51:red, brown] [52:red, brown] Exudate Color: [41R:Distinct, outline attached] [51:Distinct, outline attached] [52:Distinct, outline attached] Wound Margin: [41R:Large (67-100%)] [51:Large (67-100%)] [52:Small (1-33%)] Granulation [name] mount: [41R:Red, Friable] [51:Red, Hyper-granulation, Friable] [52:Pink] Granulation Quality: [41R:None Present (0%)] [51:None Present (0%)] [52:Large (67-100%)] Necrotic [name] mount: [41R:Fat Layer (Subcutaneous Tissue): Yes Fat Layer (Subcutaneous  Tissue): Yes Fat Layer (Subcutaneous Tissue): Yes] Exposed Structures: [41R:Fascia: No Tendon: No Muscle: No Joint: No Bone: No Medium (34-66%)] [51:Fascia: No Tendon: No Muscle: No Joint: No Bone: No Small (1-33%)] [52:Fascia: No Tendon: No Muscle: No Joint: No Bone: No None] Epithelialization: [41R:[name]] [51:[name]] [52:[name]] Debridement: [41R:[name]] [51:[name]] [52:[name]] Tissue Debrided: [41R:[name]] [51:[name]] [52:[name]] Level: [41R:[name]] [51:[name]] [52:[name]] Debridement [name] (sq cm): [41R:rea [name]] [51:[name]] [52:[name]] Instrument: [41R:[name]] [51:[name]] [52:[name]] Bleeding: [41R:[name]] [51:[name]] [52:[name]] Hemostasis [name] chieved: [41R:[name]] [51:[name]] [52:[name]] Procedural Pain: [41R:[name]] [51:[name]] [52:[name]] Post Procedural Pain: Debridement Treatment Response: [name] [51:[name]] [52:[name]] Post Debridement Measurements L x [name] [51:[name]] [52:[name]] W x D (cm) [41R:[name]] [51:[name]] [52:[name]] Post Debridement Volume: (cm) [41R:[name]] [51:Chemical Cauterization] [52:[name]] Wound Number: [id number] Photos: No Photos No Photos No Photos Left, Distal Ischium Left T Great oe Left, Dorsal Foot Wound Location: Pressure Injury Blister Gradually Appeared Wounding Event: Pressure Ulcer 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 Primary Etiology: Sleep Apnea, Hypertension, Paraplegia Sleep Apnea, Hypertension, Paraplegia Sleep Apnea, Hypertension, Paraplegia Comorbid History: 05/11/2021 05/26/2021 07/11/2021 Date Acquired: 13 11 3  Weeks of Treatment: Open Open Open Wound Status: No No No Wound Recurrence: No No No Clustered Wound: [name] [name] [name] Clustered Quantity: 0.3x0.4x0.1 0.5x1x0.2 3.8x3x0.1 Measurements L x W x D (cm) 0.094 0.393 8.954 [name] (cm) : rea 0.009 0.079 0.895 Volume (cm) : 93.40% 49.90% -44.00% % Reduction in [name] rea: 93.60% 0.00% -43.90% % Reduction in Volume: Category/Stage III Full Thickness With Exposed Support Full Thickness Without Exposed Classification: Structures Support Structures Medium Medium  Medium Exudate [name] mount: Sanguinous Serosanguineous Purulent Exudate Type: red red, brown yellow, brown, green Exudate Color: Distinct, outline attached Distinct, outline attached Distinct, outline attached Wound Margin: Large (67-100%) Medium (34-66%) Small (1-33%) Granulation [name] mount: Red, Friable Pink Pink Granulation Quality: None Present (0%) Medium (34-66%) Large (67-100%) Necrotic [name] mount: Fat Layer (Subcutaneous Tissue): Yes Fat Layer (Subcutaneous Tissue): Yes Fat Layer (Subcutaneous Tissue): Yes Exposed Structures: Fascia: No Fascia: No Fascia: No Tendon: No Tendon: No Tendon: No Muscle: No Muscle: No Muscle: No Joint: No Joint: No Joint: No Bone: No Bone: No Bone: No Medium (34-66%) None None Epithelialization: [name] [name] Debridement - Excisional Debridement: Pre-procedure Verification/Time Out [name] [name] 11:17 Taken: [name] [name] Subcutaneous, Slough Tissue Debrided: [name] [name] Skin/Subcutaneous Tissue Level: [name] [name] 11.4 Debridement [name] (sq cm): rea [name] [name] Curette Instrument: [name] [name] Moderate Bleeding: [name] [name] Pressure Hemostasis [name] chieved: [name] [name] 0 Procedural Pain: [name] [name] 0 Post Procedural Pain: [name] [name] Procedure was tolerated well Debridement Treatment Response: [name] [name] 3.8x3x0.1 Post Debridement Measurements L x W x D (cm) [name] [name] 0.895 Post Debridement Volume: (cm) [name] [name] Debridement Procedures Performed: Treatment Notes Electronic Signature(s) Signed: 08/11/2021 4:30:53 PM By: Linton Ham MD Signed: 08/11/2021 6:09:57 PM By: Rhae Hammock RN Entered By: Dellia Nims,  Legrand Como on 08/11/2021 11:32:48 -------------------------------------------------------------------------------- Multi-Disciplinary Care Plan Details Patient Name: Date of Service: Klumpp, [name] [name] 08/11/2021 10:30 [name] M Medical Record Number: [id number] Patient Account Number: [id number] Date of Birth/Sex: Treating RN: Sep 28, 1986 (35 y.o. [name] Primary Care  Lakai Moree: [name], [name] Other Clinician: Referring Kunta Hilleary: Treating Connelly Spruell/Extender: [name]: [place] reviewed with physician Active Inactive Wound/Skin Impairment Nursing Diagnoses: Impaired tissue integrity Knowledge deficit related to ulceration/compromised skin integrity Goals: Patient/caregiver will verbalize understanding of skin care regimen Date Initiated: 01/05/2016 Target Resolution Date: 09/04/2021 Goal Status: Active Ulcer/skin breakdown will have [name] volume reduction of 30% by week 4 Date Initiated: 01/05/2016 Date Inactivated: 12/22/2017 Target Resolution Date: 01/19/2018 Unmet Reason: complex wounds, Goal Status: Unmet infection Interventions: Assess patient/caregiver ability to obtain necessary supplies Assess ulceration(s) every visit Provide education on ulcer and skin care Notes: 02/02/21: Complex Care, ongoing. Electronic Signature(s) Signed: 08/11/2021 5:42:07 PM By: Levan Hurst RN, BSN Entered By: Levan Hurst on 08/11/2021 16:44:32 -------------------------------------------------------------------------------- Pain Assessment Details Patient Name: Date of Service: Plantz, [name] [name] 08/11/2021 10:30 [name] M Medical Record Number: [id number] Patient Account Number: [id number] Date of Birth/Sex: Treating RN: January 06, 1986 (35 y.o. [name] Primary Care Reyann Troop: [place], [place] Other Clinician: Referring Wyona Neils: Treating Alexys Lobello/Extender: [name]: 292 Active Problems Location of Pain Severity and Description of Pain Patient Has Paino No Site Locations Pain Management and Medication Current Pain Management: Electronic Signature(s) Signed: 08/11/2021 5:42:07 PM By: Levan Hurst RN, BSN Entered By: Levan Hurst on 08/11/2021 10:57:25 -------------------------------------------------------------------------------- Patient/Caregiver  Education Details Patient Name: Date of Service: Markgraf, [name] Viviann Spare 9/20/2022andnbsp10:30 [name] M Medical Record Number: [id number] Patient Account Number: [id number] Date of Birth/Gender: Treating RN: 1986-05-05 (35 y.o. [name] Primary Care Physician: Janine Limbo Other Clinician: Referring Physician: Treating Physician/Extender: [name]: 292 Education Assessment Education Provided To: Patient Education Topics Provided Wound/Skin Impairment: Methods: Explain/Verbal Responses: State content correctly Electronic Signature(s) Signed: 08/11/2021 5:42:07 PM By: Levan Hurst RN, BSN Entered By: Levan Hurst on 08/11/2021 16:44:59 -------------------------------------------------------------------------------- Wound Assessment Details Patient Name: Date of Service: Winters, [name] [name] 08/11/2021 10:30 [name] M Medical Record Number: [id number] Patient Account Number: [id number] Date of Birth/Sex: Treating RN: [birth year] (35 y.o. [name] Primary Care Amante Fomby: [name], [name] Other Clinician: Referring Travelle Mcclimans: Treating Barba Solt/Extender: [name]: 292 Wound Status Wound Number: [id number] Primary Etiology: Pressure Ulcer Wound Location: Left Ischium Wound Status: Open Wounding Event: Gradually Appeared Comorbid History: Sleep Apnea, Hypertension, Paraplegia Date Acquired: 03/16/2020 Weeks Of Treatment: 73 Clustered Wound: Yes Wound Measurements Length: (cm) 0.5 Width: (cm) 0.5 Depth: (cm) 0.1 Clustered Quantity: 1 Area: (cm) 0.196 Volume: (cm) 0.02 % Reduction in Area: 89.1% % Reduction in Volume: 89% Epithelialization: Medium (34-66%) Tunneling: No Undermining: No Wound Description Classification: Category/Stage II Wound Margin: Distinct, outline attached Exudate Amount: Medium Exudate Type: Sanguinous Exudate Color: red Foul Odor After Cleansing: No Slough/Fibrino No Wound  Bed Granulation Amount: Large (67-100%) Exposed Structure Granulation Quality: Red, Friable Fascia Exposed: No Necrotic Amount: None Present (0%) Fat Layer (Subcutaneous Tissue) Exposed: Yes Tendon Exposed: No Muscle Exposed: No Joint Exposed: No Bone Exposed: No Treatment Notes Wound #[id number] (Ischium) Wound Laterality: Left Cleanser Soap and Water Discharge Instruction: May shower and wash wound with dial antibacterial soap and water prior to dressing change. Peri-Wound Care Triad Hydrophilic Wound Dressing Tube, 6 (oz) Discharge Instruction: Apply to periwound with each dressing change  Lotrisone Discharge Instruction: thin layer under triad cream. Topical Primary Dressing KerraCel Ag Gelling Fiber Dressing, 4x5 in (silver alginate) Discharge Instruction: Apply silver alginate to wound bed as instructed Secondary Dressing ComfortFoam Border, 4x4 in (silicone border) Discharge Instruction: Apply over primary dressing as directed. Secured With Compression Wrap Compression Stockings Environmental education officer) Signed: 08/11/2021 5:42:07 PM By: Levan Hurst RN, BSN Entered By: Levan Hurst on 08/11/2021 10:59:36 -------------------------------------------------------------------------------- Wound Assessment Details Patient Name: Date of Service: Vitiello, [name] [name] 08/11/2021 10:30 [name] M Medical Record Number: [id number] Patient Account Number: [id number] Date of Birth/Sex: Treating RN: 1986-10-25 (35 y.o. [name] Primary Care Teva Bronkema: [name], [name] Other Clinician: Referring Neera Teng: Treating Termaine Roupp/Extender: [name]: 292 Wound Status Wound Number: 51 Primary Etiology: Trauma, Other Wound Location: Left, Anterior Lower Leg Wound Status: Open Wounding Event: Trauma Comorbid History: Sleep Apnea, Hypertension, Paraplegia Date Acquired: 01/26/2021 Weeks Of Treatment: 25 Clustered Wound: No Wound  Measurements Length: (cm) 1.6 Width: (cm) 1 Depth: (cm) 0.1 Area: (cm) 1.257 Volume: (cm) 0.126 % Reduction in Area: -280.9% % Reduction in Volume: -281.8% Epithelialization: Small (1-33%) Tunneling: No Undermining: No Wound Description Classification: Full Thickness Without Exposed Support Structures Wound Margin: Distinct, outline attached Exudate Amount: Medium Exudate Type: Serosanguineous Exudate Color: red, brown Foul Odor After Cleansing: No Slough/Fibrino No Wound Bed Granulation Amount: Large (67-100%) Exposed Structure Granulation Quality: Red, Hyper-granulation, Friable Fascia Exposed: No Necrotic Amount: None Present (0%) Fat Layer (Subcutaneous Tissue) Exposed: Yes Tendon Exposed: No Muscle Exposed: No Joint Exposed: No Bone Exposed: No Treatment Notes Wound #51 (Lower Leg) Wound Laterality: Left, Anterior Cleanser Soap and Water Discharge Instruction: May shower and wash wound with dial antibacterial soap and water prior to dressing change. Peri-Wound Care Topical Primary Dressing KerraCel Ag Gelling Fiber Dressing, 4x5 in (silver alginate) Discharge Instruction: Apply silver alginate to wound bed as instructed Secondary Dressing ComfortFoam Border, 4x4 in (silicone border) Discharge Instruction: Apply over primary dressing as directed. Secured With Compression Wrap Compression Stockings Environmental education officer) Signed: 08/11/2021 5:42:07 PM By: Levan Hurst RN, BSN Entered By: Levan Hurst on 08/11/2021 11:00:01 -------------------------------------------------------------------------------- Wound Assessment Details Patient Name: Date of Service: Leamy, [name] [name] 08/11/2021 10:30 [name] M Medical Record Number: [id number] Patient Account Number: [id number] Date of Birth/Sex: Treating RN: [birth year] (35 y.o. [name] Primary Care Marithza Malachi: [name], [name] Other Clinician: Referring Caia Lofaro: Treating Amaia Lavallie/Extender: [name]: 292 Wound Status Wound Number: 52 Primary Etiology: Trauma, Other Wound Location: Right, Dorsal Foot Wound Status: Open Wounding Event: Gradually Appeared Comorbid History: Sleep Apnea, Hypertension, Paraplegia Date Acquired: 03/27/2021 Weeks Of Treatment: 19 Clustered Wound: No Wound Measurements Length: (cm) 2 Width: (cm) 4.5 Depth: (cm) 0.1 Area: (cm) 7.069 Volume: (cm) 0.707 % Reduction in Area: -275% % Reduction in Volume: -276.1% Epithelialization: None Tunneling: No Undermining: No Wound Description Classification: Full Thickness Without Exposed Support Structures Wound Margin: Distinct, outline attached Exudate Amount: Medium Exudate Type: Serosanguineous Exudate Color: red, brown Foul Odor After Cleansing: No Slough/Fibrino Yes Wound Bed Granulation Amount: Small (1-33%) Exposed Structure Granulation Quality: Pink Fascia Exposed: No Necrotic Amount: Large (67-100%) Fat Layer (Subcutaneous Tissue) Exposed: Yes Necrotic Quality: Adherent Slough Tendon Exposed: No Muscle Exposed: No Joint Exposed: No Bone Exposed: No Treatment Notes Wound #52 (Foot) Wound Laterality: Dorsal, Right Cleanser Soap and Water Discharge Instruction: May shower and wash wound with dial antibacterial soap and water prior to dressing change. Peri-Wound Care Topical Primary Dressing KerraCel Ag Gelling Fiber  Dressing, 4x5 in (silver alginate) Discharge Instruction: Apply silver alginate to wound bed as instructed Secondary Dressing Woven Gauze Sponge, Non-Sterile 4x4 in Discharge Instruction: Apply over primary dressing as directed. Secured With [employer], 4.5x3.1 (in/yd) Discharge Instruction: Secure with Kerlix as directed. Transpore Surgical Tape, 2x10 (in/yd) Discharge Instruction: Secure dressing with tape as directed. Compression Wrap Compression Stockings Add-Ons Electronic Signature(s) Signed: 08/11/2021 5:42:07 PM  By: Levan Hurst RN, BSN Entered By: Levan Hurst on 08/11/2021 11:00:31 -------------------------------------------------------------------------------- Wound Assessment Details Patient Name: Date of Service: Biscardi, [name] [name] 08/11/2021 10:30 [name] M Medical Record Number: [id number] Patient Account Number: [id number] Date of Birth/Sex: Treating RN: [birth year] (35 y.o. [name] Primary Care Riad Wagley: [name], [name] Other Clinician: Referring Liberato Stansbery: Treating Kelvin Burpee/Extender: [name]: 292 Wound Status Wound Number: 54 Primary Etiology: Pressure Ulcer Wound Location: Left, Distal Ischium Wound Status: Open Wounding Event: Pressure Injury Comorbid History: Sleep Apnea, Hypertension, Paraplegia Date Acquired: 05/11/2021 Weeks Of Treatment: 13 Clustered Wound: No Wound Measurements Length: (cm) 0.3 Width: (cm) 0.4 Depth: (cm) 0.1 Area: (cm) 0.094 Volume: (cm) 0.009 % Reduction in Area: 93.4% % Reduction in Volume: 93.6% Epithelialization: Medium (34-66%) Tunneling: No Wound Description Classification: Category/Stage III Wound Margin: Distinct, outline attached Exudate Amount: Medium Exudate Type: Sanguinous Exudate Color: red Foul Odor After Cleansing: No Slough/Fibrino No Wound Bed Granulation Amount: Large (67-100%) Exposed Structure Granulation Quality: Red, Friable Fascia Exposed: No Necrotic Amount: None Present (0%) Fat Layer (Subcutaneous Tissue) Exposed: Yes Tendon Exposed: No Muscle Exposed: No Joint Exposed: No Bone Exposed: No Treatment Notes Wound #54 (Ischium) Wound Laterality: Left, Distal Cleanser Soap and Water Discharge Instruction: May shower and wash wound with dial antibacterial soap and water prior to dressing change. Peri-Wound Care Triad Hydrophilic Wound Dressing Tube, 6 (oz) Discharge Instruction: Apply to periwound with each dressing change Lotrisone Discharge Instruction: thin  layer under triad cream. Topical Primary Dressing KerraCel Ag Gelling Fiber Dressing, 4x5 in (silver alginate) Discharge Instruction: Apply silver alginate to wound bed as instructed Secondary Dressing ComfortFoam Border, 4x4 in (silicone border) Discharge Instruction: Apply over primary dressing as directed. Secured With Compression Wrap Compression Stockings Environmental education officer) Signed: 08/11/2021 5:42:07 PM By: Levan Hurst RN, BSN Entered By: Levan Hurst on 08/11/2021 11:01:01 -------------------------------------------------------------------------------- Wound Assessment Details Patient Name: Date of Service: Kerschner, [name] [name] 08/11/2021 10:30 [name] M Medical Record Number: [id number] Patient Account Number: [id number] Date of Birth/Sex: Treating RN: 01-10-1986 (35 y.o. [name] Primary Care Skeeter Sheard: [name], [name] Other Clinician: Referring Estalene Bergey: Treating Izola Teague/Extender: [name]: 292 Wound Status Wound Number: 55 Primary Etiology: Neuropathic Ulcer-Non Diabetic Wound Location: Left T Great oe Wound Status: Open Wounding Event: Blister Comorbid History: Sleep Apnea, Hypertension, Paraplegia Date Acquired: 05/26/2021 Weeks Of Treatment: 11 Clustered Wound: No Wound Measurements Length: (cm) 0.5 Width: (cm) 1 Depth: (cm) 0.2 Area: (cm) 0.393 Volume: (cm) 0.079 % Reduction in Area: 49.9% % Reduction in Volume: 0% Epithelialization: None Tunneling: No Undermining: No Wound Description Classification: Full Thickness With Exposed Support Structures Wound Margin: Distinct, outline attached Exudate Amount: Medium Exudate Type: Serosanguineous Exudate Color: red, brown Foul Odor After Cleansing: No Slough/Fibrino Yes Wound Bed Granulation Amount: Medium (34-66%) Exposed Structure Granulation Quality: Pink Fascia Exposed: No Necrotic Amount: Medium (34-66%) Fat Layer (Subcutaneous Tissue)  Exposed: Yes Necrotic Quality: Adherent Slough Tendon Exposed: No Muscle Exposed: No Joint Exposed: No Bone Exposed: No Treatment Notes Wound #55 (Toe Great) Wound Laterality: Left Cleanser Soap and  Water Discharge Instruction: May shower and wash wound with dial antibacterial soap and water prior to dressing change. Peri-Wound Care Topical Primary Dressing KerraCel Ag Gelling Fiber Dressing, 4x5 in (silver alginate) Discharge Instruction: Apply silver alginate to wound bed as instructed Secondary Dressing Woven Gauze Sponge, Non-Sterile 4x4 in Discharge Instruction: Apply over primary dressing as directed. Secured With [employer], 4.5x3.1 (in/yd) Discharge Instruction: Secure with Kerlix as directed. Transpore Surgical Tape, 2x10 (in/yd) Discharge Instruction: Secure dressing with tape as directed. Compression Wrap Compression Stockings Add-Ons Electronic Signature(s) Signed: 08/11/2021 5:42:07 PM By: Levan Hurst RN, BSN Entered By: Levan Hurst on 08/11/2021 11:01:39 -------------------------------------------------------------------------------- Wound Assessment Details Patient Name: Date of Service: Pounders, [name] [name] 08/11/2021 10:30 [name] M Medical Record Number: [id number] Patient Account Number: [id number] Date of Birth/Sex: Treating RN: 09-Jun-1986 (35 y.o. [name] Primary Care Kidada Ging: [name], [name] Other Clinician: Referring Mallissa Lorenzen: Treating Jhanvi Drakeford/Extender: [name]: 292 Wound Status Wound Number: 56 Primary Etiology: Neuropathic Ulcer-Non Diabetic Wound Location: Left, Dorsal Foot Wound Status: Open Wounding Event: Gradually Appeared Comorbid History: Sleep Apnea, Hypertension, Paraplegia Date Acquired: 07/11/2021 Weeks Of Treatment: 3 Clustered Wound: No Wound Measurements Length: (cm) 3.8 Width: (cm) 3 Depth: (cm) 0.1 Area: (cm) 8.954 Volume: (cm) 0.895 % Reduction in Area: -44% %  Reduction in Volume: -43.9% Epithelialization: None Tunneling: No Undermining: No Wound Description Classification: Full Thickness Without Exposed Support Structures Wound Margin: Distinct, outline attached Exudate Amount: Medium Exudate Type: Purulent Exudate Color: yellow, brown, green Wound Bed Granulation Amount: Small (1-33%) Granulation Quality: Pink Necrotic Amount: Large (67-100%) Necrotic Quality: Adherent Slough Foul Odor After Cleansing: No Slough/Fibrino Yes Exposed Structure Fascia Exposed: No Fat Layer (Subcutaneous Tissue) Exposed: Yes Tendon Exposed: No Muscle Exposed: No Joint Exposed: No Bone Exposed: No Treatment Notes Wound #56 (Foot) Wound Laterality: Dorsal, Left Cleanser Soap and Water Discharge Instruction: May shower and wash wound with dial antibacterial soap and water prior to dressing change. Peri-Wound Care Topical Gentamicin Discharge Instruction: Apply directly to wound bed, under alginate Primary Dressing KerraCel Ag Gelling Fiber Dressing, 4x5 in (silver alginate) Discharge Instruction: Apply silver alginate to wound bed as instructed Secondary Dressing Woven Gauze Sponge, Non-Sterile 4x4 in Discharge Instruction: Apply over primary dressing as directed. Secured With [employer], 4.5x3.1 (in/yd) Discharge Instruction: Secure with Kerlix as directed. Transpore Surgical Tape, 2x10 (in/yd) Discharge Instruction: Secure dressing with tape as directed. Compression Wrap Compression Stockings Add-Ons Electronic Signature(s) Signed: 08/11/2021 5:42:07 PM By: Levan Hurst RN, BSN Entered By: Levan Hurst on 08/11/2021 11:02:08 -------------------------------------------------------------------------------- Cameron Details Patient Name: Date of Service: Wenrich, [name] [name] 08/11/2021 10:30 [name] M Medical Record Number: [id number] Patient Account Number: [id number] Date of Birth/Sex: Treating RN: December 24, 1985 (35 y.o. [name] Primary Care Trigg Delarocha: Parnell, Luverne Other Clinician: Referring Gyasi Hazzard: Treating Yusra Ravert/Extender: [name]: 292 Vital Signs Time Taken: 10:37 Temperature (F): 98.4 Height (in): 70 Pulse (bpm): 123 Weight (lbs): 216 Respiratory Rate (breaths/min): 16 Body Mass Index (BMI): 31 Blood Pressure (mmHg): 120/76 Reference Range: 80 - 120 mg / dl Electronic Signature(s) Signed: 08/11/2021 5:42:07 PM By: Levan Hurst RN, BSN Entered By: Levan Hurst on 08/11/2021 10:57:18

## 2021-08-11 NOTE — Progress Notes (Signed)
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 multiple dressing attempts. He also has areas in his first and second toes bilaterally in the anterior and posterior web space. I had been using Iodoflex here after a prolonged course of silver alginate with ketoconazole was ineffective [question tinea pedis] 07/14/17; patient arrives today with a very difficult adherent material over his left lateral lower leg wound. He also has surrounding erythema and poorly controlled edema. He was switched his Santyl last visit which the nurses are applying once during his doctor visit and once on a nurse visit. He was also reduced to 2 layer compression I'm not exactly sure of the issue here. 07/21/17; better surface today after 1 week of Iodoflex. Significant cellulitis that we treated last week also better. [Doxycycline] 07/28/17 better surface today with now 2 weeks of Iodoflex. Significant cellulitis treated with doxycycline. He has now completed the doxycycline and  he is back to his usual degree of chronic venous inflammation/stasis dermatitis. He reminds me he has had ablations surgery here 08/04/17; continued improvement with Iodoflex to the left lateral leg wound in terms of the surface of the  wound although the dimensions are better. 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 Problematic areas on the plantar aspect of the first second toe web space on the left and the dorsal aspect of the first second toe web space on the right. At one point I felt these were probably related to chronic fungal infections in treated him aggressively for this although we have not made any improvement here. 08/11/17; left lateral leg. Surface continues to improve with the Iodoflex although we are not seeing much improvement in overall wound dimensions. Areas on his plantar left foot and right foot show no improvement. In fact the right foot looks somewhat worse 08/18/17; left lateral leg. We changed to [facility] Blue last week after a prolonged course of Iodoflex which helps get the surface better. It appears that the wound with is improved. Continue with difficult areas on the left dorsal first second and plantar first second on the right 09/01/17; patient arrives in clinic today having had a temperature of 103 yesterday. He was seen in the ER and [facility]. The patient was concerned he could have cellulitis again in the right leg however they diagnosed him with a UTI and he is now on Keflex. He has a history of cellulitis which is been recurrent and difficult but this is been in the left leg, in the past 5 use doxycycline. He does in and out catheterizations at home which are risk factors for UTI 09/08/17; patient will be completing his Keflex this weekend. The erythema on the left leg is considerably better. He has a new wound today on the medial part of the right leg small superficial almost looks like a skin tear. He has worsening of the area on the right dorsal first and second toe. His major area on the left lateral leg is better. Using Hydrofera Blue on all areas 09/15/17; gradual reduction in width on the long wound in the left  lateral leg. No debridement required. He also has wounds on the plantar aspect of his left first second toe web space and on the dorsal aspect of the right first second toe web space. 09/22/17; there continues to be very gradual improvements in the dimensions of the left lateral leg wound. He hasn't round erythematous spot with might be pressure on his wheelchair. There is no evidence obviously of infection no purulence no warmth He has a dry scaled area on the plantar aspect of the left first second toe Improved area on the dorsal right first second toe. 09/29/17; left lateral leg wound continues to improve in dimensions mostly with an is still a fairly long but increasingly narrow wound. He has a dry scaled area on the plantar aspect of his left first second toe web space Increasingly concerning area on the dorsal right first second toe. In fact I am concerned today about possible cellulitis around this wound. The areas extending up his second toe and although there is deformities here almost appears to abut on the nailbed. 10/06/17; left lateral leg wound continues to make very gradual progress. Tissue culture I did from the right first second toe dorsal foot last time grew MRSA and enterococcus which was vancomycin sensitive. This was not sensitive to clindamycin  or doxycycline. He is allergic to Zyvox and sulfa we have therefore arrange for him to have dalvance infusion tomorrow. He is had this in the past and tolerated it well 10/20/17; left lateral leg wound continues to make decent progress. This is certainly reduced in terms of with there is advancing epithelialization.The cellulitis in the right foot looks better although he still has a deep wound in the dorsal aspect of the first second toe web space. Plantar left first toe web space on the left I think is making some progress 10/27/17; left lateral leg wound continues to make decent progress. Advancing epithelialization.using Hydrofera  Blue The right first second toe web space wound is better-looking using silver alginate Improvement in the left plantar first second toe web space. Again using silver alginate 11/03/17 left lateral leg wound continues to make decent progress albeit slowly. Using [facility] The right per second toe web space continues to be a very problematic looking punched out wound. I obtained a piece of tissue for deep culture I did extensively treated this for fungus. It is difficult to imagine that this is a pressure area as the patient states other than going outside he doesn't really wear shoes at home The left plantar first second toe web space looked fairly senescent. Necrotic edges. This required debridement change to [facility] Blue to all wound areas 11/10/17; left lateral leg wound continues to contract. Using Hydrofera Blue On the right dorsal first second toe web space dorsally. Culture I did of this area last week grew MRSA there is not an easy oral option in this patient was multiple antibiotic allergies or intolerances. This was only a rare culture isolate I'm therefore going to use Bactroban under silver alginate On the left plantar first second toe web space. Debridement is required here. This is also unchanged 11/17/17; left lateral leg wound continues to contract using Hydrofera Blue this is no longer the major issue. The major concern here is the right first second toe web space. He now has an open area going from dorsally to the plantar aspect. There is now wound on the inner lateral part of the first toe. Not a very viable surface on this. There is erythema spreading medially into the forefoot. No major change in the left first second toe plantar wound 11/24/17; left lateral leg wound continues to contract using Hydrofera Blue. Nice improvement today The right first second toe web space all of this looks a lot less angry than last week. I have given him clindamycin and topical Bactroban  for MRSA and terbinafine for the possibility of underlining tinea pedis that I could not control with ketoconazole. Looks somewhat better The area on the plantar left first second toe web space is weeping with dried debris around the wound 12/01/17; left lateral leg wound continues to contract he Hydrofera Blue. It is becoming thinner in terms of with nevertheless it is making good improvement. The right first second toe web space looks less angry but still a large necrotic-looking wounds starting on the plantar aspect of the right foot extending between the toes and now extensively on the base of the right second toe. I gave him clindamycin and topical Bactroban for MRSA anterior benefiting for the possibility of underlying tinea pedis. Not looking better today The area on the left first/second toe looks better. Debrided of necrotic debris 12/05/17* the patient was worked in urgently today because over the weekend he found blood on his incontinence bad when he woke up. He  was found to have an ulcer by his wife who does most of his wound care. He came in today for [place] to look at this. He has not had a history of wounds in his buttocks in spite of his paraplegia. 12/08/17; seen in follow-up today at his usual appointment. He was seen earlier this week and found to have a new wound on his buttock. We also follow him for wounds on the left lateral leg, left first second toe web space and right first second toe web space 12/15/17; we have been using Hydrofera Blue to the left lateral leg which has improved. The right first second toe web space has also improved. Left first second toe web space plantar aspect looks stable. The left buttock has worsened using Santyl. Apparently the buttock has drainage 12/22/17; we have been using Hydrofera Blue to the left lateral leg which continues to improve now 2 small wounds separated by normal skin. He tells [place] he had a fever up to 100 yesterday he is prone to UTIs but  has not noted anything different. He does in and out catheterizations. The area between the first and second toes today does not look good necrotic surface covered with what looks to be purulent drainage and erythema extending into the third toe. I had gotten this to something that I thought look better last time however it is not look good today. He also has a necrotic surface over the buttock wound which is expanded. I thought there might be infection under here so I removed a lot of the surface with a #5 curet though nothing look like it really needed culturing. He is been using Santyl to this area 12/27/17; his original wound on the left lateral leg continues to improve using Hydrofera Blue. I gave him samples of Baxdella although he was unable to take them out of fear for an allergic reaction ["lump in his throat"].the culture I did of the purulent drainage from his second toe last week showed both enterococcus and a set Enterobacter I was also concerned about the erythema on the bottom of his foot although paradoxically although this looks somewhat better today. 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 We've been using silver alginate here. They came up today that he sleeps in a recliner, I'm not sure why but I asked him to stop this 01/03/18; his original wound we've been using Hydrofera Blue is now separated into 2 areas. Ulcer on his left buttock is better he is off the recliner and sleeping in bed Finally both wound areas between his first and second toes also looks some better 01/10/18; his original wound on the left lateral leg is now separated into 2 wounds we've been using Hydrofera Blue Ulcer on his left buttock has some drainage. There is a small probing site going into muscle layer superiorly.using silver alginate -He arrives today with a deep tissue injury on the left heel The wound on the  dorsal aspect of his first second toe on the left looks a lot betterusing silver alginate ketoconazole The area on the first second toe web space on the right also looks a lot bette 01/17/18; his original wound on the left lateral leg continues to progress using Hydrofera Blue Ulcer on his left buttock also is smaller surface healthier except for a small probing site going into the muscle layer superiorly. 2.4 cm of tunneling in this area  DTI on his left heel we have only been offloading. Looks better than last week no threatened open no evidence of infection the wound on the dorsal aspect of the first second toe on the left continues to look like it's regressing we have only been using silver alginate and terbinafine orally The area in the first second toe web space on the right also looks to be a lot better using silver alginate and terbinafine I think this was prompted by tinea pedis 01/31/18; the patient was hospitalized in [place] last week apparently for a complicated UTI. He was discharged on cefepime he does in and out catheterizations. In the hospital he was discovered M I don't mild elevation of AST and ALT and the terbinafine was stopped.predictably the pressure ulcer on s his buttock looks betterusing silver alginate. The area on the left lateral leg also is better using Hydrofera Blue. The area between the first and second toes on the left better. First and second toes on the right still substantial but better. Finally the DTI on the left heel has held together and looks like it's resolving 02/07/18-he is here in follow-up evaluation for multiple ulcerations. He has new injury to the lateral aspect of the last issue a pressure ulcer, he states this is from adhesive removal trauma. He states he has tried multiple adhesive products with no success. All other ulcers appear stable. The left heel DTI is resolving. We will continue with same treatment plan and follow-up next week. 02/14/18;  follow-up for multiple areas. He has a new area last week on the lateral aspect of his pressure ulcer more over the posterior trochanter. The original pressure ulcer looks quite stable has healthy granulation. 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 Almost fully epithelialized on the original superior area using Hydrofera Blue DTI on the left heel has peeled off this week to reveal a small superficial wound under denuded skin and subcutaneous tissue Both areas between the first and second toes look better including nothing open on the left 02/21/18; The patient's wounds on his left ischial tuberosity and posterior left greater trochanter actually looked better. He has a large area of irritation around the area which I think is contact dermatitis. I am doubtful that this is fungal His original wound on the left lateral calf continues to improve we have been using Hydrofera Blue There is no open area in the left first second toe web space although there is a lot of thick callus The DTI on the left heel required debridement today of necrotic surface eschar and subcutaneous tissue using silver alginate Finally the area on the right first second toe webspace continues to contract using silver alginate and ketoconazole 02/28/18 Left ischial tuberosity wounds look better using silver alginate. Original wound on the left calf only has one small open area left using Hydrofera Blue DTI on the left heel required debridement mostly removing skin from around this wound surface. Using silver alginate The areas on the right first/second toe web space using silver alginate and ketoconazole 03/08/18 on evaluation today patient appears to be doing decently well as best I can tell in regard to his wounds. This is the first time that I have seen him as he generally is followed by Dr. Dellia Nims. With that being said  none of his wounds appear to be infected he does have an area where there is some skin covering  what appears to be a new wound on the left dorsal surface of his great toe. This is right at the nail bed. With that being said I do believe that debrided away some of the excess skin can be of benefit in this regard. Otherwise he has been tolerating the dressing changes without complication. 03/14/18; patient arrives today with the multiplicity of wounds that we are following. He has not been systemically unwell Original wound on the left lateral calf now only has 2 small open areas we've been using Hydrofera Blue which should continue The deep tissue injury on the left heel requires debridement today. We've been using silver alginate The left first second toe and the right first second toe are both are reminiscence what I think was tinea pedis. Apparently some of the callus Surface between the toes was removed last week when it started draining. Purulent drainage coming from the wound on the ischial tuberosity on the left. 03/21/18-He is here in follow-up evaluation for multiple wounds. 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 He tolerated debridement. The only change to last week's recommendations is to discontinue antifungal cream between toes. He will follow-up next week 03/28/18; following up for multiple wounds;Concern this week is streaking redness and swelling in the right foot. He is going to need antibiotics for this. 03/31/18; follow-up for right foot cellulitis. Streaking redness and swelling in the right foot on 03/28/18. 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 I put him on clindamycin 300 mg every 6 and brought him in for a quick check. He has an open wound between his first and second toes on the right foot as a potential source. 04/04/18; Right foot cellulitis is resolving he is completing  clindamycin. This is truly good news Left lateral calf wound which is initial wound only has one small open area inferiorly this is close to healing out. He has compression stockings. We will use Hydrofera Blue right down to the epithelialization of this Nonviable surface on the left heel which was initially pressure with a DTI. We've been using Hydrofera Blue. I'm going to switch this back to silver alginate Left first second toe/tinea pedis this looks better using silver alginate Right first second toe tinea pedis using silver alginate Large pressure ulcers on theLeft ischial tuberosity. Small wound here Looks better. I am uncertain about the surface over the large wound. Using silver alginate 04/11/18; Cellulitis in the right foot is resolved Left lateral calf wound which was his original wounds still has 2 tiny open areas remaining this is just about closed Nonviable surface on the left heel is better but still requires debridement Left first second toe/tinea pedis still open using silver alginate Right first second toe wound tinea pedis I asked him to go back to using ketoconazole and silver alginate Large pressure ulcers on the left ischial tuberosity this shear injury here is resolved. Wound is smaller. No evidence of infection using silver alginate 04/18/18; Patient arrives with an intense area of cellulitis in the right mid lower calf extending into the right heel area. Bright red and warm. Smaller area on the left anterior leg. He has a significant history of MRSA. He will definitely need antibioticsdoxycycline He now has 2 open areas on the left ischial tuberosity the original large wound and now a satellite area which I think was above his initial satellite areas. Not a wonderful surface on this satellite area surrounding erythema which  looks like pressure related. His left lateral calf wound again his original wound is just about closed Left heel pressure injury still requiring  debridement Left first second toe looks a lot better using silver alginate Right first second toe also using silver alginate and ketoconazole cream also looks better 04/20/18; the patient was worked in early today out of concerns with his cellulitis on the right leg. I had started him on doxycycline. This was 2 days ago. His wife was concerned about the swelling in the area. Also concerned about the left buttock. He has not been systemically unwell no fever chills. No nausea vomiting or diarrhea 04/25/18; the patient's left buttock wound is continued to deteriorate he is using Hydrofera Blue. He is still completing clindamycin for the cellulitis on the right leg although all of this looks better. 05/02/18 Left buttock wound still with a lot of drainage and a very tightly adherent fibrinous necrotic surface. He has a deeper area superiorly The left lateral calf wound is still closed DTI wound on the left heel necrotic surface especially the circumference using Iodoflex Areas between his left first second toe and right first second toe both look better. Dorsally and the right first second toe he had a necrotic surface although at smaller. In using silver alginate and ketoconazole. I did a culture last week which was a deep tissue culture of the reminiscence of the open wound on the right first second toe dorsally. This grew a few Acinetobacter and a few methicillin-resistant staph aureus. Nevertheless the area actually this week looked better. I didn't feel the need to specifically address this at least in terms of systemic antibiotics. 05/09/18; wounds are measuring larger more drainage per our intake. We are using Santyl covered with alginate on the large superficial buttock wounds, Iodosorb on the left heel, ketoconazole and silver alginate to the dorsal first and second toes bilaterally. 05/16/18; The area on his left buttock better in some aspects although the area superiorly over the ischial  tuberosity required an extensive debridement.using Santyl Left heel appears stable. Using Iodoflex The areas between his first and second toes are not bad however there is spreading erythema up the dorsal aspect of his left foot this looks like cellulitis again. He is insensate the erythema is really very brilliant.o Erysipelas He went to see an allergist days ago because he was itching part of this he had lab work done. This showed a white count of 15.1 with 70% neutrophils. Hemoglobin of 11.4 and a platelet count of 659,000. Last white count we had in Epic was a 2-1/2 years ago which was 25.9 but he was ill at the time. He was able to show me some lab work that was done by his primary physician the pattern is about the same. 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 But prompted me to go ahead and do x-rays of both feet and the pelvis rule out osteomyelitis. He also had a comprehensive metabolic panel this was reasonably normal his albumin was 3.7 liver function tests BUN/creatinine all normal 05/23/18; x-rays of both his feet from last week were negative for underlying pulmonary abnormality. The x-ray of his pelvis however showed mild irregularity in the left ischial which may represent some early osteomyelitis. The wound in the left ischial continues to get deeper clearly now exposed muscle. Each week necrotic surface material over this area. Whereas the rest of the wounds do not look so bad. The  left ischial wound we have been using Santyl and calcium alginate T the left heel surface necrotic debris using Iodoflex o The left lateral leg is still healed Areas on the left dorsal foot and the right dorsal foot are about the same. There is some inflammation on the left which might represent contact dermatitis, fungal dermatitis I am doubtful cellulitis although this looks better than last week 05/30/18; CT scan done at Hospital did not show any  osteomyelitis or abscess. Suggested the possibility of underlying cellulitis although I don't see a lot of evidence of this at the bedside The wound itself on the left buttock/upper thigh actually looks somewhat better. No debridement Left heel also looks better no debridement continue Iodoflex Both dorsal first second toe spaces appear better using Lotrisone. Left still required debridement 06/06/18; Intake reported some purulent looking drainage from the left gluteal wound. Using Santyl and calcium alginate Left heel looks better although still a nonviable surface requiring debridement The left dorsal foot first/second webspace actually expanding and somewhat deeper. I may consider doing a shave biopsy of this area Right dorsal foot first/second webspace appears stable to improved. Using Lotrisone and silver alginate to both these areas 06/13/18 Left gluteal surface looks better. Now separated in the 2 wounds. No debridement required. Still drainage. We'll continue silver alginate Left heel continues to look better with Iodoflex continue this for at least another week Of his dorsal foot wounds the area on the left still has some depth although it looks better than last week. We've been using Lotrisone and silver alginate 06/20/18 Left gluteal continues to look better healthy tissue Left heel continues to look better healthy granulation wound is smaller. He is using Iodoflex and his long as this continues continue the Iodoflex Dorsal right foot looks better unfortunately dorsal left foot does not. There is swelling and erythema of his forefoot. He had minor trauma to this several days ago but doesn't think this was enough to have caused any tissue injury. Foot looks like cellulitis, we have had this problem before 06/27/18 on evaluation today patient appears to be doing a little worse in regard to his foot ulcer. 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 Both of which would really be his only options for treating this infection. In the past he has been given and effusion of Orbactiv. This is done very well for him in the past again it's one time dosing IV antibiotic therapy. Subsequently I do believe this is something we're gonna need to see about doing at this point in time. Currently his other wounds seem to be doing somewhat better in my pinion I'm pretty happy in that regard. 07/03/18 on evaluation today patient's wounds actually appear to be doing fairly well. He has been tolerating the dressing changes without complication. All in all he seems to be showing signs of improvement. In regard to the antibiotics he has been dealing with infectious disease since I saw him last week as far as getting this scheduled. In the end [facility]'s [facility] this coming Friday. In the meantime he has been continuing to perform the dressing changes in such as previous. There does not appear to be any evidence of infection worsengin at this time. 07/10/18; Since I last saw this man 2 weeks ago  things have actually improved. IV antibiotics of resulted in less forefoot erythema although there is still some present. He is not systemically unwell Left buttock wounds 2 now have no depth there is increased epithelialization Using silver alginate Left heel still requires debridement using Iodoflex Left dorsal foot still with a sizable wound about the size of a border but healthy granulation Right dorsal foot still with a slitlike area using silver alginate 07/18/18; the patient's cellulitis in the left foot is improved in fact I think it is on its way to resolving. Left buttock wounds 2 both look better although the larger one has hypertension granulation we've been using silver alginate Left heel has some thick  circumferential redundant skin over the wound edge which will need to be removed today we've been using Iodoflex Left dorsal foot is still a sizable wound required debridement using silver alginate The right dorsal foot is just about closed only a small open area remains here 07/25/18; left foot cellulitis is resolved Left buttock wounds 2 both look better. Hyper-granulation on the major area Left heel as some debris over the surface but otherwise looks a healthier wound. Using silver collagen Right dorsal foot is just about closed 07/31/18; arrives with our intake nurse worried about purulent drainage from the buttock. We had hyper-granulation here last week His buttock wounds 2 continue to look better Left heel some debris over the surface but measuring smaller. Right dorsal foot unfortunately has openings between the toes Left foot superficial wound looks less aggravated. 08/07/18 Buttock wounds continue to look better although some of her granulation and the larger medial wound. silver alginate Left heel continues to look a lot better.silver collagen Left foot superficial wound looks less stable. Requires debridement. He has a new wound superficial area on the foot on the lateral dorsal foot. Right foot looks better using silver alginate without Lotrisone 08/14/2018; patient was in the ER last week diagnosed with a UTI. He is now on Cefpodoxime and Macrodantin. Buttock wounds continued to be smaller. Using silver alginate Left heel continues to look better using silver collagen Left foot superficial wound looks as though it is improving Right dorsal foot area is just about healed. 08/21/2018; patient is completed his antibiotics for his UTI. He has 2 open areas on the buttocks. There is still not closed although the surface looks satisfactory. Using silver alginate Left heel continues to improve using silver collagen The bilateral dorsal foot areas which are at the base of his first and second  toes/possible tinea pedis are actually stable on the left but worse on the right. The area on the left required debridement of necrotic surface. After debridement I obtained a specimen for PCR culture. The right dorsal foot which is been just about healed last week is now reopened 08/28/2018; culture done on the left dorsal foot showed coag negative staph both staph epidermidis and Lugdunensis. I think this is worthwhile initiating systemic treatment. I will use doxycycline given his long list of allergies. The area on the left heel slightly improved but still requiring debridement. The large wound on the buttock is just about closed whereas the smaller one is larger. Using silver alginate in this area 09/04/2018; patient is completing his doxycycline for the left foot although this continues to be a very difficult wound area with very adherent necrotic debris. We are using silver alginate to all his wounds right foot left foot and the small wounds on his buttock, silver collagen on the left heel. 09/11/2018; once  again this patient has intense erythema and swelling of the left forefoot. Lesser degrees of erythema in the right foot. He has a long list of allergies and intolerances. I will reinstitute doxycycline. 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 Using silver alginate Left heel also looks better using silver collagen Unfortunately both the areas on his feet look worse. The area on the left first second webspace is now gone through to the plantar part of his foot. The area on the left foot anteriorly is irritated with erythema and swelling in the forefoot. 09/25/2018 His wound on the left plantar heel looks better. Using silver collagen The area on the left buttock 2 small remnant areas. One is closed one is still open. Using silver alginate The areas between both his first and second toes look worse. This in spite of long-standing antifungal  therapy with ketoconazole and silver alginate which should have antifungal activity He has small areas around his original wound on the left calf one is on the bottom of the original scar tissue and one superiorly both of these are small and superficial but again given wound history in this site this is worrisome 10/02/2018 Left plantar heel continues to gradually contract using silver collagen Left buttock wound is unchanged using silver alginate The areas on his dorsal feet between his first and second toes bilaterally look about the same. I prescribed clindamycin ointment to see if we can address chronic staph colonization and also the underlying possibility of erythrasma The left lateral lower extremity wound is actually on the lateral part of his ankle. Small open area here. We have been using silver alginate 10/09/2018; Left plantar heel continues to look healthy and contract. No debridement is required Left buttock slightly smaller with a tape injury wound just below which was new this week Dorsal feet somewhat improved I have been using clindamycin Left lateral looks lower extremity the actual open area looks worse although a lot of this is epithelialized. I am going to change to silver collagen today He has a lot more swelling in the right leg although this is not pitting not red and not particularly warm there is a lot of spasm in the right leg usually indicative of people with paralysis of some underlying discomfort. We have reviewed his vascular status from 2017 he had a left greater saphenous vein ablation. I wonder about referring him back to vascular surgery if the area on the left leg continues to deteriorate. 10/16/2018 in today for follow-up and management of multiple lower extremity ulcers. His left Buttock wound is much lower smaller and almost closed completely. The wound to the left ankle has began to reopen with Epithelialization and some adherent slough. He has multiple  new areas to the left foot and leg. The left dorsal foot without much improvement. Wound present between left great webspace and 2nd toe. Erythema and edema present right leg. Right LE ultrasound obtained on 10/10/18 was negative for DVT . 10/23/2018; Left buttock is closed over. Still dry macerated skin but there is no open wound. I suspect this is chronic pressure/moisture Left lateral calf is quite a bit worse than when I saw this last. There is clearly drainage here he has macerated skin into the left plantar heel. We will change the primary dressing to alginate Left dorsal foot has some improvement in overall wound area. Still using clindamycin and silver alginate Right dorsal foot about the same as  the left using clindamycin and silver alginate The erythema in the right leg has resolved. He is DVT rule out was negative Left heel pressure area required debridement although the wound is smaller and the surface is health 10/26/2018 The patient came back in for his nurse check today predominantly because of the drainage coming out of the left lateral leg with a recent reopening of his original wound on the left lateral calf. He comes in today with a large amount of surrounding erythema around the wound extending from the calf into the ankle and even in the area on the dorsal foot. He is not systemically unwell. He is not febrile. Nevertheless this looks like cellulitis. We have been using silver alginate to the area. I changed him to a regular visit and I am going to prescribe him doxycycline. The rationale here is a long list of medication intolerances and a history of MRSA. I did not see anything that I thought would provide a valuable culture 10/30/2018 Follow-up from his appointment 4 days ago with really an extensive area of cellulitis in the left calf left lateral ankle and left dorsal foot. I put him on doxycycline. He has a long list of medication allergies which are true allergy reactions.  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 In any case he is a lot better today. The erythema especially in the anterior and lateral left calf is better. He still has left ankle erythema. He also is complaining about increasing edema in the right leg we have only been using Kerlix Coban and he has been doing the wraps at home. Finally he has a spotty rash on the medial part of his upper left calf which looks like folliculitis or perhaps wrap occlusion type injury. Small superficial macules not pustules 11/06/18 patient arrives today with again a considerable degree of erythema around the wound on the left lateral calf extending into the dorsal ankle and dorsal foot. This is a lot worse than when I saw this last week. He is on doxycycline really with not a lot of improvement. He has not been systemically unwell Wounds on the; left heel actually looks improved. Original area on the left foot and proximity to the first and second toes looks about the same. He has superficial areas on the dorsal foot, anterior calf and then the reopening of his original wound on the left lateral calf which looks about the same The only area he has on the right is the dorsal webspace first and second which is smaller. He has a large area of dry erythematous skin on the left buttock small open area here. 11/13/2018; the patient arrives in much better condition. The erythema around the wound on the left lateral calf is a lot better. Not sure whether this was the clindamycin or the TCA and ketoconazole or just in the improvement in edema control [stasis dermatitis]. In any case this is a lot better. The area on the left heel is very small and just about resolved using silver collagen we have been using silver alginate to the areas on his dorsal feet 11/20/2018; his wounds include the left lateral calf, left heel, dorsal aspects of both feet just proximal to  the first second webspace. He is stable to slightly improved. I did not think any changes to his dressings were going to be necessary 11/27/2018 he has a reopening on the left buttock which is surrounded by what  looks like tinea or perhaps some other form of dermatitis. The area on the left dorsal foot has some erythema around it I have marked this area but I am not sure whether this is cellulitis or not. Left heel is not closed. Left calf the reopening is really slightly longer and probably worse 1/13; in general things look better and smaller except for the left dorsal foot. Area on the left heel is just about closed, left buttock looks better only a small wound remains in the skin looks better [using Lotrisone] 1/20; the area on the left heel only has a few remaining open areas here. Left lateral calf about the same in terms of size, left dorsal foot slightly larger right lateral foot still not closed. The area on the left buttock has no open wound and the surrounding skin looks a lot better 1/27; the area on the left heel is closed. Left lateral calf better but still requiring extensive debridements. The area on his left buttock is closed. He still has the open areas on the left dorsal foot which is slightly smaller in the right foot which is slightly expanded. We have been using Iodoflex on these areas as well 2/3; left heel is closed. Left lateral calf still requiring debridement using Iodoflex there is no open area on his left buttock however he has dry scaly skin over a large area of this. Not really responding well to the Lotrisone. Finally the areas on his dorsal feet at the level of the first second webspace are slightly smaller on the right and about the same on the left. Both of these vigorously debrided with Anasept and gauze 2/10; left heel remains closed he has dry erythematous skin over the left buttock but there is no open wound here. Left lateral leg has come in and with.  Still requiring debridement we have been using Iodoflex here. Finally the area on the left dorsal foot and right dorsal foot are really about the same extremely dry callused fissured areas. He does not yet have a dermatology appointment 2/17; left heel remains closed. He has a new open area on the left buttock. The area on the left lateral calf is bigger longer and still covered in necrotic debris. No major change in his foot areas bilaterally. I am awaiting for a dermatologist to look on this. We have been using ketoconazole I do not know that this is been doing any good at all. 2/24; left heel remains closed. The left buttock wound that was new reopening last week looks better. The left lateral calf appears better also although still requires debridement. The major area on his foot is the left first second also requiring debridement. We have been putting Prisma on all wounds. I do not believe that the ketoconazole has done too much good for his feet. He will use Lotrisone I am going to give him a 2-week course of terbinafine. We still do not have a dermatology appointment 3/2 left heel remains closed however there is skin over bone in this area I pointed this out to him today. The left buttock wound is epithelialized but still does not look completely stable. The area on the left leg required debridement were using silver collagen here. With regards to his feet we changed to Lotrisone last week and silver alginate. 3/9; left heel remains closed. Left buttock remains closed. The area on the right foot is essentially closed. The left foot remains unchanged. Slightly smaller on the left lateral calf. Using  silver collagen to both of these areas 3/16-Left heel remains closed. Area on right foot is closed. Left lateral calf above the lateral malleolus open wound requiring debridement with easy bleeding. Left dorsal wound proximal to first toe also debrided. Left ischial area open new. Patient has been  using Prisma with wrapping every 3 days. Dermatology appointment is apparently tomorrow.Patient has completed his terbinafine 2-week course with some apparent improvement according to him, there is still flaking and dry skin in his foot on the left 3/23; area on the right foot is reopened. The area on the left anterior foot is about the same still a very necrotic adherent surface. He still has the area on the left leg and reopening is on the left buttock. He apparently saw dermatology although I do not have a note. According to the patient who is usually fairly well informed they did not have any good ideas. Put him on oral terbinafine which she is been on before. 3/30; using silver collagen to all wounds. Apparently his dermatologist put him on doxycycline and rifampin presumably some culture grew staph. I do not have this result. He remains on terbinafine although I have used terbinafine on him before 4/6; patient has had a fairly substantial reopening on the right foot between the first and second toes. He is finished his terbinafine and I believe is on doxycycline and rifampin still as prescribed by dermatology. We have been using silver collagen to all his wounds although the patient reports that he thinks silver alginate does better on the wounds on his buttock. 4/13; the area on his left lateral calf about the same size but it did not require debridement. Left dorsal foot just proximal to the webspace between the first and second toes is about the same. Still nonviable surface. I note some superficial bronze discoloration of the dorsal part of his foot Right dorsal foot just proximal to the first and second toes also looks about the same. I still think there may be the same discoloration I noted above on the left Left buttock wound looks about the same 4/20; left lateral calf appears to be gradually contracting using silver collagen. He remains on erythromycin empiric treatment for possible  erythrasma involving his digital spaces. The left dorsal foot wound is debrided of tightly adherent necrotic debris and really cleans up quite nicely. The right area is worse with expansion. I did not debride this it is now over the base of the second toe The area on his left buttock is smaller no debridement is required using silver collagen 5/4; left calf continues to make good progress. He arrives with erythema around the wounds on his dorsal foot which even extends to the plantar aspect. Very concerning for coexistent infection. He is finished the erythromycin I gave him for possible erythrasma this does not seem to have helped. The area on the left foot is about the same base of the dorsal toes Is area on the buttock looks improved on the left 5/11; left calf and left buttock continued to make good progress. Left foot is about the same to slightly improved. Major problem is on the right foot. He has not had an x-ray. Deep tissue culture I did last week showed both Enterobacter and E. coli. I did not change the doxycycline I put him on empirically although neither 1 of these were plated to doxycycline. He arrives today with the erythema looking worse on both the dorsal and plantar foot. Macerated skin on the bottom  of the foot. he has not been systemically unwell 5/18-Patient returns at 1 week, left calf wound appears to be making some progress, left buttock wound appears slightly worse than last time, left foot wound looks slightly better, right foot redness is marginally better. X-ray of both feet show no air or evidence of osteomyelitis. Patient is finished his Omnicef and terbinafine. He continues to have macerated skin on the bottom of the left foot as well as right 5/26; left calf wound is better, left buttock wound appears to have multiple small superficial open areas with surrounding macerated skin. X-rays that I did last time showed no evidence of osteomyelitis in either foot. He is  finished cefdinir and doxycycline. I do not think that he was on terbinafine. He continues to have a large superficial open area on the right foot anterior dorsal and slightly between the first and second toes. I did send him to dermatology 2 months ago or so wondering about whether they would do a fungal scraping. I do not believe they did but did do a culture. We have been using silver alginate to the toe areas, he has been using antifungals at home topically either ketoconazole or Lotrisone. We are using silver collagen on the left foot, silver alginate on the right, silver collagen on the left lateral leg and silver alginate on the left buttock 6/1; left buttock area is healed. We have the left dorsal foot, left lateral leg and right dorsal foot. We are using silver alginate to the areas on both feet and silver collagen to the area on his left lateral calf 6/8; the left buttock apparently reopened late last week. He is not really sure how this happened. He is tolerating the terbinafine. Using silver alginate to all wounds 6/15; left buttock wound is larger than last week but still superficial. Came in the clinic today with a report of purulence from the left lateral leg I did not identify any infection Both areas on his dorsal feet appear to be better. He is tolerating the terbinafine. Using silver alginate to all wounds 6/22; left buttock is about the same this week, left calf quite a bit better. His left foot is about the same however he comes in with erythema and warmth in the right forefoot once again. Culture that I gave him in the beginning of May showed Enterobacter and E. coli. I gave him doxycycline and things seem to improve although neither 1 of these organisms was specifically plated. 6/29; left buttock is larger and dry this week. Left lateral calf looks to me to be improved. Left dorsal foot also somewhat improved right foot completely unchanged. The erythema on the right foot is  still present. He is completing the Ceftin dinner that I gave him empirically [see discussion above.) 7/6 - All wounds look to be stable and perhaps improved, the left buttock wound is slightly smaller, per patient bleeds easily, completed ceftin, the right foot redness is less, he is on terbinafine 7/13; left buttock wound about the same perhaps slightly narrower. Area on the left lateral leg continues to narrow. Left dorsal foot slightly smaller right foot about the same. We are using silver alginate on the right foot and Hydrofera Blue to the areas on the left. Unna boot on the left 2 layer compression on the right 7/20; left buttock wound absolutely the same. Area on lateral leg continues to get better. Left dorsal foot require debridement as did the right no major change in the 7/27;  left buttock wound the same size necrotic debris over the surface. The area on the lateral leg is closed once again. His left foot looks better right foot about the same although there is some involvement now of the posterior first second toe area. He is still on terbinafine which I have given him for a month, not certain a centimeter major change 06/25/19-All wounds appear to be slightly improved according to report, left buttock wound looks clean, both foot wounds have minimal to no debris the right dorsal foot has minimal slough. We are using Hydrofera Blue to the left and silver alginate to the right foot and ischial wound. 8/10-Wounds all appear to be around the same, the right forefoot distal part has some redness which was not there before, however the wound looks clean and small. Ischial wound looks about the same with no changes 8/17; his wound on the left lateral calf which was his original chronic venous insufficiency wound remains closed. Since I last saw him the areas on the left dorsal foot right dorsal foot generally appear better but require debridement. The area on his left initial tuberosity appears  somewhat larger to me perhaps hyper granulated and bleeds very easily. We have been using Hydrofera Blue to the left dorsal foot and silver alginate to everything else 8/24; left lateral calf remains closed. The areas on his dorsal feet on the webspace of the first and second toes bilaterally both look better. The area on the left buttock which is the pressure ulcer stage II slightly smaller. I change the dressing to Hydrofera Blue to all areas 8/31; left lateral calf remains closed. The area on his dorsal feet bilaterally look better. Using Hydrofera Blue. Still requiring debridement on the left foot. No change in the left buttock pressure ulcers however 9/14; left lateral calf remains closed. Dorsal feet look quite a bit better than 2 weeks ago. Flaking dry skin also a lot better with the ammonium lactate I gave him 2 weeks ago. The area on the left buttock is improved. He states that his Roho cushion developed a leak and he is getting a new one, in the interim he is offloading this vigorously 9/21; left calf remains closed. Left heel which was a possible DTI looks better this week. He had macerated tissue around the left dorsal foot right foot looks satisfactory and improved left buttock wound. I changed his dressings to his feet to silver alginate bilaterally. Continuing Hydrofera Blue on the left buttock. 9/28 left calf remains closed. Left heel did not develop anything [possible DTI] dry flaking skin on the left dorsal foot. Right foot looks satisfactory. Improved left buttock wound. We are using silver alginate on his feet Hydrofera Blue on the buttock. I have asked him to go back to the Lotrisone on his feet including the wounds and surrounding areas 10/5; left calf remains closed. The areas on the left and right feet about the same. A lot of this is epithelialized however debris over the remaining open areas. He is using Lotrisone and silver alginate. The area on the left buttock using  Hydrofera Blue 10/26. Patient has been out for 3 weeks secondary to Covid concerns. He tested negative but I think his wife tested positive. He comes in today with the left foot substantially worse, right foot about the same. Even more concerning he states that the area on his left buttock closed over but then reopened and is considerably deeper in one aspect than it was before [stage III  wound] 11/2; left foot really about the same as last week. Quarter sized wound on the dorsal foot just proximal to the first second toes. Surrounding erythema with areas of denuded epithelium. This is not really much different looking. Did not look like cellulitis this time however. Right foot area about the same.. We have been using silver alginate alginate on his toes Left buttock still substantial irritated skin around the wound which I think looks somewhat better. We have been using Hydrofera Blue here. 11/9; left foot larger than last week and a very necrotic surface. Right foot I think is about the same perhaps slightly smaller. Debris around the circumference also addressed. Unfortunately on the left buttock there is been a decline. Satellite lesions below the major wound distally and now a an additional one posteriorly we have been using Hydrofera Blue but I think this is a pressure issue 11/16; left foot ulcer dorsally again a very adherent necrotic surface. Right foot is about the same. Not much change in the pressure ulcer on his left buttock. 11/30; left foot ulcer dorsally basically the same as when I saw him 2 weeks ago. Very adherent fibrinous debris on the wound surface. Patient reports a lot of drainage as well. 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 We have been using Iodoflex, patient changed back to alginate because of the drainage. Area on his right dorsal foot really looks benign with a healthier surface certainly a lot better than on the left.  Left buttock wounds all improved using Hydrofera Blue 12/7; left dorsal foot again no improvement. Tightly adherent debris. PCR culture I did last week only showed likely skin contaminant. I have gone ahead and done a punch biopsy of this which is about the last thing in terms of investigations I can think to do. He has known venous insufficiency and venous hypertension and this could be the issue here. The area on the right foot is about the same left buttock slightly worse according to our intake nurse secondary to [facility] Blue sticking to the wound 12/14; biopsy of the left foot that I did last time showed changes that could be related to wound healing/chronic stasis dermatitis phenomenon no neoplasm. We have been using silver alginate to both feet. I change the one on the left today to Sorbact and silver alginate to his other 2 wounds 12/28; the patient arrives with the following problems; Major issue is the dorsal left foot which continues to be a larger deeper wound area. Still with a completely nonviable surface Paradoxically the area mirror image on the right on the right dorsal foot appears to be getting better. He had some loss of dry denuded skin from the lower part of his original wound on the left lateral calf. Some of this area looked a little vulnerable and for this reason we put him in wrap that on this side this week The area on his left buttock is larger. He still has the erythematous circular area which I think is a combination of pressure, sweat. This does not look like cellulitis or fungal dermatitis 11/26/2019; -Dorsal left foot large open wound with depth. Still debris over the surface. Using Sorbact The area on the dorsal right foot paradoxically has closed over He has a reopening on the left ankle laterally at the base of his original wound that extended up into the calf. This appears clean. The left buttock wound is smaller but with very adherent necrotic debris  over the  surface. We have been using silver alginate here as well The patient had arterial studies done in 2017. He had biphasic waveforms at the dorsalis pedis and posterior tibial bilaterally. ABI in the left was 1.17. Digit waveforms were dampened. He has slight spasticity in the great toes I do not think a TBI would be possible 1/11; the patient comes in today with a sizable reopening between the first and second toes on the right. This is not exactly in the same location where we have been treating wounds previously. According to our intake nurse this was actually fairly deep but 0.6 cm. The area on the left dorsal foot looks about the same the surface is somewhat cleaner using Sorbact, his MRI is in 2 days. We have not managed yet to get arterial studies. The new reopening on the left lateral calf looks somewhat better using alginate. The left buttock wound is about the same using alginate 1/18; the patient had his ARTERIAL studies which were quite normal. ABI in the right at 1.13 with triphasic/biphasic waveforms on the left ABI 1.06 again with triphasic/biphasic waveforms. It would not have been possible to have done a toe brachial index because of spasticity. We have been using Sorbac to the left foot alginate to the rest of his wounds on the right foot left lateral calf and left buttock 1/25; arrives in clinic with erythema and swelling of the left forefoot worse over the first MTP area. This extends laterally dorsally and but also posteriorly. Still has an area on the left lateral part of the lower part of his calf wound it is eschared and clearly not closed. Area on the left buttock still with surrounding irritation and erythema. Right foot surface wound dorsally. The area between the right and first and second toes appears better. 2/1; The left foot wound is about the same. Erythema slightly better I gave him a week of doxycycline empirically Right foot wound is more extensive extending between  the toes to the plantar surface Left lateral calf really no open surface on the inferior part of his original wound however the entire area still looks vulnerable Absolutely no improvement in the left buttock wound required debridement. 2/8; the left foot is about the same. Erythema is slightly improved I gave him clindamycin last week. Right foot looks better he is using Lotrimin and silver alginate He has a breakdown in the left lateral calf. Denuded epithelium which I have removed Left buttock about the same were using Hydrofera Blue 2/15; left foot is about the same there is less surrounding erythema. Surface still has tightly adherent debris which I have debriding however not making any progress Right foot has a substantial wound on the medial right second toe between the first and second webspace. Still an open area on the left lateral calf distal area. Buttock wound is about the same 2/22; left foot is about the same less surrounding erythema. Surface has adherent debris. Polymen Ag Right foot area significant wound between the first and second toes. We have been using silver alginate here Left lateral leg polymen Ag at the base of his original venous insufficiency wound Left buttock some improvement here 3/1; Right foot is deteriorating in the first second toe webspace. Larger and more substantial. We have been using silver alginate. Left dorsal foot about the same markedly adherent surface debris using PolyMem Ag Left lateral calf surface debris using PolyMem AG Left buttock is improved again using PolyMem Ag. He is completing  his terbinafine. The erythema in the foot seems better. He has been on this for 2 weeks 3/8; no improvement in any wound area in fact he has a small open area on the dorsal midfoot which is new this week. He has not gotten his foot x-rays yet 3/15; his x-rays were both negative for osteomyelitis of both feet. No major change in any of his wounds on the  extremities however his buttock wounds are better. We have been using polymen on the buttocks, left lower leg. Iodoflex on the left foot and silver alginate on the right 3/22; arrives in clinic today with the 2 major issues are the improvement in the left dorsal foot wound which for once actually looks healthy with a nice healthy wound surface without debridement. Using Iodoflex here. Unfortunately on the left lateral calf which is in the distal part of his original wound he came to the clinic here for there was purulent drainage noted some increased breakdown scattered around the original area and a small area proximally. We we are using polymen here will change to silver alginate today. His buttock wound on the left is better and I think the area on the right first second toe webspace is also improved 3/29; left dorsal foot looks better. Using Iodoflex. Left ankle culture from deterioration last time grew E. coli, Enterobacter and Enterococcus. I will give him a course of cefdinir although that will not cover Enterococcus. The area on the right foot in the webspace of the first and second toe lateral first toe looks better. The area on his buttock is about healed Vascular appointment is on April 21. This is to look at his venous system vis--vis continued breakdown of the wounds on the left including the left lateral leg and left dorsal foot he. He has had previous ablations on this side 4/5; the area between the right first and second toes lateral aspect of the first toe looks better. Dorsal aspect of the left first toe on the left foot also improved. Unfortunately the left lateral lower leg is larger and there is a second satellite wound superiorly. The usual superficial abrasions on the left buttock overall better but certainly not closed 4/12; the area between the right first and second toes is improved. Dorsal aspect of the left foot also slightly smaller with a vibrant healthy looking  surface. No real change in the left lateral leg and the left buttock wound is healed He has an unaffordable co-pay for Apligraf. Appointment with vein and vascular with regards to the left leg venous part of the circulation is on 4/21 4/19; we continue to see improvement in all wound areas. Although this is minor. He has his vascular appointment on 4/21. The area on the left buttock has not reopened although right in the center of this area the skin looks somewhat threatened 4/26; the left buttock is unfortunately reopened. In general his left dorsal foot has a healthy surface and looks somewhat smaller although it was not measured as such. The area between his first and second toe webspace on the right as a small wound against the first toe. The patient saw vascular surgery. The real question I was asking was about the small saphenous vein on the left. He has previously ablated left greater saphenous vein. Nothing further was commented on on the left. Right greater saphenous vein without reflux at the saphenofemoral junction or proximal thigh there was no indication for ablation of the right greater saphenous vein duplex was  negative for DVT bilaterally. They did not think there was anything from a vascular surgery point of view that could be offered. They ABIs within normal limits 5/3; only small open area on the left buttock. The area on the left lateral leg which was his original venous reflux is now 2 wounds both which look clean. We are using Iodoflex on the left dorsal foot which looks healthy and smaller. He is down to a very tiny area between the right first and second toes, using silver alginate 5/10; all of his wounds appear better. We have much better edema control in 4 layer compression on the left. This may be the factor that is allowing the left foot and left lateral calf to heal. He has external compression garments at home 04/14/20-All of his wounds are progressing well, the left  forefoot is practically closed, left ischium appears to be about the same, right toe webspace is also smaller. The left lateral leg is about the same, continue using Hydrofera Blue to this, silver alginate to the ischium, Iodoflex to the toe space on the right 6/7; most of his wounds outside of the left buttock are doing well. The area on the left lateral calf and left dorsal foot are smaller. The area on the right foot in between the first and second toe webspace is barely visible although he still says there is some drainage here is the only reason I did not heal this out. Unfortunately the area on the left buttock almost looks like he has a skin tear from tape. He has open wound and then a large flap of skin that we are trying to get adherence over an area just next to the remaining wound 6/21; 2 week follow-up. I believe is been here for nurse visits. Miraculously the area between his first and second toes on the left dorsal foot is closed over. Still open on the right first second web space. The left lateral calf has 2 open areas. Distally this is more superficial. The proximal area had a little more depth and required debridement of adherent necrotic material. His buttock wound is actually larger we have been using silver alginate here 6/28; the patient's area on the left foot remains closed. Still open wet area between the first and second toes on the right and also extending into the plantar aspect. We have been using silver alginate in this location. He has 2 areas on the left lower leg part of his original long wounds which I think are better. We have been using Hydrofera Blue here. Hydrofera Blue to the left buttock which is stable 7/12; left foot remains closed. Left ankle is closed. May be a small area between his right first and second toes the only truly open area is on the left buttock. We have been using Hydrofera Blue here 7/19; patient arrives with marked deterioration especially in  the left foot and ankle. We did not put him in a compression wrap on the left last week in fact he wore his juxta lite stockings on either side although he does not have an underlying stocking. He has a reopening on the left dorsal foot, left lateral ankle and a new area on the right dorsal ankle. More worrisome is the degree of erythema on the left foot extending on the lateral foot into the lateral lower leg on the left 7/26; the patient had erythema and drainage from the lateral left ankle last week. Culture of this grew MRSA resistant to doxycycline  and clindamycin which are the 2 antibiotics we usually use with this patient who has multiple antibiotic allergies including linezolid, trimethoprim sulfamethoxazole. I had give him an empiric doxycycline and he comes in the area certainly looks somewhat better although it is blotchy in his lower leg. He has not been systemically unwell. He has had areas on the left dorsal foot which is a reopening, chronic wounds on the left lateral ankle. Both of these I think are secondary to chronic venous insufficiency. The area between his first and second toes is closed as far as I can tell. He had a new wrap injury on the right dorsal ankle last week. Finally he has an area on the left buttock. We have been using silver alginate to everything except the left buttock we are using Hydrofera Blue 06/30/20-Patient returns at 1 week, has been given a sample dose pack of NUZYRA which is a tetracycline derivative [omadacycline], patient has completed those, we have been using silver alginate to almost all the wounds except the left ischium where we are using Hydrofera Blue all of them look better 8/16; since I last saw the patient he has been doing well. The area on the left buttock, left lateral ankle and left foot are all closed today. He has completed the [place] I gave him last time and tolerated this well. He still has open areas on the right dorsal ankle and in the  right first second toe area which we are using silver alginate. 8/23; we put him in his bilateral external compression stockings last week as he did not have anything open on either leg except for concerning area between the right first and second toe. He comes in today with an area on the left dorsal foot slightly more proximal than the original wound, the left lateral foot but this is actually a continuation of the area he had on the left lateral ankle from last time. As well he is opened up on the left buttock again. 8/30; comes in today with things looking a lot better. The area on the left lower ankle has closed down as has the left foot but with eschar in both areas. The area on the dorsal right ankle is also epithelialized. Very little remaining of the left buttock wound. We have been using silver alginate on all wound areas 9/13; the area in the first second toe webspace on the right has fully epithelialized. He still has some vulnerable epithelium on the right and the ankle and the dorsal foot. He notes weeping. He is using his juxta lite stocking. On the left again the left dorsal foot is closed left lateral ankle is closed. We went to the juxta lite stocking here as well. Still vulnerable in the left buttock although only 2 small open areas remain here 9/27; 2-week follow-up. We did not look at his left leg but the patient says everything is closed. He is a bit disturbed by the amount of edema in his left foot he is using juxta lite stockings but asking about over the toes stockings which would be 30/40, will talk to him next time. According to him there is no open wound on either the left foot or the left ankle/calf He has an open area on the dorsal right calf which I initially point a wrap injury. He has superficial remaining wound on the left ischial tuberosity been using silver alginate although he says this sticks to the wound 10/5; we gave him 2-week follow-up but he  called yesterday  expressing some concerns about his right foot right ankle and the left buttock. He came in early. There is still no open areas on the left leg and that still in his juxta lite stocking 10/11; he only has 1 small area on the left buttock that remains measuring millimeters 1 mm. Still has the same irritated skin in this area. We recommended zinc oxide when this eventually closes and pressure relief is meticulously is he can do this. He still has an area on the dorsal part of his right first through third toes which is a bit irritated and still open and on the dorsal ankle near the crease of the ankle. We have been using silver alginate and using his own stocking. He has nothing open on the left leg or foot 10/25; 2-week follow-up. Not nearly as good on the left buttock as I was hoping. For open areas with 5 looking threatened small. He has the erythematous irritated chronic skin in this area. 1 area on the right dorsal ankle. He reports this area bleeds easily Right dorsal foot just proximal to the base of his toes We have been using silver alginate. 11/8; 2-week follow-up. Left buttock is about the same although I do not think the wounds are in the same location we have been using silver alginate. I have asked him to use zinc oxide on the skin around the wounds. He still has a small area on the right dorsal ankle he reports this bleeds easily Right dorsal foot just proximal to the base of the toes does not have anything open although the skin is very dry and scaly He has a new opening on the nailbed of the left great toe. Nothing on the left ankle 11/29; 3-week follow-up. Left buttock has 2 open areas. And washing of these wounds today started bleeding easily. Suggesting very friable tissue. We have been using silver alginate. Right dorsal ankle which I thought was initially a wrap injury we have been using silver alginate. Nothing open between the toes that I can see. He states the area on the left  dorsal toe nailbed healed after the last visit in 2 or 3 days 12/13; 3-week follow-up. His left buttock now has 3 open areas but the original 2 areas are smaller using polymen here. Surrounding skin looks better. The right dorsal ankle is closed. He has a small opening on the right dorsal foot at the level of the third toe. In general the skin looks better here. He is wearing his juxta lite stocking on the left leg says there is nothing open 11/24/2020; 3 weeks follow-up. His left buttock still has the 3 open areas. We have been using polymen but due to lack of response he changed to [facility] area. Surrounding skin is dry erythematous and irritated looking. There is no evidence of infection either bacterial or fungal however there is loss of surface epithelium He still has very dry skin in his foot causing irritation and erythema on the dorsal part of his toes. This is not responded to prolonged courses of antifungal simply looks dry and irritated 1/24; left buttock area still looks about the same he was unable to find the triad ointment that we had suggested. The area on the right lower leg just above the dorsal ankle has reopened and the areas on the right foot between the first second and second third toes and scaling on the bottom of the foot has been about the same for quite  some time now. been using silver alginate to all wound areas 2/7; left buttock wound looked quite good although not much smaller in terms of surface area surrounding skin looks better. Only a few dry flaking areas on the right foot in between the first and second toes the skin generally looks better here [ammonium lactate]. Finally the area on the right dorsal ankle is closed 2/21; There is no open area on the right foot even between the right first and second toe. Skin around this area dorsally and plantar aspects look better. He has a reopening of the area on the right ankle just above the crease of the ankle dorsally.  I continue to think that this is probably friction from spasms may be even this time with his stocking under the compression stockings. Wounds on his left buttock look about the same there a couple of areas that have reopened. He has a total square area of loss of epithelialization. This does not look like infection it looks like a contact dermatitis but I just cannot determine to what 3/14; there is nothing on the right foot between the first and second toes this was carefully inspected under illumination. Some chronic irritation on the dorsal part of his foot from toes 1-3 at the base. Nothing really open here substantially. Still has an area on the right foot/ankle that is actually larger and hyper granulated. His buttock area on the left is just about closed however he has chronic inflammation with loss of the surface epithelial layer 3/28; 2-week follow-up. In clinic today with a new wound on the left anterior mid tibia. Says this happened about 2 weeks ago. He is not really sure how wonders about the spasticity of his legs at night whether that could have caused this other than that he does not have a good idea. He has been using topical antibiotics and silver alginate. The area on his right dorsal ankle seems somewhat better. Finally everything on his left buttock is closed.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 04/22/16; the patient is here for a wound check [Apligraf last week] intake nurse concerned about erythema around the wounds. Apparently a significant degree of drainage. The patient has chronic  venous inflammation which I think accounts for most of this however I was asked to look at this today 04/26/16; the patient came back for check of possible cellulitis in his left foot however the Apligraf dressing was inadvertently removed therefore we elected to prep the wound for a second Apligraf. I put him on doxycycline on 6/1 the erythema in the foot 05/03/16 we did not remove the dressing from the superior wound as this is where I put all of his last Apligraf. Surface debridement done with a curette of the lower wound which looks very healthy. The area on the left foot also looks quite satisfactory at the dorsal artery at the first and second toes 05/10/16; continue Apligraf to this. Her wound, Hydrafera to the lower wound. He has a new area on the right second toe. Left dorsal foot firstoosecond toe also looks improved 05/24/16; wound dimensions must be smaller I was able to use Apligraf to all 3 remaining wound areas. 06/07/16 patient's last Apligraf was 2 weeks ago. He arrives today with the 2 wounds on his lateral left leg joined together. This would have to be seen as a negative. He also has a small wound in his first and second toe on the left dorsally with quite a bit of surrounding erythema in the first second and third toes. This looks to be infected or inflamed, very difficult clinical call. 06/21/16: lateral left  leg combined wounds. Adherent surface slough area on the left dorsal foot at roughly the fourth toe looks improved 07/12/16; he now has a single linear wound on the lateral left leg. This does not look to be a lot changed from when I lost saw this. The area on his dorsal left foot looks considerably better however. 08/02/16; no major change in the substantial area on his left lateral leg since last time. We have been using Hydrofera Blue for a prolonged period of time now. The area on his left foot is also unchanged from last review 07/19/16; the area on his dorsal foot on the left looks considerably smaller. He is beginning to have significant rims of epithelialization on the lateral left leg wound. This also looks better. 08/05/16; the patient came in for a nurse visit today. Apparently the area on his left lateral leg looks better and it was wrapped. However in general discussion the patient noted a new area on the dorsal aspect of his right second toe. The exact etiology of this is unclear but likely relates to pressure. 08/09/16 really the area on the left lateral leg did not really look that healthy today perhaps slightly larger and measurements. The area on his dorsal right second toe is improved also the left foot wound looks stable to improved 08/16/16; the area on the last lateral leg did not change any of dimensions. Post debridement with a curet the area looked better. Left foot wound improved and the area on the dorsal right second toe is improved 08/23/16; the area on the left lateral leg may be slightly smaller both in terms of length and width. Aggressive debridement with a curette afterwards the tissue appears healthier. Left foot wound appears improved in the area on the dorsal right second toe is improved 08/30/16 patient developed a fever over the weekend and was seen in an urgent care. Felt to have a UTI and put on doxycycline. He has been since changed over the phone to [facility]. After  we took off the wrap on his right leg today the leg  is swollen warm and erythematous, probably more likely the source of the fever 09/06/16; have been using collagen to the major left leg wound, silver alginate to the area on his anterior foot/toes 09/13/16; the areas on his anterior foot/toes on both sides appear to be virtually closed. Extensive wound on the left lateral leg perhaps slightly narrower but each visit still covered an adherent surface slough 09/16/16 patient was in for his usual Thursday nurse visit however the intake nurse noted significant erythema of his dorsal right foot. He is also running a low- grade fever and having increasing spasms in the right leg 09/20/16 here for cellulitis involving his right great toes and forefoot. This is a lot better. Still requiring debridement on his left lateral leg. Santyl direct says he needs prior authorization. Therefore his wife cannot change this at home 09/30/16; the patient's extensive area on the left lateral calf and ankle perhaps somewhat better. Using Santyl. The area on the left toes is healed and I think the area on his right dorsal foot is healed as well. There is no cellulitis or venous inflammation involving the right leg. He is going to need compression stockings here. 10/07/16; the patient's extensive wound on the left lateral calf and ankle does not measure any differently however there appears to be less adherent surface slough using Santyl and aggressive weekly debridements 10/21/16; no major change in the area on the left lateral calf. Still the same measurement still very difficult to debridement adherent slough and nonviable subcutaneous tissue. This is not really been helped by several weeks of Santyl. Previously for 2 weeks I used Iodoflex for a short period. A prolonged course of Hydrofera Blue didn't really help. I'm not sure why I only used 2 weeks of Iodoflex on this there is no evidence of surrounding infection. He  has a small area on the right second toe which looks as though it's progressing towards closure 10/28/16; the wounds on his toes appear to be closed. No major change in the left lateral leg wound although the surface looks somewhat better using Iodoflex. He has had previous arterial studies that were normal. He has had reflux studies and is status post ablation although I don't have any exact notes on which vein was ablated. I'll need to check the surgical record 11/04/16; he's had a reopening between the first and second toe on the left and right. No major change in the left lateral leg wound. There is what appears to be cellulitis of the left dorsal foot 11/18/16 the patient was hospitalized initially in [place] and then subsequently transferred to [facility] and was admitted there from 11/09/16 through 11/12/16. He had developed progressive cellulitis on the right leg in spite of the doxycycline I gave him. I'd spoken to the hospitalist in [place] who was concerned about continuing leukocytosis. CT scan is what I suggested this was done which showed soft tissue swelling without evidence of osteomyelitis or an underlying abscess blood cultures were negative. At [facility] he was treated with vancomycin and Primaxin and then add an infectious disease consult. He was transitioned to Ceftaroline. He has been making progressive improvement. Overall a severe cellulitis of the right leg. He is been using silver alginate to her original wound on the left leg. The wounds in his toes on the right are closed there is a small open area on the base of the left second toe 11/26/15; the patient's right leg is much better although there is still some edema  here this could be reminiscent from his severe cellulitis likely on top of some degree of lymphedema. His left anterior leg wound has less surface slough as reported by her intake nurse. Small wound at the base of the left second toe 12/02/16; patient's right leg  is better and there is no open wound here. His left anterior lateral leg wound continues to have a healthy-looking surface. Small wound at the base of the left second toe however there is erythema in the left forefoot which is worrisome 12/16/16; is no open wounds on his right leg. We took measurements for stockings. His left anterior lateral leg wound continues to have a healthy-looking surface. I'm not sure where we were with the Apligraf run through his insurance. We have been using Iodoflex. He has a thick eschar on the left first second toe interface, I suspect this may be fungal however there is no visible open 12/23/16; no open wound on his right leg. He has 2 small areas left of the linear wound that was remaining last week. We have been using Prisma, I thought I have disclosed this week, we can only look forward to next week 01/03/17; the patient had concerning areas of erythema last week, already on doxycycline for UTI through his primary doctor. The erythema is absolutely no better there is warmth and swelling both medially from the left lateral leg wound and also the dorsal left foot. 01/06/17- Patient is here for follow-up evaluation of his left lateral leg ulcer and bilateral feet ulcers. He is on oral antibiotic therapy, tolerating that. Nursing staff and the patient states that the erythema is improved from Monday. 01/13/17; the predominant left lateral leg wound continues to be problematic. I had put Apligraf on him earlier this month once. However he subsequently developed what appeared to be an intense cellulitis around the left lateral leg wound. I gave him Dalvance I think on 2/12 perhaps 2/13 he continues on cefdinir. The erythema is still present but the warmth and swelling is improved. I am hopeful that the cellulitis part of this control. I wouldn't be surprised if there is an element of venous inflammation as well. 01/17/17. The erythema is present but better in the left leg. His  left lateral leg wound still does not have a viable surface buttons certain parts of this long thin wound it appears like there has been improvement in dimensions. 01/20/17; the erythema still present but much better in the left leg. I'm thinking this is his usual degree of chronic venous inflammation. The wound on the left leg looks somewhat better. Is less surface slough 01/27/17; erythema is back to the chronic venous inflammation. The wound on the left leg is somewhat better. I am back to the point where I like to try an Apligraf once again 02/10/17; slight improvement in wound dimensions. Apligraf #2. He is completing his doxycycline 02/14/17; patient arrives today having completed doxycycline last Thursday. This was supposed to be a nurse visit however once again he hasn't tense erythema from the medial part of his wound extending over the lower leg. Also erythema in his foot this is roughly in the same distribution as last time. He has baseline chronic venous inflammation however this is a lot worse than the baseline I have learned to accept the on him is baseline inflammation 02/24/17- patient is here for follow-up evaluation. He is tolerating compression therapy. His voicing no complaints or concerns he is here anticipating an Apligraf 03/03/17; he arrives today with an  adherent necrotic surface. I don't think this is surface is going to be amenable for Apligraf's. The erythema around his wound and on the left dorsal foot has resolved he is off antibiotics 03/10/17; better-looking surface today. I don't think he can tolerate Apligraf's. He tells me he had a wound VAC after a skin graft years ago to this area and they had difficulty with a seal. The erythema continues to be stable around this some degree of chronic venous inflammation but he also has recurrent cellulitis. We have been using Iodoflex 03/17/17; continued improvement in the surface and may be small changes in dimensions. Using Iodoflex  which seems the only thing that will control his surface 03/24/17- He is here for follow up evaluation of his LLE lateral ulceration and ulcer to right dorsal foot/toe space. He is voicing no complaints or concerns, He is tolerating compression wrap. 03/31/17 arrives today with a much healthier looking wound on the left lower extremity. We have been using Iodoflex for a prolonged period of time which has for the first time prepared and adequate looking wound bed although we have not had much in the way of wound dimension improvement. He also has a small wound between the first and second toe on the right 04/07/17; arrives today with a healthy-looking wound bed and at least the top 50% of this wound appears to be now her. No debridement was required I have changed him to [employer] last week after prolonged Iodoflex. He did not do well with Apligraf's. We've had a re-opening between the first and second toe on the right 04/14/17; arrives today with a healthier looking wound bed contractions and the top 50% of this wound and some on the lesser 50%. Wound bed appears healthy. The area between the first and second toe on the right still remains problematic 04/21/17; continued very gradual improvement. Using [facility] 04/28/17; continued very gradual improvement in the left lateral leg venous insufficiency wound. His periwound erythema is very mild. We have been using Hydrofera Blue. Wound is making progress especially in the superior 50% 05/05/17; he continues to have very gradual improvement in the left lateral venous insufficiency wound. Both in terms with an length rings are improving. I debrided this every 2 weeks with #5 curet and we have been using Hydrofera Blue and again making good progress With regards to the wounds between his right first and second toe which I thought might of been tinea pedis he is not making as much progress very dry scaly skin over the area. Also the area at the base of  the left first and second toe in a similar condition 05/12/17; continued gradual improvement in the refractory left lateral venous insufficiency wound on the left. Dimension smaller. Surface still requiring debridement using Hydrofera Blue 05/19/17; continued gradual improvement in the refractory left lateral venous ulceration. Careful inspection of the wound bed underlying rumination suggested some degree of epithelialization over the surface no debridement indicated. Continue Hydrofera Blue difficult areas between his toes first and third on the left than first and second on the right. I'm going to change to silver alginate from silver collagen. Continue ketoconazole as I suspect underlying tinea pedis 05/26/17; left lateral leg venous insufficiency wound. We've been using Hydrofera Blue. I believe that there is expanding epithelialization over the surface of the wound albeit not coming from the wound circumference. This is a bit of an odd situation in which the epithelialization seems to be coming from the surface of the  wound rather than in the exact circumference. There is still small open areas mostly along the lateral margin of the wound. ooHe has unchanged areas between the left first and second and the right first second toes which I been treating for tenia pedis 06/02/17; left lateral leg venous insufficiency wound. We have been using Hydrofera Blue. Somewhat smaller from the wound circumference. The surface of the wound remains a bit on it almost epithelialized sedation in appearance. I use an open curette today debridement in the surface of all of this especially the edges ooSmall open wounds remaining on the dorsal right first and second toe interspace and the plantar left first second toe and her face on the left 06/09/17; wound on the left lateral leg continues to be smaller but very gradual and very dry surface using Hydrofera Blue 06/16/17 requires weekly debridements now on the left  lateral leg although this continues to contract. I changed to silver collagen last week because of dryness of the wound bed. Using Iodoflex to the areas on his first and second toes/web space bilaterally 06/24/17; patient with history of paraplegia also chronic venous insufficiency with lymphedema. Has a very difficult wound on the left lateral leg. This has been gradually reducing in terms of with but comes in with a very dry adherent surface. High switch to silver collagen a week or so ago with hydrogel to keep the area moist. This is been refractory to multiple dressing attempts. He also has areas in his first and second toes bilaterally in the anterior and posterior web space. I had been using Iodoflex here after a prolonged course of silver alginate with ketoconazole was ineffective [question tinea pedis] 07/14/17; patient arrives today with a very difficult adherent material over his left lateral lower leg wound. He also has surrounding erythema and poorly controlled edema. He was switched his Santyl last visit which the nurses are applying once during his doctor visit and once on a nurse visit. He was also reduced to 2 layer compression I'm not exactly sure of the issue here. 07/21/17; better surface today after 1 week of Iodoflex. Significant cellulitis that we treated last week also better. [Doxycycline] 07/28/17 better surface today with now 2 weeks of Iodoflex. Significant cellulitis treated with doxycycline. He has now completed the doxycycline and he is back to his usual degree of chronic venous inflammation/stasis dermatitis. He reminds me he has had ablations surgery here 08/04/17; continued improvement with Iodoflex to the left lateral leg wound in terms of the surface of the wound although the dimensions are better. 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 Problematic areas on the plantar aspect of the first second toe  web space on the left and the dorsal aspect of the first second toe web space on the right. At one point I felt these were probably related to chronic fungal infections in treated him aggressively for this although we have not made any improvement here. 08/11/17; left lateral leg. Surface continues to improve with the Iodoflex although we are not seeing much improvement in overall wound dimensions. Areas on his plantar left foot and right foot show no improvement. In fact the right foot looks somewhat worse 08/18/17; left lateral leg. We changed to [facility] Blue last week after a prolonged course of Iodoflex which helps get the surface better. It appears that the wound with is improved. Continue with difficult areas on the left dorsal first second and plantar first  second on the right 09/01/17; patient arrives in clinic today having had a temperature of 103 yesterday. He was seen in the ER and [facility]. The patient was concerned he could have cellulitis again in the right leg however they diagnosed him with a UTI and he is now on Keflex. He has a history of cellulitis which is been recurrent and difficult but this is been in the left leg, in the past 5 use doxycycline. He does in and out catheterizations at home which are risk factors for UTI 09/08/17; patient will be completing his Keflex this weekend. The erythema on the left leg is considerably better. He has a new wound today on the medial part of the right leg small superficial almost looks like a skin tear. He has worsening of the area on the right dorsal first and second toe. His major area on the left lateral leg is better. Using Hydrofera Blue on all areas 09/15/17; gradual reduction in width on the long wound in the left lateral leg. No debridement required. He also has wounds on the plantar aspect of his left first second toe web space and on the dorsal aspect of the right first second toe web space. 09/22/17; there continues to be very  gradual improvements in the dimensions of the left lateral leg wound. He hasn't round erythematous spot with might be pressure on his wheelchair. There is no evidence obviously of infection no purulence no warmth ooHe has a dry scaled area on the plantar aspect of the left first second toe ooImproved area on the dorsal right first second toe. 09/29/17; left lateral leg wound continues to improve in dimensions mostly with an is still a fairly long but increasingly narrow wound. ooHe has a dry scaled area on the plantar aspect of his left first second toe web space ooIncreasingly concerning area on the dorsal right first second toe. In fact I am concerned today about possible cellulitis around this wound. The areas extending up his second toe and although there is deformities here almost appears to abut on the nailbed. 10/06/17; left lateral leg wound continues to make very gradual progress. Tissue culture I did from the right first second toe dorsal foot last time grew MRSA and enterococcus which was vancomycin sensitive. This was not sensitive to clindamycin or doxycycline. He is allergic to Zyvox and sulfa we have therefore arrange for him to have dalvance infusion tomorrow. He is had this in the past and tolerated it well 10/20/17; left lateral leg wound continues to make decent progress. This is certainly reduced in terms of with there is advancing epithelialization.ooThe cellulitis in the right foot looks better although he still has a deep wound in the dorsal aspect of the first second toe web space. Plantar left first toe web space on the left I think is making some progress 10/27/17; left lateral leg wound continues to make decent progress. Advancing epithelialization.using Hydrofera Blue ooThe right first second toe web space wound is better-looking using silver alginate ooImprovement in the left plantar first second toe web space. Again using silver alginate 11/03/17 left lateral leg  wound continues to make decent progress albeit slowly. Using Hydrofera Blue ooThe right per second toe web space continues to be a very problematic looking punched out wound. I obtained a piece of tissue for deep culture I did extensively treated this for fungus. It is difficult to imagine that this is a pressure area as the patient states other than going outside he doesn't really wear  shoes at home ooThe left plantar first second toe web space looked fairly senescent. Necrotic edges. This required debridement oochange to Hydrofera Blue to all wound areas 11/10/17; left lateral leg wound continues to contract. Using Hydrofera Blue ooOn the right dorsal first second toe web space dorsally. Culture I did of this area last week grew MRSA there is not an easy oral option in this patient was multiple antibiotic allergies or intolerances. This was only a rare culture isolate I'm therefore going to use Bactroban under silver alginate ooOn the left plantar first second toe web space. Debridement is required here. This is also unchanged 11/17/17; left lateral leg wound continues to contract using Hydrofera Blue this is no longer the major issue. ooThe major concern here is the right first second toe web space. He now has an open area going from dorsally to the plantar aspect. There is now wound on the inner lateral part of the first toe. Not a very viable surface on this. There is erythema spreading medially into the forefoot. ooNo major change in the left first second toe plantar wound 11/24/17; left lateral leg wound continues to contract using Hydrofera Blue. Nice improvement today ooThe right first second toe web space all of this looks a lot less angry than last week. I have given him clindamycin and topical Bactroban for MRSA and terbinafine for the possibility of underlining tinea pedis that I could not control with ketoconazole. Looks somewhat better ooThe area on the plantar left first  second toe web space is weeping with dried debris around the wound 12/01/17; left lateral leg wound continues to contract he Hydrofera Blue. It is becoming thinner in terms of with nevertheless it is making good improvement. ooThe right first second toe web space looks less angry but still a large necrotic-looking wounds starting on the plantar aspect of the right foot extending between the toes and now extensively on the base of the right second toe. I gave him clindamycin and topical Bactroban for MRSA anterior benefiting for the possibility of underlying tinea pedis. Not looking better today ooThe area on the left first/second toe looks better. Debrided of necrotic debris 12/05/17* the patient was worked in urgently today because over the weekend he found blood on his incontinence bad when he woke up. He was found to have an ulcer by his wife who does most of his wound care. He came in today for [place] to look at this. He has not had a history of wounds in his buttocks in spite of his paraplegia. 12/08/17; seen in follow-up today at his usual appointment. He was seen earlier this week and found to have a new wound on his buttock. We also follow him for wounds on the left lateral leg, left first second toe web space and right first second toe web space 12/15/17; we have been using Hydrofera Blue to the left lateral leg which has improved. The right first second toe web space has also improved. Left first second toe web space plantar aspect looks stable. The left buttock has worsened using Santyl. Apparently the buttock has drainage 12/22/17; we have been using Hydrofera Blue to the left lateral leg which continues to improve now 2 small wounds separated by normal skin. He tells [place] he had a fever up to 100 yesterday he is prone to UTIs but has not noted anything different. He does in and out catheterizations. The area between the first and second toes today does not look good necrotic surface covered  with  what looks to be purulent drainage and erythema extending into the third toe. I had gotten this to something that I thought look better last time however it is not look good today. He also has a necrotic surface over the buttock wound which is expanded. I thought there might be infection under here so I removed a lot of the surface with a #5 curet though nothing look like it really needed culturing. He is been using Santyl to this area 12/27/17; his original wound on the left lateral leg continues to improve using Hydrofera Blue. I gave him samples of Baxdella although he was unable to take them out of fear for an allergic reaction ["lump in his throat"].the culture I did of the purulent drainage from his second toe last week showed both enterococcus and a set Enterobacter I was also concerned about the erythema on the bottom of his foot although paradoxically although this looks somewhat better today. 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 We've been using silver alginate here. They came up today that he sleeps in a recliner, I'm not sure why but I asked him to stop this 01/03/18; his original wound we've been using Hydrofera Blue is now separated into 2 areas. ooUlcer on his left buttock is better he is off the recliner and sleeping in bed ooFinally both wound areas between his first and second toes also looks some better 01/10/18; his original wound on the left lateral leg is now separated into 2 wounds we've been using Hydrofera Blue ooUlcer on his left buttock has some drainage. There is a small probing site going into muscle layer superiorly.using silver alginate -He arrives today with a deep tissue injury on the left heel ooThe wound on the dorsal aspect of his first second toe on the left looks a lot betterusing silver alginate ketoconazole ooThe area on the first second toe web space on the  right also looks a lot bette 01/17/18; his original wound on the left lateral leg continues to progress using Hydrofera Blue ooUlcer on his left buttock also is smaller surface healthier except for a small probing site going into the muscle layer superiorly. 2.4 cm of tunneling in this area ooDTI on his left heel we have only been offloading. Looks better than last week no threatened open no evidence of infection oothe wound on the dorsal aspect of the first second toe on the left continues to look like it's regressing we have only been using silver alginate and terbinafine orally ooThe area in the first second toe web space on the right also looks to be a lot better using silver alginate and terbinafine I think this was prompted by tinea pedis 01/31/18; the patient was hospitalized in [place] last week apparently for a complicated UTI. He was discharged on cefepime he does in and out catheterizations. In the hospital he was discovered M I don't mild elevation of AST and ALT and the terbinafine was stopped.predictably the pressure ulcer on s his buttock looks betterusing silver alginate. The area on the left lateral leg also is better using Hydrofera Blue. The area between the first and second toes on the left better. First and second toes on the right still substantial but better. Finally the DTI on the left heel has held together and looks like it's resolving 02/07/18-he is here in follow-up evaluation for multiple ulcerations. He has new injury to the  lateral aspect of the last issue a pressure ulcer, he states this is from adhesive removal trauma. He states he has tried multiple adhesive products with no success. All other ulcers appear stable. The left heel DTI is resolving. We will continue with same treatment plan and follow-up next week. 02/14/18; follow-up for multiple areas. ooHe has a new area last week on the lateral aspect of his pressure ulcer more over the posterior trochanter. The  original pressure ulcer looks quite stable has healthy granulation. 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 Almost fully epithelialized on the original superior area using Hydrofera Blue ooDTI on the left heel has peeled off this week to reveal a small superficial wound under denuded skin and subcutaneous tissue ooBoth areas between the first and second toes look better including nothing open on the left 02/21/18; ooThe patient's wounds on his left ischial tuberosity and posterior left greater trochanter actually looked better. He has a large area of irritation around the area which I think is contact dermatitis. I am doubtful that this is fungal ooHis original wound on the left lateral calf continues to improve we have been using Hydrofera Blue ooThere is no open area in the left first second toe web space although there is a lot of thick callus ooThe DTI on the left heel required debridement today of necrotic surface eschar and subcutaneous tissue using silver alginate ooFinally the area on the right first second toe webspace continues to contract using silver alginate and ketoconazole 02/28/18 ooLeft ischial tuberosity wounds look better using silver alginate. ooOriginal wound on the left calf only has one small open area left using Hydrofera Blue ooDTI on the left heel required debridement mostly removing skin from around this wound surface. Using silver alginate ooThe areas on the right first/second toe web space using silver alginate and ketoconazole 03/08/18 on evaluation today patient appears to be doing decently well as best I can tell in regard to his wounds. This is the first time that I have seen him as he generally is followed by Dr. Dellia Nims. With that being said none of his wounds appear to be infected he does have an area where there is some skin covering what appears  to be a new wound on the left dorsal surface of his great toe. This is right at the nail bed. With that being said I do believe that debrided away some of the excess skin can be of benefit in this regard. Otherwise he has been tolerating the dressing changes without complication. 03/14/18; patient arrives today with the multiplicity of wounds that we are following. He has not been systemically unwell ooOriginal wound on the left lateral calf now only has 2 small open areas we've been using Hydrofera Blue which should continue ooThe deep tissue injury on the left heel requires debridement today. We've been using silver alginate ooThe left first second toe and the right first second toe are both are reminiscence what I think was tinea pedis. Apparently some of the callus Surface between the toes was removed last week when it started draining. ooPurulent drainage coming from the wound on the ischial tuberosity on the left. 03/21/18-He is here in follow-up evaluation for multiple wounds. 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 He tolerated debridement. The only change to last week's recommendations is to discontinue antifungal cream between toes. He  will follow-up next week 03/28/18; following up for multiple wounds;Concern this week is streaking redness and swelling in the right foot. He is going to need antibiotics for this. 03/31/18; follow-up for right foot cellulitis. Streaking redness and swelling in the right foot on 03/28/18. 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 I put him on clindamycin 300 mg every 6 and brought him in for a quick check. He has an open wound between his first and second toes on the right foot as a potential source. 04/04/18; ooRight foot cellulitis is resolving he is completing clindamycin. This is truly good news ooLeft lateral calf wound which is initial wound only has one  small open area inferiorly this is close to healing out. He has compression stockings. We will use Hydrofera Blue right down to the epithelialization of this ooNonviable surface on the left heel which was initially pressure with a DTI. We've been using Hydrofera Blue. I'm going to switch this back to silver alginate ooLeft first second toe/tinea pedis this looks better using silver alginate ooRight first second toe tinea pedis using silver alginate ooLarge pressure ulcers on theLeft ischial tuberosity. Small wound here Looks better. I am uncertain about the surface over the large wound. Using silver alginate 04/11/18; ooCellulitis in the right foot is resolved ooLeft lateral calf wound which was his original wounds still has 2 tiny open areas remaining this is just about closed ooNonviable surface on the left heel is better but still requires debridement ooLeft first second toe/tinea pedis still open using silver alginate ooRight first second toe wound tinea pedis I asked him to go back to using ketoconazole and silver alginate ooLarge pressure ulcers on the left ischial tuberosity this shear injury here is resolved. Wound is smaller. No evidence of infection using silver alginate 04/18/18; ooPatient arrives with an intense area of cellulitis in the right mid lower calf extending into the right heel area. Bright red and warm. Smaller area on the left anterior leg. He has a significant history of MRSA. He will definitely need antibioticsoodoxycycline ooHe now has 2 open areas on the left ischial tuberosity the original large wound and now a satellite area which I think was above his initial satellite areas. Not a wonderful surface on this satellite area surrounding erythema which looks like pressure related. ooHis left lateral calf wound again his original wound is just about closed ooLeft heel pressure injury still requiring debridement ooLeft first second toe looks a lot better  using silver alginate ooRight first second toe also using silver alginate and ketoconazole cream also looks better 04/20/18; the patient was worked in early today out of concerns with his cellulitis on the right leg. I had started him on doxycycline. This was 2 days ago. His wife was concerned about the swelling in the area. Also concerned about the left buttock. He has not been systemically unwell no fever chills. No nausea vomiting or diarrhea 04/25/18; the patient's left buttock wound is continued to deteriorate he is using Hydrofera Blue. He is still completing clindamycin for the cellulitis on the right leg although all of this looks better. 05/02/18 ooLeft buttock wound still with a lot of drainage and a very tightly adherent fibrinous necrotic surface. He has a deeper area superiorly ooThe left lateral calf wound is still closed ooDTI wound on the left heel necrotic surface especially the circumference using Iodoflex ooAreas between his left first second toe and right first second toe both look better. Dorsally and the right  first second toe he had a necrotic surface although at smaller. In using silver alginate and ketoconazole. I did a culture last week which was a deep tissue culture of the reminiscence of the open wound on the right first second toe dorsally. This grew a few Acinetobacter and a few methicillin-resistant staph aureus. Nevertheless the area actually this week looked better. I didn't feel the need to specifically address this at least in terms of systemic antibiotics. 05/09/18; wounds are measuring larger more drainage per our intake. We are using Santyl covered with alginate on the large superficial buttock wounds, Iodosorb on the left heel, ketoconazole and silver alginate to the dorsal first and second toes bilaterally. 05/16/18; ooThe area on his left buttock better in some aspects although the area superiorly over the ischial tuberosity required an extensive  debridement.using Santyl ooLeft heel appears stable. Using Iodoflex ooThe areas between his first and second toes are not bad however there is spreading erythema up the dorsal aspect of his left foot this looks like cellulitis again. He is insensate the erythema is really very brilliant.o Erysipelas He went to see an allergist days ago because he was itching part of this he had lab work done. This showed a white count of 15.1 with 70% neutrophils. Hemoglobin of 11.4 and a platelet count of 659,000. Last white count we had in Epic was a 2-1/2 years ago which was 25.9 but he was ill at the time. He was able to show me some lab work that was done by his primary physician the pattern is about the same. 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 But prompted me to go ahead and do x-rays of both feet and the pelvis rule out osteomyelitis. He also had a comprehensive metabolic panel this was reasonably normal his albumin was 3.7 liver function tests BUN/creatinine all normal 05/23/18; x-rays of both his feet from last week were negative for underlying pulmonary abnormality. The x-ray of his pelvis however showed mild irregularity in the left ischial which may represent some early osteomyelitis. The wound in the left ischial continues to get deeper clearly now exposed muscle. Each week necrotic surface material over this area. Whereas the rest of the wounds do not look so bad. ooThe left ischial wound we have been using Santyl and calcium alginate ooT the left heel surface necrotic debris using Iodoflex o ooThe left lateral leg is still healed ooAreas on the left dorsal foot and the right dorsal foot are about the same. There is some inflammation on the left which might represent contact dermatitis, fungal dermatitis I am doubtful cellulitis although this looks better than last week 05/30/18; CT scan done at Hospital did not show any osteomyelitis or abscess.  Suggested the possibility of underlying cellulitis although I don't see a lot of evidence of this at the bedside ooThe wound itself on the left buttock/upper thigh actually looks somewhat better. No debridement ooLeft heel also looks better no debridement continue Iodoflex ooBoth dorsal first second toe spaces appear better using Lotrisone. Left still required debridement 06/06/18; ooIntake reported some purulent looking drainage from the left gluteal wound. Using Santyl and calcium alginate ooLeft heel looks better although still a nonviable surface requiring debridement ooThe left dorsal foot first/second webspace actually expanding and somewhat deeper. I may consider doing a shave biopsy of this area ooRight dorsal foot first/second webspace appears stable to improved. Using Lotrisone and silver alginate to both these areas 06/13/18 [facility]  ooLeft gluteal surface looks better. Now separated in the 2 wounds. No debridement required. Still drainage. We'll continue silver alginate ooLeft heel continues to look better with Iodoflex continue this for at least another week ooOf his dorsal foot wounds the area on the left still has some depth although it looks better than last week. We've been using Lotrisone and silver alginate 06/20/18 ooLeft gluteal continues to look better healthy tissue ooLeft heel continues to look better healthy granulation wound is smaller. He is using Iodoflex and his long as this continues continue the Iodoflex ooDorsal right foot looks better unfortunately dorsal left foot does not. There is swelling and erythema of his forefoot. He had minor trauma to this several days ago but doesn't think this was enough to have caused any tissue injury. Foot looks like cellulitis, we have had this problem before 06/27/18 on evaluation today patient appears to be doing a little worse in regard to his foot ulcer. 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 Both of which would really be his only options for treating this infection. In the past he has been given and effusion of Orbactiv. This is done very well for him in the past again it's one time dosing IV antibiotic therapy. Subsequently I do believe this is something we're gonna need to see about doing at this point in time. Currently his other wounds seem to be doing somewhat better in my pinion I'm pretty happy in that regard. 07/03/18 on evaluation today patient's wounds actually appear to be doing fairly well. He has been tolerating the dressing changes without complication. All in all he seems to be showing signs of improvement. In regard to the antibiotics he has been dealing with infectious disease since I saw him last week as far as getting this scheduled. In the end [facility]'s [facility] this coming Friday. In the meantime he has been continuing to perform the dressing changes in such as previous. There does not appear to be any evidence of infection worsengin at this time. 07/10/18; ooSince I last saw this man 2 weeks ago things have actually improved. IV antibiotics of resulted in less forefoot erythema although there is still some present. He is not systemically unwell ooLeft buttock wounds o2 now have no depth there is increased epithelialization Using silver alginate ooLeft heel still requires debridement using Iodoflex ooLeft dorsal foot still with a sizable wound about the size of a border but healthy granulation ooRight dorsal foot still with a slitlike area using silver alginate 07/18/18; the patient's cellulitis in the left foot is improved in fact I think it is on its way to resolving. ooLeft buttock wounds o2 both look better although the larger one has hypertension granulation we've been using silver  alginate ooLeft heel has some thick circumferential redundant skin over the wound edge which will need to be removed today we've been using Iodoflex ooLeft dorsal foot is still a sizable wound required debridement using silver alginate ooThe right dorsal foot is just about closed only a small open area remains here 07/25/18; left foot cellulitis is resolved ooLeft buttock wounds o2 both look better. Hyper-granulation on the major area ooLeft heel as some debris over the surface but otherwise looks a healthier wound. Using silver collagen ooRight dorsal foot is just about  closed 07/31/18; arrives with our intake nurse worried about purulent drainage from the buttock. We had hyper-granulation here last week ooHis buttock wounds o2 continue to look better ooLeft heel some debris over the surface but measuring smaller. ooRight dorsal foot unfortunately has openings between the toes ooLeft foot superficial wound looks less aggravated. 08/07/18 ooButtock wounds continue to look better although some of her granulation and the larger medial wound. silver alginate ooLeft heel continues to look a lot better.silver collagen ooLeft foot superficial wound looks less stable. Requires debridement. He has a new wound superficial area on the foot on the lateral dorsal foot. ooRight foot looks better using silver alginate without Lotrisone 08/14/2018; patient was in the ER last week diagnosed with a UTI. He is now on Cefpodoxime and Macrodantin. ooButtock wounds continued to be smaller. Using silver alginate ooLeft heel continues to look better using silver collagen ooLeft foot superficial wound looks as though it is improving ooRight dorsal foot area is just about healed. 08/21/2018; patient is completed his antibiotics for his UTI. ooHe has 2 open areas on the buttocks. There is still not closed although the surface looks satisfactory. Using silver alginate ooLeft heel continues to improve  using silver collagen ooThe bilateral dorsal foot areas which are at the base of his first and second toes/possible tinea pedis are actually stable on the left but worse on the right. The area on the left required debridement of necrotic surface. After debridement I obtained a specimen for PCR culture. ooThe right dorsal foot which is been just about healed last week is now reopened 08/28/2018; culture done on the left dorsal foot showed coag negative staph both staph epidermidis and Lugdunensis. I think this is worthwhile initiating systemic treatment. I will use doxycycline given his long list of allergies. The area on the left heel slightly improved but still requiring debridement. ooThe large wound on the buttock is just about closed whereas the smaller one is larger. Using silver alginate in this area 09/04/2018; patient is completing his doxycycline for the left foot although this continues to be a very difficult wound area with very adherent necrotic debris. We are using silver alginate to all his wounds right foot left foot and the small wounds on his buttock, silver collagen on the left heel. 09/11/2018; once again this patient has intense erythema and swelling of the left forefoot. Lesser degrees of erythema in the right foot. He has a long list of allergies and intolerances. I will reinstitute doxycycline. 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 Using silver alginate ooLeft heel also looks better using silver collagen ooUnfortunately both the areas on his feet look worse. The area on the left first second webspace is now gone through to the plantar part of his foot. The area on the left foot anteriorly is irritated with erythema and swelling in the forefoot. 09/25/2018 ooHis wound on the left plantar heel looks better. Using silver collagen ooThe area on the left buttock 2 small remnant areas. One is closed one is still open.  Using silver alginate ooThe areas between both his first and second toes look worse. This in spite of long-standing antifungal therapy with ketoconazole and silver alginate which should have antifungal activity ooHe has small areas around his original wound on the left calf one is on the bottom of the original scar tissue and one superiorly both of these are small and superficial but again given wound history in  this site this is worrisome 10/02/2018 ooLeft plantar heel continues to gradually contract using silver collagen ooLeft buttock wound is unchanged using silver alginate ooThe areas on his dorsal feet between his first and second toes bilaterally look about the same. 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 Small open area here. We have been using silver alginate 10/09/2018; ooLeft plantar heel continues to look healthy and contract. No debridement is required ooLeft buttock slightly smaller with a tape injury wound just below which was new this week ooDorsal feet somewhat improved I have been using clindamycin ooLeft lateral looks lower extremity the actual open area looks worse although a lot of this is epithelialized. I am going to change to silver collagen today He has a lot more swelling in the right leg although this is not pitting not red and not particularly warm there is a lot of spasm in the right leg usually indicative of people with paralysis of some underlying discomfort. We have reviewed his vascular status from 2017 he had a left greater saphenous vein ablation. I wonder about referring him back to vascular surgery if the area on the left leg continues to deteriorate. 10/16/2018 in today for follow-up and management of multiple lower extremity ulcers. His left Buttock wound is much  lower smaller and almost closed completely. The wound to the left ankle has began to reopen with Epithelialization and some adherent slough. He has multiple new areas to the left foot and leg. The left dorsal foot without much improvement. Wound present between left great webspace and 2nd toe. Erythema and edema present right leg. Right LE ultrasound obtained on 10/10/18 was negative for DVT . 10/23/2018; ooLeft buttock is closed over. Still dry macerated skin but there is no open wound. I suspect this is chronic pressure/moisture ooLeft lateral calf is quite a bit worse than when I saw this last. There is clearly drainage here he has macerated skin into the left plantar heel. We will change the primary dressing to alginate ooLeft dorsal foot has some improvement in overall wound area. Still using clindamycin and silver alginate ooRight dorsal foot about the same as the left using clindamycin and silver alginate ooThe erythema in the right leg has resolved. He is DVT rule out was negative ooLeft heel pressure area required debridement although the wound is smaller and the surface is health 10/26/2018 ooThe patient came back in for his nurse check today predominantly because of the drainage coming out of the left lateral leg with a recent reopening of his original wound on the left lateral calf. He comes in today with a large amount of surrounding erythema around the wound extending from the calf into the ankle and even in the area on the dorsal foot. He is not systemically unwell. He is not febrile. Nevertheless this looks like cellulitis. We have been using silver alginate to the area. I changed him to a regular visit and I am going to prescribe him doxycycline. The rationale here is a long list of medication intolerances and a history of MRSA. I did not see anything that I thought would provide a valuable culture 10/30/2018 ooFollow-up from his appointment 4 days ago with really an extensive  area of cellulitis in the left calf left lateral ankle and left dorsal foot. I put him on doxycycline. He has a long  list of medication allergies which are true allergy reactions. 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 In any case he is a lot better today. The erythema especially in the anterior and lateral left calf is better. He still has left ankle erythema. He also is complaining about increasing edema in the right leg we have only been using Kerlix Coban and he has been doing the wraps at home. Finally he has a spotty rash on the medial part of his upper left calf which looks like folliculitis or perhaps wrap occlusion type injury. Small superficial macules not pustules 11/06/18 patient arrives today with again a considerable degree of erythema around the wound on the left lateral calf extending into the dorsal ankle and dorsal foot. This is a lot worse than when I saw this last week. He is on doxycycline really with not a lot of improvement. He has not been systemically unwell Wounds on the; left heel actually looks improved. Original area on the left foot and proximity to the first and second toes looks about the same. He has superficial areas on the dorsal foot, anterior calf and then the reopening of his original wound on the left lateral calf which looks about the same ooThe only area he has on the right is the dorsal webspace first and second which is smaller. ooHe has a large area of dry erythematous skin on the left buttock small open area here. 11/13/2018; the patient arrives in much better condition. The erythema around the wound on the left lateral calf is a lot better. Not sure whether this was the clindamycin or the TCA and ketoconazole or just in the improvement in edema control [stasis dermatitis]. In any case this is a lot better. The area on the left heel is very small and just about resolved using silver  collagen we have been using silver alginate to the areas on his dorsal feet 11/20/2018; his wounds include the left lateral calf, left heel, dorsal aspects of both feet just proximal to the first second webspace. He is stable to slightly improved. I did not think any changes to his dressings were going to be necessary 11/27/2018 he has a reopening on the left buttock which is surrounded by what looks like tinea or perhaps some other form of dermatitis. The area on the left dorsal foot has some erythema around it I have marked this area but I am not sure whether this is cellulitis or not. Left heel is not closed. Left calf the reopening is really slightly longer and probably worse 1/13; in general things look better and smaller except for the left dorsal foot. Area on the left heel is just about closed, left buttock looks better only a small wound remains in the skin looks better [using Lotrisone] 1/20; the area on the left heel only has a few remaining open areas here. Left lateral calf about the same in terms of size, left dorsal foot slightly larger right lateral foot still not closed. The area on the left buttock has no open wound and the surrounding skin looks a lot better 1/27; the area on the left heel is closed. Left lateral calf better but still requiring extensive debridements. The area on his left buttock is closed. He still has the open areas on the left dorsal foot which is slightly smaller in the right foot which is slightly expanded. We have been using Iodoflex on these areas as well  2/3; left heel is closed. Left lateral calf still requiring debridement using Iodoflex there is no open area on his left buttock however he has dry scaly skin over a large area of this. Not really responding well to the Lotrisone. Finally the areas on his dorsal feet at the level of the first second webspace are slightly smaller on the right and about the same on the left. Both of these vigorously debrided with  Anasept and gauze 2/10; left heel remains closed he has dry erythematous skin over the left buttock but there is no open wound here. Left lateral leg has come in and with. Still requiring debridement we have been using Iodoflex here. Finally the area on the left dorsal foot and right dorsal foot are really about the same extremely dry callused fissured areas. He does not yet have a dermatology appointment 2/17; left heel remains closed. He has a new open area on the left buttock. The area on the left lateral calf is bigger longer and still covered in necrotic debris. No major change in his foot areas bilaterally. I am awaiting for a dermatologist to look on this. We have been using ketoconazole I do not know that this is been doing any good at all. 2/24; left heel remains closed. The left buttock wound that was new reopening last week looks better. The left lateral calf appears better also although still requires debridement. The major area on his foot is the left first second also requiring debridement. We have been putting Prisma on all wounds. I do not believe that the ketoconazole has done too much good for his feet. He will use Lotrisone I am going to give him a 2-week course of terbinafine. We still do not have a dermatology appointment 3/2 left heel remains closed however there is skin over bone in this area I pointed this out to him today. The left buttock wound is epithelialized but still does not look completely stable. The area on the left leg required debridement were using silver collagen here. With regards to his feet we changed to Lotrisone last week and silver alginate. 3/9; left heel remains closed. Left buttock remains closed. The area on the right foot is essentially closed. The left foot remains unchanged. Slightly smaller on the left lateral calf. Using silver collagen to both of these areas 3/16-Left heel remains closed. Area on right foot is closed. Left lateral calf above the  lateral malleolus open wound requiring debridement with easy bleeding. Left dorsal wound proximal to first toe also debrided. Left ischial area open new. Patient has been using Prisma with wrapping every 3 days. Dermatology appointment is apparently tomorrow.Patient has completed his terbinafine 2-week course with some apparent improvement according to him, there is still flaking and dry skin in his foot on the left 3/23; area on the right foot is reopened. The area on the left anterior foot is about the same still a very necrotic adherent surface. He still has the area on the left leg and reopening is on the left buttock. He apparently saw dermatology although I do not have a note. According to the patient who is usually fairly well informed they did not have any good ideas. Put him on oral terbinafine which she is been on before. 3/30; using silver collagen to all wounds. Apparently his dermatologist put him on doxycycline and rifampin presumably some culture grew staph. I do not have this result. He remains on terbinafine although I have used terbinafine on him before  4/6; patient has had a fairly substantial reopening on the right foot between the first and second toes. He is finished his terbinafine and I believe is on doxycycline and rifampin still as prescribed by dermatology. We have been using silver collagen to all his wounds although the patient reports that he thinks silver alginate does better on the wounds on his buttock. 4/13; the area on his left lateral calf about the same size but it did not require debridement. ooLeft dorsal foot just proximal to the webspace between the first and second toes is about the same. Still nonviable surface. I note some superficial bronze discoloration of the dorsal part of his foot ooRight dorsal foot just proximal to the first and second toes also looks about the same. I still think there may be the same discoloration I noted above on  the left ooLeft buttock wound looks about the same 4/20; left lateral calf appears to be gradually contracting using silver collagen. 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 The left dorsal foot wound is debrided of tightly adherent necrotic debris and really cleans up quite nicely. The right area is worse with expansion. I did not debride this it is now over the base of the second toe ooThe area on his left buttock is smaller no debridement is required using silver collagen 5/4; left calf continues to make good progress. ooHe arrives with erythema around the wounds on his dorsal foot which even extends to the plantar aspect. Very concerning for coexistent infection. He is finished the erythromycin I gave him for possible erythrasma this does not seem to have helped. ooThe area on the left foot is about the same base of the dorsal toes ooIs area on the buttock looks improved on the left 5/11; left calf and left buttock continued to make good progress. Left foot is about the same to slightly improved. ooMajor problem is on the right foot. He has not had an x-ray. Deep tissue culture I did last week showed both Enterobacter and E. coli. I did not change the doxycycline I put him on empirically although neither 1 of these were plated to doxycycline. He arrives today with the erythema looking worse on both the dorsal and plantar foot. Macerated skin on the bottom of the foot. he has not been systemically unwell 5/18-Patient returns at 1 week, left calf wound appears to be making some progress, left buttock wound appears slightly worse than last time, left foot wound looks slightly better, right foot redness is marginally better. X-ray of both feet show no air or evidence of osteomyelitis. Patient is finished his Omnicef and terbinafine. He continues to have macerated skin on the bottom of the left foot as well as right 5/26; left calf  wound is better, left buttock wound appears to have multiple small superficial open areas with surrounding macerated skin. X-rays that I did last time showed no evidence of osteomyelitis in either foot. He is finished cefdinir and doxycycline. I do not think that he was on terbinafine. He continues to have a large superficial open area on the right foot anterior dorsal and slightly between the first and second toes. I did send him to dermatology 2 months ago or so wondering about whether they would do a fungal scraping. I do not believe they did but did do a culture. We have been using silver alginate to the toe areas, he has been using antifungals at home topically either ketoconazole or Lotrisone.  We are using silver collagen on the left foot, silver alginate on the right, silver collagen on the left lateral leg and silver alginate on the left buttock 6/1; left buttock area is healed. We have the left dorsal foot, left lateral leg and right dorsal foot. We are using silver alginate to the areas on both feet and silver collagen to the area on his left lateral calf 6/8; the left buttock apparently reopened late last week. He is not really sure how this happened. He is tolerating the terbinafine. Using silver alginate to all wounds 6/15; left buttock wound is larger than last week but still superficial. ooCame in the clinic today with a report of purulence from the left lateral leg I did not identify any infection ooBoth areas on his dorsal feet appear to be better. He is tolerating the terbinafine. Using silver alginate to all wounds 6/22; left buttock is about the same this week, left calf quite a bit better. His left foot is about the same however he comes in with erythema and warmth in the right forefoot once again. Culture that I gave him in the beginning of May showed Enterobacter and E. coli. I gave him doxycycline and things seem to improve although neither 1 of these organisms was  specifically plated. 6/29; left buttock is larger and dry this week. Left lateral calf looks to me to be improved. Left dorsal foot also somewhat improved right foot completely unchanged. The erythema on the right foot is still present. He is completing the Ceftin dinner that I gave him empirically [see discussion above.) 7/6 - All wounds look to be stable and perhaps improved, the left buttock wound is slightly smaller, per patient bleeds easily, completed ceftin, the right foot redness is less, he is on terbinafine 7/13; left buttock wound about the same perhaps slightly narrower. Area on the left lateral leg continues to narrow. Left dorsal foot slightly smaller right foot about the same. We are using silver alginate on the right foot and Hydrofera Blue to the areas on the left. Unna boot on the left 2 layer compression on the right 7/20; left buttock wound absolutely the same. Area on lateral leg continues to get better. Left dorsal foot require debridement as did the right no major change in the 7/27; left buttock wound the same size necrotic debris over the surface. The area on the lateral leg is closed once again. His left foot looks better right foot about the same although there is some involvement now of the posterior first second toe area. He is still on terbinafine which I have given him for a month, not certain a centimeter major change 06/25/19-All wounds appear to be slightly improved according to report, left buttock wound looks clean, both foot wounds have minimal to no debris the right dorsal foot has minimal slough. We are using Hydrofera Blue to the left and silver alginate to the right foot and ischial wound. 8/10-Wounds all appear to be around the same, the right forefoot distal part has some redness which was not there before, however the wound looks clean and small. Ischial wound looks about the same with no changes 8/17; his wound on the left lateral calf which was his  original chronic venous insufficiency wound remains closed. Since I last saw him the areas on the left dorsal foot right dorsal foot generally appear better but require debridement. The area on his left initial tuberosity appears somewhat larger to me perhaps hyper granulated and bleeds very  easily. We have been using Hydrofera Blue to the left dorsal foot and silver alginate to everything else 8/24; left lateral calf remains closed. The areas on his dorsal feet on the webspace of the first and second toes bilaterally both look better. The area on the left buttock which is the pressure ulcer stage II slightly smaller. I change the dressing to Hydrofera Blue to all areas 8/31; left lateral calf remains closed. The area on his dorsal feet bilaterally look better. Using Hydrofera Blue. Still requiring debridement on the left foot. No change in the left buttock pressure ulcers however 9/14; left lateral calf remains closed. Dorsal feet look quite a bit better than 2 weeks ago. Flaking dry skin also a lot better with the ammonium lactate I gave him 2 weeks ago. The area on the left buttock is improved. He states that his Roho cushion developed a leak and he is getting a new one, in the interim he is offloading this vigorously 9/21; left calf remains closed. Left heel which was a possible DTI looks better this week. He had macerated tissue around the left dorsal foot right foot looks satisfactory and improved left buttock wound. I changed his dressings to his feet to silver alginate bilaterally. Continuing Hydrofera Blue on the left buttock. 9/28 left calf remains closed. Left heel did not develop anything [possible DTI] dry flaking skin on the left dorsal foot. Right foot looks satisfactory. Improved left buttock wound. We are using silver alginate on his feet Hydrofera Blue on the buttock. I have asked him to go back to the Lotrisone on his feet including the wounds and surrounding areas 10/5; left calf  remains closed. The areas on the left and right feet about the same. A lot of this is epithelialized however debris over the remaining open areas. He is using Lotrisone and silver alginate. The area on the left buttock using Hydrofera Blue 10/26. Patient has been out for 3 weeks secondary to Covid concerns. He tested negative but I think his wife tested positive. He comes in today with the left foot substantially worse, right foot about the same. Even more concerning he states that the area on his left buttock closed over but then reopened and is considerably deeper in one aspect than it was before [stage III wound] 11/2; left foot really about the same as last week. Quarter sized wound on the dorsal foot just proximal to the first second toes. Surrounding erythema with areas of denuded epithelium. This is not really much different looking. Did not look like cellulitis this time however. ooRight foot area about the same.. We have been using silver alginate alginate on his toes ooLeft buttock still substantial irritated skin around the wound which I think looks somewhat better. We have been using Hydrofera Blue here. 11/9; left foot larger than last week and a very necrotic surface. Right foot I think is about the same perhaps slightly smaller. Debris around the circumference also addressed. Unfortunately on the left buttock there is been a decline. Satellite lesions below the major wound distally and now a an additional one posteriorly we have been using Hydrofera Blue but I think this is a pressure issue 11/16; left foot ulcer dorsally again a very adherent necrotic surface. Right foot is about the same. Not much change in the pressure ulcer on his left buttock. 11/30; left foot ulcer dorsally basically the same as when I saw him 2 weeks ago. Very adherent fibrinous debris on the wound surface. Patient reports  a lot of drainage as well. The character of this wound has changed completely although it  has always been refractory. We have been using Iodoflex, patient changed back to alginate because of the drainage. Area on his right dorsal foot really looks benign with a healthier surface certainly a lot better than on the left. Left buttock wounds all improved using Hydrofera Blue 12/7; left dorsal foot again no improvement. Tightly adherent debris. PCR culture I did last week only showed likely skin contaminant. I have gone ahead and done a punch biopsy of this which is about the last thing in terms of investigations I can think to do. He has known venous insufficiency and venous hypertension and this could be the issue here. The area on the right foot is about the same left buttock slightly worse according to our intake nurse secondary to [facility] Blue sticking to the wound 12/14; biopsy of the left foot that I did last time showed changes that could be related to wound healing/chronic stasis dermatitis phenomenon no neoplasm. We have been using silver alginate to both feet. I change the one on the left today to Sorbact and silver alginate to his other 2 wounds 12/28; the patient arrives with the following problems; ooMajor issue is the dorsal left foot which continues to be a larger deeper wound area. Still with a completely nonviable surface ooParadoxically the area mirror image on the right on the right dorsal foot appears to be getting better. ooHe had some loss of dry denuded skin from the lower part of his original wound on the left lateral calf. Some of this area looked a little vulnerable and for this reason we put him in wrap that on this side this week ooThe area on his left buttock is larger. He still has the erythematous circular area which I think is a combination of pressure, sweat. This does not look like cellulitis or fungal dermatitis 11/26/2019; -Dorsal left foot large open wound with depth. Still debris over the surface. Using Sorbact ooThe area on the dorsal right foot  paradoxically has closed over [facility] has a reopening on the left ankle laterally at the base of his original wound that extended up into the calf. This appears clean. ooThe left buttock wound is smaller but with very adherent necrotic debris over the surface. We have been using silver alginate here as well The patient had arterial studies done in 2017. He had biphasic waveforms at the dorsalis pedis and posterior tibial bilaterally. ABI in the left was 1.17. Digit waveforms were dampened. He has slight spasticity in the great toes I do not think a TBI would be possible 1/11; the patient comes in today with a sizable reopening between the first and second toes on the right. This is not exactly in the same location where we have been treating wounds previously. According to our intake nurse this was actually fairly deep but 0.6 cm. The area on the left dorsal foot looks about the same the surface is somewhat cleaner using Sorbact, his MRI is in 2 days. We have not managed yet to get arterial studies. The new reopening on the left lateral calf looks somewhat better using alginate. The left buttock wound is about the same using alginate 1/18; the patient had his ARTERIAL studies which were quite normal. ABI in the right at 1.13 with triphasic/biphasic waveforms on the left ABI 1.06 again with triphasic/biphasic waveforms. It would not have been possible to have done a toe brachial index because  of spasticity. We have been using Sorbac to the left foot alginate to the rest of his wounds on the right foot left lateral calf and left buttock 1/25; arrives in clinic with erythema and swelling of the left forefoot worse over the first MTP area. This extends laterally dorsally and but also posteriorly. Still has an area on the left lateral part of the lower part of his calf wound it is eschared and clearly not closed. ooArea on the left buttock still with surrounding irritation and erythema. ooRight foot  surface wound dorsally. The area between the right and first and second toes appears better. 2/1; ooThe left foot wound is about the same. Erythema slightly better I gave him a week of doxycycline empirically ooRight foot wound is more extensive extending between the toes to the plantar surface ooLeft lateral calf really no open surface on the inferior part of his original wound however the entire area still looks vulnerable ooAbsolutely no improvement in the left buttock wound required debridement. 2/8; the left foot is about the same. Erythema is slightly improved I gave him clindamycin last week. ooRight foot looks better he is using Lotrimin and silver alginate ooHe has a breakdown in the left lateral calf. Denuded epithelium which I have removed ooLeft buttock about the same were using Hydrofera Blue 2/15; left foot is about the same there is less surrounding erythema. Surface still has tightly adherent debris which I have debriding however not making any progress ooRight foot has a substantial wound on the medial right second toe between the first and second webspace. ooStill an open area on the left lateral calf distal area. ooButtock wound is about the same 2/22; left foot is about the same less surrounding erythema. Surface has adherent debris. Polymen Ag Right foot area significant wound between the first and second toes. We have been using silver alginate here Left lateral leg polymen Ag at the base of his original venous insufficiency wound ooLeft buttock some improvement here 3/1; ooRight foot is deteriorating in the first second toe webspace. Larger and more substantial. We have been using silver alginate. ooLeft dorsal foot about the same markedly adherent surface debris using PolyMem Ag ooLeft lateral calf surface debris using PolyMem AG ooLeft buttock is improved again using PolyMem Ag. ooHe is completing his terbinafine. The erythema in the foot seems better.  He has been on this for 2 weeks 3/8; no improvement in any wound area in fact he has a small open area on the dorsal midfoot which is new this week. He has not gotten his foot x-rays yet 3/15; his x-rays were both negative for osteomyelitis of both feet. No major change in any of his wounds on the extremities however his buttock wounds are better. We have been using polymen on the buttocks, left lower leg. Iodoflex on the left foot and silver alginate on the right 3/22; arrives in clinic today with the 2 major issues are the improvement in the left dorsal foot wound which for once actually looks healthy with a nice healthy wound surface without debridement. Using Iodoflex here. Unfortunately on the left lateral calf which is in the distal part of his original wound he came to the clinic here for there was purulent drainage noted some increased breakdown scattered around the original area and a small area proximally. We we are using polymen here will change to silver alginate today. His buttock wound on the left is better and I think the area on the right first  second toe webspace is also improved 3/29; left dorsal foot looks better. Using Iodoflex. Left ankle culture from deterioration last time grew E. coli, Enterobacter and Enterococcus. I will give him a course of cefdinir although that will not cover Enterococcus. The area on the right foot in the webspace of the first and second toe lateral first toe looks better. The area on his buttock is about healed Vascular appointment is on April 21. This is to look at his venous system vis--vis continued breakdown of the wounds on the left including the left lateral leg and left dorsal foot he. He has had previous ablations on this side 4/5; the area between the right first and second toes lateral aspect of the first toe looks better. Dorsal aspect of the left first toe on the left foot also improved. Unfortunately the left lateral lower leg is larger and  there is a second satellite wound superiorly. The usual superficial abrasions on the left buttock overall better but certainly not closed 4/12; the area between the right first and second toes is improved. Dorsal aspect of the left foot also slightly smaller with a vibrant healthy looking surface. No real change in the left lateral leg and the left buttock wound is healed He has an unaffordable co-pay for Apligraf. Appointment with vein and vascular with regards to the left leg venous part of the circulation is on 4/21 4/19; we continue to see improvement in all wound areas. Although this is minor. He has his vascular appointment on 4/21. The area on the left buttock has not reopened although right in the center of this area the skin looks somewhat threatened 4/26; the left buttock is unfortunately reopened. In general his left dorsal foot has a healthy surface and looks somewhat smaller although it was not measured as such. The area between his first and second toe webspace on the right as a small wound against the first toe. The patient saw vascular surgery. The real question I was asking was about the small saphenous vein on the left. He has previously ablated left greater saphenous vein. Nothing further was commented on on the left. Right greater saphenous vein without reflux at the saphenofemoral junction or proximal thigh there was no indication for ablation of the right greater saphenous vein duplex was negative for DVT bilaterally. They did not think there was anything from a vascular surgery point of view that could be offered. They ABIs within normal limits 5/3; only small open area on the left buttock. The area on the left lateral leg which was his original venous reflux is now 2 wounds both which look clean. We are using Iodoflex on the left dorsal foot which looks healthy and smaller. He is down to a very tiny area between the right first and second toes, using silver alginate 5/10; all  of his wounds appear better. We have much better edema control in 4 layer compression on the left. This may be the factor that is allowing the left foot and left lateral calf to heal. He has external compression garments at home 04/14/20-All of his wounds are progressing well, the left forefoot is practically closed, left ischium appears to be about the same, right toe webspace is also smaller. The left lateral leg is about the same, continue using Hydrofera Blue to this, silver alginate to the ischium, Iodoflex to the toe space on the right 6/7; most of his wounds outside of the left buttock are doing well. The area on the left  lateral calf and left dorsal foot are smaller. The area on the right foot in between the first and second toe webspace is barely visible although he still says there is some drainage here is the only reason I did not heal this out. ooUnfortunately the area on the left buttock almost looks like he has a skin tear from tape. He has open wound and then a large flap of skin that we are trying to get adherence over an area just next to the remaining wound 6/21; 2 week follow-up. I believe is been here for nurse visits. Miraculously the area between his first and second toes on the left dorsal foot is closed over. Still open on the right first second web space. The left lateral calf has 2 open areas. Distally this is more superficial. The proximal area had a little more depth and required debridement of adherent necrotic material. His buttock wound is actually larger we have been using silver alginate here 6/28; the patient's area on the left foot remains closed. Still open wet area between the first and second toes on the right and also extending into the plantar aspect. We have been using silver alginate in this location. He has 2 areas on the left lower leg part of his original long wounds which I think are better. We have been using Hydrofera Blue here. Hydrofera Blue to the left  buttock which is stable 7/12; left foot remains closed. Left ankle is closed. May be a small area between his right first and second toes the only truly open area is on the left buttock. We have been using Hydrofera Blue here 7/19; patient arrives with marked deterioration especially in the left foot and ankle. We did not put him in a compression wrap on the left last week in fact he wore his juxta lite stockings on either side although he does not have an underlying stocking. He has a reopening on the left dorsal foot, left lateral ankle and a new area on the right dorsal ankle. More worrisome is the degree of erythema on the left foot extending on the lateral foot into the lateral lower leg on the left 7/26; the patient had erythema and drainage from the lateral left ankle last week. Culture of this grew MRSA resistant to doxycycline and clindamycin which are the 2 antibiotics we usually use with this patient who has multiple antibiotic allergies including linezolid, trimethoprim sulfamethoxazole. I had give him an empiric doxycycline and he comes in the area certainly looks somewhat better although it is blotchy in his lower leg. He has not been systemically unwell. He has had areas on the left dorsal foot which is a reopening, chronic wounds on the left lateral ankle. Both of these I think are secondary to chronic venous insufficiency. The area between his first and second toes is closed as far as I can tell. He had a new wrap injury on the right dorsal ankle last week. Finally he has an area on the left buttock. We have been using silver alginate to everything except the left buttock we are using Hydrofera Blue 06/30/20-Patient returns at 1 week, has been given a sample dose pack of NUZYRA which is a tetracycline derivative [omadacycline], patient has completed those, we have been using silver alginate to almost all the wounds except the left ischium where we are using Hydrofera Blue all of them look  better 8/16; since I last saw the patient he has been doing well. The area on the  left buttock, left lateral ankle and left foot are all closed today. He has completed the [place] I gave him last time and tolerated this well. He still has open areas on the right dorsal ankle and in the right first second toe area which we are using silver alginate. 8/23; we put him in his bilateral external compression stockings last week as he did not have anything open on either leg except for concerning area between the right first and second toe. He comes in today with an area on the left dorsal foot slightly more proximal than the original wound, the left lateral foot but this is actually a continuation of the area he had on the left lateral ankle from last time. As well he is opened up on the left buttock again. 8/30; comes in today with things looking a lot better. The area on the left lower ankle has closed down as has the left foot but with eschar in both areas. The area on the dorsal right ankle is also epithelialized. Very little remaining of the left buttock wound. We have been using silver alginate on all wound areas 9/13; the area in the first second toe webspace on the right has fully epithelialized. He still has some vulnerable epithelium on the right and the ankle and the dorsal foot. He notes weeping. He is using his juxta lite stocking. On the left again the left dorsal foot is closed left lateral ankle is closed. We went to the juxta lite stocking here as well. ooStill vulnerable in the left buttock although only 2 small open areas remain here 9/27; 2-week follow-up. We did not look at his left leg but the patient says everything is closed. He is a bit disturbed by the amount of edema in his left foot he is using juxta lite stockings but asking about over the toes stockings which would be 30/40, will talk to him next time. According to him there is no open wound on either the left foot or the left  ankle/calf He has an open area on the dorsal right calf which I initially point a wrap injury. He has superficial remaining wound on the left ischial tuberosity been using silver alginate although he says this sticks to the wound 10/5; we gave him 2-week follow-up but he called yesterday expressing some concerns about his right foot right ankle and the left buttock. He came in early. There is still no open areas on the left leg and that still in his juxta lite stocking 10/11; he only has 1 small area on the left buttock that remains measuring millimeters 1 mm. Still has the same irritated skin in this area. We recommended zinc oxide when this eventually closes and pressure relief is meticulously is he can do this. He still has an area on the dorsal part of his right first through third toes which is a bit irritated and still open and on the dorsal ankle near the crease of the ankle. We have been using silver alginate and using his own stocking. He has nothing open on the left leg or foot 10/25; 2-week follow-up. Not nearly as good on the left buttock as I was hoping. For open areas with 5 looking threatened small. He has the erythematous irritated chronic skin in this area. oo1 area on the right dorsal ankle. He reports this area bleeds easily ooRight dorsal foot just proximal to the base of his toes ooWe have been using silver alginate. 11/8; 2-week follow-up. Left buttock  is about the same although I do not think the wounds are in the same location we have been using silver alginate. I have asked him to use zinc oxide on the skin around the wounds. ooHe still has a small area on the right dorsal ankle he reports this bleeds easily ooRight dorsal foot just proximal to the base of the toes does not have anything open although the skin is very dry and scaly ooHe has a new opening on the nailbed of the left great toe. Nothing on the left ankle 11/29; 3-week follow-up. Left buttock has 2 open  areas. And washing of these wounds today started bleeding easily. Suggesting very friable tissue. We have been using silver alginate. Right dorsal ankle which I thought was initially a wrap injury we have been using silver alginate. Nothing open between the toes that I can see. He states the area on the left dorsal toe nailbed healed after the last visit in 2 or 3 days 12/13; 3-week follow-up. His left buttock now has 3 open areas but the original 2 areas are smaller using polymen here. Surrounding skin looks better. The right dorsal ankle is closed. He has a small opening on the right dorsal foot at the level of the third toe. In general the skin looks better here. He is wearing his juxta lite stocking on the left leg says there is nothing open 11/24/2020; 3 weeks follow-up. His left buttock still has the 3 open areas. We have been using polymen but due to lack of response he changed to [facility] area. Surrounding skin is dry erythematous and irritated looking. There is no evidence of infection either bacterial or fungal however there is loss of surface epithelium ooHe still has very dry skin in his foot causing irritation and erythema on the dorsal part of his toes. This is not responded to prolonged courses of antifungal simply looks dry and irritated 1/24; left buttock area still looks about the same he was unable to find the triad ointment that we had suggested. The area on the right lower leg just above the dorsal ankle has reopened and the areas on the right foot between the first second and second third toes and scaling on the bottom of the foot has been about the same for quite some time now. been using silver alginate to all wound areas 2/7; left buttock wound looked quite good although not much smaller in terms of surface area surrounding skin looks better. Only a few dry flaking areas on the right foot in between the first and second toes the skin generally looks better here [ammonium  lactate]. Finally the area on the right dorsal ankle is closed 2/21; ooThere is no open area on the right foot even between the right first and second toe. Skin around this area dorsally and plantar aspects look better. ooHe has a reopening of the area on the right ankle just above the crease of the ankle dorsally. I continue to think that this is probably friction from spasms may be even this time with his stocking under the compression stockings. ooWounds on his left buttock look about the same there a couple of areas that have reopened. He has a total square area of loss of epithelialization. This does not look like infection it looks like a contact dermatitis but I just cannot determine to what 3/14; there is nothing on the right foot between the first and second toes this was carefully inspected under illumination. Some chronic irritation  on the dorsal part of his foot from toes 1-3 at the base. Nothing really open here substantially. Still has an area on the right foot/ankle that is actually larger and hyper granulated. His buttock area on the left is just about closed however he has chronic inflammation with loss of the surface epithelial layer 3/28; 2-week follow-up. In clinic today with a new wound on the left anterior mid tibia. Says this happened about 2 weeks ago. He is not really sure how wonders about the spasticity of his legs at night whether that could have caused this other than that he does not have a good idea. He has been using topical antibiotics and silver alginate. The area on his right dorsal ankle seems somewhat better. ooFinally everything on his left buttock is closed. 4/11; 2-week follow-up. All of his wounds are better except for the area over the ischium and left buttock which have opened up widely again. At least part of this is covered in necrotic fibrinous material another part had rolled nonviable skin. The area on the right ankle, left anterior mid tibia are  both a lot better. He had no open wounds on either foot including the areas between the first and second toes 4/25; patient presents for 2-week follow-up. He states that the wounds are overall stable. He has no complaints today and states he is using Hydrofera Blue to open wounds. 5/9; have not seen this man in over a month. For my memory he has open areas on the left mid tibia and right ankle. T oday he has new open area on the right dorsal foot which we have not had a problem with recently. He has the sustained area on the left buttock He is also changed his insurance at the beginning of the year [employer]. We will need prior authorizations for debridement 5/23; patient presents for 2-week follow-up. He has prior authorizations for debridement. He denies any issues in the past 2 weeks with his wound care. He has been using Hydrofera Blue to all the wounds. He does report a circular rash to the upper left leg that is new. He denies acute signs of infection. 6/6; 2-week follow-up. The patient has open wounds on the left buttock which are worse than the last time I saw this about a month ago. He also has a new area to me on the left anterior mid tibia with some surrounding erythema. The area on the dorsal ankle on the right is closed but I think this will be a friction injury every time this area is exposed to either our wraps or his compression stockings caused by unrelenting spasms in this leg. 6/20; 2-week follow-up. oo The patient has open wounds on the left buttock which is about the same. Using [facility] here. - The left mid tibia has a static amount of surrounding erythema. Also a raised area in the center. We have been using Hydrofera Blue here. ooooFinally he has broken down in his dorsal right foot extending between the first and second toes and going to the base of the first and second toe webspace. I have previously assumed that this was severe venous hypertension 7/5; 2-week  follow-up oo The left buttock wound actually looks better. We are using Hydrofera Blue. He has extensive skin irritation around this area and I have not really been able to get that any better. I have tried Lotrisone i.e. antifungals and steroids. More most recently we have just been using Coloplast really looks about  the same. oo The left mid tibia which was new last week culture to have very resistant staph aureus. Not only methicillin-resistant but doxycycline resistant. The patient has a plethora of antibiotic allergies including sulfa, linezolid. I used topical bacitracin on this but he has not started this yet. oo In addition he has an expanding area of erythema with a wound on the dorsal right foot. I did a deep tissue culture of this area today 7/12; oo Left buttock area actually looks better surrounding skin also looks less irritated. oo Left mid tibia looks about the same. He is using bacitracin this is not worse oo Right dorsal foot looks about the same as well. oo The left first toe also looks about the same 7/19; left buttock wound continues to improve in terms of open areas oo Left mid tibia is still concerning amount of swelling he is using bacitracin oo Dorsal left first toe somewhat smaller oo Right dorsal foot somewhat smaller 7/25; left buttock wound actually continues to improve oo Left mid tibia area has less swelling. I gave him all my samples of new Nuzyra. This seems to have helped although the wound is still open it. His abrasion closed by here oo Left dorsal great toe really no better. Still a very nonviable surface oo Right dorsal foot perhaps some better. oo We have been using bacitracin and silver nitrate to the areas on his lower legs and Hydrofera Blue to the area on the buttock. 8/16 oo Disappointed that his left buttock wound is actually more substantial. Apparently during the last nurse visit these were both very small. He has  continued irritation to a large area of skin on his buttock. I have never been able to totally explain this although I think it some combination of the way he sits, pressure, moisture. He is not incontinent enough to contribute to this. oo Left dorsal great toe still fibrinous debris on the surface that I have debrided today oo Large area across the dorsal right toes. oo The area on the left anterior mid tibia has less swelling. He completed the [place]. This does not look infected although the tissue is still fried 8/30; 2-week follow-up. oo Left buttock areas not improved. We used Hydrofera Blue on this. Weeping wet with the surrounding erythema that I have not been able to control even with Lotrisone and topical Coloplast oo Left dorsal great toe looks about the same oo More substantial area again at the base of his toes on the left which is new this week. oo Area across the dorsal right toes looks improved oo The left anterior mid tibia looks like it is trying to close 9/13; 2-week follow-up. Using silver alginate on all of his wounds. The left dorsal foot does not look any better. He has the area on the dorsal toe and also the areas at the base of all of his toes 1 through 3. On the right foot he has a similar pattern in a similar area. He has the area on his left mid tibia that looks fairly healthy. Finally the area on his left buttock looks somewhat bette 9/20; culture I did of the left foot which was a deep tissue culture last time showed E. coli he has erythema around this wound. Still a completely necrotic surface. His right dorsal foot looks about the same. He has a very friable surface to the left anterior mid tibia. Both buttock wounds look better. We have been using silver alginate to all wounds  Objective Constitutional Sitting or standing Blood Pressure is within target range for patient.. Pulse regular and within target range for patient.Marland Kitchen Respirations regular, non-labored  and within target range.. Temperature is normal and within the target range for the patient.Marland Kitchen Appears in no distress. Vitals Time Taken: 10:37 AM, Height: 70 in, Weight: 216 lbs, BMI: 31, Temperature: 98.4 F, Pulse: 123 bpm, Respiratory Rate: 16 breaths/min, Blood Pressure: 120/76 mmHg. General Notes: Wound exam oo Right dorsal foot about the same as last week. Marked spasms. oo Left dorsal foot again an aggressive debridement with a #5 curette hemostasis with direct pressure. There is erythema around this wound oo On the left mid tibia very friable tissue I used silver nitrate on this oo Buttock wounds look better Integumentary (Hair, Skin) Wound #41R status is Open. Original cause of wound was Gradually Appeared. The date acquired was: 03/16/2020. The wound has been in treatment 73 weeks. The wound is located on the Left Ischium. The wound measures 0.5cm length x 0.5cm width x 0.1cm depth; 0.196cm^2 area and 0.02cm^3 volume. There is Fat Layer (Subcutaneous Tissue) exposed. There is no tunneling or undermining noted. There is a medium amount of sanguinous drainage noted. The wound margin is distinct with the outline attached to the wound base. There is large (67-100%) red, friable granulation within the wound bed. There is no necrotic tissue within the wound bed. Wound #51 status is Open. Original cause of wound was Trauma. The date acquired was: 01/26/2021. The wound has been in treatment 25 weeks. The wound is located on the Left,Anterior Lower Leg. The wound measures 1.6cm length x 1cm width x 0.1cm depth; 1.257cm^2 area and 0.126cm^3 volume. There is Fat Layer (Subcutaneous Tissue) exposed. There is no tunneling or undermining noted. There is a medium amount of serosanguineous drainage noted. The wound margin is distinct with the outline attached to the wound base. There is large (67-100%) red, friable, hyper - granulation within the wound bed. There is no necrotic tissue within the wound  bed. Wound #52 status is Open. Original cause of wound was Gradually Appeared. The date acquired was: 03/27/2021. The wound has been in treatment 19 weeks. The wound is located on the Right,Dorsal Foot. The wound measures 2cm length x 4.5cm width x 0.1cm depth; 7.069cm^2 area and 0.707cm^3 volume. There is Fat Layer (Subcutaneous Tissue) exposed. There is no tunneling or undermining noted. There is a medium amount of serosanguineous drainage noted. The wound margin is distinct with the outline attached to the wound base. There is small (1-33%) pink granulation within the wound bed. There is a large (67-100%) amount of necrotic tissue within the wound bed including Adherent Slough. Wound #54 status is Open. Original cause of wound was Pressure Injury. The date acquired was: 05/11/2021. The wound has been in treatment 13 weeks. The wound is located on the Left,Distal Ischium. The wound measures 0.3cm length x 0.4cm width x 0.1cm depth; 0.094cm^2 area and 0.009cm^3 volume. There is Fat Layer (Subcutaneous Tissue) exposed. There is no tunneling noted. There is a medium amount of sanguinous drainage noted. The wound margin is distinct with the outline attached to the wound base. There is large (67-100%) red, friable granulation within the wound bed. There is no necrotic tissue within the wound bed. Wound #55 status is Open. Original cause of wound was Blister. The date acquired was: 05/26/2021. The wound has been in treatment 11 weeks. The wound is located on the Left T Great. The wound measures 0.5cm length x 1cm width  x 0.2cm depth; 0.393cm^2 area and 0.079cm^3 volume. There is Fat Layer oe (Subcutaneous Tissue) exposed. There is no tunneling or undermining noted. There is a medium amount of serosanguineous drainage noted. The wound margin is distinct with the outline attached to the wound base. There is medium (34-66%) pink granulation within the wound bed. There is a medium (34-66%) amount of necrotic  tissue within the wound bed including Adherent Slough. Wound #56 status is Open. Original cause of wound was Gradually Appeared. The date acquired was: 07/11/2021. The wound has been in treatment 3 weeks. The wound is located on the Left,Dorsal Foot. The wound measures 3.8cm length x 3cm width x 0.1cm depth; 8.954cm^2 area and 0.895cm^3 volume. There is Fat Layer (Subcutaneous Tissue) exposed. There is no tunneling or undermining noted. There is a medium amount of purulent drainage noted. The wound margin is distinct with the outline attached to the wound base. There is small (1-33%) pink granulation within the wound bed. There is a large (67-100%) amount of necrotic tissue within the wound bed including Adherent Slough. 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 There was a Excisional Skin/Subcutaneous Tissue Debridement with a total area of 11.4 sq cm performed by Ricard Dillon., MD. With the following instrument(s): Curette to remove Viable and Non-Viable tissue/material. Material removed includes Subcutaneous Tissue and Slough and. No specimens were taken. A time out was conducted at 11:17, prior to the start of the procedure. A Moderate amount of bleeding was controlled with Pressure. The procedure was tolerated well with a pain level of 0 throughout and a pain level of 0 following the procedure. Post Debridement Measurements: 3.8cm length x 3cm width x 0.1cm depth;  0.895cm^3 volume. Character of Wound/Ulcer Post Debridement requires further debridement. Post procedure Diagnosis Wound #56: Same as Pre-Procedure Wound #51 Pre-procedure diagnosis of Wound #51 is a Trauma, Other located on the Left,Anterior Lower Leg . An Chemical Cauterization procedure was performed by Ricard Dillon., MD. Post procedure Diagnosis Wound #51: Same as Pre-Procedure Notes: silver nitrate Plan Follow-up Appointments: Return Appointment in 2 weeks. - with Dr. Dellia Nims Bathing/ Shower/ Hygiene: May shower and wash wound with soap and water. - on days that dressing is changed Edema Control - Lymphedema / SCD / Other: Elevate legs to the level of the heart or above for 30 minutes daily and/or when sitting, a frequency of: - throughout the day Compression stocking or Garment 30-40 mm/Hg pressure to: - Juxtalite to both legs daily Off-Loading: Roho cushion for wheelchair Turn and reposition every 2 hours The following medication(s) was prescribed: cephalexin oral 500 mg capsule 1 capsule oral q6h for 7s days for wound infection left foot starting 08/11/2021 gentamicin topical 0.1 % ointment ointment topical to wouind left foot with dressing changes WOUND #41R: - Ischium Wound Laterality: Left Cleanser: Soap and Water Every Other Day/30 Days Discharge Instructions: May shower and wash wound with dial antibacterial soap and water prior to dressing change. Peri-Wound Care: Triad Hydrophilic Wound Dressing Tube, 6 (oz) Every Other Day/30 Days Discharge  Instructions: Apply to periwound with each dressing change Peri-Wound Care: Lotrisone Every Other Day/30 Days Discharge Instructions: thin layer under triad cream. Prim Dressing: KerraCel Ag Gelling Fiber Dressing, 4x5 in (silver alginate) Every Other Day/30 Days ary Discharge Instructions: Apply silver alginate to wound bed as instructed Secondary Dressing: ComfortFoam Border, 4x4 in (silicone border) Every Other Day/30  Days Discharge Instructions: Apply over primary dressing as directed. WOUND #51: - Lower Leg Wound Laterality: Left, Anterior Cleanser: Soap and Water Every Other Day/30 Days Discharge Instructions: May shower and wash wound with dial antibacterial soap and water prior to dressing change. Prim Dressing: KerraCel Ag Gelling Fiber Dressing, 4x5 in (silver alginate) Every Other Day/30 Days ary Discharge Instructions: Apply silver alginate to wound bed as instructed Secondary Dressing: ComfortFoam Border, 4x4 in (silicone border) Every Other Day/30 Days Discharge Instructions: Apply over primary dressing as directed. WOUND #52: - Foot Wound Laterality: Dorsal, Right Cleanser: Soap and Water Every Other Day/30 Days Discharge Instructions: May shower and wash wound with dial antibacterial soap and water prior to dressing change. Prim Dressing: KerraCel Ag Gelling Fiber Dressing, 4x5 in (silver alginate) Every Other Day/30 Days ary Discharge Instructions: Apply silver alginate to wound bed as instructed Secondary Dressing: Woven Gauze Sponge, Non-Sterile 4x4 in Every Other Day/30 Days Discharge Instructions: Apply over primary dressing as directed. Secured With: [employer], 4.5x3.1 (in/yd) Every Other Day/30 Days Discharge Instructions: Secure with Kerlix as directed. Secured With: [employer] T ape, 2x10 (in/yd) Every Other Day/30 Days Discharge Instructions: Secure dressing with tape as directed. WOUND #54: - Ischium Wound Laterality: Left, Distal Cleanser: Soap and Water Every Other Day/30 Days Discharge Instructions: May shower and wash wound with dial antibacterial soap and water prior to dressing change. Peri-Wound Care: Triad Hydrophilic Wound Dressing Tube, 6 (oz) Every Other Day/30 Days Discharge Instructions: Apply to periwound with each dressing change Peri-Wound Care: Lotrisone Every Other Day/30 Days Discharge Instructions: thin layer under triad cream. Prim  Dressing: KerraCel Ag Gelling Fiber Dressing, 4x5 in (silver alginate) Every Other Day/30 Days ary Discharge Instructions: Apply silver alginate to wound bed as instructed Secondary Dressing: ComfortFoam Border, 4x4 in (silicone border) Every Other Day/30 Days Discharge Instructions: Apply over primary dressing as directed. WOUND #55: - T Great Wound Laterality: Left oe Cleanser: Soap and Water Every Other Day/30 Days Discharge Instructions: May shower and wash wound with dial antibacterial soap and water prior to dressing change. Prim Dressing: KerraCel Ag Gelling Fiber Dressing, 4x5 in (silver alginate) Every Other Day/30 Days ary Discharge Instructions: Apply silver alginate to wound bed as instructed Secondary Dressing: Woven Gauze Sponge, Non-Sterile 4x4 in Every Other Day/30 Days Discharge Instructions: Apply over primary dressing as directed. Secured With: [employer], 4.5x3.1 (in/yd) Every Other Day/30 Days Discharge Instructions: Secure with Kerlix as directed. Secured With: [employer] T ape, 2x10 (in/yd) Every Other Day/30 Days Discharge Instructions: Secure dressing with tape as directed. WOUND #56: - Foot Wound Laterality: Dorsal, Left Cleanser: Soap and Water Every Other Day/30 Days Discharge Instructions: May shower and wash wound with dial antibacterial soap and water prior to dressing change. Topical: Gentamicin Every Other Day/30 Days Discharge Instructions: Apply directly to wound bed, under alginate Prim Dressing: KerraCel Ag Gelling Fiber Dressing, 4x5 in (silver alginate) Every Other Day/30 Days ary Discharge Instructions: Apply silver alginate to wound bed as instructed Secondary Dressing: Woven Gauze Sponge, Non-Sterile 4x4 in Every Other Day/30 Days Discharge Instructions: Apply over primary dressing as directed. Secured With: [employer], 4.5x3.1 (  in/yd) Every Other Day/30 Days Discharge Instructions: Secure with Kerlix as  directed. Secured With: [employer] T ape, 2x10 (in/yd) Every Other Day/30 Days Discharge Instructions: Secure dressing with tape as directed. 1. Everything looks a little better on his buttock and the area just about closed 2. Left dorsal foot probable wound infection. Deep tissue culture I did last week showed E. coli I have prescribed 7 days of cephalexin and topical gentamicin under the silver alginate 3. Silver alginate to continue to all wound areas. The area on the left dorsal foot underwent a surgical debridement with a #5 curette we also used silver nitrate on the F10 at the left anterior lower leg to treat the hyper granulated friable tissue here. Electronic Signature(s) Signed: 08/11/2021 4:30:53 PM By: Linton Ham MD Entered By: Linton Ham on 08/11/2021 11:45:19 -------------------------------------------------------------------------------- SuperBill Details Patient Name: Date of Service: Perfect, [name] 08/11/2021 Medical Record Number: [id number] Patient Account Number: [id number] Date of Birth/Sex: Treating RN: [birth year] (35 y.o. [name]: [place], [place] Other Clinician: Referring Provider: Treating Provider/Extender: [name]: 292 Diagnosis Coding ICD-10 Codes Code Description I87.332 Chronic venous hypertension (idiopathic) with ulcer and inflammation of left lower extremity L97.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, stage 3 L97.821 Non-pressure chronic ulcer of other part of left lower leg limited to breakdown of skin L97.521 Non-pressure chronic ulcer of other part of left foot limited to breakdown of skin G82.21 Paraplegia, complete Facility Procedures CPT4 Code: [id number] Description: 42683 - DEB SUBQ TISSUE 20 SQ CM/< ICD-10 Diagnosis Description L97.521 Non-pressure chronic ulcer of  other part of left foot limited to breakdown of s Modifier: kin Quantity: 1 CPT4 Code: [id number] Description: 79892 - CHEM CAUT GRANULATION TISS ICD-10 Diagnosis Description L97.821 Non-pressure chronic ulcer of other part of left lower leg limited to breakdown Modifier: 59 of skin Quantity: 1 Physician Procedures : CPT4 Code Description Modifier [id number] 11042 - WC PHYS SUBQ TISS 20 SQ CM ICD-10 Diagnosis Description L97.521 Non-pressure chronic ulcer of other part of left foot limited to breakdown of skin Quantity: 1 : [id number] - WC PHYS CHEM CAUT GRAN TISSUE 59 ICD-10 Diagnosis Description L97.821 Non-pressure chronic ulcer of other part of left lower leg limited to breakdown of skin Quantity: 1 Electronic Signature(s) Signed: 08/11/2021 4:30:53 PM By: Linton Ham MD Entered By: Linton Ham on 08/11/2021 11:46:45

## 2021-08-25 ENCOUNTER — Encounter (HOSPITAL_BASED_OUTPATIENT_CLINIC_OR_DEPARTMENT_OTHER): Payer: BC Managed Care – PPO | Attending: Internal Medicine | Admitting: Internal Medicine

## 2021-08-25 ENCOUNTER — Other Ambulatory Visit: Payer: Self-pay

## 2021-08-25 DIAGNOSIS — L89323 Pressure ulcer of left buttock, stage 3: Secondary | ICD-10-CM | POA: Diagnosis not present

## 2021-08-25 DIAGNOSIS — L97522 Non-pressure chronic ulcer of other part of left foot with fat layer exposed: Secondary | ICD-10-CM | POA: Diagnosis not present

## 2021-08-25 DIAGNOSIS — L97521 Non-pressure chronic ulcer of other part of left foot limited to breakdown of skin: Secondary | ICD-10-CM | POA: Diagnosis not present

## 2021-08-25 DIAGNOSIS — L97519 Non-pressure chronic ulcer of other part of right foot with unspecified severity: Secondary | ICD-10-CM | POA: Diagnosis not present

## 2021-08-25 DIAGNOSIS — I87332 Chronic venous hypertension (idiopathic) with ulcer and inflammation of left lower extremity: Secondary | ICD-10-CM | POA: Diagnosis not present

## 2021-08-25 DIAGNOSIS — L97821 Non-pressure chronic ulcer of other part of left lower leg limited to breakdown of skin: Secondary | ICD-10-CM | POA: Diagnosis not present

## 2021-08-25 DIAGNOSIS — G6289 Other specified polyneuropathies: Secondary | ICD-10-CM | POA: Diagnosis not present

## 2021-08-25 DIAGNOSIS — G8221 Paraplegia, complete: Secondary | ICD-10-CM | POA: Diagnosis not present

## 2021-08-25 DIAGNOSIS — L97511 Non-pressure chronic ulcer of other part of right foot limited to breakdown of skin: Secondary | ICD-10-CM | POA: Diagnosis not present

## 2021-08-25 DIAGNOSIS — L97512 Non-pressure chronic ulcer of other part of right foot with fat layer exposed: Secondary | ICD-10-CM | POA: Diagnosis not present

## 2021-08-26 NOTE — Progress Notes (Signed)
Walther, RAYMOUND KATICH ([id number]) Visit Report for 08/25/2021 Arrival Information Details Patient Name: Date of Service: Verno, [name] 08/25/2021 8:00 A M Medical Record Number: [id number] Patient Account Number: [id number] Date of Birth/Sex: Treating RN: 11-21-1986 (35 y.o. [name] Primary Care Melyna Huron: [place] Other Clinician: Referring Lewis Grivas: Treating Karyss Frese/Extender: [name]: 294 Visit Information History Since Last Visit Added or deleted any medications: No Patient Arrived: Wheel Chair Any new allergies or adverse reactions: No Arrival Time: 08:05 Had a fall or experienced change in No Transfer Assistance: None activities of daily living that may affect Patient Identification Verified: Yes risk of falls: Secondary Verification Process Completed: Yes Signs or symptoms of abuse/neglect since last visito No Patient Requires Transmission-Based Precautions: No Hospitalized since last visit: No Patient Has Alerts: Yes Implantable device outside of the clinic excluding No Patient Alerts: R ABI = 1.0 cellular tissue based products placed in the center L ABI = 1.1 since last visit: Has Dressing in Place as Prescribed: Yes Has Compression in Place as Prescribed: Yes Pain Present Now: No Electronic Signature(s) Signed: 08/25/2021 5:11:46 PM By: Lorrin Jackson Entered By: Lorrin Jackson on 08/25/2021 08:06:16 -------------------------------------------------------------------------------- Encounter Discharge Information Details Patient Name: Date of Service: Sterba, [name] 08/25/2021 8:00 A M Medical Record Number: [id number] Patient Account Number: [id number] Date of Birth/Sex: Treating RN: [birth year] (35 y.o. [name] Primary Care Yarel Rushlow: [place], [place] Other Clinician: Referring Amedee Cerrone: Treating Loriann Bosserman/Extender: [name]: 294 Encounter Discharge Information Items  Post Procedure Vitals Discharge Condition: Stable Temperature (F): 98.1 Ambulatory Status: Wheelchair Pulse (bpm): 97 Discharge Destination: Home Respiratory Rate (breaths/min): 18 Transportation: Private Auto Blood Pressure (mmHg): 125/76 Schedule Follow-up Appointment: Yes Clinical Summary of Care: Provided on 08/25/2021 Form Type Recipient Paper Patient Patient Electronic Signature(s) Signed: 08/25/2021 10:29:31 AM By: Lorrin Jackson Entered By: Lorrin Jackson on 08/25/2021 10:29:31 -------------------------------------------------------------------------------- Lower Extremity Assessment Details Patient Name: Date of Service: Tetzlaff, [name] 08/25/2021 8:00 A M Medical Record Number: [id number] Patient Account Number: [id number] Date of Birth/Sex: Treating RN: December 16, 1985 (35 y.o. [name] Primary Care Sherre Wooton: [place], [place] Other Clinician: Referring Sheylin Scharnhorst: Treating Elisabel Hanover/Extender: Dutch Gray, [name]: 294 Edema Assessment Assessed: [Left: Yes] [Right: Yes] Edema: [Left: Yes] [Right: Yes] Calf Left: Right: Point of Measurement: 33 cm From Medial Instep 28.4 cm 33 cm Ankle Left: Right: Point of Measurement: 10 cm From Medial Instep 25 cm 24 cm Vascular Assessment Pulses: Dorsalis Pedis Palpable: [Left:Yes] [Right:Yes] Electronic Signature(s) Signed: 08/25/2021 5:11:46 PM By: Lorrin Jackson Entered By: Lorrin Jackson on 08/25/2021 08:33:59 -------------------------------------------------------------------------------- Multi Wound Chart Details Patient Name: Date of Service: Radi, [name] 08/25/2021 8:00 A M Medical Record Number: [id number] Patient Account Number: [id number] Date of Birth/Sex: Treating RN: [birth year] (35 y.o. [name] Primary Care Tomy Khim: [name], [name] Other Clinician: Referring Lanell Carpenter: Treating Burgandy Hackworth/Extender: Dutch Gray, [name]: 294 Vital  Signs Height(in): 70 Pulse(bpm): 97 Weight(lbs): 216 Blood Pressure(mmHg): 125/76 Body Mass Index(BMI): 31 Temperature(F): 98.1 Respiratory Rate(breaths/min): 18 Photos: [41R:Left Ischium] [51:Left, Anterior Lower Leg] [52:Right, Dorsal Foot] Wound Location: [41R:Gradually Appeared] [51:Trauma] [52:Gradually Appeared] Wounding Event: [41R:Pressure Ulcer] [51:Trauma, Other] [52:Trauma, Other] Primary Etiology: [41R:Sleep Apnea, Hypertension, Paraplegia Sleep Apnea, Hypertension, Paraplegia Sleep Apnea, Hypertension, Paraplegia] Comorbid History: [41R:03/16/2020] [51:01/26/2021] [52:03/27/2021] Date Acquired: [41R:75] [51:27] [52:21] Weeks of Treatment: [41R:Open] [51:Open] [52:Open] Wound Status: [41R:Yes] [51:No] [52:No] Wound Recurrence: [41R:Yes] [51:No] [52:No] Clustered Wound: [41R:1] [51:N/A] [52:N/A] Clustered Quantity: [41R:0.5x0.5x0.1] [51:1.4x1.6x0.1] [  52:3x4x0.1] Measurements L x W x D (cm) [41R:0.196] [51:1.759] [52:9.425] A (cm) : rea [41R:0.02] [51:0.176] [52:0.942] Volume (cm) : [41R:89.10%] [51:-433.00%] [52:-400.00%] % Reduction in A rea: [41R:89.00%] [51:-433.30%] [52:-401.10%] % Reduction in Volume: [41R:Category/Stage II] [51:Full Thickness Without Exposed] [52:Full Thickness Without Exposed] Classification: [41R:Medium] [51:Support Structures Medium] [52:Support Structures Medium] Exudate A mount: [41R:Sanguinous] [51:Serosanguineous] [52:Purulent] Exudate Type: [41R:red] [51:red, brown] [52:yellow, brown, green] Exudate Color: [41R:Distinct, outline attached] [51:Distinct, outline attached] [52:Distinct, outline attached] Wound Margin: [41R:Large (67-100%)] [51:Large (67-100%)] [52:Medium (34-66%)] Granulation A mount: [41R:Red, Friable] [51:Red, Hyper-granulation, Friable] [52:Pink] Granulation Quality: [41R:None Present (0%)] [51:None Present (0%)] [52:Medium (34-66%)] Necrotic A mount: [41R:Fat Layer (Subcutaneous Tissue): Yes Fat Layer (Subcutaneous Tissue):  Yes Fat Layer (Subcutaneous Tissue): Yes] Exposed Structures: [41R:Fascia: No Tendon: No Muscle: No Joint: No Bone: No Medium (34-66%)] [51:Fascia: No Tendon: No Muscle: No Joint: No Bone: No Small (1-33%)] [52:Fascia: No Tendon: No Muscle: No Joint: No Bone: No None] Epithelialization: [41R:N/A] [51:N/A] [52:Debridement - Excisional] Debridement: Pre-procedure Verification/Time Out N/A [51:N/A] [52:08:34] Taken: [41R:N/A] [51:N/A] [52:Subcutaneous, Slough] Tissue Debrided: [41R:N/A] [51:N/A] [52:Skin/Subcutaneous Tissue] Level: [41R:N/A] [51:N/A] [52:12] Debridement A (sq cm): [41R:rea N/A] [51:N/A] [52:Curette] Instrument: [41R:N/A] [51:N/A] [52:Moderate] Bleeding: [41R:N/A] [51:N/A] [52:Silver Nitrate] Hemostasis A chieved: [41R:N/A] [51:N/A] [52:Procedure was tolerated well] Debridement Treatment Response: [41R:N/A] [51:N/A] [52:3x4x0.1] Post Debridement Measurements L x W x D (cm) [41R:N/A] [51:N/A] [52:0.942] Post Debridement Volume: (cm) [41R:N/A] [51:periwound erythema] [52:maceration and periwound redness] Assessment Notes: [41R:N/A] [51:N/A] [52:Debridement] Wound Number: [id number] Photos: Left, Distal Ischium Left T Great oe Left, Dorsal Foot Wound Location: Pressure Injury Blister Gradually Appeared Wounding Event: Pressure Ulcer 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 Primary Etiology: Sleep Apnea, Hypertension, Paraplegia Sleep Apnea, Hypertension, Paraplegia Sleep Apnea, Hypertension, Paraplegia Comorbid History: 05/11/2021 05/26/2021 07/11/2021 Date Acquired: 15 13 5  Weeks of Treatment: Open Open Open Wound Status: No No No Wound Recurrence: No No No Clustered Wound: N/A N/A N/A Clustered Quantity: 0.8x0.5x0.1 0.6x1.2x0.2 3x3.7x0.1 Measurements L x W x D (cm) 0.314 0.565 8.718 A (cm) : rea 0.031 0.113 0.872 Volume (cm) : 77.80% 28.00% -40.20% % Reduction in A rea: 78.00% -43.00% -40.20% % Reduction in Volume: Category/Stage III  Full Thickness With Exposed Support Full Thickness Without Exposed Classification: Structures Support Structures Medium Medium Medium Exudate Amount: Sanguinous Purulent Purulent Exudate Type: red yellow, brown, green yellow, brown, green Exudate Color: Distinct, outline attached Distinct, outline attached Distinct, outline attached Wound Margin: Large (67-100%) Medium (34-66%) Small (1-33%) Granulation Amount: Red, Friable Pink Pink Granulation Quality: None Present (0%) Medium (34-66%) Large (67-100%) Necrotic Amount: Fat Layer (Subcutaneous Tissue): Yes Fat Layer (Subcutaneous Tissue): Yes Fat Layer (Subcutaneous Tissue): Yes Exposed Structures: Fascia: No Fascia: No Fascia: No Tendon: No Tendon: No Tendon: No Muscle: No Muscle: No Muscle: No Joint: No Joint: No Joint: No Bone: No Bone: No Bone: No Medium (34-66%) None None Epithelialization: N/A N/A Debridement - Excisional Debridement: Pre-procedure Verification/Time Out N/A N/A 08:34 Taken: N/A N/A Subcutaneous, Slough Tissue Debrided: N/A N/A Skin/Subcutaneous Tissue Level: N/A N/A 11.1 Debridement A (sq cm): rea N/A N/A Curette Instrument: N/A N/A Moderate Bleeding: N/A N/A Silver Nitrate Hemostasis A chieved: N/A N/A Procedure was tolerated well Debridement Treatment Response: N/A N/A 3x3.7x0.1 Post Debridement Measurements L x W x D (cm) N/A N/A 0.872 Post Debridement Volume: (cm) N/A periwound erythema maceration and periwound erythema Assessment Notes: N/A N/A Debridement Procedures Performed: Wound Number: [id number] N/A N/A Photos: N/A N/A Right, Plantar Foot N/A N/A Wound Location: Gradually Appeared N/A N/A Wounding Event: Neuropathic  Ulcer-Non Diabetic N/A N/A Primary Etiology: Sleep Apnea, Hypertension, Paraplegia N/A N/A Comorbid History: 08/25/2021 N/A N/A Date Acquired: 0 N/A N/A Weeks of Treatment: Open N/A N/A Wound Status: No N/A N/A Wound Recurrence: No N/A  N/A Clustered Wound: N/A N/A N/A Clustered Quantity: 1.3x1x0.1 N/A N/A Measurements L x W x D (cm) 1.021 N/A N/A A (cm) : rea 0.102 N/A N/A Volume (cm) : 0.00% N/A N/A % Reduction in A rea: 0.00% N/A N/A % Reduction in Volume: Full Thickness Without Exposed N/A N/A Classification: Support Structures Medium N/A N/A Exudate A mount: Purulent N/A N/A Exudate Type: yellow, brown, green N/A N/A Exudate Color: Distinct, outline attached N/A N/A Wound Margin: Medium (34-66%) N/A N/A Granulation A mount: Red N/A N/A Granulation Quality: Medium (34-66%) N/A N/A Necrotic A mount: Fat Layer (Subcutaneous Tissue): Yes N/A N/A Exposed Structures: Fascia: No Tendon: No Muscle: No Joint: No Bone: No None N/A N/A Epithelialization: N/A N/A N/A Debridement: N/A N/A N/A Tissue Debrided: N/A N/A N/A Level: N/A N/A N/A Debridement A (sq cm): rea N/A N/A N/A Instrument: N/A N/A N/A Bleeding: N/A N/A N/A Hemostasis A chieved: Debridement Treatment Response: N/A N/A N/A Post Debridement Measurements L x N/A N/A N/A W x D (cm) N/A N/A N/A Post Debridement Volume: (cm) N/A N/A N/A Assessment Notes: N/A N/A N/A Procedures Performed: Treatment Notes Electronic Signature(s) Signed: 08/25/2021 5:26:57 PM By: Rhae Hammock RN Signed: 08/26/2021 10:14:24 AM By: Linton Ham MD Entered By: Linton Ham on 08/25/2021 08:57:34 -------------------------------------------------------------------------------- Multi-Disciplinary Care Plan Details Patient Name: Date of Service: Maranan, [name] 08/25/2021 8:00 A M Medical Record Number: [id number] Patient Account Number: [id number] Date of Birth/Sex: Treating RN: 1986-09-19 (35 y.o. [name] Primary Care Marcela Alatorre: [name], [name] Other Clinician: Referring Leone Mobley: Treating Annely Sliva/Extender: [name]: [place] reviewed with physician Active  Inactive Wound/Skin Impairment Nursing Diagnoses: Impaired tissue integrity Knowledge deficit related to ulceration/compromised skin integrity Goals: Patient/caregiver will verbalize understanding of skin care regimen Date Initiated: 01/05/2016 Target Resolution Date: 09/04/2021 Goal Status: Active Ulcer/skin breakdown will have a volume reduction of 30% by week 4 Date Initiated: 01/05/2016 Date Inactivated: 12/22/2017 Target Resolution Date: 01/19/2018 Unmet Reason: complex wounds, Goal Status: Unmet infection Interventions: Assess patient/caregiver ability to obtain necessary supplies Assess ulceration(s) every visit Provide education on ulcer and skin care Notes: 02/02/21: Complex Care, ongoing. Electronic Signature(s) Signed: 08/25/2021 5:11:46 PM By: Lorrin Jackson Entered By: Lorrin Jackson on 08/25/2021 08:34:36 -------------------------------------------------------------------------------- Pain Assessment Details Patient Name: Date of Service: Such, [name] 08/25/2021 8:00 A M Medical Record Number: [id number] Patient Account Number: [id number] Date of Birth/Sex: Treating RN: 07-22-1986 (35 y.o. [name] Primary Care Shafer Swamy: [place], [place] Other Clinician: Referring Daryn Pisani: Treating Porsha Skilton/Extender: [name]: 294 Active Problems Location of Pain Severity and Description of Pain Patient Has Paino No Site Locations Pain Management and Medication Current Pain Management: Electronic Signature(s) Signed: 08/25/2021 5:11:46 PM By: Lorrin Jackson Entered By: Lorrin Jackson on 08/25/2021 08:06:50 -------------------------------------------------------------------------------- Patient/Caregiver Education Details Patient Name: Date of Service: Colantuono, A Viviann Spare 10/4/2022andnbsp8:00 A M Medical Record Number: [id number] Patient Account Number: [id number] Date of Birth/Gender: Treating RN: [birth year] (35 y.o. [name] Primary Care Physician: Janine Limbo Other Clinician: Referring Physician: Treating Physician/Extender: [name]: 294 Education Assessment Education Provided To: Patient Education Topics Provided Venous: Methods: Explain/Verbal, Printed Responses: State content correctly Wound/Skin Impairment: Methods: Explain/Verbal, Printed Responses: State content correctly  Electronic Signature(s) Signed: 08/25/2021 5:11:46 PM By: Lorrin Jackson Entered By: Lorrin Jackson on 08/25/2021 08:35:46 -------------------------------------------------------------------------------- Wound Assessment Details Patient Name: Date of Service: Geddis, [name] 08/25/2021 8:00 A M Medical Record Number: [id number] Patient Account Number: [id number] Date of Birth/Sex: Treating RN: 20-Sep-1986 (35 y.o. [name] Primary Care Eretria Manternach: [name], [name] Other Clinician: Referring Josephine Rudnick: Treating Cosandra Plouffe/Extender: [name]: 294 Wound Status Wound Number: [id number] Primary Etiology: Pressure Ulcer Wound Location: Left Ischium Wound Status: Open Wounding Event: Gradually Appeared Comorbid History: Sleep Apnea, Hypertension, Paraplegia Date Acquired: 03/16/2020 Weeks Of Treatment: 75 Clustered Wound: Yes Photos Wound Measurements Length: (cm) 0.5 Width: (cm) 0.5 Depth: (cm) 0.1 Clustered Quantity: 1 Area: (cm) 0.196 Volume: (cm) 0.02 % Reduction in Area: 89.1% % Reduction in Volume: 89% Epithelialization: Medium (34-66%) Tunneling: No Undermining: No Wound Description Classification: Category/Stage II Wound Margin: Distinct, outline attached Exudate Amount: Medium Exudate Type: Sanguinous Exudate Color: red Foul Odor After Cleansing: No Slough/Fibrino No Wound Bed Granulation Amount: Large (67-100%) Exposed Structure Granulation Quality: Red, Friable Fascia Exposed: No Necrotic Amount: None  Present (0%) Fat Layer (Subcutaneous Tissue) Exposed: Yes Tendon Exposed: No Muscle Exposed: No Joint Exposed: No Bone Exposed: No Treatment Notes Wound #41R (Ischium) Wound Laterality: Left Cleanser Soap and Water Discharge Instruction: May shower and wash wound with dial antibacterial soap and water prior to dressing change. Peri-Wound Care Topical Primary Dressing Secondary Dressing ABD Pad, 5x9 Discharge Instruction: Apply over primary dressing as directed. Secured With 44M Medipore H Soft Cloth Surgical T 4 x 2 (in/yd) ape Discharge Instruction: Secure dressing with tape as directed. Compression Wrap Compression Stockings Add-Ons Electronic Signature(s) Signed: 08/25/2021 5:11:46 PM By: Lorrin Jackson Entered By: Lorrin Jackson on 08/25/2021 08:27:29 -------------------------------------------------------------------------------- Wound Assessment Details Patient Name: Date of Service: Mahabir, [name] 08/25/2021 8:00 A M Medical Record Number: [id number] Patient Account Number: [id number] Date of Birth/Sex: Treating RN: 05/28/1986 (35 y.o. [name] Primary Care Ronnald Shedden: [name], [name] Other Clinician: Referring Benedetta Sundstrom: Treating Canden Cieslinski/Extender: [name]: 294 Wound Status Wound Number: 51 Primary Etiology: Trauma, Other Wound Location: Left, Anterior Lower Leg Wound Status: Open Wounding Event: Trauma Comorbid History: Sleep Apnea, Hypertension, Paraplegia Date Acquired: 01/26/2021 Weeks Of Treatment: 27 Clustered Wound: No Photos Wound Measurements Length: (cm) 1.4 Width: (cm) 1.6 Depth: (cm) 0.1 Area: (cm) 1.759 Volume: (cm) 0.176 % Reduction in Area: -433% % Reduction in Volume: -433.3% Epithelialization: Small (1-33%) Tunneling: No Undermining: No Wound Description Classification: Full Thickness Without Exposed Support Structures Wound Margin: Distinct, outline attached Exudate Amount:  Medium Exudate Type: Serosanguineous Exudate Color: red, brown Foul Odor After Cleansing: No Slough/Fibrino No Wound Bed Granulation Amount: Large (67-100%) Exposed Structure Granulation Quality: Red, Hyper-granulation, Friable Fascia Exposed: No Necrotic Amount: None Present (0%) Fat Layer (Subcutaneous Tissue) Exposed: Yes Tendon Exposed: No Muscle Exposed: No Joint Exposed: No Bone Exposed: No Assessment Notes periwound erythema Treatment Notes Wound #51 (Lower Leg) Wound Laterality: Left, Anterior Cleanser Soap and Water Discharge Instruction: May shower and wash wound with dial antibacterial soap and water prior to dressing change. Peri-Wound Care Topical Primary Dressing Hydrofera Blue Ready Foam, 4x5 in Discharge Instruction: Apply to wound bed as instructed Secondary Dressing ComfortFoam Border, 4x4 in (silicone border) Discharge Instruction: Apply over primary dressing as directed. Secured With Compression Wrap Compression Stockings Environmental education officer) Signed: 08/25/2021 5:11:46 PM By: Lorrin Jackson Entered By: Lorrin Jackson on 08/25/2021 08:30:02 -------------------------------------------------------------------------------- Wound Assessment Details Patient Name: Date of Service:  Kauffman, [name] 08/25/2021 8:00 A M Medical Record Number: [id number] Patient Account Number: [id number] Date of Birth/Sex: Treating RN: [birth year] (35 y.o. [name] Primary Care Cataleya Cristina: [name], [name] Other Clinician: Referring Natali Lavallee: Treating Milliani Herrada/Extender: [name]: 294 Wound Status Wound Number: 52 Primary Etiology: Trauma, Other Wound Location: Right, Dorsal Foot Wound Status: Open Wounding Event: Gradually Appeared Comorbid History: Sleep Apnea, Hypertension, Paraplegia Date Acquired: 03/27/2021 Weeks Of Treatment: 21 Clustered Wound: No Photos Wound Measurements Length: (cm) 3 Width: (cm)  4 Depth: (cm) 0.1 Area: (cm) 9.425 Volume: (cm) 0.942 % Reduction in Area: -400% % Reduction in Volume: -401.1% Epithelialization: None Tunneling: No Undermining: No Wound Description Classification: Full Thickness Without Exposed Support Struct Wound Margin: Distinct, outline attached Exudate Amount: Medium Exudate Type: Purulent Exudate Color: yellow, brown, green ures Foul Odor After Cleansing: No Slough/Fibrino Yes Wound Bed Granulation Amount: Medium (34-66%) Exposed Structure Granulation Quality: Pink Fascia Exposed: No Necrotic Amount: Medium (34-66%) Fat Layer (Subcutaneous Tissue) Exposed: Yes Necrotic Quality: Adherent Slough Tendon Exposed: No Muscle Exposed: No Joint Exposed: No Bone Exposed: No Assessment Notes maceration and periwound redness Treatment Notes Wound #52 (Foot) Wound Laterality: Dorsal, Right Cleanser Soap and Water Discharge Instruction: May shower and wash wound with dial antibacterial soap and water prior to dressing change. Peri-Wound Care Topical Gentamicin Discharge Instruction: As directed by physician Primary Dressing Hydrofera Blue Ready Foam, 2.5 x2.5 in Discharge Instruction: Apply to wound bed as instructed Secondary Dressing Woven Gauze Sponge, Non-Sterile 4x4 in Discharge Instruction: Apply over primary dressing as directed. Secured With [employer], 4.5x3.1 (in/yd) Discharge Instruction: Secure with Kerlix as directed. Transpore Surgical Tape, 2x10 (in/yd) Discharge Instruction: Secure dressing with tape as directed. Compression Wrap Compression Stockings Add-Ons Electronic Signature(s) Signed: 08/25/2021 5:11:46 PM By: Lorrin Jackson Entered By: Lorrin Jackson on 08/25/2021 08:30:54 -------------------------------------------------------------------------------- Wound Assessment Details Patient Name: Date of Service: Lenhardt, [name] 08/25/2021 8:00 A M Medical Record Number: [id number] Patient Account  Number: [id number] Date of Birth/Sex: Treating RN: [birth year] (35 y.o. [name] Primary Care Juan Kissoon: [name], [name] Other Clinician: Referring Bessye Stith: Treating Teshawn Moan/Extender: [name]: 294 Wound Status Wound Number: 54 Primary Etiology: Pressure Ulcer Wound Location: Left, Distal Ischium Wound Status: Open Wounding Event: Pressure Injury Comorbid History: Sleep Apnea, Hypertension, Paraplegia Date Acquired: 05/11/2021 Weeks Of Treatment: 15 Clustered Wound: No Photos Wound Measurements Length: (cm) 0.8 Width: (cm) 0.5 Depth: (cm) 0.1 Area: (cm) 0.314 Volume: (cm) 0.031 % Reduction in Area: 77.8% % Reduction in Volume: 78% Epithelialization: Medium (34-66%) Tunneling: No Undermining: No Wound Description Classification: Category/Stage III Wound Margin: Distinct, outline attached Exudate Amount: Medium Exudate Type: Sanguinous Exudate Color: red Foul Odor After Cleansing: No Slough/Fibrino No Wound Bed Granulation Amount: Large (67-100%) Exposed Structure Granulation Quality: Red, Friable Fascia Exposed: No Necrotic Amount: None Present (0%) Fat Layer (Subcutaneous Tissue) Exposed: Yes Tendon Exposed: No Muscle Exposed: No Joint Exposed: No Bone Exposed: No Treatment Notes Wound #54 (Ischium) Wound Laterality: Left, Distal Cleanser Soap and Water Discharge Instruction: May shower and wash wound with dial antibacterial soap and water prior to dressing change. Peri-Wound Care Topical Primary Dressing Secondary Dressing ABD Pad, 5x9 Discharge Instruction: Apply over primary dressing as directed. ComfortFoam Border, 4x4 in (silicone border) Discharge Instruction: Apply over primary dressing as directed. Secured With 52M Medipore H Soft Cloth Surgical T 4 x 2 (in/yd) ape Discharge Instruction: Secure dressing with tape as directed. Compression Wrap Compression Stockings Add-Ons Electronic  Signature(s) Signed: 08/25/2021 5:11:46 PM By: Lorrin Jackson Entered By: Lorrin Jackson on 08/25/2021 08:29:14 -------------------------------------------------------------------------------- Wound Assessment Details Patient Name: Date of Service: Hibler, [name] 08/25/2021 8:00 A M Medical Record Number: [id number] Patient Account Number: [id number] Date of Birth/Sex: Treating RN: 06/10/1986 (35 y.o. [name] Primary Care Camiah Humm: [name], [name] Other Clinician: Referring Aniella Wandrey: Treating Dontavious Emily/Extender: [name]: 294 Wound Status Wound Number: 55 Primary Etiology: Neuropathic Ulcer-Non Diabetic Wound Location: Left T Great oe Wound Status: Open Wounding Event: Blister Comorbid History: Sleep Apnea, Hypertension, Paraplegia Date Acquired: 05/26/2021 Weeks Of Treatment: 13 Clustered Wound: No Photos Wound Measurements Length: (cm) 0.6 Width: (cm) 1.2 Depth: (cm) 0.2 Area: (cm) 0.565 Volume: (cm) 0.113 % Reduction in Area: 28% % Reduction in Volume: -43% Epithelialization: None Tunneling: No Undermining: No Wound Description Classification: Full Thickness With Exposed Support Structures Wound Margin: Distinct, outline attached Exudate Amount: Medium Exudate Type: Purulent Exudate Color: yellow, brown, green Foul Odor After Cleansing: No Slough/Fibrino Yes Wound Bed Granulation Amount: Medium (34-66%) Exposed Structure Granulation Quality: Pink Fascia Exposed: No Necrotic Amount: Medium (34-66%) Fat Layer (Subcutaneous Tissue) Exposed: Yes Necrotic Quality: Adherent Slough Tendon Exposed: No Muscle Exposed: No Joint Exposed: No Bone Exposed: No Assessment Notes periwound erythema Treatment Notes Wound #55 (Toe Great) Wound Laterality: Left Cleanser Soap and Water Discharge Instruction: May shower and wash wound with dial antibacterial soap and water prior to dressing change. Peri-Wound  Care Topical Primary Dressing Hydrofera Blue Ready Foam, 2.5 x2.5 in Discharge Instruction: Apply to wound bed as instructed Secondary Dressing Woven Gauze Sponge, Non-Sterile 4x4 in Discharge Instruction: Apply over primary dressing as directed. Secured With [employer], 4.5x3.1 (in/yd) Discharge Instruction: Secure with Kerlix as directed. Transpore Surgical Tape, 2x10 (in/yd) Discharge Instruction: Secure dressing with tape as directed. Compression Wrap Compression Stockings Add-Ons Electronic Signature(s) Signed: 08/25/2021 5:11:46 PM By: Lorrin Jackson Entered By: Lorrin Jackson on 08/25/2021 08:31:42 -------------------------------------------------------------------------------- Wound Assessment Details Patient Name: Date of Service: Junkin, [name] 08/25/2021 8:00 A M Medical Record Number: [id number] Patient Account Number: [id number] Date of Birth/Sex: Treating RN: 1986/03/25 (35 y.o. [name] Primary Care Jareth Pardee: [name], [name] Other Clinician: Referring Yariana Hoaglund: Treating Medina Degraffenreid/Extender: [name]: 294 Wound Status Wound Number: 56 Primary Etiology: Neuropathic Ulcer-Non Diabetic Wound Location: Left, Dorsal Foot Wound Status: Open Wounding Event: Gradually Appeared Comorbid History: Sleep Apnea, Hypertension, Paraplegia Date Acquired: 07/11/2021 Weeks Of Treatment: 5 Clustered Wound: No Photos Wound Measurements Length: (cm) 3 Width: (cm) 3.7 Depth: (cm) 0.1 Area: (cm) 8.718 Volume: (cm) 0.872 % Reduction in Area: -40.2% % Reduction in Volume: -40.2% Epithelialization: None Tunneling: No Undermining: No Wound Description Classification: Full Thickness Without Exposed Support Structures Wound Margin: Distinct, outline attached Exudate Amount: Medium Exudate Type: Purulent Exudate Color: yellow, brown, green Foul Odor After Cleansing: No Slough/Fibrino Yes Wound Bed Granulation Amount:  Small (1-33%) Exposed Structure Granulation Quality: Pink Fascia Exposed: No Necrotic Amount: Large (67-100%) Fat Layer (Subcutaneous Tissue) Exposed: Yes Necrotic Quality: Adherent Slough Tendon Exposed: No Muscle Exposed: No Joint Exposed: No Bone Exposed: No Assessment Notes maceration and periwound erythema Treatment Notes Wound #56 (Foot) Wound Laterality: Dorsal, Left Cleanser Soap and Water Discharge Instruction: May shower and wash wound with dial antibacterial soap and water prior to dressing change. Peri-Wound Care Topical Gentamicin Discharge Instruction: Apply directly to wound bed, under alginate Primary Dressing Hydrofera Blue Classic Foam, 4x4 in Discharge Instruction: Moisten with saline prior to applying to wound  bed Secondary Dressing Woven Gauze Sponge, Non-Sterile 4x4 in Discharge Instruction: Apply over primary dressing as directed. Secured With [employer], 4.5x3.1 (in/yd) Discharge Instruction: Secure with Kerlix as directed. Transpore Surgical Tape, 2x10 (in/yd) Discharge Instruction: Secure dressing with tape as directed. Compression Wrap Compression Stockings Add-Ons Electronic Signature(s) Signed: 08/25/2021 5:11:46 PM By: Lorrin Jackson Entered By: Lorrin Jackson on 08/25/2021 08:32:38 -------------------------------------------------------------------------------- Wound Assessment Details Patient Name: Date of Service: [place], [name] 08/25/2021 8:00 A M Medical Record Number: [id number] Patient Account Number: [id number] Date of Birth/Sex: Treating RN: June 10, 1986 (35 y.o. [name] Primary Care Sanchez Hemmer: [name], [name] Other Clinician: Referring Deronda Christian: Treating Dquan Cortopassi/Extender: [name]: 294 Wound Status Wound Number: 57 Primary Etiology: Neuropathic Ulcer-Non Diabetic Wound Location: Right, Plantar Foot Wound Status: Open Wounding Event: Gradually Appeared Comorbid  History: Sleep Apnea, Hypertension, Paraplegia Date Acquired: 08/25/2021 Weeks Of Treatment: 0 Clustered Wound: No Photos Wound Measurements Length: (cm) 1.3 Width: (cm) 1 Depth: (cm) 0.1 Area: (cm) 1.021 Volume: (cm) 0.102 % Reduction in Area: 0% % Reduction in Volume: 0% Epithelialization: None Tunneling: No Undermining: No Wound Description Classification: Full Thickness Without Exposed Support Structures Wound Margin: Distinct, outline attached Exudate Amount: Medium Exudate Type: Purulent Exudate Color: yellow, brown, green Foul Odor After Cleansing: No Slough/Fibrino Yes Wound Bed Granulation Amount: Medium (34-66%) Exposed Structure Granulation Quality: Red Fascia Exposed: No Necrotic Amount: Medium (34-66%) Fat Layer (Subcutaneous Tissue) Exposed: Yes Necrotic Quality: Adherent Slough Tendon Exposed: No Muscle Exposed: No Joint Exposed: No Bone Exposed: No Treatment Notes Wound #57 (Foot) Wound Laterality: Plantar, Right Cleanser Soap and Water Discharge Instruction: May shower and wash wound with dial antibacterial soap and water prior to dressing change. Peri-Wound Care Topical Primary Dressing Hydrofera Blue Ready Foam, 2.5 x2.5 in Discharge Instruction: Apply to wound bed as instructed Secondary Dressing Woven Gauze Sponge, Non-Sterile 4x4 in Discharge Instruction: Apply over primary dressing as directed. Secured With [employer], 4.5x3.1 (in/yd) Discharge Instruction: Secure with Kerlix as directed. 33M Medipore H Soft Cloth Surgical T 4 x 2 (in/yd) ape Discharge Instruction: Secure dressing with tape as directed. Compression Wrap Compression Stockings Add-Ons Electronic Signature(s) Signed: 08/25/2021 5:11:46 PM By: Lorrin Jackson Entered By: Lorrin Jackson on 08/25/2021 08:33:23 -------------------------------------------------------------------------------- Vitals Details Patient Name: Date of Service: Guinyard, [name] 08/25/2021  8:00 A M Medical Record Number: [id number] Patient Account Number: [id number] Date of Birth/Sex: Treating RN: November 01, 1986 (35 y.o. [name] Primary Care Ann Bohne: [place], Bartholomew Other Clinician: Referring Alizea Pell: Treating Vickie Ponds/Extender: [name]: 294 Vital Signs Time Taken: 08:06 Temperature (F): 98.1 Height (in): 70 Pulse (bpm): 97 Weight (lbs): 216 Respiratory Rate (breaths/min): 18 Body Mass Index (BMI): 31 Blood Pressure (mmHg): 125/76 Reference Range: 80 - 120 mg / dl Electronic Signature(s) Signed: 08/25/2021 5:11:46 PM By: Lorrin Jackson Entered By: Lorrin Jackson on 08/25/2021 08:06:40

## 2021-08-26 NOTE — Progress Notes (Signed)
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 05/26/17; left lateral leg venous insufficiency wound. We've been using Hydrofera Blue. I believe that there is expanding epithelialization over the surface of the wound albeit not coming from the wound circumference. This is [name] bit of an odd situation in which the epithelialization seems to be coming from the surface of the wound rather than in the exact circumference. There is still small open areas mostly along the lateral margin of the wound. He has unchanged areas between the left first and second and the right first second toes which I been treating for tenia pedis 06/02/17; left lateral leg venous insufficiency wound. We have been using Hydrofera Blue. Somewhat smaller from the wound circumference. The surface of the wound remains [name] bit on it almost epithelialized sedation in appearance. I use an open curette today debridement in the surface of all of this especially the edges Small open wounds remaining on the dorsal right first and second toe interspace and the plantar left first second toe and her face on the left 06/09/17; wound on the left lateral leg continues to be smaller but very gradual and very dry surface using Hydrofera Blue 06/16/17 requires weekly debridements now on the left lateral leg although this continues to contract. I changed to silver collagen last week because of dryness of the wound bed. Using Iodoflex to the areas on his first and second toes/web space  bilaterally 06/24/17; patient with history of paraplegia also chronic venous insufficiency with lymphedema. Has [name] very difficult wound  on the left lateral leg. This has been gradually reducing in terms of with but comes in with [name] very dry adherent surface. High switch to silver collagen [name] week or so ago with hydrogel to keep the area moist. This is been refractory to multiple dressing attempts. He also has areas in his first and second toes bilaterally in the anterior and posterior web space. I had been using Iodoflex here after [name] prolonged course of silver alginate with ketoconazole was ineffective [question tinea pedis] 07/14/17; patient arrives today with [name] very difficult adherent material over his left lateral lower leg wound. He also has surrounding erythema and poorly controlled edema. He was switched his Santyl last visit which the nurses are applying once during his doctor visit and once on [name] nurse visit. He was also reduced to 2 layer compression I'm not exactly sure of the issue here. 07/21/17; better surface today after 1 week of Iodoflex. Significant cellulitis that we treated last week also better. [Doxycycline] 07/28/17 better surface today with now 2 weeks of Iodoflex. Significant cellulitis treated with doxycycline. He has now completed the doxycycline and he is back to his usual degree of chronic venous inflammation/stasis dermatitis. He reminds me he has had ablations surgery here 08/04/17; continued improvement with Iodoflex to the left lateral leg wound in terms of the surface of the wound although the dimensions are better. 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 Problematic areas on the plantar aspect of the first second toe web space on the left and the dorsal aspect of the first second toe web space on the right. At one point I felt these were probably related to chronic fungal infections in treated him  aggressively for this although we have not made any improvement here. 08/11/17; left lateral leg. Surface continues to improve with the Iodoflex although we are not seeing much improvement in overall wound dimensions. Areas on his plantar left foot and right foot show no improvement. In fact the right foot looks somewhat worse 08/18/17; left lateral leg. We changed to [facility] Blue last week after [name] prolonged course of Iodoflex which helps get the surface better. It appears that the wound with is improved. Continue with difficult areas on the left dorsal first second and plantar first second on the right 09/01/17; patient arrives in clinic today having had [name] temperature of 103 yesterday. He was seen in the ER and [facility]. The patient was concerned he could have cellulitis again in the right leg however they diagnosed him with [name] UTI and he is now on Keflex. He has [name] history of cellulitis which is been recurrent and difficult but this is been in the left leg, in the past 5 use doxycycline. He does in and out catheterizations at home which are risk factors for UTI 09/08/17; patient will be completing his Keflex this weekend. The erythema on the left leg is considerably better. He has [name] new wound today on the medial part of the right leg small superficial almost looks like [name] skin tear. He has worsening of the area on the right dorsal first and second toe. His major area on the left lateral leg is better. Using Hydrofera Blue on all areas 09/15/17; gradual reduction in width on the long wound in the left lateral leg. No debridement required. He also has wounds on the plantar aspect of his left first second toe web space and on the dorsal aspect of the right  first second toe web space. 09/22/17; there continues to be very gradual improvements in the dimensions of the left lateral leg wound. He hasn't round erythematous spot with might be pressure on his wheelchair. There is no evidence obviously of infection  no purulence no warmth He has [name] dry scaled area on the plantar aspect of the left first second toe Improved area on the dorsal right first second toe. 09/29/17; left lateral leg wound continues to improve in dimensions mostly with an is still [name] fairly long but increasingly narrow wound. He has [name] dry scaled area on the plantar aspect of his left first second toe web space Increasingly concerning area on the dorsal right first second toe. In fact I am concerned today about possible cellulitis around this wound. The areas extending up his second toe and although there is deformities here almost appears to abut on the nailbed. 10/06/17; left lateral leg wound continues to make very gradual progress. Tissue culture I did from the right first second toe dorsal foot last time grew MRSA and enterococcus which was vancomycin sensitive. This was not sensitive to clindamycin or doxycycline. He is allergic to Zyvox and sulfa we have therefore arrange for him to have dalvance infusion tomorrow. He is had this in the past and tolerated it well 10/20/17; left lateral leg wound continues to make decent progress. This is certainly reduced in terms of with there is advancing epithelialization.The cellulitis in the right foot looks better although he still has [name] deep wound in the dorsal aspect of the first second toe web space. Plantar left first toe web space on the left I think is making some progress 10/27/17; left lateral leg wound continues to make decent progress. Advancing epithelialization.using Hydrofera Blue The right first second toe web space wound is better-looking using silver alginate Improvement in the left plantar first second toe web space. Again using silver alginate 11/03/17 left lateral leg wound continues to make decent progress albeit slowly. Using [facility] The right per second toe web space continues to be [name] very problematic looking punched out wound. I obtained [name] piece of tissue for deep  culture I did extensively treated this for fungus. It is difficult to imagine that this is [name] pressure area as the patient states other than going outside he doesn't really wear shoes at home The left plantar first second toe web space looked fairly senescent. Necrotic edges. This required debridement change to [facility] Blue to all wound areas 11/10/17; left lateral leg wound continues to contract. Using Hydrofera Blue On the right dorsal first second toe web space dorsally. Culture I did of this area last week grew MRSA there is not an easy oral option in this patient was multiple antibiotic allergies or intolerances. This was only [name] rare culture isolate I'm therefore going to use Bactroban under silver alginate On the left plantar first second toe web space. Debridement is required here. This is also unchanged 11/17/17; left lateral leg wound continues to contract using Hydrofera Blue this is no longer the major issue. The major concern here is the right first second toe web space. He now has an open area going from dorsally to the plantar aspect. There is now wound on the inner lateral part of the first toe. Not [name] very viable surface on this. There is erythema spreading medially into the forefoot. No major change in the left first second toe plantar wound 11/24/17; left lateral leg wound continues to contract using Hydrofera Blue. Nice improvement  today The right first second toe web space all of this looks [name] lot less angry than last week. I have given him clindamycin and topical Bactroban for MRSA and terbinafine for the possibility of underlining tinea pedis that I could not control with ketoconazole. Looks somewhat better The area on the plantar left first second toe web space is weeping with dried debris around the wound 12/01/17; left lateral leg wound continues to contract he Hydrofera Blue. It is becoming thinner in terms of with nevertheless it is making good improvement. The right first  second toe web space looks less angry but still [name] large necrotic-looking wounds starting on the plantar aspect of the right foot extending between the toes and now extensively on the base of the right second toe. I gave him clindamycin and topical Bactroban for MRSA anterior benefiting for the possibility of underlying tinea pedis. Not looking better today The area on the left first/second toe looks better. Debrided of necrotic debris 12/05/17* the patient was worked in urgently today because over the weekend he found blood on his incontinence bad when he woke up. He was found to have an ulcer by his wife who does most of his wound care. He came in today for [place] to look at this. He has not had [name] history of wounds in his buttocks in spite of his paraplegia. 12/08/17; seen in follow-up today at his usual appointment. He was seen earlier this week and found to have [name] new wound on his buttock. We also follow him for wounds on the left lateral leg, left first second toe web space and right first second toe web space 12/15/17; we have been using Hydrofera Blue to the left lateral leg which has improved. The right first second toe web space has also improved. Left first second toe web space plantar aspect looks stable. The left buttock has worsened using Santyl. Apparently the buttock has drainage 12/22/17; we have been using Hydrofera Blue to the left lateral leg which continues to improve now 2 small wounds separated by normal skin. He tells [place] he had [name] fever up to 100 yesterday he is prone to UTIs but has not noted anything different. He does in and out catheterizations. The area between the first and second toes today does not look good necrotic surface covered with what looks to be purulent drainage and erythema extending into the third toe. I had gotten this to something that I thought look better last time however it is not look good today. He also has [name] necrotic surface over the buttock wound which is  expanded. I thought there might be infection under here so I removed [name] lot of the surface with [name] #5 curet though nothing look like it really needed culturing. He is been using Santyl to this area 12/27/17; his original wound on the left lateral leg continues to improve using Hydrofera Blue. I gave him samples of Baxdella although he was unable to take them out of fear for an allergic reaction ["lump in his throat"].the culture I did of the purulent drainage from his second toe last week showed both enterococcus and [name] set Enterobacter I was also concerned about the erythema on the bottom of his foot although paradoxically although this looks somewhat better today. 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 We've been using silver alginate here. They came up today that he sleeps in [name] recliner,  I'm not sure why but I asked him to stop this 01/03/18; his original wound we've been using Hydrofera Blue is now separated into 2 areas. Ulcer on his left buttock is better he is off the recliner and sleeping in bed Finally both wound areas between his first and second toes also looks some better 01/10/18; his original wound on the left lateral leg is now separated into 2 wounds we've been using Hydrofera Blue Ulcer on his left buttock has some drainage. There is [name] small probing site going into muscle layer superiorly.using silver alginate -He arrives today with [name] deep tissue injury on the left heel The wound on the dorsal aspect of his first second toe on the left looks [name] lot betterusing silver alginate ketoconazole The area on the first second toe web space on the right also looks [name] lot bette 01/17/18; his original wound on the left lateral leg continues to progress using Hydrofera Blue Ulcer on his left buttock also is smaller surface healthier except for [name] small probing site going into the muscle layer superiorly. 2.4 cm  of tunneling in this area DTI on his left heel we have only been offloading. Looks better than last week no threatened open no evidence of infection the wound on the dorsal aspect of the first second toe on the left continues to look like it's regressing we have only been using silver alginate and terbinafine orally The area in the first second toe web space on the right also looks to be [name] lot better using silver alginate and terbinafine I think this was prompted by tinea pedis 01/31/18; the patient was hospitalized in [place] last week apparently for [name] complicated UTI. He was discharged on cefepime he does in and out catheterizations. In the hospital he was discovered M I don't mild elevation of AST and ALT and the terbinafine was stopped.predictably the pressure ulcer on s his buttock looks betterusing silver alginate. The area on the left lateral leg also is better using Hydrofera Blue. The area between the first and second toes on the left better. First and second toes on the right still substantial but better. Finally the DTI on the left heel has held together and looks like it's resolving 02/07/18-he is here in follow-up evaluation for multiple ulcerations. He has new injury to the lateral aspect of the last issue [name] pressure ulcer, he states this is from adhesive removal trauma. He states he has tried multiple adhesive products with no success. All other ulcers appear stable. The left heel DTI is resolving. We will continue with same treatment plan and follow-up next week. 02/14/18; follow-up for multiple areas. He has [name] new area last week on the lateral aspect of his pressure ulcer more over the posterior trochanter. The original pressure ulcer looks quite stable has healthy granulation. 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 Almost fully epithelialized on the original superior  area using Hydrofera Blue DTI on the left heel has peeled off this week to reveal [name] small superficial wound under denuded skin and subcutaneous tissue Both areas between the first and second toes look better including nothing open on the left 02/21/18; The patient's wounds on his left ischial tuberosity and posterior left greater trochanter actually looked better. He has [name] large area of irritation around the area which I think is contact dermatitis. I am doubtful that this is fungal His original wound on  the left lateral calf continues to improve we have been using Hydrofera Blue There is no open area in the left first second toe web space although there is [name] lot of thick callus The DTI on the left heel required debridement today of necrotic surface eschar and subcutaneous tissue using silver alginate Finally the area on the right first second toe webspace continues to contract using silver alginate and ketoconazole 02/28/18 Left ischial tuberosity wounds look better using silver alginate. Original wound on the left calf only has one small open area left using Hydrofera Blue DTI on the left heel required debridement mostly removing skin from around this wound surface. Using silver alginate The areas on the right first/second toe web space using silver alginate and ketoconazole 03/08/18 on evaluation today patient appears to be doing decently well as best I can tell in regard to his wounds. This is the first time that I have seen him as he generally is followed by Dr. Dellia Nims. With that being said none of his wounds appear to be infected he does have an area where there is some skin covering what appears to be [name] new wound on the left dorsal surface of his great toe. This is right at the nail bed. With that being said I do believe that debrided away some of the excess skin can be of benefit in this regard. Otherwise he has been tolerating the dressing changes without complication. 03/14/18; patient  arrives today with the multiplicity of wounds that we are following. He has not been systemically unwell Original wound on the left lateral calf now only has 2 small open areas we've been using Hydrofera Blue which should continue The deep tissue injury on the left heel requires debridement today. We've been using silver alginate The left first second toe and the right first second toe are both are reminiscence what I think was tinea pedis. Apparently some of the callus Surface between the toes was removed last week when it started draining. Purulent drainage coming from the wound on the ischial tuberosity on the left. 03/21/18-He is here in follow-up evaluation for multiple wounds. 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 He tolerated debridement. The only change to last week's recommendations is to discontinue antifungal cream between toes. He will follow-up next week 03/28/18; following up for multiple wounds;Concern this week is streaking redness and swelling in the right foot. He is going to need antibiotics for this. 03/31/18; follow-up for right foot cellulitis. Streaking redness and swelling in the right foot on 03/28/18. 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 I put him on clindamycin 300 mg every 6 and brought him in for [name] quick check. He has an open wound between his first and second toes on the right foot as [name] potential source. 04/04/18; Right foot cellulitis is resolving he is completing clindamycin. This is truly good news Left lateral calf wound which is initial wound only has one small open area inferiorly this is close to healing out. He has compression stockings. We will use Hydrofera Blue right down to the epithelialization of this Nonviable surface on the left heel which was initially pressure with [name] DTI. We've been using Hydrofera Blue. I'm going to switch this back to silver  alginate Left first second toe/tinea pedis this looks better using silver alginate Right first second toe tinea pedis using silver alginate Large pressure ulcers on theLeft ischial tuberosity. Small wound here Looks  better. I am uncertain about the surface over the large wound. Using silver alginate 04/11/18; Cellulitis in the right foot is resolved Left lateral calf wound which was his original wounds still has 2 tiny open areas remaining this is just about closed Nonviable surface on the left heel is better but still requires debridement Left first second toe/tinea pedis still open using silver alginate Right first second toe wound tinea pedis I asked him to go back to using ketoconazole and silver alginate Large pressure ulcers on the left ischial tuberosity this shear injury here is resolved. Wound is smaller. No evidence of infection using silver alginate 04/18/18; Patient arrives with an intense area of cellulitis in the right mid lower calf extending into the right heel area. Bright red and warm. Smaller area on the left anterior leg. He has [name] significant history of MRSA. He will definitely need antibioticsdoxycycline He now has 2 open areas on the left ischial tuberosity the original large wound and now [name] satellite area which I think was above his initial satellite areas. Not [name] wonderful surface on this satellite area surrounding erythema which looks like pressure related. His left lateral calf wound again his original wound is just about closed Left heel pressure injury still requiring debridement Left first second toe looks [name] lot better using silver alginate Right first second toe also using silver alginate and ketoconazole cream also looks better 04/20/18; the patient was worked in early today out of concerns with his cellulitis on the right leg. I had started him on doxycycline. This was 2 days ago. His wife was concerned about the swelling in the area. Also concerned about the left  buttock. He has not been systemically unwell no fever chills. No nausea vomiting or diarrhea 04/25/18; the patient's left buttock wound is continued to deteriorate he is using Hydrofera Blue. He is still completing clindamycin for the cellulitis on the right leg although all of this looks better. 05/02/18 Left buttock wound still with [name] lot of drainage and [name] very tightly adherent fibrinous necrotic surface. He has [name] deeper area superiorly The left lateral calf wound is still closed DTI wound on the left heel necrotic surface especially the circumference using Iodoflex Areas between his left first second toe and right first second toe both look better. Dorsally and the right first second toe he had [name] necrotic surface although at smaller. In using silver alginate and ketoconazole. I did [name] culture last week which was [name] deep tissue culture of the reminiscence of the open wound on the right first second toe dorsally. This grew [name] few Acinetobacter and [name] few methicillin-resistant staph aureus. Nevertheless the area actually this week looked better. I didn't feel the need to specifically address this at least in terms of systemic antibiotics. 05/09/18; wounds are measuring larger more drainage per our intake. We are using Santyl covered with alginate on the large superficial buttock wounds, Iodosorb on the left heel, ketoconazole and silver alginate to the dorsal first and second toes bilaterally. 05/16/18; The area on his left buttock better in some aspects although the area superiorly over the ischial tuberosity required an extensive debridement.using Santyl Left heel appears stable. Using Iodoflex The areas between his first and second toes are not bad however there is spreading erythema up the dorsal aspect of his left foot this looks like cellulitis again. He is insensate the erythema is really very brilliant.o Erysipelas He went to see an allergist days ago because he was itching part of this  he had lab  work done. This showed [name] white count of 15.1 with 70% neutrophils. Hemoglobin of 11.4 and [name] platelet count of 659,000. Last white count we had in Epic was [name] 2-1/2 years ago which was 25.9 but he was ill at the time. He was able to show me some lab work that was done by his primary physician the pattern is about the same. 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 But prompted me to go ahead and do x-rays of both feet and the pelvis rule out osteomyelitis. He also had [name] comprehensive metabolic panel this was reasonably normal his albumin was 3.7 liver function tests BUN/creatinine all normal 05/23/18; x-rays of both his feet from last week were negative for underlying pulmonary abnormality. The x-ray of his pelvis however showed mild irregularity in the left ischial which may represent some early osteomyelitis. The wound in the left ischial continues to get deeper clearly now exposed muscle. Each week necrotic surface material over this area. Whereas the rest of the wounds do not look so bad. The left ischial wound we have been using Santyl and calcium alginate T the left heel surface necrotic debris using Iodoflex o The left lateral leg is still healed Areas on the left dorsal foot and the right dorsal foot are about the same. There is some inflammation on the left which might represent contact dermatitis, fungal dermatitis I am doubtful cellulitis although this looks better than last week 05/30/18; CT scan done at Hospital did not show any osteomyelitis or abscess. Suggested the possibility of underlying cellulitis although I don't see [name] lot of evidence of this at the bedside The wound itself on the left buttock/upper thigh actually looks somewhat better. No debridement Left heel also looks better no debridement continue Iodoflex Both dorsal first second toe spaces appear better using Lotrisone. Left still required debridement 06/06/18; Intake reported  some purulent looking drainage from the left gluteal wound. Using Santyl and calcium alginate Left heel looks better although still [name] nonviable surface requiring debridement The left dorsal foot first/second webspace actually expanding and somewhat deeper. I may consider doing [name] shave biopsy of this area Right dorsal foot first/second webspace appears stable to improved. Using Lotrisone and silver alginate to both these areas 06/13/18 Left gluteal surface looks better. Now separated in the 2 wounds. No debridement required. Still drainage. We'll continue silver alginate Left heel continues to look better with Iodoflex continue this for at least another week Of his dorsal foot wounds the area on the left still has some depth although it looks better than last week. We've been using Lotrisone and silver alginate 06/20/18 Left gluteal continues to look better healthy tissue Left heel continues to look better healthy granulation wound is smaller. He is using Iodoflex and his long as this continues continue the Iodoflex Dorsal right foot looks better unfortunately dorsal left foot does not. There is swelling and erythema of his forefoot. He had minor trauma to this several days ago but doesn't think this was enough to have caused any tissue injury. Foot looks like cellulitis, we have had this problem before 06/27/18 on evaluation today patient appears to be doing [name] little worse in regard to his foot ulcer. 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 Both of which would really be his  only options for treating this infection. In the past he has been given and effusion of Orbactiv. This is done very well for him in the past again it's one time dosing IV antibiotic therapy. Subsequently I do believe this is something we're gonna need to see about doing at this point in time.  Currently his other wounds seem to be doing somewhat better in my pinion I'm pretty happy in that regard. 07/03/18 on evaluation today patient's wounds actually appear to be doing fairly well. He has been tolerating the dressing changes without complication. All in all he seems to be showing signs of improvement. In regard to the antibiotics he has been dealing with infectious disease since I saw him last week as far as getting this scheduled. In the end [facility]'s [facility] this coming Friday. In the meantime he has been continuing to perform the dressing changes in such as previous. There does not appear to be any evidence of infection worsengin at this time. 07/10/18; Since I last saw this man 2 weeks ago things have actually improved. IV antibiotics of resulted in less forefoot erythema although there is still some present. He is not systemically unwell Left buttock wounds 2 now have no depth there is increased epithelialization Using silver alginate Left heel still requires debridement using Iodoflex Left dorsal foot still with [name] sizable wound about the size of [name] border but healthy granulation Right dorsal foot still with [name] slitlike area using silver alginate 07/18/18; the patient's cellulitis in the left foot is improved in fact I think it is on its way to resolving. Left buttock wounds 2 both look better although the larger one has hypertension granulation we've been using silver alginate Left heel has some thick circumferential redundant skin over the wound edge which will need to be removed today we've been using Iodoflex Left dorsal foot is still [name] sizable wound required debridement using silver alginate The right dorsal foot is just about closed only [name] small open area remains here 07/25/18; left foot cellulitis is resolved Left buttock wounds 2 both look better. Hyper-granulation on the major area Left heel as some debris over the  surface but otherwise looks [name] healthier wound. Using silver collagen Right dorsal foot is just about closed 07/31/18; arrives with our intake nurse worried about purulent drainage from the buttock. We had hyper-granulation here last week His buttock wounds 2 continue to look better Left heel some debris over the surface but measuring smaller. Right dorsal foot unfortunately has openings between the toes Left foot superficial wound looks less aggravated. 08/07/18 Buttock wounds continue to look better although some of her granulation and the larger medial wound. silver alginate Left heel continues to look [name] lot better.silver collagen Left foot superficial wound looks less stable. Requires debridement. He has [name] new wound superficial area on the foot on the lateral dorsal foot. Right foot looks better using silver alginate without Lotrisone 08/14/2018; patient was in the ER last week diagnosed with [name] UTI. He is now on Cefpodoxime and Macrodantin. Buttock wounds continued to be smaller. Using silver alginate Left heel continues to look better using silver collagen Left foot superficial wound looks as though it is improving Right dorsal foot area is just about healed. 08/21/2018; patient is completed his antibiotics for his UTI. He has 2 open areas on the buttocks. There is still not closed although the surface looks satisfactory. Using silver alginate Left heel  continues to improve using silver collagen The bilateral dorsal foot areas which are at the base of his first and second toes/possible tinea pedis are actually stable on the left but worse on the right. The area on the left required debridement of necrotic surface. After debridement I obtained [name] specimen for PCR culture. The right dorsal foot which is been just about healed last week is now reopened 08/28/2018; culture done on the left dorsal foot showed coag negative staph both staph epidermidis and Lugdunensis. I think this is worthwhile  initiating systemic treatment. I will use doxycycline given his long list of allergies. The area on the left heel slightly improved but still requiring debridement. The large wound on the buttock is just about closed whereas the smaller one is larger. Using silver alginate in this area 09/04/2018; patient is completing his doxycycline for the left foot although this continues to be [name] very difficult wound area with very adherent necrotic debris. We are using silver alginate to all his wounds right foot left foot and the small wounds on his buttock, silver collagen on the left heel. 09/11/2018; once again this patient has intense erythema and swelling of the left forefoot. Lesser degrees of erythema in the right foot. He has [name] long list of allergies and intolerances. I will reinstitute doxycycline. 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 Using silver alginate Left heel also looks better using silver collagen Unfortunately both the areas on his feet look worse. The area on the left first second webspace is now gone through to the plantar part of his foot. The area on the left foot anteriorly is irritated with erythema and swelling in the forefoot. 09/25/2018 His wound on the left plantar heel looks better. Using silver collagen The area on the left buttock 2 small remnant areas. One is closed one is still open. Using silver alginate The areas between both his first and second toes look worse. This in spite of long-standing antifungal therapy with ketoconazole and silver alginate which should have antifungal activity He has small areas around his original wound on the left calf one is on the bottom of the original scar tissue and one superiorly both of these are small and superficial but again given wound history in this site this is worrisome 10/02/2018 Left plantar heel continues to gradually contract using silver collagen Left buttock wound is  unchanged using silver alginate The areas on his dorsal feet between his first and second toes bilaterally look about the same. I prescribed clindamycin ointment to see if we can address chronic staph colonization and also the underlying possibility of erythrasma The left lateral lower extremity wound is actually on the lateral part of his ankle. Small open area here. We have been using silver alginate 10/09/2018; Left plantar heel continues to look healthy and contract. No debridement is required Left buttock slightly smaller with [name] tape injury wound just below which was new this week Dorsal feet somewhat improved I have been using clindamycin Left lateral looks lower extremity the actual open area looks worse although [name] lot of this is epithelialized. I am going to change to silver collagen today He has [name] lot more swelling in the right leg although this is not pitting not red and not particularly warm there is [name] lot of spasm in the right leg usually indicative of people with paralysis of some underlying discomfort. We have reviewed his vascular status from 2017 he  had [name] left greater saphenous vein ablation. I wonder about referring him back to vascular surgery if the area on the left leg continues to deteriorate. 10/16/2018 in today for follow-up and management of multiple lower extremity ulcers. His left Buttock wound is much lower smaller and almost closed completely. The wound to the left ankle has began to reopen with Epithelialization and some adherent slough. He has multiple new areas to the left foot and leg. The left dorsal foot without much improvement. Wound present between left great webspace and 2nd toe. Erythema and edema present right leg. Right LE ultrasound obtained on 10/10/18 was negative for DVT . 10/23/2018; Left buttock is closed over. Still dry macerated skin but there is no open wound. I suspect this is chronic pressure/moisture Left lateral calf is quite [name] bit worse than  when I saw this last. There is clearly drainage here he has macerated skin into the left plantar heel. We will change the primary dressing to alginate Left dorsal foot has some improvement in overall wound area. Still using clindamycin and silver alginate Right dorsal foot about the same as the left using clindamycin and silver alginate The erythema in the right leg has resolved. He is DVT rule out was negative Left heel pressure area required debridement although the wound is smaller and the surface is health 10/26/2018 The patient came back in for his nurse check today predominantly because of the drainage coming out of the left lateral leg with [name] recent reopening of his original wound on the left lateral calf. He comes in today with [name] large amount of surrounding erythema around the wound extending from the calf into the ankle and even in the area on the dorsal foot. He is not systemically unwell. He is not febrile. Nevertheless this looks like cellulitis. We have been using silver alginate to the area. I changed him to [name] regular visit and I am going to prescribe him doxycycline. The rationale here is [name] long list of medication intolerances and [name] history of MRSA. I did not see anything that I thought would provide [name] valuable culture 10/30/2018 Follow-up from his appointment 4 days ago with really an extensive area of cellulitis in the left calf left lateral ankle and left dorsal foot. I put him on doxycycline. He has [name] long list of medication allergies which are true allergy reactions. 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 In any case he is [name] lot better today. The erythema especially in the anterior and lateral left calf is better. He still has left ankle erythema. He also is complaining about increasing edema in the right leg we have only been using Kerlix Coban and he has been doing the wraps at home. Finally he has [name] spotty  rash on the medial part of his upper left calf which looks like folliculitis or perhaps wrap occlusion type injury. Small superficial macules not pustules 11/06/18 patient arrives today with again [name] considerable degree of erythema around the wound on the left lateral calf extending into the dorsal ankle and dorsal foot. This is [name] lot worse than when I saw this last week. He is on doxycycline really with not [name] lot of improvement. He has not been systemically unwell Wounds on the; left heel actually looks improved. Original area on the left foot and proximity to the first and second toes looks about the same. He has superficial areas on the dorsal foot, anterior  calf and then the reopening of his original wound on the left lateral calf which looks about the same The only area he has on the right is the dorsal webspace first and second which is smaller. He has [name] large area of dry erythematous skin on the left buttock small open area here. 11/13/2018; the patient arrives in much better condition. The erythema around the wound on the left lateral calf is [name] lot better. Not sure whether this was the clindamycin or the TCA and ketoconazole or just in the improvement in edema control [stasis dermatitis]. In any case this is [name] lot better. The area on the left heel is very small and just about resolved using silver collagen we have been using silver alginate to the areas on his dorsal feet 11/20/2018; his wounds include the left lateral calf, left heel, dorsal aspects of both feet just proximal to the first second webspace. He is stable to slightly improved. I did not think any changes to his dressings were going to be necessary 11/27/2018 he has [name] reopening on the left buttock which is surrounded by what looks like tinea or perhaps some other form of dermatitis. The area on the left dorsal foot has some erythema around it I have marked this area but I am not sure whether this is cellulitis or not. Left heel is not  closed. Left calf the reopening is really slightly longer and probably worse 1/13; in general things look better and smaller except for the left dorsal foot. Area on the left heel is just about closed, left buttock looks better only [name] small wound remains in the skin looks better [using Lotrisone] 1/20; the area on the left heel only has [name] few remaining open areas here. Left lateral calf about the same in terms of size, left dorsal foot slightly larger right lateral foot still not closed. The area on the left buttock has no open wound and the surrounding skin looks [name] lot better 1/27; the area on the left heel is closed. Left lateral calf better but still requiring extensive debridements. The area on his left buttock is closed. He still has the open areas on the left dorsal foot which is slightly smaller in the right foot which is slightly expanded. We have been using Iodoflex on these areas as well 2/3; left heel is closed. Left lateral calf still requiring debridement using Iodoflex there is no open area on his left buttock however he has dry scaly skin over [name] large area of this. Not really responding well to the Lotrisone. Finally the areas on his dorsal feet at the level of the first second webspace are slightly smaller on the right and about the same on the left. Both of these vigorously debrided with Anasept and gauze 2/10; left heel remains closed he has dry erythematous skin over the left buttock but there is no open wound here. Left lateral leg has come in and with. Still requiring debridement we have been using Iodoflex here. Finally the area on the left dorsal foot and right dorsal foot are really about the same extremely dry callused fissured areas. He does not yet have [name] dermatology appointment 2/17; left heel remains closed. He has [name] new open area on the left buttock. The area on the left lateral calf is bigger longer and still covered in necrotic debris. No major change in his foot areas  bilaterally. I am awaiting for [name] dermatologist to look on this. We have been using ketoconazole I  do not know that this is been doing any good at all. 2/24; left heel remains closed. The left buttock wound that was new reopening last week looks better. The left lateral calf appears better also although still requires debridement. The major area on his foot is the left first second also requiring debridement. We have been putting Prisma on all wounds. I do not believe that the ketoconazole has done too much good for his feet. He will use Lotrisone I am going to give him [name] 2-week course of terbinafine. We still do not have [name] dermatology appointment 3/2 left heel remains closed however there is skin over bone in this area I pointed this out to him today. The left buttock wound is epithelialized but still does not look completely stable. The area on the left leg required debridement were using silver collagen here. With regards to his feet we changed to Lotrisone last week and silver alginate. 3/9; left heel remains closed. Left buttock remains closed. The area on the right foot is essentially closed. The left foot remains unchanged. Slightly smaller on the left lateral calf. Using silver collagen to both of these areas 3/16-Left heel remains closed. Area on right foot is closed. Left lateral calf above the lateral malleolus open wound requiring debridement with easy bleeding. Left dorsal wound proximal to first toe also debrided. Left ischial area open new. Patient has been using Prisma with wrapping every 3 days. Dermatology appointment is apparently tomorrow.Patient has completed his terbinafine 2-week course with some apparent improvement according to him, there is still flaking and dry skin in his foot on the left 3/23; area on the right foot is reopened. The area on the left anterior foot is about the same still [name] very necrotic adherent surface. He still has the area on the left leg and reopening  is on the left buttock. He apparently saw dermatology although I do not have [name] note. According to the patient who is usually fairly well informed they did not have any good ideas. Put him on oral terbinafine which she is been on before. 3/30; using silver collagen to all wounds. Apparently his dermatologist put him on doxycycline and rifampin presumably some culture grew staph. I do not have this result. He remains on terbinafine although I have used terbinafine on him before 4/6; patient has had [name] fairly substantial reopening on the right foot between the first and second toes. He is finished his terbinafine and I believe is on doxycycline and rifampin still as prescribed by dermatology. We have been using silver collagen to all his wounds although the patient reports that he thinks silver alginate does better on the wounds on his buttock. 4/13; the area on his left lateral calf about the same size but it did not require debridement. Left dorsal foot just proximal to the webspace between the first and second toes is about the same. Still nonviable surface. I note some superficial bronze discoloration of the dorsal part of his foot Right dorsal foot just proximal to the first and second toes also looks about the same. I still think there may be the same discoloration I noted above on the left Left buttock wound looks about the same 4/20; left lateral calf appears to be gradually contracting using silver collagen. 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 The left dorsal foot wound is debrided of tightly adherent necrotic debris and really cleans up quite nicely. The right area is worse with  expansion. I did not debride this it is now over the base of the second toe The area on his left buttock is smaller no debridement is required using silver collagen 5/4; left calf continues to make good progress. He arrives with erythema around the wounds  on his dorsal foot which even extends to the plantar aspect. Very concerning for coexistent infection. He is finished the erythromycin I gave him for possible erythrasma this does not seem to have helped. The area on the left foot is about the same base of the dorsal toes Is area on the buttock looks improved on the left 5/11; left calf and left buttock continued to make good progress. Left foot is about the same to slightly improved. Major problem is on the right foot. He has not had an x-ray. Deep tissue culture I did last week showed both Enterobacter and E. coli. I did not change the doxycycline I put him on empirically although neither 1 of these were plated to doxycycline. He arrives today with the erythema looking worse on both the dorsal and plantar foot. Macerated skin on the bottom of the foot. he has not been systemically unwell 5/18-Patient returns at 1 week, left calf wound appears to be making some progress, left buttock wound appears slightly worse than last time, left foot wound looks slightly better, right foot redness is marginally better. X-ray of both feet show no air or evidence of osteomyelitis. Patient is finished his Omnicef and terbinafine. He continues to have macerated skin on the bottom of the left foot as well as right 5/26; left calf wound is better, left buttock wound appears to have multiple small superficial open areas with surrounding macerated skin. X-rays that I did last time showed no evidence of osteomyelitis in either foot. He is finished cefdinir and doxycycline. I do not think that he was on terbinafine. He continues to have [name] large superficial open area on the right foot anterior dorsal and slightly between the first and second toes. I did send him to dermatology 2 months ago or so wondering about whether they would do [name] fungal scraping. I do not believe they did but did do [name] culture. We have been using silver alginate to the toe areas, he has been using  antifungals at home topically either ketoconazole or Lotrisone. We are using silver collagen on the left foot, silver alginate on the right, silver collagen on the left lateral leg and silver alginate on the left buttock 6/1; left buttock area is healed. We have the left dorsal foot, left lateral leg and right dorsal foot. We are using silver alginate to the areas on both feet and silver collagen to the area on his left lateral calf 6/8; the left buttock apparently reopened late last week. He is not really sure how this happened. He is tolerating the terbinafine. Using silver alginate to all wounds 6/15; left buttock wound is larger than last week but still superficial. Came in the clinic today with [name] report of purulence from the left lateral leg I did not identify any infection Both areas on his dorsal feet appear to be better. He is tolerating the terbinafine. Using silver alginate to all wounds 6/22; left buttock is about the same this week, left calf quite [name] bit better. His left foot is about the same however he comes in with erythema and warmth in the right forefoot once again. Culture that I gave him in the beginning of May showed Enterobacter and E. coli.  I gave him doxycycline and things seem to improve although neither 1 of these organisms was specifically plated. 6/29; left buttock is larger and dry this week. Left lateral calf looks to me to be improved. Left dorsal foot also somewhat improved right foot completely unchanged. The erythema on the right foot is still present. He is completing the Ceftin dinner that I gave him empirically [see discussion above.) 7/6 - All wounds look to be stable and perhaps improved, the left buttock wound is slightly smaller, per patient bleeds easily, completed ceftin, the right foot redness is less, he is on terbinafine 7/13; left buttock wound about the same perhaps slightly narrower. Area on the left lateral leg continues to narrow. Left dorsal foot  slightly smaller right foot about the same. We are using silver alginate on the right foot and Hydrofera Blue to the areas on the left. Unna boot on the left 2 layer compression on the right 7/20; left buttock wound absolutely the same. Area on lateral leg continues to get better. Left dorsal foot require debridement as did the right no major change in the 7/27; left buttock wound the same size necrotic debris over the surface. The area on the lateral leg is closed once again. His left foot looks better right foot about the same although there is some involvement now of the posterior first second toe area. He is still on terbinafine which I have given him for [name] month, not certain [name] centimeter major change 06/25/19-All wounds appear to be slightly improved according to report, left buttock wound looks clean, both foot wounds have minimal to no debris the right dorsal foot has minimal slough. We are using Hydrofera Blue to the left and silver alginate to the right foot and ischial wound. 8/10-Wounds all appear to be around the same, the right forefoot distal part has some redness which was not there before, however the wound looks clean and small. Ischial wound looks about the same with no changes 8/17; his wound on the left lateral calf which was his original chronic venous insufficiency wound remains closed. Since I last saw him the areas on the left dorsal foot right dorsal foot generally appear better but require debridement. The area on his left initial tuberosity appears somewhat larger to me perhaps hyper granulated and bleeds very easily. We have been using Hydrofera Blue to the left dorsal foot and silver alginate to everything else 8/24; left lateral calf remains closed. The areas on his dorsal feet on the webspace of the first and second toes bilaterally both look better. The area on the left buttock which is the pressure ulcer stage II slightly smaller. I change the dressing to Hydrofera Blue  to all areas 8/31; left lateral calf remains closed. The area on his dorsal feet bilaterally look better. Using Hydrofera Blue. Still requiring debridement on the left foot. No change in the left buttock pressure ulcers however 9/14; left lateral calf remains closed. Dorsal feet look quite [name] bit better than 2 weeks ago. Flaking dry skin also [name] lot better with the ammonium lactate I gave him 2 weeks ago. The area on the left buttock is improved. He states that his Roho cushion developed [name] leak and he is getting [name] new one, in the interim he is offloading this vigorously 9/21; left calf remains closed. Left heel which was [name] possible DTI looks better this week. He had macerated tissue around the left dorsal foot right foot looks satisfactory and improved left buttock wound.  I changed his dressings to his feet to silver alginate bilaterally. Continuing Hydrofera Blue on the left buttock. 9/28 left calf remains closed. Left heel did not develop anything [possible DTI] dry flaking skin on the left dorsal foot. Right foot looks satisfactory. Improved left buttock wound. We are using silver alginate on his feet Hydrofera Blue on the buttock. I have asked him to go back to the Lotrisone on his feet including the wounds and surrounding areas 10/5; left calf remains closed. The areas on the left and right feet about the same. [name] lot of this is epithelialized however debris over the remaining open areas. He is using Lotrisone and silver alginate. The area on the left buttock using Hydrofera Blue 10/26. Patient has been out for 3 weeks secondary to Covid concerns. He tested negative but I think his wife tested positive. He comes in today with the left foot substantially worse, right foot about the same. Even more concerning he states that the area on his left buttock closed over but then reopened and is considerably deeper in one aspect than it was before [stage III wound] 11/2; left foot really about the same as  last week. Quarter sized wound on the dorsal foot just proximal to the first second toes. Surrounding erythema with areas of denuded epithelium. This is not really much different looking. Did not look like cellulitis this time however. Right foot area about the same.. We have been using silver alginate alginate on his toes Left buttock still substantial irritated skin around the wound which I think looks somewhat better. We have been using Hydrofera Blue here. 11/9; left foot larger than last week and [name] very necrotic surface. Right foot I think is about the same perhaps slightly smaller. Debris around the circumference also addressed. Unfortunately on the left buttock there is been [name] decline. Satellite lesions below the major wound distally and now [name] an additional one posteriorly we have been using Hydrofera Blue but I think this is [name] pressure issue 11/16; left foot ulcer dorsally again [name] very adherent necrotic surface. Right foot is about the same. Not much change in the pressure ulcer on his left buttock. 11/30; left foot ulcer dorsally basically the same as when I saw him 2 weeks ago. Very adherent fibrinous debris on the wound surface. Patient reports [name] lot of drainage as well. 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 We have been using Iodoflex, patient changed back to alginate because of the drainage. Area on his right dorsal foot really looks benign with [name] healthier surface certainly [name] lot better than on the left. Left buttock wounds all improved using Hydrofera Blue 12/7; left dorsal foot again no improvement. Tightly adherent debris. PCR culture I did last week only showed likely skin contaminant. I have gone ahead and done [name] punch biopsy of this which is about the last thing in terms of investigations I can think to do. He has known venous insufficiency and venous hypertension and this could be the issue here. The area on the right foot is  about the same left buttock slightly worse according to our intake nurse secondary to [facility] Blue sticking to the wound 12/14; biopsy of the left foot that I did last time showed changes that could be related to wound healing/chronic stasis dermatitis phenomenon no neoplasm. We have been using silver alginate to both feet. I change the one on the left today to Sorbact and silver alginate to his  other 2 wounds 12/28; the patient arrives with the following problems; Major issue is the dorsal left foot which continues to be [name] larger deeper wound area. Still with [name] completely nonviable surface Paradoxically the area mirror image on the right on the right dorsal foot appears to be getting better. He had some loss of dry denuded skin from the lower part of his original wound on the left lateral calf. Some of this area looked [name] little vulnerable and for this reason we put him in wrap that on this side this week The area on his left buttock is larger. He still has the erythematous circular area which I think is [name] combination of pressure, sweat. This does not look like cellulitis or fungal dermatitis 11/26/2019; -Dorsal left foot large open wound with depth. Still debris over the surface. Using Sorbact The area on the dorsal right foot paradoxically has closed over He has [name] reopening on the left ankle laterally at the base of his original wound that extended up into the calf. This appears clean. The left buttock wound is smaller but with very adherent necrotic debris over the surface. We have been using silver alginate here as well The patient had arterial studies done in 2017. He had biphasic waveforms at the dorsalis pedis and posterior tibial bilaterally. ABI in the left was 1.17. Digit waveforms were dampened. He has slight spasticity in the great toes I do not think [name] TBI would be possible 1/11; the patient comes in today with [name] sizable reopening between the first and second toes on the right. This is  not exactly in the same location where we have been treating wounds previously. According to our intake nurse this was actually fairly deep but 0.6 cm. The area on the left dorsal foot looks about the same the surface is somewhat cleaner using Sorbact, his MRI is in 2 days. We have not managed yet to get arterial studies. The new reopening on the left lateral calf looks somewhat better using alginate. The left buttock wound is about the same using alginate 1/18; the patient had his ARTERIAL studies which were quite normal. ABI in the right at 1.13 with triphasic/biphasic waveforms on the left ABI 1.06 again with triphasic/biphasic waveforms. It would not have been possible to have done [name] toe brachial index because of spasticity. We have been using Sorbac to the left foot alginate to the rest of his wounds on the right foot left lateral calf and left buttock 1/25; arrives in clinic with erythema and swelling of the left forefoot worse over the first MTP area. This extends laterally dorsally and but also posteriorly. Still has an area on the left lateral part of the lower part of his calf wound it is eschared and clearly not closed. Area on the left buttock still with surrounding irritation and erythema. Right foot surface wound dorsally. The area between the right and first and second toes appears better. 2/1; The left foot wound is about the same. Erythema slightly better I gave him [name] week of doxycycline empirically Right foot wound is more extensive extending between the toes to the plantar surface Left lateral calf really no open surface on the inferior part of his original wound however the entire area still looks vulnerable Absolutely no improvement in the left buttock wound required debridement. 2/8; the left foot is about the same. Erythema is slightly improved I gave him clindamycin last week. Right foot looks better he is using Lotrimin and silver alginate He  has [name] breakdown in the left  lateral calf. Denuded epithelium which I have removed Left buttock about the same were using Hydrofera Blue 2/15; left foot is about the same there is less surrounding erythema. Surface still has tightly adherent debris which I have debriding however not making any progress Right foot has [name] substantial wound on the medial right second toe between the first and second webspace. Still an open area on the left lateral calf distal area. Buttock wound is about the same 2/22; left foot is about the same less surrounding erythema. Surface has adherent debris. Polymen Ag Right foot area significant wound between the first and second toes. We have been using silver alginate here Left lateral leg polymen Ag at the base of his original venous insufficiency wound Left buttock some improvement here 3/1; Right foot is deteriorating in the first second toe webspace. Larger and more substantial. We have been using silver alginate. Left dorsal foot about the same markedly adherent surface debris using PolyMem Ag Left lateral calf surface debris using PolyMem AG Left buttock is improved again using PolyMem Ag. He is completing his terbinafine. The erythema in the foot seems better. He has been on this for 2 weeks 3/8; no improvement in any wound area in fact he has [name] small open area on the dorsal midfoot which is new this week. He has not gotten his foot x-rays yet 3/15; his x-rays were both negative for osteomyelitis of both feet. No major change in any of his wounds on the extremities however his buttock wounds are better. We have been using polymen on the buttocks, left lower leg. Iodoflex on the left foot and silver alginate on the right 3/22; arrives in clinic today with the 2 major issues are the improvement in the left dorsal foot wound which for once actually looks healthy with [name] nice healthy wound surface without debridement. Using Iodoflex here. Unfortunately on the left lateral calf which is in the  distal part of his original wound he came to the clinic here for there was purulent drainage noted some increased breakdown scattered around the original area and [name] small area proximally. We we are using polymen here will change to silver alginate today. His buttock wound on the left is better and I think the area on the right first second toe webspace is also improved 3/29; left dorsal foot looks better. Using Iodoflex. Left ankle culture from deterioration last time grew E. coli, Enterobacter and Enterococcus. I will give him [name] course of cefdinir although that will not cover Enterococcus. The area on the right foot in the webspace of the first and second toe lateral first toe looks better. The area on his buttock is about healed Vascular appointment is on April 21. This is to look at his venous system vis--vis continued breakdown of the wounds on the left including the left lateral leg and left dorsal foot he. He has had previous ablations on this side 4/5; the area between the right first and second toes lateral aspect of the first toe looks better. Dorsal aspect of the left first toe on the left foot also improved. Unfortunately the left lateral lower leg is larger and there is [name] second satellite wound superiorly. The usual superficial abrasions on the left buttock overall better but certainly not closed 4/12; the area between the right first and second toes is improved. Dorsal aspect of the left foot also slightly smaller with [name] vibrant healthy looking surface. No real change in  the left lateral leg and the left buttock wound is healed He has an unaffordable co-pay for Apligraf. Appointment with vein and vascular with regards to the left leg venous part of the circulation is on 4/21 4/19; we continue to see improvement in all wound areas. Although this is minor. He has his vascular appointment on 4/21. The area on the left buttock has not reopened although right in the center of this area the  skin looks somewhat threatened 4/26; the left buttock is unfortunately reopened. In general his left dorsal foot has [name] healthy surface and looks somewhat smaller although it was not measured as such. The area between his first and second toe webspace on the right as [name] small wound against the first toe. The patient saw vascular surgery. The real question I was asking was about the small saphenous vein on the left. He has previously ablated left greater saphenous vein. Nothing further was commented on on the left. Right greater saphenous vein without reflux at the saphenofemoral junction or proximal thigh there was no indication for ablation of the right greater saphenous vein duplex was negative for DVT bilaterally. They did not think there was anything from [name] vascular surgery point of view that could be offered. They ABIs within normal limits 5/3; only small open area on the left buttock. The area on the left lateral leg which was his original venous reflux is now 2 wounds both which look clean. We are using Iodoflex on the left dorsal foot which looks healthy and smaller. He is down to [name] very tiny area between the right first and second toes, using silver alginate 5/10; all of his wounds appear better. We have much better edema control in 4 layer compression on the left. This may be the factor that is allowing the left foot and left lateral calf to heal. He has external compression garments at home 04/14/20-All of his wounds are progressing well, the left forefoot is practically closed, left ischium appears to be about the same, right toe webspace is also smaller. The left lateral leg is about the same, continue using Hydrofera Blue to this, silver alginate to the ischium, Iodoflex to the toe space on the right 6/7; most of his wounds outside of the left buttock are doing well. The area on the left lateral calf and left dorsal foot are smaller. The area on the right foot in between the first and  second toe webspace is barely visible although he still says there is some drainage here is the only reason I did not heal this out. Unfortunately the area on the left buttock almost looks like he has [name] skin tear from tape. He has open wound and then [name] large flap of skin that we are trying to get adherence over an area just next to the remaining wound 6/21; 2 week follow-up. I believe is been here for nurse visits. Miraculously the area between his first and second toes on the left dorsal foot is closed over. Still open on the right first second web space. The left lateral calf has 2 open areas. Distally this is more superficial. The proximal area had [name] little more depth and required debridement of adherent necrotic material. His buttock wound is actually larger we have been using silver alginate here 6/28; the patient's area on the left foot remains closed. Still open wet area between the first and second toes on the right and also extending into the plantar aspect. We have been using silver  alginate in this location. He has 2 areas on the left lower leg part of his original long wounds which I think are better. We have been using Hydrofera Blue here. Hydrofera Blue to the left buttock which is stable 7/12; left foot remains closed. Left ankle is closed. May be [name] small area between his right first and second toes the only truly open area is on the left buttock. We have been using Hydrofera Blue here 7/19; patient arrives with marked deterioration especially in the left foot and ankle. We did not put him in [name] compression wrap on the left last week in fact he wore his juxta lite stockings on either side although he does not have an underlying stocking. He has [name] reopening on the left dorsal foot, left lateral ankle and [name] new area on the right dorsal ankle. More worrisome is the degree of erythema on the left foot extending on the lateral foot into the lateral lower leg on the left 7/26; the patient had  erythema and drainage from the lateral left ankle last week. Culture of this grew MRSA resistant to doxycycline and clindamycin which are the 2 antibiotics we usually use with this patient who has multiple antibiotic allergies including linezolid, trimethoprim sulfamethoxazole. I had give him an empiric doxycycline and he comes in the area certainly looks somewhat better although it is blotchy in his lower leg. He has not been systemically unwell. He has had areas on the left dorsal foot which is [name] reopening, chronic wounds on the left lateral ankle. Both of these I think are secondary to chronic venous insufficiency. The area between his first and second toes is closed as far as I can tell. He had [name] new wrap injury on the right dorsal ankle last week. Finally he has an area on the left buttock. We have been using silver alginate to everything except the left buttock we are using Hydrofera Blue 06/30/20-Patient returns at 1 week, has been given [name] sample dose pack of NUZYRA which is [name] tetracycline derivative [omadacycline], patient has completed those, we have been using silver alginate to almost all the wounds except the left ischium where we are using Hydrofera Blue all of them look better 8/16; since I last saw the patient he has been doing well. The area on the left buttock, left lateral ankle and left foot are all closed today. He has completed the [place] I gave him last time and tolerated this well. He still has open areas on the right dorsal ankle and in the right first second toe area which we are using silver alginate. 8/23; we put him in his bilateral external compression stockings last week as he did not have anything open on either leg except for concerning area between the right first and second toe. He comes in today with an area on the left dorsal foot slightly more proximal than the original wound, the left lateral foot but this is actually [name] continuation of the area he had on the left  lateral ankle from last time. As well he is opened up on the left buttock again. 8/30; comes in today with things looking [name] lot better. The area on the left lower ankle has closed down as has the left foot but with eschar in both areas. The area on the dorsal right ankle is also epithelialized. Very little remaining of the left buttock wound. We have been using silver alginate on all wound areas 9/13; the area in the first second  toe webspace on the right has fully epithelialized. He still has some vulnerable epithelium on the right and the ankle and the dorsal foot. He notes weeping. He is using his juxta lite stocking. On the left again the left dorsal foot is closed left lateral ankle is closed. We went to the juxta lite stocking here as well. Still vulnerable in the left buttock although only 2 small open areas remain here 9/27; 2-week follow-up. We did not look at his left leg but the patient says everything is closed. He is [name] bit disturbed by the amount of edema in his left foot he is using juxta lite stockings but asking about over the toes stockings which would be 30/40, will talk to him next time. According to him there is no open wound on either the left foot or the left ankle/calf He has an open area on the dorsal right calf which I initially point [name] wrap injury. He has superficial remaining wound on the left ischial tuberosity been using silver alginate although he says this sticks to the wound 10/5; we gave him 2-week follow-up but he called yesterday expressing some concerns about his right foot right ankle and the left buttock. He came in early. There is still no open areas on the left leg and that still in his juxta lite stocking 10/11; he only has 1 small area on the left buttock that remains measuring millimeters 1 mm. Still has the same irritated skin in this area. We recommended zinc oxide when this eventually closes and pressure relief is meticulously is he can do this. He still  has an area on the dorsal part of his right first through third toes which is [name] bit irritated and still open and on the dorsal ankle near the crease of the ankle. We have been using silver alginate and using his own stocking. He has nothing open on the left leg or foot 10/25; 2-week follow-up. Not nearly as good on the left buttock as I was hoping. For open areas with 5 looking threatened small. He has the erythematous irritated chronic skin in this area. 1 area on the right dorsal ankle. He reports this area bleeds easily Right dorsal foot just proximal to the base of his toes We have been using silver alginate. 11/8; 2-week follow-up. Left buttock is about the same although I do not think the wounds are in the same location we have been using silver alginate. I have asked him to use zinc oxide on the skin around the wounds. He still has [name] small area on the right dorsal ankle he reports this bleeds easily Right dorsal foot just proximal to the base of the toes does not have anything open although the skin is very dry and scaly He has [name] new opening on the nailbed of the left great toe. Nothing on the left ankle 11/29; 3-week follow-up. Left buttock has 2 open areas. And washing of these wounds today started bleeding easily. Suggesting very friable tissue. We have been using silver alginate. Right dorsal ankle which I thought was initially [name] wrap injury we have been using silver alginate. Nothing open between the toes that I can see. He states the area on the left dorsal toe nailbed healed after the last visit in 2 or 3 days 12/13; 3-week follow-up. His left buttock now has 3 open areas but the original 2 areas are smaller using polymen here. Surrounding skin looks better. The right dorsal ankle is closed. He has [name]  small opening on the right dorsal foot at the level of the third toe. In general the skin looks better here. He is wearing his juxta lite stocking on the left leg says there is nothing  open 11/24/2020; 3 weeks follow-up. His left buttock still has the 3 open areas. We have been using polymen but due to lack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 of that his leg actually looks somewhat better. There is still copious drainage and some erythema but it is generally better. The oral options that were obvious including Zyvox and sulfonamides he has rash issues both of these. This is sensitive to rifampin but this is not usually used along gentamicin but this is parenteral and again not used along. The obvious alternative is vancomycin. He has had his arterial studies. [name] T brachial index was 1.3 oe on the right. His waveforms were biphasic bilaterally. Doppler waveforms of the digit were normal in the right damp and on the left. Comment that this could've been due to extreme edema. His venous studies show reflux on both sides in the femoral popliteal veins as well as the greater and lesser saphenous veins bilaterally. Ultimately he is going to need to see vascular surgery about this issue. Hopefully when we can get his wounds and [name] little better shape. 02/19/16; the patient was able to complete [name] course of [facility] for MRSA in the face of  multiple antibiotic allergies. Arterial studies showed an ABI of him 0.88 on the right 1.17 on the left the. Waveforms were biphasic at the posterior tibial and dorsalis pedis digital waveforms were normal. Right toe brachial index was 1.3 limited by shaking and edema. His venous study showed widespread reflux in the left at the common femoral vein the greater and lesser saphenous vein the greater and lesser saphenous vein on the right as well as the popliteal and femoral vein. The popliteal and femoral vein  on the left did not show reflux. His wounds on the right leg give healed on the left he is still using Santyl. 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 The erythema has not really resolved and I wonder if this is mostly venous inflammation rather than cellulitis. Still using Santyl. He is approved for Apligraf 03/04/16; there is less erythema around the wound. Both wounds require aggressive surgical debridement. Not yet ready for Apligraf 03/11/16; aggressive debridement again. Not ready for Apligraf 03/18/16 aggressive debridement again. 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 Has been to see vascular surgery he is being planned for [name] venous ablation 03/25/16; aggressive debridement again of both wound areas on the left lateral leg. He is due for ablation surgery on May 22. He is much closer to being ready for an Apligraf. Has [name] new area between the left first and second toes 04/01/16 aggressive debridement done of both wounds. The new wound at the base of between his second and first toes looks stable 04/08/16; continued aggressive debridement of both wounds on the left lower leg. He goes for his venous ablation on Monday. The new wound at the base of his first and second toes dorsally appears stable. 04/15/16; wounds aggressively debridement although the base of this looks considerably better Apligraf #1. He had ablation surgery on  Monday I'll need to research these records. We only have approval for four Apligraf's 04/22/16; the patient is here for [name] wound check [Apligraf last week] intake nurse concerned about erythema around the wounds. Apparently [name] significant degree of drainage. The patient has chronic venous inflammation which I think accounts for most of this however I was asked to look at this today 04/26/16; the patient came back for check of possible cellulitis in his left foot however the Apligraf dressing was inadvertently removed therefore we elected to prep the wound for [name] second Apligraf. I put him on doxycycline on 6/1 the erythema in the foot 05/03/16 we did not remove the dressing from the superior wound as this is where I put all of his last Apligraf. Surface debridement done with [name] curette of the lower wound which looks very healthy. The area on the left foot also looks quite satisfactory at the dorsal artery at the first and second toes 05/10/16; continue Apligraf to this. Her wound, Hydrafera to the lower wound. He has [name] new area on the right second toe. Left dorsal foot firstoosecond toe also looks improved 05/24/16; wound dimensions must be smaller I was able to use Apligraf to all 3 remaining wound areas. 06/07/16 patient's last Apligraf was 2 weeks ago. He arrives today with the 2 wounds on his lateral left leg joined together. This would have to be seen as [name] negative. He also has [name] small wound in his first and second toe on the left dorsally with quite [name] bit of surrounding erythema in the first second and third toes. This looks to be infected or inflamed, very difficult clinical call. 06/21/16: lateral left leg combined wounds. Adherent surface slough area on the left dorsal foot at roughly the fourth toe looks improved 07/12/16; he now has [name] single linear wound on the lateral left leg. This does not look to be [name] lot changed from when I lost saw this. The area on his dorsal left foot looks considerably better  however. 08/02/16; no major change in the substantial area on his left lateral leg since last time. We have been  using Hydrofera Blue for [name] prolonged period of time now. The area on his left foot is also unchanged from last review 07/19/16; the area on his dorsal foot on the left looks considerably smaller. He is beginning to have significant rims of epithelialization on the lateral left leg wound. This also looks better. 08/05/16; the patient came in for [name] nurse visit today. Apparently the area on his left lateral leg looks better and it was wrapped. However in general discussion the patient noted [name] new area on the dorsal aspect of his right second toe. The exact etiology of this is unclear but likely relates to pressure. 08/09/16 really the area on the left lateral leg did not really look that healthy today perhaps slightly larger and measurements. The area on his dorsal right second toe is improved also the left foot wound looks stable to improved 08/16/16; the area on the last lateral leg did not change any of dimensions. Post debridement with [name] curet the area looked better. Left foot wound improved and the area on the dorsal right second toe is improved 08/23/16; the area on the left lateral leg may be slightly smaller both in terms of length and width. Aggressive debridement with [name] curette afterwards the tissue appears healthier. Left foot wound appears improved in the area on the dorsal right second toe is improved 08/30/16 patient developed [name] fever over the weekend and was seen in an urgent care. Felt to have [name] UTI and put on doxycycline. He has been since changed over the phone to [facility]. After we took off the wrap on his right leg today the leg is swollen warm and erythematous, probably more likely the source of the fever 09/06/16; have been using collagen to the major left leg wound, silver alginate to the area on his anterior foot/toes 09/13/16; the areas on his anterior foot/toes on both  sides appear to be virtually closed. Extensive wound on the left lateral leg perhaps slightly narrower but each visit still covered an adherent surface slough 09/16/16 patient was in for his usual Thursday nurse visit however the intake nurse noted significant erythema of his dorsal right foot. He is also running [name] low- grade fever and having increasing spasms in the right leg 09/20/16 here for cellulitis involving his right great toes and forefoot. This is [name] lot better. Still requiring debridement on his left lateral leg. Santyl direct says he needs prior authorization. Therefore his wife cannot change this at home 09/30/16; the patient's extensive area on the left lateral calf and ankle perhaps somewhat better. Using Santyl. The area on the left toes is healed and I think the area on his right dorsal foot is healed as well. There is no cellulitis or venous inflammation involving the right leg. He is going to need compression stockings here. 10/07/16; the patient's extensive wound on the left lateral calf and ankle does not measure any differently however there appears to be less adherent surface slough using Santyl and aggressive weekly debridements 10/21/16; no major change in the area on the left lateral calf. Still the same measurement still very difficult to debridement adherent slough and nonviable subcutaneous tissue. This is not really been helped by several weeks of Santyl. Previously for 2 weeks I used Iodoflex for [name] short period. [name] prolonged course of Hydrofera Blue didn't really help. I'm not sure why I only used 2 weeks of Iodoflex on this there is no evidence of surrounding infection. He has [name] small area on the right  second toe which looks as though it's progressing towards closure 10/28/16; the wounds on his toes appear to be closed. No major change in the left lateral leg wound although the surface looks somewhat better using Iodoflex. He has had previous arterial studies that were  normal. He has had reflux studies and is status post ablation although I don't have any exact notes on which vein was ablated. I'll need to check the surgical record 11/04/16; he's had [name] reopening between the first and second toe on the left and right. No major change in the left lateral leg wound. There is what appears to be cellulitis of the left dorsal foot 11/18/16 the patient was hospitalized initially in [place] and then subsequently transferred to [facility] long and was admitted there from 11/09/16 through 11/12/16. He had developed progressive cellulitis on the right leg in spite of the doxycycline I gave him. I'd spoken to the hospitalist in [place] who was concerned about continuing leukocytosis. CT scan is what I suggested this was done which showed soft tissue swelling without evidence of osteomyelitis or an underlying abscess blood cultures were negative. At [facility] he was treated with vancomycin and Primaxin and then add an infectious disease consult. He was transitioned to Ceftaroline. He has been making progressive improvement. Overall [name] severe cellulitis of the right leg. He is been using silver alginate to her original wound on the left leg. The wounds in his toes on the right are closed there is [name] small open area on the base of the left second toe 11/26/15; the patient's right leg is much better although there is still some edema here this could be reminiscent from his severe cellulitis likely on top of some degree of lymphedema. His left anterior leg wound has less surface slough as reported by her intake nurse. Small wound at the base of the left second toe 12/02/16; patient's right leg is better and there is no open wound here. His left anterior lateral leg wound continues to have [name] healthy-looking surface. Small wound at the base of the left second toe however there is erythema in the left forefoot which is worrisome 12/16/16; is no open wounds on his right leg. We took measurements  for stockings. His left anterior lateral leg wound continues to have [name] healthy-looking surface. I'm not sure where we were with the Apligraf run through his insurance. We have been using Iodoflex. He has [name] thick eschar on the left first second toe interface, I suspect this may be fungal however there is no visible open 12/23/16; no open wound on his right leg. He has 2 small areas left of the linear wound that was remaining last week. We have been using Prisma, I thought I have disclosed this week, we can only look forward to next week 01/03/17; the patient had concerning areas of erythema last week, already on doxycycline for UTI through his primary doctor. The erythema is absolutely no better there is warmth and swelling both medially from the left lateral leg wound and also the dorsal left foot. 01/06/17- Patient is here for follow-up evaluation of his left lateral leg ulcer and bilateral feet ulcers. He is on oral antibiotic therapy, tolerating that. Nursing staff and the patient states that the erythema is improved from Monday. 01/13/17; the predominant left lateral leg wound continues to be problematic. I had put Apligraf on him earlier this month once. However he subsequently developed what appeared to be an intense cellulitis around the left lateral leg wound.  I gave him Dalvance I think on 2/12 perhaps 2/13 he continues on cefdinir. The erythema is still present but the warmth and swelling is improved. I am hopeful that the cellulitis part of this control. I wouldn't be surprised if there is an element of venous inflammation as well. 01/17/17. The erythema is present but better in the left leg. His left lateral leg wound still does not have [name] viable surface buttons certain parts of this long thin wound it appears like there has been improvement in dimensions. 01/20/17; the erythema still present but much better in the left leg. I'm thinking this is his usual degree of chronic venous inflammation. The  wound on the left leg looks somewhat better. Is less surface slough 01/27/17; erythema is back to the chronic venous inflammation. The wound on the left leg is somewhat better. I am back to the point where I like to try an Apligraf once again 02/10/17; slight improvement in wound dimensions. Apligraf #2. He is completing his doxycycline 02/14/17; patient arrives today having completed doxycycline last Thursday. This was supposed to be [name] nurse visit however once again he hasn't tense erythema from the medial part of his wound extending over the lower leg. Also erythema in his foot this is roughly in the same distribution as last time. He has baseline chronic venous inflammation however this is [name] lot worse than the baseline I have learned to accept the on him is baseline inflammation 02/24/17- patient is here for follow-up evaluation. He is tolerating compression therapy. His voicing no complaints or concerns he is here anticipating an Apligraf 03/03/17; he arrives today with an adherent necrotic surface. I don't think this is surface is going to be amenable for Apligraf's. The erythema around his wound and on the left dorsal foot has resolved he is off antibiotics 03/10/17; better-looking surface today. I don't think he can tolerate Apligraf's. He tells me he had [name] wound VAC after [name] skin graft years ago to this area and they had difficulty with [name] seal. The erythema continues to be stable around this some degree of chronic venous inflammation but he also has recurrent cellulitis. We have been using Iodoflex 03/17/17; continued improvement in the surface and may be small changes in dimensions. Using Iodoflex which seems the only thing that will control his surface 03/24/17- He is here for follow up evaluation of his LLE lateral ulceration and ulcer to right dorsal foot/toe space. He is voicing no complaints or concerns, He is tolerating compression wrap. 03/31/17 arrives today with [name] much healthier looking wound  on the left lower extremity. We have been using Iodoflex for [name] prolonged period of time which has for the first time prepared and adequate looking wound bed although we have not had much in the way of wound dimension improvement. He also has [name] small wound between the first and second toe on the right 04/07/17; arrives today with [name] healthy-looking wound bed and at least the top 50% of this wound appears to be now her. No debridement was required I have changed him to [facility] last week after prolonged Iodoflex. He did not do well with Apligraf's. We've had [name] re-opening between the first and second toe on the right 04/14/17; arrives today with [name] healthier looking wound bed contractions and the top 50% of this wound and some on the lesser 50%. Wound bed appears healthy. The area between the first and second toe on the right still remains problematic 04/21/17; continued very gradual improvement.  Using [facility] 04/28/17; continued very gradual improvement in the left lateral leg venous insufficiency wound. His periwound erythema is very mild. We have been using Hydrofera Blue. Wound is making progress especially in the superior 50% 05/05/17; he continues to have very gradual improvement in the left lateral venous insufficiency wound. Both in terms with an length rings are improving. I debrided this every 2 weeks with #5 curet and we have been using Hydrofera Blue and again making good progress With regards to the wounds between his right first and second toe which I thought might of been tinea pedis he is not making as much progress very dry scaly skin over the area. Also the area at the base of the left first and second toe in [name] similar condition 05/12/17; continued gradual improvement in the refractory left lateral venous insufficiency wound on the left. Dimension smaller. Surface still requiring debridement using Hydrofera Blue 05/19/17; continued gradual improvement in the refractory left lateral  venous ulceration. Careful inspection of the wound bed underlying rumination suggested some degree of epithelialization over the surface no debridement indicated. Continue Hydrofera Blue difficult areas between his toes first and third on the left than first and second on the right. I'm going to change to silver alginate from silver collagen. Continue ketoconazole as I suspect underlying tinea pedis 05/26/17; left lateral leg venous insufficiency wound. We've been using Hydrofera Blue. I believe that there is expanding epithelialization over the surface of the wound albeit not coming from the wound circumference. This is [name] bit of an odd situation in which the epithelialization seems to be coming from the surface of the wound rather than in the exact circumference. There is still small open areas mostly along the lateral margin of the wound. ooHe has unchanged areas between the left first and second and the right first second toes which I been treating for tenia pedis 06/02/17; left lateral leg venous insufficiency wound. We have been using Hydrofera Blue. Somewhat smaller from the wound circumference. The surface of the wound remains [name] bit on it almost epithelialized sedation in appearance. I use an open curette today debridement in the surface of all of this especially the edges ooSmall open wounds remaining on the dorsal right first and second toe interspace and the plantar left first second toe and her face on the left 06/09/17; wound on the left lateral leg continues to be smaller but very gradual and very dry surface using Hydrofera Blue 06/16/17 requires weekly debridements now on the left lateral leg although this continues to contract. I changed to silver collagen last week because of dryness of the wound bed. Using Iodoflex to the areas on his first and second toes/web space bilaterally 06/24/17; patient with history of paraplegia also chronic venous insufficiency with lymphedema. Has [name] very  difficult wound on the left lateral leg. This has been gradually reducing in terms of with but comes in with [name] very dry adherent surface. High switch to silver collagen [name] week or so ago with hydrogel to keep the area moist. This is been refractory to multiple dressing attempts. He also has areas in his first and second toes bilaterally in the anterior and posterior web space. I had been using Iodoflex here after [name] prolonged course of silver alginate with ketoconazole was ineffective [question tinea pedis] 07/14/17; patient arrives today with [name] very difficult adherent material over his left lateral lower leg wound. He also has surrounding erythema and poorly controlled edema. He was switched his Santyl last  visit which the nurses are applying once during his doctor visit and once on [name] nurse visit. He was also reduced to 2 layer compression I'm not exactly sure of the issue here. 07/21/17; better surface today after 1 week of Iodoflex. Significant cellulitis that we treated last week also better. [Doxycycline] 07/28/17 better surface today with now 2 weeks of Iodoflex. Significant cellulitis treated with doxycycline. He has now completed the doxycycline and he is back to his usual degree of chronic venous inflammation/stasis dermatitis. He reminds me he has had ablations surgery here 08/04/17; continued improvement with Iodoflex to the left lateral leg wound in terms of the surface of the wound although the dimensions are better. 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 Problematic areas on the plantar aspect of the first second toe web space on the left and the dorsal aspect of the first second toe web space on the right. At one point I felt these were probably related to chronic fungal infections in treated him aggressively for this although we have not made any improvement here. 08/11/17; left lateral leg. Surface continues to improve  with the Iodoflex although we are not seeing much improvement in overall wound dimensions. Areas on his plantar left foot and right foot show no improvement. In fact the right foot looks somewhat worse 08/18/17; left lateral leg. We changed to [facility] Blue last week after [name] prolonged course of Iodoflex which helps get the surface better. It appears that the wound with is improved. Continue with difficult areas on the left dorsal first second and plantar first second on the right 09/01/17; patient arrives in clinic today having had [name] temperature of 103 yesterday. He was seen in the ER and [facility]. The patient was concerned he could have cellulitis again in the right leg however they diagnosed him with [name] UTI and he is now on Keflex. He has [name] history of cellulitis which is been recurrent and difficult but this is been in the left leg, in the past 5 use doxycycline. He does in and out catheterizations at home which are risk factors for UTI 09/08/17; patient will be completing his Keflex this weekend. The erythema on the left leg is considerably better. He has [name] new wound today on the medial part of the right leg small superficial almost looks like [name] skin tear. He has worsening of the area on the right dorsal first and second toe. His major area on the left lateral leg is better. Using Hydrofera Blue on all areas 09/15/17; gradual reduction in width on the long wound in the left lateral leg. No debridement required. He also has wounds on the plantar aspect of his left first second toe web space and on the dorsal aspect of the right first second toe web space. 09/22/17; there continues to be very gradual improvements in the dimensions of the left lateral leg wound. He hasn't round erythematous spot with might be pressure on his wheelchair. There is no evidence obviously of infection no purulence no warmth ooHe has [name] dry scaled area on the plantar aspect of the left first second toe ooImproved area on the  dorsal right first second toe. 09/29/17; left lateral leg wound continues to improve in dimensions mostly with an is still [name] fairly long but increasingly narrow wound. ooHe has [name] dry scaled area on the plantar aspect of his left first second toe web space ooIncreasingly concerning area on the dorsal right  first second toe. In fact I am concerned today about possible cellulitis around this wound. The areas extending up his second toe and although there is deformities here almost appears to abut on the nailbed. 10/06/17; left lateral leg wound continues to make very gradual progress. Tissue culture I did from the right first second toe dorsal foot last time grew MRSA and enterococcus which was vancomycin sensitive. This was not sensitive to clindamycin or doxycycline. He is allergic to Zyvox and sulfa we have therefore arrange for him to have dalvance infusion tomorrow. He is had this in the past and tolerated it well 10/20/17; left lateral leg wound continues to make decent progress. This is certainly reduced in terms of with there is advancing epithelialization.ooThe cellulitis in the right foot looks better although he still has [name] deep wound in the dorsal aspect of the first second toe web space. Plantar left first toe web space on the left I think is making some progress 10/27/17; left lateral leg wound continues to make decent progress. Advancing epithelialization.using Hydrofera Blue ooThe right first second toe web space wound is better-looking using silver alginate ooImprovement in the left plantar first second toe web space. Again using silver alginate 11/03/17 left lateral leg wound continues to make decent progress albeit slowly. Using Hydrofera Blue ooThe right per second toe web space continues to be [name] very problematic looking punched out wound. I obtained [name] piece of tissue for deep culture I did extensively treated this for fungus. It is difficult to imagine that this is [name] pressure  area as the patient states other than going outside he doesn't really wear shoes at home ooThe left plantar first second toe web space looked fairly senescent. Necrotic edges. This required debridement oochange to Hydrofera Blue to all wound areas 11/10/17; left lateral leg wound continues to contract. Using Hydrofera Blue ooOn the right dorsal first second toe web space dorsally. Culture I did of this area last week grew MRSA there is not an easy oral option in this patient was multiple antibiotic allergies or intolerances. This was only [name] rare culture isolate I'm therefore going to use Bactroban under silver alginate ooOn the left plantar first second toe web space. Debridement is required here. This is also unchanged 11/17/17; left lateral leg wound continues to contract using Hydrofera Blue this is no longer the major issue. ooThe major concern here is the right first second toe web space. He now has an open area going from dorsally to the plantar aspect. There is now wound on the inner lateral part of the first toe. Not [name] very viable surface on this. There is erythema spreading medially into the forefoot. ooNo major change in the left first second toe plantar wound 11/24/17; left lateral leg wound continues to contract using Hydrofera Blue. Nice improvement today ooThe right first second toe web space all of this looks [name] lot less angry than last week. I have given him clindamycin and topical Bactroban for MRSA and terbinafine for the possibility of underlining tinea pedis that I could not control with ketoconazole. Looks somewhat better ooThe area on the plantar left first second toe web space is weeping with dried debris around the wound 12/01/17; left lateral leg wound continues to contract he Hydrofera Blue. It is becoming thinner in terms of with nevertheless it is making good improvement. ooThe right first second toe web space looks less angry but still [name] large necrotic-looking  wounds starting on the plantar aspect of the right foot  extending between the toes and now extensively on the base of the right second toe. I gave him clindamycin and topical Bactroban for MRSA anterior benefiting for the possibility of underlying tinea pedis. Not looking better today ooThe area on the left first/second toe looks better. Debrided of necrotic debris 12/05/17* the patient was worked in urgently today because over the weekend he found blood on his incontinence bad when he woke up. He was found to have an ulcer by his wife who does most of his wound care. He came in today for [place] to look at this. He has not had [name] history of wounds in his buttocks in spite of his paraplegia. 12/08/17; seen in follow-up today at his usual appointment. He was seen earlier this week and found to have [name] new wound on his buttock. We also follow him for wounds on the left lateral leg, left first second toe web space and right first second toe web space 12/15/17; we have been using Hydrofera Blue to the left lateral leg which has improved. The right first second toe web space has also improved. Left first second toe web space plantar aspect looks stable. The left buttock has worsened using Santyl. Apparently the buttock has drainage 12/22/17; we have been using Hydrofera Blue to the left lateral leg which continues to improve now 2 small wounds separated by normal skin. He tells [place] he had [name] fever up to 100 yesterday he is prone to UTIs but has not noted anything different. He does in and out catheterizations. The area between the first and second toes today does not look good necrotic surface covered with what looks to be purulent drainage and erythema extending into the third toe. I had gotten this to something that I thought look better last time however it is not look good today. He also has [name] necrotic surface over the buttock wound which is expanded. I thought there might be infection under here so I removed [name]  lot of the surface with [name] #5 curet though nothing look like it really needed culturing. He is been using Santyl to this area 12/27/17; his original wound on the left lateral leg continues to improve using Hydrofera Blue. I gave him samples of Baxdella although he was unable to take them out of fear for an allergic reaction ["lump in his throat"].the culture I did of the purulent drainage from his second toe last week showed both enterococcus and [name] set Enterobacter I was also concerned about the erythema on the bottom of his foot although paradoxically although this looks somewhat better today. 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 We've been using silver alginate here. They came up today that he sleeps in [name] recliner, I'm not sure why but I asked him to stop this 01/03/18; his original wound we've been using Hydrofera Blue is now separated into 2 areas. ooUlcer on his left buttock is better he is off the recliner and sleeping in bed ooFinally both wound areas between his first and second toes also looks some better 01/10/18; his original wound on the left lateral leg is now separated into 2 wounds we've been using Hydrofera Blue ooUlcer on his left buttock has some drainage. There is [name] small probing site going into muscle layer superiorly.using silver alginate -He arrives today with [name] deep tissue injury on the left heel ooThe wound on the dorsal aspect of his first  second toe on the left looks [name] lot betterusing silver alginate ketoconazole ooThe area on the first second toe web space on the right also looks [name] lot bette 01/17/18; his original wound on the left lateral leg continues to progress using Hydrofera Blue ooUlcer on his left buttock also is smaller surface healthier except for [name] small probing site going into the muscle layer superiorly. 2.4 cm of tunneling in this area ooDTI on his left heel  we have only been offloading. Looks better than last week no threatened open no evidence of infection oothe wound on the dorsal aspect of the first second toe on the left continues to look like it's regressing we have only been using silver alginate and terbinafine orally ooThe area in the first second toe web space on the right also looks to be [name] lot better using silver alginate and terbinafine I think this was prompted by tinea pedis 01/31/18; the patient was hospitalized in [place] last week apparently for [name] complicated UTI. He was discharged on cefepime he does in and out catheterizations. In the hospital he was discovered M I don't mild elevation of AST and ALT and the terbinafine was stopped.predictably the pressure ulcer on s his buttock looks betterusing silver alginate. The area on the left lateral leg also is better using Hydrofera Blue. The area between the first and second toes on the left better. First and second toes on the right still substantial but better. Finally the DTI on the left heel has held together and looks like it's resolving 02/07/18-he is here in follow-up evaluation for multiple ulcerations. He has new injury to the lateral aspect of the last issue [name] pressure ulcer, he states this is from adhesive removal trauma. He states he has tried multiple adhesive products with no success. All other ulcers appear stable. The left heel DTI is resolving. We will continue with same treatment plan and follow-up next week. 02/14/18; follow-up for multiple areas. ooHe has [name] new area last week on the lateral aspect of his pressure ulcer more over the posterior trochanter. The original pressure ulcer looks quite stable has healthy granulation. 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 Almost fully epithelialized on the original superior area using Hydrofera Blue ooDTI on the left  heel has peeled off this week to reveal [name] small superficial wound under denuded skin and subcutaneous tissue ooBoth areas between the first and second toes look better including nothing open on the left 02/21/18; ooThe patient's wounds on his left ischial tuberosity and posterior left greater trochanter actually looked better. He has [name] large area of irritation around the area which I think is contact dermatitis. I am doubtful that this is fungal ooHis original wound on the left lateral calf continues to improve we have been using Hydrofera Blue ooThere is no open area in the left first second toe web space although there is [name] lot of thick callus ooThe DTI on the left heel required debridement today of necrotic surface eschar and subcutaneous tissue using silver alginate ooFinally the area on the right first second toe webspace continues to contract using silver alginate and ketoconazole 02/28/18 ooLeft ischial tuberosity wounds look better using silver alginate. ooOriginal wound on the left calf only has one small open area left using Hydrofera Blue ooDTI on the left heel required debridement mostly removing skin from around this wound surface. Using silver alginate ooThe areas on  the right first/second toe web space using silver alginate and ketoconazole 03/08/18 on evaluation today patient appears to be doing decently well as best I can tell in regard to his wounds. This is the first time that I have seen him as he generally is followed by Dr. Dellia Nims. With that being said none of his wounds appear to be infected he does have an area where there is some skin covering what appears to be [name] new wound on the left dorsal surface of his great toe. This is right at the nail bed. With that being said I do believe that debrided away some of the excess skin can be of benefit in this regard. Otherwise he has been tolerating the dressing changes without complication. 03/14/18; patient arrives today with  the multiplicity of wounds that we are following. He has not been systemically unwell ooOriginal wound on the left lateral calf now only has 2 small open areas we've been using Hydrofera Blue which should continue ooThe deep tissue injury on the left heel requires debridement today. We've been using silver alginate ooThe left first second toe and the right first second toe are both are reminiscence what I think was tinea pedis. Apparently some of the callus Surface between the toes was removed last week when it started draining. ooPurulent drainage coming from the wound on the ischial tuberosity on the left. 03/21/18-He is here in follow-up evaluation for multiple wounds. 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 He tolerated debridement. The only change to last week's recommendations is to discontinue antifungal cream between toes. He will follow-up next week 03/28/18; following up for multiple wounds;Concern this week is streaking redness and swelling in the right foot. He is going to need antibiotics for this. 03/31/18; follow-up for right foot cellulitis. Streaking redness and swelling in the right foot on 03/28/18. 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 I put him on clindamycin 300 mg every 6 and brought him in for [name] quick check. He has an open wound between his first and second toes on the right foot as [name] potential source. 04/04/18; ooRight foot cellulitis is resolving he is completing clindamycin. This is truly good news ooLeft lateral calf wound which is initial wound only has one small open area inferiorly this is close to healing out. He has compression stockings. We will use Hydrofera Blue right down to the epithelialization of this ooNonviable surface on the left heel which was initially pressure with [name] DTI. We've been using Hydrofera Blue. I'm going to switch this back to silver  alginate ooLeft first second toe/tinea pedis this looks better using silver alginate ooRight first second toe tinea pedis using silver alginate ooLarge pressure ulcers on theLeft ischial tuberosity. Small wound here Looks better. I am uncertain about the surface over the large wound. Using silver alginate 04/11/18; ooCellulitis in the right foot is resolved ooLeft lateral calf wound which was his original wounds still has 2 tiny open areas remaining this is just about closed ooNonviable surface on the left heel is better but still requires debridement ooLeft first second toe/tinea pedis still open using silver alginate ooRight first second toe wound tinea pedis I asked him to go back to using ketoconazole and silver alginate ooLarge pressure ulcers on the left ischial tuberosity this shear injury here is resolved. Wound is smaller. No evidence of infection using silver alginate 04/18/18; ooPatient arrives with an intense area of cellulitis in  the right mid lower calf extending into the right heel area. Bright red and warm. Smaller area on the left anterior leg. He has [name] significant history of MRSA. He will definitely need antibioticsoodoxycycline ooHe now has 2 open areas on the left ischial tuberosity the original large wound and now [name] satellite area which I think was above his initial satellite areas. Not [name] wonderful surface on this satellite area surrounding erythema which looks like pressure related. ooHis left lateral calf wound again his original wound is just about closed ooLeft heel pressure injury still requiring debridement ooLeft first second toe looks [name] lot better using silver alginate ooRight first second toe also using silver alginate and ketoconazole cream also looks better 04/20/18; the patient was worked in early today out of concerns with his cellulitis on the right leg. I had started him on doxycycline. This was 2 days ago. His wife was concerned about the  swelling in the area. Also concerned about the left buttock. He has not been systemically unwell no fever chills. No nausea vomiting or diarrhea 04/25/18; the patient's left buttock wound is continued to deteriorate he is using Hydrofera Blue. He is still completing clindamycin for the cellulitis on the right leg although all of this looks better. 05/02/18 ooLeft buttock wound still with [name] lot of drainage and [name] very tightly adherent fibrinous necrotic surface. He has [name] deeper area superiorly ooThe left lateral calf wound is still closed ooDTI wound on the left heel necrotic surface especially the circumference using Iodoflex ooAreas between his left first second toe and right first second toe both look better. Dorsally and the right first second toe he had [name] necrotic surface although at smaller. In using silver alginate and ketoconazole. I did [name] culture last week which was [name] deep tissue culture of the reminiscence of the open wound on the right first second toe dorsally. This grew [name] few Acinetobacter and [name] few methicillin-resistant staph aureus. Nevertheless the area actually this week looked better. I didn't feel the need to specifically address this at least in terms of systemic antibiotics. 05/09/18; wounds are measuring larger more drainage per our intake. We are using Santyl covered with alginate on the large superficial buttock wounds, Iodosorb on the left heel, ketoconazole and silver alginate to the dorsal first and second toes bilaterally. 05/16/18; ooThe area on his left buttock better in some aspects although the area superiorly over the ischial tuberosity required an extensive debridement.using Santyl ooLeft heel appears stable. Using Iodoflex ooThe areas between his first and second toes are not bad however there is spreading erythema up the dorsal aspect of his left foot this looks like cellulitis again. He is insensate the erythema is really very brilliant.o Erysipelas He went to  see an allergist days ago because he was itching part of this he had lab work done. This showed [name] white count of 15.1 with 70% neutrophils. Hemoglobin of 11.4 and [name] platelet count of 659,000. Last white count we had in Epic was [name] 2-1/2 years ago which was 25.9 but he was ill at the time. He was able to show me some lab work that was done by his primary physician the pattern is about the same. 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 But prompted me to go ahead and do x-rays of both feet and the pelvis rule out osteomyelitis. He also had [name] comprehensive metabolic panel this was reasonably normal his albumin was 3.7  liver function tests BUN/creatinine all normal 05/23/18; x-rays of both his feet from last week were negative for underlying pulmonary abnormality. The x-ray of his pelvis however showed mild irregularity in the left ischial which may represent some early osteomyelitis. The wound in the left ischial continues to get deeper clearly now exposed muscle. Each week necrotic surface material over this area. Whereas the rest of the wounds do not look so bad. ooThe left ischial wound we have been using Santyl and calcium alginate ooT the left heel surface necrotic debris using Iodoflex o ooThe left lateral leg is still healed ooAreas on the left dorsal foot and the right dorsal foot are about the same. There is some inflammation on the left which might represent contact dermatitis, fungal dermatitis I am doubtful cellulitis although this looks better than last week 05/30/18; CT scan done at Hospital did not show any osteomyelitis or abscess. Suggested the possibility of underlying cellulitis although I don't see [name] lot of evidence of this at the bedside ooThe wound itself on the left buttock/upper thigh actually looks somewhat better. No debridement ooLeft heel also looks better no debridement continue Iodoflex ooBoth dorsal first second toe spaces  appear better using Lotrisone. Left still required debridement 06/06/18; ooIntake reported some purulent looking drainage from the left gluteal wound. Using Santyl and calcium alginate ooLeft heel looks better although still [name] nonviable surface requiring debridement ooThe left dorsal foot first/second webspace actually expanding and somewhat deeper. I may consider doing [name] shave biopsy of this area ooRight dorsal foot first/second webspace appears stable to improved. Using Lotrisone and silver alginate to both these areas 06/13/18 ooLeft gluteal surface looks better. Now separated in the 2 wounds. No debridement required. Still drainage. We'll continue silver alginate ooLeft heel continues to look better with Iodoflex continue this for at least another week ooOf his dorsal foot wounds the area on the left still has some depth although it looks better than last week. We've been using Lotrisone and silver alginate 06/20/18 ooLeft gluteal continues to look better healthy tissue ooLeft heel continues to look better healthy granulation wound is smaller. He is using Iodoflex and his long as this continues continue the Iodoflex ooDorsal right foot looks better unfortunately dorsal left foot does not. There is swelling and erythema of his forefoot. He had minor trauma to this several days ago but doesn't think this was enough to have caused any tissue injury. Foot looks like cellulitis, we have had this problem before 06/27/18 on evaluation today patient appears to be doing [name] little worse in regard to his foot ulcer. 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 Both of which would really be his only options for treating this infection. In the past he has been given and effusion of Orbactiv. This is done very well for him in the past again it's one time dosing IV  antibiotic therapy. Subsequently I do believe this is something we're gonna need to see about doing at this point in time. Currently his other wounds seem to be doing somewhat better in my pinion I'm pretty happy in that regard. 07/03/18 on evaluation today patient's wounds actually appear to be doing fairly well. He has been tolerating the dressing changes without complication. All in all he seems to be showing signs of improvement. In regard to the antibiotics he has been dealing with infectious  disease since I saw him last week as far as getting this scheduled. In the end [facility]'s [facility] this coming Friday. In the meantime he has been continuing to perform the dressing changes in such as previous. There does not appear to be any evidence of infection worsengin at this time. 07/10/18; ooSince I last saw this man 2 weeks ago things have actually improved. IV antibiotics of resulted in less forefoot erythema although there is still some present. He is not systemically unwell ooLeft buttock wounds o2 now have no depth there is increased epithelialization Using silver alginate ooLeft heel still requires debridement using Iodoflex ooLeft dorsal foot still with [name] sizable wound about the size of [name] border but healthy granulation ooRight dorsal foot still with [name] slitlike area using silver alginate 07/18/18; the patient's cellulitis in the left foot is improved in fact I think it is on its way to resolving. ooLeft buttock wounds o2 both look better although the larger one has hypertension granulation we've been using silver alginate ooLeft heel has some thick circumferential redundant skin over the wound edge which will need to be removed today we've been using Iodoflex ooLeft dorsal foot is still [name] sizable wound required debridement using silver alginate ooThe right dorsal foot is just about closed only [name] small open area remains  here 07/25/18; left foot cellulitis is resolved ooLeft buttock wounds o2 both look better. Hyper-granulation on the major area ooLeft heel as some debris over the surface but otherwise looks [name] healthier wound. Using silver collagen ooRight dorsal foot is just about closed 07/31/18; arrives with our intake nurse worried about purulent drainage from the buttock. We had hyper-granulation here last week ooHis buttock wounds o2 continue to look better ooLeft heel some debris over the surface but measuring smaller. ooRight dorsal foot unfortunately has openings between the toes ooLeft foot superficial wound looks less aggravated. 08/07/18 ooButtock wounds continue to look better although some of her granulation and the larger medial wound. silver alginate ooLeft heel continues to look [name] lot better.silver collagen ooLeft foot superficial wound looks less stable. Requires debridement. He has [name] new wound superficial area on the foot on the lateral dorsal foot. ooRight foot looks better using silver alginate without Lotrisone 08/14/2018; patient was in the ER last week diagnosed with [name] UTI. He is now on Cefpodoxime and Macrodantin. ooButtock wounds continued to be smaller. Using silver alginate ooLeft heel continues to look better using silver collagen ooLeft foot superficial wound looks as though it is improving ooRight dorsal foot area is just about healed. 08/21/2018; patient is completed his antibiotics for his UTI. ooHe has 2 open areas on the buttocks. There is still not closed although the surface looks satisfactory. Using silver alginate ooLeft heel continues to improve using silver collagen ooThe bilateral dorsal foot areas which are at the base of his first and second toes/possible tinea pedis are actually stable on the left but worse on the right. The area on the left required debridement of necrotic surface. After debridement I obtained [name] specimen for PCR culture. ooThe right  dorsal foot which is been just about healed last week is now reopened 08/28/2018; culture done on the left dorsal foot showed coag negative staph both staph epidermidis and Lugdunensis. I think this is worthwhile initiating systemic treatment. I will use doxycycline given his long list of allergies. The area on the left heel slightly improved but still requiring debridement. ooThe  large wound on the buttock is just about closed whereas the smaller one is larger. Using silver alginate in this area 09/04/2018; patient is completing his doxycycline for the left foot although this continues to be [name] very difficult wound area with very adherent necrotic debris. We are using silver alginate to all his wounds right foot left foot and the small wounds on his buttock, silver collagen on the left heel. 09/11/2018; once again this patient has intense erythema and swelling of the left forefoot. Lesser degrees of erythema in the right foot. He has [name] long list of allergies and intolerances. I will reinstitute doxycycline. 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 Using silver alginate ooLeft heel also looks better using silver collagen ooUnfortunately both the areas on his feet look worse. The area on the left first second webspace is now gone through to the plantar part of his foot. The area on the left foot anteriorly is irritated with erythema and swelling in the forefoot. 09/25/2018 ooHis wound on the left plantar heel looks better. Using silver collagen ooThe area on the left buttock 2 small remnant areas. One is closed one is still open. Using silver alginate ooThe areas between both his first and second toes look worse. This in spite of long-standing antifungal therapy with ketoconazole and silver alginate which should have antifungal activity ooHe has small areas around his original wound on the left calf one is on the bottom of the original scar  tissue and one superiorly both of these are small and superficial but again given wound history in this site this is worrisome 10/02/2018 ooLeft plantar heel continues to gradually contract using silver collagen ooLeft buttock wound is unchanged using silver alginate ooThe areas on his dorsal feet between his first and second toes bilaterally look about the same. 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 Small open area here. We have been using silver alginate 10/09/2018; ooLeft plantar heel continues to look healthy and contract. No debridement is required ooLeft buttock slightly smaller with [name] tape injury wound just below which was new this week ooDorsal feet somewhat improved I have been using clindamycin ooLeft lateral looks lower extremity the actual open area looks worse although [name] lot of this is epithelialized. I am going to change to silver collagen today He has [name] lot more swelling in the right leg although this is not pitting not red and not particularly warm there is [name] lot of spasm in the right leg usually indicative of people with paralysis of some underlying discomfort. We have reviewed his vascular status from 2017 he had [name] left greater saphenous vein ablation. I wonder about referring him back to vascular surgery if the area on the left leg continues to deteriorate. 10/16/2018 in today for follow-up and management of multiple lower extremity ulcers. His left Buttock wound is much lower smaller and almost closed completely. The wound to the left ankle has began to reopen with Epithelialization and some adherent slough. He has multiple new areas to the left foot and leg. The left dorsal foot without much improvement. Wound present between left great webspace and 2nd toe. Erythema and edema present  right leg. Right LE ultrasound obtained on 10/10/18 was negative for DVT . 10/23/2018; ooLeft buttock is closed over. Still  dry macerated skin but there is no open wound. I suspect this is chronic pressure/moisture ooLeft lateral calf is quite [name] bit worse than when I saw this last. There is clearly drainage here he has macerated skin into the left plantar heel. We will change the primary dressing to alginate ooLeft dorsal foot has some improvement in overall wound area. Still using clindamycin and silver alginate ooRight dorsal foot about the same as the left using clindamycin and silver alginate ooThe erythema in the right leg has resolved. He is DVT rule out was negative ooLeft heel pressure area required debridement although the wound is smaller and the surface is health 10/26/2018 ooThe patient came back in for his nurse check today predominantly because of the drainage coming out of the left lateral leg with [name] recent reopening of his original wound on the left lateral calf. He comes in today with [name] large amount of surrounding erythema around the wound extending from the calf into the ankle and even in the area on the dorsal foot. He is not systemically unwell. He is not febrile. Nevertheless this looks like cellulitis. We have been using silver alginate to the area. I changed him to [name] regular visit and I am going to prescribe him doxycycline. The rationale here is [name] long list of medication intolerances and [name] history of MRSA. I did not see anything that I thought would provide [name] valuable culture 10/30/2018 ooFollow-up from his appointment 4 days ago with really an extensive area of cellulitis in the left calf left lateral ankle and left dorsal foot. I put him on doxycycline. He has [name] long list of medication allergies which are true allergy reactions. 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 In any case he is [name] lot  better today. The erythema especially in the anterior and lateral left calf is better. He still has left ankle erythema. He also is complaining about increasing edema in the right leg we have only been using Kerlix Coban and he has been doing the wraps at home. Finally he has [name] spotty rash on the medial part of his upper left calf which looks like folliculitis or perhaps wrap occlusion type injury. Small superficial macules not pustules 11/06/18 patient arrives today with again [name] considerable degree of erythema around the wound on the left lateral calf extending into the dorsal ankle and dorsal foot. This is [name] lot worse than when I saw this last week. He is on doxycycline really with not [name] lot of improvement. He has not been systemically unwell Wounds on the; left heel actually looks improved. Original area on the left foot and proximity to the first and second toes looks about the same. He has superficial areas on the dorsal foot, anterior calf and then the reopening of his original wound on the left lateral calf which looks about the same ooThe only area he has on the right is the dorsal webspace first and second which is smaller. ooHe has [name] large area of dry erythematous skin on the left buttock small open area here. 11/13/2018; the patient arrives in much better condition. The erythema around the wound on the left lateral calf is [name] lot better. Not sure whether this was the clindamycin or the TCA and ketoconazole or just in the improvement in edema control [stasis dermatitis]. In any case this is [name] lot better. The area on the left heel is very small and just about  resolved using silver collagen we have been using silver alginate to the areas on his dorsal feet 11/20/2018; his wounds include the left lateral calf, left heel, dorsal aspects of both feet just proximal to the first second webspace. He is stable to slightly improved. I did not think any changes to his dressings were going to be  necessary 11/27/2018 he has [name] reopening on the left buttock which is surrounded by what looks like tinea or perhaps some other form of dermatitis. The area on the left dorsal foot has some erythema around it I have marked this area but I am not sure whether this is cellulitis or not. Left heel is not closed. Left calf the reopening is really slightly longer and probably worse 1/13; in general things look better and smaller except for the left dorsal foot. Area on the left heel is just about closed, left buttock looks better only [name] small wound remains in the skin looks better [using Lotrisone] 1/20; the area on the left heel only has [name] few remaining open areas here. Left lateral calf about the same in terms of size, left dorsal foot slightly larger right lateral foot still not closed. The area on the left buttock has no open wound and the surrounding skin looks [name] lot better 1/27; the area on the left heel is closed. Left lateral calf better but still requiring extensive debridements. The area on his left buttock is closed. He still has the open areas on the left dorsal foot which is slightly smaller in the right foot which is slightly expanded. We have been using Iodoflex on these areas as well 2/3; left heel is closed. Left lateral calf still requiring debridement using Iodoflex there is no open area on his left buttock however he has dry scaly skin over [name] large area of this. Not really responding well to the Lotrisone. Finally the areas on his dorsal feet at the level of the first second webspace are slightly smaller on the right and about the same on the left. Both of these vigorously debrided with Anasept and gauze 2/10; left heel remains closed he has dry erythematous skin over the left buttock but there is no open wound here. Left lateral leg has come in and with. Still requiring debridement we have been using Iodoflex here. Finally the area on the left dorsal foot and right dorsal foot are really  about the same extremely dry callused fissured areas. He does not yet have [name] dermatology appointment 2/17; left heel remains closed. He has [name] new open area on the left buttock. The area on the left lateral calf is bigger longer and still covered in necrotic debris. No major change in his foot areas bilaterally. I am awaiting for [name] dermatologist to look on this. We have been using ketoconazole I do not know that this is been doing any good at all. 2/24; left heel remains closed. The left buttock wound that was new reopening last week looks better. The left lateral calf appears better also although still requires debridement. The major area on his foot is the left first second also requiring debridement. We have been putting Prisma on all wounds. I do not believe that the ketoconazole has done too much good for his feet. He will use Lotrisone I am going to give him [name] 2-week course of terbinafine. We still do not have [name] dermatology appointment 3/2 left heel remains closed however there is skin over bone in this area I pointed this out  to him today. The left buttock wound is epithelialized but still does not look completely stable. The area on the left leg required debridement were using silver collagen here. With regards to his feet we changed to Lotrisone last week and silver alginate. 3/9; left heel remains closed. Left buttock remains closed. The area on the right foot is essentially closed. The left foot remains unchanged. Slightly smaller on the left lateral calf. Using silver collagen to both of these areas 3/16-Left heel remains closed. Area on right foot is closed. Left lateral calf above the lateral malleolus open wound requiring debridement with easy bleeding. Left dorsal wound proximal to first toe also debrided. Left ischial area open new. Patient has been using Prisma with wrapping every 3 days. Dermatology appointment is apparently tomorrow.Patient has completed his terbinafine 2-week  course with some apparent improvement according to him, there is still flaking and dry skin in his foot on the left 3/23; area on the right foot is reopened. The area on the left anterior foot is about the same still [name] very necrotic adherent surface. He still has the area on the left leg and reopening is on the left buttock. He apparently saw dermatology although I do not have [name] note. According to the patient who is usually fairly well informed they did not have any good ideas. Put him on oral terbinafine which she is been on before. 3/30; using silver collagen to all wounds. Apparently his dermatologist put him on doxycycline and rifampin presumably some culture grew staph. I do not have this result. He remains on terbinafine although I have used terbinafine on him before 4/6; patient has had [name] fairly substantial reopening on the right foot between the first and second toes. He is finished his terbinafine and I believe is on doxycycline and rifampin still as prescribed by dermatology. We have been using silver collagen to all his wounds although the patient reports that he thinks silver alginate does better on the wounds on his buttock. 4/13; the area on his left lateral calf about the same size but it did not require debridement. ooLeft dorsal foot just proximal to the webspace between the first and second toes is about the same. Still nonviable surface. I note some superficial bronze discoloration of the dorsal part of his foot ooRight dorsal foot just proximal to the first and second toes also looks about the same. I still think there may be the same discoloration I noted above on the left ooLeft buttock wound looks about the same 4/20; left lateral calf appears to be gradually contracting using silver collagen. 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 The left dorsal foot wound is debrided of tightly adherent necrotic debris and  really cleans up quite nicely. The right area is worse with expansion. I did not debride this it is now over the base of the second toe ooThe area on his left buttock is smaller no debridement is required using silver collagen 5/4; left calf continues to make good progress. ooHe arrives with erythema around the wounds on his dorsal foot which even extends to the plantar aspect. Very concerning for coexistent infection. He is finished the erythromycin I gave him for possible erythrasma this does not seem to have helped. ooThe area on the left foot is about the same base of the dorsal toes ooIs area on the buttock looks improved on the left 5/11; left calf and left buttock continued to make good progress. Left  foot is about the same to slightly improved. ooMajor problem is on the right foot. He has not had an x-ray. Deep tissue culture I did last week showed both Enterobacter and E. coli. I did not change the doxycycline I put him on empirically although neither 1 of these were plated to doxycycline. He arrives today with the erythema looking worse on both the dorsal and plantar foot. Macerated skin on the bottom of the foot. he has not been systemically unwell 5/18-Patient returns at 1 week, left calf wound appears to be making some progress, left buttock wound appears slightly worse than last time, left foot wound looks slightly better, right foot redness is marginally better. X-ray of both feet show no air or evidence of osteomyelitis. Patient is finished his Omnicef and terbinafine. He continues to have macerated skin on the bottom of the left foot as well as right 5/26; left calf wound is better, left buttock wound appears to have multiple small superficial open areas with surrounding macerated skin. X-rays that I did last time showed no evidence of osteomyelitis in either foot. He is finished cefdinir and doxycycline. I do not think that he was on terbinafine. He continues to have [name] large  superficial open area on the right foot anterior dorsal and slightly between the first and second toes. I did send him to dermatology 2 months ago or so wondering about whether they would do [name] fungal scraping. I do not believe they did but did do [name] culture. We have been using silver alginate to the toe areas, he has been using antifungals at home topically either ketoconazole or Lotrisone. We are using silver collagen on the left foot, silver alginate on the right, silver collagen on the left lateral leg and silver alginate on the left buttock 6/1; left buttock area is healed. We have the left dorsal foot, left lateral leg and right dorsal foot. We are using silver alginate to the areas on both feet and silver collagen to the area on his left lateral calf 6/8; the left buttock apparently reopened late last week. He is not really sure how this happened. He is tolerating the terbinafine. Using silver alginate to all wounds 6/15; left buttock wound is larger than last week but still superficial. ooCame in the clinic today with [name] report of purulence from the left lateral leg I did not identify any infection ooBoth areas on his dorsal feet appear to be better. He is tolerating the terbinafine. Using silver alginate to all wounds 6/22; left buttock is about the same this week, left calf quite [name] bit better. His left foot is about the same however he comes in with erythema and warmth in the right forefoot once again. Culture that I gave him in the beginning of May showed Enterobacter and E. coli. I gave him doxycycline and things seem to improve although neither 1 of these organisms was specifically plated. 6/29; left buttock is larger and dry this week. Left lateral calf looks to me to be improved. Left dorsal foot also somewhat improved right foot completely unchanged. The erythema on the right foot is still present. He is completing the Ceftin dinner that I gave him empirically [see discussion  above.) 7/6 - All wounds look to be stable and perhaps improved, the left buttock wound is slightly smaller, per patient bleeds easily, completed ceftin, the right foot redness is less, he is on terbinafine 7/13; left buttock wound about the same perhaps slightly narrower. Area on the left  lateral leg continues to narrow. Left dorsal foot slightly smaller right foot about the same. We are using silver alginate on the right foot and Hydrofera Blue to the areas on the left. Unna boot on the left 2 layer compression on the right 7/20; left buttock wound absolutely the same. Area on lateral leg continues to get better. Left dorsal foot require debridement as did the right no major change in the 7/27; left buttock wound the same size necrotic debris over the surface. The area on the lateral leg is closed once again. His left foot looks better right foot about the same although there is some involvement now of the posterior first second toe area. He is still on terbinafine which I have given him for [name] month, not certain [name] centimeter major change 06/25/19-All wounds appear to be slightly improved according to report, left buttock wound looks clean, both foot wounds have minimal to no debris the right dorsal foot has minimal slough. We are using Hydrofera Blue to the left and silver alginate to the right foot and ischial wound. 8/10-Wounds all appear to be around the same, the right forefoot distal part has some redness which was not there before, however the wound looks clean and small. Ischial wound looks about the same with no changes 8/17; his wound on the left lateral calf which was his original chronic venous insufficiency wound remains closed. Since I last saw him the areas on the left dorsal foot right dorsal foot generally appear better but require debridement. The area on his left initial tuberosity appears somewhat larger to me perhaps hyper granulated and bleeds very easily. We have been using  Hydrofera Blue to the left dorsal foot and silver alginate to everything else 8/24; left lateral calf remains closed. The areas on his dorsal feet on the webspace of the first and second toes bilaterally both look better. The area on the left buttock which is the pressure ulcer stage II slightly smaller. I change the dressing to Hydrofera Blue to all areas 8/31; left lateral calf remains closed. The area on his dorsal feet bilaterally look better. Using Hydrofera Blue. Still requiring debridement on the left foot. No change in the left buttock pressure ulcers however 9/14; left lateral calf remains closed. Dorsal feet look quite [name] bit better than 2 weeks ago. Flaking dry skin also [name] lot better with the ammonium lactate I gave him 2 weeks ago. The area on the left buttock is improved. He states that his Roho cushion developed [name] leak and he is getting [name] new one, in the interim he is offloading this vigorously 9/21; left calf remains closed. Left heel which was [name] possible DTI looks better this week. He had macerated tissue around the left dorsal foot right foot looks satisfactory and improved left buttock wound. I changed his dressings to his feet to silver alginate bilaterally. Continuing Hydrofera Blue on the left buttock. 9/28 left calf remains closed. Left heel did not develop anything [possible DTI] dry flaking skin on the left dorsal foot. Right foot looks satisfactory. Improved left buttock wound. We are using silver alginate on his feet Hydrofera Blue on the buttock. I have asked him to go back to the Lotrisone on his feet including the wounds and surrounding areas 10/5; left calf remains closed. The areas on the left and right feet about the same. [name] lot of this is epithelialized however debris over the remaining open areas. He is using Lotrisone and silver alginate. The area on  the left buttock using Hydrofera Blue 10/26. Patient has been out for 3 weeks secondary to Covid concerns. He tested  negative but I think his wife tested positive. He comes in today with the left foot substantially worse, right foot about the same. Even more concerning he states that the area on his left buttock closed over but then reopened and is considerably deeper in one aspect than it was before [stage III wound] 11/2; left foot really about the same as last week. Quarter sized wound on the dorsal foot just proximal to the first second toes. Surrounding erythema with areas of denuded epithelium. This is not really much different looking. Did not look like cellulitis this time however. ooRight foot area about the same.. We have been using silver alginate alginate on his toes ooLeft buttock still substantial irritated skin around the wound which I think looks somewhat better. We have been using Hydrofera Blue here. 11/9; left foot larger than last week and [name] very necrotic surface. Right foot I think is about the same perhaps slightly smaller. Debris around the circumference also addressed. Unfortunately on the left buttock there is been [name] decline. Satellite lesions below the major wound distally and now [name] an additional one posteriorly we have been using Hydrofera Blue but I think this is [name] pressure issue 11/16; left foot ulcer dorsally again [name] very adherent necrotic surface. Right foot is about the same. Not much change in the pressure ulcer on his left buttock. 11/30; left foot ulcer dorsally basically the same as when I saw him 2 weeks ago. Very adherent fibrinous debris on the wound surface. Patient reports [name] lot of drainage as well. 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 We have been using Iodoflex, patient changed back to alginate because of the drainage. Area on his right dorsal foot really looks benign with [name] healthier surface certainly [name] lot better than on the left. Left buttock wounds all improved using Hydrofera Blue 12/7; left dorsal foot again no  improvement. Tightly adherent debris. PCR culture I did last week only showed likely skin contaminant. I have gone ahead and done [name] punch biopsy of this which is about the last thing in terms of investigations I can think to do. He has known venous insufficiency and venous hypertension and this could be the issue here. The area on the right foot is about the same left buttock slightly worse according to our intake nurse secondary to [facility] Blue sticking to the wound 12/14; biopsy of the left foot that I did last time showed changes that could be related to wound healing/chronic stasis dermatitis phenomenon no neoplasm. We have been using silver alginate to both feet. I change the one on the left today to Sorbact and silver alginate to his other 2 wounds 12/28; the patient arrives with the following problems; ooMajor issue is the dorsal left foot which continues to be [name] larger deeper wound area. Still with [name] completely nonviable surface ooParadoxically the area mirror image on the right on the right dorsal foot appears to be getting better. ooHe had some loss of dry denuded skin from the lower part of his original wound on the left lateral calf. Some of this area looked [name] little vulnerable and for this reason we put him in wrap that on this side this week ooThe area on his left buttock is larger. He still has the erythematous circular area which I think is [name] combination of pressure,  sweat. This does not look like cellulitis or fungal dermatitis 11/26/2019; -Dorsal left foot large open wound with depth. Still debris over the surface. Using Sorbact ooThe area on the dorsal right foot paradoxically has closed over [facility] has [name] reopening on the left ankle laterally at the base of his original wound that extended up into the calf. This appears clean. ooThe left buttock wound is smaller but with very adherent necrotic debris over the surface. We have been using silver alginate here as well The  patient had arterial studies done in 2017. He had biphasic waveforms at the dorsalis pedis and posterior tibial bilaterally. ABI in the left was 1.17. Digit waveforms were dampened. He has slight spasticity in the great toes I do not think [name] TBI would be possible 1/11; the patient comes in today with [name] sizable reopening between the first and second toes on the right. This is not exactly in the same location where we have been treating wounds previously. According to our intake nurse this was actually fairly deep but 0.6 cm. The area on the left dorsal foot looks about the same the surface is somewhat cleaner using Sorbact, his MRI is in 2 days. We have not managed yet to get arterial studies. The new reopening on the left lateral calf looks somewhat better using alginate. The left buttock wound is about the same using alginate 1/18; the patient had his ARTERIAL studies which were quite normal. ABI in the right at 1.13 with triphasic/biphasic waveforms on the left ABI 1.06 again with triphasic/biphasic waveforms. It would not have been possible to have done [name] toe brachial index because of spasticity. We have been using Sorbac to the left foot alginate to the rest of his wounds on the right foot left lateral calf and left buttock 1/25; arrives in clinic with erythema and swelling of the left forefoot worse over the first MTP area. This extends laterally dorsally and but also posteriorly. Still has an area on the left lateral part of the lower part of his calf wound it is eschared and clearly not closed. ooArea on the left buttock still with surrounding irritation and erythema. ooRight foot surface wound dorsally. The area between the right and first and second toes appears better. 2/1; ooThe left foot wound is about the same. Erythema slightly better I gave him [name] week of doxycycline empirically ooRight foot wound is more extensive extending between the toes to the plantar surface ooLeft lateral  calf really no open surface on the inferior part of his original wound however the entire area still looks vulnerable ooAbsolutely no improvement in the left buttock wound required debridement. 2/8; the left foot is about the same. Erythema is slightly improved I gave him clindamycin last week. ooRight foot looks better he is using Lotrimin and silver alginate ooHe has [name] breakdown in the left lateral calf. Denuded epithelium which I have removed ooLeft buttock about the same were using Hydrofera Blue 2/15; left foot is about the same there is less surrounding erythema. Surface still has tightly adherent debris which I have debriding however not making any progress ooRight foot has [name] substantial wound on the medial right second toe between the first and second webspace. ooStill an open area on the left lateral calf distal area. ooButtock wound is about the same 2/22; left foot is about the same less surrounding erythema. Surface has adherent debris. Polymen Ag Right foot area significant wound between the first and second toes. We have been using silver  alginate here Left lateral leg polymen Ag at the base of his original venous insufficiency wound ooLeft buttock some improvement here 3/1; ooRight foot is deteriorating in the first second toe webspace. Larger and more substantial. We have been using silver alginate. ooLeft dorsal foot about the same markedly adherent surface debris using PolyMem Ag ooLeft lateral calf surface debris using PolyMem AG ooLeft buttock is improved again using PolyMem Ag. ooHe is completing his terbinafine. The erythema in the foot seems better. He has been on this for 2 weeks 3/8; no improvement in any wound area in fact he has [name] small open area on the dorsal midfoot which is new this week. He has not gotten his foot x-rays yet 3/15; his x-rays were both negative for osteomyelitis of both feet. No major change in any of his wounds on the extremities  however his buttock wounds are better. We have been using polymen on the buttocks, left lower leg. Iodoflex on the left foot and silver alginate on the right 3/22; arrives in clinic today with the 2 major issues are the improvement in the left dorsal foot wound which for once actually looks healthy with [name] nice healthy wound surface without debridement. Using Iodoflex here. Unfortunately on the left lateral calf which is in the distal part of his original wound he came to the clinic here for there was purulent drainage noted some increased breakdown scattered around the original area and [name] small area proximally. We we are using polymen here will change to silver alginate today. His buttock wound on the left is better and I think the area on the right first second toe webspace is also improved 3/29; left dorsal foot looks better. Using Iodoflex. Left ankle culture from deterioration last time grew E. coli, Enterobacter and Enterococcus. I will give him [name] course of cefdinir although that will not cover Enterococcus. The area on the right foot in the webspace of the first and second toe lateral first toe looks better. The area on his buttock is about healed Vascular appointment is on April 21. This is to look at his venous system vis--vis continued breakdown of the wounds on the left including the left lateral leg and left dorsal foot he. He has had previous ablations on this side 4/5; the area between the right first and second toes lateral aspect of the first toe looks better. Dorsal aspect of the left first toe on the left foot also improved. Unfortunately the left lateral lower leg is larger and there is [name] second satellite wound superiorly. The usual superficial abrasions on the left buttock overall better but certainly not closed 4/12; the area between the right first and second toes is improved. Dorsal aspect of the left foot also slightly smaller with [name] vibrant healthy looking surface. No real  change in the left lateral leg and the left buttock wound is healed He has an unaffordable co-pay for Apligraf. Appointment with vein and vascular with regards to the left leg venous part of the circulation is on 4/21 4/19; we continue to see improvement in all wound areas. Although this is minor. He has his vascular appointment on 4/21. The area on the left buttock has not reopened although right in the center of this area the skin looks somewhat threatened 4/26; the left buttock is unfortunately reopened. In general his left dorsal foot has [name] healthy surface and looks somewhat smaller although it was not measured as such. The area between his first and second toe webspace  on the right as [name] small wound against the first toe. The patient saw vascular surgery. The real question I was asking was about the small saphenous vein on the left. He has previously ablated left greater saphenous vein. Nothing further was commented on on the left. Right greater saphenous vein without reflux at the saphenofemoral junction or proximal thigh there was no indication for ablation of the right greater saphenous vein duplex was negative for DVT bilaterally. They did not think there was anything from [name] vascular surgery point of view that could be offered. They ABIs within normal limits 5/3; only small open area on the left buttock. The area on the left lateral leg which was his original venous reflux is now 2 wounds both which look clean. We are using Iodoflex on the left dorsal foot which looks healthy and smaller. He is down to [name] very tiny area between the right first and second toes, using silver alginate 5/10; all of his wounds appear better. We have much better edema control in 4 layer compression on the left. This may be the factor that is allowing the left foot and left lateral calf to heal. He has external compression garments at home 04/14/20-All of his wounds are progressing well, the left forefoot is  practically closed, left ischium appears to be about the same, right toe webspace is also smaller. The left lateral leg is about the same, continue using Hydrofera Blue to this, silver alginate to the ischium, Iodoflex to the toe space on the right 6/7; most of his wounds outside of the left buttock are doing well. The area on the left lateral calf and left dorsal foot are smaller. The area on the right foot in between the first and second toe webspace is barely visible although he still says there is some drainage here is the only reason I did not heal this out. ooUnfortunately the area on the left buttock almost looks like he has [name] skin tear from tape. He has open wound and then [name] large flap of skin that we are trying to get adherence over an area just next to the remaining wound 6/21; 2 week follow-up. I believe is been here for nurse visits. Miraculously the area between his first and second toes on the left dorsal foot is closed over. Still open on the right first second web space. The left lateral calf has 2 open areas. Distally this is more superficial. The proximal area had [name] little more depth and required debridement of adherent necrotic material. His buttock wound is actually larger we have been using silver alginate here 6/28; the patient's area on the left foot remains closed. Still open wet area between the first and second toes on the right and also extending into the plantar aspect. We have been using silver alginate in this location. He has 2 areas on the left lower leg part of his original long wounds which I think are better. We have been using Hydrofera Blue here. Hydrofera Blue to the left buttock which is stable 7/12; left foot remains closed. Left ankle is closed. May be [name] small area between his right first and second toes the only truly open area is on the left buttock. We have been using Hydrofera Blue here 7/19; patient arrives with marked deterioration especially in the left  foot and ankle. We did not put him in [name] compression wrap on the left last week in fact he wore his juxta lite stockings on either side although  he does not have an underlying stocking. He has [name] reopening on the left dorsal foot, left lateral ankle and [name] new area on the right dorsal ankle. More worrisome is the degree of erythema on the left foot extending on the lateral foot into the lateral lower leg on the left 7/26; the patient had erythema and drainage from the lateral left ankle last week. Culture of this grew MRSA resistant to doxycycline and clindamycin which are the 2 antibiotics we usually use with this patient who has multiple antibiotic allergies including linezolid, trimethoprim sulfamethoxazole. I had give him an empiric doxycycline and he comes in the area certainly looks somewhat better although it is blotchy in his lower leg. He has not been systemically unwell. He has had areas on the left dorsal foot which is [name] reopening, chronic wounds on the left lateral ankle. Both of these I think are secondary to chronic venous insufficiency. The area between his first and second toes is closed as far as I can tell. He had [name] new wrap injury on the right dorsal ankle last week. Finally he has an area on the left buttock. We have been using silver alginate to everything except the left buttock we are using Hydrofera Blue 06/30/20-Patient returns at 1 week, has been given [name] sample dose pack of NUZYRA which is [name] tetracycline derivative [omadacycline], patient has completed those, we have been using silver alginate to almost all the wounds except the left ischium where we are using Hydrofera Blue all of them look better 8/16; since I last saw the patient he has been doing well. The area on the left buttock, left lateral ankle and left foot are all closed today. He has completed the [place] I gave him last time and tolerated this well. He still has open areas on the right dorsal ankle and in the right first  second toe area which we are using silver alginate. 8/23; we put him in his bilateral external compression stockings last week as he did not have anything open on either leg except for concerning area between the right first and second toe. He comes in today with an area on the left dorsal foot slightly more proximal than the original wound, the left lateral foot but this is actually [name] continuation of the area he had on the left lateral ankle from last time. As well he is opened up on the left buttock again. 8/30; comes in today with things looking [name] lot better. The area on the left lower ankle has closed down as has the left foot but with eschar in both areas. The area on the dorsal right ankle is also epithelialized. Very little remaining of the left buttock wound. We have been using silver alginate on all wound areas 9/13; the area in the first second toe webspace on the right has fully epithelialized. He still has some vulnerable epithelium on the right and the ankle and the dorsal foot. He notes weeping. He is using his juxta lite stocking. On the left again the left dorsal foot is closed left lateral ankle is closed. We went to the juxta lite stocking here as well. ooStill vulnerable in the left buttock although only 2 small open areas remain here 9/27; 2-week follow-up. We did not look at his left leg but the patient says everything is closed. He is [name] bit disturbed by the amount of edema in his left foot he is using juxta lite stockings but asking about over the toes stockings  which would be 30/40, will talk to him next time. According to him there is no open wound on either the left foot or the left ankle/calf He has an open area on the dorsal right calf which I initially point [name] wrap injury. He has superficial remaining wound on the left ischial tuberosity been using silver alginate although he says this sticks to the wound 10/5; we gave him 2-week follow-up but he called yesterday  expressing some concerns about his right foot right ankle and the left buttock. He came in early. There is still no open areas on the left leg and that still in his juxta lite stocking 10/11; he only has 1 small area on the left buttock that remains measuring millimeters 1 mm. Still has the same irritated skin in this area. We recommended zinc oxide when this eventually closes and pressure relief is meticulously is he can do this. He still has an area on the dorsal part of his right first through third toes which is [name] bit irritated and still open and on the dorsal ankle near the crease of the ankle. We have been using silver alginate and using his own stocking. He has nothing open on the left leg or foot 10/25; 2-week follow-up. Not nearly as good on the left buttock as I was hoping. For open areas with 5 looking threatened small. He has the erythematous irritated chronic skin in this area. oo1 area on the right dorsal ankle. He reports this area bleeds easily ooRight dorsal foot just proximal to the base of his toes ooWe have been using silver alginate. 11/8; 2-week follow-up. Left buttock is about the same although I do not think the wounds are in the same location we have been using silver alginate. I have asked him to use zinc oxide on the skin around the wounds. ooHe still has [name] small area on the right dorsal ankle he reports this bleeds easily ooRight dorsal foot just proximal to the base of the toes does not have anything open although the skin is very dry and scaly ooHe has [name] new opening on the nailbed of the left great toe. Nothing on the left ankle 11/29; 3-week follow-up. Left buttock has 2 open areas. And washing of these wounds today started bleeding easily. Suggesting very friable tissue. We have been using silver alginate. Right dorsal ankle which I thought was initially [name] wrap injury we have been using silver alginate. Nothing open between the toes that I can see. He states  the area on the left dorsal toe nailbed healed after the last visit in 2 or 3 days 12/13; 3-week follow-up. His left buttock now has 3 open areas but the original 2 areas are smaller using polymen here. Surrounding skin looks better. The right dorsal ankle is closed. He has [name] small opening on the right dorsal foot at the level of the third toe. In general the skin looks better here. He is wearing his juxta lite stocking on the left leg says there is nothing open 11/24/2020; 3 weeks follow-up. His left buttock still has the 3 open areas. We have been using polymen but due to lack of response he changed to [facility] area. Surrounding skin is dry erythematous and irritated looking. There is no evidence of infection either bacterial or fungal however there is loss of surface epithelium ooHe still has very dry skin in his foot causing irritation and erythema on the dorsal part of his toes. This is not responded to  prolonged courses of antifungal simply looks dry and irritated 1/24; left buttock area still looks about the same he was unable to find the triad ointment that we had suggested. The area on the right lower leg just above the dorsal ankle has reopened and the areas on the right foot between the first second and second third toes and scaling on the bottom of the foot has been about the same for quite some time now. been using silver alginate to all wound areas 2/7; left buttock wound looked quite good although not much smaller in terms of surface area surrounding skin looks better. Only [name] few dry flaking areas on the right foot in between the first and second toes the skin generally looks better here [ammonium lactate]. Finally the area on the right dorsal ankle is closed 2/21; ooThere is no open area on the right foot even between the right first and second toe. Skin around this area dorsally and plantar aspects look better. ooHe has [name] reopening of the area on the right ankle just above the  crease of the ankle dorsally. I continue to think that this is probably friction from spasms may be even this time with his stocking under the compression stockings. ooWounds on his left buttock look about the same there [name] couple of areas that have reopened. He has [name] total square area of loss of epithelialization. This does not look like infection it looks like [name] contact dermatitis but I just cannot determine to what 3/14; there is nothing on the right foot between the first and second toes this was carefully inspected under illumination. Some chronic irritation on the dorsal part of his foot from toes 1-3 at the base. Nothing really open here substantially. Still has an area on the right foot/ankle that is actually larger and hyper granulated. His buttock area on the left is just about closed however he has chronic inflammation with loss of the surface epithelial layer 3/28; 2-week follow-up. In clinic today with [name] new wound on the left anterior mid tibia. Says this happened about 2 weeks ago. He is not really sure how wonders about the spasticity of his legs at night whether that could have caused this other than that he does not have [name] good idea. He has been using topical antibiotics and silver alginate. The area on his right dorsal ankle seems somewhat better. ooFinally everything on his left buttock is closed. 4/11; 2-week follow-up. All of his wounds are better except for the area over the ischium and left buttock which have opened up widely again. At least part of this is covered in necrotic fibrinous material another part had rolled nonviable skin. The area on the right ankle, left anterior mid tibia are both [name] lot better. He had no open wounds on either foot including the areas between the first and second toes 4/25; patient presents for 2-week follow-up. He states that the wounds are overall stable. He has no complaints today and states he is using Hydrofera Blue to open wounds. 5/9;  have not seen this man in over [name] month. For my memory he has open areas on the left mid tibia and right ankle. T oday he has new open area on the right dorsal foot which we have not had [name] problem with recently. He has the sustained area on the left buttock He is also changed his insurance at the beginning of the year [employer]. We will need prior authorizations for debridement 5/23; patient presents for  2-week follow-up. He has prior authorizations for debridement. He denies any issues in the past 2 weeks with his wound care. He has been using Hydrofera Blue to all the wounds. He does report [name] circular rash to the upper left leg that is new. He denies acute signs of infection. 6/6; 2-week follow-up. The patient has open wounds on the left buttock which are worse than the last time I saw this about [name] month ago. He also has [name] new area to me on the left anterior mid tibia with some surrounding erythema. The area on the dorsal ankle on the right is closed but I think this will be [name] friction injury every time this area is exposed to either our wraps or his compression stockings caused by unrelenting spasms in this leg. 6/20; 2-week follow-up. oo The patient has open wounds on the left buttock which is about the same. Using [facility] here. - The left mid tibia has [name] static amount of surrounding erythema. Also [name] raised area in the center. We have been using Hydrofera Blue here. ooooFinally he has broken down in his dorsal right foot extending between the first and second toes and going to the base of the first and second toe webspace. I have previously assumed that this was severe venous hypertension 7/5; 2-week follow-up oo The left buttock wound actually looks better. We are using Hydrofera Blue. He has extensive skin irritation around this area and I have not really been able to get that any better. I have tried Lotrisone i.e. antifungals and steroids. More most recently we have just been  using Coloplast really looks about the same. oo The left mid tibia which was new last week culture to have very resistant staph aureus. Not only methicillin-resistant but doxycycline resistant. The patient has [name] plethora of antibiotic allergies including sulfa, linezolid. I used topical bacitracin on this but he has not started this yet. oo In addition he has an expanding area of erythema with [name] wound on the dorsal right foot. I did [name] deep tissue culture of this area today 7/12; oo Left buttock area actually looks better surrounding skin also looks less irritated. oo Left mid tibia looks about the same. He is using bacitracin this is not worse oo Right dorsal foot looks about the same as well. oo The left first toe also looks about the same 7/19; left buttock wound continues to improve in terms of open areas oo Left mid tibia is still concerning amount of swelling he is using bacitracin oo Dorsal left first toe somewhat smaller oo Right dorsal foot somewhat smaller 7/25; left buttock wound actually continues to improve oo Left mid tibia area has less swelling. I gave him all my samples of new Nuzyra. This seems to have helped although the wound is still open it. His abrasion closed by here oo Left dorsal great toe really no better. Still [name] very nonviable surface oo Right dorsal foot perhaps some better. oo We have been using bacitracin and silver nitrate to the areas on his lower legs and Hydrofera Blue to the area on the buttock. 8/16 oo Disappointed that his left buttock wound is actually more substantial. Apparently during the last nurse visit these were both very small. He has continued irritation to [name] large area of skin on his buttock. I have never been able to totally explain this although I think it some combination of the way he sits, pressure, moisture. He is not incontinent enough to contribute to this.   oo Left dorsal great toe still fibrinous debris on the surface  that I have debrided today oo Large area across the dorsal right toes. oo The area on the left anterior mid tibia has less swelling. He completed the [place]. This does not look infected although the tissue is still fried 8/30; 2-week follow-up. oo Left buttock areas not improved. We used Hydrofera Blue on this. Weeping wet with the surrounding erythema that I have not been able to control even with Lotrisone and topical Coloplast oo Left dorsal great toe looks about the same oo More substantial area again at the base of his toes on the left which is new this week. oo Area across the dorsal right toes looks improved oo The left anterior mid tibia looks like it is trying to close 9/13; 2-week follow-up. Using silver alginate on all of his wounds. The left dorsal foot does not look any better. He has the area on the dorsal toe and also the areas at the base of all of his toes 1 through 3. On the right foot he has [name] similar pattern in [name] similar area. He has the area on his left mid tibia that looks fairly healthy. Finally the area on his left buttock looks somewhat bette 9/20; culture I did of the left foot which was [name] deep tissue culture last time showed E. coli he has erythema around this wound. Still [name] completely necrotic surface. His right dorsal foot looks about the same. He has [name] very friable surface to the left anterior mid tibia. Both buttock wounds look better. We have been using silver alginate to all wounds 10/4; he has completed the cephalexin that I gave him last time for the left foot. He is using topical gentamicin under silver alginate silver alginate being applied to all the wounds. Unfortunately all the wounds look irritated on his dorsal right foot dorsal left foot left mid tibia. I wonder if this could be [name] silver allergy. I am going to change him to [facility] on the lower extremity. The skin on the left buttock and left posterior thigh still flaking dry and irritated.  This has continued no matter what I have applied topically to this. He has [name] solitary open wound which by itself does not look too bad however the entire area of surrounding skin does not change no matter what we have applied here Objective Constitutional Sitting or standing Blood Pressure is within target range for patient.. Pulse regular and within target range for patient.Marland Kitchen Respirations regular, non-labored and within target range.. Temperature is normal and within the target range for the patient.Marland Kitchen Appears in no distress. Vitals Time Taken: 8:06 AM, Height: 70 in, Weight: 216 lbs, BMI: 31, Temperature: 98.1 F, Pulse: 97 bpm, Respiratory Rate: 18 breaths/min, Blood Pressure: 125/76 mmHg. General Notes: Wound exam oo Right dorsal foot completely necrotic surface across the base of his second through fourth toes. Very similar area on the left foot just proximal to the second toe. Both required debridement with [name] #5 curette. Hemostasis with silver nitrate and direct pressure. Erythema around both wounds oo On the left mid tibia the same friable surface with surrounding erythema although the wound itself appears smaller oo Buttock wound looks by itself not too bad however he has the same large area of erythema some form of contact dermatitis would be my best guess. Integumentary (Hair, Skin) Wound #41R status is Open. Original cause of wound was Gradually Appeared. The date acquired was: 03/16/2020. The wound  has been in treatment 75 weeks. The wound is located on the Left Ischium. The wound measures 0.5cm length x 0.5cm width x 0.1cm depth; 0.196cm^2 area and 0.02cm^3 volume. There is Fat Layer (Subcutaneous Tissue) exposed. There is no tunneling or undermining noted. There is [name] medium amount of sanguinous drainage noted. The wound margin is distinct with the outline attached to the wound base. There is large (67-100%) red, friable granulation within the wound bed. There is no necrotic tissue  within the wound bed. Wound #51 status is Open. Original cause of wound was Trauma. The date acquired was: 01/26/2021. The wound has been in treatment 27 weeks. The wound is located on the Left,Anterior Lower Leg. The wound measures 1.4cm length x 1.6cm width x 0.1cm depth; 1.759cm^2 area and 0.176cm^3 volume. There is Fat Layer (Subcutaneous Tissue) exposed. There is no tunneling or undermining noted. There is [name] medium amount of serosanguineous drainage noted. The wound margin is distinct with the outline attached to the wound base. There is large (67-100%) red, friable, hyper - granulation within the wound bed. There is no necrotic tissue within the wound bed. General Notes: periwound erythema Wound #52 status is Open. Original cause of wound was Gradually Appeared. The date acquired was: 03/27/2021. The wound has been in treatment 21 weeks. The wound is located on the Right,Dorsal Foot. The wound measures 3cm length x 4cm width x 0.1cm depth; 9.425cm^2 area and 0.942cm^3 volume. There is Fat Layer (Subcutaneous Tissue) exposed. There is no tunneling or undermining noted. There is [name] medium amount of purulent drainage noted. The wound margin is distinct with the outline attached to the wound base. There is medium (34-66%) pink granulation within the wound bed. There is [name] medium (34-66%) amount of necrotic tissue within the wound bed including Adherent Slough. General Notes: maceration and periwound redness Wound #54 status is Open. Original cause of wound was Pressure Injury. The date acquired was: 05/11/2021. The wound has been in treatment 15 weeks. The wound is located on the Left,Distal Ischium. The wound measures 0.8cm length x 0.5cm width x 0.1cm depth; 0.314cm^2 area and 0.031cm^3 volume. There is Fat Layer (Subcutaneous Tissue) exposed. There is no tunneling or undermining noted. There is [name] medium amount of sanguinous drainage noted. The wound margin is distinct with the outline attached to the  wound base. There is large (67-100%) red, friable granulation within the wound bed. There is no necrotic tissue within the wound bed. Wound #55 status is Open. Original cause of wound was Blister. The date acquired was: 05/26/2021. The wound has been in treatment 13 weeks. The wound is located on the Left T Great. The wound measures 0.6cm length x 1.2cm width x 0.2cm depth; 0.565cm^2 area and 0.113cm^3 volume. There is Fat Layer oe (Subcutaneous Tissue) exposed. There is no tunneling or undermining noted. There is [name] medium amount of purulent drainage noted. The wound margin is distinct with the outline attached to the wound base. There is medium (34-66%) pink granulation within the wound bed. There is [name] medium (34-66%) amount of necrotic tissue within the wound bed including Adherent Slough. General Notes: periwound erythema Wound #56 status is Open. Original cause of wound was Gradually Appeared. The date acquired was: 07/11/2021. The wound has been in treatment 5 weeks. The wound is located on the Left,Dorsal Foot. The wound measures 3cm length x 3.7cm width x 0.1cm depth; 8.718cm^2 area and 0.872cm^3 volume. There is Fat Layer (Subcutaneous Tissue) exposed. There is no tunneling or undermining noted.  There is [name] medium amount of purulent drainage noted. The wound margin is distinct with the outline attached to the wound base. There is small (1-33%) pink granulation within the wound bed. There is [name] large (67-100%) amount of necrotic tissue within the wound bed including Adherent Slough. General Notes: maceration and periwound erythema Wound #57 status is Open. Original cause of wound was Gradually Appeared. The date acquired was: 08/25/2021. The wound is located on the [place]. The wound measures 1.3cm length x 1cm width x 0.1cm depth; 1.021cm^2 area and 0.102cm^3 volume. There is Fat Layer (Subcutaneous Tissue) exposed. There is no tunneling or undermining noted. There is [name] medium amount of  purulent drainage noted. The wound margin is distinct with the outline attached to the wound base. There is medium (34-66%) red granulation within the wound bed. There is [name] medium (34-66%) amount of necrotic tissue within the wound bed including Adherent Slough. 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 There was [name] Excisional Skin/Subcutaneous Tissue Debridement with [name] total area of 12 sq cm performed by Ricard Dillon., MD. With the following instrument(s): Curette to remove Non-Viable tissue/material. Material removed includes Subcutaneous Tissue and Slough and. No specimens were taken. [name] time out was conducted at 08:34, prior to the start of the procedure. [name] Moderate amount of bleeding was controlled with Silver Nitrate. The procedure was tolerated well. Post Debridement Measurements: 3cm length x 4cm width x 0.1cm depth; 0.942cm^3 volume. Character of Wound/Ulcer Post Debridement is stable. 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 There was [name] Excisional Skin/Subcutaneous Tissue Debridement with [name] total area of 11.1 sq cm performed by Ricard Dillon., MD. With the following instrument(s): Curette to remove Non-Viable tissue/material. Material removed includes Subcutaneous Tissue and Slough and. No specimens were taken. [name] time out was conducted at  08:34, prior to the start of the procedure. [name] Moderate amount of bleeding was controlled with Silver Nitrate. The procedure was tolerated well. Post Debridement Measurements: 3cm length x 3.7cm width x 0.1cm depth; 0.872cm^3 volume. Character of Wound/Ulcer Post Debridement is stable. Post procedure Diagnosis Wound #56: Same as Pre-Procedure Plan Follow-up Appointments: Return Appointment in 2 weeks. - with Dr. Dellia Nims Bathing/ Shower/ Hygiene: May shower and wash wound with soap and water. - on days that dressing is changed Edema Control - Lymphedema / SCD / Other: Elevate legs to the level of the heart or above for 30 minutes daily and/or when sitting, [name] frequency of: - throughout the day Compression stocking or Garment 30-40 mm/Hg pressure to: - Juxtalite to both legs daily Off-Loading: Roho cushion for wheelchair Turn and reposition every 2 hours WOUND #41R: - Ischium Wound Laterality: Left Cleanser: Soap and Water Every Other Day/30 Days Discharge Instructions: May shower and wash wound with dial antibacterial soap and water prior to dressing change. Secondary Dressing: ABD Pad, 5x9 Every Other Day/30 Days Discharge Instructions: Apply over primary dressing as directed. Secured With:  36M Medipore H Soft Cloth Surgical T 4 x 2 (in/yd) Every Other Day/30 Days ape Discharge Instructions: Secure dressing with tape as directed. WOUND #51: - Lower Leg Wound Laterality: Left, Anterior Cleanser: Soap and Water Every Other Day/30 Days Discharge Instructions: May shower and wash wound with dial antibacterial soap and water prior to dressing change. Prim Dressing: Hydrofera Blue Ready Foam, 4x5 in Every Other Day/30 Days ary Discharge Instructions: Apply to wound bed as instructed Secondary Dressing: ComfortFoam Border, 4x4 in (silicone border) Every Other Day/30 Days Discharge Instructions: Apply over primary dressing as directed. WOUND #52: - Foot Wound Laterality: Dorsal, Right Cleanser:  Soap and Water Every Other Day/30 Days Discharge Instructions: May shower and wash wound with dial antibacterial soap and water prior to dressing change. Topical: Gentamicin Every Other Day/30 Days Discharge Instructions: As directed by physician Prim Dressing: Hydrofera Blue Ready Foam, 2.5 x2.5 in Every Other Day/30 Days ary Discharge Instructions: Apply to wound bed as instructed Secondary Dressing: Woven Gauze Sponge, Non-Sterile 4x4 in Every Other Day/30 Days Discharge Instructions: Apply over primary dressing as directed. Secured With: [employer], 4.5x3.1 (in/yd) Every Other Day/30 Days Discharge Instructions: Secure with Kerlix as directed. Secured With: Transpore Surgical T ape, 2x10 (in/yd) Every Other Day/30 Days Discharge Instructions: Secure dressing with tape as directed. WOUND #54: - Ischium Wound Laterality: Left, Distal Cleanser: Soap and Water Every Other Day/30 Days Discharge Instructions: May shower and wash wound with dial antibacterial soap and water prior to dressing change. Secondary Dressing: ABD Pad, 5x9 Every Other Day/30 Days Discharge Instructions: Apply over primary dressing as directed. Secondary Dressing: ComfortFoam Border, 4x4 in (silicone border) Every Other Day/30 Days Discharge Instructions: Apply over primary dressing as directed. Secured With: 36M Medipore H Soft Cloth Surgical T 4 x 2 (in/yd) Every Other Day/30 Days ape Discharge Instructions: Secure dressing with tape as directed. WOUND #55: - T Great Wound Laterality: Left oe Cleanser: Soap and Water Every Other Day/30 Days Discharge Instructions: May shower and wash wound with dial antibacterial soap and water prior to dressing change. Prim Dressing: Hydrofera Blue Ready Foam, 2.5 x2.5 in Every Other Day/30 Days ary Discharge Instructions: Apply to wound bed as instructed Secondary Dressing: Woven Gauze Sponge, Non-Sterile 4x4 in Every Other Day/30 Days Discharge Instructions: Apply  over primary dressing as directed. Secured With: [employer], 4.5x3.1 (in/yd) Every Other Day/30 Days Discharge Instructions: Secure with Kerlix as directed. Secured With: Transpore Surgical T ape, 2x10 (in/yd) Every Other Day/30 Days Discharge Instructions: Secure dressing with tape as directed. WOUND #56: - Foot Wound Laterality: Dorsal, Left Cleanser: Soap and Water Every Other Day/30 Days Discharge Instructions: May shower and wash wound with dial antibacterial soap and water prior to dressing change. Topical: Gentamicin Every Other Day/30 Days Discharge Instructions: Apply directly to wound bed, under alginate Prim Dressing: Hydrofera Blue Classic Foam, 4x4 in Every Other Day/30 Days ary Discharge Instructions: Moisten with saline prior to applying to wound bed Secondary Dressing: Woven Gauze Sponge, Non-Sterile 4x4 in Every Other Day/30 Days Discharge Instructions: Apply over primary dressing as directed. Secured With: [employer], 4.5x3.1 (in/yd) Every Other Day/30 Days Discharge Instructions: Secure with Kerlix as directed. Secured With: Transpore Surgical T ape, 2x10 (in/yd) Every Other Day/30 Days Discharge Instructions: Secure dressing with tape as directed. WOUND #57: - Foot Wound Laterality: Plantar, Right Cleanser: Soap and Water Every Other Day/30 Days Discharge Instructions: May shower and wash wound with dial antibacterial soap and water prior to dressing change. Prim  Dressing: Hydrofera Blue Ready Foam, 2.5 x2.5 in Every Other Day/30 Days ary Discharge Instructions: Apply to wound bed as instructed Secondary Dressing: Woven Gauze Sponge, Non-Sterile 4x4 in Every Other Day/30 Days Discharge Instructions: Apply over primary dressing as directed. Secured With: [employer], 4.5x3.1 (in/yd) Every Other Day/30 Days Discharge Instructions: Secure with Kerlix as directed. Secured With: 57M Medipore H Soft Cloth Surgical T 4 x 2 (in/yd) Every Other Day/30  Days ape Discharge Instructions: Secure dressing with tape as directed. 1. I change the primary dressings on the legs including the right foot left foot and left mid tibia to [facility] wondering whether some of this could be [name] silver alginate allergy. 2. I am continue to use gentamicin topically on the wounds on his feet in response to gram-negative's I cultured from the left foot/deep tissue culture 3. On the buttock I am simply going to put an ABD pad and see if the skin will respond Electronic Signature(s) Signed: 08/26/2021 10:14:24 AM By: Linton Ham MD Entered By: Linton Ham on 08/25/2021 09:07:03 -------------------------------------------------------------------------------- SuperBill Details Patient Name: Date of Service: [name], [name] 08/25/2021 Medical Record Number: [id number] Patient Account Number: [id number] Date of Birth/Sex: Treating RN: Jan 10, 1986 (35 y.o. [name] Primary Care Provider: [place], [place] Other Clinician: Referring Provider: Treating Provider/Extender: [name]: 294 Diagnosis Coding ICD-10 Codes Code Description I87.332 Chronic venous hypertension (idiopathic) with ulcer and inflammation of left lower extremity L97.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, stage 3 L97.821 Non-pressure chronic ulcer of other part of left lower leg limited to breakdown of skin L97.521 Non-pressure chronic ulcer of other part of left foot limited to breakdown of skin G82.21 Paraplegia, complete Facility Procedures CPT4 Code: [id number] Description: 23536 - DEB SUBQ TISSUE 20 SQ CM/< ICD-10 Diagnosis Description L97.511 Non-pressure chronic ulcer of other part of right foot limited to breakdown of L97.521 Non-pressure chronic ulcer of other part of left foot limited to breakdown of Modifier: skin skin Quantity: 1 CPT4 Code:  [id number] IC L L Description: 08676 - DEB SUBQ TISS EA ADDL 20CM D-10 Diagnosis Description 97.511 Non-pressure chronic ulcer of other part of right foot limited to breakdown of skin 97.521 Non-pressure chronic ulcer of other part of left foot limited to breakdown of  skin Modifier: Quantity: 1 Physician Procedures : CPT4 Code Description Modifier [id number] 11042 - WC PHYS SUBQ TISS 20 SQ CM ICD-10 Diagnosis Description L97.511 Non-pressure chronic ulcer of other part of right foot limited to breakdown of skin L97.521 Non-pressure chronic ulcer of other part of left  foot limited to breakdown of skin Quantity: 1 : [id number] 11045 - WC PHYS SUBQ TISS EA ADDL 20 CM ICD-10 Diagnosis Description L97.511 Non-pressure chronic ulcer of other part of right foot limited to breakdown of skin L97.521 Non-pressure chronic ulcer of other part of left foot limited to breakdown  of skin Quantity: 1 Electronic Signature(s) Signed: 08/26/2021 10:14:24 AM By: Linton Ham MD Entered By: Linton Ham on 08/25/2021 09:07:16

## 2021-09-05 ENCOUNTER — Telehealth: Payer: Self-pay | Admitting: Psychiatry

## 2021-09-05 DIAGNOSIS — F341 Dysthymic disorder: Secondary | ICD-10-CM

## 2021-09-07 NOTE — Telephone Encounter (Signed)
Needs apt with a new provider, last visit was 08/2020 with Dr. Creig Hines

## 2021-09-08 ENCOUNTER — Encounter (HOSPITAL_BASED_OUTPATIENT_CLINIC_OR_DEPARTMENT_OTHER): Payer: BC Managed Care – PPO | Admitting: Internal Medicine

## 2021-09-08 ENCOUNTER — Other Ambulatory Visit: Payer: Self-pay

## 2021-09-08 DIAGNOSIS — L89323 Pressure ulcer of left buttock, stage 3: Secondary | ICD-10-CM | POA: Diagnosis not present

## 2021-09-08 DIAGNOSIS — L97521 Non-pressure chronic ulcer of other part of left foot limited to breakdown of skin: Secondary | ICD-10-CM | POA: Diagnosis not present

## 2021-09-08 DIAGNOSIS — G8221 Paraplegia, complete: Secondary | ICD-10-CM | POA: Diagnosis not present

## 2021-09-08 DIAGNOSIS — I87332 Chronic venous hypertension (idiopathic) with ulcer and inflammation of left lower extremity: Secondary | ICD-10-CM | POA: Diagnosis not present

## 2021-09-08 DIAGNOSIS — L97522 Non-pressure chronic ulcer of other part of left foot with fat layer exposed: Secondary | ICD-10-CM | POA: Diagnosis not present

## 2021-09-08 DIAGNOSIS — L97821 Non-pressure chronic ulcer of other part of left lower leg limited to breakdown of skin: Secondary | ICD-10-CM | POA: Diagnosis not present

## 2021-09-08 DIAGNOSIS — L97519 Non-pressure chronic ulcer of other part of right foot with unspecified severity: Secondary | ICD-10-CM | POA: Diagnosis not present

## 2021-09-08 DIAGNOSIS — L98412 Non-pressure chronic ulcer of buttock with fat layer exposed: Secondary | ICD-10-CM | POA: Diagnosis not present

## 2021-09-08 DIAGNOSIS — L97512 Non-pressure chronic ulcer of other part of right foot with fat layer exposed: Secondary | ICD-10-CM | POA: Diagnosis not present

## 2021-09-08 DIAGNOSIS — L97511 Non-pressure chronic ulcer of other part of right foot limited to breakdown of skin: Secondary | ICD-10-CM | POA: Diagnosis not present

## 2021-09-08 DIAGNOSIS — L97822 Non-pressure chronic ulcer of other part of left lower leg with fat layer exposed: Secondary | ICD-10-CM | POA: Diagnosis not present

## 2021-09-08 NOTE — Progress Notes (Signed)
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 aggressively for this although we have not made any improvement here. 08/11/17; left lateral leg. Surface continues to improve with the Iodoflex although we are not seeing much improvement in overall wound dimensions. Areas on his plantar left foot and right foot show no improvement. In fact the right foot looks somewhat worse 08/18/17; left lateral leg. We changed to [facility] Blue last week after a prolonged course of Iodoflex which helps get the surface better. It appears that the wound with is improved. Continue with difficult areas on the left dorsal first second and plantar first second on the right 09/01/17; patient  arrives in clinic today having had a temperature of 103 yesterday. He was seen in the ER and [facility]. The patient was concerned he could have cellulitis again in the right leg however they diagnosed him with a UTI and he is now on Keflex. He has a history of cellulitis which is been recurrent and difficult but this is been in the left leg, in the past 5 use doxycycline. He does in and out catheterizations at home which are risk factors for UTI 09/08/17; patient will be completing his Keflex this weekend. The erythema on the left leg is considerably better. He has a new wound today on the medial  part of the right leg small superficial almost looks like a skin tear. He has worsening of the area on the right dorsal first and second toe. His major area on the left lateral leg is better. Using Hydrofera Blue on all areas 09/15/17; gradual reduction in width on the long wound in the left lateral leg. No debridement required. He also has wounds on the plantar aspect of his left first second toe web space and on the dorsal aspect of the right first second toe web space. 09/22/17; there continues to be very gradual improvements in the dimensions of the left lateral leg wound. He hasn't round erythematous spot with might be pressure on his wheelchair. There is no evidence obviously of infection no purulence no warmth He has a dry scaled area on the plantar aspect of the left first second toe Improved area on the dorsal right first second toe. 09/29/17; left lateral leg wound continues to improve in dimensions mostly with an is still a fairly long but increasingly narrow wound. He has a dry scaled area on the plantar aspect of his left first second toe web space Increasingly concerning area on the dorsal right first second toe. In fact I am concerned today about possible cellulitis around this wound. The areas extending up his second toe and although there is deformities here almost appears to abut on the nailbed. 10/06/17; left lateral leg wound continues to make very gradual progress. Tissue culture I did from the right first second toe dorsal foot last time grew MRSA and enterococcus which was vancomycin sensitive. This was not sensitive to clindamycin or doxycycline. He is allergic to Zyvox and sulfa we have therefore arrange for him to have dalvance infusion tomorrow. He is had this in the past and tolerated it well 10/20/17; left lateral leg wound continues to make decent progress. This is certainly reduced in terms of with there is advancing epithelialization.The cellulitis in the right foot  looks better although he still has a deep wound in the dorsal aspect of the first second toe web space. Plantar left first toe web space on the left I think is making some progress 10/27/17; left lateral leg wound continues to make decent progress. Advancing epithelialization.using Hydrofera Blue The right first second toe web space wound is better-looking using silver alginate Improvement in the left plantar first second toe web space. Again using silver alginate 11/03/17 left lateral leg wound continues to make decent progress albeit slowly. Using [facility] The right per second toe web space continues to be a very problematic looking punched out wound. I obtained a piece of tissue for deep culture I did extensively treated this for fungus. It is difficult to imagine that this is a pressure area as the patient states other than going outside he doesn't really wear shoes at home The left plantar  first second toe web space looked fairly senescent. Necrotic edges. This required debridement change to [facility] Blue to all wound areas 11/10/17; left lateral leg wound continues to contract. Using Hydrofera Blue On the right dorsal first second toe web space dorsally. Culture I did of this area last week grew MRSA there is not an easy oral option in this patient was multiple antibiotic allergies or intolerances. This was only a rare culture isolate I'm therefore going to use Bactroban under silver alginate On the left plantar first second toe web space. Debridement is required here. This is also unchanged 11/17/17; left lateral leg wound continues to contract using Hydrofera Blue this is no longer the major issue. The major concern here is the right first second toe web space. He now has an open area going from dorsally to the plantar aspect. There is now wound on the inner lateral part of the first toe. Not a very viable surface on this. There is erythema spreading medially into the forefoot. No  major change in the left first second toe plantar wound 11/24/17; left lateral leg wound continues to contract using Hydrofera Blue. Nice improvement today The right first second toe web space all of this looks a lot less angry than last week. I have given him clindamycin and topical Bactroban for MRSA and terbinafine for the possibility of underlining tinea pedis that I could not control with ketoconazole. Looks somewhat better The area on the plantar left first second toe web space is weeping with dried debris around the wound 12/01/17; left lateral leg wound continues to contract he Hydrofera Blue. It is becoming thinner in terms of with nevertheless it is making good improvement. The right first second toe web space looks less angry but still a large necrotic-looking wounds starting on the plantar aspect of the right foot extending between the toes and now extensively on the base of the right second toe. I gave him clindamycin and topical Bactroban for MRSA anterior benefiting for the possibility of underlying tinea pedis. Not looking better today The area on the left first/second toe looks better. Debrided of necrotic debris 12/05/17* the patient was worked in urgently today because over the weekend he found blood on his incontinence bad when he woke up. He was found to have an ulcer by his wife who does most of his wound care. He came in today for [place] to look at this. He has not had a history of wounds in his buttocks in spite of his paraplegia. 12/08/17; seen in follow-up today at his usual appointment. He was seen earlier this week and found to have a new wound on his buttock. We also follow him for wounds on the left lateral leg, left first second toe web space and right first second toe web space 12/15/17; we have been using Hydrofera Blue to the left lateral leg which has improved. The right first second toe web space has also improved. Left first second toe web space plantar aspect looks stable.  The left buttock has worsened using Santyl. Apparently the buttock has drainage 12/22/17; we have been using Hydrofera Blue to the left lateral leg which continues to improve now 2 small wounds separated by normal skin. He tells [place] he had a fever up to 100 yesterday he is prone to UTIs but has not noted anything different. He does in and out catheterizations. The area between the first and second toes today does not look good necrotic surface covered with what looks to be  purulent drainage and erythema extending into the third toe. I had gotten this to something that I thought look better last time however it is not look good today. He also has a necrotic surface over the buttock wound which is expanded. I thought there might be infection under here so I removed a lot of the surface with a #5 curet though nothing look like it really needed culturing. He is been using Santyl to this area 12/27/17; his original wound on the left lateral leg continues to improve using Hydrofera Blue. I gave him samples of Baxdella although he was unable to take them out of fear for an allergic reaction ["lump in his throat"].the culture I did of the purulent drainage from his second toe last week showed both enterococcus and a set Enterobacter I was also concerned about the erythema on the bottom of his foot although paradoxically although this looks somewhat better today. 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 We've been using silver alginate here. They came up today that he sleeps in a recliner, I'm not sure why but I asked him to stop this 01/03/18; his original wound we've been using Hydrofera Blue is now separated into 2 areas. Ulcer on his left buttock is better he is off the recliner and sleeping in bed Finally both wound areas between his first and second toes also looks some better 01/10/18; his original wound on the  left lateral leg is now separated into 2 wounds we've been using Hydrofera Blue Ulcer on his left buttock has some drainage. There is a small probing site going into muscle layer superiorly.using silver alginate -He arrives today with a deep tissue injury on the left heel The wound on the dorsal aspect of his first second toe on the left looks a lot betterusing silver alginate ketoconazole The area on the first second toe web space on the right also looks a lot bette 01/17/18; his original wound on the left lateral leg continues to progress using Hydrofera Blue Ulcer on his left buttock also is smaller surface healthier except for a small probing site going into the muscle layer superiorly. 2.4 cm of tunneling in this area DTI on his left heel we have only been offloading. Looks better than last week no threatened open no evidence of infection the wound on the dorsal aspect of the first second toe on the left continues to look like it's regressing we have only been using silver alginate and terbinafine orally The area in the first second toe web space on the right also looks to be a lot better using silver alginate and terbinafine I think this was prompted by tinea pedis 01/31/18; the patient was hospitalized in [place] last week apparently for a complicated UTI. He was discharged on cefepime he does in and out catheterizations. In the hospital he was discovered M I don't mild elevation of AST and ALT and the terbinafine was stopped.predictably the pressure ulcer on s his buttock looks betterusing silver alginate. The area on the left lateral leg also is better using Hydrofera Blue. The area between the first and second toes on the left better. First and second toes on the right still substantial but better. Finally the DTI on the left heel has held together and looks like it's resolving 02/07/18-he is here in follow-up evaluation for multiple ulcerations. He has new injury to the lateral aspect of  the last  issue a pressure ulcer, he states this is from adhesive removal trauma. He states he has tried multiple adhesive products with no success. All other ulcers appear stable. The left heel DTI is resolving. We will continue with same treatment plan and follow-up next week. 02/14/18; follow-up for multiple areas. He has a new area last week on the lateral aspect of his pressure ulcer more over the posterior trochanter. The original pressure ulcer looks quite stable has healthy granulation. 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 Almost fully epithelialized on the original superior area using Hydrofera Blue DTI on the left heel has peeled off this week to reveal a small superficial wound under denuded skin and subcutaneous tissue Both areas between the first and second toes look better including nothing open on the left 02/21/18; The patient's wounds on his left ischial tuberosity and posterior left greater trochanter actually looked better. He has a large area of irritation around the area which I think is contact dermatitis. I am doubtful that this is fungal His original wound on the left lateral calf continues to improve we have been using Hydrofera Blue There is no open area in the left first second toe web space although there is a lot of thick callus The DTI on the left heel required debridement today of necrotic surface eschar and subcutaneous tissue using silver alginate Finally the area on the right first second toe webspace continues to contract using silver alginate and ketoconazole 02/28/18 Left ischial tuberosity wounds look better using silver alginate. Original wound on the left calf only has one small open area left using Hydrofera Blue DTI on the left heel required debridement mostly removing skin from around this wound surface. Using silver alginate The areas on the right  first/second toe web space using silver alginate and ketoconazole 03/08/18 on evaluation today patient appears to be doing decently well as best I can tell in regard to his wounds. This is the first time that I have seen him as he generally is followed by Dr. Dellia Nims. With that being said none of his wounds appear to be infected he does have an area where there is some skin covering what appears to be a new wound on the left dorsal surface of his great toe. This is right at the nail bed. With that being said I do believe that debrided away some of the excess skin can be of benefit in this regard. Otherwise he has been tolerating the dressing changes without complication. 03/14/18; patient arrives today with the multiplicity of wounds that we are following. He has not been systemically unwell Original wound on the left lateral calf now only has 2 small open areas we've been using Hydrofera Blue which should continue The deep tissue injury on the left heel requires debridement today. We've been using silver alginate The left first second toe and the right first second toe are both are reminiscence what I think was tinea pedis. Apparently some of the callus Surface between the toes was removed last week when it started draining. Purulent drainage coming from the wound on the ischial tuberosity on the left. 03/21/18-He is here in follow-up evaluation for multiple wounds. 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 He tolerated debridement. The only change to last week's recommendations is to discontinue antifungal cream between toes. He will follow-up next week 03/28/18; following  up for multiple wounds;Concern this week is streaking redness and swelling in the right foot. He is going to need antibiotics for this. 03/31/18; follow-up for right foot cellulitis. Streaking redness and swelling in the right foot on 03/28/18. He has multiple  antibiotic intolerances and a history of MRSA. I put him on clindamycin 300 mg every 6 and brought him in for a quick check. He has an open wound between his first and second toes on the right foot as a potential source. 04/04/18; Right foot cellulitis is resolving he is completing clindamycin. This is truly good news Left lateral calf wound which is initial wound only has one small open area inferiorly this is close to healing out. He has compression stockings. We will use Hydrofera Blue right down to the epithelialization of this Nonviable surface on the left heel which was initially pressure with a DTI. We've been using Hydrofera Blue. I'm going to switch this back to silver alginate Left first second toe/tinea pedis this looks better using silver alginate Right first second toe tinea pedis using silver alginate Large pressure ulcers on theLeft ischial tuberosity. Small wound here Looks better. I am uncertain about the surface over the large wound. Using silver alginate 04/11/18; Cellulitis in the right foot is resolved Left lateral calf wound which was his original wounds still has 2 tiny open areas remaining this is just about closed Nonviable surface on the left heel is better but still requires debridement Left first second toe/tinea pedis still open using silver alginate Right first second toe wound tinea pedis I asked him to go back to using ketoconazole and silver alginate Large pressure ulcers on the left ischial tuberosity this shear injury here is resolved. Wound is smaller. No evidence of infection using silver alginate 04/18/18; Patient arrives with an intense area of cellulitis in the right mid lower calf extending into the right heel area. Bright red and warm. Smaller area on the left anterior leg. He has a significant history of MRSA. He will definitely need antibioticsdoxycycline He now has 2 open areas on the left ischial tuberosity the original large wound and now a satellite  area which I think was above his initial satellite areas. Not a wonderful surface on this satellite area surrounding erythema which looks like pressure related. His left lateral calf wound again his original wound is just about closed Left heel pressure injury still requiring debridement Left first second toe looks a lot better using silver alginate Right first second toe also using silver alginate and ketoconazole cream also looks better 04/20/18; the patient was worked in early today out of concerns with his cellulitis on the right leg. I had started him on doxycycline. This was 2 days ago. His wife was concerned about the swelling in the area. Also concerned about the left buttock. He has not been systemically unwell no fever chills. No nausea vomiting or diarrhea 04/25/18; the patient's left buttock wound is continued to deteriorate he is using Hydrofera Blue. He is still completing clindamycin for the cellulitis on the right leg although all of this looks better. 05/02/18 Left buttock wound still with a lot of drainage and a very tightly adherent fibrinous necrotic surface. He has a deeper area superiorly The left lateral calf wound is still closed DTI wound on the left heel necrotic surface especially the circumference using Iodoflex Areas between his left first second toe and right first second toe both look better. Dorsally and the right first second toe he had a  necrotic surface although at smaller. In using silver alginate and ketoconazole. I did a culture last week which was a deep tissue culture of the reminiscence of the open wound on the right first second toe dorsally. This grew a few Acinetobacter and a few methicillin-resistant staph aureus. Nevertheless the area actually this week looked better. I didn't feel the need to specifically address this at least in terms of systemic antibiotics. 05/09/18; wounds are measuring larger more drainage per our intake. We are using Santyl covered  with alginate on the large superficial buttock wounds, Iodosorb on the left heel, ketoconazole and silver alginate to the dorsal first and second toes bilaterally. 05/16/18; The area on his left buttock better in some aspects although the area superiorly over the ischial tuberosity required an extensive debridement.using Santyl Left heel appears stable. Using Iodoflex The areas between his first and second toes are not bad however there is spreading erythema up the dorsal aspect of his left foot this looks like cellulitis again. He is insensate the erythema is really very brilliant.o Erysipelas He went to see an allergist days ago because he was itching part of this he had lab work done. This showed a white count of 15.1 with 70% neutrophils. Hemoglobin of 11.4 and a platelet count of 659,000. Last white count we had in Epic was a 2-1/2 years ago which was 25.9 but he was ill at the time. He was able to show me some lab work that was done by his primary physician the pattern is about the same. 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 But prompted me to go ahead and do x-rays of both feet and the pelvis rule out osteomyelitis. He also had a comprehensive metabolic panel this was reasonably normal his albumin was 3.7 liver function tests BUN/creatinine all normal 05/23/18; x-rays of both his feet from last week were negative for underlying pulmonary abnormality. The x-ray of his pelvis however showed mild irregularity in the left ischial which may represent some early osteomyelitis. The wound in the left ischial continues to get deeper clearly now exposed muscle. Each week necrotic surface material over this area. Whereas the rest of the wounds do not look so bad. The left ischial wound we have been using Santyl and calcium alginate T the left heel surface necrotic debris using Iodoflex o The left lateral leg is still healed Areas on the left dorsal foot  and the right dorsal foot are about the same. There is some inflammation on the left which might represent contact dermatitis, fungal dermatitis I am doubtful cellulitis although this looks better than last week 05/30/18; CT scan done at Hospital did not show any osteomyelitis or abscess. Suggested the possibility of underlying cellulitis although I don't see a lot of evidence of this at the bedside The wound itself on the left buttock/upper thigh actually looks somewhat better. No debridement Left heel also looks better no debridement continue Iodoflex Both dorsal first second toe spaces appear better using Lotrisone. Left still required debridement 06/06/18; Intake reported some purulent looking drainage from the left gluteal wound. Using Santyl and calcium alginate Left heel looks better although still a nonviable surface requiring debridement The left dorsal foot first/second webspace actually expanding and somewhat deeper. I may consider doing a shave biopsy of this area Right dorsal foot first/second webspace appears stable to improved. Using Lotrisone and silver alginate to both these areas 06/13/18 Left gluteal surface looks better. Now  separated in the 2 wounds. No debridement required. Still drainage. We'll continue silver alginate Left heel continues to look better with Iodoflex continue this for at least another week Of his dorsal foot wounds the area on the left still has some depth although it looks better than last week. We've been using Lotrisone and silver alginate 06/20/18 Left gluteal continues to look better healthy tissue Left heel continues to look better healthy granulation wound is smaller. He is using Iodoflex and his long as this continues continue the Iodoflex Dorsal right foot looks better unfortunately dorsal left foot does not. There is swelling and erythema of his forefoot. He had minor trauma to this several days ago but doesn't think this was enough to have caused any  tissue injury. Foot looks like cellulitis, we have had this problem before 06/27/18 on evaluation today patient appears to be doing a little worse in regard to his foot ulcer. 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 Both of which would really be his only options for treating this infection. In the past he has been given and effusion of Orbactiv. This is done very well for him in the past again it's one time dosing IV antibiotic therapy. Subsequently I do believe this is something we're gonna need to see about doing at this point in time. Currently his other wounds seem to be doing somewhat better in my pinion I'm pretty happy in that regard. 07/03/18 on evaluation today patient's wounds actually appear to be doing fairly well. He has been tolerating the dressing changes without complication. All in all he seems to be showing signs of improvement. In regard to the antibiotics he has been dealing with infectious disease since I saw him last week as far as getting this scheduled. In the end [facility] this done this coming Friday. In the meantime he has been continuing to perform the dressing changes in such as previous. There does not appear to be any evidence of infection worsengin at this time. 07/10/18; Since I last saw this man 2 weeks ago things have actually improved. IV antibiotics of resulted in less forefoot erythema although there is still some present. He is not systemically unwell Left buttock wounds 2 now have no depth there is increased epithelialization Using silver alginate Left heel still requires debridement using Iodoflex Left dorsal foot still with a sizable wound about the size of a border but healthy granulation Right dorsal foot still with a slitlike area using silver alginate 07/18/18;  the patient's cellulitis in the left foot is improved in fact I think it is on its way to resolving. Left buttock wounds 2 both look better although the larger one has hypertension granulation we've been using silver alginate Left heel has some thick circumferential redundant skin over the wound edge which will need to be removed today we've been using Iodoflex Left dorsal foot is still a sizable wound required debridement using silver alginate The right dorsal foot is just about closed only a small open area remains here 07/25/18; left foot cellulitis is resolved Left buttock wounds 2 both look better. Hyper-granulation on the major area Left heel as some debris over the surface but otherwise looks a healthier wound. Using silver collagen Right dorsal foot is just about closed 07/31/18; arrives with our intake  nurse worried about purulent drainage from the buttock. We had hyper-granulation here last week His buttock wounds 2 continue to look better Left heel some debris over the surface but measuring smaller. Right dorsal foot unfortunately has openings between the toes Left foot superficial wound looks less aggravated. 08/07/18 Buttock wounds continue to look better although some of her granulation and the larger medial wound. silver alginate Left heel continues to look a lot better.silver collagen Left foot superficial wound looks less stable. Requires debridement. He has a new wound superficial area on the foot on the lateral dorsal foot. Right foot looks better using silver alginate without Lotrisone 08/14/2018; patient was in the ER last week diagnosed with a UTI. He is now on Cefpodoxime and Macrodantin. Buttock wounds continued to be smaller. Using silver alginate Left heel continues to look better using silver collagen Left foot superficial wound looks as though it is improving Right dorsal foot area is just about healed. 08/21/2018; patient is completed his antibiotics for his UTI. He has  2 open areas on the buttocks. There is still not closed although the surface looks satisfactory. Using silver alginate Left heel continues to improve using silver collagen The bilateral dorsal foot areas which are at the base of his first and second toes/possible tinea pedis are actually stable on the left but worse on the right. The area on the left required debridement of necrotic surface. After debridement I obtained a specimen for PCR culture. The right dorsal foot which is been just about healed last week is now reopened 08/28/2018; culture done on the left dorsal foot showed coag negative staph both staph epidermidis and Lugdunensis. I think this is worthwhile initiating systemic treatment. I will use doxycycline given his long list of allergies. The area on the left heel slightly improved but still requiring debridement. The large wound on the buttock is just about closed whereas the smaller one is larger. Using silver alginate in this area 09/04/2018; patient is completing his doxycycline for the left foot although this continues to be a very difficult wound area with very adherent necrotic debris. We are using silver alginate to all his wounds right foot left foot and the small wounds on his buttock, silver collagen on the left heel. 09/11/2018; once again this patient has intense erythema and swelling of the left forefoot. Lesser degrees of erythema in the right foot. He has a long list of allergies and intolerances. I will reinstitute doxycycline. 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 Using silver alginate Left heel also looks better using silver collagen Unfortunately both the areas on his feet look worse. The area on the left first second webspace is now gone through to the plantar part of his foot. The area on the left foot anteriorly is irritated with erythema and swelling in the forefoot. 09/25/2018 His wound on the left plantar heel  looks better. Using silver collagen The area on the left buttock 2 small remnant areas. One is closed one is still open. Using silver alginate The areas between both his first and second toes look worse. This in spite of long-standing antifungal therapy with ketoconazole and silver alginate which should have antifungal activity He has small areas around his original wound on the left calf one is on the bottom of the original scar tissue and one superiorly both of these are small and superficial but again given wound history in this site this is worrisome 10/02/2018  Left plantar heel continues to gradually contract using silver collagen Left buttock wound is unchanged using silver alginate The areas on his dorsal feet between his first and second toes bilaterally look about the same. I prescribed clindamycin ointment to see if we can address chronic staph colonization and also the underlying possibility of erythrasma The left lateral lower extremity wound is actually on the lateral part of his ankle. Small open area here. We have been using silver alginate 10/09/2018; Left plantar heel continues to look healthy and contract. No debridement is required Left buttock slightly smaller with a tape injury wound just below which was new this week Dorsal feet somewhat improved I have been using clindamycin Left lateral looks lower extremity the actual open area looks worse although a lot of this is epithelialized. I am going to change to silver collagen today He has a lot more swelling in the right leg although this is not pitting not red and not particularly warm there is a lot of spasm in the right leg usually indicative of people with paralysis of some underlying discomfort. We have reviewed his vascular status from 2017 he had a left greater saphenous vein ablation. I wonder about referring him back to vascular surgery if the area on the left leg continues to deteriorate. 10/16/2018 in today for  follow-up and management of multiple lower extremity ulcers. His left Buttock wound is much lower smaller and almost closed completely. The wound to the left ankle has began to reopen with Epithelialization and some adherent slough. He has multiple new areas to the left foot and leg. The left dorsal foot without much improvement. Wound present between left great webspace and 2nd toe. Erythema and edema present right leg. Right LE ultrasound obtained on 10/10/18 was negative for DVT . 10/23/2018; Left buttock is closed over. Still dry macerated skin but there is no open wound. I suspect this is chronic pressure/moisture Left lateral calf is quite a bit worse than when I saw this last. There is clearly drainage here he has macerated skin into the left plantar heel. We will change the primary dressing to alginate Left dorsal foot has some improvement in overall wound area. Still using clindamycin and silver alginate Right dorsal foot about the same as the left using clindamycin and silver alginate The erythema in the right leg has resolved. He is DVT rule out was negative Left heel pressure area required debridement although the wound is smaller and the surface is health 10/26/2018 The patient came back in for his nurse check today predominantly because of the drainage coming out of the left lateral leg with a recent reopening of his original wound on the left lateral calf. He comes in today with a large amount of surrounding erythema around the wound extending from the calf into the ankle and even in the area on the dorsal foot. He is not systemically unwell. He is not febrile. Nevertheless this looks like cellulitis. We have been using silver alginate to the area. I changed him to a regular visit and I am going to prescribe him doxycycline. The rationale here is a long list of medication intolerances and a history of MRSA. I did not see anything that I thought would provide a valuable culture  10/30/2018 Follow-up from his appointment 4 days ago with really an extensive area of cellulitis in the left calf left lateral ankle and left dorsal foot. I put him on doxycycline. He has a long list of medication allergies which are  true allergy reactions. 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 In any case he is a lot better today. The erythema especially in the anterior and lateral left calf is better. He still has left ankle erythema. He also is complaining about increasing edema in the right leg we have only been using Kerlix Coban and he has been doing the wraps at home. Finally he has a spotty rash on the medial part of his upper left calf which looks like folliculitis or perhaps wrap occlusion type injury. Small superficial macules not pustules 11/06/18 patient arrives today with again a considerable degree of erythema around the wound on the left lateral calf extending into the dorsal ankle and dorsal foot. This is a lot worse than when I saw this last week. He is on doxycycline really with not a lot of improvement. He has not been systemically unwell Wounds on the; left heel actually looks improved. Original area on the left foot and proximity to the first and second toes looks about the same. He has superficial areas on the dorsal foot, anterior calf and then the reopening of his original wound on the left lateral calf which looks about the same The only area he has on the right is the dorsal webspace first and second which is smaller. He has a large area of dry erythematous skin on the left buttock small open area here. 11/13/2018; the patient arrives in much better condition. The erythema around the wound on the left lateral calf is a lot better. Not sure whether this was the clindamycin or the TCA and ketoconazole or just in the improvement in edema control [stasis dermatitis]. In any case this is a lot better. The  area on the left heel is very small and just about resolved using silver collagen we have been using silver alginate to the areas on his dorsal feet 11/20/2018; his wounds include the left lateral calf, left heel, dorsal aspects of both feet just proximal to the first second webspace. He is stable to slightly improved. I did not think any changes to his dressings were going to be necessary 11/27/2018 he has a reopening on the left buttock which is surrounded by what looks like tinea or perhaps some other form of dermatitis. The area on the left dorsal foot has some erythema around it I have marked this area but I am not sure whether this is cellulitis or not. Left heel is not closed. Left calf the reopening is really slightly longer and probably worse 1/13; in general things look better and smaller except for the left dorsal foot. Area on the left heel is just about closed, left buttock looks better only a small wound remains in the skin looks better [using Lotrisone] 1/20; the area on the left heel only has a few remaining open areas here. Left lateral calf about the same in terms of size, left dorsal foot slightly larger right lateral foot still not closed. The area on the left buttock has no open wound and the surrounding skin looks a lot better 1/27; the area on the left heel is closed. Left lateral calf better but still requiring extensive debridements. The area on his left buttock is closed. He still has the open areas on the left dorsal foot which is slightly smaller in the right foot which is slightly expanded. We have been using Iodoflex on these areas as well 2/3; left heel is closed. Left  lateral calf still requiring debridement using Iodoflex there is no open area on his left buttock however he has dry scaly skin over a large area of this. Not really responding well to the Lotrisone. Finally the areas on his dorsal feet at the level of the first second webspace are slightly smaller on the  right and about the same on the left. Both of these vigorously debrided with Anasept and gauze 2/10; left heel remains closed he has dry erythematous skin over the left buttock but there is no open wound here. Left lateral leg has come in and with. Still requiring debridement we have been using Iodoflex here. Finally the area on the left dorsal foot and right dorsal foot are really about the same extremely dry callused fissured areas. He does not yet have a dermatology appointment 2/17; left heel remains closed. He has a new open area on the left buttock. The area on the left lateral calf is bigger longer and still covered in necrotic debris. No major change in his foot areas bilaterally. I am awaiting for a dermatologist to look on this. We have been using ketoconazole I do not know that this is been doing any good at all. 2/24; left heel remains closed. The left buttock wound that was new reopening last week looks better. The left lateral calf appears better also although still requires debridement. The major area on his foot is the left first second also requiring debridement. We have been putting Prisma on all wounds. I do not believe that the ketoconazole has done too much good for his feet. He will use Lotrisone I am going to give him a 2-week course of terbinafine. We still do not have a dermatology appointment 3/2 left heel remains closed however there is skin over bone in this area I pointed this out to him today. The left buttock wound is epithelialized but still does not look completely stable. The area on the left leg required debridement were using silver collagen here. With regards to his feet we changed to Lotrisone last week and silver alginate. 3/9; left heel remains closed. Left buttock remains closed. The area on the right foot is essentially closed. The left foot remains unchanged. Slightly smaller on the left lateral calf. Using silver collagen to both of these areas 3/16-Left  heel remains closed. Area on right foot is closed. Left lateral calf above the lateral malleolus open wound requiring debridement with easy bleeding. Left dorsal wound proximal to first toe also debrided. Left ischial area open new. Patient has been using Prisma with wrapping every 3 days. Dermatology appointment is apparently tomorrow.Patient has completed his terbinafine 2-week course with some apparent improvement according to him, there is still flaking and dry skin in his foot on the left 3/23; area on the right foot is reopened. The area on the left anterior foot is about the same still a very necrotic adherent surface. He still has the area on the left leg and reopening is on the left buttock. He apparently saw dermatology although I do not have a note. According to the patient who is usually fairly well informed they did not have any good ideas. Put him on oral terbinafine which she is been on before. 3/30; using silver collagen to all wounds. Apparently his dermatologist put him on doxycycline and rifampin presumably some culture grew staph. I do not have this result. He remains on terbinafine although I have used terbinafine on him before 4/6; patient has had a fairly  substantial reopening on the right foot between the first and second toes. He is finished his terbinafine and I believe is on doxycycline and rifampin still as prescribed by dermatology. We have been using silver collagen to all his wounds although the patient reports that he thinks silver alginate does better on the wounds on his buttock. 4/13; the area on his left lateral calf about the same size but it did not require debridement. Left dorsal foot just proximal to the webspace between the first and second toes is about the same. Still nonviable surface. I note some superficial bronze discoloration of the dorsal part of his foot Right dorsal foot just proximal to the first and second toes also looks about the same. I still  think there may be the same discoloration I noted above on the left Left buttock wound looks about the same 4/20; left lateral calf appears to be gradually contracting using silver collagen. 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 The left dorsal foot wound is debrided of tightly adherent necrotic debris and really cleans up quite nicely. The right area is worse with expansion. I did not debride this it is now over the base of the second toe The area on his left buttock is smaller no debridement is required using silver collagen 5/4; left calf continues to make good progress. He arrives with erythema around the wounds on his dorsal foot which even extends to the plantar aspect. Very concerning for coexistent infection. He is finished the erythromycin I gave him for possible erythrasma this does not seem to have helped. The area on the left foot is about the same base of the dorsal toes Is area on the buttock looks improved on the left 5/11; left calf and left buttock continued to make good progress. Left foot is about the same to slightly improved. Major problem is on the right foot. He has not had an x-ray. Deep tissue culture I did last week showed both Enterobacter and E. coli. I did not change the doxycycline I put him on empirically although neither 1 of these were plated to doxycycline. He arrives today with the erythema looking worse on both the dorsal and plantar foot. Macerated skin on the bottom of the foot. he has not been systemically unwell 5/18-Patient returns at 1 week, left calf wound appears to be making some progress, left buttock wound appears slightly worse than last time, left foot wound looks slightly better, right foot redness is marginally better. X-ray of both feet show no air or evidence of osteomyelitis. Patient is finished his Omnicef and terbinafine. He continues to have macerated skin on the bottom of the left  foot as well as right 5/26; left calf wound is better, left buttock wound appears to have multiple small superficial open areas with surrounding macerated skin. X-rays that I did last time showed no evidence of osteomyelitis in either foot. He is finished cefdinir and doxycycline. I do not think that he was on terbinafine. He continues to have a large superficial open area on the right foot anterior dorsal and slightly between the first and second toes. I did send him to dermatology 2 months ago or so wondering about whether they would do a fungal scraping. I do not believe they did but did do a culture. We have been using silver alginate to the toe areas, he has been using antifungals at home topically either ketoconazole or Lotrisone. We are using silver collagen on  the left foot, silver alginate on the right, silver collagen on the left lateral leg and silver alginate on the left buttock 6/1; left buttock area is healed. We have the left dorsal foot, left lateral leg and right dorsal foot. We are using silver alginate to the areas on both feet and silver collagen to the area on his left lateral calf 6/8; the left buttock apparently reopened late last week. He is not really sure how this happened. He is tolerating the terbinafine. Using silver alginate to all wounds 6/15; left buttock wound is larger than last week but still superficial. Came in the clinic today with a report of purulence from the left lateral leg I did not identify any infection Both areas on his dorsal feet appear to be better. He is tolerating the terbinafine. Using silver alginate to all wounds 6/22; left buttock is about the same this week, left calf quite a bit better. His left foot is about the same however he comes in with erythema and warmth in the right forefoot once again. Culture that I gave him in the beginning of May showed Enterobacter and E. coli. I gave him doxycycline and things seem to improve although neither 1  of these organisms was specifically plated. 6/29; left buttock is larger and dry this week. Left lateral calf looks to me to be improved. Left dorsal foot also somewhat improved right foot completely unchanged. The erythema on the right foot is still present. He is completing the Ceftin dinner that I gave him empirically [see discussion above.) 7/6 - All wounds look to be stable and perhaps improved, the left buttock wound is slightly smaller, per patient bleeds easily, completed ceftin, the right foot redness is less, he is on terbinafine 7/13; left buttock wound about the same perhaps slightly narrower. Area on the left lateral leg continues to narrow. Left dorsal foot slightly smaller right foot about the same. We are using silver alginate on the right foot and Hydrofera Blue to the areas on the left. Unna boot on the left 2 layer compression on the right 7/20; left buttock wound absolutely the same. Area on lateral leg continues to get better. Left dorsal foot require debridement as did the right no major change in the 7/27; left buttock wound the same size necrotic debris over the surface. The area on the lateral leg is closed once again. His left foot looks better right foot about the same although there is some involvement now of the posterior first second toe area. He is still on terbinafine which I have given him for a month, not certain a centimeter major change 06/25/19-All wounds appear to be slightly improved according to report, left buttock wound looks clean, both foot wounds have minimal to no debris the right dorsal foot has minimal slough. We are using Hydrofera Blue to the left and silver alginate to the right foot and ischial wound. 8/10-Wounds all appear to be around the same, the right forefoot distal part has some redness which was not there before, however the wound looks clean and small. Ischial wound looks about the same with no changes 8/17; his wound on the left lateral  calf which was his original chronic venous insufficiency wound remains closed. Since I last saw him the areas on the left dorsal foot right dorsal foot generally appear better but require debridement. The area on his left initial tuberosity appears somewhat larger to me perhaps hyper granulated and bleeds very easily. We have been using Hydrofera  Blue to the left dorsal foot and silver alginate to everything else 8/24; left lateral calf remains closed. The areas on his dorsal feet on the webspace of the first and second toes bilaterally both look better. The area on the left buttock which is the pressure ulcer stage II slightly smaller. I change the dressing to Hydrofera Blue to all areas 8/31; left lateral calf remains closed. The area on his dorsal feet bilaterally look better. Using Hydrofera Blue. Still requiring debridement on the left foot. No change in the left buttock pressure ulcers however 9/14; left lateral calf remains closed. Dorsal feet look quite a bit better than 2 weeks ago. Flaking dry skin also a lot better with the ammonium lactate I gave him 2 weeks ago. The area on the left buttock is improved. He states that his Roho cushion developed a leak and he is getting a new one, in the interim he is offloading this vigorously 9/21; left calf remains closed. Left heel which was a possible DTI looks better this week. He had macerated tissue around the left dorsal foot right foot looks satisfactory and improved left buttock wound. I changed his dressings to his feet to silver alginate bilaterally. Continuing Hydrofera Blue on the left buttock. 9/28 left calf remains closed. Left heel did not develop anything [possible DTI] dry flaking skin on the left dorsal foot. Right foot looks satisfactory. Improved left buttock wound. We are using silver alginate on his feet Hydrofera Blue on the buttock. I have asked him to go back to the Lotrisone on his feet including the wounds and surrounding  areas 10/5; left calf remains closed. The areas on the left and right feet about the same. A lot of this is epithelialized however debris over the remaining open areas. He is using Lotrisone and silver alginate. The area on the left buttock using Hydrofera Blue 10/26. Patient has been out for 3 weeks secondary to Covid concerns. He tested negative but I think his wife tested positive. He comes in today with the left foot substantially worse, right foot about the same. Even more concerning he states that the area on his left buttock closed over but then reopened and is considerably deeper in one aspect than it was before [stage III wound] 11/2; left foot really about the same as last week. Quarter sized wound on the dorsal foot just proximal to the first second toes. Surrounding erythema with areas of denuded epithelium. This is not really much different looking. Did not look like cellulitis this time however. Right foot area about the same.. We have been using silver alginate alginate on his toes Left buttock still substantial irritated skin around the wound which I think looks somewhat better. We have been using Hydrofera Blue here. 11/9; left foot larger than last week and a very necrotic surface. Right foot I think is about the same perhaps slightly smaller. Debris around the circumference also addressed. Unfortunately on the left buttock there is been a decline. Satellite lesions below the major wound distally and now a an additional one posteriorly we have been using Hydrofera Blue but I think this is a pressure issue 11/16; left foot ulcer dorsally again a very adherent necrotic surface. Right foot is about the same. Not much change in the pressure ulcer on his left buttock. 11/30; left foot ulcer dorsally basically the same as when I saw him 2 weeks ago. Very adherent fibrinous debris on the wound surface. Patient reports a lot of drainage as well.  The character of this wound has changed  completely although it has always been refractory. We have been using Iodoflex, patient changed back to alginate because of the drainage. Area on his right dorsal foot really looks benign with a healthier surface certainly a lot better than on the left. Left buttock wounds all improved using Hydrofera Blue 12/7; left dorsal foot again no improvement. Tightly adherent debris. PCR culture I did last week only showed likely skin contaminant. I have gone ahead and done a punch biopsy of this which is about the last thing in terms of investigations I can think to do. He has known venous insufficiency and venous hypertension and this could be the issue here. The area on the right foot is about the same left buttock slightly worse according to our intake nurse secondary to [facility] Blue sticking to the wound 12/14; biopsy of the left foot that I did last time showed changes that could be related to wound healing/chronic stasis dermatitis phenomenon no neoplasm. We have been using silver alginate to both feet. I change the one on the left today to Sorbact and silver alginate to his other 2 wounds 12/28; the patient arrives with the following problems; Major issue is the dorsal left foot which continues to be a larger deeper wound area. Still with a completely nonviable surface Paradoxically the area mirror image on the right on the right dorsal foot appears to be getting better. He had some loss of dry denuded skin from the lower part of his original wound on the left lateral calf. Some of this area looked a little vulnerable and for this reason we put him in wrap that on this side this week The area on his left buttock is larger. He still has the erythematous circular area which I think is a combination of pressure, sweat. This does not look like cellulitis or fungal dermatitis 11/26/2019; -Dorsal left foot large open wound with depth. Still debris over the surface. Using Sorbact The area on the dorsal  right foot paradoxically has closed over He has a reopening on the left ankle laterally at the base of his original wound that extended up into the calf. This appears clean. The left buttock wound is smaller but with very adherent necrotic debris over the surface. We have been using silver alginate here as well The patient had arterial studies done in 2017. He had biphasic waveforms at the dorsalis pedis and posterior tibial bilaterally. ABI in the left was 1.17. Digit waveforms were dampened. He has slight spasticity in the great toes I do not think a TBI would be possible 1/11; the patient comes in today with a sizable reopening between the first and second toes on the right. This is not exactly in the same location where we have been treating wounds previously. According to our intake nurse this was actually fairly deep but 0.6 cm. The area on the left dorsal foot looks about the same the surface is somewhat cleaner using Sorbact, his MRI is in 2 days. We have not managed yet to get arterial studies. The new reopening on the left lateral calf looks somewhat better using alginate. The left buttock wound is about the same using alginate 1/18; the patient had his ARTERIAL studies which were quite normal. ABI in the right at 1.13 with triphasic/biphasic waveforms on the left ABI 1.06 again with triphasic/biphasic waveforms. It would not have been possible to have done a toe brachial index because of spasticity. We have been using  Sorbac to the left foot alginate to the rest of his wounds on the right foot left lateral calf and left buttock 1/25; arrives in clinic with erythema and swelling of the left forefoot worse over the first MTP area. This extends laterally dorsally and but also posteriorly. Still has an area on the left lateral part of the lower part of his calf wound it is eschared and clearly not closed. Area on the left buttock still with surrounding irritation and erythema. Right foot  surface wound dorsally. The area between the right and first and second toes appears better. 2/1; The left foot wound is about the same. Erythema slightly better I gave him a week of doxycycline empirically Right foot wound is more extensive extending between the toes to the plantar surface Left lateral calf really no open surface on the inferior part of his original wound however the entire area still looks vulnerable Absolutely no improvement in the left buttock wound required debridement. 2/8; the left foot is about the same. Erythema is slightly improved I gave him clindamycin last week. Right foot looks better he is using Lotrimin and silver alginate He has a breakdown in the left lateral calf. Denuded epithelium which I have removed Left buttock about the same were using Hydrofera Blue 2/15; left foot is about the same there is less surrounding erythema. Surface still has tightly adherent debris which I have debriding however not making any progress Right foot has a substantial wound on the medial right second toe between the first and second webspace. Still an open area on the left lateral calf distal area. Buttock wound is about the same 2/22; left foot is about the same less surrounding erythema. Surface has adherent debris. Polymen Ag Right foot area significant wound between the first and second toes. We have been using silver alginate here Left lateral leg polymen Ag at the base of his original venous insufficiency wound Left buttock some improvement here 3/1; Right foot is deteriorating in the first second toe webspace. Larger and more substantial. We have been using silver alginate. Left dorsal foot about the same markedly adherent surface debris using PolyMem Ag Left lateral calf surface debris using PolyMem AG Left buttock is improved again using PolyMem Ag. He is completing his terbinafine. The erythema in the foot seems better. He has been on this for 2 weeks 3/8; no  improvement in any wound area in fact he has a small open area on the dorsal midfoot which is new this week. He has not gotten his foot x-rays yet 3/15; his x-rays were both negative for osteomyelitis of both feet. No major change in any of his wounds on the extremities however his buttock wounds are better. We have been using polymen on the buttocks, left lower leg. Iodoflex on the left foot and silver alginate on the right 3/22; arrives in clinic today with the 2 major issues are the improvement in the left dorsal foot wound which for once actually looks healthy with a nice healthy wound surface without debridement. Using Iodoflex here. Unfortunately on the left lateral calf which is in the distal part of his original wound he came to the clinic here for there was purulent drainage noted some increased breakdown scattered around the original area and a small area proximally. We we are using polymen here will change to silver alginate today. His buttock wound on the left is better and I think the area on the right first second toe webspace is also improved  3/29; left dorsal foot looks better. Using Iodoflex. Left ankle culture from deterioration last time grew E. coli, Enterobacter and Enterococcus. I will give him a course of cefdinir although that will not cover Enterococcus. The area on the right foot in the webspace of the first and second toe lateral first toe looks better. The area on his buttock is about healed Vascular appointment is on April 21. This is to look at his venous system vis--vis continued breakdown of the wounds on the left including the left lateral leg and left dorsal foot he. He has had previous ablations on this side 4/5; the area between the right first and second toes lateral aspect of the first toe looks better. Dorsal aspect of the left first toe on the left foot also improved. Unfortunately the left lateral lower leg is larger and there is a second satellite wound  superiorly. The usual superficial abrasions on the left buttock overall better but certainly not closed 4/12; the area between the right first and second toes is improved. Dorsal aspect of the left foot also slightly smaller with a vibrant healthy looking surface. No real change in the left lateral leg and the left buttock wound is healed He has an unaffordable co-pay for Apligraf. Appointment with vein and vascular with regards to the left leg venous part of the circulation is on 4/21 4/19; we continue to see improvement in all wound areas. Although this is minor. He has his vascular appointment on 4/21. The area on the left buttock has not reopened although right in the center of this area the skin looks somewhat threatened 4/26; the left buttock is unfortunately reopened. In general his left dorsal foot has a healthy surface and looks somewhat smaller although it was not measured as such. The area between his first and second toe webspace on the right as a small wound against the first toe. The patient saw vascular surgery. The real question I was asking was about the small saphenous vein on the left. He has previously ablated left greater saphenous vein. Nothing further was commented on on the left. Right greater saphenous vein without reflux at the saphenofemoral junction or proximal thigh there was no indication for ablation of the right greater saphenous vein duplex was negative for DVT bilaterally. They did not think there was anything from a vascular surgery point of view that could be offered. They ABIs within normal limits 5/3; only small open area on the left buttock. The area on the left lateral leg which was his original venous reflux is now 2 wounds both which look clean. We are using Iodoflex on the left dorsal foot which looks healthy and smaller. He is down to a very tiny area between the right first and second toes, using silver alginate 5/10; all of his wounds appear better. We  have much better edema control in 4 layer compression on the left. This may be the factor that is allowing the left foot and left lateral calf to heal. He has external compression garments at home 04/14/20-All of his wounds are progressing well, the left forefoot is practically closed, left ischium appears to be about the same, right toe webspace is also smaller. The left lateral leg is about the same, continue using Hydrofera Blue to this, silver alginate to the ischium, Iodoflex to the toe space on the right 6/7; most of his wounds outside of the left buttock are doing well. The area on the left lateral calf and left dorsal foot  are smaller. The area on the right foot in between the first and second toe webspace is barely visible although he still says there is some drainage here is the only reason I did not heal this out. Unfortunately the area on the left buttock almost looks like he has a skin tear from tape. He has open wound and then a large flap of skin that we are trying to get adherence over an area just next to the remaining wound 6/21; 2 week follow-up. I believe is been here for nurse visits. Miraculously the area between his first and second toes on the left dorsal foot is closed over. Still open on the right first second web space. The left lateral calf has 2 open areas. Distally this is more superficial. The proximal area had a little more depth and required debridement of adherent necrotic material. His buttock wound is actually larger we have been using silver alginate here 6/28; the patient's area on the left foot remains closed. Still open wet area between the first and second toes on the right and also extending into the plantar aspect. We have been using silver alginate in this location. He has 2 areas on the left lower leg part of his original long wounds which I think are better. We have been using Hydrofera Blue here. Hydrofera Blue to the left buttock which is stable 7/12; left  foot remains closed. Left ankle is closed. May be a small area between his right first and second toes the only truly open area is on the left buttock. We have been using Hydrofera Blue here 7/19; patient arrives with marked deterioration especially in the left foot and ankle. We did not put him in a compression wrap on the left last week in fact he wore his juxta lite stockings on either side although he does not have an underlying stocking. He has a reopening on the left dorsal foot, left lateral ankle and a new area on the right dorsal ankle. More worrisome is the degree of erythema on the left foot extending on the lateral foot into the lateral lower leg on the left 7/26; the patient had erythema and drainage from the lateral left ankle last week. Culture of this grew MRSA resistant to doxycycline and clindamycin which are the 2 antibiotics we usually use with this patient who has multiple antibiotic allergies including linezolid, trimethoprim sulfamethoxazole. I had give him an empiric doxycycline and he comes in the area certainly looks somewhat better although it is blotchy in his lower leg. He has not been systemically unwell. He has had areas on the left dorsal foot which is a reopening, chronic wounds on the left lateral ankle. Both of these I think are secondary to chronic venous insufficiency. The area between his first and second toes is closed as far as I can tell. He had a new wrap injury on the right dorsal ankle last week. Finally he has an area on the left buttock. We have been using silver alginate to everything except the left buttock we are using Hydrofera Blue 06/30/20-Patient returns at 1 week, has been given a sample dose pack of NUZYRA which is a tetracycline derivative [omadacycline], patient has completed those, we have been using silver alginate to almost all the wounds except the left ischium where we are using Hydrofera Blue all of them look better 8/16; since I last saw the  patient he has been doing well. The area on the left buttock, left lateral ankle and  left foot are all closed today. He has completed the [place] I gave him last time and tolerated this well. He still has open areas on the right dorsal ankle and in the right first second toe area which we are using silver alginate. 8/23; we put him in his bilateral external compression stockings last week as he did not have anything open on either leg except for concerning area between the right first and second toe. He comes in today with an area on the left dorsal foot slightly more proximal than the original wound, the left lateral foot but this is actually a continuation of the area he had on the left lateral ankle from last time. As well he is opened up on the left buttock again. 8/30; comes in today with things looking a lot better. The area on the left lower ankle has closed down as has the left foot but with eschar in both areas. The area on the dorsal right ankle is also epithelialized. Very little remaining of the left buttock wound. We have been using silver alginate on all wound areas 9/13; the area in the first second toe webspace on the right has fully epithelialized. He still has some vulnerable epithelium on the right and the ankle and the dorsal foot. He notes weeping. He is using his juxta lite stocking. On the left again the left dorsal foot is closed left lateral ankle is closed. We went to the juxta lite stocking here as well. Still vulnerable in the left buttock although only 2 small open areas remain here 9/27; 2-week follow-up. We did not look at his left leg but the patient says everything is closed. He is a bit disturbed by the amount of edema in his left foot he is using juxta lite stockings but asking about over the toes stockings which would be 30/40, will talk to him next time. According to him there is no open wound on either the left foot or the left ankle/calf He has an open area on the  dorsal right calf which I initially point a wrap injury. He has superficial remaining wound on the left ischial tuberosity been using silver alginate although he says this sticks to the wound 10/5; we gave him 2-week follow-up but he called yesterday expressing some concerns about his right foot right ankle and the left buttock. He came in early. There is still no open areas on the left leg and that still in his juxta lite stocking 10/11; he only has 1 small area on the left buttock that remains measuring millimeters 1 mm. Still has the same irritated skin in this area. We recommended zinc oxide when this eventually closes and pressure relief is meticulously is he can do this. He still has an area on the dorsal part of his right first through third toes which is a bit irritated and still open and on the dorsal ankle near the crease of the ankle. We have been using silver alginate and using his own stocking. He has nothing open on the left leg or foot 10/25; 2-week follow-up. Not nearly as good on the left buttock as I was hoping. For open areas with 5 looking threatened small. He has the erythematous irritated chronic skin in this area. 1 area on the right dorsal ankle. He reports this area bleeds easily Right dorsal foot just proximal to the base of his toes We have been using silver alginate. 11/8; 2-week follow-up. Left buttock is about the same although I  do not think the wounds are in the same location we have been using silver alginate. I have asked him to use zinc oxide on the skin around the wounds. He still has a small area on the right dorsal ankle he reports this bleeds easily Right dorsal foot just proximal to the base of the toes does not have anything open although the skin is very dry and scaly He has a new opening on the nailbed of the left great toe. Nothing on the left ankle 11/29; 3-week follow-up. Left buttock has 2 open areas. And washing of these wounds today started bleeding  easily. Suggesting very friable tissue. We have been using silver alginate. Right dorsal ankle which I thought was initially a wrap injury we have been using silver alginate. Nothing open between the toes that I can see. He states the area on the left dorsal toe nailbed healed after the last visit in 2 or 3 days 12/13; 3-week follow-up. His left buttock now has 3 open areas but the original 2 areas are smaller using polymen here. Surrounding skin looks better. The right dorsal ankle is closed. He has a small opening on the right dorsal foot at the level of the third toe. In general the skin looks better here. He is wearing his juxta lite stocking on the left leg says there is nothing open 11/24/2020; 3 weeks follow-up. His left buttock still has the 3 open areas. We have been using polymen but due to lack of response he changed to [facility] area. Surrounding skin is dry erythematous and irritated looking. There is no evidence of infection either bacterial or fungal however there is loss of surface epithelium He still has very dry skin in his foot causing irritation and erythema on the dorsal part of his toes. This is not responded to prolonged courses of antifungal simply looks dry and irritated 1/24; left buttock area still looks about the same he was unable to find the triad ointment that we had suggested. The area on the right lower leg just above the dorsal ankle has reopened and the areas on the right foot between the first second and second third toes and scaling on the bottom of the foot has been about the same for quite some time now. been using silver alginate to all wound areas 2/7; left buttock wound looked quite good although not much smaller in terms of surface area surrounding skin looks better. Only a few dry flaking areas on the right foot in between the first and second toes the skin generally looks better here [ammonium lactate]. Finally the area on the right dorsal ankle is  closed 2/21; There is no open area on the right foot even between the right first and second toe. Skin around this area dorsally and plantar aspects look better. He has a reopening of the area on the right ankle just above the crease of the ankle dorsally. I continue to think that this is probably friction from spasms may be even this time with his stocking under the compression stockings. Wounds on his left buttock look about the same there a couple of areas that have reopened. He has a total square area of loss of epithelialization. This does not look like infection it looks like a contact dermatitis but I just cannot determine to what 3/14; there is nothing on the right foot between the first and second toes this was carefully inspected under illumination. Some chronic irritation on the dorsal part of his  foot from toes 1-3 at the base. Nothing really open here substantially. Still has an area on the right foot/ankle that is actually larger and hyper granulated. His buttock area on the left is just about closed however he has chronic inflammation with loss of the surface epithelial layer 3/28; 2-week follow-up. In clinic today with a new wound on the left anterior mid tibia. Says this happened about 2 weeks ago. He is not really sure how wonders about the spasticity of his legs at night whether that could have caused this other than that he does not have a good idea. He has been using topical antibiotics and silver alginate. The area on his right dorsal ankle seems somewhat better. Finally everything on his left buttock is closed. 4/11; 2-week follow-up. All of his wounds are better except for the area over the ischium and left buttock which have opened up widely again. At least part of this is covered in necrotic fibrinous material another part had rolled nonviable skin. The area on the right ankle, left anterior mid tibia are both a lot better. He had no open wounds on either foot including the  areas between the first and second toes 4/25; patient presents for 2-week follow-up. He states that the wounds are overall stable. He has no complaints today and states he is using Hydrofera Blue to open wounds. 5/9; have not seen this man in over a month. For my memory he has open areas on the left mid tibia and right ankle. T oday he has new open area on the right dorsal foot which we have not had a problem with recently. He has the sustained area on the left buttock He is also changed his insurance at the beginning of the year [employer]. We will need prior authorizations for debridement 5/23; patient presents for 2-week follow-up. He has prior authorizations for debridement. He denies any issues in the past 2 weeks with his wound care. He has been using Hydrofera Blue to all the wounds. He does report a circular rash to the upper left leg that is new. He denies acute signs of infection. 6/6; 2-week follow-up. The patient has open wounds on the left buttock which are worse than the last time I saw this about a month ago. He also has a new area to me on the left anterior mid tibia with some surrounding erythema. The area on the dorsal ankle on the right is closed but I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 as this is where I put all of his last Apligraf. Surface debridement done with a curette of the lower wound which looks very healthy. The area on the left foot also looks quite satisfactory at the dorsal artery at the first and second toes 05/10/16; continue Apligraf to this. Her wound, Hydrafera to the lower wound. He has a new area on the right second toe. Left dorsal foot firstoosecond toe also looks improved 05/24/16; wound dimensions must be smaller I was able to use Apligraf to all 3 remaining wound areas. 06/07/16 patient's last Apligraf was 2 weeks ago. He arrives today with the 2 wounds on his lateral left leg joined together. This would have to be seen as a negative. He also has a small wound in his first and second toe on the left dorsally with quite a bit of surrounding erythema in the first second and third toes. This looks to be infected or inflamed, very difficult clinical call. 06/21/16: lateral left leg combined wounds. Adherent surface slough area on the left dorsal foot at roughly the fourth toe looks improved 07/12/16; he now has a single linear wound on the lateral left leg. This does not look to be a lot changed from when I lost saw this. The area on his dorsal left foot looks considerably better however. 08/02/16; no major change in the substantial area on his left lateral leg since last time. We have been using Hydrofera Blue for a prolonged period of time now. The area on his left foot is also unchanged from last review 07/19/16; the area on his dorsal foot on the left looks considerably smaller. He is beginning to have significant rims of epithelialization on the lateral left leg wound. This also looks better. 08/05/16; the patient came in for  a nurse visit today. Apparently the area on his left lateral leg looks better and it was wrapped. However in general discussion the patient noted a new area on the dorsal aspect of his right second toe. The exact etiology of this is unclear but likely relates to pressure. 08/09/16 really the area on the left lateral leg did not really look that healthy today perhaps slightly larger and measurements. The area on his dorsal right second toe is improved also the left foot wound looks stable to improved 08/16/16; the area on the last lateral leg did not change any of dimensions. Post debridement with a curet the area looked better. Left foot wound improved and the area on the dorsal right second toe is improved 08/23/16; the area on the left lateral leg may be slightly smaller both in terms of length and width. Aggressive debridement with a curette afterwards the tissue appears healthier. Left foot wound appears improved in the area on the dorsal right second toe is improved 08/30/16 patient developed a fever over the weekend and was seen in an urgent care. Felt to have a UTI and put on doxycycline. He has been since changed over the phone to [facility] After we took off the wrap on his right  leg today the leg is swollen warm and erythematous, probably more likely the source of the fever 09/06/16; have been using collagen to the major left leg wound, silver alginate to the area on his anterior foot/toes 09/13/16; the areas on his anterior foot/toes on both sides appear to be virtually closed. Extensive wound on the left lateral leg perhaps slightly narrower but each visit still covered an adherent surface slough 09/16/16 patient was in for his usual Thursday nurse visit however the intake nurse noted significant erythema of his dorsal right foot. He is also running a low- grade fever and having increasing spasms in the right leg 09/20/16 here for cellulitis involving his right great toes and forefoot. This is  a lot better. Still requiring debridement on his left lateral leg. Santyl direct says he needs prior authorization. Therefore his wife cannot change this at home 09/30/16; the patient's extensive area on the left lateral calf and ankle perhaps somewhat better. Using Santyl. The area on the left toes is healed and I think the area on his right dorsal foot is healed as well. There is no cellulitis or venous inflammation involving the right leg. He is going to need compression stockings here. 10/07/16; the patient's extensive wound on the left lateral calf and ankle does not measure any differently however there appears to be less adherent surface slough using Santyl and aggressive weekly debridements 10/21/16; no major change in the area on the left lateral calf. Still the same measurement still very difficult to debridement adherent slough and nonviable subcutaneous tissue. This is not really been helped by several weeks of Santyl. Previously for 2 weeks I used Iodoflex for a short period. A prolonged course of Hydrofera Blue didn't really help. I'm not sure why I only used 2 weeks of Iodoflex on this there is no evidence of surrounding infection. He has a small area on the right second toe which looks as though it's progressing towards closure 10/28/16; the wounds on his toes appear to be closed. No major change in the left lateral leg wound although the surface looks somewhat better using Iodoflex. He has had previous arterial studies that were normal. He has had reflux studies and is status post ablation although I don't have any exact notes on which vein was ablated. I'll need to check the surgical record 11/04/16; he's had a reopening between the first and second toe on the left and right. No major change in the left lateral leg wound. There is what appears to be cellulitis of the left dorsal foot 11/18/16 the patient was hospitalized initially in [place] and then subsequently transferred to [facility]  long and was admitted there from 11/09/16 through 11/12/16. He had developed progressive cellulitis on the right leg in spite of the doxycycline I gave him. I'd spoken to the hospitalist in [place] who was concerned about continuing leukocytosis. CT scan is what I suggested this was done which showed soft tissue swelling without evidence of osteomyelitis or an underlying abscess blood cultures were negative. At [facility] he was treated with vancomycin and Primaxin and then add an infectious disease consult. He was transitioned to Ceftaroline. He has been making progressive improvement. Overall a severe cellulitis of the right leg. He is been using silver alginate to her original wound on the left leg. The wounds in his toes on the right are closed there is a small open area on the base of the left second toe 11/26/15; the patient's right leg is much better although  there is still some edema here this could be reminiscent from his severe cellulitis likely on top of some degree of lymphedema. His left anterior leg wound has less surface slough as reported by her intake nurse. Small wound at the base of the left second toe 12/02/16; patient's right leg is better and there is no open wound here. His left anterior lateral leg wound continues to have a healthy-looking surface. Small wound at the base of the left second toe however there is erythema in the left forefoot which is worrisome 12/16/16; is no open wounds on his right leg. We took measurements for stockings. His left anterior lateral leg wound continues to have a healthy-looking surface. I'm not sure where we were with the Apligraf run through his insurance. We have been using Iodoflex. He has a thick eschar on the left first second toe interface, I suspect this may be fungal however there is no visible open 12/23/16; no open wound on his right leg. He has 2 small areas left of the linear wound that was remaining last week. We have been using Prisma, I  thought I have disclosed this week, we can only look forward to next week 01/03/17; the patient had concerning areas of erythema last week, already on doxycycline for UTI through his primary doctor. The erythema is absolutely no better there is warmth and swelling both medially from the left lateral leg wound and also the dorsal left foot. 01/06/17- Patient is here for follow-up evaluation of his left lateral leg ulcer and bilateral feet ulcers. He is on oral antibiotic therapy, tolerating that. Nursing staff and the patient states that the erythema is improved from Monday. 01/13/17; the predominant left lateral leg wound continues to be problematic. I had put Apligraf on him earlier this month once. However he subsequently developed what appeared to be an intense cellulitis around the left lateral leg wound. I gave him Dalvance I think on 2/12 perhaps 2/13 he continues on cefdinir. The erythema is still present but the warmth and swelling is improved. I am hopeful that the cellulitis part of this control. I wouldn't be surprised if there is an element of venous inflammation as well. 01/17/17. The erythema is present but better in the left leg. His left lateral leg wound still does not have a viable surface buttons certain parts of this long thin wound it appears like there has been improvement in dimensions. 01/20/17; the erythema still present but much better in the left leg. I'm thinking this is his usual degree of chronic venous inflammation. The wound on the left leg looks somewhat better. Is less surface slough 01/27/17; erythema is back to the chronic venous inflammation. The wound on the left leg is somewhat better. I am back to the point where I like to try an Apligraf once again 02/10/17; slight improvement in wound dimensions. Apligraf #2. He is completing his doxycycline 02/14/17; patient arrives today having completed doxycycline last Thursday. This was supposed to be a nurse visit however once  again he hasn't tense erythema from the medial part of his wound extending over the lower leg. Also erythema in his foot this is roughly in the same distribution as last time. He has baseline chronic venous inflammation however this is a lot worse than the baseline I have learned to accept the on him is baseline inflammation 02/24/17- patient is here for follow-up evaluation. He is tolerating compression therapy. His voicing no complaints or concerns he is here anticipating an Apligraf 03/03/17;  he arrives today with an adherent necrotic surface. I don't think this is surface is going to be amenable for Apligraf's. The erythema around his wound and on the left dorsal foot has resolved he is off antibiotics 03/10/17; better-looking surface today. I don't think he can tolerate Apligraf's. He tells me he had a wound VAC after a skin graft years ago to this area and they had difficulty with a seal. The erythema continues to be stable around this some degree of chronic venous inflammation but he also has recurrent cellulitis. We have been using Iodoflex 03/17/17; continued improvement in the surface and may be small changes in dimensions. Using Iodoflex which seems the only thing that will control his surface 03/24/17- He is here for follow up evaluation of his LLE lateral ulceration and ulcer to right dorsal foot/toe space. He is voicing no complaints or concerns, He is tolerating compression wrap. 03/31/17 arrives today with a much healthier looking wound on the left lower extremity. We have been using Iodoflex for a prolonged period of time which has for the first time prepared and adequate looking wound bed although we have not had much in the way of wound dimension improvement. He also has a small wound between the first and second toe on the right 04/07/17; arrives today with a healthy-looking wound bed and at least the top 50% of this wound appears to be now her. No debridement was required I have changed  him to [facility] last week after prolonged Iodoflex. He did not do well with Apligraf's. We've had a re-opening between the first and second toe on the right 04/14/17; arrives today with a healthier looking wound bed contractions and the top 50% of this wound and some on the lesser 50%. Wound bed appears healthy. The area between the first and second toe on the right still remains problematic 04/21/17; continued very gradual improvement. Using [facility] 04/28/17; continued very gradual improvement in the left lateral leg venous insufficiency wound. His periwound erythema is very mild. We have been using Hydrofera Blue. Wound is making progress especially in the superior 50% 05/05/17; he continues to have very gradual improvement in the left lateral venous insufficiency wound. Both in terms with an length rings are improving. I debrided this every 2 weeks with #5 curet and we have been using Hydrofera Blue and again making good progress With regards to the wounds between his right first and second toe which I thought might of been tinea pedis he is not making as much progress very dry scaly skin over the area. Also the area at the base of the left first and second toe in a similar condition 05/12/17; continued gradual improvement in the refractory left lateral venous insufficiency wound on the left. Dimension smaller. Surface still requiring debridement using Hydrofera Blue 05/19/17; continued gradual improvement in the refractory left lateral venous ulceration. Careful inspection of the wound bed underlying rumination suggested some degree of epithelialization over the surface no debridement indicated. Continue Hydrofera Blue difficult areas between his toes first and third on the left than first and second on the right. I'm going to change to silver alginate from silver collagen. Continue ketoconazole as I suspect underlying tinea pedis 05/26/17; left lateral leg venous insufficiency wound. We've  been using Hydrofera Blue. I believe that there is expanding epithelialization over the surface of the wound albeit not coming from the wound circumference. This is a bit of an odd situation in which the epithelialization seems to be coming  from the surface of the wound rather than in the exact circumference. There is still small open areas mostly along the lateral margin of the wound. ooHe has unchanged areas between the left first and second and the right first second toes which I been treating for tenia pedis 06/02/17; left lateral leg venous insufficiency wound. We have been using Hydrofera Blue. Somewhat smaller from the wound circumference. The surface of the wound remains a bit on it almost epithelialized sedation in appearance. I use an open curette today debridement in the surface of all of this especially the edges ooSmall open wounds remaining on the dorsal right first and second toe interspace and the plantar left first second toe and her face on the left 06/09/17; wound on the left lateral leg continues to be smaller but very gradual and very dry surface using Hydrofera Blue 06/16/17 requires weekly debridements now on the left lateral leg although this continues to contract. I changed to silver collagen last week because of dryness of the wound bed. Using Iodoflex to the areas on his first and second toes/web space bilaterally 06/24/17; patient with history of paraplegia also chronic venous insufficiency with lymphedema. Has a very difficult wound on the left lateral leg. This has been gradually reducing in terms of with but comes in with a very dry adherent surface. High switch to silver collagen a week or so ago with hydrogel to keep the area moist. This is been refractory to multiple dressing attempts. He also has areas in his first and second toes bilaterally in the anterior and posterior web space. I had been using Iodoflex here after a prolonged course of silver alginate with  ketoconazole was ineffective [question tinea pedis] 07/14/17; patient arrives today with a very difficult adherent material over his left lateral lower leg wound. He also has surrounding erythema and poorly controlled edema. He was switched his Santyl last visit which the nurses are applying once during his doctor visit and once on a nurse visit. He was also reduced to 2 layer compression I'm not exactly sure of the issue here. 07/21/17; better surface today after 1 week of Iodoflex. Significant cellulitis that we treated last week also better. [Doxycycline] 07/28/17 better surface today with now 2 weeks of Iodoflex. Significant cellulitis treated with doxycycline. He has now completed the doxycycline and he is back to his usual degree of chronic venous inflammation/stasis dermatitis. He reminds me he has had ablations surgery here 08/04/17; continued improvement with Iodoflex to the left lateral leg wound in terms of the surface of the wound although the dimensions are better. 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 Problematic areas on the plantar aspect of the first second toe web space on the left and the dorsal aspect of the first second toe web space on the right. At one point I felt these were probably related to chronic fungal infections in treated him aggressively for this although we have not made any improvement here. 08/11/17; left lateral leg. Surface continues to improve with the Iodoflex although we are not seeing much improvement in overall wound dimensions. Areas on his plantar left foot and right foot show no improvement. In fact the right foot looks somewhat worse 08/18/17; left lateral leg. We changed to [facility] Blue last week after a prolonged course of Iodoflex which helps get the surface better. It appears that the wound with is improved. Continue with difficult areas on the left dorsal first  second and plantar  first second on the right 09/01/17; patient arrives in clinic today having had a temperature of 103 yesterday. He was seen in the ER and [facility]. The patient was concerned he could have cellulitis again in the right leg however they diagnosed him with a UTI and he is now on Keflex. He has a history of cellulitis which is been recurrent and difficult but this is been in the left leg, in the past 5 use doxycycline. He does in and out catheterizations at home which are risk factors for UTI 09/08/17; patient will be completing his Keflex this weekend. The erythema on the left leg is considerably better. He has a new wound today on the medial part of the right leg small superficial almost looks like a skin tear. He has worsening of the area on the right dorsal first and second toe. His major area on the left lateral leg is better. Using Hydrofera Blue on all areas 09/15/17; gradual reduction in width on the long wound in the left lateral leg. No debridement required. He also has wounds on the plantar aspect of his left first second toe web space and on the dorsal aspect of the right first second toe web space. 09/22/17; there continues to be very gradual improvements in the dimensions of the left lateral leg wound. He hasn't round erythematous spot with might be pressure on his wheelchair. There is no evidence obviously of infection no purulence no warmth ooHe has a dry scaled area on the plantar aspect of the left first second toe ooImproved area on the dorsal right first second toe. 09/29/17; left lateral leg wound continues to improve in dimensions mostly with an is still a fairly long but increasingly narrow wound. ooHe has a dry scaled area on the plantar aspect of his left first second toe web space ooIncreasingly concerning area on the dorsal right first second toe. In fact I am concerned today about possible cellulitis around this wound. The areas extending up his second toe and although there  is deformities here almost appears to abut on the nailbed. 10/06/17; left lateral leg wound continues to make very gradual progress. Tissue culture I did from the right first second toe dorsal foot last time grew MRSA and enterococcus which was vancomycin sensitive. This was not sensitive to clindamycin or doxycycline. He is allergic to Zyvox and sulfa we have therefore arrange for him to have dalvance infusion tomorrow. He is had this in the past and tolerated it well 10/20/17; left lateral leg wound continues to make decent progress. This is certainly reduced in terms of with there is advancing epithelialization.ooThe cellulitis in the right foot looks better although he still has a deep wound in the dorsal aspect of the first second toe web space. Plantar left first toe web space on the left I think is making some progress 10/27/17; left lateral leg wound continues to make decent progress. Advancing epithelialization.using Hydrofera Blue ooThe right first second toe web space wound is better-looking using silver alginate ooImprovement in the left plantar first second toe web space. Again using silver alginate 11/03/17 left lateral leg wound continues to make decent progress albeit slowly. Using Hydrofera Blue ooThe right per second toe web space continues to be a very problematic looking punched out wound. I obtained a piece of tissue for deep culture I did extensively treated this for fungus. It is difficult to imagine that this is a pressure area as the patient states other than going outside  he doesn't really wear shoes at home ooThe left plantar first second toe web space looked fairly senescent. Necrotic edges. This required debridement oochange to Hydrofera Blue to all wound areas 11/10/17; left lateral leg wound continues to contract. Using Hydrofera Blue ooOn the right dorsal first second toe web space dorsally. Culture I did of this area last week grew MRSA there is not an easy  oral option in this patient was multiple antibiotic allergies or intolerances. This was only a rare culture isolate I'm therefore going to use Bactroban under silver alginate ooOn the left plantar first second toe web space. Debridement is required here. This is also unchanged 11/17/17; left lateral leg wound continues to contract using Hydrofera Blue this is no longer the major issue. ooThe major concern here is the right first second toe web space. He now has an open area going from dorsally to the plantar aspect. There is now wound on the inner lateral part of the first toe. Not a very viable surface on this. There is erythema spreading medially into the forefoot. ooNo major change in the left first second toe plantar wound 11/24/17; left lateral leg wound continues to contract using Hydrofera Blue. Nice improvement today ooThe right first second toe web space all of this looks a lot less angry than last week. I have given him clindamycin and topical Bactroban for MRSA and terbinafine for the possibility of underlining tinea pedis that I could not control with ketoconazole. Looks somewhat better ooThe area on the plantar left first second toe web space is weeping with dried debris around the wound 12/01/17; left lateral leg wound continues to contract he Hydrofera Blue. It is becoming thinner in terms of with nevertheless it is making good improvement. ooThe right first second toe web space looks less angry but still a large necrotic-looking wounds starting on the plantar aspect of the right foot extending between the toes and now extensively on the base of the right second toe. I gave him clindamycin and topical Bactroban for MRSA anterior benefiting for the possibility of underlying tinea pedis. Not looking better today ooThe area on the left first/second toe looks better. Debrided of necrotic debris 12/05/17* the patient was worked in urgently today because over the weekend he found blood on  his incontinence bad when he woke up. He was found to have an ulcer by his wife who does most of his wound care. He came in today for [place] to look at this. He has not had a history of wounds in his buttocks in spite of his paraplegia. 12/08/17; seen in follow-up today at his usual appointment. He was seen earlier this week and found to have a new wound on his buttock. We also follow him for wounds on the left lateral leg, left first second toe web space and right first second toe web space 12/15/17; we have been using Hydrofera Blue to the left lateral leg which has improved. The right first second toe web space has also improved. Left first second toe web space plantar aspect looks stable. The left buttock has worsened using Santyl. Apparently the buttock has drainage 12/22/17; we have been using Hydrofera Blue to the left lateral leg which continues to improve now 2 small wounds separated by normal skin. He tells [place] he had a fever up to 100 yesterday he is prone to UTIs but has not noted anything different. He does in and out catheterizations. The area between the first and second toes today does not  look good necrotic surface covered with what looks to be purulent drainage and erythema extending into the third toe. I had gotten this to something that I thought look better last time however it is not look good today. He also has a necrotic surface over the buttock wound which is expanded. I thought there might be infection under here so I removed a lot of the surface with a #5 curet though nothing look like it really needed culturing. He is been using Santyl to this area 12/27/17; his original wound on the left lateral leg continues to improve using Hydrofera Blue. I gave him samples of Baxdella although he was unable to take them out of fear for an allergic reaction ["lump in his throat"].the culture I did of the purulent drainage from his second toe last week showed both enterococcus and a set  Enterobacter I was also concerned about the erythema on the bottom of his foot although paradoxically although this looks somewhat better today. 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 We've been using silver alginate here. They came up today that he sleeps in a recliner, I'm not sure why but I asked him to stop this 01/03/18; his original wound we've been using Hydrofera Blue is now separated into 2 areas. ooUlcer on his left buttock is better he is off the recliner and sleeping in bed ooFinally both wound areas between his first and second toes also looks some better 01/10/18; his original wound on the left lateral leg is now separated into 2 wounds we've been using Hydrofera Blue ooUlcer on his left buttock has some drainage. There is a small probing site going into muscle layer superiorly.using silver alginate -He arrives today with a deep tissue injury on the left heel ooThe wound on the dorsal aspect of his first second toe on the left looks a lot betterusing silver alginate ketoconazole ooThe area on the first second toe web space on the right also looks a lot bette 01/17/18; his original wound on the left lateral leg continues to progress using Hydrofera Blue ooUlcer on his left buttock also is smaller surface healthier except for a small probing site going into the muscle layer superiorly. 2.4 cm of tunneling in this area ooDTI on his left heel we have only been offloading. Looks better than last week no threatened open no evidence of infection oothe wound on the dorsal aspect of the first second toe on the left continues to look like it's regressing we have only been using silver alginate and terbinafine orally ooThe area in the first second toe web space on the right also looks to be a lot better using silver alginate and terbinafine I think this was prompted by tinea pedis 01/31/18; the  patient was hospitalized in [place] last week apparently for a complicated UTI. He was discharged on cefepime he does in and out catheterizations. In the hospital he was discovered M I don't mild elevation of AST and ALT and the terbinafine was stopped.predictably the pressure ulcer on s his buttock looks betterusing silver alginate. The area on the left lateral leg also is better using Hydrofera Blue. The area between the first and second toes on the left better. First and second toes on the right still substantial but better. Finally the DTI on the left heel has held together and looks like it's resolving 02/07/18-he is here in follow-up evaluation for multiple ulcerations. He  has new injury to the lateral aspect of the last issue a pressure ulcer, he states this is from adhesive removal trauma. He states he has tried multiple adhesive products with no success. All other ulcers appear stable. The left heel DTI is resolving. We will continue with same treatment plan and follow-up next week. 02/14/18; follow-up for multiple areas. ooHe has a new area last week on the lateral aspect of his pressure ulcer more over the posterior trochanter. The original pressure ulcer looks quite stable has healthy granulation. 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 Almost fully epithelialized on the original superior area using Hydrofera Blue ooDTI on the left heel has peeled off this week to reveal a small superficial wound under denuded skin and subcutaneous tissue ooBoth areas between the first and second toes look better including nothing open on the left 02/21/18; ooThe patient's wounds on his left ischial tuberosity and posterior left greater trochanter actually looked better. He has a large area of irritation around the area which I think is contact dermatitis. I am doubtful that this is  fungal ooHis original wound on the left lateral calf continues to improve we have been using Hydrofera Blue ooThere is no open area in the left first second toe web space although there is a lot of thick callus ooThe DTI on the left heel required debridement today of necrotic surface eschar and subcutaneous tissue using silver alginate ooFinally the area on the right first second toe webspace continues to contract using silver alginate and ketoconazole 02/28/18 ooLeft ischial tuberosity wounds look better using silver alginate. ooOriginal wound on the left calf only has one small open area left using Hydrofera Blue ooDTI on the left heel required debridement mostly removing skin from around this wound surface. Using silver alginate ooThe areas on the right first/second toe web space using silver alginate and ketoconazole 03/08/18 on evaluation today patient appears to be doing decently well as best I can tell in regard to his wounds. This is the first time that I have seen him as he generally is followed by Dr. Dellia Nims. With that being said none of his wounds appear to be infected he does have an area where there is some skin covering what appears to be a new wound on the left dorsal surface of his great toe. This is right at the nail bed. With that being said I do believe that debrided away some of the excess skin can be of benefit in this regard. Otherwise he has been tolerating the dressing changes without complication. 03/14/18; patient arrives today with the multiplicity of wounds that we are following. He has not been systemically unwell ooOriginal wound on the left lateral calf now only has 2 small open areas we've been using Hydrofera Blue which should continue ooThe deep tissue injury on the left heel requires debridement today. We've been using silver alginate ooThe left first second toe and the right first second toe are both are reminiscence what I think was tinea pedis. Apparently  some of the callus Surface between the toes was removed last week when it started draining. ooPurulent drainage coming from the wound on the ischial tuberosity on the left. 03/21/18-He is here in follow-up evaluation for multiple wounds. 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 He tolerated debridement. The only change to last week's recommendations is to discontinue  antifungal cream between toes. He will follow-up next week 03/28/18; following up for multiple wounds;Concern this week is streaking redness and swelling in the right foot. He is going to need antibiotics for this. 03/31/18; follow-up for right foot cellulitis. Streaking redness and swelling in the right foot on 03/28/18. 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 I put him on clindamycin 300 mg every 6 and brought him in for a quick check. He has an open wound between his first and second toes on the right foot as a potential source. 04/04/18; ooRight foot cellulitis is resolving he is completing clindamycin. This is truly good news ooLeft lateral calf wound which is initial wound only has one small open area inferiorly this is close to healing out. He has compression stockings. We will use Hydrofera Blue right down to the epithelialization of this ooNonviable surface on the left heel which was initially pressure with a DTI. We've been using Hydrofera Blue. I'm going to switch this back to silver alginate ooLeft first second toe/tinea pedis this looks better using silver alginate ooRight first second toe tinea pedis using silver alginate ooLarge pressure ulcers on theLeft ischial tuberosity. Small wound here Looks better. I am uncertain about the surface over the large wound. Using silver alginate 04/11/18; ooCellulitis in the right foot is resolved ooLeft lateral calf wound which was his original wounds still has 2 tiny open areas  remaining this is just about closed ooNonviable surface on the left heel is better but still requires debridement ooLeft first second toe/tinea pedis still open using silver alginate ooRight first second toe wound tinea pedis I asked him to go back to using ketoconazole and silver alginate ooLarge pressure ulcers on the left ischial tuberosity this shear injury here is resolved. Wound is smaller. No evidence of infection using silver alginate 04/18/18; ooPatient arrives with an intense area of cellulitis in the right mid lower calf extending into the right heel area. Bright red and warm. Smaller area on the left anterior leg. He has a significant history of MRSA. He will definitely need antibioticsoodoxycycline ooHe now has 2 open areas on the left ischial tuberosity the original large wound and now a satellite area which I think was above his initial satellite areas. Not a wonderful surface on this satellite area surrounding erythema which looks like pressure related. ooHis left lateral calf wound again his original wound is just about closed ooLeft heel pressure injury still requiring debridement ooLeft first second toe looks a lot better using silver alginate ooRight first second toe also using silver alginate and ketoconazole cream also looks better 04/20/18; the patient was worked in early today out of concerns with his cellulitis on the right leg. I had started him on doxycycline. This was 2 days ago. His wife was concerned about the swelling in the area. Also concerned about the left buttock. He has not been systemically unwell no fever chills. No nausea vomiting or diarrhea 04/25/18; the patient's left buttock wound is continued to deteriorate he is using Hydrofera Blue. He is still completing clindamycin for the cellulitis on the right leg although all of this looks better. 05/02/18 ooLeft buttock wound still with a lot of drainage and a very tightly adherent fibrinous necrotic  surface. He has a deeper area superiorly ooThe left lateral calf wound is still closed ooDTI wound on the left heel necrotic surface especially the circumference using Iodoflex ooAreas between his left first second toe and right first second toe both look  better. Dorsally and the right first second toe he had a necrotic surface although at smaller. In using silver alginate and ketoconazole. I did a culture last week which was a deep tissue culture of the reminiscence of the open wound on the right first second toe dorsally. This grew a few Acinetobacter and a few methicillin-resistant staph aureus. Nevertheless the area actually this week looked better. I didn't feel the need to specifically address this at least in terms of systemic antibiotics. 05/09/18; wounds are measuring larger more drainage per our intake. We are using Santyl covered with alginate on the large superficial buttock wounds, Iodosorb on the left heel, ketoconazole and silver alginate to the dorsal first and second toes bilaterally. 05/16/18; ooThe area on his left buttock better in some aspects although the area superiorly over the ischial tuberosity required an extensive debridement.using Santyl ooLeft heel appears stable. Using Iodoflex ooThe areas between his first and second toes are not bad however there is spreading erythema up the dorsal aspect of his left foot this looks like cellulitis again. He is insensate the erythema is really very brilliant.o Erysipelas He went to see an allergist days ago because he was itching part of this he had lab work done. This showed a white count of 15.1 with 70% neutrophils. Hemoglobin of 11.4 and a platelet count of 659,000. Last white count we had in Epic was a 2-1/2 years ago which was 25.9 but he was ill at the time. He was able to show me some lab work that was done by his primary physician the pattern is about the same. 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 But prompted me to go ahead and do x-rays of both feet and the pelvis rule out osteomyelitis. He also had a comprehensive metabolic panel this was reasonably normal his albumin was 3.7 liver function tests BUN/creatinine all normal 05/23/18; x-rays of both his feet from last week were negative for underlying pulmonary abnormality. The x-ray of his pelvis however showed mild irregularity in the left ischial which may represent some early osteomyelitis. The wound in the left ischial continues to get deeper clearly now exposed muscle. Each week necrotic surface material over this area. Whereas the rest of the wounds do not look so bad. ooThe left ischial wound we have been using Santyl and calcium alginate ooT the left heel surface necrotic debris using Iodoflex o ooThe left lateral leg is still healed ooAreas on the left dorsal foot and the right dorsal foot are about the same. There is some inflammation on the left which might represent contact dermatitis, fungal dermatitis I am doubtful cellulitis although this looks better than last week 05/30/18; CT scan done at Hospital did not show any osteomyelitis or abscess. Suggested the possibility of underlying cellulitis although I don't see a lot of evidence of this at the bedside ooThe wound itself on the left buttock/upper thigh actually looks somewhat better. No debridement ooLeft heel also looks better no debridement continue Iodoflex ooBoth dorsal first second toe spaces appear better using Lotrisone. Left still required debridement 06/06/18; ooIntake reported some purulent looking drainage from the left gluteal wound. Using Santyl and calcium alginate ooLeft heel looks better although still a nonviable surface requiring debridement ooThe left dorsal foot first/second webspace actually expanding and somewhat deeper. I may consider doing a shave biopsy of this area ooRight dorsal foot first/second  webspace appears stable to improved. Using Lotrisone and silver alginate  to both these areas 06/13/18 ooLeft gluteal surface looks better. Now separated in the 2 wounds. No debridement required. Still drainage. We'll continue silver alginate ooLeft heel continues to look better with Iodoflex continue this for at least another week ooOf his dorsal foot wounds the area on the left still has some depth although it looks better than last week. We've been using Lotrisone and silver alginate 06/20/18 ooLeft gluteal continues to look better healthy tissue ooLeft heel continues to look better healthy granulation wound is smaller. He is using Iodoflex and his long as this continues continue the Iodoflex ooDorsal right foot looks better unfortunately dorsal left foot does not. There is swelling and erythema of his forefoot. He had minor trauma to this several days ago but doesn't think this was enough to have caused any tissue injury. Foot looks like cellulitis, we have had this problem before 06/27/18 on evaluation today patient appears to be doing a little worse in regard to his foot ulcer. 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 Both of which would really be his only options for treating this infection. In the past he has been given and effusion of Orbactiv. This is done very well for him in the past again it's one time dosing IV antibiotic therapy. Subsequently I do believe this is something we're gonna need to see about doing at this point in time. Currently his other wounds seem to be doing somewhat better in my pinion I'm pretty happy in that regard. 07/03/18 on evaluation today patient's wounds actually appear to be doing fairly well. He has been tolerating the dressing changes without complication. All in all he seems to be showing signs of improvement. In  regard to the antibiotics he has been dealing with infectious disease since I saw him last week as far as getting this scheduled. In the end [facility]'s [facility] this coming Friday. In the meantime he has been continuing to perform the dressing changes in such as previous. There does not appear to be any evidence of infection worsengin at this time. 07/10/18; ooSince I last saw this man 2 weeks ago things have actually improved. IV antibiotics of resulted in less forefoot erythema although there is still some present. He is not systemically unwell ooLeft buttock wounds o2 now have no depth there is increased epithelialization Using silver alginate ooLeft heel still requires debridement using Iodoflex ooLeft dorsal foot still with a sizable wound about the size of a border but healthy granulation ooRight dorsal foot still with a slitlike area using silver alginate 07/18/18; the patient's cellulitis in the left foot is improved in fact I think it is on its way to resolving. ooLeft buttock wounds o2 both look better although the larger one has hypertension granulation we've been using silver alginate ooLeft heel has some thick circumferential redundant skin over the wound edge which will need to be removed today we've been using Iodoflex ooLeft dorsal foot is still a sizable wound required debridement using silver alginate ooThe right dorsal foot is just about closed only a small open area remains here 07/25/18; left foot cellulitis is resolved ooLeft buttock wounds o2 both look better. Hyper-granulation on the major area ooLeft heel as some debris over the surface but otherwise looks a healthier wound. Using silver collagen ooRight dorsal  foot is just about closed 07/31/18; arrives with our intake nurse worried about purulent drainage from the buttock. We had hyper-granulation here last week ooHis buttock wounds o2 continue to look  better ooLeft heel some debris over the surface but measuring smaller. ooRight dorsal foot unfortunately has openings between the toes ooLeft foot superficial wound looks less aggravated. 08/07/18 ooButtock wounds continue to look better although some of her granulation and the larger medial wound. silver alginate ooLeft heel continues to look a lot better.silver collagen ooLeft foot superficial wound looks less stable. Requires debridement. He has a new wound superficial area on the foot on the lateral dorsal foot. ooRight foot looks better using silver alginate without Lotrisone 08/14/2018; patient was in the ER last week diagnosed with a UTI. He is now on Cefpodoxime and Macrodantin. ooButtock wounds continued to be smaller. Using silver alginate ooLeft heel continues to look better using silver collagen ooLeft foot superficial wound looks as though it is improving ooRight dorsal foot area is just about healed. 08/21/2018; patient is completed his antibiotics for his UTI. ooHe has 2 open areas on the buttocks. There is still not closed although the surface looks satisfactory. Using silver alginate ooLeft heel continues to improve using silver collagen ooThe bilateral dorsal foot areas which are at the base of his first and second toes/possible tinea pedis are actually stable on the left but worse on the right. The area on the left required debridement of necrotic surface. After debridement I obtained a specimen for PCR culture. ooThe right dorsal foot which is been just about healed last week is now reopened 08/28/2018; culture done on the left dorsal foot showed coag negative staph both staph epidermidis and Lugdunensis. I think this is worthwhile initiating systemic treatment. I will use doxycycline given his long list of allergies. The area on the left heel slightly improved but still requiring debridement. ooThe large wound on the buttock is just about closed whereas the  smaller one is larger. Using silver alginate in this area 09/04/2018; patient is completing his doxycycline for the left foot although this continues to be a very difficult wound area with very adherent necrotic debris. We are using silver alginate to all his wounds right foot left foot and the small wounds on his buttock, silver collagen on the left heel. 09/11/2018; once again this patient has intense erythema and swelling of the left forefoot. Lesser degrees of erythema in the right foot. He has a long list of allergies and intolerances. I will reinstitute doxycycline. 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 Using silver alginate ooLeft heel also looks better using silver collagen ooUnfortunately both the areas on his feet look worse. The area on the left first second webspace is now gone through to the plantar part of his foot. The area on the left foot anteriorly is irritated with erythema and swelling in the forefoot. 09/25/2018 ooHis wound on the left plantar heel looks better. Using silver collagen ooThe area on the left buttock 2 small remnant areas. One is closed one is still open. Using silver alginate ooThe areas between both his first and second toes look worse. This in spite of long-standing antifungal therapy with ketoconazole and silver alginate which should have antifungal activity ooHe has small areas around his original wound on the left calf one is on the bottom of the original scar tissue and one superiorly both of these are small and superficial but again  given wound history in this site this is worrisome 10/02/2018 ooLeft plantar heel continues to gradually contract using silver collagen ooLeft buttock wound is unchanged using silver alginate ooThe areas on his dorsal feet between his first and second toes bilaterally look about the same. 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 Small open area here. We have been using silver alginate 10/09/2018; ooLeft plantar heel continues to look healthy and contract. No debridement is required ooLeft buttock slightly smaller with a tape injury wound just below which was new this week ooDorsal feet somewhat improved I have been using clindamycin ooLeft lateral looks lower extremity the actual open area looks worse although a lot of this is epithelialized. I am going to change to silver collagen today He has a lot more swelling in the right leg although this is not pitting not red and not particularly warm there is a lot of spasm in the right leg usually indicative of people with paralysis of some underlying discomfort. We have reviewed his vascular status from 2017 he had a left greater saphenous vein ablation. I wonder about referring him back to vascular surgery if the area on the left leg continues to deteriorate. 10/16/2018 in today for follow-up and management of multiple lower extremity ulcers. His left Buttock wound is much lower smaller and almost closed completely. The wound to the left ankle has began to reopen with Epithelialization and some adherent slough. He has multiple new areas to the left foot and leg. The left dorsal foot without much improvement. Wound present between left great webspace and 2nd toe. Erythema and edema present right leg. Right LE ultrasound obtained on 10/10/18 was negative for DVT . 10/23/2018; ooLeft buttock is closed over. Still dry macerated skin but there is no open wound. I suspect this is chronic pressure/moisture ooLeft lateral calf is quite a bit worse than when I saw this last. There is clearly drainage here he has macerated skin into the left plantar heel. We will change the primary dressing to alginate ooLeft dorsal foot has some  improvement in overall wound area. Still using clindamycin and silver alginate ooRight dorsal foot about the same as the left using clindamycin and silver alginate ooThe erythema in the right leg has resolved. He is DVT rule out was negative ooLeft heel pressure area required debridement although the wound is smaller and the surface is health 10/26/2018 ooThe patient came back in for his nurse check today predominantly because of the drainage coming out of the left lateral leg with a recent reopening of his original wound on the left lateral calf. He comes in today with a large amount of surrounding erythema around the wound extending from the calf into the ankle and even in the area on the dorsal foot. He is not systemically unwell. He is not febrile. Nevertheless this looks like cellulitis. We have been using silver alginate to the area. I changed him to a regular visit and I am going to prescribe him doxycycline. The rationale here is a long list of medication intolerances and a history of MRSA. I did not see anything that I thought would provide a valuable culture 10/30/2018 ooFollow-up from his appointment 4 days ago with really an extensive area of cellulitis in the left calf left lateral ankle and left dorsal foot. I put him on doxycycline.  He has a long list of medication allergies which are true allergy reactions. 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 In any case he is a lot better today. The erythema especially in the anterior and lateral left calf is better. He still has left ankle erythema. He also is complaining about increasing edema in the right leg we have only been using Kerlix Coban and he has been doing the wraps at home. Finally he has a spotty rash on the medial part of his upper left calf which looks like folliculitis or perhaps wrap occlusion type injury. Small superficial macules not  pustules 11/06/18 patient arrives today with again a considerable degree of erythema around the wound on the left lateral calf extending into the dorsal ankle and dorsal foot. This is a lot worse than when I saw this last week. He is on doxycycline really with not a lot of improvement. He has not been systemically unwell Wounds on the; left heel actually looks improved. Original area on the left foot and proximity to the first and second toes looks about the same. He has superficial areas on the dorsal foot, anterior calf and then the reopening of his original wound on the left lateral calf which looks about the same ooThe only area he has on the right is the dorsal webspace first and second which is smaller. ooHe has a large area of dry erythematous skin on the left buttock small open area here. 11/13/2018; the patient arrives in much better condition. The erythema around the wound on the left lateral calf is a lot better. Not sure whether this was the clindamycin or the TCA and ketoconazole or just in the improvement in edema control [stasis dermatitis]. In any case this is a lot better. The area on the left heel is very small and just about resolved using silver collagen we have been using silver alginate to the areas on his dorsal feet 11/20/2018; his wounds include the left lateral calf, left heel, dorsal aspects of both feet just proximal to the first second webspace. He is stable to slightly improved. I did not think any changes to his dressings were going to be necessary 11/27/2018 he has a reopening on the left buttock which is surrounded by what looks like tinea or perhaps some other form of dermatitis. The area on the left dorsal foot has some erythema around it I have marked this area but I am not sure whether this is cellulitis or not. Left heel is not closed. Left calf the reopening is really slightly longer and probably worse 1/13; in general things look better and smaller except for  the left dorsal foot. Area on the left heel is just about closed, left buttock looks better only a small wound remains in the skin looks better [using Lotrisone] 1/20; the area on the left heel only has a few remaining open areas here. Left lateral calf about the same in terms of size, left dorsal foot slightly larger right lateral foot still not closed. The area on the left buttock has no open wound and the surrounding skin looks a lot better 1/27; the area on the left heel is closed. Left lateral calf better but still requiring extensive debridements. The area on his left buttock is closed. He still has the open areas on the left dorsal foot which is slightly smaller in the right foot which is slightly expanded. We have been using Iodoflex  on these areas as well 2/3; left heel is closed. Left lateral calf still requiring debridement using Iodoflex there is no open area on his left buttock however he has dry scaly skin over a large area of this. Not really responding well to the Lotrisone. Finally the areas on his dorsal feet at the level of the first second webspace are slightly smaller on the right and about the same on the left. Both of these vigorously debrided with Anasept and gauze 2/10; left heel remains closed he has dry erythematous skin over the left buttock but there is no open wound here. Left lateral leg has come in and with. Still requiring debridement we have been using Iodoflex here. Finally the area on the left dorsal foot and right dorsal foot are really about the same extremely dry callused fissured areas. He does not yet have a dermatology appointment 2/17; left heel remains closed. He has a new open area on the left buttock. The area on the left lateral calf is bigger longer and still covered in necrotic debris. No major change in his foot areas bilaterally. I am awaiting for a dermatologist to look on this. We have been using ketoconazole I do not know that this is been doing any  good at all. 2/24; left heel remains closed. The left buttock wound that was new reopening last week looks better. The left lateral calf appears better also although still requires debridement. The major area on his foot is the left first second also requiring debridement. We have been putting Prisma on all wounds. I do not believe that the ketoconazole has done too much good for his feet. He will use Lotrisone I am going to give him a 2-week course of terbinafine. We still do not have a dermatology appointment 3/2 left heel remains closed however there is skin over bone in this area I pointed this out to him today. The left buttock wound is epithelialized but still does not look completely stable. The area on the left leg required debridement were using silver collagen here. With regards to his feet we changed to Lotrisone last week and silver alginate. 3/9; left heel remains closed. Left buttock remains closed. The area on the right foot is essentially closed. The left foot remains unchanged. Slightly smaller on the left lateral calf. Using silver collagen to both of these areas 3/16-Left heel remains closed. Area on right foot is closed. Left lateral calf above the lateral malleolus open wound requiring debridement with easy bleeding. Left dorsal wound proximal to first toe also debrided. Left ischial area open new. Patient has been using Prisma with wrapping every 3 days. Dermatology appointment is apparently tomorrow.Patient has completed his terbinafine 2-week course with some apparent improvement according to him, there is still flaking and dry skin in his foot on the left 3/23; area on the right foot is reopened. The area on the left anterior foot is about the same still a very necrotic adherent surface. He still has the area on the left leg and reopening is on the left buttock. He apparently saw dermatology although I do not have a note. According to the patient who is usually fairly well  informed they did not have any good ideas. Put him on oral terbinafine which she is been on before. 3/30; using silver collagen to all wounds. Apparently his dermatologist put him on doxycycline and rifampin presumably some culture grew staph. I do not have this result. He remains on terbinafine although I have  used terbinafine on him before 4/6; patient has had a fairly substantial reopening on the right foot between the first and second toes. He is finished his terbinafine and I believe is on doxycycline and rifampin still as prescribed by dermatology. We have been using silver collagen to all his wounds although the patient reports that he thinks silver alginate does better on the wounds on his buttock. 4/13; the area on his left lateral calf about the same size but it did not require debridement. ooLeft dorsal foot just proximal to the webspace between the first and second toes is about the same. Still nonviable surface. I note some superficial bronze discoloration of the dorsal part of his foot ooRight dorsal foot just proximal to the first and second toes also looks about the same. I still think there may be the same discoloration I noted above on the left ooLeft buttock wound looks about the same 4/20; left lateral calf appears to be gradually contracting using silver collagen. 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 The left dorsal foot wound is debrided of tightly adherent necrotic debris and really cleans up quite nicely. The right area is worse with expansion. I did not debride this it is now over the base of the second toe ooThe area on his left buttock is smaller no debridement is required using silver collagen 5/4; left calf continues to make good progress. ooHe arrives with erythema around the wounds on his dorsal foot which even extends to the plantar aspect. Very concerning for coexistent infection. He is finished  the erythromycin I gave him for possible erythrasma this does not seem to have helped. ooThe area on the left foot is about the same base of the dorsal toes ooIs area on the buttock looks improved on the left 5/11; left calf and left buttock continued to make good progress. Left foot is about the same to slightly improved. ooMajor problem is on the right foot. He has not had an x-ray. Deep tissue culture I did last week showed both Enterobacter and E. coli. I did not change the doxycycline I put him on empirically although neither 1 of these were plated to doxycycline. He arrives today with the erythema looking worse on both the dorsal and plantar foot. Macerated skin on the bottom of the foot. he has not been systemically unwell 5/18-Patient returns at 1 week, left calf wound appears to be making some progress, left buttock wound appears slightly worse than last time, left foot wound looks slightly better, right foot redness is marginally better. X-ray of both feet show no air or evidence of osteomyelitis. Patient is finished his Omnicef and terbinafine. He continues to have macerated skin on the bottom of the left foot as well as right 5/26; left calf wound is better, left buttock wound appears to have multiple small superficial open areas with surrounding macerated skin. X-rays that I did last time showed no evidence of osteomyelitis in either foot. He is finished cefdinir and doxycycline. I do not think that he was on terbinafine. He continues to have a large superficial open area on the right foot anterior dorsal and slightly between the first and second toes. I did send him to dermatology 2 months ago or so wondering about whether they would do a fungal scraping. I do not believe they did but did do a culture. We have been using silver alginate to the toe areas, he has been using antifungals at home topically  either ketoconazole or Lotrisone. We are using silver collagen on the left foot,  silver alginate on the right, silver collagen on the left lateral leg and silver alginate on the left buttock 6/1; left buttock area is healed. We have the left dorsal foot, left lateral leg and right dorsal foot. We are using silver alginate to the areas on both feet and silver collagen to the area on his left lateral calf 6/8; the left buttock apparently reopened late last week. He is not really sure how this happened. He is tolerating the terbinafine. Using silver alginate to all wounds 6/15; left buttock wound is larger than last week but still superficial. ooCame in the clinic today with a report of purulence from the left lateral leg I did not identify any infection ooBoth areas on his dorsal feet appear to be better. He is tolerating the terbinafine. Using silver alginate to all wounds 6/22; left buttock is about the same this week, left calf quite a bit better. His left foot is about the same however he comes in with erythema and warmth in the right forefoot once again. Culture that I gave him in the beginning of May showed Enterobacter and E. coli. I gave him doxycycline and things seem to improve although neither 1 of these organisms was specifically plated. 6/29; left buttock is larger and dry this week. Left lateral calf looks to me to be improved. Left dorsal foot also somewhat improved right foot completely unchanged. The erythema on the right foot is still present. He is completing the Ceftin dinner that I gave him empirically [see discussion above.) 7/6 - All wounds look to be stable and perhaps improved, the left buttock wound is slightly smaller, per patient bleeds easily, completed ceftin, the right foot redness is less, he is on terbinafine 7/13; left buttock wound about the same perhaps slightly narrower. Area on the left lateral leg continues to narrow. Left dorsal foot slightly smaller right foot about the same. We are using silver alginate on the right foot and Hydrofera  Blue to the areas on the left. Unna boot on the left 2 layer compression on the right 7/20; left buttock wound absolutely the same. Area on lateral leg continues to get better. Left dorsal foot require debridement as did the right no major change in the 7/27; left buttock wound the same size necrotic debris over the surface. The area on the lateral leg is closed once again. His left foot looks better right foot about the same although there is some involvement now of the posterior first second toe area. He is still on terbinafine which I have given him for a month, not certain a centimeter major change 06/25/19-All wounds appear to be slightly improved according to report, left buttock wound looks clean, both foot wounds have minimal to no debris the right dorsal foot has minimal slough. We are using Hydrofera Blue to the left and silver alginate to the right foot and ischial wound. 8/10-Wounds all appear to be around the same, the right forefoot distal part has some redness which was not there before, however the wound looks clean and small. Ischial wound looks about the same with no changes 8/17; his wound on the left lateral calf which was his original chronic venous insufficiency wound remains closed. Since I last saw him the areas on the left dorsal foot right dorsal foot generally appear better but require debridement. The area on his left initial tuberosity appears somewhat larger to me perhaps hyper  granulated and bleeds very easily. We have been using Hydrofera Blue to the left dorsal foot and silver alginate to everything else 8/24; left lateral calf remains closed. The areas on his dorsal feet on the webspace of the first and second toes bilaterally both look better. The area on the left buttock which is the pressure ulcer stage II slightly smaller. I change the dressing to Hydrofera Blue to all areas 8/31; left lateral calf remains closed. The area on his dorsal feet bilaterally look  better. Using Hydrofera Blue. Still requiring debridement on the left foot. No change in the left buttock pressure ulcers however 9/14; left lateral calf remains closed. Dorsal feet look quite a bit better than 2 weeks ago. Flaking dry skin also a lot better with the ammonium lactate I gave him 2 weeks ago. The area on the left buttock is improved. He states that his Roho cushion developed a leak and he is getting a new one, in the interim he is offloading this vigorously 9/21; left calf remains closed. Left heel which was a possible DTI looks better this week. He had macerated tissue around the left dorsal foot right foot looks satisfactory and improved left buttock wound. I changed his dressings to his feet to silver alginate bilaterally. Continuing Hydrofera Blue on the left buttock. 9/28 left calf remains closed. Left heel did not develop anything [possible DTI] dry flaking skin on the left dorsal foot. Right foot looks satisfactory. Improved left buttock wound. We are using silver alginate on his feet Hydrofera Blue on the buttock. I have asked him to go back to the Lotrisone on his feet including the wounds and surrounding areas 10/5; left calf remains closed. The areas on the left and right feet about the same. A lot of this is epithelialized however debris over the remaining open areas. He is using Lotrisone and silver alginate. The area on the left buttock using Hydrofera Blue 10/26. Patient has been out for 3 weeks secondary to Covid concerns. He tested negative but I think his wife tested positive. He comes in today with the left foot substantially worse, right foot about the same. Even more concerning he states that the area on his left buttock closed over but then reopened and is considerably deeper in one aspect than it was before [stage III wound] 11/2; left foot really about the same as last week. Quarter sized wound on the dorsal foot just proximal to the first second toes.  Surrounding erythema with areas of denuded epithelium. This is not really much different looking. Did not look like cellulitis this time however. ooRight foot area about the same.. We have been using silver alginate alginate on his toes ooLeft buttock still substantial irritated skin around the wound which I think looks somewhat better. We have been using Hydrofera Blue here. 11/9; left foot larger than last week and a very necrotic surface. Right foot I think is about the same perhaps slightly smaller. Debris around the circumference also addressed. Unfortunately on the left buttock there is been a decline. Satellite lesions below the major wound distally and now a an additional one posteriorly we have been using Hydrofera Blue but I think this is a pressure issue 11/16; left foot ulcer dorsally again a very adherent necrotic surface. Right foot is about the same. Not much change in the pressure ulcer on his left buttock. 11/30; left foot ulcer dorsally basically the same as when I saw him 2 weeks ago. Very adherent fibrinous debris on  the wound surface. Patient reports a lot of drainage as well. 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 We have been using Iodoflex, patient changed back to alginate because of the drainage. Area on his right dorsal foot really looks benign with a healthier surface certainly a lot better than on the left. Left buttock wounds all improved using Hydrofera Blue 12/7; left dorsal foot again no improvement. Tightly adherent debris. PCR culture I did last week only showed likely skin contaminant. I have gone ahead and done a punch biopsy of this which is about the last thing in terms of investigations I can think to do. He has known venous insufficiency and venous hypertension and this could be the issue here. The area on the right foot is about the same left buttock slightly worse according to our intake nurse secondary to  [facility] Blue sticking to the wound 12/14; biopsy of the left foot that I did last time showed changes that could be related to wound healing/chronic stasis dermatitis phenomenon no neoplasm. We have been using silver alginate to both feet. I change the one on the left today to Sorbact and silver alginate to his other 2 wounds 12/28; the patient arrives with the following problems; ooMajor issue is the dorsal left foot which continues to be a larger deeper wound area. Still with a completely nonviable surface ooParadoxically the area mirror image on the right on the right dorsal foot appears to be getting better. ooHe had some loss of dry denuded skin from the lower part of his original wound on the left lateral calf. Some of this area looked a little vulnerable and for this reason we put him in wrap that on this side this week ooThe area on his left buttock is larger. He still has the erythematous circular area which I think is a combination of pressure, sweat. This does not look like cellulitis or fungal dermatitis 11/26/2019; -Dorsal left foot large open wound with depth. Still debris over the surface. Using Sorbact ooThe area on the dorsal right foot paradoxically has closed over [facility] has a reopening on the left ankle laterally at the base of his original wound that extended up into the calf. This appears clean. ooThe left buttock wound is smaller but with very adherent necrotic debris over the surface. We have been using silver alginate here as well The patient had arterial studies done in 2017. He had biphasic waveforms at the dorsalis pedis and posterior tibial bilaterally. ABI in the left was 1.17. Digit waveforms were dampened. He has slight spasticity in the great toes I do not think a TBI would be possible 1/11; the patient comes in today with a sizable reopening between the first and second toes on the right. This is not exactly in the same location where we have been treating  wounds previously. According to our intake nurse this was actually fairly deep but 0.6 cm. The area on the left dorsal foot looks about the same the surface is somewhat cleaner using Sorbact, his MRI is in 2 days. We have not managed yet to get arterial studies. The new reopening on the left lateral calf looks somewhat better using alginate. The left buttock wound is about the same using alginate 1/18; the patient had his ARTERIAL studies which were quite normal. ABI in the right at 1.13 with triphasic/biphasic waveforms on the left ABI 1.06 again with triphasic/biphasic waveforms. It would not have been possible to have done  a toe brachial index because of spasticity. We have been using Sorbac to the left foot alginate to the rest of his wounds on the right foot left lateral calf and left buttock 1/25; arrives in clinic with erythema and swelling of the left forefoot worse over the first MTP area. This extends laterally dorsally and but also posteriorly. Still has an area on the left lateral part of the lower part of his calf wound it is eschared and clearly not closed. ooArea on the left buttock still with surrounding irritation and erythema. ooRight foot surface wound dorsally. The area between the right and first and second toes appears better. 2/1; ooThe left foot wound is about the same. Erythema slightly better I gave him a week of doxycycline empirically ooRight foot wound is more extensive extending between the toes to the plantar surface ooLeft lateral calf really no open surface on the inferior part of his original wound however the entire area still looks vulnerable ooAbsolutely no improvement in the left buttock wound required debridement. 2/8; the left foot is about the same. Erythema is slightly improved I gave him clindamycin last week. ooRight foot looks better he is using Lotrimin and silver alginate ooHe has a breakdown in the left lateral calf. Denuded epithelium which I  have removed ooLeft buttock about the same were using Hydrofera Blue 2/15; left foot is about the same there is less surrounding erythema. Surface still has tightly adherent debris which I have debriding however not making any progress ooRight foot has a substantial wound on the medial right second toe between the first and second webspace. ooStill an open area on the left lateral calf distal area. ooButtock wound is about the same 2/22; left foot is about the same less surrounding erythema. Surface has adherent debris. Polymen Ag Right foot area significant wound between the first and second toes. We have been using silver alginate here Left lateral leg polymen Ag at the base of his original venous insufficiency wound ooLeft buttock some improvement here 3/1; ooRight foot is deteriorating in the first second toe webspace. Larger and more substantial. We have been using silver alginate. ooLeft dorsal foot about the same markedly adherent surface debris using PolyMem Ag ooLeft lateral calf surface debris using PolyMem AG ooLeft buttock is improved again using PolyMem Ag. ooHe is completing his terbinafine. The erythema in the foot seems better. He has been on this for 2 weeks 3/8; no improvement in any wound area in fact he has a small open area on the dorsal midfoot which is new this week. He has not gotten his foot x-rays yet 3/15; his x-rays were both negative for osteomyelitis of both feet. No major change in any of his wounds on the extremities however his buttock wounds are better. We have been using polymen on the buttocks, left lower leg. Iodoflex on the left foot and silver alginate on the right 3/22; arrives in clinic today with the 2 major issues are the improvement in the left dorsal foot wound which for once actually looks healthy with a nice healthy wound surface without debridement. Using Iodoflex here. Unfortunately on the left lateral calf which is in the distal part of  his original wound he came to the clinic here for there was purulent drainage noted some increased breakdown scattered around the original area and a small area proximally. We we are using polymen here will change to silver alginate today. His buttock wound on the left is better and I think the  area on the right first second toe webspace is also improved 3/29; left dorsal foot looks better. Using Iodoflex. Left ankle culture from deterioration last time grew E. coli, Enterobacter and Enterococcus. I will give him a course of cefdinir although that will not cover Enterococcus. The area on the right foot in the webspace of the first and second toe lateral first toe looks better. The area on his buttock is about healed Vascular appointment is on April 21. This is to look at his venous system vis--vis continued breakdown of the wounds on the left including the left lateral leg and left dorsal foot he. He has had previous ablations on this side 4/5; the area between the right first and second toes lateral aspect of the first toe looks better. Dorsal aspect of the left first toe on the left foot also improved. Unfortunately the left lateral lower leg is larger and there is a second satellite wound superiorly. The usual superficial abrasions on the left buttock overall better but certainly not closed 4/12; the area between the right first and second toes is improved. Dorsal aspect of the left foot also slightly smaller with a vibrant healthy looking surface. No real change in the left lateral leg and the left buttock wound is healed He has an unaffordable co-pay for Apligraf. Appointment with vein and vascular with regards to the left leg venous part of the circulation is on 4/21 4/19; we continue to see improvement in all wound areas. Although this is minor. He has his vascular appointment on 4/21. The area on the left buttock has not reopened although right in the center of this area the skin looks  somewhat threatened 4/26; the left buttock is unfortunately reopened. In general his left dorsal foot has a healthy surface and looks somewhat smaller although it was not measured as such. The area between his first and second toe webspace on the right as a small wound against the first toe. The patient saw vascular surgery. The real question I was asking was about the small saphenous vein on the left. He has previously ablated left greater saphenous vein. Nothing further was commented on on the left. Right greater saphenous vein without reflux at the saphenofemoral junction or proximal thigh there was no indication for ablation of the right greater saphenous vein duplex was negative for DVT bilaterally. They did not think there was anything from a vascular surgery point of view that could be offered. They ABIs within normal limits 5/3; only small open area on the left buttock. The area on the left lateral leg which was his original venous reflux is now 2 wounds both which look clean. We are using Iodoflex on the left dorsal foot which looks healthy and smaller. He is down to a very tiny area between the right first and second toes, using silver alginate 5/10; all of his wounds appear better. We have much better edema control in 4 layer compression on the left. This may be the factor that is allowing the left foot and left lateral calf to heal. He has external compression garments at home 04/14/20-All of his wounds are progressing well, the left forefoot is practically closed, left ischium appears to be about the same, right toe webspace is also smaller. The left lateral leg is about the same, continue using Hydrofera Blue to this, silver alginate to the ischium, Iodoflex to the toe space on the right 6/7; most of his wounds outside of the left buttock are doing well. The  area on the left lateral calf and left dorsal foot are smaller. The area on the right foot in between the first and second toe  webspace is barely visible although he still says there is some drainage here is the only reason I did not heal this out. ooUnfortunately the area on the left buttock almost looks like he has a skin tear from tape. He has open wound and then a large flap of skin that we are trying to get adherence over an area just next to the remaining wound 6/21; 2 week follow-up. I believe is been here for nurse visits. Miraculously the area between his first and second toes on the left dorsal foot is closed over. Still open on the right first second web space. The left lateral calf has 2 open areas. Distally this is more superficial. The proximal area had a little more depth and required debridement of adherent necrotic material. His buttock wound is actually larger we have been using silver alginate here 6/28; the patient's area on the left foot remains closed. Still open wet area between the first and second toes on the right and also extending into the plantar aspect. We have been using silver alginate in this location. He has 2 areas on the left lower leg part of his original long wounds which I think are better. We have been using Hydrofera Blue here. Hydrofera Blue to the left buttock which is stable 7/12; left foot remains closed. Left ankle is closed. May be a small area between his right first and second toes the only truly open area is on the left buttock. We have been using Hydrofera Blue here 7/19; patient arrives with marked deterioration especially in the left foot and ankle. We did not put him in a compression wrap on the left last week in fact he wore his juxta lite stockings on either side although he does not have an underlying stocking. He has a reopening on the left dorsal foot, left lateral ankle and a new area on the right dorsal ankle. More worrisome is the degree of erythema on the left foot extending on the lateral foot into the lateral lower leg on the left 7/26; the patient had erythema  and drainage from the lateral left ankle last week. Culture of this grew MRSA resistant to doxycycline and clindamycin which are the 2 antibiotics we usually use with this patient who has multiple antibiotic allergies including linezolid, trimethoprim sulfamethoxazole. I had give him an empiric doxycycline and he comes in the area certainly looks somewhat better although it is blotchy in his lower leg. He has not been systemically unwell. He has had areas on the left dorsal foot which is a reopening, chronic wounds on the left lateral ankle. Both of these I think are secondary to chronic venous insufficiency. The area between his first and second toes is closed as far as I can tell. He had a new wrap injury on the right dorsal ankle last week. Finally he has an area on the left buttock. We have been using silver alginate to everything except the left buttock we are using Hydrofera Blue 06/30/20-Patient returns at 1 week, has been given a sample dose pack of NUZYRA which is a tetracycline derivative [omadacycline], patient has completed those, we have been using silver alginate to almost all the wounds except the left ischium where we are using Hydrofera Blue all of them look better 8/16; since I last saw the patient he has been doing well.  The area on the left buttock, left lateral ankle and left foot are all closed today. He has completed the [place] I gave him last time and tolerated this well. He still has open areas on the right dorsal ankle and in the right first second toe area which we are using silver alginate. 8/23; we put him in his bilateral external compression stockings last week as he did not have anything open on either leg except for concerning area between the right first and second toe. He comes in today with an area on the left dorsal foot slightly more proximal than the original wound, the left lateral foot but this is actually a continuation of the area he had on the left lateral ankle  from last time. As well he is opened up on the left buttock again. 8/30; comes in today with things looking a lot better. The area on the left lower ankle has closed down as has the left foot but with eschar in both areas. The area on the dorsal right ankle is also epithelialized. Very little remaining of the left buttock wound. We have been using silver alginate on all wound areas 9/13; the area in the first second toe webspace on the right has fully epithelialized. He still has some vulnerable epithelium on the right and the ankle and the dorsal foot. He notes weeping. He is using his juxta lite stocking. On the left again the left dorsal foot is closed left lateral ankle is closed. We went to the juxta lite stocking here as well. ooStill vulnerable in the left buttock although only 2 small open areas remain here 9/27; 2-week follow-up. We did not look at his left leg but the patient says everything is closed. He is a bit disturbed by the amount of edema in his left foot he is using juxta lite stockings but asking about over the toes stockings which would be 30/40, will talk to him next time. According to him there is no open wound on either the left foot or the left ankle/calf He has an open area on the dorsal right calf which I initially point a wrap injury. He has superficial remaining wound on the left ischial tuberosity been using silver alginate although he says this sticks to the wound 10/5; we gave him 2-week follow-up but he called yesterday expressing some concerns about his right foot right ankle and the left buttock. He came in early. There is still no open areas on the left leg and that still in his juxta lite stocking 10/11; he only has 1 small area on the left buttock that remains measuring millimeters 1 mm. Still has the same irritated skin in this area. We recommended zinc oxide when this eventually closes and pressure relief is meticulously is he can do this. He still has an area  on the dorsal part of his right first through third toes which is a bit irritated and still open and on the dorsal ankle near the crease of the ankle. We have been using silver alginate and using his own stocking. He has nothing open on the left leg or foot 10/25; 2-week follow-up. Not nearly as good on the left buttock as I was hoping. For open areas with 5 looking threatened small. He has the erythematous irritated chronic skin in this area. oo1 area on the right dorsal ankle. He reports this area bleeds easily ooRight dorsal foot just proximal to the base of his toes ooWe have been using silver alginate.  11/8; 2-week follow-up. Left buttock is about the same although I do not think the wounds are in the same location we have been using silver alginate. I have asked him to use zinc oxide on the skin around the wounds. ooHe still has a small area on the right dorsal ankle he reports this bleeds easily ooRight dorsal foot just proximal to the base of the toes does not have anything open although the skin is very dry and scaly ooHe has a new opening on the nailbed of the left great toe. Nothing on the left ankle 11/29; 3-week follow-up. Left buttock has 2 open areas. And washing of these wounds today started bleeding easily. Suggesting very friable tissue. We have been using silver alginate. Right dorsal ankle which I thought was initially a wrap injury we have been using silver alginate. Nothing open between the toes that I can see. He states the area on the left dorsal toe nailbed healed after the last visit in 2 or 3 days 12/13; 3-week follow-up. His left buttock now has 3 open areas but the original 2 areas are smaller using polymen here. Surrounding skin looks better. The right dorsal ankle is closed. He has a small opening on the right dorsal foot at the level of the third toe. In general the skin looks better here. He is wearing his juxta lite stocking on the left leg says there is  nothing open 11/24/2020; 3 weeks follow-up. His left buttock still has the 3 open areas. We have been using polymen but due to lack of response he changed to [facility] area. Surrounding skin is dry erythematous and irritated looking. There is no evidence of infection either bacterial or fungal however there is loss of surface epithelium ooHe still has very dry skin in his foot causing irritation and erythema on the dorsal part of his toes. This is not responded to prolonged courses of antifungal simply looks dry and irritated 1/24; left buttock area still looks about the same he was unable to find the triad ointment that we had suggested. The area on the right lower leg just above the dorsal ankle has reopened and the areas on the right foot between the first second and second third toes and scaling on the bottom of the foot has been about the same for quite some time now. been using silver alginate to all wound areas 2/7; left buttock wound looked quite good although not much smaller in terms of surface area surrounding skin looks better. Only a few dry flaking areas on the right foot in between the first and second toes the skin generally looks better here [ammonium lactate]. Finally the area on the right dorsal ankle is closed 2/21; ooThere is no open area on the right foot even between the right first and second toe. Skin around this area dorsally and plantar aspects look better. ooHe has a reopening of the area on the right ankle just above the crease of the ankle dorsally. I continue to think that this is probably friction from spasms may be even this time with his stocking under the compression stockings. ooWounds on his left buttock look about the same there a couple of areas that have reopened. He has a total square area of loss of epithelialization. This does not look like infection it looks like a contact dermatitis but I just cannot determine to what 3/14; there is nothing on the  right foot between the first and second toes this was carefully inspected  under illumination. Some chronic irritation on the dorsal part of his foot from toes 1-3 at the base. Nothing really open here substantially. Still has an area on the right foot/ankle that is actually larger and hyper granulated. His buttock area on the left is just about closed however he has chronic inflammation with loss of the surface epithelial layer 3/28; 2-week follow-up. In clinic today with a new wound on the left anterior mid tibia. Says this happened about 2 weeks ago. He is not really sure how wonders about the spasticity of his legs at night whether that could have caused this other than that he does not have a good idea. He has been using topical antibiotics and silver alginate. The area on his right dorsal ankle seems somewhat better. ooFinally everything on his left buttock is closed. 4/11; 2-week follow-up. All of his wounds are better except for the area over the ischium and left buttock which have opened up widely again. At least part of this is covered in necrotic fibrinous material another part had rolled nonviable skin. The area on the right ankle, left anterior mid tibia are both a lot better. He had no open wounds on either foot including the areas between the first and second toes 4/25; patient presents for 2-week follow-up. He states that the wounds are overall stable. He has no complaints today and states he is using Hydrofera Blue to open wounds. 5/9; have not seen this man in over a month. For my memory he has open areas on the left mid tibia and right ankle. T oday he has new open area on the right dorsal foot which we have not had a problem with recently. He has the sustained area on the left buttock He is also changed his insurance at the beginning of the year [employer]. We will need prior authorizations for debridement 5/23; patient presents for 2-week follow-up. He has prior authorizations  for debridement. He denies any issues in the past 2 weeks with his wound care. He has been using Hydrofera Blue to all the wounds. He does report a circular rash to the upper left leg that is new. He denies acute signs of infection. 6/6; 2-week follow-up. The patient has open wounds on the left buttock which are worse than the last time I saw this about a month ago. He also has a new area to me on the left anterior mid tibia with some surrounding erythema. The area on the dorsal ankle on the right is closed but I think this will be a friction injury every time this area is exposed to either our wraps or his compression stockings caused by unrelenting spasms in this leg. 6/20; 2-week follow-up. oo The patient has open wounds on the left buttock which is about the same. Using [facility] here. - The left mid tibia has a static amount of surrounding erythema. Also a raised area in the center. We have been using Hydrofera Blue here. ooooFinally he has broken down in his dorsal right foot extending between the first and second toes and going to the base of the first and second toe webspace. I have previously assumed that this was severe venous hypertension 7/5; 2-week follow-up oo The left buttock wound actually looks better. We are using Hydrofera Blue. He has extensive skin irritation around this area and I have not really been able to get that any better. I have tried Lotrisone i.e. antifungals and steroids. More most recently we have just been using  Coloplast really looks about the same. oo The left mid tibia which was new last week culture to have very resistant staph aureus. Not only methicillin-resistant but doxycycline resistant. The patient has a plethora of antibiotic allergies including sulfa, linezolid. I used topical bacitracin on this but he has not started this yet. oo In addition he has an expanding area of erythema with a wound on the dorsal right foot. I did a deep tissue culture  of this area today 7/12; oo Left buttock area actually looks better surrounding skin also looks less irritated. oo Left mid tibia looks about the same. He is using bacitracin this is not worse oo Right dorsal foot looks about the same as well. oo The left first toe also looks about the same 7/19; left buttock wound continues to improve in terms of open areas oo Left mid tibia is still concerning amount of swelling he is using bacitracin oo Dorsal left first toe somewhat smaller oo Right dorsal foot somewhat smaller 7/25; left buttock wound actually continues to improve oo Left mid tibia area has less swelling. I gave him all my samples of new Nuzyra. This seems to have helped although the wound is still open it. His abrasion closed by here oo Left dorsal great toe really no better. Still a very nonviable surface oo Right dorsal foot perhaps some better. oo We have been using bacitracin and silver nitrate to the areas on his lower legs and Hydrofera Blue to the area on the buttock. 8/16 oo Disappointed that his left buttock wound is actually more substantial. Apparently during the last nurse visit these were both very small. He has continued irritation to a large area of skin on his buttock. I have never been able to totally explain this although I think it some combination of the way he sits, pressure, moisture. He is not incontinent enough to contribute to this. oo Left dorsal great toe still fibrinous debris on the surface that I have debrided today oo Large area across the dorsal right toes. oo The area on the left anterior mid tibia has less swelling. He completed the [place]. This does not look infected although the tissue is still fried 8/30; 2-week follow-up. oo Left buttock areas not improved. We used Hydrofera Blue on this. Weeping wet with the surrounding erythema that I have not been able to control even with Lotrisone and topical Coloplast oo Left dorsal great  toe looks about the same oo More substantial area again at the base of his toes on the left which is new this week. oo Area across the dorsal right toes looks improved oo The left anterior mid tibia looks like it is trying to close 9/13; 2-week follow-up. Using silver alginate on all of his wounds. The left dorsal foot does not look any better. He has the area on the dorsal toe and also the areas at the base of all of his toes 1 through 3. On the right foot he has a similar pattern in a similar area. He has the area on his left mid tibia that looks fairly healthy. Finally the area on his left buttock looks somewhat bette 9/20; culture I did of the left foot which was a deep tissue culture last time showed E. coli he has erythema around this wound. Still a completely necrotic surface. His right dorsal foot looks about the same. He has a very friable surface to the left anterior mid tibia. Both buttock wounds look better. We have been using  silver alginate to all wounds 10/4; he has completed the cephalexin that I gave him last time for the left foot. He is using topical gentamicin under silver alginate silver alginate being applied to all the wounds. Unfortunately all the wounds look irritated on his dorsal right foot dorsal left foot left mid tibia. I wonder if this could be a silver allergy. I am going to change him to [facility] on the lower extremity. The skin on the left buttock and left posterior thigh still flaking dry and irritated. This has continued no matter what I have applied topically to this. He has a solitary open wound which by itself does not look too bad however the entire area of surrounding skin does not change no matter what we have applied here 10/18; the area on the left dorsal foot and right dorsal foot both look better. The area on the right extends into the plantar but not between his toes. We have been using silver alginate. He still has a rectangular erythematous  area around the area on the left tibia. The wound itself is very small. Finally everything on his left buttock looks a little larger the skin is erythematous Objective Constitutional Sitting or standing Blood Pressure is within target range for patient.. Pulse regular and within target range for patient.Marland Kitchen Respirations regular, non-labored and within target range.. Temperature is normal and within the target range for the patient.Marland Kitchen Appears in no distress. Vitals Time Taken: 8:08 AM, Height: 70 in, Weight: 216 lbs, BMI: 31, Temperature: 98 F, Pulse: 106 bpm, Respiratory Rate: 16 breaths/min, Blood Pressure: 126/83 mmHg. General Notes: Wound exam oo Right dorsal foot the surface looks better here. Extends slightly to the plantar foot but everything between the toes as far as I can tell looks better. Left dorsal foot had surface slough we washed this off with saline and gauze. No erythema. oo On the left mid tibia just about eschared over however he has the same surrounding area which looks like contact dermatitis we are using Hydrofera Blue here oo The buttock wounds are larger 3 superficial areas. The usual large square shaped area of erythema with flaking denuded epithelium Integumentary (Hair, Skin) Wound #41R status is Open. Original cause of wound was Gradually Appeared. The date acquired was: 03/16/2020. The wound has been in treatment 77 weeks. The wound is located on the Left Ischium. The wound measures 5cm length x 4.5cm width x 0.1cm depth; 17.671cm^2 area and 1.767cm^3 volume. There is Fat Layer (Subcutaneous Tissue) exposed. There is no tunneling or undermining noted. There is a medium amount of sanguinous drainage noted. The wound margin is distinct with the outline attached to the wound base. There is large (67-100%) red, friable granulation within the wound bed. There is no necrotic tissue within the wound bed. Wound #51 status is Open. Original cause of wound was Trauma. The date  acquired was: 01/26/2021. The wound has been in treatment 29 weeks. The wound is located on the Left,Anterior Lower Leg. The wound measures 0.1cm length x 0.1cm width x 0.1cm depth; 0.008cm^2 area and 0.001cm^3 volume. There is Fat Layer (Subcutaneous Tissue) exposed. There is no tunneling or undermining noted. There is a medium amount of serosanguineous drainage noted. The wound margin is distinct with the outline attached to the wound base. There is large (67-100%) red, friable, hyper - granulation within the wound bed. There is no necrotic tissue within the wound bed. Wound #52 status is Open. Original cause of wound was Gradually Appeared. The  date acquired was: 03/27/2021. The wound has been in treatment 23 weeks. The wound is located on the Right,Dorsal Foot. The wound measures 1.8cm length x 4.4cm width x 0.1cm depth; 6.22cm^2 area and 0.622cm^3 volume. There is Fat Layer (Subcutaneous Tissue) exposed. There is no tunneling or undermining noted. There is a medium amount of purulent drainage noted. The wound margin is distinct with the outline attached to the wound base. There is medium (34-66%) pink granulation within the wound bed. There is a medium (34-66%) amount of necrotic tissue within the wound bed including Adherent Slough. Wound #54 status is Open. Original cause of wound was Pressure Injury. The date acquired was: 05/11/2021. The wound has been in treatment 17 weeks. The wound is located on the Left,Distal Ischium. The wound measures 1cm length x 1.5cm width x 0.1cm depth; 1.178cm^2 area and 0.118cm^3 volume. There is Fat Layer (Subcutaneous Tissue) exposed. There is no tunneling or undermining noted. There is a medium amount of sanguinous drainage noted. The wound margin is distinct with the outline attached to the wound base. There is large (67-100%) red, friable granulation within the wound bed. There is no necrotic tissue within the wound bed. Wound #55 status is Open. Original cause  of wound was Blister. The date acquired was: 05/26/2021. The wound has been in treatment 15 weeks. The wound is located on the Left T Great. The wound measures 0.2cm length x 0.8cm width x 0.3cm depth; 0.126cm^2 area and 0.038cm^3 volume. There is Fat Layer oe (Subcutaneous Tissue) exposed. There is no tunneling or undermining noted. There is a medium amount of purulent drainage noted. The wound margin is distinct with the outline attached to the wound base. There is medium (34-66%) pink granulation within the wound bed. There is a medium (34-66%) amount of necrotic tissue within the wound bed including Adherent Slough. Wound #56 status is Open. Original cause of wound was Gradually Appeared. The date acquired was: 07/11/2021. The wound has been in treatment 7 weeks. The wound is located on the Left,Dorsal Foot. The wound measures 3cm length x 2.5cm width x 0.1cm depth; 5.89cm^2 area and 0.589cm^3 volume. There is Fat Layer (Subcutaneous Tissue) exposed. There is no tunneling or undermining noted. There is a medium amount of purulent drainage noted. The wound margin is distinct with the outline attached to the wound base. There is small (1-33%) pink granulation within the wound bed. There is a large (67-100%) amount of necrotic tissue within the wound bed including Adherent Slough. Wound #57 status is Open. Original cause of wound was Gradually Appeared. The date acquired was: 08/25/2021. The wound has been in treatment 2 weeks. The wound is located on the [place]. The wound measures 0.8cm length x 0.3cm width x 0.1cm depth; 0.188cm^2 area and 0.019cm^3 volume. There is Fat Layer (Subcutaneous Tissue) exposed. There is no tunneling or undermining noted. There is a medium amount of purulent drainage noted. The wound margin is distinct with the outline attached to the wound base. There is medium (34-66%) red granulation within the wound bed. There is a medium (34- 66%) amount of necrotic tissue  within the wound bed including Adherent Slough. 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 - with Dr. Dellia Nims ****extra time 75 minutes*** Bathing/ Shower/ Hygiene: May shower and wash wound with soap and water. - on days that dressing is changed Edema Control - Lymphedema / SCD / Other: Elevate legs to the level of the heart or above for 30 minutes daily and/or when sitting, a frequency of: - throughout the day Compression stocking or Garment 30-40 mm/Hg pressure to: - Juxtalite to both legs daily Off-Loading: Roho cushion for wheelchair Turn and reposition every 2 hours WOUND #41R: - Ischium Wound Laterality: Left Cleanser: Soap and Water Every Other Day/30 Days Discharge Instructions: May shower and wash wound with dial antibacterial soap and water prior to dressing change. Topical: Triamcinolone Every Other Day/30 Days Discharge Instructions: Pick up cream from pharmacy. Apply Triamcinolone as directed Secondary Dressing: ABD Pad, 5x9 Every Other Day/30 Days Discharge Instructions: Apply over primary dressing as directed. Secured With: 16M Medipore H Soft Cloth Surgical T 4 x 2 (in/yd) Every Other Day/30 Days ape Discharge Instructions: Secure dressing with tape as directed. WOUND #51: - Lower Leg Wound Laterality: Left, Anterior Cleanser: Soap and Water Every Other Day/30 Days Discharge Instructions: May shower and wash wound with dial antibacterial soap and water prior to dressing change. Prim Dressing: Hydrofera Blue Ready  Foam, 4x5 in Every Other Day/30 Days ary Discharge Instructions: Apply to wound bed as instructed Secondary Dressing: Woven Gauze Sponge, Non-Sterile 4x4 in Every Other Day/30 Days Discharge Instructions: Apply over primary dressing as directed. Secondary Dressing: ABD Pad, 5x9 Every Other Day/30 Days Discharge Instructions: Apply over primary dressing as directed. Secured With: [employer], 4.5x3.1 (in/yd) Every Other Day/30 Days Discharge Instructions: Secure with Kerlix as directed. WOUND #52: - Foot Wound Laterality: Dorsal, Right Cleanser: Soap and Water Every Other Day/30 Days Discharge Instructions: May shower and wash wound with dial antibacterial soap and water prior to dressing change. Topical: Gentamicin Every Other Day/30 Days Discharge Instructions: As directed by physician Prim Dressing: Hydrofera Blue Ready Foam, 2.5 x2.5 in Every Other Day/30 Days ary Discharge Instructions: Apply to wound bed as instructed Secondary Dressing: Woven Gauze Sponge, Non-Sterile 4x4 in Every Other Day/30 Days Discharge Instructions: Apply over primary dressing as directed. Secured With: [employer], 4.5x3.1 (in/yd) Every Other Day/30 Days Discharge Instructions: Secure with Kerlix as directed. Secured With: Transpore Surgical T ape, 2x10 (in/yd) Every Other Day/30 Days Discharge Instructions: Secure dressing with tape as directed. WOUND #54: - Ischium Wound Laterality: Left, Distal Cleanser: Soap and Water Every Other Day/30 Days Discharge Instructions: May shower and wash wound with dial antibacterial soap and water prior to dressing change. Topical: Triamcinolone Every Other Day/30 Days Discharge Instructions: Pick up cream from pharmacy. Apply Triamcinolone as directed Secondary Dressing: ABD Pad, 5x9 Every Other Day/30 Days Discharge Instructions: Apply over primary dressing as directed. Secured With: 16M Medipore H Soft Cloth Surgical T 4 x 2 (in/yd) Every Other Day/30  Days ape Discharge Instructions: Secure dressing with tape as directed. WOUND #55: - T Great Wound Laterality: Left oe Cleanser: Soap and Water Every Other Day/30 Days Discharge Instructions: May shower and wash wound with dial antibacterial soap and water prior to dressing change. Prim Dressing: Hydrofera Blue Ready Foam, 2.5 x2.5 in Every Other Day/30 Days ary Discharge Instructions: Apply to wound bed as instructed Secondary Dressing: Woven Gauze Sponge, Non-Sterile 4x4 in Every Other Day/30 Days Discharge Instructions: Apply over primary dressing as directed. Secured With:  [employer] Roll Sterile, 4.5x3.1 (in/yd) Every Other Day/30 Days Discharge Instructions: Secure with Kerlix as directed. Secured With: Transpore Surgical T ape, 2x10 (in/yd) Every Other Day/30 Days Discharge Instructions: Secure dressing with tape as directed. WOUND #56: - Foot Wound Laterality: Dorsal, Left Cleanser: Soap and Water Every Other Day/30 Days Discharge Instructions: May shower and wash wound with dial antibacterial soap and water prior to dressing change. Topical: Gentamicin Every Other Day/30 Days Discharge Instructions: Apply directly to wound bed, under alginate Prim Dressing: Hydrofera Blue Classic Foam, 4x4 in Every Other Day/30 Days ary Discharge Instructions: Moisten with saline prior to applying to wound bed Secondary Dressing: Woven Gauze Sponge, Non-Sterile 4x4 in Every Other Day/30 Days Discharge Instructions: Apply over primary dressing as directed. Secured With: [employer], 4.5x3.1 (in/yd) Every Other Day/30 Days Discharge Instructions: Secure with Kerlix as directed. Secured With: Transpore Surgical T ape, 2x10 (in/yd) Every Other Day/30 Days Discharge Instructions: Secure dressing with tape as directed. WOUND #57: - Foot Wound Laterality: Plantar, Right Cleanser: Soap and Water Every Other Day/30 Days Discharge Instructions: May shower and wash wound with dial antibacterial soap  and water prior to dressing change. Prim Dressing: Hydrofera Blue Ready Foam, 2.5 x2.5 in Every Other Day/30 Days ary Discharge Instructions: Apply to wound bed as instructed Secondary Dressing: Woven Gauze Sponge, Non-Sterile 4x4 in Every Other Day/30 Days Discharge Instructions: Apply over primary dressing as directed. Secured With: [employer], 4.5x3.1 (in/yd) Every Other Day/30 Days Discharge Instructions: Secure with Kerlix as directed. Secured With: 26M Medipore H Soft Cloth Surgical T 4 x 2 (in/yd) Every Other Day/30 Days ape Discharge Instructions: Secure dressing with tape as directed. 1. Using Hydrofera Blue to the areas on his feet and the left mid tibia. 2. I have asked him to just use an ABD pad on the left mid leg it seems like his skin is reacting to something perhaps bordered foam 3. On his buttock I am just having him put ABD pads on him here. I would like to get something on the skin around this which I think is the primary problem this is not responded to pure topical steroids, topical steroids in combination with antifungals, Coloplast. This is not present on the right buttock. I think this is likely a combination of pressure shear or moisture but I have not been able to get this to receding and hence the skin breaks down all the easier. Electronic Signature(s) Signed: 09/08/2021 4:56:56 PM By: Linton Ham MD Entered By: Linton Ham on 09/08/2021 09:32:51 -------------------------------------------------------------------------------- SuperBill Details Patient Name: Date of Service: [place], [name] 09/08/2021 Medical Record Number: [id number] Patient Account Number: [id number] Date of Birth/Sex: Treating RN: 07-10-1986 (35 y.o. M) Primary Care Provider: [place], [place] Other Clinician: Referring Provider: Treating Provider/Extender: [name]: 296 Diagnosis Coding ICD-10 Codes Code Description I87.332 Chronic  venous hypertension (idiopathic) with ulcer and inflammation of left lower extremity L97.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, stage 3 L97.821 Non-pressure chronic ulcer of other part of left lower leg limited to breakdown of skin L97.521 Non-pressure chronic ulcer of other part of left foot limited to breakdown of skin G82.21 Paraplegia, complete Facility Procedures CPT4 Code: 41660630 Description: 16010 - WOUND CARE VISIT-LEV 5 EST PT Modifier: Quantity: 1 Physician Procedures : CPT4 Code Description Modifier [id number] - WC PHYS LEVEL 3 - EST PT ICD-10 Diagnosis Description L97.521 Non-pressure chronic  ulcer of other part of left foot limited to breakdown of skin L97.511 Non-pressure chronic ulcer of other part of right  foot limited to breakdown of skin L97.821 Non-pressure chronic ulcer of other part of left lower leg limited to breakdown of skin Quantity: 1 Electronic Signature(s) Signed: 09/08/2021 4:56:56 PM By: Linton Ham MD Signed: 09/08/2021 5:13:39 PM By: Deon Pilling RN, BSN Entered By: Deon Pilling on 09/08/2021 09:39:03

## 2021-09-08 NOTE — Progress Notes (Signed)
Dimino, EVIAN SALGUERO ([id number]) Visit Report for 09/08/2021 Arrival Information Details Patient Name: Date of Service: Mineer, [name] 09/08/2021 8:00 A M Medical Record Number: [id number] Patient Account Number: [id number] Date of Birth/Sex: Treating RN: 07-01-1986 (35 y.o. [name] Primary Care Mitchelle Sultan: [name], [name] Other Clinician: Referring Kent Riendeau: Treating Shylie Polo/Extender: [name]: 63 Visit Information History Since Last Visit Added or deleted any medications: No Patient Arrived: Wheel Chair Any new allergies or adverse reactions: No Arrival Time: 08:07 Had a fall or experienced change in No Accompanied By: self activities of daily living that may affect Transfer Assistance: None risk of falls: Patient Identification Verified: Yes Signs or symptoms of abuse/neglect since last visito No Secondary Verification Process Completed: Yes Hospitalized since last visit: No Patient Requires Transmission-Based Precautions: No Implantable device outside of the clinic excluding No Patient Has Alerts: Yes cellular tissue based products placed in the center Patient Alerts: R ABI = 1.0 since last visit: L ABI = 1.1 Has Dressing in Place as Prescribed: Yes Has Compression in Place as Prescribed: Yes Pain Present Now: No Electronic Signature(s) Signed: 09/08/2021 5:13:39 PM By: Deon Pilling RN, BSN Entered By: Deon Pilling on 09/08/2021 08:08:05 -------------------------------------------------------------------------------- Clinic Level of Care Assessment Details Patient Name: Date of Service: [place]reras, [name] 09/08/2021 8:00 A M Medical Record Number: [id number] Patient Account Number: [id number] Date of Birth/Sex: Treating RN: 1986-11-04 (35 y.o. [name] Primary Care Jara Feider: [name], [name] Other Clinician: Referring Naijah Lacek: Treating Minha Fulco/Extender: [name]: [facility] Level  of Care Assessment Items TOOL 4 Quantity Score X- 1 0 Use when only an EandM is performed on FOLLOW-UP visit ASSESSMENTS - Nursing Assessment / Reassessment X- 1 10 Reassessment of Co-morbidities (includes updates in patient status) X- 1 5 Reassessment of Adherence to Treatment Plan ASSESSMENTS - Wound and Skin A ssessment / Reassessment []  - 0 Simple Wound Assessment / Reassessment - one wound X- 7 5 Complex Wound Assessment / Reassessment - multiple wounds X- 1 10 Dermatologic / Skin Assessment (not related to wound area) ASSESSMENTS - Focused Assessment X- 7 5 Circumferential Edema Measurements - multi extremities X- 1 10 Nutritional Assessment / Counseling / Intervention []  - 0 Lower Extremity Assessment (monofilament, tuning fork, pulses) []  - 0 Peripheral Arterial Disease Assessment (using hand held doppler) ASSESSMENTS - Ostomy and/or Continence Assessment and Care []  - 0 Incontinence Assessment and Management []  - 0 Ostomy Care Assessment and Management (repouching, etc.) PROCESS - Coordination of Care []  - 0 Simple Patient / Family Education for ongoing care X- 1 20 Complex (extensive) Patient / Family Education for ongoing care X- 1 10 Staff obtains Programmer, systems, Records, T Results / Process Orders est []  - 0 Staff telephones HHA, Nursing Homes / Clarify orders / etc []  - 0 Routine Transfer to another Facility (non-emergent condition) []  - 0 Routine Hospital Admission (non-emergent condition) []  - 0 New Admissions / Biomedical engineer / Ordering NPWT Apligraf, etc. , []  - 0 Emergency Hospital Admission (emergent condition) []  - 0 Simple Discharge Coordination X- 1 15 Complex (extensive) Discharge Coordination PROCESS - Special Needs []  - 0 Pediatric / Minor Patient Management []  - 0 Isolation Patient Management []  - 0 Hearing / Language / Visual special needs []  - 0 Assessment of Community assistance (transportation, D/C planning, etc.) []  -  0 Additional assistance / Altered mentation []  - 0 Support Surface(s) Assessment (bed, cushion, seat, etc.) INTERVENTIONS - Wound Cleansing / Measurement []  -  0 Simple Wound Cleansing - one wound X- 7 5 Complex Wound Cleansing - multiple wounds X- 1 5 Wound Imaging (photographs - any number of wounds) []  - 0 Wound Tracing (instead of photographs) []  - 0 Simple Wound Measurement - one wound X- 7 5 Complex Wound Measurement - multiple wounds INTERVENTIONS - Wound Dressings []  - 0 Small Wound Dressing one or multiple wounds X- 3 15 Medium Wound Dressing one or multiple wounds []  - 0 Large Wound Dressing one or multiple wounds []  - 0 Application of Medications - topical []  - 0 Application of Medications - injection INTERVENTIONS - Miscellaneous []  - 0 External ear exam []  - 0 Specimen Collection (cultures, biopsies, blood, body fluids, etc.) []  - 0 Specimen(s) / Culture(s) sent or taken to Lab for analysis []  - 0 Patient Transfer (multiple staff / Civil Service fast streamer / Similar devices) []  - 0 Simple Staple / Suture removal (25 or less) []  - 0 Complex Staple / Suture removal (26 or more) []  - 0 Hypo / Hyperglycemic Management (close monitor of Blood Glucose) []  - 0 Ankle / Brachial Index (ABI) - do not check if billed separately X- 1 5 Vital Signs Has the patient been seen at the hospital within the last three years: Yes Total Score: 275 Level Of Care: New/Established - Level 5 Electronic Signature(s) Signed: 09/08/2021 5:13:39 PM By: Deon Pilling RN, BSN Entered By: Deon Pilling on 09/08/2021 09:38:15 -------------------------------------------------------------------------------- Encounter Discharge Information Details Patient Name: Date of Service: [place], [name] 09/08/2021 8:00 A M Medical Record Number: [id number] Patient Account Number: [id number] Date of Birth/Sex: Treating RN: 07-29-1986 (35 y.o. [name] Primary Care Mandolin Falwell: [place], [place] Other  Clinician: Referring Tyreese Thain: Treating Latika Kronick/Extender: [name]: 296 Encounter Discharge Information Items Discharge Condition: Stable Ambulatory Status: Wheelchair Discharge Destination: Home Transportation: Private Auto Accompanied By: self Schedule Follow-up Appointment: Yes Clinical Summary of Care: Electronic Signature(s) Signed: 09/08/2021 5:13:39 PM By: Deon Pilling RN, BSN Entered By: Deon Pilling on 09/08/2021 09:41:21 -------------------------------------------------------------------------------- Lower Extremity Assessment Details Patient Name: Date of Service: [place], [name] 09/08/2021 8:00 A M Medical Record Number: [id number] Patient Account Number: [id number] Date of Birth/Sex: Treating RN: [birth year] (35 y.o. [name] Primary Care Teshia Mahone: [place], [place] Other Clinician: Referring Kyri Shader: Treating Elery Cadenhead/Extender: [name]: 296 Edema Assessment Assessed: [Left: Yes] [Right: Yes] Edema: [Left: Yes] [Right: Yes] Calf Left: Right: Point of Measurement: 33 cm From Medial Instep 28.4 cm 32 cm Ankle Left: Right: Point of Measurement: 10 cm From Medial Instep 25 cm 23 cm Vascular Assessment Pulses: Dorsalis Pedis Palpable: [Left:Yes] [Right:Yes] Posterior Tibial Palpable: [Left:Yes] [Right:Yes] Electronic Signature(s) Signed: 09/08/2021 5:13:39 PM By: Deon Pilling RN, BSN Entered By: Deon Pilling on 09/08/2021 08:30:11 -------------------------------------------------------------------------------- Multi Wound Chart Details Patient Name: Date of Service: Vavra, [name] 09/08/2021 8:00 A M Medical Record Number: [id number] Patient Account Number: [id number] Date of Birth/Sex: Treating RN: 06-Nov-1986 (35 y.o. M) Primary Care Amand Lemoine: [name], [name] Other Clinician: Referring Keola Heninger: Treating Roschelle Calandra/Extender: [name] in  Treatment: 296 Vital Signs Height(in): 70 Pulse(bpm): 106 Weight(lbs): 216 Blood Pressure(mmHg): 126/83 Body Mass Index(BMI): 31 Temperature(F): 98 Respiratory Rate(breaths/min): 16 Photos: Left Ischium Left, Anterior Lower Leg Right, Dorsal Foot Wound Location: Gradually Appeared Trauma Gradually Appeared Wounding Event: Pressure Ulcer Trauma, Other Trauma, Other Primary Etiology: Sleep Apnea, Hypertension, Paraplegia Sleep Apnea, Hypertension, Paraplegia Sleep Apnea, Hypertension, Paraplegia Comorbid History: 03/16/2020 01/26/2021 03/27/2021 Date Acquired: 77 29 23  Weeks of Treatment: Open Open Open Wound Status: Yes No No Wound Recurrence: Yes No No Clustered Wound: 2 N/A N/A Clustered Quantity: 5x4.5x0.1 0.1x0.1x0.1 1.8x4.4x0.1 Measurements L x W x D (cm) 17.671 0.008 6.22 A (cm) : rea 1.767 0.001 0.622 Volume (cm) : -878.50% 97.60% -230.00% % Reduction in A rea: -876.20% 97.00% -230.90% % Reduction in Volume: Category/Stage II Full Thickness Without Exposed Full Thickness Without Exposed Classification: Support Structures Support Structures Medium Medium Medium Exudate Amount: Sanguinous Serosanguineous Purulent Exudate Type: red red, brown yellow, brown, green Exudate Color: Distinct, outline attached Distinct, outline attached Distinct, outline attached Wound Margin: Large (67-100%) Large (67-100%) Medium (34-66%) Granulation Amount: Red, Friable Red, Hyper-granulation, Friable Pink Granulation Quality: None Present (0%) None Present (0%) Medium (34-66%) Necrotic Amount: Fat Layer (Subcutaneous Tissue): Yes Fat Layer (Subcutaneous Tissue): Yes Fat Layer (Subcutaneous Tissue): Yes Exposed Structures: Fascia: No Fascia: No Fascia: No Tendon: No Tendon: No Tendon: No Muscle: No Muscle: No Muscle: No Joint: No Joint: No Joint: No Bone: No Bone: No Bone: No None Small (1-33%) None Epithelialization: Wound Number: [id number] Photos: Left,  Distal Ischium Left T Great oe Left, Dorsal Foot Wound Location: Pressure Injury Blister Gradually Appeared Wounding Event: Pressure Ulcer 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 Primary Etiology: Sleep Apnea, Hypertension, Paraplegia Sleep Apnea, Hypertension, Paraplegia Sleep Apnea, Hypertension, Paraplegia Comorbid History: 05/11/2021 05/26/2021 07/11/2021 Date Acquired: 17 15 7  Weeks of Treatment: Open Open Open Wound Status: No No No Wound Recurrence: No No No Clustered Wound: N/A N/A N/A Clustered Quantity: 1x1.5x0.1 0.2x0.8x0.3 3x2.5x0.1 Measurements L x W x D (cm) 1.178 0.126 5.89 A (cm) : rea 0.118 0.038 0.589 Volume (cm) : 16.70% 83.90% 5.30% % Reduction in A rea: 16.30% 51.90% 5.30% % Reduction in Volume: Category/Stage III Full Thickness With Exposed Support Full Thickness Without Exposed Classification: Structures Support Structures Medium Medium Medium Exudate Amount: Sanguinous Purulent Purulent Exudate Type: red yellow, brown, green yellow, brown, green Exudate Color: Distinct, outline attached Distinct, outline attached Distinct, outline attached Wound Margin: Large (67-100%) Medium (34-66%) Small (1-33%) Granulation Amount: Red, Friable Pink Pink Granulation Quality: None Present (0%) Medium (34-66%) Large (67-100%) Necrotic Amount: Fat Layer (Subcutaneous Tissue): Yes Fat Layer (Subcutaneous Tissue): Yes Fat Layer (Subcutaneous Tissue): Yes Exposed Structures: Fascia: No Fascia: No Fascia: No Tendon: No Tendon: No Tendon: No Muscle: No Muscle: No Muscle: No Joint: No Joint: No Joint: No Bone: No Bone: No Bone: No Medium (34-66%) None None Epithelialization: Wound Number: 57 N/A N/A Photos: N/A N/A Right, Plantar Foot N/A N/A Wound Location: Gradually Appeared N/A N/A Wounding Event: Neuropathic Ulcer-Non Diabetic N/A N/A Primary Etiology: Sleep Apnea, Hypertension, Paraplegia N/A N/A Comorbid  History: 08/25/2021 N/A N/A Date Acquired: 2 N/A N/A Weeks of Treatment: Open N/A N/A Wound Status: No N/A N/A Wound Recurrence: No N/A N/A Clustered Wound: N/A N/A N/A Clustered Quantity: 0.8x0.3x0.1 N/A N/A Measurements L x W x D (cm) 0.188 N/A N/A A (cm) : rea 0.019 N/A N/A Volume (cm) : 81.60% N/A N/A % Reduction in A rea: 81.40% N/A N/A % Reduction in Volume: Full Thickness Without Exposed N/A N/A Classification: Support Structures Medium N/A N/A Exudate Amount: Purulent N/A N/A Exudate Type: yellow, brown, green N/A N/A Exudate Color: Distinct, outline attached N/A N/A Wound Margin: Medium (34-66%) N/A N/A Granulation Amount: Red N/A N/A Granulation Quality: Medium (34-66%) N/A N/A Necrotic Amount: Fat Layer (Subcutaneous Tissue): Yes N/A N/A Exposed Structures: Fascia: No Tendon: No Muscle: No Joint: No Bone: No None N/A N/A  Epithelialization: Treatment Notes Electronic Signature(s) Signed: 09/08/2021 4:56:56 PM By: Linton Ham MD Entered By: Linton Ham on 09/08/2021 09:24:25 -------------------------------------------------------------------------------- Multi-Disciplinary Care Plan Details Patient Name: Date of Service: [place], [name] 09/08/2021 8:00 A M Medical Record Number: [id number] Patient Account Number: [id number] Date of Birth/Sex: Treating RN: 1986/06/07 (35 y.o. [name] Primary Care Serenna Deroy: [name], [name] Other Clinician: Referring Brooklee Michelin: Treating Stanislawa Gaffin/Extender: [name]: [place] reviewed with physician Active Inactive Wound/Skin Impairment Nursing Diagnoses: Impaired tissue integrity Knowledge deficit related to ulceration/compromised skin integrity Goals: Patient/caregiver will verbalize understanding of skin care regimen Date Initiated: 01/05/2016 Target Resolution Date: 10/23/2021 Goal Status: Active Ulcer/skin breakdown will  have a volume reduction of 30% by week 4 Date Initiated: 01/05/2016 Date Inactivated: 12/22/2017 Target Resolution Date: 01/19/2018 Unmet Reason: complex wounds, Goal Status: Unmet infection Interventions: Assess patient/caregiver ability to obtain necessary supplies Assess ulceration(s) every visit Provide education on ulcer and skin care Notes: 02/02/21: Complex Care, ongoing. Electronic Signature(s) Signed: 09/08/2021 5:13:39 PM By: Deon Pilling RN, BSN Entered By: Deon Pilling on 09/08/2021 08:43:00 -------------------------------------------------------------------------------- Pain Assessment Details Patient Name: Date of Service: Ketcham, [name] 09/08/2021 8:00 A M Medical Record Number: [id number] Patient Account Number: [id number] Date of Birth/Sex: Treating RN: 1986/01/14 (35 y.o. [name] Primary Care Atlee Kluth: [place], [place] Other Clinician: Referring Gilad Dugger: Treating Trenice Mesa/Extender: [name]: 296 Active Problems Location of Pain Severity and Description of Pain Patient Has Paino No Site Locations Rate the pain. Current Pain Level: 0 Pain Management and Medication Current Pain Management: Medication: No Cold Application: No Rest: No Massage: No Activity: No T.E.N.S.: No Heat Application: No Leg drop or elevation: No Is the Current Pain Management Adequate: Adequate How does your wound impact your activities of daily livingo Sleep: No Bathing: No Appetite: No Relationship With Others: No Bladder Continence: No Emotions: No Bowel Continence: No Work: No Toileting: No Drive: No Dressing: No Hobbies: No Engineer, maintenance) Signed: 09/08/2021 5:13:39 PM By: Deon Pilling RN, BSN Entered By: Deon Pilling on 09/08/2021 08:10:01 -------------------------------------------------------------------------------- Patient/Caregiver Education Details Patient Name: Date of Service: [place], A Viviann Spare  10/18/2022andnbsp8:00 A M Medical Record Number: [id number] Patient Account Number: [id number] Date of Birth/Gender: Treating RN: 03/13/1986 (35 y.o. [name] Primary Care Physician: Janine Limbo Other Clinician: Referring Physician: Treating Physician/Extender: [name]: 296 Education Assessment Education Provided To: Patient Education Topics Provided Wound/Skin Impairment: Handouts: Skin Care Do's and Dont's Methods: Explain/Verbal Responses: Reinforcements needed Electronic Signature(s) Signed: 09/08/2021 5:13:39 PM By: Deon Pilling RN, BSN Entered By: Deon Pilling on 09/08/2021 08:46:23 -------------------------------------------------------------------------------- Wound Assessment Details Patient Name: Date of Service: Alessio, [name] 09/08/2021 8:00 A M Medical Record Number: [id number] Patient Account Number: [id number] Date of Birth/Sex: Treating RN: [birth year] (35 y.o. [name] Primary Care Elliyah Liszewski: [place], [place] Other Clinician: Referring Malcolm Hetz: Treating Solina Heron/Extender: [name]: 296 Wound Status Wound Number: [id number] Primary Etiology: Pressure Ulcer Wound Location: Left Ischium Wound Status: Open Wounding Event: Gradually Appeared Comorbid History: Sleep Apnea, Hypertension, Paraplegia Date Acquired: 03/16/2020 Weeks Of Treatment: 77 Clustered Wound: Yes Photos Wound Measurements Length: (cm) 5 Width: (cm) 4.5 Depth: (cm) 0.1 Clustered Quantity: 2 Area: (cm) 17.671 Volume: (cm) 1.767 % Reduction in Area: -878.5% % Reduction in Volume: -876.2% Epithelialization: None Tunneling: No Undermining: No Wound Description Classification: Category/Stage II Wound Margin: Distinct, outline attached Exudate Amount: Medium Exudate Type: Sanguinous Exudate Color:  red Foul Odor After Cleansing: No Slough/Fibrino No Wound Bed Granulation Amount: Large  (67-100%) Exposed Structure Granulation Quality: Red, Friable Fascia Exposed: No Necrotic Amount: None Present (0%) Fat Layer (Subcutaneous Tissue) Exposed: Yes Tendon Exposed: No Muscle Exposed: No Joint Exposed: No Bone Exposed: No Treatment Notes Wound #[id number] (Ischium) Wound Laterality: Left Cleanser Soap and Water Discharge Instruction: May shower and wash wound with dial antibacterial soap and water prior to dressing change. Peri-Wound Care Topical Triamcinolone Discharge Instruction: Pick up cream from pharmacy. Apply Triamcinolone as directed Primary Dressing Secondary Dressing ABD Pad, 5x9 Discharge Instruction: Apply over primary dressing as directed. Secured With 26M Medipore H Soft Cloth Surgical T 4 x 2 (in/yd) ape Discharge Instruction: Secure dressing with tape as directed. Compression Wrap Compression Stockings Add-Ons Electronic Signature(s) Signed: 09/08/2021 5:13:39 PM By: Deon Pilling RN, BSN Entered By: Deon Pilling on 09/08/2021 08:25:28 -------------------------------------------------------------------------------- Wound Assessment Details Patient Name: Date of Service: [place]ito, [name] 09/08/2021 8:00 A M Medical Record Number: [id number] Patient Account Number: [id number] Date of Birth/Sex: Treating RN: [birth year] (35 y.o. [name] Primary Care Morelia Cassells: [name], [name] Other Clinician: Referring Niajah Sipos: Treating Bralyn Folkert/Extender: [name]: 296 Wound Status Wound Number: 51 Primary Etiology: Trauma, Other Wound Location: Left, Anterior Lower Leg Wound Status: Open Wounding Event: Trauma Comorbid History: Sleep Apnea, Hypertension, Paraplegia Date Acquired: 01/26/2021 Weeks Of Treatment: 29 Clustered Wound: No Photos Wound Measurements Length: (cm) 0.1 Width: (cm) 0.1 Depth: (cm) 0.1 Area: (cm) 0.008 Volume: (cm) 0.001 % Reduction in Area: 97.6% % Reduction in Volume:  97% Epithelialization: Small (1-33%) Tunneling: No Undermining: No Wound Description Classification: Full Thickness Without Exposed Support Structures Wound Margin: Distinct, outline attached Exudate Amount: Medium Exudate Type: Serosanguineous Exudate Color: red, brown Foul Odor After Cleansing: No Slough/Fibrino No Wound Bed Granulation Amount: Large (67-100%) Exposed Structure Granulation Quality: Red, Hyper-granulation, Friable Fascia Exposed: No Necrotic Amount: None Present (0%) Fat Layer (Subcutaneous Tissue) Exposed: Yes Tendon Exposed: No Muscle Exposed: No Joint Exposed: No Bone Exposed: No Treatment Notes Wound #51 (Lower Leg) Wound Laterality: Left, Anterior Cleanser Soap and Water Discharge Instruction: May shower and wash wound with dial antibacterial soap and water prior to dressing change. Peri-Wound Care Topical Primary Dressing Hydrofera Blue Ready Foam, 4x5 in Discharge Instruction: Apply to wound bed as instructed Secondary Dressing Woven Gauze Sponge, Non-Sterile 4x4 in Discharge Instruction: Apply over primary dressing as directed. ABD Pad, 5x9 Discharge Instruction: Apply over primary dressing as directed. Secured With [employer], 4.5x3.1 (in/yd) Discharge Instruction: Secure with Kerlix as directed. Compression Wrap Compression Stockings Add-Ons Electronic Signature(s) Signed: 09/08/2021 5:13:39 PM By: Deon Pilling RN, BSN Entered By: Deon Pilling on 09/08/2021 08:27:13 -------------------------------------------------------------------------------- Wound Assessment Details Patient Name: Date of Service: [place]iemann, [name] 09/08/2021 8:00 A M Medical Record Number: [id number] Patient Account Number: [id number] Date of Birth/Sex: Treating RN: [birth year] (35 y.o. [name] Primary Care Dannika Hilgeman: [name], [name] Other Clinician: Referring Syniah Berne: Treating Ashan Cueva/Extender: [name]:  296 Wound Status Wound Number: 52 Primary Etiology: Trauma, Other Wound Location: Right, Dorsal Foot Wound Status: Open Wounding Event: Gradually Appeared Comorbid History: Sleep Apnea, Hypertension, Paraplegia Date Acquired: 03/27/2021 Weeks Of Treatment: 23 Clustered Wound: No Photos Wound Measurements Length: (cm) 1.8 Width: (cm) 4.4 Depth: (cm) 0.1 Area: (cm) 6.22 Volume: (cm) 0.622 % Reduction in Area: -230% % Reduction in Volume: -230.9% Epithelialization: None Tunneling: No Undermining: No Wound Description Classification: Full Thickness Without Exposed Support Structures Wound  Margin: Distinct, outline attached Exudate Amount: Medium Exudate Type: Purulent Exudate Color: yellow, brown, green Foul Odor After Cleansing: No Slough/Fibrino Yes Wound Bed Granulation Amount: Medium (34-66%) Exposed Structure Granulation Quality: Pink Fascia Exposed: No Necrotic Amount: Medium (34-66%) Fat Layer (Subcutaneous Tissue) Exposed: Yes Necrotic Quality: Adherent Slough Tendon Exposed: No Muscle Exposed: No Joint Exposed: No Bone Exposed: No Treatment Notes Wound #52 (Foot) Wound Laterality: Dorsal, Right Cleanser Soap and Water Discharge Instruction: May shower and wash wound with dial antibacterial soap and water prior to dressing change. Peri-Wound Care Topical Gentamicin Discharge Instruction: As directed by physician Primary Dressing Hydrofera Blue Ready Foam, 2.5 x2.5 in Discharge Instruction: Apply to wound bed as instructed Secondary Dressing Woven Gauze Sponge, Non-Sterile 4x4 in Discharge Instruction: Apply over primary dressing as directed. Secured With [employer], 4.5x3.1 (in/yd) Discharge Instruction: Secure with Kerlix as directed. Transpore Surgical Tape, 2x10 (in/yd) Discharge Instruction: Secure dressing with tape as directed. Compression Wrap Compression Stockings Add-Ons Electronic Signature(s) Signed: 09/08/2021 5:13:39 PM By:  Deon Pilling RN, BSN Entered By: Deon Pilling on 09/08/2021 08:29:57 -------------------------------------------------------------------------------- Wound Assessment Details Patient Name: Date of Service: Prehn, [name] 09/08/2021 8:00 A M Medical Record Number: [id number] Patient Account Number: [id number] Date of Birth/Sex: Treating RN: 1986/07/27 (35 y.o. [name] Primary Care Kyan Giannone: [name], [name] Other Clinician: Referring Sael Furches: Treating Shaheer Bonfield/Extender: [name]: 296 Wound Status Wound Number: 54 Primary Etiology: Pressure Ulcer Wound Location: Left, Distal Ischium Wound Status: Open Wounding Event: Pressure Injury Comorbid History: Sleep Apnea, Hypertension, Paraplegia Date Acquired: 05/11/2021 Weeks Of Treatment: 17 Clustered Wound: No Photos Wound Measurements Length: (cm) 1 Width: (cm) 1.5 Depth: (cm) 0.1 Area: (cm) 1.178 Volume: (cm) 0.118 % Reduction in Area: 16.7% % Reduction in Volume: 16.3% Epithelialization: Medium (34-66%) Tunneling: No Undermining: No Wound Description Classification: Category/Stage III Wound Margin: Distinct, outline attached Exudate Amount: Medium Exudate Type: Sanguinous Exudate Color: red Foul Odor After Cleansing: No Slough/Fibrino No Wound Bed Granulation Amount: Large (67-100%) Exposed Structure Granulation Quality: Red, Friable Fascia Exposed: No Necrotic Amount: None Present (0%) Fat Layer (Subcutaneous Tissue) Exposed: Yes Tendon Exposed: No Muscle Exposed: No Joint Exposed: No Bone Exposed: No Treatment Notes Wound #54 (Ischium) Wound Laterality: Left, Distal Cleanser Soap and Water Discharge Instruction: May shower and wash wound with dial antibacterial soap and water prior to dressing change. Peri-Wound Care Topical Triamcinolone Discharge Instruction: Pick up cream from pharmacy. Apply Triamcinolone as directed Primary Dressing Secondary  Dressing ABD Pad, 5x9 Discharge Instruction: Apply over primary dressing as directed. Secured With 62M Medipore H Soft Cloth Surgical T 4 x 2 (in/yd) ape Discharge Instruction: Secure dressing with tape as directed. Compression Wrap Compression Stockings Add-Ons Electronic Signature(s) Signed: 09/08/2021 5:13:39 PM By: Deon Pilling RN, BSN Entered By: Deon Pilling on 09/08/2021 08:26:16 -------------------------------------------------------------------------------- Wound Assessment Details Patient Name: Date of Service: Degroat, [name] 09/08/2021 8:00 A M Medical Record Number: [id number] Patient Account Number: [id number] Date of Birth/Sex: Treating RN: [birth year] (35 y.o. [name] Primary Care Myran Arcia: [place], [place] Other Clinician: Referring Pamla Pangle: Treating Aralyn Nowak/Extender: Dutch Gray, [name]: 296 Wound Status Wound Number: 55 Primary Etiology: Neuropathic Ulcer-Non Diabetic Wound Location: Left T Great oe Wound Status: Open Wounding Event: Blister Comorbid History: Sleep Apnea, Hypertension, Paraplegia Date Acquired: 05/26/2021 Weeks Of Treatment: 15 Clustered Wound: No Photos Wound Measurements Length: (cm) 0.2 Width: (cm) 0.8 Depth: (cm) 0.3 Area: (cm) 0.126 Volume: (cm) 0.038 % Reduction in Area: 83.9% %  Reduction in Volume: 51.9% Epithelialization: None Tunneling: No Undermining: No Wound Description Classification: Full Thickness With Exposed Support Structures Wound Margin: Distinct, outline attached Exudate Amount: Medium Exudate Type: Purulent Exudate Color: yellow, brown, green Foul Odor After Cleansing: No Slough/Fibrino Yes Wound Bed Granulation Amount: Medium (34-66%) Exposed Structure Granulation Quality: Pink Fascia Exposed: No Necrotic Amount: Medium (34-66%) Fat Layer (Subcutaneous Tissue) Exposed: Yes Necrotic Quality: Adherent Slough Tendon Exposed: No Muscle Exposed: No Joint Exposed:  No Bone Exposed: No Treatment Notes Wound #55 (Toe Great) Wound Laterality: Left Cleanser Soap and Water Discharge Instruction: May shower and wash wound with dial antibacterial soap and water prior to dressing change. Peri-Wound Care Topical Primary Dressing Hydrofera Blue Ready Foam, 2.5 x2.5 in Discharge Instruction: Apply to wound bed as instructed Secondary Dressing Woven Gauze Sponge, Non-Sterile 4x4 in Discharge Instruction: Apply over primary dressing as directed. Secured With [employer], 4.5x3.1 (in/yd) Discharge Instruction: Secure with Kerlix as directed. Transpore Surgical Tape, 2x10 (in/yd) Discharge Instruction: Secure dressing with tape as directed. Compression Wrap Compression Stockings Add-Ons Electronic Signature(s) Signed: 09/08/2021 5:13:39 PM By: Deon Pilling RN, BSN Entered By: Deon Pilling on 09/08/2021 08:28:05 -------------------------------------------------------------------------------- Wound Assessment Details Patient Name: Date of Service: [place]be, [name] 09/08/2021 8:00 A M Medical Record Number: [id number] Patient Account Number: [id number] Date of Birth/Sex: Treating RN: 1986-09-29 (34 y.o. [name] Primary Care Mikahla Wisor: [name], [name] Other Clinician: Referring Legrand Lasser: Treating Emmi Wertheim/Extender: [name]: 296 Wound Status Wound Number: 56 Primary Etiology: Neuropathic Ulcer-Non Diabetic Wound Location: Left, Dorsal Foot Wound Status: Open Wounding Event: Gradually Appeared Comorbid History: Sleep Apnea, Hypertension, Paraplegia Date Acquired: 07/11/2021 Weeks Of Treatment: 7 Clustered Wound: No Photos Wound Measurements Length: (cm) 3 Width: (cm) 2.5 Depth: (cm) 0.1 Area: (cm) 5.89 Volume: (cm) 0.589 % Reduction in Area: 5.3% % Reduction in Volume: 5.3% Epithelialization: None Tunneling: No Undermining: No Wound Description Classification: Full Thickness  Without Exposed Support Structu Wound Margin: Distinct, outline attached Exudate Amount: Medium Exudate Type: Purulent Exudate Color: yellow, brown, green res Foul Odor After Cleansing: No Slough/Fibrino Yes Wound Bed Granulation Amount: Small (1-33%) Exposed Structure Granulation Quality: Pink Fascia Exposed: No Necrotic Amount: Large (67-100%) Fat Layer (Subcutaneous Tissue) Exposed: Yes Necrotic Quality: Adherent Slough Tendon Exposed: No Muscle Exposed: No Joint Exposed: No Bone Exposed: No Treatment Notes Wound #56 (Foot) Wound Laterality: Dorsal, Left Cleanser Soap and Water Discharge Instruction: May shower and wash wound with dial antibacterial soap and water prior to dressing change. Peri-Wound Care Topical Gentamicin Discharge Instruction: Apply directly to wound bed, under alginate Primary Dressing Hydrofera Blue Classic Foam, 4x4 in Discharge Instruction: Moisten with saline prior to applying to wound bed Secondary Dressing Woven Gauze Sponge, Non-Sterile 4x4 in Discharge Instruction: Apply over primary dressing as directed. Secured With [employer], 4.5x3.1 (in/yd) Discharge Instruction: Secure with Kerlix as directed. Transpore Surgical Tape, 2x10 (in/yd) Discharge Instruction: Secure dressing with tape as directed. Compression Wrap Compression Stockings Add-Ons Electronic Signature(s) Signed: 09/08/2021 5:13:39 PM By: Deon Pilling RN, BSN Entered By: Deon Pilling on 09/08/2021 08:29:07 -------------------------------------------------------------------------------- Wound Assessment Details Patient Name: Date of Service: [place], [name] 09/08/2021 8:00 A M Medical Record Number: [id number] Patient Account Number: [id number] Date of Birth/Sex: Treating RN: [birth year] (35 y.o. [name] Primary Care Brightyn Mozer: [name], [name] Other Clinician: Referring Gayatri [place]as[place]: Treating Schon Zeiders/Extender: [name] in  Treatment: 296 Wound Status Wound Number: 57 Primary Etiology: Neuropathic Ulcer-Non Diabetic Wound Location: Right, Plantar Foot Wound  Status: Open Wounding Event: Gradually Appeared Comorbid History: Sleep Apnea, Hypertension, Paraplegia Date Acquired: 08/25/2021 Weeks Of Treatment: 2 Clustered Wound: No Photos Wound Measurements Length: (cm) 0.8 Width: (cm) 0.3 Depth: (cm) 0.1 Area: (cm) 0.188 Volume: (cm) 0.019 % Reduction in Area: 81.6% % Reduction in Volume: 81.4% Epithelialization: None Tunneling: No Undermining: No Wound Description Classification: Full Thickness Without Exposed Support Structures Wound Margin: Distinct, outline attached Exudate Amount: Medium Exudate Type: Purulent Exudate Color: yellow, brown, green Foul Odor After Cleansing: No Slough/Fibrino Yes Wound Bed Granulation Amount: Medium (34-66%) Exposed Structure Granulation Quality: Red Fascia Exposed: No Necrotic Amount: Medium (34-66%) Fat Layer (Subcutaneous Tissue) Exposed: Yes Necrotic Quality: Adherent Slough Tendon Exposed: No Muscle Exposed: No Joint Exposed: No Bone Exposed: No Treatment Notes Wound #57 (Foot) Wound Laterality: Plantar, Right Cleanser Soap and Water Discharge Instruction: May shower and wash wound with dial antibacterial soap and water prior to dressing change. Peri-Wound Care Topical Primary Dressing Hydrofera Blue Ready Foam, 2.5 x2.5 in Discharge Instruction: Apply to wound bed as instructed Secondary Dressing Woven Gauze Sponge, Non-Sterile 4x4 in Discharge Instruction: Apply over primary dressing as directed. Secured With [employer], 4.5x3.1 (in/yd) Discharge Instruction: Secure with Kerlix as directed. 31M Medipore H Soft Cloth Surgical T 4 x 2 (in/yd) ape Discharge Instruction: Secure dressing with tape as directed. Compression Wrap Compression Stockings Add-Ons Electronic Signature(s) Signed: 09/08/2021 5:13:39 PM By: Deon Pilling RN,  BSN Entered By: Deon Pilling on 09/08/2021 08:30:48 -------------------------------------------------------------------------------- Vitals Details Patient Name: Date of Service: Dula, [name] 09/08/2021 8:00 A M Medical Record Number: [id number] Patient Account Number: [id number] Date of Birth/Sex: Treating RN: February 04, 1986 (35 y.o. [name] Primary Care Purvi Ruehl: [place], [place] Other Clinician: Referring Linkyn Gobin: Treating Amerie Beaumont/Extender: [name]: 296 Vital Signs Time Taken: 08:08 Temperature (F): 98 Height (in): 70 Pulse (bpm): 106 Weight (lbs): 216 Respiratory Rate (breaths/min): 16 Body Mass Index (BMI): 31 Blood Pressure (mmHg): 126/83 Reference Range: 80 - 120 mg / dl Electronic Signature(s) Signed: 09/08/2021 5:13:39 PM By: Deon Pilling RN, BSN Entered By: Deon Pilling on 09/08/2021 08:08:30

## 2021-09-10 ENCOUNTER — Encounter (HOSPITAL_BASED_OUTPATIENT_CLINIC_OR_DEPARTMENT_OTHER): Payer: BC Managed Care – PPO | Admitting: Internal Medicine

## 2021-09-22 ENCOUNTER — Encounter (HOSPITAL_BASED_OUTPATIENT_CLINIC_OR_DEPARTMENT_OTHER): Payer: BC Managed Care – PPO | Admitting: Internal Medicine

## 2021-09-25 NOTE — Telephone Encounter (Signed)
Called Pt [name] on VM to call office to schedule apt with a new provider.

## 2021-09-26 DIAGNOSIS — Z20828 Contact with and (suspected) exposure to other viral communicable diseases: Secondary | ICD-10-CM | POA: Diagnosis not present

## 2021-09-26 DIAGNOSIS — R6883 Chills (without fever): Secondary | ICD-10-CM | POA: Diagnosis not present

## 2021-09-26 DIAGNOSIS — R509 Fever, unspecified: Secondary | ICD-10-CM | POA: Diagnosis not present

## 2021-09-26 DIAGNOSIS — R070 Pain in throat: Secondary | ICD-10-CM | POA: Diagnosis not present

## 2021-09-26 DIAGNOSIS — J029 Acute pharyngitis, unspecified: Secondary | ICD-10-CM | POA: Diagnosis not present

## 2021-09-29 ENCOUNTER — Encounter (HOSPITAL_BASED_OUTPATIENT_CLINIC_OR_DEPARTMENT_OTHER): Payer: BC Managed Care – PPO | Admitting: Internal Medicine

## 2021-10-06 ENCOUNTER — Ambulatory Visit (INDEPENDENT_AMBULATORY_CARE_PROVIDER_SITE_OTHER): Payer: BC Managed Care – PPO | Admitting: Behavioral Health

## 2021-10-06 ENCOUNTER — Other Ambulatory Visit: Payer: Self-pay

## 2021-10-06 ENCOUNTER — Encounter (HOSPITAL_BASED_OUTPATIENT_CLINIC_OR_DEPARTMENT_OTHER): Payer: BC Managed Care – PPO | Attending: Internal Medicine | Admitting: Internal Medicine

## 2021-10-06 ENCOUNTER — Encounter: Payer: Self-pay | Admitting: Behavioral Health

## 2021-10-06 DIAGNOSIS — L89623 Pressure ulcer of left heel, stage 3: Secondary | ICD-10-CM | POA: Diagnosis not present

## 2021-10-06 DIAGNOSIS — L89523 Pressure ulcer of left ankle, stage 3: Secondary | ICD-10-CM | POA: Diagnosis not present

## 2021-10-06 DIAGNOSIS — I87332 Chronic venous hypertension (idiopathic) with ulcer and inflammation of left lower extremity: Secondary | ICD-10-CM | POA: Diagnosis not present

## 2021-10-06 DIAGNOSIS — L97511 Non-pressure chronic ulcer of other part of right foot limited to breakdown of skin: Secondary | ICD-10-CM | POA: Diagnosis not present

## 2021-10-06 DIAGNOSIS — L89323 Pressure ulcer of left buttock, stage 3: Secondary | ICD-10-CM | POA: Insufficient documentation

## 2021-10-06 DIAGNOSIS — L97521 Non-pressure chronic ulcer of other part of left foot limited to breakdown of skin: Secondary | ICD-10-CM | POA: Diagnosis not present

## 2021-10-06 DIAGNOSIS — F341 Dysthymic disorder: Secondary | ICD-10-CM

## 2021-10-06 DIAGNOSIS — G8220 Paraplegia, unspecified: Secondary | ICD-10-CM | POA: Insufficient documentation

## 2021-10-06 DIAGNOSIS — L97821 Non-pressure chronic ulcer of other part of left lower leg limited to breakdown of skin: Secondary | ICD-10-CM | POA: Diagnosis not present

## 2021-10-06 DIAGNOSIS — G6289 Other specified polyneuropathies: Secondary | ICD-10-CM | POA: Diagnosis not present

## 2021-10-06 DIAGNOSIS — L89302 Pressure ulcer of unspecified buttock, stage 2: Secondary | ICD-10-CM | POA: Diagnosis not present

## 2021-10-06 DIAGNOSIS — L97822 Non-pressure chronic ulcer of other part of left lower leg with fat layer exposed: Secondary | ICD-10-CM | POA: Diagnosis not present

## 2021-10-06 DIAGNOSIS — I10 Essential (primary) hypertension: Secondary | ICD-10-CM | POA: Insufficient documentation

## 2021-10-06 DIAGNOSIS — L97522 Non-pressure chronic ulcer of other part of left foot with fat layer exposed: Secondary | ICD-10-CM | POA: Diagnosis not present

## 2021-10-06 MED ORDER — SERTRALINE HCL 100 MG PO TABS: ORAL_TABLET | ORAL | 4 refills | Status: DC

## 2021-10-06 NOTE — Progress Notes (Signed)
[facility]  Patient ID: [name],  MRN: [id number]  PCP: Janine Limbo, PA-C  Date of Evaluation: 10/06/2021 Time spent:40 minutes  Chief Complaint:  Chief Complaint   Anxiety; Depression; Follow-up; Medication Refill     HISTORY/CURRENT STATUS: HPI  35 year old male presents to this office for follow up and medication refill. He is former patient to Dr. Milana Huntsman retired. He says that he continues to do very well and is stable. He does not want to make any changes to his medications at this time. He reports 0/10 depression, and 0/10 anxiety. He sleeps 7 plus hours per night. Denies any significant social changes. He is wheelchair bound and has non healing wounds on both legs bilaterally. He goes to wound care once weekly. He denies mania, no psychosis. No SI/HI.   No prior psychiatric medication failures   Individual Medical History/ Review of Systems: Changes? :No   Allergies: Sulfa antibiotics, Levofloxacin, Meropenem, Penicillins, and Zyvox [linezolid]  Current Medications:  Current Outpatient Medications:    Amino Acids-Protein Hydrolys (FEEDING SUPPLEMENT, PRO-STAT SUGAR FREE 64,) LIQD, Take 30 mLs by mouth 2 (two) times daily., Disp: , Rfl:    Ascorbic Acid (VITAMIN C) 1000 MG tablet, Take 1,000 mg by mouth daily., Disp: , Rfl:    baclofen (LIORESAL) 20 MG tablet, Take 40-60 mg by mouth 3 (three) times daily. 60mg  in the morning, 40mg  in the afternoon, and 60mg  in the evening, Disp: , Rfl:    imipramine (TOFRANIL) 50 MG tablet, Take 50 mg by mouth at bedtime. , Disp: , Rfl:    lisinopril-hydrochlorothiazide (PRINZIDE,ZESTORETIC) 20-25 MG per tablet, Take 1 tablet by mouth daily., Disp: , Rfl:    Melatonin 10 MG CAPS, Take by mouth. Nightly, Disp: , Rfl:    Multiple Vitamin (MULTIVITAMIN) tablet, Take 1 tablet by mouth daily., Disp: , Rfl:    oxybutynin (DITROPAN XL) 15 MG 24 hr tablet, Take 30 mg by mouth at bedtime. , Disp: , Rfl:    zinc sulfate  220 (50 Zn) MG capsule, Take 220 mg by mouth daily., Disp: , Rfl:    sertraline (ZOLOFT) 100 MG tablet, TAKE 1 TABLET(100 MG) BY MOUTH DAILY AFTER BREAKFAST, Disp: 30 tablet, Rfl: 4 Medication Side Effects: none  Family Medical/ Social History: Changes? No  MENTAL HEALTH EXAM:  There were no vitals taken for this visit.There is no height or weight on file to calculate BMI.  General Appearance: Casual, Neat, and Well Groomed  Eye Contact:  Good  Speech:  Clear and Coherent  Volume:  Normal  Mood:  NA  Affect:  Appropriate  Thought Process:  Coherent  Orientation:  Full (Time, Place, and Person)  Thought Content: Logical   Suicidal Thoughts:  No  Homicidal Thoughts:  No  Memory:  WNL  Judgement:  Good  Insight:  Good  Psychomotor Activity:  Normal  Concentration:  Concentration: Good  Recall:  Good  Fund of Knowledge: Good  Language: Good  Assets:  Desire for Improvement  ADL's:  Intact  Cognition: WNL  Prognosis:  Good    DIAGNOSES:    ICD-10-CM   1. Persistent depressive disorder with melancholic features, currently mild  F34.1 sertraline (ZOLOFT) 100 MG tablet      Receiving Psychotherapy: No    RECOMMENDATIONS:  RECOMMENDATIONS: Greater than 50% of  40 min face to face time with patient was spent on counseling and coordination of care. We discussed his history of anxiety and depression and his previous  tx plan with Dr. Creig Hines. He continue to be stable and wants to continue with Zoloft 100 mg. He agrees to follow up in 3 months to reassess since he is new patient to me.   He is escribed Zoloft 100 mg tablet every morning [facility] as [facility] [address] for dysthymia.  He has CPAP and Tofranil for sleep apnea and neurogenic bladder from his medical services.   He does take melatonin 10 mg nightly as needed for insomnia OTC.   Will report and worsening symptoms promptly Provided emergency contact information Reviewed  PDMP       Elwanda Brooklyn, NP

## 2021-10-06 NOTE — Progress Notes (Signed)
Bromwell, JAIVEN GRAVELINE ([id number]) Visit Report for 10/06/2021 Arrival Information Details Patient Name: Date of Service: Row, [name] 10/06/2021 8:00 A M Medical Record Number: [id number] Patient Account Number: [id number] Date of Birth/Sex: Treating RN: 1986-10-11 (35 y.o. [name] Primary Care Japji Kok: [place], [place] Other Clinician: Referring Dequandre Cordova: Treating Timira Bieda/Extender: [name]: 300 Visit Information History Since Last Visit Added or deleted any medications: No Patient Arrived: Wheel Chair Any new allergies or adverse reactions: No Arrival Time: 08:01 Had a fall or experienced change in No Transfer Assistance: Manual activities of daily living that may affect Patient Identification Verified: Yes risk of falls: Secondary Verification Process Completed: Yes Signs or symptoms of abuse/neglect since last visito No Patient Requires Transmission-Based Precautions: No Hospitalized since last visit: No Patient Has Alerts: Yes Implantable device outside of the clinic excluding No Patient Alerts: R ABI = 1.0 cellular tissue based products placed in the center L ABI = 1.1 since last visit: Has Dressing in Place as Prescribed: Yes Has Compression in Place as Prescribed: Yes Pain Present Now: No Electronic Signature(s) Signed: 10/06/2021 4:38:08 PM By: Lorrin Jackson Entered By: Lorrin Jackson on 10/06/2021 08:01:46 -------------------------------------------------------------------------------- Encounter Discharge Information Details Patient Name: Date of Service: Seiden, [name] 10/06/2021 8:00 A M Medical Record Number: [id number] Patient Account Number: [id number] Date of Birth/Sex: Treating RN: 1985/12/23 (35 y.o. [name] Primary Care Cyenna Rebello: [place], [place] Other Clinician: Referring Dayten Juba: Treating Raelee Rossmann/Extender: [name]: 300 Encounter Discharge Information  Items Post Procedure Vitals Discharge Condition: Stable Temperature (F): 98.6 Ambulatory Status: Wheelchair Pulse (bpm): 111 Discharge Destination: Home Respiratory Rate (breaths/min): 16 Transportation: Private Auto Blood Pressure (mmHg): 133/79 Schedule Follow-up Appointment: Yes Clinical Summary of Care: Provided on 10/06/2021 Form Type Recipient Paper Patient Patient Electronic Signature(s) Signed: 10/06/2021 9:11:55 AM By: Lorrin Jackson Entered By: Lorrin Jackson on 10/06/2021 09:11:55 -------------------------------------------------------------------------------- Lower Extremity Assessment Details Patient Name: Date of Service: Bultman, [name] 10/06/2021 8:00 A M Medical Record Number: [id number] Patient Account Number: [id number] Date of Birth/Sex: Treating RN: 1986-11-07 (35 y.o. [name] Primary Care Rhyatt Muska: [place], [place] Other Clinician: Referring Manav Pierotti: Treating Diannia Hogenson/Extender: Dutch Gray, [name]: 300 Edema Assessment Assessed: [Left: Yes] [Right: Yes] Edema: [Left: Yes] [Right: Yes] Calf Left: Right: Point of Measurement: 33 cm From Medial Instep 29 cm 32 cm Ankle Left: Right: Point of Measurement: 10 cm From Medial Instep 25 cm 24 cm Vascular Assessment Pulses: Dorsalis Pedis Palpable: [Left:Yes] [Right:Yes] Electronic Signature(s) Signed: 10/06/2021 4:38:08 PM By: Lorrin Jackson Entered By: Lorrin Jackson on 10/06/2021 08:39:09 -------------------------------------------------------------------------------- Multi Wound Chart Details Patient Name: Date of Service: Cotroneo, [name] 10/06/2021 8:00 A M Medical Record Number: [id number] Patient Account Number: [id number] Date of Birth/Sex: Treating RN: Apr 15, 1986 (35 y.o. M) Primary Care Maralyn Witherell: [name], [name] Other Clinician: Referring Jennice Renegar: Treating Storie Heffern/Extender: Dutch Gray, [name]: 300 Vital Signs Height(in):  70 Pulse(bpm): 111 Weight(lbs): 216 Blood Pressure(mmHg): 133/79 Body Mass Index(BMI): 31 Temperature(F): 98.6 Respiratory Rate(breaths/min): 16 Photos: [41R:Left Ischium] [51:Left, Anterior Lower Leg] [52:Right, Dorsal Foot] Wound Location: [41R:Gradually Appeared] [51:Trauma] [52:Gradually Appeared] Wounding Event: [41R:Pressure Ulcer] [51:Trauma, Other] [52:Trauma, Other] Primary Etiology: [41R:Sleep Apnea, Hypertension, Paraplegia Sleep Apnea, Hypertension, Paraplegia Sleep Apnea, Hypertension, Paraplegia] Comorbid History: [41R:03/16/2020] [51:01/26/2021] [52:03/27/2021] Date Acquired: [41R:81] [51:33] [52:27] Weeks of Treatment: [41R:Open] [51:Open] [52:Open] Wound Status: [41R:Yes] [51:No] [52:No] Wound Recurrence: [41R:Yes] [51:No] [52:No] Clustered Wound: [41R:2] [51:N/A] [52:N/A] Clustered Quantity: [41R:7.5x6.3x0.1] [51:1.9x1x0.1] [52:2.5x5x0.1] Measurements  L x W x D (cm) [41R:37.11] [51:1.492] [52:9.817] A (cm) : rea [41R:3.711] [51:0.149] [52:0.982] Volume (cm) : [41R:-1954.80%] [51:-352.10%] [52:-420.80%] % Reduction in A rea: [41R:-1950.30%] [51:-351.50%] [52:-422.30%] % Reduction in Volume: [41R:Category/Stage II] [51:Full Thickness Without Exposed] [52:Full Thickness Without Exposed] Classification: [41R:Medium] [51:Support Structures Medium] [52:Support Structures Medium] Exudate A mount: [41R:Sanguinous] [51:Serosanguineous] [52:Purulent] Exudate Type: [41R:red] [51:red, brown] [52:yellow, brown, green] Exudate Color: [41R:Distinct, outline attached] [51:Distinct, outline attached] [52:Distinct, outline attached] Wound Margin: [41R:Large (67-100%)] [51:Large (67-100%)] [52:Medium (34-66%)] Granulation A mount: [41R:Red, Friable] [51:Red, Hyper-granulation, Friable] [52:Pink] Granulation Quality: [41R:None Present (0%)] [51:None Present (0%)] [52:Medium (34-66%)] Necrotic A mount: [41R:Fat Layer (Subcutaneous Tissue): Yes Fat Layer (Subcutaneous Tissue): Yes Fat  Layer (Subcutaneous Tissue): Yes] Exposed Structures: [41R:Fascia: No Tendon: No Muscle: No Joint: No Bone: No None] [51:Fascia: No Tendon: No Muscle: No Joint: No Bone: No Small (1-33%)] [52:Fascia: No Tendon: No Muscle: No Joint: No Bone: No None] Epithelialization: [41R:N/A] [51:Debridement - Excisional] [52:N/A] Debridement: Pre-procedure Verification/Time Out N/A [51:08:37] [52:N/A] Taken: [41R:N/A] [51:Subcutaneous] [52:N/A] Tissue Debrided: [41R:N/A] [51:Skin/Subcutaneous Tissue] [52:N/A] Level: [41R:N/A] [51:1.9] [52:N/A] Debridement A (sq cm): [41R:rea N/A] [51:Curette] [52:N/A] Instrument: [41R:N/A] [51:Moderate] [52:N/A] Bleeding: [41R:N/A] [51:Silver Nitrate] [52:N/A] Hemostasis A chieved: [41R:N/A] [51:Procedure was tolerated well] [52:N/A] Debridement Treatment Response: [41R:N/A] [51:1.9x1x0.1] [52:N/A] Post Debridement Measurements L x W x D (cm) [41R:N/A] [51:0.149] [52:N/A] Post Debridement Volume: (cm) [41R:N/A] [51:Debridement] [52:N/A] Wound Number: [id number] Photos: No Photos Left, Distal Ischium Left T Great oe Left, Dorsal Foot Wound Location: Pressure Injury Blister Gradually Appeared Wounding Event: Pressure Ulcer 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 Primary Etiology: Sleep Apnea, Hypertension, Paraplegia Sleep Apnea, Hypertension, Paraplegia Sleep Apnea, Hypertension, Paraplegia Comorbid History: 05/11/2021 05/26/2021 07/11/2021 Date Acquired: 21 19 11  Weeks of Treatment: Converted Open Open Wound Status: No No No Wound Recurrence: No No No Clustered Wound: N/A N/A N/A Clustered Quantity: N/A 0.4x0.2x0.2 2.4x2.6x0.1 Measurements L x W x D (cm) N/A 0.063 4.901 A (cm) : rea N/A 0.013 0.49 Volume (cm) : N/A 92.00% 21.20% % Reduction in A rea: N/A 83.50% 21.20% % Reduction in Volume: Category/Stage III Full Thickness With Exposed Support Full Thickness Without Exposed Classification: Structures Support  Structures Medium Medium Medium Exudate Amount: Sanguinous Purulent Purulent Exudate Type: red yellow, brown, green yellow, brown, green Exudate Color: Distinct, outline attached Distinct, outline attached Distinct, outline attached Wound Margin: Large (67-100%) Large (67-100%) Small (1-33%) Granulation Amount: Red, Friable Red, Pink Pink Granulation Quality: None Present (0%) None Present (0%) Large (67-100%) Necrotic Amount: Fat Layer (Subcutaneous Tissue): Yes Fat Layer (Subcutaneous Tissue): Yes Fat Layer (Subcutaneous Tissue): Yes Exposed Structures: Fascia: No Fascia: No Fascia: No Tendon: No Tendon: No Tendon: No Muscle: No Muscle: No Muscle: No Joint: No Joint: No Joint: No Bone: No Bone: No Bone: No Medium (34-66%) None Small (1-33%) Epithelialization: N/A N/A Debridement - Excisional Debridement: Pre-procedure Verification/Time Out N/A N/A 08:37 Taken: N/A N/A Subcutaneous, Slough Tissue Debrided: N/A N/A Skin/Subcutaneous Tissue Level: N/A N/A 6.24 Debridement A (sq cm): rea N/A N/A Curette Instrument: N/A N/A Minimum Bleeding: N/A N/A Pressure Hemostasis A chieved: N/A N/A Procedure was tolerated well Debridement Treatment Response: N/A N/A 2.4x2.6x0.1 Post Debridement Measurements L x W x D (cm) N/A N/A 0.49 Post Debridement Volume: (cm) N/A N/A Debridement Procedures Performed: Wound Number: [id number] N/A Photos: N/A Right, Plantar Foot Right, Lateral Lower Leg N/A Wound Location: Gradually Appeared Gradually Appeared N/A Wounding Event: Neuropathic Ulcer-Non Diabetic Trauma, Other N/A Primary Etiology: Sleep Apnea, Hypertension, Paraplegia Sleep Apnea, Hypertension, Paraplegia  N/A Comorbid History: 08/25/2021 09/23/2021 N/A Date Acquired: 6 0 N/A Weeks of Treatment: Open Open N/A Wound Status: No No N/A Wound Recurrence: No No N/A Clustered Wound: N/A N/A N/A Clustered Quantity: 1.3x0.8x0.1 1x1.4x0.1 N/A Measurements L x W x  D (cm) 0.817 1.1 N/A A (cm) : rea 0.082 0.11 N/A Volume (cm) : 20.00% 0.00% N/A % Reduction in A rea: 19.60% 0.00% N/A % Reduction in Volume: Full Thickness Without Exposed Full Thickness Without Exposed N/A Classification: Support Structures Support Structures Medium Medium N/A Exudate A mount: Purulent Serosanguineous N/A Exudate Type: yellow, brown, green red, brown N/A Exudate Color: Distinct, outline attached Distinct, outline attached N/A Wound Margin: Medium (34-66%) Large (67-100%) N/A Granulation A mount: Red Red N/A Granulation Quality: Medium (34-66%) None Present (0%) N/A Necrotic A mount: Fat Layer (Subcutaneous Tissue): Yes Fat Layer (Subcutaneous Tissue): Yes N/A Exposed Structures: Fascia: No Fascia: No Tendon: No Tendon: No Muscle: No Muscle: No Joint: No Joint: No Bone: No Bone: No Small (1-33%) None N/A Epithelialization: N/A N/A N/A Debridement: N/A N/A N/A Tissue Debrided: N/A N/A N/A Level: N/A N/A N/A Debridement A (sq cm): rea N/A N/A N/A Instrument: N/A N/A N/A Bleeding: N/A N/A N/A Hemostasis A chieved: Debridement Treatment Response: N/A N/A N/A Post Debridement Measurements L x N/A N/A N/A W x D (cm) N/A N/A N/A Post Debridement Volume: (cm) N/A N/A N/A Procedures Performed: Treatment Notes Electronic Signature(s) Signed: 10/06/2021 4:37:23 PM By: Linton Ham MD Entered By: Linton Ham on 10/06/2021 08:56:37 -------------------------------------------------------------------------------- Multi-Disciplinary Care Plan Details Patient Name: Date of Service: Bonsell, [name] 10/06/2021 8:00 A M Medical Record Number: [id number] Patient Account Number: [id number] Date of Birth/Sex: Treating RN: 1986/03/01 (35 y.o. [name] Primary Care Deyja Sochacki: [name], [name] Other Clinician: Referring Delsie Amador: Treating Safal Halderman/Extender: [name]: [place] reviewed with physician Active Inactive Wound/Skin Impairment Nursing Diagnoses: Impaired tissue integrity Knowledge deficit related to ulceration/compromised skin integrity Goals: Patient/caregiver will verbalize understanding of skin care regimen Date Initiated: 01/05/2016 Target Resolution Date: 10/23/2021 Goal Status: Active Ulcer/skin breakdown will have a volume reduction of 30% by week 4 Date Initiated: 01/05/2016 Date Inactivated: 12/22/2017 Target Resolution Date: 01/19/2018 Unmet Reason: complex wounds, Goal Status: Unmet infection Interventions: Assess patient/caregiver ability to obtain necessary supplies Assess ulceration(s) every visit Provide education on ulcer and skin care Notes: 02/02/21: Complex Care, ongoing. Electronic Signature(s) Signed: 10/06/2021 4:38:08 PM By: Lorrin Jackson Entered By: Lorrin Jackson on 10/06/2021 08:29:32 -------------------------------------------------------------------------------- Pain Assessment Details Patient Name: Date of Service: Yeldell, [name] 10/06/2021 8:00 A M Medical Record Number: [id number] Patient Account Number: [id number] Date of Birth/Sex: Treating RN: 08/11/1986 (35 y.o. [name] Primary Care Lovena Kluck: [place], [place] Other Clinician: Referring Damyn Weitzel: Treating Jessamyn Watterson/Extender: [name]: 300 Active Problems Location of Pain Severity and Description of Pain Patient Has Paino No Site Locations Pain Management and Medication Current Pain Management: Electronic Signature(s) Signed: 10/06/2021 4:38:08 PM By: Lorrin Jackson Entered By: Lorrin Jackson on 10/06/2021 08:04:02 -------------------------------------------------------------------------------- Patient/Caregiver Education Details Patient Name: Date of Service: Mccumbers, [name] 11/15/2022andnbsp8:00 A M Medical Record Number: [id number] Patient Account Number: [id number] Date of  Birth/Gender: Treating RN: August 29, 1986 (35 y.o. [name] Primary Care Physician: Janine Limbo Other Clinician: Referring Physician: Treating Physician/Extender: [name]: 300 Education Assessment Education Provided To: Patient Education Topics Provided Pressure: Methods: Explain/Verbal, Printed Responses: State content correctly Venous: Methods: Explain/Verbal Responses: State content correctly  Wound/Skin Impairment: Methods: Explain/Verbal, Printed Responses: State content correctly Electronic Signature(s) Signed: 10/06/2021 4:38:08 PM By: Lorrin Jackson Entered By: Lorrin Jackson on 10/06/2021 08:40:54 -------------------------------------------------------------------------------- Wound Assessment Details Patient Name: Date of Service: Viscomi, [name] 10/06/2021 8:00 A M Medical Record Number: [id number] Patient Account Number: [id number] Date of Birth/Sex: Treating RN: 11/15/1986 (35 y.o. [name] Primary Care Mitchell Epling: [name], [name] Other Clinician: Referring Zeek Rostron: Treating Petula Rotolo/Extender: [name]: 300 Wound Status Wound Number: [id number] Primary Etiology: Pressure Ulcer Wound Location: Left Ischium Wound Status: Open Wounding Event: Gradually Appeared Comorbid History: Sleep Apnea, Hypertension, Paraplegia Date Acquired: 03/16/2020 Weeks Of Treatment: 81 Clustered Wound: Yes Photos Wound Measurements Length: (cm) 7.5 Width: (cm) 6.3 Depth: (cm) 0.1 Clustered Quantity: 2 Area: (cm) 37.11 Volume: (cm) 3.711 % Reduction in Area: -1954.8% % Reduction in Volume: -1950.3% Epithelialization: None Tunneling: No Undermining: No Wound Description Classification: Category/Stage II Wound Margin: Distinct, outline attached Exudate Amount: Medium Exudate Type: Sanguinous Exudate Color: red Foul Odor After Cleansing: No Slough/Fibrino No Wound  Bed Granulation Amount: Large (67-100%) Exposed Structure Granulation Quality: Red, Friable Fascia Exposed: No Necrotic Amount: None Present (0%) Fat Layer (Subcutaneous Tissue) Exposed: Yes Tendon Exposed: No Muscle Exposed: No Joint Exposed: No Bone Exposed: No Treatment Notes Wound #[id number] (Ischium) Wound Laterality: Left Cleanser Soap and Water Discharge Instruction: May shower and wash wound with dial antibacterial soap and water prior to dressing change. Peri-Wound Care Triad Hydrophilic Wound Dressing Tube, 6 (oz) Discharge Instruction: Apply to periwound with each dressing change Topical Primary Dressing Hydrofera Blue Ready Foam, 2.5 x2.5 in Discharge Instruction: Apply to wound bed as instructed Secondary Dressing ABD Pad, 5x9 Discharge Instruction: Apply over primary dressing as directed. Secured With 58M Medipore H Soft Cloth Surgical T 4 x 2 (in/yd) ape Discharge Instruction: Secure dressing with tape as directed. Compression Wrap Compression Stockings Add-Ons Electronic Signature(s) Signed: 10/06/2021 4:38:08 PM By: Lorrin Jackson Entered By: Lorrin Jackson on 10/06/2021 08:32:58 -------------------------------------------------------------------------------- Wound Assessment Details Patient Name: Date of Service: Kucinski, [name] 10/06/2021 8:00 A M Medical Record Number: [id number] Patient Account Number: [id number] Date of Birth/Sex: Treating RN: June 02, 1986 (35 y.o. [name] Primary Care Keilin Gamboa: [name], [name] Other Clinician: Referring Andrian Urbach: Treating Tuck Dulworth/Extender: Dutch Gray, [name]: 300 Wound Status Wound Number: 51 Primary Etiology: Trauma, Other Wound Location: Left, Anterior Lower Leg Wound Status: Open Wounding Event: Trauma Comorbid History: Sleep Apnea, Hypertension, Paraplegia Date Acquired: 01/26/2021 Weeks Of Treatment: 33 Clustered Wound: No Photos Wound Measurements Length: (cm)  1.9 Width: (cm) 1 Depth: (cm) 0.1 Area: (cm) 1.492 Volume: (cm) 0.149 % Reduction in Area: -352.1% % Reduction in Volume: -351.5% Epithelialization: Small (1-33%) Tunneling: No Undermining: No Wound Description Classification: Full Thickness Without Exposed Support Structures Wound Margin: Distinct, outline attached Exudate Amount: Medium Exudate Type: Serosanguineous Exudate Color: red, brown Foul Odor After Cleansing: No Slough/Fibrino No Wound Bed Granulation Amount: Large (67-100%) Exposed Structure Granulation Quality: Red, Hyper-granulation, Friable Fascia Exposed: No Necrotic Amount: None Present (0%) Fat Layer (Subcutaneous Tissue) Exposed: Yes Tendon Exposed: No Muscle Exposed: No Joint Exposed: No Bone Exposed: No Treatment Notes Wound #51 (Lower Leg) Wound Laterality: Left, Anterior Cleanser Soap and Water Discharge Instruction: May shower and wash wound with dial antibacterial soap and water prior to dressing change. Peri-Wound Care Topical Primary Dressing Hydrofera Blue Ready Foam, 4x5 in Discharge Instruction: Apply to wound bed as instructed Secondary Dressing Woven Gauze Sponge, Non-Sterile 4x4 in Discharge Instruction: Apply over primary  dressing as directed. ABD Pad, 5x9 Discharge Instruction: Apply over primary dressing as directed. Secured With [employer], 4.5x3.1 (in/yd) Discharge Instruction: Secure with Kerlix as directed. Compression Wrap Compression Stockings Add-Ons Electronic Signature(s) Signed: 10/06/2021 4:38:08 PM By: Lorrin Jackson Entered By: Lorrin Jackson on 10/06/2021 08:18:55 -------------------------------------------------------------------------------- Wound Assessment Details Patient Name: Date of Service: Snelson, [name] 10/06/2021 8:00 A M Medical Record Number: [id number] Patient Account Number: [id number] Date of Birth/Sex: Treating RN: 1986/09/27 (35 y.o. [name] Primary Care Sidhant Helderman:  [name], [name] Other Clinician: Referring Emmory Solivan: Treating Valrie Jia/Extender: Dutch Gray, [name]: 300 Wound Status Wound Number: 52 Primary Etiology: Trauma, Other Wound Location: Right, Dorsal Foot Wound Status: Open Wounding Event: Gradually Appeared Comorbid History: Sleep Apnea, Hypertension, Paraplegia Date Acquired: 03/27/2021 Weeks Of Treatment: 27 Clustered Wound: No Photos Wound Measurements Length: (cm) 2.5 Width: (cm) 5 Depth: (cm) 0.1 Area: (cm) 9.817 Volume: (cm) 0.982 % Reduction in Area: -420.8% % Reduction in Volume: -422.3% Epithelialization: None Tunneling: No Undermining: No Wound Description Classification: Full Thickness Without Exposed Support Structures Wound Margin: Distinct, outline attached Exudate Amount: Medium Exudate Type: Purulent Exudate Color: yellow, brown, green Foul Odor After Cleansing: No Slough/Fibrino Yes Wound Bed Granulation Amount: Medium (34-66%) Exposed Structure Granulation Quality: Pink Fascia Exposed: No Necrotic Amount: Medium (34-66%) Fat Layer (Subcutaneous Tissue) Exposed: Yes Necrotic Quality: Adherent Slough Tendon Exposed: No Muscle Exposed: No Joint Exposed: No Bone Exposed: No Treatment Notes Wound #52 (Foot) Wound Laterality: Dorsal, Right Cleanser Soap and Water Discharge Instruction: May shower and wash wound with dial antibacterial soap and water prior to dressing change. Peri-Wound Care Topical Gentamicin Discharge Instruction: As directed by physician Primary Dressing Hydrofera Blue Ready Foam, 2.5 x2.5 in Discharge Instruction: Apply to wound bed as instructed Secondary Dressing Woven Gauze Sponge, Non-Sterile 4x4 in Discharge Instruction: Apply over primary dressing as directed. Secured With [employer], 4.5x3.1 (in/yd) Discharge Instruction: Secure with Kerlix as directed. Transpore Surgical Tape, 2x10 (in/yd) Discharge Instruction: Secure dressing  with tape as directed. Compression Wrap Compression Stockings Add-Ons Electronic Signature(s) Signed: 10/06/2021 4:38:08 PM By: Lorrin Jackson Entered By: Lorrin Jackson on 10/06/2021 08:20:45 -------------------------------------------------------------------------------- Wound Assessment Details Patient Name: Date of Service: Featherly, [name] 10/06/2021 8:00 A M Medical Record Number: [id number] Patient Account Number: [id number] Date of Birth/Sex: Treating RN: 05/05/1986 (35 y.o. [name] Primary Care Wynetta Seith: [name], [name] Other Clinician: Referring Laikynn Pollio: Treating Garrie Woodin/Extender: [name]: 300 Wound Status Wound Number: 54 Primary Etiology: Pressure Ulcer Wound Location: Left, Distal Ischium Wound Status: Converted Wounding Event: Pressure Injury Comorbid History: Sleep Apnea, Hypertension, Paraplegia Date Acquired: 05/11/2021 Weeks Of Treatment: 21 Clustered Wound: No Wound Measurements % Reduction in Area: % Reduction in Volume: Epithelialization: Medium (34-66%) Wound Description Classification: Category/Stage III Wound Margin: Distinct, outline attached Exudate Amount: Medium Exudate Type: Sanguinous Exudate Color: red Foul Odor After Cleansing: No Slough/Fibrino No Wound Bed Granulation Amount: Large (67-100%) Exposed Structure Granulation Quality: Red, Friable Fascia Exposed: No Necrotic Amount: None Present (0%) Fat Layer (Subcutaneous Tissue) Exposed: Yes Tendon Exposed: No Muscle Exposed: No Joint Exposed: No Bone Exposed: No Electronic Signature(s) Signed: 10/06/2021 4:38:08 PM By: Lorrin Jackson Entered By: Lorrin Jackson on 10/06/2021 08:39:49 -------------------------------------------------------------------------------- Wound Assessment Details Patient Name: Date of Service: Aamodt, [name] 10/06/2021 8:00 A M Medical Record Number: [id number] Patient Account Number: [id number] Date  of Birth/Sex: Treating RN: 11/27/1985 (36 y.o. [name] Primary Care Jennifier Smitherman: [name], [name] Other Clinician: Referring  Dynasty Holquin: Treating Marabelle Cushman/Extender: [name]: 300 Wound Status Wound Number: 55 Primary Etiology: Neuropathic Ulcer-Non Diabetic Wound Location: Left T Great oe Wound Status: Open Wounding Event: Blister Comorbid History: Sleep Apnea, Hypertension, Paraplegia Date Acquired: 05/26/2021 Weeks Of Treatment: 19 Clustered Wound: No Photos Wound Measurements Length: (cm) 0.4 Width: (cm) 0.2 Depth: (cm) 0.2 Area: (cm) 0.063 Volume: (cm) 0.013 % Reduction in Area: 92% % Reduction in Volume: 83.5% Epithelialization: None Tunneling: No Undermining: No Wound Description Classification: Full Thickness With Exposed Support Struct Wound Margin: Distinct, outline attached Exudate Amount: Medium Exudate Type: Purulent Exudate Color: yellow, brown, green ures Foul Odor After Cleansing: No Slough/Fibrino No Wound Bed Granulation Amount: Large (67-100%) Exposed Structure Granulation Quality: Red, Pink Fascia Exposed: No Necrotic Amount: None Present (0%) Fat Layer (Subcutaneous Tissue) Exposed: Yes Tendon Exposed: No Muscle Exposed: No Joint Exposed: No Bone Exposed: No Treatment Notes Wound #55 (Toe Great) Wound Laterality: Left Cleanser Soap and Water Discharge Instruction: May shower and wash wound with dial antibacterial soap and water prior to dressing change. Peri-Wound Care Topical Primary Dressing Hydrofera Blue Ready Foam, 2.5 x2.5 in Discharge Instruction: Apply to wound bed as instructed Secondary Dressing Woven Gauze Sponge, Non-Sterile 4x4 in Discharge Instruction: Apply over primary dressing as directed. Secured With [employer], 4.5x3.1 (in/yd) Discharge Instruction: Secure with Kerlix as directed. Transpore Surgical Tape, 2x10 (in/yd) Discharge Instruction: Secure dressing with  tape as directed. Compression Wrap Compression Stockings Add-Ons Electronic Signature(s) Signed: 10/06/2021 4:38:08 PM By: Lorrin Jackson Entered By: Lorrin Jackson on 10/06/2021 08:25:58 -------------------------------------------------------------------------------- Wound Assessment Details Patient Name: Date of Service: [place], [name] 10/06/2021 8:00 A M Medical Record Number: [id number] Patient Account Number: [id number] Date of Birth/Sex: Treating RN: [birth year] (35 y.o. [name] Primary Care Chudney Scheffler: [name], [name] Other Clinician: Referring Bambie Pizzolato: Treating Ivin Rosenbloom/Extender: Dutch Gray, [name]: 300 Wound Status Wound Number: [id number] Primary Etiology: Neuropathic Ulcer-Non Diabetic Wound Location: Left, Dorsal Foot Wound Status: Open Wounding Event: Gradually Appeared Comorbid History: Sleep Apnea, Hypertension, Paraplegia Date Acquired: 07/11/2021 Weeks Of Treatment: 11 Clustered Wound: No Photos Wound Measurements Length: (cm) 2.4 Width: (cm) 2.6 Depth: (cm) 0.1 Area: (cm) 4.901 Volume: (cm) 0.49 % Reduction in Area: 21.2% % Reduction in Volume: 21.2% Epithelialization: Small (1-33%) Tunneling: No Undermining: No Wound Description Classification: Full Thickness Without Exposed Support Structures Wound Margin: Distinct, outline attached Exudate Amount: Medium Exudate Type: Purulent Exudate Color: yellow, brown, green Foul Odor After Cleansing: No Slough/Fibrino Yes Wound Bed Granulation Amount: Small (1-33%) Exposed Structure Granulation Quality: Pink Fascia Exposed: No Necrotic Amount: Large (67-100%) Fat Layer (Subcutaneous Tissue) Exposed: Yes Necrotic Quality: Adherent Slough Tendon Exposed: No Muscle Exposed: No Joint Exposed: No Bone Exposed: No Treatment Notes Wound #56 (Foot) Wound Laterality: Dorsal, Left Cleanser Soap and Water Discharge Instruction: May shower and wash wound with dial  antibacterial soap and water prior to dressing change. Peri-Wound Care Topical Gentamicin Discharge Instruction: Apply directly to wound bed, under alginate Primary Dressing Hydrofera Blue Classic Foam, 4x4 in Discharge Instruction: Moisten with saline prior to applying to wound bed Secondary Dressing Woven Gauze Sponge, Non-Sterile 4x4 in Discharge Instruction: Apply over primary dressing as directed. Secured With [employer], 4.5x3.1 (in/yd) Discharge Instruction: Secure with Kerlix as directed. Transpore Surgical Tape, 2x10 (in/yd) Discharge Instruction: Secure dressing with tape as directed. Compression Wrap Compression Stockings Add-Ons Electronic Signature(s) Signed: 10/06/2021 4:38:08 PM By: Lorrin Jackson Entered By: Lorrin Jackson on 10/06/2021 08:24:19 -------------------------------------------------------------------------------- Wound Assessment Details  Patient Name: Date of Service: [place], [name] 10/06/2021 8:00 A M Medical Record Number: [id number] Patient Account Number: [id number] Date of Birth/Sex: Treating RN: Jun 06, 1986 (35 y.o. [name] Primary Care Giavanna Kang: [name], [name] Other Clinician: Referring Adelheid Hoggard: Treating Warren Lindahl/Extender: Dutch Gray, [name]: 300 Wound Status Wound Number: 57 Primary Etiology: Neuropathic Ulcer-Non Diabetic Wound Location: Right, Plantar Foot Wound Status: Open Wounding Event: Gradually Appeared Comorbid History: Sleep Apnea, Hypertension, Paraplegia Date Acquired: 08/25/2021 Weeks Of Treatment: 6 Clustered Wound: No Photos Wound Measurements Length: (cm) 1.3 Width: (cm) 0.8 Depth: (cm) 0.1 Area: (cm) 0.817 Volume: (cm) 0.082 % Reduction in Area: 20% % Reduction in Volume: 19.6% Epithelialization: Small (1-33%) Tunneling: No Undermining: No Wound Description Classification: Full Thickness Without Exposed Support Structures Wound Margin: Distinct, outline  attached Exudate Amount: Medium Exudate Type: Purulent Exudate Color: yellow, brown, green Foul Odor After Cleansing: No Slough/Fibrino Yes Wound Bed Granulation Amount: Medium (34-66%) Exposed Structure Granulation Quality: Red Fascia Exposed: No Necrotic Amount: Medium (34-66%) Fat Layer (Subcutaneous Tissue) Exposed: Yes Necrotic Quality: Adherent Slough Tendon Exposed: No Muscle Exposed: No Joint Exposed: No Bone Exposed: No Treatment Notes Wound #57 (Foot) Wound Laterality: Plantar, Right Cleanser Soap and Water Discharge Instruction: May shower and wash wound with dial antibacterial soap and water prior to dressing change. Peri-Wound Care Topical Primary Dressing Hydrofera Blue Ready Foam, 2.5 x2.5 in Discharge Instruction: Apply to wound bed as instructed Secondary Dressing Woven Gauze Sponge, Non-Sterile 4x4 in Discharge Instruction: Apply over primary dressing as directed. Secured With [employer], 4.5x3.1 (in/yd) Discharge Instruction: Secure with Kerlix as directed. 20M Medipore H Soft Cloth Surgical T 4 x 2 (in/yd) ape Discharge Instruction: Secure dressing with tape as directed. Compression Wrap Compression Stockings Add-Ons Electronic Signature(s) Signed: 10/06/2021 4:38:08 PM By: Lorrin Jackson Entered By: Lorrin Jackson on 10/06/2021 08:19:48 -------------------------------------------------------------------------------- Wound Assessment Details Patient Name: Date of Service: [place], [name] 10/06/2021 8:00 A M Medical Record Number: [id number] Patient Account Number: [id number] Date of Birth/Sex: Treating RN: November 24, 1985 (35 y.o. [name] Primary Care Jameelah Watts: [place], [place] Other Clinician: Referring Annaleise Burger: Treating Meganne Rita/Extender: [name]: 300 Wound Status Wound Number: 58 Primary Etiology: Trauma, Other Wound Location: Right, Lateral Lower Leg Wound Status: Open Wounding  Event: Gradually Appeared Comorbid History: Sleep Apnea, Hypertension, Paraplegia Date Acquired: 09/23/2021 Weeks Of Treatment: 0 Clustered Wound: No Photos Wound Measurements Length: (cm) 1 Width: (cm) 1.4 Depth: (cm) 0.1 Area: (cm) 1. Volume: (cm) 0. % Reduction in Area: 0% % Reduction in Volume: 0% Epithelialization: None 1 Tunneling: No 11 Undermining: No Wound Description Classification: Full Thickness Without Exposed Support Struct Wound Margin: Distinct, outline attached Exudate Amount: Medium Exudate Type: Serosanguineous Exudate Color: red, brown ures Foul Odor After Cleansing: No Slough/Fibrino No Wound Bed Granulation Amount: Large (67-100%) Exposed Structure Granulation Quality: Red Fascia Exposed: No Necrotic Amount: None Present (0%) Fat Layer (Subcutaneous Tissue) Exposed: Yes Tendon Exposed: No Muscle Exposed: No Joint Exposed: No Bone Exposed: No Treatment Notes Wound #58 (Lower Leg) Wound Laterality: Right, Lateral Cleanser Soap and Water Discharge Instruction: May shower and wash wound with dial antibacterial soap and water prior to dressing change. Peri-Wound Care Topical Primary Dressing Hydrofera Blue Ready Foam, 2.5 x2.5 in Discharge Instruction: Apply to wound bed as instructed Secondary Dressing Woven Gauze Sponge, Non-Sterile 4x4 in Discharge Instruction: Apply over primary dressing as directed. Secured With [employer], 4.5x3.1 (in/yd) Discharge Instruction: Secure with Kerlix as directed. Transpore Surgical  Tape, 2x10 (in/yd) Discharge Instruction: Secure dressing with tape as directed. Compression Wrap Compression Stockings Add-Ons Electronic Signature(s) Signed: 10/06/2021 4:38:08 PM By: Lorrin Jackson Entered By: Lorrin Jackson on 10/06/2021 08:21:49 -------------------------------------------------------------------------------- Vitals Details Patient Name: Date of Service: [place], [name] 10/06/2021 8:00 A  M Medical Record Number: [id number] Patient Account Number: [id number] Date of Birth/Sex: Treating RN: 1985-12-14 (35 y.o. [name] Primary Care Jaspreet Bodner: [place], [place] Other Clinician: Referring Jonn Chaikin: Treating Spruha Weight/Extender: Dutch Gray, [name]: 300 Vital Signs Time Taken: 08:02 Temperature (F): 98.6 Height (in): 70 Pulse (bpm): 111 Weight (lbs): 216 Respiratory Rate (breaths/min): 16 Body Mass Index (BMI): 31 Blood Pressure (mmHg): 133/79 Reference Range: 80 - 120 mg / dl Electronic Signature(s) Signed: 10/06/2021 4:38:08 PM By: Lorrin Jackson Entered By: Lorrin Jackson on 10/06/2021 08:03:07

## 2021-10-06 NOTE — Progress Notes (Signed)
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 first and second and the right first second toes which I been treating for tenia pedis 06/02/17; left lateral leg venous insufficiency wound. We have been using Hydrofera Blue. Somewhat smaller from the wound circumference. The surface of the wound remains a bit on it almost epithelialized sedation in appearance. I use an open curette today debridement in the surface of all of this especially the edges Small open wounds remaining on the dorsal right first and second toe interspace and the plantar left first second toe and her face on the left 06/09/17; wound on the left lateral leg continues to be smaller but very gradual and very dry surface using Hydrofera Blue 06/16/17 requires weekly debridements now on the left lateral leg although this continues to contract. I changed to silver collagen last week because of dryness of the wound bed. Using Iodoflex to the areas on his first and second toes/web space bilaterally 06/24/17; patient with history of paraplegia also chronic venous insufficiency with lymphedema. Has a very difficult wound on the left lateral leg. This has been gradually  reducing in terms of with but comes in with a very dry adherent surface. High  switch to silver collagen a week or so ago with hydrogel to keep the area moist. This is been refractory to multiple dressing attempts. He also has areas in his first and second toes bilaterally in the anterior and posterior web space. I had been using Iodoflex here after a prolonged course of silver alginate with ketoconazole was ineffective [question tinea pedis] 07/14/17; patient arrives today with a very difficult adherent material over his left lateral lower leg wound. He also has surrounding erythema and poorly controlled edema. He was switched his Santyl last visit which the nurses are applying once during his doctor visit and once on a nurse visit. He was also reduced to 2 layer compression I'm not exactly sure of the issue here. 07/21/17; better surface today after 1 week of Iodoflex. Significant cellulitis that we treated last week also better. [Doxycycline] 07/28/17 better surface today with now 2 weeks of Iodoflex. Significant cellulitis treated with doxycycline. He has now completed the doxycycline and he is back to his usual degree of chronic venous inflammation/stasis dermatitis. He reminds me he has had ablations surgery here 08/04/17; continued improvement with Iodoflex to the left lateral leg wound in terms of the surface of the wound although the dimensions are better. 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 Problematic areas on the plantar aspect of the first second toe web space on the left and the dorsal aspect of the first second toe web space on the right. At one point I felt these were probably related to chronic fungal infections in treated him aggressively for this although we have not made any improvement here. 08/11/17; left lateral leg. Surface continues to improve with the Iodoflex although we are not seeing much improvement in  overall wound dimensions. Areas on his plantar left foot and right foot show no improvement. In fact the right foot looks somewhat worse 08/18/17; left lateral leg. We changed to [facility] Blue last week after a prolonged course of Iodoflex which helps get the surface better. It appears that the wound with is improved. Continue with difficult areas on the left dorsal first second and plantar first second on the right 09/01/17; patient arrives in clinic today having had a temperature of 103 yesterday. He was seen in the ER and [facility]. The patient was concerned he could have cellulitis again in the right leg however they diagnosed him with a UTI and he is now on Keflex. He has a history of cellulitis which is been recurrent and difficult but this is been in the left leg, in the past 5 use doxycycline. He does in and out catheterizations at home which are risk factors for UTI 09/08/17; patient will be completing his Keflex this weekend. The erythema on the left leg is considerably better. He has a new wound today on the medial part of the right leg small superficial almost looks like a skin tear. He has worsening of the area on the right dorsal first and second toe. His major area on the left lateral leg is better. Using Hydrofera Blue on all areas 09/15/17; gradual reduction in width on the long wound in the left lateral leg. No debridement required. He also has wounds on the plantar aspect of his left first second toe web space and on the dorsal aspect of the right first second toe web space. 09/22/17; there continues to be very gradual improvements in the dimensions of the left lateral leg wound. He hasn't  round erythematous spot with might be pressure on his wheelchair. There is no evidence obviously of infection no purulence no warmth He has a dry scaled area on the plantar aspect of the left first second toe Improved area on the dorsal right first second toe. 09/29/17; left lateral leg wound  continues to improve in dimensions mostly with an is still a fairly long but increasingly narrow wound. He has a dry scaled area on the plantar aspect of his left first second toe web space Increasingly concerning area on the dorsal right first second toe. In fact I am concerned today about possible cellulitis around this wound. The areas extending up his second toe and although there is deformities here almost appears to abut on the nailbed. 10/06/17; left lateral leg wound continues to make very gradual progress. Tissue culture I did from the right first second toe dorsal foot last time grew MRSA and enterococcus which was vancomycin sensitive. This was not sensitive to clindamycin or doxycycline. He is allergic to Zyvox and sulfa we have therefore arrange for him to have dalvance infusion tomorrow. He is had this in the past and tolerated it well 10/20/17; left lateral leg wound continues to make decent progress. This is certainly reduced in terms of with there is advancing epithelialization.The cellulitis in the right foot looks better although he still has a deep wound in the dorsal aspect of the first second toe web space. Plantar left first toe web space on the left I think is making some progress 10/27/17; left lateral leg wound continues to make decent progress. Advancing epithelialization.using Hydrofera Blue The right first second toe web space wound is better-looking using silver alginate Improvement in the left plantar first second toe web space. Again using silver alginate 11/03/17 left lateral leg wound continues to make decent progress albeit slowly. Using [facility] The right per second toe web space continues to be a very problematic looking punched out wound. I obtained a piece of tissue for deep culture I did extensively treated this for fungus. It is difficult to imagine that this is a pressure area as the patient states other than going outside he doesn't really wear shoes at  home The left plantar first second toe web space looked fairly senescent. Necrotic edges. This required debridement change to [facility] Blue to all wound areas 11/10/17; left lateral leg wound continues to contract. Using Hydrofera Blue On the right dorsal first second toe web space dorsally. Culture I did of this area last week grew MRSA there is not an easy oral option in this patient was multiple antibiotic allergies or intolerances. This was only a rare culture isolate I'm therefore going to use Bactroban under silver alginate On the left plantar first second toe web space. Debridement is required here. This is also unchanged 11/17/17; left lateral leg wound continues to contract using Hydrofera Blue this is no longer the major issue. The major concern here is the right first second toe web space. He now has an open area going from dorsally to the plantar aspect. There is now wound on the inner lateral part of the first toe. Not a very viable surface on this. There is erythema spreading medially into the forefoot. No major change in the left first second toe plantar wound 11/24/17; left lateral leg wound continues to contract using Hydrofera Blue. Nice improvement today The right first second toe web space all of this looks a lot less angry than last week. I have given him clindamycin  and topical Bactroban for MRSA and terbinafine for the possibility of underlining tinea pedis that I could not control with ketoconazole. Looks somewhat better The area on the plantar left first second toe web space is weeping with dried debris around the wound 12/01/17; left lateral leg wound continues to contract he Hydrofera Blue. It is becoming thinner in terms of with nevertheless it is making good improvement. The right first second toe web space looks less angry but still a large necrotic-looking wounds starting on the plantar aspect of the right foot extending between the toes and now extensively on the base  of the right second toe. I gave him clindamycin and topical Bactroban for MRSA anterior benefiting for the possibility of underlying tinea pedis. Not looking better today The area on the left first/second toe looks better. Debrided of necrotic debris 12/05/17* the patient was worked in urgently today because over the weekend he found blood on his incontinence bad when he woke up. He was found to have an ulcer by his wife who does most of his wound care. He came in today for [place] to look at this. He has not had a history of wounds in his buttocks in spite of his paraplegia. 12/08/17; seen in follow-up today at his usual appointment. He was seen earlier this week and found to have a new wound on his buttock. We also follow him for wounds on the left lateral leg, left first second toe web space and right first second toe web space 12/15/17; we have been using Hydrofera Blue to the left lateral leg which has improved. The right first second toe web space has also improved. Left first second toe web space plantar aspect looks stable. The left buttock has worsened using Santyl. Apparently the buttock has drainage 12/22/17; we have been using Hydrofera Blue to the left lateral leg which continues to improve now 2 small wounds separated by normal skin. He tells [place] he had a fever up to 100 yesterday he is prone to UTIs but has not noted anything different. He does in and out catheterizations. The area between the first and second toes today does not look good necrotic surface covered with what looks to be purulent drainage and erythema extending into the third toe. I had gotten this to something that I thought look better last time however it is not look good today. He also has a necrotic surface over the buttock wound which is expanded. I thought there might be infection under here so I removed a lot of the surface with a #5 curet though nothing look like it really needed culturing. He is been using Santyl to this  area 12/27/17; his original wound on the left lateral leg continues to improve using Hydrofera Blue. I gave him samples of Baxdella although he was unable to take them out of fear for an allergic reaction ["lump in his throat"].the culture I did of the purulent drainage from his second toe last week showed both enterococcus and a set Enterobacter I was also concerned about the erythema on the bottom of his foot although paradoxically although this looks somewhat better today. 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 We've been using silver alginate here. They came up today that he sleeps in a recliner, I'm not sure why but I asked him to stop this 01/03/18; his original wound we've been using Hydrofera Blue is now separated into  2 areas. Ulcer on his left buttock is better he is off the recliner and sleeping in bed Finally both wound areas between his first and second toes also looks some better 01/10/18; his original wound on the left lateral leg is now separated into 2 wounds we've been using Hydrofera Blue Ulcer on his left buttock has some drainage. There is a small probing site going into muscle layer superiorly.using silver alginate -He arrives today with a deep tissue injury on the left heel The wound on the dorsal aspect of his first second toe on the left looks a lot betterusing silver alginate ketoconazole The area on the first second toe web space on the right also looks a lot bette 01/17/18; his original wound on the left lateral leg continues to progress using Hydrofera Blue Ulcer on his left buttock also is smaller surface healthier except for a small probing site going into the muscle layer superiorly. 2.4 cm of tunneling in this area DTI on his left heel we have only been offloading. Looks better than last week no threatened open no evidence of infection the wound on the dorsal aspect of the  first second toe on the left continues to look like it's regressing we have only been using silver alginate and terbinafine orally The area in the first second toe web space on the right also looks to be a lot better using silver alginate and terbinafine I think this was prompted by tinea pedis 01/31/18; the patient was hospitalized in [place] last week apparently for a complicated UTI. He was discharged on cefepime he does in and out catheterizations. In the hospital he was discovered M I don't mild elevation of AST and ALT and the terbinafine was stopped.predictably the pressure ulcer on s his buttock looks betterusing silver alginate. The area on the left lateral leg also is better using Hydrofera Blue. The area between the first and second toes on the left better. First and second toes on the right still substantial but better. Finally the DTI on the left heel has held together and looks like it's resolving 02/07/18-he is here in follow-up evaluation for multiple ulcerations. He has new injury to the lateral aspect of the last issue a pressure ulcer, he states this is from adhesive removal trauma. He states he has tried multiple adhesive products with no success. All other ulcers appear stable. The left heel DTI is resolving. We will continue with same treatment plan and follow-up next week. 02/14/18; follow-up for multiple areas. He has a new area last week on the lateral aspect of his pressure ulcer more over the posterior trochanter. The original pressure ulcer looks quite stable has healthy granulation. 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 Almost fully epithelialized on the original superior area using Hydrofera Blue DTI on the left heel has peeled off this week to reveal a small superficial wound under denuded skin and subcutaneous tissue Both areas between the first and second toes  look better including nothing open on the left 02/21/18; The patient's wounds on his left ischial tuberosity and posterior left greater trochanter actually looked better. He has a large area of irritation around the area which I think is contact dermatitis. I am doubtful that this is fungal His original wound on the left lateral calf continues to improve we have been using Hydrofera Blue There is no open area in the left first second toe  web space although there is a lot of thick callus The DTI on the left heel required debridement today of necrotic surface eschar and subcutaneous tissue using silver alginate Finally the area on the right first second toe webspace continues to contract using silver alginate and ketoconazole 02/28/18 Left ischial tuberosity wounds look better using silver alginate. Original wound on the left calf only has one small open area left using Hydrofera Blue DTI on the left heel required debridement mostly removing skin from around this wound surface. Using silver alginate The areas on the right first/second toe web space using silver alginate and ketoconazole 03/08/18 on evaluation today patient appears to be doing decently well as best I can tell in regard to his wounds. This is the first time that I have seen him as he generally is followed by Dr. Dellia Nims. With that being said none of his wounds appear to be infected he does have an area where there is some skin covering what appears to be a new wound on the left dorsal surface of his great toe. This is right at the nail bed. With that being said I do believe that debrided away some of the excess skin can be of benefit in this regard. Otherwise he has been tolerating the dressing changes without complication. 03/14/18; patient arrives today with the multiplicity of wounds that we are following. He has not been systemically unwell Original wound on the left lateral calf now only has 2 small open areas we've been using Hydrofera  Blue which should continue The deep tissue injury on the left heel requires debridement today. We've been using silver alginate The left first second toe and the right first second toe are both are reminiscence what I think was tinea pedis. Apparently some of the callus Surface between the toes was removed last week when it started draining. Purulent drainage coming from the wound on the ischial tuberosity on the left. 03/21/18-He is here in follow-up evaluation for multiple wounds. 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 He tolerated debridement. The only change to last week's recommendations is to discontinue antifungal cream between toes. He will follow-up next week 03/28/18; following up for multiple wounds;Concern this week is streaking redness and swelling in the right foot. He is going to need antibiotics for this. 03/31/18; follow-up for right foot cellulitis. Streaking redness and swelling in the right foot on 03/28/18. 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 I put him on clindamycin 300 mg every 6 and brought him in for a quick check. He has an open wound between his first and second toes on the right foot as a potential source. 04/04/18; Right foot cellulitis is resolving he is completing clindamycin. This is truly good news Left lateral calf wound which is initial wound only has one small open area inferiorly this is close to healing out. He has compression stockings. We will use Hydrofera Blue right down to the epithelialization of this Nonviable surface on the left heel which was initially pressure with a DTI. We've been using Hydrofera Blue. I'm going to switch this back to silver alginate Left first second toe/tinea pedis this looks better using silver alginate Right first second toe tinea pedis using silver alginate Large pressure ulcers on theLeft ischial tuberosity. Small wound here  Looks better. I am uncertain about the surface over the large wound. Using silver alginate 04/11/18; Cellulitis in the right foot is resolved Left lateral  calf wound which was his original wounds still has 2 tiny open areas remaining this is just about closed Nonviable surface on the left heel is better but still requires debridement Left first second toe/tinea pedis still open using silver alginate Right first second toe wound tinea pedis I asked him to go back to using ketoconazole and silver alginate Large pressure ulcers on the left ischial tuberosity this shear injury here is resolved. Wound is smaller. No evidence of infection using silver alginate 04/18/18; Patient arrives with an intense area of cellulitis in the right mid lower calf extending into the right heel area. Bright red and warm. Smaller area on the left anterior leg. He has a significant history of MRSA. He will definitely need antibioticsdoxycycline He now has 2 open areas on the left ischial tuberosity the original large wound and now a satellite area which I think was above his initial satellite areas. Not a wonderful surface on this satellite area surrounding erythema which looks like pressure related. His left lateral calf wound again his original wound is just about closed Left heel pressure injury still requiring debridement Left first second toe looks a lot better using silver alginate Right first second toe also using silver alginate and ketoconazole cream also looks better 04/20/18; the patient was worked in early today out of concerns with his cellulitis on the right leg. I had started him on doxycycline. This was 2 days ago. His wife was concerned about the swelling in the area. Also concerned about the left buttock. He has not been systemically unwell no fever chills. No nausea vomiting or diarrhea 04/25/18; the patient's left buttock wound is continued to deteriorate he is using Hydrofera Blue. He is still completing  clindamycin for the cellulitis on the right leg although all of this looks better. 05/02/18 Left buttock wound still with a lot of drainage and a very tightly adherent fibrinous necrotic surface. He has a deeper area superiorly The left lateral calf wound is still closed DTI wound on the left heel necrotic surface especially the circumference using Iodoflex Areas between his left first second toe and right first second toe both look better. Dorsally and the right first second toe he had a necrotic surface although at smaller. In using silver alginate and ketoconazole. I did a culture last week which was a deep tissue culture of the reminiscence of the open wound on the right first second toe dorsally. This grew a few Acinetobacter and a few methicillin-resistant staph aureus. Nevertheless the area actually this week looked better. I didn't feel the need to specifically address this at least in terms of systemic antibiotics. 05/09/18; wounds are measuring larger more drainage per our intake. We are using Santyl covered with alginate on the large superficial buttock wounds, Iodosorb on the left heel, ketoconazole and silver alginate to the dorsal first and second toes bilaterally. 05/16/18; The area on his left buttock better in some aspects although the area superiorly over the ischial tuberosity required an extensive debridement.using Santyl Left heel appears stable. Using Iodoflex The areas between his first and second toes are not bad however there is spreading erythema up the dorsal aspect of his left foot this looks like cellulitis again. He is insensate the erythema is really very brilliant.o Erysipelas He went to see an allergist days ago because he was itching part of this he had lab work done. This showed a white count of 15.1 with 70% neutrophils. Hemoglobin of 11.4 and a platelet count of 659,000.  Last white count we had in Epic was a 2-1/2 years ago which was 25.9 but he was ill at the  time. He was able to show me some lab work that was done by his primary physician the pattern is about the same. 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 But prompted me to go ahead and do x-rays of both feet and the pelvis rule out osteomyelitis. He also had a comprehensive metabolic panel this was reasonably normal his albumin was 3.7 liver function tests BUN/creatinine all normal 05/23/18; x-rays of both his feet from last week were negative for underlying pulmonary abnormality. The x-ray of his pelvis however showed mild irregularity in the left ischial which may represent some early osteomyelitis. The wound in the left ischial continues to get deeper clearly now exposed muscle. Each week necrotic surface material over this area. Whereas the rest of the wounds do not look so bad. The left ischial wound we have been using Santyl and calcium alginate T the left heel surface necrotic debris using Iodoflex o The left lateral leg is still healed Areas on the left dorsal foot and the right dorsal foot are about the same. There is some inflammation on the left which might represent contact dermatitis, fungal dermatitis I am doubtful cellulitis although this looks better than last week 05/30/18; CT scan done at Hospital did not show any osteomyelitis or abscess. Suggested the possibility of underlying cellulitis although I don't see a lot of evidence of this at the bedside The wound itself on the left buttock/upper thigh actually looks somewhat better. No debridement Left heel also looks better no debridement continue Iodoflex Both dorsal first second toe spaces appear better using Lotrisone. Left still required debridement 06/06/18; Intake reported some purulent looking drainage from the left gluteal wound. Using Santyl and calcium alginate Left heel looks better although still a nonviable surface requiring debridement The left dorsal foot first/second  webspace actually expanding and somewhat deeper. I may consider doing a shave biopsy of this area Right dorsal foot first/second webspace appears stable to improved. Using Lotrisone and silver alginate to both these areas 06/13/18 Left gluteal surface looks better. Now separated in the 2 wounds. No debridement required. Still drainage. We'll continue silver alginate Left heel continues to look better with Iodoflex continue this for at least another week Of his dorsal foot wounds the area on the left still has some depth although it looks better than last week. We've been using Lotrisone and silver alginate 06/20/18 Left gluteal continues to look better healthy tissue Left heel continues to look better healthy granulation wound is smaller. He is using Iodoflex and his long as this continues continue the Iodoflex Dorsal right foot looks better unfortunately dorsal left foot does not. There is swelling and erythema of his forefoot. He had minor trauma to this several days ago but doesn't think this was enough to have caused any tissue injury. Foot looks like cellulitis, we have had this problem before 06/27/18 on evaluation today patient appears to be doing a little worse in regard to his foot ulcer. 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 Both of which would really be his only options for treating this infection. In the past he has been given and effusion of Orbactiv. This is done very well for him  in the past again it's one time dosing IV antibiotic therapy. Subsequently I do believe this is something we're gonna need to see about doing at this point in time. Currently his other wounds seem to be doing somewhat better in my pinion I'm pretty happy in that regard. 07/03/18 on evaluation today patient's wounds actually appear to be doing fairly well. He has been  tolerating the dressing changes without complication. All in all he seems to be showing signs of improvement. In regard to the antibiotics he has been dealing with infectious disease since I saw him last week as far as getting this scheduled. In the end [facility]'s [facility] this coming Friday. In the meantime he has been continuing to perform the dressing changes in such as previous. There does not appear to be any evidence of infection worsengin at this time. 07/10/18; Since I last saw this man 2 weeks ago things have actually improved. IV antibiotics of resulted in less forefoot erythema although there is still some present. He is not systemically unwell Left buttock wounds 2 now have no depth there is increased epithelialization Using silver alginate Left heel still requires debridement using Iodoflex Left dorsal foot still with a sizable wound about the size of a border but healthy granulation Right dorsal foot still with a slitlike area using silver alginate 07/18/18; the patient's cellulitis in the left foot is improved in fact I think it is on its way to resolving. Left buttock wounds 2 both look better although the larger one has hypertension granulation we've been using silver alginate Left heel has some thick circumferential redundant skin over the wound edge which will need to be removed today we've been using Iodoflex Left dorsal foot is still a sizable wound required debridement using silver alginate The right dorsal foot is just about closed only a small open area remains here 07/25/18; left foot cellulitis is resolved Left buttock wounds 2 both look better. Hyper-granulation on the major area Left heel as some debris over the surface but otherwise looks a healthier wound. Using silver collagen Right dorsal foot is just about closed 07/31/18; arrives with our intake nurse worried about purulent drainage from the buttock. We had  hyper-granulation here last week His buttock wounds 2 continue to look better Left heel some debris over the surface but measuring smaller. Right dorsal foot unfortunately has openings between the toes Left foot superficial wound looks less aggravated. 08/07/18 Buttock wounds continue to look better although some of her granulation and the larger medial wound. silver alginate Left heel continues to look a lot better.silver collagen Left foot superficial wound looks less stable. Requires debridement. He has a new wound superficial area on the foot on the lateral dorsal foot. Right foot looks better using silver alginate without Lotrisone 08/14/2018; patient was in the ER last week diagnosed with a UTI. He is now on Cefpodoxime and Macrodantin. Buttock wounds continued to be smaller. Using silver alginate Left heel continues to look better using silver collagen Left foot superficial wound looks as though it is improving Right dorsal foot area is just about healed. 08/21/2018; patient is completed his antibiotics for his UTI. He has 2 open areas on the buttocks. There is still not closed although the surface looks satisfactory. Using silver alginate Left heel continues to improve using silver collagen The bilateral dorsal foot areas which are at the base of his first and second toes/possible tinea pedis  are actually stable on the left but worse on the right. The area on the left required debridement of necrotic surface. After debridement I obtained a specimen for PCR culture. The right dorsal foot which is been just about healed last week is now reopened 08/28/2018; culture done on the left dorsal foot showed coag negative staph both staph epidermidis and Lugdunensis. I think this is worthwhile initiating systemic treatment. I will use doxycycline given his long list of allergies. The area on the left heel slightly improved but still requiring debridement. The large wound on the buttock is just  about closed whereas the smaller one is larger. Using silver alginate in this area 09/04/2018; patient is completing his doxycycline for the left foot although this continues to be a very difficult wound area with very adherent necrotic debris. We are using silver alginate to all his wounds right foot left foot and the small wounds on his buttock, silver collagen on the left heel. 09/11/2018; once again this patient has intense erythema and swelling of the left forefoot. Lesser degrees of erythema in the right foot. He has a long list of allergies and intolerances. I will reinstitute doxycycline. 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 Using silver alginate Left heel also looks better using silver collagen Unfortunately both the areas on his feet look worse. The area on the left first second webspace is now gone through to the plantar part of his foot. The area on the left foot anteriorly is irritated with erythema and swelling in the forefoot. 09/25/2018 His wound on the left plantar heel looks better. Using silver collagen The area on the left buttock 2 small remnant areas. One is closed one is still open. Using silver alginate The areas between both his first and second toes look worse. This in spite of long-standing antifungal therapy with ketoconazole and silver alginate which should have antifungal activity He has small areas around his original wound on the left calf one is on the bottom of the original scar tissue and one superiorly both of these are small and superficial but again given wound history in this site this is worrisome 10/02/2018 Left plantar heel continues to gradually contract using silver collagen Left buttock wound is unchanged using silver alginate The areas on his dorsal feet between his first and second toes bilaterally look about the same. 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 Small open area here. We have been using silver alginate 10/09/2018; Left plantar heel continues to look healthy and contract. No debridement is required Left buttock slightly smaller with a tape injury wound just below which was new this week Dorsal feet somewhat improved I have been using clindamycin Left lateral looks lower extremity the actual open area looks worse although a lot of this is epithelialized. I am going to change to silver collagen today He has a lot more swelling in the right leg although this is not pitting not red and not particularly warm there is a lot of spasm in the right leg usually indicative of people with paralysis of some underlying discomfort. We have reviewed his vascular status from 2017 he had a left greater saphenous vein ablation. I wonder about referring him back to vascular surgery if the area on the left leg continues  to deteriorate. 10/16/2018 in today for follow-up and management of multiple lower extremity ulcers. His left Buttock wound is much lower smaller and almost closed completely. The wound to the left ankle has began to reopen with Epithelialization and some adherent slough. He has multiple new areas to the left foot and leg. The left dorsal foot without much improvement. Wound present between left great webspace and 2nd toe. Erythema and edema present right leg. Right LE ultrasound obtained on 10/10/18 was negative for DVT . 10/23/2018; Left buttock is closed over. Still dry macerated skin but there is no open wound. I suspect this is chronic pressure/moisture Left lateral calf is quite a bit worse than when I saw this last. There is clearly drainage here he has macerated skin into the left plantar heel. We will change the primary dressing to alginate Left dorsal foot has some improvement in overall  wound area. Still using clindamycin and silver alginate Right dorsal foot about the same as the left using clindamycin and silver alginate The erythema in the right leg has resolved. He is DVT rule out was negative Left heel pressure area required debridement although the wound is smaller and the surface is health 10/26/2018 The patient came back in for his nurse check today predominantly because of the drainage coming out of the left lateral leg with a recent reopening of his original wound on the left lateral calf. He comes in today with a large amount of surrounding erythema around the wound extending from the calf into the ankle and even in the area on the dorsal foot. He is not systemically unwell. He is not febrile. Nevertheless this looks like cellulitis. We have been using silver alginate to the area. I changed him to a regular visit and I am going to prescribe him doxycycline. The rationale here is a long list of medication intolerances and a history of MRSA. I did not see anything that I thought would provide a valuable culture 10/30/2018 Follow-up from his appointment 4 days ago with really an extensive area of cellulitis in the left calf left lateral ankle and left dorsal foot. I put him on doxycycline. He has a long list of medication allergies which are true allergy reactions. 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 In any case he is a lot better today. The erythema especially in the anterior and lateral left calf is better. He still has left ankle erythema. He also is complaining about increasing edema in the right leg we have only been using Kerlix Coban and he has been doing the wraps at home. Finally he has a spotty rash on the medial part of his upper left calf which looks like folliculitis or perhaps wrap occlusion type injury. Small superficial macules not pustules 11/06/18 patient arrives today with again a  considerable degree of erythema around the wound on the left lateral calf extending into the dorsal ankle and dorsal foot. This is a lot worse than when I saw this last week. He is on doxycycline really with not a lot of improvement. He has not been systemically unwell Wounds on the; left heel actually looks improved. Original area on the left foot and proximity to the first and second toes looks about the same. He has superficial areas on the dorsal foot, anterior calf and then the reopening of his original wound on the left lateral calf which looks about the same The only area he has  on the right is the dorsal webspace first and second which is smaller. He has a large area of dry erythematous skin on the left buttock small open area here. 11/13/2018; the patient arrives in much better condition. The erythema around the wound on the left lateral calf is a lot better. Not sure whether this was the clindamycin or the TCA and ketoconazole or just in the improvement in edema control [stasis dermatitis]. In any case this is a lot better. The area on the left heel is very small and just about resolved using silver collagen we have been using silver alginate to the areas on his dorsal feet 11/20/2018; his wounds include the left lateral calf, left heel, dorsal aspects of both feet just proximal to the first second webspace. He is stable to slightly improved. I did not think any changes to his dressings were going to be necessary 11/27/2018 he has a reopening on the left buttock which is surrounded by what looks like tinea or perhaps some other form of dermatitis. The area on the left dorsal foot has some erythema around it I have marked this area but I am not sure whether this is cellulitis or not. Left heel is not closed. Left calf the reopening is really slightly longer and probably worse 1/13; in general things look better and smaller except for the left dorsal foot. Area on the left heel is just about  closed, left buttock looks better only a small wound remains in the skin looks better [using Lotrisone] 1/20; the area on the left heel only has a few remaining open areas here. Left lateral calf about the same in terms of size, left dorsal foot slightly larger right lateral foot still not closed. The area on the left buttock has no open wound and the surrounding skin looks a lot better 1/27; the area on the left heel is closed. Left lateral calf better but still requiring extensive debridements. The area on his left buttock is closed. He still has the open areas on the left dorsal foot which is slightly smaller in the right foot which is slightly expanded. We have been using Iodoflex on these areas as well 2/3; left heel is closed. Left lateral calf still requiring debridement using Iodoflex there is no open area on his left buttock however he has dry scaly skin over a large area of this. Not really responding well to the Lotrisone. Finally the areas on his dorsal feet at the level of the first second webspace are slightly smaller on the right and about the same on the left. Both of these vigorously debrided with Anasept and gauze 2/10; left heel remains closed he has dry erythematous skin over the left buttock but there is no open wound here. Left lateral leg has come in and with. Still requiring debridement we have been using Iodoflex here. Finally the area on the left dorsal foot and right dorsal foot are really about the same extremely dry callused fissured areas. He does not yet have a dermatology appointment 2/17; left heel remains closed. He has a new open area on the left buttock. The area on the left lateral calf is bigger longer and still covered in necrotic debris. No major change in his foot areas bilaterally. I am awaiting for a dermatologist to look on this. We have been using ketoconazole I do not know that this is been doing any good at all. 2/24; left heel remains closed. The left  buttock wound that was new  reopening last week looks better. The left lateral calf appears better also although still requires debridement. The major area on his foot is the left first second also requiring debridement. We have been putting Prisma on all wounds. I do not believe that the ketoconazole has done too much good for his feet. He will use Lotrisone I am going to give him a 2-week course of terbinafine. We still do not have a dermatology appointment 3/2 left heel remains closed however there is skin over bone in this area I pointed this out to him today. The left buttock wound is epithelialized but still does not look completely stable. The area on the left leg required debridement were using silver collagen here. With regards to his feet we changed to Lotrisone last week and silver alginate. 3/9; left heel remains closed. Left buttock remains closed. The area on the right foot is essentially closed. The left foot remains unchanged. Slightly smaller on the left lateral calf. Using silver collagen to both of these areas 3/16-Left heel remains closed. Area on right foot is closed. Left lateral calf above the lateral malleolus open wound requiring debridement with easy bleeding. Left dorsal wound proximal to first toe also debrided. Left ischial area open new. Patient has been using Prisma with wrapping every 3 days. Dermatology appointment is apparently tomorrow.Patient has completed his terbinafine 2-week course with some apparent improvement according to him, there is still flaking and dry skin in his foot on the left 3/23; area on the right foot is reopened. The area on the left anterior foot is about the same still a very necrotic adherent surface. He still has the area on the left leg and reopening is on the left buttock. He apparently saw dermatology although I do not have a note. According to the patient who is usually fairly well informed they did not have any good ideas. Put him on  oral terbinafine which she is been on before. 3/30; using silver collagen to all wounds. Apparently his dermatologist put him on doxycycline and rifampin presumably some culture grew staph. I do not have this result. He remains on terbinafine although I have used terbinafine on him before 4/6; patient has had a fairly substantial reopening on the right foot between the first and second toes. He is finished his terbinafine and I believe is on doxycycline and rifampin still as prescribed by dermatology. We have been using silver collagen to all his wounds although the patient reports that he thinks silver alginate does better on the wounds on his buttock. 4/13; the area on his left lateral calf about the same size but it did not require debridement. Left dorsal foot just proximal to the webspace between the first and second toes is about the same. Still nonviable surface. I note some superficial bronze discoloration of the dorsal part of his foot Right dorsal foot just proximal to the first and second toes also looks about the same. I still think there may be the same discoloration I noted above on the left Left buttock wound looks about the same 4/20; left lateral calf appears to be gradually contracting using silver collagen. 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 The left dorsal foot wound is debrided of tightly adherent necrotic debris and really cleans up quite nicely. The right area is worse with expansion. I did not debride this it is now over the base of the second toe The area on his left buttock is smaller  no debridement is required using silver collagen 5/4; left calf continues to make good progress. He arrives with erythema around the wounds on his dorsal foot which even extends to the plantar aspect. Very concerning for coexistent infection. He is finished the erythromycin I gave him for possible erythrasma this does not seem to  have helped. The area on the left foot is about the same base of the dorsal toes Is area on the buttock looks improved on the left 5/11; left calf and left buttock continued to make good progress. Left foot is about the same to slightly improved. Major problem is on the right foot. He has not had an x-ray. Deep tissue culture I did last week showed both Enterobacter and E. coli. I did not change the doxycycline I put him on empirically although neither 1 of these were plated to doxycycline. He arrives today with the erythema looking worse on both the dorsal and plantar foot. Macerated skin on the bottom of the foot. he has not been systemically unwell 5/18-Patient returns at 1 week, left calf wound appears to be making some progress, left buttock wound appears slightly worse than last time, left foot wound looks slightly better, right foot redness is marginally better. X-ray of both feet show no air or evidence of osteomyelitis. Patient is finished his Omnicef and terbinafine. He continues to have macerated skin on the bottom of the left foot as well as right 5/26; left calf wound is better, left buttock wound appears to have multiple small superficial open areas with surrounding macerated skin. X-rays that I did last time showed no evidence of osteomyelitis in either foot. He is finished cefdinir and doxycycline. I do not think that he was on terbinafine. He continues to have a large superficial open area on the right foot anterior dorsal and slightly between the first and second toes. I did send him to dermatology 2 months ago or so wondering about whether they would do a fungal scraping. I do not believe they did but did do a culture. We have been using silver alginate to the toe areas, he has been using antifungals at home topically either ketoconazole or Lotrisone. We are using silver collagen on the left foot, silver alginate on the right, silver collagen on the left lateral leg and silver  alginate on the left buttock 6/1; left buttock area is healed. We have the left dorsal foot, left lateral leg and right dorsal foot. We are using silver alginate to the areas on both feet and silver collagen to the area on his left lateral calf 6/8; the left buttock apparently reopened late last week. He is not really sure how this happened. He is tolerating the terbinafine. Using silver alginate to all wounds 6/15; left buttock wound is larger than last week but still superficial. Came in the clinic today with a report of purulence from the left lateral leg I did not identify any infection Both areas on his dorsal feet appear to be better. He is tolerating the terbinafine. Using silver alginate to all wounds 6/22; left buttock is about the same this week, left calf quite a bit better. His left foot is about the same however he comes in with erythema and warmth in the right forefoot once again. Culture that I gave him in the beginning of May showed Enterobacter and E. coli. I gave him doxycycline and things seem to improve although neither 1 of these organisms was specifically plated. 6/29; left buttock is larger and  dry this week. Left lateral calf looks to me to be improved. Left dorsal foot also somewhat improved right foot completely unchanged. The erythema on the right foot is still present. He is completing the Ceftin dinner that I gave him empirically [see discussion above.) 7/6 - All wounds look to be stable and perhaps improved, the left buttock wound is slightly smaller, per patient bleeds easily, completed ceftin, the right foot redness is less, he is on terbinafine 7/13; left buttock wound about the same perhaps slightly narrower. Area on the left lateral leg continues to narrow. Left dorsal foot slightly smaller right foot about the same. We are using silver alginate on the right foot and Hydrofera Blue to the areas on the left. Unna boot on the left 2 layer compression on  the right 7/20; left buttock wound absolutely the same. Area on lateral leg continues to get better. Left dorsal foot require debridement as did the right no major change in the 7/27; left buttock wound the same size necrotic debris over the surface. The area on the lateral leg is closed once again. His left foot looks better right foot about the same although there is some involvement now of the posterior first second toe area. He is still on terbinafine which I have given him for a month, not certain a centimeter major change 06/25/19-All wounds appear to be slightly improved according to report, left buttock wound looks clean, both foot wounds have minimal to no debris the right dorsal foot has minimal slough. We are using Hydrofera Blue to the left and silver alginate to the right foot and ischial wound. 8/10-Wounds all appear to be around the same, the right forefoot distal part has some redness which was not there before, however the wound looks clean and small. Ischial wound looks about the same with no changes 8/17; his wound on the left lateral calf which was his original chronic venous insufficiency wound remains closed. Since I last saw him the areas on the left dorsal foot right dorsal foot generally appear better but require debridement. The area on his left initial tuberosity appears somewhat larger to me perhaps hyper granulated and bleeds very easily. We have been using Hydrofera Blue to the left dorsal foot and silver alginate to everything else 8/24; left lateral calf remains closed. The areas on his dorsal feet on the webspace of the first and second toes bilaterally both look better. The area on the left buttock which is the pressure ulcer stage II slightly smaller. I change the dressing to Hydrofera Blue to all areas 8/31; left lateral calf remains closed. The area on his dorsal feet bilaterally look better. Using Hydrofera Blue. Still requiring debridement on the left foot.  No change in the left buttock pressure ulcers however 9/14; left lateral calf remains closed. Dorsal feet look quite a bit better than 2 weeks ago. Flaking dry skin also a lot better with the ammonium lactate I gave him 2 weeks ago. The area on the left buttock is improved. He states that his Roho cushion developed a leak and he is getting a new one, in the interim he is offloading this vigorously 9/21; left calf remains closed. Left heel which was a possible DTI looks better this week. He had macerated tissue around the left dorsal foot right foot looks satisfactory and improved left buttock wound. I changed his dressings to his feet to silver alginate bilaterally. Continuing Hydrofera Blue on the left buttock. 9/28 left calf remains closed. Left  heel did not develop anything [possible DTI] dry flaking skin on the left dorsal foot. Right foot looks satisfactory. Improved left buttock wound. We are using silver alginate on his feet Hydrofera Blue on the buttock. I have asked him to go back to the Lotrisone on his feet including the wounds and surrounding areas 10/5; left calf remains closed. The areas on the left and right feet about the same. A lot of this is epithelialized however debris over the remaining open areas. He is using Lotrisone and silver alginate. The area on the left buttock using Hydrofera Blue 10/26. Patient has been out for 3 weeks secondary to Covid concerns. He tested negative but I think his wife tested positive. He comes in today with the left foot substantially worse, right foot about the same. Even more concerning he states that the area on his left buttock closed over but then reopened and is considerably deeper in one aspect than it was before [stage III wound] 11/2; left foot really about the same as last week. Quarter sized wound on the dorsal foot just proximal to the first second toes. Surrounding erythema with areas of denuded epithelium. This is not really much  different looking. Did not look like cellulitis this time however. Right foot area about the same.. We have been using silver alginate alginate on his toes Left buttock still substantial irritated skin around the wound which I think looks somewhat better. We have been using Hydrofera Blue here. 11/9; left foot larger than last week and a very necrotic surface. Right foot I think is about the same perhaps slightly smaller. Debris around the circumference also addressed. Unfortunately on the left buttock there is been a decline. Satellite lesions below the major wound distally and now a an additional one posteriorly we have been using Hydrofera Blue but I think this is a pressure issue 11/16; left foot ulcer dorsally again a very adherent necrotic surface. Right foot is about the same. Not much change in the pressure ulcer on his left buttock. 11/30; left foot ulcer dorsally basically the same as when I saw him 2 weeks ago. Very adherent fibrinous debris on the wound surface. Patient reports a lot of drainage as well. 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 We have been using Iodoflex, patient changed back to alginate because of the drainage. Area on his right dorsal foot really looks benign with a healthier surface certainly a lot better than on the left. Left buttock wounds all improved using Hydrofera Blue 12/7; left dorsal foot again no improvement. Tightly adherent debris. PCR culture I did last week only showed likely skin contaminant. I have gone ahead and done a punch biopsy of this which is about the last thing in terms of investigations I can think to do. He has known venous insufficiency and venous hypertension and this could be the issue here. The area on the right foot is about the same left buttock slightly worse according to our intake nurse secondary to [facility] Blue sticking to the wound 12/14; biopsy of the left foot that I did last  time showed changes that could be related to wound healing/chronic stasis dermatitis phenomenon no neoplasm. We have been using silver alginate to both feet. I change the one on the left today to Sorbact and silver alginate to his other 2 wounds 12/28; the patient arrives with the following problems; Major issue is the dorsal left foot which continues to be a larger  deeper wound area. Still with a completely nonviable surface Paradoxically the area mirror image on the right on the right dorsal foot appears to be getting better. He had some loss of dry denuded skin from the lower part of his original wound on the left lateral calf. Some of this area looked a little vulnerable and for this reason we put him in wrap that on this side this week The area on his left buttock is larger. He still has the erythematous circular area which I think is a combination of pressure, sweat. This does not look like cellulitis or fungal dermatitis 11/26/2019; -Dorsal left foot large open wound with depth. Still debris over the surface. Using Sorbact The area on the dorsal right foot paradoxically has closed over He has a reopening on the left ankle laterally at the base of his original wound that extended up into the calf. This appears clean. The left buttock wound is smaller but with very adherent necrotic debris over the surface. We have been using silver alginate here as well The patient had arterial studies done in 2017. He had biphasic waveforms at the dorsalis pedis and posterior tibial bilaterally. ABI in the left was 1.17. Digit waveforms were dampened. He has slight spasticity in the great toes I do not think a TBI would be possible 1/11; the patient comes in today with a sizable reopening between the first and second toes on the right. This is not exactly in the same location where we have been treating wounds previously. According to our intake nurse this was actually fairly deep but 0.6 cm. The area on the  left dorsal foot looks about the same the surface is somewhat cleaner using Sorbact, his MRI is in 2 days. We have not managed yet to get arterial studies. The new reopening on the left lateral calf looks somewhat better using alginate. The left buttock wound is about the same using alginate 1/18; the patient had his ARTERIAL studies which were quite normal. ABI in the right at 1.13 with triphasic/biphasic waveforms on the left ABI 1.06 again with triphasic/biphasic waveforms. It would not have been possible to have done a toe brachial index because of spasticity. We have been using Sorbac to the left foot alginate to the rest of his wounds on the right foot left lateral calf and left buttock 1/25; arrives in clinic with erythema and swelling of the left forefoot worse over the first MTP area. This extends laterally dorsally and but also posteriorly. Still has an area on the left lateral part of the lower part of his calf wound it is eschared and clearly not closed. Area on the left buttock still with surrounding irritation and erythema. Right foot surface wound dorsally. The area between the right and first and second toes appears better. 2/1; The left foot wound is about the same. Erythema slightly better I gave him a week of doxycycline empirically Right foot wound is more extensive extending between the toes to the plantar surface Left lateral calf really no open surface on the inferior part of his original wound however the entire area still looks vulnerable Absolutely no improvement in the left buttock wound required debridement. 2/8; the left foot is about the same. Erythema is slightly improved I gave him clindamycin last week. Right foot looks better he is using Lotrimin and silver alginate He has a breakdown in the left lateral calf. Denuded epithelium which I have removed Left buttock about the same were using Hydrofera Blue 2/15;  left foot is about the same there is less surrounding  erythema. Surface still has tightly adherent debris which I have debriding however not making any progress Right foot has a substantial wound on the medial right second toe between the first and second webspace. Still an open area on the left lateral calf distal area. Buttock wound is about the same 2/22; left foot is about the same less surrounding erythema. Surface has adherent debris. Polymen Ag Right foot area significant wound between the first and second toes. We have been using silver alginate here Left lateral leg polymen Ag at the base of his original venous insufficiency wound Left buttock some improvement here 3/1; Right foot is deteriorating in the first second toe webspace. Larger and more substantial. We have been using silver alginate. Left dorsal foot about the same markedly adherent surface debris using PolyMem Ag Left lateral calf surface debris using PolyMem AG Left buttock is improved again using PolyMem Ag. He is completing his terbinafine. The erythema in the foot seems better. He has been on this for 2 weeks 3/8; no improvement in any wound area in fact he has a small open area on the dorsal midfoot which is new this week. He has not gotten his foot x-rays yet 3/15; his x-rays were both negative for osteomyelitis of both feet. No major change in any of his wounds on the extremities however his buttock wounds are better. We have been using polymen on the buttocks, left lower leg. Iodoflex on the left foot and silver alginate on the right 3/22; arrives in clinic today with the 2 major issues are the improvement in the left dorsal foot wound which for once actually looks healthy with a nice healthy wound surface without debridement. Using Iodoflex here. Unfortunately on the left lateral calf which is in the distal part of his original wound he came to the clinic here for there was purulent drainage noted some increased breakdown scattered around the original area and a small  area proximally. We we are using polymen here will change to silver alginate today. His buttock wound on the left is better and I think the area on the right first second toe webspace is also improved 3/29; left dorsal foot looks better. Using Iodoflex. Left ankle culture from deterioration last time grew E. coli, Enterobacter and Enterococcus. I will give him a course of cefdinir although that will not cover Enterococcus. The area on the right foot in the webspace of the first and second toe lateral first toe looks better. The area on his buttock is about healed Vascular appointment is on April 21. This is to look at his venous system vis--vis continued breakdown of the wounds on the left including the left lateral leg and left dorsal foot he. He has had previous ablations on this side 4/5; the area between the right first and second toes lateral aspect of the first toe looks better. Dorsal aspect of the left first toe on the left foot also improved. Unfortunately the left lateral lower leg is larger and there is a second satellite wound superiorly. The usual superficial abrasions on the left buttock overall better but certainly not closed 4/12; the area between the right first and second toes is improved. Dorsal aspect of the left foot also slightly smaller with a vibrant healthy looking surface. No real change in the left lateral leg and the left buttock wound is healed He has an unaffordable co-pay for Apligraf. Appointment with vein and vascular with  regards to the left leg venous part of the circulation is on 4/21 4/19; we continue to see improvement in all wound areas. Although this is minor. He has his vascular appointment on 4/21. The area on the left buttock has not reopened although right in the center of this area the skin looks somewhat threatened 4/26; the left buttock is unfortunately reopened. In general his left dorsal foot has a healthy surface and looks somewhat smaller although it  was not measured as such. The area between his first and second toe webspace on the right as a small wound against the first toe. The patient saw vascular surgery. The real question I was asking was about the small saphenous vein on the left. He has previously ablated left greater saphenous vein. Nothing further was commented on on the left. Right greater saphenous vein without reflux at the saphenofemoral junction or proximal thigh there was no indication for ablation of the right greater saphenous vein duplex was negative for DVT bilaterally. They did not think there was anything from a vascular surgery point of view that could be offered. They ABIs within normal limits 5/3; only small open area on the left buttock. The area on the left lateral leg which was his original venous reflux is now 2 wounds both which look clean. We are using Iodoflex on the left dorsal foot which looks healthy and smaller. He is down to a very tiny area between the right first and second toes, using silver alginate 5/10; all of his wounds appear better. We have much better edema control in 4 layer compression on the left. This may be the factor that is allowing the left foot and left lateral calf to heal. He has external compression garments at home 04/14/20-All of his wounds are progressing well, the left forefoot is practically closed, left ischium appears to be about the same, right toe webspace is also smaller. The left lateral leg is about the same, continue using Hydrofera Blue to this, silver alginate to the ischium, Iodoflex to the toe space on the right 6/7; most of his wounds outside of the left buttock are doing well. The area on the left lateral calf and left dorsal foot are smaller. The area on the right foot in between the first and second toe webspace is barely visible although he still says there is some drainage here is the only reason I did not heal this out. Unfortunately the area on the left buttock  almost looks like he has a skin tear from tape. He has open wound and then a large flap of skin that we are trying to get adherence over an area just next to the remaining wound 6/21; 2 week follow-up. I believe is been here for nurse visits. Miraculously the area between his first and second toes on the left dorsal foot is closed over. Still open on the right first second web space. The left lateral calf has 2 open areas. Distally this is more superficial. The proximal area had a little more depth and required debridement of adherent necrotic material. His buttock wound is actually larger we have been using silver alginate here 6/28; the patient's area on the left foot remains closed. Still open wet area between the first and second toes on the right and also extending into the plantar aspect. We have been using silver alginate in this location. He has 2 areas on the left lower leg part of his original long wounds which I think are better.  We have been using Hydrofera Blue here. Hydrofera Blue to the left buttock which is stable 7/12; left foot remains closed. Left ankle is closed. May be a small area between his right first and second toes the only truly open area is on the left buttock. We have been using Hydrofera Blue here 7/19; patient arrives with marked deterioration especially in the left foot and ankle. We did not put him in a compression wrap on the left last week in fact he wore his juxta lite stockings on either side although he does not have an underlying stocking. He has a reopening on the left dorsal foot, left lateral ankle and a new area on the right dorsal ankle. More worrisome is the degree of erythema on the left foot extending on the lateral foot into the lateral lower leg on the left 7/26; the patient had erythema and drainage from the lateral left ankle last week. Culture of this grew MRSA resistant to doxycycline and clindamycin which are the 2 antibiotics we usually use with  this patient who has multiple antibiotic allergies including linezolid, trimethoprim sulfamethoxazole. I had give him an empiric doxycycline and he comes in the area certainly looks somewhat better although it is blotchy in his lower leg. He has not been systemically unwell. He has had areas on the left dorsal foot which is a reopening, chronic wounds on the left lateral ankle. Both of these I think are secondary to chronic venous insufficiency. The area between his first and second toes is closed as far as I can tell. He had a new wrap injury on the right dorsal ankle last week. Finally he has an area on the left buttock. We have been using silver alginate to everything except the left buttock we are using Hydrofera Blue 06/30/20-Patient returns at 1 week, has been given a sample dose pack of NUZYRA which is a tetracycline derivative [omadacycline], patient has completed those, we have been using silver alginate to almost all the wounds except the left ischium where we are using Hydrofera Blue all of them look better 8/16; since I last saw the patient he has been doing well. The area on the left buttock, left lateral ankle and left foot are all closed today. He has completed the [place] I gave him last time and tolerated this well. He still has open areas on the right dorsal ankle and in the right first second toe area which we are using silver alginate. 8/23; we put him in his bilateral external compression stockings last week as he did not have anything open on either leg except for concerning area between the right first and second toe. He comes in today with an area on the left dorsal foot slightly more proximal than the original wound, the left lateral foot but this is actually a continuation of the area he had on the left lateral ankle from last time. As well he is opened up on the left buttock again. 8/30; comes in today with things looking a lot better. The area on the left lower ankle has closed  down as has the left foot but with eschar in both areas. The area on the dorsal right ankle is also epithelialized. Very little remaining of the left buttock wound. We have been using silver alginate on all wound areas 9/13; the area in the first second toe webspace on the right has fully epithelialized. He still has some vulnerable epithelium on the right and the ankle and the dorsal foot.  He notes weeping. He is using his juxta lite stocking. On the left again the left dorsal foot is closed left lateral ankle is closed. We went to the juxta lite stocking here as well. Still vulnerable in the left buttock although only 2 small open areas remain here 9/27; 2-week follow-up. We did not look at his left leg but the patient says everything is closed. He is a bit disturbed by the amount of edema in his left foot he is using juxta lite stockings but asking about over the toes stockings which would be 30/40, will talk to him next time. According to him there is no open wound on either the left foot or the left ankle/calf He has an open area on the dorsal right calf which I initially point a wrap injury. He has superficial remaining wound on the left ischial tuberosity been using silver alginate although he says this sticks to the wound 10/5; we gave him 2-week follow-up but he called yesterday expressing some concerns about his right foot right ankle and the left buttock. He came in early. There is still no open areas on the left leg and that still in his juxta lite stocking 10/11; he only has 1 small area on the left buttock that remains measuring millimeters 1 mm. Still has the same irritated skin in this area. We recommended zinc oxide when this eventually closes and pressure relief is meticulously is he can do this. He still has an area on the dorsal part of his right first through third toes which is a bit irritated and still open and on the dorsal ankle near the crease of the ankle. We have been using  silver alginate and using his own stocking. He has nothing open on the left leg or foot 10/25; 2-week follow-up. Not nearly as good on the left buttock as I was hoping. For open areas with 5 looking threatened small. He has the erythematous irritated chronic skin in this area. 1 area on the right dorsal ankle. He reports this area bleeds easily Right dorsal foot just proximal to the base of his toes We have been using silver alginate. 11/8; 2-week follow-up. Left buttock is about the same although I do not think the wounds are in the same location we have been using silver alginate. I have asked him to use zinc oxide on the skin around the wounds. He still has a small area on the right dorsal ankle he reports this bleeds easily Right dorsal foot just proximal to the base of the toes does not have anything open although the skin is very dry and scaly He has a new opening on the nailbed of the left great toe. Nothing on the left ankle 11/29; 3-week follow-up. Left buttock has 2 open areas. And washing of these wounds today started bleeding easily. Suggesting very friable tissue. We have been using silver alginate. Right dorsal ankle which I thought was initially a wrap injury we have been using silver alginate. Nothing open between the toes that I can see. He states the area on the left dorsal toe nailbed healed after the last visit in 2 or 3 days 12/13; 3-week follow-up. His left buttock now has 3 open areas but the original 2 areas are smaller using polymen here. Surrounding skin looks better. The right dorsal ankle is closed. He has a small opening on the right dorsal foot at the level of the third toe. In general the skin looks better here. He is wearing  his juxta lite stocking on the left leg says there is nothing open 11/24/2020; 3 weeks follow-up. His left buttock still has the 3 open areas. We have been using polymen but due to lack of response he changed to [facility] area. Surrounding  skin is dry erythematous and irritated looking. There is no evidence of infection either bacterial or fungal however there is loss of surface epithelium He still has very dry skin in his foot causing irritation and erythema on the dorsal part of his toes. This is not responded to prolonged courses of antifungal simply looks dry and irritated 1/24; left buttock area still looks about the same he was unable to find the triad ointment that we had suggested. The area on the right lower leg just above the dorsal ankle has reopened and the areas on the right foot between the first second and second third toes and scaling on the bottom of the foot has been about the same for quite some time now. been using silver alginate to all wound areas 2/7; left buttock wound looked quite good although not much smaller in terms of surface area surrounding skin looks better. Only a few dry flaking areas on the right foot in between the first and second toes the skin generally looks better here [ammonium lactate]. Finally the area on the right dorsal ankle is closed 2/21; There is no open area on the right foot even between the right first and second toe. Skin around this area dorsally and plantar aspects look better. He has a reopening of the area on the right ankle just above the crease of the ankle dorsally. I continue to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 but it is generally better. The oral options that were obvious including Zyvox and sulfonamides he has rash issues both of these. This is sensitive to rifampin but this is not usually used along gentamicin but  this is parenteral and again not used along. The obvious alternative is vancomycin. He has had his arterial studies. [name] T brachial index was 1.3 oe on the right. His waveforms were biphasic bilaterally. Doppler waveforms of the digit were normal in the right damp and on the left. Comment that this could've been due to extreme edema. His venous studies show reflux on both sides in the femoral popliteal veins as well as the greater and lesser saphenous veins bilaterally. Ultimately he is going to need to see vascular surgery about this issue. Hopefully when we can get his wounds and a little better shape. 02/19/16; the patient was able to complete a course of [place]'s for MRSA in the face of multiple antibiotic allergies. Arterial studies showed an ABI of him 0.88 on the right 1.17 on the left the. Waveforms were biphasic at the posterior tibial and dorsalis pedis digital waveforms were normal. Right toe brachial index was 1.3 limited by shaking and edema. His venous study showed widespread reflux in the left at the common femoral vein the greater and lesser saphenous vein the greater and lesser saphenous vein on the right as well as the popliteal and femoral vein. The popliteal and femoral vein on the left did not show reflux. His wounds on the right leg give healed on the  left he is still using Santyl. 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 The erythema has not really resolved and I wonder if this is mostly venous inflammation rather than cellulitis. Still using Santyl. He is approved for Apligraf 03/04/16; there is less erythema around the wound. Both wounds require aggressive surgical debridement. Not yet ready for Apligraf 03/11/16; aggressive debridement again. Not ready for Apligraf 03/18/16 aggressive debridement again. 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 Has been to see vascular surgery he is being planned for a venous ablation 03/25/16; aggressive debridement again of both wound areas on the left lateral leg. He is due for ablation surgery on May 22. He is much closer to being ready for an Apligraf. Has a new area between the left first and second toes 04/01/16 aggressive debridement done of both wounds. The new wound at the base of between his second and first toes looks stable 04/08/16; continued aggressive debridement of both wounds on the left lower leg. He goes for his venous ablation on Monday. The new wound at the base of his first and second toes dorsally appears stable. 04/15/16; wounds aggressively debridement although the base of this looks considerably better Apligraf #1. He had ablation surgery on Monday I'll need to research these records. We only have approval for four Apligraf's 04/22/16; the patient is here for a wound check [Apligraf last week] intake nurse concerned about erythema around the wounds. Apparently a significant degree of drainage. The patient has chronic venous inflammation which I think accounts for most of this however I was asked to look at this today 04/26/16; the patient came back for check of possible cellulitis in his left foot however the Apligraf dressing was inadvertently removed therefore we elected to prep the wound for a second Apligraf. I put him on  doxycycline on 6/1 the erythema in the foot 05/03/16 we did not remove the dressing from the superior wound as this is where I put all of his last Apligraf. Surface debridement done with a curette of the lower wound which looks very healthy.  The area on the left foot also looks quite satisfactory at the dorsal artery at the first and second toes 05/10/16; continue Apligraf to this. Her wound, Hydrafera to the lower wound. He has a new area on the right second toe. Left dorsal foot firstoosecond toe also looks improved 05/24/16; wound dimensions must be smaller I was able to use Apligraf to all 3 remaining wound areas. 06/07/16 patient's last Apligraf was 2 weeks ago. He arrives today with the 2 wounds on his lateral left leg joined together. This would have to be seen as a negative. He also has a small wound in his first and second toe on the left dorsally with quite a bit of surrounding erythema in the first second and third toes. This looks to be infected or inflamed, very difficult clinical call. 06/21/16: lateral left leg combined wounds. Adherent surface slough area on the left dorsal foot at roughly the fourth toe looks improved 07/12/16; he now has a single linear wound on the lateral left leg. This does not look to be a lot changed from when I lost saw this. The area on his dorsal left foot looks considerably better however. 08/02/16; no major change in the substantial area on his left lateral leg since last time. We have been using Hydrofera Blue for a prolonged period of time now. The area on his left foot is also unchanged from last review 07/19/16; the area on his dorsal foot on the left looks considerably smaller. He is beginning to have significant rims of epithelialization on the lateral left leg wound. This also looks better. 08/05/16; the patient came in for a nurse visit today. Apparently the area on his left lateral leg looks better and it was wrapped. However in general discussion the  patient noted a new area on the dorsal aspect of his right second toe. The exact etiology of this is unclear but likely relates to pressure. 08/09/16 really the area on the left lateral leg did not really look that healthy today perhaps slightly larger and measurements. The area on his dorsal right second toe is improved also the left foot wound looks stable to improved 08/16/16; the area on the last lateral leg did not change any of dimensions. Post debridement with a curet the area looked better. Left foot wound improved and the area on the dorsal right second toe is improved 08/23/16; the area on the left lateral leg may be slightly smaller both in terms of length and width. Aggressive debridement with a curette afterwards the tissue appears healthier. Left foot wound appears improved in the area on the dorsal right second toe is improved 08/30/16 patient developed a fever over the weekend and was seen in an urgent care. Felt to have a UTI and put on doxycycline. He has been since changed over the phone to [facility]. After we took off the wrap on his right leg today the leg is swollen warm and erythematous, probably more likely the source of the fever 09/06/16; have been using collagen to the major left leg wound, silver alginate to the area on his anterior foot/toes 09/13/16; the areas on his anterior foot/toes on both sides appear to be virtually closed. Extensive wound on the left lateral leg perhaps slightly narrower but each visit still covered an adherent surface slough 09/16/16 patient was in for his usual Thursday nurse visit however the intake nurse noted significant erythema of his dorsal right foot. He is also running a low- grade fever and having increasing  spasms in the right leg 09/20/16 here for cellulitis involving his right great toes and forefoot. This is a lot better. Still requiring debridement on his left lateral leg. Santyl direct says he needs prior authorization. Therefore his  wife cannot change this at home 09/30/16; the patient's extensive area on the left lateral calf and ankle perhaps somewhat better. Using Santyl. The area on the left toes is healed and I think the area on his right dorsal foot is healed as well. There is no cellulitis or venous inflammation involving the right leg. He is going to need compression stockings here. 10/07/16; the patient's extensive wound on the left lateral calf and ankle does not measure any differently however there appears to be less adherent surface slough using Santyl and aggressive weekly debridements 10/21/16; no major change in the area on the left lateral calf. Still the same measurement still very difficult to debridement adherent slough and nonviable subcutaneous tissue. This is not really been helped by several weeks of Santyl. Previously for 2 weeks I used Iodoflex for a short period. A prolonged course of Hydrofera Blue didn't really help. I'm not sure why I only used 2 weeks of Iodoflex on this there is no evidence of surrounding infection. He has a small area on the right second toe which looks as though it's progressing towards closure 10/28/16; the wounds on his toes appear to be closed. No major change in the left lateral leg wound although the surface looks somewhat better using Iodoflex. He has had previous arterial studies that were normal. He has had reflux studies and is status post ablation although I don't have any exact notes on which vein was ablated. I'll need to check the surgical record 11/04/16; he's had a reopening between the first and second toe on the left and right. No major change in the left lateral leg wound. There is what appears to be cellulitis of the left dorsal foot 11/18/16 the patient was hospitalized initially in [place] and then subsequently transferred to [facility] long and was admitted there from 11/09/16 through 11/12/16. He had developed progressive cellulitis on the right leg in spite of  the doxycycline I gave him. I'd spoken to the hospitalist in [place] who was concerned about continuing leukocytosis. CT scan is what I suggested this was done which showed soft tissue swelling without evidence of osteomyelitis or an underlying abscess blood cultures were negative. At [facility] he was treated with vancomycin and Primaxin and then add an infectious disease consult. He was transitioned to Ceftaroline. He has been making progressive improvement. Overall a severe cellulitis of the right leg. He is been using silver alginate to her original wound on the left leg. The wounds in his toes on the right are closed there is a small open area on the base of the left second toe 11/26/15; the patient's right leg is much better although there is still some edema here this could be reminiscent from his severe cellulitis likely on top of some degree of lymphedema. His left anterior leg wound has less surface slough as reported by her intake nurse. Small wound at the base of the left second toe 12/02/16; patient's right leg is better and there is no open wound here. His left anterior lateral leg wound continues to have a healthy-looking surface. Small wound at the base of the left second toe however there is erythema in the left forefoot which is worrisome 12/16/16; is no open wounds on his right leg. We took  measurements for stockings. His left anterior lateral leg wound continues to have a healthy-looking surface. I'm not sure where we were with the Apligraf run through his insurance. We have been using Iodoflex. He has a thick eschar on the left first second toe interface, I suspect this may be fungal however there is no visible open 12/23/16; no open wound on his right leg. He has 2 small areas left of the linear wound that was remaining last week. We have been using Prisma, I thought I have disclosed this week, we can only look forward to next week 01/03/17; the patient had concerning areas of erythema  last week, already on doxycycline for UTI through his primary doctor. The erythema is absolutely no better there is warmth and swelling both medially from the left lateral leg wound and also the dorsal left foot. 01/06/17- Patient is here for follow-up evaluation of his left lateral leg ulcer and bilateral feet ulcers. He is on oral antibiotic therapy, tolerating that. Nursing staff and the patient states that the erythema is improved from Monday. 01/13/17; the predominant left lateral leg wound continues to be problematic. I had put Apligraf on him earlier this month once. However he subsequently developed what appeared to be an intense cellulitis around the left lateral leg wound. I gave him Dalvance I think on 2/12 perhaps 2/13 he continues on cefdinir. The erythema is still present but the warmth and swelling is improved. I am hopeful that the cellulitis part of this control. I wouldn't be surprised if there is an element of venous inflammation as well. 01/17/17. The erythema is present but better in the left leg. His left lateral leg wound still does not have a viable surface buttons certain parts of this long thin wound it appears like there has been improvement in dimensions. 01/20/17; the erythema still present but much better in the left leg. I'm thinking this is his usual degree of chronic venous inflammation. The wound on the left leg looks somewhat better. Is less surface slough 01/27/17; erythema is back to the chronic venous inflammation. The wound on the left leg is somewhat better. I am back to the point where I like to try an Apligraf once again 02/10/17; slight improvement in wound dimensions. Apligraf #2. He is completing his doxycycline 02/14/17; patient arrives today having completed doxycycline last Thursday. This was supposed to be a nurse visit however once again he hasn't tense erythema from the medial part of his wound extending over the lower leg. Also erythema in his foot this is  roughly in the same distribution as last time. He has baseline chronic venous inflammation however this is a lot worse than the baseline I have learned to accept the on him is baseline inflammation 02/24/17- patient is here for follow-up evaluation. He is tolerating compression therapy. His voicing no complaints or concerns he is here anticipating an Apligraf 03/03/17; he arrives today with an adherent necrotic surface. I don't think this is surface is going to be amenable for Apligraf's. The erythema around his wound and on the left dorsal foot has resolved he is off antibiotics 03/10/17; better-looking surface today. I don't think he can tolerate Apligraf's. He tells me he had a wound VAC after a skin graft years ago to this area and they had difficulty with a seal. The erythema continues to be stable around this some degree of chronic venous inflammation but he also has recurrent cellulitis. We have been using Iodoflex 03/17/17; continued improvement in the surface  and may be small changes in dimensions. Using Iodoflex which seems the only thing that will control his surface 03/24/17- He is here for follow up evaluation of his LLE lateral ulceration and ulcer to right dorsal foot/toe space. He is voicing no complaints or concerns, He is tolerating compression wrap. 03/31/17 arrives today with a much healthier looking wound on the left lower extremity. We have been using Iodoflex for a prolonged period of time which has for the first time prepared and adequate looking wound bed although we have not had much in the way of wound dimension improvement. He also has a small wound between the first and second toe on the right 04/07/17; arrives today with a healthy-looking wound bed and at least the top 50% of this wound appears to be now her. No debridement was required I have changed him to [facility] last week after prolonged Iodoflex. He did not do well with Apligraf's. We've had a re-opening between the  first and second toe on the right 04/14/17; arrives today with a healthier looking wound bed contractions and the top 50% of this wound and some on the lesser 50%. Wound bed appears healthy. The area between the first and second toe on the right still remains problematic 04/21/17; continued very gradual improvement. Using [facility] 04/28/17; continued very gradual improvement in the left lateral leg venous insufficiency wound. His periwound erythema is very mild. We have been using Hydrofera Blue. Wound is making progress especially in the superior 50% 05/05/17; he continues to have very gradual improvement in the left lateral venous insufficiency wound. Both in terms with an length rings are improving. I debrided this every 2 weeks with #5 curet and we have been using Hydrofera Blue and again making good progress With regards to the wounds between his right first and second toe which I thought might of been tinea pedis he is not making as much progress very dry scaly skin over the area. Also the area at the base of the left first and second toe in a similar condition 05/12/17; continued gradual improvement in the refractory left lateral venous insufficiency wound on the left. Dimension smaller. Surface still requiring debridement using Hydrofera Blue 05/19/17; continued gradual improvement in the refractory left lateral venous ulceration. Careful inspection of the wound bed underlying rumination suggested some degree of epithelialization over the surface no debridement indicated. Continue Hydrofera Blue difficult areas between his toes first and third on the left than first and second on the right. I'm going to change to silver alginate from silver collagen. Continue ketoconazole as I suspect underlying tinea pedis 05/26/17; left lateral leg venous insufficiency wound. We've been using Hydrofera Blue. I believe that there is expanding epithelialization over the surface of the wound albeit not coming  from the wound circumference. This is a bit of an odd situation in which the epithelialization seems to be coming from the surface of the wound rather than in the exact circumference. There is still small open areas mostly along the lateral margin of the wound. ooHe has unchanged areas between the left first and second and the right first second toes which I been treating for tenia pedis 06/02/17; left lateral leg venous insufficiency wound. We have been using Hydrofera Blue. Somewhat smaller from the wound circumference. The surface of the wound remains a bit on it almost epithelialized sedation in appearance. I use an open curette today debridement in the surface of all of this especially the edges ooSmall open wounds remaining  on the dorsal right first and second toe interspace and the plantar left first second toe and her face on the left 06/09/17; wound on the left lateral leg continues to be smaller but very gradual and very dry surface using Hydrofera Blue 06/16/17 requires weekly debridements now on the left lateral leg although this continues to contract. I changed to silver collagen last week because of dryness of the wound bed. Using Iodoflex to the areas on his first and second toes/web space bilaterally 06/24/17; patient with history of paraplegia also chronic venous insufficiency with lymphedema. Has a very difficult wound on the left lateral leg. This has been gradually reducing in terms of with but comes in with a very dry adherent surface. High switch to silver collagen a week or so ago with hydrogel to keep the area moist. This is been refractory to multiple dressing attempts. He also has areas in his first and second toes bilaterally in the anterior and posterior web space. I had been using Iodoflex here after a prolonged course of silver alginate with ketoconazole was ineffective [question tinea pedis] 07/14/17; patient arrives today with a very difficult adherent material over his left  lateral lower leg wound. He also has surrounding erythema and poorly controlled edema. He was switched his Santyl last visit which the nurses are applying once during his doctor visit and once on a nurse visit. He was also reduced to 2 layer compression I'm not exactly sure of the issue here. 07/21/17; better surface today after 1 week of Iodoflex. Significant cellulitis that we treated last week also better. [Doxycycline] 07/28/17 better surface today with now 2 weeks of Iodoflex. Significant cellulitis treated with doxycycline. He has now completed the doxycycline and he is back to his usual degree of chronic venous inflammation/stasis dermatitis. He reminds me he has had ablations surgery here 08/04/17; continued improvement with Iodoflex to the left lateral leg wound in terms of the surface of the wound although the dimensions are better. 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 Problematic areas on the plantar aspect of the first second toe web space on the left and the dorsal aspect of the first second toe web space on the right. At one point I felt these were probably related to chronic fungal infections in treated him aggressively for this although we have not made any improvement here. 08/11/17; left lateral leg. Surface continues to improve with the Iodoflex although we are not seeing much improvement in overall wound dimensions. Areas on his plantar left foot and right foot show no improvement. In fact the right foot looks somewhat worse 08/18/17; left lateral leg. We changed to [facility] Blue last week after a prolonged course of Iodoflex which helps get the surface better. It appears that the wound with is improved. Continue with difficult areas on the left dorsal first second and plantar first second on the right 09/01/17; patient arrives in clinic today having had a temperature of 103 yesterday. He was seen in the ER and  [facility]. The patient was concerned he could have cellulitis again in the right leg however they diagnosed him with a UTI and he is now on Keflex. He has a history of cellulitis which is been recurrent and difficult but this is been in the left leg, in the past 5 use doxycycline. He does in and out catheterizations at home which are risk factors for UTI 09/08/17; patient will be completing his Keflex this  weekend. The erythema on the left leg is considerably better. He has a new wound today on the medial part of the right leg small superficial almost looks like a skin tear. He has worsening of the area on the right dorsal first and second toe. His major area on the left lateral leg is better. Using Hydrofera Blue on all areas 09/15/17; gradual reduction in width on the long wound in the left lateral leg. No debridement required. He also has wounds on the plantar aspect of his left first second toe web space and on the dorsal aspect of the right first second toe web space. 09/22/17; there continues to be very gradual improvements in the dimensions of the left lateral leg wound. He hasn't round erythematous spot with might be pressure on his wheelchair. There is no evidence obviously of infection no purulence no warmth ooHe has a dry scaled area on the plantar aspect of the left first second toe ooImproved area on the dorsal right first second toe. 09/29/17; left lateral leg wound continues to improve in dimensions mostly with an is still a fairly long but increasingly narrow wound. ooHe has a dry scaled area on the plantar aspect of his left first second toe web space ooIncreasingly concerning area on the dorsal right first second toe. In fact I am concerned today about possible cellulitis around this wound. The areas extending up his second toe and although there is deformities here almost appears to abut on the nailbed. 10/06/17; left lateral leg wound continues to make very gradual progress.  Tissue culture I did from the right first second toe dorsal foot last time grew MRSA and enterococcus which was vancomycin sensitive. This was not sensitive to clindamycin or doxycycline. He is allergic to Zyvox and sulfa we have therefore arrange for him to have dalvance infusion tomorrow. He is had this in the past and tolerated it well 10/20/17; left lateral leg wound continues to make decent progress. This is certainly reduced in terms of with there is advancing epithelialization.ooThe cellulitis in the right foot looks better although he still has a deep wound in the dorsal aspect of the first second toe web space. Plantar left first toe web space on the left I think is making some progress 10/27/17; left lateral leg wound continues to make decent progress. Advancing epithelialization.using Hydrofera Blue ooThe right first second toe web space wound is better-looking using silver alginate ooImprovement in the left plantar first second toe web space. Again using silver alginate 11/03/17 left lateral leg wound continues to make decent progress albeit slowly. Using Hydrofera Blue ooThe right per second toe web space continues to be a very problematic looking punched out wound. I obtained a piece of tissue for deep culture I did extensively treated this for fungus. It is difficult to imagine that this is a pressure area as the patient states other than going outside he doesn't really wear shoes at home ooThe left plantar first second toe web space looked fairly senescent. Necrotic edges. This required debridement oochange to Hydrofera Blue to all wound areas 11/10/17; left lateral leg wound continues to contract. Using Hydrofera Blue ooOn the right dorsal first second toe web space dorsally. Culture I did of this area last week grew MRSA there is not an easy oral option in this patient was multiple antibiotic allergies or intolerances. This was only a rare culture isolate I'm therefore going  to use Bactroban under silver alginate ooOn the left plantar first second toe web space.  Debridement is required here. This is also unchanged 11/17/17; left lateral leg wound continues to contract using Hydrofera Blue this is no longer the major issue. ooThe major concern here is the right first second toe web space. He now has an open area going from dorsally to the plantar aspect. There is now wound on the inner lateral part of the first toe. Not a very viable surface on this. There is erythema spreading medially into the forefoot. ooNo major change in the left first second toe plantar wound 11/24/17; left lateral leg wound continues to contract using Hydrofera Blue. Nice improvement today ooThe right first second toe web space all of this looks a lot less angry than last week. I have given him clindamycin and topical Bactroban for MRSA and terbinafine for the possibility of underlining tinea pedis that I could not control with ketoconazole. Looks somewhat better ooThe area on the plantar left first second toe web space is weeping with dried debris around the wound 12/01/17; left lateral leg wound continues to contract he Hydrofera Blue. It is becoming thinner in terms of with nevertheless it is making good improvement. ooThe right first second toe web space looks less angry but still a large necrotic-looking wounds starting on the plantar aspect of the right foot extending between the toes and now extensively on the base of the right second toe. I gave him clindamycin and topical Bactroban for MRSA anterior benefiting for the possibility of underlying tinea pedis. Not looking better today ooThe area on the left first/second toe looks better. Debrided of necrotic debris 12/05/17* the patient was worked in urgently today because over the weekend he found blood on his incontinence bad when he woke up. He was found to have an ulcer by his wife who does most of his wound care. He came in today for  [place] to look at this. He has not had a history of wounds in his buttocks in spite of his paraplegia. 12/08/17; seen in follow-up today at his usual appointment. He was seen earlier this week and found to have a new wound on his buttock. We also follow him for wounds on the left lateral leg, left first second toe web space and right first second toe web space 12/15/17; we have been using Hydrofera Blue to the left lateral leg which has improved. The right first second toe web space has also improved. Left first second toe web space plantar aspect looks stable. The left buttock has worsened using Santyl. Apparently the buttock has drainage 12/22/17; we have been using Hydrofera Blue to the left lateral leg which continues to improve now 2 small wounds separated by normal skin. He tells [place] he had a fever up to 100 yesterday he is prone to UTIs but has not noted anything different. He does in and out catheterizations. The area between the first and second toes today does not look good necrotic surface covered with what looks to be purulent drainage and erythema extending into the third toe. I had gotten this to something that I thought look better last time however it is not look good today. He also has a necrotic surface over the buttock wound which is expanded. I thought there might be infection under here so I removed a lot of the surface with a #5 curet though nothing look like it really needed culturing. He is been using Santyl to this area 12/27/17; his original wound on the left lateral leg continues to improve using Hydrofera Blue. I  gave him samples of Baxdella although he was unable to take them out of fear for an allergic reaction ["lump in his throat"].the culture I did of the purulent drainage from his second toe last week showed both enterococcus and a set Enterobacter I was also concerned about the erythema on the bottom of his foot although paradoxically although this looks somewhat better  today. 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 We've been using silver alginate here. They came up today that he sleeps in a recliner, I'm not sure why but I asked him to stop this 01/03/18; his original wound we've been using Hydrofera Blue is now separated into 2 areas. ooUlcer on his left buttock is better he is off the recliner and sleeping in bed ooFinally both wound areas between his first and second toes also looks some better 01/10/18; his original wound on the left lateral leg is now separated into 2 wounds we've been using Hydrofera Blue ooUlcer on his left buttock has some drainage. There is a small probing site going into muscle layer superiorly.using silver alginate -He arrives today with a deep tissue injury on the left heel ooThe wound on the dorsal aspect of his first second toe on the left looks a lot betterusing silver alginate ketoconazole ooThe area on the first second toe web space on the right also looks a lot bette 01/17/18; his original wound on the left lateral leg continues to progress using Hydrofera Blue ooUlcer on his left buttock also is smaller surface healthier except for a small probing site going into the muscle layer superiorly. 2.4 cm of tunneling in this area ooDTI on his left heel we have only been offloading. Looks better than last week no threatened open no evidence of infection oothe wound on the dorsal aspect of the first second toe on the left continues to look like it's regressing we have only been using silver alginate and terbinafine orally ooThe area in the first second toe web space on the right also looks to be a lot better using silver alginate and terbinafine I think this was prompted by tinea pedis 01/31/18; the patient was hospitalized in [place] last week apparently for a complicated UTI. He was discharged on cefepime he does in and  out catheterizations. In the hospital he was discovered M I don't mild elevation of AST and ALT and the terbinafine was stopped.predictably the pressure ulcer on s his buttock looks betterusing silver alginate. The area on the left lateral leg also is better using Hydrofera Blue. The area between the first and second toes on the left better. First and second toes on the right still substantial but better. Finally the DTI on the left heel has held together and looks like it's resolving 02/07/18-he is here in follow-up evaluation for multiple ulcerations. He has new injury to the lateral aspect of the last issue a pressure ulcer, he states this is from adhesive removal trauma. He states he has tried multiple adhesive products with no success. All other ulcers appear stable. The left heel DTI is resolving. We will continue with same treatment plan and follow-up next week. 02/14/18; follow-up for multiple areas. ooHe has a new area last week on the lateral aspect of his pressure ulcer more over the posterior trochanter. The original pressure ulcer looks quite stable has healthy granulation. 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 Almost fully epithelialized on the original superior area using Hydrofera Blue ooDTI on the left heel has peeled off this week to reveal a small superficial wound under denuded skin and subcutaneous tissue ooBoth areas between the first and second toes look better including nothing open on the left 02/21/18; ooThe patient's wounds on his left ischial tuberosity and posterior left greater trochanter actually looked better. He has a large area of irritation around the area which I think is contact dermatitis. I am doubtful that this is fungal ooHis original wound on the left lateral calf continues to improve we have been using Hydrofera Blue ooThere is no open area in  the left first second toe web space although there is a lot of thick callus ooThe DTI on the left heel required debridement today of necrotic surface eschar and subcutaneous tissue using silver alginate ooFinally the area on the right first second toe webspace continues to contract using silver alginate and ketoconazole 02/28/18 ooLeft ischial tuberosity wounds look better using silver alginate. ooOriginal wound on the left calf only has one small open area left using Hydrofera Blue ooDTI on the left heel required debridement mostly removing skin from around this wound surface. Using silver alginate ooThe areas on the right first/second toe web space using silver alginate and ketoconazole 03/08/18 on evaluation today patient appears to be doing decently well as best I can tell in regard to his wounds. This is the first time that I have seen him as he generally is followed by Dr. Dellia Nims. With that being said none of his wounds appear to be infected he does have an area where there is some skin covering what appears to be a new wound on the left dorsal surface of his great toe. This is right at the nail bed. With that being said I do believe that debrided away some of the excess skin can be of benefit in this regard. Otherwise he has been tolerating the dressing changes without complication. 03/14/18; patient arrives today with the multiplicity of wounds that we are following. He has not been systemically unwell ooOriginal wound on the left lateral calf now only has 2 small open areas we've been using Hydrofera Blue which should continue ooThe deep tissue injury on the left heel requires debridement today. We've been using silver alginate ooThe left first second toe and the right first second toe are both are reminiscence what I think was tinea pedis. Apparently some of the callus Surface between the toes was removed last week when it started draining. ooPurulent drainage coming from the wound  on the ischial tuberosity on the left. 03/21/18-He is here in follow-up evaluation for multiple wounds. 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 He tolerated debridement. The only change to last week's recommendations is to discontinue antifungal cream between toes. He will follow-up next week 03/28/18; following up for multiple wounds;Concern this week is streaking redness and swelling in the right foot. He is going to need antibiotics for this. 03/31/18; follow-up for right foot cellulitis. Streaking redness and swelling in the right foot on 03/28/18. 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 I put him on clindamycin 300 mg every 6 and brought him in for a quick check. He has an open wound between his first and second toes on the right foot as a potential source. 04/04/18; ooRight foot cellulitis is resolving he is  completing clindamycin. This is truly good news ooLeft lateral calf wound which is initial wound only has one small open area inferiorly this is close to healing out. He has compression stockings. We will use Hydrofera Blue right down to the epithelialization of this ooNonviable surface on the left heel which was initially pressure with a DTI. We've been using Hydrofera Blue. I'm going to switch this back to silver alginate ooLeft first second toe/tinea pedis this looks better using silver alginate ooRight first second toe tinea pedis using silver alginate ooLarge pressure ulcers on theLeft ischial tuberosity. Small wound here Looks better. I am uncertain about the surface over the large wound. Using silver alginate 04/11/18; ooCellulitis in the right foot is resolved ooLeft lateral calf wound which was his original wounds still has 2 tiny open areas remaining this is just about closed ooNonviable surface on the left heel is better but still requires debridement ooLeft first second  toe/tinea pedis still open using silver alginate ooRight first second toe wound tinea pedis I asked him to go back to using ketoconazole and silver alginate ooLarge pressure ulcers on the left ischial tuberosity this shear injury here is resolved. Wound is smaller. No evidence of infection using silver alginate 04/18/18; ooPatient arrives with an intense area of cellulitis in the right mid lower calf extending into the right heel area. Bright red and warm. Smaller area on the left anterior leg. He has a significant history of MRSA. He will definitely need antibioticsoodoxycycline ooHe now has 2 open areas on the left ischial tuberosity the original large wound and now a satellite area which I think was above his initial satellite areas. Not a wonderful surface on this satellite area surrounding erythema which looks like pressure related. ooHis left lateral calf wound again his original wound is just about closed ooLeft heel pressure injury still requiring debridement ooLeft first second toe looks a lot better using silver alginate ooRight first second toe also using silver alginate and ketoconazole cream also looks better 04/20/18; the patient was worked in early today out of concerns with his cellulitis on the right leg. I had started him on doxycycline. This was 2 days ago. His wife was concerned about the swelling in the area. Also concerned about the left buttock. He has not been systemically unwell no fever chills. No nausea vomiting or diarrhea 04/25/18; the patient's left buttock wound is continued to deteriorate he is using Hydrofera Blue. He is still completing clindamycin for the cellulitis on the right leg although all of this looks better. 05/02/18 ooLeft buttock wound still with a lot of drainage and a very tightly adherent fibrinous necrotic surface. He has a deeper area superiorly ooThe left lateral calf wound is still closed ooDTI wound on the left heel necrotic surface  especially the circumference using Iodoflex ooAreas between his left first second toe and right first second toe both look better. Dorsally and the right first second toe he had a necrotic surface although at smaller. In using silver alginate and ketoconazole. I did a culture last week which was a deep tissue culture of the reminiscence of the open wound on the right first second toe dorsally. This grew a few Acinetobacter and a few methicillin-resistant staph aureus. Nevertheless the area actually this week looked better. I didn't feel the need to specifically address this at least in terms of systemic antibiotics. 05/09/18; wounds are measuring larger more drainage per our intake. We are using Santyl covered with alginate on the large superficial  buttock wounds, Iodosorb on the left heel, ketoconazole and silver alginate to the dorsal first and second toes bilaterally. 05/16/18; ooThe area on his left buttock better in some aspects although the area superiorly over the ischial tuberosity required an extensive debridement.using Santyl ooLeft heel appears stable. Using Iodoflex ooThe areas between his first and second toes are not bad however there is spreading erythema up the dorsal aspect of his left foot this looks like cellulitis again. He is insensate the erythema is really very brilliant.o Erysipelas He went to see an allergist days ago because he was itching part of this he had lab work done. This showed a white count of 15.1 with 70% neutrophils. Hemoglobin of 11.4 and a platelet count of 659,000. Last white count we had in Epic was a 2-1/2 years ago which was 25.9 but he was ill at the time. He was able to show me some lab work that was done by his primary physician the pattern is about the same. 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 But prompted me to go ahead and do x-rays of both feet and the pelvis rule out osteomyelitis. He also had a  comprehensive metabolic panel this was reasonably normal his albumin was 3.7 liver function tests BUN/creatinine all normal 05/23/18; x-rays of both his feet from last week were negative for underlying pulmonary abnormality. The x-ray of his pelvis however showed mild irregularity in the left ischial which may represent some early osteomyelitis. The wound in the left ischial continues to get deeper clearly now exposed muscle. Each week necrotic surface material over this area. Whereas the rest of the wounds do not look so bad. ooThe left ischial wound we have been using Santyl and calcium alginate ooT the left heel surface necrotic debris using Iodoflex o ooThe left lateral leg is still healed ooAreas on the left dorsal foot and the right dorsal foot are about the same. There is some inflammation on the left which might represent contact dermatitis, fungal dermatitis I am doubtful cellulitis although this looks better than last week 05/30/18; CT scan done at Hospital did not show any osteomyelitis or abscess. Suggested the possibility of underlying cellulitis although I don't see a lot of evidence of this at the bedside ooThe wound itself on the left buttock/upper thigh actually looks somewhat better. No debridement ooLeft heel also looks better no debridement continue Iodoflex ooBoth dorsal first second toe spaces appear better using Lotrisone. Left still required debridement 06/06/18; ooIntake reported some purulent looking drainage from the left gluteal wound. Using Santyl and calcium alginate ooLeft heel looks better although still a nonviable surface requiring debridement ooThe left dorsal foot first/second webspace actually expanding and somewhat deeper. I may consider doing a shave biopsy of this area ooRight dorsal foot first/second webspace appears stable to improved. Using Lotrisone and silver alginate to both these areas 06/13/18 ooLeft gluteal surface looks better. Now  separated in the 2 wounds. No debridement required. Still drainage. We'll continue silver alginate ooLeft heel continues to look better with Iodoflex continue this for at least another week ooOf his dorsal foot wounds the area on the left still has some depth although it looks better than last week. We've been using Lotrisone and silver alginate 06/20/18 ooLeft gluteal continues to look better healthy tissue ooLeft heel continues to look better healthy granulation wound is smaller. He is using Iodoflex and his long as this continues continue the Iodoflex ooDorsal right foot  looks better unfortunately dorsal left foot does not. There is swelling and erythema of his forefoot. He had minor trauma to this several days ago but doesn't think this was enough to have caused any tissue injury. Foot looks like cellulitis, we have had this problem before 06/27/18 on evaluation today patient appears to be doing a little worse in regard to his foot ulcer. 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 Both of which would really be his only options for treating this infection. In the past he has been given and effusion of Orbactiv. This is done very well for him in the past again it's one time dosing IV antibiotic therapy. Subsequently I do believe this is something we're gonna need to see about doing at this point in time. Currently his other wounds seem to be doing somewhat better in my pinion I'm pretty happy in that regard. 07/03/18 on evaluation today patient's wounds actually appear to be doing fairly well. He has been tolerating the dressing changes without complication. All in all he seems to be showing signs of improvement. In regard to the antibiotics he has been dealing with infectious disease since I saw him last week as far as getting this scheduled. In the end he's  [facility] this coming Friday. In the meantime he has been continuing to perform the dressing changes in such as previous. There does not appear to be any evidence of infection worsengin at this time. 07/10/18; ooSince I last saw this man 2 weeks ago things have actually improved. IV antibiotics of resulted in less forefoot erythema although there is still some present. He is not systemically unwell ooLeft buttock wounds o2 now have no depth there is increased epithelialization Using silver alginate ooLeft heel still requires debridement using Iodoflex ooLeft dorsal foot still with a sizable wound about the size of a border but healthy granulation ooRight dorsal foot still with a slitlike area using silver alginate 07/18/18; the patient's cellulitis in the left foot is improved in fact I think it is on its way to resolving. ooLeft buttock wounds o2 both look better although the larger one has hypertension granulation we've been using silver alginate ooLeft heel has some thick circumferential redundant skin over the wound edge which will need to be removed today we've been using Iodoflex ooLeft dorsal foot is still a sizable wound required debridement using silver alginate ooThe right dorsal foot is just about closed only a small open area remains here 07/25/18; left foot cellulitis is resolved ooLeft buttock wounds o2 both look better. Hyper-granulation on the major area ooLeft heel as some debris over the surface but otherwise looks a healthier wound. Using silver collagen ooRight dorsal foot is just about closed 07/31/18; arrives with our intake nurse worried about purulent drainage from the buttock. We had hyper-granulation here last week ooHis buttock wounds o2 continue to look better ooLeft heel some debris over the surface but measuring smaller. ooRight dorsal foot unfortunately has openings between the toes ooLeft  foot superficial wound looks less aggravated. 08/07/18 ooButtock wounds continue to look better although some of her granulation and the larger medial wound. silver alginate ooLeft heel continues to look a lot better.silver collagen ooLeft foot superficial wound looks less stable. Requires debridement. He has a new wound superficial area on  the foot on the lateral dorsal foot. ooRight foot looks better using silver alginate without Lotrisone 08/14/2018; patient was in the ER last week diagnosed with a UTI. He is now on Cefpodoxime and Macrodantin. ooButtock wounds continued to be smaller. Using silver alginate ooLeft heel continues to look better using silver collagen ooLeft foot superficial wound looks as though it is improving ooRight dorsal foot area is just about healed. 08/21/2018; patient is completed his antibiotics for his UTI. ooHe has 2 open areas on the buttocks. There is still not closed although the surface looks satisfactory. Using silver alginate ooLeft heel continues to improve using silver collagen ooThe bilateral dorsal foot areas which are at the base of his first and second toes/possible tinea pedis are actually stable on the left but worse on the right. The area on the left required debridement of necrotic surface. After debridement I obtained a specimen for PCR culture. ooThe right dorsal foot which is been just about healed last week is now reopened 08/28/2018; culture done on the left dorsal foot showed coag negative staph both staph epidermidis and Lugdunensis. I think this is worthwhile initiating systemic treatment. I will use doxycycline given his long list of allergies. The area on the left heel slightly improved but still requiring debridement. ooThe large wound on the buttock is just about closed whereas the smaller one is larger. Using silver alginate in this area 09/04/2018; patient is completing his doxycycline for the left foot although this continues  to be a very difficult wound area with very adherent necrotic debris. We are using silver alginate to all his wounds right foot left foot and the small wounds on his buttock, silver collagen on the left heel. 09/11/2018; once again this patient has intense erythema and swelling of the left forefoot. Lesser degrees of erythema in the right foot. He has a long list of allergies and intolerances. I will reinstitute doxycycline. 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 Using silver alginate ooLeft heel also looks better using silver collagen ooUnfortunately both the areas on his feet look worse. The area on the left first second webspace is now gone through to the plantar part of his foot. The area on the left foot anteriorly is irritated with erythema and swelling in the forefoot. 09/25/2018 ooHis wound on the left plantar heel looks better. Using silver collagen ooThe area on the left buttock 2 small remnant areas. One is closed one is still open. Using silver alginate ooThe areas between both his first and second toes look worse. This in spite of long-standing antifungal therapy with ketoconazole and silver alginate which should have antifungal activity ooHe has small areas around his original wound on the left calf one is on the bottom of the original scar tissue and one superiorly both of these are small and superficial but again given wound history in this site this is worrisome 10/02/2018 ooLeft plantar heel continues to gradually contract using silver collagen ooLeft buttock wound is unchanged using silver alginate ooThe areas on his dorsal feet between his first and second toes bilaterally look about the same. 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  Small open area here. We have been using silver alginate 10/09/2018; ooLeft plantar heel continues to look healthy and contract. No debridement is  required ooLeft buttock slightly smaller with a tape injury wound just below which was new this week ooDorsal feet somewhat improved I have been using clindamycin ooLeft lateral looks lower extremity the actual open area looks worse although a lot of this is epithelialized. I am going to change to silver collagen today He has a lot more swelling in the right leg although this is not pitting not red and not particularly warm there is a lot of spasm in the right leg usually indicative of people with paralysis of some underlying discomfort. We have reviewed his vascular status from 2017 he had a left greater saphenous vein ablation. I wonder about referring him back to vascular surgery if the area on the left leg continues to deteriorate. 10/16/2018 in today for follow-up and management of multiple lower extremity ulcers. His left Buttock wound is much lower smaller and almost closed completely. The wound to the left ankle has began to reopen with Epithelialization and some adherent slough. He has multiple new areas to the left foot and leg. The left dorsal foot without much improvement. Wound present between left great webspace and 2nd toe. Erythema and edema present right leg. Right LE ultrasound obtained on 10/10/18 was negative for DVT . 10/23/2018; ooLeft buttock is closed over. Still dry macerated skin but there is no open wound. I suspect this is chronic pressure/moisture ooLeft lateral calf is quite a bit worse than when I saw this last. There is clearly drainage here he has macerated skin into the left plantar heel. We will change the primary dressing to alginate ooLeft dorsal foot has some improvement in overall wound area. Still using clindamycin and silver alginate ooRight dorsal foot about the same as the left using clindamycin and  silver alginate ooThe erythema in the right leg has resolved. He is DVT rule out was negative ooLeft heel pressure area required debridement although the wound is smaller and the surface is health 10/26/2018 ooThe patient came back in for his nurse check today predominantly because of the drainage coming out of the left lateral leg with a recent reopening of his original wound on the left lateral calf. He comes in today with a large amount of surrounding erythema around the wound extending from the calf into the ankle and even in the area on the dorsal foot. He is not systemically unwell. He is not febrile. Nevertheless this looks like cellulitis. We have been using silver alginate to the area. I changed him to a regular visit and I am going to prescribe him doxycycline. The rationale here is a long list of medication intolerances and a history of MRSA. I did not see anything that I thought would provide a valuable culture 10/30/2018 ooFollow-up from his appointment 4 days ago with really an extensive area of cellulitis in the left calf left lateral ankle and left dorsal foot. I put him on doxycycline. He has a long list of medication allergies which are true allergy reactions. 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 In any case he is a lot better today. The erythema especially in the anterior and lateral left calf is better. He still has left ankle erythema. He also is complaining about increasing edema in the right leg we have only been using Kerlix Coban and he has been doing the wraps at home. Finally he has a spotty rash on the medial part of his upper left calf which looks like folliculitis  or perhaps wrap occlusion type injury. Small superficial macules not pustules 11/06/18 patient arrives today with again a considerable degree of erythema around the wound on the left lateral calf extending into the dorsal ankle and  dorsal foot. This is a lot worse than when I saw this last week. He is on doxycycline really with not a lot of improvement. He has not been systemically unwell Wounds on the; left heel actually looks improved. Original area on the left foot and proximity to the first and second toes looks about the same. He has superficial areas on the dorsal foot, anterior calf and then the reopening of his original wound on the left lateral calf which looks about the same ooThe only area he has on the right is the dorsal webspace first and second which is smaller. ooHe has a large area of dry erythematous skin on the left buttock small open area here. 11/13/2018; the patient arrives in much better condition. The erythema around the wound on the left lateral calf is a lot better. Not sure whether this was the clindamycin or the TCA and ketoconazole or just in the improvement in edema control [stasis dermatitis]. In any case this is a lot better. The area on the left heel is very small and just about resolved using silver collagen we have been using silver alginate to the areas on his dorsal feet 11/20/2018; his wounds include the left lateral calf, left heel, dorsal aspects of both feet just proximal to the first second webspace. He is stable to slightly improved. I did not think any changes to his dressings were going to be necessary 11/27/2018 he has a reopening on the left buttock which is surrounded by what looks like tinea or perhaps some other form of dermatitis. The area on the left dorsal foot has some erythema around it I have marked this area but I am not sure whether this is cellulitis or not. Left heel is not closed. Left calf the reopening is really slightly longer and probably worse 1/13; in general things look better and smaller except for the left dorsal foot. Area on the left heel is just about closed, left buttock looks better only a small wound remains in the skin looks better [using  Lotrisone] 1/20; the area on the left heel only has a few remaining open areas here. Left lateral calf about the same in terms of size, left dorsal foot slightly larger right lateral foot still not closed. The area on the left buttock has no open wound and the surrounding skin looks a lot better 1/27; the area on the left heel is closed. Left lateral calf better but still requiring extensive debridements. The area on his left buttock is closed. He still has the open areas on the left dorsal foot which is slightly smaller in the right foot which is slightly expanded. We have been using Iodoflex on these areas as well 2/3; left heel is closed. Left lateral calf still requiring debridement using Iodoflex there is no open area on his left buttock however he has dry scaly skin over a large area of this. Not really responding well to the Lotrisone. Finally the areas on his dorsal feet at the level of the first second webspace are slightly smaller on the right and about the same on the left. Both of these vigorously debrided with Anasept and gauze 2/10; left heel remains closed he has dry erythematous skin over the left buttock but there is no open wound here.  Left lateral leg has come in and with. Still requiring debridement we have been using Iodoflex here. Finally the area on the left dorsal foot and right dorsal foot are really about the same extremely dry callused fissured areas. He does not yet have a dermatology appointment 2/17; left heel remains closed. He has a new open area on the left buttock. The area on the left lateral calf is bigger longer and still covered in necrotic debris. No major change in his foot areas bilaterally. I am awaiting for a dermatologist to look on this. We have been using ketoconazole I do not know that this is been doing any good at all. 2/24; left heel remains closed. The left buttock wound that was new reopening last week looks better. The left lateral calf appears  better also although still requires debridement. The major area on his foot is the left first second also requiring debridement. We have been putting Prisma on all wounds. I do not believe that the ketoconazole has done too much good for his feet. He will use Lotrisone I am going to give him a 2-week course of terbinafine. We still do not have a dermatology appointment 3/2 left heel remains closed however there is skin over bone in this area I pointed this out to him today. The left buttock wound is epithelialized but still does not look completely stable. The area on the left leg required debridement were using silver collagen here. With regards to his feet we changed to Lotrisone last week and silver alginate. 3/9; left heel remains closed. Left buttock remains closed. The area on the right foot is essentially closed. The left foot remains unchanged. Slightly smaller on the left lateral calf. Using silver collagen to both of these areas 3/16-Left heel remains closed. Area on right foot is closed. Left lateral calf above the lateral malleolus open wound requiring debridement with easy bleeding. Left dorsal wound proximal to first toe also debrided. Left ischial area open new. Patient has been using Prisma with wrapping every 3 days. Dermatology appointment is apparently tomorrow.Patient has completed his terbinafine 2-week course with some apparent improvement according to him, there is still flaking and dry skin in his foot on the left 3/23; area on the right foot is reopened. The area on the left anterior foot is about the same still a very necrotic adherent surface. He still has the area on the left leg and reopening is on the left buttock. He apparently saw dermatology although I do not have a note. According to the patient who is usually fairly well informed they did not have any good ideas. Put him on oral terbinafine which she is been on before. 3/30; using silver collagen to all wounds.  Apparently his dermatologist put him on doxycycline and rifampin presumably some culture grew staph. I do not have this result. He remains on terbinafine although I have used terbinafine on him before 4/6; patient has had a fairly substantial reopening on the right foot between the first and second toes. He is finished his terbinafine and I believe is on doxycycline and rifampin still as prescribed by dermatology. We have been using silver collagen to all his wounds although the patient reports that he thinks silver alginate does better on the wounds on his buttock. 4/13; the area on his left lateral calf about the same size but it did not require debridement. ooLeft dorsal foot just proximal to the webspace between the first and second toes is about the same.  Still nonviable surface. I note some superficial bronze discoloration of the dorsal part of his foot ooRight dorsal foot just proximal to the first and second toes also looks about the same. I still think there may be the same discoloration I noted above on the left ooLeft buttock wound looks about the same 4/20; left lateral calf appears to be gradually contracting using silver collagen. 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 The left dorsal foot wound is debrided of tightly adherent necrotic debris and really cleans up quite nicely. The right area is worse with expansion. I did not debride this it is now over the base of the second toe ooThe area on his left buttock is smaller no debridement is required using silver collagen 5/4; left calf continues to make good progress. ooHe arrives with erythema around the wounds on his dorsal foot which even extends to the plantar aspect. Very concerning for coexistent infection. He is finished the erythromycin I gave him for possible erythrasma this does not seem to have helped. ooThe area on the left foot is about the same base of  the dorsal toes ooIs area on the buttock looks improved on the left 5/11; left calf and left buttock continued to make good progress. Left foot is about the same to slightly improved. ooMajor problem is on the right foot. He has not had an x-ray. Deep tissue culture I did last week showed both Enterobacter and E. coli. I did not change the doxycycline I put him on empirically although neither 1 of these were plated to doxycycline. He arrives today with the erythema looking worse on both the dorsal and plantar foot. Macerated skin on the bottom of the foot. he has not been systemically unwell 5/18-Patient returns at 1 week, left calf wound appears to be making some progress, left buttock wound appears slightly worse than last time, left foot wound looks slightly better, right foot redness is marginally better. X-ray of both feet show no air or evidence of osteomyelitis. Patient is finished his Omnicef and terbinafine. He continues to have macerated skin on the bottom of the left foot as well as right 5/26; left calf wound is better, left buttock wound appears to have multiple small superficial open areas with surrounding macerated skin. X-rays that I did last time showed no evidence of osteomyelitis in either foot. He is finished cefdinir and doxycycline. I do not think that he was on terbinafine. He continues to have a large superficial open area on the right foot anterior dorsal and slightly between the first and second toes. I did send him to dermatology 2 months ago or so wondering about whether they would do a fungal scraping. I do not believe they did but did do a culture. We have been using silver alginate to the toe areas, he has been using antifungals at home topically either ketoconazole or Lotrisone. We are using silver collagen on the left foot, silver alginate on the right, silver collagen on the left lateral leg and silver alginate on the left buttock 6/1; left buttock area is healed.  We have the left dorsal foot, left lateral leg and right dorsal foot. We are using silver alginate to the areas on both feet and silver collagen to the area on his left lateral calf 6/8; the left buttock apparently reopened late last week. He is not really sure how this happened. He is tolerating the terbinafine. Using silver alginate to all wounds 6/15; left  buttock wound is larger than last week but still superficial. ooCame in the clinic today with a report of purulence from the left lateral leg I did not identify any infection ooBoth areas on his dorsal feet appear to be better. He is tolerating the terbinafine. Using silver alginate to all wounds 6/22; left buttock is about the same this week, left calf quite a bit better. His left foot is about the same however he comes in with erythema and warmth in the right forefoot once again. Culture that I gave him in the beginning of May showed Enterobacter and E. coli. I gave him doxycycline and things seem to improve although neither 1 of these organisms was specifically plated. 6/29; left buttock is larger and dry this week. Left lateral calf looks to me to be improved. Left dorsal foot also somewhat improved right foot completely unchanged. The erythema on the right foot is still present. He is completing the Ceftin dinner that I gave him empirically [see discussion above.) 7/6 - All wounds look to be stable and perhaps improved, the left buttock wound is slightly smaller, per patient bleeds easily, completed ceftin, the right foot redness is less, he is on terbinafine 7/13; left buttock wound about the same perhaps slightly narrower. Area on the left lateral leg continues to narrow. Left dorsal foot slightly smaller right foot about the same. We are using silver alginate on the right foot and Hydrofera Blue to the areas on the left. Unna boot on the left 2 layer compression on the right 7/20; left buttock wound absolutely the same. Area on  lateral leg continues to get better. Left dorsal foot require debridement as did the right no major change in the 7/27; left buttock wound the same size necrotic debris over the surface. The area on the lateral leg is closed once again. His left foot looks better right foot about the same although there is some involvement now of the posterior first second toe area. He is still on terbinafine which I have given him for a month, not certain a centimeter major change 06/25/19-All wounds appear to be slightly improved according to report, left buttock wound looks clean, both foot wounds have minimal to no debris the right dorsal foot has minimal slough. We are using Hydrofera Blue to the left and silver alginate to the right foot and ischial wound. 8/10-Wounds all appear to be around the same, the right forefoot distal part has some redness which was not there before, however the wound looks clean and small. Ischial wound looks about the same with no changes 8/17; his wound on the left lateral calf which was his original chronic venous insufficiency wound remains closed. Since I last saw him the areas on the left dorsal foot right dorsal foot generally appear better but require debridement. The area on his left initial tuberosity appears somewhat larger to me perhaps hyper granulated and bleeds very easily. We have been using Hydrofera Blue to the left dorsal foot and silver alginate to everything else 8/24; left lateral calf remains closed. The areas on his dorsal feet on the webspace of the first and second toes bilaterally both look better. The area on the left buttock which is the pressure ulcer stage II slightly smaller. I change the dressing to Hydrofera Blue to all areas 8/31; left lateral calf remains closed. The area on his dorsal feet bilaterally look better. Using Hydrofera Blue. Still requiring debridement on the left foot. No change in the left buttock pressure  ulcers however 9/14; left  lateral calf remains closed. Dorsal feet look quite a bit better than 2 weeks ago. Flaking dry skin also a lot better with the ammonium lactate I gave him 2 weeks ago. The area on the left buttock is improved. He states that his Roho cushion developed a leak and he is getting a new one, in the interim he is offloading this vigorously 9/21; left calf remains closed. Left heel which was a possible DTI looks better this week. He had macerated tissue around the left dorsal foot right foot looks satisfactory and improved left buttock wound. I changed his dressings to his feet to silver alginate bilaterally. Continuing Hydrofera Blue on the left buttock. 9/28 left calf remains closed. Left heel did not develop anything [possible DTI] dry flaking skin on the left dorsal foot. Right foot looks satisfactory. Improved left buttock wound. We are using silver alginate on his feet Hydrofera Blue on the buttock. I have asked him to go back to the Lotrisone on his feet including the wounds and surrounding areas 10/5; left calf remains closed. The areas on the left and right feet about the same. A lot of this is epithelialized however debris over the remaining open areas. He is using Lotrisone and silver alginate. The area on the left buttock using Hydrofera Blue 10/26. Patient has been out for 3 weeks secondary to Covid concerns. He tested negative but I think his wife tested positive. He comes in today with the left foot substantially worse, right foot about the same. Even more concerning he states that the area on his left buttock closed over but then reopened and is considerably deeper in one aspect than it was before [stage III wound] 11/2; left foot really about the same as last week. Quarter sized wound on the dorsal foot just proximal to the first second toes. Surrounding erythema with areas of denuded epithelium. This is not really much different looking. Did not look like cellulitis this time  however. ooRight foot area about the same.. We have been using silver alginate alginate on his toes ooLeft buttock still substantial irritated skin around the wound which I think looks somewhat better. We have been using Hydrofera Blue here. 11/9; left foot larger than last week and a very necrotic surface. Right foot I think is about the same perhaps slightly smaller. Debris around the circumference also addressed. Unfortunately on the left buttock there is been a decline. Satellite lesions below the major wound distally and now a an additional one posteriorly we have been using Hydrofera Blue but I think this is a pressure issue 11/16; left foot ulcer dorsally again a very adherent necrotic surface. Right foot is about the same. Not much change in the pressure ulcer on his left buttock. 11/30; left foot ulcer dorsally basically the same as when I saw him 2 weeks ago. Very adherent fibrinous debris on the wound surface. Patient reports a lot of drainage as well. 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 We have been using Iodoflex, patient changed back to alginate because of the drainage. Area on his right dorsal foot really looks benign with a healthier surface certainly a lot better than on the left. Left buttock wounds all improved using Hydrofera Blue 12/7; left dorsal foot again no improvement. Tightly adherent debris. PCR culture I did last week only showed likely skin contaminant. I have gone ahead and done a punch biopsy of this which is about the  last thing in terms of investigations I can think to do. He has known venous insufficiency and venous hypertension and this could be the issue here. The area on the right foot is about the same left buttock slightly worse according to our intake nurse secondary to [facility] Blue sticking to the wound 12/14; biopsy of the left foot that I did last time showed changes that could be related to wound  healing/chronic stasis dermatitis phenomenon no neoplasm. We have been using silver alginate to both feet. I change the one on the left today to Sorbact and silver alginate to his other 2 wounds 12/28; the patient arrives with the following problems; ooMajor issue is the dorsal left foot which continues to be a larger deeper wound area. Still with a completely nonviable surface ooParadoxically the area mirror image on the right on the right dorsal foot appears to be getting better. ooHe had some loss of dry denuded skin from the lower part of his original wound on the left lateral calf. Some of this area looked a little vulnerable and for this reason we put him in wrap that on this side this week ooThe area on his left buttock is larger. He still has the erythematous circular area which I think is a combination of pressure, sweat. This does not look like cellulitis or fungal dermatitis 11/26/2019; -Dorsal left foot large open wound with depth. Still debris over the surface. Using Sorbact ooThe area on the dorsal right foot paradoxically has closed over [facility] has a reopening on the left ankle laterally at the base of his original wound that extended up into the calf. This appears clean. ooThe left buttock wound is smaller but with very adherent necrotic debris over the surface. We have been using silver alginate here as well The patient had arterial studies done in 2017. He had biphasic waveforms at the dorsalis pedis and posterior tibial bilaterally. ABI in the left was 1.17. Digit waveforms were dampened. He has slight spasticity in the great toes I do not think a TBI would be possible 1/11; the patient comes in today with a sizable reopening between the first and second toes on the right. This is not exactly in the same location where we have been treating wounds previously. According to our intake nurse this was actually fairly deep but 0.6 cm. The area on the left dorsal foot looks about the  same the surface is somewhat cleaner using Sorbact, his MRI is in 2 days. We have not managed yet to get arterial studies. The new reopening on the left lateral calf looks somewhat better using alginate. The left buttock wound is about the same using alginate 1/18; the patient had his ARTERIAL studies which were quite normal. ABI in the right at 1.13 with triphasic/biphasic waveforms on the left ABI 1.06 again with triphasic/biphasic waveforms. It would not have been possible to have done a toe brachial index because of spasticity. We have been using Sorbac to the left foot alginate to the rest of his wounds on the right foot left lateral calf and left buttock 1/25; arrives in clinic with erythema and swelling of the left forefoot worse over the first MTP area. This extends laterally dorsally and but also posteriorly. Still has an area on the left lateral part of the lower part of his calf wound it is eschared and clearly not closed. ooArea on the left buttock still with surrounding irritation and erythema. ooRight foot surface wound dorsally. The area between the right  and first and second toes appears better. 2/1; ooThe left foot wound is about the same. Erythema slightly better I gave him a week of doxycycline empirically ooRight foot wound is more extensive extending between the toes to the plantar surface ooLeft lateral calf really no open surface on the inferior part of his original wound however the entire area still looks vulnerable ooAbsolutely no improvement in the left buttock wound required debridement. 2/8; the left foot is about the same. Erythema is slightly improved I gave him clindamycin last week. ooRight foot looks better he is using Lotrimin and silver alginate ooHe has a breakdown in the left lateral calf. Denuded epithelium which I have removed ooLeft buttock about the same were using Hydrofera Blue 2/15; left foot is about the same there is less surrounding  erythema. Surface still has tightly adherent debris which I have debriding however not making any progress ooRight foot has a substantial wound on the medial right second toe between the first and second webspace. ooStill an open area on the left lateral calf distal area. ooButtock wound is about the same 2/22; left foot is about the same less surrounding erythema. Surface has adherent debris. Polymen Ag Right foot area significant wound between the first and second toes. We have been using silver alginate here Left lateral leg polymen Ag at the base of his original venous insufficiency wound ooLeft buttock some improvement here 3/1; ooRight foot is deteriorating in the first second toe webspace. Larger and more substantial. We have been using silver alginate. ooLeft dorsal foot about the same markedly adherent surface debris using PolyMem Ag ooLeft lateral calf surface debris using PolyMem AG ooLeft buttock is improved again using PolyMem Ag. ooHe is completing his terbinafine. The erythema in the foot seems better. He has been on this for 2 weeks 3/8; no improvement in any wound area in fact he has a small open area on the dorsal midfoot which is new this week. He has not gotten his foot x-rays yet 3/15; his x-rays were both negative for osteomyelitis of both feet. No major change in any of his wounds on the extremities however his buttock wounds are better. We have been using polymen on the buttocks, left lower leg. Iodoflex on the left foot and silver alginate on the right 3/22; arrives in clinic today with the 2 major issues are the improvement in the left dorsal foot wound which for once actually looks healthy with a nice healthy wound surface without debridement. Using Iodoflex here. Unfortunately on the left lateral calf which is in the distal part of his original wound he came to the clinic here for there was purulent drainage noted some increased breakdown scattered around the  original area and a small area proximally. We we are using polymen here will change to silver alginate today. His buttock wound on the left is better and I think the area on the right first second toe webspace is also improved 3/29; left dorsal foot looks better. Using Iodoflex. Left ankle culture from deterioration last time grew E. coli, Enterobacter and Enterococcus. I will give him a course of cefdinir although that will not cover Enterococcus. The area on the right foot in the webspace of the first and second toe lateral first toe looks better. The area on his buttock is about healed Vascular appointment is on April 21. This is to look at his venous system vis--vis continued breakdown of the wounds on the left including the left lateral leg and left  dorsal foot he. He has had previous ablations on this side 4/5; the area between the right first and second toes lateral aspect of the first toe looks better. Dorsal aspect of the left first toe on the left foot also improved. Unfortunately the left lateral lower leg is larger and there is a second satellite wound superiorly. The usual superficial abrasions on the left buttock overall better but certainly not closed 4/12; the area between the right first and second toes is improved. Dorsal aspect of the left foot also slightly smaller with a vibrant healthy looking surface. No real change in the left lateral leg and the left buttock wound is healed He has an unaffordable co-pay for Apligraf. Appointment with vein and vascular with regards to the left leg venous part of the circulation is on 4/21 4/19; we continue to see improvement in all wound areas. Although this is minor. He has his vascular appointment on 4/21. The area on the left buttock has not reopened although right in the center of this area the skin looks somewhat threatened 4/26; the left buttock is unfortunately reopened. In general his left dorsal foot has a healthy surface and looks  somewhat smaller although it was not measured as such. The area between his first and second toe webspace on the right as a small wound against the first toe. The patient saw vascular surgery. The real question I was asking was about the small saphenous vein on the left. He has previously ablated left greater saphenous vein. Nothing further was commented on on the left. Right greater saphenous vein without reflux at the saphenofemoral junction or proximal thigh there was no indication for ablation of the right greater saphenous vein duplex was negative for DVT bilaterally. They did not think there was anything from a vascular surgery point of view that could be offered. They ABIs within normal limits 5/3; only small open area on the left buttock. The area on the left lateral leg which was his original venous reflux is now 2 wounds both which look clean. We are using Iodoflex on the left dorsal foot which looks healthy and smaller. He is down to a very tiny area between the right first and second toes, using silver alginate 5/10; all of his wounds appear better. We have much better edema control in 4 layer compression on the left. This may be the factor that is allowing the left foot and left lateral calf to heal. He has external compression garments at home 04/14/20-All of his wounds are progressing well, the left forefoot is practically closed, left ischium appears to be about the same, right toe webspace is also smaller. The left lateral leg is about the same, continue using Hydrofera Blue to this, silver alginate to the ischium, Iodoflex to the toe space on the right 6/7; most of his wounds outside of the left buttock are doing well. The area on the left lateral calf and left dorsal foot are smaller. The area on the right foot in between the first and second toe webspace is barely visible although he still says there is some drainage here is the only reason I did not heal this out. ooUnfortunately  the area on the left buttock almost looks like he has a skin tear from tape. He has open wound and then a large flap of skin that we are trying to get adherence over an area just next to the remaining wound 6/21; 2 week follow-up. I believe is been here for nurse  visits. Miraculously the area between his first and second toes on the left dorsal foot is closed over. Still open on the right first second web space. The left lateral calf has 2 open areas. Distally this is more superficial. The proximal area had a little more depth and required debridement of adherent necrotic material. His buttock wound is actually larger we have been using silver alginate here 6/28; the patient's area on the left foot remains closed. Still open wet area between the first and second toes on the right and also extending into the plantar aspect. We have been using silver alginate in this location. He has 2 areas on the left lower leg part of his original long wounds which I think are better. We have been using Hydrofera Blue here. Hydrofera Blue to the left buttock which is stable 7/12; left foot remains closed. Left ankle is closed. May be a small area between his right first and second toes the only truly open area is on the left buttock. We have been using Hydrofera Blue here 7/19; patient arrives with marked deterioration especially in the left foot and ankle. We did not put him in a compression wrap on the left last week in fact he wore his juxta lite stockings on either side although he does not have an underlying stocking. He has a reopening on the left dorsal foot, left lateral ankle and a new area on the right dorsal ankle. More worrisome is the degree of erythema on the left foot extending on the lateral foot into the lateral lower leg on the left 7/26; the patient had erythema and drainage from the lateral left ankle last week. Culture of this grew MRSA resistant to doxycycline and clindamycin which are the 2  antibiotics we usually use with this patient who has multiple antibiotic allergies including linezolid, trimethoprim sulfamethoxazole. I had give him an empiric doxycycline and he comes in the area certainly looks somewhat better although it is blotchy in his lower leg. He has not been systemically unwell. He has had areas on the left dorsal foot which is a reopening, chronic wounds on the left lateral ankle. Both of these I think are secondary to chronic venous insufficiency. The area between his first and second toes is closed as far as I can tell. He had a new wrap injury on the right dorsal ankle last week. Finally he has an area on the left buttock. We have been using silver alginate to everything except the left buttock we are using Hydrofera Blue 06/30/20-Patient returns at 1 week, has been given a sample dose pack of NUZYRA which is a tetracycline derivative [omadacycline], patient has completed those, we have been using silver alginate to almost all the wounds except the left ischium where we are using Hydrofera Blue all of them look better 8/16; since I last saw the patient he has been doing well. The area on the left buttock, left lateral ankle and left foot are all closed today. He has completed the [place] I gave him last time and tolerated this well. He still has open areas on the right dorsal ankle and in the right first second toe area which we are using silver alginate. 8/23; we put him in his bilateral external compression stockings last week as he did not have anything open on either leg except for concerning area between the right first and second toe. He comes in today with an area on the left dorsal foot slightly more proximal than the  original wound, the left lateral foot but this is actually a continuation of the area he had on the left lateral ankle from last time. As well he is opened up on the left buttock again. 8/30; comes in today with things looking a lot better. The area on  the left lower ankle has closed down as has the left foot but with eschar in both areas. The area on the dorsal right ankle is also epithelialized. Very little remaining of the left buttock wound. We have been using silver alginate on all wound areas 9/13; the area in the first second toe webspace on the right has fully epithelialized. He still has some vulnerable epithelium on the right and the ankle and the dorsal foot. He notes weeping. He is using his juxta lite stocking. On the left again the left dorsal foot is closed left lateral ankle is closed. We went to the juxta lite stocking here as well. ooStill vulnerable in the left buttock although only 2 small open areas remain here 9/27; 2-week follow-up. We did not look at his left leg but the patient says everything is closed. He is a bit disturbed by the amount of edema in his left foot he is using juxta lite stockings but asking about over the toes stockings which would be 30/40, will talk to him next time. According to him there is no open wound on either the left foot or the left ankle/calf He has an open area on the dorsal right calf which I initially point a wrap injury. He has superficial remaining wound on the left ischial tuberosity been using silver alginate although he says this sticks to the wound 10/5; we gave him 2-week follow-up but he called yesterday expressing some concerns about his right foot right ankle and the left buttock. He came in early. There is still no open areas on the left leg and that still in his juxta lite stocking 10/11; he only has 1 small area on the left buttock that remains measuring millimeters 1 mm. Still has the same irritated skin in this area. We recommended zinc oxide when this eventually closes and pressure relief is meticulously is he can do this. He still has an area on the dorsal part of his right first through third toes which is a bit irritated and still open and on the dorsal ankle near the  crease of the ankle. We have been using silver alginate and using his own stocking. He has nothing open on the left leg or foot 10/25; 2-week follow-up. Not nearly as good on the left buttock as I was hoping. For open areas with 5 looking threatened small. He has the erythematous irritated chronic skin in this area. oo1 area on the right dorsal ankle. He reports this area bleeds easily ooRight dorsal foot just proximal to the base of his toes ooWe have been using silver alginate. 11/8; 2-week follow-up. Left buttock is about the same although I do not think the wounds are in the same location we have been using silver alginate. I have asked him to use zinc oxide on the skin around the wounds. ooHe still has a small area on the right dorsal ankle he reports this bleeds easily ooRight dorsal foot just proximal to the base of the toes does not have anything open although the skin is very dry and scaly ooHe has a new opening on the nailbed of the left great toe. Nothing on the left ankle 11/29; 3-week follow-up. Left buttock  has 2 open areas. And washing of these wounds today started bleeding easily. Suggesting very friable tissue. We have been using silver alginate. Right dorsal ankle which I thought was initially a wrap injury we have been using silver alginate. Nothing open between the toes that I can see. He states the area on the left dorsal toe nailbed healed after the last visit in 2 or 3 days 12/13; 3-week follow-up. His left buttock now has 3 open areas but the original 2 areas are smaller using polymen here. Surrounding skin looks better. The right dorsal ankle is closed. He has a small opening on the right dorsal foot at the level of the third toe. In general the skin looks better here. He is wearing his juxta lite stocking on the left leg says there is nothing open 11/24/2020; 3 weeks follow-up. His left buttock still has the 3 open areas. We have been using polymen but due to lack of  response he changed to [facility] area. Surrounding skin is dry erythematous and irritated looking. There is no evidence of infection either bacterial or fungal however there is loss of surface epithelium ooHe still has very dry skin in his foot causing irritation and erythema on the dorsal part of his toes. This is not responded to prolonged courses of antifungal simply looks dry and irritated 1/24; left buttock area still looks about the same he was unable to find the triad ointment that we had suggested. The area on the right lower leg just above the dorsal ankle has reopened and the areas on the right foot between the first second and second third toes and scaling on the bottom of the foot has been about the same for quite some time now. been using silver alginate to all wound areas 2/7; left buttock wound looked quite good although not much smaller in terms of surface area surrounding skin looks better. Only a few dry flaking areas on the right foot in between the first and second toes the skin generally looks better here [ammonium lactate]. Finally the area on the right dorsal ankle is closed 2/21; ooThere is no open area on the right foot even between the right first and second toe. Skin around this area dorsally and plantar aspects look better. ooHe has a reopening of the area on the right ankle just above the crease of the ankle dorsally. I continue to think that this is probably friction from spasms may be even this time with his stocking under the compression stockings. ooWounds on his left buttock look about the same there a couple of areas that have reopened. He has a total square area of loss of epithelialization. This does not look like infection it looks like a contact dermatitis but I just cannot determine to what 3/14; there is nothing on the right foot between the first and second toes this was carefully inspected under illumination. Some chronic irritation on the  dorsal part of his foot from toes 1-3 at the base. Nothing really open here substantially. Still has an area on the right foot/ankle that is actually larger and hyper granulated. His buttock area on the left is just about closed however he has chronic inflammation with loss of the surface epithelial layer 3/28; 2-week follow-up. In clinic today with a new wound on the left anterior mid tibia. Says this happened about 2 weeks ago. He is not really sure how wonders about the spasticity of his legs at night whether that could have caused this  other than that he does not have a good idea. He has been using topical antibiotics and silver alginate. The area on his right dorsal ankle seems somewhat better. ooFinally everything on his left buttock is closed. 4/11; 2-week follow-up. All of his wounds are better except for the area over the ischium and left buttock which have opened up widely again. At least part of this is covered in necrotic fibrinous material another part had rolled nonviable skin. The area on the right ankle, left anterior mid tibia are both a lot better. He had no open wounds on either foot including the areas between the first and second toes 4/25; patient presents for 2-week follow-up. He states that the wounds are overall stable. He has no complaints today and states he is using Hydrofera Blue to open wounds. 5/9; have not seen this man in over a month. For my memory he has open areas on the left mid tibia and right ankle. T oday he has new open area on the right dorsal foot which we have not had a problem with recently. He has the sustained area on the left buttock He is also changed his insurance at the beginning of the year [employer]. We will need prior authorizations for debridement 5/23; patient presents for 2-week follow-up. He has prior authorizations for debridement. He denies any issues in the past 2 weeks with his wound care. He has been using Hydrofera Blue to all the  wounds. He does report a circular rash to the upper left leg that is new. He denies acute signs of infection. 6/6; 2-week follow-up. The patient has open wounds on the left buttock which are worse than the last time I saw this about a month ago. He also has a new area to me on the left anterior mid tibia with some surrounding erythema. The area on the dorsal ankle on the right is closed but I think this will be a friction injury every time this area is exposed to either our wraps or his compression stockings caused by unrelenting spasms in this leg. 6/20; 2-week follow-up. oo The patient has open wounds on the left buttock which is about the same. Using [facility] here. - The left mid tibia has a static amount of surrounding erythema. Also a raised area in the center. We have been using Hydrofera Blue here. ooooFinally he has broken down in his dorsal right foot extending between the first and second toes and going to the base of the first and second toe webspace. I have previously assumed that this was severe venous hypertension 7/5; 2-week follow-up oo The left buttock wound actually looks better. We are using Hydrofera Blue. He has extensive skin irritation around this area and I have not really been able to get that any better. I have tried Lotrisone i.e. antifungals and steroids. More most recently we have just been using Coloplast really looks about the same. oo The left mid tibia which was new last week culture to have very resistant staph aureus. Not only methicillin-resistant but doxycycline resistant. The patient has a plethora of antibiotic allergies including sulfa, linezolid. I used topical bacitracin on this but he has not started this yet. oo In addition he has an expanding area of erythema with a wound on the dorsal right foot. I did a deep tissue culture of this area today 7/12; oo Left buttock area actually looks better surrounding skin also looks less irritated. oo  Left mid tibia looks about the same.  He is using bacitracin this is not worse oo Right dorsal foot looks about the same as well. oo The left first toe also looks about the same 7/19; left buttock wound continues to improve in terms of open areas oo Left mid tibia is still concerning amount of swelling he is using bacitracin oo Dorsal left first toe somewhat smaller oo Right dorsal foot somewhat smaller 7/25; left buttock wound actually continues to improve oo Left mid tibia area has less swelling. I gave him all my samples of new Nuzyra. This seems to have helped although the wound is still open it. His abrasion closed by here oo Left dorsal great toe really no better. Still a very nonviable surface oo Right dorsal foot perhaps some better. oo We have been using bacitracin and silver nitrate to the areas on his lower legs and Hydrofera Blue to the area on the buttock. 8/16 oo Disappointed that his left buttock wound is actually more substantial. Apparently during the last nurse visit these were both very small. He has continued irritation to a large area of skin on his buttock. I have never been able to totally explain this although I think it some combination of the way he sits, pressure, moisture. He is not incontinent enough to contribute to this. oo Left dorsal great toe still fibrinous debris on the surface that I have debrided today oo Large area across the dorsal right toes. oo The area on the left anterior mid tibia has less swelling. He completed the [place]. This does not look infected although the tissue is still fried 8/30; 2-week follow-up. oo Left buttock areas not improved. We used Hydrofera Blue on this. Weeping wet with the surrounding erythema that I have not been able to control even with Lotrisone and topical Coloplast oo Left dorsal great toe looks about the same oo More substantial area again at the base of his toes on the left which is new this week. oo  Area across the dorsal right toes looks improved oo The left anterior mid tibia looks like it is trying to close 9/13; 2-week follow-up. Using silver alginate on all of his wounds. The left dorsal foot does not look any better. He has the area on the dorsal toe and also the areas at the base of all of his toes 1 through 3. On the right foot he has a similar pattern in a similar area. He has the area on his left mid tibia that looks fairly healthy. Finally the area on his left buttock looks somewhat bette 9/20; culture I did of the left foot which was a deep tissue culture last time showed E. coli he has erythema around this wound. Still a completely necrotic surface. His right dorsal foot looks about the same. He has a very friable surface to the left anterior mid tibia. Both buttock wounds look better. We have been using silver alginate to all wounds 10/4; he has completed the cephalexin that I gave him last time for the left foot. He is using topical gentamicin under silver alginate silver alginate being applied to all the wounds. Unfortunately all the wounds look irritated on his dorsal right foot dorsal left foot left mid tibia. I wonder if this could be a silver allergy. I am going to change him to [facility] on the lower extremity. The skin on the left buttock and left posterior thigh still flaking dry and irritated. This has continued no matter what I have applied topically to this. He  has a solitary open wound which by itself does not look too bad however the entire area of surrounding skin does not change no matter what we have applied here 10/18; the area on the left dorsal foot and right dorsal foot both look better. The area on the right extends into the plantar but not between his toes. We have been using silver alginate. He still has a rectangular erythematous area around the area on the left tibia. The wound itself is very small. Finally everything on his left buttock looks a  little larger the skin is erythematous 11/15; patient comes in with the left dorsal and right dorsal foot distally looking somewhat better. Still nonviable surface on the left foot which required debridement. He still has the area on the left anterior mid tibia although this looks somewhat better. He has a new area on the right lateral lower leg just above the ankle. Finally his left buttock looks terrible with multiple superficial open wounds geometric square shaped area of chronic erythema which I have not been able to sort through Objective Constitutional Sitting or standing Blood Pressure is within target range for patient.. Pulse regular and within target range for patient.Marland Kitchen Respirations regular, non-labored and within target range.. Temperature is normal and within the target range for the patient.Marland Kitchen Appears in no distress. Vitals Time Taken: 8:02 AM, Height: 70 in, Weight: 216 lbs, BMI: 31, Temperature: 98.6 F, Pulse: 111 bpm, Respiratory Rate: 16 breaths/min, Blood Pressure: 133/79 mmHg. General Notes: Wound exam; right dorsal foot surface looks better. No debridement. The left dorsal foot has a quarter shape around the wound area nonviable surface which I cleaned up with a #5 curette. Also debridement of the left anterior mid tibia. Hemostasis with silver nitrate. oo The left buttock wound is several small geographic areas all in the same measurement. I am making absolutely no progress here. Surrounding skin is very erythematous Integumentary (Hair, Skin) Wound #41R status is Open. Original cause of wound was Gradually Appeared. The date acquired was: 03/16/2020. The wound has been in treatment 81 weeks. The wound is located on the Left Ischium. The wound measures 7.5cm length x 6.3cm width x 0.1cm depth; 37.11cm^2 area and 3.711cm^3 volume. There is Fat Layer (Subcutaneous Tissue) exposed. There is no tunneling or undermining noted. There is a medium amount of sanguinous drainage noted.  The wound margin is distinct with the outline attached to the wound base. There is large (67-100%) red, friable granulation within the wound bed. There is no necrotic tissue within the wound bed. Wound #51 status is Open. Original cause of wound was Trauma. The date acquired was: 01/26/2021. The wound has been in treatment 33 weeks. The wound is located on the Left,Anterior Lower Leg. The wound measures 1.9cm length x 1cm width x 0.1cm depth; 1.492cm^2 area and 0.149cm^3 volume. There is Fat Layer (Subcutaneous Tissue) exposed. There is no tunneling or undermining noted. There is a medium amount of serosanguineous drainage noted. The wound margin is distinct with the outline attached to the wound base. There is large (67-100%) red, friable, hyper - granulation within the wound bed. There is no necrotic tissue within the wound bed. Wound #52 status is Open. Original cause of wound was Gradually Appeared. The date acquired was: 03/27/2021. The wound has been in treatment 27 weeks. The wound is located on the Right,Dorsal Foot. The wound measures 2.5cm length x 5cm width x 0.1cm depth; 9.817cm^2 area and 0.982cm^3 volume. There is Fat Layer (Subcutaneous Tissue) exposed. There  is no tunneling or undermining noted. There is a medium amount of purulent drainage noted. The wound margin is distinct with the outline attached to the wound base. There is medium (34-66%) pink granulation within the wound bed. There is a medium (34-66%) amount of necrotic tissue within the wound bed including Adherent Slough. Wound #54 status is Converted. Original cause of wound was Pressure Injury. The date acquired was: 05/11/2021. The wound has been in treatment 21 weeks. The wound is located on the Left,Distal Ischium. There is Fat Layer (Subcutaneous Tissue) exposed. There is a medium amount of sanguinous drainage noted. The wound margin is distinct with the outline attached to the wound base. There is large (67-100%) red,  friable granulation within the wound bed. There is no necrotic tissue within the wound bed. Wound #55 status is Open. Original cause of wound was Blister. The date acquired was: 05/26/2021. The wound has been in treatment 19 weeks. The wound is located on the Left T Great. The wound measures 0.4cm length x 0.2cm width x 0.2cm depth; 0.063cm^2 area and 0.013cm^3 volume. There is Fat Layer oe (Subcutaneous Tissue) exposed. There is no tunneling or undermining noted. There is a medium amount of purulent drainage noted. The wound margin is distinct with the outline attached to the wound base. There is large (67-100%) red, pink granulation within the wound bed. There is no necrotic tissue within the wound bed. Wound #56 status is Open. Original cause of wound was Gradually Appeared. The date acquired was: 07/11/2021. The wound has been in treatment 11 weeks. The wound is located on the Left,Dorsal Foot. The wound measures 2.4cm length x 2.6cm width x 0.1cm depth; 4.901cm^2 area and 0.49cm^3 volume. There is Fat Layer (Subcutaneous Tissue) exposed. There is no tunneling or undermining noted. There is a medium amount of purulent drainage noted. The wound margin is distinct with the outline attached to the wound base. There is small (1-33%) pink granulation within the wound bed. There is a large (67-100%) amount of necrotic tissue within the wound bed including Adherent Slough. Wound #57 status is Open. Original cause of wound was Gradually Appeared. The date acquired was: 08/25/2021. The wound has been in treatment 6 weeks. The wound is located on the [place]. The wound measures 1.3cm length x 0.8cm width x 0.1cm depth; 0.817cm^2 area and 0.082cm^3 volume. There is Fat Layer (Subcutaneous Tissue) exposed. There is no tunneling or undermining noted. There is a medium amount of purulent drainage noted. The wound margin is distinct with the outline attached to the wound base. There is medium (34-66%)  red granulation within the wound bed. There is a medium (34- 66%) amount of necrotic tissue within the wound bed including Adherent Slough. Wound #58 status is Open. Original cause of wound was Gradually Appeared. The date acquired was: 09/23/2021. The wound is located on the Right,Lateral Lower Leg. The wound measures 1cm length x 1.4cm width x 0.1cm depth; 1.1cm^2 area and 0.11cm^3 volume. There is Fat Layer (Subcutaneous Tissue) exposed. There is no tunneling or undermining noted. There is a medium amount of serosanguineous drainage noted. The wound margin is distinct with the outline attached to the wound base. There is large (67-100%) red granulation within the wound bed. There is no necrotic tissue within the wound bed. 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 There was a Excisional Skin/Subcutaneous Tissue Debridement with a total area of 1.9 sq cm performed by Ricard Dillon., MD. With the following instrument(s): Curette to remove Non-Viable tissue/material. Material removed includes Subcutaneous Tissue. No specimens were taken. A time out was conducted at 08:37, prior to the start of the procedure. A Moderate amount of bleeding was controlled with Silver Nitrate. The procedure was tolerated well. Post Debridement Measurements: 1.9cm length x 1cm width x 0.1cm depth; 0.149cm^3  volume. Character of Wound/Ulcer Post Debridement is stable. 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 There was a Excisional Skin/Subcutaneous Tissue Debridement with a total area of 6.24 sq cm performed by Ricard Dillon., MD. With the following instrument(s): Curette to remove Non-Viable tissue/material. Material removed includes Subcutaneous Tissue and Slough and. No specimens were taken. A time out was conducted at 08:37, prior to the start of the procedure. A Minimum amount of bleeding was controlled with Pressure. The procedure was tolerated well. Post Debridement Measurements: 2.4cm length x 2.6cm width x 0.1cm depth; 0.49cm^3 volume. Character of Wound/Ulcer Post Debridement is stable. Post procedure Diagnosis Wound #56: Same as Pre-Procedure Plan Follow-up Appointments: Return Appointment in 2 weeks. - with Dr. Dellia Nims ****extra time 75 minutes*** Bathing/ Shower/ Hygiene: May shower and wash wound with soap and water. - on days that dressing is changed Edema Control - Lymphedema / SCD / Other: Elevate legs to the level of the heart or above for 30 minutes daily and/or when sitting, a frequency of: - throughout the day Compression stocking or Garment 30-40 mm/Hg pressure to: - Juxtalite to both legs daily Off-Loading: Roho cushion for wheelchair Turn and reposition every 2 hours WOUND #41R: - Ischium Wound Laterality: Left Cleanser: Soap and Water Every Other Day/30 Days Discharge Instructions: May shower and wash wound with dial antibacterial soap and water prior to dressing change. Peri-Wound Care: Triad Hydrophilic Wound Dressing Tube, 6 (oz) Every Other Day/30 Days Discharge Instructions: Apply to periwound with each dressing change Prim Dressing: Hydrofera Blue Ready Foam, 2.5 x2.5 in Every Other Day/30 Days ary Discharge Instructions:  Apply to wound bed as instructed Secondary Dressing: ABD Pad, 5x9 Every Other Day/30 Days Discharge Instructions: Apply over primary dressing as directed. Secured With: 52M Medipore H Soft Cloth Surgical T 4 x 2 (in/yd) Every Other Day/30 Days ape Discharge Instructions: Secure dressing with tape as directed. WOUND #51: - Lower Leg Wound Laterality: Left, Anterior Cleanser: Soap and Water Every Other Day/30 Days Discharge Instructions: May shower and wash wound with dial antibacterial soap and water prior to dressing change. Prim Dressing: Hydrofera Blue Ready Foam, 4x5 in Every Other Day/30 Days ary Discharge Instructions: Apply to wound bed as instructed Secondary Dressing: Woven Gauze Sponge, Non-Sterile 4x4 in Every Other Day/30 Days Discharge Instructions: Apply over primary dressing as directed. Secondary Dressing: ABD Pad, 5x9 Every Other Day/30 Days Discharge Instructions: Apply over primary dressing as directed. Secured With: [employer], 4.5x3.1 (in/yd) Every Other Day/30 Days Discharge Instructions: Secure with Kerlix as directed.  WOUND #52: - Foot Wound Laterality: Dorsal, Right Cleanser: Soap and Water Every Other Day/30 Days Discharge Instructions: May shower and wash wound with dial antibacterial soap and water prior to dressing change. Topical: Gentamicin Every Other Day/30 Days Discharge Instructions: As directed by physician Prim Dressing: Hydrofera Blue Ready Foam, 2.5 x2.5 in Every Other Day/30 Days ary Discharge Instructions: Apply to wound bed as instructed Secondary Dressing: Woven Gauze Sponge, Non-Sterile 4x4 in Every Other Day/30 Days Discharge Instructions: Apply over primary dressing as directed. Secured With: [employer], 4.5x3.1 (in/yd) Every Other Day/30 Days Discharge Instructions: Secure with Kerlix as directed. Secured With: Transpore Surgical T ape, 2x10 (in/yd) Every Other Day/30 Days Discharge Instructions: Secure dressing with tape  as directed. WOUND #55: - T Great Wound Laterality: Left oe Cleanser: Soap and Water Every Other Day/30 Days Discharge Instructions: May shower and wash wound with dial antibacterial soap and water prior to dressing change. Prim Dressing: Hydrofera Blue Ready Foam, 2.5 x2.5 in Every Other Day/30 Days ary Discharge Instructions: Apply to wound bed as instructed Secondary Dressing: Woven Gauze Sponge, Non-Sterile 4x4 in Every Other Day/30 Days Discharge Instructions: Apply over primary dressing as directed. Secured With: [employer], 4.5x3.1 (in/yd) Every Other Day/30 Days Discharge Instructions: Secure with Kerlix as directed. Secured With: Transpore Surgical T ape, 2x10 (in/yd) Every Other Day/30 Days Discharge Instructions: Secure dressing with tape as directed. WOUND #56: - Foot Wound Laterality: Dorsal, Left Cleanser: Soap and Water Every Other Day/30 Days Discharge Instructions: May shower and wash wound with dial antibacterial soap and water prior to dressing change. Topical: Gentamicin Every Other Day/30 Days Discharge Instructions: Apply directly to wound bed, under alginate Prim Dressing: Hydrofera Blue Classic Foam, 4x4 in Every Other Day/30 Days ary Discharge Instructions: Moisten with saline prior to applying to wound bed Secondary Dressing: Woven Gauze Sponge, Non-Sterile 4x4 in Every Other Day/30 Days Discharge Instructions: Apply over primary dressing as directed. Secured With: [employer], 4.5x3.1 (in/yd) Every Other Day/30 Days Discharge Instructions: Secure with Kerlix as directed. Secured With: Transpore Surgical T ape, 2x10 (in/yd) Every Other Day/30 Days Discharge Instructions: Secure dressing with tape as directed. WOUND #57: - Foot Wound Laterality: Plantar, Right Cleanser: Soap and Water Every Other Day/30 Days Discharge Instructions: May shower and wash wound with dial antibacterial soap and water prior to dressing change. Prim Dressing:  Hydrofera Blue Ready Foam, 2.5 x2.5 in Every Other Day/30 Days ary Discharge Instructions: Apply to wound bed as instructed Secondary Dressing: Woven Gauze Sponge, Non-Sterile 4x4 in Every Other Day/30 Days Discharge Instructions: Apply over primary dressing as directed. Secured With: [employer], 4.5x3.1 (in/yd) Every Other Day/30 Days Discharge Instructions: Secure with Kerlix as directed. Secured With: 26M Medipore H Soft Cloth Surgical T 4 x 2 (in/yd) Every Other Day/30 Days ape Discharge Instructions: Secure dressing with tape as directed. WOUND #58: - Lower Leg Wound Laterality: Right, Lateral Cleanser: Soap and Water Every Other Day/30 Days Discharge Instructions: May shower and wash wound with dial antibacterial soap and water prior to dressing change. Prim Dressing: Hydrofera Blue Ready Foam, 2.5 x2.5 in Every Other Day/30 Days ary Discharge Instructions: Apply to wound bed as instructed Secondary Dressing: Woven Gauze Sponge, Non-Sterile 4x4 in Every Other Day/30 Days Discharge Instructions: Apply over primary dressing as directed. Secured With: [employer], 4.5x3.1 (in/yd) Every Other Day/30 Days Discharge Instructions: Secure with Kerlix as directed. Secured With: Transpore Surgical T ape, 2x10 (in/yd) Every Other Day/30 Days Discharge Instructions: Secure dressing  with tape as directed. I have continued Hydrofera Blue to the bilateral feet. Also the new wound on the right lateral leg and the left anterior mid tibia. Uses a juxta lite stockings. Were not sure how the area on the right lateral lower leg occurred. His edema control looks quite good The left buttock looks terrible. Large area of erythema over the entire surface. There is nothing really definitively diagnostic looking at this. Several geographic wounds that have worsened albeit superficial Electronic Signature(s) Signed: 10/06/2021 4:37:23 PM By: Linton Ham MD Entered By: Linton Ham on  10/06/2021 09:03:21 -------------------------------------------------------------------------------- SuperBill Details Patient Name: Date of Service: [place], [name] 10/06/2021 Medical Record Number: [id number] Patient Account Number: [id number] Date of Birth/Sex: Treating RN: [birth year] (35 y.o. M) Primary Care Provider: [place], [place] Other Clinician: Referring Provider: Treating Provider/Extender: [name]: 300 Diagnosis Coding ICD-10 Codes Code Description I87.332 Chronic venous hypertension (idiopathic) with ulcer and inflammation of left lower extremity L97.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, stage 3 L97.821 Non-pressure chronic ulcer of other part of left lower leg limited to breakdown of skin L97.521 Non-pressure chronic ulcer of other part of left foot limited to breakdown of skin L97.818 Non-pressure chronic ulcer of other part of right lower leg with other 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, complete Facility Procedures CPT4 Code: [id number] Description: 10626 - DEB SUBQ TISSUE 20 SQ CM/< ICD-10 Diagnosis Description L97.521 Non-pressure chronic ulcer of other part of left foot limited to breakdown of s L97.821 Non-pressure chronic ulcer of other part of left lower leg limited to breakdown Modifier: kin of skin Quantity: 1 Physician Procedures : CPT4 Code Description Modifier [id number] 11042 - WC PHYS SUBQ TISS 20 SQ CM ICD-10 Diagnosis Description L97.521 Non-pressure chronic ulcer of other part of left foot limited to breakdown of skin L97.821 Non-pressure chronic ulcer of other part of left  lower leg limited to breakdown of skin Quantity: 1 Electronic Signature(s) Signed: 10/06/2021 4:37:23 PM By: Linton Ham MD Entered By: Linton Ham on 10/06/2021 09:03:53

## 2021-10-20 ENCOUNTER — Other Ambulatory Visit: Payer: Self-pay

## 2021-10-20 ENCOUNTER — Encounter (HOSPITAL_BASED_OUTPATIENT_CLINIC_OR_DEPARTMENT_OTHER): Payer: BC Managed Care – PPO | Admitting: Internal Medicine

## 2021-10-20 DIAGNOSIS — L97522 Non-pressure chronic ulcer of other part of left foot with fat layer exposed: Secondary | ICD-10-CM | POA: Diagnosis not present

## 2021-10-20 DIAGNOSIS — L97812 Non-pressure chronic ulcer of other part of right lower leg with fat layer exposed: Secondary | ICD-10-CM | POA: Diagnosis not present

## 2021-10-20 DIAGNOSIS — L97821 Non-pressure chronic ulcer of other part of left lower leg limited to breakdown of skin: Secondary | ICD-10-CM | POA: Diagnosis not present

## 2021-10-20 DIAGNOSIS — L89523 Pressure ulcer of left ankle, stage 3: Secondary | ICD-10-CM | POA: Diagnosis not present

## 2021-10-20 DIAGNOSIS — L89623 Pressure ulcer of left heel, stage 3: Secondary | ICD-10-CM | POA: Diagnosis not present

## 2021-10-20 DIAGNOSIS — L97511 Non-pressure chronic ulcer of other part of right foot limited to breakdown of skin: Secondary | ICD-10-CM | POA: Diagnosis not present

## 2021-10-20 DIAGNOSIS — L97822 Non-pressure chronic ulcer of other part of left lower leg with fat layer exposed: Secondary | ICD-10-CM | POA: Diagnosis not present

## 2021-10-20 DIAGNOSIS — I10 Essential (primary) hypertension: Secondary | ICD-10-CM | POA: Diagnosis not present

## 2021-10-20 DIAGNOSIS — L97521 Non-pressure chronic ulcer of other part of left foot limited to breakdown of skin: Secondary | ICD-10-CM | POA: Diagnosis not present

## 2021-10-20 DIAGNOSIS — I87332 Chronic venous hypertension (idiopathic) with ulcer and inflammation of left lower extremity: Secondary | ICD-10-CM | POA: Diagnosis not present

## 2021-10-20 DIAGNOSIS — L97512 Non-pressure chronic ulcer of other part of right foot with fat layer exposed: Secondary | ICD-10-CM | POA: Diagnosis not present

## 2021-10-20 DIAGNOSIS — L89302 Pressure ulcer of unspecified buttock, stage 2: Secondary | ICD-10-CM | POA: Diagnosis not present

## 2021-10-20 DIAGNOSIS — G8220 Paraplegia, unspecified: Secondary | ICD-10-CM | POA: Diagnosis not present

## 2021-10-20 DIAGNOSIS — L89323 Pressure ulcer of left buttock, stage 3: Secondary | ICD-10-CM | POA: Diagnosis not present

## 2021-10-20 NOTE — Progress Notes (Signed)
[name], [name] GASKILL ([id number]) Visit Report for 10/20/2021 Arrival Information Details Patient Name: Date of Service: [name], [name] Side. 10/20/2021 9:15 [name] M Medical Record Number: [id number] Patient Account Number: [id number] Date of Birth/Sex: Treating RN: 1986-09-08 (35 y.o. [name] Primary Care Titiana Severa: [place], [place] Other Clinician: Referring Naydeline Morace: Treating Arwen Haseley/Extender: [name]: 10 Visit Information History Since Last Visit Added or deleted any medications: No Patient Arrived: Wheel Chair Any new allergies or adverse reactions: No Arrival Time: 09:27 Had [name] fall or experienced change in No Transfer Assistance: None activities of daily living that may affect Patient Identification Verified: Yes risk of falls: Secondary Verification Process Completed: Yes Signs or symptoms of abuse/neglect since last visito No Patient Requires Transmission-Based Precautions: No Hospitalized since last visit: No Patient Has Alerts: Yes Implantable device outside of the clinic excluding No Patient Alerts: R ABI = 1.0 cellular tissue based products placed in the center L ABI = 1.1 since last visit: Has Dressing in Place as Prescribed: Yes Has Compression in Place as Prescribed: Yes Pain Present Now: No Electronic Signature(s) Signed: 10/20/2021 5:43:30 PM By: Lorrin Jackson Entered By: Lorrin Jackson on 10/20/2021 09:27:53 -------------------------------------------------------------------------------- Clinic Level of Care Assessment Details Patient Name: Date of Service: [name], [name] [name] 10/20/2021 9:15 [name] M Medical Record Number: [id number] Patient Account Number: [id number] Date of Birth/Sex: Treating RN: Nov 28, 1985 (35 y.o. [name] Primary Care Estoria Geary: [name], [name] Other Clinician: Referring Cyerra Yim: Treating Elizjah Noblet/Extender: [name]: [facility] Level of Care Assessment  Items TOOL 4 Quantity Score X- 1 0 Use when only an EandM is performed on FOLLOW-UP visit ASSESSMENTS - Nursing Assessment / Reassessment X- 1 10 Reassessment of Co-morbidities (includes updates in patient status) X- 1 5 Reassessment of Adherence to Treatment Plan ASSESSMENTS - Wound and Skin [name] ssessment / Reassessment []  - 0 Simple Wound Assessment / Reassessment - one wound X- 7 5 Complex Wound Assessment / Reassessment - multiple wounds []  - 0 Dermatologic / Skin Assessment (not related to wound area) ASSESSMENTS - Focused Assessment X- 1 5 Circumferential Edema Measurements - multi extremities []  - 0 Nutritional Assessment / Counseling / Intervention []  - 0 Lower Extremity Assessment (monofilament, tuning fork, pulses) []  - 0 Peripheral Arterial Disease Assessment (using hand held doppler) ASSESSMENTS - Ostomy and/or Continence Assessment and Care []  - 0 Incontinence Assessment and Management []  - 0 Ostomy Care Assessment and Management (repouching, etc.) PROCESS - Coordination of Care []  - 0 Simple Patient / Family Education for ongoing care X- 1 20 Complex (extensive) Patient / Family Education for ongoing care []  - 0 Staff obtains Programmer, systems, Records, T Results / Process Orders est []  - 0 Staff telephones HHA, Nursing Homes / Clarify orders / etc []  - 0 Routine Transfer to another Facility (non-emergent condition) []  - 0 Routine Hospital Admission (non-emergent condition) []  - 0 New Admissions / Biomedical engineer / Ordering NPWT Apligraf, etc. , []  - 0 Emergency Hospital Admission (emergent condition) []  - 0 Simple Discharge Coordination []  - 0 Complex (extensive) Discharge Coordination PROCESS - Special Needs []  - 0 Pediatric / Minor Patient Management []  - 0 Isolation Patient Management []  - 0 Hearing / Language / Visual special needs []  - 0 Assessment of Community assistance (transportation, D/C planning, etc.) []  - 0 Additional assistance  / Altered mentation []  - 0 Support Surface(s) Assessment (bed, cushion, seat, etc.) INTERVENTIONS - Wound Cleansing / Measurement []  - 0 Simple Wound Cleansing -  one wound X- 7 5 Complex Wound Cleansing - multiple wounds X- 1 5 Wound Imaging (photographs - any number of wounds) []  - 0 Wound Tracing (instead of photographs) []  - 0 Simple Wound Measurement - one wound X- 7 5 Complex Wound Measurement - multiple wounds INTERVENTIONS - Wound Dressings []  - 0 Small Wound Dressing one or multiple wounds X- 3 15 Medium Wound Dressing one or multiple wounds []  - 0 Large Wound Dressing one or multiple wounds []  - 0 Application of Medications - topical []  - 0 Application of Medications - injection INTERVENTIONS - Miscellaneous []  - 0 External ear exam []  - 0 Specimen Collection (cultures, biopsies, blood, body fluids, etc.) []  - 0 Specimen(s) / Culture(s) sent or taken to Lab for analysis []  - 0 Patient Transfer (multiple staff / Civil Service fast streamer / Similar devices) []  - 0 Simple Staple / Suture removal (25 or less) []  - 0 Complex Staple / Suture removal (26 or more) []  - 0 Hypo / Hyperglycemic Management (close monitor of Blood Glucose) []  - 0 Ankle / Brachial Index (ABI) - do not check if billed separately X- 1 5 Vital Signs Has the patient been seen at the hospital within the last three years: Yes Total Score: 200 Level Of Care: New/Established - Level 5 Electronic Signature(s) Signed: 10/20/2021 5:43:30 PM By: Lorrin Jackson Entered By: Lorrin Jackson on 10/20/2021 10:27:32 -------------------------------------------------------------------------------- Encounter Discharge Information Details Patient Name: Date of Service: [name], [name] [name] 10/20/2021 9:15 [name] M Medical Record Number: [id number] Patient Account Number: [id number] Date of Birth/Sex: Treating RN: 01-27-1986 (35 y.o. [name] Primary Care Joscelyn Hardrick: [place], [place] Other Clinician: Referring  Seren Chaloux: Treating Bobbyjo Marulanda/Extender: [name]: [phone] Encounter Discharge Information Items Discharge Condition: Stable Ambulatory Status: Wheelchair Discharge Destination: Home Transportation: Private Auto Schedule Follow-up Appointment: Yes Clinical Summary of Care: Provided on 10/20/2021 Form Type Recipient Paper Patient Patient Electronic Signature(s) Signed: 10/20/2021 12:37:06 PM By: Lorrin Jackson Entered By: Lorrin Jackson on 10/20/2021 12:37:06 -------------------------------------------------------------------------------- Lower Extremity Assessment Details Patient Name: Date of Service: [name], [name] [name] 10/20/2021 9:15 [name] M Medical Record Number: [id number] Patient Account Number: [id number] Date of Birth/Sex: Treating RN: 1986/09/20 (35 y.o. [name] Primary Care Kiarrah Rausch: [place], [place] Other Clinician: Referring Caeleb Batalla: Treating Quandre Polinski/Extender: Dutch Gray, [name]: 302 Edema Assessment Assessed: [Left: Yes] [Right: Yes] Edema: [Left: Yes] [Right: Yes] Calf Left: Right: Point of Measurement: 33 cm From Medial Instep 29 cm 32 cm Ankle Left: Right: Point of Measurement: 10 cm From Medial Instep 25 cm 25 cm Vascular Assessment Pulses: Dorsalis Pedis Palpable: [Left:Yes] [Right:Yes] Electronic Signature(s) Signed: 10/20/2021 10:09:28 AM By: Lorrin Jackson Entered By: Lorrin Jackson on 10/20/2021 10:09:28 -------------------------------------------------------------------------------- Multi Wound Chart Details Patient Name: Date of Service: [name], [name] [name] 10/20/2021 9:15 [name] M Medical Record Number: [id number] Patient Account Number: [id number] Date of Birth/Sex: Treating RN: 13-Jan-1986 (35 y.o. M) Primary Care Lataria Courser: [name], [name] Other Clinician: Referring Dakotah Heiman: Treating Marayah Higdon/Extender: Dutch Gray, [name]: 302 Vital Signs Height(in):  70 Pulse(bpm): 128 Weight(lbs): 216 Blood Pressure(mmHg): 134/79 Body Mass Index(BMI): 31 Temperature(F): 98.3 Respiratory Rate(breaths/min): 18 Photos: Left Ischium Left, Anterior Lower Leg Right, Dorsal Foot Wound Location: Gradually Appeared Trauma Gradually Appeared Wounding Event: Pressure Ulcer Trauma, Other Trauma, Other Primary Etiology: Sleep Apnea, Hypertension, Paraplegia Sleep Apnea, Hypertension, Paraplegia Sleep Apnea, Hypertension, Paraplegia Comorbid History: 03/16/2020 01/26/2021 03/27/2021 Date Acquired: 83 35 29 Weeks of Treatment: Open Open Open Wound Status: Yes No  No Wound Recurrence: Yes No No Clustered Wound: 2 [name] [name] Clustered Quantity: 7x4x0.1 1.7x0.7x0.1 2.4x4.5x0.1 Measurements L x W x D (cm) 21.991 0.935 8.482 [name] (cm) : rea 2.199 0.093 0.848 Volume (cm) : -1117.70% -183.30% -350.00% % Reduction in [name] rea: -1114.90% -181.80% -351.10% % Reduction in Volume: Category/Stage II Full Thickness Without Exposed Full Thickness Without Exposed Classification: Support Structures Support Structures Medium Medium Medium Exudate Amount: Sanguinous Serosanguineous Purulent Exudate Type: red red, brown yellow, brown, green Exudate Color: Distinct, outline attached Distinct, outline attached Distinct, outline attached Wound Margin: Large (67-100%) Large (67-100%) Large (67-100%) Granulation Amount: Red, Friable Red, Hyper-granulation Red Granulation Quality: None Present (0%) None Present (0%) Small (1-33%) Necrotic Amount: Fat Layer (Subcutaneous Tissue): Yes Fat Layer (Subcutaneous Tissue): Yes Fat Layer (Subcutaneous Tissue): Yes Exposed Structures: Fascia: No Fascia: No Fascia: No Tendon: No Tendon: No Tendon: No Muscle: No Muscle: No Muscle: No Joint: No Joint: No Joint: No Bone: No Bone: No Bone: No None Small (1-33%) None Epithelialization: Periwound irritation [name] [name] Assessment Notes: Wound Number: [id number] Photos: Left  T Great oe Left, Dorsal Foot Right, Plantar Foot Wound Location: Blister Gradually Appeared Gradually Appeared Wounding Event: 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 Primary Etiology: Sleep Apnea, Hypertension, Paraplegia Sleep Apnea, Hypertension, Paraplegia Sleep Apnea, Hypertension, Paraplegia Comorbid History: 05/26/2021 07/11/2021 08/25/2021 Date Acquired: 21 13 8  Weeks of Treatment: Open Open Open Wound Status: No No No Wound Recurrence: No No No Clustered Wound: [name] [name] [name] Clustered Quantity: 0.3x0.2x0.2 2.2x2.2x0.3 2x2x0.1 Measurements L x W x D (cm) 0.047 3.801 3.142 [name] (cm) : rea 0.009 1.14 0.314 Volume (cm) : 94.00% 38.90% -207.70% % Reduction in [name] rea: 88.60% -83.30% -207.80% % Reduction in Volume: Full Thickness With Exposed Support Full Thickness Without Exposed Full Thickness Without Exposed Classification: Structures Support Structures Support Structures Medium Medium Medium Exudate Amount: Serosanguineous Purulent Purulent Exudate Type: red, brown yellow, brown, green yellow, brown, green Exudate Color: Distinct, outline attached Distinct, outline attached Distinct, outline attached Wound Margin: Large (67-100%) Large (67-100%) Medium (34-66%) Granulation Amount: Red, Friable Red Red Granulation Quality: None Present (0%) Small (1-33%) Medium (34-66%) Necrotic Amount: Fat Layer (Subcutaneous Tissue): Yes Fat Layer (Subcutaneous Tissue): Yes Fat Layer (Subcutaneous Tissue): Yes Exposed Structures: Fascia: No Fascia: No Fascia: No Tendon: No Tendon: No Tendon: No Muscle: No Muscle: No Muscle: No Joint: No Joint: No Joint: No Bone: No Bone: No Bone: No Medium (34-66%) Small (1-33%) Small (1-33%) Epithelialization: [name] [name] [name] Assessment Notes: Wound Number: 10 [name] [name] Photos: [name] [name] Right, Lateral Lower Leg [name] [name] Wound Location: Gradually Appeared [name] [name] Wounding  Event: Trauma, Other [name] [name] Primary Etiology: Sleep Apnea, Hypertension, Paraplegia [name] [name] Comorbid History: 09/23/2021 [name] [name] Date Acquired: 2 [name] [name] Weeks of Treatment: Open [name] [name] Wound Status: No [name] [name] Wound Recurrence: No [name] [name] Clustered Wound: [name] [name] [name] Clustered Quantity: 0.5x0.8x0.1 [name] [name] Measurements L x W x D (cm) 0.314 [name] [name] [name] (cm) : rea 0.031 [name] [name] Volume (cm) : 71.50% [name] [name] % Reduction in [name] rea: 71.80% [name] [name] % Reduction in Volume: Full Thickness Without Exposed [name] [name] Classification: Support Structures Medium [name] [name] Exudate Amount: Serosanguineous [name] [name] Exudate Type: red, brown [name] [name] Exudate Color: Distinct, outline attached [name] [name] Wound Margin: Large (67-100%) [name] [name] Granulation Amount: Red, Friable [name] [name] Granulation Quality: None Present (0%) [name] [name] Necrotic Amount: Fat Layer (Subcutaneous Tissue): Yes [name] [name] Exposed Structures: Fascia: No Tendon: No  Muscle: No Joint: No Bone: No Medium (34-66%) [name] [name] Epithelialization: Periwound erythema [name] [name] Assessment Notes: Treatment Notes Electronic Signature(s) Signed: 10/20/2021 5:06:47 PM By: Linton Ham MD Entered By: Linton Ham on 10/20/2021 10:44:02 -------------------------------------------------------------------------------- Multi-Disciplinary Care Plan Details Patient Name: Date of Service: Kerekes, [name] [name] 10/20/2021 9:15 [name] M Medical Record Number: [id number] Patient Account Number: [id number] Date of Birth/Sex: Treating RN: 07-Nov-1986 (35 y.o. [name] Primary Care Shamari Lofquist: [name], [name] Other Clinician: Referring Kamori Kitchens: Treating Eyana Stolze/Extender: [name]: [place] reviewed with physician Active Inactive Wound/Skin Impairment Nursing Diagnoses: Impaired tissue integrity Knowledge deficit related to ulceration/compromised skin  integrity Goals: Patient/caregiver will verbalize understanding of skin care regimen Date Initiated: 01/05/2016 Target Resolution Date: 10/23/2021 Goal Status: Active Ulcer/skin breakdown will have [name] volume reduction of 30% by week 4 Date Initiated: 01/05/2016 Date Inactivated: 12/22/2017 Target Resolution Date: 01/19/2018 Unmet Reason: complex wounds, Goal Status: Unmet infection Interventions: Assess patient/caregiver ability to obtain necessary supplies Assess ulceration(s) every visit Provide education on ulcer and skin care Notes: 02/02/21: Complex Care, ongoing. Electronic Signature(s) Signed: 10/20/2021 5:43:30 PM By: Lorrin Jackson Entered By: Lorrin Jackson on 10/20/2021 09:26:57 -------------------------------------------------------------------------------- Pain Assessment Details Patient Name: Date of Service: Brechtel, [name] [name] 10/20/2021 9:15 [name] M Medical Record Number: [id number] Patient Account Number: [id number] Date of Birth/Sex: Treating RN: [birth year] (35 y.o. [name] Primary Care Kavaughn Faucett: [place], [place] Other Clinician: Referring Analysa Nutting: Treating Pinky Ravan/Extender: [name]: 302 Active Problems Location of Pain Severity and Description of Pain Patient Has Paino No Site Locations Pain Management and Medication Current Pain Management: Electronic Signature(s) Signed: 10/20/2021 5:43:30 PM By: Lorrin Jackson Entered By: Lorrin Jackson on 10/20/2021 09:37:22 -------------------------------------------------------------------------------- Patient/Caregiver Education Details Patient Name: Date of Service: [place], [name] [name] 11/29/2022andnbsp9:15 [name] M Medical Record Number: [id number] Patient Account Number: [id number] Date of Birth/Gender: Treating RN: Aug 02, 1986 (35 y.o. [name] Primary Care Physician: Janine Limbo Other Clinician: Referring Physician: Treating Physician/Extender: [name]: [place] Education Assessment Education Provided To: Patient Education Topics Provided Offloading: Methods: Explain/Verbal, Printed Responses: State content correctly Pressure: Methods: Explain/Verbal, Printed Responses: State content correctly Wound/Skin Impairment: Methods: Explain/Verbal, Printed Responses: State content correctly Electronic Signature(s) Signed: 10/20/2021 5:43:30 PM By: Lorrin Jackson Entered By: Lorrin Jackson on 10/20/2021 09:27:26 -------------------------------------------------------------------------------- Wound Assessment Details Patient Name: Date of Service: Hemric, [name] [name] 10/20/2021 9:15 [name] M Medical Record Number: [id number] Patient Account Number: [id number] Date of Birth/Sex: Treating RN: [birth year] (35 y.o. [name] Primary Care Korie Brabson: [name], [name] Other Clinician: Referring Cresencio Reesor: Treating Tali Coster/Extender: [name]: 302 Wound Status Wound Number: [id number] Primary Etiology: Pressure Ulcer Wound Location: Left Ischium Wound Status: Open Wounding Event: Gradually Appeared Comorbid History: Sleep Apnea, Hypertension, Paraplegia Date Acquired: 03/16/2020 Weeks Of Treatment: 83 Clustered Wound: Yes Photos Wound Measurements Length: (cm) 7 Width: (cm) 4 Depth: (cm) 0.1 Clustered Quantity: 2 Area: (cm) 21.991 Volume: (cm) 2.199 % Reduction in Area: -1117.7% % Reduction in Volume: -1114.9% Epithelialization: None Tunneling: No Undermining: No Wound Description Classification: Category/Stage II Wound Margin: Distinct, outline attached Exudate Amount: Medium Exudate Type: Sanguinous Exudate Color: red Foul Odor After Cleansing: No Slough/Fibrino No Wound Bed Granulation Amount: Large (67-100%) Exposed Structure Granulation Quality: Red, Friable Fascia Exposed: No Necrotic Amount: None Present (0%) Fat Layer (Subcutaneous Tissue) Exposed:  Yes Tendon Exposed: No Muscle Exposed: No Joint Exposed: No Bone Exposed: No Assessment Notes Periwound irritation  Treatment Notes Wound #41R (Ischium) Wound Laterality: Left Cleanser Soap and Water Discharge Instruction: May shower and wash wound with dial antibacterial soap and water prior to dressing change. Peri-Wound Care Triad Hydrophilic Wound Dressing Tube, 6 (oz) Discharge Instruction: Apply to periwound with each dressing change Topical Primary Dressing Hydrofera Blue Ready Foam, 2.5 x2.5 in Discharge Instruction: Apply to wound bed as instructed Secondary Dressing ABD Pad, 5x9 Discharge Instruction: Apply over primary dressing as directed. Secured With 52M Medipore H Soft Cloth Surgical T 4 x 2 (in/yd) ape Discharge Instruction: Secure dressing with tape as directed. Compression Wrap Compression Stockings Add-Ons Electronic Signature(s) Signed: 10/20/2021 5:43:30 PM By: Lorrin Jackson Entered By: Lorrin Jackson on 10/20/2021 10:04:55 -------------------------------------------------------------------------------- Wound Assessment Details Patient Name: Date of Service: [name], [name] [name] 10/20/2021 9:15 [name] M Medical Record Number: [id number] Patient Account Number: [id number] Date of Birth/Sex: Treating RN: 21-Apr-1986 (35 y.o. [name] Primary Care Ingris Pasquarella: [name], [name] Other Clinician: Referring Yandell Mcjunkins: Treating Daana Petrasek/Extender: [name]: 302 Wound Status Wound Number: 51 Primary Etiology: Trauma, Other Wound Location: Left, Anterior Lower Leg Wound Status: Open Wounding Event: Trauma Comorbid History: Sleep Apnea, Hypertension, Paraplegia Date Acquired: 01/26/2021 Weeks Of Treatment: 35 Clustered Wound: No Photos Wound Measurements Length: (cm) 1.7 Width: (cm) 0.7 Depth: (cm) 0.1 Area: (cm) 0.935 Volume: (cm) 0.093 Wound Description Classification: Full Thickness Without Exposed Support  Structures Wound Margin: Distinct, outline attached Exudate Amount: Medium Exudate Type: Serosanguineous Exudate Color: red, brown Foul Odor After Cleansing: Slough/Fibrino % Reduction in Area: -183.3% % Reduction in Volume: -181.8% Epithelialization: Small (1-33%) Tunneling: No Undermining: No No No Wound Bed Granulation Amount: Large (67-100%) Exposed Structure Granulation Quality: Red, Hyper-granulation Fascia Exposed: No Necrotic Amount: None Present (0%) Fat Layer (Subcutaneous Tissue) Exposed: Yes Tendon Exposed: No Muscle Exposed: No Joint Exposed: No Bone Exposed: No Treatment Notes Wound #51 (Lower Leg) Wound Laterality: Left, Anterior Cleanser Soap and Water Discharge Instruction: May shower and wash wound with dial antibacterial soap and water prior to dressing change. Peri-Wound Care Topical Primary Dressing Hydrofera Blue Ready Foam, 4x5 in Discharge Instruction: Apply to wound bed as instructed Secondary Dressing Woven Gauze Sponge, Non-Sterile 4x4 in Discharge Instruction: Apply over primary dressing as directed. ABD Pad, 5x9 Discharge Instruction: Apply over primary dressing as directed. Secured With [employer], 4.5x3.1 (in/yd) Discharge Instruction: Secure with Kerlix as directed. Compression Wrap Compression Stockings Add-Ons Electronic Signature(s) Signed: 10/20/2021 5:43:30 PM By: Lorrin Jackson Entered By: Lorrin Jackson on 10/20/2021 09:50:50 -------------------------------------------------------------------------------- Wound Assessment Details Patient Name: Date of Service: [name], [name] [name] 10/20/2021 9:15 [name] M Medical Record Number: [id number] Patient Account Number: [id number] Date of Birth/Sex: Treating RN: Oct 13, 1986 (35 y.o. [name] Primary Care Kristell Wooding: [name], [name] Other Clinician: Referring Victorina Kable: Treating Odaly Peri/Extender: [name]: 302 Wound Status Wound  Number: 52 Primary Etiology: Trauma, Other Wound Location: Right, Dorsal Foot Wound Status: Open Wounding Event: Gradually Appeared Comorbid History: Sleep Apnea, Hypertension, Paraplegia Date Acquired: 03/27/2021 Weeks Of Treatment: 29 Clustered Wound: No Photos Wound Measurements Length: (cm) 2.4 Width: (cm) 4.5 Depth: (cm) 0.1 Area: (cm) 8.482 Volume: (cm) 0.848 % Reduction in Area: -350% % Reduction in Volume: -351.1% Epithelialization: None Tunneling: No Undermining: No Wound Description Classification: Full Thickness Without Exposed Support Structures Wound Margin: Distinct, outline attached Exudate Amount: Medium Exudate Type: Purulent Exudate Color: yellow, brown, green Foul Odor After Cleansing: No Slough/Fibrino Yes Wound Bed Granulation Amount: Large (67-100%) Exposed Structure Granulation Quality: Red  Fascia Exposed: No Necrotic Amount: Small (1-33%) Fat Layer (Subcutaneous Tissue) Exposed: Yes Necrotic Quality: Adherent Slough Tendon Exposed: No Muscle Exposed: No Joint Exposed: No Bone Exposed: No Treatment Notes Wound #52 (Foot) Wound Laterality: Dorsal, Right Cleanser Soap and Water Discharge Instruction: May shower and wash wound with dial antibacterial soap and water prior to dressing change. Peri-Wound Care Topical Gentamicin Discharge Instruction: As directed by physician Primary Dressing Hydrofera Blue Ready Foam, 2.5 x2.5 in Discharge Instruction: Apply to wound bed as instructed Secondary Dressing Woven Gauze Sponge, Non-Sterile 4x4 in Discharge Instruction: Apply over primary dressing as directed. Secured With [employer], 4.5x3.1 (in/yd) Discharge Instruction: Secure with Kerlix as directed. Transpore Surgical Tape, 2x10 (in/yd) Discharge Instruction: Secure dressing with tape as directed. Compression Wrap Compression Stockings Add-Ons Electronic Signature(s) Signed: 10/20/2021 5:43:30 PM By: Lorrin Jackson Entered By:  Lorrin Jackson on 10/20/2021 09:58:32 -------------------------------------------------------------------------------- Wound Assessment Details Patient Name: Date of Service: [name], [name] [name] 10/20/2021 9:15 [name] M Medical Record Number: [id number] Patient Account Number: [id number] Date of Birth/Sex: Treating RN: 10-16-1986 (35 y.o. [name] Primary Care Toye Rouillard: [name], [name] Other Clinician: Referring Naryiah Schley: Treating Freddie Dymek/Extender: [name]: 302 Wound Status Wound Number: 55 Primary Etiology: Neuropathic Ulcer-Non Diabetic Wound Location: Left T Great oe Wound Status: Open Wounding Event: Blister Comorbid History: Sleep Apnea, Hypertension, Paraplegia Date Acquired: 05/26/2021 Weeks Of Treatment: 21 Clustered Wound: No Photos Wound Measurements Length: (cm) 0.3 Width: (cm) 0.2 Depth: (cm) 0.2 Area: (cm) 0.047 Volume: (cm) 0.009 % Reduction in Area: 94% % Reduction in Volume: 88.6% Epithelialization: Medium (34-66%) Tunneling: No Undermining: No Wound Description Classification: Full Thickness With Exposed Support Structures Wound Margin: Distinct, outline attached Exudate Amount: Medium Exudate Type: Serosanguineous Exudate Color: red, brown Foul Odor After Cleansing: No Slough/Fibrino No Wound Bed Granulation Amount: Large (67-100%) Exposed Structure Granulation Quality: Red, Friable Fascia Exposed: No Necrotic Amount: None Present (0%) Fat Layer (Subcutaneous Tissue) Exposed: Yes Tendon Exposed: No Muscle Exposed: No Joint Exposed: No Bone Exposed: No Treatment Notes Wound #55 (Toe Great) Wound Laterality: Left Cleanser Soap and Water Discharge Instruction: May shower and wash wound with dial antibacterial soap and water prior to dressing change. Peri-Wound Care Topical Primary Dressing Hydrofera Blue Ready Foam, 2.5 x2.5 in Discharge Instruction: Apply to wound bed as instructed Secondary  Dressing Woven Gauze Sponge, Non-Sterile 4x4 in Discharge Instruction: Apply over primary dressing as directed. Secured With [employer], 4.5x3.1 (in/yd) Discharge Instruction: Secure with Kerlix as directed. Transpore Surgical Tape, 2x10 (in/yd) Discharge Instruction: Secure dressing with tape as directed. Compression Wrap Compression Stockings Add-Ons Electronic Signature(s) Signed: 10/20/2021 5:43:30 PM By: Lorrin Jackson Entered By: Lorrin Jackson on 10/20/2021 09:55:01 -------------------------------------------------------------------------------- Wound Assessment Details Patient Name: Date of Service: [name], [name] [name] 10/20/2021 9:15 [name] M Medical Record Number: [id number] Patient Account Number: [id number] Date of Birth/Sex: Treating RN: [birth year] (35 y.o. [name] Primary Care Dalylah Ramey: [name], [name] Other Clinician: Referring Efrem Pitstick: Treating Meridian Scherger/Extender: [name]: 302 Wound Status Wound Number: 56 Primary Etiology: Neuropathic Ulcer-Non Diabetic Wound Location: Left, Dorsal Foot Wound Status: Open Wounding Event: Gradually Appeared Comorbid History: Sleep Apnea, Hypertension, Paraplegia Date Acquired: 07/11/2021 Weeks Of Treatment: 13 Clustered Wound: No Photos Wound Measurements Length: (cm) 2.2 Width: (cm) 2.2 Depth: (cm) 0.3 Area: (cm) 3.801 Volume: (cm) 1.14 % Reduction in Area: 38.9% % Reduction in Volume: -83.3% Epithelialization: Small (1-33%) Tunneling: No Undermining: No Wound Description Classification: Full Thickness Without Exposed Support Structures  Wound Margin: Distinct, outline attached Exudate Amount: Medium Exudate Type: Purulent Exudate Color: yellow, brown, green Foul Odor After Cleansing: No Slough/Fibrino Yes Wound Bed Granulation Amount: Large (67-100%) Exposed Structure Granulation Quality: Red Fascia Exposed: No Necrotic Amount: Small (1-33%) Fat Layer  (Subcutaneous Tissue) Exposed: Yes Necrotic Quality: Adherent Slough Tendon Exposed: No Muscle Exposed: No Joint Exposed: No Bone Exposed: No Treatment Notes Wound #56 (Foot) Wound Laterality: Dorsal, Left Cleanser Soap and Water Discharge Instruction: May shower and wash wound with dial antibacterial soap and water prior to dressing change. Peri-Wound Care Topical Gentamicin Discharge Instruction: Apply directly to wound bed, under alginate Primary Dressing Hydrofera Blue Classic Foam, 4x4 in Discharge Instruction: Moisten with saline prior to applying to wound bed Secondary Dressing Woven Gauze Sponge, Non-Sterile 4x4 in Discharge Instruction: Apply over primary dressing as directed. Secured With [employer], 4.5x3.1 (in/yd) Discharge Instruction: Secure with Kerlix as directed. Transpore Surgical Tape, 2x10 (in/yd) Discharge Instruction: Secure dressing with tape as directed. Compression Wrap Compression Stockings Add-Ons Electronic Signature(s) Signed: 10/20/2021 5:43:30 PM By: Lorrin Jackson Entered By: Lorrin Jackson on 10/20/2021 09:56:45 -------------------------------------------------------------------------------- Wound Assessment Details Patient Name: Date of Service: [name], [name] [name] 10/20/2021 9:15 [name] M Medical Record Number: [id number] Patient Account Number: [id number] Date of Birth/Sex: Treating RN: 1986/05/09 (35 y.o. [name] Primary Care Dallis Czaja: [name], [name] Other Clinician: Referring Trevante Tennell: Treating Midge Momon/Extender: [name]: 302 Wound Status Wound Number: 57 Primary Etiology: Neuropathic Ulcer-Non Diabetic Wound Location: Right, Plantar Foot Wound Status: Open Wounding Event: Gradually Appeared Comorbid History: Sleep Apnea, Hypertension, Paraplegia Date Acquired: 08/25/2021 Weeks Of Treatment: 8 Clustered Wound: No Photos Wound Measurements Length: (cm) 2 Width: (cm)  2 Depth: (cm) 0.1 Area: (cm) 3.142 Volume: (cm) 0.314 % Reduction in Area: -207.7% % Reduction in Volume: -207.8% Epithelialization: Small (1-33%) Tunneling: No Undermining: No Wound Description Classification: Full Thickness Without Exposed Support Structures Wound Margin: Distinct, outline attached Exudate Amount: Medium Exudate Type: Purulent Exudate Color: yellow, brown, green Foul Odor After Cleansing: No Slough/Fibrino Yes Wound Bed Granulation Amount: Medium (34-66%) Exposed Structure Granulation Quality: Red Fascia Exposed: No Necrotic Amount: Medium (34-66%) Fat Layer (Subcutaneous Tissue) Exposed: Yes Necrotic Quality: Adherent Slough Tendon Exposed: No Muscle Exposed: No Joint Exposed: No Bone Exposed: No Treatment Notes Wound #57 (Foot) Wound Laterality: Plantar, Right Cleanser Soap and Water Discharge Instruction: May shower and wash wound with dial antibacterial soap and water prior to dressing change. Peri-Wound Care Topical Primary Dressing Hydrofera Blue Ready Foam, 2.5 x2.5 in Discharge Instruction: Apply to wound bed as instructed Secondary Dressing Woven Gauze Sponge, Non-Sterile 4x4 in Discharge Instruction: Apply over primary dressing as directed. Secured With [employer], 4.5x3.1 (in/yd) Discharge Instruction: Secure with Kerlix as directed. 58M Medipore H Soft Cloth Surgical T 4 x 2 (in/yd) ape Discharge Instruction: Secure dressing with tape as directed. Compression Wrap Compression Stockings Add-Ons Electronic Signature(s) Signed: 10/20/2021 5:43:30 PM By: Lorrin Jackson Signed: 10/20/2021 5:43:30 PM By: Lorrin Jackson Entered By: Lorrin Jackson on 10/20/2021 10:01:32 -------------------------------------------------------------------------------- Wound Assessment Details Patient Name: Date of Service: [name], [name] [name] 10/20/2021 9:15 [name] M Medical Record Number: [id number] Patient Account Number: [id number] Date of  Birth/Sex: Treating RN: 05-21-1986 (35 y.o. [name] Primary Care Gerardo Caiazzo: [place], [place] Other Clinician: Referring Edmond Ginsberg: Treating Vallerie Hentz/Extender: [name]: 302 Wound Status Wound Number: 58 Primary Etiology: Trauma, Other Wound Location: Right, Lateral Lower Leg Wound Status: Open Wounding Event: Gradually Appeared Comorbid History: Sleep  Apnea, Hypertension, Paraplegia Date Acquired: 09/23/2021 Weeks Of Treatment: 2 Clustered Wound: No Photos Wound Measurements Length: (cm) 0.5 Width: (cm) 0.8 Depth: (cm) 0.1 Area: (cm) 0.314 Volume: (cm) 0.031 % Reduction in Area: 71.5% % Reduction in Volume: 71.8% Epithelialization: Medium (34-66%) Tunneling: No Undermining: No Wound Description Classification: Full Thickness Without Exposed Support Structures Wound Margin: Distinct, outline attached Exudate Amount: Medium Exudate Type: Serosanguineous Exudate Color: red, brown Foul Odor After Cleansing: No Slough/Fibrino No Wound Bed Granulation Amount: Large (67-100%) Exposed Structure Granulation Quality: Red, Friable Fascia Exposed: No Necrotic Amount: None Present (0%) Fat Layer (Subcutaneous Tissue) Exposed: Yes Tendon Exposed: No Muscle Exposed: No Joint Exposed: No Bone Exposed: No Assessment Notes Periwound erythema Treatment Notes Wound #58 (Lower Leg) Wound Laterality: Right, Lateral Cleanser Soap and Water Discharge Instruction: May shower and wash wound with dial antibacterial soap and water prior to dressing change. Peri-Wound Care Topical Primary Dressing Hydrofera Blue Ready Foam, 2.5 x2.5 in Discharge Instruction: Apply to wound bed as instructed Secondary Dressing Woven Gauze Sponge, Non-Sterile 4x4 in Discharge Instruction: Apply over primary dressing as directed. Secured With [employer], 4.5x3.1 (in/yd) Discharge Instruction: Secure with Kerlix as directed. Transpore Surgical  Tape, 2x10 (in/yd) Discharge Instruction: Secure dressing with tape as directed. Compression Wrap Compression Stockings Add-Ons Electronic Signature(s) Signed: 10/20/2021 5:43:30 PM By: Lorrin Jackson Entered By: Lorrin Jackson on 10/20/2021 10:00:13 -------------------------------------------------------------------------------- Vitals Details Patient Name: Date of Service: Buehler, [name] [name] 10/20/2021 9:15 [name] M Medical Record Number: [id number] Patient Account Number: [id number] Date of Birth/Sex: Treating RN: 11-18-1986 (35 y.o. [name] Primary Care Asaph Serena: [place], [place] Other Clinician: Referring Zorah Backes: Treating Maycen Degregory/Extender: [name]: 302 Vital Signs Time Taken: 09:36 Temperature (F): 98.3 Height (in): 70 Pulse (bpm): 128 Weight (lbs): 216 Respiratory Rate (breaths/min): 18 Body Mass Index (BMI): 31 Blood Pressure (mmHg): 134/79 Reference Range: 80 - 120 mg / dl Electronic Signature(s) Signed: 10/20/2021 5:43:30 PM By: Lorrin Jackson Entered By: Lorrin Jackson on 10/20/2021 09:36:58

## 2021-10-20 NOTE — Progress Notes (Signed)
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 not exactly sure of the issue here. 07/21/17; better surface today after 1 week of Iodoflex. Significant cellulitis that we treated last week also better. [Doxycycline] 07/28/17 better surface today with now 2 [name] of Iodoflex. Significant cellulitis treated  with doxycycline. He has now completed the doxycycline and he is back to his usual degree of chronic venous inflammation/stasis dermatitis. He reminds me he has had ablations surgery here 08/04/17; continued improvement with Iodoflex to the left lateral leg wound in terms of the surface of the wound although the dimensions are better. 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 Problematic areas on the plantar aspect of the first second toe web space on the left and the dorsal aspect of the first second toe web space on the right. At one point I felt these were probably related to chronic fungal infections in treated him aggressively for this although we have not made any improvement here. 08/11/17; left lateral leg. Surface continues to improve with the Iodoflex although we are not seeing much improvement in overall wound dimensions. Areas on his plantar left foot and right foot show no improvement. In fact the right foot looks somewhat worse 08/18/17; left lateral leg. We changed to [facility] Blue last week after [name] prolonged course of Iodoflex which helps get the surface better. It appears that the wound with is improved. Continue with difficult areas on the left dorsal first second and plantar first second on the right 09/01/17; patient  arrives in clinic today having had [name] temperature of 103 yesterday. He was seen in the ER and [facility]. The patient was concerned he could have cellulitis again in the right leg however they diagnosed him with [name] UTI and he is now on Keflex. He has [name] history of cellulitis which is been recurrent and difficult but this is been in the left leg, in the past 5 use doxycycline. He does in and out catheterizations at home which are risk factors for UTI 09/08/17; patient will be completing his Keflex this weekend. The erythema on the left leg is considerably better. He has [name] new wound today on the medial  part of the right leg small superficial almost looks like [name] skin tear. He has worsening of the area on the right dorsal first and second toe. His major area on the left lateral leg is better. Using Hydrofera Blue on all areas 09/15/17; gradual reduction in width on the long wound in the left lateral leg. No debridement required. He also has wounds on the plantar aspect of his left first second toe web space and on the dorsal aspect of the right first second toe web space. 09/22/17; there continues to be very gradual improvements in the dimensions of the left lateral leg wound. He hasn't round erythematous spot with might be pressure on his wheelchair. There is no evidence obviously of infection no purulence no warmth He has [name] dry scaled area on the plantar aspect of the left first second toe Improved area on the dorsal right first second toe. 09/29/17; left lateral leg wound continues to improve in dimensions mostly with an is still [name] fairly long but increasingly narrow wound. He has [name] dry scaled area on the plantar aspect of his left first second toe web space Increasingly concerning area on the dorsal right first second toe. In fact I am concerned today about possible cellulitis around this wound. The areas extending up his second toe and although there is deformities here almost appears to abut on the nailbed. 10/06/17; left lateral leg wound continues to make very gradual progress. Tissue culture I did from the right first second toe dorsal foot last time grew MRSA and enterococcus which was vancomycin sensitive. This was not sensitive to clindamycin or doxycycline. He is allergic to Zyvox and sulfa we have therefore arrange for him to have dalvance infusion tomorrow. He is had this in the past and tolerated it well 10/20/17; left lateral leg wound continues to make decent progress. This is certainly reduced in terms of with there is advancing epithelialization.The cellulitis in the right foot  looks better although he still has [name] deep wound in the dorsal aspect of the first second toe web space. Plantar left first toe web space on the left I think is making some progress 10/27/17; left lateral leg wound continues to make decent progress. Advancing epithelialization.using Hydrofera Blue The right first second toe web space wound is better-looking using silver alginate Improvement in the left plantar first second toe web space. Again using silver alginate 11/03/17 left lateral leg wound continues to make decent progress albeit slowly. Using [facility] The right per second toe web space continues to be [name] very problematic looking punched out wound. I obtained [name] piece of tissue for deep culture I did extensively treated this for fungus. It is difficult to imagine that this is [name] pressure area as the patient states other than going outside he doesn't really wear shoes at home The left plantar  first second toe web space looked fairly senescent. Necrotic edges. This required debridement change to [facility] Blue to all wound areas 11/10/17; left lateral leg wound continues to contract. Using Hydrofera Blue On the right dorsal first second toe web space dorsally. Culture I did of this area last week grew MRSA there is not an easy oral option in this patient was multiple antibiotic allergies or intolerances. This was only [name] rare culture isolate I'[name] therefore going to use Bactroban under silver alginate On the left plantar first second toe web space. Debridement is required here. This is also unchanged 11/17/17; left lateral leg wound continues to contract using Hydrofera Blue this is no longer the major issue. The major concern here is the right first second toe web space. He now has an open area going from dorsally to the plantar aspect. There is now wound on the inner lateral part of the first toe. Not [name] very viable surface on this. There is erythema spreading medially into the forefoot. No  major change in the left first second toe plantar wound 11/24/17; left lateral leg wound continues to contract using Hydrofera Blue. Nice improvement today The right first second toe web space all of this looks [name] lot less angry than last week. I have given him clindamycin and topical Bactroban for MRSA and terbinafine for the possibility of underlining tinea pedis that I could not control with ketoconazole. Looks somewhat better The area on the plantar left first second toe web space is weeping with dried debris around the wound 12/01/17; left lateral leg wound continues to contract he Hydrofera Blue. It is becoming thinner in terms of with nevertheless it is making good improvement. The right first second toe web space looks less angry but still [name] large necrotic-looking wounds starting on the plantar aspect of the right foot extending between the toes and now extensively on the base of the right second toe. I gave him clindamycin and topical Bactroban for MRSA anterior benefiting for the possibility of underlying tinea pedis. Not looking better today The area on the left first/second toe looks better. Debrided of necrotic debris 12/05/17* the patient was worked in urgently today because over the weekend he found blood on his incontinence bad when he woke up. He was found to have an ulcer by his wife who does most of his wound care. He came in today for [place] to look at this. He has not had [name] history of wounds in his buttocks in spite of his paraplegia. 12/08/17; seen in follow-up today at his usual appointment. He was seen earlier this week and found to have [name] new wound on his buttock. We also follow him for wounds on the left lateral leg, left first second toe web space and right first second toe web space 12/15/17; we have been using Hydrofera Blue to the left lateral leg which has improved. The right first second toe web space has also improved. Left first second toe web space plantar aspect looks stable.  The left buttock has worsened using Santyl. Apparently the buttock has drainage 12/22/17; we have been using Hydrofera Blue to the left lateral leg which continues to improve now 2 small wounds separated by normal skin. He tells [place] he had [name] fever up to 100 yesterday he is prone to UTIs but has not noted anything different. He does in and out catheterizations. The area between the first and second toes today does not look good necrotic surface covered with what looks to be  purulent drainage and erythema extending into the third toe. I had gotten this to something that I thought look better last time however it is not look good today. He also has [name] necrotic surface over the buttock wound which is expanded. I thought there might be infection under here so I removed [name] lot of the surface with [name] #5 curet though nothing look like it really needed culturing. He is been using Santyl to this area 12/27/17; his original wound on the left lateral leg continues to improve using Hydrofera Blue. I gave him samples of Baxdella although he was unable to take them out of fear for an allergic reaction ["lump in his throat"].the culture I did of the purulent drainage from his second toe last week showed both enterococcus and [name] set Enterobacter I was also concerned about the erythema on the bottom of his foot although paradoxically although this looks somewhat better today. 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 We've been using silver alginate here. They came up today that he sleeps in [name] recliner, I'[name] not sure why but I asked him to stop this 01/03/18; his original wound we've been using Hydrofera Blue is now separated into 2 areas. Ulcer on his left buttock is better he is off the recliner and sleeping in bed Finally both wound areas between his first and second toes also looks some better 01/10/18; his original wound on the  left lateral leg is now separated into 2 wounds we've been using Hydrofera Blue Ulcer on his left buttock has some drainage. There is [name] small probing site going into muscle layer superiorly.using silver alginate -He arrives today with [name] deep tissue injury on the left heel The wound on the dorsal aspect of his first second toe on the left looks [name] lot betterusing silver alginate ketoconazole The area on the first second toe web space on the right also looks [name] lot bette 01/17/18; his original wound on the left lateral leg continues to progress using Hydrofera Blue Ulcer on his left buttock also is smaller surface healthier except for [name] small probing site going into the muscle layer superiorly. 2.4 cm of tunneling in this area DTI on his left heel we have only been offloading. Looks better than last week no threatened open no evidence of infection the wound on the dorsal aspect of the first second toe on the left continues to look like it's regressing we have only been using silver alginate and terbinafine orally The area in the first second toe web space on the right also looks to be [name] lot better using silver alginate and terbinafine I think this was prompted by tinea pedis 01/31/18; the patient was hospitalized in [place] last week apparently for [name] complicated UTI. He was discharged on cefepime he does in and out catheterizations. In the hospital he was discovered [name] I don't mild elevation of AST and ALT and the terbinafine was stopped.predictably the pressure ulcer on s his buttock looks betterusing silver alginate. The area on the left lateral leg also is better using Hydrofera Blue. The area between the first and second toes on the left better. First and second toes on the right still substantial but better. Finally the DTI on the left heel has held together and looks like it's resolving 02/07/18-he is here in follow-up evaluation for multiple ulcerations. He has new injury to the lateral aspect of  the last  issue [name] pressure ulcer, he states this is from adhesive removal trauma. He states he has tried multiple adhesive products with no success. All other ulcers appear stable. The left heel DTI is resolving. We will continue with same treatment plan and follow-up next week. 02/14/18; follow-up for multiple areas. He has [name] new area last week on the lateral aspect of his pressure ulcer more over the posterior trochanter. The original pressure ulcer looks quite stable has healthy granulation. 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 Almost fully epithelialized on the original superior area using Hydrofera Blue DTI on the left heel has peeled off this week to reveal [name] small superficial wound under denuded skin and subcutaneous tissue Both areas between the first and second toes look better including nothing open on the left 02/21/18; The patient's wounds on his left ischial tuberosity and posterior left greater trochanter actually looked better. He has [name] large area of irritation around the area which I think is contact dermatitis. I am doubtful that this is fungal His original wound on the left lateral calf continues to improve we have been using Hydrofera Blue There is no open area in the left first second toe web space although there is [name] lot of thick callus The DTI on the left heel required debridement today of necrotic surface eschar and subcutaneous tissue using silver alginate Finally the area on the right first second toe webspace continues to contract using silver alginate and ketoconazole 02/28/18 Left ischial tuberosity wounds look better using silver alginate. Original wound on the left calf only has one small open area left using Hydrofera Blue DTI on the left heel required debridement mostly removing skin from around this wound surface. Using silver alginate The areas on the right  first/second toe web space using silver alginate and ketoconazole 03/08/18 on evaluation today patient appears to be doing decently well as best I can tell in regard to his wounds. This is the first time that I have seen him as he generally is followed by Dr. Dellia Nims. With that being said none of his wounds appear to be infected he does have an area where there is some skin covering what appears to be [name] new wound on the left dorsal surface of his great toe. This is right at the nail bed. With that being said I do believe that debrided away some of the excess skin can be of benefit in this regard. Otherwise he has been tolerating the dressing changes without complication. 03/14/18; patient arrives today with the multiplicity of wounds that we are following. He has not been systemically unwell Original wound on the left lateral calf now only has 2 small open areas we've been using Hydrofera Blue which should continue The deep tissue injury on the left heel requires debridement today. We've been using silver alginate The left first second toe and the right first second toe are both are reminiscence what I think was tinea pedis. Apparently some of the callus Surface between the toes was removed last week when it started draining. Purulent drainage coming from the wound on the ischial tuberosity on the left. 03/21/18-He is here in follow-up evaluation for multiple wounds. 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 He tolerated debridement. The only change to last week's recommendations is to discontinue antifungal cream between toes. He will follow-up next week 03/28/18; following  up for multiple wounds;Concern this week is streaking redness and swelling in the right foot. He is going to need antibiotics for this. 03/31/18; follow-up for right foot cellulitis. Streaking redness and swelling in the right foot on 03/28/18. He has multiple  antibiotic intolerances and [name] history of MRSA. I put him on clindamycin 300 mg every 6 and brought him in for [name] quick check. He has an open wound between his first and second toes on the right foot as [name] potential source. 04/04/18; Right foot cellulitis is resolving he is completing clindamycin. This is truly good news Left lateral calf wound which is initial wound only has one small open area inferiorly this is close to healing out. He has compression stockings. We will use Hydrofera Blue right down to the epithelialization of this Nonviable surface on the left heel which was initially pressure with [name] DTI. We've been using Hydrofera Blue. I'[name] going to switch this back to silver alginate Left first second toe/tinea pedis this looks better using silver alginate Right first second toe tinea pedis using silver alginate Large pressure ulcers on theLeft ischial tuberosity. Small wound here Looks better. I am uncertain about the surface over the large wound. Using silver alginate 04/11/18; Cellulitis in the right foot is resolved Left lateral calf wound which was his original wounds still has 2 tiny open areas remaining this is just about closed Nonviable surface on the left heel is better but still requires debridement Left first second toe/tinea pedis still open using silver alginate Right first second toe wound tinea pedis I asked him to go back to using ketoconazole and silver alginate Large pressure ulcers on the left ischial tuberosity this shear injury here is resolved. Wound is smaller. No evidence of infection using silver alginate 04/18/18; Patient arrives with an intense area of cellulitis in the right mid lower calf extending into the right heel area. Bright red and warm. Smaller area on the left anterior leg. He has [name] significant history of MRSA. He will definitely need antibioticsdoxycycline He now has 2 open areas on the left ischial tuberosity the original large wound and now [name] satellite  area which I think was above his initial satellite areas. Not [name] wonderful surface on this satellite area surrounding erythema which looks like pressure related. His left lateral calf wound again his original wound is just about closed Left heel pressure injury still requiring debridement Left first second toe looks [name] lot better using silver alginate Right first second toe also using silver alginate and ketoconazole cream also looks better 04/20/18; the patient was worked in early today out of concerns with his cellulitis on the right leg. I had started him on doxycycline. This was 2 days ago. His wife was concerned about the swelling in the area. Also concerned about the left buttock. He has not been systemically unwell no fever chills. No nausea vomiting or diarrhea 04/25/18; the patient's left buttock wound is continued to deteriorate he is using Hydrofera Blue. He is still completing clindamycin for the cellulitis on the right leg although all of this looks better. 05/02/18 Left buttock wound still with [name] lot of drainage and [name] very tightly adherent fibrinous necrotic surface. He has [name] deeper area superiorly The left lateral calf wound is still closed DTI wound on the left heel necrotic surface especially the circumference using Iodoflex Areas between his left first second toe and right first second toe both look better. Dorsally and the right first second toe he had [name]  necrotic surface although at smaller. In using silver alginate and ketoconazole. I did [name] culture last week which was [name] deep tissue culture of the reminiscence of the open wound on the right first second toe dorsally. This grew [name] few Acinetobacter and [name] few methicillin-resistant staph aureus. Nevertheless the area actually this week looked better. I didn't feel the need to specifically address this at least in terms of systemic antibiotics. 05/09/18; wounds are measuring larger more drainage per our intake. We are using Santyl covered  with alginate on the large superficial buttock wounds, Iodosorb on the left heel, ketoconazole and silver alginate to the dorsal first and second toes bilaterally. 05/16/18; The area on his left buttock better in some aspects although the area superiorly over the ischial tuberosity required an extensive debridement.using Santyl Left heel appears stable. Using Iodoflex The areas between his first and second toes are not bad however there is spreading erythema up the dorsal aspect of his left foot this looks like cellulitis again. He is insensate the erythema is really very brilliant.o Erysipelas He went to see an allergist days ago because he was itching part of this he had lab work done. This showed [name] white count of 15.1 with 70% neutrophils. Hemoglobin of 11.4 and [name] platelet count of 659,000. Last white count we had in Epic was [name] 2-1/2 years ago which was 25.9 but he was ill at the time. He was able to show me some lab work that was done by his primary physician the pattern is about the same. 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 But prompted me to go ahead and do x-rays of both feet and the pelvis rule out osteomyelitis. He also had [name] comprehensive metabolic panel this was reasonably normal his albumin was 3.7 liver function tests BUN/creatinine all normal 05/23/18; x-rays of both his feet from last week were negative for underlying pulmonary abnormality. The x-ray of his pelvis however showed mild irregularity in the left ischial which may represent some early osteomyelitis. The wound in the left ischial continues to get deeper clearly now exposed muscle. Each week necrotic surface material over this area. Whereas the rest of the wounds do not look so bad. The left ischial wound we have been using Santyl and calcium alginate T the left heel surface necrotic debris using Iodoflex o The left lateral leg is still healed Areas on the left dorsal foot  and the right dorsal foot are about the same. There is some inflammation on the left which might represent contact dermatitis, fungal dermatitis I am doubtful cellulitis although this looks better than last week 05/30/18; CT scan done at Hospital did not show any osteomyelitis or abscess. Suggested the possibility of underlying cellulitis although I don't see [name] lot of evidence of this at the bedside The wound itself on the left buttock/upper thigh actually looks somewhat better. No debridement Left heel also looks better no debridement continue Iodoflex Both dorsal first second toe spaces appear better using Lotrisone. Left still required debridement 06/06/18; Intake reported some purulent looking drainage from the left gluteal wound. Using Santyl and calcium alginate Left heel looks better although still [name] nonviable surface requiring debridement The left dorsal foot first/second webspace actually expanding and somewhat deeper. I may consider doing [name] shave biopsy of this area Right dorsal foot first/second webspace appears stable to improved. Using Lotrisone and silver alginate to both these areas 06/13/18 Left gluteal surface looks better. Now  separated in the 2 wounds. No debridement required. Still drainage. We'll continue silver alginate Left heel continues to look better with Iodoflex continue this for at least another week Of his dorsal foot wounds the area on the left still has some depth although it looks better than last week. We've been using Lotrisone and silver alginate 06/20/18 Left gluteal continues to look better healthy tissue Left heel continues to look better healthy granulation wound is smaller. He is using Iodoflex and his long as this continues continue the Iodoflex Dorsal right foot looks better unfortunately dorsal left foot does not. There is swelling and erythema of his forefoot. He had minor trauma to this several days ago but doesn't think this was enough to have caused any  tissue injury. Foot looks like cellulitis, we have had this problem before 06/27/18 on evaluation today patient appears to be doing [name] little worse in regard to his foot ulcer. 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 Both of which would really be his only options for treating this infection. In the past he has been given and effusion of Orbactiv. This is done very well for him in the past again it's one time dosing IV antibiotic therapy. Subsequently I do believe this is something we're gonna need to see about doing at this point in time. Currently his other wounds seem to be doing somewhat better in my pinion I'[name] pretty happy in that regard. 07/03/18 on evaluation today patient's wounds actually appear to be doing fairly well. He has been tolerating the dressing changes without complication. All in all he seems to be showing signs of improvement. In regard to the antibiotics he has been dealing with infectious disease since I saw him last week as far as getting this scheduled. In the end [facility]'s [facility] this coming Friday. In the meantime he has been continuing to perform the dressing changes in such as previous. There does not appear to be any evidence of infection worsengin at this time. 07/10/18; Since I last saw this man 2 [name] ago things have actually improved. IV antibiotics of resulted in less forefoot erythema although there is still some present. He is not systemically unwell Left buttock wounds 2 now have no depth there is increased epithelialization Using silver alginate Left heel still requires debridement using Iodoflex Left dorsal foot still with [name] sizable wound about the size of [name] border but healthy granulation Right dorsal foot still with [name] slitlike area using silver alginate 07/18/18;  the patient's cellulitis in the left foot is improved in fact I think it is on its way to resolving. Left buttock wounds 2 both look better although the larger one has hypertension granulation we've been using silver alginate Left heel has some thick circumferential redundant skin over the wound edge which will need to be removed today we've been using Iodoflex Left dorsal foot is still [name] sizable wound required debridement using silver alginate The right dorsal foot is just about closed only [name] small open area remains here 07/25/18; left foot cellulitis is resolved Left buttock wounds 2 both look better. Hyper-granulation on the major area Left heel as some debris over the surface but otherwise looks [name] healthier wound. Using silver collagen Right dorsal foot is just about closed 07/31/18; arrives with our intake  nurse worried about purulent drainage from the buttock. We had hyper-granulation here last week His buttock wounds 2 continue to look better Left heel some debris over the surface but measuring smaller. Right dorsal foot unfortunately has openings between the toes Left foot superficial wound looks less aggravated. 08/07/18 Buttock wounds continue to look better although some of her granulation and the larger medial wound. silver alginate Left heel continues to look [name] lot better.silver collagen Left foot superficial wound looks less stable. Requires debridement. He has [name] new wound superficial area on the foot on the lateral dorsal foot. Right foot looks better using silver alginate without Lotrisone 08/14/2018; patient was in the ER last week diagnosed with [name] UTI. He is now on Cefpodoxime and Macrodantin. Buttock wounds continued to be smaller. Using silver alginate Left heel continues to look better using silver collagen Left foot superficial wound looks as though it is improving Right dorsal foot area is just about healed. 08/21/2018; patient is completed his antibiotics for his UTI. He has  2 open areas on the buttocks. There is still not closed although the surface looks satisfactory. Using silver alginate Left heel continues to improve using silver collagen The bilateral dorsal foot areas which are at the base of his first and second toes/possible tinea pedis are actually stable on the left but worse on the right. The area on the left required debridement of necrotic surface. After debridement I obtained [name] specimen for PCR culture. The right dorsal foot which is been just about healed last week is now reopened 08/28/2018; culture done on the left dorsal foot showed coag negative staph both staph epidermidis and Lugdunensis. I think this is worthwhile initiating systemic treatment. I will use doxycycline given his long list of allergies. The area on the left heel slightly improved but still requiring debridement. The large wound on the buttock is just about closed whereas the smaller one is larger. Using silver alginate in this area 09/04/2018; patient is completing his doxycycline for the left foot although this continues to be [name] very difficult wound area with very adherent necrotic debris. We are using silver alginate to all his wounds right foot left foot and the small wounds on his buttock, silver collagen on the left heel. 09/11/2018; once again this patient has intense erythema and swelling of the left forefoot. Lesser degrees of erythema in the right foot. He has [name] long list of allergies and intolerances. I will reinstitute doxycycline. 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 Using silver alginate Left heel also looks better using silver collagen Unfortunately both the areas on his feet look worse. The area on the left first second webspace is now gone through to the plantar part of his foot. The area on the left foot anteriorly is irritated with erythema and swelling in the forefoot. 09/25/2018 His wound on the left plantar heel  looks better. Using silver collagen The area on the left buttock 2 small remnant areas. One is closed one is still open. Using silver alginate The areas between both his first and second toes look worse. This in spite of long-standing antifungal therapy with ketoconazole and silver alginate which should have antifungal activity He has small areas around his original wound on the left calf one is on the bottom of the original scar tissue and one superiorly both of these are small and superficial but again given wound history in this site this is worrisome 10/02/2018  Left plantar heel continues to gradually contract using silver collagen Left buttock wound is unchanged using silver alginate The areas on his dorsal feet between his first and second toes bilaterally look about the same. I prescribed clindamycin ointment to see if we can address chronic staph colonization and also the underlying possibility of erythrasma The left lateral lower extremity wound is actually on the lateral part of his ankle. Small open area here. We have been using silver alginate 10/09/2018; Left plantar heel continues to look healthy and contract. No debridement is required Left buttock slightly smaller with [name] tape injury wound just below which was new this week Dorsal feet somewhat improved I have been using clindamycin Left lateral looks lower extremity the actual open area looks worse although [name] lot of this is epithelialized. I am going to change to silver collagen today He has [name] lot more swelling in the right leg although this is not pitting not red and not particularly warm there is [name] lot of spasm in the right leg usually indicative of people with paralysis of some underlying discomfort. We have reviewed his vascular status from 2017 he had [name] left greater saphenous vein ablation. I wonder about referring him back to vascular surgery if the area on the left leg continues to deteriorate. 10/16/2018 in today for  follow-up and management of multiple lower extremity ulcers. His left Buttock wound is much lower smaller and almost closed completely. The wound to the left ankle has began to reopen with Epithelialization and some adherent slough. He has multiple new areas to the left foot and leg. The left dorsal foot without much improvement. Wound present between left great webspace and 2nd toe. Erythema and edema present right leg. Right LE ultrasound obtained on 10/10/18 was negative for DVT . 10/23/2018; Left buttock is closed over. Still dry macerated skin but there is no open wound. I suspect this is chronic pressure/moisture Left lateral calf is quite [name] bit worse than when I saw this last. There is clearly drainage here he has macerated skin into the left plantar heel. We will change the primary dressing to alginate Left dorsal foot has some improvement in overall wound area. Still using clindamycin and silver alginate Right dorsal foot about the same as the left using clindamycin and silver alginate The erythema in the right leg has resolved. He is DVT rule out was negative Left heel pressure area required debridement although the wound is smaller and the surface is health 10/26/2018 The patient came back in for his nurse check today predominantly because of the drainage coming out of the left lateral leg with [name] recent reopening of his original wound on the left lateral calf. He comes in today with [name] large amount of surrounding erythema around the wound extending from the calf into the ankle and even in the area on the dorsal foot. He is not systemically unwell. He is not febrile. Nevertheless this looks like cellulitis. We have been using silver alginate to the area. I changed him to [name] regular visit and I am going to prescribe him doxycycline. The rationale here is [name] long list of medication intolerances and [name] history of MRSA. I did not see anything that I thought would provide [name] valuable culture  10/30/2018 Follow-up from his appointment 4 days ago with really an extensive area of cellulitis in the left calf left lateral ankle and left dorsal foot. I put him on doxycycline. He has [name] long list of medication allergies which are  true allergy reactions. 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 In any case he is [name] lot better today. The erythema especially in the anterior and lateral left calf is better. He still has left ankle erythema. He also is complaining about increasing edema in the right leg we have only been using Kerlix Coban and he has been doing the wraps at home. Finally he has [name] spotty rash on the medial part of his upper left calf which looks like folliculitis or perhaps wrap occlusion type injury. Small superficial macules not pustules 11/06/18 patient arrives today with again [name] considerable degree of erythema around the wound on the left lateral calf extending into the dorsal ankle and dorsal foot. This is [name] lot worse than when I saw this last week. He is on doxycycline really with not [name] lot of improvement. He has not been systemically unwell Wounds on the; left heel actually looks improved. Original area on the left foot and proximity to the first and second toes looks about the same. He has superficial areas on the dorsal foot, anterior calf and then the reopening of his original wound on the left lateral calf which looks about the same The only area he has on the right is the dorsal webspace first and second which is smaller. He has [name] large area of dry erythematous skin on the left buttock small open area here. 11/13/2018; the patient arrives in much better condition. The erythema around the wound on the left lateral calf is [name] lot better. Not sure whether this was the clindamycin or the TCA and ketoconazole or just in the improvement in edema control [stasis dermatitis]. In any case this is [name] lot better. The  area on the left heel is very small and just about resolved using silver collagen we have been using silver alginate to the areas on his dorsal feet 11/20/2018; his wounds include the left lateral calf, left heel, dorsal aspects of both feet just proximal to the first second webspace. He is stable to slightly improved. I did not think any changes to his dressings were going to be necessary 11/27/2018 he has [name] reopening on the left buttock which is surrounded by what looks like tinea or perhaps some other form of dermatitis. The area on the left dorsal foot has some erythema around it I have marked this area but I am not sure whether this is cellulitis or not. Left heel is not closed. Left calf the reopening is really slightly longer and probably worse 1/13; in general things look better and smaller except for the left dorsal foot. Area on the left heel is just about closed, left buttock looks better only [name] small wound remains in the skin looks better [using Lotrisone] 1/20; the area on the left heel only has [name] few remaining open areas here. Left lateral calf about the same in terms of size, left dorsal foot slightly larger right lateral foot still not closed. The area on the left buttock has no open wound and the surrounding skin looks [name] lot better 1/27; the area on the left heel is closed. Left lateral calf better but still requiring extensive debridements. The area on his left buttock is closed. He still has the open areas on the left dorsal foot which is slightly smaller in the right foot which is slightly expanded. We have been using Iodoflex on these areas as well 2/3; left heel is closed. Left  lateral calf still requiring debridement using Iodoflex there is no open area on his left buttock however he has dry scaly skin over [name] large area of this. Not really responding well to the Lotrisone. Finally the areas on his dorsal feet at the level of the first second webspace are slightly smaller on the  right and about the same on the left. Both of these vigorously debrided with Anasept and gauze 2/10; left heel remains closed he has dry erythematous skin over the left buttock but there is no open wound here. Left lateral leg has come in and with. Still requiring debridement we have been using Iodoflex here. Finally the area on the left dorsal foot and right dorsal foot are really about the same extremely dry callused fissured areas. He does not yet have [name] dermatology appointment 2/17; left heel remains closed. He has [name] new open area on the left buttock. The area on the left lateral calf is bigger longer and still covered in necrotic debris. No major change in his foot areas bilaterally. I am awaiting for [name] dermatologist to look on this. We have been using ketoconazole I do not know that this is been doing any good at all. 2/24; left heel remains closed. The left buttock wound that was new reopening last week looks better. The left lateral calf appears better also although still requires debridement. The major area on his foot is the left first second also requiring debridement. We have been putting Prisma on all wounds. I do not believe that the ketoconazole has done too much good for his feet. He will use Lotrisone I am going to give him [name] 2-week course of terbinafine. We still do not have [name] dermatology appointment 3/2 left heel remains closed however there is skin over bone in this area I pointed this out to him today. The left buttock wound is epithelialized but still does not look completely stable. The area on the left leg required debridement were using silver collagen here. With regards to his feet we changed to Lotrisone last week and silver alginate. 3/9; left heel remains closed. Left buttock remains closed. The area on the right foot is essentially closed. The left foot remains unchanged. Slightly smaller on the left lateral calf. Using silver collagen to both of these areas 3/16-Left  heel remains closed. Area on right foot is closed. Left lateral calf above the lateral malleolus open wound requiring debridement with easy bleeding. Left dorsal wound proximal to first toe also debrided. Left ischial area open new. Patient has been using Prisma with wrapping every 3 days. Dermatology appointment is apparently tomorrow.Patient has completed his terbinafine 2-week course with some apparent improvement according to him, there is still flaking and dry skin in his foot on the left 3/23; area on the right foot is reopened. The area on the left anterior foot is about the same still [name] very necrotic adherent surface. He still has the area on the left leg and reopening is on the left buttock. He apparently saw dermatology although I do not have [name] note. According to the patient who is usually fairly well informed they did not have any good ideas. Put him on oral terbinafine which she is been on before. 3/30; using silver collagen to all wounds. Apparently his dermatologist put him on doxycycline and rifampin presumably some culture grew staph. I do not have this result. He remains on terbinafine although I have used terbinafine on him before 4/6; patient has had [name] fairly  substantial reopening on the right foot between the first and second toes. He is finished his terbinafine and I believe is on doxycycline and rifampin still as prescribed by dermatology. We have been using silver collagen to all his wounds although the patient reports that he thinks silver alginate does better on the wounds on his buttock. 4/13; the area on his left lateral calf about the same size but it did not require debridement. Left dorsal foot just proximal to the webspace between the first and second toes is about the same. Still nonviable surface. I note some superficial bronze discoloration of the dorsal part of his foot Right dorsal foot just proximal to the first and second toes also looks about the same. I still  think there may be the same discoloration I noted above on the left Left buttock wound looks about the same 4/20; left lateral calf appears to be gradually contracting using silver collagen. 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 The left dorsal foot wound is debrided of tightly adherent necrotic debris and really cleans up quite nicely. The right area is worse with expansion. I did not debride this it is now over the base of the second toe The area on his left buttock is smaller no debridement is required using silver collagen 5/4; left calf continues to make good progress. He arrives with erythema around the wounds on his dorsal foot which even extends to the plantar aspect. Very concerning for coexistent infection. He is finished the erythromycin I gave him for possible erythrasma this does not seem to have helped. The area on the left foot is about the same base of the dorsal toes Is area on the buttock looks improved on the left 5/11; left calf and left buttock continued to make good progress. Left foot is about the same to slightly improved. Major problem is on the right foot. He has not had an x-ray. Deep tissue culture I did last week showed both Enterobacter and E. coli. I did not change the doxycycline I put him on empirically although neither 1 of these were plated to doxycycline. He arrives today with the erythema looking worse on both the dorsal and plantar foot. Macerated skin on the bottom of the foot. he has not been systemically unwell 5/18-Patient returns at 1 week, left calf wound appears to be making some progress, left buttock wound appears slightly worse than last time, left foot wound looks slightly better, right foot redness is marginally better. X-ray of both feet show no air or evidence of osteomyelitis. Patient is finished his Omnicef and terbinafine. He continues to have macerated skin on the bottom of the left  foot as well as right 5/26; left calf wound is better, left buttock wound appears to have multiple small superficial open areas with surrounding macerated skin. X-rays that I did last time showed no evidence of osteomyelitis in either foot. He is finished cefdinir and doxycycline. I do not think that he was on terbinafine. He continues to have [name] large superficial open area on the right foot anterior dorsal and slightly between the first and second toes. I did send him to dermatology 2 months ago or so wondering about whether they would do [name] fungal scraping. I do not believe they did but did do [name] culture. We have been using silver alginate to the toe areas, he has been using antifungals at home topically either ketoconazole or Lotrisone. We are using silver collagen on  the left foot, silver alginate on the right, silver collagen on the left lateral leg and silver alginate on the left buttock 6/1; left buttock area is healed. We have the left dorsal foot, left lateral leg and right dorsal foot. We are using silver alginate to the areas on both feet and silver collagen to the area on his left lateral calf 6/8; the left buttock apparently reopened late last week. He is not really sure how this happened. He is tolerating the terbinafine. Using silver alginate to all wounds 6/15; left buttock wound is larger than last week but still superficial. Came in the clinic today with [name] report of purulence from the left lateral leg I did not identify any infection Both areas on his dorsal feet appear to be better. He is tolerating the terbinafine. Using silver alginate to all wounds 6/22; left buttock is about the same this week, left calf quite [name] bit better. His left foot is about the same however he comes in with erythema and warmth in the right forefoot once again. Culture that I gave him in the beginning of May showed Enterobacter and E. coli. I gave him doxycycline and things seem to improve although neither 1  of these organisms was specifically plated. 6/29; left buttock is larger and dry this week. Left lateral calf looks to me to be improved. Left dorsal foot also somewhat improved right foot completely unchanged. The erythema on the right foot is still present. He is completing the Ceftin dinner that I gave him empirically [see discussion above.) 7/6 - All wounds look to be stable and perhaps improved, the left buttock wound is slightly smaller, per patient bleeds easily, completed ceftin, the right foot redness is less, he is on terbinafine 7/13; left buttock wound about the same perhaps slightly narrower. Area on the left lateral leg continues to narrow. Left dorsal foot slightly smaller right foot about the same. We are using silver alginate on the right foot and Hydrofera Blue to the areas on the left. Unna boot on the left 2 layer compression on the right 7/20; left buttock wound absolutely the same. Area on lateral leg continues to get better. Left dorsal foot require debridement as did the right no major change in the 7/27; left buttock wound the same size necrotic debris over the surface. The area on the lateral leg is closed once again. His left foot looks better right foot about the same although there is some involvement now of the posterior first second toe area. He is still on terbinafine which I have given him for [name] month, not certain [name] centimeter major change 06/25/19-All wounds appear to be slightly improved according to report, left buttock wound looks clean, both foot wounds have minimal to no debris the right dorsal foot has minimal slough. We are using Hydrofera Blue to the left and silver alginate to the right foot and ischial wound. 8/10-Wounds all appear to be around the same, the right forefoot distal part has some redness which was not there before, however the wound looks clean and small. Ischial wound looks about the same with no changes 8/17; his wound on the left lateral  calf which was his original chronic venous insufficiency wound remains closed. Since I last saw him the areas on the left dorsal foot right dorsal foot generally appear better but require debridement. The area on his left initial tuberosity appears somewhat larger to me perhaps hyper granulated and bleeds very easily. We have been using Hydrofera  Blue to the left dorsal foot and silver alginate to everything else 8/24; left lateral calf remains closed. The areas on his dorsal feet on the webspace of the first and second toes bilaterally both look better. The area on the left buttock which is the pressure ulcer stage II slightly smaller. I change the dressing to Hydrofera Blue to all areas 8/31; left lateral calf remains closed. The area on his dorsal feet bilaterally look better. Using Hydrofera Blue. Still requiring debridement on the left foot. No change in the left buttock pressure ulcers however 9/14; left lateral calf remains closed. Dorsal feet look quite [name] bit better than 2 [name] ago. Flaking dry skin also [name] lot better with the ammonium lactate I gave him 2 [name] ago. The area on the left buttock is improved. He states that his Roho cushion developed [name] leak and he is getting [name] new one, in the interim he is offloading this vigorously 9/21; left calf remains closed. Left heel which was [name] possible DTI looks better this week. He had macerated tissue around the left dorsal foot right foot looks satisfactory and improved left buttock wound. I changed his dressings to his feet to silver alginate bilaterally. Continuing Hydrofera Blue on the left buttock. 9/28 left calf remains closed. Left heel did not develop anything [possible DTI] dry flaking skin on the left dorsal foot. Right foot looks satisfactory. Improved left buttock wound. We are using silver alginate on his feet Hydrofera Blue on the buttock. I have asked him to go back to the Lotrisone on his feet including the wounds and surrounding  areas 10/5; left calf remains closed. The areas on the left and right feet about the same. [name] lot of this is epithelialized however debris over the remaining open areas. He is using Lotrisone and silver alginate. The area on the left buttock using Hydrofera Blue 10/26. Patient has been out for 3 [name] secondary to Covid concerns. He tested negative but I think his wife tested positive. He comes in today with the left foot substantially worse, right foot about the same. Even more concerning he states that the area on his left buttock closed over but then reopened and is considerably deeper in one aspect than it was before [stage III wound] 11/2; left foot really about the same as last week. Quarter sized wound on the dorsal foot just proximal to the first second toes. Surrounding erythema with areas of denuded epithelium. This is not really much different looking. Did not look like cellulitis this time however. Right foot area about the same.. We have been using silver alginate alginate on his toes Left buttock still substantial irritated skin around the wound which I think looks somewhat better. We have been using Hydrofera Blue here. 11/9; left foot larger than last week and [name] very necrotic surface. Right foot I think is about the same perhaps slightly smaller. Debris around the circumference also addressed. Unfortunately on the left buttock there is been [name] decline. Satellite lesions below the major wound distally and now [name] an additional one posteriorly we have been using Hydrofera Blue but I think this is [name] pressure issue 11/16; left foot ulcer dorsally again [name] very adherent necrotic surface. Right foot is about the same. Not much change in the pressure ulcer on his left buttock. 11/30; left foot ulcer dorsally basically the same as when I saw him 2 [name] ago. Very adherent fibrinous debris on the wound surface. Patient reports [name] lot of drainage as well.  The character of this wound has changed  completely although it has always been refractory. We have been using Iodoflex, patient changed back to alginate because of the drainage. Area on his right dorsal foot really looks benign with [name] healthier surface certainly [name] lot better than on the left. Left buttock wounds all improved using Hydrofera Blue 12/7; left dorsal foot again no improvement. Tightly adherent debris. PCR culture I did last week only showed likely skin contaminant. I have gone ahead and done [name] punch biopsy of this which is about the last thing in terms of investigations I can think to do. He has known venous insufficiency and venous hypertension and this could be the issue here. The area on the right foot is about the same left buttock slightly worse according to our intake nurse secondary to [facility] Blue sticking to the wound 12/14; biopsy of the left foot that I did last time showed changes that could be related to wound healing/chronic stasis dermatitis phenomenon no neoplasm. We have been using silver alginate to both feet. I change the one on the left today to Sorbact and silver alginate to his other 2 wounds 12/28; the patient arrives with the following problems; Major issue is the dorsal left foot which continues to be [name] larger deeper wound area. Still with [name] completely nonviable surface Paradoxically the area mirror image on the right on the right dorsal foot appears to be getting better. He had some loss of dry denuded skin from the lower part of his original wound on the left lateral calf. Some of this area looked [name] little vulnerable and for this reason we put him in wrap that on this side this week The area on his left buttock is larger. He still has the erythematous circular area which I think is [name] combination of pressure, sweat. This does not look like cellulitis or fungal dermatitis 11/26/2019; -Dorsal left foot large open wound with depth. Still debris over the surface. Using Sorbact The area on the dorsal  right foot paradoxically has closed over He has [name] reopening on the left ankle laterally at the base of his original wound that extended up into the calf. This appears clean. The left buttock wound is smaller but with very adherent necrotic debris over the surface. We have been using silver alginate here as well The patient had arterial studies done in 2017. He had biphasic waveforms at the dorsalis pedis and posterior tibial bilaterally. ABI in the left was 1.17. Digit waveforms were dampened. He has slight spasticity in the great toes I do not think [name] TBI would be possible 1/11; the patient comes in today with [name] sizable reopening between the first and second toes on the right. This is not exactly in the same location where we have been treating wounds previously. According to our intake nurse this was actually fairly deep but 0.6 cm. The area on the left dorsal foot looks about the same the surface is somewhat cleaner using Sorbact, his MRI is in 2 days. We have not managed yet to get arterial studies. The new reopening on the left lateral calf looks somewhat better using alginate. The left buttock wound is about the same using alginate 1/18; the patient had his ARTERIAL studies which were quite normal. ABI in the right at 1.13 with triphasic/biphasic waveforms on the left ABI 1.06 again with triphasic/biphasic waveforms. It would not have been possible to have done [name] toe brachial index because of spasticity. We have been using  Sorbac to the left foot alginate to the rest of his wounds on the right foot left lateral calf and left buttock 1/25; arrives in clinic with erythema and swelling of the left forefoot worse over the first MTP area. This extends laterally dorsally and but also posteriorly. Still has an area on the left lateral part of the lower part of his calf wound it is eschared and clearly not closed. Area on the left buttock still with surrounding irritation and erythema. Right foot  surface wound dorsally. The area between the right and first and second toes appears better. 2/1; The left foot wound is about the same. Erythema slightly better I gave him [name] week of doxycycline empirically Right foot wound is more extensive extending between the toes to the plantar surface Left lateral calf really no open surface on the inferior part of his original wound however the entire area still looks vulnerable Absolutely no improvement in the left buttock wound required debridement. 2/8; the left foot is about the same. Erythema is slightly improved I gave him clindamycin last week. Right foot looks better he is using Lotrimin and silver alginate He has [name] breakdown in the left lateral calf. Denuded epithelium which I have removed Left buttock about the same were using Hydrofera Blue 2/15; left foot is about the same there is less surrounding erythema. Surface still has tightly adherent debris which I have debriding however not making any progress Right foot has [name] substantial wound on the medial right second toe between the first and second webspace. Still an open area on the left lateral calf distal area. Buttock wound is about the same 2/22; left foot is about the same less surrounding erythema. Surface has adherent debris. Polymen Ag Right foot area significant wound between the first and second toes. We have been using silver alginate here Left lateral leg polymen Ag at the base of his original venous insufficiency wound Left buttock some improvement here 3/1; Right foot is deteriorating in the first second toe webspace. Larger and more substantial. We have been using silver alginate. Left dorsal foot about the same markedly adherent surface debris using PolyMem Ag Left lateral calf surface debris using PolyMem AG Left buttock is improved again using PolyMem Ag. He is completing his terbinafine. The erythema in the foot seems better. He has been on this for 2 [name] 3/8; no  improvement in any wound area in fact he has [name] small open area on the dorsal midfoot which is new this week. He has not gotten his foot x-rays yet 3/15; his x-rays were both negative for osteomyelitis of both feet. No major change in any of his wounds on the extremities however his buttock wounds are better. We have been using polymen on the buttocks, left lower leg. Iodoflex on the left foot and silver alginate on the right 3/22; arrives in clinic today with the 2 major issues are the improvement in the left dorsal foot wound which for once actually looks healthy with [name] nice healthy wound surface without debridement. Using Iodoflex here. Unfortunately on the left lateral calf which is in the distal part of his original wound he came to the clinic here for there was purulent drainage noted some increased breakdown scattered around the original area and [name] small area proximally. We we are using polymen here will change to silver alginate today. His buttock wound on the left is better and I think the area on the right first second toe webspace is also improved  3/29; left dorsal foot looks better. Using Iodoflex. Left ankle culture from deterioration last time grew E. coli, Enterobacter and Enterococcus. I will give him [name] course of cefdinir although that will not cover Enterococcus. The area on the right foot in the webspace of the first and second toe lateral first toe looks better. The area on his buttock is about healed Vascular appointment is on April 21. This is to look at his venous system vis--vis continued breakdown of the wounds on the left including the left lateral leg and left dorsal foot he. He has had previous ablations on this side 4/5; the area between the right first and second toes lateral aspect of the first toe looks better. Dorsal aspect of the left first toe on the left foot also improved. Unfortunately the left lateral lower leg is larger and there is [name] second satellite wound  superiorly. The usual superficial abrasions on the left buttock overall better but certainly not closed 4/12; the area between the right first and second toes is improved. Dorsal aspect of the left foot also slightly smaller with [name] vibrant healthy looking surface. No real change in the left lateral leg and the left buttock wound is healed He has an unaffordable co-pay for Apligraf. Appointment with vein and vascular with regards to the left leg venous part of the circulation is on 4/21 4/19; we continue to see improvement in all wound areas. Although this is minor. He has his vascular appointment on 4/21. The area on the left buttock has not reopened although right in the center of this area the skin looks somewhat threatened 4/26; the left buttock is unfortunately reopened. In general his left dorsal foot has [name] healthy surface and looks somewhat smaller although it was not measured as such. The area between his first and second toe webspace on the right as [name] small wound against the first toe. The patient saw vascular surgery. The real question I was asking was about the small saphenous vein on the left. He has previously ablated left greater saphenous vein. Nothing further was commented on on the left. Right greater saphenous vein without reflux at the saphenofemoral junction or proximal thigh there was no indication for ablation of the right greater saphenous vein duplex was negative for DVT bilaterally. They did not think there was anything from [name] point of view that could be offered. They ABIs within normal limits 5/3; only small open area on the left buttock. The area on the left lateral leg which was his original venous reflux is now 2 wounds both which look clean. We are using Iodoflex on the left dorsal foot which looks healthy and smaller. He is down to [name] very tiny area between the right first and second toes, using silver alginate 5/10; all of his wounds appear better. We  have much better edema control in 4 layer compression on the left. This may be the factor that is allowing the left foot and left lateral calf to heal. He has external compression garments at home 04/14/20-All of his wounds are progressing well, the left forefoot is practically closed, left ischium appears to be about the same, right toe webspace is also smaller. The left lateral leg is about the same, continue using Hydrofera Blue to this, silver alginate to the ischium, Iodoflex to the toe space on the right 6/7; most of his wounds outside of the left buttock are doing well. The area on the left lateral calf and left dorsal foot  are smaller. The area on the right foot in between the first and second toe webspace is barely visible although he still says there is some drainage here is the only reason I did not heal this out. Unfortunately the area on the left buttock almost looks like he has [name] skin tear from tape. He has open wound and then [name] large flap of skin that we are trying to get adherence over an area just next to the remaining wound 6/21; 2 week follow-up. I believe is been here for nurse visits. Miraculously the area between his first and second toes on the left dorsal foot is closed over. Still open on the right first second web space. The left lateral calf has 2 open areas. Distally this is more superficial. The proximal area had [name] little more depth and required debridement of adherent necrotic material. His buttock wound is actually larger we have been using silver alginate here 6/28; the patient's area on the left foot remains closed. Still open wet area between the first and second toes on the right and also extending into the plantar aspect. We have been using silver alginate in this location. He has 2 areas on the left lower leg part of his original long wounds which I think are better. We have been using Hydrofera Blue here. Hydrofera Blue to the left buttock which is stable 7/12; left  foot remains closed. Left ankle is closed. May be [name] small area between his right first and second toes the only truly open area is on the left buttock. We have been using Hydrofera Blue here 7/19; patient arrives with marked deterioration especially in the left foot and ankle. We did not put him in [name] compression wrap on the left last week in fact he wore his juxta lite stockings on either side although he does not have an underlying stocking. He has [name] reopening on the left dorsal foot, left lateral ankle and [name] new area on the right dorsal ankle. More worrisome is the degree of erythema on the left foot extending on the lateral foot into the lateral lower leg on the left 7/26; the patient had erythema and drainage from the lateral left ankle last week. Culture of this grew MRSA resistant to doxycycline and clindamycin which are the 2 antibiotics we usually use with this patient who has multiple antibiotic allergies including linezolid, trimethoprim sulfamethoxazole. I had give him an empiric doxycycline and he comes in the area certainly looks somewhat better although it is blotchy in his lower leg. He has not been systemically unwell. He has had areas on the left dorsal foot which is [name] reopening, chronic wounds on the left lateral ankle. Both of these I think are secondary to chronic venous insufficiency. The area between his first and second toes is closed as far as I can tell. He had [name] new wrap injury on the right dorsal ankle last week. Finally he has an area on the left buttock. We have been using silver alginate to everything except the left buttock we are using Hydrofera Blue 06/30/20-Patient returns at 1 week, has been given [name] sample dose pack of NUZYRA which is [name] tetracycline derivative [omadacycline], patient has completed those, we have been using silver alginate to almost all the wounds except the left ischium where we are using Hydrofera Blue all of them look better 8/16; since I last saw the  patient he has been doing well. The area on the left buttock, left lateral ankle and  left foot are all closed today. He has completed the [place] I gave him last time and tolerated this well. He still has open areas on the right dorsal ankle and in the right first second toe area which we are using silver alginate. 8/23; we put him in his bilateral external compression stockings last week as he did not have anything open on either leg except for concerning area between the right first and second toe. He comes in today with an area on the left dorsal foot slightly more proximal than the original wound, the left lateral foot but this is actually [name] continuation of the area he had on the left lateral ankle from last time. As well he is opened up on the left buttock again. 8/30; comes in today with things looking [name] lot better. The area on the left lower ankle has closed down as has the left foot but with eschar in both areas. The area on the dorsal right ankle is also epithelialized. Very little remaining of the left buttock wound. We have been using silver alginate on all wound areas 9/13; the area in the first second toe webspace on the right has fully epithelialized. He still has some vulnerable epithelium on the right and the ankle and the dorsal foot. He notes weeping. He is using his juxta lite stocking. On the left again the left dorsal foot is closed left lateral ankle is closed. We went to the juxta lite stocking here as well. Still vulnerable in the left buttock although only 2 small open areas remain here 9/27; 2-week follow-up. We did not look at his left leg but the patient says everything is closed. He is [name] bit disturbed by the amount of edema in his left foot he is using juxta lite stockings but asking about over the toes stockings which would be 30/40, will talk to him next time. According to him there is no open wound on either the left foot or the left ankle/calf He has an open area on the  dorsal right calf which I initially point [name] wrap injury. He has superficial remaining wound on the left ischial tuberosity been using silver alginate although he says this sticks to the wound 10/5; we gave him 2-week follow-up but he called yesterday expressing some concerns about his right foot right ankle and the left buttock. He came in early. There is still no open areas on the left leg and that still in his juxta lite stocking 10/11; he only has 1 small area on the left buttock that remains measuring millimeters 1 mm. Still has the same irritated skin in this area. We recommended zinc oxide when this eventually closes and pressure relief is meticulously is he can do this. He still has an area on the dorsal part of his right first through third toes which is [name] bit irritated and still open and on the dorsal ankle near the crease of the ankle. We have been using silver alginate and using his own stocking. He has nothing open on the left leg or foot 10/25; 2-week follow-up. Not nearly as good on the left buttock as I was hoping. For open areas with 5 looking threatened small. He has the erythematous irritated chronic skin in this area. 1 area on the right dorsal ankle. He reports this area bleeds easily Right dorsal foot just proximal to the base of his toes We have been using silver alginate. 11/8; 2-week follow-up. Left buttock is about the same although I  do not think the wounds are in the same location we have been using silver alginate. I have asked him to use zinc oxide on the skin around the wounds. He still has [name] small area on the right dorsal ankle he reports this bleeds easily Right dorsal foot just proximal to the base of the toes does not have anything open although the skin is very dry and scaly He has [name] new opening on the nailbed of the left great toe. Nothing on the left ankle 11/29; 3-week follow-up. Left buttock has 2 open areas. And washing of these wounds today started bleeding  easily. Suggesting very friable tissue. We have been using silver alginate. Right dorsal ankle which I thought was initially [name] wrap injury we have been using silver alginate. Nothing open between the toes that I can see. He states the area on the left dorsal toe nailbed healed after the last visit in 2 or 3 days 12/13; 3-week follow-up. His left buttock now has 3 open areas but the original 2 areas are smaller using polymen here. Surrounding skin looks better. The right dorsal ankle is closed. He has [name] small opening on the right dorsal foot at the level of the third toe. In general the skin looks better here. He is wearing his juxta lite stocking on the left leg says there is nothing open 11/24/2020; 3 [name] follow-up. His left buttock still has the 3 open areas. We have been using polymen but due to lack of response he changed to [facility] area. Surrounding skin is dry erythematous and irritated looking. There is no evidence of infection either bacterial or fungal however there is loss of surface epithelium He still has very dry skin in his foot causing irritation and erythema on the dorsal part of his toes. This is not responded to prolonged courses of antifungal simply looks dry and irritated 1/24; left buttock area still looks about the same he was unable to find the triad ointment that we had suggested. The area on the right lower leg just above the dorsal ankle has reopened and the areas on the right foot between the first second and second third toes and scaling on the bottom of the foot has been about the same for quite some time now. been using silver alginate to all wound areas 2/7; left buttock wound looked quite good although not much smaller in terms of surface area surrounding skin looks better. Only [name] few dry flaking areas on the right foot in between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 area are both healed. He continues to have [name] deep wound with very adherent necrotic eschar and slough on the lateral aspect  of his left leg in 2 areas and also raised area over the left Achilles. We put Santyl on this last week and left him in [name] rapid. He says the drainage went through. He has some Kerlix Coban and in some Profore at home I have therefore written him [name] prescription for Santyl and he can change this at home on his own. 01/23/16; the original 2 wounds on the right leg are apparently still closed. He continues to have [name] deep wound on his left lateral leg in 2 spots the superior one much larger than the inferior one. He also has [name] raised area on the left Achilles. We have been putting Santyl and all of these wounds. His wife is changing this at home one time this week although she may be able to do this more frequently. 01/30/16 no open wounds on the right leg. He continues to have [name] deep wound on the left lateral leg in 2 spots and [name] smaller wound over the left Achilles area. [name] This debridement is with great difficulty. [name] 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 In spite of that his leg actually looks somewhat better. There is still copious drainage and some erythema but it is generally better. The oral options that were obvious including Zyvox and  sulfonamides he has rash issues both of these. This is sensitive to rifampin but this is not usually used along gentamicin but this is parenteral and again not used along. The obvious alternative is vancomycin. He has had his arterial studies. [name] T brachial index was 1.3 oe on the right. His waveforms were biphasic bilaterally. Doppler waveforms of the digit were normal in the right damp and on the left. Comment that this could've been due to extreme edema. His venous studies show reflux on both sides in the femoral popliteal veins as well as the greater and lesser saphenous veins bilaterally. Ultimately he is going to need to see vascular surgery about this issue. Hopefully when we can get his wounds and [name] little better shape. 02/19/16; the patient was able to complete [name] course of [facility]'s for MRSA in the face of multiple antibiotic allergies. Arterial studies showed an ABI of him 0.88 on the right 1.17 on the left the. Waveforms were biphasic at the posterior tibial and dorsalis pedis digital waveforms were normal. Right toe brachial index was 1.3 limited by shaking and edema. His venous study showed widespread reflux in the left at the common femoral vein the greater and lesser saphenous vein the greater and lesser saphenous vein on the right as well as the popliteal and femoral vein. The popliteal and femoral vein on the left did not show reflux. His wounds on the right leg give healed on the left he is still using Santyl. 02/26/16; patient completed [name] treatment with Dalvance for MRSA in the wound with associated erythema. The erythema has not really resolved and I wonder if this is mostly venous inflammation rather than cellulitis. Still using Santyl. He is approved for Apligraf 03/04/16; there is less erythema around the wound. Both wounds require aggressive surgical debridement. Not yet ready for  Apligraf 03/11/16; aggressive debridement again. Not ready  for Apligraf 03/18/16 aggressive debridement again. 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 Has been to see vascular surgery he is being planned for [name] venous ablation 03/25/16; aggressive debridement again of both wound areas on the left lateral leg. He is due for ablation surgery on May 22. He is much closer to being ready for an Apligraf. Has [name] new area between the left first and second toes 04/01/16 aggressive debridement done of both wounds. The new wound at the base of between his second and first toes looks stable 04/08/16; continued aggressive debridement of both wounds on the left lower leg. He goes for his venous ablation on Monday. The new wound at the base of his first and second toes dorsally appears stable. 04/15/16; wounds aggressively debridement although the base of this looks considerably better Apligraf #1. He had ablation surgery on Monday I'll need to research these records. We only have approval for four Apligraf's 04/22/16; the patient is here for [name] wound check [Apligraf last week] intake nurse concerned about erythema around the wounds. Apparently [name] significant degree of drainage. The patient has chronic venous inflammation which I think accounts for most of this however I was asked to look at this today 04/26/16; the patient came back for check of possible cellulitis in his left foot however the Apligraf dressing was inadvertently removed therefore we elected to prep the wound for [name] second Apligraf. I put him on doxycycline on 6/1 the erythema in the foot 05/03/16 we did not remove the dressing from the superior wound as this is where I put all of his last Apligraf. Surface debridement done with [name] curette of the lower wound which looks very healthy. The area on the left foot also looks quite satisfactory at the dorsal artery at the first and second toes 05/10/16; continue Apligraf to this. Her wound, Hydrafera to the lower wound. He has [name] new area on the right second toe. Left  dorsal foot firstoosecond toe also looks improved 05/24/16; wound dimensions must be smaller I was able to use Apligraf to all 3 remaining wound areas. 06/07/16 patient's last Apligraf was 2 [name] ago. He arrives today with the 2 wounds on his lateral left leg joined together. This would have to be seen as [name] negative. He also has [name] small wound in his first and second toe on the left dorsally with quite [name] bit of surrounding erythema in the first second and third toes. This looks to be infected or inflamed, very difficult clinical call. 06/21/16: lateral left leg combined wounds. Adherent surface slough area on the left dorsal foot at roughly the fourth toe looks improved 07/12/16; he now has [name] single linear wound on the lateral left leg. This does not look to be [name] lot changed from when I lost saw this. The area on his dorsal left foot looks considerably better however. 08/02/16; no major change in the substantial area on his left lateral leg since last time. We have been using Hydrofera Blue for [name] prolonged period of time now. The area on his left foot is also unchanged from last review 07/19/16; the area on his dorsal foot on the left looks considerably smaller. He is beginning to have significant rims of epithelialization on the lateral left leg wound. This also looks better. 08/05/16; the patient came in for [name] nurse visit today. Apparently the area on his left lateral leg looks better and it was wrapped. However in general discussion  the patient noted [name] new area on the dorsal aspect of his right second toe. The exact etiology of this is unclear but likely relates to pressure. 08/09/16 really the area on the left lateral leg did not really look that healthy today perhaps slightly larger and measurements. The area on his dorsal right second toe is improved also the left foot wound looks stable to improved 08/16/16; the area on the last lateral leg did not change any of dimensions. Post debridement with [name]  curet the area looked better. Left foot wound improved and the area on the dorsal right second toe is improved 08/23/16; the area on the left lateral leg may be slightly smaller both in terms of length and width. Aggressive debridement with [name] curette afterwards the tissue appears healthier. Left foot wound appears improved in the area on the dorsal right second toe is improved 08/30/16 patient developed [name] fever over the weekend and was seen in an urgent care. Felt to have [name] UTI and put on doxycycline. He has been since changed over the phone to [facility]. After we took off the wrap on his right leg today the leg is swollen warm and erythematous, probably more likely the source of the fever 09/06/16; have been using collagen to the major left leg wound, silver alginate to the area on his anterior foot/toes 09/13/16; the areas on his anterior foot/toes on both sides appear to be virtually closed. Extensive wound on the left lateral leg perhaps slightly narrower but each visit still covered an adherent surface slough 09/16/16 patient was in for his usual Thursday nurse visit however the intake nurse noted significant erythema of his dorsal right foot. He is also running [name] low- grade fever and having increasing spasms in the right leg 09/20/16 here for cellulitis involving his right great toes and forefoot. This is [name] lot better. Still requiring debridement on his left lateral leg. Santyl direct says he needs prior authorization. Therefore his wife cannot change this at home 09/30/16; the patient's extensive area on the left lateral calf and ankle perhaps somewhat better. Using Santyl. The area on the left toes is healed and I think the area on his right dorsal foot is healed as well. There is no cellulitis or venous inflammation involving the right leg. He is going to need compression stockings here. 10/07/16; the patient's extensive wound on the left lateral calf and ankle does not measure any differently  however there appears to be less adherent surface slough using Santyl and aggressive weekly debridements 10/21/16; no major change in the area on the left lateral calf. Still the same measurement still very difficult to debridement adherent slough and nonviable subcutaneous tissue. This is not really been helped by several [name] of Santyl. Previously for 2 [name] I used Iodoflex for [name] short period. [name] prolonged course of Hydrofera Blue didn't really help. I'[name] not sure why I only used 2 [name] of Iodoflex on this there is no evidence of surrounding infection. He has [name] small area on the right second toe which looks as though it's progressing towards closure 10/28/16; the wounds on his toes appear to be closed. No major change in the left lateral leg wound although the surface looks somewhat better using Iodoflex. He has had previous arterial studies that were normal. He has had reflux studies and is status post ablation although I don't have any exact notes on which vein was ablated. I'll need to check the surgical record 11/04/16; he's had [name] reopening between  the first and second toe on the left and right. No major change in the left lateral leg wound. There is what appears to be cellulitis of the left dorsal foot 11/18/16 the patient was hospitalized initially in [place] and then subsequently transferred to [facility] long and was admitted there from 11/09/16 through 11/12/16. He had developed progressive cellulitis on the right leg in spite of the doxycycline I gave him. I'd spoken to the hospitalist in [place] who was concerned about continuing leukocytosis. CT scan is what I suggested this was done which showed soft tissue swelling without evidence of osteomyelitis or an underlying abscess blood cultures were negative. At [facility] he was treated with vancomycin and Primaxin and then add an infectious disease consult. He was transitioned to Ceftaroline. He has been making progressive improvement. Overall  [name] severe cellulitis of the right leg. He is been using silver alginate to her original wound on the left leg. The wounds in his toes on the right are closed there is [name] small open area on the base of the left second toe 11/26/15; the patient's right leg is much better although there is still some edema here this could be reminiscent from his severe cellulitis likely on top of some degree of lymphedema. His left anterior leg wound has less surface slough as reported by her intake nurse. Small wound at the base of the left second toe 12/02/16; patient's right leg is better and there is no open wound here. His left anterior lateral leg wound continues to have [name] healthy-looking surface. Small wound at the base of the left second toe however there is erythema in the left forefoot which is worrisome 12/16/16; is no open wounds on his right leg. We took measurements for stockings. His left anterior lateral leg wound continues to have [name] healthy-looking surface. I'[name] not sure where we were with the Apligraf run through his insurance. We have been using Iodoflex. He has [name] thick eschar on the left first second toe interface, I suspect this may be fungal however there is no visible open 12/23/16; no open wound on his right leg. He has 2 small areas left of the linear wound that was remaining last week. We have been using Prisma, I thought I have disclosed this week, we can only look forward to next week 01/03/17; the patient had concerning areas of erythema last week, already on doxycycline for UTI through his primary doctor. The erythema is absolutely no better there is warmth and swelling both medially from the left lateral leg wound and also the dorsal left foot. 01/06/17- Patient is here for follow-up evaluation of his left lateral leg ulcer and bilateral feet ulcers. He is on oral antibiotic therapy, tolerating that. Nursing staff and the patient states that the erythema is improved from Monday. 01/13/17; the  predominant left lateral leg wound continues to be problematic. I had put Apligraf on him earlier this month once. However he subsequently developed what appeared to be an intense cellulitis around the left lateral leg wound. I gave him Dalvance I think on 2/12 perhaps 2/13 he continues on cefdinir. The erythema is still present but the warmth and swelling is improved. I am hopeful that the cellulitis part of this control. I wouldn't be surprised if there is an element of venous inflammation as well. 01/17/17. The erythema is present but better in the left leg. His left lateral leg wound still does not have [name] viable surface buttons certain parts of this long thin wound  it appears like there has been improvement in dimensions. 01/20/17; the erythema still present but much better in the left leg. I'[name] thinking this is his usual degree of chronic venous inflammation. The wound on the left leg looks somewhat better. Is less surface slough 01/27/17; erythema is back to the chronic venous inflammation. The wound on the left leg is somewhat better. I am back to the point where I like to try an Apligraf once again 02/10/17; slight improvement in wound dimensions. Apligraf #2. He is completing his doxycycline 02/14/17; patient arrives today having completed doxycycline last Thursday. This was supposed to be [name] nurse visit however once again he hasn't tense erythema from the medial part of his wound extending over the lower leg. Also erythema in his foot this is roughly in the same distribution as last time. He has baseline chronic venous inflammation however this is [name] lot worse than the baseline I have learned to accept the on him is baseline inflammation 02/24/17- patient is here for follow-up evaluation. He is tolerating compression therapy. His voicing no complaints or concerns he is here anticipating an Apligraf 03/03/17; he arrives today with an adherent necrotic surface. I don't think this is surface is going to be  amenable for Apligraf's. The erythema around his wound and on the left dorsal foot has resolved he is off antibiotics 03/10/17; better-looking surface today. I don't think he can tolerate Apligraf's. He tells me he had [name] wound VAC after [name] skin graft years ago to this area and they had difficulty with [name] seal. The erythema continues to be stable around this some degree of chronic venous inflammation but he also has recurrent cellulitis. We have been using Iodoflex 03/17/17; continued improvement in the surface and may be small changes in dimensions. Using Iodoflex which seems the only thing that will control his surface 03/24/17- He is here for follow up evaluation of his LLE lateral ulceration and ulcer to right dorsal foot/toe space. He is voicing no complaints or concerns, He is tolerating compression wrap. 03/31/17 arrives today with [name] much healthier looking wound on the left lower extremity. We have been using Iodoflex for [name] prolonged period of time which has for the first time prepared and adequate looking wound bed although we have not had much in the way of wound dimension improvement. He also has [name] small wound between the first and second toe on the right 04/07/17; arrives today with [name] healthy-looking wound bed and at least the top 50% of this wound appears to be now her. No debridement was required I have changed him to [facility] last week after prolonged Iodoflex. He did not do well with Apligraf's. We've had [name] re-opening between the first and second toe on the right 04/14/17; arrives today with [name] healthier looking wound bed contractions and the top 50% of this wound and some on the lesser 50%. Wound bed appears healthy. The area between the first and second toe on the right still remains problematic 04/21/17; continued very gradual improvement. Using [facility] 04/28/17; continued very gradual improvement in the left lateral leg venous insufficiency wound. His periwound erythema is very  mild. We have been using Hydrofera Blue. Wound is making progress especially in the superior 50% 05/05/17; he continues to have very gradual improvement in the left lateral venous insufficiency wound. Both in terms with an length rings are improving. I debrided this every 2 [name] with #5 curet and we have been using Hydrofera Blue and again making good  progress With regards to the wounds between his right first and second toe which I thought might of been tinea pedis he is not making as much progress very dry scaly skin over the area. Also the area at the base of the left first and second toe in [name] similar condition 05/12/17; continued gradual improvement in the refractory left lateral venous insufficiency wound on the left. Dimension smaller. Surface still requiring debridement using Hydrofera Blue 05/19/17; continued gradual improvement in the refractory left lateral venous ulceration. Careful inspection of the wound bed underlying rumination suggested some degree of epithelialization over the surface no debridement indicated. Continue Hydrofera Blue difficult areas between his toes first and third on the left than first and second on the right. I'[name] going to change to silver alginate from silver collagen. Continue ketoconazole as I suspect underlying tinea pedis 05/26/17; left lateral leg venous insufficiency wound. We've been using Hydrofera Blue. I believe that there is expanding epithelialization over the surface of the wound albeit not coming from the wound circumference. This is [name] bit of an odd situation in which the epithelialization seems to be coming from the surface of the wound rather than in the exact circumference. There is still small open areas mostly along the lateral margin of the wound. ooHe has unchanged areas between the left first and second and the right first second toes which I been treating for tenia pedis 06/02/17; left lateral leg venous insufficiency wound. We have been using  Hydrofera Blue. Somewhat smaller from the wound circumference. The surface of the wound remains [name] bit on it almost epithelialized sedation in appearance. I use an open curette today debridement in the surface of all of this especially the edges ooSmall open wounds remaining on the dorsal right first and second toe interspace and the plantar left first second toe and her face on the left 06/09/17; wound on the left lateral leg continues to be smaller but very gradual and very dry surface using Hydrofera Blue 06/16/17 requires weekly debridements now on the left lateral leg although this continues to contract. I changed to silver collagen last week because of dryness of the wound bed. Using Iodoflex to the areas on his first and second toes/web space bilaterally 06/24/17; patient with history of paraplegia also chronic venous insufficiency with lymphedema. Has [name] very difficult wound on the left lateral leg. This has been gradually reducing in terms of with but comes in with [name] very dry adherent surface. High switch to silver collagen [name] week or so ago with hydrogel to keep the area moist. This is been refractory to multiple dressing attempts. He also has areas in his first and second toes bilaterally in the anterior and posterior web space. I had been using Iodoflex here after [name] prolonged course of silver alginate with ketoconazole was ineffective [question tinea pedis] 07/14/17; patient arrives today with [name] very difficult adherent material over his left lateral lower leg wound. He also has surrounding erythema and poorly controlled edema. He was switched his Santyl last visit which the nurses are applying once during his doctor visit and once on [name] nurse visit. He was also reduced to 2 layer compression I'[name] not exactly sure of the issue here. 07/21/17; better surface today after 1 week of Iodoflex. Significant cellulitis that we treated last week also better. [Doxycycline] 07/28/17 better surface today with now  2 [name] of Iodoflex. Significant cellulitis treated with doxycycline. He has now completed the doxycycline and he is back to his usual degree  of chronic venous inflammation/stasis dermatitis. He reminds me he has had ablations surgery here 08/04/17; continued improvement with Iodoflex to the left lateral leg wound in terms of the surface of the wound although the dimensions are better. 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 Problematic areas on the plantar aspect of the first second toe web space on the left and the dorsal aspect of the first second toe web space on the right. At one point I felt these were probably related to chronic fungal infections in treated him aggressively for this although we have not made any improvement here. 08/11/17; left lateral leg. Surface continues to improve with the Iodoflex although we are not seeing much improvement in overall wound dimensions. Areas on his plantar left foot and right foot show no improvement. In fact the right foot looks somewhat worse 08/18/17; left lateral leg. We changed to [facility] Blue last week after [name] prolonged course of Iodoflex which helps get the surface better. It appears that the wound with is improved. Continue with difficult areas on the left dorsal first second and plantar first second on the right 09/01/17; patient arrives in clinic today having had [name] temperature of 103 yesterday. He was seen in the ER and [facility] [facility] The patient was concerned he could have cellulitis again in the right leg however they diagnosed him with [name] UTI and he is now on Keflex. He has [name] history of cellulitis which is been recurrent and difficult but this is been in the left leg, in the past 5 use doxycycline. He does in and out catheterizations at home which are risk factors for UTI 09/08/17; patient will be completing his Keflex this weekend. The erythema on the left leg is considerably  better. He has [name] new wound today on the medial part of the right leg small superficial almost looks like [name] skin tear. He has worsening of the area on the right dorsal first and second toe. His major area on the left lateral leg is better. Using Hydrofera Blue on all areas 09/15/17; gradual reduction in width on the long wound in the left lateral leg. No debridement required. He also has wounds on the plantar aspect of his left first second toe web space and on the dorsal aspect of the right first second toe web space. 09/22/17; there continues to be very gradual improvements in the dimensions of the left lateral leg wound. He hasn't round erythematous spot with might be pressure on his wheelchair. There is no evidence obviously of infection no purulence no warmth ooHe has [name] dry scaled area on the plantar aspect of the left first second toe ooImproved area on the dorsal right first second toe. 09/29/17; left lateral leg wound continues to improve in dimensions mostly with an is still [name] fairly long but increasingly narrow wound. ooHe has [name] dry scaled area on the plantar aspect of his left first second toe web space ooIncreasingly concerning area on the dorsal right first second toe. In fact I am concerned today about possible cellulitis around this wound. The areas extending up his second toe and although there is deformities here almost appears to abut on the nailbed. 10/06/17; left lateral leg wound continues to make very gradual progress. Tissue culture I did from the right first second toe dorsal foot last time grew MRSA and enterococcus which was vancomycin sensitive. This was not sensitive to clindamycin or doxycycline. He is allergic to Zyvox and  sulfa we have therefore arrange for him to have dalvance infusion tomorrow. He is had this in the past and tolerated it well 10/20/17; left lateral leg wound continues to make decent progress. This is certainly reduced in terms of with there is  advancing epithelialization.ooThe cellulitis in the right foot looks better although he still has [name] deep wound in the dorsal aspect of the first second toe web space. Plantar left first toe web space on the left I think is making some progress 10/27/17; left lateral leg wound continues to make decent progress. Advancing epithelialization.using Hydrofera Blue ooThe right first second toe web space wound is better-looking using silver alginate ooImprovement in the left plantar first second toe web space. Again using silver alginate 11/03/17 left lateral leg wound continues to make decent progress albeit slowly. Using Hydrofera Blue ooThe right per second toe web space continues to be [name] very problematic looking punched out wound. I obtained [name] piece of tissue for deep culture I did extensively treated this for fungus. It is difficult to imagine that this is [name] pressure area as the patient states other than going outside he doesn't really wear shoes at home ooThe left plantar first second toe web space looked fairly senescent. Necrotic edges. This required debridement oochange to Hydrofera Blue to all wound areas 11/10/17; left lateral leg wound continues to contract. Using Hydrofera Blue ooOn the right dorsal first second toe web space dorsally. Culture I did of this area last week grew MRSA there is not an easy oral option in this patient was multiple antibiotic allergies or intolerances. This was only [name] rare culture isolate I'[name] therefore going to use Bactroban under silver alginate ooOn the left plantar first second toe web space. Debridement is required here. This is also unchanged 11/17/17; left lateral leg wound continues to contract using Hydrofera Blue this is no longer the major issue. ooThe major concern here is the right first second toe web space. He now has an open area going from dorsally to the plantar aspect. There is now wound on the inner lateral part of the first toe. Not [name]  very viable surface on this. There is erythema spreading medially into the forefoot. ooNo major change in the left first second toe plantar wound 11/24/17; left lateral leg wound continues to contract using Hydrofera Blue. Nice improvement today ooThe right first second toe web space all of this looks [name] lot less angry than last week. I have given him clindamycin and topical Bactroban for MRSA and terbinafine for the possibility of underlining tinea pedis that I could not control with ketoconazole. Looks somewhat better ooThe area on the plantar left first second toe web space is weeping with dried debris around the wound 12/01/17; left lateral leg wound continues to contract he Hydrofera Blue. It is becoming thinner in terms of with nevertheless it is making good improvement. ooThe right first second toe web space looks less angry but still [name] large necrotic-looking wounds starting on the plantar aspect of the right foot extending between the toes and now extensively on the base of the right second toe. I gave him clindamycin and topical Bactroban for MRSA anterior benefiting for the possibility of underlying tinea pedis. Not looking better today ooThe area on the left first/second toe looks better. Debrided of necrotic debris 12/05/17* the patient was worked in urgently today because over the weekend he found blood on his incontinence bad when he woke up. He was found to have an ulcer by his  wife who does most of his wound care. He came in today for [place] to look at this. He has not had [name] history of wounds in his buttocks in spite of his paraplegia. 12/08/17; seen in follow-up today at his usual appointment. He was seen earlier this week and found to have [name] new wound on his buttock. We also follow him for wounds on the left lateral leg, left first second toe web space and right first second toe web space 12/15/17; we have been using Hydrofera Blue to the left lateral leg which has improved. The right  first second toe web space has also improved. Left first second toe web space plantar aspect looks stable. The left buttock has worsened using Santyl. Apparently the buttock has drainage 12/22/17; we have been using Hydrofera Blue to the left lateral leg which continues to improve now 2 small wounds separated by normal skin. He tells [place] he had [name] fever up to 100 yesterday he is prone to UTIs but has not noted anything different. He does in and out catheterizations. The area between the first and second toes today does not look good necrotic surface covered with what looks to be purulent drainage and erythema extending into the third toe. I had gotten this to something that I thought look better last time however it is not look good today. He also has [name] necrotic surface over the buttock wound which is expanded. I thought there might be infection under here so I removed [name] lot of the surface with [name] #5 curet though nothing look like it really needed culturing. He is been using Santyl to this area 12/27/17; his original wound on the left lateral leg continues to improve using Hydrofera Blue. I gave him samples of Baxdella although he was unable to take them out of fear for an allergic reaction ["lump in his throat"].the culture I did of the purulent drainage from his second toe last week showed both enterococcus and [name] set Enterobacter I was also concerned about the erythema on the bottom of his foot although paradoxically although this looks somewhat better today. 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 We've been using silver alginate here. They came up today that he sleeps in [name] recliner, I'[name] not sure why but I asked him to stop this 01/03/18; his original wound we've been using Hydrofera Blue is now separated into 2 areas. ooUlcer on his left buttock is better he is off the recliner and sleeping in bed ooFinally  both wound areas between his first and second toes also looks some better 01/10/18; his original wound on the left lateral leg is now separated into 2 wounds we've been using Hydrofera Blue ooUlcer on his left buttock has some drainage. There is [name] small probing site going into muscle layer superiorly.using silver alginate -He arrives today with [name] deep tissue injury on the left heel ooThe wound on the dorsal aspect of his first second toe on the left looks [name] lot betterusing silver alginate ketoconazole ooThe area on the first second toe web space on the right also looks [name] lot bette 01/17/18; his original wound on the left lateral leg continues to progress using Hydrofera Blue ooUlcer on his left buttock also is smaller surface healthier except for [name] small probing site going into the muscle layer superiorly. 2.4 cm of tunneling in this area ooDTI on his left heel we have only  been offloading. Looks better than last week no threatened open no evidence of infection oothe wound on the dorsal aspect of the first second toe on the left continues to look like it's regressing we have only been using silver alginate and terbinafine orally ooThe area in the first second toe web space on the right also looks to be [name] lot better using silver alginate and terbinafine I think this was prompted by tinea pedis 01/31/18; the patient was hospitalized in [place] last week apparently for [name] complicated UTI. He was discharged on cefepime he does in and out catheterizations. In the hospital he was discovered [name] I don't mild elevation of AST and ALT and the terbinafine was stopped.predictably the pressure ulcer on s his buttock looks betterusing silver alginate. The area on the left lateral leg also is better using Hydrofera Blue. The area between the first and second toes on the left better. First and second toes on the right still substantial but better. Finally the DTI on the left heel has held together and looks like  it's resolving 02/07/18-he is here in follow-up evaluation for multiple ulcerations. He has new injury to the lateral aspect of the last issue [name] pressure ulcer, he states this is from adhesive removal trauma. He states he has tried multiple adhesive products with no success. All other ulcers appear stable. The left heel DTI is resolving. We will continue with same treatment plan and follow-up next week. 02/14/18; follow-up for multiple areas. ooHe has [name] new area last week on the lateral aspect of his pressure ulcer more over the posterior trochanter. The original pressure ulcer looks quite stable has healthy granulation. 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 Almost fully epithelialized on the original superior area using Hydrofera Blue ooDTI on the left heel has peeled off this week to reveal [name] small superficial wound under denuded skin and subcutaneous tissue ooBoth areas between the first and second toes look better including nothing open on the left 02/21/18; ooThe patient's wounds on his left ischial tuberosity and posterior left greater trochanter actually looked better. He has [name] large area of irritation around the area which I think is contact dermatitis. I am doubtful that this is fungal ooHis original wound on the left lateral calf continues to improve we have been using Hydrofera Blue ooThere is no open area in the left first second toe web space although there is [name] lot of thick callus ooThe DTI on the left heel required debridement today of necrotic surface eschar and subcutaneous tissue using silver alginate ooFinally the area on the right first second toe webspace continues to contract using silver alginate and ketoconazole 02/28/18 ooLeft ischial tuberosity wounds look better using silver alginate. ooOriginal wound on the left calf only has one small open area left  using Hydrofera Blue ooDTI on the left heel required debridement mostly removing skin from around this wound surface. Using silver alginate ooThe areas on the right first/second toe web space using silver alginate and ketoconazole 03/08/18 on evaluation today patient appears to be doing decently well as best I can tell in regard to his wounds. This is the first time that I have seen him as he generally is followed by Dr. Dellia Nims. With that being said none of his wounds appear to be infected he does have an area where there is some skin covering what appears to be [name] new wound on  the left dorsal surface of his great toe. This is right at the nail bed. With that being said I do believe that debrided away some of the excess skin can be of benefit in this regard. Otherwise he has been tolerating the dressing changes without complication. 03/14/18; patient arrives today with the multiplicity of wounds that we are following. He has not been systemically unwell ooOriginal wound on the left lateral calf now only has 2 small open areas we've been using Hydrofera Blue which should continue ooThe deep tissue injury on the left heel requires debridement today. We've been using silver alginate ooThe left first second toe and the right first second toe are both are reminiscence what I think was tinea pedis. Apparently some of the callus Surface between the toes was removed last week when it started draining. ooPurulent drainage coming from the wound on the ischial tuberosity on the left. 03/21/18-He is here in follow-up evaluation for multiple wounds. 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 He tolerated debridement. The only change to last week's recommendations is to discontinue antifungal cream between toes. He will follow-up next week 03/28/18; following up for multiple wounds;Concern this week is streaking redness and swelling in the  right foot. He is going to need antibiotics for this. 03/31/18; follow-up for right foot cellulitis. Streaking redness and swelling in the right foot on 03/28/18. He has multiple antibiotic intolerances and [name] history of MRSA. I put him on clindamycin 300 mg every 6 and brought him in for [name] quick check. He has an open wound between his first and second toes on the right foot as [name] potential source. 04/04/18; ooRight foot cellulitis is resolving he is completing clindamycin. This is truly good news ooLeft lateral calf wound which is initial wound only has one small open area inferiorly this is close to healing out. He has compression stockings. We will use Hydrofera Blue right down to the epithelialization of this ooNonviable surface on the left heel which was initially pressure with [name] DTI. We've been using Hydrofera Blue. I'[name] going to switch this back to silver alginate ooLeft first second toe/tinea pedis this looks better using silver alginate ooRight first second toe tinea pedis using silver alginate ooLarge pressure ulcers on theLeft ischial tuberosity. Small wound here Looks better. I am uncertain about the surface over the large wound. Using silver alginate 04/11/18; ooCellulitis in the right foot is resolved ooLeft lateral calf wound which was his original wounds still has 2 tiny open areas remaining this is just about closed ooNonviable surface on the left heel is better but still requires debridement ooLeft first second toe/tinea pedis still open using silver alginate ooRight first second toe wound tinea pedis I asked him to go back to using ketoconazole and silver alginate ooLarge pressure ulcers on the left ischial tuberosity this shear injury here is resolved. Wound is smaller. No evidence of infection using silver alginate 04/18/18; ooPatient arrives with an intense area of cellulitis in the right mid lower calf extending into the right heel area. Bright red and warm. Smaller  area on the left anterior leg. He has [name] significant history of MRSA. He will definitely need antibioticsoodoxycycline ooHe now has 2 open areas on the left ischial tuberosity the original large wound and now [name] satellite area which I think was above his initial satellite areas. Not [name] wonderful surface on this satellite area surrounding erythema which looks like pressure related. ooHis left lateral  calf wound again his original wound is just about closed ooLeft heel pressure injury still requiring debridement ooLeft first second toe looks [name] lot better using silver alginate ooRight first second toe also using silver alginate and ketoconazole cream also looks better 04/20/18; the patient was worked in early today out of concerns with his cellulitis on the right leg. I had started him on doxycycline. This was 2 days ago. His wife was concerned about the swelling in the area. Also concerned about the left buttock. He has not been systemically unwell no fever chills. No nausea vomiting or diarrhea 04/25/18; the patient's left buttock wound is continued to deteriorate he is using Hydrofera Blue. He is still completing clindamycin for the cellulitis on the right leg although all of this looks better. 05/02/18 ooLeft buttock wound still with [name] lot of drainage and [name] very tightly adherent fibrinous necrotic surface. He has [name] deeper area superiorly ooThe left lateral calf wound is still closed ooDTI wound on the left heel necrotic surface especially the circumference using Iodoflex ooAreas between his left first second toe and right first second toe both look better. Dorsally and the right first second toe he had [name] necrotic surface although at smaller. In using silver alginate and ketoconazole. I did [name] culture last week which was [name] deep tissue culture of the reminiscence of the open wound on the right first second toe dorsally. This grew [name] few Acinetobacter and [name] few methicillin-resistant staph aureus.  Nevertheless the area actually this week looked better. I didn't feel the need to specifically address this at least in terms of systemic antibiotics. 05/09/18; wounds are measuring larger more drainage per our intake. We are using Santyl covered with alginate on the large superficial buttock wounds, Iodosorb on the left heel, ketoconazole and silver alginate to the dorsal first and second toes bilaterally. 05/16/18; ooThe area on his left buttock better in some aspects although the area superiorly over the ischial tuberosity required an extensive debridement.using Santyl ooLeft heel appears stable. Using Iodoflex ooThe areas between his first and second toes are not bad however there is spreading erythema up the dorsal aspect of his left foot this looks like cellulitis again. He is insensate the erythema is really very brilliant.o Erysipelas He went to see an allergist days ago because he was itching part of this he had lab work done. This showed [name] white count of 15.1 with 70% neutrophils. Hemoglobin of 11.4 and [name] platelet count of 659,000. Last white count we had in [facility] was [name] 2-1/2 years ago which was 25.9 but he was ill at the time. He was able to show me some lab work that was done by his primary physician the pattern is about the same. 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 But prompted me to go ahead and do x-rays of both feet and the pelvis rule out osteomyelitis. He also had [name] comprehensive metabolic panel this was reasonably normal his albumin was 3.7 liver function tests BUN/creatinine all normal 05/23/18; x-rays of both his feet from last week were negative for underlying pulmonary abnormality. The x-ray of his pelvis however showed mild irregularity in the left ischial which may represent some early osteomyelitis. The wound in the left ischial continues to get deeper clearly now exposed muscle. Each week necrotic surface material over this  area. Whereas the rest of the wounds do not look so bad. ooThe left ischial wound we have been using  Santyl and calcium alginate ooT the left heel surface necrotic debris using Iodoflex o ooThe left lateral leg is still healed ooAreas on the left dorsal foot and the right dorsal foot are about the same. There is some inflammation on the left which might represent contact dermatitis, fungal dermatitis I am doubtful cellulitis although this looks better than last week 05/30/18; CT scan done at Hospital did not show any osteomyelitis or abscess. Suggested the possibility of underlying cellulitis although I don't see [name] lot of evidence of this at the bedside ooThe wound itself on the left buttock/upper thigh actually looks somewhat better. No debridement ooLeft heel also looks better no debridement continue Iodoflex ooBoth dorsal first second toe spaces appear better using Lotrisone. Left still required debridement 06/06/18; ooIntake reported some purulent looking drainage from the left gluteal wound. Using Santyl and calcium alginate ooLeft heel looks better although still [name] nonviable surface requiring debridement ooThe left dorsal foot first/second webspace actually expanding and somewhat deeper. I may consider doing [name] shave biopsy of this area ooRight dorsal foot first/second webspace appears stable to improved. Using Lotrisone and silver alginate to both these areas 06/13/18 ooLeft gluteal surface looks better. Now separated in the 2 wounds. No debridement required. Still drainage. We'll continue silver alginate ooLeft heel continues to look better with Iodoflex continue this for at least another week ooOf his dorsal foot wounds the area on the left still has some depth although it looks better than last week. We've been using Lotrisone and silver alginate 06/20/18 ooLeft gluteal continues to look better healthy tissue ooLeft heel continues to look better healthy granulation wound is  smaller. He is using Iodoflex and his long as this continues continue the Iodoflex ooDorsal right foot looks better unfortunately dorsal left foot does not. There is swelling and erythema of his forefoot. He had minor trauma to this several days ago but doesn't think this was enough to have caused any tissue injury. Foot looks like cellulitis, we have had this problem before 06/27/18 on evaluation today patient appears to be doing [name] little worse in regard to his foot ulcer. 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 Both of which would really be his only options for treating this infection. In the past he has been given and effusion of Orbactiv. This is done very well for him in the past again it's one time dosing IV antibiotic therapy. Subsequently I do believe this is something we're gonna need to see about doing at this point in time. Currently his other wounds seem to be doing somewhat better in my pinion I'[name] pretty happy in that regard. 07/03/18 on evaluation today patient's wounds actually appear to be doing fairly well. He has been tolerating the dressing changes without complication. All in all he seems to be showing signs of improvement. In regard to the antibiotics he has been dealing with infectious disease since I saw him last week as far as getting this scheduled. In the end he's going to be going [facility] done this coming Friday. In the meantime he has been continuing to perform the dressing changes in such as previous. There does not appear to be any evidence of infection worsengin at this time. 07/10/18; ooSince I last saw this man 2 [name] ago things have actually improved. IV antibiotics of resulted  in less forefoot erythema although there is still some present. He is not systemically unwell ooLeft buttock  wounds o2 now have no depth there is increased epithelialization Using silver alginate ooLeft heel still requires debridement using Iodoflex ooLeft dorsal foot still with [name] sizable wound about the size of [name] border but healthy granulation ooRight dorsal foot still with [name] slitlike area using silver alginate 07/18/18; the patient's cellulitis in the left foot is improved in fact I think it is on its way to resolving. ooLeft buttock wounds o2 both look better although the larger one has hypertension granulation we've been using silver alginate ooLeft heel has some thick circumferential redundant skin over the wound edge which will need to be removed today we've been using Iodoflex ooLeft dorsal foot is still [name] sizable wound required debridement using silver alginate ooThe right dorsal foot is just about closed only [name] small open area remains here 07/25/18; left foot cellulitis is resolved ooLeft buttock wounds o2 both look better. Hyper-granulation on the major area ooLeft heel as some debris over the surface but otherwise looks [name] healthier wound. Using silver collagen ooRight dorsal foot is just about closed 07/31/18; arrives with our intake nurse worried about purulent drainage from the buttock. We had hyper-granulation here last week ooHis buttock wounds o2 continue to look better ooLeft heel some debris over the surface but measuring smaller. ooRight dorsal foot unfortunately has openings between the toes ooLeft foot superficial wound looks less aggravated. 08/07/18 ooButtock wounds continue to look better although some of her granulation and the larger medial wound. silver alginate ooLeft heel continues to look [name] lot better.silver collagen ooLeft foot superficial wound looks less stable. Requires debridement. He has [name] new wound superficial area on the foot on the lateral dorsal foot. ooRight foot looks better using silver alginate without Lotrisone 08/14/2018; patient was in the  ER last week diagnosed with [name] UTI. He is now on Cefpodoxime and Macrodantin. ooButtock wounds continued to be smaller. Using silver alginate ooLeft heel continues to look better using silver collagen ooLeft foot superficial wound looks as though it is improving ooRight dorsal foot area is just about healed. 08/21/2018; patient is completed his antibiotics for his UTI. ooHe has 2 open areas on the buttocks. There is still not closed although the surface looks satisfactory. Using silver alginate ooLeft heel continues to improve using silver collagen ooThe bilateral dorsal foot areas which are at the base of his first and second toes/possible tinea pedis are actually stable on the left but worse on the right. The area on the left required debridement of necrotic surface. After debridement I obtained [name] specimen for PCR culture. ooThe right dorsal foot which is been just about healed last week is now reopened 08/28/2018; culture done on the left dorsal foot showed coag negative staph both staph epidermidis and Lugdunensis. I think this is worthwhile initiating systemic treatment. I will use doxycycline given his long list of allergies. The area on the left heel slightly improved but still requiring debridement. ooThe large wound on the buttock is just about closed whereas the smaller one is larger. Using silver alginate in this area 09/04/2018; patient is completing his doxycycline for the left foot although this continues to be [name] very difficult wound area with very adherent necrotic debris. We are using silver alginate to all his wounds right foot left foot and the small wounds on his buttock, silver collagen on the left heel. 09/11/2018; once again this patient has intense erythema and swelling  of the left forefoot. Lesser degrees of erythema in the right foot. He has [name] long list of allergies and intolerances. I will reinstitute doxycycline. oo2 small areas on the left buttock are all the  left of his major stage III pressure ulcer. Using silver alginate ooLeft heel also looks better using silver collagen ooUnfortunately both the areas on his feet look worse. The area on the left first second webspace is now gone through to the plantar part of his foot. The area on the left foot anteriorly is irritated with erythema and swelling in the forefoot. 09/25/2018 ooHis wound on the left plantar heel looks better. Using silver collagen ooThe area on the left buttock 2 small remnant areas. One is closed one is still open. Using silver alginate ooThe areas between both his first and second toes look worse. This in spite of long-standing antifungal therapy with ketoconazole and silver alginate which should have antifungal activity ooHe has small areas around his original wound on the left calf one is on the bottom of the original scar tissue and one superiorly both of these are small and superficial but again given wound history in this site this is worrisome 10/02/2018 ooLeft plantar heel continues to gradually contract using silver collagen ooLeft buttock wound is unchanged using silver alginate ooThe areas on his dorsal feet between his first and second toes bilaterally look about the same. 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 Small open area here. We have been using silver alginate 10/09/2018; ooLeft plantar heel continues to look healthy and contract. No debridement is required ooLeft buttock slightly smaller with [name] tape injury wound just below which was new this week ooDorsal feet somewhat improved I have been using clindamycin ooLeft lateral looks lower extremity the actual open area looks worse although [name] lot of this is epithelialized. I am going to change to silver collagen today He has  [name] lot more swelling in the right leg although this is not pitting not red and not particularly warm there is [name] lot of spasm in the right leg usually indicative of people with paralysis of some underlying discomfort. We have reviewed his vascular status from 2017 he had [name] left greater saphenous vein ablation. I wonder about referring him back to vascular surgery if the area on the left leg continues to deteriorate. 10/16/2018 in today for follow-up and management of multiple lower extremity ulcers. His left Buttock wound is much lower smaller and almost closed completely. The wound to the left ankle has began to reopen with Epithelialization and some adherent slough. He has multiple new areas to the left foot and leg. The left dorsal foot without much improvement. Wound present between left great webspace and 2nd toe. Erythema and edema present right leg. Right LE ultrasound obtained on 10/10/18 was negative for DVT . 10/23/2018; ooLeft buttock is closed over. Still dry macerated skin but there is no open wound. I suspect this is chronic pressure/moisture ooLeft lateral calf is quite [name] bit worse than when I saw this last. There is clearly drainage here he has macerated skin into the left plantar heel. We will change the primary dressing to alginate ooLeft dorsal foot has some improvement in overall wound area. Still using clindamycin and silver alginate ooRight dorsal foot about the same as the left using clindamycin and silver alginate ooThe  erythema in the right leg has resolved. He is DVT rule out was negative ooLeft heel pressure area required debridement although the wound is smaller and the surface is health 10/26/2018 ooThe patient came back in for his nurse check today predominantly because of the drainage coming out of the left lateral leg with [name] recent reopening of his original wound on the left lateral calf. He comes in today with [name] large amount of surrounding erythema around the  wound extending from the calf into the ankle and even in the area on the dorsal foot. He is not systemically unwell. He is not febrile. Nevertheless this looks like cellulitis. We have been using silver alginate to the area. I changed him to [name] regular visit and I am going to prescribe him doxycycline. The rationale here is [name] long list of medication intolerances and [name] history of MRSA. I did not see anything that I thought would provide [name] valuable culture 10/30/2018 ooFollow-up from his appointment 4 days ago with really an extensive area of cellulitis in the left calf left lateral ankle and left dorsal foot. I put him on doxycycline. He has [name] long list of medication allergies which are true allergy reactions. 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 In any case he is [name] lot better today. The erythema especially in the anterior and lateral left calf is better. He still has left ankle erythema. He also is complaining about increasing edema in the right leg we have only been using Kerlix Coban and he has been doing the wraps at home. Finally he has [name] spotty rash on the medial part of his upper left calf which looks like folliculitis or perhaps wrap occlusion type injury. Small superficial macules not pustules 11/06/18 patient arrives today with again [name] considerable degree of erythema around the wound on the left lateral calf extending into the dorsal ankle and dorsal foot. This is [name] lot worse than when I saw this last week. He is on doxycycline really with not [name] lot of improvement. He has not been systemically unwell Wounds on the; left heel actually looks improved. Original area on the left foot and proximity to the first and second toes looks about the same. He has superficial areas on the dorsal foot, anterior calf and then the reopening of his original wound on the left lateral calf which looks about the same ooThe only area he has  on the right is the dorsal webspace first and second which is smaller. ooHe has [name] large area of dry erythematous skin on the left buttock small open area here. 11/13/2018; the patient arrives in much better condition. The erythema around the wound on the left lateral calf is [name] lot better. Not sure whether this was the clindamycin or the TCA and ketoconazole or just in the improvement in edema control [stasis dermatitis]. In any case this is [name] lot better. The area on the left heel is very small and just about resolved using silver collagen we have been using silver alginate to the areas on his dorsal feet 11/20/2018; his wounds include the left lateral calf, left heel, dorsal aspects of both feet just proximal to the first second webspace. He is stable to slightly improved. I did not think any changes to his dressings were going to be necessary 11/27/2018 he has [name] reopening on the left buttock which is surrounded by what looks like tinea or perhaps some other form  of dermatitis. The area on the left dorsal foot has some erythema around it I have marked this area but I am not sure whether this is cellulitis or not. Left heel is not closed. Left calf the reopening is really slightly longer and probably worse 1/13; in general things look better and smaller except for the left dorsal foot. Area on the left heel is just about closed, left buttock looks better only [name] small wound remains in the skin looks better [using Lotrisone] 1/20; the area on the left heel only has [name] few remaining open areas here. Left lateral calf about the same in terms of size, left dorsal foot slightly larger right lateral foot still not closed. The area on the left buttock has no open wound and the surrounding skin looks [name] lot better 1/27; the area on the left heel is closed. Left lateral calf better but still requiring extensive debridements. The area on his left buttock is closed. He still has the open areas on the left dorsal foot  which is slightly smaller in the right foot which is slightly expanded. We have been using Iodoflex on these areas as well 2/3; left heel is closed. Left lateral calf still requiring debridement using Iodoflex there is no open area on his left buttock however he has dry scaly skin over [name] large area of this. Not really responding well to the Lotrisone. Finally the areas on his dorsal feet at the level of the first second webspace are slightly smaller on the right and about the same on the left. Both of these vigorously debrided with Anasept and gauze 2/10; left heel remains closed he has dry erythematous skin over the left buttock but there is no open wound here. Left lateral leg has come in and with. Still requiring debridement we have been using Iodoflex here. Finally the area on the left dorsal foot and right dorsal foot are really about the same extremely dry callused fissured areas. He does not yet have [name] dermatology appointment 2/17; left heel remains closed. He has [name] new open area on the left buttock. The area on the left lateral calf is bigger longer and still covered in necrotic debris. No major change in his foot areas bilaterally. I am awaiting for [name] dermatologist to look on this. We have been using ketoconazole I do not know that this is been doing any good at all. 2/24; left heel remains closed. The left buttock wound that was new reopening last week looks better. The left lateral calf appears better also although still requires debridement. The major area on his foot is the left first second also requiring debridement. We have been putting Prisma on all wounds. I do not believe that the ketoconazole has done too much good for his feet. He will use Lotrisone I am going to give him [name] 2-week course of terbinafine. We still do not have [name] dermatology appointment 3/2 left heel remains closed however there is skin over bone in this area I pointed this out to him today. The left buttock wound is  epithelialized but still does not look completely stable. The area on the left leg required debridement were using silver collagen here. With regards to his feet we changed to Lotrisone last week and silver alginate. 3/9; left heel remains closed. Left buttock remains closed. The area on the right foot is essentially closed. The left foot remains unchanged. Slightly smaller on the left lateral calf. Using silver collagen to both of these areas  3/16-Left heel remains closed. Area on right foot is closed. Left lateral calf above the lateral malleolus open wound requiring debridement with easy bleeding. Left dorsal wound proximal to first toe also debrided. Left ischial area open new. Patient has been using Prisma with wrapping every 3 days. Dermatology appointment is apparently tomorrow.Patient has completed his terbinafine 2-week course with some apparent improvement according to him, there is still flaking and dry skin in his foot on the left 3/23; area on the right foot is reopened. The area on the left anterior foot is about the same still [name] very necrotic adherent surface. He still has the area on the left leg and reopening is on the left buttock. He apparently saw dermatology although I do not have [name] note. According to the patient who is usually fairly well informed they did not have any good ideas. Put him on oral terbinafine which she is been on before. 3/30; using silver collagen to all wounds. Apparently his dermatologist put him on doxycycline and rifampin presumably some culture grew staph. I do not have this result. He remains on terbinafine although I have used terbinafine on him before 4/6; patient has had [name] fairly substantial reopening on the right foot between the first and second toes. He is finished his terbinafine and I believe is on doxycycline and rifampin still as prescribed by dermatology. We have been using silver collagen to all his wounds although the patient reports that he  thinks silver alginate does better on the wounds on his buttock. 4/13; the area on his left lateral calf about the same size but it did not require debridement. ooLeft dorsal foot just proximal to the webspace between the first and second toes is about the same. Still nonviable surface. I note some superficial bronze discoloration of the dorsal part of his foot ooRight dorsal foot just proximal to the first and second toes also looks about the same. I still think there may be the same discoloration I noted above on the left ooLeft buttock wound looks about the same 4/20; left lateral calf appears to be gradually contracting using silver collagen. 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 The left dorsal foot wound is debrided of tightly adherent necrotic debris and really cleans up quite nicely. The right area is worse with expansion. I did not debride this it is now over the base of the second toe ooThe area on his left buttock is smaller no debridement is required using silver collagen 5/4; left calf continues to make good progress. ooHe arrives with erythema around the wounds on his dorsal foot which even extends to the plantar aspect. Very concerning for coexistent infection. He is finished the erythromycin I gave him for possible erythrasma this does not seem to have helped. ooThe area on the left foot is about the same base of the dorsal toes ooIs area on the buttock looks improved on the left 5/11; left calf and left buttock continued to make good progress. Left foot is about the same to slightly improved. ooMajor problem is on the right foot. He has not had an x-ray. Deep tissue culture I did last week showed both Enterobacter and E. coli. I did not change the doxycycline I put him on empirically although neither 1 of these were plated to doxycycline. He arrives today with the erythema looking worse on both the dorsal and  plantar foot. Macerated skin on the bottom of the foot. he has not been  systemically unwell 5/18-Patient returns at 1 week, left calf wound appears to be making some progress, left buttock wound appears slightly worse than last time, left foot wound looks slightly better, right foot redness is marginally better. X-ray of both feet show no air or evidence of osteomyelitis. Patient is finished his Omnicef and terbinafine. He continues to have macerated skin on the bottom of the left foot as well as right 5/26; left calf wound is better, left buttock wound appears to have multiple small superficial open areas with surrounding macerated skin. X-rays that I did last time showed no evidence of osteomyelitis in either foot. He is finished cefdinir and doxycycline. I do not think that he was on terbinafine. He continues to have [name] large superficial open area on the right foot anterior dorsal and slightly between the first and second toes. I did send him to dermatology 2 months ago or so wondering about whether they would do [name] fungal scraping. I do not believe they did but did do [name] culture. We have been using silver alginate to the toe areas, he has been using antifungals at home topically either ketoconazole or Lotrisone. We are using silver collagen on the left foot, silver alginate on the right, silver collagen on the left lateral leg and silver alginate on the left buttock 6/1; left buttock area is healed. We have the left dorsal foot, left lateral leg and right dorsal foot. We are using silver alginate to the areas on both feet and silver collagen to the area on his left lateral calf 6/8; the left buttock apparently reopened late last week. He is not really sure how this happened. He is tolerating the terbinafine. Using silver alginate to all wounds 6/15; left buttock wound is larger than last week but still superficial. ooCame in the clinic today with [name] report of purulence from the left lateral leg I did  not identify any infection ooBoth areas on his dorsal feet appear to be better. He is tolerating the terbinafine. Using silver alginate to all wounds 6/22; left buttock is about the same this week, left calf quite [name] bit better. His left foot is about the same however he comes in with erythema and warmth in the right forefoot once again. Culture that I gave him in the beginning of May showed Enterobacter and E. coli. I gave him doxycycline and things seem to improve although neither 1 of these organisms was specifically plated. 6/29; left buttock is larger and dry this week. Left lateral calf looks to me to be improved. Left dorsal foot also somewhat improved right foot completely unchanged. The erythema on the right foot is still present. He is completing the Ceftin dinner that I gave him empirically [see discussion above.) 7/6 - All wounds look to be stable and perhaps improved, the left buttock wound is slightly smaller, per patient bleeds easily, completed ceftin, the right foot redness is less, he is on terbinafine 7/13; left buttock wound about the same perhaps slightly narrower. Area on the left lateral leg continues to narrow. Left dorsal foot slightly smaller right foot about the same. We are using silver alginate on the right foot and Hydrofera Blue to the areas on the left. Unna boot on the left 2 layer compression on the right 7/20; left buttock wound absolutely the same. Area on lateral leg continues to get better. Left dorsal foot require debridement as did the right no major change in the 7/27; left buttock wound the same size necrotic debris  over the surface. The area on the lateral leg is closed once again. His left foot looks better right foot about the same although there is some involvement now of the posterior first second toe area. He is still on terbinafine which I have given him for [name] month, not certain [name] centimeter major change 06/25/19-All wounds appear to be slightly  improved according to report, left buttock wound looks clean, both foot wounds have minimal to no debris the right dorsal foot has minimal slough. We are using Hydrofera Blue to the left and silver alginate to the right foot and ischial wound. 8/10-Wounds all appear to be around the same, the right forefoot distal part has some redness which was not there before, however the wound looks clean and small. Ischial wound looks about the same with no changes 8/17; his wound on the left lateral calf which was his original chronic venous insufficiency wound remains closed. Since I last saw him the areas on the left dorsal foot right dorsal foot generally appear better but require debridement. The area on his left initial tuberosity appears somewhat larger to me perhaps hyper granulated and bleeds very easily. We have been using Hydrofera Blue to the left dorsal foot and silver alginate to everything else 8/24; left lateral calf remains closed. The areas on his dorsal feet on the webspace of the first and second toes bilaterally both look better. The area on the left buttock which is the pressure ulcer stage II slightly smaller. I change the dressing to Hydrofera Blue to all areas 8/31; left lateral calf remains closed. The area on his dorsal feet bilaterally look better. Using Hydrofera Blue. Still requiring debridement on the left foot. No change in the left buttock pressure ulcers however 9/14; left lateral calf remains closed. Dorsal feet look quite [name] bit better than 2 [name] ago. Flaking dry skin also [name] lot better with the ammonium lactate I gave him 2 [name] ago. The area on the left buttock is improved. He states that his Roho cushion developed [name] leak and he is getting [name] new one, in the interim he is offloading this vigorously 9/21; left calf remains closed. Left heel which was [name] possible DTI looks better this week. He had macerated tissue around the left dorsal foot right foot looks satisfactory and  improved left buttock wound. I changed his dressings to his feet to silver alginate bilaterally. Continuing Hydrofera Blue on the left buttock. 9/28 left calf remains closed. Left heel did not develop anything [possible DTI] dry flaking skin on the left dorsal foot. Right foot looks satisfactory. Improved left buttock wound. We are using silver alginate on his feet Hydrofera Blue on the buttock. I have asked him to go back to the Lotrisone on his feet including the wounds and surrounding areas 10/5; left calf remains closed. The areas on the left and right feet about the same. [name] lot of this is epithelialized however debris over the remaining open areas. He is using Lotrisone and silver alginate. The area on the left buttock using Hydrofera Blue 10/26. Patient has been out for 3 [name] secondary to Covid concerns. He tested negative but I think his wife tested positive. He comes in today with the left foot substantially worse, right foot about the same. Even more concerning he states that the area on his left buttock closed over but then reopened and is considerably deeper in one aspect than it was before [stage III wound] 11/2; left foot really about the same  as last week. Quarter sized wound on the dorsal foot just proximal to the first second toes. Surrounding erythema with areas of denuded epithelium. This is not really much different looking. Did not look like cellulitis this time however. ooRight foot area about the same.. We have been using silver alginate alginate on his toes ooLeft buttock still substantial irritated skin around the wound which I think looks somewhat better. We have been using Hydrofera Blue here. 11/9; left foot larger than last week and [name] very necrotic surface. Right foot I think is about the same perhaps slightly smaller. Debris around the circumference also addressed. Unfortunately on the left buttock there is been [name] decline. Satellite lesions below the major wound  distally and now [name] an additional one posteriorly we have been using Hydrofera Blue but I think this is [name] pressure issue 11/16; left foot ulcer dorsally again [name] very adherent necrotic surface. Right foot is about the same. Not much change in the pressure ulcer on his left buttock. 11/30; left foot ulcer dorsally basically the same as when I saw him 2 [name] ago. Very adherent fibrinous debris on the wound surface. Patient reports [name] lot of drainage as well. 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 We have been using Iodoflex, patient changed back to alginate because of the drainage. Area on his right dorsal foot really looks benign with [name] healthier surface certainly [name] lot better than on the left. Left buttock wounds all improved using Hydrofera Blue 12/7; left dorsal foot again no improvement. Tightly adherent debris. PCR culture I did last week only showed likely skin contaminant. I have gone ahead and done [name] punch biopsy of this which is about the last thing in terms of investigations I can think to do. He has known venous insufficiency and venous hypertension and this could be the issue here. The area on the right foot is about the same left buttock slightly worse according to our intake nurse secondary to [facility] Blue sticking to the wound 12/14; biopsy of the left foot that I did last time showed changes that could be related to wound healing/chronic stasis dermatitis phenomenon no neoplasm. We have been using silver alginate to both feet. I change the one on the left today to Sorbact and silver alginate to his other 2 wounds 12/28; the patient arrives with the following problems; ooMajor issue is the dorsal left foot which continues to be [name] larger deeper wound area. Still with [name] completely nonviable surface ooParadoxically the area mirror image on the right on the right dorsal foot appears to be getting better. ooHe had some loss of dry  denuded skin from the lower part of his original wound on the left lateral calf. Some of this area looked [name] little vulnerable and for this reason we put him in wrap that on this side this week ooThe area on his left buttock is larger. He still has the erythematous circular area which I think is [name] combination of pressure, sweat. This does not look like cellulitis or fungal dermatitis 11/26/2019; -Dorsal left foot large open wound with depth. Still debris over the surface. Using Sorbact ooThe area on the dorsal right foot paradoxically has closed over [facility] has [name] reopening on the left ankle laterally at the base of his original wound that extended up into the calf. This appears clean. ooThe left buttock wound is smaller but with very adherent necrotic debris over the surface. We have been using  silver alginate here as well The patient had arterial studies done in 2017. He had biphasic waveforms at the dorsalis pedis and posterior tibial bilaterally. ABI in the left was 1.17. Digit waveforms were dampened. He has slight spasticity in the great toes I do not think [name] TBI would be possible 1/11; the patient comes in today with [name] sizable reopening between the first and second toes on the right. This is not exactly in the same location where we have been treating wounds previously. According to our intake nurse this was actually fairly deep but 0.6 cm. The area on the left dorsal foot looks about the same the surface is somewhat cleaner using Sorbact, his MRI is in 2 days. We have not managed yet to get arterial studies. The new reopening on the left lateral calf looks somewhat better using alginate. The left buttock wound is about the same using alginate 1/18; the patient had his ARTERIAL studies which were quite normal. ABI in the right at 1.13 with triphasic/biphasic waveforms on the left ABI 1.06 again with triphasic/biphasic waveforms. It would not have been possible to have done [name] toe brachial index  because of spasticity. We have been using Sorbac to the left foot alginate to the rest of his wounds on the right foot left lateral calf and left buttock 1/25; arrives in clinic with erythema and swelling of the left forefoot worse over the first MTP area. This extends laterally dorsally and but also posteriorly. Still has an area on the left lateral part of the lower part of his calf wound it is eschared and clearly not closed. ooArea on the left buttock still with surrounding irritation and erythema. ooRight foot surface wound dorsally. The area between the right and first and second toes appears better. 2/1; ooThe left foot wound is about the same. Erythema slightly better I gave him [name] week of doxycycline empirically ooRight foot wound is more extensive extending between the toes to the plantar surface ooLeft lateral calf really no open surface on the inferior part of his original wound however the entire area still looks vulnerable ooAbsolutely no improvement in the left buttock wound required debridement. 2/8; the left foot is about the same. Erythema is slightly improved I gave him clindamycin last week. ooRight foot looks better he is using Lotrimin and silver alginate ooHe has [name] breakdown in the left lateral calf. Denuded epithelium which I have removed ooLeft buttock about the same were using Hydrofera Blue 2/15; left foot is about the same there is less surrounding erythema. Surface still has tightly adherent debris which I have debriding however not making any progress ooRight foot has [name] substantial wound on the medial right second toe between the first and second webspace. ooStill an open area on the left lateral calf distal area. ooButtock wound is about the same 2/22; left foot is about the same less surrounding erythema. Surface has adherent debris. Polymen Ag Right foot area significant wound between the first and second toes. We have been using silver alginate  here Left lateral leg polymen Ag at the base of his original venous insufficiency wound ooLeft buttock some improvement here 3/1; ooRight foot is deteriorating in the first second toe webspace. Larger and more substantial. We have been using silver alginate. ooLeft dorsal foot about the same markedly adherent surface debris using PolyMem Ag ooLeft lateral calf surface debris using PolyMem AG ooLeft buttock is improved again using PolyMem Ag. ooHe is completing his terbinafine. The erythema in the foot  seems better. He has been on this for 2 [name] 3/8; no improvement in any wound area in fact he has [name] small open area on the dorsal midfoot which is new this week. He has not gotten his foot x-rays yet 3/15; his x-rays were both negative for osteomyelitis of both feet. No major change in any of his wounds on the extremities however his buttock wounds are better. We have been using polymen on the buttocks, left lower leg. Iodoflex on the left foot and silver alginate on the right 3/22; arrives in clinic today with the 2 major issues are the improvement in the left dorsal foot wound which for once actually looks healthy with [name] nice healthy wound surface without debridement. Using Iodoflex here. Unfortunately on the left lateral calf which is in the distal part of his original wound he came to the clinic here for there was purulent drainage noted some increased breakdown scattered around the original area and [name] small area proximally. We we are using polymen here will change to silver alginate today. His buttock wound on the left is better and I think the area on the right first second toe webspace is also improved 3/29; left dorsal foot looks better. Using Iodoflex. Left ankle culture from deterioration last time grew E. coli, Enterobacter and Enterococcus. I will give him [name] course of cefdinir although that will not cover Enterococcus. The area on the right foot in the webspace of the first and  second toe lateral first toe looks better. The area on his buttock is about healed Vascular appointment is on April 21. This is to look at his venous system vis--vis continued breakdown of the wounds on the left including the left lateral leg and left dorsal foot he. He has had previous ablations on this side 4/5; the area between the right first and second toes lateral aspect of the first toe looks better. Dorsal aspect of the left first toe on the left foot also improved. Unfortunately the left lateral lower leg is larger and there is [name] second satellite wound superiorly. The usual superficial abrasions on the left buttock overall better but certainly not closed 4/12; the area between the right first and second toes is improved. Dorsal aspect of the left foot also slightly smaller with [name] vibrant healthy looking surface. No real change in the left lateral leg and the left buttock wound is healed He has an unaffordable co-pay for Apligraf. Appointment with vein and vascular with regards to the left leg venous part of the circulation is on 4/21 4/19; we continue to see improvement in all wound areas. Although this is minor. He has his vascular appointment on 4/21. The area on the left buttock has not reopened although right in the center of this area the skin looks somewhat threatened 4/26; the left buttock is unfortunately reopened. In general his left dorsal foot has [name] healthy surface and looks somewhat smaller although it was not measured as such. The area between his first and second toe webspace on the right as [name] small wound against the first toe. The patient saw vascular surgery. The real question I was asking was about the small saphenous vein on the left. He has previously ablated left greater saphenous vein. Nothing further was commented on on the left. Right greater saphenous vein without reflux at the saphenofemoral junction or proximal thigh there was no indication for ablation of the  right greater saphenous vein duplex was negative for DVT bilaterally. They did not  think there was anything from [name] point of view that could be offered. They ABIs within normal limits 5/3; only small open area on the left buttock. The area on the left lateral leg which was his original venous reflux is now 2 wounds both which look clean. We are using Iodoflex on the left dorsal foot which looks healthy and smaller. He is down to [name] very tiny area between the right first and second toes, using silver alginate 5/10; all of his wounds appear better. We have much better edema control in 4 layer compression on the left. This may be the factor that is allowing the left foot and left lateral calf to heal. He has external compression garments at home 04/14/20-All of his wounds are progressing well, the left forefoot is practically closed, left ischium appears to be about the same, right toe webspace is also smaller. The left lateral leg is about the same, continue using Hydrofera Blue to this, silver alginate to the ischium, Iodoflex to the toe space on the right 6/7; most of his wounds outside of the left buttock are doing well. The area on the left lateral calf and left dorsal foot are smaller. The area on the right foot in between the first and second toe webspace is barely visible although he still says there is some drainage here is the only reason I did not heal this out. ooUnfortunately the area on the left buttock almost looks like he has [name] skin tear from tape. He has open wound and then [name] large flap of skin that we are trying to get adherence over an area just next to the remaining wound 6/21; 2 week follow-up. I believe is been here for nurse visits. Miraculously the area between his first and second toes on the left dorsal foot is closed over. Still open on the right first second web space. The left lateral calf has 2 open areas. Distally this is more superficial. The proximal area  had [name] little more depth and required debridement of adherent necrotic material. His buttock wound is actually larger we have been using silver alginate here 6/28; the patient's area on the left foot remains closed. Still open wet area between the first and second toes on the right and also extending into the plantar aspect. We have been using silver alginate in this location. He has 2 areas on the left lower leg part of his original long wounds which I think are better. We have been using Hydrofera Blue here. Hydrofera Blue to the left buttock which is stable 7/12; left foot remains closed. Left ankle is closed. May be [name] small area between his right first and second toes the only truly open area is on the left buttock. We have been using Hydrofera Blue here 7/19; patient arrives with marked deterioration especially in the left foot and ankle. We did not put him in [name] compression wrap on the left last week in fact he wore his juxta lite stockings on either side although he does not have an underlying stocking. He has [name] reopening on the left dorsal foot, left lateral ankle and [name] new area on the right dorsal ankle. More worrisome is the degree of erythema on the left foot extending on the lateral foot into the lateral lower leg on the left 7/26; the patient had erythema and drainage from the lateral left ankle last week. Culture of this grew MRSA resistant to doxycycline and clindamycin which are the 2 antibiotics we  usually use with this patient who has multiple antibiotic allergies including linezolid, trimethoprim sulfamethoxazole. I had give him an empiric doxycycline and he comes in the area certainly looks somewhat better although it is blotchy in his lower leg. He has not been systemically unwell. He has had areas on the left dorsal foot which is [name] reopening, chronic wounds on the left lateral ankle. Both of these I think are secondary to chronic venous insufficiency. The area between his first and  second toes is closed as far as I can tell. He had [name] new wrap injury on the right dorsal ankle last week. Finally he has an area on the left buttock. We have been using silver alginate to everything except the left buttock we are using Hydrofera Blue 06/30/20-Patient returns at 1 week, has been given [name] sample dose pack of NUZYRA which is [name] tetracycline derivative [omadacycline], patient has completed those, we have been using silver alginate to almost all the wounds except the left ischium where we are using Hydrofera Blue all of them look better 8/16; since I last saw the patient he has been doing well. The area on the left buttock, left lateral ankle and left foot are all closed today. He has completed the [place] I gave him last time and tolerated this well. He still has open areas on the right dorsal ankle and in the right first second toe area which we are using silver alginate. 8/23; we put him in his bilateral external compression stockings last week as he did not have anything open on either leg except for concerning area between the right first and second toe. He comes in today with an area on the left dorsal foot slightly more proximal than the original wound, the left lateral foot but this is actually [name] continuation of the area he had on the left lateral ankle from last time. As well he is opened up on the left buttock again. 8/30; comes in today with things looking [name] lot better. The area on the left lower ankle has closed down as has the left foot but with eschar in both areas. The area on the dorsal right ankle is also epithelialized. Very little remaining of the left buttock wound. We have been using silver alginate on all wound areas 9/13; the area in the first second toe webspace on the right has fully epithelialized. He still has some vulnerable epithelium on the right and the ankle and the dorsal foot. He notes weeping. He is using his juxta lite stocking. On the left again the left  dorsal foot is closed left lateral ankle is closed. We went to the juxta lite stocking here as well. ooStill vulnerable in the left buttock although only 2 small open areas remain here 9/27; 2-week follow-up. We did not look at his left leg but the patient says everything is closed. He is [name] bit disturbed by the amount of edema in his left foot he is using juxta lite stockings but asking about over the toes stockings which would be 30/40, will talk to him next time. According to him there is no open wound on either the left foot or the left ankle/calf He has an open area on the dorsal right calf which I initially point [name] wrap injury. He has superficial remaining wound on the left ischial tuberosity been using silver alginate although he says this sticks to the wound 10/5; we gave him 2-week follow-up but he called yesterday expressing some concerns about his  right foot right ankle and the left buttock. He came in early. There is still no open areas on the left leg and that still in his juxta lite stocking 10/11; he only has 1 small area on the left buttock that remains measuring millimeters 1 mm. Still has the same irritated skin in this area. We recommended zinc oxide when this eventually closes and pressure relief is meticulously is he can do this. He still has an area on the dorsal part of his right first through third toes which is [name] bit irritated and still open and on the dorsal ankle near the crease of the ankle. We have been using silver alginate and using his own stocking. He has nothing open on the left leg or foot 10/25; 2-week follow-up. Not nearly as good on the left buttock as I was hoping. For open areas with 5 looking threatened small. He has the erythematous irritated chronic skin in this area. oo1 area on the right dorsal ankle. He reports this area bleeds easily ooRight dorsal foot just proximal to the base of his toes ooWe have been using silver alginate. 11/8; 2-week  follow-up. Left buttock is about the same although I do not think the wounds are in the same location we have been using silver alginate. I have asked him to use zinc oxide on the skin around the wounds. ooHe still has [name] small area on the right dorsal ankle he reports this bleeds easily ooRight dorsal foot just proximal to the base of the toes does not have anything open although the skin is very dry and scaly ooHe has [name] new opening on the nailbed of the left great toe. Nothing on the left ankle 11/29; 3-week follow-up. Left buttock has 2 open areas. And washing of these wounds today started bleeding easily. Suggesting very friable tissue. We have been using silver alginate. Right dorsal ankle which I thought was initially [name] wrap injury we have been using silver alginate. Nothing open between the toes that I can see. He states the area on the left dorsal toe nailbed healed after the last visit in 2 or 3 days 12/13; 3-week follow-up. His left buttock now has 3 open areas but the original 2 areas are smaller using polymen here. Surrounding skin looks better. The right dorsal ankle is closed. He has [name] small opening on the right dorsal foot at the level of the third toe. In general the skin looks better here. He is wearing his juxta lite stocking on the left leg says there is nothing open 11/24/2020; 3 [name] follow-up. His left buttock still has the 3 open areas. We have been using polymen but due to lack of response he changed to [facility] area. Surrounding skin is dry erythematous and irritated looking. There is no evidence of infection either bacterial or fungal however there is loss of surface epithelium ooHe still has very dry skin in his foot causing irritation and erythema on the dorsal part of his toes. This is not responded to prolonged courses of antifungal simply looks dry and irritated 1/24; left buttock area still looks about the same he was unable to find the triad ointment that we  had suggested. The area on the right lower leg just above the dorsal ankle has reopened and the areas on the right foot between the first second and second third toes and scaling on the bottom of the foot has been about the same for quite some time now. been using silver alginate  to all wound areas 2/7; left buttock wound looked quite good although not much smaller in terms of surface area surrounding skin looks better. Only [name] few dry flaking areas on the right foot in between the first and second toes the skin generally looks better here [ammonium lactate]. Finally the area on the right dorsal ankle is closed 2/21; ooThere is no open area on the right foot even between the right first and second toe. Skin around this area dorsally and plantar aspects look better. ooHe has [name] reopening of the area on the right ankle just above the crease of the ankle dorsally. I continue to think that this is probably friction from spasms may be even this time with his stocking under the compression stockings. ooWounds on his left buttock look about the same there [name] couple of areas that have reopened. He has [name] total square area of loss of epithelialization. This does not look like infection it looks like [name] contact dermatitis but I just cannot determine to what 3/14; there is nothing on the right foot between the first and second toes this was carefully inspected under illumination. Some chronic irritation on the dorsal part of his foot from toes 1-3 at the base. Nothing really open here substantially. Still has an area on the right foot/ankle that is actually larger and hyper granulated. His buttock area on the left is just about closed however he has chronic inflammation with loss of the surface epithelial layer 3/28; 2-week follow-up. In clinic today with [name] new wound on the left anterior mid tibia. Says this happened about 2 [name] ago. He is not really sure how wonders about the spasticity of his legs at night  whether that could have caused this other than that he does not have [name] good idea. He has been using topical antibiotics and silver alginate. The area on his right dorsal ankle seems somewhat better. ooFinally everything on his left buttock is closed. 4/11; 2-week follow-up. All of his wounds are better except for the area over the ischium and left buttock which have opened up widely again. At least part of this is covered in necrotic fibrinous material another part had rolled nonviable skin. The area on the right ankle, left anterior mid tibia are both [name] lot better. He had no open wounds on either foot including the areas between the first and second toes 4/25; patient presents for 2-week follow-up. He states that the wounds are overall stable. He has no complaints today and states he is using Hydrofera Blue to open wounds. 5/9; have not seen this man in over [name] month. For my memory he has open areas on the left mid tibia and right ankle. T oday he has new open area on the right dorsal foot which we have not had [name] problem with recently. He has the sustained area on the left buttock He is also changed his insurance at the beginning of the year [employer]. We will need prior authorizations for debridement 5/23; patient presents for 2-week follow-up. He has prior authorizations for debridement. He denies any issues in the past 2 [name] with his wound care. He has been using Hydrofera Blue to all the wounds. He does report [name] circular rash to the upper left leg that is new. He denies acute signs of infection. 6/6; 2-week follow-up. The patient has open wounds on the left buttock which are worse than the last time I saw this about [name] month ago. He also has [name] new  area to me on the left anterior mid tibia with some surrounding erythema. The area on the dorsal ankle on the right is closed but I think this will be [name] friction injury every time this area is exposed to either our wraps or his compression  stockings caused by unrelenting spasms in this leg. 6/20; 2-week follow-up. oo The patient has open wounds on the left buttock which is about the same. Using [facility] here. - The left mid tibia has [name] static amount of surrounding erythema. Also [name] raised area in the center. We have been using Hydrofera Blue here. ooooFinally he has broken down in his dorsal right foot extending between the first and second toes and going to the base of the first and second toe webspace. I have previously assumed that this was severe venous hypertension 7/5; 2-week follow-up oo The left buttock wound actually looks better. We are using Hydrofera Blue. He has extensive skin irritation around this area and I have not really been able to get that any better. I have tried Lotrisone i.e. antifungals and steroids. More most recently we have just been using Coloplast really looks about the same. oo The left mid tibia which was new last week culture to have very resistant staph aureus. Not only methicillin-resistant but doxycycline resistant. The patient has [name] plethora of antibiotic allergies including sulfa, linezolid. I used topical bacitracin on this but he has not started this yet. oo In addition he has an expanding area of erythema with [name] wound on the dorsal right foot. I did [name] deep tissue culture of this area today 7/12; oo Left buttock area actually looks better surrounding skin also looks less irritated. oo Left mid tibia looks about the same. He is using bacitracin this is not worse oo Right dorsal foot looks about the same as well. oo The left first toe also looks about the same 7/19; left buttock wound continues to improve in terms of open areas oo Left mid tibia is still concerning amount of swelling he is using bacitracin oo Dorsal left first toe somewhat smaller oo Right dorsal foot somewhat smaller 7/25; left buttock wound actually continues to improve oo Left mid tibia area has less  swelling. I gave him all my samples of new Nuzyra. This seems to have helped although the wound is still open it. His abrasion closed by here oo Left dorsal great toe really no better. Still [name] very nonviable surface oo Right dorsal foot perhaps some better. oo We have been using bacitracin and silver nitrate to the areas on his lower legs and Hydrofera Blue to the area on the buttock. 8/16 oo Disappointed that his left buttock wound is actually more substantial. Apparently during the last nurse visit these were both very small. He has continued irritation to [name] large area of skin on his buttock. I have never been able to totally explain this although I think it some combination of the way he sits, pressure, moisture. He is not incontinent enough to contribute to this. oo Left dorsal great toe still fibrinous debris on the surface that I have debrided today oo Large area across the dorsal right toes. oo The area on the left anterior mid tibia has less swelling. He completed the [place]. This does not look infected although the tissue is still fried 8/30; 2-week follow-up. oo Left buttock areas not improved. We used Hydrofera Blue on this. Weeping wet with the surrounding erythema that I have not been able to control even  with Lotrisone and topical Coloplast oo Left dorsal great toe looks about the same oo More substantial area again at the base of his toes on the left which is new this week. oo Area across the dorsal right toes looks improved oo The left anterior mid tibia looks like it is trying to close 9/13; 2-week follow-up. Using silver alginate on all of his wounds. The left dorsal foot does not look any better. He has the area on the dorsal toe and also the areas at the base of all of his toes 1 through 3. On the right foot he has [name] similar pattern in [name] similar area. He has the area on his left mid tibia that looks fairly healthy. Finally the area on his left buttock looks  somewhat bette 9/20; culture I did of the left foot which was [name] deep tissue culture last time showed E. coli he has erythema around this wound. Still [name] completely necrotic surface. His right dorsal foot looks about the same. He has [name] very friable surface to the left anterior mid tibia. Both buttock wounds look better. We have been using silver alginate to all wounds 10/4; he has completed the cephalexin that I gave him last time for the left foot. He is using topical gentamicin under silver alginate silver alginate being applied to all the wounds. Unfortunately all the wounds look irritated on his dorsal right foot dorsal left foot left mid tibia. I wonder if this could be [name] silver allergy. I am going to change him to [facility] on the lower extremity. The skin on the left buttock and left posterior thigh still flaking dry and irritated. This has continued no matter what I have applied topically to this. He has [name] solitary open wound which by itself does not look too bad however the entire area of surrounding skin does not change no matter what we have applied here 10/18; the area on the left dorsal foot and right dorsal foot both look better. The area on the right extends into the plantar but not between his toes. We have been using silver alginate. He still has [name] rectangular erythematous area around the area on the left tibia. The wound itself is very small. Finally everything on his left buttock looks [name] little larger the skin is erythematous 11/15; patient comes in with the left dorsal and right dorsal foot distally looking somewhat better. Still nonviable surface on the left foot which required debridement. He still has the area on the left anterior mid tibia although this looks somewhat better. He has [name] new area on the right lateral lower leg just above the ankle. Finally his left buttock looks terrible with multiple superficial open wounds geometric square shaped area of chronic erythema which  I have not been able to sort through 11/29; right dorsal foot and left dorsal foot both look somewhat better. No debridement required. He has the fragile area on the left anterior mid tibia this looks and continues to look somewhat better. Right lateral lower leg just above the ankle we identified last time also looks better. In general the area on his left buttock looks improved. We are using Hydrofera Blue to all wound areas Objective Constitutional Sitting or standing Blood Pressure is within target range for patient.. Pulse regular and within target range for patient.Marland Kitchen Respirations regular, non-labored and within target range.. Temperature is normal and within the target range for the patient.Marland Kitchen Appears in no distress. Vitals Time Taken: 9:36 AM, Height: 70  in, Weight: 216 lbs, BMI: 31, Temperature: 98.3 F, Pulse: 128 bpm, Respiratory Rate: 18 breaths/min, Blood Pressure: 134/79 mmHg. Cardiovascular Pedal pulses palpable. Edema present in both extremities.. General Notes: Wound exam; right dorsal foot somewhat better elongated wounds across the base of several toes. The left dorsal foot also looks improved measuring slightly smaller. The left anterior mid tibia is almost epithelialized however it does not look 100% viable. oo The left buttock wound is smaller geographically the skin looks better. No evidence of cellulitis Integumentary (Hair, Skin) Wound #41R status is Open. Original cause of wound was Gradually Appeared. The date acquired was: 03/16/2020. The wound has been in treatment 83 [name]. The wound is located on the Left Ischium. The wound measures 7cm length x 4cm width x 0.1cm depth; 21.991cm^2 area and 2.199cm^3 volume. There is Fat Layer (Subcutaneous Tissue) exposed. There is no tunneling or undermining noted. There is [name] medium amount of sanguinous drainage noted. The wound margin is distinct with the outline attached to the wound base. There is large (67-100%) red, friable  granulation within the wound bed. There is no necrotic tissue within the wound bed. General Notes: Periwound irritation Wound #51 status is Open. Original cause of wound was Trauma. The date acquired was: 01/26/2021. The wound has been in treatment 35 [name]. The wound is located on the Left,Anterior Lower Leg. The wound measures 1.7cm length x 0.7cm width x 0.1cm depth; 0.935cm^2 area and 0.093cm^3 volume. There is Fat Layer (Subcutaneous Tissue) exposed. There is no tunneling or undermining noted. There is [name] medium amount of serosanguineous drainage noted. The wound margin is distinct with the outline attached to the wound base. There is large (67-100%) red, hyper - granulation within the wound bed. There is no necrotic tissue within the wound bed. Wound #52 status is Open. Original cause of wound was Gradually Appeared. The date acquired was: 03/27/2021. The wound has been in treatment 29 [name]. The wound is located on the Right,Dorsal Foot. The wound measures 2.4cm length x 4.5cm width x 0.1cm depth; 8.482cm^2 area and 0.848cm^3 volume. There is Fat Layer (Subcutaneous Tissue) exposed. There is no tunneling or undermining noted. There is [name] medium amount of purulent drainage noted. The wound margin is distinct with the outline attached to the wound base. There is large (67-100%) red granulation within the wound bed. There is [name] small (1-33%) amount of necrotic tissue within the wound bed including Adherent Slough. Wound #55 status is Open. Original cause of wound was Blister. The date acquired was: 05/26/2021. The wound has been in treatment 21 [name]. The wound is located on the Left T Great. The wound measures 0.3cm length x 0.2cm width x 0.2cm depth; 0.047cm^2 area and 0.009cm^3 volume. There is Fat Layer oe (Subcutaneous Tissue) exposed. There is no tunneling or undermining noted. There is [name] medium amount of serosanguineous drainage noted. The wound margin is distinct with the outline attached to the  wound base. There is large (67-100%) red, friable granulation within the wound bed. There is no necrotic tissue within the wound bed. Wound #56 status is Open. Original cause of wound was Gradually Appeared. The date acquired was: 07/11/2021. The wound has been in treatment 13 [name]. The wound is located on the Left,Dorsal Foot. The wound measures 2.2cm length x 2.2cm width x 0.3cm depth; 3.801cm^2 area and 1.14cm^3 volume. There is Fat Layer (Subcutaneous Tissue) exposed. There is no tunneling or undermining noted. There is [name] medium amount of purulent drainage noted. The wound margin is  distinct with the outline attached to the wound base. There is large (67-100%) red granulation within the wound bed. There is [name] small (1-33%) amount of necrotic tissue within the wound bed including Adherent Slough. Wound #57 status is Open. Original cause of wound was Gradually Appeared. The date acquired was: 08/25/2021. The wound has been in treatment 8 [name]. The wound is located on the [place]. The wound measures 2cm length x 2cm width x 0.1cm depth; 3.142cm^2 area and 0.314cm^3 volume. There is Fat Layer (Subcutaneous Tissue) exposed. There is no tunneling or undermining noted. There is [name] medium amount of purulent drainage noted. The wound margin is distinct with the outline attached to the wound base. There is medium (34-66%) red granulation within the wound bed. There is [name] medium (34-66%) amount of necrotic tissue within the wound bed including Adherent Slough. Wound #58 status is Open. Original cause of wound was Gradually Appeared. The date acquired was: 09/23/2021. The wound has been in treatment 2 [name]. The wound is located on the Right,Lateral Lower Leg. The wound measures 0.5cm length x 0.8cm width x 0.1cm depth; 0.314cm^2 area and 0.031cm^3 volume. There is Fat Layer (Subcutaneous Tissue) exposed. There is no tunneling or undermining noted. There is [name] medium amount of serosanguineous drainage  noted. The wound margin is distinct with the outline attached to the wound base. There is large (67-100%) red, friable granulation within the wound bed. There is no necrotic tissue within the wound bed. 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 - with Dr. Dellia Nims ****extra time 75 minutes*** Bathing/ Shower/ Hygiene: May shower and wash wound with soap and water. - on days that dressing is changed Edema Control - Lymphedema / SCD / Other: Elevate legs to the level of the heart or above for 30 minutes daily and/or when sitting, [name] frequency of: - throughout the day Compression stocking or Garment 30-40 mm/Hg pressure to: - Juxtalite to both legs daily Off-Loading: Roho cushion for wheelchair Turn and reposition every 2 hours WOUND #41R: - Ischium Wound Laterality: Left Cleanser: Soap and Water Every Other Day/30 Days Discharge Instructions: May shower and wash wound with dial antibacterial soap and water prior to dressing change. Peri-Wound Care: Triad Hydrophilic Wound Dressing Tube, 6 (oz) Every Other Day/30 Days Discharge Instructions: Apply to periwound with each dressing change Prim Dressing: Hydrofera Blue Ready Foam, 2.5 x2.5 in Every Other Day/30 Days ary Discharge Instructions: Apply to wound bed as instructed Secondary Dressing: ABD Pad, 5x9 Every Other Day/30 Days Discharge Instructions: Apply over  primary dressing as directed. Secured With: 97M Medipore H Soft Cloth Surgical T 4 x 2 (in/yd) Every Other Day/30 Days ape Discharge Instructions: Secure dressing with tape as directed. WOUND #51: - Lower Leg Wound Laterality: Left, Anterior Cleanser: Soap and Water Every Other Day/30 Days Discharge Instructions: May shower and wash wound with dial antibacterial soap and water prior to dressing change. Prim Dressing: Hydrofera Blue Ready Foam, 4x5 in Every Other Day/30 Days ary Discharge Instructions:  Apply to wound bed as instructed Secondary Dressing: Woven Gauze Sponge, Non-Sterile 4x4 in Every Other Day/30 Days Discharge Instructions: Apply over primary dressing as directed. Secondary Dressing: ABD Pad, 5x9 Every Other Day/30 Days Discharge Instructions: Apply over primary dressing as directed. Secured With: [employer], 4.5x3.1 (in/yd) Every Other Day/30 Days Discharge Instructions: Secure with Kerlix as directed. WOUND #52: - Foot Wound Laterality: Dorsal, Right Cleanser: Soap and Water Every Other Day/30 Days Discharge Instructions: May shower and wash wound with dial antibacterial soap and water prior to dressing change. Topical: Gentamicin Every Other Day/30 Days Discharge Instructions: As directed by physician Prim Dressing: Hydrofera Blue Ready Foam, 2.5 x2.5 in Every Other Day/30 Days ary Discharge Instructions: Apply to wound bed as instructed Secondary Dressing: Woven Gauze Sponge, Non-Sterile 4x4 in Every Other Day/30 Days Discharge Instructions: Apply over primary dressing as directed. Secured With: [employer], 4.5x3.1 (in/yd) Every Other Day/30 Days Discharge Instructions: Secure with Kerlix as directed. Secured With: Transpore Surgical T ape, 2x10 (in/yd) Every Other Day/30 Days Discharge Instructions: Secure dressing with tape as directed. WOUND #55: - T Great Wound Laterality: Left oe Cleanser: Soap and Water Every Other Day/30 Days Discharge  Instructions: May shower and wash wound with dial antibacterial soap and water prior to dressing change. Prim Dressing: Hydrofera Blue Ready Foam, 2.5 x2.5 in Every Other Day/30 Days ary Discharge Instructions: Apply to wound bed as instructed Secondary Dressing: Woven Gauze Sponge, Non-Sterile 4x4 in Every Other Day/30 Days Discharge Instructions: Apply over primary dressing as directed. Secured With: [employer], 4.5x3.1 (in/yd) Every Other Day/30 Days Discharge Instructions: Secure with Kerlix as directed. Secured With: Transpore Surgical T ape, 2x10 (in/yd) Every Other Day/30 Days Discharge Instructions: Secure dressing with tape as directed. WOUND #56: - Foot Wound Laterality: Dorsal, Left Cleanser: Soap and Water Every Other Day/30 Days Discharge Instructions: May shower and wash wound with dial antibacterial soap and water prior to dressing change. Topical: Gentamicin Every Other Day/30 Days Discharge Instructions: Apply directly to wound bed, under alginate Prim Dressing: Hydrofera Blue Classic Foam, 4x4 in Every Other Day/30 Days ary Discharge Instructions: Moisten with saline prior to applying to wound bed Secondary Dressing: Woven Gauze Sponge, Non-Sterile 4x4 in Every Other Day/30 Days Discharge Instructions: Apply over primary dressing as directed. Secured With: [employer], 4.5x3.1 (in/yd) Every Other Day/30 Days Discharge Instructions: Secure with Kerlix as directed. Secured With: Transpore Surgical T ape, 2x10 (in/yd) Every Other Day/30 Days Discharge Instructions: Secure dressing with tape as directed. WOUND #57: - Foot Wound Laterality: Plantar, Right Cleanser: Soap and Water Every Other Day/30 Days Discharge Instructions: May shower and wash wound with dial antibacterial soap and water prior to dressing change. Prim Dressing: Hydrofera Blue Ready Foam, 2.5 x2.5 in Every Other Day/30 Days ary Discharge Instructions: Apply to wound bed as  instructed Secondary Dressing: Woven Gauze Sponge, Non-Sterile 4x4 in Every Other Day/30 Days Discharge Instructions: Apply over primary dressing as directed. Secured With: [employer], 4.5x3.1 (in/yd) Every Other Day/30 Days Discharge Instructions: Secure with Kerlix as directed. Secured With: 106M Medipore H Soft Cloth Surgical T 4 x 2 (in/yd) Every Other Day/30 Days ape Discharge Instructions: Secure dressing with tape as directed. WOUND #58: - Lower Leg Wound Laterality: Right, Lateral Cleanser: Soap and Water Every Other Day/30 Days Discharge Instructions: May shower and wash wound with dial antibacterial soap and water prior to dressing change. Prim Dressing: Hydrofera Blue Ready Foam, 2.5 x2.5 in Every Other Day/30 Days ary Discharge  Instructions: Apply to wound bed as instructed Secondary Dressing: Woven Gauze Sponge, Non-Sterile 4x4 in Every Other Day/30 Days Discharge Instructions: Apply over primary dressing as directed. Secured With: [employer], 4.5x3.1 (in/yd) Every Other Day/30 Days Discharge Instructions: Secure with Kerlix as directed. Secured With: Transpore Surgical T ape, 2x10 (in/yd) Every Other Day/30 Days Discharge Instructions: Secure dressing with tape as directed. 1. We are using Hydrofera Blue to all his wounds. 2. On the left buttock his skin looks somewhat better he is using Coloplast on the skin. It looks less irritated. Hydrofera Blue on the wound on the left buttock. And then covering this with an ABD or border foam 3. He is using his own stockings on the legs and his edema control is actually quite good Electronic Signature(s) Signed: 10/20/2021 5:06:47 PM By: Linton Ham MD Entered By: Linton Ham on 10/20/2021 10:51:13 -------------------------------------------------------------------------------- SuperBill Details Patient Name: Date of Service: [name] [name], [name] [name] 10/20/2021 Medical Record Number: [id number] Patient Account Number:  [id number] Date of Birth/Sex: Treating RN: 1986-04-12 (35 y.o. [name] Primary Care Provider: Penitas, George Other Clinician: Referring Provider: Treating Provider/Extender: [name] [name] in Treatment: 302 Diagnosis Coding ICD-10 Codes Code Description I87.332 Chronic venous hypertension (idiopathic) with ulcer and inflammation of left lower extremity L97.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, stage 3 L97.821 Non-pressure chronic ulcer of other part of left lower leg limited to breakdown of skin L97.521 Non-pressure chronic ulcer of other part of left foot limited to breakdown of skin L97.818 Non-pressure chronic ulcer of other part of right lower leg with other 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, complete Facility Procedures CPT4 Code: [id number] Description: 15 - WOUND CARE VISIT-LEV 5 EST PT Modifier: 1 Quantity: Physician Procedures : CPT4 Code Description Modifier [id number] - WC PHYS LEVEL 3 - EST PT ICD-10 Diagnosis Description L97.821 Non-pressure chronic ulcer of other part of left lower leg limited to breakdown of skin L97.511 Non-pressure chronic ulcer of other part of  right foot limited to breakdown of 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, stage 3 L97.521 Non-pressure chronic ulcer of other part of left foot limited to breakdown of skin Quantity: 1 Electronic Signature(s) Signed: 10/20/2021 5:06:47 PM By: Linton Ham MD Entered By: Linton Ham on 10/20/2021 10:55:25

## 2021-11-03 ENCOUNTER — Other Ambulatory Visit: Payer: Self-pay

## 2021-11-03 ENCOUNTER — Encounter (HOSPITAL_BASED_OUTPATIENT_CLINIC_OR_DEPARTMENT_OTHER): Payer: BC Managed Care – PPO | Attending: Internal Medicine | Admitting: Internal Medicine

## 2021-11-03 DIAGNOSIS — Z87891 Personal history of nicotine dependence: Secondary | ICD-10-CM | POA: Diagnosis not present

## 2021-11-03 DIAGNOSIS — L89323 Pressure ulcer of left buttock, stage 3: Secondary | ICD-10-CM | POA: Insufficient documentation

## 2021-11-03 DIAGNOSIS — Z89422 Acquired absence of other left toe(s): Secondary | ICD-10-CM | POA: Diagnosis not present

## 2021-11-03 DIAGNOSIS — G6289 Other specified polyneuropathies: Secondary | ICD-10-CM | POA: Diagnosis not present

## 2021-11-03 DIAGNOSIS — G8221 Paraplegia, complete: Secondary | ICD-10-CM | POA: Insufficient documentation

## 2021-11-03 DIAGNOSIS — L97521 Non-pressure chronic ulcer of other part of left foot limited to breakdown of skin: Secondary | ICD-10-CM | POA: Insufficient documentation

## 2021-11-03 DIAGNOSIS — L89892 Pressure ulcer of other site, stage 2: Secondary | ICD-10-CM | POA: Diagnosis not present

## 2021-11-03 DIAGNOSIS — L97522 Non-pressure chronic ulcer of other part of left foot with fat layer exposed: Secondary | ICD-10-CM | POA: Diagnosis not present

## 2021-11-03 DIAGNOSIS — G8220 Paraplegia, unspecified: Secondary | ICD-10-CM | POA: Diagnosis not present

## 2021-11-03 DIAGNOSIS — L97821 Non-pressure chronic ulcer of other part of left lower leg limited to breakdown of skin: Secondary | ICD-10-CM | POA: Diagnosis not present

## 2021-11-03 DIAGNOSIS — I87332 Chronic venous hypertension (idiopathic) with ulcer and inflammation of left lower extremity: Secondary | ICD-10-CM | POA: Diagnosis not present

## 2021-11-03 DIAGNOSIS — L97511 Non-pressure chronic ulcer of other part of right foot limited to breakdown of skin: Secondary | ICD-10-CM | POA: Diagnosis not present

## 2021-11-03 DIAGNOSIS — L97512 Non-pressure chronic ulcer of other part of right foot with fat layer exposed: Secondary | ICD-10-CM | POA: Diagnosis not present

## 2021-11-03 NOTE — Progress Notes (Signed)
[name], [name] ([id number]) Visit Report for 11/03/2021 Arrival Information Details Patient Name: Date of Service: [name], [name] 11/03/2021 8:00 [name] Medical Record Number: [id number] Patient Account Number: [id number] Date of Birth/Sex: Treating RN: Apr 13, 1986 (35 y.o. [name] Primary Care Milianna Ericsson: [name], [name] Other Clinician: Referring Chanell Nadeau: Treating Yaiza Palazzola/Extender: [name]: 76 Visit Information History Since Last Visit Added or deleted any medications: No Patient Arrived: Ambulatory Any new allergies or adverse reactions: No Arrival Time: 08:07 Had [name] fall or experienced change in No Accompanied By: self activities of daily living that may affect Transfer Assistance: Manual risk of falls: Patient Identification Verified: Yes Signs or symptoms of abuse/neglect since last visito No Secondary Verification Process Completed: Yes Hospitalized since last visit: No Patient Requires Transmission-Based Precautions: No Implantable device outside of the clinic excluding No Patient Has Alerts: Yes cellular tissue based products placed in the center Patient Alerts: R ABI = 1.0 since last visit: L ABI = 1.1 Has Dressing in Place as Prescribed: Yes Pain Present Now: No Electronic Signature(s) Signed: 11/03/2021 4:54:55 PM By: Rhae Hammock RN Entered By: Rhae Hammock on 11/03/2021 08:12:06 -------------------------------------------------------------------------------- Encounter Discharge Information Details Patient Name: Date of Service: [name], [name] 11/03/2021 8:00 [name] Medical Record Number: [id number] Patient Account Number: [id number] Date of Birth/Sex: Treating RN: Jul 15, 1986 (35 y.o. [name] Primary Care Star Resler: [place], [place] Other Clinician: Referring Lizbett Garciagarcia: Treating Mallerie Blok/Extender: [name]: [phone] Encounter Discharge Information Items Post  Procedure Vitals Discharge Condition: Stable Temperature (F): 97.4 Ambulatory Status: Wheelchair Pulse (bpm): 74 Discharge Destination: Home Respiratory Rate (breaths/min): 17 Transportation: Private Auto Blood Pressure (mmHg): 134/73 Accompanied By: self Schedule Follow-up Appointment: Yes Clinical Summary of Care: Patient Declined Electronic Signature(s) Signed: 11/03/2021 4:54:55 PM By: Rhae Hammock RN Entered By: Rhae Hammock on 11/03/2021 08:51:18 -------------------------------------------------------------------------------- Lower Extremity Assessment Details Patient Name: Date of Service: [name], [name] 11/03/2021 8:00 [name] Medical Record Number: [id number] Patient Account Number: [id number] Date of Birth/Sex: Treating RN: [birth year] (35 y.o. [name] Primary Care Shanine Kreiger: [place], [place] Other Clinician: Referring Rashay Barnette: Treating Jakoby Melendrez/Extender: [name]: 304 Edema Assessment Assessed: [Left: Yes] [Right: Yes] Edema: [Left: Yes] [Right: Yes] Calf Left: Right: Point of Measurement: 33 cm From Medial Instep 29 cm 32 cm Ankle Left: Right: Point of Measurement: 10 cm From Medial Instep 25 cm 25 cm Vascular Assessment Pulses: Dorsalis Pedis Palpable: [Left:Yes] [Right:Yes] Posterior Tibial Palpable: [Left:Yes] [Right:Yes] Electronic Signature(s) Signed: 11/03/2021 4:54:55 PM By: Rhae Hammock RN Entered By: Rhae Hammock on 11/03/2021 08:12:54 -------------------------------------------------------------------------------- Multi Wound Chart Details Patient Name: Date of Service: [name], [name] 11/03/2021 8:00 [name] Medical Record Number: [id number] Patient Account Number: [id number] Date of Birth/Sex: Treating RN: 09-20-1986 (35 y.o. [name] Primary Care Deyjah Kindel: [name], [name] Other Clinician: Referring Bee Marchiano: Treating Dj Senteno/Extender: Dutch Gray,  [name]: 304 Vital Signs Height(in): 70 Pulse(bpm): 137 Weight(lbs): 216 Blood Pressure(mmHg): 133/74 Body Mass Index(BMI): 31 Temperature(F): 98.3 Respiratory Rate(breaths/min): 17 Photos: [41R:No Photos Left Ischium] [51:No Photos Left, Anterior Lower Leg] [52:No Photos Right, Dorsal Foot] Wound Location: [41R:Gradually Appeared] [51:Trauma] [52:Gradually Appeared] Wounding Event: [41R:Pressure Ulcer] [51:Trauma, Other] [52:Trauma, Other] Primary Etiology: [41R:Sleep Apnea, Hypertension, Paraplegia [name]] [52:Sleep Apnea, Hypertension, Paraplegia] Comorbid History: [41R:03/16/2020] [51:01/26/2021] [52:03/27/2021] Date Acquired: [41R:85] [51:37] [52:31] Weeks of Treatment: [41R:Open] [51:Healed - Epithelialized] [52:Open] Wound Status: [41R:Yes] [51:No] [52:No] Wound Recurrence: [41R:Yes] [51:No] [52:No] Clustered Wound: [41R:2] [51:[name]] [52:[name]]  Clustered Quantity: [41R:1.5x0.4x0.1] [51:0x0x0] [52:1x4x0.1] Measurements L x W x D (cm) [41R:0.471] [51:0] [52:3.142] [name] (cm) : rea [41R:0.047] [51:0] [52:0.314] Volume (cm) : [41R:73.90%] [51:100.00%] [52:-66.70%] % Reduction in Area: [41R:74.00%] [51:100.00%] [52:-67.00%] % Reduction in Volume: [41R:Category/Stage II] [51:Full Thickness Without Exposed] [52:Full Thickness Without Exposed] Classification: [41R:Medium] [51:Support Structures Medium] [52:Support Structures Medium] Exudate [name] mount: [41R:Sanguinous] [51:Serosanguineous] [52:Purulent] Exudate Type: [41R:red] [51:red, brown] [52:yellow, brown, green] Exudate Color: [41R:Distinct, outline attached] [51:[name]] [52:Distinct, outline attached] Wound Margin: [41R:Large (67-100%)] [51:[name]] [52:Large (67-100%)] Granulation [name] mount: [41R:Red, Friable] [51:[name]] [52:Red] Granulation Quality: [41R:None Present (0%)] [51:[name]] [52:Small (1-33%)] Necrotic [name] mount: [41R:Fat Layer (Subcutaneous Tissue): Yes [name]] [52:Fat Layer (Subcutaneous Tissue): Yes] Exposed  Structures: [41R:Fascia: No Tendon: No Muscle: No Joint: No Bone: No None] [51:[name]] [52:Fascia: No Tendon: No Muscle: No Joint: No Bone: No None] Epithelialization: [41R:[name]] [51:[name]] [52:[name]] Debridement: [41R:[name]] [51:[name]] [52:[name]] Pain Control: [41R:[name]] [51:[name]] [52:[name]] Tissue Debrided: [41R:[name]] [51:[name]] [52:[name]] Level: [41R:[name]] [51:[name]] [52:[name]] Debridement [name] (sq cm): [41R:rea [name]] [51:[name]] [52:[name]] Instrument: [41R:[name]] [51:[name]] [52:[name]] Bleeding: [41R:[name]] [51:[name]] [52:[name]] Hemostasis [name] chieved: [41R:[name]] [51:[name]] [52:[name]] Procedural Pain: [41R:[name]] [51:[name]] [52:[name]] Post Procedural Pain: Debridement Treatment Response: [name] [51:[name]] [52:[name]] Post Debridement Measurements L x [name] [51:[name]] [52:[name]] W x D (cm) [41R:[name]] [51:[name]] [52:[name]] Post Debridement Volume: (cm) [41R:[name]] [51:[name]] [52:[name]] Post Debridement Stage: [41R:[name]] [51:[name]] [52:[name]] Wound Number: [id number] Photos: No Photos No Photos No Photos Left T Great oe Left, Dorsal Foot Right, Plantar Foot Wound Location: Blister Gradually Appeared Gradually Appeared Wounding Event: 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 Primary Etiology: [name] Sleep Apnea, Hypertension, Paraplegia Sleep Apnea, Hypertension, Paraplegia Comorbid History: 05/26/2021 07/11/2021 08/25/2021 Date Acquired: 23 15 10  Weeks of Treatment: Healed - Epithelialized Open Open Wound Status: No No No Wound Recurrence: No No No Clustered Wound: [name] [name] [name] Clustered Quantity: 0x0x0 2.5x1.6x0.1 0.2x0.2x0.2 Measurements L x W x D (cm) 0 3.142 0.031 [name] (cm) : rea 0 0.314 0.006 Volume (cm) : 100.00% 49.50% 97.00% % Reduction in [name] rea: 100.00% 49.50% 94.10% % Reduction in Volume: Full Thickness With Exposed Support Full Thickness Without Exposed Full Thickness Without Exposed Classification: Structures Support Structures Support Structures Medium Medium Medium Exudate  Amount: Serosanguineous Purulent Purulent Exudate Type: red, brown yellow, brown, green yellow, brown, green Exudate Color: [name] Distinct, outline attached Distinct, outline attached Wound Margin: [name] Large (67-100%) Medium (34-66%) Granulation Amount: [name] Red Red Granulation Quality: [name] Small (1-33%) Medium (34-66%) Necrotic Amount: [name] Fat Layer (Subcutaneous Tissue): Yes Fat Layer (Subcutaneous Tissue): Yes Exposed Structures: Fascia: No Fascia: No Tendon: No Tendon: No Muscle: No Muscle: No Joint: No Joint: No Bone: No Bone: No [name] Small (1-33%) Small (1-33%) Epithelialization: [name] Debridement - Excisional [name] Debridement: [name] 08:40 [name] Pre-procedure Verification/Time Out Taken: [name] Lidocaine [name] Pain Control: [name] Subcutaneous, Slough [name] Tissue Debrided: [name] Skin/Subcutaneous Tissue [name] Level: [name] 4 [name] Debridement [name] (sq cm): rea [name] Curette [name] Instrument: [name] Minimum [name] Bleeding: [name] Pressure [name] Hemostasis [name] chieved: [name] 0 [name] Procedural Pain: [name] 0 [name] Post Procedural Pain: [name] Procedure was tolerated well [name] Debridement Treatment Response: [name] 2.5x1.6x0.1 [name] Post Debridement Measurements L x W x D (cm) [name] 0.314 [name] Post Debridement Volume: (cm) [name] [name] [name] Post Debridement Stage: [name] Debridement [name] Procedures Performed: Wound Number: [id number] [name] Photos: No Photos No Photos [name] Right, Lateral Lower Leg Left, Proximal, Dorsal Foot [name] Wound Location: Gradually Appeared Pressure Injury [name] Wounding Event: Trauma, Other Pressure Ulcer [name] Primary Etiology: [name] Sleep Apnea, Hypertension,  Paraplegia [name] Comorbid History: 09/23/2021 11/03/2021 [name] Date Acquired: 4 0 [name] Weeks of Treatment: Healed - Epithelialized Open [name] Wound Status: No No [name] Wound Recurrence: No No [name] Clustered Wound: [name] [name] [name] Clustered Quantity: 0x0x0 1.1x0.9x0.1 [name] Measurements L x W x D (cm) 0 0.778 [name] [name] (cm) : rea 0 0.078 [name] Volume (cm)  : 100.00% [name] [name] % Reduction in [name] rea: 100.00% [name] [name] % Reduction in Volume: Full Thickness Without Exposed Category/Stage II [name] Classification: Support Structures Medium Medium [name] Exudate Amount: Serosanguineous Serosanguineous [name] Exudate Type: red, brown red, brown [name] Exudate Color: [name] Distinct, outline attached [name] Wound Margin: [name] Medium (34-66%) [name] Granulation Amount: [name] Red, Pink [name] Granulation Quality: [name] Medium (34-66%) [name] Necrotic Amount: [name] Fascia: No [name] Exposed Structures: Fat Layer (Subcutaneous Tissue): No Tendon: No Muscle: No Joint: No Bone: No [name] None [name] Epithelialization: [name] Debridement - Excisional [name] Debridement: [name] 08:40 [name] Pre-procedure Verification/Time Out Taken: [name] Lidocaine [name] Pain Control: [name] Subcutaneous, Slough [name] Tissue Debrided: [name] Skin/Subcutaneous Tissue [name] Level: [name] 0.99 [name] Debridement [name] (sq cm): rea [name] Curette [name] Instrument: [name] Minimum [name] Bleeding: [name] Pressure [name] Hemostasis [name] chieved: [name] 0 [name] Procedural Pain: [name] 0 [name] Post Procedural Pain: [name] Procedure was tolerated well [name] Debridement Treatment Response: [name] 1.1x0.9x0.1 [name] Post Debridement Measurements L x W x D (cm) [name] 0.078 [name] Post Debridement Volume: (cm) [name] Category/Stage II [name] Post Debridement Stage: [name] Debridement [name] Procedures Performed: Treatment Notes Wound #[id number] (Ischium) Wound Laterality: Left Cleanser Soap and Water Discharge Instruction: May shower and wash wound with dial antibacterial soap and water prior to dressing change. Peri-Wound Care Triad Hydrophilic Wound Dressing Tube, 6 (oz) Discharge Instruction: Apply to periwound with each dressing change Topical Primary Dressing Hydrofera Blue Ready Foam, 2.5 x2.5 in Discharge Instruction: Apply to wound bed as instructed Secondary Dressing ABD Pad, 5x9 Discharge Instruction: Apply over primary dressing as directed. Secured With 41M Medipore H  Soft Cloth Surgical T 4 x 2 (in/yd) ape Discharge Instruction: Secure dressing with tape as directed. Compression Wrap Compression Stockings Add-Ons Wound #51 (Lower Leg) Wound Laterality: Left, Anterior Cleanser Peri-Wound Care Topical Primary Dressing Secondary Dressing Secured With Compression Wrap Compression Stockings Add-Ons Wound #52 (Foot) Wound Laterality: Dorsal, Right Cleanser Soap and Water Discharge Instruction: May shower and wash wound with dial antibacterial soap and water prior to dressing change. Peri-Wound Care Topical Gentamicin Discharge Instruction: As directed by physician Primary Dressing Hydrofera Blue Ready Foam, 2.5 x2.5 in Discharge Instruction: Apply to wound bed as instructed Secondary Dressing Woven Gauze Sponge, Non-Sterile 4x4 in Discharge Instruction: Apply over primary dressing as directed. Secured With [employer], 4.5x3.1 (in/yd) Discharge Instruction: Secure with Kerlix as directed. Transpore Surgical Tape, 2x10 (in/yd) Discharge Instruction: Secure dressing with tape as directed. Compression Wrap Compression Stockings Add-Ons Wound #55 (Toe Great) Wound Laterality: Left Cleanser Peri-Wound Care Topical Primary Dressing Secondary Dressing Secured With Compression Wrap Compression Stockings Add-Ons Wound #56 (Foot) Wound Laterality: Dorsal, Left Cleanser Soap and Water Discharge Instruction: May shower and wash wound with dial antibacterial soap and water prior to dressing change. Peri-Wound Care Topical Gentamicin Discharge Instruction: Apply directly to wound bed, under alginate Primary Dressing Hydrofera Blue Classic Foam, 4x4 in Discharge Instruction: Moisten with saline prior to applying to wound bed Secondary Dressing Woven Gauze Sponge, Non-Sterile 4x4 in Discharge Instruction: Apply over primary dressing as directed. Secured With [employer], 4.5x3.1 (in/yd) Discharge Instruction: Secure with  Kerlix as directed. Transpore Surgical  Tape, 2x10 (in/yd) Discharge Instruction: Secure dressing with tape as directed. Compression Wrap Compression Stockings Add-Ons Wound #57 (Foot) Wound Laterality: Plantar, Right Cleanser Soap and Water Discharge Instruction: May shower and wash wound with dial antibacterial soap and water prior to dressing change. Peri-Wound Care Topical Primary Dressing Hydrofera Blue Ready Foam, 2.5 x2.5 in Discharge Instruction: Apply to wound bed as instructed Secondary Dressing Woven Gauze Sponge, Non-Sterile 4x4 in Discharge Instruction: Apply over primary dressing as directed. Secured With [employer], 4.5x3.1 (in/yd) Discharge Instruction: Secure with Kerlix as directed. 211M Medipore H Soft Cloth Surgical T 4 x 2 (in/yd) ape Discharge Instruction: Secure dressing with tape as directed. Compression Wrap Compression Stockings Add-Ons Wound #58 (Lower Leg) Wound Laterality: Right, Lateral Cleanser Peri-Wound Care Topical Primary Dressing Secondary Dressing Secured With Compression Wrap Compression Stockings Add-Ons Wound #59 (Foot) Wound Laterality: Dorsal, Left, Proximal Cleanser Soap and Water Discharge Instruction: May shower and wash wound with dial antibacterial soap and water prior to dressing change. Peri-Wound Care Topical Primary Dressing Hydrofera Blue Ready Foam, 2.5 x2.5 in Discharge Instruction: Apply to wound bed as instructed Secondary Dressing Woven Gauze Sponge, Non-Sterile 4x4 in Discharge Instruction: Apply over primary dressing as directed. Secured With [employer], 4.5x3.1 (in/yd) Discharge Instruction: Secure with Kerlix as directed. 211M Medipore H Soft Cloth Surgical T 4 x 2 (in/yd) ape Discharge Instruction: Secure dressing with tape as directed. Compression Wrap Compression Stockings Add-Ons Electronic Signature(s) Signed: 11/03/2021 4:30:32 PM By: Linton Ham MD Signed: 11/03/2021  4:51:20 PM By: Levan Hurst RN, BSN Entered By: Linton Ham on 11/03/2021 08:50:41 -------------------------------------------------------------------------------- Multi-Disciplinary Care Plan Details Patient Name: Date of Service: [name], [name] 11/03/2021 8:00 [name] Medical Record Number: [id number] Patient Account Number: [id number] Date of Birth/Sex: Treating RN: Jun 01, 1986 (35 y.o. [name] Primary Care Rashema Seawright: [name], [name] Other Clinician: Referring Shalon Salado: Treating Nattalie Santiesteban/Extender: [name]: [place] reviewed with physician Active Inactive Wound/Skin Impairment Nursing Diagnoses: Impaired tissue integrity Knowledge deficit related to ulceration/compromised skin integrity Goals: Patient/caregiver will verbalize understanding of skin care regimen Date Initiated: 01/05/2016 Target Resolution Date: 11/20/2021 Goal Status: Active Ulcer/skin breakdown will have [name] volume reduction of 30% by week 4 Date Initiated: 01/05/2016 Date Inactivated: 12/22/2017 Target Resolution Date: 01/19/2018 Unmet Reason: complex wounds, Goal Status: Unmet infection Interventions: Assess patient/caregiver ability to obtain necessary supplies Assess ulceration(s) every visit Provide education on ulcer and skin care Notes: 02/02/21: Complex Care, ongoing. Electronic Signature(s) Signed: 11/03/2021 4:54:55 PM By: Rhae Hammock RN Entered By: Rhae Hammock on 11/03/2021 08:48:05 -------------------------------------------------------------------------------- Pain Assessment Details Patient Name: Date of Service: [name], [name] 11/03/2021 8:00 [name] Medical Record Number: [id number] Patient Account Number: [id number] Date of Birth/Sex: Treating RN: January 24, 1986 (35 y.o. [name] Primary Care Jailey Booton: [place], [place] Other Clinician: Referring Aditi Rovira: Treating Prescott Truex/Extender: [name]: 304 Active Problems Location of Pain Severity and Description of Pain Patient Has Paino No Site Locations Pain Management and Medication Current Pain Management: Electronic Signature(s) Signed: 11/03/2021 4:54:55 PM By: Rhae Hammock RN Entered By: Rhae Hammock on 11/03/2021 08:12:39 -------------------------------------------------------------------------------- Patient/Caregiver Education Details Patient Name: Date of Service: Corriher, [name] 12/13/2022andnbsp8:00 [name] Medical Record Number: [id number] Patient Account Number: [id number] Date of Birth/Gender: Treating RN: 1986-05-31 (35 y.o. [name] Primary Care Physician: Janine Limbo Other Clinician: Referring Physician: Treating Physician/Extender: [name]: [phone] Education Assessment Education Provided To: Patient Education Topics Provided  Wound/Skin Impairment: Methods: Explain/Verbal Responses: State content correctly Electronic Signature(s) Signed: 11/03/2021 4:54:55 PM By: Rhae Hammock RN Entered By: Rhae Hammock on 11/03/2021 08:29:34 -------------------------------------------------------------------------------- Wound Assessment Details Patient Name: Date of Service: [name], [name] 11/03/2021 8:00 [name] Medical Record Number: [id number] Patient Account Number: [id number] Date of Birth/Sex: Treating RN: 1986/10/30 (35 y.o. [name] Primary Care Tahir Blank: [name], [name] Other Clinician: Referring Beena Catano: Treating Caylin Nass/Extender: [name]: 304 Wound Status Wound Number: [id number] Primary Etiology: Pressure Ulcer Wound Location: Left Ischium Wound Status: Open Wounding Event: Gradually Appeared Comorbid History: Sleep Apnea, Hypertension, Paraplegia Date Acquired: 03/16/2020 Weeks Of Treatment: 85 Clustered Wound: Yes Wound  Measurements Length: (cm) 1.5 Width: (cm) 0.4 Depth: (cm) 0.1 Clustered Quantity: 2 Area: (cm) 0.471 Volume: (cm) 0.047 % Reduction in Area: 73.9% % Reduction in Volume: 74% Epithelialization: None Tunneling: No Undermining: No Wound Description Classification: Category/Stage II Wound Margin: Distinct, outline attached Exudate Amount: Medium Exudate Type: Sanguinous Exudate Color: red Foul Odor After Cleansing: No Slough/Fibrino No Wound Bed Granulation Amount: Large (67-100%) Exposed Structure Granulation Quality: Red, Friable Fascia Exposed: No Necrotic Amount: None Present (0%) Fat Layer (Subcutaneous Tissue) Exposed: Yes Tendon Exposed: No Muscle Exposed: No Joint Exposed: No Bone Exposed: No Treatment Notes Wound #[id number] (Ischium) Wound Laterality: Left Cleanser Soap and Water Discharge Instruction: May shower and wash wound with dial antibacterial soap and water prior to dressing change. Peri-Wound Care Triad Hydrophilic Wound Dressing Tube, 6 (oz) Discharge Instruction: Apply to periwound with each dressing change Topical Primary Dressing Hydrofera Blue Ready Foam, 2.5 x2.5 in Discharge Instruction: Apply to wound bed as instructed Secondary Dressing ABD Pad, 5x9 Discharge Instruction: Apply over primary dressing as directed. Secured With 50M Medipore H Soft Cloth Surgical T 4 x 2 (in/yd) ape Discharge Instruction: Secure dressing with tape as directed. Compression Wrap Compression Stockings Add-Ons Electronic Signature(s) Signed: 11/03/2021 4:54:55 PM By: Rhae Hammock RN Entered By: Rhae Hammock on 11/03/2021 08:24:08 -------------------------------------------------------------------------------- Wound Assessment Details Patient Name: Date of Service: [name], [name] 11/03/2021 8:00 [name] Medical Record Number: [id number] Patient Account Number: [id number] Date of Birth/Sex: Treating RN: 1986/07/29 (35 y.o. [name] Primary Care  Sherron Mummert: [name], [name] Other Clinician: Referring Omar Orrego: Treating Jessyka Austria/Extender: [name]: 304 Wound Status Wound Number: 41 Primary Etiology: Trauma, Other Wound Location: Left, Anterior Lower Leg Wound Status: Healed - Epithelialized Wounding Event: Trauma Date Acquired: 01/26/2021 Weeks Of Treatment: 37 Clustered Wound: No Wound Measurements Length: (cm) Width: (cm) Depth: (cm) Area: (cm) Volume: (cm) 0 % Reduction in Area: 100% 0 % Reduction in Volume: 100% 0 0 0 Wound Description Classification: Full Thickness Without Exposed Support Structu Exudate Amount: Medium Exudate Type: Serosanguineous Exudate Color: red, brown res Treatment Notes Wound #51 (Lower Leg) Wound Laterality: Left, Anterior Cleanser Peri-Wound Care Topical Primary Dressing Secondary Dressing Secured With Compression Wrap Compression Stockings Add-Ons Electronic Signature(s) Signed: 11/03/2021 4:54:55 PM By: Rhae Hammock RN Entered By: Rhae Hammock on 11/03/2021 08:23:26 -------------------------------------------------------------------------------- Wound Assessment Details Patient Name: Date of Service: [name], [name] 11/03/2021 8:00 [name] Medical Record Number: [id number] Patient Account Number: [id number] Date of Birth/Sex: Treating RN: 1985-12-02 (35 y.o. [name] Primary Care Annetta Deiss: [place], [place] Other Clinician: Referring Anddy Wingert: Treating Irwin Toran/Extender: [name]: 304 Wound Status Wound Number: 52 Primary Etiology: Trauma, Other Wound Location: Right, Dorsal Foot Wound Status: Open Wounding Event: Gradually Appeared Comorbid History: Sleep Apnea, Hypertension, Paraplegia Date  Acquired: 03/27/2021 Weeks Of Treatment: 31 Clustered Wound: No Wound Measurements Length: (cm) 1 Width: (cm) 4 Depth: (cm) 0.1 Area: (cm) 3.142 Volume: (cm) 0.314 % Reduction  in Area: -66.7% % Reduction in Volume: -67% Epithelialization: None Tunneling: No Undermining: No Wound Description Classification: Full Thickness Without Exposed Support Structures Wound Margin: Distinct, outline attached Exudate Amount: Medium Exudate Type: Purulent Exudate Color: yellow, brown, green Foul Odor After Cleansing: No Slough/Fibrino Yes Wound Bed Granulation Amount: Large (67-100%) Exposed Structure Granulation Quality: Red Fascia Exposed: No Necrotic Amount: Small (1-33%) Fat Layer (Subcutaneous Tissue) Exposed: Yes Necrotic Quality: Adherent Slough Tendon Exposed: No Muscle Exposed: No Joint Exposed: No Bone Exposed: No Treatment Notes Wound #52 (Foot) Wound Laterality: Dorsal, Right Cleanser Soap and Water Discharge Instruction: May shower and wash wound with dial antibacterial soap and water prior to dressing change. Peri-Wound Care Topical Primary Dressing Hydrofera Blue Ready Foam, 2.5 x2.5 in Discharge Instruction: Apply to wound bed as instructed Secondary Dressing Woven Gauze Sponge, Non-Sterile 4x4 in Discharge Instruction: Apply over primary dressing as directed. Secured With [employer], 4.5x3.1 (in/yd) Discharge Instruction: Secure with Kerlix as directed. Transpore Surgical Tape, 2x10 (in/yd) Discharge Instruction: Secure dressing with tape as directed. Compression Wrap Compression Stockings Add-Ons Electronic Signature(s) Signed: 11/03/2021 4:54:55 PM By: Rhae Hammock RN Entered By: Rhae Hammock on 11/03/2021 08:23:03 -------------------------------------------------------------------------------- Wound Assessment Details Patient Name: Date of Service: [name], [name] 11/03/2021 8:00 [name] Medical Record Number: [id number] Patient Account Number: [id number] Date of Birth/Sex: Treating RN: July 29, 1986 (35 y.o. [name] Primary Care Nicolas Banh: [name], [name] Other Clinician: Referring Lamaria Hildebrandt: Treating  Sharonann Malbrough/Extender: [name]: 304 Wound Status Wound Number: 62 Primary Etiology: Neuropathic Ulcer-Non Diabetic Wound Location: Left T Great oe Wound Status: Healed - Epithelialized Wounding Event: Blister Date Acquired: 05/26/2021 Weeks Of Treatment: 23 Clustered Wound: No Wound Measurements Length: (cm) Width: (cm) Depth: (cm) Area: (cm) Volume: (cm) 0 % Reduction in Area: 100% 0 % Reduction in Volume: 100% 0 0 0 Wound Description Classification: Full Thickness With Exposed Support Structure Exudate Amount: Medium Exudate Type: Serosanguineous Exudate Color: red, brown s Treatment Notes Wound #55 (Toe Great) Wound Laterality: Left Cleanser Peri-Wound Care Topical Primary Dressing Secondary Dressing Secured With Compression Wrap Compression Stockings Add-Ons Electronic Signature(s) Signed: 11/03/2021 4:54:55 PM By: Rhae Hammock RN Entered By: Rhae Hammock on 11/03/2021 08:22:25 -------------------------------------------------------------------------------- Wound Assessment Details Patient Name: Date of Service: [name], [name] 11/03/2021 8:00 [name] Medical Record Number: [id number] Patient Account Number: [id number] Date of Birth/Sex: Treating RN: 31-Oct-1986 (35 y.o. [name] Primary Care Rehaan Viloria: [name], [name] Other Clinician: Referring Ardie Mclennan: Treating Amillion Scobee/Extender: [name]: 304 Wound Status Wound Number: 56 Primary Etiology: Neuropathic Ulcer-Non Diabetic Wound Location: Left, Dorsal Foot Wound Status: Open Wounding Event: Gradually Appeared Comorbid History: Sleep Apnea, Hypertension, Paraplegia Date Acquired: 07/11/2021 Weeks Of Treatment: 15 Clustered Wound: No Wound Measurements Length: (cm) 2.5 Width: (cm) 1.6 Depth: (cm) 0.1 Area: (cm) 3.142 Volume: (cm) 0.314 % Reduction in Area: 49.5% % Reduction in Volume:  49.5% Epithelialization: Small (1-33%) Tunneling: No Undermining: No Wound Description Classification: Full Thickness Without Exposed Support Structures Wound Margin: Distinct, outline attached Exudate Amount: Medium Exudate Type: Purulent Exudate Color: yellow, brown, green Foul Odor After Cleansing: No Slough/Fibrino Yes Wound Bed Granulation Amount: Large (67-100%) Exposed Structure Granulation Quality: Red Fascia Exposed: No Necrotic Amount: Small (1-33%) Fat Layer (Subcutaneous Tissue) Exposed: Yes Necrotic Quality: Adherent Slough Tendon Exposed: No Muscle  Exposed: No Joint Exposed: No Bone Exposed: No Treatment Notes Wound #56 (Foot) Wound Laterality: Dorsal, Left Cleanser Soap and Water Discharge Instruction: May shower and wash wound with dial antibacterial soap and water prior to dressing change. Peri-Wound Care Topical Primary Dressing Hydrofera Blue Classic Foam, 4x4 in Discharge Instruction: Moisten with saline prior to applying to wound bed Secondary Dressing Woven Gauze Sponge, Non-Sterile 4x4 in Discharge Instruction: Apply over primary dressing as directed. Secured With [employer], 4.5x3.1 (in/yd) Discharge Instruction: Secure with Kerlix as directed. Transpore Surgical Tape, 2x10 (in/yd) Discharge Instruction: Secure dressing with tape as directed. Compression Wrap Compression Stockings Add-Ons Electronic Signature(s) Signed: 11/03/2021 4:54:55 PM By: Rhae Hammock RN Entered By: Rhae Hammock on 11/03/2021 08:22:02 -------------------------------------------------------------------------------- Wound Assessment Details Patient Name: Date of Service: [place], [name] 11/03/2021 8:00 [name] Medical Record Number: [id number] Patient Account Number: [id number] Date of Birth/Sex: Treating RN: 12-Jan-1986 (35 y.o. [name] Primary Care Ramata Strothman: [name], [name] Other Clinician: Referring Arianah Torgeson: Treating Delara Shepheard/Extender:  [name]: 304 Wound Status Wound Number: 57 Primary Etiology: Neuropathic Ulcer-Non Diabetic Wound Location: Right, Plantar Foot Wound Status: Open Wounding Event: Gradually Appeared Comorbid History: Sleep Apnea, Hypertension, Paraplegia Date Acquired: 08/25/2021 Weeks Of Treatment: 10 Clustered Wound: No Wound Measurements Length: (cm) 0.2 Width: (cm) 0.2 Depth: (cm) 0.2 Area: (cm) 0.031 Volume: (cm) 0.006 % Reduction in Area: 97% % Reduction in Volume: 94.1% Epithelialization: Small (1-33%) Tunneling: No Undermining: No Wound Description Classification: Full Thickness Without Exposed Support Structures Wound Margin: Distinct, outline attached Exudate Amount: Medium Exudate Type: Purulent Exudate Color: yellow, brown, green Foul Odor After Cleansing: No Slough/Fibrino Yes Wound Bed Granulation Amount: Medium (34-66%) Exposed Structure Granulation Quality: Red Fascia Exposed: No Necrotic Amount: Medium (34-66%) Fat Layer (Subcutaneous Tissue) Exposed: Yes Necrotic Quality: Adherent Slough Tendon Exposed: No Muscle Exposed: No Joint Exposed: No Bone Exposed: No Treatment Notes Wound #57 (Foot) Wound Laterality: Plantar, Right Cleanser Soap and Water Discharge Instruction: May shower and wash wound with dial antibacterial soap and water prior to dressing change. Peri-Wound Care Topical Primary Dressing Hydrofera Blue Ready Foam, 2.5 x2.5 in Discharge Instruction: Apply to wound bed as instructed Secondary Dressing Woven Gauze Sponge, Non-Sterile 4x4 in Discharge Instruction: Apply over primary dressing as directed. Secured With [employer], 4.5x3.1 (in/yd) Discharge Instruction: Secure with Kerlix as directed. 70M Medipore H Soft Cloth Surgical T 4 x 2 (in/yd) ape Discharge Instruction: Secure dressing with tape as directed. Compression Wrap Compression Stockings Add-Ons Electronic Signature(s) Signed:  11/03/2021 4:54:55 PM By: Rhae Hammock RN Entered By: Rhae Hammock on 11/03/2021 08:21:00 -------------------------------------------------------------------------------- Wound Assessment Details Patient Name: Date of Service: [place], [name] 11/03/2021 8:00 [name] Medical Record Number: [id number] Patient Account Number: [id number] Date of Birth/Sex: Treating RN: 05-08-1986 (35 y.o. [name] Primary Care Anshika Pethtel: [name], [name] Other Clinician: Referring Johne Buckle: Treating Fawn Desrocher/Extender: [name]: 304 Wound Status Wound Number: 58 Primary Etiology: Trauma, Other Wound Location: Right, Lateral Lower Leg Wound Status: Healed - Epithelialized Wounding Event: Gradually Appeared Date Acquired: 09/23/2021 Weeks Of Treatment: 4 Clustered Wound: No Wound Measurements Length: (cm) Width: (cm) Depth: (cm) Area: (cm) Volume: (cm) 0 % Reduction in Area: 100% 0 % Reduction in Volume: 100% 0 0 0 Wound Description Classification: Full Thickness Without Exposed Support Structu Exudate Amount: Medium Exudate Type: Serosanguineous Exudate Color: red, brown Treatment Notes Wound #58 (Lower Leg) Wound Laterality: Right, Lateral Cleanser Peri-Wound Care Topical Primary Dressing Secondary  Dressing Secured With Compression Wrap Compression Stockings Add-Ons Electronic Signature(s) Signed: 11/03/2021 4:54:55 PM By: Rhae Hammock RN Entered By: Freeman Caldron, Lauren on 11/03/2021 08:20:24 -------------------------------------------------------------------------------- Wound Assessment Details Patient Name: Date of Service: [place], [name] 11/03/2021 8:00 [name] Medical Record Number: [id number] Patient Account Number: [id number] Date of Birth/Sex: Treating RN: [birth year] (35 y.o. [name] Primary Care Luvern Mcisaac: [name], [name] Other Clinician: Referring Ethaniel Garfield: Treating Danea Manter/Extender: [name]: 304 Wound Status Wound Number: 59 Primary Etiology: Pressure Ulcer Wound Location: Left, Proximal, Dorsal Foot Wound Status: Open Wounding Event: Pressure Injury Comorbid History: Sleep Apnea, Hypertension, Paraplegia Date Acquired: 11/03/2021 Weeks Of Treatment: 0 Clustered Wound: No Wound Measurements Length: (cm) 1.1 Width: (cm) 0.9 Depth: (cm) 0.1 Area: (cm) 0.778 Volume: (cm) 0.078 % Reduction in Area: % Reduction in Volume: Epithelialization: None Tunneling: No Undermining: No Wound Description Classification: Category/Stage II Wound Margin: Distinct, outline attached Exudate Amount: Medium Exudate Type: Serosanguineous Exudate Color: red, brown Foul Odor After Cleansing: No Slough/Fibrino Yes Wound Bed Granulation Amount: Medium (34-66%) Exposed Structure Granulation Quality: Red, Pink Fascia Exposed: No Necrotic Amount: Medium (34-66%) Fat Layer (Subcutaneous Tissue) Exposed: No Necrotic Quality: Adherent Slough Tendon Exposed: No Muscle Exposed: No Joint Exposed: No Bone Exposed: No Treatment Notes Wound #59 (Foot) Wound Laterality: Dorsal, Left, Proximal Cleanser Soap and Water Discharge Instruction: May shower and wash wound with dial antibacterial soap and water prior to dressing change. Peri-Wound Care Topical Primary Dressing Hydrofera Blue Ready Foam, 2.5 x2.5 in Discharge Instruction: Apply to wound bed as instructed Secondary Dressing Woven Gauze Sponge, Non-Sterile 4x4 in Discharge Instruction: Apply over primary dressing as directed. Secured With [employer], 4.5x3.1 (in/yd) Discharge Instruction: Secure with Kerlix as directed. 69M Medipore H Soft Cloth Surgical T 4 x 2 (in/yd) ape Discharge Instruction: Secure dressing with tape as directed. Compression Wrap Compression Stockings Add-Ons Electronic Signature(s) Signed: 11/03/2021 4:54:55 PM By: Rhae Hammock RN Entered By:  Rhae Hammock on 11/03/2021 08:19:57 -------------------------------------------------------------------------------- Vitals Details Patient Name: Date of Service: [place], [name] 11/03/2021 8:00 [name] Medical Record Number: [id number] Patient Account Number: [id number] Date of Birth/Sex: Treating RN: 07-13-1986 (35 y.o. [name] Primary Care Dianne Whelchel: [place], [place] Other Clinician: Referring Tekoa Amon: Treating Amor Hyle/Extender: [name]: 304 Vital Signs Time Taken: 08:12 Temperature (F): 98.3 Height (in): 70 Pulse (bpm): 137 Weight (lbs): 216 Respiratory Rate (breaths/min): 17 Body Mass Index (BMI): 31 Blood Pressure (mmHg): 133/74 Reference Range: 80 - 120 mg / dl Electronic Signature(s) Signed: 11/03/2021 4:54:55 PM By: Rhae Hammock RN Entered By: Rhae Hammock on 11/03/2021 08:12:31

## 2021-11-03 NOTE — Progress Notes (Signed)
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 debridement indicated. Continue Hydrofera Blue difficult areas between his toes first and third on the left than first and second on the right. I'm going to change to silver alginate from silver collagen. Continue ketoconazole as I suspect underlying tinea pedis 05/26/17; left lateral leg venous insufficiency wound. We've been using Hydrofera Blue. I believe that there is expanding epithelialization over the surface of the wound albeit not coming from the wound circumference. This is [name] bit of an odd situation in which the epithelialization seems to be coming from the surface of the wound rather than in the exact circumference. There is still small open areas mostly along the lateral margin of the wound. He has unchanged areas between the left first and second and the right first second toes which I been treating for tenia pedis 06/02/17; left lateral leg venous insufficiency wound. We have been using Hydrofera Blue. Somewhat smaller from the wound circumference. The surface of the wound remains [name] bit on it almost epithelialized sedation in appearance. I use an open curette today debridement in the surface of all of this especially the edges Small open wounds remaining on the dorsal right first and second toe interspace and the plantar left first second toe and her face on the left 06/09/17; wound on the left lateral leg continues to be smaller but very gradual and very dry surface using Hydrofera Blue 06/16/17 requires weekly  debridements now on the left lateral leg although this continues to contract. I changed to silver collagen last week because of  dryness of the wound bed. Using Iodoflex to the areas on his first and second toes/web space bilaterally 06/24/17; patient with history of paraplegia also chronic venous insufficiency with lymphedema. Has [name] very difficult wound on the left lateral leg. This has been gradually reducing in terms of with but comes in with [name] very dry adherent surface. High switch to silver collagen [name] week or so ago with hydrogel to keep the area moist. This is been refractory to multiple dressing attempts. He also has areas in his first and second toes bilaterally in the anterior and posterior web space. I had been using Iodoflex here after [name] prolonged course of silver alginate with ketoconazole was ineffective [question tinea pedis] 07/14/17; patient arrives today with [name] very difficult adherent material over his left lateral lower leg wound. He also has surrounding erythema and poorly controlled edema. He was switched his Santyl last visit which the nurses are applying once during his doctor visit and once on [name] nurse visit. He was also reduced to 2 layer compression I'm not exactly sure of the issue here. 07/21/17; better surface today after 1 week of Iodoflex. Significant cellulitis that we treated last week also better. [Doxycycline] 07/28/17 better surface today with now 2 weeks of Iodoflex. Significant cellulitis treated with doxycycline. He has now completed the doxycycline and he is back to his usual degree of chronic venous inflammation/stasis dermatitis. He reminds me he has had ablations surgery here 08/04/17; continued improvement with Iodoflex to the left lateral leg wound in terms of the surface of the wound although the dimensions are better. 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 Problematic areas on the plantar  aspect of the first second toe web space on the left and the dorsal aspect of the first second toe web space on the right. At one point I felt these were probably related to chronic fungal infections in treated him aggressively for this although we have not made any improvement here. 08/11/17; left lateral leg. Surface continues to improve with the Iodoflex although we are not seeing much improvement in overall wound dimensions. Areas on his plantar left foot and right foot show no improvement. In fact the right foot looks somewhat worse 08/18/17; left lateral leg. We changed to [facility] Blue last week after [name] prolonged course of Iodoflex which helps get the surface better. It appears that the wound with is improved. Continue with difficult areas on the left dorsal first second and plantar first second on the right 09/01/17; patient arrives in clinic today having had [name] temperature of 103 yesterday. He was seen in the ER and [facility]. The patient was concerned he could have cellulitis again in the right leg however they diagnosed him with [name] UTI and he is now on Keflex. He has [name] history of cellulitis which is been recurrent and difficult but this is been in the left leg, in the past 5 use doxycycline. He does in and out catheterizations at home which are risk factors for UTI 09/08/17; patient will be completing his Keflex this weekend. The erythema on the left leg is considerably better. He has [name] new wound today on the medial part of the right leg small superficial almost looks like [name] skin tear. He has worsening of the area on the right dorsal first and second toe. His major area on the left lateral leg is better. Using Hydrofera Blue on all areas 09/15/17; gradual reduction in width on  the long wound in the left lateral leg. No debridement required. He also has wounds on the plantar aspect of his left first second toe web space and on the dorsal aspect of the right first second toe web space. 09/22/17;  there continues to be very gradual improvements in the dimensions of the left lateral leg wound. He hasn't round erythematous spot with might be pressure on his wheelchair. There is no evidence obviously of infection no purulence no warmth He has [name] dry scaled area on the plantar aspect of the left first second toe Improved area on the dorsal right first second toe. 09/29/17; left lateral leg wound continues to improve in dimensions mostly with an is still [name] fairly long but increasingly narrow wound. He has [name] dry scaled area on the plantar aspect of his left first second toe web space Increasingly concerning area on the dorsal right first second toe. In fact I am concerned today about possible cellulitis around this wound. The areas extending up his second toe and although there is deformities here almost appears to abut on the nailbed. 10/06/17; left lateral leg wound continues to make very gradual progress. Tissue culture I did from the right first second toe dorsal foot last time grew MRSA and enterococcus which was vancomycin sensitive. This was not sensitive to clindamycin or doxycycline. He is allergic to Zyvox and sulfa we have therefore arrange for him to have dalvance infusion tomorrow. He is had this in the past and tolerated it well 10/20/17; left lateral leg wound continues to make decent progress. This is certainly reduced in terms of with there is advancing epithelialization.The cellulitis in the right foot looks better although he still has [name] deep wound in the dorsal aspect of the first second toe web space. Plantar left first toe web space on the left I think is making some progress 10/27/17; left lateral leg wound continues to make decent progress. Advancing epithelialization.using Hydrofera Blue The right first second toe web space wound is better-looking using silver alginate Improvement in the left plantar first second toe web space. Again using silver alginate 11/03/17 left lateral  leg wound continues to make decent progress albeit slowly. Using [facility] The right per second toe web space continues to be [name] very problematic looking punched out wound. I obtained [name] piece of tissue for deep culture I did extensively treated this for fungus. It is difficult to imagine that this is [name] pressure area as the patient states other than going outside he doesn't really wear shoes at home The left plantar first second toe web space looked fairly senescent. Necrotic edges. This required debridement change to [facility] Blue to all wound areas 11/10/17; left lateral leg wound continues to contract. Using Hydrofera Blue On the right dorsal first second toe web space dorsally. Culture I did of this area last week grew MRSA there is not an easy oral option in this patient was multiple antibiotic allergies or intolerances. This was only [name] rare culture isolate I'm therefore going to use Bactroban under silver alginate On the left plantar first second toe web space. Debridement is required here. This is also unchanged 11/17/17; left lateral leg wound continues to contract using Hydrofera Blue this is no longer the major issue. The major concern here is the right first second toe web space. He now has an open area going from dorsally to the plantar aspect. There is now wound on the inner lateral part of the first toe. Not [name] very viable  surface on this. There is erythema spreading medially into the forefoot. No major change in the left first second toe plantar wound 11/24/17; left lateral leg wound continues to contract using Hydrofera Blue. Nice improvement today The right first second toe web space all of this looks [name] lot less angry than last week. I have given him clindamycin and topical Bactroban for MRSA and terbinafine for the possibility of underlining tinea pedis that I could not control with ketoconazole. Looks somewhat better The area on the plantar left first second toe web space is  weeping with dried debris around the wound 12/01/17; left lateral leg wound continues to contract he Hydrofera Blue. It is becoming thinner in terms of with nevertheless it is making good improvement. The right first second toe web space looks less angry but still [name] large necrotic-looking wounds starting on the plantar aspect of the right foot extending between the toes and now extensively on the base of the right second toe. I gave him clindamycin and topical Bactroban for MRSA anterior benefiting for the possibility of underlying tinea pedis. Not looking better today The area on the left first/second toe looks better. Debrided of necrotic debris 12/05/17* the patient was worked in urgently today because over the weekend he found blood on his incontinence bad when he woke up. He was found to have an ulcer by his wife who does most of his wound care. He came in today for [place] to look at this. He has not had [name] history of wounds in his buttocks in spite of his paraplegia. 12/08/17; seen in follow-up today at his usual appointment. He was seen earlier this week and found to have [name] new wound on his buttock. We also follow him for wounds on the left lateral leg, left first second toe web space and right first second toe web space 12/15/17; we have been using Hydrofera Blue to the left lateral leg which has improved. The right first second toe web space has also improved. Left first second toe web space plantar aspect looks stable. The left buttock has worsened using Santyl. Apparently the buttock has drainage 12/22/17; we have been using Hydrofera Blue to the left lateral leg which continues to improve now 2 small wounds separated by normal skin. He tells [place] he had [name] fever up to 100 yesterday he is prone to UTIs but has not noted anything different. He does in and out catheterizations. The area between the first and second toes today does not look good necrotic surface covered with what looks to be purulent  drainage and erythema extending into the third toe. I had gotten this to something that I thought look better last time however it is not look good today. He also has [name] necrotic surface over the buttock wound which is expanded. I thought there might be infection under here so I removed [name] lot of the surface with [name] #5 curet though nothing look like it really needed culturing. He is been using Santyl to this area 12/27/17; his original wound on the left lateral leg continues to improve using Hydrofera Blue. I gave him samples of Baxdella although he was unable to take them out of fear for an allergic reaction ["lump in his throat"].the culture I did of the purulent drainage from his second toe last week showed both enterococcus and [name] set Enterobacter I was also concerned about the erythema on the bottom of his foot although paradoxically although this looks somewhat better today. Finally his pressure  ulcer on the left buttock looks worse this is clearly now [name] stage III wound necrotic surface requiring debridement. We've been using silver alginate here. They came up today that he sleeps in [name] recliner, I'm not sure why but I asked him to stop this 01/03/18; his original wound we've been using Hydrofera Blue is now separated into 2 areas. Ulcer on his left buttock is better he is off the recliner and sleeping in bed Finally both wound areas between his first and second toes also looks some better 01/10/18; his original wound on the left lateral leg is now separated into 2 wounds we've been using Hydrofera Blue Ulcer on his left buttock has some drainage. There is [name] small probing site going into muscle layer superiorly.using silver alginate -He arrives today with [name] deep tissue injury on the left heel The wound on the dorsal aspect of his first second toe on the left looks [name] lot betterusing silver alginate ketoconazole The area on the first second toe web space on the right also looks [name] lot bette 01/17/18; his  original wound on the left lateral leg continues to progress using Hydrofera Blue Ulcer on his left buttock also is smaller surface healthier except for [name] small probing site going into the muscle layer superiorly. 2.4 cm of tunneling in this area DTI on his left heel we have only been offloading. Looks better than last week no threatened open no evidence of infection the wound on the dorsal aspect of the first second toe on the left continues to look like it's regressing we have only been using silver alginate and terbinafine orally The area in the first second toe web space on the right also looks to be [name] lot better using silver alginate and terbinafine I think this was prompted by tinea pedis 01/31/18; the patient was hospitalized in [place] last week apparently for [name] complicated UTI. He was discharged on cefepime he does in and out catheterizations. In the hospital he was discovered M I don't mild elevation of AST and ALT and the terbinafine was stopped.predictably the pressure ulcer on s his buttock looks betterusing silver alginate. The area on the left lateral leg also is better using Hydrofera Blue. The area between the first and second toes on the left better. First and second toes on the right still substantial but better. Finally the DTI on the left heel has held together and looks like it's resolving 02/07/18-he is here in follow-up evaluation for multiple ulcerations. He has new injury to the lateral aspect of the last issue [name] pressure ulcer, he states this is from adhesive removal trauma. He states he has tried multiple adhesive products with no success. All other ulcers appear stable. The left heel DTI is resolving. We will continue with same treatment plan and follow-up next week. 02/14/18; follow-up for multiple areas. He has [name] new area last week on the lateral aspect of his pressure ulcer more over the posterior trochanter. The original pressure ulcer looks quite stable has healthy  granulation. 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 Almost fully epithelialized on the original superior area using Hydrofera Blue DTI on the left heel has peeled off this week to reveal [name] small superficial wound under denuded skin and subcutaneous tissue Both areas between the first and second toes look better including nothing open on the left 02/21/18; The patient's wounds on his left ischial tuberosity  and posterior left greater trochanter actually looked better. He has [name] large area of irritation around the area which I think is contact dermatitis. I am doubtful that this is fungal His original wound on the left lateral calf continues to improve we have been using Hydrofera Blue There is no open area in the left first second toe web space although there is [name] lot of thick callus The DTI on the left heel required debridement today of necrotic surface eschar and subcutaneous tissue using silver alginate Finally the area on the right first second toe webspace continues to contract using silver alginate and ketoconazole 02/28/18 Left ischial tuberosity wounds look better using silver alginate. Original wound on the left calf only has one small open area left using Hydrofera Blue DTI on the left heel required debridement mostly removing skin from around this wound surface. Using silver alginate The areas on the right first/second toe web space using silver alginate and ketoconazole 03/08/18 on evaluation today patient appears to be doing decently well as best I can tell in regard to his wounds. This is the first time that I have seen him as he generally is followed by Dr. Dellia Nims. With that being said none of his wounds appear to be infected he does have an area where there is some skin covering what appears to be [name] new wound on the left dorsal surface of his great toe. This is right at the nail  bed. With that being said I do believe that debrided away some of the excess skin can be of benefit in this regard. Otherwise he has been tolerating the dressing changes without complication. 03/14/18; patient arrives today with the multiplicity of wounds that we are following. He has not been systemically unwell Original wound on the left lateral calf now only has 2 small open areas we've been using Hydrofera Blue which should continue The deep tissue injury on the left heel requires debridement today. We've been using silver alginate The left first second toe and the right first second toe are both are reminiscence what I think was tinea pedis. Apparently some of the callus Surface between the toes was removed last week when it started draining. Purulent drainage coming from the wound on the ischial tuberosity on the left. 03/21/18-He is here in follow-up evaluation for multiple wounds. 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 He tolerated debridement. The only change to last week's recommendations is to discontinue antifungal cream between toes. He will follow-up next week 03/28/18; following up for multiple wounds;Concern this week is streaking redness and swelling in the right foot. He is going to need antibiotics for this. 03/31/18; follow-up for right foot cellulitis. Streaking redness and swelling in the right foot on 03/28/18. 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 I put him on clindamycin 300 mg every 6 and brought him in for [name] quick check. He has an open wound between his first and second toes on the right foot as [name] potential source. 04/04/18; Right foot cellulitis is resolving he is completing clindamycin. This is truly good news Left lateral calf wound which is initial wound only has one small open area inferiorly this is close to healing out. He has compression stockings. We will  use Hydrofera Blue right down to the epithelialization of this Nonviable surface on the left heel which was initially pressure with [name] DTI. We've been using Hydrofera Blue. I'm going to switch this  back to silver alginate Left first second toe/tinea pedis this looks better using silver alginate Right first second toe tinea pedis using silver alginate Large pressure ulcers on theLeft ischial tuberosity. Small wound here Looks better. I am uncertain about the surface over the large wound. Using silver alginate 04/11/18; Cellulitis in the right foot is resolved Left lateral calf wound which was his original wounds still has 2 tiny open areas remaining this is just about closed Nonviable surface on the left heel is better but still requires debridement Left first second toe/tinea pedis still open using silver alginate Right first second toe wound tinea pedis I asked him to go back to using ketoconazole and silver alginate Large pressure ulcers on the left ischial tuberosity this shear injury here is resolved. Wound is smaller. No evidence of infection using silver alginate 04/18/18; Patient arrives with an intense area of cellulitis in the right mid lower calf extending into the right heel area. Bright red and warm. Smaller area on the left anterior leg. He has [name] significant history of MRSA. He will definitely need antibioticsdoxycycline He now has 2 open areas on the left ischial tuberosity the original large wound and now [name] satellite area which I think was above his initial satellite areas. Not [name] wonderful surface on this satellite area surrounding erythema which looks like pressure related. His left lateral calf wound again his original wound is just about closed Left heel pressure injury still requiring debridement Left first second toe looks [name] lot better using silver alginate Right first second toe also using silver alginate and ketoconazole cream also looks better 04/20/18; the patient was  worked in early today out of concerns with his cellulitis on the right leg. I had started him on doxycycline. This was 2 days ago. His wife was concerned about the swelling in the area. Also concerned about the left buttock. He has not been systemically unwell no fever chills. No nausea vomiting or diarrhea 04/25/18; the patient's left buttock wound is continued to deteriorate he is using Hydrofera Blue. He is still completing clindamycin for the cellulitis on the right leg although all of this looks better. 05/02/18 Left buttock wound still with [name] lot of drainage and [name] very tightly adherent fibrinous necrotic surface. He has [name] deeper area superiorly The left lateral calf wound is still closed DTI wound on the left heel necrotic surface especially the circumference using Iodoflex Areas between his left first second toe and right first second toe both look better. Dorsally and the right first second toe he had [name] necrotic surface although at smaller. In using silver alginate and ketoconazole. I did [name] culture last week which was [name] deep tissue culture of the reminiscence of the open wound on the right first second toe dorsally. This grew [name] few Acinetobacter and [name] few methicillin-resistant staph aureus. Nevertheless the area actually this week looked better. I didn't feel the need to specifically address this at least in terms of systemic antibiotics. 05/09/18; wounds are measuring larger more drainage per our intake. We are using Santyl covered with alginate on the large superficial buttock wounds, Iodosorb on the left heel, ketoconazole and silver alginate to the dorsal first and second toes bilaterally. 05/16/18; The area on his left buttock better in some aspects although the area superiorly over the ischial tuberosity required an extensive debridement.using Santyl Left heel appears stable. Using Iodoflex The areas between his first and second toes are not bad however there is spreading erythema up the  dorsal aspect of his left foot this looks like cellulitis again. He is insensate the erythema is really very brilliant.o Erysipelas He went to see an allergist days ago because he was itching part of this he had lab work done. This showed [name] white count of 15.1 with 70% neutrophils. Hemoglobin of 11.4 and [name] platelet count of 659,000. Last white count we had in Epic was [name] 2-1/2 years ago which was 25.9 but he was ill at the time. He was able to show me some lab work that was done by his primary physician the pattern is about the same. 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 But prompted me to go ahead and do x-rays of both feet and the pelvis rule out osteomyelitis. He also had [name] comprehensive metabolic panel this was reasonably normal his albumin was 3.7 liver function tests BUN/creatinine all normal 05/23/18; x-rays of both his feet from last week were negative for underlying pulmonary abnormality. The x-ray of his pelvis however showed mild irregularity in the left ischial which may represent some early osteomyelitis. The wound in the left ischial continues to get deeper clearly now exposed muscle. Each week necrotic surface material over this area. Whereas the rest of the wounds do not look so bad. The left ischial wound we have been using Santyl and calcium alginate T the left heel surface necrotic debris using Iodoflex o The left lateral leg is still healed Areas on the left dorsal foot and the right dorsal foot are about the same. There is some inflammation on the left which might represent contact dermatitis, fungal dermatitis I am doubtful cellulitis although this looks better than last week 05/30/18; CT scan done at Hospital did not show any osteomyelitis or abscess. Suggested the possibility of underlying cellulitis although I don't see [name] lot of evidence of this at the bedside The wound itself on the left buttock/upper thigh actually looks  somewhat better. No debridement Left heel also looks better no debridement continue Iodoflex Both dorsal first second toe spaces appear better using Lotrisone. Left still required debridement 06/06/18; Intake reported some purulent looking drainage from the left gluteal wound. Using Santyl and calcium alginate Left heel looks better although still [name] nonviable surface requiring debridement The left dorsal foot first/second webspace actually expanding and somewhat deeper. I may consider doing [name] shave biopsy of this area Right dorsal foot first/second webspace appears stable to improved. Using Lotrisone and silver alginate to both these areas 06/13/18 Left gluteal surface looks better. Now separated in the 2 wounds. No debridement required. Still drainage. We'll continue silver alginate Left heel continues to look better with Iodoflex continue this for at least another week Of his dorsal foot wounds the area on the left still has some depth although it looks better than last week. We've been using Lotrisone and silver alginate 06/20/18 Left gluteal continues to look better healthy tissue Left heel continues to look better healthy granulation wound is smaller. He is using Iodoflex and his long as this continues continue the Iodoflex Dorsal right foot looks better unfortunately dorsal left foot does not. There is swelling and erythema of his forefoot. He had minor trauma to this several days ago but doesn't think this was enough to have caused any tissue injury. Foot looks like cellulitis, we have had this problem before 06/27/18 on evaluation today patient appears to be doing [name] little worse in regard to his foot ulcer. Unfortunately it does  appear that he has methicillin-resistant staph aureus and unfortunately there really are no oral options for him as he's allergic to sulfa drugs as well as I box. Both of which would really be his only options for treating this infection. In the past he has been given  and effusion of Orbactiv. This is done very well for him in the past again it's one time dosing IV antibiotic therapy. Subsequently I do believe this is something we're gonna need to see about doing at this point in time. Currently his other wounds seem to be doing somewhat better in my pinion I'm pretty happy in that regard. 07/03/18 on evaluation today patient's wounds actually appear to be doing fairly well. He has been tolerating the dressing changes without complication. All in all he seems to be showing signs of improvement. In regard to the antibiotics he has been dealing with infectious disease since I saw him last week as far as getting this scheduled. In the end [facility]'s [facility] this coming Friday. In the meantime he has been continuing to perform the dressing changes in such as previous. There does not appear to be any evidence of infection worsengin at this time. 07/10/18; Since I last saw this man 2 weeks ago things have actually improved. IV antibiotics of resulted in less forefoot erythema although there is still some present. He is not systemically unwell Left buttock wounds 2 now have no depth there is increased epithelialization Using silver alginate Left heel still requires debridement using Iodoflex Left dorsal foot still with [name] sizable wound about the size of [name] border but healthy granulation Right dorsal foot still with [name] slitlike area using silver alginate 07/18/18; the patient's cellulitis in the left foot is improved in fact I think it is on its way to resolving. Left buttock wounds 2 both look better although the larger one has hypertension granulation we've been using silver alginate Left heel has some thick circumferential redundant skin over the wound edge which will need to be removed today we've been using Iodoflex Left dorsal foot is still [name] sizable wound required debridement using silver alginate The right dorsal  foot is just about closed only [name] small open area remains here 07/25/18; left foot cellulitis is resolved Left buttock wounds 2 both look better. Hyper-granulation on the major area Left heel as some debris over the surface but otherwise looks [name] healthier wound. Using silver collagen Right dorsal foot is just about closed 07/31/18; arrives with our intake nurse worried about purulent drainage from the buttock. We had hyper-granulation here last week His buttock wounds 2 continue to look better Left heel some debris over the surface but measuring smaller. Right dorsal foot unfortunately has openings between the toes Left foot superficial wound looks less aggravated. 08/07/18 Buttock wounds continue to look better although some of her granulation and the larger medial wound. silver alginate Left heel continues to look [name] lot better.silver collagen Left foot superficial wound looks less stable. Requires debridement. He has [name] new wound superficial area on the foot on the lateral dorsal foot. Right foot looks better using silver alginate without Lotrisone 08/14/2018; patient was in the ER last week diagnosed with [name] UTI. He is now on Cefpodoxime and Macrodantin. Buttock wounds continued to be smaller. Using silver alginate Left heel continues to look better using silver collagen Left foot superficial wound looks as though it is improving Right dorsal foot area is  just about healed. 08/21/2018; patient is completed his antibiotics for his UTI. He has 2 open areas on the buttocks. There is still not closed although the surface looks satisfactory. Using silver alginate Left heel continues to improve using silver collagen The bilateral dorsal foot areas which are at the base of his first and second toes/possible tinea pedis are actually stable on the left but worse on the right. The area on the left required debridement of necrotic surface. After debridement I obtained [name] specimen for PCR culture. The right  dorsal foot which is been just about healed last week is now reopened 08/28/2018; culture done on the left dorsal foot showed coag negative staph both staph epidermidis and Lugdunensis. I think this is worthwhile initiating systemic treatment. I will use doxycycline given his long list of allergies. The area on the left heel slightly improved but still requiring debridement. The large wound on the buttock is just about closed whereas the smaller one is larger. Using silver alginate in this area 09/04/2018; patient is completing his doxycycline for the left foot although this continues to be [name] very difficult wound area with very adherent necrotic debris. We are using silver alginate to all his wounds right foot left foot and the small wounds on his buttock, silver collagen on the left heel. 09/11/2018; once again this patient has intense erythema and swelling of the left forefoot. Lesser degrees of erythema in the right foot. He has [name] long list of allergies and intolerances. I will reinstitute doxycycline. 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 Using silver alginate Left heel also looks better using silver collagen Unfortunately both the areas on his feet look worse. The area on the left first second webspace is now gone through to the plantar part of his foot. The area on the left foot anteriorly is irritated with erythema and swelling in the forefoot. 09/25/2018 His wound on the left plantar heel looks better. Using silver collagen The area on the left buttock 2 small remnant areas. One is closed one is still open. Using silver alginate The areas between both his first and second toes look worse. This in spite of long-standing antifungal therapy with ketoconazole and silver alginate which should have antifungal activity He has small areas around his original wound on the left calf one is on the bottom of the original scar tissue and one superiorly  both of these are small and superficial but again given wound history in this site this is worrisome 10/02/2018 Left plantar heel continues to gradually contract using silver collagen Left buttock wound is unchanged using silver alginate The areas on his dorsal feet between his first and second toes bilaterally look about the same. I prescribed clindamycin ointment to see if we can address chronic staph colonization and also the underlying possibility of erythrasma The left lateral lower extremity wound is actually on the lateral part of his ankle. Small open area here. We have been using silver alginate 10/09/2018; Left plantar heel continues to look healthy and contract. No debridement is required Left buttock slightly smaller with [name] tape injury wound just below which was new this week Dorsal feet somewhat improved I have been using clindamycin Left lateral looks lower extremity the actual open area looks worse although [name] lot of this is epithelialized. I am going to change to silver collagen today He has [name] lot more swelling in the right leg although this is not pitting  not red and not particularly warm there is [name] lot of spasm in the right leg usually indicative of people with paralysis of some underlying discomfort. We have reviewed his vascular status from 2017 he had [name] left greater saphenous vein ablation. I wonder about referring him back to vascular surgery if the area on the left leg continues to deteriorate. 10/16/2018 in today for follow-up and management of multiple lower extremity ulcers. His left Buttock wound is much lower smaller and almost closed completely. The wound to the left ankle has began to reopen with Epithelialization and some adherent slough. He has multiple new areas to the left foot and leg. The left dorsal foot without much improvement. Wound present between left great webspace and 2nd toe. Erythema and edema present right leg. Right LE ultrasound obtained on 10/10/18  was negative for DVT . 10/23/2018; Left buttock is closed over. Still dry macerated skin but there is no open wound. I suspect this is chronic pressure/moisture Left lateral calf is quite [name] bit worse than when I saw this last. There is clearly drainage here he has macerated skin into the left plantar heel. We will change the primary dressing to alginate Left dorsal foot has some improvement in overall wound area. Still using clindamycin and silver alginate Right dorsal foot about the same as the left using clindamycin and silver alginate The erythema in the right leg has resolved. He is DVT rule out was negative Left heel pressure area required debridement although the wound is smaller and the surface is health 10/26/2018 The patient came back in for his nurse check today predominantly because of the drainage coming out of the left lateral leg with [name] recent reopening of his original wound on the left lateral calf. He comes in today with [name] large amount of surrounding erythema around the wound extending from the calf into the ankle and even in the area on the dorsal foot. He is not systemically unwell. He is not febrile. Nevertheless this looks like cellulitis. We have been using silver alginate to the area. I changed him to [name] regular visit and I am going to prescribe him doxycycline. The rationale here is [name] long list of medication intolerances and [name] history of MRSA. I did not see anything that I thought would provide [name] valuable culture 10/30/2018 Follow-up from his appointment 4 days ago with really an extensive area of cellulitis in the left calf left lateral ankle and left dorsal foot. I put him on doxycycline. He has [name] long list of medication allergies which are true allergy reactions. 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 In any case he is [name] lot better today. The erythema especially in the anterior and lateral left calf is  better. He still has left ankle erythema. He also is complaining about increasing edema in the right leg we have only been using Kerlix Coban and he has been doing the wraps at home. Finally he has [name] spotty rash on the medial part of his upper left calf which looks like folliculitis or perhaps wrap occlusion type injury. Small superficial macules not pustules 11/06/18 patient arrives today with again [name] considerable degree of erythema around the wound on the left lateral calf extending into the dorsal ankle and dorsal foot. This is [name] lot worse than when I saw this last week. He is on doxycycline really with not [name] lot of improvement. He has not been systemically unwell  Wounds on the; left heel actually looks improved. Original area on the left foot and proximity to the first and second toes looks about the same. He has superficial areas on the dorsal foot, anterior calf and then the reopening of his original wound on the left lateral calf which looks about the same The only area he has on the right is the dorsal webspace first and second which is smaller. He has [name] large area of dry erythematous skin on the left buttock small open area here. 11/13/2018; the patient arrives in much better condition. The erythema around the wound on the left lateral calf is [name] lot better. Not sure whether this was the clindamycin or the TCA and ketoconazole or just in the improvement in edema control [stasis dermatitis]. In any case this is [name] lot better. The area on the left heel is very small and just about resolved using silver collagen we have been using silver alginate to the areas on his dorsal feet 11/20/2018; his wounds include the left lateral calf, left heel, dorsal aspects of both feet just proximal to the first second webspace. He is stable to slightly improved. I did not think any changes to his dressings were going to be necessary 11/27/2018 he has [name] reopening on the left buttock which is surrounded by what looks  like tinea or perhaps some other form of dermatitis. The area on the left dorsal foot has some erythema around it I have marked this area but I am not sure whether this is cellulitis or not. Left heel is not closed. Left calf the reopening is really slightly longer and probably worse 1/13; in general things look better and smaller except for the left dorsal foot. Area on the left heel is just about closed, left buttock looks better only [name] small wound remains in the skin looks better [using Lotrisone] 1/20; the area on the left heel only has [name] few remaining open areas here. Left lateral calf about the same in terms of size, left dorsal foot slightly larger right lateral foot still not closed. The area on the left buttock has no open wound and the surrounding skin looks [name] lot better 1/27; the area on the left heel is closed. Left lateral calf better but still requiring extensive debridements. The area on his left buttock is closed. He still has the open areas on the left dorsal foot which is slightly smaller in the right foot which is slightly expanded. We have been using Iodoflex on these areas as well 2/3; left heel is closed. Left lateral calf still requiring debridement using Iodoflex there is no open area on his left buttock however he has dry scaly skin over [name] large area of this. Not really responding well to the Lotrisone. Finally the areas on his dorsal feet at the level of the first second webspace are slightly smaller on the right and about the same on the left. Both of these vigorously debrided with Anasept and gauze 2/10; left heel remains closed he has dry erythematous skin over the left buttock but there is no open wound here. Left lateral leg has come in and with. Still requiring debridement we have been using Iodoflex here. Finally the area on the left dorsal foot and right dorsal foot are really about the same extremely dry callused fissured areas. He does not yet have [name] dermatology  appointment 2/17; left heel remains closed. He has [name] new open area on the left buttock. The area on the left  lateral calf is bigger longer and still covered in necrotic debris. No major change in his foot areas bilaterally. I am awaiting for [name] dermatologist to look on this. We have been using ketoconazole I do not know that this is been doing any good at all. 2/24; left heel remains closed. The left buttock wound that was new reopening last week looks better. The left lateral calf appears better also although still requires debridement. The major area on his foot is the left first second also requiring debridement. We have been putting Prisma on all wounds. I do not believe that the ketoconazole has done too much good for his feet. He will use Lotrisone I am going to give him [name] 2-week course of terbinafine. We still do not have [name] dermatology appointment 3/2 left heel remains closed however there is skin over bone in this area I pointed this out to him today. The left buttock wound is epithelialized but still does not look completely stable. The area on the left leg required debridement were using silver collagen here. With regards to his feet we changed to Lotrisone last week and silver alginate. 3/9; left heel remains closed. Left buttock remains closed. The area on the right foot is essentially closed. The left foot remains unchanged. Slightly smaller on the left lateral calf. Using silver collagen to both of these areas 3/16-Left heel remains closed. Area on right foot is closed. Left lateral calf above the lateral malleolus open wound requiring debridement with easy bleeding. Left dorsal wound proximal to first toe also debrided. Left ischial area open new. Patient has been using Prisma with wrapping every 3 days. Dermatology appointment is apparently tomorrow.Patient has completed his terbinafine 2-week course with some apparent improvement according to him, there is still flaking and dry skin  in his foot on the left 3/23; area on the right foot is reopened. The area on the left anterior foot is about the same still [name] very necrotic adherent surface. He still has the area on the left leg and reopening is on the left buttock. He apparently saw dermatology although I do not have [name] note. According to the patient who is usually fairly well informed they did not have any good ideas. Put him on oral terbinafine which she is been on before. 3/30; using silver collagen to all wounds. Apparently his dermatologist put him on doxycycline and rifampin presumably some culture grew staph. I do not have this result. He remains on terbinafine although I have used terbinafine on him before 4/6; patient has had [name] fairly substantial reopening on the right foot between the first and second toes. He is finished his terbinafine and I believe is on doxycycline and rifampin still as prescribed by dermatology. We have been using silver collagen to all his wounds although the patient reports that he thinks silver alginate does better on the wounds on his buttock. 4/13; the area on his left lateral calf about the same size but it did not require debridement. Left dorsal foot just proximal to the webspace between the first and second toes is about the same. Still nonviable surface. I note some superficial bronze discoloration of the dorsal part of his foot Right dorsal foot just proximal to the first and second toes also looks about the same. I still think there may be the same discoloration I noted above on the left Left buttock wound looks about the same 4/20; left lateral calf appears to be gradually contracting using silver collagen. He remains  on erythromycin empiric treatment for possible erythrasma involving his digital spaces. The left dorsal foot wound is debrided of tightly adherent necrotic debris and really cleans up quite nicely. The right area is worse with expansion. I did not debride this it is now  over the base of the second toe The area on his left buttock is smaller no debridement is required using silver collagen 5/4; left calf continues to make good progress. He arrives with erythema around the wounds on his dorsal foot which even extends to the plantar aspect. Very concerning for coexistent infection. He is finished the erythromycin I gave him for possible erythrasma this does not seem to have helped. The area on the left foot is about the same base of the dorsal toes Is area on the buttock looks improved on the left 5/11; left calf and left buttock continued to make good progress. Left foot is about the same to slightly improved. Major problem is on the right foot. He has not had an x-ray. Deep tissue culture I did last week showed both Enterobacter and E. coli. I did not change the doxycycline I put him on empirically although neither 1 of these were plated to doxycycline. He arrives today with the erythema looking worse on both the dorsal and plantar foot. Macerated skin on the bottom of the foot. he has not been systemically unwell 5/18-Patient returns at 1 week, left calf wound appears to be making some progress, left buttock wound appears slightly worse than last time, left foot wound looks slightly better, right foot redness is marginally better. X-ray of both feet show no air or evidence of osteomyelitis. Patient is finished his Omnicef and terbinafine. He continues to have macerated skin on the bottom of the left foot as well as right 5/26; left calf wound is better, left buttock wound appears to have multiple small superficial open areas with surrounding macerated skin. X-rays that I did last time showed no evidence of osteomyelitis in either foot. He is finished cefdinir and doxycycline. I do not think that he was on terbinafine. He continues to have [name] large superficial open area on the right foot anterior dorsal and slightly between the first and second toes. I did send him to  dermatology 2 months ago or so wondering about whether they would do [name] fungal scraping. I do not believe they did but did do [name] culture. We have been using silver alginate to the toe areas, he has been using antifungals at home topically either ketoconazole or Lotrisone. We are using silver collagen on the left foot, silver alginate on the right, silver collagen on the left lateral leg and silver alginate on the left buttock 6/1; left buttock area is healed. We have the left dorsal foot, left lateral leg and right dorsal foot. We are using silver alginate to the areas on both feet and silver collagen to the area on his left lateral calf 6/8; the left buttock apparently reopened late last week. He is not really sure how this happened. He is tolerating the terbinafine. Using silver alginate to all wounds 6/15; left buttock wound is larger than last week but still superficial. Came in the clinic today with [name] report of purulence from the left lateral leg I did not identify any infection Both areas on his dorsal feet appear to be better. He is tolerating the terbinafine. Using silver alginate to all wounds 6/22; left buttock is about the same this week, left calf quite [name] bit better. His  left foot is about the same however he comes in with erythema and warmth in the right forefoot once again. Culture that I gave him in the beginning of May showed Enterobacter and E. coli. I gave him doxycycline and things seem to improve although neither 1 of these organisms was specifically plated. 6/29; left buttock is larger and dry this week. Left lateral calf looks to me to be improved. Left dorsal foot also somewhat improved right foot completely unchanged. The erythema on the right foot is still present. He is completing the Ceftin dinner that I gave him empirically [see discussion above.) 7/6 - All wounds look to be stable and perhaps improved, the left buttock wound is slightly smaller, per patient bleeds easily,  completed ceftin, the right foot redness is less, he is on terbinafine 7/13; left buttock wound about the same perhaps slightly narrower. Area on the left lateral leg continues to narrow. Left dorsal foot slightly smaller right foot about the same. We are using silver alginate on the right foot and Hydrofera Blue to the areas on the left. Unna boot on the left 2 layer compression on the right 7/20; left buttock wound absolutely the same. Area on lateral leg continues to get better. Left dorsal foot require debridement as did the right no major change in the 7/27; left buttock wound the same size necrotic debris over the surface. The area on the lateral leg is closed once again. His left foot looks better right foot about the same although there is some involvement now of the posterior first second toe area. He is still on terbinafine which I have given him for [name] month, not certain [name] centimeter major change 06/25/19-All wounds appear to be slightly improved according to report, left buttock wound looks clean, both foot wounds have minimal to no debris the right dorsal foot has minimal slough. We are using Hydrofera Blue to the left and silver alginate to the right foot and ischial wound. 8/10-Wounds all appear to be around the same, the right forefoot distal part has some redness which was not there before, however the wound looks clean and small. Ischial wound looks about the same with no changes 8/17; his wound on the left lateral calf which was his original chronic venous insufficiency wound remains closed. Since I last saw him the areas on the left dorsal foot right dorsal foot generally appear better but require debridement. The area on his left initial tuberosity appears somewhat larger to me perhaps hyper granulated and bleeds very easily. We have been using Hydrofera Blue to the left dorsal foot and silver alginate to everything else 8/24; left lateral calf remains closed. The areas on his  dorsal feet on the webspace of the first and second toes bilaterally both look better. The area on the left buttock which is the pressure ulcer stage II slightly smaller. I change the dressing to Hydrofera Blue to all areas 8/31; left lateral calf remains closed. The area on his dorsal feet bilaterally look better. Using Hydrofera Blue. Still requiring debridement on the left foot. No change in the left buttock pressure ulcers however 9/14; left lateral calf remains closed. Dorsal feet look quite [name] bit better than 2 weeks ago. Flaking dry skin also [name] lot better with the ammonium lactate I gave him 2 weeks ago. The area on the left buttock is improved. He states that his Roho cushion developed [name] leak and he is getting [name] new one, in the interim he is offloading this  vigorously 9/21; left calf remains closed. Left heel which was [name] possible DTI looks better this week. He had macerated tissue around the left dorsal foot right foot looks satisfactory and improved left buttock wound. I changed his dressings to his feet to silver alginate bilaterally. Continuing Hydrofera Blue on the left buttock. 9/28 left calf remains closed. Left heel did not develop anything [possible DTI] dry flaking skin on the left dorsal foot. Right foot looks satisfactory. Improved left buttock wound. We are using silver alginate on his feet Hydrofera Blue on the buttock. I have asked him to go back to the Lotrisone on his feet including the wounds and surrounding areas 10/5; left calf remains closed. The areas on the left and right feet about the same. [name] lot of this is epithelialized however debris over the remaining open areas. He is using Lotrisone and silver alginate. The area on the left buttock using Hydrofera Blue 10/26. Patient has been out for 3 weeks secondary to Covid concerns. He tested negative but I think his wife tested positive. He comes in today with the left foot substantially worse, right foot about the same. Even  more concerning he states that the area on his left buttock closed over but then reopened and is considerably deeper in one aspect than it was before [stage III wound] 11/2; left foot really about the same as last week. Quarter sized wound on the dorsal foot just proximal to the first second toes. Surrounding erythema with areas of denuded epithelium. This is not really much different looking. Did not look like cellulitis this time however. Right foot area about the same.. We have been using silver alginate alginate on his toes Left buttock still substantial irritated skin around the wound which I think looks somewhat better. We have been using Hydrofera Blue here. 11/9; left foot larger than last week and [name] very necrotic surface. Right foot I think is about the same perhaps slightly smaller. Debris around the circumference also addressed. Unfortunately on the left buttock there is been [name] decline. Satellite lesions below the major wound distally and now [name] an additional one posteriorly we have been using Hydrofera Blue but I think this is [name] pressure issue 11/16; left foot ulcer dorsally again [name] very adherent necrotic surface. Right foot is about the same. Not much change in the pressure ulcer on his left buttock. 11/30; left foot ulcer dorsally basically the same as when I saw him 2 weeks ago. Very adherent fibrinous debris on the wound surface. Patient reports [name] lot of drainage as well. 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 We have been using Iodoflex, patient changed back to alginate because of the drainage. Area on his right dorsal foot really looks benign with [name] healthier surface certainly [name] lot better than on the left. Left buttock wounds all improved using Hydrofera Blue 12/7; left dorsal foot again no improvement. Tightly adherent debris. PCR culture I did last week only showed likely skin contaminant. I have gone ahead and done [name] punch  biopsy of this which is about the last thing in terms of investigations I can think to do. He has known venous insufficiency and venous hypertension and this could be the issue here. The area on the right foot is about the same left buttock slightly worse according to our intake nurse secondary to [facility] Blue sticking to the wound 12/14; biopsy of the left foot that I did last time showed changes that  could be related to wound healing/chronic stasis dermatitis phenomenon no neoplasm. We have been using silver alginate to both feet. I change the one on the left today to Sorbact and silver alginate to his other 2 wounds 12/28; the patient arrives with the following problems; Major issue is the dorsal left foot which continues to be [name] larger deeper wound area. Still with [name] completely nonviable surface Paradoxically the area mirror image on the right on the right dorsal foot appears to be getting better. He had some loss of dry denuded skin from the lower part of his original wound on the left lateral calf. Some of this area looked [name] little vulnerable and for this reason we put him in wrap that on this side this week The area on his left buttock is larger. He still has the erythematous circular area which I think is [name] combination of pressure, sweat. This does not look like cellulitis or fungal dermatitis 11/26/2019; -Dorsal left foot large open wound with depth. Still debris over the surface. Using Sorbact The area on the dorsal right foot paradoxically has closed over He has [name] reopening on the left ankle laterally at the base of his original wound that extended up into the calf. This appears clean. The left buttock wound is smaller but with very adherent necrotic debris over the surface. We have been using silver alginate here as well The patient had arterial studies done in 2017. He had biphasic waveforms at the dorsalis pedis and posterior tibial bilaterally. ABI in the left was 1.17.  Digit waveforms were dampened. He has slight spasticity in the great toes I do not think [name] TBI would be possible 1/11; the patient comes in today with [name] sizable reopening between the first and second toes on the right. This is not exactly in the same location where we have been treating wounds previously. According to our intake nurse this was actually fairly deep but 0.6 cm. The area on the left dorsal foot looks about the same the surface is somewhat cleaner using Sorbact, his MRI is in 2 days. We have not managed yet to get arterial studies. The new reopening on the left lateral calf looks somewhat better using alginate. The left buttock wound is about the same using alginate 1/18; the patient had his ARTERIAL studies which were quite normal. ABI in the right at 1.13 with triphasic/biphasic waveforms on the left ABI 1.06 again with triphasic/biphasic waveforms. It would not have been possible to have done [name] toe brachial index because of spasticity. We have been using Sorbac to the left foot alginate to the rest of his wounds on the right foot left lateral calf and left buttock 1/25; arrives in clinic with erythema and swelling of the left forefoot worse over the first MTP area. This extends laterally dorsally and but also posteriorly. Still has an area on the left lateral part of the lower part of his calf wound it is eschared and clearly not closed. Area on the left buttock still with surrounding irritation and erythema. Right foot surface wound dorsally. The area between the right and first and second toes appears better. 2/1; The left foot wound is about the same. Erythema slightly better I gave him [name] week of doxycycline empirically Right foot wound is more extensive extending between the toes to the plantar surface Left lateral calf really no open surface on the inferior part of his original wound however the entire area still looks vulnerable Absolutely no improvement in the  left buttock  wound required debridement. 2/8; the left foot is about the same. Erythema is slightly improved I gave him clindamycin last week. Right foot looks better he is using Lotrimin and silver alginate He has [name] breakdown in the left lateral calf. Denuded epithelium which I have removed Left buttock about the same were using Hydrofera Blue 2/15; left foot is about the same there is less surrounding erythema. Surface still has tightly adherent debris which I have debriding however not making any progress Right foot has [name] substantial wound on the medial right second toe between the first and second webspace. Still an open area on the left lateral calf distal area. Buttock wound is about the same 2/22; left foot is about the same less surrounding erythema. Surface has adherent debris. Polymen Ag Right foot area significant wound between the first and second toes. We have been using silver alginate here Left lateral leg polymen Ag at the base of his original venous insufficiency wound Left buttock some improvement here 3/1; Right foot is deteriorating in the first second toe webspace. Larger and more substantial. We have been using silver alginate. Left dorsal foot about the same markedly adherent surface debris using PolyMem Ag Left lateral calf surface debris using PolyMem AG Left buttock is improved again using PolyMem Ag. He is completing his terbinafine. The erythema in the foot seems better. He has been on this for 2 weeks 3/8; no improvement in any wound area in fact he has [name] small open area on the dorsal midfoot which is new this week. He has not gotten his foot x-rays yet 3/15; his x-rays were both negative for osteomyelitis of both feet. No major change in any of his wounds on the extremities however his buttock wounds are better. We have been using polymen on the buttocks, left lower leg. Iodoflex on the left foot and silver alginate on the right 3/22; arrives in clinic today with the 2 major  issues are the improvement in the left dorsal foot wound which for once actually looks healthy with [name] nice healthy wound surface without debridement. Using Iodoflex here. Unfortunately on the left lateral calf which is in the distal part of his original wound he came to the clinic here for there was purulent drainage noted some increased breakdown scattered around the original area and [name] small area proximally. We we are using polymen here will change to silver alginate today. His buttock wound on the left is better and I think the area on the right first second toe webspace is also improved 3/29; left dorsal foot looks better. Using Iodoflex. Left ankle culture from deterioration last time grew E. coli, Enterobacter and Enterococcus. I will give him [name] course of cefdinir although that will not cover Enterococcus. The area on the right foot in the webspace of the first and second toe lateral first toe looks better. The area on his buttock is about healed Vascular appointment is on April 21. This is to look at his venous system vis--vis continued breakdown of the wounds on the left including the left lateral leg and left dorsal foot he. He has had previous ablations on this side 4/5; the area between the right first and second toes lateral aspect of the first toe looks better. Dorsal aspect of the left first toe on the left foot also improved. Unfortunately the left lateral lower leg is larger and there is [name] second satellite wound superiorly. The usual superficial abrasions on the left buttock overall better  but certainly not closed 4/12; the area between the right first and second toes is improved. Dorsal aspect of the left foot also slightly smaller with [name] vibrant healthy looking surface. No real change in the left lateral leg and the left buttock wound is healed He has an unaffordable co-pay for Apligraf. Appointment with vein and vascular with regards to the left leg venous part of the circulation is  on 4/21 4/19; we continue to see improvement in all wound areas. Although this is minor. He has his vascular appointment on 4/21. The area on the left buttock has not reopened although right in the center of this area the skin looks somewhat threatened 4/26; the left buttock is unfortunately reopened. In general his left dorsal foot has [name] healthy surface and looks somewhat smaller although it was not measured as such. The area between his first and second toe webspace on the right as [name] small wound against the first toe. The patient saw vascular surgery. The real question I was asking was about the small saphenous vein on the left. He has previously ablated left greater saphenous vein. Nothing further was commented on on the left. Right greater saphenous vein without reflux at the saphenofemoral junction or proximal thigh there was no indication for ablation of the right greater saphenous vein duplex was negative for DVT bilaterally. They did not think there was anything from [name] vascular surgery point of view that could be offered. They ABIs within normal limits 5/3; only small open area on the left buttock. The area on the left lateral leg which was his original venous reflux is now 2 wounds both which look clean. We are using Iodoflex on the left dorsal foot which looks healthy and smaller. He is down to [name] very tiny area between the right first and second toes, using silver alginate 5/10; all of his wounds appear better. We have much better edema control in 4 layer compression on the left. This may be the factor that is allowing the left foot and left lateral calf to heal. He has external compression garments at home 04/14/20-All of his wounds are progressing well, the left forefoot is practically closed, left ischium appears to be about the same, right toe webspace is also smaller. The left lateral leg is about the same, continue using Hydrofera Blue to this, silver alginate to the ischium, Iodoflex  to the toe space on the right 6/7; most of his wounds outside of the left buttock are doing well. The area on the left lateral calf and left dorsal foot are smaller. The area on the right foot in between the first and second toe webspace is barely visible although he still says there is some drainage here is the only reason I did not heal this out. Unfortunately the area on the left buttock almost looks like he has [name] skin tear from tape. He has open wound and then [name] large flap of skin that we are trying to get adherence over an area just next to the remaining wound 6/21; 2 week follow-up. I believe is been here for nurse visits. Miraculously the area between his first and second toes on the left dorsal foot is closed over. Still open on the right first second web space. The left lateral calf has 2 open areas. Distally this is more superficial. The proximal area had [name] little more depth and required debridement of adherent necrotic material. His buttock wound is actually larger we have been using silver alginate here  6/28; the patient's area on the left foot remains closed. Still open wet area between the first and second toes on the right and also extending into the plantar aspect. We have been using silver alginate in this location. He has 2 areas on the left lower leg part of his original long wounds which I think are better. We have been using Hydrofera Blue here. Hydrofera Blue to the left buttock which is stable 7/12; left foot remains closed. Left ankle is closed. May be [name] small area between his right first and second toes the only truly open area is on the left buttock. We have been using Hydrofera Blue here 7/19; patient arrives with marked deterioration especially in the left foot and ankle. We did not put him in [name] compression wrap on the left last week in fact he wore his juxta lite stockings on either side although he does not have an underlying stocking. He has [name] reopening on the left dorsal  foot, left lateral ankle and [name] new area on the right dorsal ankle. More worrisome is the degree of erythema on the left foot extending on the lateral foot into the lateral lower leg on the left 7/26; the patient had erythema and drainage from the lateral left ankle last week. Culture of this grew MRSA resistant to doxycycline and clindamycin which are the 2 antibiotics we usually use with this patient who has multiple antibiotic allergies including linezolid, trimethoprim sulfamethoxazole. I had give him an empiric doxycycline and he comes in the area certainly looks somewhat better although it is blotchy in his lower leg. He has not been systemically unwell. He has had areas on the left dorsal foot which is [name] reopening, chronic wounds on the left lateral ankle. Both of these I think are secondary to chronic venous insufficiency. The area between his first and second toes is closed as far as I can tell. He had [name] new wrap injury on the right dorsal ankle last week. Finally he has an area on the left buttock. We have been using silver alginate to everything except the left buttock we are using Hydrofera Blue 06/30/20-Patient returns at 1 week, has been given [name] sample dose pack of NUZYRA which is [name] tetracycline derivative [omadacycline], patient has completed those, we have been using silver alginate to almost all the wounds except the left ischium where we are using Hydrofera Blue all of them look better 8/16; since I last saw the patient he has been doing well. The area on the left buttock, left lateral ankle and left foot are all closed today. He has completed the [place] I gave him last time and tolerated this well. He still has open areas on the right dorsal ankle and in the right first second toe area which we are using silver alginate. 8/23; we put him in his bilateral external compression stockings last week as he did not have anything open on either leg except for concerning area between the right  first and second toe. He comes in today with an area on the left dorsal foot slightly more proximal than the original wound, the left lateral foot but this is actually [name] continuation of the area he had on the left lateral ankle from last time. As well he is opened up on the left buttock again. 8/30; comes in today with things looking [name] lot better. The area on the left lower ankle has closed down as has the left foot but with eschar in both areas.  The area on the dorsal right ankle is also epithelialized. Very little remaining of the left buttock wound. We have been using silver alginate on all wound areas 9/13; the area in the first second toe webspace on the right has fully epithelialized. He still has some vulnerable epithelium on the right and the ankle and the dorsal foot. He notes weeping. He is using his juxta lite stocking. On the left again the left dorsal foot is closed left lateral ankle is closed. We went to the juxta lite stocking here as well. Still vulnerable in the left buttock although only 2 small open areas remain here 9/27; 2-week follow-up. We did not look at his left leg but the patient says everything is closed. He is [name] bit disturbed by the amount of edema in his left foot he is using juxta lite stockings but asking about over the toes stockings which would be 30/40, will talk to him next time. According to him there is no open wound on either the left foot or the left ankle/calf He has an open area on the dorsal right calf which I initially point [name] wrap injury. He has superficial remaining wound on the left ischial tuberosity been using silver alginate although he says this sticks to the wound 10/5; we gave him 2-week follow-up but he called yesterday expressing some concerns about his right foot right ankle and the left buttock. He came in early. There is still no open areas on the left leg and that still in his juxta lite stocking 10/11; he only has 1 small area on the left  buttock that remains measuring millimeters 1 mm. Still has the same irritated skin in this area. We recommended zinc oxide when this eventually closes and pressure relief is meticulously is he can do this. He still has an area on the dorsal part of his right first through third toes which is [name] bit irritated and still open and on the dorsal ankle near the crease of the ankle. We have been using silver alginate and using his own stocking. He has nothing open on the left leg or foot 10/25; 2-week follow-up. Not nearly as good on the left buttock as I was hoping. For open areas with 5 looking threatened small. He has the erythematous irritated chronic skin in this area. 1 area on the right dorsal ankle. He reports this area bleeds easily Right dorsal foot just proximal to the base of his toes We have been using silver alginate. 11/8; 2-week follow-up. Left buttock is about the same although I do not think the wounds are in the same location we have been using silver alginate. I have asked him to use zinc oxide on the skin around the wounds. He still has [name] small area on the right dorsal ankle he reports this bleeds easily Right dorsal foot just proximal to the base of the toes does not have anything open although the skin is very dry and scaly He has [name] new opening on the nailbed of the left great toe. Nothing on the left ankle 11/29; 3-week follow-up. Left buttock has 2 open areas. And washing of these wounds today started bleeding easily. Suggesting very friable tissue. We have been using silver alginate. Right dorsal ankle which I thought was initially [name] wrap injury we have been using silver alginate. Nothing open between the toes that I can see. He states the area on the left dorsal toe nailbed healed after the last visit in 2 or 3  days 12/13; 3-week follow-up. His left buttock now has 3 open areas but the original 2 areas are smaller using polymen here. Surrounding skin looks better. The  right dorsal ankle is closed. He has [name] small opening on the right dorsal foot at the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 [name] This debridement is with great difficulty. [name] 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 In spite of that his leg actually looks somewhat better. There is still copious drainage and some erythema but it is generally better. The oral options that were obvious including Zyvox and sulfonamides he has rash issues both of these. This is sensitive to rifampin but this is not usually used along gentamicin but this is parenteral and again not used along. The obvious alternative is vancomycin. He has had his arterial studies. [name] T brachial index was 1.3 oe on the right. His waveforms were biphasic bilaterally. Doppler waveforms of the digit were normal in the right damp and on the left. Comment that this could've been due to extreme edema. His venous  studies show reflux on both sides in the femoral popliteal veins as well as the greater and lesser saphenous veins bilaterally. Ultimately he is going to need to see vascular surgery about this issue. Hopefully when we can get his wounds and [name] little better shape. 02/19/16; the patient was able to complete [name] course of [facility] for MRSA in the  face of multiple antibiotic allergies. Arterial studies showed an ABI of him 0.88 on the right 1.17 on the left the. Waveforms were biphasic at the posterior tibial and dorsalis pedis digital waveforms were normal. Right toe brachial index was 1.3 limited by shaking and edema. His venous study showed widespread reflux in the left at the common femoral vein the greater and lesser saphenous vein the greater and lesser saphenous vein on the right as well as the popliteal and femoral vein. The popliteal and femoral vein on the left did not show reflux. His wounds on the right leg give healed on the left he is still using Santyl. 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 The erythema has not really resolved and I wonder if this is mostly venous inflammation rather than cellulitis. Still using Santyl. He is approved for Apligraf 03/04/16; there is less erythema around the wound. Both wounds require aggressive surgical debridement. Not yet ready for Apligraf 03/11/16; aggressive debridement again. Not ready for Apligraf 03/18/16 aggressive debridement again. 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 Has been to see vascular surgery he is being planned for [name] venous ablation 03/25/16; aggressive debridement again of both wound areas on the left lateral leg. He is due for ablation surgery on May 22. He is much closer to being ready for an Apligraf. Has [name] new area between the left first and second toes 04/01/16 aggressive debridement done of both wounds. The new wound at the base of between his second and first toes  looks stable 04/08/16; continued aggressive debridement of both wounds on the left lower leg. He goes for his venous ablation on Monday. The new wound at the base of his first and second toes dorsally appears stable. 04/15/16; wounds aggressively debridement although the base of this looks considerably better Apligraf #1. He had ablation surgery on Monday I'll need to research these records. We only have approval for four Apligraf's 04/22/16; the patient is here for [name] wound check [Apligraf last week] intake nurse concerned about erythema around the wounds. Apparently [name] significant degree of drainage. The patient has chronic venous inflammation which I think accounts for most of this however I was asked to look at this today 04/26/16; the patient came back for check of possible cellulitis in his left foot however the Apligraf dressing was inadvertently removed therefore we elected to prep the wound for [name] second Apligraf. I put him on doxycycline on 6/1 the erythema in the foot 05/03/16 we did not remove the dressing from the superior wound as this is where I put all of his last Apligraf. Surface debridement done with [name] curette of the lower wound which looks very healthy. The area on the left foot also looks quite satisfactory at the dorsal artery at the first and second toes 05/10/16; continue Apligraf to this. Her wound, Hydrafera to the lower wound. He has [name] new area on the right second toe. Left dorsal foot firstoosecond toe also looks improved 05/24/16; wound dimensions must be smaller I was able to use Apligraf to all 3 remaining wound areas. 06/07/16 patient's last Apligraf was 2 weeks ago. He arrives today with the 2 wounds on his lateral left leg joined together. This would have to be seen as [name] negative. He also has [name] small wound in his first and second toe on the left dorsally with quite [name] bit of surrounding erythema in the first second and third toes. This looks to be  infected or inflamed, very  difficult clinical call. 06/21/16: lateral left leg combined wounds. Adherent surface slough area on the left dorsal foot at roughly the fourth toe looks improved 07/12/16; he now has [name] single linear wound on the lateral left leg. This does not look to be [name] lot changed from when I lost saw this. The area on his dorsal left foot looks considerably better however. 08/02/16; no major change in the substantial area on his left lateral leg since last time. We have been using Hydrofera Blue for [name] prolonged period of time now. The area on his left foot is also unchanged from last review 07/19/16; the area on his dorsal foot on the left looks considerably smaller. He is beginning to have significant rims of epithelialization on the lateral left leg wound. This also looks better. 08/05/16; the patient came in for [name] nurse visit today. Apparently the area on his left lateral leg looks better and it was wrapped. However in general discussion the patient noted [name] new area on the dorsal aspect of his right second toe. The exact etiology of this is unclear but likely relates to pressure. 08/09/16 really the area on the left lateral leg did not really look that healthy today perhaps slightly larger and measurements. The area on his dorsal right second toe is improved also the left foot wound looks stable to improved 08/16/16; the area on the last lateral leg did not change any of dimensions. Post debridement with [name] curet the area looked better. Left foot wound improved and the area on the dorsal right second toe is improved 08/23/16; the area on the left lateral leg may be slightly smaller both in terms of length and width. Aggressive debridement with [name] curette afterwards the tissue appears healthier. Left foot wound appears improved in the area on the dorsal right second toe is improved 08/30/16 patient developed [name] fever over the weekend and was seen in an urgent care. Felt to have [name] UTI and put on doxycycline. He has been  since changed over the phone to [facility]. After we took off the wrap on his right leg today the leg is swollen warm and erythematous, probably more likely the source of the fever 09/06/16; have been using collagen to the major left leg wound, silver alginate to the area on his anterior foot/toes 09/13/16; the areas on his anterior foot/toes on both sides appear to be virtually closed. Extensive wound on the left lateral leg perhaps slightly narrower but each visit still covered an adherent surface slough 09/16/16 patient was in for his usual Thursday nurse visit however the intake nurse noted significant erythema of his dorsal right foot. He is also running [name] low- grade fever and having increasing spasms in the right leg 09/20/16 here for cellulitis involving his right great toes and forefoot. This is [name] lot better. Still requiring debridement on his left lateral leg. Santyl direct says he needs prior authorization. Therefore his wife cannot change this at home 09/30/16; the patient's extensive area on the left lateral calf and ankle perhaps somewhat better. Using Santyl. The area on the left toes is healed and I think the area on his right dorsal foot is healed as well. There is no cellulitis or venous inflammation involving the right leg. He is going to need compression stockings here. 10/07/16; the patient's extensive wound on the left lateral calf and ankle does not measure any differently however there appears to be less adherent surface slough using Santyl and aggressive  weekly debridements 10/21/16; no major change in the area on the left lateral calf. Still the same measurement still very difficult to debridement adherent slough and nonviable subcutaneous tissue. This is not really been helped by several weeks of Santyl. Previously for 2 weeks I used Iodoflex for [name] short period. [name] prolonged course of Hydrofera Blue didn't really help. I'm not sure why I only used 2 weeks of Iodoflex on this  there is no evidence of surrounding infection. He has [name] small area on the right second toe which looks as though it's progressing towards closure 10/28/16; the wounds on his toes appear to be closed. No major change in the left lateral leg wound although the surface looks somewhat better using Iodoflex. He has had previous arterial studies that were normal. He has had reflux studies and is status post ablation although I don't have any exact notes on which vein was ablated. I'll need to check the surgical record 11/04/16; he's had [name] reopening between the first and second toe on the left and right. No major change in the left lateral leg wound. There is what appears to be cellulitis of the left dorsal foot 11/18/16 the patient was hospitalized initially in [place] and then subsequently transferred to [facility] and was admitted there from 11/09/16 through 11/12/16. He had developed progressive cellulitis on the right leg in spite of the doxycycline I gave him. I'd spoken to the hospitalist in [place] who was concerned about continuing leukocytosis. CT scan is what I suggested this was done which showed soft tissue swelling without evidence of osteomyelitis or an underlying abscess blood cultures were negative. At [facility] he was treated with vancomycin and Primaxin and then add an infectious disease consult. He was transitioned to Ceftaroline. He has been making progressive improvement. Overall [name] severe cellulitis of the right leg. He is been using silver alginate to her original wound on the left leg. The wounds in his toes on the right are closed there is [name] small open area on the base of the left second toe 11/26/15; the patient's right leg is much better although there is still some edema here this could be reminiscent from his severe cellulitis likely on top of some degree of lymphedema. His left anterior leg wound has less surface slough as reported by her intake nurse. Small wound at the base of  the left second toe 12/02/16; patient's right leg is better and there is no open wound here. His left anterior lateral leg wound continues to have [name] healthy-looking surface. Small wound at the base of the left second toe however there is erythema in the left forefoot which is worrisome 12/16/16; is no open wounds on his right leg. We took measurements for stockings. His left anterior lateral leg wound continues to have [name] healthy-looking surface. I'm not sure where we were with the Apligraf run through his insurance. We have been using Iodoflex. He has [name] thick eschar on the left first second toe interface, I suspect this may be fungal however there is no visible open 12/23/16; no open wound on his right leg. He has 2 small areas left of the linear wound that was remaining last week. We have been using Prisma, I thought I have disclosed this week, we can only look forward to next week 01/03/17; the patient had concerning areas of erythema last week, already on doxycycline for UTI through his primary doctor. The erythema is absolutely no better there is warmth and swelling both medially  from the left lateral leg wound and also the dorsal left foot. 01/06/17- Patient is here for follow-up evaluation of his left lateral leg ulcer and bilateral feet ulcers. He is on oral antibiotic therapy, tolerating that. Nursing staff and the patient states that the erythema is improved from Monday. 01/13/17; the predominant left lateral leg wound continues to be problematic. I had put Apligraf on him earlier this month once. However he subsequently developed what appeared to be an intense cellulitis around the left lateral leg wound. I gave him Dalvance I think on 2/12 perhaps 2/13 he continues on cefdinir. The erythema is still present but the warmth and swelling is improved. I am hopeful that the cellulitis part of this control. I wouldn't be surprised if there is an element of venous inflammation as well. 01/17/17. The  erythema is present but better in the left leg. His left lateral leg wound still does not have [name] viable surface buttons certain parts of this long thin wound it appears like there has been improvement in dimensions. 01/20/17; the erythema still present but much better in the left leg. I'm thinking this is his usual degree of chronic venous inflammation. The wound on the left leg looks somewhat better. Is less surface slough 01/27/17; erythema is back to the chronic venous inflammation. The wound on the left leg is somewhat better. I am back to the point where I like to try an Apligraf once again 02/10/17; slight improvement in wound dimensions. Apligraf #2. He is completing his doxycycline 02/14/17; patient arrives today having completed doxycycline last Thursday. This was supposed to be [name] nurse visit however once again he hasn't tense erythema from the medial part of his wound extending over the lower leg. Also erythema in his foot this is roughly in the same distribution as last time. He has baseline chronic venous inflammation however this is [name] lot worse than the baseline I have learned to accept the on him is baseline inflammation 02/24/17- patient is here for follow-up evaluation. He is tolerating compression therapy. His voicing no complaints or concerns he is here anticipating an Apligraf 03/03/17; he arrives today with an adherent necrotic surface. I don't think this is surface is going to be amenable for Apligraf's. The erythema around his wound and on the left dorsal foot has resolved he is off antibiotics 03/10/17; better-looking surface today. I don't think he can tolerate Apligraf's. He tells me he had [name] wound VAC after [name] skin graft years ago to this area and they had difficulty with [name] seal. The erythema continues to be stable around this some degree of chronic venous inflammation but he also has recurrent cellulitis. We have been using Iodoflex 03/17/17; continued improvement in the surface and  may be small changes in dimensions. Using Iodoflex which seems the only thing that will control his surface 03/24/17- He is here for follow up evaluation of his LLE lateral ulceration and ulcer to right dorsal foot/toe space. He is voicing no complaints or concerns, He is tolerating compression wrap. 03/31/17 arrives today with [name] much healthier looking wound on the left lower extremity. We have been using Iodoflex for [name] prolonged period of time which has for the first time prepared and adequate looking wound bed although we have not had much in the way of wound dimension improvement. He also has [name] small wound between the first and second toe on the right 04/07/17; arrives today with [name] healthy-looking wound bed and at least the top 50% of this  wound appears to be now her. No debridement was required I have changed him to [facility] last week after prolonged Iodoflex. He did not do well with Apligraf's. We've had [name] re-opening between the first and second toe on the right 04/14/17; arrives today with [name] healthier looking wound bed contractions and the top 50% of this wound and some on the lesser 50%. Wound bed appears healthy. The area between the first and second toe on the right still remains problematic 04/21/17; continued very gradual improvement. Using [facility] 04/28/17; continued very gradual improvement in the left lateral leg venous insufficiency wound. His periwound erythema is very mild. We have been using Hydrofera Blue. Wound is making progress especially in the superior 50% 05/05/17; he continues to have very gradual improvement in the left lateral venous insufficiency wound. Both in terms with an length rings are improving. I debrided this every 2 weeks with #5 curet and we have been using Hydrofera Blue and again making good progress With regards to the wounds between his right first and second toe which I thought might of been tinea pedis he is not making as much progress very dry  scaly skin over the area. Also the area at the base of the left first and second toe in [name] similar condition 05/12/17; continued gradual improvement in the refractory left lateral venous insufficiency wound on the left. Dimension smaller. Surface still requiring debridement using Hydrofera Blue 05/19/17; continued gradual improvement in the refractory left lateral venous ulceration. Careful inspection of the wound bed underlying rumination suggested some degree of epithelialization over the surface no debridement indicated. Continue Hydrofera Blue difficult areas between his toes first and third on the left than first and second on the right. I'm going to change to silver alginate from silver collagen. Continue ketoconazole as I suspect underlying tinea pedis 05/26/17; left lateral leg venous insufficiency wound. We've been using Hydrofera Blue. I believe that there is expanding epithelialization over the surface of the wound albeit not coming from the wound circumference. This is [name] bit of an odd situation in which the epithelialization seems to be coming from the surface of the wound rather than in the exact circumference. There is still small open areas mostly along the lateral margin of the wound. ooHe has unchanged areas between the left first and second and the right first second toes which I been treating for tenia pedis 06/02/17; left lateral leg venous insufficiency wound. We have been using Hydrofera Blue. Somewhat smaller from the wound circumference. The surface of the wound remains [name] bit on it almost epithelialized sedation in appearance. I use an open curette today debridement in the surface of all of this especially the edges ooSmall open wounds remaining on the dorsal right first and second toe interspace and the plantar left first second toe and her face on the left 06/09/17; wound on the left lateral leg continues to be smaller but very gradual and very dry surface using Hydrofera  Blue 06/16/17 requires weekly debridements now on the left lateral leg although this continues to contract. I changed to silver collagen last week because of dryness of the wound bed. Using Iodoflex to the areas on his first and second toes/web space bilaterally 06/24/17; patient with history of paraplegia also chronic venous insufficiency with lymphedema. Has [name] very difficult wound on the left lateral leg. This has been gradually reducing in terms of with but comes in with [name] very dry adherent surface. High switch to silver collagen [name] week  or so ago with hydrogel to keep the area moist. This is been refractory to multiple dressing attempts. He also has areas in his first and second toes bilaterally in the anterior and posterior web space. I had been using Iodoflex here after [name] prolonged course of silver alginate with ketoconazole was ineffective [question tinea pedis] 07/14/17; patient arrives today with [name] very difficult adherent material over his left lateral lower leg wound. He also has surrounding erythema and poorly controlled edema. He was switched his Santyl last visit which the nurses are applying once during his doctor visit and once on [name] nurse visit. He was also reduced to 2 layer compression I'm not exactly sure of the issue here. 07/21/17; better surface today after 1 week of Iodoflex. Significant cellulitis that we treated last week also better. [Doxycycline] 07/28/17 better surface today with now 2 weeks of Iodoflex. Significant cellulitis treated with doxycycline. He has now completed the doxycycline and he is back to his usual degree of chronic venous inflammation/stasis dermatitis. He reminds me he has had ablations surgery here 08/04/17; continued improvement with Iodoflex to the left lateral leg wound in terms of the surface of the wound although the dimensions are better. 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  Problematic areas on the plantar aspect of the first second toe web space on the left and the dorsal aspect of the first second toe web space on the right. At one point I felt these were probably related to chronic fungal infections in treated him aggressively for this although we have not made any improvement here. 08/11/17; left lateral leg. Surface continues to improve with the Iodoflex although we are not seeing much improvement in overall wound dimensions. Areas on his plantar left foot and right foot show no improvement. In fact the right foot looks somewhat worse 08/18/17; left lateral leg. We changed to [facility] Blue last week after [name] prolonged course of Iodoflex which helps get the surface better. It appears that the wound with is improved. Continue with difficult areas on the left dorsal first second and plantar first second on the right 09/01/17; patient arrives in clinic today having had [name] temperature of 103 yesterday. He was seen in the ER and [facility]. The patient was concerned he could have cellulitis again in the right leg however they diagnosed him with [name] UTI and he is now on Keflex. He has [name] history of cellulitis which is been recurrent and difficult but this is been in the left leg, in the past 5 use doxycycline. He does in and out catheterizations at home which are risk factors for UTI 09/08/17; patient will be completing his Keflex this weekend. The erythema on the left leg is considerably better. He has [name] new wound today on the medial part of the right leg small superficial almost looks like [name] skin tear. He has worsening of the area on the right dorsal first and second toe. His major area on the left lateral leg is better. Using Hydrofera Blue on all areas 09/15/17; gradual reduction in width on the long wound in the left lateral leg. No debridement required. He also has wounds on the plantar aspect of his left first second toe web space and on the dorsal aspect of the right first  second toe web space. 09/22/17; there continues to be very gradual improvements in the dimensions of the left lateral leg wound. He hasn't round erythematous spot with might  be pressure on his wheelchair. There is no evidence obviously of infection no purulence no warmth ooHe has [name] dry scaled area on the plantar aspect of the left first second toe ooImproved area on the dorsal right first second toe. 09/29/17; left lateral leg wound continues to improve in dimensions mostly with an is still [name] fairly long but increasingly narrow wound. ooHe has [name] dry scaled area on the plantar aspect of his left first second toe web space ooIncreasingly concerning area on the dorsal right first second toe. In fact I am concerned today about possible cellulitis around this wound. The areas extending up his second toe and although there is deformities here almost appears to abut on the nailbed. 10/06/17; left lateral leg wound continues to make very gradual progress. Tissue culture I did from the right first second toe dorsal foot last time grew MRSA and enterococcus which was vancomycin sensitive. This was not sensitive to clindamycin or doxycycline. He is allergic to Zyvox and sulfa we have therefore arrange for him to have dalvance infusion tomorrow. He is had this in the past and tolerated it well 10/20/17; left lateral leg wound continues to make decent progress. This is certainly reduced in terms of with there is advancing epithelialization.ooThe cellulitis in the right foot looks better although he still has [name] deep wound in the dorsal aspect of the first second toe web space. Plantar left first toe web space on the left I think is making some progress 10/27/17; left lateral leg wound continues to make decent progress. Advancing epithelialization.using Hydrofera Blue ooThe right first second toe web space wound is better-looking using silver alginate ooImprovement in the left plantar first second toe web  space. Again using silver alginate 11/03/17 left lateral leg wound continues to make decent progress albeit slowly. Using Hydrofera Blue ooThe right per second toe web space continues to be [name] very problematic looking punched out wound. I obtained [name] piece of tissue for deep culture I did extensively treated this for fungus. It is difficult to imagine that this is [name] pressure area as the patient states other than going outside he doesn't really wear shoes at home ooThe left plantar first second toe web space looked fairly senescent. Necrotic edges. This required debridement oochange to Hydrofera Blue to all wound areas 11/10/17; left lateral leg wound continues to contract. Using Hydrofera Blue ooOn the right dorsal first second toe web space dorsally. Culture I did of this area last week grew MRSA there is not an easy oral option in this patient was multiple antibiotic allergies or intolerances. This was only [name] rare culture isolate I'm therefore going to use Bactroban under silver alginate ooOn the left plantar first second toe web space. Debridement is required here. This is also unchanged 11/17/17; left lateral leg wound continues to contract using Hydrofera Blue this is no longer the major issue. ooThe major concern here is the right first second toe web space. He now has an open area going from dorsally to the plantar aspect. There is now wound on the inner lateral part of the first toe. Not [name] very viable surface on this. There is erythema spreading medially into the forefoot. ooNo major change in the left first second toe plantar wound 11/24/17; left lateral leg wound continues to contract using Hydrofera Blue. Nice improvement today ooThe right first second toe web space all of this looks [name] lot less angry than last week. I have given him clindamycin and topical Bactroban for MRSA and  terbinafine for the possibility of underlining tinea pedis that I could not control with ketoconazole.  Looks somewhat better ooThe area on the plantar left first second toe web space is weeping with dried debris around the wound 12/01/17; left lateral leg wound continues to contract he Hydrofera Blue. It is becoming thinner in terms of with nevertheless it is making good improvement. ooThe right first second toe web space looks less angry but still [name] large necrotic-looking wounds starting on the plantar aspect of the right foot extending between the toes and now extensively on the base of the right second toe. I gave him clindamycin and topical Bactroban for MRSA anterior benefiting for the possibility of underlying tinea pedis. Not looking better today ooThe area on the left first/second toe looks better. Debrided of necrotic debris 12/05/17* the patient was worked in urgently today because over the weekend he found blood on his incontinence bad when he woke up. He was found to have an ulcer by his wife who does most of his wound care. He came in today for [place] to look at this. He has not had [name] history of wounds in his buttocks in spite of his paraplegia. 12/08/17; seen in follow-up today at his usual appointment. He was seen earlier this week and found to have [name] new wound on his buttock. We also follow him for wounds on the left lateral leg, left first second toe web space and right first second toe web space 12/15/17; we have been using Hydrofera Blue to the left lateral leg which has improved. The right first second toe web space has also improved. Left first second toe web space plantar aspect looks stable. The left buttock has worsened using Santyl. Apparently the buttock has drainage 12/22/17; we have been using Hydrofera Blue to the left lateral leg which continues to improve now 2 small wounds separated by normal skin. He tells [place] he had [name] fever up to 100 yesterday he is prone to UTIs but has not noted anything different. He does in and out catheterizations. The area between the first and second  toes today does not look good necrotic surface covered with what looks to be purulent drainage and erythema extending into the third toe. I had gotten this to something that I thought look better last time however it is not look good today. He also has [name] necrotic surface over the buttock wound which is expanded. I thought there might be infection under here so I removed [name] lot of the surface with [name] #5 curet though nothing look like it really needed culturing. He is been using Santyl to this area 12/27/17; his original wound on the left lateral leg continues to improve using Hydrofera Blue. I gave him samples of Baxdella although he was unable to take them out of fear for an allergic reaction ["lump in his throat"].the culture I did of the purulent drainage from his second toe last week showed both enterococcus and [name] set Enterobacter I was also concerned about the erythema on the bottom of his foot although paradoxically although this looks somewhat better today. 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 We've been using silver alginate here. They came up today that he sleeps in [name] recliner, I'm not sure why but I asked him to stop this 01/03/18; his original wound we've been using Hydrofera Blue is now separated into 2 areas. ooUlcer on his left  buttock is better he is off the recliner and sleeping in bed ooFinally both wound areas between his first and second toes also looks some better 01/10/18; his original wound on the left lateral leg is now separated into 2 wounds we've been using Hydrofera Blue ooUlcer on his left buttock has some drainage. There is [name] small probing site going into muscle layer superiorly.using silver alginate -He arrives today with [name] deep tissue injury on the left heel ooThe wound on the dorsal aspect of his first second toe on the left looks [name] lot betterusing silver alginate  ketoconazole ooThe area on the first second toe web space on the right also looks [name] lot bette 01/17/18; his original wound on the left lateral leg continues to progress using Hydrofera Blue ooUlcer on his left buttock also is smaller surface healthier except for [name] small probing site going into the muscle layer superiorly. 2.4 cm of tunneling in this area ooDTI on his left heel we have only been offloading. Looks better than last week no threatened open no evidence of infection oothe wound on the dorsal aspect of the first second toe on the left continues to look like it's regressing we have only been using silver alginate and terbinafine orally ooThe area in the first second toe web space on the right also looks to be [name] lot better using silver alginate and terbinafine I think this was prompted by tinea pedis 01/31/18; the patient was hospitalized in [place] last week apparently for [name] complicated UTI. He was discharged on cefepime he does in and out catheterizations. In the hospital he was discovered M I don't mild elevation of AST and ALT and the terbinafine was stopped.predictably the pressure ulcer on s his buttock looks betterusing silver alginate. The area on the left lateral leg also is better using Hydrofera Blue. The area between the first and second toes on the left better. First and second toes on the right still substantial but better. Finally the DTI on the left heel has held together and looks like it's resolving 02/07/18-he is here in follow-up evaluation for multiple ulcerations. He has new injury to the lateral aspect of the last issue [name] pressure ulcer, he states this is from adhesive removal trauma. He states he has tried multiple adhesive products with no success. All other ulcers appear stable. The left heel DTI is resolving. We will continue with same treatment plan and follow-up next week. 02/14/18; follow-up for multiple areas. ooHe has [name] new area last week on the lateral  aspect of his pressure ulcer more over the posterior trochanter. The original pressure ulcer looks quite stable has healthy granulation. 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 Almost fully epithelialized on the original superior area using Hydrofera Blue ooDTI on the left heel has peeled off this week to reveal [name] small superficial wound under denuded skin and subcutaneous tissue ooBoth areas between the first and second toes look better including nothing open on the left 02/21/18; ooThe patient's wounds on his left ischial tuberosity and posterior left greater trochanter actually looked better. He has [name] large area of irritation around the area which I think is contact dermatitis. I am doubtful that this is fungal ooHis original wound on the left lateral calf continues to improve we have been using Hydrofera Blue ooThere is no open area in the left first second toe web space although there is [name]  lot of thick callus ooThe DTI on the left heel required debridement today of necrotic surface eschar and subcutaneous tissue using silver alginate ooFinally the area on the right first second toe webspace continues to contract using silver alginate and ketoconazole 02/28/18 ooLeft ischial tuberosity wounds look better using silver alginate. ooOriginal wound on the left calf only has one small open area left using Hydrofera Blue ooDTI on the left heel required debridement mostly removing skin from around this wound surface. Using silver alginate ooThe areas on the right first/second toe web space using silver alginate and ketoconazole 03/08/18 on evaluation today patient appears to be doing decently well as best I can tell in regard to his wounds. This is the first time that I have seen him as he generally is followed by Dr. Dellia Nims. With that being said none of his wounds appear to be infected  he does have an area where there is some skin covering what appears to be [name] new wound on the left dorsal surface of his great toe. This is right at the nail bed. With that being said I do believe that debrided away some of the excess skin can be of benefit in this regard. Otherwise he has been tolerating the dressing changes without complication. 03/14/18; patient arrives today with the multiplicity of wounds that we are following. He has not been systemically unwell ooOriginal wound on the left lateral calf now only has 2 small open areas we've been using Hydrofera Blue which should continue ooThe deep tissue injury on the left heel requires debridement today. We've been using silver alginate ooThe left first second toe and the right first second toe are both are reminiscence what I think was tinea pedis. Apparently some of the callus Surface between the toes was removed last week when it started draining. ooPurulent drainage coming from the wound on the ischial tuberosity on the left. 03/21/18-He is here in follow-up evaluation for multiple wounds. 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 He tolerated debridement. The only change to last week's recommendations is to discontinue antifungal cream between toes. He will follow-up next week 03/28/18; following up for multiple wounds;Concern this week is streaking redness and swelling in the right foot. He is going to need antibiotics for this. 03/31/18; follow-up for right foot cellulitis. Streaking redness and swelling in the right foot on 03/28/18. 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 I put him on clindamycin 300 mg every 6 and brought him in for [name] quick check. He has an open wound between his first and second toes on the right foot as [name] potential source. 04/04/18; ooRight foot cellulitis is resolving he is completing clindamycin. This is truly good  news ooLeft lateral calf wound which is initial wound only has one small open area inferiorly this is close to healing out. He has compression stockings. We will use Hydrofera Blue right down to the epithelialization of this ooNonviable surface on the left heel which was initially pressure with [name] DTI. We've been using Hydrofera Blue. I'm going to switch this back to silver alginate ooLeft first second toe/tinea pedis this looks better using silver alginate ooRight first second toe tinea pedis using silver alginate ooLarge pressure ulcers on theLeft ischial tuberosity. Small wound here Looks better. I am uncertain about the surface over the large wound. Using silver alginate 04/11/18; ooCellulitis in the right foot is resolved ooLeft lateral calf wound which was his original  wounds still has 2 tiny open areas remaining this is just about closed ooNonviable surface on the left heel is better but still requires debridement ooLeft first second toe/tinea pedis still open using silver alginate ooRight first second toe wound tinea pedis I asked him to go back to using ketoconazole and silver alginate ooLarge pressure ulcers on the left ischial tuberosity this shear injury here is resolved. Wound is smaller. No evidence of infection using silver alginate 04/18/18; ooPatient arrives with an intense area of cellulitis in the right mid lower calf extending into the right heel area. Bright red and warm. Smaller area on the left anterior leg. He has [name] significant history of MRSA. He will definitely need antibioticsoodoxycycline ooHe now has 2 open areas on the left ischial tuberosity the original large wound and now [name] satellite area which I think was above his initial satellite areas. Not [name] wonderful surface on this satellite area surrounding erythema which looks like pressure related. ooHis left lateral calf wound again his original wound is just about closed ooLeft heel pressure injury still  requiring debridement ooLeft first second toe looks [name] lot better using silver alginate ooRight first second toe also using silver alginate and ketoconazole cream also looks better 04/20/18; the patient was worked in early today out of concerns with his cellulitis on the right leg. I had started him on doxycycline. This was 2 days ago. His wife was concerned about the swelling in the area. Also concerned about the left buttock. He has not been systemically unwell no fever chills. No nausea vomiting or diarrhea 04/25/18; the patient's left buttock wound is continued to deteriorate he is using Hydrofera Blue. He is still completing clindamycin for the cellulitis on the right leg although all of this looks better. 05/02/18 ooLeft buttock wound still with [name] lot of drainage and [name] very tightly adherent fibrinous necrotic surface. He has [name] deeper area superiorly ooThe left lateral calf wound is still closed ooDTI wound on the left heel necrotic surface especially the circumference using Iodoflex ooAreas between his left first second toe and right first second toe both look better. Dorsally and the right first second toe he had [name] necrotic surface although at smaller. In using silver alginate and ketoconazole. I did [name] culture last week which was [name] deep tissue culture of the reminiscence of the open wound on the right first second toe dorsally. This grew [name] few Acinetobacter and [name] few methicillin-resistant staph aureus. Nevertheless the area actually this week looked better. I didn't feel the need to specifically address this at least in terms of systemic antibiotics. 05/09/18; wounds are measuring larger more drainage per our intake. We are using Santyl covered with alginate on the large superficial buttock wounds, Iodosorb on the left heel, ketoconazole and silver alginate to the dorsal first and second toes bilaterally. 05/16/18; ooThe area on his left buttock better in some aspects although the area  superiorly over the ischial tuberosity required an extensive debridement.using Santyl ooLeft heel appears stable. Using Iodoflex ooThe areas between his first and second toes are not bad however there is spreading erythema up the dorsal aspect of his left foot this looks like cellulitis again. He is insensate the erythema is really very brilliant.o Erysipelas He went to see an allergist days ago because he was itching part of this he had lab work done. This showed [name] white count of 15.1 with 70% neutrophils. Hemoglobin of 11.4 and [name] platelet count of 659,000. Last white count we had  in [facility] was [name] 2-1/2 years ago which was 25.9 but he was ill at the time. He was able to show me some lab work that was done by his primary physician the pattern is about the same. 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 But prompted me to go ahead and do x-rays of both feet and the pelvis rule out osteomyelitis. He also had [name] comprehensive metabolic panel this was reasonably normal his albumin was 3.7 liver function tests BUN/creatinine all normal 05/23/18; x-rays of both his feet from last week were negative for underlying pulmonary abnormality. The x-ray of his pelvis however showed mild irregularity in the left ischial which may represent some early osteomyelitis. The wound in the left ischial continues to get deeper clearly now exposed muscle. Each week necrotic surface material over this area. Whereas the rest of the wounds do not look so bad. ooThe left ischial wound we have been using Santyl and calcium alginate ooT the left heel surface necrotic debris using Iodoflex o ooThe left lateral leg is still healed ooAreas on the left dorsal foot and the right dorsal foot are about the same. There is some inflammation on the left which might represent contact dermatitis, fungal dermatitis I am doubtful cellulitis although this looks better than last week 05/30/18; CT scan  done at Hospital did not show any osteomyelitis or abscess. Suggested the possibility of underlying cellulitis although I don't see [name] lot of evidence of this at the bedside ooThe wound itself on the left buttock/upper thigh actually looks somewhat better. No debridement ooLeft heel also looks better no debridement continue Iodoflex ooBoth dorsal first second toe spaces appear better using Lotrisone. Left still required debridement 06/06/18; ooIntake reported some purulent looking drainage from the left gluteal wound. Using Santyl and calcium alginate ooLeft heel looks better although still [name] nonviable surface requiring debridement ooThe left dorsal foot first/second webspace actually expanding and somewhat deeper. I may consider doing [name] shave biopsy of this area ooRight dorsal foot first/second webspace appears stable to improved. Using Lotrisone and silver alginate to both these areas 06/13/18 ooLeft gluteal surface looks better. Now separated in the 2 wounds. No debridement required. Still drainage. We'll continue silver alginate ooLeft heel continues to look better with Iodoflex continue this for at least another week ooOf his dorsal foot wounds the area on the left still has some depth although it looks better than last week. We've been using Lotrisone and silver alginate 06/20/18 ooLeft gluteal continues to look better healthy tissue ooLeft heel continues to look better healthy granulation wound is smaller. He is using Iodoflex and his long as this continues continue the Iodoflex ooDorsal right foot looks better unfortunately dorsal left foot does not. There is swelling and erythema of his forefoot. He had minor trauma to this several days ago but doesn't think this was enough to have caused any tissue injury. Foot looks like cellulitis, we have had this problem before 06/27/18 on evaluation today patient appears to be doing [name] little worse in regard to his foot ulcer. 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 Both of which would really be his only options for treating this infection. In the past he has been given and effusion of Orbactiv. This is done very well for him in the past again it's  one time dosing IV antibiotic therapy. Subsequently I do believe this is something we're gonna need to see about doing at this point in time. Currently his other wounds seem to be doing somewhat better in my pinion I'm pretty happy in that regard. 07/03/18 on evaluation today patient's wounds actually appear to be doing fairly well. He has been tolerating the dressing changes without complication. All in all he seems to be showing signs of improvement. In regard to the antibiotics he has been dealing with infectious disease since I saw him last week as far as getting this scheduled. In the end [facility]'s [facility] this coming Friday. In the meantime he has been continuing to perform the dressing changes in such as previous. There does not appear to be any evidence of infection worsengin at this time. 07/10/18; ooSince I last saw this man 2 weeks ago things have actually improved. IV antibiotics of resulted in less forefoot erythema although there is still some present. He is not systemically unwell ooLeft buttock wounds o2 now have no depth there is increased epithelialization Using silver alginate ooLeft heel still requires debridement using Iodoflex ooLeft dorsal foot still with [name] sizable wound about the size of [name] border but healthy granulation ooRight dorsal foot still with [name] slitlike area using silver alginate 07/18/18; the patient's cellulitis in the left foot is improved in fact I think it is on its way to resolving. ooLeft buttock wounds o2 both look better although the larger one has  hypertension granulation we've been using silver alginate ooLeft heel has some thick circumferential redundant skin over the wound edge which will need to be removed today we've been using Iodoflex ooLeft dorsal foot is still [name] sizable wound required debridement using silver alginate ooThe right dorsal foot is just about closed only [name] small open area remains here 07/25/18; left foot cellulitis is resolved ooLeft buttock wounds o2 both look better. Hyper-granulation on the major area ooLeft heel as some debris over the surface but otherwise looks [name] healthier wound. Using silver collagen ooRight dorsal foot is just about closed 07/31/18; arrives with our intake nurse worried about purulent drainage from the buttock. We had hyper-granulation here last week ooHis buttock wounds o2 continue to look better ooLeft heel some debris over the surface but measuring smaller. ooRight dorsal foot unfortunately has openings between the toes ooLeft foot superficial wound looks less aggravated. 08/07/18 ooButtock wounds continue to look better although some of her granulation and the larger medial wound. silver alginate ooLeft heel continues to look [name] lot better.silver collagen ooLeft foot superficial wound looks less stable. Requires debridement. He has [name] new wound superficial area on the foot on the lateral dorsal foot. ooRight foot looks better using silver alginate without Lotrisone 08/14/2018; patient was in the ER last week diagnosed with [name] UTI. He is now on Cefpodoxime and Macrodantin. ooButtock wounds continued to be smaller. Using silver alginate ooLeft heel continues to look better using silver collagen ooLeft foot superficial wound looks as though it is improving ooRight dorsal foot area is just about healed. 08/21/2018; patient is completed his antibiotics for his UTI. ooHe has 2 open areas on the buttocks. There is still not closed although the surface looks satisfactory. Using  silver alginate ooLeft heel continues to improve using silver collagen ooThe bilateral dorsal foot areas which are at the base of his first and second toes/possible tinea pedis are actually stable on the  left but worse on the right. The area on the left required debridement of necrotic surface. After debridement I obtained [name] specimen for PCR culture. ooThe right dorsal foot which is been just about healed last week is now reopened 08/28/2018; culture done on the left dorsal foot showed coag negative staph both staph epidermidis and Lugdunensis. I think this is worthwhile initiating systemic treatment. I will use doxycycline given his long list of allergies. The area on the left heel slightly improved but still requiring debridement. ooThe large wound on the buttock is just about closed whereas the smaller one is larger. Using silver alginate in this area 09/04/2018; patient is completing his doxycycline for the left foot although this continues to be [name] very difficult wound area with very adherent necrotic debris. We are using silver alginate to all his wounds right foot left foot and the small wounds on his buttock, silver collagen on the left heel. 09/11/2018; once again this patient has intense erythema and swelling of the left forefoot. Lesser degrees of erythema in the right foot. He has [name] long list of allergies and intolerances. I will reinstitute doxycycline. 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 Using silver alginate ooLeft heel also looks better using silver collagen ooUnfortunately both the areas on his feet look worse. The area on the left first second webspace is now gone through to the plantar part of his foot. The area on the left foot anteriorly is irritated with erythema and swelling in the forefoot. 09/25/2018 ooHis wound on the left plantar heel looks better. Using silver collagen ooThe area on the left buttock 2 small  remnant areas. One is closed one is still open. Using silver alginate ooThe areas between both his first and second toes look worse. This in spite of long-standing antifungal therapy with ketoconazole and silver alginate which should have antifungal activity ooHe has small areas around his original wound on the left calf one is on the bottom of the original scar tissue and one superiorly both of these are small and superficial but again given wound history in this site this is worrisome 10/02/2018 ooLeft plantar heel continues to gradually contract using silver collagen ooLeft buttock wound is unchanged using silver alginate ooThe areas on his dorsal feet between his first and second toes bilaterally look about the same. 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 Small open area here. We have been using silver alginate 10/09/2018; ooLeft plantar heel continues to look healthy and contract. No debridement is required ooLeft buttock slightly smaller with [name] tape injury wound just below which was new this week ooDorsal feet somewhat improved I have been using clindamycin ooLeft lateral looks lower extremity the actual open area looks worse although [name] lot of this is epithelialized. I am going to change to silver collagen today He has [name] lot more swelling in the right leg although this is not pitting not red and not particularly warm there is [name] lot of spasm in the right leg usually indicative of people with paralysis of some underlying discomfort. We have reviewed his vascular status from 2017 he had [name] left greater saphenous vein ablation. I wonder about referring him back to vascular surgery if the area on the left leg continues to deteriorate. 10/16/2018 in today for  follow-up and management of multiple lower  extremity ulcers. His left Buttock wound is much lower smaller and almost closed completely. The wound to the left ankle has began to reopen with Epithelialization and some adherent slough. He has multiple new areas to the left foot and leg. The left dorsal foot without much improvement. Wound present between left great webspace and 2nd toe. Erythema and edema present right leg. Right LE ultrasound obtained on 10/10/18 was negative for DVT . 10/23/2018; ooLeft buttock is closed over. Still dry macerated skin but there is no open wound. I suspect this is chronic pressure/moisture ooLeft lateral calf is quite [name] bit worse than when I saw this last. There is clearly drainage here he has macerated skin into the left plantar heel. We will change the primary dressing to alginate ooLeft dorsal foot has some improvement in overall wound area. Still using clindamycin and silver alginate ooRight dorsal foot about the same as the left using clindamycin and silver alginate ooThe erythema in the right leg has resolved. He is DVT rule out was negative ooLeft heel pressure area required debridement although the wound is smaller and the surface is health 10/26/2018 ooThe patient came back in for his nurse check today predominantly because of the drainage coming out of the left lateral leg with [name] recent reopening of his original wound on the left lateral calf. He comes in today with [name] large amount of surrounding erythema around the wound extending from the calf into the ankle and even in the area on the dorsal foot. He is not systemically unwell. He is not febrile. Nevertheless this looks like cellulitis. We have been using silver alginate to the area. I changed him to [name] regular visit and I am going to prescribe him doxycycline. The rationale here is [name] long list of medication intolerances and [name] history of MRSA. I did not see anything that I thought would provide [name] valuable culture 10/30/2018 ooFollow-up from  his appointment 4 days ago with really an extensive area of cellulitis in the left calf left lateral ankle and left dorsal foot. I put him on doxycycline. He has [name] long list of medication allergies which are true allergy reactions. 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 In any case he is [name] lot better today. The erythema especially in the anterior and lateral left calf is better. He still has left ankle erythema. He also is complaining about increasing edema in the right leg we have only been using Kerlix Coban and he has been doing the wraps at home. Finally he has [name] spotty rash on the medial part of his upper left calf which looks like folliculitis or perhaps wrap occlusion type injury. Small superficial macules not pustules 11/06/18 patient arrives today with again [name] considerable degree of erythema around the wound on the left lateral calf extending into the dorsal ankle and dorsal foot. This is [name] lot worse than when I saw this last week. He is on doxycycline really with not [name] lot of improvement. He has not been systemically unwell Wounds on the; left heel actually looks improved. Original area on the left foot and proximity to the first and second toes looks about the same. He has superficial areas on the dorsal foot, anterior calf and then the reopening of his original wound on the left lateral calf which looks about the same ooThe only area he has on the right is the dorsal  webspace first and second which is smaller. ooHe has [name] large area of dry erythematous skin on the left buttock small open area here. 11/13/2018; the patient arrives in much better condition. The erythema around the wound on the left lateral calf is [name] lot better. Not sure whether this was the clindamycin or the TCA and ketoconazole or just in the improvement in edema control [stasis dermatitis]. In any case this is [name] lot better. The area on the left heel  is very small and just about resolved using silver collagen we have been using silver alginate to the areas on his dorsal feet 11/20/2018; his wounds include the left lateral calf, left heel, dorsal aspects of both feet just proximal to the first second webspace. He is stable to slightly improved. I did not think any changes to his dressings were going to be necessary 11/27/2018 he has [name] reopening on the left buttock which is surrounded by what looks like tinea or perhaps some other form of dermatitis. The area on the left dorsal foot has some erythema around it I have marked this area but I am not sure whether this is cellulitis or not. Left heel is not closed. Left calf the reopening is really slightly longer and probably worse 1/13; in general things look better and smaller except for the left dorsal foot. Area on the left heel is just about closed, left buttock looks better only [name] small wound remains in the skin looks better [using Lotrisone] 1/20; the area on the left heel only has [name] few remaining open areas here. Left lateral calf about the same in terms of size, left dorsal foot slightly larger right lateral foot still not closed. The area on the left buttock has no open wound and the surrounding skin looks [name] lot better 1/27; the area on the left heel is closed. Left lateral calf better but still requiring extensive debridements. The area on his left buttock is closed. He still has the open areas on the left dorsal foot which is slightly smaller in the right foot which is slightly expanded. We have been using Iodoflex on these areas as well 2/3; left heel is closed. Left lateral calf still requiring debridement using Iodoflex there is no open area on his left buttock however he has dry scaly skin over [name] large area of this. Not really responding well to the Lotrisone. Finally the areas on his dorsal feet at the level of the first second webspace are slightly smaller on the right and about the same  on the left. Both of these vigorously debrided with Anasept and gauze 2/10; left heel remains closed he has dry erythematous skin over the left buttock but there is no open wound here. Left lateral leg has come in and with. Still requiring debridement we have been using Iodoflex here. Finally the area on the left dorsal foot and right dorsal foot are really about the same extremely dry callused fissured areas. He does not yet have [name] dermatology appointment 2/17; left heel remains closed. He has [name] new open area on the left buttock. The area on the left lateral calf is bigger longer and still covered in necrotic debris. No major change in his foot areas bilaterally. I am awaiting for [name] dermatologist to look on this. We have been using ketoconazole I do not know that this is been doing any good at all. 2/24; left heel remains closed. The left buttock wound that was new reopening last week looks better. The  left lateral calf appears better also although still requires debridement. The major area on his foot is the left first second also requiring debridement. We have been putting Prisma on all wounds. I do not believe that the ketoconazole has done too much good for his feet. He will use Lotrisone I am going to give him [name] 2-week course of terbinafine. We still do not have [name] dermatology appointment 3/2 left heel remains closed however there is skin over bone in this area I pointed this out to him today. The left buttock wound is epithelialized but still does not look completely stable. The area on the left leg required debridement were using silver collagen here. With regards to his feet we changed to Lotrisone last week and silver alginate. 3/9; left heel remains closed. Left buttock remains closed. The area on the right foot is essentially closed. The left foot remains unchanged. Slightly smaller on the left lateral calf. Using silver collagen to both of these areas 3/16-Left heel remains closed. Area  on right foot is closed. Left lateral calf above the lateral malleolus open wound requiring debridement with easy bleeding. Left dorsal wound proximal to first toe also debrided. Left ischial area open new. Patient has been using Prisma with wrapping every 3 days. Dermatology appointment is apparently tomorrow.Patient has completed his terbinafine 2-week course with some apparent improvement according to him, there is still flaking and dry skin in his foot on the left 3/23; area on the right foot is reopened. The area on the left anterior foot is about the same still [name] very necrotic adherent surface. He still has the area on the left leg and reopening is on the left buttock. He apparently saw dermatology although I do not have [name] note. According to the patient who is usually fairly well informed they did not have any good ideas. Put him on oral terbinafine which she is been on before. 3/30; using silver collagen to all wounds. Apparently his dermatologist put him on doxycycline and rifampin presumably some culture grew staph. I do not have this result. He remains on terbinafine although I have used terbinafine on him before 4/6; patient has had [name] fairly substantial reopening on the right foot between the first and second toes. He is finished his terbinafine and I believe is on doxycycline and rifampin still as prescribed by dermatology. We have been using silver collagen to all his wounds although the patient reports that he thinks silver alginate does better on the wounds on his buttock. 4/13; the area on his left lateral calf about the same size but it did not require debridement. ooLeft dorsal foot just proximal to the webspace between the first and second toes is about the same. Still nonviable surface. I note some superficial bronze discoloration of the dorsal part of his foot ooRight dorsal foot just proximal to the first and second toes also looks about the same. I still think there may be  the same discoloration I noted above on the left ooLeft buttock wound looks about the same 4/20; left lateral calf appears to be gradually contracting using silver collagen. 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 The left dorsal foot wound is debrided of tightly adherent necrotic debris and really cleans up quite nicely. The right area is worse with expansion. I did not debride this it is now over the base of the second toe ooThe area on his left buttock is smaller no debridement is required using  silver collagen 5/4; left calf continues to make good progress. ooHe arrives with erythema around the wounds on his dorsal foot which even extends to the plantar aspect. Very concerning for coexistent infection. He is finished the erythromycin I gave him for possible erythrasma this does not seem to have helped. ooThe area on the left foot is about the same base of the dorsal toes ooIs area on the buttock looks improved on the left 5/11; left calf and left buttock continued to make good progress. Left foot is about the same to slightly improved. ooMajor problem is on the right foot. He has not had an x-ray. Deep tissue culture I did last week showed both Enterobacter and E. coli. I did not change the doxycycline I put him on empirically although neither 1 of these were plated to doxycycline. He arrives today with the erythema looking worse on both the dorsal and plantar foot. Macerated skin on the bottom of the foot. he has not been systemically unwell 5/18-Patient returns at 1 week, left calf wound appears to be making some progress, left buttock wound appears slightly worse than last time, left foot wound looks slightly better, right foot redness is marginally better. X-ray of both feet show no air or evidence of osteomyelitis. Patient is finished his Omnicef and terbinafine. He continues to have macerated skin on the bottom of the  left foot as well as right 5/26; left calf wound is better, left buttock wound appears to have multiple small superficial open areas with surrounding macerated skin. X-rays that I did last time showed no evidence of osteomyelitis in either foot. He is finished cefdinir and doxycycline. I do not think that he was on terbinafine. He continues to have [name] large superficial open area on the right foot anterior dorsal and slightly between the first and second toes. I did send him to dermatology 2 months ago or so wondering about whether they would do [name] fungal scraping. I do not believe they did but did do [name] culture. We have been using silver alginate to the toe areas, he has been using antifungals at home topically either ketoconazole or Lotrisone. We are using silver collagen on the left foot, silver alginate on the right, silver collagen on the left lateral leg and silver alginate on the left buttock 6/1; left buttock area is healed. We have the left dorsal foot, left lateral leg and right dorsal foot. We are using silver alginate to the areas on both feet and silver collagen to the area on his left lateral calf 6/8; the left buttock apparently reopened late last week. He is not really sure how this happened. He is tolerating the terbinafine. Using silver alginate to all wounds 6/15; left buttock wound is larger than last week but still superficial. ooCame in the clinic today with [name] report of purulence from the left lateral leg I did not identify any infection ooBoth areas on his dorsal feet appear to be better. He is tolerating the terbinafine. Using silver alginate to all wounds 6/22; left buttock is about the same this week, left calf quite [name] bit better. His left foot is about the same however he comes in with erythema and warmth in the right forefoot once again. Culture that I gave him in the beginning of May showed Enterobacter and E. coli. I gave him doxycycline and things seem to improve although  neither 1 of these organisms was specifically plated. 6/29; left buttock is larger and dry this week. Left lateral  calf looks to me to be improved. Left dorsal foot also somewhat improved right foot completely unchanged. The erythema on the right foot is still present. He is completing the Ceftin dinner that I gave him empirically [see discussion above.) 7/6 - All wounds look to be stable and perhaps improved, the left buttock wound is slightly smaller, per patient bleeds easily, completed ceftin, the right foot redness is less, he is on terbinafine 7/13; left buttock wound about the same perhaps slightly narrower. Area on the left lateral leg continues to narrow. Left dorsal foot slightly smaller right foot about the same. We are using silver alginate on the right foot and Hydrofera Blue to the areas on the left. Unna boot on the left 2 layer compression on the right 7/20; left buttock wound absolutely the same. Area on lateral leg continues to get better. Left dorsal foot require debridement as did the right no major change in the 7/27; left buttock wound the same size necrotic debris over the surface. The area on the lateral leg is closed once again. His left foot looks better right foot about the same although there is some involvement now of the posterior first second toe area. He is still on terbinafine which I have given him for [name] month, not certain [name] centimeter major change 06/25/19-All wounds appear to be slightly improved according to report, left buttock wound looks clean, both foot wounds have minimal to no debris the right dorsal foot has minimal slough. We are using Hydrofera Blue to the left and silver alginate to the right foot and ischial wound. 8/10-Wounds all appear to be around the same, the right forefoot distal part has some redness which was not there before, however the wound looks clean and small. Ischial wound looks about the same with no changes 8/17; his wound on the left  lateral calf which was his original chronic venous insufficiency wound remains closed. Since I last saw him the areas on the left dorsal foot right dorsal foot generally appear better but require debridement. The area on his left initial tuberosity appears somewhat larger to me perhaps hyper granulated and bleeds very easily. We have been using Hydrofera Blue to the left dorsal foot and silver alginate to everything else 8/24; left lateral calf remains closed. The areas on his dorsal feet on the webspace of the first and second toes bilaterally both look better. The area on the left buttock which is the pressure ulcer stage II slightly smaller. I change the dressing to Hydrofera Blue to all areas 8/31; left lateral calf remains closed. The area on his dorsal feet bilaterally look better. Using Hydrofera Blue. Still requiring debridement on the left foot. No change in the left buttock pressure ulcers however 9/14; left lateral calf remains closed. Dorsal feet look quite [name] bit better than 2 weeks ago. Flaking dry skin also [name] lot better with the ammonium lactate I gave him 2 weeks ago. The area on the left buttock is improved. He states that his Roho cushion developed [name] leak and he is getting [name] new one, in the interim he is offloading this vigorously 9/21; left calf remains closed. Left heel which was [name] possible DTI looks better this week. He had macerated tissue around the left dorsal foot right foot looks satisfactory and improved left buttock wound. I changed his dressings to his feet to silver alginate bilaterally. Continuing Hydrofera Blue on the left buttock. 9/28 left calf remains closed. Left heel did not develop anything [possible  DTI] dry flaking skin on the left dorsal foot. Right foot looks satisfactory. Improved left buttock wound. We are using silver alginate on his feet Hydrofera Blue on the buttock. I have asked him to go back to the Lotrisone on his feet including the wounds and  surrounding areas 10/5; left calf remains closed. The areas on the left and right feet about the same. [name] lot of this is epithelialized however debris over the remaining open areas. He is using Lotrisone and silver alginate. The area on the left buttock using Hydrofera Blue 10/26. Patient has been out for 3 weeks secondary to Covid concerns. He tested negative but I think his wife tested positive. He comes in today with the left foot substantially worse, right foot about the same. Even more concerning he states that the area on his left buttock closed over but then reopened and is considerably deeper in one aspect than it was before [stage III wound] 11/2; left foot really about the same as last week. Quarter sized wound on the dorsal foot just proximal to the first second toes. Surrounding erythema with areas of denuded epithelium. This is not really much different looking. Did not look like cellulitis this time however. ooRight foot area about the same.. We have been using silver alginate alginate on his toes ooLeft buttock still substantial irritated skin around the wound which I think looks somewhat better. We have been using Hydrofera Blue here. 11/9; left foot larger than last week and [name] very necrotic surface. Right foot I think is about the same perhaps slightly smaller. Debris around the circumference also addressed. Unfortunately on the left buttock there is been [name] decline. Satellite lesions below the major wound distally and now [name] an additional one posteriorly we have been using Hydrofera Blue but I think this is [name] pressure issue 11/16; left foot ulcer dorsally again [name] very adherent necrotic surface. Right foot is about the same. Not much change in the pressure ulcer on his left buttock. 11/30; left foot ulcer dorsally basically the same as when I saw him 2 weeks ago. Very adherent fibrinous debris on the wound surface. Patient reports [name] lot of drainage as well. The character of this wound  has changed completely although it has always been refractory. We have been using Iodoflex, patient changed back to alginate because of the drainage. Area on his right dorsal foot really looks benign with [name] healthier surface certainly [name] lot better than on the left. Left buttock wounds all improved using Hydrofera Blue 12/7; left dorsal foot again no improvement. Tightly adherent debris. PCR culture I did last week only showed likely skin contaminant. I have gone ahead and done [name] punch biopsy of this which is about the last thing in terms of investigations I can think to do. He has known venous insufficiency and venous hypertension and this could be the issue here. The area on the right foot is about the same left buttock slightly worse according to our intake nurse secondary to [facility] Blue sticking to the wound 12/14; biopsy of the left foot that I did last time showed changes that could be related to wound healing/chronic stasis dermatitis phenomenon no neoplasm. We have been using silver alginate to both feet. I change the one on the left today to Sorbact and silver alginate to his other 2 wounds 12/28; the patient arrives with the following problems; ooMajor issue is the dorsal left foot which continues to be [name] larger deeper wound area. Still with [name]  completely nonviable surface ooParadoxically the area mirror image on the right on the right dorsal foot appears to be getting better. ooHe had some loss of dry denuded skin from the lower part of his original wound on the left lateral calf. Some of this area looked [name] little vulnerable and for this reason we put him in wrap that on this side this week ooThe area on his left buttock is larger. He still has the erythematous circular area which I think is [name] combination of pressure, sweat. This does not look like cellulitis or fungal dermatitis 11/26/2019; -Dorsal left foot large open wound with depth. Still debris over the surface. Using  Sorbact ooThe area on the dorsal right foot paradoxically has closed over [facility] has [name] reopening on the left ankle laterally at the base of his original wound that extended up into the calf. This appears clean. ooThe left buttock wound is smaller but with very adherent necrotic debris over the surface. We have been using silver alginate here as well The patient had arterial studies done in 2017. He had biphasic waveforms at the dorsalis pedis and posterior tibial bilaterally. ABI in the left was 1.17. Digit waveforms were dampened. He has slight spasticity in the great toes I do not think [name] TBI would be possible 1/11; the patient comes in today with [name] sizable reopening between the first and second toes on the right. This is not exactly in the same location where we have been treating wounds previously. According to our intake nurse this was actually fairly deep but 0.6 cm. The area on the left dorsal foot looks about the same the surface is somewhat cleaner using Sorbact, his MRI is in 2 days. We have not managed yet to get arterial studies. The new reopening on the left lateral calf looks somewhat better using alginate. The left buttock wound is about the same using alginate 1/18; the patient had his ARTERIAL studies which were quite normal. ABI in the right at 1.13 with triphasic/biphasic waveforms on the left ABI 1.06 again with triphasic/biphasic waveforms. It would not have been possible to have done [name] toe brachial index because of spasticity. We have been using Sorbac to the left foot alginate to the rest of his wounds on the right foot left lateral calf and left buttock 1/25; arrives in clinic with erythema and swelling of the left forefoot worse over the first MTP area. This extends laterally dorsally and but also posteriorly. Still has an area on the left lateral part of the lower part of his calf wound it is eschared and clearly not closed. ooArea on the left buttock still with surrounding  irritation and erythema. ooRight foot surface wound dorsally. The area between the right and first and second toes appears better. 2/1; ooThe left foot wound is about the same. Erythema slightly better I gave him [name] week of doxycycline empirically ooRight foot wound is more extensive extending between the toes to the plantar surface ooLeft lateral calf really no open surface on the inferior part of his original wound however the entire area still looks vulnerable ooAbsolutely no improvement in the left buttock wound required debridement. 2/8; the left foot is about the same. Erythema is slightly improved I gave him clindamycin last week. ooRight foot looks better he is using Lotrimin and silver alginate ooHe has [name] breakdown in the left lateral calf. Denuded epithelium which I have removed ooLeft buttock about the same were using Hydrofera Blue 2/15; left foot is about the  same there is less surrounding erythema. Surface still has tightly adherent debris which I have debriding however not making any progress ooRight foot has [name] substantial wound on the medial right second toe between the first and second webspace. ooStill an open area on the left lateral calf distal area. ooButtock wound is about the same 2/22; left foot is about the same less surrounding erythema. Surface has adherent debris. Polymen Ag Right foot area significant wound between the first and second toes. We have been using silver alginate here Left lateral leg polymen Ag at the base of his original venous insufficiency wound ooLeft buttock some improvement here 3/1; ooRight foot is deteriorating in the first second toe webspace. Larger and more substantial. We have been using silver alginate. ooLeft dorsal foot about the same markedly adherent surface debris using PolyMem Ag ooLeft lateral calf surface debris using PolyMem AG ooLeft buttock is improved again using PolyMem Ag. ooHe is completing his  terbinafine. The erythema in the foot seems better. He has been on this for 2 weeks 3/8; no improvement in any wound area in fact he has [name] small open area on the dorsal midfoot which is new this week. He has not gotten his foot x-rays yet 3/15; his x-rays were both negative for osteomyelitis of both feet. No major change in any of his wounds on the extremities however his buttock wounds are better. We have been using polymen on the buttocks, left lower leg. Iodoflex on the left foot and silver alginate on the right 3/22; arrives in clinic today with the 2 major issues are the improvement in the left dorsal foot wound which for once actually looks healthy with [name] nice healthy wound surface without debridement. Using Iodoflex here. Unfortunately on the left lateral calf which is in the distal part of his original wound he came to the clinic here for there was purulent drainage noted some increased breakdown scattered around the original area and [name] small area proximally. We we are using polymen here will change to silver alginate today. His buttock wound on the left is better and I think the area on the right first second toe webspace is also improved 3/29; left dorsal foot looks better. Using Iodoflex. Left ankle culture from deterioration last time grew E. coli, Enterobacter and Enterococcus. I will give him [name] course of cefdinir although that will not cover Enterococcus. The area on the right foot in the webspace of the first and second toe lateral first toe looks better. The area on his buttock is about healed Vascular appointment is on April 21. This is to look at his venous system vis--vis continued breakdown of the wounds on the left including the left lateral leg and left dorsal foot he. He has had previous ablations on this side 4/5; the area between the right first and second toes lateral aspect of the first toe looks better. Dorsal aspect of the left first toe on the left foot also improved.  Unfortunately the left lateral lower leg is larger and there is [name] second satellite wound superiorly. The usual superficial abrasions on the left buttock overall better but certainly not closed 4/12; the area between the right first and second toes is improved. Dorsal aspect of the left foot also slightly smaller with [name] vibrant healthy looking surface. No real change in the left lateral leg and the left buttock wound is healed He has an unaffordable co-pay for Apl[employer]. Appointment with vein and vascular with regards to the left leg  venous part of the circulation is on 4/21 4/19; we continue to see improvement in all wound areas. Although this is minor. He has his vascular appointment on 4/21. The area on the left buttock has not reopened although right in the center of this area the skin looks somewhat threatened 4/26; the left buttock is unfortunately reopened. In general his left dorsal foot has [name] healthy surface and looks somewhat smaller although it was not measured as such. The area between his first and second toe webspace on the right as [name] small wound against the first toe. The patient saw vascular surgery. The real question I was asking was about the small saphenous vein on the left. He has previously ablated left greater saphenous vein. Nothing further was commented on on the left. Right greater saphenous vein without reflux at the saphenofemoral junction or proximal thigh there was no indication for ablation of the right greater saphenous vein duplex was negative for DVT bilaterally. They did not think there was anything from [name] vascular surgery point of view that could be offered. They ABIs within normal limits 5/3; only small open area on the left buttock. The area on the left lateral leg which was his original venous reflux is now 2 wounds both which look clean. We are using Iodoflex on the left dorsal foot which looks healthy and smaller. He is down to [name] very tiny area between the right  first and second toes, using silver alginate 5/10; all of his wounds appear better. We have much better edema control in 4 layer compression on the left. This may be the factor that is allowing the left foot and left lateral calf to heal. He has external compression garments at home 04/14/20-All of his wounds are progressing well, the left forefoot is practically closed, left ischium appears to be about the same, right toe webspace is also smaller. The left lateral leg is about the same, continue using Hydrofera Blue to this, silver alginate to the ischium, Iodoflex to the toe space on the right 6/7; most of his wounds outside of the left buttock are doing well. The area on the left lateral calf and left dorsal foot are smaller. The area on the right foot in between the first and second toe webspace is barely visible although he still says there is some drainage here is the only reason I did not heal this out. ooUnfortunately the area on the left buttock almost looks like he has [name] skin tear from tape. He has open wound and then [name] large flap of skin that we are trying to get adherence over an area just next to the remaining wound 6/21; 2 week follow-up. I believe is been here for nurse visits. Miraculously the area between his first and second toes on the left dorsal foot is closed over. Still open on the right first second web space. The left lateral calf has 2 open areas. Distally this is more superficial. The proximal area had [name] little more depth and required debridement of adherent necrotic material. His buttock wound is actually larger we have been using silver alginate here 6/28; the patient's area on the left foot remains closed. Still open wet area between the first and second toes on the right and also extending into the plantar aspect. We have been using silver alginate in this location. He has 2 areas on the left lower leg part of his original long wounds which I think are better. We have  been using Hydrofera  Blue here. Hydrofera Blue to the left buttock which is stable 7/12; left foot remains closed. Left ankle is closed. May be [name] small area between his right first and second toes the only truly open area is on the left buttock. We have been using Hydrofera Blue here 7/19; patient arrives with marked deterioration especially in the left foot and ankle. We did not put him in [name] compression wrap on the left last week in fact he wore his juxta lite stockings on either side although he does not have an underlying stocking. He has [name] reopening on the left dorsal foot, left lateral ankle and [name] new area on the right dorsal ankle. More worrisome is the degree of erythema on the left foot extending on the lateral foot into the lateral lower leg on the left 7/26; the patient had erythema and drainage from the lateral left ankle last week. Culture of this grew MRSA resistant to doxycycline and clindamycin which are the 2 antibiotics we usually use with this patient who has multiple antibiotic allergies including linezolid, trimethoprim sulfamethoxazole. I had give him an empiric doxycycline and he comes in the area certainly looks somewhat better although it is blotchy in his lower leg. He has not been systemically unwell. He has had areas on the left dorsal foot which is [name] reopening, chronic wounds on the left lateral ankle. Both of these I think are secondary to chronic venous insufficiency. The area between his first and second toes is closed as far as I can tell. He had [name] new wrap injury on the right dorsal ankle last week. Finally he has an area on the left buttock. We have been using silver alginate to everything except the left buttock we are using Hydrofera Blue 06/30/20-Patient returns at 1 week, has been given [name] sample dose pack of NUZYRA which is [name] tetracycline derivative [omadacycline], patient has completed those, we have been using silver alginate to almost all the wounds except the left  ischium where we are using Hydrofera Blue all of them look better 8/16; since I last saw the patient he has been doing well. The area on the left buttock, left lateral ankle and left foot are all closed today. He has completed the [place] I gave him last time and tolerated this well. He still has open areas on the right dorsal ankle and in the right first second toe area which we are using silver alginate. 8/23; we put him in his bilateral external compression stockings last week as he did not have anything open on either leg except for concerning area between the right first and second toe. He comes in today with an area on the left dorsal foot slightly more proximal than the original wound, the left lateral foot but this is actually [name] continuation of the area he had on the left lateral ankle from last time. As well he is opened up on the left buttock again. 8/30; comes in today with things looking [name] lot better. The area on the left lower ankle has closed down as has the left foot but with eschar in both areas. The area on the dorsal right ankle is also epithelialized. Very little remaining of the left buttock wound. We have been using silver alginate on all wound areas 9/13; the area in the first second toe webspace on the right has fully epithelialized. He still has some vulnerable epithelium on the right and the ankle and the dorsal foot. He notes weeping. He is using  his juxta lite stocking. On the left again the left dorsal foot is closed left lateral ankle is closed. We went to the juxta lite stocking here as well. ooStill vulnerable in the left buttock although only 2 small open areas remain here 9/27; 2-week follow-up. We did not look at his left leg but the patient says everything is closed. He is [name] bit disturbed by the amount of edema in his left foot he is using juxta lite stockings but asking about over the toes stockings which would be 30/40, will talk to him next time. According to him  there is no open wound on either the left foot or the left ankle/calf He has an open area on the dorsal right calf which I initially point [name] wrap injury. He has superficial remaining wound on the left ischial tuberosity been using silver alginate although he says this sticks to the wound 10/5; we gave him 2-week follow-up but he called yesterday expressing some concerns about his right foot right ankle and the left buttock. He came in early. There is still no open areas on the left leg and that still in his juxta lite stocking 10/11; he only has 1 small area on the left buttock that remains measuring millimeters 1 mm. Still has the same irritated skin in this area. We recommended zinc oxide when this eventually closes and pressure relief is meticulously is he can do this. He still has an area on the dorsal part of his right first through third toes which is [name] bit irritated and still open and on the dorsal ankle near the crease of the ankle. We have been using silver alginate and using his own stocking. He has nothing open on the left leg or foot 10/25; 2-week follow-up. Not nearly as good on the left buttock as I was hoping. For open areas with 5 looking threatened small. He has the erythematous irritated chronic skin in this area. oo1 area on the right dorsal ankle. He reports this area bleeds easily ooRight dorsal foot just proximal to the base of his toes ooWe have been using silver alginate. 11/8; 2-week follow-up. Left buttock is about the same although I do not think the wounds are in the same location we have been using silver alginate. I have asked him to use zinc oxide on the skin around the wounds. ooHe still has [name] small area on the right dorsal ankle he reports this bleeds easily ooRight dorsal foot just proximal to the base of the toes does not have anything open although the skin is very dry and scaly ooHe has [name] new opening on the nailbed of the left great toe. Nothing on the  left ankle 11/29; 3-week follow-up. Left buttock has 2 open areas. And washing of these wounds today started bleeding easily. Suggesting very friable tissue. We have been using silver alginate. Right dorsal ankle which I thought was initially [name] wrap injury we have been using silver alginate. Nothing open between the toes that I can see. He states the area on the left dorsal toe nailbed healed after the last visit in 2 or 3 days 12/13; 3-week follow-up. His left buttock now has 3 open areas but the original 2 areas are smaller using polymen here. Surrounding skin looks better. The right dorsal ankle is closed. He has [name] small opening on the right dorsal foot at the level of the third toe. In general the skin looks better here. He is wearing his juxta lite stocking on  the left leg says there is nothing open 11/24/2020; 3 weeks follow-up. His left buttock still has the 3 open areas. We have been using polymen but due to lack of response he changed to [facility] area. Surrounding skin is dry erythematous and irritated looking. There is no evidence of infection either bacterial or fungal however there is loss of surface epithelium ooHe still has very dry skin in his foot causing irritation and erythema on the dorsal part of his toes. This is not responded to prolonged courses of antifungal simply looks dry and irritated 1/24; left buttock area still looks about the same he was unable to find the triad ointment that we had suggested. The area on the right lower leg just above the dorsal ankle has reopened and the areas on the right foot between the first second and second third toes and scaling on the bottom of the foot has been about the same for quite some time now. been using silver alginate to all wound areas 2/7; left buttock wound looked quite good although not much smaller in terms of surface area surrounding skin looks better. Only [name] few dry flaking areas on the right foot in between the first  and second toes the skin generally looks better here [ammonium lactate]. Finally the area on the right dorsal ankle is closed 2/21; ooThere is no open area on the right foot even between the right first and second toe. Skin around this area dorsally and plantar aspects look better. ooHe has [name] reopening of the area on the right ankle just above the crease of the ankle dorsally. I continue to think that this is probably friction from spasms may be even this time with his stocking under the compression stockings. ooWounds on his left buttock look about the same there [name] couple of areas that have reopened. He has [name] total square area of loss of epithelialization. This does not look like infection it looks like [name] contact dermatitis but I just cannot determine to what 3/14; there is nothing on the right foot between the first and second toes this was carefully inspected under illumination. Some chronic irritation on the dorsal part of his foot from toes 1-3 at the base. Nothing really open here substantially. Still has an area on the right foot/ankle that is actually larger and hyper granulated. His buttock area on the left is just about closed however he has chronic inflammation with loss of the surface epithelial layer 3/28; 2-week follow-up. In clinic today with [name] new wound on the left anterior mid tibia. Says this happened about 2 weeks ago. He is not really sure how wonders about the spasticity of his legs at night whether that could have caused this other than that he does not have [name] good idea. He has been using topical antibiotics and silver alginate. The area on his right dorsal ankle seems somewhat better. ooFinally everything on his left buttock is closed. 4/11; 2-week follow-up. All of his wounds are better except for the area over the ischium and left buttock which have opened up widely again. At least part of this is covered in necrotic fibrinous material another part had rolled nonviable  skin. The area on the right ankle, left anterior mid tibia are both [name] lot better. He had no open wounds on either foot including the areas between the first and second toes 4/25; patient presents for 2-week follow-up. He states that the wounds are overall stable. He has no complaints today and states  he is using Hydrofera Blue to open wounds. 5/9; have not seen this man in over [name] month. For my memory he has open areas on the left mid tibia and right ankle. T oday he has new open area on the right dorsal foot which we have not had [name] problem with recently. He has the sustained area on the left buttock He is also changed his insurance at the beginning of the year [employer]. We will need prior authorizations for debridement 5/23; patient presents for 2-week follow-up. He has prior authorizations for debridement. He denies any issues in the past 2 weeks with his wound care. He has been using Hydrofera Blue to all the wounds. He does report [name] circular rash to the upper left leg that is new. He denies acute signs of infection. 6/6; 2-week follow-up. The patient has open wounds on the left buttock which are worse than the last time I saw this about [name] month ago. He also has [name] new area to me on the left anterior mid tibia with some surrounding erythema. The area on the dorsal ankle on the right is closed but I think this will be [name] friction injury every time this area is exposed to either our wraps or his compression stockings caused by unrelenting spasms in this leg. 6/20; 2-week follow-up. oo The patient has open wounds on the left buttock which is about the same. Using [facility] here. - The left mid tibia has [name] static amount of surrounding erythema. Also [name] raised area in the center. We have been using Hydrofera Blue here. ooooFinally he has broken down in his dorsal right foot extending between the first and second toes and going to the base of the first and second toe webspace. I have previously  assumed that this was severe venous hypertension 7/5; 2-week follow-up oo The left buttock wound actually looks better. We are using Hydrofera Blue. He has extensive skin irritation around this area and I have not really been able to get that any better. I have tried Lotrisone i.e. antifungals and steroids. More most recently we have just been using Coloplast really looks about the same. oo The left mid tibia which was new last week culture to have very resistant staph aureus. Not only methicillin-resistant but doxycycline resistant. The patient has [name] plethora of antibiotic allergies including sulfa, linezolid. I used topical bacitracin on this but he has not started this yet. oo In addition he has an expanding area of erythema with [name] wound on the dorsal right foot. I did [name] deep tissue culture of this area today 7/12; oo Left buttock area actually looks better surrounding skin also looks less irritated. oo Left mid tibia looks about the same. He is using bacitracin this is not worse oo Right dorsal foot looks about the same as well. oo The left first toe also looks about the same 7/19; left buttock wound continues to improve in terms of open areas oo Left mid tibia is still concerning amount of swelling he is using bacitracin oo Dorsal left first toe somewhat smaller oo Right dorsal foot somewhat smaller 7/25; left buttock wound actually continues to improve oo Left mid tibia area has less swelling. I gave him all my samples of new Nuzyra. This seems to have helped although the wound is still open it. His abrasion closed by here oo Left dorsal great toe really no better. Still [name] very nonviable surface oo Right dorsal foot perhaps some better. oo We have been  using bacitracin and silver nitrate to the areas on his lower legs and Hydrofera Blue to the area on the buttock. 8/16 oo Disappointed that his left buttock wound is actually more substantial. Apparently during the last  nurse visit these were both very small. He has continued irritation to [name] large area of skin on his buttock. I have never been able to totally explain this although I think it some combination of the way he sits, pressure, moisture. He is not incontinent enough to contribute to this. oo Left dorsal great toe still fibrinous debris on the surface that I have debrided today oo Large area across the dorsal right toes. oo The area on the left anterior mid tibia has less swelling. He completed the [place]. This does not look infected although the tissue is still fried 8/30; 2-week follow-up. oo Left buttock areas not improved. We used Hydrofera Blue on this. Weeping wet with the surrounding erythema that I have not been able to control even with Lotrisone and topical Coloplast oo Left dorsal great toe looks about the same oo More substantial area again at the base of his toes on the left which is new this week. oo Area across the dorsal right toes looks improved oo The left anterior mid tibia looks like it is trying to close 9/13; 2-week follow-up. Using silver alginate on all of his wounds. The left dorsal foot does not look any better. He has the area on the dorsal toe and also the areas at the base of all of his toes 1 through 3. On the right foot he has [name] similar pattern in [name] similar area. He has the area on his left mid tibia that looks fairly healthy. Finally the area on his left buttock looks somewhat bette 9/20; culture I did of the left foot which was [name] deep tissue culture last time showed E. coli he has erythema around this wound. Still [name] completely necrotic surface. His right dorsal foot looks about the same. He has [name] very friable surface to the left anterior mid tibia. Both buttock wounds look better. We have been using silver alginate to all wounds 10/4; he has completed the cephalexin that I gave him last time for the left foot. He is using topical gentamicin under silver alginate  silver alginate being applied to all the wounds. Unfortunately all the wounds look irritated on his dorsal right foot dorsal left foot left mid tibia. I wonder if this could be [name] silver allergy. I am going to change him to [facility] on the lower extremity. The skin on the left buttock and left posterior thigh still flaking dry and irritated. This has continued no matter what I have applied topically to this. He has [name] solitary open wound which by itself does not look too bad however the entire area of surrounding skin does not change no matter what we have applied here 10/18; the area on the left dorsal foot and right dorsal foot both look better. The area on the right extends into the plantar but not between his toes. We have been using silver alginate. He still has [name] rectangular erythematous area around the area on the left tibia. The wound itself is very small. Finally everything on his left buttock looks [name] little larger the skin is erythematous 11/15; patient comes in with the left dorsal and right dorsal foot distally looking somewhat better. Still nonviable surface on the left foot which required debridement. He still has the area on the  left anterior mid tibia although this looks somewhat better. He has [name] new area on the right lateral lower leg just above the ankle. Finally his left buttock looks terrible with multiple superficial open wounds geometric square shaped area of chronic erythema which I have not been able to sort through 11/29; right dorsal foot and left dorsal foot both look somewhat better. No debridement required. He has the fragile area on the left anterior mid tibia this looks and continues to look somewhat better. Right lateral lower leg just above the ankle we identified last time also looks better. In general the area on his left buttock looks improved. We are using Hydrofera Blue to all wound areas 12/13; right dorsal foot looks better. The area on the right lateral leg  is healed. Left dorsal foot has 2 open areas both of which require debridement. The fragile area on the left anterior mid tibial looks better. Smaller area on his buttocks. Were using Hydrofera Blue Objective Constitutional Sitting or standing Blood Pressure is within target range for patient.. Pulse regular and within target range for patient.Marland Kitchen Respirations regular, non-labored and within target range.. Temperature is normal and within the target range for the patient.Marland Kitchen Appears in no distress. Vitals Time Taken: 8:12 AM, Height: 70 in, Weight: 216 lbs, BMI: 31, Temperature: 98.3 F, Pulse: 137 bpm, Respiratory Rate: 17 breaths/min, Blood Pressure: 133/74 mmHg. General Notes: Wound exam; right dorsal foot looks some better healthy no debridement. The left dorsal foot is 2 wounds. Both have thick adherent debris subcutaneous debridement here to remove this. Hemostasis with direct pressure. oo Left buttock wound is smaller. The skin is still very erythematous dry and flaky. I have not been able to fix this Integumentary (Hair, Skin) Wound #41R status is Open. Original cause of wound was Gradually Appeared. The date acquired was: 03/16/2020. The wound has been in treatment 85 weeks. The wound is located on the Left Ischium. The wound measures 1.5cm length x 0.4cm width x 0.1cm depth; 0.471cm^2 area and 0.047cm^3 volume. There is Fat Layer (Subcutaneous Tissue) exposed. There is no tunneling or undermining noted. There is [name] medium amount of sanguinous drainage noted. The wound margin is distinct with the outline attached to the wound base. There is large (67-100%) red, friable granulation within the wound bed. There is no necrotic tissue within the wound bed. Wound #51 status is Healed - Epithelialized. Original cause of wound was Trauma. The date acquired was: 01/26/2021. The wound has been in treatment 37 weeks. The wound is located on the Left,Anterior Lower Leg. The wound measures 0cm length x 0cm  width x 0cm depth; 0cm^2 area and 0cm^3 volume. There is [name] medium amount of serosanguineous drainage noted. Wound #52 status is Open. Original cause of wound was Gradually Appeared. The date acquired was: 03/27/2021. The wound has been in treatment 31 weeks. The wound is located on the Right,Dorsal Foot. The wound measures 1cm length x 4cm width x 0.1cm depth; 3.142cm^2 area and 0.314cm^3 volume. There is Fat Layer (Subcutaneous Tissue) exposed. There is no tunneling or undermining noted. There is [name] medium amount of purulent drainage noted. The wound margin is distinct with the outline attached to the wound base. There is large (67-100%) red granulation within the wound bed. There is [name] small (1-33%) amount of necrotic tissue within the wound bed including Adherent Slough. Wound #55 status is Healed - Epithelialized. Original cause of wound was Blister. The date acquired was: 05/26/2021. The wound has been in treatment 23  weeks. The wound is located on the Left T Great. The wound measures 0cm length x 0cm width x 0cm depth; 0cm^2 area and 0cm^3 volume. There is [name] medium oe amount of serosanguineous drainage noted. Wound #56 status is Open. Original cause of wound was Gradually Appeared. The date acquired was: 07/11/2021. The wound has been in treatment 15 weeks. The wound is located on the Left,Dorsal Foot. The wound measures 2.5cm length x 1.6cm width x 0.1cm depth; 3.142cm^2 area and 0.314cm^3 volume. There is Fat Layer (Subcutaneous Tissue) exposed. There is no tunneling or undermining noted. There is [name] medium amount of purulent drainage noted. The wound margin is distinct with the outline attached to the wound base. There is large (67-100%) red granulation within the wound bed. There is [name] small (1-33%) amount of necrotic tissue within the wound bed including Adherent Slough. Wound #57 status is Open. Original cause of wound was Gradually Appeared. The date acquired was: 08/25/2021. The wound has been in  treatment 10 weeks. The wound is located on the [place]. The wound measures 0.2cm length x 0.2cm width x 0.2cm depth; 0.031cm^2 area and 0.006cm^3 volume. There is Fat Layer (Subcutaneous Tissue) exposed. There is no tunneling or undermining noted. There is [name] medium amount of purulent drainage noted. The wound margin is distinct with the outline attached to the wound base. There is medium (34-66%) red granulation within the wound bed. There is [name] medium (34- 66%) amount of necrotic tissue within the wound bed including Adherent Slough. Wound #58 status is Healed - Epithelialized. Original cause of wound was Gradually Appeared. The date acquired was: 09/23/2021. The wound has been in treatment 4 weeks. The wound is located on the Right,Lateral Lower Leg. The wound measures 0cm length x 0cm width x 0cm depth; 0cm^2 area and 0cm^3 volume. There is [name] medium amount of serosanguineous drainage noted. Wound #59 status is Open. Original cause of wound was Pressure Injury. The date acquired was: 11/03/2021. The wound is located on the Left,Proximal,Dorsal Foot. The wound measures 1.1cm length x 0.9cm width x 0.1cm depth; 0.778cm^2 area and 0.078cm^3 volume. There is no tunneling or undermining noted. There is [name] medium amount of serosanguineous drainage noted. The wound margin is distinct with the outline attached to the wound base. There is medium (34- 66%) red, pink granulation within the wound bed. There is [name] medium (34-66%) amount of necrotic tissue within the wound bed including Adherent Slough. 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 There was [name] Excisional Skin/Subcutaneous Tissue Debridement with [name] total area of 4 sq cm performed by Ricard Dillon., MD. With the following instrument(s): Curette to remove Viable and Non-Viable tissue/material. Material removed includes Subcutaneous Tissue, Slough, Skin: Dermis, and Skin: Epidermis after achieving pain control using Lidocaine. No specimens were taken. [name] time out was conducted at 08:40, prior to the start of the procedure. [name] Minimum amount of bleeding was controlled with Pressure. The procedure was  tolerated well with [name] pain level of 0 throughout and [name] pain level of 0 following the procedure. Post Debridement Measurements: 2.5cm length x 1.6cm width x 0.1cm depth; 0.314cm^3 volume. Character of Wound/Ulcer Post Debridement is improved. Post procedure Diagnosis Wound #56: Same as Pre-Procedure Wound #59 Pre-procedure diagnosis of Wound #59 is [name] Pressure Ulcer located on the Left,Proximal,Dorsal Foot . There was [name] Excisional Skin/Subcutaneous Tissue Debridement with [name] total area of 0.99 sq cm performed by Ricard Dillon., MD. With the following instrument(s): Curette to remove Viable and Non-Viable tissue/material. Material removed includes Subcutaneous Tissue, Slough, Skin: Dermis, and Skin: Epidermis after achieving pain control using Lidocaine. No specimens were taken. [name] time out was conducted at 08:40, prior to the start of the procedure. [name] Minimum amount of bleeding was controlled with Pressure. The procedure was tolerated well with [name] pain level of 0 throughout and [name] pain level of 0 following the procedure. Post Debridement Measurements: 1.1cm length x 0.9cm width x 0.1cm depth; 0.078cm^3 volume. Post debridement  Stage noted as Category/Stage II. Character of Wound/Ulcer Post Debridement is improved. Post procedure Diagnosis Wound #59: Same as Pre-Procedure Plan Follow-up Appointments: Return appointment in 3 weeks. - Dr. Dellia Nims Bathing/ Shower/ Hygiene: May shower and wash wound with soap and water. - on days that dressing is changed Edema Control - Lymphedema / SCD / Other: Elevate legs to the level of the heart or above for 30 minutes daily and/or when sitting, [name] frequency of: - throughout the day Compression stocking or Garment 30-40 mm/Hg pressure to: - Juxtalite to both legs daily Off-Loading: Roho cushion for wheelchair Turn and reposition every 2 hours WOUND #41R: - Ischium Wound Laterality: Left Cleanser: Soap and Water Every Other Day/30 Days Discharge Instructions: May shower and wash wound with dial antibacterial soap and water prior to dressing change. Peri-Wound Care: Triad Hydrophilic Wound Dressing Tube, 6 (oz) Every Other Day/30 Days Discharge Instructions: Apply to periwound with each dressing change Prim Dressing: Hydrofera Blue Ready Foam, 2.5 x2.5 in Every Other Day/30 Days ary Discharge Instructions: Apply to wound bed as instructed Secondary Dressing: ABD Pad, 5x9 Every Other Day/30 Days Discharge Instructions: Apply over primary dressing as directed. Secured With: 27M Medipore H Soft Cloth Surgical T 4 x 2 (in/yd) Every Other Day/30 Days ape Discharge Instructions: Secure dressing with tape as directed. WOUND #52: - Foot Wound Laterality: Dorsal, Right Cleanser: Soap and Water Every Other Day/30 Days Discharge Instructions: May shower and wash wound with dial antibacterial soap and water prior to dressing change. Topical: Gentamicin Every Other Day/30 Days Discharge Instructions: As directed by physician Prim Dressing: Hydrofera Blue Ready Foam, 2.5 x2.5 in Every Other Day/30 Days ary Discharge Instructions: Apply to wound bed as instructed Secondary Dressing: Woven  Gauze Sponge, Non-Sterile 4x4 in Every Other Day/30 Days Discharge Instructions: Apply over primary dressing as directed. Secured With: [employer], 4.5x3.1 (in/yd) Every Other Day/30 Days Discharge Instructions: Secure with Kerlix as directed. Secured With: Transpore Surgical T ape, 2x10 (in/yd) Every Other Day/30 Days Discharge Instructions: Secure dressing with tape as directed. WOUND #56: - Foot Wound Laterality: Dorsal, Left Cleanser: Soap and Water Every Other Day/30 Days Discharge Instructions: May shower and wash wound with dial antibacterial soap and water prior to dressing change. Topical: Gentamicin Every Other Day/30 Days Discharge Instructions: Apply directly to wound bed, under alginate Prim Dressing: Hydrofera Blue Classic Foam, 4x4 in Every Other Day/30 Days ary Discharge Instructions: Moisten with saline prior to applying to wound bed  Secondary Dressing: Woven Gauze Sponge, Non-Sterile 4x4 in Every Other Day/30 Days Discharge Instructions: Apply over primary dressing as directed. Secured With: [employer], 4.5x3.1 (in/yd) Every Other Day/30 Days Discharge Instructions: Secure with Kerlix as directed. Secured With: Transpore Surgical T ape, 2x10 (in/yd) Every Other Day/30 Days Discharge Instructions: Secure dressing with tape as directed. WOUND #57: - Foot Wound Laterality: Plantar, Right Cleanser: Soap and Water Every Other Day/30 Days Discharge Instructions: May shower and wash wound with dial antibacterial soap and water prior to dressing change. Prim Dressing: Hydrofera Blue Ready Foam, 2.5 x2.5 in Every Other Day/30 Days ary Discharge Instructions: Apply to wound bed as instructed Secondary Dressing: Woven Gauze Sponge, Non-Sterile 4x4 in Every Other Day/30 Days Discharge Instructions: Apply over primary dressing as directed. Secured With: [employer], 4.5x3.1 (in/yd) Every Other Day/30 Days Discharge Instructions: Secure with Kerlix as  directed. Secured With: 20M Medipore H Soft Cloth Surgical T 4 x 2 (in/yd) Every Other Day/30 Days ape Discharge Instructions: Secure dressing with tape as directed. WOUND #59: - Foot Wound Laterality: Dorsal, Left, Proximal Cleanser: Soap and Water Every Other Day/30 Days Discharge Instructions: May shower and wash wound with dial antibacterial soap and water prior to dressing change. Prim Dressing: Hydrofera Blue Ready Foam, 2.5 x2.5 in Every Other Day/30 Days ary Discharge Instructions: Apply to wound bed as instructed Secondary Dressing: Woven Gauze Sponge, Non-Sterile 4x4 in Every Other Day/30 Days Discharge Instructions: Apply over primary dressing as directed. Secured With: [employer], 4.5x3.1 (in/yd) Every Other Day/30 Days Discharge Instructions: Secure with Kerlix as directed. Secured With: 20M Medipore H Soft Cloth Surgical T 4 x 2 (in/yd) Every Other Day/30 Days ape Discharge Instructions: Secure dressing with tape as directed. 1. I have continued with Hydrofera Blue 2. The patient is using his own stockings edema control is good. 3. Still Hydrofera Blue to the left buttock. He has dry erythematous chronically irritated looking skin over [name] large surface area of his buttock. No doubt this contributes to wounds. He denies any urinary incontinence moisture although I think think this is probably chronic pressure friction and possibly sweating. I have treated him in the past for tinea I wonder about doing this again if we can get the wounds to heal Electronic Signature(s) Signed: 11/03/2021 4:30:32 PM By: Linton Ham MD Entered By: Linton Ham on 11/03/2021 08:54:24 -------------------------------------------------------------------------------- SuperBill Details Patient Name: Date of Service: [name], [name] 11/03/2021 Medical Record Number: [id number] Patient Account Number: [id number] Date of Birth/Sex: Treating RN: 22-Jul-1986 (35 y.o. [name]: [place], [place] Other Clinician: Referring Provider: Treating Provider/Extender: [name]: 304 Diagnosis Coding ICD-10 Codes Code Description I87.332 Chronic venous hypertension (idiopathic) with ulcer and inflammation of left lower extremity L97.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, stage 3 L97.821 Non-pressure chronic ulcer of other part of left lower leg limited to breakdown of skin L97.521 Non-pressure chronic ulcer of other part of left foot limited to breakdown of skin L97.818 Non-pressure chronic ulcer of other part of right lower leg with other 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, complete Facility Procedures CPT4 Code: [id number] Description: 26834 - DEB SUBQ TISSUE 20 SQ CM/< ICD-10 Diagnosis Description L97.521 Non-pressure chronic ulcer of other part of left foot limited to breakdown of Modifier: skin Quantity: 1 Physician Procedures Electronic Signature(s) Signed: 11/03/2021 4:30:32 PM By: Linton Ham MD Entered By: Linton Ham  on 11/03/2021 08:54:41

## 2021-11-24 ENCOUNTER — Encounter (HOSPITAL_BASED_OUTPATIENT_CLINIC_OR_DEPARTMENT_OTHER): Payer: BC Managed Care – PPO | Admitting: Internal Medicine

## 2021-11-24 DIAGNOSIS — J069 Acute upper respiratory infection, unspecified: Secondary | ICD-10-CM | POA: Diagnosis not present

## 2021-12-01 ENCOUNTER — Other Ambulatory Visit: Payer: Self-pay

## 2021-12-01 ENCOUNTER — Other Ambulatory Visit (HOSPITAL_BASED_OUTPATIENT_CLINIC_OR_DEPARTMENT_OTHER): Payer: Self-pay | Admitting: Internal Medicine

## 2021-12-01 ENCOUNTER — Encounter (HOSPITAL_BASED_OUTPATIENT_CLINIC_OR_DEPARTMENT_OTHER): Payer: BC Managed Care – PPO | Attending: Internal Medicine | Admitting: Internal Medicine

## 2021-12-01 DIAGNOSIS — I87332 Chronic venous hypertension (idiopathic) with ulcer and inflammation of left lower extremity: Secondary | ICD-10-CM | POA: Diagnosis not present

## 2021-12-01 DIAGNOSIS — L98499 Non-pressure chronic ulcer of skin of other sites with unspecified severity: Secondary | ICD-10-CM | POA: Diagnosis not present

## 2021-12-01 DIAGNOSIS — L89323 Pressure ulcer of left buttock, stage 3: Secondary | ICD-10-CM | POA: Insufficient documentation

## 2021-12-01 DIAGNOSIS — G8221 Paraplegia, complete: Secondary | ICD-10-CM | POA: Insufficient documentation

## 2021-12-01 DIAGNOSIS — L03115 Cellulitis of right lower limb: Secondary | ICD-10-CM | POA: Insufficient documentation

## 2021-12-01 DIAGNOSIS — L97511 Non-pressure chronic ulcer of other part of right foot limited to breakdown of skin: Secondary | ICD-10-CM | POA: Insufficient documentation

## 2021-12-01 DIAGNOSIS — G6289 Other specified polyneuropathies: Secondary | ICD-10-CM | POA: Diagnosis not present

## 2021-12-01 DIAGNOSIS — L97821 Non-pressure chronic ulcer of other part of left lower leg limited to breakdown of skin: Secondary | ICD-10-CM | POA: Insufficient documentation

## 2021-12-01 DIAGNOSIS — L97522 Non-pressure chronic ulcer of other part of left foot with fat layer exposed: Secondary | ICD-10-CM | POA: Diagnosis not present

## 2021-12-01 DIAGNOSIS — L97521 Non-pressure chronic ulcer of other part of left foot limited to breakdown of skin: Secondary | ICD-10-CM | POA: Diagnosis not present

## 2021-12-01 DIAGNOSIS — L97518 Non-pressure chronic ulcer of other part of right foot with other specified severity: Secondary | ICD-10-CM | POA: Diagnosis not present

## 2021-12-01 NOTE — Progress Notes (Signed)
[name], SYED [name] ([id number]) Visit Report for 12/01/2021 Arrival Information Details Patient Name: Date of Service: [name], [name] 12/01/2021 8:00 [name] Medical Record Number: [id number] Patient Account Number: [id number] Date of Birth/Sex: Treating RN: 10/06/1986 (36 y.o. [name] Primary Care Andrews Tener: [name], [name] Other Clinician: Referring Nimra Puccinelli: Treating Jules Baty/Extender: [name]: 39 Visit Information History Since Last Visit Added or deleted any medications: No Patient Arrived: Wheel Chair Any new allergies or adverse reactions: No Arrival Time: 07:57 Had [name] fall or experienced change in No Accompanied By: alone activities of daily living that may affect Transfer Assistance: None risk of falls: Patient Identification Verified: Yes Signs or symptoms of abuse/neglect since last visito No Secondary Verification Process Completed: Yes Hospitalized since last visit: No Patient Requires Transmission-Based Precautions: No Implantable device outside of the clinic excluding No Patient Has Alerts: Yes cellular tissue based products placed in the center Patient Alerts: R ABI = 1.0 since last visit: L ABI = 1.1 Has Dressing in Place as Prescribed: Yes Pain Present Now: No Electronic Signature(s) Signed: 12/01/2021 5:00:37 PM By: Levan Hurst RN, BSN Entered By: Levan Hurst on 12/01/2021 07:58:19 -------------------------------------------------------------------------------- Encounter Discharge Information Details Patient Name: Date of Service: [name], [name] 12/01/2021 8:00 [name] Medical Record Number: [id number] Patient Account Number: [id number] Date of Birth/Sex: Treating RN: 1986-06-27 (36 y.o. [name] Primary Care Tiny Chaudhary: [place], [place] Other Clinician: Referring Statia Burdick: Treating Braiden Rodman/Extender: [name]: 308 Encounter Discharge Information Items Post Procedure  Vitals Discharge Condition: Stable Temperature (F): 97.7 Ambulatory Status: Wheelchair Pulse (bpm): 120 Discharge Destination: Home Respiratory Rate (breaths/min): 18 Transportation: Private Auto Blood Pressure (mmHg): 135/72 Accompanied By: alone Schedule Follow-up Appointment: Yes Clinical Summary of Care: Patient Declined Electronic Signature(s) Signed: 12/01/2021 5:00:37 PM By: Levan Hurst RN, BSN Entered By: Levan Hurst on 12/01/2021 11:54:01 -------------------------------------------------------------------------------- Lower Extremity Assessment Details Patient Name: Date of Service: [name], [name] 12/01/2021 8:00 [name] Medical Record Number: [id number] Patient Account Number: [id number] Date of Birth/Sex: Treating RN: 1986-03-28 (36 y.o. [name] Primary Care Long Brimage: [place], [place] Other Clinician: Referring Christyan Reger: Treating Lerin Jech/Extender: [name]: 308 Edema Assessment Assessed: [Left: No] [Right: No] Edema: [Left: Yes] [Right: Yes] Calf Left: Right: Point of Measurement: 33 cm From Medial Instep 29 cm 32 cm Ankle Left: Right: Point of Measurement: 10 cm From Medial Instep 25 cm 25 cm Vascular Assessment Pulses: Dorsalis Pedis Palpable: [Left:Yes] [Right:Yes] Electronic Signature(s) Signed: 12/01/2021 5:00:37 PM By: Levan Hurst RN, BSN Entered By: Levan Hurst on 12/01/2021 08:04:05 -------------------------------------------------------------------------------- Multi Wound Chart Details Patient Name: Date of Service: [name], [name] 12/01/2021 8:00 [name] Medical Record Number: [id number] Patient Account Number: [id number] Date of Birth/Sex: Treating RN: [birth year] (35 y.o. [name] Primary Care Wisdom Rickey: [name], [name] Other Clinician: Referring Kosisochukwu Burningham: Treating Shirla Hodgkiss/Extender: Dutch Gray, [name]: 308 Vital Signs Height(in): 70 Pulse(bpm):  120 Weight(lbs): 216 Blood Pressure(mmHg): 135/72 Body Mass Index(BMI): 31 Temperature(F): 97.7 Respiratory Rate(breaths/min): 18 Photos: [41R:Left Ischium] [51R:Left, Anterior Lower Leg] [52:Right, Dorsal Foot] Wound Location: [41R:Gradually Appeared] [51R:Trauma] [52:Gradually Appeared] Wounding Event: [41R:Pressure Ulcer] [51R:Trauma, Other] [52:Trauma, Other] Primary Etiology: [41R:Sleep Apnea, Hypertension, Paraplegia Sleep Apnea, Hypertension, Paraplegia Sleep Apnea, Hypertension, Paraplegia] Comorbid History: [41R:03/16/2020] [51R:01/26/2021] [52:03/27/2021] Date Acquired: [41R:89] [51R:41] [52:35] Weeks of Treatment: [41R:Open] [51R:Open] [52:Open] Wound Status: [41R:Yes] [51R:Yes] [52:No] Wound Recurrence: [41R:Yes] [51R:No] [52:No] Clustered Wound: [41R:2] [51R:[name]] [52:[name]] Clustered Quantity: [41R:7.6x4.4x0.1] [51R:1.1x0.6x0.1] [52:1.2x5.3x0.1] Measurements  L x W x D (cm) [41R:26.264] [51R:0.518] [52:4.995] [name] (cm) : rea [41R:2.626] [51R:0.052] [52:0.5] Volume (cm) : [41R:-1354.30%] [51R:-57.00%] [52:-165.00%] % Reduction in [name] rea: [41R:-1350.80%] [51R:-57.60%] [52:-166.00%] % Reduction in Volume: [41R:Category/Stage II] [51R:Full Thickness Without Exposed] [52:Full Thickness Without Exposed] Classification: [41R:Medium] [51R:Support Structures Medium] [52:Support Structures Medium] Exudate [name] mount: [41R:Sanguinous] [51R:Serosanguineous] [52:Serosanguineous] Exudate Type: [41R:red] [51R:red, brown] [52:red, brown] Exudate Color: [41R:Distinct, outline attached] [51R:Flat and Intact] [52:Distinct, outline attached] Wound Margin: [41R:Large (67-100%)] [51R:Large (67-100%)] [52:Small (1-33%)] Granulation [name] mount: [41R:Red, Friable] [51R:Red] [52:Pink, Pale] Granulation Quality: [41R:None Present (0%)] [51R:None Present (0%)] [52:Small (1-33%)] Necrotic [name] mount: [41R:Fat Layer (Subcutaneous Tissue): Yes Fat Layer (Subcutaneous Tissue): Yes Fat Layer (Subcutaneous Tissue):  Yes] Exposed Structures: [41R:Fascia: No Tendon: No Muscle: No Joint: No Bone: No None] [51R:Fascia: No Tendon: No Muscle: No Joint: No Bone: No None] [52:Fascia: No Tendon: No Muscle: No Joint: No Bone: No None] Epithelialization: [41R:[name]] [51R:[name]] [52:[name]] Debridement: [41R:[name]] [51R:[name]] [52:[name]] Tissue Debrided: [41R:[name]] [51R:[name]] [52:[name]] Level: [41R:[name]] [51R:[name]] [52:[name]] Debridement [name] (sq cm): [41R:rea [name]] [51R:[name]] [52:[name]] Instrument: [41R:[name]] [51R:[name]] [52:[name]] Bleeding: [41R:[name]] [51R:[name]] [52:[name]] Hemostasis [name] chieved: [41R:[name]] [51R:[name]] [52:[name]] Procedural Pain: [41R:[name]] [51R:[name]] [52:[name]] Post Procedural Pain: Debridement Treatment Response: [name] [51R:[name]] [52:[name]] Post Debridement Measurements L x [name] [51R:[name]] [52:[name]] W x D (cm) [41R:[name]] [51R:[name]] [52:[name]] Post Debridement Volume: (cm) [41R:Biopsy] [51R:[name]] [52:[name]] Wound Number: [id number] Photos: No Photos Left, Dorsal Foot Right, Plantar Foot Left, Proximal, Dorsal Foot Wound Location: Gradually Appeared Gradually Appeared Pressure Injury Wounding Event: 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 Primary Etiology: Sleep Apnea, Hypertension, Paraplegia Sleep Apnea, Hypertension, Paraplegia [name] Comorbid History: 07/11/2021 08/25/2021 11/03/2021 Date Acquired: 19 14 4  Weeks of Treatment: Open Open Converted Wound Status: No No No Wound Recurrence: No No No Clustered Wound: [name] [name] [name] Clustered Quantity: 4.4x1.6x0.1 1.4x3x0.1 0x0x0 Measurements L x W x D (cm) 5.529 3.299 0 [name] (cm) : rea 0.553 0.33 0 Volume (cm) : 11.10% -223.10% 100.00% % Reduction in [name] rea: 11.10% -223.50% 100.00% % Reduction in Volume: Full Thickness Without Exposed Full Thickness Without Exposed Category/Stage II Classification: Support Structures Support Structures Medium Medium Medium Exudate Amount: Serosanguineous Serosanguineous Serosanguineous Exudate Type: red, brown red,  brown red, brown Exudate Color: Distinct, outline attached Distinct, outline attached [name] Wound Margin: Small (1-33%) Medium (34-66%) [name] Granulation Amount: Pink, Pale Pink [name] Granulation Quality: Large (67-100%) Medium (34-66%) [name] Necrotic Amount: Fat Layer (Subcutaneous Tissue): Yes Fat Layer (Subcutaneous Tissue): Yes [name] Exposed Structures: Fascia: No Fascia: No Tendon: No Tendon: No Muscle: No Muscle: No Joint: No Joint: No Bone: No Bone: No Small (1-33%) Small (1-33%) [name] Epithelialization: Debridement - Excisional [name] [name] Debridement: Pre-procedure Verification/Time Out 08:30 [name] [name] Taken: Subcutaneous, Slough [name] [name] Tissue Debrided: Skin/Subcutaneous Tissue [name] [name] Level: 7.04 [name] [name] Debridement [name] (sq cm): rea Curette [name] [name] Instrument: Minimum [name] [name] Bleeding: Pressure [name] [name] Hemostasis [name] chieved: 0 [name] [name] Procedural Pain: 0 [name] [name] Post Procedural Pain: Procedure was tolerated well [name] [name] Debridement Treatment Response: 4.4x1.6x0.1 [name] [name] Post Debridement Measurements L x W x D (cm) 0.553 [name] [name] Post Debridement Volume: (cm) Debridement [name] [name] Procedures Performed: Treatment Notes Electronic Signature(s) Signed: 12/01/2021 4:28:08 PM By: Linton Ham MD Signed: 12/01/2021 5:00:37 PM By: Levan Hurst RN, BSN Entered By: Linton Ham on 12/01/2021 08:56:02 -------------------------------------------------------------------------------- Multi-Disciplinary Care Plan Details Patient Name: Date of Service: [name], [name] 12/01/2021 8:00 [name] Medical Record Number: [id number] Patient Account Number: [id number] Date of Birth/Sex: Treating RN:  04-26-1986 (36 y.o. [name] Primary Care Furqan Gosselin: [name], [name] Other Clinician: Referring Weltha Cathy: Treating Anniah Glick/Extender: [name]: [place] reviewed with physician Active Inactive Wound/Skin  Impairment Nursing Diagnoses: Impaired tissue integrity Knowledge deficit related to ulceration/compromised skin integrity Goals: Patient/caregiver will verbalize understanding of skin care regimen Date Initiated: 01/05/2016 Target Resolution Date: 12/18/2021 Goal Status: Active Ulcer/skin breakdown will have [name] volume reduction of 30% by week 4 Date Initiated: 01/05/2016 Date Inactivated: 12/22/2017 Target Resolution Date: 01/19/2018 Unmet Reason: complex wounds, Goal Status: Unmet infection Interventions: Assess patient/caregiver ability to obtain necessary supplies Assess ulceration(s) every visit Provide education on ulcer and skin care Notes: 02/02/21: Complex Care, ongoing. Electronic Signature(s) Signed: 12/01/2021 5:00:37 PM By: Levan Hurst RN, BSN Entered By: Levan Hurst on 12/01/2021 08:32:19 -------------------------------------------------------------------------------- Pain Assessment Details Patient Name: Date of Service: [name], [name] 12/01/2021 8:00 [name] Medical Record Number: [id number] Patient Account Number: [id number] Date of Birth/Sex: Treating RN: 08-21-1986 (36 y.o. [name] Primary Care Tesneem Dufrane: [place], [place] Other Clinician: Referring Maor Meckel: Treating Malajah Oceguera/Extender: [name]: 308 Active Problems Location of Pain Severity and Description of Pain Patient Has Paino No Site Locations Pain Management and Medication Current Pain Management: Electronic Signature(s) Signed: 12/01/2021 5:00:37 PM By: Levan Hurst RN, BSN Entered By: Levan Hurst on 12/01/2021 08:03:57 -------------------------------------------------------------------------------- Patient/Caregiver Education Details Patient Name: Date of Service: [name], [name] 1/10/2023andnbsp8:00 [name] Medical Record Number: [id number] Patient Account Number: [id number] Date of Birth/Gender: Treating RN: 21-Mar-1986 (36 y.o. [name] Primary Care Physician: Janine Limbo Other Clinician: Referring Physician: Treating Physician/Extender: [name]: 61 Education Assessment Education Provided To: Patient Education Topics Provided Wound/Skin Impairment: Methods: Explain/Verbal Responses: State content correctly Electronic Signature(s) Signed: 12/01/2021 5:00:37 PM By: Levan Hurst RN, BSN Entered By: Levan Hurst on 12/01/2021 08:32:49 -------------------------------------------------------------------------------- Wound Assessment Details Patient Name: Date of Service: [name], [name] 12/01/2021 8:00 [name] Medical Record Number: [id number] Patient Account Number: [id number] Date of Birth/Sex: Treating RN: 05/15/1986 (36 y.o. [name] Primary Care Dejaun Vidrio: [name], [name] Other Clinician: Referring Omaria Plunk: Treating Tequan Redmon/Extender: [name]: 308 Wound Status Wound Number: [id number] Primary Etiology: Pressure Ulcer Wound Location: Left Ischium Wound Status: Open Wounding Event: Gradually Appeared Comorbid History: Sleep Apnea, Hypertension, Paraplegia Date Acquired: 03/16/2020 Weeks Of Treatment: 89 Clustered Wound: Yes Photos Wound Measurements Length: (cm) 7.6 Width: (cm) 4.4 Depth: (cm) 0.1 Clustered Quantity: 2 Area: (cm) 26.264 Volume: (cm) 2.626 % Reduction in Area: -1354.3% % Reduction in Volume: -1350.8% Epithelialization: None Wound Description Classification: Category/Stage II Wound Margin: Distinct, outline attached Exudate Amount: Medium Exudate Type: Sanguinous Exudate Color: red Foul Odor After Cleansing: No Slough/Fibrino No Wound Bed Granulation Amount: Large (67-100%) Exposed Structure Granulation Quality: Red, Friable Fascia Exposed: No Necrotic Amount: None Present (0%) Fat Layer (Subcutaneous Tissue) Exposed: Yes Tendon Exposed: No Muscle Exposed: No Joint Exposed:  No Bone Exposed: No Treatment Notes Wound #41R (Ischium) Wound Laterality: Left Cleanser Soap and Water Discharge Instruction: May shower and wash wound with dial antibacterial soap and water prior to dressing change. Peri-Wound Care Triad Hydrophilic Wound Dressing Tube, 6 (oz) Discharge Instruction: Apply to periwound with each dressing change Topical Primary Dressing Hydrofera Blue Ready Foam, 2.5 x2.5 in Discharge Instruction: Apply to wound bed as instructed Secondary Dressing ABD Pad, 5x9 Discharge Instruction: Apply over primary dressing as directed. Secured With 20M Medipore [employer] [facility]  T 4 x 2 (in/yd) ape Discharge Instruction: Secure dressing with tape as directed. Compression Wrap Compression Stockings Add-Ons Electronic Signature(s) Signed: 12/01/2021 3:01:20 PM By: Sandre Kitty Signed: 12/01/2021 5:00:37 PM By: Levan Hurst RN, BSN Entered By: Sandre Kitty on 12/01/2021 08:18:11 -------------------------------------------------------------------------------- Wound Assessment Details Patient Name: Date of Service: [name], [name] 12/01/2021 8:00 [name] Medical Record Number: [id number] Patient Account Number: [id number] Date of Birth/Sex: Treating RN: October 17, 1986 (36 y.o. [name] Primary Care Mahin Guardia: [name], [name] Other Clinician: Referring Decklyn Hornik: Treating Mikiala Fugett/Extender: [name]: 308 Wound Status Wound Number: [id number] Primary Etiology: Trauma, Other Wound Location: Left, Anterior Lower Leg Wound Status: Open Wounding Event: Trauma Comorbid History: Sleep Apnea, Hypertension, Paraplegia Date Acquired: 01/26/2021 Weeks Of Treatment: 41 Clustered Wound: No Photos Wound Measurements Length: (cm) 1.1 Width: (cm) 0.6 Depth: (cm) 0.1 Area: (cm) 0.518 Volume: (cm) 0.052 % Reduction in Area: -57% % Reduction in Volume: -57.6% Epithelialization: None Tunneling: No Undermining: No Wound  Description Classification: Full Thickness Without Exposed Support Structures Wound Margin: Flat and Intact Exudate Amount: Medium Exudate Type: Serosanguineous Exudate Color: red, brown Foul Odor After Cleansing: No Slough/Fibrino No Wound Bed Granulation Amount: Large (67-100%) Exposed Structure Granulation Quality: Red Fascia Exposed: No Necrotic Amount: None Present (0%) Fat Layer (Subcutaneous Tissue) Exposed: Yes Tendon Exposed: No Muscle Exposed: No Joint Exposed: No Bone Exposed: No Treatment Notes Wound #51R (Lower Leg) Wound Laterality: Left, Anterior Cleanser Soap and Water Discharge Instruction: May shower and wash wound with dial antibacterial soap and water prior to dressing change. Peri-Wound Care Topical Primary Dressing Hydrofera Blue Ready Foam, 2.5 x2.5 in Discharge Instruction: Apply to wound bed as instructed Secondary Dressing Zetuvit Plus Silicone Border Dressing 4x4 (in/in) Discharge Instruction: Apply silicone border over primary dressing as directed. Secured With 11M Medipore H Soft [employer] Surgical T 4 x 2 (in/yd) ape Discharge Instruction: Secure dressing with tape as directed. Compression Wrap Compression Stockings Add-Ons Electronic Signature(s) Signed: 12/01/2021 3:01:20 PM By: Sandre Kitty Signed: 12/01/2021 5:00:37 PM By: Levan Hurst RN, BSN Entered By: Sandre Kitty on 12/01/2021 08:20:27 -------------------------------------------------------------------------------- Wound Assessment Details Patient Name: Date of Service: [name], [name] 12/01/2021 8:00 [name] Medical Record Number: [id number] Patient Account Number: [id number] Date of Birth/Sex: Treating RN: 1986-04-17 (36 y.o. [name] Primary Care Jaquell Seddon: [name], [name] Other Clinician: Referring Jermond Burkemper: Treating Winola Drum/Extender: [name]: 308 Wound Status Wound Number: [id number] Primary Etiology: Trauma, Other Wound  Location: Right, Dorsal Foot Wound Status: Open Wounding Event: Gradually Appeared Comorbid History: Sleep Apnea, Hypertension, Paraplegia Date Acquired: 03/27/2021 Weeks Of Treatment: 35 Clustered Wound: No Photos Wound Measurements Length: (cm) 1.2 Width: (cm) 5.3 Depth: (cm) 0.1 Area: (cm) 4.995 Volume: (cm) 0.5 % Reduction in Area: -165% % Reduction in Volume: -166% Epithelialization: None Tunneling: No Undermining: No Wound Description Classification: Full Thickness Without Exposed Support Structures Wound Margin: Distinct, outline attached Exudate Amount: Medium Exudate Type: Serosanguineous Exudate Color: red, brown Foul Odor After Cleansing: No Slough/Fibrino Yes Wound Bed Granulation Amount: Small (1-33%) Exposed Structure Granulation Quality: Pink, Pale Fascia Exposed: No Necrotic Amount: Small (1-33%) Fat Layer (Subcutaneous Tissue) Exposed: Yes Necrotic Quality: Adherent Slough Tendon Exposed: No Muscle Exposed: No Joint Exposed: No Bone Exposed: No Treatment Notes Wound #52 (Foot) Wound Laterality: Dorsal, Right Cleanser Soap and Water Discharge Instruction: May shower and wash wound with dial antibacterial soap and water prior to dressing change. Peri-Wound Care Topical Primary Dressing Hydrofera Blue Ready Foam, 2.5  x2.5 in Discharge Instruction: Apply to wound bed as instructed Secondary Dressing Woven Gauze Sponge, Non-Sterile 4x4 in Discharge Instruction: Apply over primary dressing as directed. Secured With [employer], 4.5x3.1 (in/yd) Discharge Instruction: Secure with Kerlix as directed. Transpore Surgical Tape, 2x10 (in/yd) Discharge Instruction: Secure dressing with tape as directed. Compression Wrap Compression Stockings Add-Ons Electronic Signature(s) Signed: 12/01/2021 3:01:20 PM By: Sandre Kitty Signed: 12/01/2021 5:00:37 PM By: Levan Hurst RN, BSN Entered By: Sandre Kitty on 12/01/2021  08:21:11 -------------------------------------------------------------------------------- Wound Assessment Details Patient Name: Date of Service: [name], [name] 12/01/2021 8:00 [name] Medical Record Number: [id number] Patient Account Number: [id number] Date of Birth/Sex: Treating RN: 12/03/1985 (37 y.o. [name] Primary Care Brodrick Curran: [name], [name] Other Clinician: Referring Kensly Bowmer: Treating Zenith Kercheval/Extender: [name]: 308 Wound Status Wound Number: 56 Primary Etiology: Neuropathic Ulcer-Non Diabetic Wound Location: Left, Dorsal Foot Wound Status: Open Wounding Event: Gradually Appeared Comorbid History: Sleep Apnea, Hypertension, Paraplegia Date Acquired: 07/11/2021 Weeks Of Treatment: 19 Clustered Wound: No Photos Wound Measurements Length: (cm) 4.4 Width: (cm) 1.6 Depth: (cm) 0.1 Area: (cm) 5.529 Volume: (cm) 0.553 % Reduction in Area: 11.1% % Reduction in Volume: 11.1% Epithelialization: Small (1-33%) Tunneling: No Undermining: No Wound Description Classification: Full Thickness Without Exposed Support Structures Wound Margin: Distinct, outline attached Exudate Amount: Medium Exudate Type: Serosanguineous Exudate Color: red, brown Foul Odor After Cleansing: No Slough/Fibrino Yes Wound Bed Granulation Amount: Small (1-33%) Exposed Structure Granulation Quality: Pink, Pale Fascia Exposed: No Necrotic Amount: Large (67-100%) Fat Layer (Subcutaneous Tissue) Exposed: Yes Necrotic Quality: Adherent Slough Tendon Exposed: No Muscle Exposed: No Joint Exposed: No Bone Exposed: No Treatment Notes Wound #56 (Foot) Wound Laterality: Dorsal, Left Cleanser Soap and Water Discharge Instruction: May shower and wash wound with dial antibacterial soap and water prior to dressing change. Peri-Wound Care Topical Primary Dressing Hydrofera Blue Classic Foam, 4x4 in Discharge Instruction: Moisten with saline prior to  applying to wound bed Secondary Dressing Woven Gauze Sponge, Non-Sterile 4x4 in Discharge Instruction: Apply over primary dressing as directed. Secured With [employer], 4.5x3.1 (in/yd) Discharge Instruction: Secure with Kerlix as directed. Transpore Surgical Tape, 2x10 (in/yd) Discharge Instruction: Secure dressing with tape as directed. Compression Wrap Compression Stockings Add-Ons Electronic Signature(s) Signed: 12/01/2021 3:01:20 PM By: Sandre Kitty Signed: 12/01/2021 5:00:37 PM By: Levan Hurst RN, BSN Entered By: Sandre Kitty on 12/01/2021 08:21:50 -------------------------------------------------------------------------------- Wound Assessment Details Patient Name: Date of Service: [name], [name] 12/01/2021 8:00 [name] Medical Record Number: [id number] Patient Account Number: [id number] Date of Birth/Sex: Treating RN: 12-21-1985 (36 y.o. [name] Primary Care Camyla Camposano: [place], [place] Other Clinician: Referring Arron Mcnaught: Treating Sanford Lindblad/Extender: [name]: 308 Wound Status Wound Number: 57 Primary Etiology: Neuropathic Ulcer-Non Diabetic Wound Location: Right, Plantar Foot Wound Status: Open Wounding Event: Gradually Appeared Comorbid History: Sleep Apnea, Hypertension, Paraplegia Date Acquired: 08/25/2021 Weeks Of Treatment: 14 Clustered Wound: No Photos Wound Measurements Length: (cm) 1.4 Width: (cm) 3 Depth: (cm) 0.1 Area: (cm) 3.299 Volume: (cm) 0.33 Wound Description Classification: Full Thickness Without Exposed Support Structures Wound Margin: Distinct, outline attached Exudate Amount: Medium Exudate Type: Serosanguineous Exudate Color: red, brown Foul Odor After Cleansing: No Slough/Fibrino Ye % Reduction in Area: -223.1% % Reduction in Volume: -223.5% Epithelialization: Small (1-33%) Tunneling: No Undermining: No s Wound Bed Granulation Amount: Medium (34-66%) Exposed  Structure Granulation Quality: Pink Fascia Exposed: No Necrotic Amount: Medium (34-66%) Fat Layer (Subcutaneous Tissue) Exposed: Yes Necrotic Quality: Adherent Slough Tendon  Exposed: No Muscle Exposed: No Joint Exposed: No Bone Exposed: No Treatment Notes Wound #57 (Foot) Wound Laterality: Plantar, Right Cleanser Soap and Water Discharge Instruction: May shower and wash wound with dial antibacterial soap and water prior to dressing change. Peri-Wound Care Topical Primary Dressing Hydrofera Blue Ready Foam, 2.5 x2.5 in Discharge Instruction: Apply to wound bed as instructed Secondary Dressing Woven Gauze Sponge, Non-Sterile 4x4 in Discharge Instruction: Apply over primary dressing as directed. Secured With [employer], 4.5x3.1 (in/yd) Discharge Instruction: Secure with Kerlix as directed. 66M Medipore H Soft Cloth Surgical T 4 x 2 (in/yd) ape Discharge Instruction: Secure dressing with tape as directed. Compression Wrap Compression Stockings Add-Ons Electronic Signature(s) Signed: 12/01/2021 5:00:37 PM By: Levan Hurst RN, BSN Entered By: Levan Hurst on 12/01/2021 08:21:54 -------------------------------------------------------------------------------- Wound Assessment Details Patient Name: Date of Service: Mottern, [name] 12/01/2021 8:00 [name] Medical Record Number: [id number] Patient Account Number: [id number] Date of Birth/Sex: Treating RN: [birth year] (36 y.o. [name] Primary Care Mahmud Keithly: [place], [place] Other Clinician: Referring Jaliana Medellin: Treating Ercel Pepitone/Extender: [name]: 308 Wound Status Wound Number: 59 Primary Etiology: Pressure Ulcer Wound Location: Left, Proximal, Dorsal Foot Wound Status: Converted Wounding Event: Pressure Injury Date Acquired: 11/03/2021 Weeks Of Treatment: 4 Clustered Wound: No Wound Measurements Length: (cm) Width: (cm) Depth: (cm) Area: (cm) Volume: (cm) 0 %  Reduction in Area: 100% 0 % Reduction in Volume: 100% 0 0 0 Wound Description Classification: Category/Stage II Exudate Amount: Medium Exudate Type: Serosanguineous Exudate Color: red, brown Treatment Notes Wound #59 (Foot) Wound Laterality: Dorsal, Left, Proximal Cleanser Peri-Wound Care Topical Primary Dressing Secondary Dressing Secured With Compression Wrap Compression Stockings Add-Ons Electronic Signature(s) Signed: 12/01/2021 5:00:37 PM By: Levan Hurst RN, BSN Entered By: Levan Hurst on 12/01/2021 08:18:03 -------------------------------------------------------------------------------- [place] Details Patient Name: Date of Service: Swiech, [name] 12/01/2021 8:00 [name] Medical Record Number: [id number] Patient Account Number: [id number] Date of Birth/Sex: Treating RN: 14-May-1986 (36 y.o. [name] Primary Care Brandalynn Ofallon: [place], [place] Other Clinician: Referring Pius Byrom: Treating Ariany Kesselman/Extender: [name]: 308 Vital Signs Time Taken: 07:57 Temperature (F): 97.7 Height (in): 70 Pulse (bpm): 120 Weight (lbs): 216 Respiratory Rate (breaths/min): 18 Body Mass Index (BMI): 31 Blood Pressure (mmHg): 135/72 Reference Range: 80 - 120 mg / dl Electronic Signature(s) Signed: 12/01/2021 5:00:37 PM By: Levan Hurst RN, BSN Entered By: Levan Hurst on 12/01/2021 08:03:38

## 2021-12-01 NOTE — Progress Notes (Signed)
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 situation in which the epithelialization seems to be coming from the surface of the wound rather than in the exact circumference. There is still small open areas mostly along the lateral margin of the wound. He has unchanged areas between the left first and second and the right first second toes which I been treating for tenia pedis 06/02/17; left lateral leg venous insufficiency wound. We have been using Hydrofera Blue. Somewhat smaller from the wound circumference. The surface of the wound remains [name] bit on it almost epithelialized sedation in appearance. I use an open curette today debridement in the surface of all of this especially the edges Small open wounds remaining on the dorsal right first and second toe interspace and the plantar left first second toe and her face on the left 06/09/17; wound on the left lateral leg continues to be smaller but very gradual and very dry surface using Hydrofera Blue 06/16/17 requires weekly debridements now on the left lateral leg although this continues to contract. I changed to silver collagen last week because of dryness of the wound bed. Using Iodoflex to the areas on his first and second toes/web space bilaterally 06/24/17; patient with history of paraplegia also chronic venous insufficiency with lymphedema. Has [name] very difficult wound on the left lateral leg. This has been gradually reducing in terms of with but comes in with [name] very dry adherent surface. High switch to silver collagen [name] week or so ago with hydrogel to keep the area moist.  This is been refractory to multiple dressing attempts. He also has areas in his first and second toes bilaterally in the anterior and posterior web space. I had been using Iodoflex here after [name] prolonged course of silver alginate with ketoconazole was ineffective [question tinea pedis] 07/14/17; patient arrives today with [name] very difficult adherent material over his left lateral lower leg wound. He also has surrounding erythema and poorly controlled edema. He was switched his Santyl last visit which the nurses are applying once during his doctor visit and once on [name] nurse visit. He was also reduced to 2 layer compression I'm not exactly sure of the issue here. 07/21/17; better surface today after 1 week of Iodoflex. Significant cellulitis that we treated last week also better. [Doxycycline] 07/28/17 better surface today with now 2 weeks of Iodoflex. Significant cellulitis treated with doxycycline. He has now completed the doxycycline and he is back to his usual degree of chronic venous inflammation/stasis dermatitis. He reminds me he has had ablations surgery here 08/04/17; continued improvement with Iodoflex to the left lateral leg wound in terms of the surface of the wound although the dimensions are better. 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 Problematic areas on the plantar aspect of the first second toe web space on the left and the dorsal aspect of the first second toe web space on the right. At one point I felt these were probably related to chronic fungal infections in treated him aggressively for this although we have not made any improvement here. 08/11/17; left lateral leg. Surface continues to improve with the Iodoflex although we are not seeing much improvement in overall wound dimensions. Areas on his plantar left foot and right foot show no improvement. In fact the right foot looks somewhat worse 08/18/17; left lateral  leg. We changed to [facility] Blue last week after [name] prolonged course of Iodoflex which helps get the surface better. It appears that the wound with is improved. Continue with difficult areas on the left dorsal first second and plantar first second on the right 09/01/17; patient arrives in clinic today having had [name] temperature of 103 yesterday. He was seen in the ER and [facility]. The patient was concerned he could have cellulitis again in the right leg however they diagnosed him with [name] UTI and he is now on Keflex. He has [name] history of cellulitis which is been recurrent and difficult but this is been in the left leg, in the past 5 use doxycycline. He does in and out catheterizations at home which are risk factors for UTI 09/08/17; patient will be completing his Keflex this weekend. The erythema on the left leg is considerably better. He has [name] new wound today on the medial part of the right leg small superficial almost looks like [name] skin tear. He has worsening of the area on the right dorsal first and second toe. His major area on the left lateral leg is better. Using Hydrofera Blue on all areas 09/15/17; gradual reduction in width on the long wound in the left lateral leg. No debridement required. He also has wounds on the plantar aspect of his left first second toe web space and on the dorsal aspect of the right first second toe web space. 09/22/17; there continues to be very gradual improvements in the dimensions of the left lateral leg wound. He hasn't round erythematous spot with might be pressure on his wheelchair. There is no evidence obviously of  infection no purulence no warmth He has [name] dry scaled area on the plantar aspect of the left first second toe Improved area on the dorsal right first second toe. 09/29/17; left lateral leg wound continues to improve in dimensions mostly with an is still [name] fairly long but increasingly narrow wound. He has [name] dry scaled area on the plantar aspect of his left  first second toe web space Increasingly concerning area on the dorsal right first second toe. In fact I am concerned today about possible cellulitis around this wound. The areas extending up his second toe and although there is deformities here almost appears to abut on the nailbed. 10/06/17; left lateral leg wound continues to make very gradual progress. Tissue culture I did from the right first second toe dorsal foot last time grew MRSA and enterococcus which was vancomycin sensitive. This was not sensitive to clindamycin or doxycycline. He is allergic to Zyvox and sulfa we have therefore arrange for him to have dalvance infusion tomorrow. He is had this in the past and tolerated it well 10/20/17; left lateral leg wound continues to make decent progress. This is certainly reduced in terms of with there is advancing epithelialization.The cellulitis in the right foot looks better although he still has [name] deep wound in the dorsal aspect of the first second toe web space. Plantar left first toe web space on the left I think is making some progress 10/27/17; left lateral leg wound continues to make decent progress. Advancing epithelialization.using Hydrofera Blue The right first second toe web space wound is better-looking using silver alginate Improvement in the left plantar first second toe web space. Again using silver alginate 11/03/17 left lateral leg wound continues to make decent progress albeit slowly. Using [facility] The right per second toe web space continues to be [name] very problematic looking punched out wound. I obtained [name] piece of tissue for deep culture I did extensively treated this for fungus. It is difficult to imagine that this is [name] pressure area as the patient states other than going outside he doesn't really wear shoes at home The left plantar first second toe web space looked fairly senescent. Necrotic edges. This required debridement change to [facility] Blue to all wound  areas 11/10/17; left lateral leg wound continues to contract. Using Hydrofera Blue On the right dorsal first second toe web space dorsally. Culture I did of this area last week grew MRSA there is not an easy oral option in this patient was multiple antibiotic allergies or intolerances. This was only [name] rare culture isolate I'm therefore going to use Bactroban under silver alginate On the left plantar first second toe web space. Debridement is required here. This is also unchanged 11/17/17; left lateral leg wound continues to contract using Hydrofera Blue this is no longer the major issue. The major concern here is the right first second toe web space. He now has an open area going from dorsally to the plantar aspect. There is now wound on the inner lateral part of the first toe. Not [name] very viable surface on this. There is erythema spreading medially into the forefoot. No major change in the left first second toe plantar wound 11/24/17; left lateral leg wound continues to contract using Hydrofera Blue. Nice improvement today The right first second toe web space all of this looks [name] lot less angry than last week. I have given him clindamycin and topical Bactroban for MRSA and terbinafine for the possibility of underlining tinea pedis that I  could not control with ketoconazole. Looks somewhat better The area on the plantar left first second toe web space is weeping with dried debris around the wound 12/01/17; left lateral leg wound continues to contract he Hydrofera Blue. It is becoming thinner in terms of with nevertheless it is making good improvement. The right first second toe web space looks less angry but still [name] large necrotic-looking wounds starting on the plantar aspect of the right foot extending between the toes and now extensively on the base of the right second toe. I gave him clindamycin and topical Bactroban for MRSA anterior benefiting for the possibility of underlying tinea pedis. Not  looking better today The area on the left first/second toe looks better. Debrided of necrotic debris 12/05/17* the patient was worked in urgently today because over the weekend he found blood on his incontinence bad when he woke up. He was found to have an ulcer by his wife who does most of his wound care. He came in today for [place] to look at this. He has not had [name] history of wounds in his buttocks in spite of his paraplegia. 12/08/17; seen in follow-up today at his usual appointment. He was seen earlier this week and found to have [name] new wound on his buttock. We also follow him for wounds on the left lateral leg, left first second toe web space and right first second toe web space 12/15/17; we have been using Hydrofera Blue to the left lateral leg which has improved. The right first second toe web space has also improved. Left first second toe web space plantar aspect looks stable. The left buttock has worsened using Santyl. Apparently the buttock has drainage 12/22/17; we have been using Hydrofera Blue to the left lateral leg which continues to improve now 2 small wounds separated by normal skin. He tells [place] he had [name] fever up to 100 yesterday he is prone to UTIs but has not noted anything different. He does in and out catheterizations. The area between the first and second toes today does not look good necrotic surface covered with what looks to be purulent drainage and erythema extending into the third toe. I had gotten this to something that I thought look better last time however it is not look good today. He also has [name] necrotic surface over the buttock wound which is expanded. I thought there might be infection under here so I removed [name] lot of the surface with [name] #5 curet though nothing look like it really needed culturing. He is been using Santyl to this area 12/27/17; his original wound on the left lateral leg continues to improve using Hydrofera Blue. I gave him samples of Baxdella although he was  unable to take them out of fear for an allergic reaction ["lump in his throat"].the culture I did of the purulent drainage from his second toe last week showed both enterococcus and [name] set Enterobacter I was also concerned about the erythema on the bottom of his foot although paradoxically although this looks somewhat better today. 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 We've been using silver alginate here. They came up today that he sleeps in [name] recliner, I'm not sure why but I asked him to stop this 01/03/18; his original wound we've been using Hydrofera Blue is now separated into 2 areas. Ulcer on his left buttock is better he is off the recliner and sleeping  in bed Finally both wound areas between his first and second toes also looks some better 01/10/18; his original wound on the left lateral leg is now separated into 2 wounds we've been using Hydrofera Blue Ulcer on his left buttock has some drainage. There is [name] small probing site going into muscle layer superiorly.using silver alginate -He arrives today with [name] deep tissue injury on the left heel The wound on the dorsal aspect of his first second toe on the left looks [name] lot betterusing silver alginate ketoconazole The area on the first second toe web space on the right also looks [name] lot bette 01/17/18; his original wound on the left lateral leg continues to progress using Hydrofera Blue Ulcer on his left buttock also is smaller surface healthier except for [name] small probing site going into the muscle layer superiorly. 2.4 cm of tunneling in this area DTI on his left heel we have only been offloading. Looks better than last week no threatened open no evidence of infection the wound on the dorsal aspect of the first second toe on the left continues to look like it's regressing we have only been using silver alginate and terbinafine orally The area in the  first second toe web space on the right also looks to be [name] lot better using silver alginate and terbinafine I think this was prompted by tinea pedis 01/31/18; the patient was hospitalized in [place] last week apparently for [name] complicated UTI. He was discharged on cefepime he does in and out catheterizations. In the hospital he was discovered M I don't mild elevation of AST and ALT and the terbinafine was stopped.predictably the pressure ulcer on s his buttock looks betterusing silver alginate. The area on the left lateral leg also is better using Hydrofera Blue. The area between the first and second toes on the left better. First and second toes on the right still substantial but better. Finally the DTI on the left heel has held together and looks like it's resolving 02/07/18-he is here in follow-up evaluation for multiple ulcerations. He has new injury to the lateral aspect of the last issue [name] pressure ulcer, he states this is from adhesive removal trauma. He states he has tried multiple adhesive products with no success. All other ulcers appear stable. The left heel DTI is resolving. We will continue with same treatment plan and follow-up next week. 02/14/18; follow-up for multiple areas. He has [name] new area last week on the lateral aspect of his pressure ulcer more over the posterior trochanter. The original pressure ulcer looks quite stable has healthy granulation. 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 Almost fully epithelialized on the original superior area using Hydrofera Blue DTI on the left heel has peeled off this week to reveal [name] small superficial wound under denuded skin and subcutaneous tissue Both areas between the first and second toes look better including nothing open on the left 02/21/18; The patient's wounds on his left ischial tuberosity and posterior left greater trochanter  actually looked better. He has [name] large area of irritation around the area which I think is contact dermatitis. I am doubtful that this is fungal His original wound on the left lateral calf continues to improve we have been using Hydrofera Blue There is no open area in the left first second toe web space although there is [name] lot of thick callus The DTI on the left heel  required debridement today of necrotic surface eschar and subcutaneous tissue using silver alginate Finally the area on the right first second toe webspace continues to contract using silver alginate and ketoconazole 02/28/18 Left ischial tuberosity wounds look better using silver alginate. Original wound on the left calf only has one small open area left using Hydrofera Blue DTI on the left heel required debridement mostly removing skin from around this wound surface. Using silver alginate The areas on the right first/second toe web space using silver alginate and ketoconazole 03/08/18 on evaluation today patient appears to be doing decently well as best I can tell in regard to his wounds. This is the first time that I have seen him as he generally is followed by Dr. Dellia Nims. With that being said none of his wounds appear to be infected he does have an area where there is some skin covering what appears to be [name] new wound on the left dorsal surface of his great toe. This is right at the nail bed. With that being said I do believe that debrided away some of the excess skin can be of benefit in this regard. Otherwise he has been tolerating the dressing changes without complication. 03/14/18; patient arrives today with the multiplicity of wounds that we are following. He has not been systemically unwell Original wound on the left lateral calf now only has 2 small open areas we've been using Hydrofera Blue which should continue The deep tissue injury on the left heel requires debridement today. We've been using silver alginate The left first  second toe and the right first second toe are both are reminiscence what I think was tinea pedis. Apparently some of the callus Surface between the toes was removed last week when it started draining. Purulent drainage coming from the wound on the ischial tuberosity on the left. 03/21/18-He is here in follow-up evaluation for multiple wounds. 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 He tolerated debridement. The only change to last week's recommendations is to discontinue antifungal cream between toes. He will follow-up next week 03/28/18; following up for multiple wounds;Concern this week is streaking redness and swelling in the right foot. He is going to need antibiotics for this. 03/31/18; follow-up for right foot cellulitis. Streaking redness and swelling in the right foot on 03/28/18. 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 I put him on clindamycin 300 mg every 6 and brought him in for [name] quick check. He has an open wound between his first and second toes on the right foot as [name] potential source. 04/04/18; Right foot cellulitis is resolving he is completing clindamycin. This is truly good news Left lateral calf wound which is initial wound only has one small open area inferiorly this is close to healing out. He has compression stockings. We will use Hydrofera Blue right down to the epithelialization of this Nonviable surface on the left heel which was initially pressure with [name] DTI. We've been using Hydrofera Blue. I'm going to switch this back to silver alginate Left first second toe/tinea pedis this looks better using silver alginate Right first second toe tinea pedis using silver alginate Large pressure ulcers on theLeft ischial tuberosity. Small wound here Looks better. I am uncertain about the surface over the large wound. Using silver alginate 04/11/18; Cellulitis in the right foot is  resolved Left lateral calf wound which was his original wounds still has 2 tiny open areas remaining this is  just about closed Nonviable surface on the left heel is better but still requires debridement Left first second toe/tinea pedis still open using silver alginate Right first second toe wound tinea pedis I asked him to go back to using ketoconazole and silver alginate Large pressure ulcers on the left ischial tuberosity this shear injury here is resolved. Wound is smaller. No evidence of infection using silver alginate 04/18/18; Patient arrives with an intense area of cellulitis in the right mid lower calf extending into the right heel area. Bright red and warm. Smaller area on the left anterior leg. He has [name] significant history of MRSA. He will definitely need antibioticsdoxycycline He now has 2 open areas on the left ischial tuberosity the original large wound and now [name] satellite area which I think was above his initial satellite areas. Not [name] wonderful surface on this satellite area surrounding erythema which looks like pressure related. His left lateral calf wound again his original wound is just about closed Left heel pressure injury still requiring debridement Left first second toe looks [name] lot better using silver alginate Right first second toe also using silver alginate and ketoconazole cream also looks better 04/20/18; the patient was worked in early today out of concerns with his cellulitis on the right leg. I had started him on doxycycline. This was 2 days ago. His wife was concerned about the swelling in the area. Also concerned about the left buttock. He has not been systemically unwell no fever chills. No nausea vomiting or diarrhea 04/25/18; the patient's left buttock wound is continued to deteriorate he is using Hydrofera Blue. He is still completing clindamycin for the cellulitis on the right leg although all of this looks better. 05/02/18 Left buttock wound still with [name] lot of  drainage and [name] very tightly adherent fibrinous necrotic surface. He has [name] deeper area superiorly The left lateral calf wound is still closed DTI wound on the left heel necrotic surface especially the circumference using Iodoflex Areas between his left first second toe and right first second toe both look better. Dorsally and the right first second toe he had [name] necrotic surface although at smaller. In using silver alginate and ketoconazole. I did [name] culture last week which was [name] deep tissue culture of the reminiscence of the open wound on the right first second toe dorsally. This grew [name] few Acinetobacter and [name] few methicillin-resistant staph aureus. Nevertheless the area actually this week looked better. I didn't feel the need to specifically address this at least in terms of systemic antibiotics. 05/09/18; wounds are measuring larger more drainage per our intake. We are using Santyl covered with alginate on the large superficial buttock wounds, Iodosorb on the left heel, ketoconazole and silver alginate to the dorsal first and second toes bilaterally. 05/16/18; The area on his left buttock better in some aspects although the area superiorly over the ischial tuberosity required an extensive debridement.using Santyl Left heel appears stable. Using Iodoflex The areas between his first and second toes are not bad however there is spreading erythema up the dorsal aspect of his left foot this looks like cellulitis again. He is insensate the erythema is really very brilliant.o Erysipelas He went to see an allergist days ago because he was itching part of this he had lab work done. This showed [name] white count of 15.1 with 70% neutrophils. Hemoglobin of 11.4 and [name] platelet count of 659,000. Last white count we had in Epic was [name] 2-1/2 years ago which was 25.9 but  he was ill at the time. He was able to show me some lab work that was done by his primary physician the pattern is about the same. 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 But prompted me to go ahead and do x-rays of both feet and the pelvis rule out osteomyelitis. He also had [name] comprehensive metabolic panel this was reasonably normal his albumin was 3.7 liver function tests BUN/creatinine all normal 05/23/18; x-rays of both his feet from last week were negative for underlying pulmonary abnormality. The x-ray of his pelvis however showed mild irregularity in the left ischial which may represent some early osteomyelitis. The wound in the left ischial continues to get deeper clearly now exposed muscle. Each week necrotic surface material over this area. Whereas the rest of the wounds do not look so bad. The left ischial wound we have been using Santyl and calcium alginate T the left heel surface necrotic debris using Iodoflex o The left lateral leg is still healed Areas on the left dorsal foot and the right dorsal foot are about the same. There is some inflammation on the left which might represent contact dermatitis, fungal dermatitis I am doubtful cellulitis although this looks better than last week 05/30/18; CT scan done at Hospital did not show any osteomyelitis or abscess. Suggested the possibility of underlying cellulitis although I don't see [name] lot of evidence of this at the bedside The wound itself on the left buttock/upper thigh actually looks somewhat better. No debridement Left heel also looks better no debridement continue Iodoflex Both dorsal first second toe spaces appear better using Lotrisone. Left still required debridement 06/06/18; Intake reported some purulent looking drainage from the left gluteal wound. Using Santyl and calcium alginate Left heel looks better although still [name] nonviable surface requiring debridement The left dorsal foot first/second webspace actually expanding and somewhat deeper. I may consider doing [name] shave biopsy of this area Right dorsal foot first/second  webspace appears stable to improved. Using Lotrisone and silver alginate to both these areas 06/13/18 Left gluteal surface looks better. Now separated in the 2 wounds. No debridement required. Still drainage. We'll continue silver alginate Left heel continues to look better with Iodoflex continue this for at least another week Of his dorsal foot wounds the area on the left still has some depth although it looks better than last week. We've been using Lotrisone and silver alginate 06/20/18 Left gluteal continues to look better healthy tissue Left heel continues to look better healthy granulation wound is smaller. He is using Iodoflex and his long as this continues continue the Iodoflex Dorsal right foot looks better unfortunately dorsal left foot does not. There is swelling and erythema of his forefoot. He had minor trauma to this several days ago but doesn't think this was enough to have caused any tissue injury. Foot looks like cellulitis, we have had this problem before 06/27/18 on evaluation today patient appears to be doing [name] little worse in regard to his foot ulcer. 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 Both of which would really be his only options for treating this infection. In the past he has been given and effusion of Orbactiv. This is done very well for him in the past again it's one time dosing IV antibiotic therapy. Subsequently I do believe this  is something we're gonna need to see about doing at this point in time. Currently his other wounds seem to be doing somewhat better in my pinion I'm pretty happy in that regard. 07/03/18 on evaluation today patient's wounds actually appear to be doing fairly well. He has been tolerating the dressing changes without complication. All in all he seems to be showing signs of improvement. In regard to the  antibiotics he has been dealing with infectious disease since I saw him last week as far as getting this scheduled. In the end he's [facility] this coming Friday. In the meantime he has been continuing to perform the dressing changes in such as previous. There does not appear to be any evidence of infection worsengin at this time. 07/10/18; Since I last saw this man 2 weeks ago things have actually improved. IV antibiotics of resulted in less forefoot erythema although there is still some present. He is not systemically unwell Left buttock wounds 2 now have no depth there is increased epithelialization Using silver alginate Left heel still requires debridement using Iodoflex Left dorsal foot still with [name] sizable wound about the size of [name] border but healthy granulation Right dorsal foot still with [name] slitlike area using silver alginate 07/18/18; the patient's cellulitis in the left foot is improved in fact I think it is on its way to resolving. Left buttock wounds 2 both look better although the larger one has hypertension granulation we've been using silver alginate Left heel has some thick circumferential redundant skin over the wound edge which will need to be removed today we've been using Iodoflex Left dorsal foot is still [name] sizable wound required debridement using silver alginate The right dorsal foot is just about closed only [name] small open area remains here 07/25/18; left foot cellulitis is resolved Left buttock wounds 2 both look better. Hyper-granulation on the major area Left heel as some debris over the surface but otherwise looks [name] healthier wound. Using silver collagen Right dorsal foot is just about closed 07/31/18; arrives with our intake nurse worried about purulent drainage from the buttock. We had hyper-granulation here last week His buttock wounds 2 continue to look better Left heel some debris over the surface but measuring  smaller. Right dorsal foot unfortunately has openings between the toes Left foot superficial wound looks less aggravated. 08/07/18 Buttock wounds continue to look better although some of her granulation and the larger medial wound. silver alginate Left heel continues to look [name] lot better.silver collagen Left foot superficial wound looks less stable. Requires debridement. He has [name] new wound superficial area on the foot on the lateral dorsal foot. Right foot looks better using silver alginate without Lotrisone 08/14/2018; patient was in the ER last week diagnosed with [name] UTI. He is now on Cefpodoxime and Macrodantin. Buttock wounds continued to be smaller. Using silver alginate Left heel continues to look better using silver collagen Left foot superficial wound looks as though it is improving Right dorsal foot area is just about healed. 08/21/2018; patient is completed his antibiotics for his UTI. He has 2 open areas on the buttocks. There is still not closed although the surface looks satisfactory. Using silver alginate Left heel continues to improve using silver collagen The bilateral dorsal foot areas which are at the base of his first and second toes/possible tinea pedis are actually stable on the left but worse on the right. The area on the left  required debridement of necrotic surface. After debridement I obtained [name] specimen for PCR culture. The right dorsal foot which is been just about healed last week is now reopened 08/28/2018; culture done on the left dorsal foot showed coag negative staph both staph epidermidis and Lugdunensis. I think this is worthwhile initiating systemic treatment. I will use doxycycline given his long list of allergies. The area on the left heel slightly improved but still requiring debridement. The large wound on the buttock is just about closed whereas the smaller one is larger. Using silver alginate in this area 09/04/2018; patient is completing his doxycycline for  the left foot although this continues to be [name] very difficult wound area with very adherent necrotic debris. We are using silver alginate to all his wounds right foot left foot and the small wounds on his buttock, silver collagen on the left heel. 09/11/2018; once again this patient has intense erythema and swelling of the left forefoot. Lesser degrees of erythema in the right foot. He has [name] long list of allergies and intolerances. I will reinstitute doxycycline. 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 Using silver alginate Left heel also looks better using silver collagen Unfortunately both the areas on his feet look worse. The area on the left first second webspace is now gone through to the plantar part of his foot. The area on the left foot anteriorly is irritated with erythema and swelling in the forefoot. 09/25/2018 His wound on the left plantar heel looks better. Using silver collagen The area on the left buttock 2 small remnant areas. One is closed one is still open. Using silver alginate The areas between both his first and second toes look worse. This in spite of long-standing antifungal therapy with ketoconazole and silver alginate which should have antifungal activity He has small areas around his original wound on the left calf one is on the bottom of the original scar tissue and one superiorly both of these are small and superficial but again given wound history in this site this is worrisome 10/02/2018 Left plantar heel continues to gradually contract using silver collagen Left buttock wound is unchanged using silver alginate The areas on his dorsal feet between his first and second toes bilaterally look about the same. I prescribed clindamycin ointment to see if we can address chronic staph colonization and also the underlying possibility of erythrasma The left lateral lower extremity wound is actually on the lateral part of his  ankle. Small open area here. We have been using silver alginate 10/09/2018; Left plantar heel continues to look healthy and contract. No debridement is required Left buttock slightly smaller with [name] tape injury wound just below which was new this week Dorsal feet somewhat improved I have been using clindamycin Left lateral looks lower extremity the actual open area looks worse although [name] lot of this is epithelialized. I am going to change to silver collagen today He has [name] lot more swelling in the right leg although this is not pitting not red and not particularly warm there is [name] lot of spasm in the right leg usually indicative of people with paralysis of some underlying discomfort. We have reviewed his vascular status from 2017 he had [name] left greater saphenous vein ablation. I wonder about referring him back to vascular surgery if the area on the left leg continues to deteriorate. 10/16/2018 in today for follow-up and management of multiple lower extremity ulcers. His left  Buttock wound is much lower smaller and almost closed completely. The wound to the left ankle has began to reopen with Epithelialization and some adherent slough. He has multiple new areas to the left foot and leg. The left dorsal foot without much improvement. Wound present between left great webspace and 2nd toe. Erythema and edema present right leg. Right LE ultrasound obtained on 10/10/18 was negative for DVT . 10/23/2018; Left buttock is closed over. Still dry macerated skin but there is no open wound. I suspect this is chronic pressure/moisture Left lateral calf is quite [name] bit worse than when I saw this last. There is clearly drainage here he has macerated skin into the left plantar heel. We will change the primary dressing to alginate Left dorsal foot has some improvement in overall wound area. Still using clindamycin and silver alginate Right dorsal foot about the same as the left using clindamycin and silver alginate The  erythema in the right leg has resolved. He is DVT rule out was negative Left heel pressure area required debridement although the wound is smaller and the surface is health 10/26/2018 The patient came back in for his nurse check today predominantly because of the drainage coming out of the left lateral leg with [name] recent reopening of his original wound on the left lateral calf. He comes in today with [name] large amount of surrounding erythema around the wound extending from the calf into the ankle and even in the area on the dorsal foot. He is not systemically unwell. He is not febrile. Nevertheless this looks like cellulitis. We have been using silver alginate to the area. I changed him to [name] regular visit and I am going to prescribe him doxycycline. The rationale here is [name] long list of medication intolerances and [name] history of MRSA. I did not see anything that I thought would provide [name] valuable culture 10/30/2018 Follow-up from his appointment 4 days ago with really an extensive area of cellulitis in the left calf left lateral ankle and left dorsal foot. I put him on doxycycline. He has [name] long list of medication allergies which are true allergy reactions. 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 In any case he is [name] lot better today. The erythema especially in the anterior and lateral left calf is better. He still has left ankle erythema. He also is complaining about increasing edema in the right leg we have only been using Kerlix Coban and he has been doing the wraps at home. Finally he has [name] spotty rash on the medial part of his upper left calf which looks like folliculitis or perhaps wrap occlusion type injury. Small superficial macules not pustules 11/06/18 patient arrives today with again [name] considerable degree of erythema around the wound on the left lateral calf extending into the dorsal ankle and dorsal foot. This is [name] lot worse  than when I saw this last week. He is on doxycycline really with not [name] lot of improvement. He has not been systemically unwell Wounds on the; left heel actually looks improved. Original area on the left foot and proximity to the first and second toes looks about the same. He has superficial areas on the dorsal foot, anterior calf and then the reopening of his original wound on the left lateral calf which looks about the same The only area he has on the right is the dorsal webspace first and second which is smaller. He has [name]  large area of dry erythematous skin on the left buttock small open area here. 11/13/2018; the patient arrives in much better condition. The erythema around the wound on the left lateral calf is [name] lot better. Not sure whether this was the clindamycin or the TCA and ketoconazole or just in the improvement in edema control [stasis dermatitis]. In any case this is [name] lot better. The area on the left heel is very small and just about resolved using silver collagen we have been using silver alginate to the areas on his dorsal feet 11/20/2018; his wounds include the left lateral calf, left heel, dorsal aspects of both feet just proximal to the first second webspace. He is stable to slightly improved. I did not think any changes to his dressings were going to be necessary 11/27/2018 he has [name] reopening on the left buttock which is surrounded by what looks like tinea or perhaps some other form of dermatitis. The area on the left dorsal foot has some erythema around it I have marked this area but I am not sure whether this is cellulitis or not. Left heel is not closed. Left calf the reopening is really slightly longer and probably worse 1/13; in general things look better and smaller except for the left dorsal foot. Area on the left heel is just about closed, left buttock looks better only [name] small wound remains in the skin looks better [using Lotrisone] 1/20; the area on the left heel only has [name]  few remaining open areas here. Left lateral calf about the same in terms of size, left dorsal foot slightly larger right lateral foot still not closed. The area on the left buttock has no open wound and the surrounding skin looks [name] lot better 1/27; the area on the left heel is closed. Left lateral calf better but still requiring extensive debridements. The area on his left buttock is closed. He still has the open areas on the left dorsal foot which is slightly smaller in the right foot which is slightly expanded. We have been using Iodoflex on these areas as well 2/3; left heel is closed. Left lateral calf still requiring debridement using Iodoflex there is no open area on his left buttock however he has dry scaly skin over [name] large area of this. Not really responding well to the Lotrisone. Finally the areas on his dorsal feet at the level of the first second webspace are slightly smaller on the right and about the same on the left. Both of these vigorously debrided with Anasept and gauze 2/10; left heel remains closed he has dry erythematous skin over the left buttock but there is no open wound here. Left lateral leg has come in and with. Still requiring debridement we have been using Iodoflex here. Finally the area on the left dorsal foot and right dorsal foot are really about the same extremely dry callused fissured areas. He does not yet have [name] dermatology appointment 2/17; left heel remains closed. He has [name] new open area on the left buttock. The area on the left lateral calf is bigger longer and still covered in necrotic debris. No major change in his foot areas bilaterally. I am awaiting for [name] dermatologist to look on this. We have been using ketoconazole I do not know that this is been doing any good at all. 2/24; left heel remains closed. The left buttock wound that was new reopening last week looks better. The left lateral calf appears better also although still requires debridement. The  major  area on his foot is the left first second also requiring debridement. We have been putting Prisma on all wounds. I do not believe that the ketoconazole has done too much good for his feet. He will use Lotrisone I am going to give him [name] 2-week course of terbinafine. We still do not have [name] dermatology appointment 3/2 left heel remains closed however there is skin over bone in this area I pointed this out to him today. The left buttock wound is epithelialized but still does not look completely stable. The area on the left leg required debridement were using silver collagen here. With regards to his feet we changed to Lotrisone last week and silver alginate. 3/9; left heel remains closed. Left buttock remains closed. The area on the right foot is essentially closed. The left foot remains unchanged. Slightly smaller on the left lateral calf. Using silver collagen to both of these areas 3/16-Left heel remains closed. Area on right foot is closed. Left lateral calf above the lateral malleolus open wound requiring debridement with easy bleeding. Left dorsal wound proximal to first toe also debrided. Left ischial area open new. Patient has been using Prisma with wrapping every 3 days. Dermatology appointment is apparently tomorrow.Patient has completed his terbinafine 2-week course with some apparent improvement according to him, there is still flaking and dry skin in his foot on the left 3/23; area on the right foot is reopened. The area on the left anterior foot is about the same still [name] very necrotic adherent surface. He still has the area on the left leg and reopening is on the left buttock. He apparently saw dermatology although I do not have [name] note. According to the patient who is usually fairly well informed they did not have any good ideas. Put him on oral terbinafine which she is been on before. 3/30; using silver collagen to all wounds. Apparently his dermatologist put him on doxycycline and  rifampin presumably some culture grew staph. I do not have this result. He remains on terbinafine although I have used terbinafine on him before 4/6; patient has had [name] fairly substantial reopening on the right foot between the first and second toes. He is finished his terbinafine and I believe is on doxycycline and rifampin still as prescribed by dermatology. We have been using silver collagen to all his wounds although the patient reports that he thinks silver alginate does better on the wounds on his buttock. 4/13; the area on his left lateral calf about the same size but it did not require debridement. Left dorsal foot just proximal to the webspace between the first and second toes is about the same. Still nonviable surface. I note some superficial bronze discoloration of the dorsal part of his foot Right dorsal foot just proximal to the first and second toes also looks about the same. I still think there may be the same discoloration I noted above on the left Left buttock wound looks about the same 4/20; left lateral calf appears to be gradually contracting using silver collagen. 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 The left dorsal foot wound is debrided of tightly adherent necrotic debris and really cleans up quite nicely. The right area is worse with expansion. I did not debride this it is now over the base of the second toe The area on his left buttock is smaller no debridement is required using silver collagen 5/4; left calf continues to make good progress. He  arrives with erythema around the wounds on his dorsal foot which even extends to the plantar aspect. Very concerning for coexistent infection. He is finished the erythromycin I gave him for possible erythrasma this does not seem to have helped. The area on the left foot is about the same base of the dorsal toes Is area on the buttock looks improved on the left 5/11; left  calf and left buttock continued to make good progress. Left foot is about the same to slightly improved. Major problem is on the right foot. He has not had an x-ray. Deep tissue culture I did last week showed both Enterobacter and E. coli. I did not change the doxycycline I put him on empirically although neither 1 of these were plated to doxycycline. He arrives today with the erythema looking worse on both the dorsal and plantar foot. Macerated skin on the bottom of the foot. he has not been systemically unwell 5/18-Patient returns at 1 week, left calf wound appears to be making some progress, left buttock wound appears slightly worse than last time, left foot wound looks slightly better, right foot redness is marginally better. X-ray of both feet show no air or evidence of osteomyelitis. Patient is finished his Omnicef and terbinafine. He continues to have macerated skin on the bottom of the left foot as well as right 5/26; left calf wound is better, left buttock wound appears to have multiple small superficial open areas with surrounding macerated skin. X-rays that I did last time showed no evidence of osteomyelitis in either foot. He is finished cefdinir and doxycycline. I do not think that he was on terbinafine. He continues to have [name] large superficial open area on the right foot anterior dorsal and slightly between the first and second toes. I did send him to dermatology 2 months ago or so wondering about whether they would do [name] fungal scraping. I do not believe they did but did do [name] culture. We have been using silver alginate to the toe areas, he has been using antifungals at home topically either ketoconazole or Lotrisone. We are using silver collagen on the left foot, silver alginate on the right, silver collagen on the left lateral leg and silver alginate on the left buttock 6/1; left buttock area is healed. We have the left dorsal foot, left lateral leg and right dorsal foot. We are using  silver alginate to the areas on both feet and silver collagen to the area on his left lateral calf 6/8; the left buttock apparently reopened late last week. He is not really sure how this happened. He is tolerating the terbinafine. Using silver alginate to all wounds 6/15; left buttock wound is larger than last week but still superficial. Came in the clinic today with [name] report of purulence from the left lateral leg I did not identify any infection Both areas on his dorsal feet appear to be better. He is tolerating the terbinafine. Using silver alginate to all wounds 6/22; left buttock is about the same this week, left calf quite [name] bit better. His left foot is about the same however he comes in with erythema and warmth in the right forefoot once again. Culture that I gave him in the beginning of May showed Enterobacter and E. coli. I gave him doxycycline and things seem to improve although neither 1 of these organisms was specifically plated. 6/29; left buttock is larger and dry this week. Left lateral calf looks to me to be improved. Left dorsal foot also  somewhat improved right foot completely unchanged. The erythema on the right foot is still present. He is completing the Ceftin dinner that I gave him empirically [see discussion above.) 7/6 - All wounds look to be stable and perhaps improved, the left buttock wound is slightly smaller, per patient bleeds easily, completed ceftin, the right foot redness is less, he is on terbinafine 7/13; left buttock wound about the same perhaps slightly narrower. Area on the left lateral leg continues to narrow. Left dorsal foot slightly smaller right foot about the same. We are using silver alginate on the right foot and Hydrofera Blue to the areas on the left. Unna boot on the left 2 layer compression on the right 7/20; left buttock wound absolutely the same. Area on lateral leg continues to get better. Left dorsal foot require debridement as did the right no  major change in the 7/27; left buttock wound the same size necrotic debris over the surface. The area on the lateral leg is closed once again. His left foot looks better right foot about the same although there is some involvement now of the posterior first second toe area. He is still on terbinafine which I have given him for [name] month, not certain [name] centimeter major change 06/25/19-All wounds appear to be slightly improved according to report, left buttock wound looks clean, both foot wounds have minimal to no debris the right dorsal foot has minimal slough. We are using Hydrofera Blue to the left and silver alginate to the right foot and ischial wound. 8/10-Wounds all appear to be around the same, the right forefoot distal part has some redness which was not there before, however the wound looks clean and small. Ischial wound looks about the same with no changes 8/17; his wound on the left lateral calf which was his original chronic venous insufficiency wound remains closed. Since I last saw him the areas on the left dorsal foot right dorsal foot generally appear better but require debridement. The area on his left initial tuberosity appears somewhat larger to me perhaps hyper granulated and bleeds very easily. We have been using Hydrofera Blue to the left dorsal foot and silver alginate to everything else 8/24; left lateral calf remains closed. The areas on his dorsal feet on the webspace of the first and second toes bilaterally both look better. The area on the left buttock which is the pressure ulcer stage II slightly smaller. I change the dressing to Hydrofera Blue to all areas 8/31; left lateral calf remains closed. The area on his dorsal feet bilaterally look better. Using Hydrofera Blue. Still requiring debridement on the left foot. No change in the left buttock pressure ulcers however 9/14; left lateral calf remains closed. Dorsal feet look quite [name] bit better than 2 weeks ago. Flaking dry skin  also [name] lot better with the ammonium lactate I gave him 2 weeks ago. The area on the left buttock is improved. He states that his Roho cushion developed [name] leak and he is getting [name] new one, in the interim he is offloading this vigorously 9/21; left calf remains closed. Left heel which was [name] possible DTI looks better this week. He had macerated tissue around the left dorsal foot right foot looks satisfactory and improved left buttock wound. I changed his dressings to his feet to silver alginate bilaterally. Continuing Hydrofera Blue on the left buttock. 9/28 left calf remains closed. Left heel did not develop anything [possible DTI] dry flaking skin on the left dorsal foot. Right  foot looks satisfactory. Improved left buttock wound. We are using silver alginate on his feet Hydrofera Blue on the buttock. I have asked him to go back to the Lotrisone on his feet including the wounds and surrounding areas 10/5; left calf remains closed. The areas on the left and right feet about the same. [name] lot of this is epithelialized however debris over the remaining open areas. He is using Lotrisone and silver alginate. The area on the left buttock using Hydrofera Blue 10/26. Patient has been out for 3 weeks secondary to Covid concerns. He tested negative but I think his wife tested positive. He comes in today with the left foot substantially worse, right foot about the same. Even more concerning he states that the area on his left buttock closed over but then reopened and is considerably deeper in one aspect than it was before [stage III wound] 11/2; left foot really about the same as last week. Quarter sized wound on the dorsal foot just proximal to the first second toes. Surrounding erythema with areas of denuded epithelium. This is not really much different looking. Did not look like cellulitis this time however. Right foot area about the same.. We have been using silver alginate alginate on his toes Left buttock  still substantial irritated skin around the wound which I think looks somewhat better. We have been using Hydrofera Blue here. 11/9; left foot larger than last week and [name] very necrotic surface. Right foot I think is about the same perhaps slightly smaller. Debris around the circumference also addressed. Unfortunately on the left buttock there is been [name] decline. Satellite lesions below the major wound distally and now [name] an additional one posteriorly we have been using Hydrofera Blue but I think this is [name] pressure issue 11/16; left foot ulcer dorsally again [name] very adherent necrotic surface. Right foot is about the same. Not much change in the pressure ulcer on his left buttock. 11/30; left foot ulcer dorsally basically the same as when I saw him 2 weeks ago. Very adherent fibrinous debris on the wound surface. Patient reports [name] lot of drainage as well. 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 We have been using Iodoflex, patient changed back to alginate because of the drainage. Area on his right dorsal foot really looks benign with [name] healthier surface certainly [name] lot better than on the left. Left buttock wounds all improved using Hydrofera Blue 12/7; left dorsal foot again no improvement. Tightly adherent debris. PCR culture I did last week only showed likely skin contaminant. I have gone ahead and done [name] punch biopsy of this which is about the last thing in terms of investigations I can think to do. He has known venous insufficiency and venous hypertension and this could be the issue here. The area on the right foot is about the same left buttock slightly worse according to our intake nurse secondary to [facility] Blue sticking to the wound 12/14; biopsy of the left foot that I did last time showed changes that could be related to wound healing/chronic stasis dermatitis phenomenon no neoplasm. We have been using silver alginate to both feet. I change the  one on the left today to Sorbact and silver alginate to his other 2 wounds 12/28; the patient arrives with the following problems; Major issue is the dorsal left foot which continues to be [name] larger deeper wound area. Still with [name] completely nonviable surface Paradoxically the area mirror image on the  right on the right dorsal foot appears to be getting better. He had some loss of dry denuded skin from the lower part of his original wound on the left lateral calf. Some of this area looked [name] little vulnerable and for this reason we put him in wrap that on this side this week The area on his left buttock is larger. He still has the erythematous circular area which I think is [name] combination of pressure, sweat. This does not look like cellulitis or fungal dermatitis 11/26/2019; -Dorsal left foot large open wound with depth. Still debris over the surface. Using Sorbact The area on the dorsal right foot paradoxically has closed over He has [name] reopening on the left ankle laterally at the base of his original wound that extended up into the calf. This appears clean. The left buttock wound is smaller but with very adherent necrotic debris over the surface. We have been using silver alginate here as well The patient had arterial studies done in 2017. He had biphasic waveforms at the dorsalis pedis and posterior tibial bilaterally. ABI in the left was 1.17. Digit waveforms were dampened. He has slight spasticity in the great toes I do not think [name] TBI would be possible 1/11; the patient comes in today with [name] sizable reopening between the first and second toes on the right. This is not exactly in the same location where we have been treating wounds previously. According to our intake nurse this was actually fairly deep but 0.6 cm. The area on the left dorsal foot looks about the same the surface is somewhat cleaner using Sorbact, his MRI is in 2 days. We have not managed yet to get arterial studies. The new  reopening on the left lateral calf looks somewhat better using alginate. The left buttock wound is about the same using alginate 1/18; the patient had his ARTERIAL studies which were quite normal. ABI in the right at 1.13 with triphasic/biphasic waveforms on the left ABI 1.06 again with triphasic/biphasic waveforms. It would not have been possible to have done [name] toe brachial index because of spasticity. We have been using Sorbac to the left foot alginate to the rest of his wounds on the right foot left lateral calf and left buttock 1/25; arrives in clinic with erythema and swelling of the left forefoot worse over the first MTP area. This extends laterally dorsally and but also posteriorly. Still has an area on the left lateral part of the lower part of his calf wound it is eschared and clearly not closed. Area on the left buttock still with surrounding irritation and erythema. Right foot surface wound dorsally. The area between the right and first and second toes appears better. 2/1; The left foot wound is about the same. Erythema slightly better I gave him [name] week of doxycycline empirically Right foot wound is more extensive extending between the toes to the plantar surface Left lateral calf really no open surface on the inferior part of his original wound however the entire area still looks vulnerable Absolutely no improvement in the left buttock wound required debridement. 2/8; the left foot is about the same. Erythema is slightly improved I gave him clindamycin last week. Right foot looks better he is using Lotrimin and silver alginate He has [name] breakdown in the left lateral calf. Denuded epithelium which I have removed Left buttock about the same were using Hydrofera Blue 2/15; left foot is about the same there is less surrounding erythema. Surface still has tightly adherent  debris which I have debriding however not making any progress Right foot has [name] substantial wound on the medial right  second toe between the first and second webspace. Still an open area on the left lateral calf distal area. Buttock wound is about the same 2/22; left foot is about the same less surrounding erythema. Surface has adherent debris. Polymen Ag Right foot area significant wound between the first and second toes. We have been using silver alginate here Left lateral leg polymen Ag at the base of his original venous insufficiency wound Left buttock some improvement here 3/1; Right foot is deteriorating in the first second toe webspace. Larger and more substantial. We have been using silver alginate. Left dorsal foot about the same markedly adherent surface debris using PolyMem Ag Left lateral calf surface debris using PolyMem AG Left buttock is improved again using PolyMem Ag. He is completing his terbinafine. The erythema in the foot seems better. He has been on this for 2 weeks 3/8; no improvement in any wound area in fact he has [name] small open area on the dorsal midfoot which is new this week. He has not gotten his foot x-rays yet 3/15; his x-rays were both negative for osteomyelitis of both feet. No major change in any of his wounds on the extremities however his buttock wounds are better. We have been using polymen on the buttocks, left lower leg. Iodoflex on the left foot and silver alginate on the right 3/22; arrives in clinic today with the 2 major issues are the improvement in the left dorsal foot wound which for once actually looks healthy with [name] nice healthy wound surface without debridement. Using Iodoflex here. Unfortunately on the left lateral calf which is in the distal part of his original wound he came to the clinic here for there was purulent drainage noted some increased breakdown scattered around the original area and [name] small area proximally. We we are using polymen here will change to silver alginate today. His buttock wound on the left is better and I think the area on the right first  second toe webspace is also improved 3/29; left dorsal foot looks better. Using Iodoflex. Left ankle culture from deterioration last time grew E. coli, Enterobacter and Enterococcus. I will give him [name] course of cefdinir although that will not cover Enterococcus. The area on the right foot in the webspace of the first and second toe lateral first toe looks better. The area on his buttock is about healed Vascular appointment is on April 21. This is to look at his venous system vis--vis continued breakdown of the wounds on the left including the left lateral leg and left dorsal foot he. He has had previous ablations on this side 4/5; the area between the right first and second toes lateral aspect of the first toe looks better. Dorsal aspect of the left first toe on the left foot also improved. Unfortunately the left lateral lower leg is larger and there is [name] second satellite wound superiorly. The usual superficial abrasions on the left buttock overall better but certainly not closed 4/12; the area between the right first and second toes is improved. Dorsal aspect of the left foot also slightly smaller with [name] vibrant healthy looking surface. No real change in the left lateral leg and the left buttock wound is healed He has an unaffordable co-pay for Apligraf. Appointment with vein and vascular with regards to the left leg venous part of the circulation is on 4/21 4/19; we continue  to see improvement in all wound areas. Although this is minor. He has his vascular appointment on 4/21. The area on the left buttock has not reopened although right in the center of this area the skin looks somewhat threatened 4/26; the left buttock is unfortunately reopened. In general his left dorsal foot has [name] healthy surface and looks somewhat smaller although it was not measured as such. The area between his first and second toe webspace on the right as [name] small wound against the first toe. The patient saw vascular  surgery. The real question I was asking was about the small saphenous vein on the left. He has previously ablated left greater saphenous vein. Nothing further was commented on on the left. Right greater saphenous vein without reflux at the saphenofemoral junction or proximal thigh there was no indication for ablation of the right greater saphenous vein duplex was negative for DVT bilaterally. They did not think there was anything from [name] point of view that could be offered. They ABIs within normal limits 5/3; only small open area on the left buttock. The area on the left lateral leg which was his original venous reflux is now 2 wounds both which look clean. We are using Iodoflex on the left dorsal foot which looks healthy and smaller. He is down to [name] very tiny area between the right first and second toes, using silver alginate 5/10; all of his wounds appear better. We have much better edema control in 4 layer compression on the left. This may be the factor that is allowing the left foot and left lateral calf to heal. He has external compression garments at home 04/14/20-All of his wounds are progressing well, the left forefoot is practically closed, left ischium appears to be about the same, right toe webspace is also smaller. The left lateral leg is about the same, continue using Hydrofera Blue to this, silver alginate to the ischium, Iodoflex to the toe space on the right 6/7; most of his wounds outside of the left buttock are doing well. The area on the left lateral calf and left dorsal foot are smaller. The area on the right foot in between the first and second toe webspace is barely visible although he still says there is some drainage here is the only reason I did not heal this out. Unfortunately the area on the left buttock almost looks like he has [name] skin tear from tape. He has open wound and then [name] large flap of skin that we are trying to get adherence over an area just next to  the remaining wound 6/21; 2 week follow-up. I believe is been here for nurse visits. Miraculously the area between his first and second toes on the left dorsal foot is closed over. Still open on the right first second web space. The left lateral calf has 2 open areas. Distally this is more superficial. The proximal area had [name] little more depth and required debridement of adherent necrotic material. His buttock wound is actually larger we have been using silver alginate here 6/28; the patient's area on the left foot remains closed. Still open wet area between the first and second toes on the right and also extending into the plantar aspect. We have been using silver alginate in this location. He has 2 areas on the left lower leg part of his original long wounds which I think are better. We have been using Hydrofera Blue here. Hydrofera Blue to the left buttock which is stable  7/12; left foot remains closed. Left ankle is closed. May be [name] small area between his right first and second toes the only truly open area is on the left buttock. We have been using Hydrofera Blue here 7/19; patient arrives with marked deterioration especially in the left foot and ankle. We did not put him in [name] compression wrap on the left last week in fact he wore his juxta lite stockings on either side although he does not have an underlying stocking. He has [name] reopening on the left dorsal foot, left lateral ankle and [name] new area on the right dorsal ankle. More worrisome is the degree of erythema on the left foot extending on the lateral foot into the lateral lower leg on the left 7/26; the patient had erythema and drainage from the lateral left ankle last week. Culture of this grew MRSA resistant to doxycycline and clindamycin which are the 2 antibiotics we usually use with this patient who has multiple antibiotic allergies including linezolid, trimethoprim sulfamethoxazole. I had give him an empiric doxycycline and he comes in  the area certainly looks somewhat better although it is blotchy in his lower leg. He has not been systemically unwell. He has had areas on the left dorsal foot which is [name] reopening, chronic wounds on the left lateral ankle. Both of these I think are secondary to chronic venous insufficiency. The area between his first and second toes is closed as far as I can tell. He had [name] new wrap injury on the right dorsal ankle last week. Finally he has an area on the left buttock. We have been using silver alginate to everything except the left buttock we are using Hydrofera Blue 06/30/20-Patient returns at 1 week, has been given [name] sample dose pack of NUZYRA which is [name] tetracycline derivative [omadacycline], patient has completed those, we have been using silver alginate to almost all the wounds except the left ischium where we are using Hydrofera Blue all of them look better 8/16; since I last saw the patient he has been doing well. The area on the left buttock, left lateral ankle and left foot are all closed today. He has completed the [place] I gave him last time and tolerated this well. He still has open areas on the right dorsal ankle and in the right first second toe area which we are using silver alginate. 8/23; we put him in his bilateral external compression stockings last week as he did not have anything open on either leg except for concerning area between the right first and second toe. He comes in today with an area on the left dorsal foot slightly more proximal than the original wound, the left lateral foot but this is actually [name] continuation of the area he had on the left lateral ankle from last time. As well he is opened up on the left buttock again. 8/30; comes in today with things looking [name] lot better. The area on the left lower ankle has closed down as has the left foot but with eschar in both areas. The area on the dorsal right ankle is also epithelialized. Very little remaining of the left buttock  wound. We have been using silver alginate on all wound areas 9/13; the area in the first second toe webspace on the right has fully epithelialized. He still has some vulnerable epithelium on the right and the ankle and the dorsal foot. He notes weeping. He is using his juxta lite stocking. On the left again the left  dorsal foot is closed left lateral ankle is closed. We went to the juxta lite stocking here as well. Still vulnerable in the left buttock although only 2 small open areas remain here 9/27; 2-week follow-up. We did not look at his left leg but the patient says everything is closed. He is [name] bit disturbed by the amount of edema in his left foot he is using juxta lite stockings but asking about over the toes stockings which would be 30/40, will talk to him next time. According to him there is no open wound on either the left foot or the left ankle/calf He has an open area on the dorsal right calf which I initially point [name] wrap injury. He has superficial remaining wound on the left ischial tuberosity been using silver alginate although he says this sticks to the wound 10/5; we gave him 2-week follow-up but he called yesterday expressing some concerns about his right foot right ankle and the left buttock. He came in early. There is still no open areas on the left leg and that still in his juxta lite stocking 10/11; he only has 1 small area on the left buttock that remains measuring millimeters 1 mm. Still has the same irritated skin in this area. We recommended zinc oxide when this eventually closes and pressure relief is meticulously is he can do this. He still has an area on the dorsal part of his right first through third toes which is [name] bit irritated and still open and on the dorsal ankle near the crease of the ankle. We have been using silver alginate and using his own stocking. He has nothing open on the left leg or foot 10/25; 2-week follow-up. Not nearly as good on the left buttock as I  was hoping. For open areas with 5 looking threatened small. He has the erythematous irritated chronic skin in this area. 1 area on the right dorsal ankle. He reports this area bleeds easily Right dorsal foot just proximal to the base of his toes We have been using silver alginate. 11/8; 2-week follow-up. Left buttock is about the same although I do not think the wounds are in the same location we have been using silver alginate. I have asked him to use zinc oxide on the skin around the wounds. He still has [name] small area on the right dorsal ankle he reports this bleeds easily Right dorsal foot just proximal to the base of the toes does not have anything open although the skin is very dry and scaly He has [name] new opening on the nailbed of the left great toe. Nothing on the left ankle 11/29; 3-week follow-up. Left buttock has 2 open areas. And washing of these wounds today started bleeding easily. Suggesting very friable tissue. We have been using silver alginate. Right dorsal ankle which I thought was initially [name] wrap injury we have been using silver alginate. Nothing open between the toes that I can see. He states the area on the left dorsal toe nailbed healed after the last visit in 2 or 3 days 12/13; 3-week follow-up. His left buttock now has 3 open areas but the original 2 areas are smaller using polymen here. Surrounding skin looks better. The right dorsal ankle is closed. He has [name] small opening on the right dorsal foot at the level of the third toe. In general the skin looks better here. He is wearing his juxta lite stocking on the left leg says there is nothing open 11/24/2020; 3 weeks  follow-up. His left buttock still has the 3 open areas. We have been using polymen but due to lack of response he changed to [facility] area. Surrounding skin is dry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 areas are on the lateral aspect of wrist left lower leg which requires difficult debridement so tightly adherent fibrinous slough and nonviable subcutaneous tissue. On the posterior aspect of his left Achilles heel there is [name] raised area with an ulcer in the middle. The patient and apparently his wife have no history to this. This may need to be biopsied. He has the arterial and venous studies we ordered last week ordered for March 01/16/16; the patient's 2 wounds on his right leg on the anterior leg and Achilles area are both healed. He continues to have [name] deep wound with very adherent necrotic eschar and slough on the lateral aspect of his left leg in 2 areas and also raised area over the left Achilles. We put Santyl on this last week and left him in [name] rapid. He says the drainage went through. He has some Kerlix Coban and in some Profore at home I have therefore written him [name] prescription for Santyl and he can change this at home on his own. 01/23/16; the original 2 wounds on the right leg are apparently still closed. He continues to have [name] deep wound on his left lateral leg in 2 spots the superior one much larger than the inferior one. He also has [name] raised area on the left Achilles. We have been putting Santyl and all of these wounds. His wife is changing this at home one time this week although she may be able to do this more frequently. 01/30/16 no open wounds on the right  leg. He continues to have [name] deep wound on the left lateral leg in 2 spots and [name] smaller wound over the left Achilles area. [name] This debridement is with great difficulty. [name] He also  has some redness around the wound and some swelling but really no warmth 02/05/16; I called the patient back early today to deal with her culture results from last Friday that showed doxycycline resistant MRSA. In spite of that his leg actually looks somewhat better. There is still copious drainage and some erythema but it is generally better. The oral options that were obvious including Zyvox and sulfonamides he has rash issues both of these. This is sensitive to rifampin but this is not usually used along gentamicin but this is parenteral and again not used along. The obvious alternative is vancomycin. He has had his arterial studies. [name] T brachial index was 1.3 oe on the right. His waveforms were biphasic bilaterally. Doppler waveforms of the digit were normal in the right damp and on the left. Comment that this could've been due to extreme edema. His venous studies show reflux on both sides in the femoral popliteal veins as well as the greater and lesser saphenous veins bilaterally. Ultimately he is going to need to see vascular surgery about this issue. Hopefully when we can get his wounds and [name] little better shape. 02/19/16; the patient was able to complete [name] course of [facility]'s for MRSA in the face of multiple antibiotic allergies. Arterial studies showed an ABI of him 0.88 on the right 1.17 on the left the. Waveforms were biphasic at the posterior tibial and dorsalis pedis digital waveforms were normal. Right toe brachial index was 1.3 limited by shaking and edema. His venous study showed widespread reflux in the left at the common femoral  vein the greater and lesser saphenous vein the greater and lesser saphenous vein on the right as well as the popliteal and femoral vein. The popliteal and femoral vein on the left did not show reflux. His wounds on the right leg give healed on the left he is still using Santyl. 02/26/16; patient completed [name] treatment with Dalvance for MRSA in the wound with associated erythema. The erythema has not really resolved and I wonder if this is mostly venous inflammation rather than cellulitis. Still using Santyl. He is approved for Apligraf 03/04/16; there is less erythema around the wound. Both wounds require aggressive surgical debridement. Not yet ready for Apligraf 03/11/16; aggressive debridement again. Not ready for Apligraf 03/18/16 aggressive debridement again. 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 Has been to see vascular surgery he is being planned for [name] venous ablation 03/25/16; aggressive debridement again of both wound areas on the left lateral leg. He is due for ablation surgery on May 22. He is much closer to being ready for an Apligraf. Has [name] new area between the left first and second toes 04/01/16 aggressive debridement done of both wounds. The new wound at the base of between his second and first toes looks stable 04/08/16; continued aggressive debridement of both wounds on the left lower leg. He goes for his venous ablation on Monday. The new wound at the base of his first and second toes dorsally appears stable. 04/15/16; wounds aggressively debridement although the base of this looks considerably better Apligraf #1. He had ablation surgery on Monday I'll need to research these records. We only have approval for four Apligraf's 04/22/16; the patient is here for [name] wound check [Apligraf last week] intake nurse concerned about erythema around the wounds. Apparently [name] significant degree of drainage. The patient has chronic venous inflammation which I think accounts for most of this however  I was asked to look at this today 04/26/16; the patient came back for check of possible cellulitis in his left foot however the Apligraf dressing was inadvertently removed therefore we elected to prep the wound for [name] second Apligraf. I put him on doxycycline on 6/1 the erythema in the foot 05/03/16 we did not remove the dressing from the superior wound as this is where I put all of his last Apligraf. Surface debridement done with [name] curette of the lower wound which looks very healthy. The area on the left foot also looks quite satisfactory at the dorsal artery at the first and second toes 05/10/16; continue Apligraf to this. Her wound, Hydrafera to the lower wound. He has [name] new area on the right second toe. Left dorsal foot firstoosecond toe also looks improved 05/24/16; wound dimensions must be smaller I was able to use Apligraf to all 3 remaining wound areas. 06/07/16 patient's last Apligraf was 2 weeks ago. He arrives today with the 2 wounds on his lateral left leg joined together. This would have to be seen as [name] negative. He also has [name] small wound in his first and second toe on the left dorsally with quite [name] bit of surrounding erythema in the first second and third toes. This looks to be infected or inflamed, very difficult clinical call. 06/21/16: lateral left leg combined wounds. Adherent surface slough area on the left dorsal foot at roughly the fourth toe looks improved 07/12/16; he now has [name] single linear wound on the lateral left leg. This does not look to be [name] lot changed from when I lost saw this. The area on his dorsal left foot looks considerably better however. 08/02/16; no major change in the substantial area on his left lateral leg since last time. We have been using Hydrofera Blue for [name] prolonged period of time now. The area on his left foot is also unchanged from last review 07/19/16; the area on his dorsal foot on the left looks considerably smaller. He is beginning to have significant rims  of epithelialization on the lateral left leg wound. This also looks better. 08/05/16; the patient came in for [name] nurse visit today. Apparently the area on his left lateral leg looks better and it was wrapped. However in general discussion the patient noted [name] new area on the dorsal aspect of his right second toe. The exact etiology of this is unclear but likely relates to pressure. 08/09/16 really the area on the left lateral leg did not really look that healthy today perhaps slightly larger and measurements. The area on his dorsal right second toe is improved also the left foot wound looks stable to improved 08/16/16; the area on the last lateral leg did not change any of dimensions. Post debridement with [name] curet the area looked better. Left foot wound improved and the area on the dorsal right second toe is improved 08/23/16; the area on the left lateral leg may be slightly smaller both in terms of length and width. Aggressive debridement with [name] curette afterwards the tissue appears healthier. Left foot wound appears improved in the area on the dorsal right second toe is improved 08/30/16 patient developed [name] fever over the weekend and was seen in an urgent care. Felt to have [name] UTI and put on doxycycline. He has been since changed over the phone to [facility]. After we took off the wrap on his right leg today the leg is swollen warm and erythematous, probably more likely the source of the  fever 09/06/16; have been using collagen to the major left leg wound, silver alginate to the area on his anterior foot/toes 09/13/16; the areas on his anterior foot/toes on both sides appear to be virtually closed. Extensive wound on the left lateral leg perhaps slightly narrower but each visit still covered an adherent surface slough 09/16/16 patient was in for his usual Thursday nurse visit however the intake nurse noted significant erythema of his dorsal right foot. He is also running [name] low- grade fever and having  increasing spasms in the right leg 09/20/16 here for cellulitis involving his right great toes and forefoot. This is [name] lot better. Still requiring debridement on his left lateral leg. Santyl direct says he needs prior authorization. Therefore his wife cannot change this at home 09/30/16; the patient's extensive area on the left lateral calf and ankle perhaps somewhat better. Using Santyl. The area on the left toes is healed and I think the area on his right dorsal foot is healed as well. There is no cellulitis or venous inflammation involving the right leg. He is going to need compression stockings here. 10/07/16; the patient's extensive wound on the left lateral calf and ankle does not measure any differently however there appears to be less adherent surface slough using Santyl and aggressive weekly debridements 10/21/16; no major change in the area on the left lateral calf. Still the same measurement still very difficult to debridement adherent slough and nonviable subcutaneous tissue. This is not really been helped by several weeks of Santyl. Previously for 2 weeks I used Iodoflex for [name] short period. [name] prolonged course of Hydrofera Blue didn't really help. I'm not sure why I only used 2 weeks of Iodoflex on this there is no evidence of surrounding infection. He has [name] small area on the right second toe which looks as though it's progressing towards closure 10/28/16; the wounds on his toes appear to be closed. No major change in the left lateral leg wound although the surface looks somewhat better using Iodoflex. He has had previous arterial studies that were normal. He has had reflux studies and is status post ablation although I don't have any exact notes on which vein was ablated. I'll need to check the surgical record 11/04/16; he's had [name] reopening between the first and second toe on the left and right. No major change in the left lateral leg wound. There is what appears to be cellulitis of the  left dorsal foot 11/18/16 the patient was hospitalized initially in [place] and then subsequently transferred to [facility] long and was admitted there from 11/09/16 through 11/12/16. He had developed progressive cellulitis on the right leg in spite of the doxycycline I gave him. I'd spoken to the hospitalist in [place] who was concerned about continuing leukocytosis. CT scan is what I suggested this was done which showed soft tissue swelling without evidence of osteomyelitis or an underlying abscess blood cultures were negative. At [facility] he was treated with vancomycin and Primaxin and then add an infectious disease consult. He was transitioned to Ceftaroline. He has been making progressive improvement. Overall [name] severe cellulitis of the right leg. He is been using silver alginate to her original wound on the left leg. The wounds in his toes on the right are closed there is [name] small open area on the base of the left second toe 11/26/15; the patient's right leg is much better although there is still some edema here this could be reminiscent from his severe cellulitis likely on  top of some degree of lymphedema. His left anterior leg wound has less surface slough as reported by her intake nurse. Small wound at the base of the left second toe 12/02/16; patient's right leg is better and there is no open wound here. His left anterior lateral leg wound continues to have [name] healthy-looking surface. Small wound at the base of the left second toe however there is erythema in the left forefoot which is worrisome 12/16/16; is no open wounds on his right leg. We took measurements for stockings. His left anterior lateral leg wound continues to have [name] healthy-looking surface. I'm not sure where we were with the Apligraf run through his insurance. We have been using Iodoflex. He has [name] thick eschar on the left first second toe interface, I suspect this may be fungal however there is no visible open 12/23/16; no open wound on  his right leg. He has 2 small areas left of the linear wound that was remaining last week. We have been using Prisma, I thought I have disclosed this week, we can only look forward to next week 01/03/17; the patient had concerning areas of erythema last week, already on doxycycline for UTI through his primary doctor. The erythema is absolutely no better there is warmth and swelling both medially from the left lateral leg wound and also the dorsal left foot. 01/06/17- Patient is here for follow-up evaluation of his left lateral leg ulcer and bilateral feet ulcers. He is on oral antibiotic therapy, tolerating that. Nursing staff and the patient states that the erythema is improved from Monday. 01/13/17; the predominant left lateral leg wound continues to be problematic. I had put Apligraf on him earlier this month once. However he subsequently developed what appeared to be an intense cellulitis around the left lateral leg wound. I gave him Dalvance I think on 2/12 perhaps 2/13 he continues on cefdinir. The erythema is still present but the warmth and swelling is improved. I am hopeful that the cellulitis part of this control. I wouldn't be surprised if there is an element of venous inflammation as well. 01/17/17. The erythema is present but better in the left leg. His left lateral leg wound still does not have [name] viable surface buttons certain parts of this long thin wound it appears like there has been improvement in dimensions. 01/20/17; the erythema still present but much better in the left leg. I'm thinking this is his usual degree of chronic venous inflammation. The wound on the left leg looks somewhat better. Is less surface slough 01/27/17; erythema is back to the chronic venous inflammation. The wound on the left leg is somewhat better. I am back to the point where I like to try an Apligraf once again 02/10/17; slight improvement in wound dimensions. Apligraf #2. He is completing his  doxycycline 02/14/17; patient arrives today having completed doxycycline last Thursday. This was supposed to be [name] nurse visit however once again he hasn't tense erythema from the medial part of his wound extending over the lower leg. Also erythema in his foot this is roughly in the same distribution as last time. He has baseline chronic venous inflammation however this is [name] lot worse than the baseline I have learned to accept the on him is baseline inflammation 02/24/17- patient is here for follow-up evaluation. He is tolerating compression therapy. His voicing no complaints or concerns he is here anticipating an Apligraf 03/03/17; he arrives today with an adherent necrotic surface. I don't think this is surface is going  to be amenable for Apligraf's. The erythema around his wound and on the left dorsal foot has resolved he is off antibiotics 03/10/17; better-looking surface today. I don't think he can tolerate Apligraf's. He tells me he had [name] wound VAC after [name] skin graft years ago to this area and they had difficulty with [name] seal. The erythema continues to be stable around this some degree of chronic venous inflammation but he also has recurrent cellulitis. We have been using Iodoflex 03/17/17; continued improvement in the surface and may be small changes in dimensions. Using Iodoflex which seems the only thing that will control his surface 03/24/17- He is here for follow up evaluation of his LLE lateral ulceration and ulcer to right dorsal foot/toe space. He is voicing no complaints or concerns, He is tolerating compression wrap. 03/31/17 arrives today with [name] much healthier looking wound on the left lower extremity. We have been using Iodoflex for [name] prolonged period of time which has for the first time prepared and adequate looking wound bed although we have not had much in the way of wound dimension improvement. He also has [name] small wound between the first and second toe on the right 04/07/17; arrives today  with [name] healthy-looking wound bed and at least the top 50% of this wound appears to be now her. No debridement was required I have changed him to [facility] last week after prolonged Iodoflex. He did not do well with Apligraf's. We've had [name] re-opening between the first and second toe on the right 04/14/17; arrives today with [name] healthier looking wound bed contractions and the top 50% of this wound and some on the lesser 50%. Wound bed appears healthy. The area between the first and second toe on the right still remains problematic 04/21/17; continued very gradual improvement. Using [facility] 04/28/17; continued very gradual improvement in the left lateral leg venous insufficiency wound. His periwound erythema is very mild. We have been using Hydrofera Blue. Wound is making progress especially in the superior 50% 05/05/17; he continues to have very gradual improvement in the left lateral venous insufficiency wound. Both in terms with an length rings are improving. I debrided this every 2 weeks with #5 curet and we have been using Hydrofera Blue and again making good progress With regards to the wounds between his right first and second toe which I thought might of been tinea pedis he is not making as much progress very dry scaly skin over the area. Also the area at the base of the left first and second toe in [name] similar condition 05/12/17; continued gradual improvement in the refractory left lateral venous insufficiency wound on the left. Dimension smaller. Surface still requiring debridement using Hydrofera Blue 05/19/17; continued gradual improvement in the refractory left lateral venous ulceration. Careful inspection of the wound bed underlying rumination suggested some degree of epithelialization over the surface no debridement indicated. Continue Hydrofera Blue difficult areas between his toes first and third on the left than first and second on the right. I'm going to change to silver alginate  from silver collagen. Continue ketoconazole as I suspect underlying tinea pedis 05/26/17; left lateral leg venous insufficiency wound. We've been using Hydrofera Blue. I believe that there is expanding epithelialization over the surface of the wound albeit not coming from the wound circumference. This is [name] bit of an odd situation in which the epithelialization seems to be coming from the surface of the wound rather than in the exact circumference. There is still small  open areas mostly along the lateral margin of the wound. ooHe has unchanged areas between the left first and second and the right first second toes which I been treating for tenia pedis 06/02/17; left lateral leg venous insufficiency wound. We have been using Hydrofera Blue. Somewhat smaller from the wound circumference. The surface of the wound remains [name] bit on it almost epithelialized sedation in appearance. I use an open curette today debridement in the surface of all of this especially the edges ooSmall open wounds remaining on the dorsal right first and second toe interspace and the plantar left first second toe and her face on the left 06/09/17; wound on the left lateral leg continues to be smaller but very gradual and very dry surface using Hydrofera Blue 06/16/17 requires weekly debridements now on the left lateral leg although this continues to contract. I changed to silver collagen last week because of dryness of the wound bed. Using Iodoflex to the areas on his first and second toes/web space bilaterally 06/24/17; patient with history of paraplegia also chronic venous insufficiency with lymphedema. Has [name] very difficult wound on the left lateral leg. This has been gradually reducing in terms of with but comes in with [name] very dry adherent surface. High switch to silver collagen [name] week or so ago with hydrogel to keep the area moist. This is been refractory to multiple dressing attempts. He also has areas in his first and second toes  bilaterally in the anterior and posterior web space. I had been using Iodoflex here after [name] prolonged course of silver alginate with ketoconazole was ineffective [question tinea pedis] 07/14/17; patient arrives today with [name] very difficult adherent material over his left lateral lower leg wound. He also has surrounding erythema and poorly controlled edema. He was switched his Santyl last visit which the nurses are applying once during his doctor visit and once on [name] nurse visit. He was also reduced to 2 layer compression I'm not exactly sure of the issue here. 07/21/17; better surface today after 1 week of Iodoflex. Significant cellulitis that we treated last week also better. [Doxycycline] 07/28/17 better surface today with now 2 weeks of Iodoflex. Significant cellulitis treated with doxycycline. He has now completed the doxycycline and he is back to his usual degree of chronic venous inflammation/stasis dermatitis. He reminds me he has had ablations surgery here 08/04/17; continued improvement with Iodoflex to the left lateral leg wound in terms of the surface of the wound although the dimensions are better. 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 Problematic areas on the plantar aspect of the first second toe web space on the left and the dorsal aspect of the first second toe web space on the right. At one point I felt these were probably related to chronic fungal infections in treated him aggressively for this although we have not made any improvement here. 08/11/17; left lateral leg. Surface continues to improve with the Iodoflex although we are not seeing much improvement in overall wound dimensions. Areas on his plantar left foot and right foot show no improvement. In fact the right foot looks somewhat worse 08/18/17; left lateral leg. We changed to [facility] Blue last week after [name] prolonged course of Iodoflex which helps get the  surface better. It appears that the wound with is improved. Continue with difficult areas on the left dorsal first second and plantar first second on the right 09/01/17; patient arrives in clinic today having  had [name] temperature of 103 yesterday. He was seen in the ER and [facility]. The patient was concerned he could have cellulitis again in the right leg however they diagnosed him with [name] UTI and he is now on Keflex. He has [name] history of cellulitis which is been recurrent and difficult but this is been in the left leg, in the past 5 use doxycycline. He does in and out catheterizations at home which are risk factors for UTI 09/08/17; patient will be completing his Keflex this weekend. The erythema on the left leg is considerably better. He has [name] new wound today on the medial part of the right leg small superficial almost looks like [name] skin tear. He has worsening of the area on the right dorsal first and second toe. His major area on the left lateral leg is better. Using Hydrofera Blue on all areas 09/15/17; gradual reduction in width on the long wound in the left lateral leg. No debridement required. He also has wounds on the plantar aspect of his left first second toe web space and on the dorsal aspect of the right first second toe web space. 09/22/17; there continues to be very gradual improvements in the dimensions of the left lateral leg wound. He hasn't round erythematous spot with might be pressure on his wheelchair. There is no evidence obviously of infection no purulence no warmth ooHe has [name] dry scaled area on the plantar aspect of the left first second toe ooImproved area on the dorsal right first second toe. 09/29/17; left lateral leg wound continues to improve in dimensions mostly with an is still [name] fairly long but increasingly narrow wound. ooHe has [name] dry scaled area on the plantar aspect of his left first second toe web space ooIncreasingly concerning area on the dorsal right first second  toe. In fact I am concerned today about possible cellulitis around this wound. The areas extending up his second toe and although there is deformities here almost appears to abut on the nailbed. 10/06/17; left lateral leg wound continues to make very gradual progress. Tissue culture I did from the right first second toe dorsal foot last time grew MRSA and enterococcus which was vancomycin sensitive. This was not sensitive to clindamycin or doxycycline. He is allergic to Zyvox and sulfa we have therefore arrange for him to have dalvance infusion tomorrow. He is had this in the past and tolerated it well 10/20/17; left lateral leg wound continues to make decent progress. This is certainly reduced in terms of with there is advancing epithelialization.ooThe cellulitis in the right foot looks better although he still has [name] deep wound in the dorsal aspect of the first second toe web space. Plantar left first toe web space on the left I think is making some progress 10/27/17; left lateral leg wound continues to make decent progress. Advancing epithelialization.using Hydrofera Blue ooThe right first second toe web space wound is better-looking using silver alginate ooImprovement in the left plantar first second toe web space. Again using silver alginate 11/03/17 left lateral leg wound continues to make decent progress albeit slowly. Using Hydrofera Blue ooThe right per second toe web space continues to be [name] very problematic looking punched out wound. I obtained [name] piece of tissue for deep culture I did extensively treated this for fungus. It is difficult to imagine that this is [name] pressure area as the patient states other than going outside he doesn't really wear shoes at home ooThe left plantar first second toe web space looked  fairly senescent. Necrotic edges. This required debridement oochange to Hydrofera Blue to all wound areas 11/10/17; left lateral leg wound continues to contract. Using Hydrofera  Blue ooOn the right dorsal first second toe web space dorsally. Culture I did of this area last week grew MRSA there is not an easy oral option in this patient was multiple antibiotic allergies or intolerances. This was only [name] rare culture isolate I'm therefore going to use Bactroban under silver alginate ooOn the left plantar first second toe web space. Debridement is required here. This is also unchanged 11/17/17; left lateral leg wound continues to contract using Hydrofera Blue this is no longer the major issue. ooThe major concern here is the right first second toe web space. He now has an open area going from dorsally to the plantar aspect. There is now wound on the inner lateral part of the first toe. Not [name] very viable surface on this. There is erythema spreading medially into the forefoot. ooNo major change in the left first second toe plantar wound 11/24/17; left lateral leg wound continues to contract using Hydrofera Blue. Nice improvement today ooThe right first second toe web space all of this looks [name] lot less angry than last week. I have given him clindamycin and topical Bactroban for MRSA and terbinafine for the possibility of underlining tinea pedis that I could not control with ketoconazole. Looks somewhat better ooThe area on the plantar left first second toe web space is weeping with dried debris around the wound 12/01/17; left lateral leg wound continues to contract he Hydrofera Blue. It is becoming thinner in terms of with nevertheless it is making good improvement. ooThe right first second toe web space looks less angry but still [name] large necrotic-looking wounds starting on the plantar aspect of the right foot extending between the toes and now extensively on the base of the right second toe. I gave him clindamycin and topical Bactroban for MRSA anterior benefiting for the possibility of underlying tinea pedis. Not looking better today ooThe area on the left first/second toe  looks better. Debrided of necrotic debris 12/05/17* the patient was worked in urgently today because over the weekend he found blood on his incontinence bad when he woke up. He was found to have an ulcer by his wife who does most of his wound care. He came in today for [place] to look at this. He has not had [name] history of wounds in his buttocks in spite of his paraplegia. 12/08/17; seen in follow-up today at his usual appointment. He was seen earlier this week and found to have [name] new wound on his buttock. We also follow him for wounds on the left lateral leg, left first second toe web space and right first second toe web space 12/15/17; we have been using Hydrofera Blue to the left lateral leg which has improved. The right first second toe web space has also improved. Left first second toe web space plantar aspect looks stable. The left buttock has worsened using Santyl. Apparently the buttock has drainage 12/22/17; we have been using Hydrofera Blue to the left lateral leg which continues to improve now 2 small wounds separated by normal skin. He tells [place] he had [name] fever up to 100 yesterday he is prone to UTIs but has not noted anything different. He does in and out catheterizations. The area between the first and second toes today does not look good necrotic surface covered with what looks to be purulent drainage and erythema extending into  the third toe. I had gotten this to something that I thought look better last time however it is not look good today. He also has [name] necrotic surface over the buttock wound which is expanded. I thought there might be infection under here so I removed [name] lot of the surface with [name] #5 curet though nothing look like it really needed culturing. He is been using Santyl to this area 12/27/17; his original wound on the left lateral leg continues to improve using Hydrofera Blue. I gave him samples of Baxdella although he was unable to take them out of fear for an allergic reaction ["lump  in his throat"].the culture I did of the purulent drainage from his second toe last week showed both enterococcus and [name] set Enterobacter I was also concerned about the erythema on the bottom of his foot although paradoxically although this looks somewhat better today. 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 We've been using silver alginate here. They came up today that he sleeps in [name] recliner, I'm not sure why but I asked him to stop this 01/03/18; his original wound we've been using Hydrofera Blue is now separated into 2 areas. ooUlcer on his left buttock is better he is off the recliner and sleeping in bed ooFinally both wound areas between his first and second toes also looks some better 01/10/18; his original wound on the left lateral leg is now separated into 2 wounds we've been using Hydrofera Blue ooUlcer on his left buttock has some drainage. There is [name] small probing site going into muscle layer superiorly.using silver alginate -He arrives today with [name] deep tissue injury on the left heel ooThe wound on the dorsal aspect of his first second toe on the left looks [name] lot betterusing silver alginate ketoconazole ooThe area on the first second toe web space on the right also looks [name] lot bette 01/17/18; his original wound on the left lateral leg continues to progress using Hydrofera Blue ooUlcer on his left buttock also is smaller surface healthier except for [name] small probing site going into the muscle layer superiorly. 2.4 cm of tunneling in this area ooDTI on his left heel we have only been offloading. Looks better than last week no threatened open no evidence of infection oothe wound on the dorsal aspect of the first second toe on the left continues to look like it's regressing we have only been using silver alginate and terbinafine orally ooThe area in the first second toe web space on the  right also looks to be [name] lot better using silver alginate and terbinafine I think this was prompted by tinea pedis 01/31/18; the patient was hospitalized in [place] last week apparently for [name] complicated UTI. He was discharged on cefepime he does in and out catheterizations. In the hospital he was discovered M I don't mild elevation of AST and ALT and the terbinafine was stopped.predictably the pressure ulcer on s his buttock looks betterusing silver alginate. The area on the left lateral leg also is better using Hydrofera Blue. The area between the first and second toes on the left better. First and second toes on the right still substantial but better. Finally the DTI on the left heel has held together and looks like it's resolving 02/07/18-he is here in follow-up evaluation for multiple ulcerations. He has new injury to the lateral aspect of the last issue [name] pressure ulcer, he states  this is from adhesive removal trauma. He states he has tried multiple adhesive products with no success. All other ulcers appear stable. The left heel DTI is resolving. We will continue with same treatment plan and follow-up next week. 02/14/18; follow-up for multiple areas. ooHe has [name] new area last week on the lateral aspect of his pressure ulcer more over the posterior trochanter. The original pressure ulcer looks quite stable has healthy granulation. 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 Almost fully epithelialized on the original superior area using Hydrofera Blue ooDTI on the left heel has peeled off this week to reveal [name] small superficial wound under denuded skin and subcutaneous tissue ooBoth areas between the first and second toes look better including nothing open on the left 02/21/18; ooThe patient's wounds on his left ischial tuberosity and posterior left greater trochanter actually looked  better. He has [name] large area of irritation around the area which I think is contact dermatitis. I am doubtful that this is fungal ooHis original wound on the left lateral calf continues to improve we have been using Hydrofera Blue ooThere is no open area in the left first second toe web space although there is [name] lot of thick callus ooThe DTI on the left heel required debridement today of necrotic surface eschar and subcutaneous tissue using silver alginate ooFinally the area on the right first second toe webspace continues to contract using silver alginate and ketoconazole 02/28/18 ooLeft ischial tuberosity wounds look better using silver alginate. ooOriginal wound on the left calf only has one small open area left using Hydrofera Blue ooDTI on the left heel required debridement mostly removing skin from around this wound surface. Using silver alginate ooThe areas on the right first/second toe web space using silver alginate and ketoconazole 03/08/18 on evaluation today patient appears to be doing decently well as best I can tell in regard to his wounds. This is the first time that I have seen him as he generally is followed by Dr. Dellia Nims. With that being said none of his wounds appear to be infected he does have an area where there is some skin covering what appears to be [name] new wound on the left dorsal surface of his great toe. This is right at the nail bed. With that being said I do believe that debrided away some of the excess skin can be of benefit in this regard. Otherwise he has been tolerating the dressing changes without complication. 03/14/18; patient arrives today with the multiplicity of wounds that we are following. He has not been systemically unwell ooOriginal wound on the left lateral calf now only has 2 small open areas we've been using Hydrofera Blue which should continue ooThe deep tissue injury on the left heel requires debridement today. We've been using silver  alginate ooThe left first second toe and the right first second toe are both are reminiscence what I think was tinea pedis. Apparently some of the callus Surface between the toes was removed last week when it started draining. ooPurulent drainage coming from the wound on the ischial tuberosity on the left. 03/21/18-He is here in follow-up evaluation for multiple wounds. 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 He tolerated debridement. The only change to last week's recommendations is to discontinue antifungal cream between toes. He will follow-up next week 03/28/18; following up for multiple wounds;Concern this  week is streaking redness and swelling in the right foot. He is going to need antibiotics for this. 03/31/18; follow-up for right foot cellulitis. Streaking redness and swelling in the right foot on 03/28/18. 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 I put him on clindamycin 300 mg every 6 and brought him in for [name] quick check. He has an open wound between his first and second toes on the right foot as [name] potential source. 04/04/18; ooRight foot cellulitis is resolving he is completing clindamycin. This is truly good news ooLeft lateral calf wound which is initial wound only has one small open area inferiorly this is close to healing out. He has compression stockings. We will use Hydrofera Blue right down to the epithelialization of this ooNonviable surface on the left heel which was initially pressure with [name] DTI. We've been using Hydrofera Blue. I'm going to switch this back to silver alginate ooLeft first second toe/tinea pedis this looks better using silver alginate ooRight first second toe tinea pedis using silver alginate ooLarge pressure ulcers on theLeft ischial tuberosity. Small wound here Looks better. I am uncertain about the surface over the large wound. Using  silver alginate 04/11/18; ooCellulitis in the right foot is resolved ooLeft lateral calf wound which was his original wounds still has 2 tiny open areas remaining this is just about closed ooNonviable surface on the left heel is better but still requires debridement ooLeft first second toe/tinea pedis still open using silver alginate ooRight first second toe wound tinea pedis I asked him to go back to using ketoconazole and silver alginate ooLarge pressure ulcers on the left ischial tuberosity this shear injury here is resolved. Wound is smaller. No evidence of infection using silver alginate 04/18/18; ooPatient arrives with an intense area of cellulitis in the right mid lower calf extending into the right heel area. Bright red and warm. Smaller area on the left anterior leg. He has [name] significant history of MRSA. He will definitely need antibioticsoodoxycycline ooHe now has 2 open areas on the left ischial tuberosity the original large wound and now [name] satellite area which I think was above his initial satellite areas. Not [name] wonderful surface on this satellite area surrounding erythema which looks like pressure related. ooHis left lateral calf wound again his original wound is just about closed ooLeft heel pressure injury still requiring debridement ooLeft first second toe looks [name] lot better using silver alginate ooRight first second toe also using silver alginate and ketoconazole cream also looks better 04/20/18; the patient was worked in early today out of concerns with his cellulitis on the right leg. I had started him on doxycycline. This was 2 days ago. His wife was concerned about the swelling in the area. Also concerned about the left buttock. He has not been systemically unwell no fever chills. No nausea vomiting or diarrhea 04/25/18; the patient's left buttock wound is continued to deteriorate he is using Hydrofera Blue. He is still completing clindamycin for the cellulitis on  the right leg although all of this looks better. 05/02/18 ooLeft buttock wound still with [name] lot of drainage and [name] very tightly adherent fibrinous necrotic surface. He has [name] deeper area superiorly ooThe left lateral calf wound is still closed ooDTI wound on the left heel necrotic surface especially the circumference using Iodoflex ooAreas between his left first second toe and right first second toe both look better. Dorsally and the right first second toe he had [name] necrotic surface although at smaller.  In using silver alginate and ketoconazole. I did [name] culture last week which was [name] deep tissue culture of the reminiscence of the open wound on the right first second toe dorsally. This grew [name] few Acinetobacter and [name] few methicillin-resistant staph aureus. Nevertheless the area actually this week looked better. I didn't feel the need to specifically address this at least in terms of systemic antibiotics. 05/09/18; wounds are measuring larger more drainage per our intake. We are using Santyl covered with alginate on the large superficial buttock wounds, Iodosorb on the left heel, ketoconazole and silver alginate to the dorsal first and second toes bilaterally. 05/16/18; ooThe area on his left buttock better in some aspects although the area superiorly over the ischial tuberosity required an extensive debridement.using Santyl ooLeft heel appears stable. Using Iodoflex ooThe areas between his first and second toes are not bad however there is spreading erythema up the dorsal aspect of his left foot this looks like cellulitis again. He is insensate the erythema is really very brilliant.o Erysipelas He went to see an allergist days ago because he was itching part of this he had lab work done. This showed [name] white count of 15.1 with 70% neutrophils. Hemoglobin of 11.4 and [name] platelet count of 659,000. Last white count we had in [facility] was [name] 2-1/2 years ago which was 25.9 but he was ill at the time. He  was able to show me some lab work that was done by his primary physician the pattern is about the same. 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 But prompted me to go ahead and do x-rays of both feet and the pelvis rule out osteomyelitis. He also had [name] comprehensive metabolic panel this was reasonably normal his albumin was 3.7 liver function tests BUN/creatinine all normal 05/23/18; x-rays of both his feet from last week were negative for underlying pulmonary abnormality. The x-ray of his pelvis however showed mild irregularity in the left ischial which may represent some early osteomyelitis. The wound in the left ischial continues to get deeper clearly now exposed muscle. Each week necrotic surface material over this area. Whereas the rest of the wounds do not look so bad. ooThe left ischial wound we have been using Santyl and calcium alginate ooT the left heel surface necrotic debris using Iodoflex o ooThe left lateral leg is still healed ooAreas on the left dorsal foot and the right dorsal foot are about the same. There is some inflammation on the left which might represent contact dermatitis, fungal dermatitis I am doubtful cellulitis although this looks better than last week 05/30/18; CT scan done at Hospital did not show any osteomyelitis or abscess. Suggested the possibility of underlying cellulitis although I don't see [name] lot of evidence of this at the bedside ooThe wound itself on the left buttock/upper thigh actually looks somewhat better. No debridement ooLeft heel also looks better no debridement continue Iodoflex ooBoth dorsal first second toe spaces appear better using Lotrisone. Left still required debridement 06/06/18; ooIntake reported some purulent looking drainage from the left gluteal wound. Using Santyl and calcium alginate ooLeft heel looks better although still [name] nonviable surface requiring debridement ooThe left dorsal  foot first/second webspace actually expanding and somewhat deeper. I may consider doing [name] shave biopsy of this area ooRight dorsal foot first/second webspace appears stable to improved. Using Lotrisone and silver alginate to both these areas 06/13/18 ooLeft gluteal surface looks better. Now separated in the 2 wounds.  No debridement required. Still drainage. We'll continue silver alginate ooLeft heel continues to look better with Iodoflex continue this for at least another week ooOf his dorsal foot wounds the area on the left still has some depth although it looks better than last week. We've been using Lotrisone and silver alginate 06/20/18 ooLeft gluteal continues to look better healthy tissue ooLeft heel continues to look better healthy granulation wound is smaller. He is using Iodoflex and his long as this continues continue the Iodoflex ooDorsal right foot looks better unfortunately dorsal left foot does not. There is swelling and erythema of his forefoot. He had minor trauma to this several days ago but doesn't think this was enough to have caused any tissue injury. Foot looks like cellulitis, we have had this problem before 06/27/18 on evaluation today patient appears to be doing [name] little worse in regard to his foot ulcer. 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 Both of which would really be his only options for treating this infection. In the past he has been given and effusion of Orbactiv. This is done very well for him in the past again it's one time dosing IV antibiotic therapy. Subsequently I do believe this is something we're gonna need to see about doing at this point in time. Currently his other wounds seem to be doing somewhat better in my pinion I'm pretty happy in that regard. 07/03/18 on evaluation today patient's wounds actually appear  to be doing fairly well. He has been tolerating the dressing changes without complication. All in all he seems to be showing signs of improvement. In regard to the antibiotics he has been dealing with infectious disease since I saw him last week as far as getting this scheduled. In the end [facility]'s [facility] this coming Friday. In the meantime he has been continuing to perform the dressing changes in such as previous. There does not appear to be any evidence of infection worsengin at this time. 07/10/18; ooSince I last saw this man 2 weeks ago things have actually improved. IV antibiotics of resulted in less forefoot erythema although there is still some present. He is not systemically unwell ooLeft buttock wounds o2 now have no depth there is increased epithelialization Using silver alginate ooLeft heel still requires debridement using Iodoflex ooLeft dorsal foot still with [name] sizable wound about the size of [name] border but healthy granulation ooRight dorsal foot still with [name] slitlike area using silver alginate 07/18/18; the patient's cellulitis in the left foot is improved in fact I think it is on its way to resolving. ooLeft buttock wounds o2 both look better although the larger one has hypertension granulation we've been using silver alginate ooLeft heel has some thick circumferential redundant skin over the wound edge which will need to be removed today we've been using Iodoflex ooLeft dorsal foot is still [name] sizable wound required debridement using silver alginate ooThe right dorsal foot is just about closed only [name] small open area remains here 07/25/18; left foot cellulitis is resolved ooLeft buttock wounds o2 both look better. Hyper-granulation on the major area ooLeft heel as some debris over the surface but otherwise looks [name] healthier wound. Using silver collagen ooRight dorsal foot is just about closed 07/31/18; arrives with  our intake nurse worried about purulent drainage  from the buttock. We had hyper-granulation here last week ooHis buttock wounds o2 continue to look better ooLeft heel some debris over the surface but measuring smaller. ooRight dorsal foot unfortunately has openings between the toes ooLeft foot superficial wound looks less aggravated. 08/07/18 ooButtock wounds continue to look better although some of her granulation and the larger medial wound. silver alginate ooLeft heel continues to look [name] lot better.silver collagen ooLeft foot superficial wound looks less stable. Requires debridement. He has [name] new wound superficial area on the foot on the lateral dorsal foot. ooRight foot looks better using silver alginate without Lotrisone 08/14/2018; patient was in the ER last week diagnosed with [name] UTI. He is now on Cefpodoxime and Macrodantin. ooButtock wounds continued to be smaller. Using silver alginate ooLeft heel continues to look better using silver collagen ooLeft foot superficial wound looks as though it is improving ooRight dorsal foot area is just about healed. 08/21/2018; patient is completed his antibiotics for his UTI. ooHe has 2 open areas on the buttocks. There is still not closed although the surface looks satisfactory. Using silver alginate ooLeft heel continues to improve using silver collagen ooThe bilateral dorsal foot areas which are at the base of his first and second toes/possible tinea pedis are actually stable on the left but worse on the right. The area on the left required debridement of necrotic surface. After debridement I obtained [name] specimen for PCR culture. ooThe right dorsal foot which is been just about healed last week is now reopened 08/28/2018; culture done on the left dorsal foot showed coag negative staph both staph epidermidis and Lugdunensis. I think this is worthwhile initiating systemic treatment. I will use doxycycline given his long list of  allergies. The area on the left heel slightly improved but still requiring debridement. ooThe large wound on the buttock is just about closed whereas the smaller one is larger. Using silver alginate in this area 09/04/2018; patient is completing his doxycycline for the left foot although this continues to be [name] very difficult wound area with very adherent necrotic debris. We are using silver alginate to all his wounds right foot left foot and the small wounds on his buttock, silver collagen on the left heel. 09/11/2018; once again this patient has intense erythema and swelling of the left forefoot. Lesser degrees of erythema in the right foot. He has [name] long list of allergies and intolerances. I will reinstitute doxycycline. 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 Using silver alginate ooLeft heel also looks better using silver collagen ooUnfortunately both the areas on his feet look worse. The area on the left first second webspace is now gone through to the plantar part of his foot. The area on the left foot anteriorly is irritated with erythema and swelling in the forefoot. 09/25/2018 ooHis wound on the left plantar heel looks better. Using silver collagen ooThe area on the left buttock 2 small remnant areas. One is closed one is still open. Using silver alginate ooThe areas between both his first and second toes look worse. This in spite of long-standing antifungal therapy with ketoconazole and silver alginate which should have antifungal activity ooHe has small areas around his original wound on the left calf one is on the bottom of the original scar tissue and one superiorly both of these are small and superficial but again given wound history in this site this is worrisome 10/02/2018 ooLeft plantar heel continues to gradually  contract using silver collagen ooLeft buttock wound is unchanged using silver alginate ooThe areas on his  dorsal feet between his first and second toes bilaterally look about the same. 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 Small open area here. We have been using silver alginate 10/09/2018; ooLeft plantar heel continues to look healthy and contract. No debridement is required ooLeft buttock slightly smaller with [name] tape injury wound just below which was new this week ooDorsal feet somewhat improved I have been using clindamycin ooLeft lateral looks lower extremity the actual open area looks worse although [name] lot of this is epithelialized. I am going to change to silver collagen today He has [name] lot more swelling in the right leg although this is not pitting not red and not particularly warm there is [name] lot of spasm in the right leg usually indicative of people with paralysis of some underlying discomfort. We have reviewed his vascular status from 2017 he had [name] left greater saphenous vein ablation. I wonder about referring him back to vascular surgery if the area on the left leg continues to deteriorate. 10/16/2018 in today for follow-up and management of multiple lower extremity ulcers. His left Buttock wound is much lower smaller and almost closed completely. The wound to the left ankle has began to reopen with Epithelialization and some adherent slough. He has multiple new areas to the left foot and leg. The left dorsal foot without much improvement. Wound present between left great webspace and 2nd toe. Erythema and edema present right leg. Right LE ultrasound obtained on 10/10/18 was negative for DVT . 10/23/2018; ooLeft buttock is closed over. Still dry macerated skin but there is no open wound. I suspect this is chronic pressure/moisture ooLeft lateral calf is quite [name] bit worse than when I saw this last. There is  clearly drainage here he has macerated skin into the left plantar heel. We will change the primary dressing to alginate ooLeft dorsal foot has some improvement in overall wound area. Still using clindamycin and silver alginate ooRight dorsal foot about the same as the left using clindamycin and silver alginate ooThe erythema in the right leg has resolved. He is DVT rule out was negative ooLeft heel pressure area required debridement although the wound is smaller and the surface is health 10/26/2018 ooThe patient came back in for his nurse check today predominantly because of the drainage coming out of the left lateral leg with [name] recent reopening of his original wound on the left lateral calf. He comes in today with [name] large amount of surrounding erythema around the wound extending from the calf into the ankle and even in the area on the dorsal foot. He is not systemically unwell. He is not febrile. Nevertheless this looks like cellulitis. We have been using silver alginate to the area. I changed him to [name] regular visit and I am going to prescribe him doxycycline. The rationale here is [name] long list of medication intolerances and [name] history of MRSA. I did not see anything that I thought would provide [name] valuable culture 10/30/2018 ooFollow-up from his appointment 4 days ago with really an extensive area of cellulitis in the left calf left lateral ankle and left dorsal foot. I put him on doxycycline. He has [name] long list of medication allergies which are true allergy reactions. 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 In any case he is [name] lot better today. The erythema especially in the anterior and lateral left calf is better. He still has left ankle erythema. He also is complaining about increasing edema in the right leg we have only been using Kerlix Coban and he has been doing the wraps at home. Finally he has [name] spotty rash on the  medial part of his upper left calf which looks like folliculitis or perhaps wrap occlusion type injury. Small superficial macules not pustules 11/06/18 patient arrives today with again [name] considerable degree of erythema around the wound on the left lateral calf extending into the dorsal ankle and dorsal foot. This is [name] lot worse than when I saw this last week. He is on doxycycline really with not [name] lot of improvement. He has not been systemically unwell Wounds on the; left heel actually looks improved. Original area on the left foot and proximity to the first and second toes looks about the same. He has superficial areas on the dorsal foot, anterior calf and then the reopening of his original wound on the left lateral calf which looks about the same ooThe only area he has on the right is the dorsal webspace first and second which is smaller. ooHe has [name] large area of dry erythematous skin on the left buttock small open area here. 11/13/2018; the patient arrives in much better condition. The erythema around the wound on the left lateral calf is [name] lot better. Not sure whether this was the clindamycin or the TCA and ketoconazole or just in the improvement in edema control [stasis dermatitis]. In any case this is [name] lot better. The area on the left heel is very small and just about resolved using silver collagen we have been using silver alginate to the areas on his dorsal feet 11/20/2018; his wounds include the left lateral calf, left heel, dorsal aspects of both feet just proximal to the first second webspace. He is stable to slightly improved. I did not think any changes to his dressings were going to be necessary 11/27/2018 he has [name] reopening on the left buttock which is surrounded by what looks like tinea or perhaps some other form of dermatitis. The area on the left dorsal foot has some erythema around it I have marked this area but I am not sure whether this is cellulitis or not. Left heel is not  closed. Left calf the reopening is really slightly longer and probably worse 1/13; in general things look better and smaller except for the left dorsal foot. Area on the left heel is just about closed, left buttock looks better only [name] small wound remains in the skin looks better [using Lotrisone] 1/20; the area on the left heel only has [name] few remaining open areas here. Left lateral calf about the same in terms of size, left dorsal foot slightly larger right lateral foot still not closed. The area on the left buttock has no open wound and the surrounding skin looks [name] lot better 1/27; the area on the left heel is closed. Left lateral calf better but still requiring extensive debridements. The area on his left buttock is closed. He still has the open areas on the left dorsal foot which is slightly smaller in the right foot which is slightly expanded. We have been using Iodoflex on these areas as well 2/3; left heel is closed. Left lateral calf still requiring debridement  using Iodoflex there is no open area on his left buttock however he has dry scaly skin over [name] large area of this. Not really responding well to the Lotrisone. Finally the areas on his dorsal feet at the level of the first second webspace are slightly smaller on the right and about the same on the left. Both of these vigorously debrided with Anasept and gauze 2/10; left heel remains closed he has dry erythematous skin over the left buttock but there is no open wound here. Left lateral leg has come in and with. Still requiring debridement we have been using Iodoflex here. Finally the area on the left dorsal foot and right dorsal foot are really about the same extremely dry callused fissured areas. He does not yet have [name] dermatology appointment 2/17; left heel remains closed. He has [name] new open area on the left buttock. The area on the left lateral calf is bigger longer and still covered in necrotic debris. No major change in his foot areas  bilaterally. I am awaiting for [name] dermatologist to look on this. We have been using ketoconazole I do not know that this is been doing any good at all. 2/24; left heel remains closed. The left buttock wound that was new reopening last week looks better. The left lateral calf appears better also although still requires debridement. The major area on his foot is the left first second also requiring debridement. We have been putting Prisma on all wounds. I do not believe that the ketoconazole has done too much good for his feet. He will use Lotrisone I am going to give him [name] 2-week course of terbinafine. We still do not have [name] dermatology appointment 3/2 left heel remains closed however there is skin over bone in this area I pointed this out to him today. The left buttock wound is epithelialized but still does not look completely stable. The area on the left leg required debridement were using silver collagen here. With regards to his feet we changed to Lotrisone last week and silver alginate. 3/9; left heel remains closed. Left buttock remains closed. The area on the right foot is essentially closed. The left foot remains unchanged. Slightly smaller on the left lateral calf. Using silver collagen to both of these areas 3/16-Left heel remains closed. Area on right foot is closed. Left lateral calf above the lateral malleolus open wound requiring debridement with easy bleeding. Left dorsal wound proximal to first toe also debrided. Left ischial area open new. Patient has been using Prisma with wrapping every 3 days. Dermatology appointment is apparently tomorrow.Patient has completed his terbinafine 2-week course with some apparent improvement according to him, there is still flaking and dry skin in his foot on the left 3/23; area on the right foot is reopened. The area on the left anterior foot is about the same still [name] very necrotic adherent surface. He still has the area on the left leg and reopening  is on the left buttock. He apparently saw dermatology although I do not have [name] note. According to the patient who is usually fairly well informed they did not have any good ideas. Put him on oral terbinafine which she is been on before. 3/30; using silver collagen to all wounds. Apparently his dermatologist put him on doxycycline and rifampin presumably some culture grew staph. I do not have this result. He remains on terbinafine although I have used terbinafine on him before 4/6; patient has had [name] fairly substantial reopening on the right  foot between the first and second toes. He is finished his terbinafine and I believe is on doxycycline and rifampin still as prescribed by dermatology. We have been using silver collagen to all his wounds although the patient reports that he thinks silver alginate does better on the wounds on his buttock. 4/13; the area on his left lateral calf about the same size but it did not require debridement. ooLeft dorsal foot just proximal to the webspace between the first and second toes is about the same. Still nonviable surface. I note some superficial bronze discoloration of the dorsal part of his foot ooRight dorsal foot just proximal to the first and second toes also looks about the same. I still think there may be the same discoloration I noted above on the left ooLeft buttock wound looks about the same 4/20; left lateral calf appears to be gradually contracting using silver collagen. 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 The left dorsal foot wound is debrided of tightly adherent necrotic debris and really cleans up quite nicely. The right area is worse with expansion. I did not debride this it is now over the base of the second toe ooThe area on his left buttock is smaller no debridement is required using silver collagen 5/4; left calf continues to make good progress. ooHe arrives with erythema  around the wounds on his dorsal foot which even extends to the plantar aspect. Very concerning for coexistent infection. He is finished the erythromycin I gave him for possible erythrasma this does not seem to have helped. ooThe area on the left foot is about the same base of the dorsal toes ooIs area on the buttock looks improved on the left 5/11; left calf and left buttock continued to make good progress. Left foot is about the same to slightly improved. ooMajor problem is on the right foot. He has not had an x-ray. Deep tissue culture I did last week showed both Enterobacter and E. coli. I did not change the doxycycline I put him on empirically although neither 1 of these were plated to doxycycline. He arrives today with the erythema looking worse on both the dorsal and plantar foot. Macerated skin on the bottom of the foot. he has not been systemically unwell 5/18-Patient returns at 1 week, left calf wound appears to be making some progress, left buttock wound appears slightly worse than last time, left foot wound looks slightly better, right foot redness is marginally better. X-ray of both feet show no air or evidence of osteomyelitis. Patient is finished his Omnicef and terbinafine. He continues to have macerated skin on the bottom of the left foot as well as right 5/26; left calf wound is better, left buttock wound appears to have multiple small superficial open areas with surrounding macerated skin. X-rays that I did last time showed no evidence of osteomyelitis in either foot. He is finished cefdinir and doxycycline. I do not think that he was on terbinafine. He continues to have [name] large superficial open area on the right foot anterior dorsal and slightly between the first and second toes. I did send him to dermatology 2 months ago or so wondering about whether they would do [name] fungal scraping. I do not believe they did but did do [name] culture. We have been using silver alginate to the  toe areas, he has been using antifungals at home topically either ketoconazole or Lotrisone. We are using silver collagen on the left foot, silver alginate  on the right, silver collagen on the left lateral leg and silver alginate on the left buttock 6/1; left buttock area is healed. We have the left dorsal foot, left lateral leg and right dorsal foot. We are using silver alginate to the areas on both feet and silver collagen to the area on his left lateral calf 6/8; the left buttock apparently reopened late last week. He is not really sure how this happened. He is tolerating the terbinafine. Using silver alginate to all wounds 6/15; left buttock wound is larger than last week but still superficial. ooCame in the clinic today with [name] report of purulence from the left lateral leg I did not identify any infection ooBoth areas on his dorsal feet appear to be better. He is tolerating the terbinafine. Using silver alginate to all wounds 6/22; left buttock is about the same this week, left calf quite [name] bit better. His left foot is about the same however he comes in with erythema and warmth in the right forefoot once again. Culture that I gave him in the beginning of May showed Enterobacter and E. coli. I gave him doxycycline and things seem to improve although neither 1 of these organisms was specifically plated. 6/29; left buttock is larger and dry this week. Left lateral calf looks to me to be improved. Left dorsal foot also somewhat improved right foot completely unchanged. The erythema on the right foot is still present. He is completing the Ceftin dinner that I gave him empirically [see discussion above.) 7/6 - All wounds look to be stable and perhaps improved, the left buttock wound is slightly smaller, per patient bleeds easily, completed ceftin, the right foot redness is less, he is on terbinafine 7/13; left buttock wound about the same perhaps slightly narrower. Area on the left lateral leg  continues to narrow. Left dorsal foot slightly smaller right foot about the same. We are using silver alginate on the right foot and Hydrofera Blue to the areas on the left. Unna boot on the left 2 layer compression on the right 7/20; left buttock wound absolutely the same. Area on lateral leg continues to get better. Left dorsal foot require debridement as did the right no major change in the 7/27; left buttock wound the same size necrotic debris over the surface. The area on the lateral leg is closed once again. His left foot looks better right foot about the same although there is some involvement now of the posterior first second toe area. He is still on terbinafine which I have given him for [name] month, not certain [name] centimeter major change 06/25/19-All wounds appear to be slightly improved according to report, left buttock wound looks clean, both foot wounds have minimal to no debris the right dorsal foot has minimal slough. We are using Hydrofera Blue to the left and silver alginate to the right foot and ischial wound. 8/10-Wounds all appear to be around the same, the right forefoot distal part has some redness which was not there before, however the wound looks clean and small. Ischial wound looks about the same with no changes 8/17; his wound on the left lateral calf which was his original chronic venous insufficiency wound remains closed. Since I last saw him the areas on the left dorsal foot right dorsal foot generally appear better but require debridement. The area on his left initial tuberosity appears somewhat larger to me perhaps hyper granulated and bleeds very easily. We have been using Hydrofera Blue to the left dorsal foot  and silver alginate to everything else 8/24; left lateral calf remains closed. The areas on his dorsal feet on the webspace of the first and second toes bilaterally both look better. The area on the left buttock which is the pressure ulcer stage II slightly smaller. I  change the dressing to Hydrofera Blue to all areas 8/31; left lateral calf remains closed. The area on his dorsal feet bilaterally look better. Using Hydrofera Blue. Still requiring debridement on the left foot. No change in the left buttock pressure ulcers however 9/14; left lateral calf remains closed. Dorsal feet look quite [name] bit better than 2 weeks ago. Flaking dry skin also [name] lot better with the ammonium lactate I gave him 2 weeks ago. The area on the left buttock is improved. He states that his Roho cushion developed [name] leak and he is getting [name] new one, in the interim he is offloading this vigorously 9/21; left calf remains closed. Left heel which was [name] possible DTI looks better this week. He had macerated tissue around the left dorsal foot right foot looks satisfactory and improved left buttock wound. I changed his dressings to his feet to silver alginate bilaterally. Continuing Hydrofera Blue on the left buttock. 9/28 left calf remains closed. Left heel did not develop anything [possible DTI] dry flaking skin on the left dorsal foot. Right foot looks satisfactory. Improved left buttock wound. We are using silver alginate on his feet Hydrofera Blue on the buttock. I have asked him to go back to the Lotrisone on his feet including the wounds and surrounding areas 10/5; left calf remains closed. The areas on the left and right feet about the same. [name] lot of this is epithelialized however debris over the remaining open areas. He is using Lotrisone and silver alginate. The area on the left buttock using Hydrofera Blue 10/26. Patient has been out for 3 weeks secondary to Covid concerns. He tested negative but I think his wife tested positive. He comes in today with the left foot substantially worse, right foot about the same. Even more concerning he states that the area on his left buttock closed over but then reopened and is considerably deeper in one aspect than it was before [stage III  wound] 11/2; left foot really about the same as last week. Quarter sized wound on the dorsal foot just proximal to the first second toes. Surrounding erythema with areas of denuded epithelium. This is not really much different looking. Did not look like cellulitis this time however. ooRight foot area about the same.. We have been using silver alginate alginate on his toes ooLeft buttock still substantial irritated skin around the wound which I think looks somewhat better. We have been using Hydrofera Blue here. 11/9; left foot larger than last week and [name] very necrotic surface. Right foot I think is about the same perhaps slightly smaller. Debris around the circumference also addressed. Unfortunately on the left buttock there is been [name] decline. Satellite lesions below the major wound distally and now [name] an additional one posteriorly we have been using Hydrofera Blue but I think this is [name] pressure issue 11/16; left foot ulcer dorsally again [name] very adherent necrotic surface. Right foot is about the same. Not much change in the pressure ulcer on his left buttock. 11/30; left foot ulcer dorsally basically the same as when I saw him 2 weeks ago. Very adherent fibrinous debris on the wound surface. Patient reports [name] lot of drainage as well. The character of this wound  has changed completely although it has always been refractory. We have been using Iodoflex, patient changed back to alginate because of the drainage. Area on his right dorsal foot really looks benign with [name] healthier surface certainly [name] lot better than on the left. Left buttock wounds all improved using Hydrofera Blue 12/7; left dorsal foot again no improvement. Tightly adherent debris. PCR culture I did last week only showed likely skin contaminant. I have gone ahead and done [name] punch biopsy of this which is about the last thing in terms of investigations I can think to do. He has known venous insufficiency and venous hypertension and this  could be the issue here. The area on the right foot is about the same left buttock slightly worse according to our intake nurse secondary to [facility] Blue sticking to the wound 12/14; biopsy of the left foot that I did last time showed changes that could be related to wound healing/chronic stasis dermatitis phenomenon no neoplasm. We have been using silver alginate to both feet. I change the one on the left today to Sorbact and silver alginate to his other 2 wounds 12/28; the patient arrives with the following problems; ooMajor issue is the dorsal left foot which continues to be [name] larger deeper wound area. Still with [name] completely nonviable surface ooParadoxically the area mirror image on the right on the right dorsal foot appears to be getting better. ooHe had some loss of dry denuded skin from the lower part of his original wound on the left lateral calf. Some of this area looked [name] little vulnerable and for this reason we put him in wrap that on this side this week ooThe area on his left buttock is larger. He still has the erythematous circular area which I think is [name] combination of pressure, sweat. This does not look like cellulitis or fungal dermatitis 11/26/2019; -Dorsal left foot large open wound with depth. Still debris over the surface. Using Sorbact ooThe area on the dorsal right foot paradoxically has closed over [facility] has [name] reopening on the left ankle laterally at the base of his original wound that extended up into the calf. This appears clean. ooThe left buttock wound is smaller but with very adherent necrotic debris over the surface. We have been using silver alginate here as well The patient had arterial studies done in 2017. He had biphasic waveforms at the dorsalis pedis and posterior tibial bilaterally. ABI in the left was 1.17. Digit waveforms were dampened. He has slight spasticity in the great toes I do not think [name] TBI would be possible 1/11; the patient comes in today with  [name] sizable reopening between the first and second toes on the right. This is not exactly in the same location where we have been treating wounds previously. According to our intake nurse this was actually fairly deep but 0.6 cm. The area on the left dorsal foot looks about the same the surface is somewhat cleaner using Sorbact, his MRI is in 2 days. We have not managed yet to get arterial studies. The new reopening on the left lateral calf looks somewhat better using alginate. The left buttock wound is about the same using alginate 1/18; the patient had his ARTERIAL studies which were quite normal. ABI in the right at 1.13 with triphasic/biphasic waveforms on the left ABI 1.06 again with triphasic/biphasic waveforms. It would not have been possible to have done [name] toe brachial index because of spasticity. We have been using Sorbac to the left foot  alginate to the rest of his wounds on the right foot left lateral calf and left buttock 1/25; arrives in clinic with erythema and swelling of the left forefoot worse over the first MTP area. This extends laterally dorsally and but also posteriorly. Still has an area on the left lateral part of the lower part of his calf wound it is eschared and clearly not closed. ooArea on the left buttock still with surrounding irritation and erythema. ooRight foot surface wound dorsally. The area between the right and first and second toes appears better. 2/1; ooThe left foot wound is about the same. Erythema slightly better I gave him [name] week of doxycycline empirically ooRight foot wound is more extensive extending between the toes to the plantar surface ooLeft lateral calf really no open surface on the inferior part of his original wound however the entire area still looks vulnerable ooAbsolutely no improvement in the left buttock wound required debridement. 2/8; the left foot is about the same. Erythema is slightly improved I gave him clindamycin last  week. ooRight foot looks better he is using Lotrimin and silver alginate ooHe has [name] breakdown in the left lateral calf. Denuded epithelium which I have removed ooLeft buttock about the same were using Hydrofera Blue 2/15; left foot is about the same there is less surrounding erythema. Surface still has tightly adherent debris which I have debriding however not making any progress ooRight foot has [name] substantial wound on the medial right second toe between the first and second webspace. ooStill an open area on the left lateral calf distal area. ooButtock wound is about the same 2/22; left foot is about the same less surrounding erythema. Surface has adherent debris. Polymen Ag Right foot area significant wound between the first and second toes. We have been using silver alginate here Left lateral leg polymen Ag at the base of his original venous insufficiency wound ooLeft buttock some improvement here 3/1; ooRight foot is deteriorating in the first second toe webspace. Larger and more substantial. We have been using silver alginate. ooLeft dorsal foot about the same markedly adherent surface debris using PolyMem Ag ooLeft lateral calf surface debris using PolyMem AG ooLeft buttock is improved again using PolyMem Ag. ooHe is completing his terbinafine. The erythema in the foot seems better. He has been on this for 2 weeks 3/8; no improvement in any wound area in fact he has [name] small open area on the dorsal midfoot which is new this week. He has not gotten his foot x-rays yet 3/15; his x-rays were both negative for osteomyelitis of both feet. No major change in any of his wounds on the extremities however his buttock wounds are better. We have been using polymen on the buttocks, left lower leg. Iodoflex on the left foot and silver alginate on the right 3/22; arrives in clinic today with the 2 major issues are the improvement in the left dorsal foot wound which for once actually looks  healthy with [name] nice healthy wound surface without debridement. Using Iodoflex here. Unfortunately on the left lateral calf which is in the distal part of his original wound he came to the clinic here for there was purulent drainage noted some increased breakdown scattered around the original area and [name] small area proximally. We we are using polymen here will change to silver alginate today. His buttock wound on the left is better and I think the area on the right first second toe webspace is also improved 3/29; left dorsal foot looks  better. Using Iodoflex. Left ankle culture from deterioration last time grew E. coli, Enterobacter and Enterococcus. I will give him [name] course of cefdinir although that will not cover Enterococcus. The area on the right foot in the webspace of the first and second toe lateral first toe looks better. The area on his buttock is about healed Vascular appointment is on April 21. This is to look at his venous system vis--vis continued breakdown of the wounds on the left including the left lateral leg and left dorsal foot he. He has had previous ablations on this side 4/5; the area between the right first and second toes lateral aspect of the first toe looks better. Dorsal aspect of the left first toe on the left foot also improved. Unfortunately the left lateral lower leg is larger and there is [name] second satellite wound superiorly. The usual superficial abrasions on the left buttock overall better but certainly not closed 4/12; the area between the right first and second toes is improved. Dorsal aspect of the left foot also slightly smaller with [name] vibrant healthy looking surface. No real change in the left lateral leg and the left buttock wound is healed He has an unaffordable co-pay for Apligraf. Appointment with vein and vascular with regards to the left leg venous part of the circulation is on 4/21 4/19; we continue to see improvement in all wound areas. Although this is  minor. He has his vascular appointment on 4/21. The area on the left buttock has not reopened although right in the center of this area the skin looks somewhat threatened 4/26; the left buttock is unfortunately reopened. In general his left dorsal foot has [name] healthy surface and looks somewhat smaller although it was not measured as such. The area between his first and second toe webspace on the right as [name] small wound against the first toe. The patient saw vascular surgery. The real question I was asking was about the small saphenous vein on the left. He has previously ablated left greater saphenous vein. Nothing further was commented on on the left. Right greater saphenous vein without reflux at the saphenofemoral junction or proximal thigh there was no indication for ablation of the right greater saphenous vein duplex was negative for DVT bilaterally. They did not think there was anything from [name] point of view that could be offered. They ABIs within normal limits 5/3; only small open area on the left buttock. The area on the left lateral leg which was his original venous reflux is now 2 wounds both which look clean. We are using Iodoflex on the left dorsal foot which looks healthy and smaller. He is down to [name] very tiny area between the right first and second toes, using silver alginate 5/10; all of his wounds appear better. We have much better edema control in 4 layer compression on the left. This may be the factor that is allowing the left foot and left lateral calf to heal. He has external compression garments at home 04/14/20-All of his wounds are progressing well, the left forefoot is practically closed, left ischium appears to be about the same, right toe webspace is also smaller. The left lateral leg is about the same, continue using Hydrofera Blue to this, silver alginate to the ischium, Iodoflex to the toe space on the right 6/7; most of his wounds outside of the left buttock  are doing well. The area on the left lateral calf and left dorsal foot are smaller. The area on  the right foot in between the first and second toe webspace is barely visible although he still says there is some drainage here is the only reason I did not heal this out. ooUnfortunately the area on the left buttock almost looks like he has [name] skin tear from tape. He has open wound and then [name] large flap of skin that we are trying to get adherence over an area just next to the remaining wound 6/21; 2 week follow-up. I believe is been here for nurse visits. Miraculously the area between his first and second toes on the left dorsal foot is closed over. Still open on the right first second web space. The left lateral calf has 2 open areas. Distally this is more superficial. The proximal area had [name] little more depth and required debridement of adherent necrotic material. His buttock wound is actually larger we have been using silver alginate here 6/28; the patient's area on the left foot remains closed. Still open wet area between the first and second toes on the right and also extending into the plantar aspect. We have been using silver alginate in this location. He has 2 areas on the left lower leg part of his original long wounds which I think are better. We have been using Hydrofera Blue here. Hydrofera Blue to the left buttock which is stable 7/12; left foot remains closed. Left ankle is closed. May be [name] small area between his right first and second toes the only truly open area is on the left buttock. We have been using Hydrofera Blue here 7/19; patient arrives with marked deterioration especially in the left foot and ankle. We did not put him in [name] compression wrap on the left last week in fact he wore his juxta lite stockings on either side although he does not have an underlying stocking. He has [name] reopening on the left dorsal foot, left lateral ankle and [name] new area on the right dorsal ankle. More  worrisome is the degree of erythema on the left foot extending on the lateral foot into the lateral lower leg on the left 7/26; the patient had erythema and drainage from the lateral left ankle last week. Culture of this grew MRSA resistant to doxycycline and clindamycin which are the 2 antibiotics we usually use with this patient who has multiple antibiotic allergies including linezolid, trimethoprim sulfamethoxazole. I had give him an empiric doxycycline and he comes in the area certainly looks somewhat better although it is blotchy in his lower leg. He has not been systemically unwell. He has had areas on the left dorsal foot which is [name] reopening, chronic wounds on the left lateral ankle. Both of these I think are secondary to chronic venous insufficiency. The area between his first and second toes is closed as far as I can tell. He had [name] new wrap injury on the right dorsal ankle last week. Finally he has an area on the left buttock. We have been using silver alginate to everything except the left buttock we are using Hydrofera Blue 06/30/20-Patient returns at 1 week, has been given [name] sample dose pack of NUZYRA which is [name] tetracycline derivative [omadacycline], patient has completed those, we have been using silver alginate to almost all the wounds except the left ischium where we are using Hydrofera Blue all of them look better 8/16; since I last saw the patient he has been doing well. The area on the left buttock, left lateral ankle and left foot are all closed today.  He has completed the [place] I gave him last time and tolerated this well. He still has open areas on the right dorsal ankle and in the right first second toe area which we are using silver alginate. 8/23; we put him in his bilateral external compression stockings last week as he did not have anything open on either leg except for concerning area between the right first and second toe. He comes in today with an area on the left dorsal  foot slightly more proximal than the original wound, the left lateral foot but this is actually [name] continuation of the area he had on the left lateral ankle from last time. As well he is opened up on the left buttock again. 8/30; comes in today with things looking [name] lot better. The area on the left lower ankle has closed down as has the left foot but with eschar in both areas. The area on the dorsal right ankle is also epithelialized. Very little remaining of the left buttock wound. We have been using silver alginate on all wound areas 9/13; the area in the first second toe webspace on the right has fully epithelialized. He still has some vulnerable epithelium on the right and the ankle and the dorsal foot. He notes weeping. He is using his juxta lite stocking. On the left again the left dorsal foot is closed left lateral ankle is closed. We went to the juxta lite stocking here as well. ooStill vulnerable in the left buttock although only 2 small open areas remain here 9/27; 2-week follow-up. We did not look at his left leg but the patient says everything is closed. He is [name] bit disturbed by the amount of edema in his left foot he is using juxta lite stockings but asking about over the toes stockings which would be 30/40, will talk to him next time. According to him there is no open wound on either the left foot or the left ankle/calf He has an open area on the dorsal right calf which I initially point [name] wrap injury. He has superficial remaining wound on the left ischial tuberosity been using silver alginate although he says this sticks to the wound 10/5; we gave him 2-week follow-up but he called yesterday expressing some concerns about his right foot right ankle and the left buttock. He came in early. There is still no open areas on the left leg and that still in his juxta lite stocking 10/11; he only has 1 small area on the left buttock that remains measuring millimeters 1 mm. Still has the same  irritated skin in this area. We recommended zinc oxide when this eventually closes and pressure relief is meticulously is he can do this. He still has an area on the dorsal part of his right first through third toes which is [name] bit irritated and still open and on the dorsal ankle near the crease of the ankle. We have been using silver alginate and using his own stocking. He has nothing open on the left leg or foot 10/25; 2-week follow-up. Not nearly as good on the left buttock as I was hoping. For open areas with 5 looking threatened small. He has the erythematous irritated chronic skin in this area. oo1 area on the right dorsal ankle. He reports this area bleeds easily ooRight dorsal foot just proximal to the base of his toes ooWe have been using silver alginate. 11/8; 2-week follow-up. Left buttock is about the same although I do not think the wounds  are in the same location we have been using silver alginate. I have asked him to use zinc oxide on the skin around the wounds. ooHe still has [name] small area on the right dorsal ankle he reports this bleeds easily ooRight dorsal foot just proximal to the base of the toes does not have anything open although the skin is very dry and scaly ooHe has [name] new opening on the nailbed of the left great toe. Nothing on the left ankle 11/29; 3-week follow-up. Left buttock has 2 open areas. And washing of these wounds today started bleeding easily. Suggesting very friable tissue. We have been using silver alginate. Right dorsal ankle which I thought was initially [name] wrap injury we have been using silver alginate. Nothing open between the toes that I can see. He states the area on the left dorsal toe nailbed healed after the last visit in 2 or 3 days 12/13; 3-week follow-up. His left buttock now has 3 open areas but the original 2 areas are smaller using polymen here. Surrounding skin looks better. The right dorsal ankle is closed. He has [name] small opening on the  right dorsal foot at the level of the third toe. In general the skin looks better here. He is wearing his juxta lite stocking on the left leg says there is nothing open 11/24/2020; 3 weeks follow-up. His left buttock still has the 3 open areas. We have been using polymen but due to lack of response he changed to [facility] area. Surrounding skin is dry erythematous and irritated looking. There is no evidence of infection either bacterial or fungal however there is loss of surface epithelium ooHe still has very dry skin in his foot causing irritation and erythema on the dorsal part of his toes. This is not responded to prolonged courses of antifungal simply looks dry and irritated 1/24; left buttock area still looks about the same he was unable to find the triad ointment that we had suggested. The area on the right lower leg just above the dorsal ankle has reopened and the areas on the right foot between the first second and second third toes and scaling on the bottom of the foot has been about the same for quite some time now. been using silver alginate to all wound areas 2/7; left buttock wound looked quite good although not much smaller in terms of surface area surrounding skin looks better. Only [name] few dry flaking areas on the right foot in between the first and second toes the skin generally looks better here [ammonium lactate]. Finally the area on the right dorsal ankle is closed 2/21; ooThere is no open area on the right foot even between the right first and second toe. Skin around this area dorsally and plantar aspects look better. ooHe has [name] reopening of the area on the right ankle just above the crease of the ankle dorsally. I continue to think that this is probably friction from spasms may be even this time with his stocking under the compression stockings. ooWounds on his left buttock look about the same there [name] couple of areas that have reopened. He has [name] total square area of loss of  epithelialization. This does not look like infection it looks like [name] contact dermatitis but I just cannot determine to what 3/14; there is nothing on the right foot between the first and second toes this was carefully inspected under illumination. Some chronic irritation on the dorsal part of his foot from toes 1-3 at  the base. Nothing really open here substantially. Still has an area on the right foot/ankle that is actually larger and hyper granulated. His buttock area on the left is just about closed however he has chronic inflammation with loss of the surface epithelial layer 3/28; 2-week follow-up. In clinic today with [name] new wound on the left anterior mid tibia. Says this happened about 2 weeks ago. He is not really sure how wonders about the spasticity of his legs at night whether that could have caused this other than that he does not have [name] good idea. He has been using topical antibiotics and silver alginate. The area on his right dorsal ankle seems somewhat better. ooFinally everything on his left buttock is closed. 4/11; 2-week follow-up. All of his wounds are better except for the area over the ischium and left buttock which have opened up widely again. At least part of this is covered in necrotic fibrinous material another part had rolled nonviable skin. The area on the right ankle, left anterior mid tibia are both [name] lot better. He had no open wounds on either foot including the areas between the first and second toes 4/25; patient presents for 2-week follow-up. He states that the wounds are overall stable. He has no complaints today and states he is using Hydrofera Blue to open wounds. 5/9; have not seen this man in over [name] month. For my memory he has open areas on the left mid tibia and right ankle. T oday he has new open area on the right dorsal foot which we have not had [name] problem with recently. He has the sustained area on the left buttock He is also changed his insurance at the  beginning of the year [employer]. We will need prior authorizations for debridement 5/23; patient presents for 2-week follow-up. He has prior authorizations for debridement. He denies any issues in the past 2 weeks with his wound care. He has been using Hydrofera Blue to all the wounds. He does report [name] circular rash to the upper left leg that is new. He denies acute signs of infection. 6/6; 2-week follow-up. The patient has open wounds on the left buttock which are worse than the last time I saw this about [name] month ago. He also has [name] new area to me on the left anterior mid tibia with some surrounding erythema. The area on the dorsal ankle on the right is closed but I think this will be [name] friction injury every time this area is exposed to either our wraps or his compression stockings caused by unrelenting spasms in this leg. 6/20; 2-week follow-up. oo The patient has open wounds on the left buttock which is about the same. Using [facility] here. - The left mid tibia has [name] static amount of surrounding erythema. Also [name] raised area in the center. We have been using Hydrofera Blue here. ooooFinally he has broken down in his dorsal right foot extending between the first and second toes and going to the base of the first and second toe webspace. I have previously assumed that this was severe venous hypertension 7/5; 2-week follow-up oo The left buttock wound actually looks better. We are using Hydrofera Blue. He has extensive skin irritation around this area and I have not really been able to get that any better. I have tried Lotrisone i.e. antifungals and steroids. More most recently we have just been using Coloplast really looks about the same. oo The left mid tibia which was new last week  culture to have very resistant staph aureus. Not only methicillin-resistant but doxycycline resistant. The patient has [name] plethora of antibiotic allergies including sulfa, linezolid. I used topical bacitracin  on this but he has not started this yet. oo In addition he has an expanding area of erythema with [name] wound on the dorsal right foot. I did [name] deep tissue culture of this area today 7/12; oo Left buttock area actually looks better surrounding skin also looks less irritated. oo Left mid tibia looks about the same. He is using bacitracin this is not worse oo Right dorsal foot looks about the same as well. oo The left first toe also looks about the same 7/19; left buttock wound continues to improve in terms of open areas oo Left mid tibia is still concerning amount of swelling he is using bacitracin oo Dorsal left first toe somewhat smaller oo Right dorsal foot somewhat smaller 7/25; left buttock wound actually continues to improve oo Left mid tibia area has less swelling. I gave him all my samples of new Nuzyra. This seems to have helped although the wound is still open it. His abrasion closed by here oo Left dorsal great toe really no better. Still [name] very nonviable surface oo Right dorsal foot perhaps some better. oo We have been using bacitracin and silver nitrate to the areas on his lower legs and Hydrofera Blue to the area on the buttock. 8/16 oo Disappointed that his left buttock wound is actually more substantial. Apparently during the last nurse visit these were both very small. He has continued irritation to [name] large area of skin on his buttock. I have never been able to totally explain this although I think it some combination of the way he sits, pressure, moisture. He is not incontinent enough to contribute to this. oo Left dorsal great toe still fibrinous debris on the surface that I have debrided today oo Large area across the dorsal right toes. oo The area on the left anterior mid tibia has less swelling. He completed the [place]. This does not look infected although the tissue is still fried 8/30; 2-week follow-up. oo Left buttock areas not improved. We used Hydrofera  Blue on this. Weeping wet with the surrounding erythema that I have not been able to control even with Lotrisone and topical Coloplast oo Left dorsal great toe looks about the same oo More substantial area again at the base of his toes on the left which is new this week. oo Area across the dorsal right toes looks improved oo The left anterior mid tibia looks like it is trying to close 9/13; 2-week follow-up. Using silver alginate on all of his wounds. The left dorsal foot does not look any better. He has the area on the dorsal toe and also the areas at the base of all of his toes 1 through 3. On the right foot he has [name] similar pattern in [name] similar area. He has the area on his left mid tibia that looks fairly healthy. Finally the area on his left buttock looks somewhat bette 9/20; culture I did of the left foot which was [name] deep tissue culture last time showed E. coli he has erythema around this wound. Still [name] completely necrotic surface. His right dorsal foot looks about the same. He has [name] very friable surface to the left anterior mid tibia. Both buttock wounds look better. We have been using silver alginate to all wounds 10/4; he has completed the cephalexin that I gave him last  time for the left foot. He is using topical gentamicin under silver alginate silver alginate being applied to all the wounds. Unfortunately all the wounds look irritated on his dorsal right foot dorsal left foot left mid tibia. I wonder if this could be [name] silver allergy. I am going to change him to [facility] on the lower extremity. The skin on the left buttock and left posterior thigh still flaking dry and irritated. This has continued no matter what I have applied topically to this. He has [name] solitary open wound which by itself does not look too bad however the entire area of surrounding skin does not change no matter what we have applied here 10/18; the area on the left dorsal foot and right dorsal foot both look  better. The area on the right extends into the plantar but not between his toes. We have been using silver alginate. He still has [name] rectangular erythematous area around the area on the left tibia. The wound itself is very small. Finally everything on his left buttock looks [name] little larger the skin is erythematous 11/15; patient comes in with the left dorsal and right dorsal foot distally looking somewhat better. Still nonviable surface on the left foot which required debridement. He still has the area on the left anterior mid tibia although this looks somewhat better. He has [name] new area on the right lateral lower leg just above the ankle. Finally his left buttock looks terrible with multiple superficial open wounds geometric square shaped area of chronic erythema which I have not been able to sort through 11/29; right dorsal foot and left dorsal foot both look somewhat better. No debridement required. He has the fragile area on the left anterior mid tibia this looks and continues to look somewhat better. Right lateral lower leg just above the ankle we identified last time also looks better. In general the area on his left buttock looks improved. We are using Hydrofera Blue to all wound areas 12/13; right dorsal foot looks better. The area on the right lateral leg is healed. Left dorsal foot has 2 open areas both of which require debridement. The fragile area on the left anterior mid tibial looks better. Smaller area on his buttocks. Were using Hydrofera Blue 1/10; patient comes in with everything looking slightly larger and/or worse. This includes his left buttock, reopening of the left mid tibia, larger areas on the left dorsal foot and what looks to be [name] cellulitis on the right dorsal and plantar foot. We have been using Hydrofera Blue on all wounds. Objective Constitutional Sitting or standing Blood Pressure is within target range for patient.. Pulse regular and within target range for patient.Marland Kitchen  Respirations regular, non-labored and within target range.. Temperature is normal and within the target range for the patient.Marland Kitchen Appears in no distress. Vitals Time Taken: 7:57 AM, Height: 70 in, Weight: 216 lbs, BMI: 31, Temperature: 97.7 F, Pulse: 120 bpm, Respiratory Rate: 18 breaths/min, Blood Pressure: 135/72 mmHg. General Notes: Wound exam; oo Right dorsal foot linear wound stretching across the metatarsal heads not much length. Marked erythema around this which seems to also be present in the dorsal foot. Palpation reveals marked spasm of the right foot even on the plantar aspect where there is no wound. This suggests cellulitis to me wound oo Left dorsal foot 2 open areas starting between the first and second toes very fibrinous nonviable debris on the surface debrided with [name] #5 curette and then [name] deep tissue culture done oo  There is been [name] reopening on the left mid tibia oo Skin surrounding multiple open areas on the left buttock looks very inflamed angry loss of surface epithelium. I did [name] shave biopsy around one of the wound areas. I am looking for eczema, tinea. Integumentary (Hair, Skin) Wound #41R status is Open. Original cause of wound was Gradually Appeared. The date acquired was: 03/16/2020. The wound has been in treatment 89 weeks. The wound is located on the Left Ischium. The wound measures 7.6cm length x 4.4cm width x 0.1cm depth; 26.264cm^2 area and 2.626cm^3 volume. There is Fat Layer (Subcutaneous Tissue) exposed. There is [name] medium amount of sanguinous drainage noted. The wound margin is distinct with the outline attached to the wound base. There is large (67-100%) red, friable granulation within the wound bed. There is no necrotic tissue within the wound bed. Wound #51R status is Open. Original cause of wound was Trauma. The date acquired was: 01/26/2021. The wound has been in treatment 41 weeks. The wound is located on the Left,Anterior Lower Leg. The wound measures 1.1cm  length x 0.6cm width x 0.1cm depth; 0.518cm^2 area and 0.052cm^3 volume. There is Fat Layer (Subcutaneous Tissue) exposed. There is no tunneling or undermining noted. There is [name] medium amount of serosanguineous drainage noted. The wound margin is flat and intact. There is large (67-100%) red granulation within the wound bed. There is no necrotic tissue within the wound bed. Wound #52 status is Open. Original cause of wound was Gradually Appeared. The date acquired was: 03/27/2021. The wound has been in treatment 35 weeks. The wound is located on the Right,Dorsal Foot. The wound measures 1.2cm length x 5.3cm width x 0.1cm depth; 4.995cm^2 area and 0.5cm^3 volume. There is Fat Layer (Subcutaneous Tissue) exposed. There is no tunneling or undermining noted. There is [name] medium amount of serosanguineous drainage noted. The wound margin is distinct with the outline attached to the wound base. There is small (1-33%) pink, pale granulation within the wound bed. There is [name] small (1- 33%) amount of necrotic tissue within the wound bed including Adherent Slough. Wound #56 status is Open. Original cause of wound was Gradually Appeared. The date acquired was: 07/11/2021. The wound has been in treatment 19 weeks. The wound is located on the Left,Dorsal Foot. The wound measures 4.4cm length x 1.6cm width x 0.1cm depth; 5.529cm^2 area and 0.553cm^3 volume. There is Fat Layer (Subcutaneous Tissue) exposed. There is no tunneling or undermining noted. There is [name] medium amount of serosanguineous drainage noted. The wound margin is distinct with the outline attached to the wound base. There is small (1-33%) pink, pale granulation within the wound bed. There is [name] large (67- 100%) amount of necrotic tissue within the wound bed including Adherent Slough. Wound #57 status is Open. Original cause of wound was Gradually Appeared. The date acquired was: 08/25/2021. The wound has been in treatment 14 weeks. The wound is located on the  [place]. The wound measures 1.4cm length x 3cm width x 0.1cm depth; 3.299cm^2 area and 0.33cm^3 volume. There is Fat Layer (Subcutaneous Tissue) exposed. There is no tunneling or undermining noted. There is [name] medium amount of serosanguineous drainage noted. The wound margin is distinct with the outline attached to the wound base. There is medium (34-66%) pink granulation within the wound bed. There is [name] medium (34- 66%) amount of necrotic tissue within the wound bed including Adherent Slough. Wound #59 status is Converted. Original cause of wound was Pressure Injury. The date acquired was:  11/03/2021. The wound has been in treatment 4 weeks. The wound is located on the Left,Proximal,Dorsal Foot. The wound measures 0cm length x 0cm width x 0cm depth; 0cm^2 area and 0cm^3 volume. There is [name] medium amount of serosanguineous drainage noted. 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 There was [name] Excisional Skin/Subcutaneous Tissue Debridement with [name] total area of 7.04 sq cm performed by Ricard Dillon., MD. With the following instrument(s): Curette to remove Viable and Non-Viable tissue/material. Material removed includes Subcutaneous Tissue and Slough and. No specimens were taken. [name] time out was conducted at 08:30, prior to the start of the procedure.  [name] Minimum amount of bleeding was controlled with Pressure. The procedure was tolerated well with [name] pain level of 0 throughout and [name] pain level of 0 following the procedure. Post Debridement Measurements: 4.4cm length x 1.6cm width x 0.1cm depth; 0.553cm^3 volume. Character of Wound/Ulcer Post Debridement is improved. Post procedure Diagnosis Wound #56: Same as Pre-Procedure Wound #41R Pre-procedure diagnosis of Wound #41R is [name] Pressure Ulcer located on the Left Ischium . There was [name] biopsy performed by Ricard Dillon., MD. There was [name] biopsy performed on Wound Bed. The skin was cleansed and prepped with anti-septic. Tissue was removed at its base with the following instrument(s): Blade and Forceps and sent to pathology. [name] Moderate amount of bleeding was controlled with Silver Nitrate. [name] time out was conducted at 08:40, prior to the start of the procedure. The procedure was tolerated well with [name] pain level of 0 throughout and [name] pain level of 0 following the procedure. Post procedure Diagnosis Wound #41R: Same as Pre-Procedure Plan Follow-up Appointments: Return Appointment in 1 week. - Dr. Dellia Nims Bathing/ Shower/ Hygiene: May shower and wash wound with soap and water. - on days that dressing is changed Edema Control - Lymphedema / SCD / Other: Elevate legs to the level of the heart or above for 30 minutes daily and/or when sitting, [name] frequency of: - throughout the day Compression stocking or Garment 30-40 mm/Hg pressure to: - Juxtalite to both legs daily Off-Loading: Roho cushion for wheelchair Turn and reposition every 2 hours Laboratory ordered were: Anerobic culture - Left dorsal foot, Biopsy specimen culture - Left Ischium The following medication(s) was prescribed: doxycycline monohydrate oral 100 mg capsule 1 capsule oral bid for 7 days for cellulitis right foot starting 12/01/2021 WOUND #41R: - Ischium Wound Laterality: Left Cleanser: Soap and Water Every Other Day/30  Days Discharge Instructions: May shower and wash wound with dial antibacterial soap and water prior to dressing change. Peri-Wound Care: Triad Hydrophilic Wound Dressing Tube, 6 (oz) Every Other Day/30 Days Discharge Instructions: Apply to periwound with each dressing change Prim Dressing: Hydrofera Blue Ready Foam, 2.5 x2.5 in Every Other Day/30 Days ary Discharge Instructions: Apply to wound bed as instructed Secondary Dressing: ABD Pad, 5x9 Every Other Day/30 Days Discharge Instructions: Apply over primary dressing as directed. Secured With: [employer]  Medipore H Soft Cloth Surgical T 4 x 2 (in/yd) Every Other Day/30 Days ape Discharge Instructions: Secure dressing with tape as directed. WOUND #51R: - Lower Leg Wound Laterality: Left, Anterior Cleanser: Soap and Water Every Other Day/30 Days Discharge Instructions: May shower and wash wound with dial antibacterial soap and water prior to dressing change. Prim Dressing: Hydrofera Blue Ready Foam, 2.5 x2.5 in Every Other Day/30 Days ary Discharge Instructions: Apply to wound bed as instructed Secondary Dressing: Zetuvit Plus Silicone Border Dressing 4x4 (in/in) Every Other Day/30 Days Discharge Instructions: Apply silicone border over primary dressing as directed. Secured With: 66M Medipore H Soft Cloth Surgical T 4 x 2 (in/yd) Every Other Day/30 Days ape Discharge Instructions: Secure dressing with tape as directed. WOUND #52: - Foot Wound Laterality: Dorsal, Right Cleanser: Soap and Water Every Other Day/30 Days Discharge Instructions: May shower and wash wound with dial antibacterial soap and water prior to dressing change. Prim Dressing: Hydrofera Blue Ready Foam, 2.5 x2.5 in Every Other Day/30 Days ary Discharge Instructions: Apply to wound bed as instructed Secondary Dressing: Woven Gauze Sponge, Non-Sterile 4x4 in Every Other Day/30 Days Discharge Instructions: Apply over primary dressing as directed. Secured With: [employer],  4.5x3.1 (in/yd) Every Other Day/30 Days Discharge Instructions: Secure with Kerlix as directed. Secured With: Transpore Surgical T ape, 2x10 (in/yd) Every Other Day/30 Days Discharge Instructions: Secure dressing with tape as directed. WOUND #56: - Foot Wound Laterality: Dorsal, Left Cleanser: Soap and Water Every Other Day/30 Days Discharge Instructions: May shower and wash wound with dial antibacterial soap and water prior to dressing change. Prim Dressing: Hydrofera Blue Classic Foam, 4x4 in Every Other Day/30 Days ary Discharge Instructions: Moisten with saline prior to applying to wound bed Secondary Dressing: Woven Gauze Sponge, Non-Sterile 4x4 in Every Other Day/30 Days Discharge Instructions: Apply over primary dressing as directed. Secured With: [employer], 4.5x3.1 (in/yd) Every Other Day/30 Days Discharge Instructions: Secure with Kerlix as directed. Secured With: Transpore Surgical T ape, 2x10 (in/yd) Every Other Day/30 Days Discharge Instructions: Secure dressing with tape as directed. WOUND #57: - Foot Wound Laterality: Plantar, Right Cleanser: Soap and Water Every Other Day/30 Days Discharge Instructions: May shower and wash wound with dial antibacterial soap and water prior to dressing change. Prim Dressing: Hydrofera Blue Ready Foam, 2.5 x2.5 in Every Other Day/30 Days ary Discharge Instructions: Apply to wound bed as instructed Secondary Dressing: Woven Gauze Sponge, Non-Sterile 4x4 in Every Other Day/30 Days Discharge Instructions: Apply over primary dressing as directed. Secured With: [employer], 4.5x3.1 (in/yd) Every Other Day/30 Days Discharge Instructions: Secure with Kerlix as directed. Secured With: 66M Medipore H Soft Cloth Surgical T 4 x 2 (in/yd) Every Other Day/30 Days ape Discharge Instructions: Secure dressing with tape as directed. 1. Empiric doxycycline 100 twice daily for 7 days for potential cellulitis of the right foot. There were no  available cultures that I thought would be helpful in this area. 2. Deep tissue culture postdebridement of the left dorsal foot wounds. 3. Shave biopsy of the skin around an open area on the left buttock. I do not understand why this is unilateral Electronic Signature(s) Signed: 12/01/2021 4:28:08 PM By: Linton Ham MD Entered By: Linton Ham on 12/01/2021 09:06:21 -------------------------------------------------------------------------------- SuperBill Details Patient Name: Date of Service: [name] [name] [name] 12/01/2021 Medical Record Number: [id number] Patient Account Number: [id number] Date of Birth/Sex: Treating RN: 07/19/1986 (36 y.o. [name] Primary Care Provider: [name], [name] Other Clinician: Referring Provider: Treating Provider/Extender:  [name]: 308 Diagnosis Coding ICD-10 Codes Code Description I87.332 Chronic venous hypertension (idiopathic) with ulcer and inflammation of left lower extremity L97.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, stage 3 L97.821 Non-pressure chronic ulcer of other part of left lower leg limited to breakdown of skin L97.518 Non-pressure chronic ulcer of other part of right foot with other 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, 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 Non-pressure chronic ulcer of other part of left foot limited to breakdown of skin L03.115 Cellulitis of right lower limb Facility Procedures CPT4 Code: [id number] 1 Description: 1042 - DEB SUBQ TISSUE 20 SQ CM/< ICD-10 Diagnosis Description L97.521 Non-pressure chronic ulcer of other part of left foot limited to breakdown of skin Modifier: Quantity: 1 CPT4 Code: [id number] 1 Description: 1102-Tangential biopsy of skin (e.g., shave, scoop, saucerize, curette) single lesion ICD-10 Diagnosis Description L89.323 Pressure ulcer of left buttock, stage 3 Modifier: 59 Quantity:  1 Physician Procedures : CPT4 Code Description Modifier [id number] 11042 - WC PHYS SUBQ TISS 20 SQ CM ICD-10 Diagnosis Description L97.521 Non-pressure chronic ulcer of other part of left foot limited to breakdown of skin Quantity: 1 : 11102 Tangential biopsy of skin (e.g., shave, scoop, saucerize, curette) single lesion 59 ICD-10 Diagnosis Description L89.323 Pressure ulcer of left buttock, stage 3 Quantity: 1 Electronic Signature(s) Signed: 12/01/2021 4:28:08 PM By: Linton Ham MD Signed: 12/01/2021 5:00:37 PM By: Levan Hurst RN, BSN Entered By: Levan Hurst on 12/01/2021 11:52:06

## 2021-12-06 LAB — AEROBIC/ANAEROBIC CULTURE W GRAM STAIN (SURGICAL/DEEP WOUND)

## 2021-12-08 ENCOUNTER — Other Ambulatory Visit: Payer: Self-pay

## 2021-12-08 ENCOUNTER — Encounter (HOSPITAL_BASED_OUTPATIENT_CLINIC_OR_DEPARTMENT_OTHER): Payer: BC Managed Care – PPO | Admitting: Internal Medicine

## 2021-12-08 DIAGNOSIS — G8221 Paraplegia, complete: Secondary | ICD-10-CM | POA: Diagnosis not present

## 2021-12-08 DIAGNOSIS — L89323 Pressure ulcer of left buttock, stage 3: Secondary | ICD-10-CM | POA: Diagnosis not present

## 2021-12-08 DIAGNOSIS — L97511 Non-pressure chronic ulcer of other part of right foot limited to breakdown of skin: Secondary | ICD-10-CM | POA: Diagnosis not present

## 2021-12-08 DIAGNOSIS — L97512 Non-pressure chronic ulcer of other part of right foot with fat layer exposed: Secondary | ICD-10-CM | POA: Diagnosis not present

## 2021-12-08 DIAGNOSIS — L97518 Non-pressure chronic ulcer of other part of right foot with other specified severity: Secondary | ICD-10-CM | POA: Diagnosis not present

## 2021-12-08 DIAGNOSIS — L03115 Cellulitis of right lower limb: Secondary | ICD-10-CM | POA: Diagnosis not present

## 2021-12-08 DIAGNOSIS — L97521 Non-pressure chronic ulcer of other part of left foot limited to breakdown of skin: Secondary | ICD-10-CM | POA: Diagnosis not present

## 2021-12-08 DIAGNOSIS — L97822 Non-pressure chronic ulcer of other part of left lower leg with fat layer exposed: Secondary | ICD-10-CM | POA: Diagnosis not present

## 2021-12-08 DIAGNOSIS — I87333 Chronic venous hypertension (idiopathic) with ulcer and inflammation of bilateral lower extremity: Secondary | ICD-10-CM | POA: Diagnosis not present

## 2021-12-08 DIAGNOSIS — I87332 Chronic venous hypertension (idiopathic) with ulcer and inflammation of left lower extremity: Secondary | ICD-10-CM | POA: Diagnosis not present

## 2021-12-08 DIAGNOSIS — L97821 Non-pressure chronic ulcer of other part of left lower leg limited to breakdown of skin: Secondary | ICD-10-CM | POA: Diagnosis not present

## 2021-12-08 DIAGNOSIS — L97522 Non-pressure chronic ulcer of other part of left foot with fat layer exposed: Secondary | ICD-10-CM | POA: Diagnosis not present

## 2021-12-08 NOTE — Progress Notes (Addendum)
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 him aggressively for this although we have not made any improvement here. 08/11/17; left lateral leg. Surface continues to improve with the Iodoflex although we are not seeing much improvement in overall wound dimensions. Areas on his plantar left foot and right foot show no improvement. In fact the right foot looks somewhat worse 08/18/17; left lateral leg. We changed to [facility] Blue last week after a prolonged course of Iodoflex which helps get the surface better. It appears that the wound with is improved. Continue with difficult areas on the left dorsal first second and plantar first second on the right  09/01/17; patient arrives in clinic today having had a temperature of 103 yesterday. He was seen in the ER and [facility]. The patient was concerned he could have cellulitis again in the right leg however they diagnosed him with a UTI and he is now on Keflex. He has a history of cellulitis which is been recurrent and difficult but this is been in the left leg, in the past 5 use doxycycline. He does in and out catheterizations at home which are risk factors for UTI 09/08/17; patient will be completing his Keflex this weekend. The erythema on the left leg is considerably better. He has a new  wound today on the medial part of the right leg small superficial almost looks like a skin tear. He has worsening of the area on the right dorsal first and second toe. His major area on the left lateral leg is better. Using Hydrofera Blue on all areas 09/15/17; gradual reduction in width on the long wound in the left lateral leg. No debridement required. He also has wounds on the plantar aspect of his left first second toe web space and on the dorsal aspect of the right first second toe web space. 09/22/17; there continues to be very gradual improvements in the dimensions of the left lateral leg wound. He hasn't round erythematous spot with might be pressure on his wheelchair. There is no evidence obviously of infection no purulence no warmth He has a dry scaled area on the plantar aspect of the left first second toe Improved area on the dorsal right first second toe. 09/29/17; left lateral leg wound continues to improve in dimensions mostly with an is still a fairly long but increasingly narrow wound. He has a dry scaled area on the plantar aspect of his left first second toe web space Increasingly concerning area on the dorsal right first second toe. In fact I am concerned today about possible cellulitis around this wound. The areas extending up his second toe and although there is deformities here almost appears to abut on the nailbed. 10/06/17; left lateral leg wound continues to make very gradual progress. Tissue culture I did from the right first second toe dorsal foot last time grew MRSA and enterococcus which was vancomycin sensitive. This was not sensitive to clindamycin or doxycycline. He is allergic to Zyvox and sulfa we have therefore arrange for him to have dalvance infusion tomorrow. He is had this in the past and tolerated it well 10/20/17; left lateral leg wound continues to make decent progress. This is certainly reduced in terms of with there is advancing epithelialization.The  cellulitis in the right foot looks better although he still has a deep wound in the dorsal aspect of the first second toe web space. Plantar left first toe web space on the left I think is making some progress 10/27/17; left lateral leg wound continues to make decent progress. Advancing epithelialization.using Hydrofera Blue The right first second toe web space wound is better-looking using silver alginate Improvement in the left plantar first second toe web space. Again using silver alginate 11/03/17 left lateral leg wound continues to make decent progress albeit slowly. Using [facility] The right per second toe web space continues to be a very problematic looking punched out wound. I obtained a piece of tissue for deep culture I did extensively treated this for fungus. It is difficult to imagine that this is a pressure area as the patient states other than going outside he doesn't really wear shoes at home The  left plantar first second toe web space looked fairly senescent. Necrotic edges. This required debridement change to [facility] Blue to all wound areas 11/10/17; left lateral leg wound continues to contract. Using Hydrofera Blue On the right dorsal first second toe web space dorsally. Culture I did of this area last week grew MRSA there is not an easy oral option in this patient was multiple antibiotic allergies or intolerances. This was only a rare culture isolate I'm therefore going to use Bactroban under silver alginate On the left plantar first second toe web space. Debridement is required here. This is also unchanged 11/17/17; left lateral leg wound continues to contract using Hydrofera Blue this is no longer the major issue. The major concern here is the right first second toe web space. He now has an open area going from dorsally to the plantar aspect. There is now wound on the inner lateral part of the first toe. Not a very viable surface on this. There is erythema spreading  medially into the forefoot. No major change in the left first second toe plantar wound 11/24/17; left lateral leg wound continues to contract using Hydrofera Blue. Nice improvement today The right first second toe web space all of this looks a lot less angry than last week. I have given him clindamycin and topical Bactroban for MRSA and terbinafine for the possibility of underlining tinea pedis that I could not control with ketoconazole. Looks somewhat better The area on the plantar left first second toe web space is weeping with dried debris around the wound 12/01/17; left lateral leg wound continues to contract he Hydrofera Blue. It is becoming thinner in terms of with nevertheless it is making good improvement. The right first second toe web space looks less angry but still a large necrotic-looking wounds starting on the plantar aspect of the right foot extending between the toes and now extensively on the base of the right second toe. I gave him clindamycin and topical Bactroban for MRSA anterior benefiting for the possibility of underlying tinea pedis. Not looking better today The area on the left first/second toe looks better. Debrided of necrotic debris 12/05/17* the patient was worked in urgently today because over the weekend he found blood on his incontinence bad when he woke up. He was found to have an ulcer by his wife who does most of his wound care. He came in today for [place] to look at this. He has not had a history of wounds in his buttocks in spite of his paraplegia. 12/08/17; seen in follow-up today at his usual appointment. He was seen earlier this week and found to have a new wound on his buttock. We also follow him for wounds on the left lateral leg, left first second toe web space and right first second toe web space 12/15/17; we have been using Hydrofera Blue to the left lateral leg which has improved. The right first second toe web space has also improved. Left first second toe web  space plantar aspect looks stable. The left buttock has worsened using Santyl. Apparently the buttock has drainage 12/22/17; we have been using Hydrofera Blue to the left lateral leg which continues to improve now 2 small wounds separated by normal skin. He tells [place] he had a fever up to 100 yesterday he is prone to UTIs but has not noted anything different. He does in and out catheterizations. The area between the first and second toes today does not look good necrotic surface covered with what looks  to be purulent drainage and erythema extending into the third toe. I had gotten this to something that I thought look better last time however it is not look good today. He also has a necrotic surface over the buttock wound which is expanded. I thought there might be infection under here so I removed a lot of the surface with a #5 curet though nothing look like it really needed culturing. He is been using Santyl to this area 12/27/17; his original wound on the left lateral leg continues to improve using Hydrofera Blue. I gave him samples of Baxdella although he was unable to take them out of fear for an allergic reaction ["lump in his throat"].the culture I did of the purulent drainage from his second toe last week showed both enterococcus and a set Enterobacter I was also concerned about the erythema on the bottom of his foot although paradoxically although this looks somewhat better today. 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 We've been using silver alginate here. They came up today that he sleeps in a recliner, I'm not sure why but I asked him to stop this 01/03/18; his original wound we've been using Hydrofera Blue is now separated into 2 areas. Ulcer on his left buttock is better he is off the recliner and sleeping in bed Finally both wound areas between his first and second toes also looks some  better 01/10/18; his original wound on the left lateral leg is now separated into 2 wounds we've been using Hydrofera Blue Ulcer on his left buttock has some drainage. There is a small probing site going into muscle layer superiorly.using silver alginate -He arrives today with a deep tissue injury on the left heel The wound on the dorsal aspect of his first second toe on the left looks a lot betterusing silver alginate ketoconazole The area on the first second toe web space on the right also looks a lot bette 01/17/18; his original wound on the left lateral leg continues to progress using Hydrofera Blue Ulcer on his left buttock also is smaller surface healthier except for a small probing site going into the muscle layer superiorly. 2.4 cm of tunneling in this area DTI on his left heel we have only been offloading. Looks better than last week no threatened open no evidence of infection the wound on the dorsal aspect of the first second toe on the left continues to look like it's regressing we have only been using silver alginate and terbinafine orally The area in the first second toe web space on the right also looks to be a lot better using silver alginate and terbinafine I think this was prompted by tinea pedis 01/31/18; the patient was hospitalized in [place] last week apparently for a complicated UTI. He was discharged on cefepime he does in and out catheterizations. In the hospital he was discovered M I don't mild elevation of AST and ALT and the terbinafine was stopped.predictably the pressure ulcer on s his buttock looks betterusing silver alginate. The area on the left lateral leg also is better using Hydrofera Blue. The area between the first and second toes on the left better. First and second toes on the right still substantial but better. Finally the DTI on the left heel has held together and looks like it's resolving 02/07/18-he is here in follow-up evaluation for multiple ulcerations.  He has new injury to the lateral aspect of  the last issue a pressure ulcer, he states this is from adhesive removal trauma. He states he has tried multiple adhesive products with no success. All other ulcers appear stable. The left heel DTI is resolving. We will continue with same treatment plan and follow-up next week. 02/14/18; follow-up for multiple areas. He has a new area last week on the lateral aspect of his pressure ulcer more over the posterior trochanter. The original pressure ulcer looks quite stable has healthy granulation. 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 Almost fully epithelialized on the original superior area using Hydrofera Blue DTI on the left heel has peeled off this week to reveal a small superficial wound under denuded skin and subcutaneous tissue Both areas between the first and second toes look better including nothing open on the left 02/21/18; The patient's wounds on his left ischial tuberosity and posterior left greater trochanter actually looked better. He has a large area of irritation around the area which I think is contact dermatitis. I am doubtful that this is fungal His original wound on the left lateral calf continues to improve we have been using Hydrofera Blue There is no open area in the left first second toe web space although there is a lot of thick callus The DTI on the left heel required debridement today of necrotic surface eschar and subcutaneous tissue using silver alginate Finally the area on the right first second toe webspace continues to contract using silver alginate and ketoconazole 02/28/18 Left ischial tuberosity wounds look better using silver alginate. Original wound on the left calf only has one small open area left using Hydrofera Blue DTI on the left heel required debridement mostly removing skin from around this wound surface. Using  silver alginate The areas on the right first/second toe web space using silver alginate and ketoconazole 03/08/18 on evaluation today patient appears to be doing decently well as best I can tell in regard to his wounds. This is the first time that I have seen him as he generally is followed by Dr. Dellia Nims. With that being said none of his wounds appear to be infected he does have an area where there is some skin covering what appears to be a new wound on the left dorsal surface of his great toe. This is right at the nail bed. With that being said I do believe that debrided away some of the excess skin can be of benefit in this regard. Otherwise he has been tolerating the dressing changes without complication. 03/14/18; patient arrives today with the multiplicity of wounds that we are following. He has not been systemically unwell Original wound on the left lateral calf now only has 2 small open areas we've been using Hydrofera Blue which should continue The deep tissue injury on the left heel requires debridement today. We've been using silver alginate The left first second toe and the right first second toe are both are reminiscence what I think was tinea pedis. Apparently some of the callus Surface between the toes was removed last week when it started draining. Purulent drainage coming from the wound on the ischial tuberosity on the left. 03/21/18-He is here in follow-up evaluation for multiple wounds. 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 He tolerated debridement. The only change to last week's recommendations is to discontinue antifungal cream between toes. He will follow-up next week  03/28/18; following up for multiple wounds;Concern this week is streaking redness and swelling in the right foot. He is going to need antibiotics for this. 03/31/18; follow-up for right foot cellulitis. Streaking redness and swelling in the  right foot on 03/28/18. 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 I put him on clindamycin 300 mg every 6 and brought him in for a quick check. He has an open wound between his first and second toes on the right foot as a potential source. 04/04/18; Right foot cellulitis is resolving he is completing clindamycin. This is truly good news Left lateral calf wound which is initial wound only has one small open area inferiorly this is close to healing out. He has compression stockings. We will use Hydrofera Blue right down to the epithelialization of this Nonviable surface on the left heel which was initially pressure with a DTI. We've been using Hydrofera Blue. I'm going to switch this back to silver alginate Left first second toe/tinea pedis this looks better using silver alginate Right first second toe tinea pedis using silver alginate Large pressure ulcers on theLeft ischial tuberosity. Small wound here Looks better. I am uncertain about the surface over the large wound. Using silver alginate 04/11/18; Cellulitis in the right foot is resolved Left lateral calf wound which was his original wounds still has 2 tiny open areas remaining this is just about closed Nonviable surface on the left heel is better but still requires debridement Left first second toe/tinea pedis still open using silver alginate Right first second toe wound tinea pedis I asked him to go back to using ketoconazole and silver alginate Large pressure ulcers on the left ischial tuberosity this shear injury here is resolved. Wound is smaller. No evidence of infection using silver alginate 04/18/18; Patient arrives with an intense area of cellulitis in the right mid lower calf extending into the right heel area. Bright red and warm. Smaller area on the left anterior leg. He has a significant history of MRSA. He will definitely need antibioticsdoxycycline He now has 2 open areas on the left ischial tuberosity the  original large wound and now a satellite area which I think was above his initial satellite areas. Not a wonderful surface on this satellite area surrounding erythema which looks like pressure related. His left lateral calf wound again his original wound is just about closed Left heel pressure injury still requiring debridement Left first second toe looks a lot better using silver alginate Right first second toe also using silver alginate and ketoconazole cream also looks better 04/20/18; the patient was worked in early today out of concerns with his cellulitis on the right leg. I had started him on doxycycline. This was 2 days ago. His wife was concerned about the swelling in the area. Also concerned about the left buttock. He has not been systemically unwell no fever chills. No nausea vomiting or diarrhea 04/25/18; the patient's left buttock wound is continued to deteriorate he is using Hydrofera Blue. He is still completing clindamycin for the cellulitis on the right leg although all of this looks better. 05/02/18 Left buttock wound still with a lot of drainage and a very tightly adherent fibrinous necrotic surface. He has a deeper area superiorly The left lateral calf wound is still closed DTI wound on the left heel necrotic surface especially the circumference using Iodoflex Areas between his left first second toe and right first second toe both look better. Dorsally and the right first second toe he  had a necrotic surface although at smaller. In using silver alginate and ketoconazole. I did a culture last week which was a deep tissue culture of the reminiscence of the open wound on the right first second toe dorsally. This grew a few Acinetobacter and a few methicillin-resistant staph aureus. Nevertheless the area actually this week looked better. I didn't feel the need to specifically address this at least in terms of systemic antibiotics. 05/09/18; wounds are measuring larger more drainage per  our intake. We are using Santyl covered with alginate on the large superficial buttock wounds, Iodosorb on the left heel, ketoconazole and silver alginate to the dorsal first and second toes bilaterally. 05/16/18; The area on his left buttock better in some aspects although the area superiorly over the ischial tuberosity required an extensive debridement.using Santyl Left heel appears stable. Using Iodoflex The areas between his first and second toes are not bad however there is spreading erythema up the dorsal aspect of his left foot this looks like cellulitis again. He is insensate the erythema is really very brilliant.o Erysipelas He went to see an allergist days ago because he was itching part of this he had lab work done. This showed a white count of 15.1 with 70% neutrophils. Hemoglobin of 11.4 and a platelet count of 659,000. Last white count we had in Epic was a 2-1/2 years ago which was 25.9 but he was ill at the time. He was able to show me some lab work that was done by his primary physician the pattern is about the same. 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 But prompted me to go ahead and do x-rays of both feet and the pelvis rule out osteomyelitis. He also had a comprehensive metabolic panel this was reasonably normal his albumin was 3.7 liver function tests BUN/creatinine all normal 05/23/18; x-rays of both his feet from last week were negative for underlying pulmonary abnormality. The x-ray of his pelvis however showed mild irregularity in the left ischial which may represent some early osteomyelitis. The wound in the left ischial continues to get deeper clearly now exposed muscle. Each week necrotic surface material over this area. Whereas the rest of the wounds do not look so bad. The left ischial wound we have been using Santyl and calcium alginate T the left heel surface necrotic debris using Iodoflex o The left lateral leg is still  healed Areas on the left dorsal foot and the right dorsal foot are about the same. There is some inflammation on the left which might represent contact dermatitis, fungal dermatitis I am doubtful cellulitis although this looks better than last week 05/30/18; CT scan done at Hospital did not show any osteomyelitis or abscess. Suggested the possibility of underlying cellulitis although I don't see a lot of evidence of this at the bedside The wound itself on the left buttock/upper thigh actually looks somewhat better. No debridement Left heel also looks better no debridement continue Iodoflex Both dorsal first second toe spaces appear better using Lotrisone. Left still required debridement 06/06/18; Intake reported some purulent looking drainage from the left gluteal wound. Using Santyl and calcium alginate Left heel looks better although still a nonviable surface requiring debridement The left dorsal foot first/second webspace actually expanding and somewhat deeper. I may consider doing a shave biopsy of this area Right dorsal foot first/second webspace appears stable to improved. Using Lotrisone and silver alginate to both these areas 06/13/18 Left gluteal surface looks  better. Now separated in the 2 wounds. No debridement required. Still drainage. We'll continue silver alginate Left heel continues to look better with Iodoflex continue this for at least another week Of his dorsal foot wounds the area on the left still has some depth although it looks better than last week. We've been using Lotrisone and silver alginate 06/20/18 Left gluteal continues to look better healthy tissue Left heel continues to look better healthy granulation wound is smaller. He is using Iodoflex and his long as this continues continue the Iodoflex Dorsal right foot looks better unfortunately dorsal left foot does not. There is swelling and erythema of his forefoot. He had minor trauma to this several days ago but doesn't  think this was enough to have caused any tissue injury. Foot looks like cellulitis, we have had this problem before 06/27/18 on evaluation today patient appears to be doing a little worse in regard to his foot ulcer. 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 Both of which would really be his only options for treating this infection. In the past he has been given and effusion of Orbactiv. This is done very well for him in the past again it's one time dosing IV antibiotic therapy. Subsequently I do believe this is something we're gonna need to see about doing at this point in time. Currently his other wounds seem to be doing somewhat better in my pinion I'm pretty happy in that regard. 07/03/18 on evaluation today patient's wounds actually appear to be doing fairly well. He has been tolerating the dressing changes without complication. All in all he seems to be showing signs of improvement. In regard to the antibiotics he has been dealing with infectious disease since I saw him last week as far as getting this scheduled. In the end [facility]'s [facility] this done this coming Friday. In the meantime he has been continuing to perform the dressing changes in such as previous. There does not appear to be any evidence of infection worsengin at this time. 07/10/18; Since I last saw this man 2 weeks ago things have actually improved. IV antibiotics of resulted in less forefoot erythema although there is still some present. He is not systemically unwell Left buttock wounds 2 now have no depth there is increased epithelialization Using silver alginate Left heel still requires debridement using Iodoflex Left dorsal foot still with a sizable wound about the size of a border but healthy granulation Right dorsal foot still with a  slitlike area using silver alginate 07/18/18; the patient's cellulitis in the left foot is improved in fact I think it is on its way to resolving. Left buttock wounds 2 both look better although the larger one has hypertension granulation we've been using silver alginate Left heel has some thick circumferential redundant skin over the wound edge which will need to be removed today we've been using Iodoflex Left dorsal foot is still a sizable wound required debridement using silver alginate The right dorsal foot is just about closed only a small open area remains here 07/25/18; left foot cellulitis is resolved Left buttock wounds 2 both look better. Hyper-granulation on the major area Left heel as some debris over the surface but otherwise looks a healthier wound. Using silver collagen Right dorsal foot is just about closed 07/31/18; arrives with  our intake nurse worried about purulent drainage from the buttock. We had hyper-granulation here last week His buttock wounds 2 continue to look better Left heel some debris over the surface but measuring smaller. Right dorsal foot unfortunately has openings between the toes Left foot superficial wound looks less aggravated. 08/07/18 Buttock wounds continue to look better although some of her granulation and the larger medial wound. silver alginate Left heel continues to look a lot better.silver collagen Left foot superficial wound looks less stable. Requires debridement. He has a new wound superficial area on the foot on the lateral dorsal foot. Right foot looks better using silver alginate without Lotrisone 08/14/2018; patient was in the ER last week diagnosed with a UTI. He is now on Cefpodoxime and Macrodantin. Buttock wounds continued to be smaller. Using silver alginate Left heel continues to look better using silver collagen Left foot superficial wound looks as though it is improving Right dorsal foot area is just about healed. 08/21/2018; patient is  completed his antibiotics for his UTI. He has 2 open areas on the buttocks. There is still not closed although the surface looks satisfactory. Using silver alginate Left heel continues to improve using silver collagen The bilateral dorsal foot areas which are at the base of his first and second toes/possible tinea pedis are actually stable on the left but worse on the right. The area on the left required debridement of necrotic surface. After debridement I obtained a specimen for PCR culture. The right dorsal foot which is been just about healed last week is now reopened 08/28/2018; culture done on the left dorsal foot showed coag negative staph both staph epidermidis and Lugdunensis. I think this is worthwhile initiating systemic treatment. I will use doxycycline given his long list of allergies. The area on the left heel slightly improved but still requiring debridement. The large wound on the buttock is just about closed whereas the smaller one is larger. Using silver alginate in this area 09/04/2018; patient is completing his doxycycline for the left foot although this continues to be a very difficult wound area with very adherent necrotic debris. We are using silver alginate to all his wounds right foot left foot and the small wounds on his buttock, silver collagen on the left heel. 09/11/2018; once again this patient has intense erythema and swelling of the left forefoot. Lesser degrees of erythema in the right foot. He has a long list of allergies and intolerances. I will reinstitute doxycycline. 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 Using silver alginate Left heel also looks better using silver collagen Unfortunately both the areas on his feet look worse. The area on the left first second webspace is now gone through to the plantar part of his foot. The area on the left foot anteriorly is irritated with erythema and swelling in the  forefoot. 09/25/2018 His wound on the left plantar heel looks better. Using silver collagen The area on the left buttock 2 small remnant areas. One is closed one is still open. Using silver alginate The areas between both his first and second toes look worse. This in spite of long-standing antifungal therapy with ketoconazole and silver alginate which should have antifungal activity He has small areas around his original wound on the left calf one is on the bottom of the original scar tissue and one superiorly both of these are small and superficial but again given wound history in this site this is  worrisome 10/02/2018 Left plantar heel continues to gradually contract using silver collagen Left buttock wound is unchanged using silver alginate The areas on his dorsal feet between his first and second toes bilaterally look about the same. I prescribed clindamycin ointment to see if we can address chronic staph colonization and also the underlying possibility of erythrasma The left lateral lower extremity wound is actually on the lateral part of his ankle. Small open area here. We have been using silver alginate 10/09/2018; Left plantar heel continues to look healthy and contract. No debridement is required Left buttock slightly smaller with a tape injury wound just below which was new this week Dorsal feet somewhat improved I have been using clindamycin Left lateral looks lower extremity the actual open area looks worse although a lot of this is epithelialized. I am going to change to silver collagen today He has a lot more swelling in the right leg although this is not pitting not red and not particularly warm there is a lot of spasm in the right leg usually indicative of people with paralysis of some underlying discomfort. We have reviewed his vascular status from 2017 he had a left greater saphenous vein ablation. I wonder about referring him back to vascular surgery if the area on the left  leg continues to deteriorate. 10/16/2018 in today for follow-up and management of multiple lower extremity ulcers. His left Buttock wound is much lower smaller and almost closed completely. The wound to the left ankle has began to reopen with Epithelialization and some adherent slough. He has multiple new areas to the left foot and leg. The left dorsal foot without much improvement. Wound present between left great webspace and 2nd toe. Erythema and edema present right leg. Right LE ultrasound obtained on 10/10/18 was negative for DVT . 10/23/2018; Left buttock is closed over. Still dry macerated skin but there is no open wound. I suspect this is chronic pressure/moisture Left lateral calf is quite a bit worse than when I saw this last. There is clearly drainage here he has macerated skin into the left plantar heel. We will change the primary dressing to alginate Left dorsal foot has some improvement in overall wound area. Still using clindamycin and silver alginate Right dorsal foot about the same as the left using clindamycin and silver alginate The erythema in the right leg has resolved. He is DVT rule out was negative Left heel pressure area required debridement although the wound is smaller and the surface is health 10/26/2018 The patient came back in for his nurse check today predominantly because of the drainage coming out of the left lateral leg with a recent reopening of his original wound on the left lateral calf. He comes in today with a large amount of surrounding erythema around the wound extending from the calf into the ankle and even in the area on the dorsal foot. He is not systemically unwell. He is not febrile. Nevertheless this looks like cellulitis. We have been using silver alginate to the area. I changed him to a regular visit and I am going to prescribe him doxycycline. The rationale here is a long list of medication intolerances and a history of MRSA. I did not see anything  that I thought would provide a valuable culture 10/30/2018 Follow-up from his appointment 4 days ago with really an extensive area of cellulitis in the left calf left lateral ankle and left dorsal foot. I put him on doxycycline. He has a long list of medication allergies  which are true allergy reactions. 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 In any case he is a lot better today. The erythema especially in the anterior and lateral left calf is better. He still has left ankle erythema. He also is complaining about increasing edema in the right leg we have only been using Kerlix Coban and he has been doing the wraps at home. Finally he has a spotty rash on the medial part of his upper left calf which looks like folliculitis or perhaps wrap occlusion type injury. Small superficial macules not pustules 11/06/18 patient arrives today with again a considerable degree of erythema around the wound on the left lateral calf extending into the dorsal ankle and dorsal foot. This is a lot worse than when I saw this last week. He is on doxycycline really with not a lot of improvement. He has not been systemically unwell Wounds on the; left heel actually looks improved. Original area on the left foot and proximity to the first and second toes looks about the same. He has superficial areas on the dorsal foot, anterior calf and then the reopening of his original wound on the left lateral calf which looks about the same The only area he has on the right is the dorsal webspace first and second which is smaller. He has a large area of dry erythematous skin on the left buttock small open area here. 11/13/2018; the patient arrives in much better condition. The erythema around the wound on the left lateral calf is a lot better. 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 In any case this is a lot better. The area on the left heel is very small and just about resolved using silver collagen we have been using silver alginate to the areas on his dorsal feet 11/20/2018; his wounds include the left lateral calf, left heel, dorsal aspects of both feet just proximal to the first second webspace. He is stable to slightly improved. I did not think any changes to his dressings were going to be necessary 11/27/2018 he has a reopening on the left buttock which is surrounded by what looks like tinea or perhaps some other form of dermatitis. The area on the left dorsal foot has some erythema around it I have marked this area but I am not sure whether this is cellulitis or not. Left heel is not closed. Left calf the reopening is really slightly longer and probably worse 1/13; in general things look better and smaller except for the left dorsal foot. Area on the left heel is just about closed, left buttock looks better only a small wound remains in the skin looks better [using Lotrisone] 1/20; the area on the left heel only has a few remaining open areas here. Left lateral calf about the same in terms of size, left dorsal foot slightly larger right lateral foot still not closed. The area on the left buttock has no open wound and the surrounding skin looks a lot better 1/27; the area on the left heel is closed. Left lateral calf better but still requiring extensive debridements. The area on his left buttock is closed. He still has the open areas on the left dorsal foot which is slightly smaller in the right foot which is slightly expanded. We have been using Iodoflex on these areas as well 2/3; left heel is  closed. Left lateral calf still requiring debridement using Iodoflex there is no open area on his left buttock however he has dry scaly skin over a large area of this. Not really responding well to the Lotrisone. Finally the areas on his dorsal feet at the level of the  first second webspace are slightly smaller on the right and about the same on the left. Both of these vigorously debrided with Anasept and gauze 2/10; left heel remains closed he has dry erythematous skin over the left buttock but there is no open wound here. Left lateral leg has come in and with. Still requiring debridement we have been using Iodoflex here. Finally the area on the left dorsal foot and right dorsal foot are really about the same extremely dry callused fissured areas. He does not yet have a dermatology appointment 2/17; left heel remains closed. He has a new open area on the left buttock. The area on the left lateral calf is bigger longer and still covered in necrotic debris. No major change in his foot areas bilaterally. I am awaiting for a dermatologist to look on this. We have been using ketoconazole I do not know that this is been doing any good at all. 2/24; left heel remains closed. The left buttock wound that was new reopening last week looks better. The left lateral calf appears better also although still requires debridement. The major area on his foot is the left first second also requiring debridement. We have been putting Prisma on all wounds. I do not believe that the ketoconazole has done too much good for his feet. He will use Lotrisone I am going to give him a 2-week course of terbinafine. We still do not have a dermatology appointment 3/2 left heel remains closed however there is skin over bone in this area I pointed this out to him today. The left buttock wound is epithelialized but still does not look completely stable. The area on the left leg required debridement were using silver collagen here. With regards to his feet we changed to Lotrisone last week and silver alginate. 3/9; left heel remains closed. Left buttock remains closed. The area on the right foot is essentially closed. The left foot remains unchanged. Slightly smaller on the left lateral calf. Using  silver collagen to both of these areas 3/16-Left heel remains closed. Area on right foot is closed. Left lateral calf above the lateral malleolus open wound requiring debridement with easy bleeding. Left dorsal wound proximal to first toe also debrided. Left ischial area open new. Patient has been using Prisma with wrapping every 3 days. Dermatology appointment is apparently tomorrow.Patient has completed his terbinafine 2-week course with some apparent improvement according to him, there is still flaking and dry skin in his foot on the left 3/23; area on the right foot is reopened. The area on the left anterior foot is about the same still a very necrotic adherent surface. He still has the area on the left leg and reopening is on the left buttock. He apparently saw dermatology although I do not have a note. According to the patient who is usually fairly well informed they did not have any good ideas. Put him on oral terbinafine which she is been on before. 3/30; using silver collagen to all wounds. Apparently his dermatologist put him on doxycycline and rifampin presumably some culture grew staph. I do not have this result. He remains on terbinafine although I have used terbinafine on him before 4/6; patient has had  a fairly substantial reopening on the right foot between the first and second toes. He is finished his terbinafine and I believe is on doxycycline and rifampin still as prescribed by dermatology. We have been using silver collagen to all his wounds although the patient reports that he thinks silver alginate does better on the wounds on his buttock. 4/13; the area on his left lateral calf about the same size but it did not require debridement. Left dorsal foot just proximal to the webspace between the first and second toes is about the same. Still nonviable surface. I note some superficial bronze discoloration of the dorsal part of his foot Right dorsal foot just proximal to the first  and second toes also looks about the same. I still think there may be the same discoloration I noted above on the left Left buttock wound looks about the same 4/20; left lateral calf appears to be gradually contracting using silver collagen. 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 The left dorsal foot wound is debrided of tightly adherent necrotic debris and really cleans up quite nicely. The right area is worse with expansion. I did not debride this it is now over the base of the second toe The area on his left buttock is smaller no debridement is required using silver collagen 5/4; left calf continues to make good progress. He arrives with erythema around the wounds on his dorsal foot which even extends to the plantar aspect. Very concerning for coexistent infection. He is finished the erythromycin I gave him for possible erythrasma this does not seem to have helped. The area on the left foot is about the same base of the dorsal toes Is area on the buttock looks improved on the left 5/11; left calf and left buttock continued to make good progress. Left foot is about the same to slightly improved. Major problem is on the right foot. He has not had an x-ray. Deep tissue culture I did last week showed both Enterobacter and E. coli. I did not change the doxycycline I put him on empirically although neither 1 of these were plated to doxycycline. He arrives today with the erythema looking worse on both the dorsal and plantar foot. Macerated skin on the bottom of the foot. he has not been systemically unwell 5/18-Patient returns at 1 week, left calf wound appears to be making some progress, left buttock wound appears slightly worse than last time, left foot wound looks slightly better, right foot redness is marginally better. X-ray of both feet show no air or evidence of osteomyelitis. Patient is finished his Omnicef and terbinafine. He  continues to have macerated skin on the bottom of the left foot as well as right 5/26; left calf wound is better, left buttock wound appears to have multiple small superficial open areas with surrounding macerated skin. X-rays that I did last time showed no evidence of osteomyelitis in either foot. He is finished cefdinir and doxycycline. I do not think that he was on terbinafine. He continues to have a large superficial open area on the right foot anterior dorsal and slightly between the first and second toes. I did send him to dermatology 2 months ago or so wondering about whether they would do a fungal scraping. I do not believe they did but did do a culture. We have been using silver alginate to the toe areas, he has been using antifungals at home topically either ketoconazole or Lotrisone. We are using silver  collagen on the left foot, silver alginate on the right, silver collagen on the left lateral leg and silver alginate on the left buttock 6/1; left buttock area is healed. We have the left dorsal foot, left lateral leg and right dorsal foot. We are using silver alginate to the areas on both feet and silver collagen to the area on his left lateral calf 6/8; the left buttock apparently reopened late last week. He is not really sure how this happened. He is tolerating the terbinafine. Using silver alginate to all wounds 6/15; left buttock wound is larger than last week but still superficial. Came in the clinic today with a report of purulence from the left lateral leg I did not identify any infection Both areas on his dorsal feet appear to be better. He is tolerating the terbinafine. Using silver alginate to all wounds 6/22; left buttock is about the same this week, left calf quite a bit better. His left foot is about the same however he comes in with erythema and warmth in the right forefoot once again. Culture that I gave him in the beginning of May showed Enterobacter and E. coli. I gave him  doxycycline and things seem to improve although neither 1 of these organisms was specifically plated. 6/29; left buttock is larger and dry this week. Left lateral calf looks to me to be improved. Left dorsal foot also somewhat improved right foot completely unchanged. The erythema on the right foot is still present. He is completing the Ceftin dinner that I gave him empirically [see discussion above.) 7/6 - All wounds look to be stable and perhaps improved, the left buttock wound is slightly smaller, per patient bleeds easily, completed ceftin, the right foot redness is less, he is on terbinafine 7/13; left buttock wound about the same perhaps slightly narrower. Area on the left lateral leg continues to narrow. Left dorsal foot slightly smaller right foot about the same. We are using silver alginate on the right foot and Hydrofera Blue to the areas on the left. Unna boot on the left 2 layer compression on the right 7/20; left buttock wound absolutely the same. Area on lateral leg continues to get better. Left dorsal foot require debridement as did the right no major change in the 7/27; left buttock wound the same size necrotic debris over the surface. The area on the lateral leg is closed once again. His left foot looks better right foot about the same although there is some involvement now of the posterior first second toe area. He is still on terbinafine which I have given him for a month, not certain a centimeter major change 06/25/19-All wounds appear to be slightly improved according to report, left buttock wound looks clean, both foot wounds have minimal to no debris the right dorsal foot has minimal slough. We are using Hydrofera Blue to the left and silver alginate to the right foot and ischial wound. 8/10-Wounds all appear to be around the same, the right forefoot distal part has some redness which was not there before, however the wound looks clean and small. Ischial wound looks about the  same with no changes 8/17; his wound on the left lateral calf which was his original chronic venous insufficiency wound remains closed. Since I last saw him the areas on the left dorsal foot right dorsal foot generally appear better but require debridement. The area on his left initial tuberosity appears somewhat larger to me perhaps hyper granulated and bleeds very easily. We have been  using Hydrofera Blue to the left dorsal foot and silver alginate to everything else 8/24; left lateral calf remains closed. The areas on his dorsal feet on the webspace of the first and second toes bilaterally both look better. The area on the left buttock which is the pressure ulcer stage II slightly smaller. I change the dressing to Hydrofera Blue to all areas 8/31; left lateral calf remains closed. The area on his dorsal feet bilaterally look better. Using Hydrofera Blue. Still requiring debridement on the left foot. No change in the left buttock pressure ulcers however 9/14; left lateral calf remains closed. Dorsal feet look quite a bit better than 2 weeks ago. Flaking dry skin also a lot better with the ammonium lactate I gave him 2 weeks ago. The area on the left buttock is improved. He states that his Roho cushion developed a leak and he is getting a new one, in the interim he is offloading this vigorously 9/21; left calf remains closed. Left heel which was a possible DTI looks better this week. He had macerated tissue around the left dorsal foot right foot looks satisfactory and improved left buttock wound. I changed his dressings to his feet to silver alginate bilaterally. Continuing Hydrofera Blue on the left buttock. 9/28 left calf remains closed. Left heel did not develop anything [possible DTI] dry flaking skin on the left dorsal foot. Right foot looks satisfactory. Improved left buttock wound. We are using silver alginate on his feet Hydrofera Blue on the buttock. I have asked him to go back to the  Lotrisone on his feet including the wounds and surrounding areas 10/5; left calf remains closed. The areas on the left and right feet about the same. A lot of this is epithelialized however debris over the remaining open areas. He is using Lotrisone and silver alginate. The area on the left buttock using Hydrofera Blue 10/26. Patient has been out for 3 weeks secondary to Covid concerns. He tested negative but I think his wife tested positive. He comes in today with the left foot substantially worse, right foot about the same. Even more concerning he states that the area on his left buttock closed over but then reopened and is considerably deeper in one aspect than it was before [stage III wound] 11/2; left foot really about the same as last week. Quarter sized wound on the dorsal foot just proximal to the first second toes. Surrounding erythema with areas of denuded epithelium. This is not really much different looking. Did not look like cellulitis this time however. Right foot area about the same.. We have been using silver alginate alginate on his toes Left buttock still substantial irritated skin around the wound which I think looks somewhat better. We have been using Hydrofera Blue here. 11/9; left foot larger than last week and a very necrotic surface. Right foot I think is about the same perhaps slightly smaller. Debris around the circumference also addressed. Unfortunately on the left buttock there is been a decline. Satellite lesions below the major wound distally and now a an additional one posteriorly we have been using Hydrofera Blue but I think this is a pressure issue 11/16; left foot ulcer dorsally again a very adherent necrotic surface. Right foot is about the same. Not much change in the pressure ulcer on his left buttock. 11/30; left foot ulcer dorsally basically the same as when I saw him 2 weeks ago. Very adherent fibrinous debris on the wound surface. Patient reports a lot  of  drainage as well. 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 We have been using Iodoflex, patient changed back to alginate because of the drainage. Area on his right dorsal foot really looks benign with a healthier surface certainly a lot better than on the left. Left buttock wounds all improved using Hydrofera Blue 12/7; left dorsal foot again no improvement. Tightly adherent debris. PCR culture I did last week only showed likely skin contaminant. I have gone ahead and done a punch biopsy of this which is about the last thing in terms of investigations I can think to do. He has known venous insufficiency and venous hypertension and this could be the issue here. The area on the right foot is about the same left buttock slightly worse according to our intake nurse secondary to [facility] Blue sticking to the wound 12/14; biopsy of the left foot that I did last time showed changes that could be related to wound healing/chronic stasis dermatitis phenomenon no neoplasm. We have been using silver alginate to both feet. I change the one on the left today to Sorbact and silver alginate to his other 2 wounds 12/28; the patient arrives with the following problems; Major issue is the dorsal left foot which continues to be a larger deeper wound area. Still with a completely nonviable surface Paradoxically the area mirror image on the right on the right dorsal foot appears to be getting better. He had some loss of dry denuded skin from the lower part of his original wound on the left lateral calf. Some of this area looked a little vulnerable and for this reason we put him in wrap that on this side this week The area on his left buttock is larger. He still has the erythematous circular area which I think is a combination of pressure, sweat. This does not look like cellulitis or fungal dermatitis 11/26/2019; -Dorsal left foot large open wound with depth. Still  debris over the surface. Using Sorbact The area on the dorsal right foot paradoxically has closed over He has a reopening on the left ankle laterally at the base of his original wound that extended up into the calf. This appears clean. The left buttock wound is smaller but with very adherent necrotic debris over the surface. We have been using silver alginate here as well The patient had arterial studies done in 2017. He had biphasic waveforms at the dorsalis pedis and posterior tibial bilaterally. ABI in the left was 1.17. Digit waveforms were dampened. He has slight spasticity in the great toes I do not think a TBI would be possible 1/11; the patient comes in today with a sizable reopening between the first and second toes on the right. This is not exactly in the same location where we have been treating wounds previously. According to our intake nurse this was actually fairly deep but 0.6 cm. The area on the left dorsal foot looks about the same the surface is somewhat cleaner using Sorbact, his MRI is in 2 days. We have not managed yet to get arterial studies. The new reopening on the left lateral calf looks somewhat better using alginate. The left buttock wound is about the same using alginate 1/18; the patient had his ARTERIAL studies which were quite normal. ABI in the right at 1.13 with triphasic/biphasic waveforms on the left ABI 1.06 again with triphasic/biphasic waveforms. It would not have been possible to have done a toe brachial index because of spasticity. We have  been using Sorbac to the left foot alginate to the rest of his wounds on the right foot left lateral calf and left buttock 1/25; arrives in clinic with erythema and swelling of the left forefoot worse over the first MTP area. This extends laterally dorsally and but also posteriorly. Still has an area on the left lateral part of the lower part of his calf wound it is eschared and clearly not closed. Area on the left buttock  still with surrounding irritation and erythema. Right foot surface wound dorsally. The area between the right and first and second toes appears better. 2/1; The left foot wound is about the same. Erythema slightly better I gave him a week of doxycycline empirically Right foot wound is more extensive extending between the toes to the plantar surface Left lateral calf really no open surface on the inferior part of his original wound however the entire area still looks vulnerable Absolutely no improvement in the left buttock wound required debridement. 2/8; the left foot is about the same. Erythema is slightly improved I gave him clindamycin last week. Right foot looks better he is using Lotrimin and silver alginate He has a breakdown in the left lateral calf. Denuded epithelium which I have removed Left buttock about the same were using Hydrofera Blue 2/15; left foot is about the same there is less surrounding erythema. Surface still has tightly adherent debris which I have debriding however not making any progress Right foot has a substantial wound on the medial right second toe between the first and second webspace. Still an open area on the left lateral calf distal area. Buttock wound is about the same 2/22; left foot is about the same less surrounding erythema. Surface has adherent debris. Polymen Ag Right foot area significant wound between the first and second toes. We have been using silver alginate here Left lateral leg polymen Ag at the base of his original venous insufficiency wound Left buttock some improvement here 3/1; Right foot is deteriorating in the first second toe webspace. Larger and more substantial. We have been using silver alginate. Left dorsal foot about the same markedly adherent surface debris using PolyMem Ag Left lateral calf surface debris using PolyMem AG Left buttock is improved again using PolyMem Ag. He is completing his terbinafine. The erythema in the foot  seems better. He has been on this for 2 weeks 3/8; no improvement in any wound area in fact he has a small open area on the dorsal midfoot which is new this week. He has not gotten his foot x-rays yet 3/15; his x-rays were both negative for osteomyelitis of both feet. No major change in any of his wounds on the extremities however his buttock wounds are better. We have been using polymen on the buttocks, left lower leg. Iodoflex on the left foot and silver alginate on the right 3/22; arrives in clinic today with the 2 major issues are the improvement in the left dorsal foot wound which for once actually looks healthy with a nice healthy wound surface without debridement. Using Iodoflex here. Unfortunately on the left lateral calf which is in the distal part of his original wound he came to the clinic here for there was purulent drainage noted some increased breakdown scattered around the original area and a small area proximally. We we are using polymen here will change to silver alginate today. His buttock wound on the left is better and I think the area on the right first second toe webspace is  also improved 3/29; left dorsal foot looks better. Using Iodoflex. Left ankle culture from deterioration last time grew E. coli, Enterobacter and Enterococcus. I will give him a course of cefdinir although that will not cover Enterococcus. The area on the right foot in the webspace of the first and second toe lateral first toe looks better. The area on his buttock is about healed Vascular appointment is on April 21. This is to look at his venous system vis--vis continued breakdown of the wounds on the left including the left lateral leg and left dorsal foot he. He has had previous ablations on this side 4/5; the area between the right first and second toes lateral aspect of the first toe looks better. Dorsal aspect of the left first toe on the left foot also improved. Unfortunately the left lateral lower leg  is larger and there is a second satellite wound superiorly. The usual superficial abrasions on the left buttock overall better but certainly not closed 4/12; the area between the right first and second toes is improved. Dorsal aspect of the left foot also slightly smaller with a vibrant healthy looking surface. No real change in the left lateral leg and the left buttock wound is healed He has an unaffordable co-pay for Apligraf. Appointment with vein and vascular with regards to the left leg venous part of the circulation is on 4/21 4/19; we continue to see improvement in all wound areas. Although this is minor. He has his vascular appointment on 4/21. The area on the left buttock has not reopened although right in the center of this area the skin looks somewhat threatened 4/26; the left buttock is unfortunately reopened. In general his left dorsal foot has a healthy surface and looks somewhat smaller although it was not measured as such. The area between his first and second toe webspace on the right as a small wound against the first toe. The patient saw vascular surgery. The real question I was asking was about the small saphenous vein on the left. He has previously ablated left greater saphenous vein. Nothing further was commented on on the left. Right greater saphenous vein without reflux at the saphenofemoral junction or proximal thigh there was no indication for ablation of the right greater saphenous vein duplex was negative for DVT bilaterally. They did not think there was anything from a vascular surgery point of view that could be offered. They ABIs within normal limits 5/3; only small open area on the left buttock. The area on the left lateral leg which was his original venous reflux is now 2 wounds both which look clean. We are using Iodoflex on the left dorsal foot which looks healthy and smaller. He is down to a very tiny area between the right first and second toes, using  silver alginate 5/10; all of his wounds appear better. We have much better edema control in 4 layer compression on the left. This may be the factor that is allowing the left foot and left lateral calf to heal. He has external compression garments at home 04/14/20-All of his wounds are progressing well, the left forefoot is practically closed, left ischium appears to be about the same, right toe webspace is also smaller. The left lateral leg is about the same, continue using Hydrofera Blue to this, silver alginate to the ischium, Iodoflex to the toe space on the right 6/7; most of his wounds outside of the left buttock are doing well. The area on the left lateral calf and left  dorsal foot are smaller. The area on the right foot in between the first and second toe webspace is barely visible although he still says there is some drainage here is the only reason I did not heal this out. Unfortunately the area on the left buttock almost looks like he has a skin tear from tape. He has open wound and then a large flap of skin that we are trying to get adherence over an area just next to the remaining wound 6/21; 2 week follow-up. I believe is been here for nurse visits. Miraculously the area between his first and second toes on the left dorsal foot is closed over. Still open on the right first second web space. The left lateral calf has 2 open areas. Distally this is more superficial. The proximal area had a little more depth and required debridement of adherent necrotic material. His buttock wound is actually larger we have been using silver alginate here 6/28; the patient's area on the left foot remains closed. Still open wet area between the first and second toes on the right and also extending into the plantar aspect. We have been using silver alginate in this location. He has 2 areas on the left lower leg part of his original long wounds which I think are better. We have been using Hydrofera Blue here.  Hydrofera Blue to the left buttock which is stable 7/12; left foot remains closed. Left ankle is closed. May be a small area between his right first and second toes the only truly open area is on the left buttock. We have been using Hydrofera Blue here 7/19; patient arrives with marked deterioration especially in the left foot and ankle. We did not put him in a compression wrap on the left last week in fact he wore his juxta lite stockings on either side although he does not have an underlying stocking. He has a reopening on the left dorsal foot, left lateral ankle and a new area on the right dorsal ankle. More worrisome is the degree of erythema on the left foot extending on the lateral foot into the lateral lower leg on the left 7/26; the patient had erythema and drainage from the lateral left ankle last week. Culture of this grew MRSA resistant to doxycycline and clindamycin which are the 2 antibiotics we usually use with this patient who has multiple antibiotic allergies including linezolid, trimethoprim sulfamethoxazole. I had give him an empiric doxycycline and he comes in the area certainly looks somewhat better although it is blotchy in his lower leg. He has not been systemically unwell. He has had areas on the left dorsal foot which is a reopening, chronic wounds on the left lateral ankle. Both of these I think are secondary to chronic venous insufficiency. The area between his first and second toes is closed as far as I can tell. He had a new wrap injury on the right dorsal ankle last week. Finally he has an area on the left buttock. We have been using silver alginate to everything except the left buttock we are using Hydrofera Blue 06/30/20-Patient returns at 1 week, has been given a sample dose pack of NUZYRA which is a tetracycline derivative [omadacycline], patient has completed those, we have been using silver alginate to almost all the wounds except the left ischium where we are using  Hydrofera Blue all of them look better 8/16; since I last saw the patient he has been doing well. The area on the left buttock, left lateral  ankle and left foot are all closed today. He has completed the [place] I gave him last time and tolerated this well. He still has open areas on the right dorsal ankle and in the right first second toe area which we are using silver alginate. 8/23; we put him in his bilateral external compression stockings last week as he did not have anything open on either leg except for concerning area between the right first and second toe. He comes in today with an area on the left dorsal foot slightly more proximal than the original wound, the left lateral foot but this is actually a continuation of the area he had on the left lateral ankle from last time. As well he is opened up on the left buttock again. 8/30; comes in today with things looking a lot better. The area on the left lower ankle has closed down as has the left foot but with eschar in both areas. The area on the dorsal right ankle is also epithelialized. Very little remaining of the left buttock wound. We have been using silver alginate on all wound areas 9/13; the area in the first second toe webspace on the right has fully epithelialized. He still has some vulnerable epithelium on the right and the ankle and the dorsal foot. He notes weeping. He is using his juxta lite stocking. On the left again the left dorsal foot is closed left lateral ankle is closed. We went to the juxta lite stocking here as well. Still vulnerable in the left buttock although only 2 small open areas remain here 9/27; 2-week follow-up. We did not look at his left leg but the patient says everything is closed. He is a bit disturbed by the amount of edema in his left foot he is using juxta lite stockings but asking about over the toes stockings which would be 30/40, will talk to him next time. According to him there is no open wound on  either the left foot or the left ankle/calf He has an open area on the dorsal right calf which I initially point a wrap injury. He has superficial remaining wound on the left ischial tuberosity been using silver alginate although he says this sticks to the wound 10/5; we gave him 2-week follow-up but he called yesterday expressing some concerns about his right foot right ankle and the left buttock. He came in early. There is still no open areas on the left leg and that still in his juxta lite stocking 10/11; he only has 1 small area on the left buttock that remains measuring millimeters 1 mm. Still has the same irritated skin in this area. We recommended zinc oxide when this eventually closes and pressure relief is meticulously is he can do this. He still has an area on the dorsal part of his right first through third toes which is a bit irritated and still open and on the dorsal ankle near the crease of the ankle. We have been using silver alginate and using his own stocking. He has nothing open on the left leg or foot 10/25; 2-week follow-up. Not nearly as good on the left buttock as I was hoping. For open areas with 5 looking threatened small. He has the erythematous irritated chronic skin in this area. 1 area on the right dorsal ankle. He reports this area bleeds easily Right dorsal foot just proximal to the base of his toes We have been using silver alginate. 11/8; 2-week follow-up. Left buttock is about the same  although I do not think the wounds are in the same location we have been using silver alginate. I have asked him to use zinc oxide on the skin around the wounds. He still has a small area on the right dorsal ankle he reports this bleeds easily Right dorsal foot just proximal to the base of the toes does not have anything open although the skin is very dry and scaly He has a new opening on the nailbed of the left great toe. Nothing on the left ankle 11/29; 3-week follow-up. Left  buttock has 2 open areas. And washing of these wounds today started bleeding easily. Suggesting very friable tissue. We have been using silver alginate. Right dorsal ankle which I thought was initially a wrap injury we have been using silver alginate. Nothing open between the toes that I can see. He states the area on the left dorsal toe nailbed healed after the last visit in 2 or 3 days 12/13; 3-week follow-up. His left buttock now has 3 open areas but the original 2 areas are smaller using polymen here. Surrounding skin looks better. The right dorsal ankle is closed. He has a small opening on the right dorsal foot at the level of the third toe. In general the skin looks better here. He is wearing his juxta lite stocking on the left leg says there is nothing open 11/24/2020; 3 weeks follow-up. His left buttock still has the 3 open areas. We have been using polymen but due to lack of response he changed to [facility] area. Surrounding skin is dry erythematous and irritated looking. There is no evidence of infection either bacterial or fungal however there is loss of surface epithelium He still has very dry skin in his foot causing irritation and erythema on the dorsal part of his toes. This is not responded to prolonged courses of antifungal simply looks dry and irritated 1/24; left buttock area still looks about the same he was unable to find the triad ointment that we had suggested. The area on the right lower leg just above the dorsal ankle has reopened and the areas on the right foot between the first second and second third toes and scaling on the bottom of the foot has been about the same for quite some time now. been using silver alginate to all wound areas 2/7; left buttock wound looked quite good although not much smaller in terms of surface area surrounding skin looks better. Only a few dry flaking areas on the right foot in between the first and second toes the skin generally looks better  here [ammonium lactate]. Finally the area on the right dorsal ankle is closed 2/21; There is no open area on the right foot even between the right first and second toe. Skin around this area dorsally and plantar aspects look better. He has a reopening of the area on the right ankle just above the crease of the ankle dorsally. I continue to think that this is probably friction from spasms may be even this time with his stocking under the compression stockings. Wounds on his left buttock look about the same there a couple of areas that have reopened. He has a total square area of loss of epithelialization. This does not look like infection it looks like a contact dermatitis but I just cannot determine to what 3/14; there is nothing on the right foot between the first and second toes this was carefully inspected under illumination. Some chronic irritation on the dorsal part  of his foot from toes 1-3 at the base. Nothing really open here substantially. Still has an area on the right foot/ankle that is actually larger and hyper granulated. His buttock area on the left is just about closed however he has chronic inflammation with loss of the surface epithelial layer 3/28; 2-week follow-up. In clinic today with a new wound on the left anterior mid tibia. Says this happened about 2 weeks ago. He is not really sure how wonders about the spasticity of his legs at night whether that could have caused this other than that he does not have a good idea. He has been using topical antibiotics and silver alginate. The area on his right dorsal ankle seems somewhat better. Finally everything on his left buttock is closed. 4/11; 2-week follow-up. All of his wounds are better except for the area over the ischium and left buttock which have opened up widely again. At least part of this is covered in necrotic fibrinous material another part had rolled nonviable skin. The area on the right ankle, left anterior mid tibia are  both a lot better. He had no open wounds on either foot including the areas between the first and second toes 4/25; patient presents for 2-week follow-up. He states that the wounds are overall stable. He has no complaints today and states he is using Hydrofera Blue to open wounds. 5/9; have not seen this man in over a month. For my memory he has open areas on the left mid tibia and right ankle. T oday he has new open area on the right dorsal foot which we have not had a problem with recently. He has the sustained area on the left buttock He is also changed his insurance at the beginning of the year [employer]. We will need prior authorizations for debridement 5/23; patient presents for 2-week follow-up. He has prior authorizations for debridement. He denies any issues in the past 2 weeks with his wound care. He has been using Hydrofera Blue to all the wounds. He does report a circular rash to the upper left leg that is new. He denies acute signs of infection. 6/6; 2-week follow-up. The patient has open wounds on the left buttock which are worse than the last time I saw this about a month ago. He also has a new area to me on the left anterior mid tibia with some surrounding erythema. The area on the dorsal ankle on the right is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 planned for a venous ablation 03/25/16; aggressive debridement again of both wound areas on the left lateral leg. He is due for ablation surgery on May 22. He is much closer to being ready for an Apligraf. Has a new area between the left first and second toes 04/01/16 aggressive debridement done of both wounds. The new wound at the base of between his second and first toes looks stable 04/08/16; continued aggressive debridement of both wounds on the left lower leg. He goes for his venous ablation on Monday. The new wound at the base of his first and second toes dorsally appears stable. 04/15/16; wounds aggressively debridement although the base of this looks considerably better Apligraf #1. He had ablation surgery on Monday I'll need to research these records. We only have approval for four Apligraf's 04/22/16; the patient is here for a wound check [Apligraf last week] intake nurse concerned about erythema around the wounds. Apparently a significant degree of drainage. The patient has chronic venous inflammation which I think accounts for most of this however I was asked to look at this today 04/26/16; the patient came back for check of possible cellulitis in his left foot however the Apligraf dressing was inadvertently removed therefore we elected to prep the wound for a second Apligraf. I put him on doxycycline on 6/1 the erythema in the foot 05/03/16 we did not remove the dressing from the superior wound as this is where I put all of his last Apligraf. Surface debridement done with a curette of the lower wound which looks very healthy. The area on the left foot also looks quite satisfactory at the dorsal artery at the first and  second toes 05/10/16; continue Apligraf to this. Her wound, Hydrafera to the lower wound. He has a new area on the right second toe. Left dorsal foot firstoosecond toe also looks improved 05/24/16; wound dimensions must be smaller I was able to use Apligraf to all 3 remaining wound areas. 06/07/16 patient's last Apligraf was 2 weeks ago. He arrives today with the 2 wounds on his lateral left leg joined together. This would have to be seen as a negative. He also has a small wound in his first and second toe on the left dorsally with quite a bit of surrounding erythema in the first second and third toes. This looks to be infected or inflamed, very difficult clinical call. 06/21/16: lateral left leg combined wounds. Adherent surface slough area on the left dorsal foot at roughly the fourth toe looks improved 07/12/16; he now has a single linear wound on the lateral left leg. This does not look to be a lot changed from when I lost saw this. The area on his dorsal left foot looks considerably better however. 08/02/16; no major change in the substantial area on his left lateral leg since last time. We have been using Hydrofera Blue for a prolonged period of time now. The area on his left foot is also unchanged from last review 07/19/16; the area on his dorsal foot  on the left looks considerably smaller. He is beginning to have significant rims of epithelialization on the lateral left leg wound. This also looks better. 08/05/16; the patient came in for a nurse visit today. Apparently the area on his left lateral leg looks better and it was wrapped. However in general discussion the patient noted a new area on the dorsal aspect of his right second toe. The exact etiology of this is unclear but likely relates to pressure. 08/09/16 really the area on the left lateral leg did not really look that healthy today perhaps slightly larger and measurements. The area on his dorsal right second toe is improved also the left  foot wound looks stable to improved 08/16/16; the area on the last lateral leg did not change any of dimensions. Post debridement with a curet the area looked better. Left foot wound improved and the area on the dorsal right second toe is improved 08/23/16; the area on the left lateral leg may be slightly smaller both in terms of length and width. Aggressive debridement with a curette afterwards the tissue appears healthier. Left foot wound appears improved in the area on the dorsal right second toe is improved 08/30/16 patient developed a fever over the weekend and was seen in an urgent care. Felt to have a UTI and put on doxycycline. He has been since changed over the phone to [facility]. After we took off the wrap on his right leg today the leg is swollen warm and erythematous, probably more likely the source of the fever 09/06/16; have been using collagen to the major left leg wound, silver alginate to the area on his anterior foot/toes 09/13/16; the areas on his anterior foot/toes on both sides appear to be virtually closed. Extensive wound on the left lateral leg perhaps slightly narrower but each visit still covered an adherent surface slough 09/16/16 patient was in for his usual Thursday nurse visit however the intake nurse noted significant erythema of his dorsal right foot. He is also running a low- grade fever and having increasing spasms in the right leg 09/20/16 here for cellulitis involving his right great toes and forefoot. This is a lot better. Still requiring debridement on his left lateral leg. Santyl direct says he needs prior authorization. Therefore his wife cannot change this at home 09/30/16; the patient's extensive area on the left lateral calf and ankle perhaps somewhat better. Using Santyl. The area on the left toes is healed and I think the area on his right dorsal foot is healed as well. There is no cellulitis or venous inflammation involving the right leg. He is going to need  compression stockings here. 10/07/16; the patient's extensive wound on the left lateral calf and ankle does not measure any differently however there appears to be less adherent surface slough using Santyl and aggressive weekly debridements 10/21/16; no major change in the area on the left lateral calf. Still the same measurement still very difficult to debridement adherent slough and nonviable subcutaneous tissue. This is not really been helped by several weeks of Santyl. Previously for 2 weeks I used Iodoflex for a short period. A prolonged course of Hydrofera Blue didn't really help. I'm not sure why I only used 2 weeks of Iodoflex on this there is no evidence of surrounding infection. He has a small area on the right second toe which looks as though it's progressing towards closure 10/28/16; the wounds on his toes appear to be closed. No major change in the left lateral leg  wound although the surface looks somewhat better using Iodoflex. He has had previous arterial studies that were normal. He has had reflux studies and is status post ablation although I don't have any exact notes on which vein was ablated. I'll need to check the surgical record 11/04/16; he's had a reopening between the first and second toe on the left and right. No major change in the left lateral leg wound. There is what appears to be cellulitis of the left dorsal foot 11/18/16 the patient was hospitalized initially in [place] and then subsequently transferred to [facility] and was admitted there from 11/09/16 through 11/12/16. He had developed progressive cellulitis on the right leg in spite of the doxycycline I gave him. I'd spoken to the hospitalist in [place] who was concerned about continuing leukocytosis. CT scan is what I suggested this was done which showed soft tissue swelling without evidence of osteomyelitis or an underlying abscess blood cultures were negative. At [facility] he was treated with vancomycin and  Primaxin and then add an infectious disease consult. He was transitioned to Ceftaroline. He has been making progressive improvement. Overall a severe cellulitis of the right leg. He is been using silver alginate to her original wound on the left leg. The wounds in his toes on the right are closed there is a small open area on the base of the left second toe 11/26/15; the patient's right leg is much better although there is still some edema here this could be reminiscent from his severe cellulitis likely on top of some degree of lymphedema. His left anterior leg wound has less surface slough as reported by her intake nurse. Small wound at the base of the left second toe 12/02/16; patient's right leg is better and there is no open wound here. His left anterior lateral leg wound continues to have a healthy-looking surface. Small wound at the base of the left second toe however there is erythema in the left forefoot which is worrisome 12/16/16; is no open wounds on his right leg. We took measurements for stockings. His left anterior lateral leg wound continues to have a healthy-looking surface. I'm not sure where we were with the Apligraf run through his insurance. We have been using Iodoflex. He has a thick eschar on the left first second toe interface, I suspect this may be fungal however there is no visible open 12/23/16; no open wound on his right leg. He has 2 small areas left of the linear wound that was remaining last week. We have been using Prisma, I thought I have disclosed this week, we can only look forward to next week 01/03/17; the patient had concerning areas of erythema last week, already on doxycycline for UTI through his primary doctor. The erythema is absolutely no better there is warmth and swelling both medially from the left lateral leg wound and also the dorsal left foot. 01/06/17- Patient is here for follow-up evaluation of his left lateral leg ulcer and bilateral feet ulcers. He is on oral  antibiotic therapy, tolerating that. Nursing staff and the patient states that the erythema is improved from Monday. 01/13/17; the predominant left lateral leg wound continues to be problematic. I had put Apligraf on him earlier this month once. However he subsequently developed what appeared to be an intense cellulitis around the left lateral leg wound. I gave him Dalvance I think on 2/12 perhaps 2/13 he continues on cefdinir. The erythema is still present but the warmth and swelling is improved. I am  hopeful that the cellulitis part of this control. I wouldn't be surprised if there is an element of venous inflammation as well. 01/17/17. The erythema is present but better in the left leg. His left lateral leg wound still does not have a viable surface buttons certain parts of this long thin wound it appears like there has been improvement in dimensions. 01/20/17; the erythema still present but much better in the left leg. I'm thinking this is his usual degree of chronic venous inflammation. The wound on the left leg looks somewhat better. Is less surface slough 01/27/17; erythema is back to the chronic venous inflammation. The wound on the left leg is somewhat better. I am back to the point where I like to try an Apligraf once again 02/10/17; slight improvement in wound dimensions. Apligraf #2. He is completing his doxycycline 02/14/17; patient arrives today having completed doxycycline last Thursday. This was supposed to be a nurse visit however once again he hasn't tense erythema from the medial part of his wound extending over the lower leg. Also erythema in his foot this is roughly in the same distribution as last time. He has baseline chronic venous inflammation however this is a lot worse than the baseline I have learned to accept the on him is baseline inflammation 02/24/17- patient is here for follow-up evaluation. He is tolerating compression therapy. His voicing no complaints or concerns he is here  anticipating an Apligraf 03/03/17; he arrives today with an adherent necrotic surface. I don't think this is surface is going to be amenable for Apligraf's. The erythema around his wound and on the left dorsal foot has resolved he is off antibiotics 03/10/17; better-looking surface today. I don't think he can tolerate Apligraf's. He tells me he had a wound VAC after a skin graft years ago to this area and they had difficulty with a seal. The erythema continues to be stable around this some degree of chronic venous inflammation but he also has recurrent cellulitis. We have been using Iodoflex 03/17/17; continued improvement in the surface and may be small changes in dimensions. Using Iodoflex which seems the only thing that will control his surface 03/24/17- He is here for follow up evaluation of his LLE lateral ulceration and ulcer to right dorsal foot/toe space. He is voicing no complaints or concerns, He is tolerating compression wrap. 03/31/17 arrives today with a much healthier looking wound on the left lower extremity. We have been using Iodoflex for a prolonged period of time which has for the first time prepared and adequate looking wound bed although we have not had much in the way of wound dimension improvement. He also has a small wound between the first and second toe on the right 04/07/17; arrives today with a healthy-looking wound bed and at least the top 50% of this wound appears to be now her. No debridement was required I have changed him to [facility] last week after prolonged Iodoflex. He did not do well with Apligraf's. We've had a re-opening between the first and second toe on the right 04/14/17; arrives today with a healthier looking wound bed contractions and the top 50% of this wound and some on the lesser 50%. Wound bed appears healthy. The area between the first and second toe on the right still remains problematic 04/21/17; continued very gradual improvement. Using [facility] 04/28/17; continued very gradual improvement in the left lateral leg venous insufficiency wound. His periwound erythema is very mild. We have been using Hydrofera Blue.  Wound is making progress especially in the superior 50% 05/05/17; he continues to have very gradual improvement in the left lateral venous insufficiency wound. Both in terms with an length rings are improving. I debrided this every 2 weeks with #5 curet and we have been using Hydrofera Blue and again making good progress With regards to the wounds between his right first and second toe which I thought might of been tinea pedis he is not making as much progress very dry scaly skin over the area. Also the area at the base of the left first and second toe in a similar condition 05/12/17; continued gradual improvement in the refractory left lateral venous insufficiency wound on the left. Dimension smaller. Surface still requiring debridement using Hydrofera Blue 05/19/17; continued gradual improvement in the refractory left lateral venous ulceration. Careful inspection of the wound bed underlying rumination suggested some degree of epithelialization over the surface no debridement indicated. Continue Hydrofera Blue difficult areas between his toes first and third on the left than first and second on the right. I'm going to change to silver alginate from silver collagen. Continue ketoconazole as I suspect underlying tinea pedis 05/26/17; left lateral leg venous insufficiency wound. We've been using Hydrofera Blue. I believe that there is expanding epithelialization over the surface of the wound albeit not coming from the wound circumference. This is a bit of an odd situation in which the epithelialization seems to be coming from the surface of the wound rather than in the exact circumference. There is still small open areas mostly along the lateral margin of the wound. ooHe has unchanged areas between the left first and second and the right  first second toes which I been treating for tenia pedis 06/02/17; left lateral leg venous insufficiency wound. We have been using Hydrofera Blue. Somewhat smaller from the wound circumference. The surface of the wound remains a bit on it almost epithelialized sedation in appearance. I use an open curette today debridement in the surface of all of this especially the edges ooSmall open wounds remaining on the dorsal right first and second toe interspace and the plantar left first second toe and her face on the left 06/09/17; wound on the left lateral leg continues to be smaller but very gradual and very dry surface using Hydrofera Blue 06/16/17 requires weekly debridements now on the left lateral leg although this continues to contract. I changed to silver collagen last week because of dryness of the wound bed. Using Iodoflex to the areas on his first and second toes/web space bilaterally 06/24/17; patient with history of paraplegia also chronic venous insufficiency with lymphedema. Has a very difficult wound on the left lateral leg. This has been gradually reducing in terms of with but comes in with a very dry adherent surface. High switch to silver collagen a week or so ago with hydrogel to keep the area moist. This is been refractory to multiple dressing attempts. He also has areas in his first and second toes bilaterally in the anterior and posterior web space. I had been using Iodoflex here after a prolonged course of silver alginate with ketoconazole was ineffective [question tinea pedis] 07/14/17; patient arrives today with a very difficult adherent material over his left lateral lower leg wound. He also has surrounding erythema and poorly controlled edema. He was switched his Santyl last visit which the nurses are applying once during his doctor visit and once on a nurse visit. He was also reduced to 2 layer compression I'm not exactly sure  of the issue here. 07/21/17; better surface today after 1  week of Iodoflex. Significant cellulitis that we treated last week also better. [Doxycycline] 07/28/17 better surface today with now 2 weeks of Iodoflex. Significant cellulitis treated with doxycycline. He has now completed the doxycycline and he is back to his usual degree of chronic venous inflammation/stasis dermatitis. He reminds me he has had ablations surgery here 08/04/17; continued improvement with Iodoflex to the left lateral leg wound in terms of the surface of the wound although the dimensions are better. 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 Problematic areas on the plantar aspect of the first second toe web space on the left and the dorsal aspect of the first second toe web space on the right. At one point I felt these were probably related to chronic fungal infections in treated him aggressively for this although we have not made any improvement here. 08/11/17; left lateral leg. Surface continues to improve with the Iodoflex although we are not seeing much improvement in overall wound dimensions. Areas on his plantar left foot and right foot show no improvement. In fact the right foot looks somewhat worse 08/18/17; left lateral leg. We changed to [facility] Blue last week after a prolonged course of Iodoflex which helps get the surface better. It appears that the wound with is improved. Continue with difficult areas on the left dorsal first second and plantar first second on the right 09/01/17; patient arrives in clinic today having had a temperature of 103 yesterday. He was seen in the ER and [facility]. The patient was concerned he could have cellulitis again in the right leg however they diagnosed him with a UTI and he is now on Keflex. He has a history of cellulitis which is been recurrent and difficult but this is been in the left leg, in the past 5 use doxycycline. He does in and out catheterizations at home which  are risk factors for UTI 09/08/17; patient will be completing his Keflex this weekend. The erythema on the left leg is considerably better. He has a new wound today on the medial part of the right leg small superficial almost looks like a skin tear. He has worsening of the area on the right dorsal first and second toe. His major area on the left lateral leg is better. Using Hydrofera Blue on all areas 09/15/17; gradual reduction in width on the long wound in the left lateral leg. No debridement required. He also has wounds on the plantar aspect of his left first second toe web space and on the dorsal aspect of the right first second toe web space. 09/22/17; there continues to be very gradual improvements in the dimensions of the left lateral leg wound. He hasn't round erythematous spot with might be pressure on his wheelchair. There is no evidence obviously of infection no purulence no warmth ooHe has a dry scaled area on the plantar aspect of the left first second toe ooImproved area on the dorsal right first second toe. 09/29/17; left lateral leg wound continues to improve in dimensions mostly with an is still a fairly long but increasingly narrow wound. ooHe has a dry scaled area on the plantar aspect of his left first second toe web space ooIncreasingly concerning area on the dorsal right first second toe. In fact I am concerned today about possible cellulitis around this wound. The areas extending up his second toe and although there is deformities here almost  appears to abut on the nailbed. 10/06/17; left lateral leg wound continues to make very gradual progress. Tissue culture I did from the right first second toe dorsal foot last time grew MRSA and enterococcus which was vancomycin sensitive. This was not sensitive to clindamycin or doxycycline. He is allergic to Zyvox and sulfa we have therefore arrange for him to have dalvance infusion tomorrow. He is had this in the past and tolerated it  well 10/20/17; left lateral leg wound continues to make decent progress. This is certainly reduced in terms of with there is advancing epithelialization.ooThe cellulitis in the right foot looks better although he still has a deep wound in the dorsal aspect of the first second toe web space. Plantar left first toe web space on the left I think is making some progress 10/27/17; left lateral leg wound continues to make decent progress. Advancing epithelialization.using Hydrofera Blue ooThe right first second toe web space wound is better-looking using silver alginate ooImprovement in the left plantar first second toe web space. Again using silver alginate 11/03/17 left lateral leg wound continues to make decent progress albeit slowly. Using Hydrofera Blue ooThe right per second toe web space continues to be a very problematic looking punched out wound. I obtained a piece of tissue for deep culture I did extensively treated this for fungus. It is difficult to imagine that this is a pressure area as the patient states other than going outside he doesn't really wear shoes at home ooThe left plantar first second toe web space looked fairly senescent. Necrotic edges. This required debridement oochange to Hydrofera Blue to all wound areas 11/10/17; left lateral leg wound continues to contract. Using Hydrofera Blue ooOn the right dorsal first second toe web space dorsally. Culture I did of this area last week grew MRSA there is not an easy oral option in this patient was multiple antibiotic allergies or intolerances. This was only a rare culture isolate I'm therefore going to use Bactroban under silver alginate ooOn the left plantar first second toe web space. Debridement is required here. This is also unchanged 11/17/17; left lateral leg wound continues to contract using Hydrofera Blue this is no longer the major issue. ooThe major concern here is the right first second toe web space. He now has an  open area going from dorsally to the plantar aspect. There is now wound on the inner lateral part of the first toe. Not a very viable surface on this. There is erythema spreading medially into the forefoot. ooNo major change in the left first second toe plantar wound 11/24/17; left lateral leg wound continues to contract using Hydrofera Blue. Nice improvement today ooThe right first second toe web space all of this looks a lot less angry than last week. I have given him clindamycin and topical Bactroban for MRSA and terbinafine for the possibility of underlining tinea pedis that I could not control with ketoconazole. Looks somewhat better ooThe area on the plantar left first second toe web space is weeping with dried debris around the wound 12/01/17; left lateral leg wound continues to contract he Hydrofera Blue. It is becoming thinner in terms of with nevertheless it is making good improvement. ooThe right first second toe web space looks less angry but still a large necrotic-looking wounds starting on the plantar aspect of the right foot extending between the toes and now extensively on the base of the right second toe. I gave him clindamycin and topical Bactroban for MRSA anterior benefiting for the possibility  of underlying tinea pedis. Not looking better today ooThe area on the left first/second toe looks better. Debrided of necrotic debris 12/05/17* the patient was worked in urgently today because over the weekend he found blood on his incontinence bad when he woke up. He was found to have an ulcer by his wife who does most of his wound care. He came in today for [place] to look at this. He has not had a history of wounds in his buttocks in spite of his paraplegia. 12/08/17; seen in follow-up today at his usual appointment. He was seen earlier this week and found to have a new wound on his buttock. We also follow him for wounds on the left lateral leg, left first second toe web space and right first  second toe web space 12/15/17; we have been using Hydrofera Blue to the left lateral leg which has improved. The right first second toe web space has also improved. Left first second toe web space plantar aspect looks stable. The left buttock has worsened using Santyl. Apparently the buttock has drainage 12/22/17; we have been using Hydrofera Blue to the left lateral leg which continues to improve now 2 small wounds separated by normal skin. He tells [place] he had a fever up to 100 yesterday he is prone to UTIs but has not noted anything different. He does in and out catheterizations. The area between the first and second toes today does not look good necrotic surface covered with what looks to be purulent drainage and erythema extending into the third toe. I had gotten this to something that I thought look better last time however it is not look good today. He also has a necrotic surface over the buttock wound which is expanded. I thought there might be infection under here so I removed a lot of the surface with a #5 curet though nothing look like it really needed culturing. He is been using Santyl to this area 12/27/17; his original wound on the left lateral leg continues to improve using Hydrofera Blue. I gave him samples of Baxdella although he was unable to take them out of fear for an allergic reaction ["lump in his throat"].the culture I did of the purulent drainage from his second toe last week showed both enterococcus and a set Enterobacter I was also concerned about the erythema on the bottom of his foot although paradoxically although this looks somewhat better today. 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 We've been using silver alginate here. They came up today that he sleeps in a recliner, I'm not sure why but I asked him to stop this 01/03/18; his original wound we've been using Hydrofera Blue is now  separated into 2 areas. ooUlcer on his left buttock is better he is off the recliner and sleeping in bed ooFinally both wound areas between his first and second toes also looks some better 01/10/18; his original wound on the left lateral leg is now separated into 2 wounds we've been using Hydrofera Blue ooUlcer on his left buttock has some drainage. There is a small probing site going into muscle layer superiorly.using silver alginate -He arrives today with a deep tissue injury on the left heel ooThe wound on the dorsal aspect of his first second toe on the left looks a lot betterusing silver alginate ketoconazole ooThe area on the first second toe web space on the right also looks a lot  bette 01/17/18; his original wound on the left lateral leg continues to progress using Hydrofera Blue ooUlcer on his left buttock also is smaller surface healthier except for a small probing site going into the muscle layer superiorly. 2.4 cm of tunneling in this area ooDTI on his left heel we have only been offloading. Looks better than last week no threatened open no evidence of infection oothe wound on the dorsal aspect of the first second toe on the left continues to look like it's regressing we have only been using silver alginate and terbinafine orally ooThe area in the first second toe web space on the right also looks to be a lot better using silver alginate and terbinafine I think this was prompted by tinea pedis 01/31/18; the patient was hospitalized in [place] last week apparently for a complicated UTI. He was discharged on cefepime he does in and out catheterizations. In the hospital he was discovered M I don't mild elevation of AST and ALT and the terbinafine was stopped.predictably the pressure ulcer on s his buttock looks betterusing silver alginate. The area on the left lateral leg also is better using Hydrofera Blue. The area between the first and second toes on the left better. First and  second toes on the right still substantial but better. Finally the DTI on the left heel has held together and looks like it's resolving 02/07/18-he is here in follow-up evaluation for multiple ulcerations. He has new injury to the lateral aspect of the last issue a pressure ulcer, he states this is from adhesive removal trauma. He states he has tried multiple adhesive products with no success. All other ulcers appear stable. The left heel DTI is resolving. We will continue with same treatment plan and follow-up next week. 02/14/18; follow-up for multiple areas. ooHe has a new area last week on the lateral aspect of his pressure ulcer more over the posterior trochanter. The original pressure ulcer looks quite stable has healthy granulation. 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 Almost fully epithelialized on the original superior area using Hydrofera Blue ooDTI on the left heel has peeled off this week to reveal a small superficial wound under denuded skin and subcutaneous tissue ooBoth areas between the first and second toes look better including nothing open on the left 02/21/18; ooThe patient's wounds on his left ischial tuberosity and posterior left greater trochanter actually looked better. He has a large area of irritation around the area which I think is contact dermatitis. I am doubtful that this is fungal ooHis original wound on the left lateral calf continues to improve we have been using Hydrofera Blue ooThere is no open area in the left first second toe web space although there is a lot of thick callus ooThe DTI on the left heel required debridement today of necrotic surface eschar and subcutaneous tissue using silver alginate ooFinally the area on the right first second toe webspace continues to contract using silver alginate and ketoconazole 02/28/18 ooLeft ischial  tuberosity wounds look better using silver alginate. ooOriginal wound on the left calf only has one small open area left using Hydrofera Blue ooDTI on the left heel required debridement mostly removing skin from around this wound surface. Using silver alginate ooThe areas on the right first/second toe web space using silver alginate and ketoconazole 03/08/18 on evaluation today patient appears to be doing decently well as best I can tell in  regard to his wounds. This is the first time that I have seen him as he generally is followed by Dr. Dellia Nims. With that being said none of his wounds appear to be infected he does have an area where there is some skin covering what appears to be a new wound on the left dorsal surface of his great toe. This is right at the nail bed. With that being said I do believe that debrided away some of the excess skin can be of benefit in this regard. Otherwise he has been tolerating the dressing changes without complication. 03/14/18; patient arrives today with the multiplicity of wounds that we are following. He has not been systemically unwell ooOriginal wound on the left lateral calf now only has 2 small open areas we've been using Hydrofera Blue which should continue ooThe deep tissue injury on the left heel requires debridement today. We've been using silver alginate ooThe left first second toe and the right first second toe are both are reminiscence what I think was tinea pedis. Apparently some of the callus Surface between the toes was removed last week when it started draining. ooPurulent drainage coming from the wound on the ischial tuberosity on the left. 03/21/18-He is here in follow-up evaluation for multiple wounds. 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 He tolerated debridement. The only change to last week's recommendations is to discontinue antifungal cream between toes.  He will follow-up next week 03/28/18; following up for multiple wounds;Concern this week is streaking redness and swelling in the right foot. He is going to need antibiotics for this. 03/31/18; follow-up for right foot cellulitis. Streaking redness and swelling in the right foot on 03/28/18. 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 I put him on clindamycin 300 mg every 6 and brought him in for a quick check. He has an open wound between his first and second toes on the right foot as a potential source. 04/04/18; ooRight foot cellulitis is resolving he is completing clindamycin. This is truly good news ooLeft lateral calf wound which is initial wound only has one small open area inferiorly this is close to healing out. He has compression stockings. We will use Hydrofera Blue right down to the epithelialization of this ooNonviable surface on the left heel which was initially pressure with a DTI. We've been using Hydrofera Blue. I'm going to switch this back to silver alginate ooLeft first second toe/tinea pedis this looks better using silver alginate ooRight first second toe tinea pedis using silver alginate ooLarge pressure ulcers on theLeft ischial tuberosity. Small wound here Looks better. I am uncertain about the surface over the large wound. Using silver alginate 04/11/18; ooCellulitis in the right foot is resolved ooLeft lateral calf wound which was his original wounds still has 2 tiny open areas remaining this is just about closed ooNonviable surface on the left heel is better but still requires debridement ooLeft first second toe/tinea pedis still open using silver alginate ooRight first second toe wound tinea pedis I asked him to go back to using ketoconazole and silver alginate ooLarge pressure ulcers on the left ischial tuberosity this shear injury here is resolved. Wound is smaller. No evidence of infection using silver alginate 04/18/18; ooPatient arrives  with an intense area of cellulitis in the right mid lower calf extending into the right heel area. Bright red and warm. Smaller area on the left anterior leg. He has a significant history of  MRSA. He will definitely need antibioticsoodoxycycline ooHe now has 2 open areas on the left ischial tuberosity the original large wound and now a satellite area which I think was above his initial satellite areas. Not a wonderful surface on this satellite area surrounding erythema which looks like pressure related. ooHis left lateral calf wound again his original wound is just about closed ooLeft heel pressure injury still requiring debridement ooLeft first second toe looks a lot better using silver alginate ooRight first second toe also using silver alginate and ketoconazole cream also looks better 04/20/18; the patient was worked in early today out of concerns with his cellulitis on the right leg. I had started him on doxycycline. This was 2 days ago. His wife was concerned about the swelling in the area. Also concerned about the left buttock. He has not been systemically unwell no fever chills. No nausea vomiting or diarrhea 04/25/18; the patient's left buttock wound is continued to deteriorate he is using Hydrofera Blue. He is still completing clindamycin for the cellulitis on the right leg although all of this looks better. 05/02/18 ooLeft buttock wound still with a lot of drainage and a very tightly adherent fibrinous necrotic surface. He has a deeper area superiorly ooThe left lateral calf wound is still closed ooDTI wound on the left heel necrotic surface especially the circumference using Iodoflex ooAreas between his left first second toe and right first second toe both look better. Dorsally and the right first second toe he had a necrotic surface although at smaller. In using silver alginate and ketoconazole. I did a culture last week which was a deep tissue culture of the reminiscence of the  open wound on the right first second toe dorsally. This grew a few Acinetobacter and a few methicillin-resistant staph aureus. Nevertheless the area actually this week looked better. I didn't feel the need to specifically address this at least in terms of systemic antibiotics. 05/09/18; wounds are measuring larger more drainage per our intake. We are using Santyl covered with alginate on the large superficial buttock wounds, Iodosorb on the left heel, ketoconazole and silver alginate to the dorsal first and second toes bilaterally. 05/16/18; ooThe area on his left buttock better in some aspects although the area superiorly over the ischial tuberosity required an extensive debridement.using Santyl ooLeft heel appears stable. Using Iodoflex ooThe areas between his first and second toes are not bad however there is spreading erythema up the dorsal aspect of his left foot this looks like cellulitis again. He is insensate the erythema is really very brilliant.o Erysipelas He went to see an allergist days ago because he was itching part of this he had lab work done. This showed a white count of 15.1 with 70% neutrophils. Hemoglobin of 11.4 and a platelet count of 659,000. Last white count we had in Epic was a 2-1/2 years ago which was 25.9 but he was ill at the time. He was able to show me some lab work that was done by his primary physician the pattern is about the same. 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 But prompted me to go ahead and do x-rays of both feet and the pelvis rule out osteomyelitis. He also had a comprehensive metabolic panel this was reasonably normal his albumin was 3.7 liver function tests BUN/creatinine all normal 05/23/18; x-rays of both his feet from last week were negative for underlying pulmonary abnormality. The x-ray of his pelvis however showed mild  irregularity in the left ischial which may represent some early osteomyelitis.  The wound in the left ischial continues to get deeper clearly now exposed muscle. Each week necrotic surface material over this area. Whereas the rest of the wounds do not look so bad. ooThe left ischial wound we have been using Santyl and calcium alginate ooT the left heel surface necrotic debris using Iodoflex o ooThe left lateral leg is still healed ooAreas on the left dorsal foot and the right dorsal foot are about the same. There is some inflammation on the left which might represent contact dermatitis, fungal dermatitis I am doubtful cellulitis although this looks better than last week 05/30/18; CT scan done at Hospital did not show any osteomyelitis or abscess. Suggested the possibility of underlying cellulitis although I don't see a lot of evidence of this at the bedside ooThe wound itself on the left buttock/upper thigh actually looks somewhat better. No debridement ooLeft heel also looks better no debridement continue Iodoflex ooBoth dorsal first second toe spaces appear better using Lotrisone. Left still required debridement 06/06/18; ooIntake reported some purulent looking drainage from the left gluteal wound. Using Santyl and calcium alginate ooLeft heel looks better although still a nonviable surface requiring debridement ooThe left dorsal foot first/second webspace actually expanding and somewhat deeper. I may consider doing a shave biopsy of this area ooRight dorsal foot first/second webspace appears stable to improved. Using Lotrisone and silver alginate to both these areas 06/13/18 ooLeft gluteal surface looks better. Now separated in the 2 wounds. No debridement required. Still drainage. We'll continue silver alginate ooLeft heel continues to look better with Iodoflex continue this for at least another week ooOf his dorsal foot wounds the area on the left still has some depth although it looks better than last week. We've been using Lotrisone and silver  alginate 06/20/18 ooLeft gluteal continues to look better healthy tissue ooLeft heel continues to look better healthy granulation wound is smaller. He is using Iodoflex and his long as this continues continue the Iodoflex ooDorsal right foot looks better unfortunately dorsal left foot does not. There is swelling and erythema of his forefoot. He had minor trauma to this several days ago but doesn't think this was enough to have caused any tissue injury. Foot looks like cellulitis, we have had this problem before 06/27/18 on evaluation today patient appears to be doing a little worse in regard to his foot ulcer. 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 Both of which would really be his only options for treating this infection. In the past he has been given and effusion of Orbactiv. This is done very well for him in the past again it's one time dosing IV antibiotic therapy. Subsequently I do believe this is something we're gonna need to see about doing at this point in time. Currently his other wounds seem to be doing somewhat better in my pinion I'm pretty happy in that regard. 07/03/18 on evaluation today patient's wounds actually appear to be doing fairly well. He has been tolerating the dressing changes without complication. All in all he seems to be showing signs of improvement. In regard to the antibiotics he has been dealing with infectious disease since I saw him last week as far as getting this scheduled. In the end [facility]'s [facility] this coming Friday. In the meantime he has been continuing to perform the dressing changes in such as previous. There does not appear to be any evidence of infection worsengin at this time. 07/10/18; ooSince I last saw this man 2 weeks ago things have actually  improved. IV antibiotics of resulted in less forefoot erythema although there is still some present. He is not systemically unwell ooLeft buttock wounds o2 now have no depth there is increased epithelialization Using silver alginate ooLeft heel still requires debridement using Iodoflex ooLeft dorsal foot still with a sizable wound about the size of a border but healthy granulation ooRight dorsal foot still with a slitlike area using silver alginate 07/18/18; the patient's cellulitis in the left foot is improved in fact I think it is on its way to resolving. ooLeft buttock wounds o2 both look better although the larger one has hypertension granulation we've been using silver alginate ooLeft heel has some thick circumferential redundant skin over the wound edge which will need to be removed today we've been using Iodoflex ooLeft dorsal foot is still a sizable wound required debridement using silver alginate ooThe right dorsal foot is just about closed only a small open area remains here 07/25/18; left foot cellulitis is resolved ooLeft buttock wounds o2 both look better. Hyper-granulation on the major area ooLeft heel as some debris over the surface but otherwise looks a healthier wound. Using silver collagen ooRight dorsal foot is just about closed 07/31/18; arrives with our intake nurse worried about purulent drainage from the buttock. We had hyper-granulation here last week ooHis buttock wounds o2 continue to look better ooLeft heel some debris over the surface but measuring smaller. ooRight dorsal foot unfortunately has openings between the toes ooLeft foot superficial wound looks less aggravated. 08/07/18 ooButtock wounds continue to look better although some of her granulation and the larger medial wound. silver alginate ooLeft heel continues to look a lot better.silver collagen ooLeft foot superficial wound looks less stable. Requires debridement. He has a new wound  superficial area on the foot on the lateral dorsal foot. ooRight foot looks better using silver alginate without Lotrisone 08/14/2018; patient was in the ER last week diagnosed with a UTI. He is now on Cefpodoxime and Macrodantin. ooButtock wounds continued to be smaller. Using silver alginate ooLeft heel continues to look better using silver collagen ooLeft foot superficial wound looks as though it is improving ooRight dorsal foot area is just about healed. 08/21/2018; patient is completed his antibiotics for his UTI. ooHe has 2 open areas on the buttocks. There is still not closed although the surface looks satisfactory. Using silver alginate ooLeft heel continues to improve using silver collagen ooThe bilateral dorsal foot areas which are at the base of his first and second toes/possible tinea pedis are actually stable on the left but worse on the right. The area on the left required debridement of necrotic surface. After debridement I obtained a specimen for PCR culture. ooThe right dorsal foot which is been just about healed last week is now reopened 08/28/2018; culture done on the left dorsal foot showed coag negative staph both staph epidermidis and Lugdunensis. I think this is worthwhile initiating systemic treatment. I will use doxycycline given his long list of allergies. The area on the left heel slightly improved but still requiring debridement. ooThe large wound on the buttock is just about closed whereas the smaller one is larger. Using silver alginate in this area 09/04/2018; patient is completing his doxycycline for the  left foot although this continues to be a very difficult wound area with very adherent necrotic debris. We are using silver alginate to all his wounds right foot left foot and the small wounds on his buttock, silver collagen on the left heel. 09/11/2018; once again this patient has intense erythema and swelling of the left forefoot. Lesser degrees of erythema  in the right foot. He has a long list of allergies and intolerances. I will reinstitute doxycycline. 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 Using silver alginate ooLeft heel also looks better using silver collagen ooUnfortunately both the areas on his feet look worse. The area on the left first second webspace is now gone through to the plantar part of his foot. The area on the left foot anteriorly is irritated with erythema and swelling in the forefoot. 09/25/2018 ooHis wound on the left plantar heel looks better. Using silver collagen ooThe area on the left buttock 2 small remnant areas. One is closed one is still open. Using silver alginate ooThe areas between both his first and second toes look worse. This in spite of long-standing antifungal therapy with ketoconazole and silver alginate which should have antifungal activity ooHe has small areas around his original wound on the left calf one is on the bottom of the original scar tissue and one superiorly both of these are small and superficial but again given wound history in this site this is worrisome 10/02/2018 ooLeft plantar heel continues to gradually contract using silver collagen ooLeft buttock wound is unchanged using silver alginate ooThe areas on his dorsal feet between his first and second toes bilaterally look about the same. 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 Small open area here. We have been using silver alginate 10/09/2018; ooLeft plantar heel continues to look healthy and contract. No debridement is required ooLeft buttock slightly smaller with a tape injury wound just below which was new this week ooDorsal feet somewhat improved I have been using clindamycin ooLeft  lateral looks lower extremity the actual open area looks worse although a lot of this is epithelialized. I am going to change to silver collagen today He has a lot more swelling in the right leg although this is not pitting not red and not particularly warm there is a lot of spasm in the right leg usually indicative of people with paralysis of some underlying discomfort. We have reviewed his vascular status from 2017 he had a left greater saphenous vein ablation. I wonder about referring him back to vascular surgery if the area on the left leg continues to deteriorate. 10/16/2018 in today for follow-up and management of multiple lower extremity ulcers. His left Buttock wound is much lower smaller and almost closed completely. The wound to the left ankle has began to reopen with Epithelialization and some adherent slough. He has multiple new areas to the left foot and leg. The left dorsal foot without much improvement. Wound present between left great webspace and 2nd toe. Erythema and edema present right leg. Right LE ultrasound obtained on 10/10/18 was negative for DVT . 10/23/2018; ooLeft buttock is closed over. Still dry macerated skin but there is no open wound. I suspect this is chronic pressure/moisture ooLeft lateral calf is quite a bit worse than when I saw this last.  There is clearly drainage here he has macerated skin into the left plantar heel. We will change the primary dressing to alginate ooLeft dorsal foot has some improvement in overall wound area. Still using clindamycin and silver alginate ooRight dorsal foot about the same as the left using clindamycin and silver alginate ooThe erythema in the right leg has resolved. He is DVT rule out was negative ooLeft heel pressure area required debridement although the wound is smaller and the surface is health 10/26/2018 ooThe patient came back in for his nurse check today predominantly because of the drainage coming out of the left  lateral leg with a recent reopening of his original wound on the left lateral calf. He comes in today with a large amount of surrounding erythema around the wound extending from the calf into the ankle and even in the area on the dorsal foot. He is not systemically unwell. He is not febrile. Nevertheless this looks like cellulitis. We have been using silver alginate to the area. I changed him to a regular visit and I am going to prescribe him doxycycline. The rationale here is a long list of medication intolerances and a history of MRSA. I did not see anything that I thought would provide a valuable culture 10/30/2018 ooFollow-up from his appointment 4 days ago with really an extensive area of cellulitis in the left calf left lateral ankle and left dorsal foot. I put him on doxycycline. He has a long list of medication allergies which are true allergy reactions. 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 In any case he is a lot better today. The erythema especially in the anterior and lateral left calf is better. He still has left ankle erythema. He also is complaining about increasing edema in the right leg we have only been using Kerlix Coban and he has been doing the wraps at home. Finally he has a spotty rash on the medial part of his upper left calf which looks like folliculitis or perhaps wrap occlusion type injury. Small superficial macules not pustules 11/06/18 patient arrives today with again a considerable degree of erythema around the wound on the left lateral calf extending into the dorsal ankle and dorsal foot. This is a lot worse than when I saw this last week. He is on doxycycline really with not a lot of improvement. He has not been systemically unwell Wounds on the; left heel actually looks improved. Original area on the left foot and proximity to the first and second toes looks about the same. He has superficial  areas on the dorsal foot, anterior calf and then the reopening of his original wound on the left lateral calf which looks about the same ooThe only area he has on the right is the dorsal webspace first and second which is smaller. ooHe has a large area of dry erythematous skin on the left buttock small open area here. 11/13/2018; the patient arrives in much better condition. The erythema around the wound on the left lateral calf is a lot better. Not sure whether this was the clindamycin or the TCA and ketoconazole or just in the improvement in edema control [stasis dermatitis]. In any case this is a lot better. The area on the left heel is very small and just about resolved using silver collagen we have been using silver alginate to the areas on his dorsal feet 11/20/2018; his wounds include the left lateral calf, left heel, dorsal  aspects of both feet just proximal to the first second webspace. He is stable to slightly improved. I did not think any changes to his dressings were going to be necessary 11/27/2018 he has a reopening on the left buttock which is surrounded by what looks like tinea or perhaps some other form of dermatitis. The area on the left dorsal foot has some erythema around it I have marked this area but I am not sure whether this is cellulitis or not. Left heel is not closed. Left calf the reopening is really slightly longer and probably worse 1/13; in general things look better and smaller except for the left dorsal foot. Area on the left heel is just about closed, left buttock looks better only a small wound remains in the skin looks better [using Lotrisone] 1/20; the area on the left heel only has a few remaining open areas here. Left lateral calf about the same in terms of size, left dorsal foot slightly larger right lateral foot still not closed. The area on the left buttock has no open wound and the surrounding skin looks a lot better 1/27; the area on the left heel is closed.  Left lateral calf better but still requiring extensive debridements. The area on his left buttock is closed. He still has the open areas on the left dorsal foot which is slightly smaller in the right foot which is slightly expanded. We have been using Iodoflex on these areas as well 2/3; left heel is closed. Left lateral calf still requiring debridement using Iodoflex there is no open area on his left buttock however he has dry scaly skin over a large area of this. Not really responding well to the Lotrisone. Finally the areas on his dorsal feet at the level of the first second webspace are slightly smaller on the right and about the same on the left. Both of these vigorously debrided with Anasept and gauze 2/10; left heel remains closed he has dry erythematous skin over the left buttock but there is no open wound here. Left lateral leg has come in and with. Still requiring debridement we have been using Iodoflex here. Finally the area on the left dorsal foot and right dorsal foot are really about the same extremely dry callused fissured areas. He does not yet have a dermatology appointment 2/17; left heel remains closed. He has a new open area on the left buttock. The area on the left lateral calf is bigger longer and still covered in necrotic debris. No major change in his foot areas bilaterally. I am awaiting for a dermatologist to look on this. We have been using ketoconazole I do not know that this is been doing any good at all. 2/24; left heel remains closed. The left buttock wound that was new reopening last week looks better. The left lateral calf appears better also although still requires debridement. The major area on his foot is the left first second also requiring debridement. We have been putting Prisma on all wounds. I do not believe that the ketoconazole has done too much good for his feet. He will use Lotrisone I am going to give him a 2-week course of terbinafine. We still do not have  a dermatology appointment 3/2 left heel remains closed however there is skin over bone in this area I pointed this out to him today. The left buttock wound is epithelialized but still does not look completely stable. The area on the left leg required debridement were using silver collagen  here. With regards to his feet we changed to Lotrisone last week and silver alginate. 3/9; left heel remains closed. Left buttock remains closed. The area on the right foot is essentially closed. The left foot remains unchanged. Slightly smaller on the left lateral calf. Using silver collagen to both of these areas 3/16-Left heel remains closed. Area on right foot is closed. Left lateral calf above the lateral malleolus open wound requiring debridement with easy bleeding. Left dorsal wound proximal to first toe also debrided. Left ischial area open new. Patient has been using Prisma with wrapping every 3 days. Dermatology appointment is apparently tomorrow.Patient has completed his terbinafine 2-week course with some apparent improvement according to him, there is still flaking and dry skin in his foot on the left 3/23; area on the right foot is reopened. The area on the left anterior foot is about the same still a very necrotic adherent surface. He still has the area on the left leg and reopening is on the left buttock. He apparently saw dermatology although I do not have a note. According to the patient who is usually fairly well informed they did not have any good ideas. Put him on oral terbinafine which she is been on before. 3/30; using silver collagen to all wounds. Apparently his dermatologist put him on doxycycline and rifampin presumably some culture grew staph. I do not have this result. He remains on terbinafine although I have used terbinafine on him before 4/6; patient has had a fairly substantial reopening on the right foot between the first and second toes. He is finished his terbinafine and I believe  is on doxycycline and rifampin still as prescribed by dermatology. We have been using silver collagen to all his wounds although the patient reports that he thinks silver alginate does better on the wounds on his buttock. 4/13; the area on his left lateral calf about the same size but it did not require debridement. ooLeft dorsal foot just proximal to the webspace between the first and second toes is about the same. Still nonviable surface. I note some superficial bronze discoloration of the dorsal part of his foot ooRight dorsal foot just proximal to the first and second toes also looks about the same. I still think there may be the same discoloration I noted above on the left ooLeft buttock wound looks about the same 4/20; left lateral calf appears to be gradually contracting using silver collagen. 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 The left dorsal foot wound is debrided of tightly adherent necrotic debris and really cleans up quite nicely. The right area is worse with expansion. I did not debride this it is now over the base of the second toe ooThe area on his left buttock is smaller no debridement is required using silver collagen 5/4; left calf continues to make good progress. ooHe arrives with erythema around the wounds on his dorsal foot which even extends to the plantar aspect. Very concerning for coexistent infection. He is finished the erythromycin I gave him for possible erythrasma this does not seem to have helped. ooThe area on the left foot is about the same base of the dorsal toes ooIs area on the buttock looks improved on the left 5/11; left calf and left buttock continued to make good progress. Left foot is about the same to slightly improved. ooMajor problem is on the right foot. He has not had an x-ray. Deep tissue culture I did last week showed  both Enterobacter and E. coli. I did not change  the doxycycline I put him on empirically although neither 1 of these were plated to doxycycline. He arrives today with the erythema looking worse on both the dorsal and plantar foot. Macerated skin on the bottom of the foot. he has not been systemically unwell 5/18-Patient returns at 1 week, left calf wound appears to be making some progress, left buttock wound appears slightly worse than last time, left foot wound looks slightly better, right foot redness is marginally better. X-ray of both feet show no air or evidence of osteomyelitis. Patient is finished his Omnicef and terbinafine. He continues to have macerated skin on the bottom of the left foot as well as right 5/26; left calf wound is better, left buttock wound appears to have multiple small superficial open areas with surrounding macerated skin. X-rays that I did last time showed no evidence of osteomyelitis in either foot. He is finished cefdinir and doxycycline. I do not think that he was on terbinafine. He continues to have a large superficial open area on the right foot anterior dorsal and slightly between the first and second toes. I did send him to dermatology 2 months ago or so wondering about whether they would do a fungal scraping. I do not believe they did but did do a culture. We have been using silver alginate to the toe areas, he has been using antifungals at home topically either ketoconazole or Lotrisone. We are using silver collagen on the left foot, silver alginate on the right, silver collagen on the left lateral leg and silver alginate on the left buttock 6/1; left buttock area is healed. We have the left dorsal foot, left lateral leg and right dorsal foot. We are using silver alginate to the areas on both feet and silver collagen to the area on his left lateral calf 6/8; the left buttock apparently reopened late last week. He is not really sure how this happened. He is tolerating the terbinafine. Using silver alginate to  all wounds 6/15; left buttock wound is larger than last week but still superficial. ooCame in the clinic today with a report of purulence from the left lateral leg I did not identify any infection ooBoth areas on his dorsal feet appear to be better. He is tolerating the terbinafine. Using silver alginate to all wounds 6/22; left buttock is about the same this week, left calf quite a bit better. His left foot is about the same however he comes in with erythema and warmth in the right forefoot once again. Culture that I gave him in the beginning of May showed Enterobacter and E. coli. I gave him doxycycline and things seem to improve although neither 1 of these organisms was specifically plated. 6/29; left buttock is larger and dry this week. Left lateral calf looks to me to be improved. Left dorsal foot also somewhat improved right foot completely unchanged. The erythema on the right foot is still present. He is completing the Ceftin dinner that I gave him empirically [see discussion above.) 7/6 - All wounds look to be stable and perhaps improved, the left buttock wound is slightly smaller, per patient bleeds easily, completed ceftin, the right foot redness is less, he is on terbinafine 7/13; left buttock wound about the same perhaps slightly narrower. Area on the left lateral leg continues to narrow. Left dorsal foot slightly smaller right foot about the same. We are using silver alginate on the right foot and Hydrofera Blue to the  areas on the left. Unna boot on the left 2 layer compression on the right 7/20; left buttock wound absolutely the same. Area on lateral leg continues to get better. Left dorsal foot require debridement as did the right no major change in the 7/27; left buttock wound the same size necrotic debris over the surface. The area on the lateral leg is closed once again. His left foot looks better right foot about the same although there is some involvement now of the posterior  first second toe area. He is still on terbinafine which I have given him for a month, not certain a centimeter major change 06/25/19-All wounds appear to be slightly improved according to report, left buttock wound looks clean, both foot wounds have minimal to no debris the right dorsal foot has minimal slough. We are using Hydrofera Blue to the left and silver alginate to the right foot and ischial wound. 8/10-Wounds all appear to be around the same, the right forefoot distal part has some redness which was not there before, however the wound looks clean and small. Ischial wound looks about the same with no changes 8/17; his wound on the left lateral calf which was his original chronic venous insufficiency wound remains closed. Since I last saw him the areas on the left dorsal foot right dorsal foot generally appear better but require debridement. The area on his left initial tuberosity appears somewhat larger to me perhaps hyper granulated and bleeds very easily. We have been using Hydrofera Blue to the left dorsal foot and silver alginate to everything else 8/24; left lateral calf remains closed. The areas on his dorsal feet on the webspace of the first and second toes bilaterally both look better. The area on the left buttock which is the pressure ulcer stage II slightly smaller. I change the dressing to Hydrofera Blue to all areas 8/31; left lateral calf remains closed. The area on his dorsal feet bilaterally look better. Using Hydrofera Blue. Still requiring debridement on the left foot. No change in the left buttock pressure ulcers however 9/14; left lateral calf remains closed. Dorsal feet look quite a bit better than 2 weeks ago. Flaking dry skin also a lot better with the ammonium lactate I gave him 2 weeks ago. The area on the left buttock is improved. He states that his Roho cushion developed a leak and he is getting a new one, in the interim he is offloading this vigorously 9/21; left calf  remains closed. Left heel which was a possible DTI looks better this week. He had macerated tissue around the left dorsal foot right foot looks satisfactory and improved left buttock wound. I changed his dressings to his feet to silver alginate bilaterally. Continuing Hydrofera Blue on the left buttock. 9/28 left calf remains closed. Left heel did not develop anything [possible DTI] dry flaking skin on the left dorsal foot. Right foot looks satisfactory. Improved left buttock wound. We are using silver alginate on his feet Hydrofera Blue on the buttock. I have asked him to go back to the Lotrisone on his feet including the wounds and surrounding areas 10/5; left calf remains closed. The areas on the left and right feet about the same. A lot of this is epithelialized however debris over the remaining open areas. He is using Lotrisone and silver alginate. The area on the left buttock using Hydrofera Blue 10/26. Patient has been out for 3 weeks secondary to Covid concerns. He tested negative but I think his wife tested positive.  He comes in today with the left foot substantially worse, right foot about the same. Even more concerning he states that the area on his left buttock closed over but then reopened and is considerably deeper in one aspect than it was before [stage III wound] 11/2; left foot really about the same as last week. Quarter sized wound on the dorsal foot just proximal to the first second toes. Surrounding erythema with areas of denuded epithelium. This is not really much different looking. Did not look like cellulitis this time however. ooRight foot area about the same.. We have been using silver alginate alginate on his toes ooLeft buttock still substantial irritated skin around the wound which I think looks somewhat better. We have been using Hydrofera Blue here. 11/9; left foot larger than last week and a very necrotic surface. Right foot I think is about the same perhaps slightly  smaller. Debris around the circumference also addressed. Unfortunately on the left buttock there is been a decline. Satellite lesions below the major wound distally and now a an additional one posteriorly we have been using Hydrofera Blue but I think this is a pressure issue 11/16; left foot ulcer dorsally again a very adherent necrotic surface. Right foot is about the same. Not much change in the pressure ulcer on his left buttock. 11/30; left foot ulcer dorsally basically the same as when I saw him 2 weeks ago. Very adherent fibrinous debris on the wound surface. Patient reports a lot of drainage as well. 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 We have been using Iodoflex, patient changed back to alginate because of the drainage. Area on his right dorsal foot really looks benign with a healthier surface certainly a lot better than on the left. Left buttock wounds all improved using Hydrofera Blue 12/7; left dorsal foot again no improvement. Tightly adherent debris. PCR culture I did last week only showed likely skin contaminant. I have gone ahead and done a punch biopsy of this which is about the last thing in terms of investigations I can think to do. He has known venous insufficiency and venous hypertension and this could be the issue here. The area on the right foot is about the same left buttock slightly worse according to our intake nurse secondary to [facility] Blue sticking to the wound 12/14; biopsy of the left foot that I did last time showed changes that could be related to wound healing/chronic stasis dermatitis phenomenon no neoplasm. We have been using silver alginate to both feet. I change the one on the left today to Sorbact and silver alginate to his other 2 wounds 12/28; the patient arrives with the following problems; ooMajor issue is the dorsal left foot which continues to be a larger deeper wound area. Still with a completely  nonviable surface ooParadoxically the area mirror image on the right on the right dorsal foot appears to be getting better. ooHe had some loss of dry denuded skin from the lower part of his original wound on the left lateral calf. Some of this area looked a little vulnerable and for this reason we put him in wrap that on this side this week ooThe area on his left buttock is larger. He still has the erythematous circular area which I think is a combination of pressure, sweat. This does not look like cellulitis or fungal dermatitis 11/26/2019; -Dorsal left foot large open wound with depth. Still debris over the surface. Using Sorbact ooThe area  on the dorsal right foot paradoxically has closed over [facility] has a reopening on the left ankle laterally at the base of his original wound that extended up into the calf. This appears clean. ooThe left buttock wound is smaller but with very adherent necrotic debris over the surface. We have been using silver alginate here as well The patient had arterial studies done in 2017. He had biphasic waveforms at the dorsalis pedis and posterior tibial bilaterally. ABI in the left was 1.17. Digit waveforms were dampened. He has slight spasticity in the great toes I do not think a TBI would be possible 1/11; the patient comes in today with a sizable reopening between the first and second toes on the right. This is not exactly in the same location where we have been treating wounds previously. According to our intake nurse this was actually fairly deep but 0.6 cm. The area on the left dorsal foot looks about the same the surface is somewhat cleaner using Sorbact, his MRI is in 2 days. We have not managed yet to get arterial studies. The new reopening on the left lateral calf looks somewhat better using alginate. The left buttock wound is about the same using alginate 1/18; the patient had his ARTERIAL studies which were quite normal. ABI in the right at 1.13 with  triphasic/biphasic waveforms on the left ABI 1.06 again with triphasic/biphasic waveforms. It would not have been possible to have done a toe brachial index because of spasticity. We have been using Sorbac to the left foot alginate to the rest of his wounds on the right foot left lateral calf and left buttock 1/25; arrives in clinic with erythema and swelling of the left forefoot worse over the first MTP area. This extends laterally dorsally and but also posteriorly. Still has an area on the left lateral part of the lower part of his calf wound it is eschared and clearly not closed. ooArea on the left buttock still with surrounding irritation and erythema. ooRight foot surface wound dorsally. The area between the right and first and second toes appears better. 2/1; ooThe left foot wound is about the same. Erythema slightly better I gave him a week of doxycycline empirically ooRight foot wound is more extensive extending between the toes to the plantar surface ooLeft lateral calf really no open surface on the inferior part of his original wound however the entire area still looks vulnerable ooAbsolutely no improvement in the left buttock wound required debridement. 2/8; the left foot is about the same. Erythema is slightly improved I gave him clindamycin last week. ooRight foot looks better he is using Lotrimin and silver alginate ooHe has a breakdown in the left lateral calf. Denuded epithelium which I have removed ooLeft buttock about the same were using Hydrofera Blue 2/15; left foot is about the same there is less surrounding erythema. Surface still has tightly adherent debris which I have debriding however not making any progress ooRight foot has a substantial wound on the medial right second toe between the first and second webspace. ooStill an open area on the left lateral calf distal area. ooButtock wound is about the same 2/22; left foot is about the same less surrounding  erythema. Surface has adherent debris. Polymen Ag Right foot area significant wound between the first and second toes. We have been using silver alginate here Left lateral leg polymen Ag at the base of his original venous insufficiency wound ooLeft buttock some improvement here 3/1; ooRight foot is deteriorating in the  first second toe webspace. Larger and more substantial. We have been using silver alginate. ooLeft dorsal foot about the same markedly adherent surface debris using PolyMem Ag ooLeft lateral calf surface debris using PolyMem AG ooLeft buttock is improved again using PolyMem Ag. ooHe is completing his terbinafine. The erythema in the foot seems better. He has been on this for 2 weeks 3/8; no improvement in any wound area in fact he has a small open area on the dorsal midfoot which is new this week. He has not gotten his foot x-rays yet 3/15; his x-rays were both negative for osteomyelitis of both feet. No major change in any of his wounds on the extremities however his buttock wounds are better. We have been using polymen on the buttocks, left lower leg. Iodoflex on the left foot and silver alginate on the right 3/22; arrives in clinic today with the 2 major issues are the improvement in the left dorsal foot wound which for once actually looks healthy with a nice healthy wound surface without debridement. Using Iodoflex here. Unfortunately on the left lateral calf which is in the distal part of his original wound he came to the clinic here for there was purulent drainage noted some increased breakdown scattered around the original area and a small area proximally. We we are using polymen here will change to silver alginate today. His buttock wound on the left is better and I think the area on the right first second toe webspace is also improved 3/29; left dorsal foot looks better. Using Iodoflex. Left ankle culture from deterioration last time grew E. coli, Enterobacter and  Enterococcus. I will give him a course of cefdinir although that will not cover Enterococcus. The area on the right foot in the webspace of the first and second toe lateral first toe looks better. The area on his buttock is about healed Vascular appointment is on April 21. This is to look at his venous system vis--vis continued breakdown of the wounds on the left including the left lateral leg and left dorsal foot he. He has had previous ablations on this side 4/5; the area between the right first and second toes lateral aspect of the first toe looks better. Dorsal aspect of the left first toe on the left foot also improved. Unfortunately the left lateral lower leg is larger and there is a second satellite wound superiorly. The usual superficial abrasions on the left buttock overall better but certainly not closed 4/12; the area between the right first and second toes is improved. Dorsal aspect of the left foot also slightly smaller with a vibrant healthy looking surface. No real change in the left lateral leg and the left buttock wound is healed He has an unaffordable co-pay for Apligraf. Appointment with vein and vascular with regards to the left leg venous part of the circulation is on 4/21 4/19; we continue to see improvement in all wound areas. Although this is minor. He has his vascular appointment on 4/21. The area on the left buttock has not reopened although right in the center of this area the skin looks somewhat threatened 4/26; the left buttock is unfortunately reopened. In general his left dorsal foot has a healthy surface and looks somewhat smaller although it was not measured as such. The area between his first and second toe webspace on the right as a small wound against the first toe. The patient saw vascular surgery. The real question I was asking was about the small saphenous vein on  the left. He has previously ablated left greater saphenous vein. Nothing further was commented on  on the left. Right greater saphenous vein without reflux at the saphenofemoral junction or proximal thigh there was no indication for ablation of the right greater saphenous vein duplex was negative for DVT bilaterally. They did not think there was anything from a vascular surgery point of view that could be offered. They ABIs within normal limits 5/3; only small open area on the left buttock. The area on the left lateral leg which was his original venous reflux is now 2 wounds both which look clean. We are using Iodoflex on the left dorsal foot which looks healthy and smaller. He is down to a very tiny area between the right first and second toes, using silver alginate 5/10; all of his wounds appear better. We have much better edema control in 4 layer compression on the left. This may be the factor that is allowing the left foot and left lateral calf to heal. He has external compression garments at home 04/14/20-All of his wounds are progressing well, the left forefoot is practically closed, left ischium appears to be about the same, right toe webspace is also smaller. The left lateral leg is about the same, continue using Hydrofera Blue to this, silver alginate to the ischium, Iodoflex to the toe space on the right 6/7; most of his wounds outside of the left buttock are doing well. The area on the left lateral calf and left dorsal foot are smaller. The area on the right foot in between the first and second toe webspace is barely visible although he still says there is some drainage here is the only reason I did not heal this out. ooUnfortunately the area on the left buttock almost looks like he has a skin tear from tape. He has open wound and then a large flap of skin that we are trying to get adherence over an area just next to the remaining wound 6/21; 2 week follow-up. I believe is been here for nurse visits. Miraculously the area between his first and second toes on the left dorsal foot is closed  over. Still open on the right first second web space. The left lateral calf has 2 open areas. Distally this is more superficial. The proximal area had a little more depth and required debridement of adherent necrotic material. His buttock wound is actually larger we have been using silver alginate here 6/28; the patient's area on the left foot remains closed. Still open wet area between the first and second toes on the right and also extending into the plantar aspect. We have been using silver alginate in this location. He has 2 areas on the left lower leg part of his original long wounds which I think are better. We have been using Hydrofera Blue here. Hydrofera Blue to the left buttock which is stable 7/12; left foot remains closed. Left ankle is closed. May be a small area between his right first and second toes the only truly open area is on the left buttock. We have been using Hydrofera Blue here 7/19; patient arrives with marked deterioration especially in the left foot and ankle. We did not put him in a compression wrap on the left last week in fact he wore his juxta lite stockings on either side although he does not have an underlying stocking. He has a reopening on the left dorsal foot, left lateral ankle and a new area on the right dorsal ankle. More  worrisome is the degree of erythema on the left foot extending on the lateral foot into the lateral lower leg on the left 7/26; the patient had erythema and drainage from the lateral left ankle last week. Culture of this grew MRSA resistant to doxycycline and clindamycin which are the 2 antibiotics we usually use with this patient who has multiple antibiotic allergies including linezolid, trimethoprim sulfamethoxazole. I had give him an empiric doxycycline and he comes in the area certainly looks somewhat better although it is blotchy in his lower leg. He has not been systemically unwell. He has had areas on the left dorsal foot which is a  reopening, chronic wounds on the left lateral ankle. Both of these I think are secondary to chronic venous insufficiency. The area between his first and second toes is closed as far as I can tell. He had a new wrap injury on the right dorsal ankle last week. Finally he has an area on the left buttock. We have been using silver alginate to everything except the left buttock we are using Hydrofera Blue 06/30/20-Patient returns at 1 week, has been given a sample dose pack of NUZYRA which is a tetracycline derivative [omadacycline], patient has completed those, we have been using silver alginate to almost all the wounds except the left ischium where we are using Hydrofera Blue all of them look better 8/16; since I last saw the patient he has been doing well. The area on the left buttock, left lateral ankle and left foot are all closed today. He has completed the [place] I gave him last time and tolerated this well. He still has open areas on the right dorsal ankle and in the right first second toe area which we are using silver alginate. 8/23; we put him in his bilateral external compression stockings last week as he did not have anything open on either leg except for concerning area between the right first and second toe. He comes in today with an area on the left dorsal foot slightly more proximal than the original wound, the left lateral foot but this is actually a continuation of the area he had on the left lateral ankle from last time. As well he is opened up on the left buttock again. 8/30; comes in today with things looking a lot better. The area on the left lower ankle has closed down as has the left foot but with eschar in both areas. The area on the dorsal right ankle is also epithelialized. Very little remaining of the left buttock wound. We have been using silver alginate on all wound areas 9/13; the area in the first second toe webspace on the right has fully epithelialized. He still has some  vulnerable epithelium on the right and the ankle and the dorsal foot. He notes weeping. He is using his juxta lite stocking. On the left again the left dorsal foot is closed left lateral ankle is closed. We went to the juxta lite stocking here as well. ooStill vulnerable in the left buttock although only 2 small open areas remain here 9/27; 2-week follow-up. We did not look at his left leg but the patient says everything is closed. He is a bit disturbed by the amount of edema in his left foot he is using juxta lite stockings but asking about over the toes stockings which would be 30/40, will talk to him next time. According to him there is no open wound on either the left foot or the left ankle/calf He  has an open area on the dorsal right calf which I initially point a wrap injury. He has superficial remaining wound on the left ischial tuberosity been using silver alginate although he says this sticks to the wound 10/5; we gave him 2-week follow-up but he called yesterday expressing some concerns about his right foot right ankle and the left buttock. He came in early. There is still no open areas on the left leg and that still in his juxta lite stocking 10/11; he only has 1 small area on the left buttock that remains measuring millimeters 1 mm. Still has the same irritated skin in this area. We recommended zinc oxide when this eventually closes and pressure relief is meticulously is he can do this. He still has an area on the dorsal part of his right first through third toes which is a bit irritated and still open and on the dorsal ankle near the crease of the ankle. We have been using silver alginate and using his own stocking. He has nothing open on the left leg or foot 10/25; 2-week follow-up. Not nearly as good on the left buttock as I was hoping. For open areas with 5 looking threatened small. He has the erythematous irritated chronic skin in this area. oo1 area on the right dorsal ankle. He  reports this area bleeds easily ooRight dorsal foot just proximal to the base of his toes ooWe have been using silver alginate. 11/8; 2-week follow-up. Left buttock is about the same although I do not think the wounds are in the same location we have been using silver alginate. I have asked him to use zinc oxide on the skin around the wounds. ooHe still has a small area on the right dorsal ankle he reports this bleeds easily ooRight dorsal foot just proximal to the base of the toes does not have anything open although the skin is very dry and scaly ooHe has a new opening on the nailbed of the left great toe. Nothing on the left ankle 11/29; 3-week follow-up. Left buttock has 2 open areas. And washing of these wounds today started bleeding easily. Suggesting very friable tissue. We have been using silver alginate. Right dorsal ankle which I thought was initially a wrap injury we have been using silver alginate. Nothing open between the toes that I can see. He states the area on the left dorsal toe nailbed healed after the last visit in 2 or 3 days 12/13; 3-week follow-up. His left buttock now has 3 open areas but the original 2 areas are smaller using polymen here. Surrounding skin looks better. The right dorsal ankle is closed. He has a small opening on the right dorsal foot at the level of the third toe. In general the skin looks better here. He is wearing his juxta lite stocking on the left leg says there is nothing open 11/24/2020; 3 weeks follow-up. His left buttock still has the 3 open areas. We have been using polymen but due to lack of response he changed to [facility] area. Surrounding skin is dry erythematous and irritated looking. There is no evidence of infection either bacterial or fungal however there is loss of surface epithelium ooHe still has very dry skin in his foot causing irritation and erythema on the dorsal part of his toes. This is not responded to prolonged courses of  antifungal simply looks dry and irritated 1/24; left buttock area still looks about the same he was unable to find the triad ointment that we  had suggested. The area on the right lower leg just above the dorsal ankle has reopened and the areas on the right foot between the first second and second third toes and scaling on the bottom of the foot has been about the same for quite some time now. been using silver alginate to all wound areas 2/7; left buttock wound looked quite good although not much smaller in terms of surface area surrounding skin looks better. Only a few dry flaking areas on the right foot in between the first and second toes the skin generally looks better here [ammonium lactate]. Finally the area on the right dorsal ankle is closed 2/21; ooThere is no open area on the right foot even between the right first and second toe. Skin around this area dorsally and plantar aspects look better. ooHe has a reopening of the area on the right ankle just above the crease of the ankle dorsally. I continue to think that this is probably friction from spasms may be even this time with his stocking under the compression stockings. ooWounds on his left buttock look about the same there a couple of areas that have reopened. He has a total square area of loss of epithelialization. This does not look like infection it looks like a contact dermatitis but I just cannot determine to what 3/14; there is nothing on the right foot between the first and second toes this was carefully inspected under illumination. Some chronic irritation on the dorsal part of his foot from toes 1-3 at the base. Nothing really open here substantially. Still has an area on the right foot/ankle that is actually larger and hyper granulated. His buttock area on the left is just about closed however he has chronic inflammation with loss of the surface epithelial layer 3/28; 2-week follow-up. In clinic today with a new wound on the  left anterior mid tibia. Says this happened about 2 weeks ago. He is not really sure how wonders about the spasticity of his legs at night whether that could have caused this other than that he does not have a good idea. He has been using topical antibiotics and silver alginate. The area on his right dorsal ankle seems somewhat better. ooFinally everything on his left buttock is closed. 4/11; 2-week follow-up. All of his wounds are better except for the area over the ischium and left buttock which have opened up widely again. At least part of this is covered in necrotic fibrinous material another part had rolled nonviable skin. The area on the right ankle, left anterior mid tibia are both a lot better. He had no open wounds on either foot including the areas between the first and second toes 4/25; patient presents for 2-week follow-up. He states that the wounds are overall stable. He has no complaints today and states he is using Hydrofera Blue to open wounds. 5/9; have not seen this man in over a month. For my memory he has open areas on the left mid tibia and right ankle. T oday he has new open area on the right dorsal foot which we have not had a problem with recently. He has the sustained area on the left buttock He is also changed his insurance at the beginning of the year [employer]. We will need prior authorizations for debridement 5/23; patient presents for 2-week follow-up. He has prior authorizations for debridement. He denies any issues in the past 2 weeks with his wound care. He has been using Hydrofera Blue to all  the wounds. He does report a circular rash to the upper left leg that is new. He denies acute signs of infection. 6/6; 2-week follow-up. The patient has open wounds on the left buttock which are worse than the last time I saw this about a month ago. He also has a new area to me on the left anterior mid tibia with some surrounding erythema. The area on the dorsal ankle on  the right is closed but I think this will be a friction injury every time this area is exposed to either our wraps or his compression stockings caused by unrelenting spasms in this leg. 6/20; 2-week follow-up. oo The patient has open wounds on the left buttock which is about the same. Using [facility] here. - The left mid tibia has a static amount of surrounding erythema. Also a raised area in the center. We have been using Hydrofera Blue here. ooooFinally he has broken down in his dorsal right foot extending between the first and second toes and going to the base of the first and second toe webspace. I have previously assumed that this was severe venous hypertension 7/5; 2-week follow-up oo The left buttock wound actually looks better. We are using Hydrofera Blue. He has extensive skin irritation around this area and I have not really been able to get that any better. I have tried Lotrisone i.e. antifungals and steroids. More most recently we have just been using Coloplast really looks about the same. oo The left mid tibia which was new last week culture to have very resistant staph aureus. Not only methicillin-resistant but doxycycline resistant. The patient has a plethora of antibiotic allergies including sulfa, linezolid. I used topical bacitracin on this but he has not started this yet. oo In addition he has an expanding area of erythema with a wound on the dorsal right foot. I did a deep tissue culture of this area today 7/12; oo Left buttock area actually looks better surrounding skin also looks less irritated. oo Left mid tibia looks about the same. He is using bacitracin this is not worse oo Right dorsal foot looks about the same as well. oo The left first toe also looks about the same 7/19; left buttock wound continues to improve in terms of open areas oo Left mid tibia is still concerning amount of swelling he is using bacitracin oo Dorsal left first toe somewhat  smaller oo Right dorsal foot somewhat smaller 7/25; left buttock wound actually continues to improve oo Left mid tibia area has less swelling. I gave him all my samples of new Nuzyra. This seems to have helped although the wound is still open it. His abrasion closed by here oo Left dorsal great toe really no better. Still a very nonviable surface oo Right dorsal foot perhaps some better. oo We have been using bacitracin and silver nitrate to the areas on his lower legs and Hydrofera Blue to the area on the buttock. 8/16 oo Disappointed that his left buttock wound is actually more substantial. Apparently during the last nurse visit these were both very small. He has continued irritation to a large area of skin on his buttock. I have never been able to totally explain this although I think it some combination of the way he sits, pressure, moisture. He is not incontinent enough to contribute to this. oo Left dorsal great toe still fibrinous debris on the surface that I have debrided today oo Large area across the dorsal right toes. oo The area on  the left anterior mid tibia has less swelling. He completed the [place]. This does not look infected although the tissue is still fried 8/30; 2-week follow-up. oo Left buttock areas not improved. We used Hydrofera Blue on this. Weeping wet with the surrounding erythema that I have not been able to control even with Lotrisone and topical Coloplast oo Left dorsal great toe looks about the same oo More substantial area again at the base of his toes on the left which is new this week. oo Area across the dorsal right toes looks improved oo The left anterior mid tibia looks like it is trying to close 9/13; 2-week follow-up. Using silver alginate on all of his wounds. The left dorsal foot does not look any better. He has the area on the dorsal toe and also the areas at the base of all of his toes 1 through 3. On the right foot he has a similar  pattern in a similar area. He has the area on his left mid tibia that looks fairly healthy. Finally the area on his left buttock looks somewhat bette 9/20; culture I did of the left foot which was a deep tissue culture last time showed E. coli he has erythema around this wound. Still a completely necrotic surface. His right dorsal foot looks about the same. He has a very friable surface to the left anterior mid tibia. Both buttock wounds look better. We have been using silver alginate to all wounds 10/4; he has completed the cephalexin that I gave him last time for the left foot. He is using topical gentamicin under silver alginate silver alginate being applied to all the wounds. Unfortunately all the wounds look irritated on his dorsal right foot dorsal left foot left mid tibia. I wonder if this could be a silver allergy. I am going to change him to [facility] on the lower extremity. The skin on the left buttock and left posterior thigh still flaking dry and irritated. This has continued no matter what I have applied topically to this. He has a solitary open wound which by itself does not look too bad however the entire area of surrounding skin does not change no matter what we have applied here 10/18; the area on the left dorsal foot and right dorsal foot both look better. The area on the right extends into the plantar but not between his toes. We have been using silver alginate. He still has a rectangular erythematous area around the area on the left tibia. The wound itself is very small. Finally everything on his left buttock looks a little larger the skin is erythematous 11/15; patient comes in with the left dorsal and right dorsal foot distally looking somewhat better. Still nonviable surface on the left foot which required debridement. He still has the area on the left anterior mid tibia although this looks somewhat better. He has a new area on the right lateral lower leg just above the  ankle. Finally his left buttock looks terrible with multiple superficial open wounds geometric square shaped area of chronic erythema which I have not been able to sort through 11/29; right dorsal foot and left dorsal foot both look somewhat better. No debridement required. He has the fragile area on the left anterior mid tibia this looks and continues to look somewhat better. Right lateral lower leg just above the ankle we identified last time also looks better. In general the area on his left buttock looks improved. We are using Hydrofera Blue to  all wound areas 12/13; right dorsal foot looks better. The area on the right lateral leg is healed. Left dorsal foot has 2 open areas both of which require debridement. The fragile area on the left anterior mid tibial looks better. Smaller area on his buttocks. Were using Hydrofera Blue 1/10; patient comes in with everything looking slightly larger and/or worse. This includes his left buttock, reopening of the left mid tibia, larger areas on the left dorsal foot and what looks to be a cellulitis on the right dorsal and plantar foot. We have been using Hydrofera Blue on all wounds. 1/17; right dorsal foot distally looks better today. The left foot has 2 open wounds that are about the same surrounding erythema. 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 No malignancy they did not stain for fungus Objective Constitutional Patient is hypertensive.. Pulse regular and within target range for patient.Marland Kitchen Respirations regular, non-labored and within target range.. Temperature is normal and within the target range for the patient.Marland Kitchen Appears in no distress. Vitals Time Taken: 9:03 AM, Height: 70 in, Weight: 216 lbs, BMI: 31, Temperature: 98.2 F, Pulse: 127 bpm, Respiratory Rate: 18 breaths/min, Blood Pressure: 153/77 mmHg. General  Notes: Wound exam; oo Right foot looks better there is nothing open on the plantar foot dorsally things look like they are contracting. No erythema oo Left dorsal foot 2 open wounds surface not the best but not terrible. The surrounding erythema here worries me a bit. He is insensate no systemic symptoms. oo Left anterior tibial wound this looks better. oo Left buttock absolutely no change in large open areas. The area is erythematous de-epithelialized. No clear satellite lesions Integumentary (Hair, Skin) Wound #41R status is Open. Original cause of wound was Gradually Appeared. The date acquired was: 03/16/2020. The wound has been in treatment 90 weeks. The wound is located on the Left Ischium. The wound measures 11cm length x 4.4cm width x 0.3cm depth; 38.013cm^2 area and 11.404cm^3 volume. There is Fat Layer (Subcutaneous Tissue) exposed. There is no tunneling or undermining noted. There is a medium amount of sanguinous drainage noted. The wound margin is distinct with the outline attached to the wound base. There is large (67-100%) red, friable granulation within the wound bed. There is no necrotic tissue within the wound bed. Wound #51R status is Open. Original cause of wound was Trauma. The date acquired was: 01/26/2021. The wound has been in treatment 42 weeks. The wound is located on the Left,Anterior Lower Leg. The wound measures 1.1cm length x 0.5cm width x 0.1cm depth; 0.432cm^2 area and 0.043cm^3 volume. There is Fat Layer (Subcutaneous Tissue) exposed. There is no tunneling or undermining noted. There is a medium amount of serosanguineous drainage noted. The wound margin is flat and intact. There is large (67-100%) red granulation within the wound bed. There is no necrotic tissue within the wound bed. Wound #52 status is Open. Original cause of wound was Gradually Appeared. The date acquired was: 03/27/2021. The wound has been in treatment 36 weeks. The wound is located on the  Right,Dorsal Foot. The wound measures 1cm length x 5cm width x 0.1cm depth; 3.927cm^2 area and 0.393cm^3 volume. There is Fat Layer (Subcutaneous Tissue) exposed. There is no tunneling or undermining noted. There is a medium amount of serosanguineous drainage noted. The wound margin is distinct with the outline attached to the wound base. There is small (1-33%) pink, pale granulation  within the wound bed. There is a large (67- 100%) amount of necrotic tissue within the wound bed including Adherent Slough. Wound #56 status is Open. Original cause of wound was Gradually Appeared. The date acquired was: 07/11/2021. The wound has been in treatment 20 weeks. The wound is located on the Left,Dorsal Foot. The wound measures 4.1cm length x 1.4cm width x 0.1cm depth; 4.508cm^2 area and 0.451cm^3 volume. There is Fat Layer (Subcutaneous Tissue) exposed. There is no tunneling or undermining noted. There is a medium amount of serosanguineous drainage noted. The wound margin is distinct with the outline attached to the wound base. There is medium (34-66%) pink, pale granulation within the wound bed. There is a medium (34-66%) amount of necrotic tissue within the wound bed including Adherent Slough. Wound #57 status is Open. Original cause of wound was Gradually Appeared. The date acquired was: 08/25/2021. The wound has been in treatment 15 weeks. The wound is located on the [place]. The wound measures 0.1cm length x 0.3cm width x 0.1cm depth; 0.024cm^2 area and 0.002cm^3 volume. There is Fat Layer (Subcutaneous Tissue) exposed. There is no tunneling or undermining noted. There is a medium amount of serosanguineous drainage noted. The wound margin is distinct with the outline attached to the wound base. There is medium (34-66%) pink granulation within the wound bed. There is a medium (34-66%) amount of necrotic tissue within the wound bed including Adherent Slough. Wound #60 status is Open. Original cause  of wound was Gradually Appeared. The date acquired was: 12/08/2021. The wound is located on the Right,Lateral Lower Leg. The wound measures 0.2cm length x 0.2cm width x 0.1cm depth; 0.031cm^2 area and 0.003cm^3 volume. There is Fat Layer (Subcutaneous Tissue) exposed. There is no tunneling or undermining noted. There is a medium amount of serosanguineous drainage noted. The wound margin is distinct with the outline attached to the wound base. There is large (67-100%) red granulation within the wound bed. There is no necrotic tissue within the wound bed. 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 - Dr. Dellia Nims Bathing/ Shower/ Hygiene: May shower and wash wound with soap and water. - on days that dressing is changed Edema Control - Lymphedema / SCD / Other: Elevate legs to the level of the heart or above for 30 minutes daily and/or when sitting, a frequency of: - throughout the day Compression stocking or Garment 30-40 mm/Hg pressure to: - Juxtalite to both legs daily Off-Loading: Roho cushion for wheelchair Turn and reposition every 2 hours WOUND #41R: - Ischium Wound Laterality: Left Cleanser: Soap and Water Every Other Day/30 Days Discharge Instructions: May shower and wash wound with dial antibacterial soap and water prior to dressing change. Peri-Wound Care: Ketoconazole Cream 2% Every Other Day/30 Days Discharge  Instructions: Apply Ketoconazole as directed Peri-Wound Care: Triad Hydrophilic Wound Dressing Tube, 6 (oz) Every Other Day/30 Days Discharge Instructions: Apply to periwound with each dressing change Prim Dressing: Hydrofera Blue Ready Foam, 2.5 x2.5 in (DME) (Generic) Every Other Day/30 Days  ary Discharge Instructions: Apply to wound bed as instructed Secondary Dressing: ABD Pad, 5x9 Every Other Day/30 Days Discharge Instructions: Apply over primary dressing as directed. Secured With: 16M Medipore H Soft Cloth Surgical T 4 x 2 (in/yd) (DME) (Generic) Every Other Day/30 Days ape Discharge Instructions: Secure dressing with tape as directed. WOUND #51R: - Lower Leg Wound Laterality: Left, Anterior Cleanser: Soap and Water Every Other Day/30 Days Discharge Instructions: May shower and wash wound with dial antibacterial soap and water prior to dressing change. Prim Dressing: Hydrofera Blue Ready Foam, 2.5 x2.5 in (DME) (Generic) Every Other Day/30 Days ary Discharge Instructions: Apply to wound bed as instructed Secondary Dressing: Zetuvit Plus Silicone Border Dressing 4x4 (in/in) Every Other Day/30 Days Discharge Instructions: Apply silicone border over primary dressing as directed. Secured With: 16M Medipore H Soft Cloth Surgical T 4 x 2 (in/yd) (DME) (Generic) Every Other Day/30 Days ape Discharge Instructions: Secure dressing with tape as directed. WOUND #52: - Foot Wound Laterality: Dorsal, Right Cleanser: Soap and Water Every Other Day/30 Days Discharge Instructions: May shower and wash wound with dial antibacterial soap and water prior to dressing change. Prim Dressing: Hydrofera Blue Ready Foam, 2.5 x2.5 in (DME) (Generic) Every Other Day/30 Days ary Discharge Instructions: Apply to wound bed as instructed Secondary Dressing: Woven Gauze Sponge, Non-Sterile 4x4 in Every Other Day/30 Days Discharge Instructions: Apply over primary dressing as directed. Secured With: [employer], 4.5x3.1 (in/yd) Every Other Day/30 Days Discharge Instructions: Secure with Kerlix as directed. Secured With: 16M Medipore H Soft Cloth Surgical T ape, 4 x 10 (in/yd) (DME) (Generic) Every Other Day/30 Days Discharge Instructions: Secure with tape as directed. WOUND #56: - Foot Wound Laterality: Dorsal, Left Cleanser: Soap and Water Every Other Day/30 Days Discharge Instructions: May shower and wash wound with dial antibacterial soap and water prior to dressing change. Topical: Mupirocin Ointment Every Other Day/30 Days Discharge Instructions: Apply Mupirocin (Bactroban) as instructed Prim Dressing: Hydrofera Blue Classic Foam, 4x4 in (DME) (Generic) Every Other Day/30 Days ary Discharge Instructions: Moisten with saline prior to applying to wound bed Secondary Dressing: Woven Gauze Sponge, Non-Sterile 4x4 in Every Other Day/30 Days Discharge Instructions: Apply over primary dressing as directed. Secured With: [employer], 4.5x3.1 (in/yd) Every Other Day/30 Days Discharge Instructions: Secure with Kerlix as directed. Secured With: 16M Medipore H Soft Cloth Surgical T ape, 4 x 10 (in/yd) (DME) (Generic) Every Other Day/30 Days Discharge Instructions: Secure with tape as directed. WOUND #57: - Foot Wound Laterality: Plantar, Right Cleanser: Soap and Water Every Other Day/30 Days Discharge Instructions: May shower and wash wound with dial antibacterial soap and water prior to dressing change. Prim Dressing: Hydrofera Blue Ready Foam, 2.5 x2.5 in (DME) (Generic) Every Other Day/30 Days ary Discharge Instructions: Apply to wound bed as instructed Secondary Dressing: Woven Gauze Sponge, Non-Sterile 4x4 in Every Other Day/30 Days Discharge Instructions: Apply over primary dressing as directed. Secured With: [employer], 4.5x3.1 (in/yd) Every Other Day/30 Days Discharge Instructions: Secure with Kerlix as directed. Secured With: 16M Medipore H Soft Cloth Surgical T 4 x 2  (in/yd) (DME) (Generic) Every Other Day/30 Days ape Discharge Instructions: Secure dressing with tape as directed. WOUND #60: - Lower Leg Wound Laterality: Right, Lateral Cleanser: Soap and Water Every Other Day/30 Days Discharge Instructions: May shower and wash wound with dial antibacterial soap and water prior to dressing change. Prim Dressing: Hydrofera Blue Ready Foam, 2.5 x2.5 in (DME) (Generic) Every Other Day/30 Days ary Discharge Instructions:  Apply to wound bed as instructed Secondary Dressing: Woven Gauze Sponge, Non-Sterile 4x4 in Every Other Day/30 Days Discharge Instructions: Apply over primary dressing as directed. Secured With: [employer], 4.5x3.1 (in/yd) Every Other Day/30 Days Discharge Instructions: Secure with Kerlix as directed. Secured With: 45M Medipore H Soft Cloth Surgical T 4 x 2 (in/yd) (DME) (Generic) Every Other Day/30 Days ape Discharge Instructions: Secure dressing with tape as directed. 1. Still using Hydrofera Blue 2. ABDs on the buttock no adherent adhesive's. I also asked him to start the ketoconazole again under the [place] 3. I am going to prescribe topical Bactroban to the left foot. Systemic Nuzyra between the resistances of the Enterococcus and his allergies which include penicillin, linezolid, sulfa I cannot think of anything else to use here. 4. I continue to see him weekly because of the complexity of this case. Electronic Signature(s) Signed: 12/08/2021 4:48:32 PM By: Linton Ham MD Entered By: Linton Ham on 12/08/2021 09:19:10 -------------------------------------------------------------------------------- SuperBill Details Patient Name: Date of Service: [place], [name] 12/08/2021 Medical Record Number: [id number] Patient Account Number: [id number] Date of Birth/Sex: Treating RN: Apr 06, 1986 (36 y.o. [name] Primary Care Provider: [place], [place] Other Clinician: Referring Provider: Treating Provider/Extender:  [name]: 309 Diagnosis Coding ICD-10 Codes Code Description I87.332 Chronic venous hypertension (idiopathic) with ulcer and inflammation of left lower extremity L97.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, stage 3 L97.821 Non-pressure chronic ulcer of other part of left lower leg limited to breakdown of skin L97.518 Non-pressure chronic ulcer of other part of right foot with other 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, 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 Non-pressure chronic ulcer of other part of left foot limited to breakdown of skin L03.115 Cellulitis of right lower limb Facility Procedures CPT4 Code: [id number] Description: 58850 - WOUND CARE VISIT-LEV 5 EST PT Modifier: Quantity: 1 Physician Procedures : CPT4 Code Description Modifier [id number] - WC PHYS LEVEL 4 - EST PT ICD-10 Diagnosis Description L97.521 Non-pressure chronic ulcer of other part of left foot limited to breakdown of skin L97.821 Non-pressure chronic ulcer of other part of left  lower leg limited to breakdown of 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, stage 3 Quantity: 1 Electronic Signature(s) Signed: 12/09/2021 3:43:46 PM By: Linton Ham MD Signed: 12/09/2021 4:57:53 PM By: Rhae Hammock RN Previous Signature: 12/08/2021 4:48:32 PM Version By: Linton Ham MD Entered By: Rhae Hammock on 12/08/2021 17:52:51

## 2021-12-09 DIAGNOSIS — G8310 Monoplegia of lower limb affecting unspecified side: Secondary | ICD-10-CM | POA: Diagnosis not present

## 2021-12-09 DIAGNOSIS — R350 Frequency of micturition: Secondary | ICD-10-CM | POA: Diagnosis not present

## 2021-12-09 DIAGNOSIS — I10 Essential (primary) hypertension: Secondary | ICD-10-CM | POA: Diagnosis not present

## 2021-12-09 DIAGNOSIS — Z79899 Other long term (current) drug therapy: Secondary | ICD-10-CM | POA: Diagnosis not present

## 2021-12-09 NOTE — Progress Notes (Signed)
[name] [name] GOBIN ([id number]) Visit Report for 12/08/2021 Arrival Information Details Patient Name: Date of Service: [name] [name] [name] 12/08/2021 8:00 [name] [name] Medical Record Number: [id number] Patient Account Number: [id number] Date of Birth/Sex: Treating [name]: 28-Oct-1986 (36 y.o. [name] [name] Primary Care [name] [name]: [place], [place] Other Clinician: Referring [name] [name]: Treating [name] [name]/Extender: [name] [name] in Treatment: 78 Visit Information History Since Last Visit Added or deleted any medications: No Patient Arrived: Wheel Chair Any new allergies or adverse reactions: No Arrival Time: 07:57 Had [name] fall or experienced change in No Accompanied By: self activities of daily living that may affect Transfer Assistance: None risk of falls: Patient Identification Verified: Yes Signs or symptoms of abuse/neglect since last visito No Patient Requires Transmission-Based Precautions: No Hospitalized since last visit: No Patient Has Alerts: Yes Implantable device outside of the clinic excluding No Patient Alerts: R ABI = 1.0 cellular tissue based products placed in the center [name] = 1.1 since last visit: Has Dressing in Place as Prescribed: Yes Pain Present Now: No Electronic Signature(s) Signed: 12/08/2021 5:55:40 PM By: [name] [name] Entered By: [name] Catholic on 12/08/2021 08:03:22 -------------------------------------------------------------------------------- Clinic Level of Care Assessment Details Patient Name: Date of Service: [name] [name] [name] 12/08/2021 8:00 [name] [name] Medical Record Number: [id number] Patient Account Number: [id number] Date of Birth/Sex: Treating [name]: September 16, 1986 (36 y.o. [name] [name] Primary Care [name] [name]: [name], [name] Other Clinician: Referring [name] [name]: Treating Khush Pasion/Extender: [name] [name] in Treatment: [facility] Level of Care Assessment Items TOOL 4 Quantity Score X- 1 0 Use when only an EandM is  performed on FOLLOW-UP visit ASSESSMENTS - Nursing Assessment / Reassessment X- 1 10 Reassessment of Co-morbidities (includes updates in patient status) X- 1 5 Reassessment of Adherence to Treatment Plan ASSESSMENTS - Wound and Skin [name] ssessment / Reassessment []  - 0 Simple Wound Assessment / Reassessment - one wound X- 6 5 Complex Wound Assessment / Reassessment - multiple wounds []  - 0 Dermatologic / Skin Assessment (not related to wound area) ASSESSMENTS - Focused Assessment X- 1 5 Circumferential Edema Measurements - multi extremities []  - 0 Nutritional Assessment / Counseling / Intervention []  - 0 Lower Extremity Assessment (monofilament, tuning fork, pulses) []  - 0 Peripheral Arterial Disease Assessment (using hand held doppler) ASSESSMENTS - Ostomy and/or Continence Assessment and Care []  - 0 Incontinence Assessment and Management []  - 0 Ostomy Care Assessment and Management (repouching, etc.) PROCESS - Coordination of Care []  - 0 Simple Patient / Family Education for ongoing care X- 1 20 Complex (extensive) Patient / Family Education for ongoing care X- 1 10 Staff obtains Programmer, systems, Records, T Results / Process Orders est []  - 0 Staff telephones HHA, Nursing Homes / Clarify orders / etc []  - 0 Routine Transfer to another Facility (non-emergent condition) []  - 0 Routine Hospital Admission (non-emergent condition) []  - 0 New Admissions / Biomedical engineer / Ordering NPWT Apligraf, etc. , []  - 0 Emergency Hospital Admission (emergent condition) []  - 0 Simple Discharge Coordination X- 1 15 Complex (extensive) Discharge Coordination PROCESS - Special Needs []  - 0 Pediatric / Minor Patient Management []  - 0 Isolation Patient Management []  - 0 Hearing / Language / Visual special needs []  - 0 Assessment of Community assistance (transportation, D/C planning, etc.) []  - 0 Additional assistance / Altered mentation []  - 0 Support Surface(s) Assessment  (bed, cushion, seat, etc.) INTERVENTIONS - Wound Cleansing / Measurement []  - 0 Simple Wound Cleansing - one wound X- 6 5 Complex Wound  Cleansing - multiple wounds X- 1 5 Wound Imaging (photographs - any number of wounds) []  - 0 Wound Tracing (instead of photographs) []  - 0 Simple Wound Measurement - one wound X- 6 5 Complex Wound Measurement - multiple wounds INTERVENTIONS - Wound Dressings []  - 0 Small Wound Dressing one or multiple wounds []  - 0 Medium Wound Dressing one or multiple wounds X- 6 20 Large Wound Dressing one or multiple wounds []  - 0 Application of Medications - topical []  - 0 Application of Medications - injection INTERVENTIONS - Miscellaneous []  - 0 External ear exam []  - 0 Specimen Collection (cultures, biopsies, blood, body fluids, etc.) []  - 0 Specimen(s) / Culture(s) sent or taken to Lab for analysis []  - 0 Patient Transfer (multiple staff / Civil Service fast streamer / Similar devices) []  - 0 Simple Staple / Suture removal (25 or less) []  - 0 Complex Staple / Suture removal (26 or more) []  - 0 Hypo / Hyperglycemic Management (close monitor of Blood Glucose) []  - 0 Ankle / Brachial Index (ABI) - do not check if billed separately X- 1 5 Vital Signs Has the patient been seen at the hospital within the last three years: Yes Total Score: 285 Level Of Care: New/Established - Level 5 Electronic Signature(s) Signed: 12/09/2021 4:57:53 PM By: [name] [name] Entered By: [name] Hammock on 12/08/2021 17:52:43 -------------------------------------------------------------------------------- Encounter Discharge Information Details Patient Name: Date of Service: [name] [name] [name] 12/08/2021 8:00 [name] [name] Medical Record Number: [id number] Patient Account Number: [id number] Date of Birth/Sex: Treating [name]: [birth year] (36 y.o. [name] [name] Primary Care Chima Astorino: [place], [place] Other Clinician: Referring Jamoni [name]: Treating [name] [name]/Extender: [name] [name] in Treatment: [place] Encounter Discharge Information Items Discharge Condition: Stable Ambulatory Status: Wheelchair Discharge Destination: Home Transportation: Private Auto Accompanied By: self Schedule Follow-up Appointment: Yes Clinical Summary of Care: Patient Declined Electronic Signature(s) Signed: 12/08/2021 5:55:40 PM By: [name] [name] Entered By: [name] Catholic on 12/08/2021 17:22:31 -------------------------------------------------------------------------------- Lower Extremity Assessment Details Patient Name: Date of Service: [name] [name] [name] 12/08/2021 8:00 [name] [name] Medical Record Number: [id number] Patient Account Number: [id number] Date of Birth/Sex: Treating [name]: [birth year] (36 y.o. [name] [name] Primary Care Dvontae Ruan: [place], [place] Other Clinician: Referring Maiana Hennigan: Treating Sonal Dorwart/Extender: [name] [name] in Treatment: 309 Edema Assessment Assessed: [Left: No] [Right: No] Edema: [Left: Yes] [Right: Yes] Calf Left: Right: Point of Measurement: 33 cm From Medial Instep 28.5 cm 33.6 cm Ankle Left: Right: Point of Measurement: 10 cm From Medial Instep 25.3 cm 24.5 cm Electronic Signature(s) Signed: 12/08/2021 5:55:40 PM By: [name] [name] Entered By: [name] Catholic on 12/08/2021 08:21:19 -------------------------------------------------------------------------------- Multi Wound Chart Details Patient Name: Date of Service: [name] [name] [place] 12/08/2021 8:00 [name] [name] Medical Record Number: [id number] Patient Account Number: [id number] Date of Birth/Sex: Treating [name]: [birth year] (36 y.o. [name] Primary Care Jaymen Fetch: [name], [name] Other Clinician: Referring Casen Pryor: Treating [place] Current/Extender: Dutch Gray, [name] [name] in Treatment: 309 Vital Signs Height(in): 70 Pulse(bpm): 127 Weight(lbs): 216 Blood Pressure(mmHg): 153/77 Body Mass Index(BMI): 31 Temperature(F): 98.2 Respiratory  Rate(breaths/min): 18 Photos: Left Ischium Left, Anterior Lower Leg Right, Dorsal Foot Wound Location: Gradually Appeared Trauma Gradually Appeared Wounding Event: Pressure Ulcer Trauma, Other Trauma, Other Primary Etiology: Sleep Apnea, Hypertension, Paraplegia Sleep Apnea, Hypertension, Paraplegia Sleep Apnea, Hypertension, Paraplegia Comorbid History: 03/16/2020 01/26/2021 03/27/2021 Date Acquired: 90 42 36 [name] of Treatment: Open Open Open Wound Status: Yes Yes No Wound Recurrence: Yes No No Clustered Wound: 2 N/[name] N/[name] Clustered Quantity: 11x4.4x0.3  1.1x0.5x0.1 1x5x0.1 Measurements [name] x W x D (cm) 38.013 0.432 3.927 [name] (cm) : rea 11.404 0.043 0.393 Volume (cm) : -2004.80% -30.90% -108.30% % Reduction in [name] rea: -6200.60% -30.30% -109.00% % Reduction in Volume: Category/Stage II Full Thickness Without Exposed Full Thickness Without Exposed Classification: Support Structures Support Structures Medium Medium Medium Exudate Amount: Sanguinous Serosanguineous Serosanguineous Exudate Type: red red, brown red, brown Exudate Color: Distinct, outline attached Flat and Intact Distinct, outline attached Wound Margin: Large (67-100%) Large (67-100%) Small (1-33%) Granulation Amount: Red, Friable Red Pink, Pale Granulation Quality: None Present (0%) None Present (0%) Large (67-100%) Necrotic Amount: Fat Layer (Subcutaneous Tissue): Yes Fat Layer (Subcutaneous Tissue): Yes Fat Layer (Subcutaneous Tissue): Yes Exposed Structures: Fascia: No Fascia: No Fascia: No Tendon: No Tendon: No Tendon: No Muscle: No Muscle: No Muscle: No Joint: No Joint: No Joint: No Bone: No Bone: No Bone: No None None None Epithelialization: Wound Number: [id number] Photos: Left, Dorsal Foot Right, Plantar Foot Right, Lateral Lower Leg Wound Location: Gradually Appeared Gradually Appeared Gradually Appeared Wounding Event: 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 T  be determined o Primary Etiology: Sleep Apnea, Hypertension, Paraplegia Sleep Apnea, Hypertension, Paraplegia Sleep Apnea, Hypertension, Paraplegia Comorbid History: 07/11/2021 08/25/2021 12/08/2021 Date Acquired: 20 15 0 [name] of Treatment: Open Open Open Wound Status: No No No Wound Recurrence: No No No Clustered Wound: N/[name] N/[name] N/[name] Clustered Quantity: 4.1x1.4x0.1 0.1x0.3x0.1 0.2x0.2x0.1 Measurements [name] x W x D (cm) 4.508 0.024 0.031 [name] (cm) : rea 0.451 0.002 0.003 Volume (cm) : 27.50% 97.60% N/[name] % Reduction in [name] rea: 27.50% 98.00% N/[name] % Reduction in Volume: Full Thickness Without Exposed Full Thickness Without Exposed Full Thickness Without Exposed Classification: Support Structures Support Structures Support Structures Medium Medium Medium Exudate Amount: Serosanguineous Serosanguineous Serosanguineous Exudate Type: red, brown red, brown red, brown Exudate Color: Distinct, outline attached Distinct, outline attached Distinct, outline attached Wound Margin: Medium (34-66%) Medium (34-66%) Large (67-100%) Granulation Amount: Pink, Pale Pink Red Granulation Quality: Medium (34-66%) Medium (34-66%) None Present (0%) Necrotic Amount: Fat Layer (Subcutaneous Tissue): Yes Fat Layer (Subcutaneous Tissue): Yes Fat Layer (Subcutaneous Tissue): Yes Exposed Structures: Fascia: No Fascia: No Fascia: No Tendon: No Tendon: No Tendon: No Muscle: No Muscle: No Muscle: No Joint: No Joint: No Joint: No Bone: No Bone: No Bone: No Small (1-33%) Small (1-33%) None Epithelialization: Treatment Notes Electronic Signature(s) Signed: 12/08/2021 4:48:32 PM By: Linton Ham MD Signed: 12/09/2021 5:10:09 PM By: Levan Hurst [name], BSN Entered By: Linton Ham on 12/08/2021 09:14:45 -------------------------------------------------------------------------------- Multi-Disciplinary Care Plan Details Patient Name: Date of Service: [name] [name] [name] 12/08/2021 8:00 [name]  [name] Medical Record Number: [id number] Patient Account Number: [id number] Date of Birth/Sex: Treating [name]: 1986-09-19 (36 y.o. [name] [name] Primary Care Debroah Shuttleworth: [name], [name] Other Clinician: Referring Erick Oxendine: Treating Rane Dumm/Extender: [name] [name] in Treatment: [place] reviewed with physician Active Inactive Wound/Skin Impairment Nursing Diagnoses: Impaired tissue integrity Knowledge deficit related to ulceration/compromised skin integrity Goals: Patient/caregiver will verbalize understanding of skin care regimen Date Initiated: 01/05/2016 Target Resolution Date: 12/18/2021 Goal Status: Active Ulcer/skin breakdown will have [name] volume reduction of 30% by week 4 Date Initiated: 01/05/2016 Date Inactivated: 12/22/2017 Target Resolution Date: 01/19/2018 Unmet Reason: complex wounds, Goal Status: Unmet infection Interventions: Assess patient/caregiver ability to obtain necessary supplies Assess ulceration(s) every visit Provide education on ulcer and skin care Notes: 02/02/21: Complex Care, ongoing. Electronic Signature(s) Signed: 12/08/2021 5:55:40 PM By: [name] [name] Entered By: [name] Catholic on 12/08/2021 08:58:38 --------------------------------------------------------------------------------  Pain Assessment Details Patient Name: Date of Service: [name] [name] [name] 12/08/2021 8:00 [name] [name] Medical Record Number: [id number] Patient Account Number: [id number] Date of Birth/Sex: Treating [name]: [birth year] (36 y.o. [name] [name] Primary Care Verland Sprinkle: [place], [place] Other Clinician: Referring Jahmeek Shirk: Treating Alysiana Ethridge/Extender: [name] [name] in Treatment: 309 Active Problems Location of Pain Severity and Description of Pain Patient Has Paino No Site Locations Pain Management and Medication Current Pain Management: Electronic Signature(s) Signed: 12/08/2021 5:55:40 PM By: [name] Catholic  [name] Entered By: [name] Catholic on 12/08/2021 08:04:38 -------------------------------------------------------------------------------- Patient/Caregiver Education Details Patient Name: Date of Service: [name] [name] [name] 1/17/2023andnbsp8:00 [name] [name] Medical Record Number: [id number] Patient Account Number: [id number] Date of Birth/Gender: Treating [name]: 1986-06-12 (36 y.o. [name] [name] Primary Care Physician: Janine Limbo Other Clinician: Referring Physician: Treating Physician/Extender: [name] [name] in Treatment: [place] Education Assessment Education Provided To: Patient Education Topics Provided Wound/Skin Impairment: Methods: Explain/Verbal Responses: Return demonstration correctly Electronic Signature(s) Signed: 12/08/2021 5:55:40 PM By: [name] [name] Entered By: [name] Catholic on 12/08/2021 08:59:07 -------------------------------------------------------------------------------- Wound Assessment Details Patient Name: Date of Service: [name] [name] [name] 12/08/2021 8:00 [name] [name] Medical Record Number: [id number] Patient Account Number: [id number] Date of Birth/Sex: Treating [name]: [birth year] (36 y.o. [name] [name] Primary Care Chelsy Parrales: [name], [name] Other Clinician: Referring Athaliah Baumbach: Treating Tenicia Gural/Extender: [name] [name] in Treatment: 309 Wound Status Wound Number: [id number] Primary Etiology: Pressure Ulcer Wound Location: Left Ischium Wound Status: Open Wounding Event: Gradually Appeared Comorbid History: Sleep Apnea, Hypertension, Paraplegia Date Acquired: 03/16/2020 [name] Of Treatment: 90 Clustered Wound: Yes Photos Photo Uploaded By: Donavan Burnet on 12/08/2021 09:04:51 Wound Measurements Length: (cm) 11 Width: (cm) 4.4 Depth: (cm) 0.3 Clustered Quantity: 2 Area: (cm) 38.013 Volume: (cm) 11.404 % Reduction in Area: -2004.8% % Reduction in Volume: -6200.6% Epithelialization: None Tunneling: No Undermining:  No Wound Description Classification: Category/Stage II Wound Margin: Distinct, outline attached Exudate Amount: Medium Exudate Type: Sanguinous Exudate Color: red Wound Bed Granulation Amount: Large (67-100%) Granulation Quality: Red, Friable Necrotic Amount: None Present (0%) Foul Odor After Cleansing: No Slough/Fibrino No Exposed Structure Fascia Exposed: No Fat Layer (Subcutaneous Tissue) Exposed: Yes Tendon Exposed: No Muscle Exposed: No Joint Exposed: No Bone Exposed: No Treatment Notes Wound #[id number] (Ischium) Wound Laterality: Left Cleanser Soap and Water Discharge Instruction: May shower and wash wound with dial antibacterial soap and water prior to dressing change. Peri-Wound Care Ketoconazole Cream 2% Discharge Instruction: Apply Ketoconazole as directed Triad Hydrophilic Wound Dressing Tube, 6 (oz) Discharge Instruction: Apply to periwound with each dressing change Topical Primary Dressing Hydrofera Blue Ready Foam, 2.5 x2.5 in Discharge Instruction: Apply to wound bed as instructed Secondary Dressing ABD Pad, 5x9 Discharge Instruction: Apply over primary dressing as directed. Secured With 54M Medipore H Soft Cloth Surgical T 4 x 2 (in/yd) ape Discharge Instruction: Secure dressing with tape as directed. Compression Wrap Compression Stockings Add-Ons Electronic Signature(s) Signed: 12/08/2021 5:55:40 PM By: [name] [name] Entered By: [name] Catholic on 12/08/2021 08:42:11 -------------------------------------------------------------------------------- Wound Assessment Details Patient Name: Date of Service: [name] [name] [name] 12/08/2021 8:00 [name] [name] Medical Record Number: [id number] Patient Account Number: [id number] Date of Birth/Sex: Treating [name]: Dec 21, 1985 (36 y.o. [name] [name] Primary Care Dorwin Fitzhenry: [place], [place] Other Clinician: Referring Aubrey Voong: Treating Takiya Belmares/Extender: [name] [name] in Treatment: 309 Wound  Status Wound Number: [id number] Primary Etiology: Trauma, Other Wound Location: Left, Anterior Lower Leg Wound Status: Open Wounding Event: Trauma Comorbid History: Sleep Apnea, Hypertension, Paraplegia Date Acquired:  01/26/2021 [name] Of Treatment: 42 Clustered Wound: No Photos Photo Uploaded By: Donavan Burnet on 12/08/2021 09:04:51 Wound Measurements Length: (cm) 1.1 Width: (cm) 0.5 Depth: (cm) 0.1 Area: (cm) 0.432 Volume: (cm) 0.043 % Reduction in Area: -30.9% % Reduction in Volume: -30.3% Epithelialization: None Tunneling: No Undermining: No Wound Description Classification: Full Thickness Without Exposed Support Structures Wound Margin: Flat and Intact Exudate Amount: Medium Exudate Type: Serosanguineous Exudate Color: red, brown Foul Odor After Cleansing: No Slough/Fibrino No Wound Bed Granulation Amount: Large (67-100%) Exposed Structure Granulation Quality: Red Fascia Exposed: No Necrotic Amount: None Present (0%) Fat Layer (Subcutaneous Tissue) Exposed: Yes Tendon Exposed: No Muscle Exposed: No Joint Exposed: No Bone Exposed: No Treatment Notes Wound #51R (Lower Leg) Wound Laterality: Left, Anterior Cleanser Soap and Water Discharge Instruction: May shower and wash wound with dial antibacterial soap and water prior to dressing change. Peri-Wound Care Topical Primary Dressing Hydrofera Blue Ready Foam, 2.5 x2.5 in Discharge Instruction: Apply to wound bed as instructed Secondary Dressing Zetuvit Plus Silicone Border Dressing 4x4 (in/in) Discharge Instruction: Apply silicone border over primary dressing as directed. Secured With 105M Medipore H Soft Cloth Surgical T 4 x 2 (in/yd) ape Discharge Instruction: Secure dressing with tape as directed. Compression Wrap Compression Stockings Add-Ons Electronic Signature(s) Signed: 12/08/2021 5:55:40 PM By: [name] [name] Entered By: [name] Catholic on 12/08/2021  08:26:37 -------------------------------------------------------------------------------- Wound Assessment Details Patient Name: Date of Service: [name] [name] [name] 12/08/2021 8:00 [name] [name] Medical Record Number: [id number] Patient Account Number: [id number] Date of Birth/Sex: Treating [name]: 1986/09/25 (36 y.o. [name] [name] Primary Care Hampton Wixom: [name], [name] Other Clinician: Referring Chani Ghanem: Treating Colen Eltzroth/Extender: [name] [name] in Treatment: 309 Wound Status Wound Number: [id number] Primary Etiology: Trauma, Other Wound Location: Right, Dorsal Foot Wound Status: Open Wounding Event: Gradually Appeared Comorbid History: Sleep Apnea, Hypertension, Paraplegia Date Acquired: 03/27/2021 [name] Of Treatment: 36 Clustered Wound: No Photos Photo Uploaded By: Donavan Burnet on 12/08/2021 09:05:26 Wound Measurements Length: (cm) 1 Width: (cm) 5 Depth: (cm) 0.1 Area: (cm) 3.927 Volume: (cm) 0.393 % Reduction in Area: -108.3% % Reduction in Volume: -109% Epithelialization: None Tunneling: No Undermining: No Wound Description Classification: Full Thickness Without Exposed Support Structures Wound Margin: Distinct, outline attached Exudate Amount: Medium Exudate Type: Serosanguineous Exudate Color: red, brown Foul Odor After Cleansing: No Slough/Fibrino Yes Wound Bed Granulation Amount: Small (1-33%) Exposed Structure Granulation Quality: Pink, Pale Fascia Exposed: No Necrotic Amount: Large (67-100%) Fat Layer (Subcutaneous Tissue) Exposed: Yes Necrotic Quality: Adherent Slough Tendon Exposed: No Muscle Exposed: No Joint Exposed: No Bone Exposed: No Treatment Notes Wound #52 (Foot) Wound Laterality: Dorsal, Right Cleanser Soap and Water Discharge Instruction: May shower and wash wound with dial antibacterial soap and water prior to dressing change. Peri-Wound Care Topical Primary Dressing Hydrofera Blue Ready Foam, 2.5 x2.5 in Discharge  Instruction: Apply to wound bed as instructed Secondary Dressing Woven Gauze Sponge, Non-Sterile 4x4 in Discharge Instruction: Apply over primary dressing as directed. Secured With [employer], 4.5x3.1 (in/yd) Discharge Instruction: Secure with Kerlix as directed. 105M Medipore H Soft Cloth Surgical T ape, 4 x 10 (in/yd) Discharge Instruction: Secure with tape as directed. Compression Wrap Compression Stockings Add-Ons Electronic Signature(s) Signed: 12/08/2021 5:55:40 PM By: [name] [name] Entered By: [name] Catholic on 12/08/2021 08:35:03 -------------------------------------------------------------------------------- Wound Assessment Details Patient Name: Date of Service: Aceituno, [name] [name] 12/08/2021 8:00 [name] [name] Medical Record Number: [id number] Patient Account Number: [id number] Date of Birth/Sex: Treating [name]: [birth year] (36 y.o. [name] [name] Primary Care Suraya Vidrine:  [name], [name] Other Clinician: Referring Janasia Coverdale: Treating Tradarius Reinwald/Extender: Dutch Gray, [name] [name] in Treatment: 309 Wound Status Wound Number: 56 Primary Etiology: Neuropathic Ulcer-Non Diabetic Wound Location: Left, Dorsal Foot Wound Status: Open Wounding Event: Gradually Appeared Comorbid History: Sleep Apnea, Hypertension, Paraplegia Date Acquired: 07/11/2021 [name] Of Treatment: 20 Clustered Wound: No Photos Photo Uploaded By: Donavan Burnet on 12/08/2021 09:05:27 Wound Measurements Length: (cm) 4.1 Width: (cm) 1.4 Depth: (cm) 0.1 Area: (cm) 4.508 Volume: (cm) 0.451 % Reduction in Area: 27.5% % Reduction in Volume: 27.5% Epithelialization: Small (1-33%) Tunneling: No Undermining: No Wound Description Classification: Full Thickness Without Exposed Support Structures Wound Margin: Distinct, outline attached Exudate Amount: Medium Exudate Type: Serosanguineous Exudate Color: red, brown Wound Bed Granulation Amount: Medium (34-66%) Granulation Quality: Pink,  Pale Necrotic Amount: Medium (34-66%) Necrotic Quality: Adherent Slough Foul Odor After Cleansing: No Slough/Fibrino Yes Exposed Structure Fascia Exposed: No Fat Layer (Subcutaneous Tissue) Exposed: Yes Tendon Exposed: No Muscle Exposed: No Joint Exposed: No Bone Exposed: No Treatment Notes Wound #56 (Foot) Wound Laterality: Dorsal, Left Cleanser Soap and Water Discharge Instruction: May shower and wash wound with dial antibacterial soap and water prior to dressing change. Peri-Wound Care Topical Mupirocin Ointment Discharge Instruction: Apply Mupirocin (Bactroban) as instructed Primary Dressing Hydrofera Blue Classic Foam, 4x4 in Discharge Instruction: Moisten with saline prior to applying to wound bed Secondary Dressing Woven Gauze Sponge, Non-Sterile 4x4 in Discharge Instruction: Apply over primary dressing as directed. Secured With [employer], 4.5x3.1 (in/yd) Discharge Instruction: Secure with Kerlix as directed. 670M Medipore H Soft Cloth Surgical T ape, 4 x 10 (in/yd) Discharge Instruction: Secure with tape as directed. Compression Wrap Compression Stockings Add-Ons Electronic Signature(s) Signed: 12/08/2021 5:55:40 PM By: [name] [name] Entered By: [name] Catholic on 12/08/2021 08:24:17 -------------------------------------------------------------------------------- Wound Assessment Details Patient Name: Date of Service: Ferrufino, [name] [name] 12/08/2021 8:00 [name] [name] Medical Record Number: [id number] Patient Account Number: [id number] Date of Birth/Sex: Treating [name]: [birth year] (36 y.o. [name] [name] Primary Care Emmett Bracknell: [name], [name] Other Clinician: Referring Theon Sobotka: Treating Arliss Frisina/Extender: [name] [name] in Treatment: 309 Wound Status Wound Number: 57 Primary Etiology: Neuropathic Ulcer-Non Diabetic Wound Location: Right, Plantar Foot Wound Status: Open Wounding Event: Gradually Appeared Comorbid History: Sleep Apnea,  Hypertension, Paraplegia Date Acquired: 08/25/2021 [name] Of Treatment: 15 Clustered Wound: No Photos Photo Uploaded By: Donavan Burnet on 12/08/2021 09:05:57 Wound Measurements Length: (cm) 0.1 Width: (cm) 0.3 Depth: (cm) 0.1 Area: (cm) 0.024 Volume: (cm) 0.002 % Reduction in Area: 97.6% % Reduction in Volume: 98% Epithelialization: Small (1-33%) Tunneling: No Undermining: No Wound Description Classification: Full Thickness Without Exposed Support Structures Wound Margin: Distinct, outline attached Exudate Amount: Medium Exudate Type: Serosanguineous Exudate Color: red, brown Foul Odor After Cleansing: No Slough/Fibrino Yes Wound Bed Granulation Amount: Medium (34-66%) Exposed Structure Granulation Quality: Pink Fascia Exposed: No Necrotic Amount: Medium (34-66%) Fat Layer (Subcutaneous Tissue) Exposed: Yes Necrotic Quality: Adherent Slough Tendon Exposed: No Muscle Exposed: No Joint Exposed: No Bone Exposed: No Treatment Notes Wound #57 (Foot) Wound Laterality: Plantar, Right Cleanser Soap and Water Discharge Instruction: May shower and wash wound with dial antibacterial soap and water prior to dressing change. Peri-Wound Care Topical Primary Dressing Hydrofera Blue Ready Foam, 2.5 x2.5 in Discharge Instruction: Apply to wound bed as instructed Secondary Dressing Woven Gauze Sponge, Non-Sterile 4x4 in Discharge Instruction: Apply over primary dressing as directed. Secured With [employer], 4.5x3.1 (in/yd) Discharge Instruction: Secure with Kerlix as directed. 670M Medipore H Soft Cloth Surgical T 4 x 2 (  in/yd) ape Discharge Instruction: Secure dressing with tape as directed. Compression Wrap Compression Stockings Add-Ons Electronic Signature(s) Signed: 12/08/2021 5:55:40 PM By: [name] [name] Entered By: [name] Catholic on 12/08/2021 08:30:11 -------------------------------------------------------------------------------- Wound Assessment  Details Patient Name: Date of Service: Bitting, [name] [name] 12/08/2021 8:00 [name] [name] Medical Record Number: [id number] Patient Account Number: [id number] Date of Birth/Sex: Treating [name]: Dec 07, 1985 (36 y.o. [name] [name] Primary Care Garnetta Fedrick: [name], [name] Other Clinician: Referring Sheron Tallman: Treating Delbert Vu/Extender: Dutch Gray, [name] [name] in Treatment: 309 Wound Status Wound Number: 60 Primary Etiology: T be determined o Wound Location: Right, Lateral Lower Leg Wound Status: Open Wounding Event: Gradually Appeared Comorbid History: Sleep Apnea, Hypertension, Paraplegia Date Acquired: 12/08/2021 [name] Of Treatment: 0 Clustered Wound: No Photos Photo Uploaded By: Donavan Burnet on 12/08/2021 09:05:57 Wound Measurements Length: (cm) 0.2 Width: (cm) 0.2 Depth: (cm) 0.1 Area: (cm) 0.031 Volume: (cm) 0.003 % Reduction in Area: % Reduction in Volume: Epithelialization: None Tunneling: No Undermining: No Wound Description Classification: Full Thickness Without Exposed Support Structures Wound Margin: Distinct, outline attached Exudate Amount: Medium Exudate Type: Serosanguineous Exudate Color: red, brown Foul Odor After Cleansing: No Slough/Fibrino No Wound Bed Granulation Amount: Large (67-100%) Exposed Structure Granulation Quality: Red Fascia Exposed: No Necrotic Amount: None Present (0%) Fat Layer (Subcutaneous Tissue) Exposed: Yes Tendon Exposed: No Muscle Exposed: No Joint Exposed: No Bone Exposed: No Treatment Notes Wound #60 (Lower Leg) Wound Laterality: Right, Lateral Cleanser Soap and Water Discharge Instruction: May shower and wash wound with dial antibacterial soap and water prior to dressing change. Peri-Wound Care Topical Primary Dressing Hydrofera Blue Ready Foam, 2.5 x2.5 in Discharge Instruction: Apply to wound bed as instructed Secondary Dressing Woven Gauze Sponge, Non-Sterile 4x4 in Discharge Instruction: Apply over primary  dressing as directed. Secured With [employer], 4.5x3.1 (in/yd) Discharge Instruction: Secure with Kerlix as directed. 80M Medipore H Soft Cloth Surgical T 4 x 2 (in/yd) ape Discharge Instruction: Secure dressing with tape as directed. Compression Wrap Compression Stockings Add-Ons Electronic Signature(s) Signed: 12/08/2021 5:55:40 PM By: [name] [name] Entered By: [name] Catholic on 12/08/2021 08:47:31 -------------------------------------------------------------------------------- Vitals Details Patient Name: Date of Service: Rudin, [name] [name] 12/08/2021 8:00 [name] [name] Medical Record Number: [id number] Patient Account Number: [id number] Date of Birth/Sex: Treating [name]: 1986-07-05 (36 y.o. [name] [name] Primary Care Shelbey Spindler: Farmer, [place] Other Clinician: Referring Aimee Heldman: Treating Jeanenne Licea/Extender: [name] [name] in Treatment: 309 Vital Signs Time Taken: 09:03 Temperature (F): 98.2 Height (in): 70 Pulse (bpm): 127 Weight (lbs): 216 Respiratory Rate (breaths/min): 18 Body Mass Index (BMI): 31 Blood Pressure (mmHg): 153/77 Reference Range: 80 - 120 mg / dl Electronic Signature(s) Signed: 12/08/2021 5:55:40 PM By: [name] [name] Entered By: [name] Catholic on 12/08/2021 08:19:16

## 2021-12-10 ENCOUNTER — Other Ambulatory Visit (HOSPITAL_COMMUNITY): Payer: Self-pay

## 2021-12-10 DIAGNOSIS — I87332 Chronic venous hypertension (idiopathic) with ulcer and inflammation of left lower extremity: Secondary | ICD-10-CM | POA: Diagnosis not present

## 2021-12-10 MED ORDER — NUZYRA 150 MG PO TABS
ORAL_TABLET | ORAL | 0 refills | Status: DC
  Filled 2021-12-10: qty 18, 8d supply, fill #0

## 2021-12-11 ENCOUNTER — Other Ambulatory Visit (HOSPITAL_COMMUNITY): Payer: Self-pay

## 2021-12-15 ENCOUNTER — Other Ambulatory Visit: Payer: Self-pay

## 2021-12-15 ENCOUNTER — Other Ambulatory Visit (HOSPITAL_COMMUNITY): Payer: Self-pay

## 2021-12-15 ENCOUNTER — Encounter (HOSPITAL_BASED_OUTPATIENT_CLINIC_OR_DEPARTMENT_OTHER): Payer: BC Managed Care – PPO | Admitting: Internal Medicine

## 2021-12-15 DIAGNOSIS — G8221 Paraplegia, complete: Secondary | ICD-10-CM | POA: Diagnosis not present

## 2021-12-15 DIAGNOSIS — L97512 Non-pressure chronic ulcer of other part of right foot with fat layer exposed: Secondary | ICD-10-CM | POA: Diagnosis not present

## 2021-12-15 DIAGNOSIS — L97821 Non-pressure chronic ulcer of other part of left lower leg limited to breakdown of skin: Secondary | ICD-10-CM | POA: Diagnosis not present

## 2021-12-15 DIAGNOSIS — L89323 Pressure ulcer of left buttock, stage 3: Secondary | ICD-10-CM | POA: Diagnosis not present

## 2021-12-15 DIAGNOSIS — I87332 Chronic venous hypertension (idiopathic) with ulcer and inflammation of left lower extremity: Secondary | ICD-10-CM | POA: Diagnosis not present

## 2021-12-15 DIAGNOSIS — L03115 Cellulitis of right lower limb: Secondary | ICD-10-CM | POA: Diagnosis not present

## 2021-12-15 DIAGNOSIS — L97521 Non-pressure chronic ulcer of other part of left foot limited to breakdown of skin: Secondary | ICD-10-CM | POA: Diagnosis not present

## 2021-12-15 DIAGNOSIS — L97812 Non-pressure chronic ulcer of other part of right lower leg with fat layer exposed: Secondary | ICD-10-CM | POA: Diagnosis not present

## 2021-12-15 DIAGNOSIS — L97518 Non-pressure chronic ulcer of other part of right foot with other specified severity: Secondary | ICD-10-CM | POA: Diagnosis not present

## 2021-12-15 DIAGNOSIS — L97511 Non-pressure chronic ulcer of other part of right foot limited to breakdown of skin: Secondary | ICD-10-CM | POA: Diagnosis not present

## 2021-12-15 DIAGNOSIS — L97522 Non-pressure chronic ulcer of other part of left foot with fat layer exposed: Secondary | ICD-10-CM | POA: Diagnosis not present

## 2021-12-15 DIAGNOSIS — I87333 Chronic venous hypertension (idiopathic) with ulcer and inflammation of bilateral lower extremity: Secondary | ICD-10-CM | POA: Diagnosis not present

## 2021-12-17 NOTE — Progress Notes (Signed)
Hack, [name] ([id number]) Visit Report for 12/15/2021 Arrival Information Details Patient Name: Date of Service: [name], [name] 12/15/2021 8:00 [name] Medical Record Number: [id number] Patient Account Number: [id number] Date of Birth/Sex: Treating [name]: [birth year] (36 y.o. [name] Other Clinician: Referring [name]: Treating [name] in Treatment: 110 Visit Information History Since Last Visit Added or deleted any medications: No Patient Arrived: Wheel Chair Any new allergies or adverse reactions: No Arrival Time: 07:55 Had [name] fall or experienced change in No Accompanied By: self activities of daily living that may affect Transfer Assistance: Manual risk of falls: Patient Identification Verified: Yes Signs or symptoms of abuse/neglect since last visito No Secondary Verification Process Completed: Yes Hospitalized since last visit: No Patient Requires Transmission-Based Precautions: No Implantable device outside of the clinic excluding No Patient Has Alerts: Yes cellular tissue based products placed in the center Patient Alerts: R ABI = 1.0 since last visit: L ABI = 1.1 Has Dressing in Place as Prescribed: Yes Pain Present Now: No Electronic Signature(s) Signed: 12/17/2021 5:37:29 PM By: [name] Entered By: [name] on 12/15/2021 07:55:36 -------------------------------------------------------------------------------- Clinic Level of Care Assessment Details Patient Name: Date of Service: [name], [name] 12/15/2021 8:00 [name] Medical Record Number: [id number] Patient Account Number: [id number] Date of Birth/Sex: Treating [name]: 10-19-1986 (36 y.o. [name] Primary Care [name] Other Clinician: Referring Kevionna Heffler: Treating Andie Mungin/Extender: Malachi Carl [name] in Treatment: [facility] Level of Care Assessment Items TOOL 4  Quantity Score X- 1 0 Use when only an EandM is performed on FOLLOW-UP visit ASSESSMENTS - Nursing Assessment / Reassessment X- 1 10 Reassessment of Co-morbidities (includes updates in patient status) X- 1 5 Reassessment of Adherence to Treatment Plan ASSESSMENTS - Wound and Skin [name] ssessment / Reassessment []  - 0 Simple Wound Assessment / Reassessment - one wound X- 6 5 Complex Wound Assessment / Reassessment - multiple wounds []  - 0 Dermatologic / Skin Assessment (not related to wound area) ASSESSMENTS - Focused Assessment X- 1 5 Circumferential Edema Measurements - multi extremities []  - 0 Nutritional Assessment / Counseling / Intervention []  - 0 Lower Extremity Assessment (monofilament, tuning fork, pulses) []  - 0 Peripheral Arterial Disease Assessment (using hand held doppler) ASSESSMENTS - Ostomy and/or Continence Assessment and Care []  - 0 Incontinence Assessment and Management []  - 0 Ostomy Care Assessment and Management (repouching, etc.) PROCESS - Coordination of Care []  - 0 Simple Patient / Family Education for ongoing care X- 1 20 Complex (extensive) Patient / Family Education for ongoing care X- 1 10 Staff obtains Programmer, systems, Records, T Results / Process Orders est []  - 0 Staff telephones HHA, Nursing Homes / Clarify orders / etc []  - 0 Routine Transfer to another Facility (non-emergent condition) []  - 0 Routine Hospital Admission (non-emergent condition) []  - 0 New Admissions / Biomedical engineer / Ordering NPWT Apligraf, etc. , []  - 0 Emergency Hospital Admission (emergent condition) []  - 0 Simple Discharge Coordination X- 1 15 Complex (extensive) Discharge Coordination PROCESS - Special Needs []  - 0 Pediatric / Minor Patient Management []  - 0 Isolation Patient Management []  - 0 Hearing / Language / Visual special needs []  - 0 Assessment of Community assistance (transportation, D/C planning, etc.) []  - 0 Additional assistance / Altered  mentation []  - 0 Support Surface(s) Assessment (bed, cushion, seat, etc.) INTERVENTIONS - Wound Cleansing / Measurement []  - 0 Simple Wound Cleansing - one wound  X- 6 5 Complex Wound Cleansing - multiple wounds X- 1 5 Wound Imaging (photographs - any number of wounds) []  - 0 Wound Tracing (instead of photographs) []  - 0 Simple Wound Measurement - one wound X- 6 5 Complex Wound Measurement - multiple wounds INTERVENTIONS - Wound Dressings []  - 0 Small Wound Dressing one or multiple wounds X- 6 15 Medium Wound Dressing one or multiple wounds []  - 0 Large Wound Dressing one or multiple wounds []  - 0 Application of Medications - topical []  - 0 Application of Medications - injection INTERVENTIONS - Miscellaneous []  - 0 External ear exam []  - 0 Specimen Collection (cultures, biopsies, blood, body fluids, etc.) []  - 0 Specimen(s) / Culture(s) sent or taken to Lab for analysis []  - 0 Patient Transfer (multiple staff / Civil Service fast streamer / Similar devices) []  - 0 Simple Staple / Suture removal (25 or less) []  - 0 Complex Staple / Suture removal (26 or more) []  - 0 Hypo / Hyperglycemic Management (close monitor of Blood Glucose) []  - 0 Ankle / Brachial Index (ABI) - do not check if billed separately X- 1 5 Vital Signs Has the patient been seen at the hospital within the last three years: Yes Total Score: 255 Level Of Care: New/Established - Level 5 Electronic Signature(s) Signed: 12/17/2021 5:37:29 PM By: [name] Entered By: [name] on 12/15/2021 08:28:45 -------------------------------------------------------------------------------- Encounter Discharge Information Details Patient Name: Date of Service: [name], [name] 12/15/2021 8:00 [name] Medical Record Number: [id number] Patient Account Number: [id number] Date of Birth/Sex: Treating [name]: [birth year] (36 y.o. [name] Primary Care [name] Other Clinician: Referring  Antwon Rochin: Treating Odelle Kosier/Extender: Malachi Carl [name] in Treatment: 310 Encounter Discharge Information Items Discharge Condition: Stable Ambulatory Status: Wheelchair Discharge Destination: Home Transportation: Private Auto Accompanied By: self Schedule Follow-up Appointment: Yes Clinical Summary of Care: Patient Declined Electronic Signature(s) Signed: 12/17/2021 5:37:29 PM By: [name] Entered By: [name] on 12/15/2021 08:38:52 -------------------------------------------------------------------------------- Lower Extremity Assessment Details Patient Name: Date of Service: [name], [name] 12/15/2021 8:00 [name] Medical Record Number: [id number] Patient Account Number: [id number] Date of Birth/Sex: Treating [name]: 12-05-1985 (36 y.o. [name] Primary Care Adrieana Fennelly: [place] Other Clinician: Referring Trinette Vera: Treating Iyanla Eilers/Extender: Malachi Carl [name] in Treatment: 310 Edema Assessment Assessed: [Left: Yes] [Right: Yes] Edema: [Left: Yes] [Right: Yes] Calf Left: Right: Point of Measurement: 33 cm From Medial Instep 28.5 cm 33.6 cm Ankle Left: Right: Point of Measurement: 10 cm From Medial Instep 25.3 cm 24.5 cm Vascular Assessment Pulses: Dorsalis Pedis Palpable: [Left:Yes] [Right:Yes] Posterior Tibial Palpable: [Left:Yes] [Right:Yes] Electronic Signature(s) Signed: 12/17/2021 5:37:29 PM By: [name] Entered By: [name] on 12/15/2021 07:56:18 -------------------------------------------------------------------------------- Multi Wound Chart Details Patient Name: Date of Service: [name], [name] 12/15/2021 8:00 [name] Medical Record Number: [id number] Patient Account Number: [id number] Date of Birth/Sex: Treating [name]: 02-Oct-1986 (36 y.o. [name] Primary Care Sharlene Mccluskey: [name] Other Clinician: Referring Brittyn Salaz: Treating Benigno Check/Extender: Dutch Gray, [name] [name] in Treatment: 310 Vital Signs Height(in): 70 Pulse(bpm): 120 Weight(lbs): 216 Blood Pressure(mmHg): 129/65 Body Mass Index(BMI): 31 Temperature(F): 98.1 Respiratory Rate(breaths/min): 17 Photos: Left Ischium Left, Anterior Lower Leg Right, Dorsal Foot Wound Location: Gradually Appeared Trauma Gradually Appeared Wounding Event: Pressure Ulcer Trauma, Other Trauma, Other Primary Etiology: Sleep Apnea, Hypertension, Paraplegia Sleep Apnea, Hypertension, Paraplegia Sleep Apnea, Hypertension, Paraplegia Comorbid History: 03/16/2020 01/26/2021 03/27/2021 Date Acquired: 91 43 37 [name] of Treatment: Open Open Open  Wound Status: Yes Yes No Wound Recurrence: Yes No No Clustered Wound: 3 [name] [name] Clustered Quantity: 7x4x0.1 0.2x0.2x0.1 2x4x0.1 Measurements L x W x D (cm) 21.991 0.031 6.283 [name] (cm) : rea 2.199 0.003 0.628 Volume (cm) : -1117.70% 90.60% -233.30% % Reduction in [name] rea: -1114.90% 90.90% -234.00% % Reduction in Volume: Category/Stage II Full Thickness Without Exposed Full Thickness Without Exposed Classification: Support Structures Support Structures Medium Medium Medium Exudate Amount: Sanguinous Serosanguineous Serosanguineous Exudate Type: red red, brown red, brown Exudate Color: Distinct, outline attached Flat and Intact Distinct, outline attached Wound Margin: Large (67-100%) Large (67-100%) Small (1-33%) Granulation Amount: Red, Friable Red Pink, Pale Granulation Quality: None Present (0%) None Present (0%) Large (67-100%) Necrotic Amount: Fat Layer (Subcutaneous Tissue): Yes Fat Layer (Subcutaneous Tissue): Yes Fat Layer (Subcutaneous Tissue): Yes Exposed Structures: Fascia: No Fascia: No Fascia: No Tendon: No Tendon: No Tendon: No Muscle: No Muscle: No Muscle: No Joint: No Joint: No Joint: No Bone: No Bone: No Bone: No None None None Epithelialization: Wound Number: [id number] Photos: No Photos Left,  Dorsal Foot Right, Plantar Foot Right, Lateral Lower Leg Wound Location: Gradually Appeared Gradually Appeared Gradually Appeared Wounding Event: 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 T be determined o Primary Etiology: Sleep Apnea, Hypertension, Paraplegia Sleep Apnea, Hypertension, Paraplegia Sleep Apnea, Hypertension, Paraplegia Comorbid History: 07/11/2021 08/25/2021 12/08/2021 Date Acquired: 21 16 1  [name] of Treatment: Open Open Open Wound Status: No No No Wound Recurrence: No No No Clustered Wound: [name] [name] [name] Clustered Quantity: 4x2x0.2 4x0.2x0.1 0.2x0.2x0.1 Measurements L x W x D (cm) 6.283 0.628 0.031 [name] (cm) : rea 1.257 0.063 0.003 Volume (cm) : -1.00% 38.50% 0.00% % Reduction in [name] rea: -102.10% 38.20% 0.00% % Reduction in Volume: Full Thickness Without Exposed Full Thickness Without Exposed Full Thickness Without Exposed Classification: Support Structures Support Structures Support Structures Medium Medium Medium Exudate Amount: Serosanguineous Serosanguineous Serosanguineous Exudate Type: red, brown red, brown red, brown Exudate Color: Distinct, outline attached Distinct, outline attached Distinct, outline attached Wound Margin: Medium (34-66%) Medium (34-66%) Large (67-100%) Granulation Amount: Pink, Pale Pink Red Granulation Quality: Medium (34-66%) Medium (34-66%) None Present (0%) Necrotic Amount: Fat Layer (Subcutaneous Tissue): Yes Fat Layer (Subcutaneous Tissue): Yes Fat Layer (Subcutaneous Tissue): Yes Exposed Structures: Fascia: No Fascia: No Fascia: No Tendon: No Tendon: No Tendon: No Muscle: No Muscle: No Muscle: No Joint: No Joint: No Joint: No Bone: No Bone: No Bone: No Small (1-33%) Small (1-33%) None Epithelialization: Treatment Notes Wound #41R (Ischium) Wound Laterality: Left Cleanser Soap and Water Discharge Instruction: May shower and wash wound with dial antibacterial soap and water prior to  dressing change. Peri-Wound Care Ketoconazole Cream 2% Discharge Instruction: Apply Ketoconazole as directed Triad Hydrophilic Wound Dressing Tube, 6 (oz) Discharge Instruction: Apply to periwound with each dressing change Topical Primary Dressing Hydrofera Blue Ready Foam, 2.5 x2.5 in Discharge Instruction: Apply to wound bed as instructed Secondary Dressing ABD Pad, 5x9 Discharge Instruction: Apply over primary dressing as directed. Secured With 3M Medipore H Soft Cloth Surgical T 4 x 2 (in/yd) ape Discharge Instruction: Secure dressing with tape as directed. Compression Wrap Compression Stockings Add-Ons Wound #51R (Lower Leg) Wound Laterality: Left, Anterior Cleanser Soap and Water Discharge Instruction: May shower and wash wound with dial antibacterial soap and water prior to dressing change. Peri-Wound Care Topical Primary Dressing Hydrofera Blue Ready Foam, 2.5 x2.5 in Discharge Instruction: Apply to wound bed as instructed Secondary Dressing Zetuvit Plus Silicone Border Dressing 4x4 (in/in) Discharge Instruction: Apply silicone border  over primary dressing as directed. Secured With 44M Medipore H Soft Cloth Surgical T 4 x 2 (in/yd) ape Discharge Instruction: Secure dressing with tape as directed. Compression Wrap Compression Stockings Add-Ons Wound #52 (Foot) Wound Laterality: Dorsal, Right Cleanser Soap and Water Discharge Instruction: May shower and wash wound with dial antibacterial soap and water prior to dressing change. Peri-Wound Care Topical Primary Dressing Hydrofera Blue Ready Foam, 2.5 x2.5 in Discharge Instruction: Apply to wound bed as instructed Secondary Dressing Woven Gauze Sponge, Non-Sterile 4x4 in Discharge Instruction: Apply over primary dressing as directed. Secured With [employer], 4.5x3.1 (in/yd) Discharge Instruction: Secure with Kerlix as directed. 44M Medipore H Soft Cloth Surgical T ape, 4 x 10 (in/yd) Discharge  Instruction: Secure with tape as directed. Compression Wrap Compression Stockings Add-Ons Wound #56 (Foot) Wound Laterality: Dorsal, Left Cleanser Soap and Water Discharge Instruction: May shower and wash wound with dial antibacterial soap and water prior to dressing change. Peri-Wound Care Topical Mupirocin Ointment Discharge Instruction: Apply Mupirocin (Bactroban) as instructed Primary Dressing Hydrofera Blue Classic Foam, 4x4 in Discharge Instruction: Moisten with saline prior to applying to wound bed Secondary Dressing Woven Gauze Sponge, Non-Sterile 4x4 in Discharge Instruction: Apply over primary dressing as directed. Secured With [employer], 4.5x3.1 (in/yd) Discharge Instruction: Secure with Kerlix as directed. 44M Medipore H Soft Cloth Surgical T ape, 4 x 10 (in/yd) Discharge Instruction: Secure with tape as directed. Compression Wrap Compression Stockings Add-Ons Wound #57 (Foot) Wound Laterality: Plantar, Right Cleanser Soap and Water Discharge Instruction: May shower and wash wound with dial antibacterial soap and water prior to dressing change. Peri-Wound Care Topical Primary Dressing Hydrofera Blue Ready Foam, 2.5 x2.5 in Discharge Instruction: Apply to wound bed as instructed Secondary Dressing Woven Gauze Sponge, Non-Sterile 4x4 in Discharge Instruction: Apply over primary dressing as directed. Secured With [employer], 4.5x3.1 (in/yd) Discharge Instruction: Secure with Kerlix as directed. 44M Medipore H Soft Cloth Surgical T 4 x 2 (in/yd) ape Discharge Instruction: Secure dressing with tape as directed. Compression Wrap Compression Stockings Add-Ons Wound #60 (Lower Leg) Wound Laterality: Right, Lateral Cleanser Soap and Water Discharge Instruction: May shower and wash wound with dial antibacterial soap and water prior to dressing change. Peri-Wound Care Topical Primary Dressing Hydrofera Blue Ready Foam, 2.5 x2.5 in Discharge  Instruction: Apply to wound bed as instructed Secondary Dressing Woven Gauze Sponge, Non-Sterile 4x4 in Discharge Instruction: Apply over primary dressing as directed. Secured With [employer], 4.5x3.1 (in/yd) Discharge Instruction: Secure with Kerlix as directed. 44M Medipore H Soft Cloth Surgical T 4 x 2 (in/yd) ape Discharge Instruction: Secure dressing with tape as directed. Compression Wrap Compression Stockings Add-Ons Electronic Signature(s) Signed: 12/15/2021 4:35:26 PM By: Linton Ham MD Signed: 12/15/2021 5:46:53 PM By: Levan Hurst [name], BSN Entered By: Linton Ham on 12/15/2021 09:23:17 -------------------------------------------------------------------------------- Multi-Disciplinary Care Plan Details Patient Name: Date of Service: [name], [name] 12/15/2021 8:00 [name] Medical Record Number: [id number] Patient Account Number: [id number] Date of Birth/Sex: Treating [name]: 1986-03-20 (36 y.o. [name] Primary Care [name]: [name] Other Clinician: Referring Kataleah Bejar: Treating Senita Corredor/Extender: Malachi Carl [name] in Treatment: [place] reviewed with physician Active Inactive Wound/Skin Impairment Nursing Diagnoses: Impaired tissue integrity Knowledge deficit related to ulceration/compromised skin integrity Goals: Patient/caregiver will verbalize understanding of skin care regimen Date Initiated: 01/05/2016 Target Resolution Date: 01/23/2022 Goal Status: Active Ulcer/skin breakdown will have [name] volume reduction of 30% by week 4 Date Initiated: 01/05/2016 Date Inactivated: 12/22/2017 Target Resolution Date:  01/19/2018 Unmet Reason: complex wounds, Goal Status: Unmet infection Interventions: Assess patient/caregiver ability to obtain necessary supplies Assess ulceration(s) every visit Provide education on ulcer and skin care Notes: 02/02/21: Complex Care, ongoing. Electronic Signature(s) Signed:  12/17/2021 5:37:29 PM By: [name] Entered By: [name] on 12/15/2021 08:27:35 -------------------------------------------------------------------------------- Pain Assessment Details Patient Name: Date of Service: [name], [name] 12/15/2021 8:00 [name] Medical Record Number: [id number] Patient Account Number: [id number] Date of Birth/Sex: Treating [name]: 1986-05-19 (36 y.o. [name] Primary Care Jacquita Mulhearn: [name] Other Clinician: Referring Preet Perrier: Treating Fedrick Cefalu/Extender: Malachi Carl [name] in Treatment: 310 Active Problems Location of Pain Severity and Description of Pain Patient Has Paino No Site Locations Pain Management and Medication Current Pain Management: Electronic Signature(s) Signed: 12/17/2021 5:37:29 PM By: [name] Entered By: [name] on 12/15/2021 07:55:59 -------------------------------------------------------------------------------- Patient/Caregiver Education Details Patient Name: Date of Service: Dlugosz, [name] Viviann Spare 1/24/2023andnbsp8:00 [name] Medical Record Number: [id number] Patient Account Number: [id number] Date of Birth/Gender: Treating [name]: 08-18-1986 (36 y.o. [name] Primary Care Physician: Janine Limbo Other Clinician: Referring Physician: Treating Physician/Extender: Malachi Carl [name] in Treatment: 43 Education Assessment Education Provided To: Patient Education Topics Provided Wound/Skin Impairment: Methods: Explain/Verbal Responses: Reinforcements needed, State content correctly Electronic Signature(s) Signed: 12/17/2021 5:37:29 PM By: [name] Entered By: [name] on 12/15/2021 08:27:58 -------------------------------------------------------------------------------- Wound Assessment Details Patient Name: Date of Service: [name], [name] 12/15/2021 8:00 [name] Medical Record Number: [id number] Patient Account Number:  [id number] Date of Birth/Sex: Treating [name]: 01-31-1986 (36 y.o. [name] Primary Care Madalene Mickler: [name] Other Clinician: Referring Joanathan Affeldt: Treating Leotis Isham/Extender: Malachi Carl [name] in Treatment: 310 Wound Status Wound Number: [id number] Primary Etiology: Pressure Ulcer Wound Location: Left Ischium Wound Status: Open Wounding Event: Gradually Appeared Comorbid History: Sleep Apnea, Hypertension, Paraplegia Date Acquired: 03/16/2020 [name] Of Treatment: 91 Clustered Wound: Yes Photos Wound Measurements Length: (cm) 7 Width: (cm) 4 Depth: (cm) 0.1 Clustered Quantity: 3 Area: (cm) 21.991 Volume: (cm) 2.199 % Reduction in Area: -1117.7% % Reduction in Volume: -1114.9% Epithelialization: None Tunneling: No Undermining: No Wound Description Classification: Category/Stage II Wound Margin: Distinct, outline attached Exudate Amount: Medium Exudate Type: Sanguinous Exudate Color: red Foul Odor After Cleansing: No Slough/Fibrino No Wound Bed Granulation Amount: Large (67-100%) Exposed Structure Granulation Quality: Red, Friable Fascia Exposed: No Necrotic Amount: None Present (0%) Fat Layer (Subcutaneous Tissue) Exposed: Yes Tendon Exposed: No Muscle Exposed: No Joint Exposed: No Bone Exposed: No Treatment Notes Wound #41R (Ischium) Wound Laterality: Left Cleanser Soap and Water Discharge Instruction: May shower and wash wound with dial antibacterial soap and water prior to dressing change. Peri-Wound Care Ketoconazole Cream 2% Discharge Instruction: Apply Ketoconazole as directed Triad Hydrophilic Wound Dressing Tube, 6 (oz) Discharge Instruction: Apply to periwound with each dressing change Topical Primary Dressing Hydrofera Blue Ready Foam, 2.5 x2.5 in Discharge Instruction: Apply to wound bed as instructed Secondary Dressing ABD Pad, 5x9 Discharge Instruction: Apply over primary dressing as directed. Secured With 19M Medipore  H Soft Cloth Surgical T 4 x 2 (in/yd) ape Discharge Instruction: Secure dressing with tape as directed. Compression Wrap Compression Stockings Add-Ons Electronic Signature(s) Signed: 12/17/2021 5:37:29 PM By: [name] Entered By: [name] on 12/15/2021 08:22:57 -------------------------------------------------------------------------------- Wound Assessment Details Patient Name: Date of Service: [name], [name] 12/15/2021 8:00 [name] Medical Record Number: [id number] Patient Account Number: [id number] Date of Birth/Sex: Treating [name]: 11-19-1986 (36 y.o. [name] Primary Care Daizee [name]:  [name] Other Clinician: Referring Royalty Domagala: Treating Sephiroth [name]/Extender: Malachi Carl [name] in Treatment: 310 Wound Status Wound Number: [id number] Primary Etiology: Trauma, Other Wound Location: Left, Anterior Lower Leg Wound Status: Open Wounding Event: Trauma Comorbid History: Sleep Apnea, Hypertension, Paraplegia Date Acquired: 01/26/2021 [name] Of Treatment: 43 Clustered Wound: No Photos Wound Measurements Length: (cm) 0.2 Width: (cm) 0.2 Depth: (cm) 0.1 Area: (cm) 0.031 Volume: (cm) 0.003 % Reduction in Area: 90.6% % Reduction in Volume: 90.9% Epithelialization: None Tunneling: No Undermining: No Wound Description Classification: Full Thickness Without Exposed Support Structures Wound Margin: Flat and Intact Exudate Amount: Medium Exudate Type: Serosanguineous Exudate Color: red, brown Foul Odor After Cleansing: No Slough/Fibrino No Wound Bed Granulation Amount: Large (67-100%) Exposed Structure Granulation Quality: Red Fascia Exposed: No Necrotic Amount: None Present (0%) Fat Layer (Subcutaneous Tissue) Exposed: Yes Tendon Exposed: No Muscle Exposed: No Joint Exposed: No Bone Exposed: No Treatment Notes Wound #51R (Lower Leg) Wound Laterality: Left, Anterior Cleanser Soap and Water Discharge Instruction: May shower and  wash wound with dial antibacterial soap and water prior to dressing change. Peri-Wound Care Topical Primary Dressing Hydrofera Blue Ready Foam, 2.5 x2.5 in Discharge Instruction: Apply to wound bed as instructed Secondary Dressing Zetuvit Plus Silicone Border Dressing 4x4 (in/in) Discharge Instruction: Apply silicone border over primary dressing as directed. Secured With 2M Medipore H Soft Cloth Surgical T 4 x 2 (in/yd) ape Discharge Instruction: Secure dressing with tape as directed. Compression Wrap Compression Stockings Add-Ons Electronic Signature(s) Signed: 12/17/2021 5:37:29 PM By: [name] Entered By: [name] on 12/15/2021 08:16:21 -------------------------------------------------------------------------------- Wound Assessment Details Patient Name: Date of Service: [name], [name] 12/15/2021 8:00 [name] Medical Record Number: [id number] Patient Account Number: [id number] Date of Birth/Sex: Treating [name]: [birth year] (36 y.o. [name] Primary Care Wilbert Hayashi: [name] Other Clinician: Referring Aldonia Keeven: Treating Lorilyn Laitinen/Extender: Malachi Carl [name] in Treatment: 310 Wound Status Wound Number: 52 Primary Etiology: Trauma, Other Wound Location: Right, Dorsal Foot Wound Status: Open Wounding Event: Gradually Appeared Comorbid History: Sleep Apnea, Hypertension, Paraplegia Date Acquired: 03/27/2021 [name] Of Treatment: 37 Clustered Wound: No Photos Wound Measurements Length: (cm) 2 Width: (cm) 4 Depth: (cm) 0.1 Area: (cm) 6.283 Volume: (cm) 0.628 % Reduction in Area: -233.3% % Reduction in Volume: -234% Epithelialization: None Tunneling: No Undermining: No Wound Description Classification: Full Thickness Without Exposed Support Structures Wound Margin: Distinct, outline attached Exudate Amount: Medium Exudate Type: Serosanguineous Exudate Color: red, brown Foul Odor After Cleansing: No Slough/Fibrino  Yes Wound Bed Granulation Amount: Small (1-33%) Exposed Structure Granulation Quality: Pink, Pale Fascia Exposed: No Necrotic Amount: Large (67-100%) Fat Layer (Subcutaneous Tissue) Exposed: Yes Necrotic Quality: Adherent Slough Tendon Exposed: No Muscle Exposed: No Joint Exposed: No Bone Exposed: No Treatment Notes Wound #52 (Foot) Wound Laterality: Dorsal, Right Cleanser Soap and Water Discharge Instruction: May shower and wash wound with dial antibacterial soap and water prior to dressing change. Peri-Wound Care Topical Primary Dressing Hydrofera Blue Ready Foam, 2.5 x2.5 in Discharge Instruction: Apply to wound bed as instructed Secondary Dressing Woven Gauze Sponge, Non-Sterile 4x4 in Discharge Instruction: Apply over primary dressing as directed. Secured With [employer], 4.5x3.1 (in/yd) Discharge Instruction: Secure with Kerlix as directed. 2M Medipore H Soft Cloth Surgical T ape, 4 x 10 (in/yd) Discharge Instruction: Secure with tape as directed. Compression Wrap Compression Stockings Add-Ons Electronic Signature(s) Signed: 12/17/2021 5:37:29 PM By: [name] Entered By: [name] on 12/15/2021 08:17:15 -------------------------------------------------------------------------------- Wound Assessment Details Patient Name: Date of Service: [name],  [name] 12/15/2021 8:00 [name] Medical Record Number: [id number] Patient Account Number: [id number] Date of Birth/Sex: Treating [name]: 26-Mar-1986 (36 y.o. [name] Primary Care Meyer Arora: [name] Other Clinician: Referring Nikodem Leadbetter: Treating Gregorey Nabor/Extender: Malachi Carl [name] in Treatment: 310 Wound Status Wound Number: 56 Primary Etiology: Neuropathic Ulcer-Non Diabetic Wound Location: Left, Dorsal Foot Wound Status: Open Wounding Event: Gradually Appeared Comorbid History: Sleep Apnea, Hypertension, Paraplegia Date Acquired: 07/11/2021 [name] Of Treatment:  21 Clustered Wound: No Photos Wound Measurements Length: (cm) 4 Width: (cm) 2 Depth: (cm) 0.2 Area: (cm) 6.283 Volume: (cm) 1.257 % Reduction in Area: -1% % Reduction in Volume: -102.1% Epithelialization: Small (1-33%) Tunneling: No Undermining: No Wound Description Classification: Full Thickness Without Exposed Support Structures Wound Margin: Distinct, outline attached Exudate Amount: Medium Exudate Type: Serosanguineous Exudate Color: red, brown Foul Odor After Cleansing: No Slough/Fibrino Yes Wound Bed Granulation Amount: Medium (34-66%) Exposed Structure Granulation Quality: Pink, Pale Fascia Exposed: No Necrotic Amount: Medium (34-66%) Fat Layer (Subcutaneous Tissue) Exposed: Yes Necrotic Quality: Adherent Slough Tendon Exposed: No Muscle Exposed: No Joint Exposed: No Bone Exposed: No Treatment Notes Wound #56 (Foot) Wound Laterality: Dorsal, Left Cleanser Soap and Water Discharge Instruction: May shower and wash wound with dial antibacterial soap and water prior to dressing change. Peri-Wound Care Topical Mupirocin Ointment Discharge Instruction: Apply Mupirocin (Bactroban) as instructed Primary Dressing Hydrofera Blue Classic Foam, 4x4 in Discharge Instruction: Moisten with saline prior to applying to wound bed Secondary Dressing Woven Gauze Sponge, Non-Sterile 4x4 in Discharge Instruction: Apply over primary dressing as directed. Secured With [employer], 4.5x3.1 (in/yd) Discharge Instruction: Secure with Kerlix as directed. 24M Medipore H Soft Cloth Surgical T ape, 4 x 10 (in/yd) Discharge Instruction: Secure with tape as directed. Compression Wrap Compression Stockings Add-Ons Electronic Signature(s) Signed: 12/17/2021 5:37:29 PM By: [name] Entered By: [name] on 12/15/2021 08:19:03 -------------------------------------------------------------------------------- Wound Assessment Details Patient Name: Date of  Service: [place], [name] 12/15/2021 8:00 [name] Medical Record Number: [id number] Patient Account Number: [id number] Date of Birth/Sex: Treating [name]: 11-12-1986 (36 y.o. [name] Primary Care Refugia Laneve: [name] Other Clinician: Referring Bryam Taborda: Treating Chance Karam/Extender: Malachi Carl [name] in Treatment: 310 Wound Status Wound Number: 57 Primary Etiology: Neuropathic Ulcer-Non Diabetic Wound Location: Right, Plantar Foot Wound Status: Open Wounding Event: Gradually Appeared Comorbid History: Sleep Apnea, Hypertension, Paraplegia Date Acquired: 08/25/2021 [name] Of Treatment: 16 Clustered Wound: No Wound Measurements Length: (cm) 4 Width: (cm) 0.2 Depth: (cm) 0.1 Area: (cm) 0.628 Volume: (cm) 0.063 % Reduction in Area: 38.5% % Reduction in Volume: 38.2% Epithelialization: Small (1-33%) Tunneling: No Undermining: No Wound Description Classification: Full Thickness Without Exposed Support Structures Wound Margin: Distinct, outline attached Exudate Amount: Medium Exudate Type: Serosanguineous Exudate Color: red, brown Foul Odor After Cleansing: No Slough/Fibrino Yes Wound Bed Granulation Amount: Medium (34-66%) Exposed Structure Granulation Quality: Pink Fascia Exposed: No Necrotic Amount: Medium (34-66%) Fat Layer (Subcutaneous Tissue) Exposed: Yes Necrotic Quality: Adherent Slough Tendon Exposed: No Muscle Exposed: No Joint Exposed: No Bone Exposed: No Treatment Notes Wound #57 (Foot) Wound Laterality: Plantar, Right Cleanser Soap and Water Discharge Instruction: May shower and wash wound with dial antibacterial soap and water prior to dressing change. Peri-Wound Care Topical Primary Dressing Hydrofera Blue Ready Foam, 2.5 x2.5 in Discharge Instruction: Apply to wound bed as instructed Secondary Dressing Woven Gauze Sponge, Non-Sterile 4x4 in Discharge Instruction: Apply over primary dressing as directed. Secured  With [employer], 4.5x3.1 (in/yd) Discharge Instruction: Secure with  Kerlix as directed. 91M Medipore H Soft Cloth Surgical T 4 x 2 (in/yd) ape Discharge Instruction: Secure dressing with tape as directed. Compression Wrap Compression Stockings Add-Ons Electronic Signature(s) Signed: 12/17/2021 5:37:29 PM By: [name] Entered By: [name] on 12/15/2021 08:13:34 -------------------------------------------------------------------------------- Wound Assessment Details Patient Name: Date of Service: [place], [name] 12/15/2021 8:00 [name] Medical Record Number: [id number] Patient Account Number: [id number] Date of Birth/Sex: Treating [name]: Nov 10, 1986 (36 y.o. [name] Primary Care Nayra Coury: [name] Other Clinician: Referring Loreto Loescher: Treating Khala Tarte/Extender: Malachi Carl [name] in Treatment: 310 Wound Status Wound Number: 60 Primary Etiology: T be determined o Wound Location: Right, Lateral Lower Leg Wound Status: Open Wounding Event: Gradually Appeared Comorbid History: Sleep Apnea, Hypertension, Paraplegia Date Acquired: 12/08/2021 [name] Of Treatment: 1 Clustered Wound: No Photos Wound Measurements Length: (cm) 0.2 Width: (cm) 0.2 Depth: (cm) 0.1 Area: (cm) 0.031 Volume: (cm) 0.003 % Reduction in Area: 0% % Reduction in Volume: 0% Epithelialization: None Tunneling: No Undermining: No Wound Description Classification: Full Thickness Without Exposed Support Structures Wound Margin: Distinct, outline attached Exudate Amount: Medium Exudate Type: Serosanguineous Exudate Color: red, brown Foul Odor After Cleansing: No Slough/Fibrino No Wound Bed Granulation Amount: Large (67-100%) Exposed Structure Granulation Quality: Red Fascia Exposed: No Necrotic Amount: None Present (0%) Fat Layer (Subcutaneous Tissue) Exposed: Yes Tendon Exposed: No Muscle Exposed: No Joint Exposed: No Bone Exposed:  No Treatment Notes Wound #60 (Lower Leg) Wound Laterality: Right, Lateral Cleanser Soap and Water Discharge Instruction: May shower and wash wound with dial antibacterial soap and water prior to dressing change. Peri-Wound Care Topical Primary Dressing Hydrofera Blue Ready Foam, 2.5 x2.5 in Discharge Instruction: Apply to wound bed as instructed Secondary Dressing Woven Gauze Sponge, Non-Sterile 4x4 in Discharge Instruction: Apply over primary dressing as directed. Secured With [employer], 4.5x3.1 (in/yd) Discharge Instruction: Secure with Kerlix as directed. 91M Medipore H Soft Cloth Surgical T 4 x 2 (in/yd) ape Discharge Instruction: Secure dressing with tape as directed. Compression Wrap Compression Stockings Add-Ons Electronic Signature(s) Signed: 12/17/2021 5:37:29 PM By: [name] Entered By: [name] on 12/15/2021 08:19:54 -------------------------------------------------------------------------------- Vitals Details Patient Name: Date of Service: Spieth, [name] 12/15/2021 8:00 [name] Medical Record Number: [id number] Patient Account Number: [id number] Date of Birth/Sex: Treating [name]: 06/14/1986 (36 y.o. [name] Primary Care Nahom Carfagno: [place], [place] Other Clinician: Referring Caidyn Blossom: Treating Tareka Jhaveri/Extender: Malachi Carl [name] in Treatment: 310 Vital Signs Time Taken: 07:55 Temperature (F): 98.1 Height (in): 70 Pulse (bpm): 120 Weight (lbs): 216 Respiratory Rate (breaths/min): 17 Body Mass Index (BMI): 31 Blood Pressure (mmHg): 129/65 Reference Range: 80 - 120 mg / dl Electronic Signature(s) Signed: 12/17/2021 5:37:29 PM By: [name] Entered By: [name] on 12/15/2021 08:01:32

## 2021-12-17 NOTE — Progress Notes (Signed)
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 aggressively for this although we have not made any improvement here. 08/11/17; left lateral leg. Surface continues to improve with the Iodoflex although we are not seeing much improvement in overall wound dimensions. Areas on his plantar left foot and right foot show no improvement. In fact the right foot looks somewhat worse 08/18/17; left lateral leg. We changed to [facility] Blue last week after a prolonged course of Iodoflex which helps get the surface better. It appears that the wound with is improved. Continue with difficult areas on the left dorsal first second and plantar first second on the right  09/01/17; patient arrives in clinic today having had a temperature of 103 yesterday. He was seen in the ER and [facility] The patient was concerned he could have cellulitis again in the right leg however they diagnosed him with a UTI and he is now on Keflex. He has a history of cellulitis which is been recurrent and difficult but this is been in the left leg, in the past 5 use doxycycline. He does in and out catheterizations at home which are risk factors for UTI 09/08/17; patient will be completing his Keflex this weekend. The erythema on the left leg is considerably better. He has a new  wound today on the medial part of the right leg small superficial almost looks like a skin tear. He has worsening of the area on the right dorsal first and second toe. His major area on the left lateral leg is better. Using Hydrofera Blue on all areas 09/15/17; gradual reduction in width on the long wound in the left lateral leg. No debridement required. He also has wounds on the plantar aspect of his left first second toe web space and on the dorsal aspect of the right first second toe web space. 09/22/17; there continues to be very gradual improvements in the dimensions of the left lateral leg wound. He hasn't round erythematous spot with might be pressure on his wheelchair. There is no evidence obviously of infection no purulence no warmth He has a dry scaled area on the plantar aspect of the left first second toe Improved area on the dorsal right first second toe. 09/29/17; left lateral leg wound continues to improve in dimensions mostly with an is still a fairly long but increasingly narrow wound. He has a dry scaled area on the plantar aspect of his left first second toe web space Increasingly concerning area on the dorsal right first second toe. In fact I am concerned today about possible cellulitis around this wound. The areas extending up his second toe and although there is deformities here almost appears to abut on the nailbed. 10/06/17; left lateral leg wound continues to make very gradual progress. Tissue culture I did from the right first second toe dorsal foot last time grew MRSA and enterococcus which was vancomycin sensitive. This was not sensitive to clindamycin or doxycycline. He is allergic to Zyvox and sulfa we have therefore arrange for him to have dalvance infusion tomorrow. He is had this in the past and tolerated it well 10/20/17; left lateral leg wound continues to make decent progress. This is certainly reduced in terms of with there is advancing epithelialization.The  cellulitis in the right foot looks better although he still has a deep wound in the dorsal aspect of the first second toe web space. Plantar left first toe web space on the left I think is making some progress 10/27/17; left lateral leg wound continues to make decent progress. Advancing epithelialization.using Hydrofera Blue The right first second toe web space wound is better-looking using silver alginate Improvement in the left plantar first second toe web space. Again using silver alginate 11/03/17 left lateral leg wound continues to make decent progress albeit slowly. Using [facility] The right per second toe web space continues to be a very problematic looking punched out wound. I obtained a piece of tissue for deep culture I did extensively treated this for fungus. It is difficult to imagine that this is a pressure area as the patient states other than going outside he doesn't really wear shoes at home The  left plantar first second toe web space looked fairly senescent. Necrotic edges. This required debridement change to [facility] Blue to all wound areas 11/10/17; left lateral leg wound continues to contract. Using Hydrofera Blue On the right dorsal first second toe web space dorsally. Culture I did of this area last week grew MRSA there is not an easy oral option in this patient was multiple antibiotic allergies or intolerances. This was only a rare culture isolate I'm therefore going to use Bactroban under silver alginate On the left plantar first second toe web space. Debridement is required here. This is also unchanged 11/17/17; left lateral leg wound continues to contract using Hydrofera Blue this is no longer the major issue. The major concern here is the right first second toe web space. He now has an open area going from dorsally to the plantar aspect. There is now wound on the inner lateral part of the first toe. Not a very viable surface on this. There is erythema spreading  medially into the forefoot. No major change in the left first second toe plantar wound 11/24/17; left lateral leg wound continues to contract using Hydrofera Blue. Nice improvement today The right first second toe web space all of this looks a lot less angry than last week. I have given him clindamycin and topical Bactroban for MRSA and terbinafine for the possibility of underlining tinea pedis that I could not control with ketoconazole. Looks somewhat better The area on the plantar left first second toe web space is weeping with dried debris around the wound 12/01/17; left lateral leg wound continues to contract he Hydrofera Blue. It is becoming thinner in terms of with nevertheless it is making good improvement. The right first second toe web space looks less angry but still a large necrotic-looking wounds starting on the plantar aspect of the right foot extending between the toes and now extensively on the base of the right second toe. I gave him clindamycin and topical Bactroban for MRSA anterior benefiting for the possibility of underlying tinea pedis. Not looking better today The area on the left first/second toe looks better. Debrided of necrotic debris 12/05/17* the patient was worked in urgently today because over the weekend he found blood on his incontinence bad when he woke up. He was found to have an ulcer by his wife who does most of his wound care. He came in today for [place] to look at this. He has not had a history of wounds in his buttocks in spite of his paraplegia. 12/08/17; seen in follow-up today at his usual appointment. He was seen earlier this week and found to have a new wound on his buttock. We also follow him for wounds on the left lateral leg, left first second toe web space and right first second toe web space 12/15/17; we have been using Hydrofera Blue to the left lateral leg which has improved. The right first second toe web space has also improved. Left first second toe web  space plantar aspect looks stable. The left buttock has worsened using Santyl. Apparently the buttock has drainage 12/22/17; we have been using Hydrofera Blue to the left lateral leg which continues to improve now 2 small wounds separated by normal skin. He tells [place] he had a fever up to 100 yesterday he is prone to UTIs but has not noted anything different. He does in and out catheterizations. The area between the first and second toes today does not look good necrotic surface covered with what looks  to be purulent drainage and erythema extending into the third toe. I had gotten this to something that I thought look better last time however it is not look good today. He also has a necrotic surface over the buttock wound which is expanded. I thought there might be infection under here so I removed a lot of the surface with a #5 curet though nothing look like it really needed culturing. He is been using Santyl to this area 12/27/17; his original wound on the left lateral leg continues to improve using Hydrofera Blue. I gave him samples of Baxdella although he was unable to take them out of fear for an allergic reaction ["lump in his throat"].the culture I did of the purulent drainage from his second toe last week showed both enterococcus and a set Enterobacter I was also concerned about the erythema on the bottom of his foot although paradoxically although this looks somewhat better today. 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 We've been using silver alginate here. They came up today that he sleeps in a recliner, I'm not sure why but I asked him to stop this 01/03/18; his original wound we've been using Hydrofera Blue is now separated into 2 areas. Ulcer on his left buttock is better he is off the recliner and sleeping in bed Finally both wound areas between his first and second toes also looks some  better 01/10/18; his original wound on the left lateral leg is now separated into 2 wounds we've been using Hydrofera Blue Ulcer on his left buttock has some drainage. There is a small probing site going into muscle layer superiorly.using silver alginate -He arrives today with a deep tissue injury on the left heel The wound on the dorsal aspect of his first second toe on the left looks a lot betterusing silver alginate ketoconazole The area on the first second toe web space on the right also looks a lot bette 01/17/18; his original wound on the left lateral leg continues to progress using Hydrofera Blue Ulcer on his left buttock also is smaller surface healthier except for a small probing site going into the muscle layer superiorly. 2.4 cm of tunneling in this area DTI on his left heel we have only been offloading. Looks better than last week no threatened open no evidence of infection the wound on the dorsal aspect of the first second toe on the left continues to look like it's regressing we have only been using silver alginate and terbinafine orally The area in the first second toe web space on the right also looks to be a lot better using silver alginate and terbinafine I think this was prompted by tinea pedis 01/31/18; the patient was hospitalized in [place] last week apparently for a complicated UTI. He was discharged on cefepime he does in and out catheterizations. In the hospital he was discovered M I don't mild elevation of AST and ALT and the terbinafine was stopped.predictably the pressure ulcer on s his buttock looks betterusing silver alginate. The area on the left lateral leg also is better using Hydrofera Blue. The area between the first and second toes on the left better. First and second toes on the right still substantial but better. Finally the DTI on the left heel has held together and looks like it's resolving 02/07/18-he is here in follow-up evaluation for multiple ulcerations.  He has new injury to the lateral aspect of  the last issue a pressure ulcer, he states this is from adhesive removal trauma. He states he has tried multiple adhesive products with no success. All other ulcers appear stable. The left heel DTI is resolving. We will continue with same treatment plan and follow-up next week. 02/14/18; follow-up for multiple areas. He has a new area last week on the lateral aspect of his pressure ulcer more over the posterior trochanter. The original pressure ulcer looks quite stable has healthy granulation. 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 Almost fully epithelialized on the original superior area using Hydrofera Blue DTI on the left heel has peeled off this week to reveal a small superficial wound under denuded skin and subcutaneous tissue Both areas between the first and second toes look better including nothing open on the left 02/21/18; The patient's wounds on his left ischial tuberosity and posterior left greater trochanter actually looked better. He has a large area of irritation around the area which I think is contact dermatitis. I am doubtful that this is fungal His original wound on the left lateral calf continues to improve we have been using Hydrofera Blue There is no open area in the left first second toe web space although there is a lot of thick callus The DTI on the left heel required debridement today of necrotic surface eschar and subcutaneous tissue using silver alginate Finally the area on the right first second toe webspace continues to contract using silver alginate and ketoconazole 02/28/18 Left ischial tuberosity wounds look better using silver alginate. Original wound on the left calf only has one small open area left using Hydrofera Blue DTI on the left heel required debridement mostly removing skin from around this wound surface. Using  silver alginate The areas on the right first/second toe web space using silver alginate and ketoconazole 03/08/18 on evaluation today patient appears to be doing decently well as best I can tell in regard to his wounds. This is the first time that I have seen him as he generally is followed by Dr. Dellia Nims. With that being said none of his wounds appear to be infected he does have an area where there is some skin covering what appears to be a new wound on the left dorsal surface of his great toe. This is right at the nail bed. With that being said I do believe that debrided away some of the excess skin can be of benefit in this regard. Otherwise he has been tolerating the dressing changes without complication. 03/14/18; patient arrives today with the multiplicity of wounds that we are following. He has not been systemically unwell Original wound on the left lateral calf now only has 2 small open areas we've been using Hydrofera Blue which should continue The deep tissue injury on the left heel requires debridement today. We've been using silver alginate The left first second toe and the right first second toe are both are reminiscence what I think was tinea pedis. Apparently some of the callus Surface between the toes was removed last week when it started draining. Purulent drainage coming from the wound on the ischial tuberosity on the left. 03/21/18-He is here in follow-up evaluation for multiple wounds. 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 He tolerated debridement. The only change to last week's recommendations is to discontinue antifungal cream between toes. He will follow-up next week  03/28/18; following up for multiple wounds;Concern this week is streaking redness and swelling in the right foot. He is going to need antibiotics for this. 03/31/18; follow-up for right foot cellulitis. Streaking redness and swelling in the  right foot on 03/28/18. 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 I put him on clindamycin 300 mg every 6 and brought him in for a quick check. He has an open wound between his first and second toes on the right foot as a potential source. 04/04/18; Right foot cellulitis is resolving he is completing clindamycin. This is truly good news Left lateral calf wound which is initial wound only has one small open area inferiorly this is close to healing out. He has compression stockings. We will use Hydrofera Blue right down to the epithelialization of this Nonviable surface on the left heel which was initially pressure with a DTI. We've been using Hydrofera Blue. I'm going to switch this back to silver alginate Left first second toe/tinea pedis this looks better using silver alginate Right first second toe tinea pedis using silver alginate Large pressure ulcers on theLeft ischial tuberosity. Small wound here Looks better. I am uncertain about the surface over the large wound. Using silver alginate 04/11/18; Cellulitis in the right foot is resolved Left lateral calf wound which was his original wounds still has 2 tiny open areas remaining this is just about closed Nonviable surface on the left heel is better but still requires debridement Left first second toe/tinea pedis still open using silver alginate Right first second toe wound tinea pedis I asked him to go back to using ketoconazole and silver alginate Large pressure ulcers on the left ischial tuberosity this shear injury here is resolved. Wound is smaller. No evidence of infection using silver alginate 04/18/18; Patient arrives with an intense area of cellulitis in the right mid lower calf extending into the right heel area. Bright red and warm. Smaller area on the left anterior leg. He has a significant history of MRSA. He will definitely need antibioticsdoxycycline He now has 2 open areas on the left ischial tuberosity the  original large wound and now a satellite area which I think was above his initial satellite areas. Not a wonderful surface on this satellite area surrounding erythema which looks like pressure related. His left lateral calf wound again his original wound is just about closed Left heel pressure injury still requiring debridement Left first second toe looks a lot better using silver alginate Right first second toe also using silver alginate and ketoconazole cream also looks better 04/20/18; the patient was worked in early today out of concerns with his cellulitis on the right leg. I had started him on doxycycline. This was 2 days ago. His wife was concerned about the swelling in the area. Also concerned about the left buttock. He has not been systemically unwell no fever chills. No nausea vomiting or diarrhea 04/25/18; the patient's left buttock wound is continued to deteriorate he is using Hydrofera Blue. He is still completing clindamycin for the cellulitis on the right leg although all of this looks better. 05/02/18 Left buttock wound still with a lot of drainage and a very tightly adherent fibrinous necrotic surface. He has a deeper area superiorly The left lateral calf wound is still closed DTI wound on the left heel necrotic surface especially the circumference using Iodoflex Areas between his left first second toe and right first second toe both look better. Dorsally and the right first second toe he  had a necrotic surface although at smaller. In using silver alginate and ketoconazole. I did a culture last week which was a deep tissue culture of the reminiscence of the open wound on the right first second toe dorsally. This grew a few Acinetobacter and a few methicillin-resistant staph aureus. Nevertheless the area actually this week looked better. I didn't feel the need to specifically address this at least in terms of systemic antibiotics. 05/09/18; wounds are measuring larger more drainage per  our intake. We are using Santyl covered with alginate on the large superficial buttock wounds, Iodosorb on the left heel, ketoconazole and silver alginate to the dorsal first and second toes bilaterally. 05/16/18; The area on his left buttock better in some aspects although the area superiorly over the ischial tuberosity required an extensive debridement.using Santyl Left heel appears stable. Using Iodoflex The areas between his first and second toes are not bad however there is spreading erythema up the dorsal aspect of his left foot this looks like cellulitis again. He is insensate the erythema is really very brilliant.o Erysipelas He went to see an allergist days ago because he was itching part of this he had lab work done. This showed a white count of 15.1 with 70% neutrophils. Hemoglobin of 11.4 and a platelet count of 659,000. Last white count we had in Epic was a 2-1/2 years ago which was 25.9 but he was ill at the time. He was able to show me some lab work that was done by his primary physician the pattern is about the same. 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 But prompted me to go ahead and do x-rays of both feet and the pelvis rule out osteomyelitis. He also had a comprehensive metabolic panel this was reasonably normal his albumin was 3.7 liver function tests BUN/creatinine all normal 05/23/18; x-rays of both his feet from last week were negative for underlying pulmonary abnormality. The x-ray of his pelvis however showed mild irregularity in the left ischial which may represent some early osteomyelitis. The wound in the left ischial continues to get deeper clearly now exposed muscle. Each week necrotic surface material over this area. Whereas the rest of the wounds do not look so bad. The left ischial wound we have been using Santyl and calcium alginate T the left heel surface necrotic debris using Iodoflex o The left lateral leg is still  healed Areas on the left dorsal foot and the right dorsal foot are about the same. There is some inflammation on the left which might represent contact dermatitis, fungal dermatitis I am doubtful cellulitis although this looks better than last week 05/30/18; CT scan done at Hospital did not show any osteomyelitis or abscess. Suggested the possibility of underlying cellulitis although I don't see a lot of evidence of this at the bedside The wound itself on the left buttock/upper thigh actually looks somewhat better. No debridement Left heel also looks better no debridement continue Iodoflex Both dorsal first second toe spaces appear better using Lotrisone. Left still required debridement 06/06/18; Intake reported some purulent looking drainage from the left gluteal wound. Using Santyl and calcium alginate Left heel looks better although still a nonviable surface requiring debridement The left dorsal foot first/second webspace actually expanding and somewhat deeper. I may consider doing a shave biopsy of this area Right dorsal foot first/second webspace appears stable to improved. Using Lotrisone and silver alginate to both these areas 06/13/18 Left gluteal surface looks  better. Now separated in the 2 wounds. No debridement required. Still drainage. We'll continue silver alginate Left heel continues to look better with Iodoflex continue this for at least another week Of his dorsal foot wounds the area on the left still has some depth although it looks better than last week. We've been using Lotrisone and silver alginate 06/20/18 Left gluteal continues to look better healthy tissue Left heel continues to look better healthy granulation wound is smaller. He is using Iodoflex and his long as this continues continue the Iodoflex Dorsal right foot looks better unfortunately dorsal left foot does not. There is swelling and erythema of his forefoot. He had minor trauma to this several days ago but doesn't  think this was enough to have caused any tissue injury. Foot looks like cellulitis, we have had this problem before 06/27/18 on evaluation today patient appears to be doing a little worse in regard to his foot ulcer. 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 Both of which would really be his only options for treating this infection. In the past he has been given and effusion of Orbactiv. This is done very well for him in the past again it's one time dosing IV antibiotic therapy. Subsequently I do believe this is something we're gonna need to see about doing at this point in time. Currently his other wounds seem to be doing somewhat better in my pinion I'm pretty happy in that regard. 07/03/18 on evaluation today patient's wounds actually appear to be doing fairly well. He has been tolerating the dressing changes without complication. All in all he seems to be showing signs of improvement. In regard to the antibiotics he has been dealing with infectious disease since I saw him last week as far as getting this scheduled. In the end [facility]'s [facility] this coming Friday. In the meantime he has been continuing to perform the dressing changes in such as previous. There does not appear to be any evidence of infection worsengin at this time. 07/10/18; Since I last saw this man 2 weeks ago things have actually improved. IV antibiotics of resulted in less forefoot erythema although there is still some present. He is not systemically unwell Left buttock wounds 2 now have no depth there is increased epithelialization Using silver alginate Left heel still requires debridement using Iodoflex Left dorsal foot still with a sizable wound about the size of a border but healthy granulation Right dorsal foot still with a  slitlike area using silver alginate 07/18/18; the patient's cellulitis in the left foot is improved in fact I think it is on its way to resolving. Left buttock wounds 2 both look better although the larger one has hypertension granulation we've been using silver alginate Left heel has some thick circumferential redundant skin over the wound edge which will need to be removed today we've been using Iodoflex Left dorsal foot is still a sizable wound required debridement using silver alginate The right dorsal foot is just about closed only a small open area remains here 07/25/18; left foot cellulitis is resolved Left buttock wounds 2 both look better. Hyper-granulation on the major area Left heel as some debris over the surface but otherwise looks a healthier wound. Using silver collagen Right dorsal foot is just about closed 07/31/18; arrives with  our intake nurse worried about purulent drainage from the buttock. We had hyper-granulation here last week His buttock wounds 2 continue to look better Left heel some debris over the surface but measuring smaller. Right dorsal foot unfortunately has openings between the toes Left foot superficial wound looks less aggravated. 08/07/18 Buttock wounds continue to look better although some of her granulation and the larger medial wound. silver alginate Left heel continues to look a lot better.silver collagen Left foot superficial wound looks less stable. Requires debridement. He has a new wound superficial area on the foot on the lateral dorsal foot. Right foot looks better using silver alginate without Lotrisone 08/14/2018; patient was in the ER last week diagnosed with a UTI. He is now on Cefpodoxime and Macrodantin. Buttock wounds continued to be smaller. Using silver alginate Left heel continues to look better using silver collagen Left foot superficial wound looks as though it is improving Right dorsal foot area is just about healed. 08/21/2018; patient is  completed his antibiotics for his UTI. He has 2 open areas on the buttocks. There is still not closed although the surface looks satisfactory. Using silver alginate Left heel continues to improve using silver collagen The bilateral dorsal foot areas which are at the base of his first and second toes/possible tinea pedis are actually stable on the left but worse on the right. The area on the left required debridement of necrotic surface. After debridement I obtained a specimen for PCR culture. The right dorsal foot which is been just about healed last week is now reopened 08/28/2018; culture done on the left dorsal foot showed coag negative staph both staph epidermidis and Lugdunensis. I think this is worthwhile initiating systemic treatment. I will use doxycycline given his long list of allergies. The area on the left heel slightly improved but still requiring debridement. The large wound on the buttock is just about closed whereas the smaller one is larger. Using silver alginate in this area 09/04/2018; patient is completing his doxycycline for the left foot although this continues to be a very difficult wound area with very adherent necrotic debris. We are using silver alginate to all his wounds right foot left foot and the small wounds on his buttock, silver collagen on the left heel. 09/11/2018; once again this patient has intense erythema and swelling of the left forefoot. Lesser degrees of erythema in the right foot. He has a long list of allergies and intolerances. I will reinstitute doxycycline. 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 Using silver alginate Left heel also looks better using silver collagen Unfortunately both the areas on his feet look worse. The area on the left first second webspace is now gone through to the plantar part of his foot. The area on the left foot anteriorly is irritated with erythema and swelling in the  forefoot. 09/25/2018 His wound on the left plantar heel looks better. Using silver collagen The area on the left buttock 2 small remnant areas. One is closed one is still open. Using silver alginate The areas between both his first and second toes look worse. This in spite of long-standing antifungal therapy with ketoconazole and silver alginate which should have antifungal activity He has small areas around his original wound on the left calf one is on the bottom of the original scar tissue and one superiorly both of these are small and superficial but again given wound history in this site this is  worrisome 10/02/2018 Left plantar heel continues to gradually contract using silver collagen Left buttock wound is unchanged using silver alginate The areas on his dorsal feet between his first and second toes bilaterally look about the same. I prescribed clindamycin ointment to see if we can address chronic staph colonization and also the underlying possibility of erythrasma The left lateral lower extremity wound is actually on the lateral part of his ankle. Small open area here. We have been using silver alginate 10/09/2018; Left plantar heel continues to look healthy and contract. No debridement is required Left buttock slightly smaller with a tape injury wound just below which was new this week Dorsal feet somewhat improved I have been using clindamycin Left lateral looks lower extremity the actual open area looks worse although a lot of this is epithelialized. I am going to change to silver collagen today He has a lot more swelling in the right leg although this is not pitting not red and not particularly warm there is a lot of spasm in the right leg usually indicative of people with paralysis of some underlying discomfort. We have reviewed his vascular status from 2017 he had a left greater saphenous vein ablation. I wonder about referring him back to vascular surgery if the area on the left  leg continues to deteriorate. 10/16/2018 in today for follow-up and management of multiple lower extremity ulcers. His left Buttock wound is much lower smaller and almost closed completely. The wound to the left ankle has began to reopen with Epithelialization and some adherent slough. He has multiple new areas to the left foot and leg. The left dorsal foot without much improvement. Wound present between left great webspace and 2nd toe. Erythema and edema present right leg. Right LE ultrasound obtained on 10/10/18 was negative for DVT . 10/23/2018; Left buttock is closed over. Still dry macerated skin but there is no open wound. I suspect this is chronic pressure/moisture Left lateral calf is quite a bit worse than when I saw this last. There is clearly drainage here he has macerated skin into the left plantar heel. We will change the primary dressing to alginate Left dorsal foot has some improvement in overall wound area. Still using clindamycin and silver alginate Right dorsal foot about the same as the left using clindamycin and silver alginate The erythema in the right leg has resolved. He is DVT rule out was negative Left heel pressure area required debridement although the wound is smaller and the surface is health 10/26/2018 The patient came back in for his nurse check today predominantly because of the drainage coming out of the left lateral leg with a recent reopening of his original wound on the left lateral calf. He comes in today with a large amount of surrounding erythema around the wound extending from the calf into the ankle and even in the area on the dorsal foot. He is not systemically unwell. He is not febrile. Nevertheless this looks like cellulitis. We have been using silver alginate to the area. I changed him to a regular visit and I am going to prescribe him doxycycline. The rationale here is a long list of medication intolerances and a history of MRSA. I did not see anything  that I thought would provide a valuable culture 10/30/2018 Follow-up from his appointment 4 days ago with really an extensive area of cellulitis in the left calf left lateral ankle and left dorsal foot. I put him on doxycycline. He has a long list of medication allergies  which are true allergy reactions. 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 In any case he is a lot better today. The erythema especially in the anterior and lateral left calf is better. He still has left ankle erythema. He also is complaining about increasing edema in the right leg we have only been using Kerlix Coban and he has been doing the wraps at home. Finally he has a spotty rash on the medial part of his upper left calf which looks like folliculitis or perhaps wrap occlusion type injury. Small superficial macules not pustules 11/06/18 patient arrives today with again a considerable degree of erythema around the wound on the left lateral calf extending into the dorsal ankle and dorsal foot. This is a lot worse than when I saw this last week. He is on doxycycline really with not a lot of improvement. He has not been systemically unwell Wounds on the; left heel actually looks improved. Original area on the left foot and proximity to the first and second toes looks about the same. He has superficial areas on the dorsal foot, anterior calf and then the reopening of his original wound on the left lateral calf which looks about the same The only area he has on the right is the dorsal webspace first and second which is smaller. He has a large area of dry erythematous skin on the left buttock small open area here. 11/13/2018; the patient arrives in much better condition. The erythema around the wound on the left lateral calf is a lot better. 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 In any case this is a lot better. The area on the left heel is very small and just about resolved using silver collagen we have been using silver alginate to the areas on his dorsal feet 11/20/2018; his wounds include the left lateral calf, left heel, dorsal aspects of both feet just proximal to the first second webspace. He is stable to slightly improved. I did not think any changes to his dressings were going to be necessary 11/27/2018 he has a reopening on the left buttock which is surrounded by what looks like tinea or perhaps some other form of dermatitis. The area on the left dorsal foot has some erythema around it I have marked this area but I am not sure whether this is cellulitis or not. Left heel is not closed. Left calf the reopening is really slightly longer and probably worse 1/13; in general things look better and smaller except for the left dorsal foot. Area on the left heel is just about closed, left buttock looks better only a small wound remains in the skin looks better [using Lotrisone] 1/20; the area on the left heel only has a few remaining open areas here. Left lateral calf about the same in terms of size, left dorsal foot slightly larger right lateral foot still not closed. The area on the left buttock has no open wound and the surrounding skin looks a lot better 1/27; the area on the left heel is closed. Left lateral calf better but still requiring extensive debridements. The area on his left buttock is closed. He still has the open areas on the left dorsal foot which is slightly smaller in the right foot which is slightly expanded. We have been using Iodoflex on these areas as well 2/3; left heel is  closed. Left lateral calf still requiring debridement using Iodoflex there is no open area on his left buttock however he has dry scaly skin over a large area of this. Not really responding well to the Lotrisone. Finally the areas on his dorsal feet at the level of the  first second webspace are slightly smaller on the right and about the same on the left. Both of these vigorously debrided with Anasept and gauze 2/10; left heel remains closed he has dry erythematous skin over the left buttock but there is no open wound here. Left lateral leg has come in and with. Still requiring debridement we have been using Iodoflex here. Finally the area on the left dorsal foot and right dorsal foot are really about the same extremely dry callused fissured areas. He does not yet have a dermatology appointment 2/17; left heel remains closed. He has a new open area on the left buttock. The area on the left lateral calf is bigger longer and still covered in necrotic debris. No major change in his foot areas bilaterally. I am awaiting for a dermatologist to look on this. We have been using ketoconazole I do not know that this is been doing any good at all. 2/24; left heel remains closed. The left buttock wound that was new reopening last week looks better. The left lateral calf appears better also although still requires debridement. The major area on his foot is the left first second also requiring debridement. We have been putting Prisma on all wounds. I do not believe that the ketoconazole has done too much good for his feet. He will use Lotrisone I am going to give him a 2-week course of terbinafine. We still do not have a dermatology appointment 3/2 left heel remains closed however there is skin over bone in this area I pointed this out to him today. The left buttock wound is epithelialized but still does not look completely stable. The area on the left leg required debridement were using silver collagen here. With regards to his feet we changed to Lotrisone last week and silver alginate. 3/9; left heel remains closed. Left buttock remains closed. The area on the right foot is essentially closed. The left foot remains unchanged. Slightly smaller on the left lateral calf. Using  silver collagen to both of these areas 3/16-Left heel remains closed. Area on right foot is closed. Left lateral calf above the lateral malleolus open wound requiring debridement with easy bleeding. Left dorsal wound proximal to first toe also debrided. Left ischial area open new. Patient has been using Prisma with wrapping every 3 days. Dermatology appointment is apparently tomorrow.Patient has completed his terbinafine 2-week course with some apparent improvement according to him, there is still flaking and dry skin in his foot on the left 3/23; area on the right foot is reopened. The area on the left anterior foot is about the same still a very necrotic adherent surface. He still has the area on the left leg and reopening is on the left buttock. He apparently saw dermatology although I do not have a note. According to the patient who is usually fairly well informed they did not have any good ideas. Put him on oral terbinafine which she is been on before. 3/30; using silver collagen to all wounds. Apparently his dermatologist put him on doxycycline and rifampin presumably some culture grew staph. I do not have this result. He remains on terbinafine although I have used terbinafine on him before 4/6; patient has had  a fairly substantial reopening on the right foot between the first and second toes. He is finished his terbinafine and I believe is on doxycycline and rifampin still as prescribed by dermatology. We have been using silver collagen to all his wounds although the patient reports that he thinks silver alginate does better on the wounds on his buttock. 4/13; the area on his left lateral calf about the same size but it did not require debridement. Left dorsal foot just proximal to the webspace between the first and second toes is about the same. Still nonviable surface. I note some superficial bronze discoloration of the dorsal part of his foot Right dorsal foot just proximal to the first  and second toes also looks about the same. I still think there may be the same discoloration I noted above on the left Left buttock wound looks about the same 4/20; left lateral calf appears to be gradually contracting using silver collagen. 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 The left dorsal foot wound is debrided of tightly adherent necrotic debris and really cleans up quite nicely. The right area is worse with expansion. I did not debride this it is now over the base of the second toe The area on his left buttock is smaller no debridement is required using silver collagen 5/4; left calf continues to make good progress. He arrives with erythema around the wounds on his dorsal foot which even extends to the plantar aspect. Very concerning for coexistent infection. He is finished the erythromycin I gave him for possible erythrasma this does not seem to have helped. The area on the left foot is about the same base of the dorsal toes Is area on the buttock looks improved on the left 5/11; left calf and left buttock continued to make good progress. Left foot is about the same to slightly improved. Major problem is on the right foot. He has not had an x-ray. Deep tissue culture I did last week showed both Enterobacter and E. coli. I did not change the doxycycline I put him on empirically although neither 1 of these were plated to doxycycline. He arrives today with the erythema looking worse on both the dorsal and plantar foot. Macerated skin on the bottom of the foot. he has not been systemically unwell 5/18-Patient returns at 1 week, left calf wound appears to be making some progress, left buttock wound appears slightly worse than last time, left foot wound looks slightly better, right foot redness is marginally better. X-ray of both feet show no air or evidence of osteomyelitis. Patient is finished his Omnicef and terbinafine. He  continues to have macerated skin on the bottom of the left foot as well as right 5/26; left calf wound is better, left buttock wound appears to have multiple small superficial open areas with surrounding macerated skin. X-rays that I did last time showed no evidence of osteomyelitis in either foot. He is finished cefdinir and doxycycline. I do not think that he was on terbinafine. He continues to have a large superficial open area on the right foot anterior dorsal and slightly between the first and second toes. I did send him to dermatology 2 months ago or so wondering about whether they would do a fungal scraping. I do not believe they did but did do a culture. We have been using silver alginate to the toe areas, he has been using antifungals at home topically either ketoconazole or Lotrisone. We are using silver  collagen on the left foot, silver alginate on the right, silver collagen on the left lateral leg and silver alginate on the left buttock 6/1; left buttock area is healed. We have the left dorsal foot, left lateral leg and right dorsal foot. We are using silver alginate to the areas on both feet and silver collagen to the area on his left lateral calf 6/8; the left buttock apparently reopened late last week. He is not really sure how this happened. He is tolerating the terbinafine. Using silver alginate to all wounds 6/15; left buttock wound is larger than last week but still superficial. Came in the clinic today with a report of purulence from the left lateral leg I did not identify any infection Both areas on his dorsal feet appear to be better. He is tolerating the terbinafine. Using silver alginate to all wounds 6/22; left buttock is about the same this week, left calf quite a bit better. His left foot is about the same however he comes in with erythema and warmth in the right forefoot once again. Culture that I gave him in the beginning of May showed Enterobacter and E. coli. I gave him  doxycycline and things seem to improve although neither 1 of these organisms was specifically plated. 6/29; left buttock is larger and dry this week. Left lateral calf looks to me to be improved. Left dorsal foot also somewhat improved right foot completely unchanged. The erythema on the right foot is still present. He is completing the Ceftin dinner that I gave him empirically [see discussion above.) 7/6 - All wounds look to be stable and perhaps improved, the left buttock wound is slightly smaller, per patient bleeds easily, completed ceftin, the right foot redness is less, he is on terbinafine 7/13; left buttock wound about the same perhaps slightly narrower. Area on the left lateral leg continues to narrow. Left dorsal foot slightly smaller right foot about the same. We are using silver alginate on the right foot and Hydrofera Blue to the areas on the left. Unna boot on the left 2 layer compression on the right 7/20; left buttock wound absolutely the same. Area on lateral leg continues to get better. Left dorsal foot require debridement as did the right no major change in the 7/27; left buttock wound the same size necrotic debris over the surface. The area on the lateral leg is closed once again. His left foot looks better right foot about the same although there is some involvement now of the posterior first second toe area. He is still on terbinafine which I have given him for a month, not certain a centimeter major change 06/25/19-All wounds appear to be slightly improved according to report, left buttock wound looks clean, both foot wounds have minimal to no debris the right dorsal foot has minimal slough. We are using Hydrofera Blue to the left and silver alginate to the right foot and ischial wound. 8/10-Wounds all appear to be around the same, the right forefoot distal part has some redness which was not there before, however the wound looks clean and small. Ischial wound looks about the  same with no changes 8/17; his wound on the left lateral calf which was his original chronic venous insufficiency wound remains closed. Since I last saw him the areas on the left dorsal foot right dorsal foot generally appear better but require debridement. The area on his left initial tuberosity appears somewhat larger to me perhaps hyper granulated and bleeds very easily. We have been  using Hydrofera Blue to the left dorsal foot and silver alginate to everything else 8/24; left lateral calf remains closed. The areas on his dorsal feet on the webspace of the first and second toes bilaterally both look better. The area on the left buttock which is the pressure ulcer stage II slightly smaller. I change the dressing to Hydrofera Blue to all areas 8/31; left lateral calf remains closed. The area on his dorsal feet bilaterally look better. Using Hydrofera Blue. Still requiring debridement on the left foot. No change in the left buttock pressure ulcers however 9/14; left lateral calf remains closed. Dorsal feet look quite a bit better than 2 weeks ago. Flaking dry skin also a lot better with the ammonium lactate I gave him 2 weeks ago. The area on the left buttock is improved. He states that his Roho cushion developed a leak and he is getting a new one, in the interim he is offloading this vigorously 9/21; left calf remains closed. Left heel which was a possible DTI looks better this week. He had macerated tissue around the left dorsal foot right foot looks satisfactory and improved left buttock wound. I changed his dressings to his feet to silver alginate bilaterally. Continuing Hydrofera Blue on the left buttock. 9/28 left calf remains closed. Left heel did not develop anything [possible DTI] dry flaking skin on the left dorsal foot. Right foot looks satisfactory. Improved left buttock wound. We are using silver alginate on his feet Hydrofera Blue on the buttock. I have asked him to go back to the  Lotrisone on his feet including the wounds and surrounding areas 10/5; left calf remains closed. The areas on the left and right feet about the same. A lot of this is epithelialized however debris over the remaining open areas. He is using Lotrisone and silver alginate. The area on the left buttock using Hydrofera Blue 10/26. Patient has been out for 3 weeks secondary to Covid concerns. He tested negative but I think his wife tested positive. He comes in today with the left foot substantially worse, right foot about the same. Even more concerning he states that the area on his left buttock closed over but then reopened and is considerably deeper in one aspect than it was before [stage III wound] 11/2; left foot really about the same as last week. Quarter sized wound on the dorsal foot just proximal to the first second toes. Surrounding erythema with areas of denuded epithelium. This is not really much different looking. Did not look like cellulitis this time however. Right foot area about the same.. We have been using silver alginate alginate on his toes Left buttock still substantial irritated skin around the wound which I think looks somewhat better. We have been using Hydrofera Blue here. 11/9; left foot larger than last week and a very necrotic surface. Right foot I think is about the same perhaps slightly smaller. Debris around the circumference also addressed. Unfortunately on the left buttock there is been a decline. Satellite lesions below the major wound distally and now a an additional one posteriorly we have been using Hydrofera Blue but I think this is a pressure issue 11/16; left foot ulcer dorsally again a very adherent necrotic surface. Right foot is about the same. Not much change in the pressure ulcer on his left buttock. 11/30; left foot ulcer dorsally basically the same as when I saw him 2 weeks ago. Very adherent fibrinous debris on the wound surface. Patient reports a lot  of  drainage as well. 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 We have been using Iodoflex, patient changed back to alginate because of the drainage. Area on his right dorsal foot really looks benign with a healthier surface certainly a lot better than on the left. Left buttock wounds all improved using Hydrofera Blue 12/7; left dorsal foot again no improvement. Tightly adherent debris. PCR culture I did last week only showed likely skin contaminant. I have gone ahead and done a punch biopsy of this which is about the last thing in terms of investigations I can think to do. He has known venous insufficiency and venous hypertension and this could be the issue here. The area on the right foot is about the same left buttock slightly worse according to our intake nurse secondary to [facility] Blue sticking to the wound 12/14; biopsy of the left foot that I did last time showed changes that could be related to wound healing/chronic stasis dermatitis phenomenon no neoplasm. We have been using silver alginate to both feet. I change the one on the left today to Sorbact and silver alginate to his other 2 wounds 12/28; the patient arrives with the following problems; Major issue is the dorsal left foot which continues to be a larger deeper wound area. Still with a completely nonviable surface Paradoxically the area mirror image on the right on the right dorsal foot appears to be getting better. He had some loss of dry denuded skin from the lower part of his original wound on the left lateral calf. Some of this area looked a little vulnerable and for this reason we put him in wrap that on this side this week The area on his left buttock is larger. He still has the erythematous circular area which I think is a combination of pressure, sweat. This does not look like cellulitis or fungal dermatitis 11/26/2019; -Dorsal left foot large open wound with depth. Still  debris over the surface. Using Sorbact The area on the dorsal right foot paradoxically has closed over He has a reopening on the left ankle laterally at the base of his original wound that extended up into the calf. This appears clean. The left buttock wound is smaller but with very adherent necrotic debris over the surface. We have been using silver alginate here as well The patient had arterial studies done in 2017. He had biphasic waveforms at the dorsalis pedis and posterior tibial bilaterally. ABI in the left was 1.17. Digit waveforms were dampened. He has slight spasticity in the great toes I do not think a TBI would be possible 1/11; the patient comes in today with a sizable reopening between the first and second toes on the right. This is not exactly in the same location where we have been treating wounds previously. According to our intake nurse this was actually fairly deep but 0.6 cm. The area on the left dorsal foot looks about the same the surface is somewhat cleaner using Sorbact, his MRI is in 2 days. We have not managed yet to get arterial studies. The new reopening on the left lateral calf looks somewhat better using alginate. The left buttock wound is about the same using alginate 1/18; the patient had his ARTERIAL studies which were quite normal. ABI in the right at 1.13 with triphasic/biphasic waveforms on the left ABI 1.06 again with triphasic/biphasic waveforms. It would not have been possible to have done a toe brachial index because of spasticity. We have  been using Sorbac to the left foot alginate to the rest of his wounds on the right foot left lateral calf and left buttock 1/25; arrives in clinic with erythema and swelling of the left forefoot worse over the first MTP area. This extends laterally dorsally and but also posteriorly. Still has an area on the left lateral part of the lower part of his calf wound it is eschared and clearly not closed. Area on the left buttock  still with surrounding irritation and erythema. Right foot surface wound dorsally. The area between the right and first and second toes appears better. 2/1; The left foot wound is about the same. Erythema slightly better I gave him a week of doxycycline empirically Right foot wound is more extensive extending between the toes to the plantar surface Left lateral calf really no open surface on the inferior part of his original wound however the entire area still looks vulnerable Absolutely no improvement in the left buttock wound required debridement. 2/8; the left foot is about the same. Erythema is slightly improved I gave him clindamycin last week. Right foot looks better he is using Lotrimin and silver alginate He has a breakdown in the left lateral calf. Denuded epithelium which I have removed Left buttock about the same were using Hydrofera Blue 2/15; left foot is about the same there is less surrounding erythema. Surface still has tightly adherent debris which I have debriding however not making any progress Right foot has a substantial wound on the medial right second toe between the first and second webspace. Still an open area on the left lateral calf distal area. Buttock wound is about the same 2/22; left foot is about the same less surrounding erythema. Surface has adherent debris. Polymen Ag Right foot area significant wound between the first and second toes. We have been using silver alginate here Left lateral leg polymen Ag at the base of his original venous insufficiency wound Left buttock some improvement here 3/1; Right foot is deteriorating in the first second toe webspace. Larger and more substantial. We have been using silver alginate. Left dorsal foot about the same markedly adherent surface debris using PolyMem Ag Left lateral calf surface debris using PolyMem AG Left buttock is improved again using PolyMem Ag. He is completing his terbinafine. The erythema in the foot  seems better. He has been on this for 2 weeks 3/8; no improvement in any wound area in fact he has a small open area on the dorsal midfoot which is new this week. He has not gotten his foot x-rays yet 3/15; his x-rays were both negative for osteomyelitis of both feet. No major change in any of his wounds on the extremities however his buttock wounds are better. We have been using polymen on the buttocks, left lower leg. Iodoflex on the left foot and silver alginate on the right 3/22; arrives in clinic today with the 2 major issues are the improvement in the left dorsal foot wound which for once actually looks healthy with a nice healthy wound surface without debridement. Using Iodoflex here. Unfortunately on the left lateral calf which is in the distal part of his original wound he came to the clinic here for there was purulent drainage noted some increased breakdown scattered around the original area and a small area proximally. We we are using polymen here will change to silver alginate today. His buttock wound on the left is better and I think the area on the right first second toe webspace is  also improved 3/29; left dorsal foot looks better. Using Iodoflex. Left ankle culture from deterioration last time grew E. coli, Enterobacter and Enterococcus. I will give him a course of cefdinir although that will not cover Enterococcus. The area on the right foot in the webspace of the first and second toe lateral first toe looks better. The area on his buttock is about healed Vascular appointment is on April 21. This is to look at his venous system vis--vis continued breakdown of the wounds on the left including the left lateral leg and left dorsal foot he. He has had previous ablations on this side 4/5; the area between the right first and second toes lateral aspect of the first toe looks better. Dorsal aspect of the left first toe on the left foot also improved. Unfortunately the left lateral lower leg  is larger and there is a second satellite wound superiorly. The usual superficial abrasions on the left buttock overall better but certainly not closed 4/12; the area between the right first and second toes is improved. Dorsal aspect of the left foot also slightly smaller with a vibrant healthy looking surface. No real change in the left lateral leg and the left buttock wound is healed He has an unaffordable co-pay for Apligraf. Appointment with vein and vascular with regards to the left leg venous part of the circulation is on 4/21 4/19; we continue to see improvement in all wound areas. Although this is minor. He has his vascular appointment on 4/21. The area on the left buttock has not reopened although right in the center of this area the skin looks somewhat threatened 4/26; the left buttock is unfortunately reopened. In general his left dorsal foot has a healthy surface and looks somewhat smaller although it was not measured as such. The area between his first and second toe webspace on the right as a small wound against the first toe. The patient saw vascular surgery. The real question I was asking was about the small saphenous vein on the left. He has previously ablated left greater saphenous vein. Nothing further was commented on on the left. Right greater saphenous vein without reflux at the saphenofemoral junction or proximal thigh there was no indication for ablation of the right greater saphenous vein duplex was negative for DVT bilaterally. They did not think there was anything from a vascular surgery point of view that could be offered. They ABIs within normal limits 5/3; only small open area on the left buttock. The area on the left lateral leg which was his original venous reflux is now 2 wounds both which look clean. We are using Iodoflex on the left dorsal foot which looks healthy and smaller. He is down to a very tiny area between the right first and second toes, using  silver alginate 5/10; all of his wounds appear better. We have much better edema control in 4 layer compression on the left. This may be the factor that is allowing the left foot and left lateral calf to heal. He has external compression garments at home 04/14/20-All of his wounds are progressing well, the left forefoot is practically closed, left ischium appears to be about the same, right toe webspace is also smaller. The left lateral leg is about the same, continue using Hydrofera Blue to this, silver alginate to the ischium, Iodoflex to the toe space on the right 6/7; most of his wounds outside of the left buttock are doing well. The area on the left lateral calf and left  dorsal foot are smaller. The area on the right foot in between the first and second toe webspace is barely visible although he still says there is some drainage here is the only reason I did not heal this out. Unfortunately the area on the left buttock almost looks like he has a skin tear from tape. He has open wound and then a large flap of skin that we are trying to get adherence over an area just next to the remaining wound 6/21; 2 week follow-up. I believe is been here for nurse visits. Miraculously the area between his first and second toes on the left dorsal foot is closed over. Still open on the right first second web space. The left lateral calf has 2 open areas. Distally this is more superficial. The proximal area had a little more depth and required debridement of adherent necrotic material. His buttock wound is actually larger we have been using silver alginate here 6/28; the patient's area on the left foot remains closed. Still open wet area between the first and second toes on the right and also extending into the plantar aspect. We have been using silver alginate in this location. He has 2 areas on the left lower leg part of his original long wounds which I think are better. We have been using Hydrofera Blue here.  Hydrofera Blue to the left buttock which is stable 7/12; left foot remains closed. Left ankle is closed. May be a small area between his right first and second toes the only truly open area is on the left buttock. We have been using Hydrofera Blue here 7/19; patient arrives with marked deterioration especially in the left foot and ankle. We did not put him in a compression wrap on the left last week in fact he wore his juxta lite stockings on either side although he does not have an underlying stocking. He has a reopening on the left dorsal foot, left lateral ankle and a new area on the right dorsal ankle. More worrisome is the degree of erythema on the left foot extending on the lateral foot into the lateral lower leg on the left 7/26; the patient had erythema and drainage from the lateral left ankle last week. Culture of this grew MRSA resistant to doxycycline and clindamycin which are the 2 antibiotics we usually use with this patient who has multiple antibiotic allergies including linezolid, trimethoprim sulfamethoxazole. I had give him an empiric doxycycline and he comes in the area certainly looks somewhat better although it is blotchy in his lower leg. He has not been systemically unwell. He has had areas on the left dorsal foot which is a reopening, chronic wounds on the left lateral ankle. Both of these I think are secondary to chronic venous insufficiency. The area between his first and second toes is closed as far as I can tell. He had a new wrap injury on the right dorsal ankle last week. Finally he has an area on the left buttock. We have been using silver alginate to everything except the left buttock we are using Hydrofera Blue 06/30/20-Patient returns at 1 week, has been given a sample dose pack of NUZYRA which is a tetracycline derivative [omadacycline], patient has completed those, we have been using silver alginate to almost all the wounds except the left ischium where we are using  Hydrofera Blue all of them look better 8/16; since I last saw the patient he has been doing well. The area on the left buttock, left lateral  ankle and left foot are all closed today. He has completed the [place] I gave him last time and tolerated this well. He still has open areas on the right dorsal ankle and in the right first second toe area which we are using silver alginate. 8/23; we put him in his bilateral external compression stockings last week as he did not have anything open on either leg except for concerning area between the right first and second toe. He comes in today with an area on the left dorsal foot slightly more proximal than the original wound, the left lateral foot but this is actually a continuation of the area he had on the left lateral ankle from last time. As well he is opened up on the left buttock again. 8/30; comes in today with things looking a lot better. The area on the left lower ankle has closed down as has the left foot but with eschar in both areas. The area on the dorsal right ankle is also epithelialized. Very little remaining of the left buttock wound. We have been using silver alginate on all wound areas 9/13; the area in the first second toe webspace on the right has fully epithelialized. He still has some vulnerable epithelium on the right and the ankle and the dorsal foot. He notes weeping. He is using his juxta lite stocking. On the left again the left dorsal foot is closed left lateral ankle is closed. We went to the juxta lite stocking here as well. Still vulnerable in the left buttock although only 2 small open areas remain here 9/27; 2-week follow-up. We did not look at his left leg but the patient says everything is closed. He is a bit disturbed by the amount of edema in his left foot he is using juxta lite stockings but asking about over the toes stockings which would be 30/40, will talk to him next time. According to him there is no open wound on  either the left foot or the left ankle/calf He has an open area on the dorsal right calf which I initially point a wrap injury. He has superficial remaining wound on the left ischial tuberosity been using silver alginate although he says this sticks to the wound 10/5; we gave him 2-week follow-up but he called yesterday expressing some concerns about his right foot right ankle and the left buttock. He came in early. There is still no open areas on the left leg and that still in his juxta lite stocking 10/11; he only has 1 small area on the left buttock that remains measuring millimeters 1 mm. Still has the same irritated skin in this area. We recommended zinc oxide when this eventually closes and pressure relief is meticulously is he can do this. He still has an area on the dorsal part of his right first through third toes which is a bit irritated and still open and on the dorsal ankle near the crease of the ankle. We have been using silver alginate and using his own stocking. He has nothing open on the left leg or foot 10/25; 2-week follow-up. Not nearly as good on the left buttock as I was hoping. For open areas with 5 looking threatened small. He has the erythematous irritated chronic skin in this area. 1 area on the right dorsal ankle. He reports this area bleeds easily Right dorsal foot just proximal to the base of his toes We have been using silver alginate. 11/8; 2-week follow-up. Left buttock is about the same  although I do not think the wounds are in the same location we have been using silver alginate. I have asked him to use zinc oxide on the skin around the wounds. He still has a small area on the right dorsal ankle he reports this bleeds easily Right dorsal foot just proximal to the base of the toes does not have anything open although the skin is very dry and scaly He has a new opening on the nailbed of the left great toe. Nothing on the left ankle 11/29; 3-week follow-up. Left  buttock has 2 open areas. And washing of these wounds today started bleeding easily. Suggesting very friable tissue. We have been using silver alginate. Right dorsal ankle which I thought was initially a wrap injury we have been using silver alginate. Nothing open between the toes that I can see. He states the area on the left dorsal toe nailbed healed after the last visit in 2 or 3 days 12/13; 3-week follow-up. His left buttock now has 3 open areas but the original 2 areas are smaller using polymen here. Surrounding skin looks better. The right dorsal ankle is closed. He has a small opening on the right dorsal foot at the level of the third toe. In general the skin looks better here. He is wearing his juxta lite stocking on the left leg says there is nothing open 11/24/2020; 3 weeks follow-up. His left buttock still has the 3 open areas. We have been using polymen but due to lack of response he changed to [facility] area. Surrounding skin is dry erythematous and irritated looking. There is no evidence of infection either bacterial or fungal however there is loss of surface epithelium He still has very dry skin in his foot causing irritation and erythema on the dorsal part of his toes. This is not responded to prolonged courses of antifungal simply looks dry and irritated 1/24; left buttock area still looks about the same he was unable to find the triad ointment that we had suggested. The area on the right lower leg just above the dorsal ankle has reopened and the areas on the right foot between the first second and second third toes and scaling on the bottom of the foot has been about the same for quite some time now. been using silver alginate to all wound areas 2/7; left buttock wound looked quite good although not much smaller in terms of surface area surrounding skin looks better. Only a few dry flaking areas on the right foot in between the first and second toes the skin generally looks better  here [ammonium lactate]. Finally the area on the right dorsal ankle is closed 2/21; There is no open area on the right foot even between the right first and second toe. Skin around this area dorsally and plantar aspects look better. He has a reopening of the area on the right ankle just above the crease of the ankle dorsally. I continue to think that this is probably friction from spasms may be even this time with his stocking under the compression stockings. Wounds on his left buttock look about the same there a couple of areas that have reopened. He has a total square area of loss of epithelialization. This does not look like infection it looks like a contact dermatitis but I just cannot determine to what 3/14; there is nothing on the right foot between the first and second toes this was carefully inspected under illumination. Some chronic irritation on the dorsal part  of his foot from toes 1-3 at the base. Nothing really open here substantially. Still has an area on the right foot/ankle that is actually larger and hyper granulated. His buttock area on the left is just about closed however he has chronic inflammation with loss of the surface epithelial layer 3/28; 2-week follow-up. In clinic today with a new wound on the left anterior mid tibia. Says this happened about 2 weeks ago. He is not really sure how wonders about the spasticity of his legs at night whether that could have caused this other than that he does not have a good idea. He has been using topical antibiotics and silver alginate. The area on his right dorsal ankle seems somewhat better. Finally everything on his left buttock is closed. 4/11; 2-week follow-up. All of his wounds are better except for the area over the ischium and left buttock which have opened up widely again. At least part of this is covered in necrotic fibrinous material another part had rolled nonviable skin. The area on the right ankle, left anterior mid tibia are  both a lot better. He had no open wounds on either foot including the areas between the first and second toes 4/25; patient presents for 2-week follow-up. He states that the wounds are overall stable. He has no complaints today and states he is using Hydrofera Blue to open wounds. 5/9; have not seen this man in over a month. For my memory he has open areas on the left mid tibia and right ankle. T oday he has new open area on the right dorsal foot which we have not had a problem with recently. He has the sustained area on the left buttock He is also changed his insurance at the beginning of the year [employer]. We will need prior authorizations for debridement 5/23; patient presents for 2-week follow-up. He has prior authorizations for debridement. He denies any issues in the past 2 weeks with his wound care. He has been using Hydrofera Blue to all the wounds. He does report a circular rash to the upper left leg that is new. He denies acute signs of infection. 6/6; 2-week follow-up. The patient has open wounds on the left buttock which are worse than the last time I saw this about a month ago. He also has a new area to me on the left anterior mid tibia with some surrounding erythema. The area on the dorsal ankle on the right is closed but I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 require aggressive surgical debridement. Not yet ready for Apligraf 03/11/16; aggressive debridement again. Not ready for Apligraf 03/18/16 aggressive debridement again. 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 Has been to see vascular surgery he is being planned for a venous ablation 03/25/16; aggressive debridement again of both wound areas on the left lateral leg. He is due for ablation surgery on May 22. He is much closer to being ready for an Apligraf. Has a new area between the left first and second toes 04/01/16 aggressive debridement done of both wounds. The new wound at the base of between his second and first toes looks stable 04/08/16; continued aggressive debridement of both wounds on the left lower leg. He goes for his venous ablation on Monday. The new wound at the base of his first and second toes dorsally appears stable. 04/15/16; wounds aggressively debridement although the base of this looks considerably better Apligraf #1. He had ablation surgery on Monday I'll need to research these records. We only have approval for four Apligraf's 04/22/16; the patient is here for a wound check [Apligraf last week] intake nurse concerned about erythema around the wounds. Apparently a significant degree of drainage. The patient has chronic venous inflammation which I think accounts for most of this however I was asked to look at this today 04/26/16; the patient came back for check of possible cellulitis in his left foot however the Apligraf dressing was inadvertently removed therefore we elected to prep the wound for a second Apligraf. I put him on doxycycline on 6/1 the  erythema in the foot 05/03/16 we did not remove the dressing from the superior wound as this is where I put all of his last Apligraf. Surface debridement done with a curette of the lower wound which looks very healthy. The area on the left foot also looks quite satisfactory at the dorsal artery at the first and second toes 05/10/16; continue Apligraf to this. Her wound, Hydrafera to the lower wound. He has a new area on the right second toe. Left dorsal foot firstoosecond toe also looks improved 05/24/16; wound dimensions must be smaller I was able to use Apligraf to all 3 remaining wound areas. 06/07/16 patient's last Apligraf was 2 weeks ago. He arrives today with the 2 wounds on his lateral left leg joined together. This would have to be seen as a negative. He also has a small wound in his first and second toe on the left dorsally with quite a bit of surrounding erythema in the first second and third toes. This looks to be infected or inflamed, very difficult clinical call. 06/21/16: lateral left leg combined wounds. Adherent surface slough area on the left dorsal foot at roughly the fourth toe looks improved 07/12/16; he now has a single linear wound on the lateral left  leg. This does not look to be a lot changed from when I lost saw this. The area on his dorsal left foot looks considerably better however. 08/02/16; no major change in the substantial area on his left lateral leg since last time. We have been using Hydrofera Blue for a prolonged period of time now. The area on his left foot is also unchanged from last review 07/19/16; the area on his dorsal foot on the left looks considerably smaller. He is beginning to have significant rims of epithelialization on the lateral left leg wound. This also looks better. 08/05/16; the patient came in for a nurse visit today. Apparently the area on his left lateral leg looks better and it was wrapped. However in general discussion the patient noted a new area  on the dorsal aspect of his right second toe. The exact etiology of this is unclear but likely relates to pressure. 08/09/16 really the area on the left lateral leg did not really look that healthy today perhaps slightly larger and measurements. The area on his dorsal right second toe is improved also the left foot wound looks stable to improved 08/16/16; the area on the last lateral leg did not change any of dimensions. Post debridement with a curet the area looked better. Left foot wound improved and the area on the dorsal right second toe is improved 08/23/16; the area on the left lateral leg may be slightly smaller both in terms of length and width. Aggressive debridement with a curette afterwards the tissue appears healthier. Left foot wound appears improved in the area on the dorsal right second toe is improved 08/30/16 patient developed a fever over the weekend and was seen in an urgent care. Felt to have a UTI and put on doxycycline. He has been since changed over the phone to [facility]. After we took off the wrap on his right leg today the leg is swollen warm and erythematous, probably more likely the source of the fever 09/06/16; have been using collagen to the major left leg wound, silver alginate to the area on his anterior foot/toes 09/13/16; the areas on his anterior foot/toes on both sides appear to be virtually closed. Extensive wound on the left lateral leg perhaps slightly narrower but each visit still covered an adherent surface slough 09/16/16 patient was in for his usual Thursday nurse visit however the intake nurse noted significant erythema of his dorsal right foot. He is also running a low- grade fever and having increasing spasms in the right leg 09/20/16 here for cellulitis involving his right great toes and forefoot. This is a lot better. Still requiring debridement on his left lateral leg. Santyl direct says he needs prior authorization. Therefore his wife cannot change this at  home 09/30/16; the patient's extensive area on the left lateral calf and ankle perhaps somewhat better. Using Santyl. The area on the left toes is healed and I think the area on his right dorsal foot is healed as well. There is no cellulitis or venous inflammation involving the right leg. He is going to need compression stockings here. 10/07/16; the patient's extensive wound on the left lateral calf and ankle does not measure any differently however there appears to be less adherent surface slough using Santyl and aggressive weekly debridements 10/21/16; no major change in the area on the left lateral calf. Still the same measurement still very difficult to debridement adherent slough and nonviable subcutaneous tissue. This is not really been helped by several weeks of Santyl. Previously  for 2 weeks I used Iodoflex for a short period. A prolonged course of Hydrofera Blue didn't really help. I'm not sure why I only used 2 weeks of Iodoflex on this there is no evidence of surrounding infection. He has a small area on the right second toe which looks as though it's progressing towards closure 10/28/16; the wounds on his toes appear to be closed. No major change in the left lateral leg wound although the surface looks somewhat better using Iodoflex. He has had previous arterial studies that were normal. He has had reflux studies and is status post ablation although I don't have any exact notes on which vein was ablated. I'll need to check the surgical record 11/04/16; he's had a reopening between the first and second toe on the left and right. No major change in the left lateral leg wound. There is what appears to be cellulitis of the left dorsal foot 11/18/16 the patient was hospitalized initially in [place] and then subsequently transferred to [facility] long and was admitted there from 11/09/16 through 11/12/16. He had developed progressive cellulitis on the right leg in spite of the doxycycline I gave him.  I'd spoken to the hospitalist in [place] who was concerned about continuing leukocytosis. CT scan is what I suggested this was done which showed soft tissue swelling without evidence of osteomyelitis or an underlying abscess blood cultures were negative. At [facility] he was treated with vancomycin and Primaxin and then add an infectious disease consult. He was transitioned to Ceftaroline. He has been making progressive improvement. Overall a severe cellulitis of the right leg. He is been using silver alginate to her original wound on the left leg. The wounds in his toes on the right are closed there is a small open area on the base of the left second toe 11/26/15; the patient's right leg is much better although there is still some edema here this could be reminiscent from his severe cellulitis likely on top of some degree of lymphedema. His left anterior leg wound has less surface slough as reported by her intake nurse. Small wound at the base of the left second toe 12/02/16; patient's right leg is better and there is no open wound here. His left anterior lateral leg wound continues to have a healthy-looking surface. Small wound at the base of the left second toe however there is erythema in the left forefoot which is worrisome 12/16/16; is no open wounds on his right leg. We took measurements for stockings. His left anterior lateral leg wound continues to have a healthy-looking surface. I'm not sure where we were with the Apligraf run through his insurance. We have been using Iodoflex. He has a thick eschar on the left first second toe interface, I suspect this may be fungal however there is no visible open 12/23/16; no open wound on his right leg. He has 2 small areas left of the linear wound that was remaining last week. We have been using Prisma, I thought I have disclosed this week, we can only look forward to next week 01/03/17; the patient had concerning areas of erythema last week, already on  doxycycline for UTI through his primary doctor. The erythema is absolutely no better there is warmth and swelling both medially from the left lateral leg wound and also the dorsal left foot. 01/06/17- Patient is here for follow-up evaluation of his left lateral leg ulcer and bilateral feet ulcers. He is on oral antibiotic therapy, tolerating that. Nursing staff and the  patient states that the erythema is improved from Monday. 01/13/17; the predominant left lateral leg wound continues to be problematic. I had put Apligraf on him earlier this month once. However he subsequently developed what appeared to be an intense cellulitis around the left lateral leg wound. I gave him Dalvance I think on 2/12 perhaps 2/13 he continues on cefdinir. The erythema is still present but the warmth and swelling is improved. I am hopeful that the cellulitis part of this control. I wouldn't be surprised if there is an element of venous inflammation as well. 01/17/17. The erythema is present but better in the left leg. His left lateral leg wound still does not have a viable surface buttons certain parts of this long thin wound it appears like there has been improvement in dimensions. 01/20/17; the erythema still present but much better in the left leg. I'm thinking this is his usual degree of chronic venous inflammation. The wound on the left leg looks somewhat better. Is less surface slough 01/27/17; erythema is back to the chronic venous inflammation. The wound on the left leg is somewhat better. I am back to the point where I like to try an Apligraf once again 02/10/17; slight improvement in wound dimensions. Apligraf #2. He is completing his doxycycline 02/14/17; patient arrives today having completed doxycycline last Thursday. This was supposed to be a nurse visit however once again he hasn't tense erythema from the medial part of his wound extending over the lower leg. Also erythema in his foot this is roughly in the same  distribution as last time. He has baseline chronic venous inflammation however this is a lot worse than the baseline I have learned to accept the on him is baseline inflammation 02/24/17- patient is here for follow-up evaluation. He is tolerating compression therapy. His voicing no complaints or concerns he is here anticipating an Apligraf 03/03/17; he arrives today with an adherent necrotic surface. I don't think this is surface is going to be amenable for Apligraf's. The erythema around his wound and on the left dorsal foot has resolved he is off antibiotics 03/10/17; better-looking surface today. I don't think he can tolerate Apligraf's. He tells me he had a wound VAC after a skin graft years ago to this area and they had difficulty with a seal. The erythema continues to be stable around this some degree of chronic venous inflammation but he also has recurrent cellulitis. We have been using Iodoflex 03/17/17; continued improvement in the surface and may be small changes in dimensions. Using Iodoflex which seems the only thing that will control his surface 03/24/17- He is here for follow up evaluation of his LLE lateral ulceration and ulcer to right dorsal foot/toe space. He is voicing no complaints or concerns, He is tolerating compression wrap. 03/31/17 arrives today with a much healthier looking wound on the left lower extremity. We have been using Iodoflex for a prolonged period of time which has for the first time prepared and adequate looking wound bed although we have not had much in the way of wound dimension improvement. He also has a small wound between the first and second toe on the right 04/07/17; arrives today with a healthy-looking wound bed and at least the top 50% of this wound appears to be now her. No debridement was required I have changed him to [facility] last week after prolonged Iodoflex. He did not do well with Apligraf's. We've had a re-opening between the first and second  toe on the  right 04/14/17; arrives today with a healthier looking wound bed contractions and the top 50% of this wound and some on the lesser 50%. Wound bed appears healthy. The area between the first and second toe on the right still remains problematic 04/21/17; continued very gradual improvement. Using [facility] 04/28/17; continued very gradual improvement in the left lateral leg venous insufficiency wound. His periwound erythema is very mild. We have been using Hydrofera Blue. Wound is making progress especially in the superior 50% 05/05/17; he continues to have very gradual improvement in the left lateral venous insufficiency wound. Both in terms with an length rings are improving. I debrided this every 2 weeks with #5 curet and we have been using Hydrofera Blue and again making good progress With regards to the wounds between his right first and second toe which I thought might of been tinea pedis he is not making as much progress very dry scaly skin over the area. Also the area at the base of the left first and second toe in a similar condition 05/12/17; continued gradual improvement in the refractory left lateral venous insufficiency wound on the left. Dimension smaller. Surface still requiring debridement using Hydrofera Blue 05/19/17; continued gradual improvement in the refractory left lateral venous ulceration. Careful inspection of the wound bed underlying rumination suggested some degree of epithelialization over the surface no debridement indicated. Continue Hydrofera Blue difficult areas between his toes first and third on the left than first and second on the right. I'm going to change to silver alginate from silver collagen. Continue ketoconazole as I suspect underlying tinea pedis 05/26/17; left lateral leg venous insufficiency wound. We've been using Hydrofera Blue. I believe that there is expanding epithelialization over the surface of the wound albeit not coming from the wound  circumference. This is a bit of an odd situation in which the epithelialization seems to be coming from the surface of the wound rather than in the exact circumference. There is still small open areas mostly along the lateral margin of the wound. ooHe has unchanged areas between the left first and second and the right first second toes which I been treating for tenia pedis 06/02/17; left lateral leg venous insufficiency wound. We have been using Hydrofera Blue. Somewhat smaller from the wound circumference. The surface of the wound remains a bit on it almost epithelialized sedation in appearance. I use an open curette today debridement in the surface of all of this especially the edges ooSmall open wounds remaining on the dorsal right first and second toe interspace and the plantar left first second toe and her face on the left 06/09/17; wound on the left lateral leg continues to be smaller but very gradual and very dry surface using Hydrofera Blue 06/16/17 requires weekly debridements now on the left lateral leg although this continues to contract. I changed to silver collagen last week because of dryness of the wound bed. Using Iodoflex to the areas on his first and second toes/web space bilaterally 06/24/17; patient with history of paraplegia also chronic venous insufficiency with lymphedema. Has a very difficult wound on the left lateral leg. This has been gradually reducing in terms of with but comes in with a very dry adherent surface. High switch to silver collagen a week or so ago with hydrogel to keep the area moist. This is been refractory to multiple dressing attempts. He also has areas in his first and second toes bilaterally in the anterior and posterior web space. I had been using Iodoflex here  after a prolonged course of silver alginate with ketoconazole was ineffective [question tinea pedis] 07/14/17; patient arrives today with a very difficult adherent material over his left lateral lower  leg wound. He also has surrounding erythema and poorly controlled edema. He was switched his Santyl last visit which the nurses are applying once during his doctor visit and once on a nurse visit. He was also reduced to 2 layer compression I'm not exactly sure of the issue here. 07/21/17; better surface today after 1 week of Iodoflex. Significant cellulitis that we treated last week also better. [Doxycycline] 07/28/17 better surface today with now 2 weeks of Iodoflex. Significant cellulitis treated with doxycycline. He has now completed the doxycycline and he is back to his usual degree of chronic venous inflammation/stasis dermatitis. He reminds me he has had ablations surgery here 08/04/17; continued improvement with Iodoflex to the left lateral leg wound in terms of the surface of the wound although the dimensions are better. 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 Problematic areas on the plantar aspect of the first second toe web space on the left and the dorsal aspect of the first second toe web space on the right. At one point I felt these were probably related to chronic fungal infections in treated him aggressively for this although we have not made any improvement here. 08/11/17; left lateral leg. Surface continues to improve with the Iodoflex although we are not seeing much improvement in overall wound dimensions. Areas on his plantar left foot and right foot show no improvement. In fact the right foot looks somewhat worse 08/18/17; left lateral leg. We changed to [facility] Blue last week after a prolonged course of Iodoflex which helps get the surface better. It appears that the wound with is improved. Continue with difficult areas on the left dorsal first second and plantar first second on the right 09/01/17; patient arrives in clinic today having had a temperature of 103 yesterday. He was seen in the ER and [facility]. The  patient was concerned he could have cellulitis again in the right leg however they diagnosed him with a UTI and he is now on Keflex. He has a history of cellulitis which is been recurrent and difficult but this is been in the left leg, in the past 5 use doxycycline. He does in and out catheterizations at home which are risk factors for UTI 09/08/17; patient will be completing his Keflex this weekend. The erythema on the left leg is considerably better. He has a new wound today on the medial part of the right leg small superficial almost looks like a skin tear. He has worsening of the area on the right dorsal first and second toe. His major area on the left lateral leg is better. Using Hydrofera Blue on all areas 09/15/17; gradual reduction in width on the long wound in the left lateral leg. No debridement required. He also has wounds on the plantar aspect of his left first second toe web space and on the dorsal aspect of the right first second toe web space. 09/22/17; there continues to be very gradual improvements in the dimensions of the left lateral leg wound. He hasn't round erythematous spot with might be pressure on his wheelchair. There is no evidence obviously of infection no purulence no warmth ooHe has a dry scaled area on the plantar aspect of the left first second toe ooImproved area on the dorsal right first second toe. 09/29/17;  left lateral leg wound continues to improve in dimensions mostly with an is still a fairly long but increasingly narrow wound. ooHe has a dry scaled area on the plantar aspect of his left first second toe web space ooIncreasingly concerning area on the dorsal right first second toe. In fact I am concerned today about possible cellulitis around this wound. The areas extending up his second toe and although there is deformities here almost appears to abut on the nailbed. 10/06/17; left lateral leg wound continues to make very gradual progress. Tissue culture I  did from the right first second toe dorsal foot last time grew MRSA and enterococcus which was vancomycin sensitive. This was not sensitive to clindamycin or doxycycline. He is allergic to Zyvox and sulfa we have therefore arrange for him to have dalvance infusion tomorrow. He is had this in the past and tolerated it well 10/20/17; left lateral leg wound continues to make decent progress. This is certainly reduced in terms of with there is advancing epithelialization.ooThe cellulitis in the right foot looks better although he still has a deep wound in the dorsal aspect of the first second toe web space. Plantar left first toe web space on the left I think is making some progress 10/27/17; left lateral leg wound continues to make decent progress. Advancing epithelialization.using Hydrofera Blue ooThe right first second toe web space wound is better-looking using silver alginate ooImprovement in the left plantar first second toe web space. Again using silver alginate 11/03/17 left lateral leg wound continues to make decent progress albeit slowly. Using Hydrofera Blue ooThe right per second toe web space continues to be a very problematic looking punched out wound. I obtained a piece of tissue for deep culture I did extensively treated this for fungus. It is difficult to imagine that this is a pressure area as the patient states other than going outside he doesn't really wear shoes at home ooThe left plantar first second toe web space looked fairly senescent. Necrotic edges. This required debridement oochange to Hydrofera Blue to all wound areas 11/10/17; left lateral leg wound continues to contract. Using Hydrofera Blue ooOn the right dorsal first second toe web space dorsally. Culture I did of this area last week grew MRSA there is not an easy oral option in this patient was multiple antibiotic allergies or intolerances. This was only a rare culture isolate I'm therefore going to use Bactroban  under silver alginate ooOn the left plantar first second toe web space. Debridement is required here. This is also unchanged 11/17/17; left lateral leg wound continues to contract using Hydrofera Blue this is no longer the major issue. ooThe major concern here is the right first second toe web space. He now has an open area going from dorsally to the plantar aspect. There is now wound on the inner lateral part of the first toe. Not a very viable surface on this. There is erythema spreading medially into the forefoot. ooNo major change in the left first second toe plantar wound 11/24/17; left lateral leg wound continues to contract using Hydrofera Blue. Nice improvement today ooThe right first second toe web space all of this looks a lot less angry than last week. I have given him clindamycin and topical Bactroban for MRSA and terbinafine for the possibility of underlining tinea pedis that I could not control with ketoconazole. Looks somewhat better ooThe area on the plantar left first second toe web space is weeping with dried debris around the wound 12/01/17; left lateral leg  wound continues to contract he [employer]. It is becoming thinner in terms of with nevertheless it is making good improvement. ooThe right first second toe web space looks less angry but still a large necrotic-looking wounds starting on the plantar aspect of the right foot extending between the toes and now extensively on the base of the right second toe. I gave him clindamycin and topical Bactroban for MRSA anterior benefiting for the possibility of underlying tinea pedis. Not looking better today ooThe area on the left first/second toe looks better. Debrided of necrotic debris 12/05/17* the patient was worked in urgently today because over the weekend he found blood on his incontinence bad when he woke up. He was found to have an ulcer by his wife who does most of his wound care. He came in today for [place] to look at  this. He has not had a history of wounds in his buttocks in spite of his paraplegia. 12/08/17; seen in follow-up today at his usual appointment. He was seen earlier this week and found to have a new wound on his buttock. We also follow him for wounds on the left lateral leg, left first second toe web space and right first second toe web space 12/15/17; we have been using Hydrofera Blue to the left lateral leg which has improved. The right first second toe web space has also improved. Left first second toe web space plantar aspect looks stable. The left buttock has worsened using Santyl. Apparently the buttock has drainage 12/22/17; we have been using Hydrofera Blue to the left lateral leg which continues to improve now 2 small wounds separated by normal skin. He tells [place] he had a fever up to 100 yesterday he is prone to UTIs but has not noted anything different. He does in and out catheterizations. The area between the first and second toes today does not look good necrotic surface covered with what looks to be purulent drainage and erythema extending into the third toe. I had gotten this to something that I thought look better last time however it is not look good today. He also has a necrotic surface over the buttock wound which is expanded. I thought there might be infection under here so I removed a lot of the surface with a #5 curet though nothing look like it really needed culturing. He is been using Santyl to this area 12/27/17; his original wound on the left lateral leg continues to improve using Hydrofera Blue. I gave him samples of Baxdella although he was unable to take them out of fear for an allergic reaction ["lump in his throat"].the culture I did of the purulent drainage from his second toe last week showed both enterococcus and a set Enterobacter I was also concerned about the erythema on the bottom of his foot although paradoxically although this looks somewhat better today. Finally his  pressure ulcer on the left buttock looks worse this is clearly now a stage III wound necrotic surface requiring debridement. We've been using silver alginate here. They came up today that he sleeps in a recliner, I'm not sure why but I asked him to stop this 01/03/18; his original wound we've been using Hydrofera Blue is now separated into 2 areas. ooUlcer on his left buttock is better he is off the recliner and sleeping in bed ooFinally both wound areas between his first and second toes also looks some better 01/10/18; his original wound on the left lateral leg is now separated into 2 wounds  we've been using Hydrofera Blue ooUlcer on his left buttock has some drainage. There is a small probing site going into muscle layer superiorly.using silver alginate -He arrives today with a deep tissue injury on the left heel ooThe wound on the dorsal aspect of his first second toe on the left looks a lot betterusing silver alginate ketoconazole ooThe area on the first second toe web space on the right also looks a lot bette 01/17/18; his original wound on the left lateral leg continues to progress using Hydrofera Blue ooUlcer on his left buttock also is smaller surface healthier except for a small probing site going into the muscle layer superiorly. 2.4 cm of tunneling in this area ooDTI on his left heel we have only been offloading. Looks better than last week no threatened open no evidence of infection oothe wound on the dorsal aspect of the first second toe on the left continues to look like it's regressing we have only been using silver alginate and terbinafine orally ooThe area in the first second toe web space on the right also looks to be a lot better using silver alginate and terbinafine I think this was prompted by tinea pedis 01/31/18; the patient was hospitalized in [place] last week apparently for a complicated UTI. He was discharged on cefepime he does in and out catheterizations. In the  hospital he was discovered M I don't mild elevation of AST and ALT and the terbinafine was stopped.predictably the pressure ulcer on s his buttock looks betterusing silver alginate. The area on the left lateral leg also is better using Hydrofera Blue. The area between the first and second toes on the left better. First and second toes on the right still substantial but better. Finally the DTI on the left heel has held together and looks like it's resolving 02/07/18-he is here in follow-up evaluation for multiple ulcerations. He has new injury to the lateral aspect of the last issue a pressure ulcer, he states this is from adhesive removal trauma. He states he has tried multiple adhesive products with no success. All other ulcers appear stable. The left heel DTI is resolving. We will continue with same treatment plan and follow-up next week. 02/14/18; follow-up for multiple areas. ooHe has a new area last week on the lateral aspect of his pressure ulcer more over the posterior trochanter. The original pressure ulcer looks quite stable has healthy granulation. 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 Almost fully epithelialized on the original superior area using Hydrofera Blue ooDTI on the left heel has peeled off this week to reveal a small superficial wound under denuded skin and subcutaneous tissue ooBoth areas between the first and second toes look better including nothing open on the left 02/21/18; ooThe patient's wounds on his left ischial tuberosity and posterior left greater trochanter actually looked better. He has a large area of irritation around the area which I think is contact dermatitis. I am doubtful that this is fungal ooHis original wound on the left lateral calf continues to improve we have been using Hydrofera Blue ooThere is no open area in the left first second toe web  space although there is a lot of thick callus ooThe DTI on the left heel required debridement today of necrotic surface eschar and subcutaneous tissue using silver alginate ooFinally the area on the right first second toe webspace continues to contract using silver alginate and ketoconazole  02/28/18 ooLeft ischial tuberosity wounds look better using silver alginate. ooOriginal wound on the left calf only has one small open area left using Hydrofera Blue ooDTI on the left heel required debridement mostly removing skin from around this wound surface. Using silver alginate ooThe areas on the right first/second toe web space using silver alginate and ketoconazole 03/08/18 on evaluation today patient appears to be doing decently well as best I can tell in regard to his wounds. This is the first time that I have seen him as he generally is followed by Dr. Dellia Nims. With that being said none of his wounds appear to be infected he does have an area where there is some skin covering what appears to be a new wound on the left dorsal surface of his great toe. This is right at the nail bed. With that being said I do believe that debrided away some of the excess skin can be of benefit in this regard. Otherwise he has been tolerating the dressing changes without complication. 03/14/18; patient arrives today with the multiplicity of wounds that we are following. He has not been systemically unwell ooOriginal wound on the left lateral calf now only has 2 small open areas we've been using Hydrofera Blue which should continue ooThe deep tissue injury on the left heel requires debridement today. We've been using silver alginate ooThe left first second toe and the right first second toe are both are reminiscence what I think was tinea pedis. Apparently some of the callus Surface between the toes was removed last week when it started draining. ooPurulent drainage coming from the wound on the ischial tuberosity on  the left. 03/21/18-He is here in follow-up evaluation for multiple wounds. 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 He tolerated debridement. The only change to last week's recommendations is to discontinue antifungal cream between toes. He will follow-up next week 03/28/18; following up for multiple wounds;Concern this week is streaking redness and swelling in the right foot. He is going to need antibiotics for this. 03/31/18; follow-up for right foot cellulitis. Streaking redness and swelling in the right foot on 03/28/18. 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 I put him on clindamycin 300 mg every 6 and brought him in for a quick check. He has an open wound between his first and second toes on the right foot as a potential source. 04/04/18; ooRight foot cellulitis is resolving he is completing clindamycin. This is truly good news ooLeft lateral calf wound which is initial wound only has one small open area inferiorly this is close to healing out. He has compression stockings. We will use Hydrofera Blue right down to the epithelialization of this ooNonviable surface on the left heel which was initially pressure with a DTI. We've been using Hydrofera Blue. I'm going to switch this back to silver alginate ooLeft first second toe/tinea pedis this looks better using silver alginate ooRight first second toe tinea pedis using silver alginate ooLarge pressure ulcers on theLeft ischial tuberosity. Small wound here Looks better. I am uncertain about the surface over the large wound. Using silver alginate 04/11/18; ooCellulitis in the right foot is resolved ooLeft lateral calf wound which was his original wounds still has 2 tiny open areas remaining this is just about closed ooNonviable surface on the left heel is better but still requires debridement ooLeft first second toe/tinea pedis still open  using silver alginate ooRight first second toe wound tinea  pedis I asked him to go back to using ketoconazole and silver alginate ooLarge pressure ulcers on the left ischial tuberosity this shear injury here is resolved. Wound is smaller. No evidence of infection using silver alginate 04/18/18; ooPatient arrives with an intense area of cellulitis in the right mid lower calf extending into the right heel area. Bright red and warm. Smaller area on the left anterior leg. He has a significant history of MRSA. He will definitely need antibioticsoodoxycycline ooHe now has 2 open areas on the left ischial tuberosity the original large wound and now a satellite area which I think was above his initial satellite areas. Not a wonderful surface on this satellite area surrounding erythema which looks like pressure related. ooHis left lateral calf wound again his original wound is just about closed ooLeft heel pressure injury still requiring debridement ooLeft first second toe looks a lot better using silver alginate ooRight first second toe also using silver alginate and ketoconazole cream also looks better 04/20/18; the patient was worked in early today out of concerns with his cellulitis on the right leg. I had started him on doxycycline. This was 2 days ago. His wife was concerned about the swelling in the area. Also concerned about the left buttock. He has not been systemically unwell no fever chills. No nausea vomiting or diarrhea 04/25/18; the patient's left buttock wound is continued to deteriorate he is using Hydrofera Blue. He is still completing clindamycin for the cellulitis on the right leg although all of this looks better. 05/02/18 ooLeft buttock wound still with a lot of drainage and a very tightly adherent fibrinous necrotic surface. He has a deeper area superiorly ooThe left lateral calf wound is still closed ooDTI wound on the left heel necrotic surface especially the circumference  using Iodoflex ooAreas between his left first second toe and right first second toe both look better. Dorsally and the right first second toe he had a necrotic surface although at smaller. In using silver alginate and ketoconazole. I did a culture last week which was a deep tissue culture of the reminiscence of the open wound on the right first second toe dorsally. This grew a few Acinetobacter and a few methicillin-resistant staph aureus. Nevertheless the area actually this week looked better. I didn't feel the need to specifically address this at least in terms of systemic antibiotics. 05/09/18; wounds are measuring larger more drainage per our intake. We are using Santyl covered with alginate on the large superficial buttock wounds, Iodosorb on the left heel, ketoconazole and silver alginate to the dorsal first and second toes bilaterally. 05/16/18; ooThe area on his left buttock better in some aspects although the area superiorly over the ischial tuberosity required an extensive debridement.using Santyl ooLeft heel appears stable. Using Iodoflex ooThe areas between his first and second toes are not bad however there is spreading erythema up the dorsal aspect of his left foot this looks like cellulitis again. He is insensate the erythema is really very brilliant.o Erysipelas He went to see an allergist days ago because he was itching part of this he had lab work done. This showed a white count of 15.1 with 70% neutrophils. Hemoglobin of 11.4 and a platelet count of 659,000. Last white count we had in Epic was a 2-1/2 years ago which was 25.9 but he was ill at the time. He was able to show me some lab work that was done by his primary physician the pattern is about the same. 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 But prompted me to go ahead and do x-rays of both feet and the pelvis rule out osteomyelitis. He also had a comprehensive metabolic  panel this was reasonably normal his albumin was 3.7 liver function tests BUN/creatinine all normal 05/23/18; x-rays of both his feet from last week were negative for underlying pulmonary abnormality. The x-ray of his pelvis however showed mild irregularity in the left ischial which may represent some early osteomyelitis. The wound in the left ischial continues to get deeper clearly now exposed muscle. Each week necrotic surface material over this area. Whereas the rest of the wounds do not look so bad. ooThe left ischial wound we have been using Santyl and calcium alginate ooT the left heel surface necrotic debris using Iodoflex o ooThe left lateral leg is still healed ooAreas on the left dorsal foot and the right dorsal foot are about the same. There is some inflammation on the left which might represent contact dermatitis, fungal dermatitis I am doubtful cellulitis although this looks better than last week 05/30/18; CT scan done at Hospital did not show any osteomyelitis or abscess. Suggested the possibility of underlying cellulitis although I don't see a lot of evidence of this at the bedside ooThe wound itself on the left buttock/upper thigh actually looks somewhat better. No debridement ooLeft heel also looks better no debridement continue Iodoflex ooBoth dorsal first second toe spaces appear better using Lotrisone. Left still required debridement 06/06/18; ooIntake reported some purulent looking drainage from the left gluteal wound. Using Santyl and calcium alginate ooLeft heel looks better although still a nonviable surface requiring debridement ooThe left dorsal foot first/second webspace actually expanding and somewhat deeper. I may consider doing a shave biopsy of this area ooRight dorsal foot first/second webspace appears stable to improved. Using Lotrisone and silver alginate to both these areas 06/13/18 ooLeft gluteal surface looks better. Now separated in the 2 wounds. No  debridement required. Still drainage. We'll continue silver alginate ooLeft heel continues to look better with Iodoflex continue this for at least another week ooOf his dorsal foot wounds the area on the left still has some depth although it looks better than last week. We've been using Lotrisone and silver alginate 06/20/18 ooLeft gluteal continues to look better healthy tissue ooLeft heel continues to look better healthy granulation wound is smaller. He is using Iodoflex and his long as this continues continue the Iodoflex ooDorsal right foot looks better unfortunately dorsal left foot does not. There is swelling and erythema of his forefoot. He had minor trauma to this several days ago but doesn't think this was enough to have caused any tissue injury. Foot looks like cellulitis, we have had this problem before 06/27/18 on evaluation today patient appears to be doing a little worse in regard to his foot ulcer. 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 Both of which would really be his only options for treating this infection. In the past he has been given and effusion of Orbactiv. This is done very well for him in the past again it's one time dosing IV antibiotic therapy. Subsequently I do believe this is something we're gonna need to see about doing at this point in time. Currently his other wounds seem to be doing somewhat better in my pinion I'm pretty happy in  that regard. 07/03/18 on evaluation today patient's wounds actually appear to be doing fairly well. He has been tolerating the dressing changes without complication. All in all he seems to be showing signs of improvement. In regard to the antibiotics he has been dealing with infectious disease since I saw him last week as far as getting this scheduled. In the end [facility] this coming Friday. In the meantime he has been continuing to perform the dressing changes in such as previous. There does not appear to be any evidence of infection worsengin at this time. 07/10/18; ooSince I last saw this man 2 weeks ago things have actually improved. IV antibiotics of resulted in less forefoot erythema although there is still some present. He is not systemically unwell ooLeft buttock wounds o2 now have no depth there is increased epithelialization Using silver alginate ooLeft heel still requires debridement using Iodoflex ooLeft dorsal foot still with a sizable wound about the size of a border but healthy granulation ooRight dorsal foot still with a slitlike area using silver alginate 07/18/18; the patient's cellulitis in the left foot is improved in fact I think it is on its way to resolving. ooLeft buttock wounds o2 both look better although the larger one has hypertension granulation we've been using silver alginate ooLeft heel has some thick circumferential redundant skin over the wound edge which will need to be removed today we've been using Iodoflex ooLeft dorsal foot is still a sizable wound required debridement using silver alginate ooThe right dorsal foot is just about closed only a small open area remains here 07/25/18; left foot cellulitis is resolved ooLeft buttock wounds o2 both look better. Hyper-granulation on the major area ooLeft heel as some debris over the surface but otherwise looks a healthier wound. Using silver collagen ooRight dorsal foot is just about closed 07/31/18; arrives with our intake nurse worried about purulent drainage from the buttock. We had hyper-granulation here last week ooHis buttock wounds o2 continue to look better ooLeft heel some debris over the surface but measuring smaller. ooRight dorsal foot unfortunately has openings between the toes ooLeft foot superficial wound looks  less aggravated. 08/07/18 ooButtock wounds continue to look better although some of her granulation and the larger medial wound. silver alginate ooLeft heel continues to look a lot better.silver collagen ooLeft foot superficial wound looks less stable. Requires debridement. He has a new wound superficial area on the foot on the lateral dorsal foot. ooRight foot looks better using silver alginate without Lotrisone 08/14/2018; patient was in the ER last week diagnosed with a UTI. He is now on Cefpodoxime and Macrodantin. ooButtock wounds continued to be smaller. Using silver alginate ooLeft heel continues to look better using silver collagen ooLeft foot superficial wound looks as though it is improving ooRight dorsal foot area is just about healed. 08/21/2018; patient is completed his antibiotics for his UTI. ooHe has 2 open areas on the buttocks. There is still not closed although the surface looks satisfactory. Using silver alginate ooLeft heel continues to improve using silver collagen ooThe bilateral dorsal foot areas which are at the base of his first and second toes/possible tinea pedis are actually stable on the left but worse on the right. The area on the left required debridement of necrotic surface. After debridement I obtained a specimen for PCR culture. ooThe right dorsal foot which is been just about healed last week is now reopened 08/28/2018; culture  done on the left dorsal foot showed coag negative staph both staph epidermidis and Lugdunensis. I think this is worthwhile initiating systemic treatment. I will use doxycycline given his long list of allergies. The area on the left heel slightly improved but still requiring debridement. ooThe large wound on the buttock is just about closed whereas the smaller one is larger. Using silver alginate in this area 09/04/2018; patient is completing his doxycycline for the left foot although this continues to be a very difficult wound  area with very adherent necrotic debris. We are using silver alginate to all his wounds right foot left foot and the small wounds on his buttock, silver collagen on the left heel. 09/11/2018; once again this patient has intense erythema and swelling of the left forefoot. Lesser degrees of erythema in the right foot. He has a long list of allergies and intolerances. I will reinstitute doxycycline. 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 Using silver alginate ooLeft heel also looks better using silver collagen ooUnfortunately both the areas on his feet look worse. The area on the left first second webspace is now gone through to the plantar part of his foot. The area on the left foot anteriorly is irritated with erythema and swelling in the forefoot. 09/25/2018 ooHis wound on the left plantar heel looks better. Using silver collagen ooThe area on the left buttock 2 small remnant areas. One is closed one is still open. Using silver alginate ooThe areas between both his first and second toes look worse. This in spite of long-standing antifungal therapy with ketoconazole and silver alginate which should have antifungal activity ooHe has small areas around his original wound on the left calf one is on the bottom of the original scar tissue and one superiorly both of these are small and superficial but again given wound history in this site this is worrisome 10/02/2018 ooLeft plantar heel continues to gradually contract using silver collagen ooLeft buttock wound is unchanged using silver alginate ooThe areas on his dorsal feet between his first and second toes bilaterally look about the same. 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 Small open area here. We have  been using silver alginate 10/09/2018; ooLeft plantar heel continues to look healthy and contract. No debridement is required ooLeft buttock slightly smaller with a tape injury wound just below which was new this week ooDorsal feet somewhat improved I have been using clindamycin ooLeft lateral looks lower extremity the actual open area looks worse although a lot of this is epithelialized. I am going to change to silver collagen today He has a lot more swelling in the right leg although this is not pitting not red and not particularly warm there is a lot of spasm in the right leg usually indicative of people with paralysis of some underlying discomfort. We have reviewed his vascular status from 2017 he had a left greater saphenous vein ablation. I wonder about referring him back to vascular surgery if the area on the left leg continues to deteriorate. 10/16/2018 in today for follow-up and management of multiple lower extremity ulcers. His left Buttock wound is much lower smaller and almost closed completely. The wound to the left ankle has began to reopen with Epithelialization and some adherent slough. He has multiple new areas  to the left foot and leg. The left dorsal foot without much improvement. Wound present between left great webspace and 2nd toe. Erythema and edema present right leg. Right LE ultrasound obtained on 10/10/18 was negative for DVT . 10/23/2018; ooLeft buttock is closed over. Still dry macerated skin but there is no open wound. I suspect this is chronic pressure/moisture ooLeft lateral calf is quite a bit worse than when I saw this last. There is clearly drainage here he has macerated skin into the left plantar heel. We will change the primary dressing to alginate ooLeft dorsal foot has some improvement in overall wound area. Still using clindamycin and silver alginate ooRight dorsal foot about the same as the left using clindamycin and silver alginate ooThe erythema  in the right leg has resolved. He is DVT rule out was negative ooLeft heel pressure area required debridement although the wound is smaller and the surface is health 10/26/2018 ooThe patient came back in for his nurse check today predominantly because of the drainage coming out of the left lateral leg with a recent reopening of his original wound on the left lateral calf. He comes in today with a large amount of surrounding erythema around the wound extending from the calf into the ankle and even in the area on the dorsal foot. He is not systemically unwell. He is not febrile. Nevertheless this looks like cellulitis. We have been using silver alginate to the area. I changed him to a regular visit and I am going to prescribe him doxycycline. The rationale here is a long list of medication intolerances and a history of MRSA. I did not see anything that I thought would provide a valuable culture 10/30/2018 ooFollow-up from his appointment 4 days ago with really an extensive area of cellulitis in the left calf left lateral ankle and left dorsal foot. I put him on doxycycline. He has a long list of medication allergies which are true allergy reactions. 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 In any case he is a lot better today. The erythema especially in the anterior and lateral left calf is better. He still has left ankle erythema. He also is complaining about increasing edema in the right leg we have only been using Kerlix Coban and he has been doing the wraps at home. Finally he has a spotty rash on the medial part of his upper left calf which looks like folliculitis or perhaps wrap occlusion type injury. Small superficial macules not pustules 11/06/18 patient arrives today with again a considerable degree of erythema around the wound on the left lateral calf extending into the dorsal ankle and dorsal foot. This is a lot worse  than when I saw this last week. He is on doxycycline really with not a lot of improvement. He has not been systemically unwell Wounds on the; left heel actually looks improved. Original area on the left foot and proximity to the first and second toes looks about the same. He has superficial areas on the dorsal foot, anterior calf and then the reopening of his original wound on the left lateral calf which looks about the same ooThe only area he has on the right is the dorsal webspace first and second which is smaller. ooHe has a large area of dry erythematous skin on the left buttock small open area here. 11/13/2018; the patient arrives in much better condition. The erythema around the wound on the left lateral  calf is a lot better. Not sure whether this was the clindamycin or the TCA and ketoconazole or just in the improvement in edema control [stasis dermatitis]. In any case this is a lot better. The area on the left heel is very small and just about resolved using silver collagen we have been using silver alginate to the areas on his dorsal feet 11/20/2018; his wounds include the left lateral calf, left heel, dorsal aspects of both feet just proximal to the first second webspace. He is stable to slightly improved. I did not think any changes to his dressings were going to be necessary 11/27/2018 he has a reopening on the left buttock which is surrounded by what looks like tinea or perhaps some other form of dermatitis. The area on the left dorsal foot has some erythema around it I have marked this area but I am not sure whether this is cellulitis or not. Left heel is not closed. Left calf the reopening is really slightly longer and probably worse 1/13; in general things look better and smaller except for the left dorsal foot. Area on the left heel is just about closed, left buttock looks better only a small wound remains in the skin looks better [using Lotrisone] 1/20; the area on the left heel only  has a few remaining open areas here. Left lateral calf about the same in terms of size, left dorsal foot slightly larger right lateral foot still not closed. The area on the left buttock has no open wound and the surrounding skin looks a lot better 1/27; the area on the left heel is closed. Left lateral calf better but still requiring extensive debridements. The area on his left buttock is closed. He still has the open areas on the left dorsal foot which is slightly smaller in the right foot which is slightly expanded. We have been using Iodoflex on these areas as well 2/3; left heel is closed. Left lateral calf still requiring debridement using Iodoflex there is no open area on his left buttock however he has dry scaly skin over a large area of this. Not really responding well to the Lotrisone. Finally the areas on his dorsal feet at the level of the first second webspace are slightly smaller on the right and about the same on the left. Both of these vigorously debrided with Anasept and gauze 2/10; left heel remains closed he has dry erythematous skin over the left buttock but there is no open wound here. Left lateral leg has come in and with. Still requiring debridement we have been using Iodoflex here. Finally the area on the left dorsal foot and right dorsal foot are really about the same extremely dry callused fissured areas. He does not yet have a dermatology appointment 2/17; left heel remains closed. He has a new open area on the left buttock. The area on the left lateral calf is bigger longer and still covered in necrotic debris. No major change in his foot areas bilaterally. I am awaiting for a dermatologist to look on this. We have been using ketoconazole I do not know that this is been doing any good at all. 2/24; left heel remains closed. The left buttock wound that was new reopening last week looks better. The left lateral calf appears better also although still requires debridement. The  major area on his foot is the left first second also requiring debridement. We have been putting Prisma on all wounds. I do not believe that the ketoconazole has done  too much good for his feet. He will use Lotrisone I am going to give him a 2-week course of terbinafine. We still do not have a dermatology appointment 3/2 left heel remains closed however there is skin over bone in this area I pointed this out to him today. The left buttock wound is epithelialized but still does not look completely stable. The area on the left leg required debridement were using silver collagen here. With regards to his feet we changed to Lotrisone last week and silver alginate. 3/9; left heel remains closed. Left buttock remains closed. The area on the right foot is essentially closed. The left foot remains unchanged. Slightly smaller on the left lateral calf. Using silver collagen to both of these areas 3/16-Left heel remains closed. Area on right foot is closed. Left lateral calf above the lateral malleolus open wound requiring debridement with easy bleeding. Left dorsal wound proximal to first toe also debrided. Left ischial area open new. Patient has been using Prisma with wrapping every 3 days. Dermatology appointment is apparently tomorrow.Patient has completed his terbinafine 2-week course with some apparent improvement according to him, there is still flaking and dry skin in his foot on the left 3/23; area on the right foot is reopened. The area on the left anterior foot is about the same still a very necrotic adherent surface. He still has the area on the left leg and reopening is on the left buttock. He apparently saw dermatology although I do not have a note. According to the patient who is usually fairly well informed they did not have any good ideas. Put him on oral terbinafine which she is been on before. 3/30; using silver collagen to all wounds. Apparently his dermatologist put him on doxycycline and  rifampin presumably some culture grew staph. I do not have this result. He remains on terbinafine although I have used terbinafine on him before 4/6; patient has had a fairly substantial reopening on the right foot between the first and second toes. He is finished his terbinafine and I believe is on doxycycline and rifampin still as prescribed by dermatology. We have been using silver collagen to all his wounds although the patient reports that he thinks silver alginate does better on the wounds on his buttock. 4/13; the area on his left lateral calf about the same size but it did not require debridement. ooLeft dorsal foot just proximal to the webspace between the first and second toes is about the same. Still nonviable surface. I note some superficial bronze discoloration of the dorsal part of his foot ooRight dorsal foot just proximal to the first and second toes also looks about the same. I still think there may be the same discoloration I noted above on the left ooLeft buttock wound looks about the same 4/20; left lateral calf appears to be gradually contracting using silver collagen. 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 The left dorsal foot wound is debrided of tightly adherent necrotic debris and really cleans up quite nicely. The right area is worse with expansion. I did not debride this it is now over the base of the second toe ooThe area on his left buttock is smaller no debridement is required using silver collagen 5/4; left calf continues to make good progress. ooHe arrives with erythema around the wounds on his dorsal foot which even extends to the plantar aspect. Very concerning for coexistent infection. He is finished the erythromycin I gave him for  possible erythrasma this does not seem to have helped. ooThe area on the left foot is about the same base of the dorsal toes ooIs area on the buttock looks improved  on the left 5/11; left calf and left buttock continued to make good progress. Left foot is about the same to slightly improved. ooMajor problem is on the right foot. He has not had an x-ray. Deep tissue culture I did last week showed both Enterobacter and E. coli. I did not change the doxycycline I put him on empirically although neither 1 of these were plated to doxycycline. He arrives today with the erythema looking worse on both the dorsal and plantar foot. Macerated skin on the bottom of the foot. he has not been systemically unwell 5/18-Patient returns at 1 week, left calf wound appears to be making some progress, left buttock wound appears slightly worse than last time, left foot wound looks slightly better, right foot redness is marginally better. X-ray of both feet show no air or evidence of osteomyelitis. Patient is finished his Omnicef and terbinafine. He continues to have macerated skin on the bottom of the left foot as well as right 5/26; left calf wound is better, left buttock wound appears to have multiple small superficial open areas with surrounding macerated skin. X-rays that I did last time showed no evidence of osteomyelitis in either foot. He is finished cefdinir and doxycycline. I do not think that he was on terbinafine. He continues to have a large superficial open area on the right foot anterior dorsal and slightly between the first and second toes. I did send him to dermatology 2 months ago or so wondering about whether they would do a fungal scraping. I do not believe they did but did do a culture. We have been using silver alginate to the toe areas, he has been using antifungals at home topically either ketoconazole or Lotrisone. We are using silver collagen on the left foot, silver alginate on the right, silver collagen on the left lateral leg and silver alginate on the left buttock 6/1; left buttock area is healed. We have the left dorsal foot, left lateral leg and right  dorsal foot. We are using silver alginate to the areas on both feet and silver collagen to the area on his left lateral calf 6/8; the left buttock apparently reopened late last week. He is not really sure how this happened. He is tolerating the terbinafine. Using silver alginate to all wounds 6/15; left buttock wound is larger than last week but still superficial. ooCame in the clinic today with a report of purulence from the left lateral leg I did not identify any infection ooBoth areas on his dorsal feet appear to be better. He is tolerating the terbinafine. Using silver alginate to all wounds 6/22; left buttock is about the same this week, left calf quite a bit better. His left foot is about the same however he comes in with erythema and warmth in the right forefoot once again. Culture that I gave him in the beginning of May showed Enterobacter and E. coli. I gave him doxycycline and things seem to improve although neither 1 of these organisms was specifically plated. 6/29; left buttock is larger and dry this week. Left lateral calf looks to me to be improved. Left dorsal foot also somewhat improved right foot completely unchanged. The erythema on the right foot is still present. He is completing the Ceftin dinner that I gave him empirically [see discussion above.) 7/6 -  All wounds look to be stable and perhaps improved, the left buttock wound is slightly smaller, per patient bleeds easily, completed ceftin, the right foot redness is less, he is on terbinafine 7/13; left buttock wound about the same perhaps slightly narrower. Area on the left lateral leg continues to narrow. Left dorsal foot slightly smaller right foot about the same. We are using silver alginate on the right foot and Hydrofera Blue to the areas on the left. Unna boot on the left 2 layer compression on the right 7/20; left buttock wound absolutely the same. Area on lateral leg continues to get better. Left dorsal foot require  debridement as did the right no major change in the 7/27; left buttock wound the same size necrotic debris over the surface. The area on the lateral leg is closed once again. His left foot looks better right foot about the same although there is some involvement now of the posterior first second toe area. He is still on terbinafine which I have given him for a month, not certain a centimeter major change 06/25/19-All wounds appear to be slightly improved according to report, left buttock wound looks clean, both foot wounds have minimal to no debris the right dorsal foot has minimal slough. We are using Hydrofera Blue to the left and silver alginate to the right foot and ischial wound. 8/10-Wounds all appear to be around the same, the right forefoot distal part has some redness which was not there before, however the wound looks clean and small. Ischial wound looks about the same with no changes 8/17; his wound on the left lateral calf which was his original chronic venous insufficiency wound remains closed. Since I last saw him the areas on the left dorsal foot right dorsal foot generally appear better but require debridement. The area on his left initial tuberosity appears somewhat larger to me perhaps hyper granulated and bleeds very easily. We have been using Hydrofera Blue to the left dorsal foot and silver alginate to everything else 8/24; left lateral calf remains closed. The areas on his dorsal feet on the webspace of the first and second toes bilaterally both look better. The area on the left buttock which is the pressure ulcer stage II slightly smaller. I change the dressing to Hydrofera Blue to all areas 8/31; left lateral calf remains closed. The area on his dorsal feet bilaterally look better. Using Hydrofera Blue. Still requiring debridement on the left foot. No change in the left buttock pressure ulcers however 9/14; left lateral calf remains closed. Dorsal feet look quite a bit better  than 2 weeks ago. Flaking dry skin also a lot better with the ammonium lactate I gave him 2 weeks ago. The area on the left buttock is improved. He states that his Roho cushion developed a leak and he is getting a new one, in the interim he is offloading this vigorously 9/21; left calf remains closed. Left heel which was a possible DTI looks better this week. He had macerated tissue around the left dorsal foot right foot looks satisfactory and improved left buttock wound. I changed his dressings to his feet to silver alginate bilaterally. Continuing Hydrofera Blue on the left buttock. 9/28 left calf remains closed. Left heel did not develop anything [possible DTI] dry flaking skin on the left dorsal foot. Right foot looks satisfactory. Improved left buttock wound. We are using silver alginate on his feet Hydrofera Blue on the buttock. I have asked him to go back to the Lotrisone on  his feet including the wounds and surrounding areas 10/5; left calf remains closed. The areas on the left and right feet about the same. A lot of this is epithelialized however debris over the remaining open areas. He is using Lotrisone and silver alginate. The area on the left buttock using Hydrofera Blue 10/26. Patient has been out for 3 weeks secondary to Covid concerns. He tested negative but I think his wife tested positive. He comes in today with the left foot substantially worse, right foot about the same. Even more concerning he states that the area on his left buttock closed over but then reopened and is considerably deeper in one aspect than it was before [stage III wound] 11/2; left foot really about the same as last week. Quarter sized wound on the dorsal foot just proximal to the first second toes. Surrounding erythema with areas of denuded epithelium. This is not really much different looking. Did not look like cellulitis this time however. ooRight foot area about the same.. We have been using silver  alginate alginate on his toes ooLeft buttock still substantial irritated skin around the wound which I think looks somewhat better. We have been using Hydrofera Blue here. 11/9; left foot larger than last week and a very necrotic surface. Right foot I think is about the same perhaps slightly smaller. Debris around the circumference also addressed. Unfortunately on the left buttock there is been a decline. Satellite lesions below the major wound distally and now a an additional one posteriorly we have been using Hydrofera Blue but I think this is a pressure issue 11/16; left foot ulcer dorsally again a very adherent necrotic surface. Right foot is about the same. Not much change in the pressure ulcer on his left buttock. 11/30; left foot ulcer dorsally basically the same as when I saw him 2 weeks ago. Very adherent fibrinous debris on the wound surface. Patient reports a lot of drainage as well. 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 We have been using Iodoflex, patient changed back to alginate because of the drainage. Area on his right dorsal foot really looks benign with a healthier surface certainly a lot better than on the left. Left buttock wounds all improved using Hydrofera Blue 12/7; left dorsal foot again no improvement. Tightly adherent debris. PCR culture I did last week only showed likely skin contaminant. I have gone ahead and done a punch biopsy of this which is about the last thing in terms of investigations I can think to do. He has known venous insufficiency and venous hypertension and this could be the issue here. The area on the right foot is about the same left buttock slightly worse according to our intake nurse secondary to [facility] Blue sticking to the wound 12/14; biopsy of the left foot that I did last time showed changes that could be related to wound healing/chronic stasis dermatitis phenomenon no neoplasm. We have been  using silver alginate to both feet. I change the one on the left today to Sorbact and silver alginate to his other 2 wounds 12/28; the patient arrives with the following problems; ooMajor issue is the dorsal left foot which continues to be a larger deeper wound area. Still with a completely nonviable surface ooParadoxically the area mirror image on the right on the right dorsal foot appears to be getting better. ooHe had some loss of dry denuded skin from the lower part of his original wound on the left lateral  calf. Some of this area looked a little vulnerable and for this reason we put him in wrap that on this side this week ooThe area on his left buttock is larger. He still has the erythematous circular area which I think is a combination of pressure, sweat. This does not look like cellulitis or fungal dermatitis 11/26/2019; -Dorsal left foot large open wound with depth. Still debris over the surface. Using Sorbact ooThe area on the dorsal right foot paradoxically has closed over [facility] has a reopening on the left ankle laterally at the base of his original wound that extended up into the calf. This appears clean. ooThe left buttock wound is smaller but with very adherent necrotic debris over the surface. We have been using silver alginate here as well The patient had arterial studies done in 2017. He had biphasic waveforms at the dorsalis pedis and posterior tibial bilaterally. ABI in the left was 1.17. Digit waveforms were dampened. He has slight spasticity in the great toes I do not think a TBI would be possible 1/11; the patient comes in today with a sizable reopening between the first and second toes on the right. This is not exactly in the same location where we have been treating wounds previously. According to our intake nurse this was actually fairly deep but 0.6 cm. The area on the left dorsal foot looks about the same the surface is somewhat cleaner using Sorbact, his MRI is in 2  days. We have not managed yet to get arterial studies. The new reopening on the left lateral calf looks somewhat better using alginate. The left buttock wound is about the same using alginate 1/18; the patient had his ARTERIAL studies which were quite normal. ABI in the right at 1.13 with triphasic/biphasic waveforms on the left ABI 1.06 again with triphasic/biphasic waveforms. It would not have been possible to have done a toe brachial index because of spasticity. We have been using Sorbac to the left foot alginate to the rest of his wounds on the right foot left lateral calf and left buttock 1/25; arrives in clinic with erythema and swelling of the left forefoot worse over the first MTP area. This extends laterally dorsally and but also posteriorly. Still has an area on the left lateral part of the lower part of his calf wound it is eschared and clearly not closed. ooArea on the left buttock still with surrounding irritation and erythema. ooRight foot surface wound dorsally. The area between the right and first and second toes appears better. 2/1; ooThe left foot wound is about the same. Erythema slightly better I gave him a week of doxycycline empirically ooRight foot wound is more extensive extending between the toes to the plantar surface ooLeft lateral calf really no open surface on the inferior part of his original wound however the entire area still looks vulnerable ooAbsolutely no improvement in the left buttock wound required debridement. 2/8; the left foot is about the same. Erythema is slightly improved I gave him clindamycin last week. ooRight foot looks better he is using Lotrimin and silver alginate ooHe has a breakdown in the left lateral calf. Denuded epithelium which I have removed ooLeft buttock about the same were using Hydrofera Blue 2/15; left foot is about the same there is less surrounding erythema. Surface still has tightly adherent debris which I have debriding  however not making any progress ooRight foot has a substantial wound on the medial right second toe between the first and second webspace. ooStill an  open area on the left lateral calf distal area. ooButtock wound is about the same 2/22; left foot is about the same less surrounding erythema. Surface has adherent debris. Polymen Ag Right foot area significant wound between the first and second toes. We have been using silver alginate here Left lateral leg polymen Ag at the base of his original venous insufficiency wound ooLeft buttock some improvement here 3/1; ooRight foot is deteriorating in the first second toe webspace. Larger and more substantial. We have been using silver alginate. ooLeft dorsal foot about the same markedly adherent surface debris using PolyMem Ag ooLeft lateral calf surface debris using PolyMem AG ooLeft buttock is improved again using PolyMem Ag. ooHe is completing his terbinafine. The erythema in the foot seems better. He has been on this for 2 weeks 3/8; no improvement in any wound area in fact he has a small open area on the dorsal midfoot which is new this week. He has not gotten his foot x-rays yet 3/15; his x-rays were both negative for osteomyelitis of both feet. No major change in any of his wounds on the extremities however his buttock wounds are better. We have been using polymen on the buttocks, left lower leg. Iodoflex on the left foot and silver alginate on the right 3/22; arrives in clinic today with the 2 major issues are the improvement in the left dorsal foot wound which for once actually looks healthy with a nice healthy wound surface without debridement. Using Iodoflex here. Unfortunately on the left lateral calf which is in the distal part of his original wound he came to the clinic here for there was purulent drainage noted some increased breakdown scattered around the original area and a small area proximally. We we are using polymen here  will change to silver alginate today. His buttock wound on the left is better and I think the area on the right first second toe webspace is also improved 3/29; left dorsal foot looks better. Using Iodoflex. Left ankle culture from deterioration last time grew E. coli, Enterobacter and Enterococcus. I will give him a course of cefdinir although that will not cover Enterococcus. The area on the right foot in the webspace of the first and second toe lateral first toe looks better. The area on his buttock is about healed Vascular appointment is on April 21. This is to look at his venous system vis--vis continued breakdown of the wounds on the left including the left lateral leg and left dorsal foot he. He has had previous ablations on this side 4/5; the area between the right first and second toes lateral aspect of the first toe looks better. Dorsal aspect of the left first toe on the left foot also improved. Unfortunately the left lateral lower leg is larger and there is a second satellite wound superiorly. The usual superficial abrasions on the left buttock overall better but certainly not closed 4/12; the area between the right first and second toes is improved. Dorsal aspect of the left foot also slightly smaller with a vibrant healthy looking surface. No real change in the left lateral leg and the left buttock wound is healed He has an unaffordable co-pay for Apligraf. Appointment with vein and vascular with regards to the left leg venous part of the circulation is on 4/21 4/19; we continue to see improvement in all wound areas. Although this is minor. He has his vascular appointment on 4/21. The area on the left buttock has not reopened although right in the  center of this area the skin looks somewhat threatened 4/26; the left buttock is unfortunately reopened. In general his left dorsal foot has a healthy surface and looks somewhat smaller although it was not measured as such. The area between  his first and second toe webspace on the right as a small wound against the first toe. The patient saw vascular surgery. The real question I was asking was about the small saphenous vein on the left. He has previously ablated left greater saphenous vein. Nothing further was commented on on the left. Right greater saphenous vein without reflux at the saphenofemoral junction or proximal thigh there was no indication for ablation of the right greater saphenous vein duplex was negative for DVT bilaterally. They did not think there was anything from a vascular surgery point of view that could be offered. They ABIs within normal limits 5/3; only small open area on the left buttock. The area on the left lateral leg which was his original venous reflux is now 2 wounds both which look clean. We are using Iodoflex on the left dorsal foot which looks healthy and smaller. He is down to a very tiny area between the right first and second toes, using silver alginate 5/10; all of his wounds appear better. We have much better edema control in 4 layer compression on the left. This may be the factor that is allowing the left foot and left lateral calf to heal. He has external compression garments at home 04/14/20-All of his wounds are progressing well, the left forefoot is practically closed, left ischium appears to be about the same, right toe webspace is also smaller. The left lateral leg is about the same, continue using Hydrofera Blue to this, silver alginate to the ischium, Iodoflex to the toe space on the right 6/7; most of his wounds outside of the left buttock are doing well. The area on the left lateral calf and left dorsal foot are smaller. The area on the right foot in between the first and second toe webspace is barely visible although he still says there is some drainage here is the only reason I did not heal this out. ooUnfortunately the area on the left buttock almost looks like he has a skin tear from  tape. He has open wound and then a large flap of skin that we are trying to get adherence over an area just next to the remaining wound 6/21; 2 week follow-up. I believe is been here for nurse visits. Miraculously the area between his first and second toes on the left dorsal foot is closed over. Still open on the right first second web space. The left lateral calf has 2 open areas. Distally this is more superficial. The proximal area had a little more depth and required debridement of adherent necrotic material. His buttock wound is actually larger we have been using silver alginate here 6/28; the patient's area on the left foot remains closed. Still open wet area between the first and second toes on the right and also extending into the plantar aspect. We have been using silver alginate in this location. He has 2 areas on the left lower leg part of his original long wounds which I think are better. We have been using Hydrofera Blue here. Hydrofera Blue to the left buttock which is stable 7/12; left foot remains closed. Left ankle is closed. May be a small area between his right first and second toes the only truly open area is on the left buttock.  We have been using Hydrofera Blue here 7/19; patient arrives with marked deterioration especially in the left foot and ankle. We did not put him in a compression wrap on the left last week in fact he wore his juxta lite stockings on either side although he does not have an underlying stocking. He has a reopening on the left dorsal foot, left lateral ankle and a new area on the right dorsal ankle. More worrisome is the degree of erythema on the left foot extending on the lateral foot into the lateral lower leg on the left 7/26; the patient had erythema and drainage from the lateral left ankle last week. Culture of this grew MRSA resistant to doxycycline and clindamycin which are the 2 antibiotics we usually use with this patient who has multiple antibiotic  allergies including linezolid, trimethoprim sulfamethoxazole. I had give him an empiric doxycycline and he comes in the area certainly looks somewhat better although it is blotchy in his lower leg. He has not been systemically unwell. He has had areas on the left dorsal foot which is a reopening, chronic wounds on the left lateral ankle. Both of these I think are secondary to chronic venous insufficiency. The area between his first and second toes is closed as far as I can tell. He had a new wrap injury on the right dorsal ankle last week. Finally he has an area on the left buttock. We have been using silver alginate to everything except the left buttock we are using Hydrofera Blue 06/30/20-Patient returns at 1 week, has been given a sample dose pack of NUZYRA which is a tetracycline derivative [omadacycline], patient has completed those, we have been using silver alginate to almost all the wounds except the left ischium where we are using Hydrofera Blue all of them look better 8/16; since I last saw the patient he has been doing well. The area on the left buttock, left lateral ankle and left foot are all closed today. He has completed the [place] I gave him last time and tolerated this well. He still has open areas on the right dorsal ankle and in the right first second toe area which we are using silver alginate. 8/23; we put him in his bilateral external compression stockings last week as he did not have anything open on either leg except for concerning area between the right first and second toe. He comes in today with an area on the left dorsal foot slightly more proximal than the original wound, the left lateral foot but this is actually a continuation of the area he had on the left lateral ankle from last time. As well he is opened up on the left buttock again. 8/30; comes in today with things looking a lot better. The area on the left lower ankle has closed down as has the left foot but with eschar  in both areas. The area on the dorsal right ankle is also epithelialized. Very little remaining of the left buttock wound. We have been using silver alginate on all wound areas 9/13; the area in the first second toe webspace on the right has fully epithelialized. He still has some vulnerable epithelium on the right and the ankle and the dorsal foot. He notes weeping. He is using his juxta lite stocking. On the left again the left dorsal foot is closed left lateral ankle is closed. We went to the juxta lite stocking here as well. ooStill vulnerable in the left buttock although only 2 small open areas  remain here 9/27; 2-week follow-up. We did not look at his left leg but the patient says everything is closed. He is a bit disturbed by the amount of edema in his left foot he is using juxta lite stockings but asking about over the toes stockings which would be 30/40, will talk to him next time. According to him there is no open wound on either the left foot or the left ankle/calf He has an open area on the dorsal right calf which I initially point a wrap injury. He has superficial remaining wound on the left ischial tuberosity been using silver alginate although he says this sticks to the wound 10/5; we gave him 2-week follow-up but he called yesterday expressing some concerns about his right foot right ankle and the left buttock. He came in early. There is still no open areas on the left leg and that still in his juxta lite stocking 10/11; he only has 1 small area on the left buttock that remains measuring millimeters 1 mm. Still has the same irritated skin in this area. We recommended zinc oxide when this eventually closes and pressure relief is meticulously is he can do this. He still has an area on the dorsal part of his right first through third toes which is a bit irritated and still open and on the dorsal ankle near the crease of the ankle. We have been using silver alginate and using his own  stocking. He has nothing open on the left leg or foot 10/25; 2-week follow-up. Not nearly as good on the left buttock as I was hoping. For open areas with 5 looking threatened small. He has the erythematous irritated chronic skin in this area. oo1 area on the right dorsal ankle. He reports this area bleeds easily ooRight dorsal foot just proximal to the base of his toes ooWe have been using silver alginate. 11/8; 2-week follow-up. Left buttock is about the same although I do not think the wounds are in the same location we have been using silver alginate. I have asked him to use zinc oxide on the skin around the wounds. ooHe still has a small area on the right dorsal ankle he reports this bleeds easily ooRight dorsal foot just proximal to the base of the toes does not have anything open although the skin is very dry and scaly ooHe has a new opening on the nailbed of the left great toe. Nothing on the left ankle 11/29; 3-week follow-up. Left buttock has 2 open areas. And washing of these wounds today started bleeding easily. Suggesting very friable tissue. We have been using silver alginate. Right dorsal ankle which I thought was initially a wrap injury we have been using silver alginate. Nothing open between the toes that I can see. He states the area on the left dorsal toe nailbed healed after the last visit in 2 or 3 days 12/13; 3-week follow-up. His left buttock now has 3 open areas but the original 2 areas are smaller using polymen here. Surrounding skin looks better. The right dorsal ankle is closed. He has a small opening on the right dorsal foot at the level of the third toe. In general the skin looks better here. He is wearing his juxta lite stocking on the left leg says there is nothing open 11/24/2020; 3 weeks follow-up. His left buttock still has the 3 open areas. We have been using polymen but due to lack of response he changed to [facility] area. Surrounding skin is dry  erythematous and irritated looking. There is no evidence of infection either bacterial or fungal however there is loss of surface epithelium ooHe still has very dry skin in his foot causing irritation and erythema on the dorsal part of his toes. This is not responded to prolonged courses of antifungal simply looks dry and irritated 1/24; left buttock area still looks about the same he was unable to find the triad ointment that we had suggested. The area on the right lower leg just above the dorsal ankle has reopened and the areas on the right foot between the first second and second third toes and scaling on the bottom of the foot has been about the same for quite some time now. been using silver alginate to all wound areas 2/7; left buttock wound looked quite good although not much smaller in terms of surface area surrounding skin looks better. Only a few dry flaking areas on the right foot in between the first and second toes the skin generally looks better here [ammonium lactate]. Finally the area on the right dorsal ankle is closed 2/21; ooThere is no open area on the right foot even between the right first and second toe. Skin around this area dorsally and plantar aspects look better. ooHe has a reopening of the area on the right ankle just above the crease of the ankle dorsally. I continue to think that this is probably friction from spasms may be even this time with his stocking under the compression stockings. ooWounds on his left buttock look about the same there a couple of areas that have reopened. He has a total square area of loss of epithelialization. This does not look like infection it looks like a contact dermatitis but I just cannot determine to what 3/14; there is nothing on the right foot between the first and second toes this was carefully inspected under illumination. Some chronic irritation on the dorsal part of his foot from toes 1-3 at the base. Nothing really open here  substantially. Still has an area on the right foot/ankle that is actually larger and hyper granulated. His buttock area on the left is just about closed however he has chronic inflammation with loss of the surface epithelial layer 3/28; 2-week follow-up. In clinic today with a new wound on the left anterior mid tibia. Says this happened about 2 weeks ago. He is not really sure how wonders about the spasticity of his legs at night whether that could have caused this other than that he does not have a good idea. He has been using topical antibiotics and silver alginate. The area on his right dorsal ankle seems somewhat better. ooFinally everything on his left buttock is closed. 4/11; 2-week follow-up. All of his wounds are better except for the area over the ischium and left buttock which have opened up widely again. At least part of this is covered in necrotic fibrinous material another part had rolled nonviable skin. The area on the right ankle, left anterior mid tibia are both a lot better. He had no open wounds on either foot including the areas between the first and second toes 4/25; patient presents for 2-week follow-up. He states that the wounds are overall stable. He has no complaints today and states he is using Hydrofera Blue to open wounds. 5/9; have not seen this man in over a month. For my memory he has open areas on the left mid tibia and right ankle. T oday he has new open area on the right  dorsal foot which we have not had a problem with recently. He has the sustained area on the left buttock He is also changed his insurance at the beginning of the year [employer]. We will need prior authorizations for debridement 5/23; patient presents for 2-week follow-up. He has prior authorizations for debridement. He denies any issues in the past 2 weeks with his wound care. He has been using Hydrofera Blue to all the wounds. He does report a circular rash to the upper left leg that is new. He  denies acute signs of infection. 6/6; 2-week follow-up. The patient has open wounds on the left buttock which are worse than the last time I saw this about a month ago. He also has a new area to me on the left anterior mid tibia with some surrounding erythema. The area on the dorsal ankle on the right is closed but I think this will be a friction injury every time this area is exposed to either our wraps or his compression stockings caused by unrelenting spasms in this leg. 6/20; 2-week follow-up. oo The patient has open wounds on the left buttock which is about the same. Using [facility] here. - The left mid tibia has a static amount of surrounding erythema. Also a raised area in the center. We have been using Hydrofera Blue here. ooooFinally he has broken down in his dorsal right foot extending between the first and second toes and going to the base of the first and second toe webspace. I have previously assumed that this was severe venous hypertension 7/5; 2-week follow-up oo The left buttock wound actually looks better. We are using Hydrofera Blue. He has extensive skin irritation around this area and I have not really been able to get that any better. I have tried Lotrisone i.e. antifungals and steroids. More most recently we have just been using Coloplast really looks about the same. oo The left mid tibia which was new last week culture to have very resistant staph aureus. Not only methicillin-resistant but doxycycline resistant. The patient has a plethora of antibiotic allergies including sulfa, linezolid. I used topical bacitracin on this but he has not started this yet. oo In addition he has an expanding area of erythema with a wound on the dorsal right foot. I did a deep tissue culture of this area today 7/12; oo Left buttock area actually looks better surrounding skin also looks less irritated. oo Left mid tibia looks about the same. He is using bacitracin this is not  worse oo Right dorsal foot looks about the same as well. oo The left first toe also looks about the same 7/19; left buttock wound continues to improve in terms of open areas oo Left mid tibia is still concerning amount of swelling he is using bacitracin oo Dorsal left first toe somewhat smaller oo Right dorsal foot somewhat smaller 7/25; left buttock wound actually continues to improve oo Left mid tibia area has less swelling. I gave him all my samples of new Nuzyra. This seems to have helped although the wound is still open it. His abrasion closed by here oo Left dorsal great toe really no better. Still a very nonviable surface oo Right dorsal foot perhaps some better. oo We have been using bacitracin and silver nitrate to the areas on his lower legs and Hydrofera Blue to the area on the buttock. 8/16 oo Disappointed that his left buttock wound is actually more substantial. Apparently during the last nurse visit these were both  very small. He has continued irritation to a large area of skin on his buttock. I have never been able to totally explain this although I think it some combination of the way he sits, pressure, moisture. He is not incontinent enough to contribute to this. oo Left dorsal great toe still fibrinous debris on the surface that I have debrided today oo Large area across the dorsal right toes. oo The area on the left anterior mid tibia has less swelling. He completed the [place]. This does not look infected although the tissue is still fried 8/30; 2-week follow-up. oo Left buttock areas not improved. We used Hydrofera Blue on this. Weeping wet with the surrounding erythema that I have not been able to control even with Lotrisone and topical Coloplast oo Left dorsal great toe looks about the same oo More substantial area again at the base of his toes on the left which is new this week. oo Area across the dorsal right toes looks improved oo The left anterior  mid tibia looks like it is trying to close 9/13; 2-week follow-up. Using silver alginate on all of his wounds. The left dorsal foot does not look any better. He has the area on the dorsal toe and also the areas at the base of all of his toes 1 through 3. On the right foot he has a similar pattern in a similar area. He has the area on his left mid tibia that looks fairly healthy. Finally the area on his left buttock looks somewhat bette 9/20; culture I did of the left foot which was a deep tissue culture last time showed E. coli he has erythema around this wound. Still a completely necrotic surface. His right dorsal foot looks about the same. He has a very friable surface to the left anterior mid tibia. Both buttock wounds look better. We have been using silver alginate to all wounds 10/4; he has completed the cephalexin that I gave him last time for the left foot. He is using topical gentamicin under silver alginate silver alginate being applied to all the wounds. Unfortunately all the wounds look irritated on his dorsal right foot dorsal left foot left mid tibia. I wonder if this could be a silver allergy. I am going to change him to [facility] on the lower extremity. The skin on the left buttock and left posterior thigh still flaking dry and irritated. This has continued no matter what I have applied topically to this. He has a solitary open wound which by itself does not look too bad however the entire area of surrounding skin does not change no matter what we have applied here 10/18; the area on the left dorsal foot and right dorsal foot both look better. The area on the right extends into the plantar but not between his toes. We have been using silver alginate. He still has a rectangular erythematous area around the area on the left tibia. The wound itself is very small. Finally everything on his left buttock looks a little larger the skin is erythematous 11/15; patient comes in with the left  dorsal and right dorsal foot distally looking somewhat better. Still nonviable surface on the left foot which required debridement. He still has the area on the left anterior mid tibia although this looks somewhat better. He has a new area on the right lateral lower leg just above the ankle. Finally his left buttock looks terrible with multiple superficial open wounds geometric square shaped area of chronic erythema  which I have not been able to sort through 11/29; right dorsal foot and left dorsal foot both look somewhat better. No debridement required. He has the fragile area on the left anterior mid tibia this looks and continues to look somewhat better. Right lateral lower leg just above the ankle we identified last time also looks better. In general the area on his left buttock looks improved. We are using Hydrofera Blue to all wound areas 12/13; right dorsal foot looks better. The area on the right lateral leg is healed. Left dorsal foot has 2 open areas both of which require debridement. The fragile area on the left anterior mid tibial looks better. Smaller area on his buttocks. Were using Hydrofera Blue 1/10; patient comes in with everything looking slightly larger and/or worse. This includes his left buttock, reopening of the left mid tibia, larger areas on the left dorsal foot and what looks to be a cellulitis on the right dorsal and plantar foot. We have been using Hydrofera Blue on all wounds. 1/17; right dorsal foot distally looks better today. The left foot has 2 open wounds that are about the same surrounding erythema. 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 No malignancy they did not stain for fungus 1/24; his right distal foot is not closed dry and scaly but the wound looks like it is contracting. I did not debride anything here. Problem on his left  dorsal foot with expanding erythema. Apparently there were problems last week getting the Elesa Hacker however it is now available at the [facility] but a week later. He is using ketoconazole and Coloplast to the left buttock along with Hydrofera Blue this actually looks quite a bit better today. Objective Constitutional Sitting or standing Blood Pressure is within target range for patient.. Pulse regular and within target range for patient.Marland Kitchen Respirations regular, non-labored and within target range.. Temperature is normal and within the target range for the patient.Marland Kitchen Appears in no distress. Vitals Time Taken: 7:55 AM, Height: 70 in, Weight: 216 lbs, BMI: 31, Temperature: 98.1 F, Pulse: 120 bpm, Respiratory Rate: 17 breaths/min, Blood Pressure: 129/65 mmHg. General Notes: Wound exam; right foot continues to contract and is dry but no debridement of the wound. oo There is expanding erythema in the left foot I have marked this area. I have told the patient I do not see any other option other than the [place] perhaps a single injection of a long acting IV antibiotic. He will pick this up today the cost was $100 oo His left buttock looks quite a bit better. The skin is not nearly as erythematous. Wounds are about the same but the surrounding skin looks a lot better Integumentary (Hair, Skin) Wound #41R status is Open. Original cause of wound was Gradually Appeared. The date acquired was: 03/16/2020. The wound has been in treatment 91 weeks. The wound is located on the Left Ischium. The wound measures 7cm length x 4cm width x 0.1cm depth; 21.991cm^2 area and 2.199cm^3 volume. There is Fat Layer (Subcutaneous Tissue) exposed. There is no tunneling or undermining noted. There is a medium amount of sanguinous drainage noted. The wound margin is distinct with the outline attached to the wound base. There is large (67-100%) red, friable granulation within the wound bed. There is no necrotic tissue  within the wound bed. Wound #51R status is Open. Original cause of wound was Trauma. The date  acquired was: 01/26/2021. The wound has been in treatment 43 weeks. The wound is located on the Left,Anterior Lower Leg. The wound measures 0.2cm length x 0.2cm width x 0.1cm depth; 0.031cm^2 area and 0.003cm^3 volume. There is Fat Layer (Subcutaneous Tissue) exposed. There is no tunneling or undermining noted. There is a medium amount of serosanguineous drainage noted. The wound margin is flat and intact. There is large (67-100%) red granulation within the wound bed. There is no necrotic tissue within the wound bed. Wound #52 status is Open. Original cause of wound was Gradually Appeared. The date acquired was: 03/27/2021. The wound has been in treatment 37 weeks. The wound is located on the Right,Dorsal Foot. The wound measures 2cm length x 4cm width x 0.1cm depth; 6.283cm^2 area and 0.628cm^3 volume. There is Fat Layer (Subcutaneous Tissue) exposed. There is no tunneling or undermining noted. There is a medium amount of serosanguineous drainage noted. The wound margin is distinct with the outline attached to the wound base. There is small (1-33%) pink, pale granulation within the wound bed. There is a large (67- 100%) amount of necrotic tissue within the wound bed including Adherent Slough. Wound #56 status is Open. Original cause of wound was Gradually Appeared. The date acquired was: 07/11/2021. The wound has been in treatment 21 weeks. The wound is located on the Left,Dorsal Foot. The wound measures 4cm length x 2cm width x 0.2cm depth; 6.283cm^2 area and 1.257cm^3 volume. There is Fat Layer (Subcutaneous Tissue) exposed. There is no tunneling or undermining noted. There is a medium amount of serosanguineous drainage noted. The wound margin is distinct with the outline attached to the wound base. There is medium (34-66%) pink, pale granulation within the wound bed. There is a medium (34-66%) amount of  necrotic tissue within the wound bed including Adherent Slough. Wound #57 status is Open. Original cause of wound was Gradually Appeared. The date acquired was: 08/25/2021. The wound has been in treatment 16 weeks. The wound is located on the [place]. The wound measures 4cm length x 0.2cm width x 0.1cm depth; 0.628cm^2 area and 0.063cm^3 volume. There is Fat Layer (Subcutaneous Tissue) exposed. There is no tunneling or undermining noted. There is a medium amount of serosanguineous drainage noted. The wound margin is distinct with the outline attached to the wound base. There is medium (34-66%) pink granulation within the wound bed. There is a medium (34- 66%) amount of necrotic tissue within the wound bed including Adherent Slough. Wound #60 status is Open. Original cause of wound was Gradually Appeared. The date acquired was: 12/08/2021. The wound has been in treatment 1 weeks. The wound is located on the Right,Lateral Lower Leg. The wound measures 0.2cm length x 0.2cm width x 0.1cm depth; 0.031cm^2 area and 0.003cm^3 volume. There is Fat Layer (Subcutaneous Tissue) exposed. There is no tunneling or undermining noted. There is a medium amount of serosanguineous drainage noted. The wound margin is distinct with the outline attached to the wound base. There is large (67-100%) red granulation within the wound bed. There is no necrotic tissue within the wound bed. 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 - Dr. Dellia Nims Other: - Pt. to pick up [place] Prescription today from [place]. PHarmacy Bathing/ Shower/ Hygiene: May shower and wash wound with soap and water. - on days that dressing is changed Edema Control - Lymphedema / SCD / Other: Elevate legs to the level of the heart or above for 30 minutes daily and/or when sitting, a frequency of: - throughout the day Compression stocking or Garment 30-40 mm/Hg pressure to: - Juxtalite to both legs daily Off-Loading: Roho cushion for wheelchair Turn and reposition every 2 hours WOUND #41R: - Ischium Wound Laterality: Left Cleanser: Soap and Water Every Other Day/30 Days Discharge Instructions: May shower and wash wound with dial antibacterial soap and water prior to dressing change. Peri-Wound Care: Ketoconazole Cream 2% Every Other Day/30 Days Discharge Instructions: Apply Ketoconazole as directed Peri-Wound Care: Triad Hydrophilic Wound Dressing Tube, 6 (oz) Every Other Day/30 Days Discharge Instructions: Apply to periwound with each dressing change Prim Dressing: Hydrofera Blue Ready Foam, 2.5 x2.5 in (Generic) Every Other Day/30 Days ary Discharge Instructions: Apply to wound bed as instructed Secondary Dressing: ABD Pad, 5x9 Every Other Day/30 Days Discharge Instructions: Apply over primary dressing as directed. Secured With: 7M Medipore H Soft Cloth Surgical T 4 x 2 (in/yd) (Generic) Every Other Day/30 Days ape Discharge Instructions: Secure dressing with tape as directed. WOUND #51R: - Lower Leg Wound Laterality: Left, Anterior Cleanser: Soap and Water Every Other Day/30 Days Discharge Instructions: May shower and wash wound with dial antibacterial soap and water prior to dressing change. Prim Dressing: Hydrofera Blue Ready Foam, 2.5 x2.5 in (Generic) Every Other Day/30 Days ary Discharge Instructions: Apply to wound bed as  instructed Secondary Dressing: Zetuvit Plus Silicone Border Dressing 4x4 (in/in) Every Other Day/30 Days Discharge Instructions: Apply silicone border over primary dressing as directed. Secured With: 7M Medipore H Soft Cloth Surgical T 4 x 2 (in/yd) (Generic) Every Other Day/30 Days ape Discharge Instructions: Secure dressing with tape as directed. WOUND #52: - Foot Wound Laterality: Dorsal, Right Cleanser: Soap and Water Every Other Day/30 Days Discharge Instructions: May shower and wash wound with dial antibacterial soap and water prior to dressing change. Prim Dressing: Hydrofera Blue Ready Foam, 2.5 x2.5 in (Generic) Every Other Day/30 Days ary Discharge Instructions: Apply to wound bed as instructed Secondary Dressing: Woven Gauze Sponge, Non-Sterile 4x4 in Every Other Day/30 Days Discharge Instructions: Apply over primary dressing as directed. Secured With: [employer], 4.5x3.1 (in/yd) Every Other Day/30 Days Discharge Instructions: Secure with Kerlix as directed. Secured With: 7M Medipore H Soft Cloth Surgical T ape, 4 x 10 (in/yd) (Generic) Every Other Day/30 Days Discharge Instructions: Secure with tape as directed. WOUND #56: - Foot Wound Laterality: Dorsal, Left Cleanser: Soap and Water Every Other Day/30 Days Discharge Instructions: May shower and wash wound with dial antibacterial soap and water prior to dressing change. Topical: Mupirocin Ointment Every Other Day/30 Days Discharge Instructions: Apply Mupirocin (Bactroban) as instructed Prim Dressing: Hydrofera Blue Classic Foam, 4x4 in (Generic) Every Other Day/30 Days ary Discharge Instructions: Moisten with saline prior to applying to wound bed Secondary Dressing: Woven Gauze Sponge, Non-Sterile 4x4 in Every Other Day/30 Days Discharge Instructions: Apply over primary dressing as directed. Secured With: [employer], 4.5x3.1 (in/yd) Every Other Day/30 Days Discharge Instructions: Secure with Kerlix as  directed. Secured With: 7M Medipore H Soft Cloth Surgical T ape,  4 x 10 (in/yd) (Generic) Every Other Day/30 Days Discharge Instructions: Secure with tape as directed. WOUND #57: - Foot Wound Laterality: Plantar, Right Cleanser: Soap and Water Every Other Day/30 Days Discharge Instructions: May shower and wash wound with dial antibacterial soap and water prior to dressing change. Prim Dressing: Hydrofera Blue Ready Foam, 2.5 x2.5 in (Generic) Every Other Day/30 Days ary Discharge Instructions: Apply to wound bed as instructed Secondary Dressing: Woven Gauze Sponge, Non-Sterile 4x4 in Every Other Day/30 Days Discharge Instructions: Apply over primary dressing as directed. Secured With: [employer], 4.5x3.1 (in/yd) Every Other Day/30 Days Discharge Instructions: Secure with Kerlix as directed. Secured With: 12M Medipore H Soft Cloth Surgical T 4 x 2 (in/yd) (Generic) Every Other Day/30 Days ape Discharge Instructions: Secure dressing with tape as directed. WOUND #60: - Lower Leg Wound Laterality: Right, Lateral Cleanser: Soap and Water Every Other Day/30 Days Discharge Instructions: May shower and wash wound with dial antibacterial soap and water prior to dressing change. Prim Dressing: Hydrofera Blue Ready Foam, 2.5 x2.5 in (Generic) Every Other Day/30 Days ary Discharge Instructions: Apply to wound bed as instructed Secondary Dressing: Woven Gauze Sponge, Non-Sterile 4x4 in Every Other Day/30 Days Discharge Instructions: Apply over primary dressing as directed. Secured With: [employer], 4.5x3.1 (in/yd) Every Other Day/30 Days Discharge Instructions: Secure with Kerlix as directed. Secured With: 12M Medipore H Soft Cloth Surgical T 4 x 2 (in/yd) (Generic) Every Other Day/30 Days ape Discharge Instructions: Secure dressing with tape as directed. 1. I continued with the Hydrofera Blue to all wound areas 2. With regards to the dry skin on his dorsal feet bilaterally he has  been using bag balm I have suggested using an alternative skin moisturizer 3. I have urged him to go ahead with the Elesa Hacker that is waiting for him across the parking lot at the hospital pharmacy. Unfortunately I do not see any other good option. This will be for 8 days. I fortunately cost the $100 4. He is using ABD pads over the left buttock I think this is also probably help 5. I have marked the area of the infection in the left foot Electronic Signature(s) Signed: 12/15/2021 4:35:26 PM By: Linton Ham MD Entered By: Linton Ham on 12/15/2021 09:27:54 -------------------------------------------------------------------------------- SuperBill Details Patient Name: Date of Service: B[place], [name] 12/15/2021 Medical Record Number: [id number] Patient Account Number: [id number] Date of Birth/Sex: Treating RN: 28-Oct-1986 (36 y.o. [name]: [place], [place] Other Clinician: Referring Provider: Treating Provider/Extender: [name]: 310 Diagnosis Coding ICD-10 Codes Code Description I87.332 Chronic venous hypertension (idiopathic) with ulcer and inflammation of left lower extremity L97.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, stage 3 L97.821 Non-pressure chronic ulcer of other part of left lower leg limited to breakdown of skin L97.518 Non-pressure chronic ulcer of other part of right foot with other 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, 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 Non-pressure chronic ulcer of other part of left foot limited to breakdown of skin L03.115 Cellulitis of right lower limb Facility Procedures CPT4 Code: 10932355 Description: 73220 - WOUND CARE VISIT-LEV 5 EST PT Modifier: Quantity: 1 Physician Procedures : CPT4 Code Description Modifier [id number] - WC PHYS LEVEL 4 - EST PT ICD-10 Diagnosis Description L97.521 Non-pressure  chronic ulcer of other part of left foot limited to breakdown of skin L97.821 Non-pressure chronic ulcer of other part of left  lower leg  limited to breakdown of 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, stage 3 L03.115 Cellulitis of right lower limb Quantity: 1 Electronic Signature(s) Signed: 12/15/2021 4:35:26 PM By: Linton Ham MD Entered By: Linton Ham on 12/15/2021 32:95:18

## 2021-12-22 ENCOUNTER — Other Ambulatory Visit: Payer: Self-pay

## 2021-12-22 ENCOUNTER — Encounter (HOSPITAL_BASED_OUTPATIENT_CLINIC_OR_DEPARTMENT_OTHER): Payer: BC Managed Care – PPO | Admitting: Internal Medicine

## 2021-12-22 DIAGNOSIS — I872 Venous insufficiency (chronic) (peripheral): Secondary | ICD-10-CM | POA: Diagnosis not present

## 2021-12-22 DIAGNOSIS — L97511 Non-pressure chronic ulcer of other part of right foot limited to breakdown of skin: Secondary | ICD-10-CM | POA: Diagnosis not present

## 2021-12-22 DIAGNOSIS — I87332 Chronic venous hypertension (idiopathic) with ulcer and inflammation of left lower extremity: Secondary | ICD-10-CM | POA: Diagnosis not present

## 2021-12-22 DIAGNOSIS — L03115 Cellulitis of right lower limb: Secondary | ICD-10-CM | POA: Diagnosis not present

## 2021-12-22 DIAGNOSIS — L97521 Non-pressure chronic ulcer of other part of left foot limited to breakdown of skin: Secondary | ICD-10-CM | POA: Diagnosis not present

## 2021-12-22 DIAGNOSIS — G8221 Paraplegia, complete: Secondary | ICD-10-CM | POA: Diagnosis not present

## 2021-12-22 DIAGNOSIS — L89323 Pressure ulcer of left buttock, stage 3: Secondary | ICD-10-CM | POA: Diagnosis not present

## 2021-12-22 DIAGNOSIS — L97518 Non-pressure chronic ulcer of other part of right foot with other specified severity: Secondary | ICD-10-CM | POA: Diagnosis not present

## 2021-12-22 DIAGNOSIS — L89322 Pressure ulcer of left buttock, stage 2: Secondary | ICD-10-CM | POA: Diagnosis not present

## 2021-12-22 DIAGNOSIS — L97821 Non-pressure chronic ulcer of other part of left lower leg limited to breakdown of skin: Secondary | ICD-10-CM | POA: Diagnosis not present

## 2021-12-23 NOTE — Progress Notes (Signed)
[name] [name] ([id number]) Visit Report for 12/22/2021 Arrival Information Details Patient Name: Date of Service: [name] [name] [name] 12/22/2021 8:00 [name] M Medical Record Number: [id number] Patient Account Number: [id number] Date of Birth/Sex: Treating RN: [birth year] (36 y.o. [name] Primary Care Sharilyn [facility]: [name], [name] Other Clinician: Referring Libra Gatz: Treating Joss Mcdill/Extender: [name]: 57 Visit Information History Since Last Visit Added or deleted any medications: No Patient Arrived: Wheel Chair Any new allergies or adverse reactions: No Arrival Time: 08:04 Had [name] fall or experienced change in No Accompanied By: self activities of daily living that may affect Transfer Assistance: Manual risk of falls: Patient Identification Verified: Yes Signs or symptoms of abuse/neglect since last visito No Secondary Verification Process Completed: Yes Hospitalized since last visit: No Patient Requires Transmission-Based Precautions: No Implantable device outside of the clinic excluding No Patient Has Alerts: Yes cellular tissue based products placed in the center Patient Alerts: R ABI = 1.0 since last visit: L ABI = 1.1 Has Dressing in Place as Prescribed: Yes Pain Present Now: No Electronic Signature(s) Signed: 12/23/2021 4:37:24 PM By: Rhae Hammock RN Entered By: Rhae Hammock on 12/22/2021 08:04:44 -------------------------------------------------------------------------------- Encounter Discharge Information Details Patient Name: Date of Service: [name] [name] [name] 12/22/2021 8:00 [name] M Medical Record Number: [id number] Patient Account Number: [id number] Date of Birth/Sex: Treating RN: Aug 20, 1986 (36 y.o. [name] Primary Care Regnald Bowens: [place], [place] Other Clinician: Referring Darrelle Wiberg: Treating Felesia Stahlecker/Extender: [name]: 311 Encounter Discharge Information Items Discharge  Condition: Stable Ambulatory Status: Wheelchair Discharge Destination: Home Transportation: Private Auto Accompanied By: self Schedule Follow-up Appointment: Yes Clinical Summary of Care: Patient Declined Electronic Signature(s) Signed: 12/23/2021 4:37:24 PM By: Rhae Hammock RN Entered By: Rhae Hammock on 12/22/2021 08:44:32 -------------------------------------------------------------------------------- Lower Extremity Assessment Details Patient Name: Date of Service: [name] [name] [name] 12/22/2021 8:00 [name] M Medical Record Number: [id number] Patient Account Number: [id number] Date of Birth/Sex: Treating RN: 08/12/1986 (36 y.o. [name] Primary Care Dorance Spink: [place], [place] Other Clinician: Referring Abem Shaddix: Treating Madelon Welsch/Extender: [name]: 311 Edema Assessment Assessed: [Left: No] [Right: No] Edema: [Left: Yes] [Right: Yes] Calf Left: Right: Point of Measurement: 33 cm From Medial Instep 28.5 cm 33.6 cm Ankle Left: Right: Point of Measurement: 10 cm From Medial Instep 25.3 cm 24.5 cm Vascular Assessment Pulses: Dorsalis Pedis Palpable: [Left:Yes] [Right:Yes] Posterior Tibial Palpable: [Left:Yes] [Right:Yes] Electronic Signature(s) Signed: 12/23/2021 4:37:24 PM By: Rhae Hammock RN Entered By: Rhae Hammock on 12/22/2021 08:06:21 -------------------------------------------------------------------------------- Multi Wound Chart Details Patient Name: Date of Service: [name] [name] [name] 12/22/2021 8:00 [name] M Medical Record Number: [id number] Patient Account Number: [id number] Date of Birth/Sex: Treating RN: 1986/09/01 (37 y.o. [name] Primary Care Rithika Seel: [name], [name] Other Clinician: Referring Ulyses Panico: Treating Amirrah Quigley/Extender: Dutch Gray, [name]: 311 Vital Signs Height(in): 70 Pulse(bpm): 121 Weight(lbs): 216 Blood Pressure(mmHg): 130/74 Body Mass  Index(BMI): 31 Temperature(F): 98 Respiratory Rate(breaths/min): 57 Photos: [41R:Left Ischium] [51R:Left, Anterior Lower Leg] [52:Right, Dorsal Foot] Wound Location: [41R:Gradually Appeared] [51R:Trauma] [52:Gradually Appeared] Wounding Event: [41R:Pressure Ulcer] [51R:Trauma, Other] [52:Trauma, Other] Primary Etiology: [41R:Sleep Apnea, Hypertension, Paraplegia Sleep Apnea, Hypertension, Paraplegia Sleep Apnea, Hypertension, Paraplegia] Comorbid History: [41R:03/16/2020] [51R:01/26/2021] [52:03/27/2021] Date Acquired: [41R:92] [51R:44] [52:38] Weeks of Treatment: [41R:Open] [51R:Open] [52:Open] Wound Status: [41R:Yes] [51R:Yes] [52:No] Wound Recurrence: [41R:Yes] [51R:No] [52:No] Clustered Wound: [41R:3] [51R:N/[name]] [52:N/[name]] Clustered Quantity: [41R:7x4x0.1] [51R:0x0x0] [52:3x7x0.1] Measurements L x W x D (cm) [41R:21.991] [51R:0] [40:81.448] [name] (cm) : rea [41R:2.199] [51R:0] [  52:1.649] Volume (cm) : [41R:-1117.70%] [51R:100.00%] [52:-775.00%] % Reduction in [name] rea: [41R:-1114.90%] [51R:100.00%] [52:-777.10%] % Reduction in Volume: [41R:Category/Stage II] [51R:Full Thickness Without Exposed] [52:Full Thickness Without Exposed] Classification: [41R:Medium] [51R:Support Structures Medium] [52:Support Structures Medium] Exudate [name] mount: [41R:Sanguinous] [51R:Serosanguineous] [52:Serosanguineous] Exudate Type: [41R:red] [51R:red, brown] [52:red, brown] Exudate Color: [41R:Distinct, outline attached] [51R:Flat and Intact] [52:Distinct, outline attached] Wound Margin: [41R:Large (67-100%)] [51R:Large (67-100%)] [52:Small (1-33%)] Granulation [name] mount: [41R:Red, Friable] [51R:Red] [52:Pink, Pale] Granulation Quality: [41R:None Present (0%)] [51R:None Present (0%)] [52:Large (67-100%)] Necrotic [name] mount: [41R:Fat Layer (Subcutaneous Tissue): Yes Fat Layer (Subcutaneous Tissue): Yes Fat Layer (Subcutaneous Tissue): Yes] Exposed Structures: [41R:Fascia: No Tendon: No Muscle: No Joint: No Bone: No None]  [51R:Fascia: No Tendon: No Muscle: No Joint: No Bone: No None] [52:Fascia: No Tendon: No Muscle: No Joint: No Bone: No None] Epithelialization: [41R:Debridement - Excisional] [51R:N/[name]] [52:N/[name]] Debridement: Pre-procedure Verification/Time Out 08:55 [51R:N/[name]] [52:N/[name]] Taken: [41R:Lidocaine] [51R:N/[name]] [52:N/[name]] Pain Control: [41R:Subcutaneous] [51R:N/[name]] [52:N/[name]] Tissue Debrided: [41R:Skin/Subcutaneous Tissue] [51R:N/[name]] [52:N/[name]] Level: [41R:28] [51R:N/[name]] [52:N/[name]] Debridement [name] (sq cm): [41R:rea Scissors] [51R:N/[name]] [52:N/[name]] Instrument: [41R:Minimum] [51R:N/[name]] [52:N/[name]] Bleeding: [41R:Silver Nitrate] [51R:N/[name]] [52:N/[name]] Hemostasis [name] chieved: [41R:0] [51R:N/[name]] [52:N/[name]] Procedural Pain: [41R:0] [51R:N/[name]] [52:N/[name]] Post Procedural Pain: [41R:Procedure was tolerated well] [51R:N/[name]] [52:N/[name]] Debridement Treatment Response: [41R:7x4x0.1] [51R:N/[name]] [52:N/[name]] Post Debridement Measurements L x W x D (cm) [41R:2.199] [51R:N/[name]] [52:N/[name]] Post Debridement Volume: (cm) [41R:Category/Stage II] [51R:N/[name]] [52:N/[name]] Post Debridement Stage: [41R:Debridement] [51R:N/[name]] [52:N/[name]] Wound Number: [id number] Photos: Left, Dorsal Foot Right, Plantar Foot Right, Lateral Lower Leg Wound Location: Gradually Appeared Gradually Appeared Gradually Appeared Wounding Event: 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 Primary Etiology: Sleep Apnea, Hypertension, Paraplegia Sleep Apnea, Hypertension, Paraplegia Sleep Apnea, Hypertension, Paraplegia Comorbid History: 07/11/2021 08/25/2021 12/08/2021 Date Acquired: 22 17 2  Weeks of Treatment: Open Open Open Wound Status: No No No Wound Recurrence: No No No Clustered Wound: N/[name] N/[name] N/[name] Clustered Quantity: 4x2.4x0.2 3.4x0.5x0.1 0x0x0 Measurements L x W x D (cm) 7.54 1.335 0 [name] (cm) : rea 1.508 0.134 0 Volume (cm) : -21.20% -30.80% 100.00% % Reduction in [name] rea: -142.40% -31.40% 100.00% % Reduction in Volume: Full Thickness Without  Exposed Full Thickness Without Exposed Category/Stage II Classification: Support Structures Support Structures Medium Medium Medium Exudate Amount: Serosanguineous Serosanguineous Serosanguineous Exudate Type: red, brown red, brown red, brown Exudate Color: Distinct, outline attached Distinct, outline attached Distinct, outline attached Wound Margin: Medium (34-66%) Medium (34-66%) Large (67-100%) Granulation Amount: Pink, Pale Pink Red Granulation Quality: Medium (34-66%) Medium (34-66%) None Present (0%) Necrotic Amount: Fat Layer (Subcutaneous Tissue): Yes Fat Layer (Subcutaneous Tissue): Yes Fat Layer (Subcutaneous Tissue): Yes Exposed Structures: Fascia: No Fascia: No Fascia: No Tendon: No Tendon: No Tendon: No Muscle: No Muscle: No Muscle: No Joint: No Joint: No Joint: No Bone: No Bone: No Bone: No Small (1-33%) Small (1-33%) None Epithelialization: N/[name] N/[name] N/[name] Debridement: N/[name] N/[name] N/[name] Pain Control: N/[name] N/[name] N/[name] Tissue Debrided: N/[name] N/[name] N/[name] Level: N/[name] N/[name] N/[name] Debridement [name] (sq cm): rea N/[name] N/[name] N/[name] Instrument: N/[name] N/[name] N/[name] Bleeding: N/[name] N/[name] N/[name] Hemostasis [name] chieved: N/[name] N/[name] N/[name] Procedural Pain: N/[name] N/[name] N/[name] Post Procedural Pain: Debridement Treatment Response: N/[name] N/[name] N/[name] Post Debridement Measurements L x N/[name] N/[name] N/[name] W x D (cm) N/[name] N/[name] N/[name] Post Debridement Volume: (cm) N/[name] N/[name] N/[name] Post Debridement Stage: N/[name] N/[name] N/[name] Procedures Performed: Treatment Notes Wound #41R (Ischium) Wound Laterality: Left Cleanser Soap and Water Discharge Instruction: May shower and wash wound with dial antibacterial soap and water prior to dressing change. Peri-Wound Care  Ketoconazole Cream 2% Discharge Instruction: Apply Ketoconazole as directed Triad Hydrophilic Wound Dressing Tube, 6 (oz) Discharge Instruction: Apply to periwound with each dressing change Topical Primary Dressing Hydrofera Blue Ready Foam, 2.5 x2.5 in Discharge Instruction: Apply to wound  bed as instructed Secondary Dressing ABD Pad, 5x9 Discharge Instruction: Apply over primary dressing as directed. Secured With 36M Medipore H Soft Cloth Surgical T 4 x 2 (in/yd) ape Discharge Instruction: Secure dressing with tape as directed. Compression Wrap Compression Stockings Add-Ons Wound #51R (Lower Leg) Wound Laterality: Left, Anterior Cleanser Soap and Water Discharge Instruction: May shower and wash wound with dial antibacterial soap and water prior to dressing change. Peri-Wound Care Topical Primary Dressing Hydrofera Blue Ready Foam, 2.5 x2.5 in Discharge Instruction: Apply to wound bed as instructed Secondary Dressing Zetuvit Plus Silicone Border Dressing 4x4 (in/in) Discharge Instruction: Apply silicone border over primary dressing as directed. Secured With 36M Medipore H Soft Cloth Surgical T 4 x 2 (in/yd) ape Discharge Instruction: Secure dressing with tape as directed. Compression Wrap Compression Stockings Add-Ons Wound #52 (Foot) Wound Laterality: Dorsal, Right Cleanser Soap and Water Discharge Instruction: May shower and wash wound with dial antibacterial soap and water prior to dressing change. Peri-Wound Care Topical Primary Dressing Hydrofera Blue Ready Foam, 2.5 x2.5 in Discharge Instruction: Apply to wound bed as instructed Secondary Dressing Woven Gauze Sponge, Non-Sterile 4x4 in Discharge Instruction: Apply over primary dressing as directed. Secured With [employer], 4.5x3.1 (in/yd) Discharge Instruction: Secure with Kerlix as directed. 36M Medipore H Soft Cloth Surgical T ape, 4 x 10 (in/yd) Discharge Instruction: Secure with tape as directed. Compression Wrap Compression Stockings Add-Ons Wound #56 (Foot) Wound Laterality: Dorsal, Left Cleanser Soap and Water Discharge Instruction: May shower and wash wound with dial antibacterial soap and water prior to dressing change. Peri-Wound Care Topical Mupirocin Ointment Discharge  Instruction: Apply Mupirocin (Bactroban) as instructed Primary Dressing Hydrofera Blue Classic Foam, 4x4 in Discharge Instruction: Moisten with saline prior to applying to wound bed Secondary Dressing Woven Gauze Sponge, Non-Sterile 4x4 in Discharge Instruction: Apply over primary dressing as directed. Secured With [employer], 4.5x3.1 (in/yd) Discharge Instruction: Secure with Kerlix as directed. 36M Medipore H Soft Cloth Surgical T ape, 4 x 10 (in/yd) Discharge Instruction: Secure with tape as directed. Compression Wrap Compression Stockings Add-Ons Wound #57 (Foot) Wound Laterality: Plantar, Right Cleanser Soap and Water Discharge Instruction: May shower and wash wound with dial antibacterial soap and water prior to dressing change. Peri-Wound Care Topical Primary Dressing Hydrofera Blue Ready Foam, 2.5 x2.5 in Discharge Instruction: Apply to wound bed as instructed Secondary Dressing Woven Gauze Sponge, Non-Sterile 4x4 in Discharge Instruction: Apply over primary dressing as directed. Secured With [employer], 4.5x3.1 (in/yd) Discharge Instruction: Secure with Kerlix as directed. 36M Medipore H Soft Cloth Surgical T 4 x 2 (in/yd) ape Discharge Instruction: Secure dressing with tape as directed. Compression Wrap Compression Stockings Add-Ons Wound #60 (Lower Leg) Wound Laterality: Right, Lateral Cleanser Soap and Water Discharge Instruction: May shower and wash wound with dial antibacterial soap and water prior to dressing change. Peri-Wound Care Topical Primary Dressing Hydrofera Blue Ready Foam, 2.5 x2.5 in Discharge Instruction: Apply to wound bed as instructed Secondary Dressing Woven Gauze Sponge, Non-Sterile 4x4 in Discharge Instruction: Apply over primary dressing as directed. Secured With [employer], 4.5x3.1 (in/yd) Discharge Instruction: Secure with Kerlix as directed. 36M Medipore H Soft Cloth Surgical T 4 x 2  (in/yd) ape Discharge Instruction: Secure dressing with tape as directed. Compression Wrap  Compression Stockings Add-Ons Electronic Signature(s) Signed: 12/22/2021 4:28:46 PM By: Linton Ham MD Signed: 12/22/2021 5:37:15 PM By: Levan Hurst RN, BSN Entered By: Linton Ham on 12/22/2021 10:01:05 -------------------------------------------------------------------------------- Multi-Disciplinary Care Plan Details Patient Name: Date of Service: [name] [name] [name] 12/22/2021 8:00 [name] M Medical Record Number: [id number] Patient Account Number: [id number] Date of Birth/Sex: Treating RN: 12/09/1985 (36 y.o. [name] Primary Care Annaly Skop: [name], [name] Other Clinician: Referring Nickola Lenig: Treating Ambera Fedele/Extender: [name]: [place] reviewed with physician Active Inactive Wound/Skin Impairment Nursing Diagnoses: Impaired tissue integrity Knowledge deficit related to ulceration/compromised skin integrity Goals: Patient/caregiver will verbalize understanding of skin care regimen Date Initiated: 01/05/2016 Target Resolution Date: 01/23/2022 Goal Status: Active Ulcer/skin breakdown will have [name] volume reduction of 30% by week 4 Date Initiated: 01/05/2016 Date Inactivated: 12/22/2017 Target Resolution Date: 01/19/2018 Unmet Reason: complex wounds, Goal Status: Unmet infection Interventions: Assess patient/caregiver ability to obtain necessary supplies Assess ulceration(s) every visit Provide education on ulcer and skin care Notes: 02/02/21: Complex Care, ongoing. Electronic Signature(s) Signed: 12/23/2021 4:37:24 PM By: Rhae Hammock RN Entered By: Rhae Hammock on 12/22/2021 08:39:01 -------------------------------------------------------------------------------- Pain Assessment Details Patient Name: Date of Service: [name] [name] [name] 12/22/2021 8:00 [name] M Medical Record Number: [id number] Patient  Account Number: [id number] Date of Birth/Sex: Treating RN: August 14, 1986 (36 y.o. [name] Primary Care Tegan Britain: [name], [name] Other Clinician: Referring Kera Deacon: Treating Starla Deller/Extender: [name]: 311 Active Problems Location of Pain Severity and Description of Pain Patient Has Paino No Site Locations Pain Management and Medication Current Pain Management: Electronic Signature(s) Signed: 12/23/2021 4:37:24 PM By: Rhae Hammock RN Entered By: Rhae Hammock on 12/22/2021 08:06:04 -------------------------------------------------------------------------------- Patient/Caregiver Education Details Patient Name: Date of Service: [name] [name] Viviann Spare 1/31/2023andnbsp8:00 [name] M Medical Record Number: [id number] Patient Account Number: [id number] Date of Birth/Gender: Treating RN: May 12, 1986 (36 y.o. [name] Primary Care Physician: Janine Limbo Other Clinician: Referring Physician: Treating Physician/Extender: [name]: 46 Education Assessment Education Provided To: Patient Education Topics Provided Wound/Skin Impairment: Methods: Explain/Verbal Responses: Reinforcements needed, State content correctly Electronic Signature(s) Signed: 12/23/2021 4:37:24 PM By: Rhae Hammock RN Entered By: Rhae Hammock on 12/22/2021 08:39:37 -------------------------------------------------------------------------------- Wound Assessment Details Patient Name: Date of Service: [name] [name] [name] 12/22/2021 8:00 [name] M Medical Record Number: [id number] Patient Account Number: [id number] Date of Birth/Sex: Treating RN: Jun 13, 1986 (36 y.o. [name] Primary Care Deandrea Vanpelt: [name], [name] Other Clinician: Referring Jacqulyne Gladue: Treating Shaheen Star/Extender: [name]: 311 Wound Status Wound Number: [id number] Primary Etiology: Pressure Ulcer Wound  Location: Left Ischium Wound Status: Open Wounding Event: Gradually Appeared Comorbid History: Sleep Apnea, Hypertension, Paraplegia Date Acquired: 03/16/2020 Weeks Of Treatment: 92 Clustered Wound: Yes Photos Wound Measurements Length: (cm) 7 Width: (cm) 4 Depth: (cm) 0.1 Clustered Quantity: 3 Area: (cm) 21.991 Volume: (cm) 2.199 % Reduction in Area: -1117.7% % Reduction in Volume: -1114.9% Epithelialization: None Wound Description Classification: Category/Stage II Wound Margin: Distinct, outline attached Exudate Amount: Medium Exudate Type: Sanguinous Exudate Color: red Foul Odor After Cleansing: No Slough/Fibrino No Wound Bed Granulation Amount: Large (67-100%) Exposed Structure Granulation Quality: Red, Friable Fascia Exposed: No Necrotic Amount: None Present (0%) Fat Layer (Subcutaneous Tissue) Exposed: Yes Tendon Exposed: No Muscle Exposed: No Joint Exposed: No Bone Exposed: No Treatment Notes Wound #41R (Ischium) Wound Laterality: Left Cleanser Soap and Water Discharge Instruction: May shower and wash wound with dial antibacterial soap and water prior to dressing  change. Peri-Wound Care Ketoconazole Cream 2% Discharge Instruction: Apply Ketoconazole as directed Triad Hydrophilic Wound Dressing Tube, 6 (oz) Discharge Instruction: Apply to periwound with each dressing change Topical Primary Dressing Hydrofera Blue Ready Foam, 2.5 x2.5 in Discharge Instruction: Apply to wound bed as instructed Secondary Dressing ABD Pad, 5x9 Discharge Instruction: Apply over primary dressing as directed. Secured With 71M Medipore H Soft Cloth Surgical T 4 x 2 (in/yd) ape Discharge Instruction: Secure dressing with tape as directed. Compression Wrap Compression Stockings Add-Ons Electronic Signature(s) Signed: 12/23/2021 4:37:24 PM By: Rhae Hammock RN Entered By: Rhae Hammock on 12/22/2021  08:27:34 -------------------------------------------------------------------------------- Wound Assessment Details Patient Name: Date of Service: [name] [name] [name] 12/22/2021 8:00 [name] M Medical Record Number: [id number] Patient Account Number: [id number] Date of Birth/Sex: Treating RN: Jun 23, 1986 (36 y.o. [name] Primary Care Cuyler Vandyken: [name], [name] Other Clinician: Referring Clarrissa Shimkus: Treating Carah Barrientes/Extender: [name]: 311 Wound Status Wound Number: [id number] Primary Etiology: Trauma, Other Wound Location: Left, Anterior Lower Leg Wound Status: Open Wounding Event: Trauma Comorbid History: Sleep Apnea, Hypertension, Paraplegia Date Acquired: 01/26/2021 Weeks Of Treatment: 44 Clustered Wound: No Photos Wound Measurements Length: (cm) Width: (cm) Depth: (cm) Area: (cm) Volume: (cm) 0 % Reduction in Area: 100% 0 % Reduction in Volume: 100% 0 Epithelialization: None 0 Tunneling: No 0 Undermining: No Wound Description Classification: Full Thickness Without Exposed Support Structures Wound Margin: Flat and Intact Exudate Amount: Medium Exudate Type: Serosanguineous Exudate Color: red, brown Foul Odor After Cleansing: No Slough/Fibrino No Wound Bed Granulation Amount: Large (67-100%) Exposed Structure Granulation Quality: Red Fascia Exposed: No Necrotic Amount: None Present (0%) Fat Layer (Subcutaneous Tissue) Exposed: Yes Tendon Exposed: No Muscle Exposed: No Joint Exposed: No Bone Exposed: No Treatment Notes Wound #51R (Lower Leg) Wound Laterality: Left, Anterior Cleanser Soap and Water Discharge Instruction: May shower and wash wound with dial antibacterial soap and water prior to dressing change. Peri-Wound Care Topical Primary Dressing Hydrofera Blue Ready Foam, 2.5 x2.5 in Discharge Instruction: Apply to wound bed as instructed Secondary Dressing Zetuvit Plus Silicone Border Dressing 4x4 (in/in) Discharge  Instruction: Apply silicone border over primary dressing as directed. Secured With 71M Medipore H Soft Cloth Surgical T 4 x 2 (in/yd) ape Discharge Instruction: Secure dressing with tape as directed. Compression Wrap Compression Stockings Add-Ons Electronic Signature(s) Signed: 12/23/2021 4:37:24 PM By: Rhae Hammock RN Entered By: Rhae Hammock on 12/22/2021 08:19:07 -------------------------------------------------------------------------------- Wound Assessment Details Patient Name: Date of Service: [name] [name] [name] 12/22/2021 8:00 [name] M Medical Record Number: [id number] Patient Account Number: [id number] Date of Birth/Sex: Treating RN: [birth year] (36 y.o. [name] Primary Care Tanajah Boulter: [name], [name] Other Clinician: Referring Toussaint Golson: Treating Breanne Olvera/Extender: [name]: 311 Wound Status Wound Number: 52 Primary Etiology: Trauma, Other Wound Location: Right, Dorsal Foot Wound Status: Open Wounding Event: Gradually Appeared Comorbid History: Sleep Apnea, Hypertension, Paraplegia Date Acquired: 03/27/2021 Weeks Of Treatment: 38 Clustered Wound: No Photos Wound Measurements Length: (cm) 3 Width: (cm) 7 Depth: (cm) 0.1 Area: (cm) 16.493 Volume: (cm) 1.649 % Reduction in Area: -775% % Reduction in Volume: -777.1% Epithelialization: None Tunneling: No Undermining: No Wound Description Classification: Full Thickness Without Exposed Support Structures Wound Margin: Distinct, outline attached Exudate Amount: Medium Exudate Type: Serosanguineous Exudate Color: red, brown Wound Bed Granulation Amount: Small (1-33%) Granulation Quality: Pink, Pale Necrotic Amount: Large (67-100%) Necrotic Quality: Adherent Slough Foul Odor After Cleansing: No Slough/Fibrino Yes Exposed Structure Fascia Exposed: No Fat Layer (Subcutaneous Tissue) Exposed: Yes Tendon  Exposed: No Muscle Exposed: No Joint Exposed: No Bone  Exposed: No Treatment Notes Wound #52 (Foot) Wound Laterality: Dorsal, Right Cleanser Soap and Water Discharge Instruction: May shower and wash wound with dial antibacterial soap and water prior to dressing change. Peri-Wound Care Topical Primary Dressing Hydrofera Blue Ready Foam, 2.5 x2.5 in Discharge Instruction: Apply to wound bed as instructed Secondary Dressing Woven Gauze Sponge, Non-Sterile 4x4 in Discharge Instruction: Apply over primary dressing as directed. Secured With [employer], 4.5x3.1 (in/yd) Discharge Instruction: Secure with Kerlix as directed. 92M Medipore H Soft Cloth Surgical T ape, 4 x 10 (in/yd) Discharge Instruction: Secure with tape as directed. Compression Wrap Compression Stockings Add-Ons Electronic Signature(s) Signed: 12/23/2021 4:37:24 PM By: Rhae Hammock RN Entered By: Rhae Hammock on 12/22/2021 08:20:49 -------------------------------------------------------------------------------- Wound Assessment Details Patient Name: Date of Service: [name] [name] [name] 12/22/2021 8:00 [name] M Medical Record Number: [id number] Patient Account Number: [id number] Date of Birth/Sex: Treating RN: Jun 26, 1986 (36 y.o. [name] Primary Care Keshia Weare: [name], [name] Other Clinician: Referring Alayia Meggison: Treating Marlisha Vanwyk/Extender: [name]: 311 Wound Status Wound Number: 56 Primary Etiology: Neuropathic Ulcer-Non Diabetic Wound Location: Left, Dorsal Foot Wound Status: Open Wounding Event: Gradually Appeared Comorbid History: Sleep Apnea, Hypertension, Paraplegia Date Acquired: 07/11/2021 Weeks Of Treatment: 22 Clustered Wound: No Photos Wound Measurements Length: (cm) 4 Width: (cm) 2.4 Depth: (cm) 0.2 Area: (cm) 7.54 Volume: (cm) 1.508 % Reduction in Area: -21.2% % Reduction in Volume: -142.4% Epithelialization: Small (1-33%) Tunneling: No Undermining: No Wound  Description Classification: Full Thickness Without Exposed Support Structures Wound Margin: Distinct, outline attached Exudate Amount: Medium Exudate Type: Serosanguineous Exudate Color: red, brown Foul Odor After Cleansing: No Slough/Fibrino Yes Wound Bed Granulation Amount: Medium (34-66%) Exposed Structure Granulation Quality: Pink, Pale Fascia Exposed: No Necrotic Amount: Medium (34-66%) Fat Layer (Subcutaneous Tissue) Exposed: Yes Necrotic Quality: Adherent Slough Tendon Exposed: No Muscle Exposed: No Joint Exposed: No Bone Exposed: No Treatment Notes Wound #56 (Foot) Wound Laterality: Dorsal, Left Cleanser Soap and Water Discharge Instruction: May shower and wash wound with dial antibacterial soap and water prior to dressing change. Peri-Wound Care Topical Mupirocin Ointment Discharge Instruction: Apply Mupirocin (Bactroban) as instructed Primary Dressing Hydrofera Blue Classic Foam, 4x4 in Discharge Instruction: Moisten with saline prior to applying to wound bed Secondary Dressing Woven Gauze Sponge, Non-Sterile 4x4 in Discharge Instruction: Apply over primary dressing as directed. Secured With [employer], 4.5x3.1 (in/yd) Discharge Instruction: Secure with Kerlix as directed. 92M Medipore H Soft Cloth Surgical T ape, 4 x 10 (in/yd) Discharge Instruction: Secure with tape as directed. Compression Wrap Compression Stockings Add-Ons Electronic Signature(s) Signed: 12/23/2021 4:37:24 PM By: Rhae Hammock RN Entered By: Rhae Hammock on 12/22/2021 08:21:46 -------------------------------------------------------------------------------- Wound Assessment Details Patient Name: Date of Service: Proffit, [name] [name] 12/22/2021 8:00 [name] M Medical Record Number: [id number] Patient Account Number: [id number] Date of Birth/Sex: Treating RN: 1986-01-04 (36 y.o. [name] Primary Care Swannie Milius: [name], [name] Other Clinician: Referring Madina Galati: Treating  Namir Neto/Extender: [name]: 311 Wound Status Wound Number: 57 Primary Etiology: Neuropathic Ulcer-Non Diabetic Wound Location: Right, Plantar Foot Wound Status: Open Wounding Event: Gradually Appeared Comorbid History: Sleep Apnea, Hypertension, Paraplegia Date Acquired: 08/25/2021 Weeks Of Treatment: 17 Clustered Wound: No Photos Wound Measurements Length: (cm) 3.4 Width: (cm) 0.5 Depth: (cm) 0.1 Area: (cm) 1.335 Volume: (cm) 0.134 % Reduction in Area: -30.8% % Reduction in Volume: -31.4% Epithelialization: Small (1-33%) Tunneling: No Undermining: No Wound Description Classification: Full Thickness  Without Exposed Support Structures Wound Margin: Distinct, outline attached Exudate Amount: Medium Exudate Type: Serosanguineous Exudate Color: red, brown Foul Odor After Cleansing: No Slough/Fibrino Yes Wound Bed Granulation Amount: Medium (34-66%) Exposed Structure Granulation Quality: Pink Fascia Exposed: No Necrotic Amount: Medium (34-66%) Fat Layer (Subcutaneous Tissue) Exposed: Yes Necrotic Quality: Adherent Slough Tendon Exposed: No Muscle Exposed: No Joint Exposed: No Bone Exposed: No Treatment Notes Wound #57 (Foot) Wound Laterality: Plantar, Right Cleanser Soap and Water Discharge Instruction: May shower and wash wound with dial antibacterial soap and water prior to dressing change. Peri-Wound Care Topical Primary Dressing Hydrofera Blue Ready Foam, 2.5 x2.5 in Discharge Instruction: Apply to wound bed as instructed Secondary Dressing Woven Gauze Sponge, Non-Sterile 4x4 in Discharge Instruction: Apply over primary dressing as directed. Secured With [employer], 4.5x3.1 (in/yd) Discharge Instruction: Secure with Kerlix as directed. 52M Medipore H Soft Cloth Surgical T 4 x 2 (in/yd) ape Discharge Instruction: Secure dressing with tape as directed. Compression Wrap Compression  Stockings Add-Ons Electronic Signature(s) Signed: 12/23/2021 4:37:24 PM By: Rhae Hammock RN Entered By: Rhae Hammock on 12/22/2021 08:22:30 -------------------------------------------------------------------------------- Wound Assessment Details Patient Name: Date of Service: Tiedt, [name] [name] 12/22/2021 8:00 [name] M Medical Record Number: [id number] Patient Account Number: [id number] Date of Birth/Sex: Treating RN: 1986/08/08 (36 y.o. [name] Primary Care Purnell Daigle: [name], [name] Other Clinician: Referring Marlies Ligman: Treating Cordera Stineman/Extender: [name]: 311 Wound Status Wound Number: 60 Primary Etiology: Pressure Ulcer Wound Location: Right, Lateral Lower Leg Wound Status: Open Wounding Event: Gradually Appeared Comorbid History: Sleep Apnea, Hypertension, Paraplegia Date Acquired: 12/08/2021 Weeks Of Treatment: 2 Clustered Wound: No Photos Wound Measurements Length: (cm) Width: (cm) Depth: (cm) Area: (cm) Volume: (cm) 0 % Reduction in Area: 100% 0 % Reduction in Volume: 100% 0 Epithelialization: None 0 0 Wound Description Classification: Category/Stage II Wound Margin: Distinct, outline attached Exudate Amount: Medium Exudate Type: Serosanguineous Exudate Color: red, brown Foul Odor After Cleansing: No Slough/Fibrino No Wound Bed Granulation Amount: Large (67-100%) Exposed Structure Granulation Quality: Red Fascia Exposed: No Necrotic Amount: None Present (0%) Fat Layer (Subcutaneous Tissue) Exposed: Yes Tendon Exposed: No Muscle Exposed: No Joint Exposed: No Bone Exposed: No Treatment Notes Wound #60 (Lower Leg) Wound Laterality: Right, Lateral Cleanser Soap and Water Discharge Instruction: May shower and wash wound with dial antibacterial soap and water prior to dressing change. Peri-Wound Care Topical Primary Dressing Hydrofera Blue Ready Foam, 2.5 x2.5 in Discharge Instruction: Apply to  wound bed as instructed Secondary Dressing Woven Gauze Sponge, Non-Sterile 4x4 in Discharge Instruction: Apply over primary dressing as directed. Secured With [employer], 4.5x3.1 (in/yd) Discharge Instruction: Secure with Kerlix as directed. 52M Medipore H Soft Cloth Surgical T 4 x 2 (in/yd) ape Discharge Instruction: Secure dressing with tape as directed. Compression Wrap Compression Stockings Add-Ons Electronic Signature(s) Signed: 12/23/2021 4:37:24 PM By: Rhae Hammock RN Entered By: Rhae Hammock on 12/22/2021 08:23:20 -------------------------------------------------------------------------------- Vitals Details Patient Name: Date of Service: Mcwhirt, [name] [name] 12/22/2021 8:00 [name] M Medical Record Number: [id number] Patient Account Number: [id number] Date of Birth/Sex: Treating RN: 1986-09-25 (36 y.o. [name] Primary Care Emelly Wurtz: Henriette, Lusk Other Clinician: Referring Yakir Wenke: Treating Sukhdeep Wieting/Extender: [name]: 311 Vital Signs Time Taken: 08:04 Temperature (F): 98 Height (in): 70 Pulse (bpm): 121 Weight (lbs): 216 Respiratory Rate (breaths/min): 17 Body Mass Index (BMI): 31 Blood Pressure (mmHg): 130/74 Reference Range: 80 - 120 mg / dl Electronic Signature(s) Signed: 12/23/2021 4:37:24 PM By: Hollie Salk,  Lauren RN Entered By: Rhae Hammock on 12/22/2021 08:05:04

## 2021-12-23 NOTE — Progress Notes (Signed)
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 first and second toes/web space bilaterally 06/24/17; patient with history of paraplegia also chronic venous insufficiency with lymphedema. Has [name] very difficult wound on the left lateral leg. This has been gradually reducing in terms of with but comes in with [name] very dry adherent surface. High switch to silver collagen [name] week or so ago with hydrogel to keep the area moist. This is been refractory to multiple dressing attempts. He also has areas in his first and second toes bilaterally in the anterior and posterior web space. I had been using Iodoflex here after [name] prolonged course of silver alginate with ketoconazole was ineffective [question tinea pedis] 07/14/17; patient arrives today with [name] very difficult adherent material over his left lateral lower leg wound. He also has surrounding erythema and poorly controlled edema. He was switched his Santyl last visit which the nurses are applying once during his doctor visit and once on [name] nurse visit. He was also reduced to 2 layer compression I'm not exactly sure of the issue here. 07/21/17; better surface today after 1 week of Iodoflex. Significant cellulitis that we treated last week also better. [Doxycycline] 07/28/17 better surface today with now 2 weeks of Iodoflex. Significant cellulitis treated  with doxycycline. He has now completed the doxycycline and he is back to his usual degree of chronic venous inflammation/stasis dermatitis. He reminds me he has had ablations surgery here 08/04/17; continued improvement with Iodoflex to the left lateral leg wound  in terms of the surface of the wound although the dimensions are better. 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 Problematic areas on the plantar aspect of the first second toe web space on the left and the dorsal aspect of the first second toe web space on the right. At one point I felt these were probably related to chronic fungal infections in treated him aggressively for this although we have not made any improvement here. 08/11/17; left lateral leg. Surface continues to improve with the Iodoflex although we are not seeing much improvement in overall wound dimensions. Areas on his plantar left foot and right foot show no improvement. In fact the right foot looks somewhat worse 08/18/17; left lateral leg. We changed to [facility] Blue last week after [name] prolonged course of Iodoflex which helps get the surface better. It appears that the wound with is improved. Continue with difficult areas on the left dorsal first second and plantar first second on the right 09/01/17; patient arrives in clinic today having had [name] temperature of 103 yesterday. He was seen in the ER and [facility]. The patient was concerned he could have cellulitis again in the right leg however they diagnosed him with [name] UTI and he is now on Keflex. He has [name] history of cellulitis which is been recurrent and difficult but this is been in the left leg, in the past 5 use doxycycline. He does in and out catheterizations at home which are risk factors for UTI 09/08/17; patient will be completing his Keflex this weekend. The erythema on the left leg is considerably better. He has [name] new wound today on the medial part of the right leg small superficial almost looks like [name] skin tear. He has worsening of the area on the right dorsal first and second toe. His major area on the left lateral leg is better. Using Hydrofera Blue on all areas 09/15/17; gradual reduction in width on  the long wound in the left lateral leg. No debridement required. He also has wounds on the plantar aspect of his left first second toe web space and on the dorsal aspect of the right first second toe web space. 09/22/17; there continues to be very gradual improvements in the dimensions of the left lateral leg wound. He hasn't round erythematous spot with might be pressure on his wheelchair. There is no evidence obviously of infection no purulence no warmth He has [name] dry scaled area on the plantar aspect of the left first second toe Improved area on the dorsal right first second toe. 09/29/17; left lateral leg wound continues to improve in dimensions mostly with an is still [name] fairly long but increasingly narrow wound. He has [name] dry scaled area on the plantar aspect of his left first second toe web space Increasingly concerning area on the dorsal right first second toe. In fact I am concerned today about possible cellulitis around this wound. The areas extending up his second toe and although there is deformities here almost appears to abut on the nailbed. 10/06/17; left lateral leg wound continues to make very gradual progress. Tissue culture I did from the right first second toe dorsal foot last time grew MRSA and enterococcus which  was vancomycin sensitive. This was not sensitive to clindamycin or doxycycline. He is allergic to Zyvox and sulfa we have therefore arrange for him to have dalvance infusion tomorrow. He is had this in the past and tolerated it well 10/20/17; left lateral leg wound continues to make decent progress. This is certainly reduced in terms of with there is advancing epithelialization.The cellulitis in the right foot looks better although he still has [name] deep wound in the dorsal aspect of the first second toe web space. Plantar left first toe web space on the left I think is making some progress 10/27/17; left lateral leg wound continues to make decent progress. Advancing  epithelialization.using Hydrofera Blue The right first second toe web space wound is better-looking using silver alginate Improvement in the left plantar first second toe web space. Again using silver alginate 11/03/17 left lateral leg wound continues to make decent progress albeit slowly. Using [facility] The right per second toe web space continues to be [name] very problematic looking punched out wound. I obtained [name] piece of tissue for deep culture I did extensively treated this for fungus. It is difficult to imagine that this is [name] pressure area as the patient states other than going outside he doesn't really wear shoes at home The left plantar first second toe web space looked fairly senescent. Necrotic edges. This required debridement change to [facility] Blue to all wound areas 11/10/17; left lateral leg wound continues to contract. Using Hydrofera Blue On the right dorsal first second toe web space dorsally. Culture I did of this area last week grew MRSA there is not an easy oral option in this patient was multiple antibiotic allergies or intolerances. This was only [name] rare culture isolate I'm therefore going to use Bactroban under silver alginate On the left plantar first second toe web space. Debridement is required here. This is also unchanged 11/17/17; left lateral leg wound continues to contract using Hydrofera Blue this is no longer the major issue. The major concern here is the right first second toe web space. He now has an open area going from dorsally to the plantar aspect. There is now wound on the inner lateral part of the first toe. Not [name] very viable surface on this. There is erythema spreading medially into the forefoot. No major change in the left first second toe plantar wound 11/24/17; left lateral leg wound continues to contract using Hydrofera Blue. Nice improvement today The right first second toe web space all of this looks [name] lot less angry than last week. I have given him  clindamycin and topical Bactroban for MRSA and terbinafine for the possibility of underlining tinea pedis that I could not control with ketoconazole. Looks somewhat better The area on the plantar left first second toe web space is weeping with dried debris around the wound 12/01/17; left lateral leg wound continues to contract he Hydrofera Blue. It is becoming thinner in terms of with nevertheless it is making good improvement. The right first second toe web space looks less angry but still [name] large necrotic-looking wounds starting on the plantar aspect of the right foot extending between the toes and now extensively on the base of the right second toe. I gave him clindamycin and topical Bactroban for MRSA anterior benefiting for the possibility of underlying tinea pedis. Not looking better today The area on the left first/second toe looks better. Debrided of necrotic debris 12/05/17* the patient was worked in urgently today because over the weekend he found blood  on his incontinence bad when he woke up. He was found to have an ulcer by his wife who does most of his wound care. He came in today for [place] to look at this. He has not had [name] history of wounds in his buttocks in spite of his paraplegia. 12/08/17; seen in follow-up today at his usual appointment. He was seen earlier this week and found to have [name] new wound on his buttock. We also follow him for wounds on the left lateral leg, left first second toe web space and right first second toe web space 12/15/17; we have been using Hydrofera Blue to the left lateral leg which has improved. The right first second toe web space has also improved. Left first second toe web space plantar aspect looks stable. The left buttock has worsened using Santyl. Apparently the buttock has drainage 12/22/17; we have been using Hydrofera Blue to the left lateral leg which continues to improve now 2 small wounds separated by normal skin. He tells [place] he had [name] fever up to 100  yesterday he is prone to UTIs but has not noted anything different. He does in and out catheterizations. The area between the first and second toes today does not look good necrotic surface covered with what looks to be purulent drainage and erythema extending into the third toe. I had gotten this to something that I thought look better last time however it is not look good today. He also has [name] necrotic surface over the buttock wound which is expanded. I thought there might be infection under here so I removed [name] lot of the surface with [name] #5 curet though nothing look like it really needed culturing. He is been using Santyl to this area 12/27/17; his original wound on the left lateral leg continues to improve using Hydrofera Blue. I gave him samples of Baxdella although he was unable to take them out of fear for an allergic reaction ["lump in his throat"].the culture I did of the purulent drainage from his second toe last week showed both enterococcus and [name] set Enterobacter I was also concerned about the erythema on the bottom of his foot although paradoxically although this looks somewhat better today. 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 We've been using silver alginate here. They came up today that he sleeps in [name] recliner, I'm not sure why but I asked him to stop this 01/03/18; his original wound we've been using Hydrofera Blue is now separated into 2 areas. Ulcer on his left buttock is better he is off the recliner and sleeping in bed Finally both wound areas between his first and second toes also looks some better 01/10/18; his original wound on the left lateral leg is now separated into 2 wounds we've been using Hydrofera Blue Ulcer on his left buttock has some drainage. There is [name] small probing site going into muscle layer superiorly.using silver alginate -He arrives today with [name] deep tissue injury on  the left heel The wound on the dorsal aspect of his first second toe on the left looks [name] lot betterusing silver alginate ketoconazole The area on the first second toe web space on the right also looks [name] lot bette 01/17/18; his original wound on the left lateral leg continues to progress using Hydrofera Blue Ulcer on his left buttock also is smaller surface healthier except for [name] small probing site going into the muscle  layer superiorly. 2.4 cm of tunneling in this area DTI on his left heel we have only been offloading. Looks better than last week no threatened open no evidence of infection the wound on the dorsal aspect of the first second toe on the left continues to look like it's regressing we have only been using silver alginate and terbinafine orally The area in the first second toe web space on the right also looks to be [name] lot better using silver alginate and terbinafine I think this was prompted by tinea pedis 01/31/18; the patient was hospitalized in [place] last week apparently for [name] complicated UTI. He was discharged on cefepime he does in and out catheterizations. In the hospital he was discovered M I don't mild elevation of AST and ALT and the terbinafine was stopped.predictably the pressure ulcer on s his buttock looks betterusing silver alginate. The area on the left lateral leg also is better using Hydrofera Blue. The area between the first and second toes on the left better. First and second toes on the right still substantial but better. Finally the DTI on the left heel has held together and looks like it's resolving 02/07/18-he is here in follow-up evaluation for multiple ulcerations. He has new injury to the lateral aspect of the last issue [name] pressure ulcer, he states this is from adhesive removal trauma. He states he has tried multiple adhesive products with no success. All other ulcers appear stable. The left heel DTI is resolving. We will continue with same treatment plan and  follow-up next week. 02/14/18; follow-up for multiple areas. He has [name] new area last week on the lateral aspect of his pressure ulcer more over the posterior trochanter. The original pressure ulcer looks quite stable has healthy granulation. 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 Almost fully epithelialized on the original superior area using Hydrofera Blue DTI on the left heel has peeled off this week to reveal [name] small superficial wound under denuded skin and subcutaneous tissue Both areas between the first and second toes look better including nothing open on the left 02/21/18; The patient's wounds on his left ischial tuberosity and posterior left greater trochanter actually looked better. He has [name] large area of irritation around the area which I think is contact dermatitis. I am doubtful that this is fungal His original wound on the left lateral calf continues to improve we have been using Hydrofera Blue There is no open area in the left first second toe web space although there is [name] lot of thick callus The DTI on the left heel required debridement today of necrotic surface eschar and subcutaneous tissue using silver alginate Finally the area on the right first second toe webspace continues to contract using silver alginate and ketoconazole 02/28/18 Left ischial tuberosity wounds look better using silver alginate. Original wound on the left calf only has one small open area left using Hydrofera Blue DTI on the left heel required debridement mostly removing skin from around this wound surface. Using silver alginate The areas on the right first/second toe web space using silver alginate and ketoconazole 03/08/18 on evaluation today patient appears to be doing decently well as best I can tell in regard to his wounds. This is the first time that I have seen him as he generally is followed by Dr.  Dellia Nims. With that being said none of his wounds appear to be infected he does  have an area where there is some skin covering what appears to be [name] new wound on the left dorsal surface of his great toe. This is right at the nail bed. With that being said I do believe that debrided away some of the excess skin can be of benefit in this regard. Otherwise he has been tolerating the dressing changes without complication. 03/14/18; patient arrives today with the multiplicity of wounds that we are following. He has not been systemically unwell Original wound on the left lateral calf now only has 2 small open areas we've been using Hydrofera Blue which should continue The deep tissue injury on the left heel requires debridement today. We've been using silver alginate The left first second toe and the right first second toe are both are reminiscence what I think was tinea pedis. Apparently some of the callus Surface between the toes was removed last week when it started draining. Purulent drainage coming from the wound on the ischial tuberosity on the left. 03/21/18-He is here in follow-up evaluation for multiple wounds. 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 He tolerated debridement. The only change to last week's recommendations is to discontinue antifungal cream between toes. He will follow-up next week 03/28/18; following up for multiple wounds;Concern this week is streaking redness and swelling in the right foot. He is going to need antibiotics for this. 03/31/18; follow-up for right foot cellulitis. Streaking redness and swelling in the right foot on 03/28/18. 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 I put him on clindamycin 300 mg every 6 and brought him in for [name] quick check. He has an open wound between his first and second toes on the right foot as [name] potential source. 04/04/18; Right foot cellulitis is  resolving he is completing clindamycin. This is truly good news Left lateral calf wound which is initial wound only has one small open area inferiorly this is close to healing out. He has compression stockings. We will use Hydrofera Blue right down to the epithelialization of this Nonviable surface on the left heel which was initially pressure with [name] DTI. We've been using Hydrofera Blue. I'm going to switch this back to silver alginate Left first second toe/tinea pedis this looks better using silver alginate Right first second toe tinea pedis using silver alginate Large pressure ulcers on theLeft ischial tuberosity. Small wound here Looks better. I am uncertain about the surface over the large wound. Using silver alginate 04/11/18; Cellulitis in the right foot is resolved Left lateral calf wound which was his original wounds still has 2 tiny open areas remaining this is just about closed Nonviable surface on the left heel is better but still requires debridement Left first second toe/tinea pedis still open using silver alginate Right first second toe wound tinea pedis I asked him to go back to using ketoconazole and silver alginate Large pressure ulcers on the left ischial tuberosity this shear injury here is resolved. Wound is smaller. No evidence of infection using silver alginate 04/18/18; Patient arrives with an intense area of cellulitis in the right mid lower calf extending into the right heel area. Bright red and warm. Smaller area on the left anterior leg. He has [name] significant history of MRSA. He will definitely need antibioticsdoxycycline He now has 2 open areas on the left ischial tuberosity the original large wound and now [name] satellite area which I think was above his initial satellite areas. Not [name]  wonderful surface on this satellite area surrounding erythema which looks like pressure related. His left lateral calf wound again his original wound is just about closed Left heel pressure  injury still requiring debridement Left first second toe looks [name] lot better using silver alginate Right first second toe also using silver alginate and ketoconazole cream also looks better 04/20/18; the patient was worked in early today out of concerns with his cellulitis on the right leg. I had started him on doxycycline. This was 2 days ago. His wife was concerned about the swelling in the area. Also concerned about the left buttock. He has not been systemically unwell no fever chills. No nausea vomiting or diarrhea 04/25/18; the patient's left buttock wound is continued to deteriorate he is using Hydrofera Blue. He is still completing clindamycin for the cellulitis on the right leg although all of this looks better. 05/02/18 Left buttock wound still with [name] lot of drainage and [name] very tightly adherent fibrinous necrotic surface. He has [name] deeper area superiorly The left lateral calf wound is still closed DTI wound on the left heel necrotic surface especially the circumference using Iodoflex Areas between his left first second toe and right first second toe both look better. Dorsally and the right first second toe he had [name] necrotic surface although at smaller. In using silver alginate and ketoconazole. I did [name] culture last week which was [name] deep tissue culture of the reminiscence of the open wound on the right first second toe dorsally. This grew [name] few Acinetobacter and [name] few methicillin-resistant staph aureus. Nevertheless the area actually this week looked better. I didn't feel the need to specifically address this at least in terms of systemic antibiotics. 05/09/18; wounds are measuring larger more drainage per our intake. We are using Santyl covered with alginate on the large superficial buttock wounds, Iodosorb on the left heel, ketoconazole and silver alginate to the dorsal first and second toes bilaterally. 05/16/18; The area on his left buttock better in some aspects although the area superiorly  over the ischial tuberosity required an extensive debridement.using Santyl Left heel appears stable. Using Iodoflex The areas between his first and second toes are not bad however there is spreading erythema up the dorsal aspect of his left foot this looks like cellulitis again. He is insensate the erythema is really very brilliant.o Erysipelas He went to see an allergist days ago because he was itching part of this he had lab work done. This showed [name] white count of 15.1 with 70% neutrophils. Hemoglobin of 11.4 and [name] platelet count of 659,000. Last white count we had in Epic was [name] 2-1/2 years ago which was 25.9 but he was ill at the time. He was able to show me some lab work that was done by his primary physician the pattern is about the same. 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 But prompted me to go ahead and do x-rays of both feet and the pelvis rule out osteomyelitis. He also had [name] comprehensive metabolic panel this was reasonably normal his albumin was 3.7 liver function tests BUN/creatinine all normal 05/23/18; x-rays of both his feet from last week were negative for underlying pulmonary abnormality. The x-ray of his pelvis however showed mild irregularity in the left ischial which may represent some early osteomyelitis. The wound in the left ischial continues to get deeper clearly now exposed muscle. Each week necrotic surface material over this area. Whereas the rest  of the wounds do not look so bad. The left ischial wound we have been using Santyl and calcium alginate T the left heel surface necrotic debris using Iodoflex o The left lateral leg is still healed Areas on the left dorsal foot and the right dorsal foot are about the same. There is some inflammation on the left which might represent contact dermatitis, fungal dermatitis I am doubtful cellulitis although this looks better than last week 05/30/18; CT scan done at Hospital did not  show any osteomyelitis or abscess. Suggested the possibility of underlying cellulitis although I don't see [name] lot of evidence of this at the bedside The wound itself on the left buttock/upper thigh actually looks somewhat better. No debridement Left heel also looks better no debridement continue Iodoflex Both dorsal first second toe spaces appear better using Lotrisone. Left still required debridement 06/06/18; Intake reported some purulent looking drainage from the left gluteal wound. Using Santyl and calcium alginate Left heel looks better although still [name] nonviable surface requiring debridement The left dorsal foot first/second webspace actually expanding and somewhat deeper. I may consider doing [name] shave biopsy of this area Right dorsal foot first/second webspace appears stable to improved. Using Lotrisone and silver alginate to both these areas 06/13/18 Left gluteal surface looks better. Now separated in the 2 wounds. No debridement required. Still drainage. We'll continue silver alginate Left heel continues to look better with Iodoflex continue this for at least another week Of his dorsal foot wounds the area on the left still has some depth although it looks better than last week. We've been using Lotrisone and silver alginate 06/20/18 Left gluteal continues to look better healthy tissue Left heel continues to look better healthy granulation wound is smaller. He is using Iodoflex and his long as this continues continue the Iodoflex Dorsal right foot looks better unfortunately dorsal left foot does not. There is swelling and erythema of his forefoot. He had minor trauma to this several days ago but doesn't think this was enough to have caused any tissue injury. Foot looks like cellulitis, we have had this problem before 06/27/18 on evaluation today patient appears to be doing [name] little worse in regard to his foot ulcer. 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 Both of which would really be his only options for treating this infection. In the past he has been given and effusion of Orbactiv. This is done very well for him in the past again it's one time dosing IV antibiotic therapy. Subsequently I do believe this is something we're gonna need to see about doing at this point in time. Currently his other wounds seem to be doing somewhat better in my pinion I'm pretty happy in that regard. 07/03/18 on evaluation today patient's wounds actually appear to be doing fairly well. He has been tolerating the dressing changes without complication. All in all he seems to be showing signs of improvement. In regard to the antibiotics he has been dealing with infectious disease since I saw him last week as far as getting this scheduled. In the end [facility]'s [facility] this coming Friday. In the meantime he has been continuing to perform the dressing changes in such as previous. There does not appear to be any evidence of infection worsengin at this time. 07/10/18;  Since I last saw this man 2 weeks ago things have actually improved. IV antibiotics of resulted in less forefoot erythema although there is still some present. He is not systemically unwell Left buttock wounds 2 now have no depth there is increased epithelialization Using silver alginate Left heel still requires debridement using Iodoflex Left dorsal foot still with [name] sizable wound about the size of [name] border but healthy granulation Right dorsal foot still with [name] slitlike area using silver alginate 07/18/18; the patient's cellulitis in the left foot is improved in fact I think it is on its way to resolving. Left buttock wounds 2 both look better although the larger one has hypertension granulation we've been using silver alginate Left heel has some thick  circumferential redundant skin over the wound edge which will need to be removed today we've been using Iodoflex Left dorsal foot is still [name] sizable wound required debridement using silver alginate The right dorsal foot is just about closed only [name] small open area remains here 07/25/18; left foot cellulitis is resolved Left buttock wounds 2 both look better. Hyper-granulation on the major area Left heel as some debris over the surface but otherwise looks [name] healthier wound. Using silver collagen Right dorsal foot is just about closed 07/31/18; arrives with our intake nurse worried about purulent drainage from the buttock. We had hyper-granulation here last week His buttock wounds 2 continue to look better Left heel some debris over the surface but measuring smaller. Right dorsal foot unfortunately has openings between the toes Left foot superficial wound looks less aggravated. 08/07/18 Buttock wounds continue to look better although some of her granulation and the larger medial wound. silver alginate Left heel continues to look [name] lot better.silver collagen Left foot superficial wound looks less stable. Requires debridement. He has [name] new wound superficial area on the foot on the lateral dorsal foot. Right foot looks better using silver alginate without Lotrisone 08/14/2018; patient was in the ER last week diagnosed with [name] UTI. He is now on Cefpodoxime and Macrodantin. Buttock wounds continued to be smaller. Using silver alginate Left heel continues to look better using silver collagen Left foot superficial wound looks as though it is improving Right dorsal foot area is just about healed. 08/21/2018; patient is completed his antibiotics for his UTI. He has 2 open areas on the buttocks. There is still not closed although the surface looks satisfactory. Using silver alginate Left heel continues to improve using silver collagen The bilateral dorsal foot areas which are at the base of his first and second  toes/possible tinea pedis are actually stable on the left but worse on the right. The area on the left required debridement of necrotic surface. After debridement I obtained [name] specimen for PCR culture. The right dorsal foot which is been just about healed last week is now reopened 08/28/2018; culture done on the left dorsal foot showed coag negative staph both staph epidermidis and Lugdunensis. I think this is worthwhile initiating systemic treatment. I will use doxycycline given his long list of allergies. The area on the left heel slightly improved but still requiring debridement. The large wound on the buttock is just about closed whereas the smaller one is larger. Using silver alginate in this area 09/04/2018; patient is completing his doxycycline for the left foot although this continues to be [name] very difficult wound area with very adherent necrotic debris. We are using silver alginate to all his wounds right foot left foot and the small wounds on his  buttock, silver collagen on the left heel. 09/11/2018; once again this patient has intense erythema and swelling of the left forefoot. Lesser degrees of erythema in the right foot. He has [name] long list of allergies and intolerances. I will reinstitute doxycycline. 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 Using silver alginate Left heel also looks better using silver collagen Unfortunately both the areas on his feet look worse. The area on the left first second webspace is now gone through to the plantar part of his foot. The area on the left foot anteriorly is irritated with erythema and swelling in the forefoot. 09/25/2018 His wound on the left plantar heel looks better. Using silver collagen The area on the left buttock 2 small remnant areas. One is closed one is still open. Using silver alginate The areas between both his first and second toes look worse. This in spite of long-standing antifungal  therapy with ketoconazole and silver alginate which should have antifungal activity He has small areas around his original wound on the left calf one is on the bottom of the original scar tissue and one superiorly both of these are small and superficial but again given wound history in this site this is worrisome 10/02/2018 Left plantar heel continues to gradually contract using silver collagen Left buttock wound is unchanged using silver alginate The areas on his dorsal feet between his first and second toes bilaterally look about the same. I prescribed clindamycin ointment to see if we can address chronic staph colonization and also the underlying possibility of erythrasma The left lateral lower extremity wound is actually on the lateral part of his ankle. Small open area here. We have been using silver alginate 10/09/2018; Left plantar heel continues to look healthy and contract. No debridement is required Left buttock slightly smaller with [name] tape injury wound just below which was new this week Dorsal feet somewhat improved I have been using clindamycin Left lateral looks lower extremity the actual open area looks worse although [name] lot of this is epithelialized. I am going to change to silver collagen today He has [name] lot more swelling in the right leg although this is not pitting not red and not particularly warm there is [name] lot of spasm in the right leg usually indicative of people with paralysis of some underlying discomfort. We have reviewed his vascular status from 2017 he had [name] left greater saphenous vein ablation. I wonder about referring him back to vascular surgery if the area on the left leg continues to deteriorate. 10/16/2018 in today for follow-up and management of multiple lower extremity ulcers. His left Buttock wound is much lower smaller and almost closed completely. The wound to the left ankle has began to reopen with Epithelialization and some adherent slough. He has multiple  new areas to the left foot and leg. The left dorsal foot without much improvement. Wound present between left great webspace and 2nd toe. Erythema and edema present right leg. Right LE ultrasound obtained on 10/10/18 was negative for DVT . 10/23/2018; Left buttock is closed over. Still dry macerated skin but there is no open wound. I suspect this is chronic pressure/moisture Left lateral calf is quite [name] bit worse than when I saw this last. There is clearly drainage here he has macerated skin into the left plantar heel. We will change the primary dressing to alginate Left dorsal foot has some improvement in overall wound area. Still using clindamycin and  silver alginate Right dorsal foot about the same as the left using clindamycin and silver alginate The erythema in the right leg has resolved. He is DVT rule out was negative Left heel pressure area required debridement although the wound is smaller and the surface is health 10/26/2018 The patient came back in for his nurse check today predominantly because of the drainage coming out of the left lateral leg with [name] recent reopening of his original wound on the left lateral calf. He comes in today with [name] large amount of surrounding erythema around the wound extending from the calf into the ankle and even in the area on the dorsal foot. He is not systemically unwell. He is not febrile. Nevertheless this looks like cellulitis. We have been using silver alginate to the area. I changed him to [name] regular visit and I am going to prescribe him doxycycline. The rationale here is [name] long list of medication intolerances and [name] history of MRSA. I did not see anything that I thought would provide [name] valuable culture 10/30/2018 Follow-up from his appointment 4 days ago with really an extensive area of cellulitis in the left calf left lateral ankle and left dorsal foot. I put him on doxycycline. He has [name] long list of medication allergies which are true allergy reactions.  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 In any case he is [name] lot better today. The erythema especially in the anterior and lateral left calf is better. He still has left ankle erythema. He also is complaining about increasing edema in the right leg we have only been using Kerlix Coban and he has been doing the wraps at home. Finally he has [name] spotty rash on the medial part of his upper left calf which looks like folliculitis or perhaps wrap occlusion type injury. Small superficial macules not pustules 11/06/18 patient arrives today with again [name] considerable degree of erythema around the wound on the left lateral calf extending into the dorsal ankle and dorsal foot. This is [name] lot worse than when I saw this last week. He is on doxycycline really with not [name] lot of improvement. He has not been systemically unwell Wounds on the; left heel actually looks improved. Original area on the left foot and proximity to the first and second toes looks about the same. He has superficial areas on the dorsal foot, anterior calf and then the reopening of his original wound on the left lateral calf which looks about the same The only area he has on the right is the dorsal webspace first and second which is smaller. He has [name] large area of dry erythematous skin on the left buttock small open area here. 11/13/2018; the patient arrives in much better condition. The erythema around the wound on the left lateral calf is [name] lot better. Not sure whether this was the clindamycin or the TCA and ketoconazole or just in the improvement in edema control [stasis dermatitis]. In any case this is [name] lot better. The area on the left heel is very small and just about resolved using silver collagen we have been using silver alginate to the areas on his dorsal feet 11/20/2018; his wounds include the left lateral calf, left heel, dorsal aspects of both feet just proximal to  the first second webspace. He is stable to slightly improved. I did not think any changes to his dressings were going to be necessary 11/27/2018 he has [name] reopening  on the left buttock which is surrounded by what looks like tinea or perhaps some other form of dermatitis. The area on the left dorsal foot has some erythema around it I have marked this area but I am not sure whether this is cellulitis or not. Left heel is not closed. Left calf the reopening is really slightly longer and probably worse 1/13; in general things look better and smaller except for the left dorsal foot. Area on the left heel is just about closed, left buttock looks better only [name] small wound remains in the skin looks better [using Lotrisone] 1/20; the area on the left heel only has [name] few remaining open areas here. Left lateral calf about the same in terms of size, left dorsal foot slightly larger right lateral foot still not closed. The area on the left buttock has no open wound and the surrounding skin looks [name] lot better 1/27; the area on the left heel is closed. Left lateral calf better but still requiring extensive debridements. The area on his left buttock is closed. He still has the open areas on the left dorsal foot which is slightly smaller in the right foot which is slightly expanded. We have been using Iodoflex on these areas as well 2/3; left heel is closed. Left lateral calf still requiring debridement using Iodoflex there is no open area on his left buttock however he has dry scaly skin over [name] large area of this. Not really responding well to the Lotrisone. Finally the areas on his dorsal feet at the level of the first second webspace are slightly smaller on the right and about the same on the left. Both of these vigorously debrided with Anasept and gauze 2/10; left heel remains closed he has dry erythematous skin over the left buttock but there is no open wound here. Left lateral leg has come in and with.  Still requiring debridement we have been using Iodoflex here. Finally the area on the left dorsal foot and right dorsal foot are really about the same extremely dry callused fissured areas. He does not yet have [name] dermatology appointment 2/17; left heel remains closed. He has [name] new open area on the left buttock. The area on the left lateral calf is bigger longer and still covered in necrotic debris. No major change in his foot areas bilaterally. I am awaiting for [name] dermatologist to look on this. We have been using ketoconazole I do not know that this is been doing any good at all. 2/24; left heel remains closed. The left buttock wound that was new reopening last week looks better. The left lateral calf appears better also although still requires debridement. The major area on his foot is the left first second also requiring debridement. We have been putting Prisma on all wounds. I do not believe that the ketoconazole has done too much good for his feet. He will use Lotrisone I am going to give him [name] 2-week course of terbinafine. We still do not have [name] dermatology appointment 3/2 left heel remains closed however there is skin over bone in this area I pointed this out to him today. The left buttock wound is epithelialized but still does not look completely stable. The area on the left leg required debridement were using silver collagen here. With regards to his feet we changed to Lotrisone last week and silver alginate. 3/9; left heel remains closed. Left buttock remains closed. The area on the right foot is essentially closed. The left foot remains  unchanged. Slightly smaller on the left lateral calf. Using silver collagen to both of these areas 3/16-Left heel remains closed. Area on right foot is closed. Left lateral calf above the lateral malleolus open wound requiring debridement with easy bleeding. Left dorsal wound proximal to first toe also debrided. Left ischial area open new. Patient has been  using Prisma with wrapping every 3 days. Dermatology appointment is apparently tomorrow.Patient has completed his terbinafine 2-week course with some apparent improvement according to him, there is still flaking and dry skin in his foot on the left 3/23; area on the right foot is reopened. The area on the left anterior foot is about the same still [name] very necrotic adherent surface. He still has the area on the left leg and reopening is on the left buttock. He apparently saw dermatology although I do not have [name] note. According to the patient who is usually fairly well informed they did not have any good ideas. Put him on oral terbinafine which she is been on before. 3/30; using silver collagen to all wounds. Apparently his dermatologist put him on doxycycline and rifampin presumably some culture grew staph. I do not have this result. He remains on terbinafine although I have used terbinafine on him before 4/6; patient has had [name] fairly substantial reopening on the right foot between the first and second toes. He is finished his terbinafine and I believe is on doxycycline and rifampin still as prescribed by dermatology. We have been using silver collagen to all his wounds although the patient reports that he thinks silver alginate does better on the wounds on his buttock. 4/13; the area on his left lateral calf about the same size but it did not require debridement. Left dorsal foot just proximal to the webspace between the first and second toes is about the same. Still nonviable surface. I note some superficial bronze discoloration of the dorsal part of his foot Right dorsal foot just proximal to the first and second toes also looks about the same. I still think there may be the same discoloration I noted above on the left Left buttock wound looks about the same 4/20; left lateral calf appears to be gradually contracting using silver collagen. He remains on erythromycin empiric treatment for possible  erythrasma involving his digital spaces. The left dorsal foot wound is debrided of tightly adherent necrotic debris and really cleans up quite nicely. The right area is worse with expansion. I did not debride this it is now over the base of the second toe The area on his left buttock is smaller no debridement is required using silver collagen 5/4; left calf continues to make good progress. He arrives with erythema around the wounds on his dorsal foot which even extends to the plantar aspect. Very concerning for coexistent infection. He is finished the erythromycin I gave him for possible erythrasma this does not seem to have helped. The area on the left foot is about the same base of the dorsal toes Is area on the buttock looks improved on the left 5/11; left calf and left buttock continued to make good progress. Left foot is about the same to slightly improved. Major problem is on the right foot. He has not had an x-ray. Deep tissue culture I did last week showed both Enterobacter and E. coli. I did not change the doxycycline I put him on empirically although neither 1 of these were plated to doxycycline. He arrives today with the erythema looking worse on both the  dorsal and plantar foot. Macerated skin on the bottom of the foot. he has not been systemically unwell 5/18-Patient returns at 1 week, left calf wound appears to be making some progress, left buttock wound appears slightly worse than last time, left foot wound looks slightly better, right foot redness is marginally better. X-ray of both feet show no air or evidence of osteomyelitis. Patient is finished his Omnicef and terbinafine. He continues to have macerated skin on the bottom of the left foot as well as right 5/26; left calf wound is better, left buttock wound appears to have multiple small superficial open areas with surrounding macerated skin. X-rays that I did last time showed no evidence of osteomyelitis in either foot. He is  finished cefdinir and doxycycline. I do not think that he was on terbinafine. He continues to have [name] large superficial open area on the right foot anterior dorsal and slightly between the first and second toes. I did send him to dermatology 2 months ago or so wondering about whether they would do [name] fungal scraping. I do not believe they did but did do [name] culture. We have been using silver alginate to the toe areas, he has been using antifungals at home topically either ketoconazole or Lotrisone. We are using silver collagen on the left foot, silver alginate on the right, silver collagen on the left lateral leg and silver alginate on the left buttock 6/1; left buttock area is healed. We have the left dorsal foot, left lateral leg and right dorsal foot. We are using silver alginate to the areas on both feet and silver collagen to the area on his left lateral calf 6/8; the left buttock apparently reopened late last week. He is not really sure how this happened. He is tolerating the terbinafine. Using silver alginate to all wounds 6/15; left buttock wound is larger than last week but still superficial. Came in the clinic today with [name] report of purulence from the left lateral leg I did not identify any infection Both areas on his dorsal feet appear to be better. He is tolerating the terbinafine. Using silver alginate to all wounds 6/22; left buttock is about the same this week, left calf quite [name] bit better. His left foot is about the same however he comes in with erythema and warmth in the right forefoot once again. Culture that I gave him in the beginning of May showed Enterobacter and E. coli. I gave him doxycycline and things seem to improve although neither 1 of these organisms was specifically plated. 6/29; left buttock is larger and dry this week. Left lateral calf looks to me to be improved. Left dorsal foot also somewhat improved right foot completely unchanged. The erythema on the right foot is  still present. He is completing the Ceftin dinner that I gave him empirically [see discussion above.) 7/6 - All wounds look to be stable and perhaps improved, the left buttock wound is slightly smaller, per patient bleeds easily, completed ceftin, the right foot redness is less, he is on terbinafine 7/13; left buttock wound about the same perhaps slightly narrower. Area on the left lateral leg continues to narrow. Left dorsal foot slightly smaller right foot about the same. We are using silver alginate on the right foot and Hydrofera Blue to the areas on the left. Unna boot on the left 2 layer compression on the right 7/20; left buttock wound absolutely the same. Area on lateral leg continues to get better. Left dorsal foot require debridement as  did the right no major change in the 7/27; left buttock wound the same size necrotic debris over the surface. The area on the lateral leg is closed once again. His left foot looks better right foot about the same although there is some involvement now of the posterior first second toe area. He is still on terbinafine which I have given him for [name] month, not certain [name] centimeter major change 06/25/19-All wounds appear to be slightly improved according to report, left buttock wound looks clean, both foot wounds have minimal to no debris the right dorsal foot has minimal slough. We are using Hydrofera Blue to the left and silver alginate to the right foot and ischial wound. 8/10-Wounds all appear to be around the same, the right forefoot distal part has some redness which was not there before, however the wound looks clean and small. Ischial wound looks about the same with no changes 8/17; his wound on the left lateral calf which was his original chronic venous insufficiency wound remains closed. Since I last saw him the areas on the left dorsal foot right dorsal foot generally appear better but require debridement. The area on his left initial tuberosity appears  somewhat larger to me perhaps hyper granulated and bleeds very easily. We have been using Hydrofera Blue to the left dorsal foot and silver alginate to everything else 8/24; left lateral calf remains closed. The areas on his dorsal feet on the webspace of the first and second toes bilaterally both look better. The area on the left buttock which is the pressure ulcer stage II slightly smaller. I change the dressing to Hydrofera Blue to all areas 8/31; left lateral calf remains closed. The area on his dorsal feet bilaterally look better. Using Hydrofera Blue. Still requiring debridement on the left foot. No change in the left buttock pressure ulcers however 9/14; left lateral calf remains closed. Dorsal feet look quite [name] bit better than 2 weeks ago. Flaking dry skin also [name] lot better with the ammonium lactate I gave him 2 weeks ago. The area on the left buttock is improved. He states that his Roho cushion developed [name] leak and he is getting [name] new one, in the interim he is offloading this vigorously 9/21; left calf remains closed. Left heel which was [name] possible DTI looks better this week. He had macerated tissue around the left dorsal foot right foot looks satisfactory and improved left buttock wound. I changed his dressings to his feet to silver alginate bilaterally. Continuing Hydrofera Blue on the left buttock. 9/28 left calf remains closed. Left heel did not develop anything [possible DTI] dry flaking skin on the left dorsal foot. Right foot looks satisfactory. Improved left buttock wound. We are using silver alginate on his feet Hydrofera Blue on the buttock. I have asked him to go back to the Lotrisone on his feet including the wounds and surrounding areas 10/5; left calf remains closed. The areas on the left and right feet about the same. [name] lot of this is epithelialized however debris over the remaining open areas. He is using Lotrisone and silver alginate. The area on the left buttock using  Hydrofera Blue 10/26. Patient has been out for 3 weeks secondary to Covid concerns. He tested negative but I think his wife tested positive. He comes in today with the left foot substantially worse, right foot about the same. Even more concerning he states that the area on his left buttock closed over but then reopened and is considerably deeper  in one aspect than it was before [stage III wound] 11/2; left foot really about the same as last week. Quarter sized wound on the dorsal foot just proximal to the first second toes. Surrounding erythema with areas of denuded epithelium. This is not really much different looking. Did not look like cellulitis this time however. Right foot area about the same.. We have been using silver alginate alginate on his toes Left buttock still substantial irritated skin around the wound which I think looks somewhat better. We have been using Hydrofera Blue here. 11/9; left foot larger than last week and [name] very necrotic surface. Right foot I think is about the same perhaps slightly smaller. Debris around the circumference also addressed. Unfortunately on the left buttock there is been [name] decline. Satellite lesions below the major wound distally and now [name] an additional one posteriorly we have been using Hydrofera Blue but I think this is [name] pressure issue 11/16; left foot ulcer dorsally again [name] very adherent necrotic surface. Right foot is about the same. Not much change in the pressure ulcer on his left buttock. 11/30; left foot ulcer dorsally basically the same as when I saw him 2 weeks ago. Very adherent fibrinous debris on the wound surface. Patient reports [name] lot of drainage as well. 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 We have been using Iodoflex, patient changed back to alginate because of the drainage. Area on his right dorsal foot really looks benign with [name] healthier surface certainly [name] lot better than on the left.  Left buttock wounds all improved using Hydrofera Blue 12/7; left dorsal foot again no improvement. Tightly adherent debris. PCR culture I did last week only showed likely skin contaminant. I have gone ahead and done [name] punch biopsy of this which is about the last thing in terms of investigations I can think to do. He has known venous insufficiency and venous hypertension and this could be the issue here. The area on the right foot is about the same left buttock slightly worse according to our intake nurse secondary to [facility] Blue sticking to the wound 12/14; biopsy of the left foot that I did last time showed changes that could be related to wound healing/chronic stasis dermatitis phenomenon no neoplasm. We have been using silver alginate to both feet. I change the one on the left today to Sorbact and silver alginate to his other 2 wounds 12/28; the patient arrives with the following problems; Major issue is the dorsal left foot which continues to be [name] larger deeper wound area. Still with [name] completely nonviable surface Paradoxically the area mirror image on the right on the right dorsal foot appears to be getting better. He had some loss of dry denuded skin from the lower part of his original wound on the left lateral calf. Some of this area looked [name] little vulnerable and for this reason we put him in wrap that on this side this week The area on his left buttock is larger. He still has the erythematous circular area which I think is [name] combination of pressure, sweat. This does not look like cellulitis or fungal dermatitis 11/26/2019; -Dorsal left foot large open wound with depth. Still debris over the surface. Using Sorbact The area on the dorsal right foot paradoxically has closed over He has [name] reopening on the left ankle laterally at the base of his original wound that extended up into the calf. This appears clean. The left buttock  wound is smaller but with very adherent necrotic debris over the  surface. We have been using silver alginate here as well The patient had arterial studies done in 2017. He had biphasic waveforms at the dorsalis pedis and posterior tibial bilaterally. ABI in the left was 1.17. Digit waveforms were dampened. He has slight spasticity in the great toes I do not think [name] TBI would be possible 1/11; the patient comes in today with [name] sizable reopening between the first and second toes on the right. This is not exactly in the same location where we have been treating wounds previously. According to our intake nurse this was actually fairly deep but 0.6 cm. The area on the left dorsal foot looks about the same the surface is somewhat cleaner using Sorbact, his MRI is in 2 days. We have not managed yet to get arterial studies. The new reopening on the left lateral calf looks somewhat better using alginate. The left buttock wound is about the same using alginate 1/18; the patient had his ARTERIAL studies which were quite normal. ABI in the right at 1.13 with triphasic/biphasic waveforms on the left ABI 1.06 again with triphasic/biphasic waveforms. It would not have been possible to have done [name] toe brachial index because of spasticity. We have been using Sorbac to the left foot alginate to the rest of his wounds on the right foot left lateral calf and left buttock 1/25; arrives in clinic with erythema and swelling of the left forefoot worse over the first MTP area. This extends laterally dorsally and but also posteriorly. Still has an area on the left lateral part of the lower part of his calf wound it is eschared and clearly not closed. Area on the left buttock still with surrounding irritation and erythema. Right foot surface wound dorsally. The area between the right and first and second toes appears better. 2/1; The left foot wound is about the same. Erythema slightly better I gave him [name] week of doxycycline empirically Right foot wound is more extensive extending between  the toes to the plantar surface Left lateral calf really no open surface on the inferior part of his original wound however the entire area still looks vulnerable Absolutely no improvement in the left buttock wound required debridement. 2/8; the left foot is about the same. Erythema is slightly improved I gave him clindamycin last week. Right foot looks better he is using Lotrimin and silver alginate He has [name] breakdown in the left lateral calf. Denuded epithelium which I have removed Left buttock about the same were using Hydrofera Blue 2/15; left foot is about the same there is less surrounding erythema. Surface still has tightly adherent debris which I have debriding however not making any progress Right foot has [name] substantial wound on the medial right second toe between the first and second webspace. Still an open area on the left lateral calf distal area. Buttock wound is about the same 2/22; left foot is about the same less surrounding erythema. Surface has adherent debris. Polymen Ag Right foot area significant wound between the first and second toes. We have been using silver alginate here Left lateral leg polymen Ag at the base of his original venous insufficiency wound Left buttock some improvement here 3/1; Right foot is deteriorating in the first second toe webspace. Larger and more substantial. We have been using silver alginate. Left dorsal foot about the same markedly adherent surface debris using PolyMem Ag Left lateral calf surface debris using PolyMem AG Left buttock  is improved again using PolyMem Ag. He is completing his terbinafine. The erythema in the foot seems better. He has been on this for 2 weeks 3/8; no improvement in any wound area in fact he has [name] small open area on the dorsal midfoot which is new this week. He has not gotten his foot x-rays yet 3/15; his x-rays were both negative for osteomyelitis of both feet. No major change in any of his wounds on the  extremities however his buttock wounds are better. We have been using polymen on the buttocks, left lower leg. Iodoflex on the left foot and silver alginate on the right 3/22; arrives in clinic today with the 2 major issues are the improvement in the left dorsal foot wound which for once actually looks healthy with [name] nice healthy wound surface without debridement. Using Iodoflex here. Unfortunately on the left lateral calf which is in the distal part of his original wound he came to the clinic here for there was purulent drainage noted some increased breakdown scattered around the original area and [name] small area proximally. We we are using polymen here will change to silver alginate today. His buttock wound on the left is better and I think the area on the right first second toe webspace is also improved 3/29; left dorsal foot looks better. Using Iodoflex. Left ankle culture from deterioration last time grew E. coli, Enterobacter and Enterococcus. I will give him [name] course of cefdinir although that will not cover Enterococcus. The area on the right foot in the webspace of the first and second toe lateral first toe looks better. The area on his buttock is about healed Vascular appointment is on April 21. This is to look at his venous system vis--vis continued breakdown of the wounds on the left including the left lateral leg and left dorsal foot he. He has had previous ablations on this side 4/5; the area between the right first and second toes lateral aspect of the first toe looks better. Dorsal aspect of the left first toe on the left foot also improved. Unfortunately the left lateral lower leg is larger and there is [name] second satellite wound superiorly. The usual superficial abrasions on the left buttock overall better but certainly not closed 4/12; the area between the right first and second toes is improved. Dorsal aspect of the left foot also slightly smaller with [name] vibrant healthy looking  surface. No real change in the left lateral leg and the left buttock wound is healed He has an unaffordable co-pay for Apligraf. Appointment with vein and vascular with regards to the left leg venous part of the circulation is on 4/21 4/19; we continue to see improvement in all wound areas. Although this is minor. He has his vascular appointment on 4/21. The area on the left buttock has not reopened although right in the center of this area the skin looks somewhat threatened 4/26; the left buttock is unfortunately reopened. In general his left dorsal foot has [name] healthy surface and looks somewhat smaller although it was not measured as such. The area between his first and second toe webspace on the right as [name] small wound against the first toe. The patient saw vascular surgery. The real question I was asking was about the small saphenous vein on the left. He has previously ablated left greater saphenous vein. Nothing further was commented on on the left. Right greater saphenous vein without reflux at the saphenofemoral junction or proximal thigh there was no indication for  ablation of the right greater saphenous vein duplex was negative for DVT bilaterally. They did not think there was anything from [name] vascular surgery point of view that could be offered. They ABIs within normal limits 5/3; only small open area on the left buttock. The area on the left lateral leg which was his original venous reflux is now 2 wounds both which look clean. We are using Iodoflex on the left dorsal foot which looks healthy and smaller. He is down to [name] very tiny area between the right first and second toes, using silver alginate 5/10; all of his wounds appear better. We have much better edema control in 4 layer compression on the left. This may be the factor that is allowing the left foot and left lateral calf to heal. He has external compression garments at home 04/14/20-All of his wounds are progressing well, the left  forefoot is practically closed, left ischium appears to be about the same, right toe webspace is also smaller. The left lateral leg is about the same, continue using Hydrofera Blue to this, silver alginate to the ischium, Iodoflex to the toe space on the right 6/7; most of his wounds outside of the left buttock are doing well. The area on the left lateral calf and left dorsal foot are smaller. The area on the right foot in between the first and second toe webspace is barely visible although he still says there is some drainage here is the only reason I did not heal this out. Unfortunately the area on the left buttock almost looks like he has [name] skin tear from tape. He has open wound and then [name] large flap of skin that we are trying to get adherence over an area just next to the remaining wound 6/21; 2 week follow-up. I believe is been here for nurse visits. Miraculously the area between his first and second toes on the left dorsal foot is closed over. Still open on the right first second web space. The left lateral calf has 2 open areas. Distally this is more superficial. The proximal area had [name] little more depth and required debridement of adherent necrotic material. His buttock wound is actually larger we have been using silver alginate here 6/28; the patient's area on the left foot remains closed. Still open wet area between the first and second toes on the right and also extending into the plantar aspect. We have been using silver alginate in this location. He has 2 areas on the left lower leg part of his original long wounds which I think are better. We have been using Hydrofera Blue here. Hydrofera Blue to the left buttock which is stable 7/12; left foot remains closed. Left ankle is closed. May be [name] small area between his right first and second toes the only truly open area is on the left buttock. We have been using Hydrofera Blue here 7/19; patient arrives with marked deterioration especially in  the left foot and ankle. We did not put him in [name] compression wrap on the left last week in fact he wore his juxta lite stockings on either side although he does not have an underlying stocking. He has [name] reopening on the left dorsal foot, left lateral ankle and [name] new area on the right dorsal ankle. More worrisome is the degree of erythema on the left foot extending on the lateral foot into the lateral lower leg on the left 7/26; the patient had erythema and drainage from the lateral left ankle last  week. Culture of this grew MRSA resistant to doxycycline and clindamycin which are the 2 antibiotics we usually use with this patient who has multiple antibiotic allergies including linezolid, trimethoprim sulfamethoxazole. I had give him an empiric doxycycline and he comes in the area certainly looks somewhat better although it is blotchy in his lower leg. He has not been systemically unwell. He has had areas on the left dorsal foot which is [name] reopening, chronic wounds on the left lateral ankle. Both of these I think are secondary to chronic venous insufficiency. The area between his first and second toes is closed as far as I can tell. He had [name] new wrap injury on the right dorsal ankle last week. Finally he has an area on the left buttock. We have been using silver alginate to everything except the left buttock we are using Hydrofera Blue 06/30/20-Patient returns at 1 week, has been given [name] sample dose pack of NUZYRA which is [name] tetracycline derivative [omadacycline], patient has completed those, we have been using silver alginate to almost all the wounds except the left ischium where we are using Hydrofera Blue all of them look better 8/16; since I last saw the patient he has been doing well. The area on the left buttock, left lateral ankle and left foot are all closed today. He has completed the [place] I gave him last time and tolerated this well. He still has open areas on the right dorsal ankle and in the  right first second toe area which we are using silver alginate. 8/23; we put him in his bilateral external compression stockings last week as he did not have anything open on either leg except for concerning area between the right first and second toe. He comes in today with an area on the left dorsal foot slightly more proximal than the original wound, the left lateral foot but this is actually [name] continuation of the area he had on the left lateral ankle from last time. As well he is opened up on the left buttock again. 8/30; comes in today with things looking [name] lot better. The area on the left lower ankle has closed down as has the left foot but with eschar in both areas. The area on the dorsal right ankle is also epithelialized. Very little remaining of the left buttock wound. We have been using silver alginate on all wound areas 9/13; the area in the first second toe webspace on the right has fully epithelialized. He still has some vulnerable epithelium on the right and the ankle and the dorsal foot. He notes weeping. He is using his juxta lite stocking. On the left again the left dorsal foot is closed left lateral ankle is closed. We went to the juxta lite stocking here as well. Still vulnerable in the left buttock although only 2 small open areas remain here 9/27; 2-week follow-up. We did not look at his left leg but the patient says everything is closed. He is [name] bit disturbed by the amount of edema in his left foot he is using juxta lite stockings but asking about over the toes stockings which would be 30/40, will talk to him next time. According to him there is no open wound on either the left foot or the left ankle/calf He has an open area on the dorsal right calf which I initially point [name] wrap injury. He has superficial remaining wound on the left ischial tuberosity been using silver alginate although he says this sticks to the  wound 10/5; we gave him 2-week follow-up but he called yesterday  expressing some concerns about his right foot right ankle and the left buttock. He came in early. There is still no open areas on the left leg and that still in his juxta lite stocking 10/11; he only has 1 small area on the left buttock that remains measuring millimeters 1 mm. Still has the same irritated skin in this area. We recommended zinc oxide when this eventually closes and pressure relief is meticulously is he can do this. He still has an area on the dorsal part of his right first through third toes which is [name] bit irritated and still open and on the dorsal ankle near the crease of the ankle. We have been using silver alginate and using his own stocking. He has nothing open on the left leg or foot 10/25; 2-week follow-up. Not nearly as good on the left buttock as I was hoping. For open areas with 5 looking threatened small. He has the erythematous irritated chronic skin in this area. 1 area on the right dorsal ankle. He reports this area bleeds easily Right dorsal foot just proximal to the base of his toes We have been using silver alginate. 11/8; 2-week follow-up. Left buttock is about the same although I do not think the wounds are in the same location we have been using silver alginate. I have asked him to use zinc oxide on the skin around the wounds. He still has [name] small area on the right dorsal ankle he reports this bleeds easily Right dorsal foot just proximal to the base of the toes does not have anything open although the skin is very dry and scaly He has [name] new opening on the nailbed of the left great toe. Nothing on the left ankle 11/29; 3-week follow-up. Left buttock has 2 open areas. And washing of these wounds today started bleeding easily. Suggesting very friable tissue. We have been using silver alginate. Right dorsal ankle which I thought was initially [name] wrap injury we have been using silver alginate. Nothing open between the toes that I can see. He states the area on the left  dorsal toe nailbed healed after the last visit in 2 or 3 days 12/13; 3-week follow-up. His left buttock now has 3 open areas but the original 2 areas are smaller using polymen here. Surrounding skin looks better. The right dorsal ankle is closed. He has [name] small opening on the right dorsal foot at the level of the third toe. In general the skin looks better here. He is wearing his juxta lite stocking on the left leg says there is nothing open 11/24/2020; 3 weeks follow-up. His left buttock still has the 3 open areas. We have been using polymen but due to lack of response he changed to [facility] area. Surrounding skin is dry erythematous and irritated looking. There is no evidence of infection either bacterial or fungal however there is loss of surface epithelium He still has very dry skin in his foot causing irritation and erythema on the dorsal part of his toes. This is not responded to prolonged courses of antifungal simply looks dry and irritated 1/24; left buttock area still looks about the same he was unable to find the triad ointment that we had suggested. The area on the right lower leg just above the dorsal ankle has reopened and the areas on the right foot between the first second and second third toes and scaling on the bottom of  the foot has been about the same for quite some time now. been using silver alginate to all wound areas 2/7; left buttock wound looked quite good although not much smaller in terms of surface area surrounding skin looks better. Only [name] few dry flaking areas on the right foot in between the first and second toes the skin generally looks better here [ammonium lactate]. Finally the area on the right dorsal ankle is closed 2/21; There is no open area on the right foot even between the right first and second toe. Skin around this area dorsally and plantar aspects look better. He has [name] reopening of the area on the right ankle just above the crease of the ankle dorsally.  I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 history to this. This may need to be biopsied. He has the arterial and venous studies we ordered last week ordered for March 01/16/16; the patient's 2 wounds on his right leg on the anterior leg and Achilles area are both healed. [name] with very adherent necrotic eschar and slough on the lateral aspect of his left leg in 2 areas and also raised area over the left Achilles. We put Santyl on this last week and left him in [name] rapid. He says the drainage went through. He has some Kerlix Coban and in some Profore at home I have therefore written him [name] prescription for Santyl and he can change this at home on his own. 01/23/16; the original 2 wounds on the right leg are apparently still closed. [name] on his left lateral leg in 2 spots the superior one much larger than the inferior one. He also has [name] raised area on the left Achilles. We have been putting Santyl and all of these wounds. His wife is changing this at home one time this week although she may be able to do this more frequently. 01/30/16 no open wounds on the right leg. [name] on the left lateral leg in 2 spots and [name] smaller wound over  the left Achilles area. Both of the  areas on the left lateral leg are covered with an adherent necrotic surface slough. This debridement is with great difficulty. [name] 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 In spite of that his leg actually looks somewhat better. There is still copious drainage and some erythema but it is generally better. The oral options that were obvious including Zyvox and sulfonamides he has rash issues both of these. This is sensitive to rifampin but this is not usually used along gentamicin but this is parenteral and again not used along. The obvious alternative is vancomycin. He has had his arterial studies. [name] T brachial index was 1.3 oe on the right. His waveforms were biphasic bilaterally. Doppler waveforms of the digit were normal in the right damp and on the left. Comment that this could've been due to extreme edema. His venous studies show reflux on both sides in the femoral popliteal veins as well as the greater and lesser saphenous veins bilaterally. Ultimately he is going to need to see vascular surgery about this issue. Hopefully when we can get his wounds and [name] little better shape. 02/19/16; the patient was able to complete [name] course of [facility]'s for MRSA in the face of multiple antibiotic allergies. Arterial studies showed an ABI of him 0.88 on the right 1.17 on the left the. Waveforms were biphasic at the posterior tibial and dorsalis pedis digital waveforms were normal. Right toe brachial index was 1.3 limited by shaking and edema. His venous study showed widespread reflux in the left at the common femoral vein the greater and lesser saphenous vein the greater and lesser saphenous vein on the right as well as the popliteal and femoral vein.  The popliteal and femoral vein on the left did not show reflux. His wounds on the right leg give healed on the left he is still using Santyl. 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 The erythema has not really resolved and I wonder if this is mostly venous inflammation rather than cellulitis. Still using Santyl. He is approved for Apligraf 03/04/16; there is less erythema around the wound. Both wounds require aggressive surgical debridement. Not yet ready for Apligraf 03/11/16; aggressive debridement again. Not ready for Apligraf 03/18/16 aggressive debridement again. 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 Has been to see vascular surgery he is being planned for [name] venous ablation 03/25/16; aggressive debridement again of both wound areas on the left lateral leg. He is due for ablation surgery on May 22. He is much closer to being ready for an Apligraf. Has [name] new area between the left first and second toes 04/01/16 aggressive debridement done of both wounds. The new wound at the base of between his second and first toes looks stable 04/08/16; continued aggressive debridement of both wounds on the left lower leg. He goes for his venous ablation on Monday. The new wound at the base of his first and second toes dorsally appears stable. 04/15/16; wounds aggressively debridement although the base of this looks considerably better Apligraf #1. He had ablation surgery on Monday I'll need to research these records. We only have approval for four Apligraf's 04/22/16; the  patient is here for [name] wound check [Apligraf last week] intake nurse concerned about erythema around the wounds. Apparently [name] significant degree of drainage. The patient has chronic venous inflammation which I think accounts for most of this however I was asked to look at this today 04/26/16; the patient came back for check of possible cellulitis in his left foot however the Apligraf  dressing was inadvertently removed therefore we elected to prep the wound for [name] second Apligraf. I put him on doxycycline on 6/1 the erythema in the foot 05/03/16 we did not remove the dressing from the superior wound as this is where I put all of his last Apligraf. Surface debridement done with [name] curette of the lower wound which looks very healthy. The area on the left foot also looks quite satisfactory at the dorsal artery at the first and second toes 05/10/16; continue Apligraf to this. Her wound, Hydrafera to the lower wound. He has [name] new area on the right second toe. Left dorsal foot firstoosecond toe also looks improved 05/24/16; wound dimensions must be smaller I was able to use Apligraf to all 3 remaining wound areas. 06/07/16 patient's last Apligraf was 2 weeks ago. He arrives today with the 2 wounds on his lateral left leg joined together. This would have to be seen as [name] negative. He also has [name] small wound in his first and second toe on the left dorsally with quite [name] bit of surrounding erythema in the first second and third toes. This looks to be infected or inflamed, very difficult clinical call. 06/21/16: lateral left leg combined wounds. Adherent surface slough area on the left dorsal foot at roughly the fourth toe looks improved 07/12/16; he now has [name] single linear wound on the lateral left leg. This does not look to be [name] lot changed from when I lost saw this. The area on his dorsal left foot looks considerably better however. 08/02/16; no major change in the substantial area on his left lateral leg since last time. We have been using Hydrofera Blue for [name] prolonged period of time now. The area on his left foot is also unchanged from last review 07/19/16; the area on his dorsal foot on the left looks considerably smaller. He is beginning to have significant rims of epithelialization on the lateral left leg wound. This also looks better. 08/05/16; the patient came in for [name] nurse visit today.  Apparently the area on his left lateral leg looks better and it was wrapped. However in general discussion the patient noted [name] new area on the dorsal aspect of his right second toe. The exact etiology of this is unclear but likely relates to pressure. 08/09/16 really the area on the left lateral leg did not really look that healthy today perhaps slightly larger and measurements. The area on his dorsal right second toe is improved also the left foot wound looks stable to improved 08/16/16; the area on the last lateral leg did not change any of dimensions. Post debridement with [name] curet the area looked better. Left foot wound improved and the area on the dorsal right second toe is improved 08/23/16; the area on the left lateral leg may be slightly smaller both in terms of length and width. Aggressive debridement with [name] curette afterwards the tissue appears healthier. Left foot wound appears improved in the area on the dorsal right second toe is improved 08/30/16 patient developed [name] fever over the weekend and was seen in an urgent care. Felt to have [name]  UTI and put on doxycycline. He has been since changed over the phone to [facility]. After we took off the wrap on his right leg today the leg is swollen warm and erythematous, probably more likely the source of the fever 09/06/16; have been using collagen to the major left leg wound, silver alginate to the area on his anterior foot/toes 09/13/16; the areas on his anterior foot/toes on both sides appear to be virtually closed. Extensive wound on the left lateral leg perhaps slightly narrower but each visit still covered an adherent surface slough 09/16/16 patient was in for his usual Thursday nurse visit however the intake nurse noted significant erythema of his dorsal right foot. He is also running [name] low- grade fever and having increasing spasms in the right leg 09/20/16 here for cellulitis involving his right great toes and forefoot. This is [name] lot better. Still  requiring debridement on his left lateral leg. Santyl direct says he needs prior authorization. Therefore his wife cannot change this at home 09/30/16; the patient's extensive area on the left lateral calf and ankle perhaps somewhat better. Using Santyl. The area on the left toes is healed and I think the area on his right dorsal foot is healed as well. There is no cellulitis or venous inflammation involving the right leg. He is going to need compression stockings here. 10/07/16; the patient's extensive wound on the left lateral calf and ankle does not measure any differently however there appears to be less adherent surface slough using Santyl and aggressive weekly debridements 10/21/16; no major change in the area on the left lateral calf. Still the same measurement still very difficult to debridement adherent slough and nonviable subcutaneous tissue. This is not really been helped by several weeks of Santyl. Previously for 2 weeks I used Iodoflex for [name] short period. [name] prolonged course of Hydrofera Blue didn't really help. I'm not sure why I only used 2 weeks of Iodoflex on this there is no evidence of surrounding infection. He has [name] small area on the right second toe which looks as though it's progressing towards closure 10/28/16; the wounds on his toes appear to be closed. No major change in the left lateral leg wound although the surface looks somewhat better using Iodoflex. He has had previous arterial studies that were normal. He has had reflux studies and is status post ablation although I don't have any exact notes on which vein was ablated. I'll need to check the surgical record 11/04/16; he's had [name] reopening between the first and second toe on the left and right. No major change in the left lateral leg wound. There is what appears to be cellulitis of the left dorsal foot 11/18/16 the patient was hospitalized initially in [place] and then subsequently transferred to [facility] long and was  admitted there from 11/09/16 through 11/12/16. He had developed progressive cellulitis on the right leg in spite of the doxycycline I gave him. I'd spoken to the hospitalist in [place] who was concerned about continuing leukocytosis. CT scan is what I suggested this was done which showed soft tissue swelling without evidence of osteomyelitis or an underlying abscess blood cultures were negative. At [facility] he was treated with vancomycin and Primaxin and then add an infectious disease consult. He was transitioned to Ceftaroline. He has been making progressive improvement. Overall [name] severe cellulitis of the right leg. He is been using silver alginate to her original wound on the left leg. The wounds in his toes on the right are  closed there is [name] small open area on the base of the left second toe 11/26/15; the patient's right leg is much better although there is still some edema here this could be reminiscent from his severe cellulitis likely on top of some degree of lymphedema. His left anterior leg wound has less surface slough as reported by her intake nurse. Small wound at the base of the left second toe 12/02/16; patient's right leg is better and there is no open wound here. His left anterior lateral leg wound continues to have [name] healthy-looking surface. Small wound at the base of the left second toe however there is erythema in the left forefoot which is worrisome 12/16/16; is no open wounds on his right leg. We took measurements for stockings. His left anterior lateral leg wound continues to have [name] healthy-looking surface. I'm not sure where we were with the Apligraf run through his insurance. We have been using Iodoflex. He has [name] thick eschar on the left first second toe interface, I suspect this may be fungal however there is no visible open 12/23/16; no open wound on his right leg. He has 2 small areas left of the linear wound that was remaining last week. We have been using Prisma, I thought  I have disclosed this week, we can only look forward to next week 01/03/17; the patient had concerning areas of erythema last week, already on doxycycline for UTI through his primary doctor. The erythema is absolutely no better there is warmth and swelling both medially from the left lateral leg wound and also the dorsal left foot. 01/06/17- Patient is here for follow-up evaluation of his left lateral leg ulcer and bilateral feet ulcers. He is on oral antibiotic therapy, tolerating that. Nursing staff and the patient states that the erythema is improved from Monday. 01/13/17; the predominant left lateral leg wound continues to be problematic. I had put Apligraf on him earlier this month once. However he subsequently developed what appeared to be an intense cellulitis around the left lateral leg wound. I gave him Dalvance I think on 2/12 perhaps 2/13 he continues on cefdinir. The erythema is still present but the warmth and swelling is improved. I am hopeful that the cellulitis part of this control. I wouldn't be surprised if there is an element of venous inflammation as well. 01/17/17. The erythema is present but better in the left leg. His left lateral leg wound still does not have [name] viable surface buttons certain parts of this long thin wound it appears like there has been improvement in dimensions. 01/20/17; the erythema still present but much better in the left leg. I'm thinking this is his usual degree of chronic venous inflammation. The wound on the left leg looks somewhat better. Is less surface slough 01/27/17; erythema is back to the chronic venous inflammation. The wound on the left leg is somewhat better. I am back to the point where I like to try an Apligraf once again 02/10/17; slight improvement in wound dimensions. Apligraf #2. He is completing his doxycycline 02/14/17; patient arrives today having completed doxycycline last Thursday. This was supposed to be [name] nurse visit however once again he  hasn't tense erythema from the medial part of his wound extending over the lower leg. Also erythema in his foot this is roughly in the same distribution as last time. He has baseline chronic venous inflammation however this is [name] lot worse than the baseline I have learned to accept the on him is baseline inflammation 02/24/17-  patient is here for follow-up evaluation. He is tolerating compression therapy. His voicing no complaints or concerns he is here anticipating an Apligraf 03/03/17; he arrives today with an adherent necrotic surface. I don't think this is surface is going to be amenable for Apligraf's. The erythema around his wound and on the left dorsal foot has resolved he is off antibiotics 03/10/17; better-looking surface today. I don't think he can tolerate Apligraf's. He tells me he had [name] wound VAC after [name] skin graft years ago to this area and they had difficulty with [name] seal. The erythema continues to be stable around this some degree of chronic venous inflammation but he also has recurrent cellulitis. We have been using Iodoflex 03/17/17; continued improvement in the surface and may be small changes in dimensions. Using Iodoflex which seems the only thing that will control his surface 03/24/17- He is here for follow up evaluation of his LLE lateral ulceration and ulcer to right dorsal foot/toe space. He is voicing no complaints or concerns, He is tolerating compression wrap. 03/31/17 arrives today with [name] much healthier looking wound on the left lower extremity. We have been using Iodoflex for [name] prolonged period of time which has for the first time prepared and adequate looking wound bed although we have not had much in the way of wound dimension improvement. He also has [name] small wound between the first and second toe on the right 04/07/17; arrives today with [name] healthy-looking wound bed and at least the top 50% of this wound appears to be now her. No debridement was required I have changed him to  [facility] last week after prolonged Iodoflex. He did not do well with Apligraf's. We've had [name] re-opening between the first and second toe on the right 04/14/17; arrives today with [name] healthier looking wound bed contractions and the top 50% of this wound and some on the lesser 50%. Wound bed appears healthy. The area between the first and second toe on the right still remains problematic 04/21/17; continued very gradual improvement. Using [facility] 04/28/17; continued very gradual improvement in the left lateral leg venous insufficiency wound. His periwound erythema is very mild. We have been using Hydrofera Blue. Wound is making progress especially in the superior 50% 05/05/17; he continues to have very gradual improvement in the left lateral venous insufficiency wound. Both in terms with an length rings are improving. I debrided this every 2 weeks with #5 curet and we have been using Hydrofera Blue and again making good progress With regards to the wounds between his right first and second toe which I thought might of been tinea pedis he is not making as much progress very dry scaly skin over the area. Also the area at the base of the left first and second toe in [name] similar condition 05/12/17; continued gradual improvement in the refractory left lateral venous insufficiency wound on the left. Dimension smaller. Surface still requiring debridement using Hydrofera Blue 05/19/17; continued gradual improvement in the refractory left lateral venous ulceration. Careful inspection of the wound bed underlying rumination suggested some degree of epithelialization over the surface no debridement indicated. Continue Hydrofera Blue difficult areas between his toes first and third on the left than first and second on the right. I'm going to change to silver alginate from silver collagen. Continue ketoconazole as I suspect underlying tinea pedis 05/26/17; left lateral leg venous insufficiency wound. We've been  using Hydrofera Blue. I believe that there is expanding epithelialization over the surface of the  wound albeit not coming from the wound circumference. This is [name] bit of an odd situation in which the epithelialization seems to be coming from the surface of the wound rather than in the exact circumference. There is still small open areas mostly along the lateral margin of the wound. ooHe has unchanged areas between the left first and second and the right first second toes which I been treating for tenia pedis 06/02/17; left lateral leg venous insufficiency wound. We have been using Hydrofera Blue. Somewhat smaller from the wound circumference. The surface of the wound remains [name] bit on it almost epithelialized sedation in appearance. I use an open curette today debridement in the surface of all of this especially the edges ooSmall open wounds remaining on the dorsal right first and second toe interspace and the plantar left first second toe and her face on the left 06/09/17; wound on the left lateral leg continues to be smaller but very gradual and very dry surface using Hydrofera Blue 06/16/17 requires weekly debridements now on the left lateral leg although this continues to contract. I changed to silver collagen last week because of dryness of the wound bed. Using Iodoflex to the areas on his first and second toes/web space bilaterally 06/24/17; patient with history of paraplegia also chronic venous insufficiency with lymphedema. Has [name] very difficult wound on the left lateral leg. This has been gradually reducing in terms of with but comes in with [name] very dry adherent surface. High switch to silver collagen [name] week or so ago with hydrogel to keep the area moist. This is been refractory to multiple dressing attempts. He also has areas in his first and second toes bilaterally in the anterior and posterior web space. I had been using Iodoflex here after [name] prolonged course of silver alginate with ketoconazole  was ineffective [question tinea pedis] 07/14/17; patient arrives today with [name] very difficult adherent material over his left lateral lower leg wound. He also has surrounding erythema and poorly controlled edema. He was switched his Santyl last visit which the nurses are applying once during his doctor visit and once on [name] nurse visit. He was also reduced to 2 layer compression I'm not exactly sure of the issue here. 07/21/17; better surface today after 1 week of Iodoflex. Significant cellulitis that we treated last week also better. [Doxycycline] 07/28/17 better surface today with now 2 weeks of Iodoflex. Significant cellulitis treated with doxycycline. He has now completed the doxycycline and he is back to his usual degree of chronic venous inflammation/stasis dermatitis. He reminds me he has had ablations surgery here 08/04/17; continued improvement with Iodoflex to the left lateral leg wound in terms of the surface of the wound although the dimensions are better. 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 Problematic areas on the plantar aspect of the first second toe web space on the left and the dorsal aspect of the first second toe web space on the right. At one point I felt these were probably related to chronic fungal infections in treated him aggressively for this although we have not made any improvement here. 08/11/17; left lateral leg. Surface continues to improve with the Iodoflex although we are not seeing much improvement in overall wound dimensions. Areas on his plantar left foot and right foot show no improvement. In fact the right foot looks somewhat worse 08/18/17; left lateral leg. We changed to [facility] Blue last week after [name] prolonged course of  Iodoflex which helps get the surface better. It appears that the wound with is improved. Continue with difficult areas on the left dorsal first second and plantar first second  on the right 09/01/17; patient arrives in clinic today having had [name] temperature of 103 yesterday. He was seen in the ER and [facility]. The patient was concerned he could have cellulitis again in the right leg however they diagnosed him with [name] UTI and he is now on Keflex. He has [name] history of cellulitis which is been recurrent and difficult but this is been in the left leg, in the past 5 use doxycycline. He does in and out catheterizations at home which are risk factors for UTI 09/08/17; patient will be completing his Keflex this weekend. The erythema on the left leg is considerably better. He has [name] new wound today on the medial part of the right leg small superficial almost looks like [name] skin tear. He has worsening of the area on the right dorsal first and second toe. His major area on the left lateral leg is better. Using Hydrofera Blue on all areas 09/15/17; gradual reduction in width on the long wound in the left lateral leg. No debridement required. He also has wounds on the plantar aspect of his left first second toe web space and on the dorsal aspect of the right first second toe web space. 09/22/17; there continues to be very gradual improvements in the dimensions of the left lateral leg wound. He hasn't round erythematous spot with might be pressure on his wheelchair. There is no evidence obviously of infection no purulence no warmth ooHe has [name] dry scaled area on the plantar aspect of the left first second toe ooImproved area on the dorsal right first second toe. 09/29/17; left lateral leg wound continues to improve in dimensions mostly with an is still [name] fairly long but increasingly narrow wound. ooHe has [name] dry scaled area on the plantar aspect of his left first second toe web space ooIncreasingly concerning area on the dorsal right first second toe. In fact I am concerned today about possible cellulitis around this wound. The areas extending up his second toe and although there is  deformities here almost appears to abut on the nailbed. 10/06/17; left lateral leg wound continues to make very gradual progress. Tissue culture I did from the right first second toe dorsal foot last time grew MRSA and enterococcus which was vancomycin sensitive. This was not sensitive to clindamycin or doxycycline. He is allergic to Zyvox and sulfa we have therefore arrange for him to have dalvance infusion tomorrow. He is had this in the past and tolerated it well 10/20/17; left lateral leg wound continues to make decent progress. This is certainly reduced in terms of with there is advancing epithelialization.ooThe cellulitis in the right foot looks better although he still has [name] deep wound in the dorsal aspect of the first second toe web space. Plantar left first toe web space on the left I think is making some progress 10/27/17; left lateral leg wound continues to make decent progress. Advancing epithelialization.using Hydrofera Blue ooThe right first second toe web space wound is better-looking using silver alginate ooImprovement in the left plantar first second toe web space. Again using silver alginate 11/03/17 left lateral leg wound continues to make decent progress albeit slowly. Using Hydrofera Blue ooThe right per second toe web space continues to be [name] very problematic looking punched out wound. I obtained [name] piece of tissue for deep culture I did  extensively treated this for fungus. It is difficult to imagine that this is [name] pressure area as the patient states other than going outside he doesn't really wear shoes at home ooThe left plantar first second toe web space looked fairly senescent. Necrotic edges. This required debridement oochange to Hydrofera Blue to all wound areas 11/10/17; left lateral leg wound continues to contract. Using Hydrofera Blue ooOn the right dorsal first second toe web space dorsally. Culture I did of this area last week grew MRSA there is not an easy oral  option in this patient was multiple antibiotic allergies or intolerances. This was only [name] rare culture isolate I'm therefore going to use Bactroban under silver alginate ooOn the left plantar first second toe web space. Debridement is required here. This is also unchanged 11/17/17; left lateral leg wound continues to contract using Hydrofera Blue this is no longer the major issue. ooThe major concern here is the right first second toe web space. He now has an open area going from dorsally to the plantar aspect. There is now wound on the inner lateral part of the first toe. Not [name] very viable surface on this. There is erythema spreading medially into the forefoot. ooNo major change in the left first second toe plantar wound 11/24/17; left lateral leg wound continues to contract using Hydrofera Blue. Nice improvement today ooThe right first second toe web space all of this looks [name] lot less angry than last week. I have given him clindamycin and topical Bactroban for MRSA and terbinafine for the possibility of underlining tinea pedis that I could not control with ketoconazole. Looks somewhat better ooThe area on the plantar left first second toe web space is weeping with dried debris around the wound 12/01/17; left lateral leg wound continues to contract he Hydrofera Blue. It is becoming thinner in terms of with nevertheless it is making good improvement. ooThe right first second toe web space looks less angry but still [name] large necrotic-looking wounds starting on the plantar aspect of the right foot extending between the toes and now extensively on the base of the right second toe. I gave him clindamycin and topical Bactroban for MRSA anterior benefiting for the possibility of underlying tinea pedis. Not looking better today ooThe area on the left first/second toe looks better. Debrided of necrotic debris 12/05/17* the patient was worked in urgently today because over the weekend he found blood on his  incontinence bad when he woke up. He was found to have an ulcer by his wife who does most of his wound care. He came in today for [place] to look at this. He has not had [name] history of wounds in his buttocks in spite of his paraplegia. 12/08/17; seen in follow-up today at his usual appointment. He was seen earlier this week and found to have [name] new wound on his buttock. We also follow him for wounds on the left lateral leg, left first second toe web space and right first second toe web space 12/15/17; we have been using Hydrofera Blue to the left lateral leg which has improved. The right first second toe web space has also improved. Left first second toe web space plantar aspect looks stable. The left buttock has worsened using Santyl. Apparently the buttock has drainage 12/22/17; we have been using Hydrofera Blue to the left lateral leg which continues to improve now 2 small wounds separated by normal skin. He tells [place] he had [name] fever up to 100 yesterday he is prone to  UTIs but has not noted anything different. He does in and out catheterizations. The area between the first and second toes today does not look good necrotic surface covered with what looks to be purulent drainage and erythema extending into the third toe. I had gotten this to something that I thought look better last time however it is not look good today. He also has [name] necrotic surface over the buttock wound which is expanded. I thought there might be infection under here so I removed [name] lot of the surface with [name] #5 curet though nothing look like it really needed culturing. He is been using Santyl to this area 12/27/17; his original wound on the left lateral leg continues to improve using Hydrofera Blue. I gave him samples of Baxdella although he was unable to take them out of fear for an allergic reaction ["lump in his throat"].the culture I did of the purulent drainage from his second toe last week showed both enterococcus and [name] set Enterobacter I  was also concerned about the erythema on the bottom of his foot although paradoxically although this looks somewhat better today. 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 We've been using silver alginate here. They came up today that he sleeps in [name] recliner, I'm not sure why but I asked him to stop this 01/03/18; his original wound we've been using Hydrofera Blue is now separated into 2 areas. ooUlcer on his left buttock is better he is off the recliner and sleeping in bed ooFinally both wound areas between his first and second toes also looks some better 01/10/18; his original wound on the left lateral leg is now separated into 2 wounds we've been using Hydrofera Blue ooUlcer on his left buttock has some drainage. There is [name] small probing site going into muscle layer superiorly.using silver alginate -He arrives today with [name] deep tissue injury on the left heel ooThe wound on the dorsal aspect of his first second toe on the left looks [name] lot betterusing silver alginate ketoconazole ooThe area on the first second toe web space on the right also looks [name] lot bette 01/17/18; his original wound on the left lateral leg continues to progress using Hydrofera Blue ooUlcer on his left buttock also is smaller surface healthier except for [name] small probing site going into the muscle layer superiorly. 2.4 cm of tunneling in this area ooDTI on his left heel we have only been offloading. Looks better than last week no threatened open no evidence of infection oothe wound on the dorsal aspect of the first second toe on the left continues to look like it's regressing we have only been using silver alginate and terbinafine orally ooThe area in the first second toe web space on the right also looks to be [name] lot better using silver alginate and terbinafine I think this was prompted by tinea pedis 01/31/18; the patient was  hospitalized in [place] last week apparently for [name] complicated UTI. He was discharged on cefepime he does in and out catheterizations. In the hospital he was discovered M I don't mild elevation of AST and ALT and the terbinafine was stopped.predictably the pressure ulcer on s his buttock looks betterusing silver alginate. The area on the left lateral leg also is better using Hydrofera Blue. The area between the first and second toes on the left better. First and second toes on the right still substantial but better. Finally  the DTI on the left heel has held together and looks like it's resolving 02/07/18-he is here in follow-up evaluation for multiple ulcerations. He has new injury to the lateral aspect of the last issue [name] pressure ulcer, he states this is from adhesive removal trauma. He states he has tried multiple adhesive products with no success. All other ulcers appear stable. The left heel DTI is resolving. We will continue with same treatment plan and follow-up next week. 02/14/18; follow-up for multiple areas. ooHe has [name] new area last week on the lateral aspect of his pressure ulcer more over the posterior trochanter. The original pressure ulcer looks quite stable has healthy granulation. 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 Almost fully epithelialized on the original superior area using Hydrofera Blue ooDTI on the left heel has peeled off this week to reveal [name] small superficial wound under denuded skin and subcutaneous tissue ooBoth areas between the first and second toes look better including nothing open on the left 02/21/18; ooThe patient's wounds on his left ischial tuberosity and posterior left greater trochanter actually looked better. He has [name] large area of irritation around the area which I think is contact dermatitis. I am doubtful that this is fungal ooHis original  wound on the left lateral calf continues to improve we have been using Hydrofera Blue ooThere is no open area in the left first second toe web space although there is [name] lot of thick callus ooThe DTI on the left heel required debridement today of necrotic surface eschar and subcutaneous tissue using silver alginate ooFinally the area on the right first second toe webspace continues to contract using silver alginate and ketoconazole 02/28/18 ooLeft ischial tuberosity wounds look better using silver alginate. ooOriginal wound on the left calf only has one small open area left using Hydrofera Blue ooDTI on the left heel required debridement mostly removing skin from around this wound surface. Using silver alginate ooThe areas on the right first/second toe web space using silver alginate and ketoconazole 03/08/18 on evaluation today patient appears to be doing decently well as best I can tell in regard to his wounds. This is the first time that I have seen him as he generally is followed by Dr. Dellia Nims. With that being said none of his wounds appear to be infected he does have an area where there is some skin covering what appears to be [name] new wound on the left dorsal surface of his great toe. This is right at the nail bed. With that being said I do believe that debrided away some of the excess skin can be of benefit in this regard. Otherwise he has been tolerating the dressing changes without complication. 03/14/18; patient arrives today with the multiplicity of wounds that we are following. He has not been systemically unwell ooOriginal wound on the left lateral calf now only has 2 small open areas we've been using Hydrofera Blue which should continue ooThe deep tissue injury on the left heel requires debridement today. We've been using silver alginate ooThe left first second toe and the right first second toe are both are reminiscence what I think was tinea pedis. Apparently some of the callus  Surface between the toes was removed last week when it started draining. ooPurulent drainage coming from the wound on the ischial tuberosity on the left. 03/21/18-He is here in follow-up evaluation for multiple wounds. 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 He tolerated debridement. The only change to last week's recommendations is to discontinue antifungal cream between toes. He will follow-up next week 03/28/18; following up for multiple wounds;Concern this week is streaking redness and swelling in the right foot. He is going to need antibiotics for this. 03/31/18; follow-up for right foot cellulitis. Streaking redness and swelling in the right foot on 03/28/18. 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 I put him on clindamycin 300 mg every 6 and brought him in for [name] quick check. He has an open wound between his first and second toes on the right foot as [name] potential source. 04/04/18; ooRight foot cellulitis is resolving he is completing clindamycin. This is truly good news ooLeft lateral calf wound which is initial wound only has one small open area inferiorly this is close to healing out. He has compression stockings. We will use Hydrofera Blue right down to the epithelialization of this ooNonviable surface on the left heel which was initially pressure with [name] DTI. We've been using Hydrofera Blue. I'm going to switch this back to silver alginate ooLeft first second toe/tinea pedis this looks better using silver alginate ooRight first second toe tinea pedis using silver alginate ooLarge pressure ulcers on theLeft ischial tuberosity. Small wound here Looks better. I am uncertain about the surface over the large wound. Using silver alginate 04/11/18; ooCellulitis in the right foot is resolved ooLeft lateral calf wound which was his original wounds still has 2 tiny open areas remaining this is  just about closed ooNonviable surface on the left heel is better but still requires debridement ooLeft first second toe/tinea pedis still open using silver alginate ooRight first second toe wound tinea pedis I asked him to go back to using ketoconazole and silver alginate ooLarge pressure ulcers on the left ischial tuberosity this shear injury here is resolved. Wound is smaller. No evidence of infection using silver alginate 04/18/18; ooPatient arrives with an intense area of cellulitis in the right mid lower calf extending into the right heel area. Bright red and warm. Smaller area on the left anterior leg. He has [name] significant history of MRSA. He will definitely need antibioticsoodoxycycline ooHe now has 2 open areas on the left ischial tuberosity the original large wound and now [name] satellite area which I think was above his initial satellite areas. Not [name] wonderful surface on this satellite area surrounding erythema which looks like pressure related. ooHis left lateral calf wound again his original wound is just about closed ooLeft heel pressure injury still requiring debridement ooLeft first second toe looks [name] lot better using silver alginate ooRight first second toe also using silver alginate and ketoconazole cream also looks better 04/20/18; the patient was worked in early today out of concerns with his cellulitis on the right leg. I had started him on doxycycline. This was 2 days ago. His wife was concerned about the swelling in the area. Also concerned about the left buttock. He has not been systemically unwell no fever chills. No nausea vomiting or diarrhea 04/25/18; the patient's left buttock wound is continued to deteriorate he is using Hydrofera Blue. He is still completing clindamycin for the cellulitis on the right leg although all of this looks better. 05/02/18 ooLeft buttock wound still with [name] lot of drainage and [name] very tightly adherent fibrinous necrotic surface. He has [name]  deeper area superiorly ooThe left lateral calf wound is still closed ooDTI wound on  the left heel necrotic surface especially the circumference using Iodoflex ooAreas between his left first second toe and right first second toe both look better. Dorsally and the right first second toe he had [name] necrotic surface although at smaller. In using silver alginate and ketoconazole. I did [name] culture last week which was [name] deep tissue culture of the reminiscence of the open wound on the right first second toe dorsally. This grew [name] few Acinetobacter and [name] few methicillin-resistant staph aureus. Nevertheless the area actually this week looked better. I didn't feel the need to specifically address this at least in terms of systemic antibiotics. 05/09/18; wounds are measuring larger more drainage per our intake. We are using Santyl covered with alginate on the large superficial buttock wounds, Iodosorb on the left heel, ketoconazole and silver alginate to the dorsal first and second toes bilaterally. 05/16/18; ooThe area on his left buttock better in some aspects although the area superiorly over the ischial tuberosity required an extensive debridement.using Santyl ooLeft heel appears stable. Using Iodoflex ooThe areas between his first and second toes are not bad however there is spreading erythema up the dorsal aspect of his left foot this looks like cellulitis again. He is insensate the erythema is really very brilliant.o Erysipelas He went to see an allergist days ago because he was itching part of this he had lab work done. This showed [name] white count of 15.1 with 70% neutrophils. Hemoglobin of 11.4 and [name] platelet count of 659,000. Last white count we had in Epic was [name] 2-1/2 years ago which was 25.9 but he was ill at the time. He was able to show me some lab work that was done by his primary physician the pattern is about the same. 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 But prompted me to go ahead and do x-rays of both feet and the pelvis rule out osteomyelitis. He also had [name] comprehensive metabolic panel this was reasonably normal his albumin was 3.7 liver function tests BUN/creatinine all normal 05/23/18; x-rays of both his feet from last week were negative for underlying pulmonary abnormality. The x-ray of his pelvis however showed mild irregularity in the left ischial which may represent some early osteomyelitis. The wound in the left ischial continues to get deeper clearly now exposed muscle. Each week necrotic surface material over this area. Whereas the rest of the wounds do not look so bad. ooThe left ischial wound we have been using Santyl and calcium alginate ooT the left heel surface necrotic debris using Iodoflex o ooThe left lateral leg is still healed ooAreas on the left dorsal foot and the right dorsal foot are about the same. There is some inflammation on the left which might represent contact dermatitis, fungal dermatitis I am doubtful cellulitis although this looks better than last week 05/30/18; CT scan done at Hospital did not show any osteomyelitis or abscess. Suggested the possibility of underlying cellulitis although I don't see [name] lot of evidence of this at the bedside ooThe wound itself on the left buttock/upper thigh actually looks somewhat better. No debridement ooLeft heel also looks better no debridement continue Iodoflex ooBoth dorsal first second toe spaces appear better using Lotrisone. Left still required debridement 06/06/18; ooIntake reported some purulent looking drainage from the left gluteal wound. Using Santyl and calcium alginate ooLeft heel looks better although still [name] nonviable surface requiring debridement ooThe left dorsal foot first/second webspace actually expanding and somewhat deeper. I  may consider doing [name] shave biopsy of this area ooRight dorsal foot first/second webspace appears stable to  improved. Using Lotrisone and silver alginate to both these areas 06/13/18 ooLeft gluteal surface looks better. Now separated in the 2 wounds. No debridement required. Still drainage. We'll continue silver alginate ooLeft heel continues to look better with Iodoflex continue this for at least another week ooOf his dorsal foot wounds the area on the left still has some depth although it looks better than last week. We've been using Lotrisone and silver alginate 06/20/18 ooLeft gluteal continues to look better healthy tissue ooLeft heel continues to look better healthy granulation wound is smaller. He is using Iodoflex and his long as this continues continue the Iodoflex ooDorsal right foot looks better unfortunately dorsal left foot does not. There is swelling and erythema of his forefoot. He had minor trauma to this several days ago but doesn't think this was enough to have caused any tissue injury. Foot looks like cellulitis, we have had this problem before 06/27/18 on evaluation today patient appears to be doing [name] little worse in regard to his foot ulcer. 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 Both of which would really be his only options for treating this infection. In the past he has been given and effusion of Orbactiv. This is done very well for him in the past again it's one time dosing IV antibiotic therapy. Subsequently I do believe this is something we're gonna need to see about doing at this point in time. Currently his other wounds seem to be doing somewhat better in my pinion I'm pretty happy in that regard. 07/03/18 on evaluation today patient's wounds actually appear to be doing fairly well. He has been tolerating the dressing changes without complication. All in all he seems to be showing signs of improvement. In regard to the antibiotics he  has been dealing with infectious disease since I saw him last week as far as getting this scheduled. In the end [facility]'s [facility] this coming Friday. In the meantime he has been continuing to perform the dressing changes in such as previous. There does not appear to be any evidence of infection worsengin at this time. 07/10/18; ooSince I last saw this man 2 weeks ago things have actually improved. IV antibiotics of resulted in less forefoot erythema although there is still some present. He is not systemically unwell ooLeft buttock wounds o2 now have no depth there is increased epithelialization Using silver alginate ooLeft heel still requires debridement using Iodoflex ooLeft dorsal foot still with [name] sizable wound about the size of [name] border but healthy granulation ooRight dorsal foot still with [name] slitlike area using silver alginate 07/18/18; the patient's cellulitis in the left foot is improved in fact I think it is on its way to resolving. ooLeft buttock wounds o2 both look better although the larger one has hypertension granulation we've been using silver alginate ooLeft heel has some thick circumferential redundant skin over the wound edge which will need to be removed today we've been using Iodoflex ooLeft dorsal foot is still [name] sizable wound required debridement using silver alginate ooThe right dorsal foot is just about closed only [name] small open area remains here 07/25/18; left foot cellulitis is resolved ooLeft buttock wounds o2 both look better.  Hyper-granulation on the major area ooLeft heel as some debris over the surface but otherwise looks [name] healthier wound. Using silver collagen ooRight dorsal foot is just about closed 07/31/18; arrives with our intake nurse worried about purulent drainage from the buttock. We had hyper-granulation here last week ooHis buttock wounds o2 continue to look better ooLeft heel some  debris over the surface but measuring smaller. ooRight dorsal foot unfortunately has openings between the toes ooLeft foot superficial wound looks less aggravated. 08/07/18 ooButtock wounds continue to look better although some of her granulation and the larger medial wound. silver alginate ooLeft heel continues to look [name] lot better.silver collagen ooLeft foot superficial wound looks less stable. Requires debridement. He has [name] new wound superficial area on the foot on the lateral dorsal foot. ooRight foot looks better using silver alginate without Lotrisone 08/14/2018; patient was in the ER last week diagnosed with [name] UTI. He is now on Cefpodoxime and Macrodantin. ooButtock wounds continued to be smaller. Using silver alginate ooLeft heel continues to look better using silver collagen ooLeft foot superficial wound looks as though it is improving ooRight dorsal foot area is just about healed. 08/21/2018; patient is completed his antibiotics for his UTI. ooHe has 2 open areas on the buttocks. There is still not closed although the surface looks satisfactory. Using silver alginate ooLeft heel continues to improve using silver collagen ooThe bilateral dorsal foot areas which are at the base of his first and second toes/possible tinea pedis are actually stable on the left but worse on the right. The area on the left required debridement of necrotic surface. After debridement I obtained [name] specimen for PCR culture. ooThe right dorsal foot which is been just about healed last week is now reopened 08/28/2018; culture done on the left dorsal foot showed coag negative staph both staph epidermidis and Lugdunensis. I think this is worthwhile initiating systemic treatment. I will use doxycycline given his long list of allergies. The area on the left heel slightly improved but still requiring debridement. ooThe large wound on the buttock is just about closed whereas the smaller one is larger. Using  silver alginate in this area 09/04/2018; patient is completing his doxycycline for the left foot although this continues to be [name] very difficult wound area with very adherent necrotic debris. We are using silver alginate to all his wounds right foot left foot and the small wounds on his buttock, silver collagen on the left heel. 09/11/2018; once again this patient has intense erythema and swelling of the left forefoot. Lesser degrees of erythema in the right foot. He has [name] long list of allergies and intolerances. I will reinstitute doxycycline. 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 Using silver alginate ooLeft heel also looks better using silver collagen ooUnfortunately both the areas on his feet look worse. The area on the left first second webspace is now gone through to the plantar part of his foot. The area on the left foot anteriorly is irritated with erythema and swelling in the forefoot. 09/25/2018 ooHis wound on the left plantar heel looks better. Using silver collagen ooThe area on the left buttock 2 small remnant areas. One is closed one is still open. Using silver alginate ooThe areas between both his first and second toes look worse. This in spite of long-standing antifungal therapy with ketoconazole and silver alginate which should have antifungal activity ooHe has small areas around his original wound on the  left calf one is on the bottom of the original scar tissue and one superiorly both of these are small and superficial but again given wound history in this site this is worrisome 10/02/2018 ooLeft plantar heel continues to gradually contract using silver collagen ooLeft buttock wound is unchanged using silver alginate ooThe areas on his dorsal feet between his first and second toes bilaterally look about the same. 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 Small open area here. We have been using silver alginate 10/09/2018; ooLeft plantar heel continues to look healthy and contract. No debridement is required ooLeft buttock slightly smaller with [name] tape injury wound just below which was new this week ooDorsal feet somewhat improved I have been using clindamycin ooLeft lateral looks lower extremity the actual open area looks worse although [name] lot of this is epithelialized. I am going to change to silver collagen today He has [name] lot more swelling in the right leg although this is not pitting not red and not particularly warm there is [name] lot of spasm in the right leg usually indicative of people with paralysis of some underlying discomfort. We have reviewed his vascular status from 2017 he had [name] left greater saphenous vein ablation. I wonder about referring him back to vascular surgery if the area on the left leg continues to deteriorate. 10/16/2018 in today for follow-up and management of multiple lower extremity ulcers. His left Buttock wound is much lower smaller and almost closed completely. The wound to the left ankle has began to reopen with Epithelialization and some adherent slough. He has multiple new areas to the left foot and leg. The left dorsal foot without much improvement. Wound present between left great webspace and 2nd toe. Erythema and edema present right leg. Right LE ultrasound obtained on 10/10/18 was negative for DVT . 10/23/2018; ooLeft buttock is closed over. Still dry macerated skin but there is no open wound. I suspect this is chronic pressure/moisture ooLeft lateral calf is quite [name] bit worse than when I saw this last. There is clearly drainage here he has macerated skin into the left plantar heel. We will change the primary dressing to alginate ooLeft dorsal foot has some improvement in overall wound  area. Still using clindamycin and silver alginate ooRight dorsal foot about the same as the left using clindamycin and silver alginate ooThe erythema in the right leg has resolved. He is DVT rule out was negative ooLeft heel pressure area required debridement although the wound is smaller and the surface is health 10/26/2018 ooThe patient came back in for his nurse check today predominantly because of the drainage coming out of the left lateral leg with [name] recent reopening of his original wound on the left lateral calf. He comes in today with [name] large amount of surrounding erythema around the wound extending from the calf into the ankle and even in the area on the dorsal foot. He is not systemically unwell. He is not febrile. Nevertheless this looks like cellulitis. We have been using silver alginate to the area. I changed him to [name] regular visit and I am going to prescribe him doxycycline. The rationale here is [name] long list of medication intolerances and [name] history of MRSA. I did not see anything that I thought would provide [name] valuable culture 10/30/2018 ooFollow-up from his appointment 4 days  ago with really an extensive area of cellulitis in the left calf left lateral ankle and left dorsal foot. I put him on doxycycline. He has [name] long list of medication allergies which are true allergy reactions. 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 In any case he is [name] lot better today. The erythema especially in the anterior and lateral left calf is better. He still has left ankle erythema. He also is complaining about increasing edema in the right leg we have only been using Kerlix Coban and he has been doing the wraps at home. Finally he has [name] spotty rash on the medial part of his upper left calf which looks like folliculitis or perhaps wrap occlusion type injury. Small superficial macules not pustules 11/06/18 patient arrives today with  again [name] considerable degree of erythema around the wound on the left lateral calf extending into the dorsal ankle and dorsal foot. This is [name] lot worse than when I saw this last week. He is on doxycycline really with not [name] lot of improvement. He has not been systemically unwell Wounds on the; left heel actually looks improved. Original area on the left foot and proximity to the first and second toes looks about the same. He has superficial areas on the dorsal foot, anterior calf and then the reopening of his original wound on the left lateral calf which looks about the same ooThe only area he has on the right is the dorsal webspace first and second which is smaller. ooHe has [name] large area of dry erythematous skin on the left buttock small open area here. 11/13/2018; the patient arrives in much better condition. The erythema around the wound on the left lateral calf is [name] lot better. Not sure whether this was the clindamycin or the TCA and ketoconazole or just in the improvement in edema control [stasis dermatitis]. In any case this is [name] lot better. The area on the left heel is very small and just about resolved using silver collagen we have been using silver alginate to the areas on his dorsal feet 11/20/2018; his wounds include the left lateral calf, left heel, dorsal aspects of both feet just proximal to the first second webspace. He is stable to slightly improved. I did not think any changes to his dressings were going to be necessary 11/27/2018 he has [name] reopening on the left buttock which is surrounded by what looks like tinea or perhaps some other form of dermatitis. The area on the left dorsal foot has some erythema around it I have marked this area but I am not sure whether this is cellulitis or not. Left heel is not closed. Left calf the reopening is really slightly longer and probably worse 1/13; in general things look better and smaller except for the left dorsal foot. Area on the left heel is  just about closed, left buttock looks better only [name] small wound remains in the skin looks better [using Lotrisone] 1/20; the area on the left heel only has [name] few remaining open areas here. Left lateral calf about the same in terms of size, left dorsal foot slightly larger right lateral foot still not closed. The area on the left buttock has no open wound and the surrounding skin looks [name] lot better 1/27; the area on the left heel is closed. Left lateral calf better but still requiring extensive debridements. The area on his left buttock is closed. He still has the  open areas on the left dorsal foot which is slightly smaller in the right foot which is slightly expanded. We have been using Iodoflex on these areas as well 2/3; left heel is closed. Left lateral calf still requiring debridement using Iodoflex there is no open area on his left buttock however he has dry scaly skin over [name] large area of this. Not really responding well to the Lotrisone. Finally the areas on his dorsal feet at the level of the first second webspace are slightly smaller on the right and about the same on the left. Both of these vigorously debrided with Anasept and gauze 2/10; left heel remains closed he has dry erythematous skin over the left buttock but there is no open wound here. Left lateral leg has come in and with. Still requiring debridement we have been using Iodoflex here. Finally the area on the left dorsal foot and right dorsal foot are really about the same extremely dry callused fissured areas. He does not yet have [name] dermatology appointment 2/17; left heel remains closed. He has [name] new open area on the left buttock. The area on the left lateral calf is bigger longer and still covered in necrotic debris. No major change in his foot areas bilaterally. I am awaiting for [name] dermatologist to look on this. We have been using ketoconazole I do not know that this is been doing any good at all. 2/24; left heel remains closed.  The left buttock wound that was new reopening last week looks better. The left lateral calf appears better also although still requires debridement. The major area on his foot is the left first second also requiring debridement. We have been putting Prisma on all wounds. I do not believe that the ketoconazole has done too much good for his feet. He will use Lotrisone I am going to give him [name] 2-week course of terbinafine. We still do not have [name] dermatology appointment 3/2 left heel remains closed however there is skin over bone in this area I pointed this out to him today. The left buttock wound is epithelialized but still does not look completely stable. The area on the left leg required debridement were using silver collagen here. With regards to his feet we changed to Lotrisone last week and silver alginate. 3/9; left heel remains closed. Left buttock remains closed. The area on the right foot is essentially closed. The left foot remains unchanged. Slightly smaller on the left lateral calf. Using silver collagen to both of these areas 3/16-Left heel remains closed. Area on right foot is closed. Left lateral calf above the lateral malleolus open wound requiring debridement with easy bleeding. Left dorsal wound proximal to first toe also debrided. Left ischial area open new. Patient has been using Prisma with wrapping every 3 days. Dermatology appointment is apparently tomorrow.Patient has completed his terbinafine 2-week course with some apparent improvement according to him, there is still flaking and dry skin in his foot on the left 3/23; area on the right foot is reopened. The area on the left anterior foot is about the same still [name] very necrotic adherent surface. He still has the area on the left leg and reopening is on the left buttock. He apparently saw dermatology although I do not have [name] note. According to the patient who is usually fairly well informed they did not have any good ideas. Put  him on oral terbinafine which she is been on before. 3/30; using silver collagen to all wounds. Apparently his dermatologist  put him on doxycycline and rifampin presumably some culture grew staph. I do not have this result. He remains on terbinafine although I have used terbinafine on him before 4/6; patient has had [name] fairly substantial reopening on the right foot between the first and second toes. He is finished his terbinafine and I believe is on doxycycline and rifampin still as prescribed by dermatology. We have been using silver collagen to all his wounds although the patient reports that he thinks silver alginate does better on the wounds on his buttock. 4/13; the area on his left lateral calf about the same size but it did not require debridement. ooLeft dorsal foot just proximal to the webspace between the first and second toes is about the same. Still nonviable surface. I note some superficial bronze discoloration of the dorsal part of his foot ooRight dorsal foot just proximal to the first and second toes also looks about the same. I still think there may be the same discoloration I noted above on the left ooLeft buttock wound looks about the same 4/20; left lateral calf appears to be gradually contracting using silver collagen. 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 The left dorsal foot wound is debrided of tightly adherent necrotic debris and really cleans up quite nicely. The right area is worse with expansion. I did not debride this it is now over the base of the second toe ooThe area on his left buttock is smaller no debridement is required using silver collagen 5/4; left calf continues to make good progress. ooHe arrives with erythema around the wounds on his dorsal foot which even extends to the plantar aspect. Very concerning for coexistent infection. He is finished the erythromycin I gave him for possible  erythrasma this does not seem to have helped. ooThe area on the left foot is about the same base of the dorsal toes ooIs area on the buttock looks improved on the left 5/11; left calf and left buttock continued to make good progress. Left foot is about the same to slightly improved. ooMajor problem is on the right foot. He has not had an x-ray. Deep tissue culture I did last week showed both Enterobacter and E. coli. I did not change the doxycycline I put him on empirically although neither 1 of these were plated to doxycycline. He arrives today with the erythema looking worse on both the dorsal and plantar foot. Macerated skin on the bottom of the foot. he has not been systemically unwell 5/18-Patient returns at 1 week, left calf wound appears to be making some progress, left buttock wound appears slightly worse than last time, left foot wound looks slightly better, right foot redness is marginally better. X-ray of both feet show no air or evidence of osteomyelitis. Patient is finished his Omnicef and terbinafine. He continues to have macerated skin on the bottom of the left foot as well as right 5/26; left calf wound is better, left buttock wound appears to have multiple small superficial open areas with surrounding macerated skin. X-rays that I did last time showed no evidence of osteomyelitis in either foot. He is finished cefdinir and doxycycline. I do not think that he was on terbinafine. He continues to have [name] large superficial open area on the right foot anterior dorsal and slightly between the first and second toes. I did send him to dermatology 2 months ago or so wondering about whether they would do [name] fungal scraping. I do not believe they  did but did do [name] culture. We have been using silver alginate to the toe areas, he has been using antifungals at home topically either ketoconazole or Lotrisone. We are using silver collagen on the left foot, silver alginate on the right, silver  collagen on the left lateral leg and silver alginate on the left buttock 6/1; left buttock area is healed. We have the left dorsal foot, left lateral leg and right dorsal foot. We are using silver alginate to the areas on both feet and silver collagen to the area on his left lateral calf 6/8; the left buttock apparently reopened late last week. He is not really sure how this happened. He is tolerating the terbinafine. Using silver alginate to all wounds 6/15; left buttock wound is larger than last week but still superficial. ooCame in the clinic today with [name] report of purulence from the left lateral leg I did not identify any infection ooBoth areas on his dorsal feet appear to be better. He is tolerating the terbinafine. Using silver alginate to all wounds 6/22; left buttock is about the same this week, left calf quite [name] bit better. His left foot is about the same however he comes in with erythema and warmth in the right forefoot once again. Culture that I gave him in the beginning of May showed Enterobacter and E. coli. I gave him doxycycline and things seem to improve although neither 1 of these organisms was specifically plated. 6/29; left buttock is larger and dry this week. Left lateral calf looks to me to be improved. Left dorsal foot also somewhat improved right foot completely unchanged. The erythema on the right foot is still present. He is completing the Ceftin dinner that I gave him empirically [see discussion above.) 7/6 - All wounds look to be stable and perhaps improved, the left buttock wound is slightly smaller, per patient bleeds easily, completed ceftin, the right foot redness is less, he is on terbinafine 7/13; left buttock wound about the same perhaps slightly narrower. Area on the left lateral leg continues to narrow. Left dorsal foot slightly smaller right foot about the same. We are using silver alginate on the right foot and Hydrofera Blue to the areas on the left. Unna boot  on the left 2 layer compression on the right 7/20; left buttock wound absolutely the same. Area on lateral leg continues to get better. Left dorsal foot require debridement as did the right no major change in the 7/27; left buttock wound the same size necrotic debris over the surface. The area on the lateral leg is closed once again. His left foot looks better right foot about the same although there is some involvement now of the posterior first second toe area. He is still on terbinafine which I have given him for [name] month, not certain [name] centimeter major change 06/25/19-All wounds appear to be slightly improved according to report, left buttock wound looks clean, both foot wounds have minimal to no debris the right dorsal foot has minimal slough. We are using Hydrofera Blue to the left and silver alginate to the right foot and ischial wound. 8/10-Wounds all appear to be around the same, the right forefoot distal part has some redness which was not there before, however the wound looks clean and small. Ischial wound looks about the same with no changes 8/17; his wound on the left lateral calf which was his original chronic venous insufficiency wound remains closed. Since I last saw him the areas on the left dorsal  foot right dorsal foot generally appear better but require debridement. The area on his left initial tuberosity appears somewhat larger to me perhaps hyper granulated and bleeds very easily. We have been using Hydrofera Blue to the left dorsal foot and silver alginate to everything else 8/24; left lateral calf remains closed. The areas on his dorsal feet on the webspace of the first and second toes bilaterally both look better. The area on the left buttock which is the pressure ulcer stage II slightly smaller. I change the dressing to Hydrofera Blue to all areas 8/31; left lateral calf remains closed. The area on his dorsal feet bilaterally look better. Using Hydrofera Blue. Still requiring  debridement on the left foot. No change in the left buttock pressure ulcers however 9/14; left lateral calf remains closed. Dorsal feet look quite [name] bit better than 2 weeks ago. Flaking dry skin also [name] lot better with the ammonium lactate I gave him 2 weeks ago. The area on the left buttock is improved. He states that his Roho cushion developed [name] leak and he is getting [name] new one, in the interim he is offloading this vigorously 9/21; left calf remains closed. Left heel which was [name] possible DTI looks better this week. He had macerated tissue around the left dorsal foot right foot looks satisfactory and improved left buttock wound. I changed his dressings to his feet to silver alginate bilaterally. Continuing Hydrofera Blue on the left buttock. 9/28 left calf remains closed. Left heel did not develop anything [possible DTI] dry flaking skin on the left dorsal foot. Right foot looks satisfactory. Improved left buttock wound. We are using silver alginate on his feet Hydrofera Blue on the buttock. I have asked him to go back to the Lotrisone on his feet including the wounds and surrounding areas 10/5; left calf remains closed. The areas on the left and right feet about the same. [name] lot of this is epithelialized however debris over the remaining open areas. He is using Lotrisone and silver alginate. The area on the left buttock using Hydrofera Blue 10/26. Patient has been out for 3 weeks secondary to Covid concerns. He tested negative but I think his wife tested positive. He comes in today with the left foot substantially worse, right foot about the same. Even more concerning he states that the area on his left buttock closed over but then reopened and is considerably deeper in one aspect than it was before [stage III wound] 11/2; left foot really about the same as last week. Quarter sized wound on the dorsal foot just proximal to the first second toes. Surrounding erythema with areas of denuded epithelium.  This is not really much different looking. Did not look like cellulitis this time however. ooRight foot area about the same.. We have been using silver alginate alginate on his toes ooLeft buttock still substantial irritated skin around the wound which I think looks somewhat better. We have been using Hydrofera Blue here. 11/9; left foot larger than last week and [name] very necrotic surface. Right foot I think is about the same perhaps slightly smaller. Debris around the circumference also addressed. Unfortunately on the left buttock there is been [name] decline. Satellite lesions below the major wound distally and now [name] an additional one posteriorly we have been using Hydrofera Blue but I think this is [name] pressure issue 11/16; left foot ulcer dorsally again [name] very adherent necrotic surface. Right foot is about the same. Not much change in the pressure ulcer on  his left buttock. 11/30; left foot ulcer dorsally basically the same as when I saw him 2 weeks ago. Very adherent fibrinous debris on the wound surface. Patient reports [name] lot of drainage as well. 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 We have been using Iodoflex, patient changed back to alginate because of the drainage. Area on his right dorsal foot really looks benign with [name] healthier surface certainly [name] lot better than on the left. Left buttock wounds all improved using Hydrofera Blue 12/7; left dorsal foot again no improvement. Tightly adherent debris. PCR culture I did last week only showed likely skin contaminant. I have gone ahead and done [name] punch biopsy of this which is about the last thing in terms of investigations I can think to do. He has known venous insufficiency and venous hypertension and this could be the issue here. The area on the right foot is about the same left buttock slightly worse according to our intake nurse secondary to [facility] Blue sticking to the wound 12/14; biopsy of  the left foot that I did last time showed changes that could be related to wound healing/chronic stasis dermatitis phenomenon no neoplasm. We have been using silver alginate to both feet. I change the one on the left today to Sorbact and silver alginate to his other 2 wounds 12/28; the patient arrives with the following problems; ooMajor issue is the dorsal left foot which continues to be [name] larger deeper wound area. Still with [name] completely nonviable surface ooParadoxically the area mirror image on the right on the right dorsal foot appears to be getting better. ooHe had some loss of dry denuded skin from the lower part of his original wound on the left lateral calf. Some of this area looked [name] little vulnerable and for this reason we put him in wrap that on this side this week ooThe area on his left buttock is larger. He still has the erythematous circular area which I think is [name] combination of pressure, sweat. This does not look like cellulitis or fungal dermatitis 11/26/2019; -Dorsal left foot large open wound with depth. Still debris over the surface. Using Sorbact ooThe area on the dorsal right foot paradoxically has closed over [facility] has [name] reopening on the left ankle laterally at the base of his original wound that extended up into the calf. This appears clean. ooThe left buttock wound is smaller but with very adherent necrotic debris over the surface. We have been using silver alginate here as well The patient had arterial studies done in 2017. He had biphasic waveforms at the dorsalis pedis and posterior tibial bilaterally. ABI in the left was 1.17. Digit waveforms were dampened. He has slight spasticity in the great toes I do not think [name] TBI would be possible 1/11; the patient comes in today with [name] sizable reopening between the first and second toes on the right. This is not exactly in the same location where we have been treating wounds previously. According to our intake nurse this was  actually fairly deep but 0.6 cm. The area on the left dorsal foot looks about the same the surface is somewhat cleaner using Sorbact, his MRI is in 2 days. We have not managed yet to get arterial studies. The new reopening on the left lateral calf looks somewhat better using alginate. The left buttock wound is about the same using alginate 1/18; the patient had his ARTERIAL studies which were quite normal. ABI in the  right at 1.13 with triphasic/biphasic waveforms on the left ABI 1.06 again with triphasic/biphasic waveforms. It would not have been possible to have done [name] toe brachial index because of spasticity. We have been using Sorbac to the left foot alginate to the rest of his wounds on the right foot left lateral calf and left buttock 1/25; arrives in clinic with erythema and swelling of the left forefoot worse over the first MTP area. This extends laterally dorsally and but also posteriorly. Still has an area on the left lateral part of the lower part of his calf wound it is eschared and clearly not closed. ooArea on the left buttock still with surrounding irritation and erythema. ooRight foot surface wound dorsally. The area between the right and first and second toes appears better. 2/1; ooThe left foot wound is about the same. Erythema slightly better I gave him [name] week of doxycycline empirically ooRight foot wound is more extensive extending between the toes to the plantar surface ooLeft lateral calf really no open surface on the inferior part of his original wound however the entire area still looks vulnerable ooAbsolutely no improvement in the left buttock wound required debridement. 2/8; the left foot is about the same. Erythema is slightly improved I gave him clindamycin last week. ooRight foot looks better he is using Lotrimin and silver alginate ooHe has [name] breakdown in the left lateral calf. Denuded epithelium which I have removed ooLeft buttock about the same were using  Hydrofera Blue 2/15; left foot is about the same there is less surrounding erythema. Surface still has tightly adherent debris which I have debriding however not making any progress ooRight foot has [name] substantial wound on the medial right second toe between the first and second webspace. ooStill an open area on the left lateral calf distal area. ooButtock wound is about the same 2/22; left foot is about the same less surrounding erythema. Surface has adherent debris. Polymen Ag Right foot area significant wound between the first and second toes. We have been using silver alginate here Left lateral leg polymen Ag at the base of his original venous insufficiency wound ooLeft buttock some improvement here 3/1; ooRight foot is deteriorating in the first second toe webspace. Larger and more substantial. We have been using silver alginate. ooLeft dorsal foot about the same markedly adherent surface debris using PolyMem Ag ooLeft lateral calf surface debris using PolyMem AG ooLeft buttock is improved again using PolyMem Ag. ooHe is completing his terbinafine. The erythema in the foot seems better. He has been on this for 2 weeks 3/8; no improvement in any wound area in fact he has [name] small open area on the dorsal midfoot which is new this week. He has not gotten his foot x-rays yet 3/15; his x-rays were both negative for osteomyelitis of both feet. No major change in any of his wounds on the extremities however his buttock wounds are better. We have been using polymen on the buttocks, left lower leg. Iodoflex on the left foot and silver alginate on the right 3/22; arrives in clinic today with the 2 major issues are the improvement in the left dorsal foot wound which for once actually looks healthy with [name] nice healthy wound surface without debridement. Using Iodoflex here. Unfortunately on the left lateral calf which is in the distal part of his original wound he came to the clinic here for  there was purulent drainage noted some increased breakdown scattered around the original area and [name] small area proximally.  We we are using polymen here will change to silver alginate today. His buttock wound on the left is better and I think the area on the right first second toe webspace is also improved 3/29; left dorsal foot looks better. Using Iodoflex. Left ankle culture from deterioration last time grew E. coli, Enterobacter and Enterococcus. I will give him [name] course of cefdinir although that will not cover Enterococcus. The area on the right foot in the webspace of the first and second toe lateral first toe looks better. The area on his buttock is about healed Vascular appointment is on April 21. This is to look at his venous system vis--vis continued breakdown of the wounds on the left including the left lateral leg and left dorsal foot he. He has had previous ablations on this side 4/5; the area between the right first and second toes lateral aspect of the first toe looks better. Dorsal aspect of the left first toe on the left foot also improved. Unfortunately the left lateral lower leg is larger and there is [name] second satellite wound superiorly. The usual superficial abrasions on the left buttock overall better but certainly not closed 4/12; the area between the right first and second toes is improved. Dorsal aspect of the left foot also slightly smaller with [name] vibrant healthy looking surface. No real change in the left lateral leg and the left buttock wound is healed He has an unaffordable co-pay for Apl[employer]. Appointment with vein and vascular with regards to the left leg venous part of the circulation is on 4/21 4/19; we continue to see improvement in all wound areas. Although this is minor. He has his vascular appointment on 4/21. The area on the left buttock has not reopened although right in the center of this area the skin looks somewhat threatened 4/26; the left buttock is  unfortunately reopened. In general his left dorsal foot has [name] healthy surface and looks somewhat smaller although it was not measured as such. The area between his first and second toe webspace on the right as [name] small wound against the first toe. The patient saw vascular surgery. The real question I was asking was about the small saphenous vein on the left. He has previously ablated left greater saphenous vein. Nothing further was commented on on the left. Right greater saphenous vein without reflux at the saphenofemoral junction or proximal thigh there was no indication for ablation of the right greater saphenous vein duplex was negative for DVT bilaterally. They did not think there was anything from [name] vascular surgery point of view that could be offered. They ABIs within normal limits 5/3; only small open area on the left buttock. The area on the left lateral leg which was his original venous reflux is now 2 wounds both which look clean. We are using Iodoflex on the left dorsal foot which looks healthy and smaller. He is down to [name] very tiny area between the right first and second toes, using silver alginate 5/10; all of his wounds appear better. We have much better edema control in 4 layer compression on the left. This may be the factor that is allowing the left foot and left lateral calf to heal. He has external compression garments at home 04/14/20-All of his wounds are progressing well, the left forefoot is practically closed, left ischium appears to be about the same, right toe webspace is also smaller. The left lateral leg is about the same, continue using Hydrofera Blue to this, silver alginate to  the ischium, Iodoflex to the toe space on the right 6/7; most of his wounds outside of the left buttock are doing well. The area on the left lateral calf and left dorsal foot are smaller. The area on the right foot in between the first and second toe webspace is barely visible although he still says  there is some drainage here is the only reason I did not heal this out. ooUnfortunately the area on the left buttock almost looks like he has [name] skin tear from tape. He has open wound and then [name] large flap of skin that we are trying to get adherence over an area just next to the remaining wound 6/21; 2 week follow-up. I believe is been here for nurse visits. Miraculously the area between his first and second toes on the left dorsal foot is closed over. Still open on the right first second web space. The left lateral calf has 2 open areas. Distally this is more superficial. The proximal area had [name] little more depth and required debridement of adherent necrotic material. His buttock wound is actually larger we have been using silver alginate here 6/28; the patient's area on the left foot remains closed. Still open wet area between the first and second toes on the right and also extending into the plantar aspect. We have been using silver alginate in this location. He has 2 areas on the left lower leg part of his original long wounds which I think are better. We have been using Hydrofera Blue here. Hydrofera Blue to the left buttock which is stable 7/12; left foot remains closed. Left ankle is closed. May be [name] small area between his right first and second toes the only truly open area is on the left buttock. We have been using Hydrofera Blue here 7/19; patient arrives with marked deterioration especially in the left foot and ankle. We did not put him in [name] compression wrap on the left last week in fact he wore his juxta lite stockings on either side although he does not have an underlying stocking. He has [name] reopening on the left dorsal foot, left lateral ankle and [name] new area on the right dorsal ankle. More worrisome is the degree of erythema on the left foot extending on the lateral foot into the lateral lower leg on the left 7/26; the patient had erythema and drainage from the lateral left ankle last  week. Culture of this grew MRSA resistant to doxycycline and clindamycin which are the 2 antibiotics we usually use with this patient who has multiple antibiotic allergies including linezolid, trimethoprim sulfamethoxazole. I had give him an empiric doxycycline and he comes in the area certainly looks somewhat better although it is blotchy in his lower leg. He has not been systemically unwell. He has had areas on the left dorsal foot which is [name] reopening, chronic wounds on the left lateral ankle. Both of these I think are secondary to chronic venous insufficiency. The area between his first and second toes is closed as far as I can tell. He had [name] new wrap injury on the right dorsal ankle last week. Finally he has an area on the left buttock. We have been using silver alginate to everything except the left buttock we are using Hydrofera Blue 06/30/20-Patient returns at 1 week, has been given [name] sample dose pack of NUZYRA which is [name] tetracycline derivative [omadacycline], patient has completed those, we have been using silver alginate to almost all the wounds except the left  ischium where we are using Hydrofera Blue all of them look better 8/16; since I last saw the patient he has been doing well. The area on the left buttock, left lateral ankle and left foot are all closed today. He has completed the [place] I gave him last time and tolerated this well. He still has open areas on the right dorsal ankle and in the right first second toe area which we are using silver alginate. 8/23; we put him in his bilateral external compression stockings last week as he did not have anything open on either leg except for concerning area between the right first and second toe. He comes in today with an area on the left dorsal foot slightly more proximal than the original wound, the left lateral foot but this is actually [name] continuation of the area he had on the left lateral ankle from last time. As well he is opened up on  the left buttock again. 8/30; comes in today with things looking [name] lot better. The area on the left lower ankle has closed down as has the left foot but with eschar in both areas. The area on the dorsal right ankle is also epithelialized. Very little remaining of the left buttock wound. We have been using silver alginate on all wound areas 9/13; the area in the first second toe webspace on the right has fully epithelialized. He still has some vulnerable epithelium on the right and the ankle and the dorsal foot. He notes weeping. He is using his juxta lite stocking. On the left again the left dorsal foot is closed left lateral ankle is closed. We went to the juxta lite stocking here as well. ooStill vulnerable in the left buttock although only 2 small open areas remain here 9/27; 2-week follow-up. We did not look at his left leg but the patient says everything is closed. He is [name] bit disturbed by the amount of edema in his left foot he is using juxta lite stockings but asking about over the toes stockings which would be 30/40, will talk to him next time. According to him there is no open wound on either the left foot or the left ankle/calf He has an open area on the dorsal right calf which I initially point [name] wrap injury. He has superficial remaining wound on the left ischial tuberosity been using silver alginate although he says this sticks to the wound 10/5; we gave him 2-week follow-up but he called yesterday expressing some concerns about his right foot right ankle and the left buttock. He came in early. There is still no open areas on the left leg and that still in his juxta lite stocking 10/11; he only has 1 small area on the left buttock that remains measuring millimeters 1 mm. Still has the same irritated skin in this area. We recommended zinc oxide when this eventually closes and pressure relief is meticulously is he can do this. He still has an area on the dorsal part of his right first  through third toes which is [name] bit irritated and still open and on the dorsal ankle near the crease of the ankle. We have been using silver alginate and using his own stocking. He has nothing open on the left leg or foot 10/25; 2-week follow-up. Not nearly as good on the left buttock as I was hoping. For open areas with 5 looking threatened small. He has the erythematous irritated chronic skin in this area. oo1 area on the right dorsal  ankle. He reports this area bleeds easily ooRight dorsal foot just proximal to the base of his toes ooWe have been using silver alginate. 11/8; 2-week follow-up. Left buttock is about the same although I do not think the wounds are in the same location we have been using silver alginate. I have asked him to use zinc oxide on the skin around the wounds. ooHe still has [name] small area on the right dorsal ankle he reports this bleeds easily ooRight dorsal foot just proximal to the base of the toes does not have anything open although the skin is very dry and scaly ooHe has [name] new opening on the nailbed of the left great toe. Nothing on the left ankle 11/29; 3-week follow-up. Left buttock has 2 open areas. And washing of these wounds today started bleeding easily. Suggesting very friable tissue. We have been using silver alginate. Right dorsal ankle which I thought was initially [name] wrap injury we have been using silver alginate. Nothing open between the toes that I can see. He states the area on the left dorsal toe nailbed healed after the last visit in 2 or 3 days 12/13; 3-week follow-up. His left buttock now has 3 open areas but the original 2 areas are smaller using polymen here. Surrounding skin looks better. The right dorsal ankle is closed. He has [name] small opening on the right dorsal foot at the level of the third toe. In general the skin looks better here. He is wearing his juxta lite stocking on the left leg says there is nothing open 11/24/2020; 3 weeks follow-up.  His left buttock still has the 3 open areas. We have been using polymen but due to lack of response he changed to [facility] area. Surrounding skin is dry erythematous and irritated looking. There is no evidence of infection either bacterial or fungal however there is loss of surface epithelium ooHe still has very dry skin in his foot causing irritation and erythema on the dorsal part of his toes. This is not responded to prolonged courses of antifungal simply looks dry and irritated 1/24; left buttock area still looks about the same he was unable to find the triad ointment that we had suggested. The area on the right lower leg just above the dorsal ankle has reopened and the areas on the right foot between the first second and second third toes and scaling on the bottom of the foot has been about the same for quite some time now. been using silver alginate to all wound areas 2/7; left buttock wound looked quite good although not much smaller in terms of surface area surrounding skin looks better. Only [name] few dry flaking areas on the right foot in between the first and second toes the skin generally looks better here [ammonium lactate]. Finally the area on the right dorsal ankle is closed 2/21; ooThere is no open area on the right foot even between the right first and second toe. Skin around this area dorsally and plantar aspects look better. ooHe has [name] reopening of the area on the right ankle just above the crease of the ankle dorsally. I continue to think that this is probably friction from spasms may be even this time with his stocking under the compression stockings. ooWounds on his left buttock look about the same there [name] couple of areas that have reopened. He has [name] total square area of loss of epithelialization. This does not look like infection it looks like [name] contact dermatitis but I  just cannot determine to what 3/14; there is nothing on the right foot between the first and second  toes this was carefully inspected under illumination. Some chronic irritation on the dorsal part of his foot from toes 1-3 at the base. Nothing really open here substantially. Still has an area on the right foot/ankle that is actually larger and hyper granulated. His buttock area on the left is just about closed however he has chronic inflammation with loss of the surface epithelial layer 3/28; 2-week follow-up. In clinic today with [name] new wound on the left anterior mid tibia. Says this happened about 2 weeks ago. He is not really sure how wonders about the spasticity of his legs at night whether that could have caused this other than that he does not have [name] good idea. He has been using topical antibiotics and silver alginate. The area on his right dorsal ankle seems somewhat better. ooFinally everything on his left buttock is closed. 4/11; 2-week follow-up. All of his wounds are better except for the area over the ischium and left buttock which have opened up widely again. At least part of this is covered in necrotic fibrinous material another part had rolled nonviable skin. The area on the right ankle, left anterior mid tibia are both [name] lot better. He had no open wounds on either foot including the areas between the first and second toes 4/25; patient presents for 2-week follow-up. He states that the wounds are overall stable. He has no complaints today and states he is using Hydrofera Blue to open wounds. 5/9; have not seen this man in over [name] month. For my memory he has open areas on the left mid tibia and right ankle. T oday he has new open area on the right dorsal foot which we have not had [name] problem with recently. He has the sustained area on the left buttock He is also changed his insurance at the beginning of the year [employer]. We will need prior authorizations for debridement 5/23; patient presents for 2-week follow-up. He has prior authorizations for debridement. He denies any issues  in the past 2 weeks with his wound care. He has been using Hydrofera Blue to all the wounds. He does report [name] circular rash to the upper left leg that is new. He denies acute signs of infection. 6/6; 2-week follow-up. The patient has open wounds on the left buttock which are worse than the last time I saw this about [name] month ago. He also has [name] new area to me on the left anterior mid tibia with some surrounding erythema. The area on the dorsal ankle on the right is closed but I think this will be [name] friction injury every time this area is exposed to either our wraps or his compression stockings caused by unrelenting spasms in this leg. 6/20; 2-week follow-up. oo The patient has open wounds on the left buttock which is about the same. Using [facility] here. - The left mid tibia has [name] static amount of surrounding erythema. Also [name] raised area in the center. We have been using Hydrofera Blue here. ooooFinally he has broken down in his dorsal right foot extending between the first and second toes and going to the base of the first and second toe webspace. I have previously assumed that this was severe venous hypertension 7/5; 2-week follow-up oo The left buttock wound actually looks better. We are using Hydrofera Blue. He has extensive skin irritation around this area and I have not  really been able to get that any better. I have tried Lotrisone i.e. antifungals and steroids. More most recently we have just been using Coloplast really looks about the same. oo The left mid tibia which was new last week culture to have very resistant staph aureus. Not only methicillin-resistant but doxycycline resistant. The patient has [name] plethora of antibiotic allergies including sulfa, linezolid. I used topical bacitracin on this but he has not started this yet. oo In addition he has an expanding area of erythema with [name] wound on the dorsal right foot. I did [name] deep tissue culture of this area today 7/12; oo Left  buttock area actually looks better surrounding skin also looks less irritated. oo Left mid tibia looks about the same. He is using bacitracin this is not worse oo Right dorsal foot looks about the same as well. oo The left first toe also looks about the same 7/19; left buttock wound continues to improve in terms of open areas oo Left mid tibia is still concerning amount of swelling he is using bacitracin oo Dorsal left first toe somewhat smaller oo Right dorsal foot somewhat smaller 7/25; left buttock wound actually continues to improve oo Left mid tibia area has less swelling. I gave him all my samples of new Nuzyra. This seems to have helped although the wound is still open it. His abrasion closed by here oo Left dorsal great toe really no better. Still [name] very nonviable surface oo Right dorsal foot perhaps some better. oo We have been using bacitracin and silver nitrate to the areas on his lower legs and Hydrofera Blue to the area on the buttock. 8/16 oo Disappointed that his left buttock wound is actually more substantial. Apparently during the last nurse visit these were both very small. He has continued irritation to [name] large area of skin on his buttock. I have never been able to totally explain this although I think it some combination of the way he sits, pressure, moisture. He is not incontinent enough to contribute to this. oo Left dorsal great toe still fibrinous debris on the surface that I have debrided today oo Large area across the dorsal right toes. oo The area on the left anterior mid tibia has less swelling. He completed the [place]. This does not look infected although the tissue is still fried 8/30; 2-week follow-up. oo Left buttock areas not improved. We used Hydrofera Blue on this. Weeping wet with the surrounding erythema that I have not been able to control even with Lotrisone and topical Coloplast oo Left dorsal great toe looks about the same oo More  substantial area again at the base of his toes on the left which is new this week. oo Area across the dorsal right toes looks improved oo The left anterior mid tibia looks like it is trying to close 9/13; 2-week follow-up. Using silver alginate on all of his wounds. The left dorsal foot does not look any better. He has the area on the dorsal toe and also the areas at the base of all of his toes 1 through 3. On the right foot he has [name] similar pattern in [name] similar area. He has the area on his left mid tibia that looks fairly healthy. Finally the area on his left buttock looks somewhat bette 9/20; culture I did of the left foot which was [name] deep tissue culture last time showed E. coli he has erythema around this wound. Still [name] completely necrotic surface. His right dorsal foot looks  about the same. He has [name] very friable surface to the left anterior mid tibia. Both buttock wounds look better. We have been using silver alginate to all wounds 10/4; he has completed the cephalexin that I gave him last time for the left foot. He is using topical gentamicin under silver alginate silver alginate being applied to all the wounds. Unfortunately all the wounds look irritated on his dorsal right foot dorsal left foot left mid tibia. I wonder if this could be [name] silver allergy. I am going to change him to [facility] on the lower extremity. The skin on the left buttock and left posterior thigh still flaking dry and irritated. This has continued no matter what I have applied topically to this. He has [name] solitary open wound which by itself does not look too bad however the entire area of surrounding skin does not change no matter what we have applied here 10/18; the area on the left dorsal foot and right dorsal foot both look better. The area on the right extends into the plantar but not between his toes. We have been using silver alginate. He still has [name] rectangular erythematous area around the area on the left  tibia. The wound itself is very small. Finally everything on his left buttock looks [name] little larger the skin is erythematous 11/15; patient comes in with the left dorsal and right dorsal foot distally looking somewhat better. Still nonviable surface on the left foot which required debridement. He still has the area on the left anterior mid tibia although this looks somewhat better. He has [name] new area on the right lateral lower leg just above the ankle. Finally his left buttock looks terrible with multiple superficial open wounds geometric square shaped area of chronic erythema which I have not been able to sort through 11/29; right dorsal foot and left dorsal foot both look somewhat better. No debridement required. He has the fragile area on the left anterior mid tibia this looks and continues to look somewhat better. Right lateral lower leg just above the ankle we identified last time also looks better. In general the area on his left buttock looks improved. We are using Hydrofera Blue to all wound areas 12/13; right dorsal foot looks better. The area on the right lateral leg is healed. Left dorsal foot has 2 open areas both of which require debridement. The fragile area on the left anterior mid tibial looks better. Smaller area on his buttocks. Were using Hydrofera Blue 1/10; patient comes in with everything looking slightly larger and/or worse. This includes his left buttock, reopening of the left mid tibia, larger areas on the left dorsal foot and what looks to be [name] cellulitis on the right dorsal and plantar foot. We have been using Hydrofera Blue on all wounds. 1/17; right dorsal foot distally looks better today. The left foot has 2 open wounds that are about the same surrounding erythema. 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 No malignancy they did not stain  for fungus 1/24; his right distal foot is not closed dry and scaly but the wound looks like it is contracting. I did not debride anything here. Problem on his left dorsal foot with expanding erythema. Apparently there were problems last week getting the Elesa Hacker however it is now available at the [employer] but [name] week later. He is using ketoconazole and Coloplast to the  left buttock along with Hydrofera Blue this actually looks quite [name] bit better today. 1/31; right dorsal foot again is dry and scaly but looking to contract. He has been using [name] moisturizer on his feet at my request but he is not sure which 1. The left dorsal foot wounds look about the same there is erythema here that I marked last week however after course of Nuzyra it certainly is not any better but not any worse either. Finally on his left buttock the skin continues to look better he has the original wound but [name] new substantial area towards the gluteal cleft. Almost like [name] skin tear. I used scissors to remove skin and subcutaneous tissue here silver nitrate and direct pressure Objective Constitutional Sitting or standing Blood Pressure is within target range for patient.. Pulse regular and within target range for patient.Marland Kitchen Respirations regular, non-labored and within target range.. Temperature is normal and within the target range for the patient.Marland Kitchen Appears in no distress. Vitals Time Taken: 8:04 AM, Height: 70 in, Weight: 216 lbs, BMI: 31, Temperature: 98 F, Pulse: 121 bpm, Respiratory Rate: 17 breaths/min, Blood Pressure: 130/74 mmHg. General Notes: Wound exam; right foot continues to contract. I did not debride this. oo His left dorsal foot looks about the same with the wounds on the dorsal left first toe and the dorsal first MTP. The erythema is no worse than last week perhaps [name] little better. oo The area on the left anterior mid tibia looks better oo Left buttock [name] new wound medially. I used scissors to remove  skin and subcutaneous tissue this almost looks like [name] skin tear Integumentary (Hair, Skin) Wound #41R status is Open. Original cause of wound was Gradually Appeared. The date acquired was: 03/16/2020. The wound has been in treatment 92 weeks. The wound is located on the Left Ischium. The wound measures 7cm length x 4cm width x 0.1cm depth; 21.991cm^2 area and 2.199cm^3 volume. There is Fat Layer (Subcutaneous Tissue) exposed. There is [name] medium amount of sanguinous drainage noted. The wound margin is distinct with the outline attached to the wound base. There is large (67-100%) red, friable granulation within the wound bed. There is no necrotic tissue within the wound bed. Wound #51R status is Open. Original cause of wound was Trauma. The date acquired was: 01/26/2021. The wound has been in treatment 44 weeks. The wound is located on the Left,Anterior Lower Leg. The wound measures 0cm length x 0cm width x 0cm depth; 0cm^2 area and 0cm^3 volume. There is Fat Layer (Subcutaneous Tissue) exposed. There is no tunneling or undermining noted. There is [name] medium amount of serosanguineous drainage noted. The wound margin is flat and intact. There is large (67-100%) red granulation within the wound bed. There is no necrotic tissue within the wound bed. Wound #52 status is Open. Original cause of wound was Gradually Appeared. The date acquired was: 03/27/2021. The wound has been in treatment 38 weeks. The wound is located on the Right,Dorsal Foot. The wound measures 3cm length x 7cm width x 0.1cm depth; 16.493cm^2 area and 1.649cm^3 volume. There is Fat Layer (Subcutaneous Tissue) exposed. There is no tunneling or undermining noted. There is [name] medium amount of serosanguineous drainage noted. The wound margin is distinct with the outline attached to the wound base. There is small (1-33%) pink, pale granulation within the wound bed. There is [name] large (67- 100%) amount of necrotic tissue within the wound bed including  Adherent Slough. Wound #56 status is Open. Original cause of  wound was Gradually Appeared. The date acquired was: 07/11/2021. The wound has been in treatment 22 weeks. The wound is located on the Left,Dorsal Foot. The wound measures 4cm length x 2.4cm width x 0.2cm depth; 7.54cm^2 area and 1.508cm^3 volume. There is Fat Layer (Subcutaneous Tissue) exposed. There is no tunneling or undermining noted. There is [name] medium amount of serosanguineous drainage noted. The wound margin is distinct with the outline attached to the wound base. There is medium (34-66%) pink, pale granulation within the wound bed. There is [name] medium (34-66%) amount of necrotic tissue within the wound bed including Adherent Slough. Wound #57 status is Open. Original cause of wound was Gradually Appeared. The date acquired was: 08/25/2021. The wound has been in treatment 17 weeks. The wound is located on the [place]. The wound measures 3.4cm length x 0.5cm width x 0.1cm depth; 1.335cm^2 area and 0.134cm^3 volume. There is Fat Layer (Subcutaneous Tissue) exposed. There is no tunneling or undermining noted. There is [name] medium amount of serosanguineous drainage noted. The wound margin is distinct with the outline attached to the wound base. There is medium (34-66%) pink granulation within the wound bed. There is [name] medium (34-66%) amount of necrotic tissue within the wound bed including Adherent Slough. Wound #60 status is Open. Original cause of wound was Gradually Appeared. The date acquired was: 12/08/2021. The wound has been in treatment 2 weeks. The wound is located on the Right,Lateral Lower Leg. The wound measures 0cm length x 0cm width x 0cm depth; 0cm^2 area and 0cm^3 volume. There is Fat Layer (Subcutaneous Tissue) exposed. There is [name] medium amount of serosanguineous drainage noted. The wound margin is distinct with the outline attached to the wound base. There is large (67-100%) red granulation within the wound bed. There  is no necrotic tissue within the wound bed. 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 There was [name] Excisional Skin/Subcutaneous Tissue Debridement with [name] total area of 28 sq cm performed by Ricard Dillon., MD. With the following instrument(s): Scissors to remove Viable and Non-Viable tissue/material. Material removed includes Subcutaneous Tissue, Skin: Dermis, and Skin: Epidermis after achieving pain control using Lidocaine. No specimens were taken. [name] time out was conducted at 08:55, prior to the start of the procedure. [name] Minimum amount of bleeding was controlled with Silver Nitrate. The procedure was tolerated well with [name] pain level of 0 throughout and [name] pain level of 0 following the procedure. Post Debridement Measurements: 7cm length x 4cm width x 0.1cm depth; 2.199cm^3 volume. Post debridement Stage noted as Category/Stage II. Character of Wound/Ulcer Post Debridement is improved. Post procedure Diagnosis Wound #41R: Same as Pre-Procedure Plan Follow-up Appointments: Return Appointment in 1 week. - Dr. Dellia Nims Bathing/ Shower/ Hygiene: May shower and wash wound with soap and water. - on days that dressing is changed Edema Control - Lymphedema / SCD / Other: Elevate legs to the level of the heart or above for 30  minutes daily and/or when sitting, [name] frequency of: -  throughout the day Compression stocking or Garment 30-40 mm/Hg pressure to: - Juxtalite to both legs daily Off-Loading: Roho cushion for wheelchair Turn and reposition every 2 hours WOUND #41R: - Ischium Wound Laterality: Left Cleanser: Soap and Water Every Other Day/30 Days Discharge Instructions: May shower and wash wound with dial antibacterial soap and water prior to dressing change. Peri-Wound Care: Ketoconazole Cream 2% Every Other Day/30 Days Discharge Instructions: Apply Ketoconazole as directed Peri-Wound Care: Triad Hydrophilic Wound Dressing Tube, 6 (oz) Every Other Day/30 Days Discharge Instructions: Apply to periwound with each dressing change Prim Dressing: Hydrofera Blue Ready Foam, 2.5 x2.5 in (Generic) Every Other Day/30 Days ary Discharge Instructions: Apply to wound bed as instructed Secondary Dressing: ABD Pad, 5x9 Every Other Day/30 Days Discharge Instructions: Apply over primary dressing as directed. Secured With: 39M Medipore H Soft Cloth Surgical T 4 x 2 (in/yd) (Generic) Every Other Day/30 Days ape Discharge Instructions: Secure dressing with tape as directed. WOUND #51R: - Lower Leg Wound Laterality: Left, Anterior Cleanser: Soap and Water Every Other Day/30 Days Discharge Instructions: May shower and wash wound with dial antibacterial soap and water prior to dressing change. Prim Dressing: Hydrofera Blue Ready Foam, 2.5 x2.5 in (Generic) Every Other Day/30 Days ary Discharge Instructions: Apply to wound bed as instructed Secondary Dressing: Zetuvit Plus Silicone Border Dressing 4x4 (in/in) Every Other Day/30 Days Discharge Instructions: Apply silicone border over primary dressing as directed. Secured With: 39M Medipore H Soft Cloth Surgical T 4 x 2 (in/yd) (Generic) Every Other Day/30 Days ape Discharge Instructions: Secure dressing with tape as directed. WOUND #52: - Foot Wound Laterality: Dorsal, Right Cleanser: Soap and Water Every Other Day/30  Days Discharge Instructions: May shower and wash wound with dial antibacterial soap and water prior to dressing change. Prim Dressing: Hydrofera Blue Ready Foam, 2.5 x2.5 in (Generic) Every Other Day/30 Days ary Discharge Instructions: Apply to wound bed as instructed Secondary Dressing: Woven Gauze Sponge, Non-Sterile 4x4 in Every Other Day/30 Days Discharge Instructions: Apply over primary dressing as directed. Secured With: [employer], 4.5x3.1 (in/yd) Every Other Day/30 Days Discharge Instructions: Secure with Kerlix as directed. Secured With: 39M Medipore H Soft Cloth Surgical T ape, 4 x 10 (in/yd) (Generic) Every Other Day/30 Days Discharge Instructions: Secure with tape as directed. WOUND #56: - Foot Wound Laterality: Dorsal, Left Cleanser: Soap and Water Every Other Day/30 Days Discharge Instructions: May shower and wash wound with dial antibacterial soap and water prior to dressing change. Topical: Mupirocin Ointment Every Other Day/30 Days Discharge Instructions: Apply Mupirocin (Bactroban) as instructed Prim Dressing: Hydrofera Blue Classic Foam, 4x4 in (Generic) Every Other Day/30 Days ary Discharge Instructions: Moisten with saline prior to applying to wound bed Secondary Dressing: Woven Gauze Sponge, Non-Sterile 4x4 in Every Other Day/30 Days Discharge Instructions: Apply over primary dressing as directed. Secured With: [employer], 4.5x3.1 (in/yd) Every Other Day/30 Days Discharge Instructions: Secure with Kerlix as directed. Secured With: 39M Medipore H Soft Cloth Surgical T ape, 4 x 10 (in/yd) (Generic) Every Other Day/30 Days Discharge Instructions: Secure with tape as directed. WOUND #57: - Foot Wound Laterality: Plantar, Right Cleanser: Soap and Water Every Other Day/30 Days Discharge Instructions: May shower and wash wound with dial antibacterial soap and water prior to dressing change. Prim Dressing: Hydrofera Blue Ready Foam, 2.5 x2.5 in (Generic)  Every Other Day/30 Days ary Discharge Instructions: Apply to wound bed as instructed Secondary Dressing: Woven Gauze Sponge,  Non-Sterile 4x4 in Every Other Day/30 Days Discharge Instructions: Apply over primary dressing as directed. Secured With: [employer], 4.5x3.1 (in/yd) Every Other Day/30 Days Discharge Instructions: Secure with Kerlix as directed. Secured With: 68M Medipore H Soft Cloth Surgical T 4 x 2 (in/yd) (Generic) Every Other Day/30 Days ape Discharge Instructions: Secure dressing with tape as directed. WOUND #60: - Lower Leg Wound Laterality: Right, Lateral Cleanser: Soap and Water Every Other Day/30 Days Discharge Instructions: May shower and wash wound with dial antibacterial soap and water prior to dressing change. Prim Dressing: Hydrofera Blue Ready Foam, 2.5 x2.5 in (Generic) Every Other Day/30 Days ary Discharge Instructions: Apply to wound bed as instructed Secondary Dressing: Woven Gauze Sponge, Non-Sterile 4x4 in Every Other Day/30 Days Discharge Instructions: Apply over primary dressing as directed. Secured With: [employer], 4.5x3.1 (in/yd) Every Other Day/30 Days Discharge Instructions: Secure with Kerlix as directed. Secured With: 68M Medipore H Soft Cloth Surgical T 4 x 2 (in/yd) (Generic) Every Other Day/30 Days ape Discharge Instructions: Secure dressing with tape as directed. 1. Continue with HFB 2. Continue with ketoconazole and Coloplast on the surrounding skin of the left buttock. I do not think they were into the extent of the area that they were supposed to be applying this 3. Continue with Hydrofera Blue to the areas on his feet he is wearing his own stockings. I want moisturizer on the skin of his feet and I applied some steroid in the clinic Electronic Signature(s) Signed: 12/22/2021 4:28:46 PM By: Linton Ham MD Entered By: Linton Ham on 12/22/2021  10:06:13 -------------------------------------------------------------------------------- SuperBill Details Patient Name: Date of Service: [name], [name] 12/22/2021 Medical Record Number: [id number] Patient Account Number: [id number] Date of Birth/Sex: Treating RN: Sep 04, 1986 (36 y.o. [name]: [place], [place] Other Clinician: Referring Provider: Treating Provider/Extender: [name]: 311 Diagnosis Coding ICD-10 Codes Code Description I87.332 Chronic venous hypertension (idiopathic) with ulcer and inflammation of left lower extremity L97.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, stage 3 L97.821 Non-pressure chronic ulcer of other part of left lower leg limited to breakdown of skin L97.518 Non-pressure chronic ulcer of other part of right foot with other 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, 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 Non-pressure chronic ulcer of other part of left foot limited to breakdown of skin L03.115 Cellulitis of right lower limb Facility Procedures CPT4 Code: [id number] Description: 78588 - DEB SUBQ TISSUE 20 SQ CM/< ICD-10 Diagnosis Description L89.323 Pressure ulcer of left buttock, stage 3 Modifier: Quantity: 1 Physician Procedures : CPT4 Code Description Modifier [id number] - WC PHYS LEVEL 3 - EST PT ICD-10 Diagnosis Description L89.323 Pressure ulcer of left buttock, 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 L97.821  Non-pressure chronic ulcer of other part of left lower leg limited to breakdown of skin I87.332 Chronic venous hypertension (idiopathic) with ulcer and inflammation of left lower extremity Quantity: 1 : [id number] 11042 - WC PHYS SUBQ TISS 20 SQ CM ICD-10 Diagnosis Description L89.323 Pressure ulcer of left buttock, stage 3 Quantity: 1 Electronic  Signature(s) Signed: 12/22/2021 4:28:46 PM By: Linton Ham MD Entered By: Linton Ham on 12/22/2021 10:06:39

## 2021-12-29 ENCOUNTER — Encounter (HOSPITAL_BASED_OUTPATIENT_CLINIC_OR_DEPARTMENT_OTHER): Payer: BC Managed Care – PPO | Attending: Internal Medicine | Admitting: Internal Medicine

## 2021-12-29 ENCOUNTER — Other Ambulatory Visit: Payer: Self-pay

## 2021-12-29 DIAGNOSIS — L03115 Cellulitis of right lower limb: Secondary | ICD-10-CM | POA: Insufficient documentation

## 2021-12-29 DIAGNOSIS — L97522 Non-pressure chronic ulcer of other part of left foot with fat layer exposed: Secondary | ICD-10-CM | POA: Insufficient documentation

## 2021-12-29 DIAGNOSIS — L97512 Non-pressure chronic ulcer of other part of right foot with fat layer exposed: Secondary | ICD-10-CM | POA: Diagnosis not present

## 2021-12-29 DIAGNOSIS — L97821 Non-pressure chronic ulcer of other part of left lower leg limited to breakdown of skin: Secondary | ICD-10-CM | POA: Insufficient documentation

## 2021-12-29 DIAGNOSIS — L89323 Pressure ulcer of left buttock, stage 3: Secondary | ICD-10-CM | POA: Diagnosis not present

## 2021-12-29 DIAGNOSIS — I87332 Chronic venous hypertension (idiopathic) with ulcer and inflammation of left lower extremity: Secondary | ICD-10-CM | POA: Insufficient documentation

## 2021-12-29 DIAGNOSIS — G8221 Paraplegia, complete: Secondary | ICD-10-CM | POA: Diagnosis not present

## 2021-12-29 DIAGNOSIS — I872 Venous insufficiency (chronic) (peripheral): Secondary | ICD-10-CM | POA: Diagnosis not present

## 2021-12-29 NOTE — Progress Notes (Signed)
[name], [name] ([id number]) Visit Report for 12/29/2021 Arrival Information Details Patient Name: Date of Service: [name], [name] 12/29/2021 8:00 [name] Medical Record Number: [id number] Patient Account Number: [id number] Date of Birth/Sex: Treating RN: 1986-10-05 (36 y.o. [name] Primary Care Donnah Levert: [name], [name] Other Clinician: Referring Juniel Groene: Treating Chelsee Hosie/Extender: [name]: 82 Visit Information History Since Last Visit Added or deleted any medications: No Patient Arrived: Wheel Chair Any new allergies or adverse reactions: No Arrival Time: 08:08 Had [name] fall or experienced change in No Accompanied By: self activities of daily living that may affect Transfer Assistance: None risk of falls: Patient Identification Verified: Yes Signs or symptoms of abuse/neglect since last visito No Secondary Verification Process Completed: Yes Hospitalized since last visit: No Patient Requires Transmission-Based Precautions: No Implantable device outside of the clinic excluding No Patient Has Alerts: Yes cellular tissue based products placed in the center Patient Alerts: R ABI = 1.0 since last visit: L ABI = 1.1 Has Dressing in Place as Prescribed: Yes Has Compression in Place as Prescribed: Yes Pain Present Now: No Electronic Signature(s) Signed: 12/29/2021 6:03:00 PM By: Baruch Gouty RN, BSN Entered By: Baruch Gouty on 12/29/2021 08:10:16 -------------------------------------------------------------------------------- Clinic Level of Care Assessment Details Patient Name: Date of Service: [name], [name] 12/29/2021 8:00 [name] Medical Record Number: [id number] Patient Account Number: [id number] Date of Birth/Sex: Treating RN: [birth year] (36 y.o. [name] Primary Care Mallory Schaad: [name], [name] Other Clinician: Referring Willim Turnage: Treating [name] Gladd/Extender: [name]: [facility] Level  of Care Assessment Items TOOL 4 Quantity Score []  - 0 Use when only an EandM is performed on FOLLOW-UP visit ASSESSMENTS - Nursing Assessment / Reassessment X- 1 10 Reassessment of Co-morbidities (includes updates in patient status) X- 1 5 Reassessment of Adherence to Treatment Plan ASSESSMENTS - Wound and Skin [name] ssessment / Reassessment []  - 0 Simple Wound Assessment / Reassessment - one wound X- 4 5 Complex Wound Assessment / Reassessment - multiple wounds []  - 0 Dermatologic / Skin Assessment (not related to wound area) ASSESSMENTS - Focused Assessment X- 2 5 Circumferential Edema Measurements - multi extremities []  - 0 Nutritional Assessment / Counseling / Intervention X- 1 5 Lower Extremity Assessment (monofilament, tuning fork, pulses) []  - 0 Peripheral Arterial Disease Assessment (using hand held doppler) ASSESSMENTS - Ostomy and/or Continence Assessment and Care []  - 0 Incontinence Assessment and Management []  - 0 Ostomy Care Assessment and Management (repouching, etc.) PROCESS - Coordination of Care X - Simple Patient / Family Education for ongoing care 1 15 []  - 0 Complex (extensive) Patient / Family Education for ongoing care X- 1 10 Staff obtains Programmer, systems, Records, T Results / Process Orders est []  - 0 Staff telephones HHA, Nursing Homes / Clarify orders / etc []  - 0 Routine Transfer to another Facility (non-emergent condition) []  - 0 Routine Hospital Admission (non-emergent condition) []  - 0 New Admissions / Biomedical engineer / Ordering NPWT Apligraf, etc. , []  - 0 Emergency Hospital Admission (emergent condition) X- 1 10 Simple Discharge Coordination []  - 0 Complex (extensive) Discharge Coordination PROCESS - Special Needs []  - 0 Pediatric / Minor Patient Management []  - 0 Isolation Patient Management []  - 0 Hearing / Language / Visual special needs []  - 0 Assessment of Community assistance (transportation, D/C planning, etc.) []  -  0 Additional assistance / Altered mentation []  - 0 Support Surface(s) Assessment (bed, cushion, seat, etc.) INTERVENTIONS - Wound Cleansing / Measurement []  -  0 Simple Wound Cleansing - one wound X- 4 5 Complex Wound Cleansing - multiple wounds X- 1 5 Wound Imaging (photographs - any number of wounds) []  - 0 Wound Tracing (instead of photographs) []  - 0 Simple Wound Measurement - one wound X- 4 5 Complex Wound Measurement - multiple wounds INTERVENTIONS - Wound Dressings X - Small Wound Dressing one or multiple wounds 4 10 []  - 0 Medium Wound Dressing one or multiple wounds []  - 0 Large Wound Dressing one or multiple wounds []  - 0 Application of Medications - topical []  - 0 Application of Medications - injection INTERVENTIONS - Miscellaneous []  - 0 External ear exam []  - 0 Specimen Collection (cultures, biopsies, blood, body fluids, etc.) []  - 0 Specimen(s) / Culture(s) sent or taken to Lab for analysis []  - 0 Patient Transfer (multiple staff / Harrel Lemon Lift / Similar devices) []  - 0 Simple Staple / Suture removal (25 or less) []  - 0 Complex Staple / Suture removal (26 or more) []  - 0 Hypo / Hyperglycemic Management (close monitor of Blood Glucose) []  - 0 Ankle / Brachial Index (ABI) - do not check if billed separately X- 1 5 Vital Signs Has the patient been seen at the hospital within the last three years: Yes Total Score: 175 Level Of Care: New/Established - Level 5 Electronic Signature(s) Signed: 12/29/2021 6:03:00 PM By: Baruch Gouty RN, BSN Entered By: Baruch Gouty on 12/29/2021 09:12:17 -------------------------------------------------------------------------------- Lower Extremity Assessment Details Patient Name: Date of Service: [name], [name] 12/29/2021 8:00 [name] Medical Record Number: [id number] Patient Account Number: [id number] Date of Birth/Sex: Treating RN: 1986/11/20 (36 y.o. [name] Primary Care Landynn Dupler: [place], [place] Other  Clinician: Referring Anavey Coombes: Treating Arayna Illescas/Extender: [name]: 312 Edema Assessment Assessed: [Left: No] [Right: No] Edema: [Left: Yes] [Right: Yes] Calf Left: Right: Point of Measurement: 33 cm From Medial Instep 28 cm 35 cm Ankle Left: Right: Point of Measurement: 10 cm From Medial Instep 25 cm 24 cm Vascular Assessment Pulses: Dorsalis Pedis Palpable: [Left:Yes] [Right:Yes] Electronic Signature(s) Signed: 12/29/2021 6:03:00 PM By: Baruch Gouty RN, BSN Entered By: Baruch Gouty on 12/29/2021 08:25:35 -------------------------------------------------------------------------------- Multi Wound Chart Details Patient Name: Date of Service: [name], [name] 12/29/2021 8:00 [name] Medical Record Number: [id number] Patient Account Number: [id number] Date of Birth/Sex: Treating RN: 05-12-1986 (36 y.o. [name] Primary Care Shavy Beachem: [place], [place] Other Clinician: Referring Lakoda Raske: Treating Amarrah Meinhart/Extender: Dutch Gray, [name]: 312 Vital Signs Height(in): 70 Pulse(bpm): 118 Weight(lbs): 216 Blood Pressure(mmHg): 128/77 Body Mass Index(BMI): 31 Temperature(F): 98 Respiratory Rate(breaths/min): 18 Photos: [51R:No Photos] Left Ischium Left, Anterior Lower Leg Right, Dorsal Foot Wound Location: Gradually Appeared Trauma Gradually Appeared Wounding Event: Pressure Ulcer Trauma, Other Trauma, Other Primary Etiology: Sleep Apnea, Hypertension, Paraplegia [name] Sleep Apnea, Hypertension, Paraplegia Comorbid History: 03/16/2020 01/26/2021 03/27/2021 Date Acquired: 93 45 39 Weeks of Treatment: Open Healed - Epithelialized Open Wound Status: Yes Yes No Wound Recurrence: Yes No No Clustered Wound: 3 [name] [name] Clustered Quantity: 2x2.8x0.1 0x0x0 1.6x4.5x0.1 Measurements L x W x D (cm) 4.398 0 5.655 [name] (cm) : rea 0.44 0 0.565 Volume (cm) : -143.50% 100.00% -200.00% % Reduction in [name]  rea: -143.10% 100.00% -200.50% % Reduction in Volume: Category/Stage II Full Thickness Without Exposed Full Thickness Without Exposed Classification: Support Structures Support Structures Medium Medium Medium Exudate Amount: Serosanguineous Serosanguineous Serosanguineous Exudate Type: red, brown red, brown red, brown Exudate Color: Distinct, outline attached [name] Distinct, outline attached Wound  Margin: Large (67-100%) [name] Small (1-33%) Granulation Amount: Red [name] Pink, Pale Granulation Quality: None Present (0%) [name] Large (67-100%) Necrotic Amount: Fat Layer (Subcutaneous Tissue): Yes [name] Fat Layer (Subcutaneous Tissue): Yes Exposed Structures: Fascia: No Fascia: No Tendon: No Tendon: No Muscle: No Muscle: No Joint: No Joint: No Bone: No Bone: No Medium (34-66%) [name] Small (1-33%) Epithelialization: Wound Number: [id number] Photos: No Photos Left, Dorsal Foot Right, Plantar Foot Right, Lateral Lower Leg Wound Location: Gradually Appeared Gradually Appeared Gradually Appeared Wounding Event: 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 Primary Etiology: Sleep Apnea, Hypertension, Paraplegia Sleep Apnea, Hypertension, Paraplegia [name] Comorbid History: 07/11/2021 08/25/2021 12/08/2021 Date Acquired: 23 18 3  Weeks of Treatment: Open Open Healed - Epithelialized Wound Status: No No No Wound Recurrence: No No No Clustered Wound: [name] [name] [name] Clustered Quantity: 2.2x1.4x0.1 0.1x0.1x0.1 0x0x0 Measurements L x W x D (cm) 2.419 0.008 0 [name] (cm) : rea 0.242 0.001 0 Volume (cm) : 61.10% 99.20% 100.00% % Reduction in [name] rea: 61.10% 99.00% 100.00% % Reduction in Volume: Full Thickness Without Exposed Full Thickness Without Exposed Category/Stage II Classification: Support Structures Support Structures Medium Small Medium Exudate Amount: Serosanguineous Serous Serosanguineous Exudate Type: red, brown amber red, brown Exudate  Color: Distinct, outline attached Distinct, outline attached [name] Wound Margin: Medium (34-66%) Small (1-33%) [name] Granulation Amount: Pink, Pale Pink [name] Granulation Quality: Medium (34-66%) None Present (0%) [name] Necrotic Amount: Fat Layer (Subcutaneous Tissue): Yes Fat Layer (Subcutaneous Tissue): Yes [name] Exposed Structures: Fascia: No Fascia: No Tendon: No Tendon: No Muscle: No Muscle: No Joint: No Joint: No Bone: No Bone: No Small (1-33%) Large (67-100%) [name] Epithelialization: Treatment Notes Electronic Signature(s) Signed: 12/29/2021 5:02:53 PM By: Linton Ham MD Signed: 12/29/2021 6:03:00 PM By: Baruch Gouty RN, BSN Entered By: Linton Ham on 12/29/2021 09:38:40 -------------------------------------------------------------------------------- Multi-Disciplinary Care Plan Details Patient Name: Date of Service: [place], [name] 12/29/2021 8:00 [name] Medical Record Number: [id number] Patient Account Number: [id number] Date of Birth/Sex: Treating RN: [birth year] (36 y.o. [name] Primary Care Fortunato Nordin: [name], [name] Other Clinician: Referring Burhanuddin Kohlmann: Treating Lakeria Starkman/Extender: [name]: [place] reviewed with physician Active Inactive Wound/Skin Impairment Nursing Diagnoses: Impaired tissue integrity Knowledge deficit related to ulceration/compromised skin integrity Goals: Patient/caregiver will verbalize understanding of skin care regimen Date Initiated: 01/05/2016 Target Resolution Date: 01/23/2022 Goal Status: Active Ulcer/skin breakdown will have [name] volume reduction of 30% by week 4 Date Initiated: 01/05/2016 Date Inactivated: 12/22/2017 Target Resolution Date: 01/19/2018 Unmet Reason: complex wounds, Goal Status: Unmet infection Interventions: Assess patient/caregiver ability to obtain necessary supplies Assess ulceration(s) every visit Provide education on ulcer and skin  care Notes: 02/02/21: Complex Care, ongoing. Electronic Signature(s) Signed: 12/29/2021 6:03:00 PM By: Baruch Gouty RN, BSN Entered By: Baruch Gouty on 12/29/2021 08:41:08 -------------------------------------------------------------------------------- Pain Assessment Details Patient Name: Date of Service: [name], [name] 12/29/2021 8:00 [name] Medical Record Number: [id number] Patient Account Number: [id number] Date of Birth/Sex: Treating RN: 08/31/1986 (36 y.o. [name] Primary Care Tabor Bartram: [place], [place] Other Clinician: Referring Natalija Mavis: Treating Lovene Maret/Extender: [name]: 312 Active Problems Location of Pain Severity and Description of Pain Patient Has Paino No Site Locations Rate the pain. Current Pain Level: 0 Pain Management and Medication Current Pain Management: Electronic Signature(s) Signed: 12/29/2021 6:03:00 PM By: Baruch Gouty RN, BSN Entered By: Baruch Gouty on 12/29/2021 08:24:45 -------------------------------------------------------------------------------- Patient/Caregiver Education Details Patient Name: Date of Service: [name], [name] 2/7/2023andnbsp8:00 [name]  Medical Record Number: [id number] Patient Account Number: [id number] Date of Birth/Gender: Treating RN: 07-09-1986 (36 y.o. [name] Primary Care Physician: Janine Limbo Other Clinician: Referring Physician: Treating Physician/Extender: [name]: [place] Education Assessment Education Provided To: Patient Education Topics Provided Pressure: Methods: Explain/Verbal Responses: Reinforcements needed, State content correctly Wound/Skin Impairment: Methods: Explain/Verbal Responses: Reinforcements needed, State content correctly Electronic Signature(s) Signed: 12/29/2021 6:03:00 PM By: Baruch Gouty RN, BSN Entered By: Baruch Gouty on 12/29/2021  08:51:00 -------------------------------------------------------------------------------- Wound Assessment Details Patient Name: Date of Service: [name], [name] 12/29/2021 8:00 [name] Medical Record Number: [id number] Patient Account Number: [id number] Date of Birth/Sex: Treating RN: 04/10/1986 (36 y.o. [name] Primary Care Goerge Mohr: [name], [name] Other Clinician: Referring Tennyson Kallen: Treating Letti Towell/Extender: [name]: 312 Wound Status Wound Number: [id number] Primary Etiology: Pressure Ulcer Wound Location: Left Ischium Wound Status: Open Wounding Event: Gradually Appeared Comorbid History: Sleep Apnea, Hypertension, Paraplegia Date Acquired: 03/16/2020 Weeks Of Treatment: 93 Clustered Wound: Yes Photos Wound Measurements Length: (cm) 2 Width: (cm) 2.8 Depth: (cm) 0.1 Clustered Quantity: 3 Area: (cm) 4.398 Volume: (cm) 0.44 % Reduction in Area: -143.5% % Reduction in Volume: -143.1% Epithelialization: Medium (34-66%) Tunneling: No Undermining: No Wound Description Classification: Category/Stage II Wound Margin: Distinct, outline attached Exudate Amount: Medium Exudate Type: Serosanguineous Exudate Color: red, brown Foul Odor After Cleansing: No Slough/Fibrino No Wound Bed Granulation Amount: Large (67-100%) Exposed Structure Granulation Quality: Red Fascia Exposed: No Necrotic Amount: None Present (0%) Fat Layer (Subcutaneous Tissue) Exposed: Yes Tendon Exposed: No Muscle Exposed: No Joint Exposed: No Bone Exposed: No Electronic Signature(s) Signed: 12/29/2021 6:03:00 PM By: Baruch Gouty RN, BSN Entered By: Baruch Gouty on 12/29/2021 08:39:51 -------------------------------------------------------------------------------- Wound Assessment Details Patient Name: Date of Service: [name], [name] 12/29/2021 8:00 [name] Medical Record Number: [id number] Patient Account Number: [id number] Date of Birth/Sex: Treating  RN: Jun 20, 1986 (36 y.o. [name] Primary Care Jyoti Harju: [name], [name] Other Clinician: Referring Natassja Ollis: Treating Elonda Giuliano/Extender: [name]: 312 Wound Status Wound Number: [id number] Primary Etiology: Trauma, Other Wound Location: Left, Anterior Lower Leg Wound Status: Healed - Epithelialized Wounding Event: Trauma Date Acquired: 01/26/2021 Weeks Of Treatment: 45 Clustered Wound: No Wound Measurements Length: (cm) Width: (cm) Depth: (cm) Area: (cm) Volume: (cm) 0 % Reduction in Area: 100% 0 % Reduction in Volume: 100% 0 0 0 Wound Description Classification: Full Thickness Without Exposed Support Structu Exudate Amount: Medium Exudate Type: Serosanguineous Exudate Color: red, brown res Electronic Signature(s) Signed: 12/29/2021 6:03:00 PM By: Baruch Gouty RN, BSN Entered By: Baruch Gouty on 12/29/2021 08:26:41 -------------------------------------------------------------------------------- Wound Assessment Details Patient Name: Date of Service: [name], [name] 12/29/2021 8:00 [name] Medical Record Number: [id number] Patient Account Number: [id number] Date of Birth/Sex: Treating RN: Apr 07, 1986 (35 y.o. [name] Primary Care Lundon Verdejo: [name], [name] Other Clinician: Referring Zailyn Rowser: Treating Navraj Dreibelbis/Extender: [name]: 312 Wound Status Wound Number: [id number] Primary Etiology: Trauma, Other Wound Location: Right, Dorsal Foot Wound Status: Open Wounding Event: Gradually Appeared Comorbid History: Sleep Apnea, Hypertension, Paraplegia Date Acquired: 03/27/2021 Weeks Of Treatment: 39 Clustered Wound: No Photos Wound Measurements Length: (cm) 1.6 Width: (cm) 4.5 Depth: (cm) 0.1 Area: (cm) 5.655 Volume: (cm) 0.565 % Reduction in Area: -200% % Reduction in Volume: -200.5% Epithelialization: Small (1-33%) Tunneling: No Undermining: No Wound  Description Classification: Full Thickness Without Exposed Support Structures Wound Margin: Distinct, outline attached Exudate Amount: Medium Exudate Type: Serosanguineous Exudate Color:  red, brown Foul Odor After Cleansing: No Slough/Fibrino Yes Wound Bed Granulation Amount: Small (1-33%) Exposed Structure Granulation Quality: Pink, Pale Fascia Exposed: No Necrotic Amount: Large (67-100%) Fat Layer (Subcutaneous Tissue) Exposed: Yes Necrotic Quality: Adherent Slough Tendon Exposed: No Muscle Exposed: No Joint Exposed: No Bone Exposed: No Electronic Signature(s) Signed: 12/29/2021 6:03:00 PM By: Baruch Gouty RN, BSN Entered By: Baruch Gouty on 12/29/2021 08:32:18 -------------------------------------------------------------------------------- Wound Assessment Details Patient Name: Date of Service: [name], [name] 12/29/2021 8:00 [name] Medical Record Number: [id number] Patient Account Number: [id number] Date of Birth/Sex: Treating RN: [birth year] (36 y.o. [name] Primary Care Khaylee Mcevoy: [name], [name] Other Clinician: Referring Nakoma Gotwalt: Treating Edelmiro Innocent/Extender: [name]: 312 Wound Status Wound Number: [id number] Primary Etiology: Neuropathic Ulcer-Non Diabetic Wound Location: Left, Dorsal Foot Wound Status: Open Wounding Event: Gradually Appeared Comorbid History: Sleep Apnea, Hypertension, Paraplegia Date Acquired: 07/11/2021 Weeks Of Treatment: 23 Clustered Wound: No Photos Wound Measurements Length: (cm) 2.2 Width: (cm) 1.4 Depth: (cm) 0.1 Area: (cm) 2.419 Volume: (cm) 0.242 % Reduction in Area: 61.1% % Reduction in Volume: 61.1% Epithelialization: Small (1-33%) Tunneling: No Undermining: No Wound Description Classification: Full Thickness Without Exposed Support Structures Wound Margin: Distinct, outline attached Exudate Amount: Medium Exudate Type: Serosanguineous Exudate Color: red, brown Foul Odor After  Cleansing: No Slough/Fibrino Yes Wound Bed Granulation Amount: Medium (34-66%) Exposed Structure Granulation Quality: Pink, Pale Fascia Exposed: No Necrotic Amount: Medium (34-66%) Fat Layer (Subcutaneous Tissue) Exposed: Yes Necrotic Quality: Adherent Slough Tendon Exposed: No Muscle Exposed: No Joint Exposed: No Bone Exposed: No Electronic Signature(s) Signed: 12/29/2021 6:03:00 PM By: Baruch Gouty RN, BSN Entered By: Baruch Gouty on 12/29/2021 08:33:26 -------------------------------------------------------------------------------- Wound Assessment Details Patient Name: Date of Service: [name], [name] 12/29/2021 8:00 [name] Medical Record Number: [id number] Patient Account Number: [id number] Date of Birth/Sex: Treating RN: July 14, 1986 (36 y.o. [name] Primary Care Everardo Voris: [name], [name] Other Clinician: Referring Tula Schryver: Treating Brittan Mapel/Extender: [name]: 312 Wound Status Wound Number: 57 Primary Etiology: Neuropathic Ulcer-Non Diabetic Wound Location: Right, Plantar Foot Wound Status: Open Wounding Event: Gradually Appeared Comorbid History: Sleep Apnea, Hypertension, Paraplegia Date Acquired: 08/25/2021 Weeks Of Treatment: 18 Clustered Wound: No Photos Wound Measurements Length: (cm) 0.1 Width: (cm) 0.1 Depth: (cm) 0.1 Area: (cm) 0.008 Volume: (cm) 0.001 % Reduction in Area: 99.2% % Reduction in Volume: 99% Epithelialization: Large (67-100%) Tunneling: No Undermining: No Wound Description Classification: Full Thickness Without Exposed Support Structures Wound Margin: Distinct, outline attached Exudate Amount: Small Exudate Type: Serous Exudate Color: amber Foul Odor After Cleansing: No Slough/Fibrino No Wound Bed Granulation Amount: Small (1-33%) Exposed Structure Granulation Quality: Pink Fascia Exposed: No Necrotic Amount: None Present (0%) Fat Layer (Subcutaneous Tissue) Exposed:  Yes Tendon Exposed: No Muscle Exposed: No Joint Exposed: No Bone Exposed: No Electronic Signature(s) Signed: 12/29/2021 6:03:00 PM By: Baruch Gouty RN, BSN Entered By: Baruch Gouty on 12/29/2021 08:34:56 -------------------------------------------------------------------------------- Wound Assessment Details Patient Name: Date of Service: [place], [name] 12/29/2021 8:00 [name] Medical Record Number: [id number] Patient Account Number: [id number] Date of Birth/Sex: Treating RN: 05-15-1986 (36 y.o. [name] Primary Care Shann Lewellyn: [place], [place] Other Clinician: Referring Coltyn Hanning: Treating Tristan Bramble/Extender: [name]: 312 Wound Status Wound Number: 60 Primary Etiology: Pressure Ulcer Wound Location: Right, Lateral Lower Leg Wound Status: Healed - Epithelialized Wounding Event: Gradually Appeared Date Acquired: 12/08/2021 Weeks Of Treatment: 3 Clustered Wound: No Wound Measurements Length: (cm) Width: (cm) Depth: (cm) Area: (cm)  Volume: (cm) 0 % Reduction in Area: 100% 0 % Reduction in Volume: 100% 0 0 0 Wound Description Classification: Category/Stage II Exudate Amount: Medium Exudate Type: Serosanguineous Exudate Color: red, brown Electronic Signature(s) Signed: 12/29/2021 6:03:00 PM By: Baruch Gouty RN, BSN Entered By: Baruch Gouty on 12/29/2021 08:26:41 -------------------------------------------------------------------------------- Vitals Details Patient Name: Date of Service: Adachi, [name] 12/29/2021 8:00 [name] Medical Record Number: [id number] Patient Account Number: [id number] Date of Birth/Sex: Treating RN: 1986/06/19 (36 y.o. [name] Primary Care Acelyn Basham: [place], [place] Other Clinician: Referring Won Kreuzer: Treating Winfield Caba/Extender: [name]: 312 Vital Signs Time Taken: 08:14 Temperature (F): 98 Height (in): 70 Pulse (bpm): 118 Source:  Stated Respiratory Rate (breaths/min): 18 Weight (lbs): 216 Blood Pressure (mmHg): 128/77 Source: Stated Reference Range: 80 - 120 mg / dl Body Mass Index (BMI): 31 Electronic Signature(s) Signed: 12/29/2021 6:03:00 PM By: Baruch Gouty RN, BSN Entered By: Baruch Gouty on 12/29/2021 08:14:52

## 2021-12-29 NOTE — Progress Notes (Signed)
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 lateral leg wound in terms of the surface of the wound although the dimensions are better. 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 Problematic areas on the plantar aspect of the first second toe web space on the left and the dorsal aspect of the first second toe web space on the right. At one point I felt these were probably related to chronic fungal infections in treated him aggressively for this although we have not made any improvement here. 08/11/17; left lateral leg. Surface continues to improve with the Iodoflex although we are not seeing much improvement in overall wound dimensions. Areas on his plantar left foot and right foot show no improvement. In fact the right foot looks somewhat worse 08/18/17; left lateral leg. We changed to [facility] Blue last week after [name] prolonged course of Iodoflex which helps get the surface better. It appears that the wound with is improved. Continue with difficult areas on the left dorsal first second and plantar first second on the right  09/01/17; patient arrives in clinic today having had [name] temperature of 103 yesterday. He was seen in the ER and [facility]. The patient was concerned he could have cellulitis again in the right leg however they diagnosed him with [name] UTI and he is now on Keflex. He has [name] history of cellulitis which is been recurrent and difficult but this is been in the left leg, in the past 5 use doxycycline. He does in and out catheterizations at home which are risk factors for UTI 09/08/17; patient will be completing his Keflex this weekend. The erythema on the left leg is considerably better. He has [name] new  wound today on the medial part of the right leg small superficial almost looks like [name] skin tear. He has worsening of the area on the right dorsal first and second toe. His major area on the left lateral leg is better. Using Hydrofera Blue on all areas 09/15/17; gradual reduction in width on the long wound in the left lateral leg. No debridement required. He also has wounds on the plantar aspect of his left first second toe web space and on the dorsal aspect of the right first second toe web space. 09/22/17; there continues to be very gradual improvements in the dimensions of the left lateral leg wound. He hasn't round erythematous spot with might be pressure on his wheelchair. There is no evidence obviously of infection no purulence no warmth He has [name] dry scaled area on the plantar aspect of the left first second toe Improved area on the dorsal right first second toe. 09/29/17; left lateral leg wound continues to improve in dimensions mostly with an is still [name] fairly long but increasingly narrow wound. He has [name] dry scaled area on the plantar aspect of his left first second toe web space Increasingly concerning area on the dorsal right first second toe. In fact I am concerned today about possible cellulitis around this wound. The areas extending up his second toe and although there is deformities here almost appears to abut on the nailbed. 10/06/17; left lateral leg wound continues to make very gradual progress. Tissue culture I did from the right first second toe dorsal foot last time grew MRSA and enterococcus which was vancomycin sensitive. This was not sensitive to clindamycin or doxycycline. He is allergic to Zyvox and sulfa we have therefore arrange for him to have dalvance infusion tomorrow. He is had this in the past and tolerated it well 10/20/17; left lateral leg wound continues to make decent progress. This is certainly reduced in terms of with there is advancing epithelialization.The  cellulitis in the right foot looks better although he still has [name] deep wound in the dorsal aspect of the first second toe web space. Plantar left first toe web space on the left I think is making some progress 10/27/17; left lateral leg wound continues to make decent progress. Advancing epithelialization.using Hydrofera Blue The right first second toe web space wound is better-looking using silver alginate Improvement in the left plantar first second toe web space. Again using silver alginate 11/03/17 left lateral leg wound continues to make decent progress albeit slowly. Using [facility] The right per second toe web space continues to be [name] very problematic looking punched out wound. I obtained [name] piece of tissue for deep culture I did extensively treated this for fungus. It is difficult to imagine that this is [name] pressure area as the patient states other than going outside he doesn't really wear shoes at home The  left plantar first second toe web space looked fairly senescent. Necrotic edges. This required debridement change to [name] Blue to all wound areas 11/10/17; left lateral leg wound continues to contract. Using Hydrofera Blue On the right dorsal first second toe web space dorsally. Culture I did of this area last week grew MRSA there is not an easy oral option in this patient was multiple antibiotic allergies or intolerances. This was only [name] rare culture isolate I'm therefore going to use Bactroban under silver alginate On the left plantar first second toe web space. Debridement is required here. This is also unchanged 11/17/17; left lateral leg wound continues to contract using Hydrofera Blue this is no longer the major issue. The major concern here is the right first second toe web space. He now has an open area going from dorsally to the plantar aspect. There is now wound on the inner lateral part of the first toe. Not [name] very viable surface on this. There is erythema spreading  medially into the forefoot. No major change in the left first second toe plantar wound 11/24/17; left lateral leg wound continues to contract using Hydrofera Blue. Nice improvement today The right first second toe web space all of this looks [name] lot less angry than last week. I have given him clindamycin and topical Bactroban for MRSA and terbinafine for the possibility of underlining tinea pedis that I could not control with ketoconazole. Looks somewhat better The area on the plantar left first second toe web space is weeping with dried debris around the wound 12/01/17; left lateral leg wound continues to contract he Hydrofera Blue. It is becoming thinner in terms of with nevertheless it is making good improvement. The right first second toe web space looks less angry but still [name] large necrotic-looking wounds starting on the plantar aspect of the right foot extending between the toes and now extensively on the base of the right second toe. I gave him clindamycin and topical Bactroban for MRSA anterior benefiting for the possibility of underlying tinea pedis. Not looking better today The area on the left first/second toe looks better. Debrided of necrotic debris 12/05/17* the patient was worked in urgently today because over the weekend he found blood on his incontinence bad when he woke up. He was found to have an ulcer by his wife who does most of his wound care. He came in today for [place] to look at this. He has not had [name] history of wounds in his buttocks in spite of his paraplegia. 12/08/17; seen in follow-up today at his usual appointment. He was seen earlier this week and found to have [name] new wound on his buttock. We also follow him for wounds on the left lateral leg, left first second toe web space and right first second toe web space 12/15/17; we have been using Hydrofera Blue to the left lateral leg which has improved. The right first second toe web space has also improved. Left first second toe web  space plantar aspect looks stable. The left buttock has worsened using Santyl. Apparently the buttock has drainage 12/22/17; we have been using Hydrofera Blue to the left lateral leg which continues to improve now 2 small wounds separated by normal skin. He tells [place] he had [name] fever up to 100 yesterday he is prone to UTIs but has not noted anything different. He does in and out catheterizations. The area between the first and second toes today does not look good necrotic surface covered with what looks  to be purulent drainage and erythema extending into the third toe. I had gotten this to something that I thought look better last time however it is not look good today. He also has [name] necrotic surface over the buttock wound which is expanded. I thought there might be infection under here so I removed [name] lot of the surface with [name] #5 curet though nothing look like it really needed culturing. He is been using Santyl to this area 12/27/17; his original wound on the left lateral leg continues to improve using Hydrofera Blue. I gave him samples of Baxdella although he was unable to take them out of fear for an allergic reaction ["lump in his throat"].the culture I did of the purulent drainage from his second toe last week showed both enterococcus and [name] set Enterobacter I was also concerned about the erythema on the bottom of his foot although paradoxically although this looks somewhat better today. 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 We've been using silver alginate here. They came up today that he sleeps in [name] recliner, I'm not sure why but I asked him to stop this 01/03/18; his original wound we've been using Hydrofera Blue is now separated into 2 areas. Ulcer on his left buttock is better he is off the recliner and sleeping in bed Finally both wound areas between his first and second toes also looks some  better 01/10/18; his original wound on the left lateral leg is now separated into 2 wounds we've been using Hydrofera Blue Ulcer on his left buttock has some drainage. There is [name] small probing site going into muscle layer superiorly.using silver alginate -He arrives today with [name] deep tissue injury on the left heel The wound on the dorsal aspect of his first second toe on the left looks [name] lot betterusing silver alginate ketoconazole The area on the first second toe web space on the right also looks [name] lot bette 01/17/18; his original wound on the left lateral leg continues to progress using Hydrofera Blue Ulcer on his left buttock also is smaller surface healthier except for [name] small probing site going into the muscle layer superiorly. 2.4 cm of tunneling in this area DTI on his left heel we have only been offloading. Looks better than last week no threatened open no evidence of infection the wound on the dorsal aspect of the first second toe on the left continues to look like it's regressing we have only been using silver alginate and terbinafine orally The area in the first second toe web space on the right also looks to be [name] lot better using silver alginate and terbinafine I think this was prompted by tinea pedis 01/31/18; the patient was hospitalized in [place] last week apparently for [name] complicated UTI. He was discharged on cefepime he does in and out catheterizations. In the hospital he was discovered M I don't mild elevation of AST and ALT and the terbinafine was stopped.predictably the pressure ulcer on s his buttock looks betterusing silver alginate. The area on the left lateral leg also is better using Hydrofera Blue. The area between the first and second toes on the left better. First and second toes on the right still substantial but better. Finally the DTI on the left heel has held together and looks like it's resolving 02/07/18-he is here in follow-up evaluation for multiple ulcerations.  He has new injury to the lateral aspect of  the last issue [name] pressure ulcer, he states this is from adhesive removal trauma. He states he has tried multiple adhesive products with no success. All other ulcers appear stable. The left heel DTI is resolving. We will continue with same treatment plan and follow-up next week. 02/14/18; follow-up for multiple areas. He has [name] new area last week on the lateral aspect of his pressure ulcer more over the posterior trochanter. The original pressure ulcer looks quite stable has healthy granulation. 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 Almost fully epithelialized on the original superior area using Hydrofera Blue DTI on the left heel has peeled off this week to reveal [name] small superficial wound under denuded skin and subcutaneous tissue Both areas between the first and second toes look better including nothing open on the left 02/21/18; The patient's wounds on his left ischial tuberosity and posterior left greater trochanter actually looked better. He has [name] large area of irritation around the area which I think is contact dermatitis. I am doubtful that this is fungal His original wound on the left lateral calf continues to improve we have been using Hydrofera Blue There is no open area in the left first second toe web space although there is [name] lot of thick callus The DTI on the left heel required debridement today of necrotic surface eschar and subcutaneous tissue using silver alginate Finally the area on the right first second toe webspace continues to contract using silver alginate and ketoconazole 02/28/18 Left ischial tuberosity wounds look better using silver alginate. Original wound on the left calf only has one small open area left using Hydrofera Blue DTI on the left heel required debridement mostly removing skin from around this wound surface. Using  silver alginate The areas on the right first/second toe web space using silver alginate and ketoconazole 03/08/18 on evaluation today patient appears to be doing decently well as best I can tell in regard to his wounds. This is the first time that I have seen him as he generally is followed by Dr. Dellia Nims. With that being said none of his wounds appear to be infected he does have an area where there is some skin covering what appears to be [name] new wound on the left dorsal surface of his great toe. This is right at the nail bed. With that being said I do believe that debrided away some of the excess skin can be of benefit in this regard. Otherwise he has been tolerating the dressing changes without complication. 03/14/18; patient arrives today with the multiplicity of wounds that we are following. He has not been systemically unwell Original wound on the left lateral calf now only has 2 small open areas we've been using Hydrofera Blue which should continue The deep tissue injury on the left heel requires debridement today. We've been using silver alginate The left first second toe and the right first second toe are both are reminiscence what I think was tinea pedis. Apparently some of the callus Surface between the toes was removed last week when it started draining. Purulent drainage coming from the wound on the ischial tuberosity on the left. 03/21/18-He is here in follow-up evaluation for multiple wounds. 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 He tolerated debridement. The only change to last week's recommendations is to discontinue antifungal cream between toes. He will follow-up next week  03/28/18; following up for multiple wounds;Concern this week is streaking redness and swelling in the right foot. He is going to need antibiotics for this. 03/31/18; follow-up for right foot cellulitis. Streaking redness and swelling in the  right foot on 03/28/18. 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 I put him on clindamycin 300 mg every 6 and brought him in for [name] quick check. He has an open wound between his first and second toes on the right foot as [name] potential source. 04/04/18; Right foot cellulitis is resolving he is completing clindamycin. This is truly good news Left lateral calf wound which is initial wound only has one small open area inferiorly this is close to healing out. He has compression stockings. We will use Hydrofera Blue right down to the epithelialization of this Nonviable surface on the left heel which was initially pressure with [name] DTI. We've been using Hydrofera Blue. I'm going to switch this back to silver alginate Left first second toe/tinea pedis this looks better using silver alginate Right first second toe tinea pedis using silver alginate Large pressure ulcers on theLeft ischial tuberosity. Small wound here Looks better. I am uncertain about the surface over the large wound. Using silver alginate 04/11/18; Cellulitis in the right foot is resolved Left lateral calf wound which was his original wounds still has 2 tiny open areas remaining this is just about closed Nonviable surface on the left heel is better but still requires debridement Left first second toe/tinea pedis still open using silver alginate Right first second toe wound tinea pedis I asked him to go back to using ketoconazole and silver alginate Large pressure ulcers on the left ischial tuberosity this shear injury here is resolved. Wound is smaller. No evidence of infection using silver alginate 04/18/18; Patient arrives with an intense area of cellulitis in the right mid lower calf extending into the right heel area. Bright red and warm. Smaller area on the left anterior leg. He has [name] significant history of MRSA. He will definitely need antibioticsdoxycycline He now has 2 open areas on the left ischial tuberosity the  original large wound and now [name] satellite area which I think was above his initial satellite areas. Not [name] wonderful surface on this satellite area surrounding erythema which looks like pressure related. His left lateral calf wound again his original wound is just about closed Left heel pressure injury still requiring debridement Left first second toe looks [name] lot better using silver alginate Right first second toe also using silver alginate and ketoconazole cream also looks better 04/20/18; the patient was worked in early today out of concerns with his cellulitis on the right leg. I had started him on doxycycline. This was 2 days ago. His wife was concerned about the swelling in the area. Also concerned about the left buttock. He has not been systemically unwell no fever chills. No nausea vomiting or diarrhea 04/25/18; the patient's left buttock wound is continued to deteriorate he is using Hydrofera Blue. He is still completing clindamycin for the cellulitis on the right leg although all of this looks better. 05/02/18 Left buttock wound still with [name] lot of drainage and [name] very tightly adherent fibrinous necrotic surface. He has [name] deeper area superiorly The left lateral calf wound is still closed DTI wound on the left heel necrotic surface especially the circumference using Iodoflex Areas between his left first second toe and right first second toe both look better. Dorsally and the right first second toe he  had [name] necrotic surface although at smaller. In using silver alginate and ketoconazole. I did [name] culture last week which was [name] deep tissue culture of the reminiscence of the open wound on the right first second toe dorsally. This grew [name] few Acinetobacter and [name] few methicillin-resistant staph aureus. Nevertheless the area actually this week looked better. I didn't feel the need to specifically address this at least in terms of systemic antibiotics. 05/09/18; wounds are measuring larger more drainage per  our intake. We are using Santyl covered with alginate on the large superficial buttock wounds, Iodosorb on the left heel, ketoconazole and silver alginate to the dorsal first and second toes bilaterally. 05/16/18; The area on his left buttock better in some aspects although the area superiorly over the ischial tuberosity required an extensive debridement.using Santyl Left heel appears stable. Using Iodoflex The areas between his first and second toes are not bad however there is spreading erythema up the dorsal aspect of his left foot this looks like cellulitis again. He is insensate the erythema is really very brilliant.o Erysipelas He went to see an allergist days ago because he was itching part of this he had lab work done. This showed [name] white count of 15.1 with 70% neutrophils. Hemoglobin of 11.4 and [name] platelet count of 659,000. Last white count we had in Epic was [name] 2-1/2 years ago which was 25.9 but he was ill at the time. He was able to show me some lab work that was done by his primary physician the pattern is about the same. 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 But prompted me to go ahead and do x-rays of both feet and the pelvis rule out osteomyelitis. He also had [name] comprehensive metabolic panel this was reasonably normal his albumin was 3.7 liver function tests BUN/creatinine all normal 05/23/18; x-rays of both his feet from last week were negative for underlying pulmonary abnormality. The x-ray of his pelvis however showed mild irregularity in the left ischial which may represent some early osteomyelitis. The wound in the left ischial continues to get deeper clearly now exposed muscle. Each week necrotic surface material over this area. Whereas the rest of the wounds do not look so bad. The left ischial wound we have been using Santyl and calcium alginate T the left heel surface necrotic debris using Iodoflex o The left lateral leg is still  healed Areas on the left dorsal foot and the right dorsal foot are about the same. There is some inflammation on the left which might represent contact dermatitis, fungal dermatitis I am doubtful cellulitis although this looks better than last week 05/30/18; CT scan done at Hospital did not show any osteomyelitis or abscess. Suggested the possibility of underlying cellulitis although I don't see [name] lot of evidence of this at the bedside The wound itself on the left buttock/upper thigh actually looks somewhat better. No debridement Left heel also looks better no debridement continue Iodoflex Both dorsal first second toe spaces appear better using Lotrisone. Left still required debridement 06/06/18; Intake reported some purulent looking drainage from the left gluteal wound. Using Santyl and calcium alginate Left heel looks better although still [name] nonviable surface requiring debridement The left dorsal foot first/second webspace actually expanding and somewhat deeper. I may consider doing [name] shave biopsy of this area Right dorsal foot first/second webspace appears stable to improved. Using Lotrisone and silver alginate to both these areas 06/13/18 Left gluteal surface looks  better. Now separated in the 2 wounds. No debridement required. Still drainage. We'll continue silver alginate Left heel continues to look better with Iodoflex continue this for at least another week Of his dorsal foot wounds the area on the left still has some depth although it looks better than last week. We've been using Lotrisone and silver alginate 06/20/18 Left gluteal continues to look better healthy tissue Left heel continues to look better healthy granulation wound is smaller. He is using Iodoflex and his long as this continues continue the Iodoflex Dorsal right foot looks better unfortunately dorsal left foot does not. There is swelling and erythema of his forefoot. He had minor trauma to this several days ago but doesn't  think this was enough to have caused any tissue injury. Foot looks like cellulitis, we have had this problem before 06/27/18 on evaluation today patient appears to be doing [name] little worse in regard to his foot ulcer. 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 Both of which would really be his only options for treating this infection. In the past he has been given and effusion of Orbactiv. This is done very well for him in the past again it's one time dosing IV antibiotic therapy. Subsequently I do believe this is something we're gonna need to see about doing at this point in time. Currently his other wounds seem to be doing somewhat better in my pinion I'm pretty happy in that regard. 07/03/18 on evaluation today patient's wounds actually appear to be doing fairly well. He has been tolerating the dressing changes without complication. All in all he seems to be showing signs of improvement. In regard to the antibiotics he has been dealing with infectious disease since I saw him last week as far as getting this scheduled. In the end [facility]'s [facility] this coming Friday. In the meantime he has been continuing to perform the dressing changes in such as previous. There does not appear to be any evidence of infection worsengin at this time. 07/10/18; Since I last saw this man 2 [name] ago things have actually improved. IV antibiotics of resulted in less forefoot erythema although there is still some present. He is not systemically unwell Left buttock wounds 2 now have no depth there is increased epithelialization Using silver alginate Left heel still requires debridement using Iodoflex Left dorsal foot still with [name] sizable wound about the size of [name] border but healthy granulation Right dorsal foot still with [name]  slitlike area using silver alginate 07/18/18; the patient's cellulitis in the left foot is improved in fact I think it is on its way to resolving. Left buttock wounds 2 both look better although the larger one has hypertension granulation we've been using silver alginate Left heel has some thick circumferential redundant skin over the wound edge which will need to be removed today we've been using Iodoflex Left dorsal foot is still [name] sizable wound required debridement using silver alginate The right dorsal foot is just about closed only [name] small open area remains here 07/25/18; left foot cellulitis is resolved Left buttock wounds 2 both look better. Hyper-granulation on the major area Left heel as some debris over the surface but otherwise looks [name] healthier wound. Using silver collagen Right dorsal foot is just about closed 07/31/18; arrives with  our intake nurse worried about purulent drainage from the buttock. We had hyper-granulation here last week His buttock wounds 2 continue to look better Left heel some debris over the surface but measuring smaller. Right dorsal foot unfortunately has openings between the toes Left foot superficial wound looks less aggravated. 08/07/18 Buttock wounds continue to look better although some of her granulation and the larger medial wound. silver alginate Left heel continues to look [name] lot better.silver collagen Left foot superficial wound looks less stable. Requires debridement. He has [name] new wound superficial area on the foot on the lateral dorsal foot. Right foot looks better using silver alginate without Lotrisone 08/14/2018; patient was in the ER last week diagnosed with [name] UTI. He is now on Cefpodoxime and Macrodantin. Buttock wounds continued to be smaller. Using silver alginate Left heel continues to look better using silver collagen Left foot superficial wound looks as though it is improving Right dorsal foot area is just about healed. 08/21/2018; patient is  completed his antibiotics for his UTI. He has 2 open areas on the buttocks. There is still not closed although the surface looks satisfactory. Using silver alginate Left heel continues to improve using silver collagen The bilateral dorsal foot areas which are at the base of his first and second toes/possible tinea pedis are actually stable on the left but worse on the right. The area on the left required debridement of necrotic surface. After debridement I obtained [name] specimen for PCR culture. The right dorsal foot which is been just about healed last week is now reopened 08/28/2018; culture done on the left dorsal foot showed coag negative staph both staph epidermidis and Lugdunensis. I think this is worthwhile initiating systemic treatment. I will use doxycycline given his long list of allergies. The area on the left heel slightly improved but still requiring debridement. The large wound on the buttock is just about closed whereas the smaller one is larger. Using silver alginate in this area 09/04/2018; patient is completing his doxycycline for the left foot although this continues to be [name] very difficult wound area with very adherent necrotic debris. We are using silver alginate to all his wounds right foot left foot and the small wounds on his buttock, silver collagen on the left heel. 09/11/2018; once again this patient has intense erythema and swelling of the left forefoot. Lesser degrees of erythema in the right foot. He has [name] long list of allergies and intolerances. I will reinstitute doxycycline. 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 Using silver alginate Left heel also looks better using silver collagen Unfortunately both the areas on his feet look worse. The area on the left first second webspace is now gone through to the plantar part of his foot. The area on the left foot anteriorly is irritated with erythema and swelling in the  forefoot. 09/25/2018 His wound on the left plantar heel looks better. Using silver collagen The area on the left buttock 2 small remnant areas. One is closed one is still open. Using silver alginate The areas between both his first and second toes look worse. This in spite of long-standing antifungal therapy with ketoconazole and silver alginate which should have antifungal activity He has small areas around his original wound on the left calf one is on the bottom of the original scar tissue and one superiorly both of these are small and superficial but again given wound history in this site this is  worrisome 10/02/2018 Left plantar heel continues to gradually contract using silver collagen Left buttock wound is unchanged using silver alginate The areas on his dorsal feet between his first and second toes bilaterally look about the same. I prescribed clindamycin ointment to see if we can address chronic staph colonization and also the underlying possibility of erythrasma The left lateral lower extremity wound is actually on the lateral part of his ankle. Small open area here. We have been using silver alginate 10/09/2018; Left plantar heel continues to look healthy and contract. No debridement is required Left buttock slightly smaller with [name] tape injury wound just below which was new this week Dorsal feet somewhat improved I have been using clindamycin Left lateral looks lower extremity the actual open area looks worse although [name] lot of this is epithelialized. I am going to change to silver collagen today He has [name] lot more swelling in the right leg although this is not pitting not red and not particularly warm there is [name] lot of spasm in the right leg usually indicative of people with paralysis of some underlying discomfort. We have reviewed his vascular status from 2017 he had [name] left greater saphenous vein ablation. I wonder about referring him back to vascular surgery if the area on the left  leg continues to deteriorate. 10/16/2018 in today for follow-up and management of multiple lower extremity ulcers. His left Buttock wound is much lower smaller and almost closed completely. The wound to the left ankle has began to reopen with Epithelialization and some adherent slough. He has multiple new areas to the left foot and leg. The left dorsal foot without much improvement. Wound present between left great webspace and 2nd toe. Erythema and edema present right leg. Right LE ultrasound obtained on 10/10/18 was negative for DVT . 10/23/2018; Left buttock is closed over. Still dry macerated skin but there is no open wound. I suspect this is chronic pressure/moisture Left lateral calf is quite [name] bit worse than when I saw this last. There is clearly drainage here he has macerated skin into the left plantar heel. We will change the primary dressing to alginate Left dorsal foot has some improvement in overall wound area. Still using clindamycin and silver alginate Right dorsal foot about the same as the left using clindamycin and silver alginate The erythema in the right leg has resolved. He is DVT rule out was negative Left heel pressure area required debridement although the wound is smaller and the surface is health 10/26/2018 The patient came back in for his nurse check today predominantly because of the drainage coming out of the left lateral leg with [name] recent reopening of his original wound on the left lateral calf. He comes in today with [name] large amount of surrounding erythema around the wound extending from the calf into the ankle and even in the area on the dorsal foot. He is not systemically unwell. He is not febrile. Nevertheless this looks like cellulitis. We have been using silver alginate to the area. I changed him to [name] regular visit and I am going to prescribe him doxycycline. The rationale here is [name] long list of medication intolerances and [name] history of MRSA. I did not see anything  that I thought would provide [name] valuable culture 10/30/2018 Follow-up from his appointment 4 days ago with really an extensive area of cellulitis in the left calf left lateral ankle and left dorsal foot. I put him on doxycycline. He has [name] long list of medication allergies  which are true allergy reactions. 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 In any case he is [name] lot better today. The erythema especially in the anterior and lateral left calf is better. He still has left ankle erythema. He also is complaining about increasing edema in the right leg we have only been using Kerlix Coban and he has been doing the wraps at home. Finally he has [name] spotty rash on the medial part of his upper left calf which looks like folliculitis or perhaps wrap occlusion type injury. Small superficial macules not pustules 11/06/18 patient arrives today with again [name] considerable degree of erythema around the wound on the left lateral calf extending into the dorsal ankle and dorsal foot. This is [name] lot worse than when I saw this last week. He is on doxycycline really with not [name] lot of improvement. He has not been systemically unwell Wounds on the; left heel actually looks improved. Original area on the left foot and proximity to the first and second toes looks about the same. He has superficial areas on the dorsal foot, anterior calf and then the reopening of his original wound on the left lateral calf which looks about the same The only area he has on the right is the dorsal webspace first and second which is smaller. He has [name] large area of dry erythematous skin on the left buttock small open area here. 11/13/2018; the patient arrives in much better condition. The erythema around the wound on the left lateral calf is [name] lot better. 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 In any case this is [name] lot better. The area on the left heel is very small and just about resolved using silver collagen we have been using silver alginate to the areas on his dorsal feet 11/20/2018; his wounds include the left lateral calf, left heel, dorsal aspects of both feet just proximal to the first second webspace. He is stable to slightly improved. I did not think any changes to his dressings were going to be necessary 11/27/2018 he has [name] reopening on the left buttock which is surrounded by what looks like tinea or perhaps some other form of dermatitis. The area on the left dorsal foot has some erythema around it I have marked this area but I am not sure whether this is cellulitis or not. Left heel is not closed. Left calf the reopening is really slightly longer and probably worse 1/13; in general things look better and smaller except for the left dorsal foot. Area on the left heel is just about closed, left buttock looks better only [name] small wound remains in the skin looks better [using Lotrisone] 1/20; the area on the left heel only has [name] few remaining open areas here. Left lateral calf about the same in terms of size, left dorsal foot slightly larger right lateral foot still not closed. The area on the left buttock has no open wound and the surrounding skin looks [name] lot better 1/27; the area on the left heel is closed. Left lateral calf better but still requiring extensive debridements. The area on his left buttock is closed. He still has the open areas on the left dorsal foot which is slightly smaller in the right foot which is slightly expanded. We have been using Iodoflex on these areas as well 2/3; left heel is  closed. Left lateral calf still requiring debridement using Iodoflex there is no open area on his left buttock however he has dry scaly skin over [name] large area of this. Not really responding well to the Lotrisone. Finally the areas on his dorsal feet at the level of the  first second webspace are slightly smaller on the right and about the same on the left. Both of these vigorously debrided with Anasept and gauze 2/10; left heel remains closed he has dry erythematous skin over the left buttock but there is no open wound here. Left lateral leg has come in and with. Still requiring debridement we have been using Iodoflex here. Finally the area on the left dorsal foot and right dorsal foot are really about the same extremely dry callused fissured areas. He does not yet have [name] dermatology appointment 2/17; left heel remains closed. He has [name] new open area on the left buttock. The area on the left lateral calf is bigger longer and still covered in necrotic debris. No major change in his foot areas bilaterally. I am awaiting for [name] dermatologist to look on this. We have been using ketoconazole I do not know that this is been doing any good at all. 2/24; left heel remains closed. The left buttock wound that was new reopening last week looks better. The left lateral calf appears better also although still requires debridement. The major area on his foot is the left first second also requiring debridement. We have been putting Prisma on all wounds. I do not believe that the ketoconazole has done too much good for his feet. He will use Lotrisone I am going to give him [name] 2-week course of terbinafine. We still do not have [name] dermatology appointment 3/2 left heel remains closed however there is skin over bone in this area I pointed this out to him today. The left buttock wound is epithelialized but still does not look completely stable. The area on the left leg required debridement were using silver collagen here. With regards to his feet we changed to Lotrisone last week and silver alginate. 3/9; left heel remains closed. Left buttock remains closed. The area on the right foot is essentially closed. The left foot remains unchanged. Slightly smaller on the left lateral calf. Using  silver collagen to both of these areas 3/16-Left heel remains closed. Area on right foot is closed. Left lateral calf above the lateral malleolus open wound requiring debridement with easy bleeding. Left dorsal wound proximal to first toe also debrided. Left ischial area open new. Patient has been using Prisma with wrapping every 3 days. Dermatology appointment is apparently tomorrow.Patient has completed his terbinafine 2-week course with some apparent improvement according to him, there is still flaking and dry skin in his foot on the left 3/23; area on the right foot is reopened. The area on the left anterior foot is about the same still [name] very necrotic adherent surface. He still has the area on the left leg and reopening is on the left buttock. He apparently saw dermatology although I do not have [name] note. According to the patient who is usually fairly well informed they did not have any good ideas. Put him on oral terbinafine which she is been on before. 3/30; using silver collagen to all wounds. Apparently his dermatologist put him on doxycycline and rifampin presumably some culture grew staph. I do not have this result. He remains on terbinafine although I have used terbinafine on him before 4/6; patient has had  [name] fairly substantial reopening on the right foot between the first and second toes. He is finished his terbinafine and I believe is on doxycycline and rifampin still as prescribed by dermatology. We have been using silver collagen to all his wounds although the patient reports that he thinks silver alginate does better on the wounds on his buttock. 4/13; the area on his left lateral calf about the same size but it did not require debridement. Left dorsal foot just proximal to the webspace between the first and second toes is about the same. Still nonviable surface. I note some superficial bronze discoloration of the dorsal part of his foot Right dorsal foot just proximal to the first  and second toes also looks about the same. I still think there may be the same discoloration I noted above on the left Left buttock wound looks about the same 4/20; left lateral calf appears to be gradually contracting using silver collagen. 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 The left dorsal foot wound is debrided of tightly adherent necrotic debris and really cleans up quite nicely. The right area is worse with expansion. I did not debride this it is now over the base of the second toe The area on his left buttock is smaller no debridement is required using silver collagen 5/4; left calf continues to make good progress. He arrives with erythema around the wounds on his dorsal foot which even extends to the plantar aspect. Very concerning for coexistent infection. He is finished the erythromycin I gave him for possible erythrasma this does not seem to have helped. The area on the left foot is about the same base of the dorsal toes Is area on the buttock looks improved on the left 5/11; left calf and left buttock continued to make good progress. Left foot is about the same to slightly improved. Major problem is on the right foot. He has not had an x-ray. Deep tissue culture I did last week showed both Enterobacter and E. coli. I did not change the doxycycline I put him on empirically although neither 1 of these were plated to doxycycline. He arrives today with the erythema looking worse on both the dorsal and plantar foot. Macerated skin on the bottom of the foot. he has not been systemically unwell 5/18-Patient returns at 1 week, left calf wound appears to be making some progress, left buttock wound appears slightly worse than last time, left foot wound looks slightly better, right foot redness is marginally better. X-ray of both feet show no air or evidence of osteomyelitis. Patient is finished his Omnicef and terbinafine. He  continues to have macerated skin on the bottom of the left foot as well as right 5/26; left calf wound is better, left buttock wound appears to have multiple small superficial open areas with surrounding macerated skin. X-rays that I did last time showed no evidence of osteomyelitis in either foot. He is finished cefdinir and doxycycline. I do not think that he was on terbinafine. He continues to have [name] large superficial open area on the right foot anterior dorsal and slightly between the first and second toes. I did send him to dermatology 2 months ago or so wondering about whether they would do [name] fungal scraping. I do not believe they did but did do [name] culture. We have been using silver alginate to the toe areas, he has been using antifungals at home topically either ketoconazole or Lotrisone. We are using silver  collagen on the left foot, silver alginate on the right, silver collagen on the left lateral leg and silver alginate on the left buttock 6/1; left buttock area is healed. We have the left dorsal foot, left lateral leg and right dorsal foot. We are using silver alginate to the areas on both feet and silver collagen to the area on his left lateral calf 6/8; the left buttock apparently reopened late last week. He is not really sure how this happened. He is tolerating the terbinafine. Using silver alginate to all wounds 6/15; left buttock wound is larger than last week but still superficial. Came in the clinic today with [name] report of purulence from the left lateral leg I did not identify any infection Both areas on his dorsal feet appear to be better. He is tolerating the terbinafine. Using silver alginate to all wounds 6/22; left buttock is about the same this week, left calf quite [name] bit better. His left foot is about the same however he comes in with erythema and warmth in the right forefoot once again. Culture that I gave him in the beginning of May showed Enterobacter and E. coli. I gave him  doxycycline and things seem to improve although neither 1 of these organisms was specifically plated. 6/29; left buttock is larger and dry this week. Left lateral calf looks to me to be improved. Left dorsal foot also somewhat improved right foot completely unchanged. The erythema on the right foot is still present. He is completing the Ceftin dinner that I gave him empirically [see discussion above.) 7/6 - All wounds look to be stable and perhaps improved, the left buttock wound is slightly smaller, per patient bleeds easily, completed ceftin, the right foot redness is less, he is on terbinafine 7/13; left buttock wound about the same perhaps slightly narrower. Area on the left lateral leg continues to narrow. Left dorsal foot slightly smaller right foot about the same. We are using silver alginate on the right foot and Hydrofera Blue to the areas on the left. Unna boot on the left 2 layer compression on the right 7/20; left buttock wound absolutely the same. Area on lateral leg continues to get better. Left dorsal foot require debridement as did the right no major change in the 7/27; left buttock wound the same size necrotic debris over the surface. The area on the lateral leg is closed once again. His left foot looks better right foot about the same although there is some involvement now of the posterior first second toe area. He is still on terbinafine which I have given him for [name] month, not certain [name] centimeter major change 06/25/19-All wounds appear to be slightly improved according to report, left buttock wound looks clean, both foot wounds have minimal to no debris the right dorsal foot has minimal slough. We are using Hydrofera Blue to the left and silver alginate to the right foot and ischial wound. 8/10-Wounds all appear to be around the same, the right forefoot distal part has some redness which was not there before, however the wound looks clean and small. Ischial wound looks about the  same with no changes 8/17; his wound on the left lateral calf which was his original chronic venous insufficiency wound remains closed. Since I last saw him the areas on the left dorsal foot right dorsal foot generally appear better but require debridement. The area on his left initial tuberosity appears somewhat larger to me perhaps hyper granulated and bleeds very easily. We have been  using Hydrofera Blue to the left dorsal foot and silver alginate to everything else 8/24; left lateral calf remains closed. The areas on his dorsal feet on the webspace of the first and second toes bilaterally both look better. The area on the left buttock which is the pressure ulcer stage II slightly smaller. I change the dressing to Hydrofera Blue to all areas 8/31; left lateral calf remains closed. The area on his dorsal feet bilaterally look better. Using Hydrofera Blue. Still requiring debridement on the left foot. No change in the left buttock pressure ulcers however 9/14; left lateral calf remains closed. Dorsal feet look quite [name] bit better than 2 [name] ago. Flaking dry skin also [name] lot better with the ammonium lactate I gave him 2 [name] ago. The area on the left buttock is improved. He states that his Roho cushion developed [name] leak and he is getting [name] new one, in the interim he is offloading this vigorously 9/21; left calf remains closed. Left heel which was [name] possible DTI looks better this week. He had macerated tissue around the left dorsal foot right foot looks satisfactory and improved left buttock wound. I changed his dressings to his feet to silver alginate bilaterally. Continuing Hydrofera Blue on the left buttock. 9/28 left calf remains closed. Left heel did not develop anything [possible DTI] dry flaking skin on the left dorsal foot. Right foot looks satisfactory. Improved left buttock wound. We are using silver alginate on his feet Hydrofera Blue on the buttock. I have asked him to go back to the  Lotrisone on his feet including the wounds and surrounding areas 10/5; left calf remains closed. The areas on the left and right feet about the same. [name] lot of this is epithelialized however debris over the remaining open areas. He is using Lotrisone and silver alginate. The area on the left buttock using Hydrofera Blue 10/26. Patient has been out for 3 [name] secondary to Covid concerns. He tested negative but I think his wife tested positive. He comes in today with the left foot substantially worse, right foot about the same. Even more concerning he states that the area on his left buttock closed over but then reopened and is considerably deeper in one aspect than it was before [stage III wound] 11/2; left foot really about the same as last week. Quarter sized wound on the dorsal foot just proximal to the first second toes. Surrounding erythema with areas of denuded epithelium. This is not really much different looking. Did not look like cellulitis this time however. Right foot area about the same.. We have been using silver alginate alginate on his toes Left buttock still substantial irritated skin around the wound which I think looks somewhat better. We have been using Hydrofera Blue here. 11/9; left foot larger than last week and [name] very necrotic surface. Right foot I think is about the same perhaps slightly smaller. Debris around the circumference also addressed. Unfortunately on the left buttock there is been [name] decline. Satellite lesions below the major wound distally and now [name] an additional one posteriorly we have been using Hydrofera Blue but I think this is [name] pressure issue 11/16; left foot ulcer dorsally again [name] very adherent necrotic surface. Right foot is about the same. Not much change in the pressure ulcer on his left buttock. 11/30; left foot ulcer dorsally basically the same as when I saw him 2 [name] ago. Very adherent fibrinous debris on the wound surface. Patient reports [name] lot  of  drainage as well. 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 We have been using Iodoflex, patient changed back to alginate because of the drainage. Area on his right dorsal foot really looks benign with [name] healthier surface certainly [name] lot better than on the left. Left buttock wounds all improved using Hydrofera Blue 12/7; left dorsal foot again no improvement. Tightly adherent debris. PCR culture I did last week only showed likely skin contaminant. I have gone ahead and done [name] punch biopsy of this which is about the last thing in terms of investigations I can think to do. He has known venous insufficiency and venous hypertension and this could be the issue here. The area on the right foot is about the same left buttock slightly worse according to our intake nurse secondary to [facility] Blue sticking to the wound 12/14; biopsy of the left foot that I did last time showed changes that could be related to wound healing/chronic stasis dermatitis phenomenon no neoplasm. We have been using silver alginate to both feet. I change the one on the left today to Sorbact and silver alginate to his other 2 wounds 12/28; the patient arrives with the following problems; Major issue is the dorsal left foot which continues to be [name] larger deeper wound area. Still with [name] completely nonviable surface Paradoxically the area mirror image on the right on the right dorsal foot appears to be getting better. He had some loss of dry denuded skin from the lower part of his original wound on the left lateral calf. Some of this area looked [name] little vulnerable and for this reason we put him in wrap that on this side this week The area on his left buttock is larger. He still has the erythematous circular area which I think is [name] combination of pressure, sweat. This does not look like cellulitis or fungal dermatitis 11/26/2019; -Dorsal left foot large open wound with depth. Still  debris over the surface. Using Sorbact The area on the dorsal right foot paradoxically has closed over He has [name] reopening on the left ankle laterally at the base of his original wound that extended up into the calf. This appears clean. The left buttock wound is smaller but with very adherent necrotic debris over the surface. We have been using silver alginate here as well The patient had arterial studies done in 2017. He had biphasic waveforms at the dorsalis pedis and posterior tibial bilaterally. ABI in the left was 1.17. Digit waveforms were dampened. He has slight spasticity in the great toes I do not think [name] TBI would be possible 1/11; the patient comes in today with [name] sizable reopening between the first and second toes on the right. This is not exactly in the same location where we have been treating wounds previously. According to our intake nurse this was actually fairly deep but 0.6 cm. The area on the left dorsal foot looks about the same the surface is somewhat cleaner using Sorbact, his MRI is in 2 days. We have not managed yet to get arterial studies. The new reopening on the left lateral calf looks somewhat better using alginate. The left buttock wound is about the same using alginate 1/18; the patient had his ARTERIAL studies which were quite normal. ABI in the right at 1.13 with triphasic/biphasic waveforms on the left ABI 1.06 again with triphasic/biphasic waveforms. It would not have been possible to have done [name] toe brachial index because of spasticity. We have  been using Sorbac to the left foot alginate to the rest of his wounds on the right foot left lateral calf and left buttock 1/25; arrives in clinic with erythema and swelling of the left forefoot worse over the first MTP area. This extends laterally dorsally and but also posteriorly. Still has an area on the left lateral part of the lower part of his calf wound it is eschared and clearly not closed. Area on the left buttock  still with surrounding irritation and erythema. Right foot surface wound dorsally. The area between the right and first and second toes appears better. 2/1; The left foot wound is about the same. Erythema slightly better I gave him [name] week of doxycycline empirically Right foot wound is more extensive extending between the toes to the plantar surface Left lateral calf really no open surface on the inferior part of his original wound however the entire area still looks vulnerable Absolutely no improvement in the left buttock wound required debridement. 2/8; the left foot is about the same. Erythema is slightly improved I gave him clindamycin last week. Right foot looks better he is using Lotrimin and silver alginate He has [name] breakdown in the left lateral calf. Denuded epithelium which I have removed Left buttock about the same were using Hydrofera Blue 2/15; left foot is about the same there is less surrounding erythema. Surface still has tightly adherent debris which I have debriding however not making any progress Right foot has [name] substantial wound on the medial right second toe between the first and second webspace. Still an open area on the left lateral calf distal area. Buttock wound is about the same 2/22; left foot is about the same less surrounding erythema. Surface has adherent debris. Polymen Ag Right foot area significant wound between the first and second toes. We have been using silver alginate here Left lateral leg polymen Ag at the base of his original venous insufficiency wound Left buttock some improvement here 3/1; Right foot is deteriorating in the first second toe webspace. Larger and more substantial. We have been using silver alginate. Left dorsal foot about the same markedly adherent surface debris using PolyMem Ag Left lateral calf surface debris using PolyMem AG Left buttock is improved again using PolyMem Ag. He is completing his terbinafine. The erythema in the foot  seems better. He has been on this for 2 [name] 3/8; no improvement in any wound area in fact he has [name] small open area on the dorsal midfoot which is new this week. He has not gotten his foot x-rays yet 3/15; his x-rays were both negative for osteomyelitis of both feet. No major change in any of his wounds on the extremities however his buttock wounds are better. We have been using polymen on the buttocks, left lower leg. Iodoflex on the left foot and silver alginate on the right 3/22; arrives in clinic today with the 2 major issues are the improvement in the left dorsal foot wound which for once actually looks healthy with [name] nice healthy wound surface without debridement. Using Iodoflex here. Unfortunately on the left lateral calf which is in the distal part of his original wound he came to the clinic here for there was purulent drainage noted some increased breakdown scattered around the original area and [name] small area proximally. We we are using polymen here will change to silver alginate today. His buttock wound on the left is better and I think the area on the right first second toe webspace is  also improved 3/29; left dorsal foot looks better. Using Iodoflex. Left ankle culture from deterioration last time grew E. coli, Enterobacter and Enterococcus. I will give him [name] course of cefdinir although that will not cover Enterococcus. The area on the right foot in the webspace of the first and second toe lateral first toe looks better. The area on his buttock is about healed Vascular appointment is on April 21. This is to look at his venous system vis--vis continued breakdown of the wounds on the left including the left lateral leg and left dorsal foot he. He has had previous ablations on this side 4/5; the area between the right first and second toes lateral aspect of the first toe looks better. Dorsal aspect of the left first toe on the left foot also improved. Unfortunately the left lateral lower leg  is larger and there is [name] second satellite wound superiorly. The usual superficial abrasions on the left buttock overall better but certainly not closed 4/12; the area between the right first and second toes is improved. Dorsal aspect of the left foot also slightly smaller with [name] vibrant healthy looking surface. No real change in the left lateral leg and the left buttock wound is healed He has an unaffordable co-pay for Apligraf. Appointment with vein and vascular with regards to the left leg venous part of the circulation is on 4/21 4/19; we continue to see improvement in all wound areas. Although this is minor. He has his vascular appointment on 4/21. The area on the left buttock has not reopened although right in the center of this area the skin looks somewhat threatened 4/26; the left buttock is unfortunately reopened. In general his left dorsal foot has [name] healthy surface and looks somewhat smaller although it was not measured as such. The area between his first and second toe webspace on the right as [name] small wound against the first toe. The patient saw vascular surgery. The real question I was asking was about the small saphenous vein on the left. He has previously ablated left greater saphenous vein. Nothing further was commented on on the left. Right greater saphenous vein without reflux at the saphenofemoral junction or proximal thigh there was no indication for ablation of the right greater saphenous vein duplex was negative for DVT bilaterally. They did not think there was anything from [name] point of view that could be offered. They ABIs within normal limits 5/3; only small open area on the left buttock. The area on the left lateral leg which was his original venous reflux is now 2 wounds both which look clean. We are using Iodoflex on the left dorsal foot which looks healthy and smaller. He is down to [name] very tiny area between the right first and second toes, using  silver alginate 5/10; all of his wounds appear better. We have much better edema control in 4 layer compression on the left. This may be the factor that is allowing the left foot and left lateral calf to heal. He has external compression garments at home 04/14/20-All of his wounds are progressing well, the left forefoot is practically closed, left ischium appears to be about the same, right toe webspace is also smaller. The left lateral leg is about the same, continue using Hydrofera Blue to this, silver alginate to the ischium, Iodoflex to the toe space on the right 6/7; most of his wounds outside of the left buttock are doing well. The area on the left lateral calf and left  dorsal foot are smaller. The area on the right foot in between the first and second toe webspace is barely visible although he still says there is some drainage here is the only reason I did not heal this out. Unfortunately the area on the left buttock almost looks like he has [name] skin tear from tape. He has open wound and then [name] large flap of skin that we are trying to get adherence over an area just next to the remaining wound 6/21; 2 week follow-up. I believe is been here for nurse visits. Miraculously the area between his first and second toes on the left dorsal foot is closed over. Still open on the right first second web space. The left lateral calf has 2 open areas. Distally this is more superficial. The proximal area had [name] little more depth and required debridement of adherent necrotic material. His buttock wound is actually larger we have been using silver alginate here 6/28; the patient's area on the left foot remains closed. Still open wet area between the first and second toes on the right and also extending into the plantar aspect. We have been using silver alginate in this location. He has 2 areas on the left lower leg part of his original long wounds which I think are better. We have been using Hydrofera Blue here.  Hydrofera Blue to the left buttock which is stable 7/12; left foot remains closed. Left ankle is closed. May be [name] small area between his right first and second toes the only truly open area is on the left buttock. We have been using Hydrofera Blue here 7/19; patient arrives with marked deterioration especially in the left foot and ankle. We did not put him in [name] compression wrap on the left last week in fact he wore his juxta lite stockings on either side although he does not have an underlying stocking. He has [name] reopening on the left dorsal foot, left lateral ankle and [name] new area on the right dorsal ankle. More worrisome is the degree of erythema on the left foot extending on the lateral foot into the lateral lower leg on the left 7/26; the patient had erythema and drainage from the lateral left ankle last week. Culture of this grew MRSA resistant to doxycycline and clindamycin which are the 2 antibiotics we usually use with this patient who has multiple antibiotic allergies including linezolid, trimethoprim sulfamethoxazole. I had give him an empiric doxycycline and he comes in the area certainly looks somewhat better although it is blotchy in his lower leg. He has not been systemically unwell. He has had areas on the left dorsal foot which is [name] reopening, chronic wounds on the left lateral ankle. Both of these I think are secondary to chronic venous insufficiency. The area between his first and second toes is closed as far as I can tell. He had [name] new wrap injury on the right dorsal ankle last week. Finally he has an area on the left buttock. We have been using silver alginate to everything except the left buttock we are using Hydrofera Blue 06/30/20-Patient returns at 1 week, has been given [name] sample dose pack of NUZYRA which is [name] tetracycline derivative [omadacycline], patient has completed those, we have been using silver alginate to almost all the wounds except the left ischium where we are using  Hydrofera Blue all of them look better 8/16; since I last saw the patient he has been doing well. The area on the left buttock, left lateral  ankle and left foot are all closed today. He has completed the [place] I gave him last time and tolerated this well. He still has open areas on the right dorsal ankle and in the right first second toe area which we are using silver alginate. 8/23; we put him in his bilateral external compression stockings last week as he did not have anything open on either leg except for concerning area between the right first and second toe. He comes in today with an area on the left dorsal foot slightly more proximal than the original wound, the left lateral foot but this is actually [name] continuation of the area he had on the left lateral ankle from last time. As well he is opened up on the left buttock again. 8/30; comes in today with things looking [name] lot better. The area on the left lower ankle has closed down as has the left foot but with eschar in both areas. The area on the dorsal right ankle is also epithelialized. Very little remaining of the left buttock wound. We have been using silver alginate on all wound areas 9/13; the area in the first second toe webspace on the right has fully epithelialized. He still has some vulnerable epithelium on the right and the ankle and the dorsal foot. He notes weeping. He is using his juxta lite stocking. On the left again the left dorsal foot is closed left lateral ankle is closed. We went to the juxta lite stocking here as well. Still vulnerable in the left buttock although only 2 small open areas remain here 9/27; 2-week follow-up. We did not look at his left leg but the patient says everything is closed. He is [name] bit disturbed by the amount of edema in his left foot he is using juxta lite stockings but asking about over the toes stockings which would be 30/40, will talk to him next time. According to him there is no open wound on  either the left foot or the left ankle/calf He has an open area on the dorsal right calf which I initially point [name] wrap injury. He has superficial remaining wound on the left ischial tuberosity been using silver alginate although he says this sticks to the wound 10/5; we gave him 2-week follow-up but he called yesterday expressing some concerns about his right foot right ankle and the left buttock. He came in early. There is still no open areas on the left leg and that still in his juxta lite stocking 10/11; he only has 1 small area on the left buttock that remains measuring millimeters 1 mm. Still has the same irritated skin in this area. We recommended zinc oxide when this eventually closes and pressure relief is meticulously is he can do this. He still has an area on the dorsal part of his right first through third toes which is [name] bit irritated and still open and on the dorsal ankle near the crease of the ankle. We have been using silver alginate and using his own stocking. He has nothing open on the left leg or foot 10/25; 2-week follow-up. Not nearly as good on the left buttock as I was hoping. For open areas with 5 looking threatened small. He has the erythematous irritated chronic skin in this area. 1 area on the right dorsal ankle. He reports this area bleeds easily Right dorsal foot just proximal to the base of his toes We have been using silver alginate. 11/8; 2-week follow-up. Left buttock is about the same  although I do not think the wounds are in the same location we have been using silver alginate. I have asked him to use zinc oxide on the skin around the wounds. He still has [name] small area on the right dorsal ankle he reports this bleeds easily Right dorsal foot just proximal to the base of the toes does not have anything open although the skin is very dry and scaly He has [name] new opening on the nailbed of the left great toe. Nothing on the left ankle 11/29; 3-week follow-up. Left  buttock has 2 open areas. And washing of these wounds today started bleeding easily. Suggesting very friable tissue. We have been using silver alginate. Right dorsal ankle which I thought was initially [name] wrap injury we have been using silver alginate. Nothing open between the toes that I can see. He states the area on the left dorsal toe nailbed healed after the last visit in 2 or 3 days 12/13; 3-week follow-up. His left buttock now has 3 open areas but the original 2 areas are smaller using polymen here. Surrounding skin looks better. The right dorsal ankle is closed. He has [name] small opening on the right dorsal foot at the level of the third toe. In general the skin looks better here. He is wearing his juxta lite stocking on the left leg says there is nothing open 11/24/2020; 3 [name] follow-up. His left buttock still has the 3 open areas. We have been using polymen but due to lack of response he changed to [facility] area. Surrounding skin is dry erythematous and irritated looking. There is no evidence of infection either bacterial or fungal however there is loss of surface epithelium He still has very dry skin in his foot causing irritation and erythema on the dorsal part of his toes. This is not responded to prolonged courses of antifungal simply looks dry and irritated 1/24; left buttock area still looks about the same he was unable to find the triad ointment that we had suggested. The area on the right lower leg just above the dorsal ankle has reopened and the areas on the right foot between the first second and second third toes and scaling on the bottom of the foot has been about the same for quite some time now. been using silver alginate to all wound areas 2/7; left buttock wound looked quite good although not much smaller in terms of surface area surrounding skin looks better. Only [name] few dry flaking areas on the right foot in between the first and second toes the skin generally looks better  here [ammonium lactate]. Finally the area on the right dorsal ankle is closed 2/21; There is no open area on the right foot even between the right first and second toe. Skin around this area dorsally and plantar aspects look better. He has [name] reopening of the area on the right ankle just above the crease of the ankle dorsally. I continue to think that this is probably friction from spasms may be even this time with his stocking under the compression stockings. Wounds on his left buttock look about the same there [name] couple of areas that have reopened. He has [name] total square area of loss of epithelialization. This does not look like infection it looks like [name] contact dermatitis but I just cannot determine to what 3/14; there is nothing on the right foot between the first and second toes this was carefully inspected under illumination. Some chronic irritation on the dorsal part  of his foot from toes 1-3 at the base. Nothing really open here substantially. Still has an area on the right foot/ankle that is actually larger and hyper granulated. His buttock area on the left is just about closed however he has chronic inflammation with loss of the surface epithelial layer 3/28; 2-week follow-up. In clinic today with [name] new wound on the left anterior mid tibia. Says this happened about 2 [name] ago. He is not really sure how wonders about the spasticity of his legs at night whether that could have caused this other than that he does not have [name] good idea. He has been using topical antibiotics and silver alginate. The area on his right dorsal ankle seems somewhat better. Finally everything on his left buttock is closed. 4/11; 2-week follow-up. All of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 rifampin but this is not usually used along gentamicin but this is parenteral and again not used along. The obvious alternative is vancomycin. He has had his arterial studies. [name] T brachial index was 1.3 oe on the right. His waveforms were biphasic bilaterally. Doppler waveforms of the digit were normal in the right damp and on the left. Comment that this could've been due to extreme edema. His venous studies show reflux on both sides in the femoral popliteal veins as well as the greater and lesser saphenous veins bilaterally. Ultimately he is going to need to see vascular surgery about this issue. Hopefully when we can get his wounds and [name] little better shape. 02/19/16; the patient was able to complete [name] course of [facility]'s for MRSA in the face of multiple antibiotic allergies. Arterial studies showed an ABI of him 0.88 on the right 1.17 on the left the. Waveforms were biphasic at the posterior tibial and dorsalis pedis digital waveforms were normal. Right toe brachial index was 1.3 limited by shaking and edema. His venous study showed widespread reflux in the left  at the common femoral vein the greater and lesser saphenous vein the greater and lesser saphenous vein on the right as well as the popliteal and femoral vein. The popliteal and femoral vein on the left did not show reflux. His wounds on the right leg give healed on the left he is still using Santyl. 02/26/16; patient completed [name] treatment with Dalvance for MRSA in the wound with associated erythema. The erythema has not really resolved and I wonder if this is mostly venous  inflammation rather than cellulitis. Still using Santyl. He is approved for Apligraf 03/04/16; there is less erythema around the wound. Both wounds require aggressive surgical debridement. Not yet ready for Apligraf 03/11/16; aggressive debridement again. Not ready for Apligraf 03/18/16 aggressive debridement again. 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 Has been to see vascular surgery he is being planned for [name] venous ablation 03/25/16; aggressive debridement again of both wound areas on the left lateral leg. He is due for ablation surgery on May 22. He is much closer to being ready for an Apligraf. Has [name] new area between the left first and second toes 04/01/16 aggressive debridement done of both wounds. The new wound at the base of between his second and first toes looks stable 04/08/16; continued aggressive debridement of both wounds on the left lower leg. He goes for his venous ablation on Monday. The new wound at the base of his first and second toes dorsally appears stable. 04/15/16; wounds aggressively debridement although the base of this looks considerably better Apligraf #1. He had ablation surgery on Monday I'll need to research these records. We only have approval for four Apligraf's 04/22/16; the patient is here for [name] wound check [Apligraf last week] intake nurse concerned about erythema around the wounds. Apparently [name] significant degree of drainage. The patient has chronic venous inflammation which I think accounts for most of this however I was asked to look at this today 04/26/16; the patient came back for check of possible cellulitis in his left foot however the Apligraf dressing was inadvertently removed therefore we elected to prep the wound for [name] second Apligraf. I put him on doxycycline on 6/1 the erythema in the foot 05/03/16 we did not remove the dressing from the superior wound as this is where I put all of his last Apligraf. Surface debridement done with [name] curette of the lower  wound which looks very healthy. The area on the left foot also looks quite satisfactory at the dorsal artery at the first and second toes 05/10/16; continue Apligraf to this. Her wound, Hydrafera to the lower wound. He has [name] new area on the right second toe. Left dorsal foot firstoosecond toe also looks improved 05/24/16; wound dimensions must be smaller I was able to use Apligraf to all 3 remaining wound areas. 06/07/16 patient's last Apligraf was 2 [name] ago. He arrives today with the 2 wounds on his lateral left leg joined together. This would have to be seen as [name] negative. He also has [name] small wound in his first and second toe on the left dorsally with quite [name] bit of surrounding erythema in the first second and third toes. This looks to be infected or inflamed, very difficult clinical call. 06/21/16: lateral left leg combined wounds. Adherent surface slough area on the left dorsal foot at roughly the fourth toe looks improved 07/12/16; he now has [name] single linear wound on the lateral left leg. This does not look to be [name] lot changed from when I  lost saw this. The area on his dorsal left foot looks considerably better however. 08/02/16; no major change in the substantial area on his left lateral leg since last time. We have been using Hydrofera Blue for [name] prolonged period of time now. The area on his left foot is also unchanged from last review 07/19/16; the area on his dorsal foot on the left looks considerably smaller. He is beginning to have significant rims of epithelialization on the lateral left leg wound. This also looks better. 08/05/16; the patient came in for [name] nurse visit today. Apparently the area on his left lateral leg looks better and it was wrapped. However in general discussion the patient noted [name] new area on the dorsal aspect of his right second toe. The exact etiology of this is unclear but likely relates to pressure. 08/09/16 really the area on the left lateral leg did not really look that  healthy today perhaps slightly larger and measurements. The area on his dorsal right second toe is improved also the left foot wound looks stable to improved 08/16/16; the area on the last lateral leg did not change any of dimensions. Post debridement with [name] curet the area looked better. Left foot wound improved and the area on the dorsal right second toe is improved 08/23/16; the area on the left lateral leg may be slightly smaller both in terms of length and width. Aggressive debridement with [name] curette afterwards the tissue appears healthier. Left foot wound appears improved in the area on the dorsal right second toe is improved 08/30/16 patient developed [name] fever over the weekend and was seen in an urgent care. Felt to have [name] UTI and put on doxycycline. He has been since changed over the phone to [facility]. After we took off the wrap on his right leg today the leg is swollen warm and erythematous, probably more likely the source of the fever 09/06/16; have been using collagen to the major left leg wound, silver alginate to the area on his anterior foot/toes 09/13/16; the areas on his anterior foot/toes on both sides appear to be virtually closed. Extensive wound on the left lateral leg perhaps slightly narrower but each visit still covered an adherent surface slough 09/16/16 patient was in for his usual Thursday nurse visit however the intake nurse noted significant erythema of his dorsal right foot. He is also running [name] low- grade fever and having increasing spasms in the right leg 09/20/16 here for cellulitis involving his right great toes and forefoot. This is [name] lot better. Still requiring debridement on his left lateral leg. Santyl direct says he needs prior authorization. Therefore his wife cannot change this at home 09/30/16; the patient's extensive area on the left lateral calf and ankle perhaps somewhat better. Using Santyl. The area on the left toes is healed and I think the area on his right  dorsal foot is healed as well. There is no cellulitis or venous inflammation involving the right leg. He is going to need compression stockings here. 10/07/16; the patient's extensive wound on the left lateral calf and ankle does not measure any differently however there appears to be less adherent surface slough using Santyl and aggressive weekly debridements 10/21/16; no major change in the area on the left lateral calf. Still the same measurement still very difficult to debridement adherent slough and nonviable subcutaneous tissue. This is not really been helped by several [name] of Santyl. Previously for 2 [name] I used Iodoflex for [name] short period. [name] prolonged course  of Hydrofera Blue didn't really help. I'm not sure why I only used 2 [name] of Iodoflex on this there is no evidence of surrounding infection. He has [name] small area on the right second toe which looks as though it's progressing towards closure 10/28/16; the wounds on his toes appear to be closed. No major change in the left lateral leg wound although the surface looks somewhat better using Iodoflex. He has had previous arterial studies that were normal. He has had reflux studies and is status post ablation although I don't have any exact notes on which vein was ablated. I'll need to check the surgical record 11/04/16; he's had [name] reopening between the first and second toe on the left and right. No major change in the left lateral leg wound. There is what appears to be cellulitis of the left dorsal foot 11/18/16 the patient was hospitalized initially in [place] and then subsequently transferred to [facility] long and was admitted there from 11/09/16 through 11/12/16. He had developed progressive cellulitis on the right leg in spite of the doxycycline I gave him. I'd spoken to the hospitalist in [place] who was concerned about continuing leukocytosis. CT scan is what I suggested this was done which showed soft tissue swelling without evidence of  osteomyelitis or an underlying abscess blood cultures were negative. At [facility] he was treated with vancomycin and Primaxin and then add an infectious disease consult. He was transitioned to Ceftaroline. He has been making progressive improvement. Overall [name] severe cellulitis of the right leg. He is been using silver alginate to her original wound on the left leg. The wounds in his toes on the right are closed there is [name] small open area on the base of the left second toe 11/26/15; the patient's right leg is much better although there is still some edema here this could be reminiscent from his severe cellulitis likely on top of some degree of lymphedema. His left anterior leg wound has less surface slough as reported by her intake nurse. Small wound at the base of the left second toe 12/02/16; patient's right leg is better and there is no open wound here. His left anterior lateral leg wound continues to have [name] healthy-looking surface. Small wound at the base of the left second toe however there is erythema in the left forefoot which is worrisome 12/16/16; is no open wounds on his right leg. We took measurements for stockings. His left anterior lateral leg wound continues to have [name] healthy-looking surface. I'm not sure where we were with the Apligraf run through his insurance. We have been using Iodoflex. He has [name] thick eschar on the left first second toe interface, I suspect this may be fungal however there is no visible open 12/23/16; no open wound on his right leg. He has 2 small areas left of the linear wound that was remaining last week. We have been using Prisma, I thought I have disclosed this week, we can only look forward to next week 01/03/17; the patient had concerning areas of erythema last week, already on doxycycline for UTI through his primary doctor. The erythema is absolutely no better there is warmth and swelling both medially from the left lateral leg wound and also the dorsal left  foot. 01/06/17- Patient is here for follow-up evaluation of his left lateral leg ulcer and bilateral feet ulcers. He is on oral antibiotic therapy, tolerating that. Nursing staff and the patient states that the erythema is improved from Monday. 01/13/17; the predominant left  lateral leg wound continues to be problematic. I had put Apligraf on him earlier this month once. However he subsequently developed what appeared to be an intense cellulitis around the left lateral leg wound. I gave him Dalvance I think on 2/12 perhaps 2/13 he continues on cefdinir. The erythema is still present but the warmth and swelling is improved. I am hopeful that the cellulitis part of this control. I wouldn't be surprised if there is an element of venous inflammation as well. 01/17/17. The erythema is present but better in the left leg. His left lateral leg wound still does not have [name] viable surface buttons certain parts of this long thin wound it appears like there has been improvement in dimensions. 01/20/17; the erythema still present but much better in the left leg. I'm thinking this is his usual degree of chronic venous inflammation. The wound on the left leg looks somewhat better. Is less surface slough 01/27/17; erythema is back to the chronic venous inflammation. The wound on the left leg is somewhat better. I am back to the point where I like to try an Apligraf once again 02/10/17; slight improvement in wound dimensions. Apligraf #2. He is completing his doxycycline 02/14/17; patient arrives today having completed doxycycline last Thursday. This was supposed to be [name] nurse visit however once again he hasn't tense erythema from the medial part of his wound extending over the lower leg. Also erythema in his foot this is roughly in the same distribution as last time. He has baseline chronic venous inflammation however this is [name] lot worse than the baseline I have learned to accept the on him is baseline inflammation 02/24/17-  patient is here for follow-up evaluation. He is tolerating compression therapy. His voicing no complaints or concerns he is here anticipating an Apligraf 03/03/17; he arrives today with an adherent necrotic surface. I don't think this is surface is going to be amenable for Apligraf's. The erythema around his wound and on the left dorsal foot has resolved he is off antibiotics 03/10/17; better-looking surface today. I don't think he can tolerate Apligraf's. He tells me he had [name] wound VAC after [name] skin graft years ago to this area and they had difficulty with [name] seal. The erythema continues to be stable around this some degree of chronic venous inflammation but he also has recurrent cellulitis. We have been using Iodoflex 03/17/17; continued improvement in the surface and may be small changes in dimensions. Using Iodoflex which seems the only thing that will control his surface 03/24/17- He is here for follow up evaluation of his LLE lateral ulceration and ulcer to right dorsal foot/toe space. He is voicing no complaints or concerns, He is tolerating compression wrap. 03/31/17 arrives today with [name] much healthier looking wound on the left lower extremity. We have been using Iodoflex for [name] prolonged period of time which has for the first time prepared and adequate looking wound bed although we have not had much in the way of wound dimension improvement. He also has [name] small wound between the first and second toe on the right 04/07/17; arrives today with [name] healthy-looking wound bed and at least the top 50% of this wound appears to be now her. No debridement was required I have changed him to [facility] last week after prolonged Iodoflex. He did not do well with Apligraf's. We've had [name] re-opening between the first and second toe on the right 04/14/17; arrives today with [name] healthier looking wound bed contractions and the top  50% of this wound and some on the lesser 50%. Wound bed appears healthy. The area between  the first and second toe on the right still remains problematic 04/21/17; continued very gradual improvement. Using [facility] 04/28/17; continued very gradual improvement in the left lateral leg venous insufficiency wound. His periwound erythema is very mild. We have been using Hydrofera Blue. Wound is making progress especially in the superior 50% 05/05/17; he continues to have very gradual improvement in the left lateral venous insufficiency wound. Both in terms with an length rings are improving. I debrided this every 2 [name] with #5 curet and we have been using Hydrofera Blue and again making good progress With regards to the wounds between his right first and second toe which I thought might of been tinea pedis he is not making as much progress very dry scaly skin over the area. Also the area at the base of the left first and second toe in [name] similar condition 05/12/17; continued gradual improvement in the refractory left lateral venous insufficiency wound on the left. Dimension smaller. Surface still requiring debridement using Hydrofera Blue 05/19/17; continued gradual improvement in the refractory left lateral venous ulceration. Careful inspection of the wound bed underlying rumination suggested some degree of epithelialization over the surface no debridement indicated. Continue Hydrofera Blue difficult areas between his toes first and third on the left than first and second on the right. I'm going to change to silver alginate from silver collagen. Continue ketoconazole as I suspect underlying tinea pedis 05/26/17; left lateral leg venous insufficiency wound. We've been using Hydrofera Blue. I believe that there is expanding epithelialization over the surface of the wound albeit not coming from the wound circumference. This is [name] bit of an odd situation in which the epithelialization seems to be coming from the surface of the wound rather than in the exact circumference. There is still small open  areas mostly along the lateral margin of the wound. ooHe has unchanged areas between the left first and second and the right first second toes which I been treating for tenia pedis 06/02/17; left lateral leg venous insufficiency wound. We have been using Hydrofera Blue. Somewhat smaller from the wound circumference. The surface of the wound remains [name] bit on it almost epithelialized sedation in appearance. I use an open curette today debridement in the surface of all of this especially the edges ooSmall open wounds remaining on the dorsal right first and second toe interspace and the plantar left first second toe and her face on the left 06/09/17; wound on the left lateral leg continues to be smaller but very gradual and very dry surface using Hydrofera Blue 06/16/17 requires weekly debridements now on the left lateral leg although this continues to contract. I changed to silver collagen last week because of dryness of the wound bed. Using Iodoflex to the areas on his first and second toes/web space bilaterally 06/24/17; patient with history of paraplegia also chronic venous insufficiency with lymphedema. Has [name] very difficult wound on the left lateral leg. This has been gradually reducing in terms of with but comes in with [name] very dry adherent surface. High switch to silver collagen [name] week or so ago with hydrogel to keep the area moist. This is been refractory to multiple dressing attempts. He also has areas in his first and second toes bilaterally in the anterior and posterior web space. I had been using Iodoflex here after [name] prolonged course of silver alginate with ketoconazole was ineffective [question tinea  pedis] 07/14/17; patient arrives today with [name] very difficult adherent material over his left lateral lower leg wound. He also has surrounding erythema and poorly controlled edema. He was switched his Santyl last visit which the nurses are applying once during his doctor visit and once on [name] nurse  visit. He was also reduced to 2 layer compression I'm not exactly sure of the issue here. 07/21/17; better surface today after 1 week of Iodoflex. Significant cellulitis that we treated last week also better. [Doxycycline] 07/28/17 better surface today with now 2 [name] of Iodoflex. Significant cellulitis treated with doxycycline. He has now completed the doxycycline and he is back to his usual degree of chronic venous inflammation/stasis dermatitis. He reminds me he has had ablations surgery here 08/04/17; continued improvement with Iodoflex to the left lateral leg wound in terms of the surface of the wound although the dimensions are better. 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 Problematic areas on the plantar aspect of the first second toe web space on the left and the dorsal aspect of the first second toe web space on the right. At one point I felt these were probably related to chronic fungal infections in treated him aggressively for this although we have not made any improvement here. 08/11/17; left lateral leg. Surface continues to improve with the Iodoflex although we are not seeing much improvement in overall wound dimensions. Areas on his plantar left foot and right foot show no improvement. In fact the right foot looks somewhat worse 08/18/17; left lateral leg. We changed to [facility] Blue last week after [name] prolonged course of Iodoflex which helps get the surface better. It appears that the wound with is improved. Continue with difficult areas on the left dorsal first second and plantar first second on the right 09/01/17; patient arrives in clinic today having had [name] temperature of 103 yesterday. He was seen in the ER and [facility]. The patient was concerned he could have cellulitis again in the right leg however they diagnosed him with [name] UTI and he is now on Keflex. He has [name] history of cellulitis which is been recurrent  and difficult but this is been in the left leg, in the past 5 use doxycycline. He does in and out catheterizations at home which are risk factors for UTI 09/08/17; patient will be completing his Keflex this weekend. The erythema on the left leg is considerably better. He has [name] new wound today on the medial part of the right leg small superficial almost looks like [name] skin tear. He has worsening of the area on the right dorsal first and second toe. His major area on the left lateral leg is better. Using Hydrofera Blue on all areas 09/15/17; gradual reduction in width on the long wound in the left lateral leg. No debridement required. He also has wounds on the plantar aspect of his left first second toe web space and on the dorsal aspect of the right first second toe web space. 09/22/17; there continues to be very gradual improvements in the dimensions of the left lateral leg wound. He hasn't round erythematous spot with might be pressure on his wheelchair. There is no evidence obviously of infection no purulence no warmth ooHe has [name] dry scaled area on the plantar aspect of the left first second toe ooImproved area on the dorsal right first second toe. 09/29/17; left lateral leg wound continues to improve in dimensions mostly with an is  still [name] fairly long but increasingly narrow wound. ooHe has [name] dry scaled area on the plantar aspect of his left first second toe web space ooIncreasingly concerning area on the dorsal right first second toe. In fact I am concerned today about possible cellulitis around this wound. The areas extending up his second toe and although there is deformities here almost appears to abut on the nailbed. 10/06/17; left lateral leg wound continues to make very gradual progress. Tissue culture I did from the right first second toe dorsal foot last time grew MRSA and enterococcus which was vancomycin sensitive. This was not sensitive to clindamycin or doxycycline. He is allergic to  Zyvox and sulfa we have therefore arrange for him to have [facility] infusion tomorrow. He is had this in the past and tolerated it well 10/20/17; left lateral leg wound continues to make decent progress. This is certainly reduced in terms of with there is advancing epithelialization.ooThe cellulitis in the right foot looks better although he still has [name] deep wound in the dorsal aspect of the first second toe web space. Plantar left first toe web space on the left I think is making some progress 10/27/17; left lateral leg wound continues to make decent progress. Advancing epithelialization.using Hydrofera Blue ooThe right first second toe web space wound is better-looking using silver alginate ooImprovement in the left plantar first second toe web space. Again using silver alginate 11/03/17 left lateral leg wound continues to make decent progress albeit slowly. Using Hydrofera Blue ooThe right per second toe web space continues to be [name] very problematic looking punched out wound. I obtained [name] piece of tissue for deep culture I did extensively treated this for fungus. It is difficult to imagine that this is [name] pressure area as the patient states other than going outside he doesn't really wear shoes at home ooThe left plantar first second toe web space looked fairly senescent. Necrotic edges. This required debridement oochange to Hydrofera Blue to all wound areas 11/10/17; left lateral leg wound continues to contract. Using Hydrofera Blue ooOn the right dorsal first second toe web space dorsally. Culture I did of this area last week grew MRSA there is not an easy oral option in this patient was multiple antibiotic allergies or intolerances. This was only [name] rare culture isolate I'm therefore going to use Bactroban under silver alginate ooOn the left plantar first second toe web space. Debridement is required here. This is also unchanged 11/17/17; left lateral leg wound continues to contract using  Hydrofera Blue this is no longer the major issue. ooThe major concern here is the right first second toe web space. He now has an open area going from dorsally to the plantar aspect. There is now wound on the inner lateral part of the first toe. Not [name] very viable surface on this. There is erythema spreading medially into the forefoot. ooNo major change in the left first second toe plantar wound 11/24/17; left lateral leg wound continues to contract using Hydrofera Blue. Nice improvement today ooThe right first second toe web space all of this looks [name] lot less angry than last week. I have given him clindamycin and topical Bactroban for MRSA and terbinafine for the possibility of underlining tinea pedis that I could not control with ketoconazole. Looks somewhat better ooThe area on the plantar left first second toe web space is weeping with dried debris around the wound 12/01/17; left lateral leg wound continues to contract he Hydrofera Blue. It is becoming thinner in terms  of with nevertheless it is making good improvement. ooThe right first second toe web space looks less angry but still [name] large necrotic-looking wounds starting on the plantar aspect of the right foot extending between the toes and now extensively on the base of the right second toe. I gave him clindamycin and topical Bactroban for MRSA anterior benefiting for the possibility of underlying tinea pedis. Not looking better today ooThe area on the left first/second toe looks better. Debrided of necrotic debris 12/05/17* the patient was worked in urgently today because over the weekend he found blood on his incontinence bad when he woke up. He was found to have an ulcer by his wife who does most of his wound care. He came in today for [place] to look at this. He has not had [name] history of wounds in his buttocks in spite of his paraplegia. 12/08/17; seen in follow-up today at his usual appointment. He was seen earlier this week and found to have  [name] new wound on his buttock. We also follow him for wounds on the left lateral leg, left first second toe web space and right first second toe web space 12/15/17; we have been using Hydrofera Blue to the left lateral leg which has improved. The right first second toe web space has also improved. Left first second toe web space plantar aspect looks stable. The left buttock has worsened using Santyl. Apparently the buttock has drainage 12/22/17; we have been using Hydrofera Blue to the left lateral leg which continues to improve now 2 small wounds separated by normal skin. He tells [place] he had [name] fever up to 100 yesterday he is prone to UTIs but has not noted anything different. He does in and out catheterizations. The area between the first and second toes today does not look good necrotic surface covered with what looks to be purulent drainage and erythema extending into the third toe. I had gotten this to something that I thought look better last time however it is not look good today. He also has [name] necrotic surface over the buttock wound which is expanded. I thought there might be infection under here so I removed [name] lot of the surface with [name] #5 curet though nothing look like it really needed culturing. He is been using Santyl to this area 12/27/17; his original wound on the left lateral leg continues to improve using Hydrofera Blue. I gave him samples of Baxdella although he was unable to take them out of fear for an allergic reaction ["lump in his throat"].the culture I did of the purulent drainage from his second toe last week showed both enterococcus and [name] set Enterobacter I was also concerned about the erythema on the bottom of his foot although paradoxically although this looks somewhat better today. 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 We've been using silver alginate here. They came up today that he  sleeps in [name] recliner, I'm not sure why but I asked him to stop this 01/03/18; his original wound we've been using Hydrofera Blue is now separated into 2 areas. ooUlcer on his left buttock is better he is off the recliner and sleeping in bed ooFinally both wound areas between his first and second toes also looks some better 01/10/18; his original wound on the left lateral leg is now separated into 2 wounds we've been using Hydrofera Blue ooUlcer on his left buttock has some drainage.  There is [name] small probing site going into muscle layer superiorly.using silver alginate -He arrives today with [name] deep tissue injury on the left heel ooThe wound on the dorsal aspect of his first second toe on the left looks [name] lot betterusing silver alginate ketoconazole ooThe area on the first second toe web space on the right also looks [name] lot bette 01/17/18; his original wound on the left lateral leg continues to progress using Hydrofera Blue ooUlcer on his left buttock also is smaller surface healthier except for [name] small probing site going into the muscle layer superiorly. 2.4 cm of tunneling in this area ooDTI on his left heel we have only been offloading. Looks better than last week no threatened open no evidence of infection oothe wound on the dorsal aspect of the first second toe on the left continues to look like it's regressing we have only been using silver alginate and terbinafine orally ooThe area in the first second toe web space on the right also looks to be [name] lot better using silver alginate and terbinafine I think this was prompted by tinea pedis 01/31/18; the patient was hospitalized in [place] last week apparently for [name] complicated UTI. He was discharged on cefepime he does in and out catheterizations. In the hospital he was discovered M I don't mild elevation of AST and ALT and the terbinafine was stopped.predictably the pressure ulcer on s his buttock looks betterusing silver alginate. The area  on the left lateral leg also is better using Hydrofera Blue. The area between the first and second toes on the left better. First and second toes on the right still substantial but better. Finally the DTI on the left heel has held together and looks like it's resolving 02/07/18-he is here in follow-up evaluation for multiple ulcerations. He has new injury to the lateral aspect of the last issue [name] pressure ulcer, he states this is from adhesive removal trauma. He states he has tried multiple adhesive products with no success. All other ulcers appear stable. The left heel DTI is resolving. We will continue with same treatment plan and follow-up next week. 02/14/18; follow-up for multiple areas. ooHe has [name] new area last week on the lateral aspect of his pressure ulcer more over the posterior trochanter. The original pressure ulcer looks quite stable has healthy granulation. 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 Almost fully epithelialized on the original superior area using Hydrofera Blue ooDTI on the left heel has peeled off this week to reveal [name] small superficial wound under denuded skin and subcutaneous tissue ooBoth areas between the first and second toes look better including nothing open on the left 02/21/18; ooThe patient's wounds on his left ischial tuberosity and posterior left greater trochanter actually looked better. He has [name] large area of irritation around the area which I think is contact dermatitis. I am doubtful that this is fungal ooHis original wound on the left lateral calf continues to improve we have been using Hydrofera Blue ooThere is no open area in the left first second toe web space although there is [name] lot of thick callus ooThe DTI on the left heel required debridement today of necrotic surface eschar and subcutaneous tissue using silver alginate ooFinally the  area on the right first second toe webspace continues to contract using silver alginate and ketoconazole 02/28/18 ooLeft ischial tuberosity wounds look better using silver alginate. ooOriginal wound on  the left calf only has one small open area left using Hydrofera Blue ooDTI on the left heel required debridement mostly removing skin from around this wound surface. Using silver alginate ooThe areas on the right first/second toe web space using silver alginate and ketoconazole 03/08/18 on evaluation today patient appears to be doing decently well as best I can tell in regard to his wounds. This is the first time that I have seen him as he generally is followed by Dr. Dellia Nims. With that being said none of his wounds appear to be infected he does have an area where there is some skin covering what appears to be [name] new wound on the left dorsal surface of his great toe. This is right at the nail bed. With that being said I do believe that debrided away some of the excess skin can be of benefit in this regard. Otherwise he has been tolerating the dressing changes without complication. 03/14/18; patient arrives today with the multiplicity of wounds that we are following. He has not been systemically unwell ooOriginal wound on the left lateral calf now only has 2 small open areas we've been using Hydrofera Blue which should continue ooThe deep tissue injury on the left heel requires debridement today. We've been using silver alginate ooThe left first second toe and the right first second toe are both are reminiscence what I think was tinea pedis. Apparently some of the callus Surface between the toes was removed last week when it started draining. ooPurulent drainage coming from the wound on the ischial tuberosity on the left. 03/21/18-He is here in follow-up evaluation for multiple wounds. There is improvement, he is currently taking doxycycline, culture obtained last week grew tetracycline sensitive  MRSA. He tolerated debridement. The only change to last week's recommendations is to discontinue antifungal cream between toes. He will follow-up next week 03/28/18; following up for multiple wounds;Concern this week is streaking redness and swelling in the right foot. He is going to need antibiotics for this. 03/31/18; follow-up for right foot cellulitis. Streaking redness and swelling in the right foot on 03/28/18. 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 I put him on clindamycin 300 mg every 6 and brought him in for [name] quick check. He has an open wound between his first and second toes on the right foot as [name] potential source. 04/04/18; ooRight foot cellulitis is resolving he is completing clindamycin. This is truly good news ooLeft lateral calf wound which is initial wound only has one small open area inferiorly this is close to healing out. He has compression stockings. We will use Hydrofera Blue right down to the epithelialization of this ooNonviable surface on the left heel which was initially pressure with [name] DTI. We've been using Hydrofera Blue. I'm going to switch this back to silver alginate ooLeft first second toe/tinea pedis this looks better using silver alginate ooRight first second toe tinea pedis using silver alginate ooLarge pressure ulcers on theLeft ischial tuberosity. Small wound here Looks better. I am uncertain about the surface over the large wound. Using silver alginate 04/11/18; ooCellulitis in the right foot is resolved ooLeft lateral calf wound which was his original wounds still has 2 tiny open areas remaining this is just about closed ooNonviable surface on the left heel is better but still requires debridement ooLeft first second toe/tinea pedis still open using silver alginate ooRight first second toe wound tinea pedis I asked him to go back to using ketoconazole and silver alginate ooLarge  pressure ulcers on the left ischial tuberosity  this shear injury here is resolved. Wound is smaller. No evidence of infection using silver alginate 04/18/18; ooPatient arrives with an intense area of cellulitis in the right mid lower calf extending into the right heel area. Bright red and warm. Smaller area on the left anterior leg. He has [name] significant history of MRSA. He will definitely need antibioticsoodoxycycline ooHe now has 2 open areas on the left ischial tuberosity the original large wound and now [name] satellite area which I think was above his initial satellite areas. Not [name] wonderful surface on this satellite area surrounding erythema which looks like pressure related. ooHis left lateral calf wound again his original wound is just about closed ooLeft heel pressure injury still requiring debridement ooLeft first second toe looks [name] lot better using silver alginate ooRight first second toe also using silver alginate and ketoconazole cream also looks better 04/20/18; the patient was worked in early today out of concerns with his cellulitis on the right leg. I had started him on doxycycline. This was 2 days ago. His wife was concerned about the swelling in the area. Also concerned about the left buttock. He has not been systemically unwell no fever chills. No nausea vomiting or diarrhea 04/25/18; the patient's left buttock wound is continued to deteriorate he is using Hydrofera Blue. He is still completing clindamycin for the cellulitis on the right leg although all of this looks better. 05/02/18 ooLeft buttock wound still with [name] lot of drainage and [name] very tightly adherent fibrinous necrotic surface. He has [name] deeper area superiorly ooThe left lateral calf wound is still closed ooDTI wound on the left heel necrotic surface especially the circumference using Iodoflex ooAreas between his left first second toe and right first second toe both look better. Dorsally and the right first second toe he had [name] necrotic surface although  at smaller. In using silver alginate and ketoconazole. I did [name] culture last week which was [name] deep tissue culture of the reminiscence of the open wound on the right first second toe dorsally. This grew [name] few Acinetobacter and [name] few methicillin-resistant staph aureus. Nevertheless the area actually this week looked better. I didn't feel the need to specifically address this at least in terms of systemic antibiotics. 05/09/18; wounds are measuring larger more drainage per our intake. We are using Santyl covered with alginate on the large superficial buttock wounds, Iodosorb on the left heel, ketoconazole and silver alginate to the dorsal first and second toes bilaterally. 05/16/18; ooThe area on his left buttock better in some aspects although the area superiorly over the ischial tuberosity required an extensive debridement.using Santyl ooLeft heel appears stable. Using Iodoflex ooThe areas between his first and second toes are not bad however there is spreading erythema up the dorsal aspect of his left foot this looks like cellulitis again. He is insensate the erythema is really very brilliant.o Erysipelas He went to see an allergist days ago because he was itching part of this he had lab work done. This showed [name] white count of 15.1 with 70% neutrophils. Hemoglobin of 11.4 and [name] platelet count of 659,000. Last white count we had in [facility] was [name] 2-1/2 years ago which was 25.9 but he was ill at the time. He was able to show me some lab work that was done by his primary physician the pattern is about the same. 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  But prompted me to go ahead and do x-rays of both feet and the pelvis rule out osteomyelitis. He also had [name] comprehensive metabolic panel this was reasonably normal his albumin was 3.7 liver function tests BUN/creatinine all normal 05/23/18; x-rays of both his feet from last week were negative for underlying  pulmonary abnormality. The x-ray of his pelvis however showed mild irregularity in the left ischial which may represent some early osteomyelitis. The wound in the left ischial continues to get deeper clearly now exposed muscle. Each week necrotic surface material over this area. Whereas the rest of the wounds do not look so bad. ooThe left ischial wound we have been using Santyl and calcium alginate ooT the left heel surface necrotic debris using Iodoflex o ooThe left lateral leg is still healed ooAreas on the left dorsal foot and the right dorsal foot are about the same. There is some inflammation on the left which might represent contact dermatitis, fungal dermatitis I am doubtful cellulitis although this looks better than last week 05/30/18; CT scan done at Hospital did not show any osteomyelitis or abscess. Suggested the possibility of underlying cellulitis although I don't see [name] lot of evidence of this at the bedside ooThe wound itself on the left buttock/upper thigh actually looks somewhat better. No debridement ooLeft heel also looks better no debridement continue Iodoflex ooBoth dorsal first second toe spaces appear better using Lotrisone. Left still required debridement 06/06/18; ooIntake reported some purulent looking drainage from the left gluteal wound. Using Santyl and calcium alginate ooLeft heel looks better although still [name] nonviable surface requiring debridement ooThe left dorsal foot first/second webspace actually expanding and somewhat deeper. I may consider doing [name] shave biopsy of this area ooRight dorsal foot first/second webspace appears stable to improved. Using Lotrisone and silver alginate to both these areas 06/13/18 ooLeft gluteal surface looks better. Now separated in the 2 wounds. No debridement required. Still drainage. We'll continue silver alginate ooLeft heel continues to look better with Iodoflex continue this for at least another week ooOf his dorsal  foot wounds the area on the left still has some depth although it looks better than last week. We've been using Lotrisone and silver alginate 06/20/18 ooLeft gluteal continues to look better healthy tissue ooLeft heel continues to look better healthy granulation wound is smaller. He is using Iodoflex and his long as this continues continue the Iodoflex ooDorsal right foot looks better unfortunately dorsal left foot does not. There is swelling and erythema of his forefoot. He had minor trauma to this several days ago but doesn't think this was enough to have caused any tissue injury. Foot looks like cellulitis, we have had this problem before 06/27/18 on evaluation today patient appears to be doing [name] little worse in regard to his foot ulcer. 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 Both of which would really be his only options for treating this infection. In the past he has been given and effusion of Orbactiv. This is done very well for him in the past again it's one time dosing IV antibiotic therapy. Subsequently I do believe this is something we're gonna need to see about doing at this point in time. Currently his other wounds seem to be doing somewhat better in my pinion I'm pretty happy in that regard. 07/03/18 on evaluation today patient's wounds actually appear to be doing  fairly well. He has been tolerating the dressing changes without complication. All in all he seems to be showing signs of improvement. In regard to the antibiotics he has been dealing with infectious disease since I saw him last week as far as getting this scheduled. In the end [facility]'s [facility] this coming Friday. In the meantime he has been continuing to perform the dressing changes in such as previous. There does not appear to  be any evidence of infection worsengin at this time. 07/10/18; ooSince I last saw this man 2 [name] ago things have actually improved. IV antibiotics of resulted in less forefoot erythema although there is still some present. He is not systemically unwell ooLeft buttock wounds o2 now have no depth there is increased epithelialization Using silver alginate ooLeft heel still requires debridement using Iodoflex ooLeft dorsal foot still with [name] sizable wound about the size of [name] border but healthy granulation ooRight dorsal foot still with [name] slitlike area using silver alginate 07/18/18; the patient's cellulitis in the left foot is improved in fact I think it is on its way to resolving. ooLeft buttock wounds o2 both look better although the larger one has hypertension granulation we've been using silver alginate ooLeft heel has some thick circumferential redundant skin over the wound edge which will need to be removed today we've been using Iodoflex ooLeft dorsal foot is still [name] sizable wound required debridement using silver alginate ooThe right dorsal foot is just about closed only [name] small open area remains here 07/25/18; left foot cellulitis is resolved ooLeft buttock wounds o2 both look better. Hyper-granulation on the major area ooLeft heel as some debris over the surface but otherwise looks [name] healthier wound. Using silver collagen ooRight dorsal foot is just about closed 07/31/18; arrives with our intake nurse worried about purulent drainage from the buttock. We had hyper-granulation here last week ooHis buttock wounds o2 continue to look better ooLeft heel some debris over the surface but measuring smaller. ooRight dorsal foot unfortunately has openings between the toes ooLeft foot superficial wound looks less aggravated. 08/07/18 ooButtock wounds continue to look better although some of her granulation and the larger medial wound. silver alginate ooLeft heel continues to look [name]  lot better.silver collagen ooLeft foot superficial wound looks less stable. Requires debridement. He has [name] new wound superficial area on the foot on the lateral dorsal foot. ooRight foot looks better using silver alginate without Lotrisone 08/14/2018; patient was in the ER last week diagnosed with [name] UTI. He is now on Cefpodoxime and Macrodantin. ooButtock wounds continued to be smaller. Using silver alginate ooLeft heel continues to look better using silver collagen ooLeft foot superficial wound looks as though it is improving ooRight dorsal foot area is just about healed. 08/21/2018; patient is completed his antibiotics for his UTI. ooHe has 2 open areas on the buttocks. There is still not closed although the surface looks satisfactory. Using silver alginate ooLeft heel continues to improve using silver collagen ooThe bilateral dorsal foot areas which are at the base of his first and second toes/possible tinea pedis are actually stable on the left but worse on the right. The area on the left required debridement of necrotic surface. After debridement I obtained [name] specimen for PCR culture. ooThe right dorsal foot which is been just about healed last week is now reopened 08/28/2018; culture done on the left dorsal foot showed coag negative staph both staph epidermidis  and Lugdunensis. I think this is worthwhile initiating systemic treatment. I will use doxycycline given his long list of allergies. The area on the left heel slightly improved but still requiring debridement. ooThe large wound on the buttock is just about closed whereas the smaller one is larger. Using silver alginate in this area 09/04/2018; patient is completing his doxycycline for the left foot although this continues to be [name] very difficult wound area with very adherent necrotic debris. We are using silver alginate to all his wounds right foot left foot and the small wounds on his buttock, silver collagen on the left  heel. 09/11/2018; once again this patient has intense erythema and swelling of the left forefoot. Lesser degrees of erythema in the right foot. He has [name] long list of allergies and intolerances. I will reinstitute doxycycline. 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 Using silver alginate ooLeft heel also looks better using silver collagen ooUnfortunately both the areas on his feet look worse. The area on the left first second webspace is now gone through to the plantar part of his foot. The area on the left foot anteriorly is irritated with erythema and swelling in the forefoot. 09/25/2018 ooHis wound on the left plantar heel looks better. Using silver collagen ooThe area on the left buttock 2 small remnant areas. One is closed one is still open. Using silver alginate ooThe areas between both his first and second toes look worse. This in spite of long-standing antifungal therapy with ketoconazole and silver alginate which should have antifungal activity ooHe has small areas around his original wound on the left calf one is on the bottom of the original scar tissue and one superiorly both of these are small and superficial but again given wound history in this site this is worrisome 10/02/2018 ooLeft plantar heel continues to gradually contract using silver collagen ooLeft buttock wound is unchanged using silver alginate ooThe areas on his dorsal feet between his first and second toes bilaterally look about the same. 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 Small open area here. We have been using silver alginate 10/09/2018; ooLeft plantar heel continues to look healthy and contract. No debridement is required ooLeft buttock slightly smaller with [name] tape  injury wound just below which was new this week ooDorsal feet somewhat improved I have been using clindamycin ooLeft lateral looks lower extremity the actual open area looks worse although [name] lot of this is epithelialized. I am going to change to silver collagen today He has [name] lot more swelling in the right leg although this is not pitting not red and not particularly warm there is [name] lot of spasm in the right leg usually indicative of people with paralysis of some underlying discomfort. We have reviewed his vascular status from 2017 he had [name] left greater saphenous vein ablation. I wonder about referring him back to vascular surgery if the area on the left leg continues to deteriorate. 10/16/2018 in today for follow-up and management of multiple lower extremity ulcers. His left Buttock wound is much lower smaller and almost closed completely. The wound to the left ankle has began to reopen with Epithelialization and some adherent slough. He has multiple new areas to the left foot and leg. The left dorsal foot without much improvement.  Wound present between left great webspace and 2nd toe. Erythema and edema present right leg. Right LE ultrasound obtained on 10/10/18 was negative for DVT . 10/23/2018; ooLeft buttock is closed over. Still dry macerated skin but there is no open wound. I suspect this is chronic pressure/moisture ooLeft lateral calf is quite [name] bit worse than when I saw this last. There is clearly drainage here he has macerated skin into the left plantar heel. We will change the primary dressing to alginate ooLeft dorsal foot has some improvement in overall wound area. Still using clindamycin and silver alginate ooRight dorsal foot about the same as the left using clindamycin and silver alginate ooThe erythema in the right leg has resolved. He is DVT rule out was negative ooLeft heel pressure area required debridement although the wound is smaller and the surface is  health 10/26/2018 ooThe patient came back in for his nurse check today predominantly because of the drainage coming out of the left lateral leg with [name] recent reopening of his original wound on the left lateral calf. He comes in today with [name] large amount of surrounding erythema around the wound extending from the calf into the ankle and even in the area on the dorsal foot. He is not systemically unwell. He is not febrile. Nevertheless this looks like cellulitis. We have been using silver alginate to the area. I changed him to [name] regular visit and I am going to prescribe him doxycycline. The rationale here is [name] long list of medication intolerances and [name] history of MRSA. I did not see anything that I thought would provide [name] valuable culture 10/30/2018 ooFollow-up from his appointment 4 days ago with really an extensive area of cellulitis in the left calf left lateral ankle and left dorsal foot. I put him on doxycycline. He has [name] long list of medication allergies which are true allergy reactions. 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 In any case he is [name] lot better today. The erythema especially in the anterior and lateral left calf is better. He still has left ankle erythema. He also is complaining about increasing edema in the right leg we have only been using Kerlix Coban and he has been doing the wraps at home. Finally he has [name] spotty rash on the medial part of his upper left calf which looks like folliculitis or perhaps wrap occlusion type injury. Small superficial macules not pustules 11/06/18 patient arrives today with again [name] considerable degree of erythema around the wound on the left lateral calf extending into the dorsal ankle and dorsal foot. This is [name] lot worse than when I saw this last week. He is on doxycycline really with not [name] lot of improvement. He has not been systemically unwell Wounds on the; left heel actually  looks improved. Original area on the left foot and proximity to the first and second toes looks about the same. He has superficial areas on the dorsal foot, anterior calf and then the reopening of his original wound on the left lateral calf which looks about the same ooThe only area he has on the right is the dorsal webspace first and second which is smaller. ooHe has [name] large area of dry erythematous skin on the left buttock small open area here. 11/13/2018; the patient arrives in much better condition. The erythema around the wound on the left lateral calf is [name] lot better. Not sure whether this was the clindamycin or  the TCA and ketoconazole or just in the improvement in edema control [stasis dermatitis]. In any case this is [name] lot better. The area on the left heel is very small and just about resolved using silver collagen we have been using silver alginate to the areas on his dorsal feet 11/20/2018; his wounds include the left lateral calf, left heel, dorsal aspects of both feet just proximal to the first second webspace. He is stable to slightly improved. I did not think any changes to his dressings were going to be necessary 11/27/2018 he has [name] reopening on the left buttock which is surrounded by what looks like tinea or perhaps some other form of dermatitis. The area on the left dorsal foot has some erythema around it I have marked this area but I am not sure whether this is cellulitis or not. Left heel is not closed. Left calf the reopening is really slightly longer and probably worse 1/13; in general things look better and smaller except for the left dorsal foot. Area on the left heel is just about closed, left buttock looks better only [name] small wound remains in the skin looks better [using Lotrisone] 1/20; the area on the left heel only has [name] few remaining open areas here. Left lateral calf about the same in terms of size, left dorsal foot slightly larger right lateral foot still not closed.  The area on the left buttock has no open wound and the surrounding skin looks [name] lot better 1/27; the area on the left heel is closed. Left lateral calf better but still requiring extensive debridements. The area on his left buttock is closed. He still has the open areas on the left dorsal foot which is slightly smaller in the right foot which is slightly expanded. We have been using Iodoflex on these areas as well 2/3; left heel is closed. Left lateral calf still requiring debridement using Iodoflex there is no open area on his left buttock however he has dry scaly skin over [name] large area of this. Not really responding well to the Lotrisone. Finally the areas on his dorsal feet at the level of the first second webspace are slightly smaller on the right and about the same on the left. Both of these vigorously debrided with Anasept and gauze 2/10; left heel remains closed he has dry erythematous skin over the left buttock but there is no open wound here. Left lateral leg has come in and with. Still requiring debridement we have been using Iodoflex here. Finally the area on the left dorsal foot and right dorsal foot are really about the same extremely dry callused fissured areas. He does not yet have [name] dermatology appointment 2/17; left heel remains closed. He has [name] new open area on the left buttock. The area on the left lateral calf is bigger longer and still covered in necrotic debris. No major change in his foot areas bilaterally. I am awaiting for [name] dermatologist to look on this. We have been using ketoconazole I do not know that this is been doing any good at all. 2/24; left heel remains closed. The left buttock wound that was new reopening last week looks better. The left lateral calf appears better also although still requires debridement. The major area on his foot is the left first second also requiring debridement. We have been putting Prisma on all wounds. I do not believe that the ketoconazole  has done too much good for his feet. He will use Lotrisone I am going  to give him [name] 2-week course of terbinafine. We still do not have [name] dermatology appointment 3/2 left heel remains closed however there is skin over bone in this area I pointed this out to him today. The left buttock wound is epithelialized but still does not look completely stable. The area on the left leg required debridement were using silver collagen here. With regards to his feet we changed to Lotrisone last week and silver alginate. 3/9; left heel remains closed. Left buttock remains closed. The area on the right foot is essentially closed. The left foot remains unchanged. Slightly smaller on the left lateral calf. Using silver collagen to both of these areas 3/16-Left heel remains closed. Area on right foot is closed. Left lateral calf above the lateral malleolus open wound requiring debridement with easy bleeding. Left dorsal wound proximal to first toe also debrided. Left ischial area open new. Patient has been using Prisma with wrapping every 3 days. Dermatology appointment is apparently tomorrow.Patient has completed his terbinafine 2-week course with some apparent improvement according to him, there is still flaking and dry skin in his foot on the left 3/23; area on the right foot is reopened. The area on the left anterior foot is about the same still [name] very necrotic adherent surface. He still has the area on the left leg and reopening is on the left buttock. He apparently saw dermatology although I do not have [name] note. According to the patient who is usually fairly well informed they did not have any good ideas. Put him on oral terbinafine which she is been on before. 3/30; using silver collagen to all wounds. Apparently his dermatologist put him on doxycycline and rifampin presumably some culture grew staph. I do not have this result. He remains on terbinafine although I have used terbinafine on him before 4/6; patient  has had [name] fairly substantial reopening on the right foot between the first and second toes. He is finished his terbinafine and I believe is on doxycycline and rifampin still as prescribed by dermatology. We have been using silver collagen to all his wounds although the patient reports that he thinks silver alginate does better on the wounds on his buttock. 4/13; the area on his left lateral calf about the same size but it did not require debridement. ooLeft dorsal foot just proximal to the webspace between the first and second toes is about the same. Still nonviable surface. I note some superficial bronze discoloration of the dorsal part of his foot ooRight dorsal foot just proximal to the first and second toes also looks about the same. I still think there may be the same discoloration I noted above on the left ooLeft buttock wound looks about the same 4/20; left lateral calf appears to be gradually contracting using silver collagen. 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 The left dorsal foot wound is debrided of tightly adherent necrotic debris and really cleans up quite nicely. The right area is worse with expansion. I did not debride this it is now over the base of the second toe ooThe area on his left buttock is smaller no debridement is required using silver collagen 5/4; left calf continues to make good progress. ooHe arrives with erythema around the wounds on his dorsal foot which even extends to the plantar aspect. Very concerning for coexistent infection. He is finished the erythromycin I gave him for possible erythrasma this does not seem to have helped. ooThe area on the  left foot is about the same base of the dorsal toes ooIs area on the buttock looks improved on the left 5/11; left calf and left buttock continued to make good progress. Left foot is about the same to slightly improved. ooMajor problem is on the  right foot. He has not had an x-ray. Deep tissue culture I did last week showed both Enterobacter and E. coli. I did not change the doxycycline I put him on empirically although neither 1 of these were plated to doxycycline. He arrives today with the erythema looking worse on both the dorsal and plantar foot. Macerated skin on the bottom of the foot. he has not been systemically unwell 5/18-Patient returns at 1 week, left calf wound appears to be making some progress, left buttock wound appears slightly worse than last time, left foot wound looks slightly better, right foot redness is marginally better. X-ray of both feet show no air or evidence of osteomyelitis. Patient is finished his Omnicef and terbinafine. He continues to have macerated skin on the bottom of the left foot as well as right 5/26; left calf wound is better, left buttock wound appears to have multiple small superficial open areas with surrounding macerated skin. X-rays that I did last time showed no evidence of osteomyelitis in either foot. He is finished cefdinir and doxycycline. I do not think that he was on terbinafine. He continues to have [name] large superficial open area on the right foot anterior dorsal and slightly between the first and second toes. I did send him to dermatology 2 months ago or so wondering about whether they would do [name] fungal scraping. I do not believe they did but did do [name] culture. We have been using silver alginate to the toe areas, he has been using antifungals at home topically either ketoconazole or Lotrisone. We are using silver collagen on the left foot, silver alginate on the right, silver collagen on the left lateral leg and silver alginate on the left buttock 6/1; left buttock area is healed. We have the left dorsal foot, left lateral leg and right dorsal foot. We are using silver alginate to the areas on both feet and silver collagen to the area on his left lateral calf 6/8; the left buttock apparently  reopened late last week. He is not really sure how this happened. He is tolerating the terbinafine. Using silver alginate to all wounds 6/15; left buttock wound is larger than last week but still superficial. ooCame in the clinic today with [name] report of purulence from the left lateral leg I did not identify any infection ooBoth areas on his dorsal feet appear to be better. He is tolerating the terbinafine. Using silver alginate to all wounds 6/22; left buttock is about the same this week, left calf quite [name] bit better. His left foot is about the same however he comes in with erythema and warmth in the right forefoot once again. Culture that I gave him in the beginning of May showed Enterobacter and E. coli. I gave him doxycycline and things seem to improve although neither 1 of these organisms was specifically plated. 6/29; left buttock is larger and dry this week. Left lateral calf looks to me to be improved. Left dorsal foot also somewhat improved right foot completely unchanged. The erythema on the right foot is still present. He is completing the Ceftin dinner that I gave him empirically [see discussion above.) 7/6 - All wounds look to be stable and perhaps improved, the left buttock wound  is slightly smaller, per patient bleeds easily, completed ceftin, the right foot redness is less, he is on terbinafine 7/13; left buttock wound about the same perhaps slightly narrower. Area on the left lateral leg continues to narrow. Left dorsal foot slightly smaller right foot about the same. We are using silver alginate on the right foot and Hydrofera Blue to the areas on the left. Unna boot on the left 2 layer compression on the right 7/20; left buttock wound absolutely the same. Area on lateral leg continues to get better. Left dorsal foot require debridement as did the right no major change in the 7/27; left buttock wound the same size necrotic debris over the surface. The area on the lateral leg is  closed once again. His left foot looks better right foot about the same although there is some involvement now of the posterior first second toe area. He is still on terbinafine which I have given him for [name] month, not certain [name] centimeter major change 06/25/19-All wounds appear to be slightly improved according to report, left buttock wound looks clean, both foot wounds have minimal to no debris the right dorsal foot has minimal slough. We are using Hydrofera Blue to the left and silver alginate to the right foot and ischial wound. 8/10-Wounds all appear to be around the same, the right forefoot distal part has some redness which was not there before, however the wound looks clean and small. Ischial wound looks about the same with no changes 8/17; his wound on the left lateral calf which was his original chronic venous insufficiency wound remains closed. Since I last saw him the areas on the left dorsal foot right dorsal foot generally appear better but require debridement. The area on his left initial tuberosity appears somewhat larger to me perhaps hyper granulated and bleeds very easily. We have been using Hydrofera Blue to the left dorsal foot and silver alginate to everything else 8/24; left lateral calf remains closed. The areas on his dorsal feet on the webspace of the first and second toes bilaterally both look better. The area on the left buttock which is the pressure ulcer stage II slightly smaller. I change the dressing to Hydrofera Blue to all areas 8/31; left lateral calf remains closed. The area on his dorsal feet bilaterally look better. Using Hydrofera Blue. Still requiring debridement on the left foot. No change in the left buttock pressure ulcers however 9/14; left lateral calf remains closed. Dorsal feet look quite [name] bit better than 2 [name] ago. Flaking dry skin also [name] lot better with the ammonium lactate I gave him 2 [name] ago. The area on the left buttock is improved. He states that  his Roho cushion developed [name] leak and he is getting [name] new one, in the interim he is offloading this vigorously 9/21; left calf remains closed. Left heel which was [name] possible DTI looks better this week. He had macerated tissue around the left dorsal foot right foot looks satisfactory and improved left buttock wound. I changed his dressings to his feet to silver alginate bilaterally. Continuing Hydrofera Blue on the left buttock. 9/28 left calf remains closed. Left heel did not develop anything [possible DTI] dry flaking skin on the left dorsal foot. Right foot looks satisfactory. Improved left buttock wound. We are using silver alginate on his feet Hydrofera Blue on the buttock. I have asked him to go back to the Lotrisone on his feet including the wounds and surrounding areas 10/5; left calf remains closed.  The areas on the left and right feet about the same. [name] lot of this is epithelialized however debris over the remaining open areas. He is using Lotrisone and silver alginate. The area on the left buttock using Hydrofera Blue 10/26. Patient has been out for 3 [name] secondary to Covid concerns. He tested negative but I think his wife tested positive. He comes in today with the left foot substantially worse, right foot about the same. Even more concerning he states that the area on his left buttock closed over but then reopened and is considerably deeper in one aspect than it was before [stage III wound] 11/2; left foot really about the same as last week. Quarter sized wound on the dorsal foot just proximal to the first second toes. Surrounding erythema with areas of denuded epithelium. This is not really much different looking. Did not look like cellulitis this time however. ooRight foot area about the same.. We have been using silver alginate alginate on his toes ooLeft buttock still substantial irritated skin around the wound which I think looks somewhat better. We have been using Hydrofera Blue  here. 11/9; left foot larger than last week and [name] very necrotic surface. Right foot I think is about the same perhaps slightly smaller. Debris around the circumference also addressed. Unfortunately on the left buttock there is been [name] decline. Satellite lesions below the major wound distally and now [name] an additional one posteriorly we have been using Hydrofera Blue but I think this is [name] pressure issue 11/16; left foot ulcer dorsally again [name] very adherent necrotic surface. Right foot is about the same. Not much change in the pressure ulcer on his left buttock. 11/30; left foot ulcer dorsally basically the same as when I saw him 2 [name] ago. Very adherent fibrinous debris on the wound surface. Patient reports [name] lot of drainage as well. 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 We have been using Iodoflex, patient changed back to alginate because of the drainage. Area on his right dorsal foot really looks benign with [name] healthier surface certainly [name] lot better than on the left. Left buttock wounds all improved using Hydrofera Blue 12/7; left dorsal foot again no improvement. Tightly adherent debris. PCR culture I did last week only showed likely skin contaminant. I have gone ahead and done [name] punch biopsy of this which is about the last thing in terms of investigations I can think to do. He has known venous insufficiency and venous hypertension and this could be the issue here. The area on the right foot is about the same left buttock slightly worse according to our intake nurse secondary to [facility] Blue sticking to the wound 12/14; biopsy of the left foot that I did last time showed changes that could be related to wound healing/chronic stasis dermatitis phenomenon no neoplasm. We have been using silver alginate to both feet. I change the one on the left today to Sorbact and silver alginate to his other 2 wounds 12/28; the patient arrives with the  following problems; ooMajor issue is the dorsal left foot which continues to be [name] larger deeper wound area. Still with [name] completely nonviable surface ooParadoxically the area mirror image on the right on the right dorsal foot appears to be getting better. ooHe had some loss of dry denuded skin from the lower part of his original wound on the left lateral calf. Some of this area looked [name] little vulnerable and for this reason  we put him in wrap that on this side this week ooThe area on his left buttock is larger. He still has the erythematous circular area which I think is [name] combination of pressure, sweat. This does not look like cellulitis or fungal dermatitis 11/26/2019; -Dorsal left foot large open wound with depth. Still debris over the surface. Using Sorbact ooThe area on the dorsal right foot paradoxically has closed over [facility] has [name] reopening on the left ankle laterally at the base of his original wound that extended up into the calf. This appears clean. ooThe left buttock wound is smaller but with very adherent necrotic debris over the surface. We have been using silver alginate here as well The patient had arterial studies done in 2017. He had biphasic waveforms at the dorsalis pedis and posterior tibial bilaterally. ABI in the left was 1.17. Digit waveforms were dampened. He has slight spasticity in the great toes I do not think [name] TBI would be possible 1/11; the patient comes in today with [name] sizable reopening between the first and second toes on the right. This is not exactly in the same location where we have been treating wounds previously. According to our intake nurse this was actually fairly deep but 0.6 cm. The area on the left dorsal foot looks about the same the surface is somewhat cleaner using Sorbact, his MRI is in 2 days. We have not managed yet to get arterial studies. The new reopening on the left lateral calf looks somewhat better using alginate. The left buttock wound is  about the same using alginate 1/18; the patient had his ARTERIAL studies which were quite normal. ABI in the right at 1.13 with triphasic/biphasic waveforms on the left ABI 1.06 again with triphasic/biphasic waveforms. It would not have been possible to have done [name] toe brachial index because of spasticity. We have been using Sorbac to the left foot alginate to the rest of his wounds on the right foot left lateral calf and left buttock 1/25; arrives in clinic with erythema and swelling of the left forefoot worse over the first MTP area. This extends laterally dorsally and but also posteriorly. Still has an area on the left lateral part of the lower part of his calf wound it is eschared and clearly not closed. ooArea on the left buttock still with surrounding irritation and erythema. ooRight foot surface wound dorsally. The area between the right and first and second toes appears better. 2/1; ooThe left foot wound is about the same. Erythema slightly better I gave him [name] week of doxycycline empirically ooRight foot wound is more extensive extending between the toes to the plantar surface ooLeft lateral calf really no open surface on the inferior part of his original wound however the entire area still looks vulnerable ooAbsolutely no improvement in the left buttock wound required debridement. 2/8; the left foot is about the same. Erythema is slightly improved I gave him clindamycin last week. ooRight foot looks better he is using Lotrimin and silver alginate ooHe has [name] breakdown in the left lateral calf. Denuded epithelium which I have removed ooLeft buttock about the same were using Hydrofera Blue 2/15; left foot is about the same there is less surrounding erythema. Surface still has tightly adherent debris which I have debriding however not making any progress ooRight foot has [name] substantial wound on the medial right second toe between the first and second webspace. ooStill an open area  on the left lateral calf distal area. ooButtock wound is about  the same 2/22; left foot is about the same less surrounding erythema. Surface has adherent debris. Polymen Ag Right foot area significant wound between the first and second toes. We have been using silver alginate here Left lateral leg polymen Ag at the base of his original venous insufficiency wound ooLeft buttock some improvement here 3/1; ooRight foot is deteriorating in the first second toe webspace. Larger and more substantial. We have been using silver alginate. ooLeft dorsal foot about the same markedly adherent surface debris using PolyMem Ag ooLeft lateral calf surface debris using PolyMem AG ooLeft buttock is improved again using PolyMem Ag. ooHe is completing his terbinafine. The erythema in the foot seems better. He has been on this for 2 [name] 3/8; no improvement in any wound area in fact he has [name] small open area on the dorsal midfoot which is new this week. He has not gotten his foot x-rays yet 3/15; his x-rays were both negative for osteomyelitis of both feet. No major change in any of his wounds on the extremities however his buttock wounds are better. We have been using polymen on the buttocks, left lower leg. Iodoflex on the left foot and silver alginate on the right 3/22; arrives in clinic today with the 2 major issues are the improvement in the left dorsal foot wound which for once actually looks healthy with [name] nice healthy wound surface without debridement. Using Iodoflex here. Unfortunately on the left lateral calf which is in the distal part of his original wound he came to the clinic here for there was purulent drainage noted some increased breakdown scattered around the original area and [name] small area proximally. We we are using polymen here will change to silver alginate today. His buttock wound on the left is better and I think the area on the right first second toe webspace is also improved 3/29; left  dorsal foot looks better. Using Iodoflex. Left ankle culture from deterioration last time grew E. coli, Enterobacter and Enterococcus. I will give him [name] course of cefdinir although that will not cover Enterococcus. The area on the right foot in the webspace of the first and second toe lateral first toe looks better. The area on his buttock is about healed Vascular appointment is on April 21. This is to look at his venous system vis--vis continued breakdown of the wounds on the left including the left lateral leg and left dorsal foot he. He has had previous ablations on this side 4/5; the area between the right first and second toes lateral aspect of the first toe looks better. Dorsal aspect of the left first toe on the left foot also improved. Unfortunately the left lateral lower leg is larger and there is [name] second satellite wound superiorly. The usual superficial abrasions on the left buttock overall better but certainly not closed 4/12; the area between the right first and second toes is improved. Dorsal aspect of the left foot also slightly smaller with [name] vibrant healthy looking surface. No real change in the left lateral leg and the left buttock wound is healed He has an unaffordable co-pay for Apligraf. Appointment with vein and vascular with regards to the left leg venous part of the circulation is on 4/21 4/19; we continue to see improvement in all wound areas. Although this is minor. He has his vascular appointment on 4/21. The area on the left buttock has not reopened although right in the center of this area the skin looks somewhat threatened 4/26; the left buttock  is unfortunately reopened. In general his left dorsal foot has [name] healthy surface and looks somewhat smaller although it was not measured as such. The area between his first and second toe webspace on the right as [name] small wound against the first toe. The patient saw vascular surgery. The real question I was asking was about the  small saphenous vein on the left. He has previously ablated left greater saphenous vein. Nothing further was commented on on the left. Right greater saphenous vein without reflux at the saphenofemoral junction or proximal thigh there was no indication for ablation of the right greater saphenous vein duplex was negative for DVT bilaterally. They did not think there was anything from [name] point of view that could be offered. They ABIs within normal limits 5/3; only small open area on the left buttock. The area on the left lateral leg which was his original venous reflux is now 2 wounds both which look clean. We are using Iodoflex on the left dorsal foot which looks healthy and smaller. He is down to [name] very tiny area between the right first and second toes, using silver alginate 5/10; all of his wounds appear better. We have much better edema control in 4 layer compression on the left. This may be the factor that is allowing the left foot and left lateral calf to heal. He has external compression garments at home 04/14/20-All of his wounds are progressing well, the left forefoot is practically closed, left ischium appears to be about the same, right toe webspace is also smaller. The left lateral leg is about the same, continue using Hydrofera Blue to this, silver alginate to the ischium, Iodoflex to the toe space on the right 6/7; most of his wounds outside of the left buttock are doing well. The area on the left lateral calf and left dorsal foot are smaller. The area on the right foot in between the first and second toe webspace is barely visible although he still says there is some drainage here is the only reason I did not heal this out. ooUnfortunately the area on the left buttock almost looks like he has [name] skin tear from tape. He has open wound and then [name] large flap of skin that we are trying to get adherence over an area just next to the remaining wound 6/21; 2 week follow-up. I  believe is been here for nurse visits. Miraculously the area between his first and second toes on the left dorsal foot is closed over. Still open on the right first second web space. The left lateral calf has 2 open areas. Distally this is more superficial. The proximal area had [name] little more depth and required debridement of adherent necrotic material. His buttock wound is actually larger we have been using silver alginate here 6/28; the patient's area on the left foot remains closed. Still open wet area between the first and second toes on the right and also extending into the plantar aspect. We have been using silver alginate in this location. He has 2 areas on the left lower leg part of his original long wounds which I think are better. We have been using Hydrofera Blue here. Hydrofera Blue to the left buttock which is stable 7/12; left foot remains closed. Left ankle is closed. May be [name] small area between his right first and second toes the only truly open area is on the left buttock. We have been using Hydrofera Blue here 7/19; patient arrives with marked deterioration  especially in the left foot and ankle. We did not put him in [name] compression wrap on the left last week in fact he wore his juxta lite stockings on either side although he does not have an underlying stocking. He has [name] reopening on the left dorsal foot, left lateral ankle and [name] new area on the right dorsal ankle. More worrisome is the degree of erythema on the left foot extending on the lateral foot into the lateral lower leg on the left 7/26; the patient had erythema and drainage from the lateral left ankle last week. Culture of this grew MRSA resistant to doxycycline and clindamycin which are the 2 antibiotics we usually use with this patient who has multiple antibiotic allergies including linezolid, trimethoprim sulfamethoxazole. I had give him an empiric doxycycline and he comes in the area certainly looks somewhat better although  it is blotchy in his lower leg. He has not been systemically unwell. He has had areas on the left dorsal foot which is [name] reopening, chronic wounds on the left lateral ankle. Both of these I think are secondary to chronic venous insufficiency. The area between his first and second toes is closed as far as I can tell. He had [name] new wrap injury on the right dorsal ankle last week. Finally he has an area on the left buttock. We have been using silver alginate to everything except the left buttock we are using Hydrofera Blue 06/30/20-Patient returns at 1 week, has been given [name] sample dose pack of NUZYRA which is [name] tetracycline derivative [omadacycline], patient has completed those, we have been using silver alginate to almost all the wounds except the left ischium where we are using Hydrofera Blue all of them look better 8/16; since I last saw the patient he has been doing well. The area on the left buttock, left lateral ankle and left foot are all closed today. He has completed the [place] I gave him last time and tolerated this well. He still has open areas on the right dorsal ankle and in the right first second toe area which we are using silver alginate. 8/23; we put him in his bilateral external compression stockings last week as he did not have anything open on either leg except for concerning area between the right first and second toe. He comes in today with an area on the left dorsal foot slightly more proximal than the original wound, the left lateral foot but this is actually [name] continuation of the area he had on the left lateral ankle from last time. As well he is opened up on the left buttock again. 8/30; comes in today with things looking [name] lot better. The area on the left lower ankle has closed down as has the left foot but with eschar in both areas. The area on the dorsal right ankle is also epithelialized. Very little remaining of the left buttock wound. We have been using silver alginate on all  wound areas 9/13; the area in the first second toe webspace on the right has fully epithelialized. He still has some vulnerable epithelium on the right and the ankle and the dorsal foot. He notes weeping. He is using his juxta lite stocking. On the left again the left dorsal foot is closed left lateral ankle is closed. We went to the juxta lite stocking here as well. ooStill vulnerable in the left buttock although only 2 small open areas remain here 9/27; 2-week follow-up. We did not look at his left leg  but the patient says everything is closed. He is [name] bit disturbed by the amount of edema in his left foot he is using juxta lite stockings but asking about over the toes stockings which would be 30/40, will talk to him next time. According to him there is no open wound on either the left foot or the left ankle/calf He has an open area on the dorsal right calf which I initially point [name] wrap injury. He has superficial remaining wound on the left ischial tuberosity been using silver alginate although he says this sticks to the wound 10/5; we gave him 2-week follow-up but he called yesterday expressing some concerns about his right foot right ankle and the left buttock. He came in early. There is still no open areas on the left leg and that still in his juxta lite stocking 10/11; he only has 1 small area on the left buttock that remains measuring millimeters 1 mm. Still has the same irritated skin in this area. We recommended zinc oxide when this eventually closes and pressure relief is meticulously is he can do this. He still has an area on the dorsal part of his right first through third toes which is [name] bit irritated and still open and on the dorsal ankle near the crease of the ankle. We have been using silver alginate and using his own stocking. He has nothing open on the left leg or foot 10/25; 2-week follow-up. Not nearly as good on the left buttock as I was hoping. For open areas with 5 looking  threatened small. He has the erythematous irritated chronic skin in this area. oo1 area on the right dorsal ankle. He reports this area bleeds easily ooRight dorsal foot just proximal to the base of his toes ooWe have been using silver alginate. 11/8; 2-week follow-up. Left buttock is about the same although I do not think the wounds are in the same location we have been using silver alginate. I have asked him to use zinc oxide on the skin around the wounds. ooHe still has [name] small area on the right dorsal ankle he reports this bleeds easily ooRight dorsal foot just proximal to the base of the toes does not have anything open although the skin is very dry and scaly ooHe has [name] new opening on the nailbed of the left great toe. Nothing on the left ankle 11/29; 3-week follow-up. Left buttock has 2 open areas. And washing of these wounds today started bleeding easily. Suggesting very friable tissue. We have been using silver alginate. Right dorsal ankle which I thought was initially [name] wrap injury we have been using silver alginate. Nothing open between the toes that I can see. He states the area on the left dorsal toe nailbed healed after the last visit in 2 or 3 days 12/13; 3-week follow-up. His left buttock now has 3 open areas but the original 2 areas are smaller using polymen here. Surrounding skin looks better. The right dorsal ankle is closed. He has [name] small opening on the right dorsal foot at the level of the third toe. In general the skin looks better here. He is wearing his juxta lite stocking on the left leg says there is nothing open 11/24/2020; 3 [name] follow-up. His left buttock still has the 3 open areas. We have been using polymen but due to lack of response he changed to [facility] area. Surrounding skin is dry erythematous and irritated looking. There is no evidence of infection either bacterial or  fungal however there is loss of surface epithelium ooHe still has very dry skin  in his foot causing irritation and erythema on the dorsal part of his toes. This is not responded to prolonged courses of antifungal simply looks dry and irritated 1/24; left buttock area still looks about the same he was unable to find the triad ointment that we had suggested. The area on the right lower leg just above the dorsal ankle has reopened and the areas on the right foot between the first second and second third toes and scaling on the bottom of the foot has been about the same for quite some time now. been using silver alginate to all wound areas 2/7; left buttock wound looked quite good although not much smaller in terms of surface area surrounding skin looks better. Only [name] few dry flaking areas on the right foot in between the first and second toes the skin generally looks better here [ammonium lactate]. Finally the area on the right dorsal ankle is closed 2/21; ooThere is no open area on the right foot even between the right first and second toe. Skin around this area dorsally and plantar aspects look better. ooHe has [name] reopening of the area on the right ankle just above the crease of the ankle dorsally. I continue to think that this is probably friction from spasms may be even this time with his stocking under the compression stockings. ooWounds on his left buttock look about the same there [name] couple of areas that have reopened. He has [name] total square area of loss of epithelialization. This does not look like infection it looks like [name] contact dermatitis but I just cannot determine to what 3/14; there is nothing on the right foot between the first and second toes this was carefully inspected under illumination. Some chronic irritation on the dorsal part of his foot from toes 1-3 at the base. Nothing really open here substantially. Still has an area on the right foot/ankle that is actually larger and hyper granulated. His buttock area on the left is just about closed however he has  chronic inflammation with loss of the surface epithelial layer 3/28; 2-week follow-up. In clinic today with [name] new wound on the left anterior mid tibia. Says this happened about 2 [name] ago. He is not really sure how wonders about the spasticity of his legs at night whether that could have caused this other than that he does not have [name] good idea. He has been using topical antibiotics and silver alginate. The area on his right dorsal ankle seems somewhat better. ooFinally everything on his left buttock is closed. 4/11; 2-week follow-up. All of his wounds are better except for the area over the ischium and left buttock which have opened up widely again. At least part of this is covered in necrotic fibrinous material another part had rolled nonviable skin. The area on the right ankle, left anterior mid tibia are both [name] lot better. He had no open wounds on either foot including the areas between the first and second toes 4/25; patient presents for 2-week follow-up. He states that the wounds are overall stable. He has no complaints today and states he is using Hydrofera Blue to open wounds. 5/9; have not seen this man in over [name] month. For my memory he has open areas on the left mid tibia and right ankle. T oday he has new open area on the right dorsal foot which we have not had [name] problem with recently. He  has the sustained area on the left buttock He is also changed his insurance at the beginning of the year [employer]. We will need prior authorizations for debridement 5/23; patient presents for 2-week follow-up. He has prior authorizations for debridement. He denies any issues in the past 2 [name] with his wound care. He has been using Hydrofera Blue to all the wounds. He does report [name] circular rash to the upper left leg that is new. He denies acute signs of infection. 6/6; 2-week follow-up. The patient has open wounds on the left buttock which are worse than the last time I saw this about [name] month ago.  He also has [name] new area to me on the left anterior mid tibia with some surrounding erythema. The area on the dorsal ankle on the right is closed but I think this will be [name] friction injury every time this area is exposed to either our wraps or his compression stockings caused by unrelenting spasms in this leg. 6/20; 2-week follow-up. oo The patient has open wounds on the left buttock which is about the same. Using [facility] here. - The left mid tibia has [name] static amount of surrounding erythema. Also [name] raised area in the center. We have been using Hydrofera Blue here. ooooFinally he has broken down in his dorsal right foot extending between the first and second toes and going to the base of the first and second toe webspace. I have previously assumed that this was severe venous hypertension 7/5; 2-week follow-up oo The left buttock wound actually looks better. We are using Hydrofera Blue. He has extensive skin irritation around this area and I have not really been able to get that any better. I have tried Lotrisone i.e. antifungals and steroids. More most recently we have just been using Coloplast really looks about the same. oo The left mid tibia which was new last week culture to have very resistant staph aureus. Not only methicillin-resistant but doxycycline resistant. The patient has [name] plethora of antibiotic allergies including sulfa, linezolid. I used topical bacitracin on this but he has not started this yet. oo In addition he has an expanding area of erythema with [name] wound on the dorsal right foot. I did [name] deep tissue culture of this area today 7/12; oo Left buttock area actually looks better surrounding skin also looks less irritated. oo Left mid tibia looks about the same. He is using bacitracin this is not worse oo Right dorsal foot looks about the same as well. oo The left first toe also looks about the same 7/19; left buttock wound continues to improve in terms of open  areas oo Left mid tibia is still concerning amount of swelling he is using bacitracin oo Dorsal left first toe somewhat smaller oo Right dorsal foot somewhat smaller 7/25; left buttock wound actually continues to improve oo Left mid tibia area has less swelling. I gave him all my samples of new Nuzyra. This seems to have helped although the wound is still open it. His abrasion closed by here oo Left dorsal great toe really no better. Still [name] very nonviable surface oo Right dorsal foot perhaps some better. oo We have been using bacitracin and silver nitrate to the areas on his lower legs and Hydrofera Blue to the area on the buttock. 8/16 oo Disappointed that his left buttock wound is actually more substantial. Apparently during the last nurse visit these were both very small. He has continued irritation to [name] large area of skin  on his buttock. I have never been able to totally explain this although I think it some combination of the way he sits, pressure, moisture. He is not incontinent enough to contribute to this. oo Left dorsal great toe still fibrinous debris on the surface that I have debrided today oo Large area across the dorsal right toes. oo The area on the left anterior mid tibia has less swelling. He completed the [place]. This does not look infected although the tissue is still fried 8/30; 2-week follow-up. oo Left buttock areas not improved. We used Hydrofera Blue on this. Weeping wet with the surrounding erythema that I have not been able to control even with Lotrisone and topical Coloplast oo Left dorsal great toe looks about the same oo More substantial area again at the base of his toes on the left which is new this week. oo Area across the dorsal right toes looks improved oo The left anterior mid tibia looks like it is trying to close 9/13; 2-week follow-up. Using silver alginate on all of his wounds. The left dorsal foot does not look any better. He has the  area on the dorsal toe and also the areas at the base of all of his toes 1 through 3. On the right foot he has [name] similar pattern in [name] similar area. He has the area on his left mid tibia that looks fairly healthy. Finally the area on his left buttock looks somewhat bette 9/20; culture I did of the left foot which was [name] deep tissue culture last time showed E. coli he has erythema around this wound. Still [name] completely necrotic surface. His right dorsal foot looks about the same. He has [name] very friable surface to the left anterior mid tibia. Both buttock wounds look better. We have been using silver alginate to all wounds 10/4; he has completed the cephalexin that I gave him last time for the left foot. He is using topical gentamicin under silver alginate silver alginate being applied to all the wounds. Unfortunately all the wounds look irritated on his dorsal right foot dorsal left foot left mid tibia. I wonder if this could be [name] silver allergy. I am going to change him to [facility] on the lower extremity. The skin on the left buttock and left posterior thigh still flaking dry and irritated. This has continued no matter what I have applied topically to this. He has [name] solitary open wound which by itself does not look too bad however the entire area of surrounding skin does not change no matter what we have applied here 10/18; the area on the left dorsal foot and right dorsal foot both look better. The area on the right extends into the plantar but not between his toes. We have been using silver alginate. He still has [name] rectangular erythematous area around the area on the left tibia. The wound itself is very small. Finally everything on his left buttock looks [name] little larger the skin is erythematous 11/15; patient comes in with the left dorsal and right dorsal foot distally looking somewhat better. Still nonviable surface on the left foot which required debridement. He still has the area on the left  anterior mid tibia although this looks somewhat better. He has [name] new area on the right lateral lower leg just above the ankle. Finally his left buttock looks terrible with multiple superficial open wounds geometric square shaped area of chronic erythema which I have not been able to sort through 11/29; right dorsal  foot and left dorsal foot both look somewhat better. No debridement required. He has the fragile area on the left anterior mid tibia this looks and continues to look somewhat better. Right lateral lower leg just above the ankle we identified last time also looks better. In general the area on his left buttock looks improved. We are using Hydrofera Blue to all wound areas 12/13; right dorsal foot looks better. The area on the right lateral leg is healed. Left dorsal foot has 2 open areas both of which require debridement. The fragile area on the left anterior mid tibial looks better. Smaller area on his buttocks. Were using Hydrofera Blue 1/10; patient comes in with everything looking slightly larger and/or worse. This includes his left buttock, reopening of the left mid tibia, larger areas on the left dorsal foot and what looks to be [name] cellulitis on the right dorsal and plantar foot. We have been using Hydrofera Blue on all wounds. 1/17; right dorsal foot distally looks better today. The left foot has 2 open wounds that are about the same surrounding erythema. 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 No malignancy they did not stain for fungus 1/24; his right distal foot is not closed dry and scaly but the wound looks like it is contracting. I did not debride anything here. Problem on his left dorsal foot with expanding erythema. Apparently there were problems last week getting the Elesa Hacker however it is now available at the [employer] but [name] week  later. He is using ketoconazole and Coloplast to the left buttock along with Hydrofera Blue this actually looks quite [name] bit better today. 1/31; right dorsal foot again is dry and scaly but looking to contract. He has been using [name] moisturizer on his feet at my request but he is not sure which 1. The left dorsal foot wounds look about the same there is erythema here that I marked last week however after course of Nuzyra it certainly is not any better but not any worse either. Finally on his left buttock the skin continues to look better he has the original wound but [name] new substantial area towards the gluteal cleft. Almost like [name] skin tear. I used scissors to remove skin and subcutaneous tissue here silver nitrate and direct pressure 2/7; right dorsal foot. This does not look too much different from last week. Some erythema skin dry and scaly. No debridement. Left dorsal great toe again still not much improvement. I did remove flaking dry skin and callus from around the edge. Finally on his left buttock. The skin is somewhat better in the periwound. Surface wounds are superficial somewhat better than last week. Objective Constitutional Sitting or standing Blood Pressure is within target range for patient.. Pulse regular and within target range for patient.Marland Kitchen Respirations regular, non-labored and within target range.. Temperature is normal and within the target range for the patient.Marland Kitchen Appears in no distress. Vitals Time Taken: 8:14 AM, Height: 70 in, Source: Stated, Weight: 216 lbs, Source: Stated, BMI: 31, Temperature: 98 F, Pulse: 118 bpm, Respiratory Rate: 18 breaths/min, Blood Pressure: 128/77 mmHg. General Notes: Wound exam; right foot looks about the same. Left dorsal foot great toe I removed skin and callus from around the wound edge. oo Left buttock again looks this same. Surrounding skin not quite as bad. Integumentary (Hair, Skin) Wound #41R status is Open. Original cause of wound was  Gradually Appeared. The date acquired was: 03/16/2020. The wound has been in treatment 93 [name]. The wound is located on the Left Ischium. The wound measures 2cm length x 2.8cm width x 0.1cm depth; 4.398cm^2 area and 0.44cm^3 volume. There is Fat Layer (Subcutaneous Tissue) exposed. There is no tunneling or undermining noted. There is [name] medium amount of serosanguineous drainage noted. The wound margin is distinct with the outline attached to the wound base. There is large (67-100%) red granulation within the wound bed. There is no necrotic tissue within the wound bed. Wound #51R status is Healed - Epithelialized. Original cause of wound was Trauma. The date acquired was: 01/26/2021. The wound has been in treatment 45 [name]. The wound is located on the Left,Anterior Lower Leg. The wound measures 0cm length x 0cm width x 0cm depth; 0cm^2 area and 0cm^3 volume. There is [name] medium amount of serosanguineous drainage noted. Wound #52 status is Open. Original cause of wound was Gradually Appeared. The date acquired was: 03/27/2021. The wound has been in treatment 39 [name]. The wound is located on the Right,Dorsal Foot. The wound measures 1.6cm length x 4.5cm width x 0.1cm depth; 5.655cm^2 area and 0.565cm^3 volume. There is Fat Layer (Subcutaneous Tissue) exposed. There is no tunneling or undermining noted. There is [name] medium amount of serosanguineous drainage noted. The wound margin is distinct with the outline attached to the wound base. There is small (1-33%) pink, pale granulation within the wound bed. There is [name] large (67- 100%) amount of necrotic tissue within the wound bed including Adherent Slough. Wound #56 status is Open. Original cause of wound was Gradually Appeared. The date acquired was: 07/11/2021. The wound has been in treatment 23 [name]. The wound is located on the Left,Dorsal Foot. The wound measures 2.2cm length x 1.4cm width x 0.1cm depth; 2.419cm^2 area and 0.242cm^3 volume. There is Fat Layer  (Subcutaneous Tissue) exposed. There is no tunneling or undermining noted. There is [name] medium amount of serosanguineous drainage noted. The wound margin is distinct with the outline attached to the wound base. There is medium (34-66%) pink, pale granulation within the wound bed. There is [name] medium (34-66%) amount of necrotic tissue within the wound bed including Adherent Slough. Wound #57 status is Open. Original cause of wound was Gradually Appeared. The date acquired was: 08/25/2021. The wound has been in treatment 18 [name]. The wound is located on the [place]. The wound measures 0.1cm length x 0.1cm width x 0.1cm depth; 0.008cm^2 area and 0.001cm^3 volume. There is Fat Layer (Subcutaneous Tissue) exposed. There is no tunneling or undermining noted. There is [name] small amount of serous drainage noted. The wound margin is distinct with the outline attached to the wound base. There is small (1-33%) pink granulation within the wound bed. There is no necrotic tissue within the wound bed. Wound #60 status is Healed - Epithelialized. Original cause of wound was Gradually Appeared. The date acquired was: 12/08/2021. The wound has been in treatment 3 [name]. The wound is located on the Right,Lateral Lower Leg. The wound measures 0cm length x 0cm width x 0cm depth; 0cm^2 area and 0cm^3 volume. There is [name] medium amount of serosanguineous drainage noted. 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 - Dr. Dellia Nims Bathing/ Shower/ Hygiene: May shower and wash wound with soap and water. - on days that dressing is changed Edema Control - Lymphedema / SCD / Other: Elevate legs to the level of the heart or above for 30 minutes daily and/or when sitting, [name] frequency of: - throughout the day Moisturize legs daily. - with dressing changes Compression stocking or Garment 30-40 mm/Hg pressure to: - Juxtalite to both legs daily Off-Loading: Roho cushion for wheelchair Turn and reposition every 2 hours WOUND #41R: - Ischium Wound Laterality: Left Cleanser: Soap and Water Every Other Day/30 Days Discharge Instructions: May shower and wash wound with dial antibacterial soap and water prior to dressing change. Peri-Wound Care: Ketoconazole Cream 2% Every Other Day/30 Days Discharge Instructions: Apply Ketoconazole as directed Peri-Wound Care: Triad Hydrophilic Wound Dressing Tube, 6 (oz) Every Other Day/30 Days Discharge Instructions: Apply to periwound with each dressing change Prim Dressing: Hydrofera Blue Classic Foam, 4x4 in Every Other Day/30 Days ary Discharge Instructions: Moisten with saline prior to applying to wound bed Secondary Dressing: Zetuvit Plus Silicone Border Dressing 5x5 (in/in) Every Other Day/30 Days Discharge Instructions: Apply silicone border over primary dressing as directed. Secured With: 62M Medipore H Soft Cloth Surgical T 4 x 2 (in/yd) (Generic) Every Other Day/30 Days ape Discharge Instructions: Secure dressing with tape as directed. WOUND #52: - Foot Wound Laterality: Dorsal, Right Cleanser: Soap and Water Every Other Day/30 Days Discharge Instructions: May shower and wash wound with dial antibacterial soap and water prior to dressing change. Prim Dressing: Hydrofera Blue Classic Foam, 4x4 in Every Other Day/30 Days ary Discharge Instructions: Moisten with saline prior to applying to wound  bed Secondary Dressing: Woven Gauze Sponge, Non-Sterile 4x4 in Every Other Day/30 Days Discharge Instructions: Apply over primary dressing as directed. Secured With: [employer], 4.5x3.1 (in/yd) Every Other Day/30 Days Discharge Instructions: Secure with Kerlix as directed. Secured With: 62M Medipore H Soft Cloth Surgical T ape, 4 x 10 (in/yd) (Generic) Every Other Day/30 Days Discharge Instructions: Secure with tape as directed. WOUND #56: - Foot Wound Laterality: Dorsal, Left Cleanser: Soap and Water Every Other Day/30 Days Discharge Instructions: May shower and wash wound with dial antibacterial soap and water prior to dressing change. Topical: Mupirocin Ointment Every Other Day/30 Days Discharge Instructions: Apply Mupirocin (Bactroban) as instructed Prim Dressing: Hydrofera Blue Classic Foam, 4x4 in (Generic) Every Other Day/30 Days ary Discharge Instructions: Moisten with saline prior to applying to wound bed Secondary Dressing: Woven Gauze Sponge, Non-Sterile 4x4 in Every Other Day/30 Days Discharge Instructions: Apply over primary dressing as directed. Secured With: [employer], 4.5x3.1 (in/yd) Every Other Day/30 Days Discharge Instructions: Secure with Kerlix as directed. Secured With: 62M Medipore H Soft Cloth Surgical T ape, 4 x 10 (in/yd) (Generic) Every Other Day/30 Days Discharge Instructions: Secure with tape as directed. WOUND #57: - Foot Wound Laterality: Plantar, Right Cleanser: Soap and Water Every Other Day/30 Days Discharge Instructions: May shower and wash wound with dial antibacterial soap and water prior to dressing change. Prim Dressing: Hydrofera Blue Classic Foam, 4x4 in Every Other Day/30 Days ary Discharge Instructions: Moisten with saline prior to applying to wound bed Secondary Dressing: Woven Gauze Sponge, Non-Sterile 4x4 in Every Other Day/30 Days Discharge Instructions: Apply over  primary dressing as directed. Secured With: [employer], 4.5x3.1 (in/yd) Every Other Day/30 Days Discharge Instructions: Secure with Kerlix as directed. Secured With: 84M Medipore H Soft Cloth Surgical T 4 x 2 (in/yd) (Generic) Every Other Day/30 Days ape Discharge Instructions: Secure dressing with tape as directed. 1 am still using Hydrofera Blue to all wounds. 2. Ketoconazole and Coloplast to the periwound skin on the left buttock. I had minimal loss to really explain this. This all may be friction pressure moisture but I can understand why it is just on one side I have never been able to explain this. 3. I think the patient has significant venous hypertension and this accounts for the wounds on his legs and feet that we have dealt with for so long now. Electronic Signature(s) Signed: 12/29/2021 5:02:53 PM By: Linton Ham MD Signed: 12/29/2021 5:02:53 PM By: Linton Ham MD Entered By: Linton Ham on 12/29/2021 09:43:44 -------------------------------------------------------------------------------- SuperBill Details Patient Name: Date of Service: Lodes, [name] [name] 12/29/2021 Medical Record Number: [id number] Patient Account Number: [id number] Date of Birth/Sex: Treating RN: [birth year] (36 y.o. [name] [name] Mention Primary Care Provider: [place], [place] Other Clinician: Referring Provider: Treating Provider/Extender: [name] [name] in Treatment: 312 Diagnosis Coding ICD-10 Codes Code Description I87.332 Chronic venous hypertension (idiopathic) with ulcer and inflammation of left lower extremity L97.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, stage 3 L97.821 Non-pressure chronic ulcer of other part of left lower leg limited to breakdown of skin L97.518 Non-pressure chronic ulcer of other part of right foot with other 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, 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 Non-pressure chronic ulcer of other part of left foot  limited to breakdown of skin L03.115 Cellulitis of right lower limb Facility Procedures CPT4 Code: 32202542 Description: 70623 - WOUND CARE VISIT-LEV 5 EST PT Modifier: Quantity: 1 Physician Procedures : CPT4 Code Description Modifier [id number] - WC PHYS LEVEL 3 - EST PT ICD-10 Diagnosis Description L97.511 Non-pressure chronic ulcer of other part of right foot limited to breakdown of skin L97.521 Non-pressure chronic ulcer of other part of left  foot limited to breakdown of skin I87.332 Chronic venous hypertension (idiopathic) with ulcer and inflammation of left lower extremity L89.323 Pressure ulcer of left buttock, stage 3 Quantity: 1 Electronic Signature(s) Signed: 12/29/2021 5:02:53 PM By: Linton Ham MD Entered By: Linton Ham on 12/29/2021 09:44:17

## 2022-01-06 ENCOUNTER — Ambulatory Visit (INDEPENDENT_AMBULATORY_CARE_PROVIDER_SITE_OTHER): Payer: BC Managed Care – PPO | Admitting: Behavioral Health

## 2022-01-06 ENCOUNTER — Encounter: Payer: Self-pay | Admitting: Behavioral Health

## 2022-01-06 ENCOUNTER — Other Ambulatory Visit: Payer: Self-pay

## 2022-01-06 DIAGNOSIS — Z9989 Dependence on other enabling machines and devices: Secondary | ICD-10-CM

## 2022-01-06 DIAGNOSIS — N319 Neuromuscular dysfunction of bladder, unspecified: Secondary | ICD-10-CM

## 2022-01-06 DIAGNOSIS — F341 Dysthymic disorder: Secondary | ICD-10-CM

## 2022-01-06 DIAGNOSIS — G4733 Obstructive sleep apnea (adult) (pediatric): Secondary | ICD-10-CM

## 2022-01-06 MED ORDER — SERTRALINE HCL 100 MG PO TABS: ORAL_TABLET | ORAL | 4 refills | Status: DC

## 2022-01-06 NOTE — Progress Notes (Signed)
[facility]  Patient ID: [name],  MRN: [id number]  PCP: Janine Limbo, PA-C  Date of Evaluation: 01/06/2022 Time spent:10 minutes  Chief Complaint:  Chief Complaint   Anxiety; Depression; Follow-up; Medication Refill     HISTORY/CURRENT STATUS: HPI 36 year old male presents to this office for follow up and medication refill. He is former patient to Dr. Milana Huntsman retired. He says that he continues to do very well and is stable. He does not want to make any changes to his medications at this time. He reports 0/10 depression, and 0/10 anxiety. He sleeps 7 plus hours per night. Denies any significant social changes. He is wheelchair bound and has non healing wounds on both legs bilaterally. He goes to wound care once weekly. No changes in his social life. He denies mania, no psychosis. No SI/HI.    No prior psychiatric medication failures   Individual Medical History/ Review of Systems: Changes? :No   Allergies: Sulfa antibiotics, Levofloxacin, Meropenem, Penicillins, and Zyvox [linezolid]  Current Medications:  Current Outpatient Medications:    Amino Acids-Protein Hydrolys (FEEDING SUPPLEMENT, PRO-STAT SUGAR FREE 64,) LIQD, Take 30 mLs by mouth 2 (two) times daily., Disp: , Rfl:    Ascorbic Acid (VITAMIN C) 1000 MG tablet, Take 1,000 mg by mouth daily., Disp: , Rfl:    baclofen (LIORESAL) 20 MG tablet, Take 40-60 mg by mouth 3 (three) times daily. 60mg  in the morning, 40mg  in the afternoon, and 60mg  in the evening, Disp: , Rfl:    imipramine (TOFRANIL) 50 MG tablet, Take 50 mg by mouth at bedtime. , Disp: , Rfl:    lisinopril-hydrochlorothiazide (PRINZIDE,ZESTORETIC) 20-25 MG per tablet, Take 1 tablet by mouth daily., Disp: , Rfl:    Melatonin 10 MG CAPS, Take by mouth. Nightly, Disp: , Rfl:    Multiple Vitamin (MULTIVITAMIN) tablet, Take 1 tablet by mouth daily., Disp: , Rfl:    Omadacycline Tosylate (NUZYRA) 150 MG TABS, Take 3 tablets by mouth daily for 2  days, then take 2 tablets by mouth daily for 6 days., Disp: 18 tablet, Rfl: 0   oxybutynin (DITROPAN XL) 15 MG 24 hr tablet, Take 30 mg by mouth at bedtime. , Disp: , Rfl:    sertraline (ZOLOFT) 100 MG tablet, TAKE 1 TABLET(100 MG) BY MOUTH DAILY AFTER BREAKFAST, Disp: 30 tablet, Rfl: 4   zinc sulfate 220 (50 Zn) MG capsule, Take 220 mg by mouth daily., Disp: , Rfl:  Medication Side Effects: none  Family Medical/ Social History: Changes? No  MENTAL HEALTH EXAM:  There were no vitals taken for this visit.There is no height or weight on file to calculate BMI.  General Appearance: Casual and Neat in Wheel Chair  Eye Contact:  NA  Speech:  Clear and Coherent  Volume:  Decreased  Mood:  NA  Affect:  Appropriate  Thought Process:  Coherent  Orientation:  Full (Time, Place, and Person)  Thought Content: Logical   Suicidal Thoughts:  No  Homicidal Thoughts:  No  Memory:  WNL  Judgement:  Good  Insight:  Good  Psychomotor Activity:  Normal  Concentration:  Concentration: Good  Recall:  Good  Fund of Knowledge: Good  Language: Good  Assets:  Desire for Improvement  ADL's:  Intact  Cognition: WNL  Prognosis:  Good    DIAGNOSES:    ICD-10-CM   1. Persistent depressive disorder with melancholic features, currently mild  F34.1 sertraline (ZOLOFT) 100 MG tablet    2. Obstructive sleep apnea  treated with continuous positive airway pressure (CPAP)  G47.33    Z99.89     3. Neurogenic bladder  N31.9       Receiving Psychotherapy: No    RECOMMENDATIONS:  Greater than 50% of  10  min face to face time with patient was spent on counseling and coordination of care. Marland Kitchen He continue to be stable and wants to continue with Zoloft 100 mg. He agrees to follow up in 3 months.  He is escribed Zoloft 100 mg tablet every morning [facility] as [facility] [address] [facility]ymia.  He has CPAP and Tofranil for sleep apnea and neurogenic bladder from his medical  services.   He does take melatonin 10 mg nightly as needed for insomnia OTC.   Will report and worsening symptoms promptly Provided emergency contact information Reviewed PDMP        Elwanda [place], NP

## 2022-01-12 ENCOUNTER — Encounter (HOSPITAL_BASED_OUTPATIENT_CLINIC_OR_DEPARTMENT_OTHER): Payer: BC Managed Care – PPO | Admitting: Internal Medicine

## 2022-01-12 ENCOUNTER — Other Ambulatory Visit: Payer: Self-pay

## 2022-01-12 DIAGNOSIS — L89323 Pressure ulcer of left buttock, stage 3: Secondary | ICD-10-CM | POA: Diagnosis not present

## 2022-01-12 DIAGNOSIS — G8221 Paraplegia, complete: Secondary | ICD-10-CM | POA: Diagnosis not present

## 2022-01-12 DIAGNOSIS — L97812 Non-pressure chronic ulcer of other part of right lower leg with fat layer exposed: Secondary | ICD-10-CM | POA: Diagnosis not present

## 2022-01-12 DIAGNOSIS — L97821 Non-pressure chronic ulcer of other part of left lower leg limited to breakdown of skin: Secondary | ICD-10-CM | POA: Diagnosis not present

## 2022-01-12 DIAGNOSIS — I87332 Chronic venous hypertension (idiopathic) with ulcer and inflammation of left lower extremity: Secondary | ICD-10-CM | POA: Diagnosis not present

## 2022-01-12 DIAGNOSIS — L03115 Cellulitis of right lower limb: Secondary | ICD-10-CM | POA: Diagnosis not present

## 2022-01-12 DIAGNOSIS — L97512 Non-pressure chronic ulcer of other part of right foot with fat layer exposed: Secondary | ICD-10-CM | POA: Diagnosis not present

## 2022-01-12 DIAGNOSIS — L97522 Non-pressure chronic ulcer of other part of left foot with fat layer exposed: Secondary | ICD-10-CM | POA: Diagnosis not present

## 2022-01-12 NOTE — Progress Notes (Signed)
[name], [name] ([id number]) Visit Report for 01/12/2022 Debridement Details Patient Name: Date of Service: [name], [name] 01/12/2022 8:00 A M Medical Record Number: [id number] Patient Account Number: [id number] Date of Birth/Sex: Treating RN: 1986/11/14 (36 y.o. Collene Gobble Primary Care Provider: [place], [place] Other Clinician: Referring Provider: Treating Provider/Extender: [name]: 314 Debridement Performed for Assessment: Wound #61 Right,Medial Lower Leg Performed By: Physician Ricard Dillon., MD Debridement Type: Debridement Level of Consciousness (Pre-procedure): Awake and Alert Pre-procedure Verification/Time Out Yes - 09:24 Taken: Start Time: 09:24 T Area Debrided (L x W): otal 1 (cm) x 0.8 (cm) = 0.8 (cm) Tissue and other material debrided: Slough, Subcutaneous, Slough Level: Skin/Subcutaneous Tissue Debridement Description: Excisional Instrument: Curette Bleeding: Moderate Hemostasis Achieved: Pressure End Time: 09:25 Procedural Pain: 0 Post Procedural Pain: 0 Response to Treatment: Procedure was tolerated well Level of Consciousness (Post- Awake and Alert procedure): Post Debridement Measurements of Total Wound Length: (cm) 1 Width: (cm) 0.8 Depth: (cm) 0.1 Volume: (cm) 0.063 Character of Wound/Ulcer Post Debridement: Improved Post Procedure Diagnosis Same as Pre-procedure Electronic Signature(s) Signed: 01/12/2022 12:34:06 PM By: Dellie Catholic RN Signed: 01/12/2022 4:44:59 PM By: Linton Ham MD Entered By: Dellie Catholic on 01/12/2022 09:26:27 -------------------------------------------------------------------------------- Debridement Details Patient Name: Date of Service: [name], [name] 01/12/2022 8:00 A M Medical Record Number: [id number] Patient Account Number: [id number] Date of Birth/Sex: Treating RN: [birth year] (36 y.o. Collene Gobble Primary Care Provider: [place], [place] Other  Clinician: Referring Provider: Treating Provider/Extender: [name]: 314 Debridement Performed for Assessment: Wound #52 Right,Dorsal Foot Performed By: Physician Ricard Dillon., MD Debridement Type: Debridement Level of Consciousness (Pre-procedure): Awake and Alert Pre-procedure Verification/Time Out Yes - 09:24 Taken: Start Time: 09:24 T Area Debrided (L x W): otal 0.5 (cm) x 2 (cm) = 1 (cm) Tissue and other material debrided: Non-Viable, Slough, Subcutaneous, Slough Level: Skin/Subcutaneous Tissue Debridement Description: Excisional Instrument: Blade Bleeding: Minimum Hemostasis Achieved: Pressure End Time: 09:25 Procedural Pain: 0 Post Procedural Pain: 0 Response to Treatment: Procedure was tolerated well Level of Consciousness (Post- Awake and Alert procedure): Post Debridement Measurements of Total Wound Length: (cm) 0.5 Width: (cm) 2 Depth: (cm) 0.1 Volume: (cm) 0.079 Character of Wound/Ulcer Post Debridement: Improved Post Procedure Diagnosis Same as Pre-procedure Electronic Signature(s) Signed: 01/12/2022 12:34:06 PM By: Dellie Catholic RN Signed: 01/12/2022 4:44:59 PM By: Linton Ham MD Entered By: Dellie Catholic on 01/12/2022 09:27:53 -------------------------------------------------------------------------------- Physician Orders Details Patient Name: Date of Service: [name], [name] 01/12/2022 8:00 A M Medical Record Number: [id number] Patient Account Number: [id number] Date of Birth/Sex: Treating RN: [birth year] (36 y.o. Collene Gobble Primary Care Provider: [place], [place] Other Clinician: Referring Provider: Treating Provider/Extender: [name]: 314 Verbal / Phone Orders: No Diagnosis Coding Follow-up Appointments ppointment in 2 weeks. - Dr. Reyes Ivan Return A Bathing/ Shower/ Hygiene May shower and wash wound with soap and water. - on days that  dressing is changed Edema Control - Lymphedema / SCD / Other Elevate legs to the level of the heart or above for 30 minutes daily and/or when sitting, a frequency of: - throughout the day Moisturize legs daily. - with dressing changes Compression stocking or Garment 30-40 mm/Hg pressure to: - Juxtalite to both legs daily Off-Loading Roho cushion for wheelchair Turn and reposition every 2 hours Wound Treatment Wound #41R - Ischium Wound Laterality: Left Cleanser: Soap and Water Every Other Day/30 Days Discharge Instructions: May shower and  wash wound with dial antibacterial soap and water prior to dressing change. Peri-Wound Care: Ketoconazole Cream 2% Every Other Day/30 Days Discharge Instructions: Apply Ketoconazole as directed Peri-Wound Care: Triad Hydrophilic Wound Dressing Tube, 6 (oz) Every Other Day/30 Days Discharge Instructions: Apply to periwound with each dressing change Prim Dressing: Hydrofera Blue Classic Foam, 4x4 in Every Other Day/30 Days ary Discharge Instructions: Moisten with saline prior to applying to wound bed Secondary Dressing: Zetuvit Plus Silicone Border Dressing 5x5 (in/in) Every Other Day/30 Days Discharge Instructions: Apply silicone border over primary dressing as directed. Secured With: 58M Medipore H Soft Cloth Surgical T 4 x 2 (in/yd) (Generic) Every Other Day/30 Days ape Discharge Instructions: Secure dressing with tape as directed. Wound #52 - Foot Wound Laterality: Dorsal, Right Cleanser: Soap and Water Every Other Day/30 Days Discharge Instructions: May shower and wash wound with dial antibacterial soap and water prior to dressing change. Peri-Wound Care: Ketoconazole Cream 2% Every Other Day/30 Days Discharge Instructions: Apply Ketoconazole as directed Peri-Wound Care: Triamcinolone 15 (g) Every Other Day/30 Days Discharge Instructions: Use triamcinolone 15 (g) as directed Prim Dressing: Hydrofera Blue Classic Foam, 4x4 in Every Other Day/30  Days ary Discharge Instructions: Moisten with saline prior to applying to wound bed Secondary Dressing: Woven Gauze Sponge, Non-Sterile 4x4 in Every Other Day/30 Days Discharge Instructions: Apply over primary dressing as directed. Secured With: [employer], 4.5x3.1 (in/yd) Every Other Day/30 Days Discharge Instructions: Secure with Kerlix as directed. Secured With: 58M Medipore H Soft Cloth Surgical T ape, 4 x 10 (in/yd) (Generic) Every Other Day/30 Days Discharge Instructions: Secure with tape as directed. Wound #56 - Foot Wound Laterality: Dorsal, Left Cleanser: Soap and Water Every Other Day/30 Days Discharge Instructions: May shower and wash wound with dial antibacterial soap and water prior to dressing change. Peri-Wound Care: Ketoconazole Cream 2% Every Other Day/30 Days Discharge Instructions: Apply Ketoconazole as directed Peri-Wound Care: Triamcinolone 15 (g) Every Other Day/30 Days Discharge Instructions: Use triamcinolone 15 (g) as directed Prim Dressing: Hydrofera Blue Classic Foam, 4x4 in Every Other Day/30 Days ary Discharge Instructions: Moisten with saline prior to applying to wound bed Secondary Dressing: Woven Gauze Sponge, Non-Sterile 4x4 in Every Other Day/30 Days Discharge Instructions: Apply over primary dressing as directed. Secured With: [employer], 4.5x3.1 (in/yd) Every Other Day/30 Days Discharge Instructions: Secure with Kerlix as directed. Secured With: 58M Medipore H Soft Cloth Surgical T ape, 4 x 10 (in/yd) (Generic) Every Other Day/30 Days Discharge Instructions: Secure with tape as directed. Wound #57 - Foot Wound Laterality: Plantar, Right Cleanser: Soap and Water Every Other Day/30 Days Discharge Instructions: May shower and wash wound with dial antibacterial soap and water prior to dressing change. Peri-Wound Care: Ketoconazole Cream 2% Every Other Day/30 Days Discharge Instructions: Apply Ketoconazole as directed Peri-Wound Care:  Triamcinolone 15 (g) Every Other Day/30 Days Discharge Instructions: Use triamcinolone 15 (g) as directed Prim Dressing: Hydrofera Blue Classic Foam, 4x4 in Every Other Day/30 Days ary Discharge Instructions: Moisten with saline prior to applying to wound bed Secondary Dressing: Woven Gauze Sponge, Non-Sterile 4x4 in Every Other Day/30 Days Discharge Instructions: Apply over primary dressing as directed. Secured With: [employer], 4.5x3.1 (in/yd) Every Other Day/30 Days Discharge Instructions: Secure with Kerlix as directed. Secured With: 58M Medipore H Soft Cloth Surgical T ape, 4 x 10 (in/yd) (Generic) Every Other Day/30 Days Discharge Instructions: Secure with tape as directed. Wound #61 - Lower Leg Wound Laterality: Right, Medial Cleanser: Soap and Water Every Other Day/30  Days Discharge Instructions: May shower and wash wound with dial antibacterial soap and water prior to dressing change. Peri-Wound Care: Ketoconazole Cream 2% Every Other Day/30 Days Discharge Instructions: Apply Ketoconazole as directed Peri-Wound Care: Triamcinolone 15 (g) Every Other Day/30 Days Discharge Instructions: Use triamcinolone 15 (g) as directed Prim Dressing: Hydrofera Blue Classic Foam, 4x4 in Every Other Day/30 Days ary Discharge Instructions: Moisten with saline prior to applying to wound bed Secondary Dressing: Woven Gauze Sponge, Non-Sterile 4x4 in Every Other Day/30 Days Discharge Instructions: Apply over primary dressing as directed. Secured With: [employer], 4.5x3.1 (in/yd) Every Other Day/30 Days Discharge Instructions: Secure with Kerlix as directed. Secured With: 35M Medipore H Soft Cloth Surgical T ape, 4 x 10 (in/yd) (Generic) Every Other Day/30 Days Discharge Instructions: Secure with tape as directed. Patient Medications llergies: penicillin, Sulfa (Sulfonamide Antibiotics), Levaquin, meropenem, Zyvox A Notifications Medication Indication Start  End 01/12/2022 Lotrisone DOSE topical 1 %-0.05 % cream - cream topical to both feet daily Electronic Signature(s) Signed: 01/12/2022 9:58:09 AM By: Linton Ham MD Entered By: Linton Ham on 01/12/2022 09:58:06 -------------------------------------------------------------------------------- Progress Note Details Patient Name: Date of Service: [place]inklea, [name] 01/12/2022 8:00 A M Medical Record Number: [id number] Patient Account Number: [id number] Date of Birth/Sex: Treating RN: 1986/10/15 (36 y.o. Collene Gobble Primary Care Provider: [place], [place] Other Clinician: Referring Provider: Treating Provider/Extender: Dutch Gray, [name]: 314 Subjective Objective Constitutional Vitals Time Taken: 8:15 AM, Height: 70 in, Weight: 216 lbs, BMI: 31, Temperature: 98.5 F, Pulse: 121 bpm, Respiratory Rate: 18 breaths/min, Blood Pressure: 133/84 mmHg. Integumentary (Hair, Skin) Wound #41R status is Open. Original cause of wound was Gradually Appeared. The date acquired was: 03/16/2020. The wound has been in treatment 95 weeks. The wound is located on the Left Ischium. The wound measures 5.5cm length x 3.5cm width x 0.1cm depth; 15.119cm^2 area and 1.512cm^3 volume. There is no tunneling or undermining noted. There is a medium amount of serosanguineous drainage noted. The wound margin is distinct with the outline attached to the wound base. There is medium (34-66%) red granulation within the wound bed. There is a medium (34-66%) amount of necrotic tissue within the wound bed. General Notes: cluster of 3 wound Wound #52 status is Open. Original cause of wound was Gradually Appeared. The date acquired was: 03/27/2021. The wound has been in treatment 41 weeks. The wound is located on the Right,Dorsal Foot. The wound measures 0.5cm length x 2cm width x 0.1cm depth; 0.785cm^2 area and 0.079cm^3 volume. There is Fat Layer (Subcutaneous Tissue) exposed. There is no  tunneling or undermining noted. There is a medium amount of serosanguineous drainage noted. The wound margin is distinct with the outline attached to the wound base. There is medium (34-66%) pink, pale granulation within the wound bed. There is a medium (34-66%) amount of necrotic tissue within the wound bed including Adherent Slough. Wound #56 status is Open. Original cause of wound was Gradually Appeared. The date acquired was: 07/11/2021. The wound has been in treatment 25 weeks. The wound is located on the Left,Dorsal Foot. The wound measures 1.8cm length x 1cm width x 0.1cm depth; 1.414cm^2 area and 0.141cm^3 volume. There is Fat Layer (Subcutaneous Tissue) exposed. There is no tunneling or undermining noted. There is a medium amount of serosanguineous drainage noted. The wound margin is distinct with the outline attached to the wound base. There is medium (34-66%) pink, pale granulation within the wound bed. There is a medium (34-66%) amount of necrotic tissue  within the wound bed including Adherent Slough. Wound #57 status is Open. Original cause of wound was Gradually Appeared. The date acquired was: 08/25/2021. The wound has been in treatment 20 weeks. The wound is located on the Ray. The wound measures 0.3cm length x 0.3cm width x 0.1cm depth; 0.071cm^2 area and 0.007cm^3 volume. There is Fat Layer (Subcutaneous Tissue) exposed. There is no tunneling or undermining noted. There is a medium amount of serous drainage noted. The wound margin is distinct with the outline attached to the wound base. There is medium (34-66%) pink granulation within the wound bed. There is a medium (34- 66%) amount of necrotic tissue within the wound bed including Adherent Slough. Wound #61 status is Open. Original cause of wound was Gradually Appeared. The date acquired was: 01/10/2022. The wound is located on the Right,Medial Lower Leg. The wound measures 1cm length x 0.8cm width x 0.1cm depth;  0.628cm^2 area and 0.063cm^3 volume. There is Fat Layer (Subcutaneous Tissue) exposed. There is no tunneling or undermining noted. There is a medium amount of serosanguineous drainage noted. There is medium (34-66%) pink granulation within the wound bed. There is a medium (34-66%) amount of necrotic tissue within the wound bed including Adherent Slough. Wound #61 status is Open. Original cause of wound was Gradually Appeared. The date acquired was: 01/10/2022. The wound is located on the Right,Medial Lower Leg. The wound measures 1cm length x 0.8cm width x 0.1cm depth; 0.628cm^2 area and 0.063cm^3 volume. There is Fat Layer (Subcutaneous Tissue) exposed. There is no tunneling or undermining noted. There is a medium amount of serosanguineous drainage noted. There is medium (34-66%) pink granulation within the wound bed. There is a medium (34-66%) amount of necrotic tissue within the wound bed including Adherent Slough. Procedures Wound #52 Pre-procedure diagnosis of Wound #52 is a Trauma, Other located on the Right,Dorsal Foot . There was a Excisional Skin/Subcutaneous Tissue Debridement with a total area of 1 sq cm performed by Ricard Dillon., MD. With the following instrument(s): Blade to remove Non-Viable tissue/material. Material removed includes Subcutaneous Tissue and Slough and. A time out was conducted at 09:24, prior to the start of the procedure. A Minimum amount of bleeding was controlled with Pressure. The procedure was tolerated well with a pain level of 0 throughout and a pain level of 0 following the procedure. Post Debridement Measurements: 0.5cm length x 2cm width x 0.1cm depth; 0.079cm^3 volume. Character of Wound/Ulcer Post Debridement is improved. Post procedure Diagnosis Wound #52: Same as Pre-Procedure Wound #61 Pre-procedure diagnosis of Wound #61 is a T be determined located on the Right,Medial Lower Leg . There was a Excisional Skin/Subcutaneous Tissue o Debridement with  a total area of 0.8 sq cm performed by Ricard Dillon., MD. With the following instrument(s): Curette Material removed includes Subcutaneous Tissue and Slough and. No specimens were taken. A time out was conducted at 09:24, prior to the start of the procedure. A Moderate amount of bleeding was controlled with Pressure. The procedure was tolerated well with a pain level of 0 throughout and a pain level of 0 following the procedure. Post Debridement Measurements: 1cm length x 0.8cm width x 0.1cm depth; 0.063cm^3 volume. Character of Wound/Ulcer Post Debridement is improved. Post procedure Diagnosis Wound #61: Same as Pre-Procedure Plan Follow-up Appointments: Return Appointment in 2 weeks. - Dr. Reyes Ivan Bathing/ Shower/ Hygiene: May shower and wash wound with soap and water. - on days that dressing is changed Edema Control - Lymphedema / SCD / Other:  Elevate legs to the level of the heart or above for 30 minutes daily and/or when sitting, a frequency of: - throughout the day Moisturize legs daily. - with dressing changes Compression stocking or Garment 30-40 mm/Hg pressure to: - Juxtalite to both legs daily Off-Loading: Roho cushion for wheelchair Turn and reposition every 2 hours The following medication(s) was prescribed: Lotrisone topical 1 %-0.05 % cream cream topical to both feet daily starting 01/12/2022 WOUND #41R: - Ischium Wound Laterality: Left Cleanser: Soap and Water Every Other Day/30 Days Discharge Instructions: May shower and wash wound with dial antibacterial soap and water prior to dressing change. Peri-Wound Care: Ketoconazole Cream 2% Every Other Day/30 Days Discharge Instructions: Apply Ketoconazole as directed Peri-Wound Care: Triad Hydrophilic Wound Dressing Tube, 6 (oz) Every Other Day/30 Days Discharge Instructions: Apply to periwound with each dressing change Prim Dressing: Hydrofera Blue Classic Foam, 4x4 in Every Other Day/30 Days ary Discharge  Instructions: Moisten with saline prior to applying to wound bed Secondary Dressing: Zetuvit Plus Silicone Border Dressing 5x5 (in/in) Every Other Day/30 Days Discharge Instructions: Apply silicone border over primary dressing as directed. Secured With: 684M Medipore H Soft Cloth Surgical T 4 x 2 (in/yd) (Generic) Every Other Day/30 Days ape Discharge Instructions: Secure dressing with tape as directed. WOUND #52: - Foot Wound Laterality: Dorsal, Right Cleanser: Soap and Water Every Other Day/30 Days Discharge Instructions: May shower and wash wound with dial antibacterial soap and water prior to dressing change. Peri-Wound Care: Ketoconazole Cream 2% Every Other Day/30 Days Discharge Instructions: Apply Ketoconazole as directed Peri-Wound Care: Triamcinolone 15 (g) Every Other Day/30 Days Discharge Instructions: Use triamcinolone 15 (g) as directed Prim Dressing: Hydrofera Blue Classic Foam, 4x4 in Every Other Day/30 Days ary Discharge Instructions: Moisten with saline prior to applying to wound bed Secondary Dressing: Woven Gauze Sponge, Non-Sterile 4x4 in Every Other Day/30 Days Discharge Instructions: Apply over primary dressing as directed. Secured With: [employer], 4.5x3.1 (in/yd) Every Other Day/30 Days Discharge Instructions: Secure with Kerlix as directed. Secured With: 684M Medipore H Soft Cloth Surgical T ape, 4 x 10 (in/yd) (Generic) Every Other Day/30 Days Discharge Instructions: Secure with tape as directed. WOUND #56: - Foot Wound Laterality: Dorsal, Left Cleanser: Soap and Water Every Other Day/30 Days Discharge Instructions: May shower and wash wound with dial antibacterial soap and water prior to dressing change. Peri-Wound Care: Ketoconazole Cream 2% Every Other Day/30 Days Discharge Instructions: Apply Ketoconazole as directed Peri-Wound Care: Triamcinolone 15 (g) Every Other Day/30 Days Discharge Instructions: Use triamcinolone 15 (g) as directed Prim Dressing:  Hydrofera Blue Classic Foam, 4x4 in Every Other Day/30 Days ary Discharge Instructions: Moisten with saline prior to applying to wound bed Secondary Dressing: Woven Gauze Sponge, Non-Sterile 4x4 in Every Other Day/30 Days Discharge Instructions: Apply over primary dressing as directed. Secured With: [employer], 4.5x3.1 (in/yd) Every Other Day/30 Days Discharge Instructions: Secure with Kerlix as directed. Secured With: 684M Medipore H Soft Cloth Surgical T ape, 4 x 10 (in/yd) (Generic) Every Other Day/30 Days Discharge Instructions: Secure with tape as directed. WOUND #57: - Foot Wound Laterality: Plantar, Right Cleanser: Soap and Water Every Other Day/30 Days Discharge Instructions: May shower and wash wound with dial antibacterial soap and water prior to dressing change. Peri-Wound Care: Ketoconazole Cream 2% Every Other Day/30 Days Discharge Instructions: Apply Ketoconazole as directed Peri-Wound Care: Triamcinolone 15 (g) Every Other Day/30 Days Discharge Instructions: Use triamcinolone 15 (g) as directed Prim Dressing: Hydrofera Blue Classic Foam, 4x4 in Every  Other Day/30 Days ary Discharge Instructions: Moisten with saline prior to applying to wound bed Secondary Dressing: Woven Gauze Sponge, Non-Sterile 4x4 in Every Other Day/30 Days Discharge Instructions: Apply over primary dressing as directed. Secured With: [employer], 4.5x3.1 (in/yd) Every Other Day/30 Days Discharge Instructions: Secure with Kerlix as directed. Secured With: 46M Medipore H Soft Cloth Surgical T ape, 4 x 10 (in/yd) (Generic) Every Other Day/30 Days Discharge Instructions: Secure with tape as directed. WOUND #61: - Lower Leg Wound Laterality: Right, Medial Cleanser: Soap and Water Every Other Day/30 Days Discharge Instructions: May shower and wash wound with dial antibacterial soap and water prior to dressing change. Peri-Wound Care: Ketoconazole Cream 2% Every Other Day/30 Days Discharge  Instructions: Apply Ketoconazole as directed Peri-Wound Care: Triamcinolone 15 (g) Every Other Day/30 Days Discharge Instructions: Use triamcinolone 15 (g) as directed Prim Dressing: Hydrofera Blue Classic Foam, 4x4 in Every Other Day/30 Days ary Discharge Instructions: Moisten with saline prior to applying to wound bed Secondary Dressing: Woven Gauze Sponge, Non-Sterile 4x4 in Every Other Day/30 Days Discharge Instructions: Apply over primary dressing as directed. Secured With: [employer], 4.5x3.1 (in/yd) Every Other Day/30 Days Discharge Instructions: Secure with Kerlix as directed. Secured With: 46M Medipore H Soft Cloth Surgical T ape, 4 x 10 (in/yd) (Generic) Every Other Day/30 Days Discharge Instructions: Secure with tape as directed. #1 thing here is really improved. His left buttock cheek Kintz flaky dry skin extending into the left posterior thigh new open areas are apparent. I tried to use ketoconazole on this is not really affecting. I am uncertain about what is causing this degree of epithelial loss and inflammation. I biopsieed this previously there is no malignancy but otherwise this wasn't helpful. About a year ago I sent him to dermatology in [place] he lives this did not really help. #2 he has chronic problems with wounds on his dorsal right and left feet with dry flaking skin and irritation. I then uncertain about this as well. He has a history of chronic venous insufficiency and this could be stasis dermatitis. The wounds on the left dorsal foot I debrided. I used the #5 curet his right foot actually looks better. 3 his original wound was a venous insufficiency wound on the left lateral lower leg I believe he had a reopening and then we've been dealing with recurrent collection of wounds ever since with variable degrees of improvement and worsening. #4 today I'm going to provide Lotrisone to his bilateral feet areas with Hydrofera Blue still on the  wounds. Continuing with ketoconazole and the buttock although this does not seem to be working. Electronic Signature(s) Signed: 01/12/2022 4:44:59 PM By: Linton Ham MD Entered By: Linton Ham on 01/12/2022 10:02:09 -------------------------------------------------------------------------------- SuperBill Details Patient Name: Date of Service: [place], [name] 01/12/2022 Medical Record Number: [id number] Patient Account Number: [id number] Date of Birth/Sex: Treating RN: June 08, 1986 (36 y.o. 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 with other 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, 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 Non-pressure chronic ulcer of  other part of left foot limited to breakdown of skin L03.115 Cellulitis of right lower limb Facility Procedures CPT4 Code: [id number] Description: 72094 - DEB SUBQ TISSUE 20 SQ CM/< ICD-10 Diagnosis Description L97.521 Non-pressure chronic ulcer of other part of left foot limited to breakdown of Modifier: skin Quantity: 1 Physician Procedures : CPT4 Code Description Modifier [id number] 11042 - WC PHYS SUBQ TISS 20 SQ CM ICD-10 Diagnosis Description L97.521 Non-pressure chronic ulcer of other part of left foot limited to breakdown of skin Quantity: 1 Electronic Signature(s) Signed: 01/12/2022 4:44:59 PM By: Linton Ham MD Entered By: Linton Ham on 01/12/2022 10:04:05

## 2022-01-13 NOTE — Progress Notes (Signed)
[name], [name] ([id number]) Visit Report for 01/12/2022 Arrival Information Details Patient Name: Date of Service: [name], [name] 01/12/2022 8:00 [name] Medical Record Number: [id number] Patient Account Number: [id number] Date of Birth/Sex: Treating RN: 1985/12/14 (36 y.o. Collene Gobble Primary Care Tenaya Hilyer: [place], [place] Other Clinician: Referring Vessie Olmsted: Treating Kyley Laurel/Extender: [name]: 13 Visit Information History Since Last Visit Added or deleted any medications: No Patient Arrived: Wheel Chair Any new allergies or adverse reactions: No Arrival Time: 08:15 Had [name] fall or experienced change in No Accompanied By: self activities of daily living that may affect Transfer Assistance: None risk of falls: Patient Requires Transmission-Based Precautions: No Signs or symptoms of abuse/neglect since last visito No Patient Has Alerts: Yes Hospitalized since last visit: No Patient Alerts: R ABI = 1.0 Implantable device outside of the clinic excluding No L ABI = 1.1 cellular tissue based products placed in the center since last visit: Has Dressing in Place as Prescribed: Yes Pain Present Now: No Electronic Signature(s) Signed: 01/12/2022 12:34:06 PM By: Dellie Catholic RN Entered By: Dellie Catholic on 01/12/2022 08:16:18 -------------------------------------------------------------------------------- Encounter Discharge Information Details Patient Name: Date of Service: [name], [name] 01/12/2022 8:00 [name] Medical Record Number: [id number] Patient Account Number: [id number] Date of Birth/Sex: Treating RN: 1986-11-22 (36 y.o. Collene Gobble Primary Care Deetta Siegmann: [employer], [place] Other Clinician: Referring Jylian Pappalardo: Treating Laithan Conchas/Extender: [name]: 314 Encounter Discharge Information Items Post Procedure Vitals Discharge Condition: Stable Temperature (F): 98.5 Ambulatory Status:  Wheelchair Pulse (bpm): 121 Discharge Destination: Home Respiratory Rate (breaths/min): 18 Transportation: Private Auto Blood Pressure (mmHg): 133/84 Accompanied By: self Schedule Follow-up Appointment: Yes Clinical Summary of Care: Patient Declined Electronic Signature(s) Signed: 01/12/2022 12:34:06 PM By: Dellie Catholic RN Entered By: Dellie Catholic on 01/12/2022 12:33:25 -------------------------------------------------------------------------------- Lower Extremity Assessment Details Patient Name: Date of Service: [name], [name] 01/12/2022 8:00 [name] Medical Record Number: [id number] Patient Account Number: [id number] Date of Birth/Sex: Treating RN: 02-Sep-1986 (36 y.o. Collene Gobble Primary Care Kearney Evitt: [place], [place] Other Clinician: Referring Eliany Mccarter: Treating Jamin Panther/Extender: [name]: 314 Edema Assessment Assessed: [Left: No] [Right: No] Edema: [Left: Yes] [Right: Yes] Calf Left: Right: Point of Measurement: 33 cm From Medial Instep 28.1 cm 35 cm Ankle Left: Right: Point of Measurement: 10 cm From Medial Instep 25.1 cm 24.2 cm Electronic Signature(s) Signed: 01/12/2022 12:34:06 PM By: Dellie Catholic RN Entered By: Dellie Catholic on 01/12/2022 08:34:23 -------------------------------------------------------------------------------- Multi-Disciplinary Care Plan Details Patient Name: Date of Service: [name], [name] 01/12/2022 8:00 [name] Medical Record Number: [id number] Patient Account Number: [id number] Date of Birth/Sex: Treating RN: [birth year] (36 y.o. Collene Gobble Primary Care Charlotte Fidalgo: [name], [name] Other Clinician: Referring Armondo Cech: Treating Amandy Chubbuck/Extender: [name]: [place] reviewed with physician Active Inactive Wound/Skin Impairment Nursing Diagnoses: Impaired tissue integrity Knowledge deficit related to ulceration/compromised  skin integrity Goals: Patient/caregiver will verbalize understanding of skin care regimen Date Initiated: 01/05/2016 Target Resolution Date: 01/23/2022 Goal Status: Active Ulcer/skin breakdown will have [name] volume reduction of 30% by week 4 Date Initiated: 01/05/2016 Date Inactivated: 12/22/2017 Target Resolution Date: 01/19/2018 Unmet Reason: complex wounds, Goal Status: Unmet infection Interventions: Assess patient/caregiver ability to obtain necessary supplies Assess ulceration(s) every visit Provide education on ulcer and skin care Notes: 02/02/21: Complex Care, ongoing. Electronic Signature(s) Signed: 01/12/2022 12:29:35 PM By: Dellie Catholic RN Entered By: Dellie Catholic on 01/12/2022 12:29:34 -------------------------------------------------------------------------------- Pain Assessment Details  Patient Name: Date of Service: [name], [name] 01/12/2022 8:00 [name] Medical Record Number: [id number] Patient Account Number: [id number] Date of Birth/Sex: Treating RN: June 19, 1986 (36 y.o. Collene Gobble Primary Care Taila Basinski: [place], [place] Other Clinician: Referring Loriana Samad: Treating Midge Momon/Extender: [name]: 314 Active Problems Location of Pain Severity and Description of Pain Patient Has Paino No Site Locations Pain Management and Medication Current Pain Management: Electronic Signature(s) Signed: 01/12/2022 12:34:06 PM By: Dellie Catholic RN Entered By: Dellie Catholic on 01/12/2022 08:16:57 -------------------------------------------------------------------------------- Patient/Caregiver Education Details Patient Name: Date of Service: [name], [name] 2/21/2023andnbsp8:00 [name] Medical Record Number: [id number] Patient Account Number: [id number] Date of Birth/Gender: Treating RN: 1986/07/31 (36 y.o. Collene Gobble Primary Care Physician: Janine Limbo Other Clinician: Referring Physician: Treating Physician/Extender:  [name]: 1 Education Assessment Education Provided To: Patient Education Topics Provided Wound/Skin Impairment: Methods: Explain/Verbal Responses: Return demonstration correctly Electronic Signature(s) Signed: 01/12/2022 12:34:06 PM By: Dellie Catholic RN Entered By: Dellie Catholic on 01/12/2022 12:30:18 -------------------------------------------------------------------------------- Wound Assessment Details Patient Name: Date of Service: [name], [name] 01/12/2022 8:00 [name] Medical Record Number: [id number] Patient Account Number: [id number] Date of Birth/Sex: Treating RN: [birth year] (36 y.o. Collene Gobble Primary Care Raul Torrance: [name], [name] Other Clinician: Referring Joyanna Kleman: Treating Jeraldine Primeau/Extender: [name]: 314 Wound Status Wound Number: [id number] Primary Etiology: Pressure Ulcer Wound Location: Left Ischium Wound Status: Open Wounding Event: Gradually Appeared Comorbid History: Sleep Apnea, Hypertension, Paraplegia Date Acquired: 03/16/2020 Weeks Of Treatment: 95 Clustered Wound: Yes Photos Wound Measurements Length: (cm) 5.5 Width: (cm) 3.5 Depth: (cm) 0.1 Clustered Quantity: 3 Area: (cm) 15.11 Volume: (cm) 1.512 % Reduction in Area: -737.2% % Reduction in Volume: -735.4% Epithelialization: Medium (34-66%) Tunneling: No 9 Undermining: No Wound Description Classification: Category/Stage II Wound Margin: Distinct, outline attached Exudate Amount: Medium Exudate Type: Serosanguineous Exudate Color: red, brown Foul Odor After Cleansing: No Slough/Fibrino Yes Wound Bed Granulation Amount: Medium (34-66%) Exposed Structure Granulation Quality: Red Fascia Exposed: No Necrotic Amount: Medium (34-66%) Fat Layer (Subcutaneous Tissue) Exposed: No Tendon Exposed: No Muscle Exposed: No Joint Exposed: No Bone Exposed: No Assessment Notes cluster of 3 wound Treatment  Notes Wound #[id number] (Ischium) Wound Laterality: Left Cleanser Soap and Water Discharge Instruction: May shower and wash wound with dial antibacterial soap and water prior to dressing change. Peri-Wound Care Ketoconazole Cream 2% Discharge Instruction: Apply Ketoconazole as directed Triad Hydrophilic Wound Dressing Tube, 6 (oz) Discharge Instruction: Apply to periwound with each dressing change Topical Primary Dressing Hydrofera Blue Classic Foam, 4x4 in Discharge Instruction: Moisten with saline prior to applying to wound bed Secondary Dressing Zetuvit Plus Silicone Border Dressing 5x5 (in/in) Discharge Instruction: Apply silicone border over primary dressing as directed. Secured With 68M Medipore H Soft Cloth Surgical T 4 x 2 (in/yd) ape Discharge Instruction: Secure dressing with tape as directed. Compression Wrap Compression Stockings Add-Ons Electronic Signature(s) Signed: 01/12/2022 12:34:06 PM By: Dellie Catholic RN Signed: 01/13/2022 8:22:14 AM By: Sandre Kitty Entered By: Sandre Kitty on 01/12/2022 08:55:15 -------------------------------------------------------------------------------- Wound Assessment Details Patient Name: Date of Service: [name], [name] 01/12/2022 8:00 [name] Medical Record Number: [id number] Patient Account Number: [id number] Date of Birth/Sex: Treating RN: Jan 16, 1986 (36 y.o. Collene Gobble Primary Care Ishita Mcnerney: [name], [name] Other Clinician: Referring Charvi Gammage: Treating Danarius Mcconathy/Extender: [name]: 314 Wound Status Wound Number: 52 Primary Etiology: Trauma, Other Wound Location: Right, Dorsal Foot Wound Status: Open Wounding Event:  Gradually Appeared Comorbid History: Sleep Apnea, Hypertension, Paraplegia Date Acquired: 03/27/2021 Weeks Of Treatment: 41 Clustered Wound: No Photos Wound Measurements Length: (cm) 0.5 Width: (cm) 2 Depth: (cm) 0.1 Area: (cm) 0.785 Volume: (cm) 0.079 %  Reduction in Area: 58.4% % Reduction in Volume: 58% Epithelialization: Small (1-33%) Tunneling: No Undermining: No Wound Description Classification: Full Thickness Without Exposed Support Structures Wound Margin: Distinct, outline attached Exudate Amount: Medium Exudate Type: Serosanguineous Exudate Color: red, brown Foul Odor After Cleansing: No Slough/Fibrino Yes Wound Bed Granulation Amount: Medium (34-66%) Exposed Structure Granulation Quality: Pink, Pale Fascia Exposed: No Necrotic Amount: Medium (34-66%) Fat Layer (Subcutaneous Tissue) Exposed: Yes Necrotic Quality: Adherent Slough Tendon Exposed: No Muscle Exposed: No Joint Exposed: No Bone Exposed: No Treatment Notes Wound #52 (Foot) Wound Laterality: Dorsal, Right Cleanser Soap and Water Discharge Instruction: May shower and wash wound with dial antibacterial soap and water prior to dressing change. Peri-Wound Care Ketoconazole Cream 2% Discharge Instruction: Apply Ketoconazole as directed Triamcinolone 15 (g) Discharge Instruction: Use triamcinolone 15 (g) as directed Topical Primary Dressing Hydrofera Blue Classic Foam, 4x4 in Discharge Instruction: Moisten with saline prior to applying to wound bed Secondary Dressing Woven Gauze Sponge, Non-Sterile 4x4 in Discharge Instruction: Apply over primary dressing as directed. Secured With [employer], 4.5x3.1 (in/yd) Discharge Instruction: Secure with Kerlix as directed. 342M Medipore H Soft Cloth Surgical T ape, 4 x 10 (in/yd) Discharge Instruction: Secure with tape as directed. Compression Wrap Compression Stockings Add-Ons Electronic Signature(s) Signed: 01/12/2022 12:34:06 PM By: Dellie Catholic RN Signed: 01/13/2022 8:22:14 AM By: Sandre Kitty Entered By: Sandre Kitty on 01/12/2022 08:48:03 -------------------------------------------------------------------------------- Wound Assessment Details Patient Name: Date of Service: [name], [name] LEX  E. 01/12/2022 8:00 [name] Medical Record Number: [id number] Patient Account Number: [id number] Date of Birth/Sex: Treating RN: 1986-08-17 (36 y.o. Collene Gobble Primary Care Brenee Gajda: [name] Other Clinician: Referring Annalise Mcdiarmid: Treating Prathik Aman/Extender: [name]: 314 Wound Status Wound Number: 56 Primary Etiology: Neuropathic Ulcer-Non Diabetic Wound Location: Left, Dorsal Foot Wound Status: Open Wounding Event: Gradually Appeared Comorbid History: Sleep Apnea, Hypertension, Paraplegia Date Acquired: 07/11/2021 Weeks Of Treatment: 25 Clustered Wound: No Photos Wound Measurements Length: (cm) 1.8 Width: (cm) 1 Depth: (cm) 0.1 Area: (cm) 1.414 Volume: (cm) 0.141 % Reduction in Area: 77.3% % Reduction in Volume: 77.3% Epithelialization: Small (1-33%) Tunneling: No Undermining: No Wound Description Classification: Full Thickness Without Exposed Support Structures Wound Margin: Distinct, outline attached Exudate Amount: Medium Exudate Type: Serosanguineous Exudate Color: red, brown Foul Odor After Cleansing: No Slough/Fibrino Yes Wound Bed Granulation Amount: Medium (34-66%) Exposed Structure Granulation Quality: Pink, Pale Fascia Exposed: No Necrotic Amount: Medium (34-66%) Fat Layer (Subcutaneous Tissue) Exposed: Yes Necrotic Quality: Adherent Slough Tendon Exposed: No Muscle Exposed: No Joint Exposed: No Bone Exposed: No Treatment Notes Wound #56 (Foot) Wound Laterality: Dorsal, Left Cleanser Soap and Water Discharge Instruction: May shower and wash wound with dial antibacterial soap and water prior to dressing change. Peri-Wound Care Ketoconazole Cream 2% Discharge Instruction: Apply Ketoconazole as directed Triamcinolone 15 (g) Discharge Instruction: Use triamcinolone 15 (g) as directed Topical Primary Dressing Hydrofera Blue Classic Foam, 4x4 in Discharge Instruction: Moisten with saline prior to  applying to wound bed Secondary Dressing Woven Gauze Sponge, Non-Sterile 4x4 in Discharge Instruction: Apply over primary dressing as directed. Secured With [employer], 4.5x3.1 (in/yd) Discharge Instruction: Secure with Kerlix as directed. 342M Medipore H Soft Cloth Surgical T ape, 4 x 10 (in/yd) Discharge Instruction: Secure with tape as directed. Compression  Wrap Compression Stockings Add-Ons Electronic Signature(s) Signed: 01/12/2022 12:34:06 PM By: Dellie Catholic RN Signed: 01/13/2022 8:22:14 AM By: Sandre Kitty Entered By: Sandre Kitty on 01/12/2022 08:49:15 -------------------------------------------------------------------------------- Wound Assessment Details Patient Name: Date of Service: [name], [name] 01/12/2022 8:00 [name] Medical Record Number: [id number] Patient Account Number: [id number] Date of Birth/Sex: Treating RN: 1986/03/13 (36 y.o. Collene Gobble Primary Care Treesa Mccully: [name], [name] Other Clinician: Referring Anira Senegal: Treating Karl Knarr/Extender: [name]: 314 Wound Status Wound Number: 57 Primary Etiology: Neuropathic Ulcer-Non Diabetic Wound Location: Right, Plantar Foot Wound Status: Open Wounding Event: Gradually Appeared Comorbid History: Sleep Apnea, Hypertension, Paraplegia Date Acquired: 08/25/2021 Weeks Of Treatment: 20 Clustered Wound: No Photos Wound Measurements Length: (cm) 0.3 Width: (cm) 0.3 Depth: (cm) 0.1 Area: (cm) 0.071 Volume: (cm) 0.007 % Reduction in Area: 93% % Reduction in Volume: 93.1% Epithelialization: Large (67-100%) Tunneling: No Undermining: No Wound Description Classification: Full Thickness Without Exposed Support Structures Wound Margin: Distinct, outline attached Exudate Amount: Medium Exudate Type: Serous Exudate Color: amber Foul Odor After Cleansing: No Slough/Fibrino Yes Wound Bed Granulation Amount: Medium (34-66%) Exposed  Structure Granulation Quality: Pink Fascia Exposed: No Necrotic Amount: Medium (34-66%) Fat Layer (Subcutaneous Tissue) Exposed: Yes Necrotic Quality: Adherent Slough Tendon Exposed: No Muscle Exposed: No Joint Exposed: No Bone Exposed: No Treatment Notes Wound #57 (Foot) Wound Laterality: Plantar, Right Cleanser Soap and Water Discharge Instruction: May shower and wash wound with dial antibacterial soap and water prior to dressing change. Peri-Wound Care Ketoconazole Cream 2% Discharge Instruction: Apply Ketoconazole as directed Triamcinolone 15 (g) Discharge Instruction: Use triamcinolone 15 (g) as directed Topical Primary Dressing Hydrofera Blue Classic Foam, 4x4 in Discharge Instruction: Moisten with saline prior to applying to wound bed Secondary Dressing Woven Gauze Sponge, Non-Sterile 4x4 in Discharge Instruction: Apply over primary dressing as directed. Secured With [employer], 4.5x3.1 (in/yd) Discharge Instruction: Secure with Kerlix as directed. 72M Medipore H Soft Cloth Surgical T ape, 4 x 10 (in/yd) Discharge Instruction: Secure with tape as directed. Compression Wrap Compression Stockings Add-Ons Electronic Signature(s) Signed: 01/12/2022 12:34:06 PM By: Dellie Catholic RN Signed: 01/13/2022 8:22:14 AM By: Sandre Kitty Entered By: Sandre Kitty on 01/12/2022 08:46:29 -------------------------------------------------------------------------------- Wound Assessment Details Patient Name: Date of Service: [name], [name] 01/12/2022 8:00 [name] Medical Record Number: [id number] Patient Account Number: [id number] Date of Birth/Sex: Treating RN: 1986/02/15 (36 y.o. Collene Gobble Primary Care Lillyn Wieczorek: Other Clinician: Janine Limbo Referring Caterine Mcmeans: Treating Tammra Pressman/Extender: [name]: 314 Wound Status Wound Number: 61 Primary Etiology: T be determined o Wound Location: Right, Medial Lower  Leg Wound Status: Open Wounding Event: Gradually Appeared Comorbid History: Sleep Apnea, Hypertension, Paraplegia Date Acquired: 01/10/2022 Weeks Of Treatment: 0 Clustered Wound: No Photos Wound Measurements Length: (cm) 1 Width: (cm) 0.8 Depth: (cm) 0.1 Area: (cm) 0.628 Volume: (cm) 0.063 % Reduction in Area: 0% % Reduction in Volume: 0% Epithelialization: None Tunneling: No Undermining: No Wound Description Classification: Full Thickness Without Exposed Support Structures Exudate Amount: Medium Exudate Type: Serosanguineous Exudate Color: red, brown Foul Odor After Cleansing: No Slough/Fibrino Yes Wound Bed Granulation Amount: Medium (34-66%) Exposed Structure Granulation Quality: Pink Fascia Exposed: No Necrotic Amount: Medium (34-66%) Fat Layer (Subcutaneous Tissue) Exposed: Yes Necrotic Quality: Adherent Slough Tendon Exposed: No Muscle Exposed: No Joint Exposed: No Bone Exposed: No Electronic Signature(s) Signed: 01/12/2022 12:34:06 PM By: Dellie Catholic RN Entered By: Dellie Catholic on 01/12/2022 09:22:53 -------------------------------------------------------------------------------- Wound Assessment Details Patient Name: Date of Service: [place],  [name] 01/12/2022 8:00 [name] Medical Record Number: [id number] Patient Account Number: [id number] Date of Birth/Sex: Treating RN: Oct 27, 1986 (36 y.o. Collene Gobble Primary Care Sultana Tierney: [name] Other Clinician: Referring Manpreet Kemmer: Treating Nirvi Boehler/Extender: [name]: 314 Wound Status Wound Number: 61 Primary Etiology: T be determined o Wound Location: Right, Medial Lower Leg Wound Status: Open Wounding Event: Gradually Appeared Comorbid History: Sleep Apnea, Hypertension, Paraplegia Date Acquired: 01/10/2022 Weeks Of Treatment: 0 Clustered Wound: No Wound Measurements Length: (cm) 1 Width: (cm) 0.8 Depth: (cm) 0.1 Area: (cm) 0.628 Volume: (cm)  0.063 % Reduction in Area: 0% % Reduction in Volume: 0% Epithelialization: None Tunneling: No Undermining: No Wound Description Classification: Full Thickness Without Exposed Support Structures Exudate Amount: Medium Exudate Type: Serosanguineous Exudate Color: red, brown Foul Odor After Cleansing: No Slough/Fibrino Yes Wound Bed Granulation Amount: Medium (34-66%) Exposed Structure Granulation Quality: Pink Fascia Exposed: No Necrotic Amount: Medium (34-66%) Fat Layer (Subcutaneous Tissue) Exposed: Yes Necrotic Quality: Adherent Slough Tendon Exposed: No Muscle Exposed: No Joint Exposed: No Bone Exposed: No Treatment Notes Wound #61 (Lower Leg) Wound Laterality: Right, Medial Cleanser Soap and Water Discharge Instruction: May shower and wash wound with dial antibacterial soap and water prior to dressing change. Peri-Wound Care Ketoconazole Cream 2% Discharge Instruction: Apply Ketoconazole as directed Triamcinolone 15 (g) Discharge Instruction: Use triamcinolone 15 (g) as directed Topical Primary Dressing Hydrofera Blue Classic Foam, 4x4 in Discharge Instruction: Moisten with saline prior to applying to wound bed Secondary Dressing Woven Gauze Sponge, Non-Sterile 4x4 in Discharge Instruction: Apply over primary dressing as directed. Secured With [employer], 4.5x3.1 (in/yd) Discharge Instruction: Secure with Kerlix as directed. 62M Medipore H Soft Cloth Surgical T ape, 4 x 10 (in/yd) Discharge Instruction: Secure with tape as directed. Compression Wrap Compression Stockings Add-Ons Electronic Signature(s) Signed: 01/12/2022 12:34:06 PM By: Dellie Catholic RN Entered By: Dellie Catholic on 01/12/2022 09:23:47 -------------------------------------------------------------------------------- Vitals Details Patient Name: Date of Service: [place]ib[place]itts, [name] 01/12/2022 8:00 [name] Medical Record Number: [id number] Patient Account Number: [id number] Date of  Birth/Sex: Treating RN: 11-22-1986 (36 y.o. Collene Gobble Primary Care Jamisen Duerson: [place], [place] Other Clinician: Referring Pharell Rolfson: Treating Marlen Koman/Extender: [name]: 314 Vital Signs Time Taken: 08:15 Temperature (F): 98.5 Height (in): 70 Pulse (bpm): 121 Weight (lbs): 216 Respiratory Rate (breaths/min): 18 Body Mass Index (BMI): 31 Blood Pressure (mmHg): 133/84 Reference Range: 80 - 120 mg / dl Electronic Signature(s) Signed: 01/12/2022 12:34:06 PM By: Dellie Catholic RN Entered By: Dellie Catholic on 01/12/2022 08:16:46

## 2022-01-26 ENCOUNTER — Encounter (HOSPITAL_BASED_OUTPATIENT_CLINIC_OR_DEPARTMENT_OTHER): Payer: BC Managed Care – PPO | Attending: General Surgery | Admitting: General Surgery

## 2022-01-26 ENCOUNTER — Other Ambulatory Visit: Payer: Self-pay

## 2022-01-26 DIAGNOSIS — L97821 Non-pressure chronic ulcer of other part of left lower leg limited to breakdown of skin: Secondary | ICD-10-CM | POA: Diagnosis not present

## 2022-01-26 DIAGNOSIS — L84 Corns and callosities: Secondary | ICD-10-CM | POA: Diagnosis not present

## 2022-01-26 DIAGNOSIS — I87332 Chronic venous hypertension (idiopathic) with ulcer and inflammation of left lower extremity: Secondary | ICD-10-CM | POA: Diagnosis not present

## 2022-01-26 DIAGNOSIS — L97521 Non-pressure chronic ulcer of other part of left foot limited to breakdown of skin: Secondary | ICD-10-CM | POA: Insufficient documentation

## 2022-01-26 DIAGNOSIS — L89323 Pressure ulcer of left buttock, stage 3: Secondary | ICD-10-CM | POA: Diagnosis not present

## 2022-01-26 DIAGNOSIS — L97511 Non-pressure chronic ulcer of other part of right foot limited to breakdown of skin: Secondary | ICD-10-CM | POA: Diagnosis not present

## 2022-01-26 DIAGNOSIS — L03116 Cellulitis of left lower limb: Secondary | ICD-10-CM | POA: Diagnosis not present

## 2022-01-26 DIAGNOSIS — L97518 Non-pressure chronic ulcer of other part of right foot with other specified severity: Secondary | ICD-10-CM | POA: Insufficient documentation

## 2022-01-26 DIAGNOSIS — G8221 Paraplegia, complete: Secondary | ICD-10-CM | POA: Diagnosis not present

## 2022-01-26 NOTE — Progress Notes (Signed)
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 erythema is absolutely no better there is warmth and swelling both medially from the left lateral leg wound and also the dorsal left foot. 01/06/17- Patient is here for follow-up evaluation of his left lateral leg ulcer and bilateral feet ulcers. He is on oral antibiotic therapy, tolerating that. Nursing staff and the patient states that the erythema is improved from Monday. 01/13/17; the predominant left lateral leg wound continues to be problematic. I had put Apligraf on him earlier this month once. However he subsequently developed what appeared to be an intense cellulitis around the left lateral leg wound. I gave him Dalvance I think on 2/12 perhaps 2/13 he continues on cefdinir. The erythema is still present but the warmth and swelling is improved. I am hopeful that the cellulitis part of this control. I wouldn't be surprised if there is an element of venous inflammation as well. 01/17/17. The erythema is present but better in the left leg. His left lateral leg wound still does not have [name] viable surface buttons certain parts of this long thin wound it appears like there has been improvement in dimensions. 01/20/17; the erythema still present but much better in the left leg. I'm thinking this is his usual degree of chronic venous  inflammation. The wound on the left leg looks somewhat better. Is less surface slough 01/27/17; erythema is back to the chronic venous inflammation. The wound on the left leg is somewhat better. I am back to the point where I like to try an Apligraf once again 02/10/17; slight improvement in wound dimensions. Apligraf #2. He is completing his doxycycline 02/14/17; patient arrives today having completed doxycycline last Thursday. This was supposed to be [name] nurse visit however once again he hasn't tense erythema from the medial part of his wound extending over the lower leg. Also erythema in his foot this is roughly in the same distribution as last time. He has baseline chronic venous inflammation however this is [name] lot worse than the baseline I have learned to accept the on him is baseline inflammation 02/24/17- patient is here for follow-up evaluation. He is tolerating compression therapy. His voicing no complaints or concerns he is here anticipating an Apligraf 03/03/17; he arrives today with an adherent necrotic surface. I don't think this is surface is going to be amenable for Apligraf's. The erythema around his wound and on the left dorsal foot has resolved he is off antibiotics 03/10/17; better-looking surface today. I don't think he can tolerate Apligraf's. He tells me he had [name] wound VAC after [name] skin graft years ago to this area and they had difficulty with [name] seal. The erythema  continues to be stable around this some degree of chronic venous inflammation but he also has recurrent cellulitis. We have been using Iodoflex 03/17/17; continued improvement in the surface and may be small changes in dimensions. Using Iodoflex which seems the only thing that will control his surface 03/24/17- He is here for follow up evaluation of his LLE lateral ulceration and ulcer to right dorsal foot/toe space. He is voicing no complaints or concerns, He is tolerating compression wrap. 03/31/17 arrives today with [name] much  healthier looking wound on the left lower extremity. We have been using Iodoflex for [name] prolonged period of time which has for the first time prepared and adequate looking wound bed although we have not had much in the way of wound dimension improvement. He also has [name] small wound between the first and second toe on the right 04/07/17; arrives today with [name] healthy-looking wound bed and at least the top 50% of this wound appears to be now her. No debridement was required I have changed him to [facility] last week after prolonged Iodoflex. He did not do well with Apligraf's. We've had [name] re-opening between the first and second toe on the right 04/14/17; arrives today with [name] healthier looking wound bed contractions and the top 50% of this wound and some on the lesser 50%. Wound bed appears healthy. The area between the first and second toe on the right still remains problematic 04/21/17; continued very gradual improvement. Using [facility] 04/28/17; continued very gradual improvement in the left lateral leg venous insufficiency wound. His periwound erythema is very mild. We have been using Hydrofera Blue. Wound is making progress especially in the superior 50% 05/05/17; he continues to have very gradual improvement in the left lateral venous insufficiency wound. Both in terms with an length rings are improving. I debrided this every 2 weeks with #5 curet and we have been using Hydrofera Blue and again making good progress With regards to the wounds between his right first and second toe which I thought might of been tinea pedis he is not making as much progress very dry scaly skin over the area. Also the area at the base of the left first and second toe in [name] similar condition 05/12/17; continued gradual improvement in the refractory left lateral venous insufficiency wound on the left. Dimension smaller. Surface still requiring debridement using Hydrofera Blue 05/19/17; continued gradual improvement in the  refractory left lateral venous ulceration. Careful inspection of the wound bed underlying rumination suggested some degree of epithelialization over the surface no debridement indicated. Continue Hydrofera Blue difficult areas between his toes first and third on the left than first and second on the right. I'm going to change to silver alginate from silver collagen. Continue ketoconazole as I suspect underlying tinea pedis 05/26/17; left lateral leg venous insufficiency wound. We've been using Hydrofera Blue. I believe that there is expanding epithelialization over the surface of the wound albeit not coming from the wound circumference. This is [name] bit of an odd situation in which the epithelialization seems to be coming from the surface of the wound rather than in the exact circumference. There is still small open areas mostly along the lateral margin of the wound. He has unchanged areas between the left first and second and the right first second toes which I been treating for tenia pedis 06/02/17; left lateral leg venous insufficiency wound. We have been using Hydrofera Blue. Somewhat smaller from the wound circumference. The surface of the wound remains [name]  bit on it almost epithelialized sedation in appearance. I use an open curette today debridement in the surface of all of this especially the edges Small open wounds remaining on the dorsal right first and second toe interspace and the plantar left first second toe and her face on the left 06/09/17; wound on the left lateral leg continues to be smaller but very gradual and very dry surface using Hydrofera Blue 06/16/17 requires weekly debridements now on the left lateral leg although this continues to contract. I changed to silver collagen last week because of dryness of the wound bed. Using Iodoflex to the areas on his first and second toes/web space bilaterally 06/24/17; patient with history of paraplegia also chronic venous insufficiency with lymphedema.  Has [name] very difficult wound on the left lateral leg. This has been gradually reducing in terms of with but comes in with [name] very dry adherent surface. High switch to silver collagen [name] week or so ago with hydrogel to keep the area moist. This is been refractory to multiple dressing attempts. He also has areas in his first and second toes bilaterally in the anterior and posterior web space. I had been using Iodoflex here after [name] prolonged course of silver alginate with ketoconazole was ineffective [question tinea pedis] 07/14/17; patient arrives today with [name] very difficult adherent material over his left lateral lower leg wound. He also has surrounding erythema and poorly controlled edema. He was switched his Santyl last visit which the nurses are applying once during his doctor visit and once on [name] nurse visit. He was also reduced to 2 layer compression I'm not exactly sure of the issue here. 07/21/17; better surface today after 1 week of Iodoflex. Significant cellulitis that we treated last week also better. [Doxycycline] 07/28/17 better surface today with now 2 weeks of Iodoflex. Significant cellulitis treated with doxycycline. He has now completed the doxycycline and he is back to his usual degree of chronic venous inflammation/stasis dermatitis. He reminds me he has had ablations surgery here 08/04/17; continued improvement with Iodoflex to the left lateral leg wound in terms of the surface of the wound although the dimensions are better. 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 Problematic areas on the plantar aspect of the first second toe web space on the left and the dorsal aspect of the first second toe web space on the right. At one point I felt these were probably related to chronic fungal infections in treated him aggressively for this although we have not made any improvement here. 08/11/17; left lateral leg. Surface continues  to improve with the Iodoflex although we are not seeing much improvement in overall wound dimensions. Areas on his plantar left foot and right foot show no improvement. In fact the right foot looks somewhat worse 08/18/17; left lateral leg. We changed to [facility] Blue last week after [name] prolonged course of Iodoflex which helps get the surface better. It appears that the wound with is improved. Continue with difficult areas on the left dorsal first second and plantar first second on the right 09/01/17; patient arrives in clinic today having had [name] temperature of 103 yesterday. He was seen in the ER and [facility]. The patient was concerned he could have cellulitis again in the right leg however they diagnosed him with [name] UTI and he is now on Keflex. He has [name] history of cellulitis which is been recurrent and difficult but this is been in the left  leg, in the past 5 use doxycycline. He does in and out catheterizations at home which are risk factors for UTI 09/08/17; patient will be completing his Keflex this weekend. The erythema on the left leg is considerably better. He has [name] new wound today on the medial part of the right leg small superficial almost looks like [name] skin tear. He has worsening of the area on the right dorsal first and second toe. His major area on the left lateral leg is better. Using Hydrofera Blue on all areas 09/15/17; gradual reduction in width on the long wound in the left lateral leg. No debridement required. He also has wounds on the plantar aspect of his left first second toe web space and on the dorsal aspect of the right first second toe web space. 09/22/17; there continues to be very gradual improvements in the dimensions of the left lateral leg wound. He hasn't round erythematous spot with might be pressure on his wheelchair. There is no evidence obviously of infection no purulence no warmth He has [name] dry scaled area on the plantar aspect of the left first second toe Improved area  on the dorsal right first second toe. 09/29/17; left lateral leg wound continues to improve in dimensions mostly with an is still [name] fairly long but increasingly narrow wound. He has [name] dry scaled area on the plantar aspect of his left first second toe web space Increasingly concerning area on the dorsal right first second toe. In fact I am concerned today about possible cellulitis around this wound. The areas extending up his second toe and although there is deformities here almost appears to abut on the nailbed. 10/06/17; left lateral leg wound continues to make very gradual progress. Tissue culture I did from the right first second toe dorsal foot last time grew MRSA and enterococcus which was vancomycin sensitive. This was not sensitive to clindamycin or doxycycline. He is allergic to Zyvox and sulfa we have therefore arrange for him to have dalvance infusion tomorrow. He is had this in the past and tolerated it well 10/20/17; left lateral leg wound continues to make decent progress. This is certainly reduced in terms of with there is advancing epithelialization.The cellulitis in the right foot looks better although he still has [name] deep wound in the dorsal aspect of the first second toe web space. Plantar left first toe web space on the left I think is making some progress 10/27/17; left lateral leg wound continues to make decent progress. Advancing epithelialization.using Hydrofera Blue The right first second toe web space wound is better-looking using silver alginate Improvement in the left plantar first second toe web space. Again using silver alginate 11/03/17 left lateral leg wound continues to make decent progress albeit slowly. Using [facility] The right per second toe web space continues to be [name] very problematic looking punched out wound. I obtained [name] piece of tissue for deep culture I did extensively treated this for fungus. It is difficult to imagine that this is [name] pressure area as the  patient states other than going outside he doesn't really wear shoes at home The left plantar first second toe web space looked fairly senescent. Necrotic edges. This required debridement change to [facility] Blue to all wound areas 11/10/17; left lateral leg wound continues to contract. Using Hydrofera Blue On the right dorsal first second toe web space dorsally. Culture I did of this area last week grew MRSA there is not an easy oral option in this patient was multiple  antibiotic allergies or intolerances. This was only [name] rare culture isolate I'm therefore going to use Bactroban under silver alginate On the left plantar first second toe web space. Debridement is required here. This is also unchanged 11/17/17; left lateral leg wound continues to contract using Hydrofera Blue this is no longer the major issue. The major concern here is the right first second toe web space. He now has an open area going from dorsally to the plantar aspect. There is now wound on the inner lateral part of the first toe. Not [name] very viable surface on this. There is erythema spreading medially into the forefoot. No major change in the left first second toe plantar wound 11/24/17; left lateral leg wound continues to contract using Hydrofera Blue. Nice improvement today The right first second toe web space all of this looks [name] lot less angry than last week. I have given him clindamycin and topical Bactroban for MRSA and terbinafine for the possibility of underlining tinea pedis that I could not control with ketoconazole. Looks somewhat better The area on the plantar left first second toe web space is weeping with dried debris around the wound 12/01/17; left lateral leg wound continues to contract he Hydrofera Blue. It is becoming thinner in terms of with nevertheless it is making good improvement. The right first second toe web space looks less angry but still [name] large necrotic-looking wounds starting on the plantar aspect of the  right foot extending between the toes and now extensively on the base of the right second toe. I gave him clindamycin and topical Bactroban for MRSA anterior benefiting for the possibility of underlying tinea pedis. Not looking better today The area on the left first/second toe looks better. Debrided of necrotic debris 12/05/17* the patient was worked in urgently today because over the weekend he found blood on his incontinence bad when he woke up. He was found to have an ulcer by his wife who does most of his wound care. He came in today for [place] to look at this. He has not had [name] history of wounds in his buttocks in spite of his paraplegia. 12/08/17; seen in follow-up today at his usual appointment. He was seen earlier this week and found to have [name] new wound on his buttock. We also follow him for wounds on the left lateral leg, left first second toe web space and right first second toe web space 12/15/17; we have been using Hydrofera Blue to the left lateral leg which has improved. The right first second toe web space has also improved. Left first second toe web space plantar aspect looks stable. The left buttock has worsened using Santyl. Apparently the buttock has drainage 12/22/17; we have been using Hydrofera Blue to the left lateral leg which continues to improve now 2 small wounds separated by normal skin. He tells [place] he had [name] fever up to 100 yesterday he is prone to UTIs but has not noted anything different. He does in and out catheterizations. The area between the first and second toes today does not look good necrotic surface covered with what looks to be purulent drainage and erythema extending into the third toe. I had gotten this to something that I thought look better last time however it is not look good today. He also has [name] necrotic surface over the buttock wound which is expanded. I thought there might be infection under here so I removed [name] lot of the surface with [name] #5 curet though nothing  look like it really needed culturing. He is been using Santyl to this area 12/27/17; his original wound on the left lateral leg continues to improve using Hydrofera Blue. I gave him samples of Baxdella although he was unable to take them out of fear for an allergic reaction ["lump in his throat"].the culture I did of the purulent drainage from his second toe last week showed both enterococcus and [name] set Enterobacter I was also concerned about the erythema on the bottom of his foot although paradoxically although this looks somewhat better today. 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 We've been using silver alginate here. They came up today that he sleeps in [name] recliner, I'm not sure why but I asked him to stop this 01/03/18; his original wound we've been using Hydrofera Blue is now separated into 2 areas. Ulcer on his left buttock is better he is off the recliner and sleeping in bed Finally both wound areas between his first and second toes also looks some better 01/10/18; his original wound on the left lateral leg is now separated into 2 wounds we've been using Hydrofera Blue Ulcer on his left buttock has some drainage. There is [name] small probing site going into muscle layer superiorly.using silver alginate -He arrives today with [name] deep tissue injury on the left heel The wound on the dorsal aspect of his first second toe on the left looks [name] lot betterusing silver alginate ketoconazole The area on the first second toe web space on the right also looks [name] lot bette 01/17/18; his original wound on the left lateral leg continues to progress using Hydrofera Blue Ulcer on his left buttock also is smaller surface healthier except for [name] small probing site going into the muscle layer superiorly. 2.4 cm of tunneling in this area DTI on his left heel we have only been offloading. Looks better than last week no threatened  open no evidence of infection the wound on the dorsal aspect of the first second toe on the left continues to look like it's regressing we have only been using silver alginate and terbinafine orally The area in the first second toe web space on the right also looks to be [name] lot better using silver alginate and terbinafine I think this was prompted by tinea pedis 01/31/18; the patient was hospitalized in [place] last week apparently for [name] complicated UTI. He was discharged on cefepime he does in and out catheterizations. In the hospital he was discovered M I don't mild elevation of AST and ALT and the terbinafine was stopped.predictably the pressure ulcer on s his buttock looks betterusing silver alginate. The area on the left lateral leg also is better using Hydrofera Blue. The area between the first and second toes on the left better. First and second toes on the right still substantial but better. Finally the DTI on the left heel has held together and looks like it's resolving 02/07/18-he is here in follow-up evaluation for multiple ulcerations. He has new injury to the lateral aspect of the last issue [name] pressure ulcer, he states this is from adhesive removal trauma. He states he has tried multiple adhesive products with no success. All other ulcers appear stable. The left heel DTI is resolving. We will continue with same treatment plan and follow-up next week. 02/14/18; follow-up for multiple areas. He has [name] new area last week on the lateral aspect of his pressure ulcer  more over the posterior trochanter. The original pressure ulcer looks quite stable has healthy granulation. 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 Almost fully epithelialized on the original superior area using Hydrofera Blue DTI on the left heel has peeled off this week to reveal [name] small superficial wound under denuded skin  and subcutaneous tissue Both areas between the first and second toes look better including nothing open on the left 02/21/18; The patient's wounds on his left ischial tuberosity and posterior left greater trochanter actually looked better. He has [name] large area of irritation around the area which I think is contact dermatitis. I am doubtful that this is fungal His original wound on the left lateral calf continues to improve we have been using Hydrofera Blue There is no open area in the left first second toe web space although there is [name] lot of thick callus The DTI on the left heel required debridement today of necrotic surface eschar and subcutaneous tissue using silver alginate Finally the area on the right first second toe webspace continues to contract using silver alginate and ketoconazole 02/28/18 Left ischial tuberosity wounds look better using silver alginate. Original wound on the left calf only has one small open area left using Hydrofera Blue DTI on the left heel required debridement mostly removing skin from around this wound surface. Using silver alginate The areas on the right first/second toe web space using silver alginate and ketoconazole 03/08/18 on evaluation today patient appears to be doing decently well as best I can tell in regard to his wounds. This is the first time that I have seen him as he generally is followed by Dr. Dellia Nims. With that being said none of his wounds appear to be infected he does have an area where there is some skin covering what appears to be [name] new wound on the left dorsal surface of his great toe. This is right at the nail bed. With that being said I do believe that debrided away some of the excess skin can be of benefit in this regard. Otherwise he has been tolerating the dressing changes without complication. 03/14/18; patient arrives today with the multiplicity of wounds that we are following. He has not been systemically unwell Original wound on the left  lateral calf now only has 2 small open areas we've been using Hydrofera Blue which should continue The deep tissue injury on the left heel requires debridement today. We've been using silver alginate The left first second toe and the right first second toe are both are reminiscence what I think was tinea pedis. Apparently some of the callus Surface between the toes was removed last week when it started draining. Purulent drainage coming from the wound on the ischial tuberosity on the left. 03/21/18-He is here in follow-up evaluation for multiple wounds. 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 He tolerated debridement. The only change to last week's recommendations is to discontinue antifungal cream between toes. He will follow-up next week 03/28/18; following up for multiple wounds;Concern this week is streaking redness and swelling in the right foot. He is going to need antibiotics for this. 03/31/18; follow-up for right foot cellulitis. Streaking redness and swelling in the right foot on 03/28/18. 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 I put him on clindamycin 300 mg every 6 and brought him in for [name]  quick check. He has an open wound between his first and second toes on the right foot as [name] potential source. 04/04/18; Right foot cellulitis is resolving he is completing clindamycin. This is truly good news Left lateral calf wound which is initial wound only has one small open area inferiorly this is close to healing out. He has compression stockings. We will use Hydrofera Blue right down to the epithelialization of this Nonviable surface on the left heel which was initially pressure with [name] DTI. We've been using Hydrofera Blue. I'm going to switch this back to silver alginate Left first second toe/tinea pedis this looks better using silver alginate Right first second toe tinea pedis using silver  alginate Large pressure ulcers on theLeft ischial tuberosity. Small wound here Looks better. I am uncertain about the surface over the large wound. Using silver alginate 04/11/18; Cellulitis in the right foot is resolved Left lateral calf wound which was his original wounds still has 2 tiny open areas remaining this is just about closed Nonviable surface on the left heel is better but still requires debridement Left first second toe/tinea pedis still open using silver alginate Right first second toe wound tinea pedis I asked him to go back to using ketoconazole and silver alginate Large pressure ulcers on the left ischial tuberosity this shear injury here is resolved. Wound is smaller. No evidence of infection using silver alginate 04/18/18; Patient arrives with an intense area of cellulitis in the right mid lower calf extending into the right heel area. Bright red and warm. Smaller area on the left anterior leg. He has [name] significant history of MRSA. He will definitely need antibioticsdoxycycline He now has 2 open areas on the left ischial tuberosity the original large wound and now [name] satellite area which I think was above his initial satellite areas. Not [name] wonderful surface on this satellite area surrounding erythema which looks like pressure related. His left lateral calf wound again his original wound is just about closed Left heel pressure injury still requiring debridement Left first second toe looks [name] lot better using silver alginate Right first second toe also using silver alginate and ketoconazole cream also looks better 04/20/18; the patient was worked in early today out of concerns with his cellulitis on the right leg. I had started him on doxycycline. This was 2 days ago. His wife was concerned about the swelling in the area. Also concerned about the left buttock. He has not been systemically unwell no fever chills. No nausea vomiting or diarrhea 04/25/18; the patient's left buttock wound  is continued to deteriorate he is using Hydrofera Blue. He is still completing clindamycin for the cellulitis on the right leg although all of this looks better. 05/02/18 Left buttock wound still with [name] lot of drainage and [name] very tightly adherent fibrinous necrotic surface. He has [name] deeper area superiorly The left lateral calf wound is still closed DTI wound on the left heel necrotic surface especially the circumference using Iodoflex Areas between his left first second toe and right first second toe both look better. Dorsally and the right first second toe he had [name] necrotic surface although at smaller. In using silver alginate and ketoconazole. I did [name] culture last week which was [name] deep tissue culture of the reminiscence of the open wound on the right first second toe dorsally. This grew [name] few Acinetobacter and [name] few methicillin-resistant staph aureus. Nevertheless the area actually this week looked better. I didn't feel the need to specifically address  this at least in terms of systemic antibiotics. 05/09/18; wounds are measuring larger more drainage per our intake. We are using Santyl covered with alginate on the large superficial buttock wounds, Iodosorb on the left heel, ketoconazole and silver alginate to the dorsal first and second toes bilaterally. 05/16/18; The area on his left buttock better in some aspects although the area superiorly over the ischial tuberosity required an extensive debridement.using Santyl Left heel appears stable. Using Iodoflex The areas between his first and second toes are not bad however there is spreading erythema up the dorsal aspect of his left foot this looks like cellulitis again. He is insensate the erythema is really very brilliant.o Erysipelas He went to see an allergist days ago because he was itching part of this he had lab work done. This showed [name] white count of 15.1 with 70% neutrophils. Hemoglobin of 11.4 and [name] platelet count of 659,000. Last white  count we had in Epic was [name] 2-1/2 years ago which was 25.9 but he was ill at the time. He was able to show me some lab work that was done by his primary physician the pattern is about the same. 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 But prompted me to go ahead and do x-rays of both feet and the pelvis rule out osteomyelitis. He also had [name] comprehensive metabolic panel this was reasonably normal his albumin was 3.7 liver function tests BUN/creatinine all normal 05/23/18; x-rays of both his feet from last week were negative for underlying pulmonary abnormality. The x-ray of his pelvis however showed mild irregularity in the left ischial which may represent some early osteomyelitis. The wound in the left ischial continues to get deeper clearly now exposed muscle. Each week necrotic surface material over this area. Whereas the rest of the wounds do not look so bad. The left ischial wound we have been using Santyl and calcium alginate T the left heel surface necrotic debris using Iodoflex o The left lateral leg is still healed Areas on the left dorsal foot and the right dorsal foot are about the same. There is some inflammation on the left which might represent contact dermatitis, fungal dermatitis I am doubtful cellulitis although this looks better than last week 05/30/18; CT scan done at Hospital did not show any osteomyelitis or abscess. Suggested the possibility of underlying cellulitis although I don't see [name] lot of evidence of this at the bedside The wound itself on the left buttock/upper thigh actually looks somewhat better. No debridement Left heel also looks better no debridement continue Iodoflex Both dorsal first second toe spaces appear better using Lotrisone. Left still required debridement 06/06/18; Intake reported some purulent looking drainage from the left gluteal wound. Using Santyl and calcium alginate Left heel looks better although still [name]  nonviable surface requiring debridement The left dorsal foot first/second webspace actually expanding and somewhat deeper. I may consider doing [name] shave biopsy of this area Right dorsal foot first/second webspace appears stable to improved. Using Lotrisone and silver alginate to both these areas 06/13/18 Left gluteal surface looks better. Now separated in the 2 wounds. No debridement required. Still drainage. We'll continue silver alginate Left heel continues to look better with Iodoflex continue this for at least another week Of his dorsal foot wounds the area on the left still has some depth although it looks better than last week. We've been using Lotrisone and silver alginate 06/20/18 Left gluteal continues to look better  healthy tissue Left heel continues to look better healthy granulation wound is smaller. He is using Iodoflex and his long as this continues continue the Iodoflex Dorsal right foot looks better unfortunately dorsal left foot does not. There is swelling and erythema of his forefoot. He had minor trauma to this several days ago but doesn't think this was enough to have caused any tissue injury. Foot looks like cellulitis, we have had this problem before 06/27/18 on evaluation today patient appears to be doing [name] little worse in regard to his foot ulcer. 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 Both of which would really be his only options for treating this infection. In the past he has been given and effusion of Orbactiv. This is done very well for him in the past again it's one time dosing IV antibiotic therapy. Subsequently I do believe this is something we're gonna need to see about doing at this point in time. Currently his other wounds seem to be doing somewhat better in my pinion I'm pretty happy in that regard. 07/03/18 on evaluation  today patient's wounds actually appear to be doing fairly well. He has been tolerating the dressing changes without complication. All in all he seems to be showing signs of improvement. In regard to the antibiotics he has been dealing with infectious disease since I saw him last week as far as getting this scheduled. In the end [facility]'s [facility] this coming Friday. In the meantime he has been continuing to perform the dressing changes in such as previous. There does not appear to be any evidence of infection worsengin at this time. 07/10/18; Since I last saw this man 2 weeks ago things have actually improved. IV antibiotics of resulted in less forefoot erythema although there is still some present. He is not systemically unwell Left buttock wounds 2 now have no depth there is increased epithelialization Using silver alginate Left heel still requires debridement using Iodoflex Left dorsal foot still with [name] sizable wound about the size of [name] border but healthy granulation Right dorsal foot still with [name] slitlike area using silver alginate 07/18/18; the patient's cellulitis in the left foot is improved in fact I think it is on its way to resolving. Left buttock wounds 2 both look better although the larger one has hypertension granulation we've been using silver alginate Left heel has some thick circumferential redundant skin over the wound edge which will need to be removed today we've been using Iodoflex Left dorsal foot is still [name] sizable wound required debridement using silver alginate The right dorsal foot is just about closed only [name] small open area remains here 07/25/18; left foot cellulitis is resolved Left buttock wounds 2 both look better. Hyper-granulation on the major area Left heel as some debris over the surface but otherwise looks [name] healthier wound. Using silver collagen Right dorsal foot is just about closed 07/31/18; arrives with our  intake nurse worried about purulent drainage from the buttock. We had hyper-granulation here last week His buttock wounds 2 continue to look better Left heel some debris over the surface but measuring smaller. Right dorsal foot unfortunately has openings between the toes Left foot superficial wound looks less aggravated. 08/07/18 Buttock wounds continue to look better although some of her granulation and the larger medial  wound. silver alginate Left heel continues to look [name] lot better.silver collagen Left foot superficial wound looks less stable. Requires debridement. He has [name] new wound superficial area on the foot on the lateral dorsal foot. Right foot looks better using silver alginate without Lotrisone 08/14/2018; patient was in the ER last week diagnosed with [name] UTI. He is now on Cefpodoxime and Macrodantin. Buttock wounds continued to be smaller. Using silver alginate Left heel continues to look better using silver collagen Left foot superficial wound looks as though it is improving Right dorsal foot area is just about healed. 08/21/2018; patient is completed his antibiotics for his UTI. He has 2 open areas on the buttocks. There is still not closed although the surface looks satisfactory. Using silver alginate Left heel continues to improve using silver collagen The bilateral dorsal foot areas which are at the base of his first and second toes/possible tinea pedis are actually stable on the left but worse on the right. The area on the left required debridement of necrotic surface. After debridement I obtained [name] specimen for PCR culture. The right dorsal foot which is been just about healed last week is now reopened 08/28/2018; culture done on the left dorsal foot showed coag negative staph both staph epidermidis and Lugdunensis. I think this is worthwhile initiating systemic treatment. I will use doxycycline given his long list of allergies. The area on the left heel slightly improved but  still requiring debridement. The large wound on the buttock is just about closed whereas the smaller one is larger. Using silver alginate in this area 09/04/2018; patient is completing his doxycycline for the left foot although this continues to be [name] very difficult wound area with very adherent necrotic debris. We are using silver alginate to all his wounds right foot left foot and the small wounds on his buttock, silver collagen on the left heel. 09/11/2018; once again this patient has intense erythema and swelling of the left forefoot. Lesser degrees of erythema in the right foot. He has [name] long list of allergies and intolerances. I will reinstitute doxycycline. 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 Using silver alginate Left heel also looks better using silver collagen Unfortunately both the areas on his feet look worse. The area on the left first second webspace is now gone through to the plantar part of his foot. The area on the left foot anteriorly is irritated with erythema and swelling in the forefoot. 09/25/2018 His wound on the left plantar heel looks better. Using silver collagen The area on the left buttock 2 small remnant areas. One is closed one is still open. Using silver alginate The areas between both his first and second toes look worse. This in spite of long-standing antifungal therapy with ketoconazole and silver alginate which should have antifungal activity He has small areas around his original wound on the left calf one is on the bottom of the original scar tissue and one superiorly both of these are small and superficial but again given wound history in this site this is worrisome 10/02/2018 Left plantar heel continues to gradually contract using silver collagen Left buttock wound is unchanged using silver alginate The areas on his dorsal feet between his first and second toes bilaterally look about the same. I prescribed  clindamycin ointment to see if we can address chronic staph colonization and also the underlying possibility of erythrasma The left lateral lower extremity wound is actually on  the lateral part of his ankle. Small open area here. We have been using silver alginate 10/09/2018; Left plantar heel continues to look healthy and contract. No debridement is required Left buttock slightly smaller with [name] tape injury wound just below which was new this week Dorsal feet somewhat improved I have been using clindamycin Left lateral looks lower extremity the actual open area looks worse although [name] lot of this is epithelialized. I am going to change to silver collagen today He has [name] lot more swelling in the right leg although this is not pitting not red and not particularly warm there is [name] lot of spasm in the right leg usually indicative of people with paralysis of some underlying discomfort. We have reviewed his vascular status from 2017 he had [name] left greater saphenous vein ablation. I wonder about referring him back to vascular surgery if the area on the left leg continues to deteriorate. 10/16/2018 in today for follow-up and management of multiple lower extremity ulcers. His left Buttock wound is much lower smaller and almost closed completely. The wound to the left ankle has began to reopen with Epithelialization and some adherent slough. He has multiple new areas to the left foot and leg. The left dorsal foot without much improvement. Wound present between left great webspace and 2nd toe. Erythema and edema present right leg. Right LE ultrasound obtained on 10/10/18 was negative for DVT . 10/23/2018; Left buttock is closed over. Still dry macerated skin but there is no open wound. I suspect this is chronic pressure/moisture Left lateral calf is quite [name] bit worse than when I saw this last. There is clearly drainage here he has macerated skin into the left plantar heel. We will change the primary dressing to  alginate Left dorsal foot has some improvement in overall wound area. Still using clindamycin and silver alginate Right dorsal foot about the same as the left using clindamycin and silver alginate The erythema in the right leg has resolved. He is DVT rule out was negative Left heel pressure area required debridement although the wound is smaller and the surface is health 10/26/2018 The patient came back in for his nurse check today predominantly because of the drainage coming out of the left lateral leg with [name] recent reopening of his original wound on the left lateral calf. He comes in today with [name] large amount of surrounding erythema around the wound extending from the calf into the ankle and even in the area on the dorsal foot. He is not systemically unwell. He is not febrile. Nevertheless this looks like cellulitis. We have been using silver alginate to the area. I changed him to [name] regular visit and I am going to prescribe him doxycycline. The rationale here is [name] long list of medication intolerances and [name] history of MRSA. I did not see anything that I thought would provide [name] valuable culture 10/30/2018 Follow-up from his appointment 4 days ago with really an extensive area of cellulitis in the left calf left lateral ankle and left dorsal foot. I put him on doxycycline. He has [name] long list of medication allergies which are true allergy reactions. 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 In any case he is [name] lot better today. The erythema especially in the anterior and lateral left calf is better. He still has left ankle erythema. He also is complaining about increasing edema in the right leg we have only been using Kerlix  Coban and he has been doing the wraps at home. Finally he has [name] spotty rash on the medial part of his upper left calf which looks like folliculitis or perhaps wrap occlusion type injury. Small superficial macules  not pustules 11/06/18 patient arrives today with again [name] considerable degree of erythema around the wound on the left lateral calf extending into the dorsal ankle and dorsal foot. This is [name] lot worse than when I saw this last week. He is on doxycycline really with not [name] lot of improvement. He has not been systemically unwell Wounds on the; left heel actually looks improved. Original area on the left foot and proximity to the first and second toes looks about the same. He has superficial areas on the dorsal foot, anterior calf and then the reopening of his original wound on the left lateral calf which looks about the same The only area he has on the right is the dorsal webspace first and second which is smaller. He has [name] large area of dry erythematous skin on the left buttock small open area here. 11/13/2018; the patient arrives in much better condition. The erythema around the wound on the left lateral calf is [name] lot better. Not sure whether this was the clindamycin or the TCA and ketoconazole or just in the improvement in edema control [stasis dermatitis]. In any case this is [name] lot better. The area on the left heel is very small and just about resolved using silver collagen we have been using silver alginate to the areas on his dorsal feet 11/20/2018; his wounds include the left lateral calf, left heel, dorsal aspects of both feet just proximal to the first second webspace. He is stable to slightly improved. I did not think any changes to his dressings were going to be necessary 11/27/2018 he has [name] reopening on the left buttock which is surrounded by what looks like tinea or perhaps some other form of dermatitis. The area on the left dorsal foot has some erythema around it I have marked this area but I am not sure whether this is cellulitis or not. Left heel is not closed. Left calf the reopening is really slightly longer and probably worse 1/13; in general things look better and smaller except for the  left dorsal foot. Area on the left heel is just about closed, left buttock looks better only [name] small wound remains in the skin looks better [using Lotrisone] 1/20; the area on the left heel only has [name] few remaining open areas here. Left lateral calf about the same in terms of size, left dorsal foot slightly larger right lateral foot still not closed. The area on the left buttock has no open wound and the surrounding skin looks [name] lot better 1/27; the area on the left heel is closed. Left lateral calf better but still requiring extensive debridements. The area on his left buttock is closed. He still has the open areas on the left dorsal foot which is slightly smaller in the right foot which is slightly expanded. We have been using Iodoflex on these areas as well 2/3; left heel is closed. Left lateral calf still requiring debridement using Iodoflex there is no open area on his left buttock however he has dry scaly skin over [name] large area of this. Not really responding well to the Lotrisone. Finally the areas on his dorsal feet at the level of the first second webspace are slightly smaller on the right and about the same on the left. Both  of these vigorously debrided with Anasept and gauze 2/10; left heel remains closed he has dry erythematous skin over the left buttock but there is no open wound here. Left lateral leg has come in and with. Still requiring debridement we have been using Iodoflex here. Finally the area on the left dorsal foot and right dorsal foot are really about the same extremely dry callused fissured areas. He does not yet have [name] dermatology appointment 2/17; left heel remains closed. He has [name] new open area on the left buttock. The area on the left lateral calf is bigger longer and still covered in necrotic debris. No major change in his foot areas bilaterally. I am awaiting for [name] dermatologist to look on this. We have been using ketoconazole I do not know that this is been doing any  good at all. 2/24; left heel remains closed. The left buttock wound that was new reopening last week looks better. The left lateral calf appears better also although still requires debridement. The major area on his foot is the left first second also requiring debridement. We have been putting Prisma on all wounds. I do not believe that the ketoconazole has done too much good for his feet. He will use Lotrisone I am going to give him [name] 2-week course of terbinafine. We still do not have [name] dermatology appointment 3/2 left heel remains closed however there is skin over bone in this area I pointed this out to him today. The left buttock wound is epithelialized but still does not look completely stable. The area on the left leg required debridement were using silver collagen here. With regards to his feet we changed to Lotrisone last week and silver alginate. 3/9; left heel remains closed. Left buttock remains closed. The area on the right foot is essentially closed. The left foot remains unchanged. Slightly smaller on the left lateral calf. Using silver collagen to both of these areas 3/16-Left heel remains closed. Area on right foot is closed. Left lateral calf above the lateral malleolus open wound requiring debridement with easy bleeding. Left dorsal wound proximal to first toe also debrided. Left ischial area open new. Patient has been using Prisma with wrapping every 3 days. Dermatology appointment is apparently tomorrow.Patient has completed his terbinafine 2-week course with some apparent improvement according to him, there is still flaking and dry skin in his foot on the left 3/23; area on the right foot is reopened. The area on the left anterior foot is about the same still [name] very necrotic adherent surface. He still has the area on the left leg and reopening is on the left buttock. He apparently saw dermatology although I do not have [name] note. According to the patient who is usually fairly well  informed they did not have any good ideas. Put him on oral terbinafine which she is been on before. 3/30; using silver collagen to all wounds. Apparently his dermatologist put him on doxycycline and rifampin presumably some culture grew staph. I do not have this result. He remains on terbinafine although I have used terbinafine on him before 4/6; patient has had [name] fairly substantial reopening on the right foot between the first and second toes. He is finished his terbinafine and I believe is on doxycycline and rifampin still as prescribed by dermatology. We have been using silver collagen to all his wounds although the patient reports that he thinks silver alginate does better on the wounds on his buttock. 4/13; the area on his left lateral  calf about the same size but it did not require debridement. Left dorsal foot just proximal to the webspace between the first and second toes is about the same. Still nonviable surface. I note some superficial bronze discoloration of the dorsal part of his foot Right dorsal foot just proximal to the first and second toes also looks about the same. I still think there may be the same discoloration I noted above on the left Left buttock wound looks about the same 4/20; left lateral calf appears to be gradually contracting using silver collagen. 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 The left dorsal foot wound is debrided of tightly adherent necrotic debris and really cleans up quite nicely. The right area is worse with expansion. I did not debride this it is now over the base of the second toe The area on his left buttock is smaller no debridement is required using silver collagen 5/4; left calf continues to make good progress. He arrives with erythema around the wounds on his dorsal foot which even extends to the plantar aspect. Very concerning for coexistent infection. He is finished the erythromycin I  gave him for possible erythrasma this does not seem to have helped. The area on the left foot is about the same base of the dorsal toes Is area on the buttock looks improved on the left 5/11; left calf and left buttock continued to make good progress. Left foot is about the same to slightly improved. Major problem is on the right foot. He has not had an x-ray. Deep tissue culture I did last week showed both Enterobacter and E. coli. I did not change the doxycycline I put him on empirically although neither 1 of these were plated to doxycycline. He arrives today with the erythema looking worse on both the dorsal and plantar foot. Macerated skin on the bottom of the foot. he has not been systemically unwell 5/18-Patient returns at 1 week, left calf wound appears to be making some progress, left buttock wound appears slightly worse than last time, left foot wound looks slightly better, right foot redness is marginally better. X-ray of both feet show no air or evidence of osteomyelitis. Patient is finished his Omnicef and terbinafine. He continues to have macerated skin on the bottom of the left foot as well as right 5/26; left calf wound is better, left buttock wound appears to have multiple small superficial open areas with surrounding macerated skin. X-rays that I did last time showed no evidence of osteomyelitis in either foot. He is finished cefdinir and doxycycline. I do not think that he was on terbinafine. He continues to have [name] large superficial open area on the right foot anterior dorsal and slightly between the first and second toes. I did send him to dermatology 2 months ago or so wondering about whether they would do [name] fungal scraping. I do not believe they did but did do [name] culture. We have been using silver alginate to the toe areas, he has been using antifungals at home topically either ketoconazole or Lotrisone. We are using silver collagen on the left foot, silver alginate on the right,  silver collagen on the left lateral leg and silver alginate on the left buttock 6/1; left buttock area is healed. We have the left dorsal foot, left lateral leg and right dorsal foot. We are using silver alginate to the areas on both feet and silver collagen to the area on his left lateral calf 6/8;  the left buttock apparently reopened late last week. He is not really sure how this happened. He is tolerating the terbinafine. Using silver alginate to all wounds 6/15; left buttock wound is larger than last week but still superficial. Came in the clinic today with [name] report of purulence from the left lateral leg I did not identify any infection Both areas on his dorsal feet appear to be better. He is tolerating the terbinafine. Using silver alginate to all wounds 6/22; left buttock is about the same this week, left calf quite [name] bit better. His left foot is about the same however he comes in with erythema and warmth in the right forefoot once again. Culture that I gave him in the beginning of May showed Enterobacter and E. coli. I gave him doxycycline and things seem to improve although neither 1 of these organisms was specifically plated. 6/29; left buttock is larger and dry this week. Left lateral calf looks to me to be improved. Left dorsal foot also somewhat improved right foot completely unchanged. The erythema on the right foot is still present. He is completing the Ceftin dinner that I gave him empirically [see discussion above.) 7/6 - All wounds look to be stable and perhaps improved, the left buttock wound is slightly smaller, per patient bleeds easily, completed ceftin, the right foot redness is less, he is on terbinafine 7/13; left buttock wound about the same perhaps slightly narrower. Area on the left lateral leg continues to narrow. Left dorsal foot slightly smaller right foot about the same. We are using silver alginate on the right foot and Hydrofera Blue to the areas on the left. Unna  boot on the left 2 layer compression on the right 7/20; left buttock wound absolutely the same. Area on lateral leg continues to get better. Left dorsal foot require debridement as did the right no major change in the 7/27; left buttock wound the same size necrotic debris over the surface. The area on the lateral leg is closed once again. His left foot looks better right foot about the same although there is some involvement now of the posterior first second toe area. He is still on terbinafine which I have given him for [name] month, not certain [name] centimeter major change 06/25/19-All wounds appear to be slightly improved according to report, left buttock wound looks clean, both foot wounds have minimal to no debris the right dorsal foot has minimal slough. We are using Hydrofera Blue to the left and silver alginate to the right foot and ischial wound. 8/10-Wounds all appear to be around the same, the right forefoot distal part has some redness which was not there before, however the wound looks clean and small. Ischial wound looks about the same with no changes 8/17; his wound on the left lateral calf which was his original chronic venous insufficiency wound remains closed. Since I last saw him the areas on the left dorsal foot right dorsal foot generally appear better but require debridement. The area on his left initial tuberosity appears somewhat larger to me perhaps hyper granulated and bleeds very easily. We have been using Hydrofera Blue to the left dorsal foot and silver alginate to everything else 8/24; left lateral calf remains closed. The areas on his dorsal feet on the webspace of the first and second toes bilaterally both look better. The area on the left buttock which is the pressure ulcer stage II slightly smaller. I change the dressing to Hydrofera Blue to all areas 8/31; left lateral  calf remains closed. The area on his dorsal feet bilaterally look better. Using Hydrofera Blue. Still  requiring debridement on the left foot. No change in the left buttock pressure ulcers however 9/14; left lateral calf remains closed. Dorsal feet look quite [name] bit better than 2 weeks ago. Flaking dry skin also [name] lot better with the ammonium lactate I gave him 2 weeks ago. The area on the left buttock is improved. He states that his Roho cushion developed [name] leak and he is getting [name] new one, in the interim he is offloading this vigorously 9/21; left calf remains closed. Left heel which was [name] possible DTI looks better this week. He had macerated tissue around the left dorsal foot right foot looks satisfactory and improved left buttock wound. I changed his dressings to his feet to silver alginate bilaterally. Continuing Hydrofera Blue on the left buttock. 9/28 left calf remains closed. Left heel did not develop anything [possible DTI] dry flaking skin on the left dorsal foot. Right foot looks satisfactory. Improved left buttock wound. We are using silver alginate on his feet Hydrofera Blue on the buttock. I have asked him to go back to the Lotrisone on his feet including the wounds and surrounding areas 10/5; left calf remains closed. The areas on the left and right feet about the same. [name] lot of this is epithelialized however debris over the remaining open areas. He is using Lotrisone and silver alginate. The area on the left buttock using Hydrofera Blue 10/26. Patient has been out for 3 weeks secondary to Covid concerns. He tested negative but I think his wife tested positive. He comes in today with the left foot substantially worse, right foot about the same. Even more concerning he states that the area on his left buttock closed over but then reopened and is considerably deeper in one aspect than it was before [stage III wound] 11/2; left foot really about the same as last week. Quarter sized wound on the dorsal foot just proximal to the first second toes. Surrounding erythema with areas of denuded  epithelium. This is not really much different looking. Did not look like cellulitis this time however. Right foot area about the same.. We have been using silver alginate alginate on his toes Left buttock still substantial irritated skin around the wound which I think looks somewhat better. We have been using Hydrofera Blue here. 11/9; left foot larger than last week and [name] very necrotic surface. Right foot I think is about the same perhaps slightly smaller. Debris around the circumference also addressed. Unfortunately on the left buttock there is been [name] decline. Satellite lesions below the major wound distally and now [name] an additional one posteriorly we have been using Hydrofera Blue but I think this is [name] pressure issue 11/16; left foot ulcer dorsally again [name] very adherent necrotic surface. Right foot is about the same. Not much change in the pressure ulcer on his left buttock. 11/30; left foot ulcer dorsally basically the same as when I saw him 2 weeks ago. Very adherent fibrinous debris on the wound surface. Patient reports [name] lot of drainage as well. 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 We have been using Iodoflex, patient changed back to alginate because of the drainage. Area on his right dorsal foot really looks benign with [name] healthier surface certainly [name] lot better than on the left. Left buttock wounds all improved using Hydrofera Blue 12/7; left dorsal foot again no improvement.  Tightly adherent debris. PCR culture I did last week only showed likely skin contaminant. I have gone ahead and done [name] punch biopsy of this which is about the last thing in terms of investigations I can think to do. He has known venous insufficiency and venous hypertension and this could be the issue here. The area on the right foot is about the same left buttock slightly worse according to our intake nurse secondary to [facility] Blue sticking to the wound 12/14;  biopsy of the left foot that I did last time showed changes that could be related to wound healing/chronic stasis dermatitis phenomenon no neoplasm. We have been using silver alginate to both feet. I change the one on the left today to Sorbact and silver alginate to his other 2 wounds 12/28; the patient arrives with the following problems; Major issue is the dorsal left foot which continues to be [name] larger deeper wound area. Still with [name] completely nonviable surface Paradoxically the area mirror image on the right on the right dorsal foot appears to be getting better. He had some loss of dry denuded skin from the lower part of his original wound on the left lateral calf. Some of this area looked [name] little vulnerable and for this reason we put him in wrap that on this side this week The area on his left buttock is larger. He still has the erythematous circular area which I think is [name] combination of pressure, sweat. This does not look like cellulitis or fungal dermatitis 11/26/2019; -Dorsal left foot large open wound with depth. Still debris over the surface. Using Sorbact The area on the dorsal right foot paradoxically has closed over He has [name] reopening on the left ankle laterally at the base of his original wound that extended up into the calf. This appears clean. The left buttock wound is smaller but with very adherent necrotic debris over the surface. We have been using silver alginate here as well The patient had arterial studies done in 2017. He had biphasic waveforms at the dorsalis pedis and posterior tibial bilaterally. ABI in the left was 1.17. Digit waveforms were dampened. He has slight spasticity in the great toes I do not think [name] TBI would be possible 1/11; the patient comes in today with [name] sizable reopening between the first and second toes on the right. This is not exactly in the same location where we have been treating wounds previously. According to our intake nurse this was actually  fairly deep but 0.6 cm. The area on the left dorsal foot looks about the same the surface is somewhat cleaner using Sorbact, his MRI is in 2 days. We have not managed yet to get arterial studies. The new reopening on the left lateral calf looks somewhat better using alginate. The left buttock wound is about the same using alginate 1/18; the patient had his ARTERIAL studies which were quite normal. ABI in the right at 1.13 with triphasic/biphasic waveforms on the left ABI 1.06 again with triphasic/biphasic waveforms. It would not have been possible to have done [name] toe brachial index because of spasticity. We have been using Sorbac to the left foot alginate to the rest of his wounds on the right foot left lateral calf and left buttock 1/25; arrives in clinic with erythema and swelling of the left forefoot worse over the first MTP area. This extends laterally dorsally and but also posteriorly. Still has an area on the left lateral part of the lower part of his calf  wound it is eschared and clearly not closed. Area on the left buttock still with surrounding irritation and erythema. Right foot surface wound dorsally. The area between the right and first and second toes appears better. 2/1; The left foot wound is about the same. Erythema slightly better I gave him [name] week of doxycycline empirically Right foot wound is more extensive extending between the toes to the plantar surface Left lateral calf really no open surface on the inferior part of his original wound however the entire area still looks vulnerable Absolutely no improvement in the left buttock wound required debridement. 2/8; the left foot is about the same. Erythema is slightly improved I gave him clindamycin last week. Right foot looks better he is using Lotrimin and silver alginate He has [name] breakdown in the left lateral calf. Denuded epithelium which I have removed Left buttock about the same were using Hydrofera Blue 2/15; left foot is  about the same there is less surrounding erythema. Surface still has tightly adherent debris which I have debriding however not making any progress Right foot has [name] substantial wound on the medial right second toe between the first and second webspace. Still an open area on the left lateral calf distal area. Buttock wound is about the same 2/22; left foot is about the same less surrounding erythema. Surface has adherent debris. Polymen Ag Right foot area significant wound between the first and second toes. We have been using silver alginate here Left lateral leg polymen Ag at the base of his original venous insufficiency wound Left buttock some improvement here 3/1; Right foot is deteriorating in the first second toe webspace. Larger and more substantial. We have been using silver alginate. Left dorsal foot about the same markedly adherent surface debris using PolyMem Ag Left lateral calf surface debris using PolyMem AG Left buttock is improved again using PolyMem Ag. He is completing his terbinafine. The erythema in the foot seems better. He has been on this for 2 weeks 3/8; no improvement in any wound area in fact he has [name] small open area on the dorsal midfoot which is new this week. He has not gotten his foot x-rays yet 3/15; his x-rays were both negative for osteomyelitis of both feet. No major change in any of his wounds on the extremities however his buttock wounds are better. We have been using polymen on the buttocks, left lower leg. Iodoflex on the left foot and silver alginate on the right 3/22; arrives in clinic today with the 2 major issues are the improvement in the left dorsal foot wound which for once actually looks healthy with [name] nice healthy wound surface without debridement. Using Iodoflex here. Unfortunately on the left lateral calf which is in the distal part of his original wound he came to the clinic here for there was purulent drainage noted some increased breakdown  scattered around the original area and [name] small area proximally. We we are using polymen here will change to silver alginate today. His buttock wound on the left is better and I think the area on the right first second toe webspace is also improved 3/29; left dorsal foot looks better. Using Iodoflex. Left ankle culture from deterioration last time grew E. coli, Enterobacter and Enterococcus. I will give him [name] course of cefdinir although that will not cover Enterococcus. The area on the right foot in the webspace of the first and second toe lateral first toe looks better. The area on his buttock is about healed Vascular  appointment is on April 21. This is to look at his venous system vis--vis continued breakdown of the wounds on the left including the left lateral leg and left dorsal foot he. He has had previous ablations on this side 4/5; the area between the right first and second toes lateral aspect of the first toe looks better. Dorsal aspect of the left first toe on the left foot also improved. Unfortunately the left lateral lower leg is larger and there is [name] second satellite wound superiorly. The usual superficial abrasions on the left buttock overall better but certainly not closed 4/12; the area between the right first and second toes is improved. Dorsal aspect of the left foot also slightly smaller with [name] vibrant healthy looking surface. No real change in the left lateral leg and the left buttock wound is healed He has an unaffordable co-pay for Apligraf. Appointment with vein and vascular with regards to the left leg venous part of the circulation is on 4/21 4/19; we continue to see improvement in all wound areas. Although this is minor. He has his vascular appointment on 4/21. The area on the left buttock has not reopened although right in the center of this area the skin looks somewhat threatened 4/26; the left buttock is unfortunately reopened. In general his left dorsal foot has [name] healthy  surface and looks somewhat smaller although it was not measured as such. The area between his first and second toe webspace on the right as [name] small wound against the first toe. The patient saw vascular surgery. The real question I was asking was about the small saphenous vein on the left. He has previously ablated left greater saphenous vein. Nothing further was commented on on the left. Right greater saphenous vein without reflux at the saphenofemoral junction or proximal thigh there was no indication for ablation of the right greater saphenous vein duplex was negative for DVT bilaterally. They did not think there was anything from [name] vascular surgery point of view that could be offered. They ABIs within normal limits 5/3; only small open area on the left buttock. The area on the left lateral leg which was his original venous reflux is now 2 wounds both which look clean. We are using Iodoflex on the left dorsal foot which looks healthy and smaller. He is down to [name] very tiny area between the right first and second toes, using silver alginate 5/10; all of his wounds appear better. We have much better edema control in 4 layer compression on the left. This may be the factor that is allowing the left foot and left lateral calf to heal. He has external compression garments at home 04/14/20-All of his wounds are progressing well, the left forefoot is practically closed, left ischium appears to be about the same, right toe webspace is also smaller. The left lateral leg is about the same, continue using Hydrofera Blue to this, silver alginate to the ischium, Iodoflex to the toe space on the right 6/7; most of his wounds outside of the left buttock are doing well. The area on the left lateral calf and left dorsal foot are smaller. The area on the right foot in between the first and second toe webspace is barely visible although he still says there is some drainage here is the only reason I did not heal this  out. Unfortunately the area on the left buttock almost looks like he has [name] skin tear from tape. He has open wound and then [name] large flap  of skin that we are trying to get adherence over an area just next to the remaining wound 6/21; 2 week follow-up. I believe is been here for nurse visits. Miraculously the area between his first and second toes on the left dorsal foot is closed over. Still open on the right first second web space. The left lateral calf has 2 open areas. Distally this is more superficial. The proximal area had [name] little more depth and required debridement of adherent necrotic material. His buttock wound is actually larger we have been using silver alginate here 6/28; the patient's area on the left foot remains closed. Still open wet area between the first and second toes on the right and also extending into the plantar aspect. We have been using silver alginate in this location. He has 2 areas on the left lower leg part of his original long wounds which I think are better. We have been using Hydrofera Blue here. Hydrofera Blue to the left buttock which is stable 7/12; left foot remains closed. Left ankle is closed. May be [name] small area between his right first and second toes the only truly open area is on the left buttock. We have been using Hydrofera Blue here 7/19; patient arrives with marked deterioration especially in the left foot and ankle. We did not put him in [name] compression wrap on the left last week in fact he wore his juxta lite stockings on either side although he does not have an underlying stocking. He has [name] reopening on the left dorsal foot, left lateral ankle and [name] new area on the right dorsal ankle. More worrisome is the degree of erythema on the left foot extending on the lateral foot into the lateral lower leg on the left 7/26; the patient had erythema and drainage from the lateral left ankle last week. Culture of this grew MRSA resistant to doxycycline and clindamycin  which are the 2 antibiotics we usually use with this patient who has multiple antibiotic allergies including linezolid, trimethoprim sulfamethoxazole. I had give him an empiric doxycycline and he comes in the area certainly looks somewhat better although it is blotchy in his lower leg. He has not been systemically unwell. He has had areas on the left dorsal foot which is [name] reopening, chronic wounds on the left lateral ankle. Both of these I think are secondary to chronic venous insufficiency. The area between his first and second toes is closed as far as I can tell. He had [name] new wrap injury on the right dorsal ankle last week. Finally he has an area on the left buttock. We have been using silver alginate to everything except the left buttock we are using Hydrofera Blue 06/30/20-Patient returns at 1 week, has been given [name] sample dose pack of NUZYRA which is [name] tetracycline derivative [omadacycline], patient has completed those, we have been using silver alginate to almost all the wounds except the left ischium where we are using Hydrofera Blue all of them look better 8/16; since I last saw the patient he has been doing well. The area on the left buttock, left lateral ankle and left foot are all closed today. He has completed the [place] I gave him last time and tolerated this well. He still has open areas on the right dorsal ankle and in the right first second toe area which we are using silver alginate. 8/23; we put him in his bilateral external compression stockings last week as he did not have anything open on either  leg except for concerning area between the right first and second toe. He comes in today with an area on the left dorsal foot slightly more proximal than the original wound, the left lateral foot but this is actually [name] continuation of the area he had on the left lateral ankle from last time. As well he is opened up on the left buttock again. 8/30; comes in today with things looking [name] lot  better. The area on the left lower ankle has closed down as has the left foot but with eschar in both areas. The area on the dorsal right ankle is also epithelialized. Very little remaining of the left buttock wound. We have been using silver alginate on all wound areas 9/13; the area in the first second toe webspace on the right has fully epithelialized. He still has some vulnerable epithelium on the right and the ankle and the dorsal foot. He notes weeping. He is using his juxta lite stocking. On the left again the left dorsal foot is closed left lateral ankle is closed. We went to the juxta lite stocking here as well. Still vulnerable in the left buttock although only 2 small open areas remain here 9/27; 2-week follow-up. We did not look at his left leg but the patient says everything is closed. He is [name] bit disturbed by the amount of edema in his left foot he is using juxta lite stockings but asking about over the toes stockings which would be 30/40, will talk to him next time. According to him there is no open wound on either the left foot or the left ankle/calf He has an open area on the dorsal right calf which I initially point [name] wrap injury. He has superficial remaining wound on the left ischial tuberosity been using silver alginate although he says this sticks to the wound 10/5; we gave him 2-week follow-up but he called yesterday expressing some concerns about his right foot right ankle and the left buttock. He came in early. There is still no open areas on the left leg and that still in his juxta lite stocking 10/11; he only has 1 small area on the left buttock that remains measuring millimeters 1 mm. Still has the same irritated skin in this area. We recommended zinc oxide when this eventually closes and pressure relief is meticulously is he can do this. He still has an area on the dorsal part of his right first through third toes which is [name] bit irritated and still open and on the dorsal  ankle near the crease of the ankle. We have been using silver alginate and using his own stocking. He has nothing open on the left leg or foot 10/25; 2-week follow-up. Not nearly as good on the left buttock as I was hoping. For open areas with 5 looking threatened small. He has the erythematous irritated chronic skin in this area. 1 area on the right dorsal ankle. He reports this area bleeds easily Right dorsal foot just proximal to the base of his toes We have been using silver alginate. 11/8; 2-week follow-up. Left buttock is about the same although I do not think the wounds are in the same location we have been using silver alginate. I have asked him to use zinc oxide on the skin around the wounds. He still has [name] small area on the right dorsal ankle he reports this bleeds easily Right dorsal foot just proximal to the base of the toes does not have anything open although the  skin is very dry and scaly He has [name] new opening on the nailbed of the left great toe. Nothing on the left ankle 11/29; 3-week follow-up. Left buttock has 2 open areas. And washing of these wounds today started bleeding easily. Suggesting very friable tissue. We have been using silver alginate. Right dorsal ankle which I thought was initially [name] wrap injury we have been using silver alginate. Nothing open between the toes that I can see. He states the area on the left dorsal toe nailbed healed after the last visit in 2 or 3 days 12/13; 3-week follow-up. His left buttock now has 3 open areas but the original 2 areas are smaller using polymen here. Surrounding skin looks better. The right dorsal ankle is closed. He has [name] small opening on the right dorsal foot at the level of the third toe. In general the skin looks better here. He is wearing his juxta lite stocking on the left leg says there is nothing open 11/24/2020; 3 weeks follow-up. His left buttock still has the 3 open areas. We have been using polymen but due to lack of  response he changed to [facility] area. Surrounding skin is dry erythematous and irritated looking. There is no evidence of infection either bacterial or fungal however there is loss of surface epithelium He still has very dry skin in his foot causing irritation and erythema on the dorsal part of his toes. This is not responded to prolonged courses of antifungal simply looks dry and irritated 1/24; left buttock area still looks about the same he was unable to find the triad ointment that we had suggested. The area on the right lower leg just above the dorsal ankle has reopened and the areas on the right foot between the first second and second third toes and scaling on the bottom of the foot has been about the same for quite some time now. been using silver alginate to all wound areas 2/7; left buttock wound looked quite good although not much smaller in terms of surface area surrounding skin looks better. Only [name] few dry flaking areas on the right foot in between the first and second toes the skin generally looks better here [ammonium lactate]. Finally the area on the right dorsal ankle is closed 2/21; There is no open area on the right foot even between the right first and second toe. Skin around this area dorsally and plantar aspects look better. He has [name] reopening of the area on the right ankle just above the crease of the ankle dorsally. I continue to think that this is probably friction from spasms may be even this time with his stocking under the compression stockings. Wounds on his left buttock look about the same there [name] couple of areas that have reopened. He has [name] total square area of loss of epithelialization. This does not look like infection it looks like [name] contact dermatitis but I just cannot determine to what 3/14; there is nothing on the right foot between the first and second toes this was carefully inspected under illumination. Some chronic irritation on the dorsal part of his  foot from toes 1-3 at the base. Nothing really open here substantially. Still has an area on the right foot/ankle that is actually larger and hyper granulated. His buttock area on the left is just about closed however he has chronic inflammation with loss of the surface epithelial layer 3/28; 2-week follow-up. In clinic today with [name] new wound on the left anterior mid  tibia. Says this happened about 2 weeks ago. He is not really sure how wonders about the spasticity of his legs at night whether that could have caused this other than that he does not have [name] good idea. He has been using topical antibiotics and silver alginate. The area on his right dorsal ankle seems somewhat better. Finally everything on his left buttock is closed. 4/11; 2-week follow-up. All of his wounds are better except for the area over the ischium and left buttock which have opened up widely again. At least part of this is covered in necrotic fibrinous material another part had rolled nonviable skin. The area on the right ankle, left anterior mid tibia are both [name] lot better. He had no open wounds on either foot including the areas between the first and second toes 4/25; patient presents for 2-week follow-up. He states that the wounds are overall stable. He has no complaints today and states he is using Hydrofera Blue to open wounds. 5/9; have not seen this man in over [name] month. For my memory he has open areas on the left mid tibia and right ankle. T oday he has new open area on the right dorsal foot which we have not had [name] problem with recently. He has the sustained area on the left buttock He is also changed his insurance at the beginning of the year [employer]. We will need prior authorizations for debridement 5/23; patient presents for 2-week follow-up. He has prior authorizations for debridement. He denies any issues in the past 2 weeks with his wound care. He has been using Hydrofera Blue to all the wounds. He does report  [name] circular rash to the upper left leg that is new. He denies acute signs of infection. 6/6; 2-week follow-up. The patient has open wounds on the left buttock which are worse than the last time I saw this about [name] month ago. He also has [name] new area to me on the left anterior mid tibia with some surrounding erythema. The area on the dorsal ankle on the right is closed but I think this will be [name] friction injury every time this area is exposed to either our wraps or his compression stockings caused by unrelenting spasms in this leg. 6/20; 2-week follow-up. The patient has open wounds on the left buttock which is about the same. Using [facility] here. - The left mid tibia has [name] static amount of surrounding erythema. Also [name] raised area in the center. We have been using Hydrofera Blue here. Finally he has broken down in his dorsal right foot extending between the first and second toes and going to the base of the first and second toe webspace. I have previously assumed that this was severe venous hypertension 7/5; 2-week follow-up The left buttock wound actually looks better. We are using Hydrofera Blue. He has extensive skin irritation around this area and I have not really been able to get that any better. I have tried Lotrisone i.e. antifungals and steroids. More most recently we have just been using Coloplast really looks about the same. The left mid tibia which was new last week culture to have very resistant staph aureus. Not only methicillin-resistant but doxycycline resistant. The patient has [name] plethora of antibiotic allergies including sulfa, linezolid. I used topical bacitracin on this but he has not started this yet. In addition he has an expanding area of erythema with [name] wound on the dorsal right foot. I did [name] deep tissue culture of this  area today 7/12; Left buttock area actually looks better surrounding skin also looks less irritated. Left mid tibia looks about the same. He is using  bacitracin this is not worse Right dorsal foot looks about the same as well. The left first toe also looks about the same 7/19; left buttock wound continues to improve in terms of open areas Left mid tibia is still concerning amount of swelling he is using bacitracin Dorsal left first toe somewhat smaller Right dorsal foot somewhat smaller 7/25; left buttock wound actually continues to improve Left mid tibia area has less swelling. I gave him all my samples of new Nuzyra. This seems to have helped although the wound is still open it. His abrasion closed by here Left dorsal great toe really no better. Still [name] very nonviable surface Right dorsal foot perhaps some better. We have been using bacitracin and silver nitrate to the areas on his lower legs and Hydrofera Blue to the area on the buttock. 8/16 Disappointed that his left buttock wound is actually more substantial. Apparently during the last nurse visit these were both very small. He has continued irritation to [name] large area of skin on his buttock. I have never been able to totally explain this although I think it some combination of the way he sits, pressure, moisture. He is not incontinent enough to contribute to this. Left dorsal great toe still fibrinous debris on the surface that I have debrided today Large area across the dorsal right toes. The area on the left anterior mid tibia has less swelling. He completed the [place]. This does not look infected although the tissue is still fried 8/30; 2-week follow-up. Left buttock areas not improved. We used Hydrofera Blue on this. Weeping wet with the surrounding erythema that I have not been able to control even with Lotrisone and topical Coloplast Left dorsal great toe looks about the same More substantial area again at the base of his toes on the left which is new this week. Area across the dorsal right toes looks improved The left anterior mid tibia looks like it is trying to  close 9/13; 2-week follow-up. Using silver alginate on all of his wounds. The left dorsal foot does not look any better. He has the area on the dorsal toe and also the areas at the base of all of his toes 1 through 3. On the right foot he has [name] similar pattern in [name] similar area. He has the area on his left mid tibia that looks fairly healthy. Finally the area on his left buttock looks somewhat bette 9/20; culture I did of the left foot which was [name] deep tissue culture last time showed E. coli he has erythema around this wound. Still [name] completely necrotic surface. His right dorsal foot looks about the same. He has [name] very friable surface to the left anterior mid tibia. Both buttock wounds look better. We have been using silver alginate to all wounds 10/4; he has completed the cephalexin that I gave him last time for the left foot. He is using topical gentamicin under silver alginate silver alginate being applied to all the wounds. Unfortunately all the wounds look irritated on his dorsal right foot dorsal left foot left mid tibia. I wonder if this could be [name] silver allergy. I am going to change him to [facility] on the lower extremity. The skin on the left buttock and left posterior thigh still flaking dry and irritated. This has continued no matter what I have  applied topically to this. He has [name] solitary open wound which by itself does not look too bad however the entire area of surrounding skin does not change no matter what we have applied here 10/18; the area on the left dorsal foot and right dorsal foot both look better. The area on the right extends into the plantar but not between his toes. We have been using silver alginate. He still has [name] rectangular erythematous area around the area on the left tibia. The wound itself is very small. Finally everything on his left buttock looks [name] little larger the skin is erythematous 11/15; patient comes in with the left dorsal and right dorsal foot  distally looking somewhat better. Still nonviable surface on the left foot which required debridement. He still has the area on the left anterior mid tibia although this looks somewhat better. He has [name] new area on the right lateral lower leg just above the ankle. Finally his left buttock looks terrible with multiple superficial open wounds geometric square shaped area of chronic erythema which I have not been able to sort through 11/29; right dorsal foot and left dorsal foot both look somewhat better. No debridement required. He has the fragile area on the left anterior mid tibia this looks and continues to look somewhat better. Right lateral lower leg just above the ankle we identified last time also looks better. In general the area on his left buttock looks improved. We are using Hydrofera Blue to all wound areas 12/13; right dorsal foot looks better. The area on the right lateral leg is healed. Left dorsal foot has 2 open areas both of which require debridement. The fragile area on the left anterior mid tibial looks better. Smaller area on his buttocks. Were using Hydrofera Blue 1/10; patient comes in with everything looking slightly larger and/or worse. This includes his left buttock, reopening of the left mid tibia, larger areas on the left dorsal foot and what looks to be [name] cellulitis on the right dorsal and plantar foot. We have been using Hydrofera Blue on all wounds. 1/17; right dorsal foot distally looks better today. The left foot has 2 open wounds that are about the same surrounding erythema. 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 No malignancy they did not stain for fungus 1/24; his right distal foot is not closed dry and scaly but the wound looks like it is contracting. I did not debride anything here. Problem on his left dorsal foot with expanding  erythema. Apparently there were problems last week getting the Elesa Hacker however it is now available at the [facility] but [name] week later. He is using ketoconazole and Coloplast to the left buttock along with Hydrofera Blue this actually looks quite [name] bit better today. 1/31; right dorsal foot again is dry and scaly but looking to contract. He has been using [name] moisturizer on his feet at my request but he is not sure which 1. The left dorsal foot wounds look about the same there is erythema here that I marked last week however after course of Nuzyra it certainly is not any better but not any worse either. Finally on his left buttock the skin continues to look better he has the original wound but [name] new substantial area towards the gluteal cleft. Almost like [name] skin tear. I used scissors to remove skin and subcutaneous tissue here silver nitrate and  direct pressure 2/7; right dorsal foot. This does not look too much different from last week. Some erythema skin dry and scaly. No debridement. Left dorsal great toe again still not much improvement. I did remove flaking dry skin and callus from around the edge. Finally on his left buttock. The skin is somewhat better in the periwound. Surface wounds are superficial somewhat better than last week. 01/26/2022: Is [name] little bit of [name] mystery as to why his wounds fail to respond to treatments and actually seem to get worse. This is my first encounter with this patient. He was previously followed by Dr. Dellia Nims. Based upon my review of the chart, it seems that there is [name] little bit of [name] mystery as to why his wounds do not respond as anticipated to the interventions applied and sometimes even get worse. Biopsies have been performed and he was seen by dermatology in [place], but that did not shed any light on the matter. T oday, his gluteal wound is larger, with substantial drainage, rather malodorous. The food wounds are not terrible, but he has [name] lot of callus and  scaly skin around these. He is currently getting silver alginate on the gluteal wound, with idodoflex to the feet. He is using lotrisone on his legs for the dry, scaly skin. Electronic Signature(s) Signed: 01/26/2022 11:13:04 AM By: Fredirick Maudlin MD FACS Entered By: Fredirick Maudlin on 01/26/2022 11:13:04 -------------------------------------------------------------------------------- Physical Exam Details Patient Name: Date of Service: [name], [name] 01/26/2022 8:15 [name] Medical Record Number: [id number] Patient Account Number: [id number] Date of Birth/Sex: Treating RN: 09-12-1986 (36 y.o. M) Primary Care Provider: Iva, [place] Other Clinician: Referring Provider: Treating Provider/Extender: Graciela Husbands, [name]: 144 [id number] .Tachycardic, asymptomatic.. . . No acute distress. Respiratory Normal work of breathing on room air. Notes 01/26/2022: Wound examI removed callus and fibrinous exudate from both the foot wounds. These are largely unchanged from prior visits. The left buttock is macerated with multiple open skin areas. Malodorous drainage present. Slough debrided. Electronic Signature(s) Signed: 01/26/2022 11:30:31 AM By: Fredirick Maudlin MD FACS Entered By: Fredirick Maudlin on 01/26/2022 11:30:31 -------------------------------------------------------------------------------- Physician Orders Details Patient Name: Date of Service: [name], [name] 01/26/2022 8:15 [name] Medical Record Number: [id number] Patient Account Number: [id number] Date of Birth/Sex: Treating RN: 1986/01/16 (36 y.o. 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 - Dr Celine Ahr Return [name] Bathing/ Shower/ Hygiene May shower and wash wound with soap and water. - on days that dressing is changed Edema Control - Lymphedema / SCD / Other Elevate legs to the level of the heart or above for 30 minutes daily and/or when sitting, [name] frequency of: - throughout the day Moisturize legs daily. - with dressing changes Compression stocking or Garment 30-40 mm/Hg pressure to: - Juxtalite  to both legs daily Off-Loading Roho cushion for wheelchair Turn and reposition every 2 hours Wound Treatment Wound #41R - Ischium Wound Laterality: Left Cleanser: Soap and Water Every Other Day/30 Days Discharge Instructions: May shower and wash wound with dial antibacterial soap and water prior to dressing change. Peri-Wound Care: Zinc Oxide Ointment 30g tube Every Other Day/30 Days Discharge Instructions: Apply Zinc Oxide to periwound with each dressing change Prim Dressing: KerraCel Ag Gelling Fiber Dressing, 4x5 in (silver alginate) Every Other Day/30 Days ary Discharge Instructions: Apply silver alginate to wound bed as instructed Secondary Dressing: Woven Gauze Sponge, Non-Sterile 4x4 in Every Other Day/30 Days Discharge Instructions: Apply over primary dressing as directed. Secondary Dressing: ABD Pad, 8x10 Every Other Day/30 Days Discharge Instructions: Apply over primary dressing as directed. Secondary Dressing: Drawtex 4x4 in Every Other Day/30  Days Discharge Instructions: Apply over primary dressing as directed. Secured With: 47M Medipore H Soft Cloth Surgical T 4 x 2 (in/yd) (Generic) Every Other Day/30 Days ape Discharge Instructions: Secure dressing with tape as directed. Wound #52 - Foot Wound Laterality: Dorsal, Right Cleanser: Soap and Water Every Other Day/30 Days Discharge Instructions: May shower and wash wound with dial antibacterial soap and water prior to dressing change. Peri-Wound Care: Triamcinolone 15 (g) Every Other Day/30 Days Discharge Instructions: Use triamcinolone 15 (g) as directed Prim Dressing: IODOFLEX 0.9% Cadexomer Iodine Pad 4x6 cm Every Other Day/30 Days ary Discharge Instructions: Apply to wound bed as instructed Secondary Dressing: Woven Gauze Sponge, Non-Sterile 4x4 in Every Other Day/30 Days Discharge Instructions: Apply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 patient is here for follow-up evaluation. He is tolerating compression therapy. His voicing no complaints or concerns he is here anticipating an Apligraf 03/03/17; he arrives today  with an adherent necrotic surface. I don't think this is surface is going to be amenable for Apligraf's. The erythema around his wound and on the left dorsal foot has resolved he is off antibiotics 03/10/17; better-looking surface today. I don't think he can tolerate Apligraf's. He tells me he had [name] wound VAC after [name] skin graft years ago to this area and they had difficulty with [name]  seal. The erythema continues to be stable around this some degree of chronic venous inflammation but he also has recurrent cellulitis. We have been using Iodoflex 03/17/17; continued improvement in the surface and may be small changes in dimensions. Using Iodoflex which seems the only thing that will control his surface 03/24/17- He is here for follow up evaluation of his LLE lateral ulceration and ulcer to right dorsal foot/toe space. He is voicing no complaints or concerns, He is tolerating compression wrap. 03/31/17 arrives today with [name] much healthier looking wound on the left lower extremity. We have been using Iodoflex for [name] prolonged period of time which has for the first time prepared and adequate looking wound bed although we have not had much in the way of wound dimension improvement. He also has [name] small wound between the first and second toe on the right 04/07/17; arrives today with [name] healthy-looking wound bed and at least the top 50% of this wound appears to be now her. No debridement was required I have changed him to [facility] last week after prolonged Iodoflex. He did not do well with Apligraf's. We've had [name] re-opening between the first and second toe on the right 04/14/17; arrives today with [name] healthier looking wound bed contractions and the top 50% of this wound and some on the lesser 50%. Wound bed appears healthy. The area between the first and second toe on the right still remains problematic 04/21/17; continued very gradual improvement. Using [facility] 04/28/17; continued very gradual improvement in the left lateral leg venous insufficiency wound. His periwound erythema is very mild. We have been using Hydrofera Blue. Wound is making progress especially in the superior 50% 05/05/17; he continues to have very gradual improvement in the left lateral venous insufficiency wound. Both in terms with an length rings are improving. I debrided this every 2 weeks with #5 curet and we have  been using Hydrofera Blue and again making good progress With regards to the wounds between his right first and second toe which I thought might of been tinea pedis he is not making as much progress very dry scaly skin over the area. Also the area at the base of the left first and second toe in [name] similar condition 05/12/17; continued gradual improvement in the refractory left lateral venous insufficiency wound on the left. Dimension smaller. Surface still requiring debridement using Hydrofera Blue 05/19/17; continued gradual improvement in the refractory left lateral venous ulceration. Careful inspection of the wound bed underlying rumination suggested some degree of epithelialization over the surface no debridement indicated. Continue Hydrofera Blue difficult areas between his toes first and third on the left than first and second on the right. I'm going to change to silver alginate from silver collagen. Continue ketoconazole as I suspect underlying tinea pedis 05/26/17; left lateral leg venous insufficiency wound. We've been using Hydrofera Blue. I believe that there is expanding epithelialization over the surface of the wound albeit not coming from the wound circumference. This is [name] bit of an odd situation in which the epithelialization seems to be coming from the surface  of the wound rather than in the exact circumference. There is still small open areas mostly along the lateral margin of the wound. ooHe has unchanged areas between the left first and second and the right first second toes which I been treating for tenia pedis 06/02/17; left lateral leg venous insufficiency wound. We have been using Hydrofera Blue. Somewhat smaller from the wound circumference. The surface of the wound remains [name] bit on it almost epithelialized sedation in appearance. I use an open curette today debridement in the surface of all of this especially the edges ooSmall open wounds remaining on the dorsal right first and  second toe interspace and the plantar left first second toe and her face on the left 06/09/17; wound on the left lateral leg continues to be smaller but very gradual and very dry surface using Hydrofera Blue 06/16/17 requires weekly debridements now on the left lateral leg although this continues to contract. I changed to silver collagen last week because of dryness of the wound bed. Using Iodoflex to the areas on his first and second toes/web space bilaterally 06/24/17; patient with history of paraplegia also chronic venous insufficiency with lymphedema. Has [name] very difficult wound on the left lateral leg. This has been gradually reducing in terms of with but comes in with [name] very dry adherent surface. High switch to silver collagen [name] week or so ago with hydrogel to keep the area moist. This is been refractory to multiple dressing attempts. He also has areas in his first and second toes bilaterally in the anterior and posterior web space. I had been using Iodoflex here after [name] prolonged course of silver alginate with ketoconazole was ineffective [question tinea pedis] 07/14/17; patient arrives today with [name] very difficult adherent material over his left lateral lower leg wound. He also has surrounding erythema and poorly controlled edema. He was switched his Santyl last visit which the nurses are applying once during his doctor visit and once on [name] nurse visit. He was also reduced to 2 layer compression I'm not exactly sure of the issue here. 07/21/17; better surface today after 1 week of Iodoflex. Significant cellulitis that we treated last week also better. [Doxycycline] 07/28/17 better surface today with now 2 weeks of Iodoflex. Significant cellulitis treated with doxycycline. He has now completed the doxycycline and he is back to his usual degree of chronic venous inflammation/stasis dermatitis. He reminds me he has had ablations surgery here 08/04/17; continued improvement with Iodoflex to the left lateral  leg wound in terms of the surface of the wound although the dimensions are better. 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 Problematic areas on the plantar aspect of the first second toe web space on the left and the dorsal aspect of the first second toe web space on the right. At one point I felt these were probably related to chronic fungal infections in treated him aggressively for this although we have not made any improvement here. 08/11/17; left lateral leg. Surface continues to improve with the Iodoflex although we are not seeing much improvement in overall wound dimensions. Areas on his plantar left foot and right foot show no improvement. In fact the right foot looks somewhat worse 08/18/17; left lateral leg. We changed to [facility] Blue last week after [name] prolonged course of Iodoflex which helps get the surface better. It appears that the wound with is improved. Continue with difficult areas on the left dorsal first second and  plantar first second on the right 09/01/17; patient arrives in clinic today having had [name] temperature of 103 yesterday. He was seen in the ER and [facility]. The patient was concerned he could have cellulitis again in the right leg however they diagnosed him with [name] UTI and he is now on Keflex. He has [name] history of cellulitis which is been recurrent and difficult but this is been in the left leg, in the past 5 use doxycycline. He does in and out catheterizations at home which are risk factors for UTI 09/08/17; patient will be completing his Keflex this weekend. The erythema on the left leg is considerably better. He has [name] new wound today on the medial part of the right leg small superficial almost looks like [name] skin tear. He has worsening of the area on the right dorsal first and second toe. His major area on the left lateral leg is better. Using Hydrofera Blue on all areas 09/15/17; gradual reduction in  width on the long wound in the left lateral leg. No debridement required. He also has wounds on the plantar aspect of his left first second toe web space and on the dorsal aspect of the right first second toe web space. 09/22/17; there continues to be very gradual improvements in the dimensions of the left lateral leg wound. He hasn't round erythematous spot with might be pressure on his wheelchair. There is no evidence obviously of infection no purulence no warmth ooHe has [name] dry scaled area on the plantar aspect of the left first second toe ooImproved area on the dorsal right first second toe. 09/29/17; left lateral leg wound continues to improve in dimensions mostly with an is still [name] fairly long but increasingly narrow wound. ooHe has [name] dry scaled area on the plantar aspect of his left first second toe web space ooIncreasingly concerning area on the dorsal right first second toe. In fact I am concerned today about possible cellulitis around this wound. The areas extending up his second toe and although there is deformities here almost appears to abut on the nailbed. 10/06/17; left lateral leg wound continues to make very gradual progress. Tissue culture I did from the right first second toe dorsal foot last time grew MRSA and enterococcus which was vancomycin sensitive. This was not sensitive to clindamycin or doxycycline. He is allergic to Zyvox and sulfa we have therefore arrange for him to have dalvance infusion tomorrow. He is had this in the past and tolerated it well 10/20/17; left lateral leg wound continues to make decent progress. This is certainly reduced in terms of with there is advancing epithelialization.ooThe cellulitis in the right foot looks better although he still has [name] deep wound in the dorsal aspect of the first second toe web space. Plantar left first toe web space on the left I think is making some progress 10/27/17; left lateral leg wound continues to make decent  progress. Advancing epithelialization.using Hydrofera Blue ooThe right first second toe web space wound is better-looking using silver alginate ooImprovement in the left plantar first second toe web space. Again using silver alginate 11/03/17 left lateral leg wound continues to make decent progress albeit slowly. Using Hydrofera Blue ooThe right per second toe web space continues to be [name] very problematic looking punched out wound. I obtained [name] piece of tissue for deep culture I did extensively treated this for fungus. It is difficult to imagine that this is [name] pressure area as the patient states other than going outside he doesn't  really wear shoes at home ooThe left plantar first second toe web space looked fairly senescent. Necrotic edges. This required debridement oochange to Hydrofera Blue to all wound areas 11/10/17; left lateral leg wound continues to contract. Using Hydrofera Blue ooOn the right dorsal first second toe web space dorsally. Culture I did of this area last week grew MRSA there is not an easy oral option in this patient was multiple antibiotic allergies or intolerances. This was only [name] rare culture isolate I'm therefore going to use Bactroban under silver alginate ooOn the left plantar first second toe web space. Debridement is required here. This is also unchanged 11/17/17; left lateral leg wound continues to contract using Hydrofera Blue this is no longer the major issue. ooThe major concern here is the right first second toe web space. He now has an open area going from dorsally to the plantar aspect. There is now wound on the inner lateral part of the first toe. Not [name] very viable surface on this. There is erythema spreading medially into the forefoot. ooNo major change in the left first second toe plantar wound 11/24/17; left lateral leg wound continues to contract using Hydrofera Blue. Nice improvement today ooThe right first second toe web space all of this looks [name]  lot less angry than last week. I have given him clindamycin and topical Bactroban for MRSA and terbinafine for the possibility of underlining tinea pedis that I could not control with ketoconazole. Looks somewhat better ooThe area on the plantar left first second toe web space is weeping with dried debris around the wound 12/01/17; left lateral leg wound continues to contract he Hydrofera Blue. It is becoming thinner in terms of with nevertheless it is making good improvement. ooThe right first second toe web space looks less angry but still [name] large necrotic-looking wounds starting on the plantar aspect of the right foot extending between the toes and now extensively on the base of the right second toe. I gave him clindamycin and topical Bactroban for MRSA anterior benefiting for the possibility of underlying tinea pedis. Not looking better today ooThe area on the left first/second toe looks better. Debrided of necrotic debris 12/05/17* the patient was worked in urgently today because over the weekend he found blood on his incontinence bad when he woke up. He was found to have an ulcer by his wife who does most of his wound care. He came in today for [place] to look at this. He has not had [name] history of wounds in his buttocks in spite of his paraplegia. 12/08/17; seen in follow-up today at his usual appointment. He was seen earlier this week and found to have [name] new wound on his buttock. We also follow him for wounds on the left lateral leg, left first second toe web space and right first second toe web space 12/15/17; we have been using Hydrofera Blue to the left lateral leg which has improved. The right first second toe web space has also improved. Left first second toe web space plantar aspect looks stable. The left buttock has worsened using Santyl. Apparently the buttock has drainage 12/22/17; we have been using Hydrofera Blue to the left lateral leg which continues to improve now 2 small wounds separated  by normal skin. He tells [place] he had [name] fever up to 100 yesterday he is prone to UTIs but has not noted anything different. He does in and out catheterizations. The area between the first and second toes today does not look good  necrotic surface covered with what looks to be purulent drainage and erythema extending into the third toe. I had gotten this to something that I thought look better last time however it is not look good today. He also has [name] necrotic surface over the buttock wound which is expanded. I thought there might be infection under here so I removed [name] lot of the surface with [name] #5 curet though nothing look like it really needed culturing. He is been using Santyl to this area 12/27/17; his original wound on the left lateral leg continues to improve using Hydrofera Blue. I gave him samples of Baxdella although he was unable to take them out of fear for an allergic reaction ["lump in his throat"].the culture I did of the purulent drainage from his second toe last week showed both enterococcus and [name] set Enterobacter I was also concerned about the erythema on the bottom of his foot although paradoxically although this looks somewhat better today. 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 We've been using silver alginate here. They came up today that he sleeps in [name] recliner, I'm not sure why but I asked him to stop this 01/03/18; his original wound we've been using Hydrofera Blue is now separated into 2 areas. ooUlcer on his left buttock is better he is off the recliner and sleeping in bed ooFinally both wound areas between his first and second toes also looks some better 01/10/18; his original wound on the left lateral leg is now separated into 2 wounds we've been using Hydrofera Blue ooUlcer on his left buttock has some drainage. There is [name] small probing site going into muscle layer superiorly.using  silver alginate -He arrives today with [name] deep tissue injury on the left heel ooThe wound on the dorsal aspect of his first second toe on the left looks [name] lot betterusing silver alginate ketoconazole ooThe area on the first second toe web space on the right also looks [name] lot bette 01/17/18; his original wound on the left lateral leg continues to progress using Hydrofera Blue ooUlcer on his left buttock also is smaller surface healthier except for [name] small probing site going into the muscle layer superiorly. 2.4 cm of tunneling in this area ooDTI on his left heel we have only been offloading. Looks better than last week no threatened open no evidence of infection oothe wound on the dorsal aspect of the first second toe on the left continues to look like it's regressing we have only been using silver alginate and terbinafine orally ooThe area in the first second toe web space on the right also looks to be [name] lot better using silver alginate and terbinafine I think this was prompted by tinea pedis 01/31/18; the patient was hospitalized in [place] last week apparently for [name] complicated UTI. He was discharged on cefepime he does in and out catheterizations. In the hospital he was discovered M I don't mild elevation of AST and ALT and the terbinafine was stopped.predictably the pressure ulcer on s his buttock looks betterusing silver alginate. The area on the left lateral leg also is better using Hydrofera Blue. The area between the first and second toes on the left better. First and second toes on the right still substantial but better. Finally the DTI on the left heel has held together and looks like it's resolving 02/07/18-he is here in follow-up evaluation for multiple ulcerations. He has new injury  to the lateral aspect of the last issue [name] pressure ulcer, he states this is from adhesive removal trauma. He states he has tried multiple adhesive products with no success. All other ulcers appear stable.  The left heel DTI is resolving. We will continue with same treatment plan and follow-up next week. 02/14/18; follow-up for multiple areas. ooHe has [name] new area last week on the lateral aspect of his pressure ulcer more over the posterior trochanter. The original pressure ulcer looks quite stable has healthy granulation. 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 Almost fully epithelialized on the original superior area using Hydrofera Blue ooDTI on the left heel has peeled off this week to reveal [name] small superficial wound under denuded skin and subcutaneous tissue ooBoth areas between the first and second toes look better including nothing open on the left 02/21/18; ooThe patient's wounds on his left ischial tuberosity and posterior left greater trochanter actually looked better. He has [name] large area of irritation around the area which I think is contact dermatitis. I am doubtful that this is fungal ooHis original wound on the left lateral calf continues to improve we have been using Hydrofera Blue ooThere is no open area in the left first second toe web space although there is [name] lot of thick callus ooThe DTI on the left heel required debridement today of necrotic surface eschar and subcutaneous tissue using silver alginate ooFinally the area on the right first second toe webspace continues to contract using silver alginate and ketoconazole 02/28/18 ooLeft ischial tuberosity wounds look better using silver alginate. ooOriginal wound on the left calf only has one small open area left using Hydrofera Blue ooDTI on the left heel required debridement mostly removing skin from around this wound surface. Using silver alginate ooThe areas on the right first/second toe web space using silver alginate and ketoconazole 03/08/18 on evaluation today patient appears to be doing decently well as best  I can tell in regard to his wounds. This is the first time that I have seen him as he generally is followed by Dr. Dellia Nims. With that being said none of his wounds appear to be infected he does have an area where there is some skin covering what appears to be [name] new wound on the left dorsal surface of his great toe. This is right at the nail bed. With that being said I do believe that debrided away some of the excess skin can be of benefit in this regard. Otherwise he has been tolerating the dressing changes without complication. 03/14/18; patient arrives today with the multiplicity of wounds that we are following. He has not been systemically unwell ooOriginal wound on the left lateral calf now only has 2 small open areas we've been using Hydrofera Blue which should continue ooThe deep tissue injury on the left heel requires debridement today. We've been using silver alginate ooThe left first second toe and the right first second toe are both are reminiscence what I think was tinea pedis. Apparently some of the callus Surface between the toes was removed last week when it started draining. ooPurulent drainage coming from the wound on the ischial tuberosity on the left. 03/21/18-He is here in follow-up evaluation for multiple wounds. 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 He tolerated debridement. The only change to last week's recommendations is to discontinue antifungal cream between  toes. He will follow-up next week 03/28/18; following up for multiple wounds;Concern this week is streaking redness and swelling in the right foot. He is going to need antibiotics for this. 03/31/18; follow-up for right foot cellulitis. Streaking redness and swelling in the right foot on 03/28/18. 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 I put him on clindamycin 300 mg every 6 and brought him in for [name] quick check. He has an  open wound between his first and second toes on the right foot as [name] potential source. 04/04/18; ooRight foot cellulitis is resolving he is completing clindamycin. This is truly good news ooLeft lateral calf wound which is initial wound only has one small open area inferiorly this is close to healing out. He has compression stockings. We will use Hydrofera Blue right down to the epithelialization of this ooNonviable surface on the left heel which was initially pressure with [name] DTI. We've been using Hydrofera Blue. I'm going to switch this back to silver alginate ooLeft first second toe/tinea pedis this looks better using silver alginate ooRight first second toe tinea pedis using silver alginate ooLarge pressure ulcers on theLeft ischial tuberosity. Small wound here Looks better. I am uncertain about the surface over the large wound. Using silver alginate 04/11/18; ooCellulitis in the right foot is resolved ooLeft lateral calf wound which was his original wounds still has 2 tiny open areas remaining this is just about closed ooNonviable surface on the left heel is better but still requires debridement ooLeft first second toe/tinea pedis still open using silver alginate ooRight first second toe wound tinea pedis I asked him to go back to using ketoconazole and silver alginate ooLarge pressure ulcers on the left ischial tuberosity this shear injury here is resolved. Wound is smaller. No evidence of infection using silver alginate 04/18/18; ooPatient arrives with an intense area of cellulitis in the right mid lower calf extending into the right heel area. Bright red and warm. Smaller area on the left anterior leg. He has [name] significant history of MRSA. He will definitely need antibioticsoodoxycycline ooHe now has 2 open areas on the left ischial tuberosity the original large wound and now [name] satellite area which I think was above his initial satellite areas. Not [name] wonderful surface on this  satellite area surrounding erythema which looks like pressure related. ooHis left lateral calf wound again his original wound is just about closed ooLeft heel pressure injury still requiring debridement ooLeft first second toe looks [name] lot better using silver alginate ooRight first second toe also using silver alginate and ketoconazole cream also looks better 04/20/18; the patient was worked in early today out of concerns with his cellulitis on the right leg. I had started him on doxycycline. This was 2 days ago. His wife was concerned about the swelling in the area. Also concerned about the left buttock. He has not been systemically unwell no fever chills. No nausea vomiting or diarrhea 04/25/18; the patient's left buttock wound is continued to deteriorate he is using Hydrofera Blue. He is still completing clindamycin for the cellulitis on the right leg although all of this looks better. 05/02/18 ooLeft buttock wound still with [name] lot of drainage and [name] very tightly adherent fibrinous necrotic surface. He has [name] deeper area superiorly ooThe left lateral calf wound is still closed ooDTI wound on the left heel necrotic surface especially the circumference using Iodoflex ooAreas between his left first second toe and right first second toe both look better. Dorsally and  the right first second toe he had [name] necrotic surface although at smaller. In using silver alginate and ketoconazole. I did [name] culture last week which was [name] deep tissue culture of the reminiscence of the open wound on the right first second toe dorsally. This grew [name] few Acinetobacter and [name] few methicillin-resistant staph aureus. Nevertheless the area actually this week looked better. I didn't feel the need to specifically address this at least in terms of systemic antibiotics. 05/09/18; wounds are measuring larger more drainage per our intake. We are using Santyl covered with alginate on the large superficial buttock wounds, Iodosorb on  the left heel, ketoconazole and silver alginate to the dorsal first and second toes bilaterally. 05/16/18; ooThe area on his left buttock better in some aspects although the area superiorly over the ischial tuberosity required an extensive debridement.using Santyl ooLeft heel appears stable. Using Iodoflex ooThe areas between his first and second toes are not bad however there is spreading erythema up the dorsal aspect of his left foot this looks like cellulitis again. He is insensate the erythema is really very brilliant.o Erysipelas He went to see an allergist days ago because he was itching part of this he had lab work done. This showed [name] white count of 15.1 with 70% neutrophils. Hemoglobin of 11.4 and [name] platelet count of 659,000. Last white count we had in [facility] was [name] 2-1/2 years ago which was 25.9 but he was ill at the time. He was able to show me some lab work that was done by his primary physician the pattern is about the same. 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 But prompted me to go ahead and do x-rays of both feet and the pelvis rule out osteomyelitis. He also had [name] comprehensive metabolic panel this was reasonably normal his albumin was 3.7 liver function tests BUN/creatinine all normal 05/23/18; x-rays of both his feet from last week were negative for underlying pulmonary abnormality. The x-ray of his pelvis however showed mild irregularity in the left ischial which may represent some early osteomyelitis. The wound in the left ischial continues to get deeper clearly now exposed muscle. Each week necrotic surface material over this area. Whereas the rest of the wounds do not look so bad. ooThe left ischial wound we have been using Santyl and calcium alginate ooT the left heel surface necrotic debris using Iodoflex o ooThe left lateral leg is still healed ooAreas on the left dorsal foot and the right dorsal foot are about the same.  There is some inflammation on the left which might represent contact dermatitis, fungal dermatitis I am doubtful cellulitis although this looks better than last week 05/30/18; CT scan done at Hospital did not show any osteomyelitis or abscess. Suggested the possibility of underlying cellulitis although I don't see [name] lot of evidence of this at the bedside ooThe wound itself on the left buttock/upper thigh actually looks somewhat better. No debridement ooLeft heel also looks better no debridement continue Iodoflex ooBoth dorsal first second toe spaces appear better using Lotrisone. Left still required debridement 06/06/18; ooIntake reported some purulent looking drainage from the left gluteal wound. Using Santyl and calcium alginate ooLeft heel looks better although still [name] nonviable surface requiring debridement ooThe left dorsal foot first/second webspace actually expanding and somewhat deeper. I may consider doing [name] shave biopsy of this area ooRight dorsal foot first/second webspace appears stable to improved. Using Lotrisone and silver alginate to both these  areas 06/13/18 ooLeft gluteal surface looks better. Now separated in the 2 wounds. No debridement required. Still drainage. We'll continue silver alginate ooLeft heel continues to look better with Iodoflex continue this for at least another week ooOf his dorsal foot wounds the area on the left still has some depth although it looks better than last week. We've been using Lotrisone and silver alginate 06/20/18 ooLeft gluteal continues to look better healthy tissue ooLeft heel continues to look better healthy granulation wound is smaller. He is using Iodoflex and his long as this continues continue the Iodoflex ooDorsal right foot looks better unfortunately dorsal left foot does not. There is swelling and erythema of his forefoot. He had minor trauma to this several days ago but doesn't think this was enough to have caused any tissue  injury. Foot looks like cellulitis, we have had this problem before 06/27/18 on evaluation today patient appears to be doing [name] little worse in regard to his foot ulcer. 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 Both of which would really be his only options for treating this infection. In the past he has been given and effusion of Orbactiv. This is done very well for him in the past again it's one time dosing IV antibiotic therapy. Subsequently I do believe this is something we're gonna need to see about doing at this point in time. Currently his other wounds seem to be doing somewhat better in my pinion I'm pretty happy in that regard. 07/03/18 on evaluation today patient's wounds actually appear to be doing fairly well. He has been tolerating the dressing changes without complication. All in all he seems to be showing signs of improvement. In regard to the antibiotics he has been dealing with infectious disease since I saw him last week as far as getting this scheduled. In the end [facility]'s [facility] this coming Friday. In the meantime he has been continuing to perform the dressing changes in such as previous. There does not appear to be any evidence of infection worsengin at this time. 07/10/18; ooSince I last saw this man 2 weeks ago things have actually improved. IV antibiotics of resulted in less forefoot erythema although there is still some present. He is not systemically unwell ooLeft buttock wounds o2 now have no depth there is increased epithelialization Using silver alginate ooLeft heel still requires debridement using Iodoflex ooLeft dorsal foot still with [name] sizable wound about the size of [name] border but healthy granulation ooRight dorsal foot still with [name] slitlike area using silver  alginate 07/18/18; the patient's cellulitis in the left foot is improved in fact I think it is on its way to resolving. ooLeft buttock wounds o2 both look better although the larger one has hypertension granulation we've been using silver alginate ooLeft heel has some thick circumferential redundant skin over the wound edge which will need to be removed today we've been using Iodoflex ooLeft dorsal foot is still [name] sizable wound required debridement using silver alginate ooThe right dorsal foot is just about closed only [name] small open area remains here 07/25/18; left foot cellulitis is resolved ooLeft buttock wounds o2 both look better. Hyper-granulation on the major area ooLeft heel as some debris over the surface but otherwise looks [name] healthier wound. Using silver collagen ooRight dorsal foot is  just about closed 07/31/18; arrives with our intake nurse worried about purulent drainage from the buttock. We had hyper-granulation here last week ooHis buttock wounds o2 continue to look better ooLeft heel some debris over the surface but measuring smaller. ooRight dorsal foot unfortunately has openings between the toes ooLeft foot superficial wound looks less aggravated. 08/07/18 ooButtock wounds continue to look better although some of her granulation and the larger medial wound. silver alginate ooLeft heel continues to look [name] lot better.silver collagen ooLeft foot superficial wound looks less stable. Requires debridement. He has [name] new wound superficial area on the foot on the lateral dorsal foot. ooRight foot looks better using silver alginate without Lotrisone 08/14/2018; patient was in the ER last week diagnosed with [name] UTI. He is now on Cefpodoxime and Macrodantin. ooButtock wounds continued to be smaller. Using silver alginate ooLeft heel continues to look better using silver collagen ooLeft foot superficial wound looks as though it is improving ooRight dorsal foot area is just  about healed. 08/21/2018; patient is completed his antibiotics for his UTI. ooHe has 2 open areas on the buttocks. There is still not closed although the surface looks satisfactory. Using silver alginate ooLeft heel continues to improve using silver collagen ooThe bilateral dorsal foot areas which are at the base of his first and second toes/possible tinea pedis are actually stable on the left but worse on the right. The area on the left required debridement of necrotic surface. After debridement I obtained [name] specimen for PCR culture. ooThe right dorsal foot which is been just about healed last week is now reopened 08/28/2018; culture done on the left dorsal foot showed coag negative staph both staph epidermidis and Lugdunensis. I think this is worthwhile initiating systemic treatment. I will use doxycycline given his long list of allergies. The area on the left heel slightly improved but still requiring debridement. ooThe large wound on the buttock is just about closed whereas the smaller one is larger. Using silver alginate in this area 09/04/2018; patient is completing his doxycycline for the left foot although this continues to be [name] very difficult wound area with very adherent necrotic debris. We are using silver alginate to all his wounds right foot left foot and the small wounds on his buttock, silver collagen on the left heel. 09/11/2018; once again this patient has intense erythema and swelling of the left forefoot. Lesser degrees of erythema in the right foot. He has [name] long list of allergies and intolerances. I will reinstitute doxycycline. 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 Using silver alginate ooLeft heel also looks better using silver collagen ooUnfortunately both the areas on his feet look worse. The area on the left first second webspace is now gone through to the plantar part of his foot. The area on the left foot  anteriorly is irritated with erythema and swelling in the forefoot. 09/25/2018 ooHis wound on the left plantar heel looks better. Using silver collagen ooThe area on the left buttock 2 small remnant areas. One is closed one is still open. Using silver alginate ooThe areas between both his first and second toes look worse. This in spite of long-standing antifungal therapy with ketoconazole and silver alginate which should have antifungal activity ooHe has small areas around his original wound on the left calf one is on the bottom of the original scar tissue and one superiorly both of these are small and superficial but again given wound  history in this site this is worrisome 10/02/2018 ooLeft plantar heel continues to gradually contract using silver collagen ooLeft buttock wound is unchanged using silver alginate ooThe areas on his dorsal feet between his first and second toes bilaterally look about the same. 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 Small open area here. We have been using silver alginate 10/09/2018; ooLeft plantar heel continues to look healthy and contract. No debridement is required ooLeft buttock slightly smaller with [name] tape injury wound just below which was new this week ooDorsal feet somewhat improved I have been using clindamycin ooLeft lateral looks lower extremity the actual open area looks worse although [name] lot of this is epithelialized. I am going to change to silver collagen today He has [name] lot more swelling in the right leg although this is not pitting not red and not particularly warm there is [name] lot of spasm in the right leg usually indicative of people with paralysis of some underlying discomfort. We have reviewed his vascular status from 2017 he had [name] left greater saphenous  vein ablation. I wonder about referring him back to vascular surgery if the area on the left leg continues to deteriorate. 10/16/2018 in today for follow-up and management of multiple lower extremity ulcers. His left Buttock wound is much lower smaller and almost closed completely. The wound to the left ankle has began to reopen with Epithelialization and some adherent slough. He has multiple new areas to the left foot and leg. The left dorsal foot without much improvement. Wound present between left great webspace and 2nd toe. Erythema and edema present right leg. Right LE ultrasound obtained on 10/10/18 was negative for DVT . 10/23/2018; ooLeft buttock is closed over. Still dry macerated skin but there is no open wound. I suspect this is chronic pressure/moisture ooLeft lateral calf is quite [name] bit worse than when I saw this last. There is clearly drainage here he has macerated skin into the left plantar heel. We will change the primary dressing to alginate ooLeft dorsal foot has some improvement in overall wound area. Still using clindamycin and silver alginate ooRight dorsal foot about the same as the left using clindamycin and silver alginate ooThe erythema in the right leg has resolved. He is DVT rule out was negative ooLeft heel pressure area required debridement although the wound is smaller and the surface is health 10/26/2018 ooThe patient came back in for his nurse check today predominantly because of the drainage coming out of the left lateral leg with [name] recent reopening of his original wound on the left lateral calf. He comes in today with [name] large amount of surrounding erythema around the wound extending from the calf into the ankle and even in the area on the dorsal foot. He is not systemically unwell. He is not febrile. Nevertheless this looks like cellulitis. We have been using silver alginate to the area. I changed him to [name] regular visit and I am going to prescribe him  doxycycline. The rationale here is [name] long list of medication intolerances and [name] history of MRSA. I did not see anything that I thought would provide [name] valuable culture 10/30/2018 ooFollow-up from his appointment 4 days ago with really an extensive area of cellulitis in the left calf left lateral ankle and left dorsal foot. I put him on doxycycline. He has  [name] long list of medication allergies which are true allergy reactions. 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 In any case he is [name] lot better today. The erythema especially in the anterior and lateral left calf is better. He still has left ankle erythema. He also is complaining about increasing edema in the right leg we have only been using Kerlix Coban and he has been doing the wraps at home. Finally he has [name] spotty rash on the medial part of his upper left calf which looks like folliculitis or perhaps wrap occlusion type injury. Small superficial macules not pustules 11/06/18 patient arrives today with again [name] considerable degree of erythema around the wound on the left lateral calf extending into the dorsal ankle and dorsal foot. This is [name] lot worse than when I saw this last week. He is on doxycycline really with not [name] lot of improvement. He has not been systemically unwell Wounds on the; left heel actually looks improved. Original area on the left foot and proximity to the first and second toes looks about the same. He has superficial areas on the dorsal foot, anterior calf and then the reopening of his original wound on the left lateral calf which looks about the same ooThe only area he has on the right is the dorsal webspace first and second which is smaller. ooHe has [name] large area of dry erythematous skin on the left buttock small open area here. 11/13/2018; the patient arrives in much better condition. The erythema around the wound on the left lateral calf is [name] lot  better. Not sure whether this was the clindamycin or the TCA and ketoconazole or just in the improvement in edema control [stasis dermatitis]. In any case this is [name] lot better. The area on the left heel is very small and just about resolved using silver collagen we have been using silver alginate to the areas on his dorsal feet 11/20/2018; his wounds include the left lateral calf, left heel, dorsal aspects of both feet just proximal to the first second webspace. He is stable to slightly improved. I did not think any changes to his dressings were going to be necessary 11/27/2018 he has [name] reopening on the left buttock which is surrounded by what looks like tinea or perhaps some other form of dermatitis. The area on the left dorsal foot has some erythema around it I have marked this area but I am not sure whether this is cellulitis or not. Left heel is not closed. Left calf the reopening is really slightly longer and probably worse 1/13; in general things look better and smaller except for the left dorsal foot. Area on the left heel is just about closed, left buttock looks better only [name] small wound remains in the skin looks better [using Lotrisone] 1/20; the area on the left heel only has [name] few remaining open areas here. Left lateral calf about the same in terms of size, left dorsal foot slightly larger right lateral foot still not closed. The area on the left buttock has no open wound and the surrounding skin looks [name] lot better 1/27; the area on the left heel is closed. Left lateral calf better but still requiring extensive debridements. The area on his left buttock is closed. He still has the open areas on the left dorsal foot which is slightly smaller in the right foot which is slightly expanded. We have been using Iodoflex on these areas  as well 2/3; left heel is closed. Left lateral calf still requiring debridement using Iodoflex there is no open area on his left buttock however he has dry scaly skin  over [name] large area of this. Not really responding well to the Lotrisone. Finally the areas on his dorsal feet at the level of the first second webspace are slightly smaller on the right and about the same on the left. Both of these vigorously debrided with Anasept and gauze 2/10; left heel remains closed he has dry erythematous skin over the left buttock but there is no open wound here. Left lateral leg has come in and with. Still requiring debridement we have been using Iodoflex here. Finally the area on the left dorsal foot and right dorsal foot are really about the same extremely dry callused fissured areas. He does not yet have [name] dermatology appointment 2/17; left heel remains closed. He has [name] new open area on the left buttock. The area on the left lateral calf is bigger longer and still covered in necrotic debris. No major change in his foot areas bilaterally. I am awaiting for [name] dermatologist to look on this. We have been using ketoconazole I do not know that this is been doing any good at all. 2/24; left heel remains closed. The left buttock wound that was new reopening last week looks better. The left lateral calf appears better also although still requires debridement. The major area on his foot is the left first second also requiring debridement. We have been putting Prisma on all wounds. I do not believe that the ketoconazole has done too much good for his feet. He will use Lotrisone I am going to give him [name] 2-week course of terbinafine. We still do not have [name] dermatology appointment 3/2 left heel remains closed however there is skin over bone in this area I pointed this out to him today. The left buttock wound is epithelialized but still does not look completely stable. The area on the left leg required debridement were using silver collagen here. With regards to his feet we changed to Lotrisone last week and silver alginate. 3/9; left heel remains closed. Left buttock remains closed. The  area on the right foot is essentially closed. The left foot remains unchanged. Slightly smaller on the left lateral calf. Using silver collagen to both of these areas 3/16-Left heel remains closed. Area on right foot is closed. Left lateral calf above the lateral malleolus open wound requiring debridement with easy bleeding. Left dorsal wound proximal to first toe also debrided. Left ischial area open new. Patient has been using Prisma with wrapping every 3 days. Dermatology appointment is apparently tomorrow.Patient has completed his terbinafine 2-week course with some apparent improvement according to him, there is still flaking and dry skin in his foot on the left 3/23; area on the right foot is reopened. The area on the left anterior foot is about the same still [name] very necrotic adherent surface. He still has the area on the left leg and reopening is on the left buttock. He apparently saw dermatology although I do not have [name] note. According to the patient who is usually fairly well informed they did not have any good ideas. Put him on oral terbinafine which she is been on before. 3/30; using silver collagen to all wounds. Apparently his dermatologist put him on doxycycline and rifampin presumably some culture grew staph. I do not have this result. He remains on terbinafine although I have used terbinafine on  him before 4/6; patient has had [name] fairly substantial reopening on the right foot between the first and second toes. He is finished his terbinafine and I believe is on doxycycline and rifampin still as prescribed by dermatology. We have been using silver collagen to all his wounds although the patient reports that he thinks silver alginate does better on the wounds on his buttock. 4/13; the area on his left lateral calf about the same size but it did not require debridement. ooLeft dorsal foot just proximal to the webspace between the first and second toes is about the same. Still nonviable  surface. I note some superficial bronze discoloration of the dorsal part of his foot ooRight dorsal foot just proximal to the first and second toes also looks about the same. I still think there may be the same discoloration I noted above on the left ooLeft buttock wound looks about the same 4/20; left lateral calf appears to be gradually contracting using silver collagen. 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 The left dorsal foot wound is debrided of tightly adherent necrotic debris and really cleans up quite nicely. The right area is worse with expansion. I did not debride this it is now over the base of the second toe ooThe area on his left buttock is smaller no debridement is required using silver collagen 5/4; left calf continues to make good progress. ooHe arrives with erythema around the wounds on his dorsal foot which even extends to the plantar aspect. Very concerning for coexistent infection. He is finished the erythromycin I gave him for possible erythrasma this does not seem to have helped. ooThe area on the left foot is about the same base of the dorsal toes ooIs area on the buttock looks improved on the left 5/11; left calf and left buttock continued to make good progress. Left foot is about the same to slightly improved. ooMajor problem is on the right foot. He has not had an x-ray. Deep tissue culture I did last week showed both Enterobacter and E. coli. I did not change the doxycycline I put him on empirically although neither 1 of these were plated to doxycycline. He arrives today with the erythema looking worse on both the dorsal and plantar foot. Macerated skin on the bottom of the foot. he has not been systemically unwell 5/18-Patient returns at 1 week, left calf wound appears to be making some progress, left buttock wound appears slightly worse than last time, left foot wound looks slightly better, right  foot redness is marginally better. X-ray of both feet show no air or evidence of osteomyelitis. Patient is finished his Omnicef and terbinafine. He continues to have macerated skin on the bottom of the left foot as well as right 5/26; left calf wound is better, left buttock wound appears to have multiple small superficial open areas with surrounding macerated skin. X-rays that I did last time showed no evidence of osteomyelitis in either foot. He is finished cefdinir and doxycycline. I do not think that he was on terbinafine. He continues to have [name] large superficial open area on the right foot anterior dorsal and slightly between the first and second toes. I did send him to dermatology 2 months ago or so wondering about whether they would do [name] fungal scraping. I do not believe they did but did do [name] culture. We have been using silver alginate to the toe areas, he has been using antifungals at home topically either ketoconazole  or Lotrisone. We are using silver collagen on the left foot, silver alginate on the right, silver collagen on the left lateral leg and silver alginate on the left buttock 6/1; left buttock area is healed. We have the left dorsal foot, left lateral leg and right dorsal foot. We are using silver alginate to the areas on both feet and silver collagen to the area on his left lateral calf 6/8; the left buttock apparently reopened late last week. He is not really sure how this happened. He is tolerating the terbinafine. Using silver alginate to all wounds 6/15; left buttock wound is larger than last week but still superficial. ooCame in the clinic today with [name] report of purulence from the left lateral leg I did not identify any infection ooBoth areas on his dorsal feet appear to be better. He is tolerating the terbinafine. Using silver alginate to all wounds 6/22; left buttock is about the same this week, left calf quite [name] bit better. His left foot is about the same however he comes  in with erythema and warmth in the right forefoot once again. Culture that I gave him in the beginning of May showed Enterobacter and E. coli. I gave him doxycycline and things seem to improve although neither 1 of these organisms was specifically plated. 6/29; left buttock is larger and dry this week. Left lateral calf looks to me to be improved. Left dorsal foot also somewhat improved right foot completely unchanged. The erythema on the right foot is still present. He is completing the Ceftin dinner that I gave him empirically [see discussion above.) 7/6 - All wounds look to be stable and perhaps improved, the left buttock wound is slightly smaller, per patient bleeds easily, completed ceftin, the right foot redness is less, he is on terbinafine 7/13; left buttock wound about the same perhaps slightly narrower. Area on the left lateral leg continues to narrow. Left dorsal foot slightly smaller right foot about the same. We are using silver alginate on the right foot and Hydrofera Blue to the areas on the left. Unna boot on the left 2 layer compression on the right 7/20; left buttock wound absolutely the same. Area on lateral leg continues to get better. Left dorsal foot require debridement as did the right no major change in the 7/27; left buttock wound the same size necrotic debris over the surface. The area on the lateral leg is closed once again. His left foot looks better right foot about the same although there is some involvement now of the posterior first second toe area. He is still on terbinafine which I have given him for [name] month, not certain [name] centimeter major change 06/25/19-All wounds appear to be slightly improved according to report, left buttock wound looks clean, both foot wounds have minimal to no debris the right dorsal foot has minimal slough. We are using Hydrofera Blue to the left and silver alginate to the right foot and ischial wound. 8/10-Wounds all appear to be around the  same, the right forefoot distal part has some redness which was not there before, however the wound looks clean and small. Ischial wound looks about the same with no changes 8/17; his wound on the left lateral calf which was his original chronic venous insufficiency wound remains closed. Since I last saw him the areas on the left dorsal foot right dorsal foot generally appear better but require debridement. The area on his left initial tuberosity appears somewhat larger to me perhaps hyper granulated and  bleeds very easily. We have been using Hydrofera Blue to the left dorsal foot and silver alginate to everything else 8/24; left lateral calf remains closed. The areas on his dorsal feet on the webspace of the first and second toes bilaterally both look better. The area on the left buttock which is the pressure ulcer stage II slightly smaller. I change the dressing to Hydrofera Blue to all areas 8/31; left lateral calf remains closed. The area on his dorsal feet bilaterally look better. Using Hydrofera Blue. Still requiring debridement on the left foot. No change in the left buttock pressure ulcers however 9/14; left lateral calf remains closed. Dorsal feet look quite [name] bit better than 2 weeks ago. Flaking dry skin also [name] lot better with the ammonium lactate I gave him 2 weeks ago. The area on the left buttock is improved. He states that his Roho cushion developed [name] leak and he is getting [name] new one, in the interim he is offloading this vigorously 9/21; left calf remains closed. Left heel which was [name] possible DTI looks better this week. He had macerated tissue around the left dorsal foot right foot looks satisfactory and improved left buttock wound. I changed his dressings to his feet to silver alginate bilaterally. Continuing Hydrofera Blue on the left buttock. 9/28 left calf remains closed. Left heel did not develop anything [possible DTI] dry flaking skin on the left dorsal foot. Right foot looks  satisfactory. Improved left buttock wound. We are using silver alginate on his feet Hydrofera Blue on the buttock. I have asked him to go back to the Lotrisone on his feet including the wounds and surrounding areas 10/5; left calf remains closed. The areas on the left and right feet about the same. [name] lot of this is epithelialized however debris over the remaining open areas. He is using Lotrisone and silver alginate. The area on the left buttock using Hydrofera Blue 10/26. Patient has been out for 3 weeks secondary to Covid concerns. He tested negative but I think his wife tested positive. He comes in today with the left foot substantially worse, right foot about the same. Even more concerning he states that the area on his left buttock closed over but then reopened and is considerably deeper in one aspect than it was before [stage III wound] 11/2; left foot really about the same as last week. Quarter sized wound on the dorsal foot just proximal to the first second toes. Surrounding erythema with areas of denuded epithelium. This is not really much different looking. Did not look like cellulitis this time however. ooRight foot area about the same.. We have been using silver alginate alginate on his toes ooLeft buttock still substantial irritated skin around the wound which I think looks somewhat better. We have been using Hydrofera Blue here. 11/9; left foot larger than last week and [name] very necrotic surface. Right foot I think is about the same perhaps slightly smaller. Debris around the circumference also addressed. Unfortunately on the left buttock there is been [name] decline. Satellite lesions below the major wound distally and now [name] an additional one posteriorly we have been using Hydrofera Blue but I think this is [name] pressure issue 11/16; left foot ulcer dorsally again [name] very adherent necrotic surface. Right foot is about the same. Not much change in the pressure ulcer on his left buttock. 11/30;  left foot ulcer dorsally basically the same as when I saw him 2 weeks ago. Very adherent fibrinous debris on the wound  surface. Patient reports [name] lot of drainage as well. 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 We have been using Iodoflex, patient changed back to alginate because of the drainage. Area on his right dorsal foot really looks benign with [name] healthier surface certainly [name] lot better than on the left. Left buttock wounds all improved using Hydrofera Blue 12/7; left dorsal foot again no improvement. Tightly adherent debris. PCR culture I did last week only showed likely skin contaminant. I have gone ahead and done [name] punch biopsy of this which is about the last thing in terms of investigations I can think to do. He has known venous insufficiency and venous hypertension and this could be the issue here. The area on the right foot is about the same left buttock slightly worse according to our intake nurse secondary to [facility] Blue sticking to the wound 12/14; biopsy of the left foot that I did last time showed changes that could be related to wound healing/chronic stasis dermatitis phenomenon no neoplasm. We have been using silver alginate to both feet. I change the one on the left today to Sorbact and silver alginate to his other 2 wounds 12/28; the patient arrives with the following problems; ooMajor issue is the dorsal left foot which continues to be [name] larger deeper wound area. Still with [name] completely nonviable surface ooParadoxically the area mirror image on the right on the right dorsal foot appears to be getting better. ooHe had some loss of dry denuded skin from the lower part of his original wound on the left lateral calf. Some of this area looked [name] little vulnerable and for this reason we put him in wrap that on this side this week ooThe area on his left buttock is larger. He still has the erythematous circular area which I think  is [name] combination of pressure, sweat. This does not look like cellulitis or fungal dermatitis 11/26/2019; -Dorsal left foot large open wound with depth. Still debris over the surface. Using Sorbact ooThe area on the dorsal right foot paradoxically has closed over [facility] has [name] reopening on the left ankle laterally at the base of his original wound that extended up into the calf. This appears clean. ooThe left buttock wound is smaller but with very adherent necrotic debris over the surface. We have been using silver alginate here as well The patient had arterial studies done in 2017. He had biphasic waveforms at the dorsalis pedis and posterior tibial bilaterally. ABI in the left was 1.17. Digit waveforms were dampened. He has slight spasticity in the great toes I do not think [name] TBI would be possible 1/11; the patient comes in today with [name] sizable reopening between the first and second toes on the right. This is not exactly in the same location where we have been treating wounds previously. According to our intake nurse this was actually fairly deep but 0.6 cm. The area on the left dorsal foot looks about the same the surface is somewhat cleaner using Sorbact, his MRI is in 2 days. We have not managed yet to get arterial studies. The new reopening on the left lateral calf looks somewhat better using alginate. The left buttock wound is about the same using alginate 1/18; the patient had his ARTERIAL studies which were quite normal. ABI in the right at 1.13 with triphasic/biphasic waveforms on the left ABI 1.06 again with triphasic/biphasic waveforms. It would not have been possible to have done [name] toe brachial  index because of spasticity. We have been using Sorbac to the left foot alginate to the rest of his wounds on the right foot left lateral calf and left buttock 1/25; arrives in clinic with erythema and swelling of the left forefoot worse over the first MTP area. This extends laterally dorsally and  but also posteriorly. Still has an area on the left lateral part of the lower part of his calf wound it is eschared and clearly not closed. ooArea on the left buttock still with surrounding irritation and erythema. ooRight foot surface wound dorsally. The area between the right and first and second toes appears better. 2/1; ooThe left foot wound is about the same. Erythema slightly better I gave him [name] week of doxycycline empirically ooRight foot wound is more extensive extending between the toes to the plantar surface ooLeft lateral calf really no open surface on the inferior part of his original wound however the entire area still looks vulnerable ooAbsolutely no improvement in the left buttock wound required debridement. 2/8; the left foot is about the same. Erythema is slightly improved I gave him clindamycin last week. ooRight foot looks better he is using Lotrimin and silver alginate ooHe has [name] breakdown in the left lateral calf. Denuded epithelium which I have removed ooLeft buttock about the same were using Hydrofera Blue 2/15; left foot is about the same there is less surrounding erythema. Surface still has tightly adherent debris which I have debriding however not making any progress ooRight foot has [name] substantial wound on the medial right second toe between the first and second webspace. ooStill an open area on the left lateral calf distal area. ooButtock wound is about the same 2/22; left foot is about the same less surrounding erythema. Surface has adherent debris. Polymen Ag Right foot area significant wound between the first and second toes. We have been using silver alginate here Left lateral leg polymen Ag at the base of his original venous insufficiency wound ooLeft buttock some improvement here 3/1; ooRight foot is deteriorating in the first second toe webspace. Larger and more substantial. We have been using silver alginate. ooLeft dorsal foot about the same  markedly adherent surface debris using PolyMem Ag ooLeft lateral calf surface debris using PolyMem AG ooLeft buttock is improved again using PolyMem Ag. ooHe is completing his terbinafine. The erythema in the foot seems better. He has been on this for 2 weeks 3/8; no improvement in any wound area in fact he has [name] small open area on the dorsal midfoot which is new this week. He has not gotten his foot x-rays yet 3/15; his x-rays were both negative for osteomyelitis of both feet. No major change in any of his wounds on the extremities however his buttock wounds are better. We have been using polymen on the buttocks, left lower leg. Iodoflex on the left foot and silver alginate on the right 3/22; arrives in clinic today with the 2 major issues are the improvement in the left dorsal foot wound which for once actually looks healthy with [name] nice healthy wound surface without debridement. Using Iodoflex here. Unfortunately on the left lateral calf which is in the distal part of his original wound he came to the clinic here for there was purulent drainage noted some increased breakdown scattered around the original area and [name] small area proximally. We we are using polymen here will change to silver alginate today. His buttock wound on the left is better and I think the area on the  right first second toe webspace is also improved 3/29; left dorsal foot looks better. Using Iodoflex. Left ankle culture from deterioration last time grew E. coli, Enterobacter and Enterococcus. I will give him [name] course of cefdinir although that will not cover Enterococcus. The area on the right foot in the webspace of the first and second toe lateral first toe looks better. The area on his buttock is about healed Vascular appointment is on April 21. This is to look at his venous system vis--vis continued breakdown of the wounds on the left including the left lateral leg and left dorsal foot he. He has had previous ablations on  this side 4/5; the area between the right first and second toes lateral aspect of the first toe looks better. Dorsal aspect of the left first toe on the left foot also improved. Unfortunately the left lateral lower leg is larger and there is [name] second satellite wound superiorly. The usual superficial abrasions on the left buttock overall better but certainly not closed 4/12; the area between the right first and second toes is improved. Dorsal aspect of the left foot also slightly smaller with [name] vibrant healthy looking surface. No real change in the left lateral leg and the left buttock wound is healed He has an unaffordable co-pay for Apligraf. Appointment with vein and vascular with regards to the left leg venous part of the circulation is on 4/21 4/19; we continue to see improvement in all wound areas. Although this is minor. He has his vascular appointment on 4/21. The area on the left buttock has not reopened although right in the center of this area the skin looks somewhat threatened 4/26; the left buttock is unfortunately reopened. In general his left dorsal foot has [name] healthy surface and looks somewhat smaller although it was not measured as such. The area between his first and second toe webspace on the right as [name] small wound against the first toe. The patient saw vascular surgery. The real question I was asking was about the small saphenous vein on the left. He has previously ablated left greater saphenous vein. Nothing further was commented on on the left. Right greater saphenous vein without reflux at the saphenofemoral junction or proximal thigh there was no indication for ablation of the right greater saphenous vein duplex was negative for DVT bilaterally. They did not think there was anything from [name] vascular surgery point of view that could be offered. They ABIs within normal limits 5/3; only small open area on the left buttock. The area on the left lateral leg which was his original  venous reflux is now 2 wounds both which look clean. We are using Iodoflex on the left dorsal foot which looks healthy and smaller. He is down to [name] very tiny area between the right first and second toes, using silver alginate 5/10; all of his wounds appear better. We have much better edema control in 4 layer compression on the left. This may be the factor that is allowing the left foot and left lateral calf to heal. He has external compression garments at home 04/14/20-All of his wounds are progressing well, the left forefoot is practically closed, left ischium appears to be about the same, right toe webspace is also smaller. The left lateral leg is about the same, continue using Hydrofera Blue to this, silver alginate to the ischium, Iodoflex to the toe space on the right 6/7; most of his wounds outside of the left buttock are doing well. The area on  the left lateral calf and left dorsal foot are smaller. The area on the right foot in between the first and second toe webspace is barely visible although he still says there is some drainage here is the only reason I did not heal this out. ooUnfortunately the area on the left buttock almost looks like he has [name] skin tear from tape. He has open wound and then [name] large flap of skin that we are trying to get adherence over an area just next to the remaining wound 6/21; 2 week follow-up. I believe is been here for nurse visits. Miraculously the area between his first and second toes on the left dorsal foot is closed over. Still open on the right first second web space. The left lateral calf has 2 open areas. Distally this is more superficial. The proximal area had [name] little more depth and required debridement of adherent necrotic material. His buttock wound is actually larger we have been using silver alginate here 6/28; the patient's area on the left foot remains closed. Still open wet area between the first and second toes on the right and also extending into  the plantar aspect. We have been using silver alginate in this location. He has 2 areas on the left lower leg part of his original long wounds which I think are better. We have been using Hydrofera Blue here. Hydrofera Blue to the left buttock which is stable 7/12; left foot remains closed. Left ankle is closed. May be [name] small area between his right first and second toes the only truly open area is on the left buttock. We have been using Hydrofera Blue here 7/19; patient arrives with marked deterioration especially in the left foot and ankle. We did not put him in [name] compression wrap on the left last week in fact he wore his juxta lite stockings on either side although he does not have an underlying stocking. He has [name] reopening on the left dorsal foot, left lateral ankle and [name] new area on the right dorsal ankle. More worrisome is the degree of erythema on the left foot extending on the lateral foot into the lateral lower leg on the left 7/26; the patient had erythema and drainage from the lateral left ankle last week. Culture of this grew MRSA resistant to doxycycline and clindamycin which are the 2 antibiotics we usually use with this patient who has multiple antibiotic allergies including linezolid, trimethoprim sulfamethoxazole. I had give him an empiric doxycycline and he comes in the area certainly looks somewhat better although it is blotchy in his lower leg. He has not been systemically unwell. He has had areas on the left dorsal foot which is [name] reopening, chronic wounds on the left lateral ankle. Both of these I think are secondary to chronic venous insufficiency. The area between his first and second toes is closed as far as I can tell. He had [name] new wrap injury on the right dorsal ankle last week. Finally he has an area on the left buttock. We have been using silver alginate to everything except the left buttock we are using Hydrofera Blue 06/30/20-Patient returns at 1 week, has been given [name]  sample dose pack of [id number] which is [name] tetracycline derivative [omadacycline], patient has completed those, we have been using silver alginate to almost all the wounds except the left ischium where we are using Hydrofera Blue all of them look better 8/16; since I last saw the patient he has been doing well. The area  on the left buttock, left lateral ankle and left foot are all closed today. He has completed the [place] I gave him last time and tolerated this well. He still has open areas on the right dorsal ankle and in the right first second toe area which we are using silver alginate. 8/23; we put him in his bilateral external compression stockings last week as he did not have anything open on either leg except for concerning area between the right first and second toe. He comes in today with an area on the left dorsal foot slightly more proximal than the original wound, the left lateral foot but this is actually [name] continuation of the area he had on the left lateral ankle from last time. As well he is opened up on the left buttock again. 8/30; comes in today with things looking [name] lot better. The area on the left lower ankle has closed down as has the left foot but with eschar in both areas. The area on the dorsal right ankle is also epithelialized. Very little remaining of the left buttock wound. We have been using silver alginate on all wound areas 9/13; the area in the first second toe webspace on the right has fully epithelialized. He still has some vulnerable epithelium on the right and the ankle and the dorsal foot. He notes weeping. He is using his juxta lite stocking. On the left again the left dorsal foot is closed left lateral ankle is closed. We went to the juxta lite stocking here as well. ooStill vulnerable in the left buttock although only 2 small open areas remain here 9/27; 2-week follow-up. We did not look at his left leg but the patient says everything is closed. He is [name] bit disturbed  by the amount of edema in his left foot he is using juxta lite stockings but asking about over the toes stockings which would be 30/40, will talk to him next time. According to him there is no open wound on either the left foot or the left ankle/calf He has an open area on the dorsal right calf which I initially point [name] wrap injury. He has superficial remaining wound on the left ischial tuberosity been using silver alginate although he says this sticks to the wound 10/5; we gave him 2-week follow-up but he called yesterday expressing some concerns about his right foot right ankle and the left buttock. He came in early. There is still no open areas on the left leg and that still in his juxta lite stocking 10/11; he only has 1 small area on the left buttock that remains measuring millimeters 1 mm. Still has the same irritated skin in this area. We recommended zinc oxide when this eventually closes and pressure relief is meticulously is he can do this. He still has an area on the dorsal part of his right first through third toes which is [name] bit irritated and still open and on the dorsal ankle near the crease of the ankle. We have been using silver alginate and using his own stocking. He has nothing open on the left leg or foot 10/25; 2-week follow-up. Not nearly as good on the left buttock as I was hoping. For open areas with 5 looking threatened small. He has the erythematous irritated chronic skin in this area. oo1 area on the right dorsal ankle. He reports this area bleeds easily ooRight dorsal foot just proximal to the base of his toes ooWe have been using silver alginate. 11/8; 2-week follow-up.  Left buttock is about the same although I do not think the wounds are in the same location we have been using silver alginate. I have asked him to use zinc oxide on the skin around the wounds. ooHe still has [name] small area on the right dorsal ankle he reports this bleeds easily ooRight dorsal foot just  proximal to the base of the toes does not have anything open although the skin is very dry and scaly ooHe has [name] new opening on the nailbed of the left great toe. Nothing on the left ankle 11/29; 3-week follow-up. Left buttock has 2 open areas. And washing of these wounds today started bleeding easily. Suggesting very friable tissue. We have been using silver alginate. Right dorsal ankle which I thought was initially [name] wrap injury we have been using silver alginate. Nothing open between the toes that I can see. He states the area on the left dorsal toe nailbed healed after the last visit in 2 or 3 days 12/13; 3-week follow-up. His left buttock now has 3 open areas but the original 2 areas are smaller using polymen here. Surrounding skin looks better. The right dorsal ankle is closed. He has [name] small opening on the right dorsal foot at the level of the third toe. In general the skin looks better here. He is wearing his juxta lite stocking on the left leg says there is nothing open 11/24/2020; 3 weeks follow-up. His left buttock still has the 3 open areas. We have been using polymen but due to lack of response he changed to [facility] area. Surrounding skin is dry erythematous and irritated looking. There is no evidence of infection either bacterial or fungal however there is loss of surface epithelium ooHe still has very dry skin in his foot causing irritation and erythema on the dorsal part of his toes. This is not responded to prolonged courses of antifungal simply looks dry and irritated 1/24; left buttock area still looks about the same he was unable to find the triad ointment that we had suggested. The area on the right lower leg just above the dorsal ankle has reopened and the areas on the right foot between the first second and second third toes and scaling on the bottom of the foot has been about the same for quite some time now. been using silver alginate to all wound areas 2/7; left  buttock wound looked quite good although not much smaller in terms of surface area surrounding skin looks better. Only [name] few dry flaking areas on the right foot in between the first and second toes the skin generally looks better here [ammonium lactate]. Finally the area on the right dorsal ankle is closed 2/21; ooThere is no open area on the right foot even between the right first and second toe. Skin around this area dorsally and plantar aspects look better. ooHe has [name] reopening of the area on the right ankle just above the crease of the ankle dorsally. I continue to think that this is probably friction from spasms may be even this time with his stocking under the compression stockings. ooWounds on his left buttock look about the same there [name] couple of areas that have reopened. He has [name] total square area of loss of epithelialization. This does not look like infection it looks like [name] contact dermatitis but I just cannot determine to what 3/14; there is nothing on the right foot between the first and second toes this was carefully inspected under illumination. Some  chronic irritation on the dorsal part of his foot from toes 1-3 at the base. Nothing really open here substantially. Still has an area on the right foot/ankle that is actually larger and hyper granulated. His buttock area on the left is just about closed however he has chronic inflammation with loss of the surface epithelial layer 3/28; 2-week follow-up. In clinic today with [name] new wound on the left anterior mid tibia. Says this happened about 2 weeks ago. He is not really sure how wonders about the spasticity of his legs at night whether that could have caused this other than that he does not have [name] good idea. He has been using topical antibiotics and silver alginate. The area on his right dorsal ankle seems somewhat better. ooFinally everything on his left buttock is closed. 4/11; 2-week follow-up. All of his wounds are better except  for the area over the ischium and left buttock which have opened up widely again. At least part of this is covered in necrotic fibrinous material another part had rolled nonviable skin. The area on the right ankle, left anterior mid tibia are both [name] lot better. He had no open wounds on either foot including the areas between the first and second toes 4/25; patient presents for 2-week follow-up. He states that the wounds are overall stable. He has no complaints today and states he is using Hydrofera Blue to open wounds. 5/9; have not seen this man in over [name] month. For my memory he has open areas on the left mid tibia and right ankle. T oday he has new open area on the right dorsal foot which we have not had [name] problem with recently. He has the sustained area on the left buttock He is also changed his insurance at the beginning of the year [employer]. We will need prior authorizations for debridement 5/23; patient presents for 2-week follow-up. He has prior authorizations for debridement. He denies any issues in the past 2 weeks with his wound care. He has been using Hydrofera Blue to all the wounds. He does report [name] circular rash to the upper left leg that is new. He denies acute signs of infection. 6/6; 2-week follow-up. The patient has open wounds on the left buttock which are worse than the last time I saw this about [name] month ago. He also has [name] new area to me on the left anterior mid tibia with some surrounding erythema. The area on the dorsal ankle on the right is closed but I think this will be [name] friction injury every time this area is exposed to either our wraps or his compression stockings caused by unrelenting spasms in this leg. 6/20; 2-week follow-up. oo The patient has open wounds on the left buttock which is about the same. Using [facility] here. - The left mid tibia has [name] static amount of surrounding erythema. Also [name] raised area in the center. We have been using Hydrofera Blue  here. ooooFinally he has broken down in his dorsal right foot extending between the first and second toes and going to the base of the first and second toe webspace. I have previously assumed that this was severe venous hypertension 7/5; 2-week follow-up oo The left buttock wound actually looks better. We are using Hydrofera Blue. He has extensive skin irritation around this area and I have not really been able to get that any better. I have tried Lotrisone i.e. antifungals and steroids. More most recently we have just been using Coloplast really  looks about the same. oo The left mid tibia which was new last week culture to have very resistant staph aureus. Not only methicillin-resistant but doxycycline resistant. The patient has [name] plethora of antibiotic allergies including sulfa, linezolid. I used topical bacitracin on this but he has not started this yet. oo In addition he has an expanding area of erythema with [name] wound on the dorsal right foot. I did [name] deep tissue culture of this area today 7/12; oo Left buttock area actually looks better surrounding skin also looks less irritated. oo Left mid tibia looks about the same. He is using bacitracin this is not worse oo Right dorsal foot looks about the same as well. oo The left first toe also looks about the same 7/19; left buttock wound continues to improve in terms of open areas oo Left mid tibia is still concerning amount of swelling he is using bacitracin oo Dorsal left first toe somewhat smaller oo Right dorsal foot somewhat smaller 7/25; left buttock wound actually continues to improve oo Left mid tibia area has less swelling. I gave him all my samples of new Nuzyra. This seems to have helped although the wound is still open it. His abrasion closed by here oo Left dorsal great toe really no better. Still [name] very nonviable surface oo Right dorsal foot perhaps some better. oo We have been using bacitracin and silver nitrate to  the areas on his lower legs and Hydrofera Blue to the area on the buttock. 8/16 oo Disappointed that his left buttock wound is actually more substantial. Apparently during the last nurse visit these were both very small. He has continued irritation to [name] large area of skin on his buttock. I have never been able to totally explain this although I think it some combination of the way he sits, pressure, moisture. He is not incontinent enough to contribute to this. oo Left dorsal great toe still fibrinous debris on the surface that I have debrided today oo Large area across the dorsal right toes. oo The area on the left anterior mid tibia has less swelling. He completed the [place]. This does not look infected although the tissue is still fried 8/30; 2-week follow-up. oo Left buttock areas not improved. We used Hydrofera Blue on this. Weeping wet with the surrounding erythema that I have not been able to control even with Lotrisone and topical Coloplast oo Left dorsal great toe looks about the same oo More substantial area again at the base of his toes on the left which is new this week. oo Area across the dorsal right toes looks improved oo The left anterior mid tibia looks like it is trying to close 9/13; 2-week follow-up. Using silver alginate on all of his wounds. The left dorsal foot does not look any better. He has the area on the dorsal toe and also the areas at the base of all of his toes 1 through 3. On the right foot he has [name] similar pattern in [name] similar area. He has the area on his left mid tibia that looks fairly healthy. Finally the area on his left buttock looks somewhat bette 9/20; culture I did of the left foot which was [name] deep tissue culture last time showed E. coli he has erythema around this wound. Still [name] completely necrotic surface. His right dorsal foot looks about the same. He has [name] very friable surface to the left anterior mid tibia. Both buttock wounds look better. We  have been using silver alginate  to all wounds 10/4; he has completed the cephalexin that I gave him last time for the left foot. He is using topical gentamicin under silver alginate silver alginate being applied to all the wounds. Unfortunately all the wounds look irritated on his dorsal right foot dorsal left foot left mid tibia. I wonder if this could be [name] silver allergy. I am going to change him to [facility] on the lower extremity. The skin on the left buttock and left posterior thigh still flaking dry and irritated. This has continued no matter what I have applied topically to this. He has [name] solitary open wound which by itself does not look too bad however the entire area of surrounding skin does not change no matter what we have applied here 10/18; the area on the left dorsal foot and right dorsal foot both look better. The area on the right extends into the plantar but not between his toes. We have been using silver alginate. He still has [name] rectangular erythematous area around the area on the left tibia. The wound itself is very small. Finally everything on his left buttock looks [name] little larger the skin is erythematous 11/15; patient comes in with the left dorsal and right dorsal foot distally looking somewhat better. Still nonviable surface on the left foot which required debridement. He still has the area on the left anterior mid tibia although this looks somewhat better. He has [name] new area on the right lateral lower leg just above the ankle. Finally his left buttock looks terrible with multiple superficial open wounds geometric square shaped area of chronic erythema which I have not been able to sort through 11/29; right dorsal foot and left dorsal foot both look somewhat better. No debridement required. He has the fragile area on the left anterior mid tibia this looks and continues to look somewhat better. Right lateral lower leg just above the ankle we identified last time also looks  better. In general the area on his left buttock looks improved. We are using Hydrofera Blue to all wound areas 12/13; right dorsal foot looks better. The area on the right lateral leg is healed. Left dorsal foot has 2 open areas both of which require debridement. The fragile area on the left anterior mid tibial looks better. Smaller area on his buttocks. Were using Hydrofera Blue 1/10; patient comes in with everything looking slightly larger and/or worse. This includes his left buttock, reopening of the left mid tibia, larger areas on the left dorsal foot and what looks to be [name] cellulitis on the right dorsal and plantar foot. We have been using Hydrofera Blue on all wounds. 1/17; right dorsal foot distally looks better today. The left foot has 2 open wounds that are about the same surrounding erythema. 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 No malignancy they did not stain for fungus 1/24; his right distal foot is not closed dry and scaly but the wound looks like it is contracting. I did not debride anything here. Problem on his left dorsal foot with expanding erythema. Apparently there were problems last week getting the Elesa Hacker however it is now available at the [employer] but [name] week later. He is using ketoconazole and Coloplast to the left buttock along with Hydrofera Blue this actually looks quite [name] bit better today. 1/31; right dorsal foot again is dry and scaly but looking to contract.  He has been using [name] moisturizer on his feet at my request but he is not sure which 1. The left dorsal foot wounds look about the same there is erythema here that I marked last week however after course of Nuzyra it certainly is not any better but not any worse either. Finally on his left buttock the skin continues to look better he has the original wound but [name] new  substantial area towards the gluteal cleft. Almost like [name] skin tear. I used scissors to remove skin and subcutaneous tissue here silver nitrate and direct pressure 2/7; right dorsal foot. This does not look too much different from last week. Some erythema skin dry and scaly. No debridement. Left dorsal great toe again still not much improvement. I did remove flaking dry skin and callus from around the edge. Finally on his left buttock. The skin is somewhat better in the periwound. Surface wounds are superficial somewhat better than last week. 01/26/2022: Is [name] little bit of [name] mystery as to why his wounds fail to respond to treatments and actually seem to get worse. This is my first encounter with this patient. He was previously followed by Dr. Dellia Nims. Based upon my review of the chart, it seems that there is [name] little bit of [name] mystery as to why his wounds do not respond as anticipated to the interventions applied and sometimes even get worse. Biopsies have been performed and he was seen by dermatology in [place], but that did not shed any light on the matter. T oday, his gluteal wound is larger, with substantial drainage, rather malodorous. The food wounds are not terrible, but he has [name] lot of callus and scaly skin around these. He is currently getting silver alginate on the gluteal wound, with idodoflex to the feet. He is using lotrisone on his legs for the dry, scaly skin. Patient History Information obtained from Patient. Family History Diabetes - Father, Hypertension - Mother, No family history of Cancer, Heart Disease, Hereditary Spherocytosis, Kidney Disease, Lung Disease, Stroke, Thyroid Problems, Tuberculosis. Social History Former smoker, Marital Status - Married, Alcohol Use - Moderate, Drug Use - No History, Caffeine Use - Daily. Medical History Ear/Nose/Mouth/Throat Denies history of Chronic sinus problems/congestion, Middle ear problems Hematologic/Lymphatic Denies history of Anemia,  Hemophilia, Human Immunodeficiency Virus, Lymphedema, Sickle Cell Disease Respiratory Patient has history of Sleep Apnea - does not tolerate cpap Denies history of Aspiration, Asthma, Chronic Obstructive Pulmonary Disease (COPD), Pneumothorax, Tuberculosis Cardiovascular Patient has history of Hypertension - lisinopril HCTZ Denies history of Angina, Arrhythmia, Congestive Heart Failure, Coronary Artery Disease, Deep Vein Thrombosis, Hypotension, Myocardial Infarction, Peripheral Arterial Disease, Peripheral Venous Disease, Phlebitis, Vasculitis Gastrointestinal Denies history of Cirrhosis , Colitis, Crohnoos, Hepatitis [name], Hepatitis B, Hepatitis C Endocrine Denies history of Type I Diabetes, Type II Diabetes Genitourinary Denies history of End Stage Renal Disease Immunological Denies history of Lupus Erythematosus, Raynaudoos, Scleroderma Integumentary (Skin) Denies history of History of Burn Musculoskeletal Denies history of Gout, Rheumatoid Arthritis, Osteoarthritis, Osteomyelitis Neurologic Patient has history of Paraplegia Denies history of Dementia, Neuropathy, Quadriplegia, Seizure Disorder Oncologic Denies history of Received Chemotherapy, Received Radiation Psychiatric Denies history of Anorexia/bulimia, Confinement Anxiety Hospitalization/Surgery History - cellulitis in leg. - left leg vein ablation. Objective Constitutional Tachycardic, asymptomatic.Marland Kitchen No acute distress. Vitals Time Taken: 8:07 AM, Height: 70 in, Weight: 216 lbs, BMI: 31, Temperature: 98.4 F, Pulse: 117 bpm, Respiratory Rate: 18 breaths/min, Blood Pressure: 126/66 mmHg. Respiratory Normal work of breathing on room air. General Notes: 01/26/2022: Wound examooI removed  callus and fibrinous exudate from both the foot wounds. These are largely unchanged from prior visits. The left buttock is macerated with multiple open skin areas. Malodorous drainage present. Slough debrided. Integumentary (Hair,  Skin) Wound #41R status is Open. Original cause of wound was Gradually Appeared. The date acquired was: 03/16/2020. The wound has been in treatment 97 weeks. The wound is located on the Left Ischium. The wound measures 7cm length x 5.4cm width x 0.2cm depth; 29.688cm^2 area and 5.938cm^3 volume. There is Fat Layer (Subcutaneous Tissue) exposed. There is no tunneling or undermining noted. There is [name] medium amount of serosanguineous drainage noted. The wound margin is distinct with the outline attached to the wound base. There is small (1-33%) red granulation within the wound bed. There is [name] large (67-100%) amount of necrotic tissue within the wound bed including Adherent Slough. Wound #52 status is Open. Original cause of wound was Gradually Appeared. The date acquired was: 03/27/2021. The wound has been in treatment 43 weeks. The wound is located on the Right,Dorsal Foot. The wound measures 0.5cm length x 1.8cm width x 0.1cm depth; 0.707cm^2 area and 0.071cm^3 volume. There is Fat Layer (Subcutaneous Tissue) exposed. There is no tunneling or undermining noted. There is [name] medium amount of serosanguineous drainage noted. The wound margin is distinct with the outline attached to the wound base. There is small (1-33%) pink, pale granulation within the wound bed. There is [name] large (67- 100%) amount of necrotic tissue within the wound bed including Adherent Slough. Wound #56 status is Open. Original cause of wound was Gradually Appeared. The date acquired was: 07/11/2021. The wound has been in treatment 27 weeks. The wound is located on the Left,Dorsal Foot. The wound measures 2.4cm length x 0.7cm width x 0.2cm depth; 1.319cm^2 area and 0.264cm^3 volume. There is no tunneling or undermining noted. There is [name] medium amount of serosanguineous drainage noted. The wound margin is distinct with the outline attached to the wound base. There is medium (34-66%) pink, pale granulation within the wound bed. There is [name]  medium (34-66%) amount of necrotic tissue within the wound bed including Adherent Slough. Wound #57 status is Open. Original cause of wound was Gradually Appeared. The date acquired was: 08/25/2021. The wound has been in treatment 22 weeks. The wound is located on the [name]. The wound measures 0.3cm length x 0.4cm width x 0.1cm depth; 0.094cm^2 area and 0.009cm^3 volume. There is Fat Layer (Subcutaneous Tissue) exposed. There is no tunneling or undermining noted. There is [name] medium amount of serosanguineous drainage noted. There is medium (34-66%) pink granulation within the wound bed. There is [name] medium (34-66%) amount of necrotic tissue within the wound bed including Adherent Slough. Wound #61 status is Open. Original cause of wound was Gradually Appeared. The date acquired was: 01/10/2022. The wound has been in treatment 2 weeks. The wound is located on the Right,Medial Lower Leg. The wound measures 0.9cm length x 0.8cm width x 0.1cm depth; 0.565cm^2 area and 0.057cm^3 volume. There is Fat Layer (Subcutaneous Tissue) exposed. There is no tunneling or undermining noted. There is [name] medium amount of serosanguineous drainage noted. There is medium (34-66%) pink granulation within the wound bed. There is [name] medium (34-66%) amount of necrotic tissue within the wound bed including Adherent Slough. 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 There was [name] Excisional Skin/Subcutaneous Tissue Debridement with [name] total area of 37.8 sq cm performed by Fredirick Maudlin, MD. With the following instrument(s): Curette to remove Non-Viable tissue/material. Material removed includes Subcutaneous Tissue, Slough, Fibrin/Exudate, and Hyper-granulation. No specimens were taken. [name] time out was conducted at 08:54, prior to the start of the procedure. [name] Minimum amount of bleeding was controlled with Pressure. The procedure was tolerated well with [name] pain level of 0 throughout and [name] pain level of 0 following the procedure. Post Debridement Measurements: 7cm length x 5.4cm width x 0.2cm depth; 5.938cm^3 volume. Post debridement Stage noted as Category/Stage II. Character of Wound/Ulcer Post Debridement is improved. Post procedure Diagnosis Wound #41R: Same as Pre-Procedure Wound #52 Pre-procedure diagnosis of Wound #52 is [name] Trauma, Other located on the Right,Dorsal Foot . There was [name] Excisional Skin/Subcutaneous Tissue Debridement with [name] total area of 0.9 sq cm performed by Fredirick Maudlin, MD. With the following instrument(s): Curette to remove Non-Viable tissue/material. Material removed includes Subcutaneous Tissue and Slough and. No specimens were taken. [name] time out was conducted at 08:54, prior to the start of the procedure. [name] Minimum amount of bleeding was controlled with Pressure. The procedure was tolerated well with [name] pain level of 0 throughout and [name] pain level of 0 following the procedure. Post Debridement Measurements: 0.5cm length x 1.8cm width x 0.1cm depth; 0.071cm^3 volume. Character of Wound/Ulcer Post Debridement is improved. 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 There was [name] Excisional Skin/Subcutaneous Tissue Debridement with [name] total  area of 1.68 sq cm performed by Fredirick Maudlin, MD. With the following instrument(s): Curette to remove Non-Viable tissue/material. Material removed includes Subcutaneous Tissue and Slough and. No specimens were taken. [name] time out was conducted at 08:54, prior to the start of the procedure. [name] Minimum amount of bleeding was controlled with Pressure. The procedure was tolerated well with [name] pain level of 0 throughout and [name] pain level of 0 following the procedure. Post Debridement Measurements: 2.4cm length x 0.7cm width x 0.2cm depth; 0.264cm^3 volume. Character of Wound/Ulcer Post Debridement is improved. Post procedure Diagnosis Wound #56: Same as Pre-Procedure Wound #57 Pre-procedure diagnosis of Wound #57 is [name] Neuropathic Ulcer-Non Diabetic located on the [place] . There was [name] Excisional Skin/Subcutaneous Tissue Debridement with [name] total area of 0.12 sq cm performed by Fredirick Maudlin, MD. With the following instrument(s): Curette to remove Non-Viable tissue/material. Material removed includes Callus, Subcutaneous Tissue, and Slough. No specimens were taken. [name] time out was conducted at 08:54, prior to the start of the procedure. [name] Minimum amount of bleeding was controlled with Pressure. The procedure was tolerated well with [name] pain level of 0 throughout and [name] pain level of 0 following the procedure. Post Debridement Measurements: 0.3cm length x 0.4cm width x 0.1cm depth; 0.009cm^3 volume. Character of Wound/Ulcer Post Debridement is improved. Post procedure Diagnosis Wound #57: Same as Pre-Procedure Wound #61 Pre-procedure diagnosis of Wound #61 is [name] Neuropathic Ulcer-Non Diabetic located on the Right,Medial Lower Leg .  There was [name] Excisional Skin/Subcutaneous Tissue Debridement with [name] total area of 0.72 sq cm performed by Fredirick Maudlin, MD. With the following instrument(s): Curette to remove Non-Viable tissue/material. Material removed includes Subcutaneous Tissue and Slough and. No  specimens were taken. [name] time out was conducted at 08:54, prior to the start of the procedure. [name] Minimum amount of bleeding was controlled with Pressure. The procedure was tolerated well with [name] pain level of 0 throughout and [name] pain level of 0 following the procedure. Post Debridement Measurements: 0.9cm length x 0.8cm width x 0.1cm depth; 0.057cm^3 volume. Character of Wound/Ulcer Post Debridement is improved. Post procedure Diagnosis Wound #61: Same as Pre-Procedure Plan Follow-up Appointments: Return Appointment in 2 weeks. - Dr Celine Ahr Bathing/ Shower/ Hygiene: May shower and wash wound with soap and water. - on days that dressing is changed Edema Control - Lymphedema / SCD / Other: Elevate legs to the level of the heart or above for 30 minutes daily and/or when sitting, [name] frequency of: - throughout the day Moisturize legs daily. - with dressing changes Compression stocking or Garment 30-40 mm/Hg pressure to: - Juxtalite to both legs daily Off-Loading: Roho cushion for wheelchair Turn and reposition every 2 hours WOUND #41R: - Ischium Wound Laterality: Left Cleanser: Soap and Water Every Other Day/30 Days Discharge Instructions: May shower and wash wound with dial antibacterial soap and water prior to dressing change. Peri-Wound Care: Zinc Oxide Ointment 30g tube Every Other Day/30 Days Discharge Instructions: Apply Zinc Oxide to periwound with each dressing change Prim Dressing: KerraCel Ag Gelling Fiber Dressing, 4x5 in (silver alginate) Every Other Day/30 Days ary Discharge Instructions: Apply silver alginate to wound bed as instructed Secondary Dressing: Woven Gauze Sponge, Non-Sterile 4x4 in Every Other Day/30 Days Discharge Instructions: Apply over primary dressing as directed. Secondary Dressing: ABD Pad, 8x10 Every Other Day/30 Days Discharge Instructions: Apply over primary dressing as directed. Secondary Dressing: Drawtex 4x4 in Every Other Day/30 Days Discharge Instructions:  Apply over primary dressing as directed. Secured With: 575M Medipore H Soft Cloth Surgical T 4 x 2 (in/yd) (Generic) Every Other Day/30 Days ape Discharge Instructions: Secure dressing with tape as directed. WOUND #52: - Foot Wound Laterality: Dorsal, Right Cleanser: Soap and Water Every Other Day/30 Days Discharge Instructions: May shower and wash wound with dial antibacterial soap and water prior to dressing change. Peri-Wound Care: Triamcinolone 15 (g) Every Other Day/30 Days Discharge Instructions: Use triamcinolone 15 (g) as directed Prim Dressing: IODOFLEX 0.9% Cadexomer Iodine Pad 4x6 cm Every Other Day/30 Days ary Discharge Instructions: Apply to wound bed as instructed Secondary Dressing: Woven Gauze Sponge, Non-Sterile 4x4 in Every Other Day/30 Days Discharge Instructions: Apply over primary dressing as directed. Secured With: [employer], 4.5x3.1 (in/yd) Every Other Day/30 Days Discharge Instructions: Secure with Kerlix as directed. Secured With: 575M Medipore H Soft Cloth Surgical T ape, 4 x 10 (in/yd) (Generic) Every Other Day/30 Days Discharge Instructions: Secure with tape as directed. WOUND #56: - Foot Wound Laterality: Dorsal, Left Cleanser: Soap and Water Every Other Day/30 Days Discharge Instructions: May shower and wash wound with dial antibacterial soap and water prior to dressing change. Peri-Wound Care: Triamcinolone 15 (g) Every Other Day/30 Days Discharge Instructions: Use triamcinolone 15 (g) as directed Prim Dressing: IODOFLEX 0.9% Cadexomer Iodine Pad 4x6 cm Every Other Day/30 Days ary Discharge Instructions: Apply to wound bed as instructed Secondary Dressing: Woven Gauze Sponge, Non-Sterile 4x4 in Every Other Day/30 Days Discharge Instructions: Apply over primary dressing as directed. Secured With:  [employer] Roll Sterile, 4.5x3.1 (in/yd) Every Other Day/30 Days Discharge Instructions: Secure with Kerlix as directed. Secured With: 647M Medipore H Soft Cloth  Surgical T ape, 4 x 10 (in/yd) (Generic) Every Other Day/30 Days Discharge Instructions: Secure with tape as directed. WOUND #57: - Foot Wound Laterality: Plantar, Right Cleanser: Soap and Water Every Other Day/30 Days Discharge Instructions: May shower and wash wound with dial antibacterial soap and water prior to dressing change. Peri-Wound Care: Ketoconazole Cream 2% Every Other Day/30 Days Discharge Instructions: Apply Ketoconazole as directed Peri-Wound Care: Triamcinolone 15 (g) Every Other Day/30 Days Discharge Instructions: Use triamcinolone 15 (g) as directed Prim Dressing: IODOFLEX 0.9% Cadexomer Iodine Pad 4x6 cm Every Other Day/30 Days ary Discharge Instructions: Apply to wound bed as instructed Secondary Dressing: Woven Gauze Sponge, Non-Sterile 4x4 in Every Other Day/30 Days Discharge Instructions: Apply over primary dressing as directed. Secured With: [employer], 4.5x3.1 (in/yd) Every Other Day/30 Days Discharge Instructions: Secure with Kerlix as directed. Secured With: 647M Medipore H Soft Cloth Surgical T ape, 4 x 10 (in/yd) (Generic) Every Other Day/30 Days Discharge Instructions: Secure with tape as directed. WOUND #61: - Lower Leg Wound Laterality: Right, Medial Cleanser: Soap and Water Every Other Day/30 Days Discharge Instructions: May shower and wash wound with dial antibacterial soap and water prior to dressing change. Peri-Wound Care: Ketoconazole Cream 2% Every Other Day/30 Days Discharge Instructions: Apply Ketoconazole as directed Peri-Wound Care: Triamcinolone 15 (g) Every Other Day/30 Days Discharge Instructions: Use triamcinolone 15 (g) as directed Prim Dressing: IODOFLEX 0.9% Cadexomer Iodine Pad 4x6 cm Every Other Day/30 Days ary Discharge Instructions: Apply to wound bed as instructed Secondary Dressing: Woven Gauze Sponge, Non-Sterile 4x4 in Every Other Day/30 Days Discharge Instructions: Apply over primary dressing as directed. Secured With:  [employer], 4.5x3.1 (in/yd) Every Other Day/30 Days Discharge Instructions: Secure with Kerlix as directed. Secured With: 647M Medipore H Soft Cloth Surgical T ape, 4 x 10 (in/yd) (Generic) Every Other Day/30 Days Discharge Instructions: Secure with tape as directed. 01/26/2022: This is [name] patient new to me, but has been followed by Dr. Dellia Nims in clinic for [name] long time. He has been using ketoconazole to the skin around his gluteal area, but I really do not see that this has been making [name] substantial difference. He has [name] lot of drainage in the skin is macerated. We will continue silver alginate, but use drawtex to try to better absorb the exudate and apply zinc oxide to the skin to try and protect it from the moisture. We will continue Iodoflex to his feet and Lotrisone lotion to try and lubricate his skin better. We will also apply triamcinolone to the periwound areas of his feet to decrease the inflammation. I will see him in 2 weeks. Electronic Signature(s) Signed: 01/26/2022 11:34:41 AM By: Fredirick Maudlin MD FACS Entered By: Fredirick Maudlin on 01/26/2022 11:34:40 -------------------------------------------------------------------------------- HxROS Details Patient Name: Date of Service: Bizzarro, [name] 01/26/2022 8:15 [name] Medical Record Number: [id number] Patient Account Number: [id number] Date of Birth/Sex: Treating RN: [birth year] (36 y.o. M) Primary Care Provider: [place], [place] Other Clinician: Referring Provider: Treating Provider/Extender: Cornelia Copa [name]: 316 Information Obtained From Patient Ear/Nose/Mouth/Throat Medical History: Negative for: Chronic sinus problems/congestion; Middle ear problems Hematologic/Lymphatic Medical History: Negative for: Anemia; Hemophilia; Human Immunodeficiency Virus; Lymphedema; Sickle Cell Disease Respiratory Medical History: Positive for: Sleep Apnea - does not tolerate cpap Negative for: Aspiration;  Asthma; Chronic Obstructive Pulmonary Disease (COPD); Pneumothorax; Tuberculosis Cardiovascular Medical  History: Positive for: Hypertension - lisinopril HCTZ Negative for: Angina; Arrhythmia; Congestive Heart Failure; Coronary Artery Disease; Deep Vein Thrombosis; Hypotension; Myocardial Infarction; Peripheral Arterial Disease; Peripheral Venous Disease; Phlebitis; Vasculitis Gastrointestinal Medical History: Negative for: Cirrhosis ; Colitis; Crohns; Hepatitis [name]; Hepatitis B; Hepatitis C Endocrine Medical History: Negative for: Type I Diabetes; Type II Diabetes Genitourinary Medical History: Negative for: End Stage Renal Disease Immunological Medical History: Negative for: Lupus Erythematosus; Raynauds; Scleroderma Integumentary (Skin) Medical History: Negative for: History of Burn Musculoskeletal Medical History: Negative for: Gout; Rheumatoid Arthritis; Osteoarthritis; Osteomyelitis Neurologic Medical History: Positive for: Paraplegia Negative for: Dementia; Neuropathy; Quadriplegia; Seizure Disorder Oncologic Medical History: Negative for: Received Chemotherapy; Received Radiation Psychiatric Medical History: Negative for: Anorexia/bulimia; Confinement Anxiety Immunizations Pneumococcal Vaccine: Received Pneumococcal Vaccination: No Immunization Notes: doesn't remember when last tetanus shot was Implantable Devices No devices added Hospitalization / Surgery History Type of Hospitalization/Surgery cellulitis in leg left leg vein ablation Family and Social History Cancer: No; Diabetes: Yes - Father; Heart Disease: No; Hereditary Spherocytosis: No; Hypertension: Yes - Mother; Kidney Disease: No; Lung Disease: No; Stroke: No; Thyroid Problems: No; Tuberculosis: No; Former smoker; Marital Status - Married; Alcohol Use: Moderate; Drug Use: No History; Caffeine Use: Daily; Financial Concerns: No; Food, Clothing or Shelter Needs: No; Support System Lacking: No;  Transportation Concerns: No Physician Affirmation I have reviewed and agree with the above information. Electronic Signature(s) Signed: 01/26/2022 11:52:54 AM By: Fredirick Maudlin MD FACS Entered By: Fredirick Maudlin on 01/26/2022 11:28:14 -------------------------------------------------------------------------------- SuperBill Details Patient Name: Date of Service: Dore, [name] 01/26/2022 Medical Record Number: [id number] Patient Account Number: [id number] Date of Birth/Sex: Treating RN: 11-Mar-1986 (35 y.o. 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 L97.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, stage 3 L97.821 Non-pressure chronic ulcer of other part of left lower leg limited to breakdown of skin L97.518 Non-pressure chronic ulcer of other part of right foot with other 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, 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 Non-pressure chronic ulcer of other part of left foot limited to breakdown of skin L03.115 Cellulitis of right lower limb Facility Procedures CPT4 Code: [id number] Description: 11042 - DEB SUBQ TISSUE 20 SQ CM/< ICD-10 Diagnosis Description L89.323 Pressure ulcer of left buttock, stage 3 Modifier: Quantity: 1 CPT4 Code: [id number] L Description: 11045 - DEB SUBQ TISS EA ADDL 20CM ICD-10 Diagnosis Description L97.821 Non-pressure chronic ulcer of other part of left lower leg limited to breakdown L97.518 Non-pressure chronic ulcer of other part of right foot with other specified sev  L97.521 Non-pressure chronic ulcer of other part of left foot limited to breakdown of s 89.323 Pressure ulcer of left buttock, stage  3 Modifier: of skin erity kin Quantity: 2 Physician Procedures : CPT4 Code Description Modifier [id number] 56433 - WC PHYS LEVEL 3 - EST PT 25 ICD-10 Diagnosis Description G82.21 Paraplegia, complete L89.323 Pressure ulcer of left buttock, stage 3 Quantity: 1 : [id number] 11042 - WC PHYS SUBQ TISS 20 SQ CM ICD-10 Diagnosis Description L89.323 Pressure ulcer of left buttock, stage 3 Quantity: 1 : [id number] 11045 - WC PHYS SUBQ TISS EA ADDL 20 CM ICD-10 Diagnosis Description L97.821 Non-pressure chronic ulcer of other part of left lower leg limited to breakdown of skin L97.518 Non-pressure chronic ulcer of other part of  right foot with other  specified severity L97.521 Non-pressure chronic ulcer of other part of left foot limited to breakdown of 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, stage 3 Quantity: 2 Electronic Signature(s) Signed: 01/26/2022 11:37:01 AM By: Fredirick Maudlin MD FACS Entered By: Fredirick Maudlin on 01/26/2022 11:37:00

## 2022-01-26 NOTE — Progress Notes (Signed)
[name], [name] DINGLEY ([id number]) Visit Report for 01/26/2022 Arrival Information Details Patient Name: Date of Service: [name], [name] Side 01/26/2022 8:15 [name] Medical Record Number: [id number] Patient Account Number: [id number] Date of Birth/Sex: Treating RN: 1986/02/16 (36 y.o. Collene Gobble Primary Care Hoorain Kozakiewicz: [place], [place] Other Clinician: Referring Rosevelt Luu: Treating Mailee Klaas/Extender: Cornelia Copa [name]: 316 Visit Information History Since Last Visit Added or deleted any medications: No Patient Arrived: Wheel Chair Any new allergies or adverse reactions: No Arrival Time: 08:06 Had [name] fall or experienced change in No Accompanied By: self activities of daily living that may affect Transfer Assistance: None risk of falls: Patient Requires Transmission-Based Precautions: No Signs or symptoms of abuse/neglect since last visito No Patient Has Alerts: Yes Hospitalized since last visit: No Patient Alerts: R ABI = 1.0 Implantable device outside of the clinic excluding No L ABI = 1.1 cellular tissue based products placed in the center since last visit: Has Dressing in Place as Prescribed: Yes Pain Present Now: No Electronic Signature(s) Signed: 01/26/2022 7:03:28 PM By: Dellie Catholic RN Entered By: Dellie Catholic on 01/26/2022 08:09:34 -------------------------------------------------------------------------------- Encounter Discharge Information Details Patient Name: Date of Service: [name], [name] 01/26/2022 8:15 [name] Medical Record Number: [id number] Patient Account Number: [id number] Date of Birth/Sex: Treating RN: 01/24/1986 (36 y.o. Collene Gobble Primary Care Nicoya Friel: [place], [place] Other Clinician: Referring Saralyn Willison: Treating Westlee Devita/Extender: Cornelia Copa [name]: 316 Encounter Discharge Information Items Post Procedure Vitals Discharge Condition: Stable Temperature (F): 98.4 Ambulatory Status:  Wheelchair Pulse (bpm): 117 Discharge Destination: Home Respiratory Rate (breaths/min): 18 Transportation: Private Auto Blood Pressure (mmHg): 126/66 Accompanied By: self Schedule Follow-up Appointment: Yes Clinical Summary of Care: Patient Declined Electronic Signature(s) Signed: 01/26/2022 7:03:28 PM By: Dellie Catholic RN Entered By: Dellie Catholic on 01/26/2022 19:03:05 -------------------------------------------------------------------------------- Lower Extremity Assessment Details Patient Name: Date of Service: [name], [name] 01/26/2022 8:15 [name] Medical Record Number: [id number] Patient Account Number: [id number] Date of Birth/Sex: Treating RN: Oct 20, 1986 (36 y.o. Collene Gobble Primary Care Aina Rossbach: [place], [place] Other Clinician: Referring Nicoles Sedlacek: Treating Kirandeep Fariss/Extender: Graciela Husbands, [name]: 316 Edema Assessment Assessed: [Left: No] [Right: No] Edema: [Left: Yes] [Right: Yes] Calf Left: Right: Point of Measurement: 33 cm From Medial Instep 28 cm 34.2 cm Ankle Left: Right: Point of Measurement: 10 cm From Medial Instep 24.1 cm 24.2 cm Electronic Signature(s) Signed: 01/26/2022 7:03:28 PM By: Dellie Catholic RN Entered By: Dellie Catholic on 01/26/2022 08:20:20 -------------------------------------------------------------------------------- Multi Wound Chart Details Patient Name: Date of Service: [name], [name] 01/26/2022 8:15 [name] Medical Record Number: [id number] Patient Account Number: [id number] Date of Birth/Sex: Treating RN: [birth year] (36 y.o. M) Primary Care Harshaan Whang: [place], [place] Other Clinician: Referring Gery Sabedra: Treating Demoni Gergen/Extender: Graciela Husbands, [name]: 316 Vital Signs Height(in): 70 Pulse(bpm): 117 Weight(lbs): 216 Blood Pressure(mmHg): 126/66 Body Mass Index(BMI): 31 Temperature(F): 98.4 Respiratory Rate(breaths/min): 18 Photos: [56:No Photos] Left Ischium Right,  Dorsal Foot Left, Dorsal Foot Wound Location: Gradually Appeared Gradually Appeared Gradually Appeared Wounding Event: Pressure Ulcer Trauma, Other Neuropathic Ulcer-Non Diabetic Primary Etiology: Sleep Apnea, Hypertension, Paraplegia Sleep Apnea, Hypertension, Paraplegia Sleep Apnea, Hypertension, Paraplegia Comorbid History: 03/16/2020 03/27/2021 07/11/2021 Date Acquired: 97 43 27 Weeks of Treatment: Open Open Open Wound Status: Yes No No Wound Recurrence: Yes No Yes Clustered Wound: 3 [name] [name] Clustered Quantity: 7x5.4x0.2 0.5x1.8x0.1 2.4x0.7x0.2 Measurements L x W x D (cm) 29.688 0.707 1.319 [name] (cm) : rea 5.938 0.071 0.264 Volume (cm) : -  1543.90% 62.50% 78.80% % Reduction in Area: -3180.70% 62.20% 57.60% % Reduction in Volume: Category/Stage II Full Thickness Without Exposed Full Thickness Without Exposed Classification: Support Structures Support Structures Medium Medium Medium Exudate [name] mount: Serosanguineous Serosanguineous Serosanguineous Exudate Type: red, brown red, brown red, brown Exudate Color: Distinct, outline attached Distinct, outline attached Distinct, outline attached Wound Margin: Small (1-33%) Small (1-33%) Medium (34-66%) Granulation [name] mount: Red Pink, Pale Pink, Pale Granulation Quality: Large (67-100%) Large (67-100%) Medium (34-66%) Necrotic [name] mount: Fat Layer (Subcutaneous Tissue): Yes Fat Layer (Subcutaneous Tissue): Yes Fascia: No Exposed Structures: Fascia: No Fascia: No Fat Layer (Subcutaneous Tissue): No Tendon: No Tendon: No Tendon: No Muscle: No Muscle: No Muscle: No Joint: No Joint: No Joint: No Bone: No Bone: No Bone: No Medium (34-66%) Small (1-33%) Small (1-33%) Epithelialization: Debridement - Excisional Debridement - Excisional Debridement - Excisional Debridement: Pre-procedure Verification/Time Out 08:54 08:54 08:54 Taken: Subcutaneous, Slough Subcutaneous, Slough Subcutaneous, Slough Tissue  Debrided: Skin/Subcutaneous Tissue Skin/Subcutaneous Tissue Skin/Subcutaneous Tissue Level: 37.8 0.9 1.68 Debridement [name] (sq cm): rea Curette Curette Curette Instrument: Minimum Minimum Minimum Bleeding: Pressure Pressure Pressure Hemostasis [name] chieved: 0 0 0 Procedural Pain: 0 0 0 Post Procedural Pain: Procedure was tolerated well Procedure was tolerated well Procedure was tolerated well Debridement Treatment Response: 7x5.4x0.2 0.5x1.8x0.1 2.4x0.7x0.2 Post Debridement Measurements L x W x D (cm) 5.938 0.071 0.264 Post Debridement Volume: (cm) Category/Stage II [name] [name] Post Debridement Stage: Debridement Debridement Debridement Procedures Performed: Wound Number: [id number] [name] Photos: [name] Right, Plantar Foot Right, Medial Lower Leg [name] Wound Location: Gradually Appeared Gradually Appeared [name] Wounding Event: Neuropathic Ulcer-Non Diabetic T be determined o [name] Primary Etiology: Sleep Apnea, Hypertension, Paraplegia Sleep Apnea, Hypertension, Paraplegia [name] Comorbid History: 08/25/2021 01/10/2022 [name] Date Acquired: 22 2 [name] Weeks of Treatment: Open Open [name] Wound Status: No No [name] Wound Recurrence: No No [name] Clustered Wound: [name] [name] [name] Clustered Quantity: 0.3x0.4x0.1 0.9x0.8x0.1 [name] Measurements L x W x D (cm) 0.094 0.565 [name] [name] (cm) : rea 0.009 0.057 [name] Volume (cm) : [name] 10.00% [name] % Reduction in [name] rea: [name] 9.50% [name] % Reduction in Volume: Full Thickness Without Exposed Full Thickness Without Exposed [name] Classification: Support Structures Support Structures Medium Medium [name] Exudate Amount: Serosanguineous Serosanguineous [name] Exudate Type: red, brown red, brown [name] Exudate Color: [name] [name] [name] Wound Margin: Medium (34-66%) Medium (34-66%) [name] Granulation Amount: Pink Pink [name] Granulation Quality: Medium (34-66%) Medium (34-66%) [name] Necrotic Amount: Fat Layer (Subcutaneous Tissue): Yes Fat Layer (Subcutaneous Tissue): Yes [name] Exposed  Structures: Fascia: No Fascia: No Tendon: No Tendon: No Muscle: No Muscle: No Joint: No Joint: No Bone: No Bone: No Medium (34-66%) Small (1-33%) [name] Epithelialization: Debridement - Excisional Debridement - Excisional [name] Debridement: 08:54 08:54 [name] Pre-procedure Verification/Time Out Taken: Callus, Subcutaneous, Slough Subcutaneous, Slough [name] Tissue Debrided: Skin/Subcutaneous Tissue Skin/Subcutaneous Tissue [name] Level: 0.12 0.72 [name] Debridement [name] (sq cm): rea Curette Curette [name] Instrument: Minimum Minimum [name] Bleeding: Pressure Pressure [name] Hemostasis [name] chieved: 0 0 [name] Procedural Pain: 0 0 [name] Post Procedural Pain: Procedure was tolerated well Procedure was tolerated well [name] Debridement Treatment Response: 0.3x0.4x0.1 0.9x0.8x0.1 [name] Post Debridement Measurements L x W x D (cm) 0.009 0.057 [name] Post Debridement Volume: (cm) [name] [name] [name] Post Debridement Stage: Debridement Debridement [name] Procedures Performed: Treatment Notes Electronic Signature(s) Signed: 01/26/2022 9:21:46 AM By: Fredirick Maudlin MD FACS Entered By: Fredirick Maudlin on 01/26/2022 09:21:45 -------------------------------------------------------------------------------- Multi-Disciplinary Care Plan Details Patient Name: Date of Service: Backs, [name] 01/26/2022 8:15 [name]  M Medical Record Number: [id number] Patient Account Number: [id number] Date of Birth/Sex: Treating RN: 1986-04-04 (36 y.o. Collene Gobble Primary Care Aleeya Veitch: [name], [name] Other Clinician: Referring Braylyn [name]: Treating Dnyla Antonetti/Extender: Cornelia Copa [name]: [place] reviewed with physician Active Inactive Wound/Skin Impairment Nursing Diagnoses: Impaired tissue integrity Knowledge deficit related to ulceration/compromised skin integrity Goals: Patient/caregiver will verbalize understanding of skin care regimen Date Initiated: 01/05/2016 Target  Resolution Date: 02/24/2022 Goal Status: Active Ulcer/skin breakdown will have [name] volume reduction of 30% by week 4 Date Initiated: 01/05/2016 Date Inactivated: 12/22/2017 Target Resolution Date: 01/19/2018 Unmet Reason: complex wounds, Goal Status: Unmet infection Interventions: Assess patient/caregiver ability to obtain necessary supplies Assess ulceration(s) every visit Provide education on ulcer and skin care Notes: 02/02/21: Complex Care, ongoing. Electronic Signature(s) Signed: 01/26/2022 7:03:28 PM By: Dellie Catholic RN Entered By: Dellie Catholic on 01/26/2022 19:00:47 -------------------------------------------------------------------------------- Pain Assessment Details Patient Name: Date of Service: Mccleod, [name] 01/26/2022 8:15 [name] Medical Record Number: [id number] Patient Account Number: [id number] Date of Birth/Sex: Treating RN: [birth year] (36 y.o. Collene Gobble Primary Care Johnmichael Melhorn: [place], [place] Other Clinician: Referring Darlene Brozowski: Treating Riko Lumsden/Extender: Graciela Husbands, [name]: 316 Active Problems Location of Pain Severity and Description of Pain Patient Has Paino No Site Locations Pain Management and Medication Current Pain Management: Electronic Signature(s) Signed: 01/26/2022 7:03:28 PM By: Dellie Catholic RN Entered By: Dellie Catholic on 01/26/2022 08:10:21 -------------------------------------------------------------------------------- Patient/Caregiver Education Details Patient Name: Date of Service: Phillippi, [name] Viviann Spare 3/7/2023andnbsp8:15 [name] Medical Record Number: [id number] Patient Account Number: [id number] Date of Birth/Gender: Treating RN: 12/30/1985 (36 y.o. Collene Gobble Primary Care Physician: Janine Limbo Other Clinician: Referring Physician: Treating Physician/Extender: Cornelia Copa [name]: 316 Education Assessment Education Provided To: Patient Education Topics  Provided Wound/Skin Impairment: Methods: Explain/Verbal Responses: Return demonstration correctly Electronic Signature(s) Signed: 01/26/2022 7:03:28 PM By: Dellie Catholic RN Entered By: Dellie Catholic on 01/26/2022 19:01:22 -------------------------------------------------------------------------------- Wound Assessment Details Patient Name: Date of Service: Sek, [name] 01/26/2022 8:15 [name] Medical Record Number: [id number] Patient Account Number: [id number] Date of Birth/Sex: Treating RN: 05-17-1986 (36 y.o. Collene Gobble Primary Care Judie Hollick: [name], [name] Other Clinician: Referring Tyneshia Stivers: Treating Deandra Gadson/Extender: Graciela Husbands, [name]: 316 Wound Status Wound Number: [id number] Primary Etiology: Pressure Ulcer Wound Location: Left Ischium Wound Status: Open Wounding Event: Gradually Appeared Comorbid History: Sleep Apnea, Hypertension, Paraplegia Date Acquired: 03/16/2020 Weeks Of Treatment: 97 Clustered Wound: Yes Photos Wound Measurements Length: (cm) 7 Width: (cm) 5.4 Depth: (cm) 0.2 Clustered Quantity: 3 Area: (cm) 29.688 Volume: (cm) 5.938 % Reduction in Area: -1543.9% % Reduction in Volume: -3180.7% Epithelialization: Medium (34-66%) Tunneling: No Undermining: No Wound Description Classification: Category/Stage II Wound Margin: Distinct, outline attached Exudate Amount: Medium Exudate Type: Serosanguineous Exudate Color: red, brown Foul Odor After Cleansing: No Slough/Fibrino Yes Wound Bed Granulation Amount: Small (1-33%) Exposed Structure Granulation Quality: Red Fascia Exposed: No Necrotic Amount: Large (67-100%) Fat Layer (Subcutaneous Tissue) Exposed: Yes Necrotic Quality: Adherent Slough Tendon Exposed: No Muscle Exposed: No Joint Exposed: No Bone Exposed: No Treatment Notes Wound #[id number] (Ischium) Wound Laterality: Left Cleanser Soap and Water Discharge Instruction: May shower and wash wound with dial  antibacterial soap and water prior to dressing change. Peri-Wound Care Zinc Oxide Ointment 30g tube Discharge Instruction: Apply Zinc Oxide to periwound with each dressing change Topical Primary Dressing KerraCel Ag Gelling Fiber Dressing, 4x5 in (silver alginate) Discharge Instruction: Apply silver alginate to wound bed  as instructed Secondary Dressing Woven Gauze Sponge, Non-Sterile 4x4 in Discharge Instruction: Apply over primary dressing as directed. ABD Pad, 8x10 Discharge Instruction: Apply over primary dressing as directed. Drawtex 4x4 in Discharge Instruction: Apply over primary dressing as directed. Secured With 56M Medipore H Soft Cloth Surgical T 4 x 2 (in/yd) ape Discharge Instruction: Secure dressing with tape as directed. Compression Wrap Compression Stockings Add-Ons Electronic Signature(s) Signed: 01/26/2022 7:03:28 PM By: Dellie Catholic RN Entered By: Dellie Catholic on 01/26/2022 08:46:00 -------------------------------------------------------------------------------- Wound Assessment Details Patient Name: Date of Service: Dunaj, [name] 01/26/2022 8:15 [name] Medical Record Number: [id number] Patient Account Number: [id number] Date of Birth/Sex: Treating RN: [birth year] (36 y.o. Collene Gobble Primary Care Davanna He: [name], [name] Other Clinician: Referring Ruthmary Occhipinti: Treating Bookert Guzzi/Extender: Graciela Husbands, [name]: 316 Wound Status Wound Number: [id number] Primary Etiology: Trauma, Other Wound Location: Right, Dorsal Foot Wound Status: Open Wounding Event: Gradually Appeared Comorbid History: Sleep Apnea, Hypertension, Paraplegia Date Acquired: 03/27/2021 Weeks Of Treatment: 43 Clustered Wound: No Photos Wound Measurements Length: (cm) 0.5 Width: (cm) 1.8 Depth: (cm) 0.1 Area: (cm) 0.707 Volume: (cm) 0.071 % Reduction in Area: 62.5% % Reduction in Volume: 62.2% Epithelialization: Small (1-33%) Tunneling: No Undermining:  No Wound Description Classification: Full Thickness Without Exposed Support Structures Wound Margin: Distinct, outline attached Exudate Amount: Medium Exudate Type: Serosanguineous Exudate Color: red, brown Foul Odor After Cleansing: No Slough/Fibrino Yes Wound Bed Granulation Amount: Small (1-33%) Exposed Structure Granulation Quality: Pink, Pale Fascia Exposed: No Necrotic Amount: Large (67-100%) Fat Layer (Subcutaneous Tissue) Exposed: Yes Necrotic Quality: Adherent Slough Tendon Exposed: No Muscle Exposed: No Joint Exposed: No Bone Exposed: No Treatment Notes Wound #52 (Foot) Wound Laterality: Dorsal, Right Cleanser Soap and Water Discharge Instruction: May shower and wash wound with dial antibacterial soap and water prior to dressing change. Peri-Wound Care Triamcinolone 15 (g) Discharge Instruction: Use triamcinolone 15 (g) as directed Topical Primary Dressing IODOFLEX 0.9% Cadexomer Iodine Pad 4x6 cm Discharge Instruction: Apply to wound bed as instructed Secondary Dressing Woven Gauze Sponge, Non-Sterile 4x4 in Discharge Instruction: Apply over primary dressing as directed. Secured With [employer], 4.5x3.1 (in/yd) Discharge Instruction: Secure with Kerlix as directed. 56M Medipore H Soft Cloth Surgical T ape, 4 x 10 (in/yd) Discharge Instruction: Secure with tape as directed. Compression Wrap Compression Stockings Add-Ons Electronic Signature(s) Signed: 01/26/2022 7:03:28 PM By: Dellie Catholic RN Entered By: Dellie Catholic on 01/26/2022 08:42:30 -------------------------------------------------------------------------------- Wound Assessment Details Patient Name: Date of Service: Gildersleeve, [name] 01/26/2022 8:15 [name] Medical Record Number: [id number] Patient Account Number: [id number] Date of Birth/Sex: Treating RN: 1986-01-17 (36 y.o. Collene Gobble Primary Care Lazer Wollard: [name], [name] Other Clinician: Referring Anjel Pardo: Treating  Tyrin Herbers/Extender: Graciela Husbands, [name]: 316 Wound Status Wound Number: 56 Primary Etiology: Neuropathic Ulcer-Non Diabetic Wound Location: Left, Dorsal Foot Wound Status: Open Wounding Event: Gradually Appeared Comorbid History: Sleep Apnea, Hypertension, Paraplegia Date Acquired: 07/11/2021 Weeks Of Treatment: 27 Clustered Wound: Yes Wound Measurements Length: (cm) 2.4 Width: (cm) 0.7 Depth: (cm) 0.2 Area: (cm) 1.319 Volume: (cm) 0.264 % Reduction in Area: 78.8% % Reduction in Volume: 57.6% Epithelialization: Small (1-33%) Tunneling: No Undermining: No Wound Description Classification: Full Thickness Without Exposed Support Structures Wound Margin: Distinct, outline attached Exudate Amount: Medium Exudate Type: Serosanguineous Exudate Color: red, brown Foul Odor After Cleansing: No Slough/Fibrino Yes Wound Bed Granulation Amount: Medium (34-66%) Exposed Structure Granulation Quality: Pink, Pale Fascia Exposed: No Necrotic Amount: Medium (34-66%) Fat Layer (Subcutaneous Tissue) Exposed: No  Necrotic Quality: Adherent Slough Tendon Exposed: No Muscle Exposed: No Joint Exposed: No Bone Exposed: No Treatment Notes Wound #56 (Foot) Wound Laterality: Dorsal, Left Cleanser Soap and Water Discharge Instruction: May shower and wash wound with dial antibacterial soap and water prior to dressing change. Peri-Wound Care Triamcinolone 15 (g) Discharge Instruction: Use triamcinolone 15 (g) as directed Topical Primary Dressing IODOFLEX 0.9% Cadexomer Iodine Pad 4x6 cm Discharge Instruction: Apply to wound bed as instructed Secondary Dressing Woven Gauze Sponge, Non-Sterile 4x4 in Discharge Instruction: Apply over primary dressing as directed. Secured With [employer], 4.5x3.1 (in/yd) Discharge Instruction: Secure with Kerlix as directed. 41M Medipore H Soft Cloth Surgical T ape, 4 x 10 (in/yd) Discharge Instruction: Secure with tape  as directed. Compression Wrap Compression Stockings Add-Ons Electronic Signature(s) Signed: 01/26/2022 7:03:28 PM By: Dellie Catholic RN Entered By: Dellie Catholic on 01/26/2022 08:36:57 -------------------------------------------------------------------------------- Wound Assessment Details Patient Name: Date of Service: [place], [name] 01/26/2022 8:15 [name] Medical Record Number: [id number] Patient Account Number: [id number] Date of Birth/Sex: Treating RN: 09/08/1986 (36 y.o. Collene Gobble Primary Care Lillia Lengel: [name], [name] Other Clinician: Referring Tameah Mihalko: Treating Beadie Matsunaga/Extender: Graciela Husbands, [name]: 316 Wound Status Wound Number: 57 Primary Etiology: Neuropathic Ulcer-Non Diabetic Wound Location: Right, Plantar Foot Wound Status: Open Wounding Event: Gradually Appeared Comorbid History: Sleep Apnea, Hypertension, Paraplegia Date Acquired: 08/25/2021 Weeks Of Treatment: 22 Clustered Wound: No Photos Wound Measurements Length: (cm) 0.3 Width: (cm) 0.4 Depth: (cm) 0.1 Area: (cm) 0.094 Volume: (cm) 0.009 % Reduction in Area: % Reduction in Volume: Epithelialization: Medium (34-66%) Tunneling: No Undermining: No Wound Description Classification: Full Thickness Without Exposed Support Structures Exudate Amount: Medium Exudate Type: Serosanguineous Exudate Color: red, brown Foul Odor After Cleansing: No Slough/Fibrino Yes Wound Bed Granulation Amount: Medium (34-66%) Exposed Structure Granulation Quality: Pink Fascia Exposed: No Necrotic Amount: Medium (34-66%) Fat Layer (Subcutaneous Tissue) Exposed: Yes Necrotic Quality: Adherent Slough Tendon Exposed: No Muscle Exposed: No Joint Exposed: No Bone Exposed: No Treatment Notes Wound #57 (Foot) Wound Laterality: Plantar, Right Cleanser Soap and Water Discharge Instruction: May shower and wash wound with dial antibacterial soap and water prior to dressing  change. Peri-Wound Care Ketoconazole Cream 2% Discharge Instruction: Apply Ketoconazole as directed Triamcinolone 15 (g) Discharge Instruction: Use triamcinolone 15 (g) as directed Topical Primary Dressing IODOFLEX 0.9% Cadexomer Iodine Pad 4x6 cm Discharge Instruction: Apply to wound bed as instructed Secondary Dressing Woven Gauze Sponge, Non-Sterile 4x4 in Discharge Instruction: Apply over primary dressing as directed. Secured With [employer], 4.5x3.1 (in/yd) Discharge Instruction: Secure with Kerlix as directed. 41M Medipore H Soft Cloth Surgical T ape, 4 x 10 (in/yd) Discharge Instruction: Secure with tape as directed. Compression Wrap Compression Stockings Add-Ons Electronic Signature(s) Signed: 01/26/2022 7:03:28 PM By: Dellie Catholic RN Entered By: Dellie Catholic on 01/26/2022 08:31:13 -------------------------------------------------------------------------------- Wound Assessment Details Patient Name: Date of Service: Bergdoll, [name] 01/26/2022 8:15 [name] Medical Record Number: [id number] Patient Account Number: [id number] Date of Birth/Sex: Treating RN: 27-Nov-1985 (36 y.o. Collene Gobble Primary Care Merrily Tegeler: [place], [place] Other Clinician: Referring Devinn Voshell: Treating Cayle Cordoba/Extender: Graciela Husbands, [name]: 316 Wound Status Wound Number: 61 Primary Etiology: T be determined o Wound Location: Right, Medial Lower Leg Wound Status: Open Wounding Event: Gradually Appeared Comorbid History: Sleep Apnea, Hypertension, Paraplegia Date Acquired: 01/10/2022 Weeks Of Treatment: 2 Clustered Wound: No Photos Wound Measurements Length: (cm) 0.9 Width: (cm) 0.8 Depth: (cm) 0.1 Area: (cm) 0.565 Volume: (cm) 0.057 % Reduction in Area:  10% % Reduction in Volume: 9.5% Epithelialization: Small (1-33%) Tunneling: No Undermining: No Wound Description Classification: Full Thickness Without Exposed Support Struct Exudate  Amount: Medium Exudate Type: Serosanguineous Exudate Color: red, brown ures Foul Odor After Cleansing: No Slough/Fibrino Yes Wound Bed Granulation Amount: Medium (34-66%) Exposed Structure Granulation Quality: Pink Fascia Exposed: No Necrotic Amount: Medium (34-66%) Fat Layer (Subcutaneous Tissue) Exposed: Yes Necrotic Quality: Adherent Slough Tendon Exposed: No Muscle Exposed: No Joint Exposed: No Bone Exposed: No Electronic Signature(s) Signed: 01/26/2022 7:03:28 PM By: Dellie Catholic RN Entered By: Dellie Catholic on 01/26/2022 08:32:20 -------------------------------------------------------------------------------- Vitals Details Patient Name: Date of Service: Ferrin, [name] 01/26/2022 8:15 [name] Medical Record Number: [id number] Patient Account Number: [id number] Date of Birth/Sex: Treating RN: 17-May-1986 (36 y.o. Collene Gobble Primary Care [place] Speciale: [place], [place] Other Clinician: Referring Lavonne Kinderman: Treating Kaydenn Mclear/Extender: Graciela Husbands, [name]: 316 Vital Signs Time Taken: 08:07 Temperature (F): 98.4 Height (in): 70 Pulse (bpm): 117 Weight (lbs): 216 Respiratory Rate (breaths/min): 18 Body Mass Index (BMI): 31 Blood Pressure (mmHg): 126/66 Reference Range: 80 - 120 mg / dl Electronic Signature(s) Signed: 01/26/2022 7:03:28 PM By: Dellie Catholic RN Entered By: Dellie Catholic on 01/26/2022 08:10:09

## 2022-02-09 ENCOUNTER — Encounter (HOSPITAL_BASED_OUTPATIENT_CLINIC_OR_DEPARTMENT_OTHER): Payer: BC Managed Care – PPO | Admitting: General Surgery

## 2022-02-09 ENCOUNTER — Other Ambulatory Visit: Payer: Self-pay

## 2022-02-09 DIAGNOSIS — L97821 Non-pressure chronic ulcer of other part of left lower leg limited to breakdown of skin: Secondary | ICD-10-CM | POA: Diagnosis not present

## 2022-02-09 DIAGNOSIS — L03116 Cellulitis of left lower limb: Secondary | ICD-10-CM | POA: Diagnosis not present

## 2022-02-09 DIAGNOSIS — L97518 Non-pressure chronic ulcer of other part of right foot with other specified severity: Secondary | ICD-10-CM | POA: Diagnosis not present

## 2022-02-09 DIAGNOSIS — L84 Corns and callosities: Secondary | ICD-10-CM | POA: Diagnosis not present

## 2022-02-09 DIAGNOSIS — G8221 Paraplegia, complete: Secondary | ICD-10-CM | POA: Diagnosis not present

## 2022-02-09 DIAGNOSIS — L89323 Pressure ulcer of left buttock, stage 3: Secondary | ICD-10-CM | POA: Diagnosis not present

## 2022-02-09 DIAGNOSIS — I87332 Chronic venous hypertension (idiopathic) with ulcer and inflammation of left lower extremity: Secondary | ICD-10-CM | POA: Diagnosis not present

## 2022-02-09 DIAGNOSIS — L97521 Non-pressure chronic ulcer of other part of left foot limited to breakdown of skin: Secondary | ICD-10-CM | POA: Diagnosis not present

## 2022-02-09 DIAGNOSIS — L97511 Non-pressure chronic ulcer of other part of right foot limited to breakdown of skin: Secondary | ICD-10-CM | POA: Diagnosis not present
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
## 2022-02-10 NOTE — Progress Notes (Signed)
Babino, Zarion E. ([id number]) ?Visit Report for 02/09/2022 ?Arrival Information Details ?Patient Name: Date of Service: ?[name], [name] 02/09/2022 8:15 [name] ?Medical Record Number: [id number] ?Patient Account Number: [id number] ?Date of Birth/Sex: Treating RN: ?04-28-1986 (36 y.o. [name] ?Primary Care Shauntelle Jamerson: [name], [place] Other Clinician: ?Referring Edmar Blankenburg: ?Treating Orlyn Odonoghue/Extender: Fredirick Maudlin ?[place], [place] ?[name]: 318 ?Visit Information History Since Last Visit ?Added or deleted any medications: No ?Patient Arrived: Wheel Chair ?Any new allergies or adverse reactions: No ?Arrival Time: 08:05 ?Had [name] fall or experienced change in No ?Accompanied By: self ?activities of daily living that may affect ?Transfer Assistance: Manual ?risk of falls: ?Patient Identification Verified: Yes ?Signs or symptoms of abuse/neglect since last visito No ?Secondary Verification Process Completed: Yes ?Hospitalized since last visit: No ?Patient Requires Transmission-Based Precautions: No ?Implantable device outside of the clinic excluding No ?Patient Has Alerts: Yes ?cellular tissue based products placed in the center ?Patient Alerts: R ABI = 1.0 since last visit: ?L ABI = 1.1 Has Dressing in Place as Prescribed: Yes ?Pain Present Now: No ?Electronic Signature(s) ?Signed: 02/09/2022 3:06:47 PM By: Sandre Kitty ?Entered By: Sandre Kitty on 02/09/2022 08:05:34 ?-------------------------------------------------------------------------------- ?Encounter Discharge Information Details ?Patient Name: Date of Service: ?[name], [name] 02/09/2022 8:15 [name] ?Medical Record Number: [id number] ?Patient Account Number: [id number] ?Date of Birth/Sex: Treating RN: ?Jun 02, 1986 (36 y.o. [name] ?Primary Care Corianna Avallone: [place], [place] Other Clinician: ?Referring Kaelob Persky: ?Treating Kenneth Cuaresma/Extender: Fredirick Maudlin ?[place], [place] ?[name]: 318 ?Encounter Discharge Information Items Post Procedure  Vitals ?Discharge Condition: Stable ?Temperature (F): 98.4 ?Ambulatory Status: Wheelchair ?Pulse (bpm): 118 ?Discharge Destination: Home ?Respiratory Rate (breaths/min): 18 ?Transportation: Private Auto ?Blood Pressure (mmHg): 133/73 ?Accompanied By: self ?Schedule Follow-up Appointment: Yes ?Clinical Summary of Care: Patient Declined ?Electronic Signature(s) ?Signed: 02/10/2022 5:05:47 PM By: Baruch Gouty RN, BSN ?Entered By: Baruch Gouty on 02/09/2022 09:24:13 ?-------------------------------------------------------------------------------- ?Lower Extremity Assessment Details ?Patient Name: ?Date of Service: ?[name], [name] 02/09/2022 8:15 [name] ?Medical Record Number: [id number] ?Patient Account Number: [id number] ?Date of Birth/Sex: ?Treating RN: ?1986-04-09 (36 y.o. [name] ?Primary Care Adalay Azucena: [place], [place] ?Other Clinician: ?Referring Ellen Mayol: ?Treating Brigid Vandekamp/Extender: Fredirick Maudlin ?[place], [place] ?[name]: 318 ?Edema Assessment ?Assessed: [Left: No] [Right: No] ?Edema: [Left: Yes] [Right: Yes] ?Calf ?Left: Right: ?Point of Measurement: 33 cm From Medial Instep 28.3 cm 34.5 cm ?Ankle ?Left: Right: ?Point of Measurement: 10 cm From Medial Instep 24.6 cm 24.6 cm ?Vascular Assessment ?Pulses: ?Dorsalis Pedis ?Palpable: [Left:Yes] [Right:Yes] ?Electronic Signature(s) ?Signed: 02/10/2022 5:05:47 PM By: Baruch Gouty RN, BSN ?Entered By: Baruch Gouty on 02/09/2022 08:35:56 ?-------------------------------------------------------------------------------- ?Multi Wound Chart Details ?Patient Name: ?Date of Service: ?[name], [name] 02/09/2022 8:15 [name] ?Medical Record Number: [id number] ?Patient Account Number: [id number] ?Date of Birth/Sex: ?Treating RN: ?1986-07-28 (36 y.o. [name] ?Primary Care Katrisha Segall: [place], [place] ?Other Clinician: ?Referring Maleah Rabago: ?Treating Roquel Burgin/Extender: Fredirick Maudlin ?[place], [place] ?[name]: 318 ?Vital Signs ?Height(in):  70 ?Pulse(bpm): 118 ?Weight(lbs): 216 ?Blood Pressure(mmHg): 133/73 ?Body Mass Index(BMI): 31 ?Temperature(??F): 98.4 ?Respiratory Rate(breaths/min): 18 ?Photos: [41R:Left Ischium] [52:Right, Dorsal Foot] [56:Left, Dorsal Foot] ?Wound Location: [41R:Gradually Appeared] [52:Gradually Appeared] [56:Gradually Appeared] ?Wounding Event: [41R:Pressure Ulcer] [52:Trauma, Other] [56:Neuropathic Ulcer-Non Diabetic] ?Primary Etiology: [41R:Sleep Apnea, Hypertension, Paraplegia Sleep Apnea, Hypertension, Paraplegia Sleep Apnea, Hypertension, Paraplegia] ?Comorbid History: [41R:03/16/2020] [52:03/27/2021] [56:07/11/2021] ?Date Acquired: [41R:99] [52:45] [56:29] ?Weeks of Treatment: [41R:Open] [52:Open] [56:Open] ?Wound Status: [41R:Yes] [52:No] [56:No] ?Wound Recurrence: [41R:Yes] [52:No] [56:Yes] ?Clustered Wound: [41R:3] [52:[name]] [56:[name]] ?Clustered Quantity: [41R:3x2.1x0.1] [52:1x4x0.1] [56:1x0.7x0.2] ?Measurements L  x W x D (cm) [41R:4.948] [52:3.142] [56:0.55] ?[name] (cm?) : ?rea [41R:0.495] [52:0.314] [56:0.11] ?Volume (cm?) : [41R:-174.00%] [52:-66.70%] [56:91.20%] ?% Reduction in [name] rea: [41R:-173.50%] [52:-67.00%] [56:82.30%] ?% Reduction in Volume: [41R:Category/Stage II] [52:Full Thickness Without Exposed] [56:Full Thickness Without Exposed] ?Classification: [41R:Medium] [52:Support Structures Medium] [56:Support Structures Medium] ?Exudate [name] mount: [41R:Serosanguineous] [52:Serosanguineous] [56:Serosanguineous] ?Exudate Type: [41R:red, brown] [52:red, brown] [56:red, brown] ?Exudate Color: [41R:Distinct, outline attached] [52:Distinct, outline attached] [56:Distinct, outline attached] ?Wound Margin: [41R:Large (67-100%)] [52:Small (1-33%)] [56:Medium (34-66%)] ?Granulation [name] mount: [41R:Red, Friable] [52:Pink, Pale] [56:Pink, Pale] ?Granulation Quality: [41R:None Present (0%)] [52:Large (67-100%)] [56:Medium (34-66%)] ?Necrotic [name] mount: ?[41R:Fat Layer (Subcutaneous Tissue): Yes Fat Layer (Subcutaneous Tissue): Yes Fat  Layer (Subcutaneous Tissue): Yes] ?Exposed Structures: ?[41R:Fascia: No Tendon: No Muscle: No Joint: No Bone: No Small (1-33%)] [52:Fascia: No Tendon: No Muscle: No Joint: No Bone: No Small (1-33%)] [56:Fascia: No Tendon: No Muscle: No Joint: No Bone: No Small (1-33%)] ?Epithelialization: [41R:[name]] [52:Debridement - Excisional] [56:Debridement - Excisional] ?Debridement: ?Pre-procedure Verification/Time Out [name] [52:08:50] [56:08:50] ?Taken: [41R:[name]] [52:Subcutaneous, Slough] [56:Callus, Subcutaneous, Slough] ?Tissue Debrided: [41R:[name]] [52:Skin/Subcutaneous Tissue] [56:Skin/Subcutaneous Tissue] ?Level: [41R:[name]] [52:4] [56:1.4] ?Debridement [name] (sq cm): [41R:rea [name]] [52:Curette] [56:Curette] ?Instrument: [41R:[name]] [52:Minimum] [56:Minimum] ?Bleeding: [41R:[name]] [52:Pressure] [56:Pressure] ?Hemostasis [name] chieved: [41R:[name]] [52:Insensate] [56:Insensate] ?Procedural Pain: [41R:[name]] [52:Insensate] [56:Insensate] ?Post Procedural Pain: [41R:[name]] [52:Procedure was tolerated well] [56:Procedure was tolerated well] ?Debridement Treatment Response: [41R:[name]] [52:1x4x0.1] [56:1x0.7x0.1] ?Post Debridement Measurements L x ?W x D (cm) [41R:[name]] [52:0.314] [56:0.055] ?Post Debridement Volume: (cm?) [41R:Chemical Cauterization] [52:Debridement] [56:Debridement] ?Wound Number: [id number] [name] ?Photos: [name] ?Right, Plantar Foot Right, Medial Lower Leg [name] ?Wound Location: ?Gradually Appeared Gradually Appeared [name] ?Wounding Event: ?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 [name] ?Primary Etiology: ?Sleep Apnea, Hypertension, Paraplegia Sleep Apnea, Hypertension, Paraplegia [name] ?Comorbid History: ?08/25/2021 01/10/2022 [name] ?Date Acquired: ?35 4 [name] ?Weeks of Treatment: ?Open Open [name] ?Wound Status: ?No No [name] ?Wound Recurrence: ?No No [name] ?Clustered Wound: ?[name] [name] [name] ?Clustered Quantity: ?0.5x0.5x0.1 0.8x0.8x0.1 [name] ?Measurements L x W x D (cm) ?0.196 0.503 [name] ?[name] (cm?) : ?rea ?0.02 0.05 [name] ?Volume (cm?) : ?80.80% 19.90%  [name] ?% Reduction in [name] rea: ?80.40% 20.60% [name] ?% Reduction in Volume: ?Full Thickness Without Exposed Full Thickness Without Exposed [name] ?Classification: ?Support Structures Support Structures ?Medium Medium [name] ?Ex

## 2022-02-23 ENCOUNTER — Encounter (HOSPITAL_BASED_OUTPATIENT_CLINIC_OR_DEPARTMENT_OTHER): Payer: BC Managed Care – PPO | Attending: General Surgery | Admitting: General Surgery

## 2022-02-23 DIAGNOSIS — L89323 Pressure ulcer of left buttock, stage 3: Secondary | ICD-10-CM | POA: Diagnosis not present

## 2022-02-23 DIAGNOSIS — L97518 Non-pressure chronic ulcer of other part of right foot with other specified severity: Secondary | ICD-10-CM | POA: Insufficient documentation

## 2022-02-23 DIAGNOSIS — L97511 Non-pressure chronic ulcer of other part of right foot limited to breakdown of skin: Secondary | ICD-10-CM | POA: Diagnosis not present

## 2022-02-23 DIAGNOSIS — I10 Essential (primary) hypertension: Secondary | ICD-10-CM | POA: Diagnosis not present

## 2022-02-23 DIAGNOSIS — G4730 Sleep apnea, unspecified: Secondary | ICD-10-CM | POA: Insufficient documentation

## 2022-02-23 DIAGNOSIS — G8221 Paraplegia, complete: Secondary | ICD-10-CM | POA: Insufficient documentation

## 2022-02-23 DIAGNOSIS — L97821 Non-pressure chronic ulcer of other part of left lower leg limited to breakdown of skin: Secondary | ICD-10-CM | POA: Insufficient documentation

## 2022-02-23 DIAGNOSIS — I87332 Chronic venous hypertension (idiopathic) with ulcer and inflammation of left lower extremity: Secondary | ICD-10-CM | POA: Diagnosis not present

## 2022-02-23 DIAGNOSIS — L97521 Non-pressure chronic ulcer of other part of left foot limited to breakdown of skin: Secondary | ICD-10-CM | POA: Diagnosis not present
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
## 2022-02-24 NOTE — Progress Notes (Signed)
[name], [name] ([id number]) ?Visit Report for 02/23/2022 ?Arrival Information Details ?Patient Name: Date of Service: ?[name], [name] 02/23/2022 8:15 A M ?Medical Record Number: [id number] ?Patient Account Number: [id number] ?Date of Birth/Sex: Treating RN: ?04-30-1986 (36 y.o. [name] ?Primary Care Zygmund Passero: [place], [place] Other Clinician: ?Referring Juda Lajeunesse: ?Treating Dannon Perlow/Extender: Fredirick Maudlin ?[name], [name] ?[name]: 320 ?Visit Information History Since Last Visit ?Added or deleted any medications: No ?Patient Arrived: Ambulatory ?Any new allergies or adverse reactions: No ?Arrival Time: 08:09 ?Had a fall or experienced change in No ?Accompanied By: self ?activities of daily living that may affect ?Transfer Assistance: None ?risk of falls: ?Patient Identification Verified: Yes ?Signs or symptoms of abuse/neglect since last visito No ?Secondary Verification Process Completed: Yes ?Hospitalized since last visit: No ?Patient Requires Transmission-Based Precautions: No ?Implantable device outside of the clinic excluding No ?Patient Has Alerts: Yes ?cellular tissue based products placed in the center ?Patient Alerts: R ABI = 1.0 since last visit: ?L ABI = 1.1 Has Dressing in Place as Prescribed: Yes ?Has Compression in Place as Prescribed: Yes ?Pain Present Now: No ?Electronic Signature(s) ?Signed: 02/23/2022 5:04:19 PM By: Sharyn Creamer RN, BSN ?Entered By: Sharyn Creamer on 02/23/2022 08:13:05 ?-------------------------------------------------------------------------------- ?Encounter Discharge Information Details ?Patient Name: Date of Service: ?[name], [name] 02/23/2022 8:15 A M ?Medical Record Number: [id number] ?Patient Account Number: [id number] ?Date of Birth/Sex: Treating RN: ?February 06, 1986 (36 y.o. [name] ?Primary Care Kierria Feigenbaum: [place], [place] Other Clinician: ?Referring Oriah Leinweber: ?Treating Juniper Cobey/Extender: Fredirick Maudlin ?[place], [place] ?[name]: 320 ?Encounter  Discharge Information Items Post Procedure Vitals ?Discharge Condition: Stable ?Temperature (F): 98.3 ?Ambulatory Status: Wheelchair ?Pulse (bpm): 111 ?Discharge Destination: Home ?Respiratory Rate (breaths/min): 18 ?Transportation: Private Auto ?Blood Pressure (mmHg): 131/82 ?Accompanied By: self ?Schedule Follow-up Appointment: Yes ?Clinical Summary of Care: Patient Declined ?Electronic Signature(s) ?Signed: 02/23/2022 5:04:19 PM By: Sharyn Creamer RN, BSN ?Entered By: Sharyn Creamer on 02/23/2022 16:41:53 ?-------------------------------------------------------------------------------- ?Lower Extremity Assessment Details ?Patient Name: ?Date of Service: ?[name], [name] 02/23/2022 8:15 A M ?Medical Record Number: [id number] ?Patient Account Number: [id number] ?Date of Birth/Sex: ?Treating RN: ?06/27/1986 (36 y.o. [name] ?Primary Care Nala Kachel: [place], [place] ?Other Clinician: ?Referring Tyreck Bell: ?Treating Halvor Behrend/Extender: Fredirick Maudlin ?[place], [place] ?[name]: 320 ?Edema Assessment ?Assessed: [Left: No] [Right: No] ?Edema: [Left: Yes] [Right: Yes] ?Calf ?Left: Right: ?Point of Measurement: 33 cm From Medial Instep 28.7 cm 31.3 cm ?Ankle ?Left: Right: ?Point of Measurement: 10 cm From Medial Instep 25.2 cm 24.1 cm ?Vascular Assessment ?Pulses: ?Dorsalis Pedis ?Palpable: [Left:Yes] [Right:Yes] ?Electronic Signature(s) ?Signed: 02/23/2022 5:04:19 PM By: Sharyn Creamer RN, BSN ?Entered By: Sharyn Creamer on 02/23/2022 08:17:23 ?-------------------------------------------------------------------------------- ?Multi Wound Chart Details ?Patient Name: ?Date of Service: ?[name], [name] 02/23/2022 8:15 A M ?Medical Record Number: [id number] ?Patient Account Number: [id number] ?Date of Birth/Sex: ?Treating RN: ?[birth year] (36 y.o. [name] ?Primary Care Yuri Flener: [place], [place] ?Other Clinician: ?Referring Renelle Stegenga: ?Treating Acasia Skilton/Extender: Fredirick Maudlin ?[place], [place] ?[name]:  320 ?Vital Signs ?Height(in): 70 ?Pulse(bpm): 111 ?Weight(lbs): 216 ?Blood Pressure(mmHg): 131/82 ?Body Mass Index(BMI): 31 ?Temperature(??F): 98.3 ?Respiratory Rate(breaths/min): 18 ?Photos: ?Left Ischium Right, Dorsal Foot Right, Dorsal Foot ?Wound Location: ?Gradually Appeared Gradually Appeared Gradually Appeared ?Wounding Event: ?Pressure Ulcer Trauma, Other Trauma, Other ?Primary Etiology: ?Sleep Apnea, Hypertension, Paraplegia Sleep Apnea, Hypertension, Paraplegia Sleep Apnea, Hypertension, Paraplegia ?Comorbid History: ?03/16/2020 03/27/2021 03/27/2021 ?Date Acquired: ?71 47 47 ?Weeks of Treatment: ?Open Open Open ?Wound Status: ?Yes No No ?Wound Recurrence: ?Yes No No ?Clustered Wound: ?3 N/A N/A ?  Clustered Quantity: ?1.5x1.5x0.1 0.5x3.5x0.1 0.5x3.5x0.1 ?Measurements L x W x D (cm) ?1.767 1.374 1.374 ?A (cm?) : ?rea ?0.177 0.137 0.137 ?Volume (cm?) : ?2.20% 27.10% 27.10% ?% Reduction in A rea: ?2.20% 27.10% 27.10% ?% Reduction in Volume: ?Category/Stage II Full Thickness Without Exposed Full Thickness Without Exposed ?Classification: ?Support Structures Support Structures ?Medium Medium Medium ?Exudate A mount: ?Serosanguineous Serosanguineous Serosanguineous ?Exudate Type: ?red, brown red, brown red, brown ?Exudate Color: ?Distinct, outline attached Distinct, outline attached N/A ?Wound Margin: ?Large (67-100%) Small (1-33%) Medium (34-66%) ?Granulation A mount: ?Red, Friable Pink, Pale Pink ?Granulation Quality: ?None Present (0%) Large (67-100%) Medium (34-66%) ?Necrotic A mount: ?Fat Layer (Subcutaneous Tissue): Yes Fat Layer (Subcutaneous Tissue): Yes Fat Layer (Subcutaneous Tissue): Yes ?Exposed Structures: ?Fascia: No ?Fascia: No ?Fascia: No ?Tendon: No ?Tendon: No ?Tendon: No ?Muscle: No ?Muscle: No ?Muscle: No ?Joint: No ?Joint: No ?Joint: No ?Bone: No ?Bone: No ?Bone: No ?Medium (34-66%) Small (1-33%) Medium (34-66%) ?Epithelialization: ?Chemical/Enzymatic/Mechanical - Debridement - Selective/Open  Wound Debridement - Selective/Open Wound ?Debridement: ?Excisional ?Pre-procedure Verification/Time Out 08:44 08:44 08:44 ?Taken: ?Subcutaneous [facility] ?Tissue Debrided: ?N/A Non-Viable Tissue Non-Viable Tissue ?Level: ?N/A 1.5 1.5 ?Debridement A (sq cm): ?rea ?Other(silver nitrate) Curette Curette ?Instrument: ?Minimum Minimum Minimum ?Bleeding: ?Pressure Pressure Pressure ?Hemostasis A chieved: ?0 0 0 ?Procedural Pain: ?0 0 0 ?Post Procedural Pain: ?Procedure was tolerated well Procedure was tolerated well Procedure was tolerated well ?Debridement Treatment Response: ?1.5x1.5x0.1 0.5x3.5x0.1 0.5x3.5x0.1 ?Post Debridement Measurements L x ?W x D (cm) ?0.177 0.137 0.137 ?Post Debridement Volume: (cm?) ?N/A Debridement Debridement ?Procedures Performed: ?Wound Number: [id number] ?Photos: ?Left, Dorsal Foot Right, Plantar Foot Right, Medial Lower Leg ?Wound Location: ?Gradually Appeared Gradually Appeared Gradually Appeared ?Wounding Event: ?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 ?Primary Etiology: ?Sleep Apnea, Hypertension, Paraplegia Sleep Apnea, Hypertension, Paraplegia Sleep Apnea, Hypertension, Paraplegia ?Comorbid History: ?07/11/2021 08/25/2021 01/10/2022 ?Date Acquired: ?'31 26 6 '$ ?Weeks of Treatment: ?Open Open Open ?Wound Status: ?No No No ?Wound Recurrence: ?Yes No No ?Clustered Wound: ?N/A N/A N/A ?Clustered Quantity: ?2.8x0.8x0.1 0.1x0.1x0.1 0.1x0.1x0.1 ?Measurements L x W x D (cm) ?1.759 0.008 0.008 ?A (cm?) : ?rea ?0.176 0.001 0.001 ?Volume (cm?) : ?71.70% 99.20% 98.70% ?% Reduction in A rea: ?71.70% 99.00% 98.40% ?% Reduction in Volume: ?Full Thickness Without Exposed Full Thickness Without Exposed Full Thickness Without Exposed ?Classification: ?Support Structures Support Structures Support Structures ?Medium Medium Medium ?Exudate Amount: ?Serosanguineous Serosanguineous Serosanguineous ?Exudate Type: ?red, brown red, brown red, brown ?Exudate  Color: ?Distinct, outline attached Distinct, outline attached Flat and Intact ?Wound Margin: ?Large (67-100%) None Present (0%) None Present (0%) ?Granulation Amount: ?Pink, Pale N/A N/A ?Granulation Quality: ?Small

## 2022-03-09 ENCOUNTER — Encounter (HOSPITAL_BASED_OUTPATIENT_CLINIC_OR_DEPARTMENT_OTHER): Payer: BC Managed Care – PPO | Admitting: General Surgery

## 2022-03-10 DIAGNOSIS — S91309A Unspecified open wound, unspecified foot, initial encounter: Secondary | ICD-10-CM | POA: Diagnosis not present

## 2022-03-10 DIAGNOSIS — L89323 Pressure ulcer of left buttock, stage 3: Secondary | ICD-10-CM | POA: Diagnosis not present

## 2022-03-16 ENCOUNTER — Encounter (HOSPITAL_BASED_OUTPATIENT_CLINIC_OR_DEPARTMENT_OTHER): Payer: BC Managed Care – PPO | Admitting: General Surgery

## 2022-03-16 DIAGNOSIS — L97521 Non-pressure chronic ulcer of other part of left foot limited to breakdown of skin: Secondary | ICD-10-CM | POA: Diagnosis not present

## 2022-03-16 DIAGNOSIS — L97511 Non-pressure chronic ulcer of other part of right foot limited to breakdown of skin: Secondary | ICD-10-CM | POA: Diagnosis not present

## 2022-03-16 DIAGNOSIS — L97518 Non-pressure chronic ulcer of other part of right foot with other specified severity: Secondary | ICD-10-CM | POA: Diagnosis not present

## 2022-03-16 DIAGNOSIS — G8221 Paraplegia, complete: Secondary | ICD-10-CM | POA: Diagnosis not present

## 2022-03-16 DIAGNOSIS — I10 Essential (primary) hypertension: Secondary | ICD-10-CM | POA: Diagnosis not present

## 2022-03-16 DIAGNOSIS — L89323 Pressure ulcer of left buttock, stage 3: Secondary | ICD-10-CM | POA: Diagnosis not present

## 2022-03-16 DIAGNOSIS — I87332 Chronic venous hypertension (idiopathic) with ulcer and inflammation of left lower extremity: Secondary | ICD-10-CM | POA: Diagnosis not present

## 2022-03-16 DIAGNOSIS — G4730 Sleep apnea, unspecified: Secondary | ICD-10-CM | POA: Diagnosis not present

## 2022-03-16 DIAGNOSIS — L97821 Non-pressure chronic ulcer of other part of left lower leg limited to breakdown of skin: Secondary | ICD-10-CM | POA: Diagnosis not present

## 2022-03-22 NOTE — Progress Notes (Signed)
[name], [name] ([id number]) ?Visit Report for 03/16/2022 ?Arrival Information Details ?Patient Name: Date of Service: ?[name], [name] 03/16/2022 8:15 A M ?Medical Record Number: [id number] ?Patient Account Number: [id number] ?Date of Birth/Sex: Treating RN: ?1986/02/08 (36 y.o. [name] ?Primary Care Rollin Kotowski: [place], [place] Other Clinician: ?Referring Lashaunta Sicard: ?Treating Lani Mendiola/Extender: Fredirick Maudlin ?[place], [place] ?[name]: 323 ?Visit Information History Since Last Visit ?Added or deleted any medications: No ?Patient Arrived: Wheel Chair ?Any new allergies or adverse reactions: No ?Arrival Time: 08:26 ?Had a fall or experienced change in No ?Accompanied By: alone ?activities of daily living that may affect ?Transfer Assistance: Manual ?risk of falls: ?Patient Identification Verified: Yes ?Signs or symptoms of abuse/neglect since last visito No ?Secondary Verification Process Completed: Yes ?Hospitalized since last visit: No ?Patient Requires Transmission-Based Precautions: No ?Implantable device outside of the clinic excluding No ?Patient Has Alerts: Yes ?cellular tissue based products placed in the center ?Patient Alerts: R ABI = 1.0 since last visit: ?L ABI = 1.1 Has Dressing in Place as Prescribed: Yes ?Has Compression in Place as Prescribed: Yes ?Pain Present Now: No ?Electronic Signature(s) ?Signed: 03/17/2022 5:38:47 PM By: Levan Hurst RN, BSN ?Entered By: Levan Hurst on 03/16/2022 08:27:20 ?-------------------------------------------------------------------------------- ?Encounter Discharge Information Details ?Patient Name: Date of Service: ?[name], [name] 03/16/2022 8:15 A M ?Medical Record Number: [id number] ?Patient Account Number: [id number] ?Date of Birth/Sex: Treating RN: ?Jun 08, 1986 (36 y.o. [name] ?Primary Care Antoine Fiallos: [place], [place] Other Clinician: ?Referring Severino Paolo: ?Treating Camyla Camposano/Extender: Fredirick Maudlin ?[place], [place] ?[name]:  323 ?Encounter Discharge Information Items Post Procedure Vitals ?Discharge Condition: Stable ?Temperature (F): 98.1 ?Ambulatory Status: Wheelchair ?Pulse (bpm): 125 ?Discharge Destination: Home ?Respiratory Rate (breaths/min): 18 ?Transportation: Private Auto ?Blood Pressure (mmHg): 142/83 ?Accompanied By: self ?Schedule Follow-up Appointment: Yes ?Clinical Summary of Care: Patient Declined ?Electronic Signature(s) ?Signed: 03/22/2022 5:16:31 PM By: Adline Peals ?Entered By: Adline Peals on 03/16/2022 09:24:01 ?-------------------------------------------------------------------------------- ?Lower Extremity Assessment Details ?Patient Name: ?Date of Service: ?[name], [name] 03/16/2022 8:15 A M ?Medical Record Number: [id number] ?Patient Account Number: [id number] ?Date of Birth/Sex: ?Treating RN: ?23-Dec-1985 (36 y.o. [name] ?Primary Care Maidie Streight: [place], [place] ?Other Clinician: ?Referring Katee Wentland: ?Treating Rashea Hoskie/Extender: Fredirick Maudlin ?[place], [place] ?[name]: 323 ?Edema Assessment ?Assessed: [Left: No] [Right: No] ?Edema: [Left: Yes] [Right: Yes] ?Calf ?Left: Right: ?Point of Measurement: 33 cm From Medial Instep 28 cm 31.5 cm ?Ankle ?Left: Right: ?Point of Measurement: 10 cm From Medial Instep 25 cm 24 cm ?Vascular Assessment ?Pulses: ?Dorsalis Pedis ?Palpable: [Left:Yes] [Right:Yes] ?Electronic Signature(s) ?Signed: 03/17/2022 5:38:47 PM By: Levan Hurst RN, BSN ?Entered By: Levan Hurst on 03/16/2022 08:28:02 ?-------------------------------------------------------------------------------- ?Multi Wound Chart Details ?Patient Name: ?Date of Service: ?[name], [name] 03/16/2022 8:15 A M ?Medical Record Number: [id number] ?Patient Account Number: [id number] ?Date of Birth/Sex: ?Treating RN: ?08-19-1986 (36 y.o. [name] ?Primary Care Hinley Brimage: [place], [place] ?Other Clinician: ?Referring Xzavion Doswell: ?Treating Liviana Mills/Extender: Fredirick Maudlin ?[place], [place] ?Weeks in  Treatment: 323 ?Vital Signs ?Height(in): 70 ?Pulse(bpm): 125 ?Weight(lbs): 216 ?Blood Pressure(mmHg): 142/83 ?Body Mass Index(BMI): 31 ?Temperature(??F): 98.1 ?Respiratory Rate(breaths/min): 18 ?Photos: ?Left Ischium Right, Dorsal Foot Left, Dorsal Foot ?Wound Location: ?Gradually Appeared Gradually Appeared Gradually Appeared ?Wounding Event: ?Pressure Ulcer Trauma, Other Neuropathic Ulcer-Non Diabetic ?Primary Etiology: ?Sleep Apnea, Hypertension, Paraplegia Sleep Apnea, Hypertension, Paraplegia Sleep Apnea, Hypertension, Paraplegia ?Comorbid History: ?03/16/2020 03/27/2021 07/11/2021 ?Date Acquired: ?601 09 32 ?Weeks of Treatment: ?Open Open Open ?Wound Status: ?Yes No No ?Wound Recurrence: ?Yes No Yes ?Clustered Wound: ?3 N/A N/A ?  Clustered Quantity: ?0.5x0.5x0.1 0.6x3.2x0.2 1x1.2x0.1 ?Measurements L x W x D (cm) ?0.196 1.508 0.942 ?A (cm?) : ?rea ?0.02 0.302 0.094 ?Volume (cm?) : ?89.10% 20.00% 84.90% ?% Reduction in A rea: ?89.00% -60.60% 84.90% ?% Reduction in Volume: ?Category/Stage II Full Thickness Without Exposed Full Thickness Without Exposed ?Classification: ?Support Structures Support Structures ?Medium Medium Medium ?Exudate A mount: ?Serosanguineous Serosanguineous Serosanguineous ?Exudate Type: ?red, brown red, brown red, brown ?Exudate Color: ?Distinct, outline attached Flat and Intact Distinct, outline attached ?Wound Margin: ?Large (67-100%) Medium (34-66%) Large (67-100%) ?Granulation A mount: ?Pink Pink Red, Pink ?Granulation Quality: ?Small (1-33%) Medium (34-66%) Small (1-33%) ?Necrotic A mount: ?Fat Layer (Subcutaneous Tissue): Yes Fat Layer (Subcutaneous Tissue): Yes Fat Layer (Subcutaneous Tissue): Yes ?Exposed Structures: ?Fascia: No ?Fascia: No ?Fascia: No ?Tendon: No ?Tendon: No ?Tendon: No ?Muscle: No ?Muscle: No ?Muscle: No ?Joint: No ?Joint: No ?Joint: No ?Bone: No ?Bone: No ?Bone: No ?Medium (34-66%) Medium (34-66%) Medium (34-66%) ?Epithelialization: ?N/A Debridement - Selective/Open  Wound Debridement - Selective/Open Wound ?Debridement: ?Pre-procedure Verification/Time Out N/A 08:53 08:53 ?Taken: ?N/A Other Other ?Pain Control: ?[place] ?Tissue Debrided: ?N/A Non-Viable Tissue Non-Viable Tissue ?Level: ?N/A 1.92 0.25 ?Debridement A (sq cm): ?rea ?N/A Curette Curette ?Instrument: ?N/A Minimum Minimum ?Bleeding: ?N/A Pressure Pressure ?Hemostasis A chieved: ?N/A 0 0 ?Procedural Pain: ?N/A 0 0 ?Post Procedural Pain: ?N/A Procedure was tolerated well Procedure was tolerated well ?Debridement Treatment Response: ?N/A 0.6x3.2x0.2 1x1.2x0.1 ?Post Debridement Measurements L x ?W x D (cm) ?N/A 0.302 0.094 ?Post Debridement Volume: (cm?) ?N/A Debridement Debridement ?Procedures Performed: ?Wound Number: 61 N/A N/A ?Photos: N/A N/A ?Right, Medial Lower Leg N/A N/A ?Wound Location: ?Gradually Appeared N/A N/A ?Wounding Event: ?Neuropathic Ulcer-Non Diabetic N/A N/A ?Primary Etiology: ?Sleep Apnea, Hypertension, Paraplegia N/A N/A ?Comorbid History: ?01/10/2022 N/A N/A ?Date Acquired: ?71 N/A N/A ?Weeks of Treatment: ?Healed - Epithelialized N/A N/A ?Wound Status: ?No N/A N/A ?Wound Recurrence: ?No N/A N/A ?Clustered Wound: ?N/A N/A N/A ?Clustered Quantity: ?0x0x0 N/A N/A ?Measurements L x W x D (cm) ?0 N/A N/A ?A (cm?) : ?rea ?0 N/A N/A ?Volume (cm?) : ?100.00% N/A N/A ?% Reduction in A rea: ?100.00% N/A N/A ?% Reduction in Volume: ?Full Thickness Without Exposed N/A N/A ?Classification: ?Support Structures ?None Present N/A N/A ?Exudate Amount: ?N/A N/A N/A ?Exudate Type: ?N/A N/A N/A ?Exudate Color: ?Flat and Intact N/A N/A ?Wound Margin: ?None Present (0%) N/A N/A ?Granulation Amount: ?N/A N/A N/A ?Granulation Quality: ?None Present (0%) N/A N/A ?Necrotic Amount: ?Fascia: No N/A N/A ?Exposed Structures: ?Fat Layer (Subcutaneous Tissue): No ?Tendon: No ?Muscle: No ?Joint: No ?Bone: No ?Large (67-100%) N/A N/A ?Epithelialization: ?N/A N/A N/A ?Debridement: ?N/A N/A N/A ?Pain  Control: ?N/A N/A N/A ?Tissue Debrided: ?N/A N/A N/A ?Level: ?N/A N/A N/A ?Debridement A (sq cm): ?rea ?N/A N/A N/A ?Instrument: ?N/A N/A N/A ?Bleeding: ?N/A N/A N/A ?Hemostasis A chieved: ?N/A N/A N/A ?Procedural
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
## 2022-03-30 ENCOUNTER — Encounter (HOSPITAL_BASED_OUTPATIENT_CLINIC_OR_DEPARTMENT_OTHER): Payer: BC Managed Care – PPO | Attending: General Surgery | Admitting: General Surgery

## 2022-03-30 DIAGNOSIS — I87332 Chronic venous hypertension (idiopathic) with ulcer and inflammation of left lower extremity: Secondary | ICD-10-CM | POA: Insufficient documentation

## 2022-03-30 DIAGNOSIS — Z8614 Personal history of Methicillin resistant Staphylococcus aureus infection: Secondary | ICD-10-CM | POA: Insufficient documentation

## 2022-03-30 DIAGNOSIS — L97521 Non-pressure chronic ulcer of other part of left foot limited to breakdown of skin: Secondary | ICD-10-CM | POA: Diagnosis not present

## 2022-03-30 DIAGNOSIS — G8220 Paraplegia, unspecified: Secondary | ICD-10-CM | POA: Insufficient documentation

## 2022-03-30 DIAGNOSIS — L97821 Non-pressure chronic ulcer of other part of left lower leg limited to breakdown of skin: Secondary | ICD-10-CM | POA: Diagnosis not present

## 2022-03-30 DIAGNOSIS — L97511 Non-pressure chronic ulcer of other part of right foot limited to breakdown of skin: Secondary | ICD-10-CM | POA: Insufficient documentation

## 2022-03-30 DIAGNOSIS — L97518 Non-pressure chronic ulcer of other part of right foot with other specified severity: Secondary | ICD-10-CM | POA: Insufficient documentation

## 2022-03-30 DIAGNOSIS — Z9081 Acquired absence of spleen: Secondary | ICD-10-CM | POA: Insufficient documentation

## 2022-03-30 DIAGNOSIS — L89323 Pressure ulcer of left buttock, stage 3: Secondary | ICD-10-CM | POA: Diagnosis not present

## 2022-03-30 DIAGNOSIS — Z87891 Personal history of nicotine dependence: Secondary | ICD-10-CM | POA: Diagnosis not present

## 2022-03-30 DIAGNOSIS — Z89422 Acquired absence of other left toe(s): Secondary | ICD-10-CM | POA: Diagnosis not present

## 2022-04-01 NOTE — Progress Notes (Signed)
[name], [name] ([id number]) ?Visit Report for 03/30/2022 ?Arrival Information Details ?Patient Name: Date of Service: ?[name], [name] 03/30/2022 8:15 [name] ?Medical Record Number: [id number] ?Patient Account Number: [id number] ?Date of Birth/Sex: Treating RN: ?01-18-1986 (36 y.o. [name] ?Primary Care Koula Venier: [place], [place] Other Clinician: ?Referring Yizel Canby: ?Treating Maziyah Vessel/Extender: Fredirick Maudlin ?[place], [place] ?[name]: 325 ?Visit Information History Since Last Visit ?Added or deleted any medications: No ?Patient Arrived: Wheel Chair ?Any new allergies or adverse reactions: No ?Arrival Time: 08:24 ?Had [name] fall or experienced change in No ?Accompanied By: self ?activities of daily living that may affect ?Transfer Assistance: Manual ?risk of falls: ?Patient Identification Verified: Yes ?Signs or symptoms of abuse/neglect since last visito No ?Secondary Verification Process Completed: Yes ?Hospitalized since last visit: No ?Patient Requires Transmission-Based Precautions: No ?Implantable device outside of the clinic excluding No ?Patient Has Alerts: Yes ?cellular tissue based products placed in the center ?Patient Alerts: R ABI = 1.0 since last visit: ?L ABI = 1.1 Has Dressing in Place as Prescribed: Yes ?Pain Present Now: No ?Electronic Signature(s) ?Signed: 03/31/2022 7:47:55 AM By: Sandre Kitty ?Entered By: Sandre Kitty on 03/30/2022 08:25:05 ?-------------------------------------------------------------------------------- ?Encounter Discharge Information Details ?Patient Name: Date of Service: ?[name], [name] 03/30/2022 8:15 [name] ?Medical Record Number: [id number] ?Patient Account Number: [id number] ?Date of Birth/Sex: Treating RN: ?October 18, 1986 (36 y.o. [name] ?Primary Care Kymir Coles: [place], [place] Other Clinician: ?Referring Daivd Fredericksen: ?Treating Orene Abbasi/Extender: Fredirick Maudlin ?[place], [place] ?[name]: 325 ?Encounter Discharge Information Items Post Procedure  Vitals ?Discharge Condition: Stable ?Temperature (F): 98.2 ?Ambulatory Status: Wheelchair ?Pulse (bpm): 118 ?Discharge Destination: Home ?Respiratory Rate (breaths/min): 18 ?Transportation: Private Auto ?Blood Pressure (mmHg): 136/77 ?Accompanied By: alone ?Schedule Follow-up Appointment: Yes ?Clinical Summary of Care: Patient Declined ?Electronic Signature(s) ?Signed: 04/01/2022 5:21:10 PM By: Levan Hurst RN, BSN ?Entered By: Levan Hurst on 03/30/2022 09:19:55 ?-------------------------------------------------------------------------------- ?Lower Extremity Assessment Details ?Patient Name: ?Date of Service: ?[name], [name] 03/30/2022 8:15 [name] ?Medical Record Number: [id number] ?Patient Account Number: [id number] ?Date of Birth/Sex: ?Treating RN: ?Apr 16, 1986 (36 y.o. [name] ?Primary Care Eligah Anello: [place], [place] ?Other Clinician: ?Referring Vidhi Delellis: ?Treating Jaydi Bray/Extender: Fredirick Maudlin ?[place], [place] ?[name]: 325 ?Edema Assessment ?Assessed: [Left: No] [Right: No] ?Edema: [Left: Yes] [Right: Yes] ?Calf ?Left: Right: ?Point of Measurement: 33 cm From Medial Instep 28 cm 31.5 cm ?Ankle ?Left: Right: ?Point of Measurement: 10 cm From Medial Instep 25 cm 24 cm ?Vascular Assessment ?Pulses: ?Dorsalis Pedis ?Palpable: [Left:Yes] [Right:Yes] ?Electronic Signature(s) ?Signed: 04/01/2022 5:21:10 PM By: Levan Hurst RN, BSN ?Entered By: Levan Hurst on 03/30/2022 08:47:48 ?-------------------------------------------------------------------------------- ?Multi Wound Chart Details ?Patient Name: ?Date of Service: ?[name], [name] 03/30/2022 8:15 [name] ?Medical Record Number: [id number] ?Patient Account Number: [id number] ?Date of Birth/Sex: ?Treating RN: ?[birth year] (36 y.o. [name] ?Primary Care Geovanni Rahming: [place], [place] ?Other Clinician: ?Referring Ocean Schildt: ?Treating Verlon Carcione/Extender: Fredirick Maudlin ?[place], [place] ?[name]: 325 ?Vital Signs ?Height(in): 70 ?Pulse(bpm):  118 ?Weight(lbs): 216 ?Blood Pressure(mmHg): 136/77 ?Body Mass Index(BMI): 31 ?Temperature(??F): 98.2 ?Respiratory Rate(breaths/min): 18 ?Photos: [41R:Left Ischium] [52:Right, Dorsal Foot] [56:Left, Dorsal Foot] ?Wound Location: [41R:Gradually Appeared] [52:Gradually Appeared] [56:Gradually Appeared] ?Wounding Event: [41R:Pressure Ulcer] [52:Trauma, Other] [56:Neuropathic Ulcer-Non Diabetic] ?Primary Etiology: [41R:Sleep Apnea, Hypertension, Paraplegia Sleep Apnea, Hypertension, Paraplegia Sleep Apnea, Hypertension, Paraplegia] ?Comorbid History: [41R:03/16/2020] [52:03/27/2021] [56:07/11/2021] ?Date Acquired: [41R:106] [52:52] [56:36] ?Weeks of Treatment: [41R:Open] [52:Open] [56:Open] ?Wound Status: [41R:Yes] [52:No] [56:No] ?Wound Recurrence: [41R:Yes] [52:No] [56:Yes] ?Clustered Wound: [41R:3] [52:[name]] [56:[name]] ?Clustered Quantity: [41R:2.5x0.6x0.1] [52:0.3x1x0.1] [56:0.8x0.8x0.1] ?Measurements L  x W x D (cm) [41R:1.178] [52:0.236] [56:8.616] ?[name] (cm?) : ?rea [41R:0.118] [52:0.024] [56:0.05] ?Volume (cm?) : [41R:34.80%] [52:87.50%] [56:91.90%] ?% Reduction in [name] rea: [41R:34.80%] [52:87.20%] [56:92.00%] ?% Reduction in Volume: [41R:Category/Stage II] [52:Full Thickness Without Exposed] [56:Full Thickness Without Exposed] ?Classification: [41R:Medium] [52:Support Structures Medium] [56:Support Structures Medium] ?Exudate [name] mount: [41R:Serosanguineous] [52:Serosanguineous] [56:Serosanguineous] ?Exudate Type: [41R:red, brown] [52:red, brown] [56:red, brown] ?Exudate Color: [41R:Distinct, outline attached] [52:Flat and Intact] [56:Flat and Intact] ?Wound Margin: [41R:Large (67-100%)] [52:Large (67-100%)] [56:Small (1-33%)] ?Granulation [name] mount: [41R:Red, Pink] [52:Red, Pink] [56:Pink, Pale] ?Granulation Quality: [41R:Small (1-33%)] [52:Small (1-33%)] [56:Large (67-100%)] ?Necrotic [name] mount: [41R:Adherent Slough] [52:Adherent Slough] [56:Adherent Slough] ?Necrotic Tissue: ?[41R:Fat Layer (Subcutaneous Tissue): Yes Fat Layer  (Subcutaneous Tissue): Yes Fat Layer (Subcutaneous Tissue): Yes] ?Exposed Structures: ?[41R:Fascia: No Tendon: No Muscle: No Joint: No Bone: No Small (1-33%)] [52:Fascia: No Tendon: No Muscle: No Joint: No Bone: No Medium (34-66%)] [56:Fascia: No Tendon: No Muscle: No Joint: No Bone: No Medium (34-66%)] ?Epithelialization: [41R:Debridement - Selective/Open Wound Debridement - Selective/Open Wound Debridement - Selective/Open Wound] ?Debridement: ?Pre-procedure Verification/Time Out 08:53 [52:08:53] [56:08:53] ?Taken: [41R:Slough] [52:Slough] [56:Necrotic/Eschar, Slough] ?Tissue Debrided: [41R:Non-Viable Tissue] [52:Non-Viable Tissue] [56:Non-Viable Tissue] ?Level: [41R:1.5] [52:0.3] [83:7.29] ?Debridement [name] (sq cm): [41R:rea Curette] [52:Curette] [56:Curette] ?Instrument: [41R:Minimum] [52:Minimum] [56:Minimum] ?Bleeding: [41R:Pressure] [52:Pressure] [56:Pressure] ?Hemostasis [name] chieved: [41R:0] [52:0] [56:0] ?Procedural Pain: [41R:0] [52:0] [56:0] ?Post Procedural Pain: [41R:Procedure was tolerated well] [52:Procedure was tolerated well] [56:Procedure was tolerated well] ?Debridement Treatment Response: [41R:2.5x0.6x0.1] [52:0.3x1x0.1] [56:0.8x0.8x0.1] ?Post Debridement Measurements L x ?W x D (cm) [41R:0.118] [52:0.024] [56:0.05] ?Post Debridement Volume: (cm?) [41R:Category/Stage II] [52:[name]] [56:[name]] ?Post Debridement Stage: [41R:Debridement] [52:Debridement] [56:Debridement] ?Wound Number: [id number] [name] [name] ?Photos: [name] [name] ?Right, Anterior Ankle [name] [name] ?Wound Location: ?Gradually Appeared [name] [name] ?Wounding Event: ?Venous Leg Ulcer [name] [name] ?Primary Etiology: ?Sleep Apnea, Hypertension, Paraplegia [name] [name] ?Comorbid History: ?03/30/2022 [name] [name] ?Date Acquired: ?0 [name] [name] ?Weeks of Treatment: ?Open [name] [name] ?Wound Status: ?No [name] [name] ?Wound Recurrence: ?No [name] [name] ?Clustered Wound: ?[name] [name] [name] ?Clustered Quantity: ?0.7x0.9x0.1 [name] [name] ?Measurements L x W x D (cm) ?0.495 [name] [name] ?[name] (cm?) : ?rea ?0.049 [name] [name] ?Volume (cm?)  : ?0.00% [name] [name] ?% Reduction in [name] rea: ?0.00% [name] [name] ?% Reduction in Volume: ?Full Thickness Without Exposed [name] [name] ?Classification: ?Support Structures ?Medium [name] [name] ?Exudate Amount: ?Serosanguineous [name]
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
## 2022-04-05 ENCOUNTER — Ambulatory Visit: Payer: BC Managed Care – PPO | Admitting: Behavioral Health

## 2022-04-07 ENCOUNTER — Ambulatory Visit: Payer: BC Managed Care – PPO | Admitting: Behavioral Health

## 2022-04-07 ENCOUNTER — Encounter: Payer: Self-pay | Admitting: Behavioral Health

## 2022-04-07 DIAGNOSIS — F341 Dysthymic disorder: Secondary | ICD-10-CM | POA: Diagnosis not present

## 2022-04-07 MED ORDER — SERTRALINE HCL 100 MG PO TABS: ORAL_TABLET | ORAL | 4 refills | Status: DC

## 2022-04-07 NOTE — Progress Notes (Signed)
[facility] ? ?Patient ID: [name],  ?MRN: [id number] ? ?PCP: Janine Limbo, PA-C ? ?Date of Evaluation: 04/07/2022 ?Time spent:20 minutes ? ?Chief Complaint:  ?Chief Complaint   ?Anxiety; Depression; Follow-up; Medication Refill ?  ? ? ?HISTORY/CURRENT STATUS: ?HPI ?36 year old male presents to this office for follow up and medication refill.  He says that he continues to do very well and is stable. He does not want to make any changes to his medications at this time. He reports 0/10 depression, and 0/10 anxiety. He sleeps 7 plus hours per night. Denies any significant social changes. He is wheelchair bound and has non healing wounds on both legs bilaterally. He goes to wound care once weekly. No changes in his social life. He denies mania, no psychosis. No SI/HI.  ?  ?No prior psychiatric medication failures ? ? ? ? ?Individual Medical History/ Review of Systems: Changes? :No  ? ?Allergies: Sulfa antibiotics, Levofloxacin, Meropenem, Penicillins, and Zyvox [linezolid] ? ?Current Medications:  ?Current Outpatient Medications:  ?  Amino Acids-Protein Hydrolys (FEEDING SUPPLEMENT, PRO-STAT SUGAR FREE 64,) LIQD, Take 30 mLs by mouth 2 (two) times daily., Disp: , Rfl:  ?  Ascorbic Acid (VITAMIN C) 1000 MG tablet, Take 1,000 mg by mouth daily., Disp: , Rfl:  ?  baclofen (LIORESAL) 20 MG tablet, Take 40-60 mg by mouth 3 (three) times daily. '60mg'$  in the morning, '40mg'$  in the afternoon, and '60mg'$  in the evening, Disp: , Rfl:  ?  imipramine (TOFRANIL) 50 MG tablet, Take 50 mg by mouth at bedtime. , Disp: , Rfl:  ?  lisinopril-hydrochlorothiazide (PRINZIDE,ZESTORETIC) 20-25 MG per tablet, Take 1 tablet by mouth daily., Disp: , Rfl:  ?  Melatonin 10 MG CAPS, Take by mouth. Nightly, Disp: , Rfl:  ?  Multiple Vitamin (MULTIVITAMIN) tablet, Take 1 tablet by mouth daily., Disp: , Rfl:  ?  Omadacycline Tosylate (NUZYRA) 150 MG TABS, Take 3 tablets by mouth daily for 2 days, then take 2 tablets by mouth daily for 6  days., Disp: 18 tablet, Rfl: 0 ?  oxybutynin (DITROPAN XL) 15 MG 24 hr tablet, Take 30 mg by mouth at bedtime. , Disp: , Rfl:  ?  sertraline (ZOLOFT) 100 MG tablet, TAKE 1 TABLET(100 MG) BY MOUTH DAILY AFTER BREAKFAST, Disp: 30 tablet, Rfl: 4 ?  zinc sulfate 220 (50 Zn) MG capsule, Take 220 mg by mouth daily., Disp: , Rfl:  ?Medication Side Effects: none ? ?Family Medical/ Social History: Changes? No ? ?MENTAL HEALTH EXAM: ? ?There were no vitals taken for this visit.There is no height or weight on file to calculate BMI.  ?General Appearance: Casual and Neat  ?Eye Contact:  Good  ?Speech:  Clear and Coherent  ?Volume:  Normal  ?Mood:  NA  ?Affect:  Appropriate  ?Thought Process:  Coherent  ?Orientation:  Full (Time, Place, and Person)  ?Thought Content: Logical   ?Suicidal Thoughts:  No  ?Homicidal Thoughts:  No  ?Memory:  WNL  ?Judgement:  Good  ?Insight:  Good  ?Psychomotor Activity:  Normal  ?Concentration:  Concentration: Good  ?Recall:  Good  ?Fund of Knowledge: Good  ?Language: Good  ?Assets:  Desire for Improvement  ?ADL's:  Intact  ?Cognition: WNL  ?Prognosis:  Good  ? ? ?DIAGNOSES:  ?  ICD-10-CM   ?1. 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  ? ? ?Receiving Psychotherapy: No  ? ? ?RECOMMENDATIONS:  ? ?Greater than 50% of  20  min face to face time with patient was spent on counseling and coordination of care. Marland Kitchen He continue to be stable and wants to continue with Zoloft 100 mg. He is requesting to  follow up in  6 months. ? He is escribed Zoloft 100 mg tablet every morning [facility] as [facility] [address] [facility]hymia.  He has CPAP and Tofranil for sleep apnea and neurogenic bladder from his medical services.   He does take melatonin 10 mg nightly as needed for insomnia OTC.   ?Will report worsening symptoms promptly ?Provided emergency contact information ?Reviewed PDMP ?  ? ? ?Elwanda [place], NP  ?

## 2022-04-13 ENCOUNTER — Encounter (HOSPITAL_BASED_OUTPATIENT_CLINIC_OR_DEPARTMENT_OTHER): Payer: BC Managed Care – PPO | Admitting: General Surgery

## 2022-04-13 DIAGNOSIS — Z8614 Personal history of Methicillin resistant Staphylococcus aureus infection: Secondary | ICD-10-CM | POA: Diagnosis not present

## 2022-04-13 DIAGNOSIS — Z9081 Acquired absence of spleen: Secondary | ICD-10-CM | POA: Diagnosis not present

## 2022-04-13 DIAGNOSIS — I87332 Chronic venous hypertension (idiopathic) with ulcer and inflammation of left lower extremity: Secondary | ICD-10-CM | POA: Diagnosis not present

## 2022-04-13 DIAGNOSIS — Z87891 Personal history of nicotine dependence: Secondary | ICD-10-CM | POA: Diagnosis not present

## 2022-04-13 DIAGNOSIS — L97521 Non-pressure chronic ulcer of other part of left foot limited to breakdown of skin: Secondary | ICD-10-CM | POA: Diagnosis not present

## 2022-04-13 DIAGNOSIS — G8220 Paraplegia, unspecified: Secondary | ICD-10-CM | POA: Diagnosis not present

## 2022-04-13 DIAGNOSIS — L97821 Non-pressure chronic ulcer of other part of left lower leg limited to breakdown of skin: Secondary | ICD-10-CM | POA: Diagnosis not present

## 2022-04-13 DIAGNOSIS — L97511 Non-pressure chronic ulcer of other part of right foot limited to breakdown of skin: Secondary | ICD-10-CM | POA: Diagnosis not present

## 2022-04-13 DIAGNOSIS — L97518 Non-pressure chronic ulcer of other part of right foot with other specified severity: Secondary | ICD-10-CM | POA: Diagnosis not present

## 2022-04-13 DIAGNOSIS — L89323 Pressure ulcer of left buttock, stage 3: Secondary | ICD-10-CM | POA: Diagnosis not present

## 2022-04-13 DIAGNOSIS — Z89422 Acquired absence of other left toe(s): Secondary | ICD-10-CM | POA: Diagnosis not present

## 2022-04-20 NOTE — Progress Notes (Signed)
[name], NEKODA [name] ([id number]) Visit Report for 04/13/2022 Arrival Information Details Patient Name: Date of Service: [name] Side 04/13/2022 8:15 [name] Medical Record Number: [id number] Patient Account Number: [id number] Date of Birth/Sex: Treating RN: 04/30/1986 (36 y.o. [name] Primary Care Rosamaria Donn: [place], [place] Other Clinician: Referring Jelisha Weed: Treating Nollie Shiflett/Extender: Cornelia Copa [name]: 22 Visit Information History Since Last Visit Added or deleted any medications: No Patient Arrived: Wheel Chair Any new allergies or adverse reactions: No Arrival Time: 08:49 Had [name] fall or experienced change in No Accompanied By: self activities of daily living that may affect Transfer Assistance: None risk of falls: Patient Identification Verified: Yes Signs or symptoms of abuse/neglect since last visito No Secondary Verification Process Completed: Yes Hospitalized since last visit: No Patient Requires Transmission-Based Precautions: No Implantable device outside of the clinic excluding No Patient Has Alerts: Yes cellular tissue based products placed in the center Patient Alerts: R ABI = 1.0 since last visit: L ABI = 1.1 Has Dressing in Place as Prescribed: Yes Has Compression in Place as Prescribed: Yes Pain Present Now: No Electronic Signature(s) Signed: 04/20/2022 4:31:14 PM By: Sharyn Creamer RN, BSN Entered By: Sharyn Creamer on 04/13/2022 08:50:51 -------------------------------------------------------------------------------- Encounter Discharge Information Details Patient Name: Date of Service: [name], [name] 04/13/2022 8:15 [name] Medical Record Number: [id number] Patient Account Number: [id number] Date of Birth/Sex: Treating RN: 02/06/1986 (36 y.o. [name] Primary Care Thamara Leger: [place], [place] Other Clinician: Referring Weldon Nouri: Treating Harshika Mago/Extender: Cornelia Copa [name]: [phone] Encounter  Discharge Information Items Post Procedure Vitals Discharge Condition: Stable Temperature (F): 98.4 Ambulatory Status: Wheelchair Pulse (bpm): 98 Discharge Destination: Home Respiratory Rate (breaths/min): 18 Transportation: Private Auto Blood Pressure (mmHg): 130/83 Accompanied By: self Schedule Follow-up Appointment: Yes Clinical Summary of Care: Patient Declined Electronic Signature(s) Signed: 04/20/2022 4:31:14 PM By: Sharyn Creamer RN, BSN Entered By: Sharyn Creamer on 04/13/2022 09:36:35 -------------------------------------------------------------------------------- Lower Extremity Assessment Details Patient Name: Date of Service: [name], [name] 04/13/2022 8:15 [name] Medical Record Number: [id number] Patient Account Number: [id number] Date of Birth/Sex: Treating RN: [birth year] (36 y.o. [name] Primary Care Amberli Ruegg: [place], [place] Other Clinician: Referring Veleta Yamamoto: Treating Michaeljoseph Revolorio/Extender: Graciela Husbands, [name]: 327 Edema Assessment Assessed: [Left: No] [Right: No] Edema: [Left: Yes] [Right: Yes] Calf Left: Right: Point of Measurement: 33 cm From Medial Instep 32 cm 38.3 cm Ankle Left: Right: Point of Measurement: 10 cm From Medial Instep 25.5 cm 24.8 cm Vascular Assessment Pulses: Dorsalis Pedis Palpable: [Left:Yes] [Right:Yes] Electronic Signature(s) Signed: 04/20/2022 4:31:14 PM By: Sharyn Creamer RN, BSN Entered By: Sharyn Creamer on 04/13/2022 08:53:41 -------------------------------------------------------------------------------- Multi Wound Chart Details Patient Name: Date of Service: [name], [name] 04/13/2022 8:15 [name] Medical Record Number: [id number] Patient Account Number: [id number] Date of Birth/Sex: Treating RN: 1986-02-23 (36 y.o. [name] Primary Care Karene Bracken: [name], [name] Other Clinician: Referring Kaveri Perras: Treating Kerry Chisolm/Extender: Graciela Husbands, [name]:  327 Vital Signs Height(in): 70 Pulse(bpm): 69 Weight(lbs): 216 Blood Pressure(mmHg): 130/83 Body Mass Index(BMI): 31 Temperature(F): 98.4 Respiratory Rate(breaths/min): 18 Photos: Left Ischium Right, Dorsal Foot Left, Dorsal Foot Wound Location: Gradually Appeared Gradually Appeared Gradually Appeared Wounding Event: Pressure Ulcer Trauma, Other Neuropathic Ulcer-Non Diabetic Primary Etiology: Sleep Apnea, Hypertension, Paraplegia Sleep Apnea, Hypertension, Paraplegia Sleep Apnea, Hypertension, Paraplegia Comorbid History: 03/16/2020 03/27/2021 07/11/2021 Date Acquired: 108 54 38 Weeks of Treatment: Open Open Open Wound Status: Yes No No Wound Recurrence: Yes No Yes Clustered Wound: 3  [name] [name] Clustered Quantity: 3.4x2.8x0.1 0.1x0.1x0.1 2x0.5x0.1 Measurements L x W x D (cm) 7.477 0.008 0.785 [name] (cm) : rea 0.748 0.001 0.079 Volume (cm) : -314.00% 99.60% 87.40% % Reduction in [name] rea: -313.30% 99.50% 87.30% % Reduction in Volume: Category/Stage II Full Thickness Without Exposed Full Thickness Without Exposed Classification: Support Structures Support Structures Medium Medium Medium Exudate [name] mount: Serosanguineous Serosanguineous Serosanguineous Exudate Type: red, brown red, brown red, brown Exudate Color: Distinct, outline attached Flat and Intact Flat and Intact Wound Margin: Large (67-100%) Large (67-100%) Small (1-33%) Granulation [name] mount: Red, Friable Red, Pink Red Granulation Quality: Small (1-33%) Small (1-33%) Large (67-100%) Necrotic [name] mount: Fat Layer (Subcutaneous Tissue): Yes Fat Layer (Subcutaneous Tissue): Yes Fat Layer (Subcutaneous Tissue): Yes Exposed Structures: Fascia: No Fascia: No Fascia: No Tendon: No Tendon: No Tendon: No Muscle: No Muscle: No Muscle: No Joint: No Joint: No Joint: No Bone: No Bone: No Bone: No Small (1-33%) Large (67-100%) Large (67-100%) Epithelialization: [name] [name] Debridement - Selective/Open  Wound Debridement: Pre-procedure Verification/Time Out [name] [name] 09:18 Taken: [name] [name] Slough Tissue Debrided: [name] [name] Non-Viable Tissue Level: [name] [name] 2 Debridement [name] (sq cm): rea [name] [name] Curette Instrument: [name] [name] Minimum Bleeding: [name] [name] Pressure Hemostasis [name] chieved: [name] [name] 0 Procedural Pain: [name] [name] 0 Post Procedural Pain: [name] [name] Procedure was tolerated well Debridement Treatment Response: [name] [name] 2x0.5x0.1 Post Debridement Measurements L x W x D (cm) [name] [name] 0.079 Post Debridement Volume: (cm) [name] [name] Debridement Procedures Performed: Wound Number: 52 [name] [name] Photos: [name] [name] Right, Anterior Ankle [name] [name] Wound Location: Gradually Appeared [name] [name] Wounding Event: Venous Leg Ulcer [name] [name] Primary Etiology: Sleep Apnea, Hypertension, Paraplegia [name] [name] Comorbid History: 03/30/2022 [name] [name] Date Acquired: 2 [name] [name] Weeks of Treatment: Open [name] [name] Wound Status: No [name] [name] Wound Recurrence: No [name] [name] Clustered Wound: [name] [name] [name] Clustered Quantity: 1.2x1.2x0.1 [name] [name] Measurements L x W x D (cm) 1.131 [name] [name] [name] (cm) : rea 0.113 [name] [name] Volume (cm) : -128.50% [name] [name] % Reduction in [name] rea: -130.60% [name] [name] % Reduction in Volume: Full Thickness Without Exposed [name] [name] Classification: Support Structures Medium [name] [name] Exudate Amount: Serosanguineous [name] [name] Exudate Type: red, brown [name] [name] Exudate Color: Flat and Intact [name] [name] Wound Margin: Large (67-100%) [name] [name] Granulation Amount: Red, Hyper-granulation [name] [name] Granulation Quality: None Present (0%) [name] [name] Necrotic Amount: Fat Layer (Subcutaneous Tissue): Yes [name] [name] Exposed Structures: Fascia: No Tendon: No Muscle: No Joint: No Bone: No None [name] [name] Epithelialization: [name] [name] [name] Debridement: [name] [name] [name] Tissue Debrided: [name] [name] [name] Level: [name] [name] [name] Debridement [name] (sq cm): rea [name] [name] [name] Instrument: [name] [name] [name] Bleeding: [name] [name] [name] Hemostasis [name]  chieved: [name] [name] [name] Procedural Pain: [name] [name] [name] Post Procedural Pain: Debridement Treatment Response: [name] [name] [name] Post Debridement Measurements L x [name] [name] [name] W x D (cm) [name] [name] [name] Post Debridement Volume: (cm) Chemical Cauterization [name] [name] Procedures Performed: Treatment Notes Wound #41R (Ischium) Wound Laterality: Left Cleanser Soap and Water Discharge Instruction: May shower and wash wound with dial antibacterial soap and water prior to dressing change. Peri-Wound Care Zinc Oxide Ointment 30g tube Discharge Instruction: Apply Zinc Oxide to periwound with each dressing change Topical Primary Dressing KerraCel Ag Gelling Fiber Dressing, 4x5 in (silver alginate) Discharge Instruction: Apply silver alginate to wound bed as instructed Secondary Dressing ABD Pad, 8x10 Discharge Instruction: Apply over primary dressing as directed. Woven Gauze Sponge, Non-Sterile  4x4 in Discharge Instruction: Apply over primary dressing as directed. Secured With 94M Medipore H Soft Cloth Surgical T 4 x 2 (in/yd) ape Discharge Instruction: Secure dressing with tape as directed. Compression Wrap Compression Stockings Add-Ons Wound #52 (Foot) Wound Laterality: Dorsal, Right Cleanser Soap and Water Discharge Instruction: May shower and wash wound with dial antibacterial soap and water prior to dressing change. Peri-Wound Care Triamcinolone 15 (g) Discharge Instruction: Use triamcinolone 15 (g) as directed Sween Lotion (Moisturizing lotion) Discharge Instruction: Apply Aquaphor moisturizing lotion as directed Lotrisone Discharge Instruction: Apply Lotrisone to periwound Topical Primary Dressing KerraCel Ag Gelling Fiber Dressing, 2x2 in (silver alginate) Discharge Instruction: Apply silver alginate to wound bed as instructed Secondary Dressing Woven Gauze Sponge, Non-Sterile 4x4 in Discharge Instruction: Apply over primary dressing as directed. Secured With [employer],  4.5x3.1 (in/yd) Discharge Instruction: Secure with Kerlix as directed. 94M Medipore H Soft Cloth Surgical T ape, 4 x 10 (in/yd) Discharge Instruction: Secure with tape as directed. Compression Wrap Compression Stockings Add-Ons Wound #56 (Foot) Wound Laterality: Dorsal, Left Cleanser Soap and Water Discharge Instruction: May shower and wash wound with dial antibacterial soap and water prior to dressing change. Peri-Wound Care Triamcinolone 15 (g) Discharge Instruction: Use triamcinolone 15 (g) as directed Sween Lotion (Moisturizing lotion) Discharge Instruction: Apply Aquaphor moisturizing lotion as directed Lotrisone Discharge Instruction: Apply Lotrisone to periwound Topical Primary Dressing KerraCel Ag Gelling Fiber Dressing, 2x2 in (silver alginate) Discharge Instruction: Apply silver alginate to wound bed as instructed Secondary Dressing Woven Gauze Sponge, Non-Sterile 4x4 in Discharge Instruction: Apply over primary dressing as directed. Secured With [employer], 4.5x3.1 (in/yd) Discharge Instruction: Secure with Kerlix as directed. 94M Medipore H Soft Cloth Surgical T ape, 4 x 10 (in/yd) Discharge Instruction: Secure with tape as directed. Compression Wrap Compression Stockings Add-Ons Wound #62 (Ankle) Wound Laterality: Right, Anterior Cleanser Soap and Water Discharge Instruction: May shower and wash wound with dial antibacterial soap and water prior to dressing change. Peri-Wound Care Triamcinolone 15 (g) Discharge Instruction: Use triamcinolone 15 (g) as directed Sween Lotion (Moisturizing lotion) Discharge Instruction: Apply Aquaphor moisturizing lotion as directed Lotrisone Discharge Instruction: Apply Lotrisone to periwound Topical Primary Dressing KerraCel Ag Gelling Fiber Dressing, 2x2 in (silver alginate) Discharge Instruction: Apply silver alginate to wound bed as instructed Secondary Dressing Woven Gauze Sponge, Non-Sterile 4x4 in Discharge  Instruction: Apply over primary dressing as directed. Secured With [employer], 4.5x3.1 (in/yd) Discharge Instruction: Secure with Kerlix as directed. 94M Medipore H Soft Cloth Surgical T ape, 4 x 10 (in/yd) Discharge Instruction: Secure with tape as directed. Compression Wrap Compression Stockings Add-Ons Electronic Signature(s) Signed: 04/13/2022 9:55:08 AM By: Fredirick Maudlin MD FACS Signed: 04/15/2022 6:06:15 PM By: Baruch Gouty RN, BSN Entered By: Fredirick Maudlin on 04/13/2022 09:55:08 -------------------------------------------------------------------------------- Multi-Disciplinary Care Plan Details Patient Name: Date of Service: [name], [name] 04/13/2022 8:15 [name] Medical Record Number: [id number] Patient Account Number: [id number] Date of Birth/Sex: Treating RN: 03/10/1986 (36 y.o. [name] Primary Care Moet Mikulski: [name], [name] Other Clinician: Referring Robbert Langlinais: Treating Neftali Abair/Extender: Cornelia Copa [name]: Pace reviewed with physician Active Inactive Wound/Skin Impairment Nursing Diagnoses: Impaired tissue integrity Knowledge deficit related to ulceration/compromised skin integrity Goals: Patient/caregiver will verbalize understanding of skin care regimen Date Initiated: 01/05/2016 Target Resolution Date: 04/23/2022 Goal Status: Active Ulcer/skin breakdown will have [name] volume reduction of 30% by week 4 Date Initiated: 01/05/2016 Date Inactivated: 12/22/2017 Target Resolution Date: 01/19/2018 Unmet Reason: complex wounds, Goal Status: Unmet  infection Interventions: Assess patient/caregiver ability to obtain necessary supplies Assess ulceration(s) every visit Provide education on ulcer and skin care Notes: 02/02/21: Complex Care, ongoing. Electronic Signature(s) Signed: 04/20/2022 4:31:14 PM By: Sharyn Creamer RN, BSN Entered By: Sharyn Creamer on 04/13/2022  09:12:44 -------------------------------------------------------------------------------- Pain Assessment Details Patient Name: Date of Service: Komperda, [name] 04/13/2022 8:15 [name] Medical Record Number: [id number] Patient Account Number: [id number] Date of Birth/Sex: Treating RN: [birth year] (36 y.o. [name] Primary Care Lynessa Almanzar: [place], [place] Other Clinician: Referring Kelis Plasse: Treating Randale Carvalho/Extender: Cornelia Copa [name]: 327 Active Problems Location of Pain Severity and Description of Pain Patient Has Paino No Site Locations Pain Management and Medication Current Pain Management: Electronic Signature(s) Signed: 04/20/2022 4:31:14 PM By: Sharyn Creamer RN, BSN Entered By: Sharyn Creamer on 04/13/2022 08:51:30 -------------------------------------------------------------------------------- Patient/Caregiver Education Details Patient Name: Date of Service: Kuznicki, [name] Side 5/23/2023andnbsp8:15 [name] Medical Record Number: [id number] Patient Account Number: [id number] Date of Birth/Gender: Treating RN: [birth year] (36 y.o. [name] Primary Care Physician: Janine Limbo Other Clinician: Referring Physician: Treating Physician/Extender: Cornelia Copa [name]: 76 Education Assessment Education Provided To: Patient Education Topics Provided Wound/Skin Impairment: Methods: Explain/Verbal Responses: State content correctly Electronic Signature(s) Signed: 04/20/2022 4:31:14 PM By: Sharyn Creamer RN, BSN Entered By: Sharyn Creamer on 04/13/2022 09:13:18 -------------------------------------------------------------------------------- Wound Assessment Details Patient Name: Date of Service: Koehler, [name] 04/13/2022 8:15 [name] Medical Record Number: [id number] Patient Account Number: [id number] Date of Birth/Sex: Treating RN: [birth year] (36 y.o. [name] Primary Care Nadyne Gariepy: [name],  [name] Other Clinician: Referring Anie Juniel: Treating Shinita Mac/Extender: Graciela Husbands, [name]: 327 Wound Status Wound Number: [id number] Primary Etiology: Pressure Ulcer Wound Location: Left Ischium Wound Status: Open Wounding Event: Gradually Appeared Comorbid History: Sleep Apnea, Hypertension, Paraplegia Date Acquired: 03/16/2020 Weeks Of Treatment: 108 Clustered Wound: Yes Photos Wound Measurements Length: (cm) 3.4 Width: (cm) 2.8 Depth: (cm) 0.1 Clustered Quantity: 3 Area: (cm) 7.477 Volume: (cm) 0.748 % Reduction in Area: -314% % Reduction in Volume: -313.3% Epithelialization: Small (1-33%) Tunneling: No Undermining: No Wound Description Classification: Category/Stage II Wound Margin: Distinct, outline attached Exudate Amount: Medium Exudate Type: Serosanguineous Exudate Color: red, brown Foul Odor After Cleansing: No Slough/Fibrino Yes Wound Bed Granulation Amount: Large (67-100%) Exposed Structure Granulation Quality: Red, Friable Fascia Exposed: No Necrotic Amount: Small (1-33%) Fat Layer (Subcutaneous Tissue) Exposed: Yes Necrotic Quality: Adherent Slough Tendon Exposed: No Muscle Exposed: No Joint Exposed: No Bone Exposed: No Treatment Notes Wound #41R (Ischium) Wound Laterality: Left Cleanser Soap and Water Discharge Instruction: May shower and wash wound with dial antibacterial soap and water prior to dressing change. Peri-Wound Care Zinc Oxide Ointment 30g tube Discharge Instruction: Apply Zinc Oxide to periwound with each dressing change Topical Primary Dressing KerraCel Ag Gelling Fiber Dressing, 4x5 in (silver alginate) Discharge Instruction: Apply silver alginate to wound bed as instructed Secondary Dressing ABD Pad, 8x10 Discharge Instruction: Apply over primary dressing as directed. Woven Gauze Sponge, Non-Sterile 4x4 in Discharge Instruction: Apply over primary dressing as directed. Secured With 37M Medipore H  Soft Cloth Surgical T 4 x 2 (in/yd) ape Discharge Instruction: Secure dressing with tape as directed. Compression Wrap Compression Stockings Add-Ons Electronic Signature(s) Signed: 04/20/2022 4:31:14 PM By: Sharyn Creamer RN, BSN Entered By: Sharyn Creamer on 04/13/2022 09:08:41 -------------------------------------------------------------------------------- Wound Assessment Details Patient Name: Date of Service: Morini, [name] 04/13/2022 8:15 [name] Medical Record Number: [id number] Patient Account Number: [id number] Date of Birth/Sex: Treating RN: October 07, 1986 (  36 y.o. [name] Primary Care Kamilo Och: [name], [name] Other Clinician: Referring Autie Vasudevan: Treating Farron Lafond/Extender: Graciela Husbands, [name]: 327 Wound Status Wound Number: 52 Primary Etiology: Trauma, Other Wound Location: Right, Dorsal Foot Wound Status: Open Wounding Event: Gradually Appeared Comorbid History: Sleep Apnea, Hypertension, Paraplegia Date Acquired: 03/27/2021 Weeks Of Treatment: 54 Clustered Wound: No Photos Wound Measurements Length: (cm) 0.1 Width: (cm) 0.1 Depth: (cm) 0.1 Area: (cm) 0.008 Volume: (cm) 0.001 % Reduction in Area: 99.6% % Reduction in Volume: 99.5% Epithelialization: Large (67-100%) Tunneling: No Undermining: No Wound Description Classification: Full Thickness Without Exposed Support Structu Wound Margin: Flat and Intact Exudate Amount: Medium Exudate Type: Serosanguineous Exudate Color: red, brown res Foul Odor After Cleansing: No Slough/Fibrino Yes Wound Bed Granulation Amount: Large (67-100%) Exposed Structure Granulation Quality: Red, Pink Fascia Exposed: No Necrotic Amount: Small (1-33%) Fat Layer (Subcutaneous Tissue) Exposed: Yes Necrotic Quality: Adherent Slough Tendon Exposed: No Muscle Exposed: No Joint Exposed: No Bone Exposed: No Treatment Notes Wound #52 (Foot) Wound Laterality: Dorsal, Right Cleanser Soap and  Water Discharge Instruction: May shower and wash wound with dial antibacterial soap and water prior to dressing change. Peri-Wound Care Triamcinolone 15 (g) Discharge Instruction: Use triamcinolone 15 (g) as directed Sween Lotion (Moisturizing lotion) Discharge Instruction: Apply Aquaphor moisturizing lotion as directed Lotrisone Discharge Instruction: Apply Lotrisone to periwound Topical Primary Dressing KerraCel Ag Gelling Fiber Dressing, 2x2 in (silver alginate) Discharge Instruction: Apply silver alginate to wound bed as instructed Secondary Dressing Woven Gauze Sponge, Non-Sterile 4x4 in Discharge Instruction: Apply over primary dressing as directed. Secured With [employer], 4.5x3.1 (in/yd) Discharge Instruction: Secure with Kerlix as directed. 7M Medipore H Soft Cloth Surgical T ape, 4 x 10 (in/yd) Discharge Instruction: Secure with tape as directed. Compression Wrap Compression Stockings Add-Ons Electronic Signature(s) Signed: 04/20/2022 4:31:14 PM By: Sharyn Creamer RN, BSN Entered By: Sharyn Creamer on 04/13/2022 09:05:11 -------------------------------------------------------------------------------- Wound Assessment Details Patient Name: Date of Service: Mates, [name] 04/13/2022 8:15 [name] Medical Record Number: [id number] Patient Account Number: [id number] Date of Birth/Sex: Treating RN: 11-Dec-1985 (35 y.o. [name] Primary Care Kristia Jupiter: [name], [name] Other Clinician: Referring Maveryk Renstrom: Treating Irisha Grandmaison/Extender: Graciela Husbands, [name]: 327 Wound Status Wound Number: 56 Primary Etiology: Neuropathic Ulcer-Non Diabetic Wound Location: Left, Dorsal Foot Wound Status: Open Wounding Event: Gradually Appeared Comorbid History: Sleep Apnea, Hypertension, Paraplegia Date Acquired: 07/11/2021 Weeks Of Treatment: 38 Clustered Wound: Yes Photos Wound Measurements Length: (cm) 2 Width: (cm) 0.5 Depth: (cm) 0.1 Area:  (cm) 0.785 Volume: (cm) 0.079 % Reduction in Area: 87.4% % Reduction in Volume: 87.3% Epithelialization: Large (67-100%) Tunneling: No Undermining: No Wound Description Classification: Full Thickness Without Exposed Support Structures Wound Margin: Flat and Intact Exudate Amount: Medium Exudate Type: Serosanguineous Exudate Color: red, brown Foul Odor After Cleansing: No Slough/Fibrino Yes Wound Bed Granulation Amount: Small (1-33%) Exposed Structure Granulation Quality: Red Fascia Exposed: No Necrotic Amount: Large (67-100%) Fat Layer (Subcutaneous Tissue) Exposed: Yes Necrotic Quality: Adherent Slough Tendon Exposed: No Muscle Exposed: No Joint Exposed: No Bone Exposed: No Treatment Notes Wound #56 (Foot) Wound Laterality: Dorsal, Left Cleanser Soap and Water Discharge Instruction: May shower and wash wound with dial antibacterial soap and water prior to dressing change. Peri-Wound Care Triamcinolone 15 (g) Discharge Instruction: Use triamcinolone 15 (g) as directed Sween Lotion (Moisturizing lotion) Discharge Instruction: Apply Aquaphor moisturizing lotion as directed Lotrisone Discharge Instruction: Apply Lotrisone to periwound Topical Primary Dressing KerraCel Ag Gelling Fiber Dressing, 2x2 in (silver alginate)  Discharge Instruction: Apply silver alginate to wound bed as instructed Secondary Dressing Woven Gauze Sponge, Non-Sterile 4x4 in Discharge Instruction: Apply over primary dressing as directed. Secured With [employer], 4.5x3.1 (in/yd) Discharge Instruction: Secure with Kerlix as directed. 10M Medipore H Soft Cloth Surgical T ape, 4 x 10 (in/yd) Discharge Instruction: Secure with tape as directed. Compression Wrap Compression Stockings Add-Ons Electronic Signature(s) Signed: 04/20/2022 4:31:14 PM By: Sharyn Creamer RN, BSN Entered By: Sharyn Creamer on 04/13/2022  09:04:10 -------------------------------------------------------------------------------- Wound Assessment Details Patient Name: Date of Service: Orlich, [name] 04/13/2022 8:15 [name] Medical Record Number: [id number] Patient Account Number: [id number] Date of Birth/Sex: Treating RN: 08-01-1986 (36 y.o. [name] Primary Care Epimenio Schetter: [name], [name] Other Clinician: Referring Ricke Kimoto: Treating Cagney Steenson/Extender: Graciela Husbands, [name]: 327 Wound Status Wound Number: 62 Primary Etiology: Venous Leg Ulcer Wound Location: Right, Anterior Ankle Wound Status: Open Wounding Event: Gradually Appeared Comorbid History: Sleep Apnea, Hypertension, Paraplegia Date Acquired: 03/30/2022 Weeks Of Treatment: 2 Clustered Wound: No Photos Wound Measurements Length: (cm) 1.2 Width: (cm) 1.2 Depth: (cm) 0.1 Area: (cm) 1.131 Volume: (cm) 0.113 % Reduction in Area: -128.5% % Reduction in Volume: -130.6% Epithelialization: None Tunneling: No Undermining: No Wound Description Classification: Full Thickness Without Exposed Support Structures Wound Margin: Flat and Intact Exudate Amount: Medium Exudate Type: Serosanguineous Exudate Color: red, brown Foul Odor After Cleansing: No Slough/Fibrino Yes Wound Bed Granulation Amount: Large (67-100%) Exposed Structure Granulation Quality: Red, Hyper-granulation Fascia Exposed: No Necrotic Amount: None Present (0%) Fat Layer (Subcutaneous Tissue) Exposed: Yes Tendon Exposed: No Muscle Exposed: No Joint Exposed: No Bone Exposed: No Treatment Notes Wound #[id number] (Ankle) Wound Laterality: Right, Anterior Cleanser Soap and Water Discharge Instruction: May shower and wash wound with dial antibacterial soap and water prior to dressing change. Peri-Wound Care Triamcinolone 15 (g) Discharge Instruction: Use triamcinolone 15 (g) as directed Sween Lotion (Moisturizing lotion) Discharge Instruction: Apply Aquaphor  moisturizing lotion as directed Lotrisone Discharge Instruction: Apply Lotrisone to periwound Topical Primary Dressing KerraCel Ag Gelling Fiber Dressing, 2x2 in (silver alginate) Discharge Instruction: Apply silver alginate to wound bed as instructed Secondary Dressing Woven Gauze Sponge, Non-Sterile 4x4 in Discharge Instruction: Apply over primary dressing as directed. Secured With [employer], 4.5x3.1 (in/yd) Discharge Instruction: Secure with Kerlix as directed. 10M Medipore H Soft Cloth Surgical T ape, 4 x 10 (in/yd) Discharge Instruction: Secure with tape as directed. Compression Wrap Compression Stockings Add-Ons Electronic Signature(s) Signed: 04/20/2022 4:31:14 PM By: Sharyn Creamer RN, BSN Entered By: Sharyn Creamer on 04/13/2022 09:02:29 -------------------------------------------------------------------------------- [place] Details Patient Name: Date of Service: Corwin, [name] 04/13/2022 8:15 [name] Medical Record Number: [id number] Patient Account Number: [id number] Date of Birth/Sex: Treating RN: 1986-04-21 (36 y.o. [name] Primary Care Aniesha Haughn: [place], [place] Other Clinician: Referring Ariyana Faw: Treating Christabell Loseke/Extender: Graciela Husbands, [name]: 327 Vital Signs Time Taken: 08:48 Temperature (F): 98.4 Height (in): 70 Pulse (bpm): 98 Weight (lbs): 216 Respiratory Rate (breaths/min): 18 Body Mass Index (BMI): 31 Blood Pressure (mmHg): 130/83 Reference Range: 80 - 120 mg / dl Electronic Signature(s) Signed: 04/20/2022 4:31:14 PM By: Sharyn Creamer RN, BSN Entered By: Sharyn Creamer on 04/13/2022 08:51:23

## 2022-04-20 NOTE — Progress Notes (Signed)
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 but very gradual and very dry surface using Hydrofera Blue 06/16/17 requires weekly debridements now on the left lateral leg although this continues to contract. I changed to silver collagen last week because of dryness of the wound bed. Using Iodoflex to the areas on his first and second toes/web space bilaterally 06/24/17; patient with history of paraplegia also chronic venous insufficiency with lymphedema. Has a very difficult wound on the left lateral leg. This has been gradually reducing in terms of with but comes in with a very dry adherent surface. High switch to silver collagen a week or so ago with hydrogel to keep the area moist. This is been refractory to multiple dressing attempts. He also has areas in his first and second toes bilaterally in the anterior and posterior web space. I had been using Iodoflex here after a prolonged course of silver alginate with ketoconazole was ineffective [question tinea pedis] 07/14/17; patient arrives today with a very difficult adherent material over his left lateral lower leg  wound. He also has surrounding erythema and poorly controlled edema. He was switched his Santyl last visit which the  nurses are applying once during his doctor visit and once on a nurse visit. He was also reduced to 2 layer compression I'm not exactly sure of the issue here. 07/21/17; better surface today after 1 week of Iodoflex. Significant cellulitis that we treated last week also better. [Doxycycline] 07/28/17 better surface today with now 2 weeks of Iodoflex. Significant cellulitis treated with doxycycline. He has now completed the doxycycline and he is back to his usual degree of chronic venous inflammation/stasis dermatitis. He reminds me he has had ablations surgery here 08/04/17; continued improvement with Iodoflex to the left lateral leg wound in terms of the surface of the wound although the dimensions are better. 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 Problematic areas on the plantar aspect of the first second toe web space on the left and the dorsal aspect of the first second toe web space on the right. At one point I felt these were probably related to chronic fungal infections in treated him aggressively for this although we have not made any improvement here. 08/11/17; left lateral leg. Surface continues to improve with the Iodoflex although we are not seeing much improvement in overall wound dimensions. Areas on his plantar left foot and right foot show no improvement. In fact the right foot looks somewhat worse 08/18/17; left lateral leg. We changed to [facility] Blue last week after a prolonged course of Iodoflex which helps get the surface better. It appears that the wound with is improved. Continue with difficult areas on the left dorsal first second and plantar first second on the right 09/01/17; patient arrives in clinic today having had a temperature of 103 yesterday. He was seen in the ER and [facility]. The patient was concerned he could have cellulitis again in the right leg however they diagnosed him with a UTI and he is now  on Keflex. He has a history of cellulitis which is been recurrent and difficult but this is been in the left leg, in the past 5 use doxycycline. He does in and out catheterizations at home which are risk factors for UTI 09/08/17; patient will be completing his Keflex this weekend. The erythema on the left leg is considerably better. He has a new wound today on the medial part of the right leg small superficial almost looks like a skin tear. He has worsening of the area on the right dorsal first and second toe. His major area on the left lateral leg is better. Using Hydrofera Blue on all areas 09/15/17; gradual reduction in width on the long wound in the left lateral leg. No debridement required. He also has wounds on the plantar aspect of his left first second toe web space and on the dorsal aspect of the right first second toe web space. 09/22/17; there continues to be very gradual improvements in the dimensions of the left lateral leg wound. He hasn't round erythematous spot with might be pressure on his wheelchair. There is no evidence obviously of infection no purulence no warmth He has a dry scaled area on the plantar aspect of the left first second toe Improved area on the dorsal right first second toe. 09/29/17; left lateral leg wound continues to improve in dimensions mostly with an is still a fairly long but increasingly narrow wound. He has a dry scaled area on the plantar  aspect of his left first second toe web space Increasingly concerning area on the dorsal right first second toe. In fact I am concerned today about possible cellulitis around this wound. The areas extending up his second toe and although there is deformities here almost appears to abut on the nailbed. 10/06/17; left lateral leg wound continues to make very gradual progress. Tissue culture I did from the right first second toe dorsal foot last time grew MRSA and enterococcus which was vancomycin sensitive. This was not  sensitive to clindamycin or doxycycline. He is allergic to Zyvox and sulfa we have therefore arrange for him to have dalvance infusion tomorrow. He is had this in the past and tolerated it well 10/20/17; left lateral leg wound continues to make decent progress. This is certainly reduced in terms of with there is advancing epithelialization.The cellulitis in the right foot looks better although he still has a deep wound in the dorsal aspect of the first second toe web space. Plantar left first toe web space on the left I think is making some progress 10/27/17; left lateral leg wound continues to make decent progress. Advancing epithelialization.using Hydrofera Blue The right first second toe web space wound is better-looking using silver alginate Improvement in the left plantar first second toe web space. Again using silver alginate 11/03/17 left lateral leg wound continues to make decent progress albeit slowly. Using [facility] The right per second toe web space continues to be a very problematic looking punched out wound. I obtained a piece of tissue for deep culture I did extensively treated this for fungus. It is difficult to imagine that this is a pressure area as the patient states other than going outside he doesn't really wear shoes at home The left plantar first second toe web space looked fairly senescent. Necrotic edges. This required debridement change to [facility] Blue to all wound areas 11/10/17; left lateral leg wound continues to contract. Using Hydrofera Blue On the right dorsal first second toe web space dorsally. Culture I did of this area last week grew MRSA there is not an easy oral option in this patient was multiple antibiotic allergies or intolerances. This was only a rare culture isolate I'm therefore going to use Bactroban under silver alginate On the left plantar first second toe web space. Debridement is required here. This is also unchanged 11/17/17; left lateral leg  wound continues to contract using Hydrofera Blue this is no longer the major issue. The major concern here is the right first second toe web space. He now has an open area going from dorsally to the plantar aspect. There is now wound on the inner lateral part of the first toe. Not a very viable surface on this. There is erythema spreading medially into the forefoot. No major change in the left first second toe plantar wound 11/24/17; left lateral leg wound continues to contract using Hydrofera Blue. Nice improvement today The right first second toe web space all of this looks a lot less angry than last week. I have given him clindamycin and topical Bactroban for MRSA and terbinafine for the possibility of underlining tinea pedis that I could not control with ketoconazole. Looks somewhat better The area on the plantar left first second toe web space is weeping with dried debris around the wound 12/01/17; left lateral leg wound continues to contract he Hydrofera Blue. It is becoming thinner in terms of with nevertheless it is making good improvement. The right first second toe web space looks less  angry but still a large necrotic-looking wounds starting on the plantar aspect of the right foot extending between the toes and now extensively on the base of the right second toe. I gave him clindamycin and topical Bactroban for MRSA anterior benefiting for the possibility of underlying tinea pedis. Not looking better today The area on the left first/second toe looks better. Debrided of necrotic debris 12/05/17* the patient was worked in urgently today because over the weekend he found blood on his incontinence bad when he woke up. He was found to have an ulcer by his wife who does most of his wound care. He came in today for [place] to look at this. He has not had a history of wounds in his buttocks in spite of his paraplegia. 12/08/17; seen in follow-up today at his usual appointment. He was seen earlier this week  and found to have a new wound on his buttock. We also follow him for wounds on the left lateral leg, left first second toe web space and right first second toe web space 12/15/17; we have been using Hydrofera Blue to the left lateral leg which has improved. The right first second toe web space has also improved. Left first second toe web space plantar aspect looks stable. The left buttock has worsened using Santyl. Apparently the buttock has drainage 12/22/17; we have been using Hydrofera Blue to the left lateral leg which continues to improve now 2 small wounds separated by normal skin. He tells [place] he had a fever up to 100 yesterday he is prone to UTIs but has not noted anything different. He does in and out catheterizations. The area between the first and second toes today does not look good necrotic surface covered with what looks to be purulent drainage and erythema extending into the third toe. I had gotten this to something that I thought look better last time however it is not look good today. He also has a necrotic surface over the buttock wound which is expanded. I thought there might be infection under here so I removed a lot of the surface with a #5 curet though nothing look like it really needed culturing. He is been using Santyl to this area 12/27/17; his original wound on the left lateral leg continues to improve using Hydrofera Blue. I gave him samples of Baxdella although he was unable to take them out of fear for an allergic reaction ["lump in his throat"].the culture I did of the purulent drainage from his second toe last week showed both enterococcus and a set Enterobacter I was also concerned about the erythema on the bottom of his foot although paradoxically although this looks somewhat better today. 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 We've been using silver alginate here. They came up  today that he sleeps in a recliner, I'm not sure why but I asked him to stop this 01/03/18; his original wound we've been using Hydrofera Blue is now separated into 2 areas. Ulcer on his left buttock is better he is off the recliner and sleeping in bed Finally both wound areas between his first and second toes also looks some better 01/10/18; his original wound on the left lateral leg is now separated into 2 wounds we've been using Hydrofera Blue Ulcer on his left buttock has some drainage. There is a small probing site going into muscle layer superiorly.using silver alginate -He arrives today with  a deep tissue injury on the left heel The wound on the dorsal aspect of his first second toe on the left looks a lot betterusing silver alginate ketoconazole The area on the first second toe web space on the right also looks a lot bette 01/17/18; his original wound on the left lateral leg continues to progress using Hydrofera Blue Ulcer on his left buttock also is smaller surface healthier except for a small probing site going into the muscle layer superiorly. 2.4 cm of tunneling in this area DTI on his left heel we have only been offloading. Looks better than last week no threatened open no evidence of infection the wound on the dorsal aspect of the first second toe on the left continues to look like it's regressing we have only been using silver alginate and terbinafine orally The area in the first second toe web space on the right also looks to be a lot better using silver alginate and terbinafine I think this was prompted by tinea pedis 01/31/18; the patient was hospitalized in [place] last week apparently for a complicated UTI. He was discharged on cefepime he does in and out catheterizations. In the hospital he was discovered M I don't mild elevation of AST and ALT and the terbinafine was stopped.predictably the pressure ulcer on s his buttock looks betterusing silver alginate. The area on the left  lateral leg also is better using Hydrofera Blue. The area between the first and second toes on the left better. First and second toes on the right still substantial but better. Finally the DTI on the left heel has held together and looks like it's resolving 02/07/18-he is here in follow-up evaluation for multiple ulcerations. He has new injury to the lateral aspect of the last issue a pressure ulcer, he states this is from adhesive removal trauma. He states he has tried multiple adhesive products with no success. All other ulcers appear stable. The left heel DTI is resolving. We will continue with same treatment plan and follow-up next week. 02/14/18; follow-up for multiple areas. He has a new area last week on the lateral aspect of his pressure ulcer more over the posterior trochanter. The original pressure ulcer looks quite stable has healthy granulation. 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 Almost fully epithelialized on the original superior area using Hydrofera Blue DTI on the left heel has peeled off this week to reveal a small superficial wound under denuded skin and subcutaneous tissue Both areas between the first and second toes look better including nothing open on the left 02/21/18; The patient's wounds on his left ischial tuberosity and posterior left greater trochanter actually looked better. He has a large area of irritation around the area which I think is contact dermatitis. I am doubtful that this is fungal His original wound on the left lateral calf continues to improve we have been using Hydrofera Blue There is no open area in the left first second toe web space although there is a lot of thick callus The DTI on the left heel required debridement today of necrotic surface eschar and subcutaneous tissue using silver alginate Finally the area on the right first second toe  webspace continues to contract using silver alginate and ketoconazole 02/28/18 Left ischial tuberosity wounds look better using silver alginate. Original wound on the left calf only has one small open area left using Hydrofera Blue DTI on the left  heel required debridement mostly removing skin from around this wound surface. Using silver alginate The areas on the right first/second toe web space using silver alginate and ketoconazole 03/08/18 on evaluation today patient appears to be doing decently well as best I can tell in regard to his wounds. This is the first time that I have seen him as he generally is followed by Dr. Dellia Nims. With that being said none of his wounds appear to be infected he does have an area where there is some skin covering what appears to be a new wound on the left dorsal surface of his great toe. This is right at the nail bed. With that being said I do believe that debrided away some of the excess skin can be of benefit in this regard. Otherwise he has been tolerating the dressing changes without complication. 03/14/18; patient arrives today with the multiplicity of wounds that we are following. He has not been systemically unwell Original wound on the left lateral calf now only has 2 small open areas we've been using Hydrofera Blue which should continue The deep tissue injury on the left heel requires debridement today. We've been using silver alginate The left first second toe and the right first second toe are both are reminiscence what I think was tinea pedis. Apparently some of the callus Surface between the toes was removed last week when it started draining. Purulent drainage coming from the wound on the ischial tuberosity on the left. 03/21/18-He is here in follow-up evaluation for multiple wounds. 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 He tolerated debridement. The only change to last week's  recommendations is to discontinue antifungal cream between toes. He will follow-up next week 03/28/18; following up for multiple wounds;Concern this week is streaking redness and swelling in the right foot. He is going to need antibiotics for this. 03/31/18; follow-up for right foot cellulitis. Streaking redness and swelling in the right foot on 03/28/18. 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 I put him on clindamycin 300 mg every 6 and brought him in for a quick check. He has an open wound between his first and second toes on the right foot as a potential source. 04/04/18; Right foot cellulitis is resolving he is completing clindamycin. This is truly good news Left lateral calf wound which is initial wound only has one small open area inferiorly this is close to healing out. He has compression stockings. We will use Hydrofera Blue right down to the epithelialization of this Nonviable surface on the left heel which was initially pressure with a DTI. We've been using Hydrofera Blue. I'm going to switch this back to silver alginate Left first second toe/tinea pedis this looks better using silver alginate Right first second toe tinea pedis using silver alginate Large pressure ulcers on theLeft ischial tuberosity. Small wound here Looks better. I am uncertain about the surface over the large wound. Using silver alginate 04/11/18; Cellulitis in the right foot is resolved Left lateral calf wound which was his original wounds still has 2 tiny open areas remaining this is just about closed Nonviable surface on the left heel is better but still requires debridement Left first second toe/tinea pedis still open using silver alginate Right first second toe wound tinea pedis I asked him to go back to using ketoconazole and silver alginate Large pressure ulcers on the left ischial tuberosity this shear injury here is resolved. Wound is smaller. No  evidence of infection using silver  alginate 04/18/18; Patient arrives with an intense area of cellulitis in the right mid lower calf extending into the right heel area. Bright red and warm. Smaller area on the left anterior leg. He has a significant history of MRSA. He will definitely need antibioticsdoxycycline He now has 2 open areas on the left ischial tuberosity the original large wound and now a satellite area which I think was above his initial satellite areas. Not a wonderful surface on this satellite area surrounding erythema which looks like pressure related. His left lateral calf wound again his original wound is just about closed Left heel pressure injury still requiring debridement Left first second toe looks a lot better using silver alginate Right first second toe also using silver alginate and ketoconazole cream also looks better 04/20/18; the patient was worked in early today out of concerns with his cellulitis on the right leg. I had started him on doxycycline. This was 2 days ago. His wife was concerned about the swelling in the area. Also concerned about the left buttock. He has not been systemically unwell no fever chills. No nausea vomiting or diarrhea 04/25/18; the patient's left buttock wound is continued to deteriorate he is using Hydrofera Blue. He is still completing clindamycin for the cellulitis on the right leg although all of this looks better. 05/02/18 Left buttock wound still with a lot of drainage and a very tightly adherent fibrinous necrotic surface. He has a deeper area superiorly The left lateral calf wound is still closed DTI wound on the left heel necrotic surface especially the circumference using Iodoflex Areas between his left first second toe and right first second toe both look better. Dorsally and the right first second toe he had a necrotic surface although at smaller. In using silver alginate and ketoconazole. I did a culture last week which was a deep tissue culture of the reminiscence  of the open wound on the right first second toe dorsally. This grew a few Acinetobacter and a few methicillin-resistant staph aureus. Nevertheless the area actually this week looked better. I didn't feel the need to specifically address this at least in terms of systemic antibiotics. 05/09/18; wounds are measuring larger more drainage per our intake. We are using Santyl covered with alginate on the large superficial buttock wounds, Iodosorb on the left heel, ketoconazole and silver alginate to the dorsal first and second toes bilaterally. 05/16/18; The area on his left buttock better in some aspects although the area superiorly over the ischial tuberosity required an extensive debridement.using Santyl Left heel appears stable. Using Iodoflex The areas between his first and second toes are not bad however there is spreading erythema up the dorsal aspect of his left foot this looks like cellulitis again. He is insensate the erythema is really very brilliant.o Erysipelas He went to see an allergist days ago because he was itching part of this he had lab work done. This showed a white count of 15.1 with 70% neutrophils. Hemoglobin of 11.4 and a platelet count of 659,000. Last white count we had in Epic was a 2-1/2 years ago which was 25.9 but he was ill at the time. He was able to show me some lab work that was done by his primary physician the pattern is about the same. 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 But prompted me to go ahead and do x-rays of both feet and the pelvis rule out  osteomyelitis. He also had a comprehensive metabolic panel this was reasonably normal his albumin was 3.7 liver function tests BUN/creatinine all normal 05/23/18; x-rays of both his feet from last week were negative for underlying pulmonary abnormality. The x-ray of his pelvis however showed mild irregularity in the left ischial which may represent some early osteomyelitis. The  wound in the left ischial continues to get deeper clearly now exposed muscle. Each week necrotic surface material over this area. Whereas the rest of the wounds do not look so bad. The left ischial wound we have been using Santyl and calcium alginate T the left heel surface necrotic debris using Iodoflex o The left lateral leg is still healed Areas on the left dorsal foot and the right dorsal foot are about the same. There is some inflammation on the left which might represent contact dermatitis, fungal dermatitis I am doubtful cellulitis although this looks better than last week 05/30/18; CT scan done at Hospital did not show any osteomyelitis or abscess. Suggested the possibility of underlying cellulitis although I don't see a lot of evidence of this at the bedside The wound itself on the left buttock/upper thigh actually looks somewhat better. No debridement Left heel also looks better no debridement continue Iodoflex Both dorsal first second toe spaces appear better using Lotrisone. Left still required debridement 06/06/18; Intake reported some purulent looking drainage from the left gluteal wound. Using Santyl and calcium alginate Left heel looks better although still a nonviable surface requiring debridement The left dorsal foot first/second webspace actually expanding and somewhat deeper. I may consider doing a shave biopsy of this area Right dorsal foot first/second webspace appears stable to improved. Using Lotrisone and silver alginate to both these areas 06/13/18 Left gluteal surface looks better. Now separated in the 2 wounds. No debridement required. Still drainage. We'll continue silver alginate Left heel continues to look better with Iodoflex continue this for at least another week Of his dorsal foot wounds the area on the left still has some depth although it looks better than last week. We've been using Lotrisone and silver alginate 06/20/18 Left gluteal continues to look better  healthy tissue Left heel continues to look better healthy granulation wound is smaller. He is using Iodoflex and his long as this continues continue the Iodoflex Dorsal right foot looks better unfortunately dorsal left foot does not. There is swelling and erythema of his forefoot. He had minor trauma to this several days ago but doesn't think this was enough to have caused any tissue injury. Foot looks like cellulitis, we have had this problem before 06/27/18 on evaluation today patient appears to be doing a little worse in regard to his foot ulcer. 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 Both of which would really be his only options for treating this infection. In the past he has been given and effusion of Orbactiv. This is done very well for him in the past again it's one time dosing IV antibiotic therapy. Subsequently I do believe this is something we're gonna need to see about doing at this point in time. Currently his other wounds seem to be doing somewhat better in my pinion I'm pretty happy in that regard. 07/03/18 on evaluation today patient's wounds actually appear to be doing fairly well. He has been tolerating the dressing changes without complication. All in all he seems to  be showing signs of improvement. In regard to the antibiotics he has been dealing with infectious disease since I saw him last week as far as getting this scheduled. In the end [facility]'s [facility] this coming Friday. In the meantime he has been continuing to perform the dressing changes in such as previous. There does not appear to be any evidence of infection worsengin at this time. 07/10/18; Since I last saw this man 2 weeks ago things have actually improved. IV antibiotics of resulted in less forefoot erythema although there  is still some present. He is not systemically unwell Left buttock wounds 2 now have no depth there is increased epithelialization Using silver alginate Left heel still requires debridement using Iodoflex Left dorsal foot still with a sizable wound about the size of a border but healthy granulation Right dorsal foot still with a slitlike area using silver alginate 07/18/18; the patient's cellulitis in the left foot is improved in fact I think it is on its way to resolving. Left buttock wounds 2 both look better although the larger one has hypertension granulation we've been using silver alginate Left heel has some thick circumferential redundant skin over the wound edge which will need to be removed today we've been using Iodoflex Left dorsal foot is still a sizable wound required debridement using silver alginate The right dorsal foot is just about closed only a small open area remains here 07/25/18; left foot cellulitis is resolved Left buttock wounds 2 both look better. Hyper-granulation on the major area Left heel as some debris over the surface but otherwise looks a healthier wound. Using silver collagen Right dorsal foot is just about closed 07/31/18; arrives with our intake nurse worried about purulent drainage from the buttock. We had hyper-granulation here last week His buttock wounds 2 continue to look better Left heel some debris over the surface but measuring smaller. Right dorsal foot unfortunately has openings between the toes Left foot superficial wound looks less aggravated. 08/07/18 Buttock wounds continue to look better although some of her granulation and the larger medial wound. silver alginate Left heel continues to look a lot better.silver collagen Left foot superficial wound looks less stable. Requires debridement. He has a new wound superficial area on the foot on the lateral dorsal foot. Right foot looks better using silver alginate without Lotrisone 08/14/2018; patient was  in the ER last week diagnosed with a UTI. He is now on Cefpodoxime and Macrodantin. Buttock wounds continued to be smaller. Using silver alginate Left heel continues to look better using silver collagen Left foot superficial wound looks as though it is improving Right dorsal foot area is just about healed. 08/21/2018; patient is completed his antibiotics for his UTI. He has 2 open areas on the buttocks. There is still not closed although the surface looks satisfactory. Using silver alginate Left heel continues to improve using silver collagen The bilateral dorsal foot areas which are at the base of his first and second toes/possible tinea pedis are actually stable on the left but worse on the right. The area on the left required debridement of necrotic surface. After debridement I obtained a specimen for PCR culture. The right dorsal foot which is been just about healed last week is now reopened 08/28/2018; culture done on the left dorsal foot showed coag negative staph both staph epidermidis and Lugdunensis. I think this is worthwhile initiating systemic treatment. I will use doxycycline given his long  list of allergies. The area on the left heel slightly improved but still requiring debridement. The large wound on the buttock is just about closed whereas the smaller one is larger. Using silver alginate in this area 09/04/2018; patient is completing his doxycycline for the left foot although this continues to be a very difficult wound area with very adherent necrotic debris. We are using silver alginate to all his wounds right foot left foot and the small wounds on his buttock, silver collagen on the left heel. 09/11/2018; once again this patient has intense erythema and swelling of the left forefoot. Lesser degrees of erythema in the right foot. He has a long list of allergies and intolerances. I will reinstitute doxycycline. 2 small areas on the left buttock are all the left of his major stage  III pressure ulcer. Using silver alginate Left heel also looks better using silver collagen Unfortunately both the areas on his feet look worse. The area on the left first second webspace is now gone through to the plantar part of his foot. The area on the left foot anteriorly is irritated with erythema and swelling in the forefoot. 09/25/2018 His wound on the left plantar heel looks better. Using silver collagen The area on the left buttock 2 small remnant areas. One is closed one is still open. Using silver alginate The areas between both his first and second toes look worse. This in spite of long-standing antifungal therapy with ketoconazole and silver alginate which should have antifungal activity He has small areas around his original wound on the left calf one is on the bottom of the original scar tissue and one superiorly both of these are small and superficial but again given wound history in this site this is worrisome 10/02/2018 Left plantar heel continues to gradually contract using silver collagen Left buttock wound is unchanged using silver alginate The areas on his dorsal feet between his first and second toes bilaterally look about the same. I prescribed clindamycin ointment to see if we can address chronic staph colonization and also the underlying possibility of erythrasma The left lateral lower extremity wound is actually on the lateral part of his ankle. Small open area here. We have been using silver alginate 10/09/2018; Left plantar heel continues to look healthy and contract. No debridement is required Left buttock slightly smaller with a tape injury wound just below which was new this week Dorsal feet somewhat improved I have been using clindamycin Left lateral looks lower extremity the actual open area looks worse although a lot of this is epithelialized. I am going to change to silver collagen today He has a lot more swelling in the right leg although this is not pitting  not red and not particularly warm there is a lot of spasm in the right leg usually indicative of people with paralysis of some underlying discomfort. We have reviewed his vascular status from 2017 he had a left greater saphenous vein ablation. I wonder about referring him back to vascular surgery if the area on the left leg continues to deteriorate. 10/16/2018 in today for follow-up and management of multiple lower extremity ulcers. His left Buttock wound is much lower smaller and almost closed completely. The wound to the left ankle has began to reopen with Epithelialization and some adherent slough. He has multiple new areas to the left foot and leg. The left dorsal foot without much improvement. Wound present between left great webspace and 2nd toe. Erythema and edema present right leg. Right LE  ultrasound obtained on 10/10/18 was negative for DVT . 10/23/2018; Left buttock is closed over. Still dry macerated skin but there is no open wound. I suspect this is chronic pressure/moisture Left lateral calf is quite a bit worse than when I saw this last. There is clearly drainage here he has macerated skin into the left plantar heel. We will change the primary dressing to alginate Left dorsal foot has some improvement in overall wound area. Still using clindamycin and silver alginate Right dorsal foot about the same as the left using clindamycin and silver alginate The erythema in the right leg has resolved. He is DVT rule out was negative Left heel pressure area required debridement although the wound is smaller and the surface is health 10/26/2018 The patient came back in for his nurse check today predominantly because of the drainage coming out of the left lateral leg with a recent reopening of his original wound on the left lateral calf. He comes in today with a large amount of surrounding erythema around the wound extending from the calf into the ankle and even in the area on the dorsal foot. He  is not systemically unwell. He is not febrile. Nevertheless this looks like cellulitis. We have been using silver alginate to the area. I changed him to a regular visit and I am going to prescribe him doxycycline. The rationale here is a long list of medication intolerances and a history of MRSA. I did not see anything that I thought would provide a valuable culture 10/30/2018 Follow-up from his appointment 4 days ago with really an extensive area of cellulitis in the left calf left lateral ankle and left dorsal foot. I put him on doxycycline. He has a long list of medication allergies which are true allergy reactions. 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 In any case he is a lot better today. The erythema especially in the anterior and lateral left calf is better. He still has left ankle erythema. He also is complaining about increasing edema in the right leg we have only been using Kerlix Coban and he has been doing the wraps at home. Finally he has a spotty rash on the medial part of his upper left calf which looks like folliculitis or perhaps wrap occlusion type injury. Small superficial macules not pustules 11/06/18 patient arrives today with again a considerable degree of erythema around the wound on the left lateral calf extending into the dorsal ankle and dorsal foot. This is a lot worse than when I saw this last week. He is on doxycycline really with not a lot of improvement. He has not been systemically unwell Wounds on the; left heel actually looks improved. Original area on the left foot and proximity to the first and second toes looks about the same. He has superficial areas on the dorsal foot, anterior calf and then the reopening of his original wound on the left lateral calf which looks about the same The only area he has on the right is the dorsal webspace first and second which is smaller. He has a large area of  dry erythematous skin on the left buttock small open area here. 11/13/2018; the patient arrives in much better condition. The erythema around the wound on the left lateral calf is a lot better. Not sure whether this was the clindamycin or the TCA and ketoconazole or just in the improvement in edema control [stasis dermatitis]. In any case  this is a lot better. The area on the left heel is very small and just about resolved using silver collagen we have been using silver alginate to the areas on his dorsal feet 11/20/2018; his wounds include the left lateral calf, left heel, dorsal aspects of both feet just proximal to the first second webspace. He is stable to slightly improved. I did not think any changes to his dressings were going to be necessary 11/27/2018 he has a reopening on the left buttock which is surrounded by what looks like tinea or perhaps some other form of dermatitis. The area on the left dorsal foot has some erythema around it I have marked this area but I am not sure whether this is cellulitis or not. Left heel is not closed. Left calf the reopening is really slightly longer and probably worse 1/13; in general things look better and smaller except for the left dorsal foot. Area on the left heel is just about closed, left buttock looks better only a small wound remains in the skin looks better [using Lotrisone] 1/20; the area on the left heel only has a few remaining open areas here. Left lateral calf about the same in terms of size, left dorsal foot slightly larger right lateral foot still not closed. The area on the left buttock has no open wound and the surrounding skin looks a lot better 1/27; the area on the left heel is closed. Left lateral calf better but still requiring extensive debridements. The area on his left buttock is closed. He still has the open areas on the left dorsal foot which is slightly smaller in the right foot which is slightly expanded. We have been using  Iodoflex on these areas as well 2/3; left heel is closed. Left lateral calf still requiring debridement using Iodoflex there is no open area on his left buttock however he has dry scaly skin over a large area of this. Not really responding well to the Lotrisone. Finally the areas on his dorsal feet at the level of the first second webspace are slightly smaller on the right and about the same on the left. Both of these vigorously debrided with Anasept and gauze 2/10; left heel remains closed he has dry erythematous skin over the left buttock but there is no open wound here. Left lateral leg has come in and with. Still requiring debridement we have been using Iodoflex here. Finally the area on the left dorsal foot and right dorsal foot are really about the same extremely dry callused fissured areas. He does not yet have a dermatology appointment 2/17; left heel remains closed. He has a new open area on the left buttock. The area on the left lateral calf is bigger longer and still covered in necrotic debris. No major change in his foot areas bilaterally. I am awaiting for a dermatologist to look on this. We have been using ketoconazole I do not know that this is been doing any good at all. 2/24; left heel remains closed. The left buttock wound that was new reopening last week looks better. The left lateral calf appears better also although still requires debridement. The major area on his foot is the left first second also requiring debridement. We have been putting Prisma on all wounds. I do not believe that the ketoconazole has done too much good for his feet. He will use Lotrisone I am going to give him a 2-week course of terbinafine. We still do not have a dermatology appointment 3/2 left  heel remains closed however there is skin over bone in this area I pointed this out to him today. The left buttock wound is epithelialized but still does not look completely stable. The area on the left leg required  debridement were using silver collagen here. With regards to his feet we changed to Lotrisone last week and silver alginate. 3/9; left heel remains closed. Left buttock remains closed. The area on the right foot is essentially closed. The left foot remains unchanged. Slightly smaller on the left lateral calf. Using silver collagen to both of these areas 3/16-Left heel remains closed. Area on right foot is closed. Left lateral calf above the lateral malleolus open wound requiring debridement with easy bleeding. Left dorsal wound proximal to first toe also debrided. Left ischial area open new. Patient has been using Prisma with wrapping every 3 days. Dermatology appointment is apparently tomorrow.Patient has completed his terbinafine 2-week course with some apparent improvement according to him, there is still flaking and dry skin in his foot on the left 3/23; area on the right foot is reopened. The area on the left anterior foot is about the same still a very necrotic adherent surface. He still has the area on the left leg and reopening is on the left buttock. He apparently saw dermatology although I do not have a note. According to the patient who is usually fairly well informed they did not have any good ideas. Put him on oral terbinafine which she is been on before. 3/30; using silver collagen to all wounds. Apparently his dermatologist put him on doxycycline and rifampin presumably some culture grew staph. I do not have this result. He remains on terbinafine although I have used terbinafine on him before 4/6; patient has had a fairly substantial reopening on the right foot between the first and second toes. He is finished his terbinafine and I believe is on doxycycline and rifampin still as prescribed by dermatology. We have been using silver collagen to all his wounds although the patient reports that he thinks silver alginate does better on the wounds on his buttock. 4/13; the area on his left  lateral calf about the same size but it did not require debridement. Left dorsal foot just proximal to the webspace between the first and second toes is about the same. Still nonviable surface. I note some superficial bronze discoloration of the dorsal part of his foot Right dorsal foot just proximal to the first and second toes also looks about the same. I still think there may be the same discoloration I noted above on the left Left buttock wound looks about the same 4/20; left lateral calf appears to be gradually contracting using silver collagen. 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 The left dorsal foot wound is debrided of tightly adherent necrotic debris and really cleans up quite nicely. The right area is worse with expansion. I did not debride this it is now over the base of the second toe The area on his left buttock is smaller no debridement is required using silver collagen 5/4; left calf continues to make good progress. He arrives with erythema around the wounds on his dorsal foot which even extends to the plantar aspect. Very concerning for coexistent infection. He is finished the erythromycin I gave him for possible erythrasma this does not seem to have helped. The area on the left foot is about the same base of the dorsal toes Is area on the buttock looks  improved on the left 5/11; left calf and left buttock continued to make good progress. Left foot is about the same to slightly improved. Major problem is on the right foot. He has not had an x-ray. Deep tissue culture I did last week showed both Enterobacter and E. coli. I did not change the doxycycline I put him on empirically although neither 1 of these were plated to doxycycline. He arrives today with the erythema looking worse on both the dorsal and plantar foot. Macerated skin on the bottom of the foot. he has not been systemically unwell 5/18-Patient returns at 1  week, left calf wound appears to be making some progress, left buttock wound appears slightly worse than last time, left foot wound looks slightly better, right foot redness is marginally better. X-ray of both feet show no air or evidence of osteomyelitis. Patient is finished his Omnicef and terbinafine. He continues to have macerated skin on the bottom of the left foot as well as right 5/26; left calf wound is better, left buttock wound appears to have multiple small superficial open areas with surrounding macerated skin. X-rays that I did last time showed no evidence of osteomyelitis in either foot. He is finished cefdinir and doxycycline. I do not think that he was on terbinafine. He continues to have a large superficial open area on the right foot anterior dorsal and slightly between the first and second toes. I did send him to dermatology 2 months ago or so wondering about whether they would do a fungal scraping. I do not believe they did but did do a culture. We have been using silver alginate to the toe areas, he has been using antifungals at home topically either ketoconazole or Lotrisone. We are using silver collagen on the left foot, silver alginate on the right, silver collagen on the left lateral leg and silver alginate on the left buttock 6/1; left buttock area is healed. We have the left dorsal foot, left lateral leg and right dorsal foot. We are using silver alginate to the areas on both feet and silver collagen to the area on his left lateral calf 6/8; the left buttock apparently reopened late last week. He is not really sure how this happened. He is tolerating the terbinafine. Using silver alginate to all wounds 6/15; left buttock wound is larger than last week but still superficial. Came in the clinic today with a report of purulence from the left lateral leg I did not identify any infection Both areas on his dorsal feet appear to be better. He is tolerating the terbinafine. Using  silver alginate to all wounds 6/22; left buttock is about the same this week, left calf quite a bit better. His left foot is about the same however he comes in with erythema and warmth in the right forefoot once again. Culture that I gave him in the beginning of May showed Enterobacter and E. coli. I gave him doxycycline and things seem to improve although neither 1 of these organisms was specifically plated. 6/29; left buttock is larger and dry this week. Left lateral calf looks to me to be improved. Left dorsal foot also somewhat improved right foot completely unchanged. The erythema on the right foot is still present. He is completing the Ceftin dinner that I gave him empirically [see discussion above.) 7/6 - All wounds look to be stable and perhaps improved, the left buttock wound is slightly smaller, per patient bleeds easily, completed ceftin, the right foot redness is less, he is  on terbinafine 7/13; left buttock wound about the same perhaps slightly narrower. Area on the left lateral leg continues to narrow. Left dorsal foot slightly smaller right foot about the same. We are using silver alginate on the right foot and Hydrofera Blue to the areas on the left. Unna boot on the left 2 layer compression on the right 7/20; left buttock wound absolutely the same. Area on lateral leg continues to get better. Left dorsal foot require debridement as did the right no major change in the 7/27; left buttock wound the same size necrotic debris over the surface. The area on the lateral leg is closed once again. His left foot looks better right foot about the same although there is some involvement now of the posterior first second toe area. He is still on terbinafine which I have given him for a month, not certain a centimeter major change 06/25/19-All wounds appear to be slightly improved according to report, left buttock wound looks clean, both foot wounds have minimal to no debris the right dorsal foot  has minimal slough. We are using Hydrofera Blue to the left and silver alginate to the right foot and ischial wound. 8/10-Wounds all appear to be around the same, the right forefoot distal part has some redness which was not there before, however the wound looks clean and small. Ischial wound looks about the same with no changes 8/17; his wound on the left lateral calf which was his original chronic venous insufficiency wound remains closed. Since I last saw him the areas on the left dorsal foot right dorsal foot generally appear better but require debridement. The area on his left initial tuberosity appears somewhat larger to me perhaps hyper granulated and bleeds very easily. We have been using Hydrofera Blue to the left dorsal foot and silver alginate to everything else 8/24; left lateral calf remains closed. The areas on his dorsal feet on the webspace of the first and second toes bilaterally both look better. The area on the left buttock which is the pressure ulcer stage II slightly smaller. I change the dressing to Hydrofera Blue to all areas 8/31; left lateral calf remains closed. The area on his dorsal feet bilaterally look better. Using Hydrofera Blue. Still requiring debridement on the left foot. No change in the left buttock pressure ulcers however 9/14; left lateral calf remains closed. Dorsal feet look quite a bit better than 2 weeks ago. Flaking dry skin also a lot better with the ammonium lactate I gave him 2 weeks ago. The area on the left buttock is improved. He states that his Roho cushion developed a leak and he is getting a new one, in the interim he is offloading this vigorously 9/21; left calf remains closed. Left heel which was a possible DTI looks better this week. He had macerated tissue around the left dorsal foot right foot looks satisfactory and improved left buttock wound. I changed his dressings to his feet to silver alginate bilaterally. Continuing Hydrofera Blue on the  left buttock. 9/28 left calf remains closed. Left heel did not develop anything [possible DTI] dry flaking skin on the left dorsal foot. Right foot looks satisfactory. Improved left buttock wound. We are using silver alginate on his feet Hydrofera Blue on the buttock. I have asked him to go back to the Lotrisone on his feet including the wounds and surrounding areas 10/5; left calf remains closed. The areas on the left and right feet about the same. A lot of this is epithelialized  however debris over the remaining open areas. He is using Lotrisone and silver alginate. The area on the left buttock using Hydrofera Blue 10/26. Patient has been out for 3 weeks secondary to Covid concerns. He tested negative but I think his wife tested positive. He comes in today with the left foot substantially worse, right foot about the same. Even more concerning he states that the area on his left buttock closed over but then reopened and is considerably deeper in one aspect than it was before [stage III wound] 11/2; left foot really about the same as last week. Quarter sized wound on the dorsal foot just proximal to the first second toes. Surrounding erythema with areas of denuded epithelium. This is not really much different looking. Did not look like cellulitis this time however. Right foot area about the same.. We have been using silver alginate alginate on his toes Left buttock still substantial irritated skin around the wound which I think looks somewhat better. We have been using Hydrofera Blue here. 11/9; left foot larger than last week and a very necrotic surface. Right foot I think is about the same perhaps slightly smaller. Debris around the circumference also addressed. Unfortunately on the left buttock there is been a decline. Satellite lesions below the major wound distally and now a an additional one posteriorly we have been using Hydrofera Blue but I think this is a pressure issue 11/16; left foot  ulcer dorsally again a very adherent necrotic surface. Right foot is about the same. Not much change in the pressure ulcer on his left buttock. 11/30; left foot ulcer dorsally basically the same as when I saw him 2 weeks ago. Very adherent fibrinous debris on the wound surface. Patient reports a lot of drainage as well. 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 We have been using Iodoflex, patient changed back to alginate because of the drainage. Area on his right dorsal foot really looks benign with a healthier surface certainly a lot better than on the left. Left buttock wounds all improved using Hydrofera Blue 12/7; left dorsal foot again no improvement. Tightly adherent debris. PCR culture I did last week only showed likely skin contaminant. I have gone ahead and done a punch biopsy of this which is about the last thing in terms of investigations I can think to do. He has known venous insufficiency and venous hypertension and this could be the issue here. The area on the right foot is about the same left buttock slightly worse according to our intake nurse secondary to [facility] Blue sticking to the wound 12/14; biopsy of the left foot that I did last time showed changes that could be related to wound healing/chronic stasis dermatitis phenomenon no neoplasm. We have been using silver alginate to both feet. I change the one on the left today to Sorbact and silver alginate to his other 2 wounds 12/28; the patient arrives with the following problems; Major issue is the dorsal left foot which continues to be a larger deeper wound area. Still with a completely nonviable surface Paradoxically the area mirror image on the right on the right dorsal foot appears to be getting better. He had some loss of dry denuded skin from the lower part of his original wound on the left lateral calf. Some of this area looked a little vulnerable and for this reason we put  him in wrap that on this side this week The area on his left buttock  is larger. He still has the erythematous circular area which I think is a combination of pressure, sweat. This does not look like cellulitis or fungal dermatitis 11/26/2019; -Dorsal left foot large open wound with depth. Still debris over the surface. Using Sorbact The area on the dorsal right foot paradoxically has closed over He has a reopening on the left ankle laterally at the base of his original wound that extended up into the calf. This appears clean. The left buttock wound is smaller but with very adherent necrotic debris over the surface. We have been using silver alginate here as well The patient had arterial studies done in 2017. He had biphasic waveforms at the dorsalis pedis and posterior tibial bilaterally. ABI in the left was 1.17. Digit waveforms were dampened. He has slight spasticity in the great toes I do not think a TBI would be possible 1/11; the patient comes in today with a sizable reopening between the first and second toes on the right. This is not exactly in the same location where we have been treating wounds previously. According to our intake nurse this was actually fairly deep but 0.6 cm. The area on the left dorsal foot looks about the same the surface is somewhat cleaner using Sorbact, his MRI is in 2 days. We have not managed yet to get arterial studies. The new reopening on the left lateral calf looks somewhat better using alginate. The left buttock wound is about the same using alginate 1/18; the patient had his ARTERIAL studies which were quite normal. ABI in the right at 1.13 with triphasic/biphasic waveforms on the left ABI 1.06 again with triphasic/biphasic waveforms. It would not have been possible to have done a toe brachial index because of spasticity. We have been using Sorbac to the left foot alginate to the rest of his wounds on the right foot left lateral calf and left buttock 1/25;  arrives in clinic with erythema and swelling of the left forefoot worse over the first MTP area. This extends laterally dorsally and but also posteriorly. Still has an area on the left lateral part of the lower part of his calf wound it is eschared and clearly not closed. Area on the left buttock still with surrounding irritation and erythema. Right foot surface wound dorsally. The area between the right and first and second toes appears better. 2/1; The left foot wound is about the same. Erythema slightly better I gave him a week of doxycycline empirically Right foot wound is more extensive extending between the toes to the plantar surface Left lateral calf really no open surface on the inferior part of his original wound however the entire area still looks vulnerable Absolutely no improvement in the left buttock wound required debridement. 2/8; the left foot is about the same. Erythema is slightly improved I gave him clindamycin last week. Right foot looks better he is using Lotrimin and silver alginate He has a breakdown in the left lateral calf. Denuded epithelium which I have removed Left buttock about the same were using Hydrofera Blue 2/15; left foot is about the same there is less surrounding erythema. Surface still has tightly adherent debris which I have debriding however not making any progress Right foot has a substantial wound on the medial right second toe between the first and second webspace. Still an open area on the left lateral calf distal area. Buttock wound is about the same 2/22; left foot is about the same less surrounding erythema. Surface has adherent debris. Polymen Ag  Right foot area significant wound between the first and second toes. We have been using silver alginate here Left lateral leg polymen Ag at the base of his original venous insufficiency wound Left buttock some improvement here 3/1; Right foot is deteriorating in the first second toe webspace. Larger and  more substantial. We have been using silver alginate. Left dorsal foot about the same markedly adherent surface debris using PolyMem Ag Left lateral calf surface debris using PolyMem AG Left buttock is improved again using PolyMem Ag. He is completing his terbinafine. The erythema in the foot seems better. He has been on this for 2 weeks 3/8; no improvement in any wound area in fact he has a small open area on the dorsal midfoot which is new this week. He has not gotten his foot x-rays yet 3/15; his x-rays were both negative for osteomyelitis of both feet. No major change in any of his wounds on the extremities however his buttock wounds are better. We have been using polymen on the buttocks, left lower leg. Iodoflex on the left foot and silver alginate on the right 3/22; arrives in clinic today with the 2 major issues are the improvement in the left dorsal foot wound which for once actually looks healthy with a nice healthy wound surface without debridement. Using Iodoflex here. Unfortunately on the left lateral calf which is in the distal part of his original wound he came to the clinic here for there was purulent drainage noted some increased breakdown scattered around the original area and a small area proximally. We we are using polymen here will change to silver alginate today. His buttock wound on the left is better and I think the area on the right first second toe webspace is also improved 3/29; left dorsal foot looks better. Using Iodoflex. Left ankle culture from deterioration last time grew E. coli, Enterobacter and Enterococcus. I will give him a course of cefdinir although that will not cover Enterococcus. The area on the right foot in the webspace of the first and second toe lateral first toe looks better. The area on his buttock is about healed Vascular appointment is on April 21. This is to look at his venous system vis--vis continued breakdown of the wounds on the left including the  left lateral leg and left dorsal foot he. He has had previous ablations on this side 4/5; the area between the right first and second toes lateral aspect of the first toe looks better. Dorsal aspect of the left first toe on the left foot also improved. Unfortunately the left lateral lower leg is larger and there is a second satellite wound superiorly. The usual superficial abrasions on the left buttock overall better but certainly not closed 4/12; the area between the right first and second toes is improved. Dorsal aspect of the left foot also slightly smaller with a vibrant healthy looking surface. No real change in the left lateral leg and the left buttock wound is healed He has an unaffordable co-pay for Apligraf. Appointment with vein and vascular with regards to the left leg venous part of the circulation is on 4/21 4/19; we continue to see improvement in all wound areas. Although this is minor. He has his vascular appointment on 4/21. The area on the left buttock has not reopened although right in the center of this area the skin looks somewhat threatened 4/26; the left buttock is unfortunately reopened. In general his left dorsal foot has a healthy surface and looks somewhat smaller  although it was not measured as such. The area between his first and second toe webspace on the right as a small wound against the first toe. The patient saw vascular surgery. The real question I was asking was about the small saphenous vein on the left. He has previously ablated left greater saphenous vein. Nothing further was commented on on the left. Right greater saphenous vein without reflux at the saphenofemoral junction or proximal thigh there was no indication for ablation of the right greater saphenous vein duplex was negative for DVT bilaterally. They did not think there was anything from a vascular surgery point of view that could be offered. They ABIs within normal limits 5/3; only small open area on  the left buttock. The area on the left lateral leg which was his original venous reflux is now 2 wounds both which look clean. We are using Iodoflex on the left dorsal foot which looks healthy and smaller. He is down to a very tiny area between the right first and second toes, using silver alginate 5/10; all of his wounds appear better. We have much better edema control in 4 layer compression on the left. This may be the factor that is allowing the left foot and left lateral calf to heal. He has external compression garments at home 04/14/20-All of his wounds are progressing well, the left forefoot is practically closed, left ischium appears to be about the same, right toe webspace is also smaller. The left lateral leg is about the same, continue using Hydrofera Blue to this, silver alginate to the ischium, Iodoflex to the toe space on the right 6/7; most of his wounds outside of the left buttock are doing well. The area on the left lateral calf and left dorsal foot are smaller. The area on the right foot in between the first and second toe webspace is barely visible although he still says there is some drainage here is the only reason I did not heal this out. Unfortunately the area on the left buttock almost looks like he has a skin tear from tape. He has open wound and then a large flap of skin that we are trying to get adherence over an area just next to the remaining wound 6/21; 2 week follow-up. I believe is been here for nurse visits. Miraculously the area between his first and second toes on the left dorsal foot is closed over. Still open on the right first second web space. The left lateral calf has 2 open areas. Distally this is more superficial. The proximal area had a little more depth and required debridement of adherent necrotic material. His buttock wound is actually larger we have been using silver alginate here 6/28; the patient's area on the left foot remains closed. Still open wet area  between the first and second toes on the right and also extending into the plantar aspect. We have been using silver alginate in this location. He has 2 areas on the left lower leg part of his original long wounds which I think are better. We have been using Hydrofera Blue here. Hydrofera Blue to the left buttock which is stable 7/12; left foot remains closed. Left ankle is closed. May be a small area between his right first and second toes the only truly open area is on the left buttock. We have been using Hydrofera Blue here 7/19; patient arrives with marked deterioration especially in the left foot and ankle. We did not put him in a compression wrap on  the left last week in fact he wore his juxta lite stockings on either side although he does not have an underlying stocking. He has a reopening on the left dorsal foot, left lateral ankle and a new area on the right dorsal ankle. More worrisome is the degree of erythema on the left foot extending on the lateral foot into the lateral lower leg on the left 7/26; the patient had erythema and drainage from the lateral left ankle last week. Culture of this grew MRSA resistant to doxycycline and clindamycin which are the 2 antibiotics we usually use with this patient who has multiple antibiotic allergies including linezolid, trimethoprim sulfamethoxazole. I had give him an empiric doxycycline and he comes in the area certainly looks somewhat better although it is blotchy in his lower leg. He has not been systemically unwell. He has had areas on the left dorsal foot which is a reopening, chronic wounds on the left lateral ankle. Both of these I think are secondary to chronic venous insufficiency. The area between his first and second toes is closed as far as I can tell. He had a new wrap injury on the right dorsal ankle last week. Finally he has an area on the left buttock. We have been using silver alginate to everything except the left buttock we are using  Hydrofera Blue 06/30/20-Patient returns at 1 week, has been given a sample dose pack of NUZYRA which is a tetracycline derivative [omadacycline], patient has completed those, we have been using silver alginate to almost all the wounds except the left ischium where we are using Hydrofera Blue all of them look better 8/16; since I last saw the patient he has been doing well. The area on the left buttock, left lateral ankle and left foot are all closed today. He has completed the [place] I gave him last time and tolerated this well. He still has open areas on the right dorsal ankle and in the right first second toe area which we are using silver alginate. 8/23; we put him in his bilateral external compression stockings last week as he did not have anything open on either leg except for concerning area between the right first and second toe. He comes in today with an area on the left dorsal foot slightly more proximal than the original wound, the left lateral foot but this is actually a continuation of the area he had on the left lateral ankle from last time. As well he is opened up on the left buttock again. 8/30; comes in today with things looking a lot better. The area on the left lower ankle has closed down as has the left foot but with eschar in both areas. The area on the dorsal right ankle is also epithelialized. Very little remaining of the left buttock wound. We have been using silver alginate on all wound areas 9/13; the area in the first second toe webspace on the right has fully epithelialized. He still has some vulnerable epithelium on the right and the ankle and the dorsal foot. He notes weeping. He is using his juxta lite stocking. On the left again the left dorsal foot is closed left lateral ankle is closed. We went to the juxta lite stocking here as well. Still vulnerable in the left buttock although only 2 small open areas remain here 9/27; 2-week follow-up. We did not look at his left leg  but the patient says everything is closed. He is a bit disturbed by the amount of edema  in his left foot he is using juxta lite stockings but asking about over the toes stockings which would be 30/40, will talk to him next time. According to him there is no open wound on either the left foot or the left ankle/calf He has an open area on the dorsal right calf which I initially point a wrap injury. He has superficial remaining wound on the left ischial tuberosity been using silver alginate although he says this sticks to the wound 10/5; we gave him 2-week follow-up but he called yesterday expressing some concerns about his right foot right ankle and the left buttock. He came in early. There is still no open areas on the left leg and that still in his juxta lite stocking 10/11; he only has 1 small area on the left buttock that remains measuring millimeters 1 mm. Still has the same irritated skin in this area. We recommended zinc oxide when this eventually closes and pressure relief is meticulously is he can do this. He still has an area on the dorsal part of his right first through third toes which is a bit irritated and still open and on the dorsal ankle near the crease of the ankle. We have been using silver alginate and using his own stocking. He has nothing open on the left leg or foot 10/25; 2-week follow-up. Not nearly as good on the left buttock as I was hoping. For open areas with 5 looking threatened small. He has the erythematous irritated chronic skin in this area. 1 area on the right dorsal ankle. He reports this area bleeds easily Right dorsal foot just proximal to the base of his toes We have been using silver alginate. 11/8; 2-week follow-up. Left buttock is about the same although I do not think the wounds are in the same location we have been using silver alginate. I have asked him to use zinc oxide on the skin around the wounds. He still has a small area on the right dorsal ankle he  reports this bleeds easily Right dorsal foot just proximal to the base of the toes does not have anything open although the skin is very dry and scaly He has a new opening on the nailbed of the left great toe. Nothing on the left ankle 11/29; 3-week follow-up. Left buttock has 2 open areas. And washing of these wounds today started bleeding easily. Suggesting very friable tissue. We have been using silver alginate. Right dorsal ankle which I thought was initially a wrap injury we have been using silver alginate. Nothing open between the toes that I can see. He states the area on the left dorsal toe nailbed healed after the last visit in 2 or 3 days 12/13; 3-week follow-up. His left buttock now has 3 open areas but the original 2 areas are smaller using polymen here. Surrounding skin looks better. The right dorsal ankle is closed. He has a small opening on the right dorsal foot at the level of the third toe. In general the skin looks better here. He is wearing his juxta lite stocking on the left leg says there is nothing open 11/24/2020; 3 weeks follow-up. His left buttock still has the 3 open areas. We have been using polymen but due to lack of response he changed to [facility] area. Surrounding skin is dry erythematous and irritated looking. There is no evidence of infection either bacterial or fungal however there is loss of surface epithelium He still has very dry skin in his foot  causing irritation and erythema on the dorsal part of his toes. This is not responded to prolonged courses of antifungal simply looks dry and irritated 1/24; left buttock area still looks about the same he was unable to find the triad ointment that we had suggested. The area on the right lower leg just above the dorsal ankle has reopened and the areas on the right foot between the first second and second third toes and scaling on the bottom of the foot has been about the same for quite some time now. been using silver  alginate to all wound areas 2/7; left buttock wound looked quite good although not much smaller in terms of surface area surrounding skin looks better. Only a few dry flaking areas on the right foot in between the first and second toes the skin generally looks better here [ammonium lactate]. Finally the area on the right dorsal ankle is closed 2/21; There is no open area on the right foot even between the right first and second toe. Skin around this area dorsally and plantar aspects look better. He has a reopening of the area on the right ankle just above the crease of the ankle dorsally. I continue to think that this is probably friction from spasms may be even this time with his stocking under the compression stockings. Wounds on his left buttock look about the same there a couple of areas that have reopened. He has a total square area of loss of epithelialization. This does not look like infection it looks like a contact dermatitis but I just cannot determine to what 3/14; there is nothing on the right foot between the first and second toes this was carefully inspected under illumination. Some chronic irritation on the dorsal part of his foot from toes 1-3 at the base. Nothing really open here substantially. Still has an area on the right foot/ankle that is actually larger and hyper granulated. His buttock area on the left is just about closed however he has chronic inflammation with loss of the surface epithelial layer 3/28; 2-week follow-up. In clinic today with a new wound on the left anterior mid tibia. Says this happened about 2 weeks ago. He is not really sure how wonders about the spasticity of his legs at night whether that could have caused this other than that he does not have a good idea. He has been using topical antibiotics and silver alginate. The area on his right dorsal ankle seems somewhat better. Finally everything on his left buttock is closed. 4/11; 2-week follow-up. All of  his wounds are better except for the area over the ischium and left buttock which have opened up widely again. At least part of this is covered in necrotic fibrinous material another part had rolled nonviable skin. The area on the right ankle, left anterior mid tibia are both a lot better. He had no open wounds on either foot including the areas between the first and second toes 4/25; patient presents for 2-week follow-up. He states that the wounds are overall stable. He has no complaints today and states he is using Hydrofera Blue to open wounds. 5/9; have not seen this man in over a month. For my memory he has open areas on the left mid tibia and right ankle. T oday he has new open area on the right dorsal foot which we have not had a problem with recently. He has the sustained area on the left buttock He is also changed his insurance at the beginning  of the year [employer]. We will need prior authorizations for debridement 5/23; patient presents for 2-week follow-up. He has prior authorizations for debridement. He denies any issues in the past 2 weeks with his wound care. He has been using Hydrofera Blue to all the wounds. He does report a circular rash to the upper left leg that is new. He denies acute signs of infection. 6/6; 2-week follow-up. The patient has open wounds on the left buttock which are worse than the last time I saw this about a month ago. He also has a new area to me on the left anterior mid tibia with some surrounding erythema. The area on the dorsal ankle on the right is closed but I think this will be a friction injury every time this area is exposed to either our wraps or his compression stockings caused by unrelenting spasms in this leg. 6/20; 2-week follow-up. The patient has open wounds on the left buttock which is about the same. Using [facility] here. - The left mid tibia has a static amount of surrounding erythema. Also a raised area in the center. We have been  using Hydrofera Blue here. Finally he has broken down in his dorsal right foot extending between the first and second toes and going to the base of the first and second toe webspace. I have previously assumed that this was severe venous hypertension 7/5; 2-week follow-up The left buttock wound actually looks better. We are using Hydrofera Blue. He has extensive skin irritation around this area and I have not really been able to get that any better. I have tried Lotrisone i.e. antifungals and steroids. More most recently we have just been using Coloplast really looks about the same. The left mid tibia which was new last week culture to have very resistant staph aureus. Not only methicillin-resistant but doxycycline resistant. The patient has a plethora of antibiotic allergies including sulfa, linezolid. I used topical bacitracin on this but he has not started this yet. In addition he has an expanding area of erythema with a wound on the dorsal right foot. I did a deep tissue culture of this area today 7/12; Left buttock area actually looks better surrounding skin also looks less irritated. Left mid tibia looks about the same. He is using bacitracin this is not worse Right dorsal foot looks about the same as well. The left first toe also looks about the same 7/19; left buttock wound continues to improve in terms of open areas Left mid tibia is still concerning amount of swelling he is using bacitracin Dorsal left first toe somewhat smaller Right dorsal foot somewhat smaller 7/25; left buttock wound actually continues to improve Left mid tibia area has less swelling. I gave him all my samples of new Nuzyra. This seems to have helped although the wound is still open it. His abrasion closed by here Left dorsal great toe really no better. Still a very nonviable surface Right dorsal foot perhaps some better. We have been using bacitracin and silver nitrate to the areas on his lower legs and Hydrofera  Blue to the area on the buttock. 8/16 Disappointed that his left buttock wound is actually more substantial. Apparently during the last nurse visit these were both very small. He has continued irritation to a large area of skin on his buttock. I have never been able to totally explain this although I think it some combination of the way he sits, pressure, moisture. He is not incontinent enough to contribute to  this. Left dorsal great toe still fibrinous debris on the surface that I have debrided today Large area across the dorsal right toes. The area on the left anterior mid tibia has less swelling. He completed the [place]. This does not look infected although the tissue is still fried 8/30; 2-week follow-up. Left buttock areas not improved. We used Hydrofera Blue on this. Weeping wet with the surrounding erythema that I have not been able to control even with Lotrisone and topical Coloplast Left dorsal great toe looks about the same More substantial area again at the base of his toes on the left which is new this week. Area across the dorsal right toes looks improved The left anterior mid tibia looks like it is trying to close 9/13; 2-week follow-up. Using silver alginate on all of his wounds. The left dorsal foot does not look any better. He has the area on the dorsal toe and also the areas at the base of all of his toes 1 through 3. On the right foot he has a similar pattern in a similar area. He has the area on his left mid tibia that looks fairly healthy. Finally the area on his left buttock looks somewhat bette 9/20; culture I did of the left foot which was a deep tissue culture last time showed E. coli he has erythema around this wound. Still a completely necrotic surface. His right dorsal foot looks about the same. He has a very friable surface to the left anterior mid tibia. Both buttock wounds look better. We have been using silver alginate to all wounds 10/4; he has completed the  cephalexin that I gave him last time for the left foot. He is using topical gentamicin under silver alginate silver alginate being applied to all the wounds. Unfortunately all the wounds look irritated on his dorsal right foot dorsal left foot left mid tibia. I wonder if this could be a silver allergy. I am going to change him to [facility] on the lower extremity. The skin on the left buttock and left posterior thigh still flaking dry and irritated. This has continued no matter what I have applied topically to this. He has a solitary open wound which by itself does not look too bad however the entire area of surrounding skin does not change no matter what we have applied here 10/18; the area on the left dorsal foot and right dorsal foot both look better. The area on the right extends into the plantar but not between his toes. We have been using silver alginate. He still has a rectangular erythematous area around the area on the left tibia. The wound itself is very small. Finally everything on his left buttock looks a little larger the skin is erythematous 11/15; patient comes in with the left dorsal and right dorsal foot distally looking somewhat better. Still nonviable surface on the left foot which required debridement. He still has the area on the left anterior mid tibia although this looks somewhat better. He has a new area on the right lateral lower leg just above the ankle. Finally his left buttock looks terrible with multiple superficial open wounds geometric square shaped area of chronic erythema which I have not been able to sort through 11/29; right dorsal foot and left dorsal foot both look somewhat better. No debridement required. He has the fragile area on the left anterior mid tibia this looks and continues to look somewhat better. Right lateral lower leg just above the ankle we identified last  time also looks better. In general the area on his left buttock looks improved. We are  using Hydrofera Blue to all wound areas 12/13; right dorsal foot looks better. The area on the right lateral leg is healed. Left dorsal foot has 2 open areas both of which require debridement. The fragile area on the left anterior mid tibial looks better. Smaller area on his buttocks. Were using Hydrofera Blue 1/10; patient comes in with everything looking slightly larger and/or worse. This includes his left buttock, reopening of the left mid tibia, larger areas on the left dorsal foot and what looks to be a cellulitis on the right dorsal and plantar foot. We have been using Hydrofera Blue on all wounds. 1/17; right dorsal foot distally looks better today. The left foot has 2 open wounds that are about the same surrounding erythema. 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 No malignancy they did not stain for fungus 1/24; his right distal foot is not closed dry and scaly but the wound looks like it is contracting. I did not debride anything here. Problem on his left dorsal foot with expanding erythema. Apparently there were problems last week getting the Elesa Hacker however it is now available at the [employer] [facility] but a week later. He is using ketoconazole and Coloplast to the left buttock along with Hydrofera Blue this actually looks quite a bit better today. 1/31; right dorsal foot again is dry and scaly but looking to contract. He has been using a moisturizer on his feet at my request but he is not sure which 1. The left dorsal foot wounds look about the same there is erythema here that I marked last week however after course of Nuzyra it certainly is not any better but not any worse either. Finally on his left buttock the skin continues to look better he has the original wound but a new substantial area towards the gluteal cleft. Almost like a skin tear. I  used scissors to remove skin and subcutaneous tissue here silver nitrate and direct pressure 2/7; right dorsal foot. This does not look too much different from last week. Some erythema skin dry and scaly. No debridement. Left dorsal great toe again still not much improvement. I did remove flaking dry skin and callus from around the edge. Finally on his left buttock. The skin is somewhat better in the periwound. Surface wounds are superficial somewhat better than last week. 01/26/2022: Is a little bit of a mystery as to why his wounds fail to respond to treatments and actually seem to get worse. This is my first encounter with this patient. He was previously followed by Dr. Dellia Nims. Based upon my review of the chart, it seems that there is a little bit of a mystery as to why his wounds do not respond as anticipated to the interventions applied and sometimes even get worse. Biopsies have been performed and he was seen by dermatology in [place], but that did not shed any light on the matter. T oday, his gluteal wound is larger, with substantial drainage, rather malodorous. The food wounds are not terrible, but he has a lot of callus and scaly skin around these. He is currently getting silver alginate on the gluteal wound, with idodoflex to the feet. He is using lotrisone on his legs for the dry, scaly skin. 02/09/2022: There has really been no change to any of his  wounds. The gluteal wound less drainage and odor, but remains about the same size, the periwound skin remains oddly scaly. His lower extremity wounds also appear roughly the same size. They continue to accumulate a small amount of slough. The periwound on his feet and ankle wounds has dry eschar and loose dead skin. We have been using silver alginate on the gluteal wound and Iodoflex on his feet and ankle wounds. T the periwound around his gluteal lesion and Lotrisone on his feet and legs. o 02/23/2022: The right plantar foot wound is closed. The  gluteal site looks small but has continued to produce hypertrophic granulation tissue. The foot wounds all look about the same on the dorsal surface of the right foot; on the left, there is only a small open area at the site of where his left great toenail would have been. 03/16/2022: The right ankle wound is healed. The right dorsal foot wound is about the same. The left dorsal foot wound is quite a bit smaller and the ischial wound is nearly closed. 03/30/2022: The right ankle wound reopened. Both dorsal foot wounds are quite a bit smaller. Unfortunately, he appears to have sheared part of his ischial wound open further, perhaps during a transfer. 04/13/2022: The right ankle wound has hypertrophic granulation tissue present. The dorsal foot wounds continue to decrease in size. The ischial wound looks about the same today, no better, no worse. Electronic Signature(s) Signed: 04/13/2022 9:57:12 AM By: Fredirick Maudlin MD FACS Entered By: Fredirick Maudlin on 04/13/2022 09:57:11 -------------------------------------------------------------------------------- Chemical Cauterization Details Patient Name: Date of Service: Fricke, [name] 04/13/2022 8:15 A M Medical Record Number: [id number] Patient Account Number: [id number] Date of Birth/Sex: Treating RN: [birth year] (36 y.o. [name] Primary Care Provider: [place], [place] Other Clinician: Referring Provider: Treating Provider/Extender: Cornelia Copa [name]: 094 Procedure Performed for: Wound #62 Right,Anterior Ankle Performed By: Physician Fredirick Maudlin, MD Post Procedure Diagnosis Same as Pre-procedure Notes silver nitrate Electronic Signature(s) Signed: 04/13/2022 10:17:57 AM By: Fredirick Maudlin MD FACS Signed: 04/20/2022 4:31:14 PM By: Sharyn Creamer RN, BSN Entered By: Sharyn Creamer on 04/13/2022 09:21:26 -------------------------------------------------------------------------------- Physical Exam  Details Patient Name: Date of Service: Narayanan, [name] 04/13/2022 8:15 A M Medical Record Number: [id number] Patient Account Number: [id number] Date of Birth/Sex: Treating RN: December 17, 1985 (36 y.o. [name]: [place], [place] Other Clinician: Referring Provider: Treating Provider/Extender: Graciela Husbands, [name]: 327 Constitutional . . . . No acute distress. Respiratory Normal work of breathing on room air. Notes 04/13/2022: The right ankle wound has hypertrophic granulation tissue present. The dorsal foot wounds continue to decrease in size. The ischial wound looks about the same today, no better, no worse. Electronic Signature(s) Signed: 04/13/2022 9:57:54 AM By: Fredirick Maudlin MD FACS Entered By: Fredirick Maudlin on 04/13/2022 09:57:54 -------------------------------------------------------------------------------- Physician Orders Details Patient Name: Date of Service: Serna, [name] 04/13/2022 8:15 A M Medical Record Number: [id number] Patient Account Number: [id number] Date of Birth/Sex: Treating RN: 1986/02/11 (36 y.o. 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 - Dr Celine Ahr - Room 1 - Tuesday 6/6 at 8:15 Return A Bathing/ Shower/ Hygiene May shower and wash wound with soap and water. - on days that dressing is changed Edema Control - Lymphedema / SCD / Other Elevate legs to the level of the heart or above for 30 minutes daily and/or when sitting, a frequency of: - throughout the day Moisturize legs daily. 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 Peri-Wound Care: Zinc Oxide Ointment 30g tube Every Other Day/30 Days Discharge Instructions: Apply Zinc Oxide to periwound with each dressing change Prim Dressing: KerraCel Ag Gelling Fiber Dressing, 4x5 in (silver alginate) (Generic) Every Other Day/30 Days ary Discharge Instructions: Apply silver alginate to wound bed as instructed Secondary Dressing: ABD Pad, 8x10 (Generic) Every Other Day/30 Days Discharge Instructions: Apply over primary dressing as directed. Secondary Dressing: Woven Gauze Sponge, Non-Sterile 4x4 in Every Other Day/30 Days Discharge Instructions: Apply over primary dressing as directed. Secured With: 57M Medipore H Soft Cloth Surgical T 4 x 2 (in/yd) (Generic) Every Other Day/30 Days ape Discharge Instructions: Secure dressing with tape as directed. Wound #52 - Foot Wound Laterality: Dorsal, Right Cleanser: Soap and Water Every Other Day/30 Days Discharge Instructions: May shower and wash wound with dial antibacterial soap and water prior to dressing change. Peri-Wound Care: Triamcinolone 15 (g) Every Other Day/30 Days Discharge  Instructions: Use triamcinolone 15 (g) as directed Peri-Wound Care: Sween Lotion (Moisturizing lotion) Every Other Day/30 Days Discharge Instructions: Apply Aquaphor moisturizing lotion as directed Peri-Wound Care: Lotrisone Every Other Day/30 Days Discharge Instructions: Apply Lotrisone to periwound Prim Dressing: KerraCel Ag Gelling Fiber Dressing, 2x2 in (silver alginate) Every Other Day/30 Days ary Discharge Instructions: Apply silver alginate to wound bed as instructed Secondary Dressing: Woven Gauze Sponge, Non-Sterile 4x4 in Every Other Day/30 Days Discharge Instructions: Apply over primary dressing as directed. Secured With: [employer], 4.5x3.1 (in/yd) (Generic) Every Other Day/30 Days Discharge Instructions: Secure with Kerlix as directed. Secured With: 57M Medipore H Soft Cloth Surgical T ape, 4 x 10 (in/yd) (Generic) Every Other Day/30 Days Discharge Instructions: Secure with tape as directed. Wound #56 - Foot Wound Laterality: Dorsal, Left Cleanser: Soap and Water Every Other Day/30 Days Discharge Instructions: May shower and wash wound with dial antibacterial soap and water prior to dressing change. Peri-Wound Care: Triamcinolone 15 (g) Every Other Day/30 Days Discharge Instructions: Use triamcinolone 15 (g) as directed Peri-Wound Care: Sween Lotion (Moisturizing lotion) Every Other Day/30 Days Discharge Instructions: Apply Aquaphor moisturizing lotion as directed Peri-Wound Care: Lotrisone Every Other Day/30 Days Discharge Instructions: Apply Lotrisone to periwound Prim Dressing: KerraCel Ag Gelling Fiber Dressing, 2x2 in (silver alginate) Every Other Day/30 Days ary Discharge Instructions: Apply silver alginate to wound bed as instructed Secondary Dressing: Woven Gauze Sponge, Non-Sterile 4x4 in Every Other Day/30 Days Discharge Instructions: Apply over primary dressing as directed. Secured With: [employer]  Sterile, 4.5x3.1 (in/yd) (Generic) Every Other Day/30  Days Discharge Instructions: Secure with Kerlix as directed. Secured With: 61M Medipore H Soft Cloth Surgical T ape, 4 x 10 (in/yd) (Generic) Every Other Day/30 Days Discharge Instructions: Secure with tape as directed. Wound #62 - Ankle Wound Laterality: Right, Anterior Cleanser: Soap and Water Every Other Day/30 Days Discharge Instructions: May shower and wash wound with dial antibacterial soap and water prior to dressing change. Peri-Wound Care: Triamcinolone 15 (g) Every Other Day/30 Days Discharge Instructions: Use triamcinolone 15 (g) as directed Peri-Wound Care: Sween Lotion (Moisturizing lotion) Every Other Day/30 Days Discharge Instructions: Apply Aquaphor moisturizing lotion as directed Peri-Wound Care: Lotrisone Every Other Day/30 Days Discharge Instructions: Apply Lotrisone to periwound Prim Dressing: KerraCel Ag Gelling Fiber Dressing, 2x2 in (silver alginate) Every Other Day/30 Days ary Discharge Instructions: Apply silver alginate to wound bed as instructed Secondary Dressing: Woven Gauze Sponge, Non-Sterile 4x4 in Every Other Day/30 Days Discharge Instructions: Apply over primary dressing as directed. Secured With: [employer], 4.5x3.1 (in/yd) (Generic) Every Other Day/30 Days Discharge Instructions: Secure with Kerlix as directed. Secured With: 61M Medipore H Soft Cloth Surgical T ape, 4 x 10 (in/yd) (Generic) Every Other Day/30 Days Discharge Instructions: Secure with tape as directed. Electronic Signature(s) Signed: 04/13/2022 10:17:57 AM By: Fredirick Maudlin MD FACS Entered By: Fredirick Maudlin on 04/13/2022 09:58:17 -------------------------------------------------------------------------------- Problem List Details Patient Name: Date of Service: Rauth, [name] 04/13/2022 8:15 A M Medical Record Number: [id number] Patient Account Number: [id number] Date of Birth/Sex: Treating RN: 10-18-1986 (36 y.o. 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, stage 3 09/17/2019 No Yes L97.821 Non-pressure chronic ulcer of other part of left lower leg limited to breakdown 03/30/2021 No Yes of skin L97.518 Non-pressure chronic ulcer of other part of right foot with other specified 12/01/2021 No Yes severity 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, complete 01/02/2016 No Yes L97.521 Non-pressure chronic ulcer of other part of left foot limited to breakdown of 07/25/2018 No Yes skin Inactive Problems ICD-10 Code Description Active Date Inactive Date 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, stage 3 01/02/2016 01/02/2016 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, stage 3 12/05/2017 12/05/2017 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 10/07/2016 10/07/2016 L97.321 Non-pressure chronic ulcer of left ankle limited to breakdown of skin 11/26/2019 11/26/2019 L97.311 Non-pressure chronic ulcer of right ankle limited to breakdown of skin 06/09/2020 06/09/2020 L89.302 Pressure ulcer of unspecified buttock, stage 2 03/05/2019 03/05/2019 L03.116 Cellulitis of left lower limb 12/17/2019 12/17/2019 L97.818 Non-pressure chronic ulcer of other part of right lower leg with other specified severity 10/06/2021 10/06/2021 L97.311 Non-pressure chronic ulcer of right ankle limited to breakdown of skin 03/30/2021 03/30/2021 A49.02 Methicillin resistant Staphylococcus aureus infection, unspecified site 06/02/2021 06/02/2021 L03.115 Cellulitis of right lower limb 12/01/2021 12/01/2021 Resolved Problems ICD-10 Code Description  Active Date Resolved Date L89.623 Pressure ulcer of left heel, stage 3 01/10/2018 01/10/2018 L03.115 Cellulitis of right lower limb 08/30/2016 08/30/2016 L89.322 Pressure ulcer of left buttock, stage 2 11/27/2018 11/27/2018 L89.322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 has now completed the doxycycline and he is back to his usual degree of chronic venous inflammation/stasis dermatitis. He reminds me he has had ablations surgery here 08/04/17; continued improvement with Iodoflex to the left lateral leg wound in terms of the surface of the wound although the dimensions are better. 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 Problematic areas on the plantar aspect of the first second toe web space on the left and the dorsal aspect of the first second toe web space on the right. At one point I felt these were probably related to chronic fungal infections in treated him aggressively for this although we have not made any improvement here. 08/11/17; left lateral leg. Surface continues to improve with the Iodoflex although we are not seeing much improvement in overall wound dimensions. Areas  on his plantar left foot and right foot show  no improvement. In fact the right foot looks somewhat worse 08/18/17; left lateral leg. We changed to [facility] Blue last week after a prolonged course of Iodoflex which helps get the surface better. It appears that the wound with is improved. Continue with difficult areas on the left dorsal first second and plantar first second on the right 09/01/17; patient arrives in clinic today having had a temperature of 103 yesterday. He was seen in the ER and [name]. The patient was concerned he could have cellulitis again in the right leg however they diagnosed him with a UTI and he is now on Keflex. He has a history of cellulitis which is been recurrent and difficult but this is been in the left leg, in the past 5 use doxycycline. He does in and out catheterizations at home which are risk factors for UTI 09/08/17; patient will be completing his Keflex this weekend. The erythema on the left leg is considerably better. He has a new wound today on the medial part of the right leg small superficial almost looks like a skin tear. He has worsening of the area on the right dorsal first and second toe. His major area on the left lateral leg is better. Using Hydrofera Blue on all areas 09/15/17; gradual reduction in width on the long wound in the left lateral leg. No debridement required. He also has wounds on the plantar aspect of his left first second toe web space and on the dorsal aspect of the right first second toe web space. 09/22/17; there continues to be very gradual improvements in the dimensions of the left lateral leg wound. He hasn't round erythematous spot with might be pressure on his wheelchair. There is no evidence obviously of infection no purulence no warmth ooHe has a dry scaled area on the plantar aspect of the left first second toe ooImproved area on the dorsal right first second toe. 09/29/17; left lateral leg wound continues to improve in dimensions mostly with an is still a fairly long but  increasingly narrow wound. ooHe has a dry scaled area on the plantar aspect of his left first second toe web space ooIncreasingly concerning area on the dorsal right first second toe. In fact I am concerned today about possible cellulitis around this wound. The areas extending up his second toe and although there is deformities here almost appears to abut on the nailbed. 10/06/17; left lateral leg wound continues to make very gradual progress. Tissue culture I did from the right first second toe dorsal foot last time grew MRSA and enterococcus which was vancomycin sensitive. This was not sensitive to clindamycin or doxycycline. He is allergic to Zyvox and sulfa we have therefore arrange for him to have dalvance infusion tomorrow. He is had this in the past and tolerated it well 10/20/17; left lateral leg wound continues to make decent progress. This is certainly reduced in terms of with there is advancing epithelialization.ooThe cellulitis in the right foot looks better although he still has a deep wound in the dorsal aspect of the first second toe web space. Plantar left first toe web space on the left I think is making some progress 10/27/17; left lateral leg wound continues to make decent progress. Advancing epithelialization.using Hydrofera Blue ooThe right first second toe web space wound is better-looking using silver alginate ooImprovement in the left plantar first second toe web space. Again using silver alginate 11/03/17 left lateral leg wound continues to make  decent progress albeit slowly. Using Hydrofera Blue ooThe right per second toe web space continues to be a very problematic looking punched out wound. I obtained a piece of tissue for deep culture I did extensively treated this for fungus. It is difficult to imagine that this is a pressure area as the patient states other than going outside he doesn't really wear shoes at home ooThe left plantar first second toe web space  looked fairly senescent. Necrotic edges. This required debridement oochange to Hydrofera Blue to all wound areas 11/10/17; left lateral leg wound continues to contract. Using Hydrofera Blue ooOn the right dorsal first second toe web space dorsally. Culture I did of this area last week grew MRSA there is not an easy oral option in this patient was multiple antibiotic allergies or intolerances. This was only a rare culture isolate I'm therefore going to use Bactroban under silver alginate ooOn the left plantar first second toe web space. Debridement is required here. This is also unchanged 11/17/17; left lateral leg wound continues to contract using Hydrofera Blue this is no longer the major issue. ooThe major concern here is the right first second toe web space. He now has an open area going from dorsally to the plantar aspect. There is now wound on the inner lateral part of the first toe. Not a very viable surface on this. There is erythema spreading medially into the forefoot. ooNo major change in the left first second toe plantar wound 11/24/17; left lateral leg wound continues to contract using Hydrofera Blue. Nice improvement today ooThe right first second toe web space all of this looks a lot less angry than last week. I have given him clindamycin and topical Bactroban for MRSA and terbinafine for the possibility of underlining tinea pedis that I could not control with ketoconazole. Looks somewhat better ooThe area on the plantar left first second toe web space is weeping with dried debris around the wound 12/01/17; left lateral leg wound continues to contract he Hydrofera Blue. It is becoming thinner in terms of with nevertheless it is making good improvement. ooThe right first second toe web space looks less angry but still a large necrotic-looking wounds starting on the plantar aspect of the right foot extending between the toes and now extensively on the base of the right second toe. I  gave him clindamycin and topical Bactroban for MRSA anterior benefiting for the possibility of underlying tinea pedis. Not looking better today ooThe area on the left first/second toe looks better. Debrided of necrotic debris 12/05/17* the patient was worked in urgently today because over the weekend he found blood on his incontinence bad when he woke up. He was found to have an ulcer by his wife who does most of his wound care. He came in today for [place] to look at this. He has not had a history of wounds in his buttocks in spite of his paraplegia. 12/08/17; seen in follow-up today at his usual appointment. He was seen earlier this week and found to have a new wound on his buttock. We also follow him for wounds on the left lateral leg, left first second toe web space and right first second toe web space 12/15/17; we have been using Hydrofera Blue to the left lateral leg which has improved. The right first second toe web space has also improved. Left first second toe web space plantar aspect looks stable. The left buttock has worsened using Santyl. Apparently the buttock has drainage 12/22/17; we have been  using Hydrofera Blue to the left lateral leg which continues to improve now 2 small wounds separated by normal skin. He tells [place] he had a fever up to 100 yesterday he is prone to UTIs but has not noted anything different. He does in and out catheterizations. The area between the first and second toes today does not look good necrotic surface covered with what looks to be purulent drainage and erythema extending into the third toe. I had gotten this to something that I thought look better last time however it is not look good today. He also has a necrotic surface over the buttock wound which is expanded. I thought there might be infection under here so I removed a lot of the surface with a #5 curet though nothing look like it really needed culturing. He is been using Santyl to this area 12/27/17; his  original wound on the left lateral leg continues to improve using Hydrofera Blue. I gave him samples of Baxdella although he was unable to take them out of fear for an allergic reaction ["lump in his throat"].the culture I did of the purulent drainage from his second toe last week showed both enterococcus and a set Enterobacter I was also concerned about the erythema on the bottom of his foot although paradoxically although this looks somewhat better today. 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 We've been using silver alginate here. They came up today that he sleeps in a recliner, I'm not sure why but I asked him to stop this 01/03/18; his original wound we've been using Hydrofera Blue is now separated into 2 areas. ooUlcer on his left buttock is better he is off the recliner and sleeping in bed ooFinally both wound areas between his first and second toes also looks some better 01/10/18; his original wound on the left lateral leg is now separated into 2 wounds we've been using Hydrofera Blue ooUlcer on his left buttock has some drainage. There is a small probing site going into muscle layer superiorly.using silver alginate -He arrives today with a deep tissue injury on the left heel ooThe wound on the dorsal aspect of his first second toe on the left looks a lot betterusing silver alginate ketoconazole ooThe area on the first second toe web space on the right also looks a lot bette 01/17/18; his original wound on the left lateral leg continues to progress using Hydrofera Blue ooUlcer on his left buttock also is smaller surface healthier except for a small probing site going into the muscle layer superiorly. 2.4 cm of tunneling in this area ooDTI on his left heel we have only been offloading. Looks better than last week no threatened open no evidence of infection oothe wound on the dorsal aspect of  the first second toe on the left continues to look like it's regressing we have only been using silver alginate and terbinafine orally ooThe area in the first second toe web space on the right also looks to be a lot better using silver alginate and terbinafine I think this was prompted by tinea pedis 01/31/18; the patient was hospitalized in [place] last week apparently for a complicated UTI. He was discharged on cefepime he does in and out catheterizations. In the hospital he was discovered M I don't mild elevation of AST and ALT and the terbinafine was stopped.predictably the pressure ulcer on s his buttock looks betterusing silver alginate. The area  on the left lateral leg also is better using Hydrofera Blue. The area between the first and second toes on the left better. First and second toes on the right still substantial but better. Finally the DTI on the left heel has held together and looks like it's resolving 02/07/18-he is here in follow-up evaluation for multiple ulcerations. He has new injury to the lateral aspect of the last issue a pressure ulcer, he states this is from adhesive removal trauma. He states he has tried multiple adhesive products with no success. All other ulcers appear stable. The left heel DTI is resolving. We will continue with same treatment plan and follow-up next week. 02/14/18; follow-up for multiple areas. ooHe has a new area last week on the lateral aspect of his pressure ulcer more over the posterior trochanter. The original pressure ulcer looks quite stable has healthy granulation. 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 Almost fully epithelialized on the original superior area using Hydrofera Blue ooDTI on the left heel has peeled off this week to reveal a small superficial wound under denuded skin and subcutaneous tissue ooBoth areas between the  first and second toes look better including nothing open on the left 02/21/18; ooThe patient's wounds on his left ischial tuberosity and posterior left greater trochanter actually looked better. He has a large area of irritation around the area which I think is contact dermatitis. I am doubtful that this is fungal ooHis original wound on the left lateral calf continues to improve we have been using Hydrofera Blue ooThere is no open area in the left first second toe web space although there is a lot of thick callus ooThe DTI on the left heel required debridement today of necrotic surface eschar and subcutaneous tissue using silver alginate ooFinally the area on the right first second toe webspace continues to contract using silver alginate and ketoconazole 02/28/18 ooLeft ischial tuberosity wounds look better using silver alginate. ooOriginal wound on the left calf only has one small open area left using Hydrofera Blue ooDTI on the left heel required debridement mostly removing skin from around this wound surface. Using silver alginate ooThe areas on the right first/second toe web space using silver alginate and ketoconazole 03/08/18 on evaluation today patient appears to be doing decently well as best I can tell in regard to his wounds. This is the first time that I have seen him as he generally is followed by Dr. Dellia Nims. With that being said none of his wounds appear to be infected he does have an area where there is some skin covering what appears to be a new wound on the left dorsal surface of his great toe. This is right at the nail bed. With that being said I do believe that debrided away some of the excess skin can be of benefit in this regard. Otherwise he has been tolerating the dressing changes without complication. 03/14/18; patient arrives today with the multiplicity of wounds that we are following. He has not been systemically unwell ooOriginal wound on the left lateral calf now  only has 2 small open areas we've been using Hydrofera Blue which should continue ooThe deep tissue injury on the left heel requires debridement today. We've been using silver alginate ooThe left first second toe and the right first second toe are both are reminiscence what I think was tinea pedis. Apparently some of the callus Surface between the toes  was removed last week when it started draining. ooPurulent drainage coming from the wound on the ischial tuberosity on the left. 03/21/18-He is here in follow-up evaluation for multiple wounds. 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 He tolerated debridement. The only change to last week's recommendations is to discontinue antifungal cream between toes. He will follow-up next week 03/28/18; following up for multiple wounds;Concern this week is streaking redness and swelling in the right foot. He is going to need antibiotics for this. 03/31/18; follow-up for right foot cellulitis. Streaking redness and swelling in the right foot on 03/28/18. 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 I put him on clindamycin 300 mg every 6 and brought him in for a quick check. He has an open wound between his first and second toes on the right foot as a potential source. 04/04/18; ooRight foot cellulitis is resolving he is completing clindamycin. This is truly good news ooLeft lateral calf wound which is initial wound only has one small open area inferiorly this is close to healing out. He has compression stockings. We will use Hydrofera Blue right down to the epithelialization of this ooNonviable surface on the left heel which was initially pressure with a DTI. We've been using Hydrofera Blue. I'm going to switch this back to silver alginate ooLeft first second toe/tinea pedis this looks better using silver alginate ooRight first second toe tinea pedis using silver  alginate ooLarge pressure ulcers on theLeft ischial tuberosity. Small wound here Looks better. I am uncertain about the surface over the large wound. Using silver alginate 04/11/18; ooCellulitis in the right foot is resolved ooLeft lateral calf wound which was his original wounds still has 2 tiny open areas remaining this is just about closed ooNonviable surface on the left heel is better but still requires debridement ooLeft first second toe/tinea pedis still open using silver alginate ooRight first second toe wound tinea pedis I asked him to go back to using ketoconazole and silver alginate ooLarge pressure ulcers on the left ischial tuberosity this shear injury here is resolved. Wound is smaller. No evidence of infection using silver alginate 04/18/18; ooPatient arrives with an intense area of cellulitis in the right mid lower calf extending into the right heel area. Bright red and warm. Smaller area on the left anterior leg. He has a significant history of MRSA. He will definitely need antibioticsoodoxycycline ooHe now has 2 open areas on the left ischial tuberosity the original large wound and now a satellite area which I think was above his initial satellite areas. Not a wonderful surface on this satellite area surrounding erythema which looks like pressure related. ooHis left lateral calf wound again his original wound is just about closed ooLeft heel pressure injury still requiring debridement ooLeft first second toe looks a lot better using silver alginate ooRight first second toe also using silver alginate and ketoconazole cream also looks better 04/20/18; the patient was worked in early today out of concerns with his cellulitis on the right leg. I had started him on doxycycline. This was 2 days ago. His wife was concerned about the swelling in the area. Also concerned about the left buttock. He has not been systemically unwell no fever chills. No nausea vomiting or  diarrhea 04/25/18; the patient's left buttock wound is continued to deteriorate he is using Hydrofera Blue. He is still completing clindamycin for the cellulitis on the right leg although all of this looks better.  05/02/18 ooLeft buttock wound still with a lot of drainage and a very tightly adherent fibrinous necrotic surface. He has a deeper area superiorly ooThe left lateral calf wound is still closed ooDTI wound on the left heel necrotic surface especially the circumference using Iodoflex ooAreas between his left first second toe and right first second toe both look better. Dorsally and the right first second toe he had a necrotic surface although at smaller. In using silver alginate and ketoconazole. I did a culture last week which was a deep tissue culture of the reminiscence of the open wound on the right first second toe dorsally. This grew a few Acinetobacter and a few methicillin-resistant staph aureus. Nevertheless the area actually this week looked better. I didn't feel the need to specifically address this at least in terms of systemic antibiotics. 05/09/18; wounds are measuring larger more drainage per our intake. We are using Santyl covered with alginate on the large superficial buttock wounds, Iodosorb on the left heel, ketoconazole and silver alginate to the dorsal first and second toes bilaterally. 05/16/18; ooThe area on his left buttock better in some aspects although the area superiorly over the ischial tuberosity required an extensive debridement.using Santyl ooLeft heel appears stable. Using Iodoflex ooThe areas between his first and second toes are not bad however there is spreading erythema up the dorsal aspect of his left foot this looks like cellulitis again. He is insensate the erythema is really very brilliant.o Erysipelas He went to see an allergist days ago because he was itching part of this he had lab work done. This showed a white count of 15.1 with 70%  neutrophils. Hemoglobin of 11.4 and a platelet count of 659,000. Last white count we had in Epic was a 2-1/2 years ago which was 25.9 but he was ill at the time. He was able to show me some lab work that was done by his primary physician the pattern is about the same. 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 But prompted me to go ahead and do x-rays of both feet and the pelvis rule out osteomyelitis. He also had a comprehensive metabolic panel this was reasonably normal his albumin was 3.7 liver function tests BUN/creatinine all normal 05/23/18; x-rays of both his feet from last week were negative for underlying pulmonary abnormality. The x-ray of his pelvis however showed mild irregularity in the left ischial which may represent some early osteomyelitis. The wound in the left ischial continues to get deeper clearly now exposed muscle. Each week necrotic surface material over this area. Whereas the rest of the wounds do not look so bad. ooThe left ischial wound we have been using Santyl and calcium alginate ooT the left heel surface necrotic debris using Iodoflex o ooThe left lateral leg is still healed ooAreas on the left dorsal foot and the right dorsal foot are about the same. There is some inflammation on the left which might represent contact dermatitis, fungal dermatitis I am doubtful cellulitis although this looks better than last week 05/30/18; CT scan done at Hospital did not show any osteomyelitis or abscess. Suggested the possibility of underlying cellulitis although I don't see a lot of evidence of this at the bedside ooThe wound itself on the left buttock/upper thigh actually looks somewhat better. No debridement ooLeft heel also looks better no debridement continue Iodoflex ooBoth dorsal first second toe spaces appear better using Lotrisone. Left still required debridement 06/06/18; ooIntake reported some purulent  looking drainage  from the left gluteal wound. Using Santyl and calcium alginate ooLeft heel looks better although still a nonviable surface requiring debridement ooThe left dorsal foot first/second webspace actually expanding and somewhat deeper. I may consider doing a shave biopsy of this area ooRight dorsal foot first/second webspace appears stable to improved. Using Lotrisone and silver alginate to both these areas 06/13/18 ooLeft gluteal surface looks better. Now separated in the 2 wounds. No debridement required. Still drainage. We'll continue silver alginate ooLeft heel continues to look better with Iodoflex continue this for at least another week ooOf his dorsal foot wounds the area on the left still has some depth although it looks better than last week. We've been using Lotrisone and silver alginate 06/20/18 ooLeft gluteal continues to look better healthy tissue ooLeft heel continues to look better healthy granulation wound is smaller. He is using Iodoflex and his long as this continues continue the Iodoflex ooDorsal right foot looks better unfortunately dorsal left foot does not. There is swelling and erythema of his forefoot. He had minor trauma to this several days ago but doesn't think this was enough to have caused any tissue injury. Foot looks like cellulitis, we have had this problem before 06/27/18 on evaluation today patient appears to be doing a little worse in regard to his foot ulcer. 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 Both of which would really be his only options for treating this infection. In the past he has been given and effusion of Orbactiv. This is done very well for him in the past again it's one time dosing IV antibiotic therapy. Subsequently I do believe this is something we're gonna need to see about doing at this point in time.  Currently his other wounds seem to be doing somewhat better in my pinion I'm pretty happy in that regard. 07/03/18 on evaluation today patient's wounds actually appear to be doing fairly well. He has been tolerating the dressing changes without complication. All in all he seems to be showing signs of improvement. In regard to the antibiotics he has been dealing with infectious disease since I saw him last week as far as getting this scheduled. In the end [facility]'s [facility] this coming Friday. In the meantime he has been continuing to perform the dressing changes in such as previous. There does not appear to be any evidence of infection worsengin at this time. 07/10/18; ooSince I last saw this man 2 weeks ago things have actually improved. IV antibiotics of resulted in less forefoot erythema although there is still some present. He is not systemically unwell ooLeft buttock wounds o2 now have no depth there is increased epithelialization Using silver alginate ooLeft heel still requires debridement using Iodoflex ooLeft dorsal foot still with a sizable wound about the size of a border but healthy granulation ooRight dorsal foot still with a slitlike area using silver alginate 07/18/18; the patient's cellulitis in the left foot is improved in fact I think it is on its way to resolving. ooLeft buttock wounds o2 both look better although the larger one has hypertension granulation we've been using silver alginate ooLeft heel has some thick circumferential redundant skin over the wound edge which will need to be removed today we've been using Iodoflex ooLeft dorsal foot is still a  sizable wound required debridement using silver alginate ooThe right dorsal foot is just about closed only a small open area remains here 07/25/18; left foot cellulitis is resolved ooLeft buttock wounds o2 both look better. Hyper-granulation on the major  area ooLeft heel as some debris over the surface but otherwise looks a healthier wound. Using silver collagen ooRight dorsal foot is just about closed 07/31/18; arrives with our intake nurse worried about purulent drainage from the buttock. We had hyper-granulation here last week ooHis buttock wounds o2 continue to look better ooLeft heel some debris over the surface but measuring smaller. ooRight dorsal foot unfortunately has openings between the toes ooLeft foot superficial wound looks less aggravated. 08/07/18 ooButtock wounds continue to look better although some of her granulation and the larger medial wound. silver alginate ooLeft heel continues to look a lot better.silver collagen ooLeft foot superficial wound looks less stable. Requires debridement. He has a new wound superficial area on the foot on the lateral dorsal foot. ooRight foot looks better using silver alginate without Lotrisone 08/14/2018; patient was in the ER last week diagnosed with a UTI. He is now on Cefpodoxime and Macrodantin. ooButtock wounds continued to be smaller. Using silver alginate ooLeft heel continues to look better using silver collagen ooLeft foot superficial wound looks as though it is improving ooRight dorsal foot area is just about healed. 08/21/2018; patient is completed his antibiotics for his UTI. ooHe has 2 open areas on the buttocks. There is still not closed although the surface looks satisfactory. Using silver alginate ooLeft heel continues to improve using silver collagen ooThe bilateral dorsal foot areas which are at the base of his first and second toes/possible tinea pedis are actually stable on the left but worse on the right. The area on the left required debridement of necrotic surface. After debridement I obtained a specimen for PCR culture. ooThe right dorsal foot which is been just about healed last week is now reopened 08/28/2018; culture done on the left dorsal foot  showed coag negative staph both staph epidermidis and Lugdunensis. I think this is worthwhile initiating systemic treatment. I will use doxycycline given his long list of allergies. The area on the left heel slightly improved but still requiring debridement. ooThe large wound on the buttock is just about closed whereas the smaller one is larger. Using silver alginate in this area 09/04/2018; patient is completing his doxycycline for the left foot although this continues to be a very difficult wound area with very adherent necrotic debris. We are using silver alginate to all his wounds right foot left foot and the small wounds on his buttock, silver collagen on the left heel. 09/11/2018; once again this patient has intense erythema and swelling of the left forefoot. Lesser degrees of erythema in the right foot. He has a long list of allergies and intolerances. I will reinstitute doxycycline. 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 Using silver alginate ooLeft heel also looks better using silver collagen ooUnfortunately both the areas on his feet look worse. The area on the left first second webspace is now gone through to the plantar part of his foot. The area on the left foot anteriorly is irritated with erythema and swelling in the forefoot. 09/25/2018 ooHis wound on the left plantar heel looks better. Using silver collagen ooThe area on the left buttock 2 small remnant areas. One is closed one is still open. Using silver alginate ooThe areas between  both his first and second toes look worse. This in spite of long-standing antifungal therapy with ketoconazole and silver alginate which should have antifungal activity ooHe has small areas around his original wound on the left calf one is on the bottom of the original scar tissue and one superiorly both of these are small and superficial but again given wound history in this site this is  worrisome 10/02/2018 ooLeft plantar heel continues to gradually contract using silver collagen ooLeft buttock wound is unchanged using silver alginate ooThe areas on his dorsal feet between his first and second toes bilaterally look about the same. 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 Small open area here. We have been using silver alginate 10/09/2018; ooLeft plantar heel continues to look healthy and contract. No debridement is required ooLeft buttock slightly smaller with a tape injury wound just below which was new this week ooDorsal feet somewhat improved I have been using clindamycin ooLeft lateral looks lower extremity the actual open area looks worse although a lot of this is epithelialized. I am going to change to silver collagen today He has a lot more swelling in the right leg although this is not pitting not red and not particularly warm there is a lot of spasm in the right leg usually indicative of people with paralysis of some underlying discomfort. We have reviewed his vascular status from 2017 he had a left greater saphenous vein ablation. I wonder about referring him back to vascular surgery if the area on the left leg continues to deteriorate. 10/16/2018 in today for follow-up and management of multiple lower extremity ulcers. His left Buttock wound is much lower smaller and almost closed completely. The wound to the left ankle has began to reopen with Epithelialization and some adherent slough. He has multiple new areas to the left foot and leg. The left dorsal foot without much improvement. Wound present between left great webspace and 2nd toe. Erythema and edema present right leg. Right LE ultrasound obtained on 10/10/18 was negative for DVT . 10/23/2018; ooLeft buttock is closed over.  Still dry macerated skin but there is no open wound. I suspect this is chronic pressure/moisture ooLeft lateral calf is quite a bit worse than when I saw this last. There is clearly drainage here he has macerated skin into the left plantar heel. We will change the primary dressing to alginate ooLeft dorsal foot has some improvement in overall wound area. Still using clindamycin and silver alginate ooRight dorsal foot about the same as the left using clindamycin and silver alginate ooThe erythema in the right leg has resolved. He is DVT rule out was negative ooLeft heel pressure area required debridement although the wound is smaller and the surface is health 10/26/2018 ooThe patient came back in for his nurse check today predominantly because of the drainage coming out of the left lateral leg with a recent reopening of his original wound on the left lateral calf. He comes in today with a large amount of surrounding erythema around the wound extending from the calf into the ankle and even in the area on the dorsal foot. He is not systemically unwell. He is not febrile. Nevertheless this looks like cellulitis. We have been using silver alginate to the area. I changed him to a regular visit and I am going to prescribe him doxycycline.  The rationale here is a long list of medication intolerances and a history of MRSA. I did not see anything that I thought would provide a valuable culture 10/30/2018 ooFollow-up from his appointment 4 days ago with really an extensive area of cellulitis in the left calf left lateral ankle and left dorsal foot. I put him on doxycycline. He has a long list of medication allergies which are true allergy reactions. 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 In any case he is a lot better today. The erythema especially in the anterior and lateral left calf is better. He still has left ankle erythema. He  also is complaining about increasing edema in the right leg we have only been using Kerlix Coban and he has been doing the wraps at home. Finally he has a spotty rash on the medial part of his upper left calf which looks like folliculitis or perhaps wrap occlusion type injury. Small superficial macules not pustules 11/06/18 patient arrives today with again a considerable degree of erythema around the wound on the left lateral calf extending into the dorsal ankle and dorsal foot. This is a lot worse than when I saw this last week. He is on doxycycline really with not a lot of improvement. He has not been systemically unwell Wounds on the; left heel actually looks improved. Original area on the left foot and proximity to the first and second toes looks about the same. He has superficial areas on the dorsal foot, anterior calf and then the reopening of his original wound on the left lateral calf which looks about the same ooThe only area he has on the right is the dorsal webspace first and second which is smaller. ooHe has a large area of dry erythematous skin on the left buttock small open area here. 11/13/2018; the patient arrives in much better condition. The erythema around the wound on the left lateral calf is a lot better. Not sure whether this was the clindamycin or the TCA and ketoconazole or just in the improvement in edema control [stasis dermatitis]. In any case this is a lot better. The area on the left heel is very small and just about resolved using silver collagen we have been using silver alginate to the areas on his dorsal feet 11/20/2018; his wounds include the left lateral calf, left heel, dorsal aspects of both feet just proximal to the first second webspace. He is stable to slightly improved. I did not think any changes to his dressings were going to be necessary 11/27/2018 he has a reopening on the left buttock which is surrounded by what looks like tinea or perhaps some other form of  dermatitis. The area on the left dorsal foot has some erythema around it I have marked this area but I am not sure whether this is cellulitis or not. Left heel is not closed. Left calf the reopening is really slightly longer and probably worse 1/13; in general things look better and smaller except for the left dorsal foot. Area on the left heel is just about closed, left buttock looks better only a small wound remains in the skin looks better [using Lotrisone] 1/20; the area on the left heel only has a few remaining open areas here. Left lateral calf about the same in terms of size, left dorsal foot slightly larger right lateral foot still not closed. The area on the left buttock has no open wound and the surrounding  skin looks a lot better 1/27; the area on the left heel is closed. Left lateral calf better but still requiring extensive debridements. The area on his left buttock is closed. He still has the open areas on the left dorsal foot which is slightly smaller in the right foot which is slightly expanded. We have been using Iodoflex on these areas as well 2/3; left heel is closed. Left lateral calf still requiring debridement using Iodoflex there is no open area on his left buttock however he has dry scaly skin over a large area of this. Not really responding well to the Lotrisone. Finally the areas on his dorsal feet at the level of the first second webspace are slightly smaller on the right and about the same on the left. Both of these vigorously debrided with Anasept and gauze 2/10; left heel remains closed he has dry erythematous skin over the left buttock but there is no open wound here. Left lateral leg has come in and with. Still requiring debridement we have been using Iodoflex here. Finally the area on the left dorsal foot and right dorsal foot are really about the same extremely dry callused fissured areas. He does not yet have a dermatology appointment 2/17; left heel remains closed.  He has a new open area on the left buttock. The area on the left lateral calf is bigger longer and still covered in necrotic debris. No major change in his foot areas bilaterally. I am awaiting for a dermatologist to look on this. We have been using ketoconazole I do not know that this is been doing any good at all. 2/24; left heel remains closed. The left buttock wound that was new reopening last week looks better. The left lateral calf appears better also although still requires debridement. The major area on his foot is the left first second also requiring debridement. We have been putting Prisma on all wounds. I do not believe that the ketoconazole has done too much good for his feet. He will use Lotrisone I am going to give him a 2-week course of terbinafine. We still do not have a dermatology appointment 3/2 left heel remains closed however there is skin over bone in this area I pointed this out to him today. The left buttock wound is epithelialized but still does not look completely stable. The area on the left leg required debridement were using silver collagen here. With regards to his feet we changed to Lotrisone last week and silver alginate. 3/9; left heel remains closed. Left buttock remains closed. The area on the right foot is essentially closed. The left foot remains unchanged. Slightly smaller on the left lateral calf. Using silver collagen to both of these areas 3/16-Left heel remains closed. Area on right foot is closed. Left lateral calf above the lateral malleolus open wound requiring debridement with easy bleeding. Left dorsal wound proximal to first toe also debrided. Left ischial area open new. Patient has been using Prisma with wrapping every 3 days. Dermatology appointment is apparently tomorrow.Patient has completed his terbinafine 2-week course with some apparent improvement according to him, there is still flaking and dry skin in his foot on the left 3/23; area on the  right foot is reopened. The area on the left anterior foot is about the same still a very necrotic adherent surface. He still has the area on the left leg and reopening is on the left buttock. He apparently saw dermatology although I do not have a note. According to the  patient who is usually fairly well informed they did not have any good ideas. Put him on oral terbinafine which she is been on before. 3/30; using silver collagen to all wounds. Apparently his dermatologist put him on doxycycline and rifampin presumably some culture grew staph. I do not have this result. He remains on terbinafine although I have used terbinafine on him before 4/6; patient has had a fairly substantial reopening on the right foot between the first and second toes. He is finished his terbinafine and I believe is on doxycycline and rifampin still as prescribed by dermatology. We have been using silver collagen to all his wounds although the patient reports that he thinks silver alginate does better on the wounds on his buttock. 4/13; the area on his left lateral calf about the same size but it did not require debridement. ooLeft dorsal foot just proximal to the webspace between the first and second toes is about the same. Still nonviable surface. I note some superficial bronze discoloration of the dorsal part of his foot ooRight dorsal foot just proximal to the first and second toes also looks about the same. I still think there may be the same discoloration I noted above on the left ooLeft buttock wound looks about the same 4/20; left lateral calf appears to be gradually contracting using silver collagen. 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 The left dorsal foot wound is debrided of tightly adherent necrotic debris and really cleans up quite nicely. The right area is worse with expansion. I did not debride this it is now over the base of the second  toe ooThe area on his left buttock is smaller no debridement is required using silver collagen 5/4; left calf continues to make good progress. ooHe arrives with erythema around the wounds on his dorsal foot which even extends to the plantar aspect. Very concerning for coexistent infection. He is finished the erythromycin I gave him for possible erythrasma this does not seem to have helped. ooThe area on the left foot is about the same base of the dorsal toes ooIs area on the buttock looks improved on the left 5/11; left calf and left buttock continued to make good progress. Left foot is about the same to slightly improved. ooMajor problem is on the right foot. He has not had an x-ray. Deep tissue culture I did last week showed both Enterobacter and E. coli. I did not change the doxycycline I put him on empirically although neither 1 of these were plated to doxycycline. He arrives today with the erythema looking worse on both the dorsal and plantar foot. Macerated skin on the bottom of the foot. he has not been systemically unwell 5/18-Patient returns at 1 week, left calf wound appears to be making some progress, left buttock wound appears slightly worse than last time, left foot wound looks slightly better, right foot redness is marginally better. X-ray of both feet show no air or evidence of osteomyelitis. Patient is finished his Omnicef and terbinafine. He continues to have macerated skin on the bottom of the left foot as well as right 5/26; left calf wound is better, left buttock wound appears to have multiple small superficial open areas with surrounding macerated skin. X-rays that I did last time showed no evidence of osteomyelitis in either foot. He is finished cefdinir and doxycycline. I do not think that he was on terbinafine. He continues to have a large superficial open area on the right foot  anterior dorsal and slightly between the first and second toes. I did send him to dermatology  2 months ago or so wondering about whether they would do a fungal scraping. I do not believe they did but did do a culture. We have been using silver alginate to the toe areas, he has been using antifungals at home topically either ketoconazole or Lotrisone. We are using silver collagen on the left foot, silver alginate on the right, silver collagen on the left lateral leg and silver alginate on the left buttock 6/1; left buttock area is healed. We have the left dorsal foot, left lateral leg and right dorsal foot. We are using silver alginate to the areas on both feet and silver collagen to the area on his left lateral calf 6/8; the left buttock apparently reopened late last week. He is not really sure how this happened. He is tolerating the terbinafine. Using silver alginate to all wounds 6/15; left buttock wound is larger than last week but still superficial. ooCame in the clinic today with a report of purulence from the left lateral leg I did not identify any infection ooBoth areas on his dorsal feet appear to be better. He is tolerating the terbinafine. Using silver alginate to all wounds 6/22; left buttock is about the same this week, left calf quite a bit better. His left foot is about the same however he comes in with erythema and warmth in the right forefoot once again. Culture that I gave him in the beginning of May showed Enterobacter and E. coli. I gave him doxycycline and things seem to improve although neither 1 of these organisms was specifically plated. 6/29; left buttock is larger and dry this week. Left lateral calf looks to me to be improved. Left dorsal foot also somewhat improved right foot completely unchanged. The erythema on the right foot is still present. He is completing the Ceftin dinner that I gave him empirically [see discussion above.) 7/6 - All wounds look to be stable and perhaps improved, the left buttock wound is slightly smaller, per patient bleeds easily,  completed ceftin, the right foot redness is less, he is on terbinafine 7/13; left buttock wound about the same perhaps slightly narrower. Area on the left lateral leg continues to narrow. Left dorsal foot slightly smaller right foot about the same. We are using silver alginate on the right foot and Hydrofera Blue to the areas on the left. Unna boot on the left 2 layer compression on the right 7/20; left buttock wound absolutely the same. Area on lateral leg continues to get better. Left dorsal foot require debridement as did the right no major change in the 7/27; left buttock wound the same size necrotic debris over the surface. The area on the lateral leg is closed once again. His left foot looks better right foot about the same although there is some involvement now of the posterior first second toe area. He is still on terbinafine which I have given him for a month, not certain a centimeter major change 06/25/19-All wounds appear to be slightly improved according to report, left buttock wound looks clean, both foot wounds have minimal to no debris the right dorsal foot has minimal slough. We are using Hydrofera Blue to the left and silver alginate to the right foot and ischial wound. 8/10-Wounds all appear to be around the same, the right forefoot distal part has some redness which was not there before, however the wound looks clean and small. Ischial wound looks  about the same with no changes 8/17; his wound on the left lateral calf which was his original chronic venous insufficiency wound remains closed. Since I last saw him the areas on the left dorsal foot right dorsal foot generally appear better but require debridement. The area on his left initial tuberosity appears somewhat larger to me perhaps hyper granulated and bleeds very easily. We have been using Hydrofera Blue to the left dorsal foot and silver alginate to everything else 8/24; left lateral calf remains closed. The areas on his  dorsal feet on the webspace of the first and second toes bilaterally both look better. The area on the left buttock which is the pressure ulcer stage II slightly smaller. I change the dressing to Hydrofera Blue to all areas 8/31; left lateral calf remains closed. The area on his dorsal feet bilaterally look better. Using Hydrofera Blue. Still requiring debridement on the left foot. No change in the left buttock pressure ulcers however 9/14; left lateral calf remains closed. Dorsal feet look quite a bit better than 2 weeks ago. Flaking dry skin also a lot better with the ammonium lactate I gave him 2 weeks ago. The area on the left buttock is improved. He states that his Roho cushion developed a leak and he is getting a new one, in the interim he is offloading this vigorously 9/21; left calf remains closed. Left heel which was a possible DTI looks better this week. He had macerated tissue around the left dorsal foot right foot looks satisfactory and improved left buttock wound. I changed his dressings to his feet to silver alginate bilaterally. Continuing Hydrofera Blue on the left buttock. 9/28 left calf remains closed. Left heel did not develop anything [possible DTI] dry flaking skin on the left dorsal foot. Right foot looks satisfactory. Improved left buttock wound. We are using silver alginate on his feet Hydrofera Blue on the buttock. I have asked him to go back to the Lotrisone on his feet including the wounds and surrounding areas 10/5; left calf remains closed. The areas on the left and right feet about the same. A lot of this is epithelialized however debris over the remaining open areas. He is using Lotrisone and silver alginate. The area on the left buttock using Hydrofera Blue 10/26. Patient has been out for 3 weeks secondary to Covid concerns. He tested negative but I think his wife tested positive. He comes in today with the left foot substantially worse, right foot about the same. Even  more concerning he states that the area on his left buttock closed over but then reopened and is considerably deeper in one aspect than it was before [stage III wound] 11/2; left foot really about the same as last week. Quarter sized wound on the dorsal foot just proximal to the first second toes. Surrounding erythema with areas of denuded epithelium. This is not really much different looking. Did not look like cellulitis this time however. ooRight foot area about the same.. We have been using silver alginate alginate on his toes ooLeft buttock still substantial irritated skin around the wound which I think looks somewhat better. We have been using Hydrofera Blue here. 11/9; left foot larger than last week and a very necrotic surface. Right foot I think is about the same perhaps slightly smaller. Debris around the circumference also addressed. Unfortunately on the left buttock there is been a decline. Satellite lesions below the major wound distally and now a an additional one posteriorly we have been using  Hydrofera Blue but I think this is a pressure issue 11/16; left foot ulcer dorsally again a very adherent necrotic surface. Right foot is about the same. Not much change in the pressure ulcer on his left buttock. 11/30; left foot ulcer dorsally basically the same as when I saw him 2 weeks ago. Very adherent fibrinous debris on the wound surface. Patient reports a lot of drainage as well. 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 We have been using Iodoflex, patient changed back to alginate because of the drainage. Area on his right dorsal foot really looks benign with a healthier surface certainly a lot better than on the left. Left buttock wounds all improved using Hydrofera Blue 12/7; left dorsal foot again no improvement. Tightly adherent debris. PCR culture I did last week only showed likely skin contaminant. I have gone ahead and done a punch  biopsy of this which is about the last thing in terms of investigations I can think to do. He has known venous insufficiency and venous hypertension and this could be the issue here. The area on the right foot is about the same left buttock slightly worse according to our intake nurse secondary to [facility] Blue sticking to the wound 12/14; biopsy of the left foot that I did last time showed changes that could be related to wound healing/chronic stasis dermatitis phenomenon no neoplasm. We have been using silver alginate to both feet. I change the one on the left today to Sorbact and silver alginate to his other 2 wounds 12/28; the patient arrives with the following problems; ooMajor issue is the dorsal left foot which continues to be a larger deeper wound area. Still with a completely nonviable surface ooParadoxically the area mirror image on the right on the right dorsal foot appears to be getting better. ooHe had some loss of dry denuded skin from the lower part of his original wound on the left lateral calf. Some of this area looked a little vulnerable and for this reason we put him in wrap that on this side this week ooThe area on his left buttock is larger. He still has the erythematous circular area which I think is a combination of pressure, sweat. This does not look like cellulitis or fungal dermatitis 11/26/2019; -Dorsal left foot large open wound with depth. Still debris over the surface. Using Sorbact ooThe area on the dorsal right foot paradoxically has closed over [facility] has a reopening on the left ankle laterally at the base of his original wound that extended up into the calf. This appears clean. ooThe left buttock wound is smaller but with very adherent necrotic debris over the surface. We have been using silver alginate here as well The patient had arterial studies done in 2017. He had biphasic waveforms at the dorsalis pedis and posterior tibial bilaterally. ABI in the left was  1.17. Digit waveforms were dampened. He has slight spasticity in the great toes I do not think a TBI would be possible 1/11; the patient comes in today with a sizable reopening between the first and second toes on the right. This is not exactly in the same location where we have been treating wounds previously. According to our intake nurse this was actually fairly deep but 0.6 cm. The area on the left dorsal foot looks about the same the surface is somewhat cleaner using Sorbact, his MRI is in 2 days. We have not managed yet to get arterial studies. The new  reopening on the left lateral calf looks somewhat better using alginate. The left buttock wound is about the same using alginate 1/18; the patient had his ARTERIAL studies which were quite normal. ABI in the right at 1.13 with triphasic/biphasic waveforms on the left ABI 1.06 again with triphasic/biphasic waveforms. It would not have been possible to have done a toe brachial index because of spasticity. We have been using Sorbac to the left foot alginate to the rest of his wounds on the right foot left lateral calf and left buttock 1/25; arrives in clinic with erythema and swelling of the left forefoot worse over the first MTP area. This extends laterally dorsally and but also posteriorly. Still has an area on the left lateral part of the lower part of his calf wound it is eschared and clearly not closed. ooArea on the left buttock still with surrounding irritation and erythema. ooRight foot surface wound dorsally. The area between the right and first and second toes appears better. 2/1; ooThe left foot wound is about the same. Erythema slightly better I gave him a week of doxycycline empirically ooRight foot wound is more extensive extending between the toes to the plantar surface ooLeft lateral calf really no open surface on the inferior part of his original wound however the entire area still looks vulnerable ooAbsolutely no improvement  in the left buttock wound required debridement. 2/8; the left foot is about the same. Erythema is slightly improved I gave him clindamycin last week. ooRight foot looks better he is using Lotrimin and silver alginate ooHe has a breakdown in the left lateral calf. Denuded epithelium which I have removed ooLeft buttock about the same were using Hydrofera Blue 2/15; left foot is about the same there is less surrounding erythema. Surface still has tightly adherent debris which I have debriding however not making any progress ooRight foot has a substantial wound on the medial right second toe between the first and second webspace. ooStill an open area on the left lateral calf distal area. ooButtock wound is about the same 2/22; left foot is about the same less surrounding erythema. Surface has adherent debris. Polymen Ag Right foot area significant wound between the first and second toes. We have been using silver alginate here Left lateral leg polymen Ag at the base of his original venous insufficiency wound ooLeft buttock some improvement here 3/1; ooRight foot is deteriorating in the first second toe webspace. Larger and more substantial. We have been using silver alginate. ooLeft dorsal foot about the same markedly adherent surface debris using PolyMem Ag ooLeft lateral calf surface debris using PolyMem AG ooLeft buttock is improved again using PolyMem Ag. ooHe is completing his terbinafine. The erythema in the foot seems better. He has been on this for 2 weeks 3/8; no improvement in any wound area in fact he has a small open area on the dorsal midfoot which is new this week. He has not gotten his foot x-rays yet 3/15; his x-rays were both negative for osteomyelitis of both feet. No major change in any of his wounds on the extremities however his buttock wounds are better. We have been using polymen on the buttocks, left lower leg. Iodoflex on the left foot and silver alginate on the  right 3/22; arrives in clinic today with the 2 major issues are the improvement in the left dorsal foot wound which for once actually looks healthy with a nice healthy wound surface without debridement. Using Iodoflex here. Unfortunately on the left lateral calf  which is in the distal part of his original wound he came to the clinic here for there was purulent drainage noted some increased breakdown scattered around the original area and a small area proximally. We we are using polymen here will change to silver alginate today. His buttock wound on the left is better and I think the area on the right first second toe webspace is also improved 3/29; left dorsal foot looks better. Using Iodoflex. Left ankle culture from deterioration last time grew E. coli, Enterobacter and Enterococcus. I will give him a course of cefdinir although that will not cover Enterococcus. The area on the right foot in the webspace of the first and second toe lateral first toe looks better. The area on his buttock is about healed Vascular appointment is on April 21. This is to look at his venous system vis--vis continued breakdown of the wounds on the left including the left lateral leg and left dorsal foot he. He has had previous ablations on this side 4/5; the area between the right first and second toes lateral aspect of the first toe looks better. Dorsal aspect of the left first toe on the left foot also improved. Unfortunately the left lateral lower leg is larger and there is a second satellite wound superiorly. The usual superficial abrasions on the left buttock overall better but certainly not closed 4/12; the area between the right first and second toes is improved. Dorsal aspect of the left foot also slightly smaller with a vibrant healthy looking surface. No real change in the left lateral leg and the left buttock wound is healed He has an unaffordable co-pay for Apligraf. Appointment with vein and vascular with  regards to the left leg venous part of the circulation is on 4/21 4/19; we continue to see improvement in all wound areas. Although this is minor. He has his vascular appointment on 4/21. The area on the left buttock has not reopened although right in the center of this area the skin looks somewhat threatened 4/26; the left buttock is unfortunately reopened. In general his left dorsal foot has a healthy surface and looks somewhat smaller although it was not measured as such. The area between his first and second toe webspace on the right as a small wound against the first toe. The patient saw vascular surgery. The real question I was asking was about the small saphenous vein on the left. He has previously ablated left greater saphenous vein. Nothing further was commented on on the left. Right greater saphenous vein without reflux at the saphenofemoral junction or proximal thigh there was no indication for ablation of the right greater saphenous vein duplex was negative for DVT bilaterally. They did not think there was anything from a vascular surgery point of view that could be offered. They ABIs within normal limits 5/3; only small open area on the left buttock. The area on the left lateral leg which was his original venous reflux is now 2 wounds both which look clean. We are using Iodoflex on the left dorsal foot which looks healthy and smaller. He is down to a very tiny area between the right first and second toes, using silver alginate 5/10; all of his wounds appear better. We have much better edema control in 4 layer compression on the left. This may be the factor that is allowing the left foot and left lateral calf to heal. He has external compression garments at home 04/14/20-All of his wounds are progressing well, the left  forefoot is practically closed, left ischium appears to be about the same, right toe webspace is also smaller. The left lateral leg is about the same, continue using  Hydrofera Blue to this, silver alginate to the ischium, Iodoflex to the toe space on the right 6/7; most of his wounds outside of the left buttock are doing well. The area on the left lateral calf and left dorsal foot are smaller. The area on the right foot in between the first and second toe webspace is barely visible although he still says there is some drainage here is the only reason I did not heal this out. ooUnfortunately the area on the left buttock almost looks like he has a skin tear from tape. He has open wound and then a large flap of skin that we are trying to get adherence over an area just next to the remaining wound 6/21; 2 week follow-up. I believe is been here for nurse visits. Miraculously the area between his first and second toes on the left dorsal foot is closed over. Still open on the right first second web space. The left lateral calf has 2 open areas. Distally this is more superficial. The proximal area had a little more depth and required debridement of adherent necrotic material. His buttock wound is actually larger we have been using silver alginate here 6/28; the patient's area on the left foot remains closed. Still open wet area between the first and second toes on the right and also extending into the plantar aspect. We have been using silver alginate in this location. He has 2 areas on the left lower leg part of his original long wounds which I think are better. We have been using Hydrofera Blue here. Hydrofera Blue to the left buttock which is stable 7/12; left foot remains closed. Left ankle is closed. May be a small area between his right first and second toes the only truly open area is on the left buttock. We have been using Hydrofera Blue here 7/19; patient arrives with marked deterioration especially in the left foot and ankle. We did not put him in a compression wrap on the left last week in fact he wore his juxta lite stockings on either side although he does  not have an underlying stocking. He has a reopening on the left dorsal foot, left lateral ankle and a new area on the right dorsal ankle. More worrisome is the degree of erythema on the left foot extending on the lateral foot into the lateral lower leg on the left 7/26; the patient had erythema and drainage from the lateral left ankle last week. Culture of this grew MRSA resistant to doxycycline and clindamycin which are the 2 antibiotics we usually use with this patient who has multiple antibiotic allergies including linezolid, trimethoprim sulfamethoxazole. I had give him an empiric doxycycline and he comes in the area certainly looks somewhat better although it is blotchy in his lower leg. He has not been systemically unwell. He has had areas on the left dorsal foot which is a reopening, chronic wounds on the left lateral ankle. Both of these I think are secondary to chronic venous insufficiency. The area between his first and second toes is closed as far as I can tell. He had a new wrap injury on the right dorsal ankle last week. Finally he has an area on the left buttock. We have been using silver alginate to everything except the left buttock we are using Hydrofera Blue 06/30/20-Patient returns at  1 week, has been given a sample dose pack of NUZYRA which is a tetracycline derivative [omadacycline], patient has completed those, we have been using silver alginate to almost all the wounds except the left ischium where we are using Hydrofera Blue all of them look better 8/16; since I last saw the patient he has been doing well. The area on the left buttock, left lateral ankle and left foot are all closed today. He has completed the [place] I gave him last time and tolerated this well. He still has open areas on the right dorsal ankle and in the right first second toe area which we are using silver alginate. 8/23; we put him in his bilateral external compression stockings last week as he did not have  anything open on either leg except for concerning area between the right first and second toe. He comes in today with an area on the left dorsal foot slightly more proximal than the original wound, the left lateral foot but this is actually a continuation of the area he had on the left lateral ankle from last time. As well he is opened up on the left buttock again. 8/30; comes in today with things looking a lot better. The area on the left lower ankle has closed down as has the left foot but with eschar in both areas. The area on the dorsal right ankle is also epithelialized. Very little remaining of the left buttock wound. We have been using silver alginate on all wound areas 9/13; the area in the first second toe webspace on the right has fully epithelialized. He still has some vulnerable epithelium on the right and the ankle and the dorsal foot. He notes weeping. He is using his juxta lite stocking. On the left again the left dorsal foot is closed left lateral ankle is closed. We went to the juxta lite stocking here as well. ooStill vulnerable in the left buttock although only 2 small open areas remain here 9/27; 2-week follow-up. We did not look at his left leg but the patient says everything is closed. He is a bit disturbed by the amount of edema in his left foot he is using juxta lite stockings but asking about over the toes stockings which would be 30/40, will talk to him next time. According to him there is no open wound on either the left foot or the left ankle/calf He has an open area on the dorsal right calf which I initially point a wrap injury. He has superficial remaining wound on the left ischial tuberosity been using silver alginate although he says this sticks to the wound 10/5; we gave him 2-week follow-up but he called yesterday expressing some concerns about his right foot right ankle and the left buttock. He came in early. There is still no open areas on the left leg and that  still in his juxta lite stocking 10/11; he only has 1 small area on the left buttock that remains measuring millimeters 1 mm. Still has the same irritated skin in this area. We recommended zinc oxide when this eventually closes and pressure relief is meticulously is he can do this. He still has an area on the dorsal part of his right first through third toes which is a bit irritated and still open and on the dorsal ankle near the crease of the ankle. We have been using silver alginate and using his own stocking. He has nothing open on the left leg or foot 10/25; 2-week follow-up. Not  nearly as good on the left buttock as I was hoping. For open areas with 5 looking threatened small. He has the erythematous irritated chronic skin in this area. oo1 area on the right dorsal ankle. He reports this area bleeds easily ooRight dorsal foot just proximal to the base of his toes ooWe have been using silver alginate. 11/8; 2-week follow-up. Left buttock is about the same although I do not think the wounds are in the same location we have been using silver alginate. I have asked him to use zinc oxide on the skin around the wounds. ooHe still has a small area on the right dorsal ankle he reports this bleeds easily ooRight dorsal foot just proximal to the base of the toes does not have anything open although the skin is very dry and scaly ooHe has a new opening on the nailbed of the left great toe. Nothing on the left ankle 11/29; 3-week follow-up. Left buttock has 2 open areas. And washing of these wounds today started bleeding easily. Suggesting very friable tissue. We have been using silver alginate. Right dorsal ankle which I thought was initially a wrap injury we have been using silver alginate. Nothing open between the toes that I can see. He states the area on the left dorsal toe nailbed healed after the last visit in 2 or 3 days 12/13; 3-week follow-up. His left buttock now has 3 open areas but the  original 2 areas are smaller using polymen here. Surrounding skin looks better. The right dorsal ankle is closed. He has a small opening on the right dorsal foot at the level of the third toe. In general the skin looks better here. He is wearing his juxta lite stocking on the left leg says there is nothing open 11/24/2020; 3 weeks follow-up. His left buttock still has the 3 open areas. We have been using polymen but due to lack of response he changed to [facility] area. Surrounding skin is dry erythematous and irritated looking. There is no evidence of infection either bacterial or fungal however there is loss of surface epithelium ooHe still has very dry skin in his foot causing irritation and erythema on the dorsal part of his toes. This is not responded to prolonged courses of antifungal simply looks dry and irritated 1/24; left buttock area still looks about the same he was unable to find the triad ointment that we had suggested. The area on the right lower leg just above the dorsal ankle has reopened and the areas on the right foot between the first second and second third toes and scaling on the bottom of the foot has been about the same for quite some time now. been using silver alginate to all wound areas 2/7; left buttock wound looked quite good although not much smaller in terms of surface area surrounding skin looks better. Only a few dry flaking areas on the right foot in between the first and second toes the skin generally looks better here [ammonium lactate]. Finally the area on the right dorsal ankle is closed 2/21; ooThere is no open area on the right foot even between the right first and second toe. Skin around this area dorsally and plantar aspects look better. ooHe has a reopening of the area on the right ankle just above the crease of the ankle dorsally. I continue to think that this is probably friction from spasms may be even this time with his stocking under the  compression stockings. ooWounds on his left buttock  look about the same there a couple of areas that have reopened. He has a total square area of loss of epithelialization. This does not look like infection it looks like a contact dermatitis but I just cannot determine to what 3/14; there is nothing on the right foot between the first and second toes this was carefully inspected under illumination. Some chronic irritation on the dorsal part of his foot from toes 1-3 at the base. Nothing really open here substantially. Still has an area on the right foot/ankle that is actually larger and hyper granulated. His buttock area on the left is just about closed however he has chronic inflammation with loss of the surface epithelial layer 3/28; 2-week follow-up. In clinic today with a new wound on the left anterior mid tibia. Says this happened about 2 weeks ago. He is not really sure how wonders about the spasticity of his legs at night whether that could have caused this other than that he does not have a good idea. He has been using topical antibiotics and silver alginate. The area on his right dorsal ankle seems somewhat better. ooFinally everything on his left buttock is closed. 4/11; 2-week follow-up. All of his wounds are better except for the area over the ischium and left buttock which have opened up widely again. At least part of this is covered in necrotic fibrinous material another part had rolled nonviable skin. The area on the right ankle, left anterior mid tibia are both a lot better. He had no open wounds on either foot including the areas between the first and second toes 4/25; patient presents for 2-week follow-up. He states that the wounds are overall stable. He has no complaints today and states he is using Hydrofera Blue to open wounds. 5/9; have not seen this man in over a month. For my memory he has open areas on the left mid tibia and right ankle. T oday he has new open area on the  right dorsal foot which we have not had a problem with recently. He has the sustained area on the left buttock He is also changed his insurance at the beginning of the year [employer]. We will need prior authorizations for debridement 5/23; patient presents for 2-week follow-up. He has prior authorizations for debridement. He denies any issues in the past 2 weeks with his wound care. He has been using Hydrofera Blue to all the wounds. He does report a circular rash to the upper left leg that is new. He denies acute signs of infection. 6/6; 2-week follow-up. The patient has open wounds on the left buttock which are worse than the last time I saw this about a month ago. He also has a new area to me on the left anterior mid tibia with some surrounding erythema. The area on the dorsal ankle on the right is closed but I think this will be a friction injury every time this area is exposed to either our wraps or his compression stockings caused by unrelenting spasms in this leg. 6/20; 2-week follow-up. oo The patient has open wounds on the left buttock which is about the same. Using [facility] here. - The left mid tibia has a static amount of surrounding erythema. Also a raised area in the center. We have been using Hydrofera Blue here. ooooFinally he has broken down in his dorsal right foot extending between the first and second toes and going to the base of the first and second toe webspace. I have previously  assumed that this was severe venous hypertension 7/5; 2-week follow-up oo The left buttock wound actually looks better. We are using Hydrofera Blue. He has extensive skin irritation around this area and I have not really been able to get that any better. I have tried Lotrisone i.e. antifungals and steroids. More most recently we have just been using Coloplast really looks about the same. oo The left mid tibia which was new last week culture to have very resistant staph aureus. Not only  methicillin-resistant but doxycycline resistant. The patient has a plethora of antibiotic allergies including sulfa, linezolid. I used topical bacitracin on this but he has not started this yet. oo In addition he has an expanding area of erythema with a wound on the dorsal right foot. I did a deep tissue culture of this area today 7/12; oo Left buttock area actually looks better surrounding skin also looks less irritated. oo Left mid tibia looks about the same. He is using bacitracin this is not worse oo Right dorsal foot looks about the same as well. oo The left first toe also looks about the same 7/19; left buttock wound continues to improve in terms of open areas oo Left mid tibia is still concerning amount of swelling he is using bacitracin oo Dorsal left first toe somewhat smaller oo Right dorsal foot somewhat smaller 7/25; left buttock wound actually continues to improve oo Left mid tibia area has less swelling. I gave him all my samples of new Nuzyra. This seems to have helped although the wound is still open it. His abrasion closed by here oo Left dorsal great toe really no better. Still a very nonviable surface oo Right dorsal foot perhaps some better. oo We have been using bacitracin and silver nitrate to the areas on his lower legs and Hydrofera Blue to the area on the buttock. 8/16 oo Disappointed that his left buttock wound is actually more substantial. Apparently during the last nurse visit these were both very small. He has continued irritation to a large area of skin on his buttock. I have never been able to totally explain this although I think it some combination of the way he sits, pressure, moisture. He is not incontinent enough to contribute to this. oo Left dorsal great toe still fibrinous debris on the surface that I have debrided today oo Large area across the dorsal right toes. oo The area on the left anterior mid tibia has less swelling. He completed  the [place]. This does not look infected although the tissue is still fried 8/30; 2-week follow-up. oo Left buttock areas not improved. We used Hydrofera Blue on this. Weeping wet with the surrounding erythema that I have not been able to control even with Lotrisone and topical Coloplast oo Left dorsal great toe looks about the same oo More substantial area again at the base of his toes on the left which is new this week. oo Area across the dorsal right toes looks improved oo The left anterior mid tibia looks like it is trying to close 9/13; 2-week follow-up. Using silver alginate on all of his wounds. The left dorsal foot does not look any better. He has the area on the dorsal toe and also the areas at the base of all of his toes 1 through 3. On the right foot he has a similar pattern in a similar area. He has the area on his left mid tibia that looks fairly healthy. Finally the area on his left buttock looks somewhat bette  9/20; culture I did of the left foot which was a deep tissue culture last time showed E. coli he has erythema around this wound. Still a completely necrotic surface. His right dorsal foot looks about the same. He has a very friable surface to the left anterior mid tibia. Both buttock wounds look better. We have been using silver alginate to all wounds 10/4; he has completed the cephalexin that I gave him last time for the left foot. He is using topical gentamicin under silver alginate silver alginate being applied to all the wounds. Unfortunately all the wounds look irritated on his dorsal right foot dorsal left foot left mid tibia. I wonder if this could be a silver allergy. I am going to change him to [facility] on the lower extremity. The skin on the left buttock and left posterior thigh still flaking dry and irritated. This has continued no matter what I have applied topically to this. He has a solitary open wound which by itself does not look too bad however the  entire area of surrounding skin does not change no matter what we have applied here 10/18; the area on the left dorsal foot and right dorsal foot both look better. The area on the right extends into the plantar but not between his toes. We have been using silver alginate. He still has a rectangular erythematous area around the area on the left tibia. The wound itself is very small. Finally everything on his left buttock looks a little larger the skin is erythematous 11/15; patient comes in with the left dorsal and right dorsal foot distally looking somewhat better. Still nonviable surface on the left foot which required debridement. He still has the area on the left anterior mid tibia although this looks somewhat better. He has a new area on the right lateral lower leg just above the ankle. Finally his left buttock looks terrible with multiple superficial open wounds geometric square shaped area of chronic erythema which I have not been able to sort through 11/29; right dorsal foot and left dorsal foot both look somewhat better. No debridement required. He has the fragile area on the left anterior mid tibia this looks and continues to look somewhat better. Right lateral lower leg just above the ankle we identified last time also looks better. In general the area on his left buttock looks improved. We are using Hydrofera Blue to all wound areas 12/13; right dorsal foot looks better. The area on the right lateral leg is healed. Left dorsal foot has 2 open areas both of which require debridement. The fragile area on the left anterior mid tibial looks better. Smaller area on his buttocks. Were using Hydrofera Blue 1/10; patient comes in with everything looking slightly larger and/or worse. This includes his left buttock, reopening of the left mid tibia, larger areas on the left dorsal foot and what looks to be a cellulitis on the right dorsal and plantar foot. We have been using Hydrofera Blue on all  wounds. 1/17; right dorsal foot distally looks better today. The left foot has 2 open wounds that are about the same surrounding erythema. 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 No malignancy they did not stain for fungus 1/24; his right distal foot is not closed dry and scaly but the wound looks like it is contracting. I did not debride anything here. Problem on his left dorsal  foot with expanding erythema. Apparently there were problems last week getting the Elesa Hacker however it is now available at the [facility] but a week later. He is using ketoconazole and Coloplast to the left buttock along with Hydrofera Blue this actually looks quite a bit better today. 1/31; right dorsal foot again is dry and scaly but looking to contract. He has been using a moisturizer on his feet at my request but he is not sure which 1. The left dorsal foot wounds look about the same there is erythema here that I marked last week however after course of Nuzyra it certainly is not any better but not any worse either. Finally on his left buttock the skin continues to look better he has the original wound but a new substantial area towards the gluteal cleft. Almost like a skin tear. I used scissors to remove skin and subcutaneous tissue here silver nitrate and direct pressure 2/7; right dorsal foot. This does not look too much different from last week. Some erythema skin dry and scaly. No debridement. Left dorsal great toe again still not much improvement. I did remove flaking dry skin and callus from around the edge. Finally on his left buttock. The skin is somewhat better in the periwound. Surface wounds are superficial somewhat better than last week. 01/26/2022: Is a little bit of a mystery as to why his wounds fail to respond to treatments and actually seem to get worse. This is  my first encounter with this patient. He was previously followed by Dr. Dellia Nims. Based upon my review of the chart, it seems that there is a little bit of a mystery as to why his wounds do not respond as anticipated to the interventions applied and sometimes even get worse. Biopsies have been performed and he was seen by dermatology in [place], but that did not shed any light on the matter. T oday, his gluteal wound is larger, with substantial drainage, rather malodorous. The food wounds are not terrible, but he has a lot of callus and scaly skin around these. He is currently getting silver alginate on the gluteal wound, with idodoflex to the feet. He is using lotrisone on his legs for the dry, scaly skin. 02/09/2022: There has really been no change to any of his wounds. The gluteal wound less drainage and odor, but remains about the same size, the periwound skin remains oddly scaly. His lower extremity wounds also appear roughly the same size. They continue to accumulate a small amount of slough. The periwound on his feet and ankle wounds has dry eschar and loose dead skin. We have been using silver alginate on the gluteal wound and Iodoflex on his feet and ankle wounds. T the periwound around his gluteal lesion and Lotrisone on his feet and legs. o 02/23/2022: The right plantar foot wound is closed. The gluteal site looks small but has continued to produce hypertrophic granulation tissue. The foot wounds all look about the same on the dorsal surface of the right foot; on the left, there is only a small open area at the site of where his left great toenail would have been. 03/16/2022: The right ankle wound is healed. The right dorsal foot wound is about the same. The left dorsal foot wound is quite a bit smaller and the ischial wound is nearly closed. 03/30/2022: The right ankle wound reopened. Both dorsal foot wounds are quite a bit smaller. Unfortunately, he appears to have sheared part of his ischial  wound open further,  perhaps during a transfer. 04/13/2022: The right ankle wound has hypertrophic granulation tissue present. The dorsal foot wounds continue to decrease in size. The ischial wound looks about the same today, no better, no worse. Patient History Information obtained from Patient. Family History Diabetes - Father, Hypertension - Mother, No family history of Cancer, Heart Disease, Hereditary Spherocytosis, Kidney Disease, Lung Disease, Stroke, Thyroid Problems, Tuberculosis. Social History Former smoker, Marital Status - Married, Alcohol Use - Moderate, Drug Use - No History, Caffeine Use - Daily. Medical History Ear/Nose/Mouth/Throat Denies history of Chronic sinus problems/congestion, Middle ear problems Hematologic/Lymphatic Denies history of Anemia, Hemophilia, Human Immunodeficiency Virus, Lymphedema, Sickle Cell Disease Respiratory Patient has history of Sleep Apnea - does not tolerate cpap Denies history of Aspiration, Asthma, Chronic Obstructive Pulmonary Disease (COPD), Pneumothorax, Tuberculosis Cardiovascular Patient has history of Hypertension - lisinopril HCTZ Denies history of Angina, Arrhythmia, Congestive Heart Failure, Coronary Artery Disease, Deep Vein Thrombosis, Hypotension, Myocardial Infarction, Peripheral Arterial Disease, Peripheral Venous Disease, Phlebitis, Vasculitis Gastrointestinal Denies history of Cirrhosis , Colitis, Crohnoos, Hepatitis A, Hepatitis B, Hepatitis C Endocrine Denies history of Type I Diabetes, Type II Diabetes Genitourinary Denies history of End Stage Renal Disease Immunological Denies history of Lupus Erythematosus, Raynaudoos, Scleroderma Integumentary (Skin) Denies history of History of Burn Musculoskeletal Denies history of Gout, Rheumatoid Arthritis, Osteoarthritis, Osteomyelitis Neurologic Patient has history of Paraplegia Denies history of Dementia, Neuropathy, Quadriplegia, Seizure Disorder Oncologic Denies  history of Received Chemotherapy, Received Radiation Psychiatric Denies history of Anorexia/bulimia, Confinement Anxiety Hospitalization/Surgery History - cellulitis in leg. - left leg vein ablation. Objective Constitutional No acute distress. Vitals Time Taken: 8:48 AM, Height: 70 in, Weight: 216 lbs, BMI: 31, Temperature: 98.4 F, Pulse: 98 bpm, Respiratory Rate: 18 breaths/min, Blood Pressure: 130/83 mmHg. Respiratory Normal work of breathing on room air. General Notes: 04/13/2022: The right ankle wound has hypertrophic granulation tissue present. The dorsal foot wounds continue to decrease in size. The ischial wound looks about the same today, no better, no worse. Integumentary (Hair, Skin) Wound #41R status is Open. Original cause of wound was Gradually Appeared. The date acquired was: 03/16/2020. The wound has been in treatment 108 weeks. The wound is located on the Left Ischium. The wound measures 3.4cm length x 2.8cm width x 0.1cm depth; 7.477cm^2 area and 0.748cm^3 volume. There is Fat Layer (Subcutaneous Tissue) exposed. There is no tunneling or undermining noted. There is a medium amount of serosanguineous drainage noted. The wound margin is distinct with the outline attached to the wound base. There is large (67-100%) red, friable granulation within the wound bed. There is a small (1- 33%) amount of necrotic tissue within the wound bed including Adherent Slough. Wound #52 status is Open. Original cause of wound was Gradually Appeared. The date acquired was: 03/27/2021. The wound has been in treatment 54 weeks. The wound is located on the Right,Dorsal Foot. The wound measures 0.1cm length x 0.1cm width x 0.1cm depth; 0.008cm^2 area and 0.001cm^3 volume. There is Fat Layer (Subcutaneous Tissue) exposed. There is no tunneling or undermining noted. There is a medium amount of serosanguineous drainage noted. The wound margin is flat and intact. There is large (67-100%) red, pink granulation  within the wound bed. There is a small (1-33%) amount of necrotic tissue within the wound bed including Adherent Slough. Wound #56 status is Open. Original cause of wound was Gradually Appeared. The date acquired was: 07/11/2021. The wound has been in treatment 38 weeks. The wound is located on the Left,Dorsal Foot. The wound measures  2cm length x 0.5cm width x 0.1cm depth; 0.785cm^2 area and 0.079cm^3 volume. There is Fat Layer (Subcutaneous Tissue) exposed. There is no tunneling or undermining noted. There is a medium amount of serosanguineous drainage noted. The wound margin is flat and intact. There is small (1-33%) red granulation within the wound bed. There is a large (67-100%) amount of necrotic tissue within the wound bed including Adherent Slough. Wound #62 status is Open. Original cause of wound was Gradually Appeared. The date acquired was: 03/30/2022. The wound has been in treatment 2 weeks. The wound is located on the Right,Anterior Ankle. The wound measures 1.2cm length x 1.2cm width x 0.1cm depth; 1.131cm^2 area and 0.113cm^3 volume. There is Fat Layer (Subcutaneous Tissue) exposed. There is no tunneling or undermining noted. There is a medium amount of serosanguineous drainage noted. The wound margin is flat and intact. There is large (67-100%) red, hyper - granulation within the wound bed. There is no necrotic tissue within the wound bed. 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 There was a Selective/Open Wound Non-Viable Tissue Debridement with a total area of 2 sq cm performed by Fredirick Maudlin, MD. With the following instrument(s): Curette Material removed includes [facility]. No specimens were taken. A time out was conducted at 09:18, prior to the start of the procedure. A Minimum amount of bleeding was controlled with Pressure. The procedure was tolerated well with a pain level of 0 throughout and a pain level of 0 following the procedure. Post Debridement Measurements: 2cm length x 0.5cm width x 0.1cm depth; 0.079cm^3 volume. Character of Wound/Ulcer Post Debridement is improved. Post procedure Diagnosis Wound #56: Same as Pre-Procedure Wound #62 Pre-procedure diagnosis of Wound #62 is a Venous Leg Ulcer located on the Right,Anterior Ankle . An Chemical Cauterization procedure was performed by Fredirick Maudlin, MD. Post procedure Diagnosis Wound #62: Same as Pre-Procedure Notes: silver nitrate Plan Follow-up Appointments: Return Appointment in 2 weeks. - Dr Celine Ahr - Room 1 - Tuesday 6/6 at 8:15 Bathing/ Shower/ Hygiene: May shower and wash wound with soap and water. - on days that dressing is changed Edema Control - Lymphedema / SCD / Other: Elevate legs to the level of the heart or above for 30 minutes daily and/or when sitting, a frequency of: - throughout the day Moisturize legs daily. - using Aquaphor generously to both legs and feet with dressing changes Compression stocking or Garment 30-40 mm/Hg pressure to: - Juxtalite to both legs daily Off-Loading: Roho cushion for wheelchair Turn and reposition every 2 hours WOUND #41R: - Ischium Wound Laterality: Left Cleanser: Soap and Water Every Other Day/30 Days Discharge Instructions: May shower and wash wound with dial antibacterial soap and water prior to dressing change. Peri-Wound Care: Zinc  Oxide Ointment 30g tube Every Other Day/30 Days Discharge Instructions: Apply Zinc Oxide to periwound with each dressing change Prim Dressing: KerraCel Ag Gelling Fiber Dressing, 4x5 in (silver alginate) (Generic) Every Other Day/30 Days ary Discharge Instructions: Apply  silver alginate to wound bed as instructed Secondary Dressing: ABD Pad, 8x10 (Generic) Every Other Day/30 Days Discharge Instructions: Apply over primary dressing as directed. Secondary Dressing: Woven Gauze Sponge, Non-Sterile 4x4 in Every Other Day/30 Days Discharge Instructions: Apply over primary dressing as directed. Secured With: 328M Medipore H Soft Cloth Surgical T 4 x 2 (in/yd) (Generic) Every Other Day/30 Days ape Discharge Instructions: Secure dressing with tape as directed. WOUND #52: - Foot Wound Laterality: Dorsal, Right Cleanser: Soap and Water Every Other Day/30 Days Discharge Instructions: May shower and wash wound with dial antibacterial soap and water prior to dressing change. Peri-Wound Care: Triamcinolone 15 (g) Every Other Day/30 Days Discharge Instructions: Use triamcinolone 15 (g) as directed Peri-Wound Care: Sween Lotion (Moisturizing lotion) Every Other Day/30 Days Discharge Instructions: Apply Aquaphor moisturizing lotion as directed Peri-Wound Care: Lotrisone Every Other Day/30 Days Discharge Instructions: Apply Lotrisone to periwound Prim Dressing: KerraCel Ag Gelling Fiber Dressing, 2x2 in (silver alginate) Every Other Day/30 Days ary Discharge Instructions: Apply silver alginate to wound bed as instructed Secondary Dressing: Woven Gauze Sponge, Non-Sterile 4x4 in Every Other Day/30 Days Discharge Instructions: Apply over primary dressing as directed. Secured With: [employer], 4.5x3.1 (in/yd) (Generic) Every Other Day/30 Days Discharge Instructions: Secure with Kerlix as directed. Secured With: 328M Medipore H Soft Cloth Surgical T ape, 4 x 10 (in/yd) (Generic) Every Other Day/30  Days Discharge Instructions: Secure with tape as directed. WOUND #56: - Foot Wound Laterality: Dorsal, Left Cleanser: Soap and Water Every Other Day/30 Days Discharge Instructions: May shower and wash wound with dial antibacterial soap and water prior to dressing change. Peri-Wound Care: Triamcinolone 15 (g) Every Other Day/30 Days Discharge Instructions: Use triamcinolone 15 (g) as directed Peri-Wound Care: Sween Lotion (Moisturizing lotion) Every Other Day/30 Days Discharge Instructions: Apply Aquaphor moisturizing lotion as directed Peri-Wound Care: Lotrisone Every Other Day/30 Days Discharge Instructions: Apply Lotrisone to periwound Prim Dressing: KerraCel Ag Gelling Fiber Dressing, 2x2 in (silver alginate) Every Other Day/30 Days ary Discharge Instructions: Apply silver alginate to wound bed as instructed Secondary Dressing: Woven Gauze Sponge, Non-Sterile 4x4 in Every Other Day/30 Days Discharge Instructions: Apply over primary dressing as directed. Secured With: [employer], 4.5x3.1 (in/yd) (Generic) Every Other Day/30 Days Discharge Instructions: Secure with Kerlix as directed. Secured With: 328M Medipore H Soft Cloth Surgical T ape, 4 x 10 (in/yd) (Generic) Every Other Day/30 Days Discharge Instructions: Secure with tape as directed. WOUND #62: - Ankle Wound Laterality: Right, Anterior Cleanser: Soap and Water Every Other Day/30 Days Discharge Instructions: May shower and wash wound with dial antibacterial soap and water prior to dressing change. Peri-Wound Care: Triamcinolone 15 (g) Every Other Day/30 Days Discharge Instructions: Use triamcinolone 15 (g) as directed Peri-Wound Care: Sween Lotion (Moisturizing lotion) Every Other Day/30 Days Discharge Instructions: Apply Aquaphor moisturizing lotion as directed Peri-Wound Care: Lotrisone Every Other Day/30 Days Discharge Instructions: Apply Lotrisone to periwound Prim Dressing: KerraCel Ag Gelling Fiber Dressing, 2x2 in  (silver alginate) Every Other Day/30 Days ary Discharge Instructions: Apply silver alginate to wound bed as instructed Secondary Dressing: Woven Gauze Sponge, Non-Sterile 4x4 in Every Other Day/30 Days Discharge Instructions: Apply over primary dressing as directed. Secured With: [employer], 4.5x3.1 (in/yd) (Generic) Every Other Day/30 Days Discharge Instructions: Secure with Kerlix as directed. Secured With: 328M Medipore H Soft Cloth Surgical T ape, 4 x 10 (in/yd) (Generic) Every Other Day/30 Days Discharge Instructions: Secure with tape as directed. 04/13/2022: The right ankle wound has hypertrophic granulation tissue present.  The dorsal foot wounds continue to decrease in size. The ischial wound looks about the same today, no better, no worse. None of the wounds require debridement today, but I did treat the hypertrophic granulation tissue on his right ankle with chemical cauterization using silver nitrate. We will continue silver alginate to the wound sites. Follow-up in 2 weeks. Electronic Signature(s) Signed: 04/13/2022 9:59:02 AM By: Fredirick Maudlin MD FACS Entered By: Fredirick Maudlin on 04/13/2022 09:59:01 -------------------------------------------------------------------------------- HxROS Details Patient Name: Date of Service: Brillhart, [name] 04/13/2022 8:15 A M Medical Record Number: [id number] Patient Account Number: [id number] Date of Birth/Sex: Treating RN: February 24, 1986 (36 y.o. [name]: [place], [place] Other Clinician: Referring Provider: Treating Provider/Extender: Cornelia Copa [name]: 327 Information Obtained From Patient Ear/Nose/Mouth/Throat Medical History: Negative for: Chronic sinus problems/congestion; Middle ear problems Hematologic/Lymphatic Medical History: Negative for: Anemia; Hemophilia; Human Immunodeficiency Virus; Lymphedema; Sickle Cell Disease Respiratory Medical History: Positive  for: Sleep Apnea - does not tolerate cpap Negative for: Aspiration; Asthma; Chronic Obstructive Pulmonary Disease (COPD); Pneumothorax; Tuberculosis Cardiovascular Medical History: Positive for: Hypertension - lisinopril HCTZ Negative for: Angina; Arrhythmia; Congestive Heart Failure; Coronary Artery Disease; Deep Vein Thrombosis; Hypotension; Myocardial Infarction; Peripheral Arterial Disease; Peripheral Venous Disease; Phlebitis; Vasculitis Gastrointestinal Medical History: Negative for: Cirrhosis ; Colitis; Crohns; Hepatitis A; Hepatitis B; Hepatitis C Endocrine Medical History: Negative for: Type I Diabetes; Type II Diabetes Genitourinary Medical History: Negative for: End Stage Renal Disease Immunological Medical History: Negative for: Lupus Erythematosus; Raynauds; Scleroderma Integumentary (Skin) Medical History: Negative for: History of Burn Musculoskeletal Medical History: Negative for: Gout; Rheumatoid Arthritis; Osteoarthritis; Osteomyelitis Neurologic Medical History: Positive for: Paraplegia Negative for: Dementia; Neuropathy; Quadriplegia; Seizure Disorder Oncologic Medical History: Negative for: Received Chemotherapy; Received Radiation Psychiatric Medical History: Negative for: Anorexia/bulimia; Confinement Anxiety Immunizations Pneumococcal Vaccine: Received Pneumococcal Vaccination: No Immunization Notes: doesn't remember when last tetanus shot was Implantable Devices No devices added Hospitalization / Surgery History Type of Hospitalization/Surgery cellulitis in leg left leg vein ablation Family and Social History Cancer: No; Diabetes: Yes - Father; Heart Disease: No; Hereditary Spherocytosis: No; Hypertension: Yes - Mother; Kidney Disease: No; Lung Disease: No; Stroke: No; Thyroid Problems: No; Tuberculosis: No; Former smoker; Marital Status - Married; Alcohol Use: Moderate; Drug Use: No History; Caffeine Use: Daily; Financial Concerns: No; Food,  Clothing or Shelter Needs: No; Support System Lacking: No; Transportation Concerns: No Engineer, maintenance) Signed: 04/13/2022 10:17:57 AM By: Fredirick Maudlin MD FACS Signed: 04/15/2022 6:06:15 PM By: Baruch Gouty RN, BSN Entered By: Fredirick Maudlin on 04/13/2022 09:57:18 -------------------------------------------------------------------------------- SuperBill Details Patient Name: Date of Service: Keleher, [name] 04/13/2022 Medical Record Number: [id number] Patient Account Number: [id number] Date of Birth/Sex: Treating RN: 1986/03/12 (36 y.o. 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, stage 3 L97.821 Non-pressure chronic ulcer of other part of left lower leg limited to breakdown of skin L97.518 Non-pressure chronic ulcer of other part of right foot with other 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, 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 Non-pressure chronic ulcer of other part of left foot limited to breakdown of skin Facility Procedures CPT4 Code: [id number] 1 Description: 5681 - CHEM CAUT GRANULATION TISS ICD-10 Diagnosis Description L97.518 Non-pressure chronic ulcer of  other part of right foot with other specified seve Modifier: rity Quantity: 1 Physician Procedures : CPT4 Code Description Modifier [id number] - WC PHYS LEVEL 3 - EST PT ICD-10 Diagnosis Description 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

## 2022-04-27 ENCOUNTER — Encounter (HOSPITAL_BASED_OUTPATIENT_CLINIC_OR_DEPARTMENT_OTHER): Payer: BC Managed Care – PPO | Attending: General Surgery | Admitting: General Surgery

## 2022-04-27 DIAGNOSIS — L89323 Pressure ulcer of left buttock, stage 3: Secondary | ICD-10-CM | POA: Diagnosis not present

## 2022-04-27 DIAGNOSIS — G8220 Paraplegia, unspecified: Secondary | ICD-10-CM | POA: Insufficient documentation

## 2022-04-27 DIAGNOSIS — I87332 Chronic venous hypertension (idiopathic) with ulcer and inflammation of left lower extremity: Secondary | ICD-10-CM | POA: Diagnosis not present

## 2022-04-27 DIAGNOSIS — L97512 Non-pressure chronic ulcer of other part of right foot with fat layer exposed: Secondary | ICD-10-CM | POA: Diagnosis not present

## 2022-04-27 DIAGNOSIS — L97522 Non-pressure chronic ulcer of other part of left foot with fat layer exposed: Secondary | ICD-10-CM | POA: Diagnosis not present

## 2022-04-27 DIAGNOSIS — L97821 Non-pressure chronic ulcer of other part of left lower leg limited to breakdown of skin: Secondary | ICD-10-CM | POA: Insufficient documentation

## 2022-04-28 NOTE — Progress Notes (Signed)
Randol, ARMANY MANO ([id number]) Visit Report for 04/27/2022 Arrival Information Details Patient Name: Date of Service: Meaney, [name] 04/27/2022 8:15 A M Medical Record Number: [id number] Patient Account Number: [id number] Date of Birth/Sex: Treating RN: 1985-12-15 (36 y.o. [name] Primary Care Pailynn Vahey: [name], [name] Other Clinician: Referring Taja Pentland: Treating Maeci Kalbfleisch/Extender: Cornelia Copa [name]: 61 Visit Information History Since Last Visit Added or deleted any medications: No Patient Arrived: Wheel Chair Any new allergies or adverse reactions: No Arrival Time: 08:21 Had a fall or experienced change in No Accompanied By: alone activities of daily living that may affect Transfer Assistance: None risk of falls: Patient Identification Verified: Yes Signs or symptoms of abuse/neglect since last visito No Secondary Verification Process Completed: Yes Hospitalized since last visit: No Patient Requires Transmission-Based Precautions: No Implantable device outside of the clinic excluding No Patient Has Alerts: Yes cellular tissue based products placed in the center Patient Alerts: R ABI = 1.0 since last visit: L ABI = 1.1 Has Dressing in Place as Prescribed: Yes Has Compression in Place as Prescribed: Yes Pain Present Now: No Electronic Signature(s) Signed: 04/27/2022 5:33:26 PM By: Levan Hurst RN, BSN Entered By: Levan Hurst on 04/27/2022 08:21:56 -------------------------------------------------------------------------------- Encounter Discharge Information Details Patient Name: Date of Service: Trostel, [name] 04/27/2022 8:15 A M Medical Record Number: [id number] Patient Account Number: [id number] Date of Birth/Sex: Treating RN: January 11, 1986 (36 y.o. [name] Primary Care Tycho Cheramie: [place], [place] Other Clinician: Referring Iyad Deroo: Treating Iseah Plouff/Extender: Cornelia Copa [name]: 329 Encounter  Discharge Information Items Post Procedure Vitals Discharge Condition: Stable Temperature (F): 98.3 Ambulatory Status: Wheelchair Pulse (bpm): 120 Discharge Destination: Home Respiratory Rate (breaths/min): 18 Transportation: Private Auto Blood Pressure (mmHg): 134/76 Accompanied By: alone Schedule Follow-up Appointment: Yes Clinical Summary of Care: Patient Declined Electronic Signature(s) Signed: 04/27/2022 5:33:26 PM By: Levan Hurst RN, BSN Entered By: Levan Hurst on 04/27/2022 09:27:54 -------------------------------------------------------------------------------- Lower Extremity Assessment Details Patient Name: Date of Service: Weatherspoon, [name] 04/27/2022 8:15 A M Medical Record Number: [id number] Patient Account Number: [id number] Date of Birth/Sex: Treating RN: 02-16-1986 (36 y.o. [name] Primary Care Kamika Goodloe: [place], [place] Other Clinician: Referring Annye Forrey: Treating Sarahann Horrell/Extender: Graciela Husbands, [name]: 329 Edema Assessment Assessed: [Left: No] [Right: No] Edema: [Left: Yes] [Right: Yes] Calf Left: Right: Point of Measurement: 33 cm From Medial Instep 32 cm 38 cm Ankle Left: Right: Point of Measurement: 10 cm From Medial Instep 25.5 cm 24.5 cm Vascular Assessment Pulses: Dorsalis Pedis Palpable: [Left:Yes] [Right:Yes] Electronic Signature(s) Signed: 04/27/2022 5:33:26 PM By: Levan Hurst RN, BSN Entered By: Levan Hurst on 04/27/2022 08:34:02 -------------------------------------------------------------------------------- Multi Wound Chart Details Patient Name: Date of Service: Pedrosa, [name] 04/27/2022 8:15 A M Medical Record Number: [id number] Patient Account Number: [id number] Date of Birth/Sex: Treating RN: 04/14/1986 (36 y.o. [name] Primary Care Unknown Schleyer: [name], [name] Other Clinician: Referring Guthrie Lemme: Treating Remiel Corti/Extender: Graciela Husbands, [name]:  329 Vital Signs Height(in): 70 Pulse(bpm): 120 Weight(lbs): 216 Blood Pressure(mmHg): 134/76 Body Mass Index(BMI): 31 Temperature(F): 98.3 Respiratory Rate(breaths/min): 18 Photos: Left Ischium Right, Dorsal Foot Left, Dorsal Foot Wound Location: Gradually Appeared Gradually Appeared Gradually Appeared Wounding Event: Pressure Ulcer Trauma, Other Neuropathic Ulcer-Non Diabetic Primary Etiology: Sleep Apnea, Hypertension, Paraplegia Sleep Apnea, Hypertension, Paraplegia Sleep Apnea, Hypertension, Paraplegia Comorbid History: 03/16/2020 03/27/2021 07/11/2021 Date Acquired: 110 3 40 Weeks of Treatment: Open Open Open Wound Status: Yes No No Wound Recurrence: Yes No Yes Clustered Wound: 3  N/A N/A Clustered Quantity: 3.8x3x0.1 0.1x0.1x0.1 4x3x0.1 Measurements L x W x D (cm) 8.954 0.008 9.425 A (cm) : rea 0.895 0.001 0.942 Volume (cm) : -395.80% 99.60% -51.50% % Reduction in A rea: -394.50% 99.50% -51.40% % Reduction in Volume: Category/Stage II Full Thickness Without Exposed Full Thickness Without Exposed Classification: Support Structures Support Structures Medium Small Medium Exudate A mount: Serosanguineous Serosanguineous Serosanguineous Exudate Type: red, brown red, brown red, brown Exudate Color: Distinct, outline attached Flat and Intact Flat and Intact Wound Margin: Large (67-100%) Large (67-100%) Large (67-100%) Granulation A mount: Red, Pink, Friable Pink Red, Pink Granulation Quality: Small (1-33%) None Present (0%) Small (1-33%) Necrotic A mount: Fat Layer (Subcutaneous Tissue): Yes Fat Layer (Subcutaneous Tissue): Yes Fat Layer (Subcutaneous Tissue): Yes Exposed Structures: Fascia: No Fascia: No Fascia: No Tendon: No Tendon: No Tendon: No Muscle: No Muscle: No Muscle: No Joint: No Joint: No Joint: No Bone: No Bone: No Bone: No Small (1-33%) Large (67-100%) Small (1-33%) Epithelialization: Debridement - Selective/Open Wound  Debridement - Selective/Open Wound Debridement - Selective/Open Wound Debridement: Pre-procedure Verification/Time Out 08:42 08:42 08:42 Taken: [employer], [place], [employer] Tissue Debrided: Non-Viable Tissue Skin/Epidermis Skin/Epidermis Level: 11.'4 4 12 '$ Debridement A (sq cm): rea Curette Curette Curette Instrument: Moderate Minimum Minimum Bleeding: Silver Nitrate Pressure Pressure Hemostasis A chieved: 0 0 0 Procedural Pain: 0 0 0 Post Procedural Pain: Procedure was tolerated well Procedure was tolerated well Procedure was tolerated well Debridement Treatment Response: 3.8x3x0.1 0.1x0.1x0.1 4x3x0.1 Post Debridement Measurements L x W x D (cm) 0.895 0.001 0.942 Post Debridement Volume: (cm) Category/Stage II N/A N/A Post Debridement Stage: Debridement Debridement Debridement Procedures Performed: Wound Number: 55 N/A N/A Photos: N/A N/A Right, Anterior Ankle N/A N/A Wound Location: Gradually Appeared N/A N/A Wounding Event: Venous Leg Ulcer N/A N/A Primary Etiology: Sleep Apnea, Hypertension, Paraplegia N/A N/A Comorbid History: 03/30/2022 N/A N/A Date Acquired: 4 N/A N/A Weeks of Treatment: Healed - Epithelialized N/A N/A Wound Status: No N/A N/A Wound Recurrence: No N/A N/A Clustered Wound: N/A N/A N/A Clustered Quantity: 0x0x0 N/A N/A Measurements L x W x D (cm) 0 N/A N/A A (cm) : rea 0 N/A N/A Volume (cm) : 100.00% N/A N/A % Reduction in A rea: 100.00% N/A N/A % Reduction in Volume: Full Thickness Without Exposed N/A N/A Classification: Support Structures None Present N/A N/A Exudate Amount: N/A N/A N/A Exudate Type: N/A N/A N/A Exudate Color: Flat and Intact N/A N/A Wound Margin: None Present (0%) N/A N/A Granulation Amount: N/A N/A N/A Granulation Quality: None Present (0%) N/A N/A Necrotic Amount: Fascia: No N/A N/A Exposed Structures: Fat Layer (Subcutaneous Tissue): No Tendon: No Muscle: No Joint: No Bone:  No Large (67-100%) N/A N/A Epithelialization: N/A N/A N/A Debridement: N/A N/A N/A Tissue Debrided: N/A N/A N/A Level: N/A N/A N/A Debridement A (sq cm): rea N/A N/A N/A Instrument: N/A N/A N/A Bleeding: N/A N/A N/A Hemostasis A chieved: N/A N/A N/A Procedural Pain: N/A N/A N/A Post Procedural Pain: Debridement Treatment Response: N/A N/A N/A Post Debridement Measurements L x N/A N/A N/A W x D (cm) N/A N/A N/A Post Debridement Volume: (cm) N/A N/A N/A Post Debridement Stage: N/A N/A N/A Procedures Performed: Treatment Notes Wound #[id number] (Ischium) Wound Laterality: Left Cleanser Soap and Water Discharge Instruction: May shower and wash wound with dial antibacterial soap and water prior to dressing change. Peri-Wound Care Zinc Oxide Ointment 30g tube Discharge Instruction: Apply Zinc Oxide to periwound with each dressing change Topical Primary Dressing KerraCel Ag Gelling Fiber  Dressing, 4x5 in (silver alginate) Discharge Instruction: Apply silver alginate to wound bed as instructed Secondary Dressing ABD Pad, 8x10 Discharge Instruction: Apply over primary dressing as directed. Woven Gauze Sponge, Non-Sterile 4x4 in Discharge Instruction: Apply over primary dressing as directed. Secured With 17M Medipore H Soft Cloth Surgical T ape, 4 x 10 (in/yd) Discharge Instruction: Secure with tape as directed. Compression Wrap Compression Stockings Add-Ons Wound #52 (Foot) Wound Laterality: Dorsal, Right Cleanser Soap and Water Discharge Instruction: May shower and wash wound with dial antibacterial soap and water prior to dressing change. Peri-Wound Care Triamcinolone 15 (g) Discharge Instruction: Use triamcinolone 15 (g) as directed Sween Lotion (Moisturizing lotion) Discharge Instruction: Apply Aquaphor moisturizing lotion as directed Lotrisone Discharge Instruction: Apply Lotrisone to periwound Topical Primary Dressing KerraCel Ag Gelling Fiber Dressing, 2x2 in  (silver alginate) Discharge Instruction: Apply silver alginate to wound bed as instructed Secondary Dressing Woven Gauze Sponge, Non-Sterile 4x4 in Discharge Instruction: Apply over primary dressing as directed. Secured With [employer], 4.5x3.1 (in/yd) Discharge Instruction: Secure with Kerlix as directed. 17M Medipore H Soft Cloth Surgical T ape, 4 x 10 (in/yd) Discharge Instruction: Secure with tape as directed. Compression Wrap Compression Stockings Add-Ons Wound #56 (Foot) Wound Laterality: Dorsal, Left Cleanser Soap and Water Discharge Instruction: May shower and wash wound with dial antibacterial soap and water prior to dressing change. Peri-Wound Care Triamcinolone 15 (g) Discharge Instruction: Use triamcinolone 15 (g) as directed Sween Lotion (Moisturizing lotion) Discharge Instruction: Apply Aquaphor moisturizing lotion as directed Lotrisone Discharge Instruction: Apply Lotrisone to periwound Topical Primary Dressing KerraCel Ag Gelling Fiber Dressing, 2x2 in (silver alginate) Discharge Instruction: Apply silver alginate to wound bed as instructed Secondary Dressing Woven Gauze Sponge, Non-Sterile 4x4 in Discharge Instruction: Apply over primary dressing as directed. Secured With [employer], 4.5x3.1 (in/yd) Discharge Instruction: Secure with Kerlix as directed. 17M Medipore H Soft Cloth Surgical T ape, 4 x 10 (in/yd) Discharge Instruction: Secure with tape as directed. Compression Wrap Compression Stockings Add-Ons Electronic Signature(s) Signed: 04/27/2022 9:44:25 AM By: Fredirick Maudlin MD FACS Signed: 04/28/2022 5:06:14 PM By: Baruch Gouty RN, BSN Entered By: Fredirick Maudlin on 04/27/2022 09:44:25 -------------------------------------------------------------------------------- Multi-Disciplinary Care Plan Details Patient Name: Date of Service: Carignan, [name] 04/27/2022 8:15 A M Medical Record Number: [id number] Patient Account Number:  [id number] Date of Birth/Sex: Treating RN: 1986-09-27 (36 y.o. [name] Primary Care Catlynn Grondahl: [name], [name] Other Clinician: Referring Susen Haskew: Treating Candies Palm/Extender: Cornelia Copa [name]: [place] reviewed with physician Active Inactive Wound/Skin Impairment Nursing Diagnoses: Impaired tissue integrity Knowledge deficit related to ulceration/compromised skin integrity Goals: Patient/caregiver will verbalize understanding of skin care regimen Date Initiated: 01/05/2016 Target Resolution Date: 05/28/2022 Goal Status: Active Ulcer/skin breakdown will have a volume reduction of 30% by week 4 Date Initiated: 01/05/2016 Date Inactivated: 12/22/2017 Target Resolution Date: 01/19/2018 Unmet Reason: complex wounds, Goal Status: Unmet infection Interventions: Assess patient/caregiver ability to obtain necessary supplies Assess ulceration(s) every visit Provide education on ulcer and skin care Notes: 02/02/21: Complex Care, ongoing. Electronic Signature(s) Signed: 04/27/2022 5:33:26 PM By: Levan Hurst RN, BSN Entered By: Levan Hurst on 04/27/2022 08:36:52 -------------------------------------------------------------------------------- Pain Assessment Details Patient Name: Date of Service: Gauntt, [name] 04/27/2022 8:15 A M Medical Record Number: [id number] Patient Account Number: [id number] Date of Birth/Sex: Treating RN: 01-08-1986 (36 y.o. [name] Primary Care Sahas Sluka: [place], [place] Other Clinician: Referring Lyle Niblett: Treating Tye Vigo/Extender: Graciela Husbands, [name]: 329 Active Problems Location of Pain Severity and  Description of Pain Patient Has Paino No Site Locations Pain Management and Medication Current Pain Management: Electronic Signature(s) Signed: 04/27/2022 5:33:26 PM By: Levan Hurst RN, BSN Entered By: Levan Hurst on 04/27/2022  08:22:21 -------------------------------------------------------------------------------- Patient/Caregiver Education Details Patient Name: Date of Service: Kaufmann, A Viviann Spare 6/6/2023andnbsp8:15 A M Medical Record Number: [id number] Patient Account Number: [id number] Date of Birth/Gender: Treating RN: Oct 21, 1986 (36 y.o. [name] Primary Care Physician: Janine Limbo Other Clinician: Referring Physician: Treating Physician/Extender: Cornelia Copa [name]: 4 Education Assessment Education Provided To: Patient Education Topics Provided Wound/Skin Impairment: Methods: Explain/Verbal Responses: State content correctly Electronic Signature(s) Signed: 04/27/2022 5:33:26 PM By: Levan Hurst RN, BSN Entered By: Levan Hurst on 04/27/2022 08:37:54 -------------------------------------------------------------------------------- Wound Assessment Details Patient Name: Date of Service: Frisby, [name] 04/27/2022 8:15 A M Medical Record Number: [id number] Patient Account Number: [id number] Date of Birth/Sex: Treating RN: May 27, 1986 (36 y.o. [name] Primary Care Nyquan Selbe: [name], [name] Other Clinician: Referring Brenn Gatton: Treating Keyondra Lagrand/Extender: Graciela Husbands, [name]: 329 Wound Status Wound Number: [id number] Primary Etiology: Pressure Ulcer Wound Location: Left Ischium Wound Status: Open Wounding Event: Gradually Appeared Comorbid History: Sleep Apnea, Hypertension, Paraplegia Date Acquired: 03/16/2020 Weeks Of Treatment: 110 Clustered Wound: Yes Photos Wound Measurements Length: (cm) 3.8 Width: (cm) 3 Depth: (cm) 0.1 Clustered Quantity: 3 Area: (cm) 8.954 Volume: (cm) 0.895 % Reduction in Area: -395.8% % Reduction in Volume: -394.5% Epithelialization: Small (1-33%) Tunneling: No Undermining: No Wound Description Classification: Category/Stage II Wound Margin: Distinct, outline attached Exudate  Amount: Medium Exudate Type: Serosanguineous Exudate Color: red, brown Foul Odor After Cleansing: No Slough/Fibrino Yes Wound Bed Granulation Amount: Large (67-100%) Exposed Structure Granulation Quality: Red, Pink, Friable Fascia Exposed: No Necrotic Amount: Small (1-33%) Fat Layer (Subcutaneous Tissue) Exposed: Yes Necrotic Quality: Adherent Slough Tendon Exposed: No Muscle Exposed: No Joint Exposed: No Bone Exposed: No Treatment Notes Wound #41R (Ischium) Wound Laterality: Left Cleanser Soap and Water Discharge Instruction: May shower and wash wound with dial antibacterial soap and water prior to dressing change. Peri-Wound Care Zinc Oxide Ointment 30g tube Discharge Instruction: Apply Zinc Oxide to periwound with each dressing change Topical Primary Dressing KerraCel Ag Gelling Fiber Dressing, 4x5 in (silver alginate) Discharge Instruction: Apply silver alginate to wound bed as instructed Secondary Dressing ABD Pad, 8x10 Discharge Instruction: Apply over primary dressing as directed. Woven Gauze Sponge, Non-Sterile 4x4 in Discharge Instruction: Apply over primary dressing as directed. Secured With 45M Medipore H Soft Cloth Surgical T ape, 4 x 10 (in/yd) Discharge Instruction: Secure with tape as directed. Compression Wrap Compression Stockings Add-Ons Electronic Signature(s) Signed: 04/27/2022 5:33:26 PM By: Levan Hurst RN, BSN Entered By: Levan Hurst on 04/27/2022 08:39:20 -------------------------------------------------------------------------------- Wound Assessment Details Patient Name: Date of Service: Astacio, [name] 04/27/2022 8:15 A M Medical Record Number: [id number] Patient Account Number: [id number] Date of Birth/Sex: Treating RN: 1985-12-01 (36 y.o. [name] Primary Care Sanjiv Castorena: [name], [name] Other Clinician: Referring Kenzleigh Sedam: Treating Debera Sterba/Extender: Graciela Husbands, [name]: 329 Wound Status Wound  Number: 52 Primary Etiology: Trauma, Other Wound Location: Right, Dorsal Foot Wound Status: Open Wounding Event: Gradually Appeared Comorbid History: Sleep Apnea, Hypertension, Paraplegia Date Acquired: 03/27/2021 Weeks Of Treatment: 56 Clustered Wound: No Photos Wound Measurements Length: (cm) 0.1 Width: (cm) 0.1 Depth: (cm) 0.1 Area: (cm) 0.008 Volume: (cm) 0.001 % Reduction in Area: 99.6% % Reduction in Volume: 99.5% Epithelialization: Large (67-100%) Tunneling: No Undermining: No Wound Description Classification: Full Thickness Without Exposed Support Structures  Wound Margin: Flat and Intact Exudate Amount: Small Exudate Type: Serosanguineous Exudate Color: red, brown Foul Odor After Cleansing: No Slough/Fibrino No Wound Bed Granulation Amount: Large (67-100%) Exposed Structure Granulation Quality: Pink Fascia Exposed: No Necrotic Amount: None Present (0%) Fat Layer (Subcutaneous Tissue) Exposed: Yes Tendon Exposed: No Muscle Exposed: No Joint Exposed: No Bone Exposed: No Treatment Notes Wound #52 (Foot) Wound Laterality: Dorsal, Right Cleanser Soap and Water Discharge Instruction: May shower and wash wound with dial antibacterial soap and water prior to dressing change. Peri-Wound Care Triamcinolone 15 (g) Discharge Instruction: Use triamcinolone 15 (g) as directed Sween Lotion (Moisturizing lotion) Discharge Instruction: Apply Aquaphor moisturizing lotion as directed Lotrisone Discharge Instruction: Apply Lotrisone to periwound Topical Primary Dressing KerraCel Ag Gelling Fiber Dressing, 2x2 in (silver alginate) Discharge Instruction: Apply silver alginate to wound bed as instructed Secondary Dressing Woven Gauze Sponge, Non-Sterile 4x4 in Discharge Instruction: Apply over primary dressing as directed. Secured With [employer], 4.5x3.1 (in/yd) Discharge Instruction: Secure with Kerlix as directed. 44M Medipore H Soft Cloth Surgical T ape, 4 x  10 (in/yd) Discharge Instruction: Secure with tape as directed. Compression Wrap Compression Stockings Add-Ons Electronic Signature(s) Signed: 04/27/2022 5:33:26 PM By: Levan Hurst RN, BSN Entered By: Levan Hurst on 04/27/2022 08:37:34 -------------------------------------------------------------------------------- Wound Assessment Details Patient Name: Date of Service: Kaatz, [name] 04/27/2022 8:15 A M Medical Record Number: [id number] Patient Account Number: [id number] Date of Birth/Sex: Treating RN: Jul 27, 1986 (36 y.o. [name] Primary Care Sarath Privott: [name], [name] Other Clinician: Referring Surafel Hilleary: Treating Zavia Pullen/Extender: Graciela Husbands, [name]: 329 Wound Status Wound Number: 56 Primary Etiology: Neuropathic Ulcer-Non Diabetic Wound Location: Left, Dorsal Foot Wound Status: Open Wounding Event: Gradually Appeared Comorbid History: Sleep Apnea, Hypertension, Paraplegia Date Acquired: 07/11/2021 Weeks Of Treatment: 40 Clustered Wound: Yes Photos Wound Measurements Length: (cm) 4 Width: (cm) 3 Depth: (cm) 0.1 Area: (cm) 9.425 Volume: (cm) 0.942 % Reduction in Area: -51.5% % Reduction in Volume: -51.4% Epithelialization: Small (1-33%) Tunneling: No Undermining: No Wound Description Classification: Full Thickness Without Exposed Support Structu Wound Margin: Flat and Intact Exudate Amount: Medium Exudate Type: Serosanguineous Exudate Color: red, brown res Foul Odor After Cleansing: No Slough/Fibrino Yes Wound Bed Granulation Amount: Large (67-100%) Exposed Structure Granulation Quality: Red, Pink Fascia Exposed: No Necrotic Amount: Small (1-33%) Fat Layer (Subcutaneous Tissue) Exposed: Yes Necrotic Quality: Adherent Slough Tendon Exposed: No Muscle Exposed: No Joint Exposed: No Bone Exposed: No Treatment Notes Wound #56 (Foot) Wound Laterality: Dorsal, Left Cleanser Soap and Water Discharge Instruction: May  shower and wash wound with dial antibacterial soap and water prior to dressing change. Peri-Wound Care Triamcinolone 15 (g) Discharge Instruction: Use triamcinolone 15 (g) as directed Sween Lotion (Moisturizing lotion) Discharge Instruction: Apply Aquaphor moisturizing lotion as directed Lotrisone Discharge Instruction: Apply Lotrisone to periwound Topical Primary Dressing KerraCel Ag Gelling Fiber Dressing, 2x2 in (silver alginate) Discharge Instruction: Apply silver alginate to wound bed as instructed Secondary Dressing Woven Gauze Sponge, Non-Sterile 4x4 in Discharge Instruction: Apply over primary dressing as directed. Secured With [employer], 4.5x3.1 (in/yd) Discharge Instruction: Secure with Kerlix as directed. 44M Medipore H Soft Cloth Surgical T ape, 4 x 10 (in/yd) Discharge Instruction: Secure with tape as directed. Compression Wrap Compression Stockings Add-Ons Electronic Signature(s) Signed: 04/27/2022 5:33:26 PM By: Levan Hurst RN, BSN Entered By: Levan Hurst on 04/27/2022 08:36:22 -------------------------------------------------------------------------------- Wound Assessment Details Patient Name: Date of Service: [place], [name] 04/27/2022 8:15 A M Medical Record Number: [id number] Patient Account Number: [id number] Date  of Birth/Sex: Treating RN: 05/10/1986 (36 y.o. [name] Primary Care Kimbra Marcelino: [name], [name] Other Clinician: Referring Ameilia Rattan: Treating Abraham Entwistle/Extender: Graciela Husbands, [name]: 329 Wound Status Wound Number: 62 Primary Etiology: Venous Leg Ulcer Wound Location: Right, Anterior Ankle Wound Status: Healed - Epithelialized Wounding Event: Gradually Appeared Comorbid History: Sleep Apnea, Hypertension, Paraplegia Date Acquired: 03/30/2022 Weeks Of Treatment: 4 Clustered Wound: No Photos Wound Measurements Length: (cm) Width: (cm) Depth: (cm) Area: (cm) Volume: (cm) 0 % Reduction in  Area: 100% 0 % Reduction in Volume: 100% 0 Epithelialization: Large (67-100%) 0 Tunneling: No 0 Undermining: No Wound Description Classification: Full Thickness Without Exposed Support Structures Wound Margin: Flat and Intact Exudate Amount: None Present Foul Odor After Cleansing: No Slough/Fibrino No Wound Bed Granulation Amount: None Present (0%) Exposed Structure Necrotic Amount: None Present (0%) Fascia Exposed: No Fat Layer (Subcutaneous Tissue) Exposed: No Tendon Exposed: No Muscle Exposed: No Joint Exposed: No Bone Exposed: No Electronic Signature(s) Signed: 04/27/2022 5:33:26 PM By: Levan Hurst RN, BSN Entered By: Levan Hurst on 04/27/2022 08:38:41 -------------------------------------------------------------------------------- [place] Details Patient Name: Date of Service: [place], [name] 04/27/2022 8:15 A M Medical Record Number: [id number] Patient Account Number: [id number] Date of Birth/Sex: Treating RN: [birth year] (36 y.o. [name] Primary Care Ailed Defibaugh: [place], [place] Other Clinician: Referring Raeana Blinn: Treating Kendle Turbin/Extender: Graciela Husbands, [name]: 329 Vital Signs Time Taken: 08:22 Temperature (F): 98.3 Height (in): 70 Pulse (bpm): 120 Weight (lbs): 216 Respiratory Rate (breaths/min): 18 Body Mass Index (BMI): 31 Blood Pressure (mmHg): 134/76 Reference Range: 80 - 120 mg / dl Electronic Signature(s) Signed: 04/27/2022 5:33:26 PM By: Levan Hurst RN, BSN Entered By: Levan Hurst on 04/27/2022 08:22:14

## 2022-04-28 NOTE — Progress Notes (Signed)
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 or concerns, He is tolerating compression wrap. 03/31/17 arrives today with [name] much healthier looking wound on the left lower extremity. We have been using Iodoflex for [name] prolonged period of time which has for the first time prepared and adequate looking wound bed although we have not had much in the way of wound dimension improvement. He also has [name] small wound between the first and second toe on the right 04/07/17; arrives today with [name] healthy-looking wound bed and at least the top 50% of this wound appears to be now her. No debridement was required I have changed him to [facility] last week after prolonged Iodoflex. He did not do well with [facility]. We've had [name] re-opening between the first and second toe on the right 04/14/17; arrives today with [name] healthier looking wound bed contractions and the top 50% of this wound and some on the lesser 50%. Wound bed appears healthy. The area between the first and second toe on the right still remains problematic 04/21/17; continued very gradual improvement. Using [facility] 04/28/17; continued very gradual improvement in the left lateral leg venous insufficiency wound. His periwound erythema is very mild. We have been using Hydrofera Blue. Wound is making progress especially in the superior 50% 05/05/17; he continues to have very gradual improvement in the left lateral venous insufficiency wound. Both in terms with  an length rings are improving. I debrided this every 2 weeks with #5 curet and we have been using Hydrofera Blue and again making good progress With regards to the wounds between his right first and second toe which I thought might of been tinea pedis he is not making as much progress very dry scaly skin over the area. Also the area at the base of the left first and second toe in [name] similar condition 05/12/17; continued gradual improvement in the refractory left lateral venous insufficiency wound on the left. Dimension smaller. Surface still requiring debridement using  Hydrofera Blue 05/19/17; continued gradual improvement in the refractory left lateral venous ulceration. Careful inspection of the wound bed underlying rumination suggested some degree of epithelialization over the surface no debridement indicated. Continue Hydrofera Blue difficult areas between his toes first and third on the left than first and second on the right. I'm going to change to silver alginate from silver collagen. Continue ketoconazole as I suspect underlying tinea pedis 05/26/17; left lateral leg venous insufficiency wound. We've been using Hydrofera Blue. I believe that there is expanding epithelialization over the surface of the wound albeit not coming from the wound circumference. This is [name] bit of an odd situation in which the epithelialization seems to be coming from the surface of the wound rather than in the exact circumference. There is still small open areas mostly along the lateral margin of the wound. He has unchanged areas between the left first and second and the right first second toes which I been treating for tenia pedis 06/02/17; left lateral leg venous insufficiency wound. We have been using Hydrofera Blue. Somewhat smaller from the wound circumference. The surface of the wound remains [name] bit on it almost epithelialized sedation in appearance. I use an open curette today debridement in the surface of all of this  especially the edges Small open wounds remaining on the dorsal right first and second toe interspace and the plantar left first second toe and her face on the left 06/09/17; wound on the left lateral leg continues to be smaller but very gradual and very dry surface using Hydrofera Blue 06/16/17 requires weekly debridements now on the left lateral leg although this continues to contract. I changed to silver collagen last week because of dryness of the wound bed. Using Iodoflex to the areas on his first and second toes/web space bilaterally 06/24/17; patient with history of paraplegia also chronic venous insufficiency with lymphedema. Has [name] very difficult wound on the left lateral leg. This has been gradually reducing in terms of with but comes in with [name] very dry adherent surface. High switch to silver collagen [name] week or so ago with hydrogel to keep the area moist. This is been refractory to multiple dressing attempts. He also has areas in his first and second toes bilaterally in the anterior and posterior web space. I had been using Iodoflex here after [name] prolonged course of silver alginate with ketoconazole was ineffective [question tinea pedis] 07/14/17; patient arrives today with [name] very difficult adherent material over his left lateral lower leg wound. He also has surrounding erythema and poorly controlled edema. He was switched his Santyl last visit which the nurses are applying once during his doctor visit and once on [name] nurse visit. He was also reduced to 2 layer compression I'm not exactly sure of the issue here. 07/21/17; better surface today after 1 week of Iodoflex. Significant cellulitis that we treated last week also better. [Doxycycline] 07/28/17 better surface today with now 2 weeks of Iodoflex. Significant cellulitis treated with doxycycline. He has now completed the doxycycline and he is back to his usual degree of chronic venous inflammation/stasis dermatitis. He reminds me he has had ablations  surgery here 08/04/17; continued improvement with Iodoflex to the left lateral leg wound in terms of the surface of the wound although the dimensions are better. 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 Problematic areas on the plantar aspect of the first second toe web space on the left and the dorsal  aspect of the first second toe web space on the right. At one point I felt these were probably related to chronic fungal infections in treated him aggressively for this although we have not made any improvement here. 08/11/17; left lateral leg. Surface continues to improve with the Iodoflex although we are not seeing much improvement in overall wound dimensions. Areas on his plantar left foot and right foot show no improvement. In fact the right foot looks somewhat worse 08/18/17; left lateral leg. We changed to [facility] Blue last week after [name] prolonged course of Iodoflex which helps get the surface better. It appears that the wound with is improved. Continue with difficult areas on the left dorsal first second and plantar first second on the right 09/01/17; patient arrives in clinic today having had [name] temperature of 103 yesterday. He was seen in the ER and [facility]. The patient was concerned he could have cellulitis again in the right leg however they diagnosed him with [name] UTI and he is now on Keflex. He has [name] history of cellulitis which is been recurrent and difficult but this is been in the left leg, in the past 5 use doxycycline. He does in and out catheterizations at home which are risk factors for UTI 09/08/17; patient will be completing his Keflex this weekend. The erythema on the left leg is considerably better. He has [name] new wound today on the medial part of the right leg small superficial almost looks like [name] skin tear. He has worsening of the area on the right dorsal first and second toe. His major area on the left lateral leg  is better. Using Hydrofera Blue on all areas 09/15/17; gradual reduction in width on the long wound in the left lateral leg. No debridement required. He also has wounds on the plantar aspect of his left first second toe web space and on the dorsal aspect of the right first second toe web space. 09/22/17; there continues to be very gradual improvements in the dimensions of the left lateral leg wound. He hasn't round erythematous spot with might be pressure on his wheelchair. There is no evidence obviously of infection no purulence no warmth He has [name] dry scaled area on the plantar aspect of the left first second toe Improved area on the dorsal right first second toe. 09/29/17; left lateral leg wound continues to improve in dimensions mostly with an is still [name] fairly long but increasingly narrow wound. He has [name] dry scaled area on the plantar aspect of his left first second toe web space Increasingly concerning area on the dorsal right first second toe. In fact I am concerned today about possible cellulitis around this wound. The areas extending up his second toe and although there is deformities here almost appears to abut on the nailbed. 10/06/17; left lateral leg wound continues to make very gradual progress. Tissue culture I did from the right first second toe dorsal foot last time grew MRSA and enterococcus which was vancomycin sensitive. This was not sensitive to clindamycin or doxycycline. He is allergic to Zyvox and sulfa we have therefore arrange for him to have dalvance infusion tomorrow. He is had this in the past and tolerated it well 10/20/17; left lateral leg wound continues to make decent progress. This is certainly reduced in terms of with there is advancing epithelialization.The cellulitis in the right foot looks better although he still has [name] deep wound in the dorsal aspect of the first second toe web space. Plantar left first toe  web space on the left I think is making some  progress 10/27/17; left lateral leg wound continues to make decent progress. Advancing epithelialization.using Hydrofera Blue The right first second toe web space wound is better-looking using silver alginate Improvement in the left plantar first second toe web space. Again using silver alginate 11/03/17 left lateral leg wound continues to make decent progress albeit slowly. Using [facility] The right per second toe web space continues to be [name] very problematic looking punched out wound. I obtained [name] piece of tissue for deep culture I did extensively treated this for fungus. It is difficult to imagine that this is [name] pressure area as the patient states other than going outside he doesn't really wear shoes at home The left plantar first second toe web space looked fairly senescent. Necrotic edges. This required debridement change to [facility] Blue to all wound areas 11/10/17; left lateral leg wound continues to contract. Using Hydrofera Blue On the right dorsal first second toe web space dorsally. Culture I did of this area last week grew MRSA there is not an easy oral option in this patient was multiple antibiotic allergies or intolerances. This was only [name] rare culture isolate I'm therefore going to use Bactroban under silver alginate On the left plantar first second toe web space. Debridement is required here. This is also unchanged 11/17/17; left lateral leg wound continues to contract using Hydrofera Blue this is no longer the major issue. The major concern here is the right first second toe web space. He now has an open area going from dorsally to the plantar aspect. There is now wound on the inner lateral part of the first toe. Not [name] very viable surface on this. There is erythema spreading medially into the forefoot. No major change in the left first second toe plantar wound 11/24/17; left lateral leg wound continues to contract using Hydrofera Blue. Nice improvement today The right first  second toe web space all of this looks [name] lot less angry than last week. I have given him clindamycin and topical Bactroban for MRSA and terbinafine for the possibility of underlining tinea pedis that I could not control with ketoconazole. Looks somewhat better The area on the plantar left first second toe web space is weeping with dried debris around the wound 12/01/17; left lateral leg wound continues to contract he Hydrofera Blue. It is becoming thinner in terms of with nevertheless it is making good improvement. The right first second toe web space looks less angry but still [name] large necrotic-looking wounds starting on the plantar aspect of the right foot extending between the toes and now extensively on the base of the right second toe. I gave him clindamycin and topical Bactroban for MRSA anterior benefiting for the possibility of underlying tinea pedis. Not looking better today The area on the left first/second toe looks better. Debrided of necrotic debris 12/05/17* the patient was worked in urgently today because over the weekend he found blood on his incontinence bad when he woke up. He was found to have an ulcer by his wife who does most of his wound care. He came in today for [place] to look at this. He has not had [name] history of wounds in his buttocks in spite of his paraplegia. 12/08/17; seen in follow-up today at his usual appointment. He was seen earlier this week and found to have [name] new wound on his buttock. We also follow him for wounds on the left lateral leg, left first second toe  web space and right first second toe web space 12/15/17; we have been using Hydrofera Blue to the left lateral leg which has improved. The right first second toe web space has also improved. Left first second toe web space plantar aspect looks stable. The left buttock has worsened using Santyl. Apparently the buttock has drainage 12/22/17; we have been using Hydrofera Blue to the left lateral leg which continues to  improve now 2 small wounds separated by normal skin. He tells [place] he had [name] fever up to 100 yesterday he is prone to UTIs but has not noted anything different. He does in and out catheterizations. The area between the first and second toes today does not look good necrotic surface covered with what looks to be purulent drainage and erythema extending into the third toe. I had gotten this to something that I thought look better last time however it is not look good today. He also has [name] necrotic surface over the buttock wound which is expanded. I thought there might be infection under here so I removed [name] lot of the surface with [name] #5 curet though nothing look like it really needed culturing. He is been using Santyl to this area 12/27/17; his original wound on the left lateral leg continues to improve using Hydrofera Blue. I gave him samples of Baxdella although he was unable to take them out of fear for an allergic reaction ["lump in his throat"].the culture I did of the purulent drainage from his second toe last week showed both enterococcus and [name] set Enterobacter I was also concerned about the erythema on the bottom of his foot although paradoxically although this looks somewhat better today. 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 We've been using silver alginate here. They came up today that he sleeps in [name] recliner, I'm not sure why but I asked him to stop this 01/03/18; his original wound we've been using Hydrofera Blue is now separated into 2 areas. Ulcer on his left buttock is better he is off the recliner and sleeping in bed Finally both wound areas between his first and second toes also looks some better 01/10/18; his original wound on the left lateral leg is now separated into 2 wounds we've been using Hydrofera Blue Ulcer on his left buttock has some drainage. There is [name] small probing site going into  muscle layer superiorly.using silver alginate -He arrives today with [name] deep tissue injury on the left heel The wound on the dorsal aspect of his first second toe on the left looks [name] lot betterusing silver alginate ketoconazole The area on the first second toe web space on the right also looks [name] lot bette 01/17/18; his original wound on the left lateral leg continues to progress using Hydrofera Blue Ulcer on his left buttock also is smaller surface healthier except for [name] small probing site going into the muscle layer superiorly. 2.4 cm of tunneling in this area DTI on his left heel we have only been offloading. Looks better than last week no threatened open no evidence of infection the wound on the dorsal aspect of the first second toe on the left continues to look like it's regressing we have only been using silver alginate and terbinafine orally The area in the first second toe web space on the right also looks to be [name] lot better using silver alginate and terbinafine I think this was prompted by  tinea pedis 01/31/18; the patient was hospitalized in [place] last week apparently for [name] complicated UTI. He was discharged on cefepime he does in and out catheterizations. In the hospital he was discovered M I don't mild elevation of AST and ALT and the terbinafine was stopped.predictably the pressure ulcer on s his buttock looks betterusing silver alginate. The area on the left lateral leg also is better using Hydrofera Blue. The area between the first and second toes on the left better. First and second toes on the right still substantial but better. Finally the DTI on the left heel has held together and looks like it's resolving 02/07/18-he is here in follow-up evaluation for multiple ulcerations. He has new injury to the lateral aspect of the last issue [name] pressure ulcer, he states this is from adhesive removal trauma. He states he has tried multiple adhesive products with no success. All other ulcers  appear stable. The left heel DTI is resolving. We will continue with same treatment plan and follow-up next week. 02/14/18; follow-up for multiple areas. He has [name] new area last week on the lateral aspect of his pressure ulcer more over the posterior trochanter. The original pressure ulcer looks quite stable has healthy granulation. 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 Almost fully epithelialized on the original superior area using Hydrofera Blue DTI on the left heel has peeled off this week to reveal [name] small superficial wound under denuded skin and subcutaneous tissue Both areas between the first and second toes look better including nothing open on the left 02/21/18; The patient's wounds on his left ischial tuberosity and posterior left greater trochanter actually looked better. He has [name] large area of irritation around the area which I think is contact dermatitis. I am doubtful that this is fungal His original wound on the left lateral calf continues to improve we have been using Hydrofera Blue There is no open area in the left first second toe web space although there is [name] lot of thick callus The DTI on the left heel required debridement today of necrotic surface eschar and subcutaneous tissue using silver alginate Finally the area on the right first second toe webspace continues to contract using silver alginate and ketoconazole 02/28/18 Left ischial tuberosity wounds look better using silver alginate. Original wound on the left calf only has one small open area left using Hydrofera Blue DTI on the left heel required debridement mostly removing skin from around this wound surface. Using silver alginate The areas on the right first/second toe web space using silver alginate and ketoconazole 03/08/18 on evaluation today patient appears to be doing decently well as best I can tell in regard to  his wounds. This is the first time that I have seen him as he generally is followed by Dr. Dellia Nims. With that being said none of his wounds appear to be infected he does have an area where there is some skin covering what appears to be [name] new wound on the left dorsal surface of his great toe. This is right at the nail bed. With that being said I do believe that debrided away some of the excess skin can be of benefit in this regard. Otherwise he has been tolerating the dressing changes without complication. 03/14/18; patient arrives today with the multiplicity of wounds that we are following. He has not been systemically unwell Original wound on the left lateral calf now  only has 2 small open areas we've been using Hydrofera Blue which should continue The deep tissue injury on the left heel requires debridement today. We've been using silver alginate The left first second toe and the right first second toe are both are reminiscence what I think was tinea pedis. Apparently some of the callus Surface between the toes was removed last week when it started draining. Purulent drainage coming from the wound on the ischial tuberosity on the left. 03/21/18-He is here in follow-up evaluation for multiple wounds. 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 He tolerated debridement. The only change to last week's recommendations is to discontinue antifungal cream between toes. He will follow-up next week 03/28/18; following up for multiple wounds;Concern this week is streaking redness and swelling in the right foot. He is going to need antibiotics for this. 03/31/18; follow-up for right foot cellulitis. Streaking redness and swelling in the right foot on 03/28/18. 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 I put him on clindamycin 300 mg every 6 and brought him in for [name] quick check. He has an open wound between his first and  second toes on the right foot as [name] potential source. 04/04/18; Right foot cellulitis is resolving he is completing clindamycin. This is truly good news Left lateral calf wound which is initial wound only has one small open area inferiorly this is close to healing out. He has compression stockings. We will use Hydrofera Blue right down to the epithelialization of this Nonviable surface on the left heel which was initially pressure with [name] DTI. We've been using Hydrofera Blue. I'm going to switch this back to silver alginate Left first second toe/tinea pedis this looks better using silver alginate Right first second toe tinea pedis using silver alginate Large pressure ulcers on theLeft ischial tuberosity. Small wound here Looks better. I am uncertain about the surface over the large wound. Using silver alginate 04/11/18; Cellulitis in the right foot is resolved Left lateral calf wound which was his original wounds still has 2 tiny open areas remaining this is just about closed Nonviable surface on the left heel is better but still requires debridement Left first second toe/tinea pedis still open using silver alginate Right first second toe wound tinea pedis I asked him to go back to using ketoconazole and silver alginate Large pressure ulcers on the left ischial tuberosity this shear injury here is resolved. Wound is smaller. No evidence of infection using silver alginate 04/18/18; Patient arrives with an intense area of cellulitis in the right mid lower calf extending into the right heel area. Bright red and warm. Smaller area on the left anterior leg. He has [name] significant history of MRSA. He will definitely need antibioticsdoxycycline He now has 2 open areas on the left ischial tuberosity the original large wound and now [name] satellite area which I think was above his initial satellite areas. Not [name] wonderful surface on this satellite area surrounding erythema which looks like pressure related. His  left lateral calf wound again his original wound is just about closed Left heel pressure injury still requiring debridement Left first second toe looks [name] lot better using silver alginate Right first second toe also using silver alginate and ketoconazole cream also looks better 04/20/18; the patient was worked in early today out of concerns with his cellulitis on the right leg. I had started him on doxycycline. This was 2 days ago. His wife was  concerned about the swelling in the area. Also concerned about the left buttock. He has not been systemically unwell no fever chills. No nausea vomiting or diarrhea 04/25/18; the patient's left buttock wound is continued to deteriorate he is using Hydrofera Blue. He is still completing clindamycin for the cellulitis on the right leg although all of this looks better. 05/02/18 Left buttock wound still with [name] lot of drainage and [name] very tightly adherent fibrinous necrotic surface. He has [name] deeper area superiorly The left lateral calf wound is still closed DTI wound on the left heel necrotic surface especially the circumference using Iodoflex Areas between his left first second toe and right first second toe both look better. Dorsally and the right first second toe he had [name] necrotic surface although at smaller. In using silver alginate and ketoconazole. I did [name] culture last week which was [name] deep tissue culture of the reminiscence of the open wound on the right first second toe dorsally. This grew [name] few Acinetobacter and [name] few methicillin-resistant staph aureus. Nevertheless the area actually this week looked better. I didn't feel the need to specifically address this at least in terms of systemic antibiotics. 05/09/18; wounds are measuring larger more drainage per our intake. We are using Santyl covered with alginate on the large superficial buttock wounds, Iodosorb on the left heel, ketoconazole and silver alginate to the dorsal first and second toes  bilaterally. 05/16/18; The area on his left buttock better in some aspects although the area superiorly over the ischial tuberosity required an extensive debridement.using Santyl Left heel appears stable. Using Iodoflex The areas between his first and second toes are not bad however there is spreading erythema up the dorsal aspect of his left foot this looks like cellulitis again. He is insensate the erythema is really very brilliant.o Erysipelas He went to see an allergist days ago because he was itching part of this he had lab work done. This showed [name] white count of 15.1 with 70% neutrophils. Hemoglobin of 11.4 and [name] platelet count of 659,000. Last white count we had in [facility] was [name] 2-1/2 years ago which was 25.9 but he was ill at the time. He was able to show me some lab work that was done by his primary physician the pattern is about the same. 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 But prompted me to go ahead and do x-rays of both feet and the pelvis rule out osteomyelitis. He also had [name] comprehensive metabolic panel this was reasonably normal his albumin was 3.7 liver function tests BUN/creatinine all normal 05/23/18; x-rays of both his feet from last week were negative for underlying pulmonary abnormality. The x-ray of his pelvis however showed mild irregularity in the left ischial which may represent some early osteomyelitis. The wound in the left ischial continues to get deeper clearly now exposed muscle. Each week necrotic surface material over this area. Whereas the rest of the wounds do not look so bad. The left ischial wound we have been using Santyl and calcium alginate T the left heel surface necrotic debris using Iodoflex o The left lateral leg is still healed Areas on the left dorsal foot and the right dorsal foot are about the same. There is some inflammation on the left which might represent contact dermatitis, fungal dermatitis I am  doubtful cellulitis although this looks better than last week 05/30/18; CT scan done at Hospital did not show any osteomyelitis or abscess.  Suggested the possibility of underlying cellulitis although I don't see [name] lot of evidence of this at the bedside The wound itself on the left buttock/upper thigh actually looks somewhat better. No debridement Left heel also looks better no debridement continue Iodoflex Both dorsal first second toe spaces appear better using Lotrisone. Left still required debridement 06/06/18; Intake reported some purulent looking drainage from the left gluteal wound. Using Santyl and calcium alginate Left heel looks better although still [name] nonviable surface requiring debridement The left dorsal foot first/second webspace actually expanding and somewhat deeper. I may consider doing [name] shave biopsy of this area Right dorsal foot first/second webspace appears stable to improved. Using Lotrisone and silver alginate to both these areas 06/13/18 Left gluteal surface looks better. Now separated in the 2 wounds. No debridement required. Still drainage. We'll continue silver alginate Left heel continues to look better with Iodoflex continue this for at least another week Of his dorsal foot wounds the area on the left still has some depth although it looks better than last week. We've been using Lotrisone and silver alginate 06/20/18 Left gluteal continues to look better healthy tissue Left heel continues to look better healthy granulation wound is smaller. He is using Iodoflex and his long as this continues continue the Iodoflex Dorsal right foot looks better unfortunately dorsal left foot does not. There is swelling and erythema of his forefoot. He had minor trauma to this several days ago but doesn't think this was enough to have caused any tissue injury. Foot looks like cellulitis, we have had this problem before 06/27/18 on evaluation today patient appears to be doing [name] little worse in  regard to his foot ulcer. 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 Both of which would really be his only options for treating this infection. In the past he has been given and effusion of Orbactiv. This is done very well for him in the past again it's one time dosing IV antibiotic therapy. Subsequently I do believe this is something we're gonna need to see about doing at this point in time. Currently his other wounds seem to be doing somewhat better in my pinion I'm pretty happy in that regard. 07/03/18 on evaluation today patient's wounds actually appear to be doing fairly well. He has been tolerating the dressing changes without complication. All in all he seems to be showing signs of improvement. In regard to the antibiotics he has been dealing with infectious disease since I saw him last week as far as getting this scheduled. In the end [facility]'s [facility] this coming Friday. In the meantime he has been continuing to perform the dressing changes in such as previous. There does not appear to be any evidence of infection worsengin at this time. 07/10/18; Since I last saw this man 2 weeks ago things have actually improved. IV antibiotics of resulted in less forefoot erythema although there is still some present. He is not systemically unwell Left buttock wounds 2 now have no depth there is increased epithelialization Using silver alginate Left heel still requires debridement using Iodoflex Left dorsal foot still with [name] sizable wound about the size of [name] border but healthy granulation Right dorsal foot still with [name] slitlike area using silver alginate 07/18/18; the patient's cellulitis in the left foot is  improved in fact I think it is on its way to resolving. Left buttock wounds 2 both look better although  the larger one has hypertension granulation we've been using silver alginate Left heel has some thick circumferential redundant skin over the wound edge which will need to be removed today we've been using Iodoflex Left dorsal foot is still [name] sizable wound required debridement using silver alginate The right dorsal foot is just about closed only [name] small open area remains here 07/25/18; left foot cellulitis is resolved Left buttock wounds 2 both look better. Hyper-granulation on the major area Left heel as some debris over the surface but otherwise looks [name] healthier wound. Using silver collagen Right dorsal foot is just about closed 07/31/18; arrives with our intake nurse worried about purulent drainage from the buttock. We had hyper-granulation here last week His buttock wounds 2 continue to look better Left heel some debris over the surface but measuring smaller. Right dorsal foot unfortunately has openings between the toes Left foot superficial wound looks less aggravated. 08/07/18 Buttock wounds continue to look better although some of her granulation and the larger medial wound. silver alginate Left heel continues to look [name] lot better.silver collagen Left foot superficial wound looks less stable. Requires debridement. He has [name] new wound superficial area on the foot on the lateral dorsal foot. Right foot looks better using silver alginate without Lotrisone 08/14/2018; patient was in the ER last week diagnosed with [name] UTI. He is now on Cefpodoxime and Macrodantin. Buttock wounds continued to be smaller. Using silver alginate Left heel continues to look better using silver collagen Left foot superficial wound looks as though it is improving Right dorsal foot area is just about healed. 08/21/2018; patient is completed his antibiotics for his UTI. He has 2 open areas on the buttocks. There is still not closed although the surface looks satisfactory. Using silver alginate Left heel continues to  improve using silver collagen The bilateral dorsal foot areas which are at the base of his first and second toes/possible tinea pedis are actually stable on the left but worse on the right. The area on the left required debridement of necrotic surface. After debridement I obtained [name] specimen for PCR culture. The right dorsal foot which is been just about healed last week is now reopened 08/28/2018; culture done on the left dorsal foot showed coag negative staph both staph epidermidis and Lugdunensis. I think this is worthwhile initiating systemic treatment. I will use doxycycline given his long list of allergies. The area on the left heel slightly improved but still requiring debridement. The large wound on the buttock is just about closed whereas the smaller one is larger. Using silver alginate in this area 09/04/2018; patient is completing his doxycycline for the left foot although this continues to be [name] very difficult wound area with very adherent necrotic debris. We are using silver alginate to all his wounds right foot left foot and the small wounds on his buttock, silver collagen on the left heel. 09/11/2018; once again this patient has intense erythema and swelling of the left forefoot. Lesser degrees of erythema in the right foot. He has [name] long list of allergies and intolerances. I will reinstitute doxycycline. 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 Using silver alginate Left heel also looks better using silver collagen Unfortunately both the areas on his feet look worse. The area on the left first second webspace is now gone  through to the plantar part of his foot. The area on the left foot anteriorly is irritated with erythema and swelling in the forefoot. 09/25/2018 His wound on the left plantar heel looks better. Using silver collagen The area on the left buttock 2 small remnant areas. One is closed one is still open. Using silver  alginate The areas between both his first and second toes look worse. This in spite of long-standing antifungal therapy with ketoconazole and silver alginate which should have antifungal activity He has small areas around his original wound on the left calf one is on the bottom of the original scar tissue and one superiorly both of these are small and superficial but again given wound history in this site this is worrisome 10/02/2018 Left plantar heel continues to gradually contract using silver collagen Left buttock wound is unchanged using silver alginate The areas on his dorsal feet between his first and second toes bilaterally look about the same. I prescribed clindamycin ointment to see if we can address chronic staph colonization and also the underlying possibility of erythrasma The left lateral lower extremity wound is actually on the lateral part of his ankle. Small open area here. We have been using silver alginate 10/09/2018; Left plantar heel continues to look healthy and contract. No debridement is required Left buttock slightly smaller with [name] tape injury wound just below which was new this week Dorsal feet somewhat improved I have been using clindamycin Left lateral looks lower extremity the actual open area looks worse although [name] lot of this is epithelialized. I am going to change to silver collagen today He has [name] lot more swelling in the right leg although this is not pitting not red and not particularly warm there is [name] lot of spasm in the right leg usually indicative of people with paralysis of some underlying discomfort. We have reviewed his vascular status from 2017 he had [name] left greater saphenous vein ablation. I wonder about referring him back to vascular surgery if the area on the left leg continues to deteriorate. 10/16/2018 in today for follow-up and management of multiple lower extremity ulcers. His left Buttock wound is much lower smaller and almost closed completely.  The wound to the left ankle has began to reopen with Epithelialization and some adherent slough. He has multiple new areas to the left foot and leg. The left dorsal foot without much improvement. Wound present between left great webspace and 2nd toe. Erythema and edema present right leg. Right LE ultrasound obtained on 10/10/18 was negative for DVT . 10/23/2018; Left buttock is closed over. Still dry macerated skin but there is no open wound. I suspect this is chronic pressure/moisture Left lateral calf is quite [name] bit worse than when I saw this last. There is clearly drainage here he has macerated skin into the left plantar heel. We will change the primary dressing to alginate Left dorsal foot has some improvement in overall wound area. Still using clindamycin and silver alginate Right dorsal foot about the same as the left using clindamycin and silver alginate The erythema in the right leg has resolved. He is DVT rule out was negative Left heel pressure area required debridement although the wound is smaller and the surface is health 10/26/2018 The patient came back in for his nurse check today predominantly because of the drainage coming out of the left lateral leg with [name] recent reopening of his original wound on the left lateral calf. He comes in today with [name] large amount  of surrounding erythema around the wound extending from the calf into the ankle and even in the area on the dorsal foot. He is not systemically unwell. He is not febrile. Nevertheless this looks like cellulitis. We have been using silver alginate to the area. I changed him to [name] regular visit and I am going to prescribe him doxycycline. The rationale here is [name] long list of medication intolerances and [name] history of MRSA. I did not see anything that I thought would provide [name] valuable culture 10/30/2018 Follow-up from his appointment 4 days ago with really an extensive area of cellulitis in the left calf left lateral ankle and left  dorsal foot. I put him on doxycycline. He has [name] long list of medication allergies which are true allergy reactions. 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 In any case he is [name] lot better today. The erythema especially in the anterior and lateral left calf is better. He still has left ankle erythema. He also is complaining about increasing edema in the right leg we have only been using Kerlix Coban and he has been doing the wraps at home. Finally he has [name] spotty rash on the medial part of his upper left calf which looks like folliculitis or perhaps wrap occlusion type injury. Small superficial macules not pustules 11/06/18 patient arrives today with again [name] considerable degree of erythema around the wound on the left lateral calf extending into the dorsal ankle and dorsal foot. This is [name] lot worse than when I saw this last week. He is on doxycycline really with not [name] lot of improvement. He has not been systemically unwell Wounds on the; left heel actually looks improved. Original area on the left foot and proximity to the first and second toes looks about the same. He has superficial areas on the dorsal foot, anterior calf and then the reopening of his original wound on the left lateral calf which looks about the same The only area he has on the right is the dorsal webspace first and second which is smaller. He has [name] large area of dry erythematous skin on the left buttock small open area here. 11/13/2018; the patient arrives in much better condition. The erythema around the wound on the left lateral calf is [name] lot better. Not sure whether this was the clindamycin or the TCA and ketoconazole or just in the improvement in edema control [stasis dermatitis]. In any case this is [name] lot better. The area on the left heel is very small and just about resolved using silver collagen we have been using silver alginate to the areas on his dorsal  feet 11/20/2018; his wounds include the left lateral calf, left heel, dorsal aspects of both feet just proximal to the first second webspace. He is stable to slightly improved. I did not think any changes to his dressings were going to be necessary 11/27/2018 he has [name] reopening on the left buttock which is surrounded by what looks like tinea or perhaps some other form of dermatitis. The area on the left dorsal foot has some erythema around it I have marked this area but I am not sure whether this is cellulitis or not. Left heel is not closed. Left calf the reopening is really slightly longer and probably worse 1/13; in general things look better and smaller except for the left dorsal foot. Area on the left heel is just about closed, left buttock looks better only  [name] small wound remains in the skin looks better [using Lotrisone] 1/20; the area on the left heel only has [name] few remaining open areas here. Left lateral calf about the same in terms of size, left dorsal foot slightly larger right lateral foot still not closed. The area on the left buttock has no open wound and the surrounding skin looks [name] lot better 1/27; the area on the left heel is closed. Left lateral calf better but still requiring extensive debridements. The area on his left buttock is closed. He still has the open areas on the left dorsal foot which is slightly smaller in the right foot which is slightly expanded. We have been using Iodoflex on these areas as well 2/3; left heel is closed. Left lateral calf still requiring debridement using Iodoflex there is no open area on his left buttock however he has dry scaly skin over [name] large area of this. Not really responding well to the Lotrisone. Finally the areas on his dorsal feet at the level of the first second webspace are slightly smaller on the right and about the same on the left. Both of these vigorously debrided with Anasept and gauze 2/10; left heel remains closed he has dry  erythematous skin over the left buttock but there is no open wound here. Left lateral leg has come in and with. Still requiring debridement we have been using Iodoflex here. Finally the area on the left dorsal foot and right dorsal foot are really about the same extremely dry callused fissured areas. He does not yet have [name] dermatology appointment 2/17; left heel remains closed. He has [name] new open area on the left buttock. The area on the left lateral calf is bigger longer and still covered in necrotic debris. No major change in his foot areas bilaterally. I am awaiting for [name] dermatologist to look on this. We have been using ketoconazole I do not know that this is been doing any good at all. 2/24; left heel remains closed. The left buttock wound that was new reopening last week looks better. The left lateral calf appears better also although still requires debridement. The major area on his foot is the left first second also requiring debridement. We have been putting Prisma on all wounds. I do not believe that the ketoconazole has done too much good for his feet. He will use Lotrisone I am going to give him [name] 2-week course of terbinafine. We still do not have [name] dermatology appointment 3/2 left heel remains closed however there is skin over bone in this area I pointed this out to him today. The left buttock wound is epithelialized but still does not look completely stable. The area on the left leg required debridement were using silver collagen here. With regards to his feet we changed to Lotrisone last week and silver alginate. 3/9; left heel remains closed. Left buttock remains closed. The area on the right foot is essentially closed. The left foot remains unchanged. Slightly smaller on the left lateral calf. Using silver collagen to both of these areas 3/16-Left heel remains closed. Area on right foot is closed. Left lateral calf above the lateral malleolus open wound requiring debridement with easy  bleeding. Left dorsal wound proximal to first toe also debrided. Left ischial area open new. Patient has been using Prisma with wrapping every 3 days. Dermatology appointment is apparently tomorrow.Patient has completed his terbinafine 2-week course with some apparent improvement according to him, there is still flaking and dry skin in  his foot on the left 3/23; area on the right foot is reopened. The area on the left anterior foot is about the same still [name] very necrotic adherent surface. He still has the area on the left leg and reopening is on the left buttock. He apparently saw dermatology although I do not have [name] note. According to the patient who is usually fairly well informed they did not have any good ideas. Put him on oral terbinafine which she is been on before. 3/30; using silver collagen to all wounds. Apparently his dermatologist put him on doxycycline and rifampin presumably some culture grew staph. I do not have this result. He remains on terbinafine although I have used terbinafine on him before 4/6; patient has had [name] fairly substantial reopening on the right foot between the first and second toes. He is finished his terbinafine and I believe is on doxycycline and rifampin still as prescribed by dermatology. We have been using silver collagen to all his wounds although the patient reports that he thinks silver alginate does better on the wounds on his buttock. 4/13; the area on his left lateral calf about the same size but it did not require debridement. Left dorsal foot just proximal to the webspace between the first and second toes is about the same. Still nonviable surface. I note some superficial bronze discoloration of the dorsal part of his foot Right dorsal foot just proximal to the first and second toes also looks about the same. I still think there may be the same discoloration I noted above on the left Left buttock wound looks about the same 4/20; left lateral calf  appears to be gradually contracting using silver collagen. 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 The left dorsal foot wound is debrided of tightly adherent necrotic debris and really cleans up quite nicely. The right area is worse with expansion. I did not debride this it is now over the base of the second toe The area on his left buttock is smaller no debridement is required using silver collagen 5/4; left calf continues to make good progress. He arrives with erythema around the wounds on his dorsal foot which even extends to the plantar aspect. Very concerning for coexistent infection. He is finished the erythromycin I gave him for possible erythrasma this does not seem to have helped. The area on the left foot is about the same base of the dorsal toes Is area on the buttock looks improved on the left 5/11; left calf and left buttock continued to make good progress. Left foot is about the same to slightly improved. Major problem is on the right foot. He has not had an x-ray. Deep tissue culture I did last week showed both Enterobacter and E. coli. I did not change the doxycycline I put him on empirically although neither 1 of these were plated to doxycycline. He arrives today with the erythema looking worse on both the dorsal and plantar foot. Macerated skin on the bottom of the foot. he has not been systemically unwell 5/18-Patient returns at 1 week, left calf wound appears to be making some progress, left buttock wound appears slightly worse than last time, left foot wound looks slightly better, right foot redness is marginally better. X-ray of both feet show no air or evidence of osteomyelitis. Patient is finished his Omnicef and terbinafine. He continues to have macerated skin on the bottom of the left foot as well as right 5/26; left calf  wound is better, left buttock wound appears to have multiple small superficial open areas with  surrounding macerated skin. X-rays that I did last time showed no evidence of osteomyelitis in either foot. He is finished cefdinir and doxycycline. I do not think that he was on terbinafine. He continues to have [name] large superficial open area on the right foot anterior dorsal and slightly between the first and second toes. I did send him to dermatology 2 months ago or so wondering about whether they would do [name] fungal scraping. I do not believe they did but did do [name] culture. We have been using silver alginate to the toe areas, he has been using antifungals at home topically either ketoconazole or Lotrisone. We are using silver collagen on the left foot, silver alginate on the right, silver collagen on the left lateral leg and silver alginate on the left buttock 6/1; left buttock area is healed. We have the left dorsal foot, left lateral leg and right dorsal foot. We are using silver alginate to the areas on both feet and silver collagen to the area on his left lateral calf 6/8; the left buttock apparently reopened late last week. He is not really sure how this happened. He is tolerating the terbinafine. Using silver alginate to all wounds 6/15; left buttock wound is larger than last week but still superficial. Came in the clinic today with [name] report of purulence from the left lateral leg I did not identify any infection Both areas on his dorsal feet appear to be better. He is tolerating the terbinafine. Using silver alginate to all wounds 6/22; left buttock is about the same this week, left calf quite [name] bit better. His left foot is about the same however he comes in with erythema and warmth in the right forefoot once again. Culture that I gave him in the beginning of May showed Enterobacter and E. coli. I gave him doxycycline and things seem to improve although neither 1 of these organisms was specifically plated. 6/29; left buttock is larger and dry this week. Left lateral calf looks to me to be  improved. Left dorsal foot also somewhat improved right foot completely unchanged. The erythema on the right foot is still present. He is completing the Ceftin dinner that I gave him empirically [see discussion above.) 7/6 - All wounds look to be stable and perhaps improved, the left buttock wound is slightly smaller, per patient bleeds easily, completed ceftin, the right foot redness is less, he is on terbinafine 7/13; left buttock wound about the same perhaps slightly narrower. Area on the left lateral leg continues to narrow. Left dorsal foot slightly smaller right foot about the same. We are using silver alginate on the right foot and Hydrofera Blue to the areas on the left. Unna boot on the left 2 layer compression on the right 7/20; left buttock wound absolutely the same. Area on lateral leg continues to get better. Left dorsal foot require debridement as did the right no major change in the 7/27; left buttock wound the same size necrotic debris over the surface. The area on the lateral leg is closed once again. His left foot looks better right foot about the same although there is some involvement now of the posterior first second toe area. He is still on terbinafine which I have given him for [name] month, not certain [name] centimeter major change 06/25/19-All wounds appear to be slightly improved according to report, left buttock wound looks clean, both foot wounds have  minimal to no debris the right dorsal foot has minimal slough. We are using Hydrofera Blue to the left and silver alginate to the right foot and ischial wound. 8/10-Wounds all appear to be around the same, the right forefoot distal part has some redness which was not there before, however the wound looks clean and small. Ischial wound looks about the same with no changes 8/17; his wound on the left lateral calf which was his original chronic venous insufficiency wound remains closed. Since I last saw him the areas on the left dorsal  foot right dorsal foot generally appear better but require debridement. The area on his left initial tuberosity appears somewhat larger to me perhaps hyper granulated and bleeds very easily. We have been using Hydrofera Blue to the left dorsal foot and silver alginate to everything else 8/24; left lateral calf remains closed. The areas on his dorsal feet on the webspace of the first and second toes bilaterally both look better. The area on the left buttock which is the pressure ulcer stage II slightly smaller. I change the dressing to Hydrofera Blue to all areas 8/31; left lateral calf remains closed. The area on his dorsal feet bilaterally look better. Using Hydrofera Blue. Still requiring debridement on the left foot. No change in the left buttock pressure ulcers however 9/14; left lateral calf remains closed. Dorsal feet look quite [name] bit better than 2 weeks ago. Flaking dry skin also [name] lot better with the ammonium lactate I gave him 2 weeks ago. The area on the left buttock is improved. He states that his Roho cushion developed [name] leak and he is getting [name] new one, in the interim he is offloading this vigorously 9/21; left calf remains closed. Left heel which was [name] possible DTI looks better this week. He had macerated tissue around the left dorsal foot right foot looks satisfactory and improved left buttock wound. I changed his dressings to his feet to silver alginate bilaterally. Continuing Hydrofera Blue on the left buttock. 9/28 left calf remains closed. Left heel did not develop anything [possible DTI] dry flaking skin on the left dorsal foot. Right foot looks satisfactory. Improved left buttock wound. We are using silver alginate on his feet Hydrofera Blue on the buttock. I have asked him to go back to the Lotrisone on his feet including the wounds and surrounding areas 10/5; left calf remains closed. The areas on the left and right feet about the same. [name] lot of this is epithelialized however  debris over the remaining open areas. He is using Lotrisone and silver alginate. The area on the left buttock using Hydrofera Blue 10/26. Patient has been out for 3 weeks secondary to Covid concerns. He tested negative but I think his wife tested positive. He comes in today with the left foot substantially worse, right foot about the same. Even more concerning he states that the area on his left buttock closed over but then reopened and is considerably deeper in one aspect than it was before [stage III wound] 11/2; left foot really about the same as last week. Quarter sized wound on the dorsal foot just proximal to the first second toes. Surrounding erythema with areas of denuded epithelium. This is not really much different looking. Did not look like cellulitis this time however. Right foot area about the same.. We have been using silver alginate alginate on his toes Left buttock still substantial irritated skin around the wound which I think looks somewhat better. We have been using  Hydrofera Blue here. 11/9; left foot larger than last week and [name] very necrotic surface. Right foot I think is about the same perhaps slightly smaller. Debris around the circumference also addressed. Unfortunately on the left buttock there is been [name] decline. Satellite lesions below the major wound distally and now [name] an additional one posteriorly we have been using Hydrofera Blue but I think this is [name] pressure issue 11/16; left foot ulcer dorsally again [name] very adherent necrotic surface. Right foot is about the same. Not much change in the pressure ulcer on his left buttock. 11/30; left foot ulcer dorsally basically the same as when I saw him 2 weeks ago. Very adherent fibrinous debris on the wound surface. Patient reports [name] lot of drainage as well. 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 We have been using Iodoflex, patient changed back to alginate because of the  drainage. Area on his right dorsal foot really looks benign with [name] healthier surface certainly [name] lot better than on the left. Left buttock wounds all improved using Hydrofera Blue 12/7; left dorsal foot again no improvement. Tightly adherent debris. PCR culture I did last week only showed likely skin contaminant. I have gone ahead and done [name] punch biopsy of this which is about the last thing in terms of investigations I can think to do. He has known venous insufficiency and venous hypertension and this could be the issue here. The area on the right foot is about the same left buttock slightly worse according to our intake nurse secondary to [facility] Blue sticking to the wound 12/14; biopsy of the left foot that I did last time showed changes that could be related to wound healing/chronic stasis dermatitis phenomenon no neoplasm. We have been using silver alginate to both feet. I change the one on the left today to Sorbact and silver alginate to his other 2 wounds 12/28; the patient arrives with the following problems; Major issue is the dorsal left foot which continues to be [name] larger deeper wound area. Still with [name] completely nonviable surface Paradoxically the area mirror image on the right on the right dorsal foot appears to be getting better. He had some loss of dry denuded skin from the lower part of his original wound on the left lateral calf. Some of this area looked [name] little vulnerable and for this reason we put him in wrap that on this side this week The area on his left buttock is larger. He still has the erythematous circular area which I think is [name] combination of pressure, sweat. This does not look like cellulitis or fungal dermatitis 11/26/2019; -Dorsal left foot large open wound with depth. Still debris over the surface. Using Sorbact The area on the dorsal right foot paradoxically has closed over He has [name] reopening on the left ankle laterally at the base of his original wound that  extended up into the calf. This appears clean. The left buttock wound is smaller but with very adherent necrotic debris over the surface. We have been using silver alginate here as well The patient had arterial studies done in 2017. He had biphasic waveforms at the dorsalis pedis and posterior tibial bilaterally. ABI in the left was 1.17. Digit waveforms were dampened. He has slight spasticity in the great toes I do not think [name] TBI would be possible 1/11; the patient comes in today with [name] sizable reopening between the first and second toes on the right. This is not  exactly in the same location where we have been treating wounds previously. According to our intake nurse this was actually fairly deep but 0.6 cm. The area on the left dorsal foot looks about the same the surface is somewhat cleaner using Sorbact, his MRI is in 2 days. We have not managed yet to get arterial studies. The new reopening on the left lateral calf looks somewhat better using alginate. The left buttock wound is about the same using alginate 1/18; the patient had his ARTERIAL studies which were quite normal. ABI in the right at 1.13 with triphasic/biphasic waveforms on the left ABI 1.06 again with triphasic/biphasic waveforms. It would not have been possible to have done [name] toe brachial index because of spasticity. We have been using Sorbac to the left foot alginate to the rest of his wounds on the right foot left lateral calf and left buttock 1/25; arrives in clinic with erythema and swelling of the left forefoot worse over the first MTP area. This extends laterally dorsally and but also posteriorly. Still has an area on the left lateral part of the lower part of his calf wound it is eschared and clearly not closed. Area on the left buttock still with surrounding irritation and erythema. Right foot surface wound dorsally. The area between the right and first and second toes appears better. 2/1; The left foot wound is about the  same. Erythema slightly better I gave him [name] week of doxycycline empirically Right foot wound is more extensive extending between the toes to the plantar surface Left lateral calf really no open surface on the inferior part of his original wound however the entire area still looks vulnerable Absolutely no improvement in the left buttock wound required debridement. 2/8; the left foot is about the same. Erythema is slightly improved I gave him clindamycin last week. Right foot looks better he is using Lotrimin and silver alginate He has [name] breakdown in the left lateral calf. Denuded epithelium which I have removed Left buttock about the same were using Hydrofera Blue 2/15; left foot is about the same there is less surrounding erythema. Surface still has tightly adherent debris which I have debriding however not making any progress Right foot has [name] substantial wound on the medial right second toe between the first and second webspace. Still an open area on the left lateral calf distal area. Buttock wound is about the same 2/22; left foot is about the same less surrounding erythema. Surface has adherent debris. Polymen Ag Right foot area significant wound between the first and second toes. We have been using silver alginate here Left lateral leg polymen Ag at the base of his original venous insufficiency wound Left buttock some improvement here 3/1; Right foot is deteriorating in the first second toe webspace. Larger and more substantial. We have been using silver alginate. Left dorsal foot about the same markedly adherent surface debris using PolyMem Ag Left lateral calf surface debris using PolyMem AG Left buttock is improved again using PolyMem Ag. He is completing his terbinafine. The erythema in the foot seems better. He has been on this for 2 weeks 3/8; no improvement in any wound area in fact he has [name] small open area on the dorsal midfoot which is new this week. He has not gotten his foot  x-rays yet 3/15; his x-rays were both negative for osteomyelitis of both feet. No major change in any of his wounds on the extremities however his buttock wounds are better. We have been using  polymen on the buttocks, left lower leg. Iodoflex on the left foot and silver alginate on the right 3/22; arrives in clinic today with the 2 major issues are the improvement in the left dorsal foot wound which for once actually looks healthy with [name] nice healthy wound surface without debridement. Using Iodoflex here. Unfortunately on the left lateral calf which is in the distal part of his original wound he came to the clinic here for there was purulent drainage noted some increased breakdown scattered around the original area and [name] small area proximally. We we are using polymen here will change to silver alginate today. His buttock wound on the left is better and I think the area on the right first second toe webspace is also improved 3/29; left dorsal foot looks better. Using Iodoflex. Left ankle culture from deterioration last time grew E. coli, Enterobacter and Enterococcus. I will give him [name] course of cefdinir although that will not cover Enterococcus. The area on the right foot in the webspace of the first and second toe lateral first toe looks better. The area on his buttock is about healed Vascular appointment is on April 21. This is to look at his venous system vis--vis continued breakdown of the wounds on the left including the left lateral leg and left dorsal foot he. He has had previous ablations on this side 4/5; the area between the right first and second toes lateral aspect of the first toe looks better. Dorsal aspect of the left first toe on the left foot also improved. Unfortunately the left lateral lower leg is larger and there is [name] second satellite wound superiorly. The usual superficial abrasions on the left buttock overall better but certainly not closed 4/12; the area between the right  first and second toes is improved. Dorsal aspect of the left foot also slightly smaller with [name] vibrant healthy looking surface. No real change in the left lateral leg and the left buttock wound is healed He has an unaffordable co-pay for Apligraf. Appointment with vein and vascular with regards to the left leg venous part of the circulation is on 4/21 4/19; we continue to see improvement in all wound areas. Although this is minor. He has his vascular appointment on 4/21. The area on the left buttock has not reopened although right in the center of this area the skin looks somewhat threatened 4/26; the left buttock is unfortunately reopened. In general his left dorsal foot has [name] healthy surface and looks somewhat smaller although it was not measured as such. The area between his first and second toe webspace on the right as [name] small wound against the first toe. The patient saw vascular surgery. The real question I was asking was about the small saphenous vein on the left. He has previously ablated left greater saphenous vein. Nothing further was commented on on the left. Right greater saphenous vein without reflux at the saphenofemoral junction or proximal thigh there was no indication for ablation of the right greater saphenous vein duplex was negative for DVT bilaterally. They did not think there was anything from [name] vascular surgery point of view that could be offered. They ABIs within normal limits 5/3; only small open area on the left buttock. The area on the left lateral leg which was his original venous reflux is now 2 wounds both which look clean. We are using Iodoflex on the left dorsal foot which looks healthy and smaller. He is down to [name] very tiny area between the right  first and second toes, using silver alginate 5/10; all of his wounds appear better. We have much better edema control in 4 layer compression on the left. This may be the factor that is allowing the left foot and left lateral  calf to heal. He has external compression garments at home 04/14/20-All of his wounds are progressing well, the left forefoot is practically closed, left ischium appears to be about the same, right toe webspace is also smaller. The left lateral leg is about the same, continue using Hydrofera Blue to this, silver alginate to the ischium, Iodoflex to the toe space on the right 6/7; most of his wounds outside of the left buttock are doing well. The area on the left lateral calf and left dorsal foot are smaller. The area on the right foot in between the first and second toe webspace is barely visible although he still says there is some drainage here is the only reason I did not heal this out. Unfortunately the area on the left buttock almost looks like he has [name] skin tear from tape. He has open wound and then [name] large flap of skin that we are trying to get adherence over an area just next to the remaining wound 6/21; 2 week follow-up. I believe is been here for nurse visits. Miraculously the area between his first and second toes on the left dorsal foot is closed over. Still open on the right first second web space. The left lateral calf has 2 open areas. Distally this is more superficial. The proximal area had [name] little more depth and required debridement of adherent necrotic material. His buttock wound is actually larger we have been using silver alginate here 6/28; the patient's area on the left foot remains closed. Still open wet area between the first and second toes on the right and also extending into the plantar aspect. We have been using silver alginate in this location. He has 2 areas on the left lower leg part of his original long wounds which I think are better. We have been using Hydrofera Blue here. Hydrofera Blue to the left buttock which is stable 7/12; left foot remains closed. Left ankle is closed. May be [name] small area between his right first and second toes the only truly open area is on the  left buttock. We have been using Hydrofera Blue here 7/19; patient arrives with marked deterioration especially in the left foot and ankle. We did not put him in [name] compression wrap on the left last week in fact he wore his juxta lite stockings on either side although he does not have an underlying stocking. He has [name] reopening on the left dorsal foot, left lateral ankle and [name] new area on the right dorsal ankle. More worrisome is the degree of erythema on the left foot extending on the lateral foot into the lateral lower leg on the left 7/26; the patient had erythema and drainage from the lateral left ankle last week. Culture of this grew MRSA resistant to doxycycline and clindamycin which are the 2 antibiotics we usually use with this patient who has multiple antibiotic allergies including linezolid, trimethoprim sulfamethoxazole. I had give him an empiric doxycycline and he comes in the area certainly looks somewhat better although it is blotchy in his lower leg. He has not been systemically unwell. He has had areas on the left dorsal foot which is [name] reopening, chronic wounds on the left lateral ankle. Both of these I think are secondary to chronic  venous insufficiency. The area between his first and second toes is closed as far as I can tell. He had [name] new wrap injury on the right dorsal ankle last week. Finally he has an area on the left buttock. We have been using silver alginate to everything except the left buttock we are using Hydrofera Blue 06/30/20-Patient returns at 1 week, has been given [name] sample dose pack of NUZYRA which is [name] tetracycline derivative [omadacycline], patient has completed those, we have been using silver alginate to almost all the wounds except the left ischium where we are using Hydrofera Blue all of them look better 8/16; since I last saw the patient he has been doing well. The area on the left buttock, left lateral ankle and left foot are all closed today. He has completed  the [place] I gave him last time and tolerated this well. He still has open areas on the right dorsal ankle and in the right first second toe area which we are using silver alginate. 8/23; we put him in his bilateral external compression stockings last week as he did not have anything open on either leg except for concerning area between the right first and second toe. He comes in today with an area on the left dorsal foot slightly more proximal than the original wound, the left lateral foot but this is actually [name] continuation of the area he had on the left lateral ankle from last time. As well he is opened up on the left buttock again. 8/30; comes in today with things looking [name] lot better. The area on the left lower ankle has closed down as has the left foot but with eschar in both areas. The area on the dorsal right ankle is also epithelialized. Very little remaining of the left buttock wound. We have been using silver alginate on all wound areas 9/13; the area in the first second toe webspace on the right has fully epithelialized. He still has some vulnerable epithelium on the right and the ankle and the dorsal foot. He notes weeping. He is using his juxta lite stocking. On the left again the left dorsal foot is closed left lateral ankle is closed. We went to the juxta lite stocking here as well. Still vulnerable in the left buttock although only 2 small open areas remain here 9/27; 2-week follow-up. We did not look at his left leg but the patient says everything is closed. He is [name] bit disturbed by the amount of edema in his left foot he is using juxta lite stockings but asking about over the toes stockings which would be 30/40, will talk to him next time. According to him there is no open wound on either the left foot or the left ankle/calf He has an open area on the dorsal right calf which I initially point [name] wrap injury. He has superficial remaining wound on the left ischial tuberosity been  using silver alginate although he says this sticks to the wound 10/5; we gave him 2-week follow-up but he called yesterday expressing some concerns about his right foot right ankle and the left buttock. He came in early. There is still no open areas on the left leg and that still in his juxta lite stocking 10/11; he only has 1 small area on the left buttock that remains measuring millimeters 1 mm. Still has the same irritated skin in this area. We recommended zinc oxide when this eventually closes and pressure relief is meticulously is he can do this.  He still has an area on the dorsal part of his right first through third toes which is [name] bit irritated and still open and on the dorsal ankle near the crease of the ankle. We have been using silver alginate and using his own stocking. He has nothing open on the left leg or foot 10/25; 2-week follow-up. Not nearly as good on the left buttock as I was hoping. For open areas with 5 looking threatened small. He has the erythematous irritated chronic skin in this area. 1 area on the right dorsal ankle. He reports this area bleeds easily Right dorsal foot just proximal to the base of his toes We have been using silver alginate. 11/8; 2-week follow-up. Left buttock is about the same although I do not think the wounds are in the same location we have been using silver alginate. I have asked him to use zinc oxide on the skin around the wounds. He still has [name] small area on the right dorsal ankle he reports this bleeds easily Right dorsal foot just proximal to the base of the toes does not have anything open although the skin is very dry and scaly He has [name] new opening on the nailbed of the left great toe. Nothing on the left ankle 11/29; 3-week follow-up. Left buttock has 2 open areas. And washing of these wounds today started bleeding easily. Suggesting very friable tissue. We have been using silver alginate. Right dorsal ankle which I thought was initially [name]  wrap injury we have been using silver alginate. Nothing open between the toes that I can see. He states the area on the left dorsal toe nailbed healed after the last visit in 2 or 3 days 12/13; 3-week follow-up. His left buttock now has 3 open areas but the original 2 areas are smaller using polymen here. Surrounding skin looks better. The right dorsal ankle is closed. He has [name] small opening on the right dorsal foot at the level of the third toe. In general the skin looks better here. He is wearing his juxta lite stocking on the left leg says there is nothing open 11/24/2020; 3 weeks follow-up. His left buttock still has the 3 open areas. We have been using polymen but due to lack of response he changed to [facility] area. Surrounding skin is dry erythematous and irritated looking. There is no evidence of infection either bacterial or fungal however there is loss of surface epithelium He still has very dry skin in his foot causing irritation and erythema on the dorsal part of his toes. This is not responded to prolonged courses of antifungal simply looks dry and irritated 1/24; left buttock area still looks about the same he was unable to find the triad ointment that we had suggested. The area on the right lower leg just above the dorsal ankle has reopened and the areas on the right foot between the first second and second third toes and scaling on the bottom of the foot has been about the same for quite some time now. been using silver alginate to all wound areas 2/7; left buttock wound looked quite good although not much smaller in terms of surface area surrounding skin looks better. Only [name] few dry flaking areas on the right foot in between the first and second toes the skin generally looks better here [ammonium lactate]. Finally the area on the right dorsal ankle is closed 2/21; There is no open area on the right foot even between the right first and  second toe. Skin around this area dorsally  and plantar aspects look better. He has [name] reopening of the area on the right ankle just above the crease of the ankle dorsally. I continue to think that this is probably friction from spasms may be even this time with his stocking under the compression stockings. Wounds on his left buttock look about the same there [name] couple of areas that have reopened. He has [name] total square area of loss of epithelialization. This does not look like infection it looks like [name] contact dermatitis but I just cannot determine to what 3/14; there is nothing on the right foot between the first and second toes this was carefully inspected under illumination. Some chronic irritation on the dorsal part of his foot from toes 1-3 at the base. Nothing really open here substantially. Still has an area on the right foot/ankle that is actually larger and hyper granulated. His buttock area on the left is just about closed however he has chronic inflammation with loss of the surface epithelial layer 3/28; 2-week follow-up. In clinic today with [name] new wound on the left anterior mid tibia. Says this happened about 2 weeks ago. He is not really sure how wonders about the spasticity of his legs at night whether that could have caused this other than that he does not have [name] good idea. He has been using topical antibiotics and silver alginate. The area on his right dorsal ankle seems somewhat better. Finally everything on his left buttock is closed. 4/11; 2-week follow-up. All of his wounds are better except for the area over the ischium and left buttock which have opened up widely again. At least part of this is covered in necrotic fibrinous material another part had rolled nonviable skin. The area on the right ankle, left anterior mid tibia are both [name] lot better. He had no open wounds on either foot including the areas between the first and second toes 4/25; patient presents for 2-week follow-up. He states that the wounds are overall  stable. He has no complaints today and states he is using Hydrofera Blue to open wounds. 5/9; have not seen this man in over [name] month. For my memory he has open areas on the left mid tibia and right ankle. T oday he has new open area on the right dorsal foot which we have not had [name] problem with recently. He has the sustained area on the left buttock He is also changed his insurance at the beginning of the year [employer]. We will need prior authorizations for debridement 5/23; patient presents for 2-week follow-up. He has prior authorizations for debridement. He denies any issues in the past 2 weeks with his wound care. He has been using Hydrofera Blue to all the wounds. He does report [name] circular rash to the upper left leg that is new. He denies acute signs of infection. 6/6; 2-week follow-up. The patient has open wounds on the left buttock which are worse than the last time I saw this about [name] month ago. He also has [name] new area to me on the left anterior mid tibia with some surrounding erythema. The area on the dorsal ankle on the right is closed but I think this will be [name] friction injury every time this area is exposed to either our wraps or his compression stockings caused by unrelenting spasms in this leg. 6/20; 2-week follow-up. The patient has open wounds on the left buttock which is about the same. Using [facility]  here. - The left mid tibia has [name] static amount of surrounding erythema. Also [name] raised area in the center. We have been using Hydrofera Blue here. Finally he has broken down in his dorsal right foot extending between the first and second toes and going to the base of the first and second toe webspace. I have previously assumed that this was severe venous hypertension 7/5; 2-week follow-up The left buttock wound actually looks better. We are using Hydrofera Blue. He has extensive skin irritation around this area and I have not really been able to get that any better. I have tried  Lotrisone i.e. antifungals and steroids. More most recently we have just been using Coloplast really looks about the same. The left mid tibia which was new last week culture to have very resistant staph aureus. Not only methicillin-resistant but doxycycline resistant. The patient has [name] plethora of antibiotic allergies including sulfa, linezolid. I used topical bacitracin on this but he has not started this yet. In addition he has an expanding area of erythema with [name] wound on the dorsal right foot. I did [name] deep tissue culture of this area today 7/12; Left buttock area actually looks better surrounding skin also looks less irritated. Left mid tibia looks about the same. He is using bacitracin this is not worse Right dorsal foot looks about the same as well. The left first toe also looks about the same 7/19; left buttock wound continues to improve in terms of open areas Left mid tibia is still concerning amount of swelling he is using bacitracin Dorsal left first toe somewhat smaller Right dorsal foot somewhat smaller 7/25; left buttock wound actually continues to improve Left mid tibia area has less swelling. I gave him all my samples of new Nuzyra. This seems to have helped although the wound is still open it. His abrasion closed by here Left dorsal great toe really no better. Still [name] very nonviable surface Right dorsal foot perhaps some better. We have been using bacitracin and silver nitrate to the areas on his lower legs and Hydrofera Blue to the area on the buttock. 8/16 Disappointed that his left buttock wound is actually more substantial. Apparently during the last nurse visit these were both very small. He has continued irritation to [name] large area of skin on his buttock. I have never been able to totally explain this although I think it some combination of the way he sits, pressure, moisture. He is not incontinent enough to contribute to this. Left dorsal great toe still fibrinous debris  on the surface that I have debrided today Large area across the dorsal right toes. The area on the left anterior mid tibia has less swelling. He completed the [place]. This does not look infected although the tissue is still fried 8/30; 2-week follow-up. Left buttock areas not improved. We used Hydrofera Blue on this. Weeping wet with the surrounding erythema that I have not been able to control even with Lotrisone and topical Coloplast Left dorsal great toe looks about the same More substantial area again at the base of his toes on the left which is new this week. Area across the dorsal right toes looks improved The left anterior mid tibia looks like it is trying to close 9/13; 2-week follow-up. Using silver alginate on all of his wounds. The left dorsal foot does not look any better. He has the area on the dorsal toe and also the areas at the base of all of his toes 1 through 3.  On the right foot he has [name] similar pattern in [name] similar area. He has the area on his left mid tibia that looks fairly healthy. Finally the area on his left buttock looks somewhat bette 9/20; culture I did of the left foot which was [name] deep tissue culture last time showed E. coli he has erythema around this wound. Still [name] completely necrotic surface. His right dorsal foot looks about the same. He has [name] very friable surface to the left anterior mid tibia. Both buttock wounds look better. We have been using silver alginate to all wounds 10/4; he has completed the cephalexin that I gave him last time for the left foot. He is using topical gentamicin under silver alginate silver alginate being applied to all the wounds. Unfortunately all the wounds look irritated on his dorsal right foot dorsal left foot left mid tibia. I wonder if this could be [name] silver allergy. I am going to change him to [facility] on the lower extremity. The skin on the left buttock and left posterior thigh still flaking dry and irritated. This has  continued no matter what I have applied topically to this. He has [name] solitary open wound which by itself does not look too bad however the entire area of surrounding skin does not change no matter what we have applied here 10/18; the area on the left dorsal foot and right dorsal foot both look better. The area on the right extends into the plantar but not between his toes. We have been using silver alginate. He still has [name] rectangular erythematous area around the area on the left tibia. The wound itself is very small. Finally everything on his left buttock looks [name] little larger the skin is erythematous 11/15; patient comes in with the left dorsal and right dorsal foot distally looking somewhat better. Still nonviable surface on the left foot which required debridement. He still has the area on the left anterior mid tibia although this looks somewhat better. He has [name] new area on the right lateral lower leg just above the ankle. Finally his left buttock looks terrible with multiple superficial open wounds geometric square shaped area of chronic erythema which I have not been able to sort through 11/29; right dorsal foot and left dorsal foot both look somewhat better. No debridement required. He has the fragile area on the left anterior mid tibia this looks and continues to look somewhat better. Right lateral lower leg just above the ankle we identified last time also looks better. In general the area on his left buttock looks improved. We are using Hydrofera Blue to all wound areas 12/13; right dorsal foot looks better. The area on the right lateral leg is healed. Left dorsal foot has 2 open areas both of which require debridement. The fragile area on the left anterior mid tibial looks better. Smaller area on his buttocks. Were using Hydrofera Blue 1/10; patient comes in with everything looking slightly larger and/or worse. This includes his left buttock, reopening of the left mid tibia, larger areas on  the left dorsal foot and what looks to be [name] cellulitis on the right dorsal and plantar foot. We have been using Hydrofera Blue on all wounds. 1/17; right dorsal foot distally looks better today. The left foot has 2 open wounds that are about the same surrounding erythema. 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 No malignancy  they did not stain for fungus 1/24; his right distal foot is not closed dry and scaly but the wound looks like it is contracting. I did not debride anything here. Problem on his left dorsal foot with expanding erythema. Apparently there were problems last week getting the Elesa Hacker however it is now available at the [employer] but [name] week later. He is using ketoconazole and Coloplast to the left buttock along with Hydrofera Blue this actually looks quite [name] bit better today. 1/31; right dorsal foot again is dry and scaly but looking to contract. He has been using [name] moisturizer on his feet at my request but he is not sure which 1. The left dorsal foot wounds look about the same there is erythema here that I marked last week however after course of Nuzyra it certainly is not any better but not any worse either. Finally on his left buttock the skin continues to look better he has the original wound but [name] new substantial area towards the gluteal cleft. Almost like [name] skin tear. I used scissors to remove skin and subcutaneous tissue here silver nitrate and direct pressure 2/7; right dorsal foot. This does not look too much different from last week. Some erythema skin dry and scaly. No debridement. Left dorsal great toe again still not much improvement. I did remove flaking dry skin and callus from around the edge. Finally on his left buttock. The skin is somewhat better in the periwound. Surface wounds are superficial somewhat better than last  week. 01/26/2022: Is [name] little bit of [name] mystery as to why his wounds fail to respond to treatments and actually seem to get worse. This is my first encounter with this patient. He was previously followed by Dr. Dellia Nims. Based upon my review of the chart, it seems that there is [name] little bit of [name] mystery as to why his wounds do not respond as anticipated to the interventions applied and sometimes even get worse. Biopsies have been performed and he was seen by dermatology in [place], but that did not shed any light on the matter. T oday, his gluteal wound is larger, with substantial drainage, rather malodorous. The food wounds are not terrible, but he has [name] lot of callus and scaly skin around these. He is currently getting silver alginate on the gluteal wound, with idodoflex to the feet. He is using lotrisone on his legs for the dry, scaly skin. 02/09/2022: There has really been no change to any of his wounds. The gluteal wound less drainage and odor, but remains about the same size, the periwound skin remains oddly scaly. His lower extremity wounds also appear roughly the same size. They continue to accumulate [name] small amount of slough. The periwound on his feet and ankle wounds has dry eschar and loose dead skin. We have been using silver alginate on the gluteal wound and Iodoflex on his feet and ankle wounds. T the periwound around his gluteal lesion and Lotrisone on his feet and legs. o 02/23/2022: The right plantar foot wound is closed. The gluteal site looks small but has continued to produce hypertrophic granulation tissue. The foot wounds all look about the same on the dorsal surface of the right foot; on the left, there is only [name] small open area at the site of where his left great toenail would have been. 03/16/2022: The right ankle wound is healed. The right dorsal foot wound is about the same. The left dorsal foot wound is quite [name] bit smaller  and the ischial wound is nearly closed. 03/30/2022: The  right ankle wound reopened. Both dorsal foot wounds are quite [name] bit smaller. Unfortunately, he appears to have sheared part of his ischial wound open further, perhaps during [name] transfer. 04/13/2022: The right ankle wound has hypertrophic granulation tissue present. The dorsal foot wounds continue to decrease in size. The ischial wound looks about the same today, no better, no worse. 04/27/2022: The right ankle wound has closed. Unfortunately, it looks like some moisture got underneath the dry skin on both of his dorsal feet and these wounds have expanded in size. The ischial wound remains the same with perhaps [name] little bit more slough accumulation than at our previous visit. Electronic Signature(s) Signed: 04/27/2022 9:48:23 AM By: Fredirick Maudlin MD FACS Entered By: Fredirick Maudlin on 04/27/2022 09:48:23 -------------------------------------------------------------------------------- Physical Exam Details Patient Name: Date of Service: [name], [name] [name] 04/27/2022 8:15 [name] Medical Record Number: [id number] Patient Account Number: [id number] Date of Birth/Sex: Treating RN: [birth year] (36 y.o. [name] Primary Care Provider: [place], [place] Other Clinician: Referring Provider: Treating Provider/Extender: Graciela Husbands, [name]: 505 [id number] .Tachycardic, asymptomatic.. . . No acute distress. Respiratory Normal work of breathing on room air. Notes 04/27/2022: The dorsal foot wounds are both bigger today with [name] little bit of slough accumulation in small pockmarks. The right ankle wound is closed. The ischial wound has [name] little bit more slough accumulation and is otherwise unchanged. The abrasion on his posterior thigh has healed. Electronic Signature(s) Signed: 04/27/2022 9:49:42 AM By: Fredirick Maudlin MD FACS Entered By: Fredirick Maudlin on 04/27/2022 09:49:42 -------------------------------------------------------------------------------- Physician Orders  Details Patient Name: Date of Service: Karpowicz, [name] [name] 04/27/2022 8:15 [name] Medical Record Number: [id number] Patient Account Number: [id number] Date of Birth/Sex: Treating RN: [birth year] (36 y.o. 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 - Dr Celine Ahr - Room 1 - Tuesday 6/20 at 8:15 Return [name] Bathing/ Shower/ Hygiene May shower and wash wound with soap and water. - on days that dressing is changed Edema Control - Lymphedema / SCD / Other Elevate legs to the level of the heart or above for 30 minutes daily and/or when sitting, [name] frequency of: - throughout the day Moisturize legs daily. 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 Peri-Wound Care: Zinc Oxide Ointment 30g tube Every Other  Day/30 Days Discharge Instructions: Apply Zinc Oxide to periwound with each dressing change Prim Dressing: KerraCel Ag Gelling Fiber  Dressing, 4x5 in (silver alginate) (Generic) Every Other Day/30 Days ary Discharge Instructions: Apply silver alginate to wound bed as instructed Secondary Dressing: ABD Pad, 8x10 (Generic) Every Other Day/30 Days Discharge Instructions: Apply over primary dressing as directed. Secondary Dressing: Woven Gauze Sponge, Non-Sterile 4x4 in Every Other Day/30 Days Discharge Instructions: Apply over primary dressing as directed. Secured With: 61M Medipore H Soft Cloth Surgical T ape, 4 x 10 (in/yd) Every Other Day/30 Days Discharge Instructions: Secure with tape as directed. Wound #52 - Foot Wound Laterality: Dorsal, Right Cleanser: Soap and Water Every Other Day/30 Days Discharge Instructions: May shower and wash wound with dial antibacterial soap and water prior to dressing change. Peri-Wound Care: Triamcinolone 15 (g) Every Other Day/30 Days Discharge Instructions: Use triamcinolone 15 (g) as directed Peri-Wound Care: Sween Lotion (Moisturizing lotion) Every Other Day/30 Days Discharge Instructions: Apply Aquaphor moisturizing lotion as directed Peri-Wound Care: Lotrisone Every Other Day/30 Days Discharge Instructions: Apply Lotrisone to periwound Prim Dressing: KerraCel Ag Gelling Fiber Dressing, 2x2 in (silver alginate) Every Other Day/30 Days ary Discharge Instructions: Apply silver alginate to wound bed as instructed Secondary Dressing: Woven Gauze Sponge, Non-Sterile 4x4 in Every Other Day/30 Days Discharge Instructions: Apply over primary dressing as directed. Secured With: [employer], 4.5x3.1 (in/yd) (Generic) Every Other Day/30 Days Discharge Instructions: Secure with Kerlix as directed. Secured With: 61M Medipore H Soft Cloth Surgical T ape, 4 x 10 (in/yd) (Generic) Every Other Day/30 Days Discharge Instructions: Secure with tape as  directed. Wound #56 - Foot Wound Laterality: Dorsal, Left Cleanser: Soap and Water Every Other Day/30 Days Discharge Instructions: May shower and wash wound with dial antibacterial soap and water prior to dressing change. Peri-Wound Care: Triamcinolone 15 (g) Every Other Day/30 Days Discharge Instructions: Use triamcinolone 15 (g) as directed Peri-Wound Care: Sween Lotion (Moisturizing lotion) Every Other Day/30 Days Discharge Instructions: Apply Aquaphor moisturizing lotion as directed Peri-Wound Care: Lotrisone Every Other Day/30 Days Discharge Instructions: Apply Lotrisone to periwound Prim Dressing: KerraCel Ag Gelling Fiber Dressing, 2x2 in (silver alginate) Every Other Day/30 Days ary Discharge Instructions: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 much progress very dry scaly skin over the area. Also the area at the base of the left first and second toe in [name] similar condition 05/12/17; continued gradual improvement in the refractory left lateral venous insufficiency wound on the left. Dimension smaller. Surface still requiring debridement using Hydrofera Blue 05/19/17; continued gradual improvement in the refractory left lateral venous ulceration. Careful inspection of the wound bed underlying rumination suggested some degree of epithelialization over the surface no debridement indicated. Continue Hydrofera Blue difficult areas between his toes first and third on the left than first and second on the right. I'm going to change to silver alginate from silver collagen. Continue ketoconazole as I suspect  underlying tinea pedis 05/26/17; left lateral leg venous insufficiency wound. We've been using Hydrofera Blue. I believe that there is expanding epithelialization over the surface of the wound albeit not coming from the wound circumference. This is [name] bit of an odd situation in which the epithelialization seems to be coming from the surface of the wound rather than in the exact circumference. There is still small open areas mostly along the lateral margin of the wound. ooHe has unchanged areas between the left first and second and the right first second toes which I been treating for tenia pedis 06/02/17; left lateral leg venous insufficiency wound. We have been using Hydrofera Blue. Somewhat smaller from the wound circumference. The surface of the wound remains [name] bit on it almost epithelialized sedation in appearance. I use an open curette today debridement in the surface of all of this especially the edges ooSmall open wounds remaining on the dorsal right first and second toe interspace and the plantar left first second toe and her face on the left 06/09/17; wound on the left lateral leg continues to be smaller but very gradual and very dry surface using Hydrofera Blue 06/16/17 requires weekly debridements now on the left lateral leg although this continues to contract. I changed to silver collagen last week because of dryness of the wound bed. Using Iodoflex to the areas on his first and second toes/web space bilaterally 06/24/17; patient with history of paraplegia also chronic venous insufficiency with lymphedema. Has [name] very difficult wound on the left lateral leg. This has been gradually reducing in terms of with but comes in with [name] very dry adherent surface. High switch to silver collagen [name] week or so ago with hydrogel to keep the area moist. This is been refractory to multiple dressing attempts. He also has areas in his first and second toes bilaterally in the anterior and posterior web space. I had  been using Iodoflex here after [name] prolonged course of silver alginate with ketoconazole was ineffective [question tinea pedis] 07/14/17; patient arrives today with [name] very difficult adherent material over his left lateral lower leg wound. He also has surrounding erythema and poorly controlled edema. He was switched his Santyl last visit  which the nurses are applying once during his doctor visit and once on [name] nurse visit. He was also reduced to 2 layer compression I'm not exactly sure of the issue here. 07/21/17; better surface today after 1 week of Iodoflex. Significant cellulitis that we treated last week also better. [Doxycycline] 07/28/17 better surface today with now 2 weeks of Iodoflex. Significant cellulitis treated with doxycycline. He has now completed the doxycycline and he is back to his usual degree of chronic venous inflammation/stasis dermatitis. He reminds me he has had ablations surgery here 08/04/17; continued improvement with Iodoflex to the left lateral leg wound in terms of the surface of the wound although the dimensions are better. 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 Problematic areas on the plantar aspect of the first second toe web space on the left and the dorsal aspect of the first second toe web space on the right. At one point I felt these were probably related to chronic fungal infections in treated him aggressively for this although we have not made any improvement here. 08/11/17; left lateral leg. Surface continues to improve with the Iodoflex although we are not seeing much improvement in overall wound dimensions. Areas on his plantar left foot and right foot show no improvement. In fact the right foot looks somewhat worse 08/18/17; left lateral leg. We changed to [facility] Blue last week after [name] prolonged course of Iodoflex which helps get the surface better. It appears that the wound with is improved.  Continue with difficult areas on the left dorsal first second and plantar first second on the right 09/01/17; patient arrives in clinic today having had [name] temperature of 103 yesterday. He was seen in the ER and [facility]. The patient was concerned he could have cellulitis again in the right leg however they diagnosed him with [name] UTI and he is now on Keflex. He has [name] history of cellulitis which is been recurrent and difficult but this is been in the left leg, in the past 5 use doxycycline. He does in and out catheterizations at home which are risk factors for UTI 09/08/17; patient will be completing his Keflex this weekend. The erythema on the left leg is considerably better. He has [name] new wound today on the medial part of the right leg small superficial almost looks like [name] skin tear. He has worsening of the area on the right dorsal first and second toe. His major area on the left lateral leg is better. Using Hydrofera Blue on all areas 09/15/17; gradual reduction in width on the long wound in the left lateral leg. No debridement required. He also has wounds on the plantar aspect of his left first second toe web space and on the dorsal aspect of the right first second toe web space. 09/22/17; there continues to be very gradual improvements in the dimensions of the left lateral leg wound. He hasn't round erythematous spot with might be pressure on his wheelchair. There is no evidence obviously of infection no purulence no warmth ooHe has [name] dry scaled area on the plantar aspect of the left first second toe ooImproved area on the dorsal right first second toe. 09/29/17; left lateral leg wound continues to improve in dimensions mostly with an is still [name] fairly long but increasingly narrow wound. ooHe has [name] dry scaled area on the plantar aspect of his left first second toe web space ooIncreasingly concerning area on the dorsal right first second  toe. In fact I am concerned today about possible cellulitis  around this wound. The areas extending up his second toe and although there is deformities here almost appears to abut on the nailbed. 10/06/17; left lateral leg wound continues to make very gradual progress. Tissue culture I did from the right first second toe dorsal foot last time grew MRSA and enterococcus which was vancomycin sensitive. This was not sensitive to clindamycin or doxycycline. He is allergic to Zyvox and sulfa we have therefore arrange for him to have dalvance infusion tomorrow. He is had this in the past and tolerated it well 10/20/17; left lateral leg wound continues to make decent progress. This is certainly reduced in terms of with there is advancing epithelialization.ooThe cellulitis in the right foot looks better although he still has [name] deep wound in the dorsal aspect of the first second toe web space. Plantar left first toe web space on the left I think is making some progress 10/27/17; left lateral leg wound continues to make decent progress. Advancing epithelialization.using Hydrofera Blue ooThe right first second toe web space wound is better-looking using silver alginate ooImprovement in the left plantar first second toe web space. Again using silver alginate 11/03/17 left lateral leg wound continues to make decent progress albeit slowly. Using Hydrofera Blue ooThe right per second toe web space continues to be [name] very problematic looking punched out wound. I obtained [name] piece of tissue for deep culture I did extensively treated this for fungus. It is difficult to imagine that this is [name] pressure area as the patient states other than going outside he doesn't really wear shoes at home ooThe left plantar first second toe web space looked fairly senescent. Necrotic edges. This required debridement oochange to Hydrofera Blue to all wound areas 11/10/17; left lateral leg wound continues to contract. Using Hydrofera Blue ooOn the right dorsal first second toe web space  dorsally. Culture I did of this area last week grew MRSA there is not an easy oral option in this patient was multiple antibiotic allergies or intolerances. This was only [name] rare culture isolate I'm therefore going to use Bactroban under silver alginate ooOn the left plantar first second toe web space. Debridement is required here. This is also unchanged 11/17/17; left lateral leg wound continues to contract using Hydrofera Blue this is no longer the major issue. ooThe major concern here is the right first second toe web space. He now has an open area going from dorsally to the plantar aspect. There is now wound on the inner lateral part of the first toe. Not [name] very viable surface on this. There is erythema spreading medially into the forefoot. ooNo major change in the left first second toe plantar wound 11/24/17; left lateral leg wound continues to contract using Hydrofera Blue. Nice improvement today ooThe right first second toe web space all of this looks [name] lot less angry than last week. I have given him clindamycin and topical Bactroban for MRSA and terbinafine for the possibility of underlining tinea pedis that I could not control with ketoconazole. Looks somewhat better ooThe area on the plantar left first second toe web space is weeping with dried debris around the wound 12/01/17; left lateral leg wound continues to contract he Hydrofera Blue. It is becoming thinner in terms of with nevertheless it is making good improvement. ooThe right first second toe web space looks less angry but still [name] large necrotic-looking wounds starting on the plantar aspect of the right foot extending between  the toes and now extensively on the base of the right second toe. I gave him clindamycin and topical Bactroban for MRSA anterior benefiting for the possibility of underlying tinea pedis. Not looking better today ooThe area on the left first/second toe looks better. Debrided of necrotic debris 12/05/17* the  patient was worked in urgently today because over the weekend he found blood on his incontinence bad when he woke up. He was found to have an ulcer by his wife who does most of his wound care. He came in today for [place] to look at this. He has not had [name] history of wounds in his buttocks in spite of his paraplegia. 12/08/17; seen in follow-up today at his usual appointment. He was seen earlier this week and found to have [name] new wound on his buttock. We also follow him for wounds on the left lateral leg, left first second toe web space and right first second toe web space 12/15/17; we have been using Hydrofera Blue to the left lateral leg which has improved. The right first second toe web space has also improved. Left first second toe web space plantar aspect looks stable. The left buttock has worsened using Santyl. Apparently the buttock has drainage 12/22/17; we have been using Hydrofera Blue to the left lateral leg which continues to improve now 2 small wounds separated by normal skin. He tells [place] he had [name] fever up to 100 yesterday he is prone to UTIs but has not noted anything different. He does in and out catheterizations. The area between the first and second toes today does not look good necrotic surface covered with what looks to be purulent drainage and erythema extending into the third toe. I had gotten this to something that I thought look better last time however it is not look good today. He also has [name] necrotic surface over the buttock wound which is expanded. I thought there might be infection under here so I removed [name] lot of the surface with [name] #5 curet though nothing look like it really needed culturing. He is been using Santyl to this area 12/27/17; his original wound on the left lateral leg continues to improve using Hydrofera Blue. I gave him samples of Baxdella although he was unable to take them out of fear for an allergic reaction ["lump in his throat"].the culture I did of the purulent  drainage from his second toe last week showed both enterococcus and [name] set Enterobacter I was also concerned about the erythema on the bottom of his foot although paradoxically although this looks somewhat better today. 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 We've been using silver alginate here. They came up today that he sleeps in [name] recliner, I'm not sure why but I asked him to stop this 01/03/18; his original wound we've been using Hydrofera Blue is now separated into 2 areas. ooUlcer on his left buttock is better he is off the recliner and sleeping in bed ooFinally both wound areas between his first and second toes also looks some better 01/10/18; his original wound on the left lateral leg is now separated into 2 wounds we've been using Hydrofera Blue ooUlcer on his left buttock has some drainage. There is [name] small probing site going into muscle layer superiorly.using silver alginate -He arrives today with [name] deep tissue injury on the left heel ooThe wound on the dorsal aspect of his first second  toe on the left looks [name] lot betterusing silver alginate ketoconazole ooThe area on the first second toe web space on the right also looks [name] lot bette 01/17/18; his original wound on the left lateral leg continues to progress using Hydrofera Blue ooUlcer on his left buttock also is smaller surface healthier except for [name] small probing site going into the muscle layer superiorly. 2.4 cm of tunneling in this area ooDTI on his left heel we have only been offloading. Looks better than last week no threatened open no evidence of infection oothe wound on the dorsal aspect of the first second toe on the left continues to look like it's regressing we have only been using silver alginate and terbinafine orally ooThe area in the first second toe web space on the right also looks to be [name] lot better using silver  alginate and terbinafine I think this was prompted by tinea pedis 01/31/18; the patient was hospitalized in [place] last week apparently for [name] complicated UTI. He was discharged on cefepime he does in and out catheterizations. In the hospital he was discovered M I don't mild elevation of AST and ALT and the terbinafine was stopped.predictably the pressure ulcer on s his buttock looks betterusing silver alginate. The area on the left lateral leg also is better using Hydrofera Blue. The area between the first and second toes on the left better. First and second toes on the right still substantial but better. Finally the DTI on the left heel has held together and looks like it's resolving 02/07/18-he is here in follow-up evaluation for multiple ulcerations. He has new injury to the lateral aspect of the last issue [name] pressure ulcer, he states this is from adhesive removal trauma. He states he has tried multiple adhesive products with no success. All other ulcers appear stable. The left heel DTI is resolving. We will continue with same treatment plan and follow-up next week. 02/14/18; follow-up for multiple areas. ooHe has [name] new area last week on the lateral aspect of his pressure ulcer more over the posterior trochanter. The original pressure ulcer looks quite stable has healthy granulation. 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 Almost fully epithelialized on the original superior area using Hydrofera Blue ooDTI on the left heel has peeled off this week to reveal [name] small superficial wound under denuded skin and subcutaneous tissue ooBoth areas between the first and second toes look better including nothing open on the left 02/21/18; ooThe patient's wounds on his left ischial tuberosity and posterior left greater trochanter actually looked better. He has [name] large area of irritation around the  area which I think is contact dermatitis. I am doubtful that this is fungal ooHis original wound on the left lateral calf continues to improve we have been using Hydrofera Blue ooThere is no open area in the left first second toe web space although there is [name] lot of thick callus ooThe DTI on the left heel required debridement today of necrotic surface eschar and subcutaneous tissue using silver alginate ooFinally the area on the right first second toe webspace continues to contract using silver alginate and ketoconazole 02/28/18 ooLeft ischial tuberosity wounds look better using silver alginate. ooOriginal wound on the left calf only has one small open area left using Hydrofera Blue ooDTI on the left heel required debridement mostly removing skin from around this wound surface. Using silver alginate ooThe areas on the  right first/second toe web space using silver alginate and ketoconazole 03/08/18 on evaluation today patient appears to be doing decently well as best I can tell in regard to his wounds. This is the first time that I have seen him as he generally is followed by Dr. Dellia Nims. With that being said none of his wounds appear to be infected he does have an area where there is some skin covering what appears to be [name] new wound on the left dorsal surface of his great toe. This is right at the nail bed. With that being said I do believe that debrided away some of the excess skin can be of benefit in this regard. Otherwise he has been tolerating the dressing changes without complication. 03/14/18; patient arrives today with the multiplicity of wounds that we are following. He has not been systemically unwell ooOriginal wound on the left lateral calf now only has 2 small open areas we've been using Hydrofera Blue which should continue ooThe deep tissue injury on the left heel requires debridement today. We've been using silver alginate ooThe left first second toe and the right first second  toe are both are reminiscence what I think was tinea pedis. Apparently some of the callus Surface between the toes was removed last week when it started draining. ooPurulent drainage coming from the wound on the ischial tuberosity on the left. 03/21/18-He is here in follow-up evaluation for multiple wounds. 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 He tolerated debridement. The only change to last week's recommendations is to discontinue antifungal cream between toes. He will follow-up next week 03/28/18; following up for multiple wounds;Concern this week is streaking redness and swelling in the right foot. He is going to need antibiotics for this. 03/31/18; follow-up for right foot cellulitis. Streaking redness and swelling in the right foot on 03/28/18. 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 I put him on clindamycin 300 mg every 6 and brought him in for [name] quick check. He has an open wound between his first and second toes on the right foot as [name] potential source. 04/04/18; ooRight foot cellulitis is resolving he is completing clindamycin. This is truly good news ooLeft lateral calf wound which is initial wound only has one small open area inferiorly this is close to healing out. He has compression stockings. We will use Hydrofera Blue right down to the epithelialization of this ooNonviable surface on the left heel which was initially pressure with [name] DTI. We've been using Hydrofera Blue. I'm going to switch this back to silver alginate ooLeft first second toe/tinea pedis this looks better using silver alginate ooRight first second toe tinea pedis using silver alginate ooLarge pressure ulcers on theLeft ischial tuberosity. Small wound here Looks better. I am uncertain about the surface over the large wound. Using silver alginate 04/11/18; ooCellulitis in the right foot is resolved ooLeft lateral  calf wound which was his original wounds still has 2 tiny open areas remaining this is just about closed ooNonviable surface on the left heel is better but still requires debridement ooLeft first second toe/tinea pedis still open using silver alginate ooRight first second toe wound tinea pedis I asked him to go back to using ketoconazole and silver alginate ooLarge pressure ulcers on the left ischial tuberosity this shear injury here is resolved. Wound is smaller. No evidence of infection using silver alginate 04/18/18; ooPatient arrives with an intense area of cellulitis in the  right mid lower calf extending into the right heel area. Bright red and warm. Smaller area on the left anterior leg. He has [name] significant history of MRSA. He will definitely need antibioticsoodoxycycline ooHe now has 2 open areas on the left ischial tuberosity the original large wound and now [name] satellite area which I think was above his initial satellite areas. Not [name] wonderful surface on this satellite area surrounding erythema which looks like pressure related. ooHis left lateral calf wound again his original wound is just about closed ooLeft heel pressure injury still requiring debridement ooLeft first second toe looks [name] lot better using silver alginate ooRight first second toe also using silver alginate and ketoconazole cream also looks better 04/20/18; the patient was worked in early today out of concerns with his cellulitis on the right leg. I had started him on doxycycline. This was 2 days ago. His wife was concerned about the swelling in the area. Also concerned about the left buttock. He has not been systemically unwell no fever chills. No nausea vomiting or diarrhea 04/25/18; the patient's left buttock wound is continued to deteriorate he is using Hydrofera Blue. He is still completing clindamycin for the cellulitis on the right leg although all of this looks better. 05/02/18 ooLeft buttock wound still  with [name] lot of drainage and [name] very tightly adherent fibrinous necrotic surface. He has [name] deeper area superiorly ooThe left lateral calf wound is still closed ooDTI wound on the left heel necrotic surface especially the circumference using Iodoflex ooAreas between his left first second toe and right first second toe both look better. Dorsally and the right first second toe he had [name] necrotic surface although at smaller. In using silver alginate and ketoconazole. I did [name] culture last week which was [name] deep tissue culture of the reminiscence of the open wound on the right first second toe dorsally. This grew [name] few Acinetobacter and [name] few methicillin-resistant staph aureus. Nevertheless the area actually this week looked better. I didn't feel the need to specifically address this at least in terms of systemic antibiotics. 05/09/18; wounds are measuring larger more drainage per our intake. We are using Santyl covered with alginate on the large superficial buttock wounds, Iodosorb on the left heel, ketoconazole and silver alginate to the dorsal first and second toes bilaterally. 05/16/18; ooThe area on his left buttock better in some aspects although the area superiorly over the ischial tuberosity required an extensive debridement.using Santyl ooLeft heel appears stable. Using Iodoflex ooThe areas between his first and second toes are not bad however there is spreading erythema up the dorsal aspect of his left foot this looks like cellulitis again. He is insensate the erythema is really very brilliant.o Erysipelas He went to see an allergist days ago because he was itching part of this he had lab work done. This showed [name] white count of 15.1 with 70% neutrophils. Hemoglobin of 11.4 and [name] platelet count of 659,000. Last white count we had in [facility] was [name] 2-1/2 years ago which was 25.9 but he was ill at the time. He was able to show me some lab work that was done by his primary physician the pattern is about  the same. 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 But prompted me to go ahead and do x-rays of both feet and the pelvis rule out osteomyelitis. He also had [name] comprehensive metabolic panel this was reasonably normal his albumin was 3.7 liver  function tests BUN/creatinine all normal 05/23/18; x-rays of both his feet from last week were negative for underlying pulmonary abnormality. The x-ray of his pelvis however showed mild irregularity in the left ischial which may represent some early osteomyelitis. The wound in the left ischial continues to get deeper clearly now exposed muscle. Each week necrotic surface material over this area. Whereas the rest of the wounds do not look so bad. ooThe left ischial wound we have been using Santyl and calcium alginate ooT the left heel surface necrotic debris using Iodoflex o ooThe left lateral leg is still healed ooAreas on the left dorsal foot and the right dorsal foot are about the same. There is some inflammation on the left which might represent contact dermatitis, fungal dermatitis I am doubtful cellulitis although this looks better than last week 05/30/18; CT scan done at Hospital did not show any osteomyelitis or abscess. Suggested the possibility of underlying cellulitis although I don't see [name] lot of evidence of this at the bedside ooThe wound itself on the left buttock/upper thigh actually looks somewhat better. No debridement ooLeft heel also looks better no debridement continue Iodoflex ooBoth dorsal first second toe spaces appear better using Lotrisone. Left still required debridement 06/06/18; ooIntake reported some purulent looking drainage from the left gluteal wound. Using Santyl and calcium alginate ooLeft heel looks better although still [name] nonviable surface requiring debridement ooThe left dorsal foot first/second webspace actually expanding and somewhat deeper. I may consider doing [name] shave  biopsy of this area ooRight dorsal foot first/second webspace appears stable to improved. Using Lotrisone and silver alginate to both these areas 06/13/18 ooLeft gluteal surface looks better. Now separated in the 2 wounds. No debridement required. Still drainage. We'll continue silver alginate ooLeft heel continues to look better with Iodoflex continue this for at least another week ooOf his dorsal foot wounds the area on the left still has some depth although it looks better than last week. We've been using Lotrisone and silver alginate 06/20/18 ooLeft gluteal continues to look better healthy tissue ooLeft heel continues to look better healthy granulation wound is smaller. He is using Iodoflex and his long as this continues continue the Iodoflex ooDorsal right foot looks better unfortunately dorsal left foot does not. There is swelling and erythema of his forefoot. He had minor trauma to this several days ago but doesn't think this was enough to have caused any tissue injury. Foot looks like cellulitis, we have had this problem before 06/27/18 on evaluation today patient appears to be doing [name] little worse in regard to his foot ulcer. 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 Both of which would really be his only options for treating this infection. In the past he has been given and effusion of Orbactiv. This is done very well for him in the past again it's one time dosing IV antibiotic therapy. Subsequently I do believe this is something we're gonna need to see about doing at this point in time. Currently his other wounds seem to be doing somewhat better in my pinion I'm pretty happy in that regard. 07/03/18 on evaluation today patient's wounds actually appear to be doing fairly well. He has been tolerating the dressing changes without complication. All in  all he seems to be showing signs of improvement. In regard to the antibiotics he has been dealing with infectious disease  since I saw him last week as far as getting this scheduled. In the end [facility]'s [facility] this coming Friday. In the meantime he has been continuing to perform the dressing changes in such as previous. There does not appear to be any evidence of infection worsengin at this time. 07/10/18; ooSince I last saw this man 2 weeks ago things have actually improved. IV antibiotics of resulted in less forefoot erythema although there is still some present. He is not systemically unwell ooLeft buttock wounds o2 now have no depth there is increased epithelialization Using silver alginate ooLeft heel still requires debridement using Iodoflex ooLeft dorsal foot still with [name] sizable wound about the size of [name] border but healthy granulation ooRight dorsal foot still with [name] slitlike area using silver alginate 07/18/18; the patient's cellulitis in the left foot is improved in fact I think it is on its way to resolving. ooLeft buttock wounds o2 both look better although the larger one has hypertension granulation we've been using silver alginate ooLeft heel has some thick circumferential redundant skin over the wound edge which will need to be removed today we've been using Iodoflex ooLeft dorsal foot is still [name] sizable wound required debridement using silver alginate ooThe right dorsal foot is just about closed only [name] small open area remains here 07/25/18; left foot cellulitis is resolved ooLeft buttock wounds o2 both look better. Hyper-granulation on the major area ooLeft heel as some debris over the surface but otherwise looks [name] healthier wound. Using silver collagen ooRight dorsal foot is just about closed 07/31/18; arrives with our intake nurse worried about purulent drainage from the buttock. We had hyper-granulation here  last week ooHis buttock wounds o2 continue to look better ooLeft heel some debris over the surface but measuring smaller. ooRight dorsal foot unfortunately has openings between the toes ooLeft foot superficial wound looks less aggravated. 08/07/18 ooButtock wounds continue to look better although some of her granulation and the larger medial wound. silver alginate ooLeft heel continues to look [name] lot better.silver collagen ooLeft foot superficial wound looks less stable. Requires debridement. He has [name] new wound superficial area on the foot on the lateral dorsal foot. ooRight foot looks better using silver alginate without Lotrisone 08/14/2018; patient was in the ER last week diagnosed with [name] UTI. He is now on Cefpodoxime and Macrodantin. ooButtock wounds continued to be smaller. Using silver alginate ooLeft heel continues to look better using silver collagen ooLeft foot superficial wound looks as though it is improving ooRight dorsal foot area is just about healed. 08/21/2018; patient is completed his antibiotics for his UTI. ooHe has 2 open areas on the buttocks. There is still not closed although the surface looks satisfactory. Using silver alginate ooLeft heel continues to improve using silver collagen ooThe bilateral dorsal foot areas which are at the base of his first and second toes/possible tinea pedis are actually stable on the left but worse on the right. The area on the left required debridement of necrotic surface. After debridement I obtained [name] specimen for PCR culture. ooThe right dorsal foot which is been just about healed last week is now reopened 08/28/2018; culture done on the left dorsal foot showed coag negative staph both staph epidermidis and Lugdunensis. I think this is worthwhile initiating systemic treatment. I will use doxycycline given his long list of allergies. The area on the left heel slightly improved but still requiring debridement. ooThe large  wound on the buttock is just about closed whereas the smaller one is larger. Using silver alginate in this area 09/04/2018; patient is completing his doxycycline for the left foot although this continues to be [name] very difficult wound area with very adherent necrotic debris. We are using silver alginate to all his wounds right foot left foot and the small wounds on his buttock, silver collagen on the left heel. 09/11/2018; once again this patient has intense erythema and swelling of the left forefoot. Lesser degrees of erythema in the right foot. He has [name] long list of allergies and intolerances. I will reinstitute doxycycline. 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 Using silver alginate ooLeft heel also looks better using silver collagen ooUnfortunately both the areas on his feet look worse. The area on the left first second webspace is now gone through to the plantar part of his foot. The area on the left foot anteriorly is irritated with erythema and swelling in the forefoot. 09/25/2018 ooHis wound on the left plantar heel looks better. Using silver collagen ooThe area on the left buttock 2 small remnant areas. One is closed one is still open. Using silver alginate ooThe areas between both his first and second toes look worse. This in spite of long-standing antifungal therapy with ketoconazole and silver alginate which should have antifungal activity ooHe has small areas around his original wound on the left calf one is on the bottom of the original scar tissue and one superiorly both of these are small and superficial but again given wound history in this site this is worrisome 10/02/2018 ooLeft plantar heel continues to gradually contract using silver collagen ooLeft buttock wound is unchanged using silver alginate ooThe areas on his dorsal feet between his first and second toes bilaterally look about the same. I prescribed  clindamycin ointment to see if we can address chronic staph colonization and also the underlying possibility of erythrasma ooThe left lateral lower extremity wound is actually on the lateral part of his ankle. Small open area here. We have been using silver alginate 10/09/2018; ooLeft plantar heel continues to look healthy and contract. No debridement is required ooLeft buttock slightly smaller with [name] tape injury wound just below which was new this week ooDorsal feet somewhat improved I have been using clindamycin ooLeft lateral looks lower extremity the actual open area looks worse although [name] lot of this is epithelialized. I am going to change to silver collagen today He has [name] lot more swelling in the right leg although this is not pitting not red and not particularly warm there is [name] lot of spasm in the right leg usually indicative of people with paralysis of some underlying discomfort. We have reviewed his vascular status from 2017 he had [name] left greater saphenous vein ablation. I wonder about referring him back to vascular surgery if the area on the left leg continues to deteriorate. 10/16/2018 in today for follow-up and management of multiple lower extremity ulcers. His left Buttock wound is much lower smaller and almost closed completely. The wound to the left ankle has began to reopen with Epithelialization and some adherent slough. He has multiple new areas to the left foot and leg. The left dorsal foot without much improvement. Wound present between left great webspace and 2nd toe. Erythema and edema present right leg. Right LE ultrasound obtained on 10/10/18 was negative for DVT . 10/23/2018; ooLeft buttock is closed over. Still dry macerated  skin but there is no open wound. I suspect this is chronic pressure/moisture ooLeft lateral calf is quite [name] bit worse than when I saw this last. There is clearly drainage here he has macerated skin into the left plantar heel. We will change the  primary dressing to alginate ooLeft dorsal foot has some improvement in overall wound area. Still using clindamycin and silver alginate ooRight dorsal foot about the same as the left using clindamycin and silver alginate ooThe erythema in the right leg has resolved. He is DVT rule out was negative ooLeft heel pressure area required debridement although the wound is smaller and the surface is health 10/26/2018 ooThe patient came back in for his nurse check today predominantly because of the drainage coming out of the left lateral leg with [name] recent reopening of his original wound on the left lateral calf. He comes in today with [name] large amount of surrounding erythema around the wound extending from the calf into the ankle and even in the area on the dorsal foot. He is not systemically unwell. He is not febrile. Nevertheless this looks like cellulitis. We have been using silver alginate to the area. I changed him to [name] regular visit and I am going to prescribe him doxycycline. The rationale here is [name] long list of medication intolerances and [name] history of MRSA. I did not see anything that I thought would provide [name] valuable culture 10/30/2018 ooFollow-up from his appointment 4 days ago with really an extensive area of cellulitis in the left calf left lateral ankle and left dorsal foot. I put him on doxycycline. He has [name] long list of medication allergies which are true allergy reactions. 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 In any case he is [name] lot better today. The erythema especially in the anterior and lateral left calf is better. He still has left ankle erythema. He also is complaining about increasing edema in the right leg we have only been using Kerlix Coban and he has been doing the wraps at home. Finally he has [name] spotty rash on the medial part of his upper left calf which looks like folliculitis or perhaps wrap occlusion  type injury. Small superficial macules not pustules 11/06/18 patient arrives today with again [name] considerable degree of erythema around the wound on the left lateral calf extending into the dorsal ankle and dorsal foot. This is [name] lot worse than when I saw this last week. He is on doxycycline really with not [name] lot of improvement. He has not been systemically unwell Wounds on the; left heel actually looks improved. Original area on the left foot and proximity to the first and second toes looks about the same. He has superficial areas on the dorsal foot, anterior calf and then the reopening of his original wound on the left lateral calf which looks about the same ooThe only area he has on the right is the dorsal webspace first and second which is smaller. ooHe has [name] large area of dry erythematous skin on the left buttock small open area here. 11/13/2018; the patient arrives in much better condition. The erythema around the wound on the left lateral calf is [name] lot better. Not sure whether this was the clindamycin or the TCA and ketoconazole or just in the improvement in edema control [stasis dermatitis]. In any case this is [name] lot better. The area on the left heel is very small and just about resolved  using silver collagen we have been using silver alginate to the areas on his dorsal feet 11/20/2018; his wounds include the left lateral calf, left heel, dorsal aspects of both feet just proximal to the first second webspace. He is stable to slightly improved. I did not think any changes to his dressings were going to be necessary 11/27/2018 he has [name] reopening on the left buttock which is surrounded by what looks like tinea or perhaps some other form of dermatitis. The area on the left dorsal foot has some erythema around it I have marked this area but I am not sure whether this is cellulitis or not. Left heel is not closed. Left calf the reopening is really slightly longer and probably worse 1/13; in general  things look better and smaller except for the left dorsal foot. Area on the left heel is just about closed, left buttock looks better only [name] small wound remains in the skin looks better [using Lotrisone] 1/20; the area on the left heel only has [name] few remaining open areas here. Left lateral calf about the same in terms of size, left dorsal foot slightly larger right lateral foot still not closed. The area on the left buttock has no open wound and the surrounding skin looks [name] lot better 1/27; the area on the left heel is closed. Left lateral calf better but still requiring extensive debridements. The area on his left buttock is closed. He still has the open areas on the left dorsal foot which is slightly smaller in the right foot which is slightly expanded. We have been using Iodoflex on these areas as well 2/3; left heel is closed. Left lateral calf still requiring debridement using Iodoflex there is no open area on his left buttock however he has dry scaly skin over [name] large area of this. Not really responding well to the Lotrisone. Finally the areas on his dorsal feet at the level of the first second webspace are slightly smaller on the right and about the same on the left. Both of these vigorously debrided with Anasept and gauze 2/10; left heel remains closed he has dry erythematous skin over the left buttock but there is no open wound here. Left lateral leg has come in and with. Still requiring debridement we have been using Iodoflex here. Finally the area on the left dorsal foot and right dorsal foot are really about the same extremely dry callused fissured areas. He does not yet have [name] dermatology appointment 2/17; left heel remains closed. He has [name] new open area on the left buttock. The area on the left lateral calf is bigger longer and still covered in necrotic debris. No major change in his foot areas bilaterally. I am awaiting for [name] dermatologist to look on this. We have been using ketoconazole  I do not know that this is been doing any good at all. 2/24; left heel remains closed. The left buttock wound that was new reopening last week looks better. The left lateral calf appears better also although still requires debridement. The major area on his foot is the left first second also requiring debridement. We have been putting Prisma on all wounds. I do not believe that the ketoconazole has done too much good for his feet. He will use Lotrisone I am going to give him [name] 2-week course of terbinafine. We still do not have [name] dermatology appointment 3/2 left heel remains closed however there is skin over bone in this area I pointed this out to  him today. The left buttock wound is epithelialized but still does not look completely stable. The area on the left leg required debridement were using silver collagen here. With regards to his feet we changed to Lotrisone last week and silver alginate. 3/9; left heel remains closed. Left buttock remains closed. The area on the right foot is essentially closed. The left foot remains unchanged. Slightly smaller on the left lateral calf. Using silver collagen to both of these areas 3/16-Left heel remains closed. Area on right foot is closed. Left lateral calf above the lateral malleolus open wound requiring debridement with easy bleeding. Left dorsal wound proximal to first toe also debrided. Left ischial area open new. Patient has been using Prisma with wrapping every 3 days. Dermatology appointment is apparently tomorrow.Patient has completed his terbinafine 2-week course with some apparent improvement according to him, there is still flaking and dry skin in his foot on the left 3/23; area on the right foot is reopened. The area on the left anterior foot is about the same still [name] very necrotic adherent surface. He still has the area on the left leg and reopening is on the left buttock. He apparently saw dermatology although I do not have [name] note. According  to the patient who is usually fairly well informed they did not have any good ideas. Put him on oral terbinafine which she is been on before. 3/30; using silver collagen to all wounds. Apparently his dermatologist put him on doxycycline and rifampin presumably some culture grew staph. I do not have this result. He remains on terbinafine although I have used terbinafine on him before 4/6; patient has had [name] fairly substantial reopening on the right foot between the first and second toes. He is finished his terbinafine and I believe is on doxycycline and rifampin still as prescribed by dermatology. We have been using silver collagen to all his wounds although the patient reports that he thinks silver alginate does better on the wounds on his buttock. 4/13; the area on his left lateral calf about the same size but it did not require debridement. ooLeft dorsal foot just proximal to the webspace between the first and second toes is about the same. Still nonviable surface. I note some superficial bronze discoloration of the dorsal part of his foot ooRight dorsal foot just proximal to the first and second toes also looks about the same. I still think there may be the same discoloration I noted above on the left ooLeft buttock wound looks about the same 4/20; left lateral calf appears to be gradually contracting using silver collagen. 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 The left dorsal foot wound is debrided of tightly adherent necrotic debris and really cleans up quite nicely. The right area is worse with expansion. I did not debride this it is now over the base of the second toe ooThe area on his left buttock is smaller no debridement is required using silver collagen 5/4; left calf continues to make good progress. ooHe arrives with erythema around the wounds on his dorsal foot which even extends to the plantar aspect. Very concerning  for coexistent infection. He is finished the erythromycin I gave him for possible erythrasma this does not seem to have helped. ooThe area on the left foot is about the same base of the dorsal toes ooIs area on the buttock looks improved on the left 5/11; left calf and left buttock continued to make good progress. Left foot  is about the same to slightly improved. ooMajor problem is on the right foot. He has not had an x-ray. Deep tissue culture I did last week showed both Enterobacter and E. coli. I did not change the doxycycline I put him on empirically although neither 1 of these were plated to doxycycline. He arrives today with the erythema looking worse on both the dorsal and plantar foot. Macerated skin on the bottom of the foot. he has not been systemically unwell 5/18-Patient returns at 1 week, left calf wound appears to be making some progress, left buttock wound appears slightly worse than last time, left foot wound looks slightly better, right foot redness is marginally better. X-ray of both feet show no air or evidence of osteomyelitis. Patient is finished his Omnicef and terbinafine. He continues to have macerated skin on the bottom of the left foot as well as right 5/26; left calf wound is better, left buttock wound appears to have multiple small superficial open areas with surrounding macerated skin. X-rays that I did last time showed no evidence of osteomyelitis in either foot. He is finished cefdinir and doxycycline. I do not think that he was on terbinafine. He continues to have [name] large superficial open area on the right foot anterior dorsal and slightly between the first and second toes. I did send him to dermatology 2 months ago or so wondering about whether they would do [name] fungal scraping. I do not believe they did but did do [name] culture. We have been using silver alginate to the toe areas, he has been using antifungals at home topically either ketoconazole or Lotrisone. We are  using silver collagen on the left foot, silver alginate on the right, silver collagen on the left lateral leg and silver alginate on the left buttock 6/1; left buttock area is healed. We have the left dorsal foot, left lateral leg and right dorsal foot. We are using silver alginate to the areas on both feet and silver collagen to the area on his left lateral calf 6/8; the left buttock apparently reopened late last week. He is not really sure how this happened. He is tolerating the terbinafine. Using silver alginate to all wounds 6/15; left buttock wound is larger than last week but still superficial. ooCame in the clinic today with [name] report of purulence from the left lateral leg I did not identify any infection ooBoth areas on his dorsal feet appear to be better. He is tolerating the terbinafine. Using silver alginate to all wounds 6/22; left buttock is about the same this week, left calf quite [name] bit better. His left foot is about the same however he comes in with erythema and warmth in the right forefoot once again. Culture that I gave him in the beginning of May showed Enterobacter and E. coli. I gave him doxycycline and things seem to improve although neither 1 of these organisms was specifically plated. 6/29; left buttock is larger and dry this week. Left lateral calf looks to me to be improved. Left dorsal foot also somewhat improved right foot completely unchanged. The erythema on the right foot is still present. He is completing the Ceftin dinner that I gave him empirically [see discussion above.) 7/6 - All wounds look to be stable and perhaps improved, the left buttock wound is slightly smaller, per patient bleeds easily, completed ceftin, the right foot redness is less, he is on terbinafine 7/13; left buttock wound about the same perhaps slightly narrower. Area on the left lateral leg  continues to narrow. Left dorsal foot slightly smaller right foot about the same. We are using silver  alginate on the right foot and Hydrofera Blue to the areas on the left. Unna boot on the left 2 layer compression on the right 7/20; left buttock wound absolutely the same. Area on lateral leg continues to get better. Left dorsal foot require debridement as did the right no major change in the 7/27; left buttock wound the same size necrotic debris over the surface. The area on the lateral leg is closed once again. His left foot looks better right foot about the same although there is some involvement now of the posterior first second toe area. He is still on terbinafine which I have given him for [name] month, not certain [name] centimeter major change 06/25/19-All wounds appear to be slightly improved according to report, left buttock wound looks clean, both foot wounds have minimal to no debris the right dorsal foot has minimal slough. We are using Hydrofera Blue to the left and silver alginate to the right foot and ischial wound. 8/10-Wounds all appear to be around the same, the right forefoot distal part has some redness which was not there before, however the wound looks clean and small. Ischial wound looks about the same with no changes 8/17; his wound on the left lateral calf which was his original chronic venous insufficiency wound remains closed. Since I last saw him the areas on the left dorsal foot right dorsal foot generally appear better but require debridement. The area on his left initial tuberosity appears somewhat larger to me perhaps hyper granulated and bleeds very easily. We have been using Hydrofera Blue to the left dorsal foot and silver alginate to everything else 8/24; left lateral calf remains closed. The areas on his dorsal feet on the webspace of the first and second toes bilaterally both look better. The area on the left buttock which is the pressure ulcer stage II slightly smaller. I change the dressing to Hydrofera Blue to all areas 8/31; left lateral calf remains closed. The area  on his dorsal feet bilaterally look better. Using Hydrofera Blue. Still requiring debridement on the left foot. No change in the left buttock pressure ulcers however 9/14; left lateral calf remains closed. Dorsal feet look quite [name] bit better than 2 weeks ago. Flaking dry skin also [name] lot better with the ammonium lactate I gave him 2 weeks ago. The area on the left buttock is improved. He states that his Roho cushion developed [name] leak and he is getting [name] new one, in the interim he is offloading this vigorously 9/21; left calf remains closed. Left heel which was [name] possible DTI looks better this week. He had macerated tissue around the left dorsal foot right foot looks satisfactory and improved left buttock wound. I changed his dressings to his feet to silver alginate bilaterally. Continuing Hydrofera Blue on the left buttock. 9/28 left calf remains closed. Left heel did not develop anything [possible DTI] dry flaking skin on the left dorsal foot. Right foot looks satisfactory. Improved left buttock wound. We are using silver alginate on his feet Hydrofera Blue on the buttock. I have asked him to go back to the Lotrisone on his feet including the wounds and surrounding areas 10/5; left calf remains closed. The areas on the left and right feet about the same. [name] lot of this is epithelialized however debris over the remaining open areas. He is using Lotrisone and silver alginate. The area on the  left buttock using Hydrofera Blue 10/26. Patient has been out for 3 weeks secondary to Covid concerns. He tested negative but I think his wife tested positive. He comes in today with the left foot substantially worse, right foot about the same. Even more concerning he states that the area on his left buttock closed over but then reopened and is considerably deeper in one aspect than it was before [stage III wound] 11/2; left foot really about the same as last week. Quarter sized wound on the dorsal foot just proximal  to the first second toes. Surrounding erythema with areas of denuded epithelium. This is not really much different looking. Did not look like cellulitis this time however. ooRight foot area about the same.. We have been using silver alginate alginate on his toes ooLeft buttock still substantial irritated skin around the wound which I think looks somewhat better. We have been using Hydrofera Blue here. 11/9; left foot larger than last week and [name] very necrotic surface. Right foot I think is about the same perhaps slightly smaller. Debris around the circumference also addressed. Unfortunately on the left buttock there is been [name] decline. Satellite lesions below the major wound distally and now [name] an additional one posteriorly we have been using Hydrofera Blue but I think this is [name] pressure issue 11/16; left foot ulcer dorsally again [name] very adherent necrotic surface. Right foot is about the same. Not much change in the pressure ulcer on his left buttock. 11/30; left foot ulcer dorsally basically the same as when I saw him 2 weeks ago. Very adherent fibrinous debris on the wound surface. Patient reports [name] lot of drainage as well. 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 We have been using Iodoflex, patient changed back to alginate because of the drainage. Area on his right dorsal foot really looks benign with [name] healthier surface certainly [name] lot better than on the left. Left buttock wounds all improved using Hydrofera Blue 12/7; left dorsal foot again no improvement. Tightly adherent debris. PCR culture I did last week only showed likely skin contaminant. I have gone ahead and done [name] punch biopsy of this which is about the last thing in terms of investigations I can think to do. He has known venous insufficiency and venous hypertension and this could be the issue here. The area on the right foot is about the same left buttock slightly worse according to our  intake nurse secondary to [facility] Blue sticking to the wound 12/14; biopsy of the left foot that I did last time showed changes that could be related to wound healing/chronic stasis dermatitis phenomenon no neoplasm. We have been using silver alginate to both feet. I change the one on the left today to Sorbact and silver alginate to his other 2 wounds 12/28; the patient arrives with the following problems; ooMajor issue is the dorsal left foot which continues to be [name] larger deeper wound area. Still with [name] completely nonviable surface ooParadoxically the area mirror image on the right on the right dorsal foot appears to be getting better. ooHe had some loss of dry denuded skin from the lower part of his original wound on the left lateral calf. Some of this area looked [name] little vulnerable and for this reason we put him in wrap that on this side this week ooThe area on his left buttock is larger. He still has the erythematous circular area which I think is [name] combination of pressure, sweat.  This does not look like cellulitis or fungal dermatitis 11/26/2019; -Dorsal left foot large open wound with depth. Still debris over the surface. Using Sorbact ooThe area on the dorsal right foot paradoxically has closed over [facility] has [name] reopening on the left ankle laterally at the base of his original wound that extended up into the calf. This appears clean. ooThe left buttock wound is smaller but with very adherent necrotic debris over the surface. We have been using silver alginate here as well The patient had arterial studies done in 2017. He had biphasic waveforms at the dorsalis pedis and posterior tibial bilaterally. ABI in the left was 1.17. Digit waveforms were dampened. He has slight spasticity in the great toes I do not think [name] TBI would be possible 1/11; the patient comes in today with [name] sizable reopening between the first and second toes on the right. This is not exactly in the same location where  we have been treating wounds previously. According to our intake nurse this was actually fairly deep but 0.6 cm. The area on the left dorsal foot looks about the same the surface is somewhat cleaner using Sorbact, his MRI is in 2 days. We have not managed yet to get arterial studies. The new reopening on the left lateral calf looks somewhat better using alginate. The left buttock wound is about the same using alginate 1/18; the patient had his ARTERIAL studies which were quite normal. ABI in the right at 1.13 with triphasic/biphasic waveforms on the left ABI 1.06 again with triphasic/biphasic waveforms. It would not have been possible to have done [name] toe brachial index because of spasticity. We have been using Sorbac to the left foot alginate to the rest of his wounds on the right foot left lateral calf and left buttock 1/25; arrives in clinic with erythema and swelling of the left forefoot worse over the first MTP area. This extends laterally dorsally and but also posteriorly. Still has an area on the left lateral part of the lower part of his calf wound it is eschared and clearly not closed. ooArea on the left buttock still with surrounding irritation and erythema. ooRight foot surface wound dorsally. The area between the right and first and second toes appears better. 2/1; ooThe left foot wound is about the same. Erythema slightly better I gave him [name] week of doxycycline empirically ooRight foot wound is more extensive extending between the toes to the plantar surface ooLeft lateral calf really no open surface on the inferior part of his original wound however the entire area still looks vulnerable ooAbsolutely no improvement in the left buttock wound required debridement. 2/8; the left foot is about the same. Erythema is slightly improved I gave him clindamycin last week. ooRight foot looks better he is using Lotrimin and silver alginate ooHe has [name] breakdown in the left lateral calf.  Denuded epithelium which I have removed ooLeft buttock about the same were using Hydrofera Blue 2/15; left foot is about the same there is less surrounding erythema. Surface still has tightly adherent debris which I have debriding however not making any progress ooRight foot has [name] substantial wound on the medial right second toe between the first and second webspace. ooStill an open area on the left lateral calf distal area. ooButtock wound is about the same 2/22; left foot is about the same less surrounding erythema. Surface has adherent debris. Polymen Ag Right foot area significant wound between the first and second toes. We have been using silver alginate  here Left lateral leg polymen Ag at the base of his original venous insufficiency wound ooLeft buttock some improvement here 3/1; ooRight foot is deteriorating in the first second toe webspace. Larger and more substantial. We have been using silver alginate. ooLeft dorsal foot about the same markedly adherent surface debris using PolyMem Ag ooLeft lateral calf surface debris using PolyMem AG ooLeft buttock is improved again using PolyMem Ag. ooHe is completing his terbinafine. The erythema in the foot seems better. He has been on this for 2 weeks 3/8; no improvement in any wound area in fact he has [name] small open area on the dorsal midfoot which is new this week. He has not gotten his foot x-rays yet 3/15; his x-rays were both negative for osteomyelitis of both feet. No major change in any of his wounds on the extremities however his buttock wounds are better. We have been using polymen on the buttocks, left lower leg. Iodoflex on the left foot and silver alginate on the right 3/22; arrives in clinic today with the 2 major issues are the improvement in the left dorsal foot wound which for once actually looks healthy with [name] nice healthy wound surface without debridement. Using Iodoflex here. Unfortunately on the left lateral calf  which is in the distal part of his original wound he came to the clinic here for there was purulent drainage noted some increased breakdown scattered around the original area and [name] small area proximally. We we are using polymen here will change to silver alginate today. His buttock wound on the left is better and I think the area on the right first second toe webspace is also improved 3/29; left dorsal foot looks better. Using Iodoflex. Left ankle culture from deterioration last time grew E. coli, Enterobacter and Enterococcus. I will give him [name] course of cefdinir although that will not cover Enterococcus. The area on the right foot in the webspace of the first and second toe lateral first toe looks better. The area on his buttock is about healed Vascular appointment is on April 21. This is to look at his venous system vis--vis continued breakdown of the wounds on the left including the left lateral leg and left dorsal foot he. He has had previous ablations on this side 4/5; the area between the right first and second toes lateral aspect of the first toe looks better. Dorsal aspect of the left first toe on the left foot also improved. Unfortunately the left lateral lower leg is larger and there is [name] second satellite wound superiorly. The usual superficial abrasions on the left buttock overall better but certainly not closed 4/12; the area between the right first and second toes is improved. Dorsal aspect of the left foot also slightly smaller with [name] vibrant healthy looking surface. No real change in the left lateral leg and the left buttock wound is healed He has an unaffordable co-pay for Apligraf. Appointment with vein and vascular with regards to the left leg venous part of the circulation is on 4/21 4/19; we continue to see improvement in all wound areas. Although this is minor. He has his vascular appointment on 4/21. The area on the left buttock has not reopened although right in the center of  this area the skin looks somewhat threatened 4/26; the left buttock is unfortunately reopened. In general his left dorsal foot has [name] healthy surface and looks somewhat smaller although it was not measured as such. The area between his first and second toe webspace on  the right as [name] small wound against the first toe. The patient saw vascular surgery. The real question I was asking was about the small saphenous vein on the left. He has previously ablated left greater saphenous vein. Nothing further was commented on on the left. Right greater saphenous vein without reflux at the saphenofemoral junction or proximal thigh there was no indication for ablation of the right greater saphenous vein duplex was negative for DVT bilaterally. They did not think there was anything from [name] vascular surgery point of view that could be offered. They ABIs within normal limits 5/3; only small open area on the left buttock. The area on the left lateral leg which was his original venous reflux is now 2 wounds both which look clean. We are using Iodoflex on the left dorsal foot which looks healthy and smaller. He is down to [name] very tiny area between the right first and second toes, using silver alginate 5/10; all of his wounds appear better. We have much better edema control in 4 layer compression on the left. This may be the factor that is allowing the left foot and left lateral calf to heal. He has external compression garments at home 04/14/20-All of his wounds are progressing well, the left forefoot is practically closed, left ischium appears to be about the same, right toe webspace is also smaller. The left lateral leg is about the same, continue using Hydrofera Blue to this, silver alginate to the ischium, Iodoflex to the toe space on the right 6/7; most of his wounds outside of the left buttock are doing well. The area on the left lateral calf and left dorsal foot are smaller. The area on the right foot in between the  first and second toe webspace is barely visible although he still says there is some drainage here is the only reason I did not heal this out. ooUnfortunately the area on the left buttock almost looks like he has [name] skin tear from tape. He has open wound and then [name] large flap of skin that we are trying to get adherence over an area just next to the remaining wound 6/21; 2 week follow-up. I believe is been here for nurse visits. Miraculously the area between his first and second toes on the left dorsal foot is closed over. Still open on the right first second web space. The left lateral calf has 2 open areas. Distally this is more superficial. The proximal area had [name] little more depth and required debridement of adherent necrotic material. His buttock wound is actually larger we have been using silver alginate here 6/28; the patient's area on the left foot remains closed. Still open wet area between the first and second toes on the right and also extending into the plantar aspect. We have been using silver alginate in this location. He has 2 areas on the left lower leg part of his original long wounds which I think are better. We have been using Hydrofera Blue here. Hydrofera Blue to the left buttock which is stable 7/12; left foot remains closed. Left ankle is closed. May be [name] small area between his right first and second toes the only truly open area is on the left buttock. We have been using Hydrofera Blue here 7/19; patient arrives with marked deterioration especially in the left foot and ankle. We did not put him in [name] compression wrap on the left last week in fact he wore his juxta lite stockings on either side although he does  not have an underlying stocking. He has [name] reopening on the left dorsal foot, left lateral ankle and [name] new area on the right dorsal ankle. More worrisome is the degree of erythema on the left foot extending on the lateral foot into the lateral lower leg on the left 7/26; the  patient had erythema and drainage from the lateral left ankle last week. Culture of this grew MRSA resistant to doxycycline and clindamycin which are the 2 antibiotics we usually use with this patient who has multiple antibiotic allergies including linezolid, trimethoprim sulfamethoxazole. I had give him an empiric doxycycline and he comes in the area certainly looks somewhat better although it is blotchy in his lower leg. He has not been systemically unwell. He has had areas on the left dorsal foot which is [name] reopening, chronic wounds on the left lateral ankle. Both of these I think are secondary to chronic venous insufficiency. The area between his first and second toes is closed as far as I can tell. He had [name] new wrap injury on the right dorsal ankle last week. Finally he has an area on the left buttock. We have been using silver alginate to everything except the left buttock we are using Hydrofera Blue 06/30/20-Patient returns at 1 week, has been given [name] sample dose pack of NUZYRA which is [name] tetracycline derivative [omadacycline], patient has completed those, we have been using silver alginate to almost all the wounds except the left ischium where we are using Hydrofera Blue all of them look better 8/16; since I last saw the patient he has been doing well. The area on the left buttock, left lateral ankle and left foot are all closed today. He has completed the [place] I gave him last time and tolerated this well. He still has open areas on the right dorsal ankle and in the right first second toe area which we are using silver alginate. 8/23; we put him in his bilateral external compression stockings last week as he did not have anything open on either leg except for concerning area between the right first and second toe. He comes in today with an area on the left dorsal foot slightly more proximal than the original wound, the left lateral foot but this is actually [name] continuation of the area he had on  the left lateral ankle from last time. As well he is opened up on the left buttock again. 8/30; comes in today with things looking [name] lot better. The area on the left lower ankle has closed down as has the left foot but with eschar in both areas. The area on the dorsal right ankle is also epithelialized. Very little remaining of the left buttock wound. We have been using silver alginate on all wound areas 9/13; the area in the first second toe webspace on the right has fully epithelialized. He still has some vulnerable epithelium on the right and the ankle and the dorsal foot. He notes weeping. He is using his juxta lite stocking. On the left again the left dorsal foot is closed left lateral ankle is closed. We went to the juxta lite stocking here as well. ooStill vulnerable in the left buttock although only 2 small open areas remain here 9/27; 2-week follow-up. We did not look at his left leg but the patient says everything is closed. He is [name] bit disturbed by the amount of edema in his left foot he is using juxta lite stockings but asking about over the toes stockings which  would be 30/40, will talk to him next time. According to him there is no open wound on either the left foot or the left ankle/calf He has an open area on the dorsal right calf which I initially point [name] wrap injury. He has superficial remaining wound on the left ischial tuberosity been using silver alginate although he says this sticks to the wound 10/5; we gave him 2-week follow-up but he called yesterday expressing some concerns about his right foot right ankle and the left buttock. He came in early. There is still no open areas on the left leg and that still in his juxta lite stocking 10/11; he only has 1 small area on the left buttock that remains measuring millimeters 1 mm. Still has the same irritated skin in this area. We recommended zinc oxide when this eventually closes and pressure relief is meticulously is he can do  this. He still has an area on the dorsal part of his right first through third toes which is [name] bit irritated and still open and on the dorsal ankle near the crease of the ankle. We have been using silver alginate and using his own stocking. He has nothing open on the left leg or foot 10/25; 2-week follow-up. Not nearly as good on the left buttock as I was hoping. For open areas with 5 looking threatened small. He has the erythematous irritated chronic skin in this area. oo1 area on the right dorsal ankle. He reports this area bleeds easily ooRight dorsal foot just proximal to the base of his toes ooWe have been using silver alginate. 11/8; 2-week follow-up. Left buttock is about the same although I do not think the wounds are in the same location we have been using silver alginate. I have asked him to use zinc oxide on the skin around the wounds. ooHe still has [name] small area on the right dorsal ankle he reports this bleeds easily ooRight dorsal foot just proximal to the base of the toes does not have anything open although the skin is very dry and scaly ooHe has [name] new opening on the nailbed of the left great toe. Nothing on the left ankle 11/29; 3-week follow-up. Left buttock has 2 open areas. And washing of these wounds today started bleeding easily. Suggesting very friable tissue. We have been using silver alginate. Right dorsal ankle which I thought was initially [name] wrap injury we have been using silver alginate. Nothing open between the toes that I can see. He states the area on the left dorsal toe nailbed healed after the last visit in 2 or 3 days 12/13; 3-week follow-up. His left buttock now has 3 open areas but the original 2 areas are smaller using polymen here. Surrounding skin looks better. The right dorsal ankle is closed. He has [name] small opening on the right dorsal foot at the level of the third toe. In general the skin looks better here. He is wearing his juxta lite stocking on the  left leg says there is nothing open 11/24/2020; 3 weeks follow-up. His left buttock still has the 3 open areas. We have been using polymen but due to lack of response he changed to [facility] area. Surrounding skin is dry erythematous and irritated looking. There is no evidence of infection either bacterial or fungal however there is loss of surface epithelium ooHe still has very dry skin in his foot causing irritation and erythema on the dorsal part of his toes. This is not responded to prolonged  courses of antifungal simply looks dry and irritated 1/24; left buttock area still looks about the same he was unable to find the triad ointment that we had suggested. The area on the right lower leg just above the dorsal ankle has reopened and the areas on the right foot between the first second and second third toes and scaling on the bottom of the foot has been about the same for quite some time now. been using silver alginate to all wound areas 2/7; left buttock wound looked quite good although not much smaller in terms of surface area surrounding skin looks better. Only [name] few dry flaking areas on the right foot in between the first and second toes the skin generally looks better here [ammonium lactate]. Finally the area on the right dorsal ankle is closed 2/21; ooThere is no open area on the right foot even between the right first and second toe. Skin around this area dorsally and plantar aspects look better. ooHe has [name] reopening of the area on the right ankle just above the crease of the ankle dorsally. I continue to think that this is probably friction from spasms may be even this time with his stocking under the compression stockings. ooWounds on his left buttock look about the same there [name] couple of areas that have reopened. He has [name] total square area of loss of epithelialization. This does not look like infection it looks like [name] contact dermatitis but I just cannot determine to what 3/14;  there is nothing on the right foot between the first and second toes this was carefully inspected under illumination. Some chronic irritation on the dorsal part of his foot from toes 1-3 at the base. Nothing really open here substantially. Still has an area on the right foot/ankle that is actually larger and hyper granulated. His buttock area on the left is just about closed however he has chronic inflammation with loss of the surface epithelial layer 3/28; 2-week follow-up. In clinic today with [name] new wound on the left anterior mid tibia. Says this happened about 2 weeks ago. He is not really sure how wonders about the spasticity of his legs at night whether that could have caused this other than that he does not have [name] good idea. He has been using topical antibiotics and silver alginate. The area on his right dorsal ankle seems somewhat better. ooFinally everything on his left buttock is closed. 4/11; 2-week follow-up. All of his wounds are better except for the area over the ischium and left buttock which have opened up widely again. At least part of this is covered in necrotic fibrinous material another part had rolled nonviable skin. The area on the right ankle, left anterior mid tibia are both [name] lot better. He had no open wounds on either foot including the areas between the first and second toes 4/25; patient presents for 2-week follow-up. He states that the wounds are overall stable. He has no complaints today and states he is using Hydrofera Blue to open wounds. 5/9; have not seen this man in over [name] month. For my memory he has open areas on the left mid tibia and right ankle. T oday he has new open area on the right dorsal foot which we have not had [name] problem with recently. He has the sustained area on the left buttock He is also changed his insurance at the beginning of the year [employer]. We will need prior authorizations for debridement 5/23; patient presents for 2-week follow-up.  He  has prior authorizations for debridement. He denies any issues in the past 2 weeks with his wound care. He has been using Hydrofera Blue to all the wounds. He does report [name] circular rash to the upper left leg that is new. He denies acute signs of infection. 6/6; 2-week follow-up. The patient has open wounds on the left buttock which are worse than the last time I saw this about [name] month ago. He also has [name] new area to me on the left anterior mid tibia with some surrounding erythema. The area on the dorsal ankle on the right is closed but I think this will be [name] friction injury every time this area is exposed to either our wraps or his compression stockings caused by unrelenting spasms in this leg. 6/20; 2-week follow-up. oo The patient has open wounds on the left buttock which is about the same. Using [facility] here. - The left mid tibia has [name] static amount of surrounding erythema. Also [name] raised area in the center. We have been using Hydrofera Blue here. ooooFinally he has broken down in his dorsal right foot extending between the first and second toes and going to the base of the first and second toe webspace. I have previously assumed that this was severe venous hypertension 7/5; 2-week follow-up oo The left buttock wound actually looks better. We are using Hydrofera Blue. He has extensive skin irritation around this area and I have not really been able to get that any better. I have tried Lotrisone i.e. antifungals and steroids. More most recently we have just been using Coloplast really looks about the same. oo The left mid tibia which was new last week culture to have very resistant staph aureus. Not only methicillin-resistant but doxycycline resistant. The patient has [name] plethora of antibiotic allergies including sulfa, linezolid. I used topical bacitracin on this but he has not started this yet. oo In addition he has an expanding area of erythema with [name] wound on the dorsal right foot. I  did [name] deep tissue culture of this area today 7/12; oo Left buttock area actually looks better surrounding skin also looks less irritated. oo Left mid tibia looks about the same. He is using bacitracin this is not worse oo Right dorsal foot looks about the same as well. oo The left first toe also looks about the same 7/19; left buttock wound continues to improve in terms of open areas oo Left mid tibia is still concerning amount of swelling he is using bacitracin oo Dorsal left first toe somewhat smaller oo Right dorsal foot somewhat smaller 7/25; left buttock wound actually continues to improve oo Left mid tibia area has less swelling. I gave him all my samples of new Nuzyra. This seems to have helped although the wound is still open it. His abrasion closed by here oo Left dorsal great toe really no better. Still [name] very nonviable surface oo Right dorsal foot perhaps some better. oo We have been using bacitracin and silver nitrate to the areas on his lower legs and Hydrofera Blue to the area on the buttock. 8/16 oo Disappointed that his left buttock wound is actually more substantial. Apparently during the last nurse visit these were both very small. He has continued irritation to [name] large area of skin on his buttock. I have never been able to totally explain this although I think it some combination of the way he sits, pressure, moisture. He is not incontinent enough to contribute to this. oo  Left dorsal great toe still fibrinous debris on the surface that I have debrided today oo Large area across the dorsal right toes. oo The area on the left anterior mid tibia has less swelling. He completed the [place]. This does not look infected although the tissue is still fried 8/30; 2-week follow-up. oo Left buttock areas not improved. We used Hydrofera Blue on this. Weeping wet with the surrounding erythema that I have not been able to control even with Lotrisone and topical  Coloplast oo Left dorsal great toe looks about the same oo More substantial area again at the base of his toes on the left which is new this week. oo Area across the dorsal right toes looks improved oo The left anterior mid tibia looks like it is trying to close 9/13; 2-week follow-up. Using silver alginate on all of his wounds. The left dorsal foot does not look any better. He has the area on the dorsal toe and also the areas at the base of all of his toes 1 through 3. On the right foot he has [name] similar pattern in [name] similar area. He has the area on his left mid tibia that looks fairly healthy. Finally the area on his left buttock looks somewhat bette 9/20; culture I did of the left foot which was [name] deep tissue culture last time showed E. coli he has erythema around this wound. Still [name] completely necrotic surface. His right dorsal foot looks about the same. He has [name] very friable surface to the left anterior mid tibia. Both buttock wounds look better. We have been using silver alginate to all wounds 10/4; he has completed the cephalexin that I gave him last time for the left foot. He is using topical gentamicin under silver alginate silver alginate being applied to all the wounds. Unfortunately all the wounds look irritated on his dorsal right foot dorsal left foot left mid tibia. I wonder if this could be [name] silver allergy. I am going to change him to [facility] on the lower extremity. The skin on the left buttock and left posterior thigh still flaking dry and irritated. This has continued no matter what I have applied topically to this. He has [name] solitary open wound which by itself does not look too bad however the entire area of surrounding skin does not change no matter what we have applied here 10/18; the area on the left dorsal foot and right dorsal foot both look better. The area on the right extends into the plantar but not between his toes. We have been using silver alginate. He still  has [name] rectangular erythematous area around the area on the left tibia. The wound itself is very small. Finally everything on his left buttock looks [name] little larger the skin is erythematous 11/15; patient comes in with the left dorsal and right dorsal foot distally looking somewhat better. Still nonviable surface on the left foot which required debridement. He still has the area on the left anterior mid tibia although this looks somewhat better. He has [name] new area on the right lateral lower leg just above the ankle. Finally his left buttock looks terrible with multiple superficial open wounds geometric square shaped area of chronic erythema which I have not been able to sort through 11/29; right dorsal foot and left dorsal foot both look somewhat better. No debridement required. He has the fragile area on the left anterior mid tibia this looks and continues to look somewhat better. Right lateral lower leg  just above the ankle we identified last time also looks better. In general the area on his left buttock looks improved. We are using Hydrofera Blue to all wound areas 12/13; right dorsal foot looks better. The area on the right lateral leg is healed. Left dorsal foot has 2 open areas both of which require debridement. The fragile area on the left anterior mid tibial looks better. Smaller area on his buttocks. Were using Hydrofera Blue 1/10; patient comes in with everything looking slightly larger and/or worse. This includes his left buttock, reopening of the left mid tibia, larger areas on the left dorsal foot and what looks to be [name] cellulitis on the right dorsal and plantar foot. We have been using Hydrofera Blue on all wounds. 1/17; right dorsal foot distally looks better today. The left foot has 2 open wounds that are about the same surrounding erythema. Culture I did last week showed rare Enterococcus and [name] multidrug resistant MRSA. 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 No malignancy they did not stain for fungus 1/24; his right distal foot is not closed dry and scaly but the wound looks like it is contracting. I did not debride anything here. Problem on his left dorsal foot with expanding erythema. Apparently there were problems last week getting the Elesa Hacker however it is now available at the [employer] [facility] but [name] week later. He is using ketoconazole and Coloplast to the left buttock along with Hydrofera Blue this actually looks quite [name] bit better today. 1/31; right dorsal foot again is dry and scaly but looking to contract. He has been using [name] moisturizer on his feet at my request but he is not sure which 1. The left dorsal foot wounds look about the same there is erythema here that I marked last week however after course of Nuzyra it certainly is not any better but not any worse either. Finally on his left buttock the skin continues to look better he has the original wound but [name] new substantial area towards the gluteal cleft. Almost like [name] skin tear. I used scissors to remove skin and subcutaneous tissue here silver nitrate and direct pressure 2/7; right dorsal foot. This does not look too much different from last week. Some erythema skin dry and scaly. No debridement. Left dorsal great toe again still not much improvement. I did remove flaking dry skin and callus from around the edge. Finally on his left buttock. The skin is somewhat better in the periwound. Surface wounds are superficial somewhat better than last week. 01/26/2022: Is [name] little bit of [name] mystery as to why his wounds fail to respond to treatments and actually seem to get worse. This is my first encounter with this patient. He was previously followed by Dr. Dellia Nims. Based upon my review of the chart, it seems that there is [name] little bit of [name] mystery as to why his wounds do not respond as anticipated to the interventions applied and sometimes even get worse. Biopsies have  been performed and he was seen by dermatology in [place], but that did not shed any light on the matter. T oday, his gluteal wound is larger, with substantial drainage, rather malodorous. The food wounds are not terrible, but he has [name] lot of callus and scaly skin around these. He is currently getting silver alginate on the gluteal wound, with idodoflex to the feet. He is using lotrisone on his legs for the dry, scaly skin. 02/09/2022: There has really  been no change to any of his wounds. The gluteal wound less drainage and odor, but remains about the same size, the periwound skin remains oddly scaly. His lower extremity wounds also appear roughly the same size. They continue to accumulate [name] small amount of slough. The periwound on his feet and ankle wounds has dry eschar and loose dead skin. We have been using silver alginate on the gluteal wound and Iodoflex on his feet and ankle wounds. T the periwound around his gluteal lesion and Lotrisone on his feet and legs. o 02/23/2022: The right plantar foot wound is closed. The gluteal site looks small but has continued to produce hypertrophic granulation tissue. The foot wounds all look about the same on the dorsal surface of the right foot; on the left, there is only [name] small open area at the site of where his left great toenail would have been. 03/16/2022: The right ankle wound is healed. The right dorsal foot wound is about the same. The left dorsal foot wound is quite [name] bit smaller and the ischial wound is nearly closed. 03/30/2022: The right ankle wound reopened. Both dorsal foot wounds are quite [name] bit smaller. Unfortunately, he appears to have sheared part of his ischial wound open further, perhaps during [name] transfer. 04/13/2022: The right ankle wound has hypertrophic granulation tissue present. The dorsal foot wounds continue to decrease in size. The ischial wound looks about the same today, no better, no worse. 04/27/2022: The right ankle wound has  closed. Unfortunately, it looks like some moisture got underneath the dry skin on both of his dorsal feet and these wounds have expanded in size. The ischial wound remains the same with perhaps [name] little bit more slough accumulation than at our previous visit. Patient History Information obtained from Patient. Family History Diabetes - Father, Hypertension - Mother, No family history of Cancer, Heart Disease, Hereditary Spherocytosis, Kidney Disease, Lung Disease, Stroke, Thyroid Problems, Tuberculosis. Social History Former smoker, Marital Status - Married, Alcohol Use - Moderate, Drug Use - No History, Caffeine Use - Daily. Medical History Ear/Nose/Mouth/Throat Denies history of Chronic sinus problems/congestion, Middle ear problems Hematologic/Lymphatic Denies history of Anemia, Hemophilia, Human Immunodeficiency Virus, Lymphedema, Sickle Cell Disease Respiratory Patient has history of Sleep Apnea - does not tolerate cpap Denies history of Aspiration, Asthma, Chronic Obstructive Pulmonary Disease (COPD), Pneumothorax, Tuberculosis Cardiovascular Patient has history of Hypertension - lisinopril HCTZ Denies history of Angina, Arrhythmia, Congestive Heart Failure, Coronary Artery Disease, Deep Vein Thrombosis, Hypotension, Myocardial Infarction, Peripheral Arterial Disease, Peripheral Venous Disease, Phlebitis, Vasculitis Gastrointestinal Denies history of Cirrhosis , Colitis, Crohnoos, Hepatitis [name], Hepatitis B, Hepatitis C Endocrine Denies history of Type I Diabetes, Type II Diabetes Genitourinary Denies history of End Stage Renal Disease Immunological Denies history of Lupus Erythematosus, Raynaudoos, Scleroderma Integumentary (Skin) Denies history of History of Burn Musculoskeletal Denies history of Gout, Rheumatoid Arthritis, Osteoarthritis, Osteomyelitis Neurologic Patient has history of Paraplegia Denies history of Dementia, Neuropathy, Quadriplegia, Seizure  Disorder Oncologic Denies history of Received Chemotherapy, Received Radiation Psychiatric Denies history of Anorexia/bulimia, Confinement Anxiety Hospitalization/Surgery History - cellulitis in leg. - left leg vein ablation. Objective Constitutional Tachycardic, asymptomatic.Marland Kitchen No acute distress. Vitals Time Taken: 8:22 AM, Height: 70 in, Weight: 216 lbs, BMI: 31, Temperature: 98.3 F, Pulse: 120 bpm, Respiratory Rate: 18 breaths/min, Blood Pressure: 134/76 mmHg. Respiratory Normal work of breathing on room air. General Notes: 04/27/2022: The dorsal foot wounds are both bigger today with [name] little bit of slough accumulation in small pockmarks. The right ankle wound  is closed. The ischial wound has [name] little bit more slough accumulation and is otherwise unchanged. The abrasion on his posterior thigh has healed. Integumentary (Hair, Skin) Wound #41R status is Open. Original cause of wound was Gradually Appeared. The date acquired was: 03/16/2020. The wound has been in treatment 110 weeks. The wound is located on the Left Ischium. The wound measures 3.8cm length x 3cm width x 0.1cm depth; 8.954cm^2 area and 0.895cm^3 volume. There is Fat Layer (Subcutaneous Tissue) exposed. There is no tunneling or undermining noted. There is [name] medium amount of serosanguineous drainage noted. The wound margin is distinct with the outline attached to the wound base. There is large (67-100%) red, pink, friable granulation within the wound bed. There is [name] small (1- 33%) amount of necrotic tissue within the wound bed including Adherent Slough. Wound #52 status is Open. Original cause of wound was Gradually Appeared. The date acquired was: 03/27/2021. The wound has been in treatment 56 weeks. The wound is located on the Right,Dorsal Foot. The wound measures 0.1cm length x 0.1cm width x 0.1cm depth; 0.008cm^2 area and 0.001cm^3 volume. There is Fat Layer (Subcutaneous Tissue) exposed. There is no tunneling or undermining  noted. There is [name] small amount of serosanguineous drainage noted. The wound margin is flat and intact. There is large (67-100%) pink granulation within the wound bed. There is no necrotic tissue within the wound bed. Wound #56 status is Open. Original cause of wound was Gradually Appeared. The date acquired was: 07/11/2021. The wound has been in treatment 40 weeks. The wound is located on the Left,Dorsal Foot. The wound measures 4cm length x 3cm width x 0.1cm depth; 9.425cm^2 area and 0.942cm^3 volume. There is Fat Layer (Subcutaneous Tissue) exposed. There is no tunneling or undermining noted. There is [name] medium amount of serosanguineous drainage noted. The wound margin is flat and intact. There is large (67-100%) red, pink granulation within the wound bed. There is [name] small (1-33%) amount of necrotic tissue within the wound bed including Adherent Slough. Wound #62 status is Healed - Epithelialized. Original cause of wound was Gradually Appeared. The date acquired was: 03/30/2022. The wound has been in treatment 4 weeks. The wound is located on the Right,Anterior Ankle. The wound measures 0cm length x 0cm width x 0cm depth; 0cm^2 area and 0cm^3 volume. There is no tunneling or undermining noted. There is [name] none present amount of drainage noted. The wound margin is flat and intact. There is no granulation within the wound bed. There is no necrotic tissue within the wound bed. 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 There was [name] Selective/Open Wound Non-Viable Tissue Debridement with [name] total area of 11.4 sq cm performed by Fredirick Maudlin, MD. With the following instrument(s): Curette to remove Non-Viable tissue/material. Material removed includes [facility]. No specimens were taken. [name] time out was conducted at 08:42, prior to the start of the procedure. [name] Moderate amount of bleeding was controlled with Silver Nitrate. The  procedure was tolerated well with [name] pain level of 0 throughout and [name] pain level of 0 following the procedure. Post Debridement Measurements: 3.8cm length x 3cm width x 0.1cm depth; 0.895cm^3 volume. Post debridement Stage noted as Category/Stage II. Character of Wound/Ulcer Post Debridement is improved. Post procedure Diagnosis Wound #41R: Same as Pre-Procedure Wound #52 Pre-procedure diagnosis of Wound #52 is [name] Trauma, Other located on the Right,Dorsal Foot . There was [name] Selective/Open Wound Skin/Epidermis Debridement with [name] total area of 4 sq cm performed by Fredirick Maudlin, MD. With the following instrument(s): Curette to remove Non-Viable tissue/material. Material removed includes Callus, Slough, and Skin: Epidermis. No specimens were taken. [name] time out was conducted at 08:42, prior to the start of the procedure. [name] Minimum amount of bleeding was controlled with Pressure. The procedure was tolerated well with [name] pain level of 0 throughout and [name] pain level of 0 following the procedure. Post Debridement Measurements: 0.1cm length x 0.1cm width x 0.1cm depth; 0.001cm^3 volume. Character of Wound/Ulcer Post Debridement is improved. 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 There was [name] Selective/Open Wound Skin/Epidermis Debridement with [name] total area of  12 sq cm performed by Fredirick Maudlin, MD. With the following instrument(s): Curette to remove Non-Viable tissue/material. Material removed includes Callus, Slough, and Skin: Epidermis. No specimens were taken. [name] time out was conducted at 08:42, prior to the start of the procedure. [name] Minimum amount of bleeding was controlled with Pressure. The procedure was tolerated well with [name] pain level of 0 throughout and [name] pain level of 0 following the procedure. Post Debridement Measurements: 4cm length x 3cm width x 0.1cm depth; 0.942cm^3 volume. Character of Wound/Ulcer Post Debridement is improved. Post procedure Diagnosis Wound #56: Same as Pre-Procedure Plan Follow-up Appointments: Return Appointment in 2 weeks. - Dr Celine Ahr - Room 1 - Tuesday 6/20 at 8:15 Bathing/ Shower/ Hygiene: May shower and wash wound with soap and water. - on days that dressing is changed Edema Control - Lymphedema / SCD / Other: Elevate legs to the level of the heart or above for 30 minutes daily and/or when sitting, [name] frequency of: - throughout the day Moisturize legs daily. - using Aquaphor generously to both legs and feet with dressing changes Compression stocking or Garment 30-40 mm/Hg pressure to: - Juxtalite to both legs daily Off-Loading: Roho cushion for wheelchair Turn and reposition every 2 hours WOUND #41R: - Ischium Wound Laterality: Left Cleanser: Soap and Water Every Other Day/30 Days Discharge Instructions: May shower and wash wound with dial antibacterial soap and water prior to dressing change. Peri-Wound Care: Zinc Oxide Ointment 30g tube Every Other Day/30 Days Discharge Instructions: Apply Zinc Oxide to periwound with each dressing change Prim Dressing: KerraCel Ag Gelling Fiber Dressing, 4x5 in (silver alginate) (Generic) Every Other Day/30 Days ary Discharge Instructions: Apply silver alginate to wound bed as instructed Secondary Dressing: ABD Pad, 8x10 (Generic) Every Other Day/30 Days Discharge  Instructions: Apply over primary dressing as directed. Secondary Dressing: Woven Gauze Sponge, Non-Sterile 4x4 in Every Other Day/30 Days Discharge Instructions: Apply over primary dressing as directed. Secured With: 38M Medipore H Soft Cloth Surgical T ape, 4 x 10 (in/yd) Every Other Day/30 Days Discharge Instructions: Secure with tape as directed. WOUND #52: - Foot Wound Laterality: Dorsal, Right Cleanser: Soap and Water Every Other Day/30 Days Discharge Instructions: May shower and wash wound with dial antibacterial soap and  water prior to dressing change. Peri-Wound Care: Triamcinolone 15 (g) Every Other Day/30 Days Discharge Instructions: Use triamcinolone 15 (g) as directed Peri-Wound Care: Sween Lotion (Moisturizing lotion) Every Other Day/30 Days Discharge Instructions: Apply Aquaphor moisturizing lotion as directed Peri-Wound Care: Lotrisone Every Other Day/30 Days Discharge Instructions: Apply Lotrisone to periwound Prim Dressing: KerraCel Ag Gelling Fiber Dressing, 2x2 in (silver alginate) Every Other Day/30 Days ary Discharge Instructions: Apply silver alginate to wound bed as instructed Secondary Dressing: Woven Gauze Sponge, Non-Sterile 4x4 in Every Other Day/30 Days Discharge Instructions: Apply over primary dressing as directed. Secured With: [employer], 4.5x3.1 (in/yd) (Generic) Every Other Day/30 Days Discharge Instructions: Secure with Kerlix as directed. Secured With: 49M Medipore H Soft Cloth Surgical T ape, 4 x 10 (in/yd) (Generic) Every Other Day/30 Days Discharge Instructions: Secure with tape as directed. WOUND #56: - Foot Wound Laterality: Dorsal, Left Cleanser: Soap and Water Every Other Day/30 Days Discharge Instructions: May shower and wash wound with dial antibacterial soap and water prior to dressing change. Peri-Wound Care: Triamcinolone 15 (g) Every Other Day/30 Days Discharge Instructions: Use triamcinolone 15 (g) as directed Peri-Wound Care: Sween  Lotion (Moisturizing lotion) Every Other Day/30 Days Discharge Instructions: Apply Aquaphor moisturizing lotion as directed Peri-Wound Care: Lotrisone Every Other Day/30 Days Discharge Instructions: Apply Lotrisone to periwound Prim Dressing: KerraCel Ag Gelling Fiber Dressing, 2x2 in (silver alginate) Every Other Day/30 Days ary Discharge Instructions: Apply silver alginate to wound bed as instructed Secondary Dressing: Woven Gauze Sponge, Non-Sterile 4x4 in Every Other Day/30 Days Discharge Instructions: Apply over primary dressing as directed. Secured With: [employer], 4.5x3.1 (in/yd) (Generic) Every Other Day/30 Days Discharge Instructions: Secure with Kerlix as directed. Secured With: 49M Medipore H Soft Cloth Surgical T ape, 4 x 10 (in/yd) (Generic) Every Other Day/30 Days Discharge Instructions: Secure with tape as directed. 04/27/2022: The dorsal foot wounds are both bigger today with [name] little bit of slough accumulation in small pockmarks. The right ankle wound is closed. The ischial wound has [name] little bit more slough accumulation and is otherwise unchanged. The abrasion on his posterior thigh has healed. I used [name] curette to debride eschar, slough, and dead skin from the dorsal foot wounds. I also used the curette to debride slough from the ischial wounds. There was some bleeding that I controlled with silver nitrate. We will continue using silver alginate to all of his wound sites. Follow-up in 2 weeks. Electronic Signature(s) Signed: 04/27/2022 9:51:05 AM By: Fredirick Maudlin MD FACS Entered By: Fredirick Maudlin on 04/27/2022 09:51:05 -------------------------------------------------------------------------------- HxROS Details Patient Name: Date of Service: Skiver, [name] [name] 04/27/2022 8:15 [name] Medical Record Number: [id number] Patient Account Number: [id number] Date of Birth/Sex: Treating RN: 1986-10-19 (36 y.o. [name] Primary Care Provider: [place], [place] Other  Clinician: Referring Provider: Treating Provider/Extender: Cornelia Copa [name]: 329 Information Obtained From Patient Ear/Nose/Mouth/Throat Medical History: Negative for: Chronic sinus problems/congestion; Middle ear problems Hematologic/Lymphatic Medical History: Negative for: Anemia; Hemophilia; Human Immunodeficiency Virus; Lymphedema; Sickle Cell Disease Respiratory Medical History: Positive for: Sleep Apnea - does not tolerate cpap Negative for: Aspiration; Asthma; Chronic Obstructive Pulmonary Disease (COPD); Pneumothorax; Tuberculosis Cardiovascular Medical History: Positive for: Hypertension - lisinopril HCTZ Negative for: Angina; Arrhythmia; Congestive Heart Failure; Coronary Artery Disease; Deep Vein Thrombosis; Hypotension; Myocardial Infarction; Peripheral Arterial Disease; Peripheral Venous Disease; Phlebitis; Vasculitis Gastrointestinal Medical History: Negative for: Cirrhosis ; Colitis; Crohns; Hepatitis [name]; Hepatitis B; Hepatitis C Endocrine Medical History: Negative for: Type I Diabetes; Type  II Diabetes Genitourinary Medical History: Negative for: End Stage Renal Disease Immunological Medical History: Negative for: Lupus Erythematosus; Raynauds; Scleroderma Integumentary (Skin) Medical History: Negative for: History of Burn Musculoskeletal Medical History: Negative for: Gout; Rheumatoid Arthritis; Osteoarthritis; Osteomyelitis Neurologic Medical History: Positive for: Paraplegia Negative for: Dementia; Neuropathy; Quadriplegia; Seizure Disorder Oncologic Medical History: Negative for: Received Chemotherapy; Received Radiation Psychiatric Medical History: Negative for: Anorexia/bulimia; Confinement Anxiety Immunizations Pneumococcal Vaccine: Received Pneumococcal Vaccination: No Immunization Notes: doesn't remember when last tetanus shot was Implantable Devices No devices added Hospitalization / Surgery  History Type of Hospitalization/Surgery cellulitis in leg left leg vein ablation Family and Social History Cancer: No; Diabetes: Yes - Father; Heart Disease: No; Hereditary Spherocytosis: No; Hypertension: Yes - Mother; Kidney Disease: No; Lung Disease: No; Stroke: No; Thyroid Problems: No; Tuberculosis: No; Former smoker; Marital Status - Married; Alcohol Use: Moderate; Drug Use: No History; Caffeine Use: Daily; Financial Concerns: No; Food, Clothing or Shelter Needs: No; Support System Lacking: No; Transportation Concerns: No Engineer, maintenance) Signed: 04/27/2022 12:24:33 PM By: Fredirick Maudlin MD FACS Signed: 04/28/2022 5:06:14 PM By: Baruch Gouty RN, BSN Entered By: Fredirick Maudlin on 04/27/2022 09:48:41 -------------------------------------------------------------------------------- [place] Details Patient Name: Date of Service: [name], [name] [name] 04/27/2022 Medical Record Number: [id number] Patient Account Number: [id number] Date of Birth/Sex: Treating RN: [birth year] (36 y.o. 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, stage 3 L97.821 Non-pressure chronic ulcer of other part of left lower leg limited to breakdown of skin L97.518 Non-pressure chronic ulcer of other part of right foot with other 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, 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 Non-pressure chronic ulcer of other part of left foot limited to breakdown of skin Facility Procedures CPT4 Code: [id number] IC L L L Description: 31517 - DEBRIDE WOUND 1ST 20 SQ CM OR < ICD-10 Diagnosis  Description L97.518 Non-pressure chronic ulcer of other part of right foot with other specified sever L97.521 Non-pressure chronic ulcer of other part of left foot limited to breakdown  of ski 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, stage 3 98 - DEBRIDE WOUND EA ADDL 20 SQ CM D-10 Diagnosis Description 97.521 Non-pressure chronic ulcer of other part of left foot limited to breakdown of skin 97.518 Non-pressure chronic ulcer of other  part of right foot with other specified severity 89.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, stage 3 Modifier: ity n Quantity: 1 1 Physician Procedures : CPT4 Code Description Modifier [id number] - WC PHYS LEVEL 3 - EST PT 25 ICD-10 Diagnosis Description L97.511 Non-pressure chronic ulcer of other part of right foot limited to breakdown of skin L97.521 Non-pressure chronic ulcer of other part of left  foot limited to breakdown of skin L97.518 Non-pressure chronic ulcer of other part of right foot with other specified severity 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, stage 3 Quantity: 1 : [id number] - WC PHYS DEBR WO ANESTH 20 SQ CM ICD-10 Diagnosis Description L97.518 Non-pressure chronic ulcer of other part of right foot with other specified severity L97.521 Non-pressure chronic ulcer of other part of left foot limited to breakdown  of 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

## 2022-05-11 ENCOUNTER — Encounter (HOSPITAL_BASED_OUTPATIENT_CLINIC_OR_DEPARTMENT_OTHER): Payer: BC Managed Care – PPO | Admitting: General Surgery

## 2022-05-11 DIAGNOSIS — M62838 Other muscle spasm: Secondary | ICD-10-CM | POA: Diagnosis not present

## 2022-05-11 DIAGNOSIS — L97512 Non-pressure chronic ulcer of other part of right foot with fat layer exposed: Secondary | ICD-10-CM | POA: Diagnosis not present

## 2022-05-11 DIAGNOSIS — G8220 Paraplegia, unspecified: Secondary | ICD-10-CM | POA: Diagnosis not present

## 2022-05-11 DIAGNOSIS — L97821 Non-pressure chronic ulcer of other part of left lower leg limited to breakdown of skin: Secondary | ICD-10-CM | POA: Diagnosis not present

## 2022-05-11 DIAGNOSIS — L89323 Pressure ulcer of left buttock, stage 3: Secondary | ICD-10-CM | POA: Diagnosis not present

## 2022-05-11 DIAGNOSIS — G8310 Monoplegia of lower limb affecting unspecified side: Secondary | ICD-10-CM | POA: Diagnosis not present

## 2022-05-11 DIAGNOSIS — I87332 Chronic venous hypertension (idiopathic) with ulcer and inflammation of left lower extremity: Secondary | ICD-10-CM | POA: Diagnosis not present

## 2022-05-11 DIAGNOSIS — I10 Essential (primary) hypertension: Secondary | ICD-10-CM | POA: Diagnosis not present

## 2022-05-11 DIAGNOSIS — L97522 Non-pressure chronic ulcer of other part of left foot with fat layer exposed: Secondary | ICD-10-CM | POA: Diagnosis not present

## 2022-05-12 NOTE — Progress Notes (Signed)
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 follow up evaluation of his LLE lateral ulceration and ulcer to right dorsal foot/toe space. He is voicing no complaints or concerns, He is tolerating compression wrap. 03/31/17 arrives today with [name] much healthier looking wound on the left lower extremity. We have been using Iodoflex for [name] prolonged period of time which has for the first time prepared and adequate looking wound bed although we have not had much in the way of wound dimension improvement. He also has [name] small wound between the first and second toe on the right 04/07/17; arrives today with [name] healthy-looking wound bed and at least the top 50% of this wound appears to be now her. No debridement was required I have changed him to [facility] last week after prolonged Iodoflex. He did not do well with Apligraf's. We've had [name] re-opening between the first and second toe on the right 04/14/17; arrives today with [name] healthier looking wound bed contractions and the top 50% of this wound and some on the lesser 50%. Wound bed appears healthy. The area between the first and second toe on the right still remains problematic 04/21/17; continued very gradual improvement. Using [facility] 04/28/17; continued very gradual improvement in the left lateral leg venous insufficiency wound. His periwound erythema is very mild. We have been using Hydrofera Blue. Wound is making progress especially in the superior  50% 05/05/17; he continues to have very gradual improvement in the left lateral venous insufficiency wound. Both in terms with an length rings are improving. I debrided this every 2 weeks with #5 curet and we have been using Hydrofera Blue and again making good progress With regards to the wounds between his right first and second toe which I thought might of been tinea pedis he is not making as much progress very dry scaly skin over the area. Also the area at the base of the left first and second toe in [name] similar condition 05/12/17; continued gradual improvement in the refractory left lateral venous insufficiency wound on the left. Dimension smaller. Surface still requiring debridement  using Hydrofera Blue 05/19/17; continued gradual improvement in the refractory left lateral venous ulceration. Careful inspection of the wound bed underlying rumination suggested some degree of epithelialization over the surface no debridement indicated. Continue Hydrofera Blue difficult areas between his toes first and third on the left than first and second on the right. I'm going to change to silver alginate from silver collagen. Continue ketoconazole as I suspect underlying tinea pedis 05/26/17; left lateral leg venous insufficiency wound. We've been using Hydrofera Blue. I believe that there is expanding epithelialization over the surface of the wound albeit not coming from the wound circumference. This is [name] bit of an odd situation in which the epithelialization seems to be coming from the surface of the wound rather than in the exact circumference. There is still small open areas mostly along the lateral margin of the wound. He has unchanged areas between the left first and second and the right first second toes which I been treating for tenia pedis 06/02/17; left lateral leg venous insufficiency wound. We have been using Hydrofera Blue. Somewhat smaller from the wound circumference. The surface of the wound remains [name]  bit on it almost epithelialized sedation in appearance. I use an open curette today debridement in the surface of all of this especially the edges Small open wounds remaining on the dorsal right first and second toe interspace and the plantar left first second toe and her face on the left 06/09/17; wound on the left lateral leg continues to be smaller but very gradual and very dry surface using Hydrofera Blue 06/16/17 requires weekly debridements now on the left lateral leg although this continues to contract. I changed to silver collagen last week because of dryness of the wound bed. Using Iodoflex to the areas on his first and second toes/web space bilaterally 06/24/17; patient with history of paraplegia also chronic venous insufficiency with lymphedema. Has [name] very difficult wound on the left lateral leg. This has been gradually reducing in terms of with but comes in with [name] very dry adherent surface. High switch to silver collagen [name] week or so ago with hydrogel to keep the area moist. This is been refractory to multiple dressing attempts. He also has areas in his first and second toes bilaterally in the anterior and posterior web space. I had been using Iodoflex here after [name] prolonged course of silver alginate with ketoconazole was ineffective [question tinea pedis] 07/14/17; patient arrives today with [name] very difficult adherent material over his left lateral lower leg wound. He also has surrounding erythema and poorly controlled edema. He was switched his Santyl last visit which the nurses are applying once during his doctor visit and once on [name] nurse visit. He was also reduced to 2 layer compression I'm not exactly sure of the issue here. 07/21/17; better surface today after 1 week of Iodoflex. Significant cellulitis that we treated last week also better. [Doxycycline] 07/28/17 better surface today with now 2 weeks of Iodoflex. Significant cellulitis treated with doxycycline. He has now completed the  doxycycline and he is back to his usual degree of chronic venous inflammation/stasis dermatitis. He reminds me he has had ablations surgery here 08/04/17; continued improvement with Iodoflex to the left lateral leg wound in terms of the surface of the wound although the dimensions are better. 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 Problematic areas on the plantar aspect of the first second toe web space on the left and the  dorsal aspect of the first second toe web space on the right. At one point I felt these were probably related to chronic fungal infections in treated him aggressively for this although we have not made any improvement here. 08/11/17; left lateral leg. Surface continues to improve with the Iodoflex although we are not seeing much improvement in overall wound dimensions. Areas on his plantar left foot and right foot show no improvement. In fact the right foot looks somewhat worse 08/18/17; left lateral leg. We changed to [facility] Blue last week after [name] prolonged course of Iodoflex which helps get the surface better. It appears that the wound with is improved. Continue with difficult areas on the left dorsal first second and plantar first second on the right 09/01/17; patient arrives in clinic today having had [name] temperature of 103 yesterday. He was seen in the ER and [facility]. The patient was concerned he could have cellulitis again in the right leg however they diagnosed him with [name] UTI and he is now on Keflex. He has [name] history of cellulitis which is been recurrent and difficult but this is been in the left leg, in the past 5 use doxycycline. He does in and out catheterizations at home which are risk factors for UTI 09/08/17; patient will be completing his Keflex this weekend. The erythema on the left leg is considerably better. He has [name] new wound today on the medial part of the right leg small superficial almost  looks like [name] skin tear. He has worsening of the area on the right dorsal first and second toe. His major area on the left lateral leg is better. Using Hydrofera Blue on all areas 09/15/17; gradual reduction in width on the long wound in the left lateral leg. No debridement required. He also has wounds on the plantar aspect of his left first second toe web space and on the dorsal aspect of the right first second toe web space. 09/22/17; there continues to be very gradual improvements in the dimensions of the left lateral leg wound. He hasn't round erythematous spot with might be pressure on his wheelchair. There is no evidence obviously of infection no purulence no warmth He has [name] dry scaled area on the plantar aspect of the left first second toe Improved area on the dorsal right first second toe. 09/29/17; left lateral leg wound continues to improve in dimensions mostly with an is still [name] fairly long but increasingly narrow wound. He has [name] dry scaled area on the plantar aspect of his left first second toe web space Increasingly concerning area on the dorsal right first second toe. In fact I am concerned today about possible cellulitis around this wound. The areas extending up his second toe and although there is deformities here almost appears to abut on the nailbed. 10/06/17; left lateral leg wound continues to make very gradual progress. Tissue culture I did from the right first second toe dorsal foot last time grew MRSA and enterococcus which was vancomycin sensitive. This was not sensitive to clindamycin or doxycycline. He is allergic to Zyvox and sulfa we have therefore arrange for him to have dalvance infusion tomorrow. He is had this in the past and tolerated it well 10/20/17; left lateral leg wound continues to make decent progress. This is certainly reduced in terms of with there is advancing epithelialization.The cellulitis in the right foot looks better although he still has [name] deep wound in  the dorsal aspect of the first second toe web space. Plantar left  first toe web space on the left I think is making some progress 10/27/17; left lateral leg wound continues to make decent progress. Advancing epithelialization.using Hydrofera Blue The right first second toe web space wound is better-looking using silver alginate Improvement in the left plantar first second toe web space. Again using silver alginate 11/03/17 left lateral leg wound continues to make decent progress albeit slowly. Using [facility] The right per second toe web space continues to be [name] very problematic looking punched out wound. I obtained [name] piece of tissue for deep culture I did extensively treated this for fungus. It is difficult to imagine that this is [name] pressure area as the patient states other than going outside he doesn't really wear shoes at home The left plantar first second toe web space looked fairly senescent. Necrotic edges. This required debridement change to [facility] Blue to all wound areas 11/10/17; left lateral leg wound continues to contract. Using Hydrofera Blue On the right dorsal first second toe web space dorsally. Culture I did of this area last week grew MRSA there is not an easy oral option in this patient was multiple antibiotic allergies or intolerances. This was only [name] rare culture isolate I'm therefore going to use Bactroban under silver alginate On the left plantar first second toe web space. Debridement is required here. This is also unchanged 11/17/17; left lateral leg wound continues to contract using Hydrofera Blue this is no longer the major issue. The major concern here is the right first second toe web space. He now has an open area going from dorsally to the plantar aspect. There is now wound on the inner lateral part of the first toe. Not [name] very viable surface on this. There is erythema spreading medially into the forefoot. No major change in the left first second toe plantar  wound 11/24/17; left lateral leg wound continues to contract using Hydrofera Blue. Nice improvement today The right first second toe web space all of this looks [name] lot less angry than last week. I have given him clindamycin and topical Bactroban for MRSA and terbinafine for the possibility of underlining tinea pedis that I could not control with ketoconazole. Looks somewhat better The area on the plantar left first second toe web space is weeping with dried debris around the wound 12/01/17; left lateral leg wound continues to contract he Hydrofera Blue. It is becoming thinner in terms of with nevertheless it is making good improvement. The right first second toe web space looks less angry but still [name] large necrotic-looking wounds starting on the plantar aspect of the right foot extending between the toes and now extensively on the base of the right second toe. I gave him clindamycin and topical Bactroban for MRSA anterior benefiting for the possibility of underlying tinea pedis. Not looking better today The area on the left first/second toe looks better. Debrided of necrotic debris 12/05/17* the patient was worked in urgently today because over the weekend he found blood on his incontinence bad when he woke up. He was found to have an ulcer by his wife who does most of his wound care. He came in today for [place] to look at this. He has not had [name] history of wounds in his buttocks in spite of his paraplegia. 12/08/17; seen in follow-up today at his usual appointment. He was seen earlier this week and found to have [name] new wound on his buttock. We also follow him for wounds on the left lateral leg, left first second  toe web space and right first second toe web space 12/15/17; we have been using Hydrofera Blue to the left lateral leg which has improved. The right first second toe web space has also improved. Left first second toe web space plantar aspect looks stable. The left buttock has worsened using Santyl.  Apparently the buttock has drainage 12/22/17; we have been using Hydrofera Blue to the left lateral leg which continues to improve now 2 small wounds separated by normal skin. He tells [place] he had [name] fever up to 100 yesterday he is prone to UTIs but has not noted anything different. He does in and out catheterizations. The area between the first and second toes today does not look good necrotic surface covered with what looks to be purulent drainage and erythema extending into the third toe. I had gotten this to something that I thought look better last time however it is not look good today. He also has [name] necrotic surface over the buttock wound which is expanded. I thought there might be infection under here so I removed [name] lot of the surface with [name] #5 curet though nothing look like it really needed culturing. He is been using Santyl to this area 12/27/17; his original wound on the left lateral leg continues to improve using Hydrofera Blue. I gave him samples of Baxdella although he was unable to take them out of fear for an allergic reaction ["lump in his throat"].the culture I did of the purulent drainage from his second toe last week showed both enterococcus and [name] set Enterobacter I was also concerned about the erythema on the bottom of his foot although paradoxically although this looks somewhat better today. 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 We've been using silver alginate here. They came up today that he sleeps in [name] recliner, I'm not sure why but I asked him to stop this 01/03/18; his original wound we've been using Hydrofera Blue is now separated into 2 areas. Ulcer on his left buttock is better he is off the recliner and sleeping in bed Finally both wound areas between his first and second toes also looks some better 01/10/18; his original wound on the left lateral leg is now separated into 2 wounds  we've been using Hydrofera Blue Ulcer on his left buttock has some drainage. There is [name] small probing site going into muscle layer superiorly.using silver alginate -He arrives today with [name] deep tissue injury on the left heel The wound on the dorsal aspect of his first second toe on the left looks [name] lot betterusing silver alginate ketoconazole The area on the first second toe web space on the right also looks [name] lot bette 01/17/18; his original wound on the left lateral leg continues to progress using Hydrofera Blue Ulcer on his left buttock also is smaller surface healthier except for [name] small probing site going into the muscle layer superiorly. 2.4 cm of tunneling in this area DTI on his left heel we have only been offloading. Looks better than last week no threatened open no evidence of infection the wound on the dorsal aspect of the first second toe on the left continues to look like it's regressing we have only been using silver alginate and terbinafine orally The area in the first second toe web space on the right also looks to be [name] lot better using silver alginate and terbinafine I think this was prompted  by tinea pedis 01/31/18; the patient was hospitalized in [place] last week apparently for [name] complicated UTI. He was discharged on cefepime he does in and out catheterizations. In the hospital he was discovered M I don't mild elevation of AST and ALT and the terbinafine was stopped.predictably the pressure ulcer on s his buttock looks betterusing silver alginate. The area on the left lateral leg also is better using Hydrofera Blue. The area between the first and second toes on the left better. First and second toes on the right still substantial but better. Finally the DTI on the left heel has held together and looks like it's resolving 02/07/18-he is here in follow-up evaluation for multiple ulcerations. He has new injury to the lateral aspect of the last issue [name] pressure ulcer, he states this  is from adhesive removal trauma. He states he has tried multiple adhesive products with no success. All other ulcers appear stable. The left heel DTI is resolving. We will continue with same treatment plan and follow-up next week. 02/14/18; follow-up for multiple areas. He has [name] new area last week on the lateral aspect of his pressure ulcer more over the posterior trochanter. The original pressure ulcer looks quite stable has healthy granulation. 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 Almost fully epithelialized on the original superior area using Hydrofera Blue DTI on the left heel has peeled off this week to reveal [name] small superficial wound under denuded skin and subcutaneous tissue Both areas between the first and second toes look better including nothing open on the left 02/21/18; The patient's wounds on his left ischial tuberosity and posterior left greater trochanter actually looked better. He has [name] large area of irritation around the area which I think is contact dermatitis. I am doubtful that this is fungal His original wound on the left lateral calf continues to improve we have been using Hydrofera Blue There is no open area in the left first second toe web space although there is [name] lot of thick callus The DTI on the left heel required debridement today of necrotic surface eschar and subcutaneous tissue using silver alginate Finally the area on the right first second toe webspace continues to contract using silver alginate and ketoconazole 02/28/18 Left ischial tuberosity wounds look better using silver alginate. Original wound on the left calf only has one small open area left using Hydrofera Blue DTI on the left heel required debridement mostly removing skin from around this wound surface. Using silver alginate The areas on the right first/second toe web space using silver alginate  and ketoconazole 03/08/18 on evaluation today patient appears to be doing decently well as best I can tell in regard to his wounds. This is the first time that I have seen him as he generally is followed by Dr. Dellia Nims. With that being said none of his wounds appear to be infected he does have an area where there is some skin covering what appears to be [name] new wound on the left dorsal surface of his great toe. This is right at the nail bed. With that being said I do believe that debrided away some of the excess skin can be of benefit in this regard. Otherwise he has been tolerating the dressing changes without complication. 03/14/18; patient arrives today with the multiplicity of wounds that we are following. He has not been systemically unwell Original wound on the left lateral calf  now only has 2 small open areas we've been using Hydrofera Blue which should continue The deep tissue injury on the left heel requires debridement today. We've been using silver alginate The left first second toe and the right first second toe are both are reminiscence what I think was tinea pedis. Apparently some of the callus Surface between the toes was removed last week when it started draining. Purulent drainage coming from the wound on the ischial tuberosity on the left. 03/21/18-He is here in follow-up evaluation for multiple wounds. 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 He tolerated debridement. The only change to last week's recommendations is to discontinue antifungal cream between toes. He will follow-up next week 03/28/18; following up for multiple wounds;Concern this week is streaking redness and swelling in the right foot. He is going to need antibiotics for this. 03/31/18; follow-up for right foot cellulitis. Streaking redness and swelling in the right foot on 03/28/18. 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 I  put him on clindamycin 300 mg every 6 and brought him in for [name] quick check. He has an open wound between his first and second toes on the right foot as [name] potential source. 04/04/18; Right foot cellulitis is resolving he is completing clindamycin. This is truly good news Left lateral calf wound which is initial wound only has one small open area inferiorly this is close to healing out. He has compression stockings. We will use Hydrofera Blue right down to the epithelialization of this Nonviable surface on the left heel which was initially pressure with [name] DTI. We've been using Hydrofera Blue. I'm going to switch this back to silver alginate Left first second toe/tinea pedis this looks better using silver alginate Right first second toe tinea pedis using silver alginate Large pressure ulcers on theLeft ischial tuberosity. Small wound here Looks better. I am uncertain about the surface over the large wound. Using silver alginate 04/11/18; Cellulitis in the right foot is resolved Left lateral calf wound which was his original wounds still has 2 tiny open areas remaining this is just about closed Nonviable surface on the left heel is better but still requires debridement Left first second toe/tinea pedis still open using silver alginate Right first second toe wound tinea pedis I asked him to go back to using ketoconazole and silver alginate Large pressure ulcers on the left ischial tuberosity this shear injury here is resolved. Wound is smaller. No evidence of infection using silver alginate 04/18/18; Patient arrives with an intense area of cellulitis in the right mid lower calf extending into the right heel area. Bright red and warm. Smaller area on the left anterior leg. He has [name] significant history of MRSA. He will definitely need antibioticsdoxycycline He now has 2 open areas on the left ischial tuberosity the original large wound and now [name] satellite area which I think was above his initial satellite  areas. Not [name] wonderful surface on this satellite area surrounding erythema which looks like pressure related. His left lateral calf wound again his original wound is just about closed Left heel pressure injury still requiring debridement Left first second toe looks [name] lot better using silver alginate Right first second toe also using silver alginate and ketoconazole cream also looks better 04/20/18; the patient was worked in early today out of concerns with his cellulitis on the right leg. I had started him on doxycycline. This was 2 days ago. His wife  was concerned about the swelling in the area. Also concerned about the left buttock. He has not been systemically unwell no fever chills. No nausea vomiting or diarrhea 04/25/18; the patient's left buttock wound is continued to deteriorate he is using Hydrofera Blue. He is still completing clindamycin for the cellulitis on the right leg although all of this looks better. 05/02/18 Left buttock wound still with [name] lot of drainage and [name] very tightly adherent fibrinous necrotic surface. He has [name] deeper area superiorly The left lateral calf wound is still closed DTI wound on the left heel necrotic surface especially the circumference using Iodoflex Areas between his left first second toe and right first second toe both look better. Dorsally and the right first second toe he had [name] necrotic surface although at smaller. In using silver alginate and ketoconazole. I did [name] culture last week which was [name] deep tissue culture of the reminiscence of the open wound on the right first second toe dorsally. This grew [name] few Acinetobacter and [name] few methicillin-resistant staph aureus. Nevertheless the area actually this week looked better. I didn't feel the need to specifically address this at least in terms of systemic antibiotics. 05/09/18; wounds are measuring larger more drainage per our intake. We are using Santyl covered with alginate on the large superficial buttock  wounds, Iodosorb on the left heel, ketoconazole and silver alginate to the dorsal first and second toes bilaterally. 05/16/18; The area on his left buttock better in some aspects although the area superiorly over the ischial tuberosity required an extensive debridement.using Santyl Left heel appears stable. Using Iodoflex The areas between his first and second toes are not bad however there is spreading erythema up the dorsal aspect of his left foot this looks like cellulitis again. He is insensate the erythema is really very brilliant.o Erysipelas He went to see an allergist days ago because he was itching part of this he had lab work done. This showed [name] white count of 15.1 with 70% neutrophils. Hemoglobin of 11.4 and [name] platelet count of 659,000. Last white count we had in Epic was [name] 2-1/2 years ago which was 25.9 but he was ill at the time. He was able to show me some lab work that was done by his primary physician the pattern is about the same. 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 But prompted me to go ahead and do x-rays of both feet and the pelvis rule out osteomyelitis. He also had [name] comprehensive metabolic panel this was reasonably normal his albumin was 3.7 liver function tests BUN/creatinine all normal 05/23/18; x-rays of both his feet from last week were negative for underlying pulmonary abnormality. The x-ray of his pelvis however showed mild irregularity in the left ischial which may represent some early osteomyelitis. The wound in the left ischial continues to get deeper clearly now exposed muscle. Each week necrotic surface material over this area. Whereas the rest of the wounds do not look so bad. The left ischial wound we have been using Santyl and calcium alginate T the left heel surface necrotic debris using Iodoflex o The left lateral leg is still healed Areas on the left dorsal foot and the right dorsal foot are about the same.  There is some inflammation on the left which might represent contact dermatitis, fungal dermatitis I am doubtful cellulitis although this looks better than last week 05/30/18; CT scan done at Hospital did not show any osteomyelitis or  abscess. Suggested the possibility of underlying cellulitis although I don't see [name] lot of evidence of this at the bedside The wound itself on the left buttock/upper thigh actually looks somewhat better. No debridement Left heel also looks better no debridement continue Iodoflex Both dorsal first second toe spaces appear better using Lotrisone. Left still required debridement 06/06/18; Intake reported some purulent looking drainage from the left gluteal wound. Using Santyl and calcium alginate Left heel looks better although still [name] nonviable surface requiring debridement The left dorsal foot first/second webspace actually expanding and somewhat deeper. I may consider doing [name] shave biopsy of this area Right dorsal foot first/second webspace appears stable to improved. Using Lotrisone and silver alginate to both these areas 06/13/18 Left gluteal surface looks better. Now separated in the 2 wounds. No debridement required. Still drainage. We'll continue silver alginate Left heel continues to look better with Iodoflex continue this for at least another week Of his dorsal foot wounds the area on the left still has some depth although it looks better than last week. We've been using Lotrisone and silver alginate 06/20/18 Left gluteal continues to look better healthy tissue Left heel continues to look better healthy granulation wound is smaller. He is using Iodoflex and his long as this continues continue the Iodoflex Dorsal right foot looks better unfortunately dorsal left foot does not. There is swelling and erythema of his forefoot. He had minor trauma to this several days ago but doesn't think this was enough to have caused any tissue injury. Foot looks like cellulitis, we  have had this problem before 06/27/18 on evaluation today patient appears to be doing [name] little worse in regard to his foot ulcer. 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 Both of which would really be his only options for treating this infection. In the past he has been given and effusion of Orbactiv. This is done very well for him in the past again it's one time dosing IV antibiotic therapy. Subsequently I do believe this is something we're gonna need to see about doing at this point in time. Currently his other wounds seem to be doing somewhat better in my pinion I'm pretty happy in that regard. 07/03/18 on evaluation today patient's wounds actually appear to be doing fairly well. He has been tolerating the dressing changes without complication. All in all he seems to be showing signs of improvement. In regard to the antibiotics he has been dealing with infectious disease since I saw him last week as far as getting this scheduled. In the end [facility]'s [facility] this coming Friday. In the meantime he has been continuing to perform the dressing changes in such as previous. There does not appear to be any evidence of infection worsengin at this time. 07/10/18; Since I last saw this man 2 weeks ago things have actually improved. IV antibiotics of resulted in less forefoot erythema although there is still some present. He is not systemically unwell Left buttock wounds 2 now have no depth there is increased epithelialization Using silver alginate Left heel still requires debridement using Iodoflex Left dorsal foot still with [name] sizable wound about the size of [name] border but healthy granulation Right dorsal foot still with [name] slitlike area using silver alginate 07/18/18; the patient's cellulitis in the left foot  is  improved in fact I think it is on its way to resolving. Left buttock wounds 2 both look better although the larger one has hypertension granulation we've been using silver alginate Left heel has some thick circumferential redundant skin over the wound edge which will need to be removed today we've been using Iodoflex Left dorsal foot is still [name] sizable wound required debridement using silver alginate The right dorsal foot is just about closed only [name] small open area remains here 07/25/18; left foot cellulitis is resolved Left buttock wounds 2 both look better. Hyper-granulation on the major area Left heel as some debris over the surface but otherwise looks [name] healthier wound. Using silver collagen Right dorsal foot is just about closed 07/31/18; arrives with our intake nurse worried about purulent drainage from the buttock. We had hyper-granulation here last week His buttock wounds 2 continue to look better Left heel some debris over the surface but measuring smaller. Right dorsal foot unfortunately has openings between the toes Left foot superficial wound looks less aggravated. 08/07/18 Buttock wounds continue to look better although some of her granulation and the larger medial wound. silver alginate Left heel continues to look [name] lot better.silver collagen Left foot superficial wound looks less stable. Requires debridement. He has [name] new wound superficial area on the foot on the lateral dorsal foot. Right foot looks better using silver alginate without Lotrisone 08/14/2018; patient was in the ER last week diagnosed with [name] UTI. He is now on Cefpodoxime and Macrodantin. Buttock wounds continued to be smaller. Using silver alginate Left heel continues to look better using silver collagen Left foot superficial wound looks as though it is improving Right dorsal foot area is just about healed. 08/21/2018; patient is completed his antibiotics for his UTI. He has 2 open areas on the buttocks. There is still  not closed although the surface looks satisfactory. Using silver alginate Left heel continues to improve using silver collagen The bilateral dorsal foot areas which are at the base of his first and second toes/possible tinea pedis are actually stable on the left but worse on the right. The area on the left required debridement of necrotic surface. After debridement I obtained [name] specimen for PCR culture. The right dorsal foot which is been just about healed last week is now reopened 08/28/2018; culture done on the left dorsal foot showed coag negative staph both staph epidermidis and Lugdunensis. I think this is worthwhile initiating systemic treatment. I will use doxycycline given his long list of allergies. The area on the left heel slightly improved but still requiring debridement. The large wound on the buttock is just about closed whereas the smaller one is larger. Using silver alginate in this area 09/04/2018; patient is completing his doxycycline for the left foot although this continues to be [name] very difficult wound area with very adherent necrotic debris. We are using silver alginate to all his wounds right foot left foot and the small wounds on his buttock, silver collagen on the left heel. 09/11/2018; once again this patient has intense erythema and swelling of the left forefoot. Lesser degrees of erythema in the right foot. He has [name] long list of allergies and intolerances. I will reinstitute doxycycline. 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 Using silver alginate Left heel also looks better using silver collagen Unfortunately both the areas on his feet look worse. The area on the left first second webspace is now  gone through to the plantar part of his foot. The area on the left foot anteriorly is irritated with erythema and swelling in the forefoot. 09/25/2018 His wound on the left plantar heel looks better. Using silver collagen The  area on the left buttock 2 small remnant areas. One is closed one is still open. Using silver alginate The areas between both his first and second toes look worse. This in spite of long-standing antifungal therapy with ketoconazole and silver alginate which should have antifungal activity He has small areas around his original wound on the left calf one is on the bottom of the original scar tissue and one superiorly both of these are small and superficial but again given wound history in this site this is worrisome 10/02/2018 Left plantar heel continues to gradually contract using silver collagen Left buttock wound is unchanged using silver alginate The areas on his dorsal feet between his first and second toes bilaterally look about the same. I prescribed clindamycin ointment to see if we can address chronic staph colonization and also the underlying possibility of erythrasma The left lateral lower extremity wound is actually on the lateral part of his ankle. Small open area here. We have been using silver alginate 10/09/2018; Left plantar heel continues to look healthy and contract. No debridement is required Left buttock slightly smaller with [name] tape injury wound just below which was new this week Dorsal feet somewhat improved I have been using clindamycin Left lateral looks lower extremity the actual open area looks worse although [name] lot of this is epithelialized. I am going to change to silver collagen today He has [name] lot more swelling in the right leg although this is not pitting not red and not particularly warm there is [name] lot of spasm in the right leg usually indicative of people with paralysis of some underlying discomfort. We have reviewed his vascular status from 2017 he had [name] left greater saphenous vein ablation. I wonder about referring him back to vascular surgery if the area on the left leg continues to deteriorate. 10/16/2018 in today for follow-up and management of multiple lower  extremity ulcers. His left Buttock wound is much lower smaller and almost closed completely. The wound to the left ankle has began to reopen with Epithelialization and some adherent slough. He has multiple new areas to the left foot and leg. The left dorsal foot without much improvement. Wound present between left great webspace and 2nd toe. Erythema and edema present right leg. Right LE ultrasound obtained on 10/10/18 was negative for DVT . 10/23/2018; Left buttock is closed over. Still dry macerated skin but there is no open wound. I suspect this is chronic pressure/moisture Left lateral calf is quite [name] bit worse than when I saw this last. There is clearly drainage here he has macerated skin into the left plantar heel. We will change the primary dressing to alginate Left dorsal foot has some improvement in overall wound area. Still using clindamycin and silver alginate Right dorsal foot about the same as the left using clindamycin and silver alginate The erythema in the right leg has resolved. He is DVT rule out was negative Left heel pressure area required debridement although the wound is smaller and the surface is health 10/26/2018 The patient came back in for his nurse check today predominantly because of the drainage coming out of the left lateral leg with [name] recent reopening of his original wound on the left lateral calf. He comes in today with [name] large  amount of surrounding erythema around the wound extending from the calf into the ankle and even in the area on the dorsal foot. He is not systemically unwell. He is not febrile. Nevertheless this looks like cellulitis. We have been using silver alginate to the area. I changed him to [name] regular visit and I am going to prescribe him doxycycline. The rationale here is [name] long list of medication intolerances and [name] history of MRSA. I did not see anything that I thought would provide [name] valuable culture 10/30/2018 Follow-up from his appointment 4 days ago  with really an extensive area of cellulitis in the left calf left lateral ankle and left dorsal foot. I put him on doxycycline. He has [name] long list of medication allergies which are true allergy reactions. 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 In any case he is [name] lot better today. The erythema especially in the anterior and lateral left calf is better. He still has left ankle erythema. He also is complaining about increasing edema in the right leg we have only been using Kerlix Coban and he has been doing the wraps at home. Finally he has [name] spotty rash on the medial part of his upper left calf which looks like folliculitis or perhaps wrap occlusion type injury. Small superficial macules not pustules 11/06/18 patient arrives today with again [name] considerable degree of erythema around the wound on the left lateral calf extending into the dorsal ankle and dorsal foot. This is [name] lot worse than when I saw this last week. He is on doxycycline really with not [name] lot of improvement. He has not been systemically unwell Wounds on the; left heel actually looks improved. Original area on the left foot and proximity to the first and second toes looks about the same. He has superficial areas on the dorsal foot, anterior calf and then the reopening of his original wound on the left lateral calf which looks about the same The only area he has on the right is the dorsal webspace first and second which is smaller. He has [name] large area of dry erythematous skin on the left buttock small open area here. 11/13/2018; the patient arrives in much better condition. The erythema around the wound on the left lateral calf is [name] lot better. Not sure whether this was the clindamycin or the TCA and ketoconazole or just in the improvement in edema control [stasis dermatitis]. In any case this is [name] lot better. The area on the left heel is very small and just about  resolved using silver collagen we have been using silver alginate to the areas on his dorsal feet 11/20/2018; his wounds include the left lateral calf, left heel, dorsal aspects of both feet just proximal to the first second webspace. He is stable to slightly improved. I did not think any changes to his dressings were going to be necessary 11/27/2018 he has [name] reopening on the left buttock which is surrounded by what looks like tinea or perhaps some other form of dermatitis. The area on the left dorsal foot has some erythema around it I have marked this area but I am not sure whether this is cellulitis or not. Left heel is not closed. Left calf the reopening is really slightly longer and probably worse 1/13; in general things look better and smaller except for the left dorsal foot. Area on the left heel is just about closed, left buttock looks better  only [name] small wound remains in the skin looks better [using Lotrisone] 1/20; the area on the left heel only has [name] few remaining open areas here. Left lateral calf about the same in terms of size, left dorsal foot slightly larger right lateral foot still not closed. The area on the left buttock has no open wound and the surrounding skin looks [name] lot better 1/27; the area on the left heel is closed. Left lateral calf better but still requiring extensive debridements. The area on his left buttock is closed. He still has the open areas on the left dorsal foot which is slightly smaller in the right foot which is slightly expanded. We have been using Iodoflex on these areas as well 2/3; left heel is closed. Left lateral calf still requiring debridement using Iodoflex there is no open area on his left buttock however he has dry scaly skin over [name] large area of this. Not really responding well to the Lotrisone. Finally the areas on his dorsal feet at the level of the first second webspace are slightly smaller on the right and about the same on the left. Both of these  vigorously debrided with Anasept and gauze 2/10; left heel remains closed he has dry erythematous skin over the left buttock but there is no open wound here. Left lateral leg has come in and with. Still requiring debridement we have been using Iodoflex here. Finally the area on the left dorsal foot and right dorsal foot are really about the same extremely dry callused fissured areas. He does not yet have [name] dermatology appointment 2/17; left heel remains closed. He has [name] new open area on the left buttock. The area on the left lateral calf is bigger longer and still covered in necrotic debris. No major change in his foot areas bilaterally. I am awaiting for [name] dermatologist to look on this. We have been using ketoconazole I do not know that this is been doing any good at all. 2/24; left heel remains closed. The left buttock wound that was new reopening last week looks better. The left lateral calf appears better also although still requires debridement. The major area on his foot is the left first second also requiring debridement. We have been putting Prisma on all wounds. I do not believe that the ketoconazole has done too much good for his feet. He will use Lotrisone I am going to give him [name] 2-week course of terbinafine. We still do not have [name] dermatology appointment 3/2 left heel remains closed however there is skin over bone in this area I pointed this out to him today. The left buttock wound is epithelialized but still does not look completely stable. The area on the left leg required debridement were using silver collagen here. With regards to his feet we changed to Lotrisone last week and silver alginate. 3/9; left heel remains closed. Left buttock remains closed. The area on the right foot is essentially closed. The left foot remains unchanged. Slightly smaller on the left lateral calf. Using silver collagen to both of these areas 3/16-Left heel remains closed. Area on right foot is closed.  Left lateral calf above the lateral malleolus open wound requiring debridement with easy bleeding. Left dorsal wound proximal to first toe also debrided. Left ischial area open new. Patient has been using Prisma with wrapping every 3 days. Dermatology appointment is apparently tomorrow.Patient has completed his terbinafine 2-week course with some apparent improvement according to him, there is still flaking and dry skin  in his foot on the left 3/23; area on the right foot is reopened. The area on the left anterior foot is about the same still [name] very necrotic adherent surface. He still has the area on the left leg and reopening is on the left buttock. He apparently saw dermatology although I do not have [name] note. According to the patient who is usually fairly well informed they did not have any good ideas. Put him on oral terbinafine which she is been on before. 3/30; using silver collagen to all wounds. Apparently his dermatologist put him on doxycycline and rifampin presumably some culture grew staph. I do not have this result. He remains on terbinafine although I have used terbinafine on him before 4/6; patient has had [name] fairly substantial reopening on the right foot between the first and second toes. He is finished his terbinafine and I believe is on doxycycline and rifampin still as prescribed by dermatology. We have been using silver collagen to all his wounds although the patient reports that he thinks silver alginate does better on the wounds on his buttock. 4/13; the area on his left lateral calf about the same size but it did not require debridement. Left dorsal foot just proximal to the webspace between the first and second toes is about the same. Still nonviable surface. I note some superficial bronze discoloration of the dorsal part of his foot Right dorsal foot just proximal to the first and second toes also looks about the same. I still think there may be the same discoloration I noted  above on the left Left buttock wound looks about the same 4/20; left lateral calf appears to be gradually contracting using silver collagen. 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 The left dorsal foot wound is debrided of tightly adherent necrotic debris and really cleans up quite nicely. The right area is worse with expansion. I did not debride this it is now over the base of the second toe The area on his left buttock is smaller no debridement is required using silver collagen 5/4; left calf continues to make good progress. He arrives with erythema around the wounds on his dorsal foot which even extends to the plantar aspect. Very concerning for coexistent infection. He is finished the erythromycin I gave him for possible erythrasma this does not seem to have helped. The area on the left foot is about the same base of the dorsal toes Is area on the buttock looks improved on the left 5/11; left calf and left buttock continued to make good progress. Left foot is about the same to slightly improved. Major problem is on the right foot. He has not had an x-ray. Deep tissue culture I did last week showed both Enterobacter and E. coli. I did not change the doxycycline I put him on empirically although neither 1 of these were plated to doxycycline. He arrives today with the erythema looking worse on both the dorsal and plantar foot. Macerated skin on the bottom of the foot. he has not been systemically unwell 5/18-Patient returns at 1 week, left calf wound appears to be making some progress, left buttock wound appears slightly worse than last time, left foot wound looks slightly better, right foot redness is marginally better. X-ray of both feet show no air or evidence of osteomyelitis. Patient is finished his Omnicef and terbinafine. He continues to have macerated skin on the bottom of the left foot as well as right 5/26; left  calf wound is  better, left buttock wound appears to have multiple small superficial open areas with surrounding macerated skin. X-rays that I did last time showed no evidence of osteomyelitis in either foot. He is finished cefdinir and doxycycline. I do not think that he was on terbinafine. He continues to have [name] large superficial open area on the right foot anterior dorsal and slightly between the first and second toes. I did send him to dermatology 2 months ago or so wondering about whether they would do [name] fungal scraping. I do not believe they did but did do [name] culture. We have been using silver alginate to the toe areas, he has been using antifungals at home topically either ketoconazole or Lotrisone. We are using silver collagen on the left foot, silver alginate on the right, silver collagen on the left lateral leg and silver alginate on the left buttock 6/1; left buttock area is healed. We have the left dorsal foot, left lateral leg and right dorsal foot. We are using silver alginate to the areas on both feet and silver collagen to the area on his left lateral calf 6/8; the left buttock apparently reopened late last week. He is not really sure how this happened. He is tolerating the terbinafine. Using silver alginate to all wounds 6/15; left buttock wound is larger than last week but still superficial. Came in the clinic today with [name] report of purulence from the left lateral leg I did not identify any infection Both areas on his dorsal feet appear to be better. He is tolerating the terbinafine. Using silver alginate to all wounds 6/22; left buttock is about the same this week, left calf quite [name] bit better. His left foot is about the same however he comes in with erythema and warmth in the right forefoot once again. Culture that I gave him in the beginning of May showed Enterobacter and E. coli. I gave him doxycycline and things seem to improve although neither 1 of these organisms was specifically  plated. 6/29; left buttock is larger and dry this week. Left lateral calf looks to me to be improved. Left dorsal foot also somewhat improved right foot completely unchanged. The erythema on the right foot is still present. He is completing the Ceftin dinner that I gave him empirically [see discussion above.) 7/6 - All wounds look to be stable and perhaps improved, the left buttock wound is slightly smaller, per patient bleeds easily, completed ceftin, the right foot redness is less, he is on terbinafine 7/13; left buttock wound about the same perhaps slightly narrower. Area on the left lateral leg continues to narrow. Left dorsal foot slightly smaller right foot about the same. We are using silver alginate on the right foot and Hydrofera Blue to the areas on the left. Unna boot on the left 2 layer compression on the right 7/20; left buttock wound absolutely the same. Area on lateral leg continues to get better. Left dorsal foot require debridement as did the right no major change in the 7/27; left buttock wound the same size necrotic debris over the surface. The area on the lateral leg is closed once again. His left foot looks better right foot about the same although there is some involvement now of the posterior first second toe area. He is still on terbinafine which I have given him for [name] month, not certain [name] centimeter major change 06/25/19-All wounds appear to be slightly improved according to report, left buttock wound looks clean, both foot wounds  have minimal to no debris the right dorsal foot has minimal slough. We are using Hydrofera Blue to the left and silver alginate to the right foot and ischial wound. 8/10-Wounds all appear to be around the same, the right forefoot distal part has some redness which was not there before, however the wound looks clean and small. Ischial wound looks about the same with no changes 8/17; his wound on the left lateral calf which was his original chronic  venous insufficiency wound remains closed. Since I last saw him the areas on the left dorsal foot right dorsal foot generally appear better but require debridement. The area on his left initial tuberosity appears somewhat larger to me perhaps hyper granulated and bleeds very easily. We have been using Hydrofera Blue to the left dorsal foot and silver alginate to everything else 8/24; left lateral calf remains closed. The areas on his dorsal feet on the webspace of the first and second toes bilaterally both look better. The area on the left buttock which is the pressure ulcer stage II slightly smaller. I change the dressing to Hydrofera Blue to all areas 8/31; left lateral calf remains closed. The area on his dorsal feet bilaterally look better. Using Hydrofera Blue. Still requiring debridement on the left foot. No change in the left buttock pressure ulcers however 9/14; left lateral calf remains closed. Dorsal feet look quite [name] bit better than 2 weeks ago. Flaking dry skin also [name] lot better with the ammonium lactate I gave him 2 weeks ago. The area on the left buttock is improved. He states that his Roho cushion developed [name] leak and he is getting [name] new one, in the interim he is offloading this vigorously 9/21; left calf remains closed. Left heel which was [name] possible DTI looks better this week. He had macerated tissue around the left dorsal foot right foot looks satisfactory and improved left buttock wound. I changed his dressings to his feet to silver alginate bilaterally. Continuing Hydrofera Blue on the left buttock. 9/28 left calf remains closed. Left heel did not develop anything [possible DTI] dry flaking skin on the left dorsal foot. Right foot looks satisfactory. Improved left buttock wound. We are using silver alginate on his feet Hydrofera Blue on the buttock. I have asked him to go back to the Lotrisone on his feet including the wounds and surrounding areas 10/5; left calf remains closed.  The areas on the left and right feet about the same. [name] lot of this is epithelialized however debris over the remaining open areas. He is using Lotrisone and silver alginate. The area on the left buttock using Hydrofera Blue 10/26. Patient has been out for 3 weeks secondary to Covid concerns. He tested negative but I think his wife tested positive. He comes in today with the left foot substantially worse, right foot about the same. Even more concerning he states that the area on his left buttock closed over but then reopened and is considerably deeper in one aspect than it was before [stage III wound] 11/2; left foot really about the same as last week. Quarter sized wound on the dorsal foot just proximal to the first second toes. Surrounding erythema with areas of denuded epithelium. This is not really much different looking. Did not look like cellulitis this time however. Right foot area about the same.. We have been using silver alginate alginate on his toes Left buttock still substantial irritated skin around the wound which I think looks somewhat better. We have been  using Hydrofera Blue here. 11/9; left foot larger than last week and [name] very necrotic surface. Right foot I think is about the same perhaps slightly smaller. Debris around the circumference also addressed. Unfortunately on the left buttock there is been [name] decline. Satellite lesions below the major wound distally and now [name] an additional one posteriorly we have been using Hydrofera Blue but I think this is [name] pressure issue 11/16; left foot ulcer dorsally again [name] very adherent necrotic surface. Right foot is about the same. Not much change in the pressure ulcer on his left buttock. 11/30; left foot ulcer dorsally basically the same as when I saw him 2 weeks ago. Very adherent fibrinous debris on the wound surface. Patient reports [name] lot of drainage as well. The character of this wound has changed completely although it has always been  refractory. We have been using Iodoflex, patient changed back to alginate because of the drainage. Area on his right dorsal foot really looks benign with [name] healthier surface certainly [name] lot better than on the left. Left buttock wounds all improved using Hydrofera Blue 12/7; left dorsal foot again no improvement. Tightly adherent debris. PCR culture I did last week only showed likely skin contaminant. I have gone ahead and done [name] punch biopsy of this which is about the last thing in terms of investigations I can think to do. He has known venous insufficiency and venous hypertension and this could be the issue here. The area on the right foot is about the same left buttock slightly worse according to our intake nurse secondary to [facility] Blue sticking to the wound 12/14; biopsy of the left foot that I did last time showed changes that could be related to wound healing/chronic stasis dermatitis phenomenon no neoplasm. We have been using silver alginate to both feet. I change the one on the left today to Sorbact and silver alginate to his other 2 wounds 12/28; the patient arrives with the following problems; Major issue is the dorsal left foot which continues to be [name] larger deeper wound area. Still with [name] completely nonviable surface Paradoxically the area mirror image on the right on the right dorsal foot appears to be getting better. He had some loss of dry denuded skin from the lower part of his original wound on the left lateral calf. Some of this area looked [name] little vulnerable and for this reason we put him in wrap that on this side this week The area on his left buttock is larger. He still has the erythematous circular area which I think is [name] combination of pressure, sweat. This does not look like cellulitis or fungal dermatitis 11/26/2019; -Dorsal left foot large open wound with depth. Still debris over the surface. Using Sorbact The area on the dorsal right foot paradoxically has closed  over He has [name] reopening on the left ankle laterally at the base of his original wound that extended up into the calf. This appears clean. The left buttock wound is smaller but with very adherent necrotic debris over the surface. We have been using silver alginate here as well The patient had arterial studies done in 2017. He had biphasic waveforms at the dorsalis pedis and posterior tibial bilaterally. ABI in the left was 1.17. Digit waveforms were dampened. He has slight spasticity in the great toes I do not think [name] TBI would be possible 1/11; the patient comes in today with [name] sizable reopening between the first and second toes on the right. This is  not exactly in the same location where we have been treating wounds previously. According to our intake nurse this was actually fairly deep but 0.6 cm. The area on the left dorsal foot looks about the same the surface is somewhat cleaner using Sorbact, his MRI is in 2 days. We have not managed yet to get arterial studies. The new reopening on the left lateral calf looks somewhat better using alginate. The left buttock wound is about the same using alginate 1/18; the patient had his ARTERIAL studies which were quite normal. ABI in the right at 1.13 with triphasic/biphasic waveforms on the left ABI 1.06 again with triphasic/biphasic waveforms. It would not have been possible to have done [name] toe brachial index because of spasticity. We have been using Sorbac to the left foot alginate to the rest of his wounds on the right foot left lateral calf and left buttock 1/25; arrives in clinic with erythema and swelling of the left forefoot worse over the first MTP area. This extends laterally dorsally and but also posteriorly. Still has an area on the left lateral part of the lower part of his calf wound it is eschared and clearly not closed. Area on the left buttock still with surrounding irritation and erythema. Right foot surface wound dorsally. The area between  the right and first and second toes appears better. 2/1; The left foot wound is about the same. Erythema slightly better I gave him [name] week of doxycycline empirically Right foot wound is more extensive extending between the toes to the plantar surface Left lateral calf really no open surface on the inferior part of his original wound however the entire area still looks vulnerable Absolutely no improvement in the left buttock wound required debridement. 2/8; the left foot is about the same. Erythema is slightly improved I gave him clindamycin last week. Right foot looks better he is using Lotrimin and silver alginate He has [name] breakdown in the left lateral calf. Denuded epithelium which I have removed Left buttock about the same were using Hydrofera Blue 2/15; left foot is about the same there is less surrounding erythema. Surface still has tightly adherent debris which I have debriding however not making any progress Right foot has [name] substantial wound on the medial right second toe between the first and second webspace. Still an open area on the left lateral calf distal area. Buttock wound is about the same 2/22; left foot is about the same less surrounding erythema. Surface has adherent debris. Polymen Ag Right foot area significant wound between the first and second toes. We have been using silver alginate here Left lateral leg polymen Ag at the base of his original venous insufficiency wound Left buttock some improvement here 3/1; Right foot is deteriorating in the first second toe webspace. Larger and more substantial. We have been using silver alginate. Left dorsal foot about the same markedly adherent surface debris using PolyMem Ag Left lateral calf surface debris using PolyMem AG Left buttock is improved again using PolyMem Ag. He is completing his terbinafine. The erythema in the foot seems better. He has been on this for 2 weeks 3/8; no improvement in any wound area in fact he has [name]  small open area on the dorsal midfoot which is new this week. He has not gotten his foot x-rays yet 3/15; his x-rays were both negative for osteomyelitis of both feet. No major change in any of his wounds on the extremities however his buttock wounds are better. We have been  using polymen on the buttocks, left lower leg. Iodoflex on the left foot and silver alginate on the right 3/22; arrives in clinic today with the 2 major issues are the improvement in the left dorsal foot wound which for once actually looks healthy with [name] nice healthy wound surface without debridement. Using Iodoflex here. Unfortunately on the left lateral calf which is in the distal part of his original wound he came to the clinic here for there was purulent drainage noted some increased breakdown scattered around the original area and [name] small area proximally. We we are using polymen here will change to silver alginate today. His buttock wound on the left is better and I think the area on the right first second toe webspace is also improved 3/29; left dorsal foot looks better. Using Iodoflex. Left ankle culture from deterioration last time grew E. coli, Enterobacter and Enterococcus. I will give him [name] course of cefdinir although that will not cover Enterococcus. The area on the right foot in the webspace of the first and second toe lateral first toe looks better. The area on his buttock is about healed Vascular appointment is on April 21. This is to look at his venous system vis--vis continued breakdown of the wounds on the left including the left lateral leg and left dorsal foot he. He has had previous ablations on this side 4/5; the area between the right first and second toes lateral aspect of the first toe looks better. Dorsal aspect of the left first toe on the left foot also improved. Unfortunately the left lateral lower leg is larger and there is [name] second satellite wound superiorly. The usual superficial abrasions on the  left buttock overall better but certainly not closed 4/12; the area between the right first and second toes is improved. Dorsal aspect of the left foot also slightly smaller with [name] vibrant healthy looking surface. No real change in the left lateral leg and the left buttock wound is healed He has an unaffordable co-pay for Apligraf. Appointment with vein and vascular with regards to the left leg venous part of the circulation is on 4/21 4/19; we continue to see improvement in all wound areas. Although this is minor. He has his vascular appointment on 4/21. The area on the left buttock has not reopened although right in the center of this area the skin looks somewhat threatened 4/26; the left buttock is unfortunately reopened. In general his left dorsal foot has [name] healthy surface and looks somewhat smaller although it was not measured as such. The area between his first and second toe webspace on the right as [name] small wound against the first toe. The patient saw vascular surgery. The real question I was asking was about the small saphenous vein on the left. He has previously ablated left greater saphenous vein. Nothing further was commented on on the left. Right greater saphenous vein without reflux at the saphenofemoral junction or proximal thigh there was no indication for ablation of the right greater saphenous vein duplex was negative for DVT bilaterally. They did not think there was anything from [name] vascular surgery point of view that could be offered. They ABIs within normal limits 5/3; only small open area on the left buttock. The area on the left lateral leg which was his original venous reflux is now 2 wounds both which look clean. We are using Iodoflex on the left dorsal foot which looks healthy and smaller. He is down to [name] very tiny area between the  right first and second toes, using silver alginate 5/10; all of his wounds appear better. We have much better edema control in 4 layer compression  on the left. This may be the factor that is allowing the left foot and left lateral calf to heal. He has external compression garments at home 04/14/20-All of his wounds are progressing well, the left forefoot is practically closed, left ischium appears to be about the same, right toe webspace is also smaller. The left lateral leg is about the same, continue using Hydrofera Blue to this, silver alginate to the ischium, Iodoflex to the toe space on the right 6/7; most of his wounds outside of the left buttock are doing well. The area on the left lateral calf and left dorsal foot are smaller. The area on the right foot in between the first and second toe webspace is barely visible although he still says there is some drainage here is the only reason I did not heal this out. Unfortunately the area on the left buttock almost looks like he has [name] skin tear from tape. He has open wound and then [name] large flap of skin that we are trying to get adherence over an area just next to the remaining wound 6/21; 2 week follow-up. I believe is been here for nurse visits. Miraculously the area between his first and second toes on the left dorsal foot is closed over. Still open on the right first second web space. The left lateral calf has 2 open areas. Distally this is more superficial. The proximal area had [name] little more depth and required debridement of adherent necrotic material. His buttock wound is actually larger we have been using silver alginate here 6/28; the patient's area on the left foot remains closed. Still open wet area between the first and second toes on the right and also extending into the plantar aspect. We have been using silver alginate in this location. He has 2 areas on the left lower leg part of his original long wounds which I think are better. We have been using Hydrofera Blue here. Hydrofera Blue to the left buttock which is stable 7/12; left foot remains closed. Left ankle is closed. May be [name]  small area between his right first and second toes the only truly open area is on the left buttock. We have been using Hydrofera Blue here 7/19; patient arrives with marked deterioration especially in the left foot and ankle. We did not put him in [name] compression wrap on the left last week in fact he wore his juxta lite stockings on either side although he does not have an underlying stocking. He has [name] reopening on the left dorsal foot, left lateral ankle and [name] new area on the right dorsal ankle. More worrisome is the degree of erythema on the left foot extending on the lateral foot into the lateral lower leg on the left 7/26; the patient had erythema and drainage from the lateral left ankle last week. Culture of this grew MRSA resistant to doxycycline and clindamycin which are the 2 antibiotics we usually use with this patient who has multiple antibiotic allergies including linezolid, trimethoprim sulfamethoxazole. I had give him an empiric doxycycline and he comes in the area certainly looks somewhat better although it is blotchy in his lower leg. He has not been systemically unwell. He has had areas on the left dorsal foot which is [name] reopening, chronic wounds on the left lateral ankle. Both of these I think are secondary to  chronic venous insufficiency. The area between his first and second toes is closed as far as I can tell. He had [name] new wrap injury on the right dorsal ankle last week. Finally he has an area on the left buttock. We have been using silver alginate to everything except the left buttock we are using Hydrofera Blue 06/30/20-Patient returns at 1 week, has been given [name] sample dose pack of NUZYRA which is [name] tetracycline derivative [omadacycline], patient has completed those, we have been using silver alginate to almost all the wounds except the left ischium where we are using Hydrofera Blue all of them look better 8/16; since I last saw the patient he has been doing well. The area on the left  buttock, left lateral ankle and left foot are all closed today. He has completed the [place] I gave him last time and tolerated this well. He still has open areas on the right dorsal ankle and in the right first second toe area which we are using silver alginate. 8/23; we put him in his bilateral external compression stockings last week as he did not have anything open on either leg except for concerning area between the right first and second toe. He comes in today with an area on the left dorsal foot slightly more proximal than the original wound, the left lateral foot but this is actually [name] continuation of the area he had on the left lateral ankle from last time. As well he is opened up on the left buttock again. 8/30; comes in today with things looking [name] lot better. The area on the left lower ankle has closed down as has the left foot but with eschar in both areas. The area on the dorsal right ankle is also epithelialized. Very little remaining of the left buttock wound. We have been using silver alginate on all wound areas 9/13; the area in the first second toe webspace on the right has fully epithelialized. He still has some vulnerable epithelium on the right and the ankle and the dorsal foot. He notes weeping. He is using his juxta lite stocking. On the left again the left dorsal foot is closed left lateral ankle is closed. We went to the juxta lite stocking here as well. Still vulnerable in the left buttock although only 2 small open areas remain here 9/27; 2-week follow-up. We did not look at his left leg but the patient says everything is closed. He is [name] bit disturbed by the amount of edema in his left foot he is using juxta lite stockings but asking about over the toes stockings which would be 30/40, will talk to him next time. According to him there is no open wound on either the left foot or the left ankle/calf He has an open area on the dorsal right calf which I initially point [name] wrap  injury. He has superficial remaining wound on the left ischial tuberosity been using silver alginate although he says this sticks to the wound 10/5; we gave him 2-week follow-up but he called yesterday expressing some concerns about his right foot right ankle and the left buttock. He came in early. There is still no open areas on the left leg and that still in his juxta lite stocking 10/11; he only has 1 small area on the left buttock that remains measuring millimeters 1 mm. Still has the same irritated skin in this area. We recommended zinc oxide when this eventually closes and pressure relief is meticulously is he can do  this. He still has an area on the dorsal part of his right first through third toes which is [name] bit irritated and still open and on the dorsal ankle near the crease of the ankle. We have been using silver alginate and using his own stocking. He has nothing open on the left leg or foot 10/25; 2-week follow-up. Not nearly as good on the left buttock as I was hoping. For open areas with 5 looking threatened small. He has the erythematous irritated chronic skin in this area. 1 area on the right dorsal ankle. He reports this area bleeds easily Right dorsal foot just proximal to the base of his toes We have been using silver alginate. 11/8; 2-week follow-up. Left buttock is about the same although I do not think the wounds are in the same location we have been using silver alginate. I have asked him to use zinc oxide on the skin around the wounds. He still has [name] small area on the right dorsal ankle he reports this bleeds easily Right dorsal foot just proximal to the base of the toes does not have anything open although the skin is very dry and scaly He has [name] new opening on the nailbed of the left great toe. Nothing on the left ankle 11/29; 3-week follow-up. Left buttock has 2 open areas. And washing of these wounds today started bleeding easily. Suggesting very friable tissue. We  have been using silver alginate. Right dorsal ankle which I thought was initially [name] wrap injury we have been using silver alginate. Nothing open between the toes that I can see. He states the area on the left dorsal toe nailbed healed after the last visit in 2 or 3 days 12/13; 3-week follow-up. His left buttock now has 3 open areas but the original 2 areas are smaller using polymen here. Surrounding skin looks better. The right dorsal ankle is closed. He has [name] small opening on the right dorsal foot at the level of the third toe. In general the skin looks better here. He is wearing his juxta lite stocking on the left leg says there is nothing open 11/24/2020; 3 weeks follow-up. His left buttock still has the 3 open areas. We have been using polymen but due to lack of response he changed to [facility] area. Surrounding skin is dry erythematous and irritated looking. There is no evidence of infection either bacterial or fungal however there is loss of surface epithelium He still has very dry skin in his foot causing irritation and erythema on the dorsal part of his toes. This is not responded to prolonged courses of antifungal simply looks dry and irritated 1/24; left buttock area still looks about the same he was unable to find the triad ointment that we had suggested. The area on the right lower leg just above the dorsal ankle has reopened and the areas on the right foot between the first second and second third toes and scaling on the bottom of the foot has been about the same for quite some time now. been using silver alginate to all wound areas 2/7; left buttock wound looked quite good although not much smaller in terms of surface area surrounding skin looks better. Only [name] few dry flaking areas on the right foot in between the first and second toes the skin generally looks better here [ammonium lactate]. Finally the area on the right dorsal ankle is closed 2/21; There is no open area on the  right foot even between the right  first and second toe. Skin around this area dorsally and plantar aspects look better. He has [name] reopening of the area on the right ankle just above the crease of the ankle dorsally. I continue to think that this is probably friction from spasms may be even this time with his stocking under the compression stockings. Wounds on his left buttock look about the same there [name] couple of areas that have reopened. He has [name] total square area of loss of epithelialization. This does not look like infection it looks like [name] contact dermatitis but I just cannot determine to what 3/14; there is nothing on the right foot between the first and second toes this was carefully inspected under illumination. Some chronic irritation on the dorsal part of his foot from toes 1-3 at the base. Nothing really open here substantially. Still has an area on the right foot/ankle that is actually larger and hyper granulated. His buttock area on the left is just about closed however he has chronic inflammation with loss of the surface epithelial layer 3/28; 2-week follow-up. In clinic today with [name] new wound on the left anterior mid tibia. Says this happened about 2 weeks ago. He is not really sure how wonders about the spasticity of his legs at night whether that could have caused this other than that he does not have [name] good idea. He has been using topical antibiotics and silver alginate. The area on his right dorsal ankle seems somewhat better. Finally everything on his left buttock is closed. 4/11; 2-week follow-up. All of his wounds are better except for the area over the ischium and left buttock which have opened up widely again. At least part of this is covered in necrotic fibrinous material another part had rolled nonviable skin. The area on the right ankle, left anterior mid tibia are both [name] lot better. He had no open wounds on either foot including the areas between the first and second  toes 4/25; patient presents for 2-week follow-up. He states that the wounds are overall stable. He has no complaints today and states he is using Hydrofera Blue to open wounds. 5/9; have not seen this man in over [name] month. For my memory he has open areas on the left mid tibia and right ankle. T oday he has new open area on the right dorsal foot which we have not had [name] problem with recently. He has the sustained area on the left buttock He is also changed his insurance at the beginning of the year [employer]. We will need prior authorizations for debridement 5/23; patient presents for 2-week follow-up. He has prior authorizations for debridement. He denies any issues in the past 2 weeks with his wound care. He has been using Hydrofera Blue to all the wounds. He does report [name] circular rash to the upper left leg that is new. He denies acute signs of infection. 6/6; 2-week follow-up. The patient has open wounds on the left buttock which are worse than the last time I saw this about [name] month ago. He also has [name] new area to me on the left anterior mid tibia with some surrounding erythema. The area on the dorsal ankle on the right is closed but I think this will be [name] friction injury every time this area is exposed to either our wraps or his compression stockings caused by unrelenting spasms in this leg. 6/20; 2-week follow-up. The patient has open wounds on the left buttock which is about the same. Using [facility]  Blue here. - The left mid tibia has [name] static amount of surrounding erythema. Also [name] raised area in the center. We have been using Hydrofera Blue here. Finally he has broken down in his dorsal right foot extending between the first and second toes and going to the base of the first and second toe webspace. I have previously assumed that this was severe venous hypertension 7/5; 2-week follow-up The left buttock wound actually looks better. We are using Hydrofera Blue. He has extensive skin  irritation around this area and I have not really been able to get that any better. I have tried Lotrisone i.e. antifungals and steroids. More most recently we have just been using Coloplast really looks about the same. The left mid tibia which was new last week culture to have very resistant staph aureus. Not only methicillin-resistant but doxycycline resistant. The patient has [name] plethora of antibiotic allergies including sulfa, linezolid. I used topical bacitracin on this but he has not started this yet. In addition he has an expanding area of erythema with [name] wound on the dorsal right foot. I did [name] deep tissue culture of this area today 7/12; Left buttock area actually looks better surrounding skin also looks less irritated. Left mid tibia looks about the same. He is using bacitracin this is not worse Right dorsal foot looks about the same as well. The left first toe also looks about the same 7/19; left buttock wound continues to improve in terms of open areas Left mid tibia is still concerning amount of swelling he is using bacitracin Dorsal left first toe somewhat smaller Right dorsal foot somewhat smaller 7/25; left buttock wound actually continues to improve Left mid tibia area has less swelling. I gave him all my samples of new Nuzyra. This seems to have helped although the wound is still open it. His abrasion closed by here Left dorsal great toe really no better. Still [name] very nonviable surface Right dorsal foot perhaps some better. We have been using bacitracin and silver nitrate to the areas on his lower legs and Hydrofera Blue to the area on the buttock. 8/16 Disappointed that his left buttock wound is actually more substantial. Apparently during the last nurse visit these were both very small. He has continued irritation to [name] large area of skin on his buttock. I have never been able to totally explain this although I think it some combination of the way he sits, pressure, moisture.  He is not incontinent enough to contribute to this. Left dorsal great toe still fibrinous debris on the surface that I have debrided today Large area across the dorsal right toes. The area on the left anterior mid tibia has less swelling. He completed the [place]. This does not look infected although the tissue is still fried 8/30; 2-week follow-up. Left buttock areas not improved. We used Hydrofera Blue on this. Weeping wet with the surrounding erythema that I have not been able to control even with Lotrisone and topical Coloplast Left dorsal great toe looks about the same More substantial area again at the base of his toes on the left which is new this week. Area across the dorsal right toes looks improved The left anterior mid tibia looks like it is trying to close 9/13; 2-week follow-up. Using silver alginate on all of his wounds. The left dorsal foot does not look any better. He has the area on the dorsal toe and also the areas at the base of all of his toes 1 through  3. On the right foot he has [name] similar pattern in [name] similar area. He has the area on his left mid tibia that looks fairly healthy. Finally the area on his left buttock looks somewhat bette 9/20; culture I did of the left foot which was [name] deep tissue culture last time showed E. coli he has erythema around this wound. Still [name] completely necrotic surface. His right dorsal foot looks about the same. He has [name] very friable surface to the left anterior mid tibia. Both buttock wounds look better. We have been using silver alginate to all wounds 10/4; he has completed the cephalexin that I gave him last time for the left foot. He is using topical gentamicin under silver alginate silver alginate being applied to all the wounds. Unfortunately all the wounds look irritated on his dorsal right foot dorsal left foot left mid tibia. I wonder if this could be [name] silver allergy. I am going to change him to [facility] on the lower  extremity. The skin on the left buttock and left posterior thigh still flaking dry and irritated. This has continued no matter what I have applied topically to this. He has [name] solitary open wound which by itself does not look too bad however the entire area of surrounding skin does not change no matter what we have applied here 10/18; the area on the left dorsal foot and right dorsal foot both look better. The area on the right extends into the plantar but not between his toes. We have been using silver alginate. He still has [name] rectangular erythematous area around the area on the left tibia. The wound itself is very small. Finally everything on his left buttock looks [name] little larger the skin is erythematous 11/15; patient comes in with the left dorsal and right dorsal foot distally looking somewhat better. Still nonviable surface on the left foot which required debridement. He still has the area on the left anterior mid tibia although this looks somewhat better. He has [name] new area on the right lateral lower leg just above the ankle. Finally his left buttock looks terrible with multiple superficial open wounds geometric square shaped area of chronic erythema which I have not been able to sort through 11/29; right dorsal foot and left dorsal foot both look somewhat better. No debridement required. He has the fragile area on the left anterior mid tibia this looks and continues to look somewhat better. Right lateral lower leg just above the ankle we identified last time also looks better. In general the area on his left buttock looks improved. We are using Hydrofera Blue to all wound areas 12/13; right dorsal foot looks better. The area on the right lateral leg is healed. Left dorsal foot has 2 open areas both of which require debridement. The fragile area on the left anterior mid tibial looks better. Smaller area on his buttocks. Were using Hydrofera Blue 1/10; patient comes in with everything looking  slightly larger and/or worse. This includes his left buttock, reopening of the left mid tibia, larger areas on the left dorsal foot and what looks to be [name] cellulitis on the right dorsal and plantar foot. We have been using Hydrofera Blue on all wounds. 1/17; right dorsal foot distally looks better today. The left foot has 2 open wounds that are about the same surrounding erythema. 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 No  malignancy they did not stain for fungus 1/24; his right distal foot is not closed dry and scaly but the wound looks like it is contracting. I did not debride anything here. Problem on his left dorsal foot with expanding erythema. Apparently there were problems last week getting the Elesa Hacker however it is now available at the [facility] but [name] week later. He is using ketoconazole and Coloplast to the left buttock along with Hydrofera Blue this actually looks quite [name] bit better today. 1/31; right dorsal foot again is dry and scaly but looking to contract. He has been using [name] moisturizer on his feet at my request but he is not sure which 1. The left dorsal foot wounds look about the same there is erythema here that I marked last week however after course of Nuzyra it certainly is not any better but not any worse either. Finally on his left buttock the skin continues to look better he has the original wound but [name] new substantial area towards the gluteal cleft. Almost like [name] skin tear. I used scissors to remove skin and subcutaneous tissue here silver nitrate and direct pressure 2/7; right dorsal foot. This does not look too much different from last week. Some erythema skin dry and scaly. No debridement. Left dorsal great toe again still not much improvement. I did remove flaking dry skin and callus from around the edge. Finally on his left  buttock. The skin is somewhat better in the periwound. Surface wounds are superficial somewhat better than last week. 01/26/2022: Is [name] little bit of [name] mystery as to why his wounds fail to respond to treatments and actually seem to get worse. This is my first encounter with this patient. He was previously followed by Dr. Dellia Nims. Based upon my review of the chart, it seems that there is [name] little bit of [name] mystery as to why his wounds do not respond as anticipated to the interventions applied and sometimes even get worse. Biopsies have been performed and he was seen by dermatology in [place], but that did not shed any light on the matter. T oday, his gluteal wound is larger, with substantial drainage, rather malodorous. The food wounds are not terrible, but he has [name] lot of callus and scaly skin around these. He is currently getting silver alginate on the gluteal wound, with idodoflex to the feet. He is using lotrisone on his legs for the dry, scaly skin. 02/09/2022: There has really been no change to any of his wounds. The gluteal wound less drainage and odor, but remains about the same size, the periwound skin remains oddly scaly. His lower extremity wounds also appear roughly the same size. They continue to accumulate [name] small amount of slough. The periwound on his feet and ankle wounds has dry eschar and loose dead skin. We have been using silver alginate on the gluteal wound and Iodoflex on his feet and ankle wounds. T the periwound around his gluteal lesion and Lotrisone on his feet and legs. o 02/23/2022: The right plantar foot wound is closed. The gluteal site looks small but has continued to produce hypertrophic granulation tissue. The foot wounds all look about the same on the dorsal surface of the right foot; on the left, there is only [name] small open area at the site of where his left great toenail would have been. 03/16/2022: The right ankle wound is healed. The right dorsal foot wound is about the  same. The left dorsal foot wound is quite [name]  bit smaller and the ischial wound is nearly closed. 03/30/2022: The right ankle wound reopened. Both dorsal foot wounds are quite [name] bit smaller. Unfortunately, he appears to have sheared part of his ischial wound open further, perhaps during [name] transfer. 04/13/2022: The right ankle wound has hypertrophic granulation tissue present. The dorsal foot wounds continue to decrease in size. The ischial wound looks about the same today, no better, no worse. 04/27/2022: The right ankle wound has closed. Unfortunately, it looks like some moisture got underneath the dry skin on both of his dorsal feet and these wounds have expanded in size. The ischial wound remains the same with perhaps [name] little bit more slough accumulation than at our previous visit. 05/11/2022: The right ankle wound remains closed. There is [name] left anterior tibial wound that is small has patchy openings with accumulated slough. The dorsum of his right foot appears to be nearly healed with just [name] small punctate opening. The plantar surface of his right foot has [name] new opening that looks like he may have picked some skin there. His sacral ulcer has hypertrophic granulation tissue but has some slough accumulation. The dorsum of his left foot has multiple open areas in [name] fairly ragged distribution. All of these have slough accumulated within them. Electronic Signature(s) Signed: 05/11/2022 9:39:45 AM By: Fredirick Maudlin MD FACS Entered By: Fredirick Maudlin on 05/11/2022 09:39:45 -------------------------------------------------------------------------------- Chemical Cauterization Details Patient Name: Date of Service: [name], [name] 05/11/2022 8:15 [name] Medical Record Number: [id number] Patient Account Number: [id number] Date of Birth/Sex: Treating RN: [birth year] (36 y.o. [name]: [place], [place] Other Clinician: Referring Provider: Treating Provider/Extender: Cornelia Copa [name]: [phone] Procedure Performed for: Wound #41R Left Ischium Performed By: Physician Fredirick Maudlin, MD Post Procedure Diagnosis Same as Pre-procedure Notes using silver nitrate stick Electronic Signature(s) Signed: 05/11/2022 11:12:49 AM By: Fredirick Maudlin MD FACS Signed: 05/12/2022 6:21:16 PM By: Baruch Gouty RN, BSN Entered By: Baruch Gouty on 05/11/2022 09:22:18 -------------------------------------------------------------------------------- Physical Exam Details Patient Name: Date of Service: [name], [name] 05/11/2022 8:15 [name] Medical Record Number: [id number] Patient Account Number: [id number] Date of Birth/Sex: Treating RN: 11/14/1986 (36 y.o. [name]: [place], [place] Other Clinician: Referring Provider: Treating Provider/Extender: Graciela Husbands, [name]: 331 Constitutional .Tachycardic, asymptomatic.. . . No acute distress.Marland Kitchen Respiratory Normal work of breathing on room air.. Notes 05/11/2022: The right ankle wound remains closed. There is [name] left anterior tibial wound that is small has patchy openings with accumulated slough. The dorsum of his right foot appears to be nearly healed with just [name] small punctate opening. The plantar surface of his right foot has [name] new opening that looks like he may have picked some skin there. His sacral ulcer has hypertrophic granulation tissue but has some slough accumulation. The dorsum of his left foot has multiple open areas in [name] fairly ragged distribution. All of these have slough accumulated within them. Electronic Signature(s) Signed: 05/11/2022 9:41:06 AM By: Fredirick Maudlin MD FACS Entered By: Fredirick Maudlin on 05/11/2022 09:41:06 -------------------------------------------------------------------------------- Physician Orders Details Patient Name: Date of Service: [name], [name] 05/11/2022 8:15 [name] Medical Record Number:  [id number] Patient Account Number: [id number] Date of Birth/Sex: Treating RN: 04/29/1986 (36 y.o. 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 - Dr Celine Ahr - Room 1 - Tuesday 7/11 @ 8:15 am Bathing/ Shower/ Hygiene May shower and wash wound with soap and water. - on days that dressing is changed Edema Control - Lymphedema / SCD / Other Elevate legs to the level of the heart or above for 30 minutes daily and/or when sitting, [name] frequency of: - throughout the day Moisturize legs daily. 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 Peri-Wound Care: Zinc Oxide Ointment 30g tube Every Other Day/30 Days Discharge Instructions: Apply Zinc Oxide to periwound with each dressing change Prim  Dressing: KerraCel Ag Gelling Fiber Dressing, 4x5 in (silver alginate) (Generic) Every Other Day/30 Days ary Discharge Instructions: Apply silver alginate to wound bed as instructed Secondary Dressing: ABD Pad, 8x10 (Generic) Every Other Day/30 Days Discharge Instructions: Apply over primary dressing as directed. Secondary Dressing: Woven Gauze Sponge, Non-Sterile 4x4 in Every Other Day/30 Days Discharge Instructions: Apply over primary dressing as directed. Secured With: 62M Medipore H Soft Cloth Surgical T ape, 4 x 10 (in/yd) Every Other Day/30 Days Discharge Instructions: Secure with tape as directed. Wound #52 - Foot Wound Laterality: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 developed what appeared to be an intense cellulitis around the left lateral leg wound. I gave  him Dalvance I think on 2/12 perhaps 2/13 he continues on cefdinir. The erythema is still present but the warmth and swelling is improved. I am hopeful that the cellulitis part of this control. I wouldn't be surprised if there is an element of venous inflammation as well. 01/17/17. The erythema is present but better in the left leg. His left lateral leg wound still does not have [name] viable surface buttons certain parts of this long thin wound it appears like there has been improvement in dimensions. 01/20/17; the erythema still present but much better in the left leg. I'm thinking this is his usual degree of  chronic venous inflammation. The wound on the left leg looks somewhat better. Is less surface slough 01/27/17; erythema is back to the chronic venous inflammation. The wound on the left leg is somewhat better. I am back to the point where I like to try an Apligraf once again 02/10/17; slight improvement in wound dimensions. Apligraf #2. He is completing his doxycycline 02/14/17; patient arrives today having completed doxycycline last Thursday. This was supposed to be [name] nurse visit however once again he hasn't tense erythema from the medial part of his wound extending over the lower leg. Also erythema in his foot this is roughly in the same distribution as last time. He has baseline chronic venous inflammation however this is [name] lot worse than the baseline I have learned to accept the on him is baseline inflammation 02/24/17- patient is here for follow-up evaluation. He is tolerating compression therapy. His voicing no complaints or concerns he is here anticipating an Apligraf 03/03/17; he arrives today with an adherent necrotic surface. I don't think this is surface is going to be amenable for Apligraf's. The erythema around his wound and on the left dorsal foot has resolved he is off antibiotics 03/10/17; better-looking surface today. I don't think he can tolerate Apligraf's. He tells me he had [name] wound VAC after [name] skin graft years ago to this area and they had difficulty with [name] seal. The erythema continues to be stable around this some degree of chronic venous inflammation but he also has recurrent cellulitis. We have been using Iodoflex 03/17/17; continued improvement in the surface and may be small changes in dimensions. Using Iodoflex which seems the only thing that will control his surface 03/24/17- He is here for follow up evaluation of his LLE lateral ulceration and ulcer to right dorsal foot/toe space. He is voicing no complaints or concerns, He is tolerating compression wrap. 03/31/17 arrives today  with [name] much healthier looking wound on the left lower extremity. We have been using Iodoflex for [name] prolonged period of time which has for the first time prepared and adequate looking wound bed although we have not had much in the way of wound dimension improvement. He also has [name] small wound between the first and second toe on the right 04/07/17; arrives today with [name] healthy-looking wound bed and at least the top 50% of this wound appears to be now her. No debridement was required I have changed him to [facility] last week after prolonged Iodoflex. He did not do well with Apligraf's. We've had [name] re-opening between the first and second toe on the right 04/14/17; arrives today with [name] healthier looking wound bed contractions and the top 50% of this wound and some on the lesser 50%. Wound bed appears healthy. The area between the first and second toe on the right still remains problematic 04/21/17; continued very gradual improvement. Using [facility]  Blue 04/28/17; continued very gradual improvement in the left lateral leg venous insufficiency wound. His periwound erythema is very mild. We have been using Hydrofera Blue. Wound is making progress especially in the superior 50% 05/05/17; he continues to have very gradual improvement in the left lateral venous insufficiency wound. Both in terms with an length rings are improving. I debrided this every 2 weeks with #5 curet and we have been using Hydrofera Blue and again making good progress With regards to the wounds between his right first and second toe which I thought might of been tinea pedis he is not making as much progress very dry scaly skin over the area. Also the area at the base of the left first and second toe in [name] similar condition 05/12/17; continued gradual improvement in the refractory left lateral venous insufficiency wound on the left. Dimension smaller. Surface still requiring debridement using Hydrofera Blue 05/19/17; continued gradual  improvement in the refractory left lateral venous ulceration. Careful inspection of the wound bed underlying rumination suggested some degree of epithelialization over the surface no debridement indicated. Continue Hydrofera Blue difficult areas between his toes first and third on the left than first and second on the right. I'm going to change to silver alginate from silver collagen. Continue ketoconazole as I suspect underlying tinea pedis 05/26/17; left lateral leg venous insufficiency wound. We've been using Hydrofera Blue. I believe that there is expanding epithelialization over the surface of the wound albeit not coming from the wound circumference. This is [name] bit of an odd situation in which the epithelialization seems to be coming from the surface of the wound rather than in the exact circumference. There is still small open areas mostly along the lateral margin of the wound. ooHe has unchanged areas between the left first and second and the right first second toes which I been treating for tenia pedis 06/02/17; left lateral leg venous insufficiency wound. We have been using Hydrofera Blue. Somewhat smaller from the wound circumference. The surface of the wound remains [name] bit on it almost epithelialized sedation in appearance. I use an open curette today debridement in the surface of all of this especially the edges ooSmall open wounds remaining on the dorsal right first and second toe interspace and the plantar left first second toe and her face on the left 06/09/17; wound on the left lateral leg continues to be smaller but very gradual and very dry surface using Hydrofera Blue 06/16/17 requires weekly debridements now on the left lateral leg although this continues to contract. I changed to silver collagen last week because of dryness of the wound bed. Using Iodoflex to the areas on his first and second toes/web space bilaterally 06/24/17; patient with history of paraplegia also chronic venous  insufficiency with lymphedema. Has [name] very difficult wound on the left lateral leg. This has been gradually reducing in terms of with but comes in with [name] very dry adherent surface. High switch to silver collagen [name] week or so ago with hydrogel to keep the area moist. This is been refractory to multiple dressing attempts. He also has areas in his first and second toes bilaterally in the anterior and posterior web space. I had been using Iodoflex here after [name] prolonged course of silver alginate with ketoconazole was ineffective [question tinea pedis] 07/14/17; patient arrives today with [name] very difficult adherent material over his left lateral lower leg wound. He also has surrounding erythema and poorly controlled edema. He was switched his Santyl last visit which  the nurses are applying once during his doctor visit and once on [name] nurse visit. He was also reduced to 2 layer compression I'm not exactly sure of the issue here. 07/21/17; better surface today after 1 week of Iodoflex. Significant cellulitis that we treated last week also better. [Doxycycline] 07/28/17 better surface today with now 2 weeks of Iodoflex. Significant cellulitis treated with doxycycline. He has now completed the doxycycline and he is back to his usual degree of chronic venous inflammation/stasis dermatitis. He reminds me he has had ablations surgery here 08/04/17; continued improvement with Iodoflex to the left lateral leg wound in terms of the surface of the wound although the dimensions are better. 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 Problematic areas on the plantar aspect of the first second toe web space on the left and the dorsal aspect of the first second toe web space on the right. At one point I felt these were probably related to chronic fungal infections in treated him aggressively for this although we have not made any improvement here. 08/11/17; left  lateral leg. Surface continues to improve with the Iodoflex although we are not seeing much improvement in overall wound dimensions. Areas on his plantar left foot and right foot show no improvement. In fact the right foot looks somewhat worse 08/18/17; left lateral leg. We changed to [facility] Blue last week after [name] prolonged course of Iodoflex which helps get the surface better. It appears that the wound with is improved. Continue with difficult areas on the left dorsal first second and plantar first second on the right 09/01/17; patient arrives in clinic today having had [name] temperature of 103 yesterday. He was seen in the ER and [facility]. The patient was concerned he could have cellulitis again in the right leg however they diagnosed him with [name] UTI and he is now on Keflex. He has [name] history of cellulitis which is been recurrent and difficult but this is been in the left leg, in the past 5 use doxycycline. He does in and out catheterizations at home which are risk factors for UTI 09/08/17; patient will be completing his Keflex this weekend. The erythema on the left leg is considerably better. He has [name] new wound today on the medial part of the right leg small superficial almost looks like [name] skin tear. He has worsening of the area on the right dorsal first and second toe. His major area on the left lateral leg is better. Using Hydrofera Blue on all areas 09/15/17; gradual reduction in width on the long wound in the left lateral leg. No debridement required. He also has wounds on the plantar aspect of his left first second toe web space and on the dorsal aspect of the right first second toe web space. 09/22/17; there continues to be very gradual improvements in the dimensions of the left lateral leg wound. He hasn't round erythematous spot with might be pressure on his wheelchair. There is no evidence obviously of infection no purulence no warmth ooHe has [name] dry scaled area on the plantar aspect of the left  first second toe ooImproved area on the dorsal right first second toe. 09/29/17; left lateral leg wound continues to improve in dimensions mostly with an is still [name] fairly long but increasingly narrow wound. ooHe has [name] dry scaled area on the plantar aspect of his left first second toe web space ooIncreasingly concerning area on the dorsal right first second  toe. In fact I am concerned today about possible cellulitis around this wound. The areas extending up his second toe and although there is deformities here almost appears to abut on the nailbed. 10/06/17; left lateral leg wound continues to make very gradual progress. Tissue culture I did from the right first second toe dorsal foot last time grew MRSA and enterococcus which was vancomycin sensitive. This was not sensitive to clindamycin or doxycycline. He is allergic to Zyvox and sulfa we have therefore arrange for him to have dalvance infusion tomorrow. He is had this in the past and tolerated it well 10/20/17; left lateral leg wound continues to make decent progress. This is certainly reduced in terms of with there is advancing epithelialization.ooThe cellulitis in the right foot looks better although he still has [name] deep wound in the dorsal aspect of the first second toe web space. Plantar left first toe web space on the left I think is making some progress 10/27/17; left lateral leg wound continues to make decent progress. Advancing epithelialization.using Hydrofera Blue ooThe right first second toe web space wound is better-looking using silver alginate ooImprovement in the left plantar first second toe web space. Again using silver alginate 11/03/17 left lateral leg wound continues to make decent progress albeit slowly. Using Hydrofera Blue ooThe right per second toe web space continues to be [name] very problematic looking punched out wound. I obtained [name] piece of tissue for deep culture I did extensively treated this for fungus. It is  difficult to imagine that this is [name] pressure area as the patient states other than going outside he doesn't really wear shoes at home ooThe left plantar first second toe web space looked fairly senescent. Necrotic edges. This required debridement oochange to Hydrofera Blue to all wound areas 11/10/17; left lateral leg wound continues to contract. Using Hydrofera Blue ooOn the right dorsal first second toe web space dorsally. Culture I did of this area last week grew MRSA there is not an easy oral option in this patient was multiple antibiotic allergies or intolerances. This was only [name] rare culture isolate I'm therefore going to use Bactroban under silver alginate ooOn the left plantar first second toe web space. Debridement is required here. This is also unchanged 11/17/17; left lateral leg wound continues to contract using Hydrofera Blue this is no longer the major issue. ooThe major concern here is the right first second toe web space. He now has an open area going from dorsally to the plantar aspect. There is now wound on the inner lateral part of the first toe. Not [name] very viable surface on this. There is erythema spreading medially into the forefoot. ooNo major change in the left first second toe plantar wound 11/24/17; left lateral leg wound continues to contract using Hydrofera Blue. Nice improvement today ooThe right first second toe web space all of this looks [name] lot less angry than last week. I have given him clindamycin and topical Bactroban for MRSA and terbinafine for the possibility of underlining tinea pedis that I could not control with ketoconazole. Looks somewhat better ooThe area on the plantar left first second toe web space is weeping with dried debris around the wound 12/01/17; left lateral leg wound continues to contract he Hydrofera Blue. It is becoming thinner in terms of with nevertheless it is making good improvement. ooThe right first second toe web space looks less  angry but still [name] large necrotic-looking wounds starting on the plantar aspect of the right foot extending between  the toes and now extensively on the base of the right second toe. I gave him clindamycin and topical Bactroban for MRSA anterior benefiting for the possibility of underlying tinea pedis. Not looking better today ooThe area on the left first/second toe looks better. Debrided of necrotic debris 12/05/17* the patient was worked in urgently today because over the weekend he found blood on his incontinence bad when he woke up. He was found to have an ulcer by his wife who does most of his wound care. He came in today for [place] to look at this. He has not had [name] history of wounds in his buttocks in spite of his paraplegia. 12/08/17; seen in follow-up today at his usual appointment. He was seen earlier this week and found to have [name] new wound on his buttock. We also follow him for wounds on the left lateral leg, left first second toe web space and right first second toe web space 12/15/17; we have been using Hydrofera Blue to the left lateral leg which has improved. The right first second toe web space has also improved. Left first second toe web space plantar aspect looks stable. The left buttock has worsened using Santyl. Apparently the buttock has drainage 12/22/17; we have been using Hydrofera Blue to the left lateral leg which continues to improve now 2 small wounds separated by normal skin. He tells [place] he had [name] fever up to 100 yesterday he is prone to UTIs but has not noted anything different. He does in and out catheterizations. The area between the first and second toes today does not look good necrotic surface covered with what looks to be purulent drainage and erythema extending into the third toe. I had gotten this to something that I thought look better last time however it is not look good today. He also has [name] necrotic surface over the buttock wound which is expanded. I thought there might  be infection under here so I removed [name] lot of the surface with [name] #5 curet though nothing look like it really needed culturing. He is been using Santyl to this area 12/27/17; his original wound on the left lateral leg continues to improve using Hydrofera Blue. I gave him samples of Baxdella although he was unable to take them out of fear for an allergic reaction ["lump in his throat"].the culture I did of the purulent drainage from his second toe last week showed both enterococcus and [name] set Enterobacter I was also concerned about the erythema on the bottom of his foot although paradoxically although this looks somewhat better today. 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 We've been using silver alginate here. They came up today that he sleeps in [name] recliner, I'm not sure why but I asked him to stop this 01/03/18; his original wound we've been using Hydrofera Blue is now separated into 2 areas. ooUlcer on his left buttock is better he is off the recliner and sleeping in bed ooFinally both wound areas between his first and second toes also looks some better 01/10/18; his original wound on the left lateral leg is now separated into 2 wounds we've been using Hydrofera Blue ooUlcer on his left buttock has some drainage. There is [name] small probing site going into muscle layer superiorly.using silver alginate -He arrives today with [name] deep tissue injury on the left heel ooThe wound on the dorsal aspect of his first second toe  on the left looks [name] lot betterusing silver alginate ketoconazole ooThe area on the first second toe web space on the right also looks [name] lot bette 01/17/18; his original wound on the left lateral leg continues to progress using Hydrofera Blue ooUlcer on his left buttock also is smaller surface healthier except for [name] small probing site going into the muscle layer superiorly. 2.4 cm of tunneling  in this area ooDTI on his left heel we have only been offloading. Looks better than last week no threatened open no evidence of infection oothe wound on the dorsal aspect of the first second toe on the left continues to look like it's regressing we have only been using silver alginate and terbinafine orally ooThe area in the first second toe web space on the right also looks to be [name] lot better using silver alginate and terbinafine I think this was prompted by tinea pedis 01/31/18; the patient was hospitalized in [place] last week apparently for [name] complicated UTI. He was discharged on cefepime he does in and out catheterizations. In the hospital he was discovered M I don't mild elevation of AST and ALT and the terbinafine was stopped.predictably the pressure ulcer on s his buttock looks betterusing silver alginate. The area on the left lateral leg also is better using Hydrofera Blue. The area between the first and second toes on the left better. First and second toes on the right still substantial but better. Finally the DTI on the left heel has held together and looks like it's resolving 02/07/18-he is here in follow-up evaluation for multiple ulcerations. He has new injury to the lateral aspect of the last issue [name] pressure ulcer, he states this is from adhesive removal trauma. He states he has tried multiple adhesive products with no success. All other ulcers appear stable. The left heel DTI is resolving. We will continue with same treatment plan and follow-up next week. 02/14/18; follow-up for multiple areas. ooHe has [name] new area last week on the lateral aspect of his pressure ulcer more over the posterior trochanter. The original pressure ulcer looks quite stable has healthy granulation. 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 Almost fully epithelialized on the original superior  area using Hydrofera Blue ooDTI on the left heel has peeled off this week to reveal [name] small superficial wound under denuded skin and subcutaneous tissue ooBoth areas between the first and second toes look better including nothing open on the left 02/21/18; ooThe patient's wounds on his left ischial tuberosity and posterior left greater trochanter actually looked better. He has [name] large area of irritation around the area which I think is contact dermatitis. I am doubtful that this is fungal ooHis original wound on the left lateral calf continues to improve we have been using Hydrofera Blue ooThere is no open area in the left first second toe web space although there is [name] lot of thick callus ooThe DTI on the left heel required debridement today of necrotic surface eschar and subcutaneous tissue using silver alginate ooFinally the area on the right first second toe webspace continues to contract using silver alginate and ketoconazole 02/28/18 ooLeft ischial tuberosity wounds look better using silver alginate. ooOriginal wound on the left calf only has one small open area left using Hydrofera Blue ooDTI on the left heel required debridement mostly removing skin from around this wound surface. Using silver alginate ooThe areas on the right  first/second toe web space using silver alginate and ketoconazole 03/08/18 on evaluation today patient appears to be doing decently well as best I can tell in regard to his wounds. This is the first time that I have seen him as he generally is followed by Dr. Dellia Nims. With that being said none of his wounds appear to be infected he does have an area where there is some skin covering what appears to be [name] new wound on the left dorsal surface of his great toe. This is right at the nail bed. With that being said I do believe that debrided away some of the excess skin can be of benefit in this regard. Otherwise he has been tolerating the dressing changes without  complication. 03/14/18; patient arrives today with the multiplicity of wounds that we are following. He has not been systemically unwell ooOriginal wound on the left lateral calf now only has 2 small open areas we've been using Hydrofera Blue which should continue ooThe deep tissue injury on the left heel requires debridement today. We've been using silver alginate ooThe left first second toe and the right first second toe are both are reminiscence what I think was tinea pedis. Apparently some of the callus Surface between the toes was removed last week when it started draining. ooPurulent drainage coming from the wound on the ischial tuberosity on the left. 03/21/18-He is here in follow-up evaluation for multiple wounds. 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 He tolerated debridement. The only change to last week's recommendations is to discontinue antifungal cream between toes. He will follow-up next week 03/28/18; following up for multiple wounds;Concern this week is streaking redness and swelling in the right foot. He is going to need antibiotics for this. 03/31/18; follow-up for right foot cellulitis. Streaking redness and swelling in the right foot on 03/28/18. 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 I put him on clindamycin 300 mg every 6 and brought him in for [name] quick check. He has an open wound between his first and second toes on the right foot as [name] potential source. 04/04/18; ooRight foot cellulitis is resolving he is completing clindamycin. This is truly good news ooLeft lateral calf wound which is initial wound only has one small open area inferiorly this is close to healing out. He has compression stockings. We will use Hydrofera Blue right down to the epithelialization of this ooNonviable surface on the left heel which was initially pressure with [name] DTI. We've been using Hydrofera  Blue. I'm going to switch this back to silver alginate ooLeft first second toe/tinea pedis this looks better using silver alginate ooRight first second toe tinea pedis using silver alginate ooLarge pressure ulcers on theLeft ischial tuberosity. Small wound here Looks better. I am uncertain about the surface over the large wound. Using silver alginate 04/11/18; ooCellulitis in the right foot is resolved ooLeft lateral calf wound which was his original wounds still has 2 tiny open areas remaining this is just about closed ooNonviable surface on the left heel is better but still requires debridement ooLeft first second toe/tinea pedis still open using silver alginate ooRight first second toe wound tinea pedis I asked him to go back to using ketoconazole and silver alginate ooLarge pressure ulcers on the left ischial tuberosity this shear injury here is resolved. Wound is smaller. No evidence of infection using silver alginate 04/18/18; ooPatient arrives with an intense area of cellulitis in the right  mid lower calf extending into the right heel area. Bright red and warm. Smaller area on the left anterior leg. He has [name] significant history of MRSA. He will definitely need antibioticsoodoxycycline ooHe now has 2 open areas on the left ischial tuberosity the original large wound and now [name] satellite area which I think was above his initial satellite areas. Not [name] wonderful surface on this satellite area surrounding erythema which looks like pressure related. ooHis left lateral calf wound again his original wound is just about closed ooLeft heel pressure injury still requiring debridement ooLeft first second toe looks [name] lot better using silver alginate ooRight first second toe also using silver alginate and ketoconazole cream also looks better 04/20/18; the patient was worked in early today out of concerns with his cellulitis on the right leg. I had started him on doxycycline. This was 2 days  ago. His wife was concerned about the swelling in the area. Also concerned about the left buttock. He has not been systemically unwell no fever chills. No nausea vomiting or diarrhea 04/25/18; the patient's left buttock wound is continued to deteriorate he is using Hydrofera Blue. He is still completing clindamycin for the cellulitis on the right leg although all of this looks better. 05/02/18 ooLeft buttock wound still with [name] lot of drainage and [name] very tightly adherent fibrinous necrotic surface. He has [name] deeper area superiorly ooThe left lateral calf wound is still closed ooDTI wound on the left heel necrotic surface especially the circumference using Iodoflex ooAreas between his left first second toe and right first second toe both look better. Dorsally and the right first second toe he had [name] necrotic surface although at smaller. In using silver alginate and ketoconazole. I did [name] culture last week which was [name] deep tissue culture of the reminiscence of the open wound on the right first second toe dorsally. This grew [name] few Acinetobacter and [name] few methicillin-resistant staph aureus. Nevertheless the area actually this week looked better. I didn't feel the need to specifically address this at least in terms of systemic antibiotics. 05/09/18; wounds are measuring larger more drainage per our intake. We are using Santyl covered with alginate on the large superficial buttock wounds, Iodosorb on the left heel, ketoconazole and silver alginate to the dorsal first and second toes bilaterally. 05/16/18; ooThe area on his left buttock better in some aspects although the area superiorly over the ischial tuberosity required an extensive debridement.using Santyl ooLeft heel appears stable. Using Iodoflex ooThe areas between his first and second toes are not bad however there is spreading erythema up the dorsal aspect of his left foot this looks like cellulitis again. He is insensate the erythema is really  very brilliant.o Erysipelas He went to see an allergist days ago because he was itching part of this he had lab work done. This showed [name] white count of 15.1 with 70% neutrophils. Hemoglobin of 11.4 and [name] platelet count of 659,000. Last white count we had in Epic was [name] 2-1/2 years ago which was 25.9 but he was ill at the time. He was able to show me some lab work that was done by his primary physician the pattern is about the same. 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 But prompted me to go ahead and do x-rays of both feet and the pelvis rule out osteomyelitis. He also had [name] comprehensive metabolic panel this was reasonably normal his albumin was 3.7 liver function  tests BUN/creatinine all normal 05/23/18; x-rays of both his feet from last week were negative for underlying pulmonary abnormality. The x-ray of his pelvis however showed mild irregularity in the left ischial which may represent some early osteomyelitis. The wound in the left ischial continues to get deeper clearly now exposed muscle. Each week necrotic surface material over this area. Whereas the rest of the wounds do not look so bad. ooThe left ischial wound we have been using Santyl and calcium alginate ooT the left heel surface necrotic debris using Iodoflex o ooThe left lateral leg is still healed ooAreas on the left dorsal foot and the right dorsal foot are about the same. There is some inflammation on the left which might represent contact dermatitis, fungal dermatitis I am doubtful cellulitis although this looks better than last week 05/30/18; CT scan done at Hospital did not show any osteomyelitis or abscess. Suggested the possibility of underlying cellulitis although I don't see [name] lot of evidence of this at the bedside ooThe wound itself on the left buttock/upper thigh actually looks somewhat better. No debridement ooLeft heel also looks better no debridement continue  Iodoflex ooBoth dorsal first second toe spaces appear better using Lotrisone. Left still required debridement 06/06/18; ooIntake reported some purulent looking drainage from the left gluteal wound. Using Santyl and calcium alginate ooLeft heel looks better although still [name] nonviable surface requiring debridement ooThe left dorsal foot first/second webspace actually expanding and somewhat deeper. I may consider doing [name] shave biopsy of this area ooRight dorsal foot first/second webspace appears stable to improved. Using Lotrisone and silver alginate to both these areas 06/13/18 ooLeft gluteal surface looks better. Now separated in the 2 wounds. No debridement required. Still drainage. We'll continue silver alginate ooLeft heel continues to look better with Iodoflex continue this for at least another week ooOf his dorsal foot wounds the area on the left still has some depth although it looks better than last week. We've been using Lotrisone and silver alginate 06/20/18 ooLeft gluteal continues to look better healthy tissue ooLeft heel continues to look better healthy granulation wound is smaller. He is using Iodoflex and his long as this continues continue the Iodoflex ooDorsal right foot looks better unfortunately dorsal left foot does not. There is swelling and erythema of his forefoot. He had minor trauma to this several days ago but doesn't think this was enough to have caused any tissue injury. Foot looks like cellulitis, we have had this problem before 06/27/18 on evaluation today patient appears to be doing [name] little worse in regard to his foot ulcer. 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 Both of which would really be his only options for treating this infection. In the past he has been given and effusion of Orbactiv. This is done very well for  him in the past again it's one time dosing IV antibiotic therapy. Subsequently I do believe this is something we're gonna need to see about doing at this point in time. Currently his other wounds seem to be doing somewhat better in my pinion I'm pretty happy in that regard. 07/03/18 on evaluation today patient's wounds actually appear to be doing fairly well. He has been tolerating the dressing changes without complication. All in all he seems to be showing signs of improvement. In regard to the antibiotics he has been dealing with infectious disease since  I saw him last week as far as getting this scheduled. In the end [facility]'s [facility] this coming Friday. In the meantime he has been continuing to perform the dressing changes in such as previous. There does not appear to be any evidence of infection worsengin at this time. 07/10/18; ooSince I last saw this man 2 weeks ago things have actually improved. IV antibiotics of resulted in less forefoot erythema although there is still some present. He is not systemically unwell ooLeft buttock wounds o2 now have no depth there is increased epithelialization Using silver alginate ooLeft heel still requires debridement using Iodoflex ooLeft dorsal foot still with [name] sizable wound about the size of [name] border but healthy granulation ooRight dorsal foot still with [name] slitlike area using silver alginate 07/18/18; the patient's cellulitis in the left foot is improved in fact I think it is on its way to resolving. ooLeft buttock wounds o2 both look better although the larger one has hypertension granulation we've been using silver alginate ooLeft heel has some thick circumferential redundant skin over the wound edge which will need to be removed today we've been using Iodoflex ooLeft dorsal foot is still [name] sizable wound required debridement using silver alginate ooThe right dorsal foot is just about  closed only [name] small open area remains here 07/25/18; left foot cellulitis is resolved ooLeft buttock wounds o2 both look better. Hyper-granulation on the major area ooLeft heel as some debris over the surface but otherwise looks [name] healthier wound. Using silver collagen ooRight dorsal foot is just about closed 07/31/18; arrives with our intake nurse worried about purulent drainage from the buttock. We had hyper-granulation here last week ooHis buttock wounds o2 continue to look better ooLeft heel some debris over the surface but measuring smaller. ooRight dorsal foot unfortunately has openings between the toes ooLeft foot superficial wound looks less aggravated. 08/07/18 ooButtock wounds continue to look better although some of her granulation and the larger medial wound. silver alginate ooLeft heel continues to look [name] lot better.silver collagen ooLeft foot superficial wound looks less stable. Requires debridement. He has [name] new wound superficial area on the foot on the lateral dorsal foot. ooRight foot looks better using silver alginate without Lotrisone 08/14/2018; patient was in the ER last week diagnosed with [name] UTI. He is now on Cefpodoxime and Macrodantin. ooButtock wounds continued to be smaller. Using silver alginate ooLeft heel continues to look better using silver collagen ooLeft foot superficial wound looks as though it is improving ooRight dorsal foot area is just about healed. 08/21/2018; patient is completed his antibiotics for his UTI. ooHe has 2 open areas on the buttocks. There is still not closed although the surface looks satisfactory. Using silver alginate ooLeft heel continues to improve using silver collagen ooThe bilateral dorsal foot areas which are at the base of his first and second toes/possible tinea pedis are actually stable on the left but worse on the right. The area on the left required debridement of necrotic surface. After debridement I obtained [name]  specimen for PCR culture. ooThe right dorsal foot which is been just about healed last week is now reopened 08/28/2018; culture done on the left dorsal foot showed coag negative staph both staph epidermidis and Lugdunensis. I think this is worthwhile initiating systemic treatment. I will use doxycycline given his long list of allergies. The area on the left heel slightly improved but still requiring debridement. ooThe large wound  on the buttock is just about closed whereas the smaller one is larger. Using silver alginate in this area 09/04/2018; patient is completing his doxycycline for the left foot although this continues to be [name] very difficult wound area with very adherent necrotic debris. We are using silver alginate to all his wounds right foot left foot and the small wounds on his buttock, silver collagen on the left heel. 09/11/2018; once again this patient has intense erythema and swelling of the left forefoot. Lesser degrees of erythema in the right foot. He has [name] long list of allergies and intolerances. I will reinstitute doxycycline. 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 Using silver alginate ooLeft heel also looks better using silver collagen ooUnfortunately both the areas on his feet look worse. The area on the left first second webspace is now gone through to the plantar part of his foot. The area on the left foot anteriorly is irritated with erythema and swelling in the forefoot. 09/25/2018 ooHis wound on the left plantar heel looks better. Using silver collagen ooThe area on the left buttock 2 small remnant areas. One is closed one is still open. Using silver alginate ooThe areas between both his first and second toes look worse. This in spite of long-standing antifungal therapy with ketoconazole and silver alginate which should have antifungal activity ooHe has small areas around his original wound on the left calf one  is on the bottom of the original scar tissue and one superiorly both of these are small and superficial but again given wound history in this site this is worrisome 10/02/2018 ooLeft plantar heel continues to gradually contract using silver collagen ooLeft buttock wound is unchanged using silver alginate ooThe areas on his dorsal feet between his first and second toes bilaterally look about the same. 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 Small open area here. We have been using silver alginate 10/09/2018; ooLeft plantar heel continues to look healthy and contract. No debridement is required ooLeft buttock slightly smaller with [name] tape injury wound just below which was new this week ooDorsal feet somewhat improved I have been using clindamycin ooLeft lateral looks lower extremity the actual open area looks worse although [name] lot of this is epithelialized. I am going to change to silver collagen today He has [name] lot more swelling in the right leg although this is not pitting not red and not particularly warm there is [name] lot of spasm in the right leg usually indicative of people with paralysis of some underlying discomfort. We have reviewed his vascular status from 2017 he had [name] left greater saphenous vein ablation. I wonder about referring him back to vascular surgery if the area on the left leg continues to deteriorate. 10/16/2018 in today for follow-up and management of multiple lower extremity ulcers. His left Buttock wound is much lower smaller and almost closed completely. The wound to the left ankle has began to reopen with Epithelialization and some adherent slough. He has multiple new areas to the left foot and leg. The left dorsal foot without much improvement. Wound present between left great webspace and  2nd toe. Erythema and edema present right leg. Right LE ultrasound obtained on 10/10/18 was negative for DVT . 10/23/2018; ooLeft buttock is closed over. Still dry macerated  skin but there is no open wound. I suspect this is chronic pressure/moisture ooLeft lateral calf is quite [name] bit worse than when I saw this last. There is clearly drainage here he has macerated skin into the left plantar heel. We will change the primary dressing to alginate ooLeft dorsal foot has some improvement in overall wound area. Still using clindamycin and silver alginate ooRight dorsal foot about the same as the left using clindamycin and silver alginate ooThe erythema in the right leg has resolved. He is DVT rule out was negative ooLeft heel pressure area required debridement although the wound is smaller and the surface is health 10/26/2018 ooThe patient came back in for his nurse check today predominantly because of the drainage coming out of the left lateral leg with [name] recent reopening of his original wound on the left lateral calf. He comes in today with [name] large amount of surrounding erythema around the wound extending from the calf into the ankle and even in the area on the dorsal foot. He is not systemically unwell. He is not febrile. Nevertheless this looks like cellulitis. We have been using silver alginate to the area. I changed him to [name] regular visit and I am going to prescribe him doxycycline. The rationale here is [name] long list of medication intolerances and [name] history of MRSA. I did not see anything that I thought would provide [name] valuable culture 10/30/2018 ooFollow-up from his appointment 4 days ago with really an extensive area of cellulitis in the left calf left lateral ankle and left dorsal foot. I put him on doxycycline. He has [name] long list of medication allergies which are true allergy reactions. 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 In any case he is [name] lot better today. The erythema especially in the anterior and lateral left calf is better. He still has left ankle erythema. He also is complaining about increasing edema in the right leg we have only been using Kerlix Coban and he has been doing the wraps at home. Finally he has [name] spotty rash on the medial part of his upper left calf which looks like folliculitis or perhaps wrap occlusion type injury. Small superficial macules not pustules 11/06/18 patient arrives today with again [name] considerable degree of erythema around the wound on the left lateral calf extending into the dorsal ankle and dorsal foot. This is [name] lot worse than when I saw this last week. He is on doxycycline really with not [name] lot of improvement. He has not been systemically unwell Wounds on the; left heel actually looks improved. Original area on the left foot and proximity to the first and second toes looks about the same. He has superficial areas on the dorsal foot, anterior calf and then the reopening of his original wound on the left lateral calf which looks about the same ooThe only area he has on the right is the dorsal webspace first and second which is smaller. ooHe has [name] large area of dry erythematous skin on the left buttock small open area here. 11/13/2018; the patient arrives in much better condition. The erythema around the wound on the left lateral calf is [name] lot better. Not sure whether this was the clindamycin or the TCA and ketoconazole or just in the improvement in edema control [stasis dermatitis]. In any case this is [name] lot better. The area on the left heel is very small and just about resolved using  silver collagen we have been using silver alginate to the areas on his dorsal feet 11/20/2018; his wounds include the left lateral calf, left heel, dorsal aspects of both feet just proximal to the first second webspace. He is stable to slightly improved. I did not think any changes to his  dressings were going to be necessary 11/27/2018 he has [name] reopening on the left buttock which is surrounded by what looks like tinea or perhaps some other form of dermatitis. The area on the left dorsal foot has some erythema around it I have marked this area but I am not sure whether this is cellulitis or not. Left heel is not closed. Left calf the reopening is really slightly longer and probably worse 1/13; in general things look better and smaller except for the left dorsal foot. Area on the left heel is just about closed, left buttock looks better only [name] small wound remains in the skin looks better [using Lotrisone] 1/20; the area on the left heel only has [name] few remaining open areas here. Left lateral calf about the same in terms of size, left dorsal foot slightly larger right lateral foot still not closed. The area on the left buttock has no open wound and the surrounding skin looks [name] lot better 1/27; the area on the left heel is closed. Left lateral calf better but still requiring extensive debridements. The area on his left buttock is closed. He still has the open areas on the left dorsal foot which is slightly smaller in the right foot which is slightly expanded. We have been using Iodoflex on these areas as well 2/3; left heel is closed. Left lateral calf still requiring debridement using Iodoflex there is no open area on his left buttock however he has dry scaly skin over [name] large area of this. Not really responding well to the Lotrisone. Finally the areas on his dorsal feet at the level of the first second webspace are slightly smaller on the right and about the same on the left. Both of these vigorously debrided with Anasept and gauze 2/10; left heel remains closed he has dry erythematous skin over the left buttock but there is no open wound here. Left lateral leg has come in and with. Still requiring debridement we have been using Iodoflex here. Finally the area on the left dorsal foot and  right dorsal foot are really about the same extremely dry callused fissured areas. He does not yet have [name] dermatology appointment 2/17; left heel remains closed. He has [name] new open area on the left buttock. The area on the left lateral calf is bigger longer and still covered in necrotic debris. No major change in his foot areas bilaterally. I am awaiting for [name] dermatologist to look on this. We have been using ketoconazole I do not know that this is been doing any good at all. 2/24; left heel remains closed. The left buttock wound that was new reopening last week looks better. The left lateral calf appears better also although still requires debridement. The major area on his foot is the left first second also requiring debridement. We have been putting Prisma on all wounds. I do not believe that the ketoconazole has done too much good for his feet. He will use Lotrisone I am going to give him [name] 2-week course of terbinafine. We still do not have [name] dermatology appointment 3/2 left heel remains closed however there is skin over bone in this area I pointed this out to him  today. The left buttock wound is epithelialized but still does not look completely stable. The area on the left leg required debridement were using silver collagen here. With regards to his feet we changed to Lotrisone last week and silver alginate. 3/9; left heel remains closed. Left buttock remains closed. The area on the right foot is essentially closed. The left foot remains unchanged. Slightly smaller on the left lateral calf. Using silver collagen to both of these areas 3/16-Left heel remains closed. Area on right foot is closed. Left lateral calf above the lateral malleolus open wound requiring debridement with easy bleeding. Left dorsal wound proximal to first toe also debrided. Left ischial area open new. Patient has been using Prisma with wrapping every 3 days. Dermatology appointment is apparently tomorrow.Patient has  completed his terbinafine 2-week course with some apparent improvement according to him, there is still flaking and dry skin in his foot on the left 3/23; area on the right foot is reopened. The area on the left anterior foot is about the same still [name] very necrotic adherent surface. He still has the area on the left leg and reopening is on the left buttock. He apparently saw dermatology although I do not have [name] note. According to the patient who is usually fairly well informed they did not have any good ideas. Put him on oral terbinafine which she is been on before. 3/30; using silver collagen to all wounds. Apparently his dermatologist put him on doxycycline and rifampin presumably some culture grew staph. I do not have this result. He remains on terbinafine although I have used terbinafine on him before 4/6; patient has had [name] fairly substantial reopening on the right foot between the first and second toes. He is finished his terbinafine and I believe is on doxycycline and rifampin still as prescribed by dermatology. We have been using silver collagen to all his wounds although the patient reports that he thinks silver alginate does better on the wounds on his buttock. 4/13; the area on his left lateral calf about the same size but it did not require debridement. ooLeft dorsal foot just proximal to the webspace between the first and second toes is about the same. Still nonviable surface. I note some superficial bronze discoloration of the dorsal part of his foot ooRight dorsal foot just proximal to the first and second toes also looks about the same. I still think there may be the same discoloration I noted above on the left ooLeft buttock wound looks about the same 4/20; left lateral calf appears to be gradually contracting using silver collagen. 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 The left dorsal foot wound is debrided of  tightly adherent necrotic debris and really cleans up quite nicely. The right area is worse with expansion. I did not debride this it is now over the base of the second toe ooThe area on his left buttock is smaller no debridement is required using silver collagen 5/4; left calf continues to make good progress. ooHe arrives with erythema around the wounds on his dorsal foot which even extends to the plantar aspect. Very concerning for coexistent infection. He is finished the erythromycin I gave him for possible erythrasma this does not seem to have helped. ooThe area on the left foot is about the same base of the dorsal toes ooIs area on the buttock looks improved on the left 5/11; left calf and left buttock continued to make good progress. Left foot is  about the same to slightly improved. ooMajor problem is on the right foot. He has not had an x-ray. Deep tissue culture I did last week showed both Enterobacter and E. coli. I did not change the doxycycline I put him on empirically although neither 1 of these were plated to doxycycline. He arrives today with the erythema looking worse on both the dorsal and plantar foot. Macerated skin on the bottom of the foot. he has not been systemically unwell 5/18-Patient returns at 1 week, left calf wound appears to be making some progress, left buttock wound appears slightly worse than last time, left foot wound looks slightly better, right foot redness is marginally better. X-ray of both feet show no air or evidence of osteomyelitis. Patient is finished his Omnicef and terbinafine. He continues to have macerated skin on the bottom of the left foot as well as right 5/26; left calf wound is better, left buttock wound appears to have multiple small superficial open areas with surrounding macerated skin. X-rays that I did last time showed no evidence of osteomyelitis in either foot. He is finished cefdinir and doxycycline. I do not think that he was on  terbinafine. He continues to have [name] large superficial open area on the right foot anterior dorsal and slightly between the first and second toes. I did send him to dermatology 2 months ago or so wondering about whether they would do [name] fungal scraping. I do not believe they did but did do [name] culture. We have been using silver alginate to the toe areas, he has been using antifungals at home topically either ketoconazole or Lotrisone. We are using silver collagen on the left foot, silver alginate on the right, silver collagen on the left lateral leg and silver alginate on the left buttock 6/1; left buttock area is healed. We have the left dorsal foot, left lateral leg and right dorsal foot. We are using silver alginate to the areas on both feet and silver collagen to the area on his left lateral calf 6/8; the left buttock apparently reopened late last week. He is not really sure how this happened. He is tolerating the terbinafine. Using silver alginate to all wounds 6/15; left buttock wound is larger than last week but still superficial. ooCame in the clinic today with [name] report of purulence from the left lateral leg I did not identify any infection ooBoth areas on his dorsal feet appear to be better. He is tolerating the terbinafine. Using silver alginate to all wounds 6/22; left buttock is about the same this week, left calf quite [name] bit better. His left foot is about the same however he comes in with erythema and warmth in the right forefoot once again. Culture that I gave him in the beginning of May showed Enterobacter and E. coli. I gave him doxycycline and things seem to improve although neither 1 of these organisms was specifically plated. 6/29; left buttock is larger and dry this week. Left lateral calf looks to me to be improved. Left dorsal foot also somewhat improved right foot completely unchanged. The erythema on the right foot is still present. He is completing the Ceftin dinner that I gave  him empirically [see discussion above.) 7/6 - All wounds look to be stable and perhaps improved, the left buttock wound is slightly smaller, per patient bleeds easily, completed ceftin, the right foot redness is less, he is on terbinafine 7/13; left buttock wound about the same perhaps slightly narrower. Area on the left lateral leg  continues to narrow. Left dorsal foot slightly smaller right foot about the same. We are using silver alginate on the right foot and Hydrofera Blue to the areas on the left. Unna boot on the left 2 layer compression on the right 7/20; left buttock wound absolutely the same. Area on lateral leg continues to get better. Left dorsal foot require debridement as did the right no major change in the 7/27; left buttock wound the same size necrotic debris over the surface. The area on the lateral leg is closed once again. His left foot looks better right foot about the same although there is some involvement now of the posterior first second toe area. He is still on terbinafine which I have given him for [name] month, not certain [name] centimeter major change 06/25/19-All wounds appear to be slightly improved according to report, left buttock wound looks clean, both foot wounds have minimal to no debris the right dorsal foot has minimal slough. We are using Hydrofera Blue to the left and silver alginate to the right foot and ischial wound. 8/10-Wounds all appear to be around the same, the right forefoot distal part has some redness which was not there before, however the wound looks clean and small. Ischial wound looks about the same with no changes 8/17; his wound on the left lateral calf which was his original chronic venous insufficiency wound remains closed. Since I last saw him the areas on the left dorsal foot right dorsal foot generally appear better but require debridement. The area on his left initial tuberosity appears somewhat larger to me perhaps hyper granulated and bleeds  very easily. We have been using Hydrofera Blue to the left dorsal foot and silver alginate to everything else 8/24; left lateral calf remains closed. The areas on his dorsal feet on the webspace of the first and second toes bilaterally both look better. The area on the left buttock which is the pressure ulcer stage II slightly smaller. I change the dressing to Hydrofera Blue to all areas 8/31; left lateral calf remains closed. The area on his dorsal feet bilaterally look better. Using Hydrofera Blue. Still requiring debridement on the left foot. No change in the left buttock pressure ulcers however 9/14; left lateral calf remains closed. Dorsal feet look quite [name] bit better than 2 weeks ago. Flaking dry skin also [name] lot better with the ammonium lactate I gave him 2 weeks ago. The area on the left buttock is improved. He states that his Roho cushion developed [name] leak and he is getting [name] new one, in the interim he is offloading this vigorously 9/21; left calf remains closed. Left heel which was [name] possible DTI looks better this week. He had macerated tissue around the left dorsal foot right foot looks satisfactory and improved left buttock wound. I changed his dressings to his feet to silver alginate bilaterally. Continuing Hydrofera Blue on the left buttock. 9/28 left calf remains closed. Left heel did not develop anything [possible DTI] dry flaking skin on the left dorsal foot. Right foot looks satisfactory. Improved left buttock wound. We are using silver alginate on his feet Hydrofera Blue on the buttock. I have asked him to go back to the Lotrisone on his feet including the wounds and surrounding areas 10/5; left calf remains closed. The areas on the left and right feet about the same. [name] lot of this is epithelialized however debris over the remaining open areas. He is using Lotrisone and silver alginate. The area on the left  buttock using Hydrofera Blue 10/26. Patient has been out for 3 weeks  secondary to Covid concerns. He tested negative but I think his wife tested positive. He comes in today with the left foot substantially worse, right foot about the same. Even more concerning he states that the area on his left buttock closed over but then reopened and is considerably deeper in one aspect than it was before [stage III wound] 11/2; left foot really about the same as last week. Quarter sized wound on the dorsal foot just proximal to the first second toes. Surrounding erythema with areas of denuded epithelium. This is not really much different looking. Did not look like cellulitis this time however. ooRight foot area about the same.. We have been using silver alginate alginate on his toes ooLeft buttock still substantial irritated skin around the wound which I think looks somewhat better. We have been using Hydrofera Blue here. 11/9; left foot larger than last week and [name] very necrotic surface. Right foot I think is about the same perhaps slightly smaller. Debris around the circumference also addressed. Unfortunately on the left buttock there is been [name] decline. Satellite lesions below the major wound distally and now [name] an additional one posteriorly we have been using Hydrofera Blue but I think this is [name] pressure issue 11/16; left foot ulcer dorsally again [name] very adherent necrotic surface. Right foot is about the same. Not much change in the pressure ulcer on his left buttock. 11/30; left foot ulcer dorsally basically the same as when I saw him 2 weeks ago. Very adherent fibrinous debris on the wound surface. Patient reports [name] lot of drainage as well. 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 We have been using Iodoflex, patient changed back to alginate because of the drainage. Area on his right dorsal foot really looks benign with [name] healthier surface certainly [name] lot better than on the left. Left buttock wounds all improved using  Hydrofera Blue 12/7; left dorsal foot again no improvement. Tightly adherent debris. PCR culture I did last week only showed likely skin contaminant. I have gone ahead and done [name] punch biopsy of this which is about the last thing in terms of investigations I can think to do. He has known venous insufficiency and venous hypertension and this could be the issue here. The area on the right foot is about the same left buttock slightly worse according to our intake nurse secondary to [facility] Blue sticking to the wound 12/14; biopsy of the left foot that I did last time showed changes that could be related to wound healing/chronic stasis dermatitis phenomenon no neoplasm. We have been using silver alginate to both feet. I change the one on the left today to Sorbact and silver alginate to his other 2 wounds 12/28; the patient arrives with the following problems; ooMajor issue is the dorsal left foot which continues to be [name] larger deeper wound area. Still with [name] completely nonviable surface ooParadoxically the area mirror image on the right on the right dorsal foot appears to be getting better. ooHe had some loss of dry denuded skin from the lower part of his original wound on the left lateral calf. Some of this area looked [name] little vulnerable and for this reason we put him in wrap that on this side this week ooThe area on his left buttock is larger. He still has the erythematous circular area which I think is [name] combination of pressure, sweat. This  does not look like cellulitis or fungal dermatitis 11/26/2019; -Dorsal left foot large open wound with depth. Still debris over the surface. Using Sorbact ooThe area on the dorsal right foot paradoxically has closed over [facility] has [name] reopening on the left ankle laterally at the base of his original wound that extended up into the calf. This appears clean. ooThe left buttock wound is smaller but with very adherent necrotic debris over the surface. We have  been using silver alginate here as well The patient had arterial studies done in 2017. He had biphasic waveforms at the dorsalis pedis and posterior tibial bilaterally. ABI in the left was 1.17. Digit waveforms were dampened. He has slight spasticity in the great toes I do not think [name] TBI would be possible 1/11; the patient comes in today with [name] sizable reopening between the first and second toes on the right. This is not exactly in the same location where we have been treating wounds previously. According to our intake nurse this was actually fairly deep but 0.6 cm. The area on the left dorsal foot looks about the same the surface is somewhat cleaner using Sorbact, his MRI is in 2 days. We have not managed yet to get arterial studies. The new reopening on the left lateral calf looks somewhat better using alginate. The left buttock wound is about the same using alginate 1/18; the patient had his ARTERIAL studies which were quite normal. ABI in the right at 1.13 with triphasic/biphasic waveforms on the left ABI 1.06 again with triphasic/biphasic waveforms. It would not have been possible to have done [name] toe brachial index because of spasticity. We have been using Sorbac to the left foot alginate to the rest of his wounds on the right foot left lateral calf and left buttock 1/25; arrives in clinic with erythema and swelling of the left forefoot worse over the first MTP area. This extends laterally dorsally and but also posteriorly. Still has an area on the left lateral part of the lower part of his calf wound it is eschared and clearly not closed. ooArea on the left buttock still with surrounding irritation and erythema. ooRight foot surface wound dorsally. The area between the right and first and second toes appears better. 2/1; ooThe left foot wound is about the same. Erythema slightly better I gave him [name] week of doxycycline empirically ooRight foot wound is more extensive extending between the  toes to the plantar surface ooLeft lateral calf really no open surface on the inferior part of his original wound however the entire area still looks vulnerable ooAbsolutely no improvement in the left buttock wound required debridement. 2/8; the left foot is about the same. Erythema is slightly improved I gave him clindamycin last week. ooRight foot looks better he is using Lotrimin and silver alginate ooHe has [name] breakdown in the left lateral calf. Denuded epithelium which I have removed ooLeft buttock about the same were using Hydrofera Blue 2/15; left foot is about the same there is less surrounding erythema. Surface still has tightly adherent debris which I have debriding however not making any progress ooRight foot has [name] substantial wound on the medial right second toe between the first and second webspace. ooStill an open area on the left lateral calf distal area. ooButtock wound is about the same 2/22; left foot is about the same less surrounding erythema. Surface has adherent debris. Polymen Ag Right foot area significant wound between the first and second toes. We have been using silver alginate here  Left lateral leg polymen Ag at the base of his original venous insufficiency wound ooLeft buttock some improvement here 3/1; ooRight foot is deteriorating in the first second toe webspace. Larger and more substantial. We have been using silver alginate. ooLeft dorsal foot about the same markedly adherent surface debris using PolyMem Ag ooLeft lateral calf surface debris using PolyMem AG ooLeft buttock is improved again using PolyMem Ag. ooHe is completing his terbinafine. The erythema in the foot seems better. He has been on this for 2 weeks 3/8; no improvement in any wound area in fact he has [name] small open area on the dorsal midfoot which is new this week. He has not gotten his foot x-rays yet 3/15; his x-rays were both negative for osteomyelitis of both feet. No major change  in any of his wounds on the extremities however his buttock wounds are better. We have been using polymen on the buttocks, left lower leg. Iodoflex on the left foot and silver alginate on the right 3/22; arrives in clinic today with the 2 major issues are the improvement in the left dorsal foot wound which for once actually looks healthy with [name] nice healthy wound surface without debridement. Using Iodoflex here. Unfortunately on the left lateral calf which is in the distal part of his original wound he came to the clinic here for there was purulent drainage noted some increased breakdown scattered around the original area and [name] small area proximally. We we are using polymen here will change to silver alginate today. His buttock wound on the left is better and I think the area on the right first second toe webspace is also improved 3/29; left dorsal foot looks better. Using Iodoflex. Left ankle culture from deterioration last time grew E. coli, Enterobacter and Enterococcus. I will give him [name] course of cefdinir although that will not cover Enterococcus. The area on the right foot in the webspace of the first and second toe lateral first toe looks better. The area on his buttock is about healed Vascular appointment is on April 21. This is to look at his venous system vis--vis continued breakdown of the wounds on the left including the left lateral leg and left dorsal foot he. He has had previous ablations on this side 4/5; the area between the right first and second toes lateral aspect of the first toe looks better. Dorsal aspect of the left first toe on the left foot also improved. Unfortunately the left lateral lower leg is larger and there is [name] second satellite wound superiorly. The usual superficial abrasions on the left buttock overall better but certainly not closed 4/12; the area between the right first and second toes is improved. Dorsal aspect of the left foot also slightly smaller with [name]  vibrant healthy looking surface. No real change in the left lateral leg and the left buttock wound is healed He has an unaffordable co-pay for Apligraf. Appointment with vein and vascular with regards to the left leg venous part of the circulation is on 4/21 4/19; we continue to see improvement in all wound areas. Although this is minor. He has his vascular appointment on 4/21. The area on the left buttock has not reopened although right in the center of this area the skin looks somewhat threatened 4/26; the left buttock is unfortunately reopened. In general his left dorsal foot has [name] healthy surface and looks somewhat smaller although it was not measured as such. The area between his first and second toe webspace on the  right as [name] small wound against the first toe. The patient saw vascular surgery. The real question I was asking was about the small saphenous vein on the left. He has previously ablated left greater saphenous vein. Nothing further was commented on on the left. Right greater saphenous vein without reflux at the saphenofemoral junction or proximal thigh there was no indication for ablation of the right greater saphenous vein duplex was negative for DVT bilaterally. They did not think there was anything from [name] vascular surgery point of view that could be offered. They ABIs within normal limits 5/3; only small open area on the left buttock. The area on the left lateral leg which was his original venous reflux is now 2 wounds both which look clean. We are using Iodoflex on the left dorsal foot which looks healthy and smaller. He is down to [name] very tiny area between the right first and second toes, using silver alginate 5/10; all of his wounds appear better. We have much better edema control in 4 layer compression on the left. This may be the factor that is allowing the left foot and left lateral calf to heal. He has external compression garments at home 04/14/20-All of his wounds are  progressing well, the left forefoot is practically closed, left ischium appears to be about the same, right toe webspace is also smaller. The left lateral leg is about the same, continue using Hydrofera Blue to this, silver alginate to the ischium, Iodoflex to the toe space on the right 6/7; most of his wounds outside of the left buttock are doing well. The area on the left lateral calf and left dorsal foot are smaller. The area on the right foot in between the first and second toe webspace is barely visible although he still says there is some drainage here is the only reason I did not heal this out. ooUnfortunately the area on the left buttock almost looks like he has [name] skin tear from tape. He has open wound and then [name] large flap of skin that we are trying to get adherence over an area just next to the remaining wound 6/21; 2 week follow-up. I believe is been here for nurse visits. Miraculously the area between his first and second toes on the left dorsal foot is closed over. Still open on the right first second web space. The left lateral calf has 2 open areas. Distally this is more superficial. The proximal area had [name] little more depth and required debridement of adherent necrotic material. His buttock wound is actually larger we have been using silver alginate here 6/28; the patient's area on the left foot remains closed. Still open wet area between the first and second toes on the right and also extending into the plantar aspect. We have been using silver alginate in this location. He has 2 areas on the left lower leg part of his original long wounds which I think are better. We have been using Hydrofera Blue here. Hydrofera Blue to the left buttock which is stable 7/12; left foot remains closed. Left ankle is closed. May be [name] small area between his right first and second toes the only truly open area is on the left buttock. We have been using Hydrofera Blue here 7/19; patient arrives with  marked deterioration especially in the left foot and ankle. We did not put him in [name] compression wrap on the left last week in fact he wore his juxta lite stockings on either side although he does  not have an underlying stocking. He has [name] reopening on the left dorsal foot, left lateral ankle and [name] new area on the right dorsal ankle. More worrisome is the degree of erythema on the left foot extending on the lateral foot into the lateral lower leg on the left 7/26; the patient had erythema and drainage from the lateral left ankle last week. Culture of this grew MRSA resistant to doxycycline and clindamycin which are the 2 antibiotics we usually use with this patient who has multiple antibiotic allergies including linezolid, trimethoprim sulfamethoxazole. I had give him an empiric doxycycline and he comes in the area certainly looks somewhat better although it is blotchy in his lower leg. He has not been systemically unwell. He has had areas on the left dorsal foot which is [name] reopening, chronic wounds on the left lateral ankle. Both of these I think are secondary to chronic venous insufficiency. The area between his first and second toes is closed as far as I can tell. He had [name] new wrap injury on the right dorsal ankle last week. Finally he has an area on the left buttock. We have been using silver alginate to everything except the left buttock we are using Hydrofera Blue 06/30/20-Patient returns at 1 week, has been given [name] sample dose pack of NUZYRA which is [name] tetracycline derivative [omadacycline], patient has completed those, we have been using silver alginate to almost all the wounds except the left ischium where we are using Hydrofera Blue all of them look better 8/16; since I last saw the patient he has been doing well. The area on the left buttock, left lateral ankle and left foot are all closed today. He has completed the [place] I gave him last time and tolerated this well. He still has open areas on  the right dorsal ankle and in the right first second toe area which we are using silver alginate. 8/23; we put him in his bilateral external compression stockings last week as he did not have anything open on either leg except for concerning area between the right first and second toe. He comes in today with an area on the left dorsal foot slightly more proximal than the original wound, the left lateral foot but this is actually [name] continuation of the area he had on the left lateral ankle from last time. As well he is opened up on the left buttock again. 8/30; comes in today with things looking [name] lot better. The area on the left lower ankle has closed down as has the left foot but with eschar in both areas. The area on the dorsal right ankle is also epithelialized. Very little remaining of the left buttock wound. We have been using silver alginate on all wound areas 9/13; the area in the first second toe webspace on the right has fully epithelialized. He still has some vulnerable epithelium on the right and the ankle and the dorsal foot. He notes weeping. He is using his juxta lite stocking. On the left again the left dorsal foot is closed left lateral ankle is closed. We went to the juxta lite stocking here as well. ooStill vulnerable in the left buttock although only 2 small open areas remain here 9/27; 2-week follow-up. We did not look at his left leg but the patient says everything is closed. He is [name] bit disturbed by the amount of edema in his left foot he is using juxta lite stockings but asking about over the toes stockings which would  be 30/40, will talk to him next time. According to him there is no open wound on either the left foot or the left ankle/calf He has an open area on the dorsal right calf which I initially point [name] wrap injury. He has superficial remaining wound on the left ischial tuberosity been using silver alginate although he says this sticks to the wound 10/5; we gave him  2-week follow-up but he called yesterday expressing some concerns about his right foot right ankle and the left buttock. He came in early. There is still no open areas on the left leg and that still in his juxta lite stocking 10/11; he only has 1 small area on the left buttock that remains measuring millimeters 1 mm. Still has the same irritated skin in this area. We recommended zinc oxide when this eventually closes and pressure relief is meticulously is he can do this. He still has an area on the dorsal part of his right first through third toes which is [name] bit irritated and still open and on the dorsal ankle near the crease of the ankle. We have been using silver alginate and using his own stocking. He has nothing open on the left leg or foot 10/25; 2-week follow-up. Not nearly as good on the left buttock as I was hoping. For open areas with 5 looking threatened small. He has the erythematous irritated chronic skin in this area. oo1 area on the right dorsal ankle. He reports this area bleeds easily ooRight dorsal foot just proximal to the base of his toes ooWe have been using silver alginate. 11/8; 2-week follow-up. Left buttock is about the same although I do not think the wounds are in the same location we have been using silver alginate. I have asked him to use zinc oxide on the skin around the wounds. ooHe still has [name] small area on the right dorsal ankle he reports this bleeds easily ooRight dorsal foot just proximal to the base of the toes does not have anything open although the skin is very dry and scaly ooHe has [name] new opening on the nailbed of the left great toe. Nothing on the left ankle 11/29; 3-week follow-up. Left buttock has 2 open areas. And washing of these wounds today started bleeding easily. Suggesting very friable tissue. We have been using silver alginate. Right dorsal ankle which I thought was initially [name] wrap injury we have been using silver alginate. Nothing open  between the toes that I can see. He states the area on the left dorsal toe nailbed healed after the last visit in 2 or 3 days 12/13; 3-week follow-up. His left buttock now has 3 open areas but the original 2 areas are smaller using polymen here. Surrounding skin looks better. The right dorsal ankle is closed. He has [name] small opening on the right dorsal foot at the level of the third toe. In general the skin looks better here. He is wearing his juxta lite stocking on the left leg says there is nothing open 11/24/2020; 3 weeks follow-up. His left buttock still has the 3 open areas. We have been using polymen but due to lack of response he changed to [facility] area. Surrounding skin is dry erythematous and irritated looking. There is no evidence of infection either bacterial or fungal however there is loss of surface epithelium ooHe still has very dry skin in his foot causing irritation and erythema on the dorsal part of his toes. This is not responded to prolonged courses  of antifungal simply looks dry and irritated 1/24; left buttock area still looks about the same he was unable to find the triad ointment that we had suggested. The area on the right lower leg just above the dorsal ankle has reopened and the areas on the right foot between the first second and second third toes and scaling on the bottom of the foot has been about the same for quite some time now. been using silver alginate to all wound areas 2/7; left buttock wound looked quite good although not much smaller in terms of surface area surrounding skin looks better. Only [name] few dry flaking areas on the right foot in between the first and second toes the skin generally looks better here [ammonium lactate]. Finally the area on the right dorsal ankle is closed 2/21; ooThere is no open area on the right foot even between the right first and second toe. Skin around this area dorsally and plantar aspects look better. ooHe has [name] reopening  of the area on the right ankle just above the crease of the ankle dorsally. I continue to think that this is probably friction from spasms may be even this time with his stocking under the compression stockings. ooWounds on his left buttock look about the same there [name] couple of areas that have reopened. He has [name] total square area of loss of epithelialization. This does not look like infection it looks like [name] contact dermatitis but I just cannot determine to what 3/14; there is nothing on the right foot between the first and second toes this was carefully inspected under illumination. Some chronic irritation on the dorsal part of his foot from toes 1-3 at the base. Nothing really open here substantially. Still has an area on the right foot/ankle that is actually larger and hyper granulated. His buttock area on the left is just about closed however he has chronic inflammation with loss of the surface epithelial layer 3/28; 2-week follow-up. In clinic today with [name] new wound on the left anterior mid tibia. Says this happened about 2 weeks ago. He is not really sure how wonders about the spasticity of his legs at night whether that could have caused this other than that he does not have [name] good idea. He has been using topical antibiotics and silver alginate. The area on his right dorsal ankle seems somewhat better. ooFinally everything on his left buttock is closed. 4/11; 2-week follow-up. All of his wounds are better except for the area over the ischium and left buttock which have opened up widely again. At least part of this is covered in necrotic fibrinous material another part had rolled nonviable skin. The area on the right ankle, left anterior mid tibia are both [name] lot better. He had no open wounds on either foot including the areas between the first and second toes 4/25; patient presents for 2-week follow-up. He states that the wounds are overall stable. He has no complaints today and states he is  using Hydrofera Blue to open wounds. 5/9; have not seen this man in over [name] month. For my memory he has open areas on the left mid tibia and right ankle. T oday he has new open area on the right dorsal foot which we have not had [name] problem with recently. He has the sustained area on the left buttock He is also changed his insurance at the beginning of the year [employer]. We will need prior authorizations for debridement 5/23; patient presents for 2-week follow-up.  He has prior authorizations for debridement. He denies any issues in the past 2 weeks with his wound care. He has been using Hydrofera Blue to all the wounds. He does report [name] circular rash to the upper left leg that is new. He denies acute signs of infection. 6/6; 2-week follow-up. The patient has open wounds on the left buttock which are worse than the last time I saw this about [name] month ago. He also has [name] new area to me on the left anterior mid tibia with some surrounding erythema. The area on the dorsal ankle on the right is closed but I think this will be [name] friction injury every time this area is exposed to either our wraps or his compression stockings caused by unrelenting spasms in this leg. 6/20; 2-week follow-up. oo The patient has open wounds on the left buttock which is about the same. Using [facility] here. - The left mid tibia has [name] static amount of surrounding erythema. Also [name] raised area in the center. We have been using Hydrofera Blue here. ooooFinally he has broken down in his dorsal right foot extending between the first and second toes and going to the base of the first and second toe webspace. I have previously assumed that this was severe venous hypertension 7/5; 2-week follow-up oo The left buttock wound actually looks better. We are using Hydrofera Blue. He has extensive skin irritation around this area and I have not really been able to get that any better. I have tried Lotrisone i.e. antifungals and  steroids. More most recently we have just been using Coloplast really looks about the same. oo The left mid tibia which was new last week culture to have very resistant staph aureus. Not only methicillin-resistant but doxycycline resistant. The patient has [name] plethora of antibiotic allergies including sulfa, linezolid. I used topical bacitracin on this but he has not started this yet. oo In addition he has an expanding area of erythema with [name] wound on the dorsal right foot. I did [name] deep tissue culture of this area today 7/12; oo Left buttock area actually looks better surrounding skin also looks less irritated. oo Left mid tibia looks about the same. He is using bacitracin this is not worse oo Right dorsal foot looks about the same as well. oo The left first toe also looks about the same 7/19; left buttock wound continues to improve in terms of open areas oo Left mid tibia is still concerning amount of swelling he is using bacitracin oo Dorsal left first toe somewhat smaller oo Right dorsal foot somewhat smaller 7/25; left buttock wound actually continues to improve oo Left mid tibia area has less swelling. I gave him all my samples of new Nuzyra. This seems to have helped although the wound is still open it. His abrasion closed by here oo Left dorsal great toe really no better. Still [name] very nonviable surface oo Right dorsal foot perhaps some better. oo We have been using bacitracin and silver nitrate to the areas on his lower legs and Hydrofera Blue to the area on the buttock. 8/16 oo Disappointed that his left buttock wound is actually more substantial. Apparently during the last nurse visit these were both very small. He has continued irritation to [name] large area of skin on his buttock. I have never been able to totally explain this although I think it some combination of the way he sits, pressure, moisture. He is not incontinent enough to contribute to this. oo Left  dorsal  great toe still fibrinous debris on the surface that I have debrided today oo Large area across the dorsal right toes. oo The area on the left anterior mid tibia has less swelling. He completed the [place]. This does not look infected although the tissue is still fried 8/30; 2-week follow-up. oo Left buttock areas not improved. We used Hydrofera Blue on this. Weeping wet with the surrounding erythema that I have not been able to control even with Lotrisone and topical Coloplast oo Left dorsal great toe looks about the same oo More substantial area again at the base of his toes on the left which is new this week. oo Area across the dorsal right toes looks improved oo The left anterior mid tibia looks like it is trying to close 9/13; 2-week follow-up. Using silver alginate on all of his wounds. The left dorsal foot does not look any better. He has the area on the dorsal toe and also the areas at the base of all of his toes 1 through 3. On the right foot he has [name] similar pattern in [name] similar area. He has the area on his left mid tibia that looks fairly healthy. Finally the area on his left buttock looks somewhat bette 9/20; culture I did of the left foot which was [name] deep tissue culture last time showed E. coli he has erythema around this wound. Still [name] completely necrotic surface. His right dorsal foot looks about the same. He has [name] very friable surface to the left anterior mid tibia. Both buttock wounds look better. We have been using silver alginate to all wounds 10/4; he has completed the cephalexin that I gave him last time for the left foot. He is using topical gentamicin under silver alginate silver alginate being applied to all the wounds. Unfortunately all the wounds look irritated on his dorsal right foot dorsal left foot left mid tibia. I wonder if this could be [name] silver allergy. I am going to change him to [facility] on the lower extremity. The skin on the left buttock and left  posterior thigh still flaking dry and irritated. This has continued no matter what I have applied topically to this. He has [name] solitary open wound which by itself does not look too bad however the entire area of surrounding skin does not change no matter what we have applied here 10/18; the area on the left dorsal foot and right dorsal foot both look better. The area on the right extends into the plantar but not between his toes. We have been using silver alginate. He still has [name] rectangular erythematous area around the area on the left tibia. The wound itself is very small. Finally everything on his left buttock looks [name] little larger the skin is erythematous 11/15; patient comes in with the left dorsal and right dorsal foot distally looking somewhat better. Still nonviable surface on the left foot which required debridement. He still has the area on the left anterior mid tibia although this looks somewhat better. He has [name] new area on the right lateral lower leg just above the ankle. Finally his left buttock looks terrible with multiple superficial open wounds geometric square shaped area of chronic erythema which I have not been able to sort through 11/29; right dorsal foot and left dorsal foot both look somewhat better. No debridement required. He has the fragile area on the left anterior mid tibia this looks and continues to look somewhat better. Right lateral lower leg just  above the ankle we identified last time also looks better. In general the area on his left buttock looks improved. We are using Hydrofera Blue to all wound areas 12/13; right dorsal foot looks better. The area on the right lateral leg is healed. Left dorsal foot has 2 open areas both of which require debridement. The fragile area on the left anterior mid tibial looks better. Smaller area on his buttocks. Were using Hydrofera Blue 1/10; patient comes in with everything looking slightly larger and/or worse. This includes his left  buttock, reopening of the left mid tibia, larger areas on the left dorsal foot and what looks to be [name] cellulitis on the right dorsal and plantar foot. We have been using Hydrofera Blue on all wounds. 1/17; right dorsal foot distally looks better today. The left foot has 2 open wounds that are about the same surrounding erythema. 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 No malignancy they did not stain for fungus 1/24; his right distal foot is not closed dry and scaly but the wound looks like it is contracting. I did not debride anything here. Problem on his left dorsal foot with expanding erythema. Apparently there were problems last week getting the Elesa Hacker however it is now available at the [employer] [facility] but [name] week later. He is using ketoconazole and Coloplast to the left buttock along with Hydrofera Blue this actually looks quite [name] bit better today. 1/31; right dorsal foot again is dry and scaly but looking to contract. He has been using [name] moisturizer on his feet at my request but he is not sure which 1. The left dorsal foot wounds look about the same there is erythema here that I marked last week however after course of Nuzyra it certainly is not any better but not any worse either. Finally on his left buttock the skin continues to look better he has the original wound but [name] new substantial area towards the gluteal cleft. Almost like [name] skin tear. I used scissors to remove skin and subcutaneous tissue here silver nitrate and direct pressure 2/7; right dorsal foot. This does not look too much different from last week. Some erythema skin dry and scaly. No debridement. Left dorsal great toe again still not much improvement. I did remove flaking dry skin and callus from around the edge. Finally on his left buttock. The skin is somewhat better in the periwound.  Surface wounds are superficial somewhat better than last week. 01/26/2022: Is [name] little bit of [name] mystery as to why his wounds fail to respond to treatments and actually seem to get worse. This is my first encounter with this patient. He was previously followed by Dr. Dellia Nims. Based upon my review of the chart, it seems that there is [name] little bit of [name] mystery as to why his wounds do not respond as anticipated to the interventions applied and sometimes even get worse. Biopsies have been performed and he was seen by dermatology in [place], but that did not shed any light on the matter. T oday, his gluteal wound is larger, with substantial drainage, rather malodorous. The food wounds are not terrible, but he has [name] lot of callus and scaly skin around these. He is currently getting silver alginate on the gluteal wound, with idodoflex to the feet. He is using lotrisone on his legs for the dry, scaly skin. 02/09/2022: There has really been  no change to any of his wounds. The gluteal wound less drainage and odor, but remains about the same size, the periwound skin remains oddly scaly. His lower extremity wounds also appear roughly the same size. They continue to accumulate [name] small amount of slough. The periwound on his feet and ankle wounds has dry eschar and loose dead skin. We have been using silver alginate on the gluteal wound and Iodoflex on his feet and ankle wounds. T the periwound around his gluteal lesion and Lotrisone on his feet and legs. o 02/23/2022: The right plantar foot wound is closed. The gluteal site looks small but has continued to produce hypertrophic granulation tissue. The foot wounds all look about the same on the dorsal surface of the right foot; on the left, there is only [name] small open area at the site of where his left great toenail would have been. 03/16/2022: The right ankle wound is healed. The right dorsal foot wound is about the same. The left dorsal foot wound is quite [name] bit smaller  and the ischial wound is nearly closed. 03/30/2022: The right ankle wound reopened. Both dorsal foot wounds are quite [name] bit smaller. Unfortunately, he appears to have sheared part of his ischial wound open further, perhaps during [name] transfer. 04/13/2022: The right ankle wound has hypertrophic granulation tissue present. The dorsal foot wounds continue to decrease in size. The ischial wound looks about the same today, no better, no worse. 04/27/2022: The right ankle wound has closed. Unfortunately, it looks like some moisture got underneath the dry skin on both of his dorsal feet and these wounds have expanded in size. The ischial wound remains the same with perhaps [name] little bit more slough accumulation than at our previous visit. 05/11/2022: The right ankle wound remains closed. There is [name] left anterior tibial wound that is small has patchy openings with accumulated slough. The dorsum of his right foot appears to be nearly healed with just [name] small punctate opening. The plantar surface of his right foot has [name] new opening that looks like he may have picked some skin there. His sacral ulcer has hypertrophic granulation tissue but has some slough accumulation. The dorsum of his left foot has multiple open areas in [name] fairly ragged distribution. All of these have slough accumulated within them. Patient History Information obtained from Patient. Family History Diabetes - Father, Hypertension - Mother, No family history of Cancer, Heart Disease, Hereditary Spherocytosis, Kidney Disease, Lung Disease, Stroke, Thyroid Problems, Tuberculosis. Social History Former smoker, Marital Status - Married, Alcohol Use - Moderate, Drug Use - No History, Caffeine Use - Daily. Medical History Ear/Nose/Mouth/Throat Denies history of Chronic sinus problems/congestion, Middle ear problems Hematologic/Lymphatic Denies history of Anemia, Hemophilia, Human Immunodeficiency Virus, Lymphedema, Sickle Cell  Disease Respiratory Patient has history of Sleep Apnea - does not tolerate cpap Denies history of Aspiration, Asthma, Chronic Obstructive Pulmonary Disease (COPD), Pneumothorax, Tuberculosis Cardiovascular Patient has history of Hypertension - lisinopril HCTZ Denies history of Angina, Arrhythmia, Congestive Heart Failure, Coronary Artery Disease, Deep Vein Thrombosis, Hypotension, Myocardial Infarction, Peripheral Arterial Disease, Peripheral Venous Disease, Phlebitis, Vasculitis Gastrointestinal Denies history of Cirrhosis , Colitis, Crohnoos, Hepatitis [name], Hepatitis B, Hepatitis C Endocrine Denies history of Type I Diabetes, Type II Diabetes Genitourinary Denies history of End Stage Renal Disease Immunological Denies history of Lupus Erythematosus, Raynaudoos, Scleroderma Integumentary (Skin) Denies history of History of Burn Musculoskeletal Denies history of Gout, Rheumatoid Arthritis, Osteoarthritis, Osteomyelitis Neurologic Patient has history of Paraplegia Denies history of Dementia, Neuropathy, Quadriplegia,  Seizure Disorder Oncologic Denies history of Received Chemotherapy, Received Radiation Psychiatric Denies history of Anorexia/bulimia, Confinement Anxiety Hospitalization/Surgery History - cellulitis in leg. - left leg vein ablation. Objective Constitutional Tachycardic, asymptomatic.Marland Kitchen No acute distress.. Vitals Time Taken: 8:35 AM, Height: 70 in, Source: Stated, Weight: 216 lbs, Source: Stated, BMI: 31, Temperature: 98.3 F, Pulse: 115 bpm, Respiratory Rate: 18 breaths/min, Blood Pressure: 125/73 mmHg. Respiratory Normal work of breathing on room air.. General Notes: 05/11/2022: The right ankle wound remains closed. There is [name] left anterior tibial wound that is small has patchy openings with accumulated slough. The dorsum of his right foot appears to be nearly healed with just [name] small punctate opening. The plantar surface of his right foot has [name] new opening  that looks like he may have picked some skin there. His sacral ulcer has hypertrophic granulation tissue but has some slough accumulation. The dorsum of his left foot has multiple open areas in [name] fairly ragged distribution. All of these have slough accumulated within them. Integumentary (Hair, Skin) Wound #41R status is Open. Original cause of wound was Gradually Appeared. The date acquired was: 03/16/2020. The wound has been in treatment 112 weeks. The wound is located on the Left Ischium. The wound measures 2.4cm length x 3cm width x 0.1cm depth; 5.655cm^2 area and 0.565cm^3 volume. There is Fat Layer (Subcutaneous Tissue) exposed. There is no tunneling or undermining noted. There is [name] medium amount of purulent drainage noted. Foul odor after cleansing was noted. The wound margin is distinct with the outline attached to the wound base. There is large (67-100%) pink, friable granulation within the wound bed. There is no necrotic tissue within the wound bed. Wound #52 status is Open. Original cause of wound was Gradually Appeared. The date acquired was: 03/27/2021. The wound has been in treatment 58 weeks. The wound is located on the Right,Dorsal Foot. The wound measures 0.1cm length x 0.1cm width x 0.1cm depth; 0.008cm^2 area and 0.001cm^3 volume. There is no tunneling or undermining noted. There is [name] small amount of serous drainage noted. The wound margin is flat and intact. There is no granulation within the wound bed. There is no necrotic tissue within the wound bed. Wound #56 status is Open. Original cause of wound was Gradually Appeared. The date acquired was: 07/11/2021. The wound has been in treatment 42 weeks. The wound is located on the Left,Dorsal Foot. The wound measures 2.4cm length x 4.7cm width x 0.1cm depth; 8.859cm^2 area and 0.886cm^3 volume. There is Fat Layer (Subcutaneous Tissue) exposed. There is no tunneling or undermining noted. There is [name] medium amount of serosanguineous drainage  noted. The wound margin is flat and intact. There is large (67-100%) red, pink granulation within the wound bed. There is [name] small (1-33%) amount of necrotic tissue within the wound bed including Adherent Slough. Wound #63 status is Open. Original cause of wound was Gradually Appeared. The date acquired was: 05/11/2022. The wound is located on the Left,Anterior Lower Leg. The wound measures 0.5cm length x 0.4cm width x 0.1cm depth; 0.157cm^2 area and 0.016cm^3 volume. There is Fat Layer (Subcutaneous Tissue) exposed. There is no tunneling or undermining noted. There is [name] medium amount of serosanguineous drainage noted. The wound margin is flat and intact. There is medium (34-66%) red, friable granulation within the wound bed. There is [name] medium (34-66%) amount of necrotic tissue within the wound bed including Adherent Slough. Wound #64 status is Open. Original cause of wound was Gradually Appeared. The date acquired was: 05/11/2022. The  wound is located on the [place]. The wound measures 0.7cm length x 0.2cm width x 0.1cm depth; 0.11cm^2 area and 0.011cm^3 volume. There is Fat Layer (Subcutaneous Tissue) exposed. There is no tunneling or undermining noted. There is [name] small amount of serous drainage noted. The wound margin is flat and intact. There is large (67- 100%) red granulation within the wound bed. There is no necrotic tissue within the wound bed. 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 There was [name] Selective/Open Wound Skin/Epidermis Debridement with [name] total area of 11.28 sq cm performed by Fredirick Maudlin, MD. With the following instrument(s): Curette to remove Non- Viable tissue/material. Material removed includes Slough and Skin: Epidermis and. No specimens were taken. [name] time out was conducted at 09:15, prior to the start of the procedure. [name] Minimum amount of bleeding was controlled with Pressure. The procedure was tolerated well with [name] pain level of Insensate throughout and [name] pain level of Insensate following the procedure. Post Debridement Measurements: 2.4cm length x 4.7cm width x 0.1cm depth; 0.886cm^3 volume. Character of Wound/Ulcer Post Debridement is improved. Post procedure Diagnosis Wound #56: Same as Pre-Procedure Wound #63 Pre-procedure diagnosis of Wound #63 is [name] Venous Leg Ulcer located on the Left,Anterior Lower Leg .Severity of Tissue Pre Debridement is: Fat layer exposed. There was [name] Excisional Skin/Subcutaneous Tissue Debridement with [name] total area of 0.2 sq cm performed by Fredirick Maudlin, MD. With the following instrument(s): Curette to remove Non-Viable tissue/material. Material removed includes Subcutaneous Tissue and Slough and. No specimens were taken. [name] time out was conducted at 09:15, prior to the start of the procedure. [name] Minimum amount of bleeding was controlled with Pressure. The procedure was tolerated well with [name] pain level of Insensate throughout and [name] pain level of Insensate following the procedure. Post Debridement Measurements: 0.5cm length x 0.4cm width x 0.1cm depth; 0.016cm^3 volume. Character of Wound/Ulcer Post Debridement is improved. Severity of Tissue Post Debridement is: Fat layer exposed. Post procedure Diagnosis Wound #63: Same as Pre-Procedure Wound #64 Pre-procedure diagnosis of Wound #64 is [name]  Lymphedema located on the Right,Plantar Foot . There was [name] Selective/Open Wound Non-Viable Tissue Debridement with [name] total area of 0.14 sq cm performed by Fredirick Maudlin, MD. With the following instrument(s): Curette to remove Non-Viable tissue/material. Material removed includes [facility]. No specimens were taken. [name] time out was conducted at 09:15, prior to the start of the procedure. [name] Minimum amount of bleeding was controlled with Pressure. The procedure was tolerated well with [name] pain level of Insensate throughout and [name] pain level of Insensate following the procedure. Post Debridement Measurements: 0.7cm length x 0.2cm width x 0.1cm depth; 0.011cm^3 volume. Character of Wound/Ulcer Post Debridement is improved. Post procedure Diagnosis Wound #64: Same as Pre-Procedure Wound #41R Pre-procedure diagnosis of Wound #41R is [name] Pressure Ulcer located on the Left Ischium . An Chemical Cauterization procedure was performed by Fredirick Maudlin, MD. Post procedure Diagnosis Wound #41R: Same as Pre-Procedure Notes:  using silver nitrate stick Plan Follow-up Appointments: Return appointment in 3 weeks. - Dr Celine Ahr - Room 1 - Tuesday 7/11 @ 8:15 am Bathing/ Shower/ Hygiene: May shower and wash wound with soap and water. - on days that dressing is changed Edema Control - Lymphedema / SCD / Other: Elevate legs to the level of the heart or above for 30 minutes daily and/or when sitting, [name] frequency of: - throughout the day Moisturize legs daily. - using Aquaphor generously to both legs and feet with dressing changes Compression stocking or Garment 30-40 mm/Hg pressure to: - Juxtalite to both legs daily Off-Loading: Roho cushion for wheelchair Turn and reposition every 2 hours WOUND #41R: - Ischium Wound Laterality: Left Cleanser: Soap and Water Every Other Day/30 Days Discharge Instructions: May shower and wash wound with dial antibacterial soap and water prior to dressing change. Peri-Wound Care: Zinc  Oxide Ointment 30g tube Every Other Day/30 Days Discharge Instructions: Apply Zinc Oxide to periwound with each dressing change Prim Dressing: KerraCel Ag Gelling Fiber Dressing, 4x5 in (silver alginate) (Generic) Every Other Day/30 Days ary Discharge Instructions: Apply silver alginate to wound bed as instructed Secondary Dressing: ABD Pad, 8x10 (Generic) Every Other Day/30 Days Discharge Instructions: Apply over primary dressing as directed. Secondary Dressing: Woven Gauze Sponge, Non-Sterile 4x4 in Every Other Day/30 Days Discharge Instructions: Apply over primary dressing as directed. Secured With: 31M Medipore H Soft Cloth Surgical T ape, 4 x 10 (in/yd) Every Other Day/30 Days Discharge Instructions: Secure with tape as directed. WOUND #52: - Foot Wound Laterality: Dorsal, Right Cleanser: Soap and Water Every Other Day/30 Days Discharge Instructions: May shower and wash wound with dial antibacterial soap and water prior to dressing change. Peri-Wound Care: Triamcinolone 15 (g) Every Other Day/30 Days Discharge Instructions: Use triamcinolone 15 (g) as directed Peri-Wound Care: Sween Lotion (Moisturizing lotion) Every Other Day/30 Days Discharge Instructions: Apply Aquaphor moisturizing lotion as directed Peri-Wound Care: Lotrisone Every Other Day/30 Days Discharge Instructions: Apply Lotrisone to periwound Prim Dressing: KerraCel Ag Gelling Fiber Dressing, 2x2 in (silver alginate) Every Other Day/30 Days ary Discharge Instructions: Apply silver alginate to wound bed as instructed Secondary Dressing: Woven Gauze Sponge, Non-Sterile 4x4 in Every Other Day/30 Days Discharge Instructions: Apply over primary dressing as directed. Secured With: [employer], 4.5x3.1 (in/yd) (Generic) Every Other Day/30 Days Discharge Instructions: Secure with Kerlix as directed. Secured With: 31M Medipore H Soft Cloth Surgical T ape, 4 x 10 (in/yd) (Generic) Every Other Day/30 Days Discharge  Instructions: Secure with tape as directed. WOUND #56: - Foot Wound Laterality: Dorsal, Left Cleanser: Soap and Water Every Other Day/30 Days Discharge Instructions: May shower and wash wound with dial antibacterial soap and water prior to dressing change. Peri-Wound Care: Triamcinolone 15 (g) Every Other Day/30 Days Discharge Instructions: Use triamcinolone 15 (g) as directed Peri-Wound Care: Sween Lotion (Moisturizing lotion) Every Other Day/30 Days Discharge Instructions: Apply Aquaphor moisturizing lotion as directed Peri-Wound Care: Lotrisone Every Other Day/30 Days Discharge Instructions: Apply Lotrisone to periwound Prim Dressing: KerraCel Ag Gelling Fiber Dressing, 2x2 in (silver alginate) Every Other Day/30 Days ary Discharge Instructions: Apply silver alginate to wound bed as instructed Secondary Dressing: Woven Gauze Sponge, Non-Sterile 4x4 in Every Other Day/30 Days Discharge Instructions: Apply over primary dressing as directed. Secured With: [employer], 4.5x3.1 (in/yd) (Generic) Every Other Day/30 Days Discharge Instructions: Secure with Kerlix as directed. Secured With: 31M Medipore H Soft Cloth Surgical T ape, 4 x 10 (in/yd) (Generic) Every Other Day/30 Days Discharge Instructions:  Secure with tape as directed. WOUND #63: - Lower Leg Wound Laterality: Left, Anterior Cleanser: Soap and Water Every Other Day/30 Days Discharge Instructions: May shower and wash wound with dial antibacterial soap and water prior to dressing change. Peri-Wound Care: Triamcinolone 15 (g) Every Other Day/30 Days Discharge Instructions: Use triamcinolone 15 (g) as directed Peri-Wound Care: Sween Lotion (Moisturizing lotion) Every Other Day/30 Days Discharge Instructions: Apply Aquaphor moisturizing lotion as directed Peri-Wound Care: Lotrisone Every Other Day/30 Days Discharge Instructions: Apply Lotrisone to periwound Prim Dressing: KerraCel Ag Gelling Fiber Dressing, 2x2 in (silver  alginate) Every Other Day/30 Days ary Discharge Instructions: Apply silver alginate to wound bed as instructed Secondary Dressing: Woven Gauze Sponge, Non-Sterile 4x4 in Every Other Day/30 Days Discharge Instructions: Apply over primary dressing as directed. Secured With: [employer], 4.5x3.1 (in/yd) (Generic) Every Other Day/30 Days Discharge Instructions: Secure with Kerlix as directed. Secured With: 653M Medipore H Soft Cloth Surgical T ape, 4 x 10 (in/yd) (Generic) Every Other Day/30 Days Discharge Instructions: Secure with tape as directed. WOUND #64: - Foot Wound Laterality: Plantar, Right Cleanser: Soap and Water Every Other Day/30 Days Discharge Instructions: May shower and wash wound with dial antibacterial soap and water prior to dressing change. Peri-Wound Care: Triamcinolone 15 (g) Every Other Day/30 Days Discharge Instructions: Use triamcinolone 15 (g) as directed Peri-Wound Care: Sween Lotion (Moisturizing lotion) Every Other Day/30 Days Discharge Instructions: Apply Aquaphor moisturizing lotion as directed Peri-Wound Care: Lotrisone Every Other Day/30 Days Discharge Instructions: Apply Lotrisone to periwound Prim Dressing: KerraCel Ag Gelling Fiber Dressing, 2x2 in (silver alginate) Every Other Day/30 Days ary Discharge Instructions: Apply silver alginate to wound bed as instructed Secondary Dressing: Woven Gauze Sponge, Non-Sterile 4x4 in Every Other Day/30 Days Discharge Instructions: Apply over primary dressing as directed. Secured With: [employer], 4.5x3.1 (in/yd) (Generic) Every Other Day/30 Days Discharge Instructions: Secure with Kerlix as directed. Secured With: 653M Medipore H Soft Cloth Surgical T ape, 4 x 10 (in/yd) (Generic) Every Other Day/30 Days Discharge Instructions: Secure with tape as directed. 05/11/2022: The right ankle wound remains closed. There is [name] left anterior tibial wound that is small has patchy openings with accumulated slough. The  dorsum of his right foot appears to be nearly healed with just [name] small punctate opening. The plantar surface of his right foot has [name] new opening that looks like he may have picked some skin there. His sacral ulcer has hypertrophic granulation tissue but has some slough accumulation. The dorsum of his left foot has multiple open areas in [name] fairly ragged distribution. All of these have slough accumulated within them. I used [name] curette to debride slough, eschar, and nonviable subcutaneous tissue from the patient's wounds. I used silver nitrate to chemically cauterize the hypertrophic granulation tissue on his sacral/buttock ulcer. We will continue using silver alginate. Follow-up in 2 weeks. Electronic Signature(s) Signed: 05/11/2022 9:42:04 AM By: Fredirick Maudlin MD FACS Entered By: Fredirick Maudlin on 05/11/2022 09:42:04 -------------------------------------------------------------------------------- HxROS Details Patient Name: Date of Service: Ebright, [name] 05/11/2022 8:15 [name] Medical Record Number: [id number] Patient Account Number: [id number] Date of Birth/Sex: Treating RN: 12/18/1985 (36 y.o. [name]: Thayer, [place] Other Clinician: Referring Provider: Treating Provider/Extender: Cornelia Copa [name]: 331 Information Obtained From Patient Ear/Nose/Mouth/Throat Medical History: Negative for: Chronic sinus problems/congestion; Middle ear problems Hematologic/Lymphatic Medical History: Negative for: Anemia; Hemophilia; Human Immunodeficiency Virus; Lymphedema; Sickle Cell Disease Respiratory Medical History: Positive for: Sleep Apnea - does not  tolerate cpap Negative for: Aspiration; Asthma; Chronic Obstructive Pulmonary Disease (COPD); Pneumothorax; Tuberculosis Cardiovascular Medical History: Positive for: Hypertension - lisinopril HCTZ Negative for: Angina; Arrhythmia; Congestive Heart Failure; Coronary Artery  Disease; Deep Vein Thrombosis; Hypotension; Myocardial Infarction; Peripheral Arterial Disease; Peripheral Venous Disease; Phlebitis; Vasculitis Gastrointestinal Medical History: Negative for: Cirrhosis ; Colitis; Crohns; Hepatitis [name]; Hepatitis B; Hepatitis C Endocrine Medical History: Negative for: Type I Diabetes; Type II Diabetes Genitourinary Medical History: Negative for: End Stage Renal Disease Immunological Medical History: Negative for: Lupus Erythematosus; Raynauds; Scleroderma Integumentary (Skin) Medical History: Negative for: History of Burn Musculoskeletal Medical History: Negative for: Gout; Rheumatoid Arthritis; Osteoarthritis; Osteomyelitis Neurologic Medical History: Positive for: Paraplegia Negative for: Dementia; Neuropathy; Quadriplegia; Seizure Disorder Oncologic Medical History: Negative for: Received Chemotherapy; Received Radiation Psychiatric Medical History: Negative for: Anorexia/bulimia; Confinement Anxiety Immunizations Pneumococcal Vaccine: Received Pneumococcal Vaccination: No Immunization Notes: doesn't remember when last tetanus shot was Implantable Devices No devices added Hospitalization / Surgery History Type of Hospitalization/Surgery cellulitis in leg left leg vein ablation Family and Social History Cancer: No; Diabetes: Yes - Father; Heart Disease: No; Hereditary Spherocytosis: No; Hypertension: Yes - Mother; Kidney Disease: No; Lung Disease: No; Stroke: No; Thyroid Problems: No; Tuberculosis: No; Former smoker; Marital Status - Married; Alcohol Use: Moderate; Drug Use: No History; Caffeine Use: Daily; Financial Concerns: No; Food, Clothing or Shelter Needs: No; Support System Lacking: No; Transportation Concerns: No Electronic Signature(s) Signed: 05/11/2022 11:12:49 AM By: Fredirick Maudlin MD FACS Signed: 05/12/2022 6:21:16 PM By: Baruch Gouty RN, BSN Entered By: Fredirick Maudlin on 05/11/2022  09:40:41 -------------------------------------------------------------------------------- SuperBill Details Patient Name: Date of Service: [name], [name] 05/11/2022 Medical Record Number: [id number] Patient Account Number: [id number] Date of Birth/Sex: Treating RN: 11-16-1986 (36 y.o. [name]: Verdi, [place] Other Clinician: Referring Provider: Treating Provider/Extender: Graciela Husbands, [name]: 331 Diagnosis Coding ICD-10 Codes Code Description I87.332 Chronic venous hypertension (idiopathic) with ulcer and inflammation of left lower extremity L97.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, stage 3 L97.821 Non-pressure chronic ulcer of other part of left lower leg limited to breakdown of skin L97.518 Non-pressure chronic ulcer of other part of right foot with other 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, 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 Non-pressure chronic ulcer of other part of left foot limited to breakdown of skin Facility Procedures CPT4 Code: [id number] Description: 11042 - DEB SUBQ TISSUE 20 SQ CM/< ICD-10 Diagnosis Description L97.821 Non-pressure chronic ulcer of other part of left lower leg limited to breakdown o L97.521 Non-pressure chronic ulcer of other part of left foot limited to breakdown of  ski Modifier: f skin n Quantity: 1 CPT4 Code: 80034917 Description: 91505 - DEBRIDE WOUND 1ST 20 SQ CM OR < ICD-10 Diagnosis Description L97.511 Non-pressure chronic ulcer of other part of right foot limited to breakdown of sk Modifier: in Quantity: 1 CPT4 Code: [id number] Description: 65537 - CHEM CAUT GRANULATION TISS ICD-10 Diagnosis Description L89.323 Pressure ulcer of left buttock, stage 3 Modifier: 59 Quantity: 1 Physician Procedures : CPT4 Code Description Modifier [id number] 67544 - WC PHYS LEVEL 3 - EST PT 25 ICD-10 Diagnosis Description L97.511  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, stage 3 L97.821  Non-pressure chronic ulcer of other part of left lower leg limited to breakdown of skin L97.518 Non-pressure chronic ulcer of other part of right foot with other specified severity  Quantity: 1 : [id number] 45409 - WC PHYS SUBQ TISS 20 SQ CM ICD-10 Diagnosis Description L97.821 Non-pressure chronic ulcer of other part of left lower leg limited to breakdown of skin L97.521 Non-pressure chronic ulcer of other part of left foot limited to breakdown  of skin [id number] - WC PHYS DEBR WO ANESTH 20 SQ CM 1 ICD-10 Diagnosis Description L97.511 Non-pressure chronic ulcer of other part of right foot limited to breakdown of skin Quantity: 1 : [id number] 30865 - WC PHYS CHEM CAUT GRAN TISSUE 59 1 ICD-10 Diagnosis Description L89.323 Pressure ulcer of left buttock, stage 3 Quantity: Electronic Signature(s) Signed: 05/11/2022 12:03:02 PM By: Fredirick Maudlin MD FACS Signed: 05/12/2022 6:21:16 PM By: Baruch Gouty RN, BSN Previous Signature: 05/11/2022 9:43:07 AM Version By: Fredirick Maudlin MD FACS Entered By: Baruch Gouty on 05/11/2022 11:44:14

## 2022-05-12 NOTE — Progress Notes (Signed)
[name], [name] ([id number]) Visit Report for 05/11/2022 Arrival Information Details Patient Name: Date of Service: [name], [name] 05/11/2022 8:15 [name] Medical Record Number: [id number] Patient Account Number: [id number] Date of Birth/Sex: Treating RN: [birth year] (36 y.o. [name] Primary Care [name]: [place], [place] Other Clinician: Referring [name]: Treating [name]/Extender: Cornelia Copa [name]: 30 Visit Information History Since Last Visit Added or deleted any medications: No Patient Arrived: Wheel Chair Any new allergies or adverse reactions: No Arrival Time: 08:32 Had [name] fall or experienced change in No Accompanied By: self activities of daily living that may affect Transfer Assistance: None risk of falls: Patient Identification Verified: Yes Signs or symptoms of abuse/neglect since last visito No Secondary Verification Process Completed: Yes Hospitalized since last visit: No Patient Requires Transmission-Based Precautions: No Implantable device outside of the clinic excluding No Patient Has Alerts: Yes cellular tissue based products placed in the center Patient Alerts: R ABI = 1.0 since last visit: L ABI = 1.1 Has Dressing in Place as Prescribed: Yes Has Compression in Place as Prescribed: Yes Pain Present Now: No Electronic Signature(s) Signed: 05/12/2022 6:21:16 PM By: Baruch Gouty RN, BSN Entered By: Baruch Gouty on 05/11/2022 08:35:14 -------------------------------------------------------------------------------- Encounter Discharge Information Details Patient Name: Date of Service: [name], [name] [name] 05/11/2022 8:15 [name] Medical Record Number: [id number] Patient Account Number: [id number] Date of Birth/Sex: Treating RN: [birth year] (36 y.o. [name] Primary Care [name]: [place], [place] Other Clinician: Referring [name]: Treating [name]/Extender: Cornelia Copa [name]:  [phone] Encounter Discharge Information Items Post Procedure Vitals Discharge Condition: Stable Temperature (F): 98.3 Ambulatory Status: Wheelchair Pulse (bpm): 115 Discharge Destination: Home Respiratory Rate (breaths/min): 18 Transportation: Private Auto Blood Pressure (mmHg): 125/73 Accompanied By: self Schedule Follow-up Appointment: Yes Clinical Summary of Care: Patient Declined Electronic Signature(s) Signed: 05/12/2022 6:21:16 PM By: Baruch Gouty RN, BSN Entered By: Baruch Gouty on 05/11/2022 11:43:46 -------------------------------------------------------------------------------- Lower Extremity Assessment Details Patient Name: Date of Service: [name], [name] [name] 05/11/2022 8:15 [name] Medical Record Number: [id number] Patient Account Number: [id number] Date of Birth/Sex: Treating RN: 13-Mar-1986 (36 y.o. [name] Primary Care [name]: [place], [place] Other Clinician: Referring [name]: Treating Bettie Capistran/Extender: [name], [name] [name]: 331 Edema Assessment Assessed: [Left: No] [Right: No] Edema: [Left: Yes] [Right: Yes] Calf Left: Right: Point of Measurement: 33 cm From Medial Instep 28.5 cm 34.5 cm Ankle Left: Right: Point of Measurement: 10 cm From Medial Instep 26 cm 24 cm Vascular Assessment Pulses: Dorsalis Pedis Palpable: [Left:Yes] [Right:Yes] Electronic Signature(s) Signed: 05/12/2022 6:21:16 PM By: Baruch Gouty RN, BSN Entered By: Baruch Gouty on 05/11/2022 08:42:00 -------------------------------------------------------------------------------- Multi Wound Chart Details Patient Name: Date of Service: [name], [name] [name] 05/11/2022 8:15 [name] Medical Record Number: [id number] Patient Account Number: [id number] Date of Birth/Sex: Treating RN: 1986/05/27 (36 y.o. [name] Primary Care [name]: [name], [name] Other Clinician: Referring [name]: Treating Ziyon Soltau/Extender: [name],  [name] [name]: 331 Vital Signs Height(in): 70 Pulse(bpm): 115 Weight(lbs): 216 Blood Pressure(mmHg): 125/73 Body Mass Index(BMI): 31 Temperature(F): 98.3 Respiratory Rate(breaths/min): 18 Photos: Left Ischium Right, Dorsal Foot Left, Dorsal Foot Wound Location: Gradually Appeared Gradually Appeared Gradually Appeared Wounding Event: Pressure Ulcer Trauma, Other Neuropathic Ulcer-Non Diabetic Primary Etiology: Sleep Apnea, Hypertension, Paraplegia Sleep Apnea, Hypertension, Paraplegia Sleep Apnea, Hypertension, Paraplegia Comorbid History: 03/16/2020 03/27/2021 07/11/2021 Date Acquired: 112 58 42 Weeks of Treatment: Open Open Open Wound Status: Yes No No Wound Recurrence: Yes No Yes Clustered Wound: 2  [name] [name] Clustered Quantity: 2.4x3x0.1 0.1x0.1x0.1 2.4x4.7x0.1 Measurements L x W x D (cm) 5.655 0.008 8.859 [name] (cm) : rea 0.565 0.001 0.886 Volume (cm) : -213.10% 99.60% -42.40% % Reduction in [name] rea: -212.20% 99.50% -42.40% % Reduction in Volume: Category/Stage II Full Thickness Without Exposed Full Thickness Without Exposed Classification: Support Structures Support Structures Medium Small Medium Exudate Amount: Purulent Serous Serosanguineous Exudate Type: yellow, brown, green amber red, brown Exudate Color: Yes No No Foul Odor [name] Cleansing: fter No [name] [name] Odor Anticipated Due to Product Use: Distinct, outline attached Flat and Intact Flat and Intact Wound Margin: Large (67-100%) None Present (0%) Large (67-100%) Granulation [name] mount: Pink, Friable [name] Red, Pink Granulation Quality: None Present (0%) None Present (0%) Small (1-33%) Necrotic Amount: Fat Layer (Subcutaneous Tissue): Yes Fascia: No Fat Layer (Subcutaneous Tissue): Yes Exposed Structures: Fascia: No Fat Layer (Subcutaneous Tissue): No Fascia: No Tendon: No Tendon: No Tendon: No Muscle: No Muscle: No Muscle: No Joint: No Joint: No Joint: No Bone: No Bone: No Bone:  No Medium (34-66%) Large (67-100%) Medium (34-66%) Epithelialization: [name] [name] Debridement - Selective/Open Wound Debridement: Pre-procedure Verification/Time Out [name] [name] 09:15 Taken: [name] [name] Slough Tissue Debrided: [name] [name] Skin/Epidermis Level: [name] [name] 11.28 Debridement [name] (sq cm): rea [name] [name] Curette Instrument: [name] [name] Minimum Bleeding: [name] [name] Pressure Hemostasis [name] chieved: [name] [name] Insensate Procedural Pain: [name] [name] Insensate Post Procedural Pain: [name] [name] Procedure was tolerated well Debridement Treatment Response: [name] [name] 2.4x4.7x0.1 Post Debridement Measurements L x W x D (cm) [name] [name] 0.886 Post Debridement Volume: (cm) Chemical Cauterization [name] Debridement Procedures Performed: Wound Number: [id number] [name] Photos: [name] Left, Anterior Lower Leg Right, Plantar Foot [name] Wound Location: Gradually Appeared Gradually Appeared [name] Wounding Event: Venous Leg Ulcer Lymphedema [name] Primary Etiology: Sleep Apnea, Hypertension, Paraplegia Sleep Apnea, Hypertension, Paraplegia [name] Comorbid History: 05/11/2022 05/11/2022 [name] Date Acquired: 0 0 [name] Weeks of Treatment: Open Open [name] Wound Status: No No [name] Wound Recurrence: No No [name] Clustered Wound: [name] [name] [name] Clustered Quantity: 0.5x0.4x0.1 0.7x0.2x0.1 [name] Measurements L x W x D (cm) 0.157 0.11 [name] [name] (cm) : rea 0.016 0.011 [name] Volume (cm) : 0.00% 0.00% [name] % Reduction in [name] rea: 0.00% 0.00% [name] % Reduction in Volume: Full Thickness Without Exposed Full Thickness Without Exposed [name] Classification: Support Structures Support Structures Medium Small [name] Exudate Amount: Serosanguineous Serous [name] Exudate Type: red, brown amber [name] Exudate Color: No No [name] Foul Odor [name] Cleansing: fter [name] [name] [name] Odor Anticipated Due to Product Use: Flat and Intact Flat and Intact [name] Wound Margin: Medium (34-66%) Large (67-100%) [name] Granulation [name] mount: Red, Friable Red [name] Granulation Quality: Medium (34-66%) None  Present (0%) [name] Necrotic Amount: Fat Layer (Subcutaneous Tissue): Yes Fat Layer (Subcutaneous Tissue): Yes [name] Exposed Structures: Fascia: No Fascia: No Tendon: No Tendon: No Muscle: No Muscle: No Joint: No Joint: No Bone: No Bone: No Medium (34-66%) Small (1-33%) [name] Epithelialization: Debridement - Excisional Debridement - Selective/Open Wound [name] Debridement: Pre-procedure Verification/Time Out 09:15 09:15 [name] Taken: Subcutaneous, [facility] [name] Tissue Debrided: Skin/Subcutaneous Tissue Non-Viable Tissue [name] Level: 0.2 0.14 [name] Debridement [name] (sq cm): rea Curette Curette [name] Instrument: Minimum Minimum [name] Bleeding: Pressure Pressure [name] Hemostasis [name] chieved: Insensate Insensate [name] Procedural Pain: Insensate Insensate [name] Post Procedural Pain: Procedure was tolerated well Procedure was tolerated well [name] Debridement Treatment Response: 0.5x0.4x0.1 0.7x0.2x0.1 [name] Post Debridement Measurements L x W x D (cm) 0.016 0.011 [name] Post Debridement Volume: (cm) Debridement Debridement [name] Procedures  Performed: Treatment Notes Electronic Signature(s) Signed: 05/11/2022 9:36:58 AM By: Fredirick Maudlin MD FACS Signed: 05/12/2022 6:21:16 PM By: Baruch Gouty RN, BSN Entered By: Fredirick Maudlin on 05/11/2022 09:36:58 -------------------------------------------------------------------------------- Multi-Disciplinary Care Plan Details Patient Name: Date of Service: [name], [name] [name] 05/11/2022 8:15 [name] Medical Record Number: [id number] Patient Account Number: [id number] Date of Birth/Sex: Treating RN: 09-Jun-1986 (36 y.o. [name] Primary Care Fernando Torry: [name], [name] Other Clinician: Referring Randy Castrejon: Treating Kristalynn Coddington/Extender: Cornelia Copa [name]: [place] reviewed with physician Active Inactive Wound/Skin Impairment Nursing Diagnoses: Impaired tissue integrity Knowledge deficit related to  ulceration/compromised skin integrity Goals: Patient/caregiver will verbalize understanding of skin care regimen Date Initiated: 01/05/2016 Target Resolution Date: 05/28/2022 Goal Status: Active Ulcer/skin breakdown will have [name] volume reduction of 30% by week 4 Date Initiated: 01/05/2016 Date Inactivated: 12/22/2017 Target Resolution Date: 01/19/2018 Unmet Reason: complex wounds, Goal Status: Unmet infection Interventions: Assess patient/caregiver ability to obtain necessary supplies Assess ulceration(s) every visit Provide education on ulcer and skin care Notes: 02/02/21: Complex Care, ongoing. Electronic Signature(s) Signed: 05/12/2022 6:21:16 PM By: Baruch Gouty RN, BSN Entered By: Baruch Gouty on 05/11/2022 09:07:10 -------------------------------------------------------------------------------- Pain Assessment Details Patient Name: Date of Service: [name], [name] [name] 05/11/2022 8:15 [name] Medical Record Number: [id number] Patient Account Number: [id number] Date of Birth/Sex: Treating RN: [birth year] (36 y.o. [name] Primary Care Chanc Kervin: [place], [place] Other Clinician: Referring Grayton Lobo: Treating Derris Millan/Extender: Cornelia Copa [name]: 331 Active Problems Location of Pain Severity and Description of Pain Patient Has Paino No Site Locations Rate the pain. Current Pain Level: 0 Pain Management and Medication Current Pain Management: Electronic Signature(s) Signed: 05/12/2022 6:21:16 PM By: Baruch Gouty RN, BSN Entered By: Baruch Gouty on 05/11/2022 08:40:45 -------------------------------------------------------------------------------- Patient/Caregiver Education Details Patient Name: Date of Service: [name], [name] 6/20/2023andnbsp8:15 [name] Medical Record Number: [id number] Patient Account Number: [id number] Date of Birth/Gender: Treating RN: Aug 31, 1986 (36 y.o. [name] Primary Care Physician: Janine Limbo Other Clinician: Referring Physician: Treating Physician/Extender: Cornelia Copa [name]: 331 Education Assessment Education Provided To: Patient Education Topics Provided Pressure: Methods: Explain/Verbal Responses: Reinforcements needed, State content correctly Venous: Methods: Explain/Verbal Responses: Reinforcements needed, State content correctly Wound/Skin Impairment: Methods: Explain/Verbal Responses: Reinforcements needed, State content correctly Electronic Signature(s) Signed: 05/12/2022 6:21:16 PM By: Baruch Gouty RN, BSN Entered By: Baruch Gouty on 05/11/2022 09:07:51 -------------------------------------------------------------------------------- Wound Assessment Details Patient Name: Date of Service: [name], [name] [name] 05/11/2022 8:15 [name] Medical Record Number: [id number] Patient Account Number: [id number] Date of Birth/Sex: Treating RN: July 09, 1986 (36 y.o. [name] Primary Care Cheyne Boulden: [place], [place] Other Clinician: Referring Mahayla Haddaway: Treating Justis Dupas/Extender: [name], [name] [name]: 331 Wound Status Wound Number: [id number] Primary Etiology: Pressure Ulcer Wound Location: Left Ischium Wound Status: Open Wounding Event: Gradually Appeared Comorbid History: Sleep Apnea, Hypertension, Paraplegia Date Acquired: 03/16/2020 Weeks Of Treatment: 112 Clustered Wound: Yes Photos Wound Measurements Length: (cm) 2.4 Width: (cm) 3 Depth: (cm) 0.1 Clustered Quantity: 2 Area: (cm) 5.655 Volume: (cm) 0.565 % Reduction in Area: -213.1% % Reduction in Volume: -212.2% Epithelialization: Medium (34-66%) Tunneling: No Undermining: No Wound Description Classification: Category/Stage II Wound Margin: Distinct, outline attached Exudate Amount: Medium Exudate Type: Purulent Exudate Color: yellow, brown, green Wound Bed Granulation Amount: Large (67-100%) Granulation Quality: Pink,  Friable Necrotic Amount: None Present (0%) Foul Odor After Cleansing: Yes Due to Product Use: No Slough/Fibrino No Exposed Structure Fascia Exposed: No Fat Layer (Subcutaneous Tissue)  Exposed: Yes Tendon Exposed: No Muscle Exposed: No Joint Exposed: No Bone Exposed: No Treatment Notes Wound #[id number] (Ischium) Wound Laterality: Left Cleanser Soap and Water Discharge Instruction: May shower and wash wound with dial antibacterial soap and water prior to dressing change. Peri-Wound Care Zinc Oxide Ointment 30g tube Discharge Instruction: Apply Zinc Oxide to periwound with each dressing change Topical Primary Dressing KerraCel Ag Gelling Fiber Dressing, 4x5 in (silver alginate) Discharge Instruction: Apply silver alginate to wound bed as instructed Secondary Dressing ABD Pad, 8x10 Discharge Instruction: Apply over primary dressing as directed. Woven Gauze Sponge, Non-Sterile 4x4 in Discharge Instruction: Apply over primary dressing as directed. Secured With 35M Medipore H Soft Cloth Surgical T ape, 4 x 10 (in/yd) Discharge Instruction: Secure with tape as directed. Compression Wrap Compression Stockings Add-Ons Electronic Signature(s) Signed: 05/12/2022 6:21:16 PM By: Baruch Gouty RN, BSN Entered By: Baruch Gouty on 05/11/2022 09:01:16 -------------------------------------------------------------------------------- Wound Assessment Details Patient Name: Date of Service: [name], [name] [name] 05/11/2022 8:15 [name] Medical Record Number: [id number] Patient Account Number: [id number] Date of Birth/Sex: Treating RN: 25-Jul-1986 (36 y.o. [name] Primary Care Amel Gianino: [name], [name] Other Clinician: Referring Daryel Kenneth: Treating Ennifer Harston/Extender: [name], [name] [name]: 331 Wound Status Wound Number: [id number] Primary Etiology: Trauma, Other Wound Location: Right, Dorsal Foot Wound Status: Open Wounding Event: Gradually Appeared Comorbid History:  Sleep Apnea, Hypertension, Paraplegia Date Acquired: 03/27/2021 Weeks Of Treatment: 58 Clustered Wound: No Photos Wound Measurements Length: (cm) 0.1 Width: (cm) 0.1 Depth: (cm) 0.1 Area: (cm) 0.008 Volume: (cm) 0.001 % Reduction in Area: 99.6% % Reduction in Volume: 99.5% Epithelialization: Large (67-100%) Tunneling: No Undermining: No Wound Description Classification: Full Thickness Without Exposed Support Structures Wound Margin: Flat and Intact Exudate Amount: Small Exudate Type: Serous Exudate Color: amber Foul Odor After Cleansing: No Slough/Fibrino No Wound Bed Granulation Amount: None Present (0%) Exposed Structure Necrotic Amount: None Present (0%) Fascia Exposed: No Fat Layer (Subcutaneous Tissue) Exposed: No Tendon Exposed: No Muscle Exposed: No Joint Exposed: No Bone Exposed: No Treatment Notes Wound #52 (Foot) Wound Laterality: Dorsal, Right Cleanser Soap and Water Discharge Instruction: May shower and wash wound with dial antibacterial soap and water prior to dressing change. Peri-Wound Care Triamcinolone 15 (g) Discharge Instruction: Use triamcinolone 15 (g) as directed Sween Lotion (Moisturizing lotion) Discharge Instruction: Apply Aquaphor moisturizing lotion as directed Lotrisone Discharge Instruction: Apply Lotrisone to periwound Topical Primary Dressing KerraCel Ag Gelling Fiber Dressing, 2x2 in (silver alginate) Discharge Instruction: Apply silver alginate to wound bed as instructed Secondary Dressing Woven Gauze Sponge, Non-Sterile 4x4 in Discharge Instruction: Apply over primary dressing as directed. Secured With [employer], 4.5x3.1 (in/yd) Discharge Instruction: Secure with Kerlix as directed. 35M Medipore H Soft Cloth Surgical T ape, 4 x 10 (in/yd) Discharge Instruction: Secure with tape as directed. Compression Wrap Compression Stockings Add-Ons Electronic Signature(s) Signed: 05/12/2022 6:21:16 PM By: Baruch Gouty RN,  BSN Entered By: Baruch Gouty on 05/11/2022 08:53:48 -------------------------------------------------------------------------------- Wound Assessment Details Patient Name: Date of Service: [name], [name] [name] 05/11/2022 8:15 [name] Medical Record Number: [id number] Patient Account Number: [id number] Date of Birth/Sex: Treating RN: [birth year] (36 y.o. [name] Primary Care Brady Schiller: [place], [place] Other Clinician: Referring Pratyush Ammon: Treating Jehad Bisono/Extender: [name], [name] [name]: 331 Wound Status Wound Number: 56 Primary Etiology: Neuropathic Ulcer-Non Diabetic Wound Location: Left, Dorsal Foot Wound Status: Open Wounding Event: Gradually Appeared Comorbid History: Sleep Apnea, Hypertension, Paraplegia Date Acquired: 07/11/2021 Weeks Of Treatment: 42 Clustered Wound: Yes Photos Wound Measurements  Length: (cm) 2.4 Width: (cm) 4.7 Depth: (cm) 0.1 Area: (cm) 8.859 Volume: (cm) 0.886 % Reduction in Area: -42.4% % Reduction in Volume: -42.4% Epithelialization: Medium (34-66%) Tunneling: No Undermining: No Wound Description Classification: Full Thickness Without Exposed Support Structures Wound Margin: Flat and Intact Exudate Amount: Medium Exudate Type: Serosanguineous Exudate Color: red, brown Foul Odor After Cleansing: No Slough/Fibrino Yes Wound Bed Granulation Amount: Large (67-100%) Exposed Structure Granulation Quality: Red, Pink Fascia Exposed: No Necrotic Amount: Small (1-33%) Fat Layer (Subcutaneous Tissue) Exposed: Yes Necrotic Quality: Adherent Slough Tendon Exposed: No Muscle Exposed: No Joint Exposed: No Bone Exposed: No Treatment Notes Wound #56 (Foot) Wound Laterality: Dorsal, Left Cleanser Soap and Water Discharge Instruction: May shower and wash wound with dial antibacterial soap and water prior to dressing change. Peri-Wound Care Triamcinolone 15 (g) Discharge Instruction: Use triamcinolone 15 (g) as  directed Sween Lotion (Moisturizing lotion) Discharge Instruction: Apply Aquaphor moisturizing lotion as directed Lotrisone Discharge Instruction: Apply Lotrisone to periwound Topical Primary Dressing KerraCel Ag Gelling Fiber Dressing, 2x2 in (silver alginate) Discharge Instruction: Apply silver alginate to wound bed as instructed Secondary Dressing Woven Gauze Sponge, Non-Sterile 4x4 in Discharge Instruction: Apply over primary dressing as directed. Secured With [employer], 4.5x3.1 (in/yd) Discharge Instruction: Secure with Kerlix as directed. 56M Medipore H Soft Cloth Surgical T ape, 4 x 10 (in/yd) Discharge Instruction: Secure with tape as directed. Compression Wrap Compression Stockings Add-Ons Electronic Signature(s) Signed: 05/12/2022 6:21:16 PM By: Baruch Gouty RN, BSN Entered By: Baruch Gouty on 05/11/2022 08:54:40 -------------------------------------------------------------------------------- Wound Assessment Details Patient Name: Date of Service: [name], [name] [name] 05/11/2022 8:15 [name] Medical Record Number: [id number] Patient Account Number: [id number] Date of Birth/Sex: Treating RN: 11/03/1986 (36 y.o. [name] Primary Care Ettel [name]: [name], [name] Other Clinician: Referring Dollye Glasser: Treating Ellissa Ayo/Extender: [name], [name] [name]: 331 Wound Status Wound Number: 63 Primary Etiology: Venous Leg Ulcer Wound Location: Left, Anterior Lower Leg Wound Status: Open Wounding Event: Gradually Appeared Comorbid History: Sleep Apnea, Hypertension, Paraplegia Date Acquired: 05/11/2022 Weeks Of Treatment: 0 Clustered Wound: No Photos Wound Measurements Length: (cm) 0.5 Width: (cm) 0.4 Depth: (cm) 0.1 Area: (cm) 0.157 Volume: (cm) 0.016 % Reduction in Area: 0% % Reduction in Volume: 0% Epithelialization: Medium (34-66%) Tunneling: No Undermining: No Wound Description Classification: Full Thickness Without  Exposed Support Structures Wound Margin: Flat and Intact Exudate Amount: Medium Exudate Type: Serosanguineous Exudate Color: red, brown Foul Odor After Cleansing: No Slough/Fibrino Yes Wound Bed Granulation Amount: Medium (34-66%) Exposed Structure Granulation Quality: Red, Friable Fascia Exposed: No Necrotic Amount: Medium (34-66%) Fat Layer (Subcutaneous Tissue) Exposed: Yes Necrotic Quality: Adherent Slough Tendon Exposed: No Muscle Exposed: No Joint Exposed: No Bone Exposed: No Treatment Notes Wound #63 (Lower Leg) Wound Laterality: Left, Anterior Cleanser Soap and Water Discharge Instruction: May shower and wash wound with dial antibacterial soap and water prior to dressing change. Peri-Wound Care Triamcinolone 15 (g) Discharge Instruction: Use triamcinolone 15 (g) as directed Sween Lotion (Moisturizing lotion) Discharge Instruction: Apply Aquaphor moisturizing lotion as directed Lotrisone Discharge Instruction: Apply Lotrisone to periwound Topical Primary Dressing KerraCel Ag Gelling Fiber Dressing, 2x2 in (silver alginate) Discharge Instruction: Apply silver alginate to wound bed as instructed Secondary Dressing Woven Gauze Sponge, Non-Sterile 4x4 in Discharge Instruction: Apply over primary dressing as directed. Secured With [employer], 4.5x3.1 (in/yd) Discharge Instruction: Secure with Kerlix as directed. 56M Medipore H Soft Cloth Surgical T ape, 4 x 10 (in/yd) Discharge Instruction: Secure with tape as directed. Compression Wrap Compression  Stockings Environmental education officer) Signed: 05/12/2022 6:21:16 PM By: Baruch Gouty RN, BSN Entered By: Baruch Gouty on 05/11/2022 08:55:36 -------------------------------------------------------------------------------- Wound Assessment Details Patient Name: Date of Service: Hlavacek, [name] [name] 05/11/2022 8:15 [name] Medical Record Number: [id number] Patient Account Number: [id number] Date of  Birth/Sex: Treating RN: 27-Aug-1986 (36 y.o. [name] Primary Care Naeema Patlan: [name], [name] Other Clinician: Referring Aarianna Hoadley: Treating Marshelle Bilger/Extender: [name], [name] [name]: 331 Wound Status Wound Number: 64 Primary Etiology: Lymphedema Wound Location: Right, Plantar Foot Wound Status: Open Wounding Event: Gradually Appeared Comorbid History: Sleep Apnea, Hypertension, Paraplegia Date Acquired: 05/11/2022 Weeks Of Treatment: 0 Clustered Wound: No Photos Wound Measurements Length: (cm) 0.7 Width: (cm) 0.2 Depth: (cm) 0.1 Area: (cm) 0.11 Volume: (cm) 0.011 % Reduction in Area: 0% % Reduction in Volume: 0% Epithelialization: Small (1-33%) Tunneling: No Undermining: No Wound Description Classification: Full Thickness Without Exposed Support Structures Wound Margin: Flat and Intact Exudate Amount: Small Exudate Type: Serous Exudate Color: amber Foul Odor After Cleansing: No Slough/Fibrino No Wound Bed Granulation Amount: Large (67-100%) Exposed Structure Granulation Quality: Red Fascia Exposed: No Necrotic Amount: None Present (0%) Fat Layer (Subcutaneous Tissue) Exposed: Yes Tendon Exposed: No Muscle Exposed: No Joint Exposed: No Bone Exposed: No Treatment Notes Wound #64 (Foot) Wound Laterality: Plantar, Right Cleanser Soap and Water Discharge Instruction: May shower and wash wound with dial antibacterial soap and water prior to dressing change. Peri-Wound Care Triamcinolone 15 (g) Discharge Instruction: Use triamcinolone 15 (g) as directed Sween Lotion (Moisturizing lotion) Discharge Instruction: Apply Aquaphor moisturizing lotion as directed Lotrisone Discharge Instruction: Apply Lotrisone to periwound Topical Primary Dressing KerraCel Ag Gelling Fiber Dressing, 2x2 in (silver alginate) Discharge Instruction: Apply silver alginate to wound bed as instructed Secondary Dressing Woven Gauze Sponge, Non-Sterile 4x4  in Discharge Instruction: Apply over primary dressing as directed. Secured With [employer], 4.5x3.1 (in/yd) Discharge Instruction: Secure with Kerlix as directed. 86M Medipore H Soft Cloth Surgical T ape, 4 x 10 (in/yd) Discharge Instruction: Secure with tape as directed. Compression Wrap Compression Stockings Add-Ons Electronic Signature(s) Signed: 05/12/2022 6:21:16 PM By: Baruch Gouty RN, BSN Entered By: Baruch Gouty on 05/11/2022 08:56:15 -------------------------------------------------------------------------------- [place] Details Patient Name: Date of Service: Wrench, [name] [name] 05/11/2022 8:15 [name] Medical Record Number: [id number] Patient Account Number: [id number] Date of Birth/Sex: Treating RN: 19-Jun-1986 (36 y.o. [name] Primary Care Mikey Maffett: [place], [place] Other Clinician: Referring Resa Rinks: Treating Lavaughn Haberle/Extender: [name], [name] [name]: 331 Vital Signs Time Taken: 08:35 Temperature (F): 98.3 Height (in): 70 Pulse (bpm): 115 Source: Stated Respiratory Rate (breaths/min): 18 Weight (lbs): 216 Blood Pressure (mmHg): 125/73 Source: Stated Reference Range: 80 - 120 mg / dl Body Mass Index (BMI): 31 Electronic Signature(s) Signed: 05/12/2022 6:21:16 PM By: Baruch Gouty RN, BSN Entered By: Baruch Gouty on 05/11/2022 08:37:19

## 2022-06-01 ENCOUNTER — Encounter (HOSPITAL_BASED_OUTPATIENT_CLINIC_OR_DEPARTMENT_OTHER): Payer: BC Managed Care – PPO | Attending: General Surgery | Admitting: General Surgery

## 2022-06-01 DIAGNOSIS — G8220 Paraplegia, unspecified: Secondary | ICD-10-CM | POA: Diagnosis not present

## 2022-06-01 DIAGNOSIS — I87332 Chronic venous hypertension (idiopathic) with ulcer and inflammation of left lower extremity: Secondary | ICD-10-CM | POA: Diagnosis not present

## 2022-06-01 DIAGNOSIS — L97522 Non-pressure chronic ulcer of other part of left foot with fat layer exposed: Secondary | ICD-10-CM | POA: Insufficient documentation

## 2022-06-01 DIAGNOSIS — L89323 Pressure ulcer of left buttock, stage 3: Secondary | ICD-10-CM | POA: Diagnosis not present

## 2022-06-01 DIAGNOSIS — L97311 Non-pressure chronic ulcer of right ankle limited to breakdown of skin: Secondary | ICD-10-CM | POA: Diagnosis not present

## 2022-06-01 DIAGNOSIS — L97512 Non-pressure chronic ulcer of other part of right foot with fat layer exposed: Secondary | ICD-10-CM | POA: Diagnosis not present

## 2022-06-01 NOTE — Progress Notes (Signed)
Reddy, AVANISH CERULLO ([id number]) Visit Report for 06/01/2022 Arrival Information Details Patient Name: Date of Service: Dress, [name]. 06/01/2022 8:15 A M Medical Record Number: [id number] Patient Account Number: [id number] Date of Birth/Sex: Treating RN: Mar 05, 1986 (36 y.o. [name] Primary Care Niyah Mamaril: [place], [place] Other Clinician: Referring Gladys Deckard: Treating Dell Hurtubise/Extender: Cornelia Copa [name]: 83 Visit Information History Since Last Visit Added or deleted any medications: No Patient Arrived: Wheel Chair Any new allergies or adverse reactions: No Arrival Time: 08:15 Had a fall or experienced change in No Accompanied By: self activities of daily living that may affect Transfer Assistance: None risk of falls: Patient Identification Verified: Yes Signs or symptoms of abuse/neglect since last visito No Secondary Verification Process Completed: Yes Hospitalized since last visit: No Patient Requires Transmission-Based Precautions: No Implantable device outside of the clinic excluding No Patient Has Alerts: Yes cellular tissue based products placed in the center Patient Alerts: R ABI = 1.0 since last visit: L ABI = 1.1 Has Dressing in Place as Prescribed: Yes Has Compression in Place as Prescribed: Yes Pain Present Now: No Electronic Signature(s) Signed: 06/01/2022 6:01:19 PM By: Baruch Gouty RN, BSN Entered By: Baruch Gouty on 06/01/2022 17:25:41 -------------------------------------------------------------------------------- Encounter Discharge Information Details Patient Name: Date of Service: Kabler, [name] 06/01/2022 8:15 A M Medical Record Number: [id number] Patient Account Number: [id number] Date of Birth/Sex: Treating RN: July 17, 1986 (36 y.o. [name] Primary Care Nunzio Banet: [place], [place] Other Clinician: Referring Aqil Goetting: Treating Kaylor Maiers/Extender: Cornelia Copa [name]:  334 Encounter Discharge Information Items Post Procedure Vitals Discharge Condition: Stable Temperature (F): 98.8 Ambulatory Status: Wheelchair Pulse (bpm): 117 Discharge Destination: Home Respiratory Rate (breaths/min): 18 Transportation: Private Auto Blood Pressure (mmHg): 145/72 Accompanied By: self Schedule Follow-up Appointment: Yes Clinical Summary of Care: Patient Declined Electronic Signature(s) Signed: 06/01/2022 6:01:19 PM By: Baruch Gouty RN, BSN Entered By: Baruch Gouty on 06/01/2022 17:46:02 -------------------------------------------------------------------------------- Lower Extremity Assessment Details Patient Name: Date of Service: [place]bar, [name] 06/01/2022 8:15 A M Medical Record Number: [id number] Patient Account Number: [id number] Date of Birth/Sex: Treating RN: Jan 06, 1986 (36 y.o. [name] Primary Care Croy Drumwright: [place], [place] Other Clinician: Referring Harry Shuck: Treating Levone Otten/Extender: Graciela Husbands, [name]: 334 Edema Assessment Assessed: [Left: No] [Right: No] Edema: [Left: Yes] [Right: Yes] Calf Left: Right: Point of Measurement: 33 cm From Medial Instep 28.5 cm 34.5 cm Ankle Left: Right: Point of Measurement: 10 cm From Medial Instep 26 cm 24 cm Vascular Assessment Pulses: Dorsalis Pedis Palpable: [Left:Yes] [Right:Yes] Electronic Signature(s) Signed: 06/01/2022 6:01:19 PM By: Baruch Gouty RN, BSN Entered By: Baruch Gouty on 06/01/2022 17:26:41 -------------------------------------------------------------------------------- Multi Wound Chart Details Patient Name: Date of Service: Walter, [name] 06/01/2022 8:15 A M Medical Record Number: [id number] Patient Account Number: [id number] Date of Birth/Sex: Treating RN: 1986/03/20 (36 y.o. [name] Primary Care Karleen Seebeck: [name], [name] Other Clinician: Referring Alyana Kreiter: Treating Piedad Standiford/Extender: Graciela Husbands,  [name]: 334 Vital Signs Height(in): 70 Pulse(bpm): 117 Weight(lbs): 216 Blood Pressure(mmHg): 145/72 Body Mass Index(BMI): 31 Temperature(F): 98.8 Respiratory Rate(breaths/min): 18 Photos: [41R:No Photos Left Ischium] [52:No Photos Right, Dorsal Foot] [56:No Photos Left, Dorsal Foot] Wound Location: [41R:Gradually Appeared] [52:Gradually Appeared] [56:Gradually Appeared] Wounding Event: [41R:Pressure Ulcer] [52:Trauma, Other] [56:Neuropathic Ulcer-Non Diabetic] Primary Etiology: [41R:Sleep Apnea, Hypertension, Paraplegia Sleep Apnea, Hypertension, Paraplegia Sleep Apnea, Hypertension, Paraplegia] Comorbid History: [41R:03/16/2020] [52:03/27/2021] [56:07/11/2021] Date Acquired: [41R:115] [52:61] [56:45] Weeks of Treatment: [41R:Open] [52:Open] [56:Open] Wound Status: [41R:Yes] [52:No] [56:No] Wound Recurrence: [  41R:Yes] [52:No] [56:Yes] Clustered Wound: [41R:3] [52:N/A] [56:N/A] Clustered Quantity: [41R:2.3x4.7x0.1] [52:1.2x0.1x0.1] [56:2.9x1.2x0.1] Measurements L x W x D (cm) [41R:8.49] [52:0.094] [56:2.733] A (cm) : rea [41R:0.849] [52:0.009] [56:0.273] Volume (cm) : [41R:-370.10%] [52:95.00%] [56:56.10%] % Reduction in Area: [41R:-369.10%] [52:95.20%] [56:56.10%] % Reduction in Volume: [41R:Category/Stage II] [52:Full Thickness Without Exposed] [56:Full Thickness Without Exposed] Classification: [41R:Medium] [52:Support Structures Small] [56:Support Structures Small] Exudate A mount: [41R:Serosanguineous] [52:Serous] [56:Serous] Exudate Type: [41R:red, brown] [52:amber] [56:amber] Exudate Color: [41R:Distinct, outline attached] [52:Flat and Intact] [56:Flat and Intact] Wound Margin: [41R:Medium (34-66%)] [52:Small (1-33%)] [56:Small (1-33%)] Granulation A mount: [41R:Red, Pink] [52:Red] [56:Red, Pink] Granulation Quality: [41R:Medium (34-66%)] [52:None Present (0%)] [56:Small (1-33%)] Necrotic A mount: [41R:Fat Layer (Subcutaneous Tissue): Yes Fat Layer  (Subcutaneous Tissue): Yes Fat Layer (Subcutaneous Tissue): Yes] Exposed Structures: [41R:Fascia: No Tendon: No Muscle: No Joint: No Bone: No Small (1-33%)] [52:Fascia: No Tendon: No Muscle: No Joint: No Bone: No Large (67-100%)] [56:Fascia: No Tendon: No Muscle: No Joint: No Bone: No Large (67-100%)] Epithelialization: [41R:Debridement - Selective/Open Wound N/A] [56:N/A] Debridement: Pre-procedure Verification/Time Out 08:50 [52:N/A] [56:N/A] Taken: [41R:Slough] [52:N/A] [56:N/A] Tissue Debrided: [41R:Non-Viable Tissue] [52:N/A] [56:N/A] Level: [41R:6.9] [52:N/A] [56:N/A] Debridement A (sq cm): [41R:rea Curette] [52:N/A] [56:N/A] Instrument: [41R:Minimum] [52:N/A] [56:N/A] Bleeding: [41R:Pressure] [52:N/A] [56:N/A] Hemostasis A chieved: [41R:Insensate] [52:N/A] [56:N/A] Procedural Pain: [41R:Insensate] [52:N/A] [56:N/A] Post Procedural Pain: [41R:Procedure was tolerated well] [52:N/A] [56:N/A] Debridement Treatment Response: [41R:2.3x4.7x0.1] [52:N/A] [56:N/A] Post Debridement Measurements L x W x D (cm) [41R:0.849] [52:N/A] [56:N/A] Post Debridement Volume: (cm) [41R:Category/Stage II] [52:N/A] [56:N/A] Post Debridement Stage: [41R:Debridement] [52:N/A] [56:N/A] Wound Number: [id number] Photos: No Photos No Photos No Photos Left, Anterior Lower Leg Right, Plantar Foot Left Upper Leg Wound Location: Gradually Appeared Gradually Appeared Pressure Injury Wounding Event: Venous Leg Ulcer Lymphedema Pressure Ulcer Primary Etiology: Sleep Apnea, Hypertension, Paraplegia Sleep Apnea, Hypertension, Paraplegia Sleep Apnea, Hypertension, Paraplegia Comorbid History: 05/11/2022 05/11/2022 05/24/2022 Date Acquired: 3 3 0 Weeks of Treatment: Healed - Epithelialized Open Open Wound Status: No No No Wound Recurrence: No No No Clustered Wound: N/A N/A N/A Clustered Quantity: 0x0x0 0.1x0.1x0.1 4x2x0.1 Measurements L x W x D (cm) 0 0.008 6.283 A (cm) : rea 0 0.001 0.628 Volume (cm)  : 100.00% 92.70% N/A % Reduction in A rea: 100.00% 90.90% N/A % Reduction in Volume: Full Thickness Without Exposed Full Thickness Without Exposed Category/Stage III Classification: Support Structures Support Structures None Present None Present Medium Exudate A mount: N/A N/A Serosanguineous Exudate Type: N/A N/A red, brown Exudate Color: N/A Flat and Intact Flat and Intact Wound Margin: None Present (0%) None Present (0%) Medium (34-66%) Granulation A mount: N/A N/A Pink, Pale Granulation Quality: None Present (0%) None Present (0%) Medium (34-66%) Necrotic A mount: Fascia: No Fascia: No Fat Layer (Subcutaneous Tissue): Yes Exposed Structures: Fat Layer (Subcutaneous Tissue): No Fat Layer (Subcutaneous Tissue): No Fascia: No Tendon: No Tendon: No Tendon: No Muscle: No Muscle: No Muscle: No Joint: No Joint: No Joint: No Bone: No Bone: No Bone: No Large (67-100%) Large (67-100%) None Epithelialization: N/A N/A Debridement - Selective/Open Wound Debridement: Pre-procedure Verification/Time Out N/A N/A 08:50 Taken: N/A N/A Slough Tissue Debrided: N/A N/A Non-Viable Tissue Level: N/A N/A 6 Debridement A (sq cm): rea N/A N/A Curette Instrument: N/A N/A Minimum Bleeding: N/A N/A Pressure Hemostasis A chieved: N/A N/A Insensate Procedural Pain: N/A N/A Insensate Post Procedural Pain: N/A N/A Procedure was tolerated well Debridement Treatment Response: N/A N/A 4x2x0.1 Post Debridement Measurements L x W x D (cm) N/A N/A 0.628 Post Debridement Volume: (  cm) N/A N/A Category/Stage III Post Debridement Stage: N/A N/A Debridement Procedures Performed: Treatment Notes Electronic Signature(s) Signed: 06/01/2022 6:01:19 PM By: Baruch Gouty RN, BSN Previous Signature: 06/01/2022 10:34:03 AM Version By: Fredirick Maudlin MD FACS Entered By: Baruch Gouty on 06/01/2022  17:40:36 -------------------------------------------------------------------------------- Multi-Disciplinary Care Plan Details Patient Name: Date of Service: [place]rolak, [name] 06/01/2022 8:15 A M Medical Record Number: [id number] Patient Account Number: [id number] Date of Birth/Sex: Treating RN: 1986/08/10 (36 y.o. [name] Primary Care Ardian Haberland: [name], [name] Other Clinician: Referring Weiland Tomich: Treating Sherrilynn Gudgel/Extender: Cornelia Copa [name]: [place] reviewed with physician Active Inactive Pressure Nursing Diagnoses: Knowledge deficit related to management of pressures ulcers Potential for impaired tissue integrity related to pressure, friction, moisture, and shear Goals: Patient will remain free from development of additional pressure ulcers Date Initiated: 06/21/2018 Date Inactivated: 02/12/2019 Target Resolution Date: 02/16/2019 Unmet Reason: pressure ulcer Goal Status: Unmet reopened on left ischium Patient/caregiver will verbalize risk factors for pressure ulcer development Date Initiated: 06/21/2018 Date Inactivated: 05/28/2019 Target Resolution Date: 06/01/2019 Goal Status: Met Patient/caregiver will verbalize understanding of pressure ulcer management Date Initiated: 06/01/2022 Target Resolution Date: 06/29/2022 Goal Status: Active Interventions: Assess: immobility, friction, shearing, incontinence upon admission and as needed Assess offloading mechanisms upon admission and as needed Assess potential for pressure ulcer upon admission and as needed Treatment Activities: Pressure reduction/relief device ordered : 12/05/2017 Notes: Wound/Skin Impairment Nursing Diagnoses: Impaired tissue integrity Knowledge deficit related to ulceration/compromised skin integrity Goals: Patient/caregiver will verbalize understanding of skin care regimen Date Initiated: 06/01/2022 Target Resolution Date: 06/29/2022 Goal Status:  Active Ulcer/skin breakdown will have a volume reduction of 30% by week 4 Date Initiated: 06/01/2022 Target Resolution Date: 06/29/2022 Goal Status: Active Interventions: Assess patient/caregiver ability to obtain necessary supplies Assess ulceration(s) every visit Provide education on ulcer and skin care Notes: 02/02/21: Complex Care, ongoing. Electronic Signature(s) Signed: 06/01/2022 6:01:19 PM By: Baruch Gouty RN, BSN Entered By: Baruch Gouty on 06/01/2022 17:36:41 -------------------------------------------------------------------------------- Pain Assessment Details Patient Name: Date of Service: Reifsteck, [name] 06/01/2022 8:15 A M Medical Record Number: [id number] Patient Account Number: [id number] Date of Birth/Sex: Treating RN: 01-23-1986 (36 y.o. [name] Primary Care Nasser Ku: [place], [place] Other Clinician: Referring Bexley Laubach: Treating Daryan Buell/Extender: Cornelia Copa [name]: 334 Active Problems Location of Pain Severity and Description of Pain Patient Has Paino No Site Locations Rate the pain. Current Pain Level: 0 Pain Management and Medication Current Pain Management: Electronic Signature(s) Signed: 06/01/2022 6:01:19 PM By: Baruch Gouty RN, BSN Entered By: Baruch Gouty on 06/01/2022 17:26:27 -------------------------------------------------------------------------------- Patient/Caregiver Education Details Patient Name: Date of Service: Carillo, [name] 7/11/2023andnbsp8:15 A M Medical Record Number: [id number] Patient Account Number: [id number] Date of Birth/Gender: Treating RN: 04-19-1986 (36 y.o. [name] Primary Care Physician: Janine Limbo Other Clinician: Referring Physician: Treating Physician/Extender: Cornelia Copa [name]: [place] Education Assessment Education Provided To: Patient Education Topics Provided Pressure: Methods: Explain/Verbal Responses:  Reinforcements needed, State content correctly Wound/Skin Impairment: Methods: Explain/Verbal Responses: Reinforcements needed, State content correctly Electronic Signature(s) Signed: 06/01/2022 6:01:19 PM By: Baruch Gouty RN, BSN Entered By: Baruch Gouty on 06/01/2022 17:37:06 -------------------------------------------------------------------------------- Wound Assessment Details Patient Name: Date of Service: [place], [name] 06/01/2022 8:15 A M Medical Record Number: [id number] Patient Account Number: [id number] Date of Birth/Sex: Treating RN: 20-Apr-1986 (36 y.o. [name] Primary Care Dorlis Judice: [place], [place] Other Clinician: Referring Kayle Passarelli: Treating Carianna Lague/Extender: Graciela Husbands, [name]: 334 Wound  Status Wound Number: [id number] Primary Etiology: Pressure Ulcer Wound Location: Left Ischium Wound Status: Open Wounding Event: Gradually Appeared Comorbid History: Sleep Apnea, Hypertension, Paraplegia Date Acquired: 03/16/2020 Weeks Of Treatment: 115 Clustered Wound: Yes Wound Measurements Length: (cm) 2.3 Width: (cm) 4.7 Depth: (cm) 0.1 Clustered Quantity: 3 Area: (cm) 8.49 Volume: (cm) 0.849 % Reduction in Area: -370.1% % Reduction in Volume: -369.1% Epithelialization: Small (1-33%) Tunneling: No Undermining: No Wound Description Classification: Category/Stage II Wound Margin: Distinct, outline attached Exudate Amount: Medium Exudate Type: Serosanguineous Exudate Color: red, brown Foul Odor After Cleansing: No Slough/Fibrino Yes Wound Bed Granulation Amount: Medium (34-66%) Exposed Structure Granulation Quality: Red, Pink Fascia Exposed: No Necrotic Amount: Medium (34-66%) Fat Layer (Subcutaneous Tissue) Exposed: Yes Necrotic Quality: Adherent Slough Tendon Exposed: No Muscle Exposed: No Joint Exposed: No Bone Exposed: No Treatment Notes Wound #[id number] (Ischium) Wound Laterality: Left Cleanser Soap and  Water Discharge Instruction: May shower and wash wound with dial antibacterial soap and water prior to dressing change. Peri-Wound Care Zinc Oxide Ointment 30g tube Discharge Instruction: Apply Zinc Oxide to periwound with each dressing change Topical Primary Dressing KerraCel Ag Gelling Fiber Dressing, 4x5 in (silver alginate) Discharge Instruction: Apply silver alginate to wound bed as instructed Secondary Dressing ALLEVYN Gentle Border, 4x4 (in/in) Discharge Instruction: Apply over primary dressing as directed. Secured With Compression Wrap Compression Stockings Environmental education officer) Signed: 06/01/2022 6:01:19 PM By: Baruch Gouty RN, BSN Entered By: Baruch Gouty on 06/01/2022 17:28:54 -------------------------------------------------------------------------------- Wound Assessment Details Patient Name: Date of Service: [place], [name] 06/01/2022 8:15 A M Medical Record Number: [id number] Patient Account Number: [id number] Date of Birth/Sex: Treating RN: 05/14/1986 (36 y.o. [name] Primary Care Elad Macphail: [name], [name] Other Clinician: Referring Eytan Carrigan: Treating Tam Savoia/Extender: Graciela Husbands, [name]: 334 Wound Status Wound Number: 52 Primary Etiology: Trauma, Other Wound Location: Right, Dorsal Foot Wound Status: Open Wounding Event: Gradually Appeared Comorbid History: Sleep Apnea, Hypertension, Paraplegia Date Acquired: 03/27/2021 Weeks Of Treatment: 61 Clustered Wound: No Wound Measurements Length: (cm) 1.2 Width: (cm) 0.1 Depth: (cm) 0.1 Area: (cm) 0.094 Volume: (cm) 0.009 % Reduction in Area: 95% % Reduction in Volume: 95.2% Epithelialization: Large (67-100%) Tunneling: No Undermining: No Wound Description Classification: Full Thickness Without Exposed Support Structures Wound Margin: Flat and Intact Exudate Amount: Small Exudate Type: Serous Exudate Color: amber Foul Odor After Cleansing:  No Slough/Fibrino No Wound Bed Granulation Amount: Small (1-33%) Exposed Structure Granulation Quality: Red Fascia Exposed: No Necrotic Amount: None Present (0%) Fat Layer (Subcutaneous Tissue) Exposed: Yes Tendon Exposed: No Muscle Exposed: No Joint Exposed: No Bone Exposed: No Treatment Notes Wound #52 (Foot) Wound Laterality: Dorsal, Right Cleanser Soap and Water Discharge Instruction: May shower and wash wound with dial antibacterial soap and water prior to dressing change. Peri-Wound Care Triamcinolone 15 (g) Discharge Instruction: Use triamcinolone 15 (g) as directed Sween Lotion (Moisturizing lotion) Discharge Instruction: Apply Aquaphor moisturizing lotion as directed Lotrisone Discharge Instruction: Apply Lotrisone to periwound Topical Primary Dressing KerraCel Ag Gelling Fiber Dressing, 2x2 in (silver alginate) Discharge Instruction: Apply silver alginate to wound bed as instructed Secondary Dressing Woven Gauze Sponge, Non-Sterile 4x4 in Discharge Instruction: Apply over primary dressing as directed. Secured With [employer], 4.5x3.1 (in/yd) Discharge Instruction: Secure with Kerlix as directed. 65M Medipore H Soft Cloth Surgical T ape, 4 x 10 (in/yd) Discharge Instruction: Secure with tape as directed. Compression Wrap Compression Stockings Add-Ons Electronic Signature(s) Signed: 06/01/2022 6:01:19 PM By: Baruch Gouty RN, BSN Entered By: Baruch Gouty on 06/01/2022 17:29:28 --------------------------------------------------------------------------------  Wound Assessment Details Patient Name: Date of Service: Seyler, [name] 06/01/2022 8:15 A M Medical Record Number: [id number] Patient Account Number: [id number] Date of Birth/Sex: Treating RN: 1986-10-12 (36 y.o. [name] Primary Care Manisha Cancel: [name], [name] Other Clinician: Referring Jhada Risk: Treating Scarleth Brame/Extender: Graciela Husbands, [name]: 334 Wound  Status Wound Number: 56 Primary Etiology: Neuropathic Ulcer-Non Diabetic Wound Location: Left, Dorsal Foot Wound Status: Open Wounding Event: Gradually Appeared Comorbid History: Sleep Apnea, Hypertension, Paraplegia Date Acquired: 07/11/2021 Weeks Of Treatment: 45 Clustered Wound: Yes Wound Measurements Length: (cm) 2.9 Width: (cm) 1.2 Depth: (cm) 0.1 Area: (cm) 2.733 Volume: (cm) 0.273 % Reduction in Area: 56.1% % Reduction in Volume: 56.1% Epithelialization: Large (67-100%) Tunneling: No Undermining: No Wound Description Classification: Full Thickness Without Exposed Support Structures Wound Margin: Flat and Intact Exudate Amount: Small Exudate Type: Serous Exudate Color: amber Foul Odor After Cleansing: No Slough/Fibrino Yes Wound Bed Granulation Amount: Small (1-33%) Exposed Structure Granulation Quality: Red, Pink Fascia Exposed: No Necrotic Amount: Small (1-33%) Fat Layer (Subcutaneous Tissue) Exposed: Yes Necrotic Quality: Adherent Slough Tendon Exposed: No Muscle Exposed: No Joint Exposed: No Bone Exposed: No Treatment Notes Wound #56 (Foot) Wound Laterality: Dorsal, Left Cleanser Soap and Water Discharge Instruction: May shower and wash wound with dial antibacterial soap and water prior to dressing change. Peri-Wound Care Triamcinolone 15 (g) Discharge Instruction: Use triamcinolone 15 (g) as directed Sween Lotion (Moisturizing lotion) Discharge Instruction: Apply Aquaphor moisturizing lotion as directed Lotrisone Discharge Instruction: Apply Lotrisone to periwound Topical Primary Dressing KerraCel Ag Gelling Fiber Dressing, 2x2 in (silver alginate) Discharge Instruction: Apply silver alginate to wound bed as instructed Secondary Dressing Woven Gauze Sponge, Non-Sterile 4x4 in Discharge Instruction: Apply over primary dressing as directed. Secured With [employer], 4.5x3.1 (in/yd) Discharge Instruction: Secure with Kerlix as  directed. 47M Medipore H Soft Cloth Surgical T ape, 4 x 10 (in/yd) Discharge Instruction: Secure with tape as directed. Compression Wrap Compression Stockings Add-Ons Electronic Signature(s) Signed: 06/01/2022 6:01:19 PM By: Baruch Gouty RN, BSN Entered By: Baruch Gouty on 06/01/2022 17:30:05 -------------------------------------------------------------------------------- Wound Assessment Details Patient Name: Date of Service: Corredor, [name] 06/01/2022 8:15 A M Medical Record Number: [id number] Patient Account Number: [id number] Date of Birth/Sex: Treating RN: [birth year] (36 y.o. [name] Primary Care Razia Screws: [name], [name] Other Clinician: Referring Kierstyn Baranowski: Treating Dao Mearns/Extender: Graciela Husbands, [name]: 334 Wound Status Wound Number: 63 Primary Etiology: Venous Leg Ulcer Wound Location: Left, Anterior Lower Leg Wound Status: Healed - Epithelialized Wounding Event: Gradually Appeared Comorbid History: Sleep Apnea, Hypertension, Paraplegia Date Acquired: 05/11/2022 Weeks Of Treatment: 3 Clustered Wound: No Wound Measurements Length: (cm) Width: (cm) Depth: (cm) Area: (cm) Volume: (cm) 0 % Reduction in Area: 100% 0 % Reduction in Volume: 100% 0 Epithelialization: Large (67-100%) 0 Tunneling: No 0 Undermining: No Wound Description Classification: Full Thickness Without Exposed Support Structures Exudate Amount: None Present Foul Odor After Cleansing: No Slough/Fibrino No Wound Bed Granulation Amount: None Present (0%) Exposed Structure Necrotic Amount: None Present (0%) Fascia Exposed: No Fat Layer (Subcutaneous Tissue) Exposed: No Tendon Exposed: No Muscle Exposed: No Joint Exposed: No Bone Exposed: No Electronic Signature(s) Signed: 06/01/2022 6:01:19 PM By: Baruch Gouty RN, BSN Entered By: Baruch Gouty on 06/01/2022  17:30:45 -------------------------------------------------------------------------------- Wound Assessment Details Patient Name: Date of Service: Shieh, [name] 06/01/2022 8:15 A M Medical Record Number: [id number] Patient Account Number: [id number] Date of Birth/Sex: Treating RN: 1986/08/04 (36 y.o. [name] Primary Care Akiem Urieta: [place], [place]  Other Clinician: Referring Danity Schmelzer: Treating Lechelle Wrigley/Extender: Graciela Husbands, [name]: 334 Wound Status Wound Number: 64 Primary Etiology: Lymphedema Wound Location: Right, Plantar Foot Wound Status: Open Wounding Event: Gradually Appeared Comorbid History: Sleep Apnea, Hypertension, Paraplegia Date Acquired: 05/11/2022 Weeks Of Treatment: 3 Clustered Wound: No Wound Measurements Length: (cm) 0.1 Width: (cm) 0.1 Depth: (cm) 0.1 Area: (cm) 0.008 Volume: (cm) 0.001 % Reduction in Area: 92.7% % Reduction in Volume: 90.9% Epithelialization: Large (67-100%) Tunneling: No Undermining: No Wound Description Classification: Full Thickness Without Exposed Support Structures Wound Margin: Flat and Intact Exudate Amount: None Present Foul Odor After Cleansing: No Slough/Fibrino No Wound Bed Granulation Amount: None Present (0%) Exposed Structure Necrotic Amount: None Present (0%) Fascia Exposed: No Fat Layer (Subcutaneous Tissue) Exposed: No Tendon Exposed: No Muscle Exposed: No Joint Exposed: No Bone Exposed: No Treatment Notes Wound #64 (Foot) Wound Laterality: Plantar, Right Cleanser Soap and Water Discharge Instruction: May shower and wash wound with dial antibacterial soap and water prior to dressing change. Peri-Wound Care Triamcinolone 15 (g) Discharge Instruction: Use triamcinolone 15 (g) as directed Sween Lotion (Moisturizing lotion) Discharge Instruction: Apply Aquaphor moisturizing lotion as directed Lotrisone Discharge Instruction: Apply Lotrisone to  periwound Topical Primary Dressing KerraCel Ag Gelling Fiber Dressing, 2x2 in (silver alginate) Discharge Instruction: Apply silver alginate to wound bed as instructed Secondary Dressing Woven Gauze Sponge, Non-Sterile 4x4 in Discharge Instruction: Apply over primary dressing as directed. Secured With [employer], 4.5x3.1 (in/yd) Discharge Instruction: Secure with Kerlix as directed. 76M Medipore H Soft Cloth Surgical T ape, 4 x 10 (in/yd) Discharge Instruction: Secure with tape as directed. Compression Wrap Compression Stockings Add-Ons Electronic Signature(s) Signed: 06/01/2022 6:01:19 PM By: Baruch Gouty RN, BSN Entered By: Baruch Gouty on 06/01/2022 17:31:14 -------------------------------------------------------------------------------- Wound Assessment Details Patient Name: Date of Service: Jansson, [name] 06/01/2022 8:15 A M Medical Record Number: [id number] Patient Account Number: [id number] Date of Birth/Sex: Treating RN: 10/05/1986 (36 y.o. [name] Primary Care Miquela Costabile: [name], [name] Other Clinician: Referring Ashantae Pangallo: Treating Tynetta Bachmann/Extender: Graciela Husbands, [name]: 334 Wound Status Wound Number: 65 Primary Etiology: Pressure Ulcer Wound Location: Left Upper Leg Wound Status: Open Wounding Event: Pressure Injury Comorbid History: Sleep Apnea, Hypertension, Paraplegia Date Acquired: 05/24/2022 Weeks Of Treatment: 0 Clustered Wound: No Wound Measurements Length: (cm) 4 Width: (cm) 2 Depth: (cm) 0.1 Area: (cm) 6.283 Volume: (cm) 0.628 % Reduction in Area: % Reduction in Volume: Epithelialization: None Tunneling: No Undermining: No Wound Description Classification: Category/Stage III Wound Margin: Flat and Intact Exudate Amount: Medium Exudate Type: Serosanguineous Exudate Color: red, brown Foul Odor After Cleansing: No Slough/Fibrino Yes Wound Bed Granulation Amount: Medium (34-66%) Exposed  Structure Granulation Quality: Pink, Pale Fascia Exposed: No Necrotic Amount: Medium (34-66%) Fat Layer (Subcutaneous Tissue) Exposed: Yes Necrotic Quality: Adherent Slough Tendon Exposed: No Muscle Exposed: No Joint Exposed: No Bone Exposed: No Treatment Notes Wound #65 (Upper Leg) Wound Laterality: Left Cleanser Soap and Water Discharge Instruction: May shower and wash wound with dial antibacterial soap and water prior to dressing change. Peri-Wound Care Zinc Oxide Ointment 30g tube Discharge Instruction: Apply Zinc Oxide to periwound with each dressing change Topical Primary Dressing KerraCel Ag Gelling Fiber Dressing, 4x5 in (silver alginate) Discharge Instruction: Apply silver alginate to wound bed as instructed Secondary Dressing ALLEVYN Gentle Border, 4x4 (in/in) Discharge Instruction: Apply over primary dressing as directed. Secured With Compression Wrap Compression Stockings Environmental education officer) Signed: 06/01/2022 6:01:19 PM By: Baruch Gouty RN, BSN Entered By: Baruch Gouty  on 06/01/2022 17:33:07 -------------------------------------------------------------------------------- Vitals Details Patient Name: Date of Service: Tibbetts, [name] 06/01/2022 8:15 A M Medical Record Number: [id number] Patient Account Number: [id number] Date of Birth/Sex: Treating RN: 07-29-1986 (36 y.o. [name] Primary Care Gustavo Meditz: [place], [place] Other Clinician: Referring Reiana Poteet: Treating Elonzo Sopp/Extender: Graciela Husbands, [name]: 334 Vital Signs Time Taken: 08:20 Temperature (F): 98.8 Height (in): 70 Pulse (bpm): 117 Weight (lbs): 216 Respiratory Rate (breaths/min): 18 Body Mass Index (BMI): 31 Blood Pressure (mmHg): 145/72 Reference Range: 80 - 120 mg / dl Electronic Signature(s) Signed: 06/01/2022 6:01:19 PM By: Baruch Gouty RN, BSN Entered By: Baruch Gouty on 06/01/2022 17:26:13

## 2022-06-02 DIAGNOSIS — L89224 Pressure ulcer of left hip, stage 4: Secondary | ICD-10-CM | POA: Diagnosis not present

## 2022-06-02 NOTE — Progress Notes (Signed)
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 wounds between his right first and second toe which I thought might of been tinea pedis he is not making as much progress very dry scaly skin over the area. Also the area at the base of the left first and second toe in [name] similar condition 05/12/17; continued gradual improvement in the refractory left lateral venous insufficiency wound on the left. Dimension smaller. Surface still requiring debridement using Hydrofera Blue 05/19/17; continued gradual improvement in the refractory left lateral venous ulceration. Careful inspection of the wound bed underlying rumination suggested some degree of epithelialization over the surface no debridement indicated. Continue Hydrofera Blue difficult areas between his toes first and third on the left than first and second on the right. I'm going to change to silver alginate from silver collagen. Continue ketoconazole as I suspect underlying tinea pedis 05/26/17; left lateral leg venous insufficiency wound. We've been using Hydrofera Blue. I believe that there is expanding epithelialization over the surface of the wound albeit not coming from the wound circumference. This is [name] bit of an odd situation in which the epithelialization seems to be coming from the surface of the wound rather than in the exact circumference. There is still small open areas mostly along the lateral margin of the wound. He has unchanged areas between the left first and second and the right  first second toes which I been treating for tenia pedis 06/02/17; left lateral leg venous insufficiency wound. We have been using Hydrofera Blue. Somewhat smaller from the wound circumference. The surface of the wound remains [name] bit on it almost epithelialized sedation in appearance. I use an open curette today debridement in the surface  of all of this especially the edges Small open wounds remaining on the dorsal right first and second toe interspace and the plantar left first second toe and her face on the left 06/09/17; wound on the left lateral leg continues to be smaller but very gradual and very dry surface using Hydrofera Blue 06/16/17 requires weekly debridements now on the left lateral leg although this continues to contract. I changed to silver collagen last week because of dryness of the wound bed. Using Iodoflex to the areas on his first and second toes/web space bilaterally 06/24/17; patient with history of paraplegia also chronic venous insufficiency with lymphedema. Has [name] very difficult wound on the left lateral leg. This has been gradually reducing in terms of with but comes in with [name] very dry adherent surface. High switch to silver collagen [name] week or so ago with hydrogel to keep the area moist. This is been refractory to multiple dressing attempts. He also has areas in his first and second toes bilaterally in the anterior and posterior web space. I had been using Iodoflex here after [name] prolonged course of silver alginate with ketoconazole was ineffective [question tinea pedis] 07/14/17; patient arrives today with [name] very difficult adherent material over his left lateral lower leg wound. He also has surrounding erythema and poorly controlled edema. He was switched his Santyl last visit which the nurses are applying once during his doctor visit and once on [name] nurse visit. He was also reduced to 2 layer compression I'm not exactly sure of the issue here. 07/21/17; better surface today after 1 week  of Iodoflex. Significant cellulitis that we treated last week also better. [Doxycycline] 07/28/17 better surface today with now 2 [name] of Iodoflex. Significant cellulitis treated with doxycycline. He has now completed the doxycycline and he is back to his usual degree of chronic venous inflammation/stasis dermatitis. He reminds me he has had ablations surgery here 08/04/17; continued improvement with Iodoflex to the left lateral leg wound in terms of the surface of the wound although the dimensions are better. 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 Problematic areas on the plantar aspect of the first second toe web space on the left and the dorsal aspect of the first second toe web space on the right. At one point I felt these were probably related to chronic fungal infections in treated him aggressively for this although we have not made any improvement here. 08/11/17; left lateral leg. Surface continues to improve with the Iodoflex although we are not seeing much improvement in overall wound dimensions. Areas on his plantar left foot and right foot show no improvement. In fact the right foot looks somewhat worse 08/18/17; left lateral leg. We changed to [facility] Blue last week after [name] prolonged course of Iodoflex which helps get the surface better. It appears that the wound with is improved. Continue with difficult areas on the left dorsal first second and plantar first second on the right 09/01/17; patient arrives in clinic today having had [name] temperature of 103 yesterday. He was seen in the ER and [facility]. The patient was concerned he could have cellulitis again in the right leg however they diagnosed him with [name] UTI and he is now on Keflex. He has [name] history of cellulitis which is been recurrent and difficult but this is been in the left leg, in the past 5 use doxycycline. He does in and out catheterizations at home which are  risk  factors for UTI 09/08/17; patient will be completing his Keflex this weekend. The erythema on the left leg is considerably better. He has [name] new wound today on the medial part of the right leg small superficial almost looks like [name] skin tear. He has worsening of the area on the right dorsal first and second toe. His major area on the left lateral leg is better. Using Hydrofera Blue on all areas 09/15/17; gradual reduction in width on the long wound in the left lateral leg. No debridement required. He also has wounds on the plantar aspect of his left first second toe web space and on the dorsal aspect of the right first second toe web space. 09/22/17; there continues to be very gradual improvements in the dimensions of the left lateral leg wound. He hasn't round erythematous spot with might be pressure on his wheelchair. There is no evidence obviously of infection no purulence no warmth He has [name] dry scaled area on the plantar aspect of the left first second toe Improved area on the dorsal right first second toe. 09/29/17; left lateral leg wound continues to improve in dimensions mostly with an is still [name] fairly long but increasingly narrow wound. He has [name] dry scaled area on the plantar aspect of his left first second toe web space Increasingly concerning area on the dorsal right first second toe. In fact I am concerned today about possible cellulitis around this wound. The areas extending up his second toe and although there is deformities here almost appears to abut on the nailbed. 10/06/17; left lateral leg wound continues to make very gradual progress. Tissue culture I did from the right first second toe dorsal foot last time grew MRSA and enterococcus which was vancomycin sensitive. This was not sensitive to clindamycin or doxycycline. He is allergic to Zyvox and sulfa we have therefore arrange for him to have dalvance infusion tomorrow. He is had this in the past and tolerated it well 10/20/17;  left lateral leg wound continues to make decent progress. This is certainly reduced in terms of with there is advancing epithelialization.The cellulitis in the right foot looks better although he still has [name] deep wound in the dorsal aspect of the first second toe web space. Plantar left first toe web space on the left I think is making some progress 10/27/17; left lateral leg wound continues to make decent progress. Advancing epithelialization.using Hydrofera Blue The right first second toe web space wound is better-looking using silver alginate Improvement in the left plantar first second toe web space. Again using silver alginate 11/03/17 left lateral leg wound continues to make decent progress albeit slowly. Using [facility] The right per second toe web space continues to be [name] very problematic looking punched out wound. I obtained [name] piece of tissue for deep culture I did extensively treated this for fungus. It is difficult to imagine that this is [name] pressure area as the patient states other than going outside he doesn't really wear shoes at home The left plantar first second toe web space looked fairly senescent. Necrotic edges. This required debridement change to [facility] Blue to all wound areas 11/10/17; left lateral leg wound continues to contract. Using Hydrofera Blue On the right dorsal first second toe web space dorsally. Culture I did of this area last week grew MRSA there is not an easy oral option in this patient was multiple antibiotic allergies or intolerances. This was only [name] rare culture isolate I'm therefore going to use Bactroban under silver  alginate On the left plantar first second toe web space. Debridement is required here. This is also unchanged 11/17/17; left lateral leg wound continues to contract using Hydrofera Blue this is no longer the major issue. The major concern here is the right first second toe web space. He now has an open area going from dorsally to the  plantar aspect. There is now wound on the inner lateral part of the first toe. Not [name] very viable surface on this. There is erythema spreading medially into the forefoot. No major change in the left first second toe plantar wound 11/24/17; left lateral leg wound continues to contract using Hydrofera Blue. Nice improvement today The right first second toe web space all of this looks [name] lot less angry than last week. I have given him clindamycin and topical Bactroban for MRSA and terbinafine for the possibility of underlining tinea pedis that I could not control with ketoconazole. Looks somewhat better The area on the plantar left first second toe web space is weeping with dried debris around the wound 12/01/17; left lateral leg wound continues to contract he Hydrofera Blue. It is becoming thinner in terms of with nevertheless it is making good improvement. The right first second toe web space looks less angry but still [name] large necrotic-looking wounds starting on the plantar aspect of the right foot extending between the toes and now extensively on the base of the right second toe. I gave him clindamycin and topical Bactroban for MRSA anterior benefiting for the possibility of underlying tinea pedis. Not looking better today The area on the left first/second toe looks better. Debrided of necrotic debris 12/05/17* the patient was worked in urgently today because over the weekend he found blood on his incontinence bad when he woke up. He was found to have an ulcer by his wife who does most of his wound care. He came in today for [place] to look at this. He has not had [name] history of wounds in his buttocks in spite of his paraplegia. 12/08/17; seen in follow-up today at his usual appointment. He was seen earlier this week and found to have [name] new wound on his buttock. We also follow him for wounds on the left lateral leg, left first second toe web space and right first second toe web space 12/15/17; we have been using  Hydrofera Blue to the left lateral leg which has improved. The right first second toe web space has also improved. Left first second toe web space plantar aspect looks stable. The left buttock has worsened using Santyl. Apparently the buttock has drainage 12/22/17; we have been using Hydrofera Blue to the left lateral leg which continues to improve now 2 small wounds separated by normal skin. He tells [place] he had [name] fever up to 100 yesterday he is prone to UTIs but has not noted anything different. He does in and out catheterizations. The area between the first and second toes today does not look good necrotic surface covered with what looks to be purulent drainage and erythema extending into the third toe. I had gotten this to something that I thought look better last time however it is not look good today. He also has [name] necrotic surface over the buttock wound which is expanded. I thought there might be infection under here so I removed [name] lot of the surface with [name] #5 curet though nothing look like it really needed culturing. He is been using Santyl to this area 12/27/17; his original wound on  the left lateral leg continues to improve using Hydrofera Blue. I gave him samples of Baxdella although he was unable to take them out of fear for an allergic reaction ["lump in his throat"].the culture I did of the purulent drainage from his second toe last week showed both enterococcus and [name] set Enterobacter I was also concerned about the erythema on the bottom of his foot although paradoxically although this looks somewhat better today. 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 We've been using silver alginate here. They came up today that he sleeps in [name] recliner, I'm not sure why but I asked him to stop this 01/03/18; his original wound we've been using Hydrofera Blue is now separated into 2 areas. Ulcer on his left  buttock is better he is off the recliner and sleeping in bed Finally both wound areas between his first and second toes also looks some better 01/10/18; his original wound on the left lateral leg is now separated into 2 wounds we've been using Hydrofera Blue Ulcer on his left buttock has some drainage. There is [name] small probing site going into muscle layer superiorly.using silver alginate -He arrives today with [name] deep tissue injury on the left heel The wound on the dorsal aspect of his first second toe on the left looks [name] lot betterusing silver alginate ketoconazole The area on the first second toe web space on the right also looks [name] lot bette 01/17/18; his original wound on the left lateral leg continues to progress using Hydrofera Blue Ulcer on his left buttock also is smaller surface healthier except for [name] small probing site going into the muscle layer superiorly. 2.4 cm of tunneling in this area DTI on his left heel we have only been offloading. Looks better than last week no threatened open no evidence of infection the wound on the dorsal aspect of the first second toe on the left continues to look like it's regressing we have only been using silver alginate and terbinafine orally The area in the first second toe web space on the right also looks to be [name] lot better using silver alginate and terbinafine I think this was prompted by tinea pedis 01/31/18; the patient was hospitalized in [place] last week apparently for [name] complicated UTI. He was discharged on cefepime he does in and out catheterizations. In the hospital he was discovered M I don't mild elevation of AST and ALT and the terbinafine was stopped.predictably the pressure ulcer on s his buttock looks betterusing silver alginate. The area on the left lateral leg also is better using Hydrofera Blue. The area between the first and second toes on the left better. First and second toes on the right still substantial but better. Finally the DTI  on the left heel has held together and looks like it's resolving 02/07/18-he is here in follow-up evaluation for multiple ulcerations. He has new injury to the lateral aspect of the last issue [name] pressure ulcer, he states this is from adhesive removal trauma. He states he has tried multiple adhesive products with no success. All other ulcers appear stable. The left heel DTI is resolving. We will continue with same treatment plan and follow-up next week. 02/14/18; follow-up for multiple areas. He has [name] new area last week on the lateral aspect of his pressure ulcer more over the posterior trochanter. The original pressure ulcer looks quite stable has healthy granulation. 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 Almost fully epithelialized on the original superior area using Hydrofera Blue DTI on the left heel has peeled off this week to reveal [name] small superficial wound under denuded skin and subcutaneous tissue Both areas between the first and second toes look better including nothing open on the left 02/21/18; The patient's wounds on his left ischial tuberosity and posterior left greater trochanter actually looked better. He has [name] large area of irritation around the area which I think is contact dermatitis. I am doubtful that this is fungal His original wound on the left lateral calf continues to improve we have been using Hydrofera Blue There is no open area in the left first second toe web space although there is [name] lot of thick callus The DTI on the left heel required debridement today of necrotic surface eschar and subcutaneous tissue using silver alginate Finally the area on the right first second toe webspace continues to contract using silver alginate and ketoconazole 02/28/18 Left ischial tuberosity wounds look better using silver alginate. Original wound on the left calf only has one small  open area left using Hydrofera Blue DTI on the left heel required debridement mostly removing skin from around this wound surface. Using silver alginate The areas on the right first/second toe web space using silver alginate and ketoconazole 03/08/18 on evaluation today patient appears to be doing decently well as best I can tell in regard to his wounds. This is the first time that I have seen him as he generally is followed by Dr. Dellia Nims. With that being said none of his wounds appear to be infected he does have an area where there is some skin covering what appears to be [name] new wound on the left dorsal surface of his great toe. This is right at the nail bed. With that being said I do believe that debrided away some of the excess skin can be of benefit in this regard. Otherwise he has been tolerating the dressing changes without complication. 03/14/18; patient arrives today with the multiplicity of wounds that we are following. He has not been systemically unwell Original wound on the left lateral calf now only has 2 small open areas we've been using Hydrofera Blue which should continue The deep tissue injury on the left heel requires debridement today. We've been using silver alginate The left first second toe and the right first second toe are both are reminiscence what I think was tinea pedis. Apparently some of the callus Surface between the toes was removed last week when it started draining. Purulent drainage coming from the wound on the ischial tuberosity on the left. 03/21/18-He is here in follow-up evaluation for multiple wounds. 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 He tolerated debridement. The only change to last week's recommendations is to discontinue antifungal cream between toes. He will follow-up next week 03/28/18; following up for multiple wounds;Concern this week is streaking redness and swelling in the  right foot. He is going to need antibiotics for this. 03/31/18; follow-up for right foot cellulitis. Streaking redness and swelling in the right foot on 03/28/18. 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 I put him on clindamycin 300 mg every 6 and brought him in for [name] quick check. He has an open wound between his first and second toes on the right foot as  [name] potential source. 04/04/18; Right foot cellulitis is resolving he is completing clindamycin. This is truly good news Left lateral calf wound which is initial wound only has one small open area inferiorly this is close to healing out. He has compression stockings. We will use Hydrofera Blue right down to the epithelialization of this Nonviable surface on the left heel which was initially pressure with [name] DTI. We've been using Hydrofera Blue. I'm going to switch this back to silver alginate Left first second toe/tinea pedis this looks better using silver alginate Right first second toe tinea pedis using silver alginate Large pressure ulcers on theLeft ischial tuberosity. Small wound here Looks better. I am uncertain about the surface over the large wound. Using silver alginate 04/11/18; Cellulitis in the right foot is resolved Left lateral calf wound which was his original wounds still has 2 tiny open areas remaining this is just about closed Nonviable surface on the left heel is better but still requires debridement Left first second toe/tinea pedis still open using silver alginate Right first second toe wound tinea pedis I asked him to go back to using ketoconazole and silver alginate Large pressure ulcers on the left ischial tuberosity this shear injury here is resolved. Wound is smaller. No evidence of infection using silver alginate 04/18/18; Patient arrives with an intense area of cellulitis in the right mid lower calf extending into the right heel area. Bright red and warm. Smaller area on the left anterior leg. He has [name]  significant history of MRSA. He will definitely need antibioticsdoxycycline He now has 2 open areas on the left ischial tuberosity the original large wound and now [name] satellite area which I think was above his initial satellite areas. Not [name] wonderful surface on this satellite area surrounding erythema which looks like pressure related. His left lateral calf wound again his original wound is just about closed Left heel pressure injury still requiring debridement Left first second toe looks [name] lot better using silver alginate Right first second toe also using silver alginate and ketoconazole cream also looks better 04/20/18; the patient was worked in early today out of concerns with his cellulitis on the right leg. I had started him on doxycycline. This was 2 days ago. His wife was concerned about the swelling in the area. Also concerned about the left buttock. He has not been systemically unwell no fever chills. No nausea vomiting or diarrhea 04/25/18; the patient's left buttock wound is continued to deteriorate he is using Hydrofera Blue. He is still completing clindamycin for the cellulitis on the right leg although all of this looks better. 05/02/18 Left buttock wound still with [name] lot of drainage and [name] very tightly adherent fibrinous necrotic surface. He has [name] deeper area superiorly The left lateral calf wound is still closed DTI wound on the left heel necrotic surface especially the circumference using Iodoflex Areas between his left first second toe and right first second toe both look better. Dorsally and the right first second toe he had [name] necrotic surface although at smaller. In using silver alginate and ketoconazole. I did [name] culture last week which was [name] deep tissue culture of the reminiscence of the open wound on the right first second toe dorsally. This grew [name] few Acinetobacter and [name] few methicillin-resistant staph aureus. Nevertheless the area actually this week looked better. I didn't feel  the need to specifically address this at least in terms of systemic antibiotics. 05/09/18; wounds are measuring larger more drainage per our intake.  We are using Santyl covered with alginate on the large superficial buttock wounds, Iodosorb on the left heel, ketoconazole and silver alginate to the dorsal first and second toes bilaterally. 05/16/18; The area on his left buttock better in some aspects although the area superiorly over the ischial tuberosity required an extensive debridement.using Santyl Left heel appears stable. Using Iodoflex The areas between his first and second toes are not bad however there is spreading erythema up the dorsal aspect of his left foot this looks like cellulitis again. He is insensate the erythema is really very brilliant.o Erysipelas He went to see an allergist days ago because he was itching part of this he had lab work done. This showed [name] white count of 15.1 with 70% neutrophils. Hemoglobin of 11.4 and [name] platelet count of 659,000. Last white count we had in Epic was [name] 2-1/2 years ago which was 25.9 but he was ill at the time. He was able to show me some lab work that was done by his primary physician the pattern is about the same. 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 But prompted me to go ahead and do x-rays of both feet and the pelvis rule out osteomyelitis. He also had [name] comprehensive metabolic panel this was reasonably normal his albumin was 3.7 liver function tests BUN/creatinine all normal 05/23/18; x-rays of both his feet from last week were negative for underlying pulmonary abnormality. The x-ray of his pelvis however showed mild irregularity in the left ischial which may represent some early osteomyelitis. The wound in the left ischial continues to get deeper clearly now exposed muscle. Each week necrotic surface material over this area. Whereas the rest of the wounds do not look so bad. The left ischial  wound we have been using Santyl and calcium alginate T the left heel surface necrotic debris using Iodoflex o The left lateral leg is still healed Areas on the left dorsal foot and the right dorsal foot are about the same. There is some inflammation on the left which might represent contact dermatitis, fungal dermatitis I am doubtful cellulitis although this looks better than last week 05/30/18; CT scan done at Hospital did not show any osteomyelitis or abscess. Suggested the possibility of underlying cellulitis although I don't see [name] lot of evidence of this at the bedside The wound itself on the left buttock/upper thigh actually looks somewhat better. No debridement Left heel also looks better no debridement continue Iodoflex Both dorsal first second toe spaces appear better using Lotrisone. Left still required debridement 06/06/18; Intake reported some purulent looking drainage from the left gluteal wound. Using Santyl and calcium alginate Left heel looks better although still [name] nonviable surface requiring debridement The left dorsal foot first/second webspace actually expanding and somewhat deeper. I may consider doing [name] shave biopsy of this area Right dorsal foot first/second webspace appears stable to improved. Using Lotrisone and silver alginate to both these areas 06/13/18 Left gluteal surface looks better. Now separated in the 2 wounds. No debridement required. Still drainage. We'll continue silver alginate Left heel continues to look better with Iodoflex continue this for at least another week Of his dorsal foot wounds the area on the left still has some depth although it looks better than last week. We've been using Lotrisone and silver alginate 06/20/18 Left gluteal continues to look better healthy tissue Left heel continues to look better healthy granulation wound is smaller. He is using Iodoflex and his  long as this continues continue the Iodoflex Dorsal right foot looks better  unfortunately dorsal left foot does not. There is swelling and erythema of his forefoot. He had minor trauma to this several days ago but doesn't think this was enough to have caused any tissue injury. Foot looks like cellulitis, we have had this problem before 06/27/18 on evaluation today patient appears to be doing [name] little worse in regard to his foot ulcer. 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 Both of which would really be his only options for treating this infection. In the past he has been given and effusion of Orbactiv. This is done very well for him in the past again it's one time dosing IV antibiotic therapy. Subsequently I do believe this is something we're gonna need to see about doing at this point in time. Currently his other wounds seem to be doing somewhat better in my pinion I'm pretty happy in that regard. 07/03/18 on evaluation today patient's wounds actually appear to be doing fairly well. He has been tolerating the dressing changes without complication. All in all he seems to be showing signs of improvement. In regard to the antibiotics he has been dealing with infectious disease since I saw him last week as far as getting this scheduled. In the end [facility]'s [facility] this coming Friday. In the meantime he has been continuing to perform the dressing changes in such as previous. There does not appear to be any evidence of infection worsengin at this time. 07/10/18; Since I last saw this man 2 [name] ago things have actually improved. IV antibiotics of resulted in less forefoot erythema although there is still some present. He is not systemically unwell Left buttock wounds 2 now have no depth there is increased epithelialization Using silver alginate Left heel still requires debridement  using Iodoflex Left dorsal foot still with [name] sizable wound about the size of [name] border but healthy granulation Right dorsal foot still with [name] slitlike area using silver alginate 07/18/18; the patient's cellulitis in the left foot is improved in fact I think it is on its way to resolving. Left buttock wounds 2 both look better although the larger one has hypertension granulation we've been using silver alginate Left heel has some thick circumferential redundant skin over the wound edge which will need to be removed today we've been using Iodoflex Left dorsal foot is still [name] sizable wound required debridement using silver alginate The right dorsal foot is just about closed only [name] small open area remains here 07/25/18; left foot cellulitis is resolved Left buttock wounds 2 both look better. Hyper-granulation on the major area Left heel as some debris over the surface but otherwise looks [name] healthier wound. Using silver collagen Right dorsal foot is just about closed 07/31/18; arrives with our intake nurse worried about purulent drainage from the buttock. We had hyper-granulation here last week His buttock wounds 2 continue to look better Left heel some debris over the surface but measuring smaller. Right dorsal foot unfortunately has openings between the toes Left foot superficial wound looks less aggravated. 08/07/18 Buttock wounds continue to look better although some of her granulation and the larger medial wound. silver alginate Left heel continues to look [name] lot better.silver collagen Left foot superficial wound looks less stable.  Requires debridement. He has [name] new wound superficial area on the foot on the lateral dorsal foot. Right foot looks better using silver alginate without Lotrisone 08/14/2018; patient was in the ER last week diagnosed with [name] UTI. He is now on Cefpodoxime and Macrodantin. Buttock wounds continued to be smaller. Using silver alginate Left heel continues to look better using  silver collagen Left foot superficial wound looks as though it is improving Right dorsal foot area is just about healed. 08/21/2018; patient is completed his antibiotics for his UTI. He has 2 open areas on the buttocks. There is still not closed although the surface looks satisfactory. Using silver alginate Left heel continues to improve using silver collagen The bilateral dorsal foot areas which are at the base of his first and second toes/possible tinea pedis are actually stable on the left but worse on the right. The area on the left required debridement of necrotic surface. After debridement I obtained [name] specimen for PCR culture. The right dorsal foot which is been just about healed last week is now reopened 08/28/2018; culture done on the left dorsal foot showed coag negative staph both staph epidermidis and Lugdunensis. I think this is worthwhile initiating systemic treatment. I will use doxycycline given his long list of allergies. The area on the left heel slightly improved but still requiring debridement. The large wound on the buttock is just about closed whereas the smaller one is larger. Using silver alginate in this area 09/04/2018; patient is completing his doxycycline for the left foot although this continues to be [name] very difficult wound area with very adherent necrotic debris. We are using silver alginate to all his wounds right foot left foot and the small wounds on his buttock, silver collagen on the left heel. 09/11/2018; once again this patient has intense erythema and swelling of the left forefoot. Lesser degrees of erythema in the right foot. He has [name] long list of allergies and intolerances. I will reinstitute doxycycline. 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 Using silver alginate Left heel also looks better using silver collagen Unfortunately both the areas on his feet look worse. The area on the left first second webspace  is now gone through to the plantar part of his foot. The area on the left foot anteriorly is irritated with erythema and swelling in the forefoot. 09/25/2018 His wound on the left plantar heel looks better. Using silver collagen The area on the left buttock 2 small remnant areas. One is closed one is still open. Using silver alginate The areas between both his first and second toes look worse. This in spite of long-standing antifungal therapy with ketoconazole and silver alginate which should have antifungal activity He has small areas around his original wound on the left calf one is on the bottom of the original scar tissue and one superiorly both of these are small and superficial but again given wound history in this site this is worrisome 10/02/2018 Left plantar heel continues to gradually contract using silver collagen Left buttock wound is unchanged using silver alginate The areas on his dorsal feet between his first and second toes bilaterally look about the same. I prescribed clindamycin ointment to see if we can address chronic staph colonization and also the underlying possibility of erythrasma The left lateral lower extremity wound is actually on the lateral part of his ankle. Small open area here. We have been using silver alginate 10/09/2018; Left plantar  heel continues to look healthy and contract. No debridement is required Left buttock slightly smaller with [name] tape injury wound just below which was new this week Dorsal feet somewhat improved I have been using clindamycin Left lateral looks lower extremity the actual open area looks worse although [name] lot of this is epithelialized. I am going to change to silver collagen today He has [name] lot more swelling in the right leg although this is not pitting not red and not particularly warm there is [name] lot of spasm in the right leg usually indicative of people with paralysis of some underlying discomfort. We have reviewed his vascular status  from 2017 he had [name] left greater saphenous vein ablation. I wonder about referring him back to vascular surgery if the area on the left leg continues to deteriorate. 10/16/2018 in today for follow-up and management of multiple lower extremity ulcers. His left Buttock wound is much lower smaller and almost closed completely. The wound to the left ankle has began to reopen with Epithelialization and some adherent slough. He has multiple new areas to the left foot and leg. The left dorsal foot without much improvement. Wound present between left great webspace and 2nd toe. Erythema and edema present right leg. Right LE ultrasound obtained on 10/10/18 was negative for DVT . 10/23/2018; Left buttock is closed over. Still dry macerated skin but there is no open wound. I suspect this is chronic pressure/moisture Left lateral calf is quite [name] bit worse than when I saw this last. There is clearly drainage here he has macerated skin into the left plantar heel. We will change the primary dressing to alginate Left dorsal foot has some improvement in overall wound area. Still using clindamycin and silver alginate Right dorsal foot about the same as the left using clindamycin and silver alginate The erythema in the right leg has resolved. He is DVT rule out was negative Left heel pressure area required debridement although the wound is smaller and the surface is health 10/26/2018 The patient came back in for his nurse check today predominantly because of the drainage coming out of the left lateral leg with [name] recent reopening of his original wound on the left lateral calf. He comes in today with [name] large amount of surrounding erythema around the wound extending from the calf into the ankle and even in the area on the dorsal foot. He is not systemically unwell. He is not febrile. Nevertheless this looks like cellulitis. We have been using silver alginate to the area. I changed him to [name] regular visit and I am going to  prescribe him doxycycline. The rationale here is [name] long list of medication intolerances and [name] history of MRSA. I did not see anything that I thought would provide [name] valuable culture 10/30/2018 Follow-up from his appointment 4 days ago with really an extensive area of cellulitis in the left calf left lateral ankle and left dorsal foot. I put him on doxycycline. He has [name] long list of medication allergies which are true allergy reactions. 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 In any case he is [name] lot better today. The erythema especially in the anterior and lateral left calf is better. He still has left ankle erythema. He also is complaining about increasing edema in the right leg we have only been using Kerlix Coban and he has been doing the wraps at home. Finally he has [name] spotty rash on the  medial part of his upper left calf which looks like folliculitis or perhaps wrap occlusion type injury. Small superficial macules not pustules 11/06/18 patient arrives today with again [name] considerable degree of erythema around the wound on the left lateral calf extending into the dorsal ankle and dorsal foot. This is [name] lot worse than when I saw this last week. He is on doxycycline really with not [name] lot of improvement. He has not been systemically unwell Wounds on the; left heel actually looks improved. Original area on the left foot and proximity to the first and second toes looks about the same. He has superficial areas on the dorsal foot, anterior calf and then the reopening of his original wound on the left lateral calf which looks about the same The only area he has on the right is the dorsal webspace first and second which is smaller. He has [name] large area of dry erythematous skin on the left buttock small open area here. 11/13/2018; the patient arrives in much better condition. The erythema around the wound on the left lateral calf is [name] lot  better. Not sure whether this was the clindamycin or the TCA and ketoconazole or just in the improvement in edema control [stasis dermatitis]. In any case this is [name] lot better. The area on the left heel is very small and just about resolved using silver collagen we have been using silver alginate to the areas on his dorsal feet 11/20/2018; his wounds include the left lateral calf, left heel, dorsal aspects of both feet just proximal to the first second webspace. He is stable to slightly improved. I did not think any changes to his dressings were going to be necessary 11/27/2018 he has [name] reopening on the left buttock which is surrounded by what looks like tinea or perhaps some other form of dermatitis. The area on the left dorsal foot has some erythema around it I have marked this area but I am not sure whether this is cellulitis or not. Left heel is not closed. Left calf the reopening is really slightly longer and probably worse 1/13; in general things look better and smaller except for the left dorsal foot. Area on the left heel is just about closed, left buttock looks better only [name] small wound remains in the skin looks better [using Lotrisone] 1/20; the area on the left heel only has [name] few remaining open areas here. Left lateral calf about the same in terms of size, left dorsal foot slightly larger right lateral foot still not closed. The area on the left buttock has no open wound and the surrounding skin looks [name] lot better 1/27; the area on the left heel is closed. Left lateral calf better but still requiring extensive debridements. The area on his left buttock is closed. He still has the open areas on the left dorsal foot which is slightly smaller in the right foot which is slightly expanded. We have been using Iodoflex on these areas as well 2/3; left heel is closed. Left lateral calf still requiring debridement using Iodoflex there is no open area on his left buttock however he has dry scaly skin  over [name] large area of this. Not really responding well to the Lotrisone. Finally the areas on his dorsal feet at the level of the first second webspace are slightly smaller on the right and about the same on the left. Both of these vigorously debrided with Anasept and gauze 2/10; left heel remains closed he has dry erythematous skin  over the left buttock but there is no open wound here. Left lateral leg has come in and with. Still requiring debridement we have been using Iodoflex here. Finally the area on the left dorsal foot and right dorsal foot are really about the same extremely dry callused fissured areas. He does not yet have [name] dermatology appointment 2/17; left heel remains closed. He has [name] new open area on the left buttock. The area on the left lateral calf is bigger longer and still covered in necrotic debris. No major change in his foot areas bilaterally. I am awaiting for [name] dermatologist to look on this. We have been using ketoconazole I do not know that this is been doing any good at all. 2/24; left heel remains closed. The left buttock wound that was new reopening last week looks better. The left lateral calf appears better also although still requires debridement. The major area on his foot is the left first second also requiring debridement. We have been putting Prisma on all wounds. I do not believe that the ketoconazole has done too much good for his feet. He will use Lotrisone I am going to give him [name] 2-week course of terbinafine. We still do not have [name] dermatology appointment 3/2 left heel remains closed however there is skin over bone in this area I pointed this out to him today. The left buttock wound is epithelialized but still does not look completely stable. The area on the left leg required debridement were using silver collagen here. With regards to his feet we changed to Lotrisone last week and silver alginate. 3/9; left heel remains closed. Left buttock remains closed. The  area on the right foot is essentially closed. The left foot remains unchanged. Slightly smaller on the left lateral calf. Using silver collagen to both of these areas 3/16-Left heel remains closed. Area on right foot is closed. Left lateral calf above the lateral malleolus open wound requiring debridement with easy bleeding. Left dorsal wound proximal to first toe also debrided. Left ischial area open new. Patient has been using Prisma with wrapping every 3 days. Dermatology appointment is apparently tomorrow.Patient has completed his terbinafine 2-week course with some apparent improvement according to him, there is still flaking and dry skin in his foot on the left 3/23; area on the right foot is reopened. The area on the left anterior foot is about the same still [name] very necrotic adherent surface. He still has the area on the left leg and reopening is on the left buttock. He apparently saw dermatology although I do not have [name] note. According to the patient who is usually fairly well informed they did not have any good ideas. Put him on oral terbinafine which she is been on before. 3/30; using silver collagen to all wounds. Apparently his dermatologist put him on doxycycline and rifampin presumably some culture grew staph. I do not have this result. He remains on terbinafine although I have used terbinafine on him before 4/6; patient has had [name] fairly substantial reopening on the right foot between the first and second toes. He is finished his terbinafine and I believe is on doxycycline and rifampin still as prescribed by dermatology. We have been using silver collagen to all his wounds although the patient reports that he thinks silver alginate does better on the wounds on his buttock. 4/13; the area on his left lateral calf about the same size but it did not require debridement. Left dorsal foot just proximal to the webspace  between the first and second toes is about the same. Still nonviable  surface. I note some superficial bronze discoloration of the dorsal part of his foot Right dorsal foot just proximal to the first and second toes also looks about the same. I still think there may be the same discoloration I noted above on the left Left buttock wound looks about the same 4/20; left lateral calf appears to be gradually contracting using silver collagen. 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 The left dorsal foot wound is debrided of tightly adherent necrotic debris and really cleans up quite nicely. The right area is worse with expansion. I did not debride this it is now over the base of the second toe The area on his left buttock is smaller no debridement is required using silver collagen 5/4; left calf continues to make good progress. He arrives with erythema around the wounds on his dorsal foot which even extends to the plantar aspect. Very concerning for coexistent infection. He is finished the erythromycin I gave him for possible erythrasma this does not seem to have helped. The area on the left foot is about the same base of the dorsal toes Is area on the buttock looks improved on the left 5/11; left calf and left buttock continued to make good progress. Left foot is about the same to slightly improved. Major problem is on the right foot. He has not had an x-ray. Deep tissue culture I did last week showed both Enterobacter and E. coli. I did not change the doxycycline I put him on empirically although neither 1 of these were plated to doxycycline. He arrives today with the erythema looking worse on both the dorsal and plantar foot. Macerated skin on the bottom of the foot. he has not been systemically unwell 5/18-Patient returns at 1 week, left calf wound appears to be making some progress, left buttock wound appears slightly worse than last time, left foot wound looks slightly better, right foot redness is  marginally better. X-ray of both feet show no air or evidence of osteomyelitis. Patient is finished his Omnicef and terbinafine. He continues to have macerated skin on the bottom of the left foot as well as right 5/26; left calf wound is better, left buttock wound appears to have multiple small superficial open areas with surrounding macerated skin. X-rays that I did last time showed no evidence of osteomyelitis in either foot. He is finished cefdinir and doxycycline. I do not think that he was on terbinafine. He continues to have [name] large superficial open area on the right foot anterior dorsal and slightly between the first and second toes. I did send him to dermatology 2 months ago or so wondering about whether they would do [name] fungal scraping. I do not believe they did but did do [name] culture. We have been using silver alginate to the toe areas, he has been using antifungals at home topically either ketoconazole or Lotrisone. We are using silver collagen on the left foot, silver alginate on the right, silver collagen on the left lateral leg and silver alginate on the left buttock 6/1; left buttock area is healed. We have the left dorsal foot, left lateral leg and right dorsal foot. We are using silver alginate to the areas on both feet and silver collagen to the area on his left lateral calf 6/8; the left buttock apparently reopened late last week. He is not really sure how this happened. He is tolerating  the terbinafine. Using silver alginate to all wounds 6/15; left buttock wound is larger than last week but still superficial. Came in the clinic today with [name] report of purulence from the left lateral leg I did not identify any infection Both areas on his dorsal feet appear to be better. He is tolerating the terbinafine. Using silver alginate to all wounds 6/22; left buttock is about the same this week, left calf quite [name] bit better. His left foot is about the same however he comes in with erythema and  warmth in the right forefoot once again. Culture that I gave him in the beginning of May showed Enterobacter and E. coli. I gave him doxycycline and things seem to improve although neither 1 of these organisms was specifically plated. 6/29; left buttock is larger and dry this week. Left lateral calf looks to me to be improved. Left dorsal foot also somewhat improved right foot completely unchanged. The erythema on the right foot is still present. He is completing the Ceftin dinner that I gave him empirically [see discussion above.) 7/6 - All wounds look to be stable and perhaps improved, the left buttock wound is slightly smaller, per patient bleeds easily, completed ceftin, the right foot redness is less, he is on terbinafine 7/13; left buttock wound about the same perhaps slightly narrower. Area on the left lateral leg continues to narrow. Left dorsal foot slightly smaller right foot about the same. We are using silver alginate on the right foot and Hydrofera Blue to the areas on the left. Unna boot on the left 2 layer compression on the right 7/20; left buttock wound absolutely the same. Area on lateral leg continues to get better. Left dorsal foot require debridement as did the right no major change in the 7/27; left buttock wound the same size necrotic debris over the surface. The area on the lateral leg is closed once again. His left foot looks better right foot about the same although there is some involvement now of the posterior first second toe area. He is still on terbinafine which I have given him for [name] month, not certain [name] centimeter major change 06/25/19-All wounds appear to be slightly improved according to report, left buttock wound looks clean, both foot wounds have minimal to no debris the right dorsal foot has minimal slough. We are using Hydrofera Blue to the left and silver alginate to the right foot and ischial wound. 8/10-Wounds all appear to be around the same, the right  forefoot distal part has some redness which was not there before, however the wound looks clean and small. Ischial wound looks about the same with no changes 8/17; his wound on the left lateral calf which was his original chronic venous insufficiency wound remains closed. Since I last saw him the areas on the left dorsal foot right dorsal foot generally appear better but require debridement. The area on his left initial tuberosity appears somewhat larger to me perhaps hyper granulated and bleeds very easily. We have been using Hydrofera Blue to the left dorsal foot and silver alginate to everything else 8/24; left lateral calf remains closed. The areas on his dorsal feet on the webspace of the first and second toes bilaterally both look better. The area on the left buttock which is the pressure ulcer stage II slightly smaller. I change the dressing to Hydrofera Blue to all areas 8/31; left lateral calf remains closed. The area on his dorsal feet bilaterally look better. Using Hydrofera Blue. Still requiring debridement  on the left foot. No change in the left buttock pressure ulcers however 9/14; left lateral calf remains closed. Dorsal feet look quite [name] bit better than 2 [name] ago. Flaking dry skin also [name] lot better with the ammonium lactate I gave him 2 [name] ago. The area on the left buttock is improved. He states that his Roho cushion developed [name] leak and he is getting [name] new one, in the interim he is offloading this vigorously 9/21; left calf remains closed. Left heel which was [name] possible DTI looks better this week. He had macerated tissue around the left dorsal foot right foot looks satisfactory and improved left buttock wound. I changed his dressings to his feet to silver alginate bilaterally. Continuing Hydrofera Blue on the left buttock. 9/28 left calf remains closed. Left heel did not develop anything [possible DTI] dry flaking skin on the left dorsal foot. Right foot looks satisfactory.  Improved left buttock wound. We are using silver alginate on his feet Hydrofera Blue on the buttock. I have asked him to go back to the Lotrisone on his feet including the wounds and surrounding areas 10/5; left calf remains closed. The areas on the left and right feet about the same. [name] lot of this is epithelialized however debris over the remaining open areas. He is using Lotrisone and silver alginate. The area on the left buttock using Hydrofera Blue 10/26. Patient has been out for 3 [name] secondary to Covid concerns. He tested negative but I think his wife tested positive. He comes in today with the left foot substantially worse, right foot about the same. Even more concerning he states that the area on his left buttock closed over but then reopened and is considerably deeper in one aspect than it was before [stage III wound] 11/2; left foot really about the same as last week. Quarter sized wound on the dorsal foot just proximal to the first second toes. Surrounding erythema with areas of denuded epithelium. This is not really much different looking. Did not look like cellulitis this time however. Right foot area about the same.. We have been using silver alginate alginate on his toes Left buttock still substantial irritated skin around the wound which I think looks somewhat better. We have been using Hydrofera Blue here. 11/9; left foot larger than last week and [name] very necrotic surface. Right foot I think is about the same perhaps slightly smaller. Debris around the circumference also addressed. Unfortunately on the left buttock there is been [name] decline. Satellite lesions below the major wound distally and now [name] an additional one posteriorly we have been using Hydrofera Blue but I think this is [name] pressure issue 11/16; left foot ulcer dorsally again [name] very adherent necrotic surface. Right foot is about the same. Not much change in the pressure ulcer on his left buttock. 11/30; left foot ulcer  dorsally basically the same as when I saw him 2 [name] ago. Very adherent fibrinous debris on the wound surface. Patient reports [name] lot of drainage as well. 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 We have been using Iodoflex, patient changed back to alginate because of the drainage. Area on his right dorsal foot really looks benign with [name] healthier surface certainly [name] lot better than on the left. Left buttock wounds all improved using Hydrofera Blue 12/7; left dorsal foot again no improvement. Tightly adherent debris. PCR culture I did last week only showed likely skin contaminant. I have gone ahead  and done [name] punch biopsy of this which is about the last thing in terms of investigations I can think to do. He has known venous insufficiency and venous hypertension and this could be the issue here. The area on the right foot is about the same left buttock slightly worse according to our intake nurse secondary to [facility] Blue sticking to the wound 12/14; biopsy of the left foot that I did last time showed changes that could be related to wound healing/chronic stasis dermatitis phenomenon no neoplasm. We have been using silver alginate to both feet. I change the one on the left today to Sorbact and silver alginate to his other 2 wounds 12/28; the patient arrives with the following problems; Major issue is the dorsal left foot which continues to be [name] larger deeper wound area. Still with [name] completely nonviable surface Paradoxically the area mirror image on the right on the right dorsal foot appears to be getting better. He had some loss of dry denuded skin from the lower part of his original wound on the left lateral calf. Some of this area looked [name] little vulnerable and for this reason we put him in wrap that on this side this week The area on his left buttock is larger. He still has the erythematous circular area which I think is [name] combination of  pressure, sweat. This does not look like cellulitis or fungal dermatitis 11/26/2019; -Dorsal left foot large open wound with depth. Still debris over the surface. Using Sorbact The area on the dorsal right foot paradoxically has closed over He has [name] reopening on the left ankle laterally at the base of his original wound that extended up into the calf. This appears clean. The left buttock wound is smaller but with very adherent necrotic debris over the surface. We have been using silver alginate here as well The patient had arterial studies done in 2017. He had biphasic waveforms at the dorsalis pedis and posterior tibial bilaterally. ABI in the left was 1.17. Digit waveforms were dampened. He has slight spasticity in the great toes I do not think [name] TBI would be possible 1/11; the patient comes in today with [name] sizable reopening between the first and second toes on the right. This is not exactly in the same location where we have been treating wounds previously. According to our intake nurse this was actually fairly deep but 0.6 cm. The area on the left dorsal foot looks about the same the surface is somewhat cleaner using Sorbact, his MRI is in 2 days. We have not managed yet to get arterial studies. The new reopening on the left lateral calf looks somewhat better using alginate. The left buttock wound is about the same using alginate 1/18; the patient had his ARTERIAL studies which were quite normal. ABI in the right at 1.13 with triphasic/biphasic waveforms on the left ABI 1.06 again with triphasic/biphasic waveforms. It would not have been possible to have done [name] toe brachial index because of spasticity. We have been using Sorbac to the left foot alginate to the rest of his wounds on the right foot left lateral calf and left buttock 1/25; arrives in clinic with erythema and swelling of the left forefoot worse over the first MTP area. This extends laterally dorsally and but also posteriorly. Still  has an area on the left lateral part of the lower part of his calf wound it is eschared and clearly not closed. Area on the left buttock still with surrounding irritation and  erythema. Right foot surface wound dorsally. The area between the right and first and second toes appears better. 2/1; The left foot wound is about the same. Erythema slightly better I gave him [name] week of doxycycline empirically Right foot wound is more extensive extending between the toes to the plantar surface Left lateral calf really no open surface on the inferior part of his original wound however the entire area still looks vulnerable Absolutely no improvement in the left buttock wound required debridement. 2/8; the left foot is about the same. Erythema is slightly improved I gave him clindamycin last week. Right foot looks better he is using Lotrimin and silver alginate He has [name] breakdown in the left lateral calf. Denuded epithelium which I have removed Left buttock about the same were using Hydrofera Blue 2/15; left foot is about the same there is less surrounding erythema. Surface still has tightly adherent debris which I have debriding however not making any progress Right foot has [name] substantial wound on the medial right second toe between the first and second webspace. Still an open area on the left lateral calf distal area. Buttock wound is about the same 2/22; left foot is about the same less surrounding erythema. Surface has adherent debris. Polymen Ag Right foot area significant wound between the first and second toes. We have been using silver alginate here Left lateral leg polymen Ag at the base of his original venous insufficiency wound Left buttock some improvement here 3/1; Right foot is deteriorating in the first second toe webspace. Larger and more substantial. We have been using silver alginate. Left dorsal foot about the same markedly adherent surface debris using PolyMem Ag Left lateral calf  surface debris using PolyMem AG Left buttock is improved again using PolyMem Ag. He is completing his terbinafine. The erythema in the foot seems better. He has been on this for 2 [name] 3/8; no improvement in any wound area in fact he has [name] small open area on the dorsal midfoot which is new this week. He has not gotten his foot x-rays yet 3/15; his x-rays were both negative for osteomyelitis of both feet. No major change in any of his wounds on the extremities however his buttock wounds are better. We have been using polymen on the buttocks, left lower leg. Iodoflex on the left foot and silver alginate on the right 3/22; arrives in clinic today with the 2 major issues are the improvement in the left dorsal foot wound which for once actually looks healthy with [name] nice healthy wound surface without debridement. Using Iodoflex here. Unfortunately on the left lateral calf which is in the distal part of his original wound he came to the clinic here for there was purulent drainage noted some increased breakdown scattered around the original area and [name] small area proximally. We we are using polymen here will change to silver alginate today. His buttock wound on the left is better and I think the area on the right first second toe webspace is also improved 3/29; left dorsal foot looks better. Using Iodoflex. Left ankle culture from deterioration last time grew E. coli, Enterobacter and Enterococcus. I will give him [name] course of cefdinir although that will not cover Enterococcus. The area on the right foot in the webspace of the first and second toe lateral first toe looks better. The area on his buttock is about healed Vascular appointment is on April 21. This is to look at his venous system vis--vis continued breakdown of the wounds  on the left including the left lateral leg and left dorsal foot he. He has had previous ablations on this side 4/5; the area between the right first and second toes lateral aspect  of the first toe looks better. Dorsal aspect of the left first toe on the left foot also improved. Unfortunately the left lateral lower leg is larger and there is [name] second satellite wound superiorly. The usual superficial abrasions on the left buttock overall better but certainly not closed 4/12; the area between the right first and second toes is improved. Dorsal aspect of the left foot also slightly smaller with [name] vibrant healthy looking surface. No real change in the left lateral leg and the left buttock wound is healed He has an unaffordable co-pay for Apligraf. Appointment with vein and vascular with regards to the left leg venous part of the circulation is on 4/21 4/19; we continue to see improvement in all wound areas. Although this is minor. He has his vascular appointment on 4/21. The area on the left buttock has not reopened although right in the center of this area the skin looks somewhat threatened 4/26; the left buttock is unfortunately reopened. In general his left dorsal foot has [name] healthy surface and looks somewhat smaller although it was not measured as such. The area between his first and second toe webspace on the right as [name] small wound against the first toe. The patient saw vascular surgery. The real question I was asking was about the small saphenous vein on the left. He has previously ablated left greater saphenous vein. Nothing further was commented on on the left. Right greater saphenous vein without reflux at the saphenofemoral junction or proximal thigh there was no indication for ablation of the right greater saphenous vein duplex was negative for DVT bilaterally. They did not think there was anything from [name] vascular surgery point of view that could be offered. They ABIs within normal limits 5/3; only small open area on the left buttock. The area on the left lateral leg which was his original venous reflux is now 2 wounds both which look clean. We are using Iodoflex on the  left dorsal foot which looks healthy and smaller. He is down to [name] very tiny area between the right first and second toes, using silver alginate 5/10; all of his wounds appear better. We have much better edema control in 4 layer compression on the left. This may be the factor that is allowing the left foot and left lateral calf to heal. He has external compression garments at home 04/14/20-All of his wounds are progressing well, the left forefoot is practically closed, left ischium appears to be about the same, right toe webspace is also smaller. The left lateral leg is about the same, continue using Hydrofera Blue to this, silver alginate to the ischium, Iodoflex to the toe space on the right 6/7; most of his wounds outside of the left buttock are doing well. The area on the left lateral calf and left dorsal foot are smaller. The area on the right foot in between the first and second toe webspace is barely visible although he still says there is some drainage here is the only reason I did not heal this out. Unfortunately the area on the left buttock almost looks like he has [name] skin tear from tape. He has open wound and then [name] large flap of skin that we are trying to get adherence over an area just next to the remaining wound 6/21;  2 week follow-up. I believe is been here for nurse visits. Miraculously the area between his first and second toes on the left dorsal foot is closed over. Still open on the right first second web space. The left lateral calf has 2 open areas. Distally this is more superficial. The proximal area had [name] little more depth and required debridement of adherent necrotic material. His buttock wound is actually larger we have been using silver alginate here 6/28; the patient's area on the left foot remains closed. Still open wet area between the first and second toes on the right and also extending into the plantar aspect. We have been using silver alginate in this location. He has 2  areas on the left lower leg part of his original long wounds which I think are better. We have been using Hydrofera Blue here. Hydrofera Blue to the left buttock which is stable 7/12; left foot remains closed. Left ankle is closed. May be [name] small area between his right first and second toes the only truly open area is on the left buttock. We have been using Hydrofera Blue here 7/19; patient arrives with marked deterioration especially in the left foot and ankle. We did not put him in [name] compression wrap on the left last week in fact he wore his juxta lite stockings on either side although he does not have an underlying stocking. He has [name] reopening on the left dorsal foot, left lateral ankle and [name] new area on the right dorsal ankle. More worrisome is the degree of erythema on the left foot extending on the lateral foot into the lateral lower leg on the left 7/26; the patient had erythema and drainage from the lateral left ankle last week. Culture of this grew MRSA resistant to doxycycline and clindamycin which are the 2 antibiotics we usually use with this patient who has multiple antibiotic allergies including linezolid, trimethoprim sulfamethoxazole. I had give him an empiric doxycycline and he comes in the area certainly looks somewhat better although it is blotchy in his lower leg. He has not been systemically unwell. He has had areas on the left dorsal foot which is [name] reopening, chronic wounds on the left lateral ankle. Both of these I think are secondary to chronic venous insufficiency. The area between his first and second toes is closed as far as I can tell. He had [name] new wrap injury on the right dorsal ankle last week. Finally he has an area on the left buttock. We have been using silver alginate to everything except the left buttock we are using Hydrofera Blue 06/30/20-Patient returns at 1 week, has been given [name] sample dose pack of NUZYRA which is [name] tetracycline derivative [omadacycline], patient  has completed those, we have been using silver alginate to almost all the wounds except the left ischium where we are using Hydrofera Blue all of them look better 8/16; since I last saw the patient he has been doing well. The area on the left buttock, left lateral ankle and left foot are all closed today. He has completed the [place] I gave him last time and tolerated this well. He still has open areas on the right dorsal ankle and in the right first second toe area which we are using silver alginate. 8/23; we put him in his bilateral external compression stockings last week as he did not have anything open on either leg except for concerning area between the right first and second toe. He comes in today with an  area on the left dorsal foot slightly more proximal than the original wound, the left lateral foot but this is actually [name] continuation of the area he had on the left lateral ankle from last time. As well he is opened up on the left buttock again. 8/30; comes in today with things looking [name] lot better. The area on the left lower ankle has closed down as has the left foot but with eschar in both areas. The area on the dorsal right ankle is also epithelialized. Very little remaining of the left buttock wound. We have been using silver alginate on all wound areas 9/13; the area in the first second toe webspace on the right has fully epithelialized. He still has some vulnerable epithelium on the right and the ankle and the dorsal foot. He notes weeping. He is using his juxta lite stocking. On the left again the left dorsal foot is closed left lateral ankle is closed. We went to the juxta lite stocking here as well. Still vulnerable in the left buttock although only 2 small open areas remain here 9/27; 2-week follow-up. We did not look at his left leg but the patient says everything is closed. He is [name] bit disturbed by the amount of edema in his left foot he is using juxta lite stockings but asking  about over the toes stockings which would be 30/40, will talk to him next time. According to him there is no open wound on either the left foot or the left ankle/calf He has an open area on the dorsal right calf which I initially point [name] wrap injury. He has superficial remaining wound on the left ischial tuberosity been using silver alginate although he says this sticks to the wound 10/5; we gave him 2-week follow-up but he called yesterday expressing some concerns about his right foot right ankle and the left buttock. He came in early. There is still no open areas on the left leg and that still in his juxta lite stocking 10/11; he only has 1 small area on the left buttock that remains measuring millimeters 1 mm. Still has the same irritated skin in this area. We recommended zinc oxide when this eventually closes and pressure relief is meticulously is he can do this. He still has an area on the dorsal part of his right first through third toes which is [name] bit irritated and still open and on the dorsal ankle near the crease of the ankle. We have been using silver alginate and using his own stocking. He has nothing open on the left leg or foot 10/25; 2-week follow-up. Not nearly as good on the left buttock as I was hoping. For open areas with 5 looking threatened small. He has the erythematous irritated chronic skin in this area. 1 area on the right dorsal ankle. He reports this area bleeds easily Right dorsal foot just proximal to the base of his toes We have been using silver alginate. 11/8; 2-week follow-up. Left buttock is about the same although I do not think the wounds are in the same location we have been using silver alginate. I have asked him to use zinc oxide on the skin around the wounds. He still has [name] small area on the right dorsal ankle he reports this bleeds easily Right dorsal foot just proximal to the base of the toes does not have anything open although the skin is very dry and  scaly He has [name] new opening on the nailbed of the left great  toe. Nothing on the left ankle 11/29; 3-week follow-up. Left buttock has 2 open areas. And washing of these wounds today started bleeding easily. Suggesting very friable tissue. We have been using silver alginate. Right dorsal ankle which I thought was initially [name] wrap injury we have been using silver alginate. Nothing open between the toes that I can see. He states the area on the left dorsal toe nailbed healed after the last visit in 2 or 3 days 12/13; 3-week follow-up. His left buttock now has 3 open areas but the original 2 areas are smaller using polymen here. Surrounding skin looks better. The right dorsal ankle is closed. He has [name] small opening on the right dorsal foot at the level of the third toe. In general the skin looks better here. He is wearing his juxta lite stocking on the left leg says there is nothing open 11/24/2020; 3 [name] follow-up. His left buttock still has the 3 open areas. We have been using polymen but due to lack of response he changed to [facility] area. Surrounding skin is dry erythematous and irritated looking. There is no evidence of infection either bacterial or fungal however there is loss of surface epithelium He still has very dry skin in his foot causing irritation and erythema on the dorsal part of his toes. This is not responded to prolonged courses of antifungal simply looks dry and irritated 1/24; left buttock area still looks about the same he was unable to find the triad ointment that we had suggested. The area on the right lower leg just above the dorsal ankle has reopened and the areas on the right foot between the first second and second third toes and scaling on the bottom of the foot has been about the same for quite some time now. been using silver alginate to all wound areas 2/7; left buttock wound looked quite good although not much smaller in terms of surface area surrounding skin looks  better. Only [name] few dry flaking areas on the right foot in between the first and second toes the skin generally looks better here [ammonium lactate]. Finally the area on the right dorsal ankle is closed 2/21; There is no open area on the right foot even between the right first and second toe. Skin around this area dorsally and plantar aspects look better. He has [name] reopening of the area on the right ankle just above the crease of the ankle dorsally. I continue to think that this is probably friction from spasms may be even this time with his stocking under the compression stockings. Wounds on his left buttock look about the same there [name] couple of areas that have reopened. He has [name] total square area of loss of epithelialization. This does not look like infection it looks like [name] contact dermatitis but I just cannot determine to what 3/14; there is nothing on the right foot between the first and second toes this was carefully inspected under illumination. Some chronic irritation on the dorsal part of his foot from toes 1-3 at the base. Nothing really open here substantially. Still has an area on the right foot/ankle that is actually larger and hyper granulated. His buttock area on the left is just about closed however he has chronic inflammation with loss of the surface epithelial layer 3/28; 2-week follow-up. In clinic today with [name] new wound on the left anterior mid tibia. Says this happened about 2 [name] ago. He is not really sure how wonders about the spasticity of  his legs at night whether that could have caused this other than that he does not have [name] good idea. He has been using topical antibiotics and silver alginate. The area on his right dorsal ankle seems somewhat better. Finally everything on his left buttock is closed. 4/11; 2-week follow-up. All of his wounds are better except for the area over the ischium and left buttock which have opened up widely again. At least part of this is covered in  necrotic fibrinous material another part had rolled nonviable skin. The area on the right ankle, left anterior mid tibia are both [name] lot better. He had no open wounds on either foot including the areas between the first and second toes 4/25; patient presents for 2-week follow-up. He states that the wounds are overall stable. He has no complaints today and states he is using Hydrofera Blue to open wounds. 5/9; have not seen this man in over [name] month. For my memory he has open areas on the left mid tibia and right ankle. T oday he has new open area on the right dorsal foot which we have not had [name] problem with recently. He has the sustained area on the left buttock He is also changed his insurance at the beginning of the year [employer]. We will need prior authorizations for debridement 5/23; patient presents for 2-week follow-up. He has prior authorizations for debridement. He denies any issues in the past 2 [name] with his wound care. He has been using Hydrofera Blue to all the wounds. He does report [name] circular rash to the upper left leg that is new. He denies acute signs of infection. 6/6; 2-week follow-up. The patient has open wounds on the left buttock which are worse than the last time I saw this about [name] month ago. He also has [name] new area to me on the left anterior mid tibia with some surrounding erythema. The area on the dorsal ankle on the right is closed but I think this will be [name] friction injury every time this area is exposed to either our wraps or his compression stockings caused by unrelenting spasms in this leg. 6/20; 2-week follow-up. The patient has open wounds on the left buttock which is about the same. Using [facility] here. - The left mid tibia has [name] static amount of surrounding erythema. Also [name] raised area in the center. We have been using Hydrofera Blue here. Finally he has broken down in his dorsal right foot extending between the first and second toes and going to the base of  the first and second toe webspace. I have previously assumed that this was severe venous hypertension 7/5; 2-week follow-up The left buttock wound actually looks better. We are using Hydrofera Blue. He has extensive skin irritation around this area and I have not really been able to get that any better. I have tried Lotrisone i.e. antifungals and steroids. More most recently we have just been using Coloplast really looks about the same. The left mid tibia which was new last week culture to have very resistant staph aureus. Not only methicillin-resistant but doxycycline resistant. The patient has [name] plethora of antibiotic allergies including sulfa, linezolid. I used topical bacitracin on this but he has not started this yet. In addition he has an expanding area of erythema with [name] wound on the dorsal right foot. I did [name] deep tissue culture of this area today 7/12; Left buttock area actually looks better surrounding skin also looks less irritated. Left mid tibia  looks about the same. He is using bacitracin this is not worse Right dorsal foot looks about the same as well. The left first toe also looks about the same 7/19; left buttock wound continues to improve in terms of open areas Left mid tibia is still concerning amount of swelling he is using bacitracin Dorsal left first toe somewhat smaller Right dorsal foot somewhat smaller 7/25; left buttock wound actually continues to improve Left mid tibia area has less swelling. I gave him all my samples of new Nuzyra. This seems to have helped although the wound is still open it. His abrasion closed by here Left dorsal great toe really no better. Still [name] very nonviable surface Right dorsal foot perhaps some better. We have been using bacitracin and silver nitrate to the areas on his lower legs and Hydrofera Blue to the area on the buttock. 8/16 Disappointed that his left buttock wound is actually more substantial. Apparently during the last nurse  visit these were both very small. He has continued irritation to [name] large area of skin on his buttock. I have never been able to totally explain this although I think it some combination of the way he sits, pressure, moisture. He is not incontinent enough to contribute to this. Left dorsal great toe still fibrinous debris on the surface that I have debrided today Large area across the dorsal right toes. The area on the left anterior mid tibia has less swelling. He completed the [place]. This does not look infected although the tissue is still fried 8/30; 2-week follow-up. Left buttock areas not improved. We used Hydrofera Blue on this. Weeping wet with the surrounding erythema that I have not been able to control even with Lotrisone and topical Coloplast Left dorsal great toe looks about the same More substantial area again at the base of his toes on the left which is new this week. Area across the dorsal right toes looks improved The left anterior mid tibia looks like it is trying to close 9/13; 2-week follow-up. Using silver alginate on all of his wounds. The left dorsal foot does not look any better. He has the area on the dorsal toe and also the areas at the base of all of his toes 1 through 3. On the right foot he has [name] similar pattern in [name] similar area. He has the area on his left mid tibia that looks fairly healthy. Finally the area on his left buttock looks somewhat bette 9/20; culture I did of the left foot which was [name] deep tissue culture last time showed E. coli he has erythema around this wound. Still [name] completely necrotic surface. His right dorsal foot looks about the same. He has [name] very friable surface to the left anterior mid tibia. Both buttock wounds look better. We have been using silver alginate to all wounds 10/4; he has completed the cephalexin that I gave him last time for the left foot. He is using topical gentamicin under silver alginate silver alginate being applied to all  the wounds. Unfortunately all the wounds look irritated on his dorsal right foot dorsal left foot left mid tibia. I wonder if this could be [name] silver allergy. I am going to change him to [facility] on the lower extremity. The skin on the left buttock and left posterior thigh still flaking dry and irritated. This has continued no matter what I have applied topically to this. He has [name] solitary open wound which by itself does not look too bad  however the entire area of surrounding skin does not change no matter what we have applied here 10/18; the area on the left dorsal foot and right dorsal foot both look better. The area on the right extends into the plantar but not between his toes. We have been using silver alginate. He still has [name] rectangular erythematous area around the area on the left tibia. The wound itself is very small. Finally everything on his left buttock looks [name] little larger the skin is erythematous 11/15; patient comes in with the left dorsal and right dorsal foot distally looking somewhat better. Still nonviable surface on the left foot which required debridement. He still has the area on the left anterior mid tibia although this looks somewhat better. He has [name] new area on the right lateral lower leg just above the ankle. Finally his left buttock looks terrible with multiple superficial open wounds geometric square shaped area of chronic erythema which I have not been able to sort through 11/29; right dorsal foot and left dorsal foot both look somewhat better. No debridement required. He has the fragile area on the left anterior mid tibia this looks and continues to look somewhat better. Right lateral lower leg just above the ankle we identified last time also looks better. In general the area on his left buttock looks improved. We are using Hydrofera Blue to all wound areas 12/13; right dorsal foot looks better. The area on the right lateral leg is healed. Left dorsal foot has 2  open areas both of which require debridement. The fragile area on the left anterior mid tibial looks better. Smaller area on his buttocks. Were using Hydrofera Blue 1/10; patient comes in with everything looking slightly larger and/or worse. This includes his left buttock, reopening of the left mid tibia, larger areas on the left dorsal foot and what looks to be [name] cellulitis on the right dorsal and plantar foot. We have been using Hydrofera Blue on all wounds. 1/17; right dorsal foot distally looks better today. The left foot has 2 open wounds that are about the same surrounding erythema. 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 No malignancy they did not stain for fungus 1/24; his right distal foot is not closed dry and scaly but the wound looks like it is contracting. I did not debride anything here. Problem on his left dorsal foot with expanding erythema. Apparently there were problems last week getting the Elesa Hacker however it is now available at the [employer] but [name] week later. He is using ketoconazole and Coloplast to the left buttock along with Hydrofera Blue this actually looks quite [name] bit better today. 1/31; right dorsal foot again is dry and scaly but looking to contract. He has been using [name] moisturizer on his feet at my request but he is not sure which 1. The left dorsal foot wounds look about the same there is erythema here that I marked last week however after course of Nuzyra it certainly is not any better but not any worse either. Finally on his left buttock the skin continues to look better he has the original wound but [name] new substantial area towards the gluteal cleft. Almost like [name] skin tear. I used scissors to remove skin and subcutaneous tissue here silver nitrate and direct pressure 2/7; right dorsal foot. This does not look too much  different from last week. Some erythema  skin dry and scaly. No debridement. Left dorsal great toe again still not much improvement. I did remove flaking dry skin and callus from around the edge. Finally on his left buttock. The skin is somewhat better in the periwound. Surface wounds are superficial somewhat better than last week. 01/26/2022: Is [name] little bit of [name] mystery as to why his wounds fail to respond to treatments and actually seem to get worse. This is my first encounter with this patient. He was previously followed by Dr. Dellia Nims. Based upon my review of the chart, it seems that there is [name] little bit of [name] mystery as to why his wounds do not respond as anticipated to the interventions applied and sometimes even get worse. Biopsies have been performed and he was seen by dermatology in [place], but that did not shed any light on the matter. T oday, his gluteal wound is larger, with substantial drainage, rather malodorous. The food wounds are not terrible, but he has [name] lot of callus and scaly skin around these. He is currently getting silver alginate on the gluteal wound, with idodoflex to the feet. He is using lotrisone on his legs for the dry, scaly skin. 02/09/2022: There has really been no change to any of his wounds. The gluteal wound less drainage and odor, but remains about the same size, the periwound skin remains oddly scaly. His lower extremity wounds also appear roughly the same size. They continue to accumulate [name] small amount of slough. The periwound on his feet and ankle wounds has dry eschar and loose dead skin. We have been using silver alginate on the gluteal wound and Iodoflex on his feet and ankle wounds. T the periwound around his gluteal lesion and Lotrisone on his feet and legs. o 02/23/2022: The right plantar foot wound is closed. The gluteal site looks small but has continued to produce hypertrophic granulation tissue. The foot wounds all look about the same on the dorsal  surface of the right foot; on the left, there is only [name] small open area at the site of where his left great toenail would have been. 03/16/2022: The right ankle wound is healed. The right dorsal foot wound is about the same. The left dorsal foot wound is quite [name] bit smaller and the ischial wound is nearly closed. 03/30/2022: The right ankle wound reopened. Both dorsal foot wounds are quite [name] bit smaller. Unfortunately, he appears to have sheared part of his ischial wound open further, perhaps during [name] transfer. 04/13/2022: The right ankle wound has hypertrophic granulation tissue present. The dorsal foot wounds continue to decrease in size. The ischial wound looks about the same today, no better, no worse. 04/27/2022: The right ankle wound has closed. Unfortunately, it looks like some moisture got underneath the dry skin on both of his dorsal feet and these wounds have expanded in size. The ischial wound remains the same with perhaps [name] little bit more slough accumulation than at our previous visit. 05/11/2022: The right ankle wound remains closed. There is [name] left anterior tibial wound that is small has patchy openings with accumulated slough. The dorsum of his right foot appears to be nearly healed with just [name] small punctate opening. The plantar surface of his right foot has [name] new opening that looks like he may have picked some skin there. His sacral ulcer has hypertrophic granulation tissue but has some slough accumulation. The dorsum of his left foot has multiple open areas in [name] fairly ragged distribution. All of these have  slough accumulated within them. 06/01/2022: The right ankle and left anterior tibial wound are both closed. Dorsum of his right foot and left foot both look substantially better with just tiny scattered openings The without any slough accumulation. He has sheared open new areas on his left gluteus and ischium. He says that his wheelchair cushion, which is air-filled, has [name] leak and so  it keeps deflating. He is awaiting [name] new cushion. Electronic Signature(s) Signed: 06/01/2022 10:36:01 AM By: [name] Maudlin MD FACS Entered By: [name] on 06/01/2022 10:36:01 -------------------------------------------------------------------------------- Physical Exam Details Patient Name: Date of Service: [name], [name] 06/01/2022 8:15 [name] Medical Record Number: [id number] Patient Account Number: [id number] Date of Birth/Sex: Treating RN: May 16, 1986 (36 y.o. [name] Primary Care Provider: [place], [place] Other Clinician: Referring Provider: Treating Provider/Extender: Graciela Husbands, [name] [name] in Treatment: 334 Constitutional No acute distress.Marland Kitchen Respiratory Normal work of breathing on room air.. Notes 06/01/2022: The right ankle and left anterior tibial wound are both closed. Dorsum of his right foot and left foot both look substantially better with just tiny scattered openings The without any slough accumulation. He has sheared open new areas on his left gluteus and ischium Electronic Signature(s) Signed: 06/01/2022 10:36:39 AM By: [name] Maudlin MD FACS Entered By: [name] on 06/01/2022 10:36:39 -------------------------------------------------------------------------------- Physician Orders Details Patient Name: Date of Service: [name], [name] 06/01/2022 8:15 [name] Medical Record Number: [id number] Patient Account Number: [id number] Date of Birth/Sex: Treating RN: 11/26/1985 (36 y.o. 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 - Dr Celine Ahr - Room 1 Return [name] Bathing/ Shower/ Hygiene May shower and wash wound with soap and water. - on days that dressing is changed Edema Control - Lymphedema / SCD / Other Elevate legs to the level of the heart or above for 30 minutes daily and/or when sitting, [name] frequency of: - throughout the day Moisturize legs daily. 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 Peri-Wound Care: Zinc Oxide Ointment 30g tube Every Other Day/30 Days Discharge Instructions: Apply Zinc Oxide to periwound with each dressing change Prim Dressing: KerraCel Ag Gelling Fiber Dressing, 4x5 in (silver alginate) (Generic) Every Other Day/30 Days ary Discharge Instructions: Apply silver alginate to wound bed as instructed Secondary Dressing: ALLEVYN Gentle Border, 4x4 (in/in) Every Other Day/30 Days Discharge Instructions: Apply over primary dressing as directed. Wound #52 - Foot Wound Laterality: Dorsal, Right Cleanser: Soap and Water Every Other Day/30 Days Discharge Instructions: May shower and wash wound with dial antibacterial soap and water prior to dressing change. Peri-Wound  Care: Triamcinolone 15 (g) Every Other Day/30 Days Discharge Instructions: Use triamcinolone 15 (g) as directed Peri-Wound Care: Sween  Lotion (Moisturizing lotion) Every Other Day/30 Days Discharge Instructions: Apply Aquaphor moisturizing lotion as directed Peri-Wound Care: Lotrisone Every Other Day/30 Days Discharge Instructions: Apply Lotrisone to periwound Prim Dressing: KerraCel Ag Gelling Fiber Dressing, 2x2 in (silver alginate) Every Other Day/30 Days ary Discharge Instructions: Apply silver alginate to wound bed as instructed Secondary Dressing: Woven Gauze Sponge, Non-Sterile 4x4 in Every Other Day/30 Days Discharge Instructions: Apply over primary dressing as directed. Secured With: [employer], 4.5x3.1 (in/yd) (Generic) Every Other Day/30 Days Discharge Instructions: Secure with Kerlix as directed. Secured With: 46M Medipore H Soft Cloth Surgical T ape, 4 x 10 (in/yd) (Generic) Every Other Day/30 Days Discharge Instructions: Secure with tape as directed. Wound #56 - Foot Wound Laterality: Dorsal, Left Cleanser: Soap and Water Every Other Day/30 Days Discharge Instructions: May shower and wash wound with dial antibacterial soap and water prior to dressing change. Peri-Wound Care: Triamcinolone 15 (g) Every Other Day/30 Days Discharge Instructions: Use triamcinolone 15 (g) as directed Peri-Wound Care: Sween Lotion (Moisturizing lotion) Every Other Day/30 Days Discharge Instructions: Apply Aquaphor moisturizing lotion as directed Peri-Wound Care: Lotrisone Every Other Day/30 Days Discharge Instructions: Apply Lotrisone to periwound Prim Dressing: KerraCel Ag Gelling Fiber Dressing, 2x2 in (silver alginate) Every Other Day/30 Days ary Discharge Instructions: Apply silver alginate to wound bed as instructed Secondary Dressing: Woven Gauze Sponge, Non-Sterile 4x4 in Every Other Day/30 Days Discharge Instructions: Apply over primary dressing as directed. Secured With: [employer], 4.5x3.1 (in/yd) (Generic) Every Other Day/30 Days Discharge Instructions: Secure with Kerlix as directed. Secured With: 46M  Medipore H Soft Cloth Surgical T ape, 4 x 10 (in/yd) (Generic) Every Other Day/30 Days Discharge Instructions: Secure with tape as directed. Wound #64 - Foot Wound Laterality: Plantar, Right Cleanser: Soap and Water Every Other Day/30 Days Discharge Instructions: May shower and wash wound with dial antibacterial soap and water prior to dressing change. Peri-Wound Care: Triamcinolone 15 (g) Every Other Day/30 Days Discharge Instructions: Use triamcinolone 15 (g) as directed Peri-Wound Care: Sween Lotion (Moisturizing lotion) Every Other Day/30 Days Discharge Instructions: Apply Aquaphor moisturizing lotion as directed Peri-Wound Care: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 on the right 04/14/17; arrives today with [name] healthier looking wound bed contractions and the top 50% of this wound and some on the lesser 50%. Wound bed appears healthy. The area between the first and second toe on the right still remains problematic 04/21/17; continued very gradual improvement. Using [facility] 04/28/17; continued very gradual improvement in the left lateral leg venous insufficiency wound. His periwound erythema is very mild. We have been using Hydrofera Blue. Wound is making progress especially in the superior 50% 05/05/17; he continues to have very gradual improvement in the left lateral venous insufficiency wound. Both in terms with an length rings are improving. I debrided this every 2 [name] with #5 curet and we have been using Hydrofera Blue and again making good progress With  regards to the wounds between his right first and second toe which I thought might of been tinea pedis he is not making as much progress very dry scaly skin over the area. Also the area at the base of the left first and second toe in [name] similar condition 05/12/17; continued gradual improvement in the refractory left lateral venous insufficiency wound on the left. Dimension smaller. Surface still requiring debridement using Hydrofera Blue 05/19/17; continued gradual improvement in the refractory left lateral venous ulceration. Careful inspection of the wound bed underlying rumination suggested some degree of epithelialization over the surface no debridement indicated. Continue Hydrofera Blue difficult areas between his toes first and third on the left than first and second on the right. I'm going to change to silver alginate from silver collagen. Continue ketoconazole as I suspect underlying tinea pedis 05/26/17; left lateral leg venous insufficiency wound. We've been using Hydrofera Blue. I believe that there is expanding epithelialization over the surface of the wound albeit not coming from the wound circumference. This is [name] bit of an odd situation in which the epithelialization seems to be coming from the surface of the wound rather than in the exact circumference. There is still small open areas mostly along the lateral margin of the wound. ooHe has unchanged areas between the left first and second and the right first second toes which I been treating for tenia pedis 06/02/17; left lateral leg venous insufficiency wound. We have been using Hydrofera Blue. Somewhat smaller from the wound circumference. The surface of the wound remains [name] bit on it almost epithelialized sedation in appearance. I use an open curette today debridement in the surface of all of this especially the edges ooSmall open wounds remaining on the dorsal right first and second toe interspace and the plantar left first second toe and  her face on the left 06/09/17; wound on the left lateral leg continues to be smaller but very gradual and very dry surface using Hydrofera Blue 06/16/17 requires weekly debridements now on the left lateral leg although this continues to  contract. I changed to silver collagen last week because of dryness of the wound bed. Using Iodoflex to the areas on his first and second toes/web space bilaterally 06/24/17; patient with history of paraplegia also chronic venous insufficiency with lymphedema. Has [name] very difficult wound on the left lateral leg. This has been gradually reducing in terms of with but comes in with [name] very dry adherent surface. High switch to silver collagen [name] week or so ago with hydrogel to keep the area moist. This is been refractory to multiple dressing attempts. He also has areas in his first and second toes bilaterally in the anterior and posterior web space. I had been using Iodoflex here after [name] prolonged course of silver alginate with ketoconazole was ineffective [question tinea pedis] 07/14/17; patient arrives today with [name] very difficult adherent material over his left lateral lower leg wound. He also has surrounding erythema and poorly controlled edema. He was switched his Santyl last visit which the nurses are applying once during his doctor visit and once on [name] nurse visit. He was also reduced to 2 layer compression I'm not exactly sure of the issue here. 07/21/17; better surface today after 1 week of Iodoflex. Significant cellulitis that we treated last week also better. [Doxycycline] 07/28/17 better surface today with now 2 [name] of Iodoflex. Significant cellulitis treated with doxycycline. He has now completed the doxycycline and he is back to his usual degree of chronic venous inflammation/stasis dermatitis. He reminds me he has had ablations surgery here 08/04/17; continued improvement with Iodoflex to the left lateral leg wound in terms of the surface of the wound although the  dimensions are better. 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 Problematic areas on the plantar aspect of the first second toe web space on the left and the dorsal aspect of the first second toe web space on the right. At one point I felt these were probably related to chronic fungal infections in treated him aggressively for this although we have not made any improvement here. 08/11/17; left lateral leg. Surface continues to improve with the Iodoflex although we are not seeing much improvement in overall wound dimensions. Areas on his plantar left foot and right foot show no improvement. In fact the right foot looks somewhat worse 08/18/17; left lateral leg. We changed to [facility] Blue last week after [name] prolonged course of Iodoflex which helps get the surface better. It appears that the wound with is improved. Continue with difficult areas on the left dorsal first second and plantar first second on the right 09/01/17; patient arrives in clinic today having had [name] temperature of 103 yesterday. He was seen in the ER and [facility]. The patient was concerned he could have cellulitis again in the right leg however they diagnosed him with [name] UTI and he is now on Keflex. He has [name] history of cellulitis which is been recurrent and difficult but this is been in the left leg, in the past 5 use doxycycline. He does in and out catheterizations at home which are risk factors for UTI 09/08/17; patient will be completing his Keflex this weekend. The erythema on the left leg is considerably better. He has [name] new wound today on the medial part of the right leg small superficial almost looks like [name] skin tear. He has worsening of the area on the right dorsal first and second toe. His major area on the left lateral leg is better. Using [facility]  Blue on all areas 09/15/17; gradual reduction in width on the long wound in the left lateral leg. No  debridement required. He also has wounds on the plantar aspect of his left first second toe web space and on the dorsal aspect of the right first second toe web space. 09/22/17; there continues to be very gradual improvements in the dimensions of the left lateral leg wound. He hasn't round erythematous spot with might be pressure on his wheelchair. There is no evidence obviously of infection no purulence no warmth ooHe has [name] dry scaled area on the plantar aspect of the left first second toe ooImproved area on the dorsal right first second toe. 09/29/17; left lateral leg wound continues to improve in dimensions mostly with an is still [name] fairly long but increasingly narrow wound. ooHe has [name] dry scaled area on the plantar aspect of his left first second toe web space ooIncreasingly concerning area on the dorsal right first second toe. In fact I am concerned today about possible cellulitis around this wound. The areas extending up his second toe and although there is deformities here almost appears to abut on the nailbed. 10/06/17; left lateral leg wound continues to make very gradual progress. Tissue culture I did from the right first second toe dorsal foot last time grew MRSA and enterococcus which was vancomycin sensitive. This was not sensitive to clindamycin or doxycycline. He is allergic to Zyvox and sulfa we have therefore arrange for him to have dalvance infusion tomorrow. He is had this in the past and tolerated it well 10/20/17; left lateral leg wound continues to make decent progress. This is certainly reduced in terms of with there is advancing epithelialization.ooThe cellulitis in the right foot looks better although he still has [name] deep wound in the dorsal aspect of the first second toe web space. Plantar left first toe web space on the left I think is making some progress 10/27/17; left lateral leg wound continues to make decent progress. Advancing epithelialization.using Hydrofera  Blue ooThe right first second toe web space wound is better-looking using silver alginate ooImprovement in the left plantar first second toe web space. Again using silver alginate 11/03/17 left lateral leg wound continues to make decent progress albeit slowly. Using Hydrofera Blue ooThe right per second toe web space continues to be [name] very problematic looking punched out wound. I obtained [name] piece of tissue for deep culture I did extensively treated this for fungus. It is difficult to imagine that this is [name] pressure area as the patient states other than going outside he doesn't really wear shoes at home ooThe left plantar first second toe web space looked fairly senescent. Necrotic edges. This required debridement oochange to Hydrofera Blue to all wound areas 11/10/17; left lateral leg wound continues to contract. Using Hydrofera Blue ooOn the right dorsal first second toe web space dorsally. Culture I did of this area last week grew MRSA there is not an easy oral option in this patient was multiple antibiotic allergies or intolerances. This was only [name] rare culture isolate I'm therefore going to use Bactroban under silver alginate ooOn the left plantar first second toe web space. Debridement is required here. This is also unchanged 11/17/17; left lateral leg wound continues to contract using Hydrofera Blue this is no longer the major issue. ooThe major concern here is the right first second toe web space. He now has an open area going from dorsally to the plantar aspect. There is now wound on the inner  lateral part of the first toe. Not [name] very viable surface on this. There is erythema spreading medially into the forefoot. ooNo major change in the left first second toe plantar wound 11/24/17; left lateral leg wound continues to contract using Hydrofera Blue. Nice improvement today ooThe right first second toe web space all of this looks [name] lot less angry than last week. I have given him  clindamycin and topical Bactroban for MRSA and terbinafine for the possibility of underlining tinea pedis that I could not control with ketoconazole. Looks somewhat better ooThe area on the plantar left first second toe web space is weeping with dried debris around the wound 12/01/17; left lateral leg wound continues to contract he Hydrofera Blue. It is becoming thinner in terms of with nevertheless it is making good improvement. ooThe right first second toe web space looks less angry but still [name] large necrotic-looking wounds starting on the plantar aspect of the right foot extending between the toes and now extensively on the base of the right second toe. I gave him clindamycin and topical Bactroban for MRSA anterior benefiting for the possibility of underlying tinea pedis. Not looking better today ooThe area on the left first/second toe looks better. Debrided of necrotic debris 12/05/17* the patient was worked in urgently today because over the weekend he found blood on his incontinence bad when he woke up. He was found to have an ulcer by his wife who does most of his wound care. He came in today for [place] to look at this. He has not had [name] history of wounds in his buttocks in spite of his paraplegia. 12/08/17; seen in follow-up today at his usual appointment. He was seen earlier this week and found to have [name] new wound on his buttock. We also follow him for wounds on the left lateral leg, left first second toe web space and right first second toe web space 12/15/17; we have been using Hydrofera Blue to the left lateral leg which has improved. The right first second toe web space has also improved. Left first second toe web space plantar aspect looks stable. The left buttock has worsened using Santyl. Apparently the buttock has drainage 12/22/17; we have been using Hydrofera Blue to the left lateral leg which continues to improve now 2 small wounds separated by normal skin. He tells [place] he had [name] fever up  to 100 yesterday he is prone to UTIs but has not noted anything different. He does in and out catheterizations. The area between the first and second toes today does not look good necrotic surface covered with what looks to be purulent drainage and erythema extending into the third toe. I had gotten this to something that I thought look better last time however it is not look good today. He also has [name] necrotic surface over the buttock wound which is expanded. I thought there might be infection under here so I removed [name] lot of the surface with [name] #5 curet though nothing look like it really needed culturing. He is been using Santyl to this area 12/27/17; his original wound on the left lateral leg continues to improve using Hydrofera Blue. I gave him samples of Baxdella although he was unable to take them out of fear for an allergic reaction ["lump in his throat"].the culture I did of the purulent drainage from his second toe last week showed both enterococcus and [name] set Enterobacter I was also concerned about the erythema on the bottom of his foot although  paradoxically although this looks somewhat better today. 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 We've been using silver alginate here. They came up today that he sleeps in [name] recliner, I'm not sure why but I asked him to stop this 01/03/18; his original wound we've been using Hydrofera Blue is now separated into 2 areas. ooUlcer on his left buttock is better he is off the recliner and sleeping in bed ooFinally both wound areas between his first and second toes also looks some better 01/10/18; his original wound on the left lateral leg is now separated into 2 wounds we've been using Hydrofera Blue ooUlcer on his left buttock has some drainage. There is [name] small probing site going into muscle layer superiorly.using silver alginate -He arrives today with [name] deep  tissue injury on the left heel ooThe wound on the dorsal aspect of his first second toe on the left looks [name] lot betterusing silver alginate ketoconazole ooThe area on the first second toe web space on the right also looks [name] lot bette 01/17/18; his original wound on the left lateral leg continues to progress using Hydrofera Blue ooUlcer on his left buttock also is smaller surface healthier except for [name] small probing site going into the muscle layer superiorly. 2.4 cm of tunneling in this area ooDTI on his left heel we have only been offloading. Looks better than last week no threatened open no evidence of infection oothe wound on the dorsal aspect of the first second toe on the left continues to look like it's regressing we have only been using silver alginate and terbinafine orally ooThe area in the first second toe web space on the right also looks to be [name] lot better using silver alginate and terbinafine I think this was prompted by tinea pedis 01/31/18; the patient was hospitalized in [place] last week apparently for [name] complicated UTI. He was discharged on cefepime he does in and out catheterizations. In the hospital he was discovered M I don't mild elevation of AST and ALT and the terbinafine was stopped.predictably the pressure ulcer on s his buttock looks betterusing silver alginate. The area on the left lateral leg also is better using Hydrofera Blue. The area between the first and second toes on the left better. First and second toes on the right still substantial but better. Finally the DTI on the left heel has held together and looks like it's resolving 02/07/18-he is here in follow-up evaluation for multiple ulcerations. He has new injury to the lateral aspect of the last issue [name] pressure ulcer, he states this is from adhesive removal trauma. He states he has tried multiple adhesive products with no success. All other ulcers appear stable. The left heel DTI is resolving. We will  continue with same treatment plan and follow-up next week. 02/14/18; follow-up for multiple areas. ooHe has [name] new area last week on the lateral aspect of his pressure ulcer more over the posterior trochanter. The original pressure ulcer looks quite stable has healthy granulation. 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 Almost fully epithelialized on the original superior area using Hydrofera Blue ooDTI on the left heel has peeled off this week to reveal [name] small superficial wound under denuded skin and subcutaneous tissue ooBoth areas between the first and second toes look better including nothing open on the  left 02/21/18; ooThe patient's wounds on his left ischial tuberosity and posterior left greater trochanter actually looked better. He has [name] large area of irritation around the area which I think is contact dermatitis. I am doubtful that this is fungal ooHis original wound on the left lateral calf continues to improve we have been using Hydrofera Blue ooThere is no open area in the left first second toe web space although there is [name] lot of thick callus ooThe DTI on the left heel required debridement today of necrotic surface eschar and subcutaneous tissue using silver alginate ooFinally the area on the right first second toe webspace continues to contract using silver alginate and ketoconazole 02/28/18 ooLeft ischial tuberosity wounds look better using silver alginate. ooOriginal wound on the left calf only has one small open area left using Hydrofera Blue ooDTI on the left heel required debridement mostly removing skin from around this wound surface. Using silver alginate ooThe areas on the right first/second toe web space using silver alginate and ketoconazole 03/08/18 on evaluation today patient appears to be doing decently well as best I can tell in regard to his wounds. This  is the first time that I have seen him as he generally is followed by Dr. Dellia Nims. With that being said none of his wounds appear to be infected he does have an area where there is some skin covering what appears to be [name] new wound on the left dorsal surface of his great toe. This is right at the nail bed. With that being said I do believe that debrided away some of the excess skin can be of benefit in this regard. Otherwise he has been tolerating the dressing changes without complication. 03/14/18; patient arrives today with the multiplicity of wounds that we are following. He has not been systemically unwell ooOriginal wound on the left lateral calf now only has 2 small open areas we've been using Hydrofera Blue which should continue ooThe deep tissue injury on the left heel requires debridement today. We've been using silver alginate ooThe left first second toe and the right first second toe are both are reminiscence what I think was tinea pedis. Apparently some of the callus Surface between the toes was removed last week when it started draining. ooPurulent drainage coming from the wound on the ischial tuberosity on the left. 03/21/18-He is here in follow-up evaluation for multiple wounds. 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 He tolerated debridement. The only change to last week's recommendations is to discontinue antifungal cream between toes. He will follow-up next week 03/28/18; following up for multiple wounds;Concern this week is streaking redness and swelling in the right foot. He is going to need antibiotics for this. 03/31/18; follow-up for right foot cellulitis. Streaking redness and swelling in the right foot on 03/28/18. 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 I put him on clindamycin 300 mg every 6 and brought him in for [name] quick check. He has an open wound between his first and second  toes on the right foot as [name] potential source. 04/04/18; ooRight foot cellulitis is resolving he is completing clindamycin. This is truly good news ooLeft lateral calf wound which is initial wound only has one small open area inferiorly this is close to healing out. He has compression stockings. We will use Hydrofera Blue right down to the epithelialization of this ooNonviable surface on the left heel which was initially pressure with [name] DTI.  We've been using Hydrofera Blue. I'm going to switch this back to silver alginate ooLeft first second toe/tinea pedis this looks better using silver alginate ooRight first second toe tinea pedis using silver alginate ooLarge pressure ulcers on theLeft ischial tuberosity. Small wound here Looks better. I am uncertain about the surface over the large wound. Using silver alginate 04/11/18; ooCellulitis in the right foot is resolved ooLeft lateral calf wound which was his original wounds still has 2 tiny open areas remaining this is just about closed ooNonviable surface on the left heel is better but still requires debridement ooLeft first second toe/tinea pedis still open using silver alginate ooRight first second toe wound tinea pedis I asked him to go back to using ketoconazole and silver alginate ooLarge pressure ulcers on the left ischial tuberosity this shear injury here is resolved. Wound is smaller. No evidence of infection using silver alginate 04/18/18; ooPatient arrives with an intense area of cellulitis in the right mid lower calf extending into the right heel area. Bright red and warm. Smaller area on the left anterior leg. He has [name] significant history of MRSA. He will definitely need antibioticsoodoxycycline ooHe now has 2 open areas on the left ischial tuberosity the original large wound and now [name] satellite area which I think was above his initial satellite areas. Not [name] wonderful surface on this satellite area surrounding erythema  which looks like pressure related. ooHis left lateral calf wound again his original wound is just about closed ooLeft heel pressure injury still requiring debridement ooLeft first second toe looks [name] lot better using silver alginate ooRight first second toe also using silver alginate and ketoconazole cream also looks better 04/20/18; the patient was worked in early today out of concerns with his cellulitis on the right leg. I had started him on doxycycline. This was 2 days ago. His wife was concerned about the swelling in the area. Also concerned about the left buttock. He has not been systemically unwell no fever chills. No nausea vomiting or diarrhea 04/25/18; the patient's left buttock wound is continued to deteriorate he is using Hydrofera Blue. He is still completing clindamycin for the cellulitis on the right leg although all of this looks better. 05/02/18 ooLeft buttock wound still with [name] lot of drainage and [name] very tightly adherent fibrinous necrotic surface. He has [name] deeper area superiorly ooThe left lateral calf wound is still closed ooDTI wound on the left heel necrotic surface especially the circumference using Iodoflex ooAreas between his left first second toe and right first second toe both look better. Dorsally and the right first second toe he had [name] necrotic surface although at smaller. In using silver alginate and ketoconazole. I did [name] culture last week which was [name] deep tissue culture of the reminiscence of the open wound on the right first second toe dorsally. This grew [name] few Acinetobacter and [name] few methicillin-resistant staph aureus. Nevertheless the area actually this week looked better. I didn't feel the need to specifically address this at least in terms of systemic antibiotics. 05/09/18; wounds are measuring larger more drainage per our intake. We are using Santyl covered with alginate on the large superficial buttock wounds, Iodosorb on the left heel, ketoconazole and  silver alginate to the dorsal first and second toes bilaterally. 05/16/18; ooThe area on his left buttock better in some aspects although the area superiorly over the ischial tuberosity required an extensive debridement.using Santyl ooLeft heel appears stable. Using Iodoflex ooThe areas between his first and second toes  are not bad however there is spreading erythema up the dorsal aspect of his left foot this looks like cellulitis again. He is insensate the erythema is really very brilliant.o Erysipelas He went to see an allergist days ago because he was itching part of this he had lab work done. This showed [name] white count of 15.1 with 70% neutrophils. Hemoglobin of 11.4 and [name] platelet count of 659,000. Last white count we had in [facility] was [name] 2-1/2 years ago which was 25.9 but he was ill at the time. He was able to show me some lab work that was done by his primary physician the pattern is about the same. 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 But prompted me to go ahead and do x-rays of both feet and the pelvis rule out osteomyelitis. He also had [name] comprehensive metabolic panel this was reasonably normal his albumin was 3.7 liver function tests BUN/creatinine all normal 05/23/18; x-rays of both his feet from last week were negative for underlying pulmonary abnormality. The x-ray of his pelvis however showed mild irregularity in the left ischial which may represent some early osteomyelitis. The wound in the left ischial continues to get deeper clearly now exposed muscle. Each week necrotic surface material over this area. Whereas the rest of the wounds do not look so bad. ooThe left ischial wound we have been using Santyl and calcium alginate ooT the left heel surface necrotic debris using Iodoflex o ooThe left lateral leg is still healed ooAreas on the left dorsal foot and the right dorsal foot are about the same. There is some inflammation on the  left which might represent contact dermatitis, fungal dermatitis I am doubtful cellulitis although this looks better than last week 05/30/18; CT scan done at Hospital did not show any osteomyelitis or abscess. Suggested the possibility of underlying cellulitis although I don't see [name] lot of evidence of this at the bedside ooThe wound itself on the left buttock/upper thigh actually looks somewhat better. No debridement ooLeft heel also looks better no debridement continue Iodoflex ooBoth dorsal first second toe spaces appear better using Lotrisone. Left still required debridement 06/06/18; ooIntake reported some purulent looking drainage from the left gluteal wound. Using Santyl and calcium alginate ooLeft heel looks better although still [name] nonviable surface requiring debridement ooThe left dorsal foot first/second webspace actually expanding and somewhat deeper. I may consider doing [name] shave biopsy of this area ooRight dorsal foot first/second webspace appears stable to improved. Using Lotrisone and silver alginate to both these areas 06/13/18 ooLeft gluteal surface looks better. Now separated in the 2 wounds. No debridement required. Still drainage. We'll continue silver alginate ooLeft heel continues to look better with Iodoflex continue this for at least another week ooOf his dorsal foot wounds the area on the left still has some depth although it looks better than last week. We've been using Lotrisone and silver alginate 06/20/18 ooLeft gluteal continues to look better healthy tissue ooLeft heel continues to look better healthy granulation wound is smaller. He is using Iodoflex and his long as this continues continue the Iodoflex ooDorsal right foot looks better unfortunately dorsal left foot does not. There is swelling and erythema of his forefoot. He had minor trauma to this several days ago but doesn't think this was enough to have caused any tissue injury. Foot looks like  cellulitis, we have had this problem before 06/27/18 on evaluation today patient appears to be doing [name]  little worse in regard to his foot ulcer. 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 Both of which would really be his only options for treating this infection. In the past he has been given and effusion of Orbactiv. This is done very well for him in the past again it's one time dosing IV antibiotic therapy. Subsequently I do believe this is something we're gonna need to see about doing at this point in time. Currently his other wounds seem to be doing somewhat better in my pinion I'm pretty happy in that regard. 07/03/18 on evaluation today patient's wounds actually appear to be doing fairly well. He has been tolerating the dressing changes without complication. All in all he seems to be showing signs of improvement. In regard to the antibiotics he has been dealing with infectious disease since I saw him last week as far as getting this scheduled. In the end [facility]'s [facility] this coming Friday. In the meantime he has been continuing to perform the dressing changes in such as previous. There does not appear to be any evidence of infection worsengin at this time. 07/10/18; ooSince I last saw this man 2 [name] ago things have actually improved. IV antibiotics of resulted in less forefoot erythema although there is still some present. He is not systemically unwell ooLeft buttock wounds o2 now have no depth there is increased epithelialization Using silver alginate ooLeft heel still requires debridement using Iodoflex ooLeft dorsal foot still with [name] sizable wound about the size of [name] border but healthy granulation ooRight dorsal foot still with [name] slitlike area using silver alginate 07/18/18; the patient's  cellulitis in the left foot is improved in fact I think it is on its way to resolving. ooLeft buttock wounds o2 both look better although the larger one has hypertension granulation we've been using silver alginate ooLeft heel has some thick circumferential redundant skin over the wound edge which will need to be removed today we've been using Iodoflex ooLeft dorsal foot is still [name] sizable wound required debridement using silver alginate ooThe right dorsal foot is just about closed only [name] small open area remains here 07/25/18; left foot cellulitis is resolved ooLeft buttock wounds o2 both look better. Hyper-granulation on the major area ooLeft heel as some debris over the surface but otherwise looks [name] healthier wound. Using silver collagen ooRight dorsal foot is just about closed 07/31/18; arrives with our intake nurse worried about purulent drainage from the buttock. We had hyper-granulation here last week ooHis buttock wounds o2 continue to look better ooLeft heel some debris over the surface but measuring smaller. ooRight dorsal foot unfortunately has openings between the toes ooLeft foot superficial wound looks less aggravated. 08/07/18 ooButtock wounds continue to look better although some of her granulation and the larger medial wound. silver alginate ooLeft heel continues to look [name] lot better.silver collagen ooLeft foot superficial wound looks less stable. Requires debridement. He has [name] new wound superficial area on the foot on the lateral dorsal foot. ooRight foot looks better using silver alginate without Lotrisone 08/14/2018; patient was in the ER last week diagnosed with [name] UTI. He is now on Cefpodoxime and Macrodantin. ooButtock wounds continued to be smaller. Using silver alginate ooLeft heel continues to look better using silver collagen ooLeft foot superficial wound looks  as though it is improving ooRight dorsal foot area is just about healed. 08/21/2018; patient  is completed his antibiotics for his UTI. ooHe has 2 open areas on the buttocks. There is still not closed although the surface looks satisfactory. Using silver alginate ooLeft heel continues to improve using silver collagen ooThe bilateral dorsal foot areas which are at the base of his first and second toes/possible tinea pedis are actually stable on the left but worse on the right. The area on the left required debridement of necrotic surface. After debridement I obtained [name] specimen for PCR culture. ooThe right dorsal foot which is been just about healed last week is now reopened 08/28/2018; culture done on the left dorsal foot showed coag negative staph both staph epidermidis and Lugdunensis. I think this is worthwhile initiating systemic treatment. I will use doxycycline given his long list of allergies. The area on the left heel slightly improved but still requiring debridement. ooThe large wound on the buttock is just about closed whereas the smaller one is larger. Using silver alginate in this area 09/04/2018; patient is completing his doxycycline for the left foot although this continues to be [name] very difficult wound area with very adherent necrotic debris. We are using silver alginate to all his wounds right foot left foot and the small wounds on his buttock, silver collagen on the left heel. 09/11/2018; once again this patient has intense erythema and swelling of the left forefoot. Lesser degrees of erythema in the right foot. He has [name] long list of allergies and intolerances. I will reinstitute doxycycline. 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 Using silver alginate ooLeft heel also looks better using silver collagen ooUnfortunately both the areas on his feet look worse. The area on the left first second webspace is now gone through to the plantar part of his foot. The area on the left foot anteriorly is irritated with erythema  and swelling in the forefoot. 09/25/2018 ooHis wound on the left plantar heel looks better. Using silver collagen ooThe area on the left buttock 2 small remnant areas. One is closed one is still open. Using silver alginate ooThe areas between both his first and second toes look worse. This in spite of long-standing antifungal therapy with ketoconazole and silver alginate which should have antifungal activity ooHe has small areas around his original wound on the left calf one is on the bottom of the original scar tissue and one superiorly both of these are small and superficial but again given wound history in this site this is worrisome 10/02/2018 ooLeft plantar heel continues to gradually contract using silver collagen ooLeft buttock wound is unchanged using silver alginate ooThe areas on his dorsal feet between his first and second toes bilaterally look about the same. 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 Small open area here. We have been using silver alginate 10/09/2018; ooLeft plantar heel continues to look healthy and contract. No debridement is required ooLeft buttock slightly smaller with [name] tape injury wound just below which was new this week ooDorsal feet somewhat improved I have been using clindamycin ooLeft lateral looks lower extremity the actual open area looks worse although [name] lot of this is epithelialized. I am going to change to silver collagen today He has [name] lot more  swelling in the right leg although this is not pitting not red and not particularly warm there is [name] lot of spasm in the right leg usually indicative of people with paralysis of some underlying discomfort. We have reviewed his vascular status from 2017 he had [name] left greater saphenous vein ablation. I wonder about referring  him back to vascular surgery if the area on the left leg continues to deteriorate. 10/16/2018 in today for follow-up and management of multiple lower extremity ulcers. His left Buttock wound is much lower smaller and almost closed completely. The wound to the left ankle has began to reopen with Epithelialization and some adherent slough. He has multiple new areas to the left foot and leg. The left dorsal foot without much improvement. Wound present between left great webspace and 2nd toe. Erythema and edema present right leg. Right LE ultrasound obtained on 10/10/18 was negative for DVT . 10/23/2018; ooLeft buttock is closed over. Still dry macerated skin but there is no open wound. I suspect this is chronic pressure/moisture ooLeft lateral calf is quite [name] bit worse than when I saw this last. There is clearly drainage here he has macerated skin into the left plantar heel. We will change the primary dressing to alginate ooLeft dorsal foot has some improvement in overall wound area. Still using clindamycin and silver alginate ooRight dorsal foot about the same as the left using clindamycin and silver alginate ooThe erythema in the right leg has resolved. He is DVT rule out was negative ooLeft heel pressure area required debridement although the wound is smaller and the surface is health 10/26/2018 ooThe patient came back in for his nurse check today predominantly because of the drainage coming out of the left lateral leg with [name] recent reopening of his original wound on the left lateral calf. He comes in today with [name] large amount of surrounding erythema around the wound extending from the calf into the ankle and even in the area on the dorsal foot. He is not systemically unwell. He is not febrile. Nevertheless this looks like cellulitis. We have been using silver alginate to the area. I changed him to [name] regular visit and I am going to prescribe him doxycycline. The rationale here is [name] long list  of medication intolerances and [name] history of MRSA. I did not see anything that I thought would provide [name] valuable culture 10/30/2018 ooFollow-up from his appointment 4 days ago with really an extensive area of cellulitis in the left calf left lateral ankle and left dorsal foot. I put him on doxycycline. He has [name] long list of medication allergies which are true allergy reactions. 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 In any case he is [name] lot better today. The erythema especially in the anterior and lateral left calf is better. He still has left ankle erythema. He also is complaining about increasing edema in the right leg we have only been using Kerlix Coban and he has been doing the wraps at home. Finally he has [name] spotty rash on the medial part of his upper left calf which looks like folliculitis or perhaps wrap occlusion type injury. Small superficial macules not pustules 11/06/18 patient arrives today with again [name] considerable degree of erythema around the wound on the left lateral calf extending into the dorsal ankle and dorsal foot. This is [name] lot worse than when I saw this last week. He is on doxycycline really with not  [name] lot of improvement. He has not been systemically unwell Wounds on the; left heel actually looks improved. Original area on the left foot and proximity to the first and second toes looks about the same. He has superficial areas on the dorsal foot, anterior calf and then the reopening of his original wound on the left lateral calf which looks about the same ooThe only area he has on the right is the dorsal webspace first and second which is smaller. ooHe has [name] large area of dry erythematous skin on the left buttock small open area here. 11/13/2018; the patient arrives in much better condition. The erythema around the wound on the left lateral calf is [name] lot better. Not sure whether this was the clindamycin or  the TCA and ketoconazole or just in the improvement in edema control [stasis dermatitis]. In any case this is [name] lot better. The area on the left heel is very small and just about resolved using silver collagen we have been using silver alginate to the areas on his dorsal feet 11/20/2018; his wounds include the left lateral calf, left heel, dorsal aspects of both feet just proximal to the first second webspace. He is stable to slightly improved. I did not think any changes to his dressings were going to be necessary 11/27/2018 he has [name] reopening on the left buttock which is surrounded by what looks like tinea or perhaps some other form of dermatitis. The area on the left dorsal foot has some erythema around it I have marked this area but I am not sure whether this is cellulitis or not. Left heel is not closed. Left calf the reopening is really slightly longer and probably worse 1/13; in general things look better and smaller except for the left dorsal foot. Area on the left heel is just about closed, left buttock looks better only [name] small wound remains in the skin looks better [using Lotrisone] 1/20; the area on the left heel only has [name] few remaining open areas here. Left lateral calf about the same in terms of size, left dorsal foot slightly larger right lateral foot still not closed. The area on the left buttock has no open wound and the surrounding skin looks [name] lot better 1/27; the area on the left heel is closed. Left lateral calf better but still requiring extensive debridements. The area on his left buttock is closed. He still has the open areas on the left dorsal foot which is slightly smaller in the right foot which is slightly expanded. We have been using Iodoflex on these areas as well 2/3; left heel is closed. Left lateral calf still requiring debridement using Iodoflex there is no open area on his left buttock however he has dry scaly skin over [name] large area of this. Not really responding well  to the Lotrisone. Finally the areas on his dorsal feet at the level of the first second webspace are slightly smaller on the right and about the same on the left. Both of these vigorously debrided with Anasept and gauze 2/10; left heel remains closed he has dry erythematous skin over the left buttock but there is no open wound here. Left lateral leg has come in and with. Still requiring debridement we have been using Iodoflex here. Finally the area on the left dorsal foot and right dorsal foot are really about the same extremely dry callused fissured areas. He does not yet have [name] dermatology appointment 2/17; left heel remains closed. He has [name] new open  area on the left buttock. The area on the left lateral calf is bigger longer and still covered in necrotic debris. No major change in his foot areas bilaterally. I am awaiting for [name] dermatologist to look on this. We have been using ketoconazole I do not know that this is been doing any good at all. 2/24; left heel remains closed. The left buttock wound that was new reopening last week looks better. The left lateral calf appears better also although still requires debridement. The major area on his foot is the left first second also requiring debridement. We have been putting Prisma on all wounds. I do not believe that the ketoconazole has done too much good for his feet. He will use Lotrisone I am going to give him [name] 2-week course of terbinafine. We still do not have [name] dermatology appointment 3/2 left heel remains closed however there is skin over bone in this area I pointed this out to him today. The left buttock wound is epithelialized but still does not look completely stable. The area on the left leg required debridement were using silver collagen here. With regards to his feet we changed to Lotrisone last week and silver alginate. 3/9; left heel remains closed. Left buttock remains closed. The area on the right foot is essentially closed. The left  foot remains unchanged. Slightly smaller on the left lateral calf. Using silver collagen to both of these areas 3/16-Left heel remains closed. Area on right foot is closed. Left lateral calf above the lateral malleolus open wound requiring debridement with easy bleeding. Left dorsal wound proximal to first toe also debrided. Left ischial area open new. Patient has been using Prisma with wrapping every 3 days. Dermatology appointment is apparently tomorrow.Patient has completed his terbinafine 2-week course with some apparent improvement according to him, there is still flaking and dry skin in his foot on the left 3/23; area on the right foot is reopened. The area on the left anterior foot is about the same still [name] very necrotic adherent surface. He still has the area on the left leg and reopening is on the left buttock. He apparently saw dermatology although I do not have [name] note. According to the patient who is usually fairly well informed they did not have any good ideas. Put him on oral terbinafine which she is been on before. 3/30; using silver collagen to all wounds. Apparently his dermatologist put him on doxycycline and rifampin presumably some culture grew staph. I do not have this result. He remains on terbinafine although I have used terbinafine on him before 4/6; patient has had [name] fairly substantial reopening on the right foot between the first and second toes. He is finished his terbinafine and I believe is on doxycycline and rifampin still as prescribed by dermatology. We have been using silver collagen to all his wounds although the patient reports that he thinks silver alginate does better on the wounds on his buttock. 4/13; the area on his left lateral calf about the same size but it did not require debridement. ooLeft dorsal foot just proximal to the webspace between the first and second toes is about the same. Still nonviable surface. I note some superficial bronze discoloration of  the dorsal part of his foot ooRight dorsal foot just proximal to the first and second toes also looks about the same. I still think there may be the same discoloration I noted above on the left ooLeft buttock wound looks about the same 4/20; left lateral  calf appears to be gradually contracting using silver collagen. 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 The left dorsal foot wound is debrided of tightly adherent necrotic debris and really cleans up quite nicely. The right area is worse with expansion. I did not debride this it is now over the base of the second toe ooThe area on his left buttock is smaller no debridement is required using silver collagen 5/4; left calf continues to make good progress. ooHe arrives with erythema around the wounds on his dorsal foot which even extends to the plantar aspect. Very concerning for coexistent infection. He is finished the erythromycin I gave him for possible erythrasma this does not seem to have helped. ooThe area on the left foot is about the same base of the dorsal toes ooIs area on the buttock looks improved on the left 5/11; left calf and left buttock continued to make good progress. Left foot is about the same to slightly improved. ooMajor problem is on the right foot. He has not had an x-ray. Deep tissue culture I did last week showed both Enterobacter and E. coli. I did not change the doxycycline I put him on empirically although neither 1 of these were plated to doxycycline. He arrives today with the erythema looking worse on both the dorsal and plantar foot. Macerated skin on the bottom of the foot. he has not been systemically unwell 5/18-Patient returns at 1 week, left calf wound appears to be making some progress, left buttock wound appears slightly worse than last time, left foot wound looks slightly better, right foot redness is marginally better. X-ray of both feet show  no air or evidence of osteomyelitis. Patient is finished his Omnicef and terbinafine. He continues to have macerated skin on the bottom of the left foot as well as right 5/26; left calf wound is better, left buttock wound appears to have multiple small superficial open areas with surrounding macerated skin. X-rays that I did last time showed no evidence of osteomyelitis in either foot. He is finished cefdinir and doxycycline. I do not think that he was on terbinafine. He continues to have [name] large superficial open area on the right foot anterior dorsal and slightly between the first and second toes. I did send him to dermatology 2 months ago or so wondering about whether they would do [name] fungal scraping. I do not believe they did but did do [name] culture. We have been using silver alginate to the toe areas, he has been using antifungals at home topically either ketoconazole or Lotrisone. We are using silver collagen on the left foot, silver alginate on the right, silver collagen on the left lateral leg and silver alginate on the left buttock 6/1; left buttock area is healed. We have the left dorsal foot, left lateral leg and right dorsal foot. We are using silver alginate to the areas on both feet and silver collagen to the area on his left lateral calf 6/8; the left buttock apparently reopened late last week. He is not really sure how this happened. He is tolerating the terbinafine. Using silver alginate to all wounds 6/15; left buttock wound is larger than last week but still superficial. ooCame in the clinic today with [name] report of purulence from the left lateral leg I did not identify any infection ooBoth areas on his dorsal feet appear to be better. He is tolerating the terbinafine. Using silver alginate to all wounds 6/22; left buttock is about the  same this week, left calf quite [name] bit better. His left foot is about the same however he comes in with erythema and warmth in the right forefoot once  again. Culture that I gave him in the beginning of May showed Enterobacter and E. coli. I gave him doxycycline and things seem to improve although neither 1 of these organisms was specifically plated. 6/29; left buttock is larger and dry this week. Left lateral calf looks to me to be improved. Left dorsal foot also somewhat improved right foot completely unchanged. The erythema on the right foot is still present. He is completing the Ceftin dinner that I gave him empirically [see discussion above.) 7/6 - All wounds look to be stable and perhaps improved, the left buttock wound is slightly smaller, per patient bleeds easily, completed ceftin, the right foot redness is less, he is on terbinafine 7/13; left buttock wound about the same perhaps slightly narrower. Area on the left lateral leg continues to narrow. Left dorsal foot slightly smaller right foot about the same. We are using silver alginate on the right foot and Hydrofera Blue to the areas on the left. Unna boot on the left 2 layer compression on the right 7/20; left buttock wound absolutely the same. Area on lateral leg continues to get better. Left dorsal foot require debridement as did the right no major change in the 7/27; left buttock wound the same size necrotic debris over the surface. The area on the lateral leg is closed once again. His left foot looks better right foot about the same although there is some involvement now of the posterior first second toe area. He is still on terbinafine which I have given him for [name] month, not certain [name] centimeter major change 06/25/19-All wounds appear to be slightly improved according to report, left buttock wound looks clean, both foot wounds have minimal to no debris the right dorsal foot has minimal slough. We are using Hydrofera Blue to the left and silver alginate to the right foot and ischial wound. 8/10-Wounds all appear to be around the same, the right forefoot distal part has some redness  which was not there before, however the wound looks clean and small. Ischial wound looks about the same with no changes 8/17; his wound on the left lateral calf which was his original chronic venous insufficiency wound remains closed. Since I last saw him the areas on the left dorsal foot right dorsal foot generally appear better but require debridement. The area on his left initial tuberosity appears somewhat larger to me perhaps hyper granulated and bleeds very easily. We have been using Hydrofera Blue to the left dorsal foot and silver alginate to everything else 8/24; left lateral calf remains closed. The areas on his dorsal feet on the webspace of the first and second toes bilaterally both look better. The area on the left buttock which is the pressure ulcer stage II slightly smaller. I change the dressing to Hydrofera Blue to all areas 8/31; left lateral calf remains closed. The area on his dorsal feet bilaterally look better. Using Hydrofera Blue. Still requiring debridement on the left foot. No change in the left buttock pressure ulcers however 9/14; left lateral calf remains closed. Dorsal feet look quite [name] bit better than 2 [name] ago. Flaking dry skin also [name] lot better with the ammonium lactate I gave him 2 [name] ago. The area on the left buttock is improved. He states that his Roho cushion developed [name] leak and he is getting  [name] new one, in the interim he is offloading this vigorously 9/21; left calf remains closed. Left heel which was [name] possible DTI looks better this week. He had macerated tissue around the left dorsal foot right foot looks satisfactory and improved left buttock wound. I changed his dressings to his feet to silver alginate bilaterally. Continuing Hydrofera Blue on the left buttock. 9/28 left calf remains closed. Left heel did not develop anything [possible DTI] dry flaking skin on the left dorsal foot. Right foot looks satisfactory. Improved left buttock wound. We are using  silver alginate on his feet Hydrofera Blue on the buttock. I have asked him to go back to the Lotrisone on his feet including the wounds and surrounding areas 10/5; left calf remains closed. The areas on the left and right feet about the same. [name] lot of this is epithelialized however debris over the remaining open areas. He is using Lotrisone and silver alginate. The area on the left buttock using Hydrofera Blue 10/26. Patient has been out for 3 [name] secondary to Covid concerns. He tested negative but I think his wife tested positive. He comes in today with the left foot substantially worse, right foot about the same. Even more concerning he states that the area on his left buttock closed over but then reopened and is considerably deeper in one aspect than it was before [stage III wound] 11/2; left foot really about the same as last week. Quarter sized wound on the dorsal foot just proximal to the first second toes. Surrounding erythema with areas of denuded epithelium. This is not really much different looking. Did not look like cellulitis this time however. ooRight foot area about the same.. We have been using silver alginate alginate on his toes ooLeft buttock still substantial irritated skin around the wound which I think looks somewhat better. We have been using Hydrofera Blue here. 11/9; left foot larger than last week and [name] very necrotic surface. Right foot I think is about the same perhaps slightly smaller. Debris around the circumference also addressed. Unfortunately on the left buttock there is been [name] decline. Satellite lesions below the major wound distally and now [name] an additional one posteriorly we have been using Hydrofera Blue but I think this is [name] pressure issue 11/16; left foot ulcer dorsally again [name] very adherent necrotic surface. Right foot is about the same. Not much change in the pressure ulcer on his left buttock. 11/30; left foot ulcer dorsally basically the same as when I saw  him 2 [name] ago. Very adherent fibrinous debris on the wound surface. Patient reports [name] lot of drainage as well. 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 We have been using Iodoflex, patient changed back to alginate because of the drainage. Area on his right dorsal foot really looks benign with [name] healthier surface certainly [name] lot better than on the left. Left buttock wounds all improved using Hydrofera Blue 12/7; left dorsal foot again no improvement. Tightly adherent debris. PCR culture I did last week only showed likely skin contaminant. I have gone ahead and done [name] punch biopsy of this which is about the last thing in terms of investigations I can think to do. He has known venous insufficiency and venous hypertension and this could be the issue here. The area on the right foot is about the same left buttock slightly worse according to our intake nurse secondary to [facility] Blue sticking to the wound 12/14; biopsy of the  left foot that I did last time showed changes that could be related to wound healing/chronic stasis dermatitis phenomenon no neoplasm. We have been using silver alginate to both feet. I change the one on the left today to Sorbact and silver alginate to his other 2 wounds 12/28; the patient arrives with the following problems; ooMajor issue is the dorsal left foot which continues to be [name] larger deeper wound area. Still with [name] completely nonviable surface ooParadoxically the area mirror image on the right on the right dorsal foot appears to be getting better. ooHe had some loss of dry denuded skin from the lower part of his original wound on the left lateral calf. Some of this area looked [name] little vulnerable and for this reason we put him in wrap that on this side this week ooThe area on his left buttock is larger. He still has the erythematous circular area which I think is [name] combination of pressure, sweat. This does not look  like cellulitis or fungal dermatitis 11/26/2019; -Dorsal left foot large open wound with depth. Still debris over the surface. Using Sorbact ooThe area on the dorsal right foot paradoxically has closed over [facility] has [name] reopening on the left ankle laterally at the base of his original wound that extended up into the calf. This appears clean. ooThe left buttock wound is smaller but with very adherent necrotic debris over the surface. We have been using silver alginate here as well The patient had arterial studies done in 2017. He had biphasic waveforms at the dorsalis pedis and posterior tibial bilaterally. ABI in the left was 1.17. Digit waveforms were dampened. He has slight spasticity in the great toes I do not think [name] TBI would be possible 1/11; the patient comes in today with [name] sizable reopening between the first and second toes on the right. This is not exactly in the same location where we have been treating wounds previously. According to our intake nurse this was actually fairly deep but 0.6 cm. The area on the left dorsal foot looks about the same the surface is somewhat cleaner using Sorbact, his MRI is in 2 days. We have not managed yet to get arterial studies. The new reopening on the left lateral calf looks somewhat better using alginate. The left buttock wound is about the same using alginate 1/18; the patient had his ARTERIAL studies which were quite normal. ABI in the right at 1.13 with triphasic/biphasic waveforms on the left ABI 1.06 again with triphasic/biphasic waveforms. It would not have been possible to have done [name] toe brachial index because of spasticity. We have been using Sorbac to the left foot alginate to the rest of his wounds on the right foot left lateral calf and left buttock 1/25; arrives in clinic with erythema and swelling of the left forefoot worse over the first MTP area. This extends laterally dorsally and but also posteriorly. Still has an area on the left  lateral part of the lower part of his calf wound it is eschared and clearly not closed. ooArea on the left buttock still with surrounding irritation and erythema. ooRight foot surface wound dorsally. The area between the right and first and second toes appears better. 2/1; ooThe left foot wound is about the same. Erythema slightly better I gave him [name] week of doxycycline empirically ooRight foot wound is more extensive extending between the toes to the plantar surface ooLeft lateral calf really no open surface on the inferior part of his original wound however  the entire area still looks vulnerable ooAbsolutely no improvement in the left buttock wound required debridement. 2/8; the left foot is about the same. Erythema is slightly improved I gave him clindamycin last week. ooRight foot looks better he is using Lotrimin and silver alginate ooHe has [name] breakdown in the left lateral calf. Denuded epithelium which I have removed ooLeft buttock about the same were using Hydrofera Blue 2/15; left foot is about the same there is less surrounding erythema. Surface still has tightly adherent debris which I have debriding however not making any progress ooRight foot has [name] substantial wound on the medial right second toe between the first and second webspace. ooStill an open area on the left lateral calf distal area. ooButtock wound is about the same 2/22; left foot is about the same less surrounding erythema. Surface has adherent debris. Polymen Ag Right foot area significant wound between the first and second toes. We have been using silver alginate here Left lateral leg polymen Ag at the base of his original venous insufficiency wound ooLeft buttock some improvement here 3/1; ooRight foot is deteriorating in the first second toe webspace. Larger and more substantial. We have been using silver alginate. ooLeft dorsal foot about the same markedly adherent surface debris using PolyMem  Ag ooLeft lateral calf surface debris using PolyMem AG ooLeft buttock is improved again using PolyMem Ag. ooHe is completing his terbinafine. The erythema in the foot seems better. He has been on this for 2 [name] 3/8; no improvement in any wound area in fact he has [name] small open area on the dorsal midfoot which is new this week. He has not gotten his foot x-rays yet 3/15; his x-rays were both negative for osteomyelitis of both feet. No major change in any of his wounds on the extremities however his buttock wounds are better. We have been using polymen on the buttocks, left lower leg. Iodoflex on the left foot and silver alginate on the right 3/22; arrives in clinic today with the 2 major issues are the improvement in the left dorsal foot wound which for once actually looks healthy with [name] nice healthy wound surface without debridement. Using Iodoflex here. Unfortunately on the left lateral calf which is in the distal part of his original wound he came to the clinic here for there was purulent drainage noted some increased breakdown scattered around the original area and [name] small area proximally. We we are using polymen here will change to silver alginate today. His buttock wound on the left is better and I think the area on the right first second toe webspace is also improved 3/29; left dorsal foot looks better. Using Iodoflex. Left ankle culture from deterioration last time grew E. coli, Enterobacter and Enterococcus. I will give him [name] course of cefdinir although that will not cover Enterococcus. The area on the right foot in the webspace of the first and second toe lateral first toe looks better. The area on his buttock is about healed Vascular appointment is on April 21. This is to look at his venous system vis--vis continued breakdown of the wounds on the left including the left lateral leg and left dorsal foot he. He has had previous ablations on this side 4/5; the area between the right  first and second toes lateral aspect of the first toe looks better. Dorsal aspect of the left first toe on the left foot also improved. Unfortunately the left lateral lower leg is larger and there is [name] second satellite wound  superiorly. The usual superficial abrasions on the left buttock overall better but certainly not closed 4/12; the area between the right first and second toes is improved. Dorsal aspect of the left foot also slightly smaller with [name] vibrant healthy looking surface. No real change in the left lateral leg and the left buttock wound is healed He has an unaffordable co-pay for Apligraf. Appointment with vein and vascular with regards to the left leg venous part of the circulation is on 4/21 4/19; we continue to see improvement in all wound areas. Although this is minor. He has his vascular appointment on 4/21. The area on the left buttock has not reopened although right in the center of this area the skin looks somewhat threatened 4/26; the left buttock is unfortunately reopened. In general his left dorsal foot has [name] healthy surface and looks somewhat smaller although it was not measured as such. The area between his first and second toe webspace on the right as [name] small wound against the first toe. The patient saw vascular surgery. The real question I was asking was about the small saphenous vein on the left. He has previously ablated left greater saphenous vein. Nothing further was commented on on the left. Right greater saphenous vein without reflux at the saphenofemoral junction or proximal thigh there was no indication for ablation of the right greater saphenous vein duplex was negative for DVT bilaterally. They did not think there was anything from [name] vascular surgery point of view that could be offered. They ABIs within normal limits 5/3; only small open area on the left buttock. The area on the left lateral leg which was his original venous reflux is now 2 wounds both which look  clean. We are using Iodoflex on the left dorsal foot which looks healthy and smaller. He is down to [name] very tiny area between the right first and second toes, using silver alginate 5/10; all of his wounds appear better. We have much better edema control in 4 layer compression on the left. This may be the factor that is allowing the left foot and left lateral calf to heal. He has external compression garments at home 04/14/20-All of his wounds are progressing well, the left forefoot is practically closed, left ischium appears to be about the same, right toe webspace is also smaller. The left lateral leg is about the same, continue using Hydrofera Blue to this, silver alginate to the ischium, Iodoflex to the toe space on the right 6/7; most of his wounds outside of the left buttock are doing well. The area on the left lateral calf and left dorsal foot are smaller. The area on the right foot in between the first and second toe webspace is barely visible although he still says there is some drainage here is the only reason I did not heal this out. ooUnfortunately the area on the left buttock almost looks like he has [name] skin tear from tape. He has open wound and then [name] large flap of skin that we are trying to get adherence over an area just next to the remaining wound 6/21; 2 week follow-up. I believe is been here for nurse visits. Miraculously the area between his first and second toes on the left dorsal foot is closed over. Still open on the right first second web space. The left lateral calf has 2 open areas. Distally this is more superficial. The proximal area had [name] little more depth and required debridement of adherent necrotic material. His buttock wound  is actually larger we have been using silver alginate here 6/28; the patient's area on the left foot remains closed. Still open wet area between the first and second toes on the right and also extending into the plantar aspect. We have been using  silver alginate in this location. He has 2 areas on the left lower leg part of his original long wounds which I think are better. We have been using Hydrofera Blue here. Hydrofera Blue to the left buttock which is stable 7/12; left foot remains closed. Left ankle is closed. May be [name] small area between his right first and second toes the only truly open area is on the left buttock. We have been using Hydrofera Blue here 7/19; patient arrives with marked deterioration especially in the left foot and ankle. We did not put him in [name] compression wrap on the left last week in fact he wore his juxta lite stockings on either side although he does not have an underlying stocking. He has [name] reopening on the left dorsal foot, left lateral ankle and [name] new area on the right dorsal ankle. More worrisome is the degree of erythema on the left foot extending on the lateral foot into the lateral lower leg on the left 7/26; the patient had erythema and drainage from the lateral left ankle last week. Culture of this grew MRSA resistant to doxycycline and clindamycin which are the 2 antibiotics we usually use with this patient who has multiple antibiotic allergies including linezolid, trimethoprim sulfamethoxazole. I had give him an empiric doxycycline and he comes in the area certainly looks somewhat better although it is blotchy in his lower leg. He has not been systemically unwell. He has had areas on the left dorsal foot which is [name] reopening, chronic wounds on the left lateral ankle. Both of these I think are secondary to chronic venous insufficiency. The area between his first and second toes is closed as far as I can tell. He had [name] new wrap injury on the right dorsal ankle last week. Finally he has an area on the left buttock. We have been using silver alginate to everything except the left buttock we are using Hydrofera Blue 06/30/20-Patient returns at 1 week, has been given [name] sample dose pack of NUZYRA which is [name]  tetracycline derivative [omadacycline], patient has completed those, we have been using silver alginate to almost all the wounds except the left ischium where we are using Hydrofera Blue all of them look better 8/16; since I last saw the patient he has been doing well. The area on the left buttock, left lateral ankle and left foot are all closed today. He has completed the [place] I gave him last time and tolerated this well. He still has open areas on the right dorsal ankle and in the right first second toe area which we are using silver alginate. 8/23; we put him in his bilateral external compression stockings last week as he did not have anything open on either leg except for concerning area between the right first and second toe. He comes in today with an area on the left dorsal foot slightly more proximal than the original wound, the left lateral foot but this is actually [name] continuation of the area he had on the left lateral ankle from last time. As well he is opened up on the left buttock again. 8/30; comes in today with things looking [name] lot better. The area on the left lower ankle has closed down as  has the left foot but with eschar in both areas. The area on the dorsal right ankle is also epithelialized. Very little remaining of the left buttock wound. We have been using silver alginate on all wound areas 9/13; the area in the first second toe webspace on the right has fully epithelialized. He still has some vulnerable epithelium on the right and the ankle and the dorsal foot. He notes weeping. He is using his juxta lite stocking. On the left again the left dorsal foot is closed left lateral ankle is closed. We went to the juxta lite stocking here as well. ooStill vulnerable in the left buttock although only 2 small open areas remain here 9/27; 2-week follow-up. We did not look at his left leg but the patient says everything is closed. He is [name] bit disturbed by the amount of edema in his left  foot he is using juxta lite stockings but asking about over the toes stockings which would be 30/40, will talk to him next time. According to him there is no open wound on either the left foot or the left ankle/calf He has an open area on the dorsal right calf which I initially point [name] wrap injury. He has superficial remaining wound on the left ischial tuberosity been using silver alginate although he says this sticks to the wound 10/5; we gave him 2-week follow-up but he called yesterday expressing some concerns about his right foot right ankle and the left buttock. He came in early. There is still no open areas on the left leg and that still in his juxta lite stocking 10/11; he only has 1 small area on the left buttock that remains measuring millimeters 1 mm. Still has the same irritated skin in this area. We recommended zinc oxide when this eventually closes and pressure relief is meticulously is he can do this. He still has an area on the dorsal part of his right first through third toes which is [name] bit irritated and still open and on the dorsal ankle near the crease of the ankle. We have been using silver alginate and using his own stocking. He has nothing open on the left leg or foot 10/25; 2-week follow-up. Not nearly as good on the left buttock as I was hoping. For open areas with 5 looking threatened small. He has the erythematous irritated chronic skin in this area. oo1 area on the right dorsal ankle. He reports this area bleeds easily ooRight dorsal foot just proximal to the base of his toes ooWe have been using silver alginate. 11/8; 2-week follow-up. Left buttock is about the same although I do not think the wounds are in the same location we have been using silver alginate. I have asked him to use zinc oxide on the skin around the wounds. ooHe still has [name] small area on the right dorsal ankle he reports this bleeds easily ooRight dorsal foot just proximal to the base of the toes  does not have anything open although the skin is very dry and scaly ooHe has [name] new opening on the nailbed of the left great toe. Nothing on the left ankle 11/29; 3-week follow-up. Left buttock has 2 open areas. And washing of these wounds today started bleeding easily. Suggesting very friable tissue. We have been using silver alginate. Right dorsal ankle which I thought was initially [name] wrap injury we have been using silver alginate. Nothing open between the toes that I can see. He states the area on the left dorsal  toe nailbed healed after the last visit in 2 or 3 days 12/13; 3-week follow-up. His left buttock now has 3 open areas but the original 2 areas are smaller using polymen here. Surrounding skin looks better. The right dorsal ankle is closed. He has [name] small opening on the right dorsal foot at the level of the third toe. In general the skin looks better here. He is wearing his juxta lite stocking on the left leg says there is nothing open 11/24/2020; 3 [name] follow-up. His left buttock still has the 3 open areas. We have been using polymen but due to lack of response he changed to [facility] area. Surrounding skin is dry erythematous and irritated looking. There is no evidence of infection either bacterial or fungal however there is loss of surface epithelium ooHe still has very dry skin in his foot causing irritation and erythema on the dorsal part of his toes. This is not responded to prolonged courses of antifungal simply looks dry and irritated 1/24; left buttock area still looks about the same he was unable to find the triad ointment that we had suggested. The area on the right lower leg just above the dorsal ankle has reopened and the areas on the right foot between the first second and second third toes and scaling on the bottom of the foot has been about the same for quite some time now. been using silver alginate to all wound areas 2/7; left buttock wound looked quite good  although not much smaller in terms of surface area surrounding skin looks better. Only [name] few dry flaking areas on the right foot in between the first and second toes the skin generally looks better here [ammonium lactate]. Finally the area on the right dorsal ankle is closed 2/21; ooThere is no open area on the right foot even between the right first and second toe. Skin around this area dorsally and plantar aspects look better. ooHe has [name] reopening of the area on the right ankle just above the crease of the ankle dorsally. I continue to think that this is probably friction from spasms may be even this time with his stocking under the compression stockings. ooWounds on his left buttock look about the same there [name] couple of areas that have reopened. He has [name] total square area of loss of epithelialization. This does not look like infection it looks like [name] contact dermatitis but I just cannot determine to what 3/14; there is nothing on the right foot between the first and second toes this was carefully inspected under illumination. Some chronic irritation on the dorsal part of his foot from toes 1-3 at the base. Nothing really open here substantially. Still has an area on the right foot/ankle that is actually larger and hyper granulated. His buttock area on the left is just about closed however he has chronic inflammation with loss of the surface epithelial layer 3/28; 2-week follow-up. In clinic today with [name] new wound on the left anterior mid tibia. Says this happened about 2 [name] ago. He is not really sure how wonders about the spasticity of his legs at night whether that could have caused this other than that he does not have [name] good idea. He has been using topical antibiotics and silver alginate. The area on his right dorsal ankle seems somewhat better. ooFinally everything on his left buttock is closed. 4/11; 2-week follow-up. All of his wounds are better except for the area over the ischium  and left buttock which  have opened up widely again. At least part of this is covered in necrotic fibrinous material another part had rolled nonviable skin. The area on the right ankle, left anterior mid tibia are both [name] lot better. He had no open wounds on either foot including the areas between the first and second toes 4/25; patient presents for 2-week follow-up. He states that the wounds are overall stable. He has no complaints today and states he is using Hydrofera Blue to open wounds. 5/9; have not seen this man in over [name] month. For my memory he has open areas on the left mid tibia and right ankle. T oday he has new open area on the right dorsal foot which we have not had [name] problem with recently. He has the sustained area on the left buttock He is also changed his insurance at the beginning of the year [employer]. We will need prior authorizations for debridement 5/23; patient presents for 2-week follow-up. He has prior authorizations for debridement. He denies any issues in the past 2 [name] with his wound care. He has been using Hydrofera Blue to all the wounds. He does report [name] circular rash to the upper left leg that is new. He denies acute signs of infection. 6/6; 2-week follow-up. The patient has open wounds on the left buttock which are worse than the last time I saw this about [name] month ago. He also has [name] new area to me on the left anterior mid tibia with some surrounding erythema. The area on the dorsal ankle on the right is closed but I think this will be [name] friction injury every time this area is exposed to either our wraps or his compression stockings caused by unrelenting spasms in this leg. 6/20; 2-week follow-up. oo The patient has open wounds on the left buttock which is about the same. Using [facility] here. - The left mid tibia has [name] static amount of surrounding erythema. Also [name] raised area in the center. We have been using Hydrofera Blue here. ooooFinally he has broken  down in his dorsal right foot extending between the first and second toes and going to the base of the first and second toe webspace. I have previously assumed that this was severe venous hypertension 7/5; 2-week follow-up oo The left buttock wound actually looks better. We are using Hydrofera Blue. He has extensive skin irritation around this area and I have not really been able to get that any better. I have tried Lotrisone i.e. antifungals and steroids. More most recently we have just been using Coloplast really looks about the same. oo The left mid tibia which was new last week culture to have very resistant staph aureus. Not only methicillin-resistant but doxycycline resistant. The patient has [name] plethora of antibiotic allergies including sulfa, linezolid. I used topical bacitracin on this but he has not started this yet. oo In addition he has an expanding area of erythema with [name] wound on the dorsal right foot. I did [name] deep tissue culture of this area today 7/12; oo Left buttock area actually looks better surrounding skin also looks less irritated. oo Left mid tibia looks about the same. He is using bacitracin this is not worse oo Right dorsal foot looks about the same as well. oo The left first toe also looks about the same 7/19; left buttock wound continues to improve in terms of open areas oo Left mid tibia is still concerning amount of swelling he is using bacitracin oo Dorsal left first  toe somewhat smaller oo Right dorsal foot somewhat smaller 7/25; left buttock wound actually continues to improve oo Left mid tibia area has less swelling. I gave him all my samples of new Nuzyra. This seems to have helped although the wound is still open it. His abrasion closed by here oo Left dorsal great toe really no better. Still [name] very nonviable surface oo Right dorsal foot perhaps some better. oo We have been using bacitracin and silver nitrate to the areas on his lower legs and  Hydrofera Blue to the area on the buttock. 8/16 oo Disappointed that his left buttock wound is actually more substantial. Apparently during the last nurse visit these were both very small. He has continued irritation to [name] large area of skin on his buttock. I have never been able to totally explain this although I think it some combination of the way he sits, pressure, moisture. He is not incontinent enough to contribute to this. oo Left dorsal great toe still fibrinous debris on the surface that I have debrided today oo Large area across the dorsal right toes. oo The area on the left anterior mid tibia has less swelling. He completed the [place]. This does not look infected although the tissue is still fried 8/30; 2-week follow-up. oo Left buttock areas not improved. We used Hydrofera Blue on this. Weeping wet with the surrounding erythema that I have not been able to control even with Lotrisone and topical Coloplast oo Left dorsal great toe looks about the same oo More substantial area again at the base of his toes on the left which is new this week. oo Area across the dorsal right toes looks improved oo The left anterior mid tibia looks like it is trying to close 9/13; 2-week follow-up. Using silver alginate on all of his wounds. The left dorsal foot does not look any better. He has the area on the dorsal toe and also the areas at the base of all of his toes 1 through 3. On the right foot he has [name] similar pattern in [name] similar area. He has the area on his left mid tibia that looks fairly healthy. Finally the area on his left buttock looks somewhat bette 9/20; culture I did of the left foot which was [name] deep tissue culture last time showed E. coli he has erythema around this wound. Still [name] completely necrotic surface. His right dorsal foot looks about the same. He has [name] very friable surface to the left anterior mid tibia. Both buttock wounds look better. We have been using silver alginate  to all wounds 10/4; he has completed the cephalexin that I gave him last time for the left foot. He is using topical gentamicin under silver alginate silver alginate being applied to all the wounds. Unfortunately all the wounds look irritated on his dorsal right foot dorsal left foot left mid tibia. I wonder if this could be [name] silver allergy. I am going to change him to [facility] on the lower extremity. The skin on the left buttock and left posterior thigh still flaking dry and irritated. This has continued no matter what I have applied topically to this. He has [name] solitary open wound which by itself does not look too bad however the entire area of surrounding skin does not change no matter what we have applied here 10/18; the area on the left dorsal foot and right dorsal foot both look better. The area on the right extends into the plantar but not between  his toes. We have been using silver alginate. He still has [name] rectangular erythematous area around the area on the left tibia. The wound itself is very small. Finally everything on his left buttock looks [name] little larger the skin is erythematous 11/15; patient comes in with the left dorsal and right dorsal foot distally looking somewhat better. Still nonviable surface on the left foot which required debridement. He still has the area on the left anterior mid tibia although this looks somewhat better. He has [name] new area on the right lateral lower leg just above the ankle. Finally his left buttock looks terrible with multiple superficial open wounds geometric square shaped area of chronic erythema which I have not been able to sort through 11/29; right dorsal foot and left dorsal foot both look somewhat better. No debridement required. He has the fragile area on the left anterior mid tibia this looks and continues to look somewhat better. Right lateral lower leg just above the ankle we identified last time also looks better. In general the area on his  left buttock looks improved. We are using Hydrofera Blue to all wound areas 12/13; right dorsal foot looks better. The area on the right lateral leg is healed. Left dorsal foot has 2 open areas both of which require debridement. The fragile area on the left anterior mid tibial looks better. Smaller area on his buttocks. Were using Hydrofera Blue 1/10; patient comes in with everything looking slightly larger and/or worse. This includes his left buttock, reopening of the left mid tibia, larger areas on the left dorsal foot and what looks to be [name] cellulitis on the right dorsal and plantar foot. We have been using Hydrofera Blue on all wounds. 1/17; right dorsal foot distally looks better today. The left foot has 2 open wounds that are about the same surrounding erythema. 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 No malignancy they did not stain for fungus 1/24; his right distal foot is not closed dry and scaly but the wound looks like it is contracting. I did not debride anything here. Problem on his left dorsal foot with expanding erythema. Apparently there were problems last week getting the Elesa Hacker however it is now available at the [facility] but [name] week later. He is using ketoconazole and Coloplast to the left buttock along with Hydrofera Blue this actually looks quite [name] bit better today. 1/31; right dorsal foot again is dry and scaly but looking to contract. He has been using [name] moisturizer on his feet at my request but he is not sure which 1. The left dorsal foot wounds look about the same there is erythema here that I marked last week however after course of Nuzyra it certainly is not any better but not any worse either. Finally on his left buttock the skin continues to look better he has the original wound but [name] new substantial area towards the gluteal  cleft. Almost like [name] skin tear. I used scissors to remove skin and subcutaneous tissue here silver nitrate and direct pressure 2/7; right dorsal foot. This does not look too much different from last week. Some erythema skin dry and scaly. No debridement. Left dorsal great toe again still not much improvement. I did remove flaking dry skin and callus from around the edge. Finally on his left buttock. The skin is somewhat better in the periwound. Surface wounds are superficial  somewhat better than last week. 01/26/2022: Is [name] little bit of [name] mystery as to why his wounds fail to respond to treatments and actually seem to get worse. This is my first encounter with this patient. He was previously followed by Dr. Dellia Nims. Based upon my review of the chart, it seems that there is [name] little bit of [name] mystery as to why his wounds do not respond as anticipated to the interventions applied and sometimes even get worse. Biopsies have been performed and he was seen by dermatology in [place], but that did not shed any light on the matter. T oday, his gluteal wound is larger, with substantial drainage, rather malodorous. The food wounds are not terrible, but he has [name] lot of callus and scaly skin around these. He is currently getting silver alginate on the gluteal wound, with idodoflex to the feet. He is using lotrisone on his legs for the dry, scaly skin. 02/09/2022: There has really been no change to any of his wounds. The gluteal wound less drainage and odor, but remains about the same size, the periwound skin remains oddly scaly. His lower extremity wounds also appear roughly the same size. They continue to accumulate [name] small amount of slough. The periwound on his feet and ankle wounds has dry eschar and loose dead skin. We have been using silver alginate on the gluteal wound and Iodoflex on his feet and ankle wounds. T the periwound around his gluteal lesion and Lotrisone on his feet and legs. o 02/23/2022: The right  plantar foot wound is closed. The gluteal site looks small but has continued to produce hypertrophic granulation tissue. The foot wounds all look about the same on the dorsal surface of the right foot; on the left, there is only [name] small open area at the site of where his left great toenail would have been. 03/16/2022: The right ankle wound is healed. The right dorsal foot wound is about the same. The left dorsal foot wound is quite [name] bit smaller and the ischial wound is nearly closed. 03/30/2022: The right ankle wound reopened. Both dorsal foot wounds are quite [name] bit smaller. Unfortunately, he appears to have sheared part of his ischial wound open further, perhaps during [name] transfer. 04/13/2022: The right ankle wound has hypertrophic granulation tissue present. The dorsal foot wounds continue to decrease in size. The ischial wound looks about the same today, no better, no worse. 04/27/2022: The right ankle wound has closed. Unfortunately, it looks like some moisture got underneath the dry skin on both of his dorsal feet and these wounds have expanded in size. The ischial wound remains the same with perhaps [name] little bit more slough accumulation than at our previous visit. 05/11/2022: The right ankle wound remains closed. There is [name] left anterior tibial wound that is small has patchy openings with accumulated slough. The dorsum of his right foot appears to be nearly healed with just [name] small punctate opening. The plantar surface of his right foot has [name] new opening that looks like he may have picked some skin there. His sacral ulcer has hypertrophic granulation tissue but has some slough accumulation. The dorsum of his left foot has multiple open areas in [name] fairly ragged distribution. All of these have slough accumulated within them. 06/01/2022: The right ankle and left anterior tibial wound are both closed. Dorsum of his right foot and left foot both look substantially better with just tiny scattered openings  The without any slough accumulation. He has sheared open new  areas on his left gluteus and ischium. He says that his wheelchair cushion, which is air-filled, has [name] leak and so it keeps deflating. He is awaiting [name] new cushion. Patient History Information obtained from Patient. Family History Diabetes - Father, Hypertension - Mother, No family history of Cancer, Heart Disease, Hereditary Spherocytosis, Kidney Disease, Lung Disease, Stroke, Thyroid Problems, Tuberculosis. Social History Former smoker, Marital Status - Married, Alcohol Use - Moderate, Drug Use - No History, Caffeine Use - Daily. Medical History Ear/Nose/Mouth/Throat Denies history of Chronic sinus problems/congestion, Middle ear problems Hematologic/Lymphatic Denies history of Anemia, Hemophilia, Human Immunodeficiency Virus, Lymphedema, Sickle Cell Disease Respiratory Patient has history of Sleep Apnea - does not tolerate cpap Denies history of Aspiration, Asthma, Chronic Obstructive Pulmonary Disease (COPD), Pneumothorax, Tuberculosis Cardiovascular Patient has history of Hypertension - lisinopril HCTZ Denies history of Angina, Arrhythmia, Congestive Heart Failure, Coronary Artery Disease, Deep Vein Thrombosis, Hypotension, Myocardial Infarction, Peripheral Arterial Disease, Peripheral Venous Disease, Phlebitis, Vasculitis Gastrointestinal Denies history of Cirrhosis , Colitis, Crohnoos, Hepatitis [name], Hepatitis B, Hepatitis C Endocrine Denies history of Type I Diabetes, Type II Diabetes Genitourinary Denies history of End Stage Renal Disease Immunological Denies history of Lupus Erythematosus, Raynaudoos, Scleroderma Integumentary (Skin) Denies history of History of Burn Musculoskeletal Denies history of Gout, Rheumatoid Arthritis, Osteoarthritis, Osteomyelitis Neurologic Patient has history of Paraplegia Denies history of Dementia, Neuropathy, Quadriplegia, Seizure Disorder Oncologic Denies history of Received  Chemotherapy, Received Radiation Psychiatric Denies history of Anorexia/bulimia, Confinement Anxiety Hospitalization/Surgery History - cellulitis in leg. - left leg vein ablation. Objective Constitutional No acute distress.. Vitals Time Taken: 8:20 AM, Height: 70 in, Weight: 216 lbs, BMI: 31, Temperature: 98.8 F, Pulse: 117 bpm, Respiratory Rate: 18 breaths/min, Blood Pressure: 145/72 mmHg. Respiratory Normal work of breathing on room air.. General Notes: 06/01/2022: The right ankle and left anterior tibial wound are both closed. Dorsum of his right foot and left foot both look substantially better with just tiny scattered openings The without any slough accumulation. He has sheared open new areas on his left gluteus and ischium Integumentary (Hair, Skin) Wound #41R status is Open. Original cause of wound was Gradually Appeared. The date acquired was: 03/16/2020. The wound has been in treatment 115 [name]. The wound is located on the Left Ischium. The wound measures 2.3cm length x 4.7cm width x 0.1cm depth; 8.49cm^2 area and 0.849cm^3 volume. There is Fat Layer (Subcutaneous Tissue) exposed. There is no tunneling or undermining noted. There is [name] medium amount of serosanguineous drainage noted. The wound margin is distinct with the outline attached to the wound base. There is medium (34-66%) red, pink granulation within the wound bed. There is [name] medium (34-66%) amount of necrotic tissue within the wound bed including Adherent Slough. Wound #52 status is Open. Original cause of wound was Gradually Appeared. The date acquired was: 03/27/2021. The wound has been in treatment 61 [name]. The wound is located on the Right,Dorsal Foot. The wound measures 1.2cm length x 0.1cm width x 0.1cm depth; 0.094cm^2 area and 0.009cm^3 volume. There is Fat Layer (Subcutaneous Tissue) exposed. There is no tunneling or undermining noted. There is [name] small amount of serous drainage noted. The wound margin is flat and  intact. There is small (1-33%) red granulation within the wound bed. There is no necrotic tissue within the wound bed. Wound #56 status is Open. Original cause of wound was Gradually Appeared. The date acquired was: 07/11/2021. The wound has been in treatment 45 [name]. The wound is located on the Left,Dorsal Foot. The wound  measures 2.9cm length x 1.2cm width x 0.1cm depth; 2.733cm^2 area and 0.273cm^3 volume. There is Fat Layer (Subcutaneous Tissue) exposed. There is no tunneling or undermining noted. There is [name] small amount of serous drainage noted. The wound margin is flat and intact. There is small (1-33%) red, pink granulation within the wound bed. There is [name] small (1-33%) amount of necrotic tissue within the wound bed including Adherent Slough. Wound #63 status is Healed - Epithelialized. Original cause of wound was Gradually Appeared. The date acquired was: 05/11/2022. The wound has been in treatment 3 [name]. The wound is located on the Left,Anterior Lower Leg. The wound measures 0cm length x 0cm width x 0cm depth; 0cm^2 area and 0cm^3 volume. There is no tunneling or undermining noted. There is [name] none present amount of drainage noted. There is no granulation within the wound bed. There is no necrotic tissue within the wound bed. Wound #64 status is Open. Original cause of wound was Gradually Appeared. The date acquired was: 05/11/2022. The wound has been in treatment 3 [name]. The wound is located on the Amelia. The wound measures 0.1cm length x 0.1cm width x 0.1cm depth; 0.008cm^2 area and 0.001cm^3 volume. There is no tunneling or undermining noted. There is [name] none present amount of drainage noted. The wound margin is flat and intact. There is no granulation within the wound bed. There is no necrotic tissue within the wound bed. Wound #65 status is Open. Original cause of wound was Pressure Injury. The date acquired was: 05/24/2022. The wound is located on the Left Upper Leg. The wound  measures 4cm length x 2cm width x 0.1cm depth; 6.283cm^2 area and 0.628cm^3 volume. There is Fat Layer (Subcutaneous Tissue) exposed. There is no tunneling or undermining noted. There is [name] medium amount of serosanguineous drainage noted. The wound margin is flat and intact. There is medium (34- 66%) pink, pale granulation within the wound bed. There is [name] medium (34-66%) amount of necrotic tissue within the wound bed including Adherent Slough. 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 There was [name] Selective/Open Wound Non-Viable Tissue Debridement with [name] total area of 6.9 sq cm performed by [name] Maudlin, MD. With the following instrument(s): Curette to remove Non-Viable tissue/material. Material removed includes Slough and Biofilm and. No specimens were taken. [name] time out was conducted at 08:50, prior to the start of the procedure. [name] Minimum amount of bleeding was controlled with Pressure. The procedure was tolerated well with [name] pain level of Insensate throughout and [name] pain level of Insensate following the procedure. Post Debridement Measurements: 2.3cm length x 4.7cm width x 0.1cm depth; 0.849cm^3 volume. Post debridement Stage noted as Category/Stage II. Character of Wound/Ulcer Post Debridement is improved. Post procedure Diagnosis Wound #41R: Same as Pre-Procedure Wound #65 Pre-procedure diagnosis of Wound #65 is [name] Pressure Ulcer located on the Left Upper Leg . There was [name] Selective/Open  Wound Non-Viable Tissue Debridement with [name] total area of 6 sq cm performed by [name] Maudlin, MD. With the following instrument(s): Curette to remove Non-Viable tissue/material. Material  removed includes Slough and Biofilm and. No specimens were taken. [name] time out was conducted at 08:50, prior to the start of the procedure. [name] Minimum amount of bleeding was controlled with Pressure. The procedure was tolerated well with [name] pain level of Insensate throughout and [name] pain level of Insensate following the procedure. Post Debridement Measurements: 4cm length x 2cm width x 0.1cm depth; 0.628cm^3 volume. Post debridement Stage noted as Category/Stage III. Character of Wound/Ulcer Post Debridement is improved. Post procedure Diagnosis Wound #65: Same as Pre-Procedure Plan Follow-up Appointments: Return Appointment in 2 [name]. - Dr Celine Ahr - Room 1 Bathing/ Shower/ Hygiene: May shower and wash wound with soap and water. - on days that dressing is changed Edema Control - Lymphedema / SCD / Other: Elevate legs to the level of the heart or above for 30 minutes daily and/or when sitting, [name] frequency of: - throughout the day Moisturize legs daily. - using Aquaphor generously to both legs and feet with dressing changes Compression stocking or Garment 30-40 mm/Hg pressure to: - Juxtalite to both legs daily Off-Loading: Roho cushion for wheelchair Turn and reposition every 2 hours WOUND #41R: - Ischium Wound Laterality: Left Cleanser: Soap and Water Every Other Day/30 Days Discharge Instructions: May shower and wash wound with dial antibacterial soap and water prior to dressing change. Peri-Wound Care: Zinc Oxide Ointment 30g tube Every Other Day/30 Days Discharge Instructions: Apply Zinc Oxide to periwound with each dressing change Prim Dressing: KerraCel Ag Gelling Fiber Dressing, 4x5 in (silver alginate) (Generic) Every Other Day/30 Days ary Discharge Instructions: Apply silver alginate to wound bed as  instructed Secondary Dressing: ALLEVYN Gentle Border, 4x4 (in/in) Every Other Day/30 Days Discharge Instructions: Apply over primary dressing as directed. WOUND #52: - Foot Wound Laterality: Dorsal, Right Cleanser: Soap and Water Every Other Day/30 Days Discharge Instructions: May shower and wash wound with dial antibacterial soap and water prior to dressing change. Peri-Wound Care: Triamcinolone 15 (g) Every Other Day/30 Days Discharge Instructions: Use triamcinolone 15 (g) as directed Peri-Wound Care: Sween Lotion (Moisturizing lotion) Every Other Day/30 Days Discharge Instructions: Apply Aquaphor moisturizing lotion as directed Peri-Wound Care: Lotrisone Every Other Day/30 Days Discharge Instructions: Apply Lotrisone to periwound Prim Dressing: KerraCel Ag Gelling Fiber Dressing, 2x2 in (silver alginate) Every Other Day/30 Days ary Discharge Instructions: Apply silver alginate to wound bed as instructed Secondary Dressing: Woven Gauze Sponge, Non-Sterile 4x4 in Every Other Day/30 Days Discharge Instructions: Apply over primary dressing as directed. Secured With: [employer], 4.5x3.1 (in/yd) (Generic) Every Other Day/30 Days Discharge Instructions: Secure with Kerlix as directed. Secured With: 24M Medipore H Soft Cloth Surgical T ape, 4 x 10 (in/yd) (Generic) Every Other Day/30 Days Discharge Instructions: Secure with tape as directed. WOUND #56: - Foot Wound Laterality: Dorsal, Left Cleanser: Soap and Water Every Other Day/30 Days Discharge Instructions: May shower and wash wound with dial antibacterial soap and water prior to dressing change. Peri-Wound Care: Triamcinolone 15 (g) Every Other Day/30 Days Discharge Instructions: Use triamcinolone 15 (g) as directed Peri-Wound Care: Sween Lotion (Moisturizing lotion) Every Other Day/30 Days Discharge Instructions: Apply Aquaphor moisturizing lotion as directed Peri-Wound Care: Lotrisone Every Other Day/30 Days Discharge  Instructions: Apply Lotrisone to periwound Prim Dressing: KerraCel Ag Gelling Fiber Dressing, 2x2 in (silver alginate) Every Other Day/30 Days ary Discharge Instructions: Apply silver alginate to wound bed as instructed Secondary Dressing: Woven Gauze Sponge, Non-Sterile 4x4 in Every Other Day/30 Days Discharge Instructions: Apply over primary dressing as directed. Secured With: [employer],  4.5x3.1 (in/yd) (Generic) Every Other Day/30 Days Discharge Instructions: Secure with Kerlix as directed. Secured With: 67M Medipore H Soft Cloth Surgical T ape, 4 x 10 (in/yd) (Generic) Every Other Day/30 Days Discharge Instructions: Secure with tape as directed. WOUND #64: - Foot Wound Laterality: Plantar, Right Cleanser: Soap and Water Every Other Day/30 Days Discharge Instructions: May shower and wash wound with dial antibacterial soap and water prior to dressing change. Peri-Wound Care: Triamcinolone 15 (g) Every Other Day/30 Days Discharge Instructions: Use triamcinolone 15 (g) as directed Peri-Wound Care: Sween Lotion (Moisturizing lotion) Every Other Day/30 Days Discharge Instructions: Apply Aquaphor moisturizing lotion as directed Peri-Wound Care: Lotrisone Every Other Day/30 Days Discharge Instructions: Apply Lotrisone to periwound Prim Dressing: KerraCel Ag Gelling Fiber Dressing, 2x2 in (silver alginate) Every Other Day/30 Days ary Discharge Instructions: Apply silver alginate to wound bed as instructed Secondary Dressing: Woven Gauze Sponge, Non-Sterile 4x4 in Every Other Day/30 Days Discharge Instructions: Apply over primary dressing as directed. Secured With: [employer], 4.5x3.1 (in/yd) (Generic) Every Other Day/30 Days Discharge Instructions: Secure with Kerlix as directed. Secured With: 67M Medipore H Soft Cloth Surgical T ape, 4 x 10 (in/yd) (Generic) Every Other Day/30 Days Discharge Instructions: Secure with tape as directed. WOUND #65: - Upper Leg Wound Laterality:  Left Cleanser: Soap and Water Every Other Day/30 Days Discharge Instructions: May shower and wash wound with dial antibacterial soap and water prior to dressing change. Peri-Wound Care: Zinc Oxide Ointment 30g tube Every Other Day/30 Days Discharge Instructions: Apply Zinc Oxide to periwound with each dressing change Prim Dressing: KerraCel Ag Gelling Fiber Dressing, 4x5 in (silver alginate) (Generic) Every Other Day/30 Days ary Discharge Instructions: Apply silver alginate to wound bed as instructed Secondary Dressing: ALLEVYN Gentle Border, 4x4 (in/in) Every Other Day/30 Days Discharge Instructions: Apply over primary dressing as directed. 06/01/2022: The right ankle and left anterior tibial wound are both closed. Dorsum of his right foot and left foot both look substantially better with just tiny scattered openings The without any slough accumulation. He has sheared open new areas on his left gluteus and ischium. I did not need to debride any of the wounds on his feet. I did use [name] curette to debride slough and biofilm from the gluteal and ischial wounds. We will continue using silver alginate to all sites. I encouraged him to follow-up with his wheelchair company to get his cushion replaced. Follow-up in 2 [name]. Electronic Signature(s) Signed: 06/01/2022 6:01:19 PM By: [name] Gouty RN, BSN Signed: 06/02/2022 9:14:37 AM By: [name] Maudlin MD FACS Previous Signature: 06/01/2022 10:37:53 AM Version By: [name] Maudlin MD FACS Entered By: [name] on 06/01/2022 17:44:45 -------------------------------------------------------------------------------- HxROS Details Patient Name: Date of Service: [name], [name] 06/01/2022 8:15 [name] Medical Record Number: [id number] Patient Account Number: [id number] Date of Birth/Sex: Treating RN: Sep 25, 1986 (36 y.o. [name] Primary Care Provider: [place], [place] Other Clinician: Referring Provider: Treating Provider/Extender: [name] in Treatment: 334 Information Obtained From Patient Ear/Nose/Mouth/Throat Medical History: Negative for: Chronic sinus problems/congestion; Middle ear problems Hematologic/Lymphatic Medical History: Negative for: Anemia; Hemophilia; Human Immunodeficiency Virus; Lymphedema; Sickle Cell Disease Respiratory Medical History: Positive for: Sleep Apnea - does not tolerate cpap Negative for: Aspiration; Asthma; Chronic Obstructive Pulmonary Disease (COPD); Pneumothorax; Tuberculosis Cardiovascular Medical History: Positive for: Hypertension - lisinopril HCTZ Negative for: Angina; Arrhythmia; Congestive Heart Failure; Coronary Artery Disease; Deep Vein Thrombosis; Hypotension; Myocardial Infarction; Peripheral Arterial Disease; Peripheral Venous Disease; Phlebitis; Vasculitis Gastrointestinal Medical History: Negative for: Cirrhosis ;  Colitis; Crohns; Hepatitis [name]; Hepatitis B; Hepatitis C Endocrine Medical History: Negative for: Type I Diabetes; Type II Diabetes Genitourinary Medical History: Negative for: End Stage Renal Disease Immunological Medical History: Negative for: Lupus Erythematosus; Raynauds; Scleroderma Integumentary (Skin) Medical History: Negative for: History of Burn Musculoskeletal Medical History: Negative for: Gout; Rheumatoid Arthritis; Osteoarthritis; Osteomyelitis Neurologic Medical History: Positive for: Paraplegia Negative for: Dementia; Neuropathy; Quadriplegia; Seizure Disorder Oncologic Medical History: Negative for: Received Chemotherapy; Received Radiation Psychiatric Medical History: Negative for: Anorexia/bulimia; Confinement Anxiety Immunizations Pneumococcal Vaccine: Received Pneumococcal Vaccination: No Immunization Notes: doesn't remember when last tetanus shot was Implantable Devices No devices added Hospitalization / Surgery History Type of Hospitalization/Surgery cellulitis in leg left leg vein  ablation Family and Social History Cancer: No; Diabetes: Yes - Father; Heart Disease: No; Hereditary Spherocytosis: No; Hypertension: Yes - Mother; Kidney Disease: No; Lung Disease: No; Stroke: No; Thyroid Problems: No; Tuberculosis: No; Former smoker; Marital Status - Married; Alcohol Use: Moderate; Drug Use: No History; Caffeine Use: Daily; Financial Concerns: No; Food, Clothing or Shelter Needs: No; Support System Lacking: No; Transportation Concerns: No Engineer, maintenance) Signed: 06/01/2022 4:27:02 PM By: [name] Maudlin MD FACS Signed: 06/01/2022 6:01:19 PM By: [name] Gouty RN, BSN Entered By: [name] on 06/01/2022 10:36:16 -------------------------------------------------------------------------------- SuperBill Details Patient Name: Date of Service: [name], [name] 06/01/2022 Medical Record Number: [id number] Patient Account Number: [id number] Date of Birth/Sex: Treating RN: 06-14-1986 (36 y.o. [name] Primary Care Provider: [place], [place] Other Clinician: Referring Provider: Treating Provider/Extender: Graciela Husbands, [name] [name] in Treatment: 334 Diagnosis Coding ICD-10 Codes Code Description I87.332 Chronic venous hypertension (idiopathic) with ulcer and inflammation of left lower extremity L97.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, 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 with other 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, 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 Non-pressure chronic ulcer of other part of left foot limited to breakdown of skin Facility Procedures CPT4 Code: [id number] Description: 22297 - DEBRIDE WOUND 1ST 20 SQ CM OR < ICD-10 Diagnosis Description L89.323 Pressure ulcer of left buttock, stage 3 Modifier: Quantity: 1 Physician Procedures : CPT4 Code Description Modifier [id number] - WC PHYS LEVEL 3 - EST PT 25 ICD-10 Diagnosis  Description L97.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, stage 3 L97.518  Non-pressure chronic ulcer of other part of right foot with other specified severity L97.521 Non-pressure chronic ulcer of other part of left foot limited to breakdown of skin Quantity: 1 : [id number] - WC PHYS DEBR WO ANESTH 20 SQ CM ICD-10 Diagnosis Description L89.323 Pressure ulcer of left buttock, stage 3 Quantity: 1 Electronic Signature(s) Signed: 06/01/2022 10:39:16 AM By: [name] Maudlin MD FACS Entered By: [name] on 06/01/2022 10:39:13

## 2022-06-10 DIAGNOSIS — G8220 Paraplegia, unspecified: Secondary | ICD-10-CM | POA: Diagnosis not present

## 2022-06-15 ENCOUNTER — Encounter (HOSPITAL_BASED_OUTPATIENT_CLINIC_OR_DEPARTMENT_OTHER): Payer: BC Managed Care – PPO | Admitting: General Surgery

## 2022-06-15 DIAGNOSIS — L97522 Non-pressure chronic ulcer of other part of left foot with fat layer exposed: Secondary | ICD-10-CM | POA: Diagnosis not present

## 2022-06-15 DIAGNOSIS — L97512 Non-pressure chronic ulcer of other part of right foot with fat layer exposed: Secondary | ICD-10-CM | POA: Diagnosis not present

## 2022-06-15 DIAGNOSIS — L97311 Non-pressure chronic ulcer of right ankle limited to breakdown of skin: Secondary | ICD-10-CM | POA: Diagnosis not present

## 2022-06-15 DIAGNOSIS — L89323 Pressure ulcer of left buttock, stage 3: Secondary | ICD-10-CM | POA: Diagnosis not present

## 2022-06-15 DIAGNOSIS — I87332 Chronic venous hypertension (idiopathic) with ulcer and inflammation of left lower extremity: Secondary | ICD-10-CM | POA: Diagnosis not present

## 2022-06-15 DIAGNOSIS — G8220 Paraplegia, unspecified: Secondary | ICD-10-CM | POA: Diagnosis not present

## 2022-06-15 NOTE — Progress Notes (Signed)
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 leg looks somewhat better. Is less surface slough 01/27/17; erythema is back to the chronic venous inflammation. The wound on the left leg is somewhat better. I am back to the point where I like to try an Apligraf once again 02/10/17; slight improvement in wound dimensions. Apligraf #2. He is completing his doxycycline 02/14/17; patient arrives today having completed doxycycline last Thursday. This was supposed to be [name] nurse visit however once again he hasn't tense erythema from the medial part of his wound extending over the lower leg. Also erythema in his foot this is roughly in the same distribution as last time. He has baseline chronic venous inflammation however this is [name] lot worse than the baseline I have learned to accept the on him is baseline inflammation 02/24/17- patient is here for follow-up evaluation. He is tolerating compression therapy. His voicing no complaints or concerns he is here anticipating an Apligraf 03/03/17; he arrives today with an adherent necrotic surface. I don't think this is surface is going to be amenable for Apligraf's. The erythema around his wound and on the left dorsal foot has resolved he is off antibiotics 03/10/17; better-looking surface today. I don't think he can tolerate Apligraf's. He tells me he had [name] wound VAC after [name] skin graft years ago to this area and they had difficulty with [name] seal. The erythema continues to be stable  around this some degree of chronic venous inflammation but he also has recurrent cellulitis. We have been using Iodoflex 03/17/17; continued improvement in the surface and may be small changes in dimensions. Using Iodoflex which seems the only thing that will control his surface 03/24/17- He is here for follow up evaluation of his LLE lateral ulceration and ulcer to right dorsal foot/toe space. He is voicing no complaints or concerns, He is tolerating compression wrap. 03/31/17 arrives today with [name] much healthier looking wound on the left lower extremity. We have been using Iodoflex for [name] prolonged period of time which has for the first time prepared and adequate looking wound bed although we have not had much in the way of wound dimension improvement. He also has [name] small wound between the first and second toe on the right 04/07/17; arrives today with [name] healthy-looking wound bed and at least the top 50% of this wound appears to be now her. No debridement was required I have changed him to [facility] last week after  prolonged Iodoflex. He did not do well with Apligraf's. We've had [name] re-opening between the first and second toe on the right 04/14/17; arrives today with [name] healthier looking wound bed contractions and the top 50% of this wound and some on the lesser 50%. Wound bed appears healthy. The area between the first and second toe on the right still remains problematic 04/21/17; continued very gradual improvement. Using [facility] 04/28/17; continued very gradual improvement in the left lateral leg venous insufficiency wound. His periwound erythema is very mild. We have been using Hydrofera Blue. Wound is making progress especially in the superior 50% 05/05/17; he continues to have very gradual improvement in the left lateral venous insufficiency wound. Both in terms with an length rings are improving. I debrided this every 2 weeks with #5 curet and we have been using Hydrofera Blue and again  making good progress With regards to the wounds between his right first and second toe which I thought might of been tinea pedis he is not making as much progress very dry scaly skin over the area. Also the area at the base of the left first and second toe in [name] similar condition 05/12/17; continued gradual improvement in the refractory left lateral venous insufficiency wound on the left. Dimension smaller. Surface still requiring debridement using Hydrofera Blue 05/19/17; continued gradual improvement in the refractory left lateral venous ulceration. Careful inspection of the wound bed underlying rumination suggested some degree of epithelialization over the surface no debridement indicated. Continue Hydrofera Blue difficult areas between his toes first and third on the left than first and second on the right. I'[name] going to change to silver alginate from silver collagen. Continue ketoconazole as I suspect underlying tinea pedis 05/26/17; left lateral leg venous insufficiency wound. We've been using Hydrofera Blue. I believe that there is expanding epithelialization over the surface of the wound albeit not coming from the wound circumference. This is [name] bit of an odd situation in which the epithelialization seems to be coming from the surface of the wound rather than in the exact circumference. There is still small open areas mostly along the lateral margin of the wound. He has unchanged areas between the left first and second and the right first second toes which I been treating for tenia pedis 06/02/17; left lateral leg venous insufficiency wound. We have been using Hydrofera Blue. Somewhat smaller from the wound circumference. The surface of the wound remains [name] bit on it almost epithelialized sedation in appearance. I use an open curette today debridement in the surface of all of this especially the edges Small open wounds remaining on the dorsal right first and second toe interspace and the plantar left  first second toe and her face on the left 06/09/17; wound on the left lateral leg continues to be smaller but very gradual and very dry surface using Hydrofera Blue 06/16/17 requires weekly debridements now on the left lateral leg although this continues to contract. I changed to silver collagen last week because of dryness of the wound bed. Using Iodoflex to the areas on his first and second toes/web space bilaterally 06/24/17; patient with history of paraplegia also chronic venous insufficiency with lymphedema. Has [name] very difficult wound on the left lateral leg. This has been gradually reducing in terms of with but comes in with [name] very dry adherent surface. High switch to silver collagen [name] week or so ago with hydrogel to keep the area moist. This is been refractory to multiple dressing attempts. He also  has areas in his first and second toes bilaterally in the anterior and posterior web space. I had been using Iodoflex here after [name] prolonged course of silver alginate with ketoconazole was ineffective [question tinea pedis] 07/14/17; patient arrives today with [name] very difficult adherent material over his left lateral lower leg wound. He also has surrounding erythema and poorly controlled edema. He was switched his Santyl last visit which the nurses are applying once during his doctor visit and once on [name] nurse visit. He was also reduced to 2 layer compression I'[name] not exactly sure of the issue here. 07/21/17; better surface today after 1 week of Iodoflex. Significant cellulitis that we treated last week also better. [Doxycycline] 07/28/17 better surface today with now 2 weeks of Iodoflex. Significant cellulitis treated with doxycycline. He has now completed the doxycycline and he is back to his usual degree of chronic venous inflammation/stasis dermatitis. He reminds me he has had ablations surgery here 08/04/17; continued improvement with Iodoflex to the left lateral leg wound in terms of the surface of the  wound although the dimensions are better. 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 Problematic areas on the plantar aspect of the first second toe web space on the left and the dorsal aspect of the first second toe web space on the right. At one point I felt these were probably related to chronic fungal infections in treated him aggressively for this although we have not made any improvement here. 08/11/17; left lateral leg. Surface continues to improve with the Iodoflex although we are not seeing much improvement in overall wound dimensions. Areas on his plantar left foot and right foot show no improvement. In fact the right foot looks somewhat worse 08/18/17; left lateral leg. We changed to [facility] Blue last week after [name] prolonged course of Iodoflex which helps get the surface better. It appears that the wound with is improved. Continue with difficult areas on the left dorsal first second and plantar first second on the right 09/01/17; patient arrives in clinic today having had [name] temperature of 103 yesterday. He was seen in the ER and [facility]. The patient was concerned he could have cellulitis again in the right leg however they diagnosed him with [name] UTI and he is now on Keflex. He has [name] history of cellulitis which is been recurrent and difficult but this is been in the left leg, in the past 5 use doxycycline. He does in and out catheterizations at home which are risk factors for UTI 09/08/17; patient will be completing his Keflex this weekend. The erythema on the left leg is considerably better. He has [name] new wound today on the medial part of the right leg small superficial almost looks like [name] skin tear. He has worsening of the area on the right dorsal first and second toe. His major area on the left lateral leg is better. Using Hydrofera Blue on all areas 09/15/17; gradual reduction in width on the long wound in the left  lateral leg. No debridement required. He also has wounds on the plantar aspect of his left first second toe web space and on the dorsal aspect of the right first second toe web space. 09/22/17; there continues to be very gradual improvements in the dimensions of the left lateral leg wound. He hasn't round erythematous spot with might be pressure on his wheelchair. There is no evidence obviously of infection no purulence no warmth He has [name] dry  scaled area on the plantar aspect of the left first second toe Improved area on the dorsal right first second toe. 09/29/17; left lateral leg wound continues to improve in dimensions mostly with an is still [name] fairly long but increasingly narrow wound. He has [name] dry scaled area on the plantar aspect of his left first second toe web space Increasingly concerning area on the dorsal right first second toe. In fact I am concerned today about possible cellulitis around this wound. The areas extending up his second toe and although there is deformities here almost appears to abut on the nailbed. 10/06/17; left lateral leg wound continues to make very gradual progress. Tissue culture I did from the right first second toe dorsal foot last time grew MRSA and enterococcus which was vancomycin sensitive. This was not sensitive to clindamycin or doxycycline. He is allergic to Zyvox and sulfa we have therefore arrange for him to have dalvance infusion tomorrow. He is had this in the past and tolerated it well 10/20/17; left lateral leg wound continues to make decent progress. This is certainly reduced in terms of with there is advancing epithelialization.The cellulitis in the right foot looks better although he still has [name] deep wound in the dorsal aspect of the first second toe web space. Plantar left first toe web space on the left I think is making some progress 10/27/17; left lateral leg wound continues to make decent progress. Advancing epithelialization.using Hydrofera  Blue The right first second toe web space wound is better-looking using silver alginate Improvement in the left plantar first second toe web space. Again using silver alginate 11/03/17 left lateral leg wound continues to make decent progress albeit slowly. Using [facility] The right per second toe web space continues to be [name] very problematic looking punched out wound. I obtained [name] piece of tissue for deep culture I did extensively treated this for fungus. It is difficult to imagine that this is [name] pressure area as the patient states other than going outside he doesn't really wear shoes at home The left plantar first second toe web space looked fairly senescent. Necrotic edges. This required debridement change to [facility] Blue to all wound areas 11/10/17; left lateral leg wound continues to contract. Using Hydrofera Blue On the right dorsal first second toe web space dorsally. Culture I did of this area last week grew MRSA there is not an easy oral option in this patient was multiple antibiotic allergies or intolerances. This was only [name] rare culture isolate I'[name] therefore going to use Bactroban under silver alginate On the left plantar first second toe web space. Debridement is required here. This is also unchanged 11/17/17; left lateral leg wound continues to contract using Hydrofera Blue this is no longer the major issue. The major concern here is the right first second toe web space. He now has an open area going from dorsally to the plantar aspect. There is now wound on the inner lateral part of the first toe. Not [name] very viable surface on this. There is erythema spreading medially into the forefoot. No major change in the left first second toe plantar wound 11/24/17; left lateral leg wound continues to contract using Hydrofera Blue. Nice improvement today The right first second toe web space all of this looks [name] lot less angry than last week. I have given him clindamycin and topical Bactroban  for MRSA and terbinafine for the possibility of underlining tinea pedis that I could not control with ketoconazole. Looks somewhat better The  area on the plantar left first second toe web space is weeping with dried debris around the wound 12/01/17; left lateral leg wound continues to contract he Hydrofera Blue. It is becoming thinner in terms of with nevertheless it is making good improvement. The right first second toe web space looks less angry but still [name] large necrotic-looking wounds starting on the plantar aspect of the right foot extending between the toes and now extensively on the base of the right second toe. I gave him clindamycin and topical Bactroban for MRSA anterior benefiting for the possibility of underlying tinea pedis. Not looking better today The area on the left first/second toe looks better. Debrided of necrotic debris 12/05/17* the patient was worked in urgently today because over the weekend he found blood on his incontinence bad when he woke up. He was found to have an ulcer by his wife who does most of his wound care. He came in today for [place] to look at this. He has not had [name] history of wounds in his buttocks in spite of his paraplegia. 12/08/17; seen in follow-up today at his usual appointment. He was seen earlier this week and found to have [name] new wound on his buttock. We also follow him for wounds on the left lateral leg, left first second toe web space and right first second toe web space 12/15/17; we have been using Hydrofera Blue to the left lateral leg which has improved. The right first second toe web space has also improved. Left first second toe web space plantar aspect looks stable. The left buttock has worsened using Santyl. Apparently the buttock has drainage 12/22/17; we have been using Hydrofera Blue to the left lateral leg which continues to improve now 2 small wounds separated by normal skin. He tells [place] he had [name] fever up to 100 yesterday he is prone to UTIs but  has not noted anything different. He does in and out catheterizations. The area between the first and second toes today does not look good necrotic surface covered with what looks to be purulent drainage and erythema extending into the third toe. I had gotten this to something that I thought look better last time however it is not look good today. He also has [name] necrotic surface over the buttock wound which is expanded. I thought there might be infection under here so I removed [name] lot of the surface with [name] #5 curet though nothing look like it really needed culturing. He is been using Santyl to this area 12/27/17; his original wound on the left lateral leg continues to improve using Hydrofera Blue. I gave him samples of Baxdella although he was unable to take them out of fear for an allergic reaction ["lump in his throat"].the culture I did of the purulent drainage from his second toe last week showed both enterococcus and [name] set Enterobacter I was also concerned about the erythema on the bottom of his foot although paradoxically although this looks somewhat better today. 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 We've been using silver alginate here. They came up today that he sleeps in [name] recliner, I'[name] not sure why but I asked him to stop this 01/03/18; his original wound we've been using Hydrofera Blue is now separated into 2 areas. Ulcer on his left buttock is better he is off the recliner and sleeping in bed Finally both wound areas between his first and  second toes also looks some better 01/10/18; his original wound on the left lateral leg is now separated into 2 wounds we've been using Hydrofera Blue Ulcer on his left buttock has some drainage. There is [name] small probing site going into muscle layer superiorly.using silver alginate -He arrives today with [name] deep tissue injury on the left heel The wound on the  dorsal aspect of his first second toe on the left looks [name] lot betterusing silver alginate ketoconazole The area on the first second toe web space on the right also looks [name] lot bette 01/17/18; his original wound on the left lateral leg continues to progress using Hydrofera Blue Ulcer on his left buttock also is smaller surface healthier except for [name] small probing site going into the muscle layer superiorly. 2.4 cm of tunneling in this area DTI on his left heel we have only been offloading. Looks better than last week no threatened open no evidence of infection the wound on the dorsal aspect of the first second toe on the left continues to look like it's regressing we have only been using silver alginate and terbinafine orally The area in the first second toe web space on the right also looks to be [name] lot better using silver alginate and terbinafine I think this was prompted by tinea pedis 01/31/18; the patient was hospitalized in [place] last week apparently for [name] complicated UTI. He was discharged on cefepime he does in and out catheterizations. In the hospital he was discovered [name] I don't mild elevation of AST and ALT and the terbinafine was stopped.predictably the pressure ulcer on s his buttock looks betterusing silver alginate. The area on the left lateral leg also is better using Hydrofera Blue. The area between the first and second toes on the left better. First and second toes on the right still substantial but better. Finally the DTI on the left heel has held together and looks like it's resolving 02/07/18-he is here in follow-up evaluation for multiple ulcerations. He has new injury to the lateral aspect of the last issue [name] pressure ulcer, he states this is from adhesive removal trauma. He states he has tried multiple adhesive products with no success. All other ulcers appear stable. The left heel DTI is resolving. We will continue with same treatment plan and follow-up next week. 02/14/18;  follow-up for multiple areas. He has [name] new area last week on the lateral aspect of his pressure ulcer more over the posterior trochanter. The original pressure ulcer looks quite stable has healthy granulation. 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 Almost fully epithelialized on the original superior area using Hydrofera Blue DTI on the left heel has peeled off this week to reveal [name] small superficial wound under denuded skin and subcutaneous tissue Both areas between the first and second toes look better including nothing open on the left 02/21/18; The patient's wounds on his left ischial tuberosity and posterior left greater trochanter actually looked better. He has [name] large area of irritation around the area which I think is contact dermatitis. I am doubtful that this is fungal His original wound on the left lateral calf continues to improve we have been using Hydrofera Blue There is no open area in the left first second toe web space although there is [name] lot of thick callus The DTI on the left heel required debridement today of necrotic surface eschar and subcutaneous tissue  using silver alginate Finally the area on the right first second toe webspace continues to contract using silver alginate and ketoconazole 02/28/18 Left ischial tuberosity wounds look better using silver alginate. Original wound on the left calf only has one small open area left using Hydrofera Blue DTI on the left heel required debridement mostly removing skin from around this wound surface. Using silver alginate The areas on the right first/second toe web space using silver alginate and ketoconazole 03/08/18 on evaluation today patient appears to be doing decently well as best I can tell in regard to his wounds. This is the first time that I have seen him as he generally is followed by Dr. Dellia Nims. With that being said  none of his wounds appear to be infected he does have an area where there is some skin covering what appears to be [name] new wound on the left dorsal surface of his great toe. This is right at the nail bed. With that being said I do believe that debrided away some of the excess skin can be of benefit in this regard. Otherwise he has been tolerating the dressing changes without complication. 03/14/18; patient arrives today with the multiplicity of wounds that we are following. He has not been systemically unwell Original wound on the left lateral calf now only has 2 small open areas we've been using Hydrofera Blue which should continue The deep tissue injury on the left heel requires debridement today. We've been using silver alginate The left first second toe and the right first second toe are both are reminiscence what I think was tinea pedis. Apparently some of the callus Surface between the toes was removed last week when it started draining. Purulent drainage coming from the wound on the ischial tuberosity on the left. 03/21/18-He is here in follow-up evaluation for multiple wounds. 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 He tolerated debridement. The only change to last week's recommendations is to discontinue antifungal cream between toes. He will follow-up next week 03/28/18; following up for multiple wounds;Concern this week is streaking redness and swelling in the right foot. He is going to need antibiotics for this. 03/31/18; follow-up for right foot cellulitis. Streaking redness and swelling in the right foot on 03/28/18. 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 I put him on clindamycin 300 mg every 6 and brought him in for [name] quick check. He has an open wound between his first and second toes on the right foot as [name] potential source. 04/04/18; Right foot cellulitis is resolving he is completing  clindamycin. This is truly good news Left lateral calf wound which is initial wound only has one small open area inferiorly this is close to healing out. He has compression stockings. We will use Hydrofera Blue right down to the epithelialization of this Nonviable surface on the left heel which was initially pressure with [name] DTI. We've been using Hydrofera Blue. I'[name] going to switch this back to silver alginate Left first second toe/tinea pedis this looks better using silver alginate Right first second toe tinea pedis using silver alginate Large pressure ulcers on theLeft ischial tuberosity. Small wound here Looks better. I am uncertain about the surface over the large wound. Using silver alginate 04/11/18; Cellulitis in the right foot is resolved Left lateral calf wound which was his original wounds still has 2 tiny open areas remaining this is just about closed Nonviable surface on the left heel is  better but still requires debridement Left first second toe/tinea pedis still open using silver alginate Right first second toe wound tinea pedis I asked him to go back to using ketoconazole and silver alginate Large pressure ulcers on the left ischial tuberosity this shear injury here is resolved. Wound is smaller. No evidence of infection using silver alginate 04/18/18; Patient arrives with an intense area of cellulitis in the right mid lower calf extending into the right heel area. Bright red and warm. Smaller area on the left anterior leg. He has [name] significant history of MRSA. He will definitely need antibioticsdoxycycline He now has 2 open areas on the left ischial tuberosity the original large wound and now [name] satellite area which I think was above his initial satellite areas. Not [name] wonderful surface on this satellite area surrounding erythema which looks like pressure related. His left lateral calf wound again his original wound is just about closed Left heel pressure injury still requiring  debridement Left first second toe looks [name] lot better using silver alginate Right first second toe also using silver alginate and ketoconazole cream also looks better 04/20/18; the patient was worked in early today out of concerns with his cellulitis on the right leg. I had started him on doxycycline. This was 2 days ago. His wife was concerned about the swelling in the area. Also concerned about the left buttock. He has not been systemically unwell no fever chills. No nausea vomiting or diarrhea 04/25/18; the patient's left buttock wound is continued to deteriorate he is using Hydrofera Blue. He is still completing clindamycin for the cellulitis on the right leg although all of this looks better. 05/02/18 Left buttock wound still with [name] lot of drainage and [name] very tightly adherent fibrinous necrotic surface. He has [name] deeper area superiorly The left lateral calf wound is still closed DTI wound on the left heel necrotic surface especially the circumference using Iodoflex Areas between his left first second toe and right first second toe both look better. Dorsally and the right first second toe he had [name] necrotic surface although at smaller. In using silver alginate and ketoconazole. I did [name] culture last week which was [name] deep tissue culture of the reminiscence of the open wound on the right first second toe dorsally. This grew [name] few Acinetobacter and [name] few methicillin-resistant staph aureus. Nevertheless the area actually this week looked better. I didn't feel the need to specifically address this at least in terms of systemic antibiotics. 05/09/18; wounds are measuring larger more drainage per our intake. We are using Santyl covered with alginate on the large superficial buttock wounds, Iodosorb on the left heel, ketoconazole and silver alginate to the dorsal first and second toes bilaterally. 05/16/18; The area on his left buttock better in some aspects although the area superiorly over the ischial  tuberosity required an extensive debridement.using Santyl Left heel appears stable. Using Iodoflex The areas between his first and second toes are not bad however there is spreading erythema up the dorsal aspect of his left foot this looks like cellulitis again. He is insensate the erythema is really very brilliant.o Erysipelas He went to see an allergist days ago because he was itching part of this he had lab work done. This showed [name] white count of 15.1 with 70% neutrophils. Hemoglobin of 11.4 and [name] platelet count of 659,000. Last white count we had in [facility] was [name] 2-1/2 years ago which was 25.9 but he was ill at the time. He was able  to show me some lab work that was done by his primary physician the pattern is about the same. 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 But prompted me to go ahead and do x-rays of both feet and the pelvis rule out osteomyelitis. He also had [name] comprehensive metabolic panel this was reasonably normal his albumin was 3.7 liver function tests BUN/creatinine all normal 05/23/18; x-rays of both his feet from last week were negative for underlying pulmonary abnormality. The x-ray of his pelvis however showed mild irregularity in the left ischial which may represent some early osteomyelitis. The wound in the left ischial continues to get deeper clearly now exposed muscle. Each week necrotic surface material over this area. Whereas the rest of the wounds do not look so bad. The left ischial wound we have been using Santyl and calcium alginate T the left heel surface necrotic debris using Iodoflex o The left lateral leg is still healed Areas on the left dorsal foot and the right dorsal foot are about the same. There is some inflammation on the left which might represent contact dermatitis, fungal dermatitis I am doubtful cellulitis although this looks better than last week 05/30/18; CT scan done at Hospital did not show any  osteomyelitis or abscess. Suggested the possibility of underlying cellulitis although I don't see [name] lot of evidence of this at the bedside The wound itself on the left buttock/upper thigh actually looks somewhat better. No debridement Left heel also looks better no debridement continue Iodoflex Both dorsal first second toe spaces appear better using Lotrisone. Left still required debridement 06/06/18; Intake reported some purulent looking drainage from the left gluteal wound. Using Santyl and calcium alginate Left heel looks better although still [name] nonviable surface requiring debridement The left dorsal foot first/second webspace actually expanding and somewhat deeper. I may consider doing [name] shave biopsy of this area Right dorsal foot first/second webspace appears stable to improved. Using Lotrisone and silver alginate to both these areas 06/13/18 Left gluteal surface looks better. Now separated in the 2 wounds. No debridement required. Still drainage. We'll continue silver alginate Left heel continues to look better with Iodoflex continue this for at least another week Of his dorsal foot wounds the area on the left still has some depth although it looks better than last week. We've been using Lotrisone and silver alginate 06/20/18 Left gluteal continues to look better healthy tissue Left heel continues to look better healthy granulation wound is smaller. He is using Iodoflex and his long as this continues continue the Iodoflex Dorsal right foot looks better unfortunately dorsal left foot does not. There is swelling and erythema of his forefoot. He had minor trauma to this several days ago but doesn't think this was enough to have caused any tissue injury. Foot looks like cellulitis, we have had this problem before 06/27/18 on evaluation today patient appears to be doing [name] little worse in regard to his foot ulcer. 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 Both of which would really be his only options for treating this infection. In the past he has been given and effusion of Orbactiv. This is done very well for him in the past again it's one time dosing IV antibiotic therapy. Subsequently I do believe this is something we're gonna need to see about doing  at this point in time. Currently his other wounds seem to be doing somewhat better in my pinion I'[name] pretty happy in that regard. 07/03/18 on evaluation today patient's wounds actually appear to be doing fairly well. He has been tolerating the dressing changes without complication. All in all he seems to be showing signs of improvement. In regard to the antibiotics he has been dealing with infectious disease since I saw him last week as far as getting this scheduled. In the end [facility]'s [facility] this coming Friday. In the meantime he has been continuing to perform the dressing changes in such as previous. There does not appear to be any evidence of infection worsengin at this time. 07/10/18; Since I last saw this man 2 weeks ago things have actually improved. IV antibiotics of resulted in less forefoot erythema although there is still some present. He is not systemically unwell Left buttock wounds 2 now have no depth there is increased epithelialization Using silver alginate Left heel still requires debridement using Iodoflex Left dorsal foot still with [name] sizable wound about the size of [name] border but healthy granulation Right dorsal foot still with [name] slitlike area using silver alginate 07/18/18; the patient's cellulitis in the left foot is improved in fact I think it is on its way to resolving. Left buttock wounds 2 both look better although the larger one has hypertension granulation we've been using silver alginate Left heel has some thick  circumferential redundant skin over the wound edge which will need to be removed today we've been using Iodoflex Left dorsal foot is still [name] sizable wound required debridement using silver alginate The right dorsal foot is just about closed only [name] small open area remains here 07/25/18; left foot cellulitis is resolved Left buttock wounds 2 both look better. Hyper-granulation on the major area Left heel as some debris over the surface but otherwise looks [name] healthier wound. Using silver collagen Right dorsal foot is just about closed 07/31/18; arrives with our intake nurse worried about purulent drainage from the buttock. We had hyper-granulation here last week His buttock wounds 2 continue to look better Left heel some debris over the surface but measuring smaller. Right dorsal foot unfortunately has openings between the toes Left foot superficial wound looks less aggravated. 08/07/18 Buttock wounds continue to look better although some of her granulation and the larger medial wound. silver alginate Left heel continues to look [name] lot better.silver collagen Left foot superficial wound looks less stable. Requires debridement. He has [name] new wound superficial area on the foot on the lateral dorsal foot. Right foot looks better using silver alginate without Lotrisone 08/14/2018; patient was in the ER last week diagnosed with [name] UTI. He is now on Cefpodoxime and Macrodantin. Buttock wounds continued to be smaller. Using silver alginate Left heel continues to look better using silver collagen Left foot superficial wound looks as though it is improving Right dorsal foot area is just about healed. 08/21/2018; patient is completed his antibiotics for his UTI. He has 2 open areas on the buttocks. There is still not closed although the surface looks satisfactory. Using silver alginate Left heel continues to improve using silver collagen The bilateral dorsal foot areas which are at the base of his first and second  toes/possible tinea pedis are actually stable on the left but worse on the right. The area on the left required debridement of necrotic surface. After debridement I obtained  [name] specimen for PCR culture. The right dorsal foot which is been just about healed last week is now reopened 08/28/2018; culture done on the left dorsal foot showed coag negative staph both staph epidermidis and Lugdunensis. I think this is worthwhile initiating systemic treatment. I will use doxycycline given his long list of allergies. The area on the left heel slightly improved but still requiring debridement. The large wound on the buttock is just about closed whereas the smaller one is larger. Using silver alginate in this area 09/04/2018; patient is completing his doxycycline for the left foot although this continues to be [name] very difficult wound area with very adherent necrotic debris. We are using silver alginate to all his wounds right foot left foot and the small wounds on his buttock, silver collagen on the left heel. 09/11/2018; once again this patient has intense erythema and swelling of the left forefoot. Lesser degrees of erythema in the right foot. He has [name] long list of allergies and intolerances. I will reinstitute doxycycline. 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 Using silver alginate Left heel also looks better using silver collagen Unfortunately both the areas on his feet look worse. The area on the left first second webspace is now gone through to the plantar part of his foot. The area on the left foot anteriorly is irritated with erythema and swelling in the forefoot. 09/25/2018 His wound on the left plantar heel looks better. Using silver collagen The area on the left buttock 2 small remnant areas. One is closed one is still open. Using silver alginate The areas between both his first and second toes look worse. This in spite of long-standing antifungal  therapy with ketoconazole and silver alginate which should have antifungal activity He has small areas around his original wound on the left calf one is on the bottom of the original scar tissue and one superiorly both of these are small and superficial but again given wound history in this site this is worrisome 10/02/2018 Left plantar heel continues to gradually contract using silver collagen Left buttock wound is unchanged using silver alginate The areas on his dorsal feet between his first and second toes bilaterally look about the same. I prescribed clindamycin ointment to see if we can address chronic staph colonization and also the underlying possibility of erythrasma The left lateral lower extremity wound is actually on the lateral part of his ankle. Small open area here. We have been using silver alginate 10/09/2018; Left plantar heel continues to look healthy and contract. No debridement is required Left buttock slightly smaller with [name] tape injury wound just below which was new this week Dorsal feet somewhat improved I have been using clindamycin Left lateral looks lower extremity the actual open area looks worse although [name] lot of this is epithelialized. I am going to change to silver collagen today He has [name] lot more swelling in the right leg although this is not pitting not red and not particularly warm there is [name] lot of spasm in the right leg usually indicative of people with paralysis of some underlying discomfort. We have reviewed his vascular status from 2017 he had [name] left greater saphenous vein ablation. I wonder about referring him back to vascular surgery if the area on the left leg continues to deteriorate. 10/16/2018 in today for follow-up and management of multiple lower extremity ulcers. His left Buttock wound is much lower smaller and almost closed completely.  The wound to the left ankle has began to reopen with Epithelialization and some adherent slough. He has multiple  new areas to the left foot and leg. The left dorsal foot without much improvement. Wound present between left great webspace and 2nd toe. Erythema and edema present right leg. Right LE ultrasound obtained on 10/10/18 was negative for DVT . 10/23/2018; Left buttock is closed over. Still dry macerated skin but there is no open wound. I suspect this is chronic pressure/moisture Left lateral calf is quite [name] bit worse than when I saw this last. There is clearly drainage here he has macerated skin into the left plantar heel. We will change the primary dressing to alginate Left dorsal foot has some improvement in overall wound area. Still using clindamycin and silver alginate Right dorsal foot about the same as the left using clindamycin and silver alginate The erythema in the right leg has resolved. He is DVT rule out was negative Left heel pressure area required debridement although the wound is smaller and the surface is health 10/26/2018 The patient came back in for his nurse check today predominantly because of the drainage coming out of the left lateral leg with [name] recent reopening of his original wound on the left lateral calf. He comes in today with [name] large amount of surrounding erythema around the wound extending from the calf into the ankle and even in the area on the dorsal foot. He is not systemically unwell. He is not febrile. Nevertheless this looks like cellulitis. We have been using silver alginate to the area. I changed him to [name] regular visit and I am going to prescribe him doxycycline. The rationale here is [name] long list of medication intolerances and [name] history of MRSA. I did not see anything that I thought would provide [name] valuable culture 10/30/2018 Follow-up from his appointment 4 days ago with really an extensive area of cellulitis in the left calf left lateral ankle and left dorsal foot. I put him on doxycycline. He has [name] long list of medication allergies which are true allergy reactions.  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 In any case he is [name] lot better today. The erythema especially in the anterior and lateral left calf is better. He still has left ankle erythema. He also is complaining about increasing edema in the right leg we have only been using Kerlix Coban and he has been doing the wraps at home. Finally he has [name] spotty rash on the medial part of his upper left calf which looks like folliculitis or perhaps wrap occlusion type injury. Small superficial macules not pustules 11/06/18 patient arrives today with again [name] considerable degree of erythema around the wound on the left lateral calf extending into the dorsal ankle and dorsal foot. This is [name] lot worse than when I saw this last week. He is on doxycycline really with not [name] lot of improvement. He has not been systemically unwell Wounds on the; left heel actually looks improved. Original area on the left foot and proximity to the first and second toes looks about the same. He has superficial areas on the dorsal foot, anterior calf and then the reopening of his original wound on the left lateral calf which looks about the same The only area he has on the right is the dorsal webspace first and second which is smaller. He has [name] large area of dry erythematous skin on the left buttock  small open area here. 11/13/2018; the patient arrives in much better condition. The erythema around the wound on the left lateral calf is [name] lot better. Not sure whether this was the clindamycin or the TCA and ketoconazole or just in the improvement in edema control [stasis dermatitis]. In any case this is [name] lot better. The area on the left heel is very small and just about resolved using silver collagen we have been using silver alginate to the areas on his dorsal feet 11/20/2018; his wounds include the left lateral calf, left heel, dorsal aspects of both feet just proximal to  the first second webspace. He is stable to slightly improved. I did not think any changes to his dressings were going to be necessary 11/27/2018 he has [name] reopening on the left buttock which is surrounded by what looks like tinea or perhaps some other form of dermatitis. The area on the left dorsal foot has some erythema around it I have marked this area but I am not sure whether this is cellulitis or not. Left heel is not closed. Left calf the reopening is really slightly longer and probably worse 1/13; in general things look better and smaller except for the left dorsal foot. Area on the left heel is just about closed, left buttock looks better only [name] small wound remains in the skin looks better [using Lotrisone] 1/20; the area on the left heel only has [name] few remaining open areas here. Left lateral calf about the same in terms of size, left dorsal foot slightly larger right lateral foot still not closed. The area on the left buttock has no open wound and the surrounding skin looks [name] lot better 1/27; the area on the left heel is closed. Left lateral calf better but still requiring extensive debridements. The area on his left buttock is closed. He still has the open areas on the left dorsal foot which is slightly smaller in the right foot which is slightly expanded. We have been using Iodoflex on these areas as well 2/3; left heel is closed. Left lateral calf still requiring debridement using Iodoflex there is no open area on his left buttock however he has dry scaly skin over [name] large area of this. Not really responding well to the Lotrisone. Finally the areas on his dorsal feet at the level of the first second webspace are slightly smaller on the right and about the same on the left. Both of these vigorously debrided with Anasept and gauze 2/10; left heel remains closed he has dry erythematous skin over the left buttock but there is no open wound here. Left lateral leg has come in and with.  Still requiring debridement we have been using Iodoflex here. Finally the area on the left dorsal foot and right dorsal foot are really about the same extremely dry callused fissured areas. He does not yet have [name] dermatology appointment 2/17; left heel remains closed. He has [name] new open area on the left buttock. The area on the left lateral calf is bigger longer and still covered in necrotic debris. No major change in his foot areas bilaterally. I am awaiting for [name] dermatologist to look on this. We have been using ketoconazole I do not know that this is been doing any good at all. 2/24; left heel remains closed. The left buttock wound that was new reopening last week looks better. The left lateral calf appears better also although still requires debridement. The major area on his foot is the left first  second also requiring debridement. We have been putting Prisma on all wounds. I do not believe that the ketoconazole has done too much good for his feet. He will use Lotrisone I am going to give him [name] 2-week course of terbinafine. We still do not have [name] dermatology appointment 3/2 left heel remains closed however there is skin over bone in this area I pointed this out to him today. The left buttock wound is epithelialized but still does not look completely stable. The area on the left leg required debridement were using silver collagen here. With regards to his feet we changed to Lotrisone last week and silver alginate. 3/9; left heel remains closed. Left buttock remains closed. The area on the right foot is essentially closed. The left foot remains unchanged. Slightly smaller on the left lateral calf. Using silver collagen to both of these areas 3/16-Left heel remains closed. Area on right foot is closed. Left lateral calf above the lateral malleolus open wound requiring debridement with easy bleeding. Left dorsal wound proximal to first toe also debrided. Left ischial area open new. Patient has been  using Prisma with wrapping every 3 days. Dermatology appointment is apparently tomorrow.Patient has completed his terbinafine 2-week course with some apparent improvement according to him, there is still flaking and dry skin in his foot on the left 3/23; area on the right foot is reopened. The area on the left anterior foot is about the same still [name] very necrotic adherent surface. He still has the area on the left leg and reopening is on the left buttock. He apparently saw dermatology although I do not have [name] note. According to the patient who is usually fairly well informed they did not have any good ideas. Put him on oral terbinafine which she is been on before. 3/30; using silver collagen to all wounds. Apparently his dermatologist put him on doxycycline and rifampin presumably some culture grew staph. I do not have this result. He remains on terbinafine although I have used terbinafine on him before 4/6; patient has had [name] fairly substantial reopening on the right foot between the first and second toes. He is finished his terbinafine and I believe is on doxycycline and rifampin still as prescribed by dermatology. We have been using silver collagen to all his wounds although the patient reports that he thinks silver alginate does better on the wounds on his buttock. 4/13; the area on his left lateral calf about the same size but it did not require debridement. Left dorsal foot just proximal to the webspace between the first and second toes is about the same. Still nonviable surface. I note some superficial bronze discoloration of the dorsal part of his foot Right dorsal foot just proximal to the first and second toes also looks about the same. I still think there may be the same discoloration I noted above on the left Left buttock wound looks about the same 4/20; left lateral calf appears to be gradually contracting using silver collagen. He remains on erythromycin empiric treatment for possible  erythrasma involving his digital spaces. The left dorsal foot wound is debrided of tightly adherent necrotic debris and really cleans up quite nicely. The right area is worse with expansion. I did not debride this it is now over the base of the second toe The area on his left buttock is smaller no debridement is required using silver collagen 5/4; left calf continues to make good progress. He arrives with erythema around the wounds on his dorsal  foot which even extends to the plantar aspect. Very concerning for coexistent infection. He is finished the erythromycin I gave him for possible erythrasma this does not seem to have helped. The area on the left foot is about the same base of the dorsal toes Is area on the buttock looks improved on the left 5/11; left calf and left buttock continued to make good progress. Left foot is about the same to slightly improved. Major problem is on the right foot. He has not had an x-ray. Deep tissue culture I did last week showed both Enterobacter and E. coli. I did not change the doxycycline I put him on empirically although neither 1 of these were plated to doxycycline. He arrives today with the erythema looking worse on both the dorsal and plantar foot. Macerated skin on the bottom of the foot. he has not been systemically unwell 5/18-Patient returns at 1 week, left calf wound appears to be making some progress, left buttock wound appears slightly worse than last time, left foot wound looks slightly better, right foot redness is marginally better. X-ray of both feet show no air or evidence of osteomyelitis. Patient is finished his Omnicef and terbinafine. He continues to have macerated skin on the bottom of the left foot as well as right 5/26; left calf wound is better, left buttock wound appears to have multiple small superficial open areas with surrounding macerated skin. X-rays that I did last time showed no evidence of osteomyelitis in either foot. He is  finished cefdinir and doxycycline. I do not think that he was on terbinafine. He continues to have [name] large superficial open area on the right foot anterior dorsal and slightly between the first and second toes. I did send him to dermatology 2 months ago or so wondering about whether they would do [name] fungal scraping. I do not believe they did but did do [name] culture. We have been using silver alginate to the toe areas, he has been using antifungals at home topically either ketoconazole or Lotrisone. We are using silver collagen on the left foot, silver alginate on the right, silver collagen on the left lateral leg and silver alginate on the left buttock 6/1; left buttock area is healed. We have the left dorsal foot, left lateral leg and right dorsal foot. We are using silver alginate to the areas on both feet and silver collagen to the area on his left lateral calf 6/8; the left buttock apparently reopened late last week. He is not really sure how this happened. He is tolerating the terbinafine. Using silver alginate to all wounds 6/15; left buttock wound is larger than last week but still superficial. Came in the clinic today with [name] report of purulence from the left lateral leg I did not identify any infection Both areas on his dorsal feet appear to be better. He is tolerating the terbinafine. Using silver alginate to all wounds 6/22; left buttock is about the same this week, left calf quite [name] bit better. His left foot is about the same however he comes in with erythema and warmth in the right forefoot once again. Culture that I gave him in the beginning of May showed Enterobacter and E. coli. I gave him doxycycline and things seem to improve although neither 1 of these organisms was specifically plated. 6/29; left buttock is larger and dry this week. Left lateral calf looks to me to be improved. Left dorsal foot also somewhat improved right foot completely unchanged. The erythema on the  right foot is  still present. He is completing the Ceftin dinner that I gave him empirically [see discussion above.) 7/6 - All wounds look to be stable and perhaps improved, the left buttock wound is slightly smaller, per patient bleeds easily, completed ceftin, the right foot redness is less, he is on terbinafine 7/13; left buttock wound about the same perhaps slightly narrower. Area on the left lateral leg continues to narrow. Left dorsal foot slightly smaller right foot about the same. We are using silver alginate on the right foot and Hydrofera Blue to the areas on the left. Unna boot on the left 2 layer compression on the right 7/20; left buttock wound absolutely the same. Area on lateral leg continues to get better. Left dorsal foot require debridement as did the right no major change in the 7/27; left buttock wound the same size necrotic debris over the surface. The area on the lateral leg is closed once again. His left foot looks better right foot about the same although there is some involvement now of the posterior first second toe area. He is still on terbinafine which I have given him for [name] month, not certain [name] centimeter major change 06/25/19-All wounds appear to be slightly improved according to report, left buttock wound looks clean, both foot wounds have minimal to no debris the right dorsal foot has minimal slough. We are using Hydrofera Blue to the left and silver alginate to the right foot and ischial wound. 8/10-Wounds all appear to be around the same, the right forefoot distal part has some redness which was not there before, however the wound looks clean and small. Ischial wound looks about the same with no changes 8/17; his wound on the left lateral calf which was his original chronic venous insufficiency wound remains closed. Since I last saw him the areas on the left dorsal foot right dorsal foot generally appear better but require debridement. The area on his left initial tuberosity appears  somewhat larger to me perhaps hyper granulated and bleeds very easily. We have been using Hydrofera Blue to the left dorsal foot and silver alginate to everything else 8/24; left lateral calf remains closed. The areas on his dorsal feet on the webspace of the first and second toes bilaterally both look better. The area on the left buttock which is the pressure ulcer stage II slightly smaller. I change the dressing to Hydrofera Blue to all areas 8/31; left lateral calf remains closed. The area on his dorsal feet bilaterally look better. Using Hydrofera Blue. Still requiring debridement on the left foot. No change in the left buttock pressure ulcers however 9/14; left lateral calf remains closed. Dorsal feet look quite [name] bit better than 2 weeks ago. Flaking dry skin also [name] lot better with the ammonium lactate I gave him 2 weeks ago. The area on the left buttock is improved. He states that his Roho cushion developed [name] leak and he is getting [name] new one, in the interim he is offloading this vigorously 9/21; left calf remains closed. Left heel which was [name] possible DTI looks better this week. He had macerated tissue around the left dorsal foot right foot looks satisfactory and improved left buttock wound. I changed his dressings to his feet to silver alginate bilaterally. Continuing Hydrofera Blue on the left buttock. 9/28 left calf remains closed. Left heel did not develop anything [possible DTI] dry flaking skin on the left dorsal foot. Right foot looks satisfactory. Improved left buttock wound. We are using  silver alginate on his feet Hydrofera Blue on the buttock. I have asked him to go back to the Lotrisone on his feet including the wounds and surrounding areas 10/5; left calf remains closed. The areas on the left and right feet about the same. [name] lot of this is epithelialized however debris over the remaining open areas. He is using Lotrisone and silver alginate. The area on the left buttock using  Hydrofera Blue 10/26. Patient has been out for 3 weeks secondary to Covid concerns. He tested negative but I think his wife tested positive. He comes in today with the left foot substantially worse, right foot about the same. Even more concerning he states that the area on his left buttock closed over but then reopened and is considerably deeper in one aspect than it was before [stage III wound] 11/2; left foot really about the same as last week. Quarter sized wound on the dorsal foot just proximal to the first second toes. Surrounding erythema with areas of denuded epithelium. This is not really much different looking. Did not look like cellulitis this time however. Right foot area about the same.. We have been using silver alginate alginate on his toes Left buttock still substantial irritated skin around the wound which I think looks somewhat better. We have been using Hydrofera Blue here. 11/9; left foot larger than last week and [name] very necrotic surface. Right foot I think is about the same perhaps slightly smaller. Debris around the circumference also addressed. Unfortunately on the left buttock there is been [name] decline. Satellite lesions below the major wound distally and now [name] an additional one posteriorly we have been using Hydrofera Blue but I think this is [name] pressure issue 11/16; left foot ulcer dorsally again [name] very adherent necrotic surface. Right foot is about the same. Not much change in the pressure ulcer on his left buttock. 11/30; left foot ulcer dorsally basically the same as when I saw him 2 weeks ago. Very adherent fibrinous debris on the wound surface. Patient reports [name] lot of drainage as well. 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 We have been using Iodoflex, patient changed back to alginate because of the drainage. Area on his right dorsal foot really looks benign with [name] healthier surface certainly [name] lot better than on the left.  Left buttock wounds all improved using Hydrofera Blue 12/7; left dorsal foot again no improvement. Tightly adherent debris. PCR culture I did last week only showed likely skin contaminant. I have gone ahead and done [name] punch biopsy of this which is about the last thing in terms of investigations I can think to do. He has known venous insufficiency and venous hypertension and this could be the issue here. The area on the right foot is about the same left buttock slightly worse according to our intake nurse secondary to [facility] Blue sticking to the wound 12/14; biopsy of the left foot that I did last time showed changes that could be related to wound healing/chronic stasis dermatitis phenomenon no neoplasm. We have been using silver alginate to both feet. I change the one on the left today to Sorbact and silver alginate to his other 2 wounds 12/28; the patient arrives with the following problems; Major issue is the dorsal left foot which continues to be [name] larger deeper wound area. Still with [name] completely nonviable surface Paradoxically the area mirror image on the right on the right dorsal foot appears to be getting  better. He had some loss of dry denuded skin from the lower part of his original wound on the left lateral calf. Some of this area looked [name] little vulnerable and for this reason we put him in wrap that on this side this week The area on his left buttock is larger. He still has the erythematous circular area which I think is [name] combination of pressure, sweat. This does not look like cellulitis or fungal dermatitis 11/26/2019; -Dorsal left foot large open wound with depth. Still debris over the surface. Using Sorbact The area on the dorsal right foot paradoxically has closed over He has [name] reopening on the left ankle laterally at the base of his original wound that extended up into the calf. This appears clean. The left buttock wound is smaller but with very adherent necrotic debris over the  surface. We have been using silver alginate here as well The patient had arterial studies done in 2017. He had biphasic waveforms at the dorsalis pedis and posterior tibial bilaterally. ABI in the left was 1.17. Digit waveforms were dampened. He has slight spasticity in the great toes I do not think [name] TBI would be possible 1/11; the patient comes in today with [name] sizable reopening between the first and second toes on the right. This is not exactly in the same location where we have been treating wounds previously. According to our intake nurse this was actually fairly deep but 0.6 cm. The area on the left dorsal foot looks about the same the surface is somewhat cleaner using Sorbact, his MRI is in 2 days. We have not managed yet to get arterial studies. The new reopening on the left lateral calf looks somewhat better using alginate. The left buttock wound is about the same using alginate 1/18; the patient had his ARTERIAL studies which were quite normal. ABI in the right at 1.13 with triphasic/biphasic waveforms on the left ABI 1.06 again with triphasic/biphasic waveforms. It would not have been possible to have done [name] toe brachial index because of spasticity. We have been using Sorbac to the left foot alginate to the rest of his wounds on the right foot left lateral calf and left buttock 1/25; arrives in clinic with erythema and swelling of the left forefoot worse over the first MTP area. This extends laterally dorsally and but also posteriorly. Still has an area on the left lateral part of the lower part of his calf wound it is eschared and clearly not closed. Area on the left buttock still with surrounding irritation and erythema. Right foot surface wound dorsally. The area between the right and first and second toes appears better. 2/1; The left foot wound is about the same. Erythema slightly better I gave him [name] week of doxycycline empirically Right foot wound is more extensive extending between  the toes to the plantar surface Left lateral calf really no open surface on the inferior part of his original wound however the entire area still looks vulnerable Absolutely no improvement in the left buttock wound required debridement. 2/8; the left foot is about the same. Erythema is slightly improved I gave him clindamycin last week. Right foot looks better he is using Lotrimin and silver alginate He has [name] breakdown in the left lateral calf. Denuded epithelium which I have removed Left buttock about the same were using Hydrofera Blue 2/15; left foot is about the same there is less surrounding erythema. Surface still has tightly adherent debris which I have debriding however not making any  progress Right foot has [name] substantial wound on the medial right second toe between the first and second webspace. Still an open area on the left lateral calf distal area. Buttock wound is about the same 2/22; left foot is about the same less surrounding erythema. Surface has adherent debris. Polymen Ag Right foot area significant wound between the first and second toes. We have been using silver alginate here Left lateral leg polymen Ag at the base of his original venous insufficiency wound Left buttock some improvement here 3/1; Right foot is deteriorating in the first second toe webspace. Larger and more substantial. We have been using silver alginate. Left dorsal foot about the same markedly adherent surface debris using PolyMem Ag Left lateral calf surface debris using PolyMem AG Left buttock is improved again using PolyMem Ag. He is completing his terbinafine. The erythema in the foot seems better. He has been on this for 2 weeks 3/8; no improvement in any wound area in fact he has [name] small open area on the dorsal midfoot which is new this week. He has not gotten his foot x-rays yet 3/15; his x-rays were both negative for osteomyelitis of both feet. No major change in any of his wounds on the  extremities however his buttock wounds are better. We have been using polymen on the buttocks, left lower leg. Iodoflex on the left foot and silver alginate on the right 3/22; arrives in clinic today with the 2 major issues are the improvement in the left dorsal foot wound which for once actually looks healthy with [name] nice healthy wound surface without debridement. Using Iodoflex here. Unfortunately on the left lateral calf which is in the distal part of his original wound he came to the clinic here for there was purulent drainage noted some increased breakdown scattered around the original area and [name] small area proximally. We we are using polymen here will change to silver alginate today. His buttock wound on the left is better and I think the area on the right first second toe webspace is also improved 3/29; left dorsal foot looks better. Using Iodoflex. Left ankle culture from deterioration last time grew E. coli, Enterobacter and Enterococcus. I will give him [name] course of cefdinir although that will not cover Enterococcus. The area on the right foot in the webspace of the first and second toe lateral first toe looks better. The area on his buttock is about healed Vascular appointment is on April 21. This is to look at his venous system vis--vis continued breakdown of the wounds on the left including the left lateral leg and left dorsal foot he. He has had previous ablations on this side 4/5; the area between the right first and second toes lateral aspect of the first toe looks better. Dorsal aspect of the left first toe on the left foot also improved. Unfortunately the left lateral lower leg is larger and there is [name] second satellite wound superiorly. The usual superficial abrasions on the left buttock overall better but certainly not closed 4/12; the area between the right first and second toes is improved. Dorsal aspect of the left foot also slightly smaller with [name] vibrant healthy looking  surface. No real change in the left lateral leg and the left buttock wound is healed He has an unaffordable co-pay for Apligraf. Appointment with vein and vascular with regards to the left leg venous part of the circulation is on 4/21 4/19; we continue to see improvement in all wound areas. Although this  is minor. He has his vascular appointment on 4/21. The area on the left buttock has not reopened although right in the center of this area the skin looks somewhat threatened 4/26; the left buttock is unfortunately reopened. In general his left dorsal foot has [name] healthy surface and looks somewhat smaller although it was not measured as such. The area between his first and second toe webspace on the right as [name] small wound against the first toe. The patient saw vascular surgery. The real question I was asking was about the small saphenous vein on the left. He has previously ablated left greater saphenous vein. Nothing further was commented on on the left. Right greater saphenous vein without reflux at the saphenofemoral junction or proximal thigh there was no indication for ablation of the right greater saphenous vein duplex was negative for DVT bilaterally. They did not think there was anything from [name] point of view that could be offered. They ABIs within normal limits 5/3; only small open area on the left buttock. The area on the left lateral leg which was his original venous reflux is now 2 wounds both which look clean. We are using Iodoflex on the left dorsal foot which looks healthy and smaller. He is down to [name] very tiny area between the right first and second toes, using silver alginate 5/10; all of his wounds appear better. We have much better edema control in 4 layer compression on the left. This may be the factor that is allowing the left foot and left lateral calf to heal. He has external compression garments at home 04/14/20-All of his wounds are progressing well, the left  forefoot is practically closed, left ischium appears to be about the same, right toe webspace is also smaller. The left lateral leg is about the same, continue using Hydrofera Blue to this, silver alginate to the ischium, Iodoflex to the toe space on the right 6/7; most of his wounds outside of the left buttock are doing well. The area on the left lateral calf and left dorsal foot are smaller. The area on the right foot in between the first and second toe webspace is barely visible although he still says there is some drainage here is the only reason I did not heal this out. Unfortunately the area on the left buttock almost looks like he has [name] skin tear from tape. He has open wound and then [name] large flap of skin that we are trying to get adherence over an area just next to the remaining wound 6/21; 2 week follow-up. I believe is been here for nurse visits. Miraculously the area between his first and second toes on the left dorsal foot is closed over. Still open on the right first second web space. The left lateral calf has 2 open areas. Distally this is more superficial. The proximal area had [name] little more depth and required debridement of adherent necrotic material. His buttock wound is actually larger we have been using silver alginate here 6/28; the patient's area on the left foot remains closed. Still open wet area between the first and second toes on the right and also extending into the plantar aspect. We have been using silver alginate in this location. He has 2 areas on the left lower leg part of his original long wounds which I think are better. We have been using Hydrofera Blue here. Hydrofera Blue to the left buttock which is stable 7/12; left foot remains closed. Left ankle is closed. May  be [name] small area between his right first and second toes the only truly open area is on the left buttock. We have been using Hydrofera Blue here 7/19; patient arrives with marked deterioration especially in  the left foot and ankle. We did not put him in [name] compression wrap on the left last week in fact he wore his juxta lite stockings on either side although he does not have an underlying stocking. He has [name] reopening on the left dorsal foot, left lateral ankle and [name] new area on the right dorsal ankle. More worrisome is the degree of erythema on the left foot extending on the lateral foot into the lateral lower leg on the left 7/26; the patient had erythema and drainage from the lateral left ankle last week. Culture of this grew MRSA resistant to doxycycline and clindamycin which are the 2 antibiotics we usually use with this patient who has multiple antibiotic allergies including linezolid, trimethoprim sulfamethoxazole. I had give him an empiric doxycycline and he comes in the area certainly looks somewhat better although it is blotchy in his lower leg. He has not been systemically unwell. He has had areas on the left dorsal foot which is [name] reopening, chronic wounds on the left lateral ankle. Both of these I think are secondary to chronic venous insufficiency. The area between his first and second toes is closed as far as I can tell. He had [name] new wrap injury on the right dorsal ankle last week. Finally he has an area on the left buttock. We have been using silver alginate to everything except the left buttock we are using Hydrofera Blue 06/30/20-Patient returns at 1 week, has been given [name] sample dose pack of [id number] which is [name] tetracycline derivative [omadacycline], patient has completed those, we have been using silver alginate to almost all the wounds except the left ischium where we are using Hydrofera Blue all of them look better 8/16; since I last saw the patient he has been doing well. The area on the left buttock, left lateral ankle and left foot are all closed today. He has completed the [place] I gave him last time and tolerated this well. He still has open areas on the right dorsal ankle and in the  right first second toe area which we are using silver alginate. 8/23; we put him in his bilateral external compression stockings last week as he did not have anything open on either leg except for concerning area between the right first and second toe. He comes in today with an area on the left dorsal foot slightly more proximal than the original wound, the left lateral foot but this is actually [name] continuation of the area he had on the left lateral ankle from last time. As well he is opened up on the left buttock again. 8/30; comes in today with things looking [name] lot better. The area on the left lower ankle has closed down as has the left foot but with eschar in both areas. The area on the dorsal right ankle is also epithelialized. Very little remaining of the left buttock wound. We have been using silver alginate on all wound areas 9/13; the area in the first second toe webspace on the right has fully epithelialized. He still has some vulnerable epithelium on the right and the ankle and the dorsal foot. He notes weeping. He is using his juxta lite stocking. On the left again the left dorsal foot is closed left lateral ankle is closed. We  went to the juxta lite stocking here as well. Still vulnerable in the left buttock although only 2 small open areas remain here 9/27; 2-week follow-up. We did not look at his left leg but the patient says everything is closed. He is [name] bit disturbed by the amount of edema in his left foot he is using juxta lite stockings but asking about over the toes stockings which would be 30/40, will talk to him next time. According to him there is no open wound on either the left foot or the left ankle/calf He has an open area on the dorsal right calf which I initially point [name] wrap injury. He has superficial remaining wound on the left ischial tuberosity been using silver alginate although he says this sticks to the wound 10/5; we gave him 2-week follow-up but he called yesterday  expressing some concerns about his right foot right ankle and the left buttock. He came in early. There is still no open areas on the left leg and that still in his juxta lite stocking 10/11; he only has 1 small area on the left buttock that remains measuring millimeters 1 mm. Still has the same irritated skin in this area. We recommended zinc oxide when this eventually closes and pressure relief is meticulously is he can do this. He still has an area on the dorsal part of his right first through third toes which is [name] bit irritated and still open and on the dorsal ankle near the crease of the ankle. We have been using silver alginate and using his own stocking. He has nothing open on the left leg or foot 10/25; 2-week follow-up. Not nearly as good on the left buttock as I was hoping. For open areas with 5 looking threatened small. He has the erythematous irritated chronic skin in this area. 1 area on the right dorsal ankle. He reports this area bleeds easily Right dorsal foot just proximal to the base of his toes We have been using silver alginate. 11/8; 2-week follow-up. Left buttock is about the same although I do not think the wounds are in the same location we have been using silver alginate. I have asked him to use zinc oxide on the skin around the wounds. He still has [name] small area on the right dorsal ankle he reports this bleeds easily Right dorsal foot just proximal to the base of the toes does not have anything open although the skin is very dry and scaly He has [name] new opening on the nailbed of the left great toe. Nothing on the left ankle 11/29; 3-week follow-up. Left buttock has 2 open areas. And washing of these wounds today started bleeding easily. Suggesting very friable tissue. We have been using silver alginate. Right dorsal ankle which I thought was initially [name] wrap injury we have been using silver alginate. Nothing open between the toes that I can see. He states the area on the left  dorsal toe nailbed healed after the last visit in 2 or 3 days 12/13; 3-week follow-up. His left buttock now has 3 open areas but the original 2 areas are smaller using polymen here. Surrounding skin looks better. The right dorsal ankle is closed. He has [name] small opening on the right dorsal foot at the level of the third toe. In general the skin looks better here. He is wearing his juxta lite stocking on the left leg says there is nothing open 11/24/2020; 3 weeks follow-up. His left buttock still has the 3 open  areas. We have been using polymen but due to lack of response he changed to [facility] area. Surrounding skin is dry erythematous and irritated looking. There is no evidence of infection either bacterial or fungal however there is loss of surface epithelium He still has very dry skin in his foot causing irritation and erythema on the dorsal part of his toes. This is not responded to prolonged courses of antifungal simply looks dry and irritated 1/24; left buttock area still looks about the same he was unable to find the triad ointment that we had suggested. The area on the right lower leg just above the dorsal ankle has reopened and the areas on the right foot between the first second and second third toes and scaling on the bottom of the foot has been about the same for quite some time now. been using silver alginate to all wound areas 2/7; left buttock wound looked quite good although not much smaller in terms of surface area surrounding skin looks better. Only [name] few dry flaking areas on the right foot in between the first and second toes the skin generally looks better here [ammonium lactate]. Finally the area on the right dorsal ankle is closed 2/21; There is no open area on the right foot even between the right first and second toe. Skin around this area dorsally and plantar aspects look better. He has [name] reopening of the area on the right ankle just above the crease of the ankle dorsally.  I continue to think that this is probably friction from spasms may be even this time with his stocking under the compression stockings. Wounds on his left buttock look about the same there [name] couple of areas that have reopened. He has [name] total square area of loss of epithelialization. This does not look like infection it looks like [name] contact dermatitis but I just cannot determine to what 3/14; there is nothing on the right foot between the first and second toes this was carefully inspected under illumination. Some chronic irritation on the dorsal part of his foot from toes 1-3 at the base. Nothing really open here substantially. Still has an area on the right foot/ankle that is actually larger and hyper granulated. His buttock area on the left is just about closed however he has chronic inflammation with loss of the surface epithelial layer 3/28; 2-week follow-up. In clinic today with [name] new wound on the left anterior mid tibia. Says this happened about 2 weeks ago. He is not really sure how wonders about the spasticity of his legs at night whether that could have caused this other than that he does not have [name] good idea. He has been using topical antibiotics and silver alginate. The area on his right dorsal ankle seems somewhat better. Finally everything on his left buttock is closed. 4/11; 2-week follow-up. All of his wounds are better except for the area over the ischium and left buttock which have opened up widely again. At least part of this is covered in necrotic fibrinous material another part had rolled nonviable skin. The area on the right ankle, left anterior mid tibia are both [name] lot better. He had no open wounds on either foot including the areas between the first and second toes 4/25; patient presents for 2-week follow-up. He states that the wounds are overall stable. He has no complaints today and states he is using Hydrofera Blue to open wounds. 5/9; have not seen this man in over [name] month.  For my  memory he has open areas on the left mid tibia and right ankle. T oday he has new open area on the right dorsal foot which we have not had [name] problem with recently. He has the sustained area on the left buttock He is also changed his insurance at the beginning of the year [employer]. We will need prior authorizations for debridement 5/23; patient presents for 2-week follow-up. He has prior authorizations for debridement. He denies any issues in the past 2 weeks with his wound care. He has been using Hydrofera Blue to all the wounds. He does report [name] circular rash to the upper left leg that is new. He denies acute signs of infection. 6/6; 2-week follow-up. The patient has open wounds on the left buttock which are worse than the last time I saw this about [name] month ago. He also has [name] new area to me on the left anterior mid tibia with some surrounding erythema. The area on the dorsal ankle on the right is closed but I think this will be [name] friction injury every time this area is exposed to either our wraps or his compression stockings caused by unrelenting spasms in this leg. 6/20; 2-week follow-up. The patient has open wounds on the left buttock which is about the same. Using [facility] here. - The left mid tibia has [name] static amount of surrounding erythema. Also [name] raised area in the center. We have been using Hydrofera Blue here. Finally he has broken down in his dorsal right foot extending between the first and second toes and going to the base of the first and second toe webspace. I have previously assumed that this was severe venous hypertension 7/5; 2-week follow-up The left buttock wound actually looks better. We are using Hydrofera Blue. He has extensive skin irritation around this area and I have not really been able to get that any better. I have tried Lotrisone i.e. antifungals and steroids. More most recently we have just been using Coloplast really looks about the same. The left  mid tibia which was new last week culture to have very resistant staph aureus. Not only methicillin-resistant but doxycycline resistant. The patient has [name] plethora of antibiotic allergies including sulfa, linezolid. I used topical bacitracin on this but he has not started this yet. In addition he has an expanding area of erythema with [name] wound on the dorsal right foot. I did [name] deep tissue culture of this area today 7/12; Left buttock area actually looks better surrounding skin also looks less irritated. Left mid tibia looks about the same. He is using bacitracin this is not worse Right dorsal foot looks about the same as well. The left first toe also looks about the same 7/19; left buttock wound continues to improve in terms of open areas Left mid tibia is still concerning amount of swelling he is using bacitracin Dorsal left first toe somewhat smaller Right dorsal foot somewhat smaller 7/25; left buttock wound actually continues to improve Left mid tibia area has less swelling. I gave him all my samples of new Nuzyra. This seems to have helped although the wound is still open it. His abrasion closed by here Left dorsal great toe really no better. Still [name] very nonviable surface Right dorsal foot perhaps some better. We have been using bacitracin and silver nitrate to the areas on his lower legs and Hydrofera Blue to the area on the buttock. 8/16 Disappointed that his left buttock wound is actually more substantial. Apparently during the  last nurse visit these were both very small. He has continued irritation to [name] large area of skin on his buttock. I have never been able to totally explain this although I think it some combination of the way he sits, pressure, moisture. He is not incontinent enough to contribute to this. Left dorsal great toe still fibrinous debris on the surface that I have debrided today Large area across the dorsal right toes. The area on the left anterior mid tibia has less  swelling. He completed the [place]. This does not look infected although the tissue is still fried 8/30; 2-week follow-up. Left buttock areas not improved. We used Hydrofera Blue on this. Weeping wet with the surrounding erythema that I have not been able to control even with Lotrisone and topical Coloplast Left dorsal great toe looks about the same More substantial area again at the base of his toes on the left which is new this week. Area across the dorsal right toes looks improved The left anterior mid tibia looks like it is trying to close 9/13; 2-week follow-up. Using silver alginate on all of his wounds. The left dorsal foot does not look any better. He has the area on the dorsal toe and also the areas at the base of all of his toes 1 through 3. On the right foot he has [name] similar pattern in [name] similar area. He has the area on his left mid tibia that looks fairly healthy. Finally the area on his left buttock looks somewhat bette 9/20; culture I did of the left foot which was [name] deep tissue culture last time showed E. coli he has erythema around this wound. Still [name] completely necrotic surface. His right dorsal foot looks about the same. He has [name] very friable surface to the left anterior mid tibia. Both buttock wounds look better. We have been using silver alginate to all wounds 10/4; he has completed the cephalexin that I gave him last time for the left foot. He is using topical gentamicin under silver alginate silver alginate being applied to all the wounds. Unfortunately all the wounds look irritated on his dorsal right foot dorsal left foot left mid tibia. I wonder if this could be [name] silver allergy. I am going to change him to [facility] on the lower extremity. The skin on the left buttock and left posterior thigh still flaking dry and irritated. This has continued no matter what I have applied topically to this. He has [name] solitary open wound which by itself does not look too bad however the  entire area of surrounding skin does not change no matter what we have applied here 10/18; the area on the left dorsal foot and right dorsal foot both look better. The area on the right extends into the plantar but not between his toes. We have been using silver alginate. He still has [name] rectangular erythematous area around the area on the left tibia. The wound itself is very small. Finally everything on his left buttock looks [name] little larger the skin is erythematous 11/15; patient comes in with the left dorsal and right dorsal foot distally looking somewhat better. Still nonviable surface on the left foot which required debridement. He still has the area on the left anterior mid tibia although this looks somewhat better. He has [name] new area on the right lateral lower leg just above the ankle. Finally his left buttock looks terrible with multiple superficial open wounds geometric square shaped area of chronic erythema which I  have not been able to sort through 11/29; right dorsal foot and left dorsal foot both look somewhat better. No debridement required. He has the fragile area on the left anterior mid tibia this looks and continues to look somewhat better. Right lateral lower leg just above the ankle we identified last time also looks better. In general the area on his left buttock looks improved. We are using Hydrofera Blue to all wound areas 12/13; right dorsal foot looks better. The area on the right lateral leg is healed. Left dorsal foot has 2 open areas both of which require debridement. The fragile area on the left anterior mid tibial looks better. Smaller area on his buttocks. Were using Hydrofera Blue 1/10; patient comes in with everything looking slightly larger and/or worse. This includes his left buttock, reopening of the left mid tibia, larger areas on the left dorsal foot and what looks to be [name] cellulitis on the right dorsal and plantar foot. We have been using Hydrofera Blue on all  wounds. 1/17; right dorsal foot distally looks better today. The left foot has 2 open wounds that are about the same surrounding erythema. 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 No malignancy they did not stain for fungus 1/24; his right distal foot is not closed dry and scaly but the wound looks like it is contracting. I did not debride anything here. Problem on his left dorsal foot with expanding erythema. Apparently there were problems last week getting the Elesa Hacker however it is now available at the [facility] but [name] week later. He is using ketoconazole and Coloplast to the left buttock along with Hydrofera Blue this actually looks quite [name] bit better today. 1/31; right dorsal foot again is dry and scaly but looking to contract. He has been using [name] moisturizer on his feet at my request but he is not sure which 1. The left dorsal foot wounds look about the same there is erythema here that I marked last week however after course of Nuzyra it certainly is not any better but not any worse either. Finally on his left buttock the skin continues to look better he has the original wound but [name] new substantial area towards the gluteal cleft. Almost like [name] skin tear. I used scissors to remove skin and subcutaneous tissue here silver nitrate and direct pressure 2/7; right dorsal foot. This does not look too much different from last week. Some erythema skin dry and scaly. No debridement. Left dorsal great toe again still not much improvement. I did remove flaking dry skin and callus from around the edge. Finally on his left buttock. The skin is somewhat better in the periwound. Surface wounds are superficial somewhat better than last week. 01/26/2022: Is [name] little bit of [name] mystery as to why his wounds fail to respond to treatments and actually seem to get worse. This is  my first encounter with this patient. He was previously followed by Dr. Dellia Nims. Based upon my review of the chart, it seems that there is [name] little bit of [name] mystery as to why his wounds do not respond as anticipated to the interventions applied and sometimes even get worse. Biopsies have been performed and he was seen by dermatology in [place], but that did not shed any light on the matter. T oday, his gluteal wound is larger, with substantial drainage, rather malodorous. The food wounds are  not terrible, but he has [name] lot of callus and scaly skin around these. He is currently getting silver alginate on the gluteal wound, with idodoflex to the feet. He is using lotrisone on his legs for the dry, scaly skin. 02/09/2022: There has really been no change to any of his wounds. The gluteal wound less drainage and odor, but remains about the same size, the periwound skin remains oddly scaly. His lower extremity wounds also appear roughly the same size. They continue to accumulate [name] small amount of slough. The periwound on his feet and ankle wounds has dry eschar and loose dead skin. We have been using silver alginate on the gluteal wound and Iodoflex on his feet and ankle wounds. T the periwound around his gluteal lesion and Lotrisone on his feet and legs. o 02/23/2022: The right plantar foot wound is closed. The gluteal site looks small but has continued to produce hypertrophic granulation tissue. The foot wounds all look about the same on the dorsal surface of the right foot; on the left, there is only [name] small open area at the site of where his left great toenail would have been. 03/16/2022: The right ankle wound is healed. The right dorsal foot wound is about the same. The left dorsal foot wound is quite [name] bit smaller and the ischial wound is nearly closed. 03/30/2022: The right ankle wound reopened. Both dorsal foot wounds are quite [name] bit smaller. Unfortunately, he appears to have sheared part of his ischial  wound open further, perhaps during [name] transfer. 04/13/2022: The right ankle wound has hypertrophic granulation tissue present. The dorsal foot wounds continue to decrease in size. The ischial wound looks about the same today, no better, no worse. 04/27/2022: The right ankle wound has closed. Unfortunately, it looks like some moisture got underneath the dry skin on both of his dorsal feet and these wounds have expanded in size. The ischial wound remains the same with perhaps [name] little bit more slough accumulation than at our previous visit. 05/11/2022: The right ankle wound remains closed. There is [name] left anterior tibial wound that is small has patchy openings with accumulated slough. The dorsum of his right foot appears to be nearly healed with just [name] small punctate opening. The plantar surface of his right foot has [name] new opening that looks like he may have picked some skin there. His sacral ulcer has hypertrophic granulation tissue but has some slough accumulation. The dorsum of his left foot has multiple open areas in [name] fairly ragged distribution. All of these have slough accumulated within them. 06/01/2022: The right ankle and left anterior tibial wound are both closed. Dorsum of his right foot and left foot both look substantially better with just tiny scattered openings The without any slough accumulation. He has sheared open new areas on his left gluteus and ischium. He says that his wheelchair cushion, which is air-filled, has [name] leak and so it keeps deflating. He is awaiting [name] new cushion. 06/15/2022: The right ankle wound has reopened and the fat layer is exposed. Both dorsal feet have just small openings with just [name] little bit of slough and eschar accumulation. The wound on his left gluteus and ischium is larger again today and has [name] foul odor. Electronic Signature(s) Signed: 06/15/2022 10:03:18 AM By: Fredirick Maudlin MD FACS Entered By: Fredirick Maudlin on 06/15/2022  10:03:18 -------------------------------------------------------------------------------- Physical Exam Details Patient Name: Date of Service: [name] [name] [name] 06/15/2022 8:15 [name] [name] Medical Record Number: [id number] Patient Account Number:  [id number] Date of Birth/Sex: Treating RN: 04-17-1986 (36 y.o. [name] [name] Primary Care Provider: [place], [place] Other Clinician: Referring Provider: Treating Provider/Extender: Graciela Husbands, [name]: 336 Constitutional . . . . No acute distress.Marland Kitchen Respiratory Normal work of breathing on room air.. Notes 06/15/2022: The right ankle wound has reopened and the fat layer is exposed. Both dorsal feet have just small openings with just [name] little bit of slough and eschar accumulation. The wound on his left gluteus and ischium is larger again today and has [name] foul odor. Electronic Signature(s) Signed: 06/15/2022 10:03:47 AM By: Fredirick Maudlin MD FACS Entered By: Fredirick Maudlin on 06/15/2022 10:03:47 -------------------------------------------------------------------------------- Physician Orders Details Patient Name: Date of Service: [name] [name] [name] 06/15/2022 8:15 [name] [name] Medical Record Number: [id number] Patient Account Number: [id number] Date of Birth/Sex: Treating RN: 02/12/1986 (36 y.o. 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 - Dr Celine Ahr - Room 1 Return [name] Tuesday 06/29/2022 @ 8:15pm Bathing/ Shower/ Hygiene May shower and wash wound with soap and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 week. We have been using Prisma, I thought I have disclosed this week, we can only look forward to next week 01/03/17; the patient had concerning areas of erythema last week, already on doxycycline for UTI through his primary doctor. The erythema is absolutely no better there is warmth and swelling both medially from the left lateral leg wound and also the dorsal left foot. 01/06/17- Patient is here for follow-up evaluation of his left lateral leg ulcer and bilateral feet ulcers. He is on oral antibiotic therapy,  tolerating that. Nursing staff and the patient states that the erythema is improved from Monday. 01/13/17; the predominant left lateral leg wound continues to be problematic. I had put Apligraf on him earlier this month once. However he subsequently developed what appeared to be an intense cellulitis around the left lateral leg wound. I gave him Dalvance I think on 2/12 perhaps 2/13 he continues on cefdinir. The erythema is still present but the warmth and swelling is improved. I am hopeful that the cellulitis part of this control. I wouldn't be surprised if there is an element of venous inflammation  as well. 01/17/17. The erythema is present but better in the left leg. His left lateral leg wound still does not have [name] viable surface buttons certain parts of this long thin wound it appears like there has been improvement in dimensions. 01/20/17; the erythema still present but much better in the left leg. I'[name] thinking this is his usual degree of chronic venous inflammation. The wound on the left leg looks somewhat better. Is less surface slough 01/27/17; erythema is back to the chronic venous inflammation. The wound on the left leg is somewhat better. I am back to the point where I like to try an Apligraf once again 02/10/17; slight improvement in wound dimensions. Apligraf #2. He is completing his doxycycline 02/14/17; patient arrives today having completed doxycycline last Thursday. This was supposed to be [name] nurse visit however once again he hasn't tense erythema from the medial part of his wound extending over the lower leg. Also erythema in his foot this is roughly in the same distribution as last time. He has baseline chronic venous inflammation however this is [name] lot worse than the baseline I have learned to accept the on him is baseline inflammation 02/24/17- patient is here for follow-up evaluation. He is tolerating compression therapy. His voicing no complaints or concerns he is here anticipating an Apligraf 03/03/17; he arrives today with an adherent necrotic surface. I don't think this is surface is going to be amenable for Apligraf's. The erythema around his wound and on the left dorsal foot has resolved he is off antibiotics 03/10/17; better-looking surface today. I don't think he can tolerate Apligraf's. He tells me he had [name] wound VAC after [name] skin graft years ago to this area and they had difficulty with [name] seal. The erythema continues to be stable around this some degree of chronic venous inflammation but he also has recurrent cellulitis. We have been using Iodoflex 03/17/17; continued  improvement in the surface and may be small changes in dimensions. Using Iodoflex which seems the only thing that will control his surface 03/24/17- He is here for follow up evaluation of his LLE lateral ulceration and ulcer to right dorsal foot/toe space. He is voicing no complaints or concerns, He is tolerating compression wrap. 03/31/17 arrives today with [name] much healthier looking wound on the left lower extremity. We have been using Iodoflex for [name] prolonged period of time which has for the first time prepared and adequate looking wound bed although we have not had much in the way of wound dimension improvement. He also has [name] small wound between the first and second toe on the right 04/07/17; arrives today with [name] healthy-looking wound bed and at least the top 50% of this wound appears to be now her. No debridement was required I have changed him to [facility] last week after prolonged Iodoflex. He did not do well with Apligraf's. We've had [name] re-opening between the  first and second toe on the right 04/14/17; arrives today with [name] healthier looking wound bed contractions and the top 50% of this wound and some on the lesser 50%. Wound bed appears healthy. The area between the first and second toe on the right still remains problematic 04/21/17; continued very gradual improvement. Using [facility] 04/28/17; continued very gradual improvement in the left lateral leg venous insufficiency wound. His periwound erythema is very mild. We have been using Hydrofera Blue. Wound is making progress especially in the superior 50% 05/05/17; he continues to have very gradual improvement in the left lateral venous insufficiency wound. Both in terms with an length rings are improving. I debrided this every 2 weeks with #5 curet and we have been using Hydrofera Blue and again making good progress With regards to the wounds between his right first and second toe which I thought might of been tinea pedis he is not making  as much progress very dry scaly skin over the area. Also the area at the base of the left first and second toe in [name] similar condition 05/12/17; continued gradual improvement in the refractory left lateral venous insufficiency wound on the left. Dimension smaller. Surface still requiring debridement using Hydrofera Blue 05/19/17; continued gradual improvement in the refractory left lateral venous ulceration. Careful inspection of the wound bed underlying rumination suggested some degree of epithelialization over the surface no debridement indicated. Continue Hydrofera Blue difficult areas between his toes first and third on the left than first and second on the right. I'[name] going to change to silver alginate from silver collagen. Continue ketoconazole as I suspect underlying tinea pedis 05/26/17; left lateral leg venous insufficiency wound. We've been using Hydrofera Blue. I believe that there is expanding epithelialization over the surface of the wound albeit not coming from the wound circumference. This is [name] bit of an odd situation in which the epithelialization seems to be coming from the surface of the wound rather than in the exact circumference. There is still small open areas mostly along the lateral margin of the wound. ooHe has unchanged areas between the left first and second and the right first second toes which I been treating for tenia pedis 06/02/17; left lateral leg venous insufficiency wound. We have been using Hydrofera Blue. Somewhat smaller from the wound circumference. The surface of the wound remains [name] bit on it almost epithelialized sedation in appearance. I use an open curette today debridement in the surface of all of this especially the edges ooSmall open wounds remaining on the dorsal right first and second toe interspace and the plantar left first second toe and her face on the left 06/09/17; wound on the left lateral leg continues to be smaller but very gradual and very dry  surface using Hydrofera Blue 06/16/17 requires weekly debridements now on the left lateral leg although this continues to contract. I changed to silver collagen last week because of dryness of the wound bed. Using Iodoflex to the areas on his first and second toes/web space bilaterally 06/24/17; patient with history of paraplegia also chronic venous insufficiency with lymphedema. Has [name] very difficult wound on the left lateral leg. This has been gradually reducing in terms of with but comes in with [name] very dry adherent surface. High switch to silver collagen [name] week or so ago with hydrogel to keep the area moist. This is been refractory to multiple dressing attempts. He also has areas in his first and second toes bilaterally in the anterior and posterior web  space. I had been using Iodoflex here after [name] prolonged course of silver alginate with ketoconazole was ineffective [question tinea pedis] 07/14/17; patient arrives today with [name] very difficult adherent material over his left lateral lower leg wound. He also has surrounding erythema and poorly controlled edema. He was switched his Santyl last visit which the nurses are applying once during his doctor visit and once on [name] nurse visit. He was also reduced to 2 layer compression I'[name] not exactly sure of the issue here. 07/21/17; better surface today after 1 week of Iodoflex. Significant cellulitis that we treated last week also better. [Doxycycline] 07/28/17 better surface today with now 2 weeks of Iodoflex. Significant cellulitis treated with doxycycline. He has now completed the doxycycline and he is back to his usual degree of chronic venous inflammation/stasis dermatitis. He reminds me he has had ablations surgery here 08/04/17; continued improvement with Iodoflex to the left lateral leg wound in terms of the surface of the wound although the dimensions are better. He is not currently on any antibiotics, he has the usual degree of chronic venous  inflammation/stasis dermatitis. Problematic areas on the plantar aspect of the first second toe web space on the left and the dorsal aspect of the first second toe web space on the right. At one point I felt these were probably related to chronic fungal infections in treated him aggressively for this although we have not made any improvement here. 08/11/17; left lateral leg. Surface continues to improve with the Iodoflex although we are not seeing much improvement in overall wound dimensions. Areas on his plantar left foot and right foot show no improvement. In fact the right foot looks somewhat worse 08/18/17; left lateral leg. We changed to [name] Blue last week after [name] prolonged course of Iodoflex which helps get the surface better. It appears that the wound with is improved. Continue with difficult areas on the left dorsal first second and plantar first second on the right 09/01/17; patient arrives in clinic today having had [name] temperature of 103 yesterday. He was seen in the ER and [facility]. The patient was concerned he could have cellulitis again in the right leg however they diagnosed him with [name] UTI and he is now on Keflex. He has [name] history of cellulitis which is been recurrent and difficult but this is been in the left leg, in the past 5 use doxycycline. He does in and out catheterizations at home which are risk factors for UTI 09/08/17; patient will be completing his Keflex this weekend. The erythema on the left leg is considerably better. He has [name] new wound today on the medial part of the right leg small superficial almost looks like [name] skin tear. He has worsening of the area on the right dorsal first and second toe. His major area on the left lateral leg is better. Using Hydrofera Blue on all areas 09/15/17; gradual reduction in width on the long wound in the left lateral leg. No debridement required. He also has wounds on the plantar aspect of his left first second toe web space and on the  dorsal aspect of the right first second toe web space. 09/22/17; there continues to be very gradual improvements in the dimensions of the left lateral leg wound. He hasn't round erythematous spot with might be pressure on his wheelchair. There is no evidence obviously of infection no purulence no warmth ooHe has [name] dry scaled area on the plantar aspect of the left first second toe ooImproved area on  the dorsal right first second toe. 09/29/17; left lateral leg wound continues to improve in dimensions mostly with an is still [name] fairly long but increasingly narrow wound. ooHe has [name] dry scaled area on the plantar aspect of his left first second toe web space ooIncreasingly concerning area on the dorsal right first second toe. In fact I am concerned today about possible cellulitis around this wound. The areas extending up his second toe and although there is deformities here almost appears to abut on the nailbed. 10/06/17; left lateral leg wound continues to make very gradual progress. Tissue culture I did from the right first second toe dorsal foot last time grew MRSA and enterococcus which was vancomycin sensitive. This was not sensitive to clindamycin or doxycycline. He is allergic to Zyvox and sulfa we have therefore arrange for him to have dalvance infusion tomorrow. He is had this in the past and tolerated it well 10/20/17; left lateral leg wound continues to make decent progress. This is certainly reduced in terms of with there is advancing epithelialization.ooThe cellulitis in the right foot looks better although he still has [name] deep wound in the dorsal aspect of the first second toe web space. Plantar left first toe web space on the left I think is making some progress 10/27/17; left lateral leg wound continues to make decent progress. Advancing epithelialization.using Hydrofera Blue ooThe right first second toe web space wound is better-looking using silver alginate ooImprovement in the left  plantar first second toe web space. Again using silver alginate 11/03/17 left lateral leg wound continues to make decent progress albeit slowly. Using Hydrofera Blue ooThe right per second toe web space continues to be [name] very problematic looking punched out wound. I obtained [name] piece of tissue for deep culture I did extensively treated this for fungus. It is difficult to imagine that this is [name] pressure area as the patient states other than going outside he doesn't really wear shoes at home ooThe left plantar first second toe web space looked fairly senescent. Necrotic edges. This required debridement oochange to Hydrofera Blue to all wound areas 11/10/17; left lateral leg wound continues to contract. Using Hydrofera Blue ooOn the right dorsal first second toe web space dorsally. Culture I did of this area last week grew MRSA there is not an easy oral option in this patient was multiple antibiotic allergies or intolerances. This was only [name] rare culture isolate I'[name] therefore going to use Bactroban under silver alginate ooOn the left plantar first second toe web space. Debridement is required here. This is also unchanged 11/17/17; left lateral leg wound continues to contract using Hydrofera Blue this is no longer the major issue. ooThe major concern here is the right first second toe web space. He now has an open area going from dorsally to the plantar aspect. There is now wound on the inner lateral part of the first toe. Not [name] very viable surface on this. There is erythema spreading medially into the forefoot. ooNo major change in the left first second toe plantar wound 11/24/17; left lateral leg wound continues to contract using Hydrofera Blue. Nice improvement today ooThe right first second toe web space all of this looks [name] lot less angry than last week. I have given him clindamycin and topical Bactroban for MRSA and terbinafine for the possibility of underlining tinea pedis that I could not  control with ketoconazole. Looks somewhat better ooThe area on the plantar left first second toe web space is weeping with dried debris  around the wound 12/01/17; left lateral leg wound continues to contract he Hydrofera Blue. It is becoming thinner in terms of with nevertheless it is making good improvement. ooThe right first second toe web space looks less angry but still [name] large necrotic-looking wounds starting on the plantar aspect of the right foot extending between the toes and now extensively on the base of the right second toe. I gave him clindamycin and topical Bactroban for MRSA anterior benefiting for the possibility of underlying tinea pedis. Not looking better today ooThe area on the left first/second toe looks better. Debrided of necrotic debris 12/05/17* the patient was worked in urgently today because over the weekend he found blood on his incontinence bad when he woke up. He was found to have an ulcer by his wife who does most of his wound care. He came in today for [place] to look at this. He has not had [name] history of wounds in his buttocks in spite of his paraplegia. 12/08/17; seen in follow-up today at his usual appointment. He was seen earlier this week and found to have [name] new wound on his buttock. We also follow him for wounds on the left lateral leg, left first second toe web space and right first second toe web space 12/15/17; we have been using Hydrofera Blue to the left lateral leg which has improved. The right first second toe web space has also improved. Left first second toe web space plantar aspect looks stable. The left buttock has worsened using Santyl. Apparently the buttock has drainage 12/22/17; we have been using Hydrofera Blue to the left lateral leg which continues to improve now 2 small wounds separated by normal skin. He tells [place] he had [name] fever up to 100 yesterday he is prone to UTIs but has not noted anything different. He does in and out catheterizations. The area  between the first and second toes today does not look good necrotic surface covered with what looks to be purulent drainage and erythema extending into the third toe. I had gotten this to something that I thought look better last time however it is not look good today. He also has [name] necrotic surface over the buttock wound which is expanded. I thought there might be infection under here so I removed [name] lot of the surface with [name] #5 curet though nothing look like it really needed culturing. He is been using Santyl to this area 12/27/17; his original wound on the left lateral leg continues to improve using Hydrofera Blue. I gave him samples of Baxdella although he was unable to take them out of fear for an allergic reaction ["lump in his throat"].the culture I did of the purulent drainage from his second toe last week showed both enterococcus and [name] set Enterobacter I was also concerned about the erythema on the bottom of his foot although paradoxically although this looks somewhat better today. 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 We've been using silver alginate here. They came up today that he sleeps in [name] recliner, I'[name] not sure why but I asked him to stop this 01/03/18; his original wound we've been using Hydrofera Blue is now separated into 2 areas. ooUlcer on his left buttock is better he is off the recliner and sleeping in bed ooFinally both wound areas between his first and second toes also looks some better 01/10/18; his original wound on the left lateral leg  is now separated into 2 wounds we've been using Hydrofera Blue ooUlcer on his left buttock has some drainage. There is [name] small probing site going into muscle layer superiorly.using silver alginate -He arrives today with [name] deep tissue injury on the left heel ooThe wound on the dorsal aspect of his first second toe on the left looks [name] lot  betterusing silver alginate ketoconazole ooThe area on the first second toe web space on the right also looks [name] lot bette 01/17/18; his original wound on the left lateral leg continues to progress using Hydrofera Blue ooUlcer on his left buttock also is smaller surface healthier except for [name] small probing site going into the muscle layer superiorly. 2.4 cm of tunneling in this area ooDTI on his left heel we have only been offloading. Looks better than last week no threatened open no evidence of infection oothe wound on the dorsal aspect of the first second toe on the left continues to look like it's regressing we have only been using silver alginate and terbinafine orally ooThe area in the first second toe web space on the right also looks to be [name] lot better using silver alginate and terbinafine I think this was prompted by tinea pedis 01/31/18; the patient was hospitalized in [place] last week apparently for [name] complicated UTI. He was discharged on cefepime he does in and out catheterizations. In the hospital he was discovered [name] I don't mild elevation of AST and ALT and the terbinafine was stopped.predictably the pressure ulcer on s his buttock looks betterusing silver alginate. The area on the left lateral leg also is better using Hydrofera Blue. The area between the first and second toes on the left better. First and second toes on the right still substantial but better. Finally the DTI on the left heel has held together and looks like it's resolving 02/07/18-he is here in follow-up evaluation for multiple ulcerations. He has new injury to the lateral aspect of the last issue [name] pressure ulcer, he states this is from adhesive removal trauma. He states he has tried multiple adhesive products with no success. All other ulcers appear stable. The left heel DTI is resolving. We will continue with same treatment plan and follow-up next week. 02/14/18; follow-up for multiple areas. ooHe has [name] new area  last week on the lateral aspect of his pressure ulcer more over the posterior trochanter. The original pressure ulcer looks quite stable has healthy granulation. 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 Almost fully epithelialized on the original superior area using Hydrofera Blue ooDTI on the left heel has peeled off this week to reveal [name] small superficial wound under denuded skin and subcutaneous tissue ooBoth areas between the first and second toes look better including nothing open on the left 02/21/18; ooThe patient's wounds on his left ischial tuberosity and posterior left greater trochanter actually looked better. He has [name] large area of irritation around the area which I think is contact dermatitis. I am doubtful that this is fungal ooHis original wound on the left lateral calf continues to improve we have been using Hydrofera Blue ooThere is no open area in the left first second toe web space although there is [name] lot of thick callus ooThe DTI on the left heel required debridement today of necrotic surface eschar and subcutaneous tissue using silver alginate ooFinally the area on the right first second toe webspace continues to  contract using silver alginate and ketoconazole 02/28/18 ooLeft ischial tuberosity wounds look better using silver alginate. ooOriginal wound on the left calf only has one small open area left using Hydrofera Blue ooDTI on the left heel required debridement mostly removing skin from around this wound surface. Using silver alginate ooThe areas on the right first/second toe web space using silver alginate and ketoconazole 03/08/18 on evaluation today patient appears to be doing decently well as best I can tell in regard to his wounds. This is the first time that I have seen him as he generally is followed by Dr. Dellia Nims. With that being said none of his  wounds appear to be infected he does have an area where there is some skin covering what appears to be [name] new wound on the left dorsal surface of his great toe. This is right at the nail bed. With that being said I do believe that debrided away some of the excess skin can be of benefit in this regard. Otherwise he has been tolerating the dressing changes without complication. 03/14/18; patient arrives today with the multiplicity of wounds that we are following. He has not been systemically unwell ooOriginal wound on the left lateral calf now only has 2 small open areas we've been using Hydrofera Blue which should continue ooThe deep tissue injury on the left heel requires debridement today. We've been using silver alginate ooThe left first second toe and the right first second toe are both are reminiscence what I think was tinea pedis. Apparently some of the callus Surface between the toes was removed last week when it started draining. ooPurulent drainage coming from the wound on the ischial tuberosity on the left. 03/21/18-He is here in follow-up evaluation for multiple wounds. 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 He tolerated debridement. The only change to last week's recommendations is to discontinue antifungal cream between toes. He will follow-up next week 03/28/18; following up for multiple wounds;Concern this week is streaking redness and swelling in the right foot. He is going to need antibiotics for this. 03/31/18; follow-up for right foot cellulitis. Streaking redness and swelling in the right foot on 03/28/18. 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 I put him on clindamycin 300 mg every 6 and brought him in for [name] quick check. He has an open wound between his first and second toes on the right foot as [name] potential source. 04/04/18; ooRight foot cellulitis is resolving he is completing  clindamycin. This is truly good news ooLeft lateral calf wound which is initial wound only has one small open area inferiorly this is close to healing out. He has compression stockings. We will use Hydrofera Blue right down to the epithelialization of this ooNonviable surface on the left heel which was initially pressure with [name] DTI. We've been using Hydrofera Blue. I'[name] going to switch this back to silver alginate ooLeft first second toe/tinea pedis this looks better using silver alginate ooRight first second toe tinea pedis using silver alginate ooLarge pressure ulcers on theLeft ischial tuberosity. Small wound here Looks better. I am uncertain about the surface over the large wound. Using silver alginate 04/11/18; ooCellulitis in the right foot is resolved ooLeft lateral calf wound which was his original wounds still has 2 tiny open areas remaining this is just about closed ooNonviable surface on the left heel is better but still requires debridement ooLeft first second toe/tinea pedis still open using silver alginate   ooRight first second toe wound tinea pedis I asked him to go back to using ketoconazole and silver alginate ooLarge pressure ulcers on the left ischial tuberosity this shear injury here is resolved. Wound is smaller. No evidence of infection using silver alginate 04/18/18; ooPatient arrives with an intense area of cellulitis in the right mid lower calf extending into the right heel area. Bright red and warm. Smaller area on the left anterior leg. He has [name] significant history of MRSA. He will definitely need antibioticsoodoxycycline ooHe now has 2 open areas on the left ischial tuberosity the original large wound and now [name] satellite area which I think was above his initial satellite areas. Not [name] wonderful surface on this satellite area surrounding erythema which looks like pressure related. ooHis left lateral calf wound again his original wound is just about  closed ooLeft heel pressure injury still requiring debridement ooLeft first second toe looks [name] lot better using silver alginate ooRight first second toe also using silver alginate and ketoconazole cream also looks better 04/20/18; the patient was worked in early today out of concerns with his cellulitis on the right leg. I had started him on doxycycline. This was 2 days ago. His wife was concerned about the swelling in the area. Also concerned about the left buttock. He has not been systemically unwell no fever chills. No nausea vomiting or diarrhea 04/25/18; the patient's left buttock wound is continued to deteriorate he is using Hydrofera Blue. He is still completing clindamycin for the cellulitis on the right leg although all of this looks better. 05/02/18 ooLeft buttock wound still with [name] lot of drainage and [name] very tightly adherent fibrinous necrotic surface. He has [name] deeper area superiorly ooThe left lateral calf wound is still closed ooDTI wound on the left heel necrotic surface especially the circumference using Iodoflex ooAreas between his left first second toe and right first second toe both look better. Dorsally and the right first second toe he had [name] necrotic surface although at smaller. In using silver alginate and ketoconazole. I did [name] culture last week which was [name] deep tissue culture of the reminiscence of the open wound on the right first second toe dorsally. This grew [name] few Acinetobacter and [name] few methicillin-resistant staph aureus. Nevertheless the area actually this week looked better. I didn't feel the need to specifically address this at least in terms of systemic antibiotics. 05/09/18; wounds are measuring larger more drainage per our intake. We are using Santyl covered with alginate on the large superficial buttock wounds, Iodosorb on the left heel, ketoconazole and silver alginate to the dorsal first and second toes bilaterally. 05/16/18; ooThe area on his left buttock  better in some aspects although the area superiorly over the ischial tuberosity required an extensive debridement.using Santyl ooLeft heel appears stable. Using Iodoflex ooThe areas between his first and second toes are not bad however there is spreading erythema up the dorsal aspect of his left foot this looks like cellulitis again. He is insensate the erythema is really very brilliant.o Erysipelas He went to see an allergist days ago because he was itching part of this he had lab work done. This showed [name] white count of 15.1 with 70% neutrophils. Hemoglobin of 11.4 and [name] platelet count of 659,000. Last white count we had in [facility] was [name] 2-1/2 years ago which was 25.9 but he was ill at the time. He was able to show me some lab work that was done by his primary physician the pattern  is about the same. 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 But prompted me to go ahead and do x-rays of both feet and the pelvis rule out osteomyelitis. He also had [name] comprehensive metabolic panel this was reasonably normal his albumin was 3.7 liver function tests BUN/creatinine all normal 05/23/18; x-rays of both his feet from last week were negative for underlying pulmonary abnormality. The x-ray of his pelvis however showed mild irregularity in the left ischial which may represent some early osteomyelitis. The wound in the left ischial continues to get deeper clearly now exposed muscle. Each week necrotic surface material over this area. Whereas the rest of the wounds do not look so bad. ooThe left ischial wound we have been using Santyl and calcium alginate ooT the left heel surface necrotic debris using Iodoflex o ooThe left lateral leg is still healed ooAreas on the left dorsal foot and the right dorsal foot are about the same. There is some inflammation on the left which might represent contact dermatitis, fungal dermatitis I am doubtful cellulitis although this  looks better than last week 05/30/18; CT scan done at Hospital did not show any osteomyelitis or abscess. Suggested the possibility of underlying cellulitis although I don't see [name] lot of evidence of this at the bedside ooThe wound itself on the left buttock/upper thigh actually looks somewhat better. No debridement ooLeft heel also looks better no debridement continue Iodoflex ooBoth dorsal first second toe spaces appear better using Lotrisone. Left still required debridement 06/06/18; ooIntake reported some purulent looking drainage from the left gluteal wound. Using Santyl and calcium alginate ooLeft heel looks better although still [name] nonviable surface requiring debridement ooThe left dorsal foot first/second webspace actually expanding and somewhat deeper. I may consider doing [name] shave biopsy of this area ooRight dorsal foot first/second webspace appears stable to improved. Using Lotrisone and silver alginate to both these areas 06/13/18 ooLeft gluteal surface looks better. Now separated in the 2 wounds. No debridement required. Still drainage. We'll continue silver alginate ooLeft heel continues to look better with Iodoflex continue this for at least another week ooOf his dorsal foot wounds the area on the left still has some depth although it looks better than last week. We've been using Lotrisone and silver alginate 06/20/18 ooLeft gluteal continues to look better healthy tissue ooLeft heel continues to look better healthy granulation wound is smaller. He is using Iodoflex and his long as this continues continue the Iodoflex ooDorsal right foot looks better unfortunately dorsal left foot does not. There is swelling and erythema of his forefoot. He had minor trauma to this several days ago but doesn't think this was enough to have caused any tissue injury. Foot looks like cellulitis, we have had this problem before 06/27/18 on evaluation today patient appears to be doing [name] little worse  in regard to his foot ulcer. 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 Both of which would really be his only options for treating this infection. In the past he has been given and effusion of Orbactiv. This is done very well for him in the past again it's one time dosing IV antibiotic therapy. Subsequently I do believe this is something we're gonna need to see about doing at this point in time. Currently his other wounds seem to be doing somewhat better  in my pinion I'[name] pretty happy in that regard. 07/03/18 on evaluation today patient's wounds actually appear to be doing fairly well. He has been tolerating the dressing changes without complication. All in all he seems to be showing signs of improvement. In regard to the antibiotics he has been dealing with infectious disease since I saw him last week as far as getting this scheduled. In the end [facility]'s [facility] this coming Friday. In the meantime he has been continuing to perform the dressing changes in such as previous. There does not appear to be any evidence of infection worsengin at this time. 07/10/18; ooSince I last saw this man 2 weeks ago things have actually improved. IV antibiotics of resulted in less forefoot erythema although there is still some present. He is not systemically unwell ooLeft buttock wounds o2 now have no depth there is increased epithelialization Using silver alginate ooLeft heel still requires debridement using Iodoflex ooLeft dorsal foot still with [name] sizable wound about the size of [name] border but healthy granulation ooRight dorsal foot still with [name] slitlike area using silver alginate 07/18/18; the patient's cellulitis in the left foot is improved in fact I think it is on its way to resolving. ooLeft buttock wounds o2  both look better although the larger one has hypertension granulation we've been using silver alginate ooLeft heel has some thick circumferential redundant skin over the wound edge which will need to be removed today we've been using Iodoflex ooLeft dorsal foot is still [name] sizable wound required debridement using silver alginate ooThe right dorsal foot is just about closed only [name] small open area remains here 07/25/18; left foot cellulitis is resolved ooLeft buttock wounds o2 both look better. Hyper-granulation on the major area ooLeft heel as some debris over the surface but otherwise looks [name] healthier wound. Using silver collagen ooRight dorsal foot is just about closed 07/31/18; arrives with our intake nurse worried about purulent drainage from the buttock. We had hyper-granulation here last week ooHis buttock wounds o2 continue to look better ooLeft heel some debris over the surface but measuring smaller. ooRight dorsal foot unfortunately has openings between the toes ooLeft foot superficial wound looks less aggravated. 08/07/18 ooButtock wounds continue to look better although some of her granulation and the larger medial wound. silver alginate ooLeft heel continues to look [name] lot better.silver collagen ooLeft foot superficial wound looks less stable. Requires debridement. He has [name] new wound superficial area on the foot on the lateral dorsal foot. ooRight foot looks better using silver alginate without Lotrisone 08/14/2018; patient was in the ER last week diagnosed with [name] UTI. He is now on Cefpodoxime and Macrodantin. ooButtock wounds continued to be smaller. Using silver alginate ooLeft heel continues to look better using silver collagen ooLeft foot superficial wound looks as though it is improving ooRight dorsal foot area is just about healed. 08/21/2018; patient is completed his antibiotics for his UTI. ooHe has 2 open areas on the buttocks. There is still not closed  although the surface looks satisfactory. Using silver alginate ooLeft heel continues to improve using silver collagen ooThe bilateral dorsal foot areas which are at the base of his first and second toes/possible tinea pedis are actually stable on the left but worse on the right. The area on the left required debridement of necrotic surface. After debridement I obtained [name] specimen for PCR culture. ooThe right dorsal foot which is been just about healed  last week is now reopened 08/28/2018; culture done on the left dorsal foot showed coag negative staph both staph epidermidis and Lugdunensis. I think this is worthwhile initiating systemic treatment. I will use doxycycline given his long list of allergies. The area on the left heel slightly improved but still requiring debridement. ooThe large wound on the buttock is just about closed whereas the smaller one is larger. Using silver alginate in this area 09/04/2018; patient is completing his doxycycline for the left foot although this continues to be [name] very difficult wound area with very adherent necrotic debris. We are using silver alginate to all his wounds right foot left foot and the small wounds on his buttock, silver collagen on the left heel. 09/11/2018; once again this patient has intense erythema and swelling of the left forefoot. Lesser degrees of erythema in the right foot. He has [name] long list of allergies and intolerances. I will reinstitute doxycycline. 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 Using silver alginate ooLeft heel also looks better using silver collagen ooUnfortunately both the areas on his feet look worse. The area on the left first second webspace is now gone through to the plantar part of his foot. The area on the left foot anteriorly is irritated with erythema and swelling in the forefoot. 09/25/2018 ooHis wound on the left plantar heel looks better. Using silver  collagen ooThe area on the left buttock 2 small remnant areas. One is closed one is still open. Using silver alginate ooThe areas between both his first and second toes look worse. This in spite of long-standing antifungal therapy with ketoconazole and silver alginate which should have antifungal activity ooHe has small areas around his original wound on the left calf one is on the bottom of the original scar tissue and one superiorly both of these are small and superficial but again given wound history in this site this is worrisome 10/02/2018 ooLeft plantar heel continues to gradually contract using silver collagen ooLeft buttock wound is unchanged using silver alginate ooThe areas on his dorsal feet between his first and second toes bilaterally look about the same. 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 Small open area here. We have been using silver alginate 10/09/2018; ooLeft plantar heel continues to look healthy and contract. No debridement is required ooLeft buttock slightly smaller with [name] tape injury wound just below which was new this week ooDorsal feet somewhat improved I have been using clindamycin ooLeft lateral looks lower extremity the actual open area looks worse although [name] lot of this is epithelialized. I am going to change to silver collagen today He has [name] lot more swelling in the right leg although this is not pitting not red and not particularly warm there is [name] lot of spasm in the right leg usually indicative of people with paralysis of some underlying discomfort. We have reviewed his vascular status from 2017 he had [name] left greater saphenous vein ablation. I wonder about referring him back to vascular surgery if the area on the left leg continues to deteriorate. 10/16/2018 in today  for follow-up and management of multiple lower extremity ulcers. His left Buttock wound is much lower smaller and almost closed completely. The wound to the left ankle has began to reopen with Epithelialization and some  adherent slough. He has multiple new areas to the left foot and leg. The left dorsal foot without much improvement. Wound present between left great webspace and 2nd toe. Erythema and edema present right leg. Right LE ultrasound obtained on 10/10/18 was negative for DVT . 10/23/2018; ooLeft buttock is closed over. Still dry macerated skin but there is no open wound. I suspect this is chronic pressure/moisture ooLeft lateral calf is quite [name] bit worse than when I saw this last. There is clearly drainage here he has macerated skin into the left plantar heel. We will change the primary dressing to alginate ooLeft dorsal foot has some improvement in overall wound area. Still using clindamycin and silver alginate ooRight dorsal foot about the same as the left using clindamycin and silver alginate ooThe erythema in the right leg has resolved. He is DVT rule out was negative ooLeft heel pressure area required debridement although the wound is smaller and the surface is health 10/26/2018 ooThe patient came back in for his nurse check today predominantly because of the drainage coming out of the left lateral leg with [name] recent reopening of his original wound on the left lateral calf. He comes in today with [name] large amount of surrounding erythema around the wound extending from the calf into the ankle and even in the area on the dorsal foot. He is not systemically unwell. He is not febrile. Nevertheless this looks like cellulitis. We have been using silver alginate to the area. I changed him to [name] regular visit and I am going to prescribe him doxycycline. The rationale here is [name] long list of medication intolerances and [name] history of MRSA. I did not see anything that I thought would provide [name]  valuable culture 10/30/2018 ooFollow-up from his appointment 4 days ago with really an extensive area of cellulitis in the left calf left lateral ankle and left dorsal foot. I put him on doxycycline. He has [name] long list of medication allergies which are true allergy reactions. 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 In any case he is [name] lot better today. The erythema especially in the anterior and lateral left calf is better. He still has left ankle erythema. He also is complaining about increasing edema in the right leg we have only been using Kerlix Coban and he has been doing the wraps at home. Finally he has [name] spotty rash on the medial part of his upper left calf which looks like folliculitis or perhaps wrap occlusion type injury. Small superficial macules not pustules 11/06/18 patient arrives today with again [name] considerable degree of erythema around the wound on the left lateral calf extending into the dorsal ankle and dorsal foot. This is [name] lot worse than when I saw this last week. He is on doxycycline really with not [name] lot of improvement. He has not been systemically unwell Wounds on the; left heel actually looks improved. Original area on the left foot and proximity to the first and second toes looks about the same. He has superficial areas on the dorsal foot, anterior calf and then the reopening of his original wound on the left lateral calf which looks about the same ooThe only area he has on the right is the dorsal webspace first and second which is smaller. ooHe has [name] large area of dry erythematous skin on the left buttock small open area here. 11/13/2018; the patient arrives in much better condition. The erythema around  the wound on the left lateral calf is [name] lot better. Not sure whether this was the clindamycin or the TCA and ketoconazole or just in the improvement in edema control [stasis dermatitis]. In any case  this is [name] lot better. The area on the left heel is very small and just about resolved using silver collagen we have been using silver alginate to the areas on his dorsal feet 11/20/2018; his wounds include the left lateral calf, left heel, dorsal aspects of both feet just proximal to the first second webspace. He is stable to slightly improved. I did not think any changes to his dressings were going to be necessary 11/27/2018 he has [name] reopening on the left buttock which is surrounded by what looks like tinea or perhaps some other form of dermatitis. The area on the left dorsal foot has some erythema around it I have marked this area but I am not sure whether this is cellulitis or not. Left heel is not closed. Left calf the reopening is really slightly longer and probably worse 1/13; in general things look better and smaller except for the left dorsal foot. Area on the left heel is just about closed, left buttock looks better only [name] small wound remains in the skin looks better [using Lotrisone] 1/20; the area on the left heel only has [name] few remaining open areas here. Left lateral calf about the same in terms of size, left dorsal foot slightly larger right lateral foot still not closed. The area on the left buttock has no open wound and the surrounding skin looks [name] lot better 1/27; the area on the left heel is closed. Left lateral calf better but still requiring extensive debridements. The area on his left buttock is closed. He still has the open areas on the left dorsal foot which is slightly smaller in the right foot which is slightly expanded. We have been using Iodoflex on these areas as well 2/3; left heel is closed. Left lateral calf still requiring debridement using Iodoflex there is no open area on his left buttock however he has dry scaly skin over [name] large area of this. Not really responding well to the Lotrisone. Finally the areas on his dorsal feet at the level of the first second webspace are  slightly smaller on the right and about the same on the left. Both of these vigorously debrided with Anasept and gauze 2/10; left heel remains closed he has dry erythematous skin over the left buttock but there is no open wound here. Left lateral leg has come in and with. Still requiring debridement we have been using Iodoflex here. Finally the area on the left dorsal foot and right dorsal foot are really about the same extremely dry callused fissured areas. He does not yet have [name] dermatology appointment 2/17; left heel remains closed. He has [name] new open area on the left buttock. The area on the left lateral calf is bigger longer and still covered in necrotic debris. No major change in his foot areas bilaterally. I am awaiting for [name] dermatologist to look on this. We have been using ketoconazole I do not know that this is been doing any good at all. 2/24; left heel remains closed. The left buttock wound that was new reopening last week looks better. The left lateral calf appears better also although still requires debridement. The major area on his foot is the left first second also requiring debridement. We have been putting Prisma on all wounds. I do not  believe that the ketoconazole has done too much good for his feet. He will use Lotrisone I am going to give him [name] 2-week course of terbinafine. We still do not have [name] dermatology appointment 3/2 left heel remains closed however there is skin over bone in this area I pointed this out to him today. The left buttock wound is epithelialized but still does not look completely stable. The area on the left leg required debridement were using silver collagen here. With regards to his feet we changed to Lotrisone last week and silver alginate. 3/9; left heel remains closed. Left buttock remains closed. The area on the right foot is essentially closed. The left foot remains unchanged. Slightly smaller on the left lateral calf. Using silver collagen to both of  these areas 3/16-Left heel remains closed. Area on right foot is closed. Left lateral calf above the lateral malleolus open wound requiring debridement with easy bleeding. Left dorsal wound proximal to first toe also debrided. Left ischial area open new. Patient has been using Prisma with wrapping every 3 days. Dermatology appointment is apparently tomorrow.Patient has completed his terbinafine 2-week course with some apparent improvement according to him, there is still flaking and dry skin in his foot on the left 3/23; area on the right foot is reopened. The area on the left anterior foot is about the same still [name] very necrotic adherent surface. He still has the area on the left leg and reopening is on the left buttock. He apparently saw dermatology although I do not have [name] note. According to the patient who is usually fairly well informed they did not have any good ideas. Put him on oral terbinafine which she is been on before. 3/30; using silver collagen to all wounds. Apparently his dermatologist put him on doxycycline and rifampin presumably some culture grew staph. I do not have this result. He remains on terbinafine although I have used terbinafine on him before 4/6; patient has had [name] fairly substantial reopening on the right foot between the first and second toes. He is finished his terbinafine and I believe is on doxycycline and rifampin still as prescribed by dermatology. We have been using silver collagen to all his wounds although the patient reports that he thinks silver alginate does better on the wounds on his buttock. 4/13; the area on his left lateral calf about the same size but it did not require debridement. ooLeft dorsal foot just proximal to the webspace between the first and second toes is about the same. Still nonviable surface. I note some superficial bronze discoloration of the dorsal part of his foot ooRight dorsal foot just proximal to the first and second toes also  looks about the same. I still think there may be the same discoloration I noted above on the left ooLeft buttock wound looks about the same 4/20; left lateral calf appears to be gradually contracting using silver collagen. 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 The left dorsal foot wound is debrided of tightly adherent necrotic debris and really cleans up quite nicely. The right area is worse with expansion. I did not debride this it is now over the base of the second toe ooThe area on his left buttock is smaller no debridement is required using silver collagen 5/4; left calf continues to make good progress. ooHe arrives with erythema around the wounds on his dorsal foot which even extends to the plantar aspect. Very concerning for coexistent infection. He is  finished the erythromycin I gave him for possible erythrasma this does not seem to have helped. ooThe area on the left foot is about the same base of the dorsal toes ooIs area on the buttock looks improved on the left 5/11; left calf and left buttock continued to make good progress. Left foot is about the same to slightly improved. ooMajor problem is on the right foot. He has not had an x-ray. Deep tissue culture I did last week showed both Enterobacter and E. coli. I did not change the doxycycline I put him on empirically although neither 1 of these were plated to doxycycline. He arrives today with the erythema looking worse on both the dorsal and plantar foot. Macerated skin on the bottom of the foot. he has not been systemically unwell 5/18-Patient returns at 1 week, left calf wound appears to be making some progress, left buttock wound appears slightly worse than last time, left foot wound looks slightly better, right foot redness is marginally better. X-ray of both feet show no air or evidence of osteomyelitis. Patient is finished his Omnicef and terbinafine. He  continues to have macerated skin on the bottom of the left foot as well as right 5/26; left calf wound is better, left buttock wound appears to have multiple small superficial open areas with surrounding macerated skin. X-rays that I did last time showed no evidence of osteomyelitis in either foot. He is finished cefdinir and doxycycline. I do not think that he was on terbinafine. He continues to have [name] large superficial open area on the right foot anterior dorsal and slightly between the first and second toes. I did send him to dermatology 2 months ago or so wondering about whether they would do [name] fungal scraping. I do not believe they did but did do [name] culture. We have been using silver alginate to the toe areas, he has been using antifungals at home topically either ketoconazole or Lotrisone. We are using silver collagen on the left foot, silver alginate on the right, silver collagen on the left lateral leg and silver alginate on the left buttock 6/1; left buttock area is healed. We have the left dorsal foot, left lateral leg and right dorsal foot. We are using silver alginate to the areas on both feet and silver collagen to the area on his left lateral calf 6/8; the left buttock apparently reopened late last week. He is not really sure how this happened. He is tolerating the terbinafine. Using silver alginate to all wounds 6/15; left buttock wound is larger than last week but still superficial. ooCame in the clinic today with [name] report of purulence from the left lateral leg I did not identify any infection ooBoth areas on his dorsal feet appear to be better. He is tolerating the terbinafine. Using silver alginate to all wounds 6/22; left buttock is about the same this week, left calf quite [name] bit better. His left foot is about the same however he comes in with erythema and warmth in the right forefoot once again. Culture that I gave him in the beginning of May showed Enterobacter and E. coli. I  gave him doxycycline and things seem to improve although neither 1 of these organisms was specifically plated. 6/29; left buttock is larger and dry this week. Left lateral calf looks to me to be improved. Left dorsal foot also somewhat improved right foot completely unchanged. The erythema on the right foot is still present. He is completing the Ceftin dinner that I gave  him empirically [see discussion above.) 7/6 - All wounds look to be stable and perhaps improved, the left buttock wound is slightly smaller, per patient bleeds easily, completed ceftin, the right foot redness is less, he is on terbinafine 7/13; left buttock wound about the same perhaps slightly narrower. Area on the left lateral leg continues to narrow. Left dorsal foot slightly smaller right foot about the same. We are using silver alginate on the right foot and Hydrofera Blue to the areas on the left. Unna boot on the left 2 layer compression on the right 7/20; left buttock wound absolutely the same. Area on lateral leg continues to get better. Left dorsal foot require debridement as did the right no major change in the 7/27; left buttock wound the same size necrotic debris over the surface. The area on the lateral leg is closed once again. His left foot looks better right foot about the same although there is some involvement now of the posterior first second toe area. He is still on terbinafine which I have given him for [name] month, not certain [name] centimeter major change 06/25/19-All wounds appear to be slightly improved according to report, left buttock wound looks clean, both foot wounds have minimal to no debris the right dorsal foot has minimal slough. We are using Hydrofera Blue to the left and silver alginate to the right foot and ischial wound. 8/10-Wounds all appear to be around the same, the right forefoot distal part has some redness which was not there before, however the wound looks clean and small. Ischial wound looks  about the same with no changes 8/17; his wound on the left lateral calf which was his original chronic venous insufficiency wound remains closed. Since I last saw him the areas on the left dorsal foot right dorsal foot generally appear better but require debridement. The area on his left initial tuberosity appears somewhat larger to me perhaps hyper granulated and bleeds very easily. We have been using Hydrofera Blue to the left dorsal foot and silver alginate to everything else 8/24; left lateral calf remains closed. The areas on his dorsal feet on the webspace of the first and second toes bilaterally both look better. The area on the left buttock which is the pressure ulcer stage II slightly smaller. I change the dressing to Hydrofera Blue to all areas 8/31; left lateral calf remains closed. The area on his dorsal feet bilaterally look better. Using Hydrofera Blue. Still requiring debridement on the left foot. No change in the left buttock pressure ulcers however 9/14; left lateral calf remains closed. Dorsal feet look quite [name] bit better than 2 weeks ago. Flaking dry skin also [name] lot better with the ammonium lactate I gave him 2 weeks ago. The area on the left buttock is improved. He states that his Roho cushion developed [name] leak and he is getting [name] new one, in the interim he is offloading this vigorously 9/21; left calf remains closed. Left heel which was [name] possible DTI looks better this week. He had macerated tissue around the left dorsal foot right foot looks satisfactory and improved left buttock wound. I changed his dressings to his feet to silver alginate bilaterally. Continuing Hydrofera Blue on the left buttock. 9/28 left calf remains closed. Left heel did not develop anything [possible DTI] dry flaking skin on the left dorsal foot. Right foot looks satisfactory. Improved left buttock wound. We are using silver alginate on his feet Hydrofera Blue on the buttock. I have asked him to  go back to  the Lotrisone on his feet including the wounds and surrounding areas 10/5; left calf remains closed. The areas on the left and right feet about the same. [name] lot of this is epithelialized however debris over the remaining open areas. He is using Lotrisone and silver alginate. The area on the left buttock using Hydrofera Blue 10/26. Patient has been out for 3 weeks secondary to Covid concerns. He tested negative but I think his wife tested positive. He comes in today with the left foot substantially worse, right foot about the same. Even more concerning he states that the area on his left buttock closed over but then reopened and is considerably deeper in one aspect than it was before [stage III wound] 11/2; left foot really about the same as last week. Quarter sized wound on the dorsal foot just proximal to the first second toes. Surrounding erythema with areas of denuded epithelium. This is not really much different looking. Did not look like cellulitis this time however. ooRight foot area about the same.. We have been using silver alginate alginate on his toes ooLeft buttock still substantial irritated skin around the wound which I think looks somewhat better. We have been using Hydrofera Blue here. 11/9; left foot larger than last week and [name] very necrotic surface. Right foot I think is about the same perhaps slightly smaller. Debris around the circumference also addressed. Unfortunately on the left buttock there is been [name] decline. Satellite lesions below the major wound distally and now [name] an additional one posteriorly we have been using Hydrofera Blue but I think this is [name] pressure issue 11/16; left foot ulcer dorsally again [name] very adherent necrotic surface. Right foot is about the same. Not much change in the pressure ulcer on his left buttock. 11/30; left foot ulcer dorsally basically the same as when I saw him 2 weeks ago. Very adherent fibrinous debris on the wound surface. Patient reports [name]  lot of drainage as well. 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 We have been using Iodoflex, patient changed back to alginate because of the drainage. Area on his right dorsal foot really looks benign with [name] healthier surface certainly [name] lot better than on the left. Left buttock wounds all improved using Hydrofera Blue 12/7; left dorsal foot again no improvement. Tightly adherent debris. PCR culture I did last week only showed likely skin contaminant. I have gone ahead and done [name] punch biopsy of this which is about the last thing in terms of investigations I can think to do. He has known venous insufficiency and venous hypertension and this could be the issue here. The area on the right foot is about the same left buttock slightly worse according to our intake nurse secondary to [facility] sticking to the wound 12/14; biopsy of the left foot that I did last time showed changes that could be related to wound healing/chronic stasis dermatitis phenomenon no neoplasm. We have been using silver alginate to both feet. I change the one on the left today to Sorbact and silver alginate to his other 2 wounds 12/28; the patient arrives with the following problems; ooMajor issue is the dorsal left foot which continues to be [name] larger deeper wound area. Still with [name] completely nonviable surface ooParadoxically the area mirror image on the right on the right dorsal foot appears to be getting better. ooHe had some loss of dry denuded skin from the lower part of his  original wound on the left lateral calf. Some of this area looked [name] little vulnerable and for this reason we put him in wrap that on this side this week ooThe area on his left buttock is larger. He still has the erythematous circular area which I think is [name] combination of pressure, sweat. This does not look like cellulitis or fungal dermatitis 11/26/2019; -Dorsal left foot large open wound with  depth. Still debris over the surface. Using Sorbact ooThe area on the dorsal right foot paradoxically has closed over [facility] has [name] reopening on the left ankle laterally at the base of his original wound that extended up into the calf. This appears clean. ooThe left buttock wound is smaller but with very adherent necrotic debris over the surface. We have been using silver alginate here as well The patient had arterial studies done in 2017. He had biphasic waveforms at the dorsalis pedis and posterior tibial bilaterally. ABI in the left was 1.17. Digit waveforms were dampened. He has slight spasticity in the great toes I do not think [name] TBI would be possible 1/11; the patient comes in today with [name] sizable reopening between the first and second toes on the right. This is not exactly in the same location where we have been treating wounds previously. According to our intake nurse this was actually fairly deep but 0.6 cm. The area on the left dorsal foot looks about the same the surface is somewhat cleaner using Sorbact, his MRI is in 2 days. We have not managed yet to get arterial studies. The new reopening on the left lateral calf looks somewhat better using alginate. The left buttock wound is about the same using alginate 1/18; the patient had his ARTERIAL studies which were quite normal. ABI in the right at 1.13 with triphasic/biphasic waveforms on the left ABI 1.06 again with triphasic/biphasic waveforms. It would not have been possible to have done [name] toe brachial index because of spasticity. We have been using Sorbac to the left foot alginate to the rest of his wounds on the right foot left lateral calf and left buttock 1/25; arrives in clinic with erythema and swelling of the left forefoot worse over the first MTP area. This extends laterally dorsally and but also posteriorly. Still has an area on the left lateral part of the lower part of his calf wound it is eschared and clearly not  closed. ooArea on the left buttock still with surrounding irritation and erythema. ooRight foot surface wound dorsally. The area between the right and first and second toes appears better. 2/1; ooThe left foot wound is about the same. Erythema slightly better I gave him [name] week of doxycycline empirically ooRight foot wound is more extensive extending between the toes to the plantar surface ooLeft lateral calf really no open surface on the inferior part of his original wound however the entire area still looks vulnerable ooAbsolutely no improvement in the left buttock wound required debridement. 2/8; the left foot is about the same. Erythema is slightly improved I gave him clindamycin last week. ooRight foot looks better he is using Lotrimin and silver alginate ooHe has [name] breakdown in the left lateral calf. Denuded epithelium which I have removed ooLeft buttock about the same were using Hydrofera Blue 2/15; left foot is about the same there is less surrounding erythema. Surface still has tightly adherent debris which I have debriding however not making any progress ooRight foot has [name] substantial wound on the medial right second toe between the  first and second webspace. ooStill an open area on the left lateral calf distal area. ooButtock wound is about the same 2/22; left foot is about the same less surrounding erythema. Surface has adherent debris. Polymen Ag Right foot area significant wound between the first and second toes. We have been using silver alginate here Left lateral leg polymen Ag at the base of his original venous insufficiency wound ooLeft buttock some improvement here 3/1; ooRight foot is deteriorating in the first second toe webspace. Larger and more substantial. We have been using silver alginate. ooLeft dorsal foot about the same markedly adherent surface debris using PolyMem Ag ooLeft lateral calf surface debris using PolyMem AG ooLeft buttock is improved  again using PolyMem Ag. ooHe is completing his terbinafine. The erythema in the foot seems better. He has been on this for 2 weeks 3/8; no improvement in any wound area in fact he has [name] small open area on the dorsal midfoot which is new this week. He has not gotten his foot x-rays yet 3/15; his x-rays were both negative for osteomyelitis of both feet. No major change in any of his wounds on the extremities however his buttock wounds are better. We have been using polymen on the buttocks, left lower leg. Iodoflex on the left foot and silver alginate on the right 3/22; arrives in clinic today with the 2 major issues are the improvement in the left dorsal foot wound which for once actually looks healthy with [name] nice healthy wound surface without debridement. Using Iodoflex here. Unfortunately on the left lateral calf which is in the distal part of his original wound he came to the clinic here for there was purulent drainage noted some increased breakdown scattered around the original area and [name] small area proximally. We we are using polymen here will change to silver alginate today. His buttock wound on the left is better and I think the area on the right first second toe webspace is also improved 3/29; left dorsal foot looks better. Using Iodoflex. Left ankle culture from deterioration last time grew E. coli, Enterobacter and Enterococcus. I will give him [name] course of cefdinir although that will not cover Enterococcus. The area on the right foot in the webspace of the first and second toe lateral first toe looks better. The area on his buttock is about healed Vascular appointment is on April 21. This is to look at his venous system vis--vis continued breakdown of the wounds on the left including the left lateral leg and left dorsal foot he. He has had previous ablations on this side 4/5; the area between the right first and second toes lateral aspect of the first toe looks better. Dorsal aspect of the  left first toe on the left foot also improved. Unfortunately the left lateral lower leg is larger and there is [name] second satellite wound superiorly. The usual superficial abrasions on the left buttock overall better but certainly not closed 4/12; the area between the right first and second toes is improved. Dorsal aspect of the left foot also slightly smaller with [name] vibrant healthy looking surface. No real change in the left lateral leg and the left buttock wound is healed He has an unaffordable co-pay for Apligraf. Appointment with vein and vascular with regards to the left leg venous part of the circulation is on 4/21 4/19; we continue to see improvement in all wound areas. Although this is minor. He has his vascular appointment on 4/21. The area on the left buttock  has not reopened although right in the center of this area the skin looks somewhat threatened 4/26; the left buttock is unfortunately reopened. In general his left dorsal foot has [name] healthy surface and looks somewhat smaller although it was not measured as such. The area between his first and second toe webspace on the right as [name] small wound against the first toe. The patient saw vascular surgery. The real question I was asking was about the small saphenous vein on the left. He has previously ablated left greater saphenous vein. Nothing further was commented on on the left. Right greater saphenous vein without reflux at the saphenofemoral junction or proximal thigh there was no indication for ablation of the right greater saphenous vein duplex was negative for DVT bilaterally. They did not think there was anything from [name] point of view that could be offered. They ABIs within normal limits 5/3; only small open area on the left buttock. The area on the left lateral leg which was his original venous reflux is now 2 wounds both which look clean. We are using Iodoflex on the left dorsal foot which looks healthy and smaller. He  is down to [name] very tiny area between the right first and second toes, using silver alginate 5/10; all of his wounds appear better. We have much better edema control in 4 layer compression on the left. This may be the factor that is allowing the left foot and left lateral calf to heal. He has external compression garments at home 04/14/20-All of his wounds are progressing well, the left forefoot is practically closed, left ischium appears to be about the same, right toe webspace is also smaller. The left lateral leg is about the same, continue using Hydrofera Blue to this, silver alginate to the ischium, Iodoflex to the toe space on the right 6/7; most of his wounds outside of the left buttock are doing well. The area on the left lateral calf and left dorsal foot are smaller. The area on the right foot in between the first and second toe webspace is barely visible although he still says there is some drainage here is the only reason I did not heal this out. ooUnfortunately the area on the left buttock almost looks like he has [name] skin tear from tape. He has open wound and then [name] large flap of skin that we are trying to get adherence over an area just next to the remaining wound 6/21; 2 week follow-up. I believe is been here for nurse visits. Miraculously the area between his first and second toes on the left dorsal foot is closed over. Still open on the right first second web space. The left lateral calf has 2 open areas. Distally this is more superficial. The proximal area had [name] little more depth and required debridement of adherent necrotic material. His buttock wound is actually larger we have been using silver alginate here 6/28; the patient's area on the left foot remains closed. Still open wet area between the first and second toes on the right and also extending into the plantar aspect. We have been using silver alginate in this location. He has 2 areas on the left lower leg part of his original  long wounds which I think are better. We have been using Hydrofera Blue here. Hydrofera Blue to the left buttock which is stable 7/12; left foot remains closed. Left ankle is closed. May be [name] small area between his right first and second toes the only truly  open area is on the left buttock. We have been using Hydrofera Blue here 7/19; patient arrives with marked deterioration especially in the left foot and ankle. We did not put him in [name] compression wrap on the left last week in fact he wore his juxta lite stockings on either side although he does not have an underlying stocking. He has [name] reopening on the left dorsal foot, left lateral ankle and [name] new area on the right dorsal ankle. More worrisome is the degree of erythema on the left foot extending on the lateral foot into the lateral lower leg on the left 7/26; the patient had erythema and drainage from the lateral left ankle last week. Culture of this grew MRSA resistant to doxycycline and clindamycin which are the 2 antibiotics we usually use with this patient who has multiple antibiotic allergies including linezolid, trimethoprim sulfamethoxazole. I had give him an empiric doxycycline and he comes in the area certainly looks somewhat better although it is blotchy in his lower leg. He has not been systemically unwell. He has had areas on the left dorsal foot which is [name] reopening, chronic wounds on the left lateral ankle. Both of these I think are secondary to chronic venous insufficiency. The area between his first and second toes is closed as far as I can tell. He had [name] new wrap injury on the right dorsal ankle last week. Finally he has an area on the left buttock. We have been using silver alginate to everything except the left buttock we are using Hydrofera Blue 06/30/20-Patient returns at 1 week, has been given [name] sample dose pack of NUZYRA which is [name] tetracycline derivative [omadacycline], patient has completed those, we have been using silver  alginate to almost all the wounds except the left ischium where we are using Hydrofera Blue all of them look better 8/16; since I last saw the patient he has been doing well. The area on the left buttock, left lateral ankle and left foot are all closed today. He has completed the [place] I gave him last time and tolerated this well. He still has open areas on the right dorsal ankle and in the right first second toe area which we are using silver alginate. 8/23; we put him in his bilateral external compression stockings last week as he did not have anything open on either leg except for concerning area between the right first and second toe. He comes in today with an area on the left dorsal foot slightly more proximal than the original wound, the left lateral foot but this is actually [name] continuation of the area he had on the left lateral ankle from last time. As well he is opened up on the left buttock again. 8/30; comes in today with things looking [name] lot better. The area on the left lower ankle has closed down as has the left foot but with eschar in both areas. The area on the dorsal right ankle is also epithelialized. Very little remaining of the left buttock wound. We have been using silver alginate on all wound areas 9/13; the area in the first second toe webspace on the right has fully epithelialized. He still has some vulnerable epithelium on the right and the ankle and the dorsal foot. He notes weeping. He is using his juxta lite stocking. On the left again the left dorsal foot is closed left lateral ankle is closed. We went to the juxta lite stocking here as well. ooStill vulnerable in the left buttock  although only 2 small open areas remain here 9/27; 2-week follow-up. We did not look at his left leg but the patient says everything is closed. He is [name] bit disturbed by the amount of edema in his left foot he is using juxta lite stockings but asking about over the toes stockings which would be  30/40, will talk to him next time. According to him there is no open wound on either the left foot or the left ankle/calf He has an open area on the dorsal right calf which I initially point [name] wrap injury. He has superficial remaining wound on the left ischial tuberosity been using silver alginate although he says this sticks to the wound 10/5; we gave him 2-week follow-up but he called yesterday expressing some concerns about his right foot right ankle and the left buttock. He came in early. There is still no open areas on the left leg and that still in his juxta lite stocking 10/11; he only has 1 small area on the left buttock that remains measuring millimeters 1 mm. Still has the same irritated skin in this area. We recommended zinc oxide when this eventually closes and pressure relief is meticulously is he can do this. He still has an area on the dorsal part of his right first through third toes which is [name] bit irritated and still open and on the dorsal ankle near the crease of the ankle. We have been using silver alginate and using his own stocking. He has nothing open on the left leg or foot 10/25; 2-week follow-up. Not nearly as good on the left buttock as I was hoping. For open areas with 5 looking threatened small. He has the erythematous irritated chronic skin in this area. oo1 area on the right dorsal ankle. He reports this area bleeds easily ooRight dorsal foot just proximal to the base of his toes ooWe have been using silver alginate. 11/8; 2-week follow-up. Left buttock is about the same although I do not think the wounds are in the same location we have been using silver alginate. I have asked him to use zinc oxide on the skin around the wounds. ooHe still has [name] small area on the right dorsal ankle he reports this bleeds easily ooRight dorsal foot just proximal to the base of the toes does not have anything open although the skin is very dry and scaly ooHe has [name] new opening on  the nailbed of the left great toe. Nothing on the left ankle 11/29; 3-week follow-up. Left buttock has 2 open areas. And washing of these wounds today started bleeding easily. Suggesting very friable tissue. We have been using silver alginate. Right dorsal ankle which I thought was initially [name] wrap injury we have been using silver alginate. Nothing open between the toes that I can see. He states the area on the left dorsal toe nailbed healed after the last visit in 2 or 3 days 12/13; 3-week follow-up. His left buttock now has 3 open areas but the original 2 areas are smaller using polymen here. Surrounding skin looks better. The right dorsal ankle is closed. He has [name] small opening on the right dorsal foot at the level of the third toe. In general the skin looks better here. He is wearing his juxta lite stocking on the left leg says there is nothing open 11/24/2020; 3 weeks follow-up. His left buttock still has the 3 open areas. We have been using polymen but due to lack of response he changed to  Hydrofera Blue area. Surrounding skin is dry erythematous and irritated looking. There is no evidence of infection either bacterial or fungal however there is loss of surface epithelium ooHe still has very dry skin in his foot causing irritation and erythema on the dorsal part of his toes. This is not responded to prolonged courses of antifungal simply looks dry and irritated 1/24; left buttock area still looks about the same he was unable to find the triad ointment that we had suggested. The area on the right lower leg just above the dorsal ankle has reopened and the areas on the right foot between the first second and second third toes and scaling on the bottom of the foot has been about the same for quite some time now. been using silver alginate to all wound areas 2/7; left buttock wound looked quite good although not much smaller in terms of surface area surrounding skin looks better. Only [name] few dry  flaking areas on the right foot in between the first and second toes the skin generally looks better here [ammonium lactate]. Finally the area on the right dorsal ankle is closed 2/21; ooThere is no open area on the right foot even between the right first and second toe. Skin around this area dorsally and plantar aspects look better. ooHe has [name] reopening of the area on the right ankle just above the crease of the ankle dorsally. I continue to think that this is probably friction from spasms may be even this time with his stocking under the compression stockings. ooWounds on his left buttock look about the same there [name] couple of areas that have reopened. He has [name] total square area of loss of epithelialization. This does not look like infection it looks like [name] contact dermatitis but I just cannot determine to what 3/14; there is nothing on the right foot between the first and second toes this was carefully inspected under illumination. Some chronic irritation on the dorsal part of his foot from toes 1-3 at the base. Nothing really open here substantially. Still has an area on the right foot/ankle that is actually larger and hyper granulated. His buttock area on the left is just about closed however he has chronic inflammation with loss of the surface epithelial layer 3/28; 2-week follow-up. In clinic today with [name] new wound on the left anterior mid tibia. Says this happened about 2 weeks ago. He is not really sure how wonders about the spasticity of his legs at night whether that could have caused this other than that he does not have [name] good idea. He has been using topical antibiotics and silver alginate. The area on his right dorsal ankle seems somewhat better. ooFinally everything on his left buttock is closed. 4/11; 2-week follow-up. All of his wounds are better except for the area over the ischium and left buttock which have opened up widely again. At least part of this is covered in necrotic  fibrinous material another part had rolled nonviable skin. The area on the right ankle, left anterior mid tibia are both [name] lot better. He had no open wounds on either foot including the areas between the first and second toes 4/25; patient presents for 2-week follow-up. He states that the wounds are overall stable. He has no complaints today and states he is using Hydrofera Blue to open wounds. 5/9; have not seen this man in over [name] month. For my memory he has open areas on the left mid tibia and right ankle. T oday  he has new open area on the right dorsal foot which we have not had [name] problem with recently. He has the sustained area on the left buttock He is also changed his insurance at the beginning of the year [employer]. We will need prior authorizations for debridement 5/23; patient presents for 2-week follow-up. He has prior authorizations for debridement. He denies any issues in the past 2 weeks with his wound care. He has been using Hydrofera Blue to all the wounds. He does report [name] circular rash to the upper left leg that is new. He denies acute signs of infection. 6/6; 2-week follow-up. The patient has open wounds on the left buttock which are worse than the last time I saw this about [name] month ago. He also has [name] new area to me on the left anterior mid tibia with some surrounding erythema. The area on the dorsal ankle on the right is closed but I think this will be [name] friction injury every time this area is exposed to either our wraps or his compression stockings caused by unrelenting spasms in this leg. 6/20; 2-week follow-up. oo The patient has open wounds on the left buttock which is about the same. Using [facility] here. - The left mid tibia has [name] static amount of surrounding erythema. Also [name] raised area in the center. We have been using Hydrofera Blue here. ooooFinally he has broken down in his dorsal right foot extending between the first and second toes and going to the base of  the first and second toe webspace. I have previously assumed that this was severe venous hypertension 7/5; 2-week follow-up oo The left buttock wound actually looks better. We are using Hydrofera Blue. He has extensive skin irritation around this area and I have not really been able to get that any better. I have tried Lotrisone i.e. antifungals and steroids. More most recently we have just been using Coloplast really looks about the same. oo The left mid tibia which was new last week culture to have very resistant staph aureus. Not only methicillin-resistant but doxycycline resistant. The patient has [name] plethora of antibiotic allergies including sulfa, linezolid. I used topical bacitracin on this but he has not started this yet. oo In addition he has an expanding area of erythema with [name] wound on the dorsal right foot. I did [name] deep tissue culture of this area today 7/12; oo Left buttock area actually looks better surrounding skin also looks less irritated. oo Left mid tibia looks about the same. He is using bacitracin this is not worse oo Right dorsal foot looks about the same as well. oo The left first toe also looks about the same 7/19; left buttock wound continues to improve in terms of open areas oo Left mid tibia is still concerning amount of swelling he is using bacitracin oo Dorsal left first toe somewhat smaller oo Right dorsal foot somewhat smaller 7/25; left buttock wound actually continues to improve oo Left mid tibia area has less swelling. I gave him all my samples of new Nuzyra. This seems to have helped although the wound is still open it. His abrasion closed by here oo Left dorsal great toe really no better. Still [name] very nonviable surface oo Right dorsal foot perhaps some better. oo We have been using bacitracin and silver nitrate to the areas on his lower legs and Hydrofera Blue to the area on the buttock. 8/16 oo Disappointed that his left buttock wound is  actually more substantial. Apparently  during the last nurse visit these were both very small. He has continued irritation to [name] large area of skin on his buttock. I have never been able to totally explain this although I think it some combination of the way he sits, pressure, moisture. He is not incontinent enough to contribute to this. oo Left dorsal great toe still fibrinous debris on the surface that I have debrided today oo Large area across the dorsal right toes. oo The area on the left anterior mid tibia has less swelling. He completed the [place]. This does not look infected although the tissue is still fried 8/30; 2-week follow-up. oo Left buttock areas not improved. We used Hydrofera Blue on this. Weeping wet with the surrounding erythema that I have not been able to control even with Lotrisone and topical Coloplast oo Left dorsal great toe looks about the same oo More substantial area again at the base of his toes on the left which is new this week. oo Area across the dorsal right toes looks improved oo The left anterior mid tibia looks like it is trying to close 9/13; 2-week follow-up. Using silver alginate on all of his wounds. The left dorsal foot does not look any better. He has the area on the dorsal toe and also the areas at the base of all of his toes 1 through 3. On the right foot he has [name] similar pattern in [name] similar area. He has the area on his left mid tibia that looks fairly healthy. Finally the area on his left buttock looks somewhat bette 9/20; culture I did of the left foot which was [name] deep tissue culture last time showed E. coli he has erythema around this wound. Still [name] completely necrotic surface. His right dorsal foot looks about the same. He has [name] very friable surface to the left anterior mid tibia. Both buttock wounds look better. We have been using silver alginate to all wounds 10/4; he has completed the cephalexin that I gave him last time for the left foot.  He is using topical gentamicin under silver alginate silver alginate being applied to all the wounds. Unfortunately all the wounds look irritated on his dorsal right foot dorsal left foot left mid tibia. I wonder if this could be [name] silver allergy. I am going to change him to [facility] on the lower extremity. The skin on the left buttock and left posterior thigh still flaking dry and irritated. This has continued no matter what I have applied topically to this. He has [name] solitary open wound which by itself does not look too bad however the entire area of surrounding skin does not change no matter what we have applied here 10/18; the area on the left dorsal foot and right dorsal foot both look better. The area on the right extends into the plantar but not between his toes. We have been using silver alginate. He still has [name] rectangular erythematous area around the area on the left tibia. The wound itself is very small. Finally everything on his left buttock looks [name] little larger the skin is erythematous 11/15; patient comes in with the left dorsal and right dorsal foot distally looking somewhat better. Still nonviable surface on the left foot which required debridement. He still has the area on the left anterior mid tibia although this looks somewhat better. He has [name] new area on the right lateral lower leg just above the ankle. Finally his left buttock looks terrible with multiple superficial open wounds  geometric square shaped area of chronic erythema which I have not been able to sort through 11/29; right dorsal foot and left dorsal foot both look somewhat better. No debridement required. He has the fragile area on the left anterior mid tibia this looks and continues to look somewhat better. Right lateral lower leg just above the ankle we identified last time also looks better. In general the area on his left buttock looks improved. We are using Hydrofera Blue to all wound areas 12/13; right dorsal  foot looks better. The area on the right lateral leg is healed. Left dorsal foot has 2 open areas both of which require debridement. The fragile area on the left anterior mid tibial looks better. Smaller area on his buttocks. Were using Hydrofera Blue 1/10; patient comes in with everything looking slightly larger and/or worse. This includes his left buttock, reopening of the left mid tibia, larger areas on the left dorsal foot and what looks to be [name] cellulitis on the right dorsal and plantar foot. We have been using Hydrofera Blue on all wounds. 1/17; right dorsal foot distally looks better today. The left foot has 2 open wounds that are about the same surrounding erythema. 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 No malignancy they did not stain for fungus 1/24; his right distal foot is not closed dry and scaly but the wound looks like it is contracting. I did not debride anything here. Problem on his left dorsal foot with expanding erythema. Apparently there were problems last week getting the Elesa Hacker however it is now available at the [facility] but [name] week later. He is using ketoconazole and Coloplast to the left buttock along with Hydrofera Blue this actually looks quite [name] bit better today. 1/31; right dorsal foot again is dry and scaly but looking to contract. He has been using [name] moisturizer on his feet at my request but he is not sure which 1. The left dorsal foot wounds look about the same there is erythema here that I marked last week however after course of Nuzyra it certainly is not any better but not any worse either. Finally on his left buttock the skin continues to look better he has the original wound but [name] new substantial area towards the gluteal cleft. Almost like [name] skin tear. I used scissors to remove skin and subcutaneous tissue here silver  nitrate and direct pressure 2/7; right dorsal foot. This does not look too much different from last week. Some erythema skin dry and scaly. No debridement. Left dorsal great toe again still not much improvement. I did remove flaking dry skin and callus from around the edge. Finally on his left buttock. The skin is somewhat better in the periwound. Surface wounds are superficial somewhat better than last week. 01/26/2022: Is [name] little bit of [name] mystery as to why his wounds fail to respond to treatments and actually seem to get worse. This is my first encounter with this patient. He was previously followed by Dr. Dellia Nims. Based upon my review of the chart, it seems that there is [name] little bit of [name] mystery as to why his wounds do not respond as anticipated to the interventions applied and sometimes even get worse. Biopsies have been performed and he was seen by dermatology in [place], but that did not shed any light on the matter. T oday, his gluteal wound is larger,  with substantial drainage, rather malodorous. The food wounds are not terrible, but he has [name] lot of callus and scaly skin around these. He is currently getting silver alginate on the gluteal wound, with idodoflex to the feet. He is using lotrisone on his legs for the dry, scaly skin. 02/09/2022: There has really been no change to any of his wounds. The gluteal wound less drainage and odor, but remains about the same size, the periwound skin remains oddly scaly. His lower extremity wounds also appear roughly the same size. They continue to accumulate [name] small amount of slough. The periwound on his feet and ankle wounds has dry eschar and loose dead skin. We have been using silver alginate on the gluteal wound and Iodoflex on his feet and ankle wounds. T the periwound around his gluteal lesion and Lotrisone on his feet and legs. o 02/23/2022: The right plantar foot wound is closed. The gluteal site looks small but has continued to produce hypertrophic  granulation tissue. The foot wounds all look about the same on the dorsal surface of the right foot; on the left, there is only [name] small open area at the site of where his left great toenail would have been. 03/16/2022: The right ankle wound is healed. The right dorsal foot wound is about the same. The left dorsal foot wound is quite [name] bit smaller and the ischial wound is nearly closed. 03/30/2022: The right ankle wound reopened. Both dorsal foot wounds are quite [name] bit smaller. Unfortunately, he appears to have sheared part of his ischial wound open further, perhaps during [name] transfer. 04/13/2022: The right ankle wound has hypertrophic granulation tissue present. The dorsal foot wounds continue to decrease in size. The ischial wound looks about the same today, no better, no worse. 04/27/2022: The right ankle wound has closed. Unfortunately, it looks like some moisture got underneath the dry skin on both of his dorsal feet and these wounds have expanded in size. The ischial wound remains the same with perhaps [name] little bit more slough accumulation than at our previous visit. 05/11/2022: The right ankle wound remains closed. There is [name] left anterior tibial wound that is small has patchy openings with accumulated slough. The dorsum of his right foot appears to be nearly healed with just [name] small punctate opening. The plantar surface of his right foot has [name] new opening that looks like he may have picked some skin there. His sacral ulcer has hypertrophic granulation tissue but has some slough accumulation. The dorsum of his left foot has multiple open areas in [name] fairly ragged distribution. All of these have slough accumulated within them. 06/01/2022: The right ankle and left anterior tibial wound are both closed. Dorsum of his right foot and left foot both look substantially better with just tiny scattered openings The without any slough accumulation. He has sheared open new areas on his left gluteus and ischium. He  says that his wheelchair cushion, which is air-filled, has [name] leak and so it keeps deflating. He is awaiting [name] new cushion. 06/15/2022: The right ankle wound has reopened and the fat layer is exposed. Both dorsal feet have just small openings with just [name] little bit of slough and eschar accumulation. The wound on his left gluteus and ischium is larger again today and has [name] foul odor. Patient History Information obtained from Patient. Family History Diabetes - Father, Hypertension - Mother, No family history of Cancer, Heart Disease, Hereditary Spherocytosis, Kidney Disease, Lung Disease, Stroke, Thyroid Problems, Tuberculosis. Social History  Former smoker, Marital Status - Married, Alcohol Use - Moderate, Drug Use - No History, Caffeine Use - Daily. Medical History Ear/Nose/Mouth/Throat Denies history of Chronic sinus problems/congestion, Middle ear problems Hematologic/Lymphatic Denies history of Anemia, Hemophilia, Human Immunodeficiency Virus, Lymphedema, Sickle Cell Disease Respiratory Patient has history of Sleep Apnea - does not tolerate cpap Denies history of Aspiration, Asthma, Chronic Obstructive Pulmonary Disease (COPD), Pneumothorax, Tuberculosis Cardiovascular Patient has history of Hypertension - lisinopril HCTZ Denies history of Angina, Arrhythmia, Congestive Heart Failure, Coronary Artery Disease, Deep Vein Thrombosis, Hypotension, Myocardial Infarction, Peripheral Arterial Disease, Peripheral Venous Disease, Phlebitis, Vasculitis Gastrointestinal Denies history of Cirrhosis , Colitis, Crohnoos, Hepatitis [name], Hepatitis B, Hepatitis C Endocrine Denies history of Type I Diabetes, Type II Diabetes Genitourinary Denies history of End Stage Renal Disease Immunological Denies history of Lupus Erythematosus, Raynaudoos, Scleroderma Integumentary (Skin) Denies history of History of Burn Musculoskeletal Denies history of Gout, Rheumatoid Arthritis, Osteoarthritis,  Osteomyelitis Neurologic Patient has history of Paraplegia Denies history of Dementia, Neuropathy, Quadriplegia, Seizure Disorder Oncologic Denies history of Received Chemotherapy, Received Radiation Psychiatric Denies history of Anorexia/bulimia, Confinement Anxiety Hospitalization/Surgery History - cellulitis in leg. - left leg vein ablation. Objective Constitutional No acute distress.. Vitals Time Taken: 8:54 AM, Height: 70 in, Weight: 216 lbs, BMI: 31, Temperature: 98.9 F, Pulse: 128 bpm, Respiratory Rate: 16 breaths/min, Blood Pressure: 124/75 mmHg. Respiratory Normal work of breathing on room air.. General Notes: 06/15/2022: The right ankle wound has reopened and the fat layer is exposed. Both dorsal feet have just small openings with just [name] little bit of slough and eschar accumulation. The wound on his left gluteus and ischium is larger again today and has [name] foul odor. Integumentary (Hair, Skin) Wound #41R status is Open. Original cause of wound was Gradually Appeared. The date acquired was: 03/16/2020. The wound has been in treatment 117 weeks. The wound is located on the Left Ischium. The wound measures 0.6cm length x 1cm width x 0.1cm depth; 0.471cm^2 area and 0.047cm^3 volume. There is Fat Layer (Subcutaneous Tissue) exposed. There is no tunneling or undermining noted. There is [name] medium amount of serosanguineous drainage noted. The wound margin is distinct with the outline attached to the wound base. There is medium (34-66%) red, pink granulation within the wound bed. There is [name] medium (34-66%) amount of necrotic tissue within the wound bed including Adherent Slough. Wound #52 status is Open. Original cause of wound was Gradually Appeared. The date acquired was: 03/27/2021. The wound has been in treatment 63 weeks. The wound is located on the Right,Dorsal Foot. The wound measures 2.4cm length x 3.6cm width x 0.1cm depth; 6.786cm^2 area and 0.679cm^3 volume. There is Fat Layer  (Subcutaneous Tissue) exposed. There is no tunneling or undermining noted. There is [name] small amount of serous drainage noted. The wound margin is flat and intact. There is medium (34-66%) red granulation within the wound bed. There is [name] medium (34-66%) amount of necrotic tissue within the wound bed including Adherent Slough. Wound #56 status is Open. Original cause of wound was Gradually Appeared. The date acquired was: 07/11/2021. The wound has been in treatment 47 weeks. The wound is located on the Left,Dorsal Foot. The wound measures 0.6cm length x 0.3cm width x 0.1cm depth; 0.141cm^2 area and 0.014cm^3 volume. There is Fat Layer (Subcutaneous Tissue) exposed. There is no tunneling or undermining noted. There is [name] small amount of serous drainage noted. The wound margin is flat and intact. There is large (67-100%) red, pink granulation within the wound bed.  There is no necrotic tissue within the wound bed. Wound #64 status is Healed - Epithelialized. Original cause of wound was Gradually Appeared. The date acquired was: 05/11/2022. The wound has been in treatment 5 weeks. The wound is located on the [place]. The wound measures 0cm length x 0cm width x 0cm depth; 0cm^2 area and 0cm^3 volume. There is no tunneling or undermining noted. There is [name] none present amount of drainage noted. There is no granulation within the wound bed. There is no necrotic tissue within the wound bed. Wound #65 status is Open. Original cause of wound was Pressure Injury. The date acquired was: 05/24/2022. The wound has been in treatment 2 weeks. The wound is located on the Left Upper Leg. The wound measures 6.6cm length x 1.1cm width x 1.1cm depth; 5.702cm^2 area and 6.272cm^3 volume. There is Fat Layer (Subcutaneous Tissue) exposed. There is no tunneling or undermining noted. There is [name] large amount of serosanguineous drainage noted. The wound margin is flat and intact. There is medium (34-66%) red, pink granulation  within the wound bed. There is [name] medium (34-66%) amount of necrotic tissue within the wound bed including Adherent Slough. Wound #66 status is Open. Original cause of wound was Pressure Injury. The date acquired was: 06/15/2022. The wound is located on the Right,Anterior Ankle. The wound measures 1.8cm length x 0.9cm width x 0.1cm depth; 1.272cm^2 area and 0.127cm^3 volume. There is no tunneling or undermining noted. There is [name] small amount of serous drainage noted. The wound margin is flat and intact. There is small (1-33%) red granulation within the wound bed. There is [name] large (67- 100%) amount of necrotic tissue within the wound bed including Eschar and Adherent Slough. 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 There was [name] Excisional Skin/Subcutaneous Tissue Debridement with [name] total area of 0.6 sq cm performed by Fredirick Maudlin, MD. With the following instrument(s): Curette to remove Viable and Non-Viable tissue/material. Material removed includes Eschar, Subcutaneous Tissue, and Slough. [name] time out was conducted at 09:38, prior to the start of the procedure. [name] Minimum amount of bleeding was controlled with Pressure. The procedure was tolerated well. Post Debridement Measurements: 0.6cm length x 1cm width x 0.1cm depth; 0.047cm^3 volume. Post debridement Stage noted as Category/Stage II. Character of Wound/Ulcer Post Debridement  is stable. Post procedure Diagnosis Wound #41R: Same as Pre-Procedure Wound #52 Pre-procedure diagnosis of Wound #52 is [name] Trauma, Other located on the Right,Dorsal Foot . There was [name] Excisional Skin/Subcutaneous Tissue Debridement with [name] total area of 8.64 sq cm performed by Fredirick Maudlin, MD. With the following instrument(s): Curette to remove Viable and Non-Viable tissue/material. Material removed includes Eschar, Subcutaneous Tissue, and Slough. No specimens were taken. [name] time out was conducted at 09:38, prior to the start of the procedure. [name] Minimum amount of bleeding was controlled with Pressure. The procedure was tolerated well. Post Debridement Measurements: 2.4cm length x 3.6cm width x 0.1cm depth; 0.679cm^3 volume. Character of Wound/Ulcer Post Debridement is improved. Post  procedure Diagnosis Wound #52: Same as Pre-Procedure Wound #65 Pre-procedure diagnosis of Wound #65 is [name] Pressure Ulcer located on the Left Upper Leg . There was [name] Excisional Skin/Subcutaneous Tissue Debridement with [name] total area of 7.26 sq cm performed by Fredirick Maudlin, MD. With the following instrument(s): Curette to remove Viable and Non-Viable tissue/material. Material removed includes Eschar, Subcutaneous Tissue, and Slough. 1 specimen was taken by [name] Tissue Culture and sent to the lab per facility protocol. [name] time out was conducted at 09:38, prior to the start of the procedure. [name] Minimum amount of bleeding was controlled with Pressure. The procedure was tolerated well. Post Debridement Measurements: 6.6cm length x 1.1cm width x 0.1cm depth; 0.57cm^3 volume. Post debridement Stage noted as Category/Stage III. Character of Wound/Ulcer Post Debridement is stable. Post procedure Diagnosis Wound #65: Same as Pre-Procedure Wound #66 Pre-procedure diagnosis of Wound #66 is [name] Venous Leg Ulcer located on the Right,Anterior Ankle .Severity of Tissue Pre Debridement is: Fat layer exposed. There was [name] Selective/Open  Wound Non-Viable Tissue Debridement with [name] total area of 1.62 sq cm performed by Fredirick Maudlin, MD. With the following instrument(s): Curette to remove Non-Viable tissue/material. Material removed includes Eschar and Slough and. [name] time out was conducted at 09:38, prior to the start of the procedure. [name] Minimum amount of bleeding was controlled with Pressure. The procedure was tolerated well. Post Debridement Measurements: 1.8cm length x 0.9cm width x 0.1cm depth; 0.127cm^3 volume. Character of Wound/Ulcer Post Debridement is improved. Severity of Tissue Post Debridement is: Fat layer exposed. Post procedure Diagnosis Wound #66: Same as Pre-Procedure Plan Follow-up Appointments: Return Appointment in 2 weeks. - Dr Celine Ahr - Room 1 Tuesday 06/29/2022 @ 8:15pm Bathing/ Shower/ Hygiene: May shower and wash wound with soap and water. - on days that dressing is changed Edema Control - Lymphedema / SCD / Other: Elevate legs to the level of the heart or above for 30 minutes daily and/or when sitting, [name] frequency of: - throughout the day Moisturize legs daily. - using Aquaphor generously to both legs and feet with dressing changes Compression stocking or Garment 30-40 mm/Hg pressure to: - Juxtalite to both legs daily Off-Loading: Roho cushion for wheelchair Turn and reposition every 2 hours Laboratory ordered were: Aerobic culture-specimen not specified - left upper leg The following medication(s) was prescribed: gentamicin topical 0.1 % ointment Apply to ischial wound with dressing changes starting 06/15/2022 WOUND #41R: - Ischium Wound Laterality: Left Cleanser: Soap and Water Every Other Day/30 Days Discharge Instructions: May shower and wash wound with dial antibacterial soap and water prior to dressing change. Peri-Wound Care: Zinc Oxide Ointment 30g tube Every Other Day/30 Days Discharge Instructions: Apply Zinc Oxide to periwound with each dressing change Topical: Gentamicin Every Other  Day/30 Days Discharge Instructions: As directed by physician Prim Dressing: KerraCel Ag Gelling Fiber Dressing, 4x5 in (silver alginate) (Generic) Every Other Day/30 Days ary Discharge Instructions: Apply silver alginate to wound bed as instructed Secondary Dressing: ALLEVYN Gentle Border, 4x4 (in/in) Every Other Day/30 Days Discharge Instructions: Apply over primary dressing as directed. WOUND #52: - Foot Wound Laterality: Dorsal, Right Cleanser: Soap and Water Every Other Day/30 Days Discharge Instructions: May shower and wash wound with dial antibacterial soap and water prior to dressing change. Peri-Wound Care: Triamcinolone 15 (g) Every Other Day/30 Days Discharge Instructions: Use triamcinolone 15 (g) as directed Peri-Wound Care: Sween Lotion (Moisturizing lotion) Every Other Day/30 Days Discharge Instructions: Apply Aquaphor moisturizing lotion as directed Peri-Wound Care: Lotrisone Every Other Day/30 Days Discharge  Instructions: Apply Lotrisone to periwound Prim Dressing: KerraCel Ag Gelling Fiber Dressing, 2x2 in (silver alginate) Every Other Day/30 Days ary Discharge Instructions: Apply silver alginate to wound bed as instructed Secondary Dressing: Woven Gauze Sponge, Non-Sterile 4x4 in Every Other Day/30 Days Discharge Instructions: Apply over primary dressing as directed. Secured With: [employer], 4.5x3.1 (in/yd) (Generic) Every Other Day/30 Days Discharge Instructions: Secure with Kerlix as directed. Secured With: 75M Medipore H Soft Cloth Surgical T ape, 4 x 10 (in/yd) (Generic) Every Other Day/30 Days Discharge Instructions: Secure with tape as directed. WOUND #56: - Foot Wound Laterality: Dorsal, Left Cleanser: Soap and Water Every Other Day/30 Days Discharge Instructions: May shower and wash wound with dial antibacterial soap and water prior to dressing change. Peri-Wound Care: Triamcinolone 15 (g) Every Other Day/30 Days Discharge Instructions: Use  triamcinolone 15 (g) as directed Peri-Wound Care: Sween Lotion (Moisturizing lotion) Every Other Day/30 Days Discharge Instructions: Apply Aquaphor moisturizing lotion as directed Peri-Wound Care: Lotrisone Every Other Day/30 Days Discharge Instructions: Apply Lotrisone to periwound Prim Dressing: KerraCel Ag Gelling Fiber Dressing, 2x2 in (silver alginate) Every Other Day/30 Days ary Discharge Instructions: Apply silver alginate to wound bed as instructed Secondary Dressing: Woven Gauze Sponge, Non-Sterile 4x4 in Every Other Day/30 Days Discharge Instructions: Apply over primary dressing as directed. Secured With: [employer], 4.5x3.1 (in/yd) (Generic) Every Other Day/30 Days Discharge Instructions: Secure with Kerlix as directed. Secured With: 75M Medipore H Soft Cloth Surgical T ape, 4 x 10 (in/yd) (Generic) Every Other Day/30 Days Discharge Instructions: Secure with tape as directed. WOUND #65: - Upper Leg Wound Laterality: Left Cleanser: Soap and Water Every Other Day/30 Days Discharge Instructions: May shower and wash wound with dial antibacterial soap and water prior to dressing change. Peri-Wound Care: Zinc Oxide Ointment 30g tube Every Other Day/30 Days Discharge Instructions: Apply Zinc Oxide to periwound with each dressing change Topical: Gentamicin Every Other Day/30 Days Discharge Instructions: As directed by physician Prim Dressing: KerraCel Ag Gelling Fiber Dressing, 4x5 in (silver alginate) (Generic) Every Other Day/30 Days ary Discharge Instructions: Apply silver alginate to wound bed as instructed Secondary Dressing: ALLEVYN Gentle Border, 4x4 (in/in) Every Other Day/30 Days Discharge Instructions: Apply over primary dressing as directed. 06/15/2022: The right ankle wound has reopened and the fat layer is exposed. Both dorsal feet have just small openings with just [name] little bit of slough and eschar accumulation. The wound on his left gluteus and ischium is larger  again today and has [name] foul odor. I debrided eschar and nonviable subcutaneous tissue from the right ankle wound, eschar and slough from the dorsal foot wounds. I debrided slough and nonviable subcutaneous tissue from the gluteal/ischial wound. Due to the odor, I also took [name] culture. I do not think he needs systemic antibiotics right now but we will apply gentamicin to the site under the silver alginate. Once the culture data are available, we may make adjustments in his therapy. We will continue silver alginate to the other wound sites. Follow-up in 2 weeks. Electronic Signature(s) Signed: 06/15/2022 10:07:06 AM By: Fredirick Maudlin MD FACS Entered By: Fredirick Maudlin on 06/15/2022 10:07:05 -------------------------------------------------------------------------------- HxROS Details Patient Name: Date of Service: Pressnell, [name] [name] 06/15/2022 8:15 [name] [name] Medical Record Number: [id number] Patient Account Number: [id number] Date of Birth/Sex: Treating RN: 12-Jun-1986 (36 y.o. [name] [name] Primary Care Provider: [place], [place] Other Clinician: Referring Provider: Treating Provider/Extender: Cornelia Copa [name]: 336 Information Obtained From Patient Ear/Nose/Mouth/Throat Medical History: Negative for:  Chronic sinus problems/congestion; Middle ear problems Hematologic/Lymphatic Medical History: Negative for: Anemia; Hemophilia; Human Immunodeficiency Virus; Lymphedema; Sickle Cell Disease Respiratory Medical History: Positive for: Sleep Apnea - does not tolerate cpap Negative for: Aspiration; Asthma; Chronic Obstructive Pulmonary Disease (COPD); Pneumothorax; Tuberculosis Cardiovascular Medical History: Positive for: Hypertension - lisinopril HCTZ Negative for: Angina; Arrhythmia; Congestive Heart Failure; Coronary Artery Disease; Deep Vein Thrombosis; Hypotension; Myocardial Infarction; Peripheral Arterial Disease; Peripheral Venous Disease; Phlebitis;  Vasculitis Gastrointestinal Medical History: Negative for: Cirrhosis ; Colitis; Crohns; Hepatitis [name]; Hepatitis B; Hepatitis C Endocrine Medical History: Negative for: Type I Diabetes; Type II Diabetes Genitourinary Medical History: Negative for: End Stage Renal Disease Immunological Medical History: Negative for: Lupus Erythematosus; Raynauds; Scleroderma Integumentary (Skin) Medical History: Negative for: History of Burn Musculoskeletal Medical History: Negative for: Gout; Rheumatoid Arthritis; Osteoarthritis; Osteomyelitis Neurologic Medical History: Positive for: Paraplegia Negative for: Dementia; Neuropathy; Quadriplegia; Seizure Disorder Oncologic Medical History: Negative for: Received Chemotherapy; Received Radiation Psychiatric Medical History: Negative for: Anorexia/bulimia; Confinement Anxiety Immunizations Pneumococcal Vaccine: Received Pneumococcal Vaccination: No Immunization Notes: doesn't remember when last tetanus shot was Implantable Devices No devices added Hospitalization / Surgery History Type of Hospitalization/Surgery cellulitis in leg left leg vein ablation Family and Social History Cancer: No; Diabetes: Yes - Father; Heart Disease: No; Hereditary Spherocytosis: No; Hypertension: Yes - Mother; Kidney Disease: No; Lung Disease: No; Stroke: No; Thyroid Problems: No; Tuberculosis: No; Former smoker; Marital Status - Married; Alcohol Use: Moderate; Drug Use: No History; Caffeine Use: Daily; Financial Concerns: No; Food, Clothing or Shelter Needs: No; Support System Lacking: No; Transportation Concerns: No Electronic Signature(s) Signed: 06/15/2022 10:20:13 AM By: Fredirick Maudlin MD FACS Signed: 06/15/2022 4:42:49 PM By: Baruch Gouty RN, BSN Entered By: Fredirick Maudlin on 06/15/2022 10:03:24 -------------------------------------------------------------------------------- SuperBill Details Patient Name: Date of Service: [place], [name] [name]  06/15/2022 Medical Record Number: [id number] Patient Account Number: [id number] Date of Birth/Sex: Treating RN: March 10, 1986 (36 y.o. [name] [name] Primary Care Provider: [place], [place] Other Clinician: Referring Provider: Treating Provider/Extender: Graciela Husbands, [name]: 336 Diagnosis Coding ICD-10 Codes Code Description I87.332 Chronic venous hypertension (idiopathic) with ulcer and inflammation of left lower extremity L97.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, 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 with other 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, 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 Non-pressure chronic ulcer of other part of left foot limited to breakdown of skin L97.312 Non-pressure chronic ulcer of right 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 Facility Procedures CPT4 Code: [id number] Description: 11042 - DEB SUBQ TISSUE 20 SQ CM/< ICD-10 Diagnosis Description 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, stage 3 Modifier: Quantity: 1 CPT4 Code: [id number] Description: 02233 - DEBRIDE WOUND 1ST 20 SQ CM OR < ICD-10 Diagnosis Description L97.511 Non-pressure chronic ulcer of other part of right foot limited to breakdown of s L97.521 Non-pressure chronic ulcer of other part of left foot limited to breakdown  of sk Modifier: kin in Quantity: 1 Physician Procedures : CPT4 Code Description Modifier [id number] - WC PHYS LEVEL 4 - EST PT ICD-10 Diagnosis Description L97.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, stage 3 L97.518  Non-pressure chronic ulcer of other part of right foot with other specified severity L97.312 Non-pressure chronic ulcer of right 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 Quantity: 1 : [id number] -  WC  PHYS SUBQ TISS 20 SQ CM ICD-10 Diagnosis Description M18.485 Non-pressure chronic ulcer of right 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 L89.323 Pressure ulcer of left buttock, stage 3 [id number] - WC PHYS DEBR WO ANESTH 20 SQ CM 1 ICD-10  Diagnosis Description L97.511 Non-pressure chronic ulcer of other part of right foot limited to breakdown of skin L97.521 Non-pressure chronic ulcer of other part of left foot limited to breakdown of skin Quantity: 1 Electronic Signature(s) Signed: 06/15/2022 10:08:05 AM By: Fredirick Maudlin MD FACS Entered By: Fredirick Maudlin on 06/15/2022 10:08:05

## 2022-06-15 NOTE — Progress Notes (Signed)
Brix, FAIN FRANCIS ([id number]) Visit Report for 06/15/2022 Arrival Information Details Patient Name: Date of Service: Batrez, [name] 06/15/2022 8:15 A M Medical Record Number: [id number] Patient Account Number: [id number] Date of Birth/Sex: Treating RN: 1986-02-04 (36 y.o. [name] Primary Care Abbegail Matuska: [place], [place] Other Clinician: Referring Exilda Wilhite: Treating Montoya Watkin/Extender: Cornelia Copa [name]: 27 Visit Information History Since Last Visit All ordered tests and consults were completed: Yes Patient Arrived: Wheel Chair Added or deleted any medications: No Arrival Time: 08:51 Any new allergies or adverse reactions: No Accompanied By: self Had a fall or experienced change in No Transfer Assistance: None activities of daily living that may affect Patient Identification Verified: Yes risk of falls: Secondary Verification Process Completed: Yes Signs or symptoms of abuse/neglect since last visito No Patient Requires Transmission-Based Precautions: No Hospitalized since last visit: No Patient Has Alerts: Yes Implantable device outside of the clinic excluding No Patient Alerts: R ABI = 1.0 cellular tissue based products placed in the center L ABI = 1.1 since last visit: Pain Present Now: No Electronic Signature(s) Signed: 06/15/2022 11:46:15 AM By: Blanche East RN Entered By: Blanche East on 06/15/2022 08:54:49 -------------------------------------------------------------------------------- Encounter Discharge Information Details Patient Name: Date of Service: [place], [name] 06/15/2022 8:15 A M Medical Record Number: [id number] Patient Account Number: [id number] Date of Birth/Sex: Treating RN: [birth year] (36 y.o. [name] Primary Care Josiah Wojtaszek: [place], [place] Other Clinician: Referring Germaine Ripp: Treating Taysen Bushart/Extender: Cornelia Copa [name]: 336 Encounter Discharge Information Items Post  Procedure Vitals Discharge Condition: Stable Temperature (F): 98.9 Ambulatory Status: Wheelchair Pulse (bpm): 128 Discharge Destination: Home Respiratory Rate (breaths/min): 18 Transportation: Private Auto Blood Pressure (mmHg): 124/75 Accompanied By: self Schedule Follow-up Appointment: Yes Clinical Summary of Care: Patient Declined Electronic Signature(s) Signed: 06/15/2022 4:42:49 PM By: Baruch Gouty RN, BSN Entered By: Baruch Gouty on 06/15/2022 12:13:34 -------------------------------------------------------------------------------- Lower Extremity Assessment Details Patient Name: Date of Service: [name], [name] 06/15/2022 8:15 A M Medical Record Number: [id number] Patient Account Number: [id number] Date of Birth/Sex: Treating RN: Aug 11, 1986 (36 y.o. [name] Primary Care Lorrin Nawrot: [place], [place] Other Clinician: Referring Herma Uballe: Treating Axel Meas/Extender: Graciela Husbands, [name]: 336 Edema Assessment Assessed: [Left: No] [Right: No] Edema: [Left: Yes] [Right: Yes] Calf Left: Right: Point of Measurement: 33 cm From Medial Instep 30.4 cm 36.2 cm Ankle Left: Right: Point of Measurement: 10 cm From Medial Instep 26 cm 24.4 cm Vascular Assessment Pulses: Dorsalis Pedis Palpable: [Left:Yes] [Right:Yes] Electronic Signature(s) Signed: 06/15/2022 11:46:15 AM By: Blanche East RN Signed: 06/15/2022 4:42:49 PM By: Baruch Gouty RN, BSN Entered By: Blanche East on 06/15/2022 09:05:01 -------------------------------------------------------------------------------- Multi Wound Chart Details Patient Name: Date of Service: [place], [name] 06/15/2022 8:15 A M Medical Record Number: [id number] Patient Account Number: [id number] Date of Birth/Sex: Treating RN: 09-07-1986 (36 y.o. [name] Primary Care Novalyn Lajara: [name], [name] Other Clinician: Referring Prestyn Mahn: Treating Celie Desrochers/Extender: Graciela Husbands,  [name]: 336 Vital Signs Height(in): 70 Pulse(bpm): 128 Weight(lbs): 216 Blood Pressure(mmHg): 124/75 Body Mass Index(BMI): 31 Temperature(F): 98.9 Respiratory Rate(breaths/min): 16 Photos: [41R:Left Ischium] [52:Right, Dorsal Foot] [56:Left, Dorsal Foot] Wound Location: [41R:Gradually Appeared] [52:Gradually Appeared] [56:Gradually Appeared] Wounding Event: [41R:Pressure Ulcer] [52:Trauma, Other] [56:Neuropathic Ulcer-Non Diabetic] Primary Etiology: [41R:Sleep Apnea, Hypertension, Paraplegia Sleep Apnea, Hypertension, Paraplegia Sleep Apnea, Hypertension, Paraplegia] Comorbid History: [41R:03/16/2020] [52:03/27/2021] [56:07/11/2021] Date Acquired: [41R:117] [52:63] [56:47] Weeks of Treatment: [41R:Open] [52:Open] [56:Open] Wound Status: [41R:Yes] [52:No] [56:No] Wound Recurrence: [41R:Yes] [52:No] [56:Yes] Clustered Wound: [  41R:3] [52:N/A] [56:N/A] Clustered Quantity: [41R:0.6x1x0.1] [52:2.4x3.6x0.1] [56:0.6x0.3x0.1] Measurements L x W x D (cm) [41R:0.471] [52:6.786] [56:0.141] A (cm) : rea [41R:0.047] [52:0.679] [56:0.014] Volume (cm) : [41R:73.90%] [52:-260.00%] [56:97.70%] % Reduction in A rea: [41R:74.00%] [52:-261.20%] [56:97.70%] % Reduction in Volume: [41R:Category/Stage II] [52:Full Thickness Without Exposed] [56:Full Thickness Without Exposed] Classification: [41R:Medium] [52:Support Structures Small] [56:Support Structures Small] Exudate A mount: [41R:Serosanguineous] [52:Serous] [56:Serous] Exudate Type: [41R:red, brown] [52:amber] [56:amber] Exudate Color: [41R:Distinct, outline attached] [52:Flat and Intact] [56:Flat and Intact] Wound Margin: [41R:Medium (34-66%)] [52:Medium (34-66%)] [56:Large (67-100%)] Granulation A mount: [41R:Red, Pink] [52:Red] [56:Red, Pink] Granulation Quality: [41R:Medium (34-66%)] [52:Medium (34-66%)] [56:None Present (0%)] Necrotic A mount: [41R:Adherent Slough] [52:Adherent Slough] [56:N/A] Necrotic Tissue: [41R:Fat Layer  (Subcutaneous Tissue): Yes Fat Layer (Subcutaneous Tissue): Yes Fat Layer (Subcutaneous Tissue): Yes] Exposed Structures: [41R:Fascia: No Tendon: No Muscle: No Joint: No Bone: No Small (1-33%)] [52:Fascia: No Tendon: No Muscle: No Joint: No Bone: No Large (67-100%)] [56:Fascia: No Tendon: No Muscle: No Joint: No Bone: No Large (67-100%)] Epithelialization: [41R:Debridement - Excisional] [52:Debridement - Excisional] [56:N/A] Debridement: Pre-procedure Verification/Time Out 09:38 [52:09:38] [56:N/A] Taken: [41R:Necrotic/Eschar, Subcutaneous,] [52:Necrotic/Eschar, Subcutaneous,] [56:N/A] Tissue Debrided: [41R:Slough Skin/Subcutaneous Tissue] [52:Slough Skin/Subcutaneous Tissue] [56:N/A] Level: [41R:0.6] [52:8.64] [56:N/A] Debridement A (sq cm): [41R:rea Curette] [52:Curette] [56:N/A] Instrument: [41R:Minimum] [52:Minimum] [56:N/A] Bleeding: [41R:Pressure] [52:Pressure] [56:N/A] Hemostasis Achieved: Debridement Treatment Response: Procedure was tolerated well [52:Procedure was tolerated well] [56:N/A] Post Debridement Measurements L x 0.6x1x0.1 [52:2.4x3.6x0.1] [56:N/A] W x D (cm) [41R:0.047] [52:0.679] [56:N/A] Post Debridement Volume: (cm) [41R:Category/Stage II] [52:N/A] [56:N/A] Post Debridement Stage: [41R:Debridement] [52:Debridement] [56:N/A] Wound Number: [id number] Photos: Right, Plantar Foot Left Upper Leg Right, Anterior Ankle Wound Location: Gradually Appeared Pressure Injury Pressure Injury Wounding Event: Lymphedema Pressure Ulcer Venous Leg Ulcer Primary Etiology: Sleep Apnea, Hypertension, Paraplegia Sleep Apnea, Hypertension, Paraplegia Sleep Apnea, Hypertension, Paraplegia Comorbid History: 05/11/2022 05/24/2022 06/15/2022 Date Acquired: 5 2 0 Weeks of Treatment: Healed - Epithelialized Open Open Wound Status: No No No Wound Recurrence: No No No Clustered Wound: N/A N/A N/A Clustered Quantity: 0x0x0 6.6x1.1x1.1 1.8x0.9x0.1 Measurements L x W x D (cm) 0 5.702  1.272 A (cm) : rea 0 6.272 0.127 Volume (cm) : 100.00% 9.20% 0.00% % Reduction in A rea: 100.00% -898.70% 0.00% % Reduction in Volume: Full Thickness Without Exposed Category/Stage III Full Thickness Without Exposed Classification: Support Structures Support Structures None Present Large Small Exudate Amount: N/A Serosanguineous Serous Exudate Type: N/A red, brown amber Exudate Color: N/A Flat and Intact Flat and Intact Wound Margin: None Present (0%) Medium (34-66%) Small (1-33%) Granulation Amount: N/A Red, Pink Red Granulation Quality: None Present (0%) Medium (34-66%) Large (67-100%) Necrotic Amount: N/A Adherent Slough Eschar, Adherent Slough Necrotic Tissue: Fascia: No Fat Layer (Subcutaneous Tissue): Yes Fascia: No Exposed Structures: Fat Layer (Subcutaneous Tissue): No Fascia: No Fat Layer (Subcutaneous Tissue): No Tendon: No Tendon: No Tendon: No Muscle: No Muscle: No Muscle: No Joint: No Joint: No Joint: No Bone: No Bone: No Bone: No Large (67-100%) Small (1-33%) Small (1-33%) Epithelialization: N/A Debridement - Excisional Debridement - Selective/Open Wound Debridement: Pre-procedure Verification/Time Out N/A 09:38 09:38 Taken: N/A Art therapist, Subcutaneous, Necrotic/Eschar, Slough Tissue Debrided: Slough N/A Skin/Subcutaneous Tissue Non-Viable Tissue Level: N/A 7.26 1.62 Debridement A (sq cm): rea N/A Curette Curette Instrument: N/A Minimum Minimum Bleeding: N/A Pressure Pressure Hemostasis Achieved: N/A Procedure was tolerated well Procedure was tolerated well Debridement Treatment Response: N/A 6.6x1.1x0.1 1.8x0.9x0.1 Post Debridement Measurements L x W x D (cm) N/A 0.57 0.127 Post Debridement Volume: (cm) N/A Category/Stage III N/A Post Debridement  Stage: N/A Debridement Debridement Procedures Performed: Treatment Notes Electronic Signature(s) Signed: 06/15/2022 10:01:22 AM By: Fredirick Maudlin MD FACS Signed:  06/15/2022 4:42:49 PM By: Baruch Gouty RN, BSN Entered By: Fredirick Maudlin on 06/15/2022 10:01:22 -------------------------------------------------------------------------------- [place] Details Patient Name: Date of Service: [place], [name] 06/15/2022 8:15 A M Medical Record Number: [id number] Patient Account Number: [id number] Date of Birth/Sex: Treating RN: 23-Dec-1985 (36 y.o. [name] Primary Care Caius Silbernagel: [name], [name] Other Clinician: Referring Tangy Drozdowski: Treating Ladarion Munyon/Extender: Cornelia Copa [name]: [place] reviewed with physician Active Inactive Pressure Nursing Diagnoses: Knowledge deficit related to management of pressures ulcers Potential for impaired tissue integrity related to pressure, friction, moisture, and shear Goals: Patient will remain free from development of additional pressure ulcers Date Initiated: 06/21/2018 Date Inactivated: 02/12/2019 Target Resolution Date: 02/16/2019 Unmet Reason: pressure ulcer Goal Status: Unmet reopened on left ischium Patient/caregiver will verbalize risk factors for pressure ulcer development Date Initiated: 06/21/2018 Date Inactivated: 05/28/2019 Target Resolution Date: 06/01/2019 Goal Status: Met Patient/caregiver will verbalize understanding of pressure ulcer management Date Initiated: 06/01/2022 Target Resolution Date: 06/29/2022 Goal Status: Active Interventions: Assess: immobility, friction, shearing, incontinence upon admission and as needed Assess offloading mechanisms upon admission and as needed Assess potential for pressure ulcer upon admission and as needed Treatment Activities: Pressure reduction/relief device ordered : 12/05/2017 Notes: Wound/Skin Impairment Nursing Diagnoses: Impaired tissue integrity Knowledge deficit related to ulceration/compromised skin integrity Goals: Patient/caregiver will verbalize understanding of  skin care regimen Date Initiated: 06/01/2022 Target Resolution Date: 06/29/2022 Goal Status: Active Ulcer/skin breakdown will have a volume reduction of 30% by week 4 Date Initiated: 06/01/2022 Target Resolution Date: 06/29/2022 Goal Status: Active Interventions: Assess patient/caregiver ability to obtain necessary supplies Assess ulceration(s) every visit Provide education on ulcer and skin care Notes: 02/02/21: Complex Care, ongoing. Electronic Signature(s) Signed: 06/15/2022 11:46:15 AM By: Blanche East RN Signed: 06/15/2022 4:42:49 PM By: Baruch Gouty RN, BSN Entered By: Blanche East on 06/15/2022 09:31:23 -------------------------------------------------------------------------------- Pain Assessment Details Patient Name: Date of Service: [place], [name] 06/15/2022 8:15 A M Medical Record Number: [id number] Patient Account Number: [id number] Date of Birth/Sex: Treating RN: 22-Oct-1986 (36 y.o. [name] Primary Care Rosamary Boudreau: [place] Other Clinician: Referring Carlette Palmatier: Treating Roland Lipke/Extender: Cornelia Copa [name]: 336 Active Problems Location of Pain Severity and Description of Pain Patient Has Paino No Site Locations With Dressing Change: No Rate the pain. Current Pain Level: 0 Pain Management and Medication Current Pain Management: Electronic Signature(s) Signed: 06/15/2022 11:46:15 AM By: Blanche East RN Signed: 06/15/2022 4:42:49 PM By: Baruch Gouty RN, BSN Entered By: Blanche East on 06/15/2022 08:59:25 -------------------------------------------------------------------------------- Patient/Caregiver Education Details Patient Name: Date of Service: [place], [name] 7/25/2023andnbsp8:15 A M Medical Record Number: [id number] Patient Account Number: [id number] Date of Birth/Gender: Treating RN: Jun 13, 1986 (36 y.o. [name] Primary Care Physician: Janine Limbo Other Clinician: Referring  Physician: Treating Physician/Extender: Cornelia Copa [name]: [place] Education Assessment Education Provided To: Patient Education Topics Provided Pressure: Methods: Explain/Verbal Responses: Reinforcements needed, State content correctly Wound/Skin Impairment: Methods: Explain/Verbal Responses: Reinforcements needed, State content correctly Electronic Signature(s) Signed: 06/15/2022 11:46:15 AM By: Blanche East RN Entered By: Blanche East on 06/15/2022 09:32:35 -------------------------------------------------------------------------------- Wound Assessment Details Patient Name: Date of Service: [place], [name] 06/15/2022 8:15 A M Medical Record Number: [id number] Patient Account Number: [id number] Date of Birth/Sex: Treating RN: 02-13-1986 (36 y.o. [name] Primary Care Kambree Krauss: [name], [name] Other Clinician: Referring Belmont Valli: Treating  Mivaan Corbitt/Extender: Fredirick Maudlin [name], [name]: 336 Wound Status Wound Number: [id number] Primary Etiology: Pressure Ulcer Wound Location: Left Ischium Wound Status: Open Wounding Event: Gradually Appeared Comorbid History: Sleep Apnea, Hypertension, Paraplegia Date Acquired: 03/16/2020 Weeks Of Treatment: 117 Clustered Wound: Yes Photos Wound Measurements Length: (cm) 0.6 Width: (cm) 1 Depth: (cm) 0.1 Clustered Quantity: 3 Area: (cm) 0.471 Volume: (cm) 0.047 % Reduction in Area: 73.9% % Reduction in Volume: 74% Epithelialization: Small (1-33%) Tunneling: No Undermining: No Wound Description Classification: Category/Stage II Wound Margin: Distinct, outline attached Exudate Amount: Medium Exudate Type: Serosanguineous Exudate Color: red, brown Foul Odor After Cleansing: No Slough/Fibrino Yes Wound Bed Granulation Amount: Medium (34-66%) Exposed Structure Granulation Quality: Red, Pink Fascia Exposed: No Necrotic Amount: Medium (34-66%) Fat Layer (Subcutaneous  Tissue) Exposed: Yes Necrotic Quality: Adherent Slough Tendon Exposed: No Muscle Exposed: No Joint Exposed: No Bone Exposed: No Treatment Notes Wound #[id number] (Ischium) Wound Laterality: Left Cleanser Soap and Water Discharge Instruction: May shower and wash wound with dial antibacterial soap and water prior to dressing change. Peri-Wound Care Zinc Oxide Ointment 30g tube Discharge Instruction: Apply Zinc Oxide to periwound with each dressing change Topical Gentamicin Discharge Instruction: As directed by physician Primary Dressing KerraCel Ag Gelling Fiber Dressing, 4x5 in (silver alginate) Discharge Instruction: Apply silver alginate to wound bed as instructed Secondary Dressing ALLEVYN Gentle Border, 4x4 (in/in) Discharge Instruction: Apply over primary dressing as directed. Secured With Compression Wrap Compression Stockings Environmental education officer) Signed: 06/15/2022 4:42:49 PM By: Baruch Gouty RN, BSN Signed: 06/15/2022 4:54:16 PM By: Dellie Catholic RN Entered By: Dellie Catholic on 06/15/2022 09:34:09 -------------------------------------------------------------------------------- Wound Assessment Details Patient Name: Date of Service: [place], [name] 06/15/2022 8:15 A M Medical Record Number: [id number] Patient Account Number: [id number] Date of Birth/Sex: Treating RN: [birth year] (36 y.o. [name] Primary Care Fergie Sherbert: [name], [name] Other Clinician: Referring Rayhana Slider: Treating Alylah Blakney/Extender: Graciela Husbands, [name]: 336 Wound Status Wound Number: 52 Primary Etiology: Trauma, Other Wound Location: Right, Dorsal Foot Wound Status: Open Wounding Event: Gradually Appeared Comorbid History: Sleep Apnea, Hypertension, Paraplegia Date Acquired: 03/27/2021 Weeks Of Treatment: 63 Clustered Wound: No Photos Wound Measurements Length: (cm) 2.4 Width: (cm) 3.6 Depth: (cm) 0.1 Area: (cm) 6.786 Volume: (cm) 0.679 %  Reduction in Area: -260% % Reduction in Volume: -261.2% Epithelialization: Large (67-100%) Tunneling: No Undermining: No Wound Description Classification: Full Thickness Without Exposed Support Structures Wound Margin: Flat and Intact Exudate Amount: Small Exudate Type: Serous Exudate Color: amber Foul Odor After Cleansing: No Slough/Fibrino No Wound Bed Granulation Amount: Medium (34-66%) Exposed Structure Granulation Quality: Red Fascia Exposed: No Necrotic Amount: Medium (34-66%) Fat Layer (Subcutaneous Tissue) Exposed: Yes Necrotic Quality: Adherent Slough Tendon Exposed: No Muscle Exposed: No Joint Exposed: No Bone Exposed: No Treatment Notes Wound #52 (Foot) Wound Laterality: Dorsal, Right Cleanser Soap and Water Discharge Instruction: May shower and wash wound with dial antibacterial soap and water prior to dressing change. Peri-Wound Care Triamcinolone 15 (g) Discharge Instruction: Use triamcinolone 15 (g) as directed Sween Lotion (Moisturizing lotion) Discharge Instruction: Apply Aquaphor moisturizing lotion as directed Lotrisone Discharge Instruction: Apply Lotrisone to periwound Topical Primary Dressing KerraCel Ag Gelling Fiber Dressing, 2x2 in (silver alginate) Discharge Instruction: Apply silver alginate to wound bed as instructed Secondary Dressing Woven Gauze Sponge, Non-Sterile 4x4 in Discharge Instruction: Apply over primary dressing as directed. Secured With [employer], 4.5x3.1 (in/yd) Discharge Instruction: Secure with Kerlix as directed. 57M Medipore H Soft Cloth Surgical T ape, 4 x 10 (in/yd)  Discharge Instruction: Secure with tape as directed. Compression Wrap Compression Stockings Add-Ons Electronic Signature(s) Signed: 06/15/2022 4:42:49 PM By: Baruch Gouty RN, BSN Signed: 06/15/2022 4:54:16 PM By: Dellie Catholic RN Entered By: Dellie Catholic on 06/15/2022  09:18:59 -------------------------------------------------------------------------------- Wound Assessment Details Patient Name: Date of Service: [place]ie, [name] 06/15/2022 8:15 A M Medical Record Number: [id number] Patient Account Number: [id number] Date of Birth/Sex: Treating RN: Dec 11, 1985 (36 y.o. [name] Primary Care Jennalyn Cawley: [name], [name] Other Clinician: Referring Dao Mearns: Treating Temprence Rhines/Extender: Graciela Husbands, [name]: 336 Wound Status Wound Number: 56 Primary Etiology: Neuropathic Ulcer-Non Diabetic Wound Location: Left, Dorsal Foot Wound Status: Open Wounding Event: Gradually Appeared Comorbid History: Sleep Apnea, Hypertension, Paraplegia Date Acquired: 07/11/2021 Weeks Of Treatment: 47 Clustered Wound: Yes Photos Wound Measurements Length: (cm) 0.6 Width: (cm) 0.3 Depth: (cm) 0.1 Area: (cm) 0.141 Volume: (cm) 0.014 % Reduction in Area: 97.7% % Reduction in Volume: 97.7% Epithelialization: Large (67-100%) Tunneling: No Undermining: No Wound Description Classification: Full Thickness Without Exposed Support Structu Wound Margin: Flat and Intact Exudate Amount: Small Exudate Type: Serous Exudate Color: amber res Foul Odor After Cleansing: No Slough/Fibrino Yes Wound Bed Granulation Amount: Large (67-100%) Exposed Structure Granulation Quality: Red, Pink Fascia Exposed: No Necrotic Amount: None Present (0%) Fat Layer (Subcutaneous Tissue) Exposed: Yes Tendon Exposed: No Muscle Exposed: No Joint Exposed: No Bone Exposed: No Treatment Notes Wound #56 (Foot) Wound Laterality: Dorsal, Left Cleanser Soap and Water Discharge Instruction: May shower and wash wound with dial antibacterial soap and water prior to dressing change. Peri-Wound Care Triamcinolone 15 (g) Discharge Instruction: Use triamcinolone 15 (g) as directed Sween Lotion (Moisturizing lotion) Discharge Instruction: Apply Aquaphor moisturizing  lotion as directed Lotrisone Discharge Instruction: Apply Lotrisone to periwound Topical Primary Dressing KerraCel Ag Gelling Fiber Dressing, 2x2 in (silver alginate) Discharge Instruction: Apply silver alginate to wound bed as instructed Secondary Dressing Woven Gauze Sponge, Non-Sterile 4x4 in Discharge Instruction: Apply over primary dressing as directed. Secured With [employer], 4.5x3.1 (in/yd) Discharge Instruction: Secure with Kerlix as directed. 56M Medipore H Soft Cloth Surgical T ape, 4 x 10 (in/yd) Discharge Instruction: Secure with tape as directed. Compression Wrap Compression Stockings Add-Ons Electronic Signature(s) Signed: 06/15/2022 4:42:49 PM By: Baruch Gouty RN, BSN Signed: 06/15/2022 4:54:16 PM By: Dellie Catholic RN Entered By: Dellie Catholic on 06/15/2022 09:19:38 -------------------------------------------------------------------------------- Wound Assessment Details Patient Name: Date of Service: [place], [name] 06/15/2022 8:15 A M Medical Record Number: [id number] Patient Account Number: [id number] Date of Birth/Sex: Treating RN: October 22, 1986 (36 y.o. [name] Primary Care Ronesha Heenan: Other Clinician: Janine Limbo Referring Winnie Barsky: Treating Urho Rio/Extender: Graciela Husbands, [name]: 336 Wound Status Wound Number: 64 Primary Etiology: Lymphedema Wound Location: Right, Plantar Foot Wound Status: Healed - Epithelialized Wounding Event: Gradually Appeared Comorbid History: Sleep Apnea, Hypertension, Paraplegia Date Acquired: 05/11/2022 Weeks Of Treatment: 5 Clustered Wound: No Photos Wound Measurements Length: (cm) Width: (cm) Depth: (cm) Area: (cm) Volume: (cm) 0 % Reduction in Area: 100% 0 % Reduction in Volume: 100% 0 Epithelialization: Large (67-100%) 0 Tunneling: No 0 Undermining: No Wound Description Classification: Full Thickness Without Exposed Support Structures Exudate Amount: None  Present Foul Odor After Cleansing: No Slough/Fibrino No Wound Bed Granulation Amount: None Present (0%) Exposed Structure Necrotic Amount: None Present (0%) Fascia Exposed: No Fat Layer (Subcutaneous Tissue) Exposed: No Tendon Exposed: No Muscle Exposed: No Joint Exposed: No Bone Exposed: No Electronic Signature(s) Signed: 06/15/2022 4:42:49 PM By: Baruch Gouty RN, BSN Signed: 06/15/2022 4:54:16 PM  By: Dellie Catholic RN Entered By: Dellie Catholic on 06/15/2022 09:20:23 -------------------------------------------------------------------------------- Wound Assessment Details Patient Name: Date of Service: [place], [name] 06/15/2022 8:15 A M Medical Record Number: [id number] Patient Account Number: [id number] Date of Birth/Sex: Treating RN: 1986-02-03 (36 y.o. [name] Primary Care Ola Raap: [name], [name] Other Clinician: Referring Lachandra Dettmann: Treating Emanuel Campos/Extender: Graciela Husbands, [name]: 336 Wound Status Wound Number: 65 Primary Etiology: Pressure Ulcer Wound Location: Left Upper Leg Wound Status: Open Wounding Event: Pressure Injury Comorbid History: Sleep Apnea, Hypertension, Paraplegia Date Acquired: 05/24/2022 Weeks Of Treatment: 2 Clustered Wound: No Photos Wound Measurements Length: (cm) 6.6 Width: (cm) 1.1 Depth: (cm) 1.1 Area: (cm) 5.702 Volume: (cm) 6.272 % Reduction in Area: 9.2% % Reduction in Volume: -898.7% Epithelialization: Small (1-33%) Tunneling: No Undermining: No Wound Description Classification: Category/Stage III Wound Margin: Flat and Intact Exudate Amount: Large Exudate Type: Serosanguineous Exudate Color: red, brown Foul Odor After Cleansing: No Slough/Fibrino Yes Wound Bed Granulation Amount: Medium (34-66%) Exposed Structure Granulation Quality: Red, Pink Fascia Exposed: No Necrotic Amount: Medium (34-66%) Fat Layer (Subcutaneous Tissue) Exposed: Yes Necrotic Quality: Adherent  Slough Tendon Exposed: No Muscle Exposed: No Joint Exposed: No Bone Exposed: No Treatment Notes Wound #65 (Upper Leg) Wound Laterality: Left Cleanser Soap and Water Discharge Instruction: May shower and wash wound with dial antibacterial soap and water prior to dressing change. Peri-Wound Care Zinc Oxide Ointment 30g tube Discharge Instruction: Apply Zinc Oxide to periwound with each dressing change Topical Gentamicin Discharge Instruction: As directed by physician Primary Dressing KerraCel Ag Gelling Fiber Dressing, 4x5 in (silver alginate) Discharge Instruction: Apply silver alginate to wound bed as instructed Secondary Dressing ALLEVYN Gentle Border, 4x4 (in/in) Discharge Instruction: Apply over primary dressing as directed. Secured With Compression Wrap Compression Stockings Environmental education officer) Signed: 06/15/2022 4:42:49 PM By: Baruch Gouty RN, BSN Signed: 06/15/2022 4:54:16 PM By: Dellie Catholic RN Entered By: Dellie Catholic on 06/15/2022 09:35:23 -------------------------------------------------------------------------------- Wound Assessment Details Patient Name: Date of Service: [facility], [name] 06/15/2022 8:15 A M Medical Record Number: [id number] Patient Account Number: [id number] Date of Birth/Sex: Treating RN: 09/19/1986 (36 y.o. [name] Primary Care Angeliki Mates: [name], [name] Other Clinician: Referring Thomasenia Dowse: Treating Rafiel Mecca/Extender: Graciela Husbands, [name]: 336 Wound Status Wound Number: [id number] Primary Etiology: Venous Leg Ulcer Wound Location: Right, Anterior Ankle Wound Status: Open Wounding Event: Pressure Injury Comorbid History: Sleep Apnea, Hypertension, Paraplegia Date Acquired: 06/15/2022 Weeks Of Treatment: 0 Clustered Wound: No Photos Wound Measurements Length: (cm) 1.8 Width: (cm) 0.9 Depth: (cm) 0.1 Area: (cm) 1.272 Volume: (cm) 0.127 % Reduction in Area: 0% % Reduction in Volume:  0% Epithelialization: Small (1-33%) Tunneling: No Undermining: No Wound Description Classification: Full Thickness Without Exposed Support Structures Wound Margin: Flat and Intact Exudate Amount: Small Exudate Type: Serous Exudate Color: amber Foul Odor After Cleansing: No Slough/Fibrino Yes Wound Bed Granulation Amount: Small (1-33%) Exposed Structure Granulation Quality: Red Fascia Exposed: No Necrotic Amount: Large (67-100%) Fat Layer (Subcutaneous Tissue) Exposed: No Necrotic Quality: Eschar, Adherent Slough Tendon Exposed: No Muscle Exposed: No Joint Exposed: No Bone Exposed: No Treatment Notes Wound #66 (Ankle) Wound Laterality: Right, Anterior Cleanser Peri-Wound Care Topical Primary Dressing KerraCel Ag Gelling Fiber Dressing, 2x2 in (silver alginate) Discharge Instruction: Apply silver alginate to wound bed as instructed Secondary Dressing Woven Gauze Sponge, Non-Sterile 4x4 in Discharge Instruction: Apply over primary dressing as directed. Secured With Compression Wrap Compression Stockings Environmental education officer) Signed: 06/15/2022 4:42:49 PM By: Baruch Gouty RN, BSN  Signed: 06/15/2022 4:54:16 PM By: Dellie Catholic RN Entered By: Dellie Catholic on 06/15/2022 09:21:05 -------------------------------------------------------------------------------- Vitals Details Patient Name: Date of Service: [facility], [name] 06/15/2022 8:15 A M Medical Record Number: [id number] Patient Account Number: [id number] Date of Birth/Sex: Treating RN: 1986-10-09 (36 y.o. [name] Primary Care Makiyah Zentz: Vaughn, Bright Other Clinician: Referring Kassy Mcenroe: Treating Kizzi Overbey/Extender: Graciela Husbands, [name]: 336 Vital Signs Time Taken: 08:54 Temperature (F): 98.9 Height (in): 70 Pulse (bpm): 128 Weight (lbs): 216 Respiratory Rate (breaths/min): 16 Body Mass Index (BMI): 31 Blood Pressure (mmHg): 124/75 Reference Range: 80 -  120 mg / dl Electronic Signature(s) Signed: 06/15/2022 11:46:15 AM By: Blanche East RN Entered By: Blanche East on 06/15/2022 08:58:56

## 2022-06-23 DIAGNOSIS — G8220 Paraplegia, unspecified: Secondary | ICD-10-CM | POA: Diagnosis not present

## 2022-06-23 DIAGNOSIS — I890 Lymphedema, not elsewhere classified: Secondary | ICD-10-CM | POA: Diagnosis not present

## 2022-06-23 DIAGNOSIS — L97509 Non-pressure chronic ulcer of other part of unspecified foot with unspecified severity: Secondary | ICD-10-CM | POA: Diagnosis not present

## 2022-06-23 DIAGNOSIS — L97909 Non-pressure chronic ulcer of unspecified part of unspecified lower leg with unspecified severity: Secondary | ICD-10-CM | POA: Diagnosis not present

## 2022-06-29 ENCOUNTER — Encounter (HOSPITAL_BASED_OUTPATIENT_CLINIC_OR_DEPARTMENT_OTHER): Payer: BC Managed Care – PPO | Attending: General Surgery | Admitting: General Surgery

## 2022-06-29 DIAGNOSIS — L97511 Non-pressure chronic ulcer of other part of right foot limited to breakdown of skin: Secondary | ICD-10-CM | POA: Insufficient documentation

## 2022-06-29 DIAGNOSIS — I87332 Chronic venous hypertension (idiopathic) with ulcer and inflammation of left lower extremity: Secondary | ICD-10-CM | POA: Diagnosis not present

## 2022-06-29 DIAGNOSIS — L97312 Non-pressure chronic ulcer of right 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: Secondary | ICD-10-CM | POA: Insufficient documentation

## 2022-06-29 DIAGNOSIS — L97518 Non-pressure chronic ulcer of other part of right foot with other specified severity: Secondary | ICD-10-CM | POA: Insufficient documentation

## 2022-06-29 DIAGNOSIS — G8221 Paraplegia, complete: Secondary | ICD-10-CM | POA: Diagnosis not present

## 2022-06-29 DIAGNOSIS — L97322 Non-pressure chronic ulcer of left 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: Secondary | ICD-10-CM | POA: Insufficient documentation

## 2022-06-29 DIAGNOSIS — L89323 Pressure ulcer of left buttock, stage 3: Secondary | ICD-10-CM | POA: Insufficient documentation

## 2022-06-29 DIAGNOSIS — L97521 Non-pressure chronic ulcer of other part of left foot limited to breakdown of skin: Secondary | ICD-10-CM | POA: Diagnosis not present

## 2022-06-29 NOTE — Progress Notes (Signed)
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 disclosed this week, we can only look forward to next week 01/03/17; the patient had concerning areas of erythema last week, already on doxycycline for UTI through his primary doctor. The erythema is absolutely no better there is warmth and swelling both medially from the left lateral leg wound and also the dorsal left foot. 01/06/17- Patient is here for follow-up evaluation of his left lateral leg ulcer and bilateral feet ulcers. He is on oral antibiotic therapy, tolerating that. Nursing staff and the patient states that the erythema is improved from Monday. 01/13/17; the predominant left lateral leg wound continues to be problematic. I had put Apligraf on him earlier this month once. However he subsequently developed what appeared to be an intense cellulitis around the left lateral leg wound. I gave him Dalvance I think on 2/12 perhaps 2/13 he continues on cefdinir. The erythema is still present but the warmth and swelling is improved. I am hopeful that the cellulitis part of this control. I wouldn't be surprised if there is an element of venous inflammation as well. 01/17/17. The erythema is present but better in the left leg. His left lateral leg wound still does not have [name] viable surface buttons certain parts of this long thin wound it appears like there has been improvement in dimensions. 01/20/17; the erythema still present but  much better in the left leg. I'[name] thinking this is his usual degree of chronic venous inflammation. The wound on the left leg looks somewhat better. Is less surface slough 01/27/17; erythema is back to the chronic venous inflammation. The wound on the left leg is somewhat better. I am back to the point where I like to try an Apligraf once again 02/10/17; slight improvement in wound dimensions. Apligraf #2. He is completing his doxycycline 02/14/17; patient arrives today having completed doxycycline last Thursday. This was supposed to be [name] nurse visit however once again he hasn't tense erythema from the medial part of his wound extending over the lower leg. Also erythema in his foot this is roughly in the same distribution as last time. He has baseline chronic venous inflammation however this is [name] lot worse than the baseline I have learned to accept the on him is baseline inflammation 02/24/17- patient is here for follow-up evaluation. He is tolerating compression therapy. His voicing no complaints or concerns he is here anticipating an Apligraf 03/03/17; he arrives today with an adherent necrotic surface. I don't think this is surface is going to be amenable for Apligraf's. The erythema around his wound and on the left dorsal foot has resolved he is off antibiotics 03/10/17; better-looking surface today. I don't think he can tolerate Apligraf's. He tells me he had [name] wound VAC after [name] skin graft years ago to  this area and they had difficulty with [name] seal. The erythema continues to be stable around this some degree of chronic venous inflammation but he also has recurrent cellulitis. We have been using Iodoflex 03/17/17; continued improvement in the surface and may be small changes in dimensions. Using Iodoflex which seems the only thing that will control his surface 03/24/17- He is here for follow up evaluation of his LLE lateral ulceration and ulcer to right dorsal foot/toe space. He is voicing no complaints or  concerns, He is tolerating compression wrap. 03/31/17 arrives today with [name] much healthier looking wound on the left lower extremity. We have been using Iodoflex for [name] prolonged period of time which has for the first time prepared and adequate looking wound bed although we have not had much in the way of wound dimension improvement. He also has [name] small wound between the first and second toe on the right 04/07/17; arrives today with [name] healthy-looking wound bed and at least the top 50% of this wound appears to be now her. No debridement was required I have changed him to [facility] last week after prolonged Iodoflex. He did not do well with Apligraf's. We've had [name] re-opening between the first and second toe on the right 04/14/17; arrives today with [name] healthier looking wound bed contractions and the top 50% of this wound and some on the lesser 50%. Wound bed appears healthy. The area between the first and second toe on the right still remains problematic 04/21/17; continued very gradual improvement. Using [facility] 04/28/17; continued very gradual improvement in the left lateral leg venous insufficiency wound. His periwound erythema is very mild. We have been using Hydrofera Blue. Wound is making progress especially in the superior 50% 05/05/17; he continues to have very gradual improvement in the left lateral venous insufficiency wound. Both in terms with an length rings are improving. I debrided this every 2 [name] with #5 curet and we have been using Hydrofera Blue and again making good progress With regards to the wounds between his right first and second toe which I thought might of been tinea pedis he is not making as much progress very dry scaly skin over the area. Also the area at the base of the left first and second toe in [name] similar condition 05/12/17; continued gradual improvement in the refractory left lateral venous insufficiency wound on the left. Dimension smaller. Surface still  requiring debridement using Hydrofera Blue 05/19/17; continued gradual improvement in the refractory left lateral venous ulceration. Careful inspection of the wound bed underlying rumination suggested some degree of epithelialization over the surface no debridement indicated. Continue Hydrofera Blue difficult areas between his toes first and third on the left than first and second on the right. I'[name] going to change to silver alginate from silver collagen. Continue ketoconazole as I suspect underlying tinea pedis 05/26/17; left lateral leg venous insufficiency wound. We've been using Hydrofera Blue. I believe that there is expanding epithelialization over the surface of the wound albeit not coming from the wound circumference. This is [name] bit of an odd situation in which the epithelialization seems to be coming from the surface of the wound rather than in the exact circumference. There is still small open areas mostly along the lateral margin of the wound. He has unchanged areas between the left first and second and the right first second toes which I been treating for tenia pedis 06/02/17; left lateral leg venous insufficiency wound. We have been using Hydrofera Blue. Somewhat smaller  from the wound circumference. The surface of the wound remains [name] bit on it almost epithelialized sedation in appearance. I use an open curette today debridement in the surface of all of this especially the edges Small open wounds remaining on the dorsal right first and second toe interspace and the plantar left first second toe and her face on the left 06/09/17; wound on the left lateral leg continues to be smaller but very gradual and very dry surface using Hydrofera Blue 06/16/17 requires weekly debridements now on the left lateral leg although this continues to contract. I changed to silver collagen last week because of dryness of the wound bed. Using Iodoflex to the areas on his first and second toes/web space  bilaterally 06/24/17; patient with history of paraplegia also chronic venous insufficiency with lymphedema. Has [name] very difficult wound on the left lateral leg. This has been gradually reducing in terms of with but comes in with [name] very dry adherent surface. High switch to silver collagen [name] week or so ago with hydrogel to keep the area moist. This is been refractory to multiple dressing attempts. He also has areas in his first and second toes bilaterally in the anterior and posterior web space. I had been using Iodoflex here after [name] prolonged course of silver alginate with ketoconazole was ineffective [question tinea pedis] 07/14/17; patient arrives today with [name] very difficult adherent material over his left lateral lower leg wound. He also has surrounding erythema and poorly controlled edema. He was switched his Santyl last visit which the nurses are applying once during his doctor visit and once on [name] nurse visit. He was also reduced to 2 layer compression I'[name] not exactly sure of the issue here. 07/21/17; better surface today after 1 week of Iodoflex. Significant cellulitis that we treated last week also better. [Doxycycline] 07/28/17 better surface today with now 2 [name] of Iodoflex. Significant cellulitis treated with doxycycline. He has now completed the doxycycline and he is back to his usual degree of chronic venous inflammation/stasis dermatitis. He reminds me he has had ablations surgery here 08/04/17; continued improvement with Iodoflex to the left lateral leg wound in terms of the surface of the wound although the dimensions are better. 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 Problematic areas on the plantar aspect of the first second toe web space on the left and the dorsal aspect of the first second toe web space on the right. At one point I felt these were probably related to chronic fungal infections in treated him  aggressively for this although we have not made any improvement here. 08/11/17; left lateral leg. Surface continues to improve with the Iodoflex although we are not seeing much improvement in overall wound dimensions. Areas on his plantar left foot and right foot show no improvement. In fact the right foot looks somewhat worse 08/18/17; left lateral leg. We changed to [facility] Blue last week after [name] prolonged course of Iodoflex which helps get the surface better. It appears that the wound with is improved. Continue with difficult areas on the left dorsal first second and plantar first second on the right 09/01/17; patient arrives in clinic today having had [name] temperature of 103 yesterday. He was seen in the ER and [facility]. The patient was concerned he could have cellulitis again in the right leg however they diagnosed him with [name] UTI and he is now on Keflex. He has [name] history of cellulitis which is  been recurrent and difficult but this is been in the left leg, in the past 5 use doxycycline. He does in and out catheterizations at home which are risk factors for UTI 09/08/17; patient will be completing his Keflex this weekend. The erythema on the left leg is considerably better. He has [name] new wound today on the medial part of the right leg small superficial almost looks like [name] skin tear. He has worsening of the area on the right dorsal first and second toe. His major area on the left lateral leg is better. Using Hydrofera Blue on all areas 09/15/17; gradual reduction in width on the long wound in the left lateral leg. No debridement required. He also has wounds on the plantar aspect of his left first second toe web space and on the dorsal aspect of the right first second toe web space. 09/22/17; there continues to be very gradual improvements in the dimensions of the left lateral leg wound. He hasn't round erythematous spot with might be pressure on his wheelchair. There is no evidence obviously of infection  no purulence no warmth He has [name] dry scaled area on the plantar aspect of the left first second toe Improved area on the dorsal right first second toe. 09/29/17; left lateral leg wound continues to improve in dimensions mostly with an is still [name] fairly long but increasingly narrow wound. He has [name] dry scaled area on the plantar aspect of his left first second toe web space Increasingly concerning area on the dorsal right first second toe. In fact I am concerned today about possible cellulitis around this wound. The areas extending up his second toe and although there is deformities here almost appears to abut on the nailbed. 10/06/17; left lateral leg wound continues to make very gradual progress. Tissue culture I did from the right first second toe dorsal foot last time grew MRSA and enterococcus which was vancomycin sensitive. This was not sensitive to clindamycin or doxycycline. He is allergic to Zyvox and sulfa we have therefore arrange for him to have dalvance infusion tomorrow. He is had this in the past and tolerated it well 10/20/17; left lateral leg wound continues to make decent progress. This is certainly reduced in terms of with there is advancing epithelialization.The cellulitis in the right foot looks better although he still has [name] deep wound in the dorsal aspect of the first second toe web space. Plantar left first toe web space on the left I think is making some progress 10/27/17; left lateral leg wound continues to make decent progress. Advancing epithelialization.using Hydrofera Blue The right first second toe web space wound is better-looking using silver alginate Improvement in the left plantar first second toe web space. Again using silver alginate 11/03/17 left lateral leg wound continues to make decent progress albeit slowly. Using [facility] The right per second toe web space continues to be [name] very problematic looking punched out wound. I obtained [name] piece of tissue for deep  culture I did extensively treated this for fungus. It is difficult to imagine that this is [name] pressure area as the patient states other than going outside he doesn't really wear shoes at home The left plantar first second toe web space looked fairly senescent. Necrotic edges. This required debridement change to [facility] Blue to all wound areas 11/10/17; left lateral leg wound continues to contract. Using Hydrofera Blue On the right dorsal first second toe web space dorsally. Culture I did of this area last week grew MRSA there  is not an easy oral option in this patient was multiple antibiotic allergies or intolerances. This was only [name] rare culture isolate I'[name] therefore going to use Bactroban under silver alginate On the left plantar first second toe web space. Debridement is required here. This is also unchanged 11/17/17; left lateral leg wound continues to contract using Hydrofera Blue this is no longer the major issue. The major concern here is the right first second toe web space. He now has an open area going from dorsally to the plantar aspect. There is now wound on the inner lateral part of the first toe. Not [name] very viable surface on this. There is erythema spreading medially into the forefoot. No major change in the left first second toe plantar wound 11/24/17; left lateral leg wound continues to contract using Hydrofera Blue. Nice improvement today The right first second toe web space all of this looks [name] lot less angry than last week. I have given him clindamycin and topical Bactroban for MRSA and terbinafine for the possibility of underlining tinea pedis that I could not control with ketoconazole. Looks somewhat better The area on the plantar left first second toe web space is weeping with dried debris around the wound 12/01/17; left lateral leg wound continues to contract he Hydrofera Blue. It is becoming thinner in terms of with nevertheless it is making good improvement. The right first  second toe web space looks less angry but still [name] large necrotic-looking wounds starting on the plantar aspect of the right foot extending between the toes and now extensively on the base of the right second toe. I gave him clindamycin and topical Bactroban for MRSA anterior benefiting for the possibility of underlying tinea pedis. Not looking better today The area on the left first/second toe looks better. Debrided of necrotic debris 12/05/17* the patient was worked in urgently today because over the weekend he found blood on his incontinence bad when he woke up. He was found to have an ulcer by his wife who does most of his wound care. He came in today for [place] to look at this. He has not had [name] history of wounds in his buttocks in spite of his paraplegia. 12/08/17; seen in follow-up today at his usual appointment. He was seen earlier this week and found to have [name] new wound on his buttock. We also follow him for wounds on the left lateral leg, left first second toe web space and right first second toe web space 12/15/17; we have been using Hydrofera Blue to the left lateral leg which has improved. The right first second toe web space has also improved. Left first second toe web space plantar aspect looks stable. The left buttock has worsened using Santyl. Apparently the buttock has drainage 12/22/17; we have been using Hydrofera Blue to the left lateral leg which continues to improve now 2 small wounds separated by normal skin. He tells [place] he had [name] fever up to 100 yesterday he is prone to UTIs but has not noted anything different. He does in and out catheterizations. The area between the first and second toes today does not look good necrotic surface covered with what looks to be purulent drainage and erythema extending into the third toe. I had gotten this to something that I thought look better last time however it is not look good today. He also has [name] necrotic surface over the buttock wound which is  expanded. I thought there might be infection under here so I removed  [name] lot of the surface with [name] #5 curet though nothing look like it really needed culturing. He is been using Santyl to this area 12/27/17; his original wound on the left lateral leg continues to improve using Hydrofera Blue. I gave him samples of Baxdella although he was unable to take them out of fear for an allergic reaction ["lump in his throat"].the culture I did of the purulent drainage from his second toe last week showed both enterococcus and [name] set Enterobacter I was also concerned about the erythema on the bottom of his foot although paradoxically although this looks somewhat better today. 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 We've been using silver alginate here. They came up today that he sleeps in [name] recliner, I'[name] not sure why but I asked him to stop this 01/03/18; his original wound we've been using Hydrofera Blue is now separated into 2 areas. Ulcer on his left buttock is better he is off the recliner and sleeping in bed Finally both wound areas between his first and second toes also looks some better 01/10/18; his original wound on the left lateral leg is now separated into 2 wounds we've been using Hydrofera Blue Ulcer on his left buttock has some drainage. There is [name] small probing site going into muscle layer superiorly.using silver alginate -He arrives today with [name] deep tissue injury on the left heel The wound on the dorsal aspect of his first second toe on the left looks [name] lot betterusing silver alginate ketoconazole The area on the first second toe web space on the right also looks [name] lot bette 01/17/18; his original wound on the left lateral leg continues to progress using Hydrofera Blue Ulcer on his left buttock also is smaller surface healthier except for [name] small probing site going into the muscle layer superiorly. 2.4 cm  of tunneling in this area DTI on his left heel we have only been offloading. Looks better than last week no threatened open no evidence of infection the wound on the dorsal aspect of the first second toe on the left continues to look like it's regressing we have only been using silver alginate and terbinafine orally The area in the first second toe web space on the right also looks to be [name] lot better using silver alginate and terbinafine I think this was prompted by tinea pedis 01/31/18; the patient was hospitalized in [place] last week apparently for [name] complicated UTI. He was discharged on cefepime he does in and out catheterizations. In the hospital he was discovered [name] I don't mild elevation of AST and ALT and the terbinafine was stopped.predictably the pressure ulcer on s his buttock looks betterusing silver alginate. The area on the left lateral leg also is better using Hydrofera Blue. The area between the first and second toes on the left better. First and second toes on the right still substantial but better. Finally the DTI on the left heel has held together and looks like it's resolving 02/07/18-he is here in follow-up evaluation for multiple ulcerations. He has new injury to the lateral aspect of the last issue [name] pressure ulcer, he states this is from adhesive removal trauma. He states he has tried multiple adhesive products with no success. All other ulcers appear stable. The left heel DTI is resolving. We will continue with same treatment plan and follow-up next week. 02/14/18; follow-up for multiple areas. He has [name] new  area last week on the lateral aspect of his pressure ulcer more over the posterior trochanter. The original pressure ulcer looks quite stable has healthy granulation. 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 Almost fully epithelialized on the original superior  area using Hydrofera Blue DTI on the left heel has peeled off this week to reveal [name] small superficial wound under denuded skin and subcutaneous tissue Both areas between the first and second toes look better including nothing open on the left 02/21/18; The patient's wounds on his left ischial tuberosity and posterior left greater trochanter actually looked better. He has [name] large area of irritation around the area which I think is contact dermatitis. I am doubtful that this is fungal His original wound on the left lateral calf continues to improve we have been using Hydrofera Blue There is no open area in the left first second toe web space although there is [name] lot of thick callus The DTI on the left heel required debridement today of necrotic surface eschar and subcutaneous tissue using silver alginate Finally the area on the right first second toe webspace continues to contract using silver alginate and ketoconazole 02/28/18 Left ischial tuberosity wounds look better using silver alginate. Original wound on the left calf only has one small open area left using Hydrofera Blue DTI on the left heel required debridement mostly removing skin from around this wound surface. Using silver alginate The areas on the right first/second toe web space using silver alginate and ketoconazole 03/08/18 on evaluation today patient appears to be doing decently well as best I can tell in regard to his wounds. This is the first time that I have seen him as he generally is followed by Dr. Dellia Nims. With that being said none of his wounds appear to be infected he does have an area where there is some skin covering what appears to be [name] new wound on the left dorsal surface of his great toe. This is right at the nail bed. With that being said I do believe that debrided away some of the excess skin can be of benefit in this regard. Otherwise he has been tolerating the dressing changes without complication. 03/14/18; patient  arrives today with the multiplicity of wounds that we are following. He has not been systemically unwell Original wound on the left lateral calf now only has 2 small open areas we've been using Hydrofera Blue which should continue The deep tissue injury on the left heel requires debridement today. We've been using silver alginate The left first second toe and the right first second toe are both are reminiscence what I think was tinea pedis. Apparently some of the callus Surface between the toes was removed last week when it started draining. Purulent drainage coming from the wound on the ischial tuberosity on the left. 03/21/18-He is here in follow-up evaluation for multiple wounds. 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 He tolerated debridement. The only change to last week's recommendations is to discontinue antifungal cream between toes. He will follow-up next week 03/28/18; following up for multiple wounds;Concern this week is streaking redness and swelling in the right foot. He is going to need antibiotics for this. 03/31/18; follow-up for right foot cellulitis. Streaking redness and swelling in the right foot on 03/28/18. 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 I put him on  clindamycin 300 mg every 6 and brought him in for [name] quick check. He has an open wound between his first and second toes on the right foot as [name] potential source. 04/04/18; Right foot cellulitis is resolving he is completing clindamycin. This is truly good news Left lateral calf wound which is initial wound only has one small open area inferiorly this is close to healing out. He has compression stockings. We will use Hydrofera Blue right down to the epithelialization of this Nonviable surface on the left heel which was initially pressure with [name] DTI. We've been using Hydrofera Blue. I'[name] going to switch this back to silver  alginate Left first second toe/tinea pedis this looks better using silver alginate Right first second toe tinea pedis using silver alginate Large pressure ulcers on theLeft ischial tuberosity. Small wound here Looks better. I am uncertain about the surface over the large wound. Using silver alginate 04/11/18; Cellulitis in the right foot is resolved Left lateral calf wound which was his original wounds still has 2 tiny open areas remaining this is just about closed Nonviable surface on the left heel is better but still requires debridement Left first second toe/tinea pedis still open using silver alginate Right first second toe wound tinea pedis I asked him to go back to using ketoconazole and silver alginate Large pressure ulcers on the left ischial tuberosity this shear injury here is resolved. Wound is smaller. No evidence of infection using silver alginate 04/18/18; Patient arrives with an intense area of cellulitis in the right mid lower calf extending into the right heel area. Bright red and warm. Smaller area on the left anterior leg. He has [name] significant history of MRSA. He will definitely need antibioticsdoxycycline He now has 2 open areas on the left ischial tuberosity the original large wound and now [name] satellite area which I think was above his initial satellite areas. Not [name] wonderful surface on this satellite area surrounding erythema which looks like pressure related. His left lateral calf wound again his original wound is just about closed Left heel pressure injury still requiring debridement Left first second toe looks [name] lot better using silver alginate Right first second toe also using silver alginate and ketoconazole cream also looks better 04/20/18; the patient was worked in early today out of concerns with his cellulitis on the right leg. I had started him on doxycycline. This was 2 days ago. His wife was concerned about the swelling in the area. Also concerned about the left  buttock. He has not been systemically unwell no fever chills. No nausea vomiting or diarrhea 04/25/18; the patient's left buttock wound is continued to deteriorate he is using Hydrofera Blue. He is still completing clindamycin for the cellulitis on the right leg although all of this looks better. 05/02/18 Left buttock wound still with [name] lot of drainage and [name] very tightly adherent fibrinous necrotic surface. He has [name] deeper area superiorly The left lateral calf wound is still closed DTI wound on the left heel necrotic surface especially the circumference using Iodoflex Areas between his left first second toe and right first second toe both look better. Dorsally and the right first second toe he had [name] necrotic surface although at smaller. In using silver alginate and ketoconazole. I did [name] culture last week which was [name] deep tissue culture of the reminiscence of the open wound on the right first second toe dorsally. This grew [name] few Acinetobacter and [name] few methicillin-resistant staph aureus. Nevertheless the area actually this  week looked better. I didn't feel the need to specifically address this at least in terms of systemic antibiotics. 05/09/18; wounds are measuring larger more drainage per our intake. We are using Santyl covered with alginate on the large superficial buttock wounds, Iodosorb on the left heel, ketoconazole and silver alginate to the dorsal first and second toes bilaterally. 05/16/18; The area on his left buttock better in some aspects although the area superiorly over the ischial tuberosity required an extensive debridement.using Santyl Left heel appears stable. Using Iodoflex The areas between his first and second toes are not bad however there is spreading erythema up the dorsal aspect of his left foot this looks like cellulitis again. He is insensate the erythema is really very brilliant.o Erysipelas He went to see an allergist days ago because he was itching part of this he had lab  work done. This showed [name] white count of 15.1 with 70% neutrophils. Hemoglobin of 11.4 and [name] platelet count of 659,000. Last white count we had in [facility] was [name] 2-1/2 years ago which was 25.9 but he was ill at the time. He was able to show me some lab work that was done by his primary physician the pattern is about the same. 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 But prompted me to go ahead and do x-rays of both feet and the pelvis rule out osteomyelitis. He also had [name] comprehensive metabolic panel this was reasonably normal his albumin was 3.7 liver function tests BUN/creatinine all normal 05/23/18; x-rays of both his feet from last week were negative for underlying pulmonary abnormality. The x-ray of his pelvis however showed mild irregularity in the left ischial which may represent some early osteomyelitis. The wound in the left ischial continues to get deeper clearly now exposed muscle. Each week necrotic surface material over this area. Whereas the rest of the wounds do not look so bad. The left ischial wound we have been using Santyl and calcium alginate T the left heel surface necrotic debris using Iodoflex o The left lateral leg is still healed Areas on the left dorsal foot and the right dorsal foot are about the same. There is some inflammation on the left which might represent contact dermatitis, fungal dermatitis I am doubtful cellulitis although this looks better than last week 05/30/18; CT scan done at Hospital did not show any osteomyelitis or abscess. Suggested the possibility of underlying cellulitis although I don't see [name] lot of evidence of this at the bedside The wound itself on the left buttock/upper thigh actually looks somewhat better. No debridement Left heel also looks better no debridement continue Iodoflex Both dorsal first second toe spaces appear better using Lotrisone. Left still required debridement 06/06/18; Intake reported  some purulent looking drainage from the left gluteal wound. Using Santyl and calcium alginate Left heel looks better although still [name] nonviable surface requiring debridement The left dorsal foot first/second webspace actually expanding and somewhat deeper. I may consider doing [name] shave biopsy of this area Right dorsal foot first/second webspace appears stable to improved. Using Lotrisone and silver alginate to both these areas 06/13/18 Left gluteal surface looks better. Now separated in the 2 wounds. No debridement required. Still drainage. We'll continue silver alginate Left heel continues to look better with Iodoflex continue this for at least another week Of his dorsal foot wounds the area on the left still has some depth although it looks better than last week. We've been using  Lotrisone and silver alginate 06/20/18 Left gluteal continues to look better healthy tissue Left heel continues to look better healthy granulation wound is smaller. He is using Iodoflex and his long as this continues continue the Iodoflex Dorsal right foot looks better unfortunately dorsal left foot does not. There is swelling and erythema of his forefoot. He had minor trauma to this several days ago but doesn't think this was enough to have caused any tissue injury. Foot looks like cellulitis, we have had this problem before 06/27/18 on evaluation today patient appears to be doing [name] little worse in regard to his foot ulcer. 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 Both of which would really be his only options for treating this infection. In the past he has been given and effusion of Orbactiv. This is done very well for him in the past again it's one time dosing IV antibiotic therapy. Subsequently I do believe this is something we're gonna need to see about doing at this point in time.  Currently his other wounds seem to be doing somewhat better in my pinion I'[name] pretty happy in that regard. 07/03/18 on evaluation today patient's wounds actually appear to be doing fairly well. He has been tolerating the dressing changes without complication. All in all he seems to be showing signs of improvement. In regard to the antibiotics he has been dealing with infectious disease since I saw him last week as far as getting this scheduled. In the end [facility]'s [facility] this coming Friday. In the meantime he has been continuing to perform the dressing changes in such as previous. There does not appear to be any evidence of infection worsengin at this time. 07/10/18; Since I last saw this man 2 [name] ago things have actually improved. IV antibiotics of resulted in less forefoot erythema although there is still some present. He is not systemically unwell Left buttock wounds 2 now have no depth there is increased epithelialization Using silver alginate Left heel still requires debridement using Iodoflex Left dorsal foot still with [name] sizable wound about the size of [name] border but healthy granulation Right dorsal foot still with [name] slitlike area using silver alginate 07/18/18; the patient's cellulitis in the left foot is improved in fact I think it is on its way to resolving. Left buttock wounds 2 both look better although the larger one has hypertension granulation we've been using silver alginate Left heel has some thick circumferential redundant skin over the wound edge which will need to be removed today we've been using Iodoflex Left dorsal foot is still [name] sizable wound required debridement using silver alginate The right dorsal foot is just about closed only [name] small open area remains here 07/25/18; left foot cellulitis is resolved Left buttock wounds 2 both look better. Hyper-granulation on the major area Left heel as some debris over the  surface but otherwise looks [name] healthier wound. Using silver collagen Right dorsal foot is just about closed 07/31/18; arrives with our intake nurse worried about purulent drainage from the buttock. We had hyper-granulation here last week His buttock wounds 2 continue to look better Left heel some debris over the surface but measuring smaller. Right dorsal foot unfortunately has openings between the toes Left foot superficial wound looks less aggravated. 08/07/18 Buttock wounds continue to  look better although some of her granulation and the larger medial wound. silver alginate Left heel continues to look [name] lot better.silver collagen Left foot superficial wound looks less stable. Requires debridement. He has [name] new wound superficial area on the foot on the lateral dorsal foot. Right foot looks better using silver alginate without Lotrisone 08/14/2018; patient was in the ER last week diagnosed with [name] UTI. He is now on Cefpodoxime and Macrodantin. Buttock wounds continued to be smaller. Using silver alginate Left heel continues to look better using silver collagen Left foot superficial wound looks as though it is improving Right dorsal foot area is just about healed. 08/21/2018; patient is completed his antibiotics for his UTI. He has 2 open areas on the buttocks. There is still not closed although the surface looks satisfactory. Using silver alginate Left heel continues to improve using silver collagen The bilateral dorsal foot areas which are at the base of his first and second toes/possible tinea pedis are actually stable on the left but worse on the right. The area on the left required debridement of necrotic surface. After debridement I obtained [name] specimen for PCR culture. The right dorsal foot which is been just about healed last week is now reopened 08/28/2018; culture done on the left dorsal foot showed coag negative staph both staph epidermidis and Lugdunensis. I think this is worthwhile  initiating systemic treatment. I will use doxycycline given his long list of allergies. The area on the left heel slightly improved but still requiring debridement. The large wound on the buttock is just about closed whereas the smaller one is larger. Using silver alginate in this area 09/04/2018; patient is completing his doxycycline for the left foot although this continues to be [name] very difficult wound area with very adherent necrotic debris. We are using silver alginate to all his wounds right foot left foot and the small wounds on his buttock, silver collagen on the left heel. 09/11/2018; once again this patient has intense erythema and swelling of the left forefoot. Lesser degrees of erythema in the right foot. He has [name] long list of allergies and intolerances. I will reinstitute doxycycline. 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 Using silver alginate Left heel also looks better using silver collagen Unfortunately both the areas on his feet look worse. The area on the left first second webspace is now gone through to the plantar part of his foot. The area on the left foot anteriorly is irritated with erythema and swelling in the forefoot. 09/25/2018 His wound on the left plantar heel looks better. Using silver collagen The area on the left buttock 2 small remnant areas. One is closed one is still open. Using silver alginate The areas between both his first and second toes look worse. This in spite of long-standing antifungal therapy with ketoconazole and silver alginate which should have antifungal activity He has small areas around his original wound on the left calf one is on the bottom of the original scar tissue and one superiorly both of these are small and superficial but again given wound history in this site this is worrisome 10/02/2018 Left plantar heel continues to gradually contract using silver collagen Left buttock wound is  unchanged using silver alginate The areas on his dorsal feet between his first and second toes bilaterally look about the same. I prescribed clindamycin ointment to see if we can address chronic staph colonization and also the underlying possibility  of erythrasma The left lateral lower extremity wound is actually on the lateral part of his ankle. Small open area here. We have been using silver alginate 10/09/2018; Left plantar heel continues to look healthy and contract. No debridement is required Left buttock slightly smaller with [name] tape injury wound just below which was new this week Dorsal feet somewhat improved I have been using clindamycin Left lateral looks lower extremity the actual open area looks worse although [name] lot of this is epithelialized. I am going to change to silver collagen today He has [name] lot more swelling in the right leg although this is not pitting not red and not particularly warm there is [name] lot of spasm in the right leg usually indicative of people with paralysis of some underlying discomfort. We have reviewed his vascular status from 2017 he had [name] left greater saphenous vein ablation. I wonder about referring him back to vascular surgery if the area on the left leg continues to deteriorate. 10/16/2018 in today for follow-up and management of multiple lower extremity ulcers. His left Buttock wound is much lower smaller and almost closed completely. The wound to the left ankle has began to reopen with Epithelialization and some adherent slough. He has multiple new areas to the left foot and leg. The left dorsal foot without much improvement. Wound present between left great webspace and 2nd toe. Erythema and edema present right leg. Right LE ultrasound obtained on 10/10/18 was negative for DVT . 10/23/2018; Left buttock is closed over. Still dry macerated skin but there is no open wound. I suspect this is chronic pressure/moisture Left lateral calf is quite [name] bit worse than  when I saw this last. There is clearly drainage here he has macerated skin into the left plantar heel. We will change the primary dressing to alginate Left dorsal foot has some improvement in overall wound area. Still using clindamycin and silver alginate Right dorsal foot about the same as the left using clindamycin and silver alginate The erythema in the right leg has resolved. He is DVT rule out was negative Left heel pressure area required debridement although the wound is smaller and the surface is health 10/26/2018 The patient came back in for his nurse check today predominantly because of the drainage coming out of the left lateral leg with [name] recent reopening of his original wound on the left lateral calf. He comes in today with [name] large amount of surrounding erythema around the wound extending from the calf into the ankle and even in the area on the dorsal foot. He is not systemically unwell. He is not febrile. Nevertheless this looks like cellulitis. We have been using silver alginate to the area. I changed him to [name] regular visit and I am going to prescribe him doxycycline. The rationale here is [name] long list of medication intolerances and [name] history of MRSA. I did not see anything that I thought would provide [name] valuable culture 10/30/2018 Follow-up from his appointment 4 days ago with really an extensive area of cellulitis in the left calf left lateral ankle and left dorsal foot. I put him on doxycycline. He has [name] long list of medication allergies which are true allergy reactions. 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 In any case he is [name] lot better today. The erythema especially in the anterior and lateral left calf is better. He still has left ankle erythema. He also is complaining about increasing  edema in the right leg we have only been using Kerlix Coban and he has been doing the wraps at home. Finally he has [name] spotty  rash on the medial part of his upper left calf which looks like folliculitis or perhaps wrap occlusion type injury. Small superficial macules not pustules 11/06/18 patient arrives today with again [name] considerable degree of erythema around the wound on the left lateral calf extending into the dorsal ankle and dorsal foot. This is [name] lot worse than when I saw this last week. He is on doxycycline really with not [name] lot of improvement. He has not been systemically unwell Wounds on the; left heel actually looks improved. Original area on the left foot and proximity to the first and second toes looks about the same. He has superficial areas on the dorsal foot, anterior calf and then the reopening of his original wound on the left lateral calf which looks about the same The only area he has on the right is the dorsal webspace first and second which is smaller. He has [name] large area of dry erythematous skin on the left buttock small open area here. 11/13/2018; the patient arrives in much better condition. The erythema around the wound on the left lateral calf is [name] lot better. Not sure whether this was the clindamycin or the TCA and ketoconazole or just in the improvement in edema control [stasis dermatitis]. In any case this is [name] lot better. The area on the left heel is very small and just about resolved using silver collagen we have been using silver alginate to the areas on his dorsal feet 11/20/2018; his wounds include the left lateral calf, left heel, dorsal aspects of both feet just proximal to the first second webspace. He is stable to slightly improved. I did not think any changes to his dressings were going to be necessary 11/27/2018 he has [name] reopening on the left buttock which is surrounded by what looks like tinea or perhaps some other form of dermatitis. The area on the left dorsal foot has some erythema around it I have marked this area but I am not sure whether this is cellulitis or not. Left heel is not  closed. Left calf the reopening is really slightly longer and probably worse 1/13; in general things look better and smaller except for the left dorsal foot. Area on the left heel is just about closed, left buttock looks better only [name] small wound remains in the skin looks better [using Lotrisone] 1/20; the area on the left heel only has [name] few remaining open areas here. Left lateral calf about the same in terms of size, left dorsal foot slightly larger right lateral foot still not closed. The area on the left buttock has no open wound and the surrounding skin looks [name] lot better 1/27; the area on the left heel is closed. Left lateral calf better but still requiring extensive debridements. The area on his left buttock is closed. He still has the open areas on the left dorsal foot which is slightly smaller in the right foot which is slightly expanded. We have been using Iodoflex on these areas as well 2/3; left heel is closed. Left lateral calf still requiring debridement using Iodoflex there is no open area on his left buttock however he has dry scaly skin over [name] large area of this. Not really responding well to the Lotrisone. Finally the areas on his dorsal feet at the level of the first second webspace are slightly smaller  on the right and about the same on the left. Both of these vigorously debrided with Anasept and gauze 2/10; left heel remains closed he has dry erythematous skin over the left buttock but there is no open wound here. Left lateral leg has come in and with. Still requiring debridement we have been using Iodoflex here. Finally the area on the left dorsal foot and right dorsal foot are really about the same extremely dry callused fissured areas. He does not yet have [name] dermatology appointment 2/17; left heel remains closed. He has [name] new open area on the left buttock. The area on the left lateral calf is bigger longer and still covered in necrotic debris. No major change in his foot areas  bilaterally. I am awaiting for [name] dermatologist to look on this. We have been using ketoconazole I do not know that this is been doing any good at all. 2/24; left heel remains closed. The left buttock wound that was new reopening last week looks better. The left lateral calf appears better also although still requires debridement. The major area on his foot is the left first second also requiring debridement. We have been putting Prisma on all wounds. I do not believe that the ketoconazole has done too much good for his feet. He will use Lotrisone I am going to give him [name] 2-week course of terbinafine. We still do not have [name] dermatology appointment 3/2 left heel remains closed however there is skin over bone in this area I pointed this out to him today. The left buttock wound is epithelialized but still does not look completely stable. The area on the left leg required debridement were using silver collagen here. With regards to his feet we changed to Lotrisone last week and silver alginate. 3/9; left heel remains closed. Left buttock remains closed. The area on the right foot is essentially closed. The left foot remains unchanged. Slightly smaller on the left lateral calf. Using silver collagen to both of these areas 3/16-Left heel remains closed. Area on right foot is closed. Left lateral calf above the lateral malleolus open wound requiring debridement with easy bleeding. Left dorsal wound proximal to first toe also debrided. Left ischial area open new. Patient has been using Prisma with wrapping every 3 days. Dermatology appointment is apparently tomorrow.Patient has completed his terbinafine 2-week course with some apparent improvement according to him, there is still flaking and dry skin in his foot on the left 3/23; area on the right foot is reopened. The area on the left anterior foot is about the same still [name] very necrotic adherent surface. He still has the area on the left leg and reopening  is on the left buttock. He apparently saw dermatology although I do not have [name] note. According to the patient who is usually fairly well informed they did not have any good ideas. Put him on oral terbinafine which she is been on before. 3/30; using silver collagen to all wounds. Apparently his dermatologist put him on doxycycline and rifampin presumably some culture grew staph. I do not have this result. He remains on terbinafine although I have used terbinafine on him before 4/6; patient has had [name] fairly substantial reopening on the right foot between the first and second toes. He is finished his terbinafine and I believe is on doxycycline and rifampin still as prescribed by dermatology. We have been using silver collagen to all his wounds although the patient reports that he thinks silver alginate does better on the  wounds on his buttock. 4/13; the area on his left lateral calf about the same size but it did not require debridement. Left dorsal foot just proximal to the webspace between the first and second toes is about the same. Still nonviable surface. I note some superficial bronze discoloration of the dorsal part of his foot Right dorsal foot just proximal to the first and second toes also looks about the same. I still think there may be the same discoloration I noted above on the left Left buttock wound looks about the same 4/20; left lateral calf appears to be gradually contracting using silver collagen. 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 The left dorsal foot wound is debrided of tightly adherent necrotic debris and really cleans up quite nicely. The right area is worse with expansion. I did not debride this it is now over the base of the second toe The area on his left buttock is smaller no debridement is required using silver collagen 5/4; left calf continues to make good progress. He arrives with erythema around the wounds  on his dorsal foot which even extends to the plantar aspect. Very concerning for coexistent infection. He is finished the erythromycin I gave him for possible erythrasma this does not seem to have helped. The area on the left foot is about the same base of the dorsal toes Is area on the buttock looks improved on the left 5/11; left calf and left buttock continued to make good progress. Left foot is about the same to slightly improved. Major problem is on the right foot. He has not had an x-ray. Deep tissue culture I did last week showed both Enterobacter and E. coli. I did not change the doxycycline I put him on empirically although neither 1 of these were plated to doxycycline. He arrives today with the erythema looking worse on both the dorsal and plantar foot. Macerated skin on the bottom of the foot. he has not been systemically unwell 5/18-Patient returns at 1 week, left calf wound appears to be making some progress, left buttock wound appears slightly worse than last time, left foot wound looks slightly better, right foot redness is marginally better. X-ray of both feet show no air or evidence of osteomyelitis. Patient is finished his Omnicef and terbinafine. He continues to have macerated skin on the bottom of the left foot as well as right 5/26; left calf wound is better, left buttock wound appears to have multiple small superficial open areas with surrounding macerated skin. X-rays that I did last time showed no evidence of osteomyelitis in either foot. He is finished cefdinir and doxycycline. I do not think that he was on terbinafine. He continues to have [name] large superficial open area on the right foot anterior dorsal and slightly between the first and second toes. I did send him to dermatology 2 months ago or so wondering about whether they would do [name] fungal scraping. I do not believe they did but did do [name] culture. We have been using silver alginate to the toe areas, he has been using  antifungals at home topically either ketoconazole or Lotrisone. We are using silver collagen on the left foot, silver alginate on the right, silver collagen on the left lateral leg and silver alginate on the left buttock 6/1; left buttock area is healed. We have the left dorsal foot, left lateral leg and right dorsal foot. We are using silver alginate to the areas on both feet and  silver collagen to the area on his left lateral calf 6/8; the left buttock apparently reopened late last week. He is not really sure how this happened. He is tolerating the terbinafine. Using silver alginate to all wounds 6/15; left buttock wound is larger than last week but still superficial. Came in the clinic today with [name] report of purulence from the left lateral leg I did not identify any infection Both areas on his dorsal feet appear to be better. He is tolerating the terbinafine. Using silver alginate to all wounds 6/22; left buttock is about the same this week, left calf quite [name] bit better. His left foot is about the same however he comes in with erythema and warmth in the right forefoot once again. Culture that I gave him in the beginning of May showed Enterobacter and E. coli. I gave him doxycycline and things seem to improve although neither 1 of these organisms was specifically plated. 6/29; left buttock is larger and dry this week. Left lateral calf looks to me to be improved. Left dorsal foot also somewhat improved right foot completely unchanged. The erythema on the right foot is still present. He is completing the Ceftin dinner that I gave him empirically [see discussion above.) 7/6 - All wounds look to be stable and perhaps improved, the left buttock wound is slightly smaller, per patient bleeds easily, completed ceftin, the right foot redness is less, he is on terbinafine 7/13; left buttock wound about the same perhaps slightly narrower. Area on the left lateral leg continues to narrow. Left dorsal foot  slightly smaller right foot about the same. We are using silver alginate on the right foot and Hydrofera Blue to the areas on the left. Unna boot on the left 2 layer compression on the right 7/20; left buttock wound absolutely the same. Area on lateral leg continues to get better. Left dorsal foot require debridement as did the right no major change in the 7/27; left buttock wound the same size necrotic debris over the surface. The area on the lateral leg is closed once again. His left foot looks better right foot about the same although there is some involvement now of the posterior first second toe area. He is still on terbinafine which I have given him for [name] month, not certain [name] centimeter major change 06/25/19-All wounds appear to be slightly improved according to report, left buttock wound looks clean, both foot wounds have minimal to no debris the right dorsal foot has minimal slough. We are using Hydrofera Blue to the left and silver alginate to the right foot and ischial wound. 8/10-Wounds all appear to be around the same, the right forefoot distal part has some redness which was not there before, however the wound looks clean and small. Ischial wound looks about the same with no changes 8/17; his wound on the left lateral calf which was his original chronic venous insufficiency wound remains closed. Since I last saw him the areas on the left dorsal foot right dorsal foot generally appear better but require debridement. The area on his left initial tuberosity appears somewhat larger to me perhaps hyper granulated and bleeds very easily. We have been using Hydrofera Blue to the left dorsal foot and silver alginate to everything else 8/24; left lateral calf remains closed. The areas on his dorsal feet on the webspace of the first and second toes bilaterally both look better. The area on the left buttock which is the pressure ulcer stage II slightly smaller. I change  the dressing to Hydrofera Blue  to all areas 8/31; left lateral calf remains closed. The area on his dorsal feet bilaterally look better. Using Hydrofera Blue. Still requiring debridement on the left foot. No change in the left buttock pressure ulcers however 9/14; left lateral calf remains closed. Dorsal feet look quite [name] bit better than 2 [name] ago. Flaking dry skin also [name] lot better with the ammonium lactate I gave him 2 [name] ago. The area on the left buttock is improved. He states that his Roho cushion developed [name] leak and he is getting [name] new one, in the interim he is offloading this vigorously 9/21; left calf remains closed. Left heel which was [name] possible DTI looks better this week. He had macerated tissue around the left dorsal foot right foot looks satisfactory and improved left buttock wound. I changed his dressings to his feet to silver alginate bilaterally. Continuing Hydrofera Blue on the left buttock. 9/28 left calf remains closed. Left heel did not develop anything [possible DTI] dry flaking skin on the left dorsal foot. Right foot looks satisfactory. Improved left buttock wound. We are using silver alginate on his feet Hydrofera Blue on the buttock. I have asked him to go back to the Lotrisone on his feet including the wounds and surrounding areas 10/5; left calf remains closed. The areas on the left and right feet about the same. [name] lot of this is epithelialized however debris over the remaining open areas. He is using Lotrisone and silver alginate. The area on the left buttock using Hydrofera Blue 10/26. Patient has been out for 3 [name] secondary to Covid concerns. He tested negative but I think his wife tested positive. He comes in today with the left foot substantially worse, right foot about the same. Even more concerning he states that the area on his left buttock closed over but then reopened and is considerably deeper in one aspect than it was before [stage III wound] 11/2; left foot really about the same as  last week. Quarter sized wound on the dorsal foot just proximal to the first second toes. Surrounding erythema with areas of denuded epithelium. This is not really much different looking. Did not look like cellulitis this time however. Right foot area about the same.. We have been using silver alginate alginate on his toes Left buttock still substantial irritated skin around the wound which I think looks somewhat better. We have been using Hydrofera Blue here. 11/9; left foot larger than last week and [name] very necrotic surface. Right foot I think is about the same perhaps slightly smaller. Debris around the circumference also addressed. Unfortunately on the left buttock there is been [name] decline. Satellite lesions below the major wound distally and now [name] an additional one posteriorly we have been using Hydrofera Blue but I think this is [name] pressure issue 11/16; left foot ulcer dorsally again [name] very adherent necrotic surface. Right foot is about the same. Not much change in the pressure ulcer on his left buttock. 11/30; left foot ulcer dorsally basically the same as when I saw him 2 [name] ago. Very adherent fibrinous debris on the wound surface. Patient reports [name] lot of drainage as well. 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 We have been using Iodoflex, patient changed back to alginate because of the drainage. Area on his right dorsal foot really looks benign with [name] healthier surface certainly [name] lot better than on the left. Left buttock wounds all  improved using Hydrofera Blue 12/7; left dorsal foot again no improvement. Tightly adherent debris. PCR culture I did last week only showed likely skin contaminant. I have gone ahead and done [name] punch biopsy of this which is about the last thing in terms of investigations I can think to do. He has known venous insufficiency and venous hypertension and this could be the issue here. The area on the right foot is  about the same left buttock slightly worse according to our intake nurse secondary to [facility] Blue sticking to the wound 12/14; biopsy of the left foot that I did last time showed changes that could be related to wound healing/chronic stasis dermatitis phenomenon no neoplasm. We have been using silver alginate to both feet. I change the one on the left today to Sorbact and silver alginate to his other 2 wounds 12/28; the patient arrives with the following problems; Major issue is the dorsal left foot which continues to be [name] larger deeper wound area. Still with [name] completely nonviable surface Paradoxically the area mirror image on the right on the right dorsal foot appears to be getting better. He had some loss of dry denuded skin from the lower part of his original wound on the left lateral calf. Some of this area looked [name] little vulnerable and for this reason we put him in wrap that on this side this week The area on his left buttock is larger. He still has the erythematous circular area which I think is [name] combination of pressure, sweat. This does not look like cellulitis or fungal dermatitis 11/26/2019; -Dorsal left foot large open wound with depth. Still debris over the surface. Using Sorbact The area on the dorsal right foot paradoxically has closed over He has [name] reopening on the left ankle laterally at the base of his original wound that extended up into the calf. This appears clean. The left buttock wound is smaller but with very adherent necrotic debris over the surface. We have been using silver alginate here as well The patient had arterial studies done in 2017. He had biphasic waveforms at the dorsalis pedis and posterior tibial bilaterally. ABI in the left was 1.17. Digit waveforms were dampened. He has slight spasticity in the great toes I do not think [name] TBI would be possible 1/11; the patient comes in today with [name] sizable reopening between the first and second toes on the right. This is  not exactly in the same location where we have been treating wounds previously. According to our intake nurse this was actually fairly deep but 0.6 cm. The area on the left dorsal foot looks about the same the surface is somewhat cleaner using Sorbact, his MRI is in 2 days. We have not managed yet to get arterial studies. The new reopening on the left lateral calf looks somewhat better using alginate. The left buttock wound is about the same using alginate 1/18; the patient had his ARTERIAL studies which were quite normal. ABI in the right at 1.13 with triphasic/biphasic waveforms on the left ABI 1.06 again with triphasic/biphasic waveforms. It would not have been possible to have done [name] toe brachial index because of spasticity. We have been using Sorbac to the left foot alginate to the rest of his wounds on the right foot left lateral calf and left buttock 1/25; arrives in clinic with erythema and swelling of the left forefoot worse over the first MTP area. This extends laterally dorsally and but also posteriorly. Still has an area on  the left lateral part of the lower part of his calf wound it is eschared and clearly not closed. Area on the left buttock still with surrounding irritation and erythema. Right foot surface wound dorsally. The area between the right and first and second toes appears better. 2/1; The left foot wound is about the same. Erythema slightly better I gave him [name] week of doxycycline empirically Right foot wound is more extensive extending between the toes to the plantar surface Left lateral calf really no open surface on the inferior part of his original wound however the entire area still looks vulnerable Absolutely no improvement in the left buttock wound required debridement. 2/8; the left foot is about the same. Erythema is slightly improved I gave him clindamycin last week. Right foot looks better he is using Lotrimin and silver alginate He has [name] breakdown in the left  lateral calf. Denuded epithelium which I have removed Left buttock about the same were using Hydrofera Blue 2/15; left foot is about the same there is less surrounding erythema. Surface still has tightly adherent debris which I have debriding however not making any progress Right foot has [name] substantial wound on the medial right second toe between the first and second webspace. Still an open area on the left lateral calf distal area. Buttock wound is about the same 2/22; left foot is about the same less surrounding erythema. Surface has adherent debris. Polymen Ag Right foot area significant wound between the first and second toes. We have been using silver alginate here Left lateral leg polymen Ag at the base of his original venous insufficiency wound Left buttock some improvement here 3/1; Right foot is deteriorating in the first second toe webspace. Larger and more substantial. We have been using silver alginate. Left dorsal foot about the same markedly adherent surface debris using PolyMem Ag Left lateral calf surface debris using PolyMem AG Left buttock is improved again using PolyMem Ag. He is completing his terbinafine. The erythema in the foot seems better. He has been on this for 2 [name] 3/8; no improvement in any wound area in fact he has [name] small open area on the dorsal midfoot which is new this week. He has not gotten his foot x-rays yet 3/15; his x-rays were both negative for osteomyelitis of both feet. No major change in any of his wounds on the extremities however his buttock wounds are better. We have been using polymen on the buttocks, left lower leg. Iodoflex on the left foot and silver alginate on the right 3/22; arrives in clinic today with the 2 major issues are the improvement in the left dorsal foot wound which for once actually looks healthy with [name] nice healthy wound surface without debridement. Using Iodoflex here. Unfortunately on the left lateral calf which is in the  distal part of his original wound he came to the clinic here for there was purulent drainage noted some increased breakdown scattered around the original area and [name] small area proximally. We we are using polymen here will change to silver alginate today. His buttock wound on the left is better and I think the area on the right first second toe webspace is also improved 3/29; left dorsal foot looks better. Using Iodoflex. Left ankle culture from deterioration last time grew E. coli, Enterobacter and Enterococcus. I will give him [name] course of cefdinir although that will not cover Enterococcus. The area on the right foot in the webspace of the first and second toe lateral first toe  looks better. The area on his buttock is about healed Vascular appointment is on April 21. This is to look at his venous system vis--vis continued breakdown of the wounds on the left including the left lateral leg and left dorsal foot he. He has had previous ablations on this side 4/5; the area between the right first and second toes lateral aspect of the first toe looks better. Dorsal aspect of the left first toe on the left foot also improved. Unfortunately the left lateral lower leg is larger and there is [name] second satellite wound superiorly. The usual superficial abrasions on the left buttock overall better but certainly not closed 4/12; the area between the right first and second toes is improved. Dorsal aspect of the left foot also slightly smaller with [name] vibrant healthy looking surface. No real change in the left lateral leg and the left buttock wound is healed He has an unaffordable co-pay for Apligraf. Appointment with vein and vascular with regards to the left leg venous part of the circulation is on 4/21 4/19; we continue to see improvement in all wound areas. Although this is minor. He has his vascular appointment on 4/21. The area on the left buttock has not reopened although right in the center of this area the  skin looks somewhat threatened 4/26; the left buttock is unfortunately reopened. In general his left dorsal foot has [name] healthy surface and looks somewhat smaller although it was not measured as such. The area between his first and second toe webspace on the right as [name] small wound against the first toe. The patient saw vascular surgery. The real question I was asking was about the small saphenous vein on the left. He has previously ablated left greater saphenous vein. Nothing further was commented on on the left. Right greater saphenous vein without reflux at the saphenofemoral junction or proximal thigh there was no indication for ablation of the right greater saphenous vein duplex was negative for DVT bilaterally. They did not think there was anything from [name] point of view that could be offered. They ABIs within normal limits 5/3; only small open area on the left buttock. The area on the left lateral leg which was his original venous reflux is now 2 wounds both which look clean. We are using Iodoflex on the left dorsal foot which looks healthy and smaller. He is down to [name] very tiny area between the right first and second toes, using silver alginate 5/10; all of his wounds appear better. We have much better edema control in 4 layer compression on the left. This may be the factor that is allowing the left foot and left lateral calf to heal. He has external compression garments at home 04/14/20-All of his wounds are progressing well, the left forefoot is practically closed, left ischium appears to be about the same, right toe webspace is also smaller. The left lateral leg is about the same, continue using Hydrofera Blue to this, silver alginate to the ischium, Iodoflex to the toe space on the right 6/7; most of his wounds outside of the left buttock are doing well. The area on the left lateral calf and left dorsal foot are smaller. The area on the right foot in between the first and  second toe webspace is barely visible although he still says there is some drainage here is the only reason I did not heal this out. Unfortunately the area on the left buttock almost looks like he has [name] skin tear  from tape. He has open wound and then [name] large flap of skin that we are trying to get adherence over an area just next to the remaining wound 6/21; 2 week follow-up. I believe is been here for nurse visits. Miraculously the area between his first and second toes on the left dorsal foot is closed over. Still open on the right first second web space. The left lateral calf has 2 open areas. Distally this is more superficial. The proximal area had [name] little more depth and required debridement of adherent necrotic material. His buttock wound is actually larger we have been using silver alginate here 6/28; the patient's area on the left foot remains closed. Still open wet area between the first and second toes on the right and also extending into the plantar aspect. We have been using silver alginate in this location. He has 2 areas on the left lower leg part of his original long wounds which I think are better. We have been using Hydrofera Blue here. Hydrofera Blue to the left buttock which is stable 7/12; left foot remains closed. Left ankle is closed. May be [name] small area between his right first and second toes the only truly open area is on the left buttock. We have been using Hydrofera Blue here 7/19; patient arrives with marked deterioration especially in the left foot and ankle. We did not put him in [name] compression wrap on the left last week in fact he wore his juxta lite stockings on either side although he does not have an underlying stocking. He has [name] reopening on the left dorsal foot, left lateral ankle and [name] new area on the right dorsal ankle. More worrisome is the degree of erythema on the left foot extending on the lateral foot into the lateral lower leg on the left 7/26; the patient had  erythema and drainage from the lateral left ankle last week. Culture of this grew MRSA resistant to doxycycline and clindamycin which are the 2 antibiotics we usually use with this patient who has multiple antibiotic allergies including linezolid, trimethoprim sulfamethoxazole. I had give him an empiric doxycycline and he comes in the area certainly looks somewhat better although it is blotchy in his lower leg. He has not been systemically unwell. He has had areas on the left dorsal foot which is [name] reopening, chronic wounds on the left lateral ankle. Both of these I think are secondary to chronic venous insufficiency. The area between his first and second toes is closed as far as I can tell. He had [name] new wrap injury on the right dorsal ankle last week. Finally he has an area on the left buttock. We have been using silver alginate to everything except the left buttock we are using Hydrofera Blue 06/30/20-Patient returns at 1 week, has been given [name] sample dose pack of NUZYRA which is [name] tetracycline derivative [omadacycline], patient has completed those, we have been using silver alginate to almost all the wounds except the left ischium where we are using Hydrofera Blue all of them look better 8/16; since I last saw the patient he has been doing well. The area on the left buttock, left lateral ankle and left foot are all closed today. He has completed the [place] I gave him last time and tolerated this well. He still has open areas on the right dorsal ankle and in the right first second toe area which we are using silver alginate. 8/23; we put him in his bilateral external compression stockings  last week as he did not have anything open on either leg except for concerning area between the right first and second toe. He comes in today with an area on the left dorsal foot slightly more proximal than the original wound, the left lateral foot but this is actually [name] continuation of the area he had on the left  lateral ankle from last time. As well he is opened up on the left buttock again. 8/30; comes in today with things looking [name] lot better. The area on the left lower ankle has closed down as has the left foot but with eschar in both areas. The area on the dorsal right ankle is also epithelialized. Very little remaining of the left buttock wound. We have been using silver alginate on all wound areas 9/13; the area in the first second toe webspace on the right has fully epithelialized. He still has some vulnerable epithelium on the right and the ankle and the dorsal foot. He notes weeping. He is using his juxta lite stocking. On the left again the left dorsal foot is closed left lateral ankle is closed. We went to the juxta lite stocking here as well. Still vulnerable in the left buttock although only 2 small open areas remain here 9/27; 2-week follow-up. We did not look at his left leg but the patient says everything is closed. He is [name] bit disturbed by the amount of edema in his left foot he is using juxta lite stockings but asking about over the toes stockings which would be 30/40, will talk to him next time. According to him there is no open wound on either the left foot or the left ankle/calf He has an open area on the dorsal right calf which I initially point [name] wrap injury. He has superficial remaining wound on the left ischial tuberosity been using silver alginate although he says this sticks to the wound 10/5; we gave him 2-week follow-up but he called yesterday expressing some concerns about his right foot right ankle and the left buttock. He came in early. There is still no open areas on the left leg and that still in his juxta lite stocking 10/11; he only has 1 small area on the left buttock that remains measuring millimeters 1 mm. Still has the same irritated skin in this area. We recommended zinc oxide when this eventually closes and pressure relief is meticulously is he can do this. He still  has an area on the dorsal part of his right first through third toes which is [name] bit irritated and still open and on the dorsal ankle near the crease of the ankle. We have been using silver alginate and using his own stocking. He has nothing open on the left leg or foot 10/25; 2-week follow-up. Not nearly as good on the left buttock as I was hoping. For open areas with 5 looking threatened small. He has the erythematous irritated chronic skin in this area. 1 area on the right dorsal ankle. He reports this area bleeds easily Right dorsal foot just proximal to the base of his toes We have been using silver alginate. 11/8; 2-week follow-up. Left buttock is about the same although I do not think the wounds are in the same location we have been using silver alginate. I have asked him to use zinc oxide on the skin around the wounds. He still has [name] small area on the right dorsal ankle he reports this bleeds easily Right dorsal foot just proximal to the  base of the toes does not have anything open although the skin is very dry and scaly He has [name] new opening on the nailbed of the left great toe. Nothing on the left ankle 11/29; 3-week follow-up. Left buttock has 2 open areas. And washing of these wounds today started bleeding easily. Suggesting very friable tissue. We have been using silver alginate. Right dorsal ankle which I thought was initially [name] wrap injury we have been using silver alginate. Nothing open between the toes that I can see. He states the area on the left dorsal toe nailbed healed after the last visit in 2 or 3 days 12/13; 3-week follow-up. His left buttock now has 3 open areas but the original 2 areas are smaller using polymen here. Surrounding skin looks better. The right dorsal ankle is closed. He has [name] small opening on the right dorsal foot at the level of the third toe. In general the skin looks better here. He is wearing his juxta lite stocking on the left leg says there is nothing  open 11/24/2020; 3 [name] follow-up. His left buttock still has the 3 open areas. We have been using polymen but due to lack of response he changed to [facility] area. Surrounding skin is dry erythematous and irritated looking. There is no evidence of infection either bacterial or fungal however there is loss of surface epithelium He still has very dry skin in his foot causing irritation and erythema on the dorsal part of his toes. This is not responded to prolonged courses of antifungal simply looks dry and irritated 1/24; left buttock area still looks about the same he was unable to find the triad ointment that we had suggested. The area on the right lower leg just above the dorsal ankle has reopened and the areas on the right foot between the first second and second third toes and scaling on the bottom of the foot has been about the same for quite some time now. been using silver alginate to all wound areas 2/7; left buttock wound looked quite good although not much smaller in terms of surface area surrounding skin looks better. Only [name] few dry flaking areas on the right foot in between the first and second toes the skin generally looks better here [ammonium lactate]. Finally the area on the right dorsal ankle is closed 2/21; There is no open area on the right foot even between the right first and second toe. Skin around this area dorsally and plantar aspects look better. He has [name] reopening of the area on the right ankle just above the crease of the ankle dorsally. I continue to think that this is probably friction from spasms may be even this time with his stocking under the compression stockings. Wounds on his left buttock look about the same there [name] couple of areas that have reopened. He has [name] total square area of loss of epithelialization. This does not look like infection it looks like [name] contact dermatitis but I just cannot determine to what 3/14; there is nothing on the right foot between  the first and second toes this was carefully inspected under illumination. Some chronic irritation on the dorsal part of his foot from toes 1-3 at the base. Nothing really open here substantially. Still has an area on the right foot/ankle that is actually larger and hyper granulated. His buttock area on the left is just about closed however he has chronic inflammation with loss of the surface epithelial layer 3/28; 2-week follow-up. In  clinic today with [name] new wound on the left anterior mid tibia. Says this happened about 2 [name] ago. He is not really sure how wonders about the spasticity of his legs at night whether that could have caused this other than that he does not have [name] good idea. He has been using topical antibiotics and silver alginate. The area on his right dorsal ankle seems somewhat better. Finally everything on his left buttock is closed. 4/11; 2-week follow-up. All of his wounds are better except for the area over the ischium and left buttock which have opened up widely again. At least part of this is covered in necrotic fibrinous material another part had rolled nonviable skin. The area on the right ankle, left anterior mid tibia are both [name] lot better. He had no open wounds on either foot including the areas between the first and second toes 4/25; patient presents for 2-week follow-up. He states that the wounds are overall stable. He has no complaints today and states he is using Hydrofera Blue to open wounds. 5/9; have not seen this man in over [name] month. For my memory he has open areas on the left mid tibia and right ankle. T oday he has new open area on the right dorsal foot which we have not had [name] problem with recently. He has the sustained area on the left buttock He is also changed his insurance at the beginning of the year [employer]. We will need prior authorizations for debridement 5/23; patient presents for 2-week follow-up. He has prior authorizations for debridement. He  denies any issues in the past 2 [name] with his wound care. He has been using Hydrofera Blue to all the wounds. He does report [name] circular rash to the upper left leg that is new. He denies acute signs of infection. 6/6; 2-week follow-up. The patient has open wounds on the left buttock which are worse than the last time I saw this about [name] month ago. He also has [name] new area to me on the left anterior mid tibia with some surrounding erythema. The area on the dorsal ankle on the right is closed but I think this will be [name] friction injury every time this area is exposed to either our wraps or his compression stockings caused by unrelenting spasms in this leg. 6/20; 2-week follow-up. The patient has open wounds on the left buttock which is about the same. Using [facility] here. - The left mid tibia has [name] static amount of surrounding erythema. Also [name] raised area in the center. We have been using Hydrofera Blue here. Finally he has broken down in his dorsal right foot extending between the first and second toes and going to the base of the first and second toe webspace. I have previously assumed that this was severe venous hypertension 7/5; 2-week follow-up The left buttock wound actually looks better. We are using Hydrofera Blue. He has extensive skin irritation around this area and I have not really been able to get that any better. I have tried Lotrisone i.e. antifungals and steroids. More most recently we have just been using Coloplast really looks about the same. The left mid tibia which was new last week culture to have very resistant staph aureus. Not only methicillin-resistant but doxycycline resistant. The patient has [name] plethora of antibiotic allergies including sulfa, linezolid. I used topical bacitracin on this but he has not started this yet. In addition he has an expanding area of erythema with [name] wound on the  dorsal right foot. I did [name] deep tissue culture of this area today 7/12; Left buttock  area actually looks better surrounding skin also looks less irritated. Left mid tibia looks about the same. He is using bacitracin this is not worse Right dorsal foot looks about the same as well. The left first toe also looks about the same 7/19; left buttock wound continues to improve in terms of open areas Left mid tibia is still concerning amount of swelling he is using bacitracin Dorsal left first toe somewhat smaller Right dorsal foot somewhat smaller 7/25; left buttock wound actually continues to improve Left mid tibia area has less swelling. I gave him all my samples of new Nuzyra. This seems to have helped although the wound is still open it. His abrasion closed by here Left dorsal great toe really no better. Still [name] very nonviable surface Right dorsal foot perhaps some better. We have been using bacitracin and silver nitrate to the areas on his lower legs and Hydrofera Blue to the area on the buttock. 8/16 Disappointed that his left buttock wound is actually more substantial. Apparently during the last nurse visit these were both very small. He has continued irritation to [name] large area of skin on his buttock. I have never been able to totally explain this although I think it some combination of the way he sits, pressure, moisture. He is not incontinent enough to contribute to this. Left dorsal great toe still fibrinous debris on the surface that I have debrided today Large area across the dorsal right toes. The area on the left anterior mid tibia has less swelling. He completed the [place]. This does not look infected although the tissue is still fried 8/30; 2-week follow-up. Left buttock areas not improved. We used Hydrofera Blue on this. Weeping wet with the surrounding erythema that I have not been able to control even with Lotrisone and topical Coloplast Left dorsal great toe looks about the same More substantial area again at the base of his toes on the left which is new this  week. Area across the dorsal right toes looks improved The left anterior mid tibia looks like it is trying to close 9/13; 2-week follow-up. Using silver alginate on all of his wounds. The left dorsal foot does not look any better. He has the area on the dorsal toe and also the areas at the base of all of his toes 1 through 3. On the right foot he has [name] similar pattern in [name] similar area. He has the area on his left mid tibia that looks fairly healthy. Finally the area on his left buttock looks somewhat bette 9/20; culture I did of the left foot which was [name] deep tissue culture last time showed E. coli he has erythema around this wound. Still [name] completely necrotic surface. His right dorsal foot looks about the same. He has [name] very friable surface to the left anterior mid tibia. Both buttock wounds look better. We have been using silver alginate to all wounds 10/4; he has completed the cephalexin that I gave him last time for the left foot. He is using topical gentamicin under silver alginate silver alginate being applied to all the wounds. Unfortunately all the wounds look irritated on his dorsal right foot dorsal left foot left mid tibia. I wonder if this could be [name] silver allergy. I am going to change him to [name] on the lower extremity. The skin on the left buttock and left posterior thigh still flaking  dry and irritated. This has continued no matter what I have applied topically to this. He has [name] solitary open wound which by itself does not look too bad however the entire area of surrounding skin does not change no matter what we have applied here 10/18; the area on the left dorsal foot and right dorsal foot both look better. The area on the right extends into the plantar but not between his toes. We have been using silver alginate. He still has [name] rectangular erythematous area around the area on the left tibia. The wound itself is very small. Finally everything on his left buttock looks [name]  little larger the skin is erythematous 11/15; patient comes in with the left dorsal and right dorsal foot distally looking somewhat better. Still nonviable surface on the left foot which required debridement. He still has the area on the left anterior mid tibia although this looks somewhat better. He has [name] new area on the right lateral lower leg just above the ankle. Finally his left buttock looks terrible with multiple superficial open wounds geometric square shaped area of chronic erythema which I have not been able to sort through 11/29; right dorsal foot and left dorsal foot both look somewhat better. No debridement required. He has the fragile area on the left anterior mid tibia this looks and continues to look somewhat better. Right lateral lower leg just above the ankle we identified last time also looks better. In general the area on his left buttock looks improved. We are using Hydrofera Blue to all wound areas 12/13; right dorsal foot looks better. The area on the right lateral leg is healed. Left dorsal foot has 2 open areas both of which require debridement. The fragile area on the left anterior mid tibial looks better. Smaller area on his buttocks. Were using Hydrofera Blue 1/10; patient comes in with everything looking slightly larger and/or worse. This includes his left buttock, reopening of the left mid tibia, larger areas on the left dorsal foot and what looks to be [name] cellulitis on the right dorsal and plantar foot. We have been using Hydrofera Blue on all wounds. 1/17; right dorsal foot distally looks better today. The left foot has 2 open wounds that are about the same surrounding erythema. 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 No malignancy they did not stain for fungus 1/24; his right distal foot is not closed dry and scaly but the wound looks  like it is contracting. I did not debride anything here. Problem on his left dorsal foot with expanding erythema. Apparently there were problems last week getting the Elesa Hacker however it is now available at the [facility] but [name] week later. He is using ketoconazole and Coloplast to the left buttock along with Hydrofera Blue this actually looks quite [name] bit better today. 1/31; right dorsal foot again is dry and scaly but looking to contract. He has been using [name] moisturizer on his feet at my request but he is not sure which 1. The left dorsal foot wounds look about the same there is erythema here that I marked last week however after course of Nuzyra it certainly is not any better but not any worse either. Finally on his left buttock the skin continues to look better he has the original wound but [name] new substantial area towards the gluteal cleft. Almost like [name] skin tear. I used  scissors to remove skin and subcutaneous tissue here silver nitrate and direct pressure 2/7; right dorsal foot. This does not look too much different from last week. Some erythema skin dry and scaly. No debridement. Left dorsal great toe again still not much improvement. I did remove flaking dry skin and callus from around the edge. Finally on his left buttock. The skin is somewhat better in the periwound. Surface wounds are superficial somewhat better than last week. 01/26/2022: Is [name] little bit of [name] mystery as to why his wounds fail to respond to treatments and actually seem to get worse. This is my first encounter with this patient. He was previously followed by Dr. Dellia Nims. Based upon my review of the chart, it seems that there is [name] little bit of [name] mystery as to why his wounds do not respond as anticipated to the interventions applied and sometimes even get worse. Biopsies have been performed and he was seen by dermatology in [place], but that did not shed any light on the matter. T oday, his gluteal wound is larger, with  substantial drainage, rather malodorous. The food wounds are not terrible, but he has [name] lot of callus and scaly skin around these. He is currently getting silver alginate on the gluteal wound, with idodoflex to the feet. He is using lotrisone on his legs for the dry, scaly skin. 02/09/2022: There has really been no change to any of his wounds. The gluteal wound less drainage and odor, but remains about the same size, the periwound skin remains oddly scaly. His lower extremity wounds also appear roughly the same size. They continue to accumulate [name] small amount of slough. The periwound on his feet and ankle wounds has dry eschar and loose dead skin. We have been using silver alginate on the gluteal wound and Iodoflex on his feet and ankle wounds. T the periwound around his gluteal lesion and Lotrisone on his feet and legs. o 02/23/2022: The right plantar foot wound is closed. The gluteal site looks small but has continued to produce hypertrophic granulation tissue. The foot wounds all look about the same on the dorsal surface of the right foot; on the left, there is only [name] small open area at the site of where his left great toenail would have been. 03/16/2022: The right ankle wound is healed. The right dorsal foot wound is about the same. The left dorsal foot wound is quite [name] bit smaller and the ischial wound is nearly closed. 03/30/2022: The right ankle wound reopened. Both dorsal foot wounds are quite [name] bit smaller. Unfortunately, he appears to have sheared part of his ischial wound open further, perhaps during [name] transfer. 04/13/2022: The right ankle wound has hypertrophic granulation tissue present. The dorsal foot wounds continue to decrease in size. The ischial wound looks about the same today, no better, no worse. 04/27/2022: The right ankle wound has closed. Unfortunately, it looks like some moisture got underneath the dry skin on both of his dorsal feet and these wounds have expanded in size. The  ischial wound remains the same with perhaps [name] little bit more slough accumulation than at our previous visit. 05/11/2022: The right ankle wound remains closed. There is [name] left anterior tibial wound that is small has patchy openings with accumulated slough. The dorsum of his right foot appears to be nearly healed with just [name] small punctate opening. The plantar surface of his right foot has [name] new opening that looks like he may have picked some skin there. His  sacral ulcer has hypertrophic granulation tissue but has some slough accumulation. The dorsum of his left foot has multiple open areas in [name] fairly ragged distribution. All of these have slough accumulated within them. 06/01/2022: The right ankle and left anterior tibial wound are both closed. Dorsum of his right foot and left foot both look substantially better with just tiny scattered openings The without any slough accumulation. He has sheared open new areas on his left gluteus and ischium. He says that his wheelchair cushion, which is air-filled, has [name] leak and so it keeps deflating. He is awaiting [name] new cushion. 06/15/2022: The right ankle wound has reopened and the fat layer is exposed. Both dorsal feet have just small openings with just [name] little bit of slough and eschar accumulation. The wound on his left gluteus and ischium is larger again today and has [name] foul odor. 06/29/2022: The right ankle wound has hypertrophic granulation tissue buildup. His dorsal foot wounds both look better with just some eschar on the surface. He has [name] new wound on his left lateral ankle. He is not sure how he acquired it but by appearance, it looks that he hit it on something, potentially his wheelchair or bed. The ischial wound is about the same but is cleaner without any significant purulent drainage or odor. He did not understand what the [facility] Hospital call was about and therefore he does not have the topical compounded antibiotic. Electronic Signature(s) Signed:  06/29/2022 9:41:10 AM By: [name] Maudlin MD FACS Entered By: [name] [name] on 06/29/2022 09:41:10 -------------------------------------------------------------------------------- Chemical Cauterization Details Patient Name: Date of Service: [name] [name] [name] 06/29/2022 8:15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 Left foot wound improved and the area on the dorsal right second toe is improved 08/23/16; the area on the left lateral leg may be slightly smaller both in terms of length and width. Aggressive debridement with [name] curette afterwards the tissue appears healthier. Left foot wound appears improved in the area on the dorsal right second toe is improved 08/30/16 patient developed [name] fever over the weekend and was seen in an urgent care. Felt to have [name] UTI and put on doxycycline. He has been since changed over the phone to [facility]. After we took off the wrap on his right leg today the leg is swollen warm and erythematous, probably more likely the source of the fever 09/06/16; have been using collagen to the major left leg wound, silver alginate to the area on his anterior foot/toes 09/13/16; the areas on his anterior foot/toes on both sides appear to be virtually closed. Extensive wound on the left lateral leg perhaps slightly narrower but each  visit still covered an adherent surface slough 09/16/16 patient was in for his usual Thursday nurse visit however the intake nurse noted significant erythema of his dorsal right foot. He is also running [name] low- grade fever and having increasing spasms in the right leg 09/20/16 here for cellulitis involving his right great toes and forefoot. This is [name] lot better. Still requiring debridement on his left lateral leg.  Santyl direct says he needs prior authorization. Therefore his wife cannot change this at home 09/30/16; the patient's extensive area on the left lateral calf and ankle perhaps somewhat better. Using Santyl. The area on the left toes is healed and I think the area on his right dorsal foot is healed as well. There is no cellulitis or venous inflammation involving the right leg. He is going to need compression stockings here. 10/07/16; the patient's extensive wound on the left lateral calf and ankle does not measure any differently however there appears to be less adherent surface slough using Santyl and aggressive weekly debridements 10/21/16; no major change in the area on the left lateral calf. Still the same measurement still very difficult to debridement adherent slough and nonviable subcutaneous tissue. This is not really been helped by several [name] of Santyl. Previously for 2 [name] I used Iodoflex for [name] short period. [name] prolonged course of Hydrofera Blue didn't really help. I'[name] not sure why I only used 2 [name] of Iodoflex on this there is no evidence of surrounding infection. He has [name] small area on the right second toe which looks as though it's progressing towards closure 10/28/16; the wounds on his toes appear to be closed. No major change in the left lateral leg wound although the surface looks somewhat better using Iodoflex. He has had previous arterial studies that were normal. He has had reflux studies and is status post ablation although I don't have any exact notes on which vein  was ablated. I'll need to check the surgical record 11/04/16; he's had [name] reopening between the first and second toe on the left and right. No major change in the left lateral leg wound. There is what appears to be cellulitis of the left dorsal foot 11/18/16 the patient was hospitalized initially in [place] and then subsequently transferred to [facility] long and was admitted there from 11/09/16 through 11/12/16. He had developed progressive cellulitis on the right leg in spite of the doxycycline I gave him. I'd spoken to the hospitalist in [place] who was concerned about continuing leukocytosis. CT scan is what I suggested this was done which showed soft tissue swelling without evidence of osteomyelitis or an underlying abscess blood cultures were negative. At [facility] he was treated with vancomycin and Primaxin and then add an infectious disease consult. He was transitioned to Ceftaroline. He has been making progressive improvement. Overall [name] severe cellulitis of the right leg. He is been using silver alginate to her original wound on the left leg. The wounds in his toes on the right are closed there is [name] small open area on the base of the left second toe 11/26/15; the patient's right leg is much better although there is still some edema here this could be reminiscent from his severe cellulitis likely on top of some degree of lymphedema. His left anterior leg wound has less surface slough as reported by her intake nurse. Small wound at the base of the left second toe 12/02/16; patient's right leg is better and there is no open wound here. His left anterior lateral leg wound continues to have [name] healthy-looking surface. Small wound at the base of the left second toe however there is erythema in the left forefoot which is worrisome 12/16/16; is no open wounds on his right leg. We took measurements for stockings. His left anterior lateral leg wound continues to have [name] healthy-looking surface. I'[name] not sure  where we were with the Apligraf run through his insurance. We  have been using Iodoflex. He has [name] thick eschar on the left first second toe interface, I suspect this may be fungal however there is no visible open 12/23/16; no open wound on his right leg. He has 2 small areas left of the linear wound that was remaining last week. We have been using Prisma, I thought I have disclosed this week, we can only look forward to next week 01/03/17; the patient had concerning areas of erythema last week, already on doxycycline for UTI through his primary doctor. The erythema is absolutely no better there is warmth and swelling both medially from the left lateral leg wound and also the dorsal left foot. 01/06/17- Patient is here for follow-up evaluation of his left lateral leg ulcer and bilateral feet ulcers. He is on oral antibiotic therapy, tolerating that. Nursing staff and the patient states that the erythema is improved from Monday. 01/13/17; the predominant left lateral leg wound continues to be problematic. I had put Apligraf on him earlier this month once. However he subsequently developed what appeared to be an intense cellulitis around the left lateral leg wound. I gave him Dalvance I think on 2/12 perhaps 2/13 he continues on cefdinir. The erythema is still present but the warmth and swelling is improved. I am hopeful that the cellulitis part of this control. I wouldn't be surprised if there is an element of venous inflammation as well. 01/17/17. The erythema is present but better in the left leg. His left lateral leg wound still does not have [name] viable surface buttons certain parts of this long thin wound it appears like there has been improvement in dimensions. 01/20/17; the erythema still present but much better in the left leg. I'[name] thinking this is his usual degree of chronic venous inflammation. The wound on the left leg looks somewhat better. Is less surface slough 01/27/17; erythema is back to the chronic  venous inflammation. The wound on the left leg is somewhat better. I am back to the point where I like to try an Apligraf once again 02/10/17; slight improvement in wound dimensions. Apligraf #2. He is completing his doxycycline 02/14/17; patient arrives today having completed doxycycline last Thursday. This was supposed to be [name] nurse visit however once again he hasn't tense erythema from the medial part of his wound extending over the lower leg. Also erythema in his foot this is roughly in the same distribution as last time. He has baseline chronic venous inflammation however this is [name] lot worse than the baseline I have learned to accept the on him is baseline inflammation 02/24/17- patient is here for follow-up evaluation. He is tolerating compression therapy. His voicing no complaints or concerns he is here anticipating an Apligraf 03/03/17; he arrives today with an adherent necrotic surface. I don't think this is surface is going to be amenable for Apligraf's. The erythema around his wound and on the left dorsal foot has resolved he is off antibiotics 03/10/17; better-looking surface today. I don't think he can tolerate Apligraf's. He tells me he had [name] wound VAC after [name] skin graft years ago to this area and they had difficulty with [name] seal. The erythema continues to be stable around this some degree of chronic venous inflammation but he also has recurrent cellulitis. We have been using Iodoflex 03/17/17; continued improvement in the surface and may be small changes in dimensions. Using Iodoflex which seems the only thing that will control his surface 03/24/17- He is here for follow up evaluation of his LLE  lateral ulceration and ulcer to right dorsal foot/toe space. He is voicing no complaints or concerns, He is tolerating compression wrap. 03/31/17 arrives today with [name] much healthier looking wound on the left lower extremity. We have been using Iodoflex for [name] prolonged period of time which has for the  first time prepared and adequate looking wound bed although we have not had much in the way of wound dimension improvement. He also has [name] small wound between the first and second toe on the right 04/07/17; arrives today with [name] healthy-looking wound bed and at least the top 50% of this wound appears to be now her. No debridement was required I have changed him to [facility] last week after prolonged Iodoflex. He did not do well with Apligraf's. We've had [name] re-opening between the first and second toe on the right 04/14/17; arrives today with [name] healthier looking wound bed contractions and the top 50% of this wound and some on the lesser 50%. Wound bed appears healthy. The area between the first and second toe on the right still remains problematic 04/21/17; continued very gradual improvement. Using [facility] 04/28/17; continued very gradual improvement in the left lateral leg venous insufficiency wound. His periwound erythema is very mild. We have been using Hydrofera Blue. Wound is making progress especially in the superior 50% 05/05/17; he continues to have very gradual improvement in the left lateral venous insufficiency wound. Both in terms with an length rings are improving. I debrided this every 2 [name] with #5 curet and we have been using Hydrofera Blue and again making good progress With regards to the wounds between his right first and second toe which I thought might of been tinea pedis he is not making as much progress very dry scaly skin over the area. Also the area at the base of the left first and second toe in [name] similar condition 05/12/17; continued gradual improvement in the refractory left lateral venous insufficiency wound on the left. Dimension smaller. Surface still requiring debridement using Hydrofera Blue 05/19/17; continued gradual improvement in the refractory left lateral venous ulceration. Careful inspection of the wound bed underlying rumination suggested some degree of  epithelialization over the surface no debridement indicated. Continue Hydrofera Blue difficult areas between his toes first and third on the left than first and second on the right. I'[name] going to change to silver alginate from silver collagen. Continue ketoconazole as I suspect underlying tinea pedis 05/26/17; left lateral leg venous insufficiency wound. We've been using Hydrofera Blue. I believe that there is expanding epithelialization over the surface of the wound albeit not coming from the wound circumference. This is [name] bit of an odd situation in which the epithelialization seems to be coming from the surface of the wound rather than in the exact circumference. There is still small open areas mostly along the lateral margin of the wound. ooHe has unchanged areas between the left first and second and the right first second toes which I been treating for tenia pedis 06/02/17; left lateral leg venous insufficiency wound. We have been using Hydrofera Blue. Somewhat smaller from the wound circumference. The surface of the wound remains [name] bit on it almost epithelialized sedation in appearance. I use an open curette today debridement in the surface of all of this especially the edges ooSmall open wounds remaining on the dorsal right first and second toe interspace and the plantar left first second toe and her face on the left 06/09/17; wound on the left lateral leg continues  to be smaller but very gradual and very dry surface using Hydrofera Blue 06/16/17 requires weekly debridements now on the left lateral leg although this continues to contract. I changed to silver collagen last week because of dryness of the wound bed. Using Iodoflex to the areas on his first and second toes/web space bilaterally 06/24/17; patient with history of paraplegia also chronic venous insufficiency with lymphedema. Has [name] very difficult wound on the left lateral leg. This has been gradually reducing in terms of with but comes in with  [name] very dry adherent surface. High switch to silver collagen [name] week or so ago with hydrogel to keep the area moist. This is been refractory to multiple dressing attempts. He also has areas in his first and second toes bilaterally in the anterior and posterior web space. I had been using Iodoflex here after [name] prolonged course of silver alginate with ketoconazole was ineffective [question tinea pedis] 07/14/17; patient arrives today with [name] very difficult adherent material over his left lateral lower leg wound. He also has surrounding erythema and poorly controlled edema. He was switched his Santyl last visit which the nurses are applying once during his doctor visit and once on [name] nurse visit. He was also reduced to 2 layer compression I'[name] not exactly sure of the issue here. 07/21/17; better surface today after 1 week of Iodoflex. Significant cellulitis that we treated last week also better. [Doxycycline] 07/28/17 better surface today with now 2 [name] of Iodoflex. Significant cellulitis treated with doxycycline. He has now completed the doxycycline and he is back to his usual degree of chronic venous inflammation/stasis dermatitis. He reminds me he has had ablations surgery here 08/04/17; continued improvement with Iodoflex to the left lateral leg wound in terms of the surface of the wound although the dimensions are better. 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 Problematic areas on the plantar aspect of the first second toe web space on the left and the dorsal aspect of the first second toe web space on the right. At one point I felt these were probably related to chronic fungal infections in treated him aggressively for this although we have not made any improvement here. 08/11/17; left lateral leg. Surface continues to improve with the Iodoflex although we are not seeing much improvement in overall wound dimensions. Areas on his  plantar left foot and right foot show no improvement. In fact the right foot looks somewhat worse 08/18/17; left lateral leg. We changed to [facility] Blue last week after [name] prolonged course of Iodoflex which helps get the surface better. It appears that the wound with is improved. Continue with difficult areas on the left dorsal first second and plantar first second on the right 09/01/17; patient arrives in clinic today having had [name] temperature of 103 yesterday. He was seen in the ER and [facility]. The patient was concerned he could have cellulitis again in the right leg however they diagnosed him with [name] UTI and he is now on Keflex. He has [name] history of cellulitis which is been recurrent and difficult but this is been in the left leg, in the past 5 use doxycycline. He does in and out catheterizations at home which are risk factors for UTI 09/08/17; patient will be completing his Keflex this weekend. The erythema on the left leg is considerably better. He has [name] new wound today on the medial part of the right leg small superficial almost looks like [name]  skin tear. He has worsening of the area on the right dorsal first and second toe. His major area on the left lateral leg is better. Using Hydrofera Blue on all areas 09/15/17; gradual reduction in width on the long wound in the left lateral leg. No debridement required. He also has wounds on the plantar aspect of his left first second toe web space and on the dorsal aspect of the right first second toe web space. 09/22/17; there continues to be very gradual improvements in the dimensions of the left lateral leg wound. He hasn't round erythematous spot with might be pressure on his wheelchair. There is no evidence obviously of infection no purulence no warmth ooHe has [name] dry scaled area on the plantar aspect of the left first second toe ooImproved area on the dorsal right first second toe. 09/29/17; left lateral leg wound continues to improve in dimensions  mostly with an is still [name] fairly long but increasingly narrow wound. ooHe has [name] dry scaled area on the plantar aspect of his left first second toe web space ooIncreasingly concerning area on the dorsal right first second toe. In fact I am concerned today about possible cellulitis around this wound. The areas extending up his second toe and although there is deformities here almost appears to abut on the nailbed. 10/06/17; left lateral leg wound continues to make very gradual progress. Tissue culture I did from the right first second toe dorsal foot last time grew MRSA and enterococcus which was vancomycin sensitive. This was not sensitive to clindamycin or doxycycline. He is allergic to Zyvox and sulfa we have therefore arrange for him to have dalvance infusion tomorrow. He is had this in the past and tolerated it well 10/20/17; left lateral leg wound continues to make decent progress. This is certainly reduced in terms of with there is advancing epithelialization.ooThe cellulitis in the right foot looks better although he still has [name] deep wound in the dorsal aspect of the first second toe web space. Plantar left first toe web space on the left I think is making some progress 10/27/17; left lateral leg wound continues to make decent progress. Advancing epithelialization.using Hydrofera Blue ooThe right first second toe web space wound is better-looking using silver alginate ooImprovement in the left plantar first second toe web space. Again using silver alginate 11/03/17 left lateral leg wound continues to make decent progress albeit slowly. Using Hydrofera Blue ooThe right per second toe web space continues to be [name] very problematic looking punched out wound. I obtained [name] piece of tissue for deep culture I did extensively treated this for fungus. It is difficult to imagine that this is [name] pressure area as the patient states other than going outside he doesn't really wear shoes at home ooThe  left plantar first second toe web space looked fairly senescent. Necrotic edges. This required debridement oochange to Hydrofera Blue to all wound areas 11/10/17; left lateral leg wound continues to contract. Using Hydrofera Blue ooOn the right dorsal first second toe web space dorsally. Culture I did of this area last week grew MRSA there is not an easy oral option in this patient was multiple antibiotic allergies or intolerances. This was only [name] rare culture isolate I'[name] therefore going to use Bactroban under silver alginate ooOn the left plantar first second toe web space. Debridement is required here. This is also unchanged 11/17/17; left lateral leg wound continues to contract using Hydrofera Blue this is no longer the major issue. ooThe major concern here  is the right first second toe web space. He now has an open area going from dorsally to the plantar aspect. There is now wound on the inner lateral part of the first toe. Not [name] very viable surface on this. There is erythema spreading medially into the forefoot. ooNo major change in the left first second toe plantar wound 11/24/17; left lateral leg wound continues to contract using Hydrofera Blue. Nice improvement today ooThe right first second toe web space all of this looks [name] lot less angry than last week. I have given him clindamycin and topical Bactroban for MRSA and terbinafine for the possibility of underlining tinea pedis that I could not control with ketoconazole. Looks somewhat better ooThe area on the plantar left first second toe web space is weeping with dried debris around the wound 12/01/17; left lateral leg wound continues to contract he Hydrofera Blue. It is becoming thinner in terms of with nevertheless it is making good improvement. ooThe right first second toe web space looks less angry but still [name] large necrotic-looking wounds starting on the plantar aspect of the right foot extending between the toes and now extensively  on the base of the right second toe. I gave him clindamycin and topical Bactroban for MRSA anterior benefiting for the possibility of underlying tinea pedis. Not looking better today ooThe area on the left first/second toe looks better. Debrided of necrotic debris 12/05/17* the patient was worked in urgently today because over the weekend he found blood on his incontinence bad when he woke up. He was found to have an ulcer by his wife who does most of his wound care. He came in today for [place] to look at this. He has not had [name] history of wounds in his buttocks in spite of his paraplegia. 12/08/17; seen in follow-up today at his usual appointment. He was seen earlier this week and found to have [name] new wound on his buttock. We also follow him for wounds on the left lateral leg, left first second toe web space and right first second toe web space 12/15/17; we have been using Hydrofera Blue to the left lateral leg which has improved. The right first second toe web space has also improved. Left first second toe web space plantar aspect looks stable. The left buttock has worsened using Santyl. Apparently the buttock has drainage 12/22/17; we have been using Hydrofera Blue to the left lateral leg which continues to improve now 2 small wounds separated by normal skin. He tells [place] he had [name] fever up to 100 yesterday he is prone to UTIs but has not noted anything different. He does in and out catheterizations. The area between the first and second toes today does not look good necrotic surface covered with what looks to be purulent drainage and erythema extending into the third toe. I had gotten this to something that I thought look better last time however it is not look good today. He also has [name] necrotic surface over the buttock wound which is expanded. I thought there might be infection under here so I removed [name] lot of the surface with [name] #5 curet though nothing look like it really needed culturing. He is been using  Santyl to this area 12/27/17; his original wound on the left lateral leg continues to improve using Hydrofera Blue. I gave him samples of Baxdella although he was unable to take them out of fear for an allergic reaction ["lump in his throat"].the culture I did of the purulent  drainage from his second toe last week showed both enterococcus and [name] set Enterobacter I was also concerned about the erythema on the bottom of his foot although paradoxically although this looks somewhat better today. 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 We've been using silver alginate here. They came up today that he sleeps in [name] recliner, I'[name] not sure why but I asked him to stop this 01/03/18; his original wound we've been using Hydrofera Blue is now separated into 2 areas. ooUlcer on his left buttock is better he is off the recliner and sleeping in bed ooFinally both wound areas between his first and second toes also looks some better 01/10/18; his original wound on the left lateral leg is now separated into 2 wounds we've been using Hydrofera Blue ooUlcer on his left buttock has some drainage. There is [name] small probing site going into muscle layer superiorly.using silver alginate -He arrives today with [name] deep tissue injury on the left heel ooThe wound on the dorsal aspect of his first second toe on the left looks [name] lot betterusing silver alginate ketoconazole ooThe area on the first second toe web space on the right also looks [name] lot bette 01/17/18; his original wound on the left lateral leg continues to progress using Hydrofera Blue ooUlcer on his left buttock also is smaller surface healthier except for [name] small probing site going into the muscle layer superiorly. 2.4 cm of tunneling in this area ooDTI on his left heel we have only been offloading. Looks better than last week no threatened open no evidence of  infection oothe wound on the dorsal aspect of the first second toe on the left continues to look like it's regressing we have only been using silver alginate and terbinafine orally ooThe area in the first second toe web space on the right also looks to be [name] lot better using silver alginate and terbinafine I think this was prompted by tinea pedis 01/31/18; the patient was hospitalized in [place] last week apparently for [name] complicated UTI. He was discharged on cefepime he does in and out catheterizations. In the hospital he was discovered [name] I don't mild elevation of AST and ALT and the terbinafine was stopped.predictably the pressure ulcer on s his buttock looks betterusing silver alginate. The area on the left lateral leg also is better using Hydrofera Blue. The area between the first and second toes on the left better. First and second toes on the right still substantial but better. Finally the DTI on the left heel has held together and looks like it's resolving 02/07/18-he is here in follow-up evaluation for multiple ulcerations. He has new injury to the lateral aspect of the last issue [name] pressure ulcer, he states this is from adhesive removal trauma. He states he has tried multiple adhesive products with no success. All other ulcers appear stable. The left heel DTI is resolving. We will continue with same treatment plan and follow-up next week. 02/14/18; follow-up for multiple areas. ooHe has [name] new area last week on the lateral aspect of his pressure ulcer more over the posterior trochanter. The original pressure ulcer looks quite stable has healthy granulation. 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 Almost fully epithelialized on the original superior area using Hydrofera Blue ooDTI on the left heel has peeled off this week  to reveal [name] small superficial wound under denuded skin and  subcutaneous tissue ooBoth areas between the first and second toes look better including nothing open on the left 02/21/18; ooThe patient's wounds on his left ischial tuberosity and posterior left greater trochanter actually looked better. He has [name] large area of irritation around the area which I think is contact dermatitis. I am doubtful that this is fungal ooHis original wound on the left lateral calf continues to improve we have been using Hydrofera Blue ooThere is no open area in the left first second toe web space although there is [name] lot of thick callus ooThe DTI on the left heel required debridement today of necrotic surface eschar and subcutaneous tissue using silver alginate ooFinally the area on the right first second toe webspace continues to contract using silver alginate and ketoconazole 02/28/18 ooLeft ischial tuberosity wounds look better using silver alginate. ooOriginal wound on the left calf only has one small open area left using Hydrofera Blue ooDTI on the left heel required debridement mostly removing skin from around this wound surface. Using silver alginate ooThe areas on the right first/second toe web space using silver alginate and ketoconazole 03/08/18 on evaluation today patient appears to be doing decently well as best I can tell in regard to his wounds. This is the first time that I have seen him as he generally is followed by Dr. Dellia Nims. With that being said none of his wounds appear to be infected he does have an area where there is some skin covering what appears to be [name] new wound on the left dorsal surface of his great toe. This is right at the nail bed. With that being said I do believe that debrided away some of the excess skin can be of benefit in this regard. Otherwise he has been tolerating the dressing changes without complication. 03/14/18; patient arrives today with the multiplicity of wounds that we are following. He has not been systemically  unwell ooOriginal wound on the left lateral calf now only has 2 small open areas we've been using Hydrofera Blue which should continue ooThe deep tissue injury on the left heel requires debridement today. We've been using silver alginate ooThe left first second toe and the right first second toe are both are reminiscence what I think was tinea pedis. Apparently some of the callus Surface between the toes was removed last week when it started draining. ooPurulent drainage coming from the wound on the ischial tuberosity on the left. 03/21/18-He is here in follow-up evaluation for multiple wounds. 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 He tolerated debridement. The only change to last week's recommendations is to discontinue antifungal cream between toes. He will follow-up next week 03/28/18; following up for multiple wounds;Concern this week is streaking redness and swelling in the right foot. He is going to need antibiotics for this. 03/31/18; follow-up for right foot cellulitis. Streaking redness and swelling in the right foot on 03/28/18. 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 I put him on clindamycin 300 mg every 6 and brought him in for [name] quick check. He has an open wound between his first and second toes on the right foot as [name] potential source. 04/04/18; ooRight foot cellulitis is resolving he is completing clindamycin. This is truly good news ooLeft lateral calf wound which is initial wound only has one small open area inferiorly this is close to healing out. He has  compression stockings. We will use Hydrofera Blue right down to the epithelialization of this ooNonviable surface on the left heel which was initially pressure with [name] DTI. We've been using Hydrofera Blue. I'[name] going to switch this back to silver alginate ooLeft first second toe/tinea pedis this looks better using silver  alginate ooRight first second toe tinea pedis using silver alginate ooLarge pressure ulcers on theLeft ischial tuberosity. Small wound here Looks better. I am uncertain about the surface over the large wound. Using silver alginate 04/11/18; ooCellulitis in the right foot is resolved ooLeft lateral calf wound which was his original wounds still has 2 tiny open areas remaining this is just about closed ooNonviable surface on the left heel is better but still requires debridement ooLeft first second toe/tinea pedis still open using silver alginate ooRight first second toe wound tinea pedis I asked him to go back to using ketoconazole and silver alginate ooLarge pressure ulcers on the left ischial tuberosity this shear injury here is resolved. Wound is smaller. No evidence of infection using silver alginate 04/18/18; ooPatient arrives with an intense area of cellulitis in the right mid lower calf extending into the right heel area. Bright red and warm. Smaller area on the left anterior leg. He has [name] significant history of MRSA. He will definitely need antibioticsoodoxycycline ooHe now has 2 open areas on the left ischial tuberosity the original large wound and now [name] satellite area which I think was above his initial satellite areas. Not [name] wonderful surface on this satellite area surrounding erythema which looks like pressure related. ooHis left lateral calf wound again his original wound is just about closed ooLeft heel pressure injury still requiring debridement ooLeft first second toe looks [name] lot better using silver alginate ooRight first second toe also using silver alginate and ketoconazole cream also looks better 04/20/18; the patient was worked in early today out of concerns with his cellulitis on the right leg. I had started him on doxycycline. This was 2 days ago. His wife was concerned about the swelling in the area. Also concerned about the left buttock. He has not been  systemically unwell no fever chills. No nausea vomiting or diarrhea 04/25/18; the patient's left buttock wound is continued to deteriorate he is using Hydrofera Blue. He is still completing clindamycin for the cellulitis on the right leg although all of this looks better. 05/02/18 ooLeft buttock wound still with [name] lot of drainage and [name] very tightly adherent fibrinous necrotic surface. He has [name] deeper area superiorly ooThe left lateral calf wound is still closed ooDTI wound on the left heel necrotic surface especially the circumference using Iodoflex ooAreas between his left first second toe and right first second toe both look better. Dorsally and the right first second toe he had [name] necrotic surface although at smaller. In using silver alginate and ketoconazole. I did [name] culture last week which was [name] deep tissue culture of the reminiscence of the open wound on the right first second toe dorsally. This grew [name] few Acinetobacter and [name] few methicillin-resistant staph aureus. Nevertheless the area actually this week looked better. I didn't feel the need to specifically address this at least in terms of systemic antibiotics. 05/09/18; wounds are measuring larger more drainage per our intake. We are using Santyl covered with alginate on the large superficial buttock wounds, Iodosorb on the left heel, ketoconazole and silver alginate to the dorsal first and second toes bilaterally. 05/16/18; ooThe area on his left buttock better in some aspects  although the area superiorly over the ischial tuberosity required an extensive debridement.using Santyl ooLeft heel appears stable. Using Iodoflex ooThe areas between his first and second toes are not bad however there is spreading erythema up the dorsal aspect of his left foot this looks like cellulitis again. He is insensate the erythema is really very brilliant.o Erysipelas He went to see an allergist days ago because he was itching part of this he had lab  work done. This showed [name] white count of 15.1 with 70% neutrophils. Hemoglobin of 11.4 and [name] platelet count of 659,000. Last white count we had in [facility] was [name] 2-1/2 years ago which was 25.9 but he was ill at the time. He was able to show me some lab work that was done by his primary physician the pattern is about the same. 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 But prompted me to go ahead and do x-rays of both feet and the pelvis rule out osteomyelitis. He also had [name] comprehensive metabolic panel this was reasonably normal his albumin was 3.7 liver function tests BUN/creatinine all normal 05/23/18; x-rays of both his feet from last week were negative for underlying pulmonary abnormality. The x-ray of his pelvis however showed mild irregularity in the left ischial which may represent some early osteomyelitis. The wound in the left ischial continues to get deeper clearly now exposed muscle. Each week necrotic surface material over this area. Whereas the rest of the wounds do not look so bad. ooThe left ischial wound we have been using Santyl and calcium alginate ooT the left heel surface necrotic debris using Iodoflex o ooThe left lateral leg is still healed ooAreas on the left dorsal foot and the right dorsal foot are about the same. There is some inflammation on the left which might represent contact dermatitis, fungal dermatitis I am doubtful cellulitis although this looks better than last week 05/30/18; CT scan done at Hospital did not show any osteomyelitis or abscess. Suggested the possibility of underlying cellulitis although I don't see [name] lot of evidence of this at the bedside ooThe wound itself on the left buttock/upper thigh actually looks somewhat better. No debridement ooLeft heel also looks better no debridement continue Iodoflex ooBoth dorsal first second toe spaces appear better using Lotrisone. Left still required  debridement 06/06/18; ooIntake reported some purulent looking drainage from the left gluteal wound. Using Santyl and calcium alginate ooLeft heel looks better although still [name] nonviable surface requiring debridement ooThe left dorsal foot first/second webspace actually expanding and somewhat deeper. I may consider doing [name] shave biopsy of this area ooRight dorsal foot first/second webspace appears stable to improved. Using Lotrisone and silver alginate to both these areas 06/13/18 ooLeft gluteal surface looks better. Now separated in the 2 wounds. No debridement required. Still drainage. We'll continue silver alginate ooLeft heel continues to look better with Iodoflex continue this for at least another week ooOf his dorsal foot wounds the area on the left still has some depth although it looks better than last week. We've been using Lotrisone and silver alginate 06/20/18 ooLeft gluteal continues to look better healthy tissue ooLeft heel continues to look better healthy granulation wound is smaller. He is using Iodoflex and his long as this continues continue the Iodoflex ooDorsal right foot looks better unfortunately dorsal left foot does not. There is swelling and erythema of his forefoot. He had minor trauma to this several days ago but doesn't think this was  enough to have caused any tissue injury. Foot looks like cellulitis, we have had this problem before 06/27/18 on evaluation today patient appears to be doing [name] little worse in regard to his foot ulcer. 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 Both of which would really be his only options for treating this infection. In the past he has been given and effusion of Orbactiv. This is done very well for him in the past again it's one time dosing IV antibiotic therapy. Subsequently I do believe this is  something we're gonna need to see about doing at this point in time. Currently his other wounds seem to be doing somewhat better in my pinion I'[name] pretty happy in that regard. 07/03/18 on evaluation today patient's wounds actually appear to be doing fairly well. He has been tolerating the dressing changes without complication. All in all he seems to be showing signs of improvement. In regard to the antibiotics he has been dealing with infectious disease since I saw him last week as far as getting this scheduled. In the end [facility]'s [facility] this coming Friday. In the meantime he has been continuing to perform the dressing changes in such as previous. There does not appear to be any evidence of infection worsengin at this time. 07/10/18; ooSince I last saw this man 2 [name] ago things have actually improved. IV antibiotics of resulted in less forefoot erythema although there is still some present. He is not systemically unwell ooLeft buttock wounds o2 now have no depth there is increased epithelialization Using silver alginate ooLeft heel still requires debridement using Iodoflex ooLeft dorsal foot still with [name] sizable wound about the size of [name] border but healthy granulation ooRight dorsal foot still with [name] slitlike area using silver alginate 07/18/18; the patient's cellulitis in the left foot is improved in fact I think it is on its way to resolving. ooLeft buttock wounds o2 both look better although the larger one has hypertension granulation we've been using silver alginate ooLeft heel has some thick circumferential redundant skin over the wound edge which will need to be removed today we've been using Iodoflex ooLeft dorsal foot is still [name] sizable wound required debridement using silver alginate ooThe right dorsal foot is just about closed only [name] small open area remains here 07/25/18; left foot cellulitis is resolved ooLeft buttock  wounds o2 both look better. Hyper-granulation on the major area ooLeft heel as some debris over the surface but otherwise looks [name] healthier wound. Using silver collagen ooRight dorsal foot is just about closed 07/31/18; arrives with our intake nurse worried about purulent drainage from the buttock. We had hyper-granulation here last week ooHis buttock wounds o2 continue to look better ooLeft heel some debris over the surface but measuring smaller. ooRight dorsal foot unfortunately has openings between the toes ooLeft foot superficial wound looks less aggravated. 08/07/18 ooButtock wounds continue to look better although some of her granulation and the larger medial wound. silver alginate ooLeft heel continues to look [name] lot better.silver collagen ooLeft foot superficial wound looks less stable. Requires debridement. He has [name] new wound superficial area on the foot on the lateral dorsal foot. ooRight foot looks better using silver alginate without Lotrisone 08/14/2018; patient was in the ER last week diagnosed with [name] UTI. He is  now on Cefpodoxime and Macrodantin. ooButtock wounds continued to be smaller. Using silver alginate ooLeft heel continues to look better using silver collagen ooLeft foot superficial wound looks as though it is improving ooRight dorsal foot area is just about healed. 08/21/2018; patient is completed his antibiotics for his UTI. ooHe has 2 open areas on the buttocks. There is still not closed although the surface looks satisfactory. Using silver alginate ooLeft heel continues to improve using silver collagen ooThe bilateral dorsal foot areas which are at the base of his first and second toes/possible tinea pedis are actually stable on the left but worse on the right. The area on the left required debridement of necrotic surface. After debridement I obtained [name] specimen for PCR culture. ooThe right dorsal foot which is been just about healed last week is now  reopened 08/28/2018; culture done on the left dorsal foot showed coag negative staph both staph epidermidis and Lugdunensis. I think this is worthwhile initiating systemic treatment. I will use doxycycline given his long list of allergies. The area on the left heel slightly improved but still requiring debridement. ooThe large wound on the buttock is just about closed whereas the smaller one is larger. Using silver alginate in this area 09/04/2018; patient is completing his doxycycline for the left foot although this continues to be [name] very difficult wound area with very adherent necrotic debris. We are using silver alginate to all his wounds right foot left foot and the small wounds on his buttock, silver collagen on the left heel. 09/11/2018; once again this patient has intense erythema and swelling of the left forefoot. Lesser degrees of erythema in the right foot. He has [name] long list of allergies and intolerances. I will reinstitute doxycycline. 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 Using silver alginate ooLeft heel also looks better using silver collagen ooUnfortunately both the areas on his feet look worse. The area on the left first second webspace is now gone through to the plantar part of his foot. The area on the left foot anteriorly is irritated with erythema and swelling in the forefoot. 09/25/2018 ooHis wound on the left plantar heel looks better. Using silver collagen ooThe area on the left buttock 2 small remnant areas. One is closed one is still open. Using silver alginate ooThe areas between both his first and second toes look worse. This in spite of long-standing antifungal therapy with ketoconazole and silver alginate which should have antifungal activity ooHe has small areas around his original wound on the left calf one is on the bottom of the original scar tissue and one superiorly both of these are small  and superficial but again given wound history in this site this is worrisome 10/02/2018 ooLeft plantar heel continues to gradually contract using silver collagen ooLeft buttock wound is unchanged using silver alginate ooThe areas on his dorsal feet between his first and second toes bilaterally look about the same. 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 Small open area here. We have been using silver alginate 10/09/2018; ooLeft plantar heel continues to look healthy and contract. No debridement is required ooLeft buttock slightly smaller with [name] tape injury wound just below which was new this week ooDorsal feet somewhat improved I have been using clindamycin ooLeft lateral looks lower  extremity the actual open area looks worse although [name] lot of this is epithelialized. I am going to change to silver collagen today He has [name] lot more swelling in the right leg although this is not pitting not red and not particularly warm there is [name] lot of spasm in the right leg usually indicative of people with paralysis of some underlying discomfort. We have reviewed his vascular status from 2017 he had [name] left greater saphenous vein ablation. I wonder about referring him back to vascular surgery if the area on the left leg continues to deteriorate. 10/16/2018 in today for follow-up and management of multiple lower extremity ulcers. His left Buttock wound is much lower smaller and almost closed completely. The wound to the left ankle has began to reopen with Epithelialization and some adherent slough. He has multiple new areas to the left foot and leg. The left dorsal foot without much improvement. Wound present between left great webspace and 2nd toe. Erythema and edema present right leg. Right LE ultrasound obtained on 10/10/18  was negative for DVT . 10/23/2018; ooLeft buttock is closed over. Still dry macerated skin but there is no open wound. I suspect this is chronic pressure/moisture ooLeft lateral calf is quite [name] bit worse than when I saw this last. There is clearly drainage here he has macerated skin into the left plantar heel. We will change the primary dressing to alginate ooLeft dorsal foot has some improvement in overall wound area. Still using clindamycin and silver alginate ooRight dorsal foot about the same as the left using clindamycin and silver alginate ooThe erythema in the right leg has resolved. He is DVT rule out was negative ooLeft heel pressure area required debridement although the wound is smaller and the surface is health 10/26/2018 ooThe patient came back in for his nurse check today predominantly because of the drainage coming out of the left lateral leg with [name] recent reopening of his original wound on the left lateral calf. He comes in today with [name] large amount of surrounding erythema around the wound extending from the calf into the ankle and even in the area on the dorsal foot. He is not systemically unwell. He is not febrile. Nevertheless this looks like cellulitis. We have been using silver alginate to the area. I changed him to [name] regular visit and I am going to prescribe him doxycycline. The rationale here is [name] long list of medication intolerances and [name] history of MRSA. I did not see anything that I thought would provide [name] valuable culture 10/30/2018 ooFollow-up from his appointment 4 days ago with really an extensive area of cellulitis in the left calf left lateral ankle and left dorsal foot. I put him on doxycycline. He has [name] long list of medication allergies which are true allergy reactions. 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 In any case he is [name] lot better today. The erythema especially in the anterior and  lateral left calf is better. He still has left ankle erythema. He also is complaining about increasing edema in the right leg we have only been using Kerlix Coban and he has been doing the wraps at home. Finally he has [name] spotty rash on the medial part of his upper left calf which looks like folliculitis or perhaps wrap occlusion type injury. Small superficial macules not pustules 11/06/18 patient arrives today with again [name] considerable degree of erythema around the wound on the left lateral  calf extending into the dorsal ankle and dorsal foot. This is [name] lot worse than when I saw this last week. He is on doxycycline really with not [name] lot of improvement. He has not been systemically unwell Wounds on the; left heel actually looks improved. Original area on the left foot and proximity to the first and second toes looks about the same. He has superficial areas on the dorsal foot, anterior calf and then the reopening of his original wound on the left lateral calf which looks about the same ooThe only area he has on the right is the dorsal webspace first and second which is smaller. ooHe has [name] large area of dry erythematous skin on the left buttock small open area here. 11/13/2018; the patient arrives in much better condition. The erythema around the wound on the left lateral calf is [name] lot better. Not sure whether this was the clindamycin or the TCA and ketoconazole or just in the improvement in edema control [stasis dermatitis]. In any case this is [name] lot better. The area on the left heel is very small and just about resolved using silver collagen we have been using silver alginate to the areas on his dorsal feet 11/20/2018; his wounds include the left lateral calf, left heel, dorsal aspects of both feet just proximal to the first second webspace. He is stable to slightly improved. I did not think any changes to his dressings were going to be necessary 11/27/2018 he has [name] reopening on the left buttock which  is surrounded by what looks like tinea or perhaps some other form of dermatitis. The area on the left dorsal foot has some erythema around it I have marked this area but I am not sure whether this is cellulitis or not. Left heel is not closed. Left calf the reopening is really slightly longer and probably worse 1/13; in general things look better and smaller except for the left dorsal foot. Area on the left heel is just about closed, left buttock looks better only [name] small wound remains in the skin looks better [using Lotrisone] 1/20; the area on the left heel only has [name] few remaining open areas here. Left lateral calf about the same in terms of size, left dorsal foot slightly larger right lateral foot still not closed. The area on the left buttock has no open wound and the surrounding skin looks [name] lot better 1/27; the area on the left heel is closed. Left lateral calf better but still requiring extensive debridements. The area on his left buttock is closed. He still has the open areas on the left dorsal foot which is slightly smaller in the right foot which is slightly expanded. We have been using Iodoflex on these areas as well 2/3; left heel is closed. Left lateral calf still requiring debridement using Iodoflex there is no open area on his left buttock however he has dry scaly skin over [name] large area of this. Not really responding well to the Lotrisone. Finally the areas on his dorsal feet at the level of the first second webspace are slightly smaller on the right and about the same on the left. Both of these vigorously debrided with Anasept and gauze 2/10; left heel remains closed he has dry erythematous skin over the left buttock but there is no open wound here. Left lateral leg has come in and with. Still requiring debridement we have been using Iodoflex here. Finally the area on the left dorsal foot and right dorsal foot are  really about the same extremely dry callused fissured areas. He does not  yet have [name] dermatology appointment 2/17; left heel remains closed. He has [name] new open area on the left buttock. The area on the left lateral calf is bigger longer and still covered in necrotic debris. No major change in his foot areas bilaterally. I am awaiting for [name] dermatologist to look on this. We have been using ketoconazole I do not know that this is been doing any good at all. 2/24; left heel remains closed. The left buttock wound that was new reopening last week looks better. The left lateral calf appears better also although still requires debridement. The major area on his foot is the left first second also requiring debridement. We have been putting Prisma on all wounds. I do not believe that the ketoconazole has done too much good for his feet. He will use Lotrisone I am going to give him [name] 2-week course of terbinafine. We still do not have [name] dermatology appointment 3/2 left heel remains closed however there is skin over bone in this area I pointed this out to him today. The left buttock wound is epithelialized but still does not look completely stable. The area on the left leg required debridement were using silver collagen here. With regards to his feet we changed to Lotrisone last week and silver alginate. 3/9; left heel remains closed. Left buttock remains closed. The area on the right foot is essentially closed. The left foot remains unchanged. Slightly smaller on the left lateral calf. Using silver collagen to both of these areas 3/16-Left heel remains closed. Area on right foot is closed. Left lateral calf above the lateral malleolus open wound requiring debridement with easy bleeding. Left dorsal wound proximal to first toe also debrided. Left ischial area open new. Patient has been using Prisma with wrapping every 3 days. Dermatology appointment is apparently tomorrow.Patient has completed his terbinafine 2-week course with some apparent improvement according to him, there is  still flaking and dry skin in his foot on the left 3/23; area on the right foot is reopened. The area on the left anterior foot is about the same still [name] very necrotic adherent surface. He still has the area on the left leg and reopening is on the left buttock. He apparently saw dermatology although I do not have [name] note. According to the patient who is usually fairly well informed they did not have any good ideas. Put him on oral terbinafine which she is been on before. 3/30; using silver collagen to all wounds. Apparently his dermatologist put him on doxycycline and rifampin presumably some culture grew staph. I do not have this result. He remains on terbinafine although I have used terbinafine on him before 4/6; patient has had [name] fairly substantial reopening on the right foot between the first and second toes. He is finished his terbinafine and I believe is on doxycycline and rifampin still as prescribed by dermatology. We have been using silver collagen to all his wounds although the patient reports that he thinks silver alginate does better on the wounds on his buttock. 4/13; the area on his left lateral calf about the same size but it did not require debridement. ooLeft dorsal foot just proximal to the webspace between the first and second toes is about the same. Still nonviable surface. I note some superficial bronze discoloration of the dorsal part of his foot ooRight dorsal foot just proximal to the first and second toes also looks about  the same. I still think there may be the same discoloration I noted above on the left ooLeft buttock wound looks about the same 4/20; left lateral calf appears to be gradually contracting using silver collagen. 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 The left dorsal foot wound is debrided of tightly adherent necrotic debris and really cleans up quite nicely. The right area is worse with  expansion. I did not debride this it is now over the base of the second toe ooThe area on his left buttock is smaller no debridement is required using silver collagen 5/4; left calf continues to make good progress. ooHe arrives with erythema around the wounds on his dorsal foot which even extends to the plantar aspect. Very concerning for coexistent infection. He is finished the erythromycin I gave him for possible erythrasma this does not seem to have helped. ooThe area on the left foot is about the same base of the dorsal toes ooIs area on the buttock looks improved on the left 5/11; left calf and left buttock continued to make good progress. Left foot is about the same to slightly improved. ooMajor problem is on the right foot. He has not had an x-ray. Deep tissue culture I did last week showed both Enterobacter and E. coli. I did not change the doxycycline I put him on empirically although neither 1 of these were plated to doxycycline. He arrives today with the erythema looking worse on both the dorsal and plantar foot. Macerated skin on the bottom of the foot. he has not been systemically unwell 5/18-Patient returns at 1 week, left calf wound appears to be making some progress, left buttock wound appears slightly worse than last time, left foot wound looks slightly better, right foot redness is marginally better. X-ray of both feet show no air or evidence of osteomyelitis. Patient is finished his Omnicef and terbinafine. He continues to have macerated skin on the bottom of the left foot as well as right 5/26; left calf wound is better, left buttock wound appears to have multiple small superficial open areas with surrounding macerated skin. X-rays that I did last time showed no evidence of osteomyelitis in either foot. He is finished cefdinir and doxycycline. I do not think that he was on terbinafine. He continues to have [name] large superficial open area on the right foot anterior dorsal and  slightly between the first and second toes. I did send him to dermatology 2 months ago or so wondering about whether they would do [name] fungal scraping. I do not believe they did but did do [name] culture. We have been using silver alginate to the toe areas, he has been using antifungals at home topically either ketoconazole or Lotrisone. We are using silver collagen on the left foot, silver alginate on the right, silver collagen on the left lateral leg and silver alginate on the left buttock 6/1; left buttock area is healed. We have the left dorsal foot, left lateral leg and right dorsal foot. We are using silver alginate to the areas on both feet and silver collagen to the area on his left lateral calf 6/8; the left buttock apparently reopened late last week. He is not really sure how this happened. He is tolerating the terbinafine. Using silver alginate to all wounds 6/15; left buttock wound is larger than last week but still superficial. ooCame in the clinic today with [name] report of purulence from the left lateral leg I did not identify any  infection ooBoth areas on his dorsal feet appear to be better. He is tolerating the terbinafine. Using silver alginate to all wounds 6/22; left buttock is about the same this week, left calf quite [name] bit better. His left foot is about the same however he comes in with erythema and warmth in the right forefoot once again. Culture that I gave him in the beginning of May showed Enterobacter and E. coli. I gave him doxycycline and things seem to improve although neither 1 of these organisms was specifically plated. 6/29; left buttock is larger and dry this week. Left lateral calf looks to me to be improved. Left dorsal foot also somewhat improved right foot completely unchanged. The erythema on the right foot is still present. He is completing the Ceftin dinner that I gave him empirically [see discussion above.) 7/6 - All wounds look to be stable and perhaps improved, the  left buttock wound is slightly smaller, per patient bleeds easily, completed ceftin, the right foot redness is less, he is on terbinafine 7/13; left buttock wound about the same perhaps slightly narrower. Area on the left lateral leg continues to narrow. Left dorsal foot slightly smaller right foot about the same. We are using silver alginate on the right foot and Hydrofera Blue to the areas on the left. Unna boot on the left 2 layer compression on the right 7/20; left buttock wound absolutely the same. Area on lateral leg continues to get better. Left dorsal foot require debridement as did the right no major change in the 7/27; left buttock wound the same size necrotic debris over the surface. The area on the lateral leg is closed once again. His left foot looks better right foot about the same although there is some involvement now of the posterior first second toe area. He is still on terbinafine which I have given him for [name] month, not certain [name] centimeter major change 06/25/19-All wounds appear to be slightly improved according to report, left buttock wound looks clean, both foot wounds have minimal to no debris the right dorsal foot has minimal slough. We are using Hydrofera Blue to the left and silver alginate to the right foot and ischial wound. 8/10-Wounds all appear to be around the same, the right forefoot distal part has some redness which was not there before, however the wound looks clean and small. Ischial wound looks about the same with no changes 8/17; his wound on the left lateral calf which was his original chronic venous insufficiency wound remains closed. Since I last saw him the areas on the left dorsal foot right dorsal foot generally appear better but require debridement. The area on his left initial tuberosity appears somewhat larger to me perhaps hyper granulated and bleeds very easily. We have been using Hydrofera Blue to the left dorsal foot and silver alginate to everything  else 8/24; left lateral calf remains closed. The areas on his dorsal feet on the webspace of the first and second toes bilaterally both look better. The area on the left buttock which is the pressure ulcer stage II slightly smaller. I change the dressing to Hydrofera Blue to all areas 8/31; left lateral calf remains closed. The area on his dorsal feet bilaterally look better. Using Hydrofera Blue. Still requiring debridement on the left foot. No change in the left buttock pressure ulcers however 9/14; left lateral calf remains closed. Dorsal feet look quite [name] bit better than 2 [name] ago. Flaking dry skin also [name] lot better with the ammonium  lactate I gave him 2 [name] ago. The area on the left buttock is improved. He states that his Roho cushion developed [name] leak and he is getting [name] new one, in the interim he is offloading this vigorously 9/21; left calf remains closed. Left heel which was [name] possible DTI looks better this week. He had macerated tissue around the left dorsal foot right foot looks satisfactory and improved left buttock wound. I changed his dressings to his feet to silver alginate bilaterally. Continuing Hydrofera Blue on the left buttock. 9/28 left calf remains closed. Left heel did not develop anything [possible DTI] dry flaking skin on the left dorsal foot. Right foot looks satisfactory. Improved left buttock wound. We are using silver alginate on his feet Hydrofera Blue on the buttock. I have asked him to go back to the Lotrisone on his feet including the wounds and surrounding areas 10/5; left calf remains closed. The areas on the left and right feet about the same. [name] lot of this is epithelialized however debris over the remaining open areas. He is using Lotrisone and silver alginate. The area on the left buttock using Hydrofera Blue 10/26. Patient has been out for 3 [name] secondary to Covid concerns. He tested negative but I think his wife tested positive. He comes in today with the  left foot substantially worse, right foot about the same. Even more concerning he states that the area on his left buttock closed over but then reopened and is considerably deeper in one aspect than it was before [stage III wound] 11/2; left foot really about the same as last week. Quarter sized wound on the dorsal foot just proximal to the first second toes. Surrounding erythema with areas of denuded epithelium. This is not really much different looking. Did not look like cellulitis this time however. ooRight foot area about the same.. We have been using silver alginate alginate on his toes ooLeft buttock still substantial irritated skin around the wound which I think looks somewhat better. We have been using Hydrofera Blue here. 11/9; left foot larger than last week and [name] very necrotic surface. Right foot I think is about the same perhaps slightly smaller. Debris around the circumference also addressed. Unfortunately on the left buttock there is been [name] decline. Satellite lesions below the major wound distally and now [name] an additional one posteriorly we have been using Hydrofera Blue but I think this is [name] pressure issue 11/16; left foot ulcer dorsally again [name] very adherent necrotic surface. Right foot is about the same. Not much change in the pressure ulcer on his left buttock. 11/30; left foot ulcer dorsally basically the same as when I saw him 2 [name] ago. Very adherent fibrinous debris on the wound surface. Patient reports [name] lot of drainage as well. 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 We have been using Iodoflex, patient changed back to alginate because of the drainage. Area on his right dorsal foot really looks benign with [name] healthier surface certainly [name] lot better than on the left. Left buttock wounds all improved using Hydrofera Blue 12/7; left dorsal foot again no improvement. Tightly adherent debris. PCR culture I did last week only  showed likely skin contaminant. I have gone ahead and done [name] punch biopsy of this which is about the last thing in terms of investigations I can think to do. He has known venous insufficiency and venous hypertension and this could be the issue here. The area on  the right foot is about the same left buttock slightly worse according to our intake nurse secondary to [name] Blue sticking to the wound 12/14; biopsy of the left foot that I did last time showed changes that could be related to wound healing/chronic stasis dermatitis phenomenon no neoplasm. We have been using silver alginate to both feet. I change the one on the left today to Sorbact and silver alginate to his other 2 wounds 12/28; the patient arrives with the following problems; ooMajor issue is the dorsal left foot which continues to be [name] larger deeper wound area. Still with [name] completely nonviable surface ooParadoxically the area mirror image on the right on the right dorsal foot appears to be getting better. ooHe had some loss of dry denuded skin from the lower part of his original wound on the left lateral calf. Some of this area looked [name] little vulnerable and for this reason we put him in wrap that on this side this week ooThe area on his left buttock is larger. He still has the erythematous circular area which I think is [name] combination of pressure, sweat. This does not look like cellulitis or fungal dermatitis 11/26/2019; -Dorsal left foot large open wound with depth. Still debris over the surface. Using Sorbact ooThe area on the dorsal right foot paradoxically has closed over [facility] has [name] reopening on the left ankle laterally at the base of his original wound that extended up into the calf. This appears clean. ooThe left buttock wound is smaller but with very adherent necrotic debris over the surface. We have been using silver alginate here as well The patient had arterial studies done in 2017. He had biphasic waveforms at the  dorsalis pedis and posterior tibial bilaterally. ABI in the left was 1.17. Digit waveforms were dampened. He has slight spasticity in the great toes I do not think [name] TBI would be possible 1/11; the patient comes in today with [name] sizable reopening between the first and second toes on the right. This is not exactly in the same location where we have been treating wounds previously. According to our intake nurse this was actually fairly deep but 0.6 cm. The area on the left dorsal foot looks about the same the surface is somewhat cleaner using Sorbact, his MRI is in 2 days. We have not managed yet to get arterial studies. The new reopening on the left lateral calf looks somewhat better using alginate. The left buttock wound is about the same using alginate 1/18; the patient had his ARTERIAL studies which were quite normal. ABI in the right at 1.13 with triphasic/biphasic waveforms on the left ABI 1.06 again with triphasic/biphasic waveforms. It would not have been possible to have done [name] toe brachial index because of spasticity. We have been using Sorbac to the left foot alginate to the rest of his wounds on the right foot left lateral calf and left buttock 1/25; arrives in clinic with erythema and swelling of the left forefoot worse over the first MTP area. This extends laterally dorsally and but also posteriorly. Still has an area on the left lateral part of the lower part of his calf wound it is eschared and clearly not closed. ooArea on the left buttock still with surrounding irritation and erythema. ooRight foot surface wound dorsally. The area between the right and first and second toes appears better. 2/1; ooThe left foot wound is about the same. Erythema slightly better I gave him [name] week of doxycycline empirically ooRight foot wound  is more extensive extending between the toes to the plantar surface ooLeft lateral calf really no open surface on the inferior part of his original wound however  the entire area still looks vulnerable ooAbsolutely no improvement in the left buttock wound required debridement. 2/8; the left foot is about the same. Erythema is slightly improved I gave him clindamycin last week. ooRight foot looks better he is using Lotrimin and silver alginate ooHe has [name] breakdown in the left lateral calf. Denuded epithelium which I have removed ooLeft buttock about the same were using Hydrofera Blue 2/15; left foot is about the same there is less surrounding erythema. Surface still has tightly adherent debris which I have debriding however not making any progress ooRight foot has [name] substantial wound on the medial right second toe between the first and second webspace. ooStill an open area on the left lateral calf distal area. ooButtock wound is about the same 2/22; left foot is about the same less surrounding erythema. Surface has adherent debris. Polymen Ag Right foot area significant wound between the first and second toes. We have been using silver alginate here Left lateral leg polymen Ag at the base of his original venous insufficiency wound ooLeft buttock some improvement here 3/1; ooRight foot is deteriorating in the first second toe webspace. Larger and more substantial. We have been using silver alginate. ooLeft dorsal foot about the same markedly adherent surface debris using PolyMem Ag ooLeft lateral calf surface debris using PolyMem AG ooLeft buttock is improved again using PolyMem Ag. ooHe is completing his terbinafine. The erythema in the foot seems better. He has been on this for 2 [name] 3/8; no improvement in any wound area in fact he has [name] small open area on the dorsal midfoot which is new this week. He has not gotten his foot x-rays yet 3/15; his x-rays were both negative for osteomyelitis of both feet. No major change in any of his wounds on the extremities however his buttock wounds are better. We have been using polymen on the buttocks,  left lower leg. Iodoflex on the left foot and silver alginate on the right 3/22; arrives in clinic today with the 2 major issues are the improvement in the left dorsal foot wound which for once actually looks healthy with [name] nice healthy wound surface without debridement. Using Iodoflex here. Unfortunately on the left lateral calf which is in the distal part of his original wound he came to the clinic here for there was purulent drainage noted some increased breakdown scattered around the original area and [name] small area proximally. We we are using polymen here will change to silver alginate today. His buttock wound on the left is better and I think the area on the right first second toe webspace is also improved 3/29; left dorsal foot looks better. Using Iodoflex. Left ankle culture from deterioration last time grew E. coli, Enterobacter and Enterococcus. I will give him [name] course of cefdinir although that will not cover Enterococcus. The area on the right foot in the webspace of the first and second toe lateral first toe looks better. The area on his buttock is about healed Vascular appointment is on April 21. This is to look at his venous system vis--vis continued breakdown of the wounds on the left including the left lateral leg and left dorsal foot he. He has had previous ablations on this side 4/5; the area between the right first and second toes lateral aspect of the first toe looks better. Dorsal  aspect of the left first toe on the left foot also improved. Unfortunately the left lateral lower leg is larger and there is [name] second satellite wound superiorly. The usual superficial abrasions on the left buttock overall better but certainly not closed 4/12; the area between the right first and second toes is improved. Dorsal aspect of the left foot also slightly smaller with [name] vibrant healthy looking surface. No real change in the left lateral leg and the left buttock wound is healed He has an  unaffordable co-pay for Apl[employer]. Appointment with vein and vascular with regards to the left leg venous part of the circulation is on 4/21 4/19; we continue to see improvement in all wound areas. Although this is minor. He has his vascular appointment on 4/21. The area on the left buttock has not reopened although right in the center of this area the skin looks somewhat threatened 4/26; the left buttock is unfortunately reopened. In general his left dorsal foot has [name] healthy surface and looks somewhat smaller although it was not measured as such. The area between his first and second toe webspace on the right as [name] small wound against the first toe. The patient saw vascular surgery. The real question I was asking was about the small saphenous vein on the left. He has previously ablated left greater saphenous vein. Nothing further was commented on on the left. Right greater saphenous vein without reflux at the saphenofemoral junction or proximal thigh there was no indication for ablation of the right greater saphenous vein duplex was negative for DVT bilaterally. They did not think there was anything from [name] point of view that could be offered. They ABIs within normal limits 5/3; only small open area on the left buttock. The area on the left lateral leg which was his original venous reflux is now 2 wounds both which look clean. We are using Iodoflex on the left dorsal foot which looks healthy and smaller. He is down to [name] very tiny area between the right first and second toes, using silver alginate 5/10; all of his wounds appear better. We have much better edema control in 4 layer compression on the left. This may be the factor that is allowing the left foot and left lateral calf to heal. He has external compression garments at home 04/14/20-All of his wounds are progressing well, the left forefoot is practically closed, left ischium appears to be about the same, right toe webspace is  also smaller. The left lateral leg is about the same, continue using Hydrofera Blue to this, silver alginate to the ischium, Iodoflex to the toe space on the right 6/7; most of his wounds outside of the left buttock are doing well. The area on the left lateral calf and left dorsal foot are smaller. The area on the right foot in between the first and second toe webspace is barely visible although he still says there is some drainage here is the only reason I did not heal this out. ooUnfortunately the area on the left buttock almost looks like he has [name] skin tear from tape. He has open wound and then [name] large flap of skin that we are trying to get adherence over an area just next to the remaining wound 6/21; 2 week follow-up. I believe is been here for nurse visits. Miraculously the area between his first and second toes on the left dorsal foot is closed over. Still open on the right first second web space. The left lateral  calf has 2 open areas. Distally this is more superficial. The proximal area had [name] little more depth and required debridement of adherent necrotic material. His buttock wound is actually larger we have been using silver alginate here 6/28; the patient's area on the left foot remains closed. Still open wet area between the first and second toes on the right and also extending into the plantar aspect. We have been using silver alginate in this location. He has 2 areas on the left lower leg part of his original long wounds which I think are better. We have been using Hydrofera Blue here. Hydrofera Blue to the left buttock which is stable 7/12; left foot remains closed. Left ankle is closed. May be [name] small area between his right first and second toes the only truly open area is on the left buttock. We have been using Hydrofera Blue here 7/19; patient arrives with marked deterioration especially in the left foot and ankle. We did not put him in [name] compression wrap on the left last week in  fact he wore his juxta lite stockings on either side although he does not have an underlying stocking. He has [name] reopening on the left dorsal foot, left lateral ankle and [name] new area on the right dorsal ankle. More worrisome is the degree of erythema on the left foot extending on the lateral foot into the lateral lower leg on the left 7/26; the patient had erythema and drainage from the lateral left ankle last week. Culture of this grew MRSA resistant to doxycycline and clindamycin which are the 2 antibiotics we usually use with this patient who has multiple antibiotic allergies including linezolid, trimethoprim sulfamethoxazole. I had give him an empiric doxycycline and he comes in the area certainly looks somewhat better although it is blotchy in his lower leg. He has not been systemically unwell. He has had areas on the left dorsal foot which is [name] reopening, chronic wounds on the left lateral ankle. Both of these I think are secondary to chronic venous insufficiency. The area between his first and second toes is closed as far as I can tell. He had [name] new wrap injury on the right dorsal ankle last week. Finally he has an area on the left buttock. We have been using silver alginate to everything except the left buttock we are using Hydrofera Blue 06/30/20-Patient returns at 1 week, has been given [name] sample dose pack of NUZYRA which is [name] tetracycline derivative [omadacycline], patient has completed those, we have been using silver alginate to almost all the wounds except the left ischium where we are using Hydrofera Blue all of them look better 8/16; since I last saw the patient he has been doing well. The area on the left buttock, left lateral ankle and left foot are all closed today. He has completed the [place] I gave him last time and tolerated this well. He still has open areas on the right dorsal ankle and in the right first second toe area which we are using silver alginate. 8/23; we put him in his  bilateral external compression stockings last week as he did not have anything open on either leg except for concerning area between the right first and second toe. He comes in today with an area on the left dorsal foot slightly more proximal than the original wound, the left lateral foot but this is actually [name] continuation of the area he had on the left lateral ankle from last time. As well he is  opened up on the left buttock again. 8/30; comes in today with things looking [name] lot better. The area on the left lower ankle has closed down as has the left foot but with eschar in both areas. The area on the dorsal right ankle is also epithelialized. Very little remaining of the left buttock wound. We have been using silver alginate on all wound areas 9/13; the area in the first second toe webspace on the right has fully epithelialized. He still has some vulnerable epithelium on the right and the ankle and the dorsal foot. He notes weeping. He is using his juxta lite stocking. On the left again the left dorsal foot is closed left lateral ankle is closed. We went to the juxta lite stocking here as well. ooStill vulnerable in the left buttock although only 2 small open areas remain here 9/27; 2-week follow-up. We did not look at his left leg but the patient says everything is closed. He is [name] bit disturbed by the amount of edema in his left foot he is using juxta lite stockings but asking about over the toes stockings which would be 30/40, will talk to him next time. According to him there is no open wound on either the left foot or the left ankle/calf He has an open area on the dorsal right calf which I initially point [name] wrap injury. He has superficial remaining wound on the left ischial tuberosity been using silver alginate although he says this sticks to the wound 10/5; we gave him 2-week follow-up but he called yesterday expressing some concerns about his right foot right ankle and the left buttock. He  came in early. There is still no open areas on the left leg and that still in his juxta lite stocking 10/11; he only has 1 small area on the left buttock that remains measuring millimeters 1 mm. Still has the same irritated skin in this area. We recommended zinc oxide when this eventually closes and pressure relief is meticulously is he can do this. He still has an area on the dorsal part of his right first through third toes which is [name] bit irritated and still open and on the dorsal ankle near the crease of the ankle. We have been using silver alginate and using his own stocking. He has nothing open on the left leg or foot 10/25; 2-week follow-up. Not nearly as good on the left buttock as I was hoping. For open areas with 5 looking threatened small. He has the erythematous irritated chronic skin in this area. oo1 area on the right dorsal ankle. He reports this area bleeds easily ooRight dorsal foot just proximal to the base of his toes ooWe have been using silver alginate. 11/8; 2-week follow-up. Left buttock is about the same although I do not think the wounds are in the same location we have been using silver alginate. I have asked him to use zinc oxide on the skin around the wounds. ooHe still has [name] small area on the right dorsal ankle he reports this bleeds easily ooRight dorsal foot just proximal to the base of the toes does not have anything open although the skin is very dry and scaly ooHe has [name] new opening on the nailbed of the left great toe. Nothing on the left ankle 11/29; 3-week follow-up. Left buttock has 2 open areas. And washing of these wounds today started bleeding easily. Suggesting very friable tissue. We have been using silver alginate. Right dorsal ankle which I thought was  initially [name] wrap injury we have been using silver alginate. Nothing open between the toes that I can see. He states the area on the left dorsal toe nailbed healed after the last visit in 2 or 3  days 12/13; 3-week follow-up. His left buttock now has 3 open areas but the original 2 areas are smaller using polymen here. Surrounding skin looks better. The right dorsal ankle is closed. He has [name] small opening on the right dorsal foot at the level of the third toe. In general the skin looks better here. He is wearing his juxta lite stocking on the left leg says there is nothing open 11/24/2020; 3 [name] follow-up. His left buttock still has the 3 open areas. We have been using polymen but due to lack of response he changed to [facility] [place] area. Surrounding skin is dry erythematous and irritated looking. There is no evidence of infection either bacterial or fungal however there is loss of surface epithelium ooHe still has very dry skin in his foot causing irritation and erythema on the dorsal part of his toes. This is not responded to prolonged courses of antifungal simply looks dry and irritated 1/24; left buttock area still looks about the same he was unable to find the triad ointment that we had suggested. The area on the right lower leg just above the dorsal ankle has reopened and the areas on the right foot between the first second and second third toes and scaling on the bottom of the foot has been about the same for quite some time now. been using silver alginate to all wound areas 2/7; left buttock wound looked quite good although not much smaller in terms of surface area surrounding skin looks better. Only [name] few dry flaking areas on the right foot in between the first and second toes the skin generally looks better here [ammonium lactate]. Finally the area on the right dorsal ankle is closed 2/21; ooThere is no open area on the right foot even between the right first and second toe. Skin around this area dorsally and plantar aspects look better. ooHe has [name] reopening of the area on the right ankle just above the crease of the ankle dorsally. I continue to think that this is probably  friction from spasms may be even this time with his stocking under the compression stockings. ooWounds on his left buttock look about the same there [name] couple of areas that have reopened. He has [name] total square area of loss of epithelialization. This does not look like infection it looks like [name] contact dermatitis but I just cannot determine to what 3/14; there is nothing on the right foot between the first and second toes this was carefully inspected under illumination. Some chronic irritation on the dorsal part of his foot from toes 1-3 at the base. Nothing really open here substantially. Still has an area on the right foot/ankle that is actually larger and hyper granulated. His buttock area on the left is just about closed however he has chronic inflammation with loss of the surface epithelial layer 3/28; 2-week follow-up. In clinic today with [name] new wound on the left anterior mid tibia. Says this happened about 2 [name] ago. He is not really sure how wonders about the spasticity of his legs at night whether that could have caused this other than that he does not have [name] good idea. He has been using topical antibiotics and silver alginate. The area on his right dorsal ankle seems somewhat better. ooFinally  everything on his left buttock is closed. 4/11; 2-week follow-up. All of his wounds are better except for the area over the ischium and left buttock which have opened up widely again. At least part of this is covered in necrotic fibrinous material another part had rolled nonviable skin. The area on the right ankle, left anterior mid tibia are both [name] lot better. He had no open wounds on either foot including the areas between the first and second toes 4/25; patient presents for 2-week follow-up. He states that the wounds are overall stable. He has no complaints today and states he is using Hydrofera Blue to open wounds. 5/9; have not seen this man in over [name] month. For my memory he has open areas on  the left mid tibia and right ankle. T oday he has new open area on the right dorsal foot which we have not had [name] problem with recently. He has the sustained area on the left buttock He is also changed his insurance at the beginning of the year [employer]. We will need prior authorizations for debridement 5/23; patient presents for 2-week follow-up. He has prior authorizations for debridement. He denies any issues in the past 2 [name] with his wound care. He has been using Hydrofera Blue to all the wounds. He does report [name] circular rash to the upper left leg that is new. He denies acute signs of infection. 6/6; 2-week follow-up. The patient has open wounds on the left buttock which are worse than the last time I saw this about [name] month ago. He also has [name] new area to me on the left anterior mid tibia with some surrounding erythema. The area on the dorsal ankle on the right is closed but I think this will be [name] friction injury every time this area is exposed to either our wraps or his compression stockings caused by unrelenting spasms in this leg. 6/20; 2-week follow-up. oo The patient has open wounds on the left buttock which is about the same. Using [facility] here. - The left mid tibia has [name] static amount of surrounding erythema. Also [name] raised area in the center. We have been using Hydrofera Blue here. ooooFinally he has broken down in his dorsal right foot extending between the first and second toes and going to the base of the first and second toe webspace. I have previously assumed that this was severe venous hypertension 7/5; 2-week follow-up oo The left buttock wound actually looks better. We are using Hydrofera Blue. He has extensive skin irritation around this area and I have not really been able to get that any better. I have tried Lotrisone i.e. antifungals and steroids. More most recently we have just been using Coloplast really looks about the same. oo The left mid tibia which was  new last week culture to have very resistant staph aureus. Not only methicillin-resistant but doxycycline resistant. The patient has [name] plethora of antibiotic allergies including sulfa, linezolid. I used topical bacitracin on this but he has not started this yet. oo In addition he has an expanding area of erythema with [name] wound on the dorsal right foot. I did [name] deep tissue culture of this area today 7/12; oo Left buttock area actually looks better surrounding skin also looks less irritated. oo Left mid tibia looks about the same. He is using bacitracin this is not worse oo Right dorsal foot looks about the same as well. oo The left first toe also looks about the same 7/19; left  buttock wound continues to improve in terms of open areas oo Left mid tibia is still concerning amount of swelling he is using bacitracin oo Dorsal left first toe somewhat smaller oo Right dorsal foot somewhat smaller 7/25; left buttock wound actually continues to improve oo Left mid tibia area has less swelling. I gave him all my samples of new Nuzyra. This seems to have helped although the wound is still open it. His abrasion closed by here oo Left dorsal great toe really no better. Still [name] very nonviable surface oo Right dorsal foot perhaps some better. oo We have been using bacitracin and silver nitrate to the areas on his lower legs and Hydrofera Blue to the area on the buttock. 8/16 oo Disappointed that his left buttock wound is actually more substantial. Apparently during the last nurse visit these were both very small. He has continued irritation to [name] large area of skin on his buttock. I have never been able to totally explain this although I think it some combination of the way he sits, pressure, moisture. He is not incontinent enough to contribute to this. oo Left dorsal great toe still fibrinous debris on the surface that I have debrided today oo Large area across the dorsal right toes. oo The  area on the left anterior mid tibia has less swelling. He completed the [place]. This does not look infected although the tissue is still fried 8/30; 2-week follow-up. oo Left buttock areas not improved. We used Hydrofera Blue on this. Weeping wet with the surrounding erythema that I have not been able to control even with Lotrisone and topical Coloplast oo Left dorsal great toe looks about the same oo More substantial area again at the base of his toes on the left which is new this week. oo Area across the dorsal right toes looks improved oo The left anterior mid tibia looks like it is trying to close 9/13; 2-week follow-up. Using silver alginate on all of his wounds. The left dorsal foot does not look any better. He has the area on the dorsal toe and also the areas at the base of all of his toes 1 through 3. On the right foot he has [name] similar pattern in [name] similar area. He has the area on his left mid tibia that looks fairly healthy. Finally the area on his left buttock looks somewhat bette 9/20; culture I did of the left foot which was [name] deep tissue culture last time showed E. coli he has erythema around this wound. Still [name] completely necrotic surface. His right dorsal foot looks about the same. He has [name] very friable surface to the left anterior mid tibia. Both buttock wounds look better. We have been using silver alginate to all wounds 10/4; he has completed the cephalexin that I gave him last time for the left foot. He is using topical gentamicin under silver alginate silver alginate being applied to all the wounds. Unfortunately all the wounds look irritated on his dorsal right foot dorsal left foot left mid tibia. I wonder if this could be [name] silver allergy. I am going to change him to [facility] on the lower extremity. The skin on the left buttock and left posterior thigh still flaking dry and irritated. This has continued no matter what I have applied topically to this. He has  [name] solitary open wound which by itself does not look too bad however the entire area of surrounding skin does not change no matter what we have applied  here 10/18; the area on the left dorsal foot and right dorsal foot both look better. The area on the right extends into the plantar but not between his toes. We have been using silver alginate. He still has [name] rectangular erythematous area around the area on the left tibia. The wound itself is very small. Finally everything on his left buttock looks [name] little larger the skin is erythematous 11/15; patient comes in with the left dorsal and right dorsal foot distally looking somewhat better. Still nonviable surface on the left foot which required debridement. He still has the area on the left anterior mid tibia although this looks somewhat better. He has [name] new area on the right lateral lower leg just above the ankle. Finally his left buttock looks terrible with multiple superficial open wounds geometric square shaped area of chronic erythema which I have not been able to sort through 11/29; right dorsal foot and left dorsal foot both look somewhat better. No debridement required. He has the fragile area on the left anterior mid tibia this looks and continues to look somewhat better. Right lateral lower leg just above the ankle we identified last time also looks better. In general the area on his left buttock looks improved. We are using Hydrofera Blue to all wound areas 12/13; right dorsal foot looks better. The area on the right lateral leg is healed. Left dorsal foot has 2 open areas both of which require debridement. The fragile area on the left anterior mid tibial looks better. Smaller area on his buttocks. Were using Hydrofera Blue 1/10; patient comes in with everything looking slightly larger and/or worse. This includes his left buttock, reopening of the left mid tibia, larger areas on the left dorsal foot and what looks to be [name] cellulitis on the  right dorsal and plantar foot. We have been using Hydrofera Blue on all wounds. 1/17; right dorsal foot distally looks better today. The left foot has 2 open wounds that are about the same surrounding erythema. 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 No malignancy they did not stain for fungus 1/24; his right distal foot is not closed dry and scaly but the wound looks like it is contracting. I did not debride anything here. Problem on his left dorsal foot with expanding erythema. Apparently there were problems last week getting the Elesa Hacker however it is now available at the [facility] but [name] week later. He is using ketoconazole and Coloplast to the left buttock along with Hydrofera Blue this actually looks quite [name] bit better today. 1/31; right dorsal foot again is dry and scaly but looking to contract. He has been using [name] moisturizer on his feet at my request but he is not sure which 1. The left dorsal foot wounds look about the same there is erythema here that I marked last week however after course of Nuzyra it certainly is not any better but not any worse either. Finally on his left buttock the skin continues to look better he has the original wound but [name] new substantial area towards the gluteal cleft. Almost like [name] skin tear. I used scissors to remove skin and subcutaneous tissue here silver nitrate and direct pressure 2/7; right dorsal foot. This does not look too much different from last week. Some erythema skin dry and scaly. No debridement. Left dorsal great toe again still not much improvement. I did  remove flaking dry skin and callus from around the edge. Finally on his left buttock. The skin is somewhat better in the periwound. Surface wounds are superficial somewhat better than last week. 01/26/2022: Is [name] little bit of [name] mystery as to why his  wounds fail to respond to treatments and actually seem to get worse. This is my first encounter with this patient. He was previously followed by Dr. Dellia Nims. Based upon my review of the chart, it seems that there is [name] little bit of [name] mystery as to why his wounds do not respond as anticipated to the interventions applied and sometimes even get worse. Biopsies have been performed and he was seen by dermatology in [place], but that did not shed any light on the matter. T oday, his gluteal wound is larger, with substantial drainage, rather malodorous. The food wounds are not terrible, but he has [name] lot of callus and scaly skin around these. He is currently getting silver alginate on the gluteal wound, with idodoflex to the feet. He is using lotrisone on his legs for the dry, scaly skin. 02/09/2022: There has really been no change to any of his wounds. The gluteal wound less drainage and odor, but remains about the same size, the periwound skin remains oddly scaly. His lower extremity wounds also appear roughly the same size. They continue to accumulate [name] small amount of slough. The periwound on his feet and ankle wounds has dry eschar and loose dead skin. We have been using silver alginate on the gluteal wound and Iodoflex on his feet and ankle wounds. T the periwound around his gluteal lesion and Lotrisone on his feet and legs. o 02/23/2022: The right plantar foot wound is closed. The gluteal site looks small but has continued to produce hypertrophic granulation tissue. The foot wounds all look about the same on the dorsal surface of the right foot; on the left, there is only [name] small open area at the site of where his left great toenail would have been. 03/16/2022: The right ankle wound is healed. The right dorsal foot wound is about the same. The left dorsal foot wound is quite [name] bit smaller and the ischial wound is nearly closed. 03/30/2022: The right ankle wound reopened. Both dorsal foot wounds are quite [name]  bit smaller. Unfortunately, he appears to have sheared part of his ischial wound open further, perhaps during [name] transfer. 04/13/2022: The right ankle wound has hypertrophic granulation tissue present. The dorsal foot wounds continue to decrease in size. The ischial wound looks about the same today, no better, no worse. 04/27/2022: The right ankle wound has closed. Unfortunately, it looks like some moisture got underneath the dry skin on both of his dorsal feet and these wounds have expanded in size. The ischial wound remains the same with perhaps [name] little bit more slough accumulation than at our previous visit. 05/11/2022: The right ankle wound remains closed. There is [name] left anterior tibial wound that is small has patchy openings with accumulated slough. The dorsum of his right foot appears to be nearly healed with just [name] small punctate opening. The plantar surface of his right foot has [name] new opening that looks like he may have picked some skin there. His sacral ulcer has hypertrophic granulation tissue but has some slough accumulation. The dorsum of his left foot has multiple open areas in [name] fairly ragged distribution. All of these have slough accumulated within them. 06/01/2022: The right ankle and left anterior tibial wound are both closed.  Dorsum of his right foot and left foot both look substantially better with just tiny scattered openings The without any slough accumulation. He has sheared open new areas on his left gluteus and ischium. He says that his wheelchair cushion, which is air-filled, has [name] leak and so it keeps deflating. He is awaiting [name] new cushion. 06/15/2022: The right ankle wound has reopened and the fat layer is exposed. Both dorsal feet have just small openings with just [name] little bit of slough and eschar accumulation. The wound on his left gluteus and ischium is larger again today and has [name] foul odor. 06/29/2022: The right ankle wound has hypertrophic granulation tissue buildup. His  dorsal foot wounds both look better with just some eschar on the surface. He has [name] new wound on his left lateral ankle. He is not sure how he acquired it but by appearance, it looks that he hit it on something, potentially his wheelchair or bed. The ischial wound is about the same but is cleaner without any significant purulent drainage or odor. He did not understand what the [facility] call was about and therefore he does not have the topical compounded antibiotic. Patient History Information obtained from Patient. Family History Diabetes - Father, Hypertension - Mother, No family history of Cancer, Heart Disease, Hereditary Spherocytosis, Kidney Disease, Lung Disease, Stroke, Thyroid Problems, Tuberculosis. Social History Former smoker, Marital Status - Married, Alcohol Use - Moderate, Drug Use - No History, Caffeine Use - Daily. Medical History Ear/Nose/Mouth/Throat Denies history of Chronic sinus problems/congestion, Middle ear problems Hematologic/Lymphatic Denies history of Anemia, Hemophilia, Human Immunodeficiency Virus, Lymphedema, Sickle Cell Disease Respiratory Patient has history of Sleep Apnea - does not tolerate cpap Denies history of Aspiration, Asthma, Chronic Obstructive Pulmonary Disease (COPD), Pneumothorax, Tuberculosis Cardiovascular Patient has history of Hypertension - lisinopril HCTZ Denies history of Angina, Arrhythmia, Congestive Heart Failure, Coronary Artery Disease, Deep Vein Thrombosis, Hypotension, Myocardial Infarction, Peripheral Arterial Disease, Peripheral Venous Disease, Phlebitis, Vasculitis Gastrointestinal Denies history of Cirrhosis , Colitis, Crohnoos, Hepatitis [name], Hepatitis B, Hepatitis C Endocrine Denies history of Type I Diabetes, Type II Diabetes Genitourinary Denies history of End Stage Renal Disease Immunological Denies history of Lupus Erythematosus, Raynaudoos, Scleroderma Integumentary (Skin) Denies history of History of  Burn Musculoskeletal Denies history of Gout, Rheumatoid Arthritis, Osteoarthritis, Osteomyelitis Neurologic Patient has history of Paraplegia Denies history of Dementia, Neuropathy, Quadriplegia, Seizure Disorder Oncologic Denies history of Received Chemotherapy, Received Radiation Psychiatric Denies history of Anorexia/bulimia, Confinement Anxiety Hospitalization/Surgery History - cellulitis in leg. - left leg vein ablation. Objective Constitutional Tachycardic, asymptomatic.Marland Kitchen No acute distress.. Vitals Time Taken: 8:51 AM, Height: 70 in, Weight: 216 lbs, BMI: 31, Temperature: 98.2 F, Pulse: 120 bpm, Respiratory Rate: 18 breaths/min, Blood Pressure: 132/86 mmHg. Respiratory Normal work of breathing on room air.. General Notes: 06/29/2022: The right ankle wound has hypertrophic granulation tissue buildup. His dorsal foot wounds both look better with just some eschar on the surface. He has [name] new wound on his left lateral ankle. He is not sure how he acquired it but by appearance, it looks that he hit it on something, potentially his wheelchair or bed. The ischial wound is about the same but is cleaner without any significant purulent drainage or odor. Integumentary (Hair, Skin) Wound #41R status is Open. Original cause of wound was Gradually Appeared. The date acquired was: 03/16/2020. The wound has been in treatment 119 [name]. The wound is located on the Left Ischium. The wound measures 1.2cm length x 1.4cm width x 0.1cm depth; 1.319cm^2  area and 0.132cm^3 volume. There is Fat Layer (Subcutaneous Tissue) exposed. There is no tunneling or undermining noted. There is [name] medium amount of purulent drainage noted. The wound margin is distinct with the outline attached to the wound base. There is large (67-100%) red, pink, friable granulation within the wound bed. There is [name] small (1- 33%) amount of necrotic tissue within the wound bed including Adherent Slough. Wound #52 status is Open. Original  cause of wound was Gradually Appeared. The date acquired was: 03/27/2021. The wound has been in treatment 65 [name]. The wound is located on the Right,Dorsal Foot. The wound measures 0.9cm length x 4cm width x 0.1cm depth; 2.827cm^2 area and 0.283cm^3 volume. There is Fat Layer (Subcutaneous Tissue) exposed. There is no tunneling or undermining noted. There is [name] small amount of serous drainage noted. The wound margin is flat and intact. There is small (1-33%) red granulation within the wound bed. There is [name] large (67-100%) amount of necrotic tissue within the wound bed including Adherent Slough. Wound #56 status is Open. Original cause of wound was Gradually Appeared. The date acquired was: 07/11/2021. The wound has been in treatment 49 [name]. The wound is located on the Left,Dorsal Foot. The wound measures 1.7cm length x 1.5cm width x 0.1cm depth; 2.003cm^2 area and 0.2cm^3 volume. There is Fat Layer (Subcutaneous Tissue) exposed. There is no tunneling or undermining noted. There is [name] small amount of serous drainage noted. The wound margin is flat and intact. There is large (67-100%) red, pink granulation within the wound bed. There is [name] small (1-33%) amount of necrotic tissue within the wound bed including Adherent Slough. Wound #65 status is Open. Original cause of wound was Pressure Injury. The date acquired was: 05/24/2022. The wound has been in treatment 4 [name]. The wound is located on the Left Upper Leg. The wound measures 6.5cm length x 2.2cm width x 0.1cm depth; 11.231cm^2 area and 1.123cm^3 volume. There is Fat Layer (Subcutaneous Tissue) exposed. There is no tunneling or undermining noted. There is [name] large amount of purulent drainage noted. The wound margin is flat and intact. There is large (67-100%) red, pink, friable granulation within the wound bed. There is [name] small (1-33%) amount of necrotic tissue within the wound bed including Adherent Slough. Wound #66 status is Open. Original cause of  wound was Pressure Injury. The date acquired was: 06/15/2022. The wound has been in treatment 2 [name]. The wound is located on the Right,Anterior Ankle. The wound measures 1.2cm length x 1cm width x 0.1cm depth; 0.942cm^2 area and 0.094cm^3 volume. There is Fat Layer (Subcutaneous Tissue) exposed. There is no tunneling or undermining noted. There is [name] medium amount of serosanguineous drainage noted. The wound margin is flat and intact. There is large (67-100%) red, friable granulation within the wound bed. There is no necrotic tissue within the wound bed. Wound #67 status is Open. Original cause of wound was Gradually Appeared. The date acquired was: 06/22/2022. The wound is located on the Left,Lateral Lower Leg. The wound measures 5.1cm length x 1.4cm width x 0.1cm depth; 5.608cm^2 area and 0.561cm^3 volume. There is Fat Layer (Subcutaneous Tissue) exposed. There is no tunneling or undermining noted. There is [name] medium amount of serosanguineous drainage noted. The wound margin is flat and intact. There is small (1-33%) pink granulation within the wound bed. There is [name] large (67-100%) amount of necrotic tissue within the wound bed including Adherent Slough. 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 There was [name] Selective/Open Wound Non-Viable Tissue Debridement with [name] total  area of 1.68 sq cm performed by [name] Maudlin, MD. With the following instrument(s): Curette to remove Non-Viable tissue/material. Material removed includes Slough and Biofilm and. No specimens were taken. [name] time out was conducted at 09:30, prior to the start of the procedure. [name] Minimum amount of bleeding was controlled with Pressure. The procedure was tolerated well with [name] pain level of Insensate throughout and [name] pain level of Insensate following the procedure. Post Debridement Measurements: 1.2cm length x 1.4cm width x 0.1cm depth; 0.132cm^3 volume. Post debridement Stage noted as Category/Stage II. Character of Wound/Ulcer Post Debridement is improved. Post procedure Diagnosis Wound #41R: Same as Pre-Procedure Wound #52 Pre-procedure diagnosis of Wound #52 is [name] Trauma, Other located on the Right,Dorsal Foot . There was [name] Selective/Open Wound Non-Viable Tissue Debridement with [name] total area of 3.6 sq cm performed by [name] Maudlin, MD. With the following instrument(s): Curette to remove Non-Viable tissue/material. Material removed includes Eschar and Slough and. No specimens were taken. [name] time out was conducted at 09:30, prior to the start of the procedure. [name] Minimum amount of bleeding was controlled with Pressure. The procedure was tolerated well with [name] pain level of Insensate throughout and [name] pain level of Insensate following the procedure. Post Debridement Measurements: 0.9cm length x 4cm width x 0.1cm depth; 0.283cm^3 volume. Character of Wound/Ulcer Post Debridement is improved. 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 There was [name] Selective/Open Wound Non-Viable Tissue Debridement with [name] total area of 2.55 sq cm performed by [name] Maudlin, MD. With the following instrument(s): Curette to remove Non-Viable tissue/material. Material removed includes  Eschar. No specimens were taken. [name] time out was conducted at 09:30, prior to the start of the procedure. There was no bleeding. The procedure was tolerated well with [name] pain level of Insensate throughout and [name] pain level of Insensate following the procedure. Post Debridement Measurements: 1.7cm length x 1.5cm width x 0.1cm depth; 0.2cm^3 volume. Character of Wound/Ulcer Post Debridement is improved. Post procedure Diagnosis Wound #56: Same as Pre-Procedure Wound #65 Pre-procedure diagnosis of Wound #65 is [name] Pressure Ulcer located on the Left Upper Leg . There was [name] Selective/Open Wound Non-Viable Tissue Debridement with [name] total area of 14.3 sq cm performed by [name] Maudlin, MD. With the following instrument(s): Curette to remove Non-Viable tissue/material. Material removed includes Slough and Biofilm and. No specimens were taken. [name] time out was conducted at 09:30, prior to the start of the procedure. [name] Minimum amount of bleeding was controlled with Pressure. The procedure was tolerated well with [name] pain level of Insensate throughout and [name] pain level of Insensate following the procedure. Post Debridement Measurements: 6.5cm length x 2.2cm width x 0.1cm depth; 1.123cm^3 volume. Post debridement Stage noted as Category/Stage III. Character of Wound/Ulcer Post Debridement is improved. Post procedure Diagnosis Wound #  65: Same as Pre-Procedure Wound #67 Pre-procedure diagnosis of Wound #67 is [name] Venous Leg Ulcer located on the Left,Lateral Lower Leg .Severity of Tissue Pre Debridement is: Fat layer exposed. There was [name] Excisional Skin/Subcutaneous Tissue Debridement with [name] total area of 7.14 sq cm performed by [name] Maudlin, MD. With the following instrument(s): Curette to remove Viable and Non-Viable tissue/material. Material removed includes Subcutaneous Tissue and Slough and. No specimens were taken. [name] time out was conducted at 09:30, prior to the start of the procedure. [name] Minimum amount of  bleeding was controlled with Pressure. The procedure was tolerated well with [name] pain level of Insensate throughout and [name] pain level of Insensate following the procedure. Post Debridement Measurements: 5.1cm length x 1.4cm width x 0.1cm depth; 0.561cm^3 volume. Character of Wound/Ulcer Post Debridement is improved. Severity of Tissue Post Debridement is: Fat layer exposed. Post procedure Diagnosis Wound #67: Same as Pre-Procedure Wound #66 Pre-procedure diagnosis of Wound #66 is [name] Venous Leg Ulcer located on the Right,Anterior Ankle . An Chemical Cauterization procedure was performed by [name] Maudlin, MD. Post procedure Diagnosis Wound #66: Same as Pre-Procedure Notes: using silver nitrate stick Plan Follow-up Appointments: Return Appointment in 2 [name]. - Dr Celine Ahr - Room 1 Tuesday 07/13/2022 @ 8:15pm Bathing/ Shower/ Hygiene: May shower and wash wound with soap and water. - on days that dressing is changed Edema Control - Lymphedema / SCD / Other: Elevate legs to the level of the heart or above for 30 minutes daily and/or when sitting, [name] frequency of: - throughout the day Moisturize legs daily. - using Aquaphor generously to both legs and feet with dressing changes Compression stocking or Garment 30-40 mm/Hg pressure to: - Juxtalite to both legs daily Off-Loading: Roho cushion for wheelchair Turn and reposition every 2 hours WOUND #41R: - Ischium Wound Laterality: Left Cleanser: Soap and Water Every Other Day/30 Days Discharge Instructions: May shower and wash wound with dial antibacterial soap and water prior to dressing change. Peri-Wound Care: Zinc Oxide Ointment 30g tube Every Other Day/30 Days Discharge Instructions: Apply Zinc Oxide to periwound with each dressing change Topical: Gentamicin Every Other Day/30 Days Discharge Instructions: As directed by physician Topical: Keystone antibiotic compound Every Other Day/30 Days Discharge Instructions: thin layer to wound bed, use  daily once available Prim Dressing: KerraCel Ag Gelling Fiber Dressing, 4x5 in (silver alginate) (Generic) Every Other Day/30 Days ary Discharge Instructions: Apply silver alginate to wound bed as instructed Secondary Dressing: ALLEVYN Gentle Border, 4x4 (in/in) Every Other Day/30 Days Discharge Instructions: Apply over primary dressing as directed. WOUND #52: - Foot Wound Laterality: Dorsal, Right Cleanser: Soap and Water Every Other Day/30 Days Discharge Instructions: May shower and wash wound with dial antibacterial soap and water prior to dressing change. Peri-Wound Care: Triamcinolone 15 (g) Every Other Day/30 Days Discharge Instructions: Use triamcinolone 15 (g) as directed Peri-Wound Care: Sween Lotion (Moisturizing lotion) Every Other Day/30 Days Discharge Instructions: Apply Aquaphor moisturizing lotion as directed Peri-Wound Care: Lotrisone Every Other Day/30 Days Discharge Instructions: Apply Lotrisone to periwound Prim Dressing: KerraCel Ag Gelling Fiber Dressing, 2x2 in (silver alginate) Every Other Day/30 Days ary Discharge Instructions: Apply silver alginate to wound bed as instructed Secondary Dressing: Woven Gauze Sponge, Non-Sterile 4x4 in Every Other Day/30 Days Discharge Instructions: Apply over primary dressing as directed. Secured With: [employer], 4.5x3.1 (in/yd) (Generic) Every Other Day/30 Days Discharge Instructions: Secure with Kerlix as directed. Secured With: 54M Medipore H Soft Cloth Surgical T ape, 4 x 10 (in/yd) (Generic) Every  Other Day/30 Days Discharge Instructions: Secure with tape as directed. Com pression Stockings: Circaid Juxta Lite Compression Wrap Compression Amount: 30-40 mmHg (right) Discharge Instructions: Apply Circaid Juxta Lite Compression Wrap daily as instructed. Apply first thing in the morning, remove at night before bed. WOUND #56: - Foot Wound Laterality: Dorsal, Left Cleanser: Soap and Water Every Other Day/30 Days Discharge  Instructions: May shower and wash wound with dial antibacterial soap and water prior to dressing change. Peri-Wound Care: Triamcinolone 15 (g) Every Other Day/30 Days Discharge Instructions: Use triamcinolone 15 (g) as directed Peri-Wound Care: Sween Lotion (Moisturizing lotion) Every Other Day/30 Days Discharge Instructions: Apply Aquaphor moisturizing lotion as directed Peri-Wound Care: Lotrisone Every Other Day/30 Days Discharge Instructions: Apply Lotrisone to periwound Prim Dressing: KerraCel Ag Gelling Fiber Dressing, 2x2 in (silver alginate) Every Other Day/30 Days ary Discharge Instructions: Apply silver alginate to wound bed as instructed Secondary Dressing: Woven Gauze Sponge, Non-Sterile 4x4 in Every Other Day/30 Days Discharge Instructions: Apply over primary dressing as directed. Secured With: [employer], 4.5x3.1 (in/yd) (Generic) Every Other Day/30 Days Discharge Instructions: Secure with Kerlix as directed. Secured With: 47M Medipore H Soft Cloth Surgical T ape, 4 x 10 (in/yd) (Generic) Every Other Day/30 Days Discharge Instructions: Secure with tape as directed. WOUND #65: - Upper Leg Wound Laterality: Left Cleanser: Soap and Water Every Other Day/30 Days Discharge Instructions: May shower and wash wound with dial antibacterial soap and water prior to dressing change. Peri-Wound Care: Zinc Oxide Ointment 30g tube Every Other Day/30 Days Discharge Instructions: Apply Zinc Oxide to periwound with each dressing change Topical: Gentamicin Every Other Day/30 Days Discharge Instructions: As directed by physician Topical: Keystone antibiotic compound Every Other Day/30 Days Discharge Instructions: thin layer to wound bed, use daily once available Prim Dressing: KerraCel Ag Gelling Fiber Dressing, 4x5 in (silver alginate) (Generic) Every Other Day/30 Days ary Discharge Instructions: Apply silver alginate to wound bed as instructed Secondary Dressing: ALLEVYN Gentle Border,  4x4 (in/in) Every Other Day/30 Days Discharge Instructions: Apply over primary dressing as directed. WOUND #66: - Ankle Wound Laterality: Right, Anterior Cleanser: Soap and Water Every Other Day/30 Days Discharge Instructions: May shower and wash wound with dial antibacterial soap and water prior to dressing change. Peri-Wound Care: Triamcinolone 15 (g) Every Other Day/30 Days Discharge Instructions: Use triamcinolone 15 (g) as directed Peri-Wound Care: Sween Lotion (Moisturizing lotion) Every Other Day/30 Days Discharge Instructions: Apply Aquaphor moisturizing lotion as directed Peri-Wound Care: Lotrisone Every Other Day/30 Days Discharge Instructions: Apply Lotrisone to periwound Prim Dressing: KerraCel Ag Gelling Fiber Dressing, 2x2 in (silver alginate) Every Other Day/30 Days ary Discharge Instructions: Apply silver alginate to wound bed as instructed Secondary Dressing: Woven Gauze Sponge, Non-Sterile 4x4 in Every Other Day/30 Days Discharge Instructions: Apply over primary dressing as directed. Secured With: [employer], 4.5x3.1 (in/yd) (Generic) Every Other Day/30 Days Discharge Instructions: Secure with Kerlix as directed. Secured With: 47M Medipore H Soft Cloth Surgical T ape, 4 x 10 (in/yd) (Generic) Every Other Day/30 Days Discharge Instructions: Secure with tape as directed. WOUND #67: - Lower Leg Wound Laterality: Left, Lateral Cleanser: Soap and Water Every Other Day/30 Days Discharge Instructions: May shower and wash wound with dial antibacterial soap and water prior to dressing change. Peri-Wound Care: Triamcinolone 15 (g) Every Other Day/30 Days Discharge Instructions: Use triamcinolone 15 (g) as directed Peri-Wound Care: Sween Lotion (Moisturizing lotion) Every Other Day/30 Days Discharge Instructions: Apply Aquaphor moisturizing lotion as directed Peri-Wound Care: Lotrisone Every Other Day/30 Days Discharge  Instructions: Apply Lotrisone to periwound Prim  Dressing: KerraCel Ag Gelling Fiber Dressing, 2x2 in (silver alginate) Every Other Day/30 Days ary Discharge Instructions: Apply silver alginate to wound bed as instructed Secondary Dressing: Woven Gauze Sponge, Non-Sterile 4x4 in Every Other Day/30 Days Discharge Instructions: Apply over primary dressing as directed. Secured With: [employer], 4.5x3.1 (in/yd) (Generic) Every Other Day/30 Days Discharge Instructions: Secure with Kerlix as directed. Secured With: 21M Medipore H Soft Cloth Surgical T ape, 4 x 10 (in/yd) (Generic) Every Other Day/30 Days Discharge Instructions: Secure with tape as directed. 06/29/2022: The right ankle wound has hypertrophic granulation tissue buildup. His dorsal foot wounds both look better with just some eschar on the surface. He has [name] new wound on his left lateral ankle. He is not sure how he acquired it but by appearance, it looks that he hit it on something, potentially his wheelchair or bed. The ischial wound is about the same but is cleaner without any significant purulent drainage or odor. I used silver nitrate to chemically cauterize the hypertrophic granulation tissue on the right ankle wound. I also removed the eschar from his dorsal foot wounds. I removed slough and nonviable subcutaneous tissue from the new left ankle wound. I also removed slough and biofilm from his ischial wound. We will continue to use topical gentamicin with silver alginate to all of his wounds for now. Hopefully, by next visit, he will have his [facility] topical compounded antibiotic. I will see him in 2 [name]' time. Electronic Signature(s) Signed: 06/29/2022 9:44:05 AM By: [name] Maudlin MD FACS Entered By: [name] [name] on 06/29/2022 09:44:05 -------------------------------------------------------------------------------- HxROS Details Patient Name: Date of Service: Ishibashi, [name] [name] 06/29/2022 8:15 [name] [name] Medical Record Number: [id number] Patient Account Number:  [id number] Date of Birth/Sex: Treating RN: July 21, 1986 (36 y.o. [name] [name] Primary Care Provider: Oakwood, Doon Other Clinician: Referring Provider: Treating Provider/Extender: [name] [name] in Treatment: 338 Information Obtained From Patient Ear/Nose/Mouth/Throat Medical History: Negative for: Chronic sinus problems/congestion; Middle ear problems Hematologic/Lymphatic Medical History: Negative for: Anemia; Hemophilia; Human Immunodeficiency Virus; Lymphedema; Sickle Cell Disease Respiratory Medical History: Positive for: Sleep Apnea - does not tolerate cpap Negative for: Aspiration; Asthma; Chronic Obstructive Pulmonary Disease (COPD); Pneumothorax; Tuberculosis Cardiovascular Medical History: Positive for: Hypertension - lisinopril HCTZ Negative for: Angina; Arrhythmia; Congestive Heart Failure; Coronary Artery Disease; Deep Vein Thrombosis; Hypotension; Myocardial Infarction; Peripheral Arterial Disease; Peripheral Venous Disease; Phlebitis; Vasculitis Gastrointestinal Medical History: Negative for: Cirrhosis ; Colitis; Crohns; Hepatitis [name]; Hepatitis B; Hepatitis C Endocrine Medical History: Negative for: Type I Diabetes; Type II Diabetes Genitourinary Medical History: Negative for: End Stage Renal Disease Immunological Medical History: Negative for: Lupus Erythematosus; Raynauds; Scleroderma Integumentary (Skin) Medical History: Negative for: History of Burn Musculoskeletal Medical History: Negative for: Gout; Rheumatoid Arthritis; Osteoarthritis; Osteomyelitis Neurologic Medical History: Positive for: Paraplegia Negative for: Dementia; Neuropathy; Quadriplegia; Seizure Disorder Oncologic Medical History: Negative for: Received Chemotherapy; Received Radiation Psychiatric Medical History: Negative for: Anorexia/bulimia; Confinement Anxiety Immunizations Pneumococcal Vaccine: Received Pneumococcal Vaccination: No Immunization  Notes: doesn't remember when last tetanus shot was Implantable Devices No devices added Hospitalization / Surgery History Type of Hospitalization/Surgery cellulitis in leg left leg vein ablation Family and Social History Cancer: No; Diabetes: Yes - Father; Heart Disease: No; Hereditary Spherocytosis: No; Hypertension: Yes - Mother; Kidney Disease: No; Lung Disease: No; Stroke: No; Thyroid Problems: No; Tuberculosis: No; Former smoker; Marital Status - Married; Alcohol Use: Moderate; Drug Use: No History; Caffeine Use: Daily; Financial Concerns: No; Food, Clothing or  Shelter Needs: No; Support System Lacking: No; Transportation Concerns: No Electronic Signature(s) Signed: 06/29/2022 9:59:22 AM By: [name] Maudlin MD FACS Signed: 06/29/2022 6:13:27 PM By: [name] Gouty RN, BSN Entered By: [name] [name] on 06/29/2022 09:41:17 -------------------------------------------------------------------------------- [facility] Details Patient Name: Date of Service: Satre, [name] [name] 06/29/2022 Medical Record Number: [id number] Patient Account Number: [id number] Date of Birth/Sex: Treating RN: [birth year] (36 y.o. 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, 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 Non-pressure chronic ulcer of other part of left foot limited to breakdown of skin L97.312 Non-pressure chronic ulcer of right 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 L97.322 Non-pressure chronic ulcer of left ankle  with fat layer exposed Facility Procedures CPT4 Code: 68115726 Description: 11042 - DEB SUBQ TISSUE 20 SQ CM/< ICD-10 Diagnosis Description L97.322 Non-pressure chronic ulcer of left 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 Modifier: Quantity: 1 CPT4 Code: [id number] Description: 16384 - DEBRIDE WOUND 1ST 20 SQ CM OR < ICD-10 Diagnosis Description L97.521 Non-pressure chronic ulcer of other part of left foot limited to breakdown of ski L97.518 Non-pressure chronic ulcer of other part of right foot with other  specified sever 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, stage 3 L97.511 Non-pressure chronic ulcer of other part of right foot limited to breakdown of sk Modifier: n ity in Quantity: 1 CPT4 Code: [id number] Description: 97598 - DEBRIDE WOUND EA ADDL 20 SQ CM ICD-10 Diagnosis Description L97.511 Non-pressure chronic ulcer of other part of right foot limited to breakdown of sk 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, stage 3 L97.518 Non-pressure chronic ulcer  of other part of right foot with other specified sever L97.521 Non-pressure chronic ulcer of other part of left foot limited to breakdown of ski Modifier: in ity n Quantity: 1 CPT4 Code: [id number] Description: 17250 - CHEM CAUT GRANULATION TISS ICD-10 Diagnosis Description L97.312 Non-pressure chronic ulcer of right 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 Modifier: Quantity: 1 Physician Procedures : CPT4 Code Description Modifier [id number] - WC PHYS LEVEL 4 - EST PT 25 ICD-10 Diagnosis Description 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  L97.521 Non-pressure chronic ulcer of other part of left foot limited to breakdown of 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, stage 3 Quantity: 1 : [id number] 11042 - WC PHYS SUBQ TISS 20 SQ CM ICD-10 Diagnosis Description L97.322 Non-pressure chronic ulcer of left 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 Quantity: 1 : [id number]  97597 - WC PHYS DEBR WO ANESTH 20 SQ CM ICD-10 Diagnosis Description L97.521 Non-pressure chronic ulcer of other part of left foot limited to breakdown of skin L97.518 Non-pressure chronic ulcer of other part of right foot with other specified  severity 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, 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 Quantity: 1 : [id number] 97598 - WC PHYS DEBR WO ANESTH EA ADD 20 CM ICD-10 Diagnosis Description L97.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

## 2022-06-29 NOTE — Progress Notes (Signed)
[name], [name] ([id number]) Visit Report for 06/29/2022 Arrival Information Details Patient Name: Date of Service: [name], [name] Side 06/29/2022 8:15 [name] Medical Record Number: [id number] Patient Account Number: [id number] Date of Birth/Sex: Treating RN: Oct 03, 1986 (36 y.o. [name] Primary Care Evann Erazo: [place], [place] Other Clinician: Referring Bethel Gaglio: Treating Wilford Merryfield/Extender: Cornelia Copa [name]: 75 Visit Information History Since Last Visit Added or deleted any medications: No Patient Arrived: Wheel Chair Any new allergies or adverse reactions: No Arrival Time: 08:48 Had [name] fall or experienced change in No Accompanied By: self activities of daily living that may affect Transfer Assistance: None risk of falls: Patient Identification Verified: Yes Signs or symptoms of abuse/neglect since last visito No Patient Requires Transmission-Based Precautions: No Hospitalized since last visit: No Patient Has Alerts: Yes Implantable device outside of the clinic excluding No Patient Alerts: R ABI = 1.0 cellular tissue based products placed in the center L ABI = 1.1 since last visit: Has Dressing in Place as Prescribed: Yes Has Compression in Place as Prescribed: Yes Pain Present Now: No Electronic Signature(s) Signed: 06/29/2022 6:13:27 PM By: Baruch Gouty RN, BSN Entered By: Baruch Gouty on 06/29/2022 08:50:10 -------------------------------------------------------------------------------- Encounter Discharge Information Details Patient Name: Date of Service: [name], [name] 06/29/2022 8:15 [name] Medical Record Number: [id number] Patient Account Number: [id number] Date of Birth/Sex: Treating RN: 1986/09/23 (36 y.o. [name] Primary Care Alenna Russell: [place], [place] Other Clinician: Referring Raeana Blinn: Treating Nicky Kras/Extender: Cornelia Copa [name]: [phone] Encounter Discharge Information Items Post Procedure  Vitals Discharge Condition: Stable Temperature (F): 98.2 Ambulatory Status: Wheelchair Pulse (bpm): 120 Discharge Destination: Home Respiratory Rate (breaths/min): 18 Transportation: Private Auto Blood Pressure (mmHg): 132/82 Accompanied By: self Schedule Follow-up Appointment: Yes Clinical Summary of Care: Patient Declined Electronic Signature(s) Signed: 06/29/2022 6:13:27 PM By: Baruch Gouty RN, BSN Entered By: Baruch Gouty on 06/29/2022 09:57:45 -------------------------------------------------------------------------------- Lower Extremity Assessment Details Patient Name: Date of Service: [name], [name] 06/29/2022 8:15 [name] Medical Record Number: [id number] Patient Account Number: [id number] Date of Birth/Sex: Treating RN: July 18, 1986 (36 y.o. [name] Primary Care Jailee Jaquez: [place], [place] Other Clinician: Referring Minsa Weddington: Treating Lurae Hornbrook/Extender: Graciela Husbands, [name]: 338 Edema Assessment Assessed: [Left: No] [Right: No] Edema: [Left: Yes] [Right: Yes] Calf Left: Right: Point of Measurement: 33 cm From Medial Instep 30.5 cm 35.5 cm Ankle Left: Right: Point of Measurement: 10 cm From Medial Instep 26 cm 24.8 cm Vascular Assessment Pulses: Dorsalis Pedis Palpable: [Left:Yes] [Right:Yes] Electronic Signature(s) Signed: 06/29/2022 6:13:27 PM By: Baruch Gouty RN, BSN Entered By: Baruch Gouty on 06/29/2022 09:01:00 -------------------------------------------------------------------------------- Multi Wound Chart Details Patient Name: Date of Service: [name], [name] 06/29/2022 8:15 [name] Medical Record Number: [id number] Patient Account Number: [id number] Date of Birth/Sex: Treating RN: [birth year] (36 y.o. [name] Primary Care Wrenna Saks: [name], [name] Other Clinician: Referring Kehaulani Fruin: Treating Bensyn Bornemann/Extender: Graciela Husbands, [name]: 338 Vital Signs Height(in):  70 Pulse(bpm): 120 Weight(lbs): 216 Blood Pressure(mmHg): 132/86 Body Mass Index(BMI): 31 Temperature(F): 98.2 Respiratory Rate(breaths/min): 18 Photos: Left Ischium Right, Dorsal Foot Left, Dorsal Foot Wound Location: Gradually Appeared Gradually Appeared Gradually Appeared Wounding Event: Pressure Ulcer Trauma, Other Neuropathic Ulcer-Non Diabetic Primary Etiology: Sleep Apnea, Hypertension, Paraplegia Sleep Apnea, Hypertension, Paraplegia Sleep Apnea, Hypertension, Paraplegia Comorbid History: 03/16/2020 03/27/2021 07/11/2021 Date Acquired: 119 65 60 Weeks of Treatment: Open Open Open Wound Status: Yes No No Wound Recurrence: Yes No Yes Clustered Wound: 3 [name] [name] Clustered Quantity: 1.2x1.4x0.1  0.9x4x0.1 1.7x1.5x0.1 Measurements L x W x D (cm) 1.319 2.827 2.003 [name] (cm) : rea 0.132 0.283 0.2 Volume (cm) : 27.00% -50.00% 67.80% % Reduction in [name] rea: 27.10% -50.50% 67.80% % Reduction in Volume: Category/Stage II Full Thickness Without Exposed Full Thickness Without Exposed Classification: Support Structures Support Structures Medium Small Small Exudate [name] mount: Purulent Serous Serous Exudate Type: yellow, brown, green amber amber Exudate Color: Distinct, outline attached Flat and Intact Flat and Intact Wound Margin: Large (67-100%) Small (1-33%) Large (67-100%) Granulation [name] mount: Red, Pink, Friable Red Red, Pink Granulation Quality: Small (1-33%) Large (67-100%) Small (1-33%) Necrotic [name] mount: Fat Layer (Subcutaneous Tissue): Yes Fat Layer (Subcutaneous Tissue): Yes Fat Layer (Subcutaneous Tissue): Yes Exposed Structures: Fascia: No Fascia: No Fascia: No Tendon: No Tendon: No Tendon: No Muscle: No Muscle: No Muscle: No Joint: No Joint: No Joint: No Bone: No Bone: No Bone: No None Large (67-100%) Large (67-100%) Epithelialization: Debridement - Selective/Open Wound Debridement - Selective/Open Wound Debridement - Selective/Open  Wound Debridement: Pre-procedure Verification/Time Out 09:30 09:30 09:30 Taken: Psychologist, prison and probation services, [employer] Necrotic/Eschar Tissue Debrided: Non-Viable Tissue Non-Viable Tissue Non-Viable Tissue Level: 1.68 3.6 2.55 Debridement [name] (sq cm): rea Curette Curette Curette Instrument: Minimum Minimum None Bleeding: Pressure Pressure [name] Hemostasis [name] chieved: Insensate Insensate Insensate Procedural Pain: Insensate Insensate Insensate Post Procedural Pain: Procedure was tolerated well Procedure was tolerated well Procedure was tolerated well Debridement Treatment Response: 1.2x1.4x0.1 0.9x4x0.1 1.7x1.5x0.1 Post Debridement Measurements L x W x D (cm) 0.132 0.283 0.2 Post Debridement Volume: (cm) [name] Debridement Debridement Procedures Performed: Wound Number: [id number] Photos: Left Upper Leg Right, Anterior Ankle Left, Lateral Lower Leg Wound Location: Pressure Injury Pressure Injury Gradually Appeared Wounding Event: Pressure Ulcer Venous Leg Ulcer Venous Leg Ulcer Primary Etiology: Sleep Apnea, Hypertension, Paraplegia Sleep Apnea, Hypertension, Paraplegia Sleep Apnea, Hypertension, Paraplegia Comorbid History: 05/24/2022 06/15/2022 06/22/2022 Date Acquired: 4 2 0 Weeks of Treatment: Open Open Open Wound Status: No No No Wound Recurrence: No No No Clustered Wound: [name] [name] [name] Clustered Quantity: 6.5x2.2x0.1 1.2x1x0.1 5.1x1.4x0.1 Measurements L x W x D (cm) 11.231 0.942 5.608 [name] (cm) : rea 1.123 0.094 0.561 Volume (cm) : -78.80% 25.90% -70.00% % Reduction in [name] rea: -78.80% 26.00% -70.00% % Reduction in Volume: Category/Stage III Full Thickness Without Exposed Full Thickness Without Exposed Classification: Support Structures Support Structures Large Medium Medium Exudate Amount: Purulent Serosanguineous Serosanguineous Exudate Type: yellow, brown, green red, brown red, brown Exudate Color: Flat and Intact Flat and Intact Flat and Intact Wound  Margin: Large (67-100%) Large (67-100%) Small (1-33%) Granulation Amount: Red, Pink, Friable Red, Friable Pink Granulation Quality: Small (1-33%) None Present (0%) Large (67-100%) Necrotic Amount: Fat Layer (Subcutaneous Tissue): Yes Fat Layer (Subcutaneous Tissue): Yes Fat Layer (Subcutaneous Tissue): Yes Exposed Structures: Fascia: No Fascia: No Fascia: No Tendon: No Tendon: No Tendon: No Muscle: No Muscle: No Muscle: No Joint: No Joint: No Joint: No Bone: No Bone: No Bone: No Small (1-33%) Small (1-33%) None Epithelialization: Debridement - Selective/Open Wound [name] Debridement - Excisional Debridement: Pre-procedure Verification/Time Out 09:30 [name] 09:30 Taken: Slough [name] Subcutaneous, Slough Tissue Debrided: Non-Viable Tissue [name] Skin/Subcutaneous Tissue Level: 14.3 [name] 7.14 Debridement [name] (sq cm): rea Curette [name] Curette Instrument: Minimum [name] Minimum Bleeding: Pressure [name] Pressure Hemostasis [name] chieved: Insensate [name] Insensate Procedural Pain: Insensate [name] Insensate Post Procedural Pain: Procedure was tolerated well [name] Procedure was tolerated well Debridement Treatment Response: 6.5x2.2x0.1 [name] 5.1x1.4x0.1 Post Debridement Measurements L x W x D (cm) 1.123 [name] 0.561 Post Debridement Volume: (  cm) [name] Chemical Cauterization Debridement Procedures Performed: Treatment Notes Electronic Signature(s) Signed: 06/29/2022 9:36:06 AM By: Fredirick Maudlin MD FACS Signed: 06/29/2022 6:13:27 PM By: Baruch Gouty RN, BSN Entered By: Fredirick Maudlin on 06/29/2022 09:36:06 -------------------------------------------------------------------------------- Multi-Disciplinary Care Plan Details Patient Name: Date of Service: [name], [name] 06/29/2022 8:15 [name] Medical Record Number: [id number] Patient Account Number: [id number] Date of Birth/Sex: Treating RN: 1986/08/22 (36 y.o. [name] Primary Care Cydni Reddoch: [name], [name] Other Clinician: Referring  Addeline Calarco: Treating Marykay Mccleod/Extender: Cornelia Copa [name]: [place] reviewed with physician Active Inactive Pressure Nursing Diagnoses: Knowledge deficit related to management of pressures ulcers Potential for impaired tissue integrity related to pressure, friction, moisture, and shear Goals: Patient will remain free from development of additional pressure ulcers Date Initiated: 06/21/2018 Date Inactivated: 02/12/2019 Target Resolution Date: 02/16/2019 Unmet Reason: pressure ulcer Goal Status: Unmet reopened on left ischium Patient/caregiver will verbalize risk factors for pressure ulcer development Date Initiated: 06/21/2018 Date Inactivated: 05/28/2019 Target Resolution Date: 06/01/2019 Goal Status: Met Patient/caregiver will verbalize understanding of pressure ulcer management Date Initiated: 06/01/2022 Target Resolution Date: 07/27/2022 Goal Status: Active Interventions: Assess: immobility, friction, shearing, incontinence upon admission and as needed Assess offloading mechanisms upon admission and as needed Assess potential for pressure ulcer upon admission and as needed Treatment Activities: Pressure reduction/relief device ordered : 12/05/2017 Notes: Wound/Skin Impairment Nursing Diagnoses: Impaired tissue integrity Knowledge deficit related to ulceration/compromised skin integrity Goals: Patient/caregiver will verbalize understanding of skin care regimen Date Initiated: 06/01/2022 Target Resolution Date: 07/27/2022 Goal Status: Active Ulcer/skin breakdown will have [name] volume reduction of 30% by week 4 Date Initiated: 06/01/2022 Date Inactivated: 06/29/2022 Target Resolution Date: 06/29/2022 Unmet Reason: complex wounds, Goal Status: Unmet infection Interventions: Assess patient/caregiver ability to obtain necessary supplies Assess ulceration(s) every visit Provide education on ulcer and skin care Notes: 02/02/21: Complex  Care, ongoing. Electronic Signature(s) Signed: 06/29/2022 6:13:27 PM By: Baruch Gouty RN, BSN Entered By: Baruch Gouty on 06/29/2022 09:22:56 -------------------------------------------------------------------------------- Pain Assessment Details Patient Name: Date of Service: [name], [name] 06/29/2022 8:15 [name] Medical Record Number: [id number] Patient Account Number: [id number] Date of Birth/Sex: Treating RN: 11-05-1986 (36 y.o. [name] Primary Care Addley Ballinger: [place], [place] Other Clinician: Referring Delita Chiquito: Treating Aubriel Khanna/Extender: Cornelia Copa [name]: 90 Active Problems Location of Pain Severity and Description of Pain Patient Has Paino No Site Locations Rate the pain. Current Pain Level: 0 Pain Management and Medication Current Pain Management: Electronic Signature(s) Signed: 06/29/2022 6:13:27 PM By: Baruch Gouty RN, BSN Entered By: Baruch Gouty on 06/29/2022 08:54:27 -------------------------------------------------------------------------------- Patient/Caregiver Education Details Patient Name: Date of Service: [name], [name] 8/8/2023andnbsp8:15 [name] Medical Record Number: [id number] Patient Account Number: [id number] Date of Birth/Gender: Treating RN: 17-Jun-1986 (36 y.o. [name] Primary Care Physician: Janine Limbo Other Clinician: Referring Physician: Treating Physician/Extender: Cornelia Copa [name]: 58 Education Assessment Education Provided To: Patient Education Topics Provided Pressure: Methods: Explain/Verbal Responses: Reinforcements needed, State content correctly Venous: Methods: Explain/Verbal Responses: Reinforcements needed, State content correctly Wound/Skin Impairment: Methods: Explain/Verbal Responses: Reinforcements needed, State content correctly Electronic Signature(s) Signed: 06/29/2022 6:13:27 PM By: Baruch Gouty RN, BSN Entered By:  Baruch Gouty on 06/29/2022 67:61:95 -------------------------------------------------------------------------------- Wound Assessment Details Patient Name: Date of Service: [name], [name] 06/29/2022 8:15 [name] Medical Record Number: [id number] Patient Account Number: [id number] Date of Birth/Sex: Treating RN: 1986-11-21 (36 y.o. [name] Primary Care Kelley Knoth: [name], [name] Other Clinician: Referring Farah Lepak: Treating Tyne Banta/Extender:  Fredirick Maudlin [name], [name]: 338 Wound Status Wound Number: [id number] Primary Etiology: Pressure Ulcer Wound Location: Left Ischium Wound Status: Open Wounding Event: Gradually Appeared Comorbid History: Sleep Apnea, Hypertension, Paraplegia Date Acquired: 03/16/2020 Weeks Of Treatment: 119 Clustered Wound: Yes Photos Wound Measurements Length: (cm) 1.2 Width: (cm) 1.4 Depth: (cm) 0.1 Clustered Quantity: 3 Area: (cm) 1. Volume: (cm) 0. % Reduction in Area: 27% % Reduction in Volume: 27.1% Epithelialization: None Tunneling: No 319 Undermining: No 132 Wound Description Classification: Category/Stage II Wound Margin: Distinct, outline attached Exudate Amount: Medium Exudate Type: Purulent Exudate Color: yellow, brown, green Foul Odor After Cleansing: No Slough/Fibrino Yes Wound Bed Granulation Amount: Large (67-100%) Exposed Structure Granulation Quality: Red, Pink, Friable Fascia Exposed: No Necrotic Amount: Small (1-33%) Fat Layer (Subcutaneous Tissue) Exposed: Yes Necrotic Quality: Adherent Slough Tendon Exposed: No Muscle Exposed: No Joint Exposed: No Bone Exposed: No Treatment Notes Wound #[id number] (Ischium) Wound Laterality: Left Cleanser Soap and Water Discharge Instruction: May shower and wash wound with dial antibacterial soap and water prior to dressing change. Peri-Wound Care Zinc Oxide Ointment 30g tube Discharge Instruction: Apply Zinc Oxide to periwound with each dressing  change Topical Gentamicin Discharge Instruction: As directed by physician [facility] antibiotic compound Discharge Instruction: thin layer to wound bed, use daily once available Primary Dressing KerraCel Ag Gelling Fiber Dressing, 4x5 in (silver alginate) Discharge Instruction: Apply silver alginate to wound bed as instructed Secondary Dressing ALLEVYN Gentle Border, 4x4 (in/in) Discharge Instruction: Apply over primary dressing as directed. Secured With Compression Wrap Compression Stockings Environmental education officer) Signed: 06/29/2022 6:13:27 PM By: Baruch Gouty RN, BSN Entered By: Baruch Gouty on 06/29/2022 09:16:26 -------------------------------------------------------------------------------- Wound Assessment Details Patient Name: Date of Service: [name], [name] 06/29/2022 8:15 [name] Medical Record Number: [id number] Patient Account Number: [id number] Date of Birth/Sex: Treating RN: [birth year] (36 y.o. [name] Primary Care Katrianna Friesenhahn: [name], [name] Other Clinician: Referring Key Cen: Treating Shlomie Romig/Extender: Graciela Husbands, [name]: 338 Wound Status Wound Number: 52 Primary Etiology: Trauma, Other Wound Location: Right, Dorsal Foot Wound Status: Open Wounding Event: Gradually Appeared Comorbid History: Sleep Apnea, Hypertension, Paraplegia Date Acquired: 03/27/2021 Weeks Of Treatment: 65 Clustered Wound: No Photos Wound Measurements Length: (cm) 0.9 Width: (cm) 4 Depth: (cm) 0.1 Area: (cm) 2.827 Volume: (cm) 0.283 % Reduction in Area: -50% % Reduction in Volume: -50.5% Epithelialization: Large (67-100%) Tunneling: No Undermining: No Wound Description Classification: Full Thickness Without Exposed Support Structures Wound Margin: Flat and Intact Exudate Amount: Small Exudate Type: Serous Exudate Color: amber Foul Odor After Cleansing: No Slough/Fibrino No Wound Bed Granulation Amount: Small (1-33%) Exposed  Structure Granulation Quality: Red Fascia Exposed: No Necrotic Amount: Large (67-100%) Fat Layer (Subcutaneous Tissue) Exposed: Yes Necrotic Quality: Adherent Slough Tendon Exposed: No Muscle Exposed: No Joint Exposed: No Bone Exposed: No Treatment Notes Wound #52 (Foot) Wound Laterality: Dorsal, Right Cleanser Soap and Water Discharge Instruction: May shower and wash wound with dial antibacterial soap and water prior to dressing change. Peri-Wound Care Triamcinolone 15 (g) Discharge Instruction: Use triamcinolone 15 (g) as directed Sween Lotion (Moisturizing lotion) Discharge Instruction: Apply Aquaphor moisturizing lotion as directed Lotrisone Discharge Instruction: Apply Lotrisone to periwound Topical Primary Dressing KerraCel Ag Gelling Fiber Dressing, 2x2 in (silver alginate) Discharge Instruction: Apply silver alginate to wound bed as instructed Secondary Dressing Woven Gauze Sponge, Non-Sterile 4x4 in Discharge Instruction: Apply over primary dressing as directed. Secured With [employer], 4.5x3.1 (in/yd) Discharge Instruction: Secure with Kerlix as directed. 83M Medipore H Soft  Cloth Surgical T ape, 4 x 10 (in/yd) Discharge Instruction: Secure with tape as directed. Compression Wrap Compression Stockings Circaid Juxta Lite Compression Wrap Quantity: 1 Right Leg Compression Amount: 30-40 mmHg Discharge Instruction: Apply Circaid Juxta Lite Compression Wrap daily as instructed. Apply first thing in the morning, remove at night before bed. Add-Ons Electronic Signature(s) Signed: 06/29/2022 6:13:27 PM By: Baruch Gouty RN, BSN Entered By: Baruch Gouty on 06/29/2022 09:20:37 -------------------------------------------------------------------------------- Wound Assessment Details Patient Name: Date of Service: [name], [name] 06/29/2022 8:15 [name] Medical Record Number: [id number] Patient Account Number: [id number] Date of Birth/Sex: Treating RN: [birth year]  (36 y.o. [name] Primary Care Carel Carrier: [name], [name] Other Clinician: Referring Rithwik Schmieg: Treating Elliana Bal/Extender: Graciela Husbands, [name]: 338 Wound Status Wound Number: 56 Primary Etiology: Neuropathic Ulcer-Non Diabetic Wound Location: Left, Dorsal Foot Wound Status: Open Wounding Event: Gradually Appeared Comorbid History: Sleep Apnea, Hypertension, Paraplegia Date Acquired: 07/11/2021 Weeks Of Treatment: 49 Clustered Wound: Yes Photos Wound Measurements Length: (cm) 1.7 Width: (cm) 1.5 Depth: (cm) 0.1 Area: (cm) 2.003 Volume: (cm) 0.2 % Reduction in Area: 67.8% % Reduction in Volume: 67.8% Epithelialization: Large (67-100%) Tunneling: No Undermining: No Wound Description Classification: Full Thickness Without Exposed Support Structures Wound Margin: Flat and Intact Exudate Amount: Small Exudate Type: Serous Exudate Color: amber Foul Odor After Cleansing: No Slough/Fibrino Yes Wound Bed Granulation Amount: Large (67-100%) Exposed Structure Granulation Quality: Red, Pink Fascia Exposed: No Necrotic Amount: Small (1-33%) Fat Layer (Subcutaneous Tissue) Exposed: Yes Necrotic Quality: Adherent Slough Tendon Exposed: No Muscle Exposed: No Joint Exposed: No Bone Exposed: No Treatment Notes Wound #56 (Foot) Wound Laterality: Dorsal, Left Cleanser Soap and Water Discharge Instruction: May shower and wash wound with dial antibacterial soap and water prior to dressing change. Peri-Wound Care Triamcinolone 15 (g) Discharge Instruction: Use triamcinolone 15 (g) as directed Sween Lotion (Moisturizing lotion) Discharge Instruction: Apply Aquaphor moisturizing lotion as directed Lotrisone Discharge Instruction: Apply Lotrisone to periwound Topical Primary Dressing KerraCel Ag Gelling Fiber Dressing, 2x2 in (silver alginate) Discharge Instruction: Apply silver alginate to wound bed as instructed Secondary Dressing Woven  Gauze Sponge, Non-Sterile 4x4 in Discharge Instruction: Apply over primary dressing as directed. Secured With [employer], 4.5x3.1 (in/yd) Discharge Instruction: Secure with Kerlix as directed. 58M Medipore H Soft Cloth Surgical T ape, 4 x 10 (in/yd) Discharge Instruction: Secure with tape as directed. Compression Wrap Compression Stockings Add-Ons Electronic Signature(s) Signed: 06/29/2022 6:13:27 PM By: Baruch Gouty RN, BSN Entered By: Baruch Gouty on 06/29/2022 09:19:05 -------------------------------------------------------------------------------- Wound Assessment Details Patient Name: Date of Service: [name], [name] 06/29/2022 8:15 [name] Medical Record Number: [id number] Patient Account Number: [id number] Date of Birth/Sex: Treating RN: January 19, 1986 (37 y.o. [name] Primary Care Kashon Kraynak: [name], [name] Other Clinician: Referring Giordano Getman: Treating Latavion Halls/Extender: Graciela Husbands, [name]: 338 Wound Status Wound Number: 65 Primary Etiology: Pressure Ulcer Wound Location: Left Upper Leg Wound Status: Open Wounding Event: Pressure Injury Comorbid History: Sleep Apnea, Hypertension, Paraplegia Date Acquired: 05/24/2022 Weeks Of Treatment: 4 Clustered Wound: No Photos Wound Measurements Length: (cm) 6.5 Width: (cm) 2.2 Depth: (cm) 0.1 Area: (cm) 11.231 Volume: (cm) 1.123 % Reduction in Area: -78.8% % Reduction in Volume: -78.8% Epithelialization: Small (1-33%) Tunneling: No Undermining: No Wound Description Classification: Category/Stage III Wound Margin: Flat and Intact Exudate Amount: Large Exudate Type: Purulent Exudate Color: yellow, brown, green Foul Odor After Cleansing: No Slough/Fibrino Yes Wound Bed Granulation Amount: Large (67-100%) Exposed Structure Granulation Quality: Red, Pink, Friable Fascia Exposed:  No Necrotic Amount: Small (1-33%) Fat Layer (Subcutaneous Tissue) Exposed: Yes Necrotic  Quality: Adherent Slough Tendon Exposed: No Muscle Exposed: No Joint Exposed: No Bone Exposed: No Treatment Notes Wound #65 (Upper Leg) Wound Laterality: Left Cleanser Soap and Water Discharge Instruction: May shower and wash wound with dial antibacterial soap and water prior to dressing change. Peri-Wound Care Zinc Oxide Ointment 30g tube Discharge Instruction: Apply Zinc Oxide to periwound with each dressing change Topical Gentamicin Discharge Instruction: As directed by physician [facility] antibiotic compound Discharge Instruction: thin layer to wound bed, use daily once available Primary Dressing KerraCel Ag Gelling Fiber Dressing, 4x5 in (silver alginate) Discharge Instruction: Apply silver alginate to wound bed as instructed Secondary Dressing ALLEVYN Gentle Border, 4x4 (in/in) Discharge Instruction: Apply over primary dressing as directed. Secured With Compression Wrap Compression Stockings Environmental education officer) Signed: 06/29/2022 6:13:27 PM By: Baruch Gouty RN, BSN Entered By: Baruch Gouty on 06/29/2022 09:17:13 -------------------------------------------------------------------------------- Wound Assessment Details Patient Name: Date of Service: Po, [name] 06/29/2022 8:15 [name] Medical Record Number: [id number] Patient Account Number: [id number] Date of Birth/Sex: Treating RN: 1986-08-03 (36 y.o. [name] Primary Care Hasset Chaviano: [name], [name] Other Clinician: Referring Gabby Rackers: Treating Andrzej Scully/Extender: Graciela Husbands, [name]: 338 Wound Status Wound Number: [id number] Primary Etiology: Venous Leg Ulcer Wound Location: Right, Anterior Ankle Wound Status: Open Wounding Event: Pressure Injury Comorbid History: Sleep Apnea, Hypertension, Paraplegia Date Acquired: 06/15/2022 Weeks Of Treatment: 2 Clustered Wound: No Photos Wound Measurements Length: (cm) 1.2 Width: (cm) 1 Depth: (cm) 0.1 Area: (cm)  0.942 Volume: (cm) 0.094 % Reduction in Area: 25.9% % Reduction in Volume: 26% Epithelialization: Small (1-33%) Tunneling: No Undermining: No Wound Description Classification: Full Thickness Without Exposed Support Structures Wound Margin: Flat and Intact Exudate Amount: Medium Exudate Type: Serosanguineous Exudate Color: red, brown Foul Odor After Cleansing: No Slough/Fibrino No Wound Bed Granulation Amount: Large (67-100%) Exposed Structure Granulation Quality: Red, Friable Fascia Exposed: No Necrotic Amount: None Present (0%) Fat Layer (Subcutaneous Tissue) Exposed: Yes Tendon Exposed: No Muscle Exposed: No Joint Exposed: No Bone Exposed: No Treatment Notes Wound #66 (Ankle) Wound Laterality: Right, Anterior Cleanser Soap and Water Discharge Instruction: May shower and wash wound with dial antibacterial soap and water prior to dressing change. Peri-Wound Care Triamcinolone 15 (g) Discharge Instruction: Use triamcinolone 15 (g) as directed Sween Lotion (Moisturizing lotion) Discharge Instruction: Apply Aquaphor moisturizing lotion as directed Lotrisone Discharge Instruction: Apply Lotrisone to periwound Topical Primary Dressing KerraCel Ag Gelling Fiber Dressing, 2x2 in (silver alginate) Discharge Instruction: Apply silver alginate to wound bed as instructed Secondary Dressing Woven Gauze Sponge, Non-Sterile 4x4 in Discharge Instruction: Apply over primary dressing as directed. Secured With [employer], 4.5x3.1 (in/yd) Discharge Instruction: Secure with Kerlix as directed. 41M Medipore H Soft Cloth Surgical T ape, 4 x 10 (in/yd) Discharge Instruction: Secure with tape as directed. Compression Wrap Compression Stockings Add-Ons Electronic Signature(s) Signed: 06/29/2022 6:13:27 PM By: Baruch Gouty RN, BSN Entered By: Baruch Gouty on 06/29/2022 09:19:44 -------------------------------------------------------------------------------- Wound Assessment  Details Patient Name: Date of Service: Grall, [name] 06/29/2022 8:15 [name] Medical Record Number: [id number] Patient Account Number: [id number] Date of Birth/Sex: Treating RN: 1986/10/09 (36 y.o. [name] Primary Care Sabre Romberger: [place], [place] Other Clinician: Referring Natlie Asfour: Treating Kaylah Chiasson/Extender: Graciela Husbands, [name]: 338 Wound Status Wound Number: 67 Primary Etiology: Venous Leg Ulcer Wound Location: Left, Lateral Lower Leg Wound Status: Open Wounding Event: Gradually Appeared Comorbid History: Sleep Apnea, Hypertension, Paraplegia Date Acquired:  06/22/2022 Weeks Of Treatment: 0 Clustered Wound: No Photos Wound Measurements Length: (cm) 5.1 Width: (cm) 1.4 Depth: (cm) 0.1 Area: (cm) 5.608 Volume: (cm) 0.561 % Reduction in Area: -70% % Reduction in Volume: -70% Epithelialization: None Tunneling: No Undermining: No Wound Description Classification: Full Thickness Without Exposed Support Structu Wound Margin: Flat and Intact Exudate Amount: Medium Exudate Type: Serosanguineous Exudate Color: red, brown res Foul Odor After Cleansing: No Slough/Fibrino Yes Wound Bed Granulation Amount: Small (1-33%) Exposed Structure Granulation Quality: Pink Fascia Exposed: No Necrotic Amount: Large (67-100%) Fat Layer (Subcutaneous Tissue) Exposed: Yes Necrotic Quality: Adherent Slough Tendon Exposed: No Muscle Exposed: No Joint Exposed: No Bone Exposed: No Treatment Notes Wound #67 (Lower Leg) Wound Laterality: Left, Lateral Cleanser Soap and Water Discharge Instruction: May shower and wash wound with dial antibacterial soap and water prior to dressing change. Peri-Wound Care Triamcinolone 15 (g) Discharge Instruction: Use triamcinolone 15 (g) as directed Sween Lotion (Moisturizing lotion) Discharge Instruction: Apply Aquaphor moisturizing lotion as directed Lotrisone Discharge Instruction: Apply Lotrisone to  periwound Topical Primary Dressing KerraCel Ag Gelling Fiber Dressing, 2x2 in (silver alginate) Discharge Instruction: Apply silver alginate to wound bed as instructed Secondary Dressing Woven Gauze Sponge, Non-Sterile 4x4 in Discharge Instruction: Apply over primary dressing as directed. Secured With [employer], 4.5x3.1 (in/yd) Discharge Instruction: Secure with Kerlix as directed. 54M Medipore H Soft Cloth Surgical T ape, 4 x 10 (in/yd) Discharge Instruction: Secure with tape as directed. Compression Wrap Compression Stockings Add-Ons Electronic Signature(s) Signed: 06/29/2022 6:13:27 PM By: Baruch Gouty RN, BSN Entered By: Baruch Gouty on 06/29/2022 09:18:26 -------------------------------------------------------------------------------- Vitals Details Patient Name: Date of Service: Sessler, [name] 06/29/2022 8:15 [name] Medical Record Number: [id number] Patient Account Number: [id number] Date of Birth/Sex: Treating RN: 1986-03-02 (36 y.o. [name] Primary Care Izzac Rockett: [place], [place] Other Clinician: Referring Gabriellah Rabel: Treating Beauregard Jarrells/Extender: Graciela Husbands, [name]: 338 Vital Signs Time Taken: 08:51 Temperature (F): 98.2 Height (in): 70 Pulse (bpm): 120 Weight (lbs): 216 Respiratory Rate (breaths/min): 18 Body Mass Index (BMI): 31 Blood Pressure (mmHg): 132/86 Reference Range: 80 - 120 mg / dl Electronic Signature(s) Signed: 06/29/2022 6:13:27 PM By: Baruch Gouty RN, BSN Entered By: Baruch Gouty on 06/29/2022 08:54:19

## 2022-07-13 ENCOUNTER — Encounter (HOSPITAL_BASED_OUTPATIENT_CLINIC_OR_DEPARTMENT_OTHER): Payer: BC Managed Care – PPO | Admitting: General Surgery

## 2022-07-13 DIAGNOSIS — L89323 Pressure ulcer of left buttock, stage 3: Secondary | ICD-10-CM | POA: Diagnosis not present

## 2022-07-13 DIAGNOSIS — L97521 Non-pressure chronic ulcer of other part of left foot limited to breakdown of skin: Secondary | ICD-10-CM | POA: Diagnosis not present

## 2022-07-13 DIAGNOSIS — I87332 Chronic venous hypertension (idiopathic) with ulcer and inflammation of left lower extremity: Secondary | ICD-10-CM | POA: Diagnosis not present

## 2022-07-13 DIAGNOSIS — L97312 Non-pressure chronic ulcer of right 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: Secondary | ICD-10-CM | POA: Diagnosis not present

## 2022-07-13 DIAGNOSIS — G8221 Paraplegia, complete: Secondary | ICD-10-CM | POA: Diagnosis not present

## 2022-07-13 DIAGNOSIS — L97518 Non-pressure chronic ulcer of other part of right foot with other specified severity: Secondary | ICD-10-CM | POA: Diagnosis not present

## 2022-07-13 DIAGNOSIS — L97322 Non-pressure chronic ulcer of left 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: Secondary | ICD-10-CM | POA: Diagnosis not present

## 2022-07-13 DIAGNOSIS — L97511 Non-pressure chronic ulcer of other part of right foot limited to breakdown of skin: Secondary | ICD-10-CM | POA: Diagnosis not present

## 2022-07-13 NOTE — Progress Notes (Signed)
[name], [name] ([id number]) Visit Report for 07/13/2022 Arrival Information Details Patient Name: Date of Service: Mcnally, [name] 07/13/2022 8:15 [name] Medical Record Number: [id number] Patient Account Number: [id number] Date of Birth/Sex: Treating RN: 01/14/1986 (36 y.o. M) Boehlein, Linda Primary Care Provider: [name], [name] Other Clinician: Referring Provider: Treating Provider/Extender: Cannon, Jennifer [name], [name]: 340 Visit Information History Since Last Visit Added or deleted any medications: No Patient Arrived: Wheel Chair Any new allergies or adverse reactions: No Arrival Time: 08:49 Had [name] fall or experienced change in No Accompanied By: self activities of daily living that may affect Transfer Assistance: None risk of falls: Patient Identification Verified: Yes Signs or symptoms of abuse/neglect since last visito No Secondary Verification Process Completed: Yes Hospitalized since last visit: No Patient Requires Transmission-Based Precautions: No Implantable device outside of the clinic excluding No Patient Has Alerts: Yes cellular tissue based products placed in the center Patient Alerts: R ABI = 1.0 since last visit: L ABI = 1.1 Has Dressing in Place as Prescribed: Yes Has Compression in Place as Prescribed: Yes Pain Present Now: No Electronic Signature(s) Signed: 07/13/2022 5:37:55 PM By: Boehlein, Linda RN, BSN Entered By: Boehlein, Linda on 07/13/2022 08:51:02 -------------------------------------------------------------------------------- Lower Extremity Assessment Details Patient Name: Date of Service: [name], [name] 07/13/2022 8:15 [name] Medical Record Number: [id number] Patient Account Number: [id number] Date of Birth/Sex: Treating RN: 02/26/1986 (36 y.o. M) Boehlein, Linda Primary Care Provider: [name], [name] Other Clinician: Referring Provider: Treating Provider/Extender: Cannon, Jennifer [name], [name]: 340 Edema  Assessment Assessed: [Left: No] [Right: No] Edema: [Left: Yes] [Right: Yes] Calf Left: Right: Point of Measurement: 33 cm From Medial Instep 29.5 cm 36 cm Ankle Left: Right: Point of Measurement: 10 cm From Medial Instep 25 cm 25.5 cm Vascular Assessment Pulses: Dorsalis Pedis Palpable: [Left:Yes] [Right:Yes] Electronic Signature(s) Signed: 07/13/2022 5:37:55 PM By: Boehlein, Linda RN, BSN Entered By: Boehlein, Linda on 07/13/2022 08:58:55 -------------------------------------------------------------------------------- Multi Wound Chart Details Patient Name: Date of Service: [name], [name] 07/13/2022 8:15 [name] Medical Record Number: [id number] Patient Account Number: [id number] Date of Birth/Sex: Treating RN: 07/12/1986 (36 y.o. M) Boehlein, Linda Primary Care Provider: [name], [name] Other Clinician: Referring Provider: Treating Provider/Extender: Cannon, Jennifer [name], [name]: 340 Vital Signs Height(in): 70 Pulse(bpm): 132 Weight(lbs): 216 Blood Pressure(mmHg): 122/75 Body Mass Index(BMI): 31 Temperature(°F): 98.1 Respiratory Rate(breaths/min): 18 Photos: Left Ischium Right, Dorsal Foot Left, Dorsal Foot Wound Location: Gradually Appeared Gradually Appeared Gradually Appeared Wounding Event: Pressure Ulcer Trauma, Other Neuropathic Ulcer-Non Diabetic Primary Etiology: Sleep Apnea, Hypertension, Paraplegia Sleep Apnea, Hypertension, Paraplegia Sleep Apnea, Hypertension, Paraplegia Comorbid History: 03/16/2020 03/27/2021 07/11/2021 Date Acquired: 121 67 51 Weeks of Treatment: Open Open Open Wound Status: Yes No No Wound Recurrence: Yes No Yes Clustered Wound: 3 [name] [name] Clustered Quantity: 2x1x0.1 0.7x4.5x0.1 3.6x4.7x0.1 Measurements L x W x D (cm) 1.571 2.474 13.289 [name] (cm) : rea 0.157 0.247 1.329 Volume (cm) : 13.00% -31.20% -113.60% % Reduction in [name] rea: 13.30% -31.40% -113.70% % Reduction in Volume: Category/Stage II Full  Thickness Without Exposed Full Thickness Without Exposed Classification: Support Structures Support Structures Medium Medium Medium Exudate [name] mount: Purulent Serous Serous Exudate Type: yellow, brown, green amber amber Exudate Color: Distinct, outline attached Flat and Intact Flat and Intact Wound Margin: Medium (34-66%) Small (1-33%) Medium (34-66%) Granulation [name] mount: Red, Pink Red Red, Pink Granulation Quality: Medium (34-66%) Large (67-100%) Medium (34-66%) Necrotic [name] mount: Fat Layer (Subcutaneous Tissue): Yes Fat Layer (Subcutaneous Tissue):   Yes Fat Layer (Subcutaneous Tissue): Yes Exposed Structures: Fascia: No Fascia: No Fascia: No Tendon: No Tendon: No Tendon: No Muscle: No Muscle: No Muscle: No Joint: No Joint: No Joint: No Bone: No Bone: No Bone: No Medium (34-66%) Medium (34-66%) Large (67-100%) Epithelialization: Debridement - Selective/Open Wound Debridement - Selective/Open Wound Debridement - Selective/Open Wound Debridement: Pre-procedure Verification/Time Out 09:15 09:15 09:15 Taken: Slough Slough Slough Tissue Debrided: Non-Viable Tissue Non-Viable Tissue Non-Viable Tissue Level: 2 2.1 10.8 Debridement [name] (sq cm): rea Curette Curette Curette Instrument: Minimum Minimum Minimum Bleeding: Pressure Pressure Pressure Hemostasis Achieved: Insensate Insensate Insensate Procedural Pain: Insensate Insensate Insensate Post Procedural Pain: Procedure was tolerated well Procedure was tolerated well Procedure was tolerated well Debridement Treatment Response: 2x1x0.1 0.7x4.5x0.1 3.6x4.7x0.1 Post Debridement Measurements L x W x D (cm) 0.157 0.247 1.329 Post Debridement Volume: (cm) [name] Debridement Debridement Procedures Performed: Wound Number: [id number] Photos: Left Upper Leg Right, Anterior Ankle Left, Lateral Lower Leg Wound Location: Pressure Injury Pressure Injury Gradually Appeared Wounding Event: Pressure Ulcer Venous Leg Ulcer  Venous Leg Ulcer Primary Etiology: Sleep Apnea, Hypertension, Paraplegia Sleep Apnea, Hypertension, Paraplegia Sleep Apnea, Hypertension, Paraplegia Comorbid History: 05/24/2022 06/15/2022 06/22/2022 Date Acquired: 6 4 2 Weeks of Treatment: Open Open Open Wound Status: No No No Wound Recurrence: No No No Clustered Wound: [name] [name] [name] Clustered Quantity: 0.5x0.9x0.1 0.8x0.7x0.1 6x1.4x0.1 Measurements L x W x D (cm) 0.353 0.44 6.597 [name] (cm) : rea 0.035 0.044 0.66 Volume (cm) : 94.40% 65.40% -17.60% % Reduction in [name] rea: 94.40% 65.40% -17.60% % Reduction in Volume: Category/Stage III Full Thickness Without Exposed Full Thickness Without Exposed Classification: Support Structures Support Structures Medium Medium Medium Exudate [name] mount: Purulent Serosanguineous Serosanguineous Exudate Type: yellow, brown, green red, brown red, brown Exudate Color: Flat and Intact Flat and Intact Flat and Intact Wound Margin: Large (67-100%) Large (67-100%) Small (1-33%) Granulation [name] mount: Red, Pink Red, Friable Pink Granulation Quality: Small (1-33%) None Present (0%) Large (67-100%) Necrotic [name] mount: Fat Layer (Subcutaneous Tissue): Yes Fat Layer (Subcutaneous Tissue): Yes Fat Layer (Subcutaneous Tissue): Yes Exposed Structures: Fascia: No Fascia: No Fascia: No Tendon: No Tendon: No Tendon: No Muscle: No Muscle: No Muscle: No Joint: No Joint: No Joint: No Bone: No Bone: No Bone: No Large (67-100%) Small (1-33%) None Epithelialization: [name] [name] Debridement - Selective/Open Wound Debridement: Pre-procedure Verification/Time Out [name] [name] 09:15 Taken: [name] [name] Slough Tissue Debrided: [name] [name] Non-Viable Tissue Level: [name] [name] 5.6 Debridement [name] (sq cm): rea [name] [name] Curette Instrument: [name] [name] Minimum Bleeding: [name] [name] Pressure Hemostasis [name] chieved: [name] [name] Insensate Procedural Pain: [name] [name] Insensate Post Procedural Pain: [name] [name] Procedure was tolerated  well Debridement Treatment Response: [name] [name] 6x1.4x0.1 Post Debridement Measurements L x W x D (cm) [name] [name] 0.66 Post Debridement Volume: (cm) [name] Chemical Cauterization Debridement Procedures Performed: Treatment Notes Electronic Signature(s) Signed: 07/13/2022 9:23:51 AM By: Cannon, Jennifer MD FACS Signed: 07/13/2022 5:37:55 PM By: Boehlein, Linda RN, BSN Entered By: Cannon, Jennifer on 07/13/2022 09:23:51 -------------------------------------------------------------------------------- Multi-Disciplinary Care Plan Details Patient Name: Date of Service: [name], [name] 07/13/2022 8:15 [name] Medical Record Number: [id number] Patient Account Number: [id number] Date of Birth/Sex: Treating RN: 03/12/1986 (35 y.o. M) Boehlein, Linda Primary Care Provider: [name], [name] Other Clinician: Referring Provider: Treating Provider/Extender: Cannon, Jennifer [name], [name]: 340 Multidisciplinary Care Plan reviewed with physician Active Inactive Pressure Nursing Diagnoses: Knowledge deficit related to management of pressures ulcers Potential for impaired tissue integrity related to pressure, friction, moisture, and   shear Goals: Patient will remain free from development of additional pressure ulcers Date Initiated: 06/21/2018 Date Inactivated: 02/12/2019 Target Resolution Date: 02/16/2019 Unmet Reason: pressure ulcer Goal Status: Unmet reopened on left ischium Patient/caregiver will verbalize risk factors for pressure ulcer development Date Initiated: 06/21/2018 Date Inactivated: 05/28/2019 Target Resolution Date: 06/01/2019 Goal Status: Met Patient/caregiver will verbalize understanding of pressure ulcer management Date Initiated: 06/01/2022 Target Resolution Date: 07/27/2022 Goal Status: Active Interventions: Assess: immobility, friction, shearing, incontinence upon admission and as needed Assess offloading mechanisms upon admission and as needed Assess potential for  pressure ulcer upon admission and as needed Treatment Activities: Pressure reduction/relief device ordered : 12/05/2017 Notes: Wound/Skin Impairment Nursing Diagnoses: Impaired tissue integrity Knowledge deficit related to ulceration/compromised skin integrity Goals: Patient/caregiver will verbalize understanding of skin care regimen Date Initiated: 06/01/2022 Target Resolution Date: 07/27/2022 Goal Status: Active Ulcer/skin breakdown will have [name] volume reduction of 30% by week 4 Date Initiated: 06/01/2022 Date Inactivated: 06/29/2022 Target Resolution Date: 06/29/2022 Unmet Reason: complex wounds, Goal Status: Unmet infection Interventions: Assess patient/caregiver ability to obtain necessary supplies Assess ulceration(s) every visit Provide education on ulcer and skin care Notes: 02/02/21: Complex Care, ongoing. Electronic Signature(s) Signed: 07/13/2022 5:37:55 PM By: Boehlein, Linda RN, BSN Entered By: Boehlein, Linda on 07/13/2022 09:14:46 -------------------------------------------------------------------------------- Pain Assessment Details Patient Name: Date of Service: Coppola, [name] 07/13/2022 8:15 [name] Medical Record Number: [id number] Patient Account Number: [id number] Date of Birth/Sex: Treating RN: 07/04/1986 (35 y.o. M) Boehlein, Linda Primary Care Provider: [name], [name] Other Clinician: Referring Provider: Treating Provider/Extender: Cannon, Jennifer [name], [name]: 340 Active Problems Location of Pain Severity and Description of Pain Patient Has Paino No Site Locations Rate the pain. Current Pain Level: 0 Pain Management and Medication Current Pain Management: Electronic Signature(s) Signed: 07/13/2022 5:37:55 PM By: Boehlein, Linda RN, BSN Entered By: Boehlein, Linda on 07/13/2022 08:51:35 -------------------------------------------------------------------------------- Patient/Caregiver Education Details Patient Name: Date of  Service: Davids, [name] 8/22/2023andnbsp8:15 [name] Medical Record Number: [id number] Patient Account Number: [id number] Date of Birth/Gender: Treating RN: 04/01/1986 (35 y.o. M) Boehlein, Linda Primary Care Physician: [name], [name] Other Clinician: Referring Physician: Treating Physician/Extender: Cannon, Jennifer [name], [name]: 340 Education Assessment Education Provided To: Patient Education Topics Provided Pressure: Methods: Explain/Verbal Responses: Reinforcements needed, State content correctly Venous: Methods: Explain/Verbal Responses: Reinforcements needed, State content correctly Wound/Skin Impairment: Methods: Explain/Verbal Responses: Reinforcements needed, State content correctly Electronic Signature(s) Signed: 07/13/2022 5:37:55 PM By: Boehlein, Linda RN, BSN Entered By: Boehlein, Linda on 07/13/2022 09:15:21 -------------------------------------------------------------------------------- Wound Assessment Details Patient Name: Date of Service: [place]es, [name] 07/13/2022 8:15 [name] Medical Record Number: [id number] Patient Account Number: [id number] Date of Birth/Sex: Treating RN: 02/03/1986 (35 y.o. M) Boehlein, Linda Primary Care Provider: [name], [name] Other Clinician: Referring Provider: Treating Provider/Extender: Cannon, Jennifer [name], [name]: 340 Wound Status Wound Number: [id number] Primary Etiology: Pressure Ulcer Wound Location: Left Ischium Wound Status: Open Wounding Event: Gradually Appeared Comorbid History: Sleep Apnea, Hypertension, Paraplegia Date Acquired: 03/16/2020 Weeks Of Treatment: 121 Clustered Wound: Yes Photos Wound Measurements Length: (cm) 2 Width: (cm) 1 Depth: (cm) 0.1 Clustered Quantity: 3 Area: (cm) 1.571 Volume: (cm) 0.157 % Reduction in Area: 13% % Reduction in Volume: 13.3% Epithelialization: Medium (34-66%) Tunneling: No Undermining: No Wound Description Classification:  Category/Stage II Wound Margin: Distinct, outline attached Exudate Amount: Medium Exudate Type: Purulent Exudate Color: yellow, brown, green Foul Odor After Cleansing: No Slough/Fibrino Yes Wound Bed Granulation Amount: Medium (34-66%) Exposed Structure Granulation Quality: Red, Pink   Fascia Exposed: No Necrotic Amount: Medium (34-66%) Fat Layer (Subcutaneous Tissue) Exposed: Yes Necrotic Quality: Adherent Slough Tendon Exposed: No Muscle Exposed: No Joint Exposed: No Bone Exposed: No Electronic Signature(s) Signed: 07/13/2022 5:37:55 PM By: Baruch Gouty RN, BSN Entered By: Baruch Gouty on 07/13/2022 09:12:25 -------------------------------------------------------------------------------- Wound Assessment Details Patient Name: Date of Service: Stagner, [name] 07/13/2022 8:15 [name] Medical Record Number: [id number] Patient Account Number: [id number] Date of Birth/Sex: Treating RN: 11-05-1986 (36 y.o. [name] Primary Care Dorothia Passmore: [place], [place] Other Clinician: Referring Miloh Alcocer: Treating Clare Fennimore/Extender: Graciela Husbands, [name]: 340 Wound Status Wound Number: [id number] Primary Etiology: Trauma, Other Wound Location: Right, Dorsal Foot Wound Status: Open Wounding Event: Gradually Appeared Comorbid History: Sleep Apnea, Hypertension, Paraplegia Date Acquired: 03/27/2021 Weeks Of Treatment: 67 Clustered Wound: No Photos Wound Measurements Length: (cm) 0.7 Width: (cm) 4.5 Depth: (cm) 0.1 Area: (cm) 2.474 Volume: (cm) 0.247 % Reduction in Area: -31.2% % Reduction in Volume: -31.4% Epithelialization: Medium (34-66%) Tunneling: No Undermining: No Wound Description Classification: Full Thickness Without Exposed Support Structures Wound Margin: Flat and Intact Exudate Amount: Medium Exudate Type: Serous Exudate Color: amber Foul Odor After Cleansing: No Slough/Fibrino No Wound Bed Granulation Amount: Small (1-33%) Exposed  Structure Granulation Quality: Red Fascia Exposed: No Necrotic Amount: Large (67-100%) Fat Layer (Subcutaneous Tissue) Exposed: Yes Necrotic Quality: Adherent Slough Tendon Exposed: No Muscle Exposed: No Joint Exposed: No Bone Exposed: No Electronic Signature(s) Signed: 07/13/2022 5:37:55 PM By: Baruch Gouty RN, BSN Signed: 07/13/2022 5:37:55 PM By: Baruch Gouty RN, BSN Entered By: Baruch Gouty on 07/13/2022 09:05:25 -------------------------------------------------------------------------------- Wound Assessment Details Patient Name: Date of Service: Mcnulty, [name] 07/13/2022 8:15 [name] Medical Record Number: [id number] Patient Account Number: [id number] Date of Birth/Sex: Treating RN: 1986-09-01 (36 y.o. [name] Primary Care Zilda No: [place], [place] Other Clinician: Referring Bexlee Bergdoll: Treating Perrin Eddleman/Extender: Graciela Husbands, [name]: 340 Wound Status Wound Number: [id number] Primary Etiology: Neuropathic Ulcer-Non Diabetic Wound Location: Left, Dorsal Foot Wound Status: Open Wounding Event: Gradually Appeared Comorbid History: Sleep Apnea, Hypertension, Paraplegia Date Acquired: 07/11/2021 Weeks Of Treatment: 51 Clustered Wound: Yes Photos Wound Measurements Length: (cm) 3.6 Width: (cm) 4.7 Depth: (cm) 0.1 Area: (cm) 13.289 Volume: (cm) 1.329 % Reduction in Area: -113.6% % Reduction in Volume: -113.7% Epithelialization: Large (67-100%) Tunneling: No Undermining: No Wound Description Classification: Full Thickness Without Exposed Support Structures Wound Margin: Flat and Intact Exudate Amount: Medium Exudate Type: Serous Exudate Color: amber Foul Odor After Cleansing: No Slough/Fibrino Yes Wound Bed Granulation Amount: Medium (34-66%) Exposed Structure Granulation Quality: Red, Pink Fascia Exposed: No Necrotic Amount: Medium (34-66%) Fat Layer (Subcutaneous Tissue) Exposed: Yes Necrotic Quality: Adherent  Slough Tendon Exposed: No Muscle Exposed: No Joint Exposed: No Bone Exposed: No Electronic Signature(s) Signed: 07/13/2022 5:37:55 PM By: Baruch Gouty RN, BSN Entered By: Baruch Gouty on 07/13/2022 09:06:23 -------------------------------------------------------------------------------- Wound Assessment Details Patient Name: Date of Service: [place], [name] 07/13/2022 8:15 [name] Medical Record Number: [id number] Patient Account Number: [id number] Date of Birth/Sex: Treating RN: 11/21/1986 (36 y.o. [name] Primary Care Osbaldo Mark: [place], [place] Other Clinician: Referring Elijahjames Fuelling: Treating Zyair Rhein/Extender: Graciela Husbands, [name]: 340 Wound Status Wound Number: [id number] Primary Etiology: Pressure Ulcer Wound Location: Left Upper Leg Wound Status: Open Wounding Event: Pressure Injury Comorbid History: Sleep Apnea, Hypertension, Paraplegia Date Acquired: 05/24/2022 Weeks Of Treatment: 6 Clustered Wound: No Photos Wound Measurements Length: (cm) 0.5 Width: (cm) 0.9 Depth: (cm) 0.1 Area: (cm) 0.353 Volume: (cm) 0.035 %  Reduction in Area: 94.4% % Reduction in Volume: 94.4% Epithelialization: Large (67-100%) Tunneling: No Undermining: No Wound Description Classification: Category/Stage III Wound Margin: Flat and Intact Exudate Amount: Medium Exudate Type: Purulent Exudate Color: yellow, brown, green Foul Odor After Cleansing: No Slough/Fibrino No Wound Bed Granulation Amount: Large (67-100%) Exposed Structure Granulation Quality: Red, Pink Fascia Exposed: No Necrotic Amount: Small (1-33%) Fat Layer (Subcutaneous Tissue) Exposed: Yes Necrotic Quality: Adherent Slough Tendon Exposed: No Muscle Exposed: No Joint Exposed: No Bone Exposed: No Electronic Signature(s) Signed: 07/13/2022 5:37:55 PM By: Boehlein, Linda RN, BSN Entered By: Boehlein, Linda on 07/13/2022  09:13:41 -------------------------------------------------------------------------------- Wound Assessment Details Patient Name: Date of Service: Downie, [name] 07/13/2022 8:15 [name] Medical Record Number: [id number] Patient Account Number: [id number] Date of Birth/Sex: Treating RN: 05/16/1986 (35 y.o. M) Boehlein, Linda Primary Care Francesca Strome: [name], [name] Other Clinician: Referring Michae Grimley: Treating Chloee Tena/Extender: Cannon, Jennifer [name], [name]: 340 Wound Status Wound Number: [id number] Primary Etiology: Venous Leg Ulcer Wound Location: Right, Anterior Ankle Wound Status: Open Wounding Event: Pressure Injury Comorbid History: Sleep Apnea, Hypertension, Paraplegia Date Acquired: 06/15/2022 Weeks Of Treatment: 4 Clustered Wound: No Photos Wound Measurements Length: (cm) 0.8 Width: (cm) 0.7 Depth: (cm) 0.1 Area: (cm) 0.44 Volume: (cm) 0.044 % Reduction in Area: 65.4% % Reduction in Volume: 65.4% Epithelialization: Small (1-33%) Tunneling: No Undermining: No Wound Description Classification: Full Thickness Without Exposed Support Structures Wound Margin: Flat and Intact Exudate Amount: Medium Exudate Type: Serosanguineous Exudate Color: red, brown Foul Odor After Cleansing: No Slough/Fibrino No Wound Bed Granulation Amount: Large (67-100%) Exposed Structure Granulation Quality: Red, Friable Fascia Exposed: No Necrotic Amount: None Present (0%) Fat Layer (Subcutaneous Tissue) Exposed: Yes Tendon Exposed: No Muscle Exposed: No Joint Exposed: No Bone Exposed: No Electronic Signature(s) Signed: 07/13/2022 5:37:55 PM By: Boehlein, Linda RN, BSN Entered By: Boehlein, Linda on 07/13/2022 09:07:00 -------------------------------------------------------------------------------- Wound Assessment Details Patient Name: Date of Service: Antrobus, [name] 07/13/2022 8:15 [name] Medical Record Number: [id number] Patient Account Number: [id number] Date of  Birth/Sex: Treating RN: 09/17/1986 (35 y.o. M) Boehlein, Linda Primary Care Jisell Majer: [name], [name] Other Clinician: Referring Angel Weedon: Treating Jeremey Bascom/Extender: Cannon, Jennifer [name], [name]: 340 Wound Status Wound Number: 67 Primary Etiology: Venous Leg Ulcer Wound Location: Left, Lateral Lower Leg Wound Status: Open Wounding Event: Gradually Appeared Comorbid History: Sleep Apnea, Hypertension, Paraplegia Date Acquired: 06/22/2022 Weeks Of Treatment: 2 Clustered Wound: No Photos Wound Measurements Length: (cm) 6 Width: (cm) 1.4 Depth: (cm) 0.1 Area: (cm) 6.597 Volume: (cm) 0.66 % Reduction in Area: -17.6% % Reduction in Volume: -17.6% Epithelialization: None Tunneling: No Undermining: No Wound Description Classification: Full Thickness Without Exposed Support Structures Wound Margin: Flat and Intact Exudate Amount: Medium Exudate Type: Serosanguineous Exudate Color: red, brown Foul Odor After Cleansing: No Slough/Fibrino Yes Wound Bed Granulation Amount: Small (1-33%) Exposed Structure Granulation Quality: Pink Fascia Exposed: No Necrotic Amount: Large (67-100%) Fat Layer (Subcutaneous Tissue) Exposed: Yes Necrotic Quality: Adherent Slough Tendon Exposed: No Muscle Exposed: No Joint Exposed: No Bone Exposed: No Electronic Signature(s) Signed: 07/13/2022 5:37:55 PM By: Boehlein, Linda RN, BSN Entered By: Boehlein, Linda on 07/13/2022 09:07:39 -------------------------------------------------------------------------------- Vitals Details Patient Name: Date of Service: Velie, [name] 07/13/2022 8:15 [name] Medical Record Number: [id number] Patient Account Number: [id number] Date of Birth/Sex: Treating RN: 08/16/1986 (35 y.o. M) Boehlein, Linda Primary Care Daneil Beem: [name], [name] Other Clinician: Referring Ryver Zadrozny: Treating Tiona Ruane/Extender: Cannon, Jennifer [name], [name]: 340 Vital Signs Time Taken:  08:51 Temperature (°F): 98.1   Height (in): 70 Pulse (bpm): 132 Weight (lbs): 216 Respiratory Rate (breaths/min): 18 Body Mass Index (BMI): 31 Blood Pressure (mmHg): 122/75 Reference Range: 80 - 120 mg / dl Electronic Signature(s) Signed: 07/13/2022 5:37:55 PM By: Boehlein, Linda RN, BSN Entered By: Boehlein, Linda on 07/13/2022 08:51:27 

## 2022-07-13 NOTE — Progress Notes (Signed)
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 venous inflammation. The wound on the left leg is somewhat better. I am back to the point where I like to try an Apligraf once again 02/10/17; slight improvement in wound dimensions. Apligraf #2. He is completing his doxycycline 02/14/17; patient arrives today having completed doxycycline last Thursday. This was supposed to be [name] nurse visit however once again he hasn't tense erythema from the medial part of his wound extending over the lower leg. Also erythema in his foot this is roughly in the same distribution as last time. He has baseline chronic venous inflammation however this is [name] lot worse than the baseline I have learned to accept the on him is baseline inflammation 02/24/17- patient is here for follow-up evaluation. He is tolerating compression therapy. His voicing no complaints or concerns he is here anticipating an Apligraf 03/03/17; he arrives today with an adherent necrotic surface. I don't think this is surface is going to be amenable for Apligraf's. The erythema around his wound and on the left dorsal foot has resolved he is off antibiotics 03/10/17; better-looking surface today. I don't think he can tolerate Apligraf's. He tells me he had [name] wound VAC after [name] skin graft years ago to this area and they had difficulty with [name] seal. The  erythema continues to be stable around this some degree of chronic venous inflammation but he also has recurrent cellulitis. We have been using Iodoflex 03/17/17; continued improvement in the surface and may be small changes in dimensions. Using Iodoflex which seems the only thing that will control his surface 03/24/17- He is here for follow up evaluation of his LLE lateral ulceration and ulcer to right dorsal foot/toe space. He is voicing no complaints or concerns, He is tolerating compression wrap. 03/31/17 arrives today with [name] much healthier looking wound on the left lower extremity. We have been using Iodoflex for [name] prolonged period of time which has for the first time prepared and adequate looking wound bed although we have not had much in the way of wound dimension improvement. He also has [name] small wound between the first and second toe on the right 04/07/17; arrives today with [name] healthy-looking wound bed and at least the top 50% of this wound appears to be now her. No debridement was required I have changed him to [facility] last week after prolonged Iodoflex. He did not do well  with Apligraf's. We've had [name] re-opening between the first and second toe on the right 04/14/17; arrives today with [name] healthier looking wound bed contractions and the top 50% of this wound and some on the lesser 50%. Wound bed appears healthy. The area between the first and second toe on the right still remains problematic 04/21/17; continued very gradual improvement. Using [facility] 04/28/17; continued very gradual improvement in the left lateral leg venous insufficiency wound. His periwound erythema is very mild. We have been using Hydrofera Blue. Wound is making progress especially in the superior 50% 05/05/17; he continues to have very gradual improvement in the left lateral venous insufficiency wound. Both in terms with an length rings are improving. I debrided this every 2 weeks with #5 curet and we have been  using Hydrofera Blue and again making good progress With regards to the wounds between his right first and second toe which I thought might of been tinea pedis he is not making as much progress very dry scaly skin over the area. Also the area at the base of the left first and second toe in [name] similar condition 05/12/17; continued gradual improvement in the refractory left lateral venous insufficiency wound on the left. Dimension smaller. Surface still requiring debridement using Hydrofera Blue 05/19/17; continued gradual improvement in the refractory left lateral venous ulceration. Careful inspection of the wound bed underlying rumination suggested some degree of epithelialization over the surface no debridement indicated. Continue Hydrofera Blue difficult areas between his toes first and third on the left than first and second on the right. I'm going to change to silver alginate from silver collagen. Continue ketoconazole as I suspect underlying tinea pedis 05/26/17; left lateral leg venous insufficiency wound. We've been using Hydrofera Blue. I believe that there is expanding epithelialization over the surface of the wound albeit not coming from the wound circumference. This is [name] bit of an odd situation in which the epithelialization seems to be coming from the surface of the wound rather than in the exact circumference. There is still small open areas mostly along the lateral margin of the wound. He has unchanged areas between the left first and second and the right first second toes which I been treating for tenia pedis 06/02/17; left lateral leg venous insufficiency wound. We have been using Hydrofera Blue. Somewhat smaller from the wound circumference. The surface of the wound remains [name] bit on it almost epithelialized sedation in appearance. I use an open curette today debridement in the surface of all of this especially the edges Small open wounds remaining on the dorsal right first and second toe  interspace and the plantar left first second toe and her face on the left 06/09/17; wound on the left lateral leg continues to be smaller but very gradual and very dry surface using Hydrofera Blue 06/16/17 requires weekly debridements now on the left lateral leg although this continues to contract. I changed to silver collagen last week because of dryness of the wound bed. Using Iodoflex to the areas on his first and second toes/web space bilaterally 06/24/17; patient with history of paraplegia also chronic venous insufficiency with lymphedema. Has [name] very difficult wound on the left lateral leg. This has been gradually reducing in terms of with but comes in with [name] very dry adherent surface. High switch to silver collagen [name] week or so ago with hydrogel to keep the area moist. This is been refractory to multiple dressing attempts. He also has areas in his first and second  toes bilaterally in the anterior and posterior web space. I had been using Iodoflex here after [name] prolonged course of silver alginate with ketoconazole was ineffective [question tinea pedis] 07/14/17; patient arrives today with [name] very difficult adherent material over his left lateral lower leg wound. He also has surrounding erythema and poorly controlled edema. He was switched his Santyl last visit which the nurses are applying once during his doctor visit and once on [name] nurse visit. He was also reduced to 2 layer compression I'm not exactly sure of the issue here. 07/21/17; better surface today after 1 week of Iodoflex. Significant cellulitis that we treated last week also better. [Doxycycline] 07/28/17 better surface today with now 2 weeks of Iodoflex. Significant cellulitis treated with doxycycline. He has now completed the doxycycline and he is back to his usual degree of chronic venous inflammation/stasis dermatitis. He reminds me he has had ablations surgery here 08/04/17; continued improvement with Iodoflex to the left lateral leg wound in  terms of the surface of the wound although the dimensions are better. 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 Problematic areas on the plantar aspect of the first second toe web space on the left and the dorsal aspect of the first second toe web space on the right. At one point I felt these were probably related to chronic fungal infections in treated him aggressively for this although we have not made any improvement here. 08/11/17; left lateral leg. Surface continues to improve with the Iodoflex although we are not seeing much improvement in overall wound dimensions. Areas on his plantar left foot and right foot show no improvement. In fact the right foot looks somewhat worse 08/18/17; left lateral leg. We changed to [facility] Blue last week after [name] prolonged course of Iodoflex which helps get the surface better. It appears that the wound with is improved. Continue with difficult areas on the left dorsal first second and plantar first second on the right 09/01/17; patient arrives in clinic today having had [name] temperature of 103 yesterday. He was seen in the ER and [facility]. The patient was concerned he could have cellulitis again in the right leg however they diagnosed him with [name] UTI and he is now on Keflex. He has [name] history of cellulitis which is been recurrent and difficult but this is been in the left leg, in the past 5 use doxycycline. He does in and out catheterizations at home which are risk factors for UTI 09/08/17; patient will be completing his Keflex this weekend. The erythema on the left leg is considerably better. He has [name] new wound today on the medial part of the right leg small superficial almost looks like [name] skin tear. He has worsening of the area on the right dorsal first and second toe. His major area on the left lateral leg is better. Using Hydrofera Blue on all areas 09/15/17; gradual reduction in width on the  long wound in the left lateral leg. No debridement required. He also has wounds on the plantar aspect of his left first second toe web space and on the dorsal aspect of the right first second toe web space. 09/22/17; there continues to be very gradual improvements in the dimensions of the left lateral leg wound. He hasn't round erythematous spot with might be pressure on his wheelchair. There is no evidence obviously of infection no purulence no warmth He has [name] dry scaled area on the plantar aspect of  the left first second toe Improved area on the dorsal right first second toe. 09/29/17; left lateral leg wound continues to improve in dimensions mostly with an is still [name] fairly long but increasingly narrow wound. He has [name] dry scaled area on the plantar aspect of his left first second toe web space Increasingly concerning area on the dorsal right first second toe. In fact I am concerned today about possible cellulitis around this wound. The areas extending up his second toe and although there is deformities here almost appears to abut on the nailbed. 10/06/17; left lateral leg wound continues to make very gradual progress. Tissue culture I did from the right first second toe dorsal foot last time grew MRSA and enterococcus which was vancomycin sensitive. This was not sensitive to clindamycin or doxycycline. He is allergic to Zyvox and sulfa we have therefore arrange for him to have dalvance infusion tomorrow. He is had this in the past and tolerated it well 10/20/17; left lateral leg wound continues to make decent progress. This is certainly reduced in terms of with there is advancing epithelialization.The cellulitis in the right foot looks better although he still has [name] deep wound in the dorsal aspect of the first second toe web space. Plantar left first toe web space on the left I think is making some progress 10/27/17; left lateral leg wound continues to make decent progress. Advancing  epithelialization.using Hydrofera Blue The right first second toe web space wound is better-looking using silver alginate Improvement in the left plantar first second toe web space. Again using silver alginate 11/03/17 left lateral leg wound continues to make decent progress albeit slowly. Using [facility] The right per second toe web space continues to be [name] very problematic looking punched out wound. I obtained [name] piece of tissue for deep culture I did extensively treated this for fungus. It is difficult to imagine that this is [name] pressure area as the patient states other than going outside he doesn't really wear shoes at home The left plantar first second toe web space looked fairly senescent. Necrotic edges. This required debridement change to [facility] Blue to all wound areas 11/10/17; left lateral leg wound continues to contract. Using Hydrofera Blue On the right dorsal first second toe web space dorsally. Culture I did of this area last week grew MRSA there is not an easy oral option in this patient was multiple antibiotic allergies or intolerances. This was only [name] rare culture isolate I'm therefore going to use Bactroban under silver alginate On the left plantar first second toe web space. Debridement is required here. This is also unchanged 11/17/17; left lateral leg wound continues to contract using Hydrofera Blue this is no longer the major issue. The major concern here is the right first second toe web space. He now has an open area going from dorsally to the plantar aspect. There is now wound on the inner lateral part of the first toe. Not [name] very viable surface on this. There is erythema spreading medially into the forefoot. No major change in the left first second toe plantar wound 11/24/17; left lateral leg wound continues to contract using Hydrofera Blue. Nice improvement today The right first second toe web space all of this looks [name] lot less angry than last week. I have given him  clindamycin and topical Bactroban for MRSA and terbinafine for the possibility of underlining tinea pedis that I could not control with ketoconazole. Looks somewhat better The area on the plantar left first second  toe web space is weeping with dried debris around the wound 12/01/17; left lateral leg wound continues to contract he Hydrofera Blue. It is becoming thinner in terms of with nevertheless it is making good improvement. The right first second toe web space looks less angry but still [name] large necrotic-looking wounds starting on the plantar aspect of the right foot extending between the toes and now extensively on the base of the right second toe. I gave him clindamycin and topical Bactroban for MRSA anterior benefiting for the possibility of underlying tinea pedis. Not looking better today The area on the left first/second toe looks better. Debrided of necrotic debris 12/05/17* the patient was worked in urgently today because over the weekend he found blood on his incontinence bad when he woke up. He was found to have an ulcer by his wife who does most of his wound care. He came in today for [place] to look at this. He has not had [name] history of wounds in his buttocks in spite of his paraplegia. 12/08/17; seen in follow-up today at his usual appointment. He was seen earlier this week and found to have [name] new wound on his buttock. We also follow him for wounds on the left lateral leg, left first second toe web space and right first second toe web space 12/15/17; we have been using Hydrofera Blue to the left lateral leg which has improved. The right first second toe web space has also improved. Left first second toe web space plantar aspect looks stable. The left buttock has worsened using Santyl. Apparently the buttock has drainage 12/22/17; we have been using Hydrofera Blue to the left lateral leg which continues to improve now 2 small wounds separated by normal skin. He tells [place] he had [name] fever up to 100  yesterday he is prone to UTIs but has not noted anything different. He does in and out catheterizations. The area between the first and second toes today does not look good necrotic surface covered with what looks to be purulent drainage and erythema extending into the third toe. I had gotten this to something that I thought look better last time however it is not look good today. He also has [name] necrotic surface over the buttock wound which is expanded. I thought there might be infection under here so I removed [name] lot of the surface with [name] #5 curet though nothing look like it really needed culturing. He is been using Santyl to this area 12/27/17; his original wound on the left lateral leg continues to improve using Hydrofera Blue. I gave him samples of Baxdella although he was unable to take them out of fear for an allergic reaction ["lump in his throat"].the culture I did of the purulent drainage from his second toe last week showed both enterococcus and [name] set Enterobacter I was also concerned about the erythema on the bottom of his foot although paradoxically although this looks somewhat better today. 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 We've been using silver alginate here. They came up today that he sleeps in [name] recliner, I'm not sure why but I asked him to stop this 01/03/18; his original wound we've been using Hydrofera Blue is now separated into 2 areas. Ulcer on his left buttock is better he is off the recliner and sleeping in bed Finally both wound areas between his first and second toes also looks some better 01/10/18;  his original wound on the left lateral leg is now separated into 2 wounds we've been using Hydrofera Blue Ulcer on his left buttock has some drainage. There is [name] small probing site going into muscle layer superiorly.using silver alginate -He arrives today with [name] deep tissue injury on  the left heel The wound on the dorsal aspect of his first second toe on the left looks [name] lot betterusing silver alginate ketoconazole The area on the first second toe web space on the right also looks [name] lot bette 01/17/18; his original wound on the left lateral leg continues to progress using Hydrofera Blue Ulcer on his left buttock also is smaller surface healthier except for [name] small probing site going into the muscle layer superiorly. 2.4 cm of tunneling in this area DTI on his left heel we have only been offloading. Looks better than last week no threatened open no evidence of infection the wound on the dorsal aspect of the first second toe on the left continues to look like it's regressing we have only been using silver alginate and terbinafine orally The area in the first second toe web space on the right also looks to be [name] lot better using silver alginate and terbinafine I think this was prompted by tinea pedis 01/31/18; the patient was hospitalized in [place] last week apparently for [name] complicated UTI. He was discharged on cefepime he does in and out catheterizations. In the hospital he was discovered M I don't mild elevation of AST and ALT and the terbinafine was stopped.predictably the pressure ulcer on s his buttock looks betterusing silver alginate. The area on the left lateral leg also is better using Hydrofera Blue. The area between the first and second toes on the left better. First and second toes on the right still substantial but better. Finally the DTI on the left heel has held together and looks like it's resolving 02/07/18-he is here in follow-up evaluation for multiple ulcerations. He has new injury to the lateral aspect of the last issue [name] pressure ulcer, he states this is from adhesive removal trauma. He states he has tried multiple adhesive products with no success. All other ulcers appear stable. The left heel DTI is resolving. We will continue with same treatment plan and  follow-up next week. 02/14/18; follow-up for multiple areas. He has [name] new area last week on the lateral aspect of his pressure ulcer more over the posterior trochanter. The original pressure ulcer looks quite stable has healthy granulation. 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 Almost fully epithelialized on the original superior area using Hydrofera Blue DTI on the left heel has peeled off this week to reveal [name] small superficial wound under denuded skin and subcutaneous tissue Both areas between the first and second toes look better including nothing open on the left 02/21/18; The patient's wounds on his left ischial tuberosity and posterior left greater trochanter actually looked better. He has [name] large area of irritation around the area which I think is contact dermatitis. I am doubtful that this is fungal His original wound on the left lateral calf continues to improve we have been using Hydrofera Blue There is no open area in the left first second toe web space although there is [name] lot of thick callus The DTI on the left heel required debridement today of necrotic surface eschar and subcutaneous tissue using silver alginate Finally the area on  the right first second toe webspace continues to contract using silver alginate and ketoconazole 02/28/18 Left ischial tuberosity wounds look better using silver alginate. Original wound on the left calf only has one small open area left using Hydrofera Blue DTI on the left heel required debridement mostly removing skin from around this wound surface. Using silver alginate The areas on the right first/second toe web space using silver alginate and ketoconazole 03/08/18 on evaluation today patient appears to be doing decently well as best I can tell in regard to his wounds. This is the first time that I have seen him as he generally is followed by Dr.  Dellia Nims. With that being said none of his wounds appear to be infected he does have an area where there is some skin covering what appears to be [name] new wound on the left dorsal surface of his great toe. This is right at the nail bed. With that being said I do believe that debrided away some of the excess skin can be of benefit in this regard. Otherwise he has been tolerating the dressing changes without complication. 03/14/18; patient arrives today with the multiplicity of wounds that we are following. He has not been systemically unwell Original wound on the left lateral calf now only has 2 small open areas we've been using Hydrofera Blue which should continue The deep tissue injury on the left heel requires debridement today. We've been using silver alginate The left first second toe and the right first second toe are both are reminiscence what I think was tinea pedis. Apparently some of the callus Surface between the toes was removed last week when it started draining. Purulent drainage coming from the wound on the ischial tuberosity on the left. 03/21/18-He is here in follow-up evaluation for multiple wounds. 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 He tolerated debridement. The only change to last week's recommendations is to discontinue antifungal cream between toes. He will follow-up next week 03/28/18; following up for multiple wounds;Concern this week is streaking redness and swelling in the right foot. He is going to need antibiotics for this. 03/31/18; follow-up for right foot cellulitis. Streaking redness and swelling in the right foot on 03/28/18. 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 I put him on clindamycin 300 mg every 6 and brought him in for [name] quick check. He has an open wound between his first and second toes on the right foot as [name] potential source. 04/04/18; Right foot cellulitis is  resolving he is completing clindamycin. This is truly good news Left lateral calf wound which is initial wound only has one small open area inferiorly this is close to healing out. He has compression stockings. We will use Hydrofera Blue right down to the epithelialization of this Nonviable surface on the left heel which was initially pressure with [name] DTI. We've been using Hydrofera Blue. I'm going to switch this back to silver alginate Left first second toe/tinea pedis this looks better using silver alginate Right first second toe tinea pedis using silver alginate Large pressure ulcers on theLeft ischial tuberosity. Small wound here Looks better. I am uncertain about the surface over the large wound. Using silver alginate 04/11/18; Cellulitis in the right foot is resolved Left lateral calf wound which was his original wounds still has 2 tiny open areas remaining this is just about closed Nonviable surface on the left heel is better but still requires debridement Left first  second toe/tinea pedis still open using silver alginate Right first second toe wound tinea pedis I asked him to go back to using ketoconazole and silver alginate Large pressure ulcers on the left ischial tuberosity this shear injury here is resolved. Wound is smaller. No evidence of infection using silver alginate 04/18/18; Patient arrives with an intense area of cellulitis in the right mid lower calf extending into the right heel area. Bright red and warm. Smaller area on the left anterior leg. He has [name] significant history of MRSA. He will definitely need antibioticsdoxycycline He now has 2 open areas on the left ischial tuberosity the original large wound and now [name] satellite area which I think was above his initial satellite areas. Not [name] wonderful surface on this satellite area surrounding erythema which looks like pressure related. His left lateral calf wound again his original wound is just about closed Left heel pressure  injury still requiring debridement Left first second toe looks [name] lot better using silver alginate Right first second toe also using silver alginate and ketoconazole cream also looks better 04/20/18; the patient was worked in early today out of concerns with his cellulitis on the right leg. I had started him on doxycycline. This was 2 days ago. His wife was concerned about the swelling in the area. Also concerned about the left buttock. He has not been systemically unwell no fever chills. No nausea vomiting or diarrhea 04/25/18; the patient's left buttock wound is continued to deteriorate he is using Hydrofera Blue. He is still completing clindamycin for the cellulitis on the right leg although all of this looks better. 05/02/18 Left buttock wound still with [name] lot of drainage and [name] very tightly adherent fibrinous necrotic surface. He has [name] deeper area superiorly The left lateral calf wound is still closed DTI wound on the left heel necrotic surface especially the circumference using Iodoflex Areas between his left first second toe and right first second toe both look better. Dorsally and the right first second toe he had [name] necrotic surface although at smaller. In using silver alginate and ketoconazole. I did [name] culture last week which was [name] deep tissue culture of the reminiscence of the open wound on the right first second toe dorsally. This grew [name] few Acinetobacter and [name] few methicillin-resistant staph aureus. Nevertheless the area actually this week looked better. I didn't feel the need to specifically address this at least in terms of systemic antibiotics. 05/09/18; wounds are measuring larger more drainage per our intake. We are using Santyl covered with alginate on the large superficial buttock wounds, Iodosorb on the left heel, ketoconazole and silver alginate to the dorsal first and second toes bilaterally. 05/16/18; The area on his left buttock better in some aspects although the area superiorly  over the ischial tuberosity required an extensive debridement.using Santyl Left heel appears stable. Using Iodoflex The areas between his first and second toes are not bad however there is spreading erythema up the dorsal aspect of his left foot this looks like cellulitis again. He is insensate the erythema is really very brilliant.o Erysipelas He went to see an allergist days ago because he was itching part of this he had lab work done. This showed [name] white count of 15.1 with 70% neutrophils. Hemoglobin of 11.4 and [name] platelet count of 659,000. Last white count we had in Epic was [name] 2-1/2 years ago which was 25.9 but he was ill at the time. He was able to show me some lab work that  was done by his primary physician the pattern is about the same. 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 But prompted me to go ahead and do x-rays of both feet and the pelvis rule out osteomyelitis. He also had [name] comprehensive metabolic panel this was reasonably normal his albumin was 3.7 liver function tests BUN/creatinine all normal 05/23/18; x-rays of both his feet from last week were negative for underlying pulmonary abnormality. The x-ray of his pelvis however showed mild irregularity in the left ischial which may represent some early osteomyelitis. The wound in the left ischial continues to get deeper clearly now exposed muscle. Each week necrotic surface material over this area. Whereas the rest of the wounds do not look so bad. The left ischial wound we have been using Santyl and calcium alginate T the left heel surface necrotic debris using Iodoflex o The left lateral leg is still healed Areas on the left dorsal foot and the right dorsal foot are about the same. There is some inflammation on the left which might represent contact dermatitis, fungal dermatitis I am doubtful cellulitis although this looks better than last week 05/30/18; CT scan done at Hospital did not  show any osteomyelitis or abscess. Suggested the possibility of underlying cellulitis although I don't see [name] lot of evidence of this at the bedside The wound itself on the left buttock/upper thigh actually looks somewhat better. No debridement Left heel also looks better no debridement continue Iodoflex Both dorsal first second toe spaces appear better using Lotrisone. Left still required debridement 06/06/18; Intake reported some purulent looking drainage from the left gluteal wound. Using Santyl and calcium alginate Left heel looks better although still [name] nonviable surface requiring debridement The left dorsal foot first/second webspace actually expanding and somewhat deeper. I may consider doing [name] shave biopsy of this area Right dorsal foot first/second webspace appears stable to improved. Using Lotrisone and silver alginate to both these areas 06/13/18 Left gluteal surface looks better. Now separated in the 2 wounds. No debridement required. Still drainage. We'll continue silver alginate Left heel continues to look better with Iodoflex continue this for at least another week Of his dorsal foot wounds the area on the left still has some depth although it looks better than last week. We've been using Lotrisone and silver alginate 06/20/18 Left gluteal continues to look better healthy tissue Left heel continues to look better healthy granulation wound is smaller. He is using Iodoflex and his long as this continues continue the Iodoflex Dorsal right foot looks better unfortunately dorsal left foot does not. There is swelling and erythema of his forefoot. He had minor trauma to this several days ago but doesn't think this was enough to have caused any tissue injury. Foot looks like cellulitis, we have had this problem before 06/27/18 on evaluation today patient appears to be doing [name] little worse in regard to his foot ulcer. 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 Both of which would really be his only options for treating this infection. In the past he has been given and effusion of Orbactiv. This is done very well for him in the past again it's one time dosing IV antibiotic therapy. Subsequently I do believe this is something we're gonna need to see about doing at this point in time. Currently his  other wounds seem to be doing somewhat better in my pinion I'm pretty happy in that regard. 07/03/18 on evaluation today patient's wounds actually appear to be doing fairly well. He has been tolerating the dressing changes without complication. All in all he seems to be showing signs of improvement. In regard to the antibiotics he has been dealing with infectious disease since I saw him last week as far as getting this scheduled. In the end [facility]'s [facility] this coming Friday. In the meantime he has been continuing to perform the dressing changes in such as previous. There does not appear to be any evidence of infection worsengin at this time. 07/10/18; Since I last saw this man 2 weeks ago things have actually improved. IV antibiotics of resulted in less forefoot erythema although there is still some present. He is not systemically unwell Left buttock wounds 2 now have no depth there is increased epithelialization Using silver alginate Left heel still requires debridement using Iodoflex Left dorsal foot still with [name] sizable wound about the size of [name] border but healthy granulation Right dorsal foot still with [name] slitlike area using silver alginate 07/18/18; the patient's cellulitis in the left foot is improved in fact I think it is on its way to resolving. Left buttock wounds 2 both look better although the larger one has hypertension granulation we've been using silver alginate Left heel has some thick  circumferential redundant skin over the wound edge which will need to be removed today we've been using Iodoflex Left dorsal foot is still [name] sizable wound required debridement using silver alginate The right dorsal foot is just about closed only [name] small open area remains here 07/25/18; left foot cellulitis is resolved Left buttock wounds 2 both look better. Hyper-granulation on the major area Left heel as some debris over the surface but otherwise looks [name] healthier wound. Using silver collagen Right dorsal foot is just about closed 07/31/18; arrives with our intake nurse worried about purulent drainage from the buttock. We had hyper-granulation here last week His buttock wounds 2 continue to look better Left heel some debris over the surface but measuring smaller. Right dorsal foot unfortunately has openings between the toes Left foot superficial wound looks less aggravated. 08/07/18 Buttock wounds continue to look better although some of her granulation and the larger medial wound. silver alginate Left heel continues to look [name] lot better.silver collagen Left foot superficial wound looks less stable. Requires debridement. He has [name] new wound superficial area on the foot on the lateral dorsal foot. Right foot looks better using silver alginate without Lotrisone 08/14/2018; patient was in the ER last week diagnosed with [name] UTI. He is now on Cefpodoxime and Macrodantin. Buttock wounds continued to be smaller. Using silver alginate Left heel continues to look better using silver collagen Left foot superficial wound looks as though it is improving Right dorsal foot area is just about healed. 08/21/2018; patient is completed his antibiotics for his UTI. He has 2 open areas on the buttocks. There is still not closed although the surface looks satisfactory. Using silver alginate Left heel continues to improve using silver collagen The bilateral dorsal foot areas which are at the base of his first and second  toes/possible tinea pedis are actually stable on the left but worse on the right. The area on the left required debridement of necrotic surface. After debridement I obtained [name] specimen for PCR culture. The right  dorsal foot which is been just about healed last week is now reopened 08/28/2018; culture done on the left dorsal foot showed coag negative staph both staph epidermidis and Lugdunensis. I think this is worthwhile initiating systemic treatment. I will use doxycycline given his long list of allergies. The area on the left heel slightly improved but still requiring debridement. The large wound on the buttock is just about closed whereas the smaller one is larger. Using silver alginate in this area 09/04/2018; patient is completing his doxycycline for the left foot although this continues to be [name] very difficult wound area with very adherent necrotic debris. We are using silver alginate to all his wounds right foot left foot and the small wounds on his buttock, silver collagen on the left heel. 09/11/2018; once again this patient has intense erythema and swelling of the left forefoot. Lesser degrees of erythema in the right foot. He has [name] long list of allergies and intolerances. I will reinstitute doxycycline. 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 Using silver alginate Left heel also looks better using silver collagen Unfortunately both the areas on his feet look worse. The area on the left first second webspace is now gone through to the plantar part of his foot. The area on the left foot anteriorly is irritated with erythema and swelling in the forefoot. 09/25/2018 His wound on the left plantar heel looks better. Using silver collagen The area on the left buttock 2 small remnant areas. One is closed one is still open. Using silver alginate The areas between both his first and second toes look worse. This in spite of long-standing antifungal  therapy with ketoconazole and silver alginate which should have antifungal activity He has small areas around his original wound on the left calf one is on the bottom of the original scar tissue and one superiorly both of these are small and superficial but again given wound history in this site this is worrisome 10/02/2018 Left plantar heel continues to gradually contract using silver collagen Left buttock wound is unchanged using silver alginate The areas on his dorsal feet between his first and second toes bilaterally look about the same. I prescribed clindamycin ointment to see if we can address chronic staph colonization and also the underlying possibility of erythrasma The left lateral lower extremity wound is actually on the lateral part of his ankle. Small open area here. We have been using silver alginate 10/09/2018; Left plantar heel continues to look healthy and contract. No debridement is required Left buttock slightly smaller with [name] tape injury wound just below which was new this week Dorsal feet somewhat improved I have been using clindamycin Left lateral looks lower extremity the actual open area looks worse although [name] lot of this is epithelialized. I am going to change to silver collagen today He has [name] lot more swelling in the right leg although this is not pitting not red and not particularly warm there is [name] lot of spasm in the right leg usually indicative of people with paralysis of some underlying discomfort. We have reviewed his vascular status from 2017 he had [name] left greater saphenous vein ablation. I wonder about referring him back to vascular surgery if the area on the left leg continues to deteriorate. 10/16/2018 in today for follow-up and management of multiple lower extremity ulcers. His left Buttock wound is much lower smaller and almost closed completely. The wound to the left ankle has  began to reopen with Epithelialization and some adherent slough. He has multiple  new areas to the left foot and leg. The left dorsal foot without much improvement. Wound present between left great webspace and 2nd toe. Erythema and edema present right leg. Right LE ultrasound obtained on 10/10/18 was negative for DVT . 10/23/2018; Left buttock is closed over. Still dry macerated skin but there is no open wound. I suspect this is chronic pressure/moisture Left lateral calf is quite [name] bit worse than when I saw this last. There is clearly drainage here he has macerated skin into the left plantar heel. We will change the primary dressing to alginate Left dorsal foot has some improvement in overall wound area. Still using clindamycin and silver alginate Right dorsal foot about the same as the left using clindamycin and silver alginate The erythema in the right leg has resolved. He is DVT rule out was negative Left heel pressure area required debridement although the wound is smaller and the surface is health 10/26/2018 The patient came back in for his nurse check today predominantly because of the drainage coming out of the left lateral leg with [name] recent reopening of his original wound on the left lateral calf. He comes in today with [name] large amount of surrounding erythema around the wound extending from the calf into the ankle and even in the area on the dorsal foot. He is not systemically unwell. He is not febrile. Nevertheless this looks like cellulitis. We have been using silver alginate to the area. I changed him to [name] regular visit and I am going to prescribe him doxycycline. The rationale here is [name] long list of medication intolerances and [name] history of MRSA. I did not see anything that I thought would provide [name] valuable culture 10/30/2018 Follow-up from his appointment 4 days ago with really an extensive area of cellulitis in the left calf left lateral ankle and left dorsal foot. I put him on doxycycline. He has [name] long list of medication allergies which are true allergy reactions.  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 In any case he is [name] lot better today. The erythema especially in the anterior and lateral left calf is better. He still has left ankle erythema. He also is complaining about increasing edema in the right leg we have only been using Kerlix Coban and he has been doing the wraps at home. Finally he has [name] spotty rash on the medial part of his upper left calf which looks like folliculitis or perhaps wrap occlusion type injury. Small superficial macules not pustules 11/06/18 patient arrives today with again [name] considerable degree of erythema around the wound on the left lateral calf extending into the dorsal ankle and dorsal foot. This is [name] lot worse than when I saw this last week. He is on doxycycline really with not [name] lot of improvement. He has not been systemically unwell Wounds on the; left heel actually looks improved. Original area on the left foot and proximity to the first and second toes looks about the same. He has superficial areas on the dorsal foot, anterior calf and then the reopening of his original wound on the left lateral calf which looks about the same The only area he has on the right is the dorsal webspace first and second which is smaller. He has [name] large area of dry erythematous skin on the left buttock small open area here. 11/13/2018; the patient  arrives in much better condition. The erythema around the wound on the left lateral calf is [name] lot better. Not sure whether this was the clindamycin or the TCA and ketoconazole or just in the improvement in edema control [stasis dermatitis]. In any case this is [name] lot better. The area on the left heel is very small and just about resolved using silver collagen we have been using silver alginate to the areas on his dorsal feet 11/20/2018; his wounds include the left lateral calf, left heel, dorsal aspects of both feet just proximal to  the first second webspace. He is stable to slightly improved. I did not think any changes to his dressings were going to be necessary 11/27/2018 he has [name] reopening on the left buttock which is surrounded by what looks like tinea or perhaps some other form of dermatitis. The area on the left dorsal foot has some erythema around it I have marked this area but I am not sure whether this is cellulitis or not. Left heel is not closed. Left calf the reopening is really slightly longer and probably worse 1/13; in general things look better and smaller except for the left dorsal foot. Area on the left heel is just about closed, left buttock looks better only [name] small wound remains in the skin looks better [using Lotrisone] 1/20; the area on the left heel only has [name] few remaining open areas here. Left lateral calf about the same in terms of size, left dorsal foot slightly larger right lateral foot still not closed. The area on the left buttock has no open wound and the surrounding skin looks [name] lot better 1/27; the area on the left heel is closed. Left lateral calf better but still requiring extensive debridements. The area on his left buttock is closed. He still has the open areas on the left dorsal foot which is slightly smaller in the right foot which is slightly expanded. We have been using Iodoflex on these areas as well 2/3; left heel is closed. Left lateral calf still requiring debridement using Iodoflex there is no open area on his left buttock however he has dry scaly skin over [name] large area of this. Not really responding well to the Lotrisone. Finally the areas on his dorsal feet at the level of the first second webspace are slightly smaller on the right and about the same on the left. Both of these vigorously debrided with Anasept and gauze 2/10; left heel remains closed he has dry erythematous skin over the left buttock but there is no open wound here. Left lateral leg has come in and with.  Still requiring debridement we have been using Iodoflex here. Finally the area on the left dorsal foot and right dorsal foot are really about the same extremely dry callused fissured areas. He does not yet have [name] dermatology appointment 2/17; left heel remains closed. He has [name] new open area on the left buttock. The area on the left lateral calf is bigger longer and still covered in necrotic debris. No major change in his foot areas bilaterally. I am awaiting for [name] dermatologist to look on this. We have been using ketoconazole I do not know that this is been doing any good at all. 2/24; left heel remains closed. The left buttock wound that was new reopening last week looks better. The left lateral calf appears better also although still requires debridement. The major area on his foot is the left first second also requiring debridement. We have been  putting Prisma on all wounds. I do not believe that the ketoconazole has done too much good for his feet. He will use Lotrisone I am going to give him [name] 2-week course of terbinafine. We still do not have [name] dermatology appointment 3/2 left heel remains closed however there is skin over bone in this area I pointed this out to him today. The left buttock wound is epithelialized but still does not look completely stable. The area on the left leg required debridement were using silver collagen here. With regards to his feet we changed to Lotrisone last week and silver alginate. 3/9; left heel remains closed. Left buttock remains closed. The area on the right foot is essentially closed. The left foot remains unchanged. Slightly smaller on the left lateral calf. Using silver collagen to both of these areas 3/16-Left heel remains closed. Area on right foot is closed. Left lateral calf above the lateral malleolus open wound requiring debridement with easy bleeding. Left dorsal wound proximal to first toe also debrided. Left ischial area open new. Patient has been  using Prisma with wrapping every 3 days. Dermatology appointment is apparently tomorrow.Patient has completed his terbinafine 2-week course with some apparent improvement according to him, there is still flaking and dry skin in his foot on the left 3/23; area on the right foot is reopened. The area on the left anterior foot is about the same still [name] very necrotic adherent surface. He still has the area on the left leg and reopening is on the left buttock. He apparently saw dermatology although I do not have [name] note. According to the patient who is usually fairly well informed they did not have any good ideas. Put him on oral terbinafine which she is been on before. 3/30; using silver collagen to all wounds. Apparently his dermatologist put him on doxycycline and rifampin presumably some culture grew staph. I do not have this result. He remains on terbinafine although I have used terbinafine on him before 4/6; patient has had [name] fairly substantial reopening on the right foot between the first and second toes. He is finished his terbinafine and I believe is on doxycycline and rifampin still as prescribed by dermatology. We have been using silver collagen to all his wounds although the patient reports that he thinks silver alginate does better on the wounds on his buttock. 4/13; the area on his left lateral calf about the same size but it did not require debridement. Left dorsal foot just proximal to the webspace between the first and second toes is about the same. Still nonviable surface. I note some superficial bronze discoloration of the dorsal part of his foot Right dorsal foot just proximal to the first and second toes also looks about the same. I still think there may be the same discoloration I noted above on the left Left buttock wound looks about the same 4/20; left lateral calf appears to be gradually contracting using silver collagen. He remains on erythromycin empiric treatment for possible  erythrasma involving his digital spaces. The left dorsal foot wound is debrided of tightly adherent necrotic debris and really cleans up quite nicely. The right area is worse with expansion. I did not debride this it is now over the base of the second toe The area on his left buttock is smaller no debridement is required using silver collagen 5/4; left calf continues to make good progress. He arrives with erythema around the wounds on his dorsal foot which even extends to the plantar  aspect. Very concerning for coexistent infection. He is finished the erythromycin I gave him for possible erythrasma this does not seem to have helped. The area on the left foot is about the same base of the dorsal toes Is area on the buttock looks improved on the left 5/11; left calf and left buttock continued to make good progress. Left foot is about the same to slightly improved. Major problem is on the right foot. He has not had an x-ray. Deep tissue culture I did last week showed both Enterobacter and E. coli. I did not change the doxycycline I put him on empirically although neither 1 of these were plated to doxycycline. He arrives today with the erythema looking worse on both the dorsal and plantar foot. Macerated skin on the bottom of the foot. he has not been systemically unwell 5/18-Patient returns at 1 week, left calf wound appears to be making some progress, left buttock wound appears slightly worse than last time, left foot wound looks slightly better, right foot redness is marginally better. X-ray of both feet show no air or evidence of osteomyelitis. Patient is finished his Omnicef and terbinafine. He continues to have macerated skin on the bottom of the left foot as well as right 5/26; left calf wound is better, left buttock wound appears to have multiple small superficial open areas with surrounding macerated skin. X-rays that I did last time showed no evidence of osteomyelitis in either foot. He is  finished cefdinir and doxycycline. I do not think that he was on terbinafine. He continues to have [name] large superficial open area on the right foot anterior dorsal and slightly between the first and second toes. I did send him to dermatology 2 months ago or so wondering about whether they would do [name] fungal scraping. I do not believe they did but did do [name] culture. We have been using silver alginate to the toe areas, he has been using antifungals at home topically either ketoconazole or Lotrisone. We are using silver collagen on the left foot, silver alginate on the right, silver collagen on the left lateral leg and silver alginate on the left buttock 6/1; left buttock area is healed. We have the left dorsal foot, left lateral leg and right dorsal foot. We are using silver alginate to the areas on both feet and silver collagen to the area on his left lateral calf 6/8; the left buttock apparently reopened late last week. He is not really sure how this happened. He is tolerating the terbinafine. Using silver alginate to all wounds 6/15; left buttock wound is larger than last week but still superficial. Came in the clinic today with [name] report of purulence from the left lateral leg I did not identify any infection Both areas on his dorsal feet appear to be better. He is tolerating the terbinafine. Using silver alginate to all wounds 6/22; left buttock is about the same this week, left calf quite [name] bit better. His left foot is about the same however he comes in with erythema and warmth in the right forefoot once again. Culture that I gave him in the beginning of May showed Enterobacter and E. coli. I gave him doxycycline and things seem to improve although neither 1 of these organisms was specifically plated. 6/29; left buttock is larger and dry this week. Left lateral calf looks to me to be improved. Left dorsal foot also somewhat improved right foot completely unchanged. The erythema on the right foot is  still present. He  is completing the Ceftin dinner that I gave him empirically [see discussion above.) 7/6 - All wounds look to be stable and perhaps improved, the left buttock wound is slightly smaller, per patient bleeds easily, completed ceftin, the right foot redness is less, he is on terbinafine 7/13; left buttock wound about the same perhaps slightly narrower. Area on the left lateral leg continues to narrow. Left dorsal foot slightly smaller right foot about the same. We are using silver alginate on the right foot and Hydrofera Blue to the areas on the left. Unna boot on the left 2 layer compression on the right 7/20; left buttock wound absolutely the same. Area on lateral leg continues to get better. Left dorsal foot require debridement as did the right no major change in the 7/27; left buttock wound the same size necrotic debris over the surface. The area on the lateral leg is closed once again. His left foot looks better right foot about the same although there is some involvement now of the posterior first second toe area. He is still on terbinafine which I have given him for [name] month, not certain [name] centimeter major change 06/25/19-All wounds appear to be slightly improved according to report, left buttock wound looks clean, both foot wounds have minimal to no debris the right dorsal foot has minimal slough. We are using Hydrofera Blue to the left and silver alginate to the right foot and ischial wound. 8/10-Wounds all appear to be around the same, the right forefoot distal part has some redness which was not there before, however the wound looks clean and small. Ischial wound looks about the same with no changes 8/17; his wound on the left lateral calf which was his original chronic venous insufficiency wound remains closed. Since I last saw him the areas on the left dorsal foot right dorsal foot generally appear better but require debridement. The area on his left initial tuberosity appears  somewhat larger to me perhaps hyper granulated and bleeds very easily. We have been using Hydrofera Blue to the left dorsal foot and silver alginate to everything else 8/24; left lateral calf remains closed. The areas on his dorsal feet on the webspace of the first and second toes bilaterally both look better. The area on the left buttock which is the pressure ulcer stage II slightly smaller. I change the dressing to Hydrofera Blue to all areas 8/31; left lateral calf remains closed. The area on his dorsal feet bilaterally look better. Using Hydrofera Blue. Still requiring debridement on the left foot. No change in the left buttock pressure ulcers however 9/14; left lateral calf remains closed. Dorsal feet look quite [name] bit better than 2 weeks ago. Flaking dry skin also [name] lot better with the ammonium lactate I gave him 2 weeks ago. The area on the left buttock is improved. He states that his Roho cushion developed [name] leak and he is getting [name] new one, in the interim he is offloading this vigorously 9/21; left calf remains closed. Left heel which was [name] possible DTI looks better this week. He had macerated tissue around the left dorsal foot right foot looks satisfactory and improved left buttock wound. I changed his dressings to his feet to silver alginate bilaterally. Continuing Hydrofera Blue on the left buttock. 9/28 left calf remains closed. Left heel did not develop anything [possible DTI] dry flaking skin on the left dorsal foot. Right foot looks satisfactory. Improved left buttock wound. We are using silver alginate on his feet Hydrofera Blue  on the buttock. I have asked him to go back to the Lotrisone on his feet including the wounds and surrounding areas 10/5; left calf remains closed. The areas on the left and right feet about the same. [name] lot of this is epithelialized however debris over the remaining open areas. He is using Lotrisone and silver alginate. The area on the left buttock using  Hydrofera Blue 10/26. Patient has been out for 3 weeks secondary to Covid concerns. He tested negative but I think his wife tested positive. He comes in today with the left foot substantially worse, right foot about the same. Even more concerning he states that the area on his left buttock closed over but then reopened and is considerably deeper in one aspect than it was before [stage III wound] 11/2; left foot really about the same as last week. Quarter sized wound on the dorsal foot just proximal to the first second toes. Surrounding erythema with areas of denuded epithelium. This is not really much different looking. Did not look like cellulitis this time however. Right foot area about the same.. We have been using silver alginate alginate on his toes Left buttock still substantial irritated skin around the wound which I think looks somewhat better. We have been using Hydrofera Blue here. 11/9; left foot larger than last week and [name] very necrotic surface. Right foot I think is about the same perhaps slightly smaller. Debris around the circumference also addressed. Unfortunately on the left buttock there is been [name] decline. Satellite lesions below the major wound distally and now [name] an additional one posteriorly we have been using Hydrofera Blue but I think this is [name] pressure issue 11/16; left foot ulcer dorsally again [name] very adherent necrotic surface. Right foot is about the same. Not much change in the pressure ulcer on his left buttock. 11/30; left foot ulcer dorsally basically the same as when I saw him 2 weeks ago. Very adherent fibrinous debris on the wound surface. Patient reports [name] lot of drainage as well. 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 We have been using Iodoflex, patient changed back to alginate because of the drainage. Area on his right dorsal foot really looks benign with [name] healthier surface certainly [name] lot better than on the left.  Left buttock wounds all improved using Hydrofera Blue 12/7; left dorsal foot again no improvement. Tightly adherent debris. PCR culture I did last week only showed likely skin contaminant. I have gone ahead and done [name] punch biopsy of this which is about the last thing in terms of investigations I can think to do. He has known venous insufficiency and venous hypertension and this could be the issue here. The area on the right foot is about the same left buttock slightly worse according to our intake nurse secondary to [facility] Blue sticking to the wound 12/14; biopsy of the left foot that I did last time showed changes that could be related to wound healing/chronic stasis dermatitis phenomenon no neoplasm. We have been using silver alginate to both feet. I change the one on the left today to Sorbact and silver alginate to his other 2 wounds 12/28; the patient arrives with the following problems; Major issue is the dorsal left foot which continues to be [name] larger deeper wound area. Still with [name] completely nonviable surface Paradoxically the area mirror image on the right on the right dorsal foot appears to be getting better. He had some loss of dry  denuded skin from the lower part of his original wound on the left lateral calf. Some of this area looked [name] little vulnerable and for this reason we put him in wrap that on this side this week The area on his left buttock is larger. He still has the erythematous circular area which I think is [name] combination of pressure, sweat. This does not look like cellulitis or fungal dermatitis 11/26/2019; -Dorsal left foot large open wound with depth. Still debris over the surface. Using Sorbact The area on the dorsal right foot paradoxically has closed over He has [name] reopening on the left ankle laterally at the base of his original wound that extended up into the calf. This appears clean. The left buttock wound is smaller but with very adherent necrotic debris over the  surface. We have been using silver alginate here as well The patient had arterial studies done in 2017. He had biphasic waveforms at the dorsalis pedis and posterior tibial bilaterally. ABI in the left was 1.17. Digit waveforms were dampened. He has slight spasticity in the great toes I do not think [name] TBI would be possible 1/11; the patient comes in today with [name] sizable reopening between the first and second toes on the right. This is not exactly in the same location where we have been treating wounds previously. According to our intake nurse this was actually fairly deep but 0.6 cm. The area on the left dorsal foot looks about the same the surface is somewhat cleaner using Sorbact, his MRI is in 2 days. We have not managed yet to get arterial studies. The new reopening on the left lateral calf looks somewhat better using alginate. The left buttock wound is about the same using alginate 1/18; the patient had his ARTERIAL studies which were quite normal. ABI in the right at 1.13 with triphasic/biphasic waveforms on the left ABI 1.06 again with triphasic/biphasic waveforms. It would not have been possible to have done [name] toe brachial index because of spasticity. We have been using Sorbac to the left foot alginate to the rest of his wounds on the right foot left lateral calf and left buttock 1/25; arrives in clinic with erythema and swelling of the left forefoot worse over the first MTP area. This extends laterally dorsally and but also posteriorly. Still has an area on the left lateral part of the lower part of his calf wound it is eschared and clearly not closed. Area on the left buttock still with surrounding irritation and erythema. Right foot surface wound dorsally. The area between the right and first and second toes appears better. 2/1; The left foot wound is about the same. Erythema slightly better I gave him [name] week of doxycycline empirically Right foot wound is more extensive extending between  the toes to the plantar surface Left lateral calf really no open surface on the inferior part of his original wound however the entire area still looks vulnerable Absolutely no improvement in the left buttock wound required debridement. 2/8; the left foot is about the same. Erythema is slightly improved I gave him clindamycin last week. Right foot looks better he is using Lotrimin and silver alginate He has [name] breakdown in the left lateral calf. Denuded epithelium which I have removed Left buttock about the same were using Hydrofera Blue 2/15; left foot is about the same there is less surrounding erythema. Surface still has tightly adherent debris which I have debriding however not making any progress Right foot has [name] substantial wound  on the medial right second toe between the first and second webspace. Still an open area on the left lateral calf distal area. Buttock wound is about the same 2/22; left foot is about the same less surrounding erythema. Surface has adherent debris. Polymen Ag Right foot area significant wound between the first and second toes. We have been using silver alginate here Left lateral leg polymen Ag at the base of his original venous insufficiency wound Left buttock some improvement here 3/1; Right foot is deteriorating in the first second toe webspace. Larger and more substantial. We have been using silver alginate. Left dorsal foot about the same markedly adherent surface debris using PolyMem Ag Left lateral calf surface debris using PolyMem AG Left buttock is improved again using PolyMem Ag. He is completing his terbinafine. The erythema in the foot seems better. He has been on this for 2 weeks 3/8; no improvement in any wound area in fact he has [name] small open area on the dorsal midfoot which is new this week. He has not gotten his foot x-rays yet 3/15; his x-rays were both negative for osteomyelitis of both feet. No major change in any of his wounds on the  extremities however his buttock wounds are better. We have been using polymen on the buttocks, left lower leg. Iodoflex on the left foot and silver alginate on the right 3/22; arrives in clinic today with the 2 major issues are the improvement in the left dorsal foot wound which for once actually looks healthy with [name] nice healthy wound surface without debridement. Using Iodoflex here. Unfortunately on the left lateral calf which is in the distal part of his original wound he came to the clinic here for there was purulent drainage noted some increased breakdown scattered around the original area and [name] small area proximally. We we are using polymen here will change to silver alginate today. His buttock wound on the left is better and I think the area on the right first second toe webspace is also improved 3/29; left dorsal foot looks better. Using Iodoflex. Left ankle culture from deterioration last time grew E. coli, Enterobacter and Enterococcus. I will give him [name] course of cefdinir although that will not cover Enterococcus. The area on the right foot in the webspace of the first and second toe lateral first toe looks better. The area on his buttock is about healed Vascular appointment is on April 21. This is to look at his venous system vis--vis continued breakdown of the wounds on the left including the left lateral leg and left dorsal foot he. He has had previous ablations on this side 4/5; the area between the right first and second toes lateral aspect of the first toe looks better. Dorsal aspect of the left first toe on the left foot also improved. Unfortunately the left lateral lower leg is larger and there is [name] second satellite wound superiorly. The usual superficial abrasions on the left buttock overall better but certainly not closed 4/12; the area between the right first and second toes is improved. Dorsal aspect of the left foot also slightly smaller with [name] vibrant healthy looking  surface. No real change in the left lateral leg and the left buttock wound is healed He has an unaffordable co-pay for Apligraf. Appointment with vein and vascular with regards to the left leg venous part of the circulation is on 4/21 4/19; we continue to see improvement in all wound areas. Although this is minor. He has his vascular appointment  on 4/21. The area on the left buttock has not reopened although right in the center of this area the skin looks somewhat threatened 4/26; the left buttock is unfortunately reopened. In general his left dorsal foot has [name] healthy surface and looks somewhat smaller although it was not measured as such. The area between his first and second toe webspace on the right as [name] small wound against the first toe. The patient saw vascular surgery. The real question I was asking was about the small saphenous vein on the left. He has previously ablated left greater saphenous vein. Nothing further was commented on on the left. Right greater saphenous vein without reflux at the saphenofemoral junction or proximal thigh there was no indication for ablation of the right greater saphenous vein duplex was negative for DVT bilaterally. They did not think there was anything from [name] vascular surgery point of view that could be offered. They ABIs within normal limits 5/3; only small open area on the left buttock. The area on the left lateral leg which was his original venous reflux is now 2 wounds both which look clean. We are using Iodoflex on the left dorsal foot which looks healthy and smaller. He is down to [name] very tiny area between the right first and second toes, using silver alginate 5/10; all of his wounds appear better. We have much better edema control in 4 layer compression on the left. This may be the factor that is allowing the left foot and left lateral calf to heal. He has external compression garments at home 04/14/20-All of his wounds are progressing well, the left  forefoot is practically closed, left ischium appears to be about the same, right toe webspace is also smaller. The left lateral leg is about the same, continue using Hydrofera Blue to this, silver alginate to the ischium, Iodoflex to the toe space on the right 6/7; most of his wounds outside of the left buttock are doing well. The area on the left lateral calf and left dorsal foot are smaller. The area on the right foot in between the first and second toe webspace is barely visible although he still says there is some drainage here is the only reason I did not heal this out. Unfortunately the area on the left buttock almost looks like he has [name] skin tear from tape. He has open wound and then [name] large flap of skin that we are trying to get adherence over an area just next to the remaining wound 6/21; 2 week follow-up. I believe is been here for nurse visits. Miraculously the area between his first and second toes on the left dorsal foot is closed over. Still open on the right first second web space. The left lateral calf has 2 open areas. Distally this is more superficial. The proximal area had [name] little more depth and required debridement of adherent necrotic material. His buttock wound is actually larger we have been using silver alginate here 6/28; the patient's area on the left foot remains closed. Still open wet area between the first and second toes on the right and also extending into the plantar aspect. We have been using silver alginate in this location. He has 2 areas on the left lower leg part of his original long wounds which I think are better. We have been using Hydrofera Blue here. Hydrofera Blue to the left buttock which is stable 7/12; left foot remains closed. Left ankle is closed. May be [name] small area between his right  first and second toes the only truly open area is on the left buttock. We have been using Hydrofera Blue here 7/19; patient arrives with marked deterioration especially in  the left foot and ankle. We did not put him in [name] compression wrap on the left last week in fact he wore his juxta lite stockings on either side although he does not have an underlying stocking. He has [name] reopening on the left dorsal foot, left lateral ankle and [name] new area on the right dorsal ankle. More worrisome is the degree of erythema on the left foot extending on the lateral foot into the lateral lower leg on the left 7/26; the patient had erythema and drainage from the lateral left ankle last week. Culture of this grew MRSA resistant to doxycycline and clindamycin which are the 2 antibiotics we usually use with this patient who has multiple antibiotic allergies including linezolid, trimethoprim sulfamethoxazole. I had give him an empiric doxycycline and he comes in the area certainly looks somewhat better although it is blotchy in his lower leg. He has not been systemically unwell. He has had areas on the left dorsal foot which is [name] reopening, chronic wounds on the left lateral ankle. Both of these I think are secondary to chronic venous insufficiency. The area between his first and second toes is closed as far as I can tell. He had [name] new wrap injury on the right dorsal ankle last week. Finally he has an area on the left buttock. We have been using silver alginate to everything except the left buttock we are using Hydrofera Blue 06/30/20-Patient returns at 1 week, has been given [name] sample dose pack of NUZYRA which is [name] tetracycline derivative [omadacycline], patient has completed those, we have been using silver alginate to almost all the wounds except the left ischium where we are using Hydrofera Blue all of them look better 8/16; since I last saw the patient he has been doing well. The area on the left buttock, left lateral ankle and left foot are all closed today. He has completed the [place] I gave him last time and tolerated this well. He still has open areas on the right dorsal ankle and in the  right first second toe area which we are using silver alginate. 8/23; we put him in his bilateral external compression stockings last week as he did not have anything open on either leg except for concerning area between the right first and second toe. He comes in today with an area on the left dorsal foot slightly more proximal than the original wound, the left lateral foot but this is actually [name] continuation of the area he had on the left lateral ankle from last time. As well he is opened up on the left buttock again. 8/30; comes in today with things looking [name] lot better. The area on the left lower ankle has closed down as has the left foot but with eschar in both areas. The area on the dorsal right ankle is also epithelialized. Very little remaining of the left buttock wound. We have been using silver alginate on all wound areas 9/13; the area in the first second toe webspace on the right has fully epithelialized. He still has some vulnerable epithelium on the right and the ankle and the dorsal foot. He notes weeping. He is using his juxta lite stocking. On the left again the left dorsal foot is closed left lateral ankle is closed. We went to the juxta lite stocking here  as well. Still vulnerable in the left buttock although only 2 small open areas remain here 9/27; 2-week follow-up. We did not look at his left leg but the patient says everything is closed. He is [name] bit disturbed by the amount of edema in his left foot he is using juxta lite stockings but asking about over the toes stockings which would be 30/40, will talk to him next time. According to him there is no open wound on either the left foot or the left ankle/calf He has an open area on the dorsal right calf which I initially point [name] wrap injury. He has superficial remaining wound on the left ischial tuberosity been using silver alginate although he says this sticks to the wound 10/5; we gave him 2-week follow-up but he called yesterday  expressing some concerns about his right foot right ankle and the left buttock. He came in early. There is still no open areas on the left leg and that still in his juxta lite stocking 10/11; he only has 1 small area on the left buttock that remains measuring millimeters 1 mm. Still has the same irritated skin in this area. We recommended zinc oxide when this eventually closes and pressure relief is meticulously is he can do this. He still has an area on the dorsal part of his right first through third toes which is [name] bit irritated and still open and on the dorsal ankle near the crease of the ankle. We have been using silver alginate and using his own stocking. He has nothing open on the left leg or foot 10/25; 2-week follow-up. Not nearly as good on the left buttock as I was hoping. For open areas with 5 looking threatened small. He has the erythematous irritated chronic skin in this area. 1 area on the right dorsal ankle. He reports this area bleeds easily Right dorsal foot just proximal to the base of his toes We have been using silver alginate. 11/8; 2-week follow-up. Left buttock is about the same although I do not think the wounds are in the same location we have been using silver alginate. I have asked him to use zinc oxide on the skin around the wounds. He still has [name] small area on the right dorsal ankle he reports this bleeds easily Right dorsal foot just proximal to the base of the toes does not have anything open although the skin is very dry and scaly He has [name] new opening on the nailbed of the left great toe. Nothing on the left ankle 11/29; 3-week follow-up. Left buttock has 2 open areas. And washing of these wounds today started bleeding easily. Suggesting very friable tissue. We have been using silver alginate. Right dorsal ankle which I thought was initially [name] wrap injury we have been using silver alginate. Nothing open between the toes that I can see. He states the area on the left  dorsal toe nailbed healed after the last visit in 2 or 3 days 12/13; 3-week follow-up. His left buttock now has 3 open areas but the original 2 areas are smaller using polymen here. Surrounding skin looks better. The right dorsal ankle is closed. He has [name] small opening on the right dorsal foot at the level of the third toe. In general the skin looks better here. He is wearing his juxta lite stocking on the left leg says there is nothing open 11/24/2020; 3 weeks follow-up. His left buttock still has the 3 open areas. We have been using polymen but  due to lack of response he changed to [facility] area. Surrounding skin is dry erythematous and irritated looking. There is no evidence of infection either bacterial or fungal however there is loss of surface epithelium He still has very dry skin in his foot causing irritation and erythema on the dorsal part of his toes. This is not responded to prolonged courses of antifungal simply looks dry and irritated 1/24; left buttock area still looks about the same he was unable to find the triad ointment that we had suggested. The area on the right lower leg just above the dorsal ankle has reopened and the areas on the right foot between the first second and second third toes and scaling on the bottom of the foot has been about the same for quite some time now. been using silver alginate to all wound areas 2/7; left buttock wound looked quite good although not much smaller in terms of surface area surrounding skin looks better. Only [name] few dry flaking areas on the right foot in between the first and second toes the skin generally looks better here [ammonium lactate]. Finally the area on the right dorsal ankle is closed 2/21; There is no open area on the right foot even between the right first and second toe. Skin around this area dorsally and plantar aspects look better. He has [name] reopening of the area on the right ankle just above the crease of the ankle dorsally.  I continue to think that this is probably friction from spasms may be even this time with his stocking under the compression stockings. Wounds on his left buttock look about the same there [name] couple of areas that have reopened. He has [name] total square area of loss of epithelialization. This does not look like infection it looks like [name] contact dermatitis but I just cannot determine to what 3/14; there is nothing on the right foot between the first and second toes this was carefully inspected under illumination. Some chronic irritation on the dorsal part of his foot from toes 1-3 at the base. Nothing really open here substantially. Still has an area on the right foot/ankle that is actually larger and hyper granulated. His buttock area on the left is just about closed however he has chronic inflammation with loss of the surface epithelial layer 3/28; 2-week follow-up. In clinic today with [name] new wound on the left anterior mid tibia. Says this happened about 2 weeks ago. He is not really sure how wonders about the spasticity of his legs at night whether that could have caused this other than that he does not have [name] good idea. He has been using topical antibiotics and silver alginate. The area on his right dorsal ankle seems somewhat better. Finally everything on his left buttock is closed. 4/11; 2-week follow-up. All of his wounds are better except for the area over the ischium and left buttock which have opened up widely again. At least part of this is covered in necrotic fibrinous material another part had rolled nonviable skin. The area on the right ankle, left anterior mid tibia are both [name] lot better. He had no open wounds on either foot including the areas between the first and second toes 4/25; patient presents for 2-week follow-up. He states that the wounds are overall stable. He has no complaints today and states he is using Hydrofera Blue to open wounds. 5/9; have not seen this man in over [name] month.  For my memory he has open areas on the  left mid tibia and right ankle. T oday he has new open area on the right dorsal foot which we have not had [name] problem with recently. He has the sustained area on the left buttock He is also changed his insurance at the beginning of the year [employer]. We will need prior authorizations for debridement 5/23; patient presents for 2-week follow-up. He has prior authorizations for debridement. He denies any issues in the past 2 weeks with his wound care. He has been using Hydrofera Blue to all the wounds. He does report [name] circular rash to the upper left leg that is new. He denies acute signs of infection. 6/6; 2-week follow-up. The patient has open wounds on the left buttock which are worse than the last time I saw this about [name] month ago. He also has [name] new area to me on the left anterior mid tibia with some surrounding erythema. The area on the dorsal ankle on the right is closed but I think this will be [name] friction injury every time this area is exposed to either our wraps or his compression stockings caused by unrelenting spasms in this leg. 6/20; 2-week follow-up. The patient has open wounds on the left buttock which is about the same. Using [facility] here. - The left mid tibia has [name] static amount of surrounding erythema. Also [name] raised area in the center. We have been using Hydrofera Blue here. Finally he has broken down in his dorsal right foot extending between the first and second toes and going to the base of the first and second toe webspace. I have previously assumed that this was severe venous hypertension 7/5; 2-week follow-up The left buttock wound actually looks better. We are using Hydrofera Blue. He has extensive skin irritation around this area and I have not really been able to get that any better. I have tried Lotrisone i.e. antifungals and steroids. More most recently we have just been using Coloplast really looks about the same. The left  mid tibia which was new last week culture to have very resistant staph aureus. Not only methicillin-resistant but doxycycline resistant. The patient has [name] plethora of antibiotic allergies including sulfa, linezolid. I used topical bacitracin on this but he has not started this yet. In addition he has an expanding area of erythema with [name] wound on the dorsal right foot. I did [name] deep tissue culture of this area today 7/12; Left buttock area actually looks better surrounding skin also looks less irritated. Left mid tibia looks about the same. He is using bacitracin this is not worse Right dorsal foot looks about the same as well. The left first toe also looks about the same 7/19; left buttock wound continues to improve in terms of open areas Left mid tibia is still concerning amount of swelling he is using bacitracin Dorsal left first toe somewhat smaller Right dorsal foot somewhat smaller 7/25; left buttock wound actually continues to improve Left mid tibia area has less swelling. I gave him all my samples of new Nuzyra. This seems to have helped although the wound is still open it. His abrasion closed by here Left dorsal great toe really no better. Still [name] very nonviable surface Right dorsal foot perhaps some better. We have been using bacitracin and silver nitrate to the areas on his lower legs and Hydrofera Blue to the area on the buttock. 8/16 Disappointed that his left buttock wound is actually more substantial. Apparently during the last nurse visit these were both very  small. He has continued irritation to [name] large area of skin on his buttock. I have never been able to totally explain this although I think it some combination of the way he sits, pressure, moisture. He is not incontinent enough to contribute to this. Left dorsal great toe still fibrinous debris on the surface that I have debrided today Large area across the dorsal right toes. The area on the left anterior mid tibia has less  swelling. He completed the [place]. This does not look infected although the tissue is still fried 8/30; 2-week follow-up. Left buttock areas not improved. We used Hydrofera Blue on this. Weeping wet with the surrounding erythema that I have not been able to control even with Lotrisone and topical Coloplast Left dorsal great toe looks about the same More substantial area again at the base of his toes on the left which is new this week. Area across the dorsal right toes looks improved The left anterior mid tibia looks like it is trying to close 9/13; 2-week follow-up. Using silver alginate on all of his wounds. The left dorsal foot does not look any better. He has the area on the dorsal toe and also the areas at the base of all of his toes 1 through 3. On the right foot he has [name] similar pattern in [name] similar area. He has the area on his left mid tibia that looks fairly healthy. Finally the area on his left buttock looks somewhat bette 9/20; culture I did of the left foot which was [name] deep tissue culture last time showed E. coli he has erythema around this wound. Still [name] completely necrotic surface. His right dorsal foot looks about the same. He has [name] very friable surface to the left anterior mid tibia. Both buttock wounds look better. We have been using silver alginate to all wounds 10/4; he has completed the cephalexin that I gave him last time for the left foot. He is using topical gentamicin under silver alginate silver alginate being applied to all the wounds. Unfortunately all the wounds look irritated on his dorsal right foot dorsal left foot left mid tibia. I wonder if this could be [name] silver allergy. I am going to change him to [facility] on the lower extremity. The skin on the left buttock and left posterior thigh still flaking dry and irritated. This has continued no matter what I have applied topically to this. He has [name] solitary open wound which by itself does not look too bad however the  entire area of surrounding skin does not change no matter what we have applied here 10/18; the area on the left dorsal foot and right dorsal foot both look better. The area on the right extends into the plantar but not between his toes. We have been using silver alginate. He still has [name] rectangular erythematous area around the area on the left tibia. The wound itself is very small. Finally everything on his left buttock looks [name] little larger the skin is erythematous 11/15; patient comes in with the left dorsal and right dorsal foot distally looking somewhat better. Still nonviable surface on the left foot which required debridement. He still has the area on the left anterior mid tibia although this looks somewhat better. He has [name] new area on the right lateral lower leg just above the ankle. Finally his left buttock looks terrible with multiple superficial open wounds geometric square shaped area of chronic erythema which I have not been able to sort through  11/29; right dorsal foot and left dorsal foot both look somewhat better. No debridement required. He has the fragile area on the left anterior mid tibia this looks and continues to look somewhat better. Right lateral lower leg just above the ankle we identified last time also looks better. In general the area on his left buttock looks improved. We are using Hydrofera Blue to all wound areas 12/13; right dorsal foot looks better. The area on the right lateral leg is healed. Left dorsal foot has 2 open areas both of which require debridement. The fragile area on the left anterior mid tibial looks better. Smaller area on his buttocks. Were using Hydrofera Blue 1/10; patient comes in with everything looking slightly larger and/or worse. This includes his left buttock, reopening of the left mid tibia, larger areas on the left dorsal foot and what looks to be [name] cellulitis on the right dorsal and plantar foot. We have been using Hydrofera Blue on all  wounds. 1/17; right dorsal foot distally looks better today. The left foot has 2 open wounds that are about the same surrounding erythema. 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 No malignancy they did not stain for fungus 1/24; his right distal foot is not closed dry and scaly but the wound looks like it is contracting. I did not debride anything here. Problem on his left dorsal foot with expanding erythema. Apparently there were problems last week getting the Elesa Hacker however it is now available at the [facility] but [name] week later. He is using ketoconazole and Coloplast to the left buttock along with Hydrofera Blue this actually looks quite [name] bit better today. 1/31; right dorsal foot again is dry and scaly but looking to contract. He has been using [name] moisturizer on his feet at my request but he is not sure which 1. The left dorsal foot wounds look about the same there is erythema here that I marked last week however after course of Nuzyra it certainly is not any better but not any worse either. Finally on his left buttock the skin continues to look better he has the original wound but [name] new substantial area towards the gluteal cleft. Almost like [name] skin tear. I used scissors to remove skin and subcutaneous tissue here silver nitrate and direct pressure 2/7; right dorsal foot. This does not look too much different from last week. Some erythema skin dry and scaly. No debridement. Left dorsal great toe again still not much improvement. I did remove flaking dry skin and callus from around the edge. Finally on his left buttock. The skin is somewhat better in the periwound. Surface wounds are superficial somewhat better than last week. 01/26/2022: Is [name] little bit of [name] mystery as to why his wounds fail to respond to treatments and actually seem to get worse. This is  my first encounter with this patient. He was previously followed by Dr. Dellia Nims. Based upon my review of the chart, it seems that there is [name] little bit of [name] mystery as to why his wounds do not respond as anticipated to the interventions applied and sometimes even get worse. Biopsies have been performed and he was seen by dermatology in [place], but that did not shed any light on the matter. T oday, his gluteal wound is larger, with substantial drainage, rather malodorous. The food wounds are not terrible, but he has [name] lot  of callus and scaly skin around these. He is currently getting silver alginate on the gluteal wound, with idodoflex to the feet. He is using lotrisone on his legs for the dry, scaly skin. 02/09/2022: There has really been no change to any of his wounds. The gluteal wound less drainage and odor, but remains about the same size, the periwound skin remains oddly scaly. His lower extremity wounds also appear roughly the same size. They continue to accumulate [name] small amount of slough. The periwound on his feet and ankle wounds has dry eschar and loose dead skin. We have been using silver alginate on the gluteal wound and Iodoflex on his feet and ankle wounds. T the periwound around his gluteal lesion and Lotrisone on his feet and legs. o 02/23/2022: The right plantar foot wound is closed. The gluteal site looks small but has continued to produce hypertrophic granulation tissue. The foot wounds all look about the same on the dorsal surface of the right foot; on the left, there is only [name] small open area at the site of where his left great toenail would have been. 03/16/2022: The right ankle wound is healed. The right dorsal foot wound is about the same. The left dorsal foot wound is quite [name] bit smaller and the ischial wound is nearly closed. 03/30/2022: The right ankle wound reopened. Both dorsal foot wounds are quite [name] bit smaller. Unfortunately, he appears to have sheared part of his ischial  wound open further, perhaps during [name] transfer. 04/13/2022: The right ankle wound has hypertrophic granulation tissue present. The dorsal foot wounds continue to decrease in size. The ischial wound looks about the same today, no better, no worse. 04/27/2022: The right ankle wound has closed. Unfortunately, it looks like some moisture got underneath the dry skin on both of his dorsal feet and these wounds have expanded in size. The ischial wound remains the same with perhaps [name] little bit more slough accumulation than at our previous visit. 05/11/2022: The right ankle wound remains closed. There is [name] left anterior tibial wound that is small has patchy openings with accumulated slough. The dorsum of his right foot appears to be nearly healed with just [name] small punctate opening. The plantar surface of his right foot has [name] new opening that looks like he may have picked some skin there. His sacral ulcer has hypertrophic granulation tissue but has some slough accumulation. The dorsum of his left foot has multiple open areas in [name] fairly ragged distribution. All of these have slough accumulated within them. 06/01/2022: The right ankle and left anterior tibial wound are both closed. Dorsum of his right foot and left foot both look substantially better with just tiny scattered openings The without any slough accumulation. He has sheared open new areas on his left gluteus and ischium. He says that his wheelchair cushion, which is air-filled, has [name] leak and so it keeps deflating. He is awaiting [name] new cushion. 06/15/2022: The right ankle wound has reopened and the fat layer is exposed. Both dorsal feet have just small openings with just [name] little bit of slough and eschar accumulation. The wound on his left gluteus and ischium is larger again today and has [name] foul odor. 06/29/2022: The right ankle wound has hypertrophic granulation tissue buildup. His dorsal foot wounds both look better with just some eschar on the surface.  He has [name] new wound on his left lateral ankle. He is not sure how he acquired it but by appearance, it looks that he  hit it on something, potentially his wheelchair or bed. The ischial wound is about the same but is cleaner without any significant purulent drainage or odor. He did not understand what the [facility] call was about and therefore he does not have the topical compounded antibiotic. 07/13/2022: The right ankle wound again has hypertrophic granulation tissue, but less so than at his previous visit. The ischial wound has improved tremendously the use of the [facility] topical antibiotic. No significant change to the left lateral ankle wound; it is fibrotic with slough present. The skin of both of his feet, especially on the right, has [name] yeasty appearance. Electronic Signature(s) Signed: 07/13/2022 9:33:09 AM By: Fredirick Maudlin MD FACS Previous Signature: 07/13/2022 9:28:49 AM Version By: Fredirick Maudlin MD FACS Entered By: Fredirick Maudlin on 07/13/2022 09:33:09 -------------------------------------------------------------------------------- Chemical Cauterization Details Patient Name: Date of Service: [name], [name] 07/13/2022 8:15 [name] Medical Record Number: [id number] Patient Account Number: [id number] Date of Birth/Sex: Treating RN: 02/26/1986 (36 y.o. [name]: [place], Cu[place] Other Clinician: Referring Provider: Treating Provider/Extender: Cornelia Copa [name]: [phone] Procedure Performed for: Wound #66 Right,Anterior Ankle Performed By: Physician Fredirick Maudlin, MD Post Procedure Diagnosis Same as Pre-procedure Notes using silver nitrate stick Electronic Signature(s) Signed: 07/13/2022 10:17:20 AM By: Fredirick Maudlin MD FACS Signed: 07/13/2022 5:37:55 PM By: Baruch Gouty RN, BSN Entered By: Baruch Gouty on 07/13/2022  09:23:25 -------------------------------------------------------------------------------- Physical Exam Details Patient Name: Date of Service: [name], [name] 07/13/2022 8:15 [name] Medical Record Number: [id number] Patient Account Number: [id number] Date of Birth/Sex: Treating RN: 05/11/1986 (36 y.o. [name]: [place], [place] Other Clinician: Referring Provider: Treating Provider/Extender: Graciela Husbands, [name]: 340 Constitutional . . . . No acute distress.Marland Kitchen Respiratory Normal work of breathing on room air.. Notes 07/13/2022: The right ankle wound again has hypertrophic granulation tissue, but less so than at his previous visit. The ischial wound has improved tremendously the use of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 healed and I think the area on his right dorsal foot is healed as well. There is no cellulitis or venous inflammation involving the right leg. He is going to need compression stockings here. 10/07/16; the patient's extensive wound on the left lateral calf and ankle does not measure any differently however there appears to be less adherent surface slough using Santyl and aggressive weekly debridements 10/21/16; no major change in the area on the left lateral calf. Still the same measurement still very difficult to debridement adherent slough and nonviable subcutaneous  tissue. This is not really been helped by several weeks of Santyl. Previously for 2 weeks I used Iodoflex for [name] short period. [name] prolonged course of Hydrofera Blue  didn't really help. I'm not sure why I only used 2 weeks of Iodoflex on this there is no evidence of surrounding infection. He has [name] small area on the right second toe which looks as though it's progressing towards closure 10/28/16; the wounds on his toes appear to be closed. No major change in the left lateral leg wound although the surface looks somewhat better using Iodoflex. He has had previous arterial studies that were normal. He has had reflux studies and is status post ablation although I don't have any exact notes on which vein was ablated. I'll need to check the surgical record 11/04/16; he's had [name] reopening between the first and second toe on the left and right. No major change in the left lateral leg wound. There is what appears to be cellulitis of the left dorsal foot 11/18/16 the patient was hospitalized initially in [place] and then subsequently transferred to [facility] and was admitted there from 11/09/16 through 11/12/16. He had developed progressive cellulitis on the right leg in spite of the doxycycline I gave him. I'd spoken to the hospitalist in [place] who was concerned about continuing leukocytosis. CT scan is what I suggested this was done which showed soft tissue swelling without evidence of osteomyelitis or an underlying abscess blood cultures were negative. At [facility] he was treated with vancomycin and Primaxin and then add an infectious disease consult. He was transitioned to Ceftaroline. He has been making progressive improvement. Overall [name] severe cellulitis of the right leg. He is been using silver alginate to her original wound on the left leg. The wounds in his toes on the right are closed there is [name] small open area on the base of the left second toe 11/26/15; the patient's right leg is much better although  there is still some edema here this could be reminiscent from his severe cellulitis likely on top of some degree of lymphedema. His left anterior leg wound has less surface slough as reported by her intake nurse. Small wound at the base of the left second toe 12/02/16; patient's right leg is better and there is no open wound here. His left anterior lateral leg wound continues to have [name] healthy-looking surface. Small wound at the base of the left second toe however there is erythema in the left forefoot which is worrisome 12/16/16; is no open wounds on his right leg. We took measurements for stockings. His left anterior lateral leg wound continues to have [name] healthy-looking surface. I'm not sure where we were with the Apligraf run through his insurance. We have been using Iodoflex. He has [name] thick eschar on the left first second toe interface, I suspect this may be fungal however there is no visible open 12/23/16; no open wound on his right leg. He has 2 small areas left of the linear wound that was remaining last week. We have been using Prisma, I thought I have disclosed this week, we can only look forward to next week 01/03/17; the patient had concerning areas of erythema last week, already on doxycycline for UTI through his primary doctor. The erythema is absolutely no better there is warmth and swelling both medially from the left lateral leg wound and also the dorsal left foot. 01/06/17- Patient is here for follow-up evaluation of his left lateral leg ulcer and bilateral feet ulcers. He is on oral antibiotic therapy, tolerating that. Nursing staff and the patient states that the erythema is improved from Monday. 01/13/17; the predominant left lateral leg wound continues  to be problematic. I had put Apligraf on him earlier this month once. However he subsequently developed what appeared to be an intense cellulitis around the left lateral leg wound. I gave him Dalvance I think on 2/12 perhaps 2/13 he  continues on cefdinir. The erythema is still present but the warmth and swelling is improved. I am hopeful that the cellulitis part of this control. I wouldn't be surprised if there is an element of venous inflammation as well. 01/17/17. The erythema is present but better in the left leg. His left lateral leg wound still does not have [name] viable surface buttons certain parts of this long thin wound it appears like there has been improvement in dimensions. 01/20/17; the erythema still present but much better in the left leg. I'm thinking this is his usual degree of chronic venous inflammation. The wound on the left leg looks somewhat better. Is less surface slough 01/27/17; erythema is back to the chronic venous inflammation. The wound on the left leg is somewhat better. I am back to the point where I like to try an Apligraf once again 02/10/17; slight improvement in wound dimensions. Apligraf #2. He is completing his doxycycline 02/14/17; patient arrives today having completed doxycycline last Thursday. This was supposed to be [name] nurse visit however once again he hasn't tense erythema from the medial part of his wound extending over the lower leg. Also erythema in his foot this is roughly in the same distribution as last time. He has baseline chronic venous inflammation however this is [name] lot worse than the baseline I have learned to accept the on him is baseline inflammation 02/24/17- patient is here for follow-up evaluation. He is tolerating compression therapy. His voicing no complaints or concerns he is here anticipating an Apligraf 03/03/17; he arrives today with an adherent necrotic surface. I don't think this is surface is going to be amenable for Apligraf's. The erythema around his wound and on the left dorsal foot has resolved he is off antibiotics 03/10/17; better-looking surface today. I don't think he can tolerate Apligraf's. He tells me he had [name] wound VAC after [name] skin graft years ago to this area  and they had difficulty with [name] seal. The erythema continues to be stable around this some degree of chronic venous inflammation but he also has recurrent cellulitis. We have been using Iodoflex 03/17/17; continued improvement in the surface and may be small changes in dimensions. Using Iodoflex which seems the only thing that will control his surface 03/24/17- He is here for follow up evaluation of his LLE lateral ulceration and ulcer to right dorsal foot/toe space. He is voicing no complaints or concerns, He is tolerating compression wrap. 03/31/17 arrives today with [name] much healthier looking wound on the left lower extremity. We have been using Iodoflex for [name] prolonged period of time which has for the first time prepared and adequate looking wound bed although we have not had much in the way of wound dimension improvement. He also has [name] small wound between the first and second toe on the right 04/07/17; arrives today with [name] healthy-looking wound bed and at least the top 50% of this wound appears to be now her. No debridement was required I have changed him to [facility] last week after prolonged Iodoflex. He did not do well with Apligraf's. We've had [name] re-opening between the first and second toe on the right 04/14/17; arrives today with [name] healthier looking wound bed contractions and the top 50% of this  wound and some on the lesser 50%. Wound bed appears healthy. The area between the first and second toe on the right still remains problematic 04/21/17; continued very gradual improvement. Using [facility] 04/28/17; continued very gradual improvement in the left lateral leg venous insufficiency wound. His periwound erythema is very mild. We have been using Hydrofera Blue. Wound is making progress especially in the superior 50% 05/05/17; he continues to have very gradual improvement in the left lateral venous insufficiency wound. Both in terms with an length rings are improving. I debrided this every 2  weeks with #5 curet and we have been using Hydrofera Blue and again making good progress With regards to the wounds between his right first and second toe which I thought might of been tinea pedis he is not making as much progress very dry scaly skin over the area. Also the area at the base of the left first and second toe in [name] similar condition 05/12/17; continued gradual improvement in the refractory left lateral venous insufficiency wound on the left. Dimension smaller. Surface still requiring debridement using Hydrofera Blue 05/19/17; continued gradual improvement in the refractory left lateral venous ulceration. Careful inspection of the wound bed underlying rumination suggested some degree of epithelialization over the surface no debridement indicated. Continue Hydrofera Blue difficult areas between his toes first and third on the left than first and second on the right. I'm going to change to silver alginate from silver collagen. Continue ketoconazole as I suspect underlying tinea pedis 05/26/17; left lateral leg venous insufficiency wound. We've been using Hydrofera Blue. I believe that there is expanding epithelialization over the surface of the wound albeit not coming from the wound circumference. This is [name] bit of an odd situation in which the epithelialization seems to be coming from the surface of the wound rather than in the exact circumference. There is still small open areas mostly along the lateral margin of the wound. ooHe has unchanged areas between the left first and second and the right first second toes which I been treating for tenia pedis 06/02/17; left lateral leg venous insufficiency wound. We have been using Hydrofera Blue. Somewhat smaller from the wound circumference. The surface of the wound remains [name] bit on it almost epithelialized sedation in appearance. I use an open curette today debridement in the surface of all of this especially the edges ooSmall open wounds remaining  on the dorsal right first and second toe interspace and the plantar left first second toe and her face on the left 06/09/17; wound on the left lateral leg continues to be smaller but very gradual and very dry surface using Hydrofera Blue 06/16/17 requires weekly debridements now on the left lateral leg although this continues to contract. I changed to silver collagen last week because of dryness of the wound bed. Using Iodoflex to the areas on his first and second toes/web space bilaterally 06/24/17; patient with history of paraplegia also chronic venous insufficiency with lymphedema. Has [name] very difficult wound on the left lateral leg. This has been gradually reducing in terms of with but comes in with [name] very dry adherent surface. High switch to silver collagen [name] week or so ago with hydrogel to keep the area moist. This is been refractory to multiple dressing attempts. He also has areas in his first and second toes bilaterally in the anterior and posterior web space. I had been using Iodoflex here after [name] prolonged course of silver alginate with ketoconazole was ineffective [question tinea pedis] 07/14/17; patient  arrives today with [name] very difficult adherent material over his left lateral lower leg wound. He also has surrounding erythema and poorly controlled edema. He was switched his Santyl last visit which the nurses are applying once during his doctor visit and once on [name] nurse visit. He was also reduced to 2 layer compression I'm not exactly sure of the issue here. 07/21/17; better surface today after 1 week of Iodoflex. Significant cellulitis that we treated last week also better. [Doxycycline] 07/28/17 better surface today with now 2 weeks of Iodoflex. Significant cellulitis treated with doxycycline. He has now completed the doxycycline and he is back to his usual degree of chronic venous inflammation/stasis dermatitis. He reminds me he has had ablations surgery here 08/04/17; continued improvement with  Iodoflex to the left lateral leg wound in terms of the surface of the wound although the dimensions are better. 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 Problematic areas on the plantar aspect of the first second toe web space on the left and the dorsal aspect of the first second toe web space on the right. At one point I felt these were probably related to chronic fungal infections in treated him aggressively for this although we have not made any improvement here. 08/11/17; left lateral leg. Surface continues to improve with the Iodoflex although we are not seeing much improvement in overall wound dimensions. Areas on his plantar left foot and right foot show no improvement. In fact the right foot looks somewhat worse 08/18/17; left lateral leg. We changed to [facility] Blue last week after [name] prolonged course of Iodoflex which helps get the surface better. It appears that the wound with is improved. Continue with difficult areas on the left dorsal first second and plantar first second on the right 09/01/17; patient arrives in clinic today having had [name] temperature of 103 yesterday. He was seen in the ER and [facility]. The patient was concerned he could have cellulitis again in the right leg however they diagnosed him with [name] UTI and he is now on Keflex. He has [name] history of cellulitis which is been recurrent and difficult but this is been in the left leg, in the past 5 use doxycycline. He does in and out catheterizations at home which are risk factors for UTI 09/08/17; patient will be completing his Keflex this weekend. The erythema on the left leg is considerably better. He has [name] new wound today on the medial part of the right leg small superficial almost looks like [name] skin tear. He has worsening of the area on the right dorsal first and second toe. His major area on the left lateral leg is better. Using Hydrofera Blue on all  areas 09/15/17; gradual reduction in width on the long wound in the left lateral leg. No debridement required. He also has wounds on the plantar aspect of his left first second toe web space and on the dorsal aspect of the right first second toe web space. 09/22/17; there continues to be very gradual improvements in the dimensions of the left lateral leg wound. He hasn't round erythematous spot with might be pressure on his wheelchair. There is no evidence obviously of infection no purulence no warmth ooHe has [name] dry scaled area on the plantar aspect of the left first second toe ooImproved area on the dorsal right first second toe. 09/29/17; left lateral leg wound continues to improve in dimensions mostly with an is still [name] fairly  long but increasingly narrow wound. ooHe has [name] dry scaled area on the plantar aspect of his left first second toe web space ooIncreasingly concerning area on the dorsal right first second toe. In fact I am concerned today about possible cellulitis around this wound. The areas extending up his second toe and although there is deformities here almost appears to abut on the nailbed. 10/06/17; left lateral leg wound continues to make very gradual progress. Tissue culture I did from the right first second toe dorsal foot last time grew MRSA and enterococcus which was vancomycin sensitive. This was not sensitive to clindamycin or doxycycline. He is allergic to Zyvox and sulfa we have therefore arrange for him to have dalvance infusion tomorrow. He is had this in the past and tolerated it well 10/20/17; left lateral leg wound continues to make decent progress. This is certainly reduced in terms of with there is advancing epithelialization.ooThe cellulitis in the right foot looks better although he still has [name] deep wound in the dorsal aspect of the first second toe web space. Plantar left first toe web space on the left I think is making some progress 10/27/17; left lateral leg  wound continues to make decent progress. Advancing epithelialization.using Hydrofera Blue ooThe right first second toe web space wound is better-looking using silver alginate ooImprovement in the left plantar first second toe web space. Again using silver alginate 11/03/17 left lateral leg wound continues to make decent progress albeit slowly. Using Hydrofera Blue ooThe right per second toe web space continues to be [name] very problematic looking punched out wound. I obtained [name] piece of tissue for deep culture I did extensively treated this for fungus. It is difficult to imagine that this is [name] pressure area as the patient states other than going outside he doesn't really wear shoes at home ooThe left plantar first second toe web space looked fairly senescent. Necrotic edges. This required debridement oochange to Hydrofera Blue to all wound areas 11/10/17; left lateral leg wound continues to contract. Using Hydrofera Blue ooOn the right dorsal first second toe web space dorsally. Culture I did of this area last week grew MRSA there is not an easy oral option in this patient was multiple antibiotic allergies or intolerances. This was only [name] rare culture isolate I'm therefore going to use Bactroban under silver alginate ooOn the left plantar first second toe web space. Debridement is required here. This is also unchanged 11/17/17; left lateral leg wound continues to contract using Hydrofera Blue this is no longer the major issue. ooThe major concern here is the right first second toe web space. He now has an open area going from dorsally to the plantar aspect. There is now wound on the inner lateral part of the first toe. Not [name] very viable surface on this. There is erythema spreading medially into the forefoot. ooNo major change in the left first second toe plantar wound 11/24/17; left lateral leg wound continues to contract using Hydrofera Blue. Nice improvement today ooThe right first second toe  web space all of this looks [name] lot less angry than last week. I have given him clindamycin and topical Bactroban for MRSA and terbinafine for the possibility of underlining tinea pedis that I could not control with ketoconazole. Looks somewhat better ooThe area on the plantar left first second toe web space is weeping with dried debris around the wound 12/01/17; left lateral leg wound continues to contract he Hydrofera Blue. It is becoming thinner in terms of with nevertheless  it is making good improvement. ooThe right first second toe web space looks less angry but still [name] large necrotic-looking wounds starting on the plantar aspect of the right foot extending between the toes and now extensively on the base of the right second toe. I gave him clindamycin and topical Bactroban for MRSA anterior benefiting for the possibility of underlying tinea pedis. Not looking better today ooThe area on the left first/second toe looks better. Debrided of necrotic debris 12/05/17* the patient was worked in urgently today because over the weekend he found blood on his incontinence bad when he woke up. He was found to have an ulcer by his wife who does most of his wound care. He came in today for [place] to look at this. He has not had [name] history of wounds in his buttocks in spite of his paraplegia. 12/08/17; seen in follow-up today at his usual appointment. He was seen earlier this week and found to have [name] new wound on his buttock. We also follow him for wounds on the left lateral leg, left first second toe web space and right first second toe web space 12/15/17; we have been using Hydrofera Blue to the left lateral leg which has improved. The right first second toe web space has also improved. Left first second toe web space plantar aspect looks stable. The left buttock has worsened using Santyl. Apparently the buttock has drainage 12/22/17; we have been using Hydrofera Blue to the left lateral leg which continues to  improve now 2 small wounds separated by normal skin. He tells [place] he had [name] fever up to 100 yesterday he is prone to UTIs but has not noted anything different. He does in and out catheterizations. The area between the first and second toes today does not look good necrotic surface covered with what looks to be purulent drainage and erythema extending into the third toe. I had gotten this to something that I thought look better last time however it is not look good today. He also has [name] necrotic surface over the buttock wound which is expanded. I thought there might be infection under here so I removed [name] lot of the surface with [name] #5 curet though nothing look like it really needed culturing. He is been using Santyl to this area 12/27/17; his original wound on the left lateral leg continues to improve using Hydrofera Blue. I gave him samples of Baxdella although he was unable to take them out of fear for an allergic reaction ["lump in his throat"].the culture I did of the purulent drainage from his second toe last week showed both enterococcus and [name] set Enterobacter I was also concerned about the erythema on the bottom of his foot although paradoxically although this looks somewhat better today. 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 We've been using silver alginate here. They came up today that he sleeps in [name] recliner, I'm not sure why but I asked him to stop this 01/03/18; his original wound we've been using Hydrofera Blue is now separated into 2 areas. ooUlcer on his left buttock is better he is off the recliner and sleeping in bed ooFinally both wound areas between his first and second toes also looks some better 01/10/18; his original wound on the left lateral leg is now separated into 2 wounds we've been using Hydrofera Blue ooUlcer on his left buttock has some drainage. There is [name] small  probing site going  into muscle layer superiorly.using silver alginate -He arrives today with [name] deep tissue injury on the left heel ooThe wound on the dorsal aspect of his first second toe on the left looks [name] lot betterusing silver alginate ketoconazole ooThe area on the first second toe web space on the right also looks [name] lot bette 01/17/18; his original wound on the left lateral leg continues to progress using Hydrofera Blue ooUlcer on his left buttock also is smaller surface healthier except for [name] small probing site going into the muscle layer superiorly. 2.4 cm of tunneling in this area ooDTI on his left heel we have only been offloading. Looks better than last week no threatened open no evidence of infection oothe wound on the dorsal aspect of the first second toe on the left continues to look like it's regressing we have only been using silver alginate and terbinafine orally ooThe area in the first second toe web space on the right also looks to be [name] lot better using silver alginate and terbinafine I think this was prompted by tinea pedis 01/31/18; the patient was hospitalized in [place] [place] last week apparently for [name] complicated UTI. He was discharged on cefepime he does in and out catheterizations. In the hospital he was discovered M I don't mild elevation of AST and ALT and the terbinafine was stopped.predictably the pressure ulcer on s his buttock looks betterusing silver alginate. The area on the left lateral leg also is better using Hydrofera Blue. The area between the first and second toes on the left better. First and second toes on the right still substantial but better. Finally the DTI on the left heel has held together and looks like it's resolving 02/07/18-he is here in follow-up evaluation for multiple ulcerations. He has new injury to the lateral aspect of the last issue [name] pressure ulcer, he states this is from adhesive removal trauma. He states he has tried multiple adhesive products with no  success. All other ulcers appear stable. The left heel DTI is resolving. We will continue with same treatment plan and follow-up next week. 02/14/18; follow-up for multiple areas. ooHe has [name] new area last week on the lateral aspect of his pressure ulcer more over the posterior trochanter. The original pressure ulcer looks quite stable has healthy granulation. 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 Almost fully epithelialized on the original superior area using Hydrofera Blue ooDTI on the left heel has peeled off this week to reveal [name] small superficial wound under denuded skin and subcutaneous tissue ooBoth areas between the first and second toes look better including nothing open on the left 02/21/18; ooThe patient's wounds on his left ischial tuberosity and posterior left greater trochanter actually looked better. He has [name] large area of irritation around the area which I think is contact dermatitis. I am doubtful that this is fungal ooHis original wound on the left lateral calf continues to improve we have been using Hydrofera Blue ooThere is no open area in the left first second toe web space although there is [name] lot of thick callus ooThe DTI on the left heel required debridement today of necrotic surface eschar and subcutaneous tissue using silver alginate ooFinally the area on the right first second toe webspace continues to contract using silver alginate and ketoconazole 02/28/18 ooLeft ischial tuberosity wounds look better using silver alginate. ooOriginal wound on the left calf only  has one small open area left using Hydrofera Blue ooDTI on the left heel required debridement mostly removing skin from around this wound surface. Using silver alginate ooThe areas on the right first/second toe web space using silver alginate and ketoconazole 03/08/18 on evaluation today patient  appears to be doing decently well as best I can tell in regard to his wounds. This is the first time that I have seen him as he generally is followed by Dr. Dellia Nims. With that being said none of his wounds appear to be infected he does have an area where there is some skin covering what appears to be [name] new wound on the left dorsal surface of his great toe. This is right at the nail bed. With that being said I do believe that debrided away some of the excess skin can be of benefit in this regard. Otherwise he has been tolerating the dressing changes without complication. 03/14/18; patient arrives today with the multiplicity of wounds that we are following. He has not been systemically unwell ooOriginal wound on the left lateral calf now only has 2 small open areas we've been using Hydrofera Blue which should continue ooThe deep tissue injury on the left heel requires debridement today. We've been using silver alginate ooThe left first second toe and the right first second toe are both are reminiscence what I think was tinea pedis. Apparently some of the callus Surface between the toes was removed last week when it started draining. ooPurulent drainage coming from the wound on the ischial tuberosity on the left. 03/21/18-He is here in follow-up evaluation for multiple wounds. 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 He tolerated debridement. The only change to last week's recommendations is to discontinue antifungal cream between toes. He will follow-up next week 03/28/18; following up for multiple wounds;Concern this week is streaking redness and swelling in the right foot. He is going to need antibiotics for this. 03/31/18; follow-up for right foot cellulitis. Streaking redness and swelling in the right foot on 03/28/18. 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 I put him on clindamycin 300 mg every 6 and  brought him in for [name] quick check. He has an open wound between his first and second toes on the right foot as [name] potential source. 04/04/18; ooRight foot cellulitis is resolving he is completing clindamycin. This is truly good news ooLeft lateral calf wound which is initial wound only has one small open area inferiorly this is close to healing out. He has compression stockings. We will use Hydrofera Blue right down to the epithelialization of this ooNonviable surface on the left heel which was initially pressure with [name] DTI. We've been using Hydrofera Blue. I'm going to switch this back to silver alginate ooLeft first second toe/tinea pedis this looks better using silver alginate ooRight first second toe tinea pedis using silver alginate ooLarge pressure ulcers on theLeft ischial tuberosity. Small wound here Looks better. I am uncertain about the surface over the large wound. Using silver alginate 04/11/18; ooCellulitis in the right foot is resolved ooLeft lateral calf wound which was his original wounds still has 2 tiny open areas remaining this is just about closed ooNonviable surface on the left heel is better but still requires debridement ooLeft first second toe/tinea pedis still open using silver alginate ooRight first second toe wound tinea pedis I asked him to go back to using ketoconazole and silver alginate ooLarge pressure ulcers on  the left ischial tuberosity this shear injury here is resolved. Wound is smaller. No evidence of infection using silver alginate 04/18/18; ooPatient arrives with an intense area of cellulitis in the right mid lower calf extending into the right heel area. Bright red and warm. Smaller area on the left anterior leg. He has [name] significant history of MRSA. He will definitely need antibioticsoodoxycycline ooHe now has 2 open areas on the left ischial tuberosity the original large wound and now [name] satellite area which I think was above his initial  satellite areas. Not [name] wonderful surface on this satellite area surrounding erythema which looks like pressure related. ooHis left lateral calf wound again his original wound is just about closed ooLeft heel pressure injury still requiring debridement ooLeft first second toe looks [name] lot better using silver alginate ooRight first second toe also using silver alginate and ketoconazole cream also looks better 04/20/18; the patient was worked in early today out of concerns with his cellulitis on the right leg. I had started him on doxycycline. This was 2 days ago. His wife was concerned about the swelling in the area. Also concerned about the left buttock. He has not been systemically unwell no fever chills. No nausea vomiting or diarrhea 04/25/18; the patient's left buttock wound is continued to deteriorate he is using Hydrofera Blue. He is still completing clindamycin for the cellulitis on the right leg although all of this looks better. 05/02/18 ooLeft buttock wound still with [name] lot of drainage and [name] very tightly adherent fibrinous necrotic surface. He has [name] deeper area superiorly ooThe left lateral calf wound is still closed ooDTI wound on the left heel necrotic surface especially the circumference using Iodoflex ooAreas between his left first second toe and right first second toe both look better. Dorsally and the right first second toe he had [name] necrotic surface although at smaller. In using silver alginate and ketoconazole. I did [name] culture last week which was [name] deep tissue culture of the reminiscence of the open wound on the right first second toe dorsally. This grew [name] few Acinetobacter and [name] few methicillin-resistant staph aureus. Nevertheless the area actually this week looked better. I didn't feel the need to specifically address this at least in terms of systemic antibiotics. 05/09/18; wounds are measuring larger more drainage per our intake. We are using Santyl covered with alginate on  the large superficial buttock wounds, Iodosorb on the left heel, ketoconazole and silver alginate to the dorsal first and second toes bilaterally. 05/16/18; ooThe area on his left buttock better in some aspects although the area superiorly over the ischial tuberosity required an extensive debridement.using Santyl ooLeft heel appears stable. Using Iodoflex ooThe areas between his first and second toes are not bad however there is spreading erythema up the dorsal aspect of his left foot this looks like cellulitis again. He is insensate the erythema is really very brilliant.o Erysipelas He went to see an allergist days ago because he was itching part of this he had lab work done. This showed [name] white count of 15.1 with 70% neutrophils. Hemoglobin of 11.4 and [name] platelet count of 659,000. Last white count we had in Epic was [name] 2-1/2 years ago which was 25.9 but he was ill at the time. He was able to show me some lab work that was done by his primary physician the pattern is about the same. 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 But prompted me  to go ahead and do x-rays of both feet and the pelvis rule out osteomyelitis. He also had [name] comprehensive metabolic panel this was reasonably normal his albumin was 3.7 liver function tests BUN/creatinine all normal 05/23/18; x-rays of both his feet from last week were negative for underlying pulmonary abnormality. The x-ray of his pelvis however showed mild irregularity in the left ischial which may represent some early osteomyelitis. The wound in the left ischial continues to get deeper clearly now exposed muscle. Each week necrotic surface material over this area. Whereas the rest of the wounds do not look so bad. ooThe left ischial wound we have been using Santyl and calcium alginate ooT the left heel surface necrotic debris using Iodoflex o ooThe left lateral leg is still healed ooAreas on the left dorsal  foot and the right dorsal foot are about the same. There is some inflammation on the left which might represent contact dermatitis, fungal dermatitis I am doubtful cellulitis although this looks better than last week 05/30/18; CT scan done at Hospital did not show any osteomyelitis or abscess. Suggested the possibility of underlying cellulitis although I don't see [name] lot of evidence of this at the bedside ooThe wound itself on the left buttock/upper thigh actually looks somewhat better. No debridement ooLeft heel also looks better no debridement continue Iodoflex ooBoth dorsal first second toe spaces appear better using Lotrisone. Left still required debridement 06/06/18; ooIntake reported some purulent looking drainage from the left gluteal wound. Using Santyl and calcium alginate ooLeft heel looks better although still [name] nonviable surface requiring debridement ooThe left dorsal foot first/second webspace actually expanding and somewhat deeper. I may consider doing [name] shave biopsy of this area ooRight dorsal foot first/second webspace appears stable to improved. Using Lotrisone and silver alginate to both these areas 06/13/18 ooLeft gluteal surface looks better. Now separated in the 2 wounds. No debridement required. Still drainage. We'll continue silver alginate ooLeft heel continues to look better with Iodoflex continue this for at least another week ooOf his dorsal foot wounds the area on the left still has some depth although it looks better than last week. We've been using Lotrisone and silver alginate 06/20/18 ooLeft gluteal continues to look better healthy tissue ooLeft heel continues to look better healthy granulation wound is smaller. He is using Iodoflex and his long as this continues continue the Iodoflex ooDorsal right foot looks better unfortunately dorsal left foot does not. There is swelling and erythema of his forefoot. He had minor trauma to this several days ago but  doesn't think this was enough to have caused any tissue injury. Foot looks like cellulitis, we have had this problem before 06/27/18 on evaluation today patient appears to be doing [name] little worse in regard to his foot ulcer. 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 Both of which would really be his only options for treating this infection. In the past he has been given and effusion of Orbactiv. This is done very well for him in the past again it's one time dosing IV antibiotic therapy. Subsequently I do believe this is something we're gonna need to see about doing at this point in time. Currently his other wounds seem to be doing somewhat better in my pinion I'm pretty happy in that regard. 07/03/18 on evaluation today patient's wounds actually appear to be doing fairly well. He  has been tolerating the dressing changes without complication. All in all he seems to be showing signs of improvement. In regard to the antibiotics he has been dealing with infectious disease since I saw him last week as far as getting this scheduled. In the end [facility]'s [facility] this coming Friday. In the meantime he has been continuing to perform the dressing changes in such as previous. There does not appear to be any evidence of infection worsengin at this time. 07/10/18; ooSince I last saw this man 2 weeks ago things have actually improved. IV antibiotics of resulted in less forefoot erythema although there is still some present. He is not systemically unwell ooLeft buttock wounds o2 now have no depth there is increased epithelialization Using silver alginate ooLeft heel still requires debridement using Iodoflex ooLeft dorsal foot still with [name] sizable wound about the size of [name] border but healthy granulation ooRight dorsal  foot still with [name] slitlike area using silver alginate 07/18/18; the patient's cellulitis in the left foot is improved in fact I think it is on its way to resolving. ooLeft buttock wounds o2 both look better although the larger one has hypertension granulation we've been using silver alginate ooLeft heel has some thick circumferential redundant skin over the wound edge which will need to be removed today we've been using Iodoflex ooLeft dorsal foot is still [name] sizable wound required debridement using silver alginate ooThe right dorsal foot is just about closed only [name] small open area remains here 07/25/18; left foot cellulitis is resolved ooLeft buttock wounds o2 both look better. Hyper-granulation on the major area ooLeft heel as some debris over the surface but otherwise looks [name] healthier wound. Using silver collagen ooRight dorsal foot is just about closed 07/31/18; arrives with our intake nurse worried about purulent drainage from the buttock. We had hyper-granulation here last week ooHis buttock wounds o2 continue to look better ooLeft heel some debris over the surface but measuring smaller. ooRight dorsal foot unfortunately has openings between the toes ooLeft foot superficial wound looks less aggravated. 08/07/18 ooButtock wounds continue to look better although some of her granulation and the larger medial wound. silver alginate ooLeft heel continues to look [name] lot better.silver collagen ooLeft foot superficial wound looks less stable. Requires debridement. He has [name] new wound superficial area on the foot on the lateral dorsal foot. ooRight foot looks better using silver alginate without Lotrisone 08/14/2018; patient was in the ER last week diagnosed with [name] UTI. He is now on Cefpodoxime and Macrodantin. ooButtock wounds continued to be smaller. Using silver alginate ooLeft heel continues to look better using silver collagen ooLeft foot superficial wound looks as though it is  improving ooRight dorsal foot area is just about healed. 08/21/2018; patient is completed his antibiotics for his UTI. ooHe has 2 open areas on the buttocks. There is still not closed although the surface looks satisfactory. Using silver alginate ooLeft heel continues to improve using silver collagen ooThe bilateral dorsal foot areas which are at the base of his first and second toes/possible tinea pedis are actually stable on the left but worse on the right. The area on the left required debridement of necrotic surface. After debridement I obtained [name] specimen for PCR culture. ooThe right dorsal foot which is been just about healed last week is now reopened 08/28/2018; culture done on the left dorsal foot showed coag negative staph both staph epidermidis and Lugdunensis. I  think this is worthwhile initiating systemic treatment. I will use doxycycline given his long list of allergies. The area on the left heel slightly improved but still requiring debridement. ooThe large wound on the buttock is just about closed whereas the smaller one is larger. Using silver alginate in this area 09/04/2018; patient is completing his doxycycline for the left foot although this continues to be [name] very difficult wound area with very adherent necrotic debris. We are using silver alginate to all his wounds right foot left foot and the small wounds on his buttock, silver collagen on the left heel. 09/11/2018; once again this patient has intense erythema and swelling of the left forefoot. Lesser degrees of erythema in the right foot. He has [name] long list of allergies and intolerances. I will reinstitute doxycycline. 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 Using silver alginate ooLeft heel also looks better using silver collagen ooUnfortunately both the areas on his feet look worse. The area on the left first second webspace is now gone through to the plantar part  of his foot. The area on the left foot anteriorly is irritated with erythema and swelling in the forefoot. 09/25/2018 ooHis wound on the left plantar heel looks better. Using silver collagen ooThe area on the left buttock 2 small remnant areas. One is closed one is still open. Using silver alginate ooThe areas between both his first and second toes look worse. This in spite of long-standing antifungal therapy with ketoconazole and silver alginate which should have antifungal activity ooHe has small areas around his original wound on the left calf one is on the bottom of the original scar tissue and one superiorly both of these are small and superficial but again given wound history in this site this is worrisome 10/02/2018 ooLeft plantar heel continues to gradually contract using silver collagen ooLeft buttock wound is unchanged using silver alginate ooThe areas on his dorsal feet between his first and second toes bilaterally look about the same. 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 Small open area here. We have been using silver alginate 10/09/2018; ooLeft plantar heel continues to look healthy and contract. No debridement is required ooLeft buttock slightly smaller with [name] tape injury wound just below which was new this week ooDorsal feet somewhat improved I have been using clindamycin ooLeft lateral looks lower extremity the actual open area looks worse although [name] lot of this is epithelialized. I am going to change to silver collagen today He has [name] lot more swelling in the right leg although this is not pitting not red and not particularly warm there is [name] lot of spasm in the right leg usually indicative of people with paralysis of some underlying discomfort. We have reviewed his vascular status from  2017 he had [name] left greater saphenous vein ablation. I wonder about referring him back to vascular surgery if the area on the left leg continues to deteriorate. 10/16/2018 in today for follow-up and management of multiple lower extremity ulcers. His left Buttock wound is much lower smaller and almost closed completely. The wound to the left ankle has began to reopen with Epithelialization and some adherent slough. He has multiple new areas to the left foot and leg. The left dorsal foot without much improvement. Wound present between  left great webspace and 2nd toe. Erythema and edema present right leg. Right LE ultrasound obtained on 10/10/18 was negative for DVT . 10/23/2018; ooLeft buttock is closed over. Still dry macerated skin but there is no open wound. I suspect this is chronic pressure/moisture ooLeft lateral calf is quite [name] bit worse than when I saw this last. There is clearly drainage here he has macerated skin into the left plantar heel. We will change the primary dressing to alginate ooLeft dorsal foot has some improvement in overall wound area. Still using clindamycin and silver alginate ooRight dorsal foot about the same as the left using clindamycin and silver alginate ooThe erythema in the right leg has resolved. He is DVT rule out was negative ooLeft heel pressure area required debridement although the wound is smaller and the surface is health 10/26/2018 ooThe patient came back in for his nurse check today predominantly because of the drainage coming out of the left lateral leg with [name] recent reopening of his original wound on the left lateral calf. He comes in today with [name] large amount of surrounding erythema around the wound extending from the calf into the ankle and even in the area on the dorsal foot. He is not systemically unwell. He is not febrile. Nevertheless this looks like cellulitis. We have been using silver alginate to the area. I changed him to [name] regular visit and  I am going to prescribe him doxycycline. The rationale here is [name] long list of medication intolerances and [name] history of MRSA. I did not see anything that I thought would provide [name] valuable culture 10/30/2018 ooFollow-up from his appointment 4 days ago with really an extensive area of cellulitis in the left calf left lateral ankle and left dorsal foot. I put him on doxycycline. He has [name] long list of medication allergies which are true allergy reactions. 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 In any case he is [name] lot better today. The erythema especially in the anterior and lateral left calf is better. He still has left ankle erythema. He also is complaining about increasing edema in the right leg we have only been using Kerlix Coban and he has been doing the wraps at home. Finally he has [name] spotty rash on the medial part of his upper left calf which looks like folliculitis or perhaps wrap occlusion type injury. Small superficial macules not pustules 11/06/18 patient arrives today with again [name] considerable degree of erythema around the wound on the left lateral calf extending into the dorsal ankle and dorsal foot. This is [name] lot worse than when I saw this last week. He is on doxycycline really with not [name] lot of improvement. He has not been systemically unwell Wounds on the; left heel actually looks improved. Original area on the left foot and proximity to the first and second toes looks about the same. He has superficial areas on the dorsal foot, anterior calf and then the reopening of his original wound on the left lateral calf which looks about the same ooThe only area he has on the right is the dorsal webspace first and second which is smaller. ooHe has [name] large area of dry erythematous skin on the left buttock small open area here. 11/13/2018; the patient arrives in much better condition. The erythema around the wound on the left  lateral calf is [name] lot better. Not sure whether this was the clindamycin or the TCA and  ketoconazole or just in the improvement in edema control [stasis dermatitis]. In any case this is [name] lot better. The area on the left heel is very small and just about resolved using silver collagen we have been using silver alginate to the areas on his dorsal feet 11/20/2018; his wounds include the left lateral calf, left heel, dorsal aspects of both feet just proximal to the first second webspace. He is stable to slightly improved. I did not think any changes to his dressings were going to be necessary 11/27/2018 he has [name] reopening on the left buttock which is surrounded by what looks like tinea or perhaps some other form of dermatitis. The area on the left dorsal foot has some erythema around it I have marked this area but I am not sure whether this is cellulitis or not. Left heel is not closed. Left calf the reopening is really slightly longer and probably worse 1/13; in general things look better and smaller except for the left dorsal foot. Area on the left heel is just about closed, left buttock looks better only [name] small wound remains in the skin looks better [using Lotrisone] 1/20; the area on the left heel only has [name] few remaining open areas here. Left lateral calf about the same in terms of size, left dorsal foot slightly larger right lateral foot still not closed. The area on the left buttock has no open wound and the surrounding skin looks [name] lot better 1/27; the area on the left heel is closed. Left lateral calf better but still requiring extensive debridements. The area on his left buttock is closed. He still has the open areas on the left dorsal foot which is slightly smaller in the right foot which is slightly expanded. We have been using Iodoflex on these areas as well 2/3; left heel is closed. Left lateral calf still requiring debridement using Iodoflex there is no open area on his left buttock however  he has dry scaly skin over [name] large area of this. Not really responding well to the Lotrisone. Finally the areas on his dorsal feet at the level of the first second webspace are slightly smaller on the right and about the same on the left. Both of these vigorously debrided with Anasept and gauze 2/10; left heel remains closed he has dry erythematous skin over the left buttock but there is no open wound here. Left lateral leg has come in and with. Still requiring debridement we have been using Iodoflex here. Finally the area on the left dorsal foot and right dorsal foot are really about the same extremely dry callused fissured areas. He does not yet have [name] dermatology appointment 2/17; left heel remains closed. He has [name] new open area on the left buttock. The area on the left lateral calf is bigger longer and still covered in necrotic debris. No major change in his foot areas bilaterally. I am awaiting for [name] dermatologist to look on this. We have been using ketoconazole I do not know that this is been doing any good at all. 2/24; left heel remains closed. The left buttock wound that was new reopening last week looks better. The left lateral calf appears better also although still requires debridement. The major area on his foot is the left first second also requiring debridement. We have been putting Prisma on all wounds. I do not believe that the ketoconazole has done too much good for his feet. He will use Lotrisone I am going to give him [name]  2-week course of terbinafine. We still do not have [name] dermatology appointment 3/2 left heel remains closed however there is skin over bone in this area I pointed this out to him today. The left buttock wound is epithelialized but still does not look completely stable. The area on the left leg required debridement were using silver collagen here. With regards to his feet we changed to Lotrisone last week and silver alginate. 3/9; left heel remains closed. Left  buttock remains closed. The area on the right foot is essentially closed. The left foot remains unchanged. Slightly smaller on the left lateral calf. Using silver collagen to both of these areas 3/16-Left heel remains closed. Area on right foot is closed. Left lateral calf above the lateral malleolus open wound requiring debridement with easy bleeding. Left dorsal wound proximal to first toe also debrided. Left ischial area open new. Patient has been using Prisma with wrapping every 3 days. Dermatology appointment is apparently tomorrow.Patient has completed his terbinafine 2-week course with some apparent improvement according to him, there is still flaking and dry skin in his foot on the left 3/23; area on the right foot is reopened. The area on the left anterior foot is about the same still [name] very necrotic adherent surface. He still has the area on the left leg and reopening is on the left buttock. He apparently saw dermatology although I do not have [name] note. According to the patient who is usually fairly well informed they did not have any good ideas. Put him on oral terbinafine which she is been on before. 3/30; using silver collagen to all wounds. Apparently his dermatologist put him on doxycycline and rifampin presumably some culture grew staph. I do not have this result. He remains on terbinafine although I have used terbinafine on him before 4/6; patient has had [name] fairly substantial reopening on the right foot between the first and second toes. He is finished his terbinafine and I believe is on doxycycline and rifampin still as prescribed by dermatology. We have been using silver collagen to all his wounds although the patient reports that he thinks silver alginate does better on the wounds on his buttock. 4/13; the area on his left lateral calf about the same size but it did not require debridement. ooLeft dorsal foot just proximal to the webspace between the first and second toes is about  the same. Still nonviable surface. I note some superficial bronze discoloration of the dorsal part of his foot ooRight dorsal foot just proximal to the first and second toes also looks about the same. I still think there may be the same discoloration I noted above on the left ooLeft buttock wound looks about the same 4/20; left lateral calf appears to be gradually contracting using silver collagen. 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 The left dorsal foot wound is debrided of tightly adherent necrotic debris and really cleans up quite nicely. The right area is worse with expansion. I did not debride this it is now over the base of the second toe ooThe area on his left buttock is smaller no debridement is required using silver collagen 5/4; left calf continues to make good progress. ooHe arrives with erythema around the wounds on his dorsal foot which even extends to the plantar aspect. Very concerning for coexistent infection. He is finished the erythromycin I gave him for possible erythrasma this does not seem to have helped. ooThe area on the left foot is  about the same base of the dorsal toes ooIs area on the buttock looks improved on the left 5/11; left calf and left buttock continued to make good progress. Left foot is about the same to slightly improved. ooMajor problem is on the right foot. He has not had an x-ray. Deep tissue culture I did last week showed both Enterobacter and E. coli. I did not change the doxycycline I put him on empirically although neither 1 of these were plated to doxycycline. He arrives today with the erythema looking worse on both the dorsal and plantar foot. Macerated skin on the bottom of the foot. he has not been systemically unwell 5/18-Patient returns at 1 week, left calf wound appears to be making some progress, left buttock wound appears slightly worse than last time, left foot  wound looks slightly better, right foot redness is marginally better. X-ray of both feet show no air or evidence of osteomyelitis. Patient is finished his Omnicef and terbinafine. He continues to have macerated skin on the bottom of the left foot as well as right 5/26; left calf wound is better, left buttock wound appears to have multiple small superficial open areas with surrounding macerated skin. X-rays that I did last time showed no evidence of osteomyelitis in either foot. He is finished cefdinir and doxycycline. I do not think that he was on terbinafine. He continues to have [name] large superficial open area on the right foot anterior dorsal and slightly between the first and second toes. I did send him to dermatology 2 months ago or so wondering about whether they would do [name] fungal scraping. I do not believe they did but did do [name] culture. We have been using silver alginate to the toe areas, he has been using antifungals at home topically either ketoconazole or Lotrisone. We are using silver collagen on the left foot, silver alginate on the right, silver collagen on the left lateral leg and silver alginate on the left buttock 6/1; left buttock area is healed. We have the left dorsal foot, left lateral leg and right dorsal foot. We are using silver alginate to the areas on both feet and silver collagen to the area on his left lateral calf 6/8; the left buttock apparently reopened late last week. He is not really sure how this happened. He is tolerating the terbinafine. Using silver alginate to all wounds 6/15; left buttock wound is larger than last week but still superficial. ooCame in the clinic today with [name] report of purulence from the left lateral leg I did not identify any infection ooBoth areas on his dorsal feet appear to be better. He is tolerating the terbinafine. Using silver alginate to all wounds 6/22; left buttock is about the same this week, left calf quite [name] bit better. His left foot  is about the same however he comes in with erythema and warmth in the right forefoot once again. Culture that I gave him in the beginning of May showed Enterobacter and E. coli. I gave him doxycycline and things seem to improve although neither 1 of these organisms was specifically plated. 6/29; left buttock is larger and dry this week. Left lateral calf looks to me to be improved. Left dorsal foot also somewhat improved right foot completely unchanged. The erythema on the right foot is still present. He is completing the Ceftin dinner that I gave him empirically [see discussion above.) 7/6 - All wounds look to be stable and perhaps improved, the left buttock wound is slightly smaller,  per patient bleeds easily, completed ceftin, the right foot redness is less, he is on terbinafine 7/13; left buttock wound about the same perhaps slightly narrower. Area on the left lateral leg continues to narrow. Left dorsal foot slightly smaller right foot about the same. We are using silver alginate on the right foot and Hydrofera Blue to the areas on the left. Unna boot on the left 2 layer compression on the right 7/20; left buttock wound absolutely the same. Area on lateral leg continues to get better. Left dorsal foot require debridement as did the right no major change in the 7/27; left buttock wound the same size necrotic debris over the surface. The area on the lateral leg is closed once again. His left foot looks better right foot about the same although there is some involvement now of the posterior first second toe area. He is still on terbinafine which I have given him for [name] month, not certain [name] centimeter major change 06/25/19-All wounds appear to be slightly improved according to report, left buttock wound looks clean, both foot wounds have minimal to no debris the right dorsal foot has minimal slough. We are using Hydrofera Blue to the left and silver alginate to the right foot and ischial  wound. 8/10-Wounds all appear to be around the same, the right forefoot distal part has some redness which was not there before, however the wound looks clean and small. Ischial wound looks about the same with no changes 8/17; his wound on the left lateral calf which was his original chronic venous insufficiency wound remains closed. Since I last saw him the areas on the left dorsal foot right dorsal foot generally appear better but require debridement. The area on his left initial tuberosity appears somewhat larger to me perhaps hyper granulated and bleeds very easily. We have been using Hydrofera Blue to the left dorsal foot and silver alginate to everything else 8/24; left lateral calf remains closed. The areas on his dorsal feet on the webspace of the first and second toes bilaterally both look better. The area on the left buttock which is the pressure ulcer stage II slightly smaller. I change the dressing to Hydrofera Blue to all areas 8/31; left lateral calf remains closed. The area on his dorsal feet bilaterally look better. Using Hydrofera Blue. Still requiring debridement on the left foot. No change in the left buttock pressure ulcers however 9/14; left lateral calf remains closed. Dorsal feet look quite [name] bit better than 2 weeks ago. Flaking dry skin also [name] lot better with the ammonium lactate I gave him 2 weeks ago. The area on the left buttock is improved. He states that his Roho cushion developed [name] leak and he is getting [name] new one, in the interim he is offloading this vigorously 9/21; left calf remains closed. Left heel which was [name] possible DTI looks better this week. He had macerated tissue around the left dorsal foot right foot looks satisfactory and improved left buttock wound. I changed his dressings to his feet to silver alginate bilaterally. Continuing Hydrofera Blue on the left buttock. 9/28 left calf remains closed. Left heel did not develop anything [possible DTI] dry flaking  skin on the left dorsal foot. Right foot looks satisfactory. Improved left buttock wound. We are using silver alginate on his feet Hydrofera Blue on the buttock. I have asked him to go back to the Lotrisone on his feet including the wounds and surrounding areas 10/5; left calf remains closed. The areas on  the left and right feet about the same. [name] lot of this is epithelialized however debris over the remaining open areas. He is using Lotrisone and silver alginate. The area on the left buttock using Hydrofera Blue 10/26. Patient has been out for 3 weeks secondary to Covid concerns. He tested negative but I think his wife tested positive. He comes in today with the left foot substantially worse, right foot about the same. Even more concerning he states that the area on his left buttock closed over but then reopened and is considerably deeper in one aspect than it was before [stage III wound] 11/2; left foot really about the same as last week. Quarter sized wound on the dorsal foot just proximal to the first second toes. Surrounding erythema with areas of denuded epithelium. This is not really much different looking. Did not look like cellulitis this time however. ooRight foot area about the same.. We have been using silver alginate alginate on his toes ooLeft buttock still substantial irritated skin around the wound which I think looks somewhat better. We have been using Hydrofera Blue here. 11/9; left foot larger than last week and [name] very necrotic surface. Right foot I think is about the same perhaps slightly smaller. Debris around the circumference also addressed. Unfortunately on the left buttock there is been [name] decline. Satellite lesions below the major wound distally and now [name] an additional one posteriorly we have been using Hydrofera Blue but I think this is [name] pressure issue 11/16; left foot ulcer dorsally again [name] very adherent necrotic surface. Right foot is about the same. Not much change in  the pressure ulcer on his left buttock. 11/30; left foot ulcer dorsally basically the same as when I saw him 2 weeks ago. Very adherent fibrinous debris on the wound surface. Patient reports [name] lot of drainage as well. 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 We have been using Iodoflex, patient changed back to alginate because of the drainage. Area on his right dorsal foot really looks benign with [name] healthier surface certainly [name] lot better than on the left. Left buttock wounds all improved using Hydrofera Blue 12/7; left dorsal foot again no improvement. Tightly adherent debris. PCR culture I did last week only showed likely skin contaminant. I have gone ahead and done [name] punch biopsy of this which is about the last thing in terms of investigations I can think to do. He has known venous insufficiency and venous hypertension and this could be the issue here. The area on the right foot is about the same left buttock slightly worse according to our intake nurse secondary to [facility] Blue sticking to the wound 12/14; biopsy of the left foot that I did last time showed changes that could be related to wound healing/chronic stasis dermatitis phenomenon no neoplasm. We have been using silver alginate to both feet. I change the one on the left today to Sorbact and silver alginate to his other 2 wounds 12/28; the patient arrives with the following problems; ooMajor issue is the dorsal left foot which continues to be [name] larger deeper wound area. Still with [name] completely nonviable surface ooParadoxically the area mirror image on the right on the right dorsal foot appears to be getting better. ooHe had some loss of dry denuded skin from the lower part of his original wound on the left lateral calf. Some of this area looked [name] little vulnerable and for this reason we put him in  wrap that on this side this week ooThe area on his left buttock is larger. He still  has the erythematous circular area which I think is [name] combination of pressure, sweat. This does not look like cellulitis or fungal dermatitis 11/26/2019; -Dorsal left foot large open wound with depth. Still debris over the surface. Using Sorbact ooThe area on the dorsal right foot paradoxically has closed over [facility] has [name] reopening on the left ankle laterally at the base of his original wound that extended up into the calf. This appears clean. ooThe left buttock wound is smaller but with very adherent necrotic debris over the surface. We have been using silver alginate here as well The patient had arterial studies done in 2017. He had biphasic waveforms at the dorsalis pedis and posterior tibial bilaterally. ABI in the left was 1.17. Digit waveforms were dampened. He has slight spasticity in the great toes I do not think [name] TBI would be possible 1/11; the patient comes in today with [name] sizable reopening between the first and second toes on the right. This is not exactly in the same location where we have been treating wounds previously. According to our intake nurse this was actually fairly deep but 0.6 cm. The area on the left dorsal foot looks about the same the surface is somewhat cleaner using Sorbact, his MRI is in 2 days. We have not managed yet to get arterial studies. The new reopening on the left lateral calf looks somewhat better using alginate. The left buttock wound is about the same using alginate 1/18; the patient had his ARTERIAL studies which were quite normal. ABI in the right at 1.13 with triphasic/biphasic waveforms on the left ABI 1.06 again with triphasic/biphasic waveforms. It would not have been possible to have done [name] toe brachial index because of spasticity. We have been using Sorbac to the left foot alginate to the rest of his wounds on the right foot left lateral calf and left buttock 1/25; arrives in clinic with erythema and swelling of the left forefoot worse over the first  MTP area. This extends laterally dorsally and but also posteriorly. Still has an area on the left lateral part of the lower part of his calf wound it is eschared and clearly not closed. ooArea on the left buttock still with surrounding irritation and erythema. ooRight foot surface wound dorsally. The area between the right and first and second toes appears better. 2/1; ooThe left foot wound is about the same. Erythema slightly better I gave him [name] week of doxycycline empirically ooRight foot wound is more extensive extending between the toes to the plantar surface ooLeft lateral calf really no open surface on the inferior part of his original wound however the entire area still looks vulnerable ooAbsolutely no improvement in the left buttock wound required debridement. 2/8; the left foot is about the same. Erythema is slightly improved I gave him clindamycin last week. ooRight foot looks better he is using Lotrimin and silver alginate ooHe has [name] breakdown in the left lateral calf. Denuded epithelium which I have removed ooLeft buttock about the same were using Hydrofera Blue 2/15; left foot is about the same there is less surrounding erythema. Surface still has tightly adherent debris which I have debriding however not making any progress ooRight foot has [name] substantial wound on the medial right second toe between the first and second webspace. ooStill an open area on the left lateral calf distal area. ooButtock wound is about the same 2/22; left  foot is about the same less surrounding erythema. Surface has adherent debris. Polymen Ag Right foot area significant wound between the first and second toes. We have been using silver alginate here Left lateral leg polymen Ag at the base of his original venous insufficiency wound ooLeft buttock some improvement here 3/1; ooRight foot is deteriorating in the first second toe webspace. Larger and more substantial. We have been using silver  alginate. ooLeft dorsal foot about the same markedly adherent surface debris using PolyMem Ag ooLeft lateral calf surface debris using PolyMem AG ooLeft buttock is improved again using PolyMem Ag. ooHe is completing his terbinafine. The erythema in the foot seems better. He has been on this for 2 weeks 3/8; no improvement in any wound area in fact he has [name] small open area on the dorsal midfoot which is new this week. He has not gotten his foot x-rays yet 3/15; his x-rays were both negative for osteomyelitis of both feet. No major change in any of his wounds on the extremities however his buttock wounds are better. We have been using polymen on the buttocks, left lower leg. Iodoflex on the left foot and silver alginate on the right 3/22; arrives in clinic today with the 2 major issues are the improvement in the left dorsal foot wound which for once actually looks healthy with [name] nice healthy wound surface without debridement. Using Iodoflex here. Unfortunately on the left lateral calf which is in the distal part of his original wound he came to the clinic here for there was purulent drainage noted some increased breakdown scattered around the original area and [name] small area proximally. We we are using polymen here will change to silver alginate today. His buttock wound on the left is better and I think the area on the right first second toe webspace is also improved 3/29; left dorsal foot looks better. Using Iodoflex. Left ankle culture from deterioration last time grew E. coli, Enterobacter and Enterococcus. I will give him [name] course of cefdinir although that will not cover Enterococcus. The area on the right foot in the webspace of the first and second toe lateral first toe looks better. The area on his buttock is about healed Vascular appointment is on April 21. This is to look at his venous system vis--vis continued breakdown of the wounds on the left including the left lateral leg and left  dorsal foot he. He has had previous ablations on this side 4/5; the area between the right first and second toes lateral aspect of the first toe looks better. Dorsal aspect of the left first toe on the left foot also improved. Unfortunately the left lateral lower leg is larger and there is [name] second satellite wound superiorly. The usual superficial abrasions on the left buttock overall better but certainly not closed 4/12; the area between the right first and second toes is improved. Dorsal aspect of the left foot also slightly smaller with [name] vibrant healthy looking surface. No real change in the left lateral leg and the left buttock wound is healed He has an unaffordable co-pay for Aplig[employer]. Appointment with vein and vascular with regards to the left leg venous part of the circulation is on 4/21 4/19; we continue to see improvement in all wound areas. Although this is minor. He has his vascular appointment on 4/21. The area on the left buttock has not reopened although right in the center of this area the skin looks somewhat threatened 4/26; the left buttock is unfortunately reopened.  In general his left dorsal foot has [name] healthy surface and looks somewhat smaller although it was not measured as such. The area between his first and second toe webspace on the right as [name] small wound against the first toe. The patient saw vascular surgery. The real question I was asking was about the small saphenous vein on the left. He has previously ablated left greater saphenous vein. Nothing further was commented on on the left. Right greater saphenous vein without reflux at the saphenofemoral junction or proximal thigh there was no indication for ablation of the right greater saphenous vein duplex was negative for DVT bilaterally. They did not think there was anything from [name] vascular surgery point of view that could be offered. They ABIs within normal limits 5/3; only small open area on the left buttock. The area on  the left lateral leg which was his original venous reflux is now 2 wounds both which look clean. We are using Iodoflex on the left dorsal foot which looks healthy and smaller. He is down to [name] very tiny area between the right first and second toes, using silver alginate 5/10; all of his wounds appear better. We have much better edema control in 4 layer compression on the left. This may be the factor that is allowing the left foot and left lateral calf to heal. He has external compression garments at home 04/14/20-All of his wounds are progressing well, the left forefoot is practically closed, left ischium appears to be about the same, right toe webspace is also smaller. The left lateral leg is about the same, continue using Hydrofera Blue to this, silver alginate to the ischium, Iodoflex to the toe space on the right 6/7; most of his wounds outside of the left buttock are doing well. The area on the left lateral calf and left dorsal foot are smaller. The area on the right foot in between the first and second toe webspace is barely visible although he still says there is some drainage here is the only reason I did not heal this out. ooUnfortunately the area on the left buttock almost looks like he has [name] skin tear from tape. He has open wound and then [name] large flap of skin that we are trying to get adherence over an area just next to the remaining wound 6/21; 2 week follow-up. I believe is been here for nurse visits. Miraculously the area between his first and second toes on the left dorsal foot is closed over. Still open on the right first second web space. The left lateral calf has 2 open areas. Distally this is more superficial. The proximal area had [name] little more depth and required debridement of adherent necrotic material. His buttock wound is actually larger we have been using silver alginate here 6/28; the patient's area on the left foot remains closed. Still open wet area between the first and  second toes on the right and also extending into the plantar aspect. We have been using silver alginate in this location. He has 2 areas on the left lower leg part of his original long wounds which I think are better. We have been using Hydrofera Blue here. Hydrofera Blue to the left buttock which is stable 7/12; left foot remains closed. Left ankle is closed. May be [name] small area between his right first and second toes the only truly open area is on the left buttock. We have been using Hydrofera Blue here 7/19; patient arrives with marked deterioration especially in the  left foot and ankle. We did not put him in [name] compression wrap on the left last week in fact he wore his juxta lite stockings on either side although he does not have an underlying stocking. He has [name] reopening on the left dorsal foot, left lateral ankle and [name] new area on the right dorsal ankle. More worrisome is the degree of erythema on the left foot extending on the lateral foot into the lateral lower leg on the left 7/26; the patient had erythema and drainage from the lateral left ankle last week. Culture of this grew MRSA resistant to doxycycline and clindamycin which are the 2 antibiotics we usually use with this patient who has multiple antibiotic allergies including linezolid, trimethoprim sulfamethoxazole. I had give him an empiric doxycycline and he comes in the area certainly looks somewhat better although it is blotchy in his lower leg. He has not been systemically unwell. He has had areas on the left dorsal foot which is [name] reopening, chronic wounds on the left lateral ankle. Both of these I think are secondary to chronic venous insufficiency. The area between his first and second toes is closed as far as I can tell. He had [name] new wrap injury on the right dorsal ankle last week. Finally he has an area on the left buttock. We have been using silver alginate to everything except the left buttock we are using Hydrofera  Blue 06/30/20-Patient returns at 1 week, has been given [name] sample dose pack of NUZYRA which is [name] tetracycline derivative [omadacycline], patient has completed those, we have been using silver alginate to almost all the wounds except the left ischium where we are using Hydrofera Blue all of them look better 8/16; since I last saw the patient he has been doing well. The area on the left buttock, left lateral ankle and left foot are all closed today. He has completed the [place] I gave him last time and tolerated this well. He still has open areas on the right dorsal ankle and in the right first second toe area which we are using silver alginate. 8/23; we put him in his bilateral external compression stockings last week as he did not have anything open on either leg except for concerning area between the right first and second toe. He comes in today with an area on the left dorsal foot slightly more proximal than the original wound, the left lateral foot but this is actually [name] continuation of the area he had on the left lateral ankle from last time. As well he is opened up on the left buttock again. 8/30; comes in today with things looking [name] lot better. The area on the left lower ankle has closed down as has the left foot but with eschar in both areas. The area on the dorsal right ankle is also epithelialized. Very little remaining of the left buttock wound. We have been using silver alginate on all wound areas 9/13; the area in the first second toe webspace on the right has fully epithelialized. He still has some vulnerable epithelium on the right and the ankle and the dorsal foot. He notes weeping. He is using his juxta lite stocking. On the left again the left dorsal foot is closed left lateral ankle is closed. We went to the juxta lite stocking here as well. ooStill vulnerable in the left buttock although only 2 small open areas remain here 9/27; 2-week follow-up. We did not look at his left leg but the  patient  says everything is closed. He is [name] bit disturbed by the amount of edema in his left foot he is using juxta lite stockings but asking about over the toes stockings which would be 30/40, will talk to him next time. According to him there is no open wound on either the left foot or the left ankle/calf He has an open area on the dorsal right calf which I initially point [name] wrap injury. He has superficial remaining wound on the left ischial tuberosity been using silver alginate although he says this sticks to the wound 10/5; we gave him 2-week follow-up but he called yesterday expressing some concerns about his right foot right ankle and the left buttock. He came in early. There is still no open areas on the left leg and that still in his juxta lite stocking 10/11; he only has 1 small area on the left buttock that remains measuring millimeters 1 mm. Still has the same irritated skin in this area. We recommended zinc oxide when this eventually closes and pressure relief is meticulously is he can do this. He still has an area on the dorsal part of his right first through third toes which is [name] bit irritated and still open and on the dorsal ankle near the crease of the ankle. We have been using silver alginate and using his own stocking. He has nothing open on the left leg or foot 10/25; 2-week follow-up. Not nearly as good on the left buttock as I was hoping. For open areas with 5 looking threatened small. He has the erythematous irritated chronic skin in this area. oo1 area on the right dorsal ankle. He reports this area bleeds easily ooRight dorsal foot just proximal to the base of his toes ooWe have been using silver alginate. 11/8; 2-week follow-up. Left buttock is about the same although I do not think the wounds are in the same location we have been using silver alginate. I have asked him to use zinc oxide on the skin around the wounds. ooHe still has [name] small area on the right dorsal ankle  he reports this bleeds easily ooRight dorsal foot just proximal to the base of the toes does not have anything open although the skin is very dry and scaly ooHe has [name] new opening on the nailbed of the left great toe. Nothing on the left ankle 11/29; 3-week follow-up. Left buttock has 2 open areas. And washing of these wounds today started bleeding easily. Suggesting very friable tissue. We have been using silver alginate. Right dorsal ankle which I thought was initially [name] wrap injury we have been using silver alginate. Nothing open between the toes that I can see. He states the area on the left dorsal toe nailbed healed after the last visit in 2 or 3 days 12/13; 3-week follow-up. His left buttock now has 3 open areas but the original 2 areas are smaller using polymen here. Surrounding skin looks better. The right dorsal ankle is closed. He has [name] small opening on the right dorsal foot at the level of the third toe. In general the skin looks better here. He is wearing his juxta lite stocking on the left leg says there is nothing open 11/24/2020; 3 weeks follow-up. His left buttock still has the 3 open areas. We have been using polymen but due to lack of response he changed to [facility] area. Surrounding skin is dry erythematous and irritated looking. There is no evidence of infection either bacterial or fungal however there  is loss of surface epithelium ooHe still has very dry skin in his foot causing irritation and erythema on the dorsal part of his toes. This is not responded to prolonged courses of antifungal simply looks dry and irritated 1/24; left buttock area still looks about the same he was unable to find the triad ointment that we had suggested. The area on the right lower leg just above the dorsal ankle has reopened and the areas on the right foot between the first second and second third toes and scaling on the bottom of the foot has been about the same for quite some time now. been  using silver alginate to all wound areas 2/7; left buttock wound looked quite good although not much smaller in terms of surface area surrounding skin looks better. Only [name] few dry flaking areas on the right foot in between the first and second toes the skin generally looks better here [ammonium lactate]. Finally the area on the right dorsal ankle is closed 2/21; ooThere is no open area on the right foot even between the right first and second toe. Skin around this area dorsally and plantar aspects look better. ooHe has [name] reopening of the area on the right ankle just above the crease of the ankle dorsally. I continue to think that this is probably friction from spasms may be even this time with his stocking under the compression stockings. ooWounds on his left buttock look about the same there [name] couple of areas that have reopened. He has [name] total square area of loss of epithelialization. This does not look like infection it looks like [name] contact dermatitis but I just cannot determine to what 3/14; there is nothing on the right foot between the first and second toes this was carefully inspected under illumination. Some chronic irritation on the dorsal part of his foot from toes 1-3 at the base. Nothing really open here substantially. Still has an area on the right foot/ankle that is actually larger and hyper granulated. His buttock area on the left is just about closed however he has chronic inflammation with loss of the surface epithelial layer 3/28; 2-week follow-up. In clinic today with [name] new wound on the left anterior mid tibia. Says this happened about 2 weeks ago. He is not really sure how wonders about the spasticity of his legs at night whether that could have caused this other than that he does not have [name] good idea. He has been using topical antibiotics and silver alginate. The area on his right dorsal ankle seems somewhat better. ooFinally everything on his left buttock is closed. 4/11;  2-week follow-up. All of his wounds are better except for the area over the ischium and left buttock which have opened up widely again. At least part of this is covered in necrotic fibrinous material another part had rolled nonviable skin. The area on the right ankle, left anterior mid tibia are both [name] lot better. He had no open wounds on either foot including the areas between the first and second toes 4/25; patient presents for 2-week follow-up. He states that the wounds are overall stable. He has no complaints today and states he is using Hydrofera Blue to open wounds. 5/9; have not seen this man in over [name] month. For my memory he has open areas on the left mid tibia and right ankle. T oday he has new open area on the right dorsal foot which we have not had [name] problem with recently. He has the sustained  area on the left buttock He is also changed his insurance at the beginning of the year [place]. We will need prior authorizations for debridement 5/23; patient presents for 2-week follow-up. He has prior authorizations for debridement. He denies any issues in the past 2 weeks with his wound care. He has been using Hydrofera Blue to all the wounds. He does report [name] circular rash to the upper left leg that is new. He denies acute signs of infection. 6/6; 2-week follow-up. The patient has open wounds on the left buttock which are worse than the last time I saw this about [name] month ago. He also has [name] new area to me on the left anterior mid tibia with some surrounding erythema. The area on the dorsal ankle on the right is closed but I think this will be [name] friction injury every time this area is exposed to either our wraps or his compression stockings caused by unrelenting spasms in this leg. 6/20; 2-week follow-up. oo The patient has open wounds on the left buttock which is about the same. Using [facility] here. - The left mid tibia has [name] static amount of surrounding erythema. Also [name] raised area  in the center. We have been using Hydrofera Blue here. ooooFinally he has broken down in his dorsal right foot extending between the first and second toes and going to the base of the first and second toe webspace. I have previously assumed that this was severe venous hypertension 7/5; 2-week follow-up oo The left buttock wound actually looks better. We are using Hydrofera Blue. He has extensive skin irritation around this area and I have not really been able to get that any better. I have tried Lotrisone i.e. antifungals and steroids. More most recently we have just been using Coloplast really looks about the same. oo The left mid tibia which was new last week culture to have very resistant staph aureus. Not only methicillin-resistant but doxycycline resistant. The patient has [name] plethora of antibiotic allergies including sulfa, linezolid. I used topical bacitracin on this but he has not started this yet. oo In addition he has an expanding area of erythema with [name] wound on the dorsal right foot. I did [name] deep tissue culture of this area today 7/12; oo Left buttock area actually looks better surrounding skin also looks less irritated. oo Left mid tibia looks about the same. He is using bacitracin this is not worse oo Right dorsal foot looks about the same as well. oo The left first toe also looks about the same 7/19; left buttock wound continues to improve in terms of open areas oo Left mid tibia is still concerning amount of swelling he is using bacitracin oo Dorsal left first toe somewhat smaller oo Right dorsal foot somewhat smaller 7/25; left buttock wound actually continues to improve oo Left mid tibia area has less swelling. I gave him all my samples of new Nuzyra. This seems to have helped although the wound is still open it. His abrasion closed by here oo Left dorsal great toe really no better. Still [name] very nonviable surface oo Right dorsal foot perhaps some better. oo We  have been using bacitracin and silver nitrate to the areas on his lower legs and Hydrofera Blue to the area on the buttock. 8/16 oo Disappointed that his left buttock wound is actually more substantial. Apparently during the last nurse visit these were both very small. He has continued irritation to [name] large area of skin on his buttock.  I have never been able to totally explain this although I think it some combination of the way he sits, pressure, moisture. He is not incontinent enough to contribute to this. oo Left dorsal great toe still fibrinous debris on the surface that I have debrided today oo Large area across the dorsal right toes. oo The area on the left anterior mid tibia has less swelling. He completed the [place]. This does not look infected although the tissue is still fried 8/30; 2-week follow-up. oo Left buttock areas not improved. We used Hydrofera Blue on this. Weeping wet with the surrounding erythema that I have not been able to control even with Lotrisone and topical Coloplast oo Left dorsal great toe looks about the same oo More substantial area again at the base of his toes on the left which is new this week. oo Area across the dorsal right toes looks improved oo The left anterior mid tibia looks like it is trying to close 9/13; 2-week follow-up. Using silver alginate on all of his wounds. The left dorsal foot does not look any better. He has the area on the dorsal toe and also the areas at the base of all of his toes 1 through 3. On the right foot he has [name] similar pattern in [name] similar area. He has the area on his left mid tibia that looks fairly healthy. Finally the area on his left buttock looks somewhat bette 9/20; culture I did of the left foot which was [name] deep tissue culture last time showed E. coli he has erythema around this wound. Still [name] completely necrotic surface. His right dorsal foot looks about the same. He has [name] very friable surface to the left anterior  mid tibia. Both buttock wounds look better. We have been using silver alginate to all wounds 10/4; he has completed the cephalexin that I gave him last time for the left foot. He is using topical gentamicin under silver alginate silver alginate being applied to all the wounds. Unfortunately all the wounds look irritated on his dorsal right foot dorsal left foot left mid tibia. I wonder if this could be [name] silver allergy. I am going to change him to [facility] on the lower extremity. The skin on the left buttock and left posterior thigh still flaking dry and irritated. This has continued no matter what I have applied topically to this. He has [name] solitary open wound which by itself does not look too bad however the entire area of surrounding skin does not change no matter what we have applied here 10/18; the area on the left dorsal foot and right dorsal foot both look better. The area on the right extends into the plantar but not between his toes. We have been using silver alginate. He still has [name] rectangular erythematous area around the area on the left tibia. The wound itself is very small. Finally everything on his left buttock looks [name] little larger the skin is erythematous 11/15; patient comes in with the left dorsal and right dorsal foot distally looking somewhat better. Still nonviable surface on the left foot which required debridement. He still has the area on the left anterior mid tibia although this looks somewhat better. He has [name] new area on the right lateral lower leg just above the ankle. Finally his left buttock looks terrible with multiple superficial open wounds geometric square shaped area of chronic erythema which I have not been able to sort through 11/29; right dorsal foot and left dorsal  foot both look somewhat better. No debridement required. He has the fragile area on the left anterior mid tibia this looks and continues to look somewhat better. Right lateral lower leg just above  the ankle we identified last time also looks better. In general the area on his left buttock looks improved. We are using Hydrofera Blue to all wound areas 12/13; right dorsal foot looks better. The area on the right lateral leg is healed. Left dorsal foot has 2 open areas both of which require debridement. The fragile area on the left anterior mid tibial looks better. Smaller area on his buttocks. Were using Hydrofera Blue 1/10; patient comes in with everything looking slightly larger and/or worse. This includes his left buttock, reopening of the left mid tibia, larger areas on the left dorsal foot and what looks to be [name] cellulitis on the right dorsal and plantar foot. We have been using Hydrofera Blue on all wounds. 1/17; right dorsal foot distally looks better today. The left foot has 2 open wounds that are about the same surrounding erythema. 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 No malignancy they did not stain for fungus 1/24; his right distal foot is not closed dry and scaly but the wound looks like it is contracting. I did not debride anything here. Problem on his left dorsal foot with expanding erythema. Apparently there were problems last week getting the Elesa Hacker however it is now available at the [employer] [facility] but [name] week later. He is using ketoconazole and Coloplast to the left buttock along with Hydrofera Blue this actually looks quite [name] bit better today. 1/31; right dorsal foot again is dry and scaly but looking to contract. He has been using [name] moisturizer on his feet at my request but he is not sure which 1. The left dorsal foot wounds look about the same there is erythema here that I marked last week however after course of Nuzyra it certainly is not any better but not any worse either. Finally on his left buttock the skin continues to look  better he has the original wound but [name] new substantial area towards the gluteal cleft. Almost like [name] skin tear. I used scissors to remove skin and subcutaneous tissue here silver nitrate and direct pressure 2/7; right dorsal foot. This does not look too much different from last week. Some erythema skin dry and scaly. No debridement. Left dorsal great toe again still not much improvement. I did remove flaking dry skin and callus from around the edge. Finally on his left buttock. The skin is somewhat better in the periwound. Surface wounds are superficial somewhat better than last week. 01/26/2022: Is [name] little bit of [name] mystery as to why his wounds fail to respond to treatments and actually seem to get worse. This is my first encounter with this patient. He was previously followed by Dr. Dellia Nims. Based upon my review of the chart, it seems that there is [name] little bit of [name] mystery as to why his wounds do not respond as anticipated to the interventions applied and sometimes even get worse. Biopsies have been performed and he was seen by dermatology in [place], but that did not shed any light on the matter. T oday, his gluteal wound is larger, with substantial drainage, rather malodorous. The food wounds are not terrible, but he has [name] lot of callus and scaly skin around these.  He is currently getting silver alginate on the gluteal wound, with idodoflex to the feet. He is using lotrisone on his legs for the dry, scaly skin. 02/09/2022: There has really been no change to any of his wounds. The gluteal wound less drainage and odor, but remains about the same size, the periwound skin remains oddly scaly. His lower extremity wounds also appear roughly the same size. They continue to accumulate [name] small amount of slough. The periwound on his feet and ankle wounds has dry eschar and loose dead skin. We have been using silver alginate on the gluteal wound and Iodoflex on his feet and ankle wounds. T the periwound around  his gluteal lesion and Lotrisone on his feet and legs. o 02/23/2022: The right plantar foot wound is closed. The gluteal site looks small but has continued to produce hypertrophic granulation tissue. The foot wounds all look about the same on the dorsal surface of the right foot; on the left, there is only [name] small open area at the site of where his left great toenail would have been. 03/16/2022: The right ankle wound is healed. The right dorsal foot wound is about the same. The left dorsal foot wound is quite [name] bit smaller and the ischial wound is nearly closed. 03/30/2022: The right ankle wound reopened. Both dorsal foot wounds are quite [name] bit smaller. Unfortunately, he appears to have sheared part of his ischial wound open further, perhaps during [name] transfer. 04/13/2022: The right ankle wound has hypertrophic granulation tissue present. The dorsal foot wounds continue to decrease in size. The ischial wound looks about the same today, no better, no worse. 04/27/2022: The right ankle wound has closed. Unfortunately, it looks like some moisture got underneath the dry skin on both of his dorsal feet and these wounds have expanded in size. The ischial wound remains the same with perhaps [name] little bit more slough accumulation than at our previous visit. 05/11/2022: The right ankle wound remains closed. There is [name] left anterior tibial wound that is small has patchy openings with accumulated slough. The dorsum of his right foot appears to be nearly healed with just [name] small punctate opening. The plantar surface of his right foot has [name] new opening that looks like he may have picked some skin there. His sacral ulcer has hypertrophic granulation tissue but has some slough accumulation. The dorsum of his left foot has multiple open areas in [name] fairly ragged distribution. All of these have slough accumulated within them. 06/01/2022: The right ankle and left anterior tibial wound are both closed. Dorsum of his right foot and  left foot both look substantially better with just tiny scattered openings The without any slough accumulation. He has sheared open new areas on his left gluteus and ischium. He says that his wheelchair cushion, which is air-filled, has [name] leak and so it keeps deflating. He is awaiting [name] new cushion. 06/15/2022: The right ankle wound has reopened and the fat layer is exposed. Both dorsal feet have just small openings with just [name] little bit of slough and eschar accumulation. The wound on his left gluteus and ischium is larger again today and has [name] foul odor. 06/29/2022: The right ankle wound has hypertrophic granulation tissue buildup. His dorsal foot wounds both look better with just some eschar on the surface. He has [name] new wound on his left lateral ankle. He is not sure how he acquired it but by appearance, it looks that he hit it on something, potentially his wheelchair  or bed. The ischial wound is about the same but is cleaner without any significant purulent drainage or odor. He did not understand what the [facility] call was about and therefore he does not have the topical compounded antibiotic. 07/13/2022: The right ankle wound again has hypertrophic granulation tissue, but less so than at his previous visit. The ischial wound has improved tremendously the use of the [facility] topical antibiotic. No significant change to the left lateral ankle wound; it is fibrotic with slough present. The skin of both of his feet, especially on the right, has [name] yeasty appearance. Patient History Information obtained from Patient. Family History Diabetes - Father, Hypertension - Mother, No family history of Cancer, Heart Disease, Hereditary Spherocytosis, Kidney Disease, Lung Disease, Stroke, Thyroid Problems, Tuberculosis. Social History Former smoker, Marital Status - Married, Alcohol Use - Moderate, Drug Use - No History, Caffeine Use - Daily. Medical History Ear/Nose/Mouth/Throat Denies history of Chronic  sinus problems/congestion, Middle ear problems Hematologic/Lymphatic Denies history of Anemia, Hemophilia, Human Immunodeficiency Virus, Lymphedema, Sickle Cell Disease Respiratory Patient has history of Sleep Apnea - does not tolerate cpap Denies history of Aspiration, Asthma, Chronic Obstructive Pulmonary Disease (COPD), Pneumothorax, Tuberculosis Cardiovascular Patient has history of Hypertension - lisinopril HCTZ Denies history of Angina, Arrhythmia, Congestive Heart Failure, Coronary Artery Disease, Deep Vein Thrombosis, Hypotension, Myocardial Infarction, Peripheral Arterial Disease, Peripheral Venous Disease, Phlebitis, Vasculitis Gastrointestinal Denies history of Cirrhosis , Colitis, Crohnoos, Hepatitis [name], Hepatitis B, Hepatitis C Endocrine Denies history of Type I Diabetes, Type II Diabetes Genitourinary Denies history of End Stage Renal Disease Immunological Denies history of Lupus Erythematosus, Raynaudoos, Scleroderma Integumentary (Skin) Denies history of History of Burn Musculoskeletal Denies history of Gout, Rheumatoid Arthritis, Osteoarthritis, Osteomyelitis Neurologic Patient has history of Paraplegia Denies history of Dementia, Neuropathy, Quadriplegia, Seizure Disorder Oncologic Denies history of Received Chemotherapy, Received Radiation Psychiatric Denies history of Anorexia/bulimia, Confinement Anxiety Hospitalization/Surgery History - cellulitis in leg. - left leg vein ablation. Objective Constitutional No acute distress.. Vitals Time Taken: 8:51 AM, Height: 70 in, Weight: 216 lbs, BMI: 31, Temperature: 98.1 F, Pulse: 132 bpm, Respiratory Rate: 18 breaths/min, Blood Pressure: 122/75 mmHg. Respiratory Normal work of breathing on room air.. General Notes: 07/13/2022: The right ankle wound again has hypertrophic granulation tissue, but less so than at his previous visit. The ischial wound has improved tremendously the use of the [facility] topical  antibiotic. No significant change to the left lateral ankle wound; it is fibrotic with slough present. The skin of both of his feet, especially on the right, has [name] yeasty appearance. Integumentary (Hair, Skin) Wound #41R status is Open. Original cause of wound was Gradually Appeared. The date acquired was: 03/16/2020. The wound has been in treatment 121 weeks. The wound is located on the Left Ischium. The wound measures 2cm length x 1cm width x 0.1cm depth; 1.571cm^2 area and 0.157cm^3 volume. There is Fat Layer (Subcutaneous Tissue) exposed. There is no tunneling or undermining noted. There is [name] medium amount of purulent drainage noted. The wound margin is distinct with the outline attached to the wound base. There is medium (34-66%) red, pink granulation within the wound bed. There is [name] medium (34-66%) amount of necrotic tissue within the wound bed including Adherent Slough. Wound #52 status is Open. Original cause of wound was Gradually Appeared. The date acquired was: 03/27/2021. The wound has been in treatment 67 weeks. The wound is located on the Right,Dorsal Foot. The wound measures 0.7cm length x 4.5cm width x 0.1cm depth; 2.474cm^2 area and 0.247cm^3  volume. There is Fat Layer (Subcutaneous Tissue) exposed. There is no tunneling or undermining noted. There is [name] medium amount of serous drainage noted. The wound margin is flat and intact. There is small (1-33%) red granulation within the wound bed. There is [name] large (67-100%) amount of necrotic tissue within the wound bed including Adherent Slough. Wound #56 status is Open. Original cause of wound was Gradually Appeared. The date acquired was: 07/11/2021. The wound has been in treatment 51 weeks. The wound is located on the Left,Dorsal Foot. The wound measures 3.6cm length x 4.7cm width x 0.1cm depth; 13.289cm^2 area and 1.329cm^3 volume. There is Fat Layer (Subcutaneous Tissue) exposed. There is no tunneling or undermining noted. There is [name] medium  amount of serous drainage noted. The wound margin is flat and intact. There is medium (34-66%) red, pink granulation within the wound bed. There is [name] medium (34-66%) amount of necrotic tissue within the wound bed including Adherent Slough. Wound #65 status is Open. Original cause of wound was Pressure Injury. The date acquired was: 05/24/2022. The wound has been in treatment 6 weeks. The wound is located on the Left Upper Leg. The wound measures 0.5cm length x 0.9cm width x 0.1cm depth; 0.353cm^2 area and 0.035cm^3 volume. There is Fat Layer (Subcutaneous Tissue) exposed. There is no tunneling or undermining noted. There is [name] medium amount of purulent drainage noted. The wound margin is flat and intact. There is large (67-100%) red, pink granulation within the wound bed. There is [name] small (1-33%) amount of necrotic tissue within the wound bed including Adherent Slough. Wound #66 status is Open. Original cause of wound was Pressure Injury. The date acquired was: 06/15/2022. The wound has been in treatment 4 weeks. The wound is located on the Right,Anterior Ankle. The wound measures 0.8cm length x 0.7cm width x 0.1cm depth; 0.44cm^2 area and 0.044cm^3 volume. There is Fat Layer (Subcutaneous Tissue) exposed. There is no tunneling or undermining noted. There is [name] medium amount of serosanguineous drainage noted. The wound margin is flat and intact. There is large (67-100%) red, friable granulation within the wound bed. There is no necrotic tissue within the wound bed. Wound #67 status is Open. Original cause of wound was Gradually Appeared. The date acquired was: 06/22/2022. The wound has been in treatment 2 weeks. The wound is located on the Left,Lateral Lower Leg. The wound measures 6cm length x 1.4cm width x 0.1cm depth; 6.597cm^2 area and 0.66cm^3 volume. There is Fat Layer (Subcutaneous Tissue) exposed. There is no tunneling or undermining noted. There is [name] medium amount of serosanguineous drainage noted.  The wound margin is flat and intact. There is small (1-33%) pink granulation within the wound bed. There is [name] large (67-100%) amount of necrotic tissue within the wound bed including Adherent Slough. 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 There was [name] Selective/Open Wound Non-Viable Tissue Debridement with [name] total area of 2 sq cm performed by Celine Ahr,  Anderson Malta, MD. With the following instrument(s): Curette to remove Non-Viable tissue/material. Material removed includes Slough and Biofilm and. No specimens were taken. [name] time out was conducted at 09:15, prior to the start of the procedure. [name] Minimum amount of bleeding was controlled with Pressure. The procedure was tolerated well with [name] pain level of Insensate throughout and [name] pain level of Insensate following the procedure. Post Debridement Measurements: 2cm length x 1cm width x 0.1cm depth; 0.157cm^3 volume. Post debridement Stage noted as Category/Stage II. Character of Wound/Ulcer Post Debridement is improved. Post procedure Diagnosis Wound #41R: Same as Pre-Procedure Wound #52 Pre-procedure diagnosis of Wound #52 is [name] Trauma, Other located on the Right,Dorsal Foot . There was [name] Selective/Open Wound Non-Viable Tissue Debridement with [name] total area of 2.1 sq cm performed by Fredirick Maudlin, MD. With the following  instrument(s): Curette to remove Non-Viable tissue/material. Material removed includes Slough and Biofilm and. No specimens were taken. [name] time out was conducted at 09:15, prior to the start of the procedure. [name] Minimum amount of bleeding was controlled with Pressure. The procedure was tolerated well with [name] pain level of Insensate throughout and [name] pain level of Insensate following the procedure. Post Debridement Measurements: 0.7cm length x 4.5cm width x 0.1cm depth; 0.247cm^3 volume. Character of Wound/Ulcer Post Debridement requires further debridement. 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 There was [name] Selective/Open Wound Non-Viable Tissue Debridement with [name] total area of 10.8 sq cm performed by Fredirick Maudlin, MD. With the following instrument(s): Curette to remove Non-Viable tissue/material. Material removed includes Slough and Biofilm and. No specimens were taken. [name] time out was conducted at 09:15, prior to the start of the procedure. [name] Minimum amount of bleeding was controlled with Pressure. The procedure was tolerated well with [name] pain level of Insensate throughout and [name] pain level of Insensate following the procedure. Post Debridement Measurements: 3.6cm length x 4.7cm width x 0.1cm depth; 1.329cm^3 volume. Character of Wound/Ulcer Post Debridement requires further debridement. Post procedure Diagnosis Wound #56: Same as Pre-Procedure Wound #67 Pre-procedure diagnosis of Wound #67 is [name] Venous Leg Ulcer located on the Left,Lateral Lower Leg .Severity of Tissue Pre Debridement is: Fat layer exposed. There was [name] Selective/Open Wound Non-Viable Tissue Debridement with [name] total area of 5.6 sq cm performed by Fredirick Maudlin, MD. With the following instrument(s): Curette to remove Non-Viable tissue/material. Material removed includes [facility]. No specimens were  taken. [name] time out was conducted at 09:15, prior to the start of the procedure. [name] Minimum amount of bleeding was controlled with Pressure. The procedure was tolerated well with [name] pain level of Insensate throughout and [name] pain level of Insensate following the procedure. Post Debridement Measurements: 6cm length x 1.4cm width x 0.1cm depth; 0.66cm^3 volume. Character of Wound/Ulcer Post Debridement requires further debridement. Severity of Tissue Post Debridement is: Fat layer exposed. Post procedure Diagnosis Wound #67: Same as Pre-Procedure Wound #66 Pre-procedure diagnosis of Wound #66 is [name] Venous Leg Ulcer located on the Right,Anterior Ankle . An Chemical Cauterization procedure was performed by Fredirick Maudlin, MD. Post procedure Diagnosis Wound #66: Same as Pre-Procedure Notes: using silver nitrate stick Plan Follow-up Appointments: Return appointment in 3 weeks. - Dr Celine Ahr - Room 1 Tuesday 08/03/2022 @ 8:15pm Bathing/ Shower/ Hygiene: May shower and wash wound with soap and water. - on days that dressing is changed Edema Control - Lymphedema / SCD / Other: Elevate  legs to the level of the heart or above for 30 minutes daily and/or when sitting, [name] frequency of: - throughout the day Moisturize legs daily. - using Aquaphor generously to both legs and feet with dressing changes Compression stocking or Garment 30-40 mm/Hg pressure to: - Juxtalite to both legs daily Off-Loading: Roho cushion for wheelchair Turn and reposition every 2 hours WOUND #41R: - Ischium Wound Laterality: Left Cleanser: Soap and Water Every Other Day/30 Days Discharge Instructions: May shower and wash wound with dial antibacterial soap and water prior to dressing change. Peri-Wound Care: Zinc Oxide Ointment 30g tube Every Other Day/30 Days Discharge Instructions: Apply Zinc Oxide to periwound with each dressing change Topical: Keystone antibiotic compound Every Other Day/30 Days Discharge Instructions: thin layer to  wound bed, use daily once available Prim Dressing: KerraCel Ag Gelling Fiber Dressing, 4x5 in (silver alginate) (Generic) Every Other Day/30 Days ary Discharge Instructions: Apply silver alginate to wound bed as instructed Secondary Dressing: ALLEVYN Gentle Border, 4x4 (in/in) Every Other Day/30 Days Discharge Instructions: Apply over primary dressing as directed. WOUND #52: - Foot Wound Laterality: Dorsal, Right Cleanser: Soap and Water Every Other Day/30 Days Discharge Instructions: May shower and wash wound with dial antibacterial soap and water prior to dressing change. Peri-Wound Care: Triamcinolone 15 (g) Every Other Day/30 Days Discharge Instructions: Use triamcinolone 15 (g) as directed Peri-Wound Care: Sween Lotion (Moisturizing lotion) Every Other Day/30 Days Discharge Instructions: Apply Aquaphor moisturizing lotion as directed Peri-Wound Care: Lotrisone Every Other Day/30 Days Discharge Instructions: or antifungal spray, Apply Lotrisone to periwound Prim Dressing: KerraCel Ag Gelling Fiber Dressing, 2x2 in (silver alginate) Every Other Day/30 Days ary Discharge Instructions: Apply silver alginate to wound bed as instructed Secondary Dressing: Woven Gauze Sponge, Non-Sterile 4x4 in Every Other Day/30 Days Discharge Instructions: Apply over primary dressing as directed. Secured With: [employer], 4.5x3.1 (in/yd) (Generic) Every Other Day/30 Days Discharge Instructions: Secure with Kerlix as directed. Secured With: 19M Medipore H Soft Cloth Surgical T ape, 4 x 10 (in/yd) (Generic) Every Other Day/30 Days Discharge Instructions: Secure with tape as directed. Com pression Stockings: Circaid Juxta Lite Compression Wrap Compression Amount: 30-40 mmHg (right) Discharge Instructions: Apply Circaid Juxta Lite Compression Wrap daily as instructed. Apply first thing in the morning, remove at night before bed. WOUND #56: - Foot Wound Laterality: Dorsal, Left Cleanser: Soap and  Water Every Other Day/30 Days Discharge Instructions: May shower and wash wound with dial antibacterial soap and water prior to dressing change. Peri-Wound Care: Triamcinolone 15 (g) Every Other Day/30 Days Discharge Instructions: Use triamcinolone 15 (g) as directed Peri-Wound Care: Sween Lotion (Moisturizing lotion) Every Other Day/30 Days Discharge Instructions: Apply Aquaphor moisturizing lotion as directed Peri-Wound Care: Lotrisone Every Other Day/30 Days Discharge Instructions: or antifungal spray, Apply Lotrisone to periwound Prim Dressing: KerraCel Ag Gelling Fiber Dressing, 2x2 in (silver alginate) Every Other Day/30 Days ary Discharge Instructions: Apply silver alginate to wound bed as instructed Secondary Dressing: Woven Gauze Sponge, Non-Sterile 4x4 in Every Other Day/30 Days Discharge Instructions: Apply over primary dressing as directed. Secured With: [employer], 4.5x3.1 (in/yd) (Generic) Every Other Day/30 Days Discharge Instructions: Secure with Kerlix as directed. Secured With: 19M Medipore H Soft Cloth Surgical T ape, 4 x 10 (in/yd) (Generic) Every Other Day/30 Days Discharge Instructions: Secure with tape as directed. WOUND #65: - Upper Leg Wound Laterality: Left Cleanser: Soap and Water Every Other Day/30 Days Discharge Instructions: May shower and wash wound with dial antibacterial soap and water prior to  dressing change. Peri-Wound Care: Zinc Oxide Ointment 30g tube Every Other Day/30 Days Discharge Instructions: Apply Zinc Oxide to periwound with each dressing change Topical: Keystone antibiotic compound Every Other Day/30 Days Discharge Instructions: thin layer to wound bed, use daily once available Prim Dressing: KerraCel Ag Gelling Fiber Dressing, 4x5 in (silver alginate) (Generic) Every Other Day/30 Days ary Discharge Instructions: Apply silver alginate to wound bed as instructed Secondary Dressing: ALLEVYN Gentle Border, 4x4 (in/in) Every Other Day/30  Days Discharge Instructions: Apply over primary dressing as directed. WOUND #66: - Ankle Wound Laterality: Right, Anterior Cleanser: Soap and Water Every Other Day/30 Days Discharge Instructions: May shower and wash wound with dial antibacterial soap and water prior to dressing change. Peri-Wound Care: Triamcinolone 15 (g) Every Other Day/30 Days Discharge Instructions: Use triamcinolone 15 (g) as directed Peri-Wound Care: Sween Lotion (Moisturizing lotion) Every Other Day/30 Days Discharge Instructions: Apply Aquaphor moisturizing lotion as directed Peri-Wound Care: Lotrisone Every Other Day/30 Days Discharge Instructions: Apply Lotrisone to periwound Prim Dressing: KerraCel Ag Gelling Fiber Dressing, 2x2 in (silver alginate) Every Other Day/30 Days ary Discharge Instructions: Apply silver alginate to wound bed as instructed Secondary Dressing: Woven Gauze Sponge, Non-Sterile 4x4 in Every Other Day/30 Days Discharge Instructions: Apply over primary dressing as directed. Secured With: [employer], 4.5x3.1 (in/yd) (Generic) Every Other Day/30 Days Discharge Instructions: Secure with Kerlix as directed. Secured With: 38M Medipore H Soft Cloth Surgical T ape, 4 x 10 (in/yd) (Generic) Every Other Day/30 Days Discharge Instructions: Secure with tape as directed. WOUND #67: - Lower Leg Wound Laterality: Left, Lateral Cleanser: Soap and Water Every Other Day/30 Days Discharge Instructions: May shower and wash wound with dial antibacterial soap and water prior to dressing change. Peri-Wound Care: Triamcinolone 15 (g) Every Other Day/30 Days Discharge Instructions: Use triamcinolone 15 (g) as directed Peri-Wound Care: Sween Lotion (Moisturizing lotion) Every Other Day/30 Days Discharge Instructions: Apply Aquaphor moisturizing lotion as directed Peri-Wound Care: Lotrisone Every Other Day/30 Days Discharge Instructions: Apply Lotrisone to periwound Prim Dressing: KerraCel Ag Gelling Fiber  Dressing, 2x2 in (silver alginate) Every Other Day/30 Days ary Discharge Instructions: Apply silver alginate to wound bed as instructed Secondary Dressing: Woven Gauze Sponge, Non-Sterile 4x4 in Every Other Day/30 Days Discharge Instructions: Apply over primary dressing as directed. Secured With: [employer], 4.5x3.1 (in/yd) (Generic) Every Other Day/30 Days Discharge Instructions: Secure with Kerlix as directed. Secured With: 38M Medipore H Soft Cloth Surgical T ape, 4 x 10 (in/yd) (Generic) Every Other Day/30 Days Discharge Instructions: Secure with tape as directed. 07/13/2022: The right ankle wound again has hypertrophic granulation tissue, but less so than at his previous visit. The ischial wound has improved tremendously the use of the [facility] topical antibiotic. No significant change to the left lateral ankle wound; it is fibrotic with slough present. The skin of both of his feet, especially on the right, has [name] yeasty appearance. I used [name] curette to debride biofilm and slough from all of his wounds with the exception of the right ankle wound. I chemically cauterized this with silver nitrate. We will continue to use the [facility] compounded antibiotic on his gluteal wound. Continue silver alginate to all of the others. We will apply ketoconazole to both of his feet, as well. He will follow-up in 3 weeks. Electronic Signature(s) Signed: 07/13/2022 9:35:27 AM By: Fredirick Maudlin MD FACS Entered By: Fredirick Maudlin on 07/13/2022 09:35:27 -------------------------------------------------------------------------------- HxROS Details Patient Name: Date of Service: [place], [name] 07/13/2022 8:15 [name] Medical Record  Number: [id number] Patient Account Number: [id number] Date of Birth/Sex: Treating RN: 03-31-1986 (36 y.o. [name]: [place], [place] Other Clinician: Referring Provider: Treating Provider/Extender: Cornelia Copa Weeks in  Treatment: 340 Information Obtained From Patient Ear/Nose/Mouth/Throat Medical History: Negative for: Chronic sinus problems/congestion; Middle ear problems Hematologic/Lymphatic Medical History: Negative for: Anemia; Hemophilia; Human Immunodeficiency Virus; Lymphedema; Sickle Cell Disease Respiratory Medical History: Positive for: Sleep Apnea - does not tolerate cpap Negative for: Aspiration; Asthma; Chronic Obstructive Pulmonary Disease (COPD); Pneumothorax; Tuberculosis Cardiovascular Medical History: Positive for: Hypertension - lisinopril HCTZ Negative for: Angina; Arrhythmia; Congestive Heart Failure; Coronary Artery Disease; Deep Vein Thrombosis; Hypotension; Myocardial Infarction; Peripheral Arterial Disease; Peripheral Venous Disease; Phlebitis; Vasculitis Gastrointestinal Medical History: Negative for: Cirrhosis ; Colitis; Crohns; Hepatitis [name]; Hepatitis B; Hepatitis C Endocrine Medical History: Negative for: Type I Diabetes; Type II Diabetes Genitourinary Medical History: Negative for: End Stage Renal Disease Immunological Medical History: Negative for: Lupus Erythematosus; Raynauds; Scleroderma Integumentary (Skin) Medical History: Negative for: History of Burn Musculoskeletal Medical History: Negative for: Gout; Rheumatoid Arthritis; Osteoarthritis; Osteomyelitis Neurologic Medical History: Positive for: Paraplegia Negative for: Dementia; Neuropathy; Quadriplegia; Seizure Disorder Oncologic Medical History: Negative for: Received Chemotherapy; Received Radiation Psychiatric Medical History: Negative for: Anorexia/bulimia; Confinement Anxiety Immunizations Pneumococcal Vaccine: Received Pneumococcal Vaccination: No Immunization Notes: doesn't remember when last tetanus shot was Implantable Devices No devices added Hospitalization / Surgery History Type of Hospitalization/Surgery cellulitis in leg left leg vein ablation Family and Social  History Cancer: No; Diabetes: Yes - Father; Heart Disease: No; Hereditary Spherocytosis: No; Hypertension: Yes - Mother; Kidney Disease: No; Lung Disease: No; Stroke: No; Thyroid Problems: No; Tuberculosis: No; Former smoker; Marital Status - Married; Alcohol Use: Moderate; Drug Use: No History; Caffeine Use: Daily; Financial Concerns: No; Food, Clothing or Shelter Needs: No; Support System Lacking: No; Transportation Concerns: No Engineer, maintenance) Signed: 07/13/2022 10:17:20 AM By: Fredirick Maudlin MD FACS Signed: 07/13/2022 5:37:55 PM By: Baruch Gouty RN, BSN Entered By: Fredirick Maudlin on 07/13/2022 09:33:28 -------------------------------------------------------------------------------- SuperBill Details Patient Name: Date of Service: [name], [name] 07/13/2022 Medical Record Number: [id number] Patient Account Number: [id number] Date of Birth/Sex: Treating RN: [birth year] (36 y.o. 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, 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 Non-pressure chronic ulcer of other part of left foot limited to breakdown 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 Facility Procedures CPT4 Code: 18563149 Description: 70263 - DEBRIDE WOUND 1ST 20 SQ CM OR < ICD-10 Diagnosis Description L97.312 Non-pressure chronic ulcer of right ankle with fat layer  exposed 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, stage 3 L97.511 Non-pressure chronic ulcer of other part of  right foot limited to breakdown of s L97.521 Non-pressure chronic ulcer of other part of left foot limited to breakdown of sk Modifier: kin in Quantity: 1 CPT4 Code: 78588502 Description: 77412 - DEBRIDE WOUND EA ADDL 20 SQ CM ICD-10 Diagnosis Description 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, stage 3 L97.511 Non-pressure chronic ulcer of other part of right foot limited  to breakdown of s L97.322 Non-pressure chronic ulcer  of left 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 L97.521 Non-pressure chronic ulcer of other part of left foot limited to breakdown of sk Modifier: kin in Quantity: 1 CPT4 Code: [id number] Description: 62229 - CHEM CAUT GRANULATION TISS ICD-10 Diagnosis Description L97.312 Non-pressure chronic ulcer of right 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 Modifier: Quantity: 1 Physician Procedures : CPT4 Code Description Modifier [id number] - WC PHYS LEVEL 4 - EST PT 25 ICD-10 Diagnosis Description L97.521 Non-pressure chronic ulcer of other part of left foot limited to breakdown of skin L97.312 Non-pressure chronic ulcer of right ankle with  fat layer exposed 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, stage 3 Quantity: 1 : [id number] - WC PHYS DEBR WO ANESTH 20 SQ CM ICD-10 Diagnosis Description 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, stage 3 L97.511 Non-pressure chronic ulcer of other part of  right foot limited to breakdown of skin L97.521 Non-pressure chronic ulcer of other part of left foot limited to breakdown of skin Quantity: 1 : [id number] 63149 - WC PHYS DEBR WO ANESTH EA ADD 20 CM ICD-10 Diagnosis Description 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

## 2022-07-14 DIAGNOSIS — L89224 Pressure ulcer of left hip, stage 4: Secondary | ICD-10-CM | POA: Diagnosis not present

## 2022-08-03 ENCOUNTER — Encounter (HOSPITAL_BASED_OUTPATIENT_CLINIC_OR_DEPARTMENT_OTHER): Payer: BC Managed Care – PPO | Admitting: General Surgery

## 2022-08-10 ENCOUNTER — Encounter (HOSPITAL_BASED_OUTPATIENT_CLINIC_OR_DEPARTMENT_OTHER): Payer: BC Managed Care – PPO | Attending: General Surgery | Admitting: General Surgery

## 2022-08-10 DIAGNOSIS — L97322 Non-pressure chronic ulcer of left 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: Secondary | ICD-10-CM | POA: Insufficient documentation

## 2022-08-10 DIAGNOSIS — L97518 Non-pressure chronic ulcer of other part of right foot with other specified severity: Secondary | ICD-10-CM | POA: Insufficient documentation

## 2022-08-10 DIAGNOSIS — Z89422 Acquired absence of other left toe(s): Secondary | ICD-10-CM | POA: Insufficient documentation

## 2022-08-10 DIAGNOSIS — G8221 Paraplegia, complete: Secondary | ICD-10-CM | POA: Insufficient documentation

## 2022-08-10 DIAGNOSIS — I87332 Chronic venous hypertension (idiopathic) with ulcer and inflammation of left lower extremity: Secondary | ICD-10-CM | POA: Insufficient documentation

## 2022-08-10 DIAGNOSIS — L97511 Non-pressure chronic ulcer of other part of right foot limited to breakdown of skin: Secondary | ICD-10-CM | POA: Insufficient documentation

## 2022-08-10 DIAGNOSIS — L89323 Pressure ulcer of left buttock, stage 3: Secondary | ICD-10-CM | POA: Diagnosis not present

## 2022-08-10 DIAGNOSIS — L97521 Non-pressure chronic ulcer of other part of left foot limited to breakdown of skin: Secondary | ICD-10-CM | POA: Insufficient documentation

## 2022-08-10 DIAGNOSIS — L97312 Non-pressure chronic ulcer of right 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: Secondary | ICD-10-CM | POA: Insufficient documentation

## 2022-08-11 NOTE — Progress Notes (Signed)
Jerkins, GARVEY WESTCOTT ([id number]) Visit Report for 08/10/2022 Arrival Information Details Patient Name: Date of Service: [name], [name] Side. 08/10/2022 8:15 [name] Medical Record Number: [id number] Patient Account Number: [id number] Date of Birth/Sex: Treating RN: 02/11/1986 (36 y.o. Waldron Session Primary Care Keyon Winnick: [place], [place] Other Clinician: Referring Claressa Hughley: Treating Thanos Cousineau/Extender: Cornelia Copa [name]: 41 Visit Information History Since Last Visit All ordered tests and consults were completed: Yes Patient Arrived: Wheel Chair Added or deleted any medications: No Arrival Time: 08:12 Any new allergies or adverse reactions: No Accompanied By: self Had [name] fall or experienced change in No Transfer Assistance: EasyPivot Patient Lift activities of daily living that may affect Patient Identification Verified: Yes risk of falls: Secondary Verification Process Completed: Yes Signs or symptoms of abuse/neglect since last visito No Patient Requires Transmission-Based Precautions: No Hospitalized since last visit: No Patient Has Alerts: Yes Has Dressing in Place as Prescribed: Yes Patient Alerts: R ABI = 1.0 Pain Present Now: No L ABI = 1.1 Electronic Signature(s) Signed: 08/11/2022 5:15:08 PM By: Blanche East RN Entered By: Blanche East on 08/10/2022 08:18:33 -------------------------------------------------------------------------------- Lower Extremity Assessment Details Patient Name: Date of Service: [name], [name] 08/10/2022 8:15 [name] Medical Record Number: [id number] Patient Account Number: [id number] Date of Birth/Sex: Treating RN: 07/21/1986 (36 y.o. Waldron Session Primary Care Justeen Hehr: [place], [place] Other Clinician: Referring Joani Cosma: Treating Elio Haden/Extender: Graciela Husbands, [name]: 344 Edema Assessment Assessed: [Left: No] [Right: No] Edema: [Left: Yes] [Right: Yes] Calf Left: Right: Point of  Measurement: 33 cm From Medial Instep 25 cm 3 cm Ankle Left: Right: Point of Measurement: 10 cm From Medial Instep 19.5 cm 24.5 cm Vascular Assessment Pulses: Dorsalis Pedis Palpable: [Left:Yes] [Right:Yes] Electronic Signature(s) Signed: 08/11/2022 5:15:08 PM By: Blanche East RN Entered By: Blanche East on 08/10/2022 08:24:19 -------------------------------------------------------------------------------- Multi Wound Chart Details Patient Name: Date of Service: [name], [name] 08/10/2022 8:15 [name] Medical Record Number: [id number] Patient Account Number: [id number] Date of Birth/Sex: Treating RN: [birth year] (37 y.o. M) Primary Care Brianny Soulliere: [name], [name] Other Clinician: Referring Kathyjo Briere: Treating Sarthak Rubenstein/Extender: Graciela Husbands, [name]: 344 Vital Signs Height(in): 70 Pulse(bpm): 113 Weight(lbs): 216 Blood Pressure(mmHg): 136/72 Body Mass Index(BMI): 31 Temperature(F): 98.7 Respiratory Rate(breaths/min): 16 Photos: [41R:No Photos Left Ischium] [52:No Photos Right, Dorsal Foot] [56:No Photos Left, Dorsal Foot] Wound Location: [41R:Gradually Appeared] [52:Gradually Appeared] [56:Gradually Appeared] Wounding Event: [41R:Pressure Ulcer] [52:Trauma, Other] [56:Neuropathic Ulcer-Non Diabetic] Primary Etiology: [41R:Sleep Apnea, Hypertension, Paraplegia Sleep Apnea, Hypertension, Paraplegia Sleep Apnea, Hypertension, Paraplegia] Comorbid History: [41R:03/16/2020] [52:03/27/2021] [56:07/11/2021] Date Acquired: [41R:125] [52:71] [56:55] Weeks of Treatment: [41R:Open] [52:Open] [56:Open] Wound Status: [41R:Yes] [52:No] [56:No] Wound Recurrence: [41R:Yes] [52:No] [56:Yes] Clustered Wound: [41R:3] [52:[name]] [56:[name]] Clustered Quantity: [41R:2.5x1x0.1] [52:1x3x0.1] [56:2x2x0.1] Measurements L x W x D (cm) [41R:1.963] [52:2.356] [56:3.142] [name] (cm) : rea [41R:0.196] [52:0.236] [56:0.314] Volume (cm) : [41R:-8.70%] [52:-25.00%] [56:49.50%] % Reduction in [name]  rea: [41R:-8.30%] [52:-25.50%] [56:49.50%] % Reduction in Volume: [41R:Category/Stage II] [52:Full Thickness Without Exposed] [56:Full Thickness Without Exposed] Classification: [41R:Medium] [52:Support Structures Medium] [56:Support Structures Medium] Exudate [name] mount: [41R:Purulent] [52:Serous] [56:Serous] Exudate Type: [41R:yellow, brown, green] [52:amber] [56:amber] Exudate Color: [41R:Distinct, outline attached] [52:Flat and Intact] [56:Flat and Intact] Wound Margin: [41R:Medium (34-66%)] [52:Small (1-33%)] [56:Medium (34-66%)] Granulation [name] mount: [41R:Red, Pink] [52:Red] [56:Red, Pink] Granulation Quality: [41R:Medium (34-66%)] [52:Large (67-100%)] [56:Medium (34-66%)] Necrotic [name] mount: [41R:Adherent Slough] [52:Eschar] [56:Eschar, Adherent Slough] Necrotic Tissue: [41R:Fat Layer (Subcutaneous Tissue): Yes Fat Layer (Subcutaneous Tissue): Yes Fat Layer (Subcutaneous Tissue): Yes]  Exposed Structures: [41R:Fascia: No Tendon: No Muscle: No Joint: No Bone: No Medium (34-66%)] [52:Fascia: No Tendon: No Muscle: No Joint: No Bone: No Medium (34-66%)] [56:Fascia: No Tendon: No Muscle: No Joint: No Bone: No Large (67-100%)] Epithelialization: [41R:Debridement - Selective/Open Wound Debridement - Selective/Open Wound Debridement - Selective/Open Wound] Debridement: Pre-procedure Verification/Time Out 08:51 [52:08:51] [56:08:51] Taken: [41R:Slough] [52:Necrotic/Eschar] [56:Necrotic/Eschar] Tissue Debrided: [41R:Non-Viable Tissue] [52:Non-Viable Tissue] [56:Non-Viable Tissue] Level: [41R:2.5] [52:3] [56:4] Debridement [name] (sq cm): [41R:rea Curette] [52:Curette] [56:Curette] Instrument: [41R:Minimum] [52:Minimum] [56:Minimum] Bleeding: [41R:Pressure] [52:Pressure] [56:Pressure] Hemostasis [name] chieved: [41R:0] [52:0] [56:0] Procedural Pain: [41R:0] [52:0] [56:0] Post Procedural Pain: [41R:Procedure was tolerated well] [52:Procedure was tolerated well] [56:Procedure was tolerated well] Debridement  Treatment Response: [41R:2.5x1x0.1] [52:1x3x0.1] [56:2x2x0.1] Post Debridement Measurements L x W x D (cm) [41R:0.196] [52:0.236] [56:0.314] Post Debridement Volume: (cm) [41R:Category/Stage II] [52:[name]] [56:[name]] Post Debridement Stage: [41R:Debridement] [52:Debridement] [56:Debridement] Procedures Performed: [41R:65] [52:66] [56:67] Photos: [41R:No Photos Left Upper Leg] [52:No Photos Right, Anterior Ankle] [56:No Photos Left, Lateral Lower Leg] Wound Location: [41R:Pressure Injury] [52:Pressure Injury] [56:Gradually Appeared] Wounding Event: [41R:Pressure Ulcer] [52:Venous Leg Ulcer] [56:Venous Leg Ulcer] Primary Etiology: [41R:Sleep Apnea, Hypertension, Paraplegia Sleep Apnea, Hypertension, Paraplegia [name]] Comorbid History: [41R:05/24/2022] [52:06/15/2022] [56:06/22/2022] Date Acquired: [41R:10] [52:8] [56:6] Weeks of Treatment: [41R:Open] [52:Open] [56:Open] Wound Status: [41R:No] [52:No] [56:No] Wound Recurrence: [41R:No] [52:No] [56:No] Clustered Wound: [41R:[name]] [52:[name]] [56:[name]] Clustered Quantity: [41R:3.6x1x0.1] [52:2.2x1.2x0.1] [56:4x1.7x0.1] Measurements L x W x D (cm) [41R:2.827] [52:2.073] [56:5.341] [name] (cm) : rea [41R:0.283] [52:0.207] [56:0.534] Volume (cm) : [41R:55.00%] [52:-63.00%] [56:4.80%] % Reduction in [name] rea: [41R:54.90%] [52:-63.00%] [56:4.80%] % Reduction in Volume: [41R:Category/Stage III] [52:Full Thickness Without Exposed] [56:Full Thickness Without Exposed] Classification: [41R:Medium] [52:Support Structures Medium] [56:Support Structures Medium] Exudate [name] mount: [41R:Purulent] [52:Serosanguineous] [56:Serosanguineous] Exudate Type: [41R:yellow, brown, green] [52:red, brown] [56:red, brown] Exudate Color: [41R:Flat and Intact] [52:Flat and Intact] [56:[name]] Wound Margin: [41R:Large (67-100%)] [52:Large (67-100%)] [56:[name]] Granulation [name] mount: [41R:Red, Pink] [52:Red, Friable] [56:[name]] Granulation Quality: [41R:Small (1-33%)] [52:None Present (0%)]  [56:[name]] Necrotic [name] mount: [41R:Adherent Slough] [52:[name]] [56:[name]] Necrotic Tissue: [41R:Fat Layer (Subcutaneous Tissue): Yes Fat Layer (Subcutaneous Tissue): Yes [name]] Exposed Structures: [41R:Fascia: No Tendon: No Muscle: No Joint: No Bone: No Large (67-100%)] [52:Fascia: No Tendon: No Muscle: No Joint: No Bone: No Small (1-33%)] [56:[name]] Epithelialization: [41R:Debridement - Selective/Open Wound [name]] [56:Debridement - Selective/Open Wound] Debridement: Pre-procedure Verification/Time Out 08:51 [52:[name]] [56:08:51] Taken: [41R:Slough] [52:[name]] [56:Necrotic/Eschar] Tissue Debrided: [41R:Non-Viable Tissue] [52:[name]] [56:Non-Viable Tissue] Level: [41R:3.6] [52:[name]] [56:6.8] Debridement [name] (sq cm): [41R:rea Curette] [52:[name]] [56:Curette] Instrument: [41R:Minimum] [52:[name]] [56:Minimum] Bleeding: [41R:Pressure] [52:[name]] [56:Pressure] Hemostasis [name] chieved: [41R:0] [52:[name]] [56:0] Procedural Pain: [41R:0] [52:[name]] [56:0] Post Procedural Pain: [41R:Procedure was tolerated well] [52:[name]] [56:Procedure was tolerated well] Debridement Treatment Response: [41R:3.6x1x0.1] [52:[name]] [56:4x1.7x0.1] Post Debridement Measurements L x W x D (cm) [41R:0.283] [52:[name]] [56:0.534] Post Debridement Volume: (cm) [41R:Category/Stage III] [52:[name]] [56:[name]] Post Debridement Stage: [41R:Chemical Cauterization] [52:Chemical Cauterization] [56:Debridement] Procedures Performed: [41R:Debridement 68] [52:[name]] [56:[name]] Photos: [41R:No Photos Left, Medial Upper Leg] [52:[name] [name]] [56:[name] [name]] Wound Location: [41R:Trauma] [52:[name]] [56:[name]] Wounding Event: [41R:Abrasion] [52:[name]] [56:[name]] Primary Etiology: [41R:Sleep Apnea, Hypertension, Paraplegia [name]] [56:[name]] Comorbid History: [41R:08/08/2022] [52:[name]] [56:[name]] Date Acquired: [41R:0] [52:[name]] [56:[name]] Weeks of Treatment: [41R:Open] [52:[name]] [56:[name]] Wound Status: [41R:No] [52:[name]] [56:[name]] Wound Recurrence: [41R:No] [52:[name]] [56:[name]] Clustered Wound: [41R:[name]] [52:[name]]  [56:[name]] Clustered Quantity: [41R:3x2x0.1] [52:[name]] [56:[name]] Measurements L x W x D (cm) [41R:4.712] [52:[name]] [56:[name]] [name] (cm) : rea [41R:0.471] [52:[name]] [56:[name]] Volume (cm) : [41R:0.00%] [52:[name]] [56:[name]] % Reduction in [name] rea: [41R:0.00%] [52:[name]] [56:[name]] % Reduction in Volume: [41R:Full Thickness Without Exposed] [  52:[name]] [56:[name]] Classification: [41R:Support Structures Medium] [52:[name]] [56:[name]] Exudate [name] mount: [41R:Sanguinous] [52:[name]] [56:[name]] Exudate Type: [41R:red] [52:[name]] [56:[name]] Exudate Color: [41R:[name]] [52:[name]] [56:[name]] Wound Margin: [41R:Small (1-33%)] [52:[name]] [56:[name]] Granulation Amount: [41R:Red] [52:[name]] [56:[name]] Granulation Quality: [41R:Large (67-100%)] [52:[name]] [56:[name]] Necrotic Amount: [41R:Eschar, Adherent Slough] [52:[name]] [56:[name]] Necrotic Tissue: [41R:Fat Layer (Subcutaneous Tissue): Yes [name]] [56:[name]] Exposed Structures: [41R:Fascia: No Tendon: No Muscle: No Joint: No Bone: No [name]] [52:[name]] [56:[name]] Epithelialization: [41R:Debridement - Selective/Open Wound [name]] [56:[name]] Debridement: [41R:08:51] [52:[name]] [56:[name]] Pre-procedure Verification/Time Out Taken: [41R:Necrotic/Eschar] [52:[name]] [56:[name]] Tissue Debrided: [41R:Skin/Epidermis] [52:[name]] [56:[name]] Level: [41R:6] [52:[name]] [56:[name]] Debridement [name] (sq cm): [41R:rea Curette, Forceps, Scissors] [52:[name]] [56:[name]] Instrument: [41R:Minimum] [52:[name]] [56:[name]] Bleeding: [41R:Pressure] [52:[name]] [56:[name]] Hemostasis [name] chieved: [41R:0] [52:[name]] [56:[name]] Procedural Pain: [41R:0] [52:[name]] [56:[name]] Post Procedural Pain: [41R:Procedure was tolerated well] [52:[name]] [56:[name]] Debridement Treatment Response: [41R:3x2x0.1] [52:[name]] [56:[name]] Post Debridement Measurements L x W x D (cm) [41R:0.471] [52:[name]] [56:[name]] Post Debridement Volume: (cm) [41R:[name]] [52:[name]] [56:[name]] Post Debridement Stage: [41R:Chemical Cauterization] [52:[name]] [56:[name]] Procedures Performed: [41R:Debridement] Treatment Notes Electronic  Signature(s) Signed: 08/10/2022 9:13:07 AM By: Fredirick Maudlin MD FACS Entered By: Fredirick Maudlin on 08/10/2022 09:13:07 -------------------------------------------------------------------------------- Multi-Disciplinary Care Plan Details Patient Name: Date of Service: [name], [name] 08/10/2022 8:15 [name] Medical Record Number: [id number] Patient Account Number: [id number] Date of Birth/Sex: Treating RN: 1986-02-04 (36 y.o. Waldron Session Primary Care Rogan Ecklund: [name], [name] Other Clinician: Referring Hayes Rehfeldt: Treating Madysyn Hanken/Extender: Cornelia Copa [name]: [place] reviewed with physician Active Inactive Pressure Nursing Diagnoses: Knowledge deficit related to management of pressures ulcers Potential for impaired tissue integrity related to pressure, friction, moisture, and shear Goals: Patient will remain free from development of additional pressure ulcers Date Initiated: 06/21/2018 Date Inactivated: 02/12/2019 Target Resolution Date: 02/16/2019 Unmet Reason: pressure ulcer Goal Status: Unmet reopened on left ischium Patient/caregiver will verbalize risk factors for pressure ulcer development Date Initiated: 06/21/2018 Date Inactivated: 05/28/2019 Target Resolution Date: 06/01/2019 Goal Status: Met Patient/caregiver will verbalize understanding of pressure ulcer management Date Initiated: 06/01/2022 Target Resolution Date: 09/06/2022 Goal Status: Active Interventions: Assess: immobility, friction, shearing, incontinence upon admission and as needed Assess offloading mechanisms upon admission and as needed Assess potential for pressure ulcer upon admission and as needed Treatment Activities: Pressure reduction/relief device ordered : 12/05/2017 Notes: Wound/Skin Impairment Nursing Diagnoses: Impaired tissue integrity Knowledge deficit related to ulceration/compromised skin integrity Goals: Patient/caregiver will  verbalize understanding of skin care regimen Date Initiated: 06/01/2022 Target Resolution Date: 09/06/2022 Goal Status: Active Ulcer/skin breakdown will have [name] volume reduction of 30% by week 4 Date Initiated: 06/01/2022 Date Inactivated: 06/29/2022 Target Resolution Date: 06/29/2022 Unmet Reason: complex wounds, Goal Status: Unmet infection Interventions: Assess patient/caregiver ability to obtain necessary supplies Assess ulceration(s) every visit Provide education on ulcer and skin care Notes: 02/02/21: Complex Care, ongoing. Electronic Signature(s) Signed: 08/11/2022 5:15:08 PM By: Blanche East RN Entered By: Blanche East on 08/10/2022 08:42:38 -------------------------------------------------------------------------------- Pain Assessment Details Patient Name: Date of Service: [name], [name] 08/10/2022 8:15 [name] Medical Record Number: [id number] Patient Account Number: [id number] Date of Birth/Sex: Treating RN: [birth year] (36 y.o. Waldron Session Primary Care Brady Schiller: [place], [place] Other Clinician: Referring Aneliz Carbary: Treating Kunio Cummiskey/Extender: Cornelia Copa [name]: 344 Active Problems Location of Pain Severity and Description of Pain Patient Has Paino No Site Locations Pain Management and Medication Current Pain Management: Electronic Signature(s) Signed: 08/11/2022 5:15:08 PM By: Blanche East RN Entered By: Blanche East on 08/10/2022 08:19:54 -------------------------------------------------------------------------------- Patient/Caregiver Education Details Patient Name: Date of Service: [name],  [name] 9/19/2023andnbsp8:15 [name] Medical Record Number: [id number] Patient Account Number: [id number] Date of Birth/Gender: Treating RN: 1986-06-07 (36 y.o. Waldron Session Primary Care Physician: Janine Limbo Other Clinician: Referring Physician: Treating Physician/Extender: Cornelia Copa [name]:  52 Education Assessment Education Provided To: Patient Education Topics Provided Wound/Skin Impairment: Methods: Explain/Verbal Responses: Reinforcements needed, State content correctly Electronic Signature(s) Signed: 08/11/2022 5:15:08 PM By: Blanche East RN Entered By: Blanche East on 08/10/2022 08:43:03 -------------------------------------------------------------------------------- Wound Assessment Details Patient Name: Date of Service: [name], [name] 08/10/2022 8:15 [name] Medical Record Number: [id number] Patient Account Number: [id number] Date of Birth/Sex: Treating RN: 08-01-1986 (36 y.o. Waldron Session Primary Care Llewellyn Schoenberger: [place], [place] Other Clinician: Referring Ammi Hutt: Treating Manpreet Kemmer/Extender: Cornelia Copa [name]: 344 Wound Status Wound Number: [id number] Primary Etiology: Pressure Ulcer Wound Location: Left Ischium Wound Status: Open Wounding Event: Gradually Appeared Comorbid History: Sleep Apnea, Hypertension, Paraplegia Date Acquired: 03/16/2020 Weeks Of Treatment: 125 Clustered Wound: Yes Wound Measurements Length: (cm) 2.5 Width: (cm) 1 Depth: (cm) 0.1 Clustered Quantity: 3 Area: (cm) 1.963 Volume: (cm) 0.196 % Reduction in Area: -8.7% % Reduction in Volume: -8.3% Epithelialization: Medium (34-66%) Tunneling: No Undermining: No Wound Description Classification: Category/Stage II Wound Margin: Distinct, outline attached Exudate Amount: Medium Exudate Type: Purulent Exudate Color: yellow, brown, green Wound Bed Granulation Amount: Medium (34-66%) Granulation Quality: Red, Pink Necrotic Amount: Medium (34-66%) Necrotic Quality: Adherent Slough Foul Odor After Cleansing: No Slough/Fibrino Yes Exposed Structure Fascia Exposed: No Fat Layer (Subcutaneous Tissue) Exposed: Yes Tendon Exposed: No Muscle Exposed: No Joint Exposed: No Bone Exposed: No Electronic Signature(s) Signed: 08/11/2022 5:15:08 PM By:  Blanche East RN Entered By: Blanche East on 08/10/2022 08:38:29 -------------------------------------------------------------------------------- Wound Assessment Details Patient Name: Date of Service: [name], [name] 08/10/2022 8:15 [name] Medical Record Number: [id number] Patient Account Number: [id number] Date of Birth/Sex: Treating RN: 14-Sep-1986 (36 y.o. Waldron Session Primary Care Barbara Keng: [place], [place] Other Clinician: Referring Aquila Menzie: Treating Anahit Klumb/Extender: Graciela Husbands, [name]: 344 Wound Status Wound Number: [id number] Primary Etiology: Trauma, Other Wound Location: Right, Dorsal Foot Wound Status: Open Wounding Event: Gradually Appeared Comorbid History: Sleep Apnea, Hypertension, Paraplegia Date Acquired: 03/27/2021 Weeks Of Treatment: 71 Clustered Wound: No Wound Measurements Length: (cm) 1 Width: (cm) 3 Depth: (cm) 0.1 Area: (cm) 2.356 Volume: (cm) 0.236 % Reduction in Area: -25% % Reduction in Volume: -25.5% Epithelialization: Medium (34-66%) Tunneling: No Undermining: No Wound Description Classification: Full Thickness Without Exposed Support Structures Wound Margin: Flat and Intact Exudate Amount: Medium Exudate Type: Serous Exudate Color: amber Foul Odor After Cleansing: No Slough/Fibrino Yes Wound Bed Granulation Amount: Small (1-33%) Exposed Structure Granulation Quality: Red Fascia Exposed: No Necrotic Amount: Large (67-100%) Fat Layer (Subcutaneous Tissue) Exposed: Yes Necrotic Quality: Eschar Tendon Exposed: No Muscle Exposed: No Joint Exposed: No Bone Exposed: No Electronic Signature(s) Signed: 08/11/2022 5:15:08 PM By: Blanche East RN Entered By: Blanche East on 08/10/2022 08:39:04 -------------------------------------------------------------------------------- Wound Assessment Details Patient Name: Date of Service: [place], [name] 08/10/2022 8:15 [name] Medical Record Number: [id number] Patient Account  Number: [id number] Date of Birth/Sex: Treating RN: [birth year] (36 y.o. Waldron Session Primary Care Norrin Shreffler: [place], [place] Other Clinician: Referring Chiyeko Ferre: Treating Chord Takahashi/Extender: Graciela Husbands, [name]: 344 Wound Status Wound Number: [id number] Primary Etiology: Neuropathic Ulcer-Non Diabetic Wound Location: Left, Dorsal Foot Wound Status: Open Wounding Event: Gradually Appeared Comorbid History: Sleep Apnea, Hypertension, Paraplegia Date Acquired: 07/11/2021 Weeks Of Treatment: 55 Clustered  Wound: Yes Wound Measurements Length: (cm) 2 Width: (cm) 2 Depth: (cm) 0.1 Area: (cm) 3.142 Volume: (cm) 0.314 % Reduction in Area: 49.5% % Reduction in Volume: 49.5% Epithelialization: Large (67-100%) Tunneling: No Undermining: No Wound Description Classification: Full Thickness Without Exposed Support Structures Wound Margin: Flat and Intact Exudate Amount: Medium Exudate Type: Serous Exudate Color: amber Foul Odor After Cleansing: No Slough/Fibrino Yes Wound Bed Granulation Amount: Medium (34-66%) Exposed Structure Granulation Quality: Red, Pink Fascia Exposed: No Necrotic Amount: Medium (34-66%) Fat Layer (Subcutaneous Tissue) Exposed: Yes Necrotic Quality: Eschar, Adherent Slough Tendon Exposed: No Muscle Exposed: No Joint Exposed: No Bone Exposed: No Electronic Signature(s) Signed: 08/11/2022 5:15:08 PM By: Blanche East RN Entered By: Blanche East on 08/10/2022 08:40:02 -------------------------------------------------------------------------------- Wound Assessment Details Patient Name: Date of Service: [place], [name] 08/10/2022 8:15 [name] Medical Record Number: [id number] Patient Account Number: [id number] Date of Birth/Sex: Treating RN: 1986/05/22 (36 y.o. Waldron Session Primary Care Rosy Estabrook: [place], [place] Other Clinician: Referring Shela Esses: Treating Siearra Amberg/Extender: Graciela Husbands, [name]:  344 Wound Status Wound Number: 65 Primary Etiology: Pressure Ulcer Wound Location: Left Upper Leg Wound Status: Open Wounding Event: Pressure Injury Comorbid History: Sleep Apnea, Hypertension, Paraplegia Date Acquired: 05/24/2022 Weeks Of Treatment: 10 Clustered Wound: No Wound Measurements Length: (cm) 3.6 Width: (cm) 1 Depth: (cm) 0.1 Area: (cm) 2.827 Volume: (cm) 0.283 % Reduction in Area: 55% % Reduction in Volume: 54.9% Epithelialization: Large (67-100%) Tunneling: No Undermining: No Wound Description Classification: Category/Stage III Wound Margin: Flat and Intact Exudate Amount: Medium Exudate Type: Purulent Exudate Color: yellow, brown, green Foul Odor After Cleansing: No Slough/Fibrino No Wound Bed Granulation Amount: Large (67-100%) Exposed Structure Granulation Quality: Red, Pink Fascia Exposed: No Necrotic Amount: Small (1-33%) Fat Layer (Subcutaneous Tissue) Exposed: Yes Necrotic Quality: Adherent Slough Tendon Exposed: No Muscle Exposed: No Joint Exposed: No Bone Exposed: No Electronic Signature(s) Signed: 08/11/2022 5:15:08 PM By: Blanche East RN Entered By: Blanche East on 08/10/2022 08:40:30 -------------------------------------------------------------------------------- Wound Assessment Details Patient Name: Date of Service: [name], [name] 08/10/2022 8:15 [name] Medical Record Number: [id number] Patient Account Number: [id number] Date of Birth/Sex: Treating RN: [birth year] (36 y.o. Waldron Session Primary Care [name] Howland: [place], [place] Other Clinician: Referring Valjean Ruppel: Treating Terrisa Curfman/Extender: Graciela Husbands, [name]: 344 Wound Status Wound Number: 66 Primary Etiology: Venous Leg Ulcer Wound Location: Right, Anterior Ankle Wound Status: Open Wounding Event: Pressure Injury Comorbid History: Sleep Apnea, Hypertension, Paraplegia Date Acquired: 06/15/2022 Weeks Of Treatment: 8 Clustered Wound: No Wound  Measurements Length: (cm) 2.2 Width: (cm) 1.2 Depth: (cm) 0.1 Area: (cm) 2.073 Volume: (cm) 0.207 % Reduction in Area: -63% % Reduction in Volume: -63% Epithelialization: Small (1-33%) Tunneling: No Undermining: No Wound Description Classification: Full Thickness Without Exposed Support Structures Wound Margin: Flat and Intact Exudate Amount: Medium Exudate Type: Serosanguineous Exudate Color: red, brown Foul Odor After Cleansing: No Slough/Fibrino No Wound Bed Granulation Amount: Large (67-100%) Exposed Structure Granulation Quality: Red, Friable Fascia Exposed: No Necrotic Amount: None Present (0%) Fat Layer (Subcutaneous Tissue) Exposed: Yes Tendon Exposed: No Muscle Exposed: No Joint Exposed: No Bone Exposed: No Electronic Signature(s) Signed: 08/11/2022 5:15:08 PM By: Blanche East RN Entered By: Blanche East on 08/10/2022 08:40:57 -------------------------------------------------------------------------------- Wound Assessment Details Patient Name: Date of Service: [name], [name] 08/10/2022 8:15 [name] Medical Record Number: [id number] Patient Account Number: [id number] Date of Birth/Sex: Treating RN: [birth year] (36 y.o. Waldron Session Primary Care Jerron Niblack: [name], [name] Other Clinician: Referring Lataria Courser: Treating Raiven Belizaire/Extender: Celine Ahr,  Jennifer [name], [name]: 344 Wound Status Wound Number: [id number] Primary Etiology: Venous Leg Ulcer Wound Location: Left, Lateral Lower Leg Wound Status: Open Wounding Event: Gradually Appeared Date Acquired: 06/22/2022 Weeks Of Treatment: 6 Clustered Wound: No Wound Measurements Length: (cm) 4 Width: (cm) 1.7 Depth: (cm) 0.1 Area: (cm) 5.341 Volume: (cm) 0.534 % Reduction in Area: 4.8% % Reduction in Volume: 4.8% Wound Description Classification: Full Thickness Without Exposed Support Structu Exudate Amount: Medium Exudate Type: Serosanguineous Exudate Color: red, brown res Electronic  Signature(s) Signed: 08/11/2022 5:15:08 PM By: Blanche East RN Entered By: Blanche East on 08/10/2022 08:28:02 -------------------------------------------------------------------------------- Wound Assessment Details Patient Name: Date of Service: [name], [name] 08/10/2022 8:15 [name] Medical Record Number: [id number] Patient Account Number: [id number] Date of Birth/Sex: Treating RN: October 03, 1986 (36 y.o. Waldron Session Primary Care Prudie Guthridge: [place], Aucilla Other Clinician: Referring Marchell Froman: Treating Brice Kossman/Extender: Graciela Husbands, [name]: 344 Wound Status Wound Number: [id number] Primary Etiology: Abrasion Wound Location: Left, Medial Upper Leg Wound Status: Open Wounding Event: Trauma Comorbid History: Sleep Apnea, Hypertension, Paraplegia Date Acquired: 08/08/2022 Weeks Of Treatment: 0 Clustered Wound: No Wound Measurements Length: (cm) 3 Width: (cm) 2 Depth: (cm) 0.1 Area: (cm) 4.712 Volume: (cm) 0.471 % Reduction in Area: 0% % Reduction in Volume: 0% Tunneling: No Undermining: No Wound Description Classification: Full Thickness Without Exposed Support Structures Exudate Amount: Medium Exudate Type: Sanguinous Exudate Color: red Foul Odor After Cleansing: No Slough/Fibrino Yes Wound Bed Granulation Amount: Small (1-33%) Exposed Structure Granulation Quality: Red Fascia Exposed: No Necrotic Amount: Large (67-100%) Fat Layer (Subcutaneous Tissue) Exposed: Yes Necrotic Quality: Eschar, Adherent Slough Tendon Exposed: No Muscle Exposed: No Joint Exposed: No Bone Exposed: No Electronic Signature(s) Signed: 08/11/2022 5:15:08 PM By: Blanche East RN Entered By: Blanche East on 08/10/2022 08:41:25 -------------------------------------------------------------------------------- [place] Details Patient Name: Date of Service: Biedermann, [name] 08/10/2022 8:15 [name] Medical Record Number: [id number] Patient Account Number: [id number] Date of  Birth/Sex: Treating RN: 1986-01-08 (36 y.o. Waldron Session Primary Care [place]: [place], [place] Other Clinician: Referring Emmerson Shuffield: Treating Halyn Flaugher/Extender: Graciela Husbands, [name]: 344 Vital Signs Time Taken: 08:18 Temperature (F): 98.7 Height (in): 70 Pulse (bpm): 113 Weight (lbs): 216 Respiratory Rate (breaths/min): 16 Body Mass Index (BMI): 31 Blood Pressure (mmHg): 136/72 Reference Range: 80 - 120 mg / dl Electronic Signature(s) Signed: 08/11/2022 5:15:08 PM By: Blanche East RN Entered By: Blanche East on 08/10/2022 08:19:33

## 2022-08-11 NOTE — Progress Notes (Signed)
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 11/12/16. He had developed progressive cellulitis on the right leg in spite of the doxycycline I gave him. I'd spoken to the hospitalist in [place] who was concerned about continuing leukocytosis. CT scan is what I suggested this was done which showed soft tissue swelling without evidence of osteomyelitis or an underlying abscess blood cultures were negative. At [facility] he was treated with vancomycin and Primaxin and then add an infectious disease consult. He was transitioned to Ceftaroline. He has been making progressive improvement. Overall [name] severe cellulitis of the right leg. He is been using silver alginate to her original wound on the left leg. The wounds in his toes on the right are closed there is [name] small open area on the base of the left second toe 11/26/15; the patient's right leg is much better although there is still some edema here this could be reminiscent from his severe cellulitis likely on top of some degree of lymphedema. His left anterior leg wound has less surface slough as reported by her intake nurse. Small wound at the base of the left second toe 12/02/16; patient's right leg is better and there is no open wound here. His left anterior  lateral leg wound continues to have [name] healthy-looking surface. Small wound at the base of the left second toe however there is erythema in the left forefoot which is worrisome 12/16/16; is no open wounds on his right leg. We took measurements for stockings. His left anterior lateral leg wound continues to have [name] healthy-looking surface. I'm not sure where we were with the Apligraf run through his insurance. We have been using Iodoflex. He has [name] thick eschar on the left first second toe interface, I suspect this may be fungal however there is no visible open 12/23/16; no open wound on his right leg. He has 2 small areas left of the linear wound that was remaining last week. We have been using Prisma, I thought I have disclosed this week, we can only look forward to next week 01/03/17; the patient had concerning areas of erythema last week, already on doxycycline for UTI through his primary doctor. The erythema is absolutely no better there is warmth and swelling both medially from the left lateral leg wound and also the dorsal left foot. 01/06/17- Patient is here for follow-up evaluation of his left lateral leg ulcer and bilateral feet ulcers. He is on oral antibiotic therapy, tolerating that. Nursing staff and the patient states that the erythema is improved from Monday. 01/13/17; the predominant left lateral leg wound continues to be problematic. I had put Apligraf on him earlier this month once. However he subsequently developed what appeared to be an intense cellulitis around the left lateral leg wound. I gave him Dalvance I think on 2/12 perhaps 2/13 he continues on cefdinir. The erythema is still present but the warmth and swelling is improved. I am hopeful that the cellulitis part of this control. I wouldn't be surprised if there is an element of venous inflammation as well. 01/17/17. The erythema is  present but better in the left leg. His left lateral leg wound still does not have [name] viable surface  buttons certain parts of this long thin wound it appears like there has been improvement in dimensions. 01/20/17; the erythema still present but much better in the left leg. I'm thinking this is his usual degree of chronic venous inflammation. The wound on the left leg looks somewhat better. Is less surface slough 01/27/17; erythema is back to the chronic venous inflammation. The wound on the left leg is somewhat better. I am back to the point where I like to try an Apligraf once again 02/10/17; slight improvement in wound dimensions. Apligraf #2. He is completing his doxycycline 02/14/17; patient arrives today having completed doxycycline last Thursday. This was supposed to be [name] nurse visit however once again he hasn't tense erythema from the medial part of his wound extending over the lower leg. Also erythema in his foot this is roughly in the same distribution as last time. He has baseline chronic venous inflammation however this is [name] lot worse than the baseline I have learned to accept the on him is baseline inflammation 02/24/17- patient is here for follow-up evaluation. He is tolerating compression therapy. His voicing no complaints or concerns he is here anticipating an Apligraf 03/03/17; he arrives today with an adherent necrotic surface. I don't think this is surface is going to be amenable for Apligraf's. The erythema around his wound and on the left dorsal foot has resolved he is off antibiotics 03/10/17; better-looking surface today. I don't think he can tolerate Apligraf's. He tells me he had [name] wound VAC after [name] skin graft years ago to this area and they had difficulty with [name] seal. The erythema continues to be stable around this some degree of chronic venous inflammation but he also has recurrent cellulitis. We have been using Iodoflex 03/17/17; continued improvement in the surface and may be small changes in dimensions. Using Iodoflex which seems the only thing that will control  his surface 03/24/17- He is here for follow up evaluation of his LLE lateral ulceration and ulcer to right dorsal foot/toe space. He is voicing no complaints or concerns, He is tolerating compression wrap. 03/31/17 arrives today with [name] much healthier looking wound on the left lower extremity. We have been using Iodoflex for [name] prolonged period of time which has for the first time prepared and adequate looking wound bed although we have not had much in the way of wound dimension improvement. He also has [name] small wound between the first and second toe on the right 04/07/17; arrives today with [name] healthy-looking wound bed and at least the top 50% of this wound appears to be now her. No debridement was required I have changed him to [facility] last week after prolonged Iodoflex. He did not do well with Apligraf's. We've had [name] re-opening between the first and second toe on the right 04/14/17; arrives today with [name] healthier looking wound bed contractions and the top 50% of this wound and some on the lesser 50%. Wound bed appears healthy. The area between the first and second toe on the right still remains problematic 04/21/17; continued very gradual improvement. Using [facility] 04/28/17; continued very gradual improvement in the left lateral leg venous insufficiency wound. His periwound erythema is very mild. We have been using Hydrofera Blue. Wound is making progress especially in the superior 50% 05/05/17; he continues to have very gradual improvement in the left lateral venous insufficiency wound. Both in  terms with an length rings are improving. I debrided this every 2 weeks with #5 curet and we have been using Hydrofera Blue and again making good progress With regards to the wounds between his right first and second toe which I thought might of been tinea pedis he is not making as much progress very dry scaly skin over the area. Also the area at the base of the left first and second toe in [name] similar  condition 05/12/17; continued gradual improvement in the refractory left lateral venous insufficiency wound on the left. Dimension smaller. Surface still requiring debridement using Hydrofera Blue 05/19/17; continued gradual improvement in the refractory left lateral venous ulceration. Careful inspection of the wound bed underlying rumination suggested some degree of epithelialization over the surface no debridement indicated. Continue Hydrofera Blue difficult areas between his toes first and third on the left than first and second on the right. I'm going to change to silver alginate from silver collagen. Continue ketoconazole as I suspect underlying tinea pedis 05/26/17; left lateral leg venous insufficiency wound. We've been using Hydrofera Blue. I believe that there is expanding epithelialization over the surface of the wound albeit not coming from the wound circumference. This is [name] bit of an odd situation in which the epithelialization seems to be coming from the surface of the wound rather than in the exact circumference. There is still small open areas mostly along the lateral margin of the wound. He has unchanged areas between the left first and second and the right first second toes which I been treating for tenia pedis 06/02/17; left lateral leg venous insufficiency wound. We have been using Hydrofera Blue. Somewhat smaller from the wound circumference. The surface of the wound remains [name] bit on it almost epithelialized sedation in appearance. I use an open curette today debridement in the surface of all of this especially the edges Small open wounds remaining on the dorsal right first and second toe interspace and the plantar left first second toe and her face on the left 06/09/17; wound on the left lateral leg continues to be smaller but very gradual and very dry surface using Hydrofera Blue 06/16/17 requires weekly debridements now on the left lateral leg although this continues to contract. I  changed to silver collagen last week because of dryness of the wound bed. Using Iodoflex to the areas on his first and second toes/web space bilaterally 06/24/17; patient with history of paraplegia also chronic venous insufficiency with lymphedema. Has [name] very difficult wound on the left lateral leg. This has been gradually reducing in terms of with but comes in with [name] very dry adherent surface. High switch to silver collagen [name] week or so ago with hydrogel to keep the area moist. This is been refractory to multiple dressing attempts. He also has areas in his first and second toes bilaterally in the anterior and posterior web space. I had been using Iodoflex here after [name] prolonged course of silver alginate with ketoconazole was ineffective [question tinea pedis] 07/14/17; patient arrives today with [name] very difficult adherent material over his left lateral lower leg wound. He also has surrounding erythema and poorly controlled edema. He was switched his Santyl last visit which the nurses are applying once during his doctor visit and once on [name] nurse visit. He was also reduced to 2 layer compression I'm not exactly sure of the issue here. 07/21/17; better surface today after 1 week of Iodoflex. Significant cellulitis that we treated last week also better. [Doxycycline] 07/28/17 better  surface today with now 2 weeks of Iodoflex. Significant cellulitis treated with doxycycline. He has now completed the doxycycline and he is back to his usual degree of chronic venous inflammation/stasis dermatitis. He reminds me he has had ablations surgery here 08/04/17; continued improvement with Iodoflex to the left lateral leg wound in terms of the surface of the wound although the dimensions are better. 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 Problematic areas on the plantar aspect of the first second toe web space on the left and the dorsal aspect of the  first second toe web space on the right. At one point I felt these were probably related to chronic fungal infections in treated him aggressively for this although we have not made any improvement here. 08/11/17; left lateral leg. Surface continues to improve with the Iodoflex although we are not seeing much improvement in overall wound dimensions. Areas on his plantar left foot and right foot show no improvement. In fact the right foot looks somewhat worse 08/18/17; left lateral leg. We changed to [facility] Blue last week after [name] prolonged course of Iodoflex which helps get the surface better. It appears that the wound with is improved. Continue with difficult areas on the left dorsal first second and plantar first second on the right 09/01/17; patient arrives in clinic today having had [name] temperature of 103 yesterday. He was seen in the ER and [facility]. The patient was concerned he could have cellulitis again in the right leg however they diagnosed him with [name] UTI and he is now on Keflex. He has [name] history of cellulitis which is been recurrent and difficult but this is been in the left leg, in the past 5 use doxycycline. He does in and out catheterizations at home which are risk factors for UTI 09/08/17; patient will be completing his Keflex this weekend. The erythema on the left leg is considerably better. He has [name] new wound today on the medial part of the right leg small superficial almost looks like [name] skin tear. He has worsening of the area on the right dorsal first and second toe. His major area on the left lateral leg is better. Using Hydrofera Blue on all areas 09/15/17; gradual reduction in width on the long wound in the left lateral leg. No debridement required. He also has wounds on the plantar aspect of his left first second toe web space and on the dorsal aspect of the right first second toe web space. 09/22/17; there continues to be very gradual improvements in the dimensions of the left  lateral leg wound. He hasn't round erythematous spot with might be pressure on his wheelchair. There is no evidence obviously of infection no purulence no warmth He has [name] dry scaled area on the plantar aspect of the left first second toe Improved area on the dorsal right first second toe. 09/29/17; left lateral leg wound continues to improve in dimensions mostly with an is still [name] fairly long but increasingly narrow wound. He has [name] dry scaled area on the plantar aspect of his left first second toe web space Increasingly concerning area on the dorsal right first second toe. In fact I am concerned today about possible cellulitis around this wound. The areas extending up his second toe and although there is deformities here almost appears to abut on the nailbed. 10/06/17; left lateral leg wound continues to make very gradual progress. Tissue culture I did from the right first  second toe dorsal foot last time grew MRSA and enterococcus which was vancomycin sensitive. This was not sensitive to clindamycin or doxycycline. He is allergic to Zyvox and sulfa we have therefore arrange for him to have dalvance infusion tomorrow. He is had this in the past and tolerated it well 10/20/17; left lateral leg wound continues to make decent progress. This is certainly reduced in terms of with there is advancing epithelialization.The cellulitis in the right foot looks better although he still has [name] deep wound in the dorsal aspect of the first second toe web space. Plantar left first toe web space on the left I think is making some progress 10/27/17; left lateral leg wound continues to make decent progress. Advancing epithelialization.using Hydrofera Blue The right first second toe web space wound is better-looking using silver alginate Improvement in the left plantar first second toe web space. Again using silver alginate 11/03/17 left lateral leg wound continues to make decent progress albeit slowly. Using [facility] The right per second toe web space continues to be [name] very problematic looking punched out wound. I obtained [name] piece of tissue for deep culture I did extensively treated this for fungus. It is difficult to imagine that this is [name] pressure area as the patient states other than going outside he doesn't really wear shoes at home The left plantar first second toe web space looked fairly senescent. Necrotic edges. This required debridement change to [facility] Blue to all wound areas 11/10/17; left lateral leg wound continues to contract. Using Hydrofera Blue On the right dorsal first second toe web space dorsally. Culture I did of this area last week grew MRSA there is not an easy oral option in this patient was multiple antibiotic allergies or intolerances. This was only [name] rare culture isolate I'm therefore going to use Bactroban under silver alginate On the left plantar first second toe web space. Debridement is required here. This is also unchanged 11/17/17; left lateral leg wound continues to contract using Hydrofera Blue this is no longer the major issue. The major concern here is the right first second toe web space. He now has an open area going from dorsally to the plantar aspect. There is now wound on the inner lateral part of the first toe. Not [name] very viable surface on this. There is erythema spreading medially into the forefoot. No major change in the left first second toe plantar wound 11/24/17; left lateral leg wound continues to contract using Hydrofera Blue. Nice improvement today The right first second toe web space all of this looks [name] lot less angry than last week. I have given him clindamycin and topical Bactroban for MRSA and terbinafine for the possibility of underlining tinea pedis that I could not control with ketoconazole. Looks somewhat better The area on the plantar left first second toe web space is weeping with dried debris around the wound 12/01/17; left lateral leg wound  continues to contract he Hydrofera Blue. It is becoming thinner in terms of with nevertheless it is making good improvement. The right first second toe web space looks less angry but still [name] large necrotic-looking wounds starting on the plantar aspect of the right foot extending between the toes and now extensively on the base of the right second toe. I gave him clindamycin and topical Bactroban for MRSA anterior benefiting for the possibility of underlying tinea pedis. Not looking better today The area on the left first/second toe looks better. Debrided of necrotic debris 12/05/17* the patient was  worked in urgently today because over the weekend he found blood on his incontinence bad when he woke up. He was found to have an ulcer by his wife who does most of his wound care. He came in today for [place] to look at this. He has not had [name] history of wounds in his buttocks in spite of his paraplegia. 12/08/17; seen in follow-up today at his usual appointment. He was seen earlier this week and found to have [name] new wound on his buttock. We also follow him for wounds on the left lateral leg, left first second toe web space and right first second toe web space 12/15/17; we have been using Hydrofera Blue to the left lateral leg which has improved. The right first second toe web space has also improved. Left first second toe web space plantar aspect looks stable. The left buttock has worsened using Santyl. Apparently the buttock has drainage 12/22/17; we have been using Hydrofera Blue to the left lateral leg which continues to improve now 2 small wounds separated by normal skin. He tells [place] he had [name] fever up to 100 yesterday he is prone to UTIs but has not noted anything different. He does in and out catheterizations. The area between the first and second toes today does not look good necrotic surface covered with what looks to be purulent drainage and erythema extending into the third toe. I had gotten this to  something that I thought look better last time however it is not look good today. He also has [name] necrotic surface over the buttock wound which is expanded. I thought there might be infection under here so I removed [name] lot of the surface with [name] #5 curet though nothing look like it really needed culturing. He is been using Santyl to this area 12/27/17; his original wound on the left lateral leg continues to improve using Hydrofera Blue. I gave him samples of Baxdella although he was unable to take them out of fear for an allergic reaction ["lump in his throat"].the culture I did of the purulent drainage from his second toe last week showed both enterococcus and [name] set Enterobacter I was also concerned about the erythema on the bottom of his foot although paradoxically although this looks somewhat better today. 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 We've been using silver alginate here. They came up today that he sleeps in [name] recliner, I'm not sure why but I asked him to stop this 01/03/18; his original wound we've been using Hydrofera Blue is now separated into 2 areas. Ulcer on his left buttock is better he is off the recliner and sleeping in bed Finally both wound areas between his first and second toes also looks some better 01/10/18; his original wound on the left lateral leg is now separated into 2 wounds we've been using Hydrofera Blue Ulcer on his left buttock has some drainage. There is [name] small probing site going into muscle layer superiorly.using silver alginate -He arrives today with [name] deep tissue injury on the left heel The wound on the dorsal aspect of his first second toe on the left looks [name] lot betterusing silver alginate ketoconazole The area on the first second toe web space on the right also looks [name] lot bette 01/17/18; his original wound on the left lateral leg continues to progress using  Hydrofera Blue Ulcer on his left buttock also is smaller surface  healthier except for [name] small probing site going into the muscle layer superiorly. 2.4 cm of tunneling in this area DTI on his left heel we have only been offloading. Looks better than last week no threatened open no evidence of infection the wound on the dorsal aspect of the first second toe on the left continues to look like it's regressing we have only been using silver alginate and terbinafine orally The area in the first second toe web space on the right also looks to be [name] lot better using silver alginate and terbinafine I think this was prompted by tinea pedis 01/31/18; the patient was hospitalized in [place] last week apparently for [name] complicated UTI. He was discharged on cefepime he does in and out catheterizations. In the hospital he was discovered M I don't mild elevation of AST and ALT and the terbinafine was stopped.predictably the pressure ulcer on s his buttock looks betterusing silver alginate. The area on the left lateral leg also is better using Hydrofera Blue. The area between the first and second toes on the left better. First and second toes on the right still substantial but better. Finally the DTI on the left heel has held together and looks like it's resolving 02/07/18-he is here in follow-up evaluation for multiple ulcerations. He has new injury to the lateral aspect of the last issue [name] pressure ulcer, he states this is from adhesive removal trauma. He states he has tried multiple adhesive products with no success. All other ulcers appear stable. The left heel DTI is resolving. We will continue with same treatment plan and follow-up next week. 02/14/18; follow-up for multiple areas. He has [name] new area last week on the lateral aspect of his pressure ulcer more over the posterior trochanter. The original pressure ulcer looks quite stable has healthy granulation. 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 Almost fully epithelialized on the original superior area using Hydrofera Blue DTI on the left heel has peeled off this week to reveal [name] small superficial wound under denuded skin and subcutaneous tissue Both areas between the first and second toes look better including nothing open on the left 02/21/18; The patient's wounds on his left ischial tuberosity and posterior left greater trochanter actually looked better. He has [name] large area of irritation around the area which I think is contact dermatitis. I am doubtful that this is fungal His original wound on the left lateral calf continues to improve we have been using Hydrofera Blue There is no open area in the left first second toe web space although there is [name] lot of thick callus The DTI on the left heel required debridement today of necrotic surface eschar and subcutaneous tissue using silver alginate Finally the area on the right first second toe webspace continues to contract using silver alginate and ketoconazole 02/28/18 Left ischial tuberosity wounds look better using silver alginate. Original wound on the left calf only has one small open area left using Hydrofera Blue DTI on the left heel required debridement mostly removing skin from around this wound surface. Using silver alginate The areas on the right first/second toe web space using silver alginate and ketoconazole 03/08/18 on evaluation today patient appears to be doing decently well as best I can tell in regard to his wounds. This is the first time that I have seen him as he generally is followed by Dr. Dellia Nims. With that being said  none of his wounds appear to be infected he does have an area where there is some skin covering what appears to be [name] new wound on the left dorsal surface of his great toe. This is right at the nail bed. With that being said I do believe that debrided away some of  the excess skin can be of benefit in this regard. Otherwise he has been tolerating the dressing changes without complication. 03/14/18; patient arrives today with the multiplicity of wounds that we are following. He has not been systemically unwell Original wound on the left lateral calf now only has 2 small open areas we've been using Hydrofera Blue which should continue The deep tissue injury on the left heel requires debridement today. We've been using silver alginate The left first second toe and the right first second toe are both are reminiscence what I think was tinea pedis. Apparently some of the callus Surface between the toes was removed last week when it started draining. Purulent drainage coming from the wound on the ischial tuberosity on the left. 03/21/18-He is here in follow-up evaluation for multiple wounds. 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 He tolerated debridement. The only change to last week's recommendations is to discontinue antifungal cream between toes. He will follow-up next week 03/28/18; following up for multiple wounds;Concern this week is streaking redness and swelling in the right foot. He is going to need antibiotics for this. 03/31/18; follow-up for right foot cellulitis. Streaking redness and swelling in the right foot on 03/28/18. 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 I put him on clindamycin 300 mg every 6 and brought him in for [name] quick check. He has an open wound between his first and second toes on the right foot as [name] potential source. 04/04/18; Right foot cellulitis is resolving he is completing clindamycin. This is truly good news Left lateral calf wound which is initial wound only has one small open area inferiorly this is close to healing out. He has compression stockings. We will use Hydrofera Blue right down to the epithelialization of this Nonviable  surface on the left heel which was initially pressure with [name] DTI. We've been using Hydrofera Blue. I'm going to switch this back to silver alginate Left first second toe/tinea pedis this looks better using silver alginate Right first second toe tinea pedis using silver alginate Large pressure ulcers on theLeft ischial tuberosity. Small wound here Looks better. I am uncertain about the surface over the large wound. Using silver alginate 04/11/18; Cellulitis in the right foot is resolved Left lateral calf wound which was his original wounds still has 2 tiny open areas remaining this is just about closed Nonviable surface on the left heel is better but still requires debridement Left first second toe/tinea pedis still open using silver alginate Right first second toe wound tinea pedis I asked him to go back to using ketoconazole and silver alginate Large pressure ulcers on the left ischial tuberosity this shear injury here is resolved. Wound is smaller. No evidence of infection using silver alginate 04/18/18; Patient arrives with an intense area of cellulitis in the right mid lower calf extending into the right heel area. Bright red and warm. Smaller area on the left anterior leg. He has [name] significant history of MRSA. He will definitely need antibioticsdoxycycline He now has 2 open areas on the left ischial tuberosity the original large wound and now [name] satellite area  which I think was above his initial satellite areas. Not [name] wonderful surface on this satellite area surrounding erythema which looks like pressure related. His left lateral calf wound again his original wound is just about closed Left heel pressure injury still requiring debridement Left first second toe looks [name] lot better using silver alginate Right first second toe also using silver alginate and ketoconazole cream also looks better 04/20/18; the patient was worked in early today out of concerns with his cellulitis on the right leg. I  had started him on doxycycline. This was 2 days ago. His wife was concerned about the swelling in the area. Also concerned about the left buttock. He has not been systemically unwell no fever chills. No nausea vomiting or diarrhea 04/25/18; the patient's left buttock wound is continued to deteriorate he is using Hydrofera Blue. He is still completing clindamycin for the cellulitis on the right leg although all of this looks better. 05/02/18 Left buttock wound still with [name] lot of drainage and [name] very tightly adherent fibrinous necrotic surface. He has [name] deeper area superiorly The left lateral calf wound is still closed DTI wound on the left heel necrotic surface especially the circumference using Iodoflex Areas between his left first second toe and right first second toe both look better. Dorsally and the right first second toe he had [name] necrotic surface although at smaller. In using silver alginate and ketoconazole. I did [name] culture last week which was [name] deep tissue culture of the reminiscence of the open wound on the right first second toe dorsally. This grew [name] few Acinetobacter and [name] few methicillin-resistant staph aureus. Nevertheless the area actually this week looked better. I didn't feel the need to specifically address this at least in terms of systemic antibiotics. 05/09/18; wounds are measuring larger more drainage per our intake. We are using Santyl covered with alginate on the large superficial buttock wounds, Iodosorb on the left heel, ketoconazole and silver alginate to the dorsal first and second toes bilaterally. 05/16/18; The area on his left buttock better in some aspects although the area superiorly over the ischial tuberosity required an extensive debridement.using Santyl Left heel appears stable. Using Iodoflex The areas between his first and second toes are not bad however there is spreading erythema up the dorsal aspect of his left foot this looks like cellulitis again. He is  insensate the erythema is really very brilliant.o Erysipelas He went to see an allergist days ago because he was itching part of this he had lab work done. This showed [name] white count of 15.1 with 70% neutrophils. Hemoglobin of 11.4 and [name] platelet count of 659,000. Last white count we had in Epic was [name] 2-1/2 years ago which was 25.9 but he was ill at the time. He was able to show me some lab work that was done by his primary physician the pattern is about the same. 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 But prompted me to go ahead and do x-rays of both feet and the pelvis rule out osteomyelitis. He also had [name] comprehensive metabolic panel this was reasonably normal his albumin was 3.7 liver function tests BUN/creatinine all normal 05/23/18; x-rays of both his feet from last week were negative for underlying pulmonary abnormality. The x-ray of his pelvis however showed mild irregularity in the left ischial which may represent some early osteomyelitis. The wound in the left ischial continues to get deeper clearly now exposed muscle.  Each week necrotic surface material over this area. Whereas the rest of the wounds do not look so bad. The left ischial wound we have been using Santyl and calcium alginate T the left heel surface necrotic debris using Iodoflex o The left lateral leg is still healed Areas on the left dorsal foot and the right dorsal foot are about the same. There is some inflammation on the left which might represent contact dermatitis, fungal dermatitis I am doubtful cellulitis although this looks better than last week 05/30/18; CT scan done at Hospital did not show any osteomyelitis or abscess. Suggested the possibility of underlying cellulitis although I don't see [name] lot of evidence of this at the bedside The wound itself on the left buttock/upper thigh actually looks somewhat better. No debridement Left heel also looks better no debridement  continue Iodoflex Both dorsal first second toe spaces appear better using Lotrisone. Left still required debridement 06/06/18; Intake reported some purulent looking drainage from the left gluteal wound. Using Santyl and calcium alginate Left heel looks better although still [name] nonviable surface requiring debridement The left dorsal foot first/second webspace actually expanding and somewhat deeper. I may consider doing [name] shave biopsy of this area Right dorsal foot first/second webspace appears stable to improved. Using Lotrisone and silver alginate to both these areas 06/13/18 Left gluteal surface looks better. Now separated in the 2 wounds. No debridement required. Still drainage. We'll continue silver alginate Left heel continues to look better with Iodoflex continue this for at least another week Of his dorsal foot wounds the area on the left still has some depth although it looks better than last week. We've been using Lotrisone and silver alginate 06/20/18 Left gluteal continues to look better healthy tissue Left heel continues to look better healthy granulation wound is smaller. He is using Iodoflex and his long as this continues continue the Iodoflex Dorsal right foot looks better unfortunately dorsal left foot does not. There is swelling and erythema of his forefoot. He had minor trauma to this several days ago but doesn't think this was enough to have caused any tissue injury. Foot looks like cellulitis, we have had this problem before 06/27/18 on evaluation today patient appears to be doing [name] little worse in regard to his foot ulcer. 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 Both of which would really be his only options for treating this infection. In the past he has been given and effusion of Orbactiv. This is done very well for him in the past again  it's one time dosing IV antibiotic therapy. Subsequently I do believe this is something we're gonna need to see about doing at this point in time. Currently his other wounds seem to be doing somewhat better in my pinion I'm pretty happy in that regard. 07/03/18 on evaluation today patient's wounds actually appear to be doing fairly well. He has been tolerating the dressing changes without complication. All in all he seems to be showing signs of improvement. In regard to the antibiotics he has been dealing with infectious disease since I saw him last week as far as getting this scheduled. In the end [facility]'s [facility] this coming Friday. In the meantime he has been continuing to perform the dressing changes in such as previous. There does not appear  to be any evidence of infection worsengin at this time. 07/10/18; Since I last saw this man 2 weeks ago things have actually improved. IV antibiotics of resulted in less forefoot erythema although there is still some present. He is not systemically unwell Left buttock wounds 2 now have no depth there is increased epithelialization Using silver alginate Left heel still requires debridement using Iodoflex Left dorsal foot still with [name] sizable wound about the size of [name] border but healthy granulation Right dorsal foot still with [name] slitlike area using silver alginate 07/18/18; the patient's cellulitis in the left foot is improved in fact I think it is on its way to resolving. Left buttock wounds 2 both look better although the larger one has hypertension granulation we've been using silver alginate Left heel has some thick circumferential redundant skin over the wound edge which will need to be removed today we've been using Iodoflex Left dorsal foot is still [name] sizable wound required debridement using silver alginate The right dorsal foot is just about closed only [name] small open area remains here 07/25/18;  left foot cellulitis is resolved Left buttock wounds 2 both look better. Hyper-granulation on the major area Left heel as some debris over the surface but otherwise looks [name] healthier wound. Using silver collagen Right dorsal foot is just about closed 07/31/18; arrives with our intake nurse worried about purulent drainage from the buttock. We had hyper-granulation here last week His buttock wounds 2 continue to look better Left heel some debris over the surface but measuring smaller. Right dorsal foot unfortunately has openings between the toes Left foot superficial wound looks less aggravated. 08/07/18 Buttock wounds continue to look better although some of her granulation and the larger medial wound. silver alginate Left heel continues to look [name] lot better.silver collagen Left foot superficial wound looks less stable. Requires debridement. He has [name] new wound superficial area on the foot on the lateral dorsal foot. Right foot looks better using silver alginate without Lotrisone 08/14/2018; patient was in the ER last week diagnosed with [name] UTI. He is now on Cefpodoxime and Macrodantin. Buttock wounds continued to be smaller. Using silver alginate Left heel continues to look better using silver collagen Left foot superficial wound looks as though it is improving Right dorsal foot area is just about healed. 08/21/2018; patient is completed his antibiotics for his UTI. He has 2 open areas on the buttocks. There is still not closed although the surface looks satisfactory. Using silver alginate Left heel continues to improve using silver collagen The bilateral dorsal foot areas which are at the base of his first and second toes/possible tinea pedis are actually stable on the left but worse on the right. The area on the left required debridement of necrotic surface. After debridement I obtained [name] specimen for PCR culture. The right dorsal foot which is been just about healed last week is now  reopened 08/28/2018; culture done on the left dorsal foot showed coag negative staph both staph epidermidis and Lugdunensis. I think this is worthwhile initiating systemic treatment. I will use doxycycline given his long list of allergies. The area on the left heel slightly improved but still requiring debridement. The large wound on the buttock is just about closed whereas the smaller one is larger. Using silver alginate in this area 09/04/2018; patient is completing his doxycycline for the left foot although this continues to be [name] very difficult wound area with very adherent necrotic debris. We are using silver alginate to all his  wounds right foot left foot and the small wounds on his buttock, silver collagen on the left heel. 09/11/2018; once again this patient has intense erythema and swelling of the left forefoot. Lesser degrees of erythema in the right foot. He has [name] long list of allergies and intolerances. I will reinstitute doxycycline. 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 Using silver alginate Left heel also looks better using silver collagen Unfortunately both the areas on his feet look worse. The area on the left first second webspace is now gone through to the plantar part of his foot. The area on the left foot anteriorly is irritated with erythema and swelling in the forefoot. 09/25/2018 His wound on the left plantar heel looks better. Using silver collagen The area on the left buttock 2 small remnant areas. One is closed one is still open. Using silver alginate The areas between both his first and second toes look worse. This in spite of long-standing antifungal therapy with ketoconazole and silver alginate which should have antifungal activity He has small areas around his original wound on the left calf one is on the bottom of the original scar tissue and one superiorly both of these are small and superficial but again given wound  history in this site this is worrisome 10/02/2018 Left plantar heel continues to gradually contract using silver collagen Left buttock wound is unchanged using silver alginate The areas on his dorsal feet between his first and second toes bilaterally look about the same. I prescribed clindamycin ointment to see if we can address chronic staph colonization and also the underlying possibility of erythrasma The left lateral lower extremity wound is actually on the lateral part of his ankle. Small open area here. We have been using silver alginate 10/09/2018; Left plantar heel continues to look healthy and contract. No debridement is required Left buttock slightly smaller with [name] tape injury wound just below which was new this week Dorsal feet somewhat improved I have been using clindamycin Left lateral looks lower extremity the actual open area looks worse although [name] lot of this is epithelialized. I am going to change to silver collagen today He has [name] lot more swelling in the right leg although this is not pitting not red and not particularly warm there is [name] lot of spasm in the right leg usually indicative of people with paralysis of some underlying discomfort. We have reviewed his vascular status from 2017 he had [name] left greater saphenous vein ablation. I wonder about referring him back to vascular surgery if the area on the left leg continues to deteriorate. 10/16/2018 in today for follow-up and management of multiple lower extremity ulcers. His left Buttock wound is much lower smaller and almost closed completely. The wound to the left ankle has began to reopen with Epithelialization and some adherent slough. He has multiple new areas to the left foot and leg. The left dorsal foot without much improvement. Wound present between left great webspace and 2nd toe. Erythema and edema present right leg. Right LE ultrasound obtained on 10/10/18 was negative for DVT . 10/23/2018; Left buttock is closed  over. Still dry macerated skin but there is no open wound. I suspect this is chronic pressure/moisture Left lateral calf is quite [name] bit worse than when I saw this last. There is clearly drainage here he has macerated skin into the left plantar heel. We will change the primary dressing to alginate Left dorsal foot  has some improvement in overall wound area. Still using clindamycin and silver alginate Right dorsal foot about the same as the left using clindamycin and silver alginate The erythema in the right leg has resolved. He is DVT rule out was negative Left heel pressure area required debridement although the wound is smaller and the surface is health 10/26/2018 The patient came back in for his nurse check today predominantly because of the drainage coming out of the left lateral leg with [name] recent reopening of his original wound on the left lateral calf. He comes in today with [name] large amount of surrounding erythema around the wound extending from the calf into the ankle and even in the area on the dorsal foot. He is not systemically unwell. He is not febrile. Nevertheless this looks like cellulitis. We have been using silver alginate to the area. I changed him to [name] regular visit and I am going to prescribe him doxycycline. The rationale here is [name] long list of medication intolerances and [name] history of MRSA. I did not see anything that I thought would provide [name] valuable culture 10/30/2018 Follow-up from his appointment 4 days ago with really an extensive area of cellulitis in the left calf left lateral ankle and left dorsal foot. I put him on doxycycline. He has [name] long list of medication allergies which are true allergy reactions. 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 In any case he is [name] lot better today. The erythema especially in the anterior and lateral left calf is better. He still has left ankle erythema. He also is  complaining about increasing edema in the right leg we have only been using Kerlix Coban and he has been doing the wraps at home. Finally he has [name] spotty rash on the medial part of his upper left calf which looks like folliculitis or perhaps wrap occlusion type injury. Small superficial macules not pustules 11/06/18 patient arrives today with again [name] considerable degree of erythema around the wound on the left lateral calf extending into the dorsal ankle and dorsal foot. This is [name] lot worse than when I saw this last week. He is on doxycycline really with not [name] lot of improvement. He has not been systemically unwell Wounds on the; left heel actually looks improved. Original area on the left foot and proximity to the first and second toes looks about the same. He has superficial areas on the dorsal foot, anterior calf and then the reopening of his original wound on the left lateral calf which looks about the same The only area he has on the right is the dorsal webspace first and second which is smaller. He has [name] large area of dry erythematous skin on the left buttock small open area here. 11/13/2018; the patient arrives in much better condition. The erythema around the wound on the left lateral calf is [name] lot better. Not sure whether this was the clindamycin or the TCA and ketoconazole or just in the improvement in edema control [stasis dermatitis]. In any case this is [name] lot better. The area on the left heel is very small and just about resolved using silver collagen we have been using silver alginate to the areas on his dorsal feet 11/20/2018; his wounds include the left lateral calf, left heel, dorsal aspects of both feet just proximal to the first second webspace. He is stable to slightly improved. I did not think any changes to his dressings  were going to be necessary 11/27/2018 he has [name] reopening on the left buttock which is surrounded by what looks like tinea or perhaps some other form of dermatitis.  The area on the left dorsal foot has some erythema around it I have marked this area but I am not sure whether this is cellulitis or not. Left heel is not closed. Left calf the reopening is really slightly longer and probably worse 1/13; in general things look better and smaller except for the left dorsal foot. Area on the left heel is just about closed, left buttock looks better only [name] small wound remains in the skin looks better [using Lotrisone] 1/20; the area on the left heel only has [name] few remaining open areas here. Left lateral calf about the same in terms of size, left dorsal foot slightly larger right lateral foot still not closed. The area on the left buttock has no open wound and the surrounding skin looks [name] lot better 1/27; the area on the left heel is closed. Left lateral calf better but still requiring extensive debridements. The area on his left buttock is closed. He still has the open areas on the left dorsal foot which is slightly smaller in the right foot which is slightly expanded. We have been using Iodoflex on these areas as well 2/3; left heel is closed. Left lateral calf still requiring debridement using Iodoflex there is no open area on his left buttock however he has dry scaly skin over [name] large area of this. Not really responding well to the Lotrisone. Finally the areas on his dorsal feet at the level of the first second webspace are slightly smaller on the right and about the same on the left. Both of these vigorously debrided with Anasept and gauze 2/10; left heel remains closed he has dry erythematous skin over the left buttock but there is no open wound here. Left lateral leg has come in and with. Still requiring debridement we have been using Iodoflex here. Finally the area on the left dorsal foot and right dorsal foot are really about the same extremely dry callused fissured areas. He does not yet have [name] dermatology appointment 2/17; left heel remains closed. He has [name] new  open area on the left buttock. The area on the left lateral calf is bigger longer and still covered in necrotic debris. No major change in his foot areas bilaterally. I am awaiting for [name] dermatologist to look on this. We have been using ketoconazole I do not know that this is been doing any good at all. 2/24; left heel remains closed. The left buttock wound that was new reopening last week looks better. The left lateral calf appears better also although still requires debridement. The major area on his foot is the left first second also requiring debridement. We have been putting Prisma on all wounds. I do not believe that the ketoconazole has done too much good for his feet. He will use Lotrisone I am going to give him [name] 2-week course of terbinafine. We still do not have [name] dermatology appointment 3/2 left heel remains closed however there is skin over bone in this area I pointed this out to him today. The left buttock wound is epithelialized but still does not look completely stable. The area on the left leg required debridement were using silver collagen here. With regards to his feet we changed to Lotrisone last week and silver alginate. 3/9; left heel remains closed. Left buttock remains closed. The area  on the right foot is essentially closed. The left foot remains unchanged. Slightly smaller on the left lateral calf. Using silver collagen to both of these areas 3/16-Left heel remains closed. Area on right foot is closed. Left lateral calf above the lateral malleolus open wound requiring debridement with easy bleeding. Left dorsal wound proximal to first toe also debrided. Left ischial area open new. Patient has been using Prisma with wrapping every 3 days. Dermatology appointment is apparently tomorrow.Patient has completed his terbinafine 2-week course with some apparent improvement according to him, there is still flaking and dry skin in his foot on the left 3/23; area on the right foot is  reopened. The area on the left anterior foot is about the same still [name] very necrotic adherent surface. He still has the area on the left leg and reopening is on the left buttock. He apparently saw dermatology although I do not have [name] note. According to the patient who is usually fairly well informed they did not have any good ideas. Put him on oral terbinafine which she is been on before. 3/30; using silver collagen to all wounds. Apparently his dermatologist put him on doxycycline and rifampin presumably some culture grew staph. I do not have this result. He remains on terbinafine although I have used terbinafine on him before 4/6; patient has had [name] fairly substantial reopening on the right foot between the first and second toes. He is finished his terbinafine and I believe is on doxycycline and rifampin still as prescribed by dermatology. We have been using silver collagen to all his wounds although the patient reports that he thinks silver alginate does better on the wounds on his buttock. 4/13; the area on his left lateral calf about the same size but it did not require debridement. Left dorsal foot just proximal to the webspace between the first and second toes is about the same. Still nonviable surface. I note some superficial bronze discoloration of the dorsal part of his foot Right dorsal foot just proximal to the first and second toes also looks about the same. I still think there may be the same discoloration I noted above on the left Left buttock wound looks about the same 4/20; left lateral calf appears to be gradually contracting using silver collagen. 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 The left dorsal foot wound is debrided of tightly adherent necrotic debris and really cleans up quite nicely. The right area is worse with expansion. I did not debride this it is now over the base of the second toe The area on his left  buttock is smaller no debridement is required using silver collagen 5/4; left calf continues to make good progress. He arrives with erythema around the wounds on his dorsal foot which even extends to the plantar aspect. Very concerning for coexistent infection. He is finished the erythromycin I gave him for possible erythrasma this does not seem to have helped. The area on the left foot is about the same base of the dorsal toes Is area on the buttock looks improved on the left 5/11; left calf and left buttock continued to make good progress. Left foot is about the same to slightly improved. Major problem is on the right foot. He has not had an x-ray. Deep tissue culture I did last week showed both Enterobacter and E. coli. I did not change the doxycycline I put him on empirically although neither 1 of these were plated to doxycycline.  He arrives today with the erythema looking worse on both the dorsal and plantar foot. Macerated skin on the bottom of the foot. he has not been systemically unwell 5/18-Patient returns at 1 week, left calf wound appears to be making some progress, left buttock wound appears slightly worse than last time, left foot wound looks slightly better, right foot redness is marginally better. X-ray of both feet show no air or evidence of osteomyelitis. Patient is finished his Omnicef and terbinafine. He continues to have macerated skin on the bottom of the left foot as well as right 5/26; left calf wound is better, left buttock wound appears to have multiple small superficial open areas with surrounding macerated skin. X-rays that I did last time showed no evidence of osteomyelitis in either foot. He is finished cefdinir and doxycycline. I do not think that he was on terbinafine. He continues to have [name] large superficial open area on the right foot anterior dorsal and slightly between the first and second toes. I did send him to dermatology 2 months ago or so wondering about  whether they would do [name] fungal scraping. I do not believe they did but did do [name] culture. We have been using silver alginate to the toe areas, he has been using antifungals at home topically either ketoconazole or Lotrisone. We are using silver collagen on the left foot, silver alginate on the right, silver collagen on the left lateral leg and silver alginate on the left buttock 6/1; left buttock area is healed. We have the left dorsal foot, left lateral leg and right dorsal foot. We are using silver alginate to the areas on both feet and silver collagen to the area on his left lateral calf 6/8; the left buttock apparently reopened late last week. He is not really sure how this happened. He is tolerating the terbinafine. Using silver alginate to all wounds 6/15; left buttock wound is larger than last week but still superficial. Came in the clinic today with [name] report of purulence from the left lateral leg I did not identify any infection Both areas on his dorsal feet appear to be better. He is tolerating the terbinafine. Using silver alginate to all wounds 6/22; left buttock is about the same this week, left calf quite [name] bit better. His left foot is about the same however he comes in with erythema and warmth in the right forefoot once again. Culture that I gave him in the beginning of May showed Enterobacter and E. coli. I gave him doxycycline and things seem to improve although neither 1 of these organisms was specifically plated. 6/29; left buttock is larger and dry this week. Left lateral calf looks to me to be improved. Left dorsal foot also somewhat improved right foot completely unchanged. The erythema on the right foot is still present. He is completing the Ceftin dinner that I gave him empirically [see discussion above.) 7/6 - All wounds look to be stable and perhaps improved, the left buttock wound is slightly smaller, per patient bleeds easily, completed ceftin, the right foot redness is  less, he is on terbinafine 7/13; left buttock wound about the same perhaps slightly narrower. Area on the left lateral leg continues to narrow. Left dorsal foot slightly smaller right foot about the same. We are using silver alginate on the right foot and Hydrofera Blue to the areas on the left. Unna boot on the left 2 layer compression on the right 7/20; left buttock wound absolutely the same. Area on lateral  leg continues to get better. Left dorsal foot require debridement as did the right no major change in the 7/27; left buttock wound the same size necrotic debris over the surface. The area on the lateral leg is closed once again. His left foot looks better right foot about the same although there is some involvement now of the posterior first second toe area. He is still on terbinafine which I have given him for [name] month, not certain [name] centimeter major change 06/25/19-All wounds appear to be slightly improved according to report, left buttock wound looks clean, both foot wounds have minimal to no debris the right dorsal foot has minimal slough. We are using Hydrofera Blue to the left and silver alginate to the right foot and ischial wound. 8/10-Wounds all appear to be around the same, the right forefoot distal part has some redness which was not there before, however the wound looks clean and small. Ischial wound looks about the same with no changes 8/17; his wound on the left lateral calf which was his original chronic venous insufficiency wound remains closed. Since I last saw him the areas on the left dorsal foot right dorsal foot generally appear better but require debridement. The area on his left initial tuberosity appears somewhat larger to me perhaps hyper granulated and bleeds very easily. We have been using Hydrofera Blue to the left dorsal foot and silver alginate to everything else 8/24; left lateral calf remains closed. The areas on his dorsal feet on the webspace of the first and  second toes bilaterally both look better. The area on the left buttock which is the pressure ulcer stage II slightly smaller. I change the dressing to Hydrofera Blue to all areas 8/31; left lateral calf remains closed. The area on his dorsal feet bilaterally look better. Using Hydrofera Blue. Still requiring debridement on the left foot. No change in the left buttock pressure ulcers however 9/14; left lateral calf remains closed. Dorsal feet look quite [name] bit better than 2 weeks ago. Flaking dry skin also [name] lot better with the ammonium lactate I gave him 2 weeks ago. The area on the left buttock is improved. He states that his Roho cushion developed [name] leak and he is getting [name] new one, in the interim he is offloading this vigorously 9/21; left calf remains closed. Left heel which was [name] possible DTI looks better this week. He had macerated tissue around the left dorsal foot right foot looks satisfactory and improved left buttock wound. I changed his dressings to his feet to silver alginate bilaterally. Continuing Hydrofera Blue on the left buttock. 9/28 left calf remains closed. Left heel did not develop anything [possible DTI] dry flaking skin on the left dorsal foot. Right foot looks satisfactory. Improved left buttock wound. We are using silver alginate on his feet Hydrofera Blue on the buttock. I have asked him to go back to the Lotrisone on his feet including the wounds and surrounding areas 10/5; left calf remains closed. The areas on the left and right feet about the same. [name] lot of this is epithelialized however debris over the remaining open areas. He is using Lotrisone and silver alginate. The area on the left buttock using Hydrofera Blue 10/26. Patient has been out for 3 weeks secondary to Covid concerns. He tested negative but I think his wife tested positive. He comes in today with the left foot substantially worse, right foot about the same. Even more concerning he states that the area on  his  left buttock closed over but then reopened and is considerably deeper in one aspect than it was before [stage III wound] 11/2; left foot really about the same as last week. Quarter sized wound on the dorsal foot just proximal to the first second toes. Surrounding erythema with areas of denuded epithelium. This is not really much different looking. Did not look like cellulitis this time however. Right foot area about the same.. We have been using silver alginate alginate on his toes Left buttock still substantial irritated skin around the wound which I think looks somewhat better. We have been using Hydrofera Blue here. 11/9; left foot larger than last week and [name] very necrotic surface. Right foot I think is about the same perhaps slightly smaller. Debris around the circumference also addressed. Unfortunately on the left buttock there is been [name] decline. Satellite lesions below the major wound distally and now [name] an additional one posteriorly we have been using Hydrofera Blue but I think this is [name] pressure issue 11/16; left foot ulcer dorsally again [name] very adherent necrotic surface. Right foot is about the same. Not much change in the pressure ulcer on his left buttock. 11/30; left foot ulcer dorsally basically the same as when I saw him 2 weeks ago. Very adherent fibrinous debris on the wound surface. Patient reports [name] lot of drainage as well. 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 We have been using Iodoflex, patient changed back to alginate because of the drainage. Area on his right dorsal foot really looks benign with [name] healthier surface certainly [name] lot better than on the left. Left buttock wounds all improved using Hydrofera Blue 12/7; left dorsal foot again no improvement. Tightly adherent debris. PCR culture I did last week only showed likely skin contaminant. I have gone ahead and done [name] punch biopsy of this which is about the last thing in  terms of investigations I can think to do. He has known venous insufficiency and venous hypertension and this could be the issue here. The area on the right foot is about the same left buttock slightly worse according to our intake nurse secondary to [facility] Blue sticking to the wound 12/14; biopsy of the left foot that I did last time showed changes that could be related to wound healing/chronic stasis dermatitis phenomenon no neoplasm. We have been using silver alginate to both feet. I change the one on the left today to Sorbact and silver alginate to his other 2 wounds 12/28; the patient arrives with the following problems; Major issue is the dorsal left foot which continues to be [name] larger deeper wound area. Still with [name] completely nonviable surface Paradoxically the area mirror image on the right on the right dorsal foot appears to be getting better. He had some loss of dry denuded skin from the lower part of his original wound on the left lateral calf. Some of this area looked [name] little vulnerable and for this reason we put him in wrap that on this side this week The area on his left buttock is larger. He still has the erythematous circular area which I think is [name] combination of pressure, sweat. This does not look like cellulitis or fungal dermatitis 11/26/2019; -Dorsal left foot large open wound with depth. Still debris over the surface. Using Sorbact The area on the dorsal right foot paradoxically has closed over He has [name] reopening on the left ankle laterally at the base of his original wound that  extended up into the calf. This appears clean. The left buttock wound is smaller but with very adherent necrotic debris over the surface. We have been using silver alginate here as well The patient had arterial studies done in 2017. He had biphasic waveforms at the dorsalis pedis and posterior tibial bilaterally. ABI in the left was 1.17. Digit waveforms were dampened. He has slight spasticity in  the great toes I do not think [name] TBI would be possible 1/11; the patient comes in today with [name] sizable reopening between the first and second toes on the right. This is not exactly in the same location where we have been treating wounds previously. According to our intake nurse this was actually fairly deep but 0.6 cm. The area on the left dorsal foot looks about the same the surface is somewhat cleaner using Sorbact, his MRI is in 2 days. We have not managed yet to get arterial studies. The new reopening on the left lateral calf looks somewhat better using alginate. The left buttock wound is about the same using alginate 1/18; the patient had his ARTERIAL studies which were quite normal. ABI in the right at 1.13 with triphasic/biphasic waveforms on the left ABI 1.06 again with triphasic/biphasic waveforms. It would not have been possible to have done [name] toe brachial index because of spasticity. We have been using Sorbac to the left foot alginate to the rest of his wounds on the right foot left lateral calf and left buttock 1/25; arrives in clinic with erythema and swelling of the left forefoot worse over the first MTP area. This extends laterally dorsally and but also posteriorly. Still has an area on the left lateral part of the lower part of his calf wound it is eschared and clearly not closed. Area on the left buttock still with surrounding irritation and erythema. Right foot surface wound dorsally. The area between the right and first and second toes appears better. 2/1; The left foot wound is about the same. Erythema slightly better I gave him [name] week of doxycycline empirically Right foot wound is more extensive extending between the toes to the plantar surface Left lateral calf really no open surface on the inferior part of his original wound however the entire area still looks vulnerable Absolutely no improvement in the left buttock wound required debridement. 2/8; the left foot is about the  same. Erythema is slightly improved I gave him clindamycin last week. Right foot looks better he is using Lotrimin and silver alginate He has [name] breakdown in the left lateral calf. Denuded epithelium which I have removed Left buttock about the same were using Hydrofera Blue 2/15; left foot is about the same there is less surrounding erythema. Surface still has tightly adherent debris which I have debriding however not making any progress Right foot has [name] substantial wound on the medial right second toe between the first and second webspace. Still an open area on the left lateral calf distal area. Buttock wound is about the same 2/22; left foot is about the same less surrounding erythema. Surface has adherent debris. Polymen Ag Right foot area significant wound between the first and second toes. We have been using silver alginate here Left lateral leg polymen Ag at the base of his original venous insufficiency wound Left buttock some improvement here 3/1; Right foot is deteriorating in the first second toe webspace. Larger and more substantial. We have been using silver alginate. Left dorsal foot about the same markedly adherent surface debris using PolyMem  Ag Left lateral calf surface debris using PolyMem AG Left buttock is improved again using PolyMem Ag. He is completing his terbinafine. The erythema in the foot seems better. He has been on this for 2 weeks 3/8; no improvement in any wound area in fact he has [name] small open area on the dorsal midfoot which is new this week. He has not gotten his foot x-rays yet 3/15; his x-rays were both negative for osteomyelitis of both feet. No major change in any of his wounds on the extremities however his buttock wounds are better. We have been using polymen on the buttocks, left lower leg. Iodoflex on the left foot and silver alginate on the right 3/22; arrives in clinic today with the 2 major issues are the improvement in the left dorsal foot wound which  for once actually looks healthy with [name] nice healthy wound surface without debridement. Using Iodoflex here. Unfortunately on the left lateral calf which is in the distal part of his original wound he came to the clinic here for there was purulent drainage noted some increased breakdown scattered around the original area and [name] small area proximally. We we are using polymen here will change to silver alginate today. His buttock wound on the left is better and I think the area on the right first second toe webspace is also improved 3/29; left dorsal foot looks better. Using Iodoflex. Left ankle culture from deterioration last time grew E. coli, Enterobacter and Enterococcus. I will give him [name] course of cefdinir although that will not cover Enterococcus. The area on the right foot in the webspace of the first and second toe lateral first toe looks better. The area on his buttock is about healed Vascular appointment is on April 21. This is to look at his venous system vis--vis continued breakdown of the wounds on the left including the left lateral leg and left dorsal foot he. He has had previous ablations on this side 4/5; the area between the right first and second toes lateral aspect of the first toe looks better. Dorsal aspect of the left first toe on the left foot also improved. Unfortunately the left lateral lower leg is larger and there is [name] second satellite wound superiorly. The usual superficial abrasions on the left buttock overall better but certainly not closed 4/12; the area between the right first and second toes is improved. Dorsal aspect of the left foot also slightly smaller with [name] vibrant healthy looking surface. No real change in the left lateral leg and the left buttock wound is healed He has an unaffordable co-pay for Apligraf. Appointment with vein and vascular with regards to the left leg venous part of the circulation is on 4/21 4/19; we continue to see improvement in all wound  areas. Although this is minor. He has his vascular appointment on 4/21. The area on the left buttock has not reopened although right in the center of this area the skin looks somewhat threatened 4/26; the left buttock is unfortunately reopened. In general his left dorsal foot has [name] healthy surface and looks somewhat smaller although it was not measured as such. The area between his first and second toe webspace on the right as [name] small wound against the first toe. The patient saw vascular surgery. The real question I was asking was about the small saphenous vein on the left. He has previously ablated left greater saphenous vein. Nothing further was commented on on the left. Right greater saphenous vein without reflux at  the saphenofemoral junction or proximal thigh there was no indication for ablation of the right greater saphenous vein duplex was negative for DVT bilaterally. They did not think there was anything from [name] vascular surgery point of view that could be offered. They ABIs within normal limits 5/3; only small open area on the left buttock. The area on the left lateral leg which was his original venous reflux is now 2 wounds both which look clean. We are using Iodoflex on the left dorsal foot which looks healthy and smaller. He is down to [name] very tiny area between the right first and second toes, using silver alginate 5/10; all of his wounds appear better. We have much better edema control in 4 layer compression on the left. This may be the factor that is allowing the left foot and left lateral calf to heal. He has external compression garments at home 04/14/20-All of his wounds are progressing well, the left forefoot is practically closed, left ischium appears to be about the same, right toe webspace is also smaller. The left lateral leg is about the same, continue using Hydrofera Blue to this, silver alginate to the ischium, Iodoflex to the toe space on the right 6/7; most of his wounds  outside of the left buttock are doing well. The area on the left lateral calf and left dorsal foot are smaller. The area on the right foot in between the first and second toe webspace is barely visible although he still says there is some drainage here is the only reason I did not heal this out. Unfortunately the area on the left buttock almost looks like he has [name] skin tear from tape. He has open wound and then [name] large flap of skin that we are trying to get adherence over an area just next to the remaining wound 6/21; 2 week follow-up. I believe is been here for nurse visits. Miraculously the area between his first and second toes on the left dorsal foot is closed over. Still open on the right first second web space. The left lateral calf has 2 open areas. Distally this is more superficial. The proximal area had [name] little more depth and required debridement of adherent necrotic material. His buttock wound is actually larger we have been using silver alginate here 6/28; the patient's area on the left foot remains closed. Still open wet area between the first and second toes on the right and also extending into the plantar aspect. We have been using silver alginate in this location. He has 2 areas on the left lower leg part of his original long wounds which I think are better. We have been using Hydrofera Blue here. Hydrofera Blue to the left buttock which is stable 7/12; left foot remains closed. Left ankle is closed. May be [name] small area between his right first and second toes the only truly open area is on the left buttock. We have been using Hydrofera Blue here 7/19; patient arrives with marked deterioration especially in the left foot and ankle. We did not put him in [name] compression wrap on the left last week in fact he wore his juxta lite stockings on either side although he does not have an underlying stocking. He has [name] reopening on the left dorsal foot, left lateral ankle and [name] new area on the right  dorsal ankle. More worrisome is the degree of erythema on the left foot extending on the lateral foot into the lateral lower leg on the left 7/26; the  patient had erythema and drainage from the lateral left ankle last week. Culture of this grew MRSA resistant to doxycycline and clindamycin which are the 2 antibiotics we usually use with this patient who has multiple antibiotic allergies including linezolid, trimethoprim sulfamethoxazole. I had give him an empiric doxycycline and he comes in the area certainly looks somewhat better although it is blotchy in his lower leg. He has not been systemically unwell. He has had areas on the left dorsal foot which is [name] reopening, chronic wounds on the left lateral ankle. Both of these I think are secondary to chronic venous insufficiency. The area between his first and second toes is closed as far as I can tell. He had [name] new wrap injury on the right dorsal ankle last week. Finally he has an area on the left buttock. We have been using silver alginate to everything except the left buttock we are using Hydrofera Blue 06/30/20-Patient returns at 1 week, has been given [name] sample dose pack of NUZYRA which is [name] tetracycline derivative [omadacycline], patient has completed those, we have been using silver alginate to almost all the wounds except the left ischium where we are using Hydrofera Blue all of them look better 8/16; since I last saw the patient he has been doing well. The area on the left buttock, left lateral ankle and left foot are all closed today. He has completed the [place] I gave him last time and tolerated this well. He still has open areas on the right dorsal ankle and in the right first second toe area which we are using silver alginate. 8/23; we put him in his bilateral external compression stockings last week as he did not have anything open on either leg except for concerning area between the right first and second toe. He comes in today with an area on  the left dorsal foot slightly more proximal than the original wound, the left lateral foot but this is actually [name] continuation of the area he had on the left lateral ankle from last time. As well he is opened up on the left buttock again. 8/30; comes in today with things looking [name] lot better. The area on the left lower ankle has closed down as has the left foot but with eschar in both areas. The area on the dorsal right ankle is also epithelialized. Very little remaining of the left buttock wound. We have been using silver alginate on all wound areas 9/13; the area in the first second toe webspace on the right has fully epithelialized. He still has some vulnerable epithelium on the right and the ankle and the dorsal foot. He notes weeping. He is using his juxta lite stocking. On the left again the left dorsal foot is closed left lateral ankle is closed. We went to the juxta lite stocking here as well. Still vulnerable in the left buttock although only 2 small open areas remain here 9/27; 2-week follow-up. We did not look at his left leg but the patient says everything is closed. He is [name] bit disturbed by the amount of edema in his left foot he is using juxta lite stockings but asking about over the toes stockings which would be 30/40, will talk to him next time. According to him there is no open wound on either the left foot or the left ankle/calf He has an open area on the dorsal right calf which I initially point [name] wrap injury. He has superficial remaining wound on the left ischial tuberosity  been using silver alginate although he says this sticks to the wound 10/5; we gave him 2-week follow-up but he called yesterday expressing some concerns about his right foot right ankle and the left buttock. He came in early. There is still no open areas on the left leg and that still in his juxta lite stocking 10/11; he only has 1 small area on the left buttock that remains measuring millimeters 1 mm. Still has  the same irritated skin in this area. We recommended zinc oxide when this eventually closes and pressure relief is meticulously is he can do this. He still has an area on the dorsal part of his right first through third toes which is [name] bit irritated and still open and on the dorsal ankle near the crease of the ankle. We have been using silver alginate and using his own stocking. He has nothing open on the left leg or foot 10/25; 2-week follow-up. Not nearly as good on the left buttock as I was hoping. For open areas with 5 looking threatened small. He has the erythematous irritated chronic skin in this area. 1 area on the right dorsal ankle. He reports this area bleeds easily Right dorsal foot just proximal to the base of his toes We have been using silver alginate. 11/8; 2-week follow-up. Left buttock is about the same although I do not think the wounds are in the same location we have been using silver alginate. I have asked him to use zinc oxide on the skin around the wounds. He still has [name] small area on the right dorsal ankle he reports this bleeds easily Right dorsal foot just proximal to the base of the toes does not have anything open although the skin is very dry and scaly He has [name] new opening on the nailbed of the left great toe. Nothing on the left ankle 11/29; 3-week follow-up. Left buttock has 2 open areas. And washing of these wounds today started bleeding easily. Suggesting very friable tissue. We have been using silver alginate. Right dorsal ankle which I thought was initially [name] wrap injury we have been using silver alginate. Nothing open between the toes that I can see. He states the area on the left dorsal toe nailbed healed after the last visit in 2 or 3 days 12/13; 3-week follow-up. His left buttock now has 3 open areas but the original 2 areas are smaller using polymen here. Surrounding skin looks better. The right dorsal ankle is closed. He has [name] small opening on the right  dorsal foot at the level of the third toe. In general the skin looks better here. He is wearing his juxta lite stocking on the left leg says there is nothing open 11/24/2020; 3 weeks follow-up. His left buttock still has the 3 open areas. We have been using polymen but due to lack of response he changed to [facility] area. Surrounding skin is dry erythematous and irritated looking. There is no evidence of infection either bacterial or fungal however there is loss of surface epithelium He still has very dry skin in his foot causing irritation and erythema on the dorsal part of his toes. This is not responded to prolonged courses of antifungal simply looks dry and irritated 1/24; left buttock area still looks about the same he was unable to find the triad ointment that we had suggested. The area on the right lower leg just above the dorsal ankle has reopened and the areas on the right foot between the first  second and second third toes and scaling on the bottom of the foot has been about the same for quite some time now. been using silver alginate to all wound areas 2/7; left buttock wound looked quite good although not much smaller in terms of surface area surrounding skin looks better. Only [name] few dry flaking areas on the right foot in between the first and second toes the skin generally looks better here [ammonium lactate]. Finally the area on the right dorsal ankle is closed 2/21; There is no open area on the right foot even between the right first and second toe. Skin around this area dorsally and plantar aspects look better. He has [name] reopening of the area on the right ankle just above the crease of the ankle dorsally. I continue to think that this is probably friction from spasms may be even this time with his stocking under the compression stockings. Wounds on his left buttock look about the same there [name] couple of areas that have reopened. He has [name] total square area of loss of  epithelialization. This does not look like infection it looks like [name] contact dermatitis but I just cannot determine to what 3/14; there is nothing on the right foot between the first and second toes this was carefully inspected under illumination. Some chronic irritation on the dorsal part of his foot from toes 1-3 at the base. Nothing really open here substantially. Still has an area on the right foot/ankle that is actually larger and hyper granulated. His buttock area on the left is just about closed however he has chronic inflammation with loss of the surface epithelial layer 3/28; 2-week follow-up. In clinic today with [name] new wound on the left anterior mid tibia. Says this happened about 2 weeks ago. He is not really sure how wonders about the spasticity of his legs at night whether that could have caused this other than that he does not have [name] good idea. He has been using topical antibiotics and silver alginate. The area on his right dorsal ankle seems somewhat better. Finally everything on his left buttock is closed. 4/11; 2-week follow-up. All of his wounds are better except for the area over the ischium and left buttock which have opened up widely again. At least part of this is covered in necrotic fibrinous material another part had rolled nonviable skin. The area on the right ankle, left anterior mid tibia are both [name] lot better. He had no open wounds on either foot including the areas between the first and second toes 4/25; patient presents for 2-week follow-up. He states that the wounds are overall stable. He has no complaints today and states he is using Hydrofera Blue to open wounds. 5/9; have not seen this man in over [name] month. For my memory he has open areas on the left mid tibia and right ankle. T oday he has new open area on the right dorsal foot which we have not had [name] problem with recently. He has the sustained area on the left buttock He is also changed his insurance at the  beginning of the year [employer]. We will need prior authorizations for debridement 5/23; patient presents for 2-week follow-up. He has prior authorizations for debridement. He denies any issues in the past 2 weeks with his wound care. He has been using Hydrofera Blue to all the wounds. He does report [name] circular rash to the upper left leg that is new. He denies acute signs of infection. 6/6; 2-week follow-up.  The patient has open wounds on the left buttock which are worse than the last time I saw this about [name] month ago. He also has [name] new area to me on the left anterior mid tibia with some surrounding erythema. The area on the dorsal ankle on the right is closed but I think this will be [name] friction injury every time this area is exposed to either our wraps or his compression stockings caused by unrelenting spasms in this leg. 6/20; 2-week follow-up. The patient has open wounds on the left buttock which is about the same. Using [facility] here. - The left mid tibia has [name] static amount of surrounding erythema. Also [name] raised area in the center. We have been using Hydrofera Blue here. Finally he has broken down in his dorsal right foot extending between the first and second toes and going to the base of the first and second toe webspace. I have previously assumed that this was severe venous hypertension 7/5; 2-week follow-up The left buttock wound actually looks better. We are using Hydrofera Blue. He has extensive skin irritation around this area and I have not really been able to get that any better. I have tried Lotrisone i.e. antifungals and steroids. More most recently we have just been using Coloplast really looks about the same. The left mid tibia which was new last week culture to have very resistant staph aureus. Not only methicillin-resistant but doxycycline resistant. The patient has [name] plethora of antibiotic allergies including sulfa, linezolid. I used topical bacitracin on this but he has  not started this yet. In addition he has an expanding area of erythema with [name] wound on the dorsal right foot. I did [name] deep tissue culture of this area today 7/12; Left buttock area actually looks better surrounding skin also looks less irritated. Left mid tibia looks about the same. He is using bacitracin this is not worse Right dorsal foot looks about the same as well. The left first toe also looks about the same 7/19; left buttock wound continues to improve in terms of open areas Left mid tibia is still concerning amount of swelling he is using bacitracin Dorsal left first toe somewhat smaller Right dorsal foot somewhat smaller 7/25; left buttock wound actually continues to improve Left mid tibia area has less swelling. I gave him all my samples of new Nuzyra. This seems to have helped although the wound is still open it. His abrasion closed by here Left dorsal great toe really no better. Still [name] very nonviable surface Right dorsal foot perhaps some better. We have been using bacitracin and silver nitrate to the areas on his lower legs and Hydrofera Blue to the area on the buttock. 8/16 Disappointed that his left buttock wound is actually more substantial. Apparently during the last nurse visit these were both very small. He has continued irritation to [name] large area of skin on his buttock. I have never been able to totally explain this although I think it some combination of the way he sits, pressure, moisture. He is not incontinent enough to contribute to this. Left dorsal great toe still fibrinous debris on the surface that I have debrided today Large area across the dorsal right toes. The area on the left anterior mid tibia has less swelling. He completed the [place]. This does not look infected although the tissue is still fried 8/30; 2-week follow-up. Left buttock areas not improved. We used Hydrofera Blue on this. Weeping wet with the surrounding erythema that I  have not been able to  control even with Lotrisone and topical Coloplast Left dorsal great toe looks about the same More substantial area again at the base of his toes on the left which is new this week. Area across the dorsal right toes looks improved The left anterior mid tibia looks like it is trying to close 9/13; 2-week follow-up. Using silver alginate on all of his wounds. The left dorsal foot does not look any better. He has the area on the dorsal toe and also the areas at the base of all of his toes 1 through 3. On the right foot he has [name] similar pattern in [name] similar area. He has the area on his left mid tibia that looks fairly healthy. Finally the area on his left buttock looks somewhat bette 9/20; culture I did of the left foot which was [name] deep tissue culture last time showed E. coli he has erythema around this wound. Still [name] completely necrotic surface. His right dorsal foot looks about the same. He has [name] very friable surface to the left anterior mid tibia. Both buttock wounds look better. We have been using silver alginate to all wounds 10/4; he has completed the cephalexin that I gave him last time for the left foot. He is using topical gentamicin under silver alginate silver alginate being applied to all the wounds. Unfortunately all the wounds look irritated on his dorsal right foot dorsal left foot left mid tibia. I wonder if this could be [name] silver allergy. I am going to change him to [facility] on the lower extremity. The skin on the left buttock and left posterior thigh still flaking dry and irritated. This has continued no matter what I have applied topically to this. He has [name] solitary open wound which by itself does not look too bad however the entire area of surrounding skin does not change no matter what we have applied here 10/18; the area on the left dorsal foot and right dorsal foot both look better. The area on the right extends into the plantar but not between his toes. We have been using  silver alginate. He still has [name] rectangular erythematous area around the area on the left tibia. The wound itself is very small. Finally everything on his left buttock looks [name] little larger the skin is erythematous 11/15; patient comes in with the left dorsal and right dorsal foot distally looking somewhat better. Still nonviable surface on the left foot which required debridement. He still has the area on the left anterior mid tibia although this looks somewhat better. He has [name] new area on the right lateral lower leg just above the ankle. Finally his left buttock looks terrible with multiple superficial open wounds geometric square shaped area of chronic erythema which I have not been able to sort through 11/29; right dorsal foot and left dorsal foot both look somewhat better. No debridement required. He has the fragile area on the left anterior mid tibia this looks and continues to look somewhat better. Right lateral lower leg just above the ankle we identified last time also looks better. In general the area on his left buttock looks improved. We are using Hydrofera Blue to all wound areas 12/13; right dorsal foot looks better. The area on the right lateral leg is healed. Left dorsal foot has 2 open areas both of which require debridement. The fragile area on the left anterior mid tibial looks better. Smaller area on his buttocks. Were using Hydrofera Blue  1/10; patient comes in with everything looking slightly larger and/or worse. This includes his left buttock, reopening of the left mid tibia, larger areas on the left dorsal foot and what looks to be [name] cellulitis on the right dorsal and plantar foot. We have been using Hydrofera Blue on all wounds. 1/17; right dorsal foot distally looks better today. The left foot has 2 open wounds that are about the same surrounding erythema. Culture I did last week showed rare Enterococcus and [name] multidrug resistant MRSA. 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 No malignancy they did not stain for fungus 1/24; his right distal foot is not closed dry and scaly but the wound looks like it is contracting. I did not debride anything here. Problem on his left dorsal foot with expanding erythema. Apparently there were problems last week getting the Elesa Hacker however it is now available at the [facility] but [name] week later. He is using ketoconazole and Coloplast to the left buttock along with Hydrofera Blue this actually looks quite [name] bit better today. 1/31; right dorsal foot again is dry and scaly but looking to contract. He has been using [name] moisturizer on his feet at my request but he is not sure which 1. The left dorsal foot wounds look about the same there is erythema here that I marked last week however after course of Nuzyra it certainly is not any better but not any worse either. Finally on his left buttock the skin continues to look better he has the original wound but [name] new substantial area towards the gluteal cleft. Almost like [name] skin tear. I used scissors to remove skin and subcutaneous tissue here silver nitrate and direct pressure 2/7; right dorsal foot. This does not look too much different from last week. Some erythema skin dry and scaly. No debridement. Left dorsal great toe again still not much improvement. I did remove flaking dry skin and callus from around the edge. Finally on his left buttock. The skin is somewhat better in the periwound. Surface wounds are superficial somewhat better than last week. 01/26/2022: Is [name] little bit of [name] mystery as to why his wounds fail to respond to treatments and actually seem to get worse. This is my first encounter with this patient. He was previously followed by Dr. Dellia Nims. Based upon my review of the chart, it seems that there is [name] little bit of [name] mystery as to why his wounds do not respond as anticipated to the interventions applied and sometimes  even get worse. Biopsies have been performed and he was seen by dermatology in [place], but that did not shed any light on the matter. T oday, his gluteal wound is larger, with substantial drainage, rather malodorous. The food wounds are not terrible, but he has [name] lot of callus and scaly skin around these. He is currently getting silver alginate on the gluteal wound, with idodoflex to the feet. He is using lotrisone on his legs for the dry, scaly skin. 02/09/2022: There has really been no change to any of his wounds. The gluteal wound less drainage and odor, but remains about the same size, the periwound skin remains oddly scaly. His lower extremity wounds also appear roughly the same size. They continue to accumulate [name] small amount of slough. The periwound on his feet and ankle wounds has dry eschar and loose dead skin. We have been using silver alginate on the gluteal wound and Iodoflex on his feet and  ankle wounds. T the periwound around his gluteal lesion and Lotrisone on his feet and legs. o 02/23/2022: The right plantar foot wound is closed. The gluteal site looks small but has continued to produce hypertrophic granulation tissue. The foot wounds all look about the same on the dorsal surface of the right foot; on the left, there is only [name] small open area at the site of where his left great toenail would have been. 03/16/2022: The right ankle wound is healed. The right dorsal foot wound is about the same. The left dorsal foot wound is quite [name] bit smaller and the ischial wound is nearly closed. 03/30/2022: The right ankle wound reopened. Both dorsal foot wounds are quite [name] bit smaller. Unfortunately, he appears to have sheared part of his ischial wound open further, perhaps during [name] transfer. 04/13/2022: The right ankle wound has hypertrophic granulation tissue present. The dorsal foot wounds continue to decrease in size. The ischial wound looks about the same today, no better, no worse. 04/27/2022:  The right ankle wound has closed. Unfortunately, it looks like some moisture got underneath the dry skin on both of his dorsal feet and these wounds have expanded in size. The ischial wound remains the same with perhaps [name] little bit more slough accumulation than at our previous visit. 05/11/2022: The right ankle wound remains closed. There is [name] left anterior tibial wound that is small has patchy openings with accumulated slough. The dorsum of his right foot appears to be nearly healed with just [name] small punctate opening. The plantar surface of his right foot has [name] new opening that looks like he may have picked some skin there. His sacral ulcer has hypertrophic granulation tissue but has some slough accumulation. The dorsum of his left foot has multiple open areas in [name] fairly ragged distribution. All of these have slough accumulated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 05/03/16 we did not remove the dressing from the superior wound as this is where I put all of his last Apligraf. Surface debridement done with [name] curette of the lower wound which looks very healthy. The area on the left foot also looks quite satisfactory at the dorsal artery at the first and second toes 05/10/16; continue Apligraf to this. Her wound, Hydrafera to the lower wound. He has [name] new area on  the right second toe. Left dorsal foot firstoosecond toe also looks improved 05/24/16; wound dimensions must be smaller I was able to use Apligraf to all 3 remaining wound areas. 06/07/16 patient's last Apligraf was 2 weeks ago. He arrives today with the 2 wounds on his lateral left leg joined together. This would have to be seen as [name] negative. He also has [name] small wound in his first and second toe on the left dorsally with quite [name] bit of surrounding erythema in the first second and third toes. This looks to be infected or inflamed, very difficult clinical call. 06/21/16: lateral left leg combined wounds. Adherent surface slough area on the left dorsal foot at roughly the fourth toe looks improved 07/12/16; he now has [name] single linear wound on the lateral left leg. This does not look to be [name] lot changed from when I lost saw this. The area on his dorsal left foot  looks considerably better however. 08/02/16; no major change in the substantial area on his left lateral leg since last time. We have been using Hydrofera Blue for [name] prolonged period of time now. The area on his left foot is also unchanged from last review 07/19/16; the area on his dorsal foot on the left looks considerably smaller. He is beginning to have significant rims of epithelialization on the lateral left leg wound. This also looks better. 08/05/16; the patient came in for [name] nurse visit today. Apparently the area on his left lateral leg looks better and it was wrapped. However in general discussion the patient noted [name] new area on the dorsal aspect of his right second toe. The exact etiology of this is unclear but likely relates to pressure. 08/09/16 really the area on the left lateral leg did not really look that healthy today perhaps slightly larger and measurements. The area on his dorsal right second toe is improved also the left foot wound looks stable to improved 08/16/16; the area on the last lateral leg did not change any of dimensions.  Post debridement with [name] curet the area looked better. Left foot wound improved and the area on the dorsal right second toe is improved 08/23/16; the area on the left lateral leg may be slightly smaller both in terms of length and width. Aggressive debridement with [name] curette afterwards the tissue appears healthier. Left foot wound appears improved in the area on the dorsal right second toe is improved 08/30/16 patient developed [name] fever over the weekend and was seen in an urgent care. Felt to have [name] UTI and put on doxycycline. He has been since changed over the phone to [facility]. After we took off the wrap on his right leg today the leg is swollen warm and erythematous, probably more likely the source of the fever 09/06/16; have been using collagen to the major left leg wound, silver alginate to the area on his anterior foot/toes 09/13/16; the areas on his anterior foot/toes on both sides appear to be virtually closed. Extensive wound on the left lateral leg perhaps slightly narrower but each visit still covered an adherent surface slough 09/16/16 patient was in for his usual Thursday nurse visit however the intake nurse noted significant erythema of his dorsal right foot. He is also running [name] low- grade fever and having increasing spasms in the right leg 09/20/16 here for cellulitis involving his right great toes and forefoot. This is [name] lot better. Still requiring debridement on his left lateral leg. Santyl direct says he needs prior authorization. Therefore his wife cannot change this at home 09/30/16; the patient's extensive area on the left lateral calf and ankle perhaps somewhat better. Using Santyl. The area on the left toes is healed and I think the area on his right dorsal foot is healed as well. There is no cellulitis or venous inflammation involving the right leg. He is going to need compression stockings here. 10/07/16; the patient's extensive wound on the left lateral calf and ankle does not  measure any differently however there appears to be less adherent surface slough using Santyl and aggressive weekly debridements 10/21/16; no major change in the area on the left lateral calf. Still the same measurement still very difficult to debridement adherent slough and nonviable subcutaneous tissue. This is not really been helped by several weeks of Santyl. Previously for 2 weeks I used Iodoflex for [name] short period. [name] prolonged course of Hydrofera Blue didn't really help. I'm not sure why  I only used 2 weeks of Iodoflex on this there is no evidence of surrounding infection. He has [name] small area on the right second toe which looks as though it's progressing towards closure 10/28/16; the wounds on his toes appear to be closed. No major change in the left lateral leg wound although the surface looks somewhat better using Iodoflex. He has had previous arterial studies that were normal. He has had reflux studies and is status post ablation although I don't have any exact notes on which vein was ablated. I'll need to check the surgical record 11/04/16; he's had [name] reopening between the first and second toe on the left and right. No major change in the left lateral leg wound. There is what appears to be cellulitis of the left dorsal foot 11/18/16 the patient was hospitalized initially in [place] and then subsequently transferred to [facility] and was admitted there from 11/09/16 through 11/12/16. He had developed progressive cellulitis on the right leg in spite of the doxycycline I gave him. I'd spoken to the hospitalist in [place] who was concerned about continuing leukocytosis. CT scan is what I suggested this was done which showed soft tissue swelling without evidence of osteomyelitis or an underlying abscess blood cultures were negative. At [facility] he was treated with vancomycin and Primaxin and then add an infectious disease consult. He was transitioned to Ceftaroline. He has been making  progressive improvement. Overall [name] severe cellulitis of the right leg. He is been using silver alginate to her original wound on the left leg. The wounds in his toes on the right are closed there is [name] small open area on the base of the left second toe 11/26/15; the patient's right leg is much better although there is still some edema here this could be reminiscent from his severe cellulitis likely on top of some degree of lymphedema. His left anterior leg wound has less surface slough as reported by her intake nurse. Small wound at the base of the left second toe 12/02/16; patient's right leg is better and there is no open wound here. His left anterior lateral leg wound continues to have [name] healthy-looking surface. Small wound at the base of the left second toe however there is erythema in the left forefoot which is worrisome 12/16/16; is no open wounds on his right leg. We took measurements for stockings. His left anterior lateral leg wound continues to have [name] healthy-looking surface. I'm not sure where we were with the Apligraf run through his insurance. We have been using Iodoflex. He has [name] thick eschar on the left first second toe interface, I suspect this may be fungal however there is no visible open 12/23/16; no open wound on his right leg. He has 2 small areas left of the linear wound that was remaining last week. We have been using Prisma, I thought I have disclosed this week, we can only look forward to next week 01/03/17; the patient had concerning areas of erythema last week, already on doxycycline for UTI through his primary doctor. The erythema is absolutely no better there is warmth and swelling both medially from the left lateral leg wound and also the dorsal left foot. 01/06/17- Patient is here for follow-up evaluation of his left lateral leg ulcer and bilateral feet ulcers. He is on oral antibiotic therapy, tolerating that. Nursing staff and the patient states that the erythema is improved  from Monday. 01/13/17; the predominant left lateral leg wound continues to be problematic. I had put  Apligraf on him earlier this month once. However he subsequently developed what appeared to be an intense cellulitis around the left lateral leg wound. I gave him Dalvance I think on 2/12 perhaps 2/13 he continues on cefdinir. The erythema is still present but the warmth and swelling is improved. I am hopeful that the cellulitis part of this control. I wouldn't be surprised if there is an element of venous inflammation as well. 01/17/17. The erythema is present but better in the left leg. His left lateral leg wound still does not have [name] viable surface buttons certain parts of this long thin wound it appears like there has been improvement in dimensions. 01/20/17; the erythema still present but much better in the left leg. I'm thinking this is his usual degree of chronic venous inflammation. The wound on the left leg looks somewhat better. Is less surface slough 01/27/17; erythema is back to the chronic venous inflammation. The wound on the left leg is somewhat better. I am back to the point where I like to try an Apligraf once again 02/10/17; slight improvement in wound dimensions. Apligraf #2. He is completing his doxycycline 02/14/17; patient arrives today having completed doxycycline last Thursday. This was supposed to be [name] nurse visit however once again he hasn't tense erythema from the medial part of his wound extending over the lower leg. Also erythema in his foot this is roughly in the same distribution as last time. He has baseline chronic venous inflammation however this is [name] lot worse than the baseline I have learned to accept the on him is baseline inflammation 02/24/17- patient is here for follow-up evaluation. He is tolerating compression therapy. His voicing no complaints or concerns he is here anticipating an Apligraf 03/03/17; he arrives today with an adherent necrotic surface. I don't think this  is surface is going to be amenable for Apligraf's. The erythema around his wound and on the left dorsal foot has resolved he is off antibiotics 03/10/17; better-looking surface today. I don't think he can tolerate Apligraf's. He tells me he had [name] wound VAC after [name] skin graft years ago to this area and they had difficulty with [name] seal. The erythema continues to be stable around this some degree of chronic venous inflammation but he also has recurrent cellulitis. We have been using Iodoflex 03/17/17; continued improvement in the surface and may be small changes in dimensions. Using Iodoflex which seems the only thing that will control his surface 03/24/17- He is here for follow up evaluation of his LLE lateral ulceration and ulcer to right dorsal foot/toe space. He is voicing no complaints or concerns, He is tolerating compression wrap. 03/31/17 arrives today with [name] much healthier looking wound on the left lower extremity. We have been using Iodoflex for [name] prolonged period of time which has for the first time prepared and adequate looking wound bed although we have not had much in the way of wound dimension improvement. He also has [name] small wound between the first and second toe on the right 04/07/17; arrives today with [name] healthy-looking wound bed and at least the top 50% of this wound appears to be now her. No debridement was required I have changed him to [facility] last week after prolonged Iodoflex. He did not do well with Apligraf's. We've had [name] re-opening between the first and second toe on the right 04/14/17; arrives today with [name] healthier looking wound bed contractions and the top 50% of this wound and some on the lesser 50%.  Wound bed appears healthy. The area between the first and second toe on the right still remains problematic 04/21/17; continued very gradual improvement. Using [facility] 04/28/17; continued very gradual improvement in the left lateral leg venous insufficiency wound. His  periwound erythema is very mild. We have been using Hydrofera Blue. Wound is making progress especially in the superior 50% 05/05/17; he continues to have very gradual improvement in the left lateral venous insufficiency wound. Both in terms with an length rings are improving. I debrided this every 2 weeks with #5 curet and we have been using Hydrofera Blue and again making good progress With regards to the wounds between his right first and second toe which I thought might of been tinea pedis he is not making as much progress very dry scaly skin over the area. Also the area at the base of the left first and second toe in [name] similar condition 05/12/17; continued gradual improvement in the refractory left lateral venous insufficiency wound on the left. Dimension smaller. Surface still requiring debridement using Hydrofera Blue 05/19/17; continued gradual improvement in the refractory left lateral venous ulceration. Careful inspection of the wound bed underlying rumination suggested some degree of epithelialization over the surface no debridement indicated. Continue Hydrofera Blue difficult areas between his toes first and third on the left than first and second on the right. I'm going to change to silver alginate from silver collagen. Continue ketoconazole as I suspect underlying tinea pedis 05/26/17; left lateral leg venous insufficiency wound. We've been using Hydrofera Blue. I believe that there is expanding epithelialization over the surface of the wound albeit not coming from the wound circumference. This is [name] bit of an odd situation in which the epithelialization seems to be coming from the surface of the wound rather than in the exact circumference. There is still small open areas mostly along the lateral margin of the wound. ooHe has unchanged areas between the left first and second and the right first second toes which I been treating for tenia pedis 06/02/17; left lateral leg venous insufficiency  wound. We have been using Hydrofera Blue. Somewhat smaller from the wound circumference. The surface of the wound remains [name] bit on it almost epithelialized sedation in appearance. I use an open curette today debridement in the surface of all of this especially the edges ooSmall open wounds remaining on the dorsal right first and second toe interspace and the plantar left first second toe and her face on the left 06/09/17; wound on the left lateral leg continues to be smaller but very gradual and very dry surface using Hydrofera Blue 06/16/17 requires weekly debridements now on the left lateral leg although this continues to contract. I changed to silver collagen last week because of dryness of the wound bed. Using Iodoflex to the areas on his first and second toes/web space bilaterally 06/24/17; patient with history of paraplegia also chronic venous insufficiency with lymphedema. Has [name] very difficult wound on the left lateral leg. This has been gradually reducing in terms of with but comes in with [name] very dry adherent surface. High switch to silver collagen [name] week or so ago with hydrogel to keep the area moist. This is been refractory to multiple dressing attempts. He also has areas in his first and second toes bilaterally in the anterior and posterior web space. I had been using Iodoflex here after [name] prolonged course of silver alginate with ketoconazole was ineffective [question tinea pedis] 07/14/17; patient arrives today with [name] very difficult adherent  material over his left lateral lower leg wound. He also has surrounding erythema and poorly controlled edema. He was switched his Santyl last visit which the nurses are applying once during his doctor visit and once on [name] nurse visit. He was also reduced to 2 layer compression I'm not exactly sure of the issue here. 07/21/17; better surface today after 1 week of Iodoflex. Significant cellulitis that we treated last week also better. [Doxycycline] 07/28/17  better surface today with now 2 weeks of Iodoflex. Significant cellulitis treated with doxycycline. He has now completed the doxycycline and he is back to his usual degree of chronic venous inflammation/stasis dermatitis. He reminds me he has had ablations surgery here 08/04/17; continued improvement with Iodoflex to the left lateral leg wound in terms of the surface of the wound although the dimensions are better. 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 Problematic areas on the plantar aspect of the first second toe web space on the left and the dorsal aspect of the first second toe web space on the right. At one point I felt these were probably related to chronic fungal infections in treated him aggressively for this although we have not made any improvement here. 08/11/17; left lateral leg. Surface continues to improve with the Iodoflex although we are not seeing much improvement in overall wound dimensions. Areas on his plantar left foot and right foot show no improvement. In fact the right foot looks somewhat worse 08/18/17; left lateral leg. We changed to [facility] Blue last week after [name] prolonged course of Iodoflex which helps get the surface better. It appears that the wound with is improved. Continue with difficult areas on the left dorsal first second and plantar first second on the right 09/01/17; patient arrives in clinic today having had [name] temperature of 103 yesterday. He was seen in the ER and [facility]. The patient was concerned he could have cellulitis again in the right leg however they diagnosed him with [name] UTI and he is now on Keflex. He has [name] history of cellulitis which is been recurrent and difficult but this is been in the left leg, in the past 5 use doxycycline. He does in and out catheterizations at home which are risk factors for UTI 09/08/17; patient will be completing his Keflex this weekend. The erythema on  the left leg is considerably better. He has [name] new wound today on the medial part of the right leg small superficial almost looks like [name] skin tear. He has worsening of the area on the right dorsal first and second toe. His major area on the left lateral leg is better. Using Hydrofera Blue on all areas 09/15/17; gradual reduction in width on the long wound in the left lateral leg. No debridement required. He also has wounds on the plantar aspect of his left first second toe web space and on the dorsal aspect of the right first second toe web space. 09/22/17; there continues to be very gradual improvements in the dimensions of the left lateral leg wound. He hasn't round erythematous spot with might be pressure on his wheelchair. There is no evidence obviously of infection no purulence no warmth ooHe has [name] dry scaled area on the plantar aspect of the left first second toe ooImproved area on the dorsal right first second toe. 09/29/17; left lateral leg wound continues to improve in dimensions mostly with an is still [name] fairly long but increasingly narrow wound. ooHe has  [name] dry scaled area on the plantar aspect of his left first second toe web space ooIncreasingly concerning area on the dorsal right first second toe. In fact I am concerned today about possible cellulitis around this wound. The areas extending up his second toe and although there is deformities here almost appears to abut on the nailbed. 10/06/17; left lateral leg wound continues to make very gradual progress. Tissue culture I did from the right first second toe dorsal foot last time grew MRSA and enterococcus which was vancomycin sensitive. This was not sensitive to clindamycin or doxycycline. He is allergic to Zyvox and sulfa we have therefore arrange for him to have dalvance infusion tomorrow. He is had this in the past and tolerated it well 10/20/17; left lateral leg wound continues to make decent progress. This is certainly reduced in  terms of with there is advancing epithelialization.ooThe cellulitis in the right foot looks better although he still has [name] deep wound in the dorsal aspect of the first second toe web space. Plantar left first toe web space on the left I think is making some progress 10/27/17; left lateral leg wound continues to make decent progress. Advancing epithelialization.using Hydrofera Blue ooThe right first second toe web space wound is better-looking using silver alginate ooImprovement in the left plantar first second toe web space. Again using silver alginate 11/03/17 left lateral leg wound continues to make decent progress albeit slowly. Using Hydrofera Blue ooThe right per second toe web space continues to be [name] very problematic looking punched out wound. I obtained [name] piece of tissue for deep culture I did extensively treated this for fungus. It is difficult to imagine that this is [name] pressure area as the patient states other than going outside he doesn't really wear shoes at home ooThe left plantar first second toe web space looked fairly senescent. Necrotic edges. This required debridement oochange to Hydrofera Blue to all wound areas 11/10/17; left lateral leg wound continues to contract. Using Hydrofera Blue ooOn the right dorsal first second toe web space dorsally. Culture I did of this area last week grew MRSA there is not an easy oral option in this patient was multiple antibiotic allergies or intolerances. This was only [name] rare culture isolate I'm therefore going to use Bactroban under silver alginate ooOn the left plantar first second toe web space. Debridement is required here. This is also unchanged 11/17/17; left lateral leg wound continues to contract using Hydrofera Blue this is no longer the major issue. ooThe major concern here is the right first second toe web space. He now has an open area going from dorsally to the plantar aspect. There is now wound on the inner lateral part of  the first toe. Not [name] very viable surface on this. There is erythema spreading medially into the forefoot. ooNo major change in the left first second toe plantar wound 11/24/17; left lateral leg wound continues to contract using Hydrofera Blue. Nice improvement today ooThe right first second toe web space all of this looks [name] lot less angry than last week. I have given him clindamycin and topical Bactroban for MRSA and terbinafine for the possibility of underlining tinea pedis that I could not control with ketoconazole. Looks somewhat better ooThe area on the plantar left first second toe web space is weeping with dried debris around the wound 12/01/17; left lateral leg wound continues to contract he Hydrofera Blue. It is becoming thinner in terms of with nevertheless it is making good improvement. ooThe right  first second toe web space looks less angry but still [name] large necrotic-looking wounds starting on the plantar aspect of the right foot extending between the toes and now extensively on the base of the right second toe. I gave him clindamycin and topical Bactroban for MRSA anterior benefiting for the possibility of underlying tinea pedis. Not looking better today ooThe area on the left first/second toe looks better. Debrided of necrotic debris 12/05/17* the patient was worked in urgently today because over the weekend he found blood on his incontinence bad when he woke up. He was found to have an ulcer by his wife who does most of his wound care. He came in today for [place] to look at this. He has not had [name] history of wounds in his buttocks in spite of his paraplegia. 12/08/17; seen in follow-up today at his usual appointment. He was seen earlier this week and found to have [name] new wound on his buttock. We also follow him for wounds on the left lateral leg, left first second toe web space and right first second toe web space 12/15/17; we have been using Hydrofera Blue to the left lateral leg which has  improved. The right first second toe web space has also improved. Left first second toe web space plantar aspect looks stable. The left buttock has worsened using Santyl. Apparently the buttock has drainage 12/22/17; we have been using Hydrofera Blue to the left lateral leg which continues to improve now 2 small wounds separated by normal skin. He tells [place] he had [name] fever up to 100 yesterday he is prone to UTIs but has not noted anything different. He does in and out catheterizations. The area between the first and second toes today does not look good necrotic surface covered with what looks to be purulent drainage and erythema extending into the third toe. I had gotten this to something that I thought look better last time however it is not look good today. He also has [name] necrotic surface over the buttock wound which is expanded. I thought there might be infection under here so I removed [name] lot of the surface with [name] #5 curet though nothing look like it really needed culturing. He is been using Santyl to this area 12/27/17; his original wound on the left lateral leg continues to improve using Hydrofera Blue. I gave him samples of Baxdella although he was unable to take them out of fear for an allergic reaction ["lump in his throat"].the culture I did of the purulent drainage from his second toe last week showed both enterococcus and [name] set Enterobacter I was also concerned about the erythema on the bottom of his foot although paradoxically although this looks somewhat better today. 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 We've been using silver alginate here. They came up today that he sleeps in [name] recliner, I'm not sure why but I asked him to stop this 01/03/18; his original wound we've been using Hydrofera Blue is now separated into 2 areas. ooUlcer on his left buttock is better he is off the recliner and  sleeping in bed ooFinally both wound areas between his first and second toes also looks some better 01/10/18; his original wound on the left lateral leg is now separated into 2 wounds we've been using Hydrofera Blue ooUlcer on his left buttock has some drainage. There is [name] small probing site going into muscle layer  superiorly.using silver alginate -He arrives today with [name] deep tissue injury on the left heel ooThe wound on the dorsal aspect of his first second toe on the left looks [name] lot betterusing silver alginate ketoconazole ooThe area on the first second toe web space on the right also looks [name] lot bette 01/17/18; his original wound on the left lateral leg continues to progress using Hydrofera Blue ooUlcer on his left buttock also is smaller surface healthier except for [name] small probing site going into the muscle layer superiorly. 2.4 cm of tunneling in this area ooDTI on his left heel we have only been offloading. Looks better than last week no threatened open no evidence of infection oothe wound on the dorsal aspect of the first second toe on the left continues to look like it's regressing we have only been using silver alginate and terbinafine orally ooThe area in the first second toe web space on the right also looks to be [name] lot better using silver alginate and terbinafine I think this was prompted by tinea pedis 01/31/18; the patient was hospitalized in [place] last week apparently for [name] complicated UTI. He was discharged on cefepime he does in and out catheterizations. In the hospital he was discovered M I don't mild elevation of AST and ALT and the terbinafine was stopped.predictably the pressure ulcer on s his buttock looks betterusing silver alginate. The area on the left lateral leg also is better using Hydrofera Blue. The area between the first and second toes on the left better. First and second toes on the right still substantial but better. Finally the DTI on the left heel has  held together and looks like it's resolving 02/07/18-he is here in follow-up evaluation for multiple ulcerations. He has new injury to the lateral aspect of the last issue [name] pressure ulcer, he states this is from adhesive removal trauma. He states he has tried multiple adhesive products with no success. All other ulcers appear stable. The left heel DTI is resolving. We will continue with same treatment plan and follow-up next week. 02/14/18; follow-up for multiple areas. ooHe has [name] new area last week on the lateral aspect of his pressure ulcer more over the posterior trochanter. The original pressure ulcer looks quite stable has healthy granulation. 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 Almost fully epithelialized on the original superior area using Hydrofera Blue ooDTI on the left heel has peeled off this week to reveal [name] small superficial wound under denuded skin and subcutaneous tissue ooBoth areas between the first and second toes look better including nothing open on the left 02/21/18; ooThe patient's wounds on his left ischial tuberosity and posterior left greater trochanter actually looked better. He has [name] large area of irritation around the area which I think is contact dermatitis. I am doubtful that this is fungal ooHis original wound on the left lateral calf continues to improve we have been using Hydrofera Blue ooThere is no open area in the left first second toe web space although there is [name] lot of thick callus ooThe DTI on the left heel required debridement today of necrotic surface eschar and subcutaneous tissue using silver alginate ooFinally the area on the right first second toe webspace continues to contract using silver alginate and ketoconazole 02/28/18 ooLeft ischial tuberosity wounds look better using silver alginate. ooOriginal wound on the left calf only has  one small open area left  using Hydrofera Blue ooDTI on the left heel required debridement mostly removing skin from around this wound surface. Using silver alginate ooThe areas on the right first/second toe web space using silver alginate and ketoconazole 03/08/18 on evaluation today patient appears to be doing decently well as best I can tell in regard to his wounds. This is the first time that I have seen him as he generally is followed by Dr. Dellia Nims. With that being said none of his wounds appear to be infected he does have an area where there is some skin covering what appears to be [name] new wound on the left dorsal surface of his great toe. This is right at the nail bed. With that being said I do believe that debrided away some of the excess skin can be of benefit in this regard. Otherwise he has been tolerating the dressing changes without complication. 03/14/18; patient arrives today with the multiplicity of wounds that we are following. He has not been systemically unwell ooOriginal wound on the left lateral calf now only has 2 small open areas we've been using Hydrofera Blue which should continue ooThe deep tissue injury on the left heel requires debridement today. We've been using silver alginate ooThe left first second toe and the right first second toe are both are reminiscence what I think was tinea pedis. Apparently some of the callus Surface between the toes was removed last week when it started draining. ooPurulent drainage coming from the wound on the ischial tuberosity on the left. 03/21/18-He is here in follow-up evaluation for multiple wounds. 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 He tolerated debridement. The only change to last week's recommendations is to discontinue antifungal cream between toes. He will follow-up next week 03/28/18; following up for multiple wounds;Concern this week is streaking  redness and swelling in the right foot. He is going to need antibiotics for this. 03/31/18; follow-up for right foot cellulitis. Streaking redness and swelling in the right foot on 03/28/18. 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 I put him on clindamycin 300 mg every 6 and brought him in for [name] quick check. He has an open wound between his first and second toes on the right foot as [name] potential source. 04/04/18; ooRight foot cellulitis is resolving he is completing clindamycin. This is truly good news ooLeft lateral calf wound which is initial wound only has one small open area inferiorly this is close to healing out. He has compression stockings. We will use Hydrofera Blue right down to the epithelialization of this ooNonviable surface on the left heel which was initially pressure with [name] DTI. We've been using Hydrofera Blue. I'm going to switch this back to silver alginate ooLeft first second toe/tinea pedis this looks better using silver alginate ooRight first second toe tinea pedis using silver alginate ooLarge pressure ulcers on theLeft ischial tuberosity. Small wound here Looks better. I am uncertain about the surface over the large wound. Using silver alginate 04/11/18; ooCellulitis in the right foot is resolved ooLeft lateral calf wound which was his original wounds still has 2 tiny open areas remaining this is just about closed ooNonviable surface on the left heel is better but still requires debridement ooLeft first second toe/tinea pedis still open using silver alginate ooRight first second toe wound tinea pedis I asked him to go back to using ketoconazole and silver alginate ooLarge pressure ulcers on the left ischial tuberosity this shear injury  here is resolved. Wound is smaller. No evidence of infection using silver alginate 04/18/18; ooPatient arrives with an intense area of cellulitis in the right mid lower calf extending into the right heel area.  Bright red and warm. Smaller area on the left anterior leg. He has [name] significant history of MRSA. He will definitely need antibioticsoodoxycycline ooHe now has 2 open areas on the left ischial tuberosity the original large wound and now [name] satellite area which I think was above his initial satellite areas. Not [name] wonderful surface on this satellite area surrounding erythema which looks like pressure related. ooHis left lateral calf wound again his original wound is just about closed ooLeft heel pressure injury still requiring debridement ooLeft first second toe looks [name] lot better using silver alginate ooRight first second toe also using silver alginate and ketoconazole cream also looks better 04/20/18; the patient was worked in early today out of concerns with his cellulitis on the right leg. I had started him on doxycycline. This was 2 days ago. His wife was concerned about the swelling in the area. Also concerned about the left buttock. He has not been systemically unwell no fever chills. No nausea vomiting or diarrhea 04/25/18; the patient's left buttock wound is continued to deteriorate he is using Hydrofera Blue. He is still completing clindamycin for the cellulitis on the right leg although all of this looks better. 05/02/18 ooLeft buttock wound still with [name] lot of drainage and [name] very tightly adherent fibrinous necrotic surface. He has [name] deeper area superiorly ooThe left lateral calf wound is still closed ooDTI wound on the left heel necrotic surface especially the circumference using Iodoflex ooAreas between his left first second toe and right first second toe both look better. Dorsally and the right first second toe he had [name] necrotic surface although at smaller. In using silver alginate and ketoconazole. I did [name] culture last week which was [name] deep tissue culture of the reminiscence of the open wound on the right first second toe dorsally. This grew [name] few Acinetobacter and [name] few  methicillin-resistant staph aureus. Nevertheless the area actually this week looked better. I didn't feel the need to specifically address this at least in terms of systemic antibiotics. 05/09/18; wounds are measuring larger more drainage per our intake. We are using Santyl covered with alginate on the large superficial buttock wounds, Iodosorb on the left heel, ketoconazole and silver alginate to the dorsal first and second toes bilaterally. 05/16/18; ooThe area on his left buttock better in some aspects although the area superiorly over the ischial tuberosity required an extensive debridement.using Santyl ooLeft heel appears stable. Using Iodoflex ooThe areas between his first and second toes are not bad however there is spreading erythema up the dorsal aspect of his left foot this looks like cellulitis again. He is insensate the erythema is really very brilliant.o Erysipelas He went to see an allergist days ago because he was itching part of this he had lab work done. This showed [name] white count of 15.1 with 70% neutrophils. Hemoglobin of 11.4 and [name] platelet count of 659,000. Last white count we had in Epic was [name] 2-1/2 years ago which was 25.9 but he was ill at the time. He was able to show me some lab work that was done by his primary physician the pattern is about the same. 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 But prompted me to go ahead and do x-rays of  both feet and the pelvis rule out osteomyelitis. He also had [name] comprehensive metabolic panel this was reasonably normal his albumin was 3.7 liver function tests BUN/creatinine all normal 05/23/18; x-rays of both his feet from last week were negative for underlying pulmonary abnormality. The x-ray of his pelvis however showed mild irregularity in the left ischial which may represent some early osteomyelitis. The wound in the left ischial continues to get deeper clearly now exposed muscle. Each  week necrotic surface material over this area. Whereas the rest of the wounds do not look so bad. ooThe left ischial wound we have been using Santyl and calcium alginate ooT the left heel surface necrotic debris using Iodoflex o ooThe left lateral leg is still healed ooAreas on the left dorsal foot and the right dorsal foot are about the same. There is some inflammation on the left which might represent contact dermatitis, fungal dermatitis I am doubtful cellulitis although this looks better than last week 05/30/18; CT scan done at Hospital did not show any osteomyelitis or abscess. Suggested the possibility of underlying cellulitis although I don't see [name] lot of evidence of this at the bedside ooThe wound itself on the left buttock/upper thigh actually looks somewhat better. No debridement ooLeft heel also looks better no debridement continue Iodoflex ooBoth dorsal first second toe spaces appear better using Lotrisone. Left still required debridement 06/06/18; ooIntake reported some purulent looking drainage from the left gluteal wound. Using Santyl and calcium alginate ooLeft heel looks better although still [name] nonviable surface requiring debridement ooThe left dorsal foot first/second webspace actually expanding and somewhat deeper. I may consider doing [name] shave biopsy of this area ooRight dorsal foot first/second webspace appears stable to improved. Using Lotrisone and silver alginate to both these areas 06/13/18 ooLeft gluteal surface looks better. Now separated in the 2 wounds. No debridement required. Still drainage. We'll continue silver alginate ooLeft heel continues to look better with Iodoflex continue this for at least another week ooOf his dorsal foot wounds the area on the left still has some depth although it looks better than last week. We've been using Lotrisone and silver alginate 06/20/18 ooLeft gluteal continues to look better healthy tissue ooLeft heel continues  to look better healthy granulation wound is smaller. He is using Iodoflex and his long as this continues continue the Iodoflex ooDorsal right foot looks better unfortunately dorsal left foot does not. There is swelling and erythema of his forefoot. He had minor trauma to this several days ago but doesn't think this was enough to have caused any tissue injury. Foot looks like cellulitis, we have had this problem before 06/27/18 on evaluation today patient appears to be doing [name] little worse in regard to his foot ulcer. 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 Both of which would really be his only options for treating this infection. In the past he has been given and effusion of Orbactiv. This is done very well for him in the past again it's one time dosing IV antibiotic therapy. Subsequently I do believe this is something we're gonna need to see about doing at this point in time. Currently his other wounds seem to be doing somewhat better in my pinion I'm pretty happy in that regard. 07/03/18 on evaluation today patient's wounds actually appear to be doing fairly well. He has been tolerating the dressing changes without  complication. All in all he seems to be showing signs of improvement. In regard to the antibiotics he has been dealing with infectious disease since I saw him last week as far as getting this scheduled. In the end [facility]'s [facility] this coming Friday. In the meantime he has been continuing to perform the dressing changes in such as previous. There does not appear to be any evidence of infection worsengin at this time. 07/10/18; ooSince I last saw this man 2 weeks ago things have actually improved. IV antibiotics of resulted in less forefoot erythema although there is still some present. He is  not systemically unwell ooLeft buttock wounds o2 now have no depth there is increased epithelialization Using silver alginate ooLeft heel still requires debridement using Iodoflex ooLeft dorsal foot still with [name] sizable wound about the size of [name] border but healthy granulation ooRight dorsal foot still with [name] slitlike area using silver alginate 07/18/18; the patient's cellulitis in the left foot is improved in fact I think it is on its way to resolving. ooLeft buttock wounds o2 both look better although the larger one has hypertension granulation we've been using silver alginate ooLeft heel has some thick circumferential redundant skin over the wound edge which will need to be removed today we've been using Iodoflex ooLeft dorsal foot is still [name] sizable wound required debridement using silver alginate ooThe right dorsal foot is just about closed only [name] small open area remains here 07/25/18; left foot cellulitis is resolved ooLeft buttock wounds o2 both look better. Hyper-granulation on the major area ooLeft heel as some debris over the surface but otherwise looks [name] healthier wound. Using silver collagen ooRight dorsal foot is just about closed 07/31/18; arrives with our intake nurse worried about purulent drainage from the buttock. We had hyper-granulation here last week ooHis buttock wounds o2 continue to look better ooLeft heel some debris over the surface but measuring smaller. ooRight dorsal foot unfortunately has openings between the toes ooLeft foot superficial wound looks less aggravated. 08/07/18 ooButtock wounds continue to look better although some of her granulation and the larger medial wound. silver alginate ooLeft heel continues to look [name] lot better.silver collagen ooLeft foot superficial wound looks less stable. Requires debridement. He has [name] new wound superficial area on the foot on the lateral dorsal foot. ooRight foot looks better using silver alginate without  Lotrisone 08/14/2018; patient was in the ER last week diagnosed with [name] UTI. He is now on Cefpodoxime and Macrodantin. ooButtock wounds continued to be smaller. Using silver alginate ooLeft heel continues to look better using silver collagen ooLeft foot superficial wound looks as though it is improving ooRight dorsal foot area is just about healed. 08/21/2018; patient is completed his antibiotics for his UTI. ooHe has 2 open areas on the buttocks. There is still not closed although the surface looks satisfactory. Using silver alginate ooLeft heel continues to improve using silver collagen ooThe bilateral dorsal foot areas which are at the base of his first and second toes/possible tinea pedis are actually stable on the left but worse on the right. The area on the left required debridement of necrotic surface. After debridement I obtained [name] specimen for PCR culture. ooThe right dorsal foot which is been just about healed last week is now reopened 08/28/2018; culture done on the left dorsal foot showed coag negative staph both staph epidermidis and Lugdunensis. I think this is worthwhile initiating systemic treatment.  I will use doxycycline given his long list of allergies. The area on the left heel slightly improved but still requiring debridement. ooThe large wound on the buttock is just about closed whereas the smaller one is larger. Using silver alginate in this area 09/04/2018; patient is completing his doxycycline for the left foot although this continues to be [name] very difficult wound area with very adherent necrotic debris. We are using silver alginate to all his wounds right foot left foot and the small wounds on his buttock, silver collagen on the left heel. 09/11/2018; once again this patient has intense erythema and swelling of the left forefoot. Lesser degrees of erythema in the right foot. He has [name] long list of allergies and intolerances. I will reinstitute doxycycline. oo2 small  areas on the left buttock are all the left of his major stage III pressure ulcer. Using silver alginate ooLeft heel also looks better using silver collagen ooUnfortunately both the areas on his feet look worse. The area on the left first second webspace is now gone through to the plantar part of his foot. The area on the left foot anteriorly is irritated with erythema and swelling in the forefoot. 09/25/2018 ooHis wound on the left plantar heel looks better. Using silver collagen ooThe area on the left buttock 2 small remnant areas. One is closed one is still open. Using silver alginate ooThe areas between both his first and second toes look worse. This in spite of long-standing antifungal therapy with ketoconazole and silver alginate which should have antifungal activity ooHe has small areas around his original wound on the left calf one is on the bottom of the original scar tissue and one superiorly both of these are small and superficial but again given wound history in this site this is worrisome 10/02/2018 ooLeft plantar heel continues to gradually contract using silver collagen ooLeft buttock wound is unchanged using silver alginate ooThe areas on his dorsal feet between his first and second toes bilaterally look about the same. 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 Small open area here. We have been using silver alginate 10/09/2018; ooLeft plantar heel continues to look healthy and contract. No debridement is required ooLeft buttock slightly smaller with [name] tape injury wound just below which was new this week ooDorsal feet somewhat improved I have been using clindamycin ooLeft lateral looks lower extremity the actual open area looks worse although [name] lot of this is epithelialized. I am going to  change to silver collagen today He has [name] lot more swelling in the right leg although this is not pitting not red and not particularly warm there is [name] lot of spasm in the right leg usually indicative of people with paralysis of some underlying discomfort. We have reviewed his vascular status from 2017 he had [name] left greater saphenous vein ablation. I wonder about referring him back to vascular surgery if the area on the left leg continues to deteriorate. 10/16/2018 in today for follow-up and management of multiple lower extremity ulcers. His left Buttock wound is much lower smaller and almost closed completely. The wound to the left ankle has began to reopen with Epithelialization and some adherent slough. He has multiple new areas to the left foot and leg. The left dorsal foot without much improvement. Wound present between left great webspace and 2nd toe. Erythema  and edema present right leg. Right LE ultrasound obtained on 10/10/18 was negative for DVT . 10/23/2018; ooLeft buttock is closed over. Still dry macerated skin but there is no open wound. I suspect this is chronic pressure/moisture ooLeft lateral calf is quite [name] bit worse than when I saw this last. There is clearly drainage here he has macerated skin into the left plantar heel. We will change the primary dressing to alginate ooLeft dorsal foot has some improvement in overall wound area. Still using clindamycin and silver alginate ooRight dorsal foot about the same as the left using clindamycin and silver alginate ooThe erythema in the right leg has resolved. He is DVT rule out was negative ooLeft heel pressure area required debridement although the wound is smaller and the surface is health 10/26/2018 ooThe patient came back in for his nurse check today predominantly because of the drainage coming out of the left lateral leg with [name] recent reopening of his original wound on the left lateral calf. He comes in today with [name] large  amount of surrounding erythema around the wound extending from the calf into the ankle and even in the area on the dorsal foot. He is not systemically unwell. He is not febrile. Nevertheless this looks like cellulitis. We have been using silver alginate to the area. I changed him to [name] regular visit and I am going to prescribe him doxycycline. The rationale here is [name] long list of medication intolerances and [name] history of MRSA. I did not see anything that I thought would provide [name] valuable culture 10/30/2018 ooFollow-up from his appointment 4 days ago with really an extensive area of cellulitis in the left calf left lateral ankle and left dorsal foot. I put him on doxycycline. He has [name] long list of medication allergies which are true allergy reactions. 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 In any case he is [name] lot better today. The erythema especially in the anterior and lateral left calf is better. He still has left ankle erythema. He also is complaining about increasing edema in the right leg we have only been using Kerlix Coban and he has been doing the wraps at home. Finally he has [name] spotty rash on the medial part of his upper left calf which looks like folliculitis or perhaps wrap occlusion type injury. Small superficial macules not pustules 11/06/18 patient arrives today with again [name] considerable degree of erythema around the wound on the left lateral calf extending into the dorsal ankle and dorsal foot. This is [name] lot worse than when I saw this last week. He is on doxycycline really with not [name] lot of improvement. He has not been systemically unwell Wounds on the; left heel actually looks improved. Original area on the left foot and proximity to the first and second toes looks about the same. He has superficial areas on the dorsal foot, anterior calf and then the reopening of his original wound on the left lateral calf which  looks about the same ooThe only area he has on the right is the dorsal webspace first and second which is smaller. ooHe has [name] large area of dry erythematous skin on the left buttock small open area here. 11/13/2018; the patient arrives in much better condition. The erythema around the wound on the left lateral calf is [name] lot better. Not sure whether this was the clindamycin or the TCA and ketoconazole or just in the improvement in  edema control [stasis dermatitis]. In any case this is [name] lot better. The area on the left heel is very small and just about resolved using silver collagen we have been using silver alginate to the areas on his dorsal feet 11/20/2018; his wounds include the left lateral calf, left heel, dorsal aspects of both feet just proximal to the first second webspace. He is stable to slightly improved. I did not think any changes to his dressings were going to be necessary 11/27/2018 he has [name] reopening on the left buttock which is surrounded by what looks like tinea or perhaps some other form of dermatitis. The area on the left dorsal foot has some erythema around it I have marked this area but I am not sure whether this is cellulitis or not. Left heel is not closed. Left calf the reopening is really slightly longer and probably worse 1/13; in general things look better and smaller except for the left dorsal foot. Area on the left heel is just about closed, left buttock looks better only [name] small wound remains in the skin looks better [using Lotrisone] 1/20; the area on the left heel only has [name] few remaining open areas here. Left lateral calf about the same in terms of size, left dorsal foot slightly larger right lateral foot still not closed. The area on the left buttock has no open wound and the surrounding skin looks [name] lot better 1/27; the area on the left heel is closed. Left lateral calf better but still requiring extensive debridements. The area on his left buttock is closed. He  still has the open areas on the left dorsal foot which is slightly smaller in the right foot which is slightly expanded. We have been using Iodoflex on these areas as well 2/3; left heel is closed. Left lateral calf still requiring debridement using Iodoflex there is no open area on his left buttock however he has dry scaly skin over [name] large area of this. Not really responding well to the Lotrisone. Finally the areas on his dorsal feet at the level of the first second webspace are slightly smaller on the right and about the same on the left. Both of these vigorously debrided with Anasept and gauze 2/10; left heel remains closed he has dry erythematous skin over the left buttock but there is no open wound here. Left lateral leg has come in and with. Still requiring debridement we have been using Iodoflex here. Finally the area on the left dorsal foot and right dorsal foot are really about the same extremely dry callused fissured areas. He does not yet have [name] dermatology appointment 2/17; left heel remains closed. He has [name] new open area on the left buttock. The area on the left lateral calf is bigger longer and still covered in necrotic debris. No major change in his foot areas bilaterally. I am awaiting for [name] dermatologist to look on this. We have been using ketoconazole I do not know that this is been doing any good at all. 2/24; left heel remains closed. The left buttock wound that was new reopening last week looks better. The left lateral calf appears better also although still requires debridement. The major area on his foot is the left first second also requiring debridement. We have been putting Prisma on all wounds. I do not believe that the ketoconazole has done too much good for his feet. He will use Lotrisone I am going to give him [name] 2-week course of terbinafine. We still do  not have [name] dermatology appointment 3/2 left heel remains closed however there is skin over bone in this area I pointed  this out to him today. The left buttock wound is epithelialized but still does not look completely stable. The area on the left leg required debridement were using silver collagen here. With regards to his feet we changed to Lotrisone last week and silver alginate. 3/9; left heel remains closed. Left buttock remains closed. The area on the right foot is essentially closed. The left foot remains unchanged. Slightly smaller on the left lateral calf. Using silver collagen to both of these areas 3/16-Left heel remains closed. Area on right foot is closed. Left lateral calf above the lateral malleolus open wound requiring debridement with easy bleeding. Left dorsal wound proximal to first toe also debrided. Left ischial area open new. Patient has been using Prisma with wrapping every 3 days. Dermatology appointment is apparently tomorrow.Patient has completed his terbinafine 2-week course with some apparent improvement according to him, there is still flaking and dry skin in his foot on the left 3/23; area on the right foot is reopened. The area on the left anterior foot is about the same still [name] very necrotic adherent surface. He still has the area on the left leg and reopening is on the left buttock. He apparently saw dermatology although I do not have [name] note. According to the patient who is usually fairly well informed they did not have any good ideas. Put him on oral terbinafine which she is been on before. 3/30; using silver collagen to all wounds. Apparently his dermatologist put him on doxycycline and rifampin presumably some culture grew staph. I do not have this result. He remains on terbinafine although I have used terbinafine on him before 4/6; patient has had [name] fairly substantial reopening on the right foot between the first and second toes. He is finished his terbinafine and I believe is on doxycycline and rifampin still as prescribed by dermatology. We have been using silver collagen to all  his wounds although the patient reports that he thinks silver alginate does better on the wounds on his buttock. 4/13; the area on his left lateral calf about the same size but it did not require debridement. ooLeft dorsal foot just proximal to the webspace between the first and second toes is about the same. Still nonviable surface. I note some superficial bronze discoloration of the dorsal part of his foot ooRight dorsal foot just proximal to the first and second toes also looks about the same. I still think there may be the same discoloration I noted above on the left ooLeft buttock wound looks about the same 4/20; left lateral calf appears to be gradually contracting using silver collagen. 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 The left dorsal foot wound is debrided of tightly adherent necrotic debris and really cleans up quite nicely. The right area is worse with expansion. I did not debride this it is now over the base of the second toe ooThe area on his left buttock is smaller no debridement is required using silver collagen 5/4; left calf continues to make good progress. ooHe arrives with erythema around the wounds on his dorsal foot which even extends to the plantar aspect. Very concerning for coexistent infection. He is finished the erythromycin I gave him for possible erythrasma this does not seem to have helped. ooThe area on the left foot is about the same base of the dorsal  toes ooIs area on the buttock looks improved on the left 5/11; left calf and left buttock continued to make good progress. Left foot is about the same to slightly improved. ooMajor problem is on the right foot. He has not had an x-ray. Deep tissue culture I did last week showed both Enterobacter and E. coli. I did not change the doxycycline I put him on empirically although neither 1 of these were plated to doxycycline. He arrives today with  the erythema looking worse on both the dorsal and plantar foot. Macerated skin on the bottom of the foot. he has not been systemically unwell 5/18-Patient returns at 1 week, left calf wound appears to be making some progress, left buttock wound appears slightly worse than last time, left foot wound looks slightly better, right foot redness is marginally better. X-ray of both feet show no air or evidence of osteomyelitis. Patient is finished his Omnicef and terbinafine. He continues to have macerated skin on the bottom of the left foot as well as right 5/26; left calf wound is better, left buttock wound appears to have multiple small superficial open areas with surrounding macerated skin. X-rays that I did last time showed no evidence of osteomyelitis in either foot. He is finished cefdinir and doxycycline. I do not think that he was on terbinafine. He continues to have [name] large superficial open area on the right foot anterior dorsal and slightly between the first and second toes. I did send him to dermatology 2 months ago or so wondering about whether they would do [name] fungal scraping. I do not believe they did but did do [name] culture. We have been using silver alginate to the toe areas, he has been using antifungals at home topically either ketoconazole or Lotrisone. We are using silver collagen on the left foot, silver alginate on the right, silver collagen on the left lateral leg and silver alginate on the left buttock 6/1; left buttock area is healed. We have the left dorsal foot, left lateral leg and right dorsal foot. We are using silver alginate to the areas on both feet and silver collagen to the area on his left lateral calf 6/8; the left buttock apparently reopened late last week. He is not really sure how this happened. He is tolerating the terbinafine. Using silver alginate to all wounds 6/15; left buttock wound is larger than last week but still superficial. ooCame in the clinic today with [name]  report of purulence from the left lateral leg I did not identify any infection ooBoth areas on his dorsal feet appear to be better. He is tolerating the terbinafine. Using silver alginate to all wounds 6/22; left buttock is about the same this week, left calf quite [name] bit better. His left foot is about the same however he comes in with erythema and warmth in the right forefoot once again. Culture that I gave him in the beginning of May showed Enterobacter and E. coli. I gave him doxycycline and things seem to improve although neither 1 of these organisms was specifically plated. 6/29; left buttock is larger and dry this week. Left lateral calf looks to me to be improved. Left dorsal foot also somewhat improved right foot completely unchanged. The erythema on the right foot is still present. He is completing the Ceftin dinner that I gave him empirically [see discussion above.) 7/6 - All wounds look to be stable and perhaps improved, the left buttock wound is slightly smaller, per patient bleeds easily, completed ceftin, the  right foot redness is less, he is on terbinafine 7/13; left buttock wound about the same perhaps slightly narrower. Area on the left lateral leg continues to narrow. Left dorsal foot slightly smaller right foot about the same. We are using silver alginate on the right foot and Hydrofera Blue to the areas on the left. Unna boot on the left 2 layer compression on the right 7/20; left buttock wound absolutely the same. Area on lateral leg continues to get better. Left dorsal foot require debridement as did the right no major change in the 7/27; left buttock wound the same size necrotic debris over the surface. The area on the lateral leg is closed once again. His left foot looks better right foot about the same although there is some involvement now of the posterior first second toe area. He is still on terbinafine which I have given him for [name] month, not certain [name] centimeter major  change 06/25/19-All wounds appear to be slightly improved according to report, left buttock wound looks clean, both foot wounds have minimal to no debris the right dorsal foot has minimal slough. We are using Hydrofera Blue to the left and silver alginate to the right foot and ischial wound. 8/10-Wounds all appear to be around the same, the right forefoot distal part has some redness which was not there before, however the wound looks clean and small. Ischial wound looks about the same with no changes 8/17; his wound on the left lateral calf which was his original chronic venous insufficiency wound remains closed. Since I last saw him the areas on the left dorsal foot right dorsal foot generally appear better but require debridement. The area on his left initial tuberosity appears somewhat larger to me perhaps hyper granulated and bleeds very easily. We have been using Hydrofera Blue to the left dorsal foot and silver alginate to everything else 8/24; left lateral calf remains closed. The areas on his dorsal feet on the webspace of the first and second toes bilaterally both look better. The area on the left buttock which is the pressure ulcer stage II slightly smaller. I change the dressing to Hydrofera Blue to all areas 8/31; left lateral calf remains closed. The area on his dorsal feet bilaterally look better. Using Hydrofera Blue. Still requiring debridement on the left foot. No change in the left buttock pressure ulcers however 9/14; left lateral calf remains closed. Dorsal feet look quite [name] bit better than 2 weeks ago. Flaking dry skin also [name] lot better with the ammonium lactate I gave him 2 weeks ago. The area on the left buttock is improved. He states that his Roho cushion developed [name] leak and he is getting [name] new one, in the interim he is offloading this vigorously 9/21; left calf remains closed. Left heel which was [name] possible DTI looks better this week. He had macerated tissue around the left  dorsal foot right foot looks satisfactory and improved left buttock wound. I changed his dressings to his feet to silver alginate bilaterally. Continuing Hydrofera Blue on the left buttock. 9/28 left calf remains closed. Left heel did not develop anything [possible DTI] dry flaking skin on the left dorsal foot. Right foot looks satisfactory. Improved left buttock wound. We are using silver alginate on his feet Hydrofera Blue on the buttock. I have asked him to go back to the Lotrisone on his feet including the wounds and surrounding areas 10/5; left calf remains closed. The areas on the left and right feet about the  same. [name] lot of this is epithelialized however debris over the remaining open areas. He is using Lotrisone and silver alginate. The area on the left buttock using Hydrofera Blue 10/26. Patient has been out for 3 weeks secondary to Covid concerns. He tested negative but I think his wife tested positive. He comes in today with the left foot substantially worse, right foot about the same. Even more concerning he states that the area on his left buttock closed over but then reopened and is considerably deeper in one aspect than it was before [stage III wound] 11/2; left foot really about the same as last week. Quarter sized wound on the dorsal foot just proximal to the first second toes. Surrounding erythema with areas of denuded epithelium. This is not really much different looking. Did not look like cellulitis this time however. ooRight foot area about the same.. We have been using silver alginate alginate on his toes ooLeft buttock still substantial irritated skin around the wound which I think looks somewhat better. We have been using Hydrofera Blue here. 11/9; left foot larger than last week and [name] very necrotic surface. Right foot I think is about the same perhaps slightly smaller. Debris around the circumference also addressed. Unfortunately on the left buttock there is been [name] decline.  Satellite lesions below the major wound distally and now [name] an additional one posteriorly we have been using Hydrofera Blue but I think this is [name] pressure issue 11/16; left foot ulcer dorsally again [name] very adherent necrotic surface. Right foot is about the same. Not much change in the pressure ulcer on his left buttock. 11/30; left foot ulcer dorsally basically the same as when I saw him 2 weeks ago. Very adherent fibrinous debris on the wound surface. Patient reports [name] lot of drainage as well. 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 We have been using Iodoflex, patient changed back to alginate because of the drainage. Area on his right dorsal foot really looks benign with [name] healthier surface certainly [name] lot better than on the left. Left buttock wounds all improved using Hydrofera Blue 12/7; left dorsal foot again no improvement. Tightly adherent debris. PCR culture I did last week only showed likely skin contaminant. I have gone ahead and done [name] punch biopsy of this which is about the last thing in terms of investigations I can think to do. He has known venous insufficiency and venous hypertension and this could be the issue here. The area on the right foot is about the same left buttock slightly worse according to our intake nurse secondary to [facility] Blue sticking to the wound 12/14; biopsy of the left foot that I did last time showed changes that could be related to wound healing/chronic stasis dermatitis phenomenon no neoplasm. We have been using silver alginate to both feet. I change the one on the left today to Sorbact and silver alginate to his other 2 wounds 12/28; the patient arrives with the following problems; ooMajor issue is the dorsal left foot which continues to be [name] larger deeper wound area. Still with [name] completely nonviable surface ooParadoxically the area mirror image on the right on the right dorsal foot appears to be getting  better. ooHe had some loss of dry denuded skin from the lower part of his original wound on the left lateral calf. Some of this area looked [name] little vulnerable and for this reason we put him in wrap that on this side this  week ooThe area on his left buttock is larger. He still has the erythematous circular area which I think is [name] combination of pressure, sweat. This does not look like cellulitis or fungal dermatitis 11/26/2019; -Dorsal left foot large open wound with depth. Still debris over the surface. Using Sorbact ooThe area on the dorsal right foot paradoxically has closed over [facility] has [name] reopening on the left ankle laterally at the base of his original wound that extended up into the calf. This appears clean. ooThe left buttock wound is smaller but with very adherent necrotic debris over the surface. We have been using silver alginate here as well The patient had arterial studies done in 2017. He had biphasic waveforms at the dorsalis pedis and posterior tibial bilaterally. ABI in the left was 1.17. Digit waveforms were dampened. He has slight spasticity in the great toes I do not think [name] TBI would be possible 1/11; the patient comes in today with [name] sizable reopening between the first and second toes on the right. This is not exactly in the same location where we have been treating wounds previously. According to our intake nurse this was actually fairly deep but 0.6 cm. The area on the left dorsal foot looks about the same the surface is somewhat cleaner using Sorbact, his MRI is in 2 days. We have not managed yet to get arterial studies. The new reopening on the left lateral calf looks somewhat better using alginate. The left buttock wound is about the same using alginate 1/18; the patient had his ARTERIAL studies which were quite normal. ABI in the right at 1.13 with triphasic/biphasic waveforms on the left ABI 1.06 again with triphasic/biphasic waveforms. It would not have been possible  to have done [name] toe brachial index because of spasticity. We have been using Sorbac to the left foot alginate to the rest of his wounds on the right foot left lateral calf and left buttock 1/25; arrives in clinic with erythema and swelling of the left forefoot worse over the first MTP area. This extends laterally dorsally and but also posteriorly. Still has an area on the left lateral part of the lower part of his calf wound it is eschared and clearly not closed. ooArea on the left buttock still with surrounding irritation and erythema. ooRight foot surface wound dorsally. The area between the right and first and second toes appears better. 2/1; ooThe left foot wound is about the same. Erythema slightly better I gave him [name] week of doxycycline empirically ooRight foot wound is more extensive extending between the toes to the plantar surface ooLeft lateral calf really no open surface on the inferior part of his original wound however the entire area still looks vulnerable ooAbsolutely no improvement in the left buttock wound required debridement. 2/8; the left foot is about the same. Erythema is slightly improved I gave him clindamycin last week. ooRight foot looks better he is using Lotrimin and silver alginate ooHe has [name] breakdown in the left lateral calf. Denuded epithelium which I have removed ooLeft buttock about the same were using Hydrofera Blue 2/15; left foot is about the same there is less surrounding erythema. Surface still has tightly adherent debris which I have debriding however not making any progress ooRight foot has [name] substantial wound on the medial right second toe between the first and second webspace. ooStill an open area on the left lateral calf distal area. ooButtock wound is about the same 2/22; left foot is about the same less surrounding  erythema. Surface has adherent debris. Polymen Ag Right foot area significant wound between the first and second toes. We have  been using silver alginate here Left lateral leg polymen Ag at the base of his original venous insufficiency wound ooLeft buttock some improvement here 3/1; ooRight foot is deteriorating in the first second toe webspace. Larger and more substantial. We have been using silver alginate. ooLeft dorsal foot about the same markedly adherent surface debris using PolyMem Ag ooLeft lateral calf surface debris using PolyMem AG ooLeft buttock is improved again using PolyMem Ag. ooHe is completing his terbinafine. The erythema in the foot seems better. He has been on this for 2 weeks 3/8; no improvement in any wound area in fact he has [name] small open area on the dorsal midfoot which is new this week. He has not gotten his foot x-rays yet 3/15; his x-rays were both negative for osteomyelitis of both feet. No major change in any of his wounds on the extremities however his buttock wounds are better. We have been using polymen on the buttocks, left lower leg. Iodoflex on the left foot and silver alginate on the right 3/22; arrives in clinic today with the 2 major issues are the improvement in the left dorsal foot wound which for once actually looks healthy with [name] nice healthy wound surface without debridement. Using Iodoflex here. Unfortunately on the left lateral calf which is in the distal part of his original wound he came to the clinic here for there was purulent drainage noted some increased breakdown scattered around the original area and [name] small area proximally. We we are using polymen here will change to silver alginate today. His buttock wound on the left is better and I think the area on the right first second toe webspace is also improved 3/29; left dorsal foot looks better. Using Iodoflex. Left ankle culture from deterioration last time grew E. coli, Enterobacter and Enterococcus. I will give him [name] course of cefdinir although that will not cover Enterococcus. The area on the right foot in the  webspace of the first and second toe lateral first toe looks better. The area on his buttock is about healed Vascular appointment is on April 21. This is to look at his venous system vis--vis continued breakdown of the wounds on the left including the left lateral leg and left dorsal foot he. He has had previous ablations on this side 4/5; the area between the right first and second toes lateral aspect of the first toe looks better. Dorsal aspect of the left first toe on the left foot also improved. Unfortunately the left lateral lower leg is larger and there is [name] second satellite wound superiorly. The usual superficial abrasions on the left buttock overall better but certainly not closed 4/12; the area between the right first and second toes is improved. Dorsal aspect of the left foot also slightly smaller with [name] vibrant healthy looking surface. No real change in the left lateral leg and the left buttock wound is healed He has an unaffordable co-pay for Apligraf. Appointment with vein and vascular with regards to the left leg venous part of the circulation is on 4/21 4/19; we continue to see improvement in all wound areas. Although this is minor. He has his vascular appointment on 4/21. The area on the left buttock has not reopened although right in the center of this area the skin looks somewhat threatened 4/26; the left buttock is unfortunately reopened. In general his left dorsal foot has  [name] healthy surface and looks somewhat smaller although it was not measured as such. The area between his first and second toe webspace on the right as [name] small wound against the first toe. The patient saw vascular surgery. The real question I was asking was about the small saphenous vein on the left. He has previously ablated left greater saphenous vein. Nothing further was commented on on the left. Right greater saphenous vein without reflux at the saphenofemoral junction or proximal thigh there was no  indication for ablation of the right greater saphenous vein duplex was negative for DVT bilaterally. They did not think there was anything from [name] vascular surgery point of view that could be offered. They ABIs within normal limits 5/3; only small open area on the left buttock. The area on the left lateral leg which was his original venous reflux is now 2 wounds both which look clean. We are using Iodoflex on the left dorsal foot which looks healthy and smaller. He is down to [name] very tiny area between the right first and second toes, using silver alginate 5/10; all of his wounds appear better. We have much better edema control in 4 layer compression on the left. This may be the factor that is allowing the left foot and left lateral calf to heal. He has external compression garments at home 04/14/20-All of his wounds are progressing well, the left forefoot is practically closed, left ischium appears to be about the same, right toe webspace is also smaller. The left lateral leg is about the same, continue using Hydrofera Blue to this, silver alginate to the ischium, Iodoflex to the toe space on the right 6/7; most of his wounds outside of the left buttock are doing well. The area on the left lateral calf and left dorsal foot are smaller. The area on the right foot in between the first and second toe webspace is barely visible although he still says there is some drainage here is the only reason I did not heal this out. ooUnfortunately the area on the left buttock almost looks like he has [name] skin tear from tape. He has open wound and then [name] large flap of skin that we are trying to get adherence over an area just next to the remaining wound 6/21; 2 week follow-up. I believe is been here for nurse visits. Miraculously the area between his first and second toes on the left dorsal foot is closed over. Still open on the right first second web space. The left lateral calf has 2 open areas. Distally this is more  superficial. The proximal area had [name] little more depth and required debridement of adherent necrotic material. His buttock wound is actually larger we have been using silver alginate here 6/28; the patient's area on the left foot remains closed. Still open wet area between the first and second toes on the right and also extending into the plantar aspect. We have been using silver alginate in this location. He has 2 areas on the left lower leg part of his original long wounds which I think are better. We have been using Hydrofera Blue here. Hydrofera Blue to the left buttock which is stable 7/12; left foot remains closed. Left ankle is closed. May be [name] small area between his right first and second toes the only truly open area is on the left buttock. We have been using Hydrofera Blue here 7/19; patient arrives with marked deterioration especially in the left foot and ankle. We did not  put him in [name] compression wrap on the left last week in fact he wore his juxta lite stockings on either side although he does not have an underlying stocking. He has [name] reopening on the left dorsal foot, left lateral ankle and [name] new area on the right dorsal ankle. More worrisome is the degree of erythema on the left foot extending on the lateral foot into the lateral lower leg on the left 7/26; the patient had erythema and drainage from the lateral left ankle last week. Culture of this grew MRSA resistant to doxycycline and clindamycin which are the 2 antibiotics we usually use with this patient who has multiple antibiotic allergies including linezolid, trimethoprim sulfamethoxazole. I had give him an empiric doxycycline and he comes in the area certainly looks somewhat better although it is blotchy in his lower leg. He has not been systemically unwell. He has had areas on the left dorsal foot which is [name] reopening, chronic wounds on the left lateral ankle. Both of these I think are secondary to chronic venous insufficiency.  The area between his first and second toes is closed as far as I can tell. He had [name] new wrap injury on the right dorsal ankle last week. Finally he has an area on the left buttock. We have been using silver alginate to everything except the left buttock we are using Hydrofera Blue 06/30/20-Patient returns at 1 week, has been given [name] sample dose pack of NUZYRA which is [name] tetracycline derivative [omadacycline], patient has completed those, we have been using silver alginate to almost all the wounds except the left ischium where we are using Hydrofera Blue all of them look better 8/16; since I last saw the patient he has been doing well. The area on the left buttock, left lateral ankle and left foot are all closed today. He has completed the [place] I gave him last time and tolerated this well. He still has open areas on the right dorsal ankle and in the right first second toe area which we are using silver alginate. 8/23; we put him in his bilateral external compression stockings last week as he did not have anything open on either leg except for concerning area between the right first and second toe. He comes in today with an area on the left dorsal foot slightly more proximal than the original wound, the left lateral foot but this is actually [name] continuation of the area he had on the left lateral ankle from last time. As well he is opened up on the left buttock again. 8/30; comes in today with things looking [name] lot better. The area on the left lower ankle has closed down as has the left foot but with eschar in both areas. The area on the dorsal right ankle is also epithelialized. Very little remaining of the left buttock wound. We have been using silver alginate on all wound areas 9/13; the area in the first second toe webspace on the right has fully epithelialized. He still has some vulnerable epithelium on the right and the ankle and the dorsal foot. He notes weeping. He is using his juxta lite  stocking. On the left again the left dorsal foot is closed left lateral ankle is closed. We went to the juxta lite stocking here as well. ooStill vulnerable in the left buttock although only 2 small open areas remain here 9/27; 2-week follow-up. We did not look at his left leg but the patient says everything is closed. He is [name]  bit disturbed by the amount of edema in his left foot he is using juxta lite stockings but asking about over the toes stockings which would be 30/40, will talk to him next time. According to him there is no open wound on either the left foot or the left ankle/calf He has an open area on the dorsal right calf which I initially point [name] wrap injury. He has superficial remaining wound on the left ischial tuberosity been using silver alginate although he says this sticks to the wound 10/5; we gave him 2-week follow-up but he called yesterday expressing some concerns about his right foot right ankle and the left buttock. He came in early. There is still no open areas on the left leg and that still in his juxta lite stocking 10/11; he only has 1 small area on the left buttock that remains measuring millimeters 1 mm. Still has the same irritated skin in this area. We recommended zinc oxide when this eventually closes and pressure relief is meticulously is he can do this. He still has an area on the dorsal part of his right first through third toes which is [name] bit irritated and still open and on the dorsal ankle near the crease of the ankle. We have been using silver alginate and using his own stocking. He has nothing open on the left leg or foot 10/25; 2-week follow-up. Not nearly as good on the left buttock as I was hoping. For open areas with 5 looking threatened small. He has the erythematous irritated chronic skin in this area. oo1 area on the right dorsal ankle. He reports this area bleeds easily ooRight dorsal foot just proximal to the base of his toes ooWe have been using  silver alginate. 11/8; 2-week follow-up. Left buttock is about the same although I do not think the wounds are in the same location we have been using silver alginate. I have asked him to use zinc oxide on the skin around the wounds. ooHe still has [name] small area on the right dorsal ankle he reports this bleeds easily ooRight dorsal foot just proximal to the base of the toes does not have anything open although the skin is very dry and scaly ooHe has [name] new opening on the nailbed of the left great toe. Nothing on the left ankle 11/29; 3-week follow-up. Left buttock has 2 open areas. And washing of these wounds today started bleeding easily. Suggesting very friable tissue. We have been using silver alginate. Right dorsal ankle which I thought was initially [name] wrap injury we have been using silver alginate. Nothing open between the toes that I can see. He states the area on the left dorsal toe nailbed healed after the last visit in 2 or 3 days 12/13; 3-week follow-up. His left buttock now has 3 open areas but the original 2 areas are smaller using polymen here. Surrounding skin looks better. The right dorsal ankle is closed. He has [name] small opening on the right dorsal foot at the level of the third toe. In general the skin looks better here. He is wearing his juxta lite stocking on the left leg says there is nothing open 11/24/2020; 3 weeks follow-up. His left buttock still has the 3 open areas. We have been using polymen but due to lack of response he changed to [facility] area. Surrounding skin is dry erythematous and irritated looking. There is no evidence of infection either bacterial or fungal however there is loss of surface epithelium ooHe still  has very dry skin in his foot causing irritation and erythema on the dorsal part of his toes. This is not responded to prolonged courses of antifungal simply looks dry and irritated 1/24; left buttock area still looks about the same he was unable to  find the triad ointment that we had suggested. The area on the right lower leg just above the dorsal ankle has reopened and the areas on the right foot between the first second and second third toes and scaling on the bottom of the foot has been about the same for quite some time now. been using silver alginate to all wound areas 2/7; left buttock wound looked quite good although not much smaller in terms of surface area surrounding skin looks better. Only [name] few dry flaking areas on the right foot in between the first and second toes the skin generally looks better here [ammonium lactate]. Finally the area on the right dorsal ankle is closed 2/21; ooThere is no open area on the right foot even between the right first and second toe. Skin around this area dorsally and plantar aspects look better. ooHe has [name] reopening of the area on the right ankle just above the crease of the ankle dorsally. I continue to think that this is probably friction from spasms may be even this time with his stocking under the compression stockings. ooWounds on his left buttock look about the same there [name] couple of areas that have reopened. He has [name] total square area of loss of epithelialization. This does not look like infection it looks like [name] contact dermatitis but I just cannot determine to what 3/14; there is nothing on the right foot between the first and second toes this was carefully inspected under illumination. Some chronic irritation on the dorsal part of his foot from toes 1-3 at the base. Nothing really open here substantially. Still has an area on the right foot/ankle that is actually larger and hyper granulated. His buttock area on the left is just about closed however he has chronic inflammation with loss of the surface epithelial layer 3/28; 2-week follow-up. In clinic today with [name] new wound on the left anterior mid tibia. Says this happened about 2 weeks ago. He is not really sure how wonders about the  spasticity of his legs at night whether that could have caused this other than that he does not have [name] good idea. He has been using topical antibiotics and silver alginate. The area on his right dorsal ankle seems somewhat better. ooFinally everything on his left buttock is closed. 4/11; 2-week follow-up. All of his wounds are better except for the area over the ischium and left buttock which have opened up widely again. At least part of this is covered in necrotic fibrinous material another part had rolled nonviable skin. The area on the right ankle, left anterior mid tibia are both [name] lot better. He had no open wounds on either foot including the areas between the first and second toes 4/25; patient presents for 2-week follow-up. He states that the wounds are overall stable. He has no complaints today and states he is using Hydrofera Blue to open wounds. 5/9; have not seen this man in over [name] month. For my memory he has open areas on the left mid tibia and right ankle. T oday he has new open area on the right dorsal foot which we have not had [name] problem with recently. He has the sustained area on the left buttock He is  also changed his insurance at the beginning of the year [employer]. We will need prior authorizations for debridement 5/23; patient presents for 2-week follow-up. He has prior authorizations for debridement. He denies any issues in the past 2 weeks with his wound care. He has been using Hydrofera Blue to all the wounds. He does report [name] circular rash to the upper left leg that is new. He denies acute signs of infection. 6/6; 2-week follow-up. The patient has open wounds on the left buttock which are worse than the last time I saw this about [name] month ago. He also has [name] new area to me on the left anterior mid tibia with some surrounding erythema. The area on the dorsal ankle on the right is closed but I think this will be [name] friction injury every time this area is exposed to either our  wraps or his compression stockings caused by unrelenting spasms in this leg. 6/20; 2-week follow-up. oo The patient has open wounds on the left buttock which is about the same. Using [facility] here. - The left mid tibia has [name] static amount of surrounding erythema. Also [name] raised area in the center. We have been using Hydrofera Blue here. ooooFinally he has broken down in his dorsal right foot extending between the first and second toes and going to the base of the first and second toe webspace. I have previously assumed that this was severe venous hypertension 7/5; 2-week follow-up oo The left buttock wound actually looks better. We are using Hydrofera Blue. He has extensive skin irritation around this area and I have not really been able to get that any better. I have tried Lotrisone i.e. antifungals and steroids. More most recently we have just been using Coloplast really looks about the same. oo The left mid tibia which was new last week culture to have very resistant staph aureus. Not only methicillin-resistant but doxycycline resistant. The patient has [name] plethora of antibiotic allergies including sulfa, linezolid. I used topical bacitracin on this but he has not started this yet. oo In addition he has an expanding area of erythema with [name] wound on the dorsal right foot. I did [name] deep tissue culture of this area today 7/12; oo Left buttock area actually looks better surrounding skin also looks less irritated. oo Left mid tibia looks about the same. He is using bacitracin this is not worse oo Right dorsal foot looks about the same as well. oo The left first toe also looks about the same 7/19; left buttock wound continues to improve in terms of open areas oo Left mid tibia is still concerning amount of swelling he is using bacitracin oo Dorsal left first toe somewhat smaller oo Right dorsal foot somewhat smaller 7/25; left buttock wound actually continues to improve oo Left  mid tibia area has less swelling. I gave him all my samples of new Nuzyra. This seems to have helped although the wound is still open it. His abrasion closed by here oo Left dorsal great toe really no better. Still [name] very nonviable surface oo Right dorsal foot perhaps some better. oo We have been using bacitracin and silver nitrate to the areas on his lower legs and Hydrofera Blue to the area on the buttock. 8/16 oo Disappointed that his left buttock wound is actually more substantial. Apparently during the last nurse visit these were both very small. He has continued irritation to [name] large area of skin on his buttock. I have never been able to totally  explain this although I think it some combination of the way he sits, pressure, moisture. He is not incontinent enough to contribute to this. oo Left dorsal great toe still fibrinous debris on the surface that I have debrided today oo Large area across the dorsal right toes. oo The area on the left anterior mid tibia has less swelling. He completed the [place]. This does not look infected although the tissue is still fried 8/30; 2-week follow-up. oo Left buttock areas not improved. We used Hydrofera Blue on this. Weeping wet with the surrounding erythema that I have not been able to control even with Lotrisone and topical Coloplast oo Left dorsal great toe looks about the same oo More substantial area again at the base of his toes on the left which is new this week. oo Area across the dorsal right toes looks improved oo The left anterior mid tibia looks like it is trying to close 9/13; 2-week follow-up. Using silver alginate on all of his wounds. The left dorsal foot does not look any better. He has the area on the dorsal toe and also the areas at the base of all of his toes 1 through 3. On the right foot he has [name] similar pattern in [name] similar area. He has the area on his left mid tibia that looks fairly healthy. Finally the area on his  left buttock looks somewhat bette 9/20; culture I did of the left foot which was [name] deep tissue culture last time showed E. coli he has erythema around this wound. Still [name] completely necrotic surface. His right dorsal foot looks about the same. He has [name] very friable surface to the left anterior mid tibia. Both buttock wounds look better. We have been using silver alginate to all wounds 10/4; he has completed the cephalexin that I gave him last time for the left foot. He is using topical gentamicin under silver alginate silver alginate being applied to all the wounds. Unfortunately all the wounds look irritated on his dorsal right foot dorsal left foot left mid tibia. I wonder if this could be [name] silver allergy. I am going to change him to [facility] on the lower extremity. The skin on the left buttock and left posterior thigh still flaking dry and irritated. This has continued no matter what I have applied topically to this. He has [name] solitary open wound which by itself does not look too bad however the entire area of surrounding skin does not change no matter what we have applied here 10/18; the area on the left dorsal foot and right dorsal foot both look better. The area on the right extends into the plantar but not between his toes. We have been using silver alginate. He still has [name] rectangular erythematous area around the area on the left tibia. The wound itself is very small. Finally everything on his left buttock looks [name] little larger the skin is erythematous 11/15; patient comes in with the left dorsal and right dorsal foot distally looking somewhat better. Still nonviable surface on the left foot which required debridement. He still has the area on the left anterior mid tibia although this looks somewhat better. He has [name] new area on the right lateral lower leg just above the ankle. Finally his left buttock looks terrible with multiple superficial open wounds geometric square shaped area of  chronic erythema which I have not been able to sort through 11/29; right dorsal foot and left dorsal foot both look somewhat better. No  debridement required. He has the fragile area on the left anterior mid tibia this looks and continues to look somewhat better. Right lateral lower leg just above the ankle we identified last time also looks better. In general the area on his left buttock looks improved. We are using Hydrofera Blue to all wound areas 12/13; right dorsal foot looks better. The area on the right lateral leg is healed. Left dorsal foot has 2 open areas both of which require debridement. The fragile area on the left anterior mid tibial looks better. Smaller area on his buttocks. Were using Hydrofera Blue 1/10; patient comes in with everything looking slightly larger and/or worse. This includes his left buttock, reopening of the left mid tibia, larger areas on the left dorsal foot and what looks to be [name] cellulitis on the right dorsal and plantar foot. We have been using Hydrofera Blue on all wounds. 1/17; right dorsal foot distally looks better today. The left foot has 2 open wounds that are about the same surrounding erythema. 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 No malignancy they did not stain for fungus 1/24; his right distal foot is not closed dry and scaly but the wound looks like it is contracting. I did not debride anything here. Problem on his left dorsal foot with expanding erythema. Apparently there were problems last week getting the Elesa Hacker however it is now available at the [facility] but [name] week later. He is using ketoconazole and Coloplast to the left buttock along with Hydrofera Blue this actually looks quite [name] bit better today. 1/31; right dorsal foot again is dry and scaly but looking to contract. He has been using [name]  moisturizer on his feet at my request but he is not sure which 1. The left dorsal foot wounds look about the same there is erythema here that I marked last week however after course of Nuzyra it certainly is not any better but not any worse either. Finally on his left buttock the skin continues to look better he has the original wound but [name] new substantial area towards the gluteal cleft. Almost like [name] skin tear. I used scissors to remove skin and subcutaneous tissue here silver nitrate and direct pressure 2/7; right dorsal foot. This does not look too much different from last week. Some erythema skin dry and scaly. No debridement. Left dorsal great toe again still not much improvement. I did remove flaking dry skin and callus from around the edge. Finally on his left buttock. The skin is somewhat better in the periwound. Surface wounds are superficial somewhat better than last week. 01/26/2022: Is [name] little bit of [name] mystery as to why his wounds fail to respond to treatments and actually seem to get worse. This is my first encounter with this patient. He was previously followed by Dr. Dellia Nims. Based upon my review of the chart, it seems that there is [name] little bit of [name] mystery as to why his wounds do not respond as anticipated to the interventions applied and sometimes even get worse. Biopsies have been performed and he was seen by dermatology in [place], but that did not shed any light on the matter. T oday, his gluteal wound is larger, with substantial drainage, rather malodorous. The food wounds are not terrible, but he has [name] lot of callus and scaly skin around these. He is currently getting silver alginate on  the gluteal wound, with idodoflex to the feet. He is using lotrisone on his legs for the dry, scaly skin. 02/09/2022: There has really been no change to any of his wounds. The gluteal wound less drainage and odor, but remains about the same size, the periwound skin remains oddly scaly. His lower  extremity wounds also appear roughly the same size. They continue to accumulate [name] small amount of slough. The periwound on his feet and ankle wounds has dry eschar and loose dead skin. We have been using silver alginate on the gluteal wound and Iodoflex on his feet and ankle wounds. T the periwound around his gluteal lesion and Lotrisone on his feet and legs. o 02/23/2022: The right plantar foot wound is closed. The gluteal site looks small but has continued to produce hypertrophic granulation tissue. The foot wounds all look about the same on the dorsal surface of the right foot; on the left, there is only [name] small open area at the site of where his left great toenail would have been. 03/16/2022: The right ankle wound is healed. The right dorsal foot wound is about the same. The left dorsal foot wound is quite [name] bit smaller and the ischial wound is nearly closed. 03/30/2022: The right ankle wound reopened. Both dorsal foot wounds are quite [name] bit smaller. Unfortunately, he appears to have sheared part of his ischial wound open further, perhaps during [name] transfer. 04/13/2022: The right ankle wound has hypertrophic granulation tissue present. The dorsal foot wounds continue to decrease in size. The ischial wound looks about the same today, no better, no worse. 04/27/2022: The right ankle wound has closed. Unfortunately, it looks like some moisture got underneath the dry skin on both of his dorsal feet and these wounds have expanded in size. The ischial wound remains the same with perhaps [name] little bit more slough accumulation than at our previous visit. 05/11/2022: The right ankle wound remains closed. There is [name] left anterior tibial wound that is small has patchy openings with accumulated slough. The dorsum of his right foot appears to be nearly healed with just [name] small punctate opening. The plantar surface of his right foot has [name] new opening that looks like he may have picked some skin there. His sacral ulcer  has hypertrophic granulation tissue but has some slough accumulation. The dorsum of his left foot has multiple open areas in [name] fairly ragged distribution. All of these have slough accumulated within them. 06/01/2022: The right ankle and left anterior tibial wound are both closed. Dorsum of his right foot and left foot both look substantially better with just tiny scattered openings The without any slough accumulation. He has sheared open new areas on his left gluteus and ischium. He says that his wheelchair cushion, which is air-filled, has [name] leak and so it keeps deflating. He is awaiting [name] new cushion. 06/15/2022: The right ankle wound has reopened and the fat layer is exposed. Both dorsal feet have just small openings with just [name] little bit of slough and eschar accumulation. The wound on his left gluteus and ischium is larger again today and has [name] foul odor. 06/29/2022: The right ankle wound has hypertrophic granulation tissue buildup. His dorsal foot wounds both look better with just some eschar on the surface. He has [name] new wound on his left lateral ankle. He is not sure how he acquired it but by appearance, it looks that he hit it on something, potentially his wheelchair or bed. The ischial wound is about  the same but is cleaner without any significant purulent drainage or odor. He did not understand what the [facility] call was about and therefore he does not have the topical compounded antibiotic. 07/13/2022: The right ankle wound again has hypertrophic granulation tissue, but less so than at his previous visit. The ischial wound has improved tremendously the use of the [facility] topical antibiotic. No significant change to the left lateral ankle wound; it is fibrotic with slough present. The skin of both of his feet, especially on the right, has [name] yeasty appearance. 08/10/2022: There is again hypertrophic granulation tissue on the right anterior ankle wound. Both feet are about the same. The left lateral  ankle wound is [name] little bit desiccated and has some slough buildup. He unfortunately suffered [name] new injury when he was removing his pants and they caught his bandage which caused [name] large skin tear on his left ischium, just distal to the existing wound. The existing left ischial wound, however, is significantly better with just [name] little light slough on the surface. Patient History Information obtained from Patient. Family History Diabetes - Father, Hypertension - Mother, No family history of Cancer, Heart Disease, Hereditary Spherocytosis, Kidney Disease, Lung Disease, Stroke, Thyroid Problems, Tuberculosis. Social History Former smoker, Marital Status - Married, Alcohol Use - Moderate, Drug Use - No History, Caffeine Use - Daily. Medical History Ear/Nose/Mouth/Throat Denies history of Chronic sinus problems/congestion, Middle ear problems Hematologic/Lymphatic Denies history of Anemia, Hemophilia, Human Immunodeficiency Virus, Lymphedema, Sickle Cell Disease Respiratory Patient has history of Sleep Apnea - does not tolerate cpap Denies history of Aspiration, Asthma, Chronic Obstructive Pulmonary Disease (COPD), Pneumothorax, Tuberculosis Cardiovascular Patient has history of Hypertension - lisinopril HCTZ Denies history of Angina, Arrhythmia, Congestive Heart Failure, Coronary Artery Disease, Deep Vein Thrombosis, Hypotension, Myocardial Infarction, Peripheral Arterial Disease, Peripheral Venous Disease, Phlebitis, Vasculitis Gastrointestinal Denies history of Cirrhosis , Colitis, Crohnoos, Hepatitis [name], Hepatitis B, Hepatitis C Endocrine Denies history of Type I Diabetes, Type II Diabetes Genitourinary Denies history of End Stage Renal Disease Immunological Denies history of Lupus Erythematosus, Raynaudoos, Scleroderma Integumentary (Skin) Denies history of History of Burn Musculoskeletal Denies history of Gout, Rheumatoid Arthritis, Osteoarthritis,  Osteomyelitis Neurologic Patient has history of Paraplegia Denies history of Dementia, Neuropathy, Quadriplegia, Seizure Disorder Oncologic Denies history of Received Chemotherapy, Received Radiation Psychiatric Denies history of Anorexia/bulimia, Confinement Anxiety Hospitalization/Surgery History - cellulitis in leg. - left leg vein ablation. Objective Constitutional Tachycardic, asymptomatic.Marland Kitchen No acute distress.. Vitals Time Taken: 8:18 AM, Height: 70 in, Weight: 216 lbs, BMI: 31, Temperature: 98.7 F, Pulse: 113 bpm, Respiratory Rate: 16 breaths/min, Blood Pressure: 136/72 mmHg. Respiratory Normal work of breathing on room air.. General Notes: 08/10/2022: There is again hypertrophic granulation tissue on the right anterior ankle wound. Both feet are about the same. The left lateral ankle wound is [name] little bit desiccated and has some slough buildup. He unfortunately suffered [name] new injury when he was removing his pants and they caught his bandage which caused [name] large skin tear on his left ischium, just distal to the existing wound. There is hanging tissue present and the wound is oozing. The existing left ischial wound, however, is significantly better with just [name] little light slough on the surface. Integumentary (Hair, Skin) Wound #41R status is Open. Original cause of wound was Gradually Appeared. The date acquired was: 03/16/2020. The wound has been in treatment 125 weeks. The wound is located on the Left Ischium. The wound measures 2.5cm length x 1cm width x 0.1cm depth; 1.963cm^2 area  and 0.196cm^3 volume. There is Fat Layer (Subcutaneous Tissue) exposed. There is no tunneling or undermining noted. There is [name] medium amount of purulent drainage noted. The wound margin is distinct with the outline attached to the wound base. There is medium (34-66%) red, pink granulation within the wound bed. There is [name] medium (34-66%) amount of necrotic tissue within the wound bed including Adherent  Slough. Wound #52 status is Open. Original cause of wound was Gradually Appeared. The date acquired was: 03/27/2021. The wound has been in treatment 71 weeks. The wound is located on the Right,Dorsal Foot. The wound measures 1cm length x 3cm width x 0.1cm depth; 2.356cm^2 area and 0.236cm^3 volume. There is Fat Layer (Subcutaneous Tissue) exposed. There is no tunneling or undermining noted. There is [name] medium amount of serous drainage noted. The wound margin is flat and intact. There is small (1-33%) red granulation within the wound bed. There is [name] large (67-100%) amount of necrotic tissue within the wound bed including Eschar. Wound #56 status is Open. Original cause of wound was Gradually Appeared. The date acquired was: 07/11/2021. The wound has been in treatment 55 weeks. The wound is located on the Left,Dorsal Foot. The wound measures 2cm length x 2cm width x 0.1cm depth; 3.142cm^2 area and 0.314cm^3 volume. There is Fat Layer (Subcutaneous Tissue) exposed. There is no tunneling or undermining noted. There is [name] medium amount of serous drainage noted. The wound margin is flat and intact. There is medium (34-66%) red, pink granulation within the wound bed. There is [name] medium (34-66%) amount of necrotic tissue within the wound bed including Eschar and Adherent Slough. Wound #65 status is Open. Original cause of wound was Pressure Injury. The date acquired was: 05/24/2022. The wound has been in treatment 10 weeks. The wound is located on the Left Upper Leg. The wound measures 3.6cm length x 1cm width x 0.1cm depth; 2.827cm^2 area and 0.283cm^3 volume. There is Fat Layer (Subcutaneous Tissue) exposed. There is no tunneling or undermining noted. There is [name] medium amount of purulent drainage noted. The wound margin is flat and intact. There is large (67-100%) red, pink granulation within the wound bed. There is [name] small (1-33%) amount of necrotic tissue within the wound bed including Adherent Slough. Wound #66  status is Open. Original cause of wound was Pressure Injury. The date acquired was: 06/15/2022. The wound has been in treatment 8 weeks. The wound is located on the Right,Anterior Ankle. The wound measures 2.2cm length x 1.2cm width x 0.1cm depth; 2.073cm^2 area and 0.207cm^3 volume. There is Fat Layer (Subcutaneous Tissue) exposed. There is no tunneling or undermining noted. There is [name] medium amount of serosanguineous drainage noted. The wound margin is flat and intact. There is large (67-100%) red, friable granulation within the wound bed. There is no necrotic tissue within the wound bed. Wound #67 status is Open. Original cause of wound was Gradually Appeared. The date acquired was: 06/22/2022. The wound has been in treatment 6 weeks. The wound is located on the Left,Lateral Lower Leg. The wound measures 4cm length x 1.7cm width x 0.1cm depth; 5.341cm^2 area and 0.534cm^3 volume. There is [name] medium amount of serosanguineous drainage noted. Wound #68 status is Open. Original cause of wound was Trauma. The date acquired was: 08/08/2022. The wound is located on the Left,Medial Upper Leg. The wound measures 3cm length x 2cm width x 0.1cm depth; 4.712cm^2 area and 0.471cm^3 volume. There is Fat Layer (Subcutaneous Tissue) exposed. There is no tunneling or undermining noted.  There is [name] medium amount of sanguinous drainage noted. There is small (1-33%) red granulation within the wound bed. There is [name] large (67-100%) amount of necrotic tissue within the wound bed including Eschar and Adherent Slough. 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 There was [name] Selective/Open Wound Non-Viable Tissue Debridement with [name] total area of 2.5 sq cm performed by Fredirick Maudlin, MD. With the following instrument(s): Curette to remove Non-Viable tissue/material. Material removed includes [facility]. No specimens were taken. [name] time out was conducted at 08:51, prior to the start of the procedure. [name] Minimum amount of bleeding was controlled with Pressure. The procedure was tolerated well with [name] pain level of 0 throughout and [name] pain level of 0 following the procedure. Post Debridement Measurements: 2.5cm length x 1cm width x 0.1cm depth; 0.196cm^3 volume. Post debridement Stage noted as Category/Stage II. Character of Wound/Ulcer Post Debridement requires further debridement. Post procedure Diagnosis Wound #41R: Same as Pre-Procedure General Notes: Scribed for Dr. Celine Ahr by Blanche East, RN. Wound #52 Pre-procedure diagnosis of Wound #52 is [name] Trauma, Other located on the Right,Dorsal Foot . There was [name] Selective/Open Wound Non-Viable Tissue Debridement with [name] total area of 3 sq cm performed by Fredirick Maudlin, MD. With the following instrument(s): Curette to remove Non-Viable tissue/material. Material removed includes Eschar. No specimens were taken. [name] time out was conducted at 08:51, prior to the start of the procedure. [name] Minimum amount of bleeding was controlled with Pressure. The procedure was tolerated well with [name] pain level of 0 throughout and [name] pain level of 0 following the procedure. Post Debridement Measurements: 1cm length x 3cm width x 0.1cm depth; 0.236cm^3 volume. Character of Wound/Ulcer Post Debridement requires further debridement. Post procedure Diagnosis Wound #52: Same as  Pre-Procedure General Notes: Scribed for Dr. Celine Ahr by Blanche East, RN. 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 There was [name] Selective/Open Wound Non-Viable Tissue Debridement with [name] total area of 4 sq cm performed by Fredirick Maudlin, MD. With the following instrument(s): Curette to remove Non-Viable tissue/material. Material removed includes Eschar. No specimens were taken. [name] time out was conducted at 08:51, prior to the start of the procedure. [name] Minimum amount of bleeding was controlled with Pressure. The procedure was tolerated well with [name] pain level of 0 throughout and [name] pain level of 0 following the procedure. Post Debridement Measurements: 2cm length x 2cm width x 0.1cm depth; 0.314cm^3 volume. Character of Wound/Ulcer Post Debridement requires further debridement. Post procedure Diagnosis Wound #56: Same as Pre-Procedure General Notes: Scribed for Dr. Celine Ahr by Blanche East, RN. Wound #65 Pre-procedure diagnosis of Wound #65 is [name] Pressure Ulcer located on the Left Upper Leg . There was [name] Selective/Open Wound Non-Viable Tissue Debridement with [name] total area of 3.6 sq cm performed by Fredirick Maudlin, MD. With the following instrument(s): Curette to remove Non-Viable tissue/material. Material removed includes [facility].  No specimens were taken. [name] time out was conducted at 08:51, prior to the start of the procedure. [name] Moderate amount of bleeding was controlled with Silver Nitrate. The procedure was tolerated well with [name] pain level of 0 throughout and [name] pain level of 0 following the procedure. Post Debridement Measurements: 3.6cm length x 1cm width x 0.1cm depth; 0.283cm^3 volume. Post debridement Stage noted as Category/Stage III. Character of Wound/Ulcer Post Debridement requires further debridement. Post procedure Diagnosis Wound #65: Same as Pre-Procedure General Notes: Scribed for Dr. Celine Ahr by Blanche East,  RN. 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 There was [name] Selective/Open Wound Non-Viable Tissue Debridement with [name] total area of 6.8 sq cm performed by Fredirick Maudlin, MD. With the following instrument(s): Curette to remove Non-Viable tissue/material. Material removed includes Eschar. No specimens were taken. [name] time out was conducted at 08:51, prior to the start of the procedure. [name] Minimum amount of bleeding was controlled with Pressure. The procedure was tolerated well with [name] pain level of 0 throughout and [name] pain level of 0 following the procedure. Post Debridement Measurements: 4cm length x 1.7cm width x 0.1cm depth; 0.534cm^3 volume. Character of Wound/Ulcer Post Debridement requires further debridement. Severity of Tissue Post Debridement is: Fat layer exposed. Post procedure Diagnosis Wound #67: Same as Pre-Procedure General Notes: Scribed for Dr. Celine Ahr by Blanche East, RN. Wound #68 Pre-procedure diagnosis of Wound #68 is an Abrasion located on the Left,Medial Upper Leg . There was [name] Selective/Open Wound Skin/Epidermis Debridement with [name] total area of 6 sq cm performed by Fredirick Maudlin, MD. With the following instrument(s): Curette, Forceps, and Scissors to remove Non-Viable tissue/material. Material removed includes Eschar and Skin: Epidermis and. No specimens were taken. [name] time out was conducted at 08:51, prior to the start of the procedure. [name] Moderate amount of bleeding was controlled with Silver Nitrate. The procedure was tolerated well with [name] pain level of 0 throughout and [name] pain level of 0 following the procedure. Post Debridement Measurements: 3cm length x 2cm width x 0.1cm depth; 0.471cm^3 volume. Character of Wound/Ulcer Post Debridement requires further debridement. Post procedure Diagnosis Wound #68: Same as Pre-Procedure General Notes: Scribed for Dr.  Celine Ahr by Blanche East, RN. Wound #66 Pre-procedure diagnosis of Wound #66 is [name] Venous Leg Ulcer located on the Right,Anterior Ankle . An Chemical Cauterization procedure was performed by Fredirick Maudlin, MD. Post procedure Diagnosis Wound #66: Same as Pre-Procedure Plan Follow-up Appointments: Return appointment in 3 weeks. - Dr Celine Ahr - Room 1 Bathing/ Shower/ Hygiene: May shower and wash wound with soap and water. - on days that dressing is changed Edema Control - Lymphedema / SCD / Other: Elevate legs to the level of the heart or above for 30 minutes daily and/or when sitting, [name] frequency of: - throughout the day Moisturize legs daily. - using Aquaphor generously to both legs and feet with dressing changes Compression stocking or Garment 30-40 mm/Hg pressure to: - Juxtalite to both legs daily Off-Loading: Roho cushion for wheelchair Turn and reposition every 2 hours WOUND #41R: - Ischium Wound Laterality: Left Cleanser: Soap and Water Every Other Day/30 Days Discharge Instructions: May shower and wash wound with dial antibacterial soap and water prior to dressing change. Peri-Wound Care: Zinc Oxide Ointment 30g tube Every Other Day/30 Days Discharge Instructions: Apply Zinc Oxide to periwound with each dressing change Topical: Keystone antibiotic compound Every Other  Day/30 Days Discharge Instructions: thin layer to wound bed, use daily once available Prim Dressing: KerraCel Ag Gelling Fiber Dressing, 4x5 in (silver alginate) (Generic) Every Other Day/30 Days ary Discharge Instructions: Apply silver alginate to wound bed as instructed Secondary Dressing: ALLEVYN Gentle Border, 4x4 (in/in) Every Other Day/30 Days Discharge Instructions: Apply over primary dressing as directed. WOUND #52: - Foot Wound Laterality: Dorsal, Right Cleanser: Soap and Water Every Other Day/30 Days Discharge Instructions: May shower and wash wound with dial antibacterial soap and water prior to dressing  change. Peri-Wound Care: Triamcinolone 15 (g) Every Other Day/30 Days Discharge Instructions: Use triamcinolone 15 (g) as directed Peri-Wound Care: Sween Lotion (Moisturizing lotion) Every Other Day/30 Days Discharge Instructions: Apply Aquaphor moisturizing lotion as directed Peri-Wound Care: Lotrisone Every Other Day/30 Days Discharge Instructions: or antifungal spray, Apply Lotrisone to periwound Prim Dressing: KerraCel Ag Gelling Fiber Dressing, 2x2 in (silver alginate) Every Other Day/30 Days ary Discharge Instructions: Apply silver alginate to wound bed as instructed Secondary Dressing: Woven Gauze Sponge, Non-Sterile 4x4 in Every Other Day/30 Days Discharge Instructions: Apply over primary dressing as directed. Secured With: [employer], 4.5x3.1 (in/yd) (Generic) Every Other Day/30 Days Discharge Instructions: Secure with Kerlix as directed. Secured With: 45M Medipore H Soft Cloth Surgical T ape, 4 x 10 (in/yd) (Generic) Every Other Day/30 Days Discharge Instructions: Secure with tape as directed. Com pression Stockings: Circaid Juxta Lite Compression Wrap Compression Amount: 30-40 mmHg (right) Discharge Instructions: Apply Circaid Juxta Lite Compression Wrap daily as instructed. Apply first thing in the morning, remove at night before bed. WOUND #56: - Foot Wound Laterality: Dorsal, Left Cleanser: Soap and Water Every Other Day/30 Days Discharge Instructions: May shower and wash wound with dial antibacterial soap and water prior to dressing change. Peri-Wound Care: Triamcinolone 15 (g) Every Other Day/30 Days Discharge Instructions: Use triamcinolone 15 (g) as directed Peri-Wound Care: Sween Lotion (Moisturizing lotion) Every Other Day/30 Days Discharge Instructions: Apply Aquaphor moisturizing lotion as directed Peri-Wound Care: Lotrisone Every Other Day/30 Days Discharge Instructions: or antifungal spray, Apply Lotrisone to periwound Prim Dressing: KerraCel Ag Gelling  Fiber Dressing, 2x2 in (silver alginate) Every Other Day/30 Days ary Discharge Instructions: Apply silver alginate to wound bed as instructed Secondary Dressing: Woven Gauze Sponge, Non-Sterile 4x4 in Every Other Day/30 Days Discharge Instructions: Apply over primary dressing as directed. Secured With: [employer], 4.5x3.1 (in/yd) (Generic) Every Other Day/30 Days Discharge Instructions: Secure with Kerlix as directed. Secured With: 45M Medipore H Soft Cloth Surgical T ape, 4 x 10 (in/yd) (Generic) Every Other Day/30 Days Discharge Instructions: Secure with tape as directed. WOUND #65: - Upper Leg Wound Laterality: Left Cleanser: Soap and Water Every Other Day/30 Days Discharge Instructions: May shower and wash wound with dial antibacterial soap and water prior to dressing change. Peri-Wound Care: Zinc Oxide Ointment 30g tube Every Other Day/30 Days Discharge Instructions: Apply Zinc Oxide to periwound with each dressing change Topical: Keystone antibiotic compound Every Other Day/30 Days Discharge Instructions: thin layer to wound bed, use daily once available Prim Dressing: KerraCel Ag Gelling Fiber Dressing, 4x5 in (silver alginate) (Generic) Every Other Day/30 Days ary Discharge Instructions: Apply silver alginate to wound bed as instructed Secondary Dressing: ALLEVYN Gentle Border, 4x4 (in/in) Every Other Day/30 Days Discharge Instructions: Apply over primary dressing as directed. WOUND #66: - Ankle Wound Laterality: Right, Anterior Cleanser: Soap and Water Every Other Day/30 Days Discharge Instructions: May shower and wash wound with dial antibacterial soap and water prior to dressing change. Peri-Wound  Care: Triamcinolone 15 (g) Every Other Day/30 Days Discharge Instructions: Use triamcinolone 15 (g) as directed Peri-Wound Care: Sween Lotion (Moisturizing lotion) Every Other Day/30 Days Discharge Instructions: Apply Aquaphor moisturizing lotion as directed Peri-Wound Care:  Lotrisone Every Other Day/30 Days Discharge Instructions: Apply Lotrisone to periwound Prim Dressing: KerraCel Ag Gelling Fiber Dressing, 2x2 in (silver alginate) Every Other Day/30 Days ary Discharge Instructions: Apply silver alginate to wound bed as instructed Secondary Dressing: Woven Gauze Sponge, Non-Sterile 4x4 in Every Other Day/30 Days Discharge Instructions: Apply over primary dressing as directed. Secured With: [employer], 4.5x3.1 (in/yd) (Generic) Every Other Day/30 Days Discharge Instructions: Secure with Kerlix as directed. Secured With: 98M Medipore H Soft Cloth Surgical T ape, 4 x 10 (in/yd) (Generic) Every Other Day/30 Days Discharge Instructions: Secure with tape as directed. WOUND #67: - Lower Leg Wound Laterality: Left, Lateral Cleanser: Soap and Water Every Other Day/30 Days Discharge Instructions: May shower and wash wound with dial antibacterial soap and water prior to dressing change. Peri-Wound Care: Triamcinolone 15 (g) Every Other Day/30 Days Discharge Instructions: Use triamcinolone 15 (g) as directed Peri-Wound Care: Sween Lotion (Moisturizing lotion) Every Other Day/30 Days Discharge Instructions: Apply Aquaphor moisturizing lotion as directed Peri-Wound Care: Lotrisone Every Other Day/30 Days Discharge Instructions: Apply Lotrisone to periwound Prim Dressing: KerraCel Ag Gelling Fiber Dressing, 2x2 in (silver alginate) Every Other Day/30 Days ary Discharge Instructions: Apply silver alginate to wound bed as instructed Secondary Dressing: Woven Gauze Sponge, Non-Sterile 4x4 in Every Other Day/30 Days Discharge Instructions: Apply over primary dressing as directed. Secured With: [employer], 4.5x3.1 (in/yd) (Generic) Every Other Day/30 Days Discharge Instructions: Secure with Kerlix as directed. Secured With: 98M Medipore H Soft Cloth Surgical T ape, 4 x 10 (in/yd) (Generic) Every Other Day/30 Days Discharge Instructions: Secure with tape as  directed. 08/10/2022: There is again hypertrophic granulation tissue on the right anterior ankle wound. Both feet are about the same. The left lateral ankle wound is [name] little bit desiccated and has some slough buildup. He unfortunately suffered [name] new injury when he was removing his pants and they caught his bandage which caused [name] large skin tear on his left ischium, just distal to the existing wound. There is hanging tissue present and the wound is oozing. The existing left ischial wound, however, is significantly better with just [name] little light slough on the surface. I used silver nitrate to chemically cauterize the hypertrophic granulation tissue on his right anterior ankle. I used [name] curette to debride eschar and slough from his dorsal foot wounds. I debrided slough from his left lateral ankle wound. I used scissors and forceps to trim the hanging tissue from his new left gluteal wound. This was quite oozy and required both silver nitrate and quick clot application for hemostasis. I debrided slough off of his previous ischial wound. We will continue to use the [facility] topical compounded antibiotic on his ischium. Continue silver alginate to all of the other wounds except for the lateral ankle wound; due to its somewhat dry nature, I am going to change that to Prisma silver collagen to see if we can improve the moisture balance in the wound. Continue Lotrisone to the dorsal foot wounds. He will follow-up in 2 weeks. Electronic Signature(s) Signed: 08/10/2022 12:41:51 PM By: Fredirick Maudlin MD FACS Signed: 08/11/2022 5:15:08 PM By: Blanche East RN Previous Signature: 08/10/2022 9:21:06 AM Version By: Fredirick Maudlin MD FACS Entered By: Blanche East on 08/10/2022 11:11:50 -------------------------------------------------------------------------------- HxROS Details Patient Name: Date of  Service: Reiger, [name] 08/10/2022 8:15 [name] Medical Record Number: [id number] Patient Account Number:  [id number] Date of Birth/Sex: Treating RN: 1986-05-02 (36 y.o. M) Primary Care Provider: [place], [place] Other Clinician: Referring Provider: Treating Provider/Extender: Cornelia Copa [name]: 344 Information Obtained From Patient Ear/Nose/Mouth/Throat Medical History: Negative for: Chronic sinus problems/congestion; Middle ear problems Hematologic/Lymphatic Medical History: Negative for: Anemia; Hemophilia; Human Immunodeficiency Virus; Lymphedema; Sickle Cell Disease Respiratory Medical History: Positive for: Sleep Apnea - does not tolerate cpap Negative for: Aspiration; Asthma; Chronic Obstructive Pulmonary Disease (COPD); Pneumothorax; Tuberculosis Cardiovascular Medical History: Positive for: Hypertension - lisinopril HCTZ Negative for: Angina; Arrhythmia; Congestive Heart Failure; Coronary Artery Disease; Deep Vein Thrombosis; Hypotension; Myocardial Infarction; Peripheral Arterial Disease; Peripheral Venous Disease; Phlebitis; Vasculitis Gastrointestinal Medical History: Negative for: Cirrhosis ; Colitis; Crohns; Hepatitis [name]; Hepatitis B; Hepatitis C Endocrine Medical History: Negative for: Type I Diabetes; Type II Diabetes Genitourinary Medical History: Negative for: End Stage Renal Disease Immunological Medical History: Negative for: Lupus Erythematosus; Raynauds; Scleroderma Integumentary (Skin) Medical History: Negative for: History of Burn Musculoskeletal Medical History: Negative for: Gout; Rheumatoid Arthritis; Osteoarthritis; Osteomyelitis Neurologic Medical History: Positive for: Paraplegia Negative for: Dementia; Neuropathy; Quadriplegia; Seizure Disorder Oncologic Medical History: Negative for: Received Chemotherapy; Received Radiation Psychiatric Medical History: Negative for: Anorexia/bulimia; Confinement Anxiety Immunizations Pneumococcal Vaccine: Received Pneumococcal Vaccination: No Immunization Notes: doesn't  remember when last tetanus shot was Implantable Devices No devices added Hospitalization / Surgery History Type of Hospitalization/Surgery cellulitis in leg left leg vein ablation Family and Social History Cancer: No; Diabetes: Yes - Father; Heart Disease: No; Hereditary Spherocytosis: No; Hypertension: Yes - Mother; Kidney Disease: No; Lung Disease: No; Stroke: No; Thyroid Problems: No; Tuberculosis: No; Former smoker; Marital Status - Married; Alcohol Use: Moderate; Drug Use: No History; Caffeine Use: Daily; Financial Concerns: No; Food, Clothing or Shelter Needs: No; Support System Lacking: No; Transportation Concerns: No Electronic Signature(s) Signed: 08/10/2022 9:23:55 AM By: Fredirick Maudlin MD FACS Entered By: Fredirick Maudlin on 08/10/2022 09:17:41 -------------------------------------------------------------------------------- SuperBill Details Patient Name: Date of Service: Rials, [name] 08/10/2022 Medical Record Number: [id number] Patient Account Number: [id number] Date of Birth/Sex: Treating RN: March 07, 1986 (36 y.o. 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, 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 Non-pressure chronic ulcer of other part of left foot limited to breakdown 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 Facility Procedures CPT4 Code: 76734193 Description: 79024 -  DEBRIDE WOUND 1ST 20 SQ CM OR < ICD-10 Diagnosis Description L97.322 Non-pressure chronic ulcer of left 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 L97.521 Non-pressure chronic ulcer of other part of left foot limited to breakdown of sk L89.323  Pressure ulcer of left buttock, stage 3 L97.511 Non-pressure chronic ulcer of other part of right foot limited to breakdown of s Modifier: in kin Quantity: 1 CPT4 Code: [id number] Description: 92426 - DEBRIDE WOUND EA ADDL 20 SQ CM ICD-10 Diagnosis Description 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, stage 3 L97.521 Non-pressure chronic ulcer of other part of left foot limited to breakdown  of sk L97.511 Non-pressure chronic ulcer  of other part of right foot limited to breakdown of s L97.322 Non-pressure chronic ulcer of left 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 Modifier: in kin Quantity: 1 CPT4 Code: [id number] Description: 08811 - CHEM CAUT GRANULATION TISS ICD-10 Diagnosis Description L97.312 Non-pressure chronic ulcer of right 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 Modifier: Quantity: 1 Physician Procedures : CPT4 Code Description Modifier [id number] - WC PHYS LEVEL 4 - EST PT 25 ICD-10 Diagnosis Description L97.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, stage 3 L97.322  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 Quantity: 1 : [id number] - WC PHYS DEBR WO ANESTH 20 SQ CM ICD-10 Diagnosis Description L97.322 Non-pressure chronic ulcer of left 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 L97.521 Non-pressure chronic ulcer of other part of left foot limited to breakdown of skin L89.323  Pressure ulcer of left buttock, 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 Quantity: 1 : [id number] - WC PHYS DEBR WO ANESTH EA ADD 20 CM ICD-10 Diagnosis Description L89.323 Pressure  ulcer of left buttock, stage 3 L97.521 Non-pressure chronic ulcer of other part of left foot limited to breakdown of skin L97.511 Non-pressure chronic  ulcer of other part of right foot limited to breakdown of skin L97.322 Non-pressure chronic ulcer of left 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 Quantity: 1 Electronic Signature(s) Signed: 08/10/2022 9:22:48 AM By: Fredirick Maudlin MD FACS Entered By: Fredirick Maudlin on 08/10/2022 09:22:44

## 2022-08-31 ENCOUNTER — Encounter (HOSPITAL_BASED_OUTPATIENT_CLINIC_OR_DEPARTMENT_OTHER): Payer: BC Managed Care – PPO | Attending: General Surgery | Admitting: General Surgery

## 2022-08-31 DIAGNOSIS — G8220 Paraplegia, unspecified: Secondary | ICD-10-CM | POA: Insufficient documentation

## 2022-08-31 DIAGNOSIS — I10 Essential (primary) hypertension: Secondary | ICD-10-CM | POA: Diagnosis not present

## 2022-08-31 DIAGNOSIS — G4730 Sleep apnea, unspecified: Secondary | ICD-10-CM | POA: Diagnosis not present

## 2022-08-31 DIAGNOSIS — L89329 Pressure ulcer of left buttock, unspecified stage: Secondary | ICD-10-CM | POA: Diagnosis not present

## 2022-09-02 ENCOUNTER — Ambulatory Visit: Payer: Self-pay | Admitting: Licensed Clinical Social Worker

## 2022-09-02 NOTE — Patient Outreach (Signed)
  Care Coordination   09/02/2022 Name: [name] MRN: [id number] DOB: 08-May-1986   Care Coordination Outreach Attempts:  An unsuccessful telephone outreach was attempted today to offer the patient information about available care coordination services as a benefit of their health plan.   Follow Up Plan:  Additional outreach attempts will be made to offer the patient care coordination information and services.   Encounter Outcome:  No Answer  Care Coordination Interventions Activated:  No   Care Coordination Interventions:  No, not indicated    Lenor Derrick , MSW Social Worker [facility] Care Management  [phone]

## 2022-09-03 NOTE — Progress Notes (Signed)
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 erythema around his wound and on the left dorsal foot has resolved he is off antibiotics 03/10/17; better-looking surface today. I don't think he can tolerate Apligraf's. He tells me he had [name] wound VAC after [name] skin graft years ago to this area and they had difficulty with [name] seal. The erythema continues to be stable around this some degree of chronic venous inflammation but he also has recurrent cellulitis. We have been using Iodoflex 03/17/17; continued improvement in the surface and may be small changes in dimensions. Using Iodoflex which seems the only thing that will control his surface 03/24/17- He is here for follow up evaluation of his LLE lateral ulceration and ulcer to right dorsal foot/toe space. He is voicing no complaints or concerns, He is tolerating compression wrap. 03/31/17 arrives today with [name] much healthier looking wound on the left lower extremity. We have been using Iodoflex for [name] prolonged period of time which has for the first time prepared and adequate looking wound bed although we have not had much in the way of wound dimension improvement. He also has [name] small wound between the first and second toe on the right 04/07/17; arrives today with [name] healthy-looking wound bed and at least the top 50% of this wound appears to be now her. No debridement was required I have changed him to [facility] last week after prolonged Iodoflex. He did not do well with Apligraf's. We've had [name] re-opening between the first and second toe on the right 04/14/17;  arrives today with [name] healthier looking wound bed contractions and the top 50% of this wound and some on the lesser 50%. Wound bed appears healthy. The area between the first and second toe on the right still remains problematic 04/21/17; continued very gradual improvement. Using [facility] 04/28/17; continued very gradual improvement in the left lateral leg venous insufficiency wound. His periwound erythema is very mild. We have been using Hydrofera Blue. Wound is making progress especially in the superior 50% 05/05/17; he continues to have very gradual improvement in the left lateral venous insufficiency wound. Both in terms with an length rings are improving. I debrided this every 2 weeks with #5 curet and we have been using Hydrofera Blue and again making  good progress With regards to the wounds between his right first and second toe which I thought might of been tinea pedis he is not making as much progress very dry scaly skin over the area. Also the area at the base of the left first and second toe in [name] similar condition 05/12/17; continued gradual improvement in the refractory left lateral venous insufficiency wound on the left. Dimension smaller. Surface still requiring debridement using Hydrofera Blue 05/19/17; continued gradual improvement in the refractory left lateral venous ulceration. Careful inspection of the wound bed underlying rumination suggested some degree of epithelialization over the surface no debridement indicated. Continue Hydrofera Blue difficult areas between his toes first and third on the left than first and second on the right. I'm going to change to silver alginate from silver collagen. Continue ketoconazole as I suspect underlying tinea pedis 05/26/17; left lateral leg venous insufficiency wound. We've been using Hydrofera Blue. I believe that there is expanding epithelialization over the surface of the wound albeit not coming from the wound circumference. This is [name] bit of  an odd situation in which the epithelialization seems to be coming from the surface of the wound rather than in the exact circumference. There is still small open areas mostly along the lateral margin of the wound. He has unchanged areas between the left first and second and the right first second toes which I been treating for tenia pedis 06/02/17; left lateral leg venous insufficiency wound. We have been using Hydrofera Blue. Somewhat smaller from the wound circumference. The surface of the wound remains [name] bit on it almost epithelialized sedation in appearance. I use an open curette today debridement in the surface of all of this especially the edges Small open wounds remaining on the dorsal right first and second toe interspace and the plantar left first second toe and her face on the left 06/09/17; wound on the left lateral leg continues to be smaller but very gradual and very dry surface using Hydrofera Blue 06/16/17 requires weekly debridements now on the left lateral leg although this continues to contract. I changed to silver collagen last week because of dryness of the wound bed. Using Iodoflex to the areas on his first and second toes/web space bilaterally 06/24/17; patient with history of paraplegia also chronic venous insufficiency with lymphedema. Has [name] very difficult wound on the left lateral leg. This has been gradually reducing in terms of with but comes in with [name] very dry adherent surface. High switch to silver collagen [name] week or so ago with hydrogel to keep the area moist. This is been refractory to multiple dressing attempts. He also has areas in his first and second toes bilaterally in the anterior and posterior web space. I had been using Iodoflex here after [name] prolonged course of silver alginate with ketoconazole was ineffective [question tinea pedis] 07/14/17; patient arrives today with [name] very difficult adherent material over his left lateral lower leg wound. He also has surrounding  erythema and poorly controlled edema. He was switched his Santyl last visit which the nurses are applying once during his doctor visit and once on [name] nurse visit. He was also reduced to 2 layer compression I'm not exactly sure of the issue here. 07/21/17; better surface today after 1 week of Iodoflex. Significant cellulitis that we treated last week also better. [Doxycycline] 07/28/17 better surface today with now 2 weeks of Iodoflex. Significant cellulitis treated with doxycycline. He has now completed the doxycycline and he is back to his usual  degree of chronic venous inflammation/stasis dermatitis. He reminds me he has had ablations surgery here 08/04/17; continued improvement with Iodoflex to the left lateral leg wound in terms of the surface of the wound although the dimensions are better. 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 Problematic areas on the plantar aspect of the first second toe web space on the left and the dorsal aspect of the first second toe web space on the right. At one point I felt these were probably related to chronic fungal infections in treated him aggressively for this although we have not made any improvement here. 08/11/17; left lateral leg. Surface continues to improve with the Iodoflex although we are not seeing much improvement in overall wound dimensions. Areas on his plantar left foot and right foot show no improvement. In fact the right foot looks somewhat worse 08/18/17; left lateral leg. We changed to [facility] Blue last week after [name] prolonged course of Iodoflex which helps get the surface better. It appears that the wound with is improved. Continue with difficult areas on the left dorsal first second and plantar first second on the right 09/01/17; patient arrives in clinic today having had [name] temperature of 103 yesterday. He was seen in the ER and [facility]. The patient was concerned he  could have cellulitis again in the right leg however they diagnosed him with [name] UTI and he is now on Keflex. He has [name] history of cellulitis which is been recurrent and [name], [name] ([id number]) [id number] Page 6 of 37 difficult but this is been in the left leg, in the past 5 use doxycycline. He does in and out catheterizations at home which are risk factors for UTI 09/08/17; patient will be completing his Keflex this weekend. The erythema on the left leg is considerably better. He has [name] new wound today on the medial part of the right leg small superficial almost looks like [name] skin tear. He has worsening of the area on the right dorsal first and second toe. His major area on the left lateral leg is better. Using Hydrofera Blue on all areas 09/15/17; gradual reduction in width on the long wound in the left lateral leg. No debridement required. He also has wounds on the plantar aspect of his left first second toe web space and on the dorsal aspect of the right first second toe web space. 09/22/17; there continues to be very gradual improvements in the dimensions of the left lateral leg wound. He hasn't round erythematous spot with might be pressure on his wheelchair. There is no evidence obviously of infection no purulence no warmth He has [name] dry scaled area on the plantar aspect of the left first second toe Improved area on the dorsal right first second toe. 09/29/17; left lateral leg wound continues to improve in dimensions mostly with an is still [name] fairly long but increasingly narrow wound. He has [name] dry scaled area on the plantar aspect of his left first second toe web space Increasingly concerning area on the dorsal right first second toe. In fact I am concerned today about possible cellulitis around this wound. The areas extending up his second toe and although there is deformities here almost appears to abut on the nailbed. 10/06/17; left lateral leg wound continues to  make very gradual progress. Tissue culture I did from the right first second toe dorsal foot last time grew MRSA and enterococcus which was vancomycin sensitive. This was not  sensitive to clindamycin or doxycycline. He is allergic to Zyvox and sulfa we have therefore arrange for him to have dalvance infusion tomorrow. He is had this in the past and tolerated it well 10/20/17; left lateral leg wound continues to make decent progress. This is certainly reduced in terms of with there is advancing epithelialization.The cellulitis in the right foot looks better although he still has [name] deep wound in the dorsal aspect of the first second toe web space. Plantar left first toe web space on the left I think is making some progress 10/27/17; left lateral leg wound continues to make decent progress. Advancing epithelialization.using Hydrofera Blue The right first second toe web space wound is better-looking using silver alginate Improvement in the left plantar first second toe web space. Again using silver alginate 11/03/17 left lateral leg wound continues to make decent progress albeit slowly. Using [facility] The right per second toe web space continues to be [name] very problematic looking punched out wound. I obtained [name] piece of tissue for deep culture I did extensively treated this for fungus. It is difficult to imagine that this is [name] pressure area as the patient states other than going outside he doesn't really wear shoes at home The left plantar first second toe web space looked fairly senescent. Necrotic edges. This required debridement change to [facility] Blue to all wound areas 11/10/17; left lateral leg wound continues to contract. Using Hydrofera Blue On the right dorsal first second toe web space dorsally. Culture I did of this area last week grew MRSA there is not an easy oral option in this patient was multiple antibiotic allergies or intolerances. This was only [name] rare culture isolate I'm therefore  going to use Bactroban under silver alginate On the left plantar first second toe web space. Debridement is required here. This is also unchanged 11/17/17; left lateral leg wound continues to contract using Hydrofera Blue this is no longer the major issue. The major concern here is the right first second toe web space. He now has an open area going from dorsally to the plantar aspect. There is now wound on the inner lateral part of the first toe. Not [name] very viable surface on this. There is erythema spreading medially into the forefoot. No major change in the left first second toe plantar wound 11/24/17; left lateral leg wound continues to contract using Hydrofera Blue. Nice improvement today The right first second toe web space all of this looks [name] lot less angry than last week. I have given him clindamycin and topical Bactroban for MRSA and terbinafine for the possibility of underlining tinea pedis that I could not control with ketoconazole. Looks somewhat better The area on the plantar left first second toe web space is weeping with dried debris around the wound 12/01/17; left lateral leg wound continues to contract he Hydrofera Blue. It is becoming thinner in terms of with nevertheless it is making good improvement. The right first second toe web space looks less angry but still [name] large necrotic-looking wounds starting on the plantar aspect of the right foot extending between the toes and now extensively on the base of the right second toe. I gave him clindamycin and topical Bactroban for MRSA anterior benefiting for the possibility of underlying tinea pedis. Not looking better today The area on the left first/second toe looks better. Debrided of necrotic debris 12/05/17* the patient was worked in urgently today because over the weekend he found blood on his incontinence bad when he woke  up. He was found to have an ulcer by his wife who does most of his wound care. He came in today for [place] to look at  this. He has not had [name] history of wounds in his buttocks in spite of his paraplegia. 12/08/17; seen in follow-up today at his usual appointment. He was seen earlier this week and found to have [name] new wound on his buttock. We also follow him for wounds on the left lateral leg, left first second toe web space and right first second toe web space 12/15/17; we have been using Hydrofera Blue to the left lateral leg which has improved. The right first second toe web space has also improved. Left first second toe web space plantar aspect looks stable. The left buttock has worsened using Santyl. Apparently the buttock has drainage 12/22/17; we have been using Hydrofera Blue to the left lateral leg which continues to improve now 2 small wounds separated by normal skin. He tells [place] he had [name] fever up to 100 yesterday he is prone to UTIs but has not noted anything different. He does in and out catheterizations. The area between the first and second toes today does not look good necrotic surface covered with what looks to be purulent drainage and erythema extending into the third toe. I had gotten this to something that I thought look better last time however it is not look good today. He also has [name] necrotic surface over the buttock wound which is expanded. I thought there might be infection under here so I removed [name] lot of the surface with [name] #5 curet though nothing look like it really needed culturing. He is been using Santyl to this area 12/27/17; his original wound on the left lateral leg continues to improve using Hydrofera Blue. I gave him samples of Baxdella although he was unable to take them out of fear for an allergic reaction ["lump in his throat"].the culture I did of the purulent drainage from his second toe last week showed both enterococcus and [name] set Enterobacter I was also concerned about the erythema on the bottom of his foot although paradoxically although this looks somewhat better today. 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 We've been using silver alginate here. They came up today that he sleeps in [name] recliner, I'm not sure why but I asked him to stop this 01/03/18; his original wound we've been using Hydrofera Blue is now separated into 2 areas. Ulcer on his left buttock is better he is off the recliner and sleeping in bed Finally both wound areas between his first and second toes also looks some better 01/10/18; his original wound on the left lateral leg is now separated into 2 wounds we've been using Hydrofera Blue Ulcer on his left buttock has some drainage. There is [name] small probing site going into muscle layer superiorly.using silver alginate -He arrives today with [name] deep tissue injury on the left heel The wound on the dorsal aspect of his first second toe on the left looks [name] lot betterusing silver alginate ketoconazole The area on the first second toe web space on the right also looks [name] lot bette 01/17/18; his original wound on the left lateral leg continues to progress using Hydrofera Blue Ulcer on his left buttock also is smaller surface healthier except for [name] small probing site going into the muscle layer superiorly. 2.4 cm of tunneling in  this area DTI on his left heel we have only been offloading. Looks better than last week no threatened open no evidence of infection the wound on the dorsal aspect of the first second toe on the left continues to look like it's regressing we have only been using silver alginate and terbinafine orally The area in the first second toe web space on the right also looks to be [name] lot better using silver alginate and terbinafine I think this was prompted by tinea pedis 01/31/18; the patient was hospitalized in [place] last week apparently for [name] complicated UTI. He was discharged on cefepime he does in and out catheterizations. In the hospital he was discovered  M I don't mild elevation of AST and ALT and the terbinafine was stopped.predictably the pressure ulcer on s his buttock looks betterusing silver alginate. The area on the left lateral leg also is better using Hydrofera Blue. The area between the first and second toes on the left better. First and second toes on the right still substantial but better. Finally the DTI on the left heel has held together and looks like it's resolving 02/07/18-he is here in follow-up evaluation for multiple ulcerations. He has new injury to the lateral aspect of the last issue [name] pressure ulcer, he states this is [place], [place] ([id number]) [id number] Page 7 of 37 from adhesive removal trauma. He states he has tried multiple adhesive products with no success. All other ulcers appear stable. The left heel DTI is resolving. We will continue with same treatment plan and follow-up next week. 02/14/18; follow-up for multiple areas. He has [name] new area last week on the lateral aspect of his pressure ulcer more over the posterior trochanter. The original pressure ulcer looks quite stable has healthy granulation. 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 Almost fully epithelialized on the original superior area using Hydrofera Blue DTI on the left heel has peeled off this week to reveal [name] small superficial wound under denuded skin and subcutaneous tissue Both areas between the first and second toes look better including nothing open on the left 02/21/18; The patient's wounds on his left ischial tuberosity and posterior left greater trochanter actually looked better. He has [name] large area of irritation around the area which I think is contact dermatitis. I am doubtful that this is fungal His original wound on the left lateral calf continues to improve we have been using Hydrofera Blue There is no open area  in the left first second toe web space although there is [name] lot of thick callus The DTI on the left heel required debridement today of necrotic surface eschar and subcutaneous tissue using silver alginate Finally the area on the right first second toe webspace continues to contract using silver alginate and ketoconazole 02/28/18 Left ischial tuberosity wounds look better using silver alginate. Original wound on the left calf only has one small open area left using Hydrofera Blue DTI on the left heel required debridement mostly removing skin from around this wound surface. Using silver alginate The areas on the right first/second toe web space using silver alginate and ketoconazole 03/08/18 on evaluation today patient appears to be doing decently well as best I can tell in regard to his wounds. This is the first time that I have seen him as he generally is followed by Dr. Dellia Nims. With that being said none of his wounds appear to be infected  he does have an area where there is some skin covering what appears to be [name] new wound on the left dorsal surface of his great toe. This is right at the nail bed. With that being said I do believe that debrided away some of the excess skin can be of benefit in this regard. Otherwise he has been tolerating the dressing changes without complication. 03/14/18; patient arrives today with the multiplicity of wounds that we are following. He has not been systemically unwell Original wound on the left lateral calf now only has 2 small open areas we've been using Hydrofera Blue which should continue The deep tissue injury on the left heel requires debridement today. We've been using silver alginate The left first second toe and the right first second toe are both are reminiscence what I think was tinea pedis. Apparently some of the callus Surface between the toes was removed last week when it started draining. Purulent drainage coming from the wound on the ischial tuberosity on  the left. 03/21/18-He is here in follow-up evaluation for multiple wounds. 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 He tolerated debridement. The only change to last week's recommendations is to discontinue antifungal cream between toes. He will follow-up next week 03/28/18; following up for multiple wounds;Concern this week is streaking redness and swelling in the right foot. He is going to need antibiotics for this. 03/31/18; follow-up for right foot cellulitis. Streaking redness and swelling in the right foot on 03/28/18. 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 I put him on clindamycin 300 mg every 6 and brought him in for [name] quick check. He has an open wound between his first and second toes on the right foot as [name] potential source. 04/04/18; Right foot cellulitis is resolving he is completing clindamycin. This is truly good news Left lateral calf wound which is initial wound only has one small open area inferiorly this is close to healing out. He has compression stockings. We will use Hydrofera Blue right down to the epithelialization of this Nonviable surface on the left heel which was initially pressure with [name] DTI. We've been using Hydrofera Blue. I'm going to switch this back to silver alginate Left first second toe/tinea pedis this looks better using silver alginate Right first second toe tinea pedis using silver alginate Large pressure ulcers on theLeft ischial tuberosity. Small wound here Looks better. I am uncertain about the surface over the large wound. Using silver alginate 04/11/18; Cellulitis in the right foot is resolved Left lateral calf wound which was his original wounds still has 2 tiny open areas remaining this is just about closed Nonviable surface on the left heel is better but still requires debridement Left first second toe/tinea pedis still open using silver alginate Right  first second toe wound tinea pedis I asked him to go back to using ketoconazole and silver alginate Large pressure ulcers on the left ischial tuberosity this shear injury here is resolved. Wound is smaller. No evidence of infection using silver alginate 04/18/18; Patient arrives with an intense area of cellulitis in the right mid lower calf extending into the right heel area. Bright red and warm. Smaller area on the left anterior leg. He has [name] significant history of MRSA. He will definitely need antibioticsdoxycycline He now has 2 open areas on the left ischial tuberosity the original large wound and now [name] satellite area which I think was above his initial satellite  areas. Not [name] wonderful surface on this satellite area surrounding erythema which looks like pressure related. His left lateral calf wound again his original wound is just about closed Left heel pressure injury still requiring debridement Left first second toe looks [name] lot better using silver alginate Right first second toe also using silver alginate and ketoconazole cream also looks better 04/20/18; the patient was worked in early today out of concerns with his cellulitis on the right leg. I had started him on doxycycline. This was 2 days ago. His wife was concerned about the swelling in the area. Also concerned about the left buttock. He has not been systemically unwell no fever chills. No nausea vomiting or diarrhea 04/25/18; the patient's left buttock wound is continued to deteriorate he is using Hydrofera Blue. He is still completing clindamycin for the cellulitis on the right leg although all of this looks better. 05/02/18 Left buttock wound still with [name] lot of drainage and [name] very tightly adherent fibrinous necrotic surface. He has [name] deeper area superiorly The left lateral calf wound is still closed DTI wound on the left heel necrotic surface especially the circumference using Iodoflex Areas between his left first second toe and right  first second toe both look better. Dorsally and the right first second toe he had [name] necrotic surface although at smaller. In using silver alginate and ketoconazole. I did [name] culture last week which was [name] deep tissue culture of the reminiscence of the open wound on the right first second toe dorsally. This grew [name] few Acinetobacter and [name] few methicillin-resistant staph aureus. Nevertheless the area actually this week looked better. I didn't feel the need to specifically address this at least in terms of systemic antibiotics. 05/09/18; wounds are measuring larger more drainage per our intake. We are using Santyl covered with alginate on the large superficial buttock wounds, Iodosorb on the left heel, ketoconazole and silver alginate to the dorsal first and second toes bilaterally. 05/16/18; The area on his left buttock better in some aspects although the area superiorly over the ischial tuberosity required an extensive debridement.using Santyl Left heel appears stable. Using Iodoflex The areas between his first and second toes are not bad however there is spreading erythema up the dorsal aspect of his left foot this looks like cellulitis again. He is insensate the erythema is really very brilliant.o Erysipelas He went to see an allergist days ago because he was itching part of this he had lab work done. This showed [name] white count of 15.1 with 70% neutrophils. Hemoglobin of 11.4 and [name] platelet count of 659,000. Last white count we had in Epic was [name] 2-1/2 years ago which was 25.9 but he was ill at the time. He was able to show me some lab work that was done by his primary physician the pattern is about the same. 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 But prompted me to go ahead and do x-rays of both feet and the pelvis rule out osteomyelitis. He also had [name] comprehensive metabolic panel this was reasonably normal his albumin was 3.7 liver function tests  BUN/creatinine all normal [name], [name] ([id number]) [id number] Page 8 of 37 05/23/18; x-rays of both his feet from last week were negative for underlying pulmonary abnormality. The x-ray of his pelvis however showed mild irregularity in the left ischial which may represent some early osteomyelitis. The wound in the left ischial continues to get deeper clearly now exposed  muscle. Each week necrotic surface material over this area. Whereas the rest of the wounds do not look so bad. The left ischial wound we have been using Santyl and calcium alginate T the left heel surface necrotic debris using Iodoflex o The left lateral leg is still healed Areas on the left dorsal foot and the right dorsal foot are about the same. There is some inflammation on the left which might represent contact dermatitis, fungal dermatitis I am doubtful cellulitis although this looks better than last week 05/30/18; CT scan done at Hospital did not show any osteomyelitis or abscess. Suggested the possibility of underlying cellulitis although I don't see [name] lot of evidence of this at the bedside The wound itself on the left buttock/upper thigh actually looks somewhat better. No debridement Left heel also looks better no debridement continue Iodoflex Both dorsal first second toe spaces appear better using Lotrisone. Left still required debridement 06/06/18; Intake reported some purulent looking drainage from the left gluteal wound. Using Santyl and calcium alginate Left heel looks better although still [name] nonviable surface requiring debridement The left dorsal foot first/second webspace actually expanding and somewhat deeper. I may consider doing [name] shave biopsy of this area Right dorsal foot first/second webspace appears stable to improved. Using Lotrisone and silver alginate to both these areas 06/13/18 Left gluteal surface looks better. Now separated in the 2 wounds. No debridement required. Still  drainage. We'll continue silver alginate Left heel continues to look better with Iodoflex continue this for at least another week Of his dorsal foot wounds the area on the left still has some depth although it looks better than last week. We've been using Lotrisone and silver alginate 06/20/18 Left gluteal continues to look better healthy tissue Left heel continues to look better healthy granulation wound is smaller. He is using Iodoflex and his long as this continues continue the Iodoflex Dorsal right foot looks better unfortunately dorsal left foot does not. There is swelling and erythema of his forefoot. He had minor trauma to this several days ago but doesn't think this was enough to have caused any tissue injury. Foot looks like cellulitis, we have had this problem before 06/27/18 on evaluation today patient appears to be doing [name] little worse in regard to his foot ulcer. 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 Both of which would really be his only options for treating this infection. In the past he has been given and effusion of Orbactiv. This is done very well for him in the past again it's one time dosing IV antibiotic therapy. Subsequently I do believe this is something we're gonna need to see about doing at this point in time. Currently his other wounds seem to be doing somewhat better in my pinion I'm pretty happy in that regard. 07/03/18 on evaluation today patient's wounds actually appear to be doing fairly well. He has been tolerating the dressing changes without complication. All in all he seems to be showing signs of improvement. In regard to the antibiotics he has been dealing with infectious disease since I saw him last week as far as getting this scheduled. In the end [facility]'s [facility]  this coming Friday. In the meantime he has been continuing to perform the dressing changes in such as previous. There does not  appear to be any evidence of infection worsengin at this time. 07/10/18; Since I last saw this man 2 weeks ago things have actually improved. IV antibiotics of resulted in less forefoot erythema although there is still some present. He is not systemically unwell Left buttock wounds 2 now have no depth there is increased epithelialization Using silver alginate Left heel still requires debridement using Iodoflex Left dorsal foot still with [name] sizable wound about the size of [name] border but healthy granulation Right dorsal foot still with [name] slitlike area using silver alginate 07/18/18; the patient's cellulitis in the left foot is improved in fact I think it is on its way to resolving. Left buttock wounds 2 both look better although the larger one has hypertension granulation we've been using silver alginate Left heel has some thick circumferential redundant skin over the wound edge which will need to be removed today we've been using Iodoflex Left dorsal foot is still [name] sizable wound required debridement using silver alginate The right dorsal foot is just about closed only [name] small open area remains here 07/25/18; left foot cellulitis is resolved Left buttock wounds 2 both look better. Hyper-granulation on the major area Left heel as some debris over the surface but otherwise looks [name] healthier wound. Using silver collagen Right dorsal foot is just about closed 07/31/18; arrives with our intake nurse worried about purulent drainage from the buttock. We had hyper-granulation here last week His buttock wounds 2 continue to look better Left heel some debris over the surface but measuring smaller. Right dorsal foot unfortunately has openings between the toes Left foot superficial wound looks less aggravated. 08/07/18 Buttock wounds continue to look better although some of her granulation  and the larger medial wound. silver alginate Left heel continues to look [name] lot better.silver collagen Left foot superficial wound looks less stable. Requires debridement. He has [name] new wound superficial area on the foot on the lateral dorsal foot. Right foot looks better using silver alginate without Lotrisone 08/14/2018; patient was in the ER last week diagnosed with [name] UTI. He is now on Cefpodoxime and Macrodantin. Buttock wounds continued to be smaller. Using silver alginate Left heel continues to look better using silver collagen Left foot superficial wound looks as though it is improving Right dorsal foot area is just about healed. 08/21/2018; patient is completed his antibiotics for his UTI. He has 2 open areas on the buttocks. There is still not closed although the surface looks satisfactory. Using silver alginate Left heel continues to improve using silver collagen The bilateral dorsal foot areas which are at the base of his first and second toes/possible tinea pedis are actually stable on the left but worse on the right. The area on the left required debridement of necrotic surface. After debridement I obtained [name] specimen for PCR culture. The right dorsal foot which is been just about healed last week is now reopened 08/28/2018; culture done on the left dorsal foot showed coag negative staph both staph epidermidis and Lugdunensis. I think this is worthwhile initiating systemic treatment. I will use doxycycline given his long list of allergies. The area on the left heel slightly improved but still requiring debridement. The large wound on the buttock is just about closed whereas the smaller one is larger. Using silver alginate in this area 09/04/2018; patient is completing his doxycycline for the left foot although this continues to be [name] very difficult wound area with very adherent necrotic debris. We are using silver alginate to all his  wounds right foot left foot and the small wounds on his  buttock, silver collagen on the left heel. 09/11/2018; once again this patient has intense erythema and swelling of the left forefoot. Lesser degrees of erythema in the right foot. He has [name] long list of allergies and intolerances. I will reinstitute doxycycline. 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 Using silver alginate Left heel also looks better using silver collagen Unfortunately both the areas on his feet look worse. The area on the left first second webspace is now gone through to the plantar part of his foot. The area on the left foot anteriorly is irritated with erythema and swelling in the forefoot. 09/25/2018 His wound on the left plantar heel looks better. Using silver collagen The area on the left buttock 2 small remnant areas. One is closed one is still open. Using silver alginate [name], [name] ([id number]) [id number] Page 9 of 37 The areas between both his first and second toes look worse. This in spite of long-standing antifungal therapy with ketoconazole and silver alginate which should have antifungal activity He has small areas around his original wound on the left calf one is on the bottom of the original scar tissue and one superiorly both of these are small and superficial but again given wound history in this site this is worrisome 10/02/2018 Left plantar heel continues to gradually contract using silver collagen Left buttock wound is unchanged using silver alginate The areas on his dorsal feet between his first and second toes bilaterally look about the same. I prescribed clindamycin ointment to see if we can address chronic staph colonization and also the underlying possibility of erythrasma The left lateral lower extremity wound is actually on the lateral part of his ankle. Small open area here. We have been using silver alginate 10/09/2018; Left plantar heel continues to look healthy and  contract. No debridement is required Left buttock slightly smaller with [name] tape injury wound just below which was new this week Dorsal feet somewhat improved I have been using clindamycin Left lateral looks lower extremity the actual open area looks worse although [name] lot of this is epithelialized. I am going to change to silver collagen today He has [name] lot more swelling in the right leg although this is not pitting not red and not particularly warm there is [name] lot of spasm in the right leg usually indicative of people with paralysis of some underlying discomfort. We have reviewed his vascular status from 2017 he had [name] left greater saphenous vein ablation. I wonder about referring him back to vascular surgery if the area on the left leg continues to deteriorate. 10/16/2018 in today for follow-up and management of multiple lower extremity ulcers. His left Buttock wound is much lower smaller and almost closed completely. The wound to the left ankle has began to reopen with Epithelialization and some adherent slough. He has multiple new areas to the left foot and leg. The left dorsal foot without much improvement. Wound present between left great webspace and 2nd toe. Erythema and edema present right leg. Right LE ultrasound obtained on 10/10/18 was negative for DVT . 10/23/2018; Left buttock is closed over. Still dry macerated skin but there is no open wound. I suspect this is chronic pressure/moisture Left lateral calf is quite [name] bit worse than when I saw this last. There is clearly drainage here he has macerated skin into the left plantar heel. We will  change the primary dressing to alginate Left dorsal foot has some improvement in overall wound area. Still using clindamycin and silver alginate Right dorsal foot about the same as the left using clindamycin and silver alginate The erythema in the right leg has resolved. He is DVT rule out was negative Left heel pressure area required debridement although  the wound is smaller and the surface is health 10/26/2018 The patient came back in for his nurse check today predominantly because of the drainage coming out of the left lateral leg with [name] recent reopening of his original wound on the left lateral calf. He comes in today with [name] large amount of surrounding erythema around the wound extending from the calf into the ankle and even in the area on the dorsal foot. He is not systemically unwell. He is not febrile. Nevertheless this looks like cellulitis. We have been using silver alginate to the area. I changed him to [name] regular visit and I am going to prescribe him doxycycline. The rationale here is [name] long list of medication intolerances and [name] history of MRSA. I did not see anything that I thought would provide [name] valuable culture 10/30/2018 Follow-up from his appointment 4 days ago with really an extensive area of cellulitis in the left calf left lateral ankle and left dorsal foot. I put him on doxycycline. He has [name] long list of medication allergies which are true allergy reactions. 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 In any case he is [name] lot better today. The erythema especially in the anterior and lateral left calf is better. He still has left ankle erythema. He also is complaining about increasing edema in the right leg we have only been using Kerlix Coban and he has been doing the wraps at home. Finally he has [name] spotty rash on the medial part of his upper left calf which looks like folliculitis or perhaps wrap occlusion type injury. Small superficial macules not pustules 11/06/18 patient arrives today with again [name] considerable degree of erythema around the wound on the left lateral calf extending into the dorsal ankle and dorsal foot. This is [name] lot worse than when I saw this last week. He is on doxycycline really with not [name] lot of improvement. He has not been systemically  unwell Wounds on the; left heel actually looks improved. Original area on the left foot and proximity to the first and second toes looks about the same. He has superficial areas on the dorsal foot, anterior calf and then the reopening of his original wound on the left lateral calf which looks about the same The only area he has on the right is the dorsal webspace first and second which is smaller. He has [name] large area of dry erythematous skin on the left buttock small open area here. 11/13/2018; the patient arrives in much better condition. The erythema around the wound on the left lateral calf is [name] lot better. Not sure whether this was the clindamycin or the TCA and ketoconazole or just in the improvement in edema control [stasis dermatitis]. In any case this is [name] lot better. The area on the left heel is very small and just about resolved using silver collagen we have been using silver alginate to the areas on his dorsal feet 11/20/2018; his wounds include the left lateral calf, left heel, dorsal aspects of both feet just proximal to the first second webspace. He is stable to slightly  improved. I did not think any changes to his dressings were going to be necessary 11/27/2018 he has [name] reopening on the left buttock which is surrounded by what looks like tinea or perhaps some other form of dermatitis. The area on the left dorsal foot has some erythema around it I have marked this area but I am not sure whether this is cellulitis or not. Left heel is not closed. Left calf the reopening is really slightly longer and probably worse 1/13; in general things look better and smaller except for the left dorsal foot. Area on the left heel is just about closed, left buttock looks better only [name] small wound remains in the skin looks better [using Lotrisone] 1/20; the area on the left heel only has [name] few remaining open areas here. Left lateral calf about the same in terms of size, left dorsal foot slightly larger  right lateral foot still not closed. The area on the left buttock has no open wound and the surrounding skin looks [name] lot better 1/27; the area on the left heel is closed. Left lateral calf better but still requiring extensive debridements. The area on his left buttock is closed. He still has the open areas on the left dorsal foot which is slightly smaller in the right foot which is slightly expanded. We have been using Iodoflex on these areas as well 2/3; left heel is closed. Left lateral calf still requiring debridement using Iodoflex there is no open area on his left buttock however he has dry scaly skin over [name] large area of this. Not really responding well to the Lotrisone. Finally the areas on his dorsal feet at the level of the first second webspace are slightly smaller on the right and about the same on the left. Both of these vigorously debrided with Anasept and gauze 2/10; left heel remains closed he has dry erythematous skin over the left buttock but there is no open wound here. Left lateral leg has come in and with. Still requiring debridement we have been using Iodoflex here. Finally the area on the left dorsal foot and right dorsal foot are really about the same extremely dry callused fissured areas. He does not yet have [name] dermatology appointment 2/17; left heel remains closed. He has [name] new open area on the left buttock. The area on the left lateral calf is bigger longer and still covered in necrotic debris. No major change in his foot areas bilaterally. I am awaiting for [name] dermatologist to look on this. We have been using ketoconazole I do not know that this is been doing any good at all. 2/24; left heel remains closed. The left buttock wound that was new reopening last week looks better. The left lateral calf appears better also although still requires debridement. The major area on his foot is the left first second also requiring debridement. We have been putting Prisma on all wounds. I  do not believe that the ketoconazole has done too much good for his feet. He will use Lotrisone I am going to give him [name] 2-week course of terbinafine. We still do not have [name] dermatology appointment 3/2 left heel remains closed however there is skin over bone in this area I pointed this out to him today. The left buttock wound is epithelialized but still does not look completely stable. The area on the left leg required debridement were using silver collagen here. With regards to his feet we changed to Lotrisone last week and silver alginate. 3/9;  left heel remains closed. Left buttock remains closed. The area on the right foot is essentially closed. The left foot remains unchanged. Slightly smaller on the left lateral calf. Using silver collagen to both of these areas 3/16-Left heel remains closed. Area on right foot is closed. Left lateral calf above the lateral malleolus open wound requiring debridement with easy bleeding. Left dorsal wound proximal to first toe also debrided. Left ischial area open new. Patient has been using Prisma with wrapping every 3 days. Dermatology appointment is apparently tomorrow.Patient has completed his terbinafine 2-week course with some apparent improvement according to him, there is still flaking and dry skin in his foot on the left 3/23; area on the right foot is reopened. The area on the left anterior foot is about the same still [name] very necrotic adherent surface. He still has the area on the [place] [name] ([id number]) [id number] Page 10 of 37 left leg and reopening is on the left buttock. He apparently saw dermatology although I do not have [name] note. According to the patient who is usually fairly well informed they did not have any good ideas. Put him on oral terbinafine which she is been on before. 3/30; using silver collagen to all wounds. Apparently his dermatologist put him on doxycycline and rifampin presumably some culture grew  staph. I do not have this result. He remains on terbinafine although I have used terbinafine on him before 4/6; patient has had [name] fairly substantial reopening on the right foot between the first and second toes. He is finished his terbinafine and I believe is on doxycycline and rifampin still as prescribed by dermatology. We have been using silver collagen to all his wounds although the patient reports that he thinks silver alginate does better on the wounds on his buttock. 4/13; the area on his left lateral calf about the same size but it did not require debridement. Left dorsal foot just proximal to the webspace between the first and second toes is about the same. Still nonviable surface. I note some superficial bronze discoloration of the dorsal part of his foot Right dorsal foot just proximal to the first and second toes also looks about the same. I still think there may be the same discoloration I noted above on the left Left buttock wound looks about the same 4/20; left lateral calf appears to be gradually contracting using silver collagen. 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 The left dorsal foot wound is debrided of tightly adherent necrotic debris and really cleans up quite nicely. The right area is worse with expansion. I did not debride this it is now over the base of the second toe The area on his left buttock is smaller no debridement is required using silver collagen 5/4; left calf continues to make good progress. He arrives with erythema around the wounds on his dorsal foot which even extends to the plantar aspect. Very concerning for coexistent infection. He is finished the erythromycin I gave him for possible erythrasma this does not seem to have helped. The area on the left foot is about the same base of the dorsal toes Is area on the buttock looks improved on the left 5/11; left calf and left buttock continued to  make good progress. Left foot is about the same to slightly improved. Major problem is on the right foot. He has not had an x-ray. Deep tissue culture I did last week showed both Enterobacter and [name]. coli. I  did not change the doxycycline I put him on empirically although neither 1 of these were plated to doxycycline. He arrives today with the erythema looking worse on both the dorsal and plantar foot. Macerated skin on the bottom of the foot. he has not been systemically unwell 5/18-Patient returns at 1 week, left calf wound appears to be making some progress, left buttock wound appears slightly worse than last time, left foot wound looks slightly better, right foot redness is marginally better. X-ray of both feet show no air or evidence of osteomyelitis. Patient is finished his Omnicef and terbinafine. He continues to have macerated skin on the bottom of the left foot as well as right 5/26; left calf wound is better, left buttock wound appears to have multiple small superficial open areas with surrounding macerated skin. X-rays that I did last time showed no evidence of osteomyelitis in either foot. He is finished cefdinir and doxycycline. I do not think that he was on terbinafine. He continues to have [name] large superficial open area on the right foot anterior dorsal and slightly between the first and second toes. I did send him to dermatology 2 months ago or so wondering about whether they would do [name] fungal scraping. I do not believe they did but did do [name] culture. We have been using silver alginate to the toe areas, he has been using antifungals at home topically either ketoconazole or Lotrisone. We are using silver collagen on the left foot, silver alginate on the right, silver collagen on the left lateral leg and silver alginate on the left buttock 6/1; left buttock area is healed. We have the left dorsal foot, left lateral leg and right dorsal foot. We are using silver alginate to the areas on both  feet and silver collagen to the area on his left lateral calf 6/8; the left buttock apparently reopened late last week. He is not really sure how this happened. He is tolerating the terbinafine. Using silver alginate to all wounds 6/15; left buttock wound is larger than last week but still superficial. Came in the clinic today with [name] report of purulence from the left lateral leg I did not identify any infection Both areas on his dorsal feet appear to be better. He is tolerating the terbinafine. Using silver alginate to all wounds 6/22; left buttock is about the same this week, left calf quite [name] bit better. His left foot is about the same however he comes in with erythema and warmth in the right forefoot once again. Culture that I gave him in the beginning of May showed Enterobacter and [name]. coli. I gave him doxycycline and things seem to improve although neither 1 of these organisms was specifically plated. 6/29; left buttock is larger and dry this week. Left lateral calf looks to me to be improved. Left dorsal foot also somewhat improved right foot completely unchanged. The erythema on the right foot is still present. He is completing the Ceftin dinner that I gave him empirically [see discussion above.) 7/6 - All wounds look to be stable and perhaps improved, the left buttock wound is slightly smaller, per patient bleeds easily, completed ceftin, the right foot redness is less, he is on terbinafine 7/13; left buttock wound about the same perhaps slightly narrower. Area on the left lateral leg continues to narrow. Left dorsal foot slightly smaller right foot about the same. We are using silver alginate on the right foot and Hydrofera Blue to the areas on the left. Unna boot on  the left 2 layer compression on the right 7/20; left buttock wound absolutely the same. Area on lateral leg continues to get better. Left dorsal foot require debridement as did the right no major change in the 7/27; left  buttock wound the same size necrotic debris over the surface. The area on the lateral leg is closed once again. His left foot looks better right foot about the same although there is some involvement now of the posterior first second toe area. He is still on terbinafine which I have given him for [name] month, not certain [name] centimeter major change 06/25/19-All wounds appear to be slightly improved according to report, left buttock wound looks clean, both foot wounds have minimal to no debris the right dorsal foot has minimal slough. We are using Hydrofera Blue to the left and silver alginate to the right foot and ischial wound. 8/10-Wounds all appear to be around the same, the right forefoot distal part has some redness which was not there before, however the wound looks clean and small. Ischial wound looks about the same with no changes 8/17; his wound on the left lateral calf which was his original chronic venous insufficiency wound remains closed. Since I last saw him the areas on the left dorsal foot right dorsal foot generally appear better but require debridement. The area on his left initial tuberosity appears somewhat larger to me perhaps hyper granulated and bleeds very easily. We have been using Hydrofera Blue to the left dorsal foot and silver alginate to everything else 8/24; left lateral calf remains closed. The areas on his dorsal feet on the webspace of the first and second toes bilaterally both look better. The area on the left buttock which is the pressure ulcer stage II slightly smaller. I change the dressing to Hydrofera Blue to all areas 8/31; left lateral calf remains closed. The area on his dorsal feet bilaterally look better. Using Hydrofera Blue. Still requiring debridement on the left foot. No change in the left buttock pressure ulcers however 9/14; left lateral calf remains closed. Dorsal feet look quite [name] bit better than 2 weeks ago. Flaking dry skin also [name] lot better with the  ammonium lactate I gave him 2 weeks ago. The area on the left buttock is improved. He states that his Roho cushion developed [name] leak and he is getting [name] new one, in the interim he is offloading this vigorously 9/21; left calf remains closed. Left heel which was [name] possible DTI looks better this week. He had macerated tissue around the left dorsal foot right foot looks satisfactory and improved left buttock wound. I changed his dressings to his feet to silver alginate bilaterally. Continuing Hydrofera Blue on the left buttock. 9/28 left calf remains closed. Left heel did not develop anything [possible DTI] dry flaking skin on the left dorsal foot. Right foot looks satisfactory. Improved left buttock wound. We are using silver alginate on his feet Hydrofera Blue on the buttock. I have asked him to go back to the Lotrisone on his feet including the wounds and surrounding areas 10/5; left calf remains closed. The areas on the left and right feet about the same. [name] lot of this is epithelialized however debris over the remaining open areas. He is using Lotrisone and silver alginate. The area on the left buttock using Hydrofera Blue 10/26. Patient has been out for 3 weeks secondary to Covid concerns. He tested negative but I think his wife tested positive. He comes in today with the left  foot substantially worse, right foot about the same. Even more concerning he states that the area on his left buttock closed over but then reopened and is considerably deeper in one aspect than it was before [stage III wound] 11/2; left foot really about the same as last week. Quarter sized wound on the dorsal foot just proximal to the first second toes. Surrounding erythema with areas of denuded epithelium. This is not really much different looking. Did not look like cellulitis this time however. Right foot area about the same.. We have been using silver alginate alginate on his toes Left buttock still substantial irritated  skin around the wound which I think looks somewhat better. We have been using Hydrofera Blue here. 11/9; left foot larger than last week and [name] very necrotic surface. Right foot I think is about the same perhaps slightly smaller. Debris around the circumference [name], [name] ([id number]) [id number] Page 11 of 37 also addressed. Unfortunately on the left buttock there is been [name] decline. Satellite lesions below the major wound distally and now [name] an additional one posteriorly we have been using Hydrofera Blue but I think this is [name] pressure issue 11/16; left foot ulcer dorsally again [name] very adherent necrotic surface. Right foot is about the same. Not much change in the pressure ulcer on his left buttock. 11/30; left foot ulcer dorsally basically the same as when I saw him 2 weeks ago. Very adherent fibrinous debris on the wound surface. Patient reports [name] lot of drainage as well. 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 We have been using Iodoflex, patient changed back to alginate because of the drainage. Area on his right dorsal foot really looks benign with [name] healthier surface certainly [name] lot better than on the left. Left buttock wounds all improved using Hydrofera Blue 12/7; left dorsal foot again no improvement. Tightly adherent debris. PCR culture I did last week only showed likely skin contaminant. I have gone ahead and done [name] punch biopsy of this which is about the last thing in terms of investigations I can think to do. He has known venous insufficiency and venous hypertension and this could be the issue here. The area on the right foot is about the same left buttock slightly worse according to our intake nurse secondary to [facility] Blue sticking to the wound 12/14; biopsy of the left foot that I did last time showed changes that could be related to wound healing/chronic stasis dermatitis phenomenon no neoplasm. We have  been using silver alginate to both feet. I change the one on the left today to Sorbact and silver alginate to his other 2 wounds 12/28; the patient arrives with the following problems; Major issue is the dorsal left foot which continues to be [name] larger deeper wound area. Still with [name] completely nonviable surface Paradoxically the area mirror image on the right on the right dorsal foot appears to be getting better. He had some loss of dry denuded skin from the lower part of his original wound on the left lateral calf. Some of this area looked [name] little vulnerable and for this reason we put him in wrap that on this side this week The area on his left buttock is larger. He still has the erythematous circular area which I think is [name] combination of pressure, sweat. This does not look like cellulitis or fungal dermatitis 11/26/2019; -Dorsal left foot large open wound with depth. Still debris over the surface. Using Sorbact  The area on the dorsal right foot paradoxically has closed over He has [name] reopening on the left ankle laterally at the base of his original wound that extended up into the calf. This appears clean. The left buttock wound is smaller but with very adherent necrotic debris over the surface. We have been using silver alginate here as well The patient had arterial studies done in 2017. He had biphasic waveforms at the dorsalis pedis and posterior tibial bilaterally. ABI in the left was 1.17. Digit waveforms were dampened. He has slight spasticity in the great toes I do not think [name] TBI would be possible 1/11; the patient comes in today with [name] sizable reopening between the first and second toes on the right. This is not exactly in the same location where we have been treating wounds previously. According to our intake nurse this was actually fairly deep but 0.6 cm. The area on the left dorsal foot looks about the same the surface is somewhat cleaner using Sorbact, his MRI is in 2 days. We have not  managed yet to get arterial studies. The new reopening on the left lateral calf looks somewhat better using alginate. The left buttock wound is about the same using alginate 1/18; the patient had his ARTERIAL studies which were quite normal. ABI in the right at 1.13 with triphasic/biphasic waveforms on the left ABI 1.06 again with triphasic/biphasic waveforms. It would not have been possible to have done [name] toe brachial index because of spasticity. We have been using Sorbac to the left foot alginate to the rest of his wounds on the right foot left lateral calf and left buttock 1/25; arrives in clinic with erythema and swelling of the left forefoot worse over the first MTP area. This extends laterally dorsally and but also posteriorly. Still has an area on the left lateral part of the lower part of his calf wound it is eschared and clearly not closed. Area on the left buttock still with surrounding irritation and erythema. Right foot surface wound dorsally. The area between the right and first and second toes appears better. 2/1; The left foot wound is about the same. Erythema slightly better I gave him [name] week of doxycycline empirically Right foot wound is more extensive extending between the toes to the plantar surface Left lateral calf really no open surface on the inferior part of his original wound however the entire area still looks vulnerable Absolutely no improvement in the left buttock wound required debridement. 2/8; the left foot is about the same. Erythema is slightly improved I gave him clindamycin last week. Right foot looks better he is using Lotrimin and silver alginate He has [name] breakdown in the left lateral calf. Denuded epithelium which I have removed Left buttock about the same were using Hydrofera Blue 2/15; left foot is about the same there is less surrounding erythema. Surface still has tightly adherent debris which I have debriding however not making any progress Right foot  has [name] substantial wound on the medial right second toe between the first and second webspace. Still an open area on the left lateral calf distal area. Buttock wound is about the same 2/22; left foot is about the same less surrounding erythema. Surface has adherent debris. Polymen Ag Right foot area significant wound between the first and second toes. We have been using silver alginate here Left lateral leg polymen Ag at the base of his original venous insufficiency wound Left buttock some improvement here 3/1; Right foot is deteriorating  in the first second toe webspace. Larger and more substantial. We have been using silver alginate. Left dorsal foot about the same markedly adherent surface debris using PolyMem Ag Left lateral calf surface debris using PolyMem AG Left buttock is improved again using PolyMem Ag. He is completing his terbinafine. The erythema in the foot seems better. He has been on this for 2 weeks 3/8; no improvement in any wound area in fact he has [name] small open area on the dorsal midfoot which is new this week. He has not gotten his foot x-rays yet 3/15; his x-rays were both negative for osteomyelitis of both feet. No major change in any of his wounds on the extremities however his buttock wounds are better. We have been using polymen on the buttocks, left lower leg. Iodoflex on the left foot and silver alginate on the right 3/22; arrives in clinic today with the 2 major issues are the improvement in the left dorsal foot wound which for once actually looks healthy with [name] nice healthy wound surface without debridement. Using Iodoflex here. Unfortunately on the left lateral calf which is in the distal part of his original wound he came to the clinic here for there was purulent drainage noted some increased breakdown scattered around the original area and [name] small area proximally. We we are using polymen here will change to silver alginate today. His buttock wound on the left is  better and I think the area on the right first second toe webspace is also improved 3/29; left dorsal foot looks better. Using Iodoflex. Left ankle culture from deterioration last time grew [name]. coli, Enterobacter and Enterococcus. I will give him [name] course of cefdinir although that will not cover Enterococcus. The area on the right foot in the webspace of the first and second toe lateral first toe looks better. The area on his buttock is about healed Vascular appointment is on April 21. This is to look at his venous system vis--vis continued breakdown of the wounds on the left including the left lateral leg and left dorsal foot he. He has had previous ablations on this side 4/5; the area between the right first and second toes lateral aspect of the first toe looks better. Dorsal aspect of the left first toe on the left foot also improved. Unfortunately the left lateral lower leg is larger and there is [name] second satellite wound superiorly. The usual superficial abrasions on the left buttock overall better but certainly not closed 4/12; the area between the right first and second toes is improved. Dorsal aspect of the left foot also slightly smaller with [name] vibrant healthy looking surface. No real change in the left lateral leg and the left buttock wound is healed He has an unaffordable co-pay for Apligraf. Appointment with vein and vascular with regards to the left leg venous part of the circulation is on 4/21 4/19; we continue to see improvement in all wound areas. Although this is minor. He has his vascular appointment on 4/21. The area on the left buttock has not reopened although right in the center of this area the skin looks somewhat threatened 4/26; the left buttock is unfortunately reopened. In general his left dorsal foot has [name] healthy surface and looks somewhat smaller although it was not measured as such. The area between his first and second toe webspace on the right as [name] small wound against  the first toe. Bisson, MASYN ROSTRO ([id number]) [id number] Page 12 of 37 The patient saw vascular surgery. The  real question I was asking was about the small saphenous vein on the left. He has previously ablated left greater saphenous vein. Nothing further was commented on on the left. Right greater saphenous vein without reflux at the saphenofemoral junction or proximal thigh there was no indication for ablation of the right greater saphenous vein duplex was negative for DVT bilaterally. They did not think there was anything from [name] vascular surgery point of view that could be offered. They ABIs within normal limits 5/3; only small open area on the left buttock. The area on the left lateral leg which was his original venous reflux is now 2 wounds both which look clean. We are using Iodoflex on the left dorsal foot which looks healthy and smaller. He is down to [name] very tiny area between the right first and second toes, using silver alginate 5/10; all of his wounds appear better. We have much better edema control in 4 layer compression on the left. This may be the factor that is allowing the left foot and left lateral calf to heal. He has external compression garments at home 04/14/20-All of his wounds are progressing well, the left forefoot is practically closed, left ischium appears to be about the same, right toe webspace is also smaller. The left lateral leg is about the same, continue using Hydrofera Blue to this, silver alginate to the ischium, Iodoflex to the toe space on the right 6/7; most of his wounds outside of the left buttock are doing well. The area on the left lateral calf and left dorsal foot are smaller. The area on the right foot in between the first and second toe webspace is barely visible although he still says there is some drainage here is the only reason I did not heal this out. Unfortunately the area on the left buttock almost looks like he has [name] skin tear  from tape. He has open wound and then [name] large flap of skin that we are trying to get adherence over an area just next to the remaining wound 6/21; 2 week follow-up. I believe is been here for nurse visits. Miraculously the area between his first and second toes on the left dorsal foot is closed over. Still open on the right first second web space. The left lateral calf has 2 open areas. Distally this is more superficial. The proximal area had [name] little more depth and required debridement of adherent necrotic material. His buttock wound is actually larger we have been using silver alginate here 6/28; the patient's area on the left foot remains closed. Still open wet area between the first and second toes on the right and also extending into the plantar aspect. We have been using silver alginate in this location. He has 2 areas on the left lower leg part of his original long wounds which I think are better. We have been using Hydrofera Blue here. Hydrofera Blue to the left buttock which is stable 7/12; left foot remains closed. Left ankle is closed. May be [name] small area between his right first and second toes the only truly open area is on the left buttock. We have been using Hydrofera Blue here 7/19; patient arrives with marked deterioration especially in the left foot and ankle. We did not put him in [name] compression wrap on the left last week in fact he wore his juxta lite stockings on either side although he does not have an underlying stocking. He has [name] reopening on the left dorsal foot, left lateral  ankle and [name] new area on the right dorsal ankle. More worrisome is the degree of erythema on the left foot extending on the lateral foot into the lateral lower leg on the left 7/26; the patient had erythema and drainage from the lateral left ankle last week. Culture of this grew MRSA resistant to doxycycline and clindamycin which are the 2 antibiotics we usually use with this patient who has multiple  antibiotic allergies including linezolid, trimethoprim sulfamethoxazole. I had give him an empiric doxycycline and he comes in the area certainly looks somewhat better although it is blotchy in his lower leg. He has not been systemically unwell. He has had areas on the left dorsal foot which is [name] reopening, chronic wounds on the left lateral ankle. Both of these I think are secondary to chronic venous insufficiency. The area between his first and second toes is closed as far as I can tell. He had [name] new wrap injury on the right dorsal ankle last week. Finally he has an area on the left buttock. We have been using silver alginate to everything except the left buttock we are using Hydrofera Blue 06/30/20-Patient returns at 1 week, has been given [name] sample dose pack of NUZYRA which is [name] tetracycline derivative [omadacycline], patient has completed those, we have been using silver alginate to almost all the wounds except the left ischium where we are using Hydrofera Blue all of them look better 8/16; since I last saw the patient he has been doing well. The area on the left buttock, left lateral ankle and left foot are all closed today. He has completed the [place] I gave him last time and tolerated this well. He still has open areas on the right dorsal ankle and in the right first second toe area which we are using silver alginate. 8/23; we put him in his bilateral external compression stockings last week as he did not have anything open on either leg except for concerning area between the right first and second toe. He comes in today with an area on the left dorsal foot slightly more proximal than the original wound, the left lateral foot but this is actually [name] continuation of the area he had on the left lateral ankle from last time. As well he is opened up on the left buttock again. 8/30; comes in today with things looking [name] lot better. The area on the left lower ankle has closed down as has the left foot but  with eschar in both areas. The area on the dorsal right ankle is also epithelialized. Very little remaining of the left buttock wound. We have been using silver alginate on all wound areas 9/13; the area in the first second toe webspace on the right has fully epithelialized. He still has some vulnerable epithelium on the right and the ankle and the dorsal foot. He notes weeping. He is using his juxta lite stocking. On the left again the left dorsal foot is closed left lateral ankle is closed. We went to the juxta lite stocking here as well. Still vulnerable in the left buttock although only 2 small open areas remain here 9/27; 2-week follow-up. We did not look at his left leg but the patient says everything is closed. He is [name] bit disturbed by the amount of edema in his left foot he is using juxta lite stockings but asking about over the toes stockings which would be 30/40, will talk to him next time. According to him there is no open  wound on either the left foot or the left ankle/calf He has an open area on the dorsal right calf which I initially point [name] wrap injury. He has superficial remaining wound on the left ischial tuberosity been using silver alginate although he says this sticks to the wound 10/5; we gave him 2-week follow-up but he called yesterday expressing some concerns about his right foot right ankle and the left buttock. He came in early. There is still no open areas on the left leg and that still in his juxta lite stocking 10/11; he only has 1 small area on the left buttock that remains measuring millimeters 1 mm. Still has the same irritated skin in this area. We recommended zinc oxide when this eventually closes and pressure relief is meticulously is he can do this. He still has an area on the dorsal part of his right first through third toes which is [name] bit irritated and still open and on the dorsal ankle near the crease of the ankle. We have been using silver alginate and using his  own stocking. He has nothing open on the left leg or foot 10/25; 2-week follow-up. Not nearly as good on the left buttock as I was hoping. For open areas with 5 looking threatened small. He has the erythematous irritated chronic skin in this area. 1 area on the right dorsal ankle. He reports this area bleeds easily Right dorsal foot just proximal to the base of his toes We have been using silver alginate. 11/8; 2-week follow-up. Left buttock is about the same although I do not think the wounds are in the same location we have been using silver alginate. I have asked him to use zinc oxide on the skin around the wounds. He still has [name] small area on the right dorsal ankle he reports this bleeds easily Right dorsal foot just proximal to the base of the toes does not have anything open although the skin is very dry and scaly He has [name] new opening on the nailbed of the left great toe. Nothing on the left ankle 11/29; 3-week follow-up. Left buttock has 2 open areas. And washing of these wounds today started bleeding easily. Suggesting very friable tissue. We have been using silver alginate. Right dorsal ankle which I thought was initially [name] wrap injury we have been using silver alginate. Nothing open between the toes that I can see. He states the area on the left dorsal toe nailbed healed after the last visit in 2 or 3 days 12/13; 3-week follow-up. His left buttock now has 3 open areas but the original 2 areas are smaller using polymen here. Surrounding skin looks better. The right dorsal ankle is closed. He has [name] small opening on the right dorsal foot at the level of the third toe. In general the skin looks better here. He is wearing his juxta lite stocking on the left leg says there is nothing open 11/24/2020; 3 weeks follow-up. His left buttock still has the 3 open areas. We have been using polymen but due to lack of response he changed to [facility] area. Surrounding skin is dry erythematous and  irritated looking. There is no evidence of infection either bacterial or fungal however there is loss of surface epithelium He still has very dry skin in his foot causing irritation and erythema on the dorsal part of his toes. This is not responded to prolonged courses of antifungal simply looks dry and irritated 1/24; left buttock area still looks about the  same he was unable to find the triad ointment that we had suggested. The area on the right lower leg just above the dorsal ankle has reopened and the areas on the right foot between the first second and second third toes and scaling on the bottom of the foot has been about the same for quite some time now. been using silver alginate to all wound areas 2/7; left buttock wound looked quite good although not much smaller in terms of surface area surrounding skin looks better. Only [name] few dry flaking areas on the right foot in between the first and second toes the skin generally looks better here [ammonium lactate]. Finally the area on the right dorsal ankle is closed 2/21; There is no open area on the right foot even between the right first and second toe. Skin around this area dorsally and plantar aspects look better. He has [name] reopening of the area on the right ankle just above the crease of the ankle dorsally. I continue to think that this is probably friction from spasms may be even this time with his stocking under the compression stockings. Wounds on his left buttock look about the same there [name] couple of areas that have reopened. He has [name] total square area of loss of epithelialization. This does [place], Vernal [name] ([id number]) [id number] Page 13 of 37 not look like infection it looks like [name] contact dermatitis but I just cannot determine to what 3/14; there is nothing on the right foot between the first and second toes this was carefully inspected under illumination. Some chronic irritation on the dorsal part of his foot  from toes 1-3 at the base. Nothing really open here substantially. Still has an area on the right foot/ankle that is actually larger and hyper granulated. His buttock area on the left is just about closed however he has chronic inflammation with loss of the surface epithelial layer 3/28; 2-week follow-up. In clinic today with [name] new wound on the left anterior mid tibia. Says this happened about 2 weeks ago. He is not really sure how wonders about the spasticity of his legs at night whether that could have caused this other than that he does not have [name] good idea. He has been using topical antibiotics and silver alginate. The area on his right dorsal ankle seems somewhat better. Finally everything on his left buttock is closed. 4/11; 2-week follow-up. All of his wounds are better except for the area over the ischium and left buttock which have opened up widely again. At least part of this is covered in necrotic fibrinous material another part had rolled nonviable skin. The area on the right ankle, left anterior mid tibia are both [name] lot better. He had no open wounds on either foot including the areas between the first and second toes 4/25; patient presents for 2-week follow-up. He states that the wounds are overall stable. He has no complaints today and states he is using Hydrofera Blue to open wounds. 5/9; have not seen this man in over [name] month. For my memory he has open areas on the left mid tibia and right ankle. T oday he has new open area on the right dorsal foot which we have not had [name] problem with recently. He has the sustained area on the left buttock He is also changed his insurance at the beginning of the year [employer]. We will need prior authorizations for debridement 5/23; patient presents for 2-week follow-up. He has prior authorizations for debridement. He  denies any issues in the past 2 weeks with his wound care. He has been using Hydrofera Blue to all the wounds. He does report [name]  circular rash to the upper left leg that is new. He denies acute signs of infection. 6/6; 2-week follow-up. The patient has open wounds on the left buttock which are worse than the last time I saw this about [name] month ago. He also has [name] new area to me on the left anterior mid tibia with some surrounding erythema. The area on the dorsal ankle on the right is closed but I think this will be [name] friction injury every time this area is exposed to either our wraps or his compression stockings caused by unrelenting spasms in this leg. 6/20; 2-week follow-up. The patient has open wounds on the left buttock which is about the same. Using [facility] here. - The left mid tibia has [name] static amount of surrounding erythema. Also [name] raised area in the center. We have been using Hydrofera Blue here. Finally he has broken down in his dorsal right foot extending between the first and second toes and going to the base of the first and second toe webspace. I have previously assumed that this was severe venous hypertension 7/5; 2-week follow-up The left buttock wound actually looks better. We are using Hydrofera Blue. He has extensive skin irritation around this area and I have not really been able to get that any better. I have tried Lotrisone i.[name]. antifungals and steroids. More most recently we have just been using Coloplast really looks about the same. The left mid tibia which was new last week culture to have very resistant staph aureus. Not only methicillin-resistant but doxycycline resistant. The patient has [name] plethora of antibiotic allergies including sulfa, linezolid. I used topical bacitracin on this but he has not started this yet. In addition he has an expanding area of erythema with [name] wound on the dorsal right foot. I did [name] deep tissue culture of this area today 7/12; Left buttock area actually looks better surrounding skin also looks less irritated. Left mid tibia looks about the same. He is using  bacitracin this is not worse Right dorsal foot looks about the same as well. The left first toe also looks about the same 7/19; left buttock wound continues to improve in terms of open areas Left mid tibia is still concerning amount of swelling he is using bacitracin Dorsal left first toe somewhat smaller Right dorsal foot somewhat smaller 7/25; left buttock wound actually continues to improve Left mid tibia area has less swelling. I gave him all my samples of new Nuzyra. This seems to have helped although the wound is still open it. His abrasion closed by here Left dorsal great toe really no better. Still [name] very nonviable surface Right dorsal foot perhaps some better. We have been using bacitracin and silver nitrate to the areas on his lower legs and Hydrofera Blue to the area on the buttock. 8/16 Disappointed that his left buttock wound is actually more substantial. Apparently during the last nurse visit these were both very small. He has continued irritation to [name] large area of skin on his buttock. I have never been able to totally explain this although I think it some combination of the way he sits, pressure, moisture. He is not incontinent enough to contribute to this. Left dorsal great toe still fibrinous debris on the surface that I have debrided today Large area across the dorsal right toes. The area  on the left anterior mid tibia has less swelling. He completed the [place]. This does not look infected although the tissue is still fried 8/30; 2-week follow-up. Left buttock areas not improved. We used Hydrofera Blue on this. Weeping wet with the surrounding erythema that I have not been able to control even with Lotrisone and topical Coloplast Left dorsal great toe looks about the same More substantial area again at the base of his toes on the left which is new this week. Area across the dorsal right toes looks improved The left anterior mid tibia looks like it is trying to  close 9/13; 2-week follow-up. Using silver alginate on all of his wounds. The left dorsal foot does not look any better. He has the area on the dorsal toe and also the areas at the base of all of his toes 1 through 3. On the right foot he has [name] similar pattern in [name] similar area. He has the area on his left mid tibia that looks fairly healthy. Finally the area on his left buttock looks somewhat bette 9/20; culture I did of the left foot which was [name] deep tissue culture last time showed [name]. coli he has erythema around this wound. Still [name] completely necrotic surface. His right dorsal foot looks about the same. He has [name] very friable surface to the left anterior mid tibia. Both buttock wounds look better. We have been using silver alginate to all wounds 10/4; he has completed the cephalexin that I gave him last time for the left foot. He is using topical gentamicin under silver alginate silver alginate being applied to all the wounds. Unfortunately all the wounds look irritated on his dorsal right foot dorsal left foot left mid tibia. I wonder if this could be [name] silver allergy. I am going to change him to [facility] on the lower extremity. The skin on the left buttock and left posterior thigh still flaking dry and irritated. This has continued no matter what I have applied topically to this. He has [name] solitary open wound which by itself does not look too bad however the entire area of surrounding skin does not change no matter what we have applied here 10/18; the area on the left dorsal foot and right dorsal foot both look better. The area on the right extends into the plantar but not between his toes. We have been using silver alginate. He still has [name] rectangular erythematous area around the area on the left tibia. The wound itself is very small. Finally everything on his left buttock looks [name] little larger the skin is erythematous 11/15; patient comes in with the left dorsal and right dorsal foot  distally looking somewhat better. Still nonviable surface on the left foot which required debridement. He still has the area on the left anterior mid tibia although this looks somewhat better. He has [name] new area on the right lateral lower leg just above the ankle. Finally his left buttock looks terrible with multiple superficial open wounds geometric square shaped area of chronic erythema which I have not been able to sort through 11/29; right dorsal foot and left dorsal foot both look somewhat better. No debridement required. He has the fragile area on the left anterior mid tibia this looks and continues to look somewhat better. Right lateral lower leg just above the ankle we identified last time also looks better. In general the area on his left buttock looks improved. We are using Hydrofera Blue to all wound areas 12/13;  right dorsal foot looks better. The area on the right lateral leg is healed. Left dorsal foot has 2 open areas both of which require debridement. The fragile area on the left anterior mid tibial looks better. Smaller area on his buttocks. Were using Hydrofera Blue Pepper, Joseluis [name] ([id number]) [id number] Page 14 of 37 1/10; patient comes in with everything looking slightly larger and/or worse. This includes his left buttock, reopening of the left mid tibia, larger areas on the left dorsal foot and what looks to be [name] cellulitis on the right dorsal and plantar foot. We have been using Hydrofera Blue on all wounds. 1/17; right dorsal foot distally looks better today. The left foot has 2 open wounds that are about the same surrounding erythema. 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 No malignancy they did not stain for fungus 1/24; his right distal foot is not closed dry and scaly but the wound looks like it is contracting. I  did not debride anything here. Problem on his left dorsal foot with expanding erythema. Apparently there were problems last week getting the Elesa Hacker however it is now available at the [employer] [facility] but [name] week later. He is using ketoconazole and Coloplast to the left buttock along with Hydrofera Blue this actually looks quite [name] bit better today. 1/31; right dorsal foot again is dry and scaly but looking to contract. He has been using [name] moisturizer on his feet at my request but he is not sure which 1. The left dorsal foot wounds look about the same there is erythema here that I marked last week however after course of Nuzyra it certainly is not any better but not any worse either. Finally on his left buttock the skin continues to look better he has the original wound but [name] new substantial area towards the gluteal cleft. Almost like [name] skin tear. I used scissors to remove skin and subcutaneous tissue here silver nitrate and direct pressure 2/7; right dorsal foot. This does not look too much different from last week. Some erythema skin dry and scaly. No debridement. Left dorsal great toe again still not much improvement. I did remove flaking dry skin and callus from around the edge. Finally on his left buttock. The skin is somewhat better in the periwound. Surface wounds are superficial somewhat better than last week. 01/26/2022: Is [name] little bit of [name] mystery as to why his wounds fail to respond to treatments and actually seem to get worse. This is my first encounter with this patient. He was previously followed by Dr. Dellia Nims. Based upon my review of the chart, it seems that there is [name] little bit of [name] mystery as to why his wounds do not respond as anticipated to the interventions applied and sometimes even get worse. Biopsies have been performed and he was seen by dermatology in [place], but that did not shed any light on the matter. T oday, his gluteal wound is larger, with substantial drainage,  rather malodorous. The food wounds are not terrible, but he has [name] lot of callus and scaly skin around these. He is currently getting silver alginate on the gluteal wound, with idodoflex to the feet. He is using lotrisone on his legs for the dry, scaly skin. 02/09/2022: There has really been no change to any of his wounds. The gluteal wound less drainage and odor, but remains about the same size, the  periwound skin remains oddly scaly. His lower extremity wounds also appear roughly the same size. They continue to accumulate [name] small amount of slough. The periwound on his feet and ankle wounds has dry eschar and loose dead skin. We have been using silver alginate on the gluteal wound and Iodoflex on his feet and ankle wounds. T the periwound around his gluteal lesion and Lotrisone on his feet and legs. o 02/23/2022: The right plantar foot wound is closed. The gluteal site looks small but has continued to produce hypertrophic granulation tissue. The foot wounds all look about the same on the dorsal surface of the right foot; on the left, there is only [name] small open area at the site of where his left great toenail would have been. 03/16/2022: The right ankle wound is healed. The right dorsal foot wound is about the same. The left dorsal foot wound is quite [name] bit smaller and the ischial wound is nearly closed. 03/30/2022: The right ankle wound reopened. Both dorsal foot wounds are quite [name] bit smaller. Unfortunately, he appears to have sheared part of his ischial wound open further, perhaps during [name] transfer. 04/13/2022: The right ankle wound has hypertrophic granulation tissue present. The dorsal foot wounds continue to decrease in size. The ischial wound looks about the same today, no better, no worse. 04/27/2022: The right ankle wound has closed. Unfortunately, it looks like some moisture got underneath the dry skin on both of his dorsal feet and these wounds have expanded in size. The ischial wound remains the  same with perhaps [name] little bit more slough accumulation than at our previous visit. 05/11/2022: The right ankle wound remains closed. There is [name] left anterior tibial wound that is small has patchy openings with accumulated slough. The dorsum of his right foot appears to be nearly healed with just [name] small punctate opening. The plantar surface of his right foot has [name] new opening that looks like he may have picked some skin there. His sacral ulcer has hypertrophic granulation tissue but has some slough accumulation. The dorsum of his left foot has multiple open areas in [name] fairly ragged distribution. All of these have slough accumulated within them. 06/01/2022: The right ankle and left anterior tibial wound are both closed. Dorsum of his right foot and left foot both look substantially better with just tiny scattered openings The without any slough accumulation. He has sheared open new areas on his left gluteus and ischium. He says that his wheelchair cushion, which is air-filled, has [name] leak and so it keeps deflating. He is awaiting [name] new cushion. 06/15/2022: The right ankle wound has reopened and the fat layer is exposed. Both dorsal feet have just small openings with just [name] little bit of slough and eschar accumulation. The wound on his left gluteus and ischium is larger again today and has [name] foul odor. 06/29/2022: The right ankle wound has hypertrophic granulation tissue buildup. His dorsal foot wounds both look better with just some eschar on the surface. He has [name] new wound on his left lateral ankle. He is not sure how he acquired it but by appearance, it looks that he hit it on something, potentially his wheelchair or bed. The ischial wound is about the same but is cleaner without any significant purulent drainage or odor. He did not understand what the [facility] call was about and therefore he does not have the topical compounded antibiotic. 07/13/2022: The right ankle wound again has hypertrophic  granulation tissue, but less so than  at his previous visit. The ischial wound has improved tremendously the use of the [facility] topical antibiotic. No significant change to the left lateral ankle wound; it is fibrotic with slough present. The skin of both of his feet, especially on the right, has [name] yeasty appearance. 08/10/2022: There is again hypertrophic granulation tissue on the right anterior ankle wound. Both feet are about the same. The left lateral ankle wound is [name] little bit desiccated and has some slough buildup. He unfortunately suffered [name] new injury when he was removing his pants and they caught his bandage which caused [name] large skin tear on his left ischium, just distal to the existing wound. The existing left ischial wound, however, is significantly better with just [name] little light slough on the surface. 08/31/2022: The right anterior ankle wound is [name] lot smaller today underneath some eschar. No accumulation of hypertrophic granulation tissue. The left lateral ankle wound has some slough on the surface but is better in terms of moisture balance this week. The left dorsal foot does not really have any openings on it today. The right dorsal foot has some slough and eschar accumulation. His gluteal ulcer is basically closed aside from 2 small areas that are oozing [name] bit. Electronic Signature(s) Signed: 08/31/2022 9:23:05 AM By: Fredirick Maudlin MD FACS Entered By: Fredirick Maudlin on 08/31/2022 09:23:04 Achey, [name] ([id number]) [id number] Page 15 of 37 -------------------------------------------------------------------------------- Chemical Cauterization Details Patient Name: Date of Service: Sweney, [name] 08/31/2022 8:15 [name] Medical Record Number: [id number] Patient Account Number: [id number] Date of Birth/Sex: Treating RN: Mar 31, 1986 (36 y.o. Waldron Session Primary Care Provider: [place], [place] Other Clinician: Referring Provider: Treating  Provider/Extender: Cornelia Copa [name]: [phone] Procedure Performed for: Wound #41R Left Ischium Performed By: Physician Fredirick Maudlin, MD Post Procedure Diagnosis Same as Pre-procedure Notes Scribed for Dr. Celine Ahr by Blanche East, RN Electronic Signature(s) Signed: 08/31/2022 11:22:15 AM By: Fredirick Maudlin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 with [name] curette afterwards the tissue appears healthier. Left foot wound appears improved in the area on the dorsal right second toe is improved 08/30/16 patient developed [name] fever over the weekend and was seen in an urgent care. Felt to have [name] UTI and put on doxycycline. He has been since changed over the phone to [facility]. After we took off the wrap on his right leg today the leg is swollen warm and erythematous, probably more likely the source of the fever 09/06/16; have been using collagen to the major left leg wound, silver alginate to the area on his anterior foot/toes 09/13/16; the areas on his anterior foot/toes on both sides appear to be virtually closed. Extensive wound on the left lateral leg perhaps slightly narrower but each visit still covered an adherent surface slough 09/16/16 patient was in for his usual Thursday nurse visit however the intake nurse noted significant erythema of his dorsal right foot. He is also running [name] low- grade fever and having increasing spasms in the right  leg 09/20/16 here for cellulitis involving his right great toes and forefoot. This is [name] lot better. Still  requiring debridement on his left lateral leg. Santyl direct says he needs prior authorization. Therefore his wife cannot change this at home 09/30/16; the patient's extensive area on the left lateral calf and ankle perhaps somewhat better. Using Santyl. The area on the left toes is healed and I think the area on his right dorsal foot is healed as well. There is no cellulitis or venous inflammation involving the right leg. He is going to need compression stockings here. 10/07/16; the patient's extensive wound on the left lateral calf and ankle does not measure any differently however there appears to be less adherent surface slough using Santyl and aggressive weekly debridements 10/21/16; no major change in the area on the left lateral calf. Still the same measurement still very difficult to debridement adherent slough and nonviable subcutaneous tissue. This is not really been helped by several weeks of Santyl. Previously for 2 weeks I used Iodoflex for [name] short period. [name] prolonged course of Hydrofera Blue didn't really help. I'm not sure why I only used 2 weeks of Iodoflex on this there is no evidence of surrounding infection. He has [name] small area on the right second toe which looks as though it's progressing towards closure 10/28/16; the wounds on his toes appear to be closed. No major change in the left lateral leg wound although the surface looks somewhat better using Iodoflex. He has had previous arterial studies that were normal. He has had reflux studies and is status post ablation although I don't have any exact notes on which vein was ablated. I'll need to check the surgical record 11/04/16; he's had [name] reopening between the first and second toe on the left and right. No major change in the left lateral leg wound. There is what appears to be cellulitis of the left dorsal foot 11/18/16 the  patient was hospitalized initially in [place] and then subsequently transferred to [facility] long and was admitted there from 11/09/16 through 11/12/16. He had developed progressive cellulitis on the right leg in spite of the doxycycline I gave him. I'd spoken to the hospitalist in [place] who was concerned about continuing leukocytosis. CT scan is what I suggested this was done which showed soft tissue swelling without evidence of osteomyelitis or an underlying abscess blood cultures were negative. At [facility] he was treated with vancomycin and Primaxin and then add an infectious disease consult. He was transitioned to Ceftaroline. He has been making progressive improvement. Overall [name] severe cellulitis of the right leg. He is been using silver alginate to her original wound on the left leg. The wounds in his toes on the right are closed there is [name] small open area on the base of the left second toe 11/26/15; the patient's right leg is much better although there is still some edema here this could be reminiscent from his severe cellulitis likely on top of some degree of lymphedema. His left anterior leg wound has less surface slough as reported by her intake nurse. Small wound at the base of the left second toe 12/02/16; patient's right leg is better and there is no open wound here. His left anterior lateral leg wound continues to have [name] healthy-looking surface. Small wound at the base of the left second toe however there is erythema in the left forefoot which is worrisome 12/16/16; is no open wounds on his right leg. We took measurements for stockings. His left anterior lateral leg wound continues to have [name] healthy-looking surface. I'm not sure where we were with the  Apligraf run through his insurance. We have been using Iodoflex. He has [name] thick eschar on the left first second toe interface, I suspect this may be fungal however there is no visible open 12/23/16; no open wound on his right leg. He has 2 small  areas left of the linear wound that was remaining last week. We have been using Prisma, I thought I have disclosed this week, we can only look forward to next week 01/03/17; the patient had concerning areas of erythema last week, already on doxycycline for UTI through his primary doctor. The erythema is absolutely no better there is warmth and swelling both medially from the left lateral leg wound and also the dorsal left foot. 01/06/17- Patient is here for follow-up evaluation of his left lateral leg ulcer and bilateral feet ulcers. He is on oral antibiotic therapy, tolerating that. Nursing staff and the patient states that the erythema is improved from Monday. 01/13/17; the predominant left lateral leg wound continues to be problematic. I had put Apligraf on him earlier this month once. However he subsequently developed what appeared to be an intense cellulitis around the left lateral leg wound. I gave him Dalvance I think on 2/12 perhaps 2/13 he continues on cefdinir. The erythema is still present but the warmth and swelling is improved. I am hopeful that the cellulitis part of this control. I wouldn't be surprised if there is an element of venous inflammation as well. 01/17/17. The erythema is present but better in the left leg. His left lateral leg wound still does not have [name] viable surface buttons certain parts of this long thin wound it appears like there has been improvement in dimensions. 01/20/17; the erythema still present but much better in the left leg. I'm thinking this is his usual degree of chronic venous inflammation. The wound on the left leg looks somewhat better. Is less surface slough 01/27/17; erythema is back to the chronic venous inflammation. The wound on the left leg is somewhat better. I am back to the point where I like to try an Apligraf once again 02/10/17; slight improvement in wound dimensions. Apligraf #2. He is completing his doxycycline 02/14/17; patient arrives today having  completed doxycycline last Thursday. This was supposed to be [name] nurse visit however once again he hasn't tense erythema from the medial part of his wound extending over the lower leg. Also erythema in his foot this is roughly in the same distribution as last time. He has Jeanbaptiste, Kweku [name] ([id number]) [id number] Page 22 of 37 baseline chronic venous inflammation however this is [name] lot worse than the baseline I have learned to accept the on him is baseline inflammation 02/24/17- patient is here for follow-up evaluation. He is tolerating compression therapy. His voicing no complaints or concerns he is here anticipating an Apligraf 03/03/17; he arrives today with an adherent necrotic surface. I don't think this is surface is going to be amenable for Apligraf's. The erythema around his wound and on the left dorsal foot has resolved he is off antibiotics 03/10/17; better-looking surface today. I don't think he can tolerate Apligraf's. He tells me he had [name] wound VAC after [name] skin graft years ago to this area and they had difficulty with [name] seal. The erythema continues to be stable around this some degree of chronic venous inflammation but he also has recurrent cellulitis. We have been using Iodoflex 03/17/17; continued improvement in the surface and may be small changes in dimensions. Using Iodoflex which seems the only thing  that will control his surface 03/24/17- He is here for follow up evaluation of his LLE lateral ulceration and ulcer to right dorsal foot/toe space. He is voicing no complaints or concerns, He is tolerating compression wrap. 03/31/17 arrives today with [name] much healthier looking wound on the left lower extremity. We have been using Iodoflex for [name] prolonged period of time which has for the first time prepared and adequate looking wound bed although we have not had much in the way of wound dimension improvement. He also has [name] small wound between the first and second toe on the  right 04/07/17; arrives today with [name] healthy-looking wound bed and at least the top 50% of this wound appears to be now her. No debridement was required I have changed him to [facility] last week after prolonged Iodoflex. He did not do well with Apligraf's. We've had [name] re-opening between the first and second toe on the right 04/14/17; arrives today with [name] healthier looking wound bed contractions and the top 50% of this wound and some on the lesser 50%. Wound bed appears healthy. The area between the first and second toe on the right still remains problematic 04/21/17; continued very gradual improvement. Using [facility] 04/28/17; continued very gradual improvement in the left lateral leg venous insufficiency wound. His periwound erythema is very mild. We have been using Hydrofera Blue. Wound is making progress especially in the superior 50% 05/05/17; he continues to have very gradual improvement in the left lateral venous insufficiency wound. Both in terms with an length rings are improving. I debrided this every 2 weeks with #5 curet and we have been using Hydrofera Blue and again making good progress With regards to the wounds between his right first and second toe which I thought might of been tinea pedis he is not making as much progress very dry scaly skin over the area. Also the area at the base of the left first and second toe in [name] similar condition 05/12/17; continued gradual improvement in the refractory left lateral venous insufficiency wound on the left. Dimension smaller. Surface still requiring debridement using Hydrofera Blue 05/19/17; continued gradual improvement in the refractory left lateral venous ulceration. Careful inspection of the wound bed underlying rumination suggested some degree of epithelialization over the surface no debridement indicated. Continue Hydrofera Blue difficult areas between his toes first and third on the left than first and second on the right. I'm going  to change to silver alginate from silver collagen. Continue ketoconazole as I suspect underlying tinea pedis 05/26/17; left lateral leg venous insufficiency wound. We've been using Hydrofera Blue. I believe that there is expanding epithelialization over the surface of the wound albeit not coming from the wound circumference. This is [name] bit of an odd situation in which the epithelialization seems to be coming from the surface of the wound rather than in the exact circumference. There is still small open areas mostly along the lateral margin of the wound. ooHe has unchanged areas between the left first and second and the right first second toes which I been treating for tenia pedis 06/02/17; left lateral leg venous insufficiency wound. We have been using Hydrofera Blue. Somewhat smaller from the wound circumference. The surface of the wound remains [name] bit on it almost epithelialized sedation in appearance. I use an open curette today debridement in the surface of all of this especially the edges ooSmall open wounds remaining on the dorsal right first and second toe interspace and the plantar left first  second toe and her face on the left 06/09/17; wound on the left lateral leg continues to be smaller but very gradual and very dry surface using Hydrofera Blue 06/16/17 requires weekly debridements now on the left lateral leg although this continues to contract. I changed to silver collagen last week because of dryness of the wound bed. Using Iodoflex to the areas on his first and second toes/web space bilaterally 06/24/17; patient with history of paraplegia also chronic venous insufficiency with lymphedema. Has [name] very difficult wound on the left lateral leg. This has been gradually reducing in terms of with but comes in with [name] very dry adherent surface. High switch to silver collagen [name] week or so ago with hydrogel to keep the area moist. This is been refractory to multiple dressing attempts. He also has areas in  his first and second toes bilaterally in the anterior and posterior web space. I had been using Iodoflex here after [name] prolonged course of silver alginate with ketoconazole was ineffective [question tinea pedis] 07/14/17; patient arrives today with [name] very difficult adherent material over his left lateral lower leg wound. He also has surrounding erythema and poorly controlled edema. He was switched his Santyl last visit which the nurses are applying once during his doctor visit and once on [name] nurse visit. He was also reduced to 2 layer compression I'm not exactly sure of the issue here. 07/21/17; better surface today after 1 week of Iodoflex. Significant cellulitis that we treated last week also better. [Doxycycline] 07/28/17 better surface today with now 2 weeks of Iodoflex. Significant cellulitis treated with doxycycline. He has now completed the doxycycline and he is back to his usual degree of chronic venous inflammation/stasis dermatitis. He reminds me he has had ablations surgery here 08/04/17; continued improvement with Iodoflex to the left lateral leg wound in terms of the surface of the wound although the dimensions are better. 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 Problematic areas on the plantar aspect of the first second toe web space on the left and the dorsal aspect of the first second toe web space on the right. At one point I felt these were probably related to chronic fungal infections in treated him aggressively for this although we have not made any improvement here. 08/11/17; left lateral leg. Surface continues to improve with the Iodoflex although we are not seeing much improvement in overall wound dimensions. Areas on his plantar left foot and right foot show no improvement. In fact the right foot looks somewhat worse 08/18/17; left lateral leg. We changed to [facility] Blue last week after [name] prolonged course of  Iodoflex which helps get the surface better. It appears that the wound with is improved. Continue with difficult areas on the left dorsal first second and plantar first second on the right 09/01/17; patient arrives in clinic today having had [name] temperature of 103 yesterday. He was seen in the ER and [facility]. The patient was concerned he could have cellulitis again in the right leg however they diagnosed him with [name] UTI and he is now on Keflex. He has [name] history of cellulitis which is been recurrent and difficult but this is been in the left leg, in the past 5 use doxycycline. He does in and out catheterizations at home which are risk factors for UTI 09/08/17; patient will be completing his Keflex this weekend. The erythema on the left leg is considerably better. He has [name] new  wound today on the medial part of the right leg small superficial almost looks like [name] skin tear. He has worsening of the area on the right dorsal first and second toe. His major area on the left lateral leg is better. Using Hydrofera Blue on all areas 09/15/17; gradual reduction in width on the long wound in the left lateral leg. No debridement required. He also has wounds on the plantar aspect of his left first second toe web space and on the dorsal aspect of the right first second toe web space. 09/22/17; there continues to be very gradual improvements in the dimensions of the left lateral leg wound. He hasn't round erythematous spot with might be pressure on his wheelchair. There is no evidence obviously of infection no purulence no warmth ooHe has [name] dry scaled area on the plantar aspect of the left first second toe ooImproved area on the dorsal right first second toe. 09/29/17; left lateral leg wound continues to improve in dimensions mostly with an is still [name] fairly long but increasingly narrow wound. ooHe has [name] dry scaled area on the plantar aspect of his left first second toe web space ooIncreasingly concerning area on the  dorsal right first second toe. In fact I am concerned today about possible cellulitis around this wound. The areas extending up his second toe and although there is deformities here almost appears to abut on the nailbed. 10/06/17; left lateral leg wound continues to make very gradual progress. Tissue culture I did from the right first second toe dorsal foot last time grew MRSA and enterococcus which was vancomycin sensitive. This was not sensitive to clindamycin or doxycycline. He is allergic to Zyvox and sulfa we have therefore arrange for him to have dalvance infusion tomorrow. He is had this in the past and tolerated it well 10/20/17; left lateral leg wound continues to make decent progress. This is certainly reduced in terms of with there is advancing epithelialization.ooThe cellulitis in the right foot looks better although he still has [name] deep wound in the dorsal aspect of the first second toe web space. Plantar left first toe web space on the left I think is making some progress 10/27/17; left lateral leg wound continues to make decent progress. Advancing epithelialization.using Hydrofera Blue ooThe right first second toe web space wound is better-looking using silver alginate ooImprovement in the left plantar first second toe web space. Again using silver alginate 11/03/17 left lateral leg wound continues to make decent progress albeit slowly. Using Hydrofera Blue ooThe right per second toe web space continues to be [name] very problematic looking punched out wound. I obtained [name] piece of tissue for deep culture I did extensively treated this for fungus. It is difficult to imagine that this is [name] pressure area as the patient states other than going outside he doesn't really wear shoes at home ooThe left plantar first second toe web space looked fairly senescent. Necrotic edges. This required debridement Thorp, Loyce [name] ([id number]) [id number] Page 23 of 37 oochange to  [facility] to all wound areas 11/10/17; left lateral leg wound continues to contract. Using Hydrofera Blue ooOn the right dorsal first second toe web space dorsally. Culture I did of this area last week grew MRSA there is not an easy oral option in this patient was multiple antibiotic allergies or intolerances. This was only [name] rare culture isolate I'm therefore going to use Bactroban under silver alginate ooOn the left plantar first second toe web space. Debridement is required here. This  is also unchanged 11/17/17; left lateral leg wound continues to contract using Hydrofera Blue this is no longer the major issue. ooThe major concern here is the right first second toe web space. He now has an open area going from dorsally to the plantar aspect. There is now wound on the inner lateral part of the first toe. Not [name] very viable surface on this. There is erythema spreading medially into the forefoot. ooNo major change in the left first second toe plantar wound 11/24/17; left lateral leg wound continues to contract using Hydrofera Blue. Nice improvement today ooThe right first second toe web space all of this looks [name] lot less angry than last week. I have given him clindamycin and topical Bactroban for MRSA and terbinafine for the possibility of underlining tinea pedis that I could not control with ketoconazole. Looks somewhat better ooThe area on the plantar left first second toe web space is weeping with dried debris around the wound 12/01/17; left lateral leg wound continues to contract he Hydrofera Blue. It is becoming thinner in terms of with nevertheless it is making good improvement. ooThe right first second toe web space looks less angry but still [name] large necrotic-looking wounds starting on the plantar aspect of the right foot extending between the toes and now extensively on the base of the right second toe. I gave him clindamycin and topical Bactroban for MRSA anterior benefiting for  the possibility of underlying tinea pedis. Not looking better today ooThe area on the left first/second toe looks better. Debrided of necrotic debris 12/05/17* the patient was worked in urgently today because over the weekend he found blood on his incontinence bad when he woke up. He was found to have an ulcer by his wife who does most of his wound care. He came in today for [place] to look at this. He has not had [name] history of wounds in his buttocks in spite of his paraplegia. 12/08/17; seen in follow-up today at his usual appointment. He was seen earlier this week and found to have [name] new wound on his buttock. We also follow him for wounds on the left lateral leg, left first second toe web space and right first second toe web space 12/15/17; we have been using Hydrofera Blue to the left lateral leg which has improved. The right first second toe web space has also improved. Left first second toe web space plantar aspect looks stable. The left buttock has worsened using Santyl. Apparently the buttock has drainage 12/22/17; we have been using Hydrofera Blue to the left lateral leg which continues to improve now 2 small wounds separated by normal skin. He tells [place] he had [name] fever up to 100 yesterday he is prone to UTIs but has not noted anything different. He does in and out catheterizations. The area between the first and second toes today does not look good necrotic surface covered with what looks to be purulent drainage and erythema extending into the third toe. I had gotten this to something that I thought look better last time however it is not look good today. He also has [name] necrotic surface over the buttock wound which is expanded. I thought there might be infection under here so I removed [name] lot of the surface with [name] #5 curet though nothing look like it really needed culturing. He is been using Santyl to this area 12/27/17; his original wound on the left lateral leg continues to improve using Hydrofera Blue.  I gave him samples of  [place] although he was unable to take them out of fear for an allergic reaction ["lump in his throat"].the culture I did of the purulent drainage from his second toe last week showed both enterococcus and [name] set Enterobacter I was also concerned about the erythema on the bottom of his foot although paradoxically although this looks somewhat better today. 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 We've been using silver alginate here. They came up today that he sleeps in [name] recliner, I'm not sure why but I asked him to stop this 01/03/18; his original wound we've been using Hydrofera Blue is now separated into 2 areas. ooUlcer on his left buttock is better he is off the recliner and sleeping in bed ooFinally both wound areas between his first and second toes also looks some better 01/10/18; his original wound on the left lateral leg is now separated into 2 wounds we've been using Hydrofera Blue ooUlcer on his left buttock has some drainage. There is [name] small probing site going into muscle layer superiorly.using silver alginate -He arrives today with [name] deep tissue injury on the left heel ooThe wound on the dorsal aspect of his first second toe on the left looks [name] lot betterusing silver alginate ketoconazole ooThe area on the first second toe web space on the right also looks [name] lot bette 01/17/18; his original wound on the left lateral leg continues to progress using Hydrofera Blue ooUlcer on his left buttock also is smaller surface healthier except for [name] small probing site going into the muscle layer superiorly. 2.4 cm of tunneling in this area ooDTI on his left heel we have only been offloading. Looks better than last week no threatened open no evidence of infection oothe wound on the dorsal aspect of the first second toe on the left continues to look like it's regressing we have  only been using silver alginate and terbinafine orally ooThe area in the first second toe web space on the right also looks to be [name] lot better using silver alginate and terbinafine I think this was prompted by tinea pedis 01/31/18; the patient was hospitalized in [place] last week apparently for [name] complicated UTI. He was discharged on cefepime he does in and out catheterizations. In the hospital he was discovered M I don't mild elevation of AST and ALT and the terbinafine was stopped.predictably the pressure ulcer on s his buttock looks betterusing silver alginate. The area on the left lateral leg also is better using Hydrofera Blue. The area between the first and second toes on the left better. First and second toes on the right still substantial but better. Finally the DTI on the left heel has held together and looks like it's resolving 02/07/18-he is here in follow-up evaluation for multiple ulcerations. He has new injury to the lateral aspect of the last issue [name] pressure ulcer, he states this is from adhesive removal trauma. He states he has tried multiple adhesive products with no success. All other ulcers appear stable. The left heel DTI is resolving. We will continue with same treatment plan and follow-up next week. 02/14/18; follow-up for multiple areas. ooHe has [name] new area last week on the lateral aspect of his pressure ulcer more over the posterior trochanter. The original pressure ulcer looks quite stable has healthy granulation. 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 Almost fully epithelialized on the original superior area using Hydrofera Blue ooDTI on the left heel has peeled off this week to reveal [name] small superficial wound under denuded skin and subcutaneous tissue ooBoth areas between the first and second toes look better including nothing open on the  left 02/21/18; ooThe patient's wounds on his left ischial tuberosity and posterior left greater trochanter actually looked better. He has [name] large area of irritation around the area which I think is contact dermatitis. I am doubtful that this is fungal ooHis original wound on the left lateral calf continues to improve we have been using Hydrofera Blue ooThere is no open area in the left first second toe web space although there is [name] lot of thick callus ooThe DTI on the left heel required debridement today of necrotic surface eschar and subcutaneous tissue using silver alginate ooFinally the area on the right first second toe webspace continues to contract using silver alginate and ketoconazole 02/28/18 ooLeft ischial tuberosity wounds look better using silver alginate. ooOriginal wound on the left calf only has one small open area left using Hydrofera Blue ooDTI on the left heel required debridement mostly removing skin from around this wound surface. Using silver alginate ooThe areas on the right first/second toe web space using silver alginate and ketoconazole 03/08/18 on evaluation today patient appears to be doing decently well as best I can tell in regard to his wounds. This is the first time that I have seen him as he generally is followed by Dr. Dellia Nims. With that being said none of his wounds appear to be infected he does have an area where there is some skin covering what appears to be [name] new wound on the left dorsal surface of his great toe. This is right at the nail bed. With that being said I do believe that debrided away some of the excess skin can be of benefit in this regard. Otherwise he has been tolerating the dressing changes without complication. 03/14/18; patient arrives today with the multiplicity of wounds that we are following. He has not been systemically unwell ooOriginal wound on the left lateral calf now only has 2 small open areas we've been using Hydrofera Blue which  should continue ooThe deep tissue injury on the left heel requires debridement today. We've been using silver alginate Burel, Glyndon [name] ([id number]) [id number] Page 24 of 37 ooThe left first second toe and the right first second toe are both are reminiscence what I think was tinea pedis. Apparently some of the callus Surface between the toes was removed last week when it started draining. ooPurulent drainage coming from the wound on the ischial tuberosity on the left. 03/21/18-He is here in follow-up evaluation for multiple wounds. 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 He tolerated debridement. The only change to last week's recommendations is to discontinue antifungal cream between toes. He will follow-up next week 03/28/18; following up for multiple wounds;Concern this week is streaking redness and swelling in the right foot. He is going to need antibiotics for this. 03/31/18; follow-up for right foot cellulitis. Streaking redness and swelling in the right foot on 03/28/18. 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 I put him on clindamycin 300 mg every 6 and brought him in for [name] quick check. He has an open wound between his first and second toes on the right foot as [name] potential source. 04/04/18; ooRight foot cellulitis  is resolving he is completing clindamycin. This is truly good news ooLeft lateral calf wound which is initial wound only has one small open area inferiorly this is close to healing out. He has compression stockings. We will use Hydrofera Blue right down to the epithelialization of this ooNonviable surface on the left heel which was initially pressure with [name] DTI. We've been using Hydrofera Blue. I'm going to switch this back to silver alginate ooLeft first second toe/tinea pedis this looks better using silver alginate ooRight first second toe tinea pedis  using silver alginate ooLarge pressure ulcers on theLeft ischial tuberosity. Small wound here Looks better. I am uncertain about the surface over the large wound. Using silver alginate 04/11/18; ooCellulitis in the right foot is resolved ooLeft lateral calf wound which was his original wounds still has 2 tiny open areas remaining this is just about closed ooNonviable surface on the left heel is better but still requires debridement ooLeft first second toe/tinea pedis still open using silver alginate ooRight first second toe wound tinea pedis I asked him to go back to using ketoconazole and silver alginate ooLarge pressure ulcers on the left ischial tuberosity this shear injury here is resolved. Wound is smaller. No evidence of infection using silver alginate 04/18/18; ooPatient arrives with an intense area of cellulitis in the right mid lower calf extending into the right heel area. Bright red and warm. Smaller area on the left anterior leg. He has [name] significant history of MRSA. He will definitely need antibioticsoodoxycycline ooHe now has 2 open areas on the left ischial tuberosity the original large wound and now [name] satellite area which I think was above his initial satellite areas. Not [name] wonderful surface on this satellite area surrounding erythema which looks like pressure related. ooHis left lateral calf wound again his original wound is just about closed ooLeft heel pressure injury still requiring debridement ooLeft first second toe looks [name] lot better using silver alginate ooRight first second toe also using silver alginate and ketoconazole cream also looks better 04/20/18; the patient was worked in early today out of concerns with his cellulitis on the right leg. I had started him on doxycycline. This was 2 days ago. His wife was concerned about the swelling in the area. Also concerned about the left buttock. He has not been systemically unwell no fever chills. No  nausea vomiting or diarrhea 04/25/18; the patient's left buttock wound is continued to deteriorate he is using Hydrofera Blue. He is still completing clindamycin for the cellulitis on the right leg although all of this looks better. 05/02/18 ooLeft buttock wound still with [name] lot of drainage and [name] very tightly adherent fibrinous necrotic surface. He has [name] deeper area superiorly ooThe left lateral calf wound is still closed ooDTI wound on the left heel necrotic surface especially the circumference using Iodoflex ooAreas between his left first second toe and right first second toe both look better. Dorsally and the right first second toe he had [name] necrotic surface although at smaller. In using silver alginate and ketoconazole. I did [name] culture last week which was [name] deep tissue culture of the reminiscence of the open wound on the right first second toe dorsally. This grew [name] few Acinetobacter and [name] few methicillin-resistant staph aureus. Nevertheless the area actually this week looked better. I didn't feel the need to specifically address this at least in terms of systemic antibiotics. 05/09/18; wounds are measuring larger more drainage per our intake. We are using Santyl covered with alginate  on the large superficial buttock wounds, Iodosorb on the left heel, ketoconazole and silver alginate to the dorsal first and second toes bilaterally. 05/16/18; ooThe area on his left buttock better in some aspects although the area superiorly over the ischial tuberosity required an extensive debridement.using Santyl ooLeft heel appears stable. Using Iodoflex ooThe areas between his first and second toes are not bad however there is spreading erythema up the dorsal aspect of his left foot this looks like cellulitis again. He is insensate the erythema is really very brilliant.o Erysipelas He went to see an allergist days ago because he was itching part of this he had lab work done. This showed [name] white count of  15.1 with 70% neutrophils. Hemoglobin of 11.4 and [name] platelet count of 659,000. Last white count we had in Epic was [name] 2-1/2 years ago which was 25.9 but he was ill at the time. He was able to show me some lab work that was done by his primary physician the pattern is about the same. 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 But prompted me to go ahead and do x-rays of both feet and the pelvis rule out osteomyelitis. He also had [name] comprehensive metabolic panel this was reasonably normal his albumin was 3.7 liver function tests BUN/creatinine all normal 05/23/18; x-rays of both his feet from last week were negative for underlying pulmonary abnormality. The x-ray of his pelvis however showed mild irregularity in the left ischial which may represent some early osteomyelitis. The wound in the left ischial continues to get deeper clearly now exposed muscle. Each week necrotic surface material over this area. Whereas the rest of the wounds do not look so bad. ooThe left ischial wound we have been using Santyl and calcium alginate ooT the left heel surface necrotic debris using Iodoflex o ooThe left lateral leg is still healed ooAreas on the left dorsal foot and the right dorsal foot are about the same. There is some inflammation on the left which might represent contact dermatitis, fungal dermatitis I am doubtful cellulitis although this looks better than last week 05/30/18; CT scan done at Hospital did not show any osteomyelitis or abscess. Suggested the possibility of underlying cellulitis although I don't see [name] lot of evidence of this at the bedside ooThe wound itself on the left buttock/upper thigh actually looks somewhat better. No debridement ooLeft heel also looks better no debridement continue Iodoflex ooBoth dorsal first second toe spaces appear better using Lotrisone. Left still required debridement 06/06/18; ooIntake reported some purulent  looking drainage from the left gluteal wound. Using Santyl and calcium alginate ooLeft heel looks better although still [name] nonviable surface requiring debridement ooThe left dorsal foot first/second webspace actually expanding and somewhat deeper. I may consider doing [name] shave biopsy of this area ooRight dorsal foot first/second webspace appears stable to improved. Using Lotrisone and silver alginate to both these areas 06/13/18 ooLeft gluteal surface looks better. Now separated in the 2 wounds. No debridement required. Still drainage. We'll continue silver alginate ooLeft heel continues to look better with Iodoflex continue this for at least another week ooOf his dorsal foot wounds the area on the left still has some depth although it looks better than last week. We've been using Lotrisone and silver alginate 06/20/18 ooLeft gluteal continues to look better healthy tissue ooLeft heel continues to look better healthy granulation wound is smaller. He is using Iodoflex and his long as this continues continue the  Iodoflex ooDorsal right foot looks better unfortunately dorsal left foot does not. There is swelling and erythema of his forefoot. He had minor trauma to this several days ago but doesn't think this was enough to have caused any tissue injury. Foot looks like cellulitis, we have had this problem before 06/27/18 on evaluation today patient appears to be doing [name] little worse in regard to his foot ulcer. Unfortunately it does appear that he has methicillin-resistant Petrides, Linus [name] ([id number]) [id number] Page 25 of 37 staph aureus and unfortunately there really are no oral options for him as he's allergic to sulfa drugs as well as I box. Both of which would really be his only options for treating this infection. In the past he has been given and effusion of Orbactiv. This is done very well for him in the past again it's one time dosing IV antibiotic therapy.  Subsequently I do believe this is something we're gonna need to see about doing at this point in time. Currently his other wounds seem to be doing somewhat better in my pinion I'm pretty happy in that regard. 07/03/18 on evaluation today patient's wounds actually appear to be doing fairly well. He has been tolerating the dressing changes without complication. All in all he seems to be showing signs of improvement. In regard to the antibiotics he has been dealing with infectious disease since I saw him last week as far as getting this scheduled. In the end [facility]'s [facility] this coming Friday. In the meantime he has been continuing to perform the dressing changes in such as previous. There does not appear to be any evidence of infection worsengin at this time. 07/10/18; ooSince I last saw this man 2 weeks ago things have actually improved. IV antibiotics of resulted in less forefoot erythema although there is still some present. He is not systemically unwell ooLeft buttock wounds o2 now have no depth there is increased epithelialization Using silver alginate ooLeft heel still requires debridement using Iodoflex ooLeft dorsal foot still with [name] sizable wound about the size of [name] border but healthy granulation ooRight dorsal foot still with [name] slitlike area using silver alginate 07/18/18; the patient's cellulitis in the left foot is improved in fact I think it is on its way to resolving. ooLeft buttock wounds o2 both look better although the larger one has hypertension granulation we've been using silver alginate ooLeft heel has some thick circumferential redundant skin over the wound edge which will need to be removed today we've been using Iodoflex ooLeft dorsal foot is still [name] sizable wound required debridement using silver alginate ooThe right dorsal foot is just about closed only [name] small open area remains here 07/25/18; left foot  cellulitis is resolved ooLeft buttock wounds o2 both look better. Hyper-granulation on the major area ooLeft heel as some debris over the surface but otherwise looks [name] healthier wound. Using silver collagen ooRight dorsal foot is just about closed 07/31/18; arrives with our intake nurse worried about purulent drainage from the buttock. We had hyper-granulation here last week ooHis buttock wounds o2 continue to look better ooLeft heel some debris over the surface but measuring smaller. ooRight dorsal foot unfortunately has openings between the toes ooLeft foot superficial wound looks less aggravated. 08/07/18 ooButtock wounds continue to look better although some of her granulation and the larger medial wound. silver alginate ooLeft heel continues to look [name] lot better.silver collagen ooLeft foot superficial wound  looks less stable. Requires debridement. He has [name] new wound superficial area on the foot on the lateral dorsal foot. ooRight foot looks better using silver alginate without Lotrisone 08/14/2018; patient was in the ER last week diagnosed with [name] UTI. He is now on Cefpodoxime and Macrodantin. ooButtock wounds continued to be smaller. Using silver alginate ooLeft heel continues to look better using silver collagen ooLeft foot superficial wound looks as though it is improving ooRight dorsal foot area is just about healed. 08/21/2018; patient is completed his antibiotics for his UTI. ooHe has 2 open areas on the buttocks. There is still not closed although the surface looks satisfactory. Using silver alginate ooLeft heel continues to improve using silver collagen ooThe bilateral dorsal foot areas which are at the base of his first and second toes/possible tinea pedis are actually stable on the left but worse on the right. The area on the left required debridement of necrotic surface. After debridement I obtained [name] specimen for PCR culture. ooThe right dorsal foot which is  been just about healed last week is now reopened 08/28/2018; culture done on the left dorsal foot showed coag negative staph both staph epidermidis and Lugdunensis. I think this is worthwhile initiating systemic treatment. I will use doxycycline given his long list of allergies. The area on the left heel slightly improved but still requiring debridement. ooThe large wound on the buttock is just about closed whereas the smaller one is larger. Using silver alginate in this area 09/04/2018; patient is completing his doxycycline for the left foot although this continues to be [name] very difficult wound area with very adherent necrotic debris. We are using silver alginate to all his wounds right foot left foot and the small wounds on his buttock, silver collagen on the left heel. 09/11/2018; once again this patient has intense erythema and swelling of the left forefoot. Lesser degrees of erythema in the right foot. He has [name] long list of allergies and intolerances. I will reinstitute doxycycline. 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 Using silver alginate ooLeft heel also looks better using silver collagen ooUnfortunately both the areas on his feet look worse. The area on the left first second webspace is now gone through to the plantar part of his foot. The area on the left foot anteriorly is irritated with erythema and swelling in the forefoot. 09/25/2018 ooHis wound on the left plantar heel looks better. Using silver collagen ooThe area on the left buttock 2 small remnant areas. One is closed one is still open. Using silver alginate ooThe areas between both his first and second toes look worse. This in spite of long-standing antifungal therapy with ketoconazole and silver alginate which should have antifungal activity ooHe has small areas around his original wound on the left calf one is on the bottom of the original scar tissue and one  superiorly both of these are small and superficial but again given wound history in this site this is worrisome 10/02/2018 ooLeft plantar heel continues to gradually contract using silver collagen ooLeft buttock wound is unchanged using silver alginate ooThe areas on his dorsal feet between his first and second toes bilaterally look about the same. 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 Small open area here. We have been using silver alginate  10/09/2018; ooLeft plantar heel continues to look healthy and contract. No debridement is required ooLeft buttock slightly smaller with [name] tape injury wound just below which was new this week ooDorsal feet somewhat improved I have been using clindamycin ooLeft lateral looks lower extremity the actual open area looks worse although [name] lot of this is epithelialized. I am going to change to silver collagen today He has [name] lot more swelling in the right leg although this is not pitting not red and not particularly warm there is [name] lot of spasm in the right leg usually indicative of people with paralysis of some underlying discomfort. We have reviewed his vascular status from 2017 he had [name] left greater saphenous vein ablation. I wonder about referring him back to vascular surgery if the area on the left leg continues to deteriorate. 10/16/2018 in today for follow-up and management of multiple lower extremity ulcers. His left Buttock wound is much lower smaller and almost closed completely. The wound to the left ankle has began to reopen with Epithelialization and some adherent slough. He has multiple new areas to the left foot and leg. The left dorsal foot without much improvement. Wound present between left great webspace and 2nd toe. Erythema and edema present right leg. Right  LE ultrasound obtained on 10/10/18 was negative for DVT . 10/23/2018; ooLeft buttock is closed over. Still dry macerated skin but there is no open wound. I suspect this is chronic pressure/moisture ooLeft lateral calf is quite [name] bit worse than when I saw this last. There is clearly drainage here he has macerated skin into the left plantar heel. We will change Spreen, Gevon [name] ([id number]) [id number] Page 26 of 37 the primary dressing to alginate ooLeft dorsal foot has some improvement in overall wound area. Still using clindamycin and silver alginate ooRight dorsal foot about the same as the left using clindamycin and silver alginate ooThe erythema in the right leg has resolved. He is DVT rule out was negative ooLeft heel pressure area required debridement although the wound is smaller and the surface is health 10/26/2018 ooThe patient came back in for his nurse check today predominantly because of the drainage coming out of the left lateral leg with [name] recent reopening of his original wound on the left lateral calf. He comes in today with [name] large amount of surrounding erythema around the wound extending from the calf into the ankle and even in the area on the dorsal foot. He is not systemically unwell. He is not febrile. Nevertheless this looks like cellulitis. We have been using silver alginate to the area. I changed him to [name] regular visit and I am going to prescribe him doxycycline. The rationale here is [name] long list of medication intolerances and [name] history of MRSA. I did not see anything that I thought would provide [name] valuable culture 10/30/2018 ooFollow-up from his appointment 4 days ago with really an extensive area of cellulitis in the left calf left lateral ankle and left dorsal foot. I put him on doxycycline. He has [name] long list of medication allergies which are true allergy reactions. 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 In any case he is [name] lot better today. The erythema especially in the anterior and lateral left calf is better. He still has left ankle erythema. He also is complaining about increasing edema in the right leg we have only been using Kerlix Coban and he has been doing  the wraps at home. Finally he has [name] spotty rash on the medial part of his upper left calf which looks like folliculitis or perhaps wrap occlusion type injury. Small superficial macules not pustules 11/06/18 patient arrives today with again [name] considerable degree of erythema around the wound on the left lateral calf extending into the dorsal ankle and dorsal foot. This is [name] lot worse than when I saw this last week. He is on doxycycline really with not [name] lot of improvement. He has not been systemically unwell Wounds on the; left heel actually looks improved. Original area on the left foot and proximity to the first and second toes looks about the same. He has superficial areas on the dorsal foot, anterior calf and then the reopening of his original wound on the left lateral calf which looks about the same ooThe only area he has on the right is the dorsal webspace first and second which is smaller. ooHe has [name] large area of dry erythematous skin on the left buttock small open area here. 11/13/2018; the patient arrives in much better condition. The erythema around the wound on the left lateral calf is [name] lot better. Not sure whether this was the clindamycin or the TCA and ketoconazole or just in the improvement in edema control [stasis dermatitis]. In any case this is [name] lot better. The area on the left heel is very small and just about resolved using silver collagen we have been using silver alginate to the areas on his dorsal feet 11/20/2018; his wounds include the left lateral calf, left heel, dorsal aspects of both feet just proximal to the first second webspace. He is stable to  slightly improved. I did not think any changes to his dressings were going to be necessary 11/27/2018 he has [name] reopening on the left buttock which is surrounded by what looks like tinea or perhaps some other form of dermatitis. The area on the left dorsal foot has some erythema around it I have marked this area but I am not sure whether this is cellulitis or not. Left heel is not closed. Left calf the reopening is really slightly longer and probably worse 1/13; in general things look better and smaller except for the left dorsal foot. Area on the left heel is just about closed, left buttock looks better only [name] small wound remains in the skin looks better [using Lotrisone] 1/20; the area on the left heel only has [name] few remaining open areas here. Left lateral calf about the same in terms of size, left dorsal foot slightly larger right lateral foot still not closed. The area on the left buttock has no open wound and the surrounding skin looks [name] lot better 1/27; the area on the left heel is closed. Left lateral calf better but still requiring extensive debridements. The area on his left buttock is closed. He still has the open areas on the left dorsal foot which is slightly smaller in the right foot which is slightly expanded. We have been using Iodoflex on these areas as well 2/3; left heel is closed. Left lateral calf still requiring debridement using Iodoflex there is no open area on his left buttock however he has dry scaly skin over [name] large area of this. Not really responding well to the Lotrisone. Finally the areas on his dorsal feet at the level of the first second webspace are slightly smaller on the right and about the same on the left. Both of these vigorously debrided with Anasept and  gauze 2/10; left heel remains closed he has dry erythematous skin over the left buttock but there is no open wound here. Left lateral leg has come in and with. Still requiring debridement we have been using Iodoflex  here. Finally the area on the left dorsal foot and right dorsal foot are really about the same extremely dry callused fissured areas. He does not yet have [name] dermatology appointment 2/17; left heel remains closed. He has [name] new open area on the left buttock. The area on the left lateral calf is bigger longer and still covered in necrotic debris. No major change in his foot areas bilaterally. I am awaiting for [name] dermatologist to look on this. We have been using ketoconazole I do not know that this is been doing any good at all. 2/24; left heel remains closed. The left buttock wound that was new reopening last week looks better. The left lateral calf appears better also although still requires debridement. The major area on his foot is the left first second also requiring debridement. We have been putting Prisma on all wounds. I do not believe that the ketoconazole has done too much good for his feet. He will use Lotrisone I am going to give him [name] 2-week course of terbinafine. We still do not have [name] dermatology appointment 3/2 left heel remains closed however there is skin over bone in this area I pointed this out to him today. The left buttock wound is epithelialized but still does not look completely stable. The area on the left leg required debridement were using silver collagen here. With regards to his feet we changed to Lotrisone last week and silver alginate. 3/9; left heel remains closed. Left buttock remains closed. The area on the right foot is essentially closed. The left foot remains unchanged. Slightly smaller on the left lateral calf. Using silver collagen to both of these areas 3/16-Left heel remains closed. Area on right foot is closed. Left lateral calf above the lateral malleolus open wound requiring debridement with easy bleeding. Left dorsal wound proximal to first toe also debrided. Left ischial area open new. Patient has been using Prisma with wrapping every 3 days.  Dermatology appointment is apparently tomorrow.Patient has completed his terbinafine 2-week course with some apparent improvement according to him, there is still flaking and dry skin in his foot on the left 3/23; area on the right foot is reopened. The area on the left anterior foot is about the same still [name] very necrotic adherent surface. He still has the area on the left leg and reopening is on the left buttock. He apparently saw dermatology although I do not have [name] note. According to the patient who is usually fairly well informed they did not have any good ideas. Put him on oral terbinafine which she is been on before. 3/30; using silver collagen to all wounds. Apparently his dermatologist put him on doxycycline and rifampin presumably some culture grew staph. I do not have this result. He remains on terbinafine although I have used terbinafine on him before 4/6; patient has had [name] fairly substantial reopening on the right foot between the first and second toes. He is finished his terbinafine and I believe is on doxycycline and rifampin still as prescribed by dermatology. We have been using silver collagen to all his wounds although the patient reports that he thinks silver alginate does better on the wounds on his buttock. 4/13; the area on his left lateral calf about the same size but it  did not require debridement. ooLeft dorsal foot just proximal to the webspace between the first and second toes is about the same. Still nonviable surface. I note some superficial bronze discoloration of the dorsal part of his foot ooRight dorsal foot just proximal to the first and second toes also looks about the same. I still think there may be the same discoloration I noted above on the left ooLeft buttock wound looks about the same 4/20; left lateral calf appears to be gradually contracting using silver collagen. ooHe remains on erythromycin empiric treatment for possible erythrasma involving his  digital spaces. The left dorsal foot wound is debrided of tightly adherent necrotic debris and really cleans up quite nicely. The right area is worse with expansion. I did not debride this it is now over the base of the second toe ooThe area on his left buttock is smaller no debridement is required using silver collagen 5/4; left calf continues to make good progress. ooHe arrives with erythema around the wounds on his dorsal foot which even extends to the plantar aspect. Very concerning for coexistent infection. He is finished the erythromycin I gave him for possible erythrasma this does not seem to have helped. ooThe area on the left foot is about the same base of the dorsal toes ooIs area on the buttock looks improved on the left 5/11; left calf and left buttock continued to make good progress. Left foot is about the same to slightly improved. ooMajor problem is on the right foot. He has not had an x-ray. Deep tissue culture I did last week showed both Enterobacter and [name]. coli. I did not change the doxycycline I put him on empirically although neither 1 of these were plated to doxycycline. He arrives today with the erythema looking worse on both the dorsal and plantar foot. Macerated skin on the bottom of the foot. he has not been systemically unwell Flinn, Kelso [name] ([id number]) [id number] Page 27 of 81 5/18-Patient returns at 1 week, left calf wound appears to be making some progress, left buttock wound appears slightly worse than last time, left foot wound looks slightly better, right foot redness is marginally better. X-ray of both feet show no air or evidence of osteomyelitis. Patient is finished his Omnicef and terbinafine. He continues to have macerated skin on the bottom of the left foot as well as right 5/26; left calf wound is better, left buttock wound appears to have multiple small superficial open areas with surrounding macerated skin. X-rays that I did  last time showed no evidence of osteomyelitis in either foot. He is finished cefdinir and doxycycline. I do not think that he was on terbinafine. He continues to have [name] large superficial open area on the right foot anterior dorsal and slightly between the first and second toes. I did send him to dermatology 2 months ago or so wondering about whether they would do [name] fungal scraping. I do not believe they did but did do [name] culture. We have been using silver alginate to the toe areas, he has been using antifungals at home topically either ketoconazole or Lotrisone. We are using silver collagen on the left foot, silver alginate on the right, silver collagen on the left lateral leg and silver alginate on the left buttock 6/1; left buttock area is healed. We have the left dorsal foot, left lateral leg and right dorsal foot. We are using silver alginate to the areas on both feet and silver collagen to the area on his left lateral  calf 6/8; the left buttock apparently reopened late last week. He is not really sure how this happened. He is tolerating the terbinafine. Using silver alginate to all wounds 6/15; left buttock wound is larger than last week but still superficial. ooCame in the clinic today with [name] report of purulence from the left lateral leg I did not identify any infection ooBoth areas on his dorsal feet appear to be better. He is tolerating the terbinafine. Using silver alginate to all wounds 6/22; left buttock is about the same this week, left calf quite [name] bit better. His left foot is about the same however he comes in with erythema and warmth in the right forefoot once again. Culture that I gave him in the beginning of May showed Enterobacter and [name]. coli. I gave him doxycycline and things seem to improve although neither 1 of these organisms was specifically plated. 6/29; left buttock is larger and dry this week. Left lateral calf looks to me to be improved. Left dorsal foot also somewhat  improved right foot completely unchanged. The erythema on the right foot is still present. He is completing the Ceftin dinner that I gave him empirically [see discussion above.) 7/6 - All wounds look to be stable and perhaps improved, the left buttock wound is slightly smaller, per patient bleeds easily, completed ceftin, the right foot redness is less, he is on terbinafine 7/13; left buttock wound about the same perhaps slightly narrower. Area on the left lateral leg continues to narrow. Left dorsal foot slightly smaller right foot about the same. We are using silver alginate on the right foot and Hydrofera Blue to the areas on the left. Unna boot on the left 2 layer compression on the right 7/20; left buttock wound absolutely the same. Area on lateral leg continues to get better. Left dorsal foot require debridement as did the right no major change in the 7/27; left buttock wound the same size necrotic debris over the surface. The area on the lateral leg is closed once again. His left foot looks better right foot about the same although there is some involvement now of the posterior first second toe area. He is still on terbinafine which I have given him for [name] month, not certain [name] centimeter major change 06/25/19-All wounds appear to be slightly improved according to report, left buttock wound looks clean, both foot wounds have minimal to no debris the right dorsal foot has minimal slough. We are using Hydrofera Blue to the left and silver alginate to the right foot and ischial wound. 8/10-Wounds all appear to be around the same, the right forefoot distal part has some redness which was not there before, however the wound looks clean and small. Ischial wound looks about the same with no changes 8/17; his wound on the left lateral calf which was his original chronic venous insufficiency wound remains closed. Since I last saw him the areas on the left dorsal foot right dorsal foot generally appear  better but require debridement. The area on his left initial tuberosity appears somewhat larger to me perhaps hyper granulated and bleeds very easily. We have been using Hydrofera Blue to the left dorsal foot and silver alginate to everything else 8/24; left lateral calf remains closed. The areas on his dorsal feet on the webspace of the first and second toes bilaterally both look better. The area on the left buttock which is the pressure ulcer stage II slightly smaller. I change the dressing to Hydrofera Blue to all areas  8/31; left lateral calf remains closed. The area on his dorsal feet bilaterally look better. Using Hydrofera Blue. Still requiring debridement on the left foot. No change in the left buttock pressure ulcers however 9/14; left lateral calf remains closed. Dorsal feet look quite [name] bit better than 2 weeks ago. Flaking dry skin also [name] lot better with the ammonium lactate I gave him 2 weeks ago. The area on the left buttock is improved. He states that his Roho cushion developed [name] leak and he is getting [name] new one, in the interim he is offloading this vigorously 9/21; left calf remains closed. Left heel which was [name] possible DTI looks better this week. He had macerated tissue around the left dorsal foot right foot looks satisfactory and improved left buttock wound. I changed his dressings to his feet to silver alginate bilaterally. Continuing Hydrofera Blue on the left buttock. 9/28 left calf remains closed. Left heel did not develop anything [possible DTI] dry flaking skin on the left dorsal foot. Right foot looks satisfactory. Improved left buttock wound. We are using silver alginate on his feet Hydrofera Blue on the buttock. I have asked him to go back to the Lotrisone on his feet including the wounds and surrounding areas 10/5; left calf remains closed. The areas on the left and right feet about the same. [name] lot of this is epithelialized however debris over the remaining open areas. He  is using Lotrisone and silver alginate. The area on the left buttock using Hydrofera Blue 10/26. Patient has been out for 3 weeks secondary to Covid concerns. He tested negative but I think his wife tested positive. He comes in today with the left foot substantially worse, right foot about the same. Even more concerning he states that the area on his left buttock closed over but then reopened and is considerably deeper in one aspect than it was before [stage III wound] 11/2; left foot really about the same as last week. Quarter sized wound on the dorsal foot just proximal to the first second toes. Surrounding erythema with areas of denuded epithelium. This is not really much different looking. Did not look like cellulitis this time however. ooRight foot area about the same.. We have been using silver alginate alginate on his toes ooLeft buttock still substantial irritated skin around the wound which I think looks somewhat better. We have been using Hydrofera Blue here. 11/9; left foot larger than last week and [name] very necrotic surface. Right foot I think is about the same perhaps slightly smaller. Debris around the circumference also addressed. Unfortunately on the left buttock there is been [name] decline. Satellite lesions below the major wound distally and now [name] an additional one posteriorly we have been using Hydrofera Blue but I think this is [name] pressure issue 11/16; left foot ulcer dorsally again [name] very adherent necrotic surface. Right foot is about the same. Not much change in the pressure ulcer on his left buttock. 11/30; left foot ulcer dorsally basically the same as when I saw him 2 weeks ago. Very adherent fibrinous debris on the wound surface. Patient reports [name] lot of drainage as well. 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 We have been using Iodoflex, patient changed back to alginate because of the drainage. Area on his right dorsal foot  really looks benign with [name] healthier surface certainly [name] lot better than on the left. Left buttock wounds all improved using Hydrofera Blue 12/7; left dorsal foot  again no improvement. Tightly adherent debris. PCR culture I did last week only showed likely skin contaminant. I have gone ahead and done [name] punch biopsy of this which is about the last thing in terms of investigations I can think to do. He has known venous insufficiency and venous hypertension and this could be the issue here. The area on the right foot is about the same left buttock slightly worse according to our intake nurse secondary to [facility] Blue sticking to the wound 12/14; biopsy of the left foot that I did last time showed changes that could be related to wound healing/chronic stasis dermatitis phenomenon no neoplasm. We have been using silver alginate to both feet. I change the one on the left today to Sorbact and silver alginate to his other 2 wounds 12/28; the patient arrives with the following problems; ooMajor issue is the dorsal left foot which continues to be [name] larger deeper wound area. Still with [name] completely nonviable surface ooParadoxically the area mirror image on the right on the right dorsal foot appears to be getting better. ooHe had some loss of dry denuded skin from the lower part of his original wound on the left lateral calf. Some of this area looked [name] little vulnerable and for this reason we put him in wrap that on this side this week ooThe area on his left buttock is larger. He still has the erythematous circular area which I think is [name] combination of pressure, sweat. This does not look like cellulitis or fungal dermatitis 11/26/2019; -Dorsal left foot large open wound with depth. Still debris over the surface. Using Sorbact ooThe area on the dorsal right foot paradoxically has closed over [facility] has [name] reopening on the left ankle laterally at the base of his original wound that extended up into the calf.  This appears clean. ooThe left buttock wound is smaller but with very adherent necrotic debris over the surface. We have been using silver alginate here as well The patient had arterial studies done in 2017. He had biphasic waveforms at the dorsalis pedis and posterior tibial bilaterally. ABI in the left was 1.17. Digit Doerr, Yoniel [name] ([id number]) [id number] Page 28 of 37 waveforms were dampened. He has slight spasticity in the great toes I do not think [name] TBI would be possible 1/11; the patient comes in today with [name] sizable reopening between the first and second toes on the right. This is not exactly in the same location where we have been treating wounds previously. According to our intake nurse this was actually fairly deep but 0.6 cm. The area on the left dorsal foot looks about the same the surface is somewhat cleaner using Sorbact, his MRI is in 2 days. We have not managed yet to get arterial studies. The new reopening on the left lateral calf looks somewhat better using alginate. The left buttock wound is about the same using alginate 1/18; the patient had his ARTERIAL studies which were quite normal. ABI in the right at 1.13 with triphasic/biphasic waveforms on the left ABI 1.06 again with triphasic/biphasic waveforms. It would not have been possible to have done [name] toe brachial index because of spasticity. We have been using Sorbac to the left foot alginate to the rest of his wounds on the right foot left lateral calf and left buttock 1/25; arrives in clinic with erythema and swelling of the left forefoot worse over the first MTP area. This extends laterally dorsally and but also posteriorly. Still has an area on  the left lateral part of the lower part of his calf wound it is eschared and clearly not closed. ooArea on the left buttock still with surrounding irritation and erythema. ooRight foot surface wound dorsally. The area between the right and first and second  toes appears better. 2/1; ooThe left foot wound is about the same. Erythema slightly better I gave him [name] week of doxycycline empirically ooRight foot wound is more extensive extending between the toes to the plantar surface ooLeft lateral calf really no open surface on the inferior part of his original wound however the entire area still looks vulnerable ooAbsolutely no improvement in the left buttock wound required debridement. 2/8; the left foot is about the same. Erythema is slightly improved I gave him clindamycin last week. ooRight foot looks better he is using Lotrimin and silver alginate ooHe has [name] breakdown in the left lateral calf. Denuded epithelium which I have removed ooLeft buttock about the same were using Hydrofera Blue 2/15; left foot is about the same there is less surrounding erythema. Surface still has tightly adherent debris which I have debriding however not making any progress ooRight foot has [name] substantial wound on the medial right second toe between the first and second webspace. ooStill an open area on the left lateral calf distal area. ooButtock wound is about the same 2/22; left foot is about the same less surrounding erythema. Surface has adherent debris. Polymen Ag Right foot area significant wound between the first and second toes. We have been using silver alginate here Left lateral leg polymen Ag at the base of his original venous insufficiency wound ooLeft buttock some improvement here 3/1; ooRight foot is deteriorating in the first second toe webspace. Larger and more substantial. We have been using silver alginate. ooLeft dorsal foot about the same markedly adherent surface debris using PolyMem Ag ooLeft lateral calf surface debris using PolyMem AG ooLeft buttock is improved again using PolyMem Ag. ooHe is completing his terbinafine. The erythema in the foot seems better. He has been on this for 2 weeks 3/8; no improvement in any wound area  in fact he has [name] small open area on the dorsal midfoot which is new this week. He has not gotten his foot x-rays yet 3/15; his x-rays were both negative for osteomyelitis of both feet. No major change in any of his wounds on the extremities however his buttock wounds are better. We have been using polymen on the buttocks, left lower leg. Iodoflex on the left foot and silver alginate on the right 3/22; arrives in clinic today with the 2 major issues are the improvement in the left dorsal foot wound which for once actually looks healthy with [name] nice healthy wound surface without debridement. Using Iodoflex here. Unfortunately on the left lateral calf which is in the distal part of his original wound he came to the clinic here for there was purulent drainage noted some increased breakdown scattered around the original area and [name] small area proximally. We we are using polymen here will change to silver alginate today. His buttock wound on the left is better and I think the area on the right first second toe webspace is also improved 3/29; left dorsal foot looks better. Using Iodoflex. Left ankle culture from deterioration last time grew [name]. coli, Enterobacter and Enterococcus. I will give him [name] course of cefdinir although that will not cover Enterococcus. The area on the right foot in the webspace of the first and second toe lateral first toe  looks better. The area on his buttock is about healed Vascular appointment is on April 21. This is to look at his venous system vis--vis continued breakdown of the wounds on the left including the left lateral leg and left dorsal foot he. He has had previous ablations on this side 4/5; the area between the right first and second toes lateral aspect of the first toe looks better. Dorsal aspect of the left first toe on the left foot also improved. Unfortunately the left lateral lower leg is larger and there is [name] second satellite wound superiorly. The usual superficial  abrasions on the left buttock overall better but certainly not closed 4/12; the area between the right first and second toes is improved. Dorsal aspect of the left foot also slightly smaller with [name] vibrant healthy looking surface. No real change in the left lateral leg and the left buttock wound is healed He has an unaffordable co-pay for Apligraf. Appointment with vein and vascular with regards to the left leg venous part of the circulation is on 4/21 4/19; we continue to see improvement in all wound areas. Although this is minor. He has his vascular appointment on 4/21. The area on the left buttock has not reopened although right in the center of this area the skin looks somewhat threatened 4/26; the left buttock is unfortunately reopened. In general his left dorsal foot has [name] healthy surface and looks somewhat smaller although it was not measured as such. The area between his first and second toe webspace on the right as [name] small wound against the first toe. The patient saw vascular surgery. The real question I was asking was about the small saphenous vein on the left. He has previously ablated left greater saphenous vein. Nothing further was commented on on the left. Right greater saphenous vein without reflux at the saphenofemoral junction or proximal thigh there was no indication for ablation of the right greater saphenous vein duplex was negative for DVT bilaterally. They did not think there was anything from [name] vascular surgery point of view that could be offered. They ABIs within normal limits 5/3; only small open area on the left buttock. The area on the left lateral leg which was his original venous reflux is now 2 wounds both which look clean. We are using Iodoflex on the left dorsal foot which looks healthy and smaller. He is down to [name] very tiny area between the right first and second toes, using silver alginate 5/10; all of his wounds appear better. We have much better edema control in 4  layer compression on the left. This may be the factor that is allowing the left foot and left lateral calf to heal. He has external compression garments at home 04/14/20-All of his wounds are progressing well, the left forefoot is practically closed, left ischium appears to be about the same, right toe webspace is also smaller. The left lateral leg is about the same, continue using Hydrofera Blue to this, silver alginate to the ischium, Iodoflex to the toe space on the right 6/7; most of his wounds outside of the left buttock are doing well. The area on the left lateral calf and left dorsal foot are smaller. The area on the right foot in between the first and second toe webspace is barely visible although he still says there is some drainage here is the only reason I did not heal this out. ooUnfortunately the area on the left buttock almost looks like he has [name] skin tear  from tape. He has open wound and then [name] large flap of skin that we are trying to get adherence over an area just next to the remaining wound 6/21; 2 week follow-up. I believe is been here for nurse visits. Miraculously the area between his first and second toes on the left dorsal foot is closed over. Still open on the right first second web space. The left lateral calf has 2 open areas. Distally this is more superficial. The proximal area had [name] little more depth and required debridement of adherent necrotic material. His buttock wound is actually larger we have been using silver alginate here 6/28; the patient's area on the left foot remains closed. Still open wet area between the first and second toes on the right and also extending into the plantar aspect. We have been using silver alginate in this location. He has 2 areas on the left lower leg part of his original long wounds which I think are better. We have been using Hydrofera Blue here. Hydrofera Blue to the left buttock which is stable 7/12; left foot remains closed. Left ankle  is closed. May be [name] small area between his right first and second toes the only truly open area is on the left buttock. We have been using Hydrofera Blue here 7/19; patient arrives with marked deterioration especially in the left foot and ankle. We did not put him in [name] compression wrap on the left last week in fact he wore his juxta lite stockings on either side although he does not have an underlying stocking. He has [name] reopening on the left dorsal foot, left lateral ankle and [name] new area on the right dorsal ankle. More worrisome is the degree of erythema on the left foot extending on the lateral foot into the lateral lower leg on the left 7/26; the patient had erythema and drainage from the lateral left ankle last week. Culture of this grew MRSA resistant to doxycycline and clindamycin which are the 2 antibiotics we usually use with this patient who has multiple antibiotic allergies including linezolid, trimethoprim sulfamethoxazole. I had give him an Botkin, Christpher [name] ([id number]) [id number] Page 29 of 37 empiric doxycycline and he comes in the area certainly looks somewhat better although it is blotchy in his lower leg. He has not been systemically unwell. He has had areas on the left dorsal foot which is [name] reopening, chronic wounds on the left lateral ankle. Both of these I think are secondary to chronic venous insufficiency. The area between his first and second toes is closed as far as I can tell. He had [name] new wrap injury on the right dorsal ankle last week. Finally he has an area on the left buttock. We have been using silver alginate to everything except the left buttock we are using Hydrofera Blue 06/30/20-Patient returns at 1 week, has been given [name] sample dose pack of NUZYRA which is [name] tetracycline derivative [omadacycline], patient has completed those, we have been using silver alginate to almost all the wounds except the left ischium where we are using Hydrofera Blue all  of them look better 8/16; since I last saw the patient he has been doing well. The area on the left buttock, left lateral ankle and left foot are all closed today. He has completed the [place] I gave him last time and tolerated this well. He still has open areas on the right dorsal ankle and in the right first second toe area which we are using silver alginate.  8/23; we put him in his bilateral external compression stockings last week as he did not have anything open on either leg except for concerning area between the right first and second toe. He comes in today with an area on the left dorsal foot slightly more proximal than the original wound, the left lateral foot but this is actually [name] continuation of the area he had on the left lateral ankle from last time. As well he is opened up on the left buttock again. 8/30; comes in today with things looking [name] lot better. The area on the left lower ankle has closed down as has the left foot but with eschar in both areas. The area on the dorsal right ankle is also epithelialized. Very little remaining of the left buttock wound. We have been using silver alginate on all wound areas 9/13; the area in the first second toe webspace on the right has fully epithelialized. He still has some vulnerable epithelium on the right and the ankle and the dorsal foot. He notes weeping. He is using his juxta lite stocking. On the left again the left dorsal foot is closed left lateral ankle is closed. We went to the juxta lite stocking here as well. ooStill vulnerable in the left buttock although only 2 small open areas remain here 9/27; 2-week follow-up. We did not look at his left leg but the patient says everything is closed. He is [name] bit disturbed by the amount of edema in his left foot he is using juxta lite stockings but asking about over the toes stockings which would be 30/40, will talk to him next time. According to him there is no open wound on either the left foot  or the left ankle/calf He has an open area on the dorsal right calf which I initially point [name] wrap injury. He has superficial remaining wound on the left ischial tuberosity been using silver alginate although he says this sticks to the wound 10/5; we gave him 2-week follow-up but he called yesterday expressing some concerns about his right foot right ankle and the left buttock. He came in early. There is still no open areas on the left leg and that still in his juxta lite stocking 10/11; he only has 1 small area on the left buttock that remains measuring millimeters 1 mm. Still has the same irritated skin in this area. We recommended zinc oxide when this eventually closes and pressure relief is meticulously is he can do this. He still has an area on the dorsal part of his right first through third toes which is [name] bit irritated and still open and on the dorsal ankle near the crease of the ankle. We have been using silver alginate and using his own stocking. He has nothing open on the left leg or foot 10/25; 2-week follow-up. Not nearly as good on the left buttock as I was hoping. For open areas with 5 looking threatened small. He has the erythematous irritated chronic skin in this area. oo1 area on the right dorsal ankle. He reports this area bleeds easily ooRight dorsal foot just proximal to the base of his toes ooWe have been using silver alginate. 11/8; 2-week follow-up. Left buttock is about the same although I do not think the wounds are in the same location we have been using silver alginate. I have asked him to use zinc oxide on the skin around the wounds. ooHe still has [name] small area on the right dorsal ankle he reports this  bleeds easily ooRight dorsal foot just proximal to the base of the toes does not have anything open although the skin is very dry and scaly ooHe has [name] new opening on the nailbed of the left great toe. Nothing on the left ankle 11/29; 3-week follow-up. Left buttock  has 2 open areas. And washing of these wounds today started bleeding easily. Suggesting very friable tissue. We have been using silver alginate. Right dorsal ankle which I thought was initially [name] wrap injury we have been using silver alginate. Nothing open between the toes that I can see. He states the area on the left dorsal toe nailbed healed after the last visit in 2 or 3 days 12/13; 3-week follow-up. His left buttock now has 3 open areas but the original 2 areas are smaller using polymen here. Surrounding skin looks better. The right dorsal ankle is closed. He has [name] small opening on the right dorsal foot at the level of the third toe. In general the skin looks better here. He is wearing his juxta lite stocking on the left leg says there is nothing open 11/24/2020; 3 weeks follow-up. His left buttock still has the 3 open areas. We have been using polymen but due to lack of response he changed to [facility] area. Surrounding skin is dry erythematous and irritated looking. There is no evidence of infection either bacterial or fungal however there is loss of surface epithelium ooHe still has very dry skin in his foot causing irritation and erythema on the dorsal part of his toes. This is not responded to prolonged courses of antifungal simply looks dry and irritated 1/24; left buttock area still looks about the same he was unable to find the triad ointment that we had suggested. The area on the right lower leg just above the dorsal ankle has reopened and the areas on the right foot between the first second and second third toes and scaling on the bottom of the foot has been about the same for quite some time now. been using silver alginate to all wound areas 2/7; left buttock wound looked quite good although not much smaller in terms of surface area surrounding skin looks better. Only [name] few dry flaking areas on the right foot in between the first and second toes the skin generally looks better here  [ammonium lactate]. Finally the area on the right dorsal ankle is closed 2/21; ooThere is no open area on the right foot even between the right first and second toe. Skin around this area dorsally and plantar aspects look better. ooHe has [name] reopening of the area on the right ankle just above the crease of the ankle dorsally. I continue to think that this is probably friction from spasms may be even this time with his stocking under the compression stockings. ooWounds on his left buttock look about the same there [name] couple of areas that have reopened. He has [name] total square area of loss of epithelialization. This does not look like infection it looks like [name] contact dermatitis but I just cannot determine to what 3/14; there is nothing on the right foot between the first and second toes this was carefully inspected under illumination. Some chronic irritation on the dorsal part of his foot from toes 1-3 at the base. Nothing really open here substantially. Still has an area on the right foot/ankle that is actually larger and hyper granulated. His buttock area on the left is just about closed however he has chronic inflammation with loss  of the surface epithelial layer 3/28; 2-week follow-up. In clinic today with [name] new wound on the left anterior mid tibia. Says this happened about 2 weeks ago. He is not really sure how wonders about the spasticity of his legs at night whether that could have caused this other than that he does not have [name] good idea. He has been using topical antibiotics and silver alginate. The area on his right dorsal ankle seems somewhat better. ooFinally everything on his left buttock is closed. 4/11; 2-week follow-up. All of his wounds are better except for the area over the ischium and left buttock which have opened up widely again. At least part of this is covered in necrotic fibrinous material another part had rolled nonviable skin. The area on the right ankle, left anterior mid  tibia are both [name] lot better. He had no open wounds on either foot including the areas between the first and second toes 4/25; patient presents for 2-week follow-up. He states that the wounds are overall stable. He has no complaints today and states he is using Hydrofera Blue to open wounds. 5/9; have not seen this man in over [name] month. For my memory he has open areas on the left mid tibia and right ankle. T oday he has new open area on the right dorsal foot which we have not had [name] problem with recently. He has the sustained area on the left buttock He is also changed his insurance at the beginning of the year [employer]. We will need prior authorizations for debridement 5/23; patient presents for 2-week follow-up. He has prior authorizations for debridement. He denies any issues in the past 2 weeks with his wound care. He has been using Hydrofera Blue to all the wounds. He does report [name] circular rash to the upper left leg that is new. He denies acute signs of infection. 6/6; 2-week follow-up. The patient has open wounds on the left buttock which are worse than the last time I saw this about [name] month ago. He also has [name] new area to me on the left anterior mid tibia with some surrounding erythema. The area on the dorsal ankle on the right is closed but I think this will be [name] friction injury every time this area is exposed to either our wraps or his compression stockings caused by unrelenting spasms in this leg. 6/20; 2-week follow-up. oo The patient has open wounds on the left buttock which is about the same. Using [facility] here. - The left mid tibia has [name] static amount of surrounding erythema. Also [name] raised area in the center. We have been using Hydrofera Blue here. ooooFinally he has broken down in his dorsal right foot extending between the first and second toes and going to the base of the first and second toe webspace. I Burdette, [place] ([id number]) [id number]  Page 30 of 37 have previously assumed that this was severe venous hypertension 7/5; 2-week follow-up oo The left buttock wound actually looks better. We are using Hydrofera Blue. He has extensive skin irritation around this area and I have not really been able to get that any better. I have tried Lotrisone i.[name]. antifungals and steroids. More most recently we have just been using Coloplast really looks about the same. oo The left mid tibia which was new last week culture to have very resistant staph aureus. Not only methicillin-resistant but doxycycline resistant. The patient has [name] plethora of antibiotic allergies including sulfa, linezolid. I used topical bacitracin on  this but he has not started this yet. oo In addition he has an expanding area of erythema with [name] wound on the dorsal right foot. I did [name] deep tissue culture of this area today 7/12; oo Left buttock area actually looks better surrounding skin also looks less irritated. oo Left mid tibia looks about the same. He is using bacitracin this is not worse oo Right dorsal foot looks about the same as well. oo The left first toe also looks about the same 7/19; left buttock wound continues to improve in terms of open areas oo Left mid tibia is still concerning amount of swelling he is using bacitracin oo Dorsal left first toe somewhat smaller oo Right dorsal foot somewhat smaller 7/25; left buttock wound actually continues to improve oo Left mid tibia area has less swelling. I gave him all my samples of new Nuzyra. This seems to have helped although the wound is still open it. His abrasion closed by here oo Left dorsal great toe really no better. Still [name] very nonviable surface oo Right dorsal foot perhaps some better. oo We have been using bacitracin and silver nitrate to the areas on his lower legs and Hydrofera Blue to the area on the buttock. 8/16 oo Disappointed that his left buttock wound is actually more substantial.  Apparently during the last nurse visit these were both very small. He has continued irritation to [name] large area of skin on his buttock. I have never been able to totally explain this although I think it some combination of the way he sits, pressure, moisture. He is not incontinent enough to contribute to this. oo Left dorsal great toe still fibrinous debris on the surface that I have debrided today oo Large area across the dorsal right toes. oo The area on the left anterior mid tibia has less swelling. He completed the [place]. This does not look infected although the tissue is still fried 8/30; 2-week follow-up. oo Left buttock areas not improved. We used Hydrofera Blue on this. Weeping wet with the surrounding erythema that I have not been able to control even with Lotrisone and topical Coloplast oo Left dorsal great toe looks about the same oo More substantial area again at the base of his toes on the left which is new this week. oo Area across the dorsal right toes looks improved oo The left anterior mid tibia looks like it is trying to close 9/13; 2-week follow-up. Using silver alginate on all of his wounds. The left dorsal foot does not look any better. He has the area on the dorsal toe and also the areas at the base of all of his toes 1 through 3. On the right foot he has [name] similar pattern in [name] similar area. He has the area on his left mid tibia that looks fairly healthy. Finally the area on his left buttock looks somewhat bette 9/20; culture I did of the left foot which was [name] deep tissue culture last time showed [name]. coli he has erythema around this wound. Still [name] completely necrotic surface. His right dorsal foot looks about the same. He has [name] very friable surface to the left anterior mid tibia. Both buttock wounds look better. We have been using silver alginate to all wounds 10/4; he has completed the cephalexin that I gave him last time for the left foot. He is using topical  gentamicin under silver alginate silver alginate being applied to all the wounds. Unfortunately all the wounds look irritated on his  dorsal right foot dorsal left foot left mid tibia. I wonder if this could be [name] silver allergy. I am going to change him to [facility] on the lower extremity. The skin on the left buttock and left posterior thigh still flaking dry and irritated. This has continued no matter what I have applied topically to this. He has [name] solitary open wound which by itself does not look too bad however the entire area of surrounding skin does not change no matter what we have applied here 10/18; the area on the left dorsal foot and right dorsal foot both look better. The area on the right extends into the plantar but not between his toes. We have been using silver alginate. He still has [name] rectangular erythematous area around the area on the left tibia. The wound itself is very small. Finally everything on his left buttock looks [name] little larger the skin is erythematous 11/15; patient comes in with the left dorsal and right dorsal foot distally looking somewhat better. Still nonviable surface on the left foot which required debridement. He still has the area on the left anterior mid tibia although this looks somewhat better. He has [name] new area on the right lateral lower leg just above the ankle. Finally his left buttock looks terrible with multiple superficial open wounds geometric square shaped area of chronic erythema which I have not been able to sort through 11/29; right dorsal foot and left dorsal foot both look somewhat better. No debridement required. He has the fragile area on the left anterior mid tibia this looks and continues to look somewhat better. Right lateral lower leg just above the ankle we identified last time also looks better. In general the area on his left buttock looks improved. We are using Hydrofera Blue to all wound areas 12/13; right dorsal foot looks better.  The area on the right lateral leg is healed. Left dorsal foot has 2 open areas both of which require debridement. The fragile area on the left anterior mid tibial looks better. Smaller area on his buttocks. Were using Hydrofera Blue 1/10; patient comes in with everything looking slightly larger and/or worse. This includes his left buttock, reopening of the left mid tibia, larger areas on the left dorsal foot and what looks to be [name] cellulitis on the right dorsal and plantar foot. We have been using Hydrofera Blue on all wounds. 1/17; right dorsal foot distally looks better today. The left foot has 2 open wounds that are about the same surrounding erythema. 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 No malignancy they did not stain for fungus 1/24; his right distal foot is not closed dry and scaly but the wound looks like it is contracting. I did not debride anything here. Problem on his left dorsal foot with expanding erythema. Apparently there were problems last week getting the Elesa Hacker however it is now available at the [facility] but [name] week later. He is using ketoconazole and Coloplast to the left buttock along with Hydrofera Blue this actually looks quite [name] bit better today. 1/31; right dorsal foot again is dry and scaly but looking to contract. He has been using [name] moisturizer on his feet at my request but he is not sure which 1. The left dorsal foot wounds look about the same there is erythema here that I marked last week however after course of Nuzyra it  certainly is not any better but not any worse either. Finally on his left buttock the skin continues to look better he has the original wound but [name] new substantial area towards the gluteal cleft. Almost like [name] skin tear. I used scissors to remove skin and subcutaneous tissue here silver nitrate and direct  pressure 2/7; right dorsal foot. This does not look too much different from last week. Some erythema skin dry and scaly. No debridement. Left dorsal great toe again still not much improvement. I did remove flaking dry skin and callus from around the edge. Finally on his left buttock. The skin is somewhat better in the periwound. Surface wounds are superficial somewhat better than last week. 01/26/2022: Is [name] little bit of [name] mystery as to why his wounds fail to respond to treatments and actually seem to get worse. This is my first encounter with this patient. He was previously followed by Dr. Dellia Nims. Based upon my review of the chart, it seems that there is [name] little bit of [name] mystery as to why his wounds do not respond as anticipated to the interventions applied and sometimes even get worse. Biopsies have been performed and he was seen by dermatology in [place], but that did not shed any light on the matter. T oday, his gluteal wound is larger, with substantial drainage, rather malodorous. The food wounds are not terrible, but he has [name] lot of callus and scaly skin around these. He is currently getting silver alginate on the gluteal wound, with idodoflex to the feet. He is Mahoney, Arkin [name] ([id number]) [id number] Page 31 of 37 using lotrisone on his legs for the dry, scaly skin. 02/09/2022: There has really been no change to any of his wounds. The gluteal wound less drainage and odor, but remains about the same size, the periwound skin remains oddly scaly. His lower extremity wounds also appear roughly the same size. They continue to accumulate [name] small amount of slough. The periwound on his feet and ankle wounds has dry eschar and loose dead skin. We have been using silver alginate on the gluteal wound and Iodoflex on his feet and ankle wounds. T the periwound around his gluteal lesion and Lotrisone on his feet and legs. o 02/23/2022: The right plantar foot wound is closed. The  gluteal site looks small but has continued to produce hypertrophic granulation tissue. The foot wounds all look about the same on the dorsal surface of the right foot; on the left, there is only [name] small open area at the site of where his left great toenail would have been. 03/16/2022: The right ankle wound is healed. The right dorsal foot wound is about the same. The left dorsal foot wound is quite [name] bit smaller and the ischial wound is nearly closed. 03/30/2022: The right ankle wound reopened. Both dorsal foot wounds are quite [name] bit smaller. Unfortunately, he appears to have sheared part of his ischial wound open further, perhaps during [name] transfer. 04/13/2022: The right ankle wound has hypertrophic granulation tissue present. The dorsal foot wounds continue to decrease in size. The ischial wound looks about the same today, no better, no worse. 04/27/2022: The right ankle wound has closed. Unfortunately, it looks like some moisture got underneath the dry skin on both of his dorsal feet and these wounds have expanded in size. The ischial wound remains the same with perhaps [name] little bit more slough accumulation than at our previous visit. 05/11/2022: The right ankle wound remains closed. There is [name] left  anterior tibial wound that is small has patchy openings with accumulated slough. The dorsum of his right foot appears to be nearly healed with just [name] small punctate opening. The plantar surface of his right foot has [name] new opening that looks like he may have picked some skin there. His sacral ulcer has hypertrophic granulation tissue but has some slough accumulation. The dorsum of his left foot has multiple open areas in [name] fairly ragged distribution. All of these have slough accumulated within them. 06/01/2022: The right ankle and left anterior tibial wound are both closed. Dorsum of his right foot and left foot both look substantially better with just tiny scattered openings The without any slough accumulation.  He has sheared open new areas on his left gluteus and ischium. He says that his wheelchair cushion, which is air-filled, has [name] leak and so it keeps deflating. He is awaiting [name] new cushion. 06/15/2022: The right ankle wound has reopened and the fat layer is exposed. Both dorsal feet have just small openings with just [name] little bit of slough and eschar accumulation. The wound on his left gluteus and ischium is larger again today and has [name] foul odor. 06/29/2022: The right ankle wound has hypertrophic granulation tissue buildup. His dorsal foot wounds both look better with just some eschar on the surface. He has [name] new wound on his left lateral ankle. He is not sure how he acquired it but by appearance, it looks that he hit it on something, potentially his wheelchair or bed. The ischial wound is about the same but is cleaner without any significant purulent drainage or odor. He did not understand what the [facility] call was about and therefore he does not have the topical compounded antibiotic. 07/13/2022: The right ankle wound again has hypertrophic granulation tissue, but less so than at his previous visit. The ischial wound has improved tremendously the use of the [facility] topical antibiotic. No significant change to the left lateral ankle wound; it is fibrotic with slough present. The skin of both of his feet, especially on the right, has [name] yeasty appearance. 08/10/2022: There is again hypertrophic granulation tissue on the right anterior ankle wound. Both feet are about the same. The left lateral ankle wound is [name] little bit desiccated and has some slough buildup. He unfortunately suffered [name] new injury when he was removing his pants and they caught his bandage which caused [name] large skin tear on his left ischium, just distal to the existing wound. The existing left ischial wound, however, is significantly better with just [name] little light slough on the surface. 08/31/2022: The right anterior ankle wound is [name] lot  smaller today underneath some eschar. No accumulation of hypertrophic granulation tissue. The left lateral ankle wound has some slough on the surface but is better in terms of moisture balance this week. The left dorsal foot does not really have any openings on it today. The right dorsal foot has some slough and eschar accumulation. His gluteal ulcer is basically closed aside from 2 small areas that are oozing [name] bit. Patient History Information obtained from Patient. Family History Diabetes - Father, Hypertension - Mother, No family history of Cancer, Heart Disease, Hereditary Spherocytosis, Kidney Disease, Lung Disease, Stroke, Thyroid Problems, Tuberculosis. Social History Former smoker, Marital Status - Married, Alcohol Use - Moderate, Drug Use - No History, Caffeine Use - Daily. Medical History Ear/Nose/Mouth/Throat Denies history of Chronic sinus problems/congestion, Middle ear problems Hematologic/Lymphatic Denies history of Anemia, Hemophilia, Human Immunodeficiency Virus, Lymphedema, Sickle Cell  Disease Respiratory Patient has history of Sleep Apnea - does not tolerate cpap Denies history of Aspiration, Asthma, Chronic Obstructive Pulmonary Disease (COPD), Pneumothorax, Tuberculosis Cardiovascular Patient has history of Hypertension - lisinopril HCTZ Denies history of Angina, Arrhythmia, Congestive Heart Failure, Coronary Artery Disease, Deep Vein Thrombosis, Hypotension, Myocardial Infarction, Peripheral Arterial Disease, Peripheral Venous Disease, Phlebitis, Vasculitis Gastrointestinal Denies history of Cirrhosis , Colitis, Crohnoos, Hepatitis [name], Hepatitis B, Hepatitis C Endocrine Denies history of Type I Diabetes, Type II Diabetes Genitourinary Denies history of End Stage Renal Disease Immunological Denies history of Lupus Erythematosus, Raynaudoos, Scleroderma Integumentary (Skin) Denies history of History of Burn Musculoskeletal Denies history of Gout, Rheumatoid  Arthritis, Osteoarthritis, Osteomyelitis Neurologic Patient has history of Paraplegia Denies history of Dementia, Neuropathy, Quadriplegia, Seizure Disorder Oncologic Jurgensen, Hernandez [name] ([id number]) [id number] Page 32 of 37 Denies history of Received Chemotherapy, Received Radiation Psychiatric Denies history of Anorexia/bulimia, Confinement Anxiety Hospitalization/Surgery History - cellulitis in leg. - left leg vein ablation. Objective Constitutional Slightly tachycardic, asymptomatic. No acute distress.. Vitals Time Taken: 8:37 AM, Height: 70 in, Weight: 216 lbs, BMI: 31, Temperature: 98.4 F, Pulse: 107 bpm, Respiratory Rate: 16 breaths/min, Blood Pressure: 120/75 mmHg. Respiratory Normal work of breathing on room air.. General Notes: 08/31/2022: The right anterior ankle wound is [name] lot smaller today underneath some eschar. No accumulation of hypertrophic granulation tissue. The left lateral ankle wound has some slough on the surface but is better in terms of moisture balance this week. The left dorsal foot does not really have any openings on it today. The right dorsal foot has some slough and eschar accumulation. His gluteal ulcer is basically closed aside from 2 small areas that are oozing [name] bit. Integumentary (Hair, Skin) Wound #41R status is Open. Original cause of wound was Gradually Appeared. The date acquired was: 03/16/2020. The wound has been in treatment 128 weeks. The wound is located on the Left Ischium. The wound measures 1cm length x 1cm width x 0.1cm depth; 0.785cm^2 area and 0.079cm^3 volume. There is Fat Layer (Subcutaneous Tissue) exposed. There is no tunneling or undermining noted. There is [name] large amount of sanguinous drainage noted. The wound margin is distinct with the outline attached to the wound base. There is medium (34-66%) red, pink granulation within the wound bed. There is [name] medium (34-66%) amount of necrotic tissue within the wound  bed including Adherent Slough. The periwound skin appearance exhibited: Maceration. Wound #52 status is Open. Original cause of wound was Gradually Appeared. The date acquired was: 03/27/2021. The wound has been in treatment 74 weeks. The wound is located on the Right,Dorsal Foot. The wound measures 1cm length x 1.2cm width x 0.1cm depth; 0.942cm^2 area and 0.094cm^3 volume. There is Fat Layer (Subcutaneous Tissue) exposed. There is no tunneling or undermining noted. There is [name] none present amount of drainage noted. The wound margin is flat and intact. There is small (1-33%) red granulation within the wound bed. There is [name] large (67-100%) amount of necrotic tissue within the wound bed including Eschar. The periwound skin appearance exhibited: Dry/Scaly. Wound #56 status is Open. Original cause of wound was Gradually Appeared. The date acquired was: 07/11/2021. The wound has been in treatment 58 weeks. The wound is located on the Left,Dorsal Foot. The wound measures 1.8cm length x 1cm width x 0.1cm depth; 1.414cm^2 area and 0.141cm^3 volume. There is Fat Layer (Subcutaneous Tissue) exposed. There is no tunneling or undermining noted. There is [name] none present amount of drainage noted. The wound margin is flat  and intact. There is medium (34-66%) red, pink granulation within the wound bed. There is [name] medium (34-66%) amount of necrotic tissue within the wound bed including Eschar. The periwound skin appearance exhibited: Dry/Scaly. Wound #65 status is Open. Original cause of wound was Pressure Injury. The date acquired was: 05/24/2022. The wound has been in treatment 13 weeks. The wound is located on the Left Upper Leg. The wound measures 1cm length x 1cm width x 0.1cm depth; 0.785cm^2 area and 0.079cm^3 volume. There is Fat Layer (Subcutaneous Tissue) exposed. There is no tunneling or undermining noted. There is [name] medium amount of sanguinous drainage noted. The wound margin is flat and intact. There is large  (67-100%) red, pink granulation within the wound bed. There is [name] small (1-33%) amount of necrotic tissue within the wound bed including Adherent Slough. The periwound skin appearance exhibited: Maceration. Wound #66 status is Open. Original cause of wound was Pressure Injury. The date acquired was: 06/15/2022. The wound has been in treatment 11 weeks. The wound is located on the Right,Anterior Ankle. The wound measures 1.7cm length x 0.6cm width x 0.1cm depth; 0.801cm^2 area and 0.08cm^3 volume. There is Fat Layer (Subcutaneous Tissue) exposed. There is no tunneling or undermining noted. There is [name] medium amount of serosanguineous drainage noted. The wound margin is flat and intact. There is medium (34-66%) red, friable granulation within the wound bed. There is [name] medium (34-66%) amount of necrotic tissue within the wound bed including Eschar. The periwound skin appearance exhibited: Dry/Scaly. The periwound skin appearance did not exhibit: Maceration. Wound #67 status is Open. Original cause of wound was Gradually Appeared. The date acquired was: 06/22/2022. The wound has been in treatment 9 weeks. The wound is located on the Left,Lateral Lower Leg. The wound measures 4cm length x 1.8cm width x 0.1cm depth; 5.655cm^2 area and 0.565cm^3 volume. There is no tunneling or undermining noted. There is [name] none present amount of drainage noted. There is large (67-100%) granulation within the wound bed. There is no necrotic tissue within the wound bed. The periwound skin appearance exhibited: Dry/Scaly. Wound #68 status is Healed - Epithelialized. Original cause of wound was Trauma. The date acquired was: 08/08/2022. The wound has been in treatment 3 weeks. The wound is located on the Left,Medial Upper Leg. The wound measures 0cm length x 0cm width x 0cm depth; 0cm^2 area and 0cm^3 volume. There is [name] medium amount of sanguinous drainage noted. 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 There was [name] Excisional Skin/Subcutaneous Tissue Debridement with [name] total area of 1.2 sq cm performed by Fredirick Maudlin, MD. With the following instrument(s): Curette to remove Viable and Non-Viable tissue/material. Material removed includes Eschar, Subcutaneous Tissue, and Slough. No specimens were taken. [name] time out was conducted at 09:07, prior to the start of the procedure. [name] Moderate amount of bleeding was controlled with Pressure. The procedure was  tolerated well with [name] pain level of 0 throughout and [name] pain level of 0 following the procedure. Post Debridement Measurements: 1cm length x 1.2cm width x 0.1cm depth; 0.094cm^3 volume. Character of Wound/Ulcer Post Debridement is improved. Post procedure Diagnosis Wound #52: Same as Pre-Procedure General Notes: Scribed for Dr. Celine Ahr by Blanche East, RN. Wound #66 Pre-procedure diagnosis of Wound #66 is [name] Venous Leg Ulcer located on the Right,Anterior Ankle .Severity of Tissue Pre Debridement is: Fat layer exposed. There was [name] Excisional Skin/Subcutaneous Tissue Debridement with [name] total area of 7.2 sq cm performed by Fredirick Maudlin, MD. With the following instrument(s): Curette to remove Viable and Non-Viable tissue/material. Material removed includes  Subcutaneous Tissue and Slough and. No specimens were taken. [name] time out was conducted at 09:07, prior to the start of the procedure. [name] Moderate amount of bleeding was controlled with Silver Nitrate. The procedure was tolerated well with [name] pain level of 0 throughout. Post Debridement Measurements: 4cm length x 1.8cm width x 0.1cm depth; 0.565cm^3 volume. Character of Wound/Ulcer Post Debridement is improved. Severity of Tissue Post Debridement is: Fat layer exposed. Post procedure Diagnosis Wound #66: Same as Pre-Procedure General Notes: Scribed for Dr. Celine Ahr by Blanche East, RN. 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 There was [name] Excisional Skin/Subcutaneous Tissue Debridement with [name] total area of 7.2 sq cm performed by Fredirick Maudlin, MD. With the following instrument(s): Curette to remove Viable and Non-Viable tissue/material. Material removed includes Subcutaneous Tissue and Slough and. No specimens were taken. [name] time out was conducted at 09:07, prior to the start of the procedure. [name] Moderate amount of bleeding was controlled with Silver Nitrate. The procedure was tolerated well with [name] pain level of 0 throughout. Post Debridement Measurements: 4cm length x 1.8cm width x 0.1cm depth; 0.565cm^3 volume. Character of Wound/Ulcer Post Debridement is improved. Severity of Tissue Post Debridement is: Fat layer exposed. Post procedure Diagnosis Wound #67: Same as Pre-Procedure General Notes: Scribed for Dr. Celine Ahr by Blanche East, RN. Wound #41R Pre-procedure diagnosis of Wound #41R is [name] Pressure Ulcer located on the Left Ischium . An Chemical Cauterization procedure was performed by Fredirick Maudlin, MD. Post procedure Diagnosis Wound #41R: Same as Pre-Procedure Notes: Scribed for Dr. Celine Ahr by Blanche East, RN Plan Follow-up Appointments: Return appointment in 3 weeks. -  Dr Celine Ahr - Room 1 Bathing/ Shower/ Hygiene: May shower and wash wound with soap and water. - on days that dressing is changed Edema Control - Lymphedema / SCD / Other: Elevate legs to the level of the heart or above for 30 minutes daily and/or when sitting, [name] frequency of: - throughout the day Moisturize legs daily. - using Aquaphor generously to both legs and feet with dressing changes Compression stocking or Garment 30-40 mm/Hg pressure to: - Juxtalite to both legs daily Off-Loading: Roho cushion for wheelchair Turn and reposition every 2 hours WOUND #41R: - Ischium Wound Laterality: Left Cleanser: Soap and Water Every Other Day/30 Days Discharge Instructions: May shower and wash wound with dial antibacterial soap and water prior to dressing change. Peri-Wound Care: Zinc Oxide Ointment 30g tube Every Other Day/30 Days Discharge Instructions: Apply Zinc Oxide to periwound with each dressing change Topical: [place] antibiotic compound Every Other Day/30 Days Discharge Instructions: thin layer to wound bed, use daily once available Prim Dressing: KerraCel Ag Gelling Fiber Dressing, 4x5 in (silver alginate) (Generic) Every Other Day/30  Days ary Discharge Instructions: Apply silver alginate to wound bed as instructed Secondary Dressing: ALLEVYN Gentle Border, 4x4 (in/in) Every Other Day/30 Days Discharge Instructions: Apply over primary dressing as directed. WOUND #52: - Foot Wound Laterality: Dorsal, Right Cleanser: Soap and Water Every Other Day/30 Days Discharge Instructions: May shower and wash wound with dial antibacterial soap and water prior to dressing change. Peri-Wound Care: Triamcinolone 15 (g) Every Other Day/30 Days Discharge Instructions: Use triamcinolone 15 (g) as directed Peri-Wound Care: Sween Lotion (Moisturizing lotion) Every Other Day/30 Days Discharge Instructions: Apply Aquaphor moisturizing lotion as directed Peri-Wound Care: Lotrisone Every Other Day/30  Days Discharge Instructions: or antifungal spray, Apply Lotrisone to periwound Prim Dressing: KerraCel Ag Gelling Fiber Dressing, 2x2 in (silver alginate) Every Other Day/30 Days ary Discharge Instructions: Apply silver alginate to wound bed as instructed Secondary Dressing: Woven Gauze Sponge, Non-Sterile 4x4 in Every Other Day/30 Days Discharge Instructions: Apply over primary dressing as directed. Secured With: [employer], 4.5x3.1 (in/yd) (Generic) Every Other Day/30 Days Discharge Instructions: Secure with Kerlix as directed. Secured With: 2M Medipore H Soft Cloth Surgical T ape, 4 x 10 (in/yd) (Generic) Every Other Day/30 Days Discharge Instructions: Secure with tape as directed. Com pression Stockings: Circaid Juxta Lite Compression Wrap [name], Eino [name] ([id number]) [id number] Page 34 of 37 Compression Amount: 30-40 mmHg (right) Discharge Instructions: Apply Circaid Juxta Lite Compression Wrap daily as instructed. Apply first thing in the morning, remove at night before bed. WOUND #56: - Foot Wound Laterality: Dorsal, Left Cleanser: Soap and Water Every Other Day/30 Days Discharge Instructions: May shower and wash wound with dial antibacterial soap and water prior to dressing change. Peri-Wound Care: Triamcinolone 15 (g) Every Other Day/30 Days Discharge Instructions: Use triamcinolone 15 (g) as directed Peri-Wound Care: Sween Lotion (Moisturizing lotion) Every Other Day/30 Days Discharge Instructions: Apply Aquaphor moisturizing lotion as directed Peri-Wound Care: Lotrisone Every Other Day/30 Days Discharge Instructions: or antifungal spray, Apply Lotrisone to periwound Prim Dressing: KerraCel Ag Gelling Fiber Dressing, 2x2 in (silver alginate) Every Other Day/30 Days ary Discharge Instructions: Apply silver alginate to wound bed as instructed Secondary Dressing: Woven Gauze Sponge, Non-Sterile 4x4 in Every Other Day/30 Days Discharge  Instructions: Apply over primary dressing as directed. Secured With: [employer], 4.5x3.1 (in/yd) (Generic) Every Other Day/30 Days Discharge Instructions: Secure with Kerlix as directed. Secured With: 2M Medipore H Soft Cloth Surgical T ape, 4 x 10 (in/yd) (Generic) Every Other Day/30 Days Discharge Instructions: Secure with tape as directed. WOUND #65: - Upper Leg Wound Laterality: Left Cleanser: Soap and Water Every Other Day/30 Days Discharge Instructions: May shower and wash wound with dial antibacterial soap and water prior to dressing change. Peri-Wound Care: Zinc Oxide Ointment 30g tube Every Other Day/30 Days Discharge Instructions: Apply Zinc Oxide to periwound with each dressing change Topical: [place] antibiotic compound Every Other Day/30 Days Discharge Instructions: thin layer to wound bed, use daily once available Prim Dressing: KerraCel Ag Gelling Fiber Dressing, 4x5 in (silver alginate) (Generic) Every Other Day/30 Days ary Discharge Instructions: Apply silver alginate to wound bed as instructed Secondary Dressing: ALLEVYN Gentle Border, 4x4 (in/in) Every Other Day/30 Days Discharge Instructions: Apply over primary dressing as directed. WOUND #66: - Ankle Wound Laterality: Right, Anterior Cleanser: Soap and Water Every Other Day/30 Days Discharge Instructions: May shower and wash wound with dial antibacterial soap and water prior to dressing change. Peri-Wound Care: Triamcinolone 15 (g) Every Other Day/30 Days Discharge Instructions: Use triamcinolone 15 (g) as directed Peri-Wound Care: Sween  Lotion (Moisturizing lotion) Every Other Day/30 Days Discharge Instructions: Apply Aquaphor moisturizing lotion as directed Peri-Wound Care: Lotrisone Every Other Day/30 Days Discharge Instructions: Apply Lotrisone to periwound Prim Dressing: KerraCel Ag Gelling Fiber Dressing, 2x2 in (silver alginate) Every Other Day/30 Days ary Discharge Instructions: Apply silver alginate  to wound bed as instructed Secondary Dressing: Woven Gauze Sponge, Non-Sterile 4x4 in Every Other Day/30 Days Discharge Instructions: Apply over primary dressing as directed. Secured With: [employer], 4.5x3.1 (in/yd) (Generic) Every Other Day/30 Days Discharge Instructions: Secure with Kerlix as directed. Secured With: 51M Medipore H Soft Cloth Surgical T ape, 4 x 10 (in/yd) (Generic) Every Other Day/30 Days Discharge Instructions: Secure with tape as directed. WOUND #67: - Lower Leg Wound Laterality: Left, Lateral Cleanser: Soap and Water Every Other Day/30 Days Discharge Instructions: May shower and wash wound with dial antibacterial soap and water prior to dressing change. Peri-Wound Care: Triamcinolone 15 (g) Every Other Day/30 Days Discharge Instructions: Use triamcinolone 15 (g) as directed Peri-Wound Care: Sween Lotion (Moisturizing lotion) Every Other Day/30 Days Discharge Instructions: Apply Aquaphor moisturizing lotion as directed Peri-Wound Care: Lotrisone Every Other Day/30 Days Discharge Instructions: Apply Lotrisone to periwound Prim Dressing: Promogran Prisma Matrix, 4.34 (sq in) (silver collagen) Every Other Day/30 Days ary Discharge Instructions: Moisten collagen with saline or hydrogel Secondary Dressing: Woven Gauze Sponge, Non-Sterile 4x4 in Every Other Day/30 Days Discharge Instructions: Apply over primary dressing as directed. Secured With: [employer], 4.5x3.1 (in/yd) (Generic) Every Other Day/30 Days Discharge Instructions: Secure with Kerlix as directed. Secured With: 51M Medipore H Soft Cloth Surgical T ape, 4 x 10 (in/yd) (Generic) Every Other Day/30 Days Discharge Instructions: Secure with tape as directed. 08/31/2022: The right anterior ankle wound is [name] lot smaller today underneath some eschar. No accumulation of hypertrophic granulation tissue. The left lateral ankle wound has some slough on the surface but is better in terms of moisture balance  this week. The left dorsal foot does not really have any openings on it today. The right dorsal foot has some slough and eschar accumulation. His gluteal ulcer is basically closed aside from 2 small areas that are oozing [name] bit. I used [name] curette to debride eschar and slough from the right anterior ankle wound, eschar and slough from the right dorsal foot wound. Slough and nonviable subcutaneous tissue from the left ankle wound. I used silver nitrate to chemically cauterize the oozing areas on his gluteal ulcer. I think we can discontinue using the [facility] topical antibiotic for now. Continue silver alginate to all sites. He may benefit from skin substitute application so we will run his insurance for TheraSkin to treat the left lateral ankle wound. Follow-up in 2 weeks' time. Electronic Signature(s) Signed: 08/31/2022 12:34:12 PM By: Fredirick Maudlin MD FACS Signed: 09/03/2022 5:15:19 PM By: Blanche East RN Previous Signature: 08/31/2022 9:25:57 AM Version By: Fredirick Maudlin MD FACS Entered By: Blanche East on 08/31/2022 12:21:16 Apostol, [name] ([id number]) [id number] Page 35 of 37 -------------------------------------------------------------------------------- HxROS Details Patient Name: Date of Service: [place], [name] 08/31/2022 8:15 [name] Medical Record Number: [id number] Patient Account Number: [id number] Date of Birth/Sex: Treating RN: 1986-06-06 (36 y.o. M) Primary Care Provider: [place], [place] Other Clinician: Referring Provider: Treating Provider/Extender: Cornelia Copa [name]: [phone] Information Obtained From Patient Ear/Nose/Mouth/Throat Medical History: Negative for: Chronic sinus problems/congestion; Middle ear problems Hematologic/Lymphatic Medical History: Negative for: Anemia; Hemophilia; Human Immunodeficiency Virus; Lymphedema; Sickle Cell Disease Respiratory Medical History: Positive for: Sleep Apnea - does  not  tolerate cpap Negative for: Aspiration; Asthma; Chronic Obstructive Pulmonary Disease (COPD); Pneumothorax; Tuberculosis Cardiovascular Medical History: Positive for: Hypertension - lisinopril HCTZ Negative for: Angina; Arrhythmia; Congestive Heart Failure; Coronary Artery Disease; Deep Vein Thrombosis; Hypotension; Myocardial Infarction; Peripheral Arterial Disease; Peripheral Venous Disease; Phlebitis; Vasculitis Gastrointestinal Medical History: Negative for: Cirrhosis ; Colitis; Crohns; Hepatitis [name]; Hepatitis B; Hepatitis C Endocrine Medical History: Negative for: Type I Diabetes; Type II Diabetes Genitourinary Medical History: Negative for: End Stage Renal Disease Immunological Medical History: Negative for: Lupus Erythematosus; Raynauds; Scleroderma Integumentary (Skin) Medical History: Negative for: History of Burn Musculoskeletal Medical History: Negative for: Gout; Rheumatoid Arthritis; Osteoarthritis; Osteomyelitis Neurologic Medical History: Positive for: Paraplegia Negative for: Dementia; Neuropathy; Quadriplegia; Seizure Disorder Julia, Kimmy [name] ([id number]) [id number] Page 36 of 37 Oncologic Medical History: Negative for: Received Chemotherapy; Received Radiation Psychiatric Medical History: Negative for: Anorexia/bulimia; Confinement Anxiety Immunizations Pneumococcal Vaccine: Received Pneumococcal Vaccination: No Immunization Notes: doesn't remember when last tetanus shot was Implantable Devices No devices added Hospitalization / Surgery History Type of Hospitalization/Surgery cellulitis in leg left leg vein ablation Family and Social History Cancer: No; Diabetes: Yes - Father; Heart Disease: No; Hereditary Spherocytosis: No; Hypertension: Yes - Mother; Kidney Disease: No; Lung Disease: No; Stroke: No; Thyroid Problems: No; Tuberculosis: No; Former smoker; Marital Status - Married; Alcohol Use: Moderate; Drug Use: No History;  Caffeine Use: Daily; Financial Concerns: No; Food, Clothing or Shelter Needs: No; Support System Lacking: No; Transportation Concerns: No Electronic Signature(s) Signed: 08/31/2022 9:27:14 AM By: Fredirick Maudlin MD FACS Entered By: Fredirick Maudlin on 08/31/2022 09:23:12 -------------------------------------------------------------------------------- SuperBill Details Patient Name: Date of Service: [place], [name] 08/31/2022 Medical Record Number: [id number] Patient Account Number: [id number] Date of Birth/Sex: Treating RN: 20-Jul-1986 (36 y.o. 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, 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 Non-pressure chronic ulcer of other part of left foot limited to breakdown 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 Facility Procedures : [place], [place] Code: [id number] EX [name] ([id number]) Description: 11042 - DEB SUBQ TISSUE 20 SQ CM/< ICD-10 Diagnosis Description 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 L97.518 Non-pressure chronic  ulcer of other part of right foot with other specified sev [phone] Modifier: [email or web address] [email or web address] Quantity: 1 27.pdf Page 37 of 37 Physician Procedures : CPT4 Code Description Modifier [id number] - WC PHYS LEVEL 3 - EST PT  25 ICD-10 Diagnosis Description 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, 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 L97.322 Non-pressure  chronic ulcer of left 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 L97.511 Non-pressure chronic ulcer of other part of right foot limited to breakdown of skin Quantity: 1 : [id number] 11042 - WC PHYS SUBQ TISS 20 SQ CM ICD-10 Diagnosis Description 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

## 2022-09-03 NOTE — Progress Notes (Signed)
Ng, [name] ([id number]) [id number] Page 1 of 17 Visit Report for 08/31/2022 Arrival Information Details Patient Name: Date of Service: [name] 08/31/2022 8:15 [name] Medical Record Number: [id number] Patient Account Number: [id number] Date of Birth/Sex: Treating RN: [birth year] (36 y.o. Waldron Session Primary Care Tamala Manzer: [place], [place] Other Clinician: Referring Keyon Liller: Treating Yeimy Brabant/Extender: Cornelia Copa [name]: 15 Visit Information History Since Last Visit All ordered tests and consults were completed: Yes Patient Arrived: Wheel Chair Added or deleted any medications: No Arrival Time: 08:36 Any new allergies or adverse reactions: No Accompanied By: self Had [name] fall or experienced change in No Transfer Assistance: EasyPivot Patient Lift activities of daily living that may affect Patient Requires Transmission-Based Precautions: No risk of falls: Patient Has Alerts: Yes Signs or symptoms of abuse/neglect since last visito No Patient Alerts: R ABI = 1.0 Hospitalized since last visit: No L ABI = 1.1 Implantable device outside of the clinic excluding No cellular tissue based products placed in the center since last visit: Has Compression in Place as Prescribed: Yes Pain Present Now: No Electronic Signature(s) Signed: 09/03/2022 5:15:19 PM By: Blanche East RN Entered By: Blanche East on 08/31/2022 08:36:58 -------------------------------------------------------------------------------- Encounter Discharge Information Details Patient Name: Date of Service: [name] 08/31/2022 8:15 [name] Medical Record Number: [id number] Patient Account Number: [id number] Date of Birth/Sex: Treating RN: 08/21/1986 (36 y.o. Waldron Session Primary Care Victorine Mcnee: [place], [place] Other Clinician: Referring Surabhi Gadea: Treating Sarthak Rubenstein/Extender: Cornelia Copa [name]: [phone] Encounter Discharge  Information Items Post Procedure Vitals Discharge Condition: Stable Temperature (F): 98.4 Ambulatory Status: Wheelchair Pulse (bpm): 107 Discharge Destination: Home Respiratory Rate (breaths/min): 16 Transportation: Private Auto Blood Pressure (mmHg): 120/75 Accompanied By: self Schedule Follow-up Appointment: Yes Clinical Summary of Care: Electronic Signature(s) Signed: 09/03/2022 5:15:19 PM By: Blanche East RN Entered By: Blanche East on 08/31/2022 09:23:58 [name] ([id number]) [id number] Page 2 of 17 -------------------------------------------------------------------------------- Lower Extremity Assessment Details Patient Name: Date of Service: [name] 08/31/2022 8:15 [name] Medical Record Number: [id number] Patient Account Number: [id number] Date of Birth/Sex: Treating RN: [birth year] (36 y.o. Waldron Session Primary Care Austina Constantin: [place], [place] Other Clinician: Referring Irfan Veal: Treating Sharea Guinther/Extender: Graciela Husbands, [name] [name]: 347 Edema Assessment Assessed: [Left: No] [Right: No] Edema: [Left: Yes] [Right: Yes] Calf Left: Right: Point of Measurement: 33 cm From Medial Instep 30 cm 36 cm Ankle Left: Right: Point of Measurement: 10 cm From Medial Instep 25 cm 24.4 cm Vascular Assessment Pulses: Dorsalis Pedis Palpable: [Left:Yes] [Right:Yes] Electronic Signature(s) Signed: 09/03/2022 5:15:19 PM By: Blanche East RN Entered By: Blanche East on 08/31/2022 08:40:56 -------------------------------------------------------------------------------- Multi Wound Chart Details Patient Name: Date of Service: [name] 08/31/2022 8:15 [name] Medical Record Number: [id number] Patient Account Number: [id number] Date of Birth/Sex: Treating RN: Jan 07, 1986 (36 y.o. M) Primary Care Auri Jahnke: [place], [place] Other Clinician: Referring Deetta Siegmann: Treating Keshun Berrett/Extender: Graciela Husbands, [name] Weeks  in Treatment: 427 [41R:Photos:] Left Ischium Right, Dorsal Foot Left, Dorsal Foot Wound Location: Gradually Appeared Gradually Appeared Gradually Appeared Wounding Event: Pressure Ulcer Trauma, Other Neuropathic Ulcer-Non Diabetic Primary Etiology: Sleep Apnea, Hypertension, Paraplegia Sleep Apnea, Hypertension, Paraplegia Sleep Apnea, Hypertension, Paraplegia Comorbid History: 03/16/2020 03/27/2021 07/11/2021 Date Acquired: 128 74 58 Weeks of Treatment: Open Open Open Wound Status: [name], [name] ([id number]) [id number] Page 3 of 17 Yes No No Wound Recurrence: Yes No Yes Clustered Wound: 3 [name] [name] Clustered Quantity: 1x1x0.1 1x1.2x0.1 1.8x1x0.1 Measurements L x  W x D (cm) 0.785 0.942 1.414 [name] (cm) : rea 0.079 0.094 0.141 Volume (cm) : 56.50% 50.00% 77.30% % Reduction in Area: 56.40% 50.00% 77.30% % Reduction in Volume: Category/Stage II Full Thickness Without Exposed Full Thickness Without Exposed Classification: Support Structures Support Structures Large None Present None Present Exudate [name] mount: Sanguinous [name] [name] Exudate Type: red [name] [name] Exudate Color: Distinct, outline attached Flat and Intact Flat and Intact Wound Margin: Medium (34-66%) Small (1-33%) Medium (34-66%) Granulation [name] mount: Red, Pink Red Red, Pink Granulation Quality: Medium (34-66%) Large (67-100%) Medium (34-66%) Necrotic [name] mount: Adherent Slough Eschar Eschar Necrotic Tissue: Fat Layer (Subcutaneous Tissue): Yes Fat Layer (Subcutaneous Tissue): Yes Fat Layer (Subcutaneous Tissue): Yes Exposed Structures: Fascia: No Fascia: No Fascia: No Tendon: No Tendon: No Tendon: No Muscle: No Muscle: No Muscle: No Joint: No Joint: No Joint: No Bone: No Bone: No Bone: No Medium (34-66%) Medium (34-66%) Large (67-100%) Epithelialization: [name] Debridement - Excisional [name] Debridement: Pre-procedure Verification/Time Out [name] 09:07 [name] Taken: [name] Necrotic/Eschar,  Subcutaneous, [name] Tissue Debrided: Slough [name] Skin/Subcutaneous Tissue [name] Level: [name] 1.2 [name] Debridement [name] (sq cm): rea [name] Curette [name] Instrument: [name] Moderate [name] Bleeding: [name] Pressure [name] Hemostasis Achieved: [name] 0 [name] Procedural Pain: [name] Procedure was tolerated well [name] Debridement Treatment Response: [name] 1x1.2x0.1 [name] Post Debridement Measurements L x W x D (cm) [name] 0.094 [name] Post Debridement Volume: (cm) Maceration: Yes Dry/Scaly: Yes Dry/Scaly: Yes Periwound Skin Moisture: [name] [name] [name] Procedures Performed: Wound Number: [id number] Photos: Left Upper Leg Right, Anterior Ankle Left, Lateral Lower Leg Wound Location: Pressure Injury Pressure Injury Gradually Appeared Wounding Event: Pressure Ulcer Venous Leg Ulcer Venous Leg Ulcer Primary Etiology: Sleep Apnea, Hypertension, Paraplegia Sleep Apnea, Hypertension, Paraplegia Sleep Apnea, Hypertension, Paraplegia Comorbid History: 05/24/2022 06/15/2022 06/22/2022 Date Acquired: 13 11 9  Weeks of Treatment: Open Open Open Wound Status: No No No Wound Recurrence: No No No Clustered Wound: [name] [name] [name] Clustered Quantity: 1x1x0.1 1.7x0.6x0.1 4x1.8x0.1 Measurements L x W x D (cm) 0.785 0.801 5.655 [name] (cm) : rea 0.079 0.08 0.565 Volume (cm) : 87.50% 37.00% -0.80% % Reduction in [name] rea: 87.40% 37.00% -0.70% % Reduction in Volume: Category/Stage III Full Thickness Without Exposed Full Thickness Without Exposed Classification: Support Structures Support Structures Medium Medium None Present Exudate [name] mount: Sanguinous Serosanguineous [name] Exudate Type: red red, brown [name] Exudate Color: Flat and Intact Flat and Intact [name] Wound Margin: Large (67-100%) Medium (34-66%) Large (67-100%) Granulation Amount: Red, Pink Red, Friable [name] Granulation Quality: Small (1-33%) Medium (34-66%) None Present (0%) Necrotic Amount: Adherent Slough Eschar [name] Necrotic Tissue: Fat Layer (Subcutaneous Tissue): Yes Fat  Layer (Subcutaneous Tissue): Yes [name] Exposed Structures: Fascia: No Fascia: No Tendon: No Tendon: No Muscle: No Muscle: No Joint: No Joint: No Bone: No Bone: No Large (67-100%) Small (1-33%) [name] Epithelialization: [name], [name] ([id number]) [id number] Page 4 of 17 [name] Debridement - Excisional Debridement - Excisional Debridement: [name] 09:07 09:07 Pre-procedure Verification/Time Out Taken: [name] Subcutaneous, Slough Subcutaneous, Slough Tissue Debrided: [name] Skin/Subcutaneous Tissue Skin/Subcutaneous Tissue Level: [name] 7.2 7.2 Debridement [name] (sq cm): rea [name] Curette Curette Instrument: [name] Moderate Moderate Bleeding: [name] Silver Nitrate Silver Nitrate Hemostasis [name] chieved: [name] 0 0 Procedural Pain: [name] Procedure was tolerated well Procedure was tolerated well Debridement Treatment Response: [name] 4x1.8x0.1 4x1.8x0.1 Post Debridement Measurements L x W x D (cm) [name] 0.565 0.565 Post Debridement Volume: (cm) Maceration: Yes Dry/Scaly: Yes Dry/Scaly: Yes Periwound Skin Moisture: Maceration: No [name] Debridement [name] Procedures Performed: Wound Number:  40 [name] [name] Photos: No Photos [name] [name] Left, Medial Upper Leg [name] [name] Wound Location: Trauma [name] [name] Wounding Event: Abrasion [name] [name] Primary Etiology: [name] [name] [name] Comorbid History: 08/08/2022 [name] [name] Date Acquired: 3 [name] [name] Weeks of Treatment: Open [name] [name] Wound Status: No [name] [name] Wound Recurrence: No [name] [name] Clustered Wound: [name] [name] [name] Clustered Quantity: 0.1x0.1x0.1 [name] [name] Measurements L x W x D (cm) 0.008 [name] [name] [name] (cm) : rea 0.001 [name] [name] Volume (cm) : 99.80% [name] [name] % Reduction in [name] rea: 99.80% [name] [name] % Reduction in Volume: Full Thickness Without Exposed [name] [name] Classification: Support Structures Medium [name] [name] Exudate [name] mount: Sanguinous [name] [name] Exudate Type: red [name] [name] Exudate Color: [name] [name] [name] Wound Margin: [name] [name] [name] Granulation [name] mount: [name] [name]  [name] Granulation Quality: [name] [name] [name] Necrotic [name] mount: [name] [name] [name] Necrotic Tissue: [name] [name] [name] Exposed Structures: [name] [name] [name] Epithelialization: [name] [name] [name] Debridement: [name] [name] [name] Tissue Debrided: [name] [name] [name] Level: [name] [name] [name] Debridement [name] (sq cm): rea [name] [name] [name] Instrument: [name] [name] [name] Bleeding: [name] [name] [name] Hemostasis [name] chieved: [name] [name] [name] Procedural Pain: Debridement Treatment Response: [name] [name] [name] Post Debridement Measurements L x [name] [name] [name] W x D (cm) [name] [name] [name] Post Debridement Volume: (cm) [name] [name] [name] Procedures Performed: Treatment Notes Electronic Signature(s) Signed: 08/31/2022 9:19:22 AM By: Fredirick Maudlin MD FACS Entered By: Fredirick Maudlin on 08/31/2022 09:19:22 -------------------------------------------------------------------------------- Multi-Disciplinary Care Plan Details Patient Name: Date of Service: Suell, [name] 08/31/2022 8:15 [name] Medical Record Number: [id number] Patient Account Number: [id number] Date of Birth/Sex: Treating RN: 01/06/1986 (36 y.o. Waldron Session Primary Care Brennyn Haisley: Janine Limbo Other ClinicianBRAYDN, CARNEIRO ([id number]) [id number] Page 5 of 17 Referring Cruze Zingaro: Treating Gwendolen Hewlett/Extender: Cornelia Copa [name]: [place] reviewed with physician Active Inactive Pressure Nursing Diagnoses: Knowledge deficit related to management of pressures ulcers Potential for impaired tissue integrity related to pressure, friction, moisture, and shear Goals: Patient will remain free from development of additional pressure ulcers Date Initiated: 06/21/2018 Date Inactivated: 02/12/2019 Target Resolution Date: 02/16/2019 Unmet Reason: pressure ulcer Goal Status: Unmet reopened on left ischium Patient/caregiver will verbalize risk factors for pressure ulcer development Date Initiated: 06/21/2018 Date Inactivated: 05/28/2019 Target  Resolution Date: 06/01/2019 Goal Status: Met Patient/caregiver will verbalize understanding of pressure ulcer management Date Initiated: 06/01/2022 Target Resolution Date: 09/06/2022 Goal Status: Active Interventions: Assess: immobility, friction, shearing, incontinence upon admission and as needed Assess offloading mechanisms upon admission and as needed Assess potential for pressure ulcer upon admission and as needed Treatment Activities: Pressure reduction/relief device ordered : 12/05/2017 Notes: Wound/Skin Impairment Nursing Diagnoses: Impaired tissue integrity Knowledge deficit related to ulceration/compromised skin integrity Goals: Patient/caregiver will verbalize understanding of skin care regimen Date Initiated: 06/01/2022 Target Resolution Date: 09/06/2022 Goal Status: Active Ulcer/skin breakdown will have [name] volume reduction of 30% by week 4 Date Initiated: 06/01/2022 Date Inactivated: 06/29/2022 Target Resolution Date: 06/29/2022 Unmet Reason: complex wounds, Goal Status: Unmet infection Interventions: Assess patient/caregiver ability to obtain necessary supplies Assess ulceration(s) every visit Provide education on ulcer and skin care Notes: 02/02/21: Complex Care, ongoing. Electronic Signature(s) Signed: 09/03/2022 5:15:19 PM By: Blanche East RN Entered By: Blanche East on 08/31/2022 10:35:24 -------------------------------------------------------------------------------- Pain Assessment Details Patient Name: Date of Service: Sluka, [name] 08/31/2022 8:15 [name] Medical Record Number: [id number] Patient Account Number: [id number] Date of Birth/Sex: Treating RN: 12-19-1985 (36 y.o. M) Zochol, Jamie Frimpong,  [name] ([id number]) [id number] Page 6 of 17 Primary Care Davyn Morandi: [place], [place] Other Clinician: Referring Jesselee Poth: Treating Evadna Donaghy/Extender: Cornelia Copa [name]: [phone] Active Problems Location of Pain  Severity and Description of Pain Patient Has Paino No Site Locations Pain Management and Medication Current Pain Management: Electronic Signature(s) Signed: 09/03/2022 5:15:19 PM By: Blanche East RN Entered By: Blanche East on 08/31/2022 08:37:13 -------------------------------------------------------------------------------- Patient/Caregiver Education Details Patient Name: Date of Service: [name], [name] 10/10/2023andnbsp8:15 [name] Medical Record Number: [id number] Patient Account Number: [id number] Date of Birth/Gender: Treating RN: 30-Oct-1986 (36 y.o. Waldron Session Primary Care Physician: Janine Limbo Other Clinician: Referring Physician: Treating Physician/Extender: Cornelia Copa [name]: [phone] Education Assessment Education Provided To: Patient Education Topics Provided Wound/Skin Impairment: Methods: Explain/Verbal Responses: Reinforcements needed, State content correctly Electronic Signature(s) Signed: 09/03/2022 5:15:19 PM By: Blanche East RN Entered By: Blanche East on 08/31/2022 10:35:42 Antenucci, [name] ([id number]) [id number] Page 7 of 17 -------------------------------------------------------------------------------- Wound Assessment Details Patient Name: Date of Service: Havrilla, [name] 08/31/2022 8:15 [name] Medical Record Number: [id number] Patient Account Number: [id number] Date of Birth/Sex: Treating RN: [birth year] (36 y.o. Waldron Session Primary Care Shatasia Cutshaw: [name], [name] Other Clinician: Referring Stefano Trulson: Treating Adrea Sherpa/Extender: Graciela Husbands, [name] [name]: 347 Wound Status Wound Number: [id number] Primary Etiology: Pressure Ulcer Wound Location: Left Ischium Wound Status: Open Wounding Event: Gradually Appeared Comorbid History: Sleep Apnea, Hypertension, Paraplegia Date Acquired: 03/16/2020 Weeks Of Treatment: 128 Clustered Wound: Yes Photos Wound Measurements Length:  (cm) Width: (cm) Depth: (cm) Clustered Quantity: Area: (cm) Volume: (cm) 1 % Reduction in Area: 56.5% 1 % Reduction in Volume: 56.4% 0.1 Epithelialization: Medium (34-66%) 3 Tunneling: No 0.785 Undermining: No 0.079 Wound Description Classification: Category/Stage II Wound Margin: Distinct, outline attached Exudate Amount: Large Exudate Type: Sanguinous Exudate Color: red Foul Odor After Cleansing: No Slough/Fibrino Yes Wound Bed Granulation Amount: Medium (34-66%) Exposed Structure Granulation Quality: Red, Pink Fascia Exposed: No Necrotic Amount: Medium (34-66%) Fat Layer (Subcutaneous Tissue) Exposed: Yes Necrotic Quality: Adherent Slough Tendon Exposed: No Muscle Exposed: No Joint Exposed: No Bone Exposed: No Periwound Skin Texture Texture Color No Abnormalities Noted: No No Abnormalities Noted: No Moisture No Abnormalities Noted: No Maceration: Yes Treatment Notes Wound #41R (Ischium) Wound Laterality: Left Cleanser Bartnik, [name] ([id number]) [id number] Page 8 of 17 Soap and Water Discharge Instruction: May shower and wash wound with dial antibacterial soap and water prior to dressing change. Peri-Wound Care Zinc Oxide Ointment 30g tube Discharge Instruction: Apply Zinc Oxide to periwound with each dressing change Topical Keystone antibiotic compound Discharge Instruction: thin layer to wound bed, use daily once available Primary Dressing KerraCel Ag Gelling Fiber Dressing, 4x5 in (silver alginate) Discharge Instruction: Apply silver alginate to wound bed as instructed Secondary Dressing ALLEVYN Gentle Border, 4x4 (in/in) Discharge Instruction: Apply over primary dressing as directed. Secured With Compression Wrap Compression Stockings Environmental education officer) Signed: 09/03/2022 5:15:19 PM By: Blanche East RN Entered By: Blanche East on 08/31/2022  09:01:29 -------------------------------------------------------------------------------- Wound Assessment Details Patient Name: Date of Service: Trombetta, [name] 08/31/2022 8:15 [name] Medical Record Number: [id number] Patient Account Number: [id number] Date of Birth/Sex: Treating RN: 1986-08-09 (36 y.o. Waldron Session Primary Care Taelynn Mcelhannon: [place], [place] Other Clinician: Referring Barnett Elzey: Treating Fredi Geiler/Extender: Graciela Husbands, [name] [name]: 347 Wound Status Wound Number: 52 Primary Etiology: Trauma, Other Wound Location: Right, Dorsal Foot Wound Status: Open Wounding Event: Gradually Appeared Comorbid History: Sleep Apnea, Hypertension, Paraplegia Date Acquired:  03/27/2021 Weeks Of Treatment: 74 Clustered Wound: No Photos Wound Measurements Length: (cm) 1 Width: (cm) 1.2 Depth: (cm) 0.1 Area: (cm) 0.942 Stitzer, [name] ([id number]) Volume: (cm) 0.094 % Reduction in Area: 50% % Reduction in Volume: 50% Epithelialization: Medium (34-66%) Tunneling: No [id number] Page 9 of 17 Undermining: No Wound Description Classification: Full Thickness Without Exposed Support Structures Wound Margin: Flat and Intact Exudate Amount: None Present Foul Odor After Cleansing: No Slough/Fibrino Yes Wound Bed Granulation Amount: Small (1-33%) Exposed Structure Granulation Quality: Red Fascia Exposed: No Necrotic Amount: Large (67-100%) Fat Layer (Subcutaneous Tissue) Exposed: Yes Necrotic Quality: Eschar Tendon Exposed: No Muscle Exposed: No Joint Exposed: No Bone Exposed: No Periwound Skin Texture Texture Color No Abnormalities Noted: No No Abnormalities Noted: No Moisture No Abnormalities Noted: No Dry / Scaly: Yes Treatment Notes Wound #52 (Foot) Wound Laterality: Dorsal, Right Cleanser Soap and Water Discharge Instruction: May shower and wash wound with dial antibacterial soap and water prior to dressing  change. Peri-Wound Care Triamcinolone 15 (g) Discharge Instruction: Use triamcinolone 15 (g) as directed Sween Lotion (Moisturizing lotion) Discharge Instruction: Apply Aquaphor moisturizing lotion as directed Lotrisone Discharge Instruction: or antifungal spray, Apply Lotrisone to periwound Topical Primary Dressing KerraCel Ag Gelling Fiber Dressing, 2x2 in (silver alginate) Discharge Instruction: Apply silver alginate to wound bed as instructed Secondary Dressing Woven Gauze Sponge, Non-Sterile 4x4 in Discharge Instruction: Apply over primary dressing as directed. Secured With [employer], 4.5x3.1 (in/yd) Discharge Instruction: Secure with Kerlix as directed. 38M Medipore H Soft Cloth Surgical T ape, 4 x 10 (in/yd) Discharge Instruction: Secure with tape as directed. Compression Wrap Compression Stockings Circaid Juxta Lite Compression Wrap Quantity: 1 Right Leg Compression Amount: 30-40 mmHg Discharge Instruction: Apply Circaid Juxta Lite Compression Wrap daily as instructed. Apply first thing in the morning, remove at night before bed. Add-Ons Electronic Signature(s) Signed: 09/03/2022 5:15:19 PM By: Blanche East RN Entered By: Blanche East on 08/31/2022 08:55:24 Mcclaren, [name] ([id number]) [id number] Page 10 of 17 -------------------------------------------------------------------------------- Wound Assessment Details Patient Name: Date of Service: Pies, [name] 08/31/2022 8:15 [name] Medical Record Number: [id number] Patient Account Number: [id number] Date of Birth/Sex: Treating RN: November 08, 1986 (36 y.o. Waldron Session Primary Care Rock Sobol: [name], [name] Other Clinician: Referring Xara Paulding: Treating Patrina Andreas/Extender: Graciela Husbands, [name] [name]: 347 Wound Status Wound Number: 56 Primary Etiology: Neuropathic Ulcer-Non Diabetic Wound Location: Left, Dorsal Foot Wound Status: Open Wounding Event: Gradually  Appeared Comorbid History: Sleep Apnea, Hypertension, Paraplegia Date Acquired: 07/11/2021 Weeks Of Treatment: 58 Clustered Wound: Yes Photos Wound Measurements Length: (cm) 1.8 Width: (cm) 1 Depth: (cm) 0.1 Area: (cm) 1.414 Volume: (cm) 0.141 % Reduction in Area: 77.3% % Reduction in Volume: 77.3% Epithelialization: Large (67-100%) Tunneling: No Undermining: No Wound Description Classification: Full Thickness Without Exposed Support Structures Wound Margin: Flat and Intact Exudate Amount: None Present Foul Odor After Cleansing: No Slough/Fibrino Yes Wound Bed Granulation Amount: Medium (34-66%) Exposed Structure Granulation Quality: Red, Pink Fascia Exposed: No Necrotic Amount: Medium (34-66%) Fat Layer (Subcutaneous Tissue) Exposed: Yes Necrotic Quality: Eschar Tendon Exposed: No Muscle Exposed: No Joint Exposed: No Bone Exposed: No Periwound Skin Texture Texture Color No Abnormalities Noted: No No Abnormalities Noted: No Moisture No Abnormalities Noted: No Dry / Scaly: Yes Treatment Notes Wound #56 (Foot) Wound Laterality: Dorsal, Left Cleanser Soap and Water Discharge Instruction: May shower and wash wound with dial antibacterial soap and water prior to dressing change. Kehm, [name] ([id number]) [id number] Page 11 of 17 Peri-Wound Care Triamcinolone 15 (  g) Discharge Instruction: Use triamcinolone 15 (g) as directed Sween Lotion (Moisturizing lotion) Discharge Instruction: Apply Aquaphor moisturizing lotion as directed Lotrisone Discharge Instruction: or antifungal spray, Apply Lotrisone to periwound Topical Primary Dressing KerraCel Ag Gelling Fiber Dressing, 2x2 in (silver alginate) Discharge Instruction: Apply silver alginate to wound bed as instructed Secondary Dressing Woven Gauze Sponge, Non-Sterile 4x4 in Discharge Instruction: Apply over primary dressing as directed. Secured With [employer], 4.5x3.1  (in/yd) Discharge Instruction: Secure with Kerlix as directed. 31M Medipore H Soft Cloth Surgical T ape, 4 x 10 (in/yd) Discharge Instruction: Secure with tape as directed. Compression Wrap Compression Stockings Add-Ons Electronic Signature(s) Signed: 09/03/2022 5:15:19 PM By: Blanche East RN Entered By: Blanche East on 08/31/2022 08:58:17 -------------------------------------------------------------------------------- Wound Assessment Details Patient Name: Date of Service: Plair, [name] 08/31/2022 8:15 [name] Medical Record Number: [id number] Patient Account Number: [id number] Date of Birth/Sex: Treating RN: 11/14/1986 (37 y.o. Waldron Session Primary Care Latrail Pounders: [place], [place] Other Clinician: Referring Marina Desire: Treating Advik Weatherspoon/Extender: Graciela Husbands, [name] [name]: 347 Wound Status Wound Number: 65 Primary Etiology: Pressure Ulcer Wound Location: Left Upper Leg Wound Status: Open Wounding Event: Pressure Injury Comorbid History: Sleep Apnea, Hypertension, Paraplegia Date Acquired: 05/24/2022 Weeks Of Treatment: 13 Clustered Wound: No Photos Wound Measurements Shankar, [name] ([id number]) Length: (cm) 1 Width: (cm) 1 Depth: (cm) 0.1 Area: (cm) 0.785 Volume: (cm) 0.079 [id number] Page 12 of 17 % Reduction in Area: 87.5% % Reduction in Volume: 87.4% Epithelialization: Large (67-100%) Tunneling: No Undermining: No Wound Description Classification: Category/Stage III Wound Margin: Flat and Intact Exudate Amount: Medium Exudate Type: Sanguinous Exudate Color: red Foul Odor After Cleansing: No Slough/Fibrino No Wound Bed Granulation Amount: Large (67-100%) Exposed Structure Granulation Quality: Red, Pink Fascia Exposed: No Necrotic Amount: Small (1-33%) Fat Layer (Subcutaneous Tissue) Exposed: Yes Necrotic Quality: Adherent Slough Tendon Exposed: No Muscle Exposed: No Joint Exposed: No Bone Exposed:  No Periwound Skin Texture Texture Color No Abnormalities Noted: No No Abnormalities Noted: No Moisture No Abnormalities Noted: No Maceration: Yes Treatment Notes Wound #65 (Upper Leg) Wound Laterality: Left Cleanser Soap and Water Discharge Instruction: May shower and wash wound with dial antibacterial soap and water prior to dressing change. Peri-Wound Care Zinc Oxide Ointment 30g tube Discharge Instruction: Apply Zinc Oxide to periwound with each dressing change Topical Keystone antibiotic compound Discharge Instruction: thin layer to wound bed, use daily once available Primary Dressing KerraCel Ag Gelling Fiber Dressing, 4x5 in (silver alginate) Discharge Instruction: Apply silver alginate to wound bed as instructed Secondary Dressing ALLEVYN Gentle Border, 4x4 (in/in) Discharge Instruction: Apply over primary dressing as directed. Secured With Compression Wrap Compression Stockings Environmental education officer) Signed: 09/03/2022 5:15:19 PM By: Blanche East RN Entered By: Blanche East on 08/31/2022 09:02:15 Wound Assessment Details -------------------------------------------------------------------------------- Dinges, [name] ([id number]) [id number] Page 13 of 17 Patient Name: Date of Service: Capizzi, [name] 08/31/2022 8:15 [name] Medical Record Number: [id number] Patient Account Number: [id number] Date of Birth/Sex: Treating RN: 1986-05-30 (36 y.o. Waldron Session Primary Care Ayuub Penley: Kendrick, [place] Other Clinician: Referring Nataliee Shurtz: Treating Gabrille Kilbride/Extender: Graciela Husbands, [name] [name]: 347 Wound Status Wound Number: 66 Primary Etiology: Venous Leg Ulcer Wound Location: Right, Anterior Ankle Wound Status: Open Wounding Event: Pressure Injury Comorbid History: Sleep Apnea, Hypertension, Paraplegia Date Acquired: 06/15/2022 Weeks Of Treatment: 11 Clustered Wound: No Photos Wound Measurements Length: (cm)  1.7 % Reduction in Area: 37% Width: (cm) 0.6 % Reduction in Volume: 37% Depth: (cm) 0.1 Epithelialization: Small (1-33%)  Area: (cm) 0.801 Tunneling: No Volume: (cm) 0.08 Undermining: No Wound Description Classification: Full Thickness Without Exposed Support Structures Foul Odor After Cleansing: No Wound Margin: Flat and Intact Slough/Fibrino No Exudate Amount: Medium Exudate Type: Serosanguineous Exudate Color: red, brown Wound Bed Granulation Amount: Medium (34-66%) Exposed Structure Granulation Quality: Red, Friable Fascia Exposed: No Necrotic Amount: Medium (34-66%) Fat Layer (Subcutaneous Tissue) Exposed: Yes Necrotic Quality: Eschar Tendon Exposed: No Muscle Exposed: No Joint Exposed: No Bone Exposed: No Periwound Skin Texture Texture Color No Abnormalities Noted: No No Abnormalities Noted: No Moisture No Abnormalities Noted: No Dry / Scaly: Yes Maceration: No Treatment Notes Wound #66 (Ankle) Wound Laterality: Right, Anterior Cleanser Soap and Water Discharge Instruction: May shower and wash wound with dial antibacterial soap and water prior to dressing change. Peri-Wound Care Triamcinolone 15 (g) Discharge Instruction: Use triamcinolone 15 (g) as directed Sween Lotion (Moisturizing lotion) Busch, [name] ([id number]) [id number].pdf Page 14 of 17 Discharge Instruction: Apply Aquaphor moisturizing lotion as directed Lotrisone Discharge Instruction: Apply Lotrisone to periwound Topical Primary Dressing KerraCel Ag Gelling Fiber Dressing, 2x2 in (silver alginate) Discharge Instruction: Apply silver alginate to wound bed as instructed Secondary Dressing Woven Gauze Sponge, Non-Sterile 4x4 in Discharge Instruction: Apply over primary dressing as directed. Secured With [employer], 4.5x3.1 (in/yd) Discharge Instruction: Secure with Kerlix as directed. 42M Medipore H Soft Cloth Surgical T ape, 4 x 10 (in/yd) Discharge  Instruction: Secure with tape as directed. Compression Wrap Compression Stockings Add-Ons Electronic Signature(s) Signed: 09/03/2022 5:15:19 PM By: Blanche East RN Entered By: Blanche East on 08/31/2022 09:14:10 -------------------------------------------------------------------------------- Wound Assessment Details Patient Name: Date of Service: Regner, [name] 08/31/2022 8:15 [name] Medical Record Number: [id number] Patient Account Number: [id number] Date of Birth/Sex: Treating RN: November 26, 1985 (36 y.o. Waldron Session Primary Care Keidrick Murty: [place], [place] Other Clinician: Referring Lindy Garczynski: Treating Lilianna Case/Extender: Graciela Husbands, [name] [name]: 347 Wound Status Wound Number: 67 Primary Etiology: Venous Leg Ulcer Wound Location: Left, Lateral Lower Leg Wound Status: Open Wounding Event: Gradually Appeared Comorbid History: Sleep Apnea, Hypertension, Paraplegia Date Acquired: 06/22/2022 Weeks Of Treatment: 9 Clustered Wound: No Photos Wound Measurements Length: (cm) 4 Width: (cm) 1.8 Depth: (cm) 0.1 Area: (cm) 5.655 Ressler, [name] ([id number]) Volume: (cm) 0.565 % Reduction in Area: -0.8% % Reduction in Volume: -0.7% Tunneling: No Undermining: No [id number] Page 15 of 17 Wound Description Classification: Full Thickness Without Exposed Suppor Exudate Amount: None Present t Structures Foul Odor After Cleansing: No Slough/Fibrino No Wound Bed Granulation Amount: Large (67-100%) Necrotic Amount: None Present (0%) Periwound Skin Texture Texture Color No Abnormalities Noted: No No Abnormalities Noted: No Moisture No Abnormalities Noted: No Dry / Scaly: Yes Treatment Notes Wound #67 (Lower Leg) Wound Laterality: Left, Lateral Cleanser Soap and Water Discharge Instruction: May shower and wash wound with dial antibacterial soap and water prior to dressing change. Peri-Wound Care Triamcinolone 15 (g) Discharge  Instruction: Use triamcinolone 15 (g) as directed Sween Lotion (Moisturizing lotion) Discharge Instruction: Apply Aquaphor moisturizing lotion as directed Lotrisone Discharge Instruction: Apply Lotrisone to periwound Topical Primary Dressing Promogran Prisma Matrix, 4.34 (sq in) (silver collagen) Discharge Instruction: Moisten collagen with saline or hydrogel Secondary Dressing Woven Gauze Sponge, Non-Sterile 4x4 in Discharge Instruction: Apply over primary dressing as directed. Secured With [employer], 4.5x3.1 (in/yd) Discharge Instruction: Secure with Kerlix as directed. 42M Medipore H Soft Cloth Surgical T ape, 4 x 10 (in/yd) Discharge Instruction: Secure with tape as directed. Compression Wrap Compression Stockings Add-Ons Electronic Signature(s) Signed: 09/03/2022  5:15:19 PM By: Blanche East RN Entered By: Blanche East on 08/31/2022 09:13:49 -------------------------------------------------------------------------------- Wound Assessment Details Patient Name: Date of Service: Boeve, [name] 08/31/2022 8:15 [name] Medical Record Number: [id number] Patient Account Number: [id number] Date of Birth/Sex: Treating RN: November 09, 1986 (36 y.o. Hipolito, Martinezlopez ([id number]) [id number] Page 16 of 17 Primary Care Elford Evilsizer: [place], [place] Other Clinician: Referring Falan Hensler: Treating Naveen Clardy/Extender: Graciela Husbands, [name] [name]: 347 Wound Status Wound Number: 68 Primary Etiology: Abrasion Wound Location: Left, Medial Upper Leg Wound Status: Healed - Epithelialized Wounding Event: Trauma Date Acquired: 08/08/2022 Weeks Of Treatment: 3 Clustered Wound: No Wound Measurements Length: (cm) Width: (cm) Depth: (cm) Area: (cm) Volume: (cm) 0 % Reduction in Area: 100% 0 % Reduction in Volume: 100% 0 0 0 Wound Description Classification: Full Thickness Without Exposed Support Exudate Amount: Medium Exudate  Type: Sanguinous Exudate Color: red Structures Periwound Skin Texture Texture Color No Abnormalities Noted: No No Abnormalities Noted: No Moisture No Abnormalities Noted: No Treatment Notes Wound #68 (Upper Leg) Wound Laterality: Left, Medial Cleanser Peri-Wound Care Topical Primary Dressing Secondary Dressing Secured With Compression Wrap Compression Stockings Add-Ons Electronic Signature(s) Signed: 09/03/2022 5:15:19 PM By: Blanche East RN Entered By: Blanche East on 08/31/2022 10:33:46 -------------------------------------------------------------------------------- Vitals Details Patient Name: Date of Service: Maslanka, [name] 08/31/2022 8:15 [name] Medical Record Number: [id number] Patient Account Number: [id number] Date of Birth/Sex: Treating RN: 04/21/1986 (36 y.o. Waldron Session Primary Care Amirra Herling: [name], [place] Other Clinician: Referring Jamarion [place]: Treating Arien Benincasa/Extender: Graciela Husbands, [name] [name]: [place] Signs Pasquarella, Adan [name] ([id number]) [id number] Page 17 of 17 Time Taken: 08:37 Temperature (F): 98.4 Height (in): 70 Pulse (bpm): 107 Weight (lbs): 216 Respiratory Rate (breaths/min): 16 Body Mass Index (BMI): 31 Blood Pressure (mmHg): 120/75 Reference Range: 80 - 120 mg / dl Electronic Signature(s) Signed: 09/03/2022 5:15:19 PM By: Blanche East RN Entered By: Blanche East on 08/31/2022 09:23:01

## 2022-09-09 ENCOUNTER — Ambulatory Visit: Payer: Self-pay | Admitting: Licensed Clinical Social Worker

## 2022-09-09 NOTE — Patient Outreach (Signed)
  Care Coordination   09/09/2022 Name: [name] MRN: [id number] DOB: 11/18/1986   Care Coordination Outreach Attempts:  A second unsuccessful outreach was attempted today to offer the patient with information about available care coordination services as a benefit of their health plan.     Follow Up Plan:  Additional outreach attempts will be made to offer the patient care coordination information and services.   Encounter Outcome:  No Answer  Care Coordination Interventions Activated:  No   Care Coordination Interventions:  No, not indicated    Milus Height, MSW,LCSW-A  Social Worker [facility] Care [facility]  [phone]

## 2022-09-13 ENCOUNTER — Ambulatory Visit: Payer: Self-pay | Admitting: Licensed Clinical Social Worker

## 2022-09-13 NOTE — Patient Outreach (Signed)
  Care Coordination   09/13/2022 Name: [name] MRN: [id number] DOB: 05/15/1986   Care Coordination Outreach Attempts:  A third unsuccessful outreach was attempted today to offer the patient with information about available care coordination services as a benefit of their health plan.   Follow Up Plan:  No further outreach attempts will be made at this time. We have been unable to contact the patient to offer or enroll patient in care coordination services  Encounter Outcome:  No Answer  Care Coordination Interventions Activated:  No   Care Coordination Interventions:  No, not indicated    Milus Height, Arita Miss , MSW, [place] Social Worker [facility] Care Management  [phone]

## 2022-09-21 ENCOUNTER — Encounter (HOSPITAL_BASED_OUTPATIENT_CLINIC_OR_DEPARTMENT_OTHER): Payer: BC Managed Care – PPO | Admitting: General Surgery

## 2022-09-21 DIAGNOSIS — G8220 Paraplegia, unspecified: Secondary | ICD-10-CM | POA: Diagnosis not present

## 2022-09-21 DIAGNOSIS — I87332 Chronic venous hypertension (idiopathic) with ulcer and inflammation of left lower extremity: Secondary | ICD-10-CM | POA: Diagnosis not present

## 2022-09-21 DIAGNOSIS — L97322 Non-pressure chronic ulcer of left 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: Secondary | ICD-10-CM | POA: Diagnosis not present

## 2022-09-21 DIAGNOSIS — G4730 Sleep apnea, unspecified: Secondary | ICD-10-CM | POA: Diagnosis not present

## 2022-09-21 DIAGNOSIS — I10 Essential (primary) hypertension: Secondary | ICD-10-CM | POA: Diagnosis not present

## 2022-09-21 DIAGNOSIS — L89329 Pressure ulcer of left buttock, unspecified stage: Secondary | ICD-10-CM | POA: Diagnosis not present

## 2022-09-22 NOTE — Progress Notes (Signed)
Kurka, Drayk E ([id number]) [id number] Page 1 of 17 Visit Report for 09/21/2022 Arrival Information Details Patient Name: Date of Service: Mulford, [name] 09/21/2022 8:15 A M Medical Record Number: [id number] Patient Account Number: [id number] Date of Birth/Sex: Treating RN: Apr 21, 1986 (36 y.o. M) Primary Care Tyliah Schlereth: [place], [place] Other Clinician: Referring Cailean Heacock: Treating Nefertari Rebman/Extender: Cornelia Copa [name]: 350 Visit Information History Since Last Visit All ordered tests and consults were completed: No Patient Arrived: Wheel Chair Added or deleted any medications: No Arrival Time: 08:11 Any new allergies or adverse reactions: No Accompanied By: self Had a fall or experienced change in No Transfer Assistance: None activities of daily living that may affect Patient Identification Verified: Yes risk of falls: Secondary Verification Process Completed: Yes Signs or symptoms of abuse/neglect since last visito No Patient Requires Transmission-Based Precautions: No Hospitalized since last visit: No Patient Has Alerts: Yes Implantable device outside of the clinic excluding No Patient Alerts: R ABI = 1.0 cellular tissue based products placed in the center L ABI = 1.1 since last visit: Has Dressing in Place as Prescribed: Yes Has Compression in Place as Prescribed: Yes Pain Present Now: No Electronic Signature(s) Signed: 09/21/2022 5:36:35 PM By: Baruch Gouty RN, BSN Entered By: Baruch Gouty on 09/21/2022 08:13:36 -------------------------------------------------------------------------------- Encounter Discharge Information Details Patient Name: Date of Service: Mally, [name] 09/21/2022 8:15 A M Medical Record Number: [id number] Patient Account Number: [id number] Date of Birth/Sex: Treating RN: 1986/08/15 (36 y.o. [name] Primary Care Chazlyn Cude: [place], [place] Other Clinician: Referring  Keawe Marcello: Treating Kenika Sahm/Extender: Cornelia Copa [name]: 350 Encounter Discharge Information Items Post Procedure Vitals Discharge Condition: Stable Temperature (F): 98.4 Ambulatory Status: Wheelchair Pulse (bpm): 105 Discharge Destination: Home Respiratory Rate (breaths/min): 18 Transportation: Private Auto Blood Pressure (mmHg): 137/77 Accompanied By: self Schedule Follow-up Appointment: Yes Clinical Summary of Care: Patient Declined Electronic Signature(s) Signed: 09/21/2022 5:36:35 PM By: Baruch Gouty RN, BSN Entered By: Baruch Gouty on 09/21/2022 15:41:05 [place]clough, [name] ([id number]) [id number] Page 2 of 17 -------------------------------------------------------------------------------- Lower Extremity Assessment Details Patient Name: Date of Service: Gutkowski, [name] 09/21/2022 8:15 A M Medical Record Number: [id number] Patient Account Number: [id number] Date of Birth/Sex: Treating RN: 1986-09-19 (36 y.o. [name] Primary Care Rushawn Capshaw: [place], [place] Other Clinician: Referring Ovila Lepage: Treating Campbell Agramonte/Extender: Graciela Husbands, [name]: 350 Edema Assessment Assessed: [Left: No] [Right: No] Edema: [Left: Yes] [Right: Yes] Calf Left: Right: Point of Measurement: 33 cm From Medial Instep 28.5 cm 33 cm Ankle Left: Right: Point of Measurement: 10 cm From Medial Instep 25 cm 25 cm Vascular Assessment Pulses: Dorsalis Pedis Palpable: [Left:Yes] [Right:Yes] Electronic Signature(s) Signed: 09/21/2022 5:36:35 PM By: Baruch Gouty RN, BSN Entered By: Baruch Gouty on 09/21/2022 08:23:37 -------------------------------------------------------------------------------- Multi Wound Chart Details Patient Name: Date of Service: Latu, A Seth Ward 09/21/2022 8:15 A M Medical Record Number: [id number] Patient Account Number: [id number] Date of Birth/Sex: Treating RN: [birth year] (36  y.o. M) Primary Care Terricka Onofrio: [place], [place] Other Clinician: Referring Armando Bukhari: Treating Lizette Pazos/Extender: Graciela Husbands, [name]: 350 Vital Signs Height(in): 70 Pulse(bpm): 105 Weight(lbs): 216 Blood Pressure(mmHg): 137/77 Body Mass Index(BMI): 31 Temperature(F): 98.4 Respiratory Rate(breaths/min): 20 [52:Photos: No Photos] Right, Dorsal Foot Right, Dorsal Foot Left, Dorsal Foot Wound Location: Gradually Appeared Gradually Appeared Gradually Appeared Wounding Event: Whisonant, Haley E ([id number]) [id number] Page 3 of 17 Trauma, Other Trauma, Other Neuropathic Ulcer-Non Diabetic Primary Etiology: Sleep Apnea, Hypertension, Paraplegia Sleep Apnea, Hypertension, Paraplegia Sleep Apnea, Hypertension,  Paraplegia Comorbid History: 03/27/2021 03/27/2021 07/11/2021 Date Acquired: 77 77 61 Weeks of Treatment: Open Open Open Wound Status: No No No Wound Recurrence: No No Yes Clustered Wound: 0.3x0.3x0.1 0.3x0.3x0.1 0.5x0.1x0.1 Measurements L x W x D (cm) 0.071 0.071 0.039 A (cm) : rea 0.007 0.007 0.004 Volume (cm) : 96.20% 96.20% 99.40% % Reduction in A rea: 96.30% 96.30% 99.40% % Reduction in Volume: Full Thickness Without Exposed Full Thickness Without Exposed Full Thickness Without Exposed Classification: Support Structures Support Structures Support Structures Medium None Present Small Exudate Amount: Serosanguineous N/A Serosanguineous Exudate Type: red, brown N/A red, brown Exudate Color: No N/A No Foul Odor A Cleansing: fter N/A N/A N/A Odor Anticipated Due to Product Use: Flat and Intact N/A Flat and Intact Wound Margin: Large (67-100%) N/A Large (67-100%) Granulation A mount: Red N/A Red, Pink Granulation Quality: None Present (0%) N/A None Present (0%) Necrotic Amount: Fat Layer (Subcutaneous Tissue): Yes N/A Fat Layer (Subcutaneous Tissue): Yes Exposed Structures: Fascia: No Fascia:  No Tendon: No Tendon: No Muscle: No Muscle: No Joint: No Joint: No Bone: No Bone: No Medium (34-66%) N/A Large (67-100%) Epithelialization: Debridement - Selective/Open Wound Debridement - Selective/Open Wound Debridement - Selective/Open Wound Debridement: Pre-procedure Verification/Time Out 08:35 08:35 08:35 Taken: [facility] Tissue Debrided: Non-Viable Tissue Non-Viable Tissue Non-Viable Tissue Level: 0.09 0.09 0.05 Debridement A (sq cm): rea Curette Curette Curette Instrument: Minimum Minimum Minimum Bleeding: Pressure Pressure Pressure Hemostasis A chieved: Insensate Insensate Insensate Procedural Pain: Insensate Insensate Insensate Post Procedural Pain: Procedure was tolerated well Procedure was tolerated well Procedure was tolerated well Debridement Treatment Response: 0.3x0.3x0.1 0.3x0.3x0.1 0.5x0.1x0.1 Post Debridement Measurements L x W x D (cm) 0.007 0.007 0.004 Post Debridement Volume: (cm) N/A N/A N/A Post Debridement Stage: Excoriation: No No Abnormalities Noted Periwound Skin Texture: Induration: No Callus: No Crepitus: No Rash: No Scarring: No Dry/Scaly: Yes Dry/Scaly: Yes Periwound Skin Moisture: Maceration: No Maceration: No Atrophie Blanche: No Erythema: Yes Periwound Skin Color: Cyanosis: No Ecchymosis: No Erythema: No Hemosiderin Staining: No Mottled: No Pallor: No Rubor: No No Abnormality N/A Hot Temperature: Debridement Debridement Debridement Procedures Performed: Wound Number: [id number] Photos: No Photos Left Upper Leg Right, Anterior Ankle Right, Anterior Ankle Wound Location: Pressure Injury Pressure Injury Pressure Injury Wounding Event: Pressure Ulcer Venous Leg Ulcer Venous Leg Ulcer Primary Etiology: Sleep Apnea, Hypertension, Paraplegia Sleep Apnea, Hypertension, Paraplegia Sleep Apnea, Hypertension, Paraplegia Comorbid History: 05/24/2022 06/15/2022 06/15/2022 Date Acquired: _0 Weeks of  Treatment: Open Open Open Wound Status: No No No Wound Recurrence: No No No Clustered Wound: Lodes, Mansoor E ([id number]) [id number] Page 4 of 17 6.5x3.5x0.1 0.1x0.1x0.1 0.1x0.1x0.1 Measurements L x W x D (cm) 17.868 0.008 0.008 A (cm) : rea 1.787 0.001 0.001 Volume (cm) : -184.40% 99.40% 99.40% % Reduction in Area: -184.60% 99.20% 99.20% % Reduction in Volume: Category/Stage III Full Thickness Without Exposed Full Thickness Without Exposed Classification: Support Structures Support Structures Large None Present Medium Exudate Amount: Serosanguineous N/A Serosanguineous Exudate Type: red, brown N/A red, brown Exudate Color: Yes No N/A Foul Odor A Cleansing: fter No N/A N/A Odor Anticipated Due to Product Use: Flat and Intact Flat and Intact N/A Wound Margin: Large (67-100%) None Present (0%) N/A Granulation A mount: Red, Friable N/A N/A Granulation Quality: None Present (0%) None Present (0%) N/A Necrotic Amount: Fat Layer (Subcutaneous Tissue): Yes Fat Layer (Subcutaneous Tissue): Yes N/A Exposed Structures: Fascia: No Fascia: No Tendon: No Tendon: No Muscle: No Muscle: No Joint: No Joint: No Bone: No Bone: No  Small (1-33%) Large (67-100%) N/A Epithelialization: Debridement - Selective/Open Wound N/A N/A Debridement: Pre-procedure Verification/Time Out 08:35 N/A N/A Taken: N/A N/A N/A Tissue Debrided: Non-Viable Tissue N/A N/A Level: 22.75 N/A N/A Debridement A (sq cm): rea Curette N/A N/A Instrument: Minimum N/A N/A Bleeding: Silver Nitrate N/A N/A Hemostasis A chieved: Insensate N/A N/A Procedural Pain: Insensate N/A N/A Post Procedural Pain: Procedure was tolerated well N/A N/A Debridement Treatment Response: 6.5x3.5x0.1 N/A N/A Post Debridement Measurements L x W x D (cm) 1.787 N/A N/A Post Debridement Volume: (cm) Category/Stage III N/A N/A Post Debridement Stage: Scarring: Yes Scarring:  Yes Periwound Skin Texture: Dry/Scaly: Yes Dry/Scaly: Yes Periwound Skin Moisture: Maceration: No Maceration: No No Abnormalities Noted No Abnormalities Noted Periwound Skin Color: No Abnormality No Abnormality N/A Temperature: Debridement N/A N/A Procedures Performed: Wound Number: [id number] N/A Photos: No Photos N/A Left, Lateral Lower Leg Left, Lateral Lower Leg N/A Wound Location: Gradually Appeared Gradually Appeared N/A Wounding Event: Venous Leg Ulcer Venous Leg Ulcer N/A Primary Etiology: Sleep Apnea, Hypertension, Paraplegia Sleep Apnea, Hypertension, Paraplegia N/A Comorbid History: 06/22/2022 06/22/2022 N/A Date Acquired: 12 12 N/A Weeks of Treatment: Open Open N/A Wound Status: No No N/A Wound Recurrence: No No N/A Clustered Wound: 4.3x1.7x0.1 4.3x1.7x0.1 N/A Measurements L x W x D (cm) 5.741 5.741 N/A A (cm) : rea 0.574 0.574 N/A Volume (cm) : -2.40% -2.40% N/A % Reduction in A rea: -2.30% -2.30% N/A % Reduction in Volume: Full Thickness Without Exposed Full Thickness Without Exposed N/A Classification: Support Structures Support Structures Medium None Present N/A Exudate Amount: Serosanguineous N/A N/A Exudate Type: red, brown N/A N/A Exudate Color: No N/A N/A Foul Odor A Cleansing: fter N/A N/A N/A Odor Anticipated Due to Product Use: Distinct, outline attached N/A N/A Wound Margin: Medium (34-66%) N/A N/A Granulation A mount: Red N/A N/A Granulation Quality: Medium (34-66%) N/A N/A Necrotic Amount: Fat Layer (Subcutaneous Tissue): Yes N/A N/A Exposed Structures: Meulemans, Austan E ([id number]) [id number] Page 5 of 17 Fascia: No Tendon: No Muscle: No Joint: No Bone: No None N/A N/A Epithelialization: Debridement - Excisional Debridement - Excisional N/A Debridement: Pre-procedure Verification/Time Out 08:35 08:35 N/A Taken: Subcutaneous, Slough Subcutaneous, Slough N/A Tissue Debrided: Skin/Subcutaneous  Tissue Skin/Subcutaneous Tissue N/A Level: 7.31 7.31 N/A Debridement A (sq cm): rea Curette Curette N/A Instrument: Minimum Minimum N/A Bleeding: Pressure Pressure N/A Hemostasis A chieved: Insensate Insensate N/A Procedural Pain: Insensate Insensate N/A Post Procedural Pain: Procedure was tolerated well Procedure was tolerated well N/A Debridement Treatment Response: 4.3x1.7x0.1 4.3x1.7x0.1 N/A Post Debridement Measurements L x W x D (cm) 0.574 0.574 N/A Post Debridement Volume: (cm) N/A N/A N/A Post Debridement Stage: No Abnormalities Noted N/A Periwound Skin Texture: Dry/Scaly: Yes N/A Periwound Skin Moisture: Erythema: Yes N/A Periwound Skin Color: Hot N/A N/A Temperature: Debridement Debridement N/A Procedures Performed: Treatment Notes Electronic Signature(s) Signed: 09/21/2022 9:00:21 AM By: Fredirick Maudlin MD FACS Entered By: Fredirick Maudlin on 09/21/2022 09:00:21 -------------------------------------------------------------------------------- Multi-Disciplinary Care Plan Details Patient Name: Date of Service: Penafiel, [name] 09/21/2022 8:15 A M Medical Record Number: [id number] Patient Account Number: [id number] Date of Birth/Sex: Treating RN: [birth year] (36 y.o. [name] Primary Care Demar Shad: [place], [place] Other Clinician: Referring Raneen Jaffer: Treating Kanyon Bunn/Extender: Cornelia Copa [name]: Cane [place] reviewed with physician Active Inactive Pressure Nursing Diagnoses: Knowledge deficit related to management of pressures ulcers Potential for impaired tissue integrity related to pressure, friction, moisture, and shear Goals: Patient will remain free from development of additional pressure ulcers Date  Initiated: 06/21/2018 Date Inactivated: 02/12/2019 Target Resolution Date: 02/16/2019 Unmet Reason: pressure ulcer Goal Status: Unmet reopened on left ischium Patient/caregiver will  verbalize risk factors for pressure ulcer development Date Initiated: 06/21/2018 Date Inactivated: 05/28/2019 Target Resolution Date: 06/01/2019 Goal Status: Met Patient/caregiver will verbalize understanding of pressure ulcer management Date Initiated: 06/01/2022 Target Resolution Date: 10/19/2022 Goal Status: Active Interventions: Assess: immobility, friction, shearing, incontinence upon admission and as needed Assess offloading mechanisms upon admission and as needed Brogden, Donnovan E ([id number]) [id number] Page 6 of 17 Assess potential for pressure ulcer upon admission and as needed Treatment Activities: Pressure reduction/relief device ordered : 12/05/2017 Notes: Wound/Skin Impairment Nursing Diagnoses: Impaired tissue integrity Knowledge deficit related to ulceration/compromised skin integrity Goals: Patient/caregiver will verbalize understanding of skin care regimen Date Initiated: 06/01/2022 Target Resolution Date: 10/19/2022 Goal Status: Active Ulcer/skin breakdown will have a volume reduction of 30% by week 4 Date Initiated: 06/01/2022 Date Inactivated: 06/29/2022 Target Resolution Date: 06/29/2022 Unmet Reason: complex wounds, Goal Status: Unmet infection Interventions: Assess patient/caregiver ability to obtain necessary supplies Assess ulceration(s) every visit Provide education on ulcer and skin care Notes: 02/02/21: Complex Care, ongoing. Electronic Signature(s) Signed: 09/21/2022 5:36:35 PM By: Baruch Gouty RN, BSN Entered By: Baruch Gouty on 09/21/2022 08:32:32 -------------------------------------------------------------------------------- Pain Assessment Details Patient Name: Date of Service: Rann, [name] 09/21/2022 8:15 A M Medical Record Number: [id number] Patient Account Number: [id number] Date of Birth/Sex: Treating RN: 06-13-1986 (36 y.o. [name] Primary Care Danyle Boening: [place], [place] Other Clinician: Referring  Jakylan Ron: Treating Cheskel Silverio/Extender: Graciela Husbands, [name]: 350 Active Problems Location of Pain Severity and Description of Pain Patient Has Paino No Site Locations Rate the pain. Current Pain Level: 0 Pain Management and Medication Current Pain Management: Chambers, Benjamin E ([id number]) [id number] Page 7 of 17 Electronic Signature(s) Signed: 09/21/2022 5:36:35 PM By: Baruch Gouty RN, BSN Entered By: Baruch Gouty on 09/21/2022 08:13:52 -------------------------------------------------------------------------------- Patient/Caregiver Education Details Patient Name: Date of Service: Almquist, A Viviann Spare. 10/31/2023andnbsp8:15 A M Medical Record Number: [id number] Patient Account Number: [id number] Date of Birth/Gender: Treating RN: [birth year] (36 y.o. [name] Primary Care Physician: Janine Limbo Other Clinician: Referring Physician: Treating Physician/Extender: Cornelia Copa [name]: 350 Education Assessment Education Provided To: Patient Education Topics Provided Pressure: Methods: Explain/Verbal Responses: Reinforcements needed, State content correctly Venous: Methods: Explain/Verbal Responses: Reinforcements needed, State content correctly Wound/Skin Impairment: Methods: Explain/Verbal Responses: Reinforcements needed, State content correctly Electronic Signature(s) Signed: 09/21/2022 5:36:35 PM By: Baruch Gouty RN, BSN Entered By: Baruch Gouty on 09/21/2022 08:33:10 -------------------------------------------------------------------------------- Wound Assessment Details Patient Name: Date of Service: Dufford, [name] 09/21/2022 8:15 A M Medical Record Number: [id number] Patient Account Number: [id number] Date of Birth/Sex: Treating RN: 26-Aug-1986 (36 y.o. [name] Primary Care Consepcion Utt: [place], [place] Other Clinician: Referring Win Guajardo: Treating  Flay Ghosh/Extender: Graciela Husbands, [name]: 350 Wound Status Wound Number: [id number] Primary Etiology: Trauma, Other Wound Location: Right, Dorsal Foot Wound Status: Open Wounding Event: Gradually Appeared Comorbid History: Sleep Apnea, Hypertension, Paraplegia Date Acquired: 03/27/2021 Weeks Of Treatment: 77 Clustered Wound: No Wound Measurements Bocock, Yousaf E ([id number]) Length: (cm) 0.3 Width: (cm) 0.3 Depth: (cm) 0.1 Area: (cm) 0.071 Volume: (cm) 0.007 [id number] Page 8 of 17 % Reduction in Area: 96.2% % Reduction in Volume: 96.3% Epithelialization: Medium (34-66%) Tunneling: No Undermining: No Wound Description Classification: Full Thickness Without Exposed Suppor Wound Margin: Flat and Intact Exudate Amount: Medium Exudate Type: Serosanguineous Exudate Color: red, brown t Structures Foul Odor After Cleansing:  No Slough/Fibrino Yes Wound Bed Granulation Amount: Large (67-100%) Exposed Structure Granulation Quality: Red Fascia Exposed: No Necrotic Amount: None Present (0%) Fat Layer (Subcutaneous Tissue) Exposed: Yes Tendon Exposed: No Muscle Exposed: No Joint Exposed: No Bone Exposed: No Periwound Skin Texture Texture Color No Abnormalities Noted: No No Abnormalities Noted: Yes Callus: No Temperature / Pain Crepitus: No Temperature: No Abnormality Excoriation: No Induration: No Rash: No Scarring: No Moisture No Abnormalities Noted: No Dry / Scaly: Yes Maceration: No Electronic Signature(s) Signed: 09/21/2022 5:36:35 PM By: Baruch Gouty RN, BSN Entered By: Baruch Gouty on 09/21/2022 08:26:06 -------------------------------------------------------------------------------- Wound Assessment Details Patient Name: Date of Service: Amalfitano, [name] 09/21/2022 8:15 A M Medical Record Number: [id number] Patient Account Number: [id number] Date of Birth/Sex: Treating RN: 02-13-1986 (36 y.o. [name] Primary Care Dustin Bumbaugh: [place], [place] Other Clinician: Referring Kyrin Gratz: Treating Bradin Mcadory/Extender: Graciela Husbands, [name]: 350 Wound Status Wound Number: [id number] Primary Etiology: Trauma, Other Wound Location: Right, Dorsal Foot Wound Status: Open Wounding Event: Gradually Appeared Comorbid History: Sleep Apnea, Hypertension, Paraplegia Date Acquired: 03/27/2021 Weeks Of Treatment: 77 Clustered Wound: No Photos Duzan, Keelon E ([id number]) [id number] Page 9 of 17 Wound Measurements Length: (cm) 0.3 Width: (cm) 0.3 Depth: (cm) 0.1 Area: (cm) 0.071 Volume: (cm) 0.007 % Reduction in Area: 96.2% % Reduction in Volume: 96.3% Wound Description Classification: Full Thickness Without Exposed Suppor Exudate Amount: None Present t Structures Periwound Skin Texture Texture Color No Abnormalities Noted: No No Abnormalities Noted: No Moisture No Abnormalities Noted: No Treatment Notes Wound #52 (Foot) Wound Laterality: Dorsal, Right Cleanser Soap and Water Discharge Instruction: May shower and wash wound with dial antibacterial soap and water prior to dressing change. Peri-Wound Care Sween Lotion (Moisturizing lotion) Discharge Instruction: Apply Aquaphor moisturizing lotion as directed Lotrisone Discharge Instruction: or antifungal spray, Apply Lotrisone to periwound Topical Primary Dressing KerraCel Ag Gelling Fiber Dressing, 2x2 in (silver alginate) Discharge Instruction: Apply silver alginate to wound bed as instructed Secondary Dressing Woven Gauze Sponge, Non-Sterile 4x4 in Discharge Instruction: Apply over primary dressing as directed. Secured With [employer], 4.5x3.1 (in/yd) Discharge Instruction: Secure with Kerlix as directed. 84M Medipore H Soft Cloth Surgical T ape, 4 x 10 (in/yd) Discharge Instruction: Secure with tape as directed. Compression Wrap Compression Stockings Circaid Juxta Lite  Compression Wrap Quantity: 1 Right Leg Compression Amount: 30-40 mmHg Discharge Instruction: Apply Circaid Juxta Lite Compression Wrap daily as instructed. Apply first thing in the morning, remove at night before bed. Add-Ons Electronic Signature(s) Signed: 09/21/2022 10:15:59 AM By: Worthy Rancher Signed: 09/21/2022 5:36:35 PM By: Baruch Gouty RN, BSN [place], [name] ([id number]) 5:36:35 PM By: Baruch Gouty RN, BSN [phone].pdf Page 10 of 17 Signed: 09/21/2022 Entered By: Worthy Rancher on 09/21/2022 08:27:23 -------------------------------------------------------------------------------- Wound Assessment Details Patient Name: Date of Service: Carchi, [name] 09/21/2022 8:15 A M Medical Record Number: [id number] Patient Account Number: [id number] Date of Birth/Sex: Treating RN: 02/12/1986 (36 y.o. [name] Primary Care Samanda Buske: [place], [place] Other Clinician: Referring Ardelle Haliburton: Treating Vanette Noguchi/Extender: Graciela Husbands, [name]: 350 Wound Status Wound Number: 56 Primary Etiology: Neuropathic Ulcer-Non Diabetic Wound Location: Left, Dorsal Foot Wound Status: Open Wounding Event: Gradually Appeared Comorbid History: Sleep Apnea, Hypertension, Paraplegia Date Acquired: 07/11/2021 Weeks Of Treatment: 61 Clustered Wound: Yes Photos Wound Measurements Length: (cm) 0.5 Width: (cm) 0.1 Depth: (cm) 0.1 Area: (cm) 0.039 Volume: (cm) 0.004 % Reduction in Area: 99.4% % Reduction in Volume: 99.4% Epithelialization: Large (67-100%) Tunneling: No Undermining: No Wound  Description Classification: Full Thickness Without Exposed Suppo Wound Margin: Flat and Intact Exudate Amount: Small Exudate Type: Serosanguineous Exudate Color: red, brown rt Structures Foul Odor After Cleansing: No Slough/Fibrino No Wound Bed Granulation Amount: Large (67-100%) Exposed Structure Granulation Quality: Red, Pink Fascia Exposed:  No Necrotic Amount: None Present (0%) Fat Layer (Subcutaneous Tissue) Exposed: Yes Tendon Exposed: No Muscle Exposed: No Joint Exposed: No Bone Exposed: No Periwound Skin Texture Texture Color No Abnormalities Noted: Yes No Abnormalities Noted: No Erythema: Yes Moisture No Abnormalities Noted: No Temperature / Pain Dry / Scaly: Yes Temperature: Hot Maceration: No Parton, Jeral E ([id number]) [id number] Page 11 of 17 Treatment Notes Wound #56 (Foot) Wound Laterality: Dorsal, Left Cleanser Soap and Water Discharge Instruction: May shower and wash wound with dial antibacterial soap and water prior to dressing change. Peri-Wound Care Sween Lotion (Moisturizing lotion) Discharge Instruction: Apply Aquaphor moisturizing lotion as directed Lotrisone Discharge Instruction: or antifungal spray, Apply Lotrisone to periwound Topical Primary Dressing KerraCel Ag Gelling Fiber Dressing, 2x2 in (silver alginate) Discharge Instruction: Apply silver alginate to wound bed as instructed Secondary Dressing Woven Gauze Sponge, Non-Sterile 4x4 in Discharge Instruction: Apply over primary dressing as directed. Secured With [employer], 4.5x3.1 (in/yd) Discharge Instruction: Secure with Kerlix as directed. 76M Medipore H Soft Cloth Surgical T ape, 4 x 10 (in/yd) Discharge Instruction: Secure with tape as directed. Compression Wrap Compression Stockings Add-Ons Electronic Signature(s) Signed: 09/21/2022 10:15:59 AM By: Worthy Rancher Signed: 09/21/2022 5:36:35 PM By: Baruch Gouty RN, BSN Entered By: Worthy Rancher on 09/21/2022 08:28:51 -------------------------------------------------------------------------------- Wound Assessment Details Patient Name: Date of Service: Hume, [name] 09/21/2022 8:15 A M Medical Record Number: [id number] Patient Account Number: [id number] Date of Birth/Sex: Treating RN: 15-Sep-1986 (36 y.o. M) Primary Care Azel Gumina: [place],  [place] Other Clinician: Referring Jerric Oyen: Treating Imogen [name]/Extender: Graciela Husbands, [name]: 350 Wound Status Wound Number: 65 Primary Etiology: Pressure Ulcer Wound Location: Left Upper Leg Wound Status: Open Wounding Event: Pressure Injury Comorbid History: Sleep Apnea, Hypertension, Paraplegia Date Acquired: 05/24/2022 Weeks Of Treatment: 16 Clustered Wound: No Photos Sells, Carmine E ([id number]) [id number] Page 12 of 17 Wound Measurements Length: (cm) 6.5 Width: (cm) 3.5 Depth: (cm) 0.1 Area: (cm) 17.868 Volume: (cm) 1.787 % Reduction in Area: -184.4% % Reduction in Volume: -184.6% Epithelialization: Small (1-33%) Tunneling: No Undermining: No Wound Description Classification: Category/Stage III Wound Margin: Flat and Intact Exudate Amount: Large Exudate Type: Serosanguineous Exudate Color: red, brown Foul Odor After Cleansing: Yes Due to Product Use: No Slough/Fibrino No Wound Bed Granulation Amount: Large (67-100%) Exposed Structure Granulation Quality: Red, Friable Fascia Exposed: No Necrotic Amount: None Present (0%) Fat Layer (Subcutaneous Tissue) Exposed: Yes Tendon Exposed: No Muscle Exposed: No Joint Exposed: No Bone Exposed: No Periwound Skin Texture Texture Color No Abnormalities Noted: No No Abnormalities Noted: Yes Scarring: Yes Temperature / Pain Temperature: No Abnormality Moisture No Abnormalities Noted: No Dry / Scaly: Yes Maceration: No Treatment Notes Wound #65 (Upper Leg) Wound Laterality: Left Cleanser Soap and Water Discharge Instruction: May shower and wash wound with dial antibacterial soap and water prior to dressing change. Peri-Wound Care Zinc Oxide Ointment 30g tube Discharge Instruction: Apply Zinc Oxide to periwound with each dressing change Topical Keystone antibiotic compound Discharge Instruction: thin layer to wound bed, use daily once available Primary  Dressing Hydrofera Blue Classic Foam, 4x4 in Discharge Instruction: Moisten with saline prior to applying to wound bed Secondary Dressing Zetuvit Plus Silicone Border Dressing 5x5 (in/in) Discharge Instruction: Apply silicone border over  primary dressing as directed. Secured With Compression Wrap Compression Stockings Sessums, Bowe E ([id number]) [id number] Page 13 of 17 Add-Ons Electronic Signature(s) Signed: 09/21/2022 5:36:35 PM By: Baruch Gouty RN, BSN Entered By: Baruch Gouty on 09/21/2022 08:30:10 -------------------------------------------------------------------------------- Wound Assessment Details Patient Name: Date of Service: Mcculloh, [name] 09/21/2022 8:15 A M Medical Record Number: [id number] Patient Account Number: [id number] Date of Birth/Sex: Treating RN: July 02, 1986 (36 y.o. [name] Primary Care Kohen Reither: [place], [place] Other Clinician: Referring Lorilee Cafarella: Treating Khylei Wilms/Extender: Graciela Husbands, [name]: 350 Wound Status Wound Number: 66 Primary Etiology: Venous Leg Ulcer Wound Location: Right, Anterior Ankle Wound Status: Open Wounding Event: Pressure Injury Comorbid History: Sleep Apnea, Hypertension, Paraplegia Date Acquired: 06/15/2022 Weeks Of Treatment: 14 Clustered Wound: No Wound Measurements Length: (cm) 0.1 Width: (cm) 0.1 Depth: (cm) 0.1 Area: (cm) 0.008 Volume: (cm) 0.001 % Reduction in Area: 99.4% % Reduction in Volume: 99.2% Epithelialization: Large (67-100%) Tunneling: No Undermining: No Wound Description Classification: Full Thickness Without Exposed Suppor Wound Margin: Flat and Intact Exudate Amount: None Present t Structures Foul Odor After Cleansing: No Slough/Fibrino No Wound Bed Granulation Amount: None Present (0%) Exposed Structure Necrotic Amount: None Present (0%) Fascia Exposed: No Fat Layer (Subcutaneous Tissue) Exposed: Yes Tendon Exposed:  No Muscle Exposed: No Joint Exposed: No Bone Exposed: No Periwound Skin Texture Texture Color No Abnormalities Noted: No No Abnormalities Noted: Yes Scarring: Yes Temperature / Pain Temperature: No Abnormality Moisture No Abnormalities Noted: No Dry / Scaly: Yes Maceration: No Electronic Signature(s) Signed: 09/21/2022 5:36:35 PM By: Baruch Gouty RN, BSN Entered By: Baruch Gouty on 09/21/2022 08:29:09 Kobel, [name] ([id number]) [id number] Page 14 of 17 -------------------------------------------------------------------------------- Wound Assessment Details Patient Name: Date of Service: [place]ton, [name] 09/21/2022 8:15 A M Medical Record Number: [id number] Patient Account Number: [id number] Date of Birth/Sex: Treating RN: Mar 24, 1986 (36 y.o. [name] Primary Care Niki Payment: [name], [name] Other Clinician: Referring Benito Lemmerman: Treating Jamilex Bohnsack/Extender: Graciela Husbands, [name]: 350 Wound Status Wound Number: 66 Primary Etiology: Venous Leg Ulcer Wound Location: Right, Anterior Ankle Wound Status: Open Wounding Event: Pressure Injury Comorbid History: Sleep Apnea, Hypertension, Paraplegia Date Acquired: 06/15/2022 Weeks Of Treatment: 14 Clustered Wound: No Photos Wound Measurements Length: (cm) 0.1 Width: (cm) 0.1 Depth: (cm) 0.1 Area: (cm) 0.008 Volume: (cm) 0.001 % Reduction in Area: 99.4% % Reduction in Volume: 99.2% Wound Description Classification: Full Thickness Without Exposed Suppor Exudate Amount: Medium Exudate Type: Serosanguineous Exudate Color: red, brown t Structures Periwound Skin Texture Texture Color No Abnormalities Noted: No No Abnormalities Noted: No Moisture No Abnormalities Noted: No Treatment Notes Wound #66 (Ankle) Wound Laterality: Right, Anterior Cleanser Soap and Water Discharge Instruction: May shower and wash wound with dial antibacterial soap and water prior to  dressing change. Peri-Wound Care Sween Lotion (Moisturizing lotion) Discharge Instruction: Apply Aquaphor moisturizing lotion as directed Lotrisone Discharge Instruction: Apply Lotrisone to periwound Topical Primary Dressing KerraCel Ag Gelling Fiber Dressing, 2x2 in (silver alginate) Discharge Instruction: Apply silver alginate to wound bed as instructed Secondary Dressing Woven Gauze Sponge, Non-Sterile 4x4 in Discharge Instruction: Apply over primary dressing as directed. Kihn, Jamen E ([id number]) [id number] Page 15 of 17 Secured With [employer], 4.5x3.1 (in/yd) Discharge Instruction: Secure with Kerlix as directed. 72M Medipore H Soft Cloth Surgical T ape, 4 x 10 (in/yd) Discharge Instruction: Secure with tape as directed. Compression Wrap Compression Stockings Add-Ons Electronic Signature(s) Signed: 09/21/2022 10:15:59 AM By: Worthy Rancher Signed: 09/21/2022 5:36:35 PM By: Baruch Gouty RN, BSN  Entered By: Worthy Rancher on 09/21/2022 08:30:40 -------------------------------------------------------------------------------- Wound Assessment Details Patient Name: Date of Service: Abrigo, [name] 09/21/2022 8:15 A M Medical Record Number: [id number] Patient Account Number: [id number] Date of Birth/Sex: Treating RN: May 29, 1986 (36 y.o. [name] Primary Care Eldredge Veldhu[place]: [name], [name] Other Clinician: Referring Helane Briceno: Treating Jadesola Poynter/Extender: Graciela Husbands, [name]: 350 Wound Status Wound Number: 67 Primary Etiology: Venous Leg Ulcer Wound Location: Left, Lateral Lower Leg Wound Status: Open Wounding Event: Gradually Appeared Comorbid History: Sleep Apnea, Hypertension, Paraplegia Date Acquired: 06/22/2022 Weeks Of Treatment: 12 Clustered Wound: No Wound Measurements Length: (cm) 4.3 Width: (cm) 1.7 Depth: (cm) 0.1 Area: (cm) 5.741 Volume: (cm) 0.574 % Reduction in Area: -2.4% % Reduction in  Volume: -2.3% Epithelialization: None Tunneling: No Undermining: No Wound Description Classification: Full Thickness Without Exposed Support Structures Wound Margin: Distinct, outline attached Exudate Amount: Medium Exudate Type: Serosanguineous Exudate Color: red, brown Foul Odor After Cleansing: No Slough/Fibrino No Wound Bed Granulation Amount: Medium (34-66%) Exposed Structure Granulation Quality: Red Fascia Exposed: No Necrotic Amount: Medium (34-66%) Fat Layer (Subcutaneous Tissue) Exposed: Yes Necrotic Quality: Adherent Slough Tendon Exposed: No Muscle Exposed: No Joint Exposed: No Bone Exposed: No Periwound Skin Texture Texture Color No Abnormalities Noted: Yes No Abnormalities Noted: No Erythema: Yes Moisture No Abnormalities Noted: No Temperature / Pain Dry / Scaly: Yes Temperature: Hot Hurless, Raeden E ([id number]) [id number] Page 16 of 17 Electronic Signature(s) Signed: 09/21/2022 5:36:35 PM By: Baruch Gouty RN, BSN Entered By: Baruch Gouty on 09/21/2022 08:31:17 -------------------------------------------------------------------------------- Wound Assessment Details Patient Name: Date of Service: Brackens, [name] 09/21/2022 8:15 A M Medical Record Number: [id number] Patient Account Number: [id number] Date of Birth/Sex: Treating RN: 1986/11/07 (36 y.o. [name] Primary Care Brealynn Contino: [name], [name] Other Clinician: Referring Verbon Giangregorio: Treating Lodema Parma/Extender: Graciela Husbands, [name]: 350 Wound Status Wound Number: 67 Primary Etiology: Venous Leg Ulcer Wound Location: Left, Lateral Lower Leg Wound Status: Open Wounding Event: Gradually Appeared Comorbid History: Sleep Apnea, Hypertension, Paraplegia Date Acquired: 06/22/2022 Weeks Of Treatment: 12 Clustered Wound: No Photos Wound Measurements Length: (cm) 4.3 Width: (cm) 1.7 Depth: (cm) 0.1 Area: (cm) 5.741 Volume: (cm) 0.574 %  Reduction in Area: -2.4% % Reduction in Volume: -2.3% Wound Description Classification: Full Thickness Without Exposed Suppor Exudate Amount: None Present t Structures Periwound Skin Texture Texture Color No Abnormalities Noted: No No Abnormalities Noted: No Moisture No Abnormalities Noted: No Treatment Notes Wound #67 (Lower Leg) Wound Laterality: Left, Lateral Cleanser Soap and Water Discharge Instruction: May shower and wash wound with dial antibacterial soap and water prior to dressing change. Peri-Wound Care Triamcinolone 15 (g) Discharge Instruction: Use triamcinolone 15 (g) as directed Sween Lotion (Moisturizing lotion) Discharge Instruction: Apply Aquaphor moisturizing lotion as directed Lacher, Zerrick E ([id number]) [id number] Page 57 of 17 Lotrisone Discharge Instruction: Apply Lotrisone to periwound Topical Primary Dressing KerraCel Ag Gelling Fiber Dressing, 2x2 in (silver alginate) Discharge Instruction: Apply silver alginate to wound bed as instructed Secondary Dressing Woven Gauze Sponge, Non-Sterile 4x4 in Discharge Instruction: Apply over primary dressing as directed. Secured With [employer], 4.5x3.1 (in/yd) Discharge Instruction: Secure with Kerlix as directed. 58M Medipore H Soft Cloth Surgical T ape, 4 x 10 (in/yd) Discharge Instruction: Secure with tape as directed. Compression Wrap Compression Stockings Add-Ons Electronic Signature(s) Signed: 09/21/2022 10:15:59 AM By: Worthy Rancher Signed: 09/21/2022 5:36:35 PM By: Baruch Gouty RN, BSN Entered By: Worthy Rancher on 09/21/2022 08:32:01 -------------------------------------------------------------------------------- Vitals Details Patient Name: Date of Service: Kovar, A  [name] 09/21/2022 8:15 A M Medical Record Number: [id number] Patient Account Number: [id number] Date of Birth/Sex: Treating RN: Oct 14, 1986 (36 y.o. M) Primary Care Liset Mcmonigle: [place], [place] Other  Clinician: Referring Brinn Westby: Treating Maebelle Sulton/Extender: Graciela Husbands, [name]: 350 Vital Signs Time Taken: 08:05 Temperature (F): 98.4 Height (in): 70 Pulse (bpm): 105 Weight (lbs): 216 Respiratory Rate (breaths/min): 20 Body Mass Index (BMI): 31 Blood Pressure (mmHg): 137/77 Reference Range: 80 - 120 mg / dl Electronic Signature(s) Signed: 09/21/2022 10:15:59 AM By: Worthy Rancher Entered By: Worthy Rancher on 09/21/2022 08:12:17

## 2022-09-22 NOTE — Progress Notes (Signed)
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 there is an element of venous inflammation as well. 01/17/17. The erythema is present but better in the left leg. His left lateral leg wound still does not have [name] viable surface buttons certain parts of this long thin wound it appears like there has been improvement in dimensions. 01/20/17; the erythema still present but much better in the left leg. I'm thinking this is his usual degree of chronic venous inflammation. The wound on the left leg looks somewhat better. Is less surface slough 01/27/17; erythema is back to the chronic venous inflammation. The wound on the left leg is somewhat better. I am back to the point where I like to try an [name], [name] ([id number]) [id number] Page 6 of 37 Apligraf once again 02/10/17; slight improvement in wound dimensions. Apligraf #2. He is completing his doxycycline 02/14/17; patient arrives today having completed doxycycline last Thursday. This was supposed to be [name] nurse visit however once again he hasn't tense erythema from the medial part of his wound extending over the lower leg. Also erythema in his foot this is roughly in the same distribution as last time. He has baseline chronic venous inflammation however this is [name] lot worse than the baseline I have learned to accept the on him is baseline  inflammation 02/24/17- patient is here for follow-up evaluation. He is tolerating compression therapy. His voicing no complaints or concerns he is here anticipating an Apligraf 03/03/17; he arrives today with an adherent necrotic surface. I don't think this is surface is going to be amenable for Apligraf's. The erythema around his wound and on the left dorsal foot has resolved he is off antibiotics 03/10/17; better-looking surface today. I don't think he can tolerate Apligraf's. He tells me he had [name] wound VAC after [name] skin graft years ago to this area and they had difficulty with [name] seal. The erythema continues to be stable around this some degree of chronic venous inflammation but he also has recurrent cellulitis. We have been using Iodoflex 03/17/17; continued improvement in the surface and may be small changes in dimensions. Using Iodoflex which seems the only thing that will control his surface 03/24/17- He is here for follow up evaluation of his LLE lateral ulceration and ulcer to right dorsal foot/toe space. He is voicing no complaints or concerns, He is tolerating compression wrap. 03/31/17 arrives today with [name] much healthier looking wound on the left lower extremity. We have been using Iodoflex for [name] prolonged period of  time which has for the first time prepared and adequate looking wound bed although we have not had much in the way of wound dimension improvement. He also has [name] small wound between the first and second toe on the right 04/07/17; arrives today with [name] healthy-looking wound bed and at least the top 50% of this wound appears to be now her. No debridement was required I have changed him to [facility] last week after prolonged Iodoflex. He did not do well with Apligraf's. We've had [name] re-opening between the first and second toe on the right 04/14/17; arrives today with [name] healthier looking wound bed contractions and the top 50% of this wound and some on the lesser 50%. Wound bed appears  healthy. The area between the first and second toe on the right still remains problematic 04/21/17; continued very gradual improvement. Using [facility] 04/28/17; continued very gradual improvement in the left lateral leg venous insufficiency wound. His periwound erythema is very mild. We have been using Hydrofera Blue. Wound is making progress especially in the superior 50% 05/05/17; he continues to have very gradual improvement in the left lateral venous insufficiency wound. Both in terms with an length rings are improving. I debrided this every 2 [name] with #5 curet and we have been using Hydrofera Blue and again making good progress With regards to the wounds between his right first and second toe which I thought might of been tinea pedis he is not making as much progress very dry scaly skin over the area. Also the area at the base of the left first and second toe in [name] similar condition 05/12/17; continued gradual improvement in the refractory left lateral venous insufficiency wound on the left. Dimension smaller. Surface still requiring debridement using Hydrofera Blue 05/19/17; continued gradual improvement in the refractory left lateral venous ulceration. Careful inspection of the wound bed underlying rumination suggested some degree of epithelialization over the surface no debridement indicated. Continue Hydrofera Blue difficult areas between his toes first and third on the left than first and second on the right. I'm going to change to silver alginate from silver collagen. Continue ketoconazole as I suspect underlying tinea pedis 05/26/17; left lateral leg venous insufficiency wound. We've been using Hydrofera Blue. I believe that there is expanding epithelialization over the surface of the wound albeit not coming from the wound circumference. This is [name] bit of an odd situation in which the epithelialization seems to be coming from the surface of the wound rather than in the exact circumference.  There is still small open areas mostly along the lateral margin of the wound. He has unchanged areas between the left first and second and the right first second toes which I been treating for tenia pedis 06/02/17; left lateral leg venous insufficiency wound. We have been using Hydrofera Blue. Somewhat smaller from the wound circumference. The surface of the wound remains [name] bit on it almost epithelialized sedation in appearance. I use an open curette today debridement in the surface of all of this especially the edges Small open wounds remaining on the dorsal right first and second toe interspace and the plantar left first second toe and her face on the left 06/09/17; wound on the left lateral leg continues to be smaller but very gradual and very dry surface using Hydrofera Blue 06/16/17 requires weekly debridements now on the left lateral leg although this continues to contract. I changed to silver collagen last week because of dryness of the wound bed. Using Iodoflex to  the areas on his first and second toes/web space bilaterally 06/24/17; patient with history of paraplegia also chronic venous insufficiency with lymphedema. Has [name] very difficult wound on the left lateral leg. This has been gradually reducing in terms of with but comes in with [name] very dry adherent surface. High switch to silver collagen [name] week or so ago with hydrogel to keep the area moist. This is been refractory to multiple dressing attempts. He also has areas in his first and second toes bilaterally in the anterior and posterior web space. I had been using Iodoflex here after [name] prolonged course of silver alginate with ketoconazole was ineffective [question tinea pedis] 07/14/17; patient arrives today with [name] very difficult adherent material over his left lateral lower leg wound. He also has surrounding erythema and poorly controlled edema. He was switched his Santyl last visit which the nurses are applying once during his doctor visit and  once on [name] nurse visit. He was also reduced to 2 layer compression I'm not exactly sure of the issue here. 07/21/17; better surface today after 1 week of Iodoflex. Significant cellulitis that we treated last week also better. [Doxycycline] 07/28/17 better surface today with now 2 [name] of Iodoflex. Significant cellulitis treated with doxycycline. He has now completed the doxycycline and he is back to his usual degree of chronic venous inflammation/stasis dermatitis. He reminds me he has had ablations surgery here 08/04/17; continued improvement with Iodoflex to the left lateral leg wound in terms of the surface of the wound although the dimensions are better. 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 Problematic areas on the plantar aspect of the first second toe web space on the left and the dorsal aspect of the first second toe web space on the right. At one point I felt these were probably related to chronic fungal infections in treated him aggressively for this although we have not made any improvement here. 08/11/17; left lateral leg. Surface continues to improve with the Iodoflex although we are not seeing much improvement in overall wound dimensions. Areas on his plantar left foot and right foot show no improvement. In fact the right foot looks somewhat worse 08/18/17; left lateral leg. We changed to [facility] Blue last week after [name] prolonged course of Iodoflex which helps get the surface better. It appears that the wound with is improved. Continue with difficult areas on the left dorsal first second and plantar first second on the right 09/01/17; patient arrives in clinic today having had [name] temperature of 103 yesterday. He was seen in the ER and [facility] The patient was concerned he could have cellulitis again in the right leg however they diagnosed him with [name] UTI and he is now on Keflex. He has [name] history of cellulitis which is  been recurrent and difficult but this is been in the left leg, in the past 5 use doxycycline. He does in and out catheterizations at home which are risk factors for UTI 09/08/17; patient will be completing his Keflex this weekend. The erythema on the left leg is considerably better. He has [name] new wound today on the medial part of the right leg small superficial almost looks like [name] skin tear. He has worsening of the area on the right dorsal first and second toe. His major area on the left lateral leg is better. Using Hydrofera Blue on all areas 09/15/17; gradual reduction in width on the long wound in the left lateral  leg. No debridement required. He also has wounds on the plantar aspect of his left first second toe web space and on the dorsal aspect of the right first second toe web space. 09/22/17; there continues to be very gradual improvements in the dimensions of the left lateral leg wound. He hasn't round erythematous spot with might be pressure on his wheelchair. There is no evidence obviously of infection no purulence no warmth He has [name] dry scaled area on the plantar aspect of the left first second toe Improved area on the dorsal right first second toe. 09/29/17; left lateral leg wound continues to improve in dimensions mostly with an is still [name] fairly long but increasingly narrow wound. He has [name] dry scaled area on the plantar aspect of his left first second toe web space Increasingly concerning area on the dorsal right first second toe. In fact I am concerned today about possible cellulitis around this wound. The areas extending up his second toe and although there is deformities here almost appears to abut on the nailbed. 10/06/17; left lateral leg wound continues to make very gradual progress. Tissue culture I did from the right first second toe dorsal foot last time grew MRSA and enterococcus which was vancomycin sensitive. This was not sensitive to clindamycin or doxycycline. He is allergic  to Zyvox and sulfa we have therefore arrange for him to have dalvance infusion tomorrow. He is had this in the past and tolerated it well 10/20/17; left lateral leg wound continues to make decent progress. This is certainly reduced in terms of with there is advancing epithelialization.The cellulitis in the right foot looks better although he still has [name] deep wound in the dorsal aspect of the first second toe web space. Plantar left first toe web space on the left I think is making some progress 10/27/17; left lateral leg wound continues to make decent progress. Advancing epithelialization.using Hydrofera Blue The right first second toe web space wound is better-looking using silver alginate Improvement in the left plantar first second toe web space. Again using silver alginate 11/03/17 left lateral leg wound continues to make decent progress albeit slowly. Using [facility] [name], [name] ([id number]) [id number] Page 7 of 37 The right per second toe web space continues to be [name] very problematic looking punched out wound. I obtained [name] piece of tissue for deep culture I did extensively treated this for fungus. It is difficult to imagine that this is [name] pressure area as the patient states other than going outside he doesn't really wear shoes at home The left plantar first second toe web space looked fairly senescent. Necrotic edges. This required debridement change to [facility] Blue to all wound areas 11/10/17; left lateral leg wound continues to contract. Using Hydrofera Blue On the right dorsal first second toe web space dorsally. Culture I did of this area last week grew MRSA there is not an easy oral option in this patient was multiple antibiotic allergies or intolerances. This was only [name] rare culture isolate I'm therefore going to use Bactroban under silver alginate On the left plantar first second toe web space. Debridement is required here. This is also  unchanged 11/17/17; left lateral leg wound continues to contract using Hydrofera Blue this is no longer the major issue. The major concern here is the right first second toe web space. He now has an open area going from dorsally to the plantar aspect. There is now wound on the inner lateral part of the first toe. Not [name]  very viable surface on this. There is erythema spreading medially into the forefoot. No major change in the left first second toe plantar wound 11/24/17; left lateral leg wound continues to contract using Hydrofera Blue. Nice improvement today The right first second toe web space all of this looks [name] lot less angry than last week. I have given him clindamycin and topical Bactroban for MRSA and terbinafine for the possibility of underlining tinea pedis that I could not control with ketoconazole. Looks somewhat better The area on the plantar left first second toe web space is weeping with dried debris around the wound 12/01/17; left lateral leg wound continues to contract he Hydrofera Blue. It is becoming thinner in terms of with nevertheless it is making good improvement. The right first second toe web space looks less angry but still [name] large necrotic-looking wounds starting on the plantar aspect of the right foot extending between the toes and now extensively on the base of the right second toe. I gave him clindamycin and topical Bactroban for MRSA anterior benefiting for the possibility of underlying tinea pedis. Not looking better today The area on the left first/second toe looks better. Debrided of necrotic debris 12/05/17* the patient was worked in urgently today because over the weekend he found blood on his incontinence bad when he woke up. He was found to have an ulcer by his wife who does most of his wound care. He came in today for [place] to look at this. He has not had [name] history of wounds in his buttocks in spite of his paraplegia. 12/08/17; seen in follow-up today at his usual  appointment. He was seen earlier this week and found to have [name] new wound on his buttock. We also follow him for wounds on the left lateral leg, left first second toe web space and right first second toe web space 12/15/17; we have been using Hydrofera Blue to the left lateral leg which has improved. The right first second toe web space has also improved. Left first second toe web space plantar aspect looks stable. The left buttock has worsened using Santyl. Apparently the buttock has drainage 12/22/17; we have been using Hydrofera Blue to the left lateral leg which continues to improve now 2 small wounds separated by normal skin. He tells [place] he had [name] fever up to 100 yesterday he is prone to UTIs but has not noted anything different. He does in and out catheterizations. The area between the first and second toes today does not look good necrotic surface covered with what looks to be purulent drainage and erythema extending into the third toe. I had gotten this to something that I thought look better last time however it is not look good today. He also has [name] necrotic surface over the buttock wound which is expanded. I thought there might be infection under here so I removed [name] lot of the surface with [name] #5 curet though nothing look like it really needed culturing. He is been using Santyl to this area 12/27/17; his original wound on the left lateral leg continues to improve using Hydrofera Blue. I gave him samples of Baxdella although he was unable to take them out of fear for an allergic reaction ["lump in his throat"].the culture I did of the purulent drainage from his second toe last week showed both enterococcus and [name] set Enterobacter I was also concerned about the erythema on the bottom of his foot although paradoxically although this looks somewhat better today. 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 We've  been using silver alginate here. They came up today that he sleeps in [name] recliner, I'm not sure why but I asked him to stop this 01/03/18; his original wound we've been using Hydrofera Blue is now separated into 2 areas. Ulcer on his left buttock is better he is off the recliner and sleeping in bed Finally both wound areas between his first and second toes also looks some better 01/10/18; his original wound on the left lateral leg is now separated into 2 wounds we've been using Hydrofera Blue Ulcer on his left buttock has some drainage. There is [name] small probing site going into muscle layer superiorly.using silver alginate -He arrives today with [name] deep tissue injury on the left heel The wound on the dorsal aspect of his first second toe on the left looks [name] lot betterusing silver alginate ketoconazole The area on the first second toe web space on the right also looks [name] lot bette 01/17/18; his original wound on the left lateral leg continues to progress using Hydrofera Blue Ulcer on his left buttock also is smaller surface healthier except for [name] small probing site going into the muscle layer superiorly. 2.4 cm of tunneling in this area DTI on his left heel we have only been offloading. Looks better than last week no threatened open no evidence of infection the wound on the dorsal aspect of the first second toe on the left continues to look like it's regressing we have only been using silver alginate and terbinafine orally The area in the first second toe web space on the right also looks to be [name] lot better using silver alginate and terbinafine I think this was prompted by tinea pedis 01/31/18; the patient was hospitalized in [place] last week apparently for [name] complicated UTI. He was discharged on cefepime he does in and out catheterizations. In the hospital he was discovered M I don't mild elevation of AST and ALT and the terbinafine was stopped.predictably the pressure ulcer on s his buttock looks  betterusing silver alginate. The area on the left lateral leg also is better using Hydrofera Blue. The area between the first and second toes on the left better. First and second toes on the right still substantial but better. Finally the DTI on the left heel has held together and looks like it's resolving 02/07/18-he is here in follow-up evaluation for multiple ulcerations. He has new injury to the lateral aspect of the last issue [name] pressure ulcer, he states this is from adhesive removal trauma. He states he has tried multiple adhesive products with no success. All other ulcers appear stable. The left heel DTI is resolving. We will continue with same treatment plan and follow-up next week. 02/14/18; follow-up for multiple areas. He has [name] new area last week on the lateral aspect of his pressure ulcer more over the posterior trochanter. The original pressure ulcer looks quite stable has healthy granulation. 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 Almost fully epithelialized on the original superior area using Hydrofera Blue DTI on the left heel has peeled off this week to reveal [name] small superficial wound under denuded skin and subcutaneous tissue Both areas between the first and second toes look better including nothing open on the left 02/21/18; The patient's wounds on his left ischial  tuberosity and posterior left greater trochanter actually looked better. He has [name] large area of irritation around the area which I think is contact dermatitis. I am doubtful that this is fungal His original wound on the left lateral calf continues to improve we have been using Hydrofera Blue There is no open area in the left first second toe web space although there is [name] lot of thick callus The DTI on the left heel required debridement today of necrotic surface eschar and subcutaneous tissue using silver  alginate Finally the area on the right first second toe webspace continues to contract using silver alginate and ketoconazole 02/28/18 Left ischial tuberosity wounds look better using silver alginate. Original wound on the left calf only has one small open area left using Hydrofera Blue DTI on the left heel required debridement mostly removing skin from around this wound surface. Using silver alginate The areas on the right first/second toe web space using silver alginate and ketoconazole 03/08/18 on evaluation today patient appears to be doing decently well as best I can tell in regard to his wounds. This is the first time that I have seen him as he generally is followed by Dr. Dellia Nims. With that being said none of his wounds appear to be infected he does have an area where there is some skin covering Siravo, [place] ([id number]) [id number] Page 8 of 37 what appears to be [name] new wound on the left dorsal surface of his great toe. This is right at the nail bed. With that being said I do believe that debrided away some of the excess skin can be of benefit in this regard. Otherwise he has been tolerating the dressing changes without complication. 03/14/18; patient arrives today with the multiplicity of wounds that we are following. He has not been systemically unwell Original wound on the left lateral calf now only has 2 small open areas we've been using Hydrofera Blue which should continue The deep tissue injury on the left heel requires debridement today. We've been using silver alginate The left first second toe and the right first second toe are both are reminiscence what I think was tinea pedis. Apparently some of the callus Surface between the toes was removed last week when it started draining. Purulent drainage coming from the wound on the ischial tuberosity on the left. 03/21/18-He is here in follow-up evaluation for multiple wounds. 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 He tolerated debridement. The only change to last week's recommendations is to discontinue antifungal cream between toes. He will follow-up next week 03/28/18; following up for multiple wounds;Concern this week is streaking redness and swelling in the right foot. He is going to need antibiotics for this. 03/31/18; follow-up for right foot cellulitis. Streaking redness and swelling in the right foot on 03/28/18. 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 I put him on clindamycin 300 mg every 6 and brought him in for [name] quick check. He has an open wound between his first and second toes on the right foot as [name] potential source. 04/04/18; Right foot cellulitis is resolving he is completing clindamycin. This is truly good news Left lateral calf wound which is initial wound only has one small open area inferiorly this is close to healing out. He has compression stockings. We will use Hydrofera Blue right down to the epithelialization of this Nonviable surface on the left heel which was initially pressure with [name] DTI.  We've been using Hydrofera Blue. I'm going to switch this back to silver alginate Left first second toe/tinea pedis this looks better using silver alginate Right first second toe tinea pedis using silver alginate Large pressure ulcers on theLeft ischial tuberosity. Small wound here Looks better. I am uncertain about the surface over the large wound. Using silver alginate 04/11/18; Cellulitis in the right foot is resolved Left lateral calf wound which was his original wounds still has 2 tiny open areas remaining this is just about closed Nonviable surface on the left heel is better but still requires debridement Left first second toe/tinea pedis still open using silver alginate Right first second toe wound tinea pedis I asked him to go back to using ketoconazole and silver alginate Large pressure  ulcers on the left ischial tuberosity this shear injury here is resolved. Wound is smaller. No evidence of infection using silver alginate 04/18/18; Patient arrives with an intense area of cellulitis in the right mid lower calf extending into the right heel area. Bright red and warm. Smaller area on the left anterior leg. He has [name] significant history of MRSA. He will definitely need antibioticsdoxycycline He now has 2 open areas on the left ischial tuberosity the original large wound and now [name] satellite area which I think was above his initial satellite areas. Not [name] wonderful surface on this satellite area surrounding erythema which looks like pressure related. His left lateral calf wound again his original wound is just about closed Left heel pressure injury still requiring debridement Left first second toe looks [name] lot better using silver alginate Right first second toe also using silver alginate and ketoconazole cream also looks better 04/20/18; the patient was worked in early today out of concerns with his cellulitis on the right leg. I had started him on doxycycline. This was 2 days ago. His wife was concerned about the swelling in the area. Also concerned about the left buttock. He has not been systemically unwell no fever chills. No nausea vomiting or diarrhea 04/25/18; the patient's left buttock wound is continued to deteriorate he is using Hydrofera Blue. He is still completing clindamycin for the cellulitis on the right leg although all of this looks better. 05/02/18 Left buttock wound still with [name] lot of drainage and [name] very tightly adherent fibrinous necrotic surface. He has [name] deeper area superiorly The left lateral calf wound is still closed DTI wound on the left heel necrotic surface especially the circumference using Iodoflex Areas between his left first second toe and right first second toe both look better. Dorsally and the right first second toe he had [name] necrotic surface although at  smaller. In using silver alginate and ketoconazole. I did [name] culture last week which was [name] deep tissue culture of the reminiscence of the open wound on the right first second toe dorsally. This grew [name] few Acinetobacter and [name] few methicillin-resistant staph aureus. Nevertheless the area actually this week looked better. I didn't feel the need to specifically address this at least in terms of systemic antibiotics. 05/09/18; wounds are measuring larger more drainage per our intake. We are using Santyl covered with alginate on the large superficial buttock wounds, Iodosorb on the left heel, ketoconazole and silver alginate to the dorsal first and second toes bilaterally. 05/16/18; The area on his left buttock better in some aspects although the area superiorly over the ischial tuberosity required an extensive debridement.using Santyl Left heel appears stable. Using Iodoflex The areas between his first and second toes  are not bad however there is spreading erythema up the dorsal aspect of his left foot this looks like cellulitis again. He is insensate the erythema is really very brilliant.o Erysipelas He went to see an allergist days ago because he was itching part of this he had lab work done. This showed [name] white count of 15.1 with 70% neutrophils. Hemoglobin of 11.4 and [name] platelet count of 659,000. Last white count we had in Epic was [name] 2-1/2 years ago which was 25.9 but he was ill at the time. He was able to show me some lab work that was done by his primary physician the pattern is about the same. 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 But prompted me to go ahead and do x-rays of both feet and the pelvis rule out osteomyelitis. He also had [name] comprehensive metabolic panel this was reasonably normal his albumin was 3.7 liver function tests BUN/creatinine all normal 05/23/18; x-rays of both his feet from last week were negative for underlying pulmonary  abnormality. The x-ray of his pelvis however showed mild irregularity in the left ischial which may represent some early osteomyelitis. The wound in the left ischial continues to get deeper clearly now exposed muscle. Each week necrotic surface material over this area. Whereas the rest of the wounds do not look so bad. The left ischial wound we have been using Santyl and calcium alginate T the left heel surface necrotic debris using Iodoflex o The left lateral leg is still healed Areas on the left dorsal foot and the right dorsal foot are about the same. There is some inflammation on the left which might represent contact dermatitis, fungal dermatitis I am doubtful cellulitis although this looks better than last week 05/30/18; CT scan done at Hospital did not show any osteomyelitis or abscess. Suggested the possibility of underlying cellulitis although I don't see [name] lot of evidence of this at the bedside The wound itself on the left buttock/upper thigh actually looks somewhat better. No debridement Left heel also looks better no debridement continue Iodoflex Both dorsal first second toe spaces appear better using Lotrisone. Left still required debridement 06/06/18; Intake reported some purulent looking drainage from the left gluteal wound. Using Santyl and calcium alginate Left heel looks better although still [name] nonviable surface requiring debridement The left dorsal foot first/second webspace actually expanding and somewhat deeper. I may consider doing [name] shave biopsy of this area Right dorsal foot first/second webspace appears stable to improved. Using Lotrisone and silver alginate to both these areas 06/13/18 Left gluteal surface looks better. Now separated in the 2 wounds. No debridement required. Still drainage. We'll continue silver alginate Left heel continues to look better with Iodoflex continue this for at least another week Of his dorsal foot wounds the area on the left still has some  depth although it looks better than last week. We've been using Lotrisone and silver alginate 06/20/18 Left gluteal continues to look better healthy tissue Maxfield, Jevin E ([id number]) [id number] Page 9 of 37 Left heel continues to look better healthy granulation wound is smaller. He is using Iodoflex and his long as this continues continue the Iodoflex Dorsal right foot looks better unfortunately dorsal left foot does not. There is swelling and erythema of his forefoot. He had minor trauma to this several days ago but doesn't think this was enough to have caused any tissue injury. Foot looks like cellulitis, we have had this problem before 06/27/18  on evaluation today patient appears to be doing [name] little worse in regard to his foot ulcer. 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 Both of which would really be his only options for treating this infection. In the past he has been given and effusion of Orbactiv. This is done very well for him in the past again it's one time dosing IV antibiotic therapy. Subsequently I do believe this is something we're gonna need to see about doing at this point in time. Currently his other wounds seem to be doing somewhat better in my pinion I'm pretty happy in that regard. 07/03/18 on evaluation today patient's wounds actually appear to be doing fairly well. He has been tolerating the dressing changes without complication. All in all he seems to be showing signs of improvement. In regard to the antibiotics he has been dealing with infectious disease since I saw him last week as far as getting this scheduled. In the end [facility]'s [facility] this coming Friday. In the meantime he has been continuing to perform the dressing changes in such as previous.  There does not appear to be any evidence of infection worsengin at this time. 07/10/18; Since I last saw this man 2 [name] ago things have actually improved. IV antibiotics of resulted in less forefoot erythema although there is still some present. He is not systemically unwell Left buttock wounds 2 now have no depth there is increased epithelialization Using silver alginate Left heel still requires debridement using Iodoflex Left dorsal foot still with [name] sizable wound about the size of [name] border but healthy granulation Right dorsal foot still with [name] slitlike area using silver alginate 07/18/18; the patient's cellulitis in the left foot is improved in fact I think it is on its way to resolving. Left buttock wounds 2 both look better although the larger one has hypertension granulation we've been using silver alginate Left heel has some thick circumferential redundant skin over the wound edge which will need to be removed today we've been using Iodoflex Left dorsal foot is still [name] sizable wound required debridement using silver alginate The right dorsal foot is just about closed only [name] small open area remains here 07/25/18; left foot cellulitis is resolved Left buttock wounds 2 both look better. Hyper-granulation on the major area Left heel as some debris over the surface but otherwise looks [name] healthier wound. Using silver collagen Right dorsal foot is just about closed 07/31/18; arrives with our intake nurse worried about purulent drainage from the buttock. We had hyper-granulation here last week His buttock wounds 2 continue to look better Left heel some debris over the surface but measuring smaller. Right dorsal foot unfortunately has openings between the toes Left foot superficial wound looks less aggravated. 08/07/18 Buttock wounds continue to look better although some of her granulation and the larger medial wound. silver alginate Left heel continues to look [name] lot better.silver collagen Left  foot superficial wound looks less stable. Requires debridement. He has [name] new wound superficial area on the foot on the lateral dorsal foot. Right foot looks better using silver alginate without Lotrisone 08/14/2018; patient was in the ER last week diagnosed with [name] UTI. He is now on Cefpodoxime and Macrodantin. Buttock wounds continued to be smaller. Using silver alginate Left heel continues to  look better using silver collagen Left foot superficial wound looks as though it is improving Right dorsal foot area is just about healed. 08/21/2018; patient is completed his antibiotics for his UTI. He has 2 open areas on the buttocks. There is still not closed although the surface looks satisfactory. Using silver alginate Left heel continues to improve using silver collagen The bilateral dorsal foot areas which are at the base of his first and second toes/possible tinea pedis are actually stable on the left but worse on the right. The area on the left required debridement of necrotic surface. After debridement I obtained [name] specimen for PCR culture. The right dorsal foot which is been just about healed last week is now reopened 08/28/2018; culture done on the left dorsal foot showed coag negative staph both staph epidermidis and Lugdunensis. I think this is worthwhile initiating systemic treatment. I will use doxycycline given his long list of allergies. The area on the left heel slightly improved but still requiring debridement. The large wound on the buttock is just about closed whereas the smaller one is larger. Using silver alginate in this area 09/04/2018; patient is completing his doxycycline for the left foot although this continues to be [name] very difficult wound area with very adherent necrotic debris. We are using silver alginate to all his wounds right foot left foot and the small wounds on his buttock, silver collagen on the left heel. 09/11/2018; once again this patient has intense erythema and  swelling of the left forefoot. Lesser degrees of erythema in the right foot. He has [name] long list of allergies and intolerances. I will reinstitute doxycycline. 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 Using silver alginate Left heel also looks better using silver collagen Unfortunately both the areas on his feet look worse. The area on the left first second webspace is now gone through to the plantar part of his foot. The area on the left foot anteriorly is irritated with erythema and swelling in the forefoot. 09/25/2018 His wound on the left plantar heel looks better. Using silver collagen The area on the left buttock 2 small remnant areas. One is closed one is still open. Using silver alginate The areas between both his first and second toes look worse. This in spite of long-standing antifungal therapy with ketoconazole and silver alginate which should have antifungal activity He has small areas around his original wound on the left calf one is on the bottom of the original scar tissue and one superiorly both of these are small and superficial but again given wound history in this site this is worrisome 10/02/2018 Left plantar heel continues to gradually contract using silver collagen Left buttock wound is unchanged using silver alginate The areas on his dorsal feet between his first and second toes bilaterally look about the same. I prescribed clindamycin ointment to see if we can address chronic staph colonization and also the underlying possibility of erythrasma The left lateral lower extremity wound is actually on the lateral part of his ankle. Small open area here. We have been using silver alginate 10/09/2018; Left plantar heel continues to look healthy and contract. No debridement is required Left buttock slightly smaller with [name] tape injury wound just below which was new this week Dorsal feet somewhat improved I have been using  clindamycin Left lateral looks lower extremity the actual open area looks worse although [name] lot of this is epithelialized. I am going to  change to silver collagen today He has [name] lot more swelling in the right leg although this is not pitting not red and not particularly warm there is [name] lot of spasm in the right leg usually indicative of people with paralysis of some underlying discomfort. We have reviewed his vascular status from 2017 he had [name] left greater saphenous vein ablation. I wonder about referring him back to vascular surgery if the area on the left leg continues to deteriorate. 10/16/2018 in today for follow-up and management of multiple lower extremity ulcers. His left Buttock wound is much lower smaller and almost closed completely. The wound to the left ankle has began to reopen with Epithelialization and some adherent slough. He has multiple new areas to the left foot and leg. The left dorsal foot without much improvement. Wound present between left great webspace and 2nd toe. Erythema and edema present right leg. Right LE Martin, Marques E ([id number]) [id number] Page 10 of 37 ultrasound obtained on 10/10/18 was negative for DVT. 10/23/2018; Left buttock is closed over. Still dry macerated skin but there is no open wound. I suspect this is chronic pressure/moisture Left lateral calf is quite [name] bit worse than when I saw this last. There is clearly drainage here he has macerated skin into the left plantar heel. We will change the primary dressing to alginate Left dorsal foot has some improvement in overall wound area. Still using clindamycin and silver alginate Right dorsal foot about the same as the left using clindamycin and silver alginate The erythema in the right leg has resolved. He is DVT rule out was negative Left heel pressure area required debridement although the wound is smaller and the surface is health 10/26/2018 The patient came back in for his  nurse check today predominantly because of the drainage coming out of the left lateral leg with [name] recent reopening of his original wound on the left lateral calf. He comes in today with [name] large amount of surrounding erythema around the wound extending from the calf into the ankle and even in the area on the dorsal foot. He is not systemically unwell. He is not febrile. Nevertheless this looks like cellulitis. We have been using silver alginate to the area. I changed him to [name] regular visit and I am going to prescribe him doxycycline. The rationale here is [name] long list of medication intolerances and [name] history of MRSA. I did not see anything that I thought would provide [name] valuable culture 10/30/2018 Follow-up from his appointment 4 days ago with really an extensive area of cellulitis in the left calf left lateral ankle and left dorsal foot. I put him on doxycycline. He has [name] long list of medication allergies which are true allergy reactions. 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 In any case he is [name] lot better today. The erythema especially in the anterior and lateral left calf is better. He still has left ankle erythema. He also is complaining about increasing edema in the right leg we have only been using Kerlix Coban and he has been doing the wraps at home. Finally he has [name] spotty rash on the medial part of his upper left calf which looks like folliculitis or perhaps wrap occlusion type injury. Small superficial macules not pustules 11/06/18 patient arrives today with again [name] considerable degree of erythema around the wound on the left lateral calf extending into the dorsal ankle and dorsal foot. This is  [name] lot worse than when I saw this last week. He is on doxycycline really with not [name] lot of improvement. He has not been systemically unwell Wounds on the; left heel actually looks improved. Original area on the left foot and  proximity to the first and second toes looks about the same. He has superficial areas on the dorsal foot, anterior calf and then the reopening of his original wound on the left lateral calf which looks about the same The only area he has on the right is the dorsal webspace first and second which is smaller. He has [name] large area of dry erythematous skin on the left buttock small open area here. 11/13/2018; the patient arrives in much better condition. The erythema around the wound on the left lateral calf is [name] lot better. Not sure whether this was the clindamycin or the TCA and ketoconazole or just in the improvement in edema control [stasis dermatitis]. In any case this is [name] lot better. The area on the left heel is very small and just about resolved using silver collagen we have been using silver alginate to the areas on his dorsal feet 11/20/2018; his wounds include the left lateral calf, left heel, dorsal aspects of both feet just proximal to the first second webspace. He is stable to slightly improved. I did not think any changes to his dressings were going to be necessary 11/27/2018 he has [name] reopening on the left buttock which is surrounded by what looks like tinea or perhaps some other form of dermatitis. The area on the left dorsal foot has some erythema around it I have marked this area but I am not sure whether this is cellulitis or not. Left heel is not closed. Left calf the reopening is really slightly longer and probably worse 1/13; in general things look better and smaller except for the left dorsal foot. Area on the left heel is just about closed, left buttock looks better only [name] small wound remains in the skin looks better [using Lotrisone] 1/20; the area on the left heel only has [name] few remaining open areas here. Left lateral calf about the same in terms of size, left dorsal foot slightly larger right lateral foot still not closed. The area on the left buttock has no open wound and the  surrounding skin looks [name] lot better 1/27; the area on the left heel is closed. Left lateral calf better but still requiring extensive debridements. The area on his left buttock is closed. He still has the open areas on the left dorsal foot which is slightly smaller in the right foot which is slightly expanded. We have been using Iodoflex on these areas as well 2/3; left heel is closed. Left lateral calf still requiring debridement using Iodoflex there is no open area on his left buttock however he has dry scaly skin over [name] large area of this. Not really responding well to the Lotrisone. Finally the areas on his dorsal feet at the level of the first second webspace are slightly smaller on the right and about the same on the left. Both of these vigorously debrided with Anasept and gauze 2/10; left heel remains closed he has dry erythematous skin over the left buttock but there is no open wound here. Left lateral leg has come in and with. Still requiring debridement we have been using Iodoflex here. Finally the area on the left dorsal foot and right dorsal foot are really about the same extremely dry callused fissured areas. He  does not yet have [name] dermatology appointment 2/17; left heel remains closed. He has [name] new open area on the left buttock. The area on the left lateral calf is bigger longer and still covered in necrotic debris. No major change in his foot areas bilaterally. I am awaiting for [name] dermatologist to look on this. We have been using ketoconazole I do not know that this is been doing any good at all. 2/24; left heel remains closed. The left buttock wound that was new reopening last week looks better. The left lateral calf appears better also although still requires debridement. The major area on his foot is the left first second also requiring debridement. We have been putting Prisma on all wounds. I do not believe that the ketoconazole has done too much good for his feet. He will use  Lotrisone I am going to give him [name] 2-week course of terbinafine. We still do not have [name] dermatology appointment 3/2 left heel remains closed however there is skin over bone in this area I pointed this out to him today. The left buttock wound is epithelialized but still does not look completely stable. The area on the left leg required debridement were using silver collagen here. With regards to his feet we changed to Lotrisone last week and silver alginate. 3/9; left heel remains closed. Left buttock remains closed. The area on the right foot is essentially closed. The left foot remains unchanged. Slightly smaller on the left lateral calf. Using silver collagen to both of these areas 3/16-Left heel remains closed. Area on right foot is closed. Left lateral calf above the lateral malleolus open wound requiring debridement with easy bleeding. Left dorsal wound proximal to first toe also debrided. Left ischial area open new. Patient has been using Prisma with wrapping every 3 days. Dermatology appointment is apparently tomorrow.Patient has completed his terbinafine 2-week course with some apparent improvement according to him, there is still flaking and dry skin in his foot on the left 3/23; area on the right foot is reopened. The area on the left anterior foot is about the same still [name] very necrotic adherent surface. He still has the area on the left leg and reopening is on the left buttock. He apparently saw dermatology although I do not have [name] note. According to the patient who is usually fairly well informed they did not have any good ideas. Put him on oral terbinafine which she is been on before. 3/30; using silver collagen to all wounds. Apparently his dermatologist put him on doxycycline and rifampin presumably some culture grew staph. I do not have this result. He remains on terbinafine although I have used terbinafine on him before 4/6; patient has had [name] fairly substantial reopening on the  right foot between the first and second toes. He is finished his terbinafine and I believe is on doxycycline and rifampin still as prescribed by dermatology. We have been using silver collagen to all his wounds although the patient reports that he thinks silver alginate does better on the wounds on his buttock. 4/13; the area on his left lateral calf about the same size but it did not require debridement. Left dorsal foot just proximal to the webspace between the first and second toes is about the same. Still nonviable surface. I note some superficial bronze discoloration of the dorsal part of his foot Right dorsal foot just proximal to the first and second toes also looks about the same. I still think there may be the same  discoloration I noted above on the left Left buttock wound looks about the same 4/20; left lateral calf appears to be gradually contracting using silver collagen. 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 The left dorsal foot wound is debrided of tightly adherent necrotic debris and really cleans up quite nicely. The right area is worse with expansion. I did not debride this it is now over the base of the second toe The area on his left buttock is smaller no debridement is required using silver collagen 5/4; left calf continues to make good progress. He arrives with erythema around the wounds on his dorsal foot which even extends to the plantar aspect. Very concerning for coexistent infection. He is finished the erythromycin I gave him for possible erythrasma this does not seem to have helped. The area on the left foot is about the same base of the dorsal toes Is area on the buttock looks improved on the left Bonn, Alaa E ([id number]) [id number] Page 11 of 37 5/11; left calf and left buttock continued to make good progress. Left foot is about the same to slightly improved. Major problem is on  the right foot. He has not had an x-ray. Deep tissue culture I did last week showed both Enterobacter and E. coli. I did not change the doxycycline I put him on empirically although neither 1 of these were plated to doxycycline. He arrives today with the erythema looking worse on both the dorsal and plantar foot. Macerated skin on the bottom of the foot. he has not been systemically unwell 5/18-Patient returns at 1 week, left calf wound appears to be making some progress, left buttock wound appears slightly worse than last time, left foot wound looks slightly better, right foot redness is marginally better. X-ray of both feet show no air or evidence of osteomyelitis. Patient is finished his Omnicef and terbinafine. He continues to have macerated skin on the bottom of the left foot as well as right 5/26; left calf wound is better, left buttock wound appears to have multiple small superficial open areas with surrounding macerated skin. X-rays that I did last time showed no evidence of osteomyelitis in either foot. He is finished cefdinir and doxycycline. I do not think that he was on terbinafine. He continues to have [name] large superficial open area on the right foot anterior dorsal and slightly between the first and second toes. I did send him to dermatology 2 months ago or so wondering about whether they would do [name] fungal scraping. I do not believe they did but did do [name] culture. We have been using silver alginate to the toe areas, he has been using antifungals at home topically either ketoconazole or Lotrisone. We are using silver collagen on the left foot, silver alginate on the right, silver collagen on the left lateral leg and silver alginate on the left buttock 6/1; left buttock area is healed. We have the left dorsal foot, left lateral leg and right dorsal foot. We are using silver alginate to the areas on both feet and silver collagen to the area on his left lateral calf 6/8; the left buttock  apparently reopened late last week. He is not really sure how this happened. He is tolerating the terbinafine. Using silver alginate to all wounds 6/15; left buttock wound is larger than last week but still superficial. Came in the clinic today with [name] report of purulence from the left lateral leg I did not identify any infection  Both areas on his dorsal feet appear to be better. He is tolerating the terbinafine. Using silver alginate to all wounds 6/22; left buttock is about the same this week, left calf quite [name] bit better. His left foot is about the same however he comes in with erythema and warmth in the right forefoot once again. Culture that I gave him in the beginning of May showed Enterobacter and E. coli. I gave him doxycycline and things seem to improve although neither 1 of these organisms was specifically plated. 6/29; left buttock is larger and dry this week. Left lateral calf looks to me to be improved. Left dorsal foot also somewhat improved right foot completely unchanged. The erythema on the right foot is still present. He is completing the Ceftin dinner that I gave him empirically [see discussion above.) 7/6 - All wounds look to be stable and perhaps improved, the left buttock wound is slightly smaller, per patient bleeds easily, completed ceftin, the right foot redness is less, he is on terbinafine 7/13; left buttock wound about the same perhaps slightly narrower. Area on the left lateral leg continues to narrow. Left dorsal foot slightly smaller right foot about the same. We are using silver alginate on the right foot and Hydrofera Blue to the areas on the left. Unna boot on the left 2 layer compression on the right 7/20; left buttock wound absolutely the same. Area on lateral leg continues to get better. Left dorsal foot require debridement as did the right no major change in the 7/27; left buttock wound the same size necrotic debris over the surface. The area on the lateral leg  is closed once again. His left foot looks better right foot about the same although there is some involvement now of the posterior first second toe area. He is still on terbinafine which I have given him for [name] month, not certain [name] centimeter major change 06/25/19-All wounds appear to be slightly improved according to report, left buttock wound looks clean, both foot wounds have minimal to no debris the right dorsal foot has minimal slough. We are using Hydrofera Blue to the left and silver alginate to the right foot and ischial wound. 8/10-Wounds all appear to be around the same, the right forefoot distal part has some redness which was not there before, however the wound looks clean and small. Ischial wound looks about the same with no changes 8/17; his wound on the left lateral calf which was his original chronic venous insufficiency wound remains closed. Since I last saw him the areas on the left dorsal foot right dorsal foot generally appear better but require debridement. The area on his left initial tuberosity appears somewhat larger to me perhaps hyper granulated and bleeds very easily. We have been using Hydrofera Blue to the left dorsal foot and silver alginate to everything else 8/24; left lateral calf remains closed. The areas on his dorsal feet on the webspace of the first and second toes bilaterally both look better. The area on the left buttock which is the pressure ulcer stage II slightly smaller. I change the dressing to Hydrofera Blue to all areas 8/31; left lateral calf remains closed. The area on his dorsal feet bilaterally look better. Using Hydrofera Blue. Still requiring debridement on the left foot. No change in the left buttock pressure ulcers however 9/14; left lateral calf remains closed. Dorsal feet look quite [name] bit better than 2 [name] ago. Flaking dry skin also [name] lot better with the ammonium lactate I  gave him 2 [name] ago. The area on the left buttock is improved. He states  that his Roho cushion developed [name] leak and he is getting [name] new one, in the interim he is offloading this vigorously 9/21; left calf remains closed. Left heel which was [name] possible DTI looks better this week. He had macerated tissue around the left dorsal foot right foot looks satisfactory and improved left buttock wound. I changed his dressings to his feet to silver alginate bilaterally. Continuing Hydrofera Blue on the left buttock. 9/28 left calf remains closed. Left heel did not develop anything [possible DTI] dry flaking skin on the left dorsal foot. Right foot looks satisfactory. Improved left buttock wound. We are using silver alginate on his feet Hydrofera Blue on the buttock. I have asked him to go back to the Lotrisone on his feet including the wounds and surrounding areas 10/5; left calf remains closed. The areas on the left and right feet about the same. [name] lot of this is epithelialized however debris over the remaining open areas. He is using Lotrisone and silver alginate. The area on the left buttock using Hydrofera Blue 10/26. Patient has been out for 3 [name] secondary to Covid concerns. He tested negative but I think his wife tested positive. He comes in today with the left foot substantially worse, right foot about the same. Even more concerning he states that the area on his left buttock closed over but then reopened and is considerably deeper in one aspect than it was before [stage III wound] 11/2; left foot really about the same as last week. Quarter sized wound on the dorsal foot just proximal to the first second toes. Surrounding erythema with areas of denuded epithelium. This is not really much different looking. Did not look like cellulitis this time however. Right foot area about the same.. We have been using silver alginate alginate on his toes Left buttock still substantial irritated skin around the wound which I think looks somewhat better. We have been using Hydrofera Blue  here. 11/9; left foot larger than last week and [name] very necrotic surface. Right foot I think is about the same perhaps slightly smaller. Debris around the circumference also addressed. Unfortunately on the left buttock there is been [name] decline. Satellite lesions below the major wound distally and now [name] an additional one posteriorly we have been using Hydrofera Blue but I think this is [name] pressure issue 11/16; left foot ulcer dorsally again [name] very adherent necrotic surface. Right foot is about the same. Not much change in the pressure ulcer on his left buttock. 11/30; left foot ulcer dorsally basically the same as when I saw him 2 [name] ago. Very adherent fibrinous debris on the wound surface. Patient reports [name] lot of drainage as well. 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 We have been using Iodoflex, patient changed back to alginate because of the drainage. Area on his right dorsal foot really looks benign with [name] healthier surface certainly [name] lot better than on the left. Left buttock wounds all improved using Hydrofera Blue 12/7; left dorsal foot again no improvement. Tightly adherent debris. PCR culture I did last week only showed likely skin contaminant. I have gone ahead and done [name] punch biopsy of this which is about the last thing in terms of investigations I can think to do. He has known venous insufficiency and venous hypertension and this could be the issue here. The area on the right  foot is about the same left buttock slightly worse according to our intake nurse secondary to [facility] Blue sticking to the wound 12/14; biopsy of the left foot that I did last time showed changes that could be related to wound healing/chronic stasis dermatitis phenomenon no neoplasm. We have been using silver alginate to both feet. I change the one on the left today to Sorbact and silver alginate to his other 2 wounds 12/28; the patient arrives with the  following problems; Major issue is the dorsal left foot which continues to be [name] larger deeper wound area. Still with [name] completely nonviable surface Paradoxically the area mirror image on the right on the right dorsal foot appears to be getting better. He had some loss of dry denuded skin from the lower part of his original wound on the left lateral calf. Some of this area looked [name] little vulnerable and for this reason we put him in wrap that on this side this week The area on his left buttock is larger. He still has the erythematous circular area which I think is [name] combination of pressure, sweat. This does not look like cellulitis or fungal dermatitis 11/26/2019; -Dorsal left foot large open wound with depth. Still debris over the surface. Using Sorbact [place], [place] ([id number]) [id number] Page 12 of 37 The area on the dorsal right foot paradoxically has closed over He has [name] reopening on the left ankle laterally at the base of his original wound that extended up into the calf. This appears clean. The left buttock wound is smaller but with very adherent necrotic debris over the surface. We have been using silver alginate here as well The patient had arterial studies done in 2017. He had biphasic waveforms at the dorsalis pedis and posterior tibial bilaterally. ABI in the left was 1.17. Digit waveforms were dampened. He has slight spasticity in the great toes I do not think [name] TBI would be possible 1/11; the patient comes in today with [name] sizable reopening between the first and second toes on the right. This is not exactly in the same location where we have been treating wounds previously. According to our intake nurse this was actually fairly deep but 0.6 cm. The area on the left dorsal foot looks about the same the surface is somewhat cleaner using Sorbact, his MRI is in 2 days. We have not managed yet to get arterial studies. The new reopening on the left lateral calf  looks somewhat better using alginate. The left buttock wound is about the same using alginate 1/18; the patient had his ARTERIAL studies which were quite normal. ABI in the right at 1.13 with triphasic/biphasic waveforms on the left ABI 1.06 again with triphasic/biphasic waveforms. It would not have been possible to have done [name] toe brachial index because of spasticity. We have been using Sorbac to the left foot alginate to the rest of his wounds on the right foot left lateral calf and left buttock 1/25; arrives in clinic with erythema and swelling of the left forefoot worse over the first MTP area. This extends laterally dorsally and but also posteriorly. Still has an area on the left lateral part of the lower part of his calf wound it is eschared and clearly not closed. Area on the left buttock still with surrounding irritation and erythema. Right foot surface wound dorsally. The area between the right and first and second toes appears better. 2/1; The left foot wound is about the same. Erythema slightly better I gave him  [name] week of doxycycline empirically Right foot wound is more extensive extending between the toes to the plantar surface Left lateral calf really no open surface on the inferior part of his original wound however the entire area still looks vulnerable Absolutely no improvement in the left buttock wound required debridement. 2/8; the left foot is about the same. Erythema is slightly improved I gave him clindamycin last week. Right foot looks better he is using Lotrimin and silver alginate He has [name] breakdown in the left lateral calf. Denuded epithelium which I have removed Left buttock about the same were using Hydrofera Blue 2/15; left foot is about the same there is less surrounding erythema. Surface still has tightly adherent debris which I have debriding however not making any progress Right foot has [name] substantial wound on the medial right second toe between the first and second  webspace. Still an open area on the left lateral calf distal area. Buttock wound is about the same 2/22; left foot is about the same less surrounding erythema. Surface has adherent debris. Polymen Ag Right foot area significant wound between the first and second toes. We have been using silver alginate here Left lateral leg polymen Ag at the base of his original venous insufficiency wound Left buttock some improvement here 3/1; Right foot is deteriorating in the first second toe webspace. Larger and more substantial. We have been using silver alginate. Left dorsal foot about the same markedly adherent surface debris using PolyMem Ag Left lateral calf surface debris using PolyMem AG Left buttock is improved again using PolyMem Ag. He is completing his terbinafine. The erythema in the foot seems better. He has been on this for 2 [name] 3/8; no improvement in any wound area in fact he has [name] small open area on the dorsal midfoot which is new this week. He has not gotten his foot x-rays yet 3/15; his x-rays were both negative for osteomyelitis of both feet. No major change in any of his wounds on the extremities however his buttock wounds are better. We have been using polymen on the buttocks, left lower leg. Iodoflex on the left foot and silver alginate on the right 3/22; arrives in clinic today with the 2 major issues are the improvement in the left dorsal foot wound which for once actually looks healthy with [name] nice healthy wound surface without debridement. Using Iodoflex here. Unfortunately on the left lateral calf which is in the distal part of his original wound he came to the clinic here for there was purulent drainage noted some increased breakdown scattered around the original area and [name] small area proximally. We we are using polymen here will change to silver alginate today. His buttock wound on the left is better and I think the area on the right first second toe webspace is  also improved 3/29; left dorsal foot looks better. Using Iodoflex. Left ankle culture from deterioration last time grew E. coli, Enterobacter and Enterococcus. I will give him [name] course of cefdinir although that will not cover Enterococcus. The area on the right foot in the webspace of the first and second toe lateral first toe looks better. The area on his buttock is about healed Vascular appointment is on April 21. This is to look at his venous system vis--vis continued breakdown of the wounds on the left including the left lateral leg and left dorsal foot he. He has had previous ablations on this side 4/5; the area between the right first and second toes lateral  aspect of the first toe looks better. Dorsal aspect of the left first toe on the left foot also improved. Unfortunately the left lateral lower leg is larger and there is [name] second satellite wound superiorly. The usual superficial abrasions on the left buttock overall better but certainly not closed 4/12; the area between the right first and second toes is improved. Dorsal aspect of the left foot also slightly smaller with [name] vibrant healthy looking surface. No real change in the left lateral leg and the left buttock wound is healed He has an unaffordable co-pay for Apligraf. Appointment with vein and vascular with regards to the left leg venous part of the circulation is on 4/21 4/19; we continue to see improvement in all wound areas. Although this is minor. He has his vascular appointment on 4/21. The area on the left buttock has not reopened although right in the center of this area the skin looks somewhat threatened 4/26; the left buttock is unfortunately reopened. In general his left dorsal foot has [name] healthy surface and looks somewhat smaller although it was not measured as such. The area between his first and second toe webspace on the right as [name] small wound against the first toe. The patient saw vascular surgery. The real question I  was asking was about the small saphenous vein on the left. He has previously ablated left greater saphenous vein. Nothing further was commented on on the left. Right greater saphenous vein without reflux at the saphenofemoral junction or proximal thigh there was no indication for ablation of the right greater saphenous vein duplex was negative for DVT bilaterally. They did not think there was anything from [name] vascular surgery point of view that could be offered. They ABIs within normal limits 5/3; only small open area on the left buttock. The area on the left lateral leg which was his original venous reflux is now 2 wounds both which look clean. We are using Iodoflex on the left dorsal foot which looks healthy and smaller. He is down to [name] very tiny area between the right first and second toes, using silver alginate 5/10; all of his wounds appear better. We have much better edema control in 4 layer compression on the left. This may be the factor that is allowing the left foot and left lateral calf to heal. He has external compression garments at home 04/14/20-All of his wounds are progressing well, the left forefoot is practically closed, left ischium appears to be about the same, right toe webspace is also smaller. The left lateral leg is about the same, continue using Hydrofera Blue to this, silver alginate to the ischium, Iodoflex to the toe space on the right 6/7; most of his wounds outside of the left buttock are doing well. The area on the left lateral calf and left dorsal foot are smaller. The area on the right foot in between the first and second toe webspace is barely visible although he still says there is some drainage here is the only reason I did not heal this out. Unfortunately the area on the left buttock almost looks like he has [name] skin tear from tape. He has open wound and then [name] large flap of skin that we are trying to get adherence over an area just next to the remaining wound 6/21; 2  week follow-up. I believe is been here for nurse visits. Miraculously the area between his first and second toes on the left dorsal foot is closed over. Still open on the  right first second web space. The left lateral calf has 2 open areas. Distally this is more superficial. The proximal area had [name] little more depth and required debridement of adherent necrotic material. His buttock wound is actually larger we have been using silver alginate here 6/28; the patient's area on the left foot remains closed. Still open wet area between the first and second toes on the right and also extending into the plantar aspect. We have been using silver alginate in this location. He has 2 areas on the left lower leg part of his original long wounds which I think are better. We have been using Hydrofera Blue here. Hydrofera Blue to the left buttock which is stable 7/12; left foot remains closed. Left ankle is closed. May be [name] small area between his right first and second toes the only truly open area is on the left buttock. We have been using Hydrofera Blue here Hsia, Bunyan E ([id number]) [id number] Page 13 of 37 7/19; patient arrives with marked deterioration especially in the left foot and ankle. We did not put him in [name] compression wrap on the left last week in fact he wore his juxta lite stockings on either side although he does not have an underlying stocking. He has [name] reopening on the left dorsal foot, left lateral ankle and [name] new area on the right dorsal ankle. More worrisome is the degree of erythema on the left foot extending on the lateral foot into the lateral lower leg on the left 7/26; the patient had erythema and drainage from the lateral left ankle last week. Culture of this grew MRSA resistant to doxycycline and clindamycin which are the 2 antibiotics we usually use with this patient who has multiple antibiotic allergies including linezolid, trimethoprim sulfamethoxazole. I had  give him an empiric doxycycline and he comes in the area certainly looks somewhat better although it is blotchy in his lower leg. He has not been systemically unwell. He has had areas on the left dorsal foot which is [name] reopening, chronic wounds on the left lateral ankle. Both of these I think are secondary to chronic venous insufficiency. The area between his first and second toes is closed as far as I can tell. He had [name] new wrap injury on the right dorsal ankle last week. Finally he has an area on the left buttock. We have been using silver alginate to everything except the left buttock we are using Hydrofera Blue 06/30/20-Patient returns at 1 week, has been given [name] sample dose pack of NUZYRA which is [name] tetracycline derivative [omadacycline], patient has completed those, we have been using silver alginate to almost all the wounds except the left ischium where we are using Hydrofera Blue all of them look better 8/16; since I last saw the patient he has been doing well. The area on the left buttock, left lateral ankle and left foot are all closed today. He has completed the [place] I gave him last time and tolerated this well. He still has open areas on the right dorsal ankle and in the right first second toe area which we are using silver alginate. 8/23; we put him in his bilateral external compression stockings last week as he did not have anything open on either leg except for concerning area between the right first and second toe. He comes in today with an area on the left dorsal foot slightly more proximal than the original wound, the left lateral foot but this is actually [name] continuation of  the area he had on the left lateral ankle from last time. As well he is opened up on the left buttock again. 8/30; comes in today with things looking [name] lot better. The area on the left lower ankle has closed down as has the left foot but with eschar in both areas. The area on the dorsal right ankle is also  epithelialized. Very little remaining of the left buttock wound. We have been using silver alginate on all wound areas 9/13; the area in the first second toe webspace on the right has fully epithelialized. He still has some vulnerable epithelium on the right and the ankle and the dorsal foot. He notes weeping. He is using his juxta lite stocking. On the left again the left dorsal foot is closed left lateral ankle is closed. We went to the juxta lite stocking here as well. Still vulnerable in the left buttock although only 2 small open areas remain here 9/27; 2-week follow-up. We did not look at his left leg but the patient says everything is closed. He is [name] bit disturbed by the amount of edema in his left foot he is using juxta lite stockings but asking about over the toes stockings which would be 30/40, will talk to him next time. According to him there is no open wound on either the left foot or the left ankle/calf He has an open area on the dorsal right calf which I initially point [name] wrap injury. He has superficial remaining wound on the left ischial tuberosity been using silver alginate although he says this sticks to the wound 10/5; we gave him 2-week follow-up but he called yesterday expressing some concerns about his right foot right ankle and the left buttock. He came in early. There is still no open areas on the left leg and that still in his juxta lite stocking 10/11; he only has 1 small area on the left buttock that remains measuring millimeters 1 mm. Still has the same irritated skin in this area. We recommended zinc oxide when this eventually closes and pressure relief is meticulously is he can do this. He still has an area on the dorsal part of his right first through third toes which is [name] bit irritated and still open and on the dorsal ankle near the crease of the ankle. We have been using silver alginate and using his own stocking. He has nothing open on the left leg or foot 10/25;  2-week follow-up. Not nearly as good on the left buttock as I was hoping. For open areas with 5 looking threatened small. He has the erythematous irritated chronic skin in this area. 1 area on the right dorsal ankle. He reports this area bleeds easily Right dorsal foot just proximal to the base of his toes We have been using silver alginate. 11/8; 2-week follow-up. Left buttock is about the same although I do not think the wounds are in the same location we have been using silver alginate. I have asked him to use zinc oxide on the skin around the wounds. He still has [name] small area on the right dorsal ankle he reports this bleeds easily Right dorsal foot just proximal to the base of the toes does not have anything open although the skin is very dry and scaly He has [name] new opening on the nailbed of the left great toe. Nothing on the left ankle 11/29; 3-week follow-up. Left buttock has 2 open areas. And washing of these wounds today started bleeding easily.  Suggesting very friable tissue. We have been using silver alginate. Right dorsal ankle which I thought was initially [name] wrap injury we have been using silver alginate. Nothing open between the toes that I can see. He states the area on the left dorsal toe nailbed healed after the last visit in 2 or 3 days 12/13; 3-week follow-up. His left buttock now has 3 open areas but the original 2 areas are smaller using polymen here. Surrounding skin looks better. The right dorsal ankle is closed. He has [name] small opening on the right dorsal foot at the level of the third toe. In general the skin looks better here. He is wearing his juxta lite stocking on the left leg says there is nothing open 11/24/2020; 3 [name] follow-up. His left buttock still has the 3 open areas. We have been using polymen but due to lack of response he changed to [facility] area. Surrounding skin is dry erythematous and irritated looking. There is no evidence of infection either bacterial  or fungal however there is loss of surface epithelium He still has very dry skin in his foot causing irritation and erythema on the dorsal part of his toes. This is not responded to prolonged courses of antifungal simply looks dry and irritated 1/24; left buttock area still looks about the same he was unable to find the triad ointment that we had suggested. The area on the right lower leg just above the dorsal ankle has reopened and the areas on the right foot between the first second and second third toes and scaling on the bottom of the foot has been about the same for quite some time now. been using silver alginate to all wound areas 2/7; left buttock wound looked quite good although not much smaller in terms of surface area surrounding skin looks better. Only [name] few dry flaking areas on the right foot in between the first and second toes the skin generally looks better here [ammonium lactate]. Finally the area on the right dorsal ankle is closed 2/21; There is no open area on the right foot even between the right first and second toe. Skin around this area dorsally and plantar aspects look better. He has [name] reopening of the area on the right ankle just above the crease of the ankle dorsally. I continue to think that this is probably friction from spasms may be even this time with his stocking under the compression stockings. Wounds on his left buttock look about the same there [name] couple of areas that have reopened. He has [name] total square area of loss of epithelialization. This does not look like infection it looks like [name] contact dermatitis but I just cannot determine to what 3/14; there is nothing on the right foot between the first and second toes this was carefully inspected under illumination. Some chronic irritation on the dorsal part of his foot from toes 1-3 at the base. Nothing really open here substantially. Still has an area on the right foot/ankle that is actually larger and  hyper granulated. His buttock area on the left is just about closed however he has chronic inflammation with loss of the surface epithelial layer 3/28; 2-week follow-up. In clinic today with [name] new wound on the left anterior mid tibia. Says this happened about 2 [name] ago. He is not really sure how wonders about the spasticity of his legs at night whether that could have caused this other than that he does not have [name] good idea. He has been  using topical antibiotics and silver alginate. The area on his right dorsal ankle seems somewhat better. Finally everything on his left buttock is closed. 4/11; 2-week follow-up. All of his wounds are better except for the area over the ischium and left buttock which have opened up widely again. At least part of this is covered in necrotic fibrinous material another part had rolled nonviable skin. The area on the right ankle, left anterior mid tibia are both [name] lot better. He had no open wounds on either foot including the areas between the first and second toes 4/25; patient presents for 2-week follow-up. He states that the wounds are overall stable. He has no complaints today and states he is using Hydrofera Blue to open wounds. 5/9; have not seen this man in over [name] month. For my memory he has open areas on the left mid tibia and right ankle. T oday he has new open area on the right dorsal foot which we have not had [name] problem with recently. He has the sustained area on the left buttock He is also changed his insurance at the beginning of the year [employer]. We will need prior authorizations for debridement 5/23; patient presents for 2-week follow-up. He has prior authorizations for debridement. He denies any issues in the past 2 [name] with his wound care. He has been using Hydrofera Blue to all the wounds. He does report [name] circular rash to the upper left leg that is new. He denies acute signs of infection. 6/6; 2-week follow-up. The patient has open wounds on  the left buttock which are worse than the last time I saw this about [name] month ago. He also has [name] new area to me on the left anterior mid tibia with some surrounding erythema. The area on the dorsal ankle on the right is closed but I think this will be [name] friction injury Bertran, Giulian E ([id number]) [id number] Page 14 of 37 every time this area is exposed to either our wraps or his compression stockings caused by unrelenting spasms in this leg. 6/20; 2-week follow-up. The patient has open wounds on the left buttock which is about the same. Using [facility] here. - The left mid tibia has [name] static amount of surrounding erythema. Also [name] raised area in the center. We have been using Hydrofera Blue here. Finally he has broken down in his dorsal right foot extending between the first and second toes and going to the base of the first and second toe webspace. I have previously assumed that this was severe venous hypertension 7/5; 2-week follow-up The left buttock wound actually looks better. We are using Hydrofera Blue. He has extensive skin irritation around this area and I have not really been able to get that any better. I have tried Lotrisone i.e. antifungals and steroids. More most recently we have just been using Coloplast really looks about the same. The left mid tibia which was new last week culture to have very resistant staph aureus. Not only methicillin-resistant but doxycycline resistant. The patient has [name] plethora of antibiotic allergies including sulfa, linezolid. I used topical bacitracin on this but he has not started this yet. In addition he has an expanding area of erythema with [name] wound on the dorsal right foot. I did [name] deep tissue culture of this area today 7/12; Left buttock area actually looks better surrounding skin also looks less irritated. Left mid tibia looks about the same. He is using bacitracin this is not worse Right dorsal foot  looks about the same  as well. The left first toe also looks about the same 7/19; left buttock wound continues to improve in terms of open areas Left mid tibia is still concerning amount of swelling he is using bacitracin Dorsal left first toe somewhat smaller Right dorsal foot somewhat smaller 7/25; left buttock wound actually continues to improve Left mid tibia area has less swelling. I gave him all my samples of new Nuzyra. This seems to have helped although the wound is still open it. His abrasion closed by here Left dorsal great toe really no better. Still [name] very nonviable surface Right dorsal foot perhaps some better. We have been using bacitracin and silver nitrate to the areas on his lower legs and Hydrofera Blue to the area on the buttock. 8/16 Disappointed that his left buttock wound is actually more substantial. Apparently during the last nurse visit these were both very small. He has continued irritation to [name] large area of skin on his buttock. I have never been able to totally explain this although I think it some combination of the way he sits, pressure, moisture. He is not incontinent enough to contribute to this. Left dorsal great toe still fibrinous debris on the surface that I have debrided today Large area across the dorsal right toes. The area on the left anterior mid tibia has less swelling. He completed the [place]. This does not look infected although the tissue is still fried 8/30; 2-week follow-up. Left buttock areas not improved. We used Hydrofera Blue on this. Weeping wet with the surrounding erythema that I have not been able to control even with Lotrisone and topical Coloplast Left dorsal great toe looks about the same More substantial area again at the base of his toes on the left which is new this week. Area across the dorsal right toes looks improved The left anterior mid tibia looks like it is trying to close 9/13; 2-week follow-up. Using silver alginate on all of his wounds. The  left dorsal foot does not look any better. He has the area on the dorsal toe and also the areas at the base of all of his toes 1 through 3. On the right foot he has [name] similar pattern in [name] similar area. He has the area on his left mid tibia that looks fairly healthy. Finally the area on his left buttock looks somewhat bette 9/20; culture I did of the left foot which was [name] deep tissue culture last time showed E. coli he has erythema around this wound. Still [name] completely necrotic surface. His right dorsal foot looks about the same. He has [name] very friable surface to the left anterior mid tibia. Both buttock wounds look better. We have been using silver alginate to all wounds 10/4; he has completed the cephalexin that I gave him last time for the left foot. He is using topical gentamicin under silver alginate silver alginate being applied to all the wounds. Unfortunately all the wounds look irritated on his dorsal right foot dorsal left foot left mid tibia. I wonder if this could be [name] silver allergy. I am going to change him to [facility] on the lower extremity. The skin on the left buttock and left posterior thigh still flaking dry and irritated. This has continued no matter what I have applied topically to this. He has [name] solitary open wound which by itself does not look too bad however the entire area of surrounding skin does not change no matter what we have  applied here 10/18; the area on the left dorsal foot and right dorsal foot both look better. The area on the right extends into the plantar but not between his toes. We have been using silver alginate. He still has [name] rectangular erythematous area around the area on the left tibia. The wound itself is very small. Finally everything on his left buttock looks [name] little larger the skin is erythematous 11/15; patient comes in with the left dorsal and right dorsal foot distally looking somewhat better. Still nonviable surface on the left foot which  required debridement. He still has the area on the left anterior mid tibia although this looks somewhat better. He has [name] new area on the right lateral lower leg just above the ankle. Finally his left buttock looks terrible with multiple superficial open wounds geometric square shaped area of chronic erythema which I have not been able to sort through 11/29; right dorsal foot and left dorsal foot both look somewhat better. No debridement required. He has the fragile area on the left anterior mid tibia this looks and continues to look somewhat better. Right lateral lower leg just above the ankle we identified last time also looks better. In general the area on his left buttock looks improved. We are using Hydrofera Blue to all wound areas 12/13; right dorsal foot looks better. The area on the right lateral leg is healed. Left dorsal foot has 2 open areas both of which require debridement. The fragile area on the left anterior mid tibial looks better. Smaller area on his buttocks. Were using Hydrofera Blue 1/10; patient comes in with everything looking slightly larger and/or worse. This includes his left buttock, reopening of the left mid tibia, larger areas on the left dorsal foot and what looks to be [name] cellulitis on the right dorsal and plantar foot. We have been using Hydrofera Blue on all wounds. 1/17; right dorsal foot distally looks better today. The left foot has 2 open wounds that are about the same surrounding erythema. 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 No malignancy they did not stain for fungus 1/24; his right distal foot is not closed dry and scaly but the wound looks like it is contracting. I did not debride anything here. Problem on his left dorsal foot with expanding erythema. Apparently there were problems last week getting the Elesa Hacker however it  is now available at the [facility] but [name] week later. He is using ketoconazole and Coloplast to the left buttock along with Hydrofera Blue this actually looks quite [name] bit better today. 1/31; right dorsal foot again is dry and scaly but looking to contract. He has been using [name] moisturizer on his feet at my request but he is not sure which 1. The left dorsal foot wounds look about the same there is erythema here that I marked last week however after course of Nuzyra it certainly is not any better but not any worse either. Finally on his left buttock the skin continues to look better he has the original wound but [name] new substantial area towards the gluteal cleft. Almost like [name] skin tear. I used scissors to remove skin and subcutaneous tissue here silver nitrate and direct pressure 2/7; right dorsal foot. This does not look too much different from last week. Some erythema skin dry and scaly. No debridement. Left dorsal great toe again still not much improvement.  I did remove flaking dry skin and callus from around the edge. Finally on his left buttock. The skin is somewhat better in the periwound. Surface wounds are superficial somewhat better than last week. Nikolic, LEIF LOFLIN ([id number]) [id number] Page 15 of 37 01/26/2022: Is [name] little bit of [name] mystery as to why his wounds fail to respond to treatments and actually seem to get worse. This is my first encounter with this patient. He was previously followed by Dr. Dellia Nims. Based upon my review of the chart, it seems that there is [name] little bit of [name] mystery as to why his wounds do not respond as anticipated to the interventions applied and sometimes even get worse. Biopsies have been performed and he was seen by dermatology in [place], but that did not shed any light on the matter. T oday, his gluteal wound is larger, with substantial drainage, rather malodorous. The food wounds are not terrible, but he has [name] lot of callus and  scaly skin around these. He is currently getting silver alginate on the gluteal wound, with idodoflex to the feet. He is using lotrisone on his legs for the dry, scaly skin. 02/09/2022: There has really been no change to any of his wounds. The gluteal wound less drainage and odor, but remains about the same size, the periwound skin remains oddly scaly. His lower extremity wounds also appear roughly the same size. They continue to accumulate [name] small amount of slough. The periwound on his feet and ankle wounds has dry eschar and loose dead skin. We have been using silver alginate on the gluteal wound and Iodoflex on his feet and ankle wounds. T the periwound around his gluteal lesion and Lotrisone on his feet and legs. o 02/23/2022: The right plantar foot wound is closed. The gluteal site looks small but has continued to produce hypertrophic granulation tissue. The foot wounds all look about the same on the dorsal surface of the right foot; on the left, there is only [name] small open area at the site of where his left great toenail would have been. 03/16/2022: The right ankle wound is healed. The right dorsal foot wound is about the same. The left dorsal foot wound is quite [name] bit smaller and the ischial wound is nearly closed. 03/30/2022: The right ankle wound reopened. Both dorsal foot wounds are quite [name] bit smaller. Unfortunately, he appears to have sheared part of his ischial wound open further, perhaps during [name] transfer. 04/13/2022: The right ankle wound has hypertrophic granulation tissue present. The dorsal foot wounds continue to decrease in size. The ischial wound looks about the same today, no better, no worse. 04/27/2022: The right ankle wound has closed. Unfortunately, it looks like some moisture got underneath the dry skin on both of his dorsal feet and these wounds have expanded in size. The ischial wound remains the same with perhaps [name] little bit more slough accumulation than at our previous  visit. 05/11/2022: The right ankle wound remains closed. There is [name] left anterior tibial wound that is small has patchy openings with accumulated slough. The dorsum of his right foot appears to be nearly healed with just [name] small punctate opening. The plantar surface of his right foot has [name] new opening that looks like he may have picked some skin there. His sacral ulcer has hypertrophic granulation tissue but has some slough accumulation. The dorsum of his left foot has multiple open areas in [name] fairly ragged distribution. All of these have slough accumulated within them. 06/01/2022:  The right ankle and left anterior tibial wound are both closed. Dorsum of his right foot and left foot both look substantially better with just tiny scattered openings The without any slough accumulation. He has sheared open new areas on his left gluteus and ischium. He says that his wheelchair cushion, which is air-filled, has [name] leak and so it keeps deflating. He is awaiting [name] new cushion. 06/15/2022: The right ankle wound has reopened and the fat layer is exposed. Both dorsal feet have just small openings with just [name] little bit of slough and eschar accumulation. The wound on his left gluteus and ischium is larger again today and has [name] foul odor. 06/29/2022: The right ankle wound has hypertrophic granulation tissue buildup. His dorsal foot wounds both look better with just some eschar on the surface. He has [name] new wound on his left lateral ankle. He is not sure how he acquired it but by appearance, it looks that he hit it on something, potentially his wheelchair or bed. The ischial wound is about the same but is cleaner without any significant purulent drainage or odor. He did not understand what the [facility] call was about and therefore he does not have the topical compounded antibiotic. 07/13/2022: The right ankle wound again has hypertrophic granulation tissue, but less so than at his previous visit. The ischial wound has  improved tremendously the use of the [facility] topical antibiotic. No significant change to the left lateral ankle wound; it is fibrotic with slough present. The skin of both of his feet, especially on the right, has [name] yeasty appearance. 08/10/2022: There is again hypertrophic granulation tissue on the right anterior ankle wound. Both feet are about the same. The left lateral ankle wound is [name] little bit desiccated and has some slough buildup. He unfortunately suffered [name] new injury when he was removing his pants and they caught his bandage which caused [name] large skin tear on his left ischium, just distal to the existing wound. The existing left ischial wound, however, is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 terms of length and width. Aggressive debridement with [name] curette afterwards the tissue appears healthier. Left foot wound appears improved in the area on the dorsal right second toe is improved 08/30/16 patient developed [name] fever over the weekend and was seen in an urgent care. Felt to have [name] UTI and put on doxycycline. He has been since changed over the phone to [facility]. After we took off the wrap on his right leg today the leg is swollen warm and erythematous, probably more likely the source of the fever 09/06/16; have been using collagen to the major left leg wound, silver alginate to the area on his anterior foot/toes 09/13/16; the  areas on his anterior foot/toes on both sides appear to be virtually closed. Extensive wound on the left lateral leg perhaps slightly narrower but each visit still covered an adherent surface slough 09/16/16 patient was in for his usual Thursday nurse visit however the intake nurse noted significant erythema of his dorsal right foot.  He is also running [name] low- grade fever and having increasing spasms in the right leg 09/20/16 here for cellulitis involving his right great toes and forefoot. This is [name] lot better. Still requiring debridement on his left lateral leg. Santyl direct says he needs prior authorization. Therefore his wife cannot change this at home 09/30/16; the patient's extensive area on the left lateral calf and ankle perhaps somewhat better. Using Santyl. The area on the left toes is healed and I think the area on his right dorsal foot is healed as well. There is no cellulitis or venous inflammation involving the right leg. He is going to need compression stockings here. 10/07/16; the patient's extensive wound on the left lateral calf and ankle does not measure any differently however there appears to be less adherent surface slough using Santyl and aggressive weekly debridements 10/21/16; no major change in the area on the left lateral calf. Still the same measurement still very difficult to debridement adherent slough and nonviable subcutaneous tissue. This is not really been helped by several [name] of Santyl. Previously for 2 [name] I used Iodoflex for [name] short period. [name] prolonged course of Hydrofera Blue didn't really help. I'm not sure why I only used 2 [name] of Iodoflex on this there is no evidence of surrounding infection. He has [name] small area on the right second toe which looks as though it's progressing towards closure 10/28/16; the wounds on his toes appear to be closed. No major change in the left lateral leg wound although the surface looks somewhat better using Iodoflex. He has had  previous arterial studies that were normal. He has had reflux studies and is status post ablation although I don't have any exact notes on which vein was ablated. I'll need to check the surgical record 11/04/16; he's had [name] reopening between the first and second toe on the left and right. No major change in the left lateral leg wound. There is what appears to be cellulitis of the left dorsal foot 11/18/16 the patient was hospitalized initially in [place] and then subsequently transferred to [facility] long and was admitted there from 11/09/16 through 11/12/16. He had developed progressive cellulitis on the right leg in spite of the doxycycline I gave him. I'd spoken to the hospitalist in [place] who was concerned about continuing leukocytosis. CT scan is what I suggested this was done which showed soft tissue swelling without evidence of osteomyelitis or an underlying abscess blood cultures were negative. At [facility] he was treated with vancomycin and Primaxin and then add an infectious disease consult. He was transitioned to Ceftaroline. He has been making progressive improvement. Overall [name] severe cellulitis of the right leg. He is been using silver alginate to her original wound on the left leg. The wounds in his toes on the right are closed there is [name] small open area on the base of the left second toe 11/26/15; the patient's right leg is much better although there is still some edema here this could be reminiscent from his severe cellulitis likely on top of some degree of lymphedema. His left anterior leg wound has less surface slough as reported by her intake nurse. Small wound at the base of the left second toe 12/02/16; patient's right leg is better and there is no open wound here. His left anterior lateral leg wound continues to have [name] healthy-looking surface. Small wound at the base of the left second toe however there is erythema in the left forefoot which is worrisome 12/16/16;  is no open wounds on  his right leg. We took measurements for stockings. His left anterior lateral leg wound continues to have [name] healthy-looking surface. I'm not sure where we were with the Apligraf run through his insurance. We have been using Iodoflex. He has [name] thick eschar on the left first second toe interface, I suspect this may be fungal however there is no visible open 12/23/16; no open wound on his right leg. He has 2 small areas left of the linear wound that was remaining last week. We have been using Prisma, I thought I have disclosed this week, we can only look forward to next week Spradley, Oseph E ([id number]) [id number] Page 22 of 37 01/03/17; the patient had concerning areas of erythema last week, already on doxycycline for UTI through his primary doctor. The erythema is absolutely no better there is warmth and swelling both medially from the left lateral leg wound and also the dorsal left foot. 01/06/17- Patient is here for follow-up evaluation of his left lateral leg ulcer and bilateral feet ulcers. He is on oral antibiotic therapy, tolerating that. Nursing staff and the patient states that the erythema is improved from Monday. 01/13/17; the predominant left lateral leg wound continues to be problematic. I had put Apligraf on him earlier this month once. However he subsequently developed what appeared to be an intense cellulitis around the left lateral leg wound. I gave him Dalvance I think on 2/12 perhaps 2/13 he continues on cefdinir. The erythema is still present but the warmth and swelling is improved. I am hopeful that the cellulitis part of this control. I wouldn't be surprised if there is an element of venous inflammation as well. 01/17/17. The erythema is present but better in the left leg. His left lateral leg wound still does not have [name] viable surface buttons certain parts of this long thin wound it appears like there has been improvement in dimensions. 01/20/17; the erythema  still present but much better in the left leg. I'm thinking this is his usual degree of chronic venous inflammation. The wound on the left leg looks somewhat better. Is less surface slough 01/27/17; erythema is back to the chronic venous inflammation. The wound on the left leg is somewhat better. I am back to the point where I like to try an Apligraf once again 02/10/17; slight improvement in wound dimensions. Apligraf #2. He is completing his doxycycline 02/14/17; patient arrives today having completed doxycycline last Thursday. This was supposed to be [name] nurse visit however once again he hasn't tense erythema from the medial part of his wound extending over the lower leg. Also erythema in his foot this is roughly in the same distribution as last time. He has baseline chronic venous inflammation however this is [name] lot worse than the baseline I have learned to accept the on him is baseline inflammation 02/24/17- patient is here for follow-up evaluation. He is tolerating compression therapy. His voicing no complaints or concerns he is here anticipating an Apligraf 03/03/17; he arrives today with an adherent necrotic surface. I don't think this is surface is going to be amenable for Apligraf's. The erythema around his wound and on the left dorsal foot has resolved he is off antibiotics 03/10/17; better-looking surface today. I don't think he can tolerate Apligraf's. He tells me he had [name] wound VAC after [name] skin graft years ago to this area and they had difficulty with [name] seal. The erythema continues to be stable around this some degree of chronic  venous inflammation but he also has recurrent cellulitis. We have been using Iodoflex 03/17/17; continued improvement in the surface and may be small changes in dimensions. Using Iodoflex which seems the only thing that will control his surface 03/24/17- He is here for follow up evaluation of his LLE lateral ulceration and ulcer to right dorsal foot/toe space. He is voicing  no complaints or concerns, He is tolerating compression wrap. 03/31/17 arrives today with [name] much healthier looking wound on the left lower extremity. We have been using Iodoflex for [name] prolonged period of time which has for the first time prepared and adequate looking wound bed although we have not had much in the way of wound dimension improvement. He also has [name] small wound between the first and second toe on the right 04/07/17; arrives today with [name] healthy-looking wound bed and at least the top 50% of this wound appears to be now her. No debridement was required I have changed him to [facility] last week after prolonged Iodoflex. He did not do well with Apligraf's. We've had [name] re-opening between the first and second toe on the right 04/14/17; arrives today with [name] healthier looking wound bed contractions and the top 50% of this wound and some on the lesser 50%. Wound bed appears healthy. The area between the first and second toe on the right still remains problematic 04/21/17; continued very gradual improvement. Using [facility] 04/28/17; continued very gradual improvement in the left lateral leg venous insufficiency wound. His periwound erythema is very mild. We have been using Hydrofera Blue. Wound is making progress especially in the superior 50% 05/05/17; he continues to have very gradual improvement in the left lateral venous insufficiency wound. Both in terms with an length rings are improving. I debrided this every 2 [name] with #5 curet and we have been using Hydrofera Blue and again making good progress With regards to the wounds between his right first and second toe which I thought might of been tinea pedis he is not making as much progress very dry scaly skin over the area. Also the area at the base of the left first and second toe in [name] similar condition 05/12/17; continued gradual improvement in the refractory left lateral venous insufficiency wound on the left. Dimension smaller.  Surface still requiring debridement using Hydrofera Blue 05/19/17; continued gradual improvement in the refractory left lateral venous ulceration. Careful inspection of the wound bed underlying rumination suggested some degree of epithelialization over the surface no debridement indicated. Continue Hydrofera Blue difficult areas between his toes first and third on the left than first and second on the right. I'm going to change to silver alginate from silver collagen. Continue ketoconazole as I suspect underlying tinea pedis 05/26/17; left lateral leg venous insufficiency wound. We've been using Hydrofera Blue. I believe that there is expanding epithelialization over the surface of the wound albeit not coming from the wound circumference. This is [name] bit of an odd situation in which the epithelialization seems to be coming from the surface of the wound rather than in the exact circumference. There is still small open areas mostly along the lateral margin of the wound. ooHe has unchanged areas between the left first and second and the right first second toes which I been treating for tenia pedis 06/02/17; left lateral leg venous insufficiency wound. We have been using Hydrofera Blue. Somewhat smaller from the wound circumference. The surface of the wound remains [name] bit on it almost epithelialized sedation in appearance. I use  an open curette today debridement in the surface of all of this especially the edges ooSmall open wounds remaining on the dorsal right first and second toe interspace and the plantar left first second toe and her face on the left 06/09/17; wound on the left lateral leg continues to be smaller but very gradual and very dry surface using Hydrofera Blue 06/16/17 requires weekly debridements now on the left lateral leg although this continues to contract. I changed to silver collagen last week because of dryness of the wound bed. Using Iodoflex to the areas on his first and second toes/web  space bilaterally 06/24/17; patient with history of paraplegia also chronic venous insufficiency with lymphedema. Has [name] very difficult wound on the left lateral leg. This has been gradually reducing in terms of with but comes in with [name] very dry adherent surface. High switch to silver collagen [name] week or so ago with hydrogel to keep the area moist. This is been refractory to multiple dressing attempts. He also has areas in his first and second toes bilaterally in the anterior and posterior web space. I had been using Iodoflex here after [name] prolonged course of silver alginate with ketoconazole was ineffective [question tinea pedis] 07/14/17; patient arrives today with [name] very difficult adherent material over his left lateral lower leg wound. He also has surrounding erythema and poorly controlled edema. He was switched his Santyl last visit which the nurses are applying once during his doctor visit and once on [name] nurse visit. He was also reduced to 2 layer compression I'm not exactly sure of the issue here. 07/21/17; better surface today after 1 week of Iodoflex. Significant cellulitis that we treated last week also better. [Doxycycline] 07/28/17 better surface today with now 2 [name] of Iodoflex. Significant cellulitis treated with doxycycline. He has now completed the doxycycline and he is back to his usual degree of chronic venous inflammation/stasis dermatitis. He reminds me he has had ablations surgery here 08/04/17; continued improvement with Iodoflex to the left lateral leg wound in terms of the surface of the wound although the dimensions are better. 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 Problematic areas on the plantar aspect of the first second toe web space on the left and the dorsal aspect of the first second toe web space on the right. At one point I felt these were probably related to chronic fungal infections in treated him  aggressively for this although we have not made any improvement here. 08/11/17; left lateral leg. Surface continues to improve with the Iodoflex although we are not seeing much improvement in overall wound dimensions. Areas on his plantar left foot and right foot show no improvement. In fact the right foot looks somewhat worse 08/18/17; left lateral leg. We changed to [facility] Blue last week after [name] prolonged course of Iodoflex which helps get the surface better. It appears that the wound with is improved. Continue with difficult areas on the left dorsal first second and plantar first second on the right 09/01/17; patient arrives in clinic today having had [name] temperature of 103 yesterday. He was seen in the ER and [facility]. The patient was concerned he could have cellulitis again in the right leg however they diagnosed him with [name] UTI and he is now on Keflex. He has [name] history of cellulitis which is been recurrent and difficult but this is been in the left leg, in the past 5 use doxycycline. He does in  and out catheterizations at home which are risk factors for UTI 09/08/17; patient will be completing his Keflex this weekend. The erythema on the left leg is considerably better. He has [name] new wound today on the medial part of the right leg small superficial almost looks like [name] skin tear. He has worsening of the area on the right dorsal first and second toe. His major area on the left lateral leg is better. Using Hydrofera Blue on all areas 09/15/17; gradual reduction in width on the long wound in the left lateral leg. No debridement required. He also has wounds on the plantar aspect of his left first second toe web space and on the dorsal aspect of the right first second toe web space. 09/22/17; there continues to be very gradual improvements in the dimensions of the left lateral leg wound. He hasn't round erythematous spot with might be pressure on his wheelchair. There is no evidence obviously of infection  no purulence no warmth ooHe has [name] dry scaled area on the plantar aspect of the left first second toe ooImproved area on the dorsal right first second toe. Younker, IZAYAH MINER ([id number]) [id number] Page 23 of 37 09/29/17; left lateral leg wound continues to improve in dimensions mostly with an is still [name] fairly long but increasingly narrow wound. ooHe has [name] dry scaled area on the plantar aspect of his left first second toe web space ooIncreasingly concerning area on the dorsal right first second toe. In fact I am concerned today about possible cellulitis around this wound. The areas extending up his second toe and although there is deformities here almost appears to abut on the nailbed. 10/06/17; left lateral leg wound continues to make very gradual progress. Tissue culture I did from the right first second toe dorsal foot last time grew MRSA and enterococcus which was vancomycin sensitive. This was not sensitive to clindamycin or doxycycline. He is allergic to Zyvox and sulfa we have therefore arrange for him to have dalvance infusion tomorrow. He is had this in the past and tolerated it well 10/20/17; left lateral leg wound continues to make decent progress. This is certainly reduced in terms of with there is advancing epithelialization.ooThe cellulitis in the right foot looks better although he still has [name] deep wound in the dorsal aspect of the first second toe web space. Plantar left first toe web space on the left I think is making some progress 10/27/17; left lateral leg wound continues to make decent progress. Advancing epithelialization.using Hydrofera Blue ooThe right first second toe web space wound is better-looking using silver alginate ooImprovement in the left plantar first second toe web space. Again using silver alginate 11/03/17 left lateral leg wound continues to make decent progress albeit slowly. Using Hydrofera Blue ooThe right per second toe web  space continues to be [name] very problematic looking punched out wound. I obtained [name] piece of tissue for deep culture I did extensively treated this for fungus. It is difficult to imagine that this is [name] pressure area as the patient states other than going outside he doesn't really wear shoes at home ooThe left plantar first second toe web space looked fairly senescent. Necrotic edges. This required debridement oochange to Hydrofera Blue to all wound areas 11/10/17; left lateral leg wound continues to contract. Using Hydrofera Blue ooOn the right dorsal first second toe web space dorsally. Culture I did of this area last week grew MRSA there is not an easy oral option in this patient was multiple antibiotic  allergies or intolerances. This was only [name] rare culture isolate I'm therefore going to use Bactroban under silver alginate ooOn the left plantar first second toe web space. Debridement is required here. This is also unchanged 11/17/17; left lateral leg wound continues to contract using Hydrofera Blue this is no longer the major issue. ooThe major concern here is the right first second toe web space. He now has an open area going from dorsally to the plantar aspect. There is now wound on the inner lateral part of the first toe. Not [name] very viable surface on this. There is erythema spreading medially into the forefoot. ooNo major change in the left first second toe plantar wound 11/24/17; left lateral leg wound continues to contract using Hydrofera Blue. Nice improvement today ooThe right first second toe web space all of this looks [name] lot less angry than last week. I have given him clindamycin and topical Bactroban for MRSA and terbinafine for the possibility of underlining tinea pedis that I could not control with ketoconazole. Looks somewhat better ooThe area on the plantar left first second toe web space is weeping with dried debris around the wound 12/01/17; left lateral leg wound continues to  contract he Hydrofera Blue. It is becoming thinner in terms of with nevertheless it is making good improvement. ooThe right first second toe web space looks less angry but still [name] large necrotic-looking wounds starting on the plantar aspect of the right foot extending between the toes and now extensively on the base of the right second toe. I gave him clindamycin and topical Bactroban for MRSA anterior benefiting for the possibility of underlying tinea pedis. Not looking better today ooThe area on the left first/second toe looks better. Debrided of necrotic debris 12/05/17* the patient was worked in urgently today because over the weekend he found blood on his incontinence bad when he woke up. He was found to have an ulcer by his wife who does most of his wound care. He came in today for [place] to look at this. He has not had [name] history of wounds in his buttocks in spite of his paraplegia. 12/08/17; seen in follow-up today at his usual appointment. He was seen earlier this week and found to have [name] new wound on his buttock. We also follow him for wounds on the left lateral leg, left first second toe web space and right first second toe web space 12/15/17; we have been using Hydrofera Blue to the left lateral leg which has improved. The right first second toe web space has also improved. Left first second toe web space plantar aspect looks stable. The left buttock has worsened using Santyl. Apparently the buttock has drainage 12/22/17; we have been using Hydrofera Blue to the left lateral leg which continues to improve now 2 small wounds separated by normal skin. He tells [place] he had [name] fever up to 100 yesterday he is prone to UTIs but has not noted anything different. He does in and out catheterizations. The area between the first and second toes today does not look good necrotic surface covered with what looks to be purulent drainage and erythema extending into the third toe. I had gotten this to something that  I thought look better last time however it is not look good today. He also has [name] necrotic surface over the buttock wound which is expanded. I thought there might be infection under here so I removed [name] lot of the surface with [name] #5 curet though nothing look  like it really needed culturing. He is been using Santyl to this area 12/27/17; his original wound on the left lateral leg continues to improve using Hydrofera Blue. I gave him samples of Baxdella although he was unable to take them out of fear for an allergic reaction ["lump in his throat"].the culture I did of the purulent drainage from his second toe last week showed both enterococcus and [name] set Enterobacter I was also concerned about the erythema on the bottom of his foot although paradoxically although this looks somewhat better today. 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 We've been using silver alginate here. They came up today that he sleeps in [name] recliner, I'm not sure why but I asked him to stop this 01/03/18; his original wound we've been using Hydrofera Blue is now separated into 2 areas. ooUlcer on his left buttock is better he is off the recliner and sleeping in bed ooFinally both wound areas between his first and second toes also looks some better 01/10/18; his original wound on the left lateral leg is now separated into 2 wounds we've been using Hydrofera Blue ooUlcer on his left buttock has some drainage. There is [name] small probing site going into muscle layer superiorly.using silver alginate -He arrives today with [name] deep tissue injury on the left heel ooThe wound on the dorsal aspect of his first second toe on the left looks [name] lot betterusing silver alginate ketoconazole ooThe area on the first second toe web space on the right also looks [name] lot bette 01/17/18; his original wound on the left lateral leg continues to progress using  Hydrofera Blue ooUlcer on his left buttock also is smaller surface healthier except for [name] small probing site going into the muscle layer superiorly. 2.4 cm of tunneling in this area ooDTI on his left heel we have only been offloading. Looks better than last week no threatened open no evidence of infection oothe wound on the dorsal aspect of the first second toe on the left continues to look like it's regressing we have only been using silver alginate and terbinafine orally ooThe area in the first second toe web space on the right also looks to be [name] lot better using silver alginate and terbinafine I think this was prompted by tinea pedis 01/31/18; the patient was hospitalized in [place] last week apparently for [name] complicated UTI. He was discharged on cefepime he does in and out catheterizations. In the hospital he was discovered M I don't mild elevation of AST and ALT and the terbinafine was stopped.predictably the pressure ulcer on s his buttock looks betterusing silver alginate. The area on the left lateral leg also is better using Hydrofera Blue. The area between the first and second toes on the left better. First and second toes on the right still substantial but better. Finally the DTI on the left heel has held together and looks like it's resolving 02/07/18-he is here in follow-up evaluation for multiple ulcerations. He has new injury to the lateral aspect of the last issue [name] pressure ulcer, he states this is from adhesive removal trauma. He states he has tried multiple adhesive products with no success. All other ulcers appear stable. The left heel DTI is resolving. We will continue with same treatment plan and follow-up next week. 02/14/18; follow-up for multiple areas. ooHe has [name] new area last week on the lateral aspect of his pressure ulcer more  over the posterior trochanter. The original pressure ulcer looks quite stable has healthy granulation. 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 Almost fully epithelialized on the original superior area using Hydrofera Blue ooDTI on the left heel has peeled off this week to reveal [name] small superficial wound under denuded skin and subcutaneous tissue ooBoth areas between the first and second toes look better including nothing open on the left 02/21/18; ooThe patient's wounds on his left ischial tuberosity and posterior left greater trochanter actually looked better. He has [name] large area of irritation around the area Ishikawa, [place] ([id number]) [id number] Page 24 of 37 which I think is contact dermatitis. I am doubtful that this is fungal ooHis original wound on the left lateral calf continues to improve we have been using Hydrofera Blue ooThere is no open area in the left first second toe web space although there is [name] lot of thick callus ooThe DTI on the left heel required debridement today of necrotic surface eschar and subcutaneous tissue using silver alginate ooFinally the area on the right first second toe webspace continues to contract using silver alginate and ketoconazole 02/28/18 ooLeft ischial tuberosity wounds look better using silver alginate. ooOriginal wound on the left calf only has one small open area left using Hydrofera Blue ooDTI on the left heel required debridement mostly removing skin from around this wound surface. Using silver alginate ooThe areas on the right first/second toe web space using silver alginate and ketoconazole 03/08/18 on evaluation today patient appears to be doing decently well as best I can tell in regard to his wounds. This is the first time that I have seen him as he generally is followed by Dr. Dellia Nims. With that being said none of his wounds appear to be infected he does have an area where there is some skin covering what appears to be [name] new wound on the left  dorsal surface of his great toe. This is right at the nail bed. With that being said I do believe that debrided away some of the excess skin can be of benefit in this regard. Otherwise he has been tolerating the dressing changes without complication. 03/14/18; patient arrives today with the multiplicity of wounds that we are following. He has not been systemically unwell ooOriginal wound on the left lateral calf now only has 2 small open areas we've been using Hydrofera Blue which should continue ooThe deep tissue injury on the left heel requires debridement today. We've been using silver alginate ooThe left first second toe and the right first second toe are both are reminiscence what I think was tinea pedis. Apparently some of the callus Surface between the toes was removed last week when it started draining. ooPurulent drainage coming from the wound on the ischial tuberosity on the left. 03/21/18-He is here in follow-up evaluation for multiple wounds. 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 He tolerated debridement. The only change to last week's recommendations is to discontinue antifungal cream between toes. He will follow-up next week 03/28/18; following up for multiple wounds;Concern this week is streaking redness and swelling in the right foot. He is going to need antibiotics for this. 03/31/18; follow-up for right foot cellulitis. Streaking redness and swelling in the right foot on 03/28/18. 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 I put him on clindamycin 300 mg  every 6 and brought him in for [name] quick check. He has an open wound between his first and second toes on the right foot as [name] potential source. 04/04/18; ooRight foot cellulitis is resolving he is completing clindamycin. This is truly good news ooLeft lateral calf wound which is initial wound only has one small open area inferiorly this  is close to healing out. He has compression stockings. We will use Hydrofera Blue right down to the epithelialization of this ooNonviable surface on the left heel which was initially pressure with [name] DTI. We've been using Hydrofera Blue. I'm going to switch this back to silver alginate ooLeft first second toe/tinea pedis this looks better using silver alginate ooRight first second toe tinea pedis using silver alginate ooLarge pressure ulcers on theLeft ischial tuberosity. Small wound here Looks better. I am uncertain about the surface over the large wound. Using silver alginate 04/11/18; ooCellulitis in the right foot is resolved ooLeft lateral calf wound which was his original wounds still has 2 tiny open areas remaining this is just about closed ooNonviable surface on the left heel is better but still requires debridement ooLeft first second toe/tinea pedis still open using silver alginate ooRight first second toe wound tinea pedis I asked him to go back to using ketoconazole and silver alginate ooLarge pressure ulcers on the left ischial tuberosity this shear injury here is resolved. Wound is smaller. No evidence of infection using silver alginate 04/18/18; ooPatient arrives with an intense area of cellulitis in the right mid lower calf extending into the right heel area. Bright red and warm. Smaller area on the left anterior leg. He has [name] significant history of MRSA. He will definitely need antibioticsoodoxycycline ooHe now has 2 open areas on the left ischial tuberosity the original large wound and now [name] satellite area which I think was above his initial satellite areas. Not [name] wonderful surface on this satellite area surrounding erythema which looks like pressure related. ooHis left lateral calf wound again his original wound is just about closed ooLeft heel pressure injury still requiring debridement ooLeft first second toe looks [name] lot better using silver alginate ooRight  first second toe also using silver alginate and ketoconazole cream also looks better 04/20/18; the patient was worked in early today out of concerns with his cellulitis on the right leg. I had started him on doxycycline. This was 2 days ago. His wife was concerned about the swelling in the area. Also concerned about the left buttock. He has not been systemically unwell no fever chills. No nausea vomiting or diarrhea 04/25/18; the patient's left buttock wound is continued to deteriorate he is using Hydrofera Blue. He is still completing clindamycin for the cellulitis on the right leg although all of this looks better. 05/02/18 ooLeft buttock wound still with [name] lot of drainage and [name] very tightly adherent fibrinous necrotic surface. He has [name] deeper area superiorly ooThe left lateral calf wound is still closed ooDTI wound on the left heel necrotic surface especially the circumference using Iodoflex ooAreas between his left first second toe and right first second toe both look better. Dorsally and the right first second toe he had [name] necrotic surface although at smaller. In using silver alginate and ketoconazole. I did [name] culture last week which was [name] deep tissue culture of the reminiscence of the open wound on the right first second toe dorsally. This grew [name] few Acinetobacter and [name] few methicillin-resistant staph aureus. Nevertheless the area actually this week looked better.  I didn't feel the need to specifically address this at least in terms of systemic antibiotics. 05/09/18; wounds are measuring larger more drainage per our intake. We are using Santyl covered with alginate on the large superficial buttock wounds, Iodosorb on the left heel, ketoconazole and silver alginate to the dorsal first and second toes bilaterally. 05/16/18; ooThe area on his left buttock better in some aspects although the area superiorly over the ischial tuberosity required an extensive debridement.using Santyl ooLeft heel  appears stable. Using Iodoflex ooThe areas between his first and second toes are not bad however there is spreading erythema up the dorsal aspect of his left foot this looks like cellulitis again. He is insensate the erythema is really very brilliant.o Erysipelas He went to see an allergist days ago because he was itching part of this he had lab work done. This showed [name] white count of 15.1 with 70% neutrophils. Hemoglobin of 11.4 and [name] platelet count of 659,000. Last white count we had in Epic was [name] 2-1/2 years ago which was 25.9 but he was ill at the time. He was able to show me some lab work that was done by his primary physician the pattern is about the same. 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 But prompted me to go ahead and do x-rays of both feet and the pelvis rule out osteomyelitis. He also had [name] comprehensive metabolic panel this was reasonably normal his albumin was 3.7 liver function tests BUN/creatinine all normal 05/23/18; x-rays of both his feet from last week were negative for underlying pulmonary abnormality. The x-ray of his pelvis however showed mild irregularity in the left ischial which may represent some early osteomyelitis. The wound in the left ischial continues to get deeper clearly now exposed muscle. Each week necrotic surface material over this area. Whereas the rest of the wounds do not look so bad. ooThe left ischial wound we have been using Santyl and calcium alginate ooT the left heel surface necrotic debris using Iodoflex o ooThe left lateral leg is still healed ooAreas on the left dorsal foot and the right dorsal foot are about the same. There is some inflammation on the left which might represent contact dermatitis, fungal dermatitis I am doubtful cellulitis although this looks better than last week 05/30/18; CT scan done at Hospital did not show any osteomyelitis or abscess. Suggested the possibility of underlying  cellulitis although I don't see [name] lot of evidence of this at the bedside ooThe wound itself on the left buttock/upper thigh actually looks somewhat better. No debridement Sundberg, Taeveon E ([id number]) [id number] Page 25 of 37 ooLeft heel also looks better no debridement continue Iodoflex ooBoth dorsal first second toe spaces appear better using Lotrisone. Left still required debridement 06/06/18; ooIntake reported some purulent looking drainage from the left gluteal wound. Using Santyl and calcium alginate ooLeft heel looks better although still [name] nonviable surface requiring debridement ooThe left dorsal foot first/second webspace actually expanding and somewhat deeper. I may consider doing [name] shave biopsy of this area ooRight dorsal foot first/second webspace appears stable to improved. Using Lotrisone and silver alginate to both these areas 06/13/18 ooLeft gluteal surface looks better. Now separated in the 2 wounds. No debridement required. Still drainage. We'll continue silver alginate ooLeft heel continues to look better with Iodoflex continue this for at least another week ooOf his dorsal foot wounds the area on the left still has some depth although it looks better  than last week. We've been using Lotrisone and silver alginate 06/20/18 ooLeft gluteal continues to look better healthy tissue ooLeft heel continues to look better healthy granulation wound is smaller. He is using Iodoflex and his long as this continues continue the Iodoflex ooDorsal right foot looks better unfortunately dorsal left foot does not. There is swelling and erythema of his forefoot. He had minor trauma to this several days ago but doesn't think this was enough to have caused any tissue injury. Foot looks like cellulitis, we have had this problem before 06/27/18 on evaluation today patient appears to be doing [name] little worse in regard to his foot ulcer. Unfortunately it does appear that  he has methicillin-resistant staph aureus and unfortunately there really are no oral options for him as he's allergic to sulfa drugs as well as I box. Both of which would really be his only options for treating this infection. In the past he has been given and effusion of Orbactiv. This is done very well for him in the past again it's one time dosing IV antibiotic therapy. Subsequently I do believe this is something we're gonna need to see about doing at this point in time. Currently his other wounds seem to be doing somewhat better in my pinion I'm pretty happy in that regard. 07/03/18 on evaluation today patient's wounds actually appear to be doing fairly well. He has been tolerating the dressing changes without complication. All in all he seems to be showing signs of improvement. In regard to the antibiotics he has been dealing with infectious disease since I saw him last week as far as getting this scheduled. In the end [facility]'s [facility] this coming Friday. In the meantime he has been continuing to perform the dressing changes in such as previous. There does not appear to be any evidence of infection worsengin at this time. 07/10/18; ooSince I last saw this man 2 [name] ago things have actually improved. IV antibiotics of resulted in less forefoot erythema although there is still some present. He is not systemically unwell ooLeft buttock wounds o2 now have no depth there is increased epithelialization Using silver alginate ooLeft heel still requires debridement using Iodoflex ooLeft dorsal foot still with [name] sizable wound about the size of [name] border but healthy granulation ooRight dorsal foot still with [name] slitlike area using silver alginate 07/18/18; the patient's cellulitis in the left foot is improved in fact I think it is on its way to resolving. ooLeft buttock wounds o2 both look better although the larger one has hypertension  granulation we've been using silver alginate ooLeft heel has some thick circumferential redundant skin over the wound edge which will need to be removed today we've been using Iodoflex ooLeft dorsal foot is still [name] sizable wound required debridement using silver alginate ooThe right dorsal foot is just about closed only [name] small open area remains here 07/25/18; left foot cellulitis is resolved ooLeft buttock wounds o2 both look better. Hyper-granulation on the major area ooLeft heel as some debris over the surface but otherwise looks [name] healthier wound. Using silver collagen ooRight dorsal foot is just about closed 07/31/18; arrives with our intake nurse worried about purulent drainage from the buttock. We had hyper-granulation here last week ooHis buttock wounds o2 continue to look better ooLeft heel some debris over the surface but measuring smaller. ooRight dorsal foot unfortunately has openings between the toes ooLeft foot superficial wound looks less  aggravated. 08/07/18 ooButtock wounds continue to look better although some of her granulation and the larger medial wound. silver alginate ooLeft heel continues to look [name] lot better.silver collagen ooLeft foot superficial wound looks less stable. Requires debridement. He has [name] new wound superficial area on the foot on the lateral dorsal foot. ooRight foot looks better using silver alginate without Lotrisone 08/14/2018; patient was in the ER last week diagnosed with [name] UTI. He is now on Cefpodoxime and Macrodantin. ooButtock wounds continued to be smaller. Using silver alginate ooLeft heel continues to look better using silver collagen ooLeft foot superficial wound looks as though it is improving ooRight dorsal foot area is just about healed. 08/21/2018; patient is completed his antibiotics for his UTI. ooHe has 2 open areas on the buttocks. There is still not closed although the surface looks satisfactory. Using silver  alginate ooLeft heel continues to improve using silver collagen ooThe bilateral dorsal foot areas which are at the base of his first and second toes/possible tinea pedis are actually stable on the left but worse on the right. The area on the left required debridement of necrotic surface. After debridement I obtained [name] specimen for PCR culture. ooThe right dorsal foot which is been just about healed last week is now reopened 08/28/2018; culture done on the left dorsal foot showed coag negative staph both staph epidermidis and Lugdunensis. I think this is worthwhile initiating systemic treatment. I will use doxycycline given his long list of allergies. The area on the left heel slightly improved but still requiring debridement. ooThe large wound on the buttock is just about closed whereas the smaller one is larger. Using silver alginate in this area 09/04/2018; patient is completing his doxycycline for the left foot although this continues to be [name] very difficult wound area with very adherent necrotic debris. We are using silver alginate to all his wounds right foot left foot and the small wounds on his buttock, silver collagen on the left heel. 09/11/2018; once again this patient has intense erythema and swelling of the left forefoot. Lesser degrees of erythema in the right foot. He has [name] long list of allergies and intolerances. I will reinstitute doxycycline. 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 Using silver alginate ooLeft heel also looks better using silver collagen ooUnfortunately both the areas on his feet look worse. The area on the left first second webspace is now gone through to the plantar part of his foot. The area on the left foot anteriorly is irritated with erythema and swelling in the forefoot. 09/25/2018 ooHis wound on the left plantar heel looks better. Using silver collagen ooThe area on the left buttock 2 small remnant  areas. One is closed one is still open. Using silver alginate ooThe areas between both his first and second toes look worse. This in spite of long-standing antifungal therapy with ketoconazole and silver alginate which should have antifungal activity ooHe has small areas around his original wound on the left calf one is on the bottom of the original scar tissue and one superiorly both of these are small and superficial but again given wound history in this site this is worrisome 10/02/2018 ooLeft plantar heel continues to gradually contract using silver collagen ooLeft buttock wound is unchanged using silver alginate ooThe areas on his dorsal feet between his first and second toes bilaterally look about the same. 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 Small open area here. We have been using silver alginate 10/09/2018; Whitworth, Keaundre E ([id number]) [id number] Page 26 of 37 ooLeft plantar heel continues to look healthy and contract. No debridement is required ooLeft buttock slightly smaller with [name] tape injury wound just below which was new this week ooDorsal feet somewhat improved I have been using clindamycin ooLeft lateral looks lower extremity the actual open area looks worse although [name] lot of this is epithelialized. I am going to change to silver collagen today He has [name] lot more swelling in the right leg although this is not pitting not red and not particularly warm there is [name] lot of spasm in the right leg usually indicative of people with paralysis of some underlying discomfort. We have reviewed his vascular status from 2017 he had [name] left greater saphenous vein ablation. I wonder about referring him back to vascular surgery if the area on the left leg continues to  deteriorate. 10/16/2018 in today for follow-up and management of multiple lower extremity ulcers. His left Buttock wound is much lower smaller and almost closed completely. The wound to the left ankle has began to reopen with Epithelialization and some adherent slough. He has multiple new areas to the left foot and leg. The left dorsal foot without much improvement. Wound present between left great webspace and 2nd toe. Erythema and edema present right leg. Right LE ultrasound obtained on 10/10/18 was negative for DVT . 10/23/2018; ooLeft buttock is closed over. Still dry macerated skin but there is no open wound. I suspect this is chronic pressure/moisture ooLeft lateral calf is quite [name] bit worse than when I saw this last. There is clearly drainage here he has macerated skin into the left plantar heel. We will change the primary dressing to alginate ooLeft dorsal foot has some improvement in overall wound area. Still using clindamycin and silver alginate ooRight dorsal foot about the same as the left using clindamycin and silver alginate ooThe erythema in the right leg has resolved. He is DVT rule out was negative ooLeft heel pressure area required debridement although the wound is smaller and the surface is health 10/26/2018 ooThe patient came back in for his nurse check today predominantly because of the drainage coming out of the left lateral leg with [name] recent reopening of his original wound on the left lateral calf. He comes in today with [name] large amount of surrounding erythema around the wound extending from the calf into the ankle and even in the area on the dorsal foot. He is not systemically unwell. He is not febrile. Nevertheless this looks like cellulitis. We have been using silver alginate to the area. I changed him to [name] regular visit and I am going to prescribe him doxycycline. The rationale here is [name] long list of medication intolerances and [name] history of MRSA. I did not see  anything that I thought would provide [name] valuable culture 10/30/2018 ooFollow-up from his appointment 4 days ago with really an extensive area of cellulitis in the left calf left lateral ankle and left dorsal foot. I put him on doxycycline. He has [name] long list of medication allergies which are true allergy reactions. 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 In any case he is [name] lot better today. The erythema especially in the anterior and lateral  left calf is better. He still has left ankle erythema. He also is complaining about increasing edema in the right leg we have only been using Kerlix Coban and he has been doing the wraps at home. Finally he has [name] spotty rash on the medial part of his upper left calf which looks like folliculitis or perhaps wrap occlusion type injury. Small superficial macules not pustules 11/06/18 patient arrives today with again [name] considerable degree of erythema around the wound on the left lateral calf extending into the dorsal ankle and dorsal foot. This is [name] lot worse than when I saw this last week. He is on doxycycline really with not [name] lot of improvement. He has not been systemically unwell Wounds on the; left heel actually looks improved. Original area on the left foot and proximity to the first and second toes looks about the same. He has superficial areas on the dorsal foot, anterior calf and then the reopening of his original wound on the left lateral calf which looks about the same ooThe only area he has on the right is the dorsal webspace first and second which is smaller. ooHe has [name] large area of dry erythematous skin on the left buttock small open area here. 11/13/2018; the patient arrives in much better condition. The erythema around the wound on the left lateral calf is [name] lot better. Not sure whether this was the clindamycin or the TCA and ketoconazole or just in the improvement in edema  control [stasis dermatitis]. In any case this is [name] lot better. The area on the left heel is very small and just about resolved using silver collagen we have been using silver alginate to the areas on his dorsal feet 11/20/2018; his wounds include the left lateral calf, left heel, dorsal aspects of both feet just proximal to the first second webspace. He is stable to slightly improved. I did not think any changes to his dressings were going to be necessary 11/27/2018 he has [name] reopening on the left buttock which is surrounded by what looks like tinea or perhaps some other form of dermatitis. The area on the left dorsal foot has some erythema around it I have marked this area but I am not sure whether this is cellulitis or not. Left heel is not closed. Left calf the reopening is really slightly longer and probably worse 1/13; in general things look better and smaller except for the left dorsal foot. Area on the left heel is just about closed, left buttock looks better only [name] small wound remains in the skin looks better [using Lotrisone] 1/20; the area on the left heel only has [name] few remaining open areas here. Left lateral calf about the same in terms of size, left dorsal foot slightly larger right lateral foot still not closed. The area on the left buttock has no open wound and the surrounding skin looks [name] lot better 1/27; the area on the left heel is closed. Left lateral calf better but still requiring extensive debridements. The area on his left buttock is closed. He still has the open areas on the left dorsal foot which is slightly smaller in the right foot which is slightly expanded. We have been using Iodoflex on these areas as well 2/3; left heel is closed. Left lateral calf still requiring debridement using Iodoflex there is no open area on his left buttock however he has dry scaly skin over [name] large area of this. Not really responding well to the Lotrisone. Finally the areas  on his dorsal feet at the  level of the first second webspace are slightly smaller on the right and about the same on the left. Both of these vigorously debrided with Anasept and gauze 2/10; left heel remains closed he has dry erythematous skin over the left buttock but there is no open wound here. Left lateral leg has come in and with. Still requiring debridement we have been using Iodoflex here. Finally the area on the left dorsal foot and right dorsal foot are really about the same extremely dry callused fissured areas. He does not yet have [name] dermatology appointment 2/17; left heel remains closed. He has [name] new open area on the left buttock. The area on the left lateral calf is bigger longer and still covered in necrotic debris. No major change in his foot areas bilaterally. I am awaiting for [name] dermatologist to look on this. We have been using ketoconazole I do not know that this is been doing any good at all. 2/24; left heel remains closed. The left buttock wound that was new reopening last week looks better. The left lateral calf appears better also although still requires debridement. The major area on his foot is the left first second also requiring debridement. We have been putting Prisma on all wounds. I do not believe that the ketoconazole has done too much good for his feet. He will use Lotrisone I am going to give him [name] 2-week course of terbinafine. We still do not have [name] dermatology appointment 3/2 left heel remains closed however there is skin over bone in this area I pointed this out to him today. The left buttock wound is epithelialized but still does not look completely stable. The area on the left leg required debridement were using silver collagen here. With regards to his feet we changed to Lotrisone last week and silver alginate. 3/9; left heel remains closed. Left buttock remains closed. The area on the right foot is essentially closed. The left foot remains unchanged. Slightly smaller on the left lateral  calf. Using silver collagen to both of these areas 3/16-Left heel remains closed. Area on right foot is closed. Left lateral calf above the lateral malleolus open wound requiring debridement with easy bleeding. Left dorsal wound proximal to first toe also debrided. Left ischial area open new. Patient has been using Prisma with wrapping every 3 days. Dermatology appointment is apparently tomorrow.Patient has completed his terbinafine 2-week course with some apparent improvement according to him, there is still flaking and dry skin in his foot on the left 3/23; area on the right foot is reopened. The area on the left anterior foot is about the same still [name] very necrotic adherent surface. He still has the area on the left leg and reopening is on the left buttock. He apparently saw dermatology although I do not have [name] note. According to the patient who is usually fairly well informed they did not have any good ideas. Put him on oral terbinafine which she is been on before. 3/30; using silver collagen to all wounds. Apparently his dermatologist put him on doxycycline and rifampin presumably some culture grew staph. I do not have this result. He remains on terbinafine although I have used terbinafine on him before 4/6; patient has had [name] fairly substantial reopening on the right foot between the first and second toes. He is finished his terbinafine and I believe is on doxycycline and rifampin still as prescribed by dermatology. We have been using silver collagen to all  his wounds although the patient reports that he thinks silver alginate does better on the wounds on his buttock. 4/13; the area on his left lateral calf about the same size but it did not require debridement. ooLeft dorsal foot just proximal to the webspace between the first and second toes is about the same. Still nonviable surface. I note some superficial bronze Prosise, Jamarii E ([id number]) [id number] Page 27 of  37 discoloration of the dorsal part of his foot ooRight dorsal foot just proximal to the first and second toes also looks about the same. I still think there may be the same discoloration I noted above on the left ooLeft buttock wound looks about the same 4/20; left lateral calf appears to be gradually contracting using silver collagen. 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 The left dorsal foot wound is debrided of tightly adherent necrotic debris and really cleans up quite nicely. The right area is worse with expansion. I did not debride this it is now over the base of the second toe ooThe area on his left buttock is smaller no debridement is required using silver collagen 5/4; left calf continues to make good progress. ooHe arrives with erythema around the wounds on his dorsal foot which even extends to the plantar aspect. Very concerning for coexistent infection. He is finished the erythromycin I gave him for possible erythrasma this does not seem to have helped. ooThe area on the left foot is about the same base of the dorsal toes ooIs area on the buttock looks improved on the left 5/11; left calf and left buttock continued to make good progress. Left foot is about the same to slightly improved. ooMajor problem is on the right foot. He has not had an x-ray. Deep tissue culture I did last week showed both Enterobacter and E. coli. I did not change the doxycycline I put him on empirically although neither 1 of these were plated to doxycycline. He arrives today with the erythema looking worse on both the dorsal and plantar foot. Macerated skin on the bottom of the foot. he has not been systemically unwell 5/18-Patient returns at 1 week, left calf wound appears to be making some progress, left buttock wound appears slightly worse than last time, left foot wound looks slightly better, right foot redness is marginally better.  X-ray of both feet show no air or evidence of osteomyelitis. Patient is finished his Omnicef and terbinafine. He continues to have macerated skin on the bottom of the left foot as well as right 5/26; left calf wound is better, left buttock wound appears to have multiple small superficial open areas with surrounding macerated skin. X-rays that I did last time showed no evidence of osteomyelitis in either foot. He is finished cefdinir and doxycycline. I do not think that he was on terbinafine. He continues to have [name] large superficial open area on the right foot anterior dorsal and slightly between the first and second toes. I did send him to dermatology 2 months ago or so wondering about whether they would do [name] fungal scraping. I do not believe they did but did do [name] culture. We have been using silver alginate to the toe areas, he has been using antifungals at home topically either ketoconazole or Lotrisone. We are using silver collagen on the left foot, silver alginate on the right, silver collagen on the left lateral leg and silver alginate on the left buttock 6/1; left buttock area is healed. We  have the left dorsal foot, left lateral leg and right dorsal foot. We are using silver alginate to the areas on both feet and silver collagen to the area on his left lateral calf 6/8; the left buttock apparently reopened late last week. He is not really sure how this happened. He is tolerating the terbinafine. Using silver alginate to all wounds 6/15; left buttock wound is larger than last week but still superficial. ooCame in the clinic today with [name] report of purulence from the left lateral leg I did not identify any infection ooBoth areas on his dorsal feet appear to be better. He is tolerating the terbinafine. Using silver alginate to all wounds 6/22; left buttock is about the same this week, left calf quite [name] bit better. His left foot is about the same however he comes in with erythema and warmth in  the right forefoot once again. Culture that I gave him in the beginning of May showed Enterobacter and E. coli. I gave him doxycycline and things seem to improve although neither 1 of these organisms was specifically plated. 6/29; left buttock is larger and dry this week. Left lateral calf looks to me to be improved. Left dorsal foot also somewhat improved right foot completely unchanged. The erythema on the right foot is still present. He is completing the Ceftin dinner that I gave him empirically [see discussion above.) 7/6 - All wounds look to be stable and perhaps improved, the left buttock wound is slightly smaller, per patient bleeds easily, completed ceftin, the right foot redness is less, he is on terbinafine 7/13; left buttock wound about the same perhaps slightly narrower. Area on the left lateral leg continues to narrow. Left dorsal foot slightly smaller right foot about the same. We are using silver alginate on the right foot and Hydrofera Blue to the areas on the left. Unna boot on the left 2 layer compression on the right 7/20; left buttock wound absolutely the same. Area on lateral leg continues to get better. Left dorsal foot require debridement as did the right no major change in the 7/27; left buttock wound the same size necrotic debris over the surface. The area on the lateral leg is closed once again. His left foot looks better right foot about the same although there is some involvement now of the posterior first second toe area. He is still on terbinafine which I have given him for [name] month, not certain [name] centimeter major change 06/25/19-All wounds appear to be slightly improved according to report, left buttock wound looks clean, both foot wounds have minimal to no debris the right dorsal foot has minimal slough. We are using Hydrofera Blue to the left and silver alginate to the right foot and ischial wound. 8/10-Wounds all appear to be around the same, the right forefoot distal  part has some redness which was not there before, however the wound looks clean and small. Ischial wound looks about the same with no changes 8/17; his wound on the left lateral calf which was his original chronic venous insufficiency wound remains closed. Since I last saw him the areas on the left dorsal foot right dorsal foot generally appear better but require debridement. The area on his left initial tuberosity appears somewhat larger to me perhaps hyper granulated and bleeds very easily. We have been using Hydrofera Blue to the left dorsal foot and silver alginate to everything else 8/24; left lateral calf remains closed. The areas on his dorsal feet on the webspace of the  first and second toes bilaterally both look better. The area on the left buttock which is the pressure ulcer stage II slightly smaller. I change the dressing to Hydrofera Blue to all areas 8/31; left lateral calf remains closed. The area on his dorsal feet bilaterally look better. Using Hydrofera Blue. Still requiring debridement on the left foot. No change in the left buttock pressure ulcers however 9/14; left lateral calf remains closed. Dorsal feet look quite [name] bit better than 2 [name] ago. Flaking dry skin also [name] lot better with the ammonium lactate I gave him 2 [name] ago. The area on the left buttock is improved. He states that his Roho cushion developed [name] leak and he is getting [name] new one, in the interim he is offloading this vigorously 9/21; left calf remains closed. Left heel which was [name] possible DTI looks better this week. He had macerated tissue around the left dorsal foot right foot looks satisfactory and improved left buttock wound. I changed his dressings to his feet to silver alginate bilaterally. Continuing Hydrofera Blue on the left buttock. 9/28 left calf remains closed. Left heel did not develop anything [possible DTI] dry flaking skin on the left dorsal foot. Right foot looks satisfactory. Improved left  buttock wound. We are using silver alginate on his feet Hydrofera Blue on the buttock. I have asked him to go back to the Lotrisone on his feet including the wounds and surrounding areas 10/5; left calf remains closed. The areas on the left and right feet about the same. [name] lot of this is epithelialized however debris over the remaining open areas. He is using Lotrisone and silver alginate. The area on the left buttock using Hydrofera Blue 10/26. Patient has been out for 3 [name] secondary to Covid concerns. He tested negative but I think his wife tested positive. He comes in today with the left foot substantially worse, right foot about the same. Even more concerning he states that the area on his left buttock closed over but then reopened and is considerably deeper in one aspect than it was before [stage III wound] 11/2; left foot really about the same as last week. Quarter sized wound on the dorsal foot just proximal to the first second toes. Surrounding erythema with areas of denuded epithelium. This is not really much different looking. Did not look like cellulitis this time however. ooRight foot area about the same.. We have been using silver alginate alginate on his toes ooLeft buttock still substantial irritated skin around the wound which I think looks somewhat better. We have been using Hydrofera Blue here. 11/9; left foot larger than last week and [name] very necrotic surface. Right foot I think is about the same perhaps slightly smaller. Debris around the circumference also addressed. Unfortunately on the left buttock there is been [name] decline. Satellite lesions below the major wound distally and now [name] an additional one posteriorly we have been using Hydrofera Blue but I think this is [name] pressure issue 11/16; left foot ulcer dorsally again [name] very adherent necrotic surface. Right foot is about the same. Not much change in the pressure ulcer on his left buttock. 11/30; left foot ulcer dorsally  basically the same as when I saw him 2 [name] ago. Very adherent fibrinous debris on the wound surface. Patient reports [name] lot of drainage as well. 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 We have been using Iodoflex, patient changed back to alginate because of the drainage.  Area on his right dorsal foot really looks benign with [name] healthier surface certainly [name] lot better than on the left. Left buttock wounds all improved using Hydrofera Blue 12/7; left dorsal foot again no improvement. Tightly adherent debris. PCR culture I did last week only showed likely skin contaminant. I have gone ahead and done [name] punch biopsy of this which is about the last thing in terms of investigations I can think to do. He has known venous insufficiency and venous Wierenga, Glendel E ([id number]) [id number] Page 28 of 37 hypertension and this could be the issue here. The area on the right foot is about the same left buttock slightly worse according to our intake nurse secondary to [facility] Blue sticking to the wound 12/14; biopsy of the left foot that I did last time showed changes that could be related to wound healing/chronic stasis dermatitis phenomenon no neoplasm. We have been using silver alginate to both feet. I change the one on the left today to Sorbact and silver alginate to his other 2 wounds 12/28; the patient arrives with the following problems; ooMajor issue is the dorsal left foot which continues to be [name] larger deeper wound area. Still with [name] completely nonviable surface ooParadoxically the area mirror image on the right on the right dorsal foot appears to be getting better. ooHe had some loss of dry denuded skin from the lower part of his original wound on the left lateral calf. Some of this area looked [name] little vulnerable and for this reason we put him in wrap that on this side this week ooThe area on his left buttock is larger. He  still has the erythematous circular area which I think is [name] combination of pressure, sweat. This does not look like cellulitis or fungal dermatitis 11/26/2019; -Dorsal left foot large open wound with depth. Still debris over the surface. Using Sorbact ooThe area on the dorsal right foot paradoxically has closed over [employer] [name]. [facility] has [name] reopening on the left ankle laterally at the base of his original wound that extended up into the calf. This appears clean. ooThe left buttock wound is smaller but with very adherent necrotic debris over the surface. We have been using silver alginate here as well The patient had arterial studies done in 2017. He had biphasic waveforms at the dorsalis pedis and posterior tibial bilaterally. ABI in the left was 1.17. Digit waveforms were dampened. He has slight spasticity in the great toes I do not think [name] TBI would be possible 1/11; the patient comes in today with [name] sizable reopening between the first and second toes on the right. This is not exactly in the same location where we have been treating wounds previously. According to our intake nurse this was actually fairly deep but 0.6 cm. The area on the left dorsal foot looks about the same the surface is somewhat cleaner using Sorbact, his MRI is in 2 days. We have not managed yet to get arterial studies. The new reopening on the left lateral calf looks somewhat better using alginate. The left buttock wound is about the same using alginate 1/18; the patient had his ARTERIAL studies which were quite normal. ABI in the right at 1.13 with triphasic/biphasic waveforms on the left ABI 1.06 again with triphasic/biphasic waveforms. It would not have been possible to have done [name] toe brachial index because of spasticity. We have been using Sorbac to the left foot alginate to the rest of his wounds on the right foot left lateral calf and  left buttock 1/25; arrives in clinic with erythema and swelling of the left forefoot worse over the  first MTP area. This extends laterally dorsally and but also posteriorly. Still has an area on the left lateral part of the lower part of his calf wound it is eschared and clearly not closed. ooArea on the left buttock still with surrounding irritation and erythema. ooRight foot surface wound dorsally. The area between the right and first and second toes appears better. 2/1; ooThe left foot wound is about the same. Erythema slightly better I gave him [name] week of doxycycline empirically ooRight foot wound is more extensive extending between the toes to the plantar surface ooLeft lateral calf really no open surface on the inferior part of his original wound however the entire area still looks vulnerable ooAbsolutely no improvement in the left buttock wound required debridement. 2/8; the left foot is about the same. Erythema is slightly improved I gave him clindamycin last week. ooRight foot looks better he is using Lotrimin and silver alginate ooHe has [name] breakdown in the left lateral calf. Denuded epithelium which I have removed ooLeft buttock about the same were using Hydrofera Blue 2/15; left foot is about the same there is less surrounding erythema. Surface still has tightly adherent debris which I have debriding however not making any progress ooRight foot has [name] substantial wound on the medial right second toe between the first and second webspace. ooStill an open area on the left lateral calf distal area. ooButtock wound is about the same 2/22; left foot is about the same less surrounding erythema. Surface has adherent debris. Polymen Ag Right foot area significant wound between the first and second toes. We have been using silver alginate here Left lateral leg polymen Ag at the base of his original venous insufficiency wound ooLeft buttock some improvement here 3/1; ooRight foot is deteriorating in the first second toe webspace. Larger and more substantial. We have been using  silver alginate. ooLeft dorsal foot about the same markedly adherent surface debris using PolyMem Ag ooLeft lateral calf surface debris using PolyMem AG ooLeft buttock is improved again using PolyMem Ag. ooHe is completing his terbinafine. The erythema in the foot seems better. He has been on this for 2 [name] 3/8; no improvement in any wound area in fact he has [name] small open area on the dorsal midfoot which is new this week. He has not gotten his foot x-rays yet 3/15; his x-rays were both negative for osteomyelitis of both feet. No major change in any of his wounds on the extremities however his buttock wounds are better. We have been using polymen on the buttocks, left lower leg. Iodoflex on the left foot and silver alginate on the right 3/22; arrives in clinic today with the 2 major issues are the improvement in the left dorsal foot wound which for once actually looks healthy with [name] nice healthy wound surface without debridement. Using Iodoflex here. Unfortunately on the left lateral calf which is in the distal part of his original wound he came to the clinic here for there was purulent drainage noted some increased breakdown scattered around the original area and [name] small area proximally. We we are using polymen here will change to silver alginate today. His buttock wound on the left is better and I think the area on the right first second toe webspace is also improved 3/29; left dorsal foot looks better. Using Iodoflex. Left ankle culture from deterioration last time grew E. coli, Enterobacter and  Enterococcus. I will give him [name] course of cefdinir although that will not cover Enterococcus. The area on the right foot in the webspace of the first and second toe lateral first toe looks better. The area on his buttock is about healed Vascular appointment is on April 21. This is to look at his venous system vis--vis continued breakdown of the wounds on the left including the left lateral leg and  left dorsal foot he. He has had previous ablations on this side 4/5; the area between the right first and second toes lateral aspect of the first toe looks better. Dorsal aspect of the left first toe on the left foot also improved. Unfortunately the left lateral lower leg is larger and there is [name] second satellite wound superiorly. The usual superficial abrasions on the left buttock overall better but certainly not closed 4/12; the area between the right first and second toes is improved. Dorsal aspect of the left foot also slightly smaller with [name] vibrant healthy looking surface. No real change in the left lateral leg and the left buttock wound is healed He has an unaffordable co-pay for Apligraf. Appointment with vein and vascular with regards to the left leg venous part of the circulation is on 4/21 4/19; we continue to see improvement in all wound areas. Although this is minor. He has his vascular appointment on 4/21. The area on the left buttock has not reopened although right in the center of this area the skin looks somewhat threatened 4/26; the left buttock is unfortunately reopened. In general his left dorsal foot has [name] healthy surface and looks somewhat smaller although it was not measured as such. The area between his first and second toe webspace on the right as [name] small wound against the first toe. The patient saw vascular surgery. The real question I was asking was about the small saphenous vein on the left. He has previously ablated left greater saphenous vein. Nothing further was commented on on the left. Right greater saphenous vein without reflux at the saphenofemoral junction or proximal thigh there was no indication for ablation of the right greater saphenous vein duplex was negative for DVT bilaterally. They did not think there was anything from [name] vascular surgery point of view that could be offered. They ABIs within normal limits 5/3; only small open area on the left buttock. The  area on the left lateral leg which was his original venous reflux is now 2 wounds both which look clean. We are using Iodoflex on the left dorsal foot which looks healthy and smaller. He is down to [name] very tiny area between the right first and second toes, using silver alginate 5/10; all of his wounds appear better. We have much better edema control in 4 layer compression on the left. This may be the factor that is allowing the left foot and left lateral calf to heal. He has external compression garments at home 04/14/20-All of his wounds are progressing well, the left forefoot is practically closed, left ischium appears to be about the same, right toe webspace is also Hinchey, Thaddeaus E ([id number]) [id number] Page 29 of 37 smaller. The left lateral leg is about the same, continue using Hydrofera Blue to this, silver alginate to the ischium, Iodoflex to the toe space on the right 6/7; most of his wounds outside of the left buttock are doing well. The area on the left lateral calf and left dorsal foot are smaller. The area on the right foot in between the  first and second toe webspace is barely visible although he still says there is some drainage here is the only reason I did not heal this out. ooUnfortunately the area on the left buttock almost looks like he has [name] skin tear from tape. He has open wound and then [name] large flap of skin that we are trying to get adherence over an area just next to the remaining wound 6/21; 2 week follow-up. I believe is been here for nurse visits. Miraculously the area between his first and second toes on the left dorsal foot is closed over. Still open on the right first second web space. The left lateral calf has 2 open areas. Distally this is more superficial. The proximal area had [name] little more depth and required debridement of adherent necrotic material. His buttock wound is actually larger we have been using silver alginate here 6/28; the  patient's area on the left foot remains closed. Still open wet area between the first and second toes on the right and also extending into the plantar aspect. We have been using silver alginate in this location. He has 2 areas on the left lower leg part of his original long wounds which I think are better. We have been using Hydrofera Blue here. Hydrofera Blue to the left buttock which is stable 7/12; left foot remains closed. Left ankle is closed. May be [name] small area between his right first and second toes the only truly open area is on the left buttock. We have been using Hydrofera Blue here 7/19; patient arrives with marked deterioration especially in the left foot and ankle. We did not put him in [name] compression wrap on the left last week in fact he wore his juxta lite stockings on either side although he does not have an underlying stocking. He has [name] reopening on the left dorsal foot, left lateral ankle and [name] new area on the right dorsal ankle. More worrisome is the degree of erythema on the left foot extending on the lateral foot into the lateral lower leg on the left 7/26; the patient had erythema and drainage from the lateral left ankle last week. Culture of this grew MRSA resistant to doxycycline and clindamycin which are the 2 antibiotics we usually use with this patient who has multiple antibiotic allergies including linezolid, trimethoprim sulfamethoxazole. I had give him an empiric doxycycline and he comes in the area certainly looks somewhat better although it is blotchy in his lower leg. He has not been systemically unwell. He has had areas on the left dorsal foot which is [name] reopening, chronic wounds on the left lateral ankle. Both of these I think are secondary to chronic venous insufficiency. The area between his first and second toes is closed as far as I can tell. He had [name] new wrap injury on the right dorsal ankle last week. Finally he has an area on the left buttock. We have been using  silver alginate to everything except the left buttock we are using Hydrofera Blue 06/30/20-Patient returns at 1 week, has been given [name] sample dose pack of NUZYRA which is [name] tetracycline derivative [omadacycline], patient has completed those, we have been using silver alginate to almost all the wounds except the left ischium where we are using Hydrofera Blue all of them look better 8/16; since I last saw the patient he has been doing well. The area on the left buttock, left lateral ankle and left foot are all closed today. He has completed the [place]  I gave him last time and tolerated this well. He still has open areas on the right dorsal ankle and in the right first second toe area which we are using silver alginate. 8/23; we put him in his bilateral external compression stockings last week as he did not have anything open on either leg except for concerning area between the right first and second toe. He comes in today with an area on the left dorsal foot slightly more proximal than the original wound, the left lateral foot but this is actually [name] continuation of the area he had on the left lateral ankle from last time. As well he is opened up on the left buttock again. 8/30; comes in today with things looking [name] lot better. The area on the left lower ankle has closed down as has the left foot but with eschar in both areas. The area on the dorsal right ankle is also epithelialized. Very little remaining of the left buttock wound. We have been using silver alginate on all wound areas 9/13; the area in the first second toe webspace on the right has fully epithelialized. He still has some vulnerable epithelium on the right and the ankle and the dorsal foot. He notes weeping. He is using his juxta lite stocking. On the left again the left dorsal foot is closed left lateral ankle is closed. We went to the juxta lite stocking here as well. ooStill vulnerable in the left buttock although only 2 small open areas  remain here 9/27; 2-week follow-up. We did not look at his left leg but the patient says everything is closed. He is [name] bit disturbed by the amount of edema in his left foot he is using juxta lite stockings but asking about over the toes stockings which would be 30/40, will talk to him next time. According to him there is no open wound on either the left foot or the left ankle/calf He has an open area on the dorsal right calf which I initially point [name] wrap injury. He has superficial remaining wound on the left ischial tuberosity been using silver alginate although he says this sticks to the wound 10/5; we gave him 2-week follow-up but he called yesterday expressing some concerns about his right foot right ankle and the left buttock. He came in early. There is still no open areas on the left leg and that still in his juxta lite stocking 10/11; he only has 1 small area on the left buttock that remains measuring millimeters 1 mm. Still has the same irritated skin in this area. We recommended zinc oxide when this eventually closes and pressure relief is meticulously is he can do this. He still has an area on the dorsal part of his right first through third toes which is [name] bit irritated and still open and on the dorsal ankle near the crease of the ankle. We have been using silver alginate and using his own stocking. He has nothing open on the left leg or foot 10/25; 2-week follow-up. Not nearly as good on the left buttock as I was hoping. For open areas with 5 looking threatened small. He has the erythematous irritated chronic skin in this area. oo1 area on the right dorsal ankle. He reports this area bleeds easily ooRight dorsal foot just proximal to the base of his toes ooWe have been using silver alginate. 11/8; 2-week follow-up. Left buttock is about the same although I do not think the wounds are in the same location we  have been using silver alginate. I have asked him to use zinc oxide on the  skin around the wounds. ooHe still has [name] small area on the right dorsal ankle he reports this bleeds easily ooRight dorsal foot just proximal to the base of the toes does not have anything open although the skin is very dry and scaly ooHe has [name] new opening on the nailbed of the left great toe. Nothing on the left ankle 11/29; 3-week follow-up. Left buttock has 2 open areas. And washing of these wounds today started bleeding easily. Suggesting very friable tissue. We have been using silver alginate. Right dorsal ankle which I thought was initially [name] wrap injury we have been using silver alginate. Nothing open between the toes that I can see. He states the area on the left dorsal toe nailbed healed after the last visit in 2 or 3 days 12/13; 3-week follow-up. His left buttock now has 3 open areas but the original 2 areas are smaller using polymen here. Surrounding skin looks better. The right dorsal ankle is closed. He has [name] small opening on the right dorsal foot at the level of the third toe. In general the skin looks better here. He is wearing his juxta lite stocking on the left leg says there is nothing open 11/24/2020; 3 [name] follow-up. His left buttock still has the 3 open areas. We have been using polymen but due to lack of response he changed to [facility] area. Surrounding skin is dry erythematous and irritated looking. There is no evidence of infection either bacterial or fungal however there is loss of surface epithelium ooHe still has very dry skin in his foot causing irritation and erythema on the dorsal part of his toes. This is not responded to prolonged courses of antifungal simply looks dry and irritated 1/24; left buttock area still looks about the same he was unable to find the triad ointment that we had suggested. The area on the right lower leg just above the dorsal ankle has reopened and the areas on the right foot between the first second and second third toes and scaling on  the bottom of the foot has been about the same for quite some time now. been using silver alginate to all wound areas 2/7; left buttock wound looked quite good although not much smaller in terms of surface area surrounding skin looks better. Only [name] few dry flaking areas on the right foot in between the first and second toes the skin generally looks better here [ammonium lactate]. Finally the area on the right dorsal ankle is closed 2/21; ooThere is no open area on the right foot even between the right first and second toe. Skin around this area dorsally and plantar aspects look better. ooHe has [name] reopening of the area on the right ankle just above the crease of the ankle dorsally. I continue to think that this is probably friction from spasms may be even this time with his stocking under the compression stockings. ooWounds on his left buttock look about the same there [name] couple of areas that have reopened. He has [name] total square area of loss of epithelialization. This does not look like infection it looks like [name] contact dermatitis but I just cannot determine to what 3/14; there is nothing on the right foot between the first and second toes this was carefully inspected under illumination. Some chronic irritation on the dorsal part of his foot from toes 1-3 at the base. Nothing really open here  substantially. Still has an area on the right foot/ankle that is actually larger and hyper granulated. His buttock area on the left is just about closed however he has chronic inflammation with loss of the surface epithelial layer 3/28; 2-week follow-up. In clinic today with [name] new wound on the left anterior mid tibia. Says this happened about 2 [name] ago. He is not really sure how wonders about the spasticity of his legs at night whether that could have caused this other than that he does not have [name] good idea. He has been using topical antibiotics and silver alginate. The area on his right dorsal ankle seems  somewhat better. ooFinally everything on his left buttock is closed. 4/11; 2-week follow-up. All of his wounds are better except for the area over the ischium and left buttock which have opened up widely again. At least part of Gaunce, [place] ([id number]) [id number] Page 30 of 37 this is covered in necrotic fibrinous material another part had rolled nonviable skin. The area on the right ankle, left anterior mid tibia are both [name] lot better. He had no open wounds on either foot including the areas between the first and second toes 4/25; patient presents for 2-week follow-up. He states that the wounds are overall stable. He has no complaints today and states he is using Hydrofera Blue to open wounds. 5/9; have not seen this man in over [name] month. For my memory he has open areas on the left mid tibia and right ankle. T oday he has new open area on the right dorsal foot which we have not had [name] problem with recently. He has the sustained area on the left buttock He is also changed his insurance at the beginning of the year [employer]. We will need prior authorizations for debridement 5/23; patient presents for 2-week follow-up. He has prior authorizations for debridement. He denies any issues in the past 2 [name] with his wound care. He has been using Hydrofera Blue to all the wounds. He does report [name] circular rash to the upper left leg that is new. He denies acute signs of infection. 6/6; 2-week follow-up. The patient has open wounds on the left buttock which are worse than the last time I saw this about [name] month ago. He also has [name] new area to me on the left anterior mid tibia with some surrounding erythema. The area on the dorsal ankle on the right is closed but I think this will be [name] friction injury every time this area is exposed to either our wraps or his compression stockings caused by unrelenting spasms in this leg. 6/20; 2-week follow-up. oo The patient has open wounds  on the left buttock which is about the same. Using [facility] here. - The left mid tibia has [name] static amount of surrounding erythema. Also [name] raised area in the center. We have been using Hydrofera Blue here. ooooFinally he has broken down in his dorsal right foot extending between the first and second toes and going to the base of the first and second toe webspace. I have previously assumed that this was severe venous hypertension 7/5; 2-week follow-up oo The left buttock wound actually looks better. We are using Hydrofera Blue. He has extensive skin irritation around this area and I have not really been able to get that any better. I have tried Lotrisone i.e. antifungals and steroids. More most recently we have just been using Coloplast really looks about the same. oo The left mid tibia which  was new last week culture to have very resistant staph aureus. Not only methicillin-resistant but doxycycline resistant. The patient has [name] plethora of antibiotic allergies including sulfa, linezolid. I used topical bacitracin on this but he has not started this yet. oo In addition he has an expanding area of erythema with [name] wound on the dorsal right foot. I did [name] deep tissue culture of this area today 7/12; oo Left buttock area actually looks better surrounding skin also looks less irritated. oo Left mid tibia looks about the same. He is using bacitracin this is not worse oo Right dorsal foot looks about the same as well. oo The left first toe also looks about the same 7/19; left buttock wound continues to improve in terms of open areas oo Left mid tibia is still concerning amount of swelling he is using bacitracin oo Dorsal left first toe somewhat smaller oo Right dorsal foot somewhat smaller 7/25; left buttock wound actually continues to improve oo Left mid tibia area has less swelling. I gave him all my samples of new Nuzyra. This seems to have helped although the wound is still open it.  His abrasion closed by here oo Left dorsal great toe really no better. Still [name] very nonviable surface oo Right dorsal foot perhaps some better. oo We have been using bacitracin and silver nitrate to the areas on his lower legs and Hydrofera Blue to the area on the buttock. 8/16 oo Disappointed that his left buttock wound is actually more substantial. Apparently during the last nurse visit these were both very small. He has continued irritation to [name] large area of skin on his buttock. I have never been able to totally explain this although I think it some combination of the way he sits, pressure, moisture. He is not incontinent enough to contribute to this. oo Left dorsal great toe still fibrinous debris on the surface that I have debrided today oo Large area across the dorsal right toes. oo The area on the left anterior mid tibia has less swelling. He completed the [place]. This does not look infected although the tissue is still fried 8/30; 2-week follow-up. oo Left buttock areas not improved. We used Hydrofera Blue on this. Weeping wet with the surrounding erythema that I have not been able to control even with Lotrisone and topical Coloplast oo Left dorsal great toe looks about the same oo More substantial area again at the base of his toes on the left which is new this week. oo Area across the dorsal right toes looks improved oo The left anterior mid tibia looks like it is trying to close 9/13; 2-week follow-up. Using silver alginate on all of his wounds. The left dorsal foot does not look any better. He has the area on the dorsal toe and also the areas at the base of all of his toes 1 through 3. On the right foot he has [name] similar pattern in [name] similar area. He has the area on his left mid tibia that looks fairly healthy. Finally the area on his left buttock looks somewhat bette 9/20; culture I did of the left foot which was [name] deep tissue culture last time showed E. coli he has  erythema around this wound. Still [name] completely necrotic surface. His right dorsal foot looks about the same. He has [name] very friable surface to the left anterior mid tibia. Both buttock wounds look better. We have been using silver alginate to all wounds 10/4; he has completed the cephalexin that  I gave him last time for the left foot. He is using topical gentamicin under silver alginate silver alginate being applied to all the wounds. Unfortunately all the wounds look irritated on his dorsal right foot dorsal left foot left mid tibia. I wonder if this could be [name] silver allergy. I am going to change him to [facility] on the lower extremity. The skin on the left buttock and left posterior thigh still flaking dry and irritated. This has continued no matter what I have applied topically to this. He has [name] solitary open wound which by itself does not look too bad however the entire area of surrounding skin does not change no matter what we have applied here 10/18; the area on the left dorsal foot and right dorsal foot both look better. The area on the right extends into the plantar but not between his toes. We have been using silver alginate. He still has [name] rectangular erythematous area around the area on the left tibia. The wound itself is very small. Finally everything on his left buttock looks [name] little larger the skin is erythematous 11/15; patient comes in with the left dorsal and right dorsal foot distally looking somewhat better. Still nonviable surface on the left foot which required debridement. He still has the area on the left anterior mid tibia although this looks somewhat better. He has [name] new area on the right lateral lower leg just above the ankle. Finally his left buttock looks terrible with multiple superficial open wounds geometric square shaped area of chronic erythema which I have not been able to sort through 11/29; right dorsal foot and left dorsal foot both look somewhat better. No  debridement required. He has the fragile area on the left anterior mid tibia this looks and continues to look somewhat better. Right lateral lower leg just above the ankle we identified last time also looks better. In general the area on his left buttock looks improved. We are using Hydrofera Blue to all wound areas 12/13; right dorsal foot looks better. The area on the right lateral leg is healed. Left dorsal foot has 2 open areas both of which require debridement. The fragile area on the left anterior mid tibial looks better. Smaller area on his buttocks. Were using Hydrofera Blue 1/10; patient comes in with everything looking slightly larger and/or worse. This includes his left buttock, reopening of the left mid tibia, larger areas on the left dorsal foot and what looks to be [name] cellulitis on the right dorsal and plantar foot. We have been using Hydrofera Blue on all wounds. 1/17; right dorsal foot distally looks better today. The left foot has 2 open wounds that are about the same surrounding erythema. 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 No malignancy they did not stain for fungus 1/24; his right distal foot is not closed dry and scaly but the wound looks like it is contracting. I did not debride anything here. Problem on his left dorsal foot with expanding erythema. Apparently there were problems last week getting the Elesa Hacker however it is now available at the [facility] but [name] week later. Harty, TRAVEZ STANCIL ([id number]) [id number] Page 31 of 37 He is using ketoconazole and Coloplast to the left buttock along with Hydrofera Blue this actually looks quite [name] bit better today. 1/31; right dorsal foot again is dry and scaly but looking to contract. He  has been using [name] moisturizer on his feet at my request but he is not sure which  1. The left dorsal foot wounds look about the same there is erythema here that I marked last week however after course of Nuzyra it certainly is not any better but not any worse either. Finally on his left buttock the skin continues to look better he has the original wound but [name] new substantial area towards the gluteal cleft. Almost like [name] skin tear. I used scissors to remove skin and subcutaneous tissue here silver nitrate and direct pressure 2/7; right dorsal foot. This does not look too much different from last week. Some erythema skin dry and scaly. No debridement. Left dorsal great toe again still not much improvement. I did remove flaking dry skin and callus from around the edge. Finally on his left buttock. The skin is somewhat better in the periwound. Surface wounds are superficial somewhat better than last week. 01/26/2022: Is [name] little bit of [name] mystery as to why his wounds fail to respond to treatments and actually seem to get worse. This is my first encounter with this patient. He was previously followed by Dr. Dellia Nims. Based upon my review of the chart, it seems that there is [name] little bit of [name] mystery as to why his wounds do not respond as anticipated to the interventions applied and sometimes even get worse. Biopsies have been performed and he was seen by dermatology in [place], but that did not shed any light on the matter. T oday, his gluteal wound is larger, with substantial drainage, rather malodorous. The food wounds are not terrible, but he has [name] lot of callus and scaly skin around these. He is currently getting silver alginate on the gluteal wound, with idodoflex to the feet. He is using lotrisone on his legs for the dry, scaly skin. 02/09/2022: There has really been no change to any of his wounds. The gluteal wound less drainage and odor, but remains about the same size, the periwound skin remains oddly scaly. His lower extremity wounds also appear roughly the same size. They continue  to accumulate [name] small amount of slough. The periwound on his feet and ankle wounds has dry eschar and loose dead skin. We have been using silver alginate on the gluteal wound and Iodoflex on his feet and ankle wounds. T the periwound around his gluteal lesion and Lotrisone on his feet and legs. o 02/23/2022: The right plantar foot wound is closed. The gluteal site looks small but has continued to produce hypertrophic granulation tissue. The foot wounds all look about the same on the dorsal surface of the right foot; on the left, there is only [name] small open area at the site of where his left great toenail would have been. 03/16/2022: The right ankle wound is healed. The right dorsal foot wound is about the same. The left dorsal foot wound is quite [name] bit smaller and the ischial wound is nearly closed. 03/30/2022: The right ankle wound reopened. Both dorsal foot wounds are quite [name] bit smaller. Unfortunately, he appears to have sheared part of his ischial wound open further, perhaps during [name] transfer. 04/13/2022: The right ankle wound has hypertrophic granulation tissue present. The dorsal foot wounds continue to decrease in size. The ischial wound looks about the same today, no better, no worse. 04/27/2022: The right ankle wound has closed. Unfortunately, it looks like some moisture got underneath the dry skin on both of his dorsal feet and these wounds have  expanded in size. The ischial wound remains the same with perhaps [name] little bit more slough accumulation than at our previous visit. 05/11/2022: The right ankle wound remains closed. There is [name] left anterior tibial wound that is small has patchy openings with accumulated slough. The dorsum of his right foot appears to be nearly healed with just [name] small punctate opening. The plantar surface of his right foot has [name] new opening that looks like he may have picked some skin there. His sacral ulcer has hypertrophic granulation tissue but has some slough  accumulation. The dorsum of his left foot has multiple open areas in [name] fairly ragged distribution. All of these have slough accumulated within them. 06/01/2022: The right ankle and left anterior tibial wound are both closed. Dorsum of his right foot and left foot both look substantially better with just tiny scattered openings The without any slough accumulation. He has sheared open new areas on his left gluteus and ischium. He says that his wheelchair cushion, which is air-filled, has [name] leak and so it keeps deflating. He is awaiting [name] new cushion. 06/15/2022: The right ankle wound has reopened and the fat layer is exposed. Both dorsal feet have just small openings with just [name] little bit of slough and eschar accumulation. The wound on his left gluteus and ischium is larger again today and has [name] foul odor. 06/29/2022: The right ankle wound has hypertrophic granulation tissue buildup. His dorsal foot wounds both look better with just some eschar on the surface. He has [name] new wound on his left lateral ankle. He is not sure how he acquired it but by appearance, it looks that he hit it on something, potentially his wheelchair or bed. The ischial wound is about the same but is cleaner without any significant purulent drainage or odor. He did not understand what the [facility] call was about and therefore he does not have the topical compounded antibiotic. 07/13/2022: The right ankle wound again has hypertrophic granulation tissue, but less so than at his previous visit. The ischial wound has improved tremendously the use of the [facility] topical antibiotic. No significant change to the left lateral ankle wound; it is fibrotic with slough present. The skin of both of his feet, especially on the right, has [name] yeasty appearance. 08/10/2022: There is again hypertrophic granulation tissue on the right anterior ankle wound. Both feet are about the same. The left lateral ankle wound is [name] little bit desiccated and has some  slough buildup. He unfortunately suffered [name] new injury when he was removing his pants and they caught his bandage which caused [name] large skin tear on his left ischium, just distal to the existing wound. The existing left ischial wound, however, is significantly better with just [name] little light slough on the surface. 08/31/2022: The right anterior ankle wound is [name] lot smaller today underneath some eschar. No accumulation of hypertrophic granulation tissue. The left lateral ankle wound has some slough on the surface but is better in terms of moisture balance this week. The left dorsal foot does not really have any openings on it today. The right dorsal foot has some slough and eschar accumulation. His gluteal ulcer is basically closed aside from 2 small areas that are oozing [name] bit. 09/21/2022: The right anterior ankle wound is down to just [name] tiny pinhole. The left lateral ankle wound has accumulated slough again, but moisture balance is good. Left and right dorsal feet both have 2 small openings with some slough in them. The  gluteal ulcer, unfortunately has opened up substantially. Patient History Information obtained from Patient. Family History Diabetes - Father, Hypertension - Mother, No family history of Cancer, Heart Disease, Hereditary Spherocytosis, Kidney Disease, Lung Disease, Stroke, Thyroid Problems, Tuberculosis. Social History Former smoker, Marital Status - Married, Alcohol Use - Moderate, Drug Use - No History, Caffeine Use - Daily. Medical History Ear/Nose/Mouth/Throat Denies history of Chronic sinus problems/congestion, Middle ear problems Hematologic/Lymphatic Denies history of Anemia, Hemophilia, Human Immunodeficiency Virus, Lymphedema, Sickle Cell Disease Respiratory Patient has history of Sleep Apnea - does not tolerate cpap Comes, Fenris E ([id number]) [id number] Page 32 of 37 Denies history of Aspiration, Asthma, Chronic Obstructive Pulmonary  Disease (COPD), Pneumothorax, Tuberculosis Cardiovascular Patient has history of Hypertension - lisinopril HCTZ Denies history of Angina, Arrhythmia, Congestive Heart Failure, Coronary Artery Disease, Deep Vein Thrombosis, Hypotension, Myocardial Infarction, Peripheral Arterial Disease, Peripheral Venous Disease, Phlebitis, Vasculitis Gastrointestinal Denies history of Cirrhosis , Colitis, Crohnoos, Hepatitis [name], Hepatitis B, Hepatitis C Endocrine Denies history of Type I Diabetes, Type II Diabetes Genitourinary Denies history of End Stage Renal Disease Immunological Denies history of Lupus Erythematosus, Raynaudoos, Scleroderma Integumentary (Skin) Denies history of History of Burn Musculoskeletal Denies history of Gout, Rheumatoid Arthritis, Osteoarthritis, Osteomyelitis Neurologic Patient has history of Paraplegia Denies history of Dementia, Neuropathy, Quadriplegia, Seizure Disorder Oncologic Denies history of Received Chemotherapy, Received Radiation Psychiatric Denies history of Anorexia/bulimia, Confinement Anxiety Hospitalization/Surgery History - cellulitis in leg. - left leg vein ablation. Objective Constitutional Slightly tachycardic, asymptomatic. No acute distress.. Vitals Time Taken: 8:05 AM, Height: 70 in, Weight: 216 lbs, BMI: 31, Temperature: 98.4 F, Pulse: 105 bpm, Respiratory Rate: 20 breaths/min, Blood Pressure: 137/77 mmHg. Respiratory Normal work of breathing on room air.. General Notes: 09/21/2022: The right anterior ankle wound is down to just [name] tiny pinhole. The left lateral ankle wound has accumulated slough again, but moisture balance is good. Left and right dorsal feet both have 2 small openings with some slough in them. The gluteal ulcer, unfortunately has opened up substantially. Integumentary (Hair, Skin) Wound #52 status is Open. Original cause of wound was Gradually Appeared. The date acquired was: 03/27/2021. The wound has been in treatment 77  [name]. The wound is located on the Right,Dorsal Foot. The wound measures 0.3cm length x 0.3cm width x 0.1cm depth; 0.071cm^2 area and 0.007cm^3 volume. There is Fat Layer (Subcutaneous Tissue) exposed. There is no tunneling or undermining noted. There is [name] medium amount of serosanguineous drainage noted. The wound margin is flat and intact. There is large (67-100%) red granulation within the wound bed. There is no necrotic tissue within the wound bed. The periwound skin appearance had no abnormalities noted for color. The periwound skin appearance exhibited: Dry/Scaly. The periwound skin appearance did not exhibit: Callus, Crepitus, Excoriation, Induration, Rash, Scarring, Maceration. Periwound temperature was noted as No Abnormality. Wound #52 status is Open. Original cause of wound was Gradually Appeared. The date acquired was: 03/27/2021. The wound has been in treatment 77 [name]. The wound is located on the Right,Dorsal Foot. The wound measures 0.3cm length x 0.3cm width x 0.1cm depth; 0.071cm^2 area and 0.007cm^3 volume. There is [name] none present amount of drainage noted. Wound #56 status is Open. Original cause of wound was Gradually Appeared. The date acquired was: 07/11/2021. The wound has been in treatment 61 [name]. The wound is located on the Left,Dorsal Foot. The wound measures 0.5cm length x 0.1cm width x 0.1cm depth; 0.039cm^2 area and 0.004cm^3 volume. There is Fat Layer (Subcutaneous Tissue) exposed. There is no  tunneling or undermining noted. There is [name] small amount of serosanguineous drainage noted. The wound margin is flat and intact. There is large (67-100%) red, pink granulation within the wound bed. There is no necrotic tissue within the wound bed. The periwound skin appearance had no abnormalities noted for texture. The periwound skin appearance exhibited: Dry/Scaly, Erythema. The periwound skin appearance did not exhibit: Maceration. The surrounding wound skin color is noted with  erythema. Periwound temperature was noted as Hot. Wound #65 status is Open. Original cause of wound was Pressure Injury. The date acquired was: 05/24/2022. The wound has been in treatment 16 [name]. The wound is located on the Left Upper Leg. The wound measures 6.5cm length x 3.5cm width x 0.1cm depth; 17.868cm^2 area and 1.787cm^3 volume. There is Fat Layer (Subcutaneous Tissue) exposed. There is no tunneling or undermining noted. There is [name] large amount of serosanguineous drainage noted. Foul odor after cleansing was noted. The wound margin is flat and intact. There is large (67-100%) red, friable granulation within the wound bed. There is no necrotic tissue within the wound bed. The periwound skin appearance had no abnormalities noted for color. The periwound skin appearance exhibited: Scarring, Dry/Scaly. The periwound skin appearance did not exhibit: Maceration. Periwound temperature was noted as No Abnormality. Wound #66 status is Open. Original cause of wound was Pressure Injury. The date acquired was: 06/15/2022. The wound has been in treatment 14 [name]. The wound is located on the Right,Anterior Ankle. The wound measures 0.1cm length x 0.1cm width x 0.1cm depth; 0.008cm^2 area and 0.001cm^3 volume. There is Fat Layer (Subcutaneous Tissue) exposed. There is no tunneling or undermining noted. There is [name] none present amount of drainage noted. The wound margin is flat and intact. There is no granulation within the wound bed. There is no necrotic tissue within the wound bed. The periwound skin appearance had no abnormalities noted for color. The periwound skin appearance exhibited: Scarring, Dry/Scaly. The periwound skin appearance did not exhibit: Maceration. Periwound temperature was noted as No Abnormality. Wound #66 status is Open. Original cause of wound was Pressure Injury. The date acquired was: 06/15/2022. The wound has been in treatment 14 [name]. The wound is located on the Right,Anterior  Ankle. The wound measures 0.1cm length x 0.1cm width x 0.1cm depth; 0.008cm^2 area and 0.001cm^3 volume. There is [name] medium amount of serosanguineous drainage noted. Cumbo, LEOR WHYTE ([id number]) [id number] Page 33 of 37 Wound #67 status is Open. Original cause of wound was Gradually Appeared. The date acquired was: 06/22/2022. The wound has been in treatment 12 [name]. The wound is located on the Left,Lateral Lower Leg. The wound measures 4.3cm length x 1.7cm width x 0.1cm depth; 5.741cm^2 area and 0.574cm^3 volume. There is Fat Layer (Subcutaneous Tissue) exposed. There is no tunneling or undermining noted. There is [name] medium amount of serosanguineous drainage noted. The wound margin is distinct with the outline attached to the wound base. There is medium (34-66%) red granulation within the wound bed. There is [name] medium (34-66%) amount of necrotic tissue within the wound bed including Adherent Slough. The periwound skin appearance had no abnormalities noted for texture. The periwound skin appearance exhibited: Dry/Scaly, Erythema. The surrounding wound skin color is noted with erythema. Periwound temperature was noted as Hot. Wound #67 status is Open. Original cause of wound was Gradually Appeared. The date acquired was: 06/22/2022. The wound has been in treatment 12 [name]. The wound is located on the Left,Lateral Lower Leg. The wound measures 4.3cm length x 1.7cm  width x 0.1cm depth; 5.741cm^2 area and 0.574cm^3 volume. There is [name] none present amount of drainage noted. 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 There was [name] Selective/Open Wound Non-Viable Tissue Debridement with [name] total area of 0.09 sq cm performed by Fredirick Maudlin, MD. With the following instrument(s): Curette to remove Non-Viable tissue/material. Material removed includes [facility]. No specimens were taken. [name] time out was conducted at 08:35, prior to the start of the procedure. [name] Minimum amount of bleeding was controlled with Pressure. The procedure was tolerated well with [name] pain level of Insensate throughout and [name] pain level of Insensate following the procedure. Post Debridement Measurements: 0.3cm length x 0.3cm width x 0.1cm depth; 0.007cm^3 volume. Character of Wound/Ulcer Post Debridement is improved. Post procedure Diagnosis Wound #52: Same as Pre-Procedure General Notes: Scribed for Dr. Celine [name] by Baruch Gouty, RN. 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 There was [name] Selective/Open Wound Non-Viable Tissue Debridement with [name] total area of 0.05 sq cm performed by Fredirick Maudlin, MD. With the following instrument(s): Curette to remove Non-Viable tissue/material. Material removed includes [facility]. No specimens were taken. [name] time out was conducted at 08:35, prior to the start of the procedure. [name] Minimum amount of bleeding was controlled with Pressure. The procedure was tolerated well with [name] pain level of Insensate throughout and [name] pain level of Insensate following the procedure. Post Debridement Measurements: 0.5cm length x 0.1cm width x 0.1cm depth; 0.004cm^3 volume. Character of Wound/Ulcer Post Debridement is improved. Post procedure Diagnosis Wound #56: Same as Pre-Procedure General  Notes: Scribed for Dr. Celine [name] by Baruch Gouty, RN. Wound #65 Pre-procedure diagnosis of Wound #65 is [name] Pressure Ulcer located on the Left Upper Leg . There was [name] Selective/Open Wound Non-Viable Tissue Debridement with [name] total area of 22.75 sq cm performed by Fredirick Maudlin, MD. With the following instrument(s): Curette to remove Non-Viable tissue/material. Material removed includes [employer]. No specimens were taken. [name] time out was conducted at 08:35, prior to the start of the procedure. [name] Minimum amount of bleeding was controlled with Silver Nitrate. The procedure was tolerated well with [name] pain level of Insensate throughout and [name] pain level of Insensate following the procedure. Post Debridement Measurements: 6.5cm length x 3.5cm width x 0.1cm depth; 1.787cm^3 volume. Post debridement Stage noted as Category/Stage III. Character of Wound/Ulcer Post Debridement is improved. Post procedure Diagnosis Wound #65: Same as Pre-Procedure General Notes: Scribed for Dr. Celine [name] by Baruch Gouty, RN. 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 There was [name] Excisional Skin/Subcutaneous Tissue Debridement with [name] total area of 7.31 sq cm performed by Fredirick Maudlin, MD. With the following instrument(s): Curette to remove Viable and Non-Viable tissue/material. Material removed includes Subcutaneous Tissue and Slough and. 1 specimen was taken by [name]  Tissue Culture and sent to the lab per facility protocol. [name] time out was conducted at 08:35, prior to the start of the procedure. [name] Minimum amount of bleeding was controlled with Pressure. The procedure was tolerated well with [name] pain level of Insensate throughout and [name] pain level of Insensate following the procedure. Post Debridement Measurements: 4.3cm length x 1.7cm width x 0.1cm depth; 0.574cm^3 volume. Character of Wound/Ulcer Post Debridement  is improved. Severity of Tissue Post Debridement is: Fat layer exposed. Post procedure Diagnosis Wound #67: Same as Pre-Procedure General Notes: Scribed for Dr. Celine [name] by Baruch Gouty, RN. Plan Follow-up Appointments: Return Appointment in 2 [name]. - Dr. Celine [name] RM 1 Bathing/ Shower/ Hygiene: May shower and wash wound with soap and water. - on days that dressing is changed Madara, Dong E ([id number]) [id number] Page 34 of 37 Edema Control - Lymphedema / SCD / Other: Elevate legs to the level of the heart or above for 30 minutes daily and/or when sitting, [name] frequency of: - throughout the day Moisturize legs daily. - using Aquaphor generously to both legs and feet with dressing changes Compression stocking or Garment 30-40 mm/Hg pressure to: - Juxtalite to both legs daily Off-Loading: Roho cushion for wheelchair Turn and reposition every 2 hours Laboratory ordered were: Anaerobic culture The following medication(s) was prescribed: doxycycline hyclate oral 150 mg tablet 1 tab p.o. twice daily x10 days; separate from multivitamins and zinc supplement by 4 hours starting 09/21/2022 WOUND #52: - Foot Wound Laterality: Dorsal, Right Cleanser: Soap and Water Every Other Day/30 Days Discharge Instructions: May shower and wash wound with dial antibacterial soap and water prior to dressing change. Peri-Wound Care: Sween Lotion (Moisturizing lotion) Every Other Day/30 Days Discharge Instructions: Apply Aquaphor moisturizing lotion as directed Peri-Wound Care: Lotrisone Every Other Day/30 Days Discharge Instructions: or antifungal spray, Apply Lotrisone to periwound Prim Dressing: KerraCel Ag Gelling Fiber Dressing, 2x2 in (silver alginate) Every Other Day/30 Days ary Discharge Instructions: Apply silver alginate to wound bed as instructed Secondary Dressing: Woven Gauze Sponge, Non-Sterile 4x4 in Every Other Day/30 Days Discharge Instructions: Apply over primary  dressing as directed. Secured With: [employer], 4.5x3.1 (in/yd) (Generic) Every Other Day/30 Days Discharge Instructions: Secure with Kerlix as directed. Secured With: 37M Medipore H Soft Cloth Surgical T ape, 4 x 10 (in/yd) (Generic) Every Other Day/30 Days Discharge Instructions: Secure with tape as directed. Com pression Stockings: Circaid Juxta Lite Compression Wrap Compression Amount: 30-40 mmHg (right) Discharge Instructions: Apply Circaid Juxta Lite Compression Wrap daily as instructed. Apply first thing in the morning, remove at night before bed. WOUND #56: - Foot Wound Laterality: Dorsal, Left Cleanser: Soap and Water Every Other Day/30 Days Discharge Instructions: May shower and wash wound with dial antibacterial soap and water prior to dressing change. Peri-Wound Care: Sween Lotion (Moisturizing lotion) Every Other Day/30 Days Discharge Instructions: Apply Aquaphor moisturizing lotion as directed Peri-Wound Care: Lotrisone Every Other Day/30 Days Discharge Instructions: or antifungal spray, Apply Lotrisone to periwound Prim Dressing: KerraCel Ag Gelling Fiber Dressing, 2x2 in (silver alginate) Every Other Day/30 Days ary Discharge Instructions: Apply silver alginate to wound bed as instructed Secondary Dressing: Woven Gauze Sponge, Non-Sterile 4x4 in Every Other Day/30 Days Discharge Instructions: Apply over primary dressing as directed. Secured With: [employer], 4.5x3.1 (in/yd) (Generic) Every Other Day/30 Days Discharge Instructions: Secure with Kerlix as directed. Secured With: 37M Medipore H Soft Cloth Surgical T ape, 4 x 10 (in/yd) (Generic) Every Other Day/30 Days Discharge Instructions: Secure with tape as  directed. WOUND #65: - Upper Leg Wound Laterality: Left Cleanser: Soap and Water Every Other Day/30 Days Discharge Instructions: May shower and wash wound with dial antibacterial soap and water prior to dressing change. Peri-Wound Care: Zinc Oxide  Ointment 30g tube Every Other Day/30 Days Discharge Instructions: Apply Zinc Oxide to periwound with each dressing change Topical: Keystone antibiotic compound Every Other Day/30 Days Discharge Instructions: thin layer to wound bed, use daily once available Prim Dressing: Hydrofera Blue Classic Foam, 4x4 in Every Other Day/30 Days ary Discharge Instructions: Moisten with saline prior to applying to wound bed Secondary Dressing: Zetuvit Plus Silicone Border Dressing 5x5 (in/in) Every Other Day/30 Days Discharge Instructions: Apply silicone border over primary dressing as directed. WOUND #66: - Ankle Wound Laterality: Right, Anterior Cleanser: Soap and Water Every Other Day/30 Days Discharge Instructions: May shower and wash wound with dial antibacterial soap and water prior to dressing change. Peri-Wound Care: Sween Lotion (Moisturizing lotion) Every Other Day/30 Days Discharge Instructions: Apply Aquaphor moisturizing lotion as directed Peri-Wound Care: Lotrisone Every Other Day/30 Days Discharge Instructions: Apply Lotrisone to periwound Prim Dressing: KerraCel Ag Gelling Fiber Dressing, 2x2 in (silver alginate) Every Other Day/30 Days ary Discharge Instructions: Apply silver alginate to wound bed as instructed Secondary Dressing: Woven Gauze Sponge, Non-Sterile 4x4 in Every Other Day/30 Days Discharge Instructions: Apply over primary dressing as directed. Secured With: [employer], 4.5x3.1 (in/yd) (Generic) Every Other Day/30 Days Discharge Instructions: Secure with Kerlix as directed. Secured With: 32M Medipore H Soft Cloth Surgical T ape, 4 x 10 (in/yd) (Generic) Every Other Day/30 Days Discharge Instructions: Secure with tape as directed. WOUND #67: - Lower Leg Wound Laterality: Left, Lateral Cleanser: Soap and Water Every Other Day/30 Days Discharge Instructions: May shower and wash wound with dial antibacterial soap and water prior to dressing change. Peri-Wound Care:  Triamcinolone 15 (g) Every Other Day/30 Days Discharge Instructions: Use triamcinolone 15 (g) as directed Peri-Wound Care: Sween Lotion (Moisturizing lotion) Every Other Day/30 Days Discharge Instructions: Apply Aquaphor moisturizing lotion as directed Peri-Wound Care: Lotrisone Every Other Day/30 Days Discharge Instructions: Apply Lotrisone to periwound Prim Dressing: KerraCel Ag Gelling Fiber Dressing, 2x2 in (silver alginate) Every Other Day/30 Days ary Discharge Instructions: Apply silver alginate to wound bed as instructed Secondary Dressing: Woven Gauze Sponge, Non-Sterile 4x4 in Every Other Day/30 Days Discharge Instructions: Apply over primary dressing as directed. Secured With: [employer], 4.5x3.1 (in/yd) (Generic) Every Other Day/30 Days Discharge Instructions: Secure with Kerlix as directed. Secured With: 32M Medipore H Soft Cloth Surgical T ape, 4 x 10 (in/yd) (Generic) Every Other Day/30 Days Discharge Instructions: Secure with tape as directed. Terral, ALMANDO BRAWLEY ([id number]) [id number] Page 35 of 37 09/21/2022: The right anterior ankle wound is down to just [name] tiny pinhole. The left lateral ankle wound has accumulated slough again, but moisture balance is good. Left and right dorsal feet both have 2 small openings with some slough in them. The gluteal ulcer, unfortunately has opened up substantially. I used [name] curette to debride slough from both of the dorsal foot wounds as well as the left lateral ankle wound. He has quite [name] bit of erythema running along the left side of his foot and up to about the lower calf. I am concerned for infection. 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 Once culture data return, I will tailor as appropriate. I debrided biofilm off the gluteal wound. Due  to its friability, it required topical silver nitrate application to control  bleeding. We will continue to use his Keystone topical antimicrobial on his gluteus, but I am going to change the dressing to [facility]. Continue silver alginate to the other sites. Follow-up here in 2 [name]. Electronic Signature(s) Signed: 09/21/2022 9:07:36 AM By: Fredirick Maudlin MD FACS Entered By: Fredirick Maudlin on 09/21/2022 09:07:36 -------------------------------------------------------------------------------- HxROS Details Patient Name: Date of Service: Romig, [name] 09/21/2022 8:15 [name] Medical Record Number: [id number] Patient Account Number: [id number] Date of Birth/Sex: Treating RN: [birth year] (36 y.o. M) Primary Care Provider: [place], [place] Other Clinician: Referring Provider: Treating Provider/Extender: [name] in Treatment: 350 Information Obtained From Patient Ear/Nose/Mouth/Throat Medical History: Negative for: Chronic sinus problems/congestion; Middle ear problems Hematologic/Lymphatic Medical History: Negative for: Anemia; Hemophilia; Human Immunodeficiency Virus; Lymphedema; Sickle Cell Disease Respiratory Medical History: Positive for: Sleep Apnea - does not tolerate cpap Negative for: Aspiration; Asthma; Chronic Obstructive Pulmonary Disease (COPD); Pneumothorax; Tuberculosis Cardiovascular Medical History: Positive for: Hypertension - lisinopril HCTZ Negative for: Angina; Arrhythmia; Congestive Heart Failure; Coronary Artery Disease; Deep Vein Thrombosis; Hypotension; Myocardial Infarction; Peripheral Arterial Disease; Peripheral Venous Disease; Phlebitis; Vasculitis Gastrointestinal Medical History: Negative for: Cirrhosis ; Colitis; Crohns; Hepatitis [name]; Hepatitis B; Hepatitis C Endocrine Medical History: Negative for: Type I Diabetes; Type II Diabetes Genitourinary Medical History: Negative for: End Stage Renal Disease Immunological Medical History: Negative for: Lupus Erythematosus; Raynauds;  Scleroderma Stitt, Campbell E ([id number]) [id number] Page 36 of 37 Integumentary (Skin) Medical History: Negative for: History of Burn Musculoskeletal Medical History: Negative for: Gout; Rheumatoid Arthritis; Osteoarthritis; Osteomyelitis Neurologic Medical History: Positive for: Paraplegia Negative for: Dementia; Neuropathy; Quadriplegia; Seizure Disorder Oncologic Medical History: Negative for: Received Chemotherapy; Received Radiation Psychiatric Medical History: Negative for: Anorexia/bulimia; Confinement Anxiety Immunizations Pneumococcal Vaccine: Received Pneumococcal Vaccination: No Immunization Notes: doesn't remember when last tetanus shot was Implantable Devices No devices added Hospitalization / Surgery History Type of Hospitalization/Surgery cellulitis in leg left leg vein ablation Family and Social History Cancer: No; Diabetes: Yes - Father; Heart Disease: No; Hereditary Spherocytosis: No; Hypertension: Yes - Mother; Kidney Disease: No; Lung Disease: No; Stroke: No; Thyroid Problems: No; Tuberculosis: No; Former smoker; Marital Status - Married; Alcohol Use: Moderate; Drug Use: No History; Caffeine Use: Daily; Financial Concerns: No; Food, Clothing or Shelter Needs: No; Support System Lacking: No; Transportation Concerns: No Electronic Signature(s) Signed: 09/21/2022 11:15:04 AM By: Fredirick Maudlin MD FACS Entered By: Fredirick Maudlin on 09/21/2022 09:03:24 -------------------------------------------------------------------------------- SuperBill Details Patient Name: Date of Service: Uriegas, [name] 09/21/2022 Medical Record Number: [id number] Patient Account Number: [id number] Date of Birth/Sex: Treating RN: [birth year] (36 y.o. 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, stage 3 Manes, Claudius E ([id number]) [id number] Page 37 of 37 L97.518 Non-pressure chronic ulcer of other part of right foot with other 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, 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 Non-pressure chronic ulcer of other part of left foot limited to breakdown 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 Facility Procedures : CPT4 Code: 95188416 Description: 11042 - DEB SUBQ TISSUE 20 SQ CM/< ICD-10 Diagnosis Description L97.322 Non-pressure chronic  ulcer of left 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 Modifier: Quantity: 1 : CPT4 Code: 98338250 Description: 53976 - DEBRIDE WOUND 1ST 20 SQ CM OR < ICD-10 Diagnosis Description L97.521 Non-pressure chronic ulcer of other part of left 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 L97.511 Non-pressure chronic ulcer of other part of right foot limited to  breakdown of ski 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, stage 3 Modifier: n Quantity: 1 : CPT4 Code: [id number] Description: 02409 - DEBRIDE WOUND EA ADDL 20 SQ CM ICD-10 Diagnosis Description L97.521 Non-pressure chronic ulcer of other part of left foot limited to breakdown of skin L97.511 Non-pressure chronic ulcer of other part of right foot limited to  breakdown of ski 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, stage 3 Modifier: n Quantity: 1 Physician Procedures : CPT4 Code Description Modifier [id number] 24268 - WC PHYS LEVEL 4 - EST PT 25 ICD-10 Diagnosis Description 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, 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 L97.521  Non-pressure chronic  ulcer of other part of left foot limited to breakdown of skin L97.322 Non-pressure chronic ulcer of left 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 Quantity: 1 : [id number] 11042 - WC PHYS SUBQ TISS 20 SQ CM ICD-10 Diagnosis Description L97.322 Non-pressure chronic ulcer of left 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 Quantity: 1 : [id number] 11941 - WC PHYS DEBR WO ANESTH 20 SQ CM ICD-10 Diagnosis Description L97.521 Non-pressure chronic ulcer of other part of left foot limited to breakdown of skin L97.511 Non-pressure chronic ulcer of other part of right foot limited to breakdown  of 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

## 2022-10-05 ENCOUNTER — Encounter (HOSPITAL_BASED_OUTPATIENT_CLINIC_OR_DEPARTMENT_OTHER): Payer: BC Managed Care – PPO | Attending: General Surgery | Admitting: General Surgery

## 2022-10-05 DIAGNOSIS — I10 Essential (primary) hypertension: Secondary | ICD-10-CM | POA: Insufficient documentation

## 2022-10-05 DIAGNOSIS — L97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: Secondary | ICD-10-CM | POA: Diagnosis not present

## 2022-10-05 DIAGNOSIS — I890 Lymphedema, not elsewhere classified: Secondary | ICD-10-CM | POA: Diagnosis not present

## 2022-10-05 DIAGNOSIS — L89322 Pressure ulcer of left buttock, stage 2: Secondary | ICD-10-CM | POA: Insufficient documentation

## 2022-10-05 DIAGNOSIS — L97521 Non-pressure chronic ulcer of other part of left foot limited to breakdown of skin: Secondary | ICD-10-CM | POA: Insufficient documentation

## 2022-10-05 DIAGNOSIS — L89523 Pressure ulcer of left ankle, stage 3: Secondary | ICD-10-CM | POA: Diagnosis not present

## 2022-10-05 DIAGNOSIS — L97909 Non-pressure chronic ulcer of unspecified part of unspecified lower leg with unspecified severity: Secondary | ICD-10-CM | POA: Diagnosis not present

## 2022-10-05 DIAGNOSIS — L97311 Non-pressure chronic ulcer of right ankle limited to breakdown of skin: Secondary | ICD-10-CM | POA: Insufficient documentation

## 2022-10-05 DIAGNOSIS — G8220 Paraplegia, unspecified: Secondary | ICD-10-CM | POA: Diagnosis not present

## 2022-10-05 DIAGNOSIS — L97511 Non-pressure chronic ulcer of other part of right foot limited to breakdown of skin: Secondary | ICD-10-CM | POA: Insufficient documentation

## 2022-10-05 DIAGNOSIS — L89623 Pressure ulcer of left heel, stage 3: Secondary | ICD-10-CM | POA: Insufficient documentation

## 2022-10-05 DIAGNOSIS — M86679 Other chronic osteomyelitis, unspecified ankle and foot: Secondary | ICD-10-CM | POA: Diagnosis not present

## 2022-10-05 DIAGNOSIS — L89323 Pressure ulcer of left buttock, stage 3: Secondary | ICD-10-CM | POA: Insufficient documentation

## 2022-10-05 DIAGNOSIS — G4730 Sleep apnea, unspecified: Secondary | ICD-10-CM | POA: Insufficient documentation

## 2022-10-05 DIAGNOSIS — L97312 Non-pressure chronic ulcer of right 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: Secondary | ICD-10-CM | POA: Diagnosis not present

## 2022-10-05 DIAGNOSIS — L97322 Non-pressure chronic ulcer of left 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: Secondary | ICD-10-CM | POA: Insufficient documentation

## 2022-10-05 DIAGNOSIS — L97321 Non-pressure chronic ulcer of left ankle limited to breakdown of skin: Secondary | ICD-10-CM | POA: Insufficient documentation

## 2022-10-06 NOTE — Progress Notes (Signed)
Tolley, [name] ([id number]) [id number] Page 1 of 15 Visit Report for 10/05/2022 Arrival Information Details Patient Name: Date of Service: [name], [name] 10/05/2022 8:15 [name] Medical Record Number: [id number] Patient Account Number: [id number] Date of Birth/Sex: Treating RN: 1986-01-29 (36 y.o. [name] Primary Care Leniya Breit: [place], [place] Other Clinician: Referring Savanna Dooley: Treating Veasna Santibanez/Extender: Cornelia Copa [name]: 56 Visit Information History Since Last Visit Added or deleted any medications: No Patient Arrived: Wheel Chair Any new allergies or adverse reactions: No Arrival Time: 08:40 Had [name] fall or experienced change in No Accompanied By: self activities of daily living that may affect Transfer Assistance: None risk of falls: Patient Identification Verified: Yes Signs or symptoms of abuse/neglect since last visito No Secondary Verification Process Completed: Yes Hospitalized since last visit: No Patient Requires Transmission-Based Precautions: No Implantable device outside of the clinic excluding No Patient Has Alerts: Yes cellular tissue based products placed in the center Patient Alerts: R ABI = 1.0 since last visit: L ABI = 1.1 Has Dressing in Place as Prescribed: Yes Has Compression in Place as Prescribed: Yes Pain Present Now: No Electronic Signature(s) Signed: 10/05/2022 3:48:01 PM By: Adline Peals Entered By: Adline Peals on 10/05/2022 08:42:53 -------------------------------------------------------------------------------- Encounter Discharge Information Details Patient Name: Date of Service: [name], [name] 10/05/2022 8:15 [name] Medical Record Number: [id number] Patient Account Number: [id number] Date of Birth/Sex: Treating RN: 02-26-1986 (36 y.o. [name] Primary Care Gailen Venne: [place], [place] Other Clinician: Referring Lynda Wanninger: Treating Bettylee Feig/Extender: Cornelia Copa [name]: 352 Encounter Discharge Information Items Post Procedure Vitals Discharge Condition: Stable Temperature (F): 97.9 Ambulatory Status: Wheelchair Pulse (bpm): 108 Discharge Destination: Home Respiratory Rate (breaths/min): 18 Transportation: Private Auto Blood Pressure (mmHg): 132/81 Accompanied By: self Schedule Follow-up Appointment: Yes Clinical Summary of Care: Patient Declined Electronic Signature(s) Signed: 10/05/2022 5:53:41 PM By: [name] Gouty RN, BSN Entered By: [name] Gouty on 10/05/2022 11:52:48 Kaman, [name] ([id number]) [id number] Page 2 of 15 -------------------------------------------------------------------------------- Lower Extremity Assessment Details Patient Name: Date of Service: [name], [name] 10/05/2022 8:15 [name] Medical Record Number: [id number] Patient Account Number: [id number] Date of Birth/Sex: Treating RN: 1986-02-01 (36 y.o. [name] Primary Care Prisila Dlouhy: [place], [place] Other Clinician: Referring Nyrie Sigal: Treating Liliauna Santoni/Extender: Graciela Husbands, [name]: 352 Edema Assessment Assessed: [Left: No] [Right: No] Edema: [Left: Yes] [Right: Yes] Calf Left: Right: Point of Measurement: 33 cm From Medial Instep 27.5 cm 31 cm Ankle Left: Right: Point of Measurement: 10 cm From Medial Instep 24.8 cm 25 cm Vascular Assessment Pulses: Dorsalis Pedis Palpable: [Left:Yes] [Right:Yes] Electronic Signature(s) Signed: 10/05/2022 3:48:01 PM By: Adline Peals Entered By: Adline Peals on 10/05/2022 08:51:58 -------------------------------------------------------------------------------- Multi Wound Chart Details Patient Name: Date of Service: [name], [name] 10/05/2022 8:15 [name] Medical Record Number: [id number] Patient Account Number: [id number] Date of Birth/Sex: Treating RN: 1986-05-25 (36 y.o. M) Primary Care Derold Dorsch: [place],  [place] Other Clinician: Referring Paiten Boies: Treating Landra Howze/Extender: Graciela Husbands, [name]: 352 Vital Signs Height(in): 70 Pulse(bpm): 108 Weight(lbs): 216 Blood Pressure(mmHg): 132/81 Body Mass Index(BMI): 31 Temperature(F): 97.9 Respiratory Rate(breaths/min): 18 [52:Photos:] [[id number] Page 3 of 15] Right, Dorsal Foot Left, Dorsal Foot Left Upper Leg Wound Location: Gradually Appeared Gradually Appeared Pressure Injury Wounding Event: Trauma, Other Neuropathic Ulcer-Non Diabetic Pressure Ulcer Primary Etiology: Sleep Apnea, Hypertension, Paraplegia Sleep Apnea, Hypertension, Paraplegia Sleep Apnea, Hypertension, Paraplegia Comorbid History: 03/27/2021 07/11/2021 05/24/2022 Date Acquired: 79 63 18 Weeks of Treatment: Open Open  Open Wound Status: No No No Wound Recurrence: No Yes No Clustered Wound: 0.9x4.2x0.1 0.1x0.1x0.1 4.9x3x0.1 Measurements L x W x D (cm) 2.969 0.008 11.545 [name] (cm) : rea 0.297 0.001 1.155 Volume (cm) : -57.50% 99.90% -83.70% % Reduction in [name] rea: -58.00% 99.80% -83.90% % Reduction in Volume: Full Thickness Without Exposed Full Thickness Without Exposed Category/Stage III Classification: Support Structures Support Structures Medium Small Large Exudate Amount: Serosanguineous Serous Serosanguineous Exudate Type: red, brown amber red, brown Exudate Color: No No Yes Foul Odor [name] Cleansing: fter [name] [name] No Odor Anticipated Due to Product Use: Flat and Intact Flat and Intact Flat and Intact Wound Margin: Medium (34-66%) Small (1-33%) Large (67-100%) Granulation [name] mount: Red Red, Pink Red, Friable Granulation Quality: Medium (34-66%) None Present (0%) Small (1-33%) Necrotic Amount: Fat Layer (Subcutaneous Tissue): Yes Fat Layer (Subcutaneous Tissue): Yes Fat Layer (Subcutaneous Tissue): Yes Exposed Structures: Fascia: No Fascia: No Fascia: No Tendon: No Tendon: No Tendon:  No Muscle: No Muscle: No Muscle: No Joint: No Joint: No Joint: No Bone: No Bone: No Bone: No Small (1-33%) Large (67-100%) Small (1-33%) Epithelialization: Debridement - Selective/Open Wound [name] Debridement - Selective/Open Wound Debridement: Pre-procedure Verification/Time Out 09:10 [name] 09:10 Taken: Whitten Tissue Debrided: Non-Viable Tissue [name] Non-Viable Tissue Level: 3.78 [name] 14.7 Debridement [name] (sq cm): rea Curette [name] Curette Instrument: Minimum [name] Minimum Bleeding: Pressure [name] Pressure Hemostasis [name] chieved: Insensate [name] Insensate Procedural Pain: Insensate [name] Insensate Post Procedural Pain: Procedure was tolerated well [name] Procedure was tolerated well Debridement Treatment Response: 0.9x4.2x0.1 [name] 4.9x3x0.1 Post Debridement Measurements L x W x D (cm) 0.297 [name] 1.155 Post Debridement Volume: (cm) [name] [name] Category/Stage III Post Debridement Stage: No Abnormalities Noted No Abnormalities Noted Scarring: Yes Periwound Skin Texture: Dry/Scaly: Yes Dry/Scaly: Yes Dry/Scaly: Yes Periwound Skin Moisture: Maceration: No Maceration: No Erythema: Yes Erythema: Yes No Abnormalities Noted Periwound Skin Color: [name] [name] [name] Erythema Location: Measured: 3cm [name] [name] Erythema Measurement: [name] No Change [name] Erythema Change: No Abnormality Hot No Abnormality Temperature: Debridement [name] Debridement Procedures Performed: Wound Number: [id number] [name] Photos: [name] Right, Anterior Ankle Left, Lateral Lower Leg [name] Wound Location: Pressure Injury Gradually Appeared [name] Wounding Event: Venous Leg Ulcer Venous Leg Ulcer [name] Primary Etiology: Sleep Apnea, Hypertension, Paraplegia Sleep Apnea, Hypertension, Paraplegia [name] Comorbid History: 06/15/2022 06/22/2022 [name] Date Acquired: 16 14 [name] Weeks of Treatment: Open Open [name] Wound Status: No No [name] Wound Recurrence: No No [name] Clustered Wound: 1.4x1.2x0.1 4x1.6x0.2 [name] Measurements L x W x D  (cm) 1.319 5.027 [name] [name] (cm) : rea 0.132 1.005 [name] Volume (cm) : -3.70% 10.40% [name] % Reduction in [name] rea: -3.90% -79.10% [name] % Reduction in Volume: Sheahan, Aadit [name] ([id number]) [id number] Page 4 of 15 Full Thickness Without Exposed Full Thickness Without Exposed [name] Classification: Support Structures Support Structures Medium Medium [name] Exudate Amount: Serosanguineous Serosanguineous [name] Exudate Type: red, brown red, brown [name] Exudate Color: No No [name] Foul Odor [name] Cleansing: fter [name] [name] [name] Odor Anticipated Due to Product Use: Flat and Intact Distinct, outline attached [name] Wound Margin: Medium (34-66%) Medium (34-66%) [name] Granulation [name] mount: Red, Friable Red [name] Granulation Quality: Medium (34-66%) Medium (34-66%) [name] Necrotic Amount: Fat Layer (Subcutaneous Tissue): Yes Fat Layer (Subcutaneous Tissue): Yes [name] Exposed Structures: Fascia: No Fascia: No Tendon: No Tendon: No Muscle: No Muscle: No Joint: No Joint: No Bone: No Bone: No None Small (1-33%) [name] Epithelialization: Debridement - Excisional Debridement - Excisional [name] Debridement: Pre-procedure Verification/Time Out 09:10 09:10 [name]  Taken: Subcutaneous, Slough Subcutaneous, Slough [name] Tissue Debrided: Skin/Subcutaneous Tissue Skin/Subcutaneous Tissue [name] Level: 1.68 6.4 [name] Debridement [name] (sq cm): rea Curette Curette [name] Instrument: Minimum Minimum [name] Bleeding: Pressure Pressure [name] Hemostasis [name] chieved: Insensate Insensate [name] Procedural Pain: Insensate Insensate [name] Post Procedural Pain: Procedure was tolerated well Procedure was tolerated well [name] Debridement Treatment Response: 1.4x1.2x0.1 4x1.6x0.2 [name] Post Debridement Measurements L x W x D (cm) 0.132 1.005 [name] Post Debridement Volume: (cm) [name] [name] [name] Post Debridement Stage: No Abnormalities Noted No Abnormalities Noted [name] Periwound Skin Texture: No Abnormalities Noted Dry/Scaly: Yes  [name] Periwound Skin Moisture: Erythema: Yes Erythema: Yes [name] Periwound Skin Color: [name] Red Streaks [name] Erythema Location: Measured: 1.5cm [name] [name] Erythema Measurement: [name] [name] [name] Erythema Change: No Abnormality No Abnormality [name] Temperature: Debridement Debridement [name] Procedures Performed: Treatment Notes Electronic Signature(s) Signed: 10/05/2022 9:31:57 AM By: Fredirick Maudlin MD FACS Entered By: Fredirick Maudlin on 10/05/2022 09:31:56 -------------------------------------------------------------------------------- Multi-Disciplinary Care Plan Details Patient Name: Date of Service: [name], [name] 10/05/2022 8:15 [name] Medical Record Number: [id number] Patient Account Number: [id number] Date of Birth/Sex: Treating RN: Jul 03, 1986 (36 y.o. [name] Primary Care Starlynn Klinkner: [place], [place] Other Clinician: Referring Glema Takaki: Treating Illene Sweeting/Extender: Cornelia Copa [name]: [place] reviewed with physician Active Inactive Pressure Nursing Diagnoses: Knowledge deficit related to management of pressures ulcers Potential for impaired tissue integrity related to pressure, friction, moisture, and shear Goals: Mattix, Nikolaos [name] ([id number]) [id number] Page 5 of 15 Patient will remain free from development of additional pressure ulcers Date Initiated: 06/21/2018 Date Inactivated: 02/12/2019 Target Resolution Date: 02/16/2019 Unmet Reason: pressure ulcer Goal Status: Unmet reopened on left ischium Patient/caregiver will verbalize risk factors for pressure ulcer development Date Initiated: 06/21/2018 Date Inactivated: 05/28/2019 Target Resolution Date: 06/01/2019 Goal Status: Met Patient/caregiver will verbalize understanding of pressure ulcer management Date Initiated: 06/01/2022 Target Resolution Date: 10/19/2022 Goal Status: Active Interventions: Assess: immobility, friction, shearing,  incontinence upon admission and as needed Assess offloading mechanisms upon admission and as needed Assess potential for pressure ulcer upon admission and as needed Treatment Activities: Pressure reduction/relief device ordered : 12/05/2017 Notes: Wound/Skin Impairment Nursing Diagnoses: Impaired tissue integrity Knowledge deficit related to ulceration/compromised skin integrity Goals: Patient/caregiver will verbalize understanding of skin care regimen Date Initiated: 06/01/2022 Target Resolution Date: 10/19/2022 Goal Status: Active Ulcer/skin breakdown will have [name] volume reduction of 30% by week 4 Date Initiated: 06/01/2022 Date Inactivated: 06/29/2022 Target Resolution Date: 06/29/2022 Unmet Reason: complex wounds, Goal Status: Unmet infection Interventions: Assess patient/caregiver ability to obtain necessary supplies Assess ulceration(s) every visit Provide education on ulcer and skin care Notes: 02/02/21: Complex Care, ongoing. Electronic Signature(s) Signed: 10/05/2022 3:48:01 PM By: Adline Peals Entered By: Adline Peals on 10/05/2022 09:05:18 -------------------------------------------------------------------------------- Pain Assessment Details Patient Name: Date of Service: [name], [name] 10/05/2022 8:15 [name] Medical Record Number: [id number] Patient Account Number: [id number] Date of Birth/Sex: Treating RN: 12/19/1985 (36 y.o. [name] Primary Care Raeshaun Simson: [place], [place] Other Clinician: Referring Hamid Brookens: Treating Addaline Peplinski/Extender: Cornelia Copa [name]: 352 Active Problems Location of Pain Severity and Description of Pain Patient Has Paino No Site Locations Rate the pain. Dante, JAMICHEAL HEARD ([id number]) [id number] Page 6 of 15 Rate the pain. Current Pain Level: 0 Pain Management and Medication Current Pain Management: Electronic Signature(s) Signed: 10/05/2022 3:48:01 PM By: Adline Peals Entered By: Adline Peals on 10/05/2022 08:43:37 -------------------------------------------------------------------------------- Patient/Caregiver Education Details Patient Name: Date of Service: [name], [name] 11/14/2023andnbsp8:15 [name]  Medical Record Number: [id number] Patient Account Number: [id number] Date of Birth/Gender: Treating RN: [birth year] (36 y.o. [name] Primary Care Physician: Janine Limbo Other Clinician: Referring Physician: Treating Physician/Extender: Cornelia Copa [name]: 352 Education Assessment Education Provided To: Patient Education Topics Provided Infection: Methods: Explain/Verbal Responses: Reinforcements needed, State content correctly Pressure: Methods: Explain/Verbal Responses: Reinforcements needed, State content correctly Venous: Methods: Explain/Verbal Responses: Reinforcements needed, State content correctly Wound/Skin Impairment: Methods: Explain/Verbal Responses: Reinforcements needed, State content correctly Electronic Signature(s) Signed: 10/05/2022 3:48:01 PM By: Adline Peals Entered By: Adline Peals on 10/05/2022 09:06:25 Munoz, [name] ([id number]) [id number] Page 7 of 15 -------------------------------------------------------------------------------- Wound Assessment Details Patient Name: Date of Service: [name], [name] 10/05/2022 8:15 [name] Medical Record Number: [id number] Patient Account Number: [id number] Date of Birth/Sex: Treating RN: [birth year] (36 y.o. [name] Primary Care Clinten Howk: [place], [place] Other Clinician: Referring Cherysh Epperly: Treating Reece Mcbroom/Extender: Graciela Husbands, [name]: 352 Wound Status Wound Number: [id number] Primary Etiology: Trauma, Other Wound Location: Right, Dorsal Foot Wound Status: Open Wounding Event: Gradually Appeared Comorbid History: Sleep Apnea, Hypertension,  Paraplegia Date Acquired: 03/27/2021 Weeks Of Treatment: 79 Clustered Wound: No Photos Wound Measurements Length: (cm) 0.9 Width: (cm) 4.2 Depth: (cm) 0.1 Area: (cm) 2.969 Volume: (cm) 0.297 % Reduction in Area: -57.5% % Reduction in Volume: -58% Epithelialization: Small (1-33%) Tunneling: No Undermining: No Wound Description Classification: Full Thickness Without Exposed Suppor Wound Margin: Flat and Intact Exudate Amount: Medium Exudate Type: Serosanguineous Exudate Color: red, brown t Structures Foul Odor After Cleansing: No Slough/Fibrino Yes Wound Bed Granulation Amount: Medium (34-66%) Exposed Structure Granulation Quality: Red Fascia Exposed: No Necrotic Amount: Medium (34-66%) Fat Layer (Subcutaneous Tissue) Exposed: Yes Necrotic Quality: Adherent Slough Tendon Exposed: No Muscle Exposed: No Joint Exposed: No Bone Exposed: No Periwound Skin Texture Texture Color No Abnormalities Noted: Yes No Abnormalities Noted: No Erythema: Yes Moisture Erythema Measurement: Measured No Abnormalities Noted: No 3 cm Dry / Scaly: Yes Temperature / Pain Temperature: No Abnormality Treatment Notes Wound #52 (Foot) Wound Laterality: Dorsal, Right [place], Aaryn [name] ([id number]) [id number] Page 8 of 15 Cleanser Soap and Water Discharge Instruction: May shower and wash wound with dial antibacterial soap and water prior to dressing change. Peri-Wound Care Sween Lotion (Moisturizing lotion) Discharge Instruction: Apply Aquaphor moisturizing lotion as directed Lotrisone Discharge Instruction: or antifungal spray, Apply Lotrisone to periwound Topical Primary Dressing KerraCel Ag Gelling Fiber Dressing, 2x2 in (silver alginate) Discharge Instruction: Apply silver alginate to wound bed as instructed Secondary Dressing Woven Gauze Sponge, Non-Sterile 4x4 in Discharge Instruction: Apply over primary dressing as directed. Secured With [employer], 4.5x3.1 (in/yd) Discharge Instruction: Secure with Kerlix as directed. 24M Medipore H Soft Cloth Surgical T ape, 4 x 10 (in/yd) Discharge Instruction: Secure with tape as directed. Compression Wrap Compression Stockings Circaid Juxta Lite Compression Wrap Quantity: 1 Right Leg Compression Amount: 30-40 mmHg Discharge Instruction: Apply Circaid Juxta Lite Compression Wrap daily as instructed. Apply first thing in the morning, remove at night before bed. Add-Ons Electronic Signature(s) Signed: 10/05/2022 5:53:41 PM By: [name] Gouty RN, BSN Entered By: [name] Gouty on 10/05/2022 08:56:53 -------------------------------------------------------------------------------- Wound Assessment Details Patient Name: Date of Service: [name], [name] 10/05/2022 8:15 [name] Medical Record Number: [id number] Patient Account Number: [id number] Date of Birth/Sex: Treating RN: 1986-10-10 (89 y.o. [name] Primary Care Ranier Coach: [name], [name] Other Clinician: Referring Brennyn Haisley: Treating Saatvik Thielman/Extender: Graciela Husbands, [name]: 352 Wound Status Wound Number: 56 Primary Etiology: Neuropathic  Ulcer-Non Diabetic Wound Location: Left, Dorsal Foot Wound Status: Open Wounding Event: Gradually Appeared Comorbid History: Sleep Apnea, Hypertension, Paraplegia Date Acquired: 07/11/2021 Weeks Of Treatment: 63 Clustered Wound: Yes Photos Somers, Teofil [name] ([id number]) [id number] Page 9 of 15 Wound Measurements Length: (cm) 0.1 Width: (cm) 0.1 Depth: (cm) 0.1 Area: (cm) 0.008 Volume: (cm) 0.001 % Reduction in Area: 99.9% % Reduction in Volume: 99.8% Epithelialization: Large (67-100%) Tunneling: No Undermining: No Wound Description Classification: Full Thickness Without Exposed Support Structures Wound Margin: Flat and Intact Exudate Amount: Small Exudate Type: Serous Exudate Color: amber Foul Odor After Cleansing:  No Slough/Fibrino No Wound Bed Granulation Amount: Small (1-33%) Exposed Structure Granulation Quality: Red, Pink Fascia Exposed: No Necrotic Amount: None Present (0%) Fat Layer (Subcutaneous Tissue) Exposed: Yes Tendon Exposed: No Muscle Exposed: No Joint Exposed: No Bone Exposed: No Periwound Skin Texture Texture Color No Abnormalities Noted: Yes No Abnormalities Noted: No Erythema: Yes Moisture Erythema Change: No Change No Abnormalities Noted: No Dry / Scaly: Yes Temperature / Pain Maceration: No Temperature: Hot Treatment Notes Wound #56 (Foot) Wound Laterality: Dorsal, Left Cleanser Soap and Water Discharge Instruction: May shower and wash wound with dial antibacterial soap and water prior to dressing change. Peri-Wound Care Sween Lotion (Moisturizing lotion) Discharge Instruction: Apply Aquaphor moisturizing lotion as directed Lotrisone Discharge Instruction: or antifungal spray, Apply Lotrisone to periwound Topical Primary Dressing KerraCel Ag Gelling Fiber Dressing, 2x2 in (silver alginate) Discharge Instruction: Apply silver alginate to wound bed as instructed Secondary Dressing Woven Gauze Sponge, Non-Sterile 4x4 in Discharge Instruction: Apply over primary dressing as directed. Secured With [employer], 4.5x3.1 (in/yd) Discharge Instruction: Secure with Kerlix as directed. 3M Medipore H Soft Cloth Surgical T ape, 4 x 10 (in/yd) Discharge Instruction: Secure with tape as directed. Hyslop, PARRY PO ([id number]) [id number] Page 10 of 15 Compression Wrap Compression Stockings Add-Ons Electronic Signature(s) Signed: 10/05/2022 3:48:01 PM By: Adline Peals Signed: 10/05/2022 5:53:41 PM By: [name] Gouty RN, BSN Entered By: [name] Gouty on 10/05/2022 09:05:45 -------------------------------------------------------------------------------- Wound Assessment Details Patient Name: Date of Service: [name], [name]  10/05/2022 8:15 [name] Medical Record Number: [id number] Patient Account Number: [id number] Date of Birth/Sex: Treating RN: April 24, 1986 (36 y.o. [name] Primary Care Derren Suydam: [place], [place] Other Clinician: Referring Laneah Luft: Treating Billy Rocco/Extender: Graciela Husbands, [name]: 352 Wound Status Wound Number: 65 Primary Etiology: Pressure Ulcer Wound Location: Left Upper Leg Wound Status: Open Wounding Event: Pressure Injury Comorbid History: Sleep Apnea, Hypertension, Paraplegia Date Acquired: 05/24/2022 Weeks Of Treatment: 18 Clustered Wound: No Photos Wound Measurements Length: (cm) 4.9 Width: (cm) 3 Depth: (cm) 0.1 Area: (cm) 11.545 Volume: (cm) 1.155 % Reduction in Area: -83.7% % Reduction in Volume: -83.9% Epithelialization: Small (1-33%) Tunneling: No Undermining: No Wound Description Classification: Category/Stage III Wound Margin: Flat and Intact Exudate Amount: Large Exudate Type: Serosanguineous Exudate Color: red, brown Foul Odor After Cleansing: Yes Due to Product Use: No Slough/Fibrino No Wound Bed Granulation Amount: Large (67-100%) Exposed Structure Granulation Quality: Red, Friable Fascia Exposed: No Necrotic Amount: Small (1-33%) Fat Layer (Subcutaneous Tissue) Exposed: Yes Necrotic Quality: Adherent Slough Tendon Exposed: No Muscle Exposed: No Joint Exposed: No Bone Exposed: No Schnitzer, Peyson [name] ([id number]) [id number] Page 11 of 15 Periwound Skin Texture Texture Color No Abnormalities Noted: No No Abnormalities Noted: Yes Scarring: Yes Temperature / Pain Temperature: No Abnormality Moisture No Abnormalities Noted: No Dry / Scaly: Yes Maceration: No Treatment Notes Wound #65 (Upper Leg) Wound Laterality: Left Cleanser Soap and Water Discharge Instruction: May  shower and wash wound with dial antibacterial soap and water prior to dressing change. Peri-Wound Care Zinc Oxide  Ointment 30g tube Discharge Instruction: Apply Zinc Oxide to periwound with each dressing change Topical Keystone antibiotic compound Discharge Instruction: thin layer to wound bed, use daily once available Primary Dressing Hydrofera Blue Classic Foam, 4x4 in Discharge Instruction: Moisten with saline prior to applying to wound bed Secondary Dressing Zetuvit Plus Silicone Border Dressing 5x5 (in/in) Discharge Instruction: Apply silicone border over primary dressing as directed. Secured With Compression Wrap Compression Stockings Environmental education officer) Signed: 10/05/2022 5:53:41 PM By: [name] Gouty RN, BSN Entered By: [name] Gouty on 10/05/2022 09:04:48 -------------------------------------------------------------------------------- Wound Assessment Details Patient Name: Date of Service: [name], [name] 10/05/2022 8:15 [name] Medical Record Number: [id number] Patient Account Number: [id number] Date of Birth/Sex: Treating RN: July 28, 1986 (36 y.o. [name] Primary Care Sabri Teal: [place], [place] Other Clinician: Referring Kolin Erdahl: Treating Neesha Langton/Extender: Graciela Husbands, [name]: 352 Wound Status Wound Number: [id number] Primary Etiology: Venous Leg Ulcer Wound Location: Right, Anterior Ankle Wound Status: Open Wounding Event: Pressure Injury Comorbid History: Sleep Apnea, Hypertension, Paraplegia Date Acquired: 06/15/2022 Weeks Of Treatment: 16 Clustered Wound: No Photos Tsuda, Karsyn [name] ([id number]) [id number] Page 12 of 15 Wound Measurements Length: (cm) 1.4 Width: (cm) 1.2 Depth: (cm) 0.1 Area: (cm) 1.319 Volume: (cm) 0.132 % Reduction in Area: -3.7% % Reduction in Volume: -3.9% Epithelialization: None Tunneling: No Undermining: No Wound Description Classification: Full Thickness Without Exposed Suppor Wound Margin: Flat and Intact Exudate Amount: Medium Exudate Type: Serosanguineous Exudate  Color: red, brown t Structures Foul Odor After Cleansing: No Slough/Fibrino Yes Wound Bed Granulation Amount: Medium (34-66%) Exposed Structure Granulation Quality: Red, Friable Fascia Exposed: No Necrotic Amount: Medium (34-66%) Fat Layer (Subcutaneous Tissue) Exposed: Yes Necrotic Quality: Adherent Slough Tendon Exposed: No Muscle Exposed: No Joint Exposed: No Bone Exposed: No Periwound Skin Texture Texture Color No Abnormalities Noted: Yes No Abnormalities Noted: No Erythema: Yes Moisture Erythema Measurement: Measured No Abnormalities Noted: Yes 1.5 cm Temperature / Pain Temperature: No Abnormality Treatment Notes Wound #66 (Ankle) Wound Laterality: Right, Anterior Cleanser Soap and Water Discharge Instruction: May shower and wash wound with dial antibacterial soap and water prior to dressing change. Peri-Wound Care Sween Lotion (Moisturizing lotion) Discharge Instruction: Apply Aquaphor moisturizing lotion as directed Topical Primary Dressing KerraCel Ag Gelling Fiber Dressing, 2x2 in (silver alginate) Discharge Instruction: Apply silver alginate to wound bed as instructed Secondary Dressing Secured With Kerlix Roll Sterile, 4.5x3.1 (in/yd) Discharge Instruction: Secure with Kerlix as directed. 51M Medipore H Soft Cloth Surgical T ape, 4 x 10 (in/yd) Discharge Instruction: Secure with tape as directed. Compression Wrap Compression Stockings Kakar, Alphonsa [name] ([id number]) [phone].pdf Page 13 of 15 Add-Ons Electronic Signature(s) Signed: 10/05/2022 3:48:01 PM By: Adline Peals Signed: 10/05/2022 5:53:41 PM By: [name] Gouty RN, BSN Entered By: [name] Gouty on 10/05/2022 09:07:32 -------------------------------------------------------------------------------- Wound Assessment Details Patient Name: Date of Service: [name], [name] 10/05/2022 8:15 [name] Medical Record Number: [id number] Patient Account Number: [id number] Date of  Birth/Sex: Treating RN: 01-09-1986 (36 y.o. [name] Primary Care Alastor Kneale: [place], [place] Other Clinician: Referring Poppy Mcafee: Treating Heidee Audi/Extender: Graciela Husbands, [name]: 352 Wound Status Wound Number: [id number] Primary Etiology: Venous Leg Ulcer Wound Location: Left, Lateral Lower Leg Wound Status: Open Wounding Event: Gradually Appeared Comorbid History: Sleep Apnea, Hypertension, Paraplegia Date Acquired: 06/22/2022 Weeks Of Treatment: 14 Clustered Wound: No Photos Wound Measurements Length: (cm) 4 Width: (cm) 1.6 Depth: (cm) 0.2 Area: (  cm) 5.027 Volume: (cm) 1.005 % Reduction in Area: 10.4% % Reduction in Volume: -79.1% Epithelialization: Small (1-33%) Tunneling: No Undermining: No Wound Description Classification: Full Thickness Without Exposed Suppor Wound Margin: Distinct, outline attached Exudate Amount: Medium Exudate Type: Serosanguineous Exudate Color: red, brown t Structures Foul Odor After Cleansing: No Slough/Fibrino Yes Wound Bed Granulation Amount: Medium (34-66%) Exposed Structure Granulation Quality: Red Fascia Exposed: No Necrotic Amount: Medium (34-66%) Fat Layer (Subcutaneous Tissue) Exposed: Yes Necrotic Quality: Adherent Slough Tendon Exposed: No Muscle Exposed: No Joint Exposed: No Bone Exposed: No Periwound Skin Texture Texture Color No Abnormalities Noted: Yes No Abnormalities Noted: No Erythema: Yes Moisture Erythema Location: [place] Yamin, Gehrig [name] ([id number]) [id number].pdf Page 14 of 15 Erythema Location: [place] No Abnormalities Noted: No Dry / Scaly: Yes Temperature / Pain Temperature: No Abnormality Treatment Notes Wound #67 (Lower Leg) Wound Laterality: Left, Lateral Cleanser Soap and Water Discharge Instruction: May shower and wash wound with dial antibacterial soap and water prior to dressing change. Peri-Wound Care Triamcinolone 15 (g) Discharge  Instruction: Use triamcinolone 15 (g) as directed Sween Lotion (Moisturizing lotion) Discharge Instruction: Apply Aquaphor moisturizing lotion as directed Lotrisone Discharge Instruction: Apply Lotrisone to periwound Topical Mupirocin Ointment Discharge Instruction: Apply Mupirocin (Bactroban) as instructed Primary Dressing KerraCel Ag Gelling Fiber Dressing, 2x2 in (silver alginate) Discharge Instruction: Apply silver alginate to wound bed as instructed Secondary Dressing Woven Gauze Sponge, Non-Sterile 4x4 in Discharge Instruction: Apply over primary dressing as directed. Secured With [employer], 4.5x3.1 (in/yd) Discharge Instruction: Secure with Kerlix as directed. 17M Medipore H Soft Cloth Surgical T ape, 4 x 10 (in/yd) Discharge Instruction: Secure with tape as directed. Compression Wrap Compression Stockings Add-Ons Electronic Signature(s) Signed: 10/05/2022 3:48:01 PM By: Adline Peals Signed: 10/05/2022 5:53:41 PM By: [name] Gouty RN, BSN Entered By: [name] Gouty on 10/05/2022 09:08:21 -------------------------------------------------------------------------------- Vitals Details Patient Name: Date of Service: [place], [name] 10/05/2022 8:15 [name] Medical Record Number: [id number] Patient Account Number: [id number] Date of Birth/Sex: Treating RN: [birth year] (36 y.o. [name] Primary Care Amelia Burgard: [place], [place] Other Clinician: Referring Calistro Rauf: Treating Kanishk Stroebel/Extender: Graciela Husbands, [name]: 352 Vital Signs Time Taken: 08:42 Temperature (F): 97.9 Height (in): 70 Pulse (bpm): 108 Weight (lbs): 216 Respiratory Rate (breaths/min): 18 Body Mass Index (BMI): 31 Blood Pressure (mmHg): 132/81 Reference Range: 80 - 120 mg / dl Wan, Chet [name] ([id number]) [id number].pdf Page 15 of 15 Electronic Signature(s) Signed: 10/05/2022 3:48:01 PM By: Adline Peals Entered By: Adline Peals on 10/05/2022 08:43:26

## 2022-10-06 NOTE — Progress Notes (Signed)
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 the warmth and swelling is improved. I am hopeful that the cellulitis part of this control. I wouldn't be surprised if there is an element of venous inflammation as well. 01/17/17. The erythema is present but better in the left leg. His left lateral leg wound still does not have [name] viable surface buttons certain parts of this long thin wound it appears like there has been improvement in dimensions. 01/20/17; the erythema still present but much better in the left leg. I'm thinking this is his usual degree of chronic venous inflammation. The wound on the left leg looks somewhat better. Is less surface slough 01/27/17; erythema is back to the chronic venous inflammation. The wound on the left leg is somewhat better. I am back to the point where I like to try an Quirino, Kentley [name] ([id number]) [phone].pdf Page 6 of 37 Apligraf once again 02/10/17; slight improvement in wound dimensions. Apligraf #2. He is completing his doxycycline 02/14/17; patient arrives today having completed doxycycline last Thursday. This was supposed to be [name] nurse visit however once again he hasn't tense erythema from the medial part of his wound extending over the lower leg. Also erythema in his foot this is roughly in the same distribution as last time. He has baseline chronic venous inflammation however this is [name] lot worse than  the baseline I have learned to accept the on him is baseline inflammation 02/24/17- patient is here for follow-up evaluation. He is tolerating compression therapy. His voicing no complaints or concerns he is here anticipating an Apligraf 03/03/17; he arrives today with an adherent necrotic surface. I don't think this is surface is going to be amenable for Apligraf's. The erythema around his wound and on the left dorsal foot has resolved he is off antibiotics 03/10/17; better-looking surface today. I don't think he can tolerate Apligraf's. He tells me he had [name] wound VAC after [name] skin graft years ago to this area and they had difficulty with [name] seal. The erythema continues to be stable around this some degree of chronic venous inflammation but he also has recurrent cellulitis. We have been using Iodoflex 03/17/17; continued improvement in the surface and may be small changes in dimensions. Using Iodoflex which seems the only thing that will control his surface 03/24/17- He is here for follow up evaluation of his LLE lateral ulceration and ulcer to right dorsal foot/toe space. He is voicing no complaints or concerns, He is tolerating compression wrap. 03/31/17 arrives today with [name] much healthier looking wound on the left lower extremity. We have been using Iodoflex for [name]  prolonged period of time which has for the first time prepared and adequate looking wound bed although we have not had much in the way of wound dimension improvement. He also has [name] small wound between the first and second toe on the right 04/07/17; arrives today with [name] healthy-looking wound bed and at least the top 50% of this wound appears to be now her. No debridement was required I have changed him to [facility] last week after prolonged Iodoflex. He did not do well with Apligraf's. We've had [name] re-opening between the first and second toe on the right 04/14/17; arrives today with [name] healthier looking wound bed contractions and the top 50% of  this wound and some on the lesser 50%. Wound bed appears healthy. The area between the first and second toe on the right still remains problematic 04/21/17; continued very gradual improvement. Using [facility] 04/28/17; continued very gradual improvement in the left lateral leg venous insufficiency wound. His periwound erythema is very mild. We have been using Hydrofera Blue. Wound is making progress especially in the superior 50% 05/05/17; he continues to have very gradual improvement in the left lateral venous insufficiency wound. Both in terms with an length rings are improving. I debrided this every 2 weeks with #5 curet and we have been using Hydrofera Blue and again making good progress With regards to the wounds between his right first and second toe which I thought might of been tinea pedis he is not making as much progress very dry scaly skin over the area. Also the area at the base of the left first and second toe in [name] similar condition 05/12/17; continued gradual improvement in the refractory left lateral venous insufficiency wound on the left. Dimension smaller. Surface still requiring debridement using Hydrofera Blue 05/19/17; continued gradual improvement in the refractory left lateral venous ulceration. Careful inspection of the wound bed underlying rumination suggested some degree of epithelialization over the surface no debridement indicated. Continue Hydrofera Blue difficult areas between his toes first and third on the left than first and second on the right. I'm going to change to silver alginate from silver collagen. Continue ketoconazole as I suspect underlying tinea pedis 05/26/17; left lateral leg venous insufficiency wound. We've been using Hydrofera Blue. I believe that there is expanding epithelialization over the surface of the wound albeit not coming from the wound circumference. This is [name] bit of an odd situation in which the epithelialization seems to be coming from the  surface of the wound rather than in the exact circumference. There is still small open areas mostly along the lateral margin of the wound. He has unchanged areas between the left first and second and the right first second toes which I been treating for tenia pedis 06/02/17; left lateral leg venous insufficiency wound. We have been using Hydrofera Blue. Somewhat smaller from the wound circumference. The surface of the wound remains [name] bit on it almost epithelialized sedation in appearance. I use an open curette today debridement in the surface of all of this especially the edges Small open wounds remaining on the dorsal right first and second toe interspace and the plantar left first second toe and her face on the left 06/09/17; wound on the left lateral leg continues to be smaller but very gradual and very dry surface using Hydrofera Blue 06/16/17 requires weekly debridements now on the left lateral leg although this continues to contract. I changed to silver collagen last week because of dryness of the wound bed.  Using Iodoflex to the areas on his first and second toes/web space bilaterally 06/24/17; patient with history of paraplegia also chronic venous insufficiency with lymphedema. Has [name] very difficult wound on the left lateral leg. This has been gradually reducing in terms of with but comes in with [name] very dry adherent surface. High switch to silver collagen [name] week or so ago with hydrogel to keep the area moist. This is been refractory to multiple dressing attempts. He also has areas in his first and second toes bilaterally in the anterior and posterior web space. I had been using Iodoflex here after [name] prolonged course of silver alginate with ketoconazole was ineffective [question tinea pedis] 07/14/17; patient arrives today with [name] very difficult adherent material over his left lateral lower leg wound. He also has surrounding erythema and poorly controlled edema. He was switched his Santyl last visit  which the nurses are applying once during his doctor visit and once on [name] nurse visit. He was also reduced to 2 layer compression I'm not exactly sure of the issue here. 07/21/17; better surface today after 1 week of Iodoflex. Significant cellulitis that we treated last week also better. [Doxycycline] 07/28/17 better surface today with now 2 weeks of Iodoflex. Significant cellulitis treated with doxycycline. He has now completed the doxycycline and he is back to his usual degree of chronic venous inflammation/stasis dermatitis. He reminds me he has had ablations surgery here 08/04/17; continued improvement with Iodoflex to the left lateral leg wound in terms of the surface of the wound although the dimensions are better. 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 Problematic areas on the plantar aspect of the first second toe web space on the left and the dorsal aspect of the first second toe web space on the right. At one point I felt these were probably related to chronic fungal infections in treated him aggressively for this although we have not made any improvement here. 08/11/17; left lateral leg. Surface continues to improve with the Iodoflex although we are not seeing much improvement in overall wound dimensions. Areas on his plantar left foot and right foot show no improvement. In fact the right foot looks somewhat worse 08/18/17; left lateral leg. We changed to [facility] Blue last week after [name] prolonged course of Iodoflex which helps get the surface better. It appears that the wound with is improved. Continue with difficult areas on the left dorsal first second and plantar first second on the right 09/01/17; patient arrives in clinic today having had [name] temperature of 103 yesterday. He was seen in the ER and [facility]. The patient was concerned he could have cellulitis again in the right leg however they diagnosed him with [name] UTI and  he is now on Keflex. He has [name] history of cellulitis which is been recurrent and difficult but this is been in the left leg, in the past 5 use doxycycline. He does in and out catheterizations at home which are risk factors for UTI 09/08/17; patient will be completing his Keflex this weekend. The erythema on the left leg is considerably better. He has [name] new wound today on the medial part of the right leg small superficial almost looks like [name] skin tear. He has worsening of the area on the right dorsal first and second toe. His [name] area on the left lateral leg is better. Using Hydrofera Blue on all areas 09/15/17; gradual reduction in width on the long wound in  the left lateral leg. No debridement required. He also has wounds on the plantar aspect of his left first second toe web space and on the dorsal aspect of the right first second toe web space. 09/22/17; there continues to be very gradual improvements in the dimensions of the left lateral leg wound. He hasn't round erythematous spot with might be pressure on his wheelchair. There is no evidence obviously of infection no purulence no warmth He has [name] dry scaled area on the plantar aspect of the left first second toe Improved area on the dorsal right first second toe. 09/29/17; left lateral leg wound continues to improve in dimensions mostly with an is still [name] fairly long but increasingly narrow wound. He has [name] dry scaled area on the plantar aspect of his left first second toe web space Increasingly concerning area on the dorsal right first second toe. In fact I am concerned today about possible cellulitis around this wound. The areas extending up his second toe and although there is deformities here almost appears to abut on the nailbed. 10/06/17; left lateral leg wound continues to make very gradual progress. Tissue culture I did from the right first second toe dorsal foot last time grew MRSA and enterococcus which was vancomycin sensitive. This was  not sensitive to clindamycin or doxycycline. He is allergic to Zyvox and sulfa we have therefore arrange for him to have dalvance infusion tomorrow. He is had this in the past and tolerated it well 10/20/17; left lateral leg wound continues to make decent progress. This is certainly reduced in terms of with there is advancing epithelialization.The cellulitis in the right foot looks better although he still has [name] deep wound in the dorsal aspect of the first second toe web space. Plantar left first toe web space on the left I think is making some progress 10/27/17; left lateral leg wound continues to make decent progress. Advancing epithelialization.using Hydrofera Blue The right first second toe web space wound is better-looking using silver alginate Improvement in the left plantar first second toe web space. Again using silver alginate 11/03/17 left lateral leg wound continues to make decent progress albeit slowly. Using [facility] Craze, Dewan [name] ([id number]) [id number] Page 7 of 37 The right per second toe web space continues to be [name] very problematic looking punched out wound. I obtained [name] piece of tissue for deep culture I did extensively treated this for fungus. It is difficult to imagine that this is [name] pressure area as the patient states other than going outside he doesn't really wear shoes at home The left plantar first second toe web space looked fairly senescent. Necrotic edges. This required debridement change to [facility] Blue to all wound areas 11/10/17; left lateral leg wound continues to contract. Using Hydrofera Blue On the right dorsal first second toe web space dorsally. Culture I did of this area last week grew MRSA there is not an easy oral option in this patient was multiple antibiotic allergies or intolerances. This was only [name] rare culture isolate I'm therefore going to use Bactroban under silver alginate On the left plantar first second toe web  space. Debridement is required here. This is also unchanged 11/17/17; left lateral leg wound continues to contract using Hydrofera Blue this is no longer the [name] issue. The [name] concern here is the right first second toe web space. He now has an open area going from dorsally to the plantar aspect. There is now wound on the inner lateral part of the first  toe. Not [name] very viable surface on this. There is erythema spreading medially into the forefoot. No [name] change in the left first second toe plantar wound 11/24/17; left lateral leg wound continues to contract using Hydrofera Blue. Nice improvement today The right first second toe web space all of this looks [name] lot less angry than last week. I have given him clindamycin and topical Bactroban for MRSA and terbinafine for the possibility of underlining tinea pedis that I could not control with ketoconazole. Looks somewhat better The area on the plantar left first second toe web space is weeping with dried debris around the wound 12/01/17; left lateral leg wound continues to contract he Hydrofera Blue. It is becoming thinner in terms of with nevertheless it is making good improvement. The right first second toe web space looks less angry but still [name] large necrotic-looking wounds starting on the plantar aspect of the right foot extending between the toes and now extensively on the base of the right second toe. I gave him clindamycin and topical Bactroban for MRSA anterior benefiting for the possibility of underlying tinea pedis. Not looking better today The area on the left first/second toe looks better. Debrided of necrotic debris 12/05/17* the patient was worked in urgently today because over the weekend he found blood on his incontinence bad when he woke up. He was found to have an ulcer by his wife who does most of his wound care. He came in today for [place] to look at this. He has not had [name] history of wounds in his buttocks in spite of his  paraplegia. 12/08/17; seen in follow-up today at his usual appointment. He was seen earlier this week and found to have [name] new wound on his buttock. We also follow him for wounds on the left lateral leg, left first second toe web space and right first second toe web space 12/15/17; we have been using Hydrofera Blue to the left lateral leg which has improved. The right first second toe web space has also improved. Left first second toe web space plantar aspect looks stable. The left buttock has worsened using Santyl. Apparently the buttock has drainage 12/22/17; we have been using Hydrofera Blue to the left lateral leg which continues to improve now 2 small wounds separated by normal skin. He tells [place] he had [name] fever up to 100 yesterday he is prone to UTIs but has not noted anything different. He does in and out catheterizations. The area between the first and second toes today does not look good necrotic surface covered with what looks to be purulent drainage and erythema extending into the third toe. I had gotten this to something that I thought look better last time however it is not look good today. He also has [name] necrotic surface over the buttock wound which is expanded. I thought there might be infection under here so I removed [name] lot of the surface with [name] #5 curet though nothing look like it really needed culturing. He is been using Santyl to this area 12/27/17; his original wound on the left lateral leg continues to improve using Hydrofera Blue. I gave him samples of Baxdella although he was unable to take them out of fear for an allergic reaction ["lump in his throat"].the culture I did of the purulent drainage from his second toe last week showed both enterococcus and [name] set Enterobacter I was also concerned about the erythema on the bottom of his foot although paradoxically although this looks somewhat better  today. 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 We've been using silver alginate here. They came up today that he sleeps in [name] recliner, I'm not sure why but I asked him to stop this 01/03/18; his original wound we've been using Hydrofera Blue is now separated into 2 areas. Ulcer on his left buttock is better he is off the recliner and sleeping in bed Finally both wound areas between his first and second toes also looks some better 01/10/18; his original wound on the left lateral leg is now separated into 2 wounds we've been using Hydrofera Blue Ulcer on his left buttock has some drainage. There is [name] small probing site going into muscle layer superiorly.using silver alginate -He arrives today with [name] deep tissue injury on the left heel The wound on the dorsal aspect of his first second toe on the left looks [name] lot betterusing silver alginate ketoconazole The area on the first second toe web space on the right also looks [name] lot bette 01/17/18; his original wound on the left lateral leg continues to progress using Hydrofera Blue Ulcer on his left buttock also is smaller surface healthier except for [name] small probing site going into the muscle layer superiorly. 2.4 cm of tunneling in this area DTI on his left heel we have only been offloading. Looks better than last week no threatened open no evidence of infection the wound on the dorsal aspect of the first second toe on the left continues to look like it's regressing we have only been using silver alginate and terbinafine orally The area in the first second toe web space on the right also looks to be [name] lot better using silver alginate and terbinafine I think this was prompted by tinea pedis 01/31/18; the patient was hospitalized in [place] last week apparently for [name] complicated UTI. He was discharged on cefepime he does in and out catheterizations. In the hospital he was discovered M I don't mild elevation of AST and ALT and the terbinafine was  stopped.predictably the pressure ulcer on s his buttock looks betterusing silver alginate. The area on the left lateral leg also is better using Hydrofera Blue. The area between the first and second toes on the left better. First and second toes on the right still substantial but better. Finally the DTI on the left heel has held together and looks like it's resolving 02/07/18-he is here in follow-up evaluation for multiple ulcerations. He has new injury to the lateral aspect of the last issue [name] pressure ulcer, he states this is from adhesive removal trauma. He states he has tried multiple adhesive products with no success. All other ulcers appear stable. The left heel DTI is resolving. We will continue with same treatment plan and follow-up next week. 02/14/18; follow-up for multiple areas. He has [name] new area last week on the lateral aspect of his pressure ulcer more over the posterior trochanter. The original pressure ulcer looks quite stable has healthy granulation. 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 Almost fully epithelialized on the original superior area using Hydrofera Blue DTI on the left heel has peeled off this week to reveal [name] small superficial wound under denuded skin and subcutaneous tissue Both areas between the first and second toes look better including nothing open on the left 02/21/18; The patient's wounds on  his left ischial tuberosity and posterior left greater trochanter actually looked better. He has [name] large area of irritation around the area which I think is contact dermatitis. I am doubtful that this is fungal His original wound on the left lateral calf continues to improve we have been using Hydrofera Blue There is no open area in the left first second toe web space although there is [name] lot of thick callus The DTI on the left heel required debridement today of necrotic  surface eschar and subcutaneous tissue using silver alginate Finally the area on the right first second toe webspace continues to contract using silver alginate and ketoconazole 02/28/18 Left ischial tuberosity wounds look better using silver alginate. Original wound on the left calf only has one small open area left using Hydrofera Blue DTI on the left heel required debridement mostly removing skin from around this wound surface. Using silver alginate The areas on the right first/second toe web space using silver alginate and ketoconazole 03/08/18 on evaluation today patient appears to be doing decently well as best I can tell in regard to his wounds. This is the first time that I have seen him as he generally is followed by Dr. Dellia Nims. With that being said none of his wounds appear to be infected he does have an area where there is some skin covering [place], [place] ([id number]) [id number] Page 8 of 37 what appears to be [name] new wound on the left dorsal surface of his great toe. This is right at the nail bed. With that being said I do believe that debrided away some of the excess skin can be of benefit in this regard. Otherwise he has been tolerating the dressing changes without complication. 03/14/18; patient arrives today with the multiplicity of wounds that we are following. He has not been systemically unwell Original wound on the left lateral calf now only has 2 small open areas we've been using Hydrofera Blue which should continue The deep tissue injury on the left heel requires debridement today. We've been using silver alginate The left first second toe and the right first second toe are both are reminiscence what I think was tinea pedis. Apparently some of the callus Surface between the toes was removed last week when it started draining. Purulent drainage coming from the wound on the ischial tuberosity on the left. 03/21/18-He is here in follow-up evaluation for  multiple wounds. 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 He tolerated debridement. The only change to last week's recommendations is to discontinue antifungal cream between toes. He will follow-up next week 03/28/18; following up for multiple wounds;Concern this week is streaking redness and swelling in the right foot. He is going to need antibiotics for this. 03/31/18; follow-up for right foot cellulitis. Streaking redness and swelling in the right foot on 03/28/18. 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 I put him on clindamycin 300 mg every 6 and brought him in for [name] quick check. He has an open wound between his first and second toes on the right foot as [name] potential source. 04/04/18; Right foot cellulitis is resolving he is completing clindamycin. This is truly good news Left lateral calf wound which is initial wound only has one small open area inferiorly this is close to healing out. He has compression stockings. We will use Hydrofera Blue right down to the epithelialization of this Nonviable surface on the left heel which was initially pressure  with [name] DTI. We've been using Hydrofera Blue. I'm going to switch this back to silver alginate Left first second toe/tinea pedis this looks better using silver alginate Right first second toe tinea pedis using silver alginate Large pressure ulcers on theLeft ischial tuberosity. Small wound here Looks better. I am uncertain about the surface over the large wound. Using silver alginate 04/11/18; Cellulitis in the right foot is resolved Left lateral calf wound which was his original wounds still has 2 tiny open areas remaining this is just about closed Nonviable surface on the left heel is better but still requires debridement Left first second toe/tinea pedis still open using silver alginate Right first second toe wound tinea pedis I asked him to go back to  using ketoconazole and silver alginate Large pressure ulcers on the left ischial tuberosity this shear injury here is resolved. Wound is smaller. No evidence of infection using silver alginate 04/18/18; Patient arrives with an intense area of cellulitis in the right mid lower calf extending into the right heel area. Bright red and warm. Smaller area on the left anterior leg. He has [name] significant history of MRSA. He will definitely need antibioticsdoxycycline He now has 2 open areas on the left ischial tuberosity the original large wound and now [name] satellite area which I think was above his initial satellite areas. Not [name] wonderful surface on this satellite area surrounding erythema which looks like pressure related. His left lateral calf wound again his original wound is just about closed Left heel pressure injury still requiring debridement Left first second toe looks [name] lot better using silver alginate Right first second toe also using silver alginate and ketoconazole cream also looks better 04/20/18; the patient was worked in early today out of concerns with his cellulitis on the right leg. I had started him on doxycycline. This was 2 days ago. His wife was concerned about the swelling in the area. Also concerned about the left buttock. He has not been systemically unwell no fever chills. No nausea vomiting or diarrhea 04/25/18; the patient's left buttock wound is continued to deteriorate he is using Hydrofera Blue. He is still completing clindamycin for the cellulitis on the right leg although all of this looks better. 05/02/18 Left buttock wound still with [name] lot of drainage and [name] very tightly adherent fibrinous necrotic surface. He has [name] deeper area superiorly The left lateral calf wound is still closed DTI wound on the left heel necrotic surface especially the circumference using Iodoflex Areas between his left first second toe and right first second toe both look better. Dorsally and the right  first second toe he had [name] necrotic surface although at smaller. In using silver alginate and ketoconazole. I did [name] culture last week which was [name] deep tissue culture of the reminiscence of the open wound on the right first second toe dorsally. This grew [name] few Acinetobacter and [name] few methicillin-resistant staph aureus. Nevertheless the area actually this week looked better. I didn't feel the need to specifically address this at least in terms of systemic antibiotics. 05/09/18; wounds are measuring larger more drainage per our intake. We are using Santyl covered with alginate on the large superficial buttock wounds, Iodosorb on the left heel, ketoconazole and silver alginate to the dorsal first and second toes bilaterally. 05/16/18; The area on his left buttock better in some aspects although the area superiorly over the ischial tuberosity required an extensive debridement.using Santyl Left heel appears stable. Using Iodoflex The areas between his first  and second toes are not bad however there is spreading erythema up the dorsal aspect of his left foot this looks like cellulitis again. He is insensate the erythema is really very brilliant.o Erysipelas He went to see an allergist days ago because he was itching part of this he had lab work done. This showed [name] white count of 15.1 with 70% neutrophils. Hemoglobin of 11.4 and [name] platelet count of 659,000. Last white count we had in Epic was [name] 2-1/2 years ago which was 25.9 but he was ill at the time. He was able to show me some lab work that was done by his primary physician the pattern is about the same. 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 But prompted me to go ahead and do x-rays of both feet and the pelvis rule out osteomyelitis. He also had [name] comprehensive metabolic panel this was reasonably normal his albumin was 3.7 liver function tests BUN/creatinine all normal 05/23/18; x-rays of both his feet from  last week were negative for underlying pulmonary abnormality. The x-ray of his pelvis however showed mild irregularity in the left ischial which may represent some early osteomyelitis. The wound in the left ischial continues to get deeper clearly now exposed muscle. Each week necrotic surface material over this area. Whereas the rest of the wounds do not look so bad. The left ischial wound we have been using Santyl and calcium alginate T the left heel surface necrotic debris using Iodoflex o The left lateral leg is still healed Areas on the left dorsal foot and the right dorsal foot are about the same. There is some inflammation on the left which might represent contact dermatitis, fungal dermatitis I am doubtful cellulitis although this looks better than last week 05/30/18; CT scan done at Hospital did not show any osteomyelitis or abscess. Suggested the possibility of underlying cellulitis although I don't see [name] lot of evidence of this at the bedside The wound itself on the left buttock/upper thigh actually looks somewhat better. No debridement Left heel also looks better no debridement continue Iodoflex Both dorsal first second toe spaces appear better using Lotrisone. Left still required debridement 06/06/18; Intake reported some purulent looking drainage from the left gluteal wound. Using Santyl and calcium alginate Left heel looks better although still [name] nonviable surface requiring debridement The left dorsal foot first/second webspace actually expanding and somewhat deeper. I may consider doing [name] shave biopsy of this area Right dorsal foot first/second webspace appears stable to improved. Using Lotrisone and silver alginate to both these areas 06/13/18 Left gluteal surface looks better. Now separated in the 2 wounds. No debridement required. Still drainage. We'll continue silver alginate Left heel continues to look better with Iodoflex continue this for at least another week Of his dorsal  foot wounds the area on the left still has some depth although it looks better than last week. We've been using Lotrisone and silver alginate 06/20/18 Left gluteal continues to look better healthy tissue Massett, Terel [name] ([id number]) [id number] Page 9 of 37 Left heel continues to look better healthy granulation wound is smaller. He is using Iodoflex and his long as this continues continue the Iodoflex Dorsal right foot looks better unfortunately dorsal left foot does not. There is swelling and erythema of his forefoot. He had minor trauma to this several days ago but doesn't think this was enough to have caused any tissue injury. Foot looks like cellulitis, we have had this  problem before 06/27/18 on evaluation today patient appears to be doing [name] little worse in regard to his foot ulcer. 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 Both of which would really be his only options for treating this infection. In the past he has been given and effusion of Orbactiv. This is done very well for him in the past again it's one time dosing IV antibiotic therapy. Subsequently I do believe this is something we're gonna need to see about doing at this point in time. Currently his other wounds seem to be doing somewhat better in my pinion I'm pretty happy in that regard. 07/03/18 on evaluation today patient's wounds actually appear to be doing fairly well. He has been tolerating the dressing changes without complication. All in all he seems to be showing signs of improvement. In regard to the antibiotics he has been dealing with infectious disease since I saw him last week as far as getting this scheduled. In the end [facility]'s [facility] this coming Friday. In the meantime he has been continuing to  perform the dressing changes in such as previous. There does not appear to be any evidence of infection worsengin at this time. 07/10/18; Since I last saw this man 2 weeks ago things have actually improved. IV antibiotics of resulted in less forefoot erythema although there is still some present. He is not systemically unwell Left buttock wounds 2 now have no depth there is increased epithelialization Using silver alginate Left heel still requires debridement using Iodoflex Left dorsal foot still with [name] sizable wound about the size of [name] border but healthy granulation Right dorsal foot still with [name] slitlike area using silver alginate 07/18/18; the patient's cellulitis in the left foot is improved in fact I think it is on its way to resolving. Left buttock wounds 2 both look better although the larger one has hypertension granulation we've been using silver alginate Left heel has some thick circumferential redundant skin over the wound edge which will need to be removed today we've been using Iodoflex Left dorsal foot is still [name] sizable wound required debridement using silver alginate The right dorsal foot is just about closed only [name] small open area remains here 07/25/18; left foot cellulitis is resolved Left buttock wounds 2 both look better. Hyper-granulation on the [name] area Left heel as some debris over the surface but otherwise looks [name] healthier wound. Using silver collagen Right dorsal foot is just about closed 07/31/18; arrives with our intake nurse worried about purulent drainage from the buttock. We had hyper-granulation here last week His buttock wounds 2 continue to look better Left heel some debris over the surface but measuring smaller. Right dorsal foot unfortunately has openings between the toes Left foot superficial wound looks less aggravated. 08/07/18 Buttock wounds continue to look better although some of her granulation and the larger medial wound. silver alginate Left heel  continues to look [name] lot better.silver collagen Left foot superficial wound looks less stable. Requires debridement. He has [name] new wound superficial area on the foot on the lateral dorsal foot. Right foot looks better using silver alginate without Lotrisone 08/14/2018; patient was in the ER last week diagnosed with [name] UTI. He is now on Cefpodoxime and Macrodantin. Buttock wounds continued to be smaller. Using silver alginate Left  heel continues to look better using silver collagen Left foot superficial wound looks as though it is improving Right dorsal foot area is just about healed. 08/21/2018; patient is completed his antibiotics for his UTI. He has 2 open areas on the buttocks. There is still not closed although the surface looks satisfactory. Using silver alginate Left heel continues to improve using silver collagen The bilateral dorsal foot areas which are at the base of his first and second toes/possible tinea pedis are actually stable on the left but worse on the right. The area on the left required debridement of necrotic surface. After debridement I obtained [name] specimen for PCR culture. The right dorsal foot which is been just about healed last week is now reopened 08/28/2018; culture done on the left dorsal foot showed coag negative staph both staph epidermidis and Lugdunensis. I think this is worthwhile initiating systemic treatment. I will use doxycycline given his long list of allergies. The area on the left heel slightly improved but still requiring debridement. The large wound on the buttock is just about closed whereas the smaller one is larger. Using silver alginate in this area 09/04/2018; patient is completing his doxycycline for the left foot although this continues to be [name] very difficult wound area with very adherent necrotic debris. We are using silver alginate to all his wounds right foot left foot and the small wounds on his buttock, silver collagen on the left heel. 09/11/2018;  once again this patient has intense erythema and swelling of the left forefoot. Lesser degrees of erythema in the right foot. He has [name] long list of allergies and intolerances. I will reinstitute doxycycline. 2 small areas on the left buttock are all the left of his [name] stage III pressure ulcer. Using silver alginate Left heel also looks better using silver collagen Unfortunately both the areas on his feet look worse. The area on the left first second webspace is now gone through to the plantar part of his foot. The area on the left foot anteriorly is irritated with erythema and swelling in the forefoot. 09/25/2018 His wound on the left plantar heel looks better. Using silver collagen The area on the left buttock 2 small remnant areas. One is closed one is still open. Using silver alginate The areas between both his first and second toes look worse. This in spite of long-standing antifungal therapy with ketoconazole and silver alginate which should have antifungal activity He has small areas around his original wound on the left calf one is on the bottom of the original scar tissue and one superiorly both of these are small and superficial but again given wound history in this site this is worrisome 10/02/2018 Left plantar heel continues to gradually contract using silver collagen Left buttock wound is unchanged using silver alginate The areas on his dorsal feet between his first and second toes bilaterally look about the same. I prescribed clindamycin ointment to see if we can address chronic staph colonization and also the underlying possibility of erythrasma The left lateral lower extremity wound is actually on the lateral part of his ankle. Small open area here. We have been using silver alginate 10/09/2018; Left plantar heel continues to look healthy and contract. No debridement is required Left buttock slightly smaller with [name] tape injury wound just below which was new this week Dorsal  feet somewhat improved I have been using clindamycin Left lateral looks lower extremity the actual open area looks worse although [name] lot of this is epithelialized. I  am going to change to silver collagen today He has [name] lot more swelling in the right leg although this is not pitting not red and not particularly warm there is [name] lot of spasm in the right leg usually indicative of people with paralysis of some underlying discomfort. We have reviewed his vascular status from 2017 he had [name] left greater saphenous vein ablation. I wonder about referring him back to vascular surgery if the area on the left leg continues to deteriorate. 10/16/2018 in today for follow-up and management of multiple lower extremity ulcers. His left Buttock wound is much lower smaller and almost closed completely. The wound to the left ankle has began to reopen with Epithelialization and some adherent slough. He has multiple new areas to the left foot and leg. The left dorsal foot without much improvement. Wound present between left great webspace and 2nd toe. Erythema and edema present right leg. Right LE [name], [name] ([id number]) [id number] Page 10 of 37 ultrasound obtained on 10/10/18 was negative for DVT. 10/23/2018; Left buttock is closed over. Still dry macerated skin but there is no open wound. I suspect this is chronic pressure/moisture Left lateral calf is quite [name] bit worse than when I saw this last. There is clearly drainage here he has macerated skin into the left plantar heel. We will change the primary dressing to alginate Left dorsal foot has some improvement in overall wound area. Still using clindamycin and silver alginate Right dorsal foot about the same as the left using clindamycin and silver alginate The erythema in the right leg has resolved. He is DVT rule out was negative Left heel pressure area required debridement although the wound is smaller and the surface is  health 10/26/2018 The patient came back in for his nurse check today predominantly because of the drainage coming out of the left lateral leg with [name] recent reopening of his original wound on the left lateral calf. He comes in today with [name] large amount of surrounding erythema around the wound extending from the calf into the ankle and even in the area on the dorsal foot. He is not systemically unwell. He is not febrile. Nevertheless this looks like cellulitis. We have been using silver alginate to the area. I changed him to [name] regular visit and I am going to prescribe him doxycycline. The rationale here is [name] long list of medication intolerances and [name] history of MRSA. I did not see anything that I thought would provide [name] valuable culture 10/30/2018 Follow-up from his appointment 4 days ago with really an extensive area of cellulitis in the left calf left lateral ankle and left dorsal foot. I put him on doxycycline. He has [name] long list of medication allergies which are true allergy reactions. 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 In any case he is [name] lot better today. The erythema especially in the anterior and lateral left calf is better. He still has left ankle erythema. He also is complaining about increasing edema in the right leg we have only been using Kerlix Coban and he has been doing the wraps at home. Finally he has [name] spotty rash on the medial part of his upper left calf which looks like folliculitis or perhaps wrap occlusion type injury. Small superficial macules not pustules 11/06/18 patient arrives today with again [name] considerable degree of erythema around the wound on the left lateral calf extending into the dorsal ankle and dorsal  foot. This is [name] lot worse than when I saw this last week. He is on doxycycline really with not [name] lot of improvement. He has not been systemically unwell Wounds on the; left heel actually looks  improved. Original area on the left foot and proximity to the first and second toes looks about the same. He has superficial areas on the dorsal foot, anterior calf and then the reopening of his original wound on the left lateral calf which looks about the same The only area he has on the right is the dorsal webspace first and second which is smaller. He has [name] large area of dry erythematous skin on the left buttock small open area here. 11/13/2018; the patient arrives in much better condition. The erythema around the wound on the left lateral calf is [name] lot better. Not sure whether this was the clindamycin or the TCA and ketoconazole or just in the improvement in edema control [stasis dermatitis]. In any case this is [name] lot better. The area on the left heel is very small and just about resolved using silver collagen we have been using silver alginate to the areas on his dorsal feet 11/20/2018; his wounds include the left lateral calf, left heel, dorsal aspects of both feet just proximal to the first second webspace. He is stable to slightly improved. I did not think any changes to his dressings were going to be necessary 11/27/2018 he has [name] reopening on the left buttock which is surrounded by what looks like tinea or perhaps some other form of dermatitis. The area on the left dorsal foot has some erythema around it I have marked this area but I am not sure whether this is cellulitis or not. Left heel is not closed. Left calf the reopening is really slightly longer and probably worse 1/13; in general things look better and smaller except for the left dorsal foot. Area on the left heel is just about closed, left buttock looks better only [name] small wound remains in the skin looks better [using Lotrisone] 1/20; the area on the left heel only has [name] few remaining open areas here. Left lateral calf about the same in terms of size, left dorsal foot slightly larger right lateral foot still not closed. The area on  the left buttock has no open wound and the surrounding skin looks [name] lot better 1/27; the area on the left heel is closed. Left lateral calf better but still requiring extensive debridements. The area on his left buttock is closed. He still has the open areas on the left dorsal foot which is slightly smaller in the right foot which is slightly expanded. We have been using Iodoflex on these areas as well 2/3; left heel is closed. Left lateral calf still requiring debridement using Iodoflex there is no open area on his left buttock however he has dry scaly skin over [name] large area of this. Not really responding well to the Lotrisone. Finally the areas on his dorsal feet at the level of the first second webspace are slightly smaller on the right and about the same on the left. Both of these vigorously debrided with Anasept and gauze 2/10; left heel remains closed he has dry erythematous skin over the left buttock but there is no open wound here. Left lateral leg has come in and with. Still requiring debridement we have been using Iodoflex here. Finally the area on the left dorsal foot and right dorsal foot are really about the same extremely dry callused  fissured areas. He does not yet have [name] dermatology appointment 2/17; left heel remains closed. He has [name] new open area on the left buttock. The area on the left lateral calf is bigger longer and still covered in necrotic debris. No [name] change in his foot areas bilaterally. I am awaiting for [name] dermatologist to look on this. We have been using ketoconazole I do not know that this is been doing any good at all. 2/24; left heel remains closed. The left buttock wound that was new reopening last week looks better. The left lateral calf appears better also although still requires debridement. The [name] area on his foot is the left first second also requiring debridement. We have been putting Prisma on all wounds. I do not believe that the ketoconazole has done too  much good for his feet. He will use Lotrisone I am going to give him [name] 2-week course of terbinafine. We still do not have [name] dermatology appointment 3/2 left heel remains closed however there is skin over bone in this area I pointed this out to him today. The left buttock wound is epithelialized but still does not look completely stable. The area on the left leg required debridement were using silver collagen here. With regards to his feet we changed to Lotrisone last week and silver alginate. 3/9; left heel remains closed. Left buttock remains closed. The area on the right foot is essentially closed. The left foot remains unchanged. Slightly smaller on the left lateral calf. Using silver collagen to both of these areas 3/16-Left heel remains closed. Area on right foot is closed. Left lateral calf above the lateral malleolus open wound requiring debridement with easy bleeding. Left dorsal wound proximal to first toe also debrided. Left ischial area open new. Patient has been using Prisma with wrapping every 3 days. Dermatology appointment is apparently tomorrow.Patient has completed his terbinafine 2-week course with some apparent improvement according to him, there is still flaking and dry skin in his foot on the left 3/23; area on the right foot is reopened. The area on the left anterior foot is about the same still [name] very necrotic adherent surface. He still has the area on the left leg and reopening is on the left buttock. He apparently saw dermatology although I do not have [name] note. According to the patient who is usually fairly well informed they did not have any good ideas. Put him on oral terbinafine which she is been on before. 3/30; using silver collagen to all wounds. Apparently his dermatologist put him on doxycycline and rifampin presumably some culture grew staph. I do not have this result. He remains on terbinafine although I have used terbinafine on him before 4/6; patient has had [name]  fairly substantial reopening on the right foot between the first and second toes. He is finished his terbinafine and I believe is on doxycycline and rifampin still as prescribed by dermatology. We have been using silver collagen to all his wounds although the patient reports that he thinks silver alginate does better on the wounds on his buttock. 4/13; the area on his left lateral calf about the same size but it did not require debridement. Left dorsal foot just proximal to the webspace between the first and second toes is about the same. Still nonviable surface. I note some superficial bronze discoloration of the dorsal part of his foot Right dorsal foot just proximal to the first and second toes also looks about the same. I still think there may  be the same discoloration I noted above on the left Left buttock wound looks about the same 4/20; left lateral calf appears to be gradually contracting using silver collagen. 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 The left dorsal foot wound is debrided of tightly adherent necrotic debris and really cleans up quite nicely. The right area is worse with expansion. I did not debride this it is now over the base of the second toe The area on his left buttock is smaller no debridement is required using silver collagen 5/4; left calf continues to make good progress. He arrives with erythema around the wounds on his dorsal foot which even extends to the plantar aspect. Very concerning for coexistent infection. He is finished the erythromycin I gave him for possible erythrasma this does not seem to have helped. The area on the left foot is about the same base of the dorsal toes Is area on the buttock looks improved on the left Briski, Allister [name] ([id number]) [id number] Page 11 of 37 5/11; left calf and left buttock continued to make good progress. Left foot is about the same to  slightly improved. [name] problem is on the right foot. He has not had an x-ray. Deep tissue culture I did last week showed both Enterobacter and [name]. coli. I did not change the doxycycline I put him on empirically although neither 1 of these were plated to doxycycline. He arrives today with the erythema looking worse on both the dorsal and plantar foot. Macerated skin on the bottom of the foot. he has not been systemically unwell 5/18-Patient returns at 1 week, left calf wound appears to be making some progress, left buttock wound appears slightly worse than last time, left foot wound looks slightly better, right foot redness is marginally better. X-ray of both feet show no air or evidence of osteomyelitis. Patient is finished his Omnicef and terbinafine. He continues to have macerated skin on the bottom of the left foot as well as right 5/26; left calf wound is better, left buttock wound appears to have multiple small superficial open areas with surrounding macerated skin. X-rays that I did last time showed no evidence of osteomyelitis in either foot. He is finished cefdinir and doxycycline. I do not think that he was on terbinafine. He continues to have [name] large superficial open area on the right foot anterior dorsal and slightly between the first and second toes. I did send him to dermatology 2 months ago or so wondering about whether they would do [name] fungal scraping. I do not believe they did but did do [name] culture. We have been using silver alginate to the toe areas, he has been using antifungals at home topically either ketoconazole or Lotrisone. We are using silver collagen on the left foot, silver alginate on the right, silver collagen on the left lateral leg and silver alginate on the left buttock 6/1; left buttock area is healed. We have the left dorsal foot, left lateral leg and right dorsal foot. We are using silver alginate to the areas on both feet and silver collagen to the area on his left  lateral calf 6/8; the left buttock apparently reopened late last week. He is not really sure how this happened. He is tolerating the terbinafine. Using silver alginate to all wounds 6/15; left buttock wound is larger than last week but still superficial. Came in the clinic today with [name] report of purulence from the left lateral leg I did not  identify any infection Both areas on his dorsal feet appear to be better. He is tolerating the terbinafine. Using silver alginate to all wounds 6/22; left buttock is about the same this week, left calf quite [name] bit better. His left foot is about the same however he comes in with erythema and warmth in the right forefoot once again. Culture that I gave him in the beginning of May showed Enterobacter and [name]. coli. I gave him doxycycline and things seem to improve although neither 1 of these organisms was specifically plated. 6/29; left buttock is larger and dry this week. Left lateral calf looks to me to be improved. Left dorsal foot also somewhat improved right foot completely unchanged. The erythema on the right foot is still present. He is completing the Ceftin dinner that I gave him empirically [see discussion above.) 7/6 - All wounds look to be stable and perhaps improved, the left buttock wound is slightly smaller, per patient bleeds easily, completed ceftin, the right foot redness is less, he is on terbinafine 7/13; left buttock wound about the same perhaps slightly narrower. Area on the left lateral leg continues to narrow. Left dorsal foot slightly smaller right foot about the same. We are using silver alginate on the right foot and Hydrofera Blue to the areas on the left. Unna boot on the left 2 layer compression on the right 7/20; left buttock wound absolutely the same. Area on lateral leg continues to get better. Left dorsal foot require debridement as did the right no [name] change in the 7/27; left buttock wound the same size necrotic debris over the  surface. The area on the lateral leg is closed once again. His left foot looks better right foot about the same although there is some involvement now of the posterior first second toe area. He is still on terbinafine which I have given him for [name] month, not certain [name] centimeter [name] change 06/25/19-All wounds appear to be slightly improved according to report, left buttock wound looks clean, both foot wounds have minimal to no debris the right dorsal foot has minimal slough. We are using Hydrofera Blue to the left and silver alginate to the right foot and ischial wound. 8/10-Wounds all appear to be around the same, the right forefoot distal part has some redness which was not there before, however the wound looks clean and small. Ischial wound looks about the same with no changes 8/17; his wound on the left lateral calf which was his original chronic venous insufficiency wound remains closed. Since I last saw him the areas on the left dorsal foot right dorsal foot generally appear better but require debridement. The area on his left initial tuberosity appears somewhat larger to me perhaps hyper granulated and bleeds very easily. We have been using Hydrofera Blue to the left dorsal foot and silver alginate to everything else 8/24; left lateral calf remains closed. The areas on his dorsal feet on the webspace of the first and second toes bilaterally both look better. The area on the left buttock which is the pressure ulcer stage II slightly smaller. I change the dressing to Hydrofera Blue to all areas 8/31; left lateral calf remains closed. The area on his dorsal feet bilaterally look better. Using Hydrofera Blue. Still requiring debridement on the left foot. No change in the left buttock pressure ulcers however 9/14; left lateral calf remains closed. Dorsal feet look quite [name] bit better than 2 weeks ago. Flaking dry skin also [name] lot better with the  ammonium lactate I gave him 2 weeks ago. The area on the  left buttock is improved. He states that his Roho cushion developed [name] leak and he is getting [name] new one, in the interim he is offloading this vigorously 9/21; left calf remains closed. Left heel which was [name] possible DTI looks better this week. He had macerated tissue around the left dorsal foot right foot looks satisfactory and improved left buttock wound. I changed his dressings to his feet to silver alginate bilaterally. Continuing Hydrofera Blue on the left buttock. 9/28 left calf remains closed. Left heel did not develop anything [possible DTI] dry flaking skin on the left dorsal foot. Right foot looks satisfactory. Improved left buttock wound. We are using silver alginate on his feet Hydrofera Blue on the buttock. I have asked him to go back to the Lotrisone on his feet including the wounds and surrounding areas 10/5; left calf remains closed. The areas on the left and right feet about the same. [name] lot of this is epithelialized however debris over the remaining open areas. He is using Lotrisone and silver alginate. The area on the left buttock using Hydrofera Blue 10/26. Patient has been out for 3 weeks secondary to Covid concerns. He tested negative but I think his wife tested positive. He comes in today with the left foot substantially worse, right foot about the same. Even more concerning he states that the area on his left buttock closed over but then reopened and is considerably deeper in one aspect than it was before [stage III wound] 11/2; left foot really about the same as last week. Quarter sized wound on the dorsal foot just proximal to the first second toes. Surrounding erythema with areas of denuded epithelium. This is not really much different looking. Did not look like cellulitis this time however. Right foot area about the same.. We have been using silver alginate alginate on his toes Left buttock still substantial irritated skin around the wound which I think looks somewhat better.  We have been using Hydrofera Blue here. 11/9; left foot larger than last week and [name] very necrotic surface. Right foot I think is about the same perhaps slightly smaller. Debris around the circumference also addressed. Unfortunately on the left buttock there is been [name] decline. Satellite lesions below the [name] wound distally and now [name] an additional one posteriorly we have been using Hydrofera Blue but I think this is [name] pressure issue 11/16; left foot ulcer dorsally again [name] very adherent necrotic surface. Right foot is about the same. Not much change in the pressure ulcer on his left buttock. 11/30; left foot ulcer dorsally basically the same as when I saw him 2 weeks ago. Very adherent fibrinous debris on the wound surface. Patient reports [name] lot of drainage as well. 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 We have been using Iodoflex, patient changed back to alginate because of the drainage. Area on his right dorsal foot really looks benign with [name] healthier surface certainly [name] lot better than on the left. Left buttock wounds all improved using Hydrofera Blue 12/7; left dorsal foot again no improvement. Tightly adherent debris. PCR culture I did last week only showed likely skin contaminant. I have gone ahead and done [name] punch biopsy of this which is about the last thing in terms of investigations I can think to do. He has known venous insufficiency and venous hypertension and this could be the issue here. The area  on the right foot is about the same left buttock slightly worse according to our intake nurse secondary to [facility] Blue sticking to the wound 12/14; biopsy of the left foot that I did last time showed changes that could be related to wound healing/chronic stasis dermatitis phenomenon no neoplasm. We have been using silver alginate to both feet. I change the one on the left today to Sorbact and silver alginate to his other 2 wounds 12/28; the  patient arrives with the following problems; [name] issue is the dorsal left foot which continues to be [name] larger deeper wound area. Still with [name] completely nonviable surface Paradoxically the area mirror image on the right on the right dorsal foot appears to be getting better. He had some loss of dry denuded skin from the lower part of his original wound on the left lateral calf. Some of this area looked [name] little vulnerable and for this reason we put him in wrap that on this side this week The area on his left buttock is larger. He still has the erythematous circular area which I think is [name] combination of pressure, sweat. This does not look like cellulitis or fungal dermatitis 11/26/2019; -Dorsal left foot large open wound with depth. Still debris over the surface. Using Sorbact Cantero, Kavaughn [name] ([id number]) [id number] Page 12 of 37 The area on the dorsal right foot paradoxically has closed over He has [name] reopening on the left ankle laterally at the base of his original wound that extended up into the calf. This appears clean. The left buttock wound is smaller but with very adherent necrotic debris over the surface. We have been using silver alginate here as well The patient had arterial studies done in 2017. He had biphasic waveforms at the dorsalis pedis and posterior tibial bilaterally. ABI in the left was 1.17. Digit waveforms were dampened. He has slight spasticity in the great toes I do not think [name] TBI would be possible 1/11; the patient comes in today with [name] sizable reopening between the first and second toes on the right. This is not exactly in the same location where we have been treating wounds previously. According to our intake nurse this was actually fairly deep but 0.6 cm. The area on the left dorsal foot looks about the same the surface is somewhat cleaner using Sorbact, his MRI is in 2 days. We have not managed yet to get arterial studies. The new reopening on  the left lateral calf looks somewhat better using alginate. The left buttock wound is about the same using alginate 1/18; the patient had his ARTERIAL studies which were quite normal. ABI in the right at 1.13 with triphasic/biphasic waveforms on the left ABI 1.06 again with triphasic/biphasic waveforms. It would not have been possible to have done [name] toe brachial index because of spasticity. We have been using Sorbac to the left foot alginate to the rest of his wounds on the right foot left lateral calf and left buttock 1/25; arrives in clinic with erythema and swelling of the left forefoot worse over the first MTP area. This extends laterally dorsally and but also posteriorly. Still has an area on the left lateral part of the lower part of his calf wound it is eschared and clearly not closed. Area on the left buttock still with surrounding irritation and erythema. Right foot surface wound dorsally. The area between the right and first and second toes appears better. 2/1; The left foot wound is about the same. Erythema slightly better  I gave him [name] week of doxycycline empirically Right foot wound is more extensive extending between the toes to the plantar surface Left lateral calf really no open surface on the inferior part of his original wound however the entire area still looks vulnerable Absolutely no improvement in the left buttock wound required debridement. 2/8; the left foot is about the same. Erythema is slightly improved I gave him clindamycin last week. Right foot looks better he is using Lotrimin and silver alginate He has [name] breakdown in the left lateral calf. Denuded epithelium which I have removed Left buttock about the same were using Hydrofera Blue 2/15; left foot is about the same there is less surrounding erythema. Surface still has tightly adherent debris which I have debriding however not making any progress Right foot has [name] substantial wound on the medial right second toe  between the first and second webspace. Still an open area on the left lateral calf distal area. Buttock wound is about the same 2/22; left foot is about the same less surrounding erythema. Surface has adherent debris. Polymen Ag Right foot area significant wound between the first and second toes. We have been using silver alginate here Left lateral leg polymen Ag at the base of his original venous insufficiency wound Left buttock some improvement here 3/1; Right foot is deteriorating in the first second toe webspace. Larger and more substantial. We have been using silver alginate. Left dorsal foot about the same markedly adherent surface debris using PolyMem Ag Left lateral calf surface debris using PolyMem AG Left buttock is improved again using PolyMem Ag. He is completing his terbinafine. The erythema in the foot seems better. He has been on this for 2 weeks 3/8; no improvement in any wound area in fact he has [name] small open area on the dorsal midfoot which is new this week. He has not gotten his foot x-rays yet 3/15; his x-rays were both negative for osteomyelitis of both feet. No [name] change in any of his wounds on the extremities however his buttock wounds are better. We have been using polymen on the buttocks, left lower leg. Iodoflex on the left foot and silver alginate on the right 3/22; arrives in clinic today with the 2 [name] issues are the improvement in the left dorsal foot wound which for once actually looks healthy with [name] nice healthy wound surface without debridement. Using Iodoflex here. Unfortunately on the left lateral calf which is in the distal part of his original wound he came to the clinic here for there was purulent drainage noted some increased breakdown scattered around the original area and [name] small area proximally. We we are using polymen here will change to silver alginate today. His buttock wound on the left is better and I think the area on the right first second toe  webspace is also improved 3/29; left dorsal foot looks better. Using Iodoflex. Left ankle culture from deterioration last time grew [name]. coli, Enterobacter and Enterococcus. I will give him [name] course of cefdinir although that will not cover Enterococcus. The area on the right foot in the webspace of the first and second toe lateral first toe looks better. The area on his buttock is about healed Vascular appointment is on April 21. This is to look at his venous system vis--vis continued breakdown of the wounds on the left including the left lateral leg and left dorsal foot he. He has had previous ablations on this side 4/5; the area between the right first and  second toes lateral aspect of the first toe looks better. Dorsal aspect of the left first toe on the left foot also improved. Unfortunately the left lateral lower leg is larger and there is [name] second satellite wound superiorly. The usual superficial abrasions on the left buttock overall better but certainly not closed 4/12; the area between the right first and second toes is improved. Dorsal aspect of the left foot also slightly smaller with [name] vibrant healthy looking surface. No real change in the left lateral leg and the left buttock wound is healed He has an unaffordable co-pay for Apligraf. Appointment with vein and vascular with regards to the left leg venous part of the circulation is on 4/21 4/19; we continue to see improvement in all wound areas. Although this is minor. He has his vascular appointment on 4/21. The area on the left buttock has not reopened although right in the center of this area the skin looks somewhat threatened 4/26; the left buttock is unfortunately reopened. In general his left dorsal foot has [name] healthy surface and looks somewhat smaller although it was not measured as such. The area between his first and second toe webspace on the right as [name] small wound against the first toe. The patient saw vascular surgery. The real  question I was asking was about the small saphenous vein on the left. He has previously ablated left greater saphenous vein. Nothing further was commented on on the left. Right greater saphenous vein without reflux at the saphenofemoral junction or proximal thigh there was no indication for ablation of the right greater saphenous vein duplex was negative for DVT bilaterally. They did not think there was anything from [name] vascular surgery point of view that could be offered. They ABIs within normal limits 5/3; only small open area on the left buttock. The area on the left lateral leg which was his original venous reflux is now 2 wounds both which look clean. We are using Iodoflex on the left dorsal foot which looks healthy and smaller. He is down to [name] very tiny area between the right first and second toes, using silver alginate 5/10; all of his wounds appear better. We have much better edema control in 4 layer compression on the left. This may be the factor that is allowing the left foot and left lateral calf to heal. He has external compression garments at home 04/14/20-All of his wounds are progressing well, the left forefoot is practically closed, left ischium appears to be about the same, right toe webspace is also smaller. The left lateral leg is about the same, continue using Hydrofera Blue to this, silver alginate to the ischium, Iodoflex to the toe space on the right 6/7; most of his wounds outside of the left buttock are doing well. The area on the left lateral calf and left dorsal foot are smaller. The area on the right foot in between the first and second toe webspace is barely visible although he still says there is some drainage here is the only reason I did not heal this out. Unfortunately the area on the left buttock almost looks like he has [name] skin tear from tape. He has open wound and then [name] large flap of skin that we are trying to get adherence over an area just next to the remaining  wound 6/21; 2 week follow-up. I believe is been here for nurse visits. Miraculously the area between his first and second toes on the left dorsal foot is closed over. Still  open on the right first second web space. The left lateral calf has 2 open areas. Distally this is more superficial. The proximal area had [name] little more depth and required debridement of adherent necrotic material. His buttock wound is actually larger we have been using silver alginate here 6/28; the patient's area on the left foot remains closed. Still open wet area between the first and second toes on the right and also extending into the plantar aspect. We have been using silver alginate in this location. He has 2 areas on the left lower leg part of his original long wounds which I think are better. We have been using Hydrofera Blue here. Hydrofera Blue to the left buttock which is stable 7/12; left foot remains closed. Left ankle is closed. May be [name] small area between his right first and second toes the only truly open area is on the left buttock. We have been using Hydrofera Blue here Nevils, Taelyn [name] ([id number]) [phone].pdf Page 13 of 37 7/19; patient arrives with marked deterioration especially in the left foot and ankle. We did not put him in [name] compression wrap on the left last week in fact he wore his juxta lite stockings on either side although he does not have an underlying stocking. He has [name] reopening on the left dorsal foot, left lateral ankle and [name] new area on the right dorsal ankle. More worrisome is the degree of erythema on the left foot extending on the lateral foot into the lateral lower leg on the left 7/26; the patient had erythema and drainage from the lateral left ankle last week. Culture of this grew MRSA resistant to doxycycline and clindamycin which are the 2 antibiotics we usually use with this patient who has multiple antibiotic allergies including linezolid, trimethoprim  sulfamethoxazole. I had give him an empiric doxycycline and he comes in the area certainly looks somewhat better although it is blotchy in his lower leg. He has not been systemically unwell. He has had areas on the left dorsal foot which is [name] reopening, chronic wounds on the left lateral ankle. Both of these I think are secondary to chronic venous insufficiency. The area between his first and second toes is closed as far as I can tell. He had [name] new wrap injury on the right dorsal ankle last week. Finally he has an area on the left buttock. We have been using silver alginate to everything except the left buttock we are using Hydrofera Blue 06/30/20-Patient returns at 1 week, has been given [name] sample dose pack of NUZYRA which is [name] tetracycline derivative [omadacycline], patient has completed those, we have been using silver alginate to almost all the wounds except the left ischium where we are using Hydrofera Blue all of them look better 8/16; since I last saw the patient he has been doing well. The area on the left buttock, left lateral ankle and left foot are all closed today. He has completed the [place] I gave him last time and tolerated this well. He still has open areas on the right dorsal ankle and in the right first second toe area which we are using silver alginate. 8/23; we put him in his bilateral external compression stockings last week as he did not have anything open on either leg except for concerning area between the right first and second toe. He comes in today with an area on the left dorsal foot slightly more proximal than the original wound, the left lateral foot but this is actually  [name] continuation of the area he had on the left lateral ankle from last time. As well he is opened up on the left buttock again. 8/30; comes in today with things looking [name] lot better. The area on the left lower ankle has closed down as has the left foot but with eschar in both areas. The area on the dorsal  right ankle is also epithelialized. Very little remaining of the left buttock wound. We have been using silver alginate on all wound areas 9/13; the area in the first second toe webspace on the right has fully epithelialized. He still has some vulnerable epithelium on the right and the ankle and the dorsal foot. He notes weeping. He is using his juxta lite stocking. On the left again the left dorsal foot is closed left lateral ankle is closed. We went to the juxta lite stocking here as well. Still vulnerable in the left buttock although only 2 small open areas remain here 9/27; 2-week follow-up. We did not look at his left leg but the patient says everything is closed. He is [name] bit disturbed by the amount of edema in his left foot he is using juxta lite stockings but asking about over the toes stockings which would be 30/40, will talk to him next time. According to him there is no open wound on either the left foot or the left ankle/calf He has an open area on the dorsal right calf which I initially point [name] wrap injury. He has superficial remaining wound on the left ischial tuberosity been using silver alginate although he says this sticks to the wound 10/5; we gave him 2-week follow-up but he called yesterday expressing some concerns about his right foot right ankle and the left buttock. He came in early. There is still no open areas on the left leg and that still in his juxta lite stocking 10/11; he only has 1 small area on the left buttock that remains measuring millimeters 1 mm. Still has the same irritated skin in this area. We recommended zinc oxide when this eventually closes and pressure relief is meticulously is he can do this. He still has an area on the dorsal part of his right first through third toes which is [name] bit irritated and still open and on the dorsal ankle near the crease of the ankle. We have been using silver alginate and using his own stocking. He has nothing open on the left leg  or foot 10/25; 2-week follow-up. Not nearly as good on the left buttock as I was hoping. For open areas with 5 looking threatened small. He has the erythematous irritated chronic skin in this area. 1 area on the right dorsal ankle. He reports this area bleeds easily Right dorsal foot just proximal to the base of his toes We have been using silver alginate. 11/8; 2-week follow-up. Left buttock is about the same although I do not think the wounds are in the same location we have been using silver alginate. I have asked him to use zinc oxide on the skin around the wounds. He still has [name] small area on the right dorsal ankle he reports this bleeds easily Right dorsal foot just proximal to the base of the toes does not have anything open although the skin is very dry and scaly He has [name] new opening on the nailbed of the left great toe. Nothing on the left ankle 11/29; 3-week follow-up. Left buttock has 2 open areas. And washing of these wounds today  started bleeding easily. Suggesting very friable tissue. We have been using silver alginate. Right dorsal ankle which I thought was initially [name] wrap injury we have been using silver alginate. Nothing open between the toes that I can see. He states the area on the left dorsal toe nailbed healed after the last visit in 2 or 3 days 12/13; 3-week follow-up. His left buttock now has 3 open areas but the original 2 areas are smaller using polymen here. Surrounding skin looks better. The right dorsal ankle is closed. He has [name] small opening on the right dorsal foot at the level of the third toe. In general the skin looks better here. He is wearing his juxta lite stocking on the left leg says there is nothing open 11/24/2020; 3 weeks follow-up. His left buttock still has the 3 open areas. We have been using polymen but due to lack of response he changed to [facility] area. Surrounding skin is dry erythematous and irritated looking. There is no evidence of infection  either bacterial or fungal however there is loss of surface epithelium He still has very dry skin in his foot causing irritation and erythema on the dorsal part of his toes. This is not responded to prolonged courses of antifungal simply looks dry and irritated 1/24; left buttock area still looks about the same he was unable to find the triad ointment that we had suggested. The area on the right lower leg just above the dorsal ankle has reopened and the areas on the right foot between the first second and second third toes and scaling on the bottom of the foot has been about the same for quite some time now. been using silver alginate to all wound areas 2/7; left buttock wound looked quite good although not much smaller in terms of surface area surrounding skin looks better. Only [name] few dry flaking areas on the right foot in between the first and second toes the skin generally looks better here [ammonium lactate]. Finally the area on the right dorsal ankle is closed 2/21; There is no open area on the right foot even between the right first and second toe. Skin around this area dorsally and plantar aspects look better. He has [name] reopening of the area on the right ankle just above the crease of the ankle dorsally. I continue to think that this is probably friction from spasms may be even this time with his stocking under the compression stockings. Wounds on his left buttock look about the same there [name] couple of areas that have reopened. He has [name] total square area of loss of epithelialization. This does not look like infection it looks like [name] contact dermatitis but I just cannot determine to what 3/14; there is nothing on the right foot between the first and second toes this was carefully inspected under illumination. Some chronic irritation on the dorsal part of his foot from toes 1-3 at the base. Nothing really open here substantially. Still has an area on the right foot/ankle that is actually larger  and hyper granulated. His buttock area on the left is just about closed however he has chronic inflammation with loss of the surface epithelial layer 3/28; 2-week follow-up. In clinic today with [name] new wound on the left anterior mid tibia. Says this happened about 2 weeks ago. He is not really sure how wonders about the spasticity of his legs at night whether that could have caused this other than that he does not have [name] good idea.  He has been using topical antibiotics and silver alginate. The area on his right dorsal ankle seems somewhat better. Finally everything on his left buttock is closed. 4/11; 2-week follow-up. All of his wounds are better except for the area over the ischium and left buttock which have opened up widely again. At least part of this is covered in necrotic fibrinous material another part had rolled nonviable skin. The area on the right ankle, left anterior mid tibia are both [name] lot better. He had no open wounds on either foot including the areas between the first and second toes 4/25; patient presents for 2-week follow-up. He states that the wounds are overall stable. He has no complaints today and states he is using Hydrofera Blue to open wounds. 5/9; have not seen this man in over [name] month. For my memory he has open areas on the left mid tibia and right ankle. T oday he has new open area on the right dorsal foot which we have not had [name] problem with recently. He has the sustained area on the left buttock He is also changed his insurance at the beginning of the year [employer]. We will need prior authorizations for debridement 5/23; patient presents for 2-week follow-up. He has prior authorizations for debridement. He denies any issues in the past 2 weeks with his wound care. He has been using Hydrofera Blue to all the wounds. He does report [name] circular rash to the upper left leg that is new. He denies acute signs of infection. 6/6; 2-week follow-up. The patient has open wounds  on the left buttock which are worse than the last time I saw this about [name] month ago. He also has [name] new area to me on the left anterior mid tibia with some surrounding erythema. The area on the dorsal ankle on the right is closed but I think this will be [name] friction injury Higdon, Kehinde [name] ([id number]) [id number] Page 14 of 37 every time this area is exposed to either our wraps or his compression stockings caused by unrelenting spasms in this leg. 6/20; 2-week follow-up. The patient has open wounds on the left buttock which is about the same. Using [facility] here. - The left mid tibia has [name] static amount of surrounding erythema. Also [name] raised area in the center. We have been using Hydrofera Blue here. Finally he has broken down in his dorsal right foot extending between the first and second toes and going to the base of the first and second toe webspace. I have previously assumed that this was severe venous hypertension 7/5; 2-week follow-up The left buttock wound actually looks better. We are using Hydrofera Blue. He has extensive skin irritation around this area and I have not really been able to get that any better. I have tried Lotrisone i.[name]. antifungals and steroids. More most recently we have just been using Coloplast really looks about the same. The left mid tibia which was new last week culture to have very resistant staph aureus. Not only methicillin-resistant but doxycycline resistant. The patient has [name] plethora of antibiotic allergies including sulfa, linezolid. I used topical bacitracin on this but he has not started this yet. In addition he has an expanding area of erythema with [name] wound on the dorsal right foot. I did [name] deep tissue culture of this area today 7/12; Left buttock area actually looks better surrounding skin also looks less irritated. Left mid tibia looks about the same. He is using bacitracin this is not worse  Right dorsal foot looks about the  same as well. The left first toe also looks about the same 7/19; left buttock wound continues to improve in terms of open areas Left mid tibia is still concerning amount of swelling he is using bacitracin Dorsal left first toe somewhat smaller Right dorsal foot somewhat smaller 7/25; left buttock wound actually continues to improve Left mid tibia area has less swelling. I gave him all my samples of new Nuzyra. This seems to have helped although the wound is still open it. His abrasion closed by here Left dorsal great toe really no better. Still [name] very nonviable surface Right dorsal foot perhaps some better. We have been using bacitracin and silver nitrate to the areas on his lower legs and Hydrofera Blue to the area on the buttock. 8/16 Disappointed that his left buttock wound is actually more substantial. Apparently during the last nurse visit these were both very small. He has continued irritation to [name] large area of skin on his buttock. I have never been able to totally explain this although I think it some combination of the way he sits, pressure, moisture. He is not incontinent enough to contribute to this. Left dorsal great toe still fibrinous debris on the surface that I have debrided today Large area across the dorsal right toes. The area on the left anterior mid tibia has less swelling. He completed the [place]. This does not look infected although the tissue is still fried 8/30; 2-week follow-up. Left buttock areas not improved. We used Hydrofera Blue on this. Weeping wet with the surrounding erythema that I have not been able to control even with Lotrisone and topical Coloplast Left dorsal great toe looks about the same More substantial area again at the base of his toes on the left which is new this week. Area across the dorsal right toes looks improved The left anterior mid tibia looks like it is trying to close 9/13; 2-week follow-up. Using silver alginate on all of his wounds.  The left dorsal foot does not look any better. He has the area on the dorsal toe and also the areas at the base of all of his toes 1 through 3. On the right foot he has [name] similar pattern in [name] similar area. He has the area on his left mid tibia that looks fairly healthy. Finally the area on his left buttock looks somewhat bette 9/20; culture I did of the left foot which was [name] deep tissue culture last time showed [name]. coli he has erythema around this wound. Still [name] completely necrotic surface. His right dorsal foot looks about the same. He has [name] very friable surface to the left anterior mid tibia. Both buttock wounds look better. We have been using silver alginate to all wounds 10/4; he has completed the cephalexin that I gave him last time for the left foot. He is using topical gentamicin under silver alginate silver alginate being applied to all the wounds. Unfortunately all the wounds look irritated on his dorsal right foot dorsal left foot left mid tibia. I wonder if this could be [name] silver allergy. I am going to change him to [facility] on the lower extremity. The skin on the left buttock and left posterior thigh still flaking dry and irritated. This has continued no matter what I have applied topically to this. He has [name] solitary open wound which by itself does not look too bad however the entire area of surrounding skin does not change no matter  what we have applied here 10/18; the area on the left dorsal foot and right dorsal foot both look better. The area on the right extends into the plantar but not between his toes. We have been using silver alginate. He still has [name] rectangular erythematous area around the area on the left tibia. The wound itself is very small. Finally everything on his left buttock looks [name] little larger the skin is erythematous 11/15; patient comes in with the left dorsal and right dorsal foot distally looking somewhat better. Still nonviable surface on the left foot which  required debridement. He still has the area on the left anterior mid tibia although this looks somewhat better. He has [name] new area on the right lateral lower leg just above the ankle. Finally his left buttock looks terrible with multiple superficial open wounds geometric square shaped area of chronic erythema which I have not been able to sort through 11/29; right dorsal foot and left dorsal foot both look somewhat better. No debridement required. He has the fragile area on the left anterior mid tibia this looks and continues to look somewhat better. Right lateral lower leg just above the ankle we identified last time also looks better. In general the area on his left buttock looks improved. We are using Hydrofera Blue to all wound areas 12/13; right dorsal foot looks better. The area on the right lateral leg is healed. Left dorsal foot has 2 open areas both of which require debridement. The fragile area on the left anterior mid tibial looks better. Smaller area on his buttocks. Were using Hydrofera Blue 1/10; patient comes in with everything looking slightly larger and/or worse. This includes his left buttock, reopening of the left mid tibia, larger areas on the left dorsal foot and what looks to be [name] cellulitis on the right dorsal and plantar foot. We have been using Hydrofera Blue on all wounds. 1/17; right dorsal foot distally looks better today. The left foot has 2 open wounds that are about the same surrounding erythema. 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 No malignancy they did not stain for fungus 1/24; his right distal foot is not closed dry and scaly but the wound looks like it is contracting. I did not debride anything here. Problem on his left dorsal foot with expanding erythema. Apparently there were problems last week getting the Elesa Hacker however it  is now available at the [employer] but [name] week later. He is using ketoconazole and Coloplast to the left buttock along with Hydrofera Blue this actually looks quite [name] bit better today. 1/31; right dorsal foot again is dry and scaly but looking to contract. He has been using [name] moisturizer on his feet at my request but he is not sure which 1. The left dorsal foot wounds look about the same there is erythema here that I marked last week however after course of Nuzyra it certainly is not any better but not any worse either. Finally on his left buttock the skin continues to look better he has the original wound but [name] new substantial area towards the gluteal cleft. Almost like [name] skin tear. I used scissors to remove skin and subcutaneous tissue here silver nitrate and direct pressure 2/7; right dorsal foot. This does not look too much different from last week. Some erythema skin dry and scaly. No debridement. Left dorsal great toe again still  not much improvement. I did remove flaking dry skin and callus from around the edge. Finally on his left buttock. The skin is somewhat better in the periwound. Surface wounds are superficial somewhat better than last week. Gunkel, TYREKE KAESER ([id number]) [id number] Page 15 of 37 01/26/2022: Is [name] little bit of [name] mystery as to why his wounds fail to respond to treatments and actually seem to get worse. This is my first encounter with this patient. He was previously followed by Dr. Dellia Nims. Based upon my review of the chart, it seems that there is [name] little bit of [name] mystery as to why his wounds do not respond as anticipated to the interventions applied and sometimes even get worse. Biopsies have been performed and he was seen by dermatology in [place], but that did not shed any light on the matter. T oday, his gluteal wound is larger, with substantial drainage, rather malodorous. The food wounds are not terrible, but he has [name] lot of callus and  scaly skin around these. He is currently getting silver alginate on the gluteal wound, with idodoflex to the feet. He is using lotrisone on his legs for the dry, scaly skin. 02/09/2022: There has really been no change to any of his wounds. The gluteal wound less drainage and odor, but remains about the same size, the periwound skin remains oddly scaly. His lower extremity wounds also appear roughly the same size. They continue to accumulate [name] small amount of slough. The periwound on his feet and ankle wounds has dry eschar and loose dead skin. We have been using silver alginate on the gluteal wound and Iodoflex on his feet and ankle wounds. T the periwound around his gluteal lesion and Lotrisone on his feet and legs. o 02/23/2022: The right plantar foot wound is closed. The gluteal site looks small but has continued to produce hypertrophic granulation tissue. The foot wounds all look about the same on the dorsal surface of the right foot; on the left, there is only [name] small open area at the site of where his left great toenail would have been. 03/16/2022: The right ankle wound is healed. The right dorsal foot wound is about the same. The left dorsal foot wound is quite [name] bit smaller and the ischial wound is nearly closed. 03/30/2022: The right ankle wound reopened. Both dorsal foot wounds are quite [name] bit smaller. Unfortunately, he appears to have sheared part of his ischial wound open further, perhaps during [name] transfer. 04/13/2022: The right ankle wound has hypertrophic granulation tissue present. The dorsal foot wounds continue to decrease in size. The ischial wound looks about the same today, no better, no worse. 04/27/2022: The right ankle wound has closed. Unfortunately, it looks like some moisture got underneath the dry skin on both of his dorsal feet and these wounds have expanded in size. The ischial wound remains the same with perhaps [name] little bit more slough accumulation than at our previous  visit. 05/11/2022: The right ankle wound remains closed. There is [name] left anterior tibial wound that is small has patchy openings with accumulated slough. The dorsum of his right foot appears to be nearly healed with just [name] small punctate opening. The plantar surface of his right foot has [name] new opening that looks like he may have picked some skin there. His sacral ulcer has hypertrophic granulation tissue but has some slough accumulation. The dorsum of his left foot has multiple open areas in [name] fairly ragged distribution. All of these have slough accumulated  within them. 06/01/2022: The right ankle and left anterior tibial wound are both closed. Dorsum of his right foot and left foot both look substantially better with just tiny scattered openings The without any slough accumulation. He has sheared open new areas on his left gluteus and ischium. He says that his wheelchair cushion, which is air-filled, has [name] leak and so it keeps deflating. He is awaiting [name] new cushion. 06/15/2022: The right ankle wound has reopened and the fat layer is exposed. Both dorsal feet have just small openings with just [name] little bit of slough and eschar accumulation. The wound on his left gluteus and ischium is larger again today and has [name] foul odor. 06/29/2022: The right ankle wound has hypertrophic granulation tissue buildup. His dorsal foot wounds both look better with just some eschar on the surface. He has [name] new wound on his left lateral ankle. He is not sure how he acquired it but by appearance, it looks that he hit it on something, potentially his wheelchair or bed. The ischial wound is about the same but is cleaner without any significant purulent drainage or odor. He did not understand what the [facility] call was about and therefore he does not have the topical compounded antibiotic. 07/13/2022: The right ankle wound again has hypertrophic granulation tissue, but less so than at his previous visit. The ischial wound has  improved tremendously the use of the [facility] topical antibiotic. No significant change to the left lateral ankle wound; it is fibrotic with slough present. The skin of both of his feet, especially on the right, has [name] yeasty appearance. 08/10/2022: There is again hypertrophic granulation tissue on the right anterior ankle wound. Both feet are about the same. The left lateral ankle wound is [name] little bit desiccated and has some slough buildup. He unfortunately suffered [name] new injury when he was removing his pants and they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 right second toe is improved also the left foot wound looks stable to improved 08/16/16; the area on the last lateral leg did not change any of dimensions. Post debridement with [name] curet the area looked better. Left foot wound improved and the area on the dorsal right second toe is improved 08/23/16; the area on the left lateral leg may be slightly smaller both in terms of length and width. Aggressive debridement with [name] curette afterwards the tissue appears healthier. Left foot wound appears improved in the area on the dorsal right second toe is improved 08/30/16 patient developed [name] fever over the weekend and was seen in an urgent care. Felt to have [name] UTI and put on doxycycline. He has been since changed over the phone to [facility]. After we took off the wrap on his right leg today the leg is swollen  warm and erythematous, probably more likely the source of the fever 09/06/16; have been using collagen to the [name] left leg wound, silver alginate to the area on his anterior  foot/toes 09/13/16; the areas on his anterior foot/toes on both sides appear to be virtually closed. Extensive wound on the left lateral leg perhaps slightly narrower but each visit still covered an adherent surface slough 09/16/16 patient was in for his usual Thursday nurse visit however the intake nurse noted significant erythema of his dorsal right foot. He is also running [name] low- grade fever and having increasing spasms in the right leg 09/20/16 here for cellulitis involving his right great toes and forefoot. This is [name] lot better. Still requiring debridement on his left lateral leg. Santyl direct says he needs prior authorization. Therefore his wife cannot change this at home 09/30/16; the patient's extensive area on the left lateral calf and ankle perhaps somewhat better. Using Santyl. The area on the left toes is healed and I think the area on his right dorsal foot is healed as well. There is no cellulitis or venous inflammation involving the right leg. He is going to need compression stockings here. 10/07/16; the patient's extensive wound on the left lateral calf and ankle does not measure any differently however there appears to be less adherent surface slough using Santyl and aggressive weekly debridements 10/21/16; no [name] change in the area on the left lateral calf. Still the same measurement still very difficult to debridement adherent slough and nonviable subcutaneous tissue. This is not really been helped by several weeks of Santyl. Previously for 2 weeks I used Iodoflex for [name] short period. [name] prolonged course of Hydrofera Blue didn't really help. I'm not sure why I only used 2 weeks of Iodoflex on this there is no evidence of surrounding infection. He has [name] small area on the right second toe which looks as though  it's progressing towards closure 10/28/16; the wounds on his toes appear to be closed. No [name] change in the left lateral leg wound although the surface looks somewhat better using Iodoflex. He has had previous arterial studies that were normal. He has had reflux studies and is status post ablation although I don't have any exact notes on which vein was ablated. I'll need to check the surgical record 11/04/16; he's had [name] reopening between the first and second toe on the left and right. No [name] change in the left lateral leg wound. There is what appears to be cellulitis of the left dorsal foot 11/18/16 the patient was hospitalized initially in [place] and then subsequently transferred to [facility] and was admitted there from 11/09/16 through 11/12/16. He had developed progressive cellulitis on the right leg in spite of the doxycycline I gave him. I'd spoken to the hospitalist in [place] who was concerned about continuing leukocytosis. CT scan is what I suggested this was done which showed soft tissue swelling without evidence of osteomyelitis or an underlying abscess blood cultures were negative. At [facility] he was treated with vancomycin and Primaxin and then add an infectious disease consult. He was transitioned to Ceftaroline. He has been making progressive improvement. Overall [name] severe cellulitis of the right leg. He is been using silver alginate to her original wound on the left leg. The wounds in his toes on the right are closed there is [name] small open area on the base of the left second toe 11/26/15; the patient's right leg is much better although there is still some edema here this could be reminiscent from his severe cellulitis likely on top of some degree of lymphedema. His left anterior leg wound has less surface slough as reported by her intake nurse.  Small wound at the base of the left second toe 12/02/16; patient's right leg is better and there is no open wound here. His left anterior  lateral leg wound continues to have [name] healthy-looking surface. Small wound at the base of the left second toe however there is erythema in the left forefoot which is worrisome 12/16/16; is no open wounds on his right leg. We took measurements for stockings. His left anterior lateral leg wound continues to have [name] healthy-looking surface. I'm not sure where we were with the Apligraf run through his insurance. We have been using Iodoflex. He has [name] thick eschar on the left first second toe interface, I suspect this may be fungal however there is no visible open 12/23/16; no open wound on his right leg. He has 2 small areas left of the linear wound that was remaining last week. We have been using Prisma, I thought I have disclosed this week, we can only look forward to next week 01/03/17; the patient had concerning areas of erythema last week, already on doxycycline for UTI through his primary doctor. The erythema is absolutely no better there is warmth and swelling both medially from the left lateral leg wound and also the dorsal left foot. 01/06/17- Patient is here for follow-up evaluation of his left lateral leg ulcer and bilateral feet ulcers. He is on oral antibiotic therapy, tolerating that. Nursing staff and the patient states that the erythema is improved from Monday. 01/13/17; the predominant left lateral leg wound continues to be problematic. I had put Apligraf on him earlier this month once. However he subsequently developed what appeared to be an intense cellulitis around the left lateral leg wound. I gave him Dalvance I think on 2/12 perhaps 2/13 he continues on cefdinir. Cardin, REILEY KEISLER ([id number]) [id number] Page 22 of 37 The erythema is still present but the warmth and swelling is improved. I am hopeful that the cellulitis part of this control. I wouldn't be surprised if there is an element of venous inflammation as well. 01/17/17. The erythema is present but better in  the left leg. His left lateral leg wound still does not have [name] viable surface buttons certain parts of this long thin wound it appears like there has been improvement in dimensions. 01/20/17; the erythema still present but much better in the left leg. I'm thinking this is his usual degree of chronic venous inflammation. The wound on the left leg looks somewhat better. Is less surface slough 01/27/17; erythema is back to the chronic venous inflammation. The wound on the left leg is somewhat better. I am back to the point where I like to try an Apligraf once again 02/10/17; slight improvement in wound dimensions. Apligraf #2. He is completing his doxycycline 02/14/17; patient arrives today having completed doxycycline last Thursday. This was supposed to be [name] nurse visit however once again he hasn't tense erythema from the medial part of his wound extending over the lower leg. Also erythema in his foot this is roughly in the same distribution as last time. He has baseline chronic venous inflammation however this is [name] lot worse than the baseline I have learned to accept the on him is baseline inflammation 02/24/17- patient is here for follow-up evaluation. He is tolerating compression therapy. His voicing no complaints or concerns he is here anticipating an Apligraf 03/03/17; he arrives today with an adherent necrotic surface. I don't think this is surface is going to be amenable for Apligraf's. The erythema around his wound and on  the left dorsal foot has resolved he is off antibiotics 03/10/17; better-looking surface today. I don't think he can tolerate Apligraf's. He tells me he had [name] wound VAC after [name] skin graft years ago to this area and they had difficulty with [name] seal. The erythema continues to be stable around this some degree of chronic venous inflammation but he also has recurrent cellulitis. We have been using Iodoflex 03/17/17; continued improvement in the surface and may be small changes in dimensions.  Using Iodoflex which seems the only thing that will control his surface 03/24/17- He is here for follow up evaluation of his LLE lateral ulceration and ulcer to right dorsal foot/toe space. He is voicing no complaints or concerns, He is tolerating compression wrap. 03/31/17 arrives today with [name] much healthier looking wound on the left lower extremity. We have been using Iodoflex for [name] prolonged period of time which has for the first time prepared and adequate looking wound bed although we have not had much in the way of wound dimension improvement. He also has [name] small wound between the first and second toe on the right 04/07/17; arrives today with [name] healthy-looking wound bed and at least the top 50% of this wound appears to be now her. No debridement was required I have changed him to [facility] last week after prolonged Iodoflex. He did not do well with Apligraf's. We've had [name] re-opening between the first and second toe on the right 04/14/17; arrives today with [name] healthier looking wound bed contractions and the top 50% of this wound and some on the lesser 50%. Wound bed appears healthy. The area between the first and second toe on the right still remains problematic 04/21/17; continued very gradual improvement. Using [facility] 04/28/17; continued very gradual improvement in the left lateral leg venous insufficiency wound. His periwound erythema is very mild. We have been using Hydrofera Blue. Wound is making progress especially in the superior 50% 05/05/17; he continues to have very gradual improvement in the left lateral venous insufficiency wound. Both in terms with an length rings are improving. I debrided this every 2 weeks with #5 curet and we have been using Hydrofera Blue and again making good progress With regards to the wounds between his right first and second toe which I thought might of been tinea pedis he is not making as much progress very dry scaly skin over the area. Also the area  at the base of the left first and second toe in [name] similar condition 05/12/17; continued gradual improvement in the refractory left lateral venous insufficiency wound on the left. Dimension smaller. Surface still requiring debridement using Hydrofera Blue 05/19/17; continued gradual improvement in the refractory left lateral venous ulceration. Careful inspection of the wound bed underlying rumination suggested some degree of epithelialization over the surface no debridement indicated. Continue Hydrofera Blue difficult areas between his toes first and third on the left than first and second on the right. I'm going to change to silver alginate from silver collagen. Continue ketoconazole as I suspect underlying tinea pedis 05/26/17; left lateral leg venous insufficiency wound. We've been using Hydrofera Blue. I believe that there is expanding epithelialization over the surface of the wound albeit not coming from the wound circumference. This is [name] bit of an odd situation in which the epithelialization seems to be coming from the surface of the wound rather than in the exact circumference. There is still small open areas mostly along the lateral margin of the wound. ooHe has  unchanged areas between the left first and second and the right first second toes which I been treating for tenia pedis 06/02/17; left lateral leg venous insufficiency wound. We have been using Hydrofera Blue. Somewhat smaller from the wound circumference. The surface of the wound remains [name] bit on it almost epithelialized sedation in appearance. I use an open curette today debridement in the surface of all of this especially the edges ooSmall open wounds remaining on the dorsal right first and second toe interspace and the plantar left first second toe and her face on the left 06/09/17; wound on the left lateral leg continues to be smaller but very gradual and very dry surface using Hydrofera Blue 06/16/17 requires weekly debridements now on  the left lateral leg although this continues to contract. I changed to silver collagen last week because of dryness of the wound bed. Using Iodoflex to the areas on his first and second toes/web space bilaterally 06/24/17; patient with history of paraplegia also chronic venous insufficiency with lymphedema. Has [name] very difficult wound on the left lateral leg. This has been gradually reducing in terms of with but comes in with [name] very dry adherent surface. High switch to silver collagen [name] week or so ago with hydrogel to keep the area moist. This is been refractory to multiple dressing attempts. He also has areas in his first and second toes bilaterally in the anterior and posterior web space. I had been using Iodoflex here after [name] prolonged course of silver alginate with ketoconazole was ineffective [question tinea pedis] 07/14/17; patient arrives today with [name] very difficult adherent material over his left lateral lower leg wound. He also has surrounding erythema and poorly controlled edema. He was switched his Santyl last visit which the nurses are applying once during his doctor visit and once on [name] nurse visit. He was also reduced to 2 layer compression I'm not exactly sure of the issue here. 07/21/17; better surface today after 1 week of Iodoflex. Significant cellulitis that we treated last week also better. [Doxycycline] 07/28/17 better surface today with now 2 weeks of Iodoflex. Significant cellulitis treated with doxycycline. He has now completed the doxycycline and he is back to his usual degree of chronic venous inflammation/stasis dermatitis. He reminds me he has had ablations surgery here 08/04/17; continued improvement with Iodoflex to the left lateral leg wound in terms of the surface of the wound although the dimensions are better. 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 Problematic areas on the plantar aspect of the  first second toe web space on the left and the dorsal aspect of the first second toe web space on the right. At one point I felt these were probably related to chronic fungal infections in treated him aggressively for this although we have not made any improvement here. 08/11/17; left lateral leg. Surface continues to improve with the Iodoflex although we are not seeing much improvement in overall wound dimensions. Areas on his plantar left foot and right foot show no improvement. In fact the right foot looks somewhat worse 08/18/17; left lateral leg. We changed to [facility] Blue last week after [name] prolonged course of Iodoflex which helps get the surface better. It appears that the wound with is improved. Continue with difficult areas on the left dorsal first second and plantar first second on the right 09/01/17; patient arrives in clinic today having had [name] temperature of 103 yesterday. He was seen in the ER  and [employer]. The patient was concerned he could have cellulitis again in the right leg however they diagnosed him with [name] UTI and he is now on Keflex. He has [name] history of cellulitis which is been recurrent and difficult but this is been in the left leg, in the past 5 use doxycycline. He does in and out catheterizations at home which are risk factors for UTI 09/08/17; patient will be completing his Keflex this weekend. The erythema on the left leg is considerably better. He has [name] new wound today on the medial part of the right leg small superficial almost looks like [name] skin tear. He has worsening of the area on the right dorsal first and second toe. His [name] area on the left lateral leg is better. Using Hydrofera Blue on all areas 09/15/17; gradual reduction in width on the long wound in the left lateral leg. No debridement required. He also has wounds on the plantar aspect of his left first second toe web space and on the dorsal aspect of the right first second toe web space. 09/22/17; there continues  to be very gradual improvements in the dimensions of the left lateral leg wound. He hasn't round erythematous spot with might be pressure on his wheelchair. There is no evidence obviously of infection no purulence no warmth ooHe has [name] dry scaled area on the plantar aspect of the left first second toe ooImproved area on the dorsal right first second toe. 09/29/17; left lateral leg wound continues to improve in dimensions mostly with an is still [name] fairly long but increasingly narrow wound. ooHe has [name] dry scaled area on the plantar aspect of his left first second toe web space ooIncreasingly concerning area on the dorsal right first second toe. In fact I am concerned today about possible cellulitis around this wound. The areas extending up his second toe and although there is deformities here almost appears to abut on the nailbed. 10/06/17; left lateral leg wound continues to make very gradual progress. Tissue culture I did from the right first second toe dorsal foot last time grew MRSA and enterococcus which was vancomycin sensitive. This was not sensitive to clindamycin or doxycycline. He is allergic to Zyvox and sulfa we have therefore arrange for him to have dalvance infusion tomorrow. He is had this in the past and tolerated it well [place], Kimberly [name] ([id number]) [id number] Page 23 of 37 10/20/17; left lateral leg wound continues to make decent progress. This is certainly reduced in terms of with there is advancing epithelialization.ooThe cellulitis in the right foot looks better although he still has [name] deep wound in the dorsal aspect of the first second toe web space. Plantar left first toe web space on the left I think is making some progress 10/27/17; left lateral leg wound continues to make decent progress. Advancing epithelialization.using Hydrofera Blue ooThe right first second toe web space wound is better-looking using silver alginate ooImprovement in the left  plantar first second toe web space. Again using silver alginate 11/03/17 left lateral leg wound continues to make decent progress albeit slowly. Using Hydrofera Blue ooThe right per second toe web space continues to be [name] very problematic looking punched out wound. I obtained [name] piece of tissue for deep culture I did extensively treated this for fungus. It is difficult to imagine that this is [name] pressure area as the patient states other than going outside he doesn't really wear shoes at home ooThe left plantar first second toe web space looked fairly senescent.  Necrotic edges. This required debridement oochange to Hydrofera Blue to all wound areas 11/10/17; left lateral leg wound continues to contract. Using Hydrofera Blue ooOn the right dorsal first second toe web space dorsally. Culture I did of this area last week grew MRSA there is not an easy oral option in this patient was multiple antibiotic allergies or intolerances. This was only [name] rare culture isolate I'm therefore going to use Bactroban under silver alginate ooOn the left plantar first second toe web space. Debridement is required here. This is also unchanged 11/17/17; left lateral leg wound continues to contract using Hydrofera Blue this is no longer the [name] issue. ooThe [name] concern here is the right first second toe web space. He now has an open area going from dorsally to the plantar aspect. There is now wound on the inner lateral part of the first toe. Not [name] very viable surface on this. There is erythema spreading medially into the forefoot. ooNo [name] change in the left first second toe plantar wound 11/24/17; left lateral leg wound continues to contract using Hydrofera Blue. Nice improvement today ooThe right first second toe web space all of this looks [name] lot less angry than last week. I have given him clindamycin and topical Bactroban for MRSA and terbinafine for the possibility of underlining tinea pedis that I could not  control with ketoconazole. Looks somewhat better ooThe area on the plantar left first second toe web space is weeping with dried debris around the wound 12/01/17; left lateral leg wound continues to contract he Hydrofera Blue. It is becoming thinner in terms of with nevertheless it is making good improvement. ooThe right first second toe web space looks less angry but still [name] large necrotic-looking wounds starting on the plantar aspect of the right foot extending between the toes and now extensively on the base of the right second toe. I gave him clindamycin and topical Bactroban for MRSA anterior benefiting for the possibility of underlying tinea pedis. Not looking better today ooThe area on the left first/second toe looks better. Debrided of necrotic debris 12/05/17* the patient was worked in urgently today because over the weekend he found blood on his incontinence bad when he woke up. He was found to have an ulcer by his wife who does most of his wound care. He came in today for [place] to look at this. He has not had [name] history of wounds in his buttocks in spite of his paraplegia. 12/08/17; seen in follow-up today at his usual appointment. He was seen earlier this week and found to have [name] new wound on his buttock. We also follow him for wounds on the left lateral leg, left first second toe web space and right first second toe web space 12/15/17; we have been using Hydrofera Blue to the left lateral leg which has improved. The right first second toe web space has also improved. Left first second toe web space plantar aspect looks stable. The left buttock has worsened using Santyl. Apparently the buttock has drainage 12/22/17; we have been using Hydrofera Blue to the left lateral leg which continues to improve now 2 small wounds separated by normal skin. He tells [place] he had [name] fever up to 100 yesterday he is prone to UTIs but has not noted anything different. He does in and out catheterizations. The area  between the first and second toes today does not look good necrotic surface covered with what looks to be purulent drainage and erythema extending into the third  toe. I had gotten this to something that I thought look better last time however it is not look good today. He also has [name] necrotic surface over the buttock wound which is expanded. I thought there might be infection under here so I removed [name] lot of the surface with [name] #5 curet though nothing look like it really needed culturing. He is been using Santyl to this area 12/27/17; his original wound on the left lateral leg continues to improve using Hydrofera Blue. I gave him samples of Baxdella although he was unable to take them out of fear for an allergic reaction ["lump in his throat"].the culture I did of the purulent drainage from his second toe last week showed both enterococcus and [name] set Enterobacter I was also concerned about the erythema on the bottom of his foot although paradoxically although this looks somewhat better today. 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 We've been using silver alginate here. They came up today that he sleeps in [name] recliner, I'm not sure why but I asked him to stop this 01/03/18; his original wound we've been using Hydrofera Blue is now separated into 2 areas. ooUlcer on his left buttock is better he is off the recliner and sleeping in bed ooFinally both wound areas between his first and second toes also looks some better 01/10/18; his original wound on the left lateral leg is now separated into 2 wounds we've been using Hydrofera Blue ooUlcer on his left buttock has some drainage. There is [name] small probing site going into muscle layer superiorly.using silver alginate -He arrives today with [name] deep tissue injury on the left heel ooThe wound on the dorsal aspect of his first second toe on the left looks [name] lot  betterusing silver alginate ketoconazole ooThe area on the first second toe web space on the right also looks [name] lot bette 01/17/18; his original wound on the left lateral leg continues to progress using Hydrofera Blue ooUlcer on his left buttock also is smaller surface healthier except for [name] small probing site going into the muscle layer superiorly. 2.4 cm of tunneling in this area ooDTI on his left heel we have only been offloading. Looks better than last week no threatened open no evidence of infection oothe wound on the dorsal aspect of the first second toe on the left continues to look like it's regressing we have only been using silver alginate and terbinafine orally ooThe area in the first second toe web space on the right also looks to be [name] lot better using silver alginate and terbinafine I think this was prompted by tinea pedis 01/31/18; the patient was hospitalized in [place] last week apparently for [name] complicated UTI. He was discharged on cefepime he does in and out catheterizations. In the hospital he was discovered M I don't mild elevation of AST and ALT and the terbinafine was stopped.predictably the pressure ulcer on s his buttock looks betterusing silver alginate. The area on the left lateral leg also is better using Hydrofera Blue. The area between the first and second toes on the left better. First and second toes on the right still substantial but better. Finally the DTI on the left heel has held together and looks like it's resolving 02/07/18-he is here in follow-up evaluation for multiple ulcerations. He has new injury to the lateral aspect of the last issue [name] pressure ulcer, he states this is from  adhesive removal trauma. He states he has tried multiple adhesive products with no success. All other ulcers appear stable. The left heel DTI is resolving. We will continue with same treatment plan and follow-up next week. 02/14/18; follow-up for multiple areas. ooHe has [name] new area  last week on the lateral aspect of his pressure ulcer more over the posterior trochanter. The original pressure ulcer looks quite stable has healthy granulation. 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 Almost fully epithelialized on the original superior area using Hydrofera Blue ooDTI on the left heel has peeled off this week to reveal [name] small superficial wound under denuded skin and subcutaneous tissue ooBoth areas between the first and second toes look better including nothing open on the left 02/21/18; ooThe patient's wounds on his left ischial tuberosity and posterior left greater trochanter actually looked better. He has [name] large area of irritation around the area which I think is contact dermatitis. I am doubtful that this is fungal ooHis original wound on the left lateral calf continues to improve we have been using Hydrofera Blue ooThere is no open area in the left first second toe web space although there is [name] lot of thick callus ooThe DTI on the left heel required debridement today of necrotic surface eschar and subcutaneous tissue using silver alginate ooFinally the area on the right first second toe webspace continues to contract using silver alginate and ketoconazole 02/28/18 ooLeft ischial tuberosity wounds look better using silver alginate. Mefferd, SHEPPARD LUCKENBACH ([id number]) [id number] Page 24 of 37 ooOriginal wound on the left calf only has one small open area left using Hydrofera Blue ooDTI on the left heel required debridement mostly removing skin from around this wound surface. Using silver alginate ooThe areas on the right first/second toe web space using silver alginate and ketoconazole 03/08/18 on evaluation today patient appears to be doing decently well as best I can tell in regard to his wounds. This is the first time that I have seen him  as he generally is followed by Dr. Dellia Nims. With that being said none of his wounds appear to be infected he does have an area where there is some skin covering what appears to be [name] new wound on the left dorsal surface of his great toe. This is right at the nail bed. With that being said I do believe that debrided away some of the excess skin can be of benefit in this regard. Otherwise he has been tolerating the dressing changes without complication. 03/14/18; patient arrives today with the multiplicity of wounds that we are following. He has not been systemically unwell ooOriginal wound on the left lateral calf now only has 2 small open areas we've been using Hydrofera Blue which should continue ooThe deep tissue injury on the left heel requires debridement today. We've been using silver alginate ooThe left first second toe and the right first second toe are both are reminiscence what I think was tinea pedis. Apparently some of the callus Surface between the toes was removed last week when it started draining. ooPurulent drainage coming from the wound on the ischial tuberosity on the left. 03/21/18-He is here in follow-up evaluation for multiple wounds. 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 He tolerated debridement. The only change to last week's recommendations is to discontinue antifungal cream between toes. He will follow-up next week 03/28/18;  following up for multiple wounds;Concern this week is streaking redness and swelling in the right foot. He is going to need antibiotics for this. 03/31/18; follow-up for right foot cellulitis. Streaking redness and swelling in the right foot on 03/28/18. 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 I put him on clindamycin 300 mg every 6 and brought him in for [name] quick check. He has an open wound between his first and second toes on the right foot as [name] potential  source. 04/04/18; ooRight foot cellulitis is resolving he is completing clindamycin. This is truly good news ooLeft lateral calf wound which is initial wound only has one small open area inferiorly this is close to healing out. He has compression stockings. We will use Hydrofera Blue right down to the epithelialization of this ooNonviable surface on the left heel which was initially pressure with [name] DTI. We've been using Hydrofera Blue. I'm going to switch this back to silver alginate ooLeft first second toe/tinea pedis this looks better using silver alginate ooRight first second toe tinea pedis using silver alginate ooLarge pressure ulcers on theLeft ischial tuberosity. Small wound here Looks better. I am uncertain about the surface over the large wound. Using silver alginate 04/11/18; ooCellulitis in the right foot is resolved ooLeft lateral calf wound which was his original wounds still has 2 tiny open areas remaining this is just about closed ooNonviable surface on the left heel is better but still requires debridement ooLeft first second toe/tinea pedis still open using silver alginate ooRight first second toe wound tinea pedis I asked him to go back to using ketoconazole and silver alginate ooLarge pressure ulcers on the left ischial tuberosity this shear injury here is resolved. Wound is smaller. No evidence of infection using silver alginate 04/18/18; ooPatient arrives with an intense area of cellulitis in the right mid lower calf extending into the right heel area. Bright red and warm. Smaller area on the left anterior leg. He has [name] significant history of MRSA. He will definitely need antibioticsoodoxycycline ooHe now has 2 open areas on the left ischial tuberosity the original large wound and now [name] satellite area which I think was above his initial satellite areas. Not [name] wonderful surface on this satellite area surrounding erythema which looks like pressure related. ooHis  left lateral calf wound again his original wound is just about closed ooLeft heel pressure injury still requiring debridement ooLeft first second toe looks [name] lot better using silver alginate ooRight first second toe also using silver alginate and ketoconazole cream also looks better 04/20/18; the patient was worked in early today out of concerns with his cellulitis on the right leg. I had started him on doxycycline. This was 2 days ago. His wife was concerned about the swelling in the area. Also concerned about the left buttock. He has not been systemically unwell no fever chills. No nausea vomiting or diarrhea 04/25/18; the patient's left buttock wound is continued to deteriorate he is using Hydrofera Blue. He is still completing clindamycin for the cellulitis on the right leg although all of this looks better. 05/02/18 ooLeft buttock wound still with [name] lot of drainage and [name] very tightly adherent fibrinous necrotic surface. He has [name] deeper area superiorly ooThe left lateral calf wound is still closed ooDTI wound on the left heel necrotic surface especially the circumference using Iodoflex ooAreas between his left first second toe and right first second toe both look better. Dorsally and the right first second toe he had  [name] necrotic surface although at smaller. In using silver alginate and ketoconazole. I did [name] culture last week which was [name] deep tissue culture of the reminiscence of the open wound on the right first second toe dorsally. This grew [name] few Acinetobacter and [name] few methicillin-resistant staph aureus. Nevertheless the area actually this week looked better. I didn't feel the need to specifically address this at least in terms of systemic antibiotics. 05/09/18; wounds are measuring larger more drainage per our intake. We are using Santyl covered with alginate on the large superficial buttock wounds, Iodosorb on the left heel, ketoconazole and silver alginate to the dorsal first and second  toes bilaterally. 05/16/18; ooThe area on his left buttock better in some aspects although the area superiorly over the ischial tuberosity required an extensive debridement.using Santyl ooLeft heel appears stable. Using Iodoflex ooThe areas between his first and second toes are not bad however there is spreading erythema up the dorsal aspect of his left foot this looks like cellulitis again. He is insensate the erythema is really very brilliant.o Erysipelas He went to see an allergist days ago because he was itching part of this he had lab work done. This showed [name] white count of 15.1 with 70% neutrophils. Hemoglobin of 11.4 and [name] platelet count of 659,000. Last white count we had in Epic was [name] 2-1/2 years ago which was 25.9 but he was ill at the time. He was able to show me some lab work that was done by his primary physician the pattern is about the same. 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 But prompted me to go ahead and do x-rays of both feet and the pelvis rule out osteomyelitis. He also had [name] comprehensive metabolic panel this was reasonably normal his albumin was 3.7 liver function tests BUN/creatinine all normal 05/23/18; x-rays of both his feet from last week were negative for underlying pulmonary abnormality. The x-ray of his pelvis however showed mild irregularity in the left ischial which may represent some early osteomyelitis. The wound in the left ischial continues to get deeper clearly now exposed muscle. Each week necrotic surface material over this area. Whereas the rest of the wounds do not look so bad. ooThe left ischial wound we have been using Santyl and calcium alginate ooT the left heel surface necrotic debris using Iodoflex o ooThe left lateral leg is still healed ooAreas on the left dorsal foot and the right dorsal foot are about the same. There is some inflammation on the left which might represent contact  dermatitis, fungal dermatitis I am doubtful cellulitis although this looks better than last week 05/30/18; CT scan done at Hospital did not show any osteomyelitis or abscess. Suggested the possibility of underlying cellulitis although I don't see [name] lot of evidence of this at the bedside ooThe wound itself on the left buttock/upper thigh actually looks somewhat better. No debridement ooLeft heel also looks better no debridement continue Iodoflex ooBoth dorsal first second toe spaces appear better using Lotrisone. Left still required debridement 06/06/18; ooIntake reported some purulent looking drainage from the left gluteal wound. Using Santyl and calcium alginate ooLeft heel looks better although still [name] nonviable surface requiring debridement ooThe left dorsal foot first/second webspace actually expanding and somewhat deeper. I may consider doing [name] shave biopsy of this area ooRight dorsal foot first/second webspace appears stable to improved. Using Lotrisone and silver alginate to both these areas Rothlisberger, Leno [name] ([id number]) [id number] Page  25 of 37 06/13/18 ooLeft gluteal surface looks better. Now separated in the 2 wounds. No debridement required. Still drainage. We'll continue silver alginate ooLeft heel continues to look better with Iodoflex continue this for at least another week ooOf his dorsal foot wounds the area on the left still has some depth although it looks better than last week. We've been using Lotrisone and silver alginate 06/20/18 ooLeft gluteal continues to look better healthy tissue ooLeft heel continues to look better healthy granulation wound is smaller. He is using Iodoflex and his long as this continues continue the Iodoflex ooDorsal right foot looks better unfortunately dorsal left foot does not. There is swelling and erythema of his forefoot. He had minor trauma to this several days ago but doesn't think this was enough to have caused  any tissue injury. Foot looks like cellulitis, we have had this problem before 06/27/18 on evaluation today patient appears to be doing [name] little worse in regard to his foot ulcer. 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 Both of which would really be his only options for treating this infection. In the past he has been given and effusion of Orbactiv. This is done very well for him in the past again it's one time dosing IV antibiotic therapy. Subsequently I do believe this is something we're gonna need to see about doing at this point in time. Currently his other wounds seem to be doing somewhat better in my pinion I'm pretty happy in that regard. 07/03/18 on evaluation today patient's wounds actually appear to be doing fairly well. He has been tolerating the dressing changes without complication. All in all he seems to be showing signs of improvement. In regard to the antibiotics he has been dealing with infectious disease since I saw him last week as far as getting this scheduled. In the end [facility]'s [facility] this coming Friday. In the meantime he has been continuing to perform the dressing changes in such as previous. There does not appear to be any evidence of infection worsengin at this time. 07/10/18; ooSince I last saw this man 2 weeks ago things have actually improved. IV antibiotics of resulted in less forefoot erythema although there is still some present. He is not systemically unwell ooLeft buttock wounds o2 now have no depth there is increased epithelialization Using silver alginate ooLeft heel still requires debridement using Iodoflex ooLeft dorsal foot still with [name] sizable wound about the size of [name] border but healthy granulation ooRight dorsal foot still with [name] slitlike area using silver  alginate 07/18/18; the patient's cellulitis in the left foot is improved in fact I think it is on its way to resolving. ooLeft buttock wounds o2 both look better although the larger one has hypertension granulation we've been using silver alginate ooLeft heel has some thick circumferential redundant skin over the wound edge which will need to be removed today we've been using Iodoflex ooLeft dorsal foot is still [name] sizable wound required debridement using silver alginate ooThe right dorsal foot is just about closed only [name] small open area remains here 07/25/18; left foot cellulitis is resolved ooLeft buttock wounds o2 both look better. Hyper-granulation on the [name] area ooLeft heel as some debris over the surface but otherwise looks [name] healthier wound. Using silver collagen ooRight dorsal  foot is just about closed 07/31/18; arrives with our intake nurse worried about purulent drainage from the buttock. We had hyper-granulation here last week ooHis buttock wounds o2 continue to look better ooLeft heel some debris over the surface but measuring smaller. ooRight dorsal foot unfortunately has openings between the toes ooLeft foot superficial wound looks less aggravated. 08/07/18 ooButtock wounds continue to look better although some of her granulation and the larger medial wound. silver alginate ooLeft heel continues to look [name] lot better.silver collagen ooLeft foot superficial wound looks less stable. Requires debridement. He has [name] new wound superficial area on the foot on the lateral dorsal foot. ooRight foot looks better using silver alginate without Lotrisone 08/14/2018; patient was in the ER last week diagnosed with [name] UTI. He is now on Cefpodoxime and Macrodantin. ooButtock wounds continued to be smaller. Using silver alginate ooLeft heel continues to look better using silver collagen ooLeft foot superficial wound looks as though it is improving ooRight dorsal foot area is just  about healed. 08/21/2018; patient is completed his antibiotics for his UTI. ooHe has 2 open areas on the buttocks. There is still not closed although the surface looks satisfactory. Using silver alginate ooLeft heel continues to improve using silver collagen ooThe bilateral dorsal foot areas which are at the base of his first and second toes/possible tinea pedis are actually stable on the left but worse on the right. The area on the left required debridement of necrotic surface. After debridement I obtained [name] specimen for PCR culture. ooThe right dorsal foot which is been just about healed last week is now reopened 08/28/2018; culture done on the left dorsal foot showed coag negative staph both staph epidermidis and Lugdunensis. I think this is worthwhile initiating systemic treatment. I will use doxycycline given his long list of allergies. The area on the left heel slightly improved but still requiring debridement. ooThe large wound on the buttock is just about closed whereas the smaller one is larger. Using silver alginate in this area 09/04/2018; patient is completing his doxycycline for the left foot although this continues to be [name] very difficult wound area with very adherent necrotic debris. We are using silver alginate to all his wounds right foot left foot and the small wounds on his buttock, silver collagen on the left heel. 09/11/2018; once again this patient has intense erythema and swelling of the left forefoot. Lesser degrees of erythema in the right foot. He has [name] long list of allergies and intolerances. I will reinstitute doxycycline. oo2 small areas on the left buttock are all the left of his [name] stage III pressure ulcer. Using silver alginate ooLeft heel also looks better using silver collagen ooUnfortunately both the areas on his feet look worse. The area on the left first second webspace is now gone through to the plantar part of his foot. The area on the left foot  anteriorly is irritated with erythema and swelling in the forefoot. 09/25/2018 ooHis wound on the left plantar heel looks better. Using silver collagen ooThe area on the left buttock 2 small remnant areas. One is closed one is still open. Using silver alginate ooThe areas between both his first and second toes look worse. This in spite of long-standing antifungal therapy with ketoconazole and silver alginate which should have antifungal activity ooHe has small areas around his original wound on the left calf one is on the bottom of the original scar tissue and one superiorly both of these are small and superficial but again  given wound history in this site this is worrisome 10/02/2018 ooLeft plantar heel continues to gradually contract using silver collagen ooLeft buttock wound is unchanged using silver alginate ooThe areas on his dorsal feet between his first and second toes bilaterally look about the same. 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 Small open area here. We have been using silver alginate 10/09/2018; ooLeft plantar heel continues to look healthy and contract. No debridement is required ooLeft buttock slightly smaller with [name] tape injury wound just below which was new this week ooDorsal feet somewhat improved I have been using clindamycin ooLeft lateral looks lower extremity the actual open area looks worse although [name] lot of this is epithelialized. I am going to change to silver collagen today He has [name] lot more swelling in the right leg although this is not pitting not red and not particularly warm there is [name] lot of spasm in the right leg usually indicative of people with paralysis of some underlying discomfort. Sikora, RECARDO LINN ([id number]) [id number] Page 26 of  37 We have reviewed his vascular status from 2017 he had [name] left greater saphenous vein ablation. I wonder about referring him back to vascular surgery if the area on the left leg continues to deteriorate. 10/16/2018 in today for follow-up and management of multiple lower extremity ulcers. His left Buttock wound is much lower smaller and almost closed completely. The wound to the left ankle has began to reopen with Epithelialization and some adherent slough. He has multiple new areas to the left foot and leg. The left dorsal foot without much improvement. Wound present between left great webspace and 2nd toe. Erythema and edema present right leg. Right LE ultrasound obtained on 10/10/18 was negative for DVT . 10/23/2018; ooLeft buttock is closed over. Still dry macerated skin but there is no open wound. I suspect this is chronic pressure/moisture ooLeft lateral calf is quite [name] bit worse than when I saw this last. There is clearly drainage here he has macerated skin into the left plantar heel. We will change the primary dressing to alginate ooLeft dorsal foot has some improvement in overall wound area. Still using clindamycin and silver alginate ooRight dorsal foot about the same as the left using clindamycin and silver alginate ooThe erythema in the right leg has resolved. He is DVT rule out was negative ooLeft heel pressure area required debridement although the wound is smaller and the surface is health 10/26/2018 ooThe patient came back in for his nurse check today predominantly because of the drainage coming out of the left lateral leg with [name] recent reopening of his original wound on the left lateral calf. He comes in today with [name] large amount of surrounding erythema around the wound extending from the calf into the ankle and even in the area on the dorsal foot. He is not systemically unwell. He is not febrile. Nevertheless this looks like cellulitis. We have been using silver alginate to  the area. I changed him to [name] regular visit and I am going to prescribe him doxycycline. The rationale here is [name] long list of medication intolerances and [name] history of MRSA. I did not see anything that I thought would provide [name] valuable culture 10/30/2018 ooFollow-up from his appointment 4 days ago with really an extensive area of cellulitis in the left calf left lateral ankle  and left dorsal foot. I put him on doxycycline. He has [name] long list of medication allergies which are true allergy reactions. 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 In any case he is [name] lot better today. The erythema especially in the anterior and lateral left calf is better. He still has left ankle erythema. He also is complaining about increasing edema in the right leg we have only been using Kerlix Coban and he has been doing the wraps at home. Finally he has [name] spotty rash on the medial part of his upper left calf which looks like folliculitis or perhaps wrap occlusion type injury. Small superficial macules not pustules 11/06/18 patient arrives today with again [name] considerable degree of erythema around the wound on the left lateral calf extending into the dorsal ankle and dorsal foot. This is [name] lot worse than when I saw this last week. He is on doxycycline really with not [name] lot of improvement. He has not been systemically unwell Wounds on the; left heel actually looks improved. Original area on the left foot and proximity to the first and second toes looks about the same. He has superficial areas on the dorsal foot, anterior calf and then the reopening of his original wound on the left lateral calf which looks about the same ooThe only area he has on the right is the dorsal webspace first and second which is smaller. ooHe has [name] large area of dry erythematous skin on the left buttock small open area here. 11/13/2018; the patient arrives in much better  condition. The erythema around the wound on the left lateral calf is [name] lot better. Not sure whether this was the clindamycin or the TCA and ketoconazole or just in the improvement in edema control [stasis dermatitis]. In any case this is [name] lot better. The area on the left heel is very small and just about resolved using silver collagen we have been using silver alginate to the areas on his dorsal feet 11/20/2018; his wounds include the left lateral calf, left heel, dorsal aspects of both feet just proximal to the first second webspace. He is stable to slightly improved. I did not think any changes to his dressings were going to be necessary 11/27/2018 he has [name] reopening on the left buttock which is surrounded by what looks like tinea or perhaps some other form of dermatitis. The area on the left dorsal foot has some erythema around it I have marked this area but I am not sure whether this is cellulitis or not. Left heel is not closed. Left calf the reopening is really slightly longer and probably worse 1/13; in general things look better and smaller except for the left dorsal foot. Area on the left heel is just about closed, left buttock looks better only [name] small wound remains in the skin looks better [using Lotrisone] 1/20; the area on the left heel only has [name] few remaining open areas here. Left lateral calf about the same in terms of size, left dorsal foot slightly larger right lateral foot still not closed. The area on the left buttock has no open wound and the surrounding skin looks [name] lot better 1/27; the area on the left heel is closed. Left lateral calf better but still requiring extensive debridements. The area on his left buttock is closed. He still has the open areas on the left dorsal foot which is slightly smaller in the right foot which  is slightly expanded. We have been using Iodoflex on these areas as well 2/3; left heel is closed. Left lateral calf still requiring debridement using  Iodoflex there is no open area on his left buttock however he has dry scaly skin over [name] large area of this. Not really responding well to the Lotrisone. Finally the areas on his dorsal feet at the level of the first second webspace are slightly smaller on the right and about the same on the left. Both of these vigorously debrided with Anasept and gauze 2/10; left heel remains closed he has dry erythematous skin over the left buttock but there is no open wound here. Left lateral leg has come in and with. Still requiring debridement we have been using Iodoflex here. Finally the area on the left dorsal foot and right dorsal foot are really about the same extremely dry callused fissured areas. He does not yet have [name] dermatology appointment 2/17; left heel remains closed. He has [name] new open area on the left buttock. The area on the left lateral calf is bigger longer and still covered in necrotic debris. No [name] change in his foot areas bilaterally. I am awaiting for [name] dermatologist to look on this. We have been using ketoconazole I do not know that this is been doing any good at all. 2/24; left heel remains closed. The left buttock wound that was new reopening last week looks better. The left lateral calf appears better also although still requires debridement. The [name] area on his foot is the left first second also requiring debridement. We have been putting Prisma on all wounds. I do not believe that the ketoconazole has done too much good for his feet. He will use Lotrisone I am going to give him [name] 2-week course of terbinafine. We still do not have [name] dermatology appointment 3/2 left heel remains closed however there is skin over bone in this area I pointed this out to him today. The left buttock wound is epithelialized but still does not look completely stable. The area on the left leg required debridement were using silver collagen here. With regards to his feet we changed to Lotrisone last week and  silver alginate. 3/9; left heel remains closed. Left buttock remains closed. The area on the right foot is essentially closed. The left foot remains unchanged. Slightly smaller on the left lateral calf. Using silver collagen to both of these areas 3/16-Left heel remains closed. Area on right foot is closed. Left lateral calf above the lateral malleolus open wound requiring debridement with easy bleeding. Left dorsal wound proximal to first toe also debrided. Left ischial area open new. Patient has been using Prisma with wrapping every 3 days. Dermatology appointment is apparently tomorrow.Patient has completed his terbinafine 2-week course with some apparent improvement according to him, there is still flaking and dry skin in his foot on the left 3/23; area on the right foot is reopened. The area on the left anterior foot is about the same still [name] very necrotic adherent surface. He still has the area on the left leg and reopening is on the left buttock. He apparently saw dermatology although I do not have [name] note. According to the patient who is usually fairly well informed they did not have any good ideas. Put him on oral terbinafine which she is been on before. 3/30; using silver collagen to all wounds. Apparently his dermatologist put him on doxycycline and rifampin presumably some culture grew staph. I do not have this  result. He remains on terbinafine although I have used terbinafine on him before 4/6; patient has had [name] fairly substantial reopening on the right foot between the first and second toes. He is finished his terbinafine and I believe is on doxycycline and rifampin still as prescribed by dermatology. We have been using silver collagen to all his wounds although the patient reports that he thinks silver alginate does better on the wounds on his buttock. 4/13; the area on his left lateral calf about the same size but it did not require debridement. ooLeft dorsal foot just proximal to  the webspace between the first and second toes is about the same. Still nonviable surface. I note some superficial bronze discoloration of the dorsal part of his foot ooRight dorsal foot just proximal to the first and second toes also looks about the same. I still think there may be the same discoloration I noted above on the left ooLeft buttock wound looks about the same 4/20; left lateral calf appears to be gradually contracting using silver collagen. 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 The left dorsal foot wound is debrided of tightly adherent necrotic debris and really cleans up quite nicely. The right area is worse with expansion. I did not debride this it is now over the base of the second toe Geving, Willia [name] ([id number]) [id number] Page 27 of 37 ooThe area on his left buttock is smaller no debridement is required using silver collagen 5/4; left calf continues to make good progress. ooHe arrives with erythema around the wounds on his dorsal foot which even extends to the plantar aspect. Very concerning for coexistent infection. He is finished the erythromycin I gave him for possible erythrasma this does not seem to have helped. ooThe area on the left foot is about the same base of the dorsal toes ooIs area on the buttock looks improved on the left 5/11; left calf and left buttock continued to make good progress. Left foot is about the same to slightly improved. ooMajor problem is on the right foot. He has not had an x-ray. Deep tissue culture I did last week showed both Enterobacter and [name]. coli. I did not change the doxycycline I put him on empirically although neither 1 of these were plated to doxycycline. He arrives today with the erythema looking worse on both the dorsal and plantar foot. Macerated skin on the bottom of the foot. he has not been systemically unwell 5/18-Patient returns at 1  week, left calf wound appears to be making some progress, left buttock wound appears slightly worse than last time, left foot wound looks slightly better, right foot redness is marginally better. X-ray of both feet show no air or evidence of osteomyelitis. Patient is finished his Omnicef and terbinafine. He continues to have macerated skin on the bottom of the left foot as well as right 5/26; left calf wound is better, left buttock wound appears to have multiple small superficial open areas with surrounding macerated skin. X-rays that I did last time showed no evidence of osteomyelitis in either foot. He is finished cefdinir and doxycycline. I do not think that he was on terbinafine. He continues to have [name] large superficial open area on the right foot anterior dorsal and slightly between the first and second toes. I did send him to dermatology 2 months ago or so wondering about whether they would do [name] fungal scraping. I do not believe they did but did do [name] culture.  We have been using silver alginate to the toe areas, he has been using antifungals at home topically either ketoconazole or Lotrisone. We are using silver collagen on the left foot, silver alginate on the right, silver collagen on the left lateral leg and silver alginate on the left buttock 6/1; left buttock area is healed. We have the left dorsal foot, left lateral leg and right dorsal foot. We are using silver alginate to the areas on both feet and silver collagen to the area on his left lateral calf 6/8; the left buttock apparently reopened late last week. He is not really sure how this happened. He is tolerating the terbinafine. Using silver alginate to all wounds 6/15; left buttock wound is larger than last week but still superficial. ooCame in the clinic today with [name] report of purulence from the left lateral leg I did not identify any infection ooBoth areas on his dorsal feet appear to be better. He is tolerating the terbinafine.  Using silver alginate to all wounds 6/22; left buttock is about the same this week, left calf quite [name] bit better. His left foot is about the same however he comes in with erythema and warmth in the right forefoot once again. Culture that I gave him in the beginning of May showed Enterobacter and [name]. coli. I gave him doxycycline and things seem to improve although neither 1 of these organisms was specifically plated. 6/29; left buttock is larger and dry this week. Left lateral calf looks to me to be improved. Left dorsal foot also somewhat improved right foot completely unchanged. The erythema on the right foot is still present. He is completing the Ceftin dinner that I gave him empirically [see discussion above.) 7/6 - All wounds look to be stable and perhaps improved, the left buttock wound is slightly smaller, per patient bleeds easily, completed ceftin, the right foot redness is less, he is on terbinafine 7/13; left buttock wound about the same perhaps slightly narrower. Area on the left lateral leg continues to narrow. Left dorsal foot slightly smaller right foot about the same. We are using silver alginate on the right foot and Hydrofera Blue to the areas on the left. Unna boot on the left 2 layer compression on the right 7/20; left buttock wound absolutely the same. Area on lateral leg continues to get better. Left dorsal foot require debridement as did the right no [name] change in the 7/27; left buttock wound the same size necrotic debris over the surface. The area on the lateral leg is closed once again. His left foot looks better right foot about the same although there is some involvement now of the posterior first second toe area. He is still on terbinafine which I have given him for [name] month, not certain [name] centimeter [name] change 06/25/19-All wounds appear to be slightly improved according to report, left buttock wound looks clean, both foot wounds have minimal to no debris the right dorsal  foot has minimal slough. We are using Hydrofera Blue to the left and silver alginate to the right foot and ischial wound. 8/10-Wounds all appear to be around the same, the right forefoot distal part has some redness which was not there before, however the wound looks clean and small. Ischial wound looks about the same with no changes 8/17; his wound on the left lateral calf which was his original chronic venous insufficiency wound remains closed. Since I last saw him the areas on the left dorsal foot right dorsal foot generally appear  better but require debridement. The area on his left initial tuberosity appears somewhat larger to me perhaps hyper granulated and bleeds very easily. We have been using Hydrofera Blue to the left dorsal foot and silver alginate to everything else 8/24; left lateral calf remains closed. The areas on his dorsal feet on the webspace of the first and second toes bilaterally both look better. The area on the left buttock which is the pressure ulcer stage II slightly smaller. I change the dressing to Hydrofera Blue to all areas 8/31; left lateral calf remains closed. The area on his dorsal feet bilaterally look better. Using Hydrofera Blue. Still requiring debridement on the left foot. No change in the left buttock pressure ulcers however 9/14; left lateral calf remains closed. Dorsal feet look quite [name] bit better than 2 weeks ago. Flaking dry skin also [name] lot better with the ammonium lactate I gave him 2 weeks ago. The area on the left buttock is improved. He states that his Roho cushion developed [name] leak and he is getting [name] new one, in the interim he is offloading this vigorously 9/21; left calf remains closed. Left heel which was [name] possible DTI looks better this week. He had macerated tissue around the left dorsal foot right foot looks satisfactory and improved left buttock wound. I changed his dressings to his feet to silver alginate bilaterally. Continuing Hydrofera Blue on  the left buttock. 9/28 left calf remains closed. Left heel did not develop anything [possible DTI] dry flaking skin on the left dorsal foot. Right foot looks satisfactory. Improved left buttock wound. We are using silver alginate on his feet Hydrofera Blue on the buttock. I have asked him to go back to the Lotrisone on his feet including the wounds and surrounding areas 10/5; left calf remains closed. The areas on the left and right feet about the same. [name] lot of this is epithelialized however debris over the remaining open areas. He is using Lotrisone and silver alginate. The area on the left buttock using Hydrofera Blue 10/26. Patient has been out for 3 weeks secondary to Covid concerns. He tested negative but I think his wife tested positive. He comes in today with the left foot substantially worse, right foot about the same. Even more concerning he states that the area on his left buttock closed over but then reopened and is considerably deeper in one aspect than it was before [stage III wound] 11/2; left foot really about the same as last week. Quarter sized wound on the dorsal foot just proximal to the first second toes. Surrounding erythema with areas of denuded epithelium. This is not really much different looking. Did not look like cellulitis this time however. ooRight foot area about the same.. We have been using silver alginate alginate on his toes ooLeft buttock still substantial irritated skin around the wound which I think looks somewhat better. We have been using Hydrofera Blue here. 11/9; left foot larger than last week and [name] very necrotic surface. Right foot I think is about the same perhaps slightly smaller. Debris around the circumference also addressed. Unfortunately on the left buttock there is been [name] decline. Satellite lesions below the [name] wound distally and now [name] an additional one posteriorly we have been using Hydrofera Blue but I think this is [name] pressure issue 11/16; left  foot ulcer dorsally again [name] very adherent necrotic surface. Right foot is about the same. Not much change in the pressure ulcer on his left buttock. 11/30; left foot  ulcer dorsally basically the same as when I saw him 2 weeks ago. Very adherent fibrinous debris on the wound surface. Patient reports [name] lot of drainage as well. 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 We have been using Iodoflex, patient changed back to alginate because of the drainage. Area on his right dorsal foot really looks benign with [name] healthier surface certainly [name] lot better than on the left. Left buttock wounds all improved using Hydrofera Blue 12/7; left dorsal foot again no improvement. Tightly adherent debris. PCR culture I did last week only showed likely skin contaminant. I have gone ahead and done [name] punch biopsy of this which is about the last thing in terms of investigations I can think to do. He has known venous insufficiency and venous hypertension and this could be the issue here. The area on the right foot is about the same left buttock slightly worse according to our intake nurse secondary to [facility] Blue sticking to the wound 12/14; biopsy of the left foot that I did last time showed changes that could be related to wound healing/chronic stasis dermatitis phenomenon no neoplasm. We have been using silver alginate to both feet. I change the one on the left today to Sorbact and silver alginate to his other 2 wounds 12/28; the patient arrives with the following problems; ooMajor issue is the dorsal left foot which continues to be [name] larger deeper wound area. Still with [name] completely nonviable surface ooParadoxically the area mirror image on the right on the right dorsal foot appears to be getting better. Lemberger, AUBERY DOUTHAT ([id number]) [id number] Page 28 of Strong had some loss of dry denuded skin from the lower part of his original wound on  the left lateral calf. Some of this area looked [name] little vulnerable and for this reason we put him in wrap that on this side this week ooThe area on his left buttock is larger. He still has the erythematous circular area which I think is [name] combination of pressure, sweat. This does not look like cellulitis or fungal dermatitis 11/26/2019; -Dorsal left foot large open wound with depth. Still debris over the surface. Using Sorbact ooThe area on the dorsal right foot paradoxically has closed over [facility] has [name] reopening on the left ankle laterally at the base of his original wound that extended up into the calf. This appears clean. ooThe left buttock wound is smaller but with very adherent necrotic debris over the surface. We have been using silver alginate here as well The patient had arterial studies done in 2017. He had biphasic waveforms at the dorsalis pedis and posterior tibial bilaterally. ABI in the left was 1.17. Digit waveforms were dampened. He has slight spasticity in the great toes I do not think [name] TBI would be possible 1/11; the patient comes in today with [name] sizable reopening between the first and second toes on the right. This is not exactly in the same location where we have been treating wounds previously. According to our intake nurse this was actually fairly deep but 0.6 cm. The area on the left dorsal foot looks about the same the surface is somewhat cleaner using Sorbact, his MRI is in 2 days. We have not managed yet to get arterial studies. The new reopening on the left lateral calf looks somewhat better using alginate. The left buttock wound is about the same using alginate 1/18; the patient had his ARTERIAL studies which were quite normal. ABI  in the right at 1.13 with triphasic/biphasic waveforms on the left ABI 1.06 again with triphasic/biphasic waveforms. It would not have been possible to have done [name] toe brachial index because of spasticity. We have been using Sorbac to the  left foot alginate to the rest of his wounds on the right foot left lateral calf and left buttock 1/25; arrives in clinic with erythema and swelling of the left forefoot worse over the first MTP area. This extends laterally dorsally and but also posteriorly. Still has an area on the left lateral part of the lower part of his calf wound it is eschared and clearly not closed. ooArea on the left buttock still with surrounding irritation and erythema. ooRight foot surface wound dorsally. The area between the right and first and second toes appears better. 2/1; ooThe left foot wound is about the same. Erythema slightly better I gave him [name] week of doxycycline empirically ooRight foot wound is more extensive extending between the toes to the plantar surface ooLeft lateral calf really no open surface on the inferior part of his original wound however the entire area still looks vulnerable ooAbsolutely no improvement in the left buttock wound required debridement. 2/8; the left foot is about the same. Erythema is slightly improved I gave him clindamycin last week. ooRight foot looks better he is using Lotrimin and silver alginate ooHe has [name] breakdown in the left lateral calf. Denuded epithelium which I have removed ooLeft buttock about the same were using Hydrofera Blue 2/15; left foot is about the same there is less surrounding erythema. Surface still has tightly adherent debris which I have debriding however not making any progress ooRight foot has [name] substantial wound on the medial right second toe between the first and second webspace. ooStill an open area on the left lateral calf distal area. ooButtock wound is about the same 2/22; left foot is about the same less surrounding erythema. Surface has adherent debris. Polymen Ag Right foot area significant wound between the first and second toes. We have been using silver alginate here Left lateral leg polymen Ag at the base of his original  venous insufficiency wound ooLeft buttock some improvement here 3/1; ooRight foot is deteriorating in the first second toe webspace. Larger and more substantial. We have been using silver alginate. ooLeft dorsal foot about the same markedly adherent surface debris using PolyMem Ag ooLeft lateral calf surface debris using PolyMem AG ooLeft buttock is improved again using PolyMem Ag. ooHe is completing his terbinafine. The erythema in the foot seems better. He has been on this for 2 weeks 3/8; no improvement in any wound area in fact he has [name] small open area on the dorsal midfoot which is new this week. He has not gotten his foot x-rays yet 3/15; his x-rays were both negative for osteomyelitis of both feet. No [name] change in any of his wounds on the extremities however his buttock wounds are better. We have been using polymen on the buttocks, left lower leg. Iodoflex on the left foot and silver alginate on the right 3/22; arrives in clinic today with the 2 [name] issues are the improvement in the left dorsal foot wound which for once actually looks healthy with [name] nice healthy wound surface without debridement. Using Iodoflex here. Unfortunately on the left lateral calf which is in the distal part of his original wound he came to the clinic here for there was purulent drainage noted some increased breakdown scattered around the original area and [name] small  area proximally. We we are using polymen here will change to silver alginate today. His buttock wound on the left is better and I think the area on the right first second toe webspace is also improved 3/29; left dorsal foot looks better. Using Iodoflex. Left ankle culture from deterioration last time grew [name]. coli, Enterobacter and Enterococcus. I will give him [name] course of cefdinir although that will not cover Enterococcus. The area on the right foot in the webspace of the first and second toe lateral first toe looks better. The area on his  buttock is about healed Vascular appointment is on April 21. This is to look at his venous system vis--vis continued breakdown of the wounds on the left including the left lateral leg and left dorsal foot he. He has had previous ablations on this side 4/5; the area between the right first and second toes lateral aspect of the first toe looks better. Dorsal aspect of the left first toe on the left foot also improved. Unfortunately the left lateral lower leg is larger and there is [name] second satellite wound superiorly. The usual superficial abrasions on the left buttock overall better but certainly not closed 4/12; the area between the right first and second toes is improved. Dorsal aspect of the left foot also slightly smaller with [name] vibrant healthy looking surface. No real change in the left lateral leg and the left buttock wound is healed He has an unaffordable co-pay for Apligraf. Appointment with vein and vascular with regards to the left leg venous part of the circulation is on 4/21 4/19; we continue to see improvement in all wound areas. Although this is minor. He has his vascular appointment on 4/21. The area on the left buttock has not reopened although right in the center of this area the skin looks somewhat threatened 4/26; the left buttock is unfortunately reopened. In general his left dorsal foot has [name] healthy surface and looks somewhat smaller although it was not measured as such. The area between his first and second toe webspace on the right as [name] small wound against the first toe. The patient saw vascular surgery. The real question I was asking was about the small saphenous vein on the left. He has previously ablated left greater saphenous vein. Nothing further was commented on on the left. Right greater saphenous vein without reflux at the saphenofemoral junction or proximal thigh there was no indication for ablation of the right greater saphenous vein duplex was negative for DVT  bilaterally. They did not think there was anything from [name] vascular surgery point of view that could be offered. They ABIs within normal limits 5/3; only small open area on the left buttock. The area on the left lateral leg which was his original venous reflux is now 2 wounds both which look clean. We are using Iodoflex on the left dorsal foot which looks healthy and smaller. He is down to [name] very tiny area between the right first and second toes, using silver alginate 5/10; all of his wounds appear better. We have much better edema control in 4 layer compression on the left. This may be the factor that is allowing the left foot and left lateral calf to heal. He has external compression garments at home 04/14/20-All of his wounds are progressing well, the left forefoot is practically closed, left ischium appears to be about the same, right toe webspace is also smaller. The left lateral leg is about the same, continue using Hydrofera Blue to this,  silver alginate to the ischium, Iodoflex to the toe space on the right 6/7; most of his wounds outside of the left buttock are doing well. The area on the left lateral calf and left dorsal foot are smaller. The area on the right foot in between the first and second toe webspace is barely visible although he still says there is some drainage here is the only reason I did not heal this out. ooUnfortunately the area on the left buttock almost looks like he has [name] skin tear from tape. He has open wound and then [name] large flap of skin that we are trying to get adherence over an area just next to the remaining wound 6/21; 2 week follow-up. I believe is been here for nurse visits. Miraculously the area between his first and second toes on the left dorsal foot is closed over. Still open on the right first second web space. The left lateral calf has 2 open areas. Distally this is more superficial. The proximal area had [name] little more depth Lebeda, Damek [name] ([id number])  [id number] Page 29 of 37 and required debridement of adherent necrotic material. His buttock wound is actually larger we have been using silver alginate here 6/28; the patient's area on the left foot remains closed. Still open wet area between the first and second toes on the right and also extending into the plantar aspect. We have been using silver alginate in this location. He has 2 areas on the left lower leg part of his original long wounds which I think are better. We have been using Hydrofera Blue here. Hydrofera Blue to the left buttock which is stable 7/12; left foot remains closed. Left ankle is closed. May be [name] small area between his right first and second toes the only truly open area is on the left buttock. We have been using Hydrofera Blue here 7/19; patient arrives with marked deterioration especially in the left foot and ankle. We did not put him in [name] compression wrap on the left last week in fact he wore his juxta lite stockings on either side although he does not have an underlying stocking. He has [name] reopening on the left dorsal foot, left lateral ankle and [name] new area on the right dorsal ankle. More worrisome is the degree of erythema on the left foot extending on the lateral foot into the lateral lower leg on the left 7/26; the patient had erythema and drainage from the lateral left ankle last week. Culture of this grew MRSA resistant to doxycycline and clindamycin which are the 2 antibiotics we usually use with this patient who has multiple antibiotic allergies including linezolid, trimethoprim sulfamethoxazole. I had give him an empiric doxycycline and he comes in the area certainly looks somewhat better although it is blotchy in his lower leg. He has not been systemically unwell. He has had areas on the left dorsal foot which is [name] reopening, chronic wounds on the left lateral ankle. Both of these I think are secondary to chronic venous insufficiency.  The area between his first and second toes is closed as far as I can tell. He had [name] new wrap injury on the right dorsal ankle last week. Finally he has an area on the left buttock. We have been using silver alginate to everything except the left buttock we are using Hydrofera Blue 06/30/20-Patient returns at 1 week, has been given [name] sample dose pack of NUZYRA which is [name] tetracycline derivative [omadacycline], patient has completed those, we have  been using silver alginate to almost all the wounds except the left ischium where we are using Hydrofera Blue all of them look better 8/16; since I last saw the patient he has been doing well. The area on the left buttock, left lateral ankle and left foot are all closed today. He has completed the [place] I gave him last time and tolerated this well. He still has open areas on the right dorsal ankle and in the right first second toe area which we are using silver alginate. 8/23; we put him in his bilateral external compression stockings last week as he did not have anything open on either leg except for concerning area between the right first and second toe. He comes in today with an area on the left dorsal foot slightly more proximal than the original wound, the left lateral foot but this is actually [name] continuation of the area he had on the left lateral ankle from last time. As well he is opened up on the left buttock again. 8/30; comes in today with things looking [name] lot better. The area on the left lower ankle has closed down as has the left foot but with eschar in both areas. The area on the dorsal right ankle is also epithelialized. Very little remaining of the left buttock wound. We have been using silver alginate on all wound areas 9/13; the area in the first second toe webspace on the right has fully epithelialized. He still has some vulnerable epithelium on the right and the ankle and the dorsal foot. He notes weeping. He is using his juxta lite  stocking. On the left again the left dorsal foot is closed left lateral ankle is closed. We went to the juxta lite stocking here as well. ooStill vulnerable in the left buttock although only 2 small open areas remain here 9/27; 2-week follow-up. We did not look at his left leg but the patient says everything is closed. He is [name] bit disturbed by the amount of edema in his left foot he is using juxta lite stockings but asking about over the toes stockings which would be 30/40, will talk to him next time. According to him there is no open wound on either the left foot or the left ankle/calf He has an open area on the dorsal right calf which I initially point [name] wrap injury. He has superficial remaining wound on the left ischial tuberosity been using silver alginate although he says this sticks to the wound 10/5; we gave him 2-week follow-up but he called yesterday expressing some concerns about his right foot right ankle and the left buttock. He came in early. There is still no open areas on the left leg and that still in his juxta lite stocking 10/11; he only has 1 small area on the left buttock that remains measuring millimeters 1 mm. Still has the same irritated skin in this area. We recommended zinc oxide when this eventually closes and pressure relief is meticulously is he can do this. He still has an area on the dorsal part of his right first through third toes which is [name] bit irritated and still open and on the dorsal ankle near the crease of the ankle. We have been using silver alginate and using his own stocking. He has nothing open on the left leg or foot 10/25; 2-week follow-up. Not nearly as good on the left buttock as I was hoping. For open areas with 5 looking threatened small. He has the erythematous irritated  chronic skin in this area. oo1 area on the right dorsal ankle. He reports this area bleeds easily ooRight dorsal foot just proximal to the base of his toes ooWe have been using  silver alginate. 11/8; 2-week follow-up. Left buttock is about the same although I do not think the wounds are in the same location we have been using silver alginate. I have asked him to use zinc oxide on the skin around the wounds. ooHe still has [name] small area on the right dorsal ankle he reports this bleeds easily ooRight dorsal foot just proximal to the base of the toes does not have anything open although the skin is very dry and scaly ooHe has [name] new opening on the nailbed of the left great toe. Nothing on the left ankle 11/29; 3-week follow-up. Left buttock has 2 open areas. And washing of these wounds today started bleeding easily. Suggesting very friable tissue. We have been using silver alginate. Right dorsal ankle which I thought was initially [name] wrap injury we have been using silver alginate. Nothing open between the toes that I can see. He states the area on the left dorsal toe nailbed healed after the last visit in 2 or 3 days 12/13; 3-week follow-up. His left buttock now has 3 open areas but the original 2 areas are smaller using polymen here. Surrounding skin looks better. The right dorsal ankle is closed. He has [name] small opening on the right dorsal foot at the level of the third toe. In general the skin looks better here. He is wearing his juxta lite stocking on the left leg says there is nothing open 11/24/2020; 3 weeks follow-up. His left buttock still has the 3 open areas. We have been using polymen but due to lack of response he changed to [facility] area. Surrounding skin is dry erythematous and irritated looking. There is no evidence of infection either bacterial or fungal however there is loss of surface epithelium ooHe still has very dry skin in his foot causing irritation and erythema on the dorsal part of his toes. This is not responded to prolonged courses of antifungal simply looks dry and irritated 1/24; left buttock area still looks about the same he was unable to  find the triad ointment that we had suggested. The area on the right lower leg just above the dorsal ankle has reopened and the areas on the right foot between the first second and second third toes and scaling on the bottom of the foot has been about the same for quite some time now. been using silver alginate to all wound areas 2/7; left buttock wound looked quite good although not much smaller in terms of surface area surrounding skin looks better. Only [name] few dry flaking areas on the right foot in between the first and second toes the skin generally looks better here [ammonium lactate]. Finally the area on the right dorsal ankle is closed 2/21; ooThere is no open area on the right foot even between the right first and second toe. Skin around this area dorsally and plantar aspects look better. ooHe has [name] reopening of the area on the right ankle just above the crease of the ankle dorsally. I continue to think that this is probably friction from spasms may be even this time with his stocking under the compression stockings. ooWounds on his left buttock look about the same there [name] couple of areas that have reopened. He has [name] total square area of loss of epithelialization. This does  not look like infection it looks like [name] contact dermatitis but I just cannot determine to what 3/14; there is nothing on the right foot between the first and second toes this was carefully inspected under illumination. Some chronic irritation on the dorsal part of his foot from toes 1-3 at the base. Nothing really open here substantially. Still has an area on the right foot/ankle that is actually larger and hyper granulated. His buttock area on the left is just about closed however he has chronic inflammation with loss of the surface epithelial layer 3/28; 2-week follow-up. In clinic today with [name] new wound on the left anterior mid tibia. Says this happened about 2 weeks ago. He is not really sure how wonders about the  spasticity of his legs at night whether that could have caused this other than that he does not have [name] good idea. He has been using topical antibiotics and silver alginate. The area on his right dorsal ankle seems somewhat better. ooFinally everything on his left buttock is closed. 4/11; 2-week follow-up. All of his wounds are better except for the area over the ischium and left buttock which have opened up widely again. At least part of this is covered in necrotic fibrinous material another part had rolled nonviable skin. The area on the right ankle, left anterior mid tibia are both [name] lot better. He had no open wounds on either foot including the areas between the first and second toes 4/25; patient presents for 2-week follow-up. He states that the wounds are overall stable. He has no complaints today and states he is using Hydrofera Blue to open wounds. 5/9; have not seen this man in over [name] month. For my memory he has open areas on the left mid tibia and right ankle. T oday he has new open area on the right dorsal foot which we have not had [name] problem with recently. He has the sustained area on the left buttock Oshields, Sina [name] ([id number]) [id number] Page 30 of 37 He is also changed his insurance at the beginning of the year [employer]. We will need prior authorizations for debridement 5/23; patient presents for 2-week follow-up. He has prior authorizations for debridement. He denies any issues in the past 2 weeks with his wound care. He has been using Hydrofera Blue to all the wounds. He does report [name] circular rash to the upper left leg that is new. He denies acute signs of infection. 6/6; 2-week follow-up. The patient has open wounds on the left buttock which are worse than the last time I saw this about [name] month ago. He also has [name] new area to me on the left anterior mid tibia with some surrounding erythema. The area on the dorsal ankle on the right is closed but I  think this will be [name] friction injury every time this area is exposed to either our wraps or his compression stockings caused by unrelenting spasms in this leg. 6/20; 2-week follow-up. oo The patient has open wounds on the left buttock which is about the same. Using [facility] here. - The left mid tibia has [name] static amount of surrounding erythema. Also [name] raised area in the center. We have been using Hydrofera Blue here. ooooFinally he has broken down in his dorsal right foot extending between the first and second toes and going to the base of the first and second toe webspace. I have previously assumed that this was severe venous hypertension 7/5; 2-week follow-up oo The left buttock  wound actually looks better. We are using Hydrofera Blue. He has extensive skin irritation around this area and I have not really been able to get that any better. I have tried Lotrisone i.[name]. antifungals and steroids. More most recently we have just been using Coloplast really looks about the same. oo The left mid tibia which was new last week culture to have very resistant staph aureus. Not only methicillin-resistant but doxycycline resistant. The patient has [name] plethora of antibiotic allergies including sulfa, linezolid. I used topical bacitracin on this but he has not started this yet. oo In addition he has an expanding area of erythema with [name] wound on the dorsal right foot. I did [name] deep tissue culture of this area today 7/12; oo Left buttock area actually looks better surrounding skin also looks less irritated. oo Left mid tibia looks about the same. He is using bacitracin this is not worse oo Right dorsal foot looks about the same as well. oo The left first toe also looks about the same 7/19; left buttock wound continues to improve in terms of open areas oo Left mid tibia is still concerning amount of swelling he is using bacitracin oo Dorsal left first toe somewhat smaller oo Right dorsal foot  somewhat smaller 7/25; left buttock wound actually continues to improve oo Left mid tibia area has less swelling. I gave him all my samples of new Nuzyra. This seems to have helped although the wound is still open it. His abrasion closed by here oo Left dorsal great toe really no better. Still [name] very nonviable surface oo Right dorsal foot perhaps some better. oo We have been using bacitracin and silver nitrate to the areas on his lower legs and Hydrofera Blue to the area on the buttock. 8/16 oo Disappointed that his left buttock wound is actually more substantial. Apparently during the last nurse visit these were both very small. He has continued irritation to [name] large area of skin on his buttock. I have never been able to totally explain this although I think it some combination of the way he sits, pressure, moisture. He is not incontinent enough to contribute to this. oo Left dorsal great toe still fibrinous debris on the surface that I have debrided today oo Large area across the dorsal right toes. oo The area on the left anterior mid tibia has less swelling. He completed the [place]. This does not look infected although the tissue is still fried 8/30; 2-week follow-up. oo Left buttock areas not improved. We used Hydrofera Blue on this. Weeping wet with the surrounding erythema that I have not been able to control even with Lotrisone and topical Coloplast oo Left dorsal great toe looks about the same oo More substantial area again at the base of his toes on the left which is new this week. oo Area across the dorsal right toes looks improved oo The left anterior mid tibia looks like it is trying to close 9/13; 2-week follow-up. Using silver alginate on all of his wounds. The left dorsal foot does not look any better. He has the area on the dorsal toe and also the areas at the base of all of his toes 1 through 3. On the right foot he has [name] similar pattern in [name] similar area. He has  the area on his left mid tibia that looks fairly healthy. Finally the area on his left buttock looks somewhat bette 9/20; culture I did of the left foot which was [name] deep tissue culture  last time showed [name]. coli he has erythema around this wound. Still [name] completely necrotic surface. His right dorsal foot looks about the same. He has [name] very friable surface to the left anterior mid tibia. Both buttock wounds look better. We have been using silver alginate to all wounds 10/4; he has completed the cephalexin that I gave him last time for the left foot. He is using topical gentamicin under silver alginate silver alginate being applied to all the wounds. Unfortunately all the wounds look irritated on his dorsal right foot dorsal left foot left mid tibia. I wonder if this could be [name] silver allergy. I am going to change him to [facility] on the lower extremity. The skin on the left buttock and left posterior thigh still flaking dry and irritated. This has continued no matter what I have applied topically to this. He has [name] solitary open wound which by itself does not look too bad however the entire area of surrounding skin does not change no matter what we have applied here 10/18; the area on the left dorsal foot and right dorsal foot both look better. The area on the right extends into the plantar but not between his toes. We have been using silver alginate. He still has [name] rectangular erythematous area around the area on the left tibia. The wound itself is very small. Finally everything on his left buttock looks [name] little larger the skin is erythematous 11/15; patient comes in with the left dorsal and right dorsal foot distally looking somewhat better. Still nonviable surface on the left foot which required debridement. He still has the area on the left anterior mid tibia although this looks somewhat better. He has [name] new area on the right lateral lower leg just above the ankle. Finally his left buttock looks  terrible with multiple superficial open wounds geometric square shaped area of chronic erythema which I have not been able to sort through 11/29; right dorsal foot and left dorsal foot both look somewhat better. No debridement required. He has the fragile area on the left anterior mid tibia this looks and continues to look somewhat better. Right lateral lower leg just above the ankle we identified last time also looks better. In general the area on his left buttock looks improved. We are using Hydrofera Blue to all wound areas 12/13; right dorsal foot looks better. The area on the right lateral leg is healed. Left dorsal foot has 2 open areas both of which require debridement. The fragile area on the left anterior mid tibial looks better. Smaller area on his buttocks. Were using Hydrofera Blue 1/10; patient comes in with everything looking slightly larger and/or worse. This includes his left buttock, reopening of the left mid tibia, larger areas on the left dorsal foot and what looks to be [name] cellulitis on the right dorsal and plantar foot. We have been using Hydrofera Blue on all wounds. 1/17; right dorsal foot distally looks better today. The left foot has 2 open wounds that are about the same surrounding erythema. 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 No malignancy they did not stain for fungus 1/24; his right distal foot is not closed dry and scaly but the wound looks like it is contracting. I did not debride anything here. Problem on his left dorsal foot with expanding erythema. Apparently there were problems last week getting the [place] however it  is now available at the [facility] but [name] week later. He is using ketoconazole and Coloplast to the left buttock along with Hydrofera Blue this actually looks quite [name] bit better today. 1/31; right dorsal  foot again is dry and scaly but looking to contract. He has been using [name] moisturizer on his feet at my request but he is not sure which 1. The left dorsal foot wounds look about the same there is erythema here that I marked last week however after course of Nuzyra it certainly is not any better but not any worse either. Lemme, SIVAN QUAST ([id number]) [id number] Page 31 of 37 Finally on his left buttock the skin continues to look better he has the original wound but [name] new substantial area towards the gluteal cleft. Almost like [name] skin tear. I used scissors to remove skin and subcutaneous tissue here silver nitrate and direct pressure 2/7; right dorsal foot. This does not look too much different from last week. Some erythema skin dry and scaly. No debridement. Left dorsal great toe again still not much improvement. I did remove flaking dry skin and callus from around the edge. Finally on his left buttock. The skin is somewhat better in the periwound. Surface wounds are superficial somewhat better than last week. 01/26/2022: Is [name] little bit of [name] mystery as to why his wounds fail to respond to treatments and actually seem to get worse. This is my first encounter with this patient. He was previously followed by Dr. Dellia Nims. Based upon my review of the chart, it seems that there is [name] little bit of [name] mystery as to why his wounds do not respond as anticipated to the interventions applied and sometimes even get worse. Biopsies have been performed and he was seen by dermatology in [place], but that did not shed any light on the matter. T oday, his gluteal wound is larger, with substantial drainage, rather malodorous. The food wounds are not terrible, but he has [name] lot of callus and scaly skin around these. He is currently getting silver alginate on the gluteal wound, with idodoflex to the feet. He is using lotrisone on his legs for the dry, scaly skin. 02/09/2022: There has really been no  change to any of his wounds. The gluteal wound less drainage and odor, but remains about the same size, the periwound skin remains oddly scaly. His lower extremity wounds also appear roughly the same size. They continue to accumulate [name] small amount of slough. The periwound on his feet and ankle wounds has dry eschar and loose dead skin. We have been using silver alginate on the gluteal wound and Iodoflex on his feet and ankle wounds. T the periwound around his gluteal lesion and Lotrisone on his feet and legs. o 02/23/2022: The right plantar foot wound is closed. The gluteal site looks small but has continued to produce hypertrophic granulation tissue. The foot wounds all look about the same on the dorsal surface of the right foot; on the left, there is only [name] small open area at the site of where his left great toenail would have been. 03/16/2022: The right ankle wound is healed. The right dorsal foot wound is about the same. The left dorsal foot wound is quite [name] bit smaller and the ischial wound is nearly closed. 03/30/2022: The right ankle wound reopened. Both dorsal foot wounds are quite [name] bit smaller. Unfortunately, he appears to have sheared part of his ischial wound open further, perhaps during [name] transfer. 04/13/2022: The  right ankle wound has hypertrophic granulation tissue present. The dorsal foot wounds continue to decrease in size. The ischial wound looks about the same today, no better, no worse. 04/27/2022: The right ankle wound has closed. Unfortunately, it looks like some moisture got underneath the dry skin on both of his dorsal feet and these wounds have expanded in size. The ischial wound remains the same with perhaps [name] little bit more slough accumulation than at our previous visit. 05/11/2022: The right ankle wound remains closed. There is [name] left anterior tibial wound that is small has patchy openings with accumulated slough. The dorsum of his right foot appears to be nearly healed with  just [name] small punctate opening. The plantar surface of his right foot has [name] new opening that looks like he may have picked some skin there. His sacral ulcer has hypertrophic granulation tissue but has some slough accumulation. The dorsum of his left foot has multiple open areas in [name] fairly ragged distribution. All of these have slough accumulated within them. 06/01/2022: The right ankle and left anterior tibial wound are both closed. Dorsum of his right foot and left foot both look substantially better with just tiny scattered openings The without any slough accumulation. He has sheared open new areas on his left gluteus and ischium. He says that his wheelchair cushion, which is air-filled, has [name] leak and so it keeps deflating. He is awaiting [name] new cushion. 06/15/2022: The right ankle wound has reopened and the fat layer is exposed. Both dorsal feet have just small openings with just [name] little bit of slough and eschar accumulation. The wound on his left gluteus and ischium is larger again today and has [name] foul odor. 06/29/2022: The right ankle wound has hypertrophic granulation tissue buildup. His dorsal foot wounds both look better with just some eschar on the surface. He has [name] new wound on his left lateral ankle. He is not sure how he acquired it but by appearance, it looks that he hit it on something, potentially his wheelchair or bed. The ischial wound is about the same but is cleaner without any significant purulent drainage or odor. He did not understand what the [facility] call was about and therefore he does not have the topical compounded antibiotic. 07/13/2022: The right ankle wound again has hypertrophic granulation tissue, but less so than at his previous visit. The ischial wound has improved tremendously the use of the [facility] topical antibiotic. No significant change to the left lateral ankle wound; it is fibrotic with slough present. The skin of both of his feet, especially on the right, has [name]  yeasty appearance. 08/10/2022: There is again hypertrophic granulation tissue on the right anterior ankle wound. Both feet are about the same. The left lateral ankle wound is [name] little bit desiccated and has some slough buildup. He unfortunately suffered [name] new injury when he was removing his pants and they caught his bandage which caused [name] large skin tear on his left ischium, just distal to the existing wound. The existing left ischial wound, however, is significantly better with just [name] little light slough on the surface. 08/31/2022: The right anterior ankle wound is [name] lot smaller today underneath some eschar. No accumulation of hypertrophic granulation tissue. The left lateral ankle wound has some slough on the surface but is better in terms of moisture balance this week. The left dorsal foot does not really have any openings on it today. The right dorsal foot has some slough and eschar accumulation. His gluteal  ulcer is basically closed aside from 2 small areas that are oozing [name] bit. 09/21/2022: The right anterior ankle wound is down to just [name] tiny pinhole. The left lateral ankle wound has accumulated slough again, but moisture balance is good. Left and right dorsal feet both have 2 small openings with some slough in them. The gluteal ulcer, unfortunately has opened up substantially. 10/05/2022: The right anterior ankle wound has reopened and has [name] fair amount of slough on the surface. The left lateral ankle wound has accumulated more slough, as well. He was approved for Apligraf, but the wound is not clean enough yet. There is some slough buildup on the dorsal right foot wound, as well as on his ischium. 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 Patient History Information obtained from Patient. Family History Diabetes - Father, Hypertension - Mother, No family history of Cancer, Heart Disease, Hereditary Spherocytosis, Kidney Disease,  Lung Disease, Stroke, Thyroid Problems, Tuberculosis. Social History Former smoker, Marital Status - Married, Alcohol Use - Moderate, Drug Use - No History, Caffeine Use - Daily. Medical History Ear/Nose/Mouth/Throat Denies history of Chronic sinus problems/congestion, Middle ear problems Hematologic/Lymphatic Denies history of Anemia, Hemophilia, Human Immunodeficiency Virus, Lymphedema, Sickle Cell Disease Respiratory Patient has history of Sleep Apnea - does not tolerate cpap Denies history of Aspiration, Asthma, Chronic Obstructive Pulmonary Disease (COPD), Pneumothorax, Tuberculosis Cardiovascular Patient has history of Hypertension - lisinopril HCTZ Mossbarger, Brien [name] ([id number]) [id number] Page 32 of 37 Denies history of Angina, Arrhythmia, Congestive Heart Failure, Coronary Artery Disease, Deep Vein Thrombosis, Hypotension, Myocardial Infarction, Peripheral Arterial Disease, Peripheral Venous Disease, Phlebitis, Vasculitis Gastrointestinal Denies history of Cirrhosis , Colitis, Crohnoos, Hepatitis [name], Hepatitis B, Hepatitis C Endocrine Denies history of Type I Diabetes, Type II Diabetes Genitourinary Denies history of End Stage Renal Disease Immunological Denies history of Lupus Erythematosus, Raynaudoos, Scleroderma Integumentary (Skin) Denies history of History of Burn Musculoskeletal Denies history of Gout, Rheumatoid Arthritis, Osteoarthritis, Osteomyelitis Neurologic Patient has history of Paraplegia Denies history of Dementia, Neuropathy, Quadriplegia, Seizure Disorder Oncologic Denies history of Received Chemotherapy, Received Radiation Psychiatric Denies history of Anorexia/bulimia, Confinement Anxiety Hospitalization/Surgery History - cellulitis in leg. - left leg vein ablation. Objective Constitutional Slightly tachycardic, asymptomatic. No acute distress.. Vitals Time Taken: 8:42 AM, Height: 70 in, Weight: 216 lbs, BMI: 31,  Temperature: 97.9 F, Pulse: 108 bpm, Respiratory Rate: 18 breaths/min, Blood Pressure: 132/81 mmHg. Respiratory Normal work of breathing on room air.. General Notes: 10/05/2022: The right anterior ankle wound has reopened and has [name] fair amount of slough on the surface. The left lateral ankle wound has accumulated more slough, as well. There is some slough buildup on the dorsal right foot wound, as well as on his ischium. Integumentary (Hair, Skin) Wound #52 status is Open. Original cause of wound was Gradually Appeared. The date acquired was: 03/27/2021. The wound has been in treatment 79 weeks. The wound is located on the Right,Dorsal Foot. The wound measures 0.9cm length x 4.2cm width x 0.1cm depth; 2.969cm^2 area and 0.297cm^3 volume. There is Fat Layer (Subcutaneous Tissue) exposed. There is no tunneling or undermining noted. There is [name] medium amount of serosanguineous drainage noted. The wound margin is flat and intact. There is medium (34-66%) red granulation within the wound bed. There is [name] medium (34-66%) amount of necrotic tissue within the wound bed including Adherent Slough. The periwound skin appearance had no abnormalities noted for texture. The periwound skin appearance exhibited:  Dry/Scaly, Erythema. The surrounding wound skin color is noted with erythema. Erythema is measured at 3 cm. Periwound temperature was noted as No Abnormality. Wound #56 status is Open. Original cause of wound was Gradually Appeared. The date acquired was: 07/11/2021. The wound has been in treatment 63 weeks. The wound is located on the Left,Dorsal Foot. The wound measures 0.1cm length x 0.1cm width x 0.1cm depth; 0.008cm^2 area and 0.001cm^3 volume. There is Fat Layer (Subcutaneous Tissue) exposed. There is no tunneling or undermining noted. There is [name] small amount of serous drainage noted. The wound margin is flat and intact. There is small (1-33%) red, pink granulation within the wound bed. There is no  necrotic tissue within the wound bed. The periwound skin appearance had no abnormalities noted for texture. The periwound skin appearance exhibited: Dry/Scaly, Erythema. The periwound skin appearance did not exhibit: Maceration. The surrounding wound skin color is noted with erythema. Periwound temperature was noted as Hot. Wound #65 status is Open. Original cause of wound was Pressure Injury. The date acquired was: 05/24/2022. The wound has been in treatment 18 weeks. The wound is located on the Left Upper Leg. The wound measures 4.9cm length x 3cm width x 0.1cm depth; 11.545cm^2 area and 1.155cm^3 volume. There is Fat Layer (Subcutaneous Tissue) exposed. There is no tunneling or undermining noted. There is [name] large amount of serosanguineous drainage noted. Foul odor after cleansing was noted. The wound margin is flat and intact. There is large (67-100%) red, friable granulation within the wound bed. There is [name] small (1-33%) amount of necrotic tissue within the wound bed including Adherent Slough. The periwound skin appearance had no abnormalities noted for color. The periwound skin appearance exhibited: Scarring, Dry/Scaly. The periwound skin appearance did not exhibit: Maceration. Periwound temperature was noted as No Abnormality. Wound #66 status is Open. Original cause of wound was Pressure Injury. The date acquired was: 06/15/2022. The wound has been in treatment 16 weeks. The wound is located on the Right,Anterior Ankle. The wound measures 1.4cm length x 1.2cm width x 0.1cm depth; 1.319cm^2 area and 0.132cm^3 volume. There is Fat Layer (Subcutaneous Tissue) exposed. There is no tunneling or undermining noted. There is [name] medium amount of serosanguineous drainage noted. The wound margin is flat and intact. There is medium (34-66%) red, friable granulation within the wound bed. There is [name] medium (34-66%) amount of necrotic tissue within the wound bed including Adherent Slough. The periwound skin  appearance had no abnormalities noted for texture. The periwound skin appearance had no abnormalities noted for moisture. The periwound skin appearance exhibited: Erythema. The surrounding wound skin color is noted with erythema. Erythema is measured at 1.5 cm. Periwound temperature was noted as No Abnormality. Wound #67 status is Open. Original cause of wound was Gradually Appeared. The date acquired was: 06/22/2022. The wound has been in treatment 14 weeks. The wound is located on the Left,Lateral Lower Leg. The wound measures 4cm length x 1.6cm width x 0.2cm depth; 5.027cm^2 area and 1.005cm^3 volume. There is Fat Layer (Subcutaneous Tissue) exposed. There is no tunneling or undermining noted. There is [name] medium amount of serosanguineous drainage noted. The wound margin is distinct with the outline attached to the wound base. There is medium (34-66%) red granulation within the wound bed. There is [name] medium (34-66%) amount of necrotic tissue within the wound bed including Adherent Slough. The periwound skin appearance had no abnormalities noted for texture. The periwound skin appearance exhibited: Dry/Scaly, Erythema. The surrounding wound skin color is noted  with erythema with red streaks. Periwound temperature was noted as No Abnormality. 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 There was [name] Selective/Open Wound Non-Viable Tissue Debridement with [name] total area of 3.78 sq cm performed by Fredirick Maudlin, MD. With the following instrument(s): Curette to remove Viable and Non-Viable tissue/material. Material removed includes [place]. No specimens were taken. [name] time out was conducted at 09:10, prior to the start of the procedure. [name] Minimum amount of bleeding was controlled with Pressure. The procedure was tolerated well with [name] pain level of Insensate throughout and [name] pain level of Insensate following the procedure. Post Debridement Measurements: 0.9cm length x 4.2cm width x 0.1cm depth; 0.297cm^3 volume. Character of Wound/Ulcer Post Debridement is improved. Post procedure Diagnosis Wound #52: Same as Pre-Procedure General Notes: Scribed for Dr. Celine Ahr by Baruch Gouty, RN. Wound #65 Pre-procedure diagnosis of Wound #65 is [name] Pressure Ulcer located on the Left Upper Leg . There was [name] Selective/Open Wound Non-Viable Tissue Debridement with [name] total area of 14.7 sq cm performed by Fredirick Maudlin, MD. With the following instrument(s): Curette to remove Non-Viable tissue/material. Material removed includes [facility]. No specimens were taken. [name] time out was conducted at 09:10, prior to the start of the procedure. [name] Minimum amount of bleeding was controlled with Pressure. The procedure was tolerated well with [name] pain level of Insensate throughout and [name] pain level of Insensate following the procedure. Post Debridement Measurements: 4.9cm length x 3cm width x 0.1cm depth; 1.155cm^3 volume. Post debridement Stage noted as Category/Stage III. Character of Wound/Ulcer Post Debridement is improved. Post procedure Diagnosis Wound #65: Same as Pre-Procedure General Notes: Scribed for Dr. Celine Ahr by Baruch Gouty, RN. Wound #66 Pre-procedure diagnosis of Wound #66 is [name] Venous Leg Ulcer located on the  Right,Anterior Ankle .Severity of Tissue Pre Debridement is: Fat layer exposed. There was [name] Excisional Skin/Subcutaneous Tissue Debridement with [name] total area of 1.68 sq cm performed by Fredirick Maudlin, MD. With the following instrument(s): Curette to remove Viable and Non-Viable tissue/material. Material removed includes Subcutaneous Tissue and Slough and. No specimens were taken. [name] time out was conducted at 09:10, prior to the start of the procedure. [name] Minimum amount of bleeding was controlled with Pressure. The procedure was tolerated well with [name] pain level of Insensate throughout and [name] pain level of Insensate following the procedure. Post Debridement Measurements: 1.4cm length x 1.2cm width x 0.1cm depth; 0.132cm^3 volume. Character of Wound/Ulcer Post Debridement is improved. Severity of Tissue Post Debridement is: Fat layer exposed. Post procedure Diagnosis Wound #66: Same as Pre-Procedure General Notes: Scribed for Dr. Celine Ahr by Baruch Gouty, RN. 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 There was [name] Excisional Skin/Subcutaneous Tissue Debridement with [name] total area of 6.4 sq cm performed by Fredirick Maudlin,  MD. With the following instrument(s): Curette to remove Viable and Non-Viable tissue/material. Material removed includes Subcutaneous Tissue and Slough and. No specimens were taken. [name] time out was conducted at 09:10, prior to the start of the procedure. [name] Minimum amount of bleeding was controlled with Pressure. The procedure was tolerated well with [name] pain level of Insensate throughout and [name] pain level of Insensate following the procedure. Post Debridement Measurements: 4cm length x 1.6cm width x 0.2cm depth; 1.005cm^3 volume. Character of Wound/Ulcer Post Debridement is improved. Severity of Tissue Post Debridement is: Fat layer exposed. Post procedure Diagnosis  Wound #67: Same as Pre-Procedure General Notes: scribed by Baruch Gouty, RN for Dr. Celine Ahr. Plan Follow-up Appointments: Return Appointment in 2 weeks. - Dr. Celine Ahr RM 1 Bathing/ Shower/ Hygiene: May shower and wash wound with soap and water. - on days that dressing is changed Edema Control - Lymphedema / SCD / Other: Elevate legs to the level of the heart or above for 30 minutes daily and/or when sitting, [name] frequency of: - throughout the day Moisturize legs daily. - using Aquaphor generously to both legs and feet with dressing changes Compression stocking or Garment 30-40 mm/Hg pressure to: - Juxtalite to both legs daily Off-Loading: Roho cushion for wheelchair Turn and reposition every 2 hours The following medication(s) was prescribed: doxycycline hyclate oral 100 mg capsule 1 capsule p.o. twice daily x10 days; separate from multivitamin and zinc by 6 hours starting 10/05/2022 mupirocin topical 2 % ointment Apply thin layer to ankle wound with dressing changes starting 10/05/2022 WOUND #52: - Foot Wound Laterality: Dorsal, Right Cleanser: Soap and Water Every Other Day/30 Days Discharge Instructions: May shower and wash wound with dial antibacterial soap and water prior to dressing change. Clabaugh, MUJTABA BOLLIG ([id number]) [id number] Page 34 of 37 Peri-Wound Care: Sween Lotion (Moisturizing lotion) Every Other Day/30 Days Discharge Instructions: Apply Aquaphor moisturizing lotion as directed Peri-Wound Care: Lotrisone Every Other Day/30 Days Discharge Instructions: or antifungal spray, Apply Lotrisone to periwound Prim Dressing: KerraCel Ag Gelling Fiber Dressing, 2x2 in (silver alginate) Every Other Day/30 Days ary Discharge Instructions: Apply silver alginate to wound bed as instructed Secondary Dressing: Woven Gauze Sponge, Non-Sterile 4x4 in Every Other Day/30 Days Discharge Instructions: Apply over primary dressing as directed. Secured With: [employer], 4.5x3.1 (in/yd) (Generic) Every Other Day/30 Days Discharge Instructions: Secure with Kerlix as directed. Secured With: 69M Medipore H Soft Cloth Surgical T ape, 4 x 10 (in/yd) (Generic) Every Other Day/30 Days Discharge Instructions: Secure with tape as directed. Com pression Stockings: Circaid Juxta Lite Compression Wrap Compression Amount: 30-40 mmHg (right) Discharge Instructions: Apply Circaid Juxta Lite Compression Wrap daily as instructed. Apply first thing in the morning, remove at night before bed. WOUND #56: - Foot Wound Laterality: Dorsal, Left Cleanser: Soap and Water Every Other Day/30 Days Discharge Instructions: May shower and wash wound with dial antibacterial soap and water prior to dressing change. Peri-Wound Care: Sween Lotion (Moisturizing lotion) Every Other Day/30 Days Discharge Instructions: Apply Aquaphor moisturizing lotion as directed Peri-Wound Care: Lotrisone Every Other Day/30 Days Discharge Instructions: or antifungal spray, Apply Lotrisone to periwound Prim Dressing: KerraCel Ag Gelling Fiber Dressing, 2x2 in (silver alginate) Every Other Day/30 Days ary Discharge Instructions: Apply silver alginate to wound bed as instructed Secondary Dressing: Woven Gauze Sponge, Non-Sterile 4x4 in Every Other Day/30 Days Discharge Instructions: Apply over primary dressing as directed. Secured With: [employer], 4.5x3.1 (in/yd) (Generic) Every Other Day/30 Days Discharge Instructions: Secure with Kerlix as directed. Secured  With: 29M Medipore H Soft Cloth Surgical T ape, 4 x 10 (in/yd) (Generic) Every Other Day/30 Days Discharge Instructions: Secure with tape as directed. WOUND #65: - Upper Leg Wound Laterality: Left Cleanser: Soap and Water Every Other Day/30 Days Discharge Instructions: May shower and wash wound with dial antibacterial soap and water prior to dressing change. Peri-Wound Care: Zinc Oxide Ointment 30g tube Every Other Day/30 Days Discharge  Instructions: Apply Zinc Oxide to periwound with each dressing change Topical: Keystone antibiotic compound Every Other Day/30 Days Discharge Instructions: thin layer to wound bed, use daily once available Prim Dressing: Hydrofera Blue Classic Foam, 4x4 in Every Other Day/30 Days ary Discharge Instructions: Moisten with saline prior to applying to wound bed Secondary Dressing: Zetuvit Plus Silicone Border Dressing 5x5 (in/in) Every Other Day/30 Days Discharge Instructions: Apply silicone border over primary dressing as directed. WOUND #66: - Ankle Wound Laterality: Right, Anterior Cleanser: Soap and Water Every Other Day/30 Days Discharge Instructions: May shower and wash wound with dial antibacterial soap and water prior to dressing change. Peri-Wound Care: Sween Lotion (Moisturizing lotion) Every Other Day/30 Days Discharge Instructions: Apply Aquaphor moisturizing lotion as directed Prim Dressing: KerraCel Ag Gelling Fiber Dressing, 2x2 in (silver alginate) Every Other Day/30 Days ary Discharge Instructions: Apply silver alginate to wound bed as instructed Secured With: Kerlix Roll Sterile, 4.5x3.1 (in/yd) (Generic) Every Other Day/30 Days Discharge Instructions: Secure with Kerlix as directed. Secured With: 29M Med[employer] H Soft Cloth Surgical T ape, 4 x 10 (in/yd) (Generic) Every Other Day/30 Days Discharge Instructions: Secure with tape as directed. WOUND #67: - Lower Leg Wound Laterality: Left, Lateral Cleanser: Soap and Water Every Other Day/30 Days Discharge Instructions: May shower and wash wound with dial antibacterial soap and water prior to dressing change. Peri-Wound Care: Triamcinolone 15 (g) Every Other Day/30 Days Discharge Instructions: Use triamcinolone 15 (g) as directed Peri-Wound Care: Sween Lotion (Moisturizing lotion) Every Other Day/30 Days Discharge Instructions: Apply Aquaphor moisturizing lotion as directed Peri-Wound Care: Lotrisone Every Other Day/30  Days Discharge Instructions: Apply Lotrisone to periwound Topical: Mupirocin Ointment Every Other Day/30 Days Discharge Instructions: Apply Mupirocin (Bactroban) as instructed Prim Dressing: KerraCel Ag Gelling Fiber Dressing, 2x2 in (silver alginate) Every Other Day/30 Days ary Discharge Instructions: Apply silver alginate to wound bed as instructed Secondary Dressing: Woven Gauze Sponge, Non-Sterile 4x4 in Every Other Day/30 Days Discharge Instructions: Apply over primary dressing as directed. Secured With: [employer], 4.5x3.1 (in/yd) (Generic) Every Other Day/30 Days Discharge Instructions: Secure with Kerlix as directed. Secured With: 29M Medipore H Soft Cloth Surgical T ape, 4 x 10 (in/yd) (Generic) Every Other Day/30 Days Discharge Instructions: Secure with tape as directed. 10/05/2022: The right anterior ankle wound has reopened and has [name] fair amount of slough on the surface. The left lateral ankle wound has accumulated more slough, as well. There is some slough buildup on the dorsal right foot wound, as well as on his ischium. I used [name] curette to debride slough and nonviable subcutaneous tissue from the left lateral ankle wound and the right anterior ankle wound. I debrided slough from his dorsal foot wound and his ischium. I sent in [name] new prescription for doxycycline and hopefully he will get this filled. I am also going to add topical mupirocin to the ankle wound dressing. Continue Keystone topical compounded antibiotic to the ischium with Hydrofera Blue and silver alginate to the other wound sites. Follow-up in 2 weeks. Electronic Signature(s) Signed: 10/05/2022 12:07:49 PM By: Fredirick Maudlin MD FACS Signed:  10/05/2022 5:53:41 PM By: Baruch Gouty RN, BSN Previous Signature: 10/05/2022 9:42:48 AM Version By: Fredirick Maudlin MD FACS Previous Signature: 10/05/2022 9:39:33 AM Version By: Fredirick Maudlin MD FACS Ebbert, [name] ([id number])  [id number] Page 35 of 37 Previous Signature: 10/05/2022 9:39:33 AM Version By: Fredirick Maudlin MD FACS Entered By: Baruch Gouty on 10/05/2022 11:58:07 -------------------------------------------------------------------------------- HxROS Details Patient Name: Date of Service: Ow, [name] 10/05/2022 8:15 [name] Medical Record Number: [id number] Patient Account Number: [id number] Date of Birth/Sex: Treating RN: 1986-04-10 (36 y.o. M) Primary Care Provider: [place], [place] Other Clinician: Referring Provider: Treating Provider/Extender: Cornelia Copa [name]: 352 Information Obtained From Patient Ear/Nose/Mouth/Throat Medical History: Negative for: Chronic sinus problems/congestion; Middle ear problems Hematologic/Lymphatic Medical History: Negative for: Anemia; Hemophilia; Human Immunodeficiency Virus; Lymphedema; Sickle Cell Disease Respiratory Medical History: Positive for: Sleep Apnea - does not tolerate cpap Negative for: Aspiration; Asthma; Chronic Obstructive Pulmonary Disease (COPD); Pneumothorax; Tuberculosis Cardiovascular Medical History: Positive for: Hypertension - lisinopril HCTZ Negative for: Angina; Arrhythmia; Congestive Heart Failure; Coronary Artery Disease; Deep Vein Thrombosis; Hypotension; Myocardial Infarction; Peripheral Arterial Disease; Peripheral Venous Disease; Phlebitis; Vasculitis Gastrointestinal Medical History: Negative for: Cirrhosis ; Colitis; Crohns; Hepatitis [name]; Hepatitis B; Hepatitis C Endocrine Medical History: Negative for: Type I Diabetes; Type II Diabetes Genitourinary Medical History: Negative for: End Stage Renal Disease Immunological Medical History: Negative for: Lupus Erythematosus; Raynauds; Scleroderma Integumentary (Skin) Medical History: Negative for: History of Burn Musculoskeletal Medical History: Negative for: Gout; Rheumatoid Arthritis; Osteoarthritis;  Osteomyelitis Neurologic Medical History: Dorrough, Aeneas [name] ([id number]) [id number] Page 36 of 37 Positive for: Paraplegia Negative for: Dementia; Neuropathy; Quadriplegia; Seizure Disorder Oncologic Medical History: Negative for: Received Chemotherapy; Received Radiation Psychiatric Medical History: Negative for: Anorexia/bulimia; Confinement Anxiety Immunizations Pneumococcal Vaccine: Received Pneumococcal Vaccination: No Immunization Notes: doesn't remember when last tetanus shot was Implantable Devices No devices added Hospitalization / Surgery History Type of Hospitalization/Surgery cellulitis in leg left leg vein ablation Family and Social History Cancer: No; Diabetes: Yes - Father; Heart Disease: No; Hereditary Spherocytosis: No; Hypertension: Yes - Mother; Kidney Disease: No; Lung Disease: No; Stroke: No; Thyroid Problems: No; Tuberculosis: No; Former smoker; Marital Status - Married; Alcohol Use: Moderate; Drug Use: No History; Caffeine Use: Daily; Financial Concerns: No; Food, Clothing or Shelter Needs: No; Support System Lacking: No; Transportation Concerns: No Electronic Signature(s) Signed: 10/05/2022 12:07:49 PM By: Fredirick Maudlin MD FACS Entered By: Fredirick Maudlin on 10/05/2022 09:35:04 -------------------------------------------------------------------------------- SuperBill Details Patient Name: Date of Service: Reale, [name] 10/05/2022 Medical Record Number: [id number] Patient Account Number: [id number] Date of Birth/Sex: Treating RN: [birth year] (36 y.o. 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, 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 Non-pressure chronic ulcer of other part of left foot limited to breakdown 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 Facility Procedures : Hys[place], ALE CPT4 Code: [id number] X [name] ([id number]) L L Description: 28315 - DEB SUBQ TISSUE 20 SQ CM/< ICD-10 Diagnosis Description [phone] 97.312 Non-pressure chronic ulcer of right 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 97.322  Non-pressure chronic ulcer of left 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 Modifier: [id number] Quantity: 1 27.pdf Page 37 of 37 : 7 CPT4 Code: [id number] ICD L L Description: 7 - DEBRIDE WOUND 1ST 20 SQ CM OR < -10 Diagnosis Description 97.511 Non-pressure chronic ulcer of other part of right foot limited to breakdown of sk 89.323 Pressure ulcer of left buttock, stage 3 Modifier: 1 in Quantity: Physician Procedures : CPT4 Code Description Modifier [id number] - WC PHYS LEVEL 4 - EST PT 25 ICD-10 Diagnosis Description 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, stage 3 L97.521 Non-pressure chronic ulcer of other part of left foot limited to breakdown of skin L97.312  Non-pressure chronic ulcer of right 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 L97.322 Non-pressure chronic ulcer of left 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 Quantity: 1 : [id number] 11042 - WC PHYS SUBQ TISS 20 SQ CM ICD-10 Diagnosis Description 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 Quantity: 1 : [id number] 97597 - WC PHYS DEBR WO ANESTH 20 SQ CM ICD-10 Diagnosis Description L97.511 Non-pressure chronic ulcer of other part of right foot limited to breakdown of skin L89.323 Pressure ulcer of left  buttock, stage 3 Quantity: 1 Electronic Signature(s) Signed: 10/05/2022 9:43:49 AM By: Fredirick Maudlin MD FACS Entered By: Fredirick Maudlin on 10/05/2022 09:43:47

## 2022-10-08 ENCOUNTER — Ambulatory Visit: Payer: BC Managed Care – PPO | Admitting: Behavioral Health

## 2022-10-19 ENCOUNTER — Encounter (HOSPITAL_BASED_OUTPATIENT_CLINIC_OR_DEPARTMENT_OTHER): Payer: BC Managed Care – PPO | Admitting: General Surgery

## 2022-10-19 ENCOUNTER — Ambulatory Visit: Payer: BC Managed Care – PPO | Admitting: Behavioral Health

## 2022-10-19 ENCOUNTER — Encounter: Payer: Self-pay | Admitting: Behavioral Health

## 2022-10-19 DIAGNOSIS — F341 Dysthymic disorder: Secondary | ICD-10-CM | POA: Diagnosis not present

## 2022-10-19 MED ORDER — SERTRALINE HCL 100 MG PO TABS: 150.0000 mg | ORAL_TABLET | Freq: Every day | ORAL | 2 refills | Status: DC

## 2022-10-19 NOTE — Progress Notes (Addendum)
[facility]  Patient ID: ADE STMARIE,  MRN: [id number]  PCP: Janine Limbo, PA-C  Date of Evaluation: 10/19/2022 Time spent:30 minutes  Chief Complaint:  Chief Complaint   Depression; Follow-up; Patient Education; Medication Refill; Medication Problem     HISTORY/CURRENT STATUS: HPI   "[name]", 36 year old male presents to this office for follow up and medication refill.  Says he feels like he has been becoming more depressed over the last couple of months with seasonal changes. Says that in past years this has not effected him.  He is reporting anhedonia, lack of motivation and drive. He cannot identify any known triggers.  He is requesting medication adjustment at this time. He reports 0/10 depression, and 0/10 anxiety. He sleeps 7 plus hours per night. Denies any significant social changes. He is wheelchair bound and has non healing wounds on both legs bilaterally. He goes to wound care once weekly. No changes in his social life. He denies mania, no psychosis. No SI/HI.    No prior psychiatric medication failures    Individual Medical History/ Review of Systems: Changes? :No   Allergies: Sulfa antibiotics, Levofloxacin, Meropenem, Penicillins, and Zyvox [linezolid]  Current Medications:  Current Outpatient Medications:    sertraline (ZOLOFT) 100 MG tablet, Take 1.5 tablets (150 mg total) by mouth daily., Disp: 45 tablet, Rfl: 2   Amino Acids-Protein Hydrolys (FEEDING SUPPLEMENT, PRO-STAT SUGAR FREE 64,) LIQD, Take 30 mLs by mouth 2 (two) times daily., Disp: , Rfl:    Ascorbic Acid (VITAMIN C) 1000 MG tablet, Take 1,000 mg by mouth daily., Disp: , Rfl:    baclofen (LIORESAL) 20 MG tablet, Take 40-60 mg by mouth 3 (three) times daily. '60mg'$  in the morning, '40mg'$  in the afternoon, and '60mg'$  in the evening, Disp: , Rfl:    imipramine (TOFRANIL) 50 MG tablet, Take 50 mg by mouth at bedtime. , Disp: , Rfl:    lisinopril-hydrochlorothiazide (PRINZIDE,ZESTORETIC) 20-25 MG per  tablet, Take 1 tablet by mouth daily., Disp: , Rfl:    Melatonin 10 MG CAPS, Take by mouth. Nightly, Disp: , Rfl:    Multiple Vitamin (MULTIVITAMIN) tablet, Take 1 tablet by mouth daily., Disp: , Rfl:    Omadacycline Tosylate (NUZYRA) 150 MG TABS, Take 3 tablets by mouth daily for 2 days, then take 2 tablets by mouth daily for 6 days., Disp: 18 tablet, Rfl: 0   oxybutynin (DITROPAN XL) 15 MG 24 hr tablet, Take 30 mg by mouth at bedtime. , Disp: , Rfl:    sertraline (ZOLOFT) 100 MG tablet, TAKE 1 TABLET(100 MG) BY MOUTH DAILY AFTER BREAKFAST, Disp: 30 tablet, Rfl: 4   zinc sulfate 220 (50 Zn) MG capsule, Take 220 mg by mouth daily., Disp: , Rfl:  Medication Side Effects: none  Family Medical/ Social History: Changes? No  MENTAL HEALTH EXAM:  There were no vitals taken for this visit.There is no height or weight on file to calculate BMI.  General Appearance: Casual, Neat, and Well Groomed  Eye Contact:  Good  Speech:  Clear and Coherent  Volume:  Normal  Mood:  Depressed and Dysphoric  Affect:  Appropriate and Depressed  Thought Process:  Coherent  Orientation:  Full (Time, Place, and Person)  Thought Content: Logical   Suicidal Thoughts:  No  Homicidal Thoughts:  No  Memory:  WNL  Judgement:  Good  Insight:  Good  Psychomotor Activity:  Normal  Concentration:  Concentration: Good  Recall:  Good  Fund of Knowledge: Good  Language:  Good  Assets:  Desire for Improvement  ADL's:  Intact  Cognition: WNL  Prognosis:  Good    DIAGNOSES:    ICD-10-CM   1. Persistent depressive disorder with melancholic features, currently mild  F34.1 sertraline (ZOLOFT) 100 MG tablet      Receiving Psychotherapy: No    RECOMMENDATIONS:   Greater than 50% of  30 min face to face time with patient was spent on counseling and coordination of care. We discussed his recent mild decline over the last couple of months due to seasonal changes and reduced daylight. He is requesting dosage increase of  his zoloft this visit.   To increase Zoloft to 150 mg  daily. [facility].  He has CPAP and Tofranil for sleep apnea and neurogenic bladder from his medical services.   He does take melatonin 10 mg nightly as needed for insomnia OTC.   Will report worsening symptoms promptly Will follow up in 6 weeks to reassess Provided emergency contact information Reviewed [place], NP

## 2022-10-26 ENCOUNTER — Encounter (HOSPITAL_BASED_OUTPATIENT_CLINIC_OR_DEPARTMENT_OTHER): Payer: BC Managed Care – PPO | Attending: General Surgery | Admitting: General Surgery

## 2022-10-26 DIAGNOSIS — L97312 Non-pressure chronic ulcer of right 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: Secondary | ICD-10-CM | POA: Diagnosis not present

## 2022-10-26 DIAGNOSIS — Z9081 Acquired absence of spleen: Secondary | ICD-10-CM | POA: Diagnosis not present

## 2022-10-26 DIAGNOSIS — L89323 Pressure ulcer of left buttock, stage 3: Secondary | ICD-10-CM | POA: Diagnosis not present

## 2022-10-26 DIAGNOSIS — L97521 Non-pressure chronic ulcer of other part of left foot limited to breakdown of skin: Secondary | ICD-10-CM | POA: Diagnosis not present

## 2022-10-26 DIAGNOSIS — Z89422 Acquired absence of other left toe(s): Secondary | ICD-10-CM | POA: Insufficient documentation

## 2022-10-26 DIAGNOSIS — L97322 Non-pressure chronic ulcer of left 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: Secondary | ICD-10-CM | POA: Insufficient documentation

## 2022-10-26 DIAGNOSIS — G8221 Paraplegia, complete: Secondary | ICD-10-CM | POA: Insufficient documentation

## 2022-10-26 DIAGNOSIS — I87332 Chronic venous hypertension (idiopathic) with ulcer and inflammation of left lower extremity: Secondary | ICD-10-CM | POA: Diagnosis not present

## 2022-10-26 DIAGNOSIS — Z87891 Personal history of nicotine dependence: Secondary | ICD-10-CM | POA: Diagnosis not present

## 2022-10-26 DIAGNOSIS — G8220 Paraplegia, unspecified: Secondary | ICD-10-CM | POA: Insufficient documentation

## 2022-10-26 DIAGNOSIS — L97511 Non-pressure chronic ulcer of other part of right foot limited to breakdown of skin: Secondary | ICD-10-CM | POA: Diagnosis not present

## 2022-10-26 DIAGNOSIS — L97518 Non-pressure chronic ulcer of other part of right foot with other specified severity: Secondary | ICD-10-CM | POA: Diagnosis not present

## 2022-10-26 NOTE — Progress Notes (Signed)
[name], [name] SHEAR ([id number]) [id number] Page 1 of 18 Visit Report for 10/26/2022 Arrival Information Details Patient Name: Date of Service: [name], [name] 10/26/2022 8:30 [name] Medical Record Number: [id number] Patient Account Number: [id number] Date of Birth/Sex: Treating RN: 1986-06-13 (36 y.o. [name] Primary Care [name]: [name], [place] Other Clinician: Referring [name]: Treating [name]/Extender: [name]: 85 Visit Information History Since Last Visit Added or deleted any medications: No Patient Arrived: Wheel Chair Any new allergies or adverse reactions: No Arrival Time: 08:26 Had [name] fall or experienced change in No Accompanied By: self activities of daily living that may affect Transfer Assistance: None risk of falls: Patient Identification Verified: Yes Signs or symptoms of abuse/neglect since last visito No Secondary Verification Process Completed: Yes Hospitalized since last visit: No Patient Requires Transmission-Based Precautions: No Implantable device outside of the clinic excluding No Patient Has Alerts: Yes cellular tissue based products placed in the center Patient Alerts: R ABI = 1.0 since last visit: L ABI = 1.1 Has Dressing in Place as Prescribed: Yes Has Compression in Place as Prescribed: Yes Pain Present Now: No Electronic Signature(s) Signed: 10/26/2022 4:17:50 PM By: [name] Entered By: [name] on 10/26/2022 08:28:16 -------------------------------------------------------------------------------- Encounter Discharge Information Details Patient Name: Date of Service: [name], [name] 10/26/2022 8:30 [name] Medical Record Number: [id number] Patient Account Number: [id number] Date of Birth/Sex: Treating RN: 1986-11-05 (36 y.o. [name] Primary Care [name]: [name], [place] Other Clinician: Referring [name]: Treating Sincere Berlanga/Extender: [name]: [phone] Encounter Discharge Information Items Post Procedure Vitals Discharge Condition: Stable Temperature (F): 97.9 Ambulatory Status: Wheelchair Pulse (bpm): 128 Discharge Destination: Home Respiratory Rate (breaths/min): 18 Transportation: Private Auto Blood Pressure (mmHg): 141/82 Accompanied By: self Schedule Follow-up Appointment: Yes Clinical Summary of Care: Patient Declined Electronic Signature(s) Signed: 10/26/2022 4:17:50 PM By: [name] Entered By: [name] on 10/26/2022 09:40:48 [name], [name] ([id number]) [id number] Page 2 of 18 -------------------------------------------------------------------------------- Lower Extremity Assessment Details Patient Name: Date of Service: [name], [name] 10/26/2022 8:30 [name] Medical Record Number: [id number] Patient Account Number: [id number] Date of Birth/Sex: Treating RN: Dec 28, 1985 (36 y.o. [name] Primary Care Dorthula Bier: [name], [place] Other Clinician: Referring Benny [name]: Treating [name]/Extender: [name], [name]: 355 Edema Assessment Assessed: [Left: No] [Right: No] Edema: [Left: Yes] [Right: Yes] Calf Left: Right: Point of Measurement: 33 cm From Medial Instep 27.5 cm 31 cm Ankle Left: Right: Point of Measurement: 10 cm From Medial Instep 24.8 cm 25 cm Electronic Signature(s) Signed: 10/26/2022 4:17:50 PM By: [name] Entered By: [name] on 10/26/2022 08:33:36 -------------------------------------------------------------------------------- Multi Wound Chart Details Patient Name: Date of Service: [name], [name] 10/26/2022 8:30 [name] Medical Record Number: [id number] Patient Account Number: [id number] Date of Birth/Sex: Treating RN: [birth year] (36 y.o. [name]) Primary Care [name]: [place], [place] Other Clinician: Referring Ansh Fauble: Treating [name]/Extender: [name], [name]: 355 Vital Signs Height(in): 70 Pulse(bpm): 128 Weight(lbs): 216 Blood Pressure(mmHg): 141/82 Body Mass Index(BMI): 31 Temperature(F): 97.9 Respiratory Rate(breaths/min): 18 [52:Photos:] Right, Dorsal Foot Left, Dorsal Foot Left Upper Leg Wound Location: Gradually Appeared Gradually Appeared Pressure Injury Wounding Event: Trauma, Other Neuropathic Ulcer-Non Diabetic Pressure Ulcer Primary Etiology: Sleep Apnea, Hypertension, Paraplegia Sleep Apnea, Hypertension, Paraplegia Sleep Apnea, Hypertension, Paraplegia Comorbid History: 03/27/2021 07/11/2021 05/24/2022 Date Acquired: 84 66 21 Weeks of [name]: [name] ([id number]) [id number] Page 3 of 18 Open Open Open Wound Status: No No No  Wound Recurrence: No Yes No Clustered Wound: 0.5x3.5x0.1 0.5x1.2x0.1 6x6.2x0.1 Measurements L x W x D (cm) 1.374 0.471 29.217 [name] (cm) : rea 0.137 0.047 2.922 Volume (cm) : 27.10% 92.40% -365.00% % Reduction in Area: 27.10% 92.40% -365.30% % Reduction in Volume: Full Thickness Without Exposed Full Thickness Without Exposed Category/Stage III Classification: Support Structures Support Structures Medium Small Large Exudate [name] mount: Serosanguineous Serous Serosanguineous Exudate Type: red, brown amber red, brown Exudate Color: Flat and Intact Flat and Intact Flat and Intact Wound Margin: Medium (34-66%) Small (1-33%) Large (67-100%) Granulation [name] mount: Red Red, Pink Red, Friable Granulation Quality: Medium (34-66%) Small (1-33%) Small (1-33%) Necrotic [name] mount: Fat Layer (Subcutaneous Tissue): Yes Fat Layer (Subcutaneous Tissue): Yes Fat Layer (Subcutaneous Tissue): Yes Exposed Structures: Fascia: No Fascia: No Fascia: No Tendon: No Tendon: No Tendon: No Muscle: No Muscle: No Muscle: No Joint: No Joint: No Joint: No Bone: No Bone: No Bone: No Medium (34-66%) Large (67-100%) Small  (1-33%) Epithelialization: Debridement - Selective/Open Wound [name] Debridement - Excisional Debridement: Pre-procedure Verification/Time Out 08:54 [name] 08:54 Taken: Art therapist, Slough [name] Subcutaneous, Slough Tissue Debrided: Non-Viable Tissue [name] Skin/Subcutaneous Tissue Level: 1.75 [name] 20 Debridement [name] (sq cm): rea Curette [name] Curette Instrument: Minimum [name] Minimum Bleeding: Pressure [name] Pressure Hemostasis [name] chieved: Procedure was tolerated well [name] Procedure was tolerated well Debridement [name] Response: 0.5x3.5x0.1 [name] 6x6.2x0.1 Post Debridement Measurements L x W x D (cm) 0.137 [name] 2.922 Post Debridement Volume: (cm) [name] [name] Category/Stage III Post Debridement Stage: No Abnormalities Noted No Abnormalities Noted Excoriation: Yes Periwound Skin Texture: Scarring: Yes Dry/Scaly: Yes Dry/Scaly: Yes Maceration: Yes Periwound Skin Moisture: Maceration: No Dry/Scaly: Yes Erythema: Yes Erythema: Yes No Abnormalities Noted Periwound Skin Color: [name] [name] [name] Erythema Location: Measured: 3cm [name] [name] Erythema Measurement: [name] No Change [name] Erythema Change: No Abnormality Hot No Abnormality Temperature: Debridement [name] Debridement Procedures Performed: Wound Number: [id number] Photos: Right, Anterior Ankle Left, Lateral Lower Leg Right, Plantar Foot Wound Location: Pressure Injury Gradually Appeared Gradually Appeared Wounding Event: Venous Leg Ulcer Venous Leg Ulcer T be determined o Primary Etiology: Sleep Apnea, Hypertension, Paraplegia Sleep Apnea, Hypertension, Paraplegia Sleep Apnea, Hypertension, Paraplegia Comorbid History: 06/15/2022 06/22/2022 10/26/2022 Date Acquired: 19 17 0 Weeks of [name]: Open Open Open Wound Status: No No No Wound Recurrence: No No No Clustered Wound: 0.1x0.1x0.1 2.1x0.9x0.1 1.5x1x0.1 Measurements L x W x D (cm) 0.008 1.484 1.178 [name] (cm) : rea 0.001 0.148 0.118 Volume (cm) : 99.40% 73.50% [name] % Reduction  in [name] rea: 99.20% 73.60% [name] % Reduction in Volume: Full Thickness Without Exposed Full Thickness Without Exposed Full Thickness Without Exposed Classification: Support Structures Support Structures Support Structures Medium Medium Medium Exudate Amount: Serosanguineous Serosanguineous Serous Exudate Type: red, brown red, brown amber Exudate Color: Flat and Intact Distinct, outline attached Distinct, outline attached Wound Margin: Large (67-100%) Large (67-100%) Large (67-100%) Granulation Amount: Red, Friable Red Red Granulation Quality: Small (1-33%) None Present (0%) Small (1-33%) Necrotic Amount: Fat Layer (Subcutaneous Tissue): Yes Fat Layer (Subcutaneous Tissue): Yes Fat Layer (Subcutaneous Tissue): Yes Exposed Structures: Fascia: No Fascia: No Fascia: No Bauman, [name] ([id number]) [id number] Page 4 of 18 Tendon: No Tendon: No Tendon: No Muscle: No Muscle: No Muscle: No Joint: No Joint: No Joint: No Bone: No Bone: No Bone: No Large (67-100%) Medium (34-66%) None Epithelialization: [name] Debridement - Excisional Debridement - Selective/Open Wound Debridement: Pre-procedure Verification/Time Out [name] 08:54 08:54 Taken: [name] Subcutaneous, [facility] Tissue Debrided: [name] Skin/Subcutaneous Tissue Non-Viable Tissue Level: [name] 1.89  2.85 Debridement [name] (sq cm): rea [name] Curette Curette Instrument: [name] Minimum Minimum Bleeding: [name] Pressure Pressure Hemostasis [name] chieved: [name] Procedure was tolerated well Procedure was tolerated well Debridement [name] Response: [name] 2.1x0.9x0.1 1.5x1x0.1 Post Debridement Measurements L x W x D (cm) [name] 0.148 0.118 Post Debridement Volume: (cm) [name] [name] [name] Post Debridement Stage: No Abnormalities Noted No Abnormalities Noted No Abnormalities Noted Periwound Skin Texture: No Abnormalities Noted Dry/Scaly: Yes Dry/Scaly: Yes Periwound Skin Moisture: Erythema: Yes Erythema: Yes No Abnormalities  Noted Periwound Skin Color: [name] Red Streaks [name] Erythema Location: Measured: 1.5cm [name] [name] Erythema Measurement: [name] [name] [name] Erythema Change: No Abnormality No Abnormality No Abnormality Temperature: [name] Debridement Debridement Procedures Performed: Wound Number: 70 [name] [name] Photos: [name] [name] Left, Plantar Foot [name] [name] Wound Location: Gradually Appeared [name] [name] Wounding Event: T be determined o [name] [name] Primary Etiology: Sleep Apnea, Hypertension, Paraplegia [name] [name] Comorbid History: 10/26/2022 [name] [name] Date Acquired: 0 [name] [name] Weeks of [name]: Open [name] [name] Wound Status: No [name] [name] Wound Recurrence: No [name] [name] Clustered Wound: 0.6x1.9x0.1 [name] [name] Measurements L x W x D (cm) 0.895 [name] [name] [name] (cm) : rea 0.09 [name] [name] Volume (cm) : [name] [name] [name] % Reduction in [name] rea: [name] [name] [name] % Reduction in Volume: Full Thickness Without Exposed [name] [name] Classification: Support Structures Medium [name] [name] Exudate [name] mount: Serous [name] [name] Exudate Type: amber [name] [name] Exudate Color: Distinct, outline attached [name] [name] Wound Margin: Large (67-100%) [name] [name] Granulation [name] mount: Pink [name] [name] Granulation Quality: None Present (0%) [name] [name] Necrotic [name] mount: Fat Layer (Subcutaneous Tissue): Yes [name] [name] Exposed Structures: Fascia: No Tendon: No Muscle: No Joint: No Bone: No None [name] [name] Epithelialization: Debridement - Selective/Open Wound [name] [name] Debridement: Pre-procedure Verification/Time Out 08:54 [name] [name] Taken: [name] [name] [name] Tissue Debrided: Skin/Epidermis [name] [name] Level: 1.71 [name] [name] Debridement [name] (sq cm): rea Curette [name] [name] Instrument: Minimum [name] [name] Bleeding: Pressure [name] [name] Hemostasis [name] chieved: Procedure was tolerated well [name] [name] Debridement [name] Response: 0.6x1.9x0.1 [name] [name] Post Debridement Measurements L x W x D (cm) 0.09 [name] [name] Post Debridement Volume: (cm) [name] [name] [name] Post Debridement Stage: Callus: Yes [name] [name] Periwound Skin  Texture: No Abnormalities Noted [name] [name] Periwound Skin Moisture: [name], [name] ([id number]) [id number] Page 5 of 18 Erythema: Yes [name] [name] Periwound Skin Color: Red Streaks [name] [name] Erythema Location: [name] [name] [name] Erythema Measurement: [name] [name] [name] Erythema Change: No Abnormality [name] [name] Temperature: Debridement [name] [name] Procedures Performed: [name] Notes Electronic Signature(s) Signed: 10/26/2022 9:38:58 AM By: Fredirick Maudlin MD FACS Entered By: Fredirick Maudlin on 10/26/2022 09:38:57 -------------------------------------------------------------------------------- Multi-Disciplinary Care Plan Details Patient Name: Date of Service: [place], [name] 10/26/2022 8:30 [name] Medical Record Number: [id number] Patient Account Number: [id number] Date of Birth/Sex: Treating RN: Sep 03, 1986 (36 y.o. [name] Primary Care Meekah Math: [name], [name] Other Clinician: Referring Keaton Stirewalt: Treating Kashmir Lysaght/Extender: [name]: [place] reviewed with physician Active Inactive Pressure Nursing Diagnoses: Knowledge deficit related to management of pressures ulcers Potential for impaired tissue integrity related to pressure, friction, moisture, and shear Goals: Patient will remain free from development of additional pressure ulcers Date Initiated: 06/21/2018 Date Inactivated: 02/12/2019 Target Resolution Date: 02/16/2019 Unmet Reason: pressure ulcer Goal Status: Unmet reopened on left ischium Patient/caregiver will verbalize risk factors for pressure ulcer development Date Initiated: 06/21/2018 Date Inactivated: 05/28/2019 Target Resolution Date: 06/01/2019 Goal Status: Met Patient/caregiver will verbalize understanding of pressure ulcer  management Date Initiated: 06/01/2022 Target Resolution Date: 12/24/2022 Goal Status: Active Interventions: Assess: immobility, friction, shearing, incontinence upon  admission and as needed Assess offloading mechanisms upon admission and as needed Assess potential for pressure ulcer upon admission and as needed [name] Activities: Pressure reduction/relief device ordered : 12/05/2017 Notes: Wound/Skin Impairment Nursing Diagnoses: Impaired tissue integrity Knowledge deficit related to ulceration/compromised skin integrity Goals: Patient/caregiver will verbalize understanding of skin care regimen Date Initiated: 06/01/2022 Target Resolution Date: 12/24/2022 Goal Status: Active Ulcer/skin breakdown will have [name] volume reduction of 30% by week 4 Date Initiated: 06/01/2022 Date Inactivated: 06/29/2022 Target Resolution Date: 06/29/2022 [name], POFFENBERGER ([id number]) [phone].pdf Page 6 of 18 Unmet Reason: complex wounds, Goal Status: Unmet infection Interventions: Assess patient/caregiver ability to obtain necessary supplies Assess ulceration(s) every visit Provide education on ulcer and skin care Notes: 02/02/21: Complex Care, ongoing. Electronic Signature(s) Signed: 10/26/2022 4:17:50 PM By: Sabas Sous By: [name] on 10/26/2022 09:39:42 -------------------------------------------------------------------------------- Pain Assessment Details Patient Name: Date of Service: Shadduck, [name] 10/26/2022 8:30 [name] Medical Record Number: [id number] Patient Account Number: [id number] Date of Birth/Sex: Treating RN: 1986-07-14 (36 y.o. [name] Primary Care Christhoper Busbee: [place], [place] Other Clinician: Referring Journii Nierman: Treating Teshia Mahone/Extender: [name]: 355 Active Problems Location of Pain Severity and Description of Pain Patient Has Paino No Site Locations Rate the pain. Current Pain Level: 0 Pain Management and Medication Current Pain Management: Electronic Signature(s) Signed: 10/26/2022 4:17:50 PM By: [name] Entered By: [name] on 10/26/2022 08:28:27 -------------------------------------------------------------------------------- Patient/Caregiver Education Details Patient Name: Date of Service: Shader, [name] 12/5/2023andnbsp8:30 [name] Medical Record Number: [id number] Patient Account Number: [id number] CRIBBZackery, Brine ([id number]) [id number] Page 7 of 18 Date of Birth/Gender: Treating RN: [birth year] (36 y.o. [name] Primary Care Physician: Janine Limbo Other Clinician: Referring Physician: Treating Physician/Extender: [name]: [place] Education Assessment Education Provided To: Patient Education Topics Provided Wound/Skin Impairment: Methods: Explain/Verbal Responses: Reinforcements needed, State content correctly Electronic Signature(s) Signed: 10/26/2022 4:17:50 PM By: [name] Entered By: [name] on 10/26/2022 09:39:59 -------------------------------------------------------------------------------- Wound Assessment Details Patient Name: Date of Service: [place], [name] 10/26/2022 8:30 [name] Medical Record Number: [id number] Patient Account Number: [id number] Date of Birth/Sex: Treating RN: 04-Feb-1986 (36 y.o. [name] Primary Care Mersades Barbaro: [place], [place] Other Clinician: Referring Johnie Stadel: Treating Braedyn Riggle/Extender: [name], [name]: 355 Wound Status Wound Number: [id number] Primary Etiology: Trauma, Other Wound Location: Right, Dorsal Foot Wound Status: Open Wounding Event: Gradually Appeared Comorbid History: Sleep Apnea, Hypertension, Paraplegia Date Acquired: 03/27/2021 Weeks Of [name]: 82 Clustered Wound: No Photos Wound Measurements Length: (cm) Width: (cm) Depth: (cm) Area: (cm) Volume: (cm) 0.5 % Reduction in Area: 27.1% 3.5 % Reduction in Volume: 27.1% 0.1 Epithelialization: Medium (34-66%) 1.374 Tunneling: No 0.137 Undermining:  No Wound Description Classification: Full Thickness Without Exposed Support Structures Wound Margin: Flat and Intact Exudate Amount: Medium Exudate Type: Serosanguineous Exudate Color: red, brown Hertzog, Abundio [name] ([id number]) Foul Odor After Cleansing: No Slough/Fibrino Yes [id number] Page 8 of 18 Wound Bed Granulation Amount: Medium (34-66%) Exposed Structure Granulation Quality: Red Fascia Exposed: No Necrotic Amount: Medium (34-66%) Fat Layer (Subcutaneous Tissue) Exposed: Yes Necrotic Quality: Adherent Slough Tendon Exposed: No Muscle Exposed: No Joint Exposed: No Bone Exposed: No Periwound Skin Texture Texture Color No Abnormalities Noted: Yes No Abnormalities Noted: No Erythema: Yes Moisture Erythema Measurement: Measured No Abnormalities Noted: No 3 cm Dry / Scaly: Yes Temperature /  Pain Temperature: No Abnormality [name] Notes Wound #52 (Foot) Wound Laterality: Dorsal, Right Cleanser Soap and Water Discharge Instruction: May shower and wash wound with dial antibacterial soap and water prior to dressing change. Wound Cleanser Discharge Instruction: Cleanse the wound with wound cleanser prior to applying [name] clean dressing using gauze sponges, not tissue or cotton balls. Peri-Wound Care Sween Lotion (Moisturizing lotion) Discharge Instruction: Apply Aquaphor moisturizing lotion as directed Topical Primary Dressing KerraCel Ag Gelling Fiber Dressing, 2x2 in (silver alginate) Discharge Instruction: Apply silver alginate to wound bed as instructed Secondary Dressing Woven Gauze Sponge, Non-Sterile 4x4 in Discharge Instruction: Apply over primary dressing as directed. Secured With [employer], 4.5x3.1 (in/yd) Discharge Instruction: Secure with Kerlix as directed. 45M Medipore H Soft Cloth Surgical T ape, 4 x 10 (in/yd) Discharge Instruction: Secure with tape as directed. Compression Wrap Compression Stockings Circaid Juxta  Lite Compression Wrap Quantity: 1 Right Leg Compression Amount: 30-40 mmHg Discharge Instruction: Apply Circaid Juxta Lite Compression Wrap daily as instructed. Apply first thing in the morning, remove at night before bed. Add-Ons Electronic Signature(s) Signed: 10/26/2022 4:17:50 PM By: [name] Entered By: [name] on 10/26/2022 08:44:40 Wound Assessment Details -------------------------------------------------------------------------------- [name] ([id number]) [id number] Page 9 of 18 Patient Name: Date of Service: [place], [name] 10/26/2022 8:30 [name] Medical Record Number: [id number] Patient Account Number: [id number] Date of Birth/Sex: Treating RN: Apr 03, 1986 (36 y.o. [name] Primary Care Latasha Puskas: [name], [name] Other Clinician: Referring Siddharth Babington: Treating Stephie Xu/Extender: [name], [name]: 355 Wound Status Wound Number: 56 Primary Etiology: Neuropathic Ulcer-Non Diabetic Wound Location: Left, Dorsal Foot Wound Status: Open Wounding Event: Gradually Appeared Comorbid History: Sleep Apnea, Hypertension, Paraplegia Date Acquired: 07/11/2021 Weeks Of [name]: 66 Clustered Wound: Yes Photos Wound Measurements Length: (cm) 0.5 % Reduction in Area: 92.4% Width: (cm) 1.2 % Reduction in Volume: 92.4% Depth: (cm) 0.1 Epithelialization: Large (67-100%) Area: (cm) 0.471 Tunneling: No Volume: (cm) 0.047 Undermining: No Wound Description Classification: Full Thickness Without Exposed Support Structures Foul Odor After Cleansing: No Wound Margin: Flat and Intact Slough/Fibrino Yes Exudate Amount: Small Exudate Type: Serous Exudate Color: amber Wound Bed Granulation Amount: Small (1-33%) Exposed Structure Granulation Quality: Red, Pink Fascia Exposed: No Necrotic Amount: Small (1-33%) Fat Layer (Subcutaneous Tissue) Exposed: Yes Necrotic Quality: Adherent Slough Tendon  Exposed: No Muscle Exposed: No Joint Exposed: No Bone Exposed: No Periwound Skin Texture Texture Color No Abnormalities Noted: Yes No Abnormalities Noted: No Erythema: Yes Moisture Erythema Change: No Change No Abnormalities Noted: No Dry / Scaly: Yes Temperature / Pain Maceration: No Temperature: Hot [name] Notes Wound #56 (Foot) Wound Laterality: Dorsal, Left Cleanser Soap and Water Discharge Instruction: May shower and wash wound with dial antibacterial soap and water prior to dressing change. Peri-Wound Care Sween Lotion (Moisturizing lotion) Discharge Instruction: Apply Aquaphor moisturizing lotion as directed Topical [name], [name] ([id number]) [id number] Page 10 of 18 Primary Dressing KerraCel Ag Gelling Fiber Dressing, 2x2 in (silver alginate) Discharge Instruction: Apply silver alginate to wound bed as instructed Secondary Dressing Woven Gauze Sponge, Non-Sterile 4x4 in Discharge Instruction: Apply over primary dressing as directed. Secured With [employer], 4.5x3.1 (in/yd) Discharge Instruction: Secure with Kerlix as directed. 45M Medipore H Soft Cloth Surgical T ape, 4 x 10 (in/yd) Discharge Instruction: Secure with tape as directed. Compression Wrap Compression Stockings Add-Ons Electronic Signature(s) Signed: 10/26/2022 10:37:16 AM By: Worthy Rancher Signed: 10/26/2022 4:17:50 PM By: [name] Entered By: Worthy Rancher on 10/26/2022 08:44:49 -------------------------------------------------------------------------------- Wound Assessment Details  Patient Name: Date of Service: [place], [name] 10/26/2022 8:30 [name] Medical Record Number: [id number] Patient Account Number: [id number] Date of Birth/Sex: Treating RN: [birth year] (36 y.o. [name] Primary Care Ellanora Rayborn: [place], [place] Other Clinician: Referring Esther Bradstreet: Treating Suhaila Troiano/Extender: [name], [name]: 355 Wound  Status Wound Number: 65 Primary Etiology: Pressure Ulcer Wound Location: Left Upper Leg Wound Status: Open Wounding Event: Pressure Injury Comorbid History: Sleep Apnea, Hypertension, Paraplegia Date Acquired: 05/24/2022 Weeks Of [name]: 21 Clustered Wound: No Photos Wound Measurements Length: (cm) 6 Width: (cm) 6.2 Depth: (cm) 0.1 Area: (cm) 29.217 Volume: (cm) 2.922 % Reduction in Area: -365% % Reduction in Volume: -365.3% Epithelialization: Small (1-33%) Tunneling: No Undermining: No Wound Description Classification: Category/Stage III Wound Margin: Flat and Intact [name], [name] ([id number]) Exudate Amount: Large Exudate Type: Serosanguineous Exudate Color: red, brown Foul Odor After Cleansing: No Slough/Fibrino Yes [id number].pdf Page 11 of 18 Wound Bed Granulation Amount: Large (67-100%) Exposed Structure Granulation Quality: Red, Friable Fascia Exposed: No Necrotic Amount: Small (1-33%) Fat Layer (Subcutaneous Tissue) Exposed: Yes Necrotic Quality: Adherent Slough Tendon Exposed: No Muscle Exposed: No Joint Exposed: No Bone Exposed: No Periwound Skin Texture Texture Color No Abnormalities Noted: No No Abnormalities Noted: Yes Excoriation: Yes Temperature / Pain Scarring: Yes Temperature: No Abnormality Moisture No Abnormalities Noted: No Dry / Scaly: Yes Maceration: Yes [name] Notes Wound #65 (Upper Leg) Wound Laterality: Left Cleanser Soap and Water Discharge Instruction: May shower and wash wound with dial antibacterial soap and water prior to dressing change. Peri-Wound Care Zinc Oxide Ointment 30g tube Discharge Instruction: Apply Zinc Oxide to periwound with each dressing change Topical Keystone antibiotic compound Discharge Instruction: thin layer to wound bed, use daily once available Primary Dressing Cutimed Sorbact Swab Discharge Instruction: Apply to wound bed as instructed Secondary Dressing Zetuvit Plus  Silicone Border Dressing 7x7(in/in) Discharge Instruction: Apply silicone border over primary dressing as directed. Secured With Compression Wrap Compression Stockings Add-Ons Electronic Signature(s) Signed: 10/26/2022 10:37:16 AM By: Worthy Rancher Signed: 10/26/2022 4:17:50 PM By: [name] Entered By: Worthy Rancher on 10/26/2022 08:47:13 -------------------------------------------------------------------------------- Wound Assessment Details Patient Name: Date of Service: Dains, [name] 10/26/2022 8:30 [name] Medical Record Number: [id number] Patient Account Number: [id number] Date of Birth/Sex: Treating RN: 1985/12/22 (35 y.o. [name] Primary Care Avi Kerschner: [facility], [place] Other Clinician: Referring Darian Cansler: Treating Sumner [name]/Extender: [name], Tanaina, [name] ([id number]) [id number] Page 12 of 18 Weeks in [name]: 355 Wound Status Wound Number: 66 Primary Etiology: Venous Leg Ulcer Wound Location: Right, Anterior Ankle Wound Status: Open Wounding Event: Pressure Injury Comorbid History: Sleep Apnea, Hypertension, Paraplegia Date Acquired: 06/15/2022 Weeks Of [name]: 19 Clustered Wound: No Photos Wound Measurements Length: (cm) 0.1 Width: (cm) 0.1 Depth: (cm) 0.1 Area: (cm) 0.008 Volume: (cm) 0.001 % Reduction in Area: 99.4% % Reduction in Volume: 99.2% Epithelialization: Large (67-100%) Tunneling: No Undermining: No Wound Description Classification: Full Thickness Without Exposed Support Structures Wound Margin: Flat and Intact Exudate Amount: Medium Exudate Type: Serosanguineous Exudate Color: red, brown Foul Odor After Cleansing: No Slough/Fibrino Yes Wound Bed Granulation Amount: Large (67-100%) Exposed Structure Granulation Quality: Red, Friable Fascia Exposed: No Necrotic Amount: Small (1-33%) Fat Layer (Subcutaneous Tissue) Exposed: Yes Necrotic Quality: Adherent Slough Tendon Exposed:  No Muscle Exposed: No Joint Exposed: No Bone Exposed: No Periwound Skin Texture Texture Color No Abnormalities Noted: Yes No Abnormalities Noted: No Erythema: Yes Moisture Erythema Measurement: Measured No Abnormalities Noted: Yes 1.5 cm Temperature / Pain Temperature: No  Abnormality [name] Notes Wound #66 (Ankle) Wound Laterality: Right, Anterior Cleanser Soap and Water Discharge Instruction: May shower and wash wound with dial antibacterial soap and water prior to dressing change. Peri-Wound Care Sween Lotion (Moisturizing lotion) Discharge Instruction: Apply Aquaphor moisturizing lotion as directed Topical Primary Dressing KerraCel Ag Gelling Fiber Dressing, 2x2 in (silver alginate) Discharge Instruction: Apply silver alginate to wound bed as instructed [name], [name] ([id number]) [id number] Page 13 of 18 Secondary Dressing Secured With [employer], 4.5x3.1 (in/yd) Discharge Instruction: Secure with Kerlix as directed. 19M Medipore H Soft Cloth Surgical T ape, 4 x 10 (in/yd) Discharge Instruction: Secure with tape as directed. Compression Wrap Compression Stockings Add-Ons Electronic Signature(s) Signed: 10/26/2022 10:37:16 AM By: Worthy Rancher Signed: 10/26/2022 4:17:50 PM By: [name] Entered By: Worthy Rancher on 10/26/2022 08:43:31 -------------------------------------------------------------------------------- Wound Assessment Details Patient Name: Date of Service: [place], [name] 10/26/2022 8:30 [name] Medical Record Number: [id number] Patient Account Number: [id number] Date of Birth/Sex: Treating RN: 21-Apr-1986 (36 y.o. [name] Primary Care Myshawn Chiriboga: [place], [place] Other Clinician: Referring Darshawn Boateng: Treating Rilynn Habel/Extender: [name], [name]: 355 Wound Status Wound Number: 67 Primary Etiology: Venous Leg Ulcer Wound Location: Left, Lateral Lower Leg Wound Status:  Open Wounding Event: Gradually Appeared Comorbid History: Sleep Apnea, Hypertension, Paraplegia Date Acquired: 06/22/2022 Weeks Of [name]: 17 Clustered Wound: No Photos Wound Measurements Length: (cm) Width: (cm) Depth: (cm) Area: (cm) Volume: (cm) 2.1 % Reduction in Area: 73.5% 0.9 % Reduction in Volume: 73.6% 0.1 Epithelialization: Medium (34-66%) 1.484 Tunneling: No 0.148 Undermining: No Wound Description Classification: Full Thickness Without Exposed Suppo Wound Margin: Distinct, outline attached Exudate Amount: Medium Exudate Type: Serosanguineous Exudate Color: red, brown rt Structures Foul Odor After Cleansing: No Slough/Fibrino No Wound Bed [name], [name] ([id number]) [id number] Page 14 of 18 Granulation Amount: Large (67-100%) Exposed Structure Granulation Quality: Red Fascia Exposed: No Necrotic Amount: None Present (0%) Fat Layer (Subcutaneous Tissue) Exposed: Yes Tendon Exposed: No Muscle Exposed: No Joint Exposed: No Bone Exposed: No Periwound Skin Texture Texture Color No Abnormalities Noted: Yes No Abnormalities Noted: No Erythema: Yes Moisture Erythema Location: Red Streaks No Abnormalities Noted: No Dry / Scaly: Yes Temperature / Pain Temperature: No Abnormality [name] Notes Wound #67 (Lower Leg) Wound Laterality: Left, Lateral Cleanser Soap and Water Discharge Instruction: May shower and wash wound with dial antibacterial soap and water prior to dressing change. Peri-Wound Care Triamcinolone 15 (g) Discharge Instruction: Use triamcinolone 15 (g) as directed Sween Lotion (Moisturizing lotion) Discharge Instruction: Apply Aquaphor moisturizing lotion as directed Topical Mupirocin Ointment Discharge Instruction: Apply Mupirocin (Bactroban) as instructed Primary Dressing KerraCel Ag Gelling Fiber Dressing, 2x2 in (silver alginate) Discharge Instruction: Apply silver alginate to wound bed as  instructed Secondary Dressing Woven Gauze Sponge, Non-Sterile 4x4 in Discharge Instruction: Apply over primary dressing as directed. Secured With [employer], 4.5x3.1 (in/yd) Discharge Instruction: Secure with Kerlix as directed. 19M Medipore H Soft Cloth Surgical T ape, 4 x 10 (in/yd) Discharge Instruction: Secure with tape as directed. Compression Wrap Compression Stockings Add-Ons Electronic Signature(s) Signed: 10/26/2022 10:37:16 AM By: Worthy Rancher Signed: 10/26/2022 4:17:50 PM By: [name] Entered By: Worthy Rancher on 10/26/2022 08:46:03 -------------------------------------------------------------------------------- Wound Assessment Details Patient Name: Date of Service: [place], [name] 10/26/2022 8:30 [name] Medical Record Number: [id number] Patient Account Number: [id number] Date of Birth/Sex: Treating RN: Jul 15, 1986 (36 y.o. [name] Primary Care Rain Wilhide: [place], [place] Other Clinician: Referring Jearlene Bridwell: Treating Cordney Barstow/Extender: Fredirick Maudlin Bosque, [name], [name] (  [id number]) [id number] Page 15 of 18 Weeks in [name]: 355 Wound Status Wound Number: 69 Primary Etiology: T be determined o Wound Location: Right, Plantar Foot Wound Status: Open Wounding Event: Gradually Appeared Comorbid History: Sleep Apnea, Hypertension, Paraplegia Date Acquired: 10/26/2022 Weeks Of [name]: 0 Clustered Wound: No Photos Wound Measurements Length: (cm) 1.5 Width: (cm) 1 Depth: (cm) 0.1 Area: (cm) 1.178 Volume: (cm) 0.118 % Reduction in Area: % Reduction in Volume: Epithelialization: None Tunneling: No Undermining: No Wound Description Classification: Full Thickness Without Exposed Suppo Wound Margin: Distinct, outline attached Exudate Amount: Medium Exudate Type: Serous Exudate Color: amber rt Structures Foul Odor After Cleansing: No Slough/Fibrino Yes Wound Bed Granulation Amount: Large (67-100%)  Exposed Structure Granulation Quality: Red Fascia Exposed: No Necrotic Amount: Small (1-33%) Fat Layer (Subcutaneous Tissue) Exposed: Yes Necrotic Quality: Adherent Slough Tendon Exposed: No Muscle Exposed: No Joint Exposed: No Bone Exposed: No Periwound Skin Texture Texture Color No Abnormalities Noted: Yes No Abnormalities Noted: Yes Moisture Temperature / Pain No Abnormalities Noted: No Temperature: No Abnormality Dry / Scaly: Yes [name] Notes Wound #69 (Foot) Wound Laterality: Plantar, Right Cleanser Soap and Water Discharge Instruction: May shower and wash wound with dial antibacterial soap and water prior to dressing change. Peri-Wound Care Sween Lotion (Moisturizing lotion) Discharge Instruction: Apply Aquaphor moisturizing lotion as directed Topical Primary Dressing KerraCel Ag Gelling Fiber Dressing, 2x2 in (silver alginate) Discharge Instruction: Apply silver alginate to wound bed as instructed Secondary Dressing Newby, Wagner [name] ([id number]) [id number] Page 16 of 18 Secured With [employer], 4.5x3.1 (in/yd) Discharge Instruction: Secure with Kerlix as directed. 42M Medipore H Soft Cloth Surgical T ape, 4 x 10 (in/yd) Discharge Instruction: Secure with tape as directed. Compression Wrap Compression Stockings Add-Ons Electronic Signature(s) Signed: 10/26/2022 10:37:16 AM By: Worthy Rancher Signed: 10/26/2022 4:17:50 PM By: [name] Entered By: Worthy Rancher on 10/26/2022 08:48:07 -------------------------------------------------------------------------------- Wound Assessment Details Patient Name: Date of Service: Bosshart, [name] 10/26/2022 8:30 [name] Medical Record Number: [id number] Patient Account Number: [id number] Date of Birth/Sex: Treating RN: February 12, 1986 (36 y.o. [name] Primary Care Issis Lindseth: [place], [name] Other Clinician: Referring Peirce Deveney: Treating Phelan Goers/Extender: [name],  [name]: 355 Wound Status Wound Number: 70 Primary Etiology: T be determined o Wound Location: Left, Plantar Foot Wound Status: Open Wounding Event: Gradually Appeared Comorbid History: Sleep Apnea, Hypertension, Paraplegia Date Acquired: 10/26/2022 Weeks Of [name]: 0 Clustered Wound: No Photos Wound Measurements Length: (cm) 0.6 Width: (cm) 1.9 Depth: (cm) 0.1 Area: (cm) 0.895 Volume: (cm) 0.09 % Reduction in Area: % Reduction in Volume: Epithelialization: None Tunneling: No Undermining: No Wound Description Classification: Full Thickness Without Exposed Suppo Wound Margin: Distinct, outline attached Exudate Amount: Medium Exudate Type: Serous Exudate Color: amber rt Structures Foul Odor After Cleansing: No Slough/Fibrino No Wound Bed Granulation Amount: Large (67-100%) Exposed Structure Granulation Quality: Pink Fascia Exposed: No Necrotic Amount: None Present (0%) Fat Layer (Subcutaneous Tissue) Exposed: Yes Rey, Santiago [name] ([id number]) [id number] Page 17 of 18 Tendon Exposed: No Muscle Exposed: No Joint Exposed: No Bone Exposed: No Periwound Skin Texture Texture Color No Abnormalities Noted: No No Abnormalities Noted: No Callus: Yes Erythema: Yes Erythema Location: Red Streaks Moisture No Abnormalities Noted: Yes Temperature / Pain Temperature: No Abnormality [name] Notes Wound #70 (Foot) Wound Laterality: Plantar, Left Cleanser Soap and Water Discharge Instruction: May shower and wash wound with dial antibacterial soap and water prior to dressing change. Peri-Wound Care Sween Lotion (Moisturizing lotion) Discharge Instruction: Apply Aquaphor moisturizing lotion as directed Topical  Primary Dressing KerraCel Ag Gelling Fiber Dressing, 2x2 in (silver alginate) Discharge Instruction: Apply silver alginate to wound bed as instructed Secondary Dressing Secured With Kerlix Roll Sterile, 4.5x3.1  (in/yd) Discharge Instruction: Secure with Kerlix as directed. 65M Medipore H Soft Cloth Surgical T ape, 4 x 10 (in/yd) Discharge Instruction: Secure with tape as directed. Compression Wrap Compression Stockings Add-Ons Electronic Signature(s) Signed: 10/26/2022 10:37:16 AM By: Worthy Rancher Signed: 10/26/2022 4:17:50 PM By: [name] Entered By: Worthy Rancher on 10/26/2022 08:49:37 -------------------------------------------------------------------------------- Vitals Details Patient Name: Date of Service: Wlodarczyk, [name] 10/26/2022 8:30 [name] Medical Record Number: [id number] Patient Account Number: [id number] Date of Birth/Sex: Treating RN: [birth year] (36 y.o. [name] Primary Care Claudina Oliphant: [place], [place] Other Clinician: Referring Janye Maynor: Treating Gertrude Tarbet/Extender: [name], [name]: 355 Vital Signs Time Taken: 08:28 Temperature (F): 97.9 Height (in): 70 Pulse (bpm): 128 Weight (lbs): 216 Respiratory Rate (breaths/min): 18 Body Mass Index (BMI): 31 Blood Pressure (mmHg): 141/82 Reference Range: 80 - 120 mg / dl Tutson, Artem [name] ([id number]) [id number] Page 18 of 18 Electronic Signature(s) Signed: 10/26/2022 4:17:50 PM By: [name] Entered By: [name] on 10/26/2022 08:29:47

## 2022-10-26 NOTE — Progress Notes (Signed)
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 week 01/03/17; the patient had concerning areas of erythema last week, already on doxycycline for UTI through his primary doctor. The erythema is absolutely no better there is warmth and swelling both medially from the left lateral leg wound and also the dorsal left foot. 01/06/17- Patient is here for follow-up evaluation of his left lateral leg ulcer and bilateral feet ulcers. He is on oral antibiotic therapy, tolerating that. Nursing staff and the patient states that the erythema is improved from Monday. 01/13/17; the predominant left lateral leg wound continues to be problematic. I had put Apligraf on him earlier this month once. However he subsequently developed what appeared to be an intense cellulitis around the left lateral leg wound. I gave him Dalvance I think on 2/12 perhaps 2/13 he continues on cefdinir. The erythema is still present but the warmth and swelling is improved. I  am hopeful that the cellulitis part of this control. I wouldn't be surprised if there is an element of venous inflammation as well. 01/17/17. The erythema is present but better in the left leg. His left lateral leg wound still does not have a viable surface buttons certain parts of this long thin wound it appears like there has been improvement in dimensions. 01/20/17; the erythema still present but much better in the left leg. I'm thinking this is his usual degree of chronic venous inflammation. The wound on the left leg looks somewhat better. Is less surface slough 01/27/17; erythema is back to the chronic venous inflammation. The wound on the left leg is somewhat better. I am back to the point where I like to try an Apligraf once again 02/10/17; slight improvement in wound dimensions. Apligraf #2. He is completing his doxycycline 02/14/17; patient arrives today having completed doxycycline last Thursday. This was supposed to be a nurse visit however once again he hasn't tense erythema from the medial part of his wound extending over the lower leg. Also erythema in his foot this is roughly in the same distribution as last time. He has baseline chronic venous inflammation however this is a lot worse than the baseline I have learned to accept the on him is baseline inflammation 02/24/17- patient is here for follow-up evaluation. He is tolerating compression therapy. His voicing no complaints or concerns he is here anticipating an Apligraf 03/03/17; he arrives today with an adherent necrotic surface. I don't think this is surface is going to be amenable for Apligraf's.  The erythema around his wound and on the left dorsal foot has resolved he is off antibiotics 03/10/17; better-looking surface today. I don't think he can tolerate Apligraf's. He tells me he had a wound VAC after a skin graft years ago to this area and they had difficulty with a seal. The erythema continues to be stable around this some degree of  chronic venous inflammation but he also has recurrent cellulitis. We have been using Iodoflex 03/17/17; continued improvement in the surface and may be small changes in dimensions. Using Iodoflex which seems the only thing that will control his surface 03/24/17- He is here for follow up evaluation of his LLE lateral ulceration and ulcer to right dorsal foot/toe space. He is voicing no complaints or concerns, He is tolerating compression wrap. 03/31/17 arrives today with a much healthier looking wound on the left lower extremity. We have been using Iodoflex for a prolonged period of time which has for the first time prepared and adequate looking wound bed although we have not had much in the way of wound dimension improvement. He also has a small wound between the first and second toe on the right 04/07/17; arrives today with a healthy-looking wound bed and at least the top 50% of this wound appears to be now her. No debridement was required I have changed him to [facility] last week after prolonged Iodoflex. He did not do well with Apligraf's. We've had a re-opening between the first and second toe on the right 04/14/17; arrives today with a healthier looking wound bed contractions and the top 50% of this wound and some on the lesser 50%. Wound bed appears healthy. The area between the first and second toe on the right still remains problematic 04/21/17; continued very gradual improvement. Using [facility] 04/28/17; continued very gradual improvement in the left lateral leg venous insufficiency wound. His periwound erythema is very mild. We have been using Hydrofera Blue. Wound is making progress especially in the superior 50% 05/05/17; he continues to have very gradual improvement in the left lateral venous insufficiency wound. Both in terms with an length rings are improving. I debrided this every 2 weeks with #5 curet and we have been using Hydrofera Blue and again making good progress With  regards to the wounds between his right first and second toe which I thought might of been tinea pedis he is not making as much progress very dry scaly skin over the area. Also the area at the base of the left first and second toe in a similar condition 05/12/17; continued gradual improvement in the refractory left lateral venous insufficiency wound on the left. Dimension smaller. Surface still requiring Barbe, Jerrelle E ([id number]) [id number].pdf Page 7 of 39 debridement using Hydrofera Blue 05/19/17; continued gradual improvement in the refractory left lateral venous ulceration. Careful inspection of the wound bed underlying rumination suggested some degree of epithelialization over the surface no debridement indicated. Continue Hydrofera Blue difficult areas between his toes first and third on the left than first and second on the right. I'm going to change to silver alginate from silver collagen. Continue ketoconazole as I suspect underlying tinea pedis 05/26/17; left lateral leg venous insufficiency wound. We've been using Hydrofera Blue. I believe that there is expanding epithelialization over the surface of the wound albeit not coming from the wound circumference. This is a bit of an odd situation in which the epithelialization seems to be coming from the surface of the wound rather than in the exact circumference.  There is still small open areas mostly along the lateral margin of the wound. He has unchanged areas between the left first and second and the right first second toes which I been treating for tenia pedis 06/02/17; left lateral leg venous insufficiency wound. We have been using Hydrofera Blue. Somewhat smaller from the wound circumference. The surface of the wound remains a bit on it almost epithelialized sedation in appearance. I use an open curette today debridement in the surface of all of this especially the edges Small open wounds remaining on the dorsal right  first and second toe interspace and the plantar left first second toe and her face on the left 06/09/17; wound on the left lateral leg continues to be smaller but very gradual and very dry surface using Hydrofera Blue 06/16/17 requires weekly debridements now on the left lateral leg although this continues to contract. I changed to silver collagen last week because of dryness of the wound bed. Using Iodoflex to the areas on his first and second toes/web space bilaterally 06/24/17; patient with history of paraplegia also chronic venous insufficiency with lymphedema. Has a very difficult wound on the left lateral leg. This has been gradually reducing in terms of with but comes in with a very dry adherent surface. High switch to silver collagen a week or so ago with hydrogel to keep the area moist. This is been refractory to multiple dressing attempts. He also has areas in his first and second toes bilaterally in the anterior and posterior web space. I had been using Iodoflex here after a prolonged course of silver alginate with ketoconazole was ineffective [question tinea pedis] 07/14/17; patient arrives today with a very difficult adherent material over his left lateral lower leg wound. He also has surrounding erythema and poorly controlled edema. He was switched his Santyl last visit which the nurses are applying once during his doctor visit and once on a nurse visit. He was also reduced to 2 layer compression I'm not exactly sure of the issue here. 07/21/17; better surface today after 1 week of Iodoflex. Significant cellulitis that we treated last week also better. [Doxycycline] 07/28/17 better surface today with now 2 weeks of Iodoflex. Significant cellulitis treated with doxycycline. He has now completed the doxycycline and he is back to his usual degree of chronic venous inflammation/stasis dermatitis. He reminds me he has had ablations surgery here 08/04/17; continued improvement with Iodoflex to the left  lateral leg wound in terms of the surface of the wound although the dimensions are better. 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 Problematic areas on the plantar aspect of the first second toe web space on the left and the dorsal aspect of the first second toe web space on the right. At one point I felt these were probably related to chronic fungal infections in treated him aggressively for this although we have not made any improvement here. 08/11/17; left lateral leg. Surface continues to improve with the Iodoflex although we are not seeing much improvement in overall wound dimensions. Areas on his plantar left foot and right foot show no improvement. In fact the right foot looks somewhat worse 08/18/17; left lateral leg. We changed to [facility] Blue last week after a prolonged course of Iodoflex which helps get the surface better. It appears that the wound with is improved. Continue with difficult areas on the left dorsal first second and plantar first second on the right 09/01/17; patient arrives  in clinic today having had a temperature of 103 yesterday. He was seen in the ER and [facility]. The patient was concerned he could have cellulitis again in the right leg however they diagnosed him with a UTI and he is now on Keflex. He has a history of cellulitis which is been recurrent and difficult but this is been in the left leg, in the past 5 use doxycycline. He does in and out catheterizations at home which are risk factors for UTI 09/08/17; patient will be completing his Keflex this weekend. The erythema on the left leg is considerably better. He has a new wound today on the medial part of the right leg small superficial almost looks like a skin tear. He has worsening of the area on the right dorsal first and second toe. His major area on the left lateral leg is better. Using Hydrofera Blue on all areas 09/15/17; gradual  reduction in width on the long wound in the left lateral leg. No debridement required. He also has wounds on the plantar aspect of his left first second toe web space and on the dorsal aspect of the right first second toe web space. 09/22/17; there continues to be very gradual improvements in the dimensions of the left lateral leg wound. He hasn't round erythematous spot with might be pressure on his wheelchair. There is no evidence obviously of infection no purulence no warmth He has a dry scaled area on the plantar aspect of the left first second toe Improved area on the dorsal right first second toe. 09/29/17; left lateral leg wound continues to improve in dimensions mostly with an is still a fairly long but increasingly narrow wound. He has a dry scaled area on the plantar aspect of his left first second toe web space Increasingly concerning area on the dorsal right first second toe. In fact I am concerned today about possible cellulitis around this wound. The areas extending up his second toe and although there is deformities here almost appears to abut on the nailbed. 10/06/17; left lateral leg wound continues to make very gradual progress. Tissue culture I did from the right first second toe dorsal foot last time grew MRSA and enterococcus which was vancomycin sensitive. This was not sensitive to clindamycin or doxycycline. He is allergic to Zyvox and sulfa we have therefore arrange for him to have dalvance infusion tomorrow. He is had this in the past and tolerated it well 10/20/17; left lateral leg wound continues to make decent progress. This is certainly reduced in terms of with there is advancing epithelialization.The cellulitis in the right foot looks better although he still has a deep wound in the dorsal aspect of the first second toe web space. Plantar left first toe web space on the left I think is making some progress 10/27/17; left lateral leg wound continues to make decent progress.  Advancing epithelialization.using Hydrofera Blue The right first second toe web space wound is better-looking using silver alginate Improvement in the left plantar first second toe web space. Again using silver alginate 11/03/17 left lateral leg wound continues to make decent progress albeit slowly. Using [facility] The right per second toe web space continues to be a very problematic looking punched out wound. I obtained a piece of tissue for deep culture I did extensively treated this for fungus. It is difficult to imagine that this is a pressure area as the patient states other than going outside he doesn't really wear shoes at home The left plantar first  second toe web space looked fairly senescent. Necrotic edges. This required debridement change to [facility] Blue to all wound areas 11/10/17; left lateral leg wound continues to contract. Using Hydrofera Blue On the right dorsal first second toe web space dorsally. Culture I did of this area last week grew MRSA there is not an easy oral option in this patient was multiple antibiotic allergies or intolerances. This was only a rare culture isolate I'm therefore going to use Bactroban under silver alginate On the left plantar first second toe web space. Debridement is required here. This is also unchanged 11/17/17; left lateral leg wound continues to contract using Hydrofera Blue this is no longer the major issue. The major concern here is the right first second toe web space. He now has an open area going from dorsally to the plantar aspect. There is now wound on the inner lateral part of the first toe. Not a very viable surface on this. There is erythema spreading medially into the forefoot. No major change in the left first second toe plantar wound 11/24/17; left lateral leg wound continues to contract using Hydrofera Blue. Nice improvement today The right first second toe web space all of this looks a lot less angry than last week. I have  given him clindamycin and topical Bactroban for MRSA and terbinafine for the possibility of underlining tinea pedis that I could not control with ketoconazole. Looks somewhat better The area on the plantar left first second toe web space is weeping with dried debris around the wound 12/01/17; left lateral leg wound continues to contract he Hydrofera Blue. It is becoming thinner in terms of with nevertheless it is making good improvement. The right first second toe web space looks less angry but still a large necrotic-looking wounds starting on the plantar aspect of the right foot extending between the toes and now extensively on the base of the right second toe. I gave him clindamycin and topical Bactroban for MRSA anterior benefiting for the possibility of underlying tinea pedis. Not looking better today The area on the left first/second toe looks better. Debrided of necrotic debris 12/05/17* the patient was worked in urgently today because over the weekend he found blood on his incontinence bad when he woke up. He was found to have an ulcer by his wife who does most of his wound care. He came in today for [place] to look at this. He has not had a history of wounds in his buttocks in spite of his paraplegia. 12/08/17; seen in follow-up today at his usual appointment. He was seen earlier this week and found to have a new wound on his buttock. We also follow him for wounds on the left lateral leg, left first second toe web space and right first second toe web space 12/15/17; we have been using Hydrofera Blue to the left lateral leg which has improved. The right first second toe web space has also improved. Left first second toe web space plantar aspect looks stable. The left buttock has worsened using Santyl. Apparently the buttock has drainage 12/22/17; we have been using Hydrofera Blue to the left lateral leg which continues to improve now 2 small wounds separated by normal skin. He tells [place] he had a fever up  to 100 yesterday he is prone to UTIs but has not noted anything different. He does in and out catheterizations. The area between [place], [place] ([id number]) [id number] Page 8 of 39 the first and second toes today does not look good  necrotic surface covered with what looks to be purulent drainage and erythema extending into the third toe. I had gotten this to something that I thought look better last time however it is not look good today. He also has a necrotic surface over the buttock wound which is expanded. I thought there might be infection under here so I removed a lot of the surface with a #5 curet though nothing look like it really needed culturing. He is been using Santyl to this area 12/27/17; his original wound on the left lateral leg continues to improve using Hydrofera Blue. I gave him samples of Baxdella although he was unable to take them out of fear for an allergic reaction ["lump in his throat"].the culture I did of the purulent drainage from his second toe last week showed both enterococcus and a set Enterobacter I was also concerned about the erythema on the bottom of his foot although paradoxically although this looks somewhat better today. 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 We've been using silver alginate here. They came up today that he sleeps in a recliner, I'm not sure why but I asked him to stop this 01/03/18; his original wound we've been using Hydrofera Blue is now separated into 2 areas. Ulcer on his left buttock is better he is off the recliner and sleeping in bed Finally both wound areas between his first and second toes also looks some better 01/10/18; his original wound on the left lateral leg is now separated into 2 wounds we've been using Hydrofera Blue Ulcer on his left buttock has some drainage. There is a small probing site going into muscle  layer superiorly.using silver alginate -He arrives today with a deep tissue injury on the left heel The wound on the dorsal aspect of his first second toe on the left looks a lot betterusing silver alginate ketoconazole The area on the first second toe web space on the right also looks a lot bette 01/17/18; his original wound on the left lateral leg continues to progress using Hydrofera Blue Ulcer on his left buttock also is smaller surface healthier except for a small probing site going into the muscle layer superiorly. 2.4 cm of tunneling in this area DTI on his left heel we have only been offloading. Looks better than last week no threatened open no evidence of infection the wound on the dorsal aspect of the first second toe on the left continues to look like it's regressing we have only been using silver alginate and terbinafine orally The area in the first second toe web space on the right also looks to be a lot better using silver alginate and terbinafine I think this was prompted by tinea pedis 01/31/18; the patient was hospitalized in [place] last week apparently for a complicated UTI. He was discharged on cefepime he does in and out catheterizations. In the hospital he was discovered M I don't mild elevation of AST and ALT and the terbinafine was stopped.predictably the pressure ulcer on s his buttock looks betterusing silver alginate. The area on the left lateral leg also is better using Hydrofera Blue. The area between the first and second toes on the left better. First and second toes on the right still substantial but better. Finally the DTI on the left heel has held together and looks like it's resolving 02/07/18-he is here in follow-up evaluation for multiple ulcerations. He has new injury  to the lateral aspect of the last issue a pressure ulcer, he states this is from adhesive removal trauma. He states he has tried multiple adhesive products with no success. All other ulcers appear  stable. The left heel DTI is resolving. We will continue with same treatment plan and follow-up next week. 02/14/18; follow-up for multiple areas. He has a new area last week on the lateral aspect of his pressure ulcer more over the posterior trochanter. The original pressure ulcer looks quite stable has healthy granulation. 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 Almost fully epithelialized on the original superior area using Hydrofera Blue DTI on the left heel has peeled off this week to reveal a small superficial wound under denuded skin and subcutaneous tissue Both areas between the first and second toes look better including nothing open on the left 02/21/18; The patient's wounds on his left ischial tuberosity and posterior left greater trochanter actually looked better. He has a large area of irritation around the area which I think is contact dermatitis. I am doubtful that this is fungal His original wound on the left lateral calf continues to improve we have been using Hydrofera Blue There is no open area in the left first second toe web space although there is a lot of thick callus The DTI on the left heel required debridement today of necrotic surface eschar and subcutaneous tissue using silver alginate Finally the area on the right first second toe webspace continues to contract using silver alginate and ketoconazole 02/28/18 Left ischial tuberosity wounds look better using silver alginate. Original wound on the left calf only has one small open area left using Hydrofera Blue DTI on the left heel required debridement mostly removing skin from around this wound surface. Using silver alginate The areas on the right first/second toe web space using silver alginate and ketoconazole 03/08/18 on evaluation today patient appears to be doing decently well as best I can tell in regard to his  wounds. This is the first time that I have seen him as he generally is followed by Dr. Dellia Nims. With that being said none of his wounds appear to be infected he does have an area where there is some skin covering what appears to be a new wound on the left dorsal surface of his great toe. This is right at the nail bed. With that being said I do believe that debrided away some of the excess skin can be of benefit in this regard. Otherwise he has been tolerating the dressing changes without complication. 03/14/18; patient arrives today with the multiplicity of wounds that we are following. He has not been systemically unwell Original wound on the left lateral calf now only has 2 small open areas we've been using Hydrofera Blue which should continue The deep tissue injury on the left heel requires debridement today. We've been using silver alginate The left first second toe and the right first second toe are both are reminiscence what I think was tinea pedis. Apparently some of the callus Surface between the toes was removed last week when it started draining. Purulent drainage coming from the wound on the ischial tuberosity on the left. 03/21/18-He is here in follow-up evaluation for multiple wounds. 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 He tolerated debridement. The only change to last week's recommendations is to discontinue antifungal cream between  toes. He will follow-up next week 03/28/18; following up for multiple wounds;Concern this week is streaking redness and swelling in the right foot. He is going to need antibiotics for this. 03/31/18; follow-up for right foot cellulitis. Streaking redness and swelling in the right foot on 03/28/18. 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 I put him on clindamycin 300 mg every 6 and brought him in for a quick check. He has an open wound between his first and second  toes on the right foot as a potential source. 04/04/18; Right foot cellulitis is resolving he is completing clindamycin. This is truly good news Left lateral calf wound which is initial wound only has one small open area inferiorly this is close to healing out. He has compression stockings. We will use Hydrofera Blue right down to the epithelialization of this Nonviable surface on the left heel which was initially pressure with a DTI. We've been using Hydrofera Blue. I'm going to switch this back to silver alginate Left first second toe/tinea pedis this looks better using silver alginate Right first second toe tinea pedis using silver alginate Large pressure ulcers on theLeft ischial tuberosity. Small wound here Looks better. I am uncertain about the surface over the large wound. Using silver alginate 04/11/18; Cellulitis in the right foot is resolved Left lateral calf wound which was his original wounds still has 2 tiny open areas remaining this is just about closed Nonviable surface on the left heel is better but still requires debridement Left first second toe/tinea pedis still open using silver alginate Right first second toe wound tinea pedis I asked him to go back to using ketoconazole and silver alginate Large pressure ulcers on the left ischial tuberosity this shear injury here is resolved. Wound is smaller. No evidence of infection using silver alginate 04/18/18; Patient arrives with an intense area of cellulitis in the right mid lower calf extending into the right heel area. Bright red and warm. Smaller area on the left Delduca, Jeno E ([id number]) [id number] Page 9 of 39 anterior leg. He has a significant history of MRSA. He will definitely need antibioticsdoxycycline He now has 2 open areas on the left ischial tuberosity the original large wound and now a satellite area which I think was above his initial satellite areas. Not a wonderful surface on this  satellite area surrounding erythema which looks like pressure related. His left lateral calf wound again his original wound is just about closed Left heel pressure injury still requiring debridement Left first second toe looks a lot better using silver alginate Right first second toe also using silver alginate and ketoconazole cream also looks better 04/20/18; the patient was worked in early today out of concerns with his cellulitis on the right leg. I had started him on doxycycline. This was 2 days ago. His wife was concerned about the swelling in the area. Also concerned about the left buttock. He has not been systemically unwell no fever chills. No nausea vomiting or diarrhea 04/25/18; the patient's left buttock wound is continued to deteriorate he is using Hydrofera Blue. He is still completing clindamycin for the cellulitis on the right leg although all of this looks better. 05/02/18 Left buttock wound still with a lot of drainage and a very tightly adherent fibrinous necrotic surface. He has a deeper area superiorly The left lateral calf wound is still closed DTI wound on the left heel necrotic surface especially the circumference using Iodoflex Areas between his left first second toe and  right first second toe both look better. Dorsally and the right first second toe he had a necrotic surface although at smaller. In using silver alginate and ketoconazole. I did a culture last week which was a deep tissue culture of the reminiscence of the open wound on the right first second toe dorsally. This grew a few Acinetobacter and a few methicillin-resistant staph aureus. Nevertheless the area actually this week looked better. I didn't feel the need to specifically address this at least in terms of systemic antibiotics. 05/09/18; wounds are measuring larger more drainage per our intake. We are using Santyl covered with alginate on the large superficial buttock wounds, Iodosorb on the left heel,  ketoconazole and silver alginate to the dorsal first and second toes bilaterally. 05/16/18; The area on his left buttock better in some aspects although the area superiorly over the ischial tuberosity required an extensive debridement.using Santyl Left heel appears stable. Using Iodoflex The areas between his first and second toes are not bad however there is spreading erythema up the dorsal aspect of his left foot this looks like cellulitis again. He is insensate the erythema is really very brilliant.o Erysipelas He went to see an allergist days ago because he was itching part of this he had lab work done. This showed a white count of 15.1 with 70% neutrophils. Hemoglobin of 11.4 and a platelet count of 659,000. Last white count we had in Epic was a 2-1/2 years ago which was 25.9 but he was ill at the time. He was able to show me some lab work that was done by his primary physician the pattern is about the same. 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 But prompted me to go ahead and do x-rays of both feet and the pelvis rule out osteomyelitis. He also had a comprehensive metabolic panel this was reasonably normal his albumin was 3.7 liver function tests BUN/creatinine all normal 05/23/18; x-rays of both his feet from last week were negative for underlying pulmonary abnormality. The x-ray of his pelvis however showed mild irregularity in the left ischial which may represent some early osteomyelitis. The wound in the left ischial continues to get deeper clearly now exposed muscle. Each week necrotic surface material over this area. Whereas the rest of the wounds do not look so bad. The left ischial wound we have been using Santyl and calcium alginate T the left heel surface necrotic debris using Iodoflex o The left lateral leg is still healed Areas on the left dorsal foot and the right dorsal foot are about the same. There is some inflammation on the left  which might represent contact dermatitis, fungal dermatitis I am doubtful cellulitis although this looks better than last week 05/30/18; CT scan done at Hospital did not show any osteomyelitis or abscess. Suggested the possibility of underlying cellulitis although I don't see a lot of evidence of this at the bedside The wound itself on the left buttock/upper thigh actually looks somewhat better. No debridement Left heel also looks better no debridement continue Iodoflex Both dorsal first second toe spaces appear better using Lotrisone. Left still required debridement 06/06/18; Intake reported some purulent looking drainage from the left gluteal wound. Using Santyl and calcium alginate Left heel looks better although still a nonviable surface requiring debridement The left dorsal foot first/second webspace actually expanding and somewhat deeper. I may consider doing a shave biopsy of this area Right dorsal foot first/second webspace appears stable to  improved. Using Lotrisone and silver alginate to both these areas 06/13/18 Left gluteal surface looks better. Now separated in the 2 wounds. No debridement required. Still drainage. We'll continue silver alginate Left heel continues to look better with Iodoflex continue this for at least another week Of his dorsal foot wounds the area on the left still has some depth although it looks better than last week. We've been using Lotrisone and silver alginate 06/20/18 Left gluteal continues to look better healthy tissue Left heel continues to look better healthy granulation wound is smaller. He is using Iodoflex and his long as this continues continue the Iodoflex Dorsal right foot looks better unfortunately dorsal left foot does not. There is swelling and erythema of his forefoot. He had minor trauma to this several days ago but doesn't think this was enough to have caused any tissue injury. Foot looks like cellulitis, we have had this problem before 06/27/18 on  evaluation today patient appears to be doing a little worse in regard to his foot ulcer. 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 Both of which would really be his only options for treating this infection. In the past he has been given and effusion of Orbactiv. This is done very well for him in the past again it's one time dosing IV antibiotic therapy. Subsequently I do believe this is something we're gonna need to see about doing at this point in time. Currently his other wounds seem to be doing somewhat better in my pinion I'm pretty happy in that regard. 07/03/18 on evaluation today patient's wounds actually appear to be doing fairly well. He has been tolerating the dressing changes without complication. All in all he seems to be showing signs of improvement. In regard to the antibiotics he has been dealing with infectious disease since I saw him last week as far as getting this scheduled. In the end [facility]'s [facility] this coming Friday. In the meantime he has been continuing to perform the dressing changes in such as previous. There does not appear to be any evidence of infection worsengin at this time. 07/10/18; Since I last saw this man 2 weeks ago things have actually improved. IV antibiotics of resulted in less forefoot erythema although there is still some present. He is not systemically unwell Left buttock wounds 2 now have no depth there is increased epithelialization Using silver alginate Left heel still requires debridement using Iodoflex Left dorsal foot still with a sizable wound about the size of a border but healthy granulation Right dorsal foot still with a slitlike area using silver alginate 07/18/18; the patient's cellulitis in the left foot is improved in fact I think it is on its way  to resolving. Left buttock wounds 2 both look better although the larger one has hypertension granulation we've been using silver alginate Left heel has some thick circumferential redundant skin over the wound edge which will need to be removed today we've been using Iodoflex Left dorsal foot is still a sizable wound required debridement using silver alginate The right dorsal foot is just about closed only a small open area remains here 07/25/18; left foot cellulitis is resolved Left buttock wounds 2 both look better. Hyper-granulation on the major area Left heel as some debris over the surface but otherwise looks a  healthier wound. Using silver collagen Right dorsal foot is just about closed 07/31/18; arrives with our intake nurse worried about purulent drainage from the buttock. We had hyper-granulation here last week His buttock wounds 2 continue to look better Left heel some debris over the surface but measuring smaller. Hobart, MAKOA SATZ ([id number]) [id number] Page 10 of 39 Right dorsal foot unfortunately has openings between the toes Left foot superficial wound looks less aggravated. 08/07/18 Buttock wounds continue to look better although some of her granulation and the larger medial wound. silver alginate Left heel continues to look a lot better.silver collagen Left foot superficial wound looks less stable. Requires debridement. He has a new wound superficial area on the foot on the lateral dorsal foot. Right foot looks better using silver alginate without Lotrisone 08/14/2018; patient was in the ER last week diagnosed with a UTI. He is now on Cefpodoxime and Macrodantin. Buttock wounds continued to be smaller. Using silver alginate Left heel continues to look better using silver collagen Left foot superficial wound looks as though it is improving Right dorsal foot area is just about healed. 08/21/2018; patient is completed his antibiotics for his UTI. He has 2  open areas on the buttocks. There is still not closed although the surface looks satisfactory. Using silver alginate Left heel continues to improve using silver collagen The bilateral dorsal foot areas which are at the base of his first and second toes/possible tinea pedis are actually stable on the left but worse on the right. The area on the left required debridement of necrotic surface. After debridement I obtained a specimen for PCR culture. The right dorsal foot which is been just about healed last week is now reopened 08/28/2018; culture done on the left dorsal foot showed coag negative staph both staph epidermidis and Lugdunensis. I think this is worthwhile initiating systemic treatment. I will use doxycycline given his long list of allergies. The area on the left heel slightly improved but still requiring debridement. The large wound on the buttock is just about closed whereas the smaller one is larger. Using silver alginate in this area 09/04/2018; patient is completing his doxycycline for the left foot although this continues to be a very difficult wound area with very adherent necrotic debris. We are using silver alginate to all his wounds right foot left foot and the small wounds on his buttock, silver collagen on the left heel. 09/11/2018; once again this patient has intense erythema and swelling of the left forefoot. Lesser degrees of erythema in the right foot. He has a long list of allergies and intolerances. I will reinstitute doxycycline. 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 Using silver alginate Left heel also looks better using silver collagen Unfortunately both the areas on his feet look worse. The area on the left first second webspace is now gone through to the plantar part of his foot. The area on the left foot anteriorly is irritated with erythema and swelling in the forefoot. 09/25/2018 His wound on the left plantar heel  looks better. Using silver collagen The area on the left buttock 2 small remnant areas. One is closed one is still open. Using silver alginate The areas between both his first and second toes look worse. This in spite of long-standing antifungal therapy with ketoconazole and silver alginate which should have antifungal activity He has small areas around his original wound on the left calf one is on the bottom of  the original scar tissue and one superiorly both of these are small and superficial but again given wound history in this site this is worrisome 10/02/2018 Left plantar heel continues to gradually contract using silver collagen Left buttock wound is unchanged using silver alginate The areas on his dorsal feet between his first and second toes bilaterally look about the same. I prescribed clindamycin ointment to see if we can address chronic staph colonization and also the underlying possibility of erythrasma The left lateral lower extremity wound is actually on the lateral part of his ankle. Small open area here. We have been using silver alginate 10/09/2018; Left plantar heel continues to look healthy and contract. No debridement is required Left buttock slightly smaller with a tape injury wound just below which was new this week Dorsal feet somewhat improved I have been using clindamycin Left lateral looks lower extremity the actual open area looks worse although a lot of this is epithelialized. I am going to change to silver collagen today He has a lot more swelling in the right leg although this is not pitting not red and not particularly warm there is a lot of spasm in the right leg usually indicative of people with paralysis of some underlying discomfort. We have reviewed his vascular status from 2017 he had a left greater saphenous vein ablation. I wonder about referring him back to vascular surgery if the area on the left leg continues to deteriorate. 10/16/2018 in today for  follow-up and management of multiple lower extremity ulcers. His left Buttock wound is much lower smaller and almost closed completely. The wound to the left ankle has began to reopen with Epithelialization and some adherent slough. He has multiple new areas to the left foot and leg. The left dorsal foot without much improvement. Wound present between left great webspace and 2nd toe. Erythema and edema present right leg. Right LE ultrasound obtained on 10/10/18 was negative for DVT . 10/23/2018; Left buttock is closed over. Still dry macerated skin but there is no open wound. I suspect this is chronic pressure/moisture Left lateral calf is quite a bit worse than when I saw this last. There is clearly drainage here he has macerated skin into the left plantar heel. We will change the primary dressing to alginate Left dorsal foot has some improvement in overall wound area. Still using clindamycin and silver alginate Right dorsal foot about the same as the left using clindamycin and silver alginate The erythema in the right leg has resolved. He is DVT rule out was negative Left heel pressure area required debridement although the wound is smaller and the surface is health 10/26/2018 The patient came back in for his nurse check today predominantly because of the drainage coming out of the left lateral leg with a recent reopening of his original wound on the left lateral calf. He comes in today with a large amount of surrounding erythema around the wound extending from the calf into the ankle and even in the area on the dorsal foot. He is not systemically unwell. He is not febrile. Nevertheless this looks like cellulitis. We have been using silver alginate to the area. I changed him to a regular visit and I am going to prescribe him doxycycline. The rationale here is a long list of medication intolerances and a history of MRSA. I did not see anything that I thought would provide a valuable culture  10/30/2018 Follow-up from his appointment 4 days ago with really an extensive area of cellulitis  in the left calf left lateral ankle and left dorsal foot. I put him on doxycycline. He has a long list of medication allergies which are true allergy reactions. 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 In any case he is a lot better today. The erythema especially in the anterior and lateral left calf is better. He still has left ankle erythema. He also is complaining about increasing edema in the right leg we have only been using Kerlix Coban and he has been doing the wraps at home. Finally he has a spotty rash on the medial part of his upper left calf which looks like folliculitis or perhaps wrap occlusion type injury. Small superficial macules not pustules 11/06/18 patient arrives today with again a considerable degree of erythema around the wound on the left lateral calf extending into the dorsal ankle and dorsal foot. This is a lot worse than when I saw this last week. He is on doxycycline really with not a lot of improvement. He has not been systemically unwell Wounds on the; left heel actually looks improved. Original area on the left foot and proximity to the first and second toes looks about the same. He has superficial areas on the dorsal foot, anterior calf and then the reopening of his original wound on the left lateral calf which looks about the same The only area he has on the right is the dorsal webspace first and second which is smaller. He has a large area of dry erythematous skin on the left buttock small open area here. 11/13/2018; the patient arrives in much better condition. The erythema around the wound on the left lateral calf is a lot better. Not sure whether this was the clindamycin or the TCA and ketoconazole or just in the improvement in edema control [stasis dermatitis]. In any case this is a lot better. The  area on the left heel is very small and just about resolved using silver collagen we have been using silver alginate to the areas on his dorsal feet Vanwey, Iaeger E ([id number]) [id number] Page 11 of 39 11/20/2018; his wounds include the left lateral calf, left heel, dorsal aspects of both feet just proximal to the first second webspace. He is stable to slightly improved. I did not think any changes to his dressings were going to be necessary 11/27/2018 he has a reopening on the left buttock which is surrounded by what looks like tinea or perhaps some other form of dermatitis. The area on the left dorsal foot has some erythema around it I have marked this area but I am not sure whether this is cellulitis or not. Left heel is not closed. Left calf the reopening is really slightly longer and probably worse 1/13; in general things look better and smaller except for the left dorsal foot. Area on the left heel is just about closed, left buttock looks better only a small wound remains in the skin looks better [using Lotrisone] 1/20; the area on the left heel only has a few remaining open areas here. Left lateral calf about the same in terms of size, left dorsal foot slightly larger right lateral foot still not closed. The area on the left buttock has no open wound and the surrounding skin looks a lot better 1/27; the area on the left heel is closed. Left lateral calf better but still requiring extensive debridements. The area on his left buttock is closed. He still has the  open areas on the left dorsal foot which is slightly smaller in the right foot which is slightly expanded. We have been using Iodoflex on these areas as well 2/3; left heel is closed. Left lateral calf still requiring debridement using Iodoflex there is no open area on his left buttock however he has dry scaly skin over a large area of this. Not really responding well to the Lotrisone. Finally the areas on his  dorsal feet at the level of the first second webspace are slightly smaller on the right and about the same on the left. Both of these vigorously debrided with Anasept and gauze 2/10; left heel remains closed he has dry erythematous skin over the left buttock but there is no open wound here. Left lateral leg has come in and with. Still requiring debridement we have been using Iodoflex here. Finally the area on the left dorsal foot and right dorsal foot are really about the same extremely dry callused fissured areas. He does not yet have a dermatology appointment 2/17; left heel remains closed. He has a new open area on the left buttock. The area on the left lateral calf is bigger longer and still covered in necrotic debris. No major change in his foot areas bilaterally. I am awaiting for a dermatologist to look on this. We have been using ketoconazole I do not know that this is been doing any good at all. 2/24; left heel remains closed. The left buttock wound that was new reopening last week looks better. The left lateral calf appears better also although still requires debridement. The major area on his foot is the left first second also requiring debridement. We have been putting Prisma on all wounds. I do not believe that the ketoconazole has done too much good for his feet. He will use Lotrisone I am going to give him a 2-week course of terbinafine. We still do not have a dermatology appointment 3/2 left heel remains closed however there is skin over bone in this area I pointed this out to him today. The left buttock wound is epithelialized but still does not look completely stable. The area on the left leg required debridement were using silver collagen here. With regards to his feet we changed to Lotrisone last week and silver alginate. 3/9; left heel remains closed. Left buttock remains closed. The area on the right foot is essentially closed. The left foot remains unchanged. Slightly smaller  on the left lateral calf. Using silver collagen to both of these areas 3/16-Left heel remains closed. Area on right foot is closed. Left lateral calf above the lateral malleolus open wound requiring debridement with easy bleeding. Left dorsal wound proximal to first toe also debrided. Left ischial area open new. Patient has been using Prisma with wrapping every 3 days. Dermatology appointment is apparently tomorrow.Patient has completed his terbinafine 2-week course with some apparent improvement according to him, there is still flaking and dry skin in his foot on the left 3/23; area on the right foot is reopened. The area on the left anterior foot is about the same still a very necrotic adherent surface. He still has the area on the left leg and reopening is on the left buttock. He apparently saw dermatology although I do not have a note. According to the patient who is usually fairly well informed they did not have any good ideas. Put him on oral terbinafine which she is been on before. 3/30; using silver collagen to all wounds. Apparently his dermatologist  put him on doxycycline and rifampin presumably some culture grew staph. I do not have this result. He remains on terbinafine although I have used terbinafine on him before 4/6; patient has had a fairly substantial reopening on the right foot between the first and second toes. He is finished his terbinafine and I believe is on doxycycline and rifampin still as prescribed by dermatology. We have been using silver collagen to all his wounds although the patient reports that he thinks silver alginate does better on the wounds on his buttock. 4/13; the area on his left lateral calf about the same size but it did not require debridement. Left dorsal foot just proximal to the webspace between the first and second toes is about the same. Still nonviable surface. I note some superficial bronze discoloration of the dorsal part of his foot Right dorsal  foot just proximal to the first and second toes also looks about the same. I still think there may be the same discoloration I noted above on the left Left buttock wound looks about the same 4/20; left lateral calf appears to be gradually contracting using silver collagen. 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 The left dorsal foot wound is debrided of tightly adherent necrotic debris and really cleans up quite nicely. The right area is worse with expansion. I did not debride this it is now over the base of the second toe The area on his left buttock is smaller no debridement is required using silver collagen 5/4; left calf continues to make good progress. He arrives with erythema around the wounds on his dorsal foot which even extends to the plantar aspect. Very concerning for coexistent infection. He is finished the erythromycin I gave him for possible erythrasma this does not seem to have helped. The area on the left foot is about the same base of the dorsal toes Is area on the buttock looks improved on the left 5/11; left calf and left buttock continued to make good progress. Left foot is about the same to slightly improved. Major problem is on the right foot. He has not had an x-ray. Deep tissue culture I did last week showed both Enterobacter and E. coli. I did not change the doxycycline I put him on empirically although neither 1 of these were plated to doxycycline. He arrives today with the erythema looking worse on both the dorsal and plantar foot. Macerated skin on the bottom of the foot. he has not been systemically unwell 5/18-Patient returns at 1 week, left calf wound appears to be making some progress, left buttock wound appears slightly worse than last time, left foot wound looks slightly better, right foot redness is marginally better. X-ray of both feet show no air or evidence of osteomyelitis. Patient is finished his  Omnicef and terbinafine. He continues to have macerated skin on the bottom of the left foot as well as right 5/26; left calf wound is better, left buttock wound appears to have multiple small superficial open areas with surrounding macerated skin. X-rays that I did last time showed no evidence of osteomyelitis in either foot. He is finished cefdinir and doxycycline. I do not think that he was on terbinafine. He continues to have a large superficial open area on the right foot anterior dorsal and slightly between the first and second toes. I did send him to dermatology 2 months ago or so wondering about whether they would do a fungal scraping. I do not believe  they did but did do a culture. We have been using silver alginate to the toe areas, he has been using antifungals at home topically either ketoconazole or Lotrisone. We are using silver collagen on the left foot, silver alginate on the right, silver collagen on the left lateral leg and silver alginate on the left buttock 6/1; left buttock area is healed. We have the left dorsal foot, left lateral leg and right dorsal foot. We are using silver alginate to the areas on both feet and silver collagen to the area on his left lateral calf 6/8; the left buttock apparently reopened late last week. He is not really sure how this happened. He is tolerating the terbinafine. Using silver alginate to all wounds 6/15; left buttock wound is larger than last week but still superficial. Came in the clinic today with a report of purulence from the left lateral leg I did not identify any infection Both areas on his dorsal feet appear to be better. He is tolerating the terbinafine. Using silver alginate to all wounds 6/22; left buttock is about the same this week, left calf quite a bit better. His left foot is about the same however he comes in with erythema and warmth in the right forefoot once again. Culture that I gave him in the beginning of May showed  Enterobacter and E. coli. I gave him doxycycline and things seem to improve although neither 1 of these organisms was specifically plated. 6/29; left buttock is larger and dry this week. Left lateral calf looks to me to be improved. Left dorsal foot also somewhat improved right foot completely unchanged. The erythema on the right foot is still present. He is completing the Ceftin dinner that I gave him empirically [see discussion above.) 7/6 - All wounds look to be stable and perhaps improved, the left buttock wound is slightly smaller, per patient bleeds easily, completed ceftin, the right foot redness is less, he is on terbinafine 7/13; left buttock wound about the same perhaps slightly narrower. Area on the left lateral leg continues to narrow. Left dorsal foot slightly smaller right foot about the same. We are using silver alginate on the right foot and Hydrofera Blue to the areas on the left. Unna boot on the left 2 layer compression on the right 7/20; left buttock wound absolutely the same. Area on lateral leg continues to get better. Left dorsal foot require debridement as did the right no major change Safley, Newt E ([id number]) [id number] Page 12 of 39 in the 7/27; left buttock wound the same size necrotic debris over the surface. The area on the lateral leg is closed once again. His left foot looks better right foot about the same although there is some involvement now of the posterior first second toe area. He is still on terbinafine which I have given him for a month, not certain a centimeter major change 06/25/19-All wounds appear to be slightly improved according to report, left buttock wound looks clean, both foot wounds have minimal to no debris the right dorsal foot has minimal slough. We are using Hydrofera Blue to the left and silver alginate to the right foot and ischial wound. 8/10-Wounds all appear to be around the same, the right forefoot distal part  has some redness which was not there before, however the wound looks clean and small. Ischial wound looks about the same with no changes 8/17; his wound on the left lateral calf which was his original chronic venous insufficiency wound remains closed. Since  I last saw him the areas on the left dorsal foot right dorsal foot generally appear better but require debridement. The area on his left initial tuberosity appears somewhat larger to me perhaps hyper granulated and bleeds very easily. We have been using Hydrofera Blue to the left dorsal foot and silver alginate to everything else 8/24; left lateral calf remains closed. The areas on his dorsal feet on the webspace of the first and second toes bilaterally both look better. The area on the left buttock which is the pressure ulcer stage II slightly smaller. I change the dressing to Hydrofera Blue to all areas 8/31; left lateral calf remains closed. The area on his dorsal feet bilaterally look better. Using Hydrofera Blue. Still requiring debridement on the left foot. No change in the left buttock pressure ulcers however 9/14; left lateral calf remains closed. Dorsal feet look quite a bit better than 2 weeks ago. Flaking dry skin also a lot better with the ammonium lactate I gave him 2 weeks ago. The area on the left buttock is improved. He states that his Roho cushion developed a leak and he is getting a new one, in the interim he is offloading this vigorously 9/21; left calf remains closed. Left heel which was a possible DTI looks better this week. He had macerated tissue around the left dorsal foot right foot looks satisfactory and improved left buttock wound. I changed his dressings to his feet to silver alginate bilaterally. Continuing Hydrofera Blue on the left buttock. 9/28 left calf remains closed. Left heel did not develop anything [possible DTI] dry flaking skin on the left dorsal foot. Right foot looks satisfactory. Improved left buttock  wound. We are using silver alginate on his feet Hydrofera Blue on the buttock. I have asked him to go back to the Lotrisone on his feet including the wounds and surrounding areas 10/5; left calf remains closed. The areas on the left and right feet about the same. A lot of this is epithelialized however debris over the remaining open areas. He is using Lotrisone and silver alginate. The area on the left buttock using Hydrofera Blue 10/26. Patient has been out for 3 weeks secondary to Covid concerns. He tested negative but I think his wife tested positive. He comes in today with the left foot substantially worse, right foot about the same. Even more concerning he states that the area on his left buttock closed over but then reopened and is considerably deeper in one aspect than it was before [stage III wound] 11/2; left foot really about the same as last week. Quarter sized wound on the dorsal foot just proximal to the first second toes. Surrounding erythema with areas of denuded epithelium. This is not really much different looking. Did not look like cellulitis this time however. Right foot area about the same.. We have been using silver alginate alginate on his toes Left buttock still substantial irritated skin around the wound which I think looks somewhat better. We have been using Hydrofera Blue here. 11/9; left foot larger than last week and a very necrotic surface. Right foot I think is about the same perhaps slightly smaller. Debris around the circumference also addressed. Unfortunately on the left buttock there is been a decline. Satellite lesions below the major wound distally and now a an additional one posteriorly we have been using Hydrofera Blue but I think this is a pressure issue 11/16; left foot ulcer dorsally again a very adherent necrotic surface. Right foot is about the  same. Not much change in the pressure ulcer on his left buttock. 11/30; left foot ulcer dorsally basically the same  as when I saw him 2 weeks ago. Very adherent fibrinous debris on the wound surface. Patient reports a lot of drainage as well. 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 We have been using Iodoflex, patient changed back to alginate because of the drainage. Area on his right dorsal foot really looks benign with a healthier surface certainly a lot better than on the left. Left buttock wounds all improved using Hydrofera Blue 12/7; left dorsal foot again no improvement. Tightly adherent debris. PCR culture I did last week only showed likely skin contaminant. I have gone ahead and done a punch biopsy of this which is about the last thing in terms of investigations I can think to do. He has known venous insufficiency and venous hypertension and this could be the issue here. The area on the right foot is about the same left buttock slightly worse according to our intake nurse secondary to [facility] Blue sticking to the wound 12/14; biopsy of the left foot that I did last time showed changes that could be related to wound healing/chronic stasis dermatitis phenomenon no neoplasm. We have been using silver alginate to both feet. I change the one on the left today to Sorbact and silver alginate to his other 2 wounds 12/28; the patient arrives with the following problems; Major issue is the dorsal left foot which continues to be a larger deeper wound area. Still with a completely nonviable surface Paradoxically the area mirror image on the right on the right dorsal foot appears to be getting better. He had some loss of dry denuded skin from the lower part of his original wound on the left lateral calf. Some of this area looked a little vulnerable and for this reason we put him in wrap that on this side this week The area on his left buttock is larger. He still has the erythematous circular area which I think is a combination of pressure, sweat. This does not look  like cellulitis or fungal dermatitis 11/26/2019; -Dorsal left foot large open wound with depth. Still debris over the surface. Using Sorbact The area on the dorsal right foot paradoxically has closed over He has a reopening on the left ankle laterally at the base of his original wound that extended up into the calf. This appears clean. The left buttock wound is smaller but with very adherent necrotic debris over the surface. We have been using silver alginate here as well The patient had arterial studies done in 2017. He had biphasic waveforms at the dorsalis pedis and posterior tibial bilaterally. ABI in the left was 1.17. Digit waveforms were dampened. He has slight spasticity in the great toes I do not think a TBI would be possible 1/11; the patient comes in today with a sizable reopening between the first and second toes on the right. This is not exactly in the same location where we have been treating wounds previously. According to our intake nurse this was actually fairly deep but 0.6 cm. The area on the left dorsal foot looks about the same the surface is somewhat cleaner using Sorbact, his MRI is in 2 days. We have not managed yet to get arterial studies. The new reopening on the left lateral calf looks somewhat better using alginate. The left buttock wound is about the same using alginate 1/18; the patient had his  ARTERIAL studies which were quite normal. ABI in the right at 1.13 with triphasic/biphasic waveforms on the left ABI 1.06 again with triphasic/biphasic waveforms. It would not have been possible to have done a toe brachial index because of spasticity. We have been using Sorbac to the left foot alginate to the rest of his wounds on the right foot left lateral calf and left buttock 1/25; arrives in clinic with erythema and swelling of the left forefoot worse over the first MTP area. This extends laterally dorsally and but also posteriorly. Still has an area on the left lateral part  of the lower part of his calf wound it is eschared and clearly not closed. Area on the left buttock still with surrounding irritation and erythema. Right foot surface wound dorsally. The area between the right and first and second toes appears better. 2/1; The left foot wound is about the same. Erythema slightly better I gave him a week of doxycycline empirically Right foot wound is more extensive extending between the toes to the plantar surface Left lateral calf really no open surface on the inferior part of his original wound however the entire area still looks vulnerable Absolutely no improvement in the left buttock wound required debridement. 2/8; the left foot is about the same. Erythema is slightly improved I gave him clindamycin last week. Right foot looks better he is using Lotrimin and silver alginate He has a breakdown in the left lateral calf. Denuded epithelium which I have removed Left buttock about the same were using Hydrofera Blue 2/15; left foot is about the same there is less surrounding erythema. Surface still has tightly adherent debris which I have debriding however not making any progress Right foot has a substantial wound on the medial right second toe between the first and second webspace. Still an open area on the left lateral calf distal area. Buttock wound is about the same Vanbuskirk, Antoney E ([id number]) [phone].pdf Page 13 of 39 2/22; left foot is about the same less surrounding erythema. Surface has adherent debris. Polymen Ag Right foot area significant wound between the first and second toes. We have been using silver alginate here Left lateral leg polymen Ag at the base of his original venous insufficiency wound Left buttock some improvement here 3/1; Right foot is deteriorating in the first second toe webspace. Larger and more substantial. We have been using silver alginate. Left dorsal foot about the same markedly adherent surface  debris using PolyMem Ag Left lateral calf surface debris using PolyMem AG Left buttock is improved again using PolyMem Ag. He is completing his terbinafine. The erythema in the foot seems better. He has been on this for 2 weeks 3/8; no improvement in any wound area in fact he has a small open area on the dorsal midfoot which is new this week. He has not gotten his foot x-rays yet 3/15; his x-rays were both negative for osteomyelitis of both feet. No major change in any of his wounds on the extremities however his buttock wounds are better. We have been using polymen on the buttocks, left lower leg. Iodoflex on the left foot and silver alginate on the right 3/22; arrives in clinic today with the 2 major issues are the improvement in the left dorsal foot wound which for once actually looks healthy with a nice healthy wound surface without debridement. Using Iodoflex here. Unfortunately on the left lateral calf which is in the distal part of his original wound he came to the clinic here for  there was purulent drainage noted some increased breakdown scattered around the original area and a small area proximally. We we are using polymen here will change to silver alginate today. His buttock wound on the left is better and I think the area on the right first second toe webspace is also improved 3/29; left dorsal foot looks better. Using Iodoflex. Left ankle culture from deterioration last time grew E. coli, Enterobacter and Enterococcus. I will give him a course of cefdinir although that will not cover Enterococcus. The area on the right foot in the webspace of the first and second toe lateral first toe looks better. The area on his buttock is about healed Vascular appointment is on April 21. This is to look at his venous system vis--vis continued breakdown of the wounds on the left including the left lateral leg and left dorsal foot he. He has had previous ablations on this side 4/5; the area between the  right first and second toes lateral aspect of the first toe looks better. Dorsal aspect of the left first toe on the left foot also improved. Unfortunately the left lateral lower leg is larger and there is a second satellite wound superiorly. The usual superficial abrasions on the left buttock overall better but certainly not closed 4/12; the area between the right first and second toes is improved. Dorsal aspect of the left foot also slightly smaller with a vibrant healthy looking surface. No real change in the left lateral leg and the left buttock wound is healed He has an unaffordable co-pay for Apligraf. Appointment with vein and vascular with regards to the left leg venous part of the circulation is on 4/21 4/19; we continue to see improvement in all wound areas. Although this is minor. He has his vascular appointment on 4/21. The area on the left buttock has not reopened although right in the center of this area the skin looks somewhat threatened 4/26; the left buttock is unfortunately reopened. In general his left dorsal foot has a healthy surface and looks somewhat smaller although it was not measured as such. The area between his first and second toe webspace on the right as a small wound against the first toe. The patient saw vascular surgery. The real question I was asking was about the small saphenous vein on the left. He has previously ablated left greater saphenous vein. Nothing further was commented on on the left. Right greater saphenous vein without reflux at the saphenofemoral junction or proximal thigh there was no indication for ablation of the right greater saphenous vein duplex was negative for DVT bilaterally. They did not think there was anything from a vascular surgery point of view that could be offered. They ABIs within normal limits 5/3; only small open area on the left buttock. The area on the left lateral leg which was his original venous reflux is now 2 wounds both which  look clean. We are using Iodoflex on the left dorsal foot which looks healthy and smaller. He is down to a very tiny area between the right first and second toes, using silver alginate 5/10; all of his wounds appear better. We have much better edema control in 4 layer compression on the left. This may be the factor that is allowing the left foot and left lateral calf to heal. He has external compression garments at home 04/14/20-All of his wounds are progressing well, the left forefoot is practically closed, left ischium appears to be about the same, right toe webspace is  also smaller. The left lateral leg is about the same, continue using Hydrofera Blue to this, silver alginate to the ischium, Iodoflex to the toe space on the right 6/7; most of his wounds outside of the left buttock are doing well. The area on the left lateral calf and left dorsal foot are smaller. The area on the right foot in between the first and second toe webspace is barely visible although he still says there is some drainage here is the only reason I did not heal this out. Unfortunately the area on the left buttock almost looks like he has a skin tear from tape. He has open wound and then a large flap of skin that we are trying to get adherence over an area just next to the remaining wound 6/21; 2 week follow-up. I believe is been here for nurse visits. Miraculously the area between his first and second toes on the left dorsal foot is closed over. Still open on the right first second web space. The left lateral calf has 2 open areas. Distally this is more superficial. The proximal area had a little more depth and required debridement of adherent necrotic material. His buttock wound is actually larger we have been using silver alginate here 6/28; the patient's area on the left foot remains closed. Still open wet area between the first and second toes on the right and also extending into the plantar aspect. We have been using  silver alginate in this location. He has 2 areas on the left lower leg part of his original long wounds which I think are better. We have been using Hydrofera Blue here. Hydrofera Blue to the left buttock which is stable 7/12; left foot remains closed. Left ankle is closed. May be a small area between his right first and second toes the only truly open area is on the left buttock. We have been using Hydrofera Blue here 7/19; patient arrives with marked deterioration especially in the left foot and ankle. We did not put him in a compression wrap on the left last week in fact he wore his juxta lite stockings on either side although he does not have an underlying stocking. He has a reopening on the left dorsal foot, left lateral ankle and a new area on the right dorsal ankle. More worrisome is the degree of erythema on the left foot extending on the lateral foot into the lateral lower leg on the left 7/26; the patient had erythema and drainage from the lateral left ankle last week. Culture of this grew MRSA resistant to doxycycline and clindamycin which are the 2 antibiotics we usually use with this patient who has multiple antibiotic allergies including linezolid, trimethoprim sulfamethoxazole. I had give him an empiric doxycycline and he comes in the area certainly looks somewhat better although it is blotchy in his lower leg. He has not been systemically unwell. He has had areas on the left dorsal foot which is a reopening, chronic wounds on the left lateral ankle. Both of these I think are secondary to chronic venous insufficiency. The area between his first and second toes is closed as far as I can tell. He had a new wrap injury on the right dorsal ankle last week. Finally he has an area on the left buttock. We have been using silver alginate to everything except the left buttock we are using Hydrofera Blue 06/30/20-Patient returns at 1 week, has been given a sample dose pack of NUZYRA which is a  tetracycline derivative [  omadacycline], patient has completed those, we have been using silver alginate to almost all the wounds except the left ischium where we are using Hydrofera Blue all of them look better 8/16; since I last saw the patient he has been doing well. The area on the left buttock, left lateral ankle and left foot are all closed today. He has completed the [place] I gave him last time and tolerated this well. He still has open areas on the right dorsal ankle and in the right first second toe area which we are using silver alginate. 8/23; we put him in his bilateral external compression stockings last week as he did not have anything open on either leg except for concerning area between the right first and second toe. He comes in today with an area on the left dorsal foot slightly more proximal than the original wound, the left lateral foot but this is actually a continuation of the area he had on the left lateral ankle from last time. As well he is opened up on the left buttock again. 8/30; comes in today with things looking a lot better. The area on the left lower ankle has closed down as has the left foot but with eschar in both areas. The area on the dorsal right ankle is also epithelialized. Very little remaining of the left buttock wound. We have been using silver alginate on all wound areas 9/13; the area in the first second toe webspace on the right has fully epithelialized. He still has some vulnerable epithelium on the right and the ankle and the dorsal foot. He notes weeping. He is using his juxta lite stocking. On the left again the left dorsal foot is closed left lateral ankle is closed. We went to the juxta lite stocking here as well. Still vulnerable in the left buttock although only 2 small open areas remain here 9/27; 2-week follow-up. We did not look at his left leg but the patient says everything is closed. He is a bit disturbed by the amount of edema in his left  foot he is using juxta lite stockings but asking about over the toes stockings which would be 30/40, will talk to him next time. According to him there is no open wound on either the left foot or the left ankle/calf He has an open area on the dorsal right calf which I initially point a wrap injury. He has superficial remaining wound on the left ischial tuberosity been using silver alginate although he says this sticks to the wound 10/5; we gave him 2-week follow-up but he called yesterday expressing some concerns about his right foot right ankle and the left buttock. He came in early. There is still no open areas on the left leg and that still in his juxta lite stocking Spurgeon, Faris E ([id number]) [id number] Page 14 of 39 10/11; he only has 1 small area on the left buttock that remains measuring millimeters 1 mm. Still has the same irritated skin in this area. We recommended zinc oxide when this eventually closes and pressure relief is meticulously is he can do this. He still has an area on the dorsal part of his right first through third toes which is a bit irritated and still open and on the dorsal ankle near the crease of the ankle. We have been using silver alginate and using his own stocking. He has nothing open on the left leg or foot 10/25; 2-week follow-up. Not nearly as good on the left buttock as  I was hoping. For open areas with 5 looking threatened small. He has the erythematous irritated chronic skin in this area. 1 area on the right dorsal ankle. He reports this area bleeds easily Right dorsal foot just proximal to the base of his toes We have been using silver alginate. 11/8; 2-week follow-up. Left buttock is about the same although I do not think the wounds are in the same location we have been using silver alginate. I have asked him to use zinc oxide on the skin around the wounds. He still has a small area on the right dorsal ankle he reports this bleeds  easily Right dorsal foot just proximal to the base of the toes does not have anything open although the skin is very dry and scaly He has a new opening on the nailbed of the left great toe. Nothing on the left ankle 11/29; 3-week follow-up. Left buttock has 2 open areas. And washing of these wounds today started bleeding easily. Suggesting very friable tissue. We have been using silver alginate. Right dorsal ankle which I thought was initially a wrap injury we have been using silver alginate. Nothing open between the toes that I can see. He states the area on the left dorsal toe nailbed healed after the last visit in 2 or 3 days 12/13; 3-week follow-up. His left buttock now has 3 open areas but the original 2 areas are smaller using polymen here. Surrounding skin looks better. The right dorsal ankle is closed. He has a small opening on the right dorsal foot at the level of the third toe. In general the skin looks better here. He is wearing his juxta lite stocking on the left leg says there is nothing open 11/24/2020; 3 weeks follow-up. His left buttock still has the 3 open areas. We have been using polymen but due to lack of response he changed to [facility] area. Surrounding skin is dry erythematous and irritated looking. There is no evidence of infection either bacterial or fungal however there is loss of surface epithelium He still has very dry skin in his foot causing irritation and erythema on the dorsal part of his toes. This is not responded to prolonged courses of antifungal simply looks dry and irritated 1/24; left buttock area still looks about the same he was unable to find the triad ointment that we had suggested. The area on the right lower leg just above the dorsal ankle has reopened and the areas on the right foot between the first second and second third toes and scaling on the bottom of the foot has been about the same for quite some time now. been using silver alginate to all  wound areas 2/7; left buttock wound looked quite good although not much smaller in terms of surface area surrounding skin looks better. Only a few dry flaking areas on the right foot in between the first and second toes the skin generally looks better here [ammonium lactate]. Finally the area on the right dorsal ankle is closed 2/21; There is no open area on the right foot even between the right first and second toe. Skin around this area dorsally and plantar aspects look better. He has a reopening of the area on the right ankle just above the crease of the ankle dorsally. I continue to think that this is probably friction from spasms may be even this time with his stocking under the compression stockings. Wounds on his left buttock look about the same there a couple of  areas that have reopened. He has a total square area of loss of epithelialization. This does not look like infection it looks like a contact dermatitis but I just cannot determine to what 3/14; there is nothing on the right foot between the first and second toes this was carefully inspected under illumination. Some chronic irritation on the dorsal part of his foot from toes 1-3 at the base. Nothing really open here substantially. Still has an area on the right foot/ankle that is actually larger and hyper granulated. His buttock area on the left is just about closed however he has chronic inflammation with loss of the surface epithelial layer 3/28; 2-week follow-up. In clinic today with a new wound on the left anterior mid tibia. Says this happened about 2 weeks ago. He is not really sure how wonders about the spasticity of his legs at night whether that could have caused this other than that he does not have a good idea. He has been using topical antibiotics and silver alginate. The area on his right dorsal ankle seems somewhat better. Finally everything on his left buttock is closed. 4/11; 2-week follow-up. All of his wounds are  better except for the area over the ischium and left buttock which have opened up widely again. At least part of this is covered in necrotic fibrinous material another part had rolled nonviable skin. The area on the right ankle, left anterior mid tibia are both a lot better. He had no open wounds on either foot including the areas between the first and second toes 4/25; patient presents for 2-week follow-up. He states that the wounds are overall stable. He has no complaints today and states he is using Hydrofera Blue to open wounds. 5/9; have not seen this man in over a month. For my memory he has open areas on the left mid tibia and right ankle. T oday he has new open area on the right dorsal foot which we have not had a problem with recently. He has the sustained area on the left buttock He is also changed his insurance at the beginning of the year [employer]. We will need prior authorizations for debridement 5/23; patient presents for 2-week follow-up. He has prior authorizations for debridement. He denies any issues in the past 2 weeks with his wound care. He has been using Hydrofera Blue to all the wounds. He does report a circular rash to the upper left leg that is new. He denies acute signs of infection. 6/6; 2-week follow-up. The patient has open wounds on the left buttock which are worse than the last time I saw this about a month ago. He also has a new area to me on the left anterior mid tibia with some surrounding erythema. The area on the dorsal ankle on the right is closed but I think this will be a friction injury every time this area is exposed to either our wraps or his compression stockings caused by unrelenting spasms in this leg. 6/20; 2-week follow-up. The patient has open wounds on the left buttock which is about the same. Using [facility] here. - The left mid tibia has a static amount of surrounding erythema. Also a raised area in the center. We have been using Hydrofera  Blue here. Finally he has broken down in his dorsal right foot extending between the first and second toes and going to the base of the first and second toe webspace. I have previously assumed that this was severe venous hypertension 7/5;  2-week follow-up The left buttock wound actually looks better. We are using Hydrofera Blue. He has extensive skin irritation around this area and I have not really been able to get that any better. I have tried Lotrisone i.e. antifungals and steroids. More most recently we have just been using Coloplast really looks about the same. The left mid tibia which was new last week culture to have very resistant staph aureus. Not only methicillin-resistant but doxycycline resistant. The patient has a plethora of antibiotic allergies including sulfa, linezolid. I used topical bacitracin on this but he has not started this yet. In addition he has an expanding area of erythema with a wound on the dorsal right foot. I did a deep tissue culture of this area today 7/12; Left buttock area actually looks better surrounding skin also looks less irritated. Left mid tibia looks about the same. He is using bacitracin this is not worse Right dorsal foot looks about the same as well. The left first toe also looks about the same 7/19; left buttock wound continues to improve in terms of open areas Left mid tibia is still concerning amount of swelling he is using bacitracin Dorsal left first toe somewhat smaller Right dorsal foot somewhat smaller 7/25; left buttock wound actually continues to improve Left mid tibia area has less swelling. I gave him all my samples of new Nuzyra. This seems to have helped although the wound is still open it. His abrasion closed by here Left dorsal great toe really no better. Still a very nonviable surface Right dorsal foot perhaps some better. We have been using bacitracin and silver nitrate to the areas on his lower legs and Hydrofera Blue to the  area on the buttock. 8/16 Disappointed that his left buttock wound is actually more substantial. Apparently during the last nurse visit these were both very small. He has continued irritation to a large area of skin on his buttock. I have never been able to totally explain this although I think it some combination of the way he sits, pressure, moisture. He is not incontinent enough to contribute to this. Left dorsal great toe still fibrinous debris on the surface that I have debrided today Boudoin, Mateus E ([id number]) [id number] Page 15 of 39 Large area across the dorsal right toes. The area on the left anterior mid tibia has less swelling. He completed the [place]. This does not look infected although the tissue is still fried 8/30; 2-week follow-up. Left buttock areas not improved. We used Hydrofera Blue on this. Weeping wet with the surrounding erythema that I have not been able to control even with Lotrisone and topical Coloplast Left dorsal great toe looks about the same More substantial area again at the base of his toes on the left which is new this week. Area across the dorsal right toes looks improved The left anterior mid tibia looks like it is trying to close 9/13; 2-week follow-up. Using silver alginate on all of his wounds. The left dorsal foot does not look any better. He has the area on the dorsal toe and also the areas at the base of all of his toes 1 through 3. On the right foot he has a similar pattern in a similar area. He has the area on his left mid tibia that looks fairly healthy. Finally the area on his left buttock looks somewhat bette 9/20; culture I did of the left foot which was a deep tissue culture last time showed E. coli he has erythema around  this wound. Still a completely necrotic surface. His right dorsal foot looks about the same. He has a very friable surface to the left anterior mid tibia. Both buttock wounds look better. We have  been using silver alginate to all wounds 10/4; he has completed the cephalexin that I gave him last time for the left foot. He is using topical gentamicin under silver alginate silver alginate being applied to all the wounds. Unfortunately all the wounds look irritated on his dorsal right foot dorsal left foot left mid tibia. I wonder if this could be a silver allergy. I am going to change him to [facility] on the lower extremity. The skin on the left buttock and left posterior thigh still flaking dry and irritated. This has continued no matter what I have applied topically to this. He has a solitary open wound which by itself does not look too bad however the entire area of surrounding skin does not change no matter what we have applied here 10/18; the area on the left dorsal foot and right dorsal foot both look better. The area on the right extends into the plantar but not between his toes. We have been using silver alginate. He still has a rectangular erythematous area around the area on the left tibia. The wound itself is very small. Finally everything on his left buttock looks a little larger the skin is erythematous 11/15; patient comes in with the left dorsal and right dorsal foot distally looking somewhat better. Still nonviable surface on the left foot which required debridement. He still has the area on the left anterior mid tibia although this looks somewhat better. He has a new area on the right lateral lower leg just above the ankle. Finally his left buttock looks terrible with multiple superficial open wounds geometric square shaped area of chronic erythema which I have not been able to sort through 11/29; right dorsal foot and left dorsal foot both look somewhat better. No debridement required. He has the fragile area on the left anterior mid tibia this looks and continues to look somewhat better. Right lateral lower leg just above the ankle we identified last time also looks  better. In general the area on his left buttock looks improved. We are using Hydrofera Blue to all wound areas 12/13; right dorsal foot looks better. The area on the right lateral leg is healed. Left dorsal foot has 2 open areas both of which require debridement. The fragile area on the left anterior mid tibial looks better. Smaller area on his buttocks. Were using Hydrofera Blue 1/10; patient comes in with everything looking slightly larger and/or worse. This includes his left buttock, reopening of the left mid tibia, larger areas on the left dorsal foot and what looks to be a cellulitis on the right dorsal and plantar foot. We have been using Hydrofera Blue on all wounds. 1/17; right dorsal foot distally looks better today. The left foot has 2 open wounds that are about the same surrounding erythema. 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 No malignancy they did not stain for fungus 1/24; his right distal foot is not closed dry and scaly but the wound looks like it is contracting. I did not debride anything here. Problem on his left dorsal foot with expanding erythema. Apparently there were problems last week getting the Elesa Hacker however it is now available at the [facility]  but a week later. He is using ketoconazole and Coloplast to the left buttock along with Hydrofera Blue this actually looks quite a bit better today. 1/31; right dorsal foot again is dry and scaly but looking to contract. He has been using a moisturizer on his feet at my request but he is not sure which 1. The left dorsal foot wounds look about the same there is erythema here that I marked last week however after course of Nuzyra it certainly is not any better but not any worse either. Finally on his left buttock the skin continues to look better he has the original wound but a new  substantial area towards the gluteal cleft. Almost like a skin tear. I used scissors to remove skin and subcutaneous tissue here silver nitrate and direct pressure 2/7; right dorsal foot. This does not look too much different from last week. Some erythema skin dry and scaly. No debridement. Left dorsal great toe again still not much improvement. I did remove flaking dry skin and callus from around the edge. Finally on his left buttock. The skin is somewhat better in the periwound. Surface wounds are superficial somewhat better than last week. 01/26/2022: Is a little bit of a mystery as to why his wounds fail to respond to treatments and actually seem to get worse. This is my first encounter with this patient. He was previously followed by Dr. Dellia Nims. Based upon my review of the chart, it seems that there is a little bit of a mystery as to why his wounds do not respond as anticipated to the interventions applied and sometimes even get worse. Biopsies have been performed and he was seen by dermatology in [place], but that did not shed any light on the matter. T oday, his gluteal wound is larger, with substantial drainage, rather malodorous. The food wounds are not terrible, but he has a lot of callus and scaly skin around these. He is currently getting silver alginate on the gluteal wound, with idodoflex to the feet. He is using lotrisone on his legs for the dry, scaly skin. 02/09/2022: There has really been no change to any of his wounds. The gluteal wound less drainage and odor, but remains about the same size, the periwound skin remains oddly scaly. His lower extremity wounds also appear roughly the same size. They continue to accumulate a small amount of slough. The periwound on his feet and ankle wounds has dry eschar and loose dead skin. We have been using silver alginate on the gluteal wound and Iodoflex on his feet and ankle wounds. T the periwound around his gluteal lesion and Lotrisone on his  feet and legs. o 02/23/2022: The right plantar foot wound is closed. The gluteal site looks small but has continued to produce hypertrophic granulation tissue. The foot wounds all look about the same on the dorsal surface of the right foot; on the left, there is only a small open area at the site of where his left great toenail would have been. 03/16/2022: The right ankle wound is healed. The right dorsal foot wound is about the same. The left dorsal foot wound is quite a bit smaller and the ischial wound is nearly closed. 03/30/2022: The right ankle wound reopened. Both dorsal foot wounds are quite a bit smaller. Unfortunately, he appears to have sheared part of his ischial wound open further, perhaps during a transfer. 04/13/2022: The right ankle wound has hypertrophic granulation tissue present. The dorsal foot wounds continue to decrease in size.  The ischial wound looks about the same today, no better, no worse. 04/27/2022: The right ankle wound has closed. Unfortunately, it looks like some moisture got underneath the dry skin on both of his dorsal feet and these wounds have expanded in size. The ischial wound remains the same with perhaps a little bit more slough accumulation than at our previous visit. 05/11/2022: The right ankle wound remains closed. There is a left anterior tibial wound that is small has patchy openings with accumulated slough. The dorsum of his right foot appears to be nearly healed with just a small punctate opening. The plantar surface of his right foot has a new opening that looks like he may have picked some skin there. His sacral ulcer has hypertrophic granulation tissue but has some slough accumulation. The dorsum of his left foot has multiple open areas in a fairly ragged distribution. All of these have slough accumulated within them. Sponsel, MATTHIAS BOGUS ([id number]) [id number] Page 16 of 39 06/01/2022: The right ankle and left anterior tibial wound are  both closed. Dorsum of his right foot and left foot both look substantially better with just tiny scattered openings The without any slough accumulation. He has sheared open new areas on his left gluteus and ischium. He says that his wheelchair cushion, which is air-filled, has a leak and so it keeps deflating. He is awaiting a new cushion. 06/15/2022: The right ankle wound has reopened and the fat layer is exposed. Both dorsal feet have just small openings with just a little bit of slough and eschar accumulation. The wound on his left gluteus and ischium is larger again today and has a foul odor. 06/29/2022: The right ankle wound has hypertrophic granulation tissue buildup. His dorsal foot wounds both look better with just some eschar on the surface. He has a new wound on his left lateral ankle. He is not sure how he acquired it but by appearance, it looks that he hit it on something, potentially his wheelchair or bed. The ischial wound is about the same but is cleaner without any significant purulent drainage or odor. He did not understand what the [facility] call was about and therefore he does not have the topical compounded antibiotic. 07/13/2022: The right ankle wound again has hypertrophic granulation tissue, but less so than at his previous visit. The ischial wound has improved tremendously the use of the [facility] topical antibiotic. No significant change to the left lateral ankle wound; it is fibrotic with slough present. The skin of both of his feet, especially on the right, has a yeasty appearance. 08/10/2022: There is again hypertrophic granulation tissue on the right anterior ankle wound. Both feet are about the same. The left lateral ankle wound is a little bit desiccated and has some slough buildup. He unfortunately suffered a new injury when he was removing his pants and they caught his bandage which caused a large skin tear on his left ischium, just distal to the existing wound. The existing  left ischial wound, however, is significantly better with just a little light slough on the surface. 08/31/2022: The right anterior ankle wound is a lot smaller today underneath some eschar. No accumulation of hypertrophic granulation tissue. The left lateral ankle wound has some slough on the surface but is better in terms of moisture balance this week. The left dorsal foot does not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 05/10/16; continue Apligraf to this. Her wound, Hydrafera to the lower wound. He has a new area on the right second toe. Left dorsal foot firstoosecond toe also looks improved 05/24/16; wound dimensions must be smaller I was able to use Apligraf to all 3 remaining wound areas. 06/07/16 patient's last Apligraf was 2 weeks ago. He arrives today with the 2 wounds on his lateral left leg joined together. This would have to be seen as a negative. He also has a small wound in his first and second toe on the left dorsally with quite a bit of surrounding erythema in the first  second and third toes. This looks to be infected or inflamed, very difficult clinical call. 06/21/16: lateral left leg combined wounds. Adherent surface slough area on the left dorsal foot at roughly the fourth toe looks improved 07/12/16; he now has a single linear wound on the lateral left leg. This does not look to be a lot changed from when I lost saw this. The area on his dorsal left foot looks considerably better however. 08/02/16; no major change in the substantial area on his left lateral leg since last time. We have been using Hydrofera Blue for a prolonged period of time now. The area on his left foot is also unchanged from last review 07/19/16; the area on his dorsal foot on the left looks considerably smaller. He is beginning to have significant rims of epithelialization on the lateral left leg wound. This also looks better. 08/05/16; the patient came in for a nurse visit today. Apparently the area on his left lateral leg looks better and it was wrapped. However in general discussion the patient noted a new area on the dorsal aspect of his right second toe. The exact etiology of this is unclear but likely relates to pressure. 08/09/16 really the area on the left lateral leg did not really look that healthy today perhaps slightly larger and measurements. The area on his dorsal right second toe is improved also the left foot wound looks stable to improved 08/16/16; the area on the last lateral leg did not change any of dimensions. Post debridement with a curet the area looked better. Left foot wound improved and the area on the dorsal right second toe is improved 08/23/16; the area on the left lateral leg may be slightly smaller both in terms of length and width. Aggressive debridement with a curette afterwards the tissue appears healthier. Left foot wound appears improved in the area on the dorsal right second toe is improved 08/30/16 patient developed a fever over the weekend and was seen in an  urgent care. Felt to have a UTI and put on doxycycline. He has been since changed over the phone to [facility]. After we took off the wrap on his right leg today the leg is swollen warm and erythematous, probably more likely the source of the fever 09/06/16; have been using collagen to the major  left leg wound, silver alginate to the area on his anterior foot/toes 09/13/16; the areas on his anterior foot/toes on both sides appear to be virtually closed. Extensive wound on the left lateral leg perhaps slightly narrower but each visit still covered an adherent surface slough 09/16/16 patient was in for his usual Thursday nurse visit however the intake nurse noted significant erythema of his dorsal right foot. He is also running a low- grade fever and having increasing spasms in the right leg 09/20/16 here for cellulitis involving his right great toes and forefoot. This is a lot better. Still requiring debridement on his left lateral leg. Santyl direct says he needs prior authorization. Therefore his wife cannot change this at home 09/30/16; the patient's extensive area on the left lateral calf and ankle perhaps somewhat better. Using Santyl. The area on the left toes is healed and I think the area on his right dorsal foot is healed as well. There is no cellulitis or venous inflammation involving the right leg. He is going to need compression stockings here. 10/07/16; the patient's extensive wound on the left lateral calf and ankle does not measure any differently however there appears to be less adherent surface slough using Santyl and aggressive weekly debridements 10/21/16; no major change in the area on the left lateral calf. Still the same measurement still very difficult to debridement adherent slough and nonviable subcutaneous tissue. This is not really been helped by several weeks of Santyl. Previously for 2 weeks I used Iodoflex for a short period. A prolonged course of Hydrofera Blue didn't  really help. I'm not sure why I only used 2 weeks of Iodoflex on this there is no evidence of surrounding infection. He has a small area on the right second toe which looks as though it's progressing towards closure 10/28/16; the wounds on his toes appear to be closed. No major change in the left lateral leg wound although the surface looks somewhat better using Iodoflex. He has had previous arterial studies that were normal. He has had reflux studies and is status post ablation although I don't have any exact notes on which vein was ablated. I'll need to check the surgical record 11/04/16; he's had a reopening between the first and second toe on the left and right. No major change in the left lateral leg wound. There is what appears to be cellulitis of the left dorsal foot 11/18/16 the patient was hospitalized initially in [place] and then subsequently transferred to [facility] long and was admitted there from 11/09/16 through 11/12/16. He had developed progressive cellulitis on the right leg in spite of the doxycycline I gave him. I'd spoken to the hospitalist in [place] who was concerned about continuing leukocytosis. CT scan is what I suggested this was done which showed soft tissue swelling without evidence of osteomyelitis or an underlying abscess blood cultures were negative. At [facility] he was treated with vancomycin and Primaxin and then add an infectious disease consult. He was transitioned to Ceftaroline. He has been making progressive improvement. Overall a severe cellulitis of the right leg. He is been using silver alginate to her original wound on the left leg. The wounds in his toes on the right are closed there is a small open area on the base of the left second toe 11/26/15; the patient's right leg is much better although there is still some edema here this could be reminiscent from his severe cellulitis likely on top of some degree of lymphedema. His left anterior leg  wound has less  surface slough as reported by her intake nurse. Small wound at the base of the left second toe Connery, Maveryk E ([id number]) [id number] Page 23 of 39 12/02/16; patient's right leg is better and there is no open wound here. His left anterior lateral leg wound continues to have a healthy-looking surface. Small wound at the base of the left second toe however there is erythema in the left forefoot which is worrisome 12/16/16; is no open wounds on his right leg. We took measurements for stockings. His left anterior lateral leg wound continues to have a healthy-looking surface. I'm not sure where we were with the Apligraf run through his insurance. We have been using Iodoflex. He has a thick eschar on the left first second toe interface, I suspect this may be fungal however there is no visible open 12/23/16; no open wound on his right leg. He has 2 small areas left of the linear wound that was remaining last week. We have been using Prisma, I thought I have disclosed this week, we can only look forward to next week 01/03/17; the patient had concerning areas of erythema last week, already on doxycycline for UTI through his primary doctor. The erythema is absolutely no better there is warmth and swelling both medially from the left lateral leg wound and also the dorsal left foot. 01/06/17- Patient is here for follow-up evaluation of his left lateral leg ulcer and bilateral feet ulcers. He is on oral antibiotic therapy, tolerating that. Nursing staff and the patient states that the erythema is improved from Monday. 01/13/17; the predominant left lateral leg wound continues to be problematic. I had put Apligraf on him earlier this month once. However he subsequently developed what appeared to be an intense cellulitis around the left lateral leg wound. I gave him Dalvance I think on 2/12 perhaps 2/13 he continues on cefdinir. The erythema is still present but the warmth and swelling is  improved. I am hopeful that the cellulitis part of this control. I wouldn't be surprised if there is an element of venous inflammation as well. 01/17/17. The erythema is present but better in the left leg. His left lateral leg wound still does not have a viable surface buttons certain parts of this long thin wound it appears like there has been improvement in dimensions. 01/20/17; the erythema still present but much better in the left leg. I'm thinking this is his usual degree of chronic venous inflammation. The wound on the left leg looks somewhat better. Is less surface slough 01/27/17; erythema is back to the chronic venous inflammation. The wound on the left leg is somewhat better. I am back to the point where I like to try an Apligraf once again 02/10/17; slight improvement in wound dimensions. Apligraf #2. He is completing his doxycycline 02/14/17; patient arrives today having completed doxycycline last Thursday. This was supposed to be a nurse visit however once again he hasn't tense erythema from the medial part of his wound extending over the lower leg. Also erythema in his foot this is roughly in the same distribution as last time. He has baseline chronic venous inflammation however this is a lot worse than the baseline I have learned to accept the on him is baseline inflammation 02/24/17- patient is here for follow-up evaluation. He is tolerating compression therapy. His voicing no complaints or concerns he is here anticipating an Apligraf 03/03/17; he arrives today with an adherent necrotic surface. I don't think this is surface is going to  be amenable for Apligraf's. The erythema around his wound and on the left dorsal foot has resolved he is off antibiotics 03/10/17; better-looking surface today. I don't think he can tolerate Apligraf's. He tells me he had a wound VAC after a skin graft years ago to this area and they had difficulty with a seal. The erythema continues to be stable around this some  degree of chronic venous inflammation but he also has recurrent cellulitis. We have been using Iodoflex 03/17/17; continued improvement in the surface and may be small changes in dimensions. Using Iodoflex which seems the only thing that will control his surface 03/24/17- He is here for follow up evaluation of his LLE lateral ulceration and ulcer to right dorsal foot/toe space. He is voicing no complaints or concerns, He is tolerating compression wrap. 03/31/17 arrives today with a much healthier looking wound on the left lower extremity. We have been using Iodoflex for a prolonged period of time which has for the first time prepared and adequate looking wound bed although we have not had much in the way of wound dimension improvement. He also has a small wound between the first and second toe on the right 04/07/17; arrives today with a healthy-looking wound bed and at least the top 50% of this wound appears to be now her. No debridement was required I have changed him to [facility] last week after prolonged Iodoflex. He did not do well with Apligraf's. We've had a re-opening between the first and second toe on the right 04/14/17; arrives today with a healthier looking wound bed contractions and the top 50% of this wound and some on the lesser 50%. Wound bed appears healthy. The area between the first and second toe on the right still remains problematic 04/21/17; continued very gradual improvement. Using [facility] 04/28/17; continued very gradual improvement in the left lateral leg venous insufficiency wound. His periwound erythema is very mild. We have been using Hydrofera Blue. Wound is making progress especially in the superior 50% 05/05/17; he continues to have very gradual improvement in the left lateral venous insufficiency wound. Both in terms with an length rings are improving. I debrided this every 2 weeks with #5 curet and we have been using Hydrofera Blue and again making good  progress With regards to the wounds between his right first and second toe which I thought might of been tinea pedis he is not making as much progress very dry scaly skin over the area. Also the area at the base of the left first and second toe in a similar condition 05/12/17; continued gradual improvement in the refractory left lateral venous insufficiency wound on the left. Dimension smaller. Surface still requiring debridement using Hydrofera Blue 05/19/17; continued gradual improvement in the refractory left lateral venous ulceration. Careful inspection of the wound bed underlying rumination suggested some degree of epithelialization over the surface no debridement indicated. Continue Hydrofera Blue difficult areas between his toes first and third on the left than first and second on the right. I'm going to change to silver alginate from silver collagen. Continue ketoconazole as I suspect underlying tinea pedis 05/26/17; left lateral leg venous insufficiency wound. We've been using Hydrofera Blue. I believe that there is expanding epithelialization over the surface of the wound albeit not coming from the wound circumference. This is a bit of an odd situation in which the epithelialization seems to be coming from the surface of the wound rather than in the exact circumference. There is still small open  areas mostly along the lateral margin of the wound. ooHe has unchanged areas between the left first and second and the right first second toes which I been treating for tenia pedis 06/02/17; left lateral leg venous insufficiency wound. We have been using Hydrofera Blue. Somewhat smaller from the wound circumference. The surface of the wound remains a bit on it almost epithelialized sedation in appearance. I use an open curette today debridement in the surface of all of this especially the edges ooSmall open wounds remaining on the dorsal right first and second toe interspace and the plantar left first  second toe and her face on the left 06/09/17; wound on the left lateral leg continues to be smaller but very gradual and very dry surface using Hydrofera Blue 06/16/17 requires weekly debridements now on the left lateral leg although this continues to contract. I changed to silver collagen last week because of dryness of the wound bed. Using Iodoflex to the areas on his first and second toes/web space bilaterally 06/24/17; patient with history of paraplegia also chronic venous insufficiency with lymphedema. Has a very difficult wound on the left lateral leg. This has been gradually reducing in terms of with but comes in with a very dry adherent surface. High switch to silver collagen a week or so ago with hydrogel to keep the area moist. This is been refractory to multiple dressing attempts. He also has areas in his first and second toes bilaterally in the anterior and posterior web space. I had been using Iodoflex here after a prolonged course of silver alginate with ketoconazole was ineffective [question tinea pedis] 07/14/17; patient arrives today with a very difficult adherent material over his left lateral lower leg wound. He also has surrounding erythema and poorly controlled edema. He was switched his Santyl last visit which the nurses are applying once during his doctor visit and once on a nurse visit. He was also reduced to 2 layer compression I'm not exactly sure of the issue here. 07/21/17; better surface today after 1 week of Iodoflex. Significant cellulitis that we treated last week also better. [Doxycycline] 07/28/17 better surface today with now 2 weeks of Iodoflex. Significant cellulitis treated with doxycycline. He has now completed the doxycycline and he is back to his usual degree of chronic venous inflammation/stasis dermatitis. He reminds me he has had ablations surgery here 08/04/17; continued improvement with Iodoflex to the left lateral leg wound in terms of the surface of the wound  although the dimensions are better. 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 Problematic areas on the plantar aspect of the first second toe web space on the left and the dorsal aspect of the first second toe web space on the right. At one point I felt these were probably related to chronic fungal infections in treated him aggressively for this although we have not made any improvement here. 08/11/17; left lateral leg. Surface continues to improve with the Iodoflex although we are not seeing much improvement in overall wound dimensions. Areas on his plantar left foot and right foot show no improvement. In fact the right foot looks somewhat worse 08/18/17; left lateral leg. We changed to [facility] Blue last week after a prolonged course of Iodoflex which helps get the surface better. It appears that the wound with is improved. Continue with difficult areas on the left dorsal first second and plantar first second on the right 09/01/17; patient arrives in clinic today having had  a temperature of 103 yesterday. He was seen in the ER and [facility]. The patient was concerned he could have cellulitis again in the right leg however they diagnosed him with a UTI and he is now on Keflex. He has a history of cellulitis which is been recurrent and difficult but this is been in the left leg, in the past 5 use doxycycline. He does in and out catheterizations at home which are risk factors for UTI 09/08/17; patient will be completing his Keflex this weekend. The erythema on the left leg is considerably better. He has a new wound today on the medial part of the right leg small superficial almost looks like a skin tear. He has worsening of the area on the right dorsal first and second toe. His major area on the left Dishner, Simone E ([id number]) [id number] Page 24 of 39 lateral leg is better. Using Hydrofera Blue on all  areas 09/15/17; gradual reduction in width on the long wound in the left lateral leg. No debridement required. He also has wounds on the plantar aspect of his left first second toe web space and on the dorsal aspect of the right first second toe web space. 09/22/17; there continues to be very gradual improvements in the dimensions of the left lateral leg wound. He hasn't round erythematous spot with might be pressure on his wheelchair. There is no evidence obviously of infection no purulence no warmth ooHe has a dry scaled area on the plantar aspect of the left first second toe ooImproved area on the dorsal right first second toe. 09/29/17; left lateral leg wound continues to improve in dimensions mostly with an is still a fairly long but increasingly narrow wound. ooHe has a dry scaled area on the plantar aspect of his left first second toe web space ooIncreasingly concerning area on the dorsal right first second toe. In fact I am concerned today about possible cellulitis around this wound. The areas extending up his second toe and although there is deformities here almost appears to abut on the nailbed. 10/06/17; left lateral leg wound continues to make very gradual progress. Tissue culture I did from the right first second toe dorsal foot last time grew MRSA and enterococcus which was vancomycin sensitive. This was not sensitive to clindamycin or doxycycline. He is allergic to Zyvox and sulfa we have therefore arrange for him to have dalvance infusion tomorrow. He is had this in the past and tolerated it well 10/20/17; left lateral leg wound continues to make decent progress. This is certainly reduced in terms of with there is advancing epithelialization.ooThe cellulitis in the right foot looks better although he still has a deep wound in the dorsal aspect of the first second toe web space. Plantar left first toe web space on the left I think is making some progress 10/27/17; left lateral leg  wound continues to make decent progress. Advancing epithelialization.using Hydrofera Blue ooThe right first second toe web space wound is better-looking using silver alginate ooImprovement in the left plantar first second toe web space. Again using silver alginate 11/03/17 left lateral leg wound continues to make decent progress albeit slowly. Using Hydrofera Blue ooThe right per second toe web space continues to be a very problematic looking punched out wound. I obtained a piece of tissue for deep culture I did extensively treated this for fungus. It is difficult to imagine that this is a pressure area as the patient states other than going outside he doesn't really wear shoes at home [facility]  ooThe left plantar first second toe web space looked fairly senescent. Necrotic edges. This required debridement oochange to Hydrofera Blue to all wound areas 11/10/17; left lateral leg wound continues to contract. Using Hydrofera Blue ooOn the right dorsal first second toe web space dorsally. Culture I did of this area last week grew MRSA there is not an easy oral option in this patient was multiple antibiotic allergies or intolerances. This was only a rare culture isolate I'm therefore going to use Bactroban under silver alginate ooOn the left plantar first second toe web space. Debridement is required here. This is also unchanged 11/17/17; left lateral leg wound continues to contract using Hydrofera Blue this is no longer the major issue. ooThe major concern here is the right first second toe web space. He now has an open area going from dorsally to the plantar aspect. There is now wound on the inner lateral part of the first toe. Not a very viable surface on this. There is erythema spreading medially into the forefoot. ooNo major change in the left first second toe plantar wound 11/24/17; left lateral leg wound continues to contract using Hydrofera Blue. Nice improvement today ooThe right first second toe  web space all of this looks a lot less angry than last week. I have given him clindamycin and topical Bactroban for MRSA and terbinafine for the possibility of underlining tinea pedis that I could not control with ketoconazole. Looks somewhat better ooThe area on the plantar left first second toe web space is weeping with dried debris around the wound 12/01/17; left lateral leg wound continues to contract he Hydrofera Blue. It is becoming thinner in terms of with nevertheless it is making good improvement. ooThe right first second toe web space looks less angry but still a large necrotic-looking wounds starting on the plantar aspect of the right foot extending between the toes and now extensively on the base of the right second toe. I gave him clindamycin and topical Bactroban for MRSA anterior benefiting for the possibility of underlying tinea pedis. Not looking better today ooThe area on the left first/second toe looks better. Debrided of necrotic debris 12/05/17* the patient was worked in urgently today because over the weekend he found blood on his incontinence bad when he woke up. He was found to have an ulcer by his wife who does most of his wound care. He came in today for [place] to look at this. He has not had a history of wounds in his buttocks in spite of his paraplegia. 12/08/17; seen in follow-up today at his usual appointment. He was seen earlier this week and found to have a new wound on his buttock. We also follow him for wounds on the left lateral leg, left first second toe web space and right first second toe web space 12/15/17; we have been using Hydrofera Blue to the left lateral leg which has improved. The right first second toe web space has also improved. Left first second toe web space plantar aspect looks stable. The left buttock has worsened using Santyl. Apparently the buttock has drainage 12/22/17; we have been using Hydrofera Blue to the left lateral leg which continues to  improve now 2 small wounds separated by normal skin. He tells [place] he had a fever up to 100 yesterday he is prone to UTIs but has not noted anything different. He does in and out catheterizations. The area between the first and second toes today does not look good necrotic surface covered with what  looks to be purulent drainage and erythema extending into the third toe. I had gotten this to something that I thought look better last time however it is not look good today. He also has a necrotic surface over the buttock wound which is expanded. I thought there might be infection under here so I removed a lot of the surface with a #5 curet though nothing look like it really needed culturing. He is been using Santyl to this area 12/27/17; his original wound on the left lateral leg continues to improve using Hydrofera Blue. I gave him samples of Baxdella although he was unable to take them out of fear for an allergic reaction ["lump in his throat"].the culture I did of the purulent drainage from his second toe last week showed both enterococcus and a set Enterobacter I was also concerned about the erythema on the bottom of his foot although paradoxically although this looks somewhat better today. 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 We've been using silver alginate here. They came up today that he sleeps in a recliner, I'm not sure why but I asked him to stop this 01/03/18; his original wound we've been using Hydrofera Blue is now separated into 2 areas. ooUlcer on his left buttock is better he is off the recliner and sleeping in bed ooFinally both wound areas between his first and second toes also looks some better 01/10/18; his original wound on the left lateral leg is now separated into 2 wounds we've been using Hydrofera Blue ooUlcer on his left buttock has some drainage. There is a small probing site going  into muscle layer superiorly.using silver alginate -He arrives today with a deep tissue injury on the left heel ooThe wound on the dorsal aspect of his first second toe on the left looks a lot betterusing silver alginate ketoconazole ooThe area on the first second toe web space on the right also looks a lot bette 01/17/18; his original wound on the left lateral leg continues to progress using Hydrofera Blue ooUlcer on his left buttock also is smaller surface healthier except for a small probing site going into the muscle layer superiorly. 2.4 cm of tunneling in this area ooDTI on his left heel we have only been offloading. Looks better than last week no threatened open no evidence of infection oothe wound on the dorsal aspect of the first second toe on the left continues to look like it's regressing we have only been using silver alginate and terbinafine orally ooThe area in the first second toe web space on the right also looks to be a lot better using silver alginate and terbinafine I think this was prompted by tinea pedis 01/31/18; the patient was hospitalized in [place] last week apparently for a complicated UTI. He was discharged on cefepime he does in and out catheterizations. In the hospital he was discovered M I don't mild elevation of AST and ALT and the terbinafine was stopped.predictably the pressure ulcer on s his buttock looks betterusing silver alginate. The area on the left lateral leg also is better using Hydrofera Blue. The area between the first and second toes on the left better. First and second toes on the right still substantial but better. Finally the DTI on the left heel has held together and looks like it's resolving 02/07/18-he is here in follow-up evaluation for multiple ulcerations. He has new injury to the lateral aspect of  the last issue a pressure ulcer, he states this is from adhesive removal trauma. He states he has tried multiple adhesive products with no  success. All other ulcers appear stable. The left heel DTI is resolving. We will continue with same treatment plan and follow-up next week. 02/14/18; follow-up for multiple areas. ooHe has a new area last week on the lateral aspect of his pressure ulcer more over the posterior trochanter. The original pressure ulcer looks quite stable has Holycross, Cesareo E ([id number]) [id number] Page 25 of 39 healthy granulation. 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 Almost fully epithelialized on the original superior area using Hydrofera Blue ooDTI on the left heel has peeled off this week to reveal a small superficial wound under denuded skin and subcutaneous tissue ooBoth areas between the first and second toes look better including nothing open on the left 02/21/18; ooThe patient's wounds on his left ischial tuberosity and posterior left greater trochanter actually looked better. He has a large area of irritation around the area which I think is contact dermatitis. I am doubtful that this is fungal ooHis original wound on the left lateral calf continues to improve we have been using Hydrofera Blue ooThere is no open area in the left first second toe web space although there is a lot of thick callus ooThe DTI on the left heel required debridement today of necrotic surface eschar and subcutaneous tissue using silver alginate ooFinally the area on the right first second toe webspace continues to contract using silver alginate and ketoconazole 02/28/18 ooLeft ischial tuberosity wounds look better using silver alginate. ooOriginal wound on the left calf only has one small open area left using Hydrofera Blue ooDTI on the left heel required debridement mostly removing skin from around this wound surface. Using silver alginate ooThe areas on the right first/second toe web  space using silver alginate and ketoconazole 03/08/18 on evaluation today patient appears to be doing decently well as best I can tell in regard to his wounds. This is the first time that I have seen him as he generally is followed by Dr. Dellia Nims. With that being said none of his wounds appear to be infected he does have an area where there is some skin covering what appears to be a new wound on the left dorsal surface of his great toe. This is right at the nail bed. With that being said I do believe that debrided away some of the excess skin can be of benefit in this regard. Otherwise he has been tolerating the dressing changes without complication. 03/14/18; patient arrives today with the multiplicity of wounds that we are following. He has not been systemically unwell ooOriginal wound on the left lateral calf now only has 2 small open areas we've been using Hydrofera Blue which should continue ooThe deep tissue injury on the left heel requires debridement today. We've been using silver alginate ooThe left first second toe and the right first second toe are both are reminiscence what I think was tinea pedis. Apparently some of the callus Surface between the toes was removed last week when it started draining. ooPurulent drainage coming from the wound on the ischial tuberosity on the left. 03/21/18-He is here in follow-up evaluation for multiple wounds. 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 He tolerated debridement. The only change to last week's recommendations is to  discontinue antifungal cream between toes. He will follow-up next week 03/28/18; following up for multiple wounds;Concern this week is streaking redness and swelling in the right foot. He is going to need antibiotics for this. 03/31/18; follow-up for right foot cellulitis. Streaking redness and swelling in the right foot on 03/28/18. He has multiple antibiotic  intolerances and a history of MRSA. I put him on clindamycin 300 mg every 6 and brought him in for a quick check. He has an open wound between his first and second toes on the right foot as a potential source. 04/04/18; ooRight foot cellulitis is resolving he is completing clindamycin. This is truly good news ooLeft lateral calf wound which is initial wound only has one small open area inferiorly this is close to healing out. He has compression stockings. We will use Hydrofera Blue right down to the epithelialization of this ooNonviable surface on the left heel which was initially pressure with a DTI. We've been using Hydrofera Blue. I'm going to switch this back to silver alginate ooLeft first second toe/tinea pedis this looks better using silver alginate ooRight first second toe tinea pedis using silver alginate ooLarge pressure ulcers on theLeft ischial tuberosity. Small wound here Looks better. I am uncertain about the surface over the large wound. Using silver alginate 04/11/18; ooCellulitis in the right foot is resolved ooLeft lateral calf wound which was his original wounds still has 2 tiny open areas remaining this is just about closed ooNonviable surface on the left heel is better but still requires debridement ooLeft first second toe/tinea pedis still open using silver alginate ooRight first second toe wound tinea pedis I asked him to go back to using ketoconazole and silver alginate ooLarge pressure ulcers on the left ischial tuberosity this shear injury here is resolved. Wound is smaller. No evidence of infection using silver alginate 04/18/18; ooPatient arrives with an intense area of cellulitis in the right mid lower calf extending into the right heel area. Bright red and warm. Smaller area on the left anterior leg. He has a significant history of MRSA. He will definitely need antibioticsoodoxycycline ooHe now has 2 open areas on the left ischial tuberosity the  original large wound and now a satellite area which I think was above his initial satellite areas. Not a wonderful surface on this satellite area surrounding erythema which looks like pressure related. ooHis left lateral calf wound again his original wound is just about closed ooLeft heel pressure injury still requiring debridement ooLeft first second toe looks a lot better using silver alginate ooRight first second toe also using silver alginate and ketoconazole cream also looks better 04/20/18; the patient was worked in early today out of concerns with his cellulitis on the right leg. I had started him on doxycycline. This was 2 days ago. His wife was concerned about the swelling in the area. Also concerned about the left buttock. He has not been systemically unwell no fever chills. No nausea vomiting or diarrhea 04/25/18; the patient's left buttock wound is continued to deteriorate he is using Hydrofera Blue. He is still completing clindamycin for the cellulitis on the right leg although all of this looks better. 05/02/18 ooLeft buttock wound still with a lot of drainage and a very tightly adherent fibrinous necrotic surface. He has a deeper area superiorly ooThe left lateral calf wound is still closed ooDTI wound on the left heel necrotic surface especially the circumference using Iodoflex ooAreas between his left first second toe and right first second toe both  look better. Dorsally and the right first second toe he had a necrotic surface although at smaller. In using silver alginate and ketoconazole. I did a culture last week which was a deep tissue culture of the reminiscence of the open wound on the right first second toe dorsally. This grew a few Acinetobacter and a few methicillin-resistant staph aureus. Nevertheless the area actually this week looked better. I didn't feel the need to specifically address this at least in terms of systemic antibiotics. 05/09/18; wounds are measuring  larger more drainage per our intake. We are using Santyl covered with alginate on the large superficial buttock wounds, Iodosorb on the left heel, ketoconazole and silver alginate to the dorsal first and second toes bilaterally. 05/16/18; ooThe area on his left buttock better in some aspects although the area superiorly over the ischial tuberosity required an extensive debridement.using Santyl ooLeft heel appears stable. Using Iodoflex ooThe areas between his first and second toes are not bad however there is spreading erythema up the dorsal aspect of his left foot this looks like cellulitis again. He is insensate the erythema is really very brilliant.o Erysipelas He went to see an allergist days ago because he was itching part of this he had lab work done. This showed a white count of 15.1 with 70% neutrophils. Hemoglobin of 11.4 and a platelet count of 659,000. Last white count we had in Epic was a 2-1/2 years ago which was 25.9 but he was ill at the time. He was able to show me some lab work that was done by his primary physician the pattern is about the same. 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 But prompted me to go ahead and do x-rays of both feet and the pelvis rule out osteomyelitis. He also had a comprehensive metabolic panel this was reasonably normal his albumin was 3.7 liver function tests BUN/creatinine all normal 05/23/18; x-rays of both his feet from last week were negative for underlying pulmonary abnormality. The x-ray of his pelvis however showed mild irregularity in the left ischial which may represent some early osteomyelitis. The wound in the left ischial continues to get deeper clearly now exposed muscle. Each week necrotic surface material over this area. Whereas the rest of the wounds do not look so bad. ooThe left ischial wound we have been using Santyl and calcium alginate Roehrs, Kutler E ([id number])  [id number] Page 26 of 39 ooT the left heel surface necrotic debris using Iodoflex o ooThe left lateral leg is still healed ooAreas on the left dorsal foot and the right dorsal foot are about the same. There is some inflammation on the left which might represent contact dermatitis, fungal dermatitis I am doubtful cellulitis although this looks better than last week 05/30/18; CT scan done at Hospital did not show any osteomyelitis or abscess. Suggested the possibility of underlying cellulitis although I don't see a lot of evidence of this at the bedside ooThe wound itself on the left buttock/upper thigh actually looks somewhat better. No debridement ooLeft heel also looks better no debridement continue Iodoflex ooBoth dorsal first second toe spaces appear better using Lotrisone. Left still required debridement 06/06/18; ooIntake reported some purulent looking drainage from the left gluteal wound. Using Santyl and calcium alginate ooLeft heel looks better although still a nonviable surface requiring debridement ooThe left dorsal foot first/second webspace actually expanding and somewhat deeper. I may consider doing a shave biopsy of this area ooRight dorsal foot first/second  webspace appears stable to improved. Using Lotrisone and silver alginate to both these areas 06/13/18 ooLeft gluteal surface looks better. Now separated in the 2 wounds. No debridement required. Still drainage. We'll continue silver alginate ooLeft heel continues to look better with Iodoflex continue this for at least another week ooOf his dorsal foot wounds the area on the left still has some depth although it looks better than last week. We've been using Lotrisone and silver alginate 06/20/18 ooLeft gluteal continues to look better healthy tissue ooLeft heel continues to look better healthy granulation wound is smaller. He is using Iodoflex and his long as this continues continue the  Iodoflex ooDorsal right foot looks better unfortunately dorsal left foot does not. There is swelling and erythema of his forefoot. He had minor trauma to this several days ago but doesn't think this was enough to have caused any tissue injury. Foot looks like cellulitis, we have had this problem before 06/27/18 on evaluation today patient appears to be doing a little worse in regard to his foot ulcer. 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 Both of which would really be his only options for treating this infection. In the past he has been given and effusion of Orbactiv. This is done very well for him in the past again it's one time dosing IV antibiotic therapy. Subsequently I do believe this is something we're gonna need to see about doing at this point in time. Currently his other wounds seem to be doing somewhat better in my pinion I'm pretty happy in that regard. 07/03/18 on evaluation today patient's wounds actually appear to be doing fairly well. He has been tolerating the dressing changes without complication. All in all he seems to be showing signs of improvement. In regard to the antibiotics he has been dealing with infectious disease since I saw him last week as far as getting this scheduled. In the end [facility]'s [facility] this coming Friday. In the meantime he has been continuing to perform the dressing changes in such as previous. There does not appear to be any evidence of infection worsengin at this time. 07/10/18; ooSince I last saw this man 2 weeks ago things have actually improved. IV antibiotics of resulted in less forefoot erythema although there is still some present. He is not systemically unwell ooLeft buttock wounds o2 now have no depth there is increased epithelialization Using  silver alginate ooLeft heel still requires debridement using Iodoflex ooLeft dorsal foot still with a sizable wound about the size of a border but healthy granulation ooRight dorsal foot still with a slitlike area using silver alginate 07/18/18; the patient's cellulitis in the left foot is improved in fact I think it is on its way to resolving. ooLeft buttock wounds o2 both look better although the larger one has hypertension granulation we've been using silver alginate ooLeft heel has some thick circumferential redundant skin over the wound edge which will need to be removed today we've been using Iodoflex ooLeft dorsal foot is still a sizable wound required debridement using silver alginate ooThe right dorsal foot is just about closed only a small open area remains here 07/25/18; left foot cellulitis is resolved ooLeft buttock wounds o2 both look better. Hyper-granulation on the major area ooLeft heel as some debris over the surface  but otherwise looks a healthier wound. Using silver collagen ooRight dorsal foot is just about closed 07/31/18; arrives with our intake nurse worried about purulent drainage from the buttock. We had hyper-granulation here last week ooHis buttock wounds o2 continue to look better ooLeft heel some debris over the surface but measuring smaller. ooRight dorsal foot unfortunately has openings between the toes ooLeft foot superficial wound looks less aggravated. 08/07/18 ooButtock wounds continue to look better although some of her granulation and the larger medial wound. silver alginate ooLeft heel continues to look a lot better.silver collagen ooLeft foot superficial wound looks less stable. Requires debridement. He has a new wound superficial area on the foot on the lateral dorsal foot. ooRight foot looks better using silver alginate without Lotrisone 08/14/2018; patient was in the ER last week diagnosed with a UTI. He is now on Cefpodoxime and  Macrodantin. ooButtock wounds continued to be smaller. Using silver alginate ooLeft heel continues to look better using silver collagen ooLeft foot superficial wound looks as though it is improving ooRight dorsal foot area is just about healed. 08/21/2018; patient is completed his antibiotics for his UTI. ooHe has 2 open areas on the buttocks. There is still not closed although the surface looks satisfactory. Using silver alginate ooLeft heel continues to improve using silver collagen ooThe bilateral dorsal foot areas which are at the base of his first and second toes/possible tinea pedis are actually stable on the left but worse on the right. The area on the left required debridement of necrotic surface. After debridement I obtained a specimen for PCR culture. ooThe right dorsal foot which is been just about healed last week is now reopened 08/28/2018; culture done on the left dorsal foot showed coag negative staph both staph epidermidis and Lugdunensis. I think this is worthwhile initiating systemic treatment. I will use doxycycline given his long list of allergies. The area on the left heel slightly improved but still requiring debridement. ooThe large wound on the buttock is just about closed whereas the smaller one is larger. Using silver alginate in this area 09/04/2018; patient is completing his doxycycline for the left foot although this continues to be a very difficult wound area with very adherent necrotic debris. We are using silver alginate to all his wounds right foot left foot and the small wounds on his buttock, silver collagen on the left heel. 09/11/2018; once again this patient has intense erythema and swelling of the left forefoot. Lesser degrees of erythema in the right foot. He has a long list of allergies and intolerances. I will reinstitute doxycycline. 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 Using silver  alginate ooLeft heel also looks better using silver collagen ooUnfortunately both the areas on his feet look worse. The area on the left first second webspace is now gone through to the plantar part of his foot. The area on the left foot anteriorly is irritated with erythema and swelling in the forefoot. 09/25/2018 ooHis wound on the left plantar heel looks better. Using silver collagen ooThe area on the left buttock 2 small remnant areas. One is closed one is still open. Using silver alginate ooThe areas between both his first and second toes look worse. This in spite of long-standing antifungal therapy with ketoconazole and silver alginate which should have antifungal activity ooHe has small areas around his original wound on the left calf one is on the bottom of the original scar tissue and  one superiorly both of these are small and superficial but again given wound history in this site this is worrisome Decola, Eber E ([id number]) [id number] Page 27 of 39 10/02/2018 ooLeft plantar heel continues to gradually contract using silver collagen ooLeft buttock wound is unchanged using silver alginate ooThe areas on his dorsal feet between his first and second toes bilaterally look about the same. 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 Small open area here. We have been using silver alginate 10/09/2018; ooLeft plantar heel continues to look healthy and contract. No debridement is required ooLeft buttock slightly smaller with a tape injury wound just below which was new this week ooDorsal feet somewhat improved I have been using clindamycin ooLeft lateral looks lower extremity the actual open area looks worse although a lot of this is epithelialized. I am going to change to  silver collagen today He has a lot more swelling in the right leg although this is not pitting not red and not particularly warm there is a lot of spasm in the right leg usually indicative of people with paralysis of some underlying discomfort. We have reviewed his vascular status from 2017 he had a left greater saphenous vein ablation. I wonder about referring him back to vascular surgery if the area on the left leg continues to deteriorate. 10/16/2018 in today for follow-up and management of multiple lower extremity ulcers. His left Buttock wound is much lower smaller and almost closed completely. The wound to the left ankle has began to reopen with Epithelialization and some adherent slough. He has multiple new areas to the left foot and leg. The left dorsal foot without much improvement. Wound present between left great webspace and 2nd toe. Erythema and edema present right leg. Right LE ultrasound obtained on 10/10/18 was negative for DVT . 10/23/2018; ooLeft buttock is closed over. Still dry macerated skin but there is no open wound. I suspect this is chronic pressure/moisture ooLeft lateral calf is quite a bit worse than when I saw this last. There is clearly drainage here he has macerated skin into the left plantar heel. We will change the primary dressing to alginate ooLeft dorsal foot has some improvement in overall wound area. Still using clindamycin and silver alginate ooRight dorsal foot about the same as the left using clindamycin and silver alginate ooThe erythema in the right leg has resolved. He is DVT rule out was negative ooLeft heel pressure area required debridement although the wound is smaller and the surface is health 10/26/2018 ooThe patient came back in for his nurse check today predominantly because of the drainage coming out of the left lateral leg with a recent reopening of his original wound on the left lateral calf. He comes in today with a large amount of  surrounding erythema around the wound extending from the calf into the ankle and even in the area on the dorsal foot. He is not systemically unwell. He is not febrile. Nevertheless this looks like cellulitis. We have been using silver alginate to the area. I changed him to a regular visit and I am going to prescribe him doxycycline. The rationale here is a long list of medication intolerances and a history of MRSA. I did not see anything that I thought would provide a valuable culture 10/30/2018 ooFollow-up from his appointment 4 days ago with really an  extensive area of cellulitis in the left calf left lateral ankle and left dorsal foot. I put him on doxycycline. He has a long list of medication allergies which are true allergy reactions. 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 In any case he is a lot better today. The erythema especially in the anterior and lateral left calf is better. He still has left ankle erythema. He also is complaining about increasing edema in the right leg we have only been using Kerlix Coban and he has been doing the wraps at home. Finally he has a spotty rash on the medial part of his upper left calf which looks like folliculitis or perhaps wrap occlusion type injury. Small superficial macules not pustules 11/06/18 patient arrives today with again a considerable degree of erythema around the wound on the left lateral calf extending into the dorsal ankle and dorsal foot. This is a lot worse than when I saw this last week. He is on doxycycline really with not a lot of improvement. He has not been systemically unwell Wounds on the; left heel actually looks improved. Original area on the left foot and proximity to the first and second toes looks about the same. He has superficial areas on the dorsal foot, anterior calf and then the reopening of his original wound on the left lateral calf which looks about  the same ooThe only area he has on the right is the dorsal webspace first and second which is smaller. ooHe has a large area of dry erythematous skin on the left buttock small open area here. 11/13/2018; the patient arrives in much better condition. The erythema around the wound on the left lateral calf is a lot better. Not sure whether this was the clindamycin or the TCA and ketoconazole or just in the improvement in edema control [stasis dermatitis]. In any case this is a lot better. The area on the left heel is very small and just about resolved using silver collagen we have been using silver alginate to the areas on his dorsal feet 11/20/2018; his wounds include the left lateral calf, left heel, dorsal aspects of both feet just proximal to the first second webspace. He is stable to slightly improved. I did not think any changes to his dressings were going to be necessary 11/27/2018 he has a reopening on the left buttock which is surrounded by what looks like tinea or perhaps some other form of dermatitis. The area on the left dorsal foot has some erythema around it I have marked this area but I am not sure whether this is cellulitis or not. Left heel is not closed. Left calf the reopening is really slightly longer and probably worse 1/13; in general things look better and smaller except for the left dorsal foot. Area on the left heel is just about closed, left buttock looks better only a small wound remains in the skin looks better [using Lotrisone] 1/20; the area on the left heel only has a few remaining open areas here. Left lateral calf about the same in terms of size, left dorsal foot slightly larger right lateral foot still not closed. The area on the left buttock has no open wound and the surrounding skin looks a lot better 1/27; the area on the left heel is closed. Left lateral calf better but still requiring extensive debridements. The area on his left buttock is closed. He still has  the open areas on the  left dorsal foot which is slightly smaller in the right foot which is slightly expanded. We have been using Iodoflex on these areas as well 2/3; left heel is closed. Left lateral calf still requiring debridement using Iodoflex there is no open area on his left buttock however he has dry scaly skin over a large area of this. Not really responding well to the Lotrisone. Finally the areas on his dorsal feet at the level of the first second webspace are slightly smaller on the right and about the same on the left. Both of these vigorously debrided with Anasept and gauze 2/10; left heel remains closed he has dry erythematous skin over the left buttock but there is no open wound here. Left lateral leg has come in and with. Still requiring debridement we have been using Iodoflex here. Finally the area on the left dorsal foot and right dorsal foot are really about the same extremely dry callused fissured areas. He does not yet have a dermatology appointment 2/17; left heel remains closed. He has a new open area on the left buttock. The area on the left lateral calf is bigger longer and still covered in necrotic debris. No major change in his foot areas bilaterally. I am awaiting for a dermatologist to look on this. We have been using ketoconazole I do not know that this is been doing any good at all. 2/24; left heel remains closed. The left buttock wound that was new reopening last week looks better. The left lateral calf appears better also although still requires debridement. The major area on his foot is the left first second also requiring debridement. We have been putting Prisma on all wounds. I do not believe that the ketoconazole has done too much good for his feet. He will use Lotrisone I am going to give him a 2-week course of terbinafine. We still do not have a dermatology appointment 3/2 left heel remains closed however there is skin over bone in this area I pointed this out  to him today. The left buttock wound is epithelialized but still does not look completely stable. The area on the left leg required debridement were using silver collagen here. With regards to his feet we changed to Lotrisone last week and silver alginate. 3/9; left heel remains closed. Left buttock remains closed. The area on the right foot is essentially closed. The left foot remains unchanged. Slightly smaller on the left lateral calf. Using silver collagen to both of these areas 3/16-Left heel remains closed. Area on right foot is closed. Left lateral calf above the lateral malleolus open wound requiring debridement with easy bleeding. Left dorsal wound proximal to first toe also debrided. Left ischial area open new. Patient has been using Prisma with wrapping every 3 days. Dermatology appointment is apparently tomorrow.Patient has completed his terbinafine 2-week course with some apparent improvement according to him, there is still flaking and dry skin in his foot on the left 3/23; area on the right foot is reopened. The area on the left anterior foot is about the same still a very necrotic adherent surface. He still has the area on the left leg and reopening is on the left buttock. He apparently saw dermatology although I do not have a note. According to the patient who is usually fairly well informed they did not have any good ideas. Put him on oral terbinafine which she is been on before. Mcglothlin, MARCELLOUS SNARSKI ([id number]) [id number] Page 28 of 39 3/30; using silver collagen to all  wounds. Apparently his dermatologist put him on doxycycline and rifampin presumably some culture grew staph. I do not have this result. He remains on terbinafine although I have used terbinafine on him before 4/6; patient has had a fairly substantial reopening on the right foot between the first and second toes. He is finished his terbinafine and I believe is on doxycycline and rifampin still  as prescribed by dermatology. We have been using silver collagen to all his wounds although the patient reports that he thinks silver alginate does better on the wounds on his buttock. 4/13; the area on his left lateral calf about the same size but it did not require debridement. ooLeft dorsal foot just proximal to the webspace between the first and second toes is about the same. Still nonviable surface. I note some superficial bronze discoloration of the dorsal part of his foot ooRight dorsal foot just proximal to the first and second toes also looks about the same. I still think there may be the same discoloration I noted above on the left ooLeft buttock wound looks about the same 4/20; left lateral calf appears to be gradually contracting using silver collagen. 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 The left dorsal foot wound is debrided of tightly adherent necrotic debris and really cleans up quite nicely. The right area is worse with expansion. I did not debride this it is now over the base of the second toe ooThe area on his left buttock is smaller no debridement is required using silver collagen 5/4; left calf continues to make good progress. ooHe arrives with erythema around the wounds on his dorsal foot which even extends to the plantar aspect. Very concerning for coexistent infection. He is finished the erythromycin I gave him for possible erythrasma this does not seem to have helped. ooThe area on the left foot is about the same base of the dorsal toes ooIs area on the buttock looks improved on the left 5/11; left calf and left buttock continued to make good progress. Left foot is about the same to slightly improved. ooMajor problem is on the right foot. He has not had an x-ray. Deep tissue culture I did last week showed both Enterobacter and E. coli. I did not change the doxycycline I put him on empirically although  neither 1 of these were plated to doxycycline. He arrives today with the erythema looking worse on both the dorsal and plantar foot. Macerated skin on the bottom of the foot. he has not been systemically unwell 5/18-Patient returns at 1 week, left calf wound appears to be making some progress, left buttock wound appears slightly worse than last time, left foot wound looks slightly better, right foot redness is marginally better. X-ray of both feet show no air or evidence of osteomyelitis. Patient is finished his Omnicef and terbinafine. He continues to have macerated skin on the bottom of the left foot as well as right 5/26; left calf wound is better, left buttock wound appears to have multiple small superficial open areas with surrounding macerated skin. X-rays that I did last time showed no evidence of osteomyelitis in either foot. He is finished cefdinir and doxycycline. I do not think that he was on terbinafine. He continues to have a large superficial open area on the right foot anterior dorsal and slightly between the first and second toes. I did send him to dermatology 2 months ago or so wondering about whether they would do a fungal scraping.  I do not believe they did but did do a culture. We have been using silver alginate to the toe areas, he has been using antifungals at home topically either ketoconazole or Lotrisone. We are using silver collagen on the left foot, silver alginate on the right, silver collagen on the left lateral leg and silver alginate on the left buttock 6/1; left buttock area is healed. We have the left dorsal foot, left lateral leg and right dorsal foot. We are using silver alginate to the areas on both feet and silver collagen to the area on his left lateral calf 6/8; the left buttock apparently reopened late last week. He is not really sure how this happened. He is tolerating the terbinafine. Using silver alginate to all wounds 6/15; left buttock wound is larger than  last week but still superficial. ooCame in the clinic today with a report of purulence from the left lateral leg I did not identify any infection ooBoth areas on his dorsal feet appear to be better. He is tolerating the terbinafine. Using silver alginate to all wounds 6/22; left buttock is about the same this week, left calf quite a bit better. His left foot is about the same however he comes in with erythema and warmth in the right forefoot once again. Culture that I gave him in the beginning of May showed Enterobacter and E. coli. I gave him doxycycline and things seem to improve although neither 1 of these organisms was specifically plated. 6/29; left buttock is larger and dry this week. Left lateral calf looks to me to be improved. Left dorsal foot also somewhat improved right foot completely unchanged. The erythema on the right foot is still present. He is completing the Ceftin dinner that I gave him empirically [see discussion above.) 7/6 - All wounds look to be stable and perhaps improved, the left buttock wound is slightly smaller, per patient bleeds easily, completed ceftin, the right foot redness is less, he is on terbinafine 7/13; left buttock wound about the same perhaps slightly narrower. Area on the left lateral leg continues to narrow. Left dorsal foot slightly smaller right foot about the same. We are using silver alginate on the right foot and Hydrofera Blue to the areas on the left. Unna boot on the left 2 layer compression on the right 7/20; left buttock wound absolutely the same. Area on lateral leg continues to get better. Left dorsal foot require debridement as did the right no major change in the 7/27; left buttock wound the same size necrotic debris over the surface. The area on the lateral leg is closed once again. His left foot looks better right foot about the same although there is some involvement now of the posterior first second toe area. He is still on terbinafine  which I have given him for a month, not certain a centimeter major change 06/25/19-All wounds appear to be slightly improved according to report, left buttock wound looks clean, both foot wounds have minimal to no debris the right dorsal foot has minimal slough. We are using Hydrofera Blue to the left and silver alginate to the right foot and ischial wound. 8/10-Wounds all appear to be around the same, the right forefoot distal part has some redness which was not there before, however the wound looks clean and small. Ischial wound looks about the same with no changes 8/17; his wound on the left lateral calf which was his original chronic venous insufficiency wound remains closed. Since I last saw him the  areas on the left dorsal foot right dorsal foot generally appear better but require debridement. The area on his left initial tuberosity appears somewhat larger to me perhaps hyper granulated and bleeds very easily. We have been using Hydrofera Blue to the left dorsal foot and silver alginate to everything else 8/24; left lateral calf remains closed. The areas on his dorsal feet on the webspace of the first and second toes bilaterally both look better. The area on the left buttock which is the pressure ulcer stage II slightly smaller. I change the dressing to Hydrofera Blue to all areas 8/31; left lateral calf remains closed. The area on his dorsal feet bilaterally look better. Using Hydrofera Blue. Still requiring debridement on the left foot. No change in the left buttock pressure ulcers however 9/14; left lateral calf remains closed. Dorsal feet look quite a bit better than 2 weeks ago. Flaking dry skin also a lot better with the ammonium lactate I gave him 2 weeks ago. The area on the left buttock is improved. He states that his Roho cushion developed a leak and he is getting a new one, in the interim he is offloading this vigorously 9/21; left calf remains closed. Left heel which was a possible  DTI looks better this week. He had macerated tissue around the left dorsal foot right foot looks satisfactory and improved left buttock wound. I changed his dressings to his feet to silver alginate bilaterally. Continuing Hydrofera Blue on the left buttock. 9/28 left calf remains closed. Left heel did not develop anything [possible DTI] dry flaking skin on the left dorsal foot. Right foot looks satisfactory. Improved left buttock wound. We are using silver alginate on his feet Hydrofera Blue on the buttock. I have asked him to go back to the Lotrisone on his feet including the wounds and surrounding areas 10/5; left calf remains closed. The areas on the left and right feet about the same. A lot of this is epithelialized however debris over the remaining open areas. He is using Lotrisone and silver alginate. The area on the left buttock using Hydrofera Blue 10/26. Patient has been out for 3 weeks secondary to Covid concerns. He tested negative but I think his wife tested positive. He comes in today with the left foot substantially worse, right foot about the same. Even more concerning he states that the area on his left buttock closed over but then reopened and is considerably deeper in one aspect than it was before [stage III wound] 11/2; left foot really about the same as last week. Quarter sized wound on the dorsal foot just proximal to the first second toes. Surrounding erythema with areas of denuded epithelium. This is not really much different looking. Did not look like cellulitis this time however. ooRight foot area about the same.. We have been using silver alginate alginate on his toes ooLeft buttock still substantial irritated skin around the wound which I think looks somewhat better. We have been using Hydrofera Blue here. 11/9; left foot larger than last week and a very necrotic surface. Right foot I think is about the same perhaps slightly smaller. Debris around the circumference also  addressed. Unfortunately on the left buttock there is been a decline. Satellite lesions below the major wound distally and now a an additional one posteriorly we have been using Hydrofera Blue but I think this is a pressure issue 11/16; left foot ulcer dorsally again a very adherent necrotic surface. Right foot is about the same. Not much change  in the pressure ulcer on his left buttock. 11/30; left foot ulcer dorsally basically the same as when I saw him 2 weeks ago. Very adherent fibrinous debris on the wound surface. Patient reports a lot of Debrosse, Mitsuru E ([id number]) [id number] Page 29 of 39 drainage as well. 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 We have been using Iodoflex, patient changed back to alginate because of the drainage. Area on his right dorsal foot really looks benign with a healthier surface certainly a lot better than on the left. Left buttock wounds all improved using Hydrofera Blue 12/7; left dorsal foot again no improvement. Tightly adherent debris. PCR culture I did last week only showed likely skin contaminant. I have gone ahead and done a punch biopsy of this which is about the last thing in terms of investigations I can think to do. He has known venous insufficiency and venous hypertension and this could be the issue here. The area on the right foot is about the same left buttock slightly worse according to our intake nurse secondary to [facility] Blue sticking to the wound 12/14; biopsy of the left foot that I did last time showed changes that could be related to wound healing/chronic stasis dermatitis phenomenon no neoplasm. We have been using silver alginate to both feet. I change the one on the left today to Sorbact and silver alginate to his other 2 wounds 12/28; the patient arrives with the following problems; ooMajor issue is the dorsal left foot which continues to be a larger deeper  wound area. Still with a completely nonviable surface ooParadoxically the area mirror image on the right on the right dorsal foot appears to be getting better. ooHe had some loss of dry denuded skin from the lower part of his original wound on the left lateral calf. Some of this area looked a little vulnerable and for this reason we put him in wrap that on this side this week ooThe area on his left buttock is larger. He still has the erythematous circular area which I think is a combination of pressure, sweat. This does not look like cellulitis or fungal dermatitis 11/26/2019; -Dorsal left foot large open wound with depth. Still debris over the surface. Using Sorbact ooThe area on the dorsal right foot paradoxically has closed over [facility] has a reopening on the left ankle laterally at the base of his original wound that extended up into the calf. This appears clean. ooThe left buttock wound is smaller but with very adherent necrotic debris over the surface. We have been using silver alginate here as well The patient had arterial studies done in 2017. He had biphasic waveforms at the dorsalis pedis and posterior tibial bilaterally. ABI in the left was 1.17. Digit waveforms were dampened. He has slight spasticity in the great toes I do not think a TBI would be possible 1/11; the patient comes in today with a sizable reopening between the first and second toes on the right. This is not exactly in the same location where we have been treating wounds previously. According to our intake nurse this was actually fairly deep but 0.6 cm. The area on the left dorsal foot looks about the same the surface is somewhat cleaner using Sorbact, his MRI is in 2 days. We have not managed yet to get arterial studies. The new reopening on the left lateral calf looks somewhat better using alginate. The left buttock wound is about the same using alginate 1/18;  the patient had his ARTERIAL studies which were quite normal.  ABI in the right at 1.13 with triphasic/biphasic waveforms on the left ABI 1.06 again with triphasic/biphasic waveforms. It would not have been possible to have done a toe brachial index because of spasticity. We have been using Sorbac to the left foot alginate to the rest of his wounds on the right foot left lateral calf and left buttock 1/25; arrives in clinic with erythema and swelling of the left forefoot worse over the first MTP area. This extends laterally dorsally and but also posteriorly. Still has an area on the left lateral part of the lower part of his calf wound it is eschared and clearly not closed. ooArea on the left buttock still with surrounding irritation and erythema. ooRight foot surface wound dorsally. The area between the right and first and second toes appears better. 2/1; ooThe left foot wound is about the same. Erythema slightly better I gave him a week of doxycycline empirically ooRight foot wound is more extensive extending between the toes to the plantar surface ooLeft lateral calf really no open surface on the inferior part of his original wound however the entire area still looks vulnerable ooAbsolutely no improvement in the left buttock wound required debridement. 2/8; the left foot is about the same. Erythema is slightly improved I gave him clindamycin last week. ooRight foot looks better he is using Lotrimin and silver alginate ooHe has a breakdown in the left lateral calf. Denuded epithelium which I have removed ooLeft buttock about the same were using Hydrofera Blue 2/15; left foot is about the same there is less surrounding erythema. Surface still has tightly adherent debris which I have debriding however not making any progress ooRight foot has a substantial wound on the medial right second toe between the first and second webspace. ooStill an open area on the left lateral calf distal area. ooButtock wound is about the same 2/22; left foot is about  the same less surrounding erythema. Surface has adherent debris. Polymen Ag Right foot area significant wound between the first and second toes. We have been using silver alginate here Left lateral leg polymen Ag at the base of his original venous insufficiency wound ooLeft buttock some improvement here 3/1; ooRight foot is deteriorating in the first second toe webspace. Larger and more substantial. We have been using silver alginate. ooLeft dorsal foot about the same markedly adherent surface debris using PolyMem Ag ooLeft lateral calf surface debris using PolyMem AG ooLeft buttock is improved again using PolyMem Ag. ooHe is completing his terbinafine. The erythema in the foot seems better. He has been on this for 2 weeks 3/8; no improvement in any wound area in fact he has a small open area on the dorsal midfoot which is new this week. He has not gotten his foot x-rays yet 3/15; his x-rays were both negative for osteomyelitis of both feet. No major change in any of his wounds on the extremities however his buttock wounds are better. We have been using polymen on the buttocks, left lower leg. Iodoflex on the left foot and silver alginate on the right 3/22; arrives in clinic today with the 2 major issues are the improvement in the left dorsal foot wound which for once actually looks healthy with a nice healthy wound surface without debridement. Using Iodoflex here. Unfortunately on the left lateral calf which is in the distal part of his original wound he came to the clinic here for there was purulent drainage noted  some increased breakdown scattered around the original area and a small area proximally. We we are using polymen here will change to silver alginate today. His buttock wound on the left is better and I think the area on the right first second toe webspace is also improved 3/29; left dorsal foot looks better. Using Iodoflex. Left ankle culture from deterioration last time grew E.  coli, Enterobacter and Enterococcus. I will give him a course of cefdinir although that will not cover Enterococcus. The area on the right foot in the webspace of the first and second toe lateral first toe looks better. The area on his buttock is about healed Vascular appointment is on April 21. This is to look at his venous system vis--vis continued breakdown of the wounds on the left including the left lateral leg and left dorsal foot he. He has had previous ablations on this side 4/5; the area between the right first and second toes lateral aspect of the first toe looks better. Dorsal aspect of the left first toe on the left foot also improved. Unfortunately the left lateral lower leg is larger and there is a second satellite wound superiorly. The usual superficial abrasions on the left buttock overall better but certainly not closed 4/12; the area between the right first and second toes is improved. Dorsal aspect of the left foot also slightly smaller with a vibrant healthy looking surface. No real change in the left lateral leg and the left buttock wound is healed He has an unaffordable co-pay for Apligraf. Appointment with vein and vascular with regards to the left leg venous part of the circulation is on 4/21 4/19; we continue to see improvement in all wound areas. Although this is minor. He has his vascular appointment on 4/21. The area on the left buttock has not reopened although right in the center of this area the skin looks somewhat threatened 4/26; the left buttock is unfortunately reopened. In general his left dorsal foot has a healthy surface and looks somewhat smaller although it was not measured as such. The area between his first and second toe webspace on the right as a small wound against the first toe. The patient saw vascular surgery. The real question I was asking was about the small saphenous vein on the left. He has previously ablated left greater saphenous vein. Nothing  further was commented on on the left. Right greater saphenous vein without reflux at the saphenofemoral junction or proximal thigh there was no indication for ablation of the right greater saphenous vein duplex was negative for DVT bilaterally. They did not think there was anything from a Matson, Demarr E ([id number]) [phone].pdf Page 30 of 39 vascular surgery point of view that could be offered. They ABIs within normal limits 5/3; only small open area on the left buttock. The area on the left lateral leg which was his original venous reflux is now 2 wounds both which look clean. We are using Iodoflex on the left dorsal foot which looks healthy and smaller. He is down to a very tiny area between the right first and second toes, using silver alginate 5/10; all of his wounds appear better. We have much better edema control in 4 layer compression on the left. This may be the factor that is allowing the left foot and left lateral calf to heal. He has external compression garments at home 04/14/20-All of his wounds are progressing well, the left forefoot is practically closed, left ischium appears to be about the same,  right toe webspace is also smaller. The left lateral leg is about the same, continue using Hydrofera Blue to this, silver alginate to the ischium, Iodoflex to the toe space on the right 6/7; most of his wounds outside of the left buttock are doing well. The area on the left lateral calf and left dorsal foot are smaller. The area on the right foot in between the first and second toe webspace is barely visible although he still says there is some drainage here is the only reason I did not heal this out. ooUnfortunately the area on the left buttock almost looks like he has a skin tear from tape. He has open wound and then a large flap of skin that we are trying to get adherence over an area just next to the remaining wound 6/21; 2 week follow-up. I believe is been here  for nurse visits. Miraculously the area between his first and second toes on the left dorsal foot is closed over. Still open on the right first second web space. The left lateral calf has 2 open areas. Distally this is more superficial. The proximal area had a little more depth and required debridement of adherent necrotic material. His buttock wound is actually larger we have been using silver alginate here 6/28; the patient's area on the left foot remains closed. Still open wet area between the first and second toes on the right and also extending into the plantar aspect. We have been using silver alginate in this location. He has 2 areas on the left lower leg part of his original long wounds which I think are better. We have been using Hydrofera Blue here. Hydrofera Blue to the left buttock which is stable 7/12; left foot remains closed. Left ankle is closed. May be a small area between his right first and second toes the only truly open area is on the left buttock. We have been using Hydrofera Blue here 7/19; patient arrives with marked deterioration especially in the left foot and ankle. We did not put him in a compression wrap on the left last week in fact he wore his juxta lite stockings on either side although he does not have an underlying stocking. He has a reopening on the left dorsal foot, left lateral ankle and a new area on the right dorsal ankle. More worrisome is the degree of erythema on the left foot extending on the lateral foot into the lateral lower leg on the left 7/26; the patient had erythema and drainage from the lateral left ankle last week. Culture of this grew MRSA resistant to doxycycline and clindamycin which are the 2 antibiotics we usually use with this patient who has multiple antibiotic allergies including linezolid, trimethoprim sulfamethoxazole. I had give him an empiric doxycycline and he comes in the area certainly looks somewhat better although it is blotchy in his  lower leg. He has not been systemically unwell. He has had areas on the left dorsal foot which is a reopening, chronic wounds on the left lateral ankle. Both of these I think are secondary to chronic venous insufficiency. The area between his first and second toes is closed as far as I can tell. He had a new wrap injury on the right dorsal ankle last week. Finally he has an area on the left buttock. We have been using silver alginate to everything except the left buttock we are using Hydrofera Blue 06/30/20-Patient returns at 1 week, has been given a sample dose pack of NUZYRA which  is a tetracycline derivative [omadacycline], patient has completed those, we have been using silver alginate to almost all the wounds except the left ischium where we are using Hydrofera Blue all of them look better 8/16; since I last saw the patient he has been doing well. The area on the left buttock, left lateral ankle and left foot are all closed today. He has completed the [place] I gave him last time and tolerated this well. He still has open areas on the right dorsal ankle and in the right first second toe area which we are using silver alginate. 8/23; we put him in his bilateral external compression stockings last week as he did not have anything open on either leg except for concerning area between the right first and second toe. He comes in today with an area on the left dorsal foot slightly more proximal than the original wound, the left lateral foot but this is actually a continuation of the area he had on the left lateral ankle from last time. As well he is opened up on the left buttock again. 8/30; comes in today with things looking a lot better. The area on the left lower ankle has closed down as has the left foot but with eschar in both areas. The area on the dorsal right ankle is also epithelialized. Very little remaining of the left buttock wound. We have been using silver alginate on all wound areas 9/13;  the area in the first second toe webspace on the right has fully epithelialized. He still has some vulnerable epithelium on the right and the ankle and the dorsal foot. He notes weeping. He is using his juxta lite stocking. On the left again the left dorsal foot is closed left lateral ankle is closed. We went to the juxta lite stocking here as well. ooStill vulnerable in the left buttock although only 2 small open areas remain here 9/27; 2-week follow-up. We did not look at his left leg but the patient says everything is closed. He is a bit disturbed by the amount of edema in his left foot he is using juxta lite stockings but asking about over the toes stockings which would be 30/40, will talk to him next time. According to him there is no open wound on either the left foot or the left ankle/calf He has an open area on the dorsal right calf which I initially point a wrap injury. He has superficial remaining wound on the left ischial tuberosity been using silver alginate although he says this sticks to the wound 10/5; we gave him 2-week follow-up but he called yesterday expressing some concerns about his right foot right ankle and the left buttock. He came in early. There is still no open areas on the left leg and that still in his juxta lite stocking 10/11; he only has 1 small area on the left buttock that remains measuring millimeters 1 mm. Still has the same irritated skin in this area. We recommended zinc oxide when this eventually closes and pressure relief is meticulously is he can do this. He still has an area on the dorsal part of his right first through third toes which is a bit irritated and still open and on the dorsal ankle near the crease of the ankle. We have been using silver alginate and using his own stocking. He has nothing open on the left leg or foot 10/25; 2-week follow-up. Not nearly as good on the left buttock as I was hoping. For open  areas with 5 looking threatened small. He  has the erythematous irritated chronic skin in this area. oo1 area on the right dorsal ankle. He reports this area bleeds easily ooRight dorsal foot just proximal to the base of his toes ooWe have been using silver alginate. 11/8; 2-week follow-up. Left buttock is about the same although I do not think the wounds are in the same location we have been using silver alginate. I have asked him to use zinc oxide on the skin around the wounds. ooHe still has a small area on the right dorsal ankle he reports this bleeds easily ooRight dorsal foot just proximal to the base of the toes does not have anything open although the skin is very dry and scaly ooHe has a new opening on the nailbed of the left great toe. Nothing on the left ankle 11/29; 3-week follow-up. Left buttock has 2 open areas. And washing of these wounds today started bleeding easily. Suggesting very friable tissue. We have been using silver alginate. Right dorsal ankle which I thought was initially a wrap injury we have been using silver alginate. Nothing open between the toes that I can see. He states the area on the left dorsal toe nailbed healed after the last visit in 2 or 3 days 12/13; 3-week follow-up. His left buttock now has 3 open areas but the original 2 areas are smaller using polymen here. Surrounding skin looks better. The right dorsal ankle is closed. He has a small opening on the right dorsal foot at the level of the third toe. In general the skin looks better here. He is wearing his juxta lite stocking on the left leg says there is nothing open 11/24/2020; 3 weeks follow-up. His left buttock still has the 3 open areas. We have been using polymen but due to lack of response he changed to [facility] area. Surrounding skin is dry erythematous and irritated looking. There is no evidence of infection either bacterial or fungal however there is loss of surface epithelium ooHe still has very dry skin in his foot causing  irritation and erythema on the dorsal part of his toes. This is not responded to prolonged courses of antifungal simply looks dry and irritated 1/24; left buttock area still looks about the same he was unable to find the triad ointment that we had suggested. The area on the right lower leg just above the dorsal ankle has reopened and the areas on the right foot between the first second and second third toes and scaling on the bottom of the foot has been about the same for quite some time now. been using silver alginate to all wound areas 2/7; left buttock wound looked quite good although not much smaller in terms of surface area surrounding skin looks better. Only a few dry flaking areas on the right foot in between the first and second toes the skin generally looks better here [ammonium lactate]. Finally the area on the right dorsal ankle is closed 2/21; ooThere is no open area on the right foot even between the right first and second toe. Skin around this area dorsally and plantar aspects look better. ooHe has a reopening of the area on the right ankle just above the crease of the ankle dorsally. I continue to think that this is probably friction from spasms may be even this time with his stocking under the compression stockings. ooWounds on his left buttock look about the same there a couple of areas that have reopened. He  has a total square area of loss of epithelialization. This does not look like infection it looks like a contact dermatitis but I just cannot determine to what 3/14; there is nothing on the right foot between the first and second toes this was carefully inspected under illumination. Some chronic irritation on the dorsal part of his foot from toes 1-3 at the base. Nothing really open here substantially. Still has an area on the right foot/ankle that is actually larger and hyper Saulnier, Laszlo E ([id number]) [id number] Page 31 of 39 granulated. His  buttock area on the left is just about closed however he has chronic inflammation with loss of the surface epithelial layer 3/28; 2-week follow-up. In clinic today with a new wound on the left anterior mid tibia. Says this happened about 2 weeks ago. He is not really sure how wonders about the spasticity of his legs at night whether that could have caused this other than that he does not have a good idea. He has been using topical antibiotics and silver alginate. The area on his right dorsal ankle seems somewhat better. ooFinally everything on his left buttock is closed. 4/11; 2-week follow-up. All of his wounds are better except for the area over the ischium and left buttock which have opened up widely again. At least part of this is covered in necrotic fibrinous material another part had rolled nonviable skin. The area on the right ankle, left anterior mid tibia are both a lot better. He had no open wounds on either foot including the areas between the first and second toes 4/25; patient presents for 2-week follow-up. He states that the wounds are overall stable. He has no complaints today and states he is using Hydrofera Blue to open wounds. 5/9; have not seen this man in over a month. For my memory he has open areas on the left mid tibia and right ankle. T oday he has new open area on the right dorsal foot which we have not had a problem with recently. He has the sustained area on the left buttock He is also changed his insurance at the beginning of the year [employer]. We will need prior authorizations for debridement 5/23; patient presents for 2-week follow-up. He has prior authorizations for debridement. He denies any issues in the past 2 weeks with his wound care. He has been using Hydrofera Blue to all the wounds. He does report a circular rash to the upper left leg that is new. He denies acute signs of infection. 6/6; 2-week follow-up. The patient has open wounds on the left buttock  which are worse than the last time I saw this about a month ago. He also has a new area to me on the left anterior mid tibia with some surrounding erythema. The area on the dorsal ankle on the right is closed but I think this will be a friction injury every time this area is exposed to either our wraps or his compression stockings caused by unrelenting spasms in this leg. 6/20; 2-week follow-up. oo The patient has open wounds on the left buttock which is about the same. Using [facility] here. - The left mid tibia has a static amount of surrounding erythema. Also a raised area in the center. We have been using Hydrofera Blue here. ooooFinally he has broken down in his dorsal right foot extending between the first and second toes and going to the base of the first and second toe webspace. I have previously assumed that this  was severe venous hypertension 7/5; 2-week follow-up oo The left buttock wound actually looks better. We are using Hydrofera Blue. He has extensive skin irritation around this area and I have not really been able to get that any better. I have tried Lotrisone i.e. antifungals and steroids. More most recently we have just been using Coloplast really looks about the same. oo The left mid tibia which was new last week culture to have very resistant staph aureus. Not only methicillin-resistant but doxycycline resistant. The patient has a plethora of antibiotic allergies including sulfa, linezolid. I used topical bacitracin on this but he has not started this yet. oo In addition he has an expanding area of erythema with a wound on the dorsal right foot. I did a deep tissue culture of this area today 7/12; oo Left buttock area actually looks better surrounding skin also looks less irritated. oo Left mid tibia looks about the same. He is using bacitracin this is not worse oo Right dorsal foot looks about the same as well. oo The left first toe also looks about the  same 7/19; left buttock wound continues to improve in terms of open areas oo Left mid tibia is still concerning amount of swelling he is using bacitracin oo Dorsal left first toe somewhat smaller oo Right dorsal foot somewhat smaller 7/25; left buttock wound actually continues to improve oo Left mid tibia area has less swelling. I gave him all my samples of new Nuzyra. This seems to have helped although the wound is still open it. His abrasion closed by here oo Left dorsal great toe really no better. Still a very nonviable surface oo Right dorsal foot perhaps some better. oo We have been using bacitracin and silver nitrate to the areas on his lower legs and Hydrofera Blue to the area on the buttock. 8/16 oo Disappointed that his left buttock wound is actually more substantial. Apparently during the last nurse visit these were both very small. He has continued irritation to a large area of skin on his buttock. I have never been able to totally explain this although I think it some combination of the way he sits, pressure, moisture. He is not incontinent enough to contribute to this. oo Left dorsal great toe still fibrinous debris on the surface that I have debrided today oo Large area across the dorsal right toes. oo The area on the left anterior mid tibia has less swelling. He completed the [place]. This does not look infected although the tissue is still fried 8/30; 2-week follow-up. oo Left buttock areas not improved. We used Hydrofera Blue on this. Weeping wet with the surrounding erythema that I have not been able to control even with Lotrisone and topical Coloplast oo Left dorsal great toe looks about the same oo More substantial area again at the base of his toes on the left which is new this week. oo Area across the dorsal right toes looks improved oo The left anterior mid tibia looks like it is trying to close 9/13; 2-week follow-up. Using silver alginate on all of his  wounds. The left dorsal foot does not look any better. He has the area on the dorsal toe and also the areas at the base of all of his toes 1 through 3. On the right foot he has a similar pattern in a similar area. He has the area on his left mid tibia that looks fairly healthy. Finally the area on his left buttock looks somewhat bette 9/20; culture I  did of the left foot which was a deep tissue culture last time showed E. coli he has erythema around this wound. Still a completely necrotic surface. His right dorsal foot looks about the same. He has a very friable surface to the left anterior mid tibia. Both buttock wounds look better. We have been using silver alginate to all wounds 10/4; he has completed the cephalexin that I gave him last time for the left foot. He is using topical gentamicin under silver alginate silver alginate being applied to all the wounds. Unfortunately all the wounds look irritated on his dorsal right foot dorsal left foot left mid tibia. I wonder if this could be a silver allergy. I am going to change him to [facility] on the lower extremity. The skin on the left buttock and left posterior thigh still flaking dry and irritated. This has continued no matter what I have applied topically to this. He has a solitary open wound which by itself does not look too bad however the entire area of surrounding skin does not change no matter what we have applied here 10/18; the area on the left dorsal foot and right dorsal foot both look better. The area on the right extends into the plantar but not between his toes. We have been using silver alginate. He still has a rectangular erythematous area around the area on the left tibia. The wound itself is very small. Finally everything on his left buttock looks a little larger the skin is erythematous 11/15; patient comes in with the left dorsal and right dorsal foot distally looking somewhat better. Still nonviable surface on the left foot  which required debridement. He still has the area on the left anterior mid tibia although this looks somewhat better. He has a new area on the right lateral lower leg just above the ankle. Finally his left buttock looks terrible with multiple superficial open wounds geometric square shaped area of chronic erythema which I have not been able to sort through 11/29; right dorsal foot and left dorsal foot both look somewhat better. No debridement required. He has the fragile area on the left anterior mid tibia this looks and continues to look somewhat better. Right lateral lower leg just above the ankle we identified last time also looks better. In general the area on his left buttock looks improved. We are using Hydrofera Blue to all wound areas 12/13; right dorsal foot looks better. The area on the right lateral leg is healed. Left dorsal foot has 2 open areas both of which require debridement. The fragile area on the left anterior mid tibial looks better. Smaller area on his buttocks. Were using Hydrofera Blue 1/10; patient comes in with everything looking slightly larger and/or worse. This includes his left buttock, reopening of the left mid tibia, larger areas on the left dorsal foot and what looks to be a cellulitis on the right dorsal and plantar foot. We have been using Hydrofera Blue on all wounds. 1/17; right dorsal foot distally looks better today. The left foot has 2 open wounds that are about the same surrounding erythema. Culture I did last week showed rare Enterococcus and a multidrug resistant MRSA. Andy, JULION GATT ([id number]) [id number] Page 32 of 39 The biopsy I did on his left buttock showed "pseudoepitheliomatous ptosis/reactive hyperplasia". No malignancy they did not stain for fungus 1/24; his right distal foot is not closed dry and scaly but the wound looks like it is contracting. I did not debride anything here.  Problem on his left dorsal foot with  expanding erythema. Apparently there were problems last week getting the Elesa Hacker however it is now available at the [facility] but a week later. He is using ketoconazole and Coloplast to the left buttock along with Hydrofera Blue this actually looks quite a bit better today. 1/31; right dorsal foot again is dry and scaly but looking to contract. He has been using a moisturizer on his feet at my request but he is not sure which 1. The left dorsal foot wounds look about the same there is erythema here that I marked last week however after course of Nuzyra it certainly is not any better but not any worse either. Finally on his left buttock the skin continues to look better he has the original wound but a new substantial area towards the gluteal cleft. Almost like a skin tear. I used scissors to remove skin and subcutaneous tissue here silver nitrate and direct pressure 2/7; right dorsal foot. This does not look too much different from last week. Some erythema skin dry and scaly. No debridement. Left dorsal great toe again still not much improvement. I did remove flaking dry skin and callus from around the edge. Finally on his left buttock. The skin is somewhat better in the periwound. Surface wounds are superficial somewhat better than last week. 01/26/2022: Is a little bit of a mystery as to why his wounds fail to respond to treatments and actually seem to get worse. This is my first encounter with this patient. He was previously followed by Dr. Dellia Nims. Based upon my review of the chart, it seems that there is a little bit of a mystery as to why his wounds do not respond as anticipated to the interventions applied and sometimes even get worse. Biopsies have been performed and he was seen by dermatology in [place], but that did not shed any light on the matter. T oday, his gluteal wound is larger, with substantial drainage, rather malodorous. The food wounds are not terrible, but he has a lot of  callus and scaly skin around these. He is currently getting silver alginate on the gluteal wound, with idodoflex to the feet. He is using lotrisone on his legs for the dry, scaly skin. 02/09/2022: There has really been no change to any of his wounds. The gluteal wound less drainage and odor, but remains about the same size, the periwound skin remains oddly scaly. His lower extremity wounds also appear roughly the same size. They continue to accumulate a small amount of slough. The periwound on his feet and ankle wounds has dry eschar and loose dead skin. We have been using silver alginate on the gluteal wound and Iodoflex on his feet and ankle wounds. T the periwound around his gluteal lesion and Lotrisone on his feet and legs. o 02/23/2022: The right plantar foot wound is closed. The gluteal site looks small but has continued to produce hypertrophic granulation tissue. The foot wounds all look about the same on the dorsal surface of the right foot; on the left, there is only a small open area at the site of where his left great toenail would have been. 03/16/2022: The right ankle wound is healed. The right dorsal foot wound is about the same. The left dorsal foot wound is quite a bit smaller and the ischial wound is nearly closed. 03/30/2022: The right ankle wound reopened. Both dorsal foot wounds are quite a bit smaller. Unfortunately, he appears to have sheared part of  his ischial wound open further, perhaps during a transfer. 04/13/2022: The right ankle wound has hypertrophic granulation tissue present. The dorsal foot wounds continue to decrease in size. The ischial wound looks about the same today, no better, no worse. 04/27/2022: The right ankle wound has closed. Unfortunately, it looks like some moisture got underneath the dry skin on both of his dorsal feet and these wounds have expanded in size. The ischial wound remains the same with perhaps a little bit more slough accumulation than at our  previous visit. 05/11/2022: The right ankle wound remains closed. There is a left anterior tibial wound that is small has patchy openings with accumulated slough. The dorsum of his right foot appears to be nearly healed with just a small punctate opening. The plantar surface of his right foot has a new opening that looks like he may have picked some skin there. His sacral ulcer has hypertrophic granulation tissue but has some slough accumulation. The dorsum of his left foot has multiple open areas in a fairly ragged distribution. All of these have slough accumulated within them. 06/01/2022: The right ankle and left anterior tibial wound are both closed. Dorsum of his right foot and left foot both look substantially better with just tiny scattered openings The without any slough accumulation. He has sheared open new areas on his left gluteus and ischium. He says that his wheelchair cushion, which is air-filled, has a leak and so it keeps deflating. He is awaiting a new cushion. 06/15/2022: The right ankle wound has reopened and the fat layer is exposed. Both dorsal feet have just small openings with just a little bit of slough and eschar accumulation. The wound on his left gluteus and ischium is larger again today and has a foul odor. 06/29/2022: The right ankle wound has hypertrophic granulation tissue buildup. His dorsal foot wounds both look better with just some eschar on the surface. He has a new wound on his left lateral ankle. He is not sure how he acquired it but by appearance, it looks that he hit it on something, potentially his wheelchair or bed. The ischial wound is about the same but is cleaner without any significant purulent drainage or odor. He did not understand what the [facility] call was about and therefore he does not have the topical compounded antibiotic. 07/13/2022: The right ankle wound again has hypertrophic granulation tissue, but less so than at his previous visit. The ischial wound  has improved tremendously the use of the [facility] topical antibiotic. No significant change to the left lateral ankle wound; it is fibrotic with slough present. The skin of both of his feet, especially on the right, has a yeasty appearance. 08/10/2022: There is again hypertrophic granulation tissue on the right anterior ankle wound. Both feet are about the same. The left lateral ankle wound is a little bit desiccated and has some slough buildup. He unfortunately suffered a new injury when he was removing his pants and they caught his bandage which caused a large skin tear on his left ischium, just distal to the existing wound. The existing left ischial wound, however, is significantly better with just a little light slough on the surface. 08/31/2022: The right anterior ankle wound is a lot smaller today underneath some eschar. No accumulation of hypertrophic granulation tissue. The left lateral ankle wound has some slough on the surface but is better in terms of moisture balance this week. The left dorsal foot does not really have any openings on it today. The  right dorsal foot has some slough and eschar accumulation. His gluteal ulcer is basically closed aside from 2 small areas that are oozing a bit. 09/21/2022: The right anterior ankle wound is down to just a tiny pinhole. The left lateral ankle wound has accumulated slough again, but moisture balance is good. Left and right dorsal feet both have 2 small openings with some slough in them. The gluteal ulcer, unfortunately has opened up substantially. 10/05/2022: The right anterior ankle wound has reopened and has a fair amount of slough on the surface. The left lateral ankle wound has accumulated more slough, as well. He was approved for Apligraf, but the wound is not clean enough yet. There is some slough buildup on the dorsal right foot wound, as well as on his ischium. He did not pick up the doxycycline I prescribed for the MRSA that grew out of  his ankle wound culture. 10/26/2022: The right anterior ankle wound has closed again. The left lateral ankle wound looks quite a bit better. It is ready for Apligraf but we do not have 1 here for him. His ischium continues to get worse, with the wound larger and deeper. It really seems like this is a combination of pressure and friction. He has 2 new wounds, 1 on the plantar aspect of each foot. He says that he had some dry skin there that peeled away. Both are superficial. The dorsum of his left foot does not have any new wound opening. The dorsum of his right foot has a bit of slough accumulation. Patient History Whitesel, KANISHK STROEBEL ([id number]) [id number] Page 77 of 39 Information obtained from Patient. Family History Diabetes - Father, Hypertension - Mother, No family history of Cancer, Heart Disease, Hereditary Spherocytosis, Kidney Disease, Lung Disease, Stroke, Thyroid Problems, Tuberculosis. Social History Former smoker, Marital Status - Married, Alcohol Use - Moderate, Drug Use - No History, Caffeine Use - Daily. Medical History Ear/Nose/Mouth/Throat Denies history of Chronic sinus problems/congestion, Middle ear problems Hematologic/Lymphatic Denies history of Anemia, Hemophilia, Human Immunodeficiency Virus, Lymphedema, Sickle Cell Disease Respiratory Patient has history of Sleep Apnea - does not tolerate cpap Denies history of Aspiration, Asthma, Chronic Obstructive Pulmonary Disease (COPD), Pneumothorax, Tuberculosis Cardiovascular Patient has history of Hypertension - lisinopril HCTZ Denies history of Angina, Arrhythmia, Congestive Heart Failure, Coronary Artery Disease, Deep Vein Thrombosis, Hypotension, Myocardial Infarction, Peripheral Arterial Disease, Peripheral Venous Disease, Phlebitis, Vasculitis Gastrointestinal Denies history of Cirrhosis , Colitis, Crohnoos, Hepatitis A, Hepatitis B, Hepatitis C Endocrine Denies history of Type I Diabetes,  Type II Diabetes Genitourinary Denies history of End Stage Renal Disease Immunological Denies history of Lupus Erythematosus, Raynaudoos, Scleroderma Integumentary (Skin) Denies history of History of Burn Musculoskeletal Denies history of Gout, Rheumatoid Arthritis, Osteoarthritis, Osteomyelitis Neurologic Patient has history of Paraplegia Denies history of Dementia, Neuropathy, Quadriplegia, Seizure Disorder Oncologic Denies history of Received Chemotherapy, Received Radiation Psychiatric Denies history of Anorexia/bulimia, Confinement Anxiety Hospitalization/Surgery History - cellulitis in leg. - left leg vein ablation. Objective Constitutional Slightly hypertensive. Tachycardic, asymptomatic.Marland Kitchen No acute distress. Vitals Time Taken: 8:28 AM, Height: 70 in, Weight: 216 lbs, BMI: 31, Temperature: 97.9 F, Pulse: 128 bpm, Respiratory Rate: 18 breaths/min, Blood Pressure: 141/82 mmHg. Respiratory Normal work of breathing on room air. General Notes: 10/26/2022: The right anterior ankle wound has closed again. The left lateral ankle wound looks quite a bit better. His ischium continues to get worse, with the wound larger and deeper. He has 2 new wounds, 1 on the plantar aspect of each foot. He says that  he had some dry skin there that peeled away. Both are superficial. The dorsum of his left foot does not have any new wound opening. The dorsum of his right foot has a bit of slough accumulation. Integumentary (Hair, Skin) Wound #52 status is Open. Original cause of wound was Gradually Appeared. The date acquired was: 03/27/2021. The wound has been in treatment 82 weeks. The wound is located on the Right,Dorsal Foot. The wound measures 0.5cm length x 3.5cm width x 0.1cm depth; 1.374cm^2 area and 0.137cm^3 volume. There is Fat Layer (Subcutaneous Tissue) exposed. There is no tunneling or undermining noted. There is a medium amount of serosanguineous drainage noted. The wound margin is flat and  intact. There is medium (34-66%) red granulation within the wound bed. There is a medium (34-66%) amount of necrotic tissue within the wound bed including Adherent Slough. The periwound skin appearance had no abnormalities noted for texture. The periwound skin appearance exhibited: Dry/Scaly, Erythema. The surrounding wound skin color is noted with erythema. Erythema is measured at 3 cm. Periwound temperature was noted as No Abnormality. Wound #56 status is Open. Original cause of wound was Gradually Appeared. The date acquired was: 07/11/2021. The wound has been in treatment 66 weeks. The wound is located on the Left,Dorsal Foot. The wound measures 0.5cm length x 1.2cm width x 0.1cm depth; 0.471cm^2 area and 0.047cm^3 volume. There is Fat Layer (Subcutaneous Tissue) exposed. There is no tunneling or undermining noted. There is a small amount of serous drainage noted. The wound margin is flat and intact. There is small (1-33%) red, pink granulation within the wound bed. There is a small (1-33%) amount of necrotic tissue within the wound bed including Adherent Slough. The periwound skin appearance had no abnormalities noted for texture. The periwound skin appearance exhibited: Dry/Scaly, Erythema. The periwound skin appearance did not exhibit: Maceration. The surrounding wound skin color is noted with erythema. Periwound temperature was noted as Hot. Wound #65 status is Open. Original cause of wound was Pressure Injury. The date acquired was: 05/24/2022. The wound has been in treatment 21 weeks. The wound is located on the Left Upper Leg. The wound measures 6cm length x 6.2cm width x 0.1cm depth; 29.217cm^2 area and 2.922cm^3 volume. There is Fat Layer (Subcutaneous Tissue) exposed. There is no tunneling or undermining noted. There is a large amount of serosanguineous drainage noted. The wound margin is flat and intact. There is large (67-100%) red, friable granulation within the wound bed. There is a  small (1-33%) amount of necrotic tissue within the wound bed including Adherent Slough. The periwound skin appearance had no abnormalities noted for color. The periwound skin appearance exhibited: Pupo, Caedon E ([id number]) [id number] Page 34 of 39 Excoriation, Scarring, Dry/Scaly, Maceration. Periwound temperature was noted as No Abnormality. Wound #66 status is Open. Original cause of wound was Pressure Injury. The date acquired was: 06/15/2022. The wound has been in treatment 19 weeks. The wound is located on the Right,Anterior Ankle. The wound measures 0.1cm length x 0.1cm width x 0.1cm depth; 0.008cm^2 area and 0.001cm^3 volume. There is Fat Layer (Subcutaneous Tissue) exposed. There is no tunneling or undermining noted. There is a medium amount of serosanguineous drainage noted. The wound margin is flat and intact. There is large (67-100%) red, friable granulation within the wound bed. There is a small (1-33%) amount of necrotic tissue within the wound bed including Adherent Slough. The periwound skin appearance had no abnormalities noted for texture. The periwound skin appearance had no abnormalities noted for moisture.  The periwound skin appearance exhibited: Erythema. The surrounding wound skin color is noted with erythema. Erythema is measured at 1.5 cm. Periwound temperature was noted as No Abnormality. Wound #67 status is Open. Original cause of wound was Gradually Appeared. The date acquired was: 06/22/2022. The wound has been in treatment 17 weeks. The wound is located on the Left,Lateral Lower Leg. The wound measures 2.1cm length x 0.9cm width x 0.1cm depth; 1.484cm^2 area and 0.148cm^3 volume. There is Fat Layer (Subcutaneous Tissue) exposed. There is no tunneling or undermining noted. There is a medium amount of serosanguineous drainage noted. The wound margin is distinct with the outline attached to the wound base. There is large (67-100%) red granulation  within the wound bed. There is no necrotic tissue within the wound bed. The periwound skin appearance had no abnormalities noted for texture. The periwound skin appearance exhibited: Dry/Scaly, Erythema. The surrounding wound skin color is noted with erythema with red streaks. Periwound temperature was noted as No Abnormality. Wound #69 status is Open. Original cause of wound was Gradually Appeared. The date acquired was: 10/26/2022. The wound is located on the [place]. The wound measures 1.5cm length x 1cm width x 0.1cm depth; 1.178cm^2 area and 0.118cm^3 volume. There is Fat Layer (Subcutaneous Tissue) exposed. There is no tunneling or undermining noted. There is a medium amount of serous drainage noted. The wound margin is distinct with the outline attached to the wound base. There is large (67-100%) red granulation within the wound bed. There is a small (1-33%) amount of necrotic tissue within the wound bed including Adherent Slough. The periwound skin appearance had no abnormalities noted for texture. The periwound skin appearance had no abnormalities noted for color. The periwound skin appearance exhibited: Dry/Scaly. Periwound temperature was noted as No Abnormality. Wound #70 status is Open. Original cause of wound was Gradually Appeared. The date acquired was: 10/26/2022. The wound is located on the [place]. The wound measures 0.6cm length x 1.9cm width x 0.1cm depth; 0.895cm^2 area and 0.09cm^3 volume. There is Fat Layer (Subcutaneous Tissue) exposed. There is no tunneling or undermining noted. There is a medium amount of serous drainage noted. The wound margin is distinct with the outline attached to the wound base. There is large (67-100%) pink granulation within the wound bed. There is no necrotic tissue within the wound bed. The periwound skin appearance had no abnormalities noted for moisture. The periwound skin appearance exhibited: Callus, Erythema. The surrounding  wound skin color is noted with erythema with red streaks. Periwound temperature was noted as No Abnormality. 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 There was a Selective/Open Wound Non-Viable Tissue Debridement with a total area of 1.75 sq cm performed by Fredirick Maudlin, MD. With the following instrument(s): Curette to remove Non-Viable tissue/material. Material removed includes Eschar and Slough and. No specimens were taken. A time out was conducted at 08:54, prior to the start of the procedure. A Minimum  amount of bleeding was controlled with Pressure. The procedure was tolerated well. Post Debridement Measurements: 0.5cm length x 3.5cm width x 0.1cm depth; 0.137cm^3 volume. Character of Wound/Ulcer Post Debridement is improved. Post procedure Diagnosis Wound #52: Same as Pre-Procedure General Notes: scribed for Dr. Celine Ahr by Adline Peals, RN. Wound #65 Pre-procedure diagnosis of Wound #65 is a Pressure Ulcer located on the Left Upper Leg . There was a Excisional Skin/Subcutaneous Tissue Debridement with a total area of 20 sq cm performed by Fredirick Maudlin, MD. With the following instrument(s): Curette to remove Non-Viable tissue/material. Material removed includes Subcutaneous Tissue and Slough and. No specimens were taken. A time out was conducted  at 08:54, prior to the start of the procedure. A Minimum amount of bleeding was controlled with Pressure. The procedure was tolerated well. Post Debridement Measurements: 6cm length x 6.2cm width x 0.1cm depth; 2.922cm^3 volume. Post debridement Stage noted as Category/Stage III. Character of Wound/Ulcer Post Debridement is improved. Post procedure Diagnosis Wound #65: Same as Pre-Procedure General Notes: scribed for Dr. Celine Ahr by Adline Peals, RN. Wound #67 Pre-procedure diagnosis of Wound #67 is a Venous Leg Ulcer located on the Left,Lateral Lower Leg .Severity of Tissue Pre Debridement is: Fat layer exposed. There was a Excisional Skin/Subcutaneous Tissue Debridement with a total area of 1.89 sq cm performed by Fredirick Maudlin, MD. With the following instrument(s): Curette to remove Non-Viable tissue/material. Material removed includes Subcutaneous Tissue and Slough and. No specimens were taken. A time out was conducted at 08:54, prior to the start of the procedure. A Minimum amount of bleeding was controlled with Pressure. The procedure was tolerated well. Post Debridement Measurements: 2.1cm length x 0.9cm width x 0.1cm depth; 0.148cm^3 volume. Character of Wound/Ulcer Post Debridement is improved. Severity of Tissue Post Debridement is: Fat layer exposed. Post procedure Diagnosis Wound #67: Same as Pre-Procedure General Notes: scribed for Dr. Celine Ahr by Adline Peals, RN. Wound #69 Pre-procedure diagnosis of Wound #69 is a T be determined located on the Right,Plantar Foot . There was a Selective/Open Wound Non-Viable Tissue o Debridement with a total area of 2.85 sq cm performed by Fredirick Maudlin, MD. With the following instrument(s): Curette to remove Non-Viable tissue/material. Material removed includes [facility]. No specimens were taken. A time out was conducted at 08:54, prior to the start of the procedure. A Minimum amount of bleeding was controlled with Pressure. The procedure  was tolerated well. Post Debridement Measurements: 1.5cm length x 1cm width x Wildermuth, Bentlee E ([id number]) [id number] Page 35 of 39 0.1cm depth; 0.118cm^3 volume. Character of Wound/Ulcer Post Debridement is improved. Post procedure Diagnosis Wound #69: Same as Pre-Procedure General Notes: scribed for Dr. Celine Ahr by Adline Peals, RN. Wound #70 Pre-procedure diagnosis of Wound #70 is a T be determined located on the Left,Plantar Foot . There was a Selective/Open Wound Skin/Epidermis o Debridement with a total area of 1.71 sq cm performed by Fredirick Maudlin, MD. With the following instrument(s): Curette to remove Non-Viable tissue/material. Material removed includes Skin: Epidermis. No specimens were taken. A time out was conducted at 08:54, prior to the start of the procedure. A Minimum amount of bleeding was controlled with Pressure. The procedure was tolerated well. Post Debridement Measurements: 0.6cm length x 1.9cm width x 0.1cm depth; 0.09cm^3 volume. Character of Wound/Ulcer Post Debridement is improved. Post procedure Diagnosis Wound #70: Same as Pre-Procedure General Notes: scribed for Dr. Celine Ahr by Adline Peals, RN. Plan Follow-up Appointments: Return Appointment in 2 weeks. - Dr. Celine Ahr RM 1 Bathing/ Shower/ Hygiene: May shower  and wash wound with soap and water. - on days that dressing is changed Edema Control - Lymphedema / SCD / Other: Elevate legs to the level of the heart or above for 30 minutes daily and/or when sitting, a frequency of: - throughout the day Moisturize legs daily. - using Aquaphor generously to both legs and feet with dressing changes Compression stocking or Garment 30-40 mm/Hg pressure to: - Juxtalite to both legs daily Off-Loading: Roho cushion for wheelchair Turn and reposition every 2 hours WOUND #52: - Foot Wound Laterality: Dorsal, Right Cleanser: Soap and Water Every Other Day/30 Days Discharge Instructions: May  shower and wash wound with dial antibacterial soap and water prior to dressing change. Cleanser: Wound Cleanser Every Other Day/30 Days Discharge Instructions: Cleanse the wound with wound cleanser prior to applying a clean dressing using gauze sponges, not tissue or cotton balls. Peri-Wound Care: Sween Lotion (Moisturizing lotion) Every Other Day/30 Days Discharge Instructions: Apply Aquaphor moisturizing lotion as directed Prim Dressing: [place] 2x2 (in/in) Every Other Day/30 Days ary Discharge Instructions: Apply to wound bed as instructed Secondary Dressing: Woven Gauze Sponge, Non-Sterile 4x4 in Every Other Day/30 Days Discharge Instructions: Apply over primary dressing as directed. Secured With: [employer], 4.5x3.1 (in/yd) (Generic) Every Other Day/30 Days Discharge Instructions: Secure with Kerlix as directed. Secured With: 59M Medipore H Soft Cloth Surgical T ape, 4 x 10 (in/yd) (Generic) Every Other Day/30 Days Discharge Instructions: Secure with tape as directed. Com pression Stockings: Circaid Juxta Lite Compression Wrap Compression Amount: 30-40 mmHg (right) Discharge Instructions: Apply Circaid Juxta Lite Compression Wrap daily as instructed. Apply first thing in the morning, remove at night before bed. WOUND #56: - Foot Wound Laterality: Dorsal, Left Cleanser: Soap and Water Every Other Day/30 Days Discharge Instructions: May shower and wash wound with dial antibacterial soap and water prior to dressing change. Cleanser: Wound Cleanser Every Other Day/30 Days Discharge Instructions: Cleanse the wound with wound cleanser prior to applying a clean dressing using gauze sponges, not tissue or cotton balls. Peri-Wound Care: Sween Lotion (Moisturizing lotion) Every Other Day/30 Days Discharge Instructions: Apply Aquaphor moisturizing lotion as directed Prim Dressing: [place] 2x2 (in/in) Every Other Day/30 Days ary Discharge Instructions: Apply to  wound bed as instructed Secondary Dressing: Woven Gauze Sponge, Non-Sterile 4x4 in Every Other Day/30 Days Discharge Instructions: Apply over primary dressing as directed. Secured With: [employer], 4.5x3.1 (in/yd) (Generic) Every Other Day/30 Days Discharge Instructions: Secure with Kerlix as directed. Secured With: 59M Medipore H Soft Cloth Surgical T ape, 4 x 10 (in/yd) (Generic) Every Other Day/30 Days Discharge Instructions: Secure with tape as directed. WOUND #65: - Upper Leg Wound Laterality: Left Cleanser: Soap and Water Every Other Day/30 Days Discharge Instructions: May shower and wash wound with dial antibacterial soap and water prior to dressing change. Cleanser: Wound Cleanser Every Other Day/30 Days Discharge Instructions: Cleanse the wound with wound cleanser prior to applying a clean dressing using gauze sponges, not tissue or cotton balls. Peri-Wound Care: Zinc Oxide Ointment 30g tube Every Other Day/30 Days Discharge Instructions: Apply Zinc Oxide to periwound with each dressing change Topical: Keystone antibiotic compound Every Other Day/30 Days Discharge Instructions: thin layer to wound bed, use daily once available Prim Dressing: [place]ed Sorbact Swab Every Other Day/30 Days ary Discharge Instructions: Apply to wound bed as instructed Secondary Dressing: Zetuvit Plus Silicone Border Dressing 7x7(in/in) Every Other Day/30 Days Discharge Instructions: Apply silicone border over primary dressing as directed. WOUND #66: - Ankle  Wound Laterality: Right, Anterior Cleanser: Soap and Water Every Other Day/30 Days Discharge Instructions: May shower and wash wound with dial antibacterial soap and water prior to dressing change. Cleanser: Wound Cleanser Every Other Day/30 Days Discharge Instructions: Cleanse the wound with wound cleanser prior to applying a clean dressing using gauze sponges, not tissue or cotton balls. Peri-Wound Care: Sween Lotion (Moisturizing lotion)  Every Other Day/30 Days Discharge Instructions: Apply Aquaphor moisturizing lotion as directed Prim Dressing: [place] 2x2 (in/in) Every Other Day/30 Days ary Discharge Instructions: Apply to wound bed as instructed Secured With: Kerlix Roll Sterile, 4.5x3.1 (in/yd) (Generic) Every Other Day/30 Days Discharge Instructions: Secure with Kerlix as directed. Secured With: 94M Medipore H Soft Cloth Surgical T ape, 4 x 10 (in/yd) (Generic) Every Other Day/30 Days Ponciano, Arjen E ([id number]) [id number] Page 36 of 39 Discharge Instructions: Secure with tape as directed. WOUND #67: - Lower Leg Wound Laterality: Left, Lateral Cleanser: Soap and Water Every Other Day/30 Days Discharge Instructions: May shower and wash wound with dial antibacterial soap and water prior to dressing change. Cleanser: Wound Cleanser Every Other Day/30 Days Discharge Instructions: Cleanse the wound with wound cleanser prior to applying a clean dressing using gauze sponges, not tissue or cotton balls. Peri-Wound Care: Triamcinolone 15 (g) Every Other Day/30 Days Discharge Instructions: Use triamcinolone 15 (g) as directed Peri-Wound Care: Sween Lotion (Moisturizing lotion) Every Other Day/30 Days Discharge Instructions: Apply Aquaphor moisturizing lotion as directed Topical: Mupirocin Ointment Every Other Day/30 Days Discharge Instructions: Apply Mupirocin (Bactroban) as instructed Prim Dressing: [place]bal[place] [employer] Dressing 2x2 (in/in) Every Other Day/30 Days ary Discharge Instructions: Apply to wound bed as instructed Secondary Dressing: Woven Gauze Sponge, Non-Sterile 4x4 in Every Other Day/30 Days Discharge Instructions: Apply over primary dressing as directed. Secured With: [employer], 4.5x3.1 (in/yd) (Generic) Every Other Day/30 Days Discharge Instructions: Secure with Kerlix as directed. Secured With: 94M Medipore H Soft Cloth Surgical T ape, 4 x 10 (in/yd) (Generic) Every  Other Day/30 Days Discharge Instructions: Secure with tape as directed. WOUND #69: - Foot Wound Laterality: Plantar, Right Cleanser: Soap and Water Every Other Day/30 Days Discharge Instructions: May shower and wash wound with dial antibacterial soap and water prior to dressing change. Cleanser: Wound Cleanser Every Other Day/30 Days Discharge Instructions: Cleanse the wound with wound cleanser prior to applying a clean dressing using gauze sponges, not tissue or cotton balls. Peri-Wound Care: Sween Lotion (Moisturizing lotion) Every Other Day/30 Days Discharge Instructions: Apply Aquaphor moisturizing lotion as directed Prim Dressing: [place] 2x2 (in/in) Every Other Day/30 Days ary Discharge Instructions: Apply to wound bed as instructed Secured With: Kerlix Roll Sterile, 4.5x3.1 (in/yd) (Generic) Every Other Day/30 Days Discharge Instructions: Secure with Kerlix as directed. Secured With: 94M Medipore H Soft Cloth Surgical T ape, 4 x 10 (in/yd) (Generic) Every Other Day/30 Days Discharge Instructions: Secure with tape as directed. WOUND #70: - Foot Wound Laterality: Plantar, Left Cleanser: Soap and Water Every Other Day/30 Days Discharge Instructions: May shower and wash wound with dial antibacterial soap and water prior to dressing change. Cleanser: Wound Cleanser Every Other Day/30 Days Discharge Instructions: Cleanse the wound with wound cleanser prior to applying a clean dressing using gauze sponges, not tissue or cotton balls. Peri-Wound Care: Sween Lotion (Moisturizing lotion) Every Other Day/30 Days Discharge Instructions: Apply Aquaphor moisturizing lotion as directed Prim Dressing: [place] 2x2 (in/in) Every Other Day/30 Days ary Discharge Instructions: Apply to wound bed as instructed Secured With: Kerlix Roll Sterile, 4.5x3.1 (  in/yd) (Generic) Every Other Day/30 Days Discharge Instructions: Secure with Kerlix as directed. Secured With: 47M Medipore H  Soft Cloth Surgical T ape, 4 x 10 (in/yd) (Generic) Every Other Day/30 Days Discharge Instructions: Secure with tape as directed. 10/26/2022: The right anterior ankle wound has closed again. The left lateral ankle wound looks quite a bit better. His ischium continues to get worse, with the wound larger and deeper. He has 2 new wounds, 1 on the plantar aspect of each foot. He says that he had some dry skin there that peeled away. Both are superficial. The dorsum of his left foot does not have any new wound opening. The dorsum of his right foot has a bit of slough accumulation. I used a curette to debride slough and nonviable subcutaneous tissue from the left lateral ankle wound and from the ischial wound. I debrided dead skin from both of the new plantar foot wounds. I debrided slough and eschar from the right dorsal foot wound. We will continue mupirocin and silver alginate to the left lateral ankle wound and silver alginate to the other foot wounds. Continue Keystone topical antimicrobial compound to the ischial wound. I am going to change the contact layer to Sorbact to see if this has any effect at all. We will need to use Zetuvit or similar highly absorbent dressing in addition. I suspect much of the worsening tissue damage is related to his transferring as well as moisture. The ankle is ready for Apligraf so we will make sure we have 1 available for him at his next visit. Follow-up in 2 weeks. Electronic Signature(s) Signed: 10/26/2022 12:43:55 PM By: Fredirick Maudlin MD FACS Signed: 10/26/2022 4:17:50 PM By: Adline Peals Previous Signature: 10/26/2022 9:47:33 AM Version By: Fredirick Maudlin MD FACS Previous Signature: 10/26/2022 9:46:53 AM Version By: Fredirick Maudlin MD FACS Entered By: Adline Peals on 10/26/2022 09:57:20 -------------------------------------------------------------------------------- HxROS Details Patient Name: Date of Service: Couse, [name] 10/26/2022 8:30 A  M Medical Record Number: [id number] Patient Account Number: [id number] Date of Birth/Sex: Treating RN: 1986-06-11 (36 y.o. M) Primary Care Provider: [place], [place] Other Clinician: Referring Provider: Treating Provider/Extender: Graciela Husbands, [name]: [place] Altmann, Abdul E ([id number]) [id number] Page 37 of 39 Information Obtained From Patient Ear/Nose/Mouth/Throat Medical History: Negative for: Chronic sinus problems/congestion; Middle ear problems Hematologic/Lymphatic Medical History: Negative for: Anemia; Hemophilia; Human Immunodeficiency Virus; Lymphedema; Sickle Cell Disease Respiratory Medical History: Positive for: Sleep Apnea - does not tolerate cpap Negative for: Aspiration; Asthma; Chronic Obstructive Pulmonary Disease (COPD); Pneumothorax; Tuberculosis Cardiovascular Medical History: Positive for: Hypertension - lisinopril HCTZ Negative for: Angina; Arrhythmia; Congestive Heart Failure; Coronary Artery Disease; Deep Vein Thrombosis; Hypotension; Myocardial Infarction; Peripheral Arterial Disease; Peripheral Venous Disease; Phlebitis; Vasculitis Gastrointestinal Medical History: Negative for: Cirrhosis ; Colitis; Crohns; Hepatitis A; Hepatitis B; Hepatitis C Endocrine Medical History: Negative for: Type I Diabetes; Type II Diabetes Genitourinary Medical History: Negative for: End Stage Renal Disease Immunological Medical History: Negative for: Lupus Erythematosus; Raynauds; Scleroderma Integumentary (Skin) Medical History: Negative for: History of Burn Musculoskeletal Medical History: Negative for: Gout; Rheumatoid Arthritis; Osteoarthritis; Osteomyelitis Neurologic Medical History: Positive for: Paraplegia Negative for: Dementia; Neuropathy; Quadriplegia; Seizure Disorder Oncologic Medical History: Negative for: Received Chemotherapy; Received Radiation Psychiatric Medical History: Negative for:  Anorexia/bulimia; Confinement Anxiety Immunizations Pneumococcal Vaccine: Received Pneumococcal Vaccination: No Immunization Notes: doesn't remember when last tetanus shot was Logan, Early E ([id number]) [id number] Page 38 of 39 Implantable Devices No devices added Hospitalization / Surgery History Type of Hospitalization/Surgery cellulitis in  leg left leg vein ablation Family and Social History Cancer: No; Diabetes: Yes - Father; Heart Disease: No; Hereditary Spherocytosis: No; Hypertension: Yes - Mother; Kidney Disease: No; Lung Disease: No; Stroke: No; Thyroid Problems: No; Tuberculosis: No; Former smoker; Marital Status - Married; Alcohol Use: Moderate; Drug Use: No History; Caffeine Use: Daily; Financial Concerns: No; Food, Clothing or Shelter Needs: No; Support System Lacking: No; Transportation Concerns: No Electronic Signature(s) Signed: 10/26/2022 12:43:55 PM By: Fredirick Maudlin MD FACS Entered By: Fredirick Maudlin on 10/26/2022 09:42:30 -------------------------------------------------------------------------------- SuperBill Details Patient Name: Date of Service: Hout, [name] 10/26/2022 Medical Record Number: [id number] Patient Account Number: [id number] Date of Birth/Sex: Treating RN: [birth year] (36 y.o. 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, 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 Non-pressure chronic ulcer of other part of left foot limited to  breakdown 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 Facility Procedures : CPT4 Code: 25638937 Description: 11042 - DEB SUBQ TISSUE 20 SQ CM/< ICD-10 Diagnosis Description 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, stage 3 Modifier: Quantity: 1 : CPT4 Code: 34287681 Description: 11045 - DEB SUBQ TISS EA ADDL 20CM ICD-10 Diagnosis Description 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, stage 3 Modifier: Quantity: 1 : CPT4 Code: 15726203 Description: 55974 - DEBRIDE WOUND 1ST 20 SQ CM OR < ICD-10 Diagnosis Description L97.518 Non-pressure chronic ulcer of other part of right foot with other specified sever L97.511 Non-pressure chronic ulcer of other part of right foot limited to  breakdown of sk L97.521 Non-pressure chronic ulcer of other part of left foot limited to breakdown of ski Modifier: ity in n Quantity: 1 Physician Procedures : CPT4 Code Description Modifier [id number] - WC PHYS LEVEL 4 - EST PT 25 Maugeri, Kalel E ([id number]) [id number] ICD-10 Diagnosis Description 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, stage 3 L97.511 Non-pressure chronic ulcer of  other part of right foot limited to breakdown of skin L97.521 Non-pressure chronic ulcer of other part of left foot limited to breakdown of skin L97.322 Non-pressure chronic ulcer of left 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 Quantity: 1 27.pdf Page 39 of 39 : [id number] 11042 - WC PHYS SUBQ TISS 20 SQ CM 1 ICD-10 Diagnosis Description 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, stage 3 Quantity: : [id number] 11045 - WC PHYS SUBQ TISS EA ADDL 20 CM 1 ICD-10 Diagnosis Description L97.322 Non-pressure chronic ulcer of  left 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

## 2022-11-04 DIAGNOSIS — L89224 Pressure ulcer of left hip, stage 4: Secondary | ICD-10-CM | POA: Diagnosis not present

## 2022-11-09 ENCOUNTER — Encounter (HOSPITAL_BASED_OUTPATIENT_CLINIC_OR_DEPARTMENT_OTHER): Payer: BC Managed Care – PPO | Admitting: General Surgery

## 2022-11-10 DIAGNOSIS — Z79899 Other long term (current) drug therapy: Secondary | ICD-10-CM | POA: Diagnosis not present

## 2022-11-10 DIAGNOSIS — R350 Frequency of micturition: Secondary | ICD-10-CM | POA: Diagnosis not present

## 2022-11-10 DIAGNOSIS — I10 Essential (primary) hypertension: Secondary | ICD-10-CM | POA: Diagnosis not present

## 2022-11-10 DIAGNOSIS — M62838 Other muscle spasm: Secondary | ICD-10-CM | POA: Diagnosis not present

## 2022-11-24 ENCOUNTER — Encounter (HOSPITAL_BASED_OUTPATIENT_CLINIC_OR_DEPARTMENT_OTHER): Payer: BC Managed Care – PPO | Attending: General Surgery | Admitting: General Surgery

## 2022-11-24 DIAGNOSIS — L98491 Non-pressure chronic ulcer of skin of other sites limited to breakdown of skin: Secondary | ICD-10-CM | POA: Insufficient documentation

## 2022-11-24 DIAGNOSIS — L89893 Pressure ulcer of other site, stage 3: Secondary | ICD-10-CM | POA: Diagnosis not present

## 2022-11-24 DIAGNOSIS — G8220 Paraplegia, unspecified: Secondary | ICD-10-CM | POA: Diagnosis not present

## 2022-11-24 DIAGNOSIS — I87332 Chronic venous hypertension (idiopathic) with ulcer and inflammation of left lower extremity: Secondary | ICD-10-CM | POA: Insufficient documentation

## 2022-11-24 DIAGNOSIS — I872 Venous insufficiency (chronic) (peripheral): Secondary | ICD-10-CM | POA: Diagnosis not present

## 2022-11-24 DIAGNOSIS — L97511 Non-pressure chronic ulcer of other part of right foot limited to breakdown of skin: Secondary | ICD-10-CM | POA: Diagnosis not present

## 2022-11-24 DIAGNOSIS — L97312 Non-pressure chronic ulcer of right 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: Secondary | ICD-10-CM | POA: Diagnosis not present

## 2022-11-24 DIAGNOSIS — L97322 Non-pressure chronic ulcer of left 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: Secondary | ICD-10-CM | POA: Diagnosis not present

## 2022-11-24 DIAGNOSIS — L97822 Non-pressure chronic ulcer of other part of left lower leg with fat layer exposed: Secondary | ICD-10-CM | POA: Diagnosis not present

## 2022-11-24 DIAGNOSIS — L89323 Pressure ulcer of left buttock, stage 3: Secondary | ICD-10-CM | POA: Diagnosis not present

## 2022-11-24 DIAGNOSIS — L97521 Non-pressure chronic ulcer of other part of left foot limited to breakdown of skin: Secondary | ICD-10-CM | POA: Insufficient documentation

## 2022-11-24 NOTE — Progress Notes (Signed)
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 some degree of chronic venous inflammation but he also has recurrent cellulitis. We have been using Iodoflex 03/17/17; continued improvement in the surface and may be small changes in dimensions. Using Iodoflex which seems the only thing that will control his surface 03/24/17- He is here for follow up evaluation of his LLE lateral ulceration and ulcer to right dorsal foot/toe space. He is voicing no complaints or concerns, He is tolerating compression wrap. 03/31/17 arrives today with [name] much healthier looking wound on the left lower extremity. We have been using Iodoflex for [name] prolonged period of time which has for the first time prepared and adequate looking wound bed although we have not had much in the way of wound dimension improvement. He also has [name] small wound between the first and second toe on the right 04/07/17; arrives today with [name] healthy-looking wound bed and at least the top 50% of this wound appears to be now her. No debridement was required I have changed him to [facility] last week after prolonged Iodoflex. He did not do well with Apligraf's. We've had [name] re-opening between the first and second toe on the right 04/14/17; arrives today with [name] healthier looking wound bed contractions and the top 50% of this wound and some on the  lesser 50%. Wound bed appears healthy. The area between the first and second toe on the right still remains problematic 04/21/17; continued very gradual improvement. Using [facility] 04/28/17; continued very gradual improvement in the left lateral leg venous insufficiency wound. His periwound erythema is very mild. We have been using Hydrofera Blue. Wound is making progress especially in the superior 50% 05/05/17; he continues to have very gradual improvement in the left lateral venous insufficiency wound. Both in terms with an length rings are improving. I debrided this every 2 [name] with #5 curet and we have been using Hydrofera Blue and again making good progress With regards to the wounds between his right first and second toe which I thought might  of been tinea pedis he is not making as much progress very dry scaly skin over the area. Also the area at the base of the left first and second toe in [name] similar condition 05/12/17; continued gradual improvement in the refractory left lateral venous insufficiency wound on the left. Dimension smaller. Surface still requiring debridement using Hydrofera Blue 05/19/17; continued gradual improvement in the refractory left lateral venous ulceration. Careful inspection of the wound bed underlying rumination suggested some degree of epithelialization over the surface no debridement indicated. Continue Hydrofera Blue difficult areas between his toes first and third on the left than first and second on the right. I'm going to change to silver alginate from silver collagen. Continue ketoconazole as I suspect underlying tinea pedis 05/26/17; left lateral leg venous insufficiency wound. We've been using Hydrofera Blue. I believe that there is expanding epithelialization over the surface of the wound albeit not coming from the wound circumference. This is [name] bit of an odd situation in which the epithelialization seems to be coming from the surface of the wound rather  than in the exact circumference. There is still small open areas mostly along the lateral margin of the wound. He has unchanged areas between the left first and second and the right first second toes which I been treating for tenia pedis 06/02/17; left lateral leg venous insufficiency wound. We have been using Hydrofera Blue. Somewhat smaller from the wound circumference. The surface of the wound remains [name] bit on it almost epithelialized sedation in appearance. I use an open curette today debridement in the surface of all of this especially the edges Small open wounds remaining on the dorsal right first and second toe interspace and the plantar left first second toe and her face on the left 06/09/17; wound on the left lateral leg continues to be smaller but very gradual and very dry surface using Hydrofera Blue 06/16/17 requires weekly debridements now on the left lateral leg although this continues to contract. I changed to silver collagen last week because of dryness of the wound bed. Using Iodoflex to the areas on his first and second toes/web space bilaterally 06/24/17; patient with history of paraplegia also chronic venous insufficiency with lymphedema. Has [name] very difficult wound on the left lateral leg. This has been gradually reducing in terms of with but comes in with [name] very dry adherent surface. High switch to silver collagen [name] week or so ago with hydrogel to keep the area moist. This is been refractory to multiple dressing attempts. He also has areas in his first and second toes bilaterally in the anterior and posterior web space. I had been using Iodoflex here after [name] prolonged course of silver alginate with ketoconazole was ineffective [question tinea pedis] 07/14/17; patient arrives today with [name] very difficult adherent material over his left lateral lower leg wound. He also has surrounding erythema and poorly controlled edema. He was switched his Santyl last visit which the nurses are applying  once during his doctor visit and once on [name] nurse visit. He was also reduced to 2 layer compression I'm not exactly sure of the issue here. 07/21/17; better surface today after 1 week of Iodoflex. Significant cellulitis that we treated last week also better. [Doxycycline] 07/28/17 better surface today with now 2 [name] of Iodoflex. Significant cellulitis treated with doxycycline. He has now completed the doxycycline and he is back to his usual degree of chronic venous inflammation/stasis dermatitis. He reminds me he has had ablations surgery here 08/04/17; continued improvement  with Iodoflex to the left lateral leg wound in terms of the surface of the wound although the dimensions are better. 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 Problematic areas on the plantar aspect of the first second toe web space on the left and the dorsal aspect of the first second toe web space on the right. At one point I felt these were probably related to chronic fungal infections in treated him aggressively for this although we have not made any improvement here. 08/11/17; left lateral leg. Surface continues to improve with the Iodoflex although we are not seeing much improvement in overall wound dimensions. Areas on his plantar left foot and right foot show no improvement. In fact the right foot looks somewhat worse 08/18/17; left lateral leg. We changed to [facility] Blue last week after [name] prolonged course of Iodoflex which helps get the surface better. It appears that the wound with is improved. Continue with difficult areas on the left dorsal first second and plantar first second on the right 09/01/17; patient arrives in clinic today having had [name] temperature of 103 yesterday. He was seen in the ER and [facility]. The patient was concerned he could have cellulitis again in the right leg however they diagnosed him with [name] UTI and he is now on Keflex. He has [name]  history of cellulitis which is been recurrent and difficult but this is been in the left leg, in the past 5 use doxycycline. He does in and out catheterizations at home which are risk factors for UTI 09/08/17; patient will be completing his Keflex this weekend. The erythema on the left leg is considerably better. He has [name] new wound today on the medial part of the right leg small superficial almost looks like [name] skin tear. He has worsening of the area on the right dorsal first and second toe. His major area on the left lateral leg is better. Using Hydrofera Blue on all areas 09/15/17; gradual reduction in width on the long wound in the left lateral leg. No debridement required. He also has wounds on the plantar aspect of his left first Gigante, Youssouf [name] ([id number]) [id number].pdf Page 6 of 36 second toe web space and on the dorsal aspect of the right first second toe web space. 09/22/17; there continues to be very gradual improvements in the dimensions of the left lateral leg wound. He hasn't round erythematous spot with might be pressure on his wheelchair. There is no evidence obviously of infection no purulence no warmth He has [name] dry scaled area on the plantar aspect of the left first second toe Improved area on the dorsal right first second toe. 09/29/17; left lateral leg wound continues to improve in dimensions mostly with an is still [name] fairly long but increasingly narrow wound. He has [name] dry scaled area on the plantar aspect of his left first second toe web space Increasingly concerning area on the dorsal right first second toe. In fact I am concerned today about possible cellulitis around this wound. The areas extending up his second toe and although there is deformities here almost appears to abut on the nailbed. 10/06/17; left lateral leg wound continues to make very gradual progress. Tissue culture I did from the right first second toe dorsal foot last time grew MRSA and  enterococcus which was vancomycin sensitive. This was not sensitive to clindamycin or doxycycline. He is allergic to Zyvox and sulfa we have therefore arrange for him  to have dalvance infusion tomorrow. He is had this in the past and tolerated it well 10/20/17; left lateral leg wound continues to make decent progress. This is certainly reduced in terms of with there is advancing epithelialization.The cellulitis in the right foot looks better although he still has [name] deep wound in the dorsal aspect of the first second toe web space. Plantar left first toe web space on the left I think is making some progress 10/27/17; left lateral leg wound continues to make decent progress. Advancing epithelialization.using Hydrofera Blue The right first second toe web space wound is better-looking using silver alginate Improvement in the left plantar first second toe web space. Again using silver alginate 11/03/17 left lateral leg wound continues to make decent progress albeit slowly. Using [facility] The right per second toe web space continues to be [name] very problematic looking punched out wound. I obtained [name] piece of tissue for deep culture I did extensively treated this for fungus. It is difficult to imagine that this is [name] pressure area as the patient states other than going outside he doesn't really wear shoes at home The left plantar first second toe web space looked fairly senescent. Necrotic edges. This required debridement change to [facility] Blue to all wound areas 11/10/17; left lateral leg wound continues to contract. Using Hydrofera Blue On the right dorsal first second toe web space dorsally. Culture I did of this area last week grew MRSA there is not an easy oral option in this patient was multiple antibiotic allergies or intolerances. This was only [name] rare culture isolate I'm therefore going to use Bactroban under silver alginate On the left plantar first second toe web space. Debridement is required  here. This is also unchanged 11/17/17; left lateral leg wound continues to contract using Hydrofera Blue this is no longer the major issue. The major concern here is the right first second toe web space. He now has an open area going from dorsally to the plantar aspect. There is now wound on the inner lateral part of the first toe. Not [name] very viable surface on this. There is erythema spreading medially into the forefoot. No major change in the left first second toe plantar wound 11/24/17; left lateral leg wound continues to contract using Hydrofera Blue. Nice improvement today The right first second toe web space all of this looks [name] lot less angry than last week. I have given him clindamycin and topical Bactroban for MRSA and terbinafine for the possibility of underlining tinea pedis that I could not control with ketoconazole. Looks somewhat better The area on the plantar left first second toe web space is weeping with dried debris around the wound 12/01/17; left lateral leg wound continues to contract he Hydrofera Blue. It is becoming thinner in terms of with nevertheless it is making good improvement. The right first second toe web space looks less angry but still [name] large necrotic-looking wounds starting on the plantar aspect of the right foot extending between the toes and now extensively on the base of the right second toe. I gave him clindamycin and topical Bactroban for MRSA anterior benefiting for the possibility of underlying tinea pedis. Not looking better today The area on the left first/second toe looks better. Debrided of necrotic debris 12/05/17* the patient was worked in urgently today because over the weekend he found blood on his incontinence bad when he woke up. He was found to have an ulcer by his wife who does most of his wound care.  He came in today for [place] to look at this. He has not had [name] history of wounds in his buttocks in spite of his paraplegia. 12/08/17; seen in follow-up today  at his usual appointment. He was seen earlier this week and found to have [name] new wound on his buttock. We also follow him for wounds on the left lateral leg, left first second toe web space and right first second toe web space 12/15/17; we have been using Hydrofera Blue to the left lateral leg which has improved. The right first second toe web space has also improved. Left first second toe web space plantar aspect looks stable. The left buttock has worsened using Santyl. Apparently the buttock has drainage 12/22/17; we have been using Hydrofera Blue to the left lateral leg which continues to improve now 2 small wounds separated by normal skin. He tells [place] he had [name] fever up to 100 yesterday he is prone to UTIs but has not noted anything different. He does in and out catheterizations. The area between the first and second toes today does not look good necrotic surface covered with what looks to be purulent drainage and erythema extending into the third toe. I had gotten this to something that I thought look better last time however it is not look good today. He also has [name] necrotic surface over the buttock wound which is expanded. I thought there might be infection under here so I removed [name] lot of the surface with [name] #5 curet though nothing look like it really needed culturing. He is been using Santyl to this area 12/27/17; his original wound on the left lateral leg continues to improve using Hydrofera Blue. I gave him samples of Baxdella although he was unable to take them out of fear for an allergic reaction ["lump in his throat"].the culture I did of the purulent drainage from his second toe last week showed both enterococcus and [name] set Enterobacter I was also concerned about the erythema on the bottom of his foot although paradoxically although this looks somewhat better today. Finally his pressure ulcer on the left buttock looks worse this is clearly now [name] stage III wound necrotic surface requiring  debridement. We've been using silver alginate here. They came up today that he sleeps in [name] recliner, I'm not sure why but I asked him to stop this 01/03/18; his original wound we've been using Hydrofera Blue is now separated into 2 areas. Ulcer on his left buttock is better he is off the recliner and sleeping in bed Finally both wound areas between his first and second toes also looks some better 01/10/18; his original wound on the left lateral leg is now separated into 2 wounds we've been using Hydrofera Blue Ulcer on his left buttock has some drainage. There is [name] small probing site going into muscle layer superiorly.using silver alginate -He arrives today with [name] deep tissue injury on the left heel The wound on the dorsal aspect of his first second toe on the left looks [name] lot betterusing silver alginate ketoconazole The area on the first second toe web space on the right also looks [name] lot bette 01/17/18; his original wound on the left lateral leg continues to progress using Hydrofera Blue Ulcer on his left buttock also is smaller surface healthier except for [name] small probing site going into the muscle layer superiorly. 2.4 cm of tunneling in this area DTI on his left heel we have only been offloading. Looks better than last week no  threatened open no evidence of infection the wound on the dorsal aspect of the first second toe on the left continues to look like it's regressing we have only been using silver alginate and terbinafine orally The area in the first second toe web space on the right also looks to be [name] lot better using silver alginate and terbinafine I think this was prompted by tinea pedis 01/31/18; the patient was hospitalized in [place] last week apparently for [name] complicated UTI. He was discharged on cefepime he does in and out catheterizations. In the hospital he was discovered M I don't mild elevation of AST and ALT and the terbinafine was stopped.predictably the pressure ulcer  on s his buttock looks betterusing silver alginate. The area on the left lateral leg also is better using Hydrofera Blue. The area between the first and second toes on the left better. First and second toes on the right still substantial but better. Finally the DTI on the left heel has held together and looks like it's resolving 02/07/18-he is here in follow-up evaluation for multiple ulcerations. He has new injury to the lateral aspect of the last issue [name] pressure ulcer, he states this is from adhesive removal trauma. He states he has tried multiple adhesive products with no success. All other ulcers appear stable. The left heel DTI is resolving. We will continue with same treatment plan and follow-up next week. 02/14/18; follow-up for multiple areas. He has [name] new area last week on the lateral aspect of his pressure ulcer more over the posterior trochanter. The original pressure ulcer looks quite stable has healthy granulation. 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 Almost fully epithelialized on the original superior area using Hydrofera Blue DTI on the left heel has peeled off this week to reveal [name] small superficial wound under denuded skin and subcutaneous tissue Both areas between the first and second toes look better including nothing open on the left 02/21/18; The patient's wounds on his left ischial tuberosity and posterior left greater trochanter actually looked better. He has [name] large area of irritation around the area which I think is contact dermatitis. I am doubtful that this is fungal His original wound on the left lateral calf continues to improve we have been using Hydrofera Blue There is no open area in the left first second toe web space although there is [name] lot of thick callus The DTI on the left  heel required debridement today of necrotic surface eschar and subcutaneous tissue using silver alginate Finally the area on the right first second toe webspace continues to contract using silver alginate and ketoconazole 02/28/18 Left ischial tuberosity wounds look better using silver alginate. Original wound on the left calf only has one small open area left using Hydrofera Blue DTI on the left heel required debridement mostly removing skin from around this wound surface. Using silver alginate The areas on the right first/second toe web space using silver alginate and ketoconazole 03/08/18 on evaluation today patient appears to be doing decently well as best I can tell in regard to his wounds. This is the first time that I have seen him as he generally is followed by Dr. Dellia Nims. With that being said none of his wounds appear to be infected he does have an area where there is some skin covering what appears to be [name] new wound  on the left dorsal surface of his great toe. This is right at the nail bed. With that being said I do believe that debrided away some of the excess skin can be of benefit in this regard. Otherwise he has been tolerating the dressing changes without complication. 03/14/18; patient arrives today with the multiplicity of wounds that we are following. He has not been systemically unwell Original wound on the left lateral calf now only has 2 small open areas we've been using Hydrofera Blue which should continue The deep tissue injury on the left heel requires debridement today. We've been using silver alginate The left first second toe and the right first second toe are both are reminiscence what I think was tinea pedis. Apparently some of the callus Surface between the toes was removed last week when it started draining. Purulent drainage coming from the wound on the ischial tuberosity on the left. 03/21/18-He is here in follow-up evaluation for multiple wounds. There is improvement, he  is currently taking doxycycline, culture obtained last week grew tetracycline sensitive MRSA. He tolerated debridement. The only change to last week's recommendations is to discontinue antifungal cream between toes. He will follow-up next week 03/28/18; following up for multiple wounds;Concern this week is streaking redness and swelling in the right foot. He is going to need antibiotics for this. 03/31/18; follow-up for right foot cellulitis. Streaking redness and swelling in the right foot on 03/28/18. 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 I put him on clindamycin 300 mg every 6 and brought him in for [name] quick check. He has an open wound between his first and second toes on the right foot as [name] potential source. 04/04/18; Right foot cellulitis is resolving he is completing clindamycin. This is truly good news Left lateral calf wound which is initial wound only has one small open area inferiorly this is close to healing out. He has compression stockings. We will use Hydrofera Blue right down to the epithelialization of this Nonviable surface on the left heel which was initially pressure with [name] DTI. We've been using Hydrofera Blue. I'm going to switch this back to silver alginate Left first second toe/tinea pedis this looks better using silver alginate Right first second toe tinea pedis using silver alginate Large pressure ulcers on theLeft ischial tuberosity. Small wound here Looks better. I am uncertain about the surface over the large wound. Using silver alginate 04/11/18; Cellulitis in the right foot is resolved Left lateral calf wound which was his original wounds still has 2 tiny open areas remaining this is just about closed Nonviable surface on the left heel is better but still requires debridement Left first second toe/tinea pedis still open using silver alginate Right first second toe wound tinea pedis I asked him to go back to using ketoconazole and silver  alginate Large pressure ulcers on the left ischial tuberosity this shear injury here is resolved. Wound is smaller. No evidence of infection using silver alginate 04/18/18; Patient arrives with an intense area of cellulitis in the right mid lower calf extending into the right heel area. Bright red and warm. Smaller area on the left anterior leg. He has [name] significant history of MRSA. He will definitely need antibioticsdoxycycline He now has 2 open areas on the left ischial tuberosity the original large wound and now [name] satellite area which I think was above his initial satellite areas. Not [name] wonderful surface on this satellite area surrounding erythema which looks like pressure related. His left  lateral calf wound again his original wound is just about closed Left heel pressure injury still requiring debridement Left first second toe looks [name] lot better using silver alginate Right first second toe also using silver alginate and ketoconazole cream also looks better 04/20/18; the patient was worked in early today out of concerns with his cellulitis on the right leg. I had started him on doxycycline. This was 2 days ago. His wife was concerned about the swelling in the area. Also concerned about the left buttock. He has not been systemically unwell no fever chills. No nausea vomiting or diarrhea 04/25/18; the patient's left buttock wound is continued to deteriorate he is using Hydrofera Blue. He is still completing clindamycin for the cellulitis on the right leg although all of this looks better. 05/02/18 Left buttock wound still with [name] lot of drainage and [name] very tightly adherent fibrinous necrotic surface. He has [name] deeper area superiorly The left lateral calf wound is still closed DTI wound on the left heel necrotic surface especially the circumference using Iodoflex Areas between his left first second toe and right first second toe both look better. Dorsally and the right first second toe he had [name]  necrotic surface although at smaller. In using silver alginate and ketoconazole. I did [name] culture last week which was [name] deep tissue culture of the reminiscence of the open wound on the right first second toe dorsally. This grew [name] few Acinetobacter and [name] few methicillin-resistant staph aureus. Nevertheless the area actually this week looked better. I didn't feel the need to specifically address this at least in terms of systemic antibiotics. 05/09/18; wounds are measuring larger more drainage per our intake. We are using Santyl covered with alginate on the large superficial buttock wounds, Iodosorb on the left heel, ketoconazole and silver alginate to the dorsal first and second toes bilaterally. 05/16/18; The area on his left buttock better in some aspects although the area superiorly over the ischial tuberosity required an extensive debridement.using Santyl Left heel appears stable. Using Iodoflex The areas between his first and second toes are not bad however there is spreading erythema up the dorsal aspect of his left foot this looks like cellulitis again. He is insensate the erythema is really very brilliant.o Erysipelas He went to see an allergist days ago because he was itching part of this he had lab work done. This showed [name] white count of 15.1 with 70% neutrophils. Hemoglobin of 11.4 and [name] platelet count of 659,000. Last white count we had in [facility] was [name] 2-1/2 years ago which was 25.9 but he was ill at the time. He was able to show me some lab work that was done by his primary physician the pattern is about the same. 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 But prompted me to go ahead and do x-rays of both feet and the pelvis rule out osteomyelitis. He also had [name] comprehensive metabolic panel this was reasonably normal his albumin was 3.7 liver function tests BUN/creatinine all normal 05/23/18; x-rays of both his feet from last week were negative for  underlying pulmonary abnormality. The x-ray of his pelvis however showed mild irregularity in the left ischial which may represent some early osteomyelitis. The wound in the left ischial continues to get deeper clearly now exposed muscle. Each week necrotic surface material over this area. Whereas the rest of the wounds do not look so bad. The left ischial wound we have been  using Santyl and calcium alginate T the left heel surface necrotic debris using Iodoflex o Trivett, Daquavion [name] ([id number]) [id number] Page 8 of 36 The left lateral leg is still healed Areas on the left dorsal foot and the right dorsal foot are about the same. There is some inflammation on the left which might represent contact dermatitis, fungal dermatitis I am doubtful cellulitis although this looks better than last week 05/30/18; CT scan done at Hospital did not show any osteomyelitis or abscess. Suggested the possibility of underlying cellulitis although I don't see [name] lot of evidence of this at the bedside The wound itself on the left buttock/upper thigh actually looks somewhat better. No debridement Left heel also looks better no debridement continue Iodoflex Both dorsal first second toe spaces appear better using Lotrisone. Left still required debridement 06/06/18; Intake reported some purulent looking drainage from the left gluteal wound. Using Santyl and calcium alginate Left heel looks better although still [name] nonviable surface requiring debridement The left dorsal foot first/second webspace actually expanding and somewhat deeper. I may consider doing [name] shave biopsy of this area Right dorsal foot first/second webspace appears stable to improved. Using Lotrisone and silver alginate to both these areas 06/13/18 Left gluteal surface looks better. Now separated in the 2 wounds. No debridement required. Still drainage. We'll continue silver alginate Left heel continues to look better with Iodoflex  continue this for at least another week Of his dorsal foot wounds the area on the left still has some depth although it looks better than last week. We've been using Lotrisone and silver alginate 06/20/18 Left gluteal continues to look better healthy tissue Left heel continues to look better healthy granulation wound is smaller. He is using Iodoflex and his long as this continues continue the Iodoflex Dorsal right foot looks better unfortunately dorsal left foot does not. There is swelling and erythema of his forefoot. He had minor trauma to this several days ago but doesn't think this was enough to have caused any tissue injury. Foot looks like cellulitis, we have had this problem before 06/27/18 on evaluation today patient appears to be doing [name] little worse in regard to his foot ulcer. 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 Both of which would really be his only options for treating this infection. In the past he has been given and effusion of Orbactiv. This is done very well for him in the past again it's one time dosing IV antibiotic therapy. Subsequently I do believe this is something we're gonna need to see about doing at this point in time. Currently his other wounds seem to be doing somewhat better in my pinion I'm pretty happy in that regard. 07/03/18 on evaluation today patient's wounds actually appear to be doing fairly well. He has been tolerating the dressing changes without complication. All in all he seems to be showing signs of improvement. In regard to the antibiotics he has been dealing with infectious disease since I saw him last week as far as getting this scheduled. In the end [facility]'s [facility] this coming Friday. In the meantime he has been continuing to perform the dressing changes  in such as previous. There does not appear to be any evidence of infection worsengin at this time. 07/10/18; Since I last saw this man  2 [name] ago things have actually improved. IV antibiotics of resulted in less forefoot erythema although there is still some present. He is not systemically unwell Left buttock wounds 2 now have no depth there is increased epithelialization Using silver alginate Left heel still requires debridement using Iodoflex Left dorsal foot still with [name] sizable wound about the size of [name] border but healthy granulation Right dorsal foot still with [name] slitlike area using silver alginate 07/18/18; the patient's cellulitis in the left foot is improved in fact I think it is on its way to resolving. Left buttock wounds 2 both look better although the larger one has hypertension granulation we've been using silver alginate Left heel has some thick circumferential redundant skin over the wound edge which will need to be removed today we've been using Iodoflex Left dorsal foot is still [name] sizable wound required debridement using silver alginate The right dorsal foot is just about closed only [name] small open area remains here 07/25/18; left foot cellulitis is resolved Left buttock wounds 2 both look better. Hyper-granulation on the major area Left heel as some debris over the surface but otherwise looks [name] healthier wound. Using silver collagen Right dorsal foot is just about closed 07/31/18; arrives with our intake nurse worried about purulent drainage from the buttock. We had hyper-granulation here last week His buttock wounds 2 continue to look better Left heel some debris over the surface but measuring smaller. Right dorsal foot unfortunately has openings between the toes Left foot superficial wound looks less aggravated. 08/07/18 Buttock wounds continue to look better although some of her granulation and the larger medial wound. silver alginate Left heel continues to look [name] lot  better.silver collagen Left foot superficial wound looks less stable. Requires debridement. He has [name] new wound superficial area on the foot on the lateral dorsal foot. Right foot looks better using silver alginate without Lotrisone 08/14/2018; patient was in the ER last week diagnosed with [name] UTI. He is now on Cefpodoxime and Macrodantin. Buttock wounds continued to be smaller. Using silver alginate Left heel continues to look better using silver collagen Left foot superficial wound looks as though it is improving Right dorsal foot area is just about healed. 08/21/2018; patient is completed his antibiotics for his UTI. He has 2 open areas on the buttocks. There is still not closed although the surface looks satisfactory. Using silver alginate Left heel continues to improve using silver collagen The bilateral dorsal foot areas which are at the base of his first and second toes/possible tinea pedis are actually stable on the left but worse on the right. The area on the left required debridement of necrotic surface. After debridement I obtained [name] specimen for PCR culture. The right dorsal foot which is been just about healed last week is now reopened 08/28/2018; culture done on the left dorsal foot showed coag negative staph both staph epidermidis and Lugdunensis. I think this is worthwhile initiating systemic treatment. I will use doxycycline given his long list of allergies. The area on the left heel slightly improved but still requiring debridement. The large wound on the buttock is just about closed whereas the smaller one is larger. Using silver alginate in this area 09/04/2018; patient is completing his doxycycline for the left foot although this continues to be [name] very difficult wound area with very adherent necrotic debris. We are using silver alginate to all his wounds right foot left foot and the small wounds on his buttock, silver collagen on the left heel.  09/11/2018; once again this patient  has intense erythema and swelling of the left forefoot. Lesser degrees of erythema in the right foot. He has [name] long list of allergies and intolerances. I will reinstitute doxycycline. 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 Using silver alginate Left heel also looks better using silver collagen Unfortunately both the areas on his feet look worse. The area on the left first second webspace is now gone through to the plantar part of his foot. The area on the left foot anteriorly is irritated with erythema and swelling in the forefoot. 09/25/2018 His wound on the left plantar heel looks better. Using silver collagen The area on the left buttock 2 small remnant areas. One is closed one is still open. Using silver alginate The areas between both his first and second toes look worse. This in spite of long-standing antifungal therapy with ketoconazole and silver alginate which should have antifungal activity He has small areas around his original wound on the left calf one is on the bottom of the original scar tissue and one superiorly both of these are small and superficial but again given wound history in this site this is worrisome 10/02/2018 Ihrig, Jahi [name] ([id number]) [id number] Page 9 of 36 Left plantar heel continues to gradually contract using silver collagen Left buttock wound is unchanged using silver alginate The areas on his dorsal feet between his first and second toes bilaterally look about the same. I prescribed clindamycin ointment to see if we can address chronic staph colonization and also the underlying possibility of erythrasma The left lateral lower extremity wound is actually on the lateral part of his ankle. Small open area here. We have been using silver alginate 10/09/2018; Left plantar heel continues to look healthy and contract. No debridement is required Left buttock slightly smaller with [name] tape  injury wound just below which was new this week Dorsal feet somewhat improved I have been using clindamycin Left lateral looks lower extremity the actual open area looks worse although [name] lot of this is epithelialized. I am going to change to silver collagen today He has [name] lot more swelling in the right leg although this is not pitting not red and not particularly warm there is [name] lot of spasm in the right leg usually indicative of people with paralysis of some underlying discomfort. We have reviewed his vascular status from 2017 he had [name] left greater saphenous vein ablation. I wonder about referring him back to vascular surgery if the area on the left leg continues to deteriorate. 10/16/2018 in today for follow-up and management of multiple lower extremity ulcers. His left Buttock wound is much lower smaller and almost closed completely. The wound to the left ankle has began to reopen with Epithelialization and some adherent slough. He has multiple new areas to the left foot and leg. The left dorsal foot without much improvement. Wound present between left great webspace and 2nd toe. Erythema and edema present right leg. Right [name] ultrasound obtained on 10/10/18 was negative for DVT . 10/23/2018; Left buttock is closed over. Still dry macerated skin but there is no open wound. I suspect this is chronic pressure/moisture Left lateral calf is quite [name] bit worse than when I saw this last. There is clearly drainage here he has macerated skin into the left plantar heel. We will change the primary dressing to alginate Left dorsal foot has some improvement in overall wound area. Still using  clindamycin and silver alginate Right dorsal foot about the same as the left using clindamycin and silver alginate The erythema in the right leg has resolved. He is DVT rule out was negative Left heel pressure area required debridement although the wound is smaller and the surface is health 10/26/2018 The patient came  back in for his nurse check today predominantly because of the drainage coming out of the left lateral leg with [name] recent reopening of his original wound on the left lateral calf. He comes in today with [name] large amount of surrounding erythema around the wound extending from the calf into the ankle and even in the area on the dorsal foot. He is not systemically unwell. He is not febrile. Nevertheless this looks like cellulitis. We have been using silver alginate to the area. I changed him to [name] regular visit and I am going to prescribe him doxycycline. The rationale here is [name] long list of medication intolerances and [name] history of MRSA. I did not see anything that I thought would provide [name] valuable culture 10/30/2018 Follow-up from his appointment 4 days ago with really an extensive area of cellulitis in the left calf left lateral ankle and left dorsal foot. I put him on doxycycline. He has [name] long list of medication allergies which are true allergy reactions. 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 In any case he is [name] lot better today. The erythema especially in the anterior and lateral left calf is better. He still has left ankle erythema. He also is complaining about increasing edema in the right leg we have only been using Kerlix Coban and he has been doing the wraps at home. Finally he has [name] spotty rash on the medial part of his upper left calf which looks like folliculitis or perhaps wrap occlusion type injury. Small superficial macules not pustules 11/06/18 patient arrives today with again [name] considerable degree of erythema around the wound on the left lateral calf extending into the dorsal ankle and dorsal foot. This is [name] lot worse than when I saw this last week. He is on doxycycline really with not [name] lot of improvement. He has not been systemically unwell Wounds on the; left heel actually looks improved. Original area on the left  foot and proximity to the first and second toes looks about the same. He has superficial areas on the dorsal foot, anterior calf and then the reopening of his original wound on the left lateral calf which looks about the same The only area he has on the right is the dorsal webspace first and second which is smaller. He has [name] large area of dry erythematous skin on the left buttock small open area here. 11/13/2018; the patient arrives in much better condition. The erythema around the wound on the left lateral calf is [name] lot better. Not sure whether this was the clindamycin or the TCA and ketoconazole or just in the improvement in edema control [stasis dermatitis]. In any case this is [name] lot better. The area on the left heel is very small and just about resolved using silver collagen we have been using silver alginate to the areas on his dorsal feet 11/20/2018; his wounds include the left lateral calf, left heel, dorsal aspects of both feet just proximal to the first second webspace. He is stable to slightly improved. I did not think any changes to his dressings were going to be necessary 11/27/2018 he has  [name] reopening on the left buttock which is surrounded by what looks like tinea or perhaps some other form of dermatitis. The area on the left dorsal foot has some erythema around it I have marked this area but I am not sure whether this is cellulitis or not. Left heel is not closed. Left calf the reopening is really slightly longer and probably worse 1/13; in general things look better and smaller except for the left dorsal foot. Area on the left heel is just about closed, left buttock looks better only [name] small wound remains in the skin looks better [using Lotrisone] 1/20; the area on the left heel only has [name] few remaining open areas here. Left lateral calf about the same in terms of size, left dorsal foot slightly larger right lateral foot still not closed. The area on the left buttock has no open wound and  the surrounding skin looks [name] lot better 1/27; the area on the left heel is closed. Left lateral calf better but still requiring extensive debridements. The area on his left buttock is closed. He still has the open areas on the left dorsal foot which is slightly smaller in the right foot which is slightly expanded. We have been using Iodoflex on these areas as well 2/3; left heel is closed. Left lateral calf still requiring debridement using Iodoflex there is no open area on his left buttock however he has dry scaly skin over [name] large area of this. Not really responding well to the Lotrisone. Finally the areas on his dorsal feet at the level of the first second webspace are slightly smaller on the right and about the same on the left. Both of these vigorously debrided with Anasept and gauze 2/10; left heel remains closed he has dry erythematous skin over the left buttock but there is no open wound here. Left lateral leg has come in and with. Still requiring debridement we have been using Iodoflex here. Finally the area on the left dorsal foot and right dorsal foot are really about the same extremely dry callused fissured areas. He does not yet have [name] dermatology appointment 2/17; left heel remains closed. He has [name] new open area on the left buttock. The area on the left lateral calf is bigger longer and still covered in necrotic debris. No major change in his foot areas bilaterally. I am awaiting for [name] dermatologist to look on this. We have been using ketoconazole I do not know that this is been doing any good at all. 2/24; left heel remains closed. The left buttock wound that was new reopening last week looks better. The left lateral calf appears better also although still requires debridement. The major area on his foot is the left first second also requiring debridement. We have been putting Prisma on all wounds. I do not believe that the ketoconazole has done too much good for his feet. He will use  Lotrisone I am going to give him [name] 2-week course of terbinafine. We still do not have [name] dermatology appointment 3/2 left heel remains closed however there is skin over bone in this area I pointed this out to him today. The left buttock wound is epithelialized but still does not look completely stable. The area on the left leg required debridement were using silver collagen here. With regards to his feet we changed to Lotrisone last week and silver alginate. 3/9; left heel remains closed. Left buttock remains closed. The area on the right foot is essentially closed. The  left foot remains unchanged. Slightly smaller on the left lateral calf. Using silver collagen to both of these areas 3/16-Left heel remains closed. Area on right foot is closed. Left lateral calf above the lateral malleolus open wound requiring debridement with easy bleeding. Left dorsal wound proximal to first toe also debrided. Left ischial area open new. Patient has been using Prisma with wrapping every 3 days. Dermatology appointment is apparently tomorrow.Patient has completed his terbinafine 2-week course with some apparent improvement according to him, there is still flaking and dry skin in his foot on the left 3/23; area on the right foot is reopened. The area on the left anterior foot is about the same still [name] very necrotic adherent surface. He still has the area on the left leg and reopening is on the left buttock. He apparently saw dermatology although I do not have [name] note. According to the patient who is usually fairly well informed they did not have any good ideas. Put him on oral terbinafine which she is been on before. 3/30; using silver collagen to all wounds. Apparently his dermatologist put him on doxycycline and rifampin presumably some culture grew staph. I do not have Murrell, Wren ([id number]) [id number].pdf Page 10 of 36 this result. He remains on terbinafine although I have used  terbinafine on him before 4/6; patient has had [name] fairly substantial reopening on the right foot between the first and second toes. He is finished his terbinafine and I believe is on doxycycline and rifampin still as prescribed by dermatology. We have been using silver collagen to all his wounds although the patient reports that he thinks silver alginate does better on the wounds on his buttock. 4/13; the area on his left lateral calf about the same size but it did not require debridement. Left dorsal foot just proximal to the webspace between the first and second toes is about the same. Still nonviable surface. I note some superficial bronze discoloration of the dorsal part of his foot Right dorsal foot just proximal to the first and second toes also looks about the same. I still think there may be the same discoloration I noted above on the left Left buttock wound looks about the same 4/20; left lateral calf appears to be gradually contracting using silver collagen. 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 The left dorsal foot wound is debrided of tightly adherent necrotic debris and really cleans up quite nicely. The right area is worse with expansion. I did not debride this it is now over the base of the second toe The area on his left buttock is smaller no debridement is required using silver collagen 5/4; left calf continues to make good progress. He arrives with erythema around the wounds on his dorsal foot which even extends to the plantar aspect. Very concerning for coexistent infection. He is finished the erythromycin I gave him for possible erythrasma this does not seem to have helped. The area on the left foot is about the same base of the dorsal toes Is area on the buttock looks improved on the left 5/11; left calf and left buttock continued to make good progress. Left foot is about the same to slightly improved. Major problem  is on the right foot. He has not had an x-ray. Deep tissue culture I did last week showed both Enterobacter and [name]. coli. I did not change the doxycycline I put him on empirically although neither 1 of these were plated to  doxycycline. He arrives today with the erythema looking worse on both the dorsal and plantar foot. Macerated skin on the bottom of the foot. he has not been systemically unwell 5/18-Patient returns at 1 week, left calf wound appears to be making some progress, left buttock wound appears slightly worse than last time, left foot wound looks slightly better, right foot redness is marginally better. X-ray of both feet show no air or evidence of osteomyelitis. Patient is finished his Omnicef and terbinafine. He continues to have macerated skin on the bottom of the left foot as well as right 5/26; left calf wound is better, left buttock wound appears to have multiple small superficial open areas with surrounding macerated skin. X-rays that I did last time showed no evidence of osteomyelitis in either foot. He is finished cefdinir and doxycycline. I do not think that he was on terbinafine. He continues to have [name] large superficial open area on the right foot anterior dorsal and slightly between the first and second toes. I did send him to dermatology 2 months ago or so wondering about whether they would do [name] fungal scraping. I do not believe they did but did do [name] culture. We have been using silver alginate to the toe areas, he has been using antifungals at home topically either ketoconazole or Lotrisone. We are using silver collagen on the left foot, silver alginate on the right, silver collagen on the left lateral leg and silver alginate on the left buttock 6/1; left buttock area is healed. We have the left dorsal foot, left lateral leg and right dorsal foot. We are using silver alginate to the areas on both feet and silver collagen to the area on his left lateral calf 6/8; the left buttock  apparently reopened late last week. He is not really sure how this happened. He is tolerating the terbinafine. Using silver alginate to all wounds 6/15; left buttock wound is larger than last week but still superficial. Came in the clinic today with [name] report of purulence from the left lateral leg I did not identify any infection Both areas on his dorsal feet appear to be better. He is tolerating the terbinafine. Using silver alginate to all wounds 6/22; left buttock is about the same this week, left calf quite [name] bit better. His left foot is about the same however he comes in with erythema and warmth in the right forefoot once again. Culture that I gave him in the beginning of May showed Enterobacter and [name]. coli. I gave him doxycycline and things seem to improve although neither 1 of these organisms was specifically plated. 6/29; left buttock is larger and dry this week. Left lateral calf looks to me to be improved. Left dorsal foot also somewhat improved right foot completely unchanged. The erythema on the right foot is still present. He is completing the Ceftin dinner that I gave him empirically [see discussion above.) 7/6 - All wounds look to be stable and perhaps improved, the left buttock wound is slightly smaller, per patient bleeds easily, completed ceftin, the right foot redness is less, he is on terbinafine 7/13; left buttock wound about the same perhaps slightly narrower. Area on the left lateral leg continues to narrow. Left dorsal foot slightly smaller right foot about the same. We are using silver alginate on the right foot and Hydrofera Blue to the areas on the left. Unna boot on the left 2 layer compression on the right 7/20; left buttock wound absolutely the same. Area on lateral  leg continues to get better. Left dorsal foot require debridement as did the right no major change in the 7/27; left buttock wound the same size necrotic debris over the surface. The area on the lateral leg  is closed once again. His left foot looks better right foot about the same although there is some involvement now of the posterior first second toe area. He is still on terbinafine which I have given him for [name] month, not certain [name] centimeter major change 06/25/19-All wounds appear to be slightly improved according to report, left buttock wound looks clean, both foot wounds have minimal to no debris the right dorsal foot has minimal slough. We are using Hydrofera Blue to the left and silver alginate to the right foot and ischial wound. 8/10-Wounds all appear to be around the same, the right forefoot distal part has some redness which was not there before, however the wound looks clean and small. Ischial wound looks about the same with no changes 8/17; his wound on the left lateral calf which was his original chronic venous insufficiency wound remains closed. Since I last saw him the areas on the left dorsal foot right dorsal foot generally appear better but require debridement. The area on his left initial tuberosity appears somewhat larger to me perhaps hyper granulated and bleeds very easily. We have been using Hydrofera Blue to the left dorsal foot and silver alginate to everything else 8/24; left lateral calf remains closed. The areas on his dorsal feet on the webspace of the first and second toes bilaterally both look better. The area on the left buttock which is the pressure ulcer stage II slightly smaller. I change the dressing to Hydrofera Blue to all areas 8/31; left lateral calf remains closed. The area on his dorsal feet bilaterally look better. Using Hydrofera Blue. Still requiring debridement on the left foot. No change in the left buttock pressure ulcers however 9/14; left lateral calf remains closed. Dorsal feet look quite [name] bit better than 2 [name] ago. Flaking dry skin also [name] lot better with the ammonium lactate I gave him 2 [name] ago. The area on the left buttock is improved. He states  that his Roho cushion developed [name] leak and he is getting [name] new one, in the interim he is offloading this vigorously 9/21; left calf remains closed. Left heel which was [name] possible DTI looks better this week. He had macerated tissue around the left dorsal foot right foot looks satisfactory and improved left buttock wound. I changed his dressings to his feet to silver alginate bilaterally. Continuing Hydrofera Blue on the left buttock. 9/28 left calf remains closed. Left heel did not develop anything [possible DTI] dry flaking skin on the left dorsal foot. Right foot looks satisfactory. Improved left buttock wound. We are using silver alginate on his feet Hydrofera Blue on the buttock. I have asked him to go back to the Lotrisone on his feet including the wounds and surrounding areas 10/5; left calf remains closed. The areas on the left and right feet about the same. [name] lot of this is epithelialized however debris over the remaining open areas. He is using Lotrisone and silver alginate. The area on the left buttock using Hydrofera Blue 10/26. Patient has been out for 3 [name] secondary to Covid concerns. He tested negative but I think his wife tested positive. He comes in today with the left foot substantially worse, right foot about the same. Even more concerning he states that the area on his  left buttock closed over but then reopened and is considerably deeper in one aspect than it was before [stage III wound] 11/2; left foot really about the same as last week. Quarter sized wound on the dorsal foot just proximal to the first second toes. Surrounding erythema with areas of denuded epithelium. This is not really much different looking. Did not look like cellulitis this time however. Right foot area about the same.. We have been using silver alginate alginate on his toes Left buttock still substantial irritated skin around the wound which I think looks somewhat better. We have been using Hydrofera Blue  here. 11/9; left foot larger than last week and [name] very necrotic surface. Right foot I think is about the same perhaps slightly smaller. Debris around the circumference also addressed. Unfortunately on the left buttock there is been [name] decline. Satellite lesions below the major wound distally and now [name] an additional one posteriorly we have been using Hydrofera Blue but I think this is [name] pressure issue 11/16; left foot ulcer dorsally again [name] very adherent necrotic surface. Right foot is about the same. Not much change in the pressure ulcer on his left buttock. 11/30; left foot ulcer dorsally basically the same as when I saw him 2 [name] ago. Very adherent fibrinous debris on the wound surface. Patient reports [name] lot of drainage as well. 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 We have been using Iodoflex, patient changed Debell, Altair [name] ([id number]) [id number] Page 11 of 36 back to alginate because of the drainage. Area on his right dorsal foot really looks benign with [name] healthier surface certainly [name] lot better than on the left. Left buttock wounds all improved using Hydrofera Blue 12/7; left dorsal foot again no improvement. Tightly adherent debris. PCR culture I did last week only showed likely skin contaminant. I have gone ahead and done [name] punch biopsy of this which is about the last thing in terms of investigations I can think to do. He has known venous insufficiency and venous hypertension and this could be the issue here. The area on the right foot is about the same left buttock slightly worse according to our intake nurse secondary to [facility] Blue sticking to the wound 12/14; biopsy of the left foot that I did last time showed changes that could be related to wound healing/chronic stasis dermatitis phenomenon no neoplasm. We have been using silver alginate to both feet. I change the one on the left today to Sorbact and  silver alginate to his other 2 wounds 12/28; the patient arrives with the following problems; Major issue is the dorsal left foot which continues to be [name] larger deeper wound area. Still with [name] completely nonviable surface Paradoxically the area mirror image on the right on the right dorsal foot appears to be getting better. He had some loss of dry denuded skin from the lower part of his original wound on the left lateral calf. Some of this area looked [name] little vulnerable and for this reason we put him in wrap that on this side this week The area on his left buttock is larger. He still has the erythematous circular area which I think is [name] combination of pressure, sweat. This does not look like cellulitis or fungal dermatitis 11/26/2019; -Dorsal left foot large open wound with depth. Still debris over the surface. Using Sorbact The area on the dorsal right foot paradoxically has closed over He has [name] reopening on the left  ankle laterally at the base of his original wound that extended up into the calf. This appears clean. The left buttock wound is smaller but with very adherent necrotic debris over the surface. We have been using silver alginate here as well The patient had arterial studies done in 2017. He had biphasic waveforms at the dorsalis pedis and posterior tibial bilaterally. ABI in the left was 1.17. Digit waveforms were dampened. He has slight spasticity in the great toes I do not think [name] TBI would be possible 1/11; the patient comes in today with [name] sizable reopening between the first and second toes on the right. This is not exactly in the same location where we have been treating wounds previously. According to our intake nurse this was actually fairly deep but 0.6 cm. The area on the left dorsal foot looks about the same the surface is somewhat cleaner using Sorbact, his MRI is in 2 days. We have not managed yet to get arterial studies. The new reopening on the left lateral calf looks  somewhat better using alginate. The left buttock wound is about the same using alginate 1/18; the patient had his ARTERIAL studies which were quite normal. ABI in the right at 1.13 with triphasic/biphasic waveforms on the left ABI 1.06 again with triphasic/biphasic waveforms. It would not have been possible to have done [name] toe brachial index because of spasticity. We have been using Sorbac to the left foot alginate to the rest of his wounds on the right foot left lateral calf and left buttock 1/25; arrives in clinic with erythema and swelling of the left forefoot worse over the first MTP area. This extends laterally dorsally and but also posteriorly. Still has an area on the left lateral part of the lower part of his calf wound it is eschared and clearly not closed. Area on the left buttock still with surrounding irritation and erythema. Right foot surface wound dorsally. The area between the right and first and second toes appears better. 2/1; The left foot wound is about the same. Erythema slightly better I gave him [name] week of doxycycline empirically Right foot wound is more extensive extending between the toes to the plantar surface Left lateral calf really no open surface on the inferior part of his original wound however the entire area still looks vulnerable Absolutely no improvement in the left buttock wound required debridement. 2/8; the left foot is about the same. Erythema is slightly improved I gave him clindamycin last week. Right foot looks better he is using Lotrimin and silver alginate He has [name] breakdown in the left lateral calf. Denuded epithelium which I have removed Left buttock about the same were using Hydrofera Blue 2/15; left foot is about the same there is less surrounding erythema. Surface still has tightly adherent debris which I have debriding however not making any progress Right foot has [name] substantial wound on the medial right second toe between the first and second  webspace. Still an open area on the left lateral calf distal area. Buttock wound is about the same 2/22; left foot is about the same less surrounding erythema. Surface has adherent debris. Polymen Ag Right foot area significant wound between the first and second toes. We have been using silver alginate here Left lateral leg polymen Ag at the base of his original venous insufficiency wound Left buttock some improvement here 3/1; Right foot is deteriorating in the first second toe webspace. Larger and more substantial. We have been using silver alginate. Left dorsal  foot about the same markedly adherent surface debris using PolyMem Ag Left lateral calf surface debris using PolyMem AG Left buttock is improved again using PolyMem Ag. He is completing his terbinafine. The erythema in the foot seems better. He has been on this for 2 [name] 3/8; no improvement in any wound area in fact he has [name] small open area on the dorsal midfoot which is new this week. He has not gotten his foot x-rays yet 3/15; his x-rays were both negative for osteomyelitis of both feet. No major change in any of his wounds on the extremities however his buttock wounds are better. We have been using polymen on the buttocks, left lower leg. Iodoflex on the left foot and silver alginate on the right 3/22; arrives in clinic today with the 2 major issues are the improvement in the left dorsal foot wound which for once actually looks healthy with [name] nice healthy wound surface without debridement. Using Iodoflex here. Unfortunately on the left lateral calf which is in the distal part of his original wound he came to the clinic here for there was purulent drainage noted some increased breakdown scattered around the original area and [name] small area proximally. We we are using polymen here will change to silver alginate today. His buttock wound on the left is better and I think the area on the right first second toe webspace is  also improved 3/29; left dorsal foot looks better. Using Iodoflex. Left ankle culture from deterioration last time grew [name]. coli, Enterobacter and Enterococcus. I will give him [name] course of cefdinir although that will not cover Enterococcus. The area on the right foot in the webspace of the first and second toe lateral first toe looks better. The area on his buttock is about healed Vascular appointment is on April 21. This is to look at his venous system vis--vis continued breakdown of the wounds on the left including the left lateral leg and left dorsal foot he. He has had previous ablations on this side 4/5; the area between the right first and second toes lateral aspect of the first toe looks better. Dorsal aspect of the left first toe on the left foot also improved. Unfortunately the left lateral lower leg is larger and there is [name] second satellite wound superiorly. The usual superficial abrasions on the left buttock overall better but certainly not closed 4/12; the area between the right first and second toes is improved. Dorsal aspect of the left foot also slightly smaller with [name] vibrant healthy looking surface. No real change in the left lateral leg and the left buttock wound is healed He has an unaffordable co-pay for Apligraf. Appointment with vein and vascular with regards to the left leg venous part of the circulation is on 4/21 4/19; we continue to see improvement in all wound areas. Although this is minor. He has his vascular appointment on 4/21. The area on the left buttock has not reopened although right in the center of this area the skin looks somewhat threatened 4/26; the left buttock is unfortunately reopened. In general his left dorsal foot has [name] healthy surface and looks somewhat smaller although it was not measured as such. The area between his first and second toe webspace on the right as [name] small wound against the first toe. The patient saw vascular surgery. The real question I  was asking was about the small saphenous vein on the left. He has previously ablated left greater saphenous vein. Nothing further was commented on  on the left. Right greater saphenous vein without reflux at the saphenofemoral junction or proximal thigh there was no indication for ablation of the right greater saphenous vein duplex was negative for DVT bilaterally. They did not think there was anything from [name] vascular surgery point of view that could be offered. They ABIs within normal limits Poole, Markevius [name] ([id number]) [id number] Page 12 of 36 5/3; only small open area on the left buttock. The area on the left lateral leg which was his original venous reflux is now 2 wounds both which look clean. We are using Iodoflex on the left dorsal foot which looks healthy and smaller. He is down to [name] very tiny area between the right first and second toes, using silver alginate 5/10; all of his wounds appear better. We have much better edema control in 4 layer compression on the left. This may be the factor that is allowing the left foot and left lateral calf to heal. He has external compression garments at home 04/14/20-All of his wounds are progressing well, the left forefoot is practically closed, left ischium appears to be about the same, right toe webspace is also smaller. The left lateral leg is about the same, continue using Hydrofera Blue to this, silver alginate to the ischium, Iodoflex to the toe space on the right 6/7; most of his wounds outside of the left buttock are doing well. The area on the left lateral calf and left dorsal foot are smaller. The area on the right foot in between the first and second toe webspace is barely visible although he still says there is some drainage here is the only reason I did not heal this out. Unfortunately the area on the left buttock almost looks like he has [name] skin tear from tape. He has open wound and then [name] large flap of skin that we are  trying to get adherence over an area just next to the remaining wound 6/21; 2 week follow-up. I believe is been here for nurse visits. Miraculously the area between his first and second toes on the left dorsal foot is closed over. Still open on the right first second web space. The left lateral calf has 2 open areas. Distally this is more superficial. The proximal area had [name] little more depth and required debridement of adherent necrotic material. His buttock wound is actually larger we have been using silver alginate here 6/28; the patient's area on the left foot remains closed. Still open wet area between the first and second toes on the right and also extending into the plantar aspect. We have been using silver alginate in this location. He has 2 areas on the left lower leg part of his original long wounds which I think are better. We have been using Hydrofera Blue here. Hydrofera Blue to the left buttock which is stable 7/12; left foot remains closed. Left ankle is closed. May be [name] small area between his right first and second toes the only truly open area is on the left buttock. We have been using Hydrofera Blue here 7/19; patient arrives with marked deterioration especially in the left foot and ankle. We did not put him in [name] compression wrap on the left last week in fact he wore his juxta lite stockings on either side although he does not have an underlying stocking. He has [name] reopening on the left dorsal foot, left lateral ankle and [name] new area on the right dorsal ankle. More worrisome is the degree of erythema on  the left foot extending on the lateral foot into the lateral lower leg on the left 7/26; the patient had erythema and drainage from the lateral left ankle last week. Culture of this grew MRSA resistant to doxycycline and clindamycin which are the 2 antibiotics we usually use with this patient who has multiple antibiotic allergies including linezolid, trimethoprim sulfamethoxazole. I had  give him an empiric doxycycline and he comes in the area certainly looks somewhat better although it is blotchy in his lower leg. He has not been systemically unwell. He has had areas on the left dorsal foot which is [name] reopening, chronic wounds on the left lateral ankle. Both of these I think are secondary to chronic venous insufficiency. The area between his first and second toes is closed as far as I can tell. He had [name] new wrap injury on the right dorsal ankle last week. Finally he has an area on the left buttock. We have been using silver alginate to everything except the left buttock we are using Hydrofera Blue 06/30/20-Patient returns at 1 week, has been given [name] sample dose pack of NUZYRA which is [name] tetracycline derivative [omadacycline], patient has completed those, we have been using silver alginate to almost all the wounds except the left ischium where we are using Hydrofera Blue all of them look better 8/16; since I last saw the patient he has been doing well. The area on the left buttock, left lateral ankle and left foot are all closed today. He has completed the [place] I gave him last time and tolerated this well. He still has open areas on the right dorsal ankle and in the right first second toe area which we are using silver alginate. 8/23; we put him in his bilateral external compression stockings last week as he did not have anything open on either leg except for concerning area between the right first and second toe. He comes in today with an area on the left dorsal foot slightly more proximal than the original wound, the left lateral foot but this is actually [name] continuation of the area he had on the left lateral ankle from last time. As well he is opened up on the left buttock again. 8/30; comes in today with things looking [name] lot better. The area on the left lower ankle has closed down as has the left foot but with eschar in both areas. The area on the dorsal right ankle is also  epithelialized. Very little remaining of the left buttock wound. We have been using silver alginate on all wound areas 9/13; the area in the first second toe webspace on the right has fully epithelialized. He still has some vulnerable epithelium on the right and the ankle and the dorsal foot. He notes weeping. He is using his juxta lite stocking. On the left again the left dorsal foot is closed left lateral ankle is closed. We went to the juxta lite stocking here as well. Still vulnerable in the left buttock although only 2 small open areas remain here 9/27; 2-week follow-up. We did not look at his left leg but the patient says everything is closed. He is [name] bit disturbed by the amount of edema in his left foot he is using juxta lite stockings but asking about over the toes stockings which would be 30/40, will talk to him next time. According to him there is no open wound on either the left foot or the left ankle/calf He has an open area on the dorsal  right calf which I initially point [name] wrap injury. He has superficial remaining wound on the left ischial tuberosity been using silver alginate although he says this sticks to the wound 10/5; we gave him 2-week follow-up but he called yesterday expressing some concerns about his right foot right ankle and the left buttock. He came in early. There is still no open areas on the left leg and that still in his juxta lite stocking 10/11; he only has 1 small area on the left buttock that remains measuring millimeters 1 mm. Still has the same irritated skin in this area. We recommended zinc oxide when this eventually closes and pressure relief is meticulously is he can do this. He still has an area on the dorsal part of his right first through third toes which is [name] bit irritated and still open and on the dorsal ankle near the crease of the ankle. We have been using silver alginate and using his own stocking. He has nothing open on the left leg or foot 10/25;  2-week follow-up. Not nearly as good on the left buttock as I was hoping. For open areas with 5 looking threatened small. He has the erythematous irritated chronic skin in this area. 1 area on the right dorsal ankle. He reports this area bleeds easily Right dorsal foot just proximal to the base of his toes We have been using silver alginate. 11/8; 2-week follow-up. Left buttock is about the same although I do not think the wounds are in the same location we have been using silver alginate. I have asked him to use zinc oxide on the skin around the wounds. He still has [name] small area on the right dorsal ankle he reports this bleeds easily Right dorsal foot just proximal to the base of the toes does not have anything open although the skin is very dry and scaly He has [name] new opening on the nailbed of the left great toe. Nothing on the left ankle 11/29; 3-week follow-up. Left buttock has 2 open areas. And washing of these wounds today started bleeding easily. Suggesting very friable tissue. We have been using silver alginate. Right dorsal ankle which I thought was initially [name] wrap injury we have been using silver alginate. Nothing open between the toes that I can see. He states the area on the left dorsal toe nailbed healed after the last visit in 2 or 3 days 12/13; 3-week follow-up. His left buttock now has 3 open areas but the original 2 areas are smaller using polymen here. Surrounding skin looks better. The right dorsal ankle is closed. He has [name] small opening on the right dorsal foot at the level of the third toe. In general the skin looks better here. He is wearing his juxta lite stocking on the left leg says there is nothing open 11/24/2020; 3 [name] follow-up. His left buttock still has the 3 open areas. We have been using polymen but due to lack of response he changed to [facility] area. Surrounding skin is dry erythematous and irritated looking. There is no evidence of infection either bacterial  or fungal however there is loss of surface epithelium He still has very dry skin in his foot causing irritation and erythema on the dorsal part of his toes. This is not responded to prolonged courses of antifungal simply looks dry and irritated 1/24; left buttock area still looks about the same he was unable to find the triad ointment that we had suggested. The area on the right  lower leg just above the dorsal ankle has reopened and the areas on the right foot between the first second and second third toes and scaling on the bottom of the foot has been about the same for quite some time now. been using silver alginate to all wound areas 2/7; left buttock wound looked quite good although not much smaller in terms of surface area surrounding skin looks better. Only [name] few dry flaking areas on the right foot in between the first and second toes the skin generally looks better here [ammonium lactate]. Finally the area on the right dorsal ankle is closed 2/21; There is no open area on the right foot even between the right first and second toe. Skin around this area dorsally and plantar aspects look better. He has [name] reopening of the area on the right ankle just above the crease of the ankle dorsally. I continue to think that this is probably friction from spasms may be even this time with his stocking under the compression stockings. Wounds on his left buttock look about the same there [name] couple of areas that have reopened. He has [name] total square area of loss of epithelialization. This does not look like infection it looks like [name] contact dermatitis but I just cannot determine to what 3/14; there is nothing on the right foot between the first and second toes this was carefully inspected under illumination. Some chronic irritation on the dorsal part of his foot from toes 1-3 at the base. Nothing really open here substantially. Still has an area on the right foot/ankle that is actually larger and  hyper granulated. His buttock area on the left is just about closed however he has chronic inflammation with loss of the surface epithelial layer Wolanski, Trejan [name] ([id number]) [id number] Page 13 of 36 3/28; 2-week follow-up. In clinic today with [name] new wound on the left anterior mid tibia. Says this happened about 2 [name] ago. He is not really sure how wonders about the spasticity of his legs at night whether that could have caused this other than that he does not have [name] good idea. He has been using topical antibiotics and silver alginate. The area on his right dorsal ankle seems somewhat better. Finally everything on his left buttock is closed. 4/11; 2-week follow-up. All of his wounds are better except for the area over the ischium and left buttock which have opened up widely again. At least part of this is covered in necrotic fibrinous material another part had rolled nonviable skin. The area on the right ankle, left anterior mid tibia are both [name] lot better. He had no open wounds on either foot including the areas between the first and second toes 4/25; patient presents for 2-week follow-up. He states that the wounds are overall stable. He has no complaints today and states he is using Hydrofera Blue to open wounds. 5/9; have not seen this man in over [name] month. For my memory he has open areas on the left mid tibia and right ankle. T oday he has new open area on the right dorsal foot which we have not had [name] problem with recently. He has the sustained area on the left buttock He is also changed his insurance at the beginning of the year [employer]. We will need prior authorizations for debridement 5/23; patient presents for 2-week follow-up. He has prior authorizations for debridement. He denies any issues in the past 2 [name] with his wound care. He has been using [employer]  to all the wounds. He does report [name] circular rash to the upper left leg that is new. He denies  acute signs of infection. 6/6; 2-week follow-up. The patient has open wounds on the left buttock which are worse than the last time I saw this about [name] month ago. He also has [name] new area to me on the left anterior mid tibia with some surrounding erythema. The area on the dorsal ankle on the right is closed but I think this will be [name] friction injury every time this area is exposed to either our wraps or his compression stockings caused by unrelenting spasms in this leg. 6/20; 2-week follow-up. The patient has open wounds on the left buttock which is about the same. Using [facility] here. - The left mid tibia has [name] static amount of surrounding erythema. Also [name] raised area in the center. We have been using Hydrofera Blue here. Finally he has broken down in his dorsal right foot extending between the first and second toes and going to the base of the first and second toe webspace. I have previously assumed that this was severe venous hypertension 7/5; 2-week follow-up The left buttock wound actually looks better. We are using Hydrofera Blue. He has extensive skin irritation around this area and I have not really been able to get that any better. I have tried Lotrisone i.[name]. antifungals and steroids. More most recently we have just been using Coloplast really looks about the same. The left mid tibia which was new last week culture to have very resistant staph aureus. Not only methicillin-resistant but doxycycline resistant. The patient has [name] plethora of antibiotic allergies including sulfa, linezolid. I used topical bacitracin on this but he has not started this yet. In addition he has an expanding area of erythema with [name] wound on the dorsal right foot. I did [name] deep tissue culture of this area today 7/12; Left buttock area actually looks better surrounding skin also looks less irritated. Left mid tibia looks about the same. He is using bacitracin this is not worse Right dorsal foot looks about the same  as well. The left first toe also looks about the same 7/19; left buttock wound continues to improve in terms of open areas Left mid tibia is still concerning amount of swelling he is using bacitracin Dorsal left first toe somewhat smaller Right dorsal foot somewhat smaller 7/25; left buttock wound actually continues to improve Left mid tibia area has less swelling. I gave him all my samples of new Nuzyra. This seems to have helped although the wound is still open it. His abrasion closed by here Left dorsal great toe really no better. Still [name] very nonviable surface Right dorsal foot perhaps some better. We have been using bacitracin and silver nitrate to the areas on his lower legs and Hydrofera Blue to the area on the buttock. 8/16 Disappointed that his left buttock wound is actually more substantial. Apparently during the last nurse visit these were both very small. He has continued irritation to [name] large area of skin on his buttock. I have never been able to totally explain this although I think it some combination of the way he sits, pressure, moisture. He is not incontinent enough to contribute to this. Left dorsal great toe still fibrinous debris on the surface that I have debrided today Large area across the dorsal right toes. The area on the left anterior mid tibia has less swelling. He completed the [place]. This does not look infected  although the tissue is still fried 8/30; 2-week follow-up. Left buttock areas not improved. We used Hydrofera Blue on this. Weeping wet with the surrounding erythema that I have not been able to control even with Lotrisone and topical Coloplast Left dorsal great toe looks about the same More substantial area again at the base of his toes on the left which is new this week. Area across the dorsal right toes looks improved The left anterior mid tibia looks like it is trying to close 9/13; 2-week follow-up. Using silver alginate on all of his wounds. The  left dorsal foot does not look any better. He has the area on the dorsal toe and also the areas at the base of all of his toes 1 through 3. On the right foot he has [name] similar pattern in [name] similar area. He has the area on his left mid tibia that looks fairly healthy. Finally the area on his left buttock looks somewhat bette 9/20; culture I did of the left foot which was [name] deep tissue culture last time showed [name]. coli he has erythema around this wound. Still [name] completely necrotic surface. His right dorsal foot looks about the same. He has [name] very friable surface to the left anterior mid tibia. Both buttock wounds look better. We have been using silver alginate to all wounds 10/4; he has completed the cephalexin that I gave him last time for the left foot. He is using topical gentamicin under silver alginate silver alginate being applied to all the wounds. Unfortunately all the wounds look irritated on his dorsal right foot dorsal left foot left mid tibia. I wonder if this could be [name] silver allergy. I am going to change him to [facility] on the lower extremity. The skin on the left buttock and left posterior thigh still flaking dry and irritated. This has continued no matter what I have applied topically to this. He has [name] solitary open wound which by itself does not look too bad however the entire area of surrounding skin does not change no matter what we have applied here 10/18; the area on the left dorsal foot and right dorsal foot both look better. The area on the right extends into the plantar but not between his toes. We have been using silver alginate. He still has [name] rectangular erythematous area around the area on the left tibia. The wound itself is very small. Finally everything on his left buttock looks [name] little larger the skin is erythematous 11/15; patient comes in with the left dorsal and right dorsal foot distally looking somewhat better. Still nonviable surface on the left foot which  required debridement. He still has the area on the left anterior mid tibia although this looks somewhat better. He has [name] new area on the right lateral lower leg just above the ankle. Finally his left buttock looks terrible with multiple superficial open wounds geometric square shaped area of chronic erythema which I have not been able to sort through 11/29; right dorsal foot and left dorsal foot both look somewhat better. No debridement required. He has the fragile area on the left anterior mid tibia this looks and continues to look somewhat better. Right lateral lower leg just above the ankle we identified last time also looks better. In general the area on his left buttock looks improved. We are using Hydrofera Blue to all wound areas 12/13; right dorsal foot looks better. The area on the right lateral leg is healed. Left dorsal foot has  2 open areas both of which require debridement. The fragile area on the left anterior mid tibial looks better. Smaller area on his buttocks. Were using Hydrofera Blue 1/10; patient comes in with everything looking slightly larger and/or worse. This includes his left buttock, reopening of the left mid tibia, larger areas on the left dorsal foot and what looks to be [name] cellulitis on the right dorsal and plantar foot. We have been using Hydrofera Blue on all wounds. 1/17; right dorsal foot distally looks better today. The left foot has 2 open wounds that are about the same surrounding erythema. Culture I did last week showed rare Enterococcus and [name] multidrug resistant MRSA. 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 No malignancy they did not stain for fungus 1/24; his right distal foot is not closed dry and scaly but the wound looks like it is contracting. I did not debride anything here. Problem on his left dorsal foot with expanding  erythema. Apparently there were problems last week getting the Elesa Hacker however it is now available at the [facility] but [name] week later. He is using ketoconazole and Coloplast to the left buttock along with Hydrofera Blue this actually looks quite [name] bit better today. 1/31; right dorsal foot again is dry and scaly but looking to contract. He has been using [name] moisturizer on his feet at my request but he is not sure which 1. The left dorsal foot wounds look about the same there is erythema here that I marked last week however after course of Nuzyra it certainly is not any better but not any worse either. Finally on his left buttock the skin continues to look better he has the original wound but [name] new substantial area towards the gluteal cleft. Almost like [name] skin tear. I used scissors to remove skin and subcutaneous tissue here silver nitrate and direct pressure 2/7; right dorsal foot. This does not look too much different from last week. Some erythema skin dry and scaly. No debridement. Left dorsal great toe again still not much improvement. I did remove flaking dry skin and callus from around the edge. Finally on his left buttock. The skin is somewhat better in the periwound. Surface wounds are superficial somewhat better than last week. 01/26/2022: Is [name] little bit of [name] mystery as to why his wounds fail to respond to treatments and actually seem to get worse. This is my first encounter with this patient. He was previously followed by Dr. Dellia Nims. Based upon my review of the chart, it seems that there is [name] little bit of [name] mystery as to why his wounds do not respond as anticipated to the interventions applied and sometimes even get worse. Biopsies have been performed and he was seen by dermatology in [place], but that did not shed any light on the matter. T oday, his gluteal wound is larger, with substantial drainage, rather malodorous. The food wounds are not terrible, but he has [name] lot of callus and  scaly skin around these. He is currently getting silver alginate on the gluteal wound, with idodoflex to the feet. He is using lotrisone on his legs for the dry, scaly skin. 02/09/2022: There has really been no change to any of his wounds. The gluteal wound less drainage and odor, but remains about the same size, the periwound skin remains oddly scaly. His lower extremity wounds also appear roughly the same size. They continue to  accumulate [name] small amount of slough. The periwound on his feet and ankle wounds has dry eschar and loose dead skin. We have been using silver alginate on the gluteal wound and Iodoflex on his feet and ankle wounds. T the periwound around his gluteal lesion and Lotrisone on his feet and legs. o 02/23/2022: The right plantar foot wound is closed. The gluteal site looks small but has continued to produce hypertrophic granulation tissue. The foot wounds all look about the same on the dorsal surface of the right foot; on the left, there is only [name] small open area at the site of where his left great toenail would have been. 03/16/2022: The right ankle wound is healed. The right dorsal foot wound is about the same. The left dorsal foot wound is quite [name] bit smaller and the ischial wound is nearly closed. 03/30/2022: The right ankle wound reopened. Both dorsal foot wounds are quite [name] bit smaller. Unfortunately, he appears to have sheared part of his ischial wound open further, perhaps during [name] transfer. 04/13/2022: The right ankle wound has hypertrophic granulation tissue present. The dorsal foot wounds continue to decrease in size. The ischial wound looks about the same today, no better, no worse. 04/27/2022: The right ankle wound has closed. Unfortunately, it looks like some moisture got underneath the dry skin on both of his dorsal feet and these wounds have expanded in size. The ischial wound remains the same with perhaps [name] little bit more slough accumulation than at our previous  visit. 05/11/2022: The right ankle wound remains closed. There is [name] left anterior tibial wound that is small has patchy openings with accumulated slough. The dorsum of his right foot appears to be nearly healed with just [name] small punctate opening. The plantar surface of his right foot has [name] new opening that looks like he may have picked some skin there. His sacral ulcer has hypertrophic granulation tissue but has some slough accumulation. The dorsum of his left foot has multiple open areas in [name] fairly ragged distribution. All of these have slough accumulated within them. 06/01/2022: The right ankle and left anterior tibial wound are both closed. Dorsum of his right foot and left foot both look substantially better with just tiny scattered openings The without any slough accumulation. He has sheared open new areas on his left gluteus and ischium. He says that his wheelchair cushion, which is air-filled, has [name] leak and so it keeps deflating. He is awaiting [name] new cushion. 06/15/2022: The right ankle wound has reopened and the fat layer is exposed. Both dorsal feet have just small openings with just [name] little bit of slough and eschar accumulation. The wound on his left gluteus and ischium is larger again today and has [name] foul odor. 06/29/2022: The right ankle wound has hypertrophic granulation tissue buildup. His dorsal foot wounds both look better with just some eschar on the surface. He has [name] new wound on his left lateral ankle. He is not sure how he acquired it but by appearance, it looks that he hit it on something, potentially his wheelchair or bed. The ischial wound is about the same but is cleaner without any significant purulent drainage or odor. He did not understand what the [facility] call was about and therefore he does not have the topical compounded antibiotic. 07/13/2022: The right ankle wound again has hypertrophic granulation tissue, but less so than at his previous visit. The ischial wound has  improved tremendously the use of the [facility] topical antibiotic. No  significant change to the left lateral ankle wound; it is fibrotic with slough present. The skin of both of his feet, especially on the right, has [name] yeasty appearance. 08/10/2022: There is again hypertrophic granulation tissue on the right anterior ankle wound. Both feet are about the same. The left lateral ankle wound is [name] little bit desiccated and has some slough buildup. He unfortunately suffered [name] new injury when he was removing his pants and they caught his bandage which caused [name] large skin tear on his left ischium, just distal to the existing wound. The existing left ischial wound, however, is significantly better with just [name] little light slough on the surface. 08/31/2022: The right anterior ankle wound is [name] lot smaller today underneath some eschar. No accumulation of hypertrophic granulation tissue. The left lateral ankle wound has some slough on the surface but is better in terms of moisture balance this week. The left dorsal foot does not really have any openings on it today. The right dorsal foot has some slough and eschar accumulation. His gluteal ulcer is basically closed aside from 2 small areas that are oozing [name] bit. 09/21/2022: The right anterior ankle wound is down to just [name] tiny pinhole. The left lateral ankle wound has accumulated slough again, but moisture balance is good. Left and right dorsal feet both have 2 small openings with some slough in them. The gluteal ulcer, unfortunately has opened up substantially. 10/05/2022: The right anterior ankle wound has reopened and has [name] fair amount of slough on the surface. The left lateral ankle wound has accumulated more slough, as well. He was approved for Apligraf, but the wound is not clean enough yet. There is some slough buildup on the dorsal right foot wound, as well as on his ischium. He did not pick up the doxycycline I prescribed for the MRSA that grew out of his  ankle wound culture. 10/26/2022: The right anterior ankle wound has closed again. The left lateral ankle wound looks quite [name] bit better. It is ready for Apligraf but we do not have 1 here for him. His ischium continues to get worse, with the wound larger and deeper. It really seems like this is [name] combination of pressure and friction. He has 2 new wounds, 1 on the plantar aspect of each foot. He says that he had some dry skin there that peeled away. Both are superficial. The dorsum of his left foot does not have any new wound opening. The dorsum of his right foot has [name] bit of slough accumulation. 11/24/2022: The right anterior ankle wound has 2 small open areas, both covered with eschar. The left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 nurse noted significant erythema of his dorsal right foot. He is also running [name] low- grade fever and having increasing spasms in the  right leg 09/20/16 here for cellulitis involving his right great toes and forefoot. This is [name] lot better. Still requiring debridement on his left lateral leg. Santyl direct says he needs prior authorization. Therefore his wife cannot change this at home 09/30/16; the patient's extensive area on the left lateral calf and ankle perhaps somewhat better. Using Santyl. The area on the left toes is healed and I think  the area on his right dorsal foot is healed as well. There is no cellulitis or venous inflammation involving the right leg. He is going to need compression stockings here. 10/07/16; the patient's extensive wound on the left lateral calf and ankle does not measure any differently however there appears to be less adherent surface slough using Santyl and aggressive weekly debridements 10/21/16; no major change in the area on the left lateral calf. Still the same measurement still very difficult to debridement adherent slough and nonviable subcutaneous tissue. This is not really been helped by several [name] of Santyl. Previously for 2 [name] I used Iodoflex for [name] short period. [name] prolonged course of Hydrofera Blue didn't really help. I'm not sure why I only used 2 [name] of Iodoflex on this there is no evidence of surrounding infection. He has [name] small area on the right second toe which looks as though it's progressing towards closure 10/28/16; the wounds on his toes appear to be closed. No major change in the left lateral leg wound although the surface looks somewhat better using Iodoflex. He has had previous arterial studies that were normal. He has had reflux studies and is status post ablation although I don't have any exact notes on which vein was ablated. I'll need to check the surgical record 11/04/16; he's had [name] reopening between the first and second toe on the left and right. No major change in the left lateral leg wound. There is what appears to be cellulitis of the left dorsal foot Babich, Ved [name] ([id number]) [id number] Page 21 of 36 11/18/16 the patient was hospitalized initially in [place] and then subsequently transferred to [facility] long and was admitted there from 11/09/16 through 11/12/16. He had developed progressive cellulitis on the right leg in spite of the doxycycline I gave him. I'd spoken to the hospitalist in [place] who was concerned about continuing  leukocytosis. CT scan is what I suggested this was done which showed soft tissue swelling without evidence of osteomyelitis or an underlying abscess blood cultures were negative. At [facility] he was treated with vancomycin and Primaxin and then add an infectious disease consult. He was transitioned to Ceftaroline. He has been making progressive improvement. Overall [name] severe cellulitis of the right leg. He is been using silver alginate to her original wound on the left leg. The wounds in his toes on the right are closed there is [name] small open area on the base of the left second toe 11/26/15; the patient's right leg is much better although there is still some edema here this could be reminiscent from his severe cellulitis likely on top of some degree of lymphedema. His left anterior leg wound has less surface slough as reported by her intake nurse. Small wound at the base of the left second toe 12/02/16; patient's right leg is better and there is no open wound here. His left anterior lateral leg wound continues to have [name] healthy-looking surface. Small wound at the base of the left second toe however there is erythema in the left forefoot which is worrisome 12/16/16; is no open wounds  on his right leg. We took measurements for stockings. His left anterior lateral leg wound continues to have [name] healthy-looking surface. I'm not sure where we were with the Apligraf run through his insurance. We have been using Iodoflex. He has [name] thick eschar on the left first second toe interface, I suspect this may be fungal however there is no visible open 12/23/16; no open wound on his right leg. He has 2 small areas left of the linear wound that was remaining last week. We have been using Prisma, I thought I have disclosed this week, we can only look forward to next week 01/03/17; the patient had concerning areas of erythema last week, already on doxycycline for UTI through his primary doctor. The erythema is absolutely  no better there is warmth and swelling both medially from the left lateral leg wound and also the dorsal left foot. 01/06/17- Patient is here for follow-up evaluation of his left lateral leg ulcer and bilateral feet ulcers. He is on oral antibiotic therapy, tolerating that. Nursing staff and the patient states that the erythema is improved from Monday. 01/13/17; the predominant left lateral leg wound continues to be problematic. I had put Apligraf on him earlier this month once. However he subsequently developed what appeared to be an intense cellulitis around the left lateral leg wound. I gave him Dalvance I think on 2/12 perhaps 2/13 he continues on cefdinir. The erythema is still present but the warmth and swelling is improved. I am hopeful that the cellulitis part of this control. I wouldn't be surprised if there is an element of venous inflammation as well. 01/17/17. The erythema is present but better in the left leg. His left lateral leg wound still does not have [name] viable surface buttons certain parts of this long thin wound it appears like there has been improvement in dimensions. 01/20/17; the erythema still present but much better in the left leg. I'm thinking this is his usual degree of chronic venous inflammation. The wound on the left leg looks somewhat better. Is less surface slough 01/27/17; erythema is back to the chronic venous inflammation. The wound on the left leg is somewhat better. I am back to the point where I like to try an Apligraf once again 02/10/17; slight improvement in wound dimensions. Apligraf #2. He is completing his doxycycline 02/14/17; patient arrives today having completed doxycycline last Thursday. This was supposed to be [name] nurse visit however once again he hasn't tense erythema from the medial part of his wound extending over the lower leg. Also erythema in his foot this is roughly in the same distribution as last time. He has baseline chronic venous inflammation  however this is [name] lot worse than the baseline I have learned to accept the on him is baseline inflammation 02/24/17- patient is here for follow-up evaluation. He is tolerating compression therapy. His voicing no complaints or concerns he is here anticipating an Apligraf 03/03/17; he arrives today with an adherent necrotic surface. I don't think this is surface is going to be amenable for Apligraf's. The erythema around his wound and on the left dorsal foot has resolved he is off antibiotics 03/10/17; better-looking surface today. I don't think he can tolerate Apligraf's. He tells me he had [name] wound VAC after [name] skin graft years ago to this area and they had difficulty with [name] seal. The erythema continues to be stable around this some degree of chronic venous inflammation but he also has recurrent cellulitis. We have been using Iodoflex  03/17/17; continued improvement in the surface and may be small changes in dimensions. Using Iodoflex which seems the only thing that will control his surface 03/24/17- He is here for follow up evaluation of his LLE lateral ulceration and ulcer to right dorsal foot/toe space. He is voicing no complaints or concerns, He is tolerating compression wrap. 03/31/17 arrives today with [name] much healthier looking wound on the left lower extremity. We have been using Iodoflex for [name] prolonged period of time which has for the first time prepared and adequate looking wound bed although we have not had much in the way of wound dimension improvement. He also has [name] small wound between the first and second toe on the right 04/07/17; arrives today with [name] healthy-looking wound bed and at least the top 50% of this wound appears to be now her. No debridement was required I have changed him to [facility] last week after prolonged Iodoflex. He did not do well with Apligraf's. We've had [name] re-opening between the first and second toe on the right 04/14/17; arrives today with [name] healthier looking wound bed  contractions and the top 50% of this wound and some on the lesser 50%. Wound bed appears healthy. The area between the first and second toe on the right still remains problematic 04/21/17; continued very gradual improvement. Using [facility] 04/28/17; continued very gradual improvement in the left lateral leg venous insufficiency wound. His periwound erythema is very mild. We have been using Hydrofera Blue. Wound is making progress especially in the superior 50% 05/05/17; he continues to have very gradual improvement in the left lateral venous insufficiency wound. Both in terms with an length rings are improving. I debrided this every 2 [name] with #5 curet and we have been using Hydrofera Blue and again making good progress With regards to the wounds between his right first and second toe which I thought might of been tinea pedis he is not making as much progress very dry scaly skin over the area. Also the area at the base of the left first and second toe in [name] similar condition 05/12/17; continued gradual improvement in the refractory left lateral venous insufficiency wound on the left. Dimension smaller. Surface still requiring debridement using Hydrofera Blue 05/19/17; continued gradual improvement in the refractory left lateral venous ulceration. Careful inspection of the wound bed underlying rumination suggested some degree of epithelialization over the surface no debridement indicated. Continue Hydrofera Blue difficult areas between his toes first and third on the left than first and second on the right. I'm going to change to silver alginate from silver collagen. Continue ketoconazole as I suspect underlying tinea pedis 05/26/17; left lateral leg venous insufficiency wound. We've been using Hydrofera Blue. I believe that there is expanding epithelialization over the surface of the wound albeit not coming from the wound circumference. This is [name] bit of an odd situation in which the epithelialization  seems to be coming from the surface of the wound rather than in the exact circumference. There is still small open areas mostly along the lateral margin of the wound. ooHe has unchanged areas between the left first and second and the right first second toes which I been treating for tenia pedis 06/02/17; left lateral leg venous insufficiency wound. We have been using Hydrofera Blue. Somewhat smaller from the wound circumference. The surface of the wound remains [name] bit on it almost epithelialized sedation in appearance. I use an open curette today debridement in the surface of all of this especially  the edges ooSmall open wounds remaining on the dorsal right first and second toe interspace and the plantar left first second toe and her face on the left 06/09/17; wound on the left lateral leg continues to be smaller but very gradual and very dry surface using Hydrofera Blue 06/16/17 requires weekly debridements now on the left lateral leg although this continues to contract. I changed to silver collagen last week because of dryness of the wound bed. Using Iodoflex to the areas on his first and second toes/web space bilaterally 06/24/17; patient with history of paraplegia also chronic venous insufficiency with lymphedema. Has [name] very difficult wound on the left lateral leg. This has been gradually reducing in terms of with but comes in with [name] very dry adherent surface. High switch to silver collagen [name] week or so ago with hydrogel to keep the area moist. This is been refractory to multiple dressing attempts. He also has areas in his first and second toes bilaterally in the anterior and posterior web space. I had been using Iodoflex here after [name] prolonged course of silver alginate with ketoconazole was ineffective [question tinea pedis] 07/14/17; patient arrives today with [name] very difficult adherent material over his left lateral lower leg wound. He also has surrounding erythema and poorly controlled edema. He was  switched his Santyl last visit which the nurses are applying once during his doctor visit and once on [name] nurse visit. He was also reduced to 2 layer compression I'm not exactly sure of the issue here. 07/21/17; better surface today after 1 week of Iodoflex. Significant cellulitis that we treated last week also better. [Doxycycline] 07/28/17 better surface today with now 2 [name] of Iodoflex. Significant cellulitis treated with doxycycline. He has now completed the doxycycline and he is back to his usual degree of chronic venous inflammation/stasis dermatitis. He reminds me he has had ablations surgery here 08/04/17; continued improvement with Iodoflex to the left lateral leg wound in terms of the surface of the wound although the dimensions are better. 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 Problematic areas on the plantar aspect of the first second toe web space on the left and the dorsal aspect of the first second toe web space on the right. At one point I felt these were probably related to chronic fungal infections in treated him aggressively for this although we have not made any improvement here. Schueller, CARNEL STEGMAN ([id number]) [id number].pdf Page 22 of 36 08/11/17; left lateral leg. Surface continues to improve with the Iodoflex although we are not seeing much improvement in overall wound dimensions. Areas on his plantar left foot and right foot show no improvement. In fact the right foot looks somewhat worse 08/18/17; left lateral leg. We changed to [facility] Blue last week after [name] prolonged course of Iodoflex which helps get the surface better. It appears that the wound with is improved. Continue with difficult areas on the left dorsal first second and plantar first second on the right 09/01/17; patient arrives in clinic today having had [name] temperature of 103 yesterday. He was seen in the ER and [facility]. The  patient was concerned he could have cellulitis again in the right leg however they diagnosed him with [name] UTI and he is now on Keflex. He has [name] history of cellulitis which is been recurrent and difficult but this is been in the left leg, in the past 5 use doxycycline. He does in and out catheterizations at  home which are risk factors for UTI 09/08/17; patient will be completing his Keflex this weekend. The erythema on the left leg is considerably better. He has [name] new wound today on the medial part of the right leg small superficial almost looks like [name] skin tear. He has worsening of the area on the right dorsal first and second toe. His major area on the left lateral leg is better. Using Hydrofera Blue on all areas 09/15/17; gradual reduction in width on the long wound in the left lateral leg. No debridement required. He also has wounds on the plantar aspect of his left first second toe web space and on the dorsal aspect of the right first second toe web space. 09/22/17; there continues to be very gradual improvements in the dimensions of the left lateral leg wound. He hasn't round erythematous spot with might be pressure on his wheelchair. There is no evidence obviously of infection no purulence no warmth ooHe has [name] dry scaled area on the plantar aspect of the left first second toe ooImproved area on the dorsal right first second toe. 09/29/17; left lateral leg wound continues to improve in dimensions mostly with an is still [name] fairly long but increasingly narrow wound. ooHe has [name] dry scaled area on the plantar aspect of his left first second toe web space ooIncreasingly concerning area on the dorsal right first second toe. In fact I am concerned today about possible cellulitis around this wound. The areas extending up his second toe and although there is deformities here almost appears to abut on the nailbed. 10/06/17; left lateral leg wound continues to make very gradual progress. Tissue culture I  did from the right first second toe dorsal foot last time grew MRSA and enterococcus which was vancomycin sensitive. This was not sensitive to clindamycin or doxycycline. He is allergic to Zyvox and sulfa we have therefore arrange for him to have dalvance infusion tomorrow. He is had this in the past and tolerated it well 10/20/17; left lateral leg wound continues to make decent progress. This is certainly reduced in terms of with there is advancing epithelialization.ooThe cellulitis in the right foot looks better although he still has [name] deep wound in the dorsal aspect of the first second toe web space. Plantar left first toe web space on the left I think is making some progress 10/27/17; left lateral leg wound continues to make decent progress. Advancing epithelialization.using Hydrofera Blue ooThe right first second toe web space wound is better-looking using silver alginate ooImprovement in the left plantar first second toe web space. Again using silver alginate 11/03/17 left lateral leg wound continues to make decent progress albeit slowly. Using Hydrofera Blue ooThe right per second toe web space continues to be [name] very problematic looking punched out wound. I obtained [name] piece of tissue for deep culture I did extensively treated this for fungus. It is difficult to imagine that this is [name] pressure area as the patient states other than going outside he doesn't really wear shoes at home ooThe left plantar first second toe web space looked fairly senescent. Necrotic edges. This required debridement oochange to Hydrofera Blue to all wound areas 11/10/17; left lateral leg wound continues to contract. Using Hydrofera Blue ooOn the right dorsal first second toe web space dorsally. Culture I did of this area last week grew MRSA there is not an easy oral option in this patient was multiple antibiotic allergies or intolerances. This was only [name] rare culture isolate I'm therefore going to  use Bactroban  under silver alginate ooOn the left plantar first second toe web space. Debridement is required here. This is also unchanged 11/17/17; left lateral leg wound continues to contract using Hydrofera Blue this is no longer the major issue. ooThe major concern here is the right first second toe web space. He now has an open area going from dorsally to the plantar aspect. There is now wound on the inner lateral part of the first toe. Not [name] very viable surface on this. There is erythema spreading medially into the forefoot. ooNo major change in the left first second toe plantar wound 11/24/17; left lateral leg wound continues to contract using Hydrofera Blue. Nice improvement today ooThe right first second toe web space all of this looks [name] lot less angry than last week. I have given him clindamycin and topical Bactroban for MRSA and terbinafine for the possibility of underlining tinea pedis that I could not control with ketoconazole. Looks somewhat better ooThe area on the plantar left first second toe web space is weeping with dried debris around the wound 12/01/17; left lateral leg wound continues to contract he Hydrofera Blue. It is becoming thinner in terms of with nevertheless it is making good improvement. ooThe right first second toe web space looks less angry but still [name] large necrotic-looking wounds starting on the plantar aspect of the right foot extending between the toes and now extensively on the base of the right second toe. I gave him clindamycin and topical Bactroban for MRSA anterior benefiting for the possibility of underlying tinea pedis. Not looking better today ooThe area on the left first/second toe looks better. Debrided of necrotic debris 12/05/17* the patient was worked in urgently today because over the weekend he found blood on his incontinence bad when he woke up. He was found to have an ulcer by his wife who does most of his wound care. He came in today for [place] to look at  this. He has not had [name] history of wounds in his buttocks in spite of his paraplegia. 12/08/17; seen in follow-up today at his usual appointment. He was seen earlier this week and found to have [name] new wound on his buttock. We also follow him for wounds on the left lateral leg, left first second toe web space and right first second toe web space 12/15/17; we have been using Hydrofera Blue to the left lateral leg which has improved. The right first second toe web space has also improved. Left first second toe web space plantar aspect looks stable. The left buttock has worsened using Santyl. Apparently the buttock has drainage 12/22/17; we have been using Hydrofera Blue to the left lateral leg which continues to improve now 2 small wounds separated by normal skin. He tells [place] he had [name] fever up to 100 yesterday he is prone to UTIs but has not noted anything different. He does in and out catheterizations. The area between the first and second toes today does not look good necrotic surface covered with what looks to be purulent drainage and erythema extending into the third toe. I had gotten this to something that I thought look better last time however it is not look good today. He also has [name] necrotic surface over the buttock wound which is expanded. I thought there might be infection under here so I removed [name] lot of the surface with [name] #5 curet though nothing look like it really needed culturing. He is been using Santyl to this area 12/27/17;  his original wound on the left lateral leg continues to improve using Hydrofera Blue. I gave him samples of Baxdella although he was unable to take them out of fear for an allergic reaction ["lump in his throat"].the culture I did of the purulent drainage from his second toe last week showed both enterococcus and [name] set Enterobacter I was also concerned about the erythema on the bottom of his foot although paradoxically although this looks somewhat better today. 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 We've been using silver alginate here. They came up today that he sleeps in [name] recliner, I'm not sure why but I asked him to stop this 01/03/18; his original wound we've been using Hydrofera Blue is now separated into 2 areas. ooUlcer on his left buttock is better he is off the recliner and sleeping in bed ooFinally both wound areas between his first and second toes also looks some better 01/10/18; his original wound on the left lateral leg is now separated into 2 wounds we've been using Hydrofera Blue ooUlcer on his left buttock has some drainage. There is [name] small probing site going into muscle layer superiorly.using silver alginate -He arrives today with [name] deep tissue injury on the left heel ooThe wound on the dorsal aspect of his first second toe on the left looks [name] lot betterusing silver alginate ketoconazole ooThe area on the first second toe web space on the right also looks [name] lot bette 01/17/18; his original wound on the left lateral leg continues to progress using Hydrofera Blue ooUlcer on his left buttock also is smaller surface healthier except for [name] small probing site going into the muscle layer superiorly. 2.4 cm of tunneling in this area ooDTI on his left heel we have only been offloading. Looks better than last week no threatened open no evidence of infection oothe wound on the dorsal aspect of the first second toe on the left continues to look like it's regressing we have only been using silver alginate and terbinafine orally ooThe area in the first second toe web space on the right also looks to be [name] lot better using silver alginate and terbinafine I think this was prompted by tinea pedis Pepitone, Abdulla [name] ([id number]) [id number] Page 23 of 36 01/31/18; the patient was hospitalized in [place] last week apparently for [name] complicated  UTI. He was discharged on cefepime he does in and out catheterizations. In the hospital he was discovered M I don't mild elevation of AST and ALT and the terbinafine was stopped.predictably the pressure ulcer on s his buttock looks betterusing silver alginate. The area on the left lateral leg also is better using Hydrofera Blue. The area between the first and second toes on the left better. First and second toes on the right still substantial but better. Finally the DTI on the left heel has held together and looks like it's resolving 02/07/18-he is here in follow-up evaluation for multiple ulcerations. He has new injury to the lateral aspect of the last issue [name] pressure ulcer, he states this is from adhesive removal trauma. He states he has tried multiple adhesive products with no success. All other ulcers appear stable. The left heel DTI is resolving. We will continue with same treatment plan and follow-up next week. 02/14/18; follow-up for multiple areas. ooHe has [name] new area last week on the lateral aspect of his pressure ulcer more over the posterior trochanter.  The original pressure ulcer looks quite stable has healthy granulation. 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 Almost fully epithelialized on the original superior area using Hydrofera Blue ooDTI on the left heel has peeled off this week to reveal [name] small superficial wound under denuded skin and subcutaneous tissue ooBoth areas between the first and second toes look better including nothing open on the left 02/21/18; ooThe patient's wounds on his left ischial tuberosity and posterior left greater trochanter actually looked better. He has [name] large area of irritation around the area which I think is contact dermatitis. I am doubtful that this is fungal ooHis original wound on the left lateral calf continues to improve we have been  using Hydrofera Blue ooThere is no open area in the left first second toe web space although there is [name] lot of thick callus ooThe DTI on the left heel required debridement today of necrotic surface eschar and subcutaneous tissue using silver alginate ooFinally the area on the right first second toe webspace continues to contract using silver alginate and ketoconazole 02/28/18 ooLeft ischial tuberosity wounds look better using silver alginate. ooOriginal wound on the left calf only has one small open area left using Hydrofera Blue ooDTI on the left heel required debridement mostly removing skin from around this wound surface. Using silver alginate ooThe areas on the right first/second toe web space using silver alginate and ketoconazole 03/08/18 on evaluation today patient appears to be doing decently well as best I can tell in regard to his wounds. This is the first time that I have seen him as he generally is followed by Dr. Dellia Nims. With that being said none of his wounds appear to be infected he does have an area where there is some skin covering what appears to be [name] new wound on the left dorsal surface of his great toe. This is right at the nail bed. With that being said I do believe that debrided away some of the excess skin can be of benefit in this regard. Otherwise he has been tolerating the dressing changes without complication. 03/14/18; patient arrives today with the multiplicity of wounds that we are following. He has not been systemically unwell ooOriginal wound on the left lateral calf now only has 2 small open areas we've been using Hydrofera Blue which should continue ooThe deep tissue injury on the left heel requires debridement today. We've been using silver alginate ooThe left first second toe and the right first second toe are both are reminiscence what I think was tinea pedis. Apparently some of the callus Surface between the toes was removed last week when it started  draining. ooPurulent drainage coming from the wound on the ischial tuberosity on the left. 03/21/18-He is here in follow-up evaluation for multiple wounds. 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 He tolerated debridement. The only change to last week's recommendations is to discontinue antifungal cream between toes. He will follow-up next week 03/28/18; following up for multiple wounds;Concern this week is streaking redness and swelling in the right foot. He is going to need antibiotics for this. 03/31/18; follow-up for right foot cellulitis. Streaking redness and swelling in the right foot on 03/28/18. 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 I put him on clindamycin 300 mg every 6 and brought him in for [name] quick check. He has an  open wound between his first and second toes on the right foot as [name] potential source. 04/04/18; ooRight foot cellulitis is resolving he is completing clindamycin. This is truly good news ooLeft lateral calf wound which is initial wound only has one small open area inferiorly this is close to healing out. He has compression stockings. We will use Hydrofera Blue right down to the epithelialization of this ooNonviable surface on the left heel which was initially pressure with [name] DTI. We've been using Hydrofera Blue. I'm going to switch this back to silver alginate ooLeft first second toe/tinea pedis this looks better using silver alginate ooRight first second toe tinea pedis using silver alginate ooLarge pressure ulcers on theLeft ischial tuberosity. Small wound here Looks better. I am uncertain about the surface over the large wound. Using silver alginate 04/11/18; ooCellulitis in the right foot is resolved ooLeft lateral calf wound which was his original wounds still has 2 tiny open areas remaining this is just about closed ooNonviable surface on the left heel is better  but still requires debridement ooLeft first second toe/tinea pedis still open using silver alginate ooRight first second toe wound tinea pedis I asked him to go back to using ketoconazole and silver alginate ooLarge pressure ulcers on the left ischial tuberosity this shear injury here is resolved. Wound is smaller. No evidence of infection using silver alginate 04/18/18; ooPatient arrives with an intense area of cellulitis in the right mid lower calf extending into the right heel area. Bright red and warm. Smaller area on the left anterior leg. He has [name] significant history of MRSA. He will definitely need antibioticsoodoxycycline ooHe now has 2 open areas on the left ischial tuberosity the original large wound and now [name] satellite area which I think was above his initial satellite areas. Not [name] wonderful surface on this satellite area surrounding erythema which looks like pressure related. ooHis left lateral calf wound again his original wound is just about closed ooLeft heel pressure injury still requiring debridement ooLeft first second toe looks [name] lot better using silver alginate ooRight first second toe also using silver alginate and ketoconazole cream also looks better 04/20/18; the patient was worked in early today out of concerns with his cellulitis on the right leg. I had started him on doxycycline. This was 2 days ago. His wife was concerned about the swelling in the area. Also concerned about the left buttock. He has not been systemically unwell no fever chills. No nausea vomiting or diarrhea 04/25/18; the patient's left buttock wound is continued to deteriorate he is using Hydrofera Blue. He is still completing clindamycin for the cellulitis on the right leg although all of this looks better. 05/02/18 ooLeft buttock wound still with [name] lot of drainage and [name] very tightly adherent fibrinous necrotic surface. He has [name] deeper area superiorly ooThe left lateral calf wound is still  closed ooDTI wound on the left heel necrotic surface especially the circumference using Iodoflex ooAreas between his left first second toe and right first second toe both look better. Dorsally and the right first second toe he had [name] necrotic surface although at smaller. In using silver alginate and ketoconazole. I did [name] culture last week which was [name] deep tissue culture of the reminiscence of the open wound on the right first second toe dorsally. This grew [name] few Acinetobacter and [name] few methicillin-resistant staph aureus. Nevertheless the area actually this week looked better. I didn't feel the need to specifically address this at least in terms  of systemic antibiotics. 05/09/18; wounds are measuring larger more drainage per our intake. We are using Santyl covered with alginate on the large superficial buttock wounds, Iodosorb on the left heel, ketoconazole and silver alginate to the dorsal first and second toes bilaterally. 05/16/18; ooThe area on his left buttock better in some aspects although the area superiorly over the ischial tuberosity required an extensive debridement.using Santyl ooLeft heel appears stable. Using Iodoflex ooThe areas between his first and second toes are not bad however there is spreading erythema up the dorsal aspect of his left foot this looks like cellulitis again. He is insensate the erythema is really very brilliant.o Erysipelas Kauer, Andras [name] ([id number]) [id number] Page 24 of 36 He went to see an allergist days ago because he was itching part of this he had lab work done. This showed [name] white count of 15.1 with 70% neutrophils. Hemoglobin of 11.4 and [name] platelet count of 659,000. Last white count we had in Epic was [name] 2-1/2 years ago which was 25.9 but he was ill at the time. He was able to show me some lab work that was done by his primary physician the pattern is about the same. 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 But prompted me to go ahead and do x-rays of both feet and the pelvis rule out osteomyelitis. He also had [name] comprehensive metabolic panel this was reasonably normal his albumin was 3.7 liver function tests BUN/creatinine all normal 05/23/18; x-rays of both his feet from last week were negative for underlying pulmonary abnormality. The x-ray of his pelvis however showed mild irregularity in the left ischial which may represent some early osteomyelitis. The wound in the left ischial continues to get deeper clearly now exposed muscle. Each week necrotic surface material over this area. Whereas the rest of the wounds do not look so bad. ooThe left ischial wound we have been using Santyl and calcium alginate ooT the left heel surface necrotic debris using Iodoflex o ooThe left lateral leg is still healed ooAreas on the left dorsal foot and the right dorsal foot are about the same. There is some inflammation on the left which might represent contact dermatitis, fungal dermatitis I am doubtful cellulitis although this looks better than last week 05/30/18; CT scan done at Hospital did not show any osteomyelitis or abscess. Suggested the possibility of underlying cellulitis although I don't see [name] lot of evidence of this at the bedside ooThe wound itself on the left buttock/upper thigh actually looks somewhat better. No debridement ooLeft heel also looks better no debridement continue Iodoflex ooBoth dorsal first second toe spaces appear better using Lotrisone. Left still required debridement 06/06/18; ooIntake reported some purulent looking drainage from the left gluteal wound. Using Santyl and calcium alginate ooLeft heel looks better although still [name] nonviable surface requiring debridement ooThe left dorsal foot first/second webspace actually expanding and somewhat deeper. I may consider doing [name] shave biopsy of this area ooRight dorsal foot first/second webspace appears  stable to improved. Using Lotrisone and silver alginate to both these areas 06/13/18 ooLeft gluteal surface looks better. Now separated in the 2 wounds. No debridement required. Still drainage. We'll continue silver alginate ooLeft heel continues to look better with Iodoflex continue this for at least another week ooOf his dorsal foot wounds the area on the left still has some depth although it looks better than last week. We've been using Lotrisone and silver alginate 06/20/18 ooLeft gluteal  continues to look better healthy tissue ooLeft heel continues to look better healthy granulation wound is smaller. He is using Iodoflex and his long as this continues continue the Iodoflex ooDorsal right foot looks better unfortunately dorsal left foot does not. There is swelling and erythema of his forefoot. He had minor trauma to this several days ago but doesn't think this was enough to have caused any tissue injury. Foot looks like cellulitis, we have had this problem before 06/27/18 on evaluation today patient appears to be doing [name] little worse in regard to his foot ulcer. 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 Both of which would really be his only options for treating this infection. In the past he has been given and effusion of Orbactiv. This is done very well for him in the past again it's one time dosing IV antibiotic therapy. Subsequently I do believe this is something we're gonna need to see about doing at this point in time. Currently his other wounds seem to be doing somewhat better in my pinion I'm pretty happy in that regard. 07/03/18 on evaluation today patient's wounds actually appear to be doing fairly well. He has been tolerating the dressing changes without complication. All in all he seems to be showing signs of improvement. In regard to the  antibiotics he has been dealing with infectious disease since I saw him last week as far as getting this scheduled. In the end [facility]'s [facility] this coming Friday. In the meantime he has been continuing to perform the dressing changes in such as previous. There does not appear to be any evidence of infection worsengin at this time. 07/10/18; ooSince I last saw this man 2 [name] ago things have actually improved. IV antibiotics of resulted in less forefoot erythema although there is still some present. He is not systemically unwell ooLeft buttock wounds o2 now have no depth there is increased epithelialization Using silver alginate ooLeft heel still requires debridement using Iodoflex ooLeft dorsal foot still with [name] sizable wound about the size of [name] border but healthy granulation ooRight dorsal foot still with [name] slitlike area using silver alginate 07/18/18; the patient's cellulitis in the left foot is improved in fact I think it is on its way to resolving. ooLeft buttock wounds o2 both look better although the larger one has hypertension granulation we've been using silver alginate ooLeft heel has some thick circumferential redundant skin over the wound edge which will need to be removed today we've been using Iodoflex ooLeft dorsal foot is still [name] sizable wound required debridement using silver alginate ooThe right dorsal foot is just about closed only [name] small open area remains here 07/25/18; left foot cellulitis is resolved ooLeft buttock wounds o2 both look better. Hyper-granulation on the major area ooLeft heel as some debris over the surface but otherwise looks [name] healthier wound. Using silver collagen ooRight dorsal foot is just about closed 07/31/18; arrives with our intake nurse worried about purulent drainage from the buttock. We had hyper-granulation here last week ooHis buttock wounds o2 continue to look better ooLeft  heel some debris over the surface but measuring smaller. ooRight dorsal foot unfortunately has openings between the toes ooLeft foot superficial wound looks less aggravated. 08/07/18 ooButtock wounds continue to look better although some of her granulation  and the larger medial wound. silver alginate ooLeft heel continues to look [name] lot better.silver collagen ooLeft foot superficial wound looks less stable. Requires debridement. He has [name] new wound superficial area on the foot on the lateral dorsal foot. ooRight foot looks better using silver alginate without Lotrisone 08/14/2018; patient was in the ER last week diagnosed with [name] UTI. He is now on Cefpodoxime and Macrodantin. ooButtock wounds continued to be smaller. Using silver alginate ooLeft heel continues to look better using silver collagen ooLeft foot superficial wound looks as though it is improving ooRight dorsal foot area is just about healed. 08/21/2018; patient is completed his antibiotics for his UTI. ooHe has 2 open areas on the buttocks. There is still not closed although the surface looks satisfactory. Using silver alginate ooLeft heel continues to improve using silver collagen ooThe bilateral dorsal foot areas which are at the base of his first and second toes/possible tinea pedis are actually stable on the left but worse on the right. The area on the left required debridement of necrotic surface. After debridement I obtained [name] specimen for PCR culture. ooThe right dorsal foot which is been just about healed last week is now reopened 08/28/2018; culture done on the left dorsal foot showed coag negative staph both staph epidermidis and Lugdunensis. I think this is worthwhile initiating systemic treatment. I will use doxycycline given his long list of allergies. The area on the left heel slightly improved but still requiring debridement. ooThe large wound on the buttock is just about closed whereas the smaller one is  larger. Using silver alginate in this area 09/04/2018; patient is completing his doxycycline for the left foot although this continues to be [name] very difficult wound area with very adherent necrotic debris. We are using silver alginate to all his wounds right foot left foot and the small wounds on his buttock, silver collagen on the left heel. 09/11/2018; once again this patient has intense erythema and swelling of the left forefoot. Lesser degrees of erythema in the right foot. He has [name] long list of allergies and intolerances. I will reinstitute doxycycline. 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 Using silver alginate ooLeft heel also looks better using silver collagen Honse, Angel [name] ([id number]) [id number] Page 25 of 36 ooUnfortunately both the areas on his feet look worse. The area on the left first second webspace is now gone through to the plantar part of his foot. The area on the left foot anteriorly is irritated with erythema and swelling in the forefoot. 09/25/2018 ooHis wound on the left plantar heel looks better. Using silver collagen ooThe area on the left buttock 2 small remnant areas. One is closed one is still open. Using silver alginate ooThe areas between both his first and second toes look worse. This in spite of long-standing antifungal therapy with ketoconazole and silver alginate which should have antifungal activity ooHe has small areas around his original wound on the left calf one is on the bottom of the original scar tissue and one superiorly both of these are small and superficial but again given wound history in this site this is worrisome 10/02/2018 ooLeft plantar heel continues to gradually contract using silver collagen ooLeft buttock wound is unchanged using silver alginate ooThe areas on his dorsal feet between his first and second toes bilaterally look about the same. I  prescribed clindamycin ointment to see if we can address chronic staph colonization and also the  underlying possibility of erythrasma ooThe left lateral lower extremity wound is actually on the lateral part of his ankle. Small open area here. We have been using silver alginate 10/09/2018; ooLeft plantar heel continues to look healthy and contract. No debridement is required ooLeft buttock slightly smaller with [name] tape injury wound just below which was new this week ooDorsal feet somewhat improved I have been using clindamycin ooLeft lateral looks lower extremity the actual open area looks worse although [name] lot of this is epithelialized. I am going to change to silver collagen today He has [name] lot more swelling in the right leg although this is not pitting not red and not particularly warm there is [name] lot of spasm in the right leg usually indicative of people with paralysis of some underlying discomfort. We have reviewed his vascular status from 2017 he had [name] left greater saphenous vein ablation. I wonder about referring him back to vascular surgery if the area on the left leg continues to deteriorate. 10/16/2018 in today for follow-up and management of multiple lower extremity ulcers. His left Buttock wound is much lower smaller and almost closed completely. The wound to the left ankle has began to reopen with Epithelialization and some adherent slough. He has multiple new areas to the left foot and leg. The left dorsal foot without much improvement. Wound present between left great webspace and 2nd toe. Erythema and edema present right leg. Right [name] ultrasound obtained on 10/10/18 was negative for DVT . 10/23/2018; ooLeft buttock is closed over. Still dry macerated skin but there is no open wound. I suspect this is chronic pressure/moisture ooLeft lateral calf is quite [name] bit worse than when I saw this last. There is clearly drainage here he has macerated skin into the left plantar heel. We will  change the primary dressing to alginate ooLeft dorsal foot has some improvement in overall wound area. Still using clindamycin and silver alginate ooRight dorsal foot about the same as the left using clindamycin and silver alginate ooThe erythema in the right leg has resolved. He is DVT rule out was negative ooLeft heel pressure area required debridement although the wound is smaller and the surface is health 10/26/2018 ooThe patient came back in for his nurse check today predominantly because of the drainage coming out of the left lateral leg with [name] recent reopening of his original wound on the left lateral calf. He comes in today with [name] large amount of surrounding erythema around the wound extending from the calf into the ankle and even in the area on the dorsal foot. He is not systemically unwell. He is not febrile. Nevertheless this looks like cellulitis. We have been using silver alginate to the area. I changed him to [name] regular visit and I am going to prescribe him doxycycline. The rationale here is [name] long list of medication intolerances and [name] history of MRSA. I did not see anything that I thought would provide [name] valuable culture 10/30/2018 ooFollow-up from his appointment 4 days ago with really an extensive area of cellulitis in the left calf left lateral ankle and left dorsal foot. I put him on doxycycline. He has [name] long list of medication allergies which are true allergy reactions. 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 In any case he is [name] lot better today. The erythema especially in the anterior and lateral left calf is better. He still has left ankle erythema. He also is complaining  about increasing edema in the right leg we have only been using Kerlix Coban and he has been doing the wraps at home. Finally he has [name] spotty rash on the medial part of his upper left calf which looks like folliculitis or perhaps wrap  occlusion type injury. Small superficial macules not pustules 11/06/18 patient arrives today with again [name] considerable degree of erythema around the wound on the left lateral calf extending into the dorsal ankle and dorsal foot. This is [name] lot worse than when I saw this last week. He is on doxycycline really with not [name] lot of improvement. He has not been systemically unwell Wounds on the; left heel actually looks improved. Original area on the left foot and proximity to the first and second toes looks about the same. He has superficial areas on the dorsal foot, anterior calf and then the reopening of his original wound on the left lateral calf which looks about the same ooThe only area he has on the right is the dorsal webspace first and second which is smaller. ooHe has [name] large area of dry erythematous skin on the left buttock small open area here. 11/13/2018; the patient arrives in much better condition. The erythema around the wound on the left lateral calf is [name] lot better. Not sure whether this was the clindamycin or the TCA and ketoconazole or just in the improvement in edema control [stasis dermatitis]. In any case this is [name] lot better. The area on the left heel is very small and just about resolved using silver collagen we have been using silver alginate to the areas on his dorsal feet 11/20/2018; his wounds include the left lateral calf, left heel, dorsal aspects of both feet just proximal to the first second webspace. He is stable to slightly improved. I did not think any changes to his dressings were going to be necessary 11/27/2018 he has [name] reopening on the left buttock which is surrounded by what looks like tinea or perhaps some other form of dermatitis. The area on the left dorsal foot has some erythema around it I have marked this area but I am not sure whether this is cellulitis or not. Left heel is not closed. Left calf the reopening is really slightly longer and probably worse 1/13; in  general things look better and smaller except for the left dorsal foot. Area on the left heel is just about closed, left buttock looks better only [name] small wound remains in the skin looks better [using Lotrisone] 1/20; the area on the left heel only has [name] few remaining open areas here. Left lateral calf about the same in terms of size, left dorsal foot slightly larger right lateral foot still not closed. The area on the left buttock has no open wound and the surrounding skin looks [name] lot better 1/27; the area on the left heel is closed. Left lateral calf better but still requiring extensive debridements. The area on his left buttock is closed. He still has the open areas on the left dorsal foot which is slightly smaller in the right foot which is slightly expanded. We have been using Iodoflex on these areas as well 2/3; left heel is closed. Left lateral calf still requiring debridement using Iodoflex there is no open area on his left buttock however he has dry scaly skin over [name] large area of this. Not really responding well to the Lotrisone. Finally the areas on his dorsal feet at the level of the first second webspace are  slightly smaller on the right and about the same on the left. Both of these vigorously debrided with Anasept and gauze 2/10; left heel remains closed he has dry erythematous skin over the left buttock but there is no open wound here. Left lateral leg has come in and with. Still requiring debridement we have been using Iodoflex here. Finally the area on the left dorsal foot and right dorsal foot are really about the same extremely dry callused fissured areas. He does not yet have [name] dermatology appointment 2/17; left heel remains closed. He has [name] new open area on the left buttock. The area on the left lateral calf is bigger longer and still covered in necrotic debris. No major change in his foot areas bilaterally. I am awaiting for [name] dermatologist to look on this. We have been using  ketoconazole I do not know that this is been doing any good at all. 2/24; left heel remains closed. The left buttock wound that was new reopening last week looks better. The left lateral calf appears better also although still requires debridement. The major area on his foot is the left first second also requiring debridement. We have been putting Prisma on all wounds. I do not believe that the ketoconazole has done too much good for his feet. He will use Lotrisone I am going to give him [name] 2-week course of terbinafine. We still do not have [name] dermatology appointment 3/2 left heel remains closed however there is skin over bone in this area I pointed this out to him today. The left buttock wound is epithelialized but still does not look completely stable. The area on the left leg required debridement were using silver collagen here. With regards to his feet we changed to Lotrisone last week and silver alginate. 3/9; left heel remains closed. Left buttock remains closed. The area on the right foot is essentially closed. The left foot remains unchanged. Slightly smaller on the left lateral calf. Using silver collagen to both of these areas Motyka, Alderson ([id number]) [id number] Page 26 of 36 3/16-Left heel remains closed. Area on right foot is closed. Left lateral calf above the lateral malleolus open wound requiring debridement with easy bleeding. Left dorsal wound proximal to first toe also debrided. Left ischial area open new. Patient has been using Prisma with wrapping every 3 days. Dermatology appointment is apparently tomorrow.Patient has completed his terbinafine 2-week course with some apparent improvement according to him, there is still flaking and dry skin in his foot on the left 3/23; area on the right foot is reopened. The area on the left anterior foot is about the same still [name] very necrotic adherent surface. He still has the area on the left leg and reopening is  on the left buttock. He apparently saw dermatology although I do not have [name] note. According to the patient who is usually fairly well informed they did not have any good ideas. Put him on oral terbinafine which she is been on before. 3/30; using silver collagen to all wounds. Apparently his dermatologist put him on doxycycline and rifampin presumably some culture grew staph. I do not have this result. He remains on terbinafine although I have used terbinafine on him before 4/6; patient has had [name] fairly substantial reopening on the right foot between the first and second toes. He is finished his terbinafine and I believe is on doxycycline and rifampin still as prescribed by dermatology. We have been using silver collagen to all his wounds although the  patient reports that he thinks silver alginate does better on the wounds on his buttock. 4/13; the area on his left lateral calf about the same size but it did not require debridement. ooLeft dorsal foot just proximal to the webspace between the first and second toes is about the same. Still nonviable surface. I note some superficial bronze discoloration of the dorsal part of his foot ooRight dorsal foot just proximal to the first and second toes also looks about the same. I still think there may be the same discoloration I noted above on the left ooLeft buttock wound looks about the same 4/20; left lateral calf appears to be gradually contracting using silver collagen. 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 The left dorsal foot wound is debrided of tightly adherent necrotic debris and really cleans up quite nicely. The right area is worse with expansion. I did not debride this it is now over the base of the second toe ooThe area on his left buttock is smaller no debridement is required using silver collagen 5/4; left calf continues to make good progress. ooHe arrives with erythema  around the wounds on his dorsal foot which even extends to the plantar aspect. Very concerning for coexistent infection. He is finished the erythromycin I gave him for possible erythrasma this does not seem to have helped. ooThe area on the left foot is about the same base of the dorsal toes ooIs area on the buttock looks improved on the left 5/11; left calf and left buttock continued to make good progress. Left foot is about the same to slightly improved. ooMajor problem is on the right foot. He has not had an x-ray. Deep tissue culture I did last week showed both Enterobacter and [name]. coli. I did not change the doxycycline I put him on empirically although neither 1 of these were plated to doxycycline. He arrives today with the erythema looking worse on both the dorsal and plantar foot. Macerated skin on the bottom of the foot. he has not been systemically unwell 5/18-Patient returns at 1 week, left calf wound appears to be making some progress, left buttock wound appears slightly worse than last time, left foot wound looks slightly better, right foot redness is marginally better. X-ray of both feet show no air or evidence of osteomyelitis. Patient is finished his Omnicef and terbinafine. He continues to have macerated skin on the bottom of the left foot as well as right 5/26; left calf wound is better, left buttock wound appears to have multiple small superficial open areas with surrounding macerated skin. X-rays that I did last time showed no evidence of osteomyelitis in either foot. He is finished cefdinir and doxycycline. I do not think that he was on terbinafine. He continues to have [name] large superficial open area on the right foot anterior dorsal and slightly between the first and second toes. I did send him to dermatology 2 months ago or so wondering about whether they would do [name] fungal scraping. I do not believe they did but did do [name] culture. We have been using silver alginate to the  toe areas, he has been using antifungals at home topically either ketoconazole or Lotrisone. We are using silver collagen on the left foot, silver alginate on the right, silver collagen on the left lateral leg and silver alginate on the left buttock 6/1; left buttock area is healed. We have the left dorsal foot, left lateral leg and right dorsal foot. We  are using silver alginate to the areas on both feet and silver collagen to the area on his left lateral calf 6/8; the left buttock apparently reopened late last week. He is not really sure how this happened. He is tolerating the terbinafine. Using silver alginate to all wounds 6/15; left buttock wound is larger than last week but still superficial. ooCame in the clinic today with [name] report of purulence from the left lateral leg I did not identify any infection ooBoth areas on his dorsal feet appear to be better. He is tolerating the terbinafine. Using silver alginate to all wounds 6/22; left buttock is about the same this week, left calf quite [name] bit better. His left foot is about the same however he comes in with erythema and warmth in the right forefoot once again. Culture that I gave him in the beginning of May showed Enterobacter and [name]. coli. I gave him doxycycline and things seem to improve although neither 1 of these organisms was specifically plated. 6/29; left buttock is larger and dry this week. Left lateral calf looks to me to be improved. Left dorsal foot also somewhat improved right foot completely unchanged. The erythema on the right foot is still present. He is completing the Ceftin dinner that I gave him empirically [see discussion above.) 7/6 - All wounds look to be stable and perhaps improved, the left buttock wound is slightly smaller, per patient bleeds easily, completed ceftin, the right foot redness is less, he is on terbinafine 7/13; left buttock wound about the same perhaps slightly narrower. Area on the left lateral leg  continues to narrow. Left dorsal foot slightly smaller right foot about the same. We are using silver alginate on the right foot and Hydrofera Blue to the areas on the left. Unna boot on the left 2 layer compression on the right 7/20; left buttock wound absolutely the same. Area on lateral leg continues to get better. Left dorsal foot require debridement as did the right no major change in the 7/27; left buttock wound the same size necrotic debris over the surface. The area on the lateral leg is closed once again. His left foot looks better right foot about the same although there is some involvement now of the posterior first second toe area. He is still on terbinafine which I have given him for [name] month, not certain [name] centimeter major change 06/25/19-All wounds appear to be slightly improved according to report, left buttock wound looks clean, both foot wounds have minimal to no debris the right dorsal foot has minimal slough. We are using Hydrofera Blue to the left and silver alginate to the right foot and ischial wound. 8/10-Wounds all appear to be around the same, the right forefoot distal part has some redness which was not there before, however the wound looks clean and small. Ischial wound looks about the same with no changes 8/17; his wound on the left lateral calf which was his original chronic venous insufficiency wound remains closed. Since I last saw him the areas on the left dorsal foot right dorsal foot generally appear better but require debridement. The area on his left initial tuberosity appears somewhat larger to me perhaps hyper granulated and bleeds very easily. We have been using Hydrofera Blue to the left dorsal foot and silver alginate to everything else 8/24; left lateral calf remains closed. The areas on his dorsal feet on the webspace of the first and second toes bilaterally both look better. The area on the left buttock  which is the pressure ulcer stage II slightly smaller. I  change the dressing to Hydrofera Blue to all areas 8/31; left lateral calf remains closed. The area on his dorsal feet bilaterally look better. Using Hydrofera Blue. Still requiring debridement on the left foot. No change in the left buttock pressure ulcers however 9/14; left lateral calf remains closed. Dorsal feet look quite [name] bit better than 2 [name] ago. Flaking dry skin also [name] lot better with the ammonium lactate I gave him 2 [name] ago. The area on the left buttock is improved. He states that his Roho cushion developed [name] leak and he is getting [name] new one, in the interim he is offloading this vigorously 9/21; left calf remains closed. Left heel which was [name] possible DTI looks better this week. He had macerated tissue around the left dorsal foot right foot looks satisfactory and improved left buttock wound. I changed his dressings to his feet to silver alginate bilaterally. Continuing Hydrofera Blue on the left buttock. 9/28 left calf remains closed. Left heel did not develop anything [possible DTI] dry flaking skin on the left dorsal foot. Right foot looks satisfactory. Improved left buttock wound. We are using silver alginate on his feet Hydrofera Blue on the buttock. I have asked him to go back to the Lotrisone on his feet including the wounds and surrounding areas 10/5; left calf remains closed. The areas on the left and right feet about the same. [name] lot of this is epithelialized however debris over the remaining open areas. He is using Lotrisone and silver alginate. The area on the left buttock using Hydrofera Blue 10/26. Patient has been out for 3 [name] secondary to Covid concerns. He tested negative but I think his wife tested positive. He comes in today with the left foot substantially worse, right foot about the same. Even more concerning he states that the area on his left buttock closed over but then reopened and is considerably deeper in one aspect than it was before [stage III  wound] Karow, [name] ([id number]) [id number] Page 27 of 36 11/2; left foot really about the same as last week. Quarter sized wound on the dorsal foot just proximal to the first second toes. Surrounding erythema with areas of denuded epithelium. This is not really much different looking. Did not look like cellulitis this time however. ooRight foot area about the same.. We have been using silver alginate alginate on his toes ooLeft buttock still substantial irritated skin around the wound which I think looks somewhat better. We have been using Hydrofera Blue here. 11/9; left foot larger than last week and [name] very necrotic surface. Right foot I think is about the same perhaps slightly smaller. Debris around the circumference also addressed. Unfortunately on the left buttock there is been [name] decline. Satellite lesions below the major wound distally and now [name] an additional one posteriorly we have been using Hydrofera Blue but I think this is [name] pressure issue 11/16; left foot ulcer dorsally again [name] very adherent necrotic surface. Right foot is about the same. Not much change in the pressure ulcer on his left buttock. 11/30; left foot ulcer dorsally basically the same as when I saw him 2 [name] ago. Very adherent fibrinous debris on the wound surface. Patient reports [name] lot of drainage as well. 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 We have been using Iodoflex, patient changed back to alginate because of the drainage. Area on his right dorsal  foot really looks benign with [name] healthier surface certainly [name] lot better than on the left. Left buttock wounds all improved using Hydrofera Blue 12/7; left dorsal foot again no improvement. Tightly adherent debris. PCR culture I did last week only showed likely skin contaminant. I have gone ahead and done [name] punch biopsy of this which is about the last thing in terms of investigations I can  think to do. He has known venous insufficiency and venous hypertension and this could be the issue here. The area on the right foot is about the same left buttock slightly worse according to our intake nurse secondary to [facility] Blue sticking to the wound 12/14; biopsy of the left foot that I did last time showed changes that could be related to wound healing/chronic stasis dermatitis phenomenon no neoplasm. We have been using silver alginate to both feet. I change the one on the left today to Sorbact and silver alginate to his other 2 wounds 12/28; the patient arrives with the following problems; ooMajor issue is the dorsal left foot which continues to be [name] larger deeper wound area. Still with [name] completely nonviable surface ooParadoxically the area mirror image on the right on the right dorsal foot appears to be getting better. ooHe had some loss of dry denuded skin from the lower part of his original wound on the left lateral calf. Some of this area looked [name] little vulnerable and for this reason we put him in wrap that on this side this week ooThe area on his left buttock is larger. He still has the erythematous circular area which I think is [name] combination of pressure, sweat. This does not look like cellulitis or fungal dermatitis 11/26/2019; -Dorsal left foot large open wound with depth. Still debris over the surface. Using Sorbact ooThe area on the dorsal right foot paradoxically has closed over [facility] has [name] reopening on the left ankle laterally at the base of his original wound that extended up into the calf. This appears clean. ooThe left buttock wound is smaller but with very adherent necrotic debris over the surface. We have been using silver alginate here as well The patient had arterial studies done in 2017. He had biphasic waveforms at the dorsalis pedis and posterior tibial bilaterally. ABI in the left was 1.17. Digit waveforms were dampened. He has slight spasticity in the great  toes I do not think [name] TBI would be possible 1/11; the patient comes in today with [name] sizable reopening between the first and second toes on the right. This is not exactly in the same location where we have been treating wounds previously. According to our intake nurse this was actually fairly deep but 0.6 cm. The area on the left dorsal foot looks about the same the surface is somewhat cleaner using Sorbact, his MRI is in 2 days. We have not managed yet to get arterial studies. The new reopening on the left lateral calf looks somewhat better using alginate. The left buttock wound is about the same using alginate 1/18; the patient had his ARTERIAL studies which were quite normal. ABI in the right at 1.13 with triphasic/biphasic waveforms on the left ABI 1.06 again with triphasic/biphasic waveforms. It would not have been possible to have done [name] toe brachial index because of spasticity. We have been using Sorbac to the left foot alginate to the rest of his wounds on the right foot left lateral calf and left buttock 1/25; arrives in clinic with erythema and swelling of the left  forefoot worse over the first MTP area. This extends laterally dorsally and but also posteriorly. Still has an area on the left lateral part of the lower part of his calf wound it is eschared and clearly not closed. ooArea on the left buttock still with surrounding irritation and erythema. ooRight foot surface wound dorsally. The area between the right and first and second toes appears better. 2/1; ooThe left foot wound is about the same. Erythema slightly better I gave him [name] week of doxycycline empirically ooRight foot wound is more extensive extending between the toes to the plantar surface ooLeft lateral calf really no open surface on the inferior part of his original wound however the entire area still looks vulnerable ooAbsolutely no improvement in the left buttock wound required debridement. 2/8; the left foot is  about the same. Erythema is slightly improved I gave him clindamycin last week. ooRight foot looks better he is using Lotrimin and silver alginate ooHe has [name] breakdown in the left lateral calf. Denuded epithelium which I have removed ooLeft buttock about the same were using Hydrofera Blue 2/15; left foot is about the same there is less surrounding erythema. Surface still has tightly adherent debris which I have debriding however not making any progress ooRight foot has [name] substantial wound on the medial right second toe between the first and second webspace. ooStill an open area on the left lateral calf distal area. ooButtock wound is about the same 2/22; left foot is about the same less surrounding erythema. Surface has adherent debris. Polymen Ag Right foot area significant wound between the first and second toes. We have been using silver alginate here Left lateral leg polymen Ag at the base of his original venous insufficiency wound ooLeft buttock some improvement here 3/1; ooRight foot is deteriorating in the first second toe webspace. Larger and more substantial. We have been using silver alginate. ooLeft dorsal foot about the same markedly adherent surface debris using PolyMem Ag ooLeft lateral calf surface debris using PolyMem AG ooLeft buttock is improved again using PolyMem Ag. ooHe is completing his terbinafine. The erythema in the foot seems better. He has been on this for 2 [name] 3/8; no improvement in any wound area in fact he has [name] small open area on the dorsal midfoot which is new this week. He has not gotten his foot x-rays yet 3/15; his x-rays were both negative for osteomyelitis of both feet. No major change in any of his wounds on the extremities however his buttock wounds are better. We have been using polymen on the buttocks, left lower leg. Iodoflex on the left foot and silver alginate on the right 3/22; arrives in clinic today with the 2 major issues are the  improvement in the left dorsal foot wound which for once actually looks healthy with [name] nice healthy wound surface without debridement. Using Iodoflex here. Unfortunately on the left lateral calf which is in the distal part of his original wound he came to the clinic here for there was purulent drainage noted some increased breakdown scattered around the original area and [name] small area proximally. We we are using polymen here will change to silver alginate today. His buttock wound on the left is better and I think the area on the right first second toe webspace is also improved 3/29; left dorsal foot looks better. Using Iodoflex. Left ankle culture from deterioration last time grew [name]. coli, Enterobacter and Enterococcus. I will give him [name] course of cefdinir although that will not  cover Enterococcus. The area on the right foot in the webspace of the first and second toe lateral first toe looks better. The area on his buttock is about healed Vascular appointment is on April 21. This is to look at his venous system vis--vis continued breakdown of the wounds on the left including the left lateral leg and left dorsal foot he. He has had previous ablations on this side 4/5; the area between the right first and second toes lateral aspect of the first toe looks better. Dorsal aspect of the left first toe on the left foot also improved. Unfortunately the left lateral lower leg is larger and there is [name] second satellite wound superiorly. The usual superficial abrasions on the left buttock overall better but certainly not closed 4/12; the area between the right first and second toes is improved. Dorsal aspect of the left foot also slightly smaller with [name] vibrant healthy looking surface. No real change in the left lateral leg and the left buttock wound is healed Frysinger, Elgar [name] ([id number]) [id number] Page 28 of 36 He has an unaffordable co-pay for Apligraf. Appointment with vein and  vascular with regards to the left leg venous part of the circulation is on 4/21 4/19; we continue to see improvement in all wound areas. Although this is minor. He has his vascular appointment on 4/21. The area on the left buttock has not reopened although right in the center of this area the skin looks somewhat threatened 4/26; the left buttock is unfortunately reopened. In general his left dorsal foot has [name] healthy surface and looks somewhat smaller although it was not measured as such. The area between his first and second toe webspace on the right as [name] small wound against the first toe. The patient saw vascular surgery. The real question I was asking was about the small saphenous vein on the left. He has previously ablated left greater saphenous vein. Nothing further was commented on on the left. Right greater saphenous vein without reflux at the saphenofemoral junction or proximal thigh there was no indication for ablation of the right greater saphenous vein duplex was negative for DVT bilaterally. They did not think there was anything from [name] vascular surgery point of view that could be offered. They ABIs within normal limits 5/3; only small open area on the left buttock. The area on the left lateral leg which was his original venous reflux is now 2 wounds both which look clean. We are using Iodoflex on the left dorsal foot which looks healthy and smaller. He is down to [name] very tiny area between the right first and second toes, using silver alginate 5/10; all of his wounds appear better. We have much better edema control in 4 layer compression on the left. This may be the factor that is allowing the left foot and left lateral calf to heal. He has external compression garments at home 04/14/20-All of his wounds are progressing well, the left forefoot is practically closed, left ischium appears to be about the same, right toe webspace is also smaller. The left lateral leg is about the same, continue  using Hydrofera Blue to this, silver alginate to the ischium, Iodoflex to the toe space on the right 6/7; most of his wounds outside of the left buttock are doing well. The area on the left lateral calf and left dorsal foot are smaller. The area on the right foot in between the first and second toe webspace is barely visible although he still says there  is some drainage here is the only reason I did not heal this out. ooUnfortunately the area on the left buttock almost looks like he has [name] skin tear from tape. He has open wound and then [name] large flap of skin that we are trying to get adherence over an area just next to the remaining wound 6/21; 2 week follow-up. I believe is been here for nurse visits. Miraculously the area between his first and second toes on the left dorsal foot is closed over. Still open on the right first second web space. The left lateral calf has 2 open areas. Distally this is more superficial. The proximal area had [name] little more depth and required debridement of adherent necrotic material. His buttock wound is actually larger we have been using silver alginate here 6/28; the patient's area on the left foot remains closed. Still open wet area between the first and second toes on the right and also extending into the plantar aspect. We have been using silver alginate in this location. He has 2 areas on the left lower leg part of his original long wounds which I think are better. We have been using Hydrofera Blue here. Hydrofera Blue to the left buttock which is stable 7/12; left foot remains closed. Left ankle is closed. May be [name] small area between his right first and second toes the only truly open area is on the left buttock. We have been using Hydrofera Blue here 7/19; patient arrives with marked deterioration especially in the left foot and ankle. We did not put him in [name] compression wrap on the left last week in fact he wore his juxta lite stockings on either side although he  does not have an underlying stocking. He has [name] reopening on the left dorsal foot, left lateral ankle and [name] new area on the right dorsal ankle. More worrisome is the degree of erythema on the left foot extending on the lateral foot into the lateral lower leg on the left 7/26; the patient had erythema and drainage from the lateral left ankle last week. Culture of this grew MRSA resistant to doxycycline and clindamycin which are the 2 antibiotics we usually use with this patient who has multiple antibiotic allergies including linezolid, trimethoprim sulfamethoxazole. I had give him an empiric doxycycline and he comes in the area certainly looks somewhat better although it is blotchy in his lower leg. He has not been systemically unwell. He has had areas on the left dorsal foot which is [name] reopening, chronic wounds on the left lateral ankle. Both of these I think are secondary to chronic venous insufficiency. The area between his first and second toes is closed as far as I can tell. He had [name] new wrap injury on the right dorsal ankle last week. Finally he has an area on the left buttock. We have been using silver alginate to everything except the left buttock we are using Hydrofera Blue 06/30/20-Patient returns at 1 week, has been given [name] sample dose pack of NUZYRA which is [name] tetracycline derivative [omadacycline], patient has completed those, we have been using silver alginate to almost all the wounds except the left ischium where we are using Hydrofera Blue all of them look better 8/16; since I last saw the patient he has been doing well. The area on the left buttock, left lateral ankle and left foot are all closed today. He has completed the [place] I gave him last time and tolerated this well. He still has open areas  on the right dorsal ankle and in the right first second toe area which we are using silver alginate. 8/23; we put him in his bilateral external compression stockings last week as he did not have  anything open on either leg except for concerning area between the right first and second toe. He comes in today with an area on the left dorsal foot slightly more proximal than the original wound, the left lateral foot but this is actually [name] continuation of the area he had on the left lateral ankle from last time. As well he is opened up on the left buttock again. 8/30; comes in today with things looking [name] lot better. The area on the left lower ankle has closed down as has the left foot but with eschar in both areas. The area on the dorsal right ankle is also epithelialized. Very little remaining of the left buttock wound. We have been using silver alginate on all wound areas 9/13; the area in the first second toe webspace on the right has fully epithelialized. He still has some vulnerable epithelium on the right and the ankle and the dorsal foot. He notes weeping. He is using his juxta lite stocking. On the left again the left dorsal foot is closed left lateral ankle is closed. We went to the juxta lite stocking here as well. ooStill vulnerable in the left buttock although only 2 small open areas remain here 9/27; 2-week follow-up. We did not look at his left leg but the patient says everything is closed. He is [name] bit disturbed by the amount of edema in his left foot he is using juxta lite stockings but asking about over the toes stockings which would be 30/40, will talk to him next time. According to him there is no open wound on either the left foot or the left ankle/calf He has an open area on the dorsal right calf which I initially point [name] wrap injury. He has superficial remaining wound on the left ischial tuberosity been using silver alginate although he says this sticks to the wound 10/5; we gave him 2-week follow-up but he called yesterday expressing some concerns about his right foot right ankle and the left buttock. He came in early. There is still no open areas on the left leg and that  still in his juxta lite stocking 10/11; he only has 1 small area on the left buttock that remains measuring millimeters 1 mm. Still has the same irritated skin in this area. We recommended zinc oxide when this eventually closes and pressure relief is meticulously is he can do this. He still has an area on the dorsal part of his right first through third toes which is [name] bit irritated and still open and on the dorsal ankle near the crease of the ankle. We have been using silver alginate and using his own stocking. He has nothing open on the left leg or foot 10/25; 2-week follow-up. Not nearly as good on the left buttock as I was hoping. For open areas with 5 looking threatened small. He has the erythematous irritated chronic skin in this area. oo1 area on the right dorsal ankle. He reports this area bleeds easily ooRight dorsal foot just proximal to the base of his toes ooWe have been using silver alginate. 11/8; 2-week follow-up. Left buttock is about the same although I do not think the wounds are in the same location we have been using silver alginate. I have asked him to use zinc oxide  on the skin around the wounds. ooHe still has [name] small area on the right dorsal ankle he reports this bleeds easily ooRight dorsal foot just proximal to the base of the toes does not have anything open although the skin is very dry and scaly ooHe has [name] new opening on the nailbed of the left great toe. Nothing on the left ankle 11/29; 3-week follow-up. Left buttock has 2 open areas. And washing of these wounds today started bleeding easily. Suggesting very friable tissue. We have been using silver alginate. Right dorsal ankle which I thought was initially [name] wrap injury we have been using silver alginate. Nothing open between the toes that I can see. He states the area on the left dorsal toe nailbed healed after the last visit in 2 or 3 days 12/13; 3-week follow-up. His left buttock now has 3 open areas but the  original 2 areas are smaller using polymen here. Surrounding skin looks better. The right dorsal ankle is closed. He has [name] small opening on the right dorsal foot at the level of the third toe. In general the skin looks better here. He is wearing his juxta lite stocking on the left leg says there is nothing open 11/24/2020; 3 [name] follow-up. His left buttock still has the 3 open areas. We have been using polymen but due to lack of response he changed to [facility] area. Surrounding skin is dry erythematous and irritated looking. There is no evidence of infection either bacterial or fungal however there is loss of surface epithelium ooHe still has very dry skin in his foot causing irritation and erythema on the dorsal part of his toes. This is not responded to prolonged courses of antifungal simply looks dry and irritated 1/24; left buttock area still looks about the same he was unable to find the triad ointment that we had suggested. The area on the right lower leg just above the dorsal ankle has reopened and the areas on the right foot between the first second and second third toes and scaling on the bottom of the foot has been about the same for quite some time now. been using silver alginate to all wound areas 2/7; left buttock wound looked quite good although not much smaller in terms of surface area surrounding skin looks better. Only [name] few dry flaking areas on the right foot in between the first and second toes the skin generally looks better here [ammonium lactate]. Finally the area on the right dorsal ankle is closed Reckart, [place] [name] ([id number]) [id number] Page 29 of 36 2/21; ooThere is no open area on the right foot even between the right first and second toe. Skin around this area dorsally and plantar aspects look better. ooHe has [name] reopening of the area on the right ankle just above the crease of the ankle dorsally. I continue to think that this is probably  friction from spasms may be even this time with his stocking under the compression stockings. ooWounds on his left buttock look about the same there [name] couple of areas that have reopened. He has [name] total square area of loss of epithelialization. This does not look like infection it looks like [name] contact dermatitis but I just cannot determine to what 3/14; there is nothing on the right foot between the first and second toes this was carefully inspected under illumination. Some chronic irritation on the dorsal part of his foot from toes 1-3 at the base. Nothing really open here substantially. Still has an  area on the right foot/ankle that is actually larger and hyper granulated. His buttock area on the left is just about closed however he has chronic inflammation with loss of the surface epithelial layer 3/28; 2-week follow-up. In clinic today with [name] new wound on the left anterior mid tibia. Says this happened about 2 [name] ago. He is not really sure how wonders about the spasticity of his legs at night whether that could have caused this other than that he does not have [name] good idea. He has been using topical antibiotics and silver alginate. The area on his right dorsal ankle seems somewhat better. ooFinally everything on his left buttock is closed. 4/11; 2-week follow-up. All of his wounds are better except for the area over the ischium and left buttock which have opened up widely again. At least part of this is covered in necrotic fibrinous material another part had rolled nonviable skin. The area on the right ankle, left anterior mid tibia are both [name] lot better. He had no open wounds on either foot including the areas between the first and second toes 4/25; patient presents for 2-week follow-up. He states that the wounds are overall stable. He has no complaints today and states he is using Hydrofera Blue to open wounds. 5/9; have not seen this man in over [name] month. For my memory he has open areas on  the left mid tibia and right ankle. T oday he has new open area on the right dorsal foot which we have not had [name] problem with recently. He has the sustained area on the left buttock He is also changed his insurance at the beginning of the year [employer]. We will need prior authorizations for debridement 5/23; patient presents for 2-week follow-up. He has prior authorizations for debridement. He denies any issues in the past 2 [name] with his wound care. He has been using Hydrofera Blue to all the wounds. He does report [name] circular rash to the upper left leg that is new. He denies acute signs of infection. 6/6; 2-week follow-up. The patient has open wounds on the left buttock which are worse than the last time I saw this about [name] month ago. He also has [name] new area to me on the left anterior mid tibia with some surrounding erythema. The area on the dorsal ankle on the right is closed but I think this will be [name] friction injury every time this area is exposed to either our wraps or his compression stockings caused by unrelenting spasms in this leg. 6/20; 2-week follow-up. oo The patient has open wounds on the left buttock which is about the same. Using [facility] here. - The left mid tibia has [name] static amount of surrounding erythema. Also [name] raised area in the center. We have been using Hydrofera Blue here. ooooFinally he has broken down in his dorsal right foot extending between the first and second toes and going to the base of the first and second toe webspace. I have previously assumed that this was severe venous hypertension 7/5; 2-week follow-up oo The left buttock wound actually looks better. We are using Hydrofera Blue. He has extensive skin irritation around this area and I have not really been able to get that any better. I have tried Lotrisone i.[name]. antifungals and steroids. More most recently we have just been using Coloplast really looks about the same. oo The left mid tibia which was  new last week culture to have very resistant staph aureus. Not only  methicillin-resistant but doxycycline resistant. The patient has [name] plethora of antibiotic allergies including sulfa, linezolid. I used topical bacitracin on this but he has not started this yet. oo In addition he has an expanding area of erythema with [name] wound on the dorsal right foot. I did [name] deep tissue culture of this area today 7/12; oo Left buttock area actually looks better surrounding skin also looks less irritated. oo Left mid tibia looks about the same. He is using bacitracin this is not worse oo Right dorsal foot looks about the same as well. oo The left first toe also looks about the same 7/19; left buttock wound continues to improve in terms of open areas oo Left mid tibia is still concerning amount of swelling he is using bacitracin oo Dorsal left first toe somewhat smaller oo Right dorsal foot somewhat smaller 7/25; left buttock wound actually continues to improve oo Left mid tibia area has less swelling. I gave him all my samples of new Nuzyra. This seems to have helped although the wound is still open it. His abrasion closed by here oo Left dorsal great toe really no better. Still [name] very nonviable surface oo Right dorsal foot perhaps some better. oo We have been using bacitracin and silver nitrate to the areas on his lower legs and Hydrofera Blue to the area on the buttock. 8/16 oo Disappointed that his left buttock wound is actually more substantial. Apparently during the last nurse visit these were both very small. He has continued irritation to [name] large area of skin on his buttock. I have never been able to totally explain this although I think it some combination of the way he sits, pressure, moisture. He is not incontinent enough to contribute to this. oo Left dorsal great toe still fibrinous debris on the surface that I have debrided today oo Large area across the dorsal right toes. oo The  area on the left anterior mid tibia has less swelling. He completed the [place]. This does not look infected although the tissue is still fried 8/30; 2-week follow-up. oo Left buttock areas not improved. We used Hydrofera Blue on this. Weeping wet with the surrounding erythema that I have not been able to control even with Lotrisone and topical Coloplast oo Left dorsal great toe looks about the same oo More substantial area again at the base of his toes on the left which is new this week. oo Area across the dorsal right toes looks improved oo The left anterior mid tibia looks like it is trying to close 9/13; 2-week follow-up. Using silver alginate on all of his wounds. The left dorsal foot does not look any better. He has the area on the dorsal toe and also the areas at the base of all of his toes 1 through 3. On the right foot he has [name] similar pattern in [name] similar area. He has the area on his left mid tibia that looks fairly healthy. Finally the area on his left buttock looks somewhat bette 9/20; culture I did of the left foot which was [name] deep tissue culture last time showed [name]. coli he has erythema around this wound. Still [name] completely necrotic surface. His right dorsal foot looks about the same. He has [name] very friable surface to the left anterior mid tibia. Both buttock wounds look better. We have been using silver alginate to all wounds 10/4; he has completed the cephalexin that I gave him last time for the left foot. He is using topical gentamicin  under silver alginate silver alginate being applied to all the wounds. Unfortunately all the wounds look irritated on his dorsal right foot dorsal left foot left mid tibia. I wonder if this could be [name] silver allergy. I am going to change him to [facility] on the lower extremity. The skin on the left buttock and left posterior thigh still flaking dry and irritated. This has continued no matter what I have applied topically to this. He has  [name] solitary open wound which by itself does not look too bad however the entire area of surrounding skin does not change no matter what we have applied here 10/18; the area on the left dorsal foot and right dorsal foot both look better. The area on the right extends into the plantar but not between his toes. We have been using silver alginate. He still has [name] rectangular erythematous area around the area on the left tibia. The wound itself is very small. Finally everything on his left buttock looks [name] little larger the skin is erythematous 11/15; patient comes in with the left dorsal and right dorsal foot distally looking somewhat better. Still nonviable surface on the left foot which required debridement. He still has the area on the left anterior mid tibia although this looks somewhat better. He has [name] new area on the right lateral lower leg just above the ankle. Finally his left buttock looks terrible with multiple superficial open wounds geometric square shaped area of chronic erythema which I have not been able to sort through 11/29; right dorsal foot and left dorsal foot both look somewhat better. No debridement required. He has the fragile area on the left anterior mid tibia this looks Dobratz, Master [name] ([id number]) [id number] Page 30 of 36 and continues to look somewhat better. Right lateral lower leg just above the ankle we identified last time also looks better. In general the area on his left buttock looks improved. We are using Hydrofera Blue to all wound areas 12/13; right dorsal foot looks better. The area on the right lateral leg is healed. Left dorsal foot has 2 open areas both of which require debridement. The fragile area on the left anterior mid tibial looks better. Smaller area on his buttocks. Were using Hydrofera Blue 1/10; patient comes in with everything looking slightly larger and/or worse. This includes his left buttock, reopening of the left mid tibia,  larger areas on the left dorsal foot and what looks to be [name] cellulitis on the right dorsal and plantar foot. We have been using Hydrofera Blue on all wounds. 1/17; right dorsal foot distally looks better today. The left foot has 2 open wounds that are about the same surrounding erythema. 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 No malignancy they did not stain for fungus 1/24; his right distal foot is not closed dry and scaly but the wound looks like it is contracting. I did not debride anything here. Problem on his left dorsal foot with expanding erythema. Apparently there were problems last week getting the Elesa Hacker however it is now available at the [facility] but [name] week later. He is using ketoconazole and Coloplast to the left buttock along with Hydrofera Blue this actually looks quite [name] bit better today. 1/31; right dorsal foot again is dry and scaly but looking to contract. He has been using [name] moisturizer on his feet at my request but he  is not sure which 1. The left dorsal foot wounds look about the same there is erythema here that I marked last week however after course of Nuzyra it certainly is not any better but not any worse either. Finally on his left buttock the skin continues to look better he has the original wound but [name] new substantial area towards the gluteal cleft. Almost like [name] skin tear. I used scissors to remove skin and subcutaneous tissue here silver nitrate and direct pressure 2/7; right dorsal foot. This does not look too much different from last week. Some erythema skin dry and scaly. No debridement. Left dorsal great toe again still not much improvement. I did remove flaking dry skin and callus from around the edge. Finally on his left buttock. The skin is somewhat better in the periwound. Surface wounds are superficial somewhat  better than last week. 01/26/2022: Is [name] little bit of [name] mystery as to why his wounds fail to respond to treatments and actually seem to get worse. This is my first encounter with this patient. He was previously followed by Dr. Dellia Nims. Based upon my review of the chart, it seems that there is [name] little bit of [name] mystery as to why his wounds do not respond as anticipated to the interventions applied and sometimes even get worse. Biopsies have been performed and he was seen by dermatology in [place], but that did not shed any light on the matter. T oday, his gluteal wound is larger, with substantial drainage, rather malodorous. The food wounds are not terrible, but he has [name] lot of callus and scaly skin around these. He is currently getting silver alginate on the gluteal wound, with idodoflex to the feet. He is using lotrisone on his legs for the dry, scaly skin. 02/09/2022: There has really been no change to any of his wounds. The gluteal wound less drainage and odor, but remains about the same size, the periwound skin remains oddly scaly. His lower extremity wounds also appear roughly the same size. They continue to accumulate [name] small amount of slough. The periwound on his feet and ankle wounds has dry eschar and loose dead skin. We have been using silver alginate on the gluteal wound and Iodoflex on his feet and ankle wounds. T the periwound around his gluteal lesion and Lotrisone on his feet and legs. o 02/23/2022: The right plantar foot wound is closed. The gluteal site looks small but has continued to produce hypertrophic granulation tissue. The foot wounds all look about the same on the dorsal surface of the right foot; on the left, there is only [name] small open area at the site of where his left great toenail would have been. 03/16/2022: The right ankle wound is healed. The right dorsal foot wound is about the same. The left dorsal foot wound is quite [name] bit smaller and the ischial wound is nearly  closed. 03/30/2022: The right ankle wound reopened. Both dorsal foot wounds are quite [name] bit smaller. Unfortunately, he appears to have sheared part of his ischial wound open further, perhaps during [name] transfer. 04/13/2022: The right ankle wound has hypertrophic granulation tissue present. The dorsal foot wounds continue to decrease in size. The ischial wound looks about the same today, no better, no worse. 04/27/2022: The right ankle wound has closed. Unfortunately, it looks like some moisture got underneath the dry skin on both of his dorsal feet and these wounds have expanded in size. The ischial wound remains the same with perhaps [name] little  bit more slough accumulation than at our previous visit. 05/11/2022: The right ankle wound remains closed. There is [name] left anterior tibial wound that is small has patchy openings with accumulated slough. The dorsum of his right foot appears to be nearly healed with just [name] small punctate opening. The plantar surface of his right foot has [name] new opening that looks like he may have picked some skin there. His sacral ulcer has hypertrophic granulation tissue but has some slough accumulation. The dorsum of his left foot has multiple open areas in [name] fairly ragged distribution. All of these have slough accumulated within them. 06/01/2022: The right ankle and left anterior tibial wound are both closed. Dorsum of his right foot and left foot both look substantially better with just tiny scattered openings The without any slough accumulation. He has sheared open new areas on his left gluteus and ischium. He says that his wheelchair cushion, which is air-filled, has [name] leak and so it keeps deflating. He is awaiting [name] new cushion. 06/15/2022: The right ankle wound has reopened and the fat layer is exposed. Both dorsal feet have just small openings with just [name] little bit of slough and eschar accumulation. The wound on his left gluteus and ischium is larger again today and has [name] foul  odor. 06/29/2022: The right ankle wound has hypertrophic granulation tissue buildup. His dorsal foot wounds both look better with just some eschar on the surface. He has [name] new wound on his left lateral ankle. He is not sure how he acquired it but by appearance, it looks that he hit it on something, potentially his wheelchair or bed. The ischial wound is about the same but is cleaner without any significant purulent drainage or odor. He did not understand what the [facility] call was about and therefore he does not have the topical compounded antibiotic. 07/13/2022: The right ankle wound again has hypertrophic granulation tissue, but less so than at his previous visit. The ischial wound has improved tremendously the use of the [facility] topical antibiotic. No significant change to the left lateral ankle wound; it is fibrotic with slough present. The skin of both of his feet, especially on the right, has [name] yeasty appearance. 08/10/2022: There is again hypertrophic granulation tissue on the right anterior ankle wound. Both feet are about the same. The left lateral ankle wound is [name] little bit desiccated and has some slough buildup. He unfortunately suffered [name] new injury when he was removing his pants and they caught his bandage which caused [name] large skin tear on his left ischium, just distal to the existing wound. The existing left ischial wound, however, is significantly better with just [name] little light slough on the surface. 08/31/2022: The right anterior ankle wound is [name] lot smaller today underneath some eschar. No accumulation of hypertrophic granulation tissue. The left lateral ankle wound has some slough on the surface but is better in terms of moisture balance this week. The left dorsal foot does not really have any openings on it today. The right dorsal foot has some slough and eschar accumulation. His gluteal ulcer is basically closed aside from 2 small areas that are oozing [name] bit. 09/21/2022: The right  anterior ankle wound is down to just [name] tiny pinhole. The left lateral ankle wound has accumulated slough again, but moisture balance is good. Left and right dorsal feet both have 2 small openings with some slough in them. The gluteal ulcer, unfortunately has opened up substantially. 10/05/2022: The right anterior ankle wound  has reopened and has [name] fair amount of slough on the surface. The left lateral ankle wound has accumulated more slough, as well. He was approved for Apligraf, but the wound is not clean enough yet. There is some slough buildup on the dorsal right foot wound, as well as Galbraith, Marcelle [name] ([id number]) [id number] Page 31 of 36 on his ischium. 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 10/26/2022: The right anterior ankle wound has closed again. The left lateral ankle wound looks quite [name] bit better. It is ready for Apligraf but we do not have 1 here for him. His ischium continues to get worse, with the wound larger and deeper. It really seems like this is [name] combination of pressure and friction. He has 2 new wounds, 1 on the plantar aspect of each foot. He says that he had some dry skin there that peeled away. Both are superficial. The dorsum of his left foot does not have any new wound opening. The dorsum of his right foot has [name] bit of slough accumulation. 11/24/2022: The right anterior ankle wound has 2 small open areas, both covered with eschar. The left lateral ankle wound has closed and considerably with just [name] little slough on the surface. The ischium has improved most significantly, having closed in most of the open area with just [name] few small remaining superficial openings. The dorsal foot wounds are small and superficial. The bottoms of his feet are closed. Patient History Information obtained from Patient. Family History Diabetes - Father, Hypertension - Mother, No family history of Cancer, Heart  Disease, Hereditary Spherocytosis, Kidney Disease, Lung Disease, Stroke, Thyroid Problems, Tuberculosis. Social History Former smoker, Marital Status - Married, Alcohol Use - Moderate, Drug Use - No History, Caffeine Use - Daily. Medical History Ear/Nose/Mouth/Throat Denies history of Chronic sinus problems/congestion, Middle ear problems Hematologic/Lymphatic Denies history of Anemia, Hemophilia, Human Immunodeficiency Virus, Lymphedema, Sickle Cell Disease Respiratory Patient has history of Sleep Apnea - does not tolerate cpap Denies history of Aspiration, Asthma, Chronic Obstructive Pulmonary Disease (COPD), Pneumothorax, Tuberculosis Cardiovascular Patient has history of Hypertension - lisinopril HCTZ Denies history of Angina, Arrhythmia, Congestive Heart Failure, Coronary Artery Disease, Deep Vein Thrombosis, Hypotension, Myocardial Infarction, Peripheral Arterial Disease, Peripheral Venous Disease, Phlebitis, Vasculitis Gastrointestinal Denies history of Cirrhosis , Colitis, Crohnoos, Hepatitis [name], Hepatitis B, Hepatitis C Endocrine Denies history of Type I Diabetes, Type II Diabetes Genitourinary Denies history of End Stage Renal Disease Immunological Denies history of Lupus Erythematosus, Raynaudoos, Scleroderma Integumentary (Skin) Denies history of History of Burn Musculoskeletal Denies history of Gout, Rheumatoid Arthritis, Osteoarthritis, Osteomyelitis Neurologic Patient has history of Paraplegia Denies history of Dementia, Neuropathy, Quadriplegia, Seizure Disorder Oncologic Denies history of Received Chemotherapy, Received Radiation Psychiatric Denies history of Anorexia/bulimia, Confinement Anxiety Hospitalization/Surgery History - cellulitis in leg. - left leg vein ablation. Objective Constitutional Tachycardic, asymptomatic.Marland Kitchen No acute distress. Vitals Time Taken: 9:39 AM, Height: 70 in, Weight: 216 lbs, BMI: 31, Temperature: 98.3 F, Pulse: 132 bpm,  Respiratory Rate: 18 breaths/min, Blood Pressure: 131/84 mmHg. Respiratory Normal work of breathing on room air. General Notes: 11/24/2022: The right anterior ankle wound has 2 small open areas, both covered with eschar. The left lateral ankle wound has closed and considerably with just [name] little slough on the surface. The ischium has improved most significantly, having closed in most of the open area with just [name] few small remaining superficial openings. The dorsal foot wounds are small and  superficial. The bottoms of his feet are closed. Integumentary (Hair, Skin) Wound #52 status is Open. Original cause of wound was Gradually Appeared. The date acquired was: 03/27/2021. The wound has been in treatment 86 [name]. The wound is located on the Right,Dorsal Foot. The wound measures 0.5cm length x 0.5cm width x 0.1cm depth; 0.196cm^2 area and 0.02cm^3 volume. There is Fat Layer (Subcutaneous Tissue) exposed. There is no tunneling or undermining noted. There is [name] medium amount of serosanguineous drainage noted. The wound margin is distinct with the outline attached to the wound base. There is large (67-100%) red granulation within the wound bed. There is [name] small (1-33%) amount of necrotic tissue within the wound bed including Adherent Slough. The periwound skin appearance had no abnormalities noted for texture. The periwound skin appearance exhibited: Dry/Scaly, Erythema. The surrounding wound skin color is noted with erythema. Erythema is measured at 3 cm. Periwound temperature was noted as No Abnormality. Stailey, LOUKAS ANTONSON ([id number]) [id number].pdf Page 32 of 36 Wound #56 status is Open. Original cause of wound was Gradually Appeared. The date acquired was: 07/11/2021. The wound has been in treatment 70 [name]. The wound is located on the Left,Dorsal Foot. The wound measures 1cm length x 1.6cm width x 0.1cm depth; 1.257cm^2 area and 0.126cm^3 volume. There is Fat Layer (Subcutaneous  Tissue) exposed. There is no tunneling or undermining noted. There is [name] small amount of serous drainage noted. The wound margin is flat and intact. There is small (1-33%) red, pink granulation within the wound bed. There is [name] small (1-33%) amount of necrotic tissue within the wound bed including Adherent Slough. The periwound skin appearance had no abnormalities noted for texture. The periwound skin appearance exhibited: Dry/Scaly, Erythema. The periwound skin appearance did not exhibit: Maceration. The surrounding wound skin color is noted with erythema. Periwound temperature was noted as Hot. Wound #65 status is Open. Original cause of wound was Pressure Injury. The date acquired was: 05/24/2022. The wound has been in treatment 25 [name]. The wound is located on the Left Upper Leg. The wound measures 8cm length x 3cm width x 0.1cm depth; 18.85cm^2 area and 1.885cm^3 volume. There is Fat Layer (Subcutaneous Tissue) exposed. There is no tunneling or undermining noted. There is [name] large amount of serosanguineous drainage noted. The wound margin is flat and intact. There is large (67-100%) red, friable granulation within the wound bed. There is [name] small (1-33%) amount of necrotic tissue within the wound bed including Eschar and Adherent Slough. The periwound skin appearance had no abnormalities noted for color. The periwound skin appearance exhibited: Excoriation, Scarring, Dry/Scaly, Maceration. Periwound temperature was noted as No Abnormality. Wound #66 status is Open. Original cause of wound was Pressure Injury. The date acquired was: 06/15/2022. The wound has been in treatment 23 [name]. The wound is located on the Right,Anterior Ankle. The wound measures 0.1cm length x 0.1cm width x 0.1cm depth; 0.008cm^2 area and 0.001cm^3 volume. There is Fat Layer (Subcutaneous Tissue) exposed. There is no tunneling or undermining noted. There is [name] small amount of serosanguineous drainage noted. The wound margin is  flat and intact. There is small (1-33%) red granulation within the wound bed. There is [name] large (67-100%) amount of necrotic tissue within the wound bed including Eschar. The periwound skin appearance had no abnormalities noted for texture. The periwound skin appearance had no abnormalities noted for moisture. The periwound skin appearance exhibited: Erythema. The surrounding wound skin color is noted with erythema. Erythema is measured at 1.5 cm. Periwound temperature  was noted as No Abnormality. Wound #67 status is Open. Original cause of wound was Gradually Appeared. The date acquired was: 06/22/2022. The wound has been in treatment 21 [name]. The wound is located on the Left,Lateral Lower Leg. The wound measures 3.8cm length x 0.5cm width x 0.1cm depth; 1.492cm^2 area and 0.149cm^3 volume. There is Fat Layer (Subcutaneous Tissue) exposed. There is no tunneling or undermining noted. There is [name] medium amount of serosanguineous drainage noted. The wound margin is distinct with the outline attached to the wound base. There is large (67-100%) red granulation within the wound bed. There is [name] small (1-33%) amount of necrotic tissue within the wound bed including Eschar. The periwound skin appearance had no abnormalities noted for texture. The periwound skin appearance exhibited: Dry/Scaly, Erythema. The surrounding wound skin color is noted with erythema with red streaks. Periwound temperature was noted as No Abnormality. Wound #69 status is Healed - Epithelialized. Original cause of wound was Gradually Appeared. The date acquired was: 10/26/2022. The wound has been in treatment 4 [name]. The wound is located on the [place]. The wound measures 0cm length x 0cm width x 0cm depth; 0cm^2 area and 0cm^3 volume. There is no tunneling or undermining noted. There is [name] none present amount of drainage noted. The wound margin is flat and intact. There is no granulation within the wound bed. There is no  necrotic tissue within the wound bed. The periwound skin appearance had no abnormalities noted for color. The periwound skin appearance exhibited: Callus, Dry/Scaly. Periwound temperature was noted as No Abnormality. Wound #70 status is Open. Original cause of wound was Gradually Appeared. The date acquired was: 10/26/2022. The wound has been in treatment 4 [name]. The wound is located on the [place]. The wound measures 0cm length x 0cm width x 0cm depth; 0cm^2 area and 0cm^3 volume. There is no tunneling or undermining noted. There is [name] none present amount of drainage noted. The wound margin is flat and intact. There is no granulation within the wound bed. There is no necrotic tissue within the wound bed. The periwound skin appearance had no abnormalities noted for moisture. The periwound skin appearance had no abnormalities noted for color. The periwound skin appearance exhibited: Callus. Periwound temperature was noted as No Abnormality. 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 There was [name] Selective/Open Wound Non-Viable Tissue Debridement with [name] total area of 12 sq cm performed by [name] Maudlin, MD. With the following instrument(s): Curette to remove Non-Viable tissue/material. Material removed includes Slough and Biofilm and. No specimens were taken. [name]  time out was conducted at 09:56, prior to the start of the procedure. [name] Minimum amount of bleeding was controlled with Pressure. The procedure was tolerated well. Post Debridement Measurements: 8cm length x 3cm width x 0.1cm depth; 1.885cm^3 volume. Post debridement Stage noted as Category/Stage III. Character of Wound/Ulcer Post Debridement is improved. Post procedure Diagnosis Wound #65: Same as Pre-Procedure  General Notes: scribed for Dr. Celine [name] by [name] Peals, RN. Wound #66 Pre-procedure diagnosis of Wound #66 is [name] Venous Leg Ulcer located on the Right,Anterior Ankle .Severity of Tissue Pre Debridement is: Fat layer exposed. There was [name] Selective/Open Wound Non-Viable Tissue Debridement with [name] total area of 0.16 sq cm performed by [name] Maudlin, MD. With the following instrument(s): Curette to remove Non-Viable tissue/material. Material removed includes Eschar. No specimens were taken. [name] time out was conducted at 09:56, prior to the start of the procedure. [name] Minimum amount of bleeding was controlled with Pressure. The procedure was tolerated well. Post Debridement Measurements: 0.1cm length x 0.1cm width x 0.1cm depth; 0.001cm^3 volume. Character of Wound/Ulcer Post Debridement is improved. Severity of Tissue Post Debridement is: Fat layer exposed. Post procedure Diagnosis Wound #66: Same as Pre-Procedure General Notes: scribed for Dr. Celine [name] by [name] Peals, RN. 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 Scroggs, VENICE LIZ ([id number]) [id number].pdf Page 33 of 36 There was [name] Selective/Open Wound Non-Viable Tissue Debridement with [name] total area of 1.9 sq cm performed by [name] Maudlin, MD. With the following instrument(s): Curette to remove Non-Viable tissue/material. Material removed includes [facility]. No specimens were taken. [name] time out  was conducted at 09:56, prior to the start of the procedure. [name] Minimum amount of bleeding was controlled with Pressure. The procedure was tolerated well. Post Debridement Measurements: 3.8cm length x 0.5cm width x 0.1cm depth; 0.149cm^3 volume. Character of Wound/Ulcer Post Debridement is improved. Severity of Tissue Post Debridement is: Fat layer exposed. Post procedure Diagnosis Wound #67: Same as Pre-Procedure General Notes: scribed for Dr. Celine [name] by [name] Peals, RN. Plan Follow-up Appointments: Return Appointment in 2 [name]. - Dr. Celine [name] RM 1 Bathing/ Shower/ Hygiene: May shower and wash wound with soap and water. - on days that dressing is changed Edema Control - Lymphedema / SCD / Other: Moisturize legs daily. - using Aquaphor generously to both legs and feet with dressing changes Compression stocking or Garment 30-40 mm/Hg pressure to: - Juxtalite to both legs daily Off-Loading: Roho cushion for wheelchair Turn and reposition every 2 hours WOUND #52: - Foot Wound Laterality: Dorsal, Right Cleanser: Soap and Water Every Other Day/30 Days Discharge Instructions: May shower and wash wound with dial antibacterial soap and water prior to dressing change. Cleanser: Wound Cleanser Every Other Day/30 Days Discharge Instructions: Cleanse the wound with wound cleanser prior to applying [name] clean dressing using gauze sponges, not tissue or cotton balls. Peri-Wound Care: Sween Lotion (Moisturizing lotion) Every Other Day/30 Days Discharge Instructions: Apply Aquaphor moisturizing lotion as directed Prim Dressing: [place] 2x2 (in/in) Every Other Day/30 Days ary Discharge Instructions: Apply to wound bed as instructed Secondary Dressing: Woven Gauze Sponge, Non-Sterile 4x4 in Every Other Day/30 Days Discharge Instructions: Apply over primary dressing as directed. Secured With: [employer], 4.5x3.1 (in/yd) (Generic) Every Other Day/30 Days Discharge Instructions:  Secure with Kerlix as directed. Secured With: 20M Medipore H Soft Cloth Surgical T ape, 4 x 10 (in/yd) (Generic) Every Other Day/30 Days Discharge Instructions: Secure with tape as directed. Com pression Stockings: Circaid Juxta Lite Compression Wrap Compression Amount: 30-40 mmHg (right) Discharge Instructions: Apply Circaid Juxta Lite Compression Wrap daily as instructed. Apply first thing in the morning, remove at night before bed. WOUND #56: - Foot Wound Laterality: Dorsal, Left Cleanser: Soap and Water Every Other Day/30 Days Discharge Instructions: May shower  and wash wound with dial antibacterial soap and water prior to dressing change. Cleanser: Wound Cleanser Every Other Day/30 Days Discharge Instructions: Cleanse the wound with wound cleanser prior to applying [name] clean dressing using gauze sponges, not tissue or cotton balls. Peri-Wound Care: Sween Lotion (Moisturizing lotion) Every Other Day/30 Days Discharge Instructions: Apply Aquaphor moisturizing lotion as directed Prim Dressing: [place] 2x2 (in/in) Every Other Day/30 Days ary Discharge Instructions: Apply to wound bed as instructed Secondary Dressing: Woven Gauze Sponge, Non-Sterile 4x4 in Every Other Day/30 Days Discharge Instructions: Apply over primary dressing as directed. Secured With: [employer], 4.5x3.1 (in/yd) (Generic) Every Other Day/30 Days Discharge Instructions: Secure with Kerlix as directed. Secured With: 26M Medipore H Soft Cloth Surgical T ape, 4 x 10 (in/yd) (Generic) Every Other Day/30 Days Discharge Instructions: Secure with tape as directed. WOUND #65: - Upper Leg Wound Laterality: Left Cleanser: Soap and Water Every Other Day/30 Days Discharge Instructions: May shower and wash wound with dial antibacterial soap and water prior to dressing change. Cleanser: Wound Cleanser Every Other Day/30 Days Discharge Instructions: Cleanse the wound with wound cleanser prior to applying [name] clean  dressing using gauze sponges, not tissue or cotton balls. Peri-Wound Care: Zinc Oxide Ointment 30g tube Every Other Day/30 Days Discharge Instructions: Apply Zinc Oxide to periwound with each dressing change Topical: Keystone antibiotic compound Every Other Day/30 Days Discharge Instructions: thin layer to wound bed, use daily once available Prim Dressing: Cutimed Sorbact Swab Every Other Day/30 Days ary Discharge Instructions: Apply to wound bed as instructed Secondary Dressing: Zetuvit Plus Silicone Border Dressing 7x7(in/in) Every Other Day/30 Days Discharge Instructions: Apply silicone border over primary dressing as directed. WOUND #66: - Ankle Wound Laterality: Right, Anterior Cleanser: Soap and Water Every Other Day/30 Days Discharge Instructions: May shower and wash wound with dial antibacterial soap and water prior to dressing change. Cleanser: Wound Cleanser Every Other Day/30 Days Discharge Instructions: Cleanse the wound with wound cleanser prior to applying [name] clean dressing using gauze sponges, not tissue or cotton balls. Peri-Wound Care: Sween Lotion (Moisturizing lotion) Every Other Day/30 Days Discharge Instructions: Apply Aquaphor moisturizing lotion as directed Prim Dressing: [place] 2x2 (in/in) Every Other Day/30 Days ary Discharge Instructions: Apply to wound bed as instructed Secured With: Kerlix Roll Sterile, 4.5x3.1 (in/yd) (Generic) Every Other Day/30 Days Discharge Instructions: Secure with Kerlix as directed. Secured With: 26M Medipore H Soft Cloth Surgical T ape, 4 x 10 (in/yd) (Generic) Every Other Day/30 Days Discharge Instructions: Secure with tape as directed. WOUND #67: - Lower Leg Wound Laterality: Left, Lateral Cleanser: Soap and Water Every Other Day/30 Days Discharge Instructions: May shower and wash wound with dial antibacterial soap and water prior to dressing change. Cleanser: Wound Cleanser Every Other Day/30 Days Discharge  Instructions: Cleanse the wound with wound cleanser prior to applying [name] clean dressing using gauze sponges, not tissue or cotton balls. Peri-Wound Care: Triamcinolone 15 (g) Every Other Day/30 Days Discharge Instructions: Use triamcinolone 15 (g) as directed Moya, [place] [name] ([id number]) [id number] Page 34 of 36 Peri-Wound Care: Sween Lotion (Moisturizing lotion) Every Other Day/30 Days Discharge Instructions: Apply Aquaphor moisturizing lotion as directed Topical: Mupirocin Ointment Every Other Day/30 Days Discharge Instructions: Apply Mupirocin (Bactroban) as instructed Prim Dressing: [place] AG Dressing 2x2 (in/in) Every Other Day/30 Days ary Discharge Instructions: Apply to wound bed as instructed Secondary Dressing: Woven Gauze Sponge, Non-Sterile 4x4 in Every Other Day/30 Days Discharge Instructions: Apply over primary dressing as directed. Secured With: [employer]  Sterile, 4.5x3.1 (in/yd) (Generic) Every Other Day/30 Days Discharge Instructions: Secure with Kerlix as directed. Secured With: 63M Medipore H Soft Cloth Surgical T ape, 4 x 10 (in/yd) (Generic) Every Other Day/30 Days Discharge Instructions: Secure with tape as directed. 11/24/2022: The right anterior ankle wound has 2 small open areas, both covered with eschar. The left lateral ankle wound has closed and considerably with just [name] little slough on the surface. The ischium has improved most significantly, having closed in most of the open area with just [name] few small remaining superficial openings. The dorsal foot wounds are small and superficial. The bottoms of his feet are closed. I used [name] curette to debride eschar off of the right ankle wound, slough off of the left lateral ankle wound, slough and biofilm off of the ischial wound. We will continue Keystone topical antibiotic to the ischium along with Sorbact. Mupirocin to the lateral ankle wound. Silver alginate to all open sites aside from  the ischium. Follow-up in 2 [name]. Electronic Signature(s) Signed: 11/24/2022 10:07:58 AM By: [name] Maudlin MD FACS Entered By: [name] on 11/24/2022 10:07:58 -------------------------------------------------------------------------------- HxROS Details Patient Name: Date of Service: Coufal, [name] 11/24/2022 9:15 [name] Medical Record Number: [id number] Patient Account Number: [id number] Date of Birth/Sex: Treating RN: November 08, 1986 (37 y.o. M) Primary Care Provider: [place], [place] Other Clinician: Referring Provider: Treating Provider/Extender: [name] in Treatment: 359 Information Obtained From Patient Ear/Nose/Mouth/Throat Medical History: Negative for: Chronic sinus problems/congestion; Middle ear problems Hematologic/Lymphatic Medical History: Negative for: Anemia; Hemophilia; Human Immunodeficiency Virus; Lymphedema; Sickle Cell Disease Respiratory Medical History: Positive for: Sleep Apnea - does not tolerate cpap Negative for: Aspiration; Asthma; Chronic Obstructive Pulmonary Disease (COPD); Pneumothorax; Tuberculosis Cardiovascular Medical History: Positive for: Hypertension - lisinopril HCTZ Negative for: Angina; Arrhythmia; Congestive Heart Failure; Coronary Artery Disease; Deep Vein Thrombosis; Hypotension; Myocardial Infarction; Peripheral Arterial Disease; Peripheral Venous Disease; Phlebitis; Vasculitis Gastrointestinal Medical History: Negative for: Cirrhosis ; Colitis; Crohns; Hepatitis [name]; Hepatitis B; Hepatitis C Endocrine Caruthers, Cainan [name] ([id number]) [id number] Page 35 of 36 Medical History: Negative for: Type I Diabetes; Type II Diabetes Genitourinary Medical History: Negative for: End Stage Renal Disease Immunological Medical History: Negative for: Lupus Erythematosus; Raynauds; Scleroderma Integumentary (Skin) Medical History: Negative for: History of Burn Musculoskeletal Medical  History: Negative for: Gout; Rheumatoid Arthritis; Osteoarthritis; Osteomyelitis Neurologic Medical History: Positive for: Paraplegia Negative for: Dementia; Neuropathy; Quadriplegia; Seizure Disorder Oncologic Medical History: Negative for: Received Chemotherapy; Received Radiation Psychiatric Medical History: Negative for: Anorexia/bulimia; Confinement Anxiety Immunizations Pneumococcal Vaccine: Received Pneumococcal Vaccination: No Immunization Notes: doesn't remember when last tetanus shot was Implantable Devices No devices added Hospitalization / Surgery History Type of Hospitalization/Surgery cellulitis in leg left leg vein ablation Family and Social History Cancer: No; Diabetes: Yes - Father; Heart Disease: No; Hereditary Spherocytosis: No; Hypertension: Yes - Mother; Kidney Disease: No; Lung Disease: No; Stroke: No; Thyroid Problems: No; Tuberculosis: No; Former smoker; Marital Status - Married; Alcohol Use: Moderate; Drug Use: No History; Caffeine Use: Daily; Financial Concerns: No; Food, Clothing or Shelter Needs: No; Support System Lacking: No; Transportation Concerns: No Electronic Signature(s) Signed: 11/24/2022 12:38:13 PM By: [name] Maudlin MD FACS Entered By: [name] on 11/24/2022 10:04:20 -------------------------------------------------------------------------------- SuperBill Details Patient Name: Date of Service: Slotnick, [name] 11/24/2022 Medical Record Number: [id number] Patient Account Number: [id number] Date of Birth/Sex: Treating RN: 1985-11-28 (37 y.o. 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, 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 with other 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, 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 Non-pressure chronic ulcer of other part of left foot limited to breakdown 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 Facility Procedures : CPT4 Code: 60630160 Description: 10932 - DEBRIDE WOUND 1ST 20 SQ CM OR < ICD-10 Diagnosis Description 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 L89.323 Pressure ulcer of  left buttock, stage 3 Modifier: Quantity: 1 Physician Procedures : CPT4 Code Description Modifier [id number] - WC PHYS LEVEL 4 - EST PT 25 ICD-10 Diagnosis Description 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

## 2022-11-24 NOTE — Progress Notes (Signed)
[name], [name] ([id number]) [id number] Page 1 of 17 Visit Report for 11/24/2022 Arrival Information Details Patient Name: Date of Service: [name], [name] 11/24/2022 9:15 [name] Medical Record Number: [id number] Patient Account Number: [id number] Date of Birth/Sex: Treating RN: April 28, 1986 (36 y.o. [name] Primary Care [name]: [name], [name] Other Clinician: Referring [name]: Treating Shakiah Wester/Extender: [name] in Treatment: 48 Visit Information History Since Last Visit Added or deleted any medications: No Patient Arrived: Wheel Chair Any new allergies or adverse reactions: No Arrival Time: 09:35 Had [name] fall or experienced change in No Accompanied By: self activities of daily living that may affect Transfer Assistance: Manual risk of falls: Patient Identification Verified: Yes Signs or symptoms of abuse/neglect since last visito No Secondary Verification Process Completed: Yes Hospitalized since last visit: No Patient Requires Transmission-Based Precautions: No Implantable device outside of the clinic excluding No Patient Has Alerts: Yes cellular tissue based products placed in the center Patient Alerts: R ABI = 1.0 since last visit: L ABI = 1.1 Has Dressing in Place as Prescribed: Yes Has Compression in Place as Prescribed: Yes Pain Present Now: No Electronic Signature(s) Signed: 11/24/2022 4:35:26 PM By: Adline Peals Entered By: Adline Peals on 11/24/2022 11:53:22 -------------------------------------------------------------------------------- Encounter Discharge Information Details Patient Name: Date of Service: [name], [name] 11/24/2022 9:15 [name] Medical Record Number: [id number] Patient Account Number: [id number] Date of Birth/Sex: Treating RN: [birth year] (37 y.o. [name] Primary Care [name]: [name], [name] Other Clinician: Referring Matalynn Graff: Treating [name]/Extender: [name] in Treatment: 359 Encounter Discharge Information Items Post Procedure Vitals Discharge Condition: Stable Temperature (F): 98.3 Ambulatory Status: Wheelchair Pulse (bpm): 132 Discharge Destination: Home Respiratory Rate (breaths/min): 18 Transportation: Private Auto Blood Pressure (mmHg): 131/84 Accompanied By: self Schedule Follow-up Appointment: Yes Clinical Summary of Care: Patient Declined Electronic Signature(s) Signed: 11/24/2022 4:35:26 PM By: Adline Peals Entered By: Adline Peals on 11/24/2022 11:53:15 [name] ([id number]) [id number] Page 2 of 17 -------------------------------------------------------------------------------- Lower Extremity Assessment Details Patient Name: Date of Service: [name], [name] 11/24/2022 9:15 [name] Medical Record Number: [id number] Patient Account Number: [id number] Date of Birth/Sex: Treating RN: [birth year] (37 y.o. [name] Primary Care Danaja Lasota: [name] Other Clinician: Referring Jabree Pernice: Treating [name]/Extender: [name], [name] [name] in Treatment: 359 Edema Assessment Assessed: [Left: No] [Right: No] Edema: [Left: Yes] [Right: Yes] Calf Left: Right: Point of Measurement: 33 cm From Medial Instep 27.5 cm 31 cm Ankle Left: Right: Point of Measurement: 10 cm From Medial Instep 24.8 cm 25 cm Electronic Signature(s) Signed: 11/24/2022 4:35:26 PM By: Adline Peals Entered By: Adline Peals on 11/24/2022 09:40:12 -------------------------------------------------------------------------------- Multi Wound Chart Details Patient Name: Date of Service: [name], [name] 11/24/2022 9:15 [name] Medical Record Number: [id number] Patient Account Number: [id number] Date of Birth/Sex: Treating RN: [birth year] (37 y.o. [name]) Primary Care Breindel Collier: [name], [name] Other Clinician: Referring [name]: Treating [name]/Extender: [name], [name] [name] in Treatment: 359 Vital Signs Height(in): 70 Pulse(bpm): 132 Weight(lbs): 216 Blood Pressure(mmHg): 131/84 Body Mass Index(BMI): 31 Temperature(F): 98.3 Respiratory Rate(breaths/min): 18 [52:Photos: No Photos Right, Dorsal Foot Wound Location: Gradually Appeared Wounding Event: Trauma, Other Primary Etiology: Sleep Apnea, Hypertension, Paraplegia Sleep Apnea, Hypertension, Paraplegia Sleep Apnea, Hypertension, Paraplegia Comorbid History: 03/27/2021  Date Acquired: 40 [name] of Treatment: Open Wound Status: No Wound Recurrence: No Clustered Wound: 0.5x0.5x0.1 Measurements L x W x D (cm) 0.196 [name] (cm) : rea 0.02 Volume (cm) : 89.60% % Reduction in [name] rea: 89.40% %  Reduction in Volume: Full Thickness  Without Exposed Classification:] [56:No Photos Left, Dorsal Foot Gradually Appeared Neuropathic Ulcer-Non Diabetic 07/11/2021 70 Open No Yes 1x1.6x0.1 1.257 0.126 79.80% 79.70% Full Thickness Without Exposed] [65:No Photos Left Upper Leg Pressure Injury  Pressure Ulcer 05/24/2022 25 Open No No 8x3x0.1 18.85 1.885 -200.00% -200.20% Category/Stage III] [name] ([id number]) [52:Support Structures Medium Exudate [name] mount: Serosanguineous Exudate Type: red, brown Exudate Color: Distinct, outline attached Wound Margin: Large (67-100%) Granulation Amount: Red Granulation Quality: Small (1-33%) Necrotic Amount: Adherent Slough  Necrotic Tissue: Fat Layer (Subcutaneous Tissue): Yes Fat Layer (Subcutaneous Tissue): Yes Fat Layer (Subcutaneous Tissue): Yes Exposed Structures: Fascia: No Tendon: No Muscle: No Joint: No Bone: No Medium (34-66%) Epithelialization: [name] Debridement:  Pre-procedure Verification/Time Out [name] Taken: [name] Tissue Debrided: [name] Level: [name] Debridement [name] (sq cm): rea [name] Instrument: [name] Bleeding: [name] Hemostasis [name] chieved: [name] Debridement Treatment Response: [name] Post Debridement Measurements L x W x D (cm) [name]  Post Debridement Volume: (cm) [name] Post Debridement  Stage: No Abnormalities Noted Periwound Skin Texture: Dry/Scaly: Yes Periwound Skin Moisture: Erythema: Yes Periwound Skin Color: [name] Erythema Location: Measured: 3cm Erythema Measurement: [name] Erythema  Change: No Abnormality Temperature: [name] Procedures Performed:] [56:Support Structures Small Serous amber Flat and Intact Small (1-33%) Red, Pink Small (1-33%) Adherent Slough Fascia: No Tendon: No Muscle: No Joint: No Bone: No Large (67-100%) [name] [name]  [name] [name] [name] [name] [name] [name] [name] [name] [name] [name] No Abnormalities Noted Dry/Scaly: Yes Maceration: No Erythema: Yes [name] [name] No Change Hot [name]] [id number] Page 3 of 17 Large Serosanguineous red, brown Flat and Intact Large (67-100%)  Red, Friable Small (1-33%) Eschar, Adherent Slough Fascia: No Tendon: No Muscle: No Joint: No Bone: No Small (1-33%) Debridement - Selective/Open Wound 09:56 Slough Non-Viable Tissue 12 Curette Minimum Pressure Procedure was tolerated well 8x3x0.1 1.885  Category/Stage III Excoriation: Yes Scarring: Yes Maceration: Yes Dry/Scaly: Yes No Abnormalities Noted [name] [name] [name] No Abnormality Debridement] Wound Number: [id number] Photos: No Photos No Photos No Photos Right, Anterior Ankle Left, Lateral Lower Leg Right, Plantar Foot Wound Location: Pressure Injury Gradually Appeared Gradually Appeared Wounding Event: Venous Leg Ulcer Venous Leg Ulcer T be determined o Primary Etiology: Sleep Apnea, Hypertension, Paraplegia Sleep Apnea, Hypertension, Paraplegia Sleep Apnea, Hypertension, Paraplegia Comorbid History: 06/15/2022 06/22/2022 10/26/2022 Date Acquired: _0 [name] of Treatment: Open Open Healed - Epithelialized Wound Status: No No No Wound Recurrence: No No No Clustered Wound: 0.1x0.1x0.1 3.8x0.5x0.1 0x0x0 Measurements L x W x D (cm) 0.008 1.492 0 [name] (cm) : rea 0.001 0.149 0 Volume (cm) : 99.40% 73.40% 100.00% % Reduction in [name] rea: 99.20% 73.40% 100.00% % Reduction in Volume: Full  Thickness Without Exposed Full Thickness Without Exposed Full Thickness Without Exposed Classification: Support Structures Support Structures Support Structures Small Medium None Present Exudate [name] mount: Serosanguineous Serosanguineous [name] Exudate Type: red, brown red, brown [name] Exudate Color: Flat and Intact Distinct, outline attached Flat and Intact Wound Margin: Small (1-33%) Large (67-100%) None Present (0%) Granulation [name] mount: Red Red [name] Granulation Quality: Large (67-100%) Small (1-33%) None Present (0%) Necrotic [name] mount: Eschar Eschar [name] Necrotic Tissue: Fat Layer (Subcutaneous Tissue): Yes Fat Layer (Subcutaneous Tissue): Yes Fascia: No Exposed Structures: Fascia: No Fascia: No Fat Layer (Subcutaneous Tissue): No Tendon: No Tendon: No Tendon: No Muscle: No Muscle: No Muscle: No Joint: No Joint: No Joint: No Bone: No Bone: No Bone: No Large (67-100%) Medium (34-66%) Large (67-100%) Epithelialization: Debridement - Selective/Open Wound Debridement -  Selective/Open Wound [name] Debridement: Pre-procedure Verification/Time Out 09:56 09:56 [name] Taken: Necrotic/Eschar Slough [name] Tissue Debrided: Non-Viable Tissue Non-Viable Tissue [name] Level: 0.16 1.9 [name] Debridement [name] (sq cm): rea Curette Curette [name] Instrument: Minimum Minimum [name] Bleeding: Pressure Pressure [name] Hemostasis [name] chieved: Procedure was tolerated well Procedure was tolerated well [name] Debridement Treatment Response: 0.1x0.1x0.1 3.8x0.5x0.1 [name] Post Debridement Measurements L x W x D (cm) Gagen, Jaquez [name] ([id number]) [id number] Page 4 of 17 0.001 0.149 [name] Post Debridement Volume: (cm) [name] [name] [name] Post Debridement Stage: No Abnormalities Noted No Abnormalities Noted Callus: Yes Periwound Skin Texture: No Abnormalities Noted Dry/Scaly: Yes Dry/Scaly: Yes Periwound Skin Moisture: Erythema: Yes Erythema: Yes No Abnormalities Noted Periwound Skin Color: [name] Red  Streaks [name] Erythema Location: Measured: 1.5cm [name] [name] Erythema Measurement: [name] [name] [name] Erythema Change: No Abnormality No Abnormality No Abnormality Temperature: Debridement Debridement [name] Procedures Performed: Wound Number: 70 [name] [name] Photos: No Photos [name] [name] Left, Plantar Foot [name] [name] Wound Location: Gradually Appeared [name] [name] Wounding Event: T be determined o [name] [name] Primary Etiology: Sleep Apnea, Hypertension, Paraplegia [name] [name] Comorbid History: 10/26/2022 [name] [name] Date Acquired: 4 [name] [name] of Treatment: Open [name] [name] Wound Status: No [name] [name] Wound Recurrence: No [name] [name] Clustered Wound: 0x0x0 [name] [name] Measurements L x W x D (cm) 0 [name] [name] [name] (cm) : rea 0 [name] [name] Volume (cm) : 100.00% [name] [name] % Reduction in [name] rea: 100.00% [name] [name] % Reduction in Volume: Full Thickness Without Exposed [name] [name] Classification: Support Structures None Present [name] [name] Exudate [name] mount: [name] [name] [name] Exudate Type: [name] [name] [name] Exudate Color: Flat and Intact [name] [name] Wound Margin: None Present (0%) [name] [name] Granulation [name] mount: [name] [name] [name] Granulation Quality: None Present (0%) [name] [name] Necrotic [name] mount: [name] [name] [name] Necrotic Tissue: Fascia: No [name] [name] Exposed Structures: Fat Layer (Subcutaneous Tissue): No Tendon: No Muscle: No Joint: No Bone: No Large (67-100%) [name] [name] Epithelialization: [name] [name] [name] Debridement: [name] [name] [name] Tissue Debrided: [name] [name] [name] Level: [name] [name] [name] Debridement [name] (sq cm): rea [name] [name] [name] Instrument: [name] [name] [name] Bleeding: [name] [name] [name] Hemostasis [name] chieved: Debridement Treatment Response: [name] [name] [name] Post Debridement Measurements L x [name] [name] [name] W x D (cm) [name] [name] [name] Post Debridement Volume: (cm) [name] [name] [name] Post Debridement Stage: Callus: Yes [name] [name] Periwound Skin Texture: No Abnormalities Noted [name] [name] Periwound Skin Moisture: Erythema: Yes [name] [name] Periwound Skin Color: [name] [name] [name] Erythema Location: [name] [name]  [name] Erythema Measurement: [name] [name] [name] Erythema Change: No Abnormality [name] [name] Temperature: [name] [name] [name] Procedures Performed: Treatment Notes Electronic Signature(s) Signed: 11/24/2022 10:00:46 AM By: Fredirick Maudlin MD FACS Entered By: Fredirick Maudlin on 11/24/2022 10:00:46 Multi-Disciplinary Care Plan Details -------------------------------------------------------------------------------- Offenberger, [name] ([id number]) [id number] Page 5 of 17 Patient Name: Date of Service: Eimers, [name] 11/24/2022 9:15 [name] Medical Record Number: [id number] Patient Account Number: [id number] Date of Birth/Sex: Treating RN: 02/08/1986 (37 y.o. [name] Primary Care Patsie Mccardle: [name], [name] Other Clinician: Referring Naiya Corral: Treating Tyrena Gohr/Extender: [name] in Treatment: [place] reviewed with physician Active Inactive Pressure Nursing Diagnoses: Knowledge deficit related to management of pressures ulcers Potential for impaired tissue integrity related to pressure, friction, moisture, and shear Goals: Patient will remain free from development of additional pressure ulcers Date Initiated: 06/21/2018 Date Inactivated: 02/12/2019 Target Resolution Date: 02/16/2019 Unmet Reason: pressure ulcer Goal Status: Unmet reopened on left ischium Patient/caregiver  will verbalize risk factors for pressure ulcer development Date Initiated: 06/21/2018 Date Inactivated: 05/28/2019 Target Resolution Date: 06/01/2019 Goal Status: Met Patient/caregiver will verbalize understanding of pressure ulcer management Date Initiated: 06/01/2022 Target Resolution Date: 12/24/2022 Goal Status: Active Interventions: Assess: immobility, friction, shearing, incontinence upon admission and as needed Assess offloading mechanisms upon admission and as needed Assess potential for pressure ulcer upon admission and as needed Treatment  Activities: Pressure reduction/relief device ordered : 12/05/2017 Notes: Wound/Skin Impairment Nursing Diagnoses: Impaired tissue integrity Knowledge deficit related to ulceration/compromised skin integrity Goals: Patient/caregiver will verbalize understanding of skin care regimen Date Initiated: 06/01/2022 Target Resolution Date: 12/24/2022 Goal Status: Active Ulcer/skin breakdown will have [name] volume reduction of 30% by week 4 Date Initiated: 06/01/2022 Date Inactivated: 06/29/2022 Target Resolution Date: 06/29/2022 Unmet Reason: complex wounds, Goal Status: Unmet infection Interventions: Assess patient/caregiver ability to obtain necessary supplies Assess ulceration(s) every visit Provide education on ulcer and skin care Notes: 02/02/21: Complex Care, ongoing. Electronic Signature(s) Signed: 11/24/2022 4:35:26 PM By: Adline Peals Entered By: Adline Peals on 11/24/2022 09:55:23 Rita, [name] ([id number]) [id number] Page 6 of 17 -------------------------------------------------------------------------------- Pain Assessment Details Patient Name: Date of Service: [name], [name] 11/24/2022 9:15 [name] Medical Record Number: [id number] Patient Account Number: [id number] Date of Birth/Sex: Treating RN: March 05, 1986 (37 y.o. [name] Primary Care Bobbyjo Marulanda: [place], [place] Other Clinician: Referring Sydnei Ohaver: Treating Shakesha Soltau/Extender: [name] in Treatment: 359 Active Problems Location of Pain Severity and Description of Pain Patient Has Paino No Site Locations Rate the pain. Current Pain Level: 0 Pain Management and Medication Current Pain Management: Electronic Signature(s) Signed: 11/24/2022 4:35:26 PM By: Adline Peals Entered By: Adline Peals on 11/24/2022 09:40:04 -------------------------------------------------------------------------------- Patient/Caregiver Education Details Patient Name: Date  of Service: [name], [name] Viviann Spare 1/3/2024andnbsp9:15 [name] Medical Record Number: [id number] Patient Account Number: [id number] Date of Birth/Gender: Treating RN: Mar 05, 1986 (37 y.o. [name] Primary Care Physician: Janine Limbo Other Clinician: Referring Physician: Treating Physician/Extender: [name] in Treatment: 63 Education Assessment Education Provided To: Patient Education Topics Provided Wound/Skin Impairment: Methods: Explain/Verbal Responses: Reinforcements needed, State content correctly Electronic Signature(s) Signed: 11/24/2022 4:35:26 PM By: Adline Peals Entered By: Adline Peals on 11/24/2022 09:55:43 Maue, [name] ([id number]) [id number] Page 7 of 17 -------------------------------------------------------------------------------- Wound Assessment Details Patient Name: Date of Service: Bess, [name] 11/24/2022 9:15 [name] Medical Record Number: [id number] Patient Account Number: [id number] Date of Birth/Sex: Treating RN: Nov 10, 1986 (37 y.o. [name] Primary Care Kathrene Sinopoli: [place], [place] Other Clinician: Referring Karsin Pesta: Treating  Grosser/Extender: [name], [name] [name] in Treatment: 359 Wound Status Wound Number: 52 Primary Etiology: Trauma, Other Wound Location: Right, Dorsal Foot Wound Status: Open Wounding Event: Gradually Appeared Comorbid History: Sleep Apnea, Hypertension, Paraplegia Date Acquired: 03/27/2021 [name] Of Treatment: 86 Clustered Wound: No Photos Photo Uploaded By: Donavan Burnet on 11/24/2022 17:26:11 Wound Measurements Length: (cm) 0. Width: (cm) 0. Depth: (cm) 0. Area: (cm) 0 Volume: (cm) 0 5 % Reduction in Area: 89.6% 5 % Reduction in Volume: 89.4% 1 Epithelialization: Medium (34-66%) .196 Tunneling: No .02 Undermining: No Wound Description Classification: Full Thickness Without Exposed Suppor Wound Margin: Distinct, outline  attached Exudate Amount: Medium Exudate Type: Serosanguineous Exudate Color: red, brown t Structures Foul Odor After Cleansing: No Slough/Fibrino Yes Wound Bed Granulation Amount: Large (67-100%) Exposed Structure Granulation Quality: Red Fascia Exposed: No Necrotic Amount: Small (1-33%) Fat Layer (Subcutaneous Tissue) Exposed: Yes Necrotic Quality: Adherent Slough Tendon Exposed: No Muscle Exposed: No Joint Exposed: No  Bone Exposed: No Periwound Skin Texture Texture Color No Abnormalities Noted: Yes No Abnormalities Noted: No Erythema: Yes Moisture Erythema Measurement: Measured No Abnormalities Noted: No 3 cm Dry / Scaly: Yes Temperature / Pain Temperature: No Abnormality Treatment Notes Krage, [name] [name] ([id number]) [id number] Page 8 of 17 Wound #52 (Foot) Wound Laterality: Dorsal, Right Cleanser Soap and Water Discharge Instruction: May shower and wash wound with dial antibacterial soap and water prior to dressing change. Wound Cleanser Discharge Instruction: Cleanse the wound with wound cleanser prior to applying [name] clean dressing using gauze sponges, not tissue or cotton balls. Peri-Wound Care Sween Lotion (Moisturizing lotion) Discharge Instruction: Apply Aquaphor moisturizing lotion as directed Topical Primary Dressing Sorbalgon AG Dressing 2x2 (in/in) Discharge Instruction: Apply to wound bed as instructed Secondary Dressing Woven Gauze Sponge, Non-Sterile 4x4 in Discharge Instruction: Apply over primary dressing as directed. Secured With [employer], 4.5x3.1 (in/yd) Discharge Instruction: Secure with Kerlix as directed. 77M Medipore H Soft Cloth Surgical T ape, 4 x 10 (in/yd) Discharge Instruction: Secure with tape as directed. Compression Wrap Compression Stockings Circaid Juxta Lite Compression Wrap Quantity: 1 Right Leg Compression Amount: 30-40 mmHg Discharge Instruction: Apply Circaid Juxta Lite Compression Wrap  daily as instructed. Apply first thing in the morning, remove at night before bed. Add-Ons Electronic Signature(s) Signed: 11/24/2022 4:35:26 PM By: Adline Peals Entered By: Adline Peals on 11/24/2022 09:47:57 -------------------------------------------------------------------------------- Wound Assessment Details Patient Name: Date of Service: [name], [name] 11/24/2022 9:15 [name] Medical Record Number: [id number] Patient Account Number: [id number] Date of Birth/Sex: Treating RN: [birth year] (37 y.o. [name] Primary Care Porschea Borys: [place], [place] Other Clinician: Referring Merci Walthers: Treating Kendarious Gudino/Extender: [name], [name] [name] in Treatment: 359 Wound Status Wound Number: 56 Primary Etiology: Neuropathic Ulcer-Non Diabetic Wound Location: Left, Dorsal Foot Wound Status: Open Wounding Event: Gradually Appeared Comorbid History: Sleep Apnea, Hypertension, Paraplegia Date Acquired: 07/11/2021 [name] Of Treatment: 70 Clustered Wound: Yes Photos Duff, Noboru [name] ([id number]) [id number] Page 9 of 17 Photo Uploaded By: Donavan Burnet on 11/24/2022 17:26:11 Wound Measurements Length: (cm) 1 Width: (cm) 1.6 Depth: (cm) 0.1 Area: (cm) 1.257 Volume: (cm) 0.126 % Reduction in Area: 79.8% % Reduction in Volume: 79.7% Epithelialization: Large (67-100%) Tunneling: No Undermining: No Wound Description Classification: Full Thickness Without Exposed Support Structures Wound Margin: Flat and Intact Exudate Amount: Small Exudate Type: Serous Exudate Color: amber Foul Odor After Cleansing: No Slough/Fibrino Yes Wound Bed Granulation Amount: Small (1-33%) Exposed Structure Granulation Quality: Red, Pink Fascia Exposed: No Necrotic Amount: Small (1-33%) Fat Layer (Subcutaneous Tissue) Exposed: Yes Necrotic Quality: Adherent Slough Tendon Exposed: No Muscle Exposed: No Joint Exposed: No Bone Exposed: No Periwound Skin  Texture Texture Color No Abnormalities Noted: Yes No Abnormalities Noted: No Erythema: Yes Moisture Erythema Change: No Change No Abnormalities Noted: No Dry / Scaly: Yes Temperature / Pain Maceration: No Temperature: Hot Treatment Notes Wound #56 (Foot) Wound Laterality: Dorsal, Left Cleanser Soap and Water Discharge Instruction: May shower and wash wound with dial antibacterial soap and water prior to dressing change. Wound Cleanser Discharge Instruction: Cleanse the wound with wound cleanser prior to applying [name] clean dressing using gauze sponges, not tissue or cotton balls. Peri-Wound Care Sween Lotion (Moisturizing lotion) Discharge Instruction: Apply Aquaphor moisturizing lotion as directed Topical Primary Dressing Sorbalgon AG Dressing 2x2 (in/in) Discharge Instruction: Apply to wound bed as instructed Secondary Dressing Woven Gauze Sponge, Non-Sterile 4x4 in Discharge Instruction: Apply over primary dressing as directed. Secured With [employer], 4.5x3.1 (in/yd) Discharge Instruction: Secure with  Kerlix as directed. 52M Medipore H Soft Cloth Surgical Tape, 4 x 10 (in/yd) Salsbury, Acie [name] ([id number]) [id number] Page 10 of 17 Discharge Instruction: Secure with tape as directed. Compression Wrap Compression Stockings Add-Ons Electronic Signature(s) Signed: 11/24/2022 4:35:26 PM By: Adline Peals Entered By: Adline Peals on 11/24/2022 09:48:25 -------------------------------------------------------------------------------- Wound Assessment Details Patient Name: Date of Service: [name], [name] 11/24/2022 9:15 [name] Medical Record Number: [id number] Patient Account Number: [id number] Date of Birth/Sex: Treating RN: [birth year] (37 y.o. [name] Primary Care Naimah Yingst: [place], [place] Other Clinician: Referring Brandley Aldrete: Treating Rose-Marie Hickling/Extender: [name], [name] [name] in Treatment: 359 Wound Status Wound  Number: 65 Primary Etiology: Pressure Ulcer Wound Location: Left Upper Leg Wound Status: Open Wounding Event: Pressure Injury Comorbid History: Sleep Apnea, Hypertension, Paraplegia Date Acquired: 05/24/2022 [name] Of Treatment: 25 Clustered Wound: No Photos Photo Uploaded By: Donavan Burnet on 11/24/2022 17:26:11 Wound Measurements Length: (cm) 8 Width: (cm) 3 Depth: (cm) 0.1 Area: (cm) 18.85 Volume: (cm) 1.885 % Reduction in Area: -200% % Reduction in Volume: -200.2% Epithelialization: Small (1-33%) Tunneling: No Undermining: No Wound Description Classification: Category/Stage III Wound Margin: Flat and Intact Exudate Amount: Large Exudate Type: Serosanguineous Exudate Color: red, brown Foul Odor After Cleansing: No Slough/Fibrino Yes Wound Bed Granulation Amount: Large (67-100%) Exposed Structure Granulation Quality: Red, Friable Fascia Exposed: No Necrotic Amount: Small (1-33%) Fat Layer (Subcutaneous Tissue) Exposed: Yes Necrotic Quality: Eschar, Adherent Slough Tendon Exposed: No Muscle Exposed: No Joint Exposed: No Bone Exposed: No Radke, Johntavious [name] ([id number]) [id number] Page 11 of 17 Periwound Skin Texture Texture Color No Abnormalities Noted: No No Abnormalities Noted: Yes Excoriation: Yes Temperature / Pain Scarring: Yes Temperature: No Abnormality Moisture No Abnormalities Noted: No Dry / Scaly: Yes Maceration: Yes Treatment Notes Wound #65 (Upper Leg) Wound Laterality: Left Cleanser Soap and Water Discharge Instruction: May shower and wash wound with dial antibacterial soap and water prior to dressing change. Wound Cleanser Discharge Instruction: Cleanse the wound with wound cleanser prior to applying [name] clean dressing using gauze sponges, not tissue or cotton balls. Peri-Wound Care Zinc Oxide Ointment 30g tube Discharge Instruction: Apply Zinc Oxide to periwound with each dressing change Topical Keystone antibiotic  compound Discharge Instruction: thin layer to wound bed, use daily once available Primary Dressing Cutimed Sorbact Swab Discharge Instruction: Apply to wound bed as instructed Secondary Dressing Zetuvit Plus Silicone Border Dressing 7x7(in/in) Discharge Instruction: Apply silicone border over primary dressing as directed. Secured With Compression Wrap Compression Stockings Environmental education officer) Signed: 11/24/2022 4:35:26 PM By: Adline Peals Entered By: Adline Peals on 11/24/2022 09:51:48 -------------------------------------------------------------------------------- Wound Assessment Details Patient Name: Date of Service: [name], [name] 11/24/2022 9:15 [name] Medical Record Number: [id number] Patient Account Number: [id number] Date of Birth/Sex: Treating RN: 11-25-1985 (37 y.o. [name] Primary Care Akaysha Cobern: [place], [place] Other Clinician: Referring Dollye Glasser: Treating Juliane Guest/Extender: [name], [name] [name] in Treatment: 359 Wound Status Wound Number: 66 Primary Etiology: Venous Leg Ulcer Wound Location: Right, Anterior Ankle Wound Status: Open Wounding Event: Pressure Injury Comorbid History: Sleep Apnea, Hypertension, Paraplegia Date Acquired: 06/15/2022 [name] Of Treatment: 23 Clustered Wound: No Photos Baptista, Kainen [name] ([id number]) [id number] Page 12 of 17 Photo Uploaded By: Donavan Burnet on 11/24/2022 17:26:11 Wound Measurements Length: (cm) 0.1 Width: (cm) 0.1 Depth: (cm) 0.1 Area: (cm) 0.008 Volume: (cm) 0.001 % Reduction in Area: 99.4% % Reduction in Volume: 99.2% Epithelialization: Large (67-100%) Tunneling: No Undermining: No Wound Description Classification: Full Thickness Without Exposed Support Structures Wound Margin: Flat and Intact  Exudate Amount: Small Exudate Type: Serosanguineous Exudate Color: red, brown Foul Odor After Cleansing: No Slough/Fibrino Yes Wound  Bed Granulation Amount: Small (1-33%) Exposed Structure Granulation Quality: Red Fascia Exposed: No Necrotic Amount: Large (67-100%) Fat Layer (Subcutaneous Tissue) Exposed: Yes Necrotic Quality: Eschar Tendon Exposed: No Muscle Exposed: No Joint Exposed: No Bone Exposed: No Periwound Skin Texture Texture Color No Abnormalities Noted: Yes No Abnormalities Noted: No Erythema: Yes Moisture Erythema Measurement: Measured No Abnormalities Noted: Yes 1.5 cm Temperature / Pain Temperature: No Abnormality Treatment Notes Wound #66 (Ankle) Wound Laterality: Right, Anterior Cleanser Soap and Water Discharge Instruction: May shower and wash wound with dial antibacterial soap and water prior to dressing change. Wound Cleanser Discharge Instruction: Cleanse the wound with wound cleanser prior to applying [name] clean dressing using gauze sponges, not tissue or cotton balls. Peri-Wound Care Sween Lotion (Moisturizing lotion) Discharge Instruction: Apply Aquaphor moisturizing lotion as directed Topical Primary Dressing Sorbalgon AG Dressing 2x2 (in/in) Discharge Instruction: Apply to wound bed as instructed Secondary Dressing Secured With Kerlix Roll Sterile, 4.5x3.1 (in/yd) Discharge Instruction: Secure with Kerlix as directed. 35M Medipore H Soft Cloth Surgical T ape, 4 x 10 (in/yd) Discharge Instruction: Secure with tape as directed. Rominger, HALDON CARLEY ([id number]) [id number] Page 13 of 17 Compression Wrap Compression Stockings Add-Ons Electronic Signature(s) Signed: 11/24/2022 4:35:26 PM By: Adline Peals Entered By: Adline Peals on 11/24/2022 09:49:04 -------------------------------------------------------------------------------- Wound Assessment Details Patient Name: Date of Service: [name], [name] 11/24/2022 9:15 [name] Medical Record Number: [id number] Patient Account Number: [id number] Date of Birth/Sex: Treating RN: [birth year] (37 y.o. [name] Primary Care Praneel Haisley: [place], [place] Other Clinician: Referring Ellyse Rotolo: Treating Louellen Haldeman/Extender: [name], [name] [name] in Treatment: 359 Wound Status Wound Number: 67 Primary Etiology: Venous Leg Ulcer Wound Location: Left, Lateral Lower Leg Wound Status: Open Wounding Event: Gradually Appeared Comorbid History: Sleep Apnea, Hypertension, Paraplegia Date Acquired: 06/22/2022 [name] Of Treatment: 21 Clustered Wound: No Photos Photo Uploaded By: Donavan Burnet on 11/24/2022 17:26:43 Wound Measurements Length: (cm) 3.8 Width: (cm) 0.5 Depth: (cm) 0.1 Area: (cm) 1.492 Volume: (cm) 0.149 % Reduction in Area: 73.4% % Reduction in Volume: 73.4% Epithelialization: Medium (34-66%) Tunneling: No Undermining: No Wound Description Classification: Full Thickness Without Exposed Suppo Wound Margin: Distinct, outline attached Exudate Amount: Medium Exudate Type: Serosanguineous Exudate Color: red, brown rt Structures Foul Odor After Cleansing: No Slough/Fibrino No Wound Bed Granulation Amount: Large (67-100%) Exposed Structure Granulation Quality: Red Fascia Exposed: No Necrotic Amount: Small (1-33%) Fat Layer (Subcutaneous Tissue) Exposed: Yes Necrotic Quality: Eschar Tendon Exposed: No Muscle Exposed: No Joint Exposed: No Bone Exposed: No Periwound Skin Texture Brotzman, Florencio [name] ([id number]) [id number] Page 14 of 17 Texture Color No Abnormalities Noted: Yes No Abnormalities Noted: No Erythema: Yes Moisture Erythema Location: Red Streaks No Abnormalities Noted: No Dry / Scaly: Yes Temperature / Pain Temperature: No Abnormality Treatment Notes Wound #67 (Lower Leg) Wound Laterality: Left, Lateral Cleanser Soap and Water Discharge Instruction: May shower and wash wound with dial antibacterial soap and water prior to dressing change. Wound Cleanser Discharge Instruction: Cleanse the wound with wound  cleanser prior to applying [name] clean dressing using gauze sponges, not tissue or cotton balls. Peri-Wound Care Triamcinolone 15 (g) Discharge Instruction: Use triamcinolone 15 (g) as directed Sween Lotion (Moisturizing lotion) Discharge Instruction: Apply Aquaphor moisturizing lotion as directed Topical Mupirocin Ointment Discharge Instruction: Apply Mupirocin (Bactroban) as instructed Primary Dressing Sorbalgon AG Dressing 2x2 (in/in) Discharge Instruction: Apply to wound bed as instructed Secondary Dressing Woven Gauze Sponge,  Non-Sterile 4x4 in Discharge Instruction: Apply over primary dressing as directed. Secured With [employer], 4.5x3.1 (in/yd) Discharge Instruction: Secure with Kerlix as directed. 1M Medipore H Soft Cloth Surgical T ape, 4 x 10 (in/yd) Discharge Instruction: Secure with tape as directed. Compression Wrap Compression Stockings Add-Ons Electronic Signature(s) Signed: 11/24/2022 4:35:26 PM By: Adline Peals Entered By: Adline Peals on 11/24/2022 09:49:36 -------------------------------------------------------------------------------- Wound Assessment Details Patient Name: Date of Service: [name], [name] 11/24/2022 9:15 [name] Medical Record Number: [id number] Patient Account Number: [id number] Date of Birth/Sex: Treating RN: 09-28-1986 (37 y.o. [name] Primary Care Tayvian Holycross: [place], [place] Other Clinician: Referring Mekai Wilkinson: Treating Aidan Caloca/Extender: [name], [name] [name] in Treatment: 359 Wound Status Wound Number: 69 Primary Etiology: T be determined o Wound Location: Right, Plantar Foot Wound Status: Healed - Epithelialized Wounding Event: Gradually Appeared Comorbid History: Sleep Apnea, Hypertension, Paraplegia Date Acquired: 10/26/2022 [name] Of Treatment: 4 Gerstel, Zayquan [name] ([id number]) [id number] Page 15 of 17 Clustered Wound: No Photos Photo Uploaded By: Donavan Burnet on  11/24/2022 17:26:43 Wound Measurements Length: (cm) Width: (cm) Depth: (cm) Area: (cm) Volume: (cm) 0 % Reduction in Area: 100% 0 % Reduction in Volume: 100% 0 Epithelialization: Large (67-100%) 0 Tunneling: No 0 Undermining: No Wound Description Classification: Full Thickness Without Exposed Suppo Wound Margin: Flat and Intact Exudate Amount: None Present rt Structures Foul Odor After Cleansing: No Slough/Fibrino No Wound Bed Granulation Amount: None Present (0%) Exposed Structure Necrotic Amount: None Present (0%) Fascia Exposed: No Fat Layer (Subcutaneous Tissue) Exposed: No Tendon Exposed: No Muscle Exposed: No Joint Exposed: No Bone Exposed: No Periwound Skin Texture Texture Color No Abnormalities Noted: No No Abnormalities Noted: Yes Callus: Yes Temperature / Pain Temperature: No Abnormality Moisture No Abnormalities Noted: No Dry / Scaly: Yes Electronic Signature(s) Signed: 11/24/2022 4:35:26 PM By: Adline Peals Entered By: Adline Peals on 11/24/2022 09:58:21 -------------------------------------------------------------------------------- Wound Assessment Details Patient Name: Date of Service: [name], [name] 11/24/2022 9:15 [name] Medical Record Number: [id number] Patient Account Number: [id number] Date of Birth/Sex: Treating RN: Jul 12, 1986 (37 y.o. [name] Primary Care Tiki Tucciarone: [place], [place] Other Clinician: Referring Bella Brummet: Treating Kiyanna Biegler/Extender: [name], [name] [name] in Treatment: 359 Wound Status Wound Number: 70 Primary Etiology: T be determined o Wound Location: Left, Plantar Foot Wound Status: Open Wounding Event: Gradually Appeared Comorbid History: Sleep Apnea, Hypertension, Paraplegia Date Acquired: 10/26/2022 [name] Of Treatment: 4 Fiscus, Sayid [name] ([id number]) [id number] Page 16 of 17 Clustered Wound: No Photos Photo Uploaded By: Donavan Burnet on 11/24/2022  17:26:43 Wound Measurements Length: (cm) Width: (cm) Depth: (cm) Area: (cm) Volume: (cm) 0 % Reduction in Area: 100% 0 % Reduction in Volume: 100% 0 Epithelialization: Large (67-100%) 0 Tunneling: No 0 Undermining: No Wound Description Classification: Full Thickness Without Exposed Support Structures Wound Margin: Flat and Intact Exudate Amount: None Present Foul Odor After Cleansing: No Slough/Fibrino No Wound Bed Granulation Amount: None Present (0%) Exposed Structure Necrotic Amount: None Present (0%) Fascia Exposed: No Fat Layer (Subcutaneous Tissue) Exposed: No Tendon Exposed: No Muscle Exposed: No Joint Exposed: No Bone Exposed: No Periwound Skin Texture Texture Color No Abnormalities Noted: No No Abnormalities Noted: Yes Callus: Yes Temperature / Pain Temperature: No Abnormality Moisture No Abnormalities Noted: Yes Electronic Signature(s) Signed: 11/24/2022 4:35:26 PM By: Adline Peals Entered By: Adline Peals on 11/24/2022 09:58:54 -------------------------------------------------------------------------------- Vitals Details Patient Name: Date of Service: Prew, [name] 11/24/2022 9:15 [name] Medical Record Number: [id number] Patient Account Number: [id number] Date of Birth/Sex: Treating RN: [birth year] (  37 y.o. [name] Primary Care Denica Web: [place], [place] Other Clinician: Referring Marialuisa Basara: Treating Kainen Struckman/Extender: [name], [name] [name] in Treatment: 359 Vital Signs Time Taken: 09:39 Temperature (F): 98.3 Height (in): 70 Pulse (bpm): 132 Weight (lbs): 216 Respiratory Rate (breaths/min): 18 Body Mass Index (BMI): 31 Blood Pressure (mmHg): 131/84 Reference Range: 80 - 120 mg / dl Albee, Kourtland [name] ([id number]) [id number].pdf Page 17 of 17 Electronic Signature(s) Signed: 11/24/2022 4:35:26 PM By: Adline Peals Entered By: Adline Peals on 11/24/2022 09:39:56

## 2022-12-01 ENCOUNTER — Ambulatory Visit (INDEPENDENT_AMBULATORY_CARE_PROVIDER_SITE_OTHER): Payer: BC Managed Care – PPO | Admitting: Behavioral Health

## 2022-12-01 ENCOUNTER — Encounter: Payer: Self-pay | Admitting: Behavioral Health

## 2022-12-01 DIAGNOSIS — G4733 Obstructive sleep apnea (adult) (pediatric): Secondary | ICD-10-CM | POA: Diagnosis not present

## 2022-12-01 DIAGNOSIS — F341 Dysthymic disorder: Secondary | ICD-10-CM | POA: Diagnosis not present

## 2022-12-01 NOTE — Progress Notes (Signed)
[facility]  Patient ID: BUSH MURDOCH,  MRN: [id number]  PCP: Janine Limbo, PA-C  Date of Evaluation: 12/01/2022 Time spent:30 minutes  Chief Complaint:  Chief Complaint   Depression; Anxiety; Follow-up; Patient Education; Medication Refill     HISTORY/CURRENT STATUS: HPI   "Seyon", 37 year old male presents to this office for follow up and medication management. Says he feels like he has been becoming more depressed over the last couple of months with seasonal changes. Says that he is feeling much better and his anxiety and depression have significantly improved since increasing his zoloft.  He is requesting no medication changes  this visit and would like to continue to monitor. He reports 1/10 depression, and 0/10 anxiety. He sleeps 7 plus hours per night. Denies any significant social changes. He is wheelchair bound and has non healing wounds on both legs bilaterally. He goes to wound care once weekly. No changes in his social life. He denies mania, no psychosis. No SI/HI.    No prior psychiatric medication failures   Individual Medical History/ Review of Systems: Changes? :No   Allergies: Sulfa antibiotics, Levofloxacin, Meropenem, Penicillins, and Zyvox [linezolid]  Current Medications:  Current Outpatient Medications:    Amino Acids-Protein Hydrolys (FEEDING SUPPLEMENT, PRO-STAT SUGAR FREE 64,) LIQD, Take 30 mLs by mouth 2 (two) times daily., Disp: , Rfl:    Ascorbic Acid (VITAMIN C) 1000 MG tablet, Take 1,000 mg by mouth daily., Disp: , Rfl:    baclofen (LIORESAL) 20 MG tablet, Take 40-60 mg by mouth 3 (three) times daily. '60mg'$  in the morning, '40mg'$  in the afternoon, and '60mg'$  in the evening, Disp: , Rfl:    imipramine (TOFRANIL) 50 MG tablet, Take 50 mg by mouth at bedtime. , Disp: , Rfl:    lisinopril-hydrochlorothiazide (PRINZIDE,ZESTORETIC) 20-25 MG per tablet, Take 1 tablet by mouth daily., Disp: , Rfl:    Melatonin 10 MG CAPS, Take by mouth. Nightly, Disp:  , Rfl:    Multiple Vitamin (MULTIVITAMIN) tablet, Take 1 tablet by mouth daily., Disp: , Rfl:    Omadacycline Tosylate (NUZYRA) 150 MG TABS, Take 3 tablets by mouth daily for 2 days, then take 2 tablets by mouth daily for 6 days., Disp: 18 tablet, Rfl: 0   oxybutynin (DITROPAN XL) 15 MG 24 hr tablet, Take 30 mg by mouth at bedtime. , Disp: , Rfl:    sertraline (ZOLOFT) 100 MG tablet, TAKE 1 TABLET(100 MG) BY MOUTH DAILY AFTER BREAKFAST, Disp: 30 tablet, Rfl: 4   sertraline (ZOLOFT) 100 MG tablet, Take 1.5 tablets (150 mg total) by mouth daily., Disp: 45 tablet, Rfl: 2   zinc sulfate 220 (50 Zn) MG capsule, Take 220 mg by mouth daily., Disp: , Rfl:  Medication Side Effects: none  Family Medical/ Social History: Changes? No  MENTAL HEALTH EXAM:  There were no vitals taken for this visit.There is no height or weight on file to calculate BMI.  General Appearance: Casual, Neat, Well Groomed, and in wheelchair  Eye Contact:  Fair  Speech:  Clear and Coherent  Volume:  Normal  Mood:  NA  Affect:  Congruent  Thought Process:  Coherent  Orientation:  Full (Time, Place, and Person)  Thought Content: Logical   Suicidal Thoughts:  No  Homicidal Thoughts:  No  Memory:  WNL  Judgement:  Good  Insight:  Good  Psychomotor Activity:  Normal  Concentration:  Concentration: Good  Recall:  Good  Fund of Knowledge: Good  Language: Good  Assets:  Desire for Improvement  ADL's:  Intact  Cognition: WNL  Prognosis:  Good    DIAGNOSES: No diagnosis found.  Receiving Psychotherapy: No    RECOMMENDATIONS:    Greater than 50% of  30 min face to face time with patient was spent on counseling and coordination of care. We discussed his recent significant improvement since his last visit.  He is very happy with how the increase of Zoloft has been working to reduce his anxiety and depression.  Will continue Zoloft to 150 mg  daily.  Will report worsening symptoms promptly Will follow up in 3 months  to reassess Provided emergency contact information Reviewed [place], NP

## 2022-12-07 ENCOUNTER — Encounter (HOSPITAL_BASED_OUTPATIENT_CLINIC_OR_DEPARTMENT_OTHER): Payer: BC Managed Care – PPO | Admitting: General Surgery

## 2022-12-07 DIAGNOSIS — G8220 Paraplegia, unspecified: Secondary | ICD-10-CM | POA: Diagnosis not present

## 2022-12-07 DIAGNOSIS — L97521 Non-pressure chronic ulcer of other part of left foot limited to breakdown of skin: Secondary | ICD-10-CM | POA: Diagnosis not present

## 2022-12-07 DIAGNOSIS — L97511 Non-pressure chronic ulcer of other part of right foot limited to breakdown of skin: Secondary | ICD-10-CM | POA: Diagnosis not present

## 2022-12-07 DIAGNOSIS — L98491 Non-pressure chronic ulcer of skin of other sites limited to breakdown of skin: Secondary | ICD-10-CM | POA: Diagnosis not present

## 2022-12-07 DIAGNOSIS — L97312 Non-pressure chronic ulcer of right 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: Secondary | ICD-10-CM | POA: Diagnosis not present

## 2022-12-07 DIAGNOSIS — I87332 Chronic venous hypertension (idiopathic) with ulcer and inflammation of left lower extremity: Secondary | ICD-10-CM | POA: Diagnosis not present

## 2022-12-07 DIAGNOSIS — L97322 Non-pressure chronic ulcer of left 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: Secondary | ICD-10-CM | POA: Diagnosis not present

## 2022-12-07 DIAGNOSIS — L89323 Pressure ulcer of left buttock, stage 3: Secondary | ICD-10-CM | POA: Diagnosis not present

## 2022-12-07 NOTE — Progress Notes (Signed)
[name] [name] ([id number]) [id number] Page 1 of 11 Visit Report for 12/07/2022 Arrival Information Details Patient Name: Date of Service: [name] [name] [name] 12/07/2022 8:30 [name] [name] Medical Record Number: [id number] Patient Account Number: [id number] Date of Birth/Sex: Treating RN: Apr 03, 1986 (37 y.o. [name] [name] Primary Care [name] [name]: [place], [place] Other Clinician: Referring [name] [name]: Treating [name] [name]/Extender: [name] [name] in Treatment: 52 Visit Information History Since Last Visit Added or deleted any medications: Yes Patient Arrived: Wheel Chair Any new allergies or adverse reactions: No Arrival Time: 08:37 Had [name] fall or experienced change in No Accompanied By: self activities of daily living that may affect Transfer Assistance: None risk of falls: Patient Identification Verified: Yes Signs or symptoms of abuse/neglect since last visito No Secondary Verification Process Completed: Yes Hospitalized since last visit: No Patient Requires Transmission-Based Precautions: No Implantable device outside of the clinic excluding No Patient Has Alerts: Yes cellular tissue based products placed in the center Patient Alerts: R ABI = 1.0 since last visit: L ABI = 1.1 Has Dressing in Place as Prescribed: Yes Has Compression in Place as Prescribed: Yes Pain Present Now: No Electronic Signature(s) Signed: 12/07/2022 4:26:59 PM By: [name] Gouty RN, BSN Entered By: [name] [name] on 12/07/2022 08:37:47 -------------------------------------------------------------------------------- Lower Extremity Assessment Details Patient Name: Date of Service: [name] [name] [name] 12/07/2022 8:30 [name] [name] Medical Record Number: [id number] Patient Account Number: [id number] Date of Birth/Sex: Treating RN: 08/19/1986 (37 y.o. [name] [name] Primary Care [name] [name]: [place], [name] Other Clinician: Referring Thane Age: Treating [name] [name]/Extender: [name] [name], [name] [name] in Treatment: 361 Edema Assessment Assessed: [Left: No] [Right: No] Edema: [Left: Yes] [Right: Yes] Calf Left: Right: Point of Measurement: 33 cm From Medial Instep 30.3 cm 35 cm Ankle Left: Right: Point of Measurement: 10 cm From Medial Instep 25.4 cm 24.3 cm Vascular Assessment Pulses: Dorsalis Pedis Palpable: [Left:Yes] [Right:Yes [id number] Page 2 of 11] Electronic Signature(s) Signed: 12/07/2022 4:26:59 PM By: [name] Gouty RN, BSN Entered By: [name] [name] on 12/07/2022 08:48:42 -------------------------------------------------------------------------------- Multi Wound Chart Details Patient Name: Date of Service: [name] [name] [name] 12/07/2022 8:30 [name] [name] Medical Record Number: [id number] Patient Account Number: [id number] Date of Birth/Sex: Treating RN: Jan 30, 1986 (37 y.o. [name]) Primary Care [name] [name]: [name] [name] Other Clinician: Referring [name] [name]: Treating [name] [name]/Extender: [name] [name], [name] [name] in Treatment: 361 Vital Signs Height(in): 70 Pulse(bpm): 123 Weight(lbs): 216 Blood Pressure(mmHg): 130/77 Body Mass Index(BMI): 31 Temperature(F): 98. Respiratory Rate(breaths/min): 18 [52:Photos:] Right, Dorsal Foot Left, Dorsal Foot Left Upper Leg Wound Location: Gradually Appeared Gradually Appeared Pressure Injury Wounding Event: Trauma, Other Neuropathic Ulcer-Non Diabetic Pressure Ulcer Primary Etiology: Sleep Apnea, Hypertension, Paraplegia Sleep Apnea, Hypertension, Paraplegia Sleep Apnea, Hypertension, Paraplegia Comorbid History: 03/27/2021 07/11/2021 05/24/2022 Date Acquired: 88 72 27 [name] of Treatment: Open Open Open Wound Status: No No No Wound Recurrence: No Yes No Clustered Wound: 0.9x3.4x0.1 2.5x0.6x0.1 6x2x0.1 Measurements L x W x D (cm) 2.403 1.178 9.425 [name] (cm) : rea 0.24 0.118 0.942 Volume (cm) : -27.50% 81.10% -50.00% % Reduction in [name] rea: -27.70% 81.00% -50.00% %  Reduction in Volume: Full Thickness Without Exposed Full Thickness Without Exposed Category/Stage III Classification: Support Structures Support Structures Medium Small Medium Exudate [name] mount: Serosanguineous Serosanguineous Serosanguineous Exudate Type: red, brown red, brown red, brown Exudate Color: Distinct, outline attached Flat and Intact Flat and Intact Wound Margin: Large (67-100%) Large (67-100%) Large (67-100%) Granulation [name] mount: Red Red, Pink Red, Friable Granulation Quality: Small (1-33%) None Present (0%) Small (1-33%) Necrotic [name] mount: Fat Layer (Subcutaneous  Tissue): Yes Fat Layer (Subcutaneous Tissue): Yes Fat Layer (Subcutaneous Tissue): Yes Exposed Structures: Fascia: No Fascia: No Fascia: No Tendon: No Tendon: No Tendon: No Muscle: No Muscle: No Muscle: No Joint: No Joint: No Joint: No Bone: No Bone: No Bone: No Small (1-33%) Large (67-100%) Medium (34-66%) Epithelialization: Debridement - Selective/Open Wound N/[name] N/[name] Debridement: Pre-procedure Verification/Time Out 09:10 N/[name] N/[name] Taken: Necrotic/Eschar, Slough N/[name] N/[name] Tissue Debrided: Non-Viable Tissue N/[name] N/[name] Level: 3.06 N/[name] N/[name] Debridement [name] (sq cm): rea Curette N/[name] N/[name] Instrument: Minimum N/[name] N/[name] Bleeding: Bostrom, Abshir E ([id number]) [id number] Page 3 of 11 Pressure N/[name] N/[name] Hemostasis Achieved: Insensate N/[name] N/[name] Procedural Pain: Insensate N/[name] N/[name] Post Procedural Pain: Procedure was tolerated well N/[name] N/[name] Debridement Treatment Response: 0.9x3.4x0.1 N/[name] N/[name] Post Debridement Measurements L x W x D (cm) 0.24 N/[name] N/[name] Post Debridement Volume: (cm) No Abnormalities Noted No Abnormalities Noted Excoriation: Yes Periwound Skin Texture: Scarring: Yes Dry/Scaly: Yes Dry/Scaly: Yes Dry/Scaly: Yes Periwound Skin Moisture: Maceration: No Maceration: No Erythema: Yes Erythema: Yes No Abnormalities Noted Periwound Skin Color: N/[name] N/[name] N/[name] Erythema  Location: Measured: 3cm Measured: 6cm N/[name] Erythema Measurement: N/[name] No Change N/[name] Erythema Change: No Abnormality No Abnormality No Abnormality Temperature: Debridement N/[name] N/[name] Procedures Performed: Wound Number: [id number] N/[name] Photos: N/[name] Right, Anterior Ankle Left, Lateral Lower Leg N/[name] Wound Location: Pressure Injury Gradually Appeared N/[name] Wounding Event: Venous Leg Ulcer Venous Leg Ulcer N/[name] Primary Etiology: Sleep Apnea, Hypertension, Paraplegia Sleep Apnea, Hypertension, Paraplegia N/[name] Comorbid History: 06/15/2022 06/22/2022 N/[name] Date Acquired: 25 23 N/[name] [name] of Treatment: Open Open N/[name] Wound Status: No No N/[name] Wound Recurrence: No No N/[name] Clustered Wound: 0.7x0.5x0.1 1x0.6x0.1 N/[name] Measurements L x W x D (cm) 0.275 0.471 N/[name] [name] (cm) : rea 0.027 0.047 N/[name] Volume (cm) : 78.40% 91.60% N/[name] % Reduction in [name] rea: 78.70% 91.60% N/[name] % Reduction in Volume: Full Thickness Without Exposed Full Thickness Without Exposed N/[name] Classification: Support Structures Support Structures Medium Medium N/[name] Exudate [name] mount: Serosanguineous Serosanguineous N/[name] Exudate Type: red, brown red, brown N/[name] Exudate Color: Flat and Intact Distinct, outline attached N/[name] Wound Margin: Medium (34-66%) Large (67-100%) N/[name] Granulation [name] mount: Red Red N/[name] Granulation Quality: Medium (34-66%) Small (1-33%) N/[name] Necrotic [name] mount: Fat Layer (Subcutaneous Tissue): Yes Fat Layer (Subcutaneous Tissue): Yes N/[name] Exposed Structures: Fascia: No Fascia: No Tendon: No Tendon: No Muscle: No Muscle: No Joint: No Joint: No Bone: No Bone: No Small (1-33%) Large (67-100%) N/[name] Epithelialization: Debridement - Selective/Open Wound Debridement - Selective/Open Wound N/[name] Debridement: Pre-procedure Verification/Time Out 09:10 09:10 N/[name] Taken: Psychologist, prison and probation services, [employer] N/[name] Tissue Debrided: Non-Viable Tissue Skin/Epidermis N/[name] Level: 0.35 0.6 N/[name] Debridement [name] (sq cm): rea Curette Curette  N/[name] Instrument: Minimum Minimum N/[name] Bleeding: Pressure Pressure N/[name] Hemostasis [name] chieved: Insensate Insensate N/[name] Procedural Pain: Insensate Insensate N/[name] Post Procedural Pain: Procedure was tolerated well Procedure was tolerated well N/[name] Debridement Treatment Response: 0.7x0.5x0.1 1x0.6x0.1 N/[name] Post Debridement Measurements L x W x D (cm) 0.027 0.047 N/[name] Post Debridement Volume: (cm) No Abnormalities Noted No Abnormalities Noted N/[name] Periwound Skin Texture: Dry/Scaly: Yes Dry/Scaly: Yes N/[name] Periwound Skin Moisture: Erythema: Yes Erythema: Yes N/[name] Periwound Skin Color: N/[name] Red Streaks N/[name] Erythema Location: Measured: 1.5cm N/[name] N/[name] Erythema Measurement: No Change No Change N/[name] Erythema Change: No Abnormality No Abnormality N/[name] Temperature: Debridement Debridement N/[name] Procedures Performed: Treatment Notes Ortez, Sherrel E ([id number]) [id number] Page 4 of 11 Electronic Signature(s) Signed: 12/07/2022 9:32:23 AM By: Fredirick Maudlin MD FACS Entered By: Fredirick Maudlin on 12/07/2022 09:32:23 --------------------------------------------------------------------------------  Multi-Disciplinary Care Plan Details Patient Name: Date of Service: [name] [name] [name] 12/07/2022 8:30 [name] [name] Medical Record Number: [id number] Patient Account Number: [id number] Date of Birth/Sex: Treating RN: [birth year] (37 y.o. [name] [name] Primary Care Pebbles Zeiders: [name] [name] Other Clinician: Referring Bearl Talarico: Treating Kynlie Jane/Extender: [name] [name] in Treatment: [place] reviewed with physician Active Inactive Pressure Nursing Diagnoses: Knowledge deficit related to management of pressures ulcers Potential for impaired tissue integrity related to pressure, friction, moisture, and shear Goals: Patient will remain free from development of additional pressure ulcers Date Initiated: 06/21/2018 Date Inactivated:  02/12/2019 Target Resolution Date: 02/16/2019 Unmet Reason: pressure ulcer Goal Status: Unmet reopened on left ischium Patient/caregiver will verbalize risk factors for pressure ulcer development Date Initiated: 06/21/2018 Date Inactivated: 05/28/2019 Target Resolution Date: 06/01/2019 Goal Status: Met Patient/caregiver will verbalize understanding of pressure ulcer management Date Initiated: 06/01/2022 Target Resolution Date: 12/24/2022 Goal Status: Active Interventions: Assess: immobility, friction, shearing, incontinence upon admission and as needed Assess offloading mechanisms upon admission and as needed Assess potential for pressure ulcer upon admission and as needed Treatment Activities: Pressure reduction/relief device ordered : 12/05/2017 Notes: Wound/Skin Impairment Nursing Diagnoses: Impaired tissue integrity Knowledge deficit related to ulceration/compromised skin integrity Goals: Patient/caregiver will verbalize understanding of skin care regimen Date Initiated: 06/01/2022 Target Resolution Date: 12/24/2022 Goal Status: Active Ulcer/skin breakdown will have [name] volume reduction of 30% by week 4 Date Initiated: 06/01/2022 Date Inactivated: 06/29/2022 Target Resolution Date: 06/29/2022 Unmet Reason: complex wounds, Goal Status: Unmet infection Interventions: Assess patient/caregiver ability to obtain necessary supplies Assess ulceration(s) every visit Provide education on ulcer and skin care Notes: Babilonia, Armas E ([id number]) [id number] Page 5 of 11 02/02/21: Complex Care, ongoing. Electronic Signature(s) Signed: 12/07/2022 4:26:59 PM By: [name] Gouty RN, BSN Entered By: [name] [name] on 12/07/2022 09:11:16 -------------------------------------------------------------------------------- Pain Assessment Details Patient Name: Date of Service: [name] [name] [name] 12/07/2022 8:30 [name] [name] Medical Record Number: [id number] Patient Account Number: [id number] Date  of Birth/Sex: Treating RN: 14-Dec-1985 (37 y.o. [name] [name] Primary Care Rayonna Heldman: [place], [place] Other Clinician: Referring Ashleynicole Mcclees: Treating Mallery Harshman/Extender: [name] [name] in Treatment: 361 Active Problems Location of Pain Severity and Description of Pain Patient Has Paino No Site Locations Rate the pain. Current Pain Level: 0 Pain Management and Medication Current Pain Management: Electronic Signature(s) Signed: 12/07/2022 4:26:59 PM By: [name] Gouty RN, BSN Entered By: [name] [name] on 12/07/2022 08:41:20 -------------------------------------------------------------------------------- Patient/Caregiver Education Details Patient Name: Date of Service: [name] [name] Viviann Spare 1/16/2024andnbsp8:30 [name] [name] Medical Record Number: [id number] Patient Account Number: [id number] Date of Birth/Gender: Treating RN: [birth year] (37 y.o. [name] [name] Primary Care Physician: Janine Limbo Other Clinician: Referring Physician: Treating Physician/Extender: [name] [name] in Treatment: [address] Education Assessment Ingerson, [name] ([id number]) [id number] Page 6 of 11 Education Provided To: Patient Education Topics Provided Pressure: Methods: Explain/Verbal Responses: Reinforcements needed, State content correctly Wound/Skin Impairment: Methods: Explain/Verbal Responses: Reinforcements needed, State content correctly Electronic Signature(s) Signed: 12/07/2022 4:26:59 PM By: [name] Gouty RN, BSN Entered By: [name] [name] on 12/07/2022 09:11:53 -------------------------------------------------------------------------------- Wound Assessment Details Patient Name: Date of Service: [name] [name] [name] 12/07/2022 8:30 [name] [name] Medical Record Number: [id number] Patient Account Number: [id number] Date of Birth/Sex: Treating RN: 28-Nov-1985 (37 y.o. [name] [name] Primary Care Lastacia Solum: [place], [place] Other Clinician: Referring  Thyra Yinger: Treating Avinash Maltos/Extender: [name] [name], [name] [name] in Treatment: 361 Wound Status Wound Number: [id number] Primary Etiology: Trauma, Other Wound Location: Right, Dorsal Foot Wound Status: Open Wounding Event: Gradually  Appeared Comorbid History: Sleep Apnea, Hypertension, Paraplegia Date Acquired: 03/27/2021 [name] Of Treatment: 88 Clustered Wound: No Photos Wound Measurements Length: (cm) 0.9 Width: (cm) 3.4 Depth: (cm) 0.1 Area: (cm) 2.403 Volume: (cm) 0.24 % Reduction in Area: -27.5% % Reduction in Volume: -27.7% Epithelialization: Small (1-33%) Tunneling: No Undermining: No Wound Description Classification: Full Thickness Without Exposed Suppor Wound Margin: Distinct, outline attached Exudate Amount: Medium Exudate Type: Serosanguineous Exudate Color: red, brown t Structures Foul Odor After Cleansing: No Slough/Fibrino Yes Wound Bed Granulation Amount: Large (67-100%) Exposed Structure Roddey, Ronak E ([id number]) [id number] Page 7 of 11 Granulation Quality: Red Fascia Exposed: No Necrotic Amount: Small (1-33%) Fat Layer (Subcutaneous Tissue) Exposed: Yes Necrotic Quality: Adherent Slough Tendon Exposed: No Muscle Exposed: No Joint Exposed: No Bone Exposed: No Periwound Skin Texture Texture Color No Abnormalities Noted: Yes No Abnormalities Noted: No Erythema: Yes Moisture Erythema Measurement: Measured No Abnormalities Noted: No 3 cm Dry / Scaly: Yes Temperature / Pain Temperature: No Abnormality Electronic Signature(s) Signed: 12/07/2022 4:26:59 PM By: [name] Gouty RN, BSN Entered By: [name] [name] on 12/07/2022 08:59:49 -------------------------------------------------------------------------------- Wound Assessment Details Patient Name: Date of Service: [name] [name] [name] 12/07/2022 8:30 [name] [name] Medical Record Number: [id number] Patient Account Number: [id number] Date of Birth/Sex: Treating RN: 1986/01/29 (37  y.o. [name] [name] Primary Care Jermany Rimel: [place], [place] Other Clinician: Referring Karlea Mckibbin: Treating Kainoah Bartosiewicz/Extender: [name] [name], [name] [name] in Treatment: 361 Wound Status Wound Number: [id number] Primary Etiology: Neuropathic Ulcer-Non Diabetic Wound Location: Left, Dorsal Foot Wound Status: Open Wounding Event: Gradually Appeared Comorbid History: Sleep Apnea, Hypertension, Paraplegia Date Acquired: 07/11/2021 [name] Of Treatment: 72 Clustered Wound: Yes Photos Wound Measurements Length: (cm) 2.5 Width: (cm) 0.6 Depth: (cm) 0.1 Area: (cm) 1.178 Volume: (cm) 0.118 % Reduction in Area: 81.1% % Reduction in Volume: 81% Epithelialization: Large (67-100%) Tunneling: No Undermining: No Wound Description Classification: Full Thickness Without Exposed Support Structures Wound Margin: Flat and Intact Exudate Amount: Small Exudate Type: Serosanguineous Exudate Color: red, brown Guild, Kaeson E ([id number]) Wound Bed Granulation Amount: Large (67-100%) Granulation Quality: Red, Pink Necrotic Amount: None Present (0%) Foul Odor After Cleansing: No Slough/Fibrino Yes [id number] Page 8 of 11 Exposed Structure Fascia Exposed: No Fat Layer (Subcutaneous Tissue) Exposed: Yes Tendon Exposed: No Muscle Exposed: No Joint Exposed: No Bone Exposed: No Periwound Skin Texture Texture Color No Abnormalities Noted: Yes No Abnormalities Noted: No Erythema: Yes Moisture Erythema Measurement: Measured No Abnormalities Noted: No 6 cm Dry / Scaly: Yes Erythema Change: No Change Maceration: No Temperature / Pain Temperature: No Abnormality Electronic Signature(s) Signed: 12/07/2022 4:26:59 PM By: [name] Gouty RN, BSN Entered By: [name] [name] on 12/07/2022 09:08:23 -------------------------------------------------------------------------------- Wound Assessment Details Patient Name: Date of Service: [name] [name] [name] 12/07/2022 8:30 [name]  [name] Medical Record Number: [id number] Patient Account Number: [id number] Date of Birth/Sex: Treating RN: 1986-06-15 (37 y.o. [name] [name] Primary Care Brizeyda Holtmeyer: [name] [name] Other Clinician: Referring Feleica Fulmore: Treating Grantley Savage/Extender: [name] [name], [name] [name] in Treatment: 361 Wound Status Wound Number: 65 Primary Etiology: Pressure Ulcer Wound Location: Left Upper Leg Wound Status: Open Wounding Event: Pressure Injury Comorbid History: Sleep Apnea, Hypertension, Paraplegia Date Acquired: 05/24/2022 [name] Of Treatment: 27 Clustered Wound: No Photos Wound Measurements Length: (cm) 6 Width: (cm) 2 Depth: (cm) 0.1 Area: (cm) 9.425 Volume: (cm) 0.942 % Reduction in Area: -50% % Reduction in Volume: -50% Epithelialization: Medium (34-66%) Tunneling: No Undermining: No Wound Description Classification: Category/Stage III Wound Margin: Flat and Intact Exudate Amount: Medium Igoe, Leon E ([id number]) Exudate Type: Serosanguineous  Exudate Color: red, brown Foul Odor After Cleansing: No Slough/Fibrino Yes [id number].pdf Page 9 of 11 Wound Bed Granulation Amount: Large (67-100%) Exposed Structure Granulation Quality: Red, Friable Fascia Exposed: No Necrotic Amount: Small (1-33%) Fat Layer (Subcutaneous Tissue) Exposed: Yes Necrotic Quality: Adherent Slough Tendon Exposed: No Muscle Exposed: No Joint Exposed: No Bone Exposed: No Periwound Skin Texture Texture Color No Abnormalities Noted: Yes No Abnormalities Noted: Yes Moisture Temperature / Pain No Abnormalities Noted: No Temperature: No Abnormality Dry / Scaly: Yes Maceration: No Electronic Signature(s) Signed: 12/07/2022 4:26:59 PM By: [name] Gouty RN, BSN Entered By: [name] [name] on 12/07/2022 09:02:42 -------------------------------------------------------------------------------- Wound Assessment Details Patient Name: Date of Service: [name] [name] [name]  12/07/2022 8:30 [name] [name] Medical Record Number: [id number] Patient Account Number: [id number] Date of Birth/Sex: Treating RN: Jun 12, 1986 (37 y.o. [name] [name] Primary Care Gearld Kerstein: [place], [place] Other Clinician: Referring Donterrius Santucci: Treating Aviyon Hocevar/Extender: [name] [name], [name] [name] in Treatment: 361 Wound Status Wound Number: 66 Primary Etiology: Venous Leg Ulcer Wound Location: Right, Anterior Ankle Wound Status: Open Wounding Event: Pressure Injury Comorbid History: Sleep Apnea, Hypertension, Paraplegia Date Acquired: 06/15/2022 [name] Of Treatment: 25 Clustered Wound: No Photos Wound Measurements Length: (cm) 0.7 Width: (cm) 0.5 Depth: (cm) 0.1 Area: (cm) 0.275 Volume: (cm) 0.027 % Reduction in Area: 78.4% % Reduction in Volume: 78.7% Epithelialization: Small (1-33%) Tunneling: No Undermining: No Wound Description Classification: Full Thickness Without Exposed Support Structures Wound Margin: Flat and Intact Berryman, Lenon E ([id number]) Exudate Amount: Medium Exudate Type: Serosanguineous Exudate Color: red, brown Foul Odor After Cleansing: No Slough/Fibrino Yes [id number] Page 10 of 11 Wound Bed Granulation Amount: Medium (34-66%) Exposed Structure Granulation Quality: Red Fascia Exposed: No Necrotic Amount: Medium (34-66%) Fat Layer (Subcutaneous Tissue) Exposed: Yes Necrotic Quality: Adherent Slough Tendon Exposed: No Muscle Exposed: No Joint Exposed: No Bone Exposed: No Periwound Skin Texture Texture Color No Abnormalities Noted: Yes No Abnormalities Noted: No Erythema: Yes Moisture Erythema Measurement: Measured No Abnormalities Noted: No 1.5 cm Dry / Scaly: Yes Erythema Change: No Change Temperature / Pain Temperature: No Abnormality Electronic Signature(s) Signed: 12/07/2022 4:26:59 PM By: [name] Gouty RN, BSN Entered By: [name] [name] on 12/07/2022  09:09:07 -------------------------------------------------------------------------------- Wound Assessment Details Patient Name: Date of Service: [name] [name] [name] 12/07/2022 8:30 [name] [name] Medical Record Number: [id number] Patient Account Number: [id number] Date of Birth/Sex: Treating RN: 1986-09-19 (37 y.o. [name] [name] Primary Care Evren Shankland: [name] [name] Other Clinician: Referring Kamry Faraci: Treating Jimmey Hengel/Extender: [name] [name], [name] [name] in Treatment: 361 Wound Status Wound Number: 67 Primary Etiology: Venous Leg Ulcer Wound Location: Left, Lateral Lower Leg Wound Status: Open Wounding Event: Gradually Appeared Comorbid History: Sleep Apnea, Hypertension, Paraplegia Date Acquired: 06/22/2022 [name] Of Treatment: 23 Clustered Wound: No Photos Wound Measurements Length: (cm) 1 Width: (cm) 0.6 Depth: (cm) 0.1 Area: (cm) 0.471 Volume: (cm) 0.047 Bettinger, Gerrad E ([id number]) Wound Description Classification: Full Thickness Without Exposed Support Structures Wound Margin: Distinct, outline attached Exudate Amount: Medium Exudate Type: Serosanguineous Exudate Color: red, brown Foul Odor After Cleansing: No Slough/Fibrino No % Reduction in Area: 91.6% % Reduction in Volume: 91.6% Epithelialization: Large (67-100%) Tunneling: No Undermining: No [id number] Page 11 of 11 Wound Bed Granulation Amount: Large (67-100%) Exposed Structure Granulation Quality: Red Fascia Exposed: No Necrotic Amount: Small (1-33%) Fat Layer (Subcutaneous Tissue) Exposed: Yes Necrotic Quality: Adherent Slough Tendon Exposed: No Muscle Exposed: No Joint Exposed: No Bone Exposed: No Periwound Skin Texture Texture Color No Abnormalities Noted: Yes No Abnormalities Noted: No Erythema: Yes Moisture Erythema Location: Red  Streaks No Abnormalities Noted: No Erythema Change: No Change Dry / Scaly: Yes Temperature / Pain Temperature: No  Abnormality Electronic Signature(s) Signed: 12/07/2022 4:26:59 PM By: [name] Gouty RN, BSN Entered By: [name] [name] on 12/07/2022 96:22:29 -------------------------------------------------------------------------------- Vitals Details Patient Name: Date of Service: [name] [name] [name] 12/07/2022 8:30 [name] [name] Medical Record Number: [id number] Patient Account Number: [id number] Date of Birth/Sex: Treating RN: [birth year] (37 y.o. [name] [name] Primary Care Lizeth Bencosme: [place], [place] Other Clinician: Referring Shamon Lobo: Treating Nalda Shackleford/Extender: [name] [name], [name] [name] in Treatment: 361 Vital Signs Time Taken: 08:37 Temperature (F): 98. Height (in): 70 Pulse (bpm): 123 Weight (lbs): 216 Respiratory Rate (breaths/min): 18 Body Mass Index (BMI): 31 Blood Pressure (mmHg): 130/77 Reference Range: 80 - 120 mg / dl Electronic Signature(s) Signed: 12/07/2022 4:26:59 PM By: [name] Gouty RN, BSN Entered By: [name] [name] on 12/07/2022 08:38:13

## 2022-12-07 NOTE — Progress Notes (Signed)
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 continues to be stable around this some degree of chronic venous inflammation but he also has recurrent cellulitis. We have been using Iodoflex 03/17/17; continued improvement in the surface and may be small changes in dimensions. Using Iodoflex which seems the only thing that will control his surface 03/24/17- He is here for follow up evaluation of his LLE lateral ulceration and ulcer to right dorsal foot/toe space. He is voicing no complaints or concerns, He is tolerating compression wrap. 03/31/17 arrives today with a much healthier looking wound on the left lower extremity. We have been using Iodoflex for a prolonged period of time which has for the first time prepared and adequate looking wound bed although we have not had much in the way of wound dimension improvement. He also has a small wound between the first and second toe on the right 04/07/17; arrives today with a healthy-looking wound bed and at least the top 50% of this wound appears to be now her. No debridement was required I have changed him to  [facility] last week after prolonged Iodoflex. He did not do well with Apligraf's. We've had a re-opening between the first and second toe on the right 04/14/17; arrives today with a healthier looking wound bed contractions and the top 50% of this wound and some on the lesser 50%. Wound bed appears healthy. The area between the first and second toe on the right still remains problematic 04/21/17; continued very gradual improvement. Using [facility] 04/28/17; continued very gradual improvement in the left lateral leg venous insufficiency wound. His periwound erythema is very mild. We have been using Hydrofera Blue. Wound is making progress especially in the superior 50% 05/05/17; he continues to have very gradual improvement in the left lateral venous insufficiency wound. Both in terms with  an length rings are improving. I debrided this every 2 weeks with #5 curet and we have been using Hydrofera Blue and again making good progress With regards to the wounds between his right first and second toe which I thought might of been tinea pedis he is not making as much progress very dry scaly skin over the area. Also the area at the base of the left first and second toe in a similar condition 05/12/17; continued gradual improvement in the refractory left lateral venous insufficiency wound on the left. Dimension smaller. Surface still requiring debridement using Hydrofera Blue 05/19/17; continued gradual improvement in the refractory left lateral venous ulceration. Careful inspection of the wound bed underlying rumination suggested some degree of epithelialization over the surface no debridement indicated. Continue Hydrofera Blue difficult areas between his toes first and third on the left than first and second on the right. I'm going to change to silver alginate from silver collagen. Continue ketoconazole as I suspect underlying tinea pedis 05/26/17; left lateral leg venous insufficiency wound. We've been  using Hydrofera Blue. I believe that there is expanding epithelialization over the surface of the wound albeit not coming from the wound circumference. This is a bit of an odd situation in which the epithelialization seems to be coming from the surface of the wound rather than in the exact circumference. There is still small open areas mostly along the lateral margin of the wound. He has unchanged areas between the left first and second and the right first second toes which I been treating for tenia pedis 06/02/17; left lateral leg venous insufficiency wound. We have been using Hydrofera Blue. Somewhat smaller from the wound circumference. The surface of the wound remains a bit on it almost epithelialized sedation in appearance. I use an open curette today debridement in the surface of all of this especially the edges Small open wounds remaining on the dorsal right first and second toe interspace and the plantar left first second toe and her face on the left 06/09/17; wound on the left lateral leg continues to be smaller but very gradual and very dry surface using Hydrofera Blue 06/16/17 requires weekly debridements now on the left lateral leg although this continues to contract. I changed to silver collagen last week because of dryness of the wound bed. Using Iodoflex to the areas on his first and second toes/web space bilaterally 06/24/17; patient with history of paraplegia also chronic venous insufficiency with lymphedema. Has a very difficult wound on the left lateral leg. This has been gradually reducing in terms of with but comes in with a very dry adherent surface. High switch to silver collagen a week or so ago with hydrogel to keep the area moist. This is been refractory to multiple dressing attempts. He also has areas in his first and second toes bilaterally in the anterior and posterior web space. I had been using Iodoflex here after a prolonged course of silver alginate with ketoconazole was  ineffective [question tinea pedis] 07/14/17; patient arrives today with a very difficult adherent material over his left lateral lower leg wound. He also has surrounding erythema and poorly controlled edema. He was switched his Santyl last visit which the nurses are applying once during his doctor visit and once on a nurse visit. He was also reduced to 2 layer compression I'm not exactly sure of the issue here. 07/21/17; better surface today after 1 week of Iodoflex. Significant cellulitis that we treated last week also better. [Doxycycline] 07/28/17 better surface today  with now 2 weeks of Iodoflex. Significant cellulitis treated with doxycycline. He has now completed the doxycycline and he is back to his usual degree of chronic venous inflammation/stasis dermatitis. He reminds me he has had ablations surgery here 08/04/17; continued improvement with Iodoflex to the left lateral leg wound in terms of the surface of the wound although the dimensions are better. 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 Problematic areas on the plantar aspect of the first second toe web space on the left and the dorsal aspect of the first second toe web space on the right. At one point I felt these were probably related to chronic fungal infections in treated him aggressively for this although we have not made any improvement here. 08/11/17; left lateral leg. Surface continues to improve with the Iodoflex although we are not seeing much improvement in overall wound dimensions. Areas on Preuss, Kristopher E ([id number]) [phone].pdf Page 6 of 36 his plantar left foot and right foot show no improvement. In fact the right foot looks somewhat worse 08/18/17; left lateral leg. We changed to [facility] Blue last week after a prolonged course of Iodoflex which helps get the surface better. It appears that the wound with is improved. Continue  with difficult areas on the left dorsal first second and plantar first second on the right 09/01/17; patient arrives in clinic today having had a temperature of 103 yesterday. He was seen in the ER and [facility]. The patient was concerned he could have cellulitis again in the right leg however they diagnosed him with a UTI and he is now on Keflex. He has a history of cellulitis which is been recurrent and difficult but this is been in the left leg, in the past 5 use doxycycline. He does in and out catheterizations at home which are risk factors for UTI 09/08/17; patient will be completing his Keflex this weekend. The erythema on the left leg is considerably better. He has a new wound today on the medial part of the right leg small superficial almost looks like a skin tear. He has worsening of the area on the right dorsal first and second toe. His major area on the left lateral leg is better. Using Hydrofera Blue on all areas 09/15/17; gradual reduction in width on the long wound in the left lateral leg. No debridement required. He also has wounds on the plantar aspect of his left first second toe web space and on the dorsal aspect of the right first second toe web space. 09/22/17; there continues to be very gradual improvements in the dimensions of the left lateral leg wound. He hasn't round erythematous spot with might be pressure on his wheelchair. There is no evidence obviously of infection no purulence no warmth He has a dry scaled area on the plantar aspect of the left first second toe Improved area on the dorsal right first second toe. 09/29/17; left lateral leg wound continues to improve in dimensions mostly with an is still a fairly long but increasingly narrow wound. He has a dry scaled area on the plantar aspect of his left first second toe web space Increasingly concerning area on the dorsal right first second toe. In fact I am concerned today about possible cellulitis around this wound. The  areas extending up his second toe and although there is deformities here almost appears to abut on the nailbed. 10/06/17; left lateral leg wound continues to make very gradual progress. Tissue  culture I did from the right first second toe dorsal foot last time grew MRSA and enterococcus which was vancomycin sensitive. This was not sensitive to clindamycin or doxycycline. He is allergic to Zyvox and sulfa we have therefore arrange for him to have dalvance infusion tomorrow. He is had this in the past and tolerated it well 10/20/17; left lateral leg wound continues to make decent progress. This is certainly reduced in terms of with there is advancing epithelialization.The cellulitis in the right foot looks better although he still has a deep wound in the dorsal aspect of the first second toe web space. Plantar left first toe web space on the left I think is making some progress 10/27/17; left lateral leg wound continues to make decent progress. Advancing epithelialization.using Hydrofera Blue The right first second toe web space wound is better-looking using silver alginate Improvement in the left plantar first second toe web space. Again using silver alginate 11/03/17 left lateral leg wound continues to make decent progress albeit slowly. Using [facility] The right per second toe web space continues to be a very problematic looking punched out wound. I obtained a piece of tissue for deep culture I did extensively treated this for fungus. It is difficult to imagine that this is a pressure area as the patient states other than going outside he doesn't really wear shoes at home The left plantar first second toe web space looked fairly senescent. Necrotic edges. This required debridement change to [facility] Blue to all wound areas 11/10/17; left lateral leg wound continues to contract. Using Hydrofera Blue On the right dorsal first second toe web space dorsally. Culture I did of this area last week  grew MRSA there is not an easy oral option in this patient was multiple antibiotic allergies or intolerances. This was only a rare culture isolate I'm therefore going to use Bactroban under silver alginate On the left plantar first second toe web space. Debridement is required here. This is also unchanged 11/17/17; left lateral leg wound continues to contract using Hydrofera Blue this is no longer the major issue. The major concern here is the right first second toe web space. He now has an open area going from dorsally to the plantar aspect. There is now wound on the inner lateral part of the first toe. Not a very viable surface on this. There is erythema spreading medially into the forefoot. No major change in the left first second toe plantar wound 11/24/17; left lateral leg wound continues to contract using Hydrofera Blue. Nice improvement today The right first second toe web space all of this looks a lot less angry than last week. I have given him clindamycin and topical Bactroban for MRSA and terbinafine for the possibility of underlining tinea pedis that I could not control with ketoconazole. Looks somewhat better The area on the plantar left first second toe web space is weeping with dried debris around the wound 12/01/17; left lateral leg wound continues to contract he Hydrofera Blue. It is becoming thinner in terms of with nevertheless it is making good improvement. The right first second toe web space looks less angry but still a large necrotic-looking wounds starting on the plantar aspect of the right foot extending between the toes and now extensively on the base of the right second toe. I gave him clindamycin and topical Bactroban for MRSA anterior benefiting for the possibility of underlying tinea pedis. Not looking better today The area on the left first/second toe looks better. Debrided of  necrotic debris 12/05/17* the patient was worked in urgently today because over the weekend he  found blood on his incontinence bad when he woke up. He was found to have an ulcer by his wife who does most of his wound care. He came in today for [place] to look at this. He has not had a history of wounds in his buttocks in spite of his paraplegia. 12/08/17; seen in follow-up today at his usual appointment. He was seen earlier this week and found to have a new wound on his buttock. We also follow him for wounds on the left lateral leg, left first second toe web space and right first second toe web space 12/15/17; we have been using Hydrofera Blue to the left lateral leg which has improved. The right first second toe web space has also improved. Left first second toe web space plantar aspect looks stable. The left buttock has worsened using Santyl. Apparently the buttock has drainage 12/22/17; we have been using Hydrofera Blue to the left lateral leg which continues to improve now 2 small wounds separated by normal skin. He tells [place] he had a fever up to 100 yesterday he is prone to UTIs but has not noted anything different. He does in and out catheterizations. The area between the first and second toes today does not look good necrotic surface covered with what looks to be purulent drainage and erythema extending into the third toe. I had gotten this to something that I thought look better last time however it is not look good today. He also has a necrotic surface over the buttock wound which is expanded. I thought there might be infection under here so I removed a lot of the surface with a #5 curet though nothing look like it really needed culturing. He is been using Santyl to this area 12/27/17; his original wound on the left lateral leg continues to improve using Hydrofera Blue. I gave him samples of Baxdella although he was unable to take them out of fear for an allergic reaction ["lump in his throat"].the culture I did of the purulent drainage from his second toe last week showed both enterococcus and a  set Enterobacter I was also concerned about the erythema on the bottom of his foot although paradoxically although this looks somewhat better today. 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 We've been using silver alginate here. They came up today that he sleeps in a recliner, I'm not sure why but I asked him to stop this 01/03/18; his original wound we've been using Hydrofera Blue is now separated into 2 areas. Ulcer on his left buttock is better he is off the recliner and sleeping in bed Finally both wound areas between his first and second toes also looks some better 01/10/18; his original wound on the left lateral leg is now separated into 2 wounds we've been using Hydrofera Blue Ulcer on his left buttock has some drainage. There is a small probing site going into muscle layer superiorly.using silver alginate -He arrives today with a deep tissue injury on the left heel The wound on the dorsal aspect of his first second toe on the left looks a lot betterusing silver alginate ketoconazole The area on the first second toe web space on the right also looks a lot bette 01/17/18; his original wound on the left lateral leg continues to progress using Hydrofera Blue Ulcer on his  left buttock also is smaller surface healthier except for a small probing site going into the muscle layer superiorly. 2.4 cm of tunneling in this area DTI on his left heel we have only been offloading. Looks better than last week no threatened open no evidence of infection the wound on the dorsal aspect of the first second toe on the left continues to look like it's regressing we have only been using silver alginate and terbinafine orally The area in the first second toe web space on the right also looks to be a lot better using silver alginate and terbinafine I think this was prompted by tinea pedis Zaccaro, Dwayn E ([id number])  [id number] Page 7 of 36 01/31/18; the patient was hospitalized in [place] last week apparently for a complicated UTI. He was discharged on cefepime he does in and out catheterizations. In the hospital he was discovered M I don't mild elevation of AST and ALT and the terbinafine was stopped.predictably the pressure ulcer on s his buttock looks betterusing silver alginate. The area on the left lateral leg also is better using Hydrofera Blue. The area between the first and second toes on the left better. First and second toes on the right still substantial but better. Finally the DTI on the left heel has held together and looks like it's resolving 02/07/18-he is here in follow-up evaluation for multiple ulcerations. He has new injury to the lateral aspect of the last issue a pressure ulcer, he states this is from adhesive removal trauma. He states he has tried multiple adhesive products with no success. All other ulcers appear stable. The left heel DTI is resolving. We will continue with same treatment plan and follow-up next week. 02/14/18; follow-up for multiple areas. He has a new area last week on the lateral aspect of his pressure ulcer more over the posterior trochanter. The original pressure ulcer looks quite stable has healthy granulation. 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 Almost fully epithelialized on the original superior area using Hydrofera Blue DTI on the left heel has peeled off this week to reveal a small superficial wound under denuded skin and subcutaneous tissue Both areas between the first and second toes look better including nothing open on the left 02/21/18; The patient's wounds on his left ischial tuberosity and posterior left greater trochanter actually looked better. He has a large area of irritation around the area which I think is contact  dermatitis. I am doubtful that this is fungal His original wound on the left lateral calf continues to improve we have been using Hydrofera Blue There is no open area in the left first second toe web space although there is a lot of thick callus The DTI on the left heel required debridement today of necrotic surface eschar and subcutaneous tissue using silver alginate Finally the area on the right first second toe webspace continues to contract using silver alginate and ketoconazole 02/28/18 Left ischial tuberosity wounds look better using silver alginate. Original wound on the left calf only has one small open area left using Hydrofera Blue DTI on the left heel required debridement mostly removing skin from around this wound surface. Using silver alginate The areas on the right first/second toe web space using silver alginate and ketoconazole 03/08/18 on evaluation today patient appears to be doing decently well as best I can tell in regard to his wounds. This is the first time that  I have seen him as he generally is followed by Dr. Dellia Nims. With that being said none of his wounds appear to be infected he does have an area where there is some skin covering what appears to be a new wound on the left dorsal surface of his great toe. This is right at the nail bed. With that being said I do believe that debrided away some of the excess skin can be of benefit in this regard. Otherwise he has been tolerating the dressing changes without complication. 03/14/18; patient arrives today with the multiplicity of wounds that we are following. He has not been systemically unwell Original wound on the left lateral calf now only has 2 small open areas we've been using Hydrofera Blue which should continue The deep tissue injury on the left heel requires debridement today. We've been using silver alginate The left first second toe and the right first second toe are both are reminiscence what I think was tinea pedis.  Apparently some of the callus Surface between the toes was removed last week when it started draining. Purulent drainage coming from the wound on the ischial tuberosity on the left. 03/21/18-He is here in follow-up evaluation for multiple wounds. 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 He tolerated debridement. The only change to last week's recommendations is to discontinue antifungal cream between toes. He will follow-up next week 03/28/18; following up for multiple wounds;Concern this week is streaking redness and swelling in the right foot. He is going to need antibiotics for this. 03/31/18; follow-up for right foot cellulitis. Streaking redness and swelling in the right foot on 03/28/18. 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 I put him on clindamycin 300 mg every 6 and brought him in for a quick check. He has an open wound between his first and second toes on the right foot as a potential source. 04/04/18; Right foot cellulitis is resolving he is completing clindamycin. This is truly good news Left lateral calf wound which is initial wound only has one small open area inferiorly this is close to healing out. He has compression stockings. We will use Hydrofera Blue right down to the epithelialization of this Nonviable surface on the left heel which was initially pressure with a DTI. We've been using Hydrofera Blue. I'm going to switch this back to silver alginate Left first second toe/tinea pedis this looks better using silver alginate Right first second toe tinea pedis using silver alginate Large pressure ulcers on theLeft ischial tuberosity. Small wound here Looks better. I am uncertain about the surface over the large wound. Using silver alginate 04/11/18; Cellulitis in the right foot is resolved Left lateral calf wound which was his original wounds still has 2 tiny open areas remaining this is  just about closed Nonviable surface on the left heel is better but still requires debridement Left first second toe/tinea pedis still open using silver alginate Right first second toe wound tinea pedis I asked him to go back to using ketoconazole and silver alginate Large pressure ulcers on the left ischial tuberosity this shear injury here is resolved. Wound is smaller. No evidence of infection using silver alginate 04/18/18; Patient arrives with an intense area of cellulitis in the right mid lower calf extending into the right heel area. Bright red and warm. Smaller area on the left anterior leg. He has a significant history of MRSA. He will definitely need antibioticsdoxycycline He now has 2  open areas on the left ischial tuberosity the original large wound and now a satellite area which I think was above his initial satellite areas. Not a wonderful surface on this satellite area surrounding erythema which looks like pressure related. His left lateral calf wound again his original wound is just about closed Left heel pressure injury still requiring debridement Left first second toe looks a lot better using silver alginate Right first second toe also using silver alginate and ketoconazole cream also looks better 04/20/18; the patient was worked in early today out of concerns with his cellulitis on the right leg. I had started him on doxycycline. This was 2 days ago. His wife was concerned about the swelling in the area. Also concerned about the left buttock. He has not been systemically unwell no fever chills. No nausea vomiting or diarrhea 04/25/18; the patient's left buttock wound is continued to deteriorate he is using Hydrofera Blue. He is still completing clindamycin for the cellulitis on the right leg although all of this looks better. 05/02/18 Left buttock wound still with a lot of drainage and a very tightly adherent fibrinous necrotic surface. He has a deeper area superiorly The left  lateral calf wound is still closed DTI wound on the left heel necrotic surface especially the circumference using Iodoflex Areas between his left first second toe and right first second toe both look better. Dorsally and the right first second toe he had a necrotic surface although at smaller. In using silver alginate and ketoconazole. I did a culture last week which was a deep tissue culture of the reminiscence of the open wound on the right first second toe dorsally. This grew a few Acinetobacter and a few methicillin-resistant staph aureus. Nevertheless the area actually this week looked better. I didn't feel the need to specifically address this at least in terms of systemic antibiotics. 05/09/18; wounds are measuring larger more drainage per our intake. We are using Santyl covered with alginate on the large superficial buttock wounds, Iodosorb on the left heel, ketoconazole and silver alginate to the dorsal first and second toes bilaterally. 05/16/18; The area on his left buttock better in some aspects although the area superiorly over the ischial tuberosity required an extensive debridement.using Santyl Left heel appears stable. Using Iodoflex The areas between his first and second toes are not bad however there is spreading erythema up the dorsal aspect of his left foot this looks like cellulitis again. He is insensate the erythema is really very brilliant.o Erysipelas Fancher, Kishawn E ([id number]) [phone].pdf Page 8 of 36 He went to see an allergist days ago because he was itching part of this he had lab work done. This showed a white count of 15.1 with 70% neutrophils. Hemoglobin of 11.4 and a platelet count of 659,000. Last white count we had in [facility] was a 2-1/2 years ago which was 25.9 but he was ill at the time. He was able to show me some lab work that was done by his primary physician the pattern is about the same. 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 But prompted me to go ahead and do x-rays of both feet and the pelvis rule out osteomyelitis. He also had a comprehensive metabolic panel this was reasonably normal his albumin was 3.7 liver function tests BUN/creatinine all normal 05/23/18; x-rays of both his feet from last week were negative for underlying pulmonary abnormality. The x-ray of his pelvis however showed mild  irregularity in the left ischial which may represent some early osteomyelitis. The wound in the left ischial continues to get deeper clearly now exposed muscle. Each week necrotic surface material over this area. Whereas the rest of the wounds do not look so bad. The left ischial wound we have been using Santyl and calcium alginate T the left heel surface necrotic debris using Iodoflex o The left lateral leg is still healed Areas on the left dorsal foot and the right dorsal foot are about the same. There is some inflammation on the left which might represent contact dermatitis, fungal dermatitis I am doubtful cellulitis although this looks better than last week 05/30/18; CT scan done at Hospital did not show any osteomyelitis or abscess. Suggested the possibility of underlying cellulitis although I don't see a lot of evidence of this at the bedside The wound itself on the left buttock/upper thigh actually looks somewhat better. No debridement Left heel also looks better no debridement continue Iodoflex Both dorsal first second toe spaces appear better using Lotrisone. Left still required debridement 06/06/18; Intake reported some purulent looking drainage from the left gluteal wound. Using Santyl and calcium alginate Left heel looks better although still a nonviable surface requiring debridement The left dorsal foot first/second webspace actually expanding and somewhat deeper. I may consider doing a shave biopsy of this area Right dorsal foot first/second webspace appears stable to improved.  Using Lotrisone and silver alginate to both these areas 06/13/18 Left gluteal surface looks better. Now separated in the 2 wounds. No debridement required. Still drainage. We'll continue silver alginate Left heel continues to look better with Iodoflex continue this for at least another week Of his dorsal foot wounds the area on the left still has some depth although it looks better than last week. We've been using Lotrisone and silver alginate 06/20/18 Left gluteal continues to look better healthy tissue Left heel continues to look better healthy granulation wound is smaller. He is using Iodoflex and his long as this continues continue the Iodoflex Dorsal right foot looks better unfortunately dorsal left foot does not. There is swelling and erythema of his forefoot. He had minor trauma to this several days ago but doesn't think this was enough to have caused any tissue injury. Foot looks like cellulitis, we have had this problem before 06/27/18 on evaluation today patient appears to be doing a little worse in regard to his foot ulcer. 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 Both of which would really be his only options for treating this infection. In the past he has been given and effusion of Orbactiv. This is done very well for him in the past again it's one time dosing IV antibiotic therapy. Subsequently I do believe this is something we're gonna need to see about doing at this point in time. Currently his other wounds seem to be doing somewhat better in my pinion I'm pretty happy in that regard. 07/03/18 on evaluation today patient's wounds actually appear to be doing fairly well. He has been tolerating the dressing changes without complication. All in all he seems to be showing signs of improvement. In regard to the antibiotics he has been dealing with  infectious disease since I saw him last week as far as getting this scheduled. In the end [facility]'s [facility] done this coming Friday. In the meantime he has been continuing to perform the dressing changes in such as previous. There does not appear to be any evidence of infection worsengin at this time. 07/10/18; Since I last saw this man 2 weeks ago things have actually improved. IV antibiotics of resulted in less forefoot erythema although there is still some present. He is not systemically unwell Left buttock wounds 2 now have no depth there is increased epithelialization Using silver alginate Left heel still requires debridement using Iodoflex Left dorsal foot still with a sizable wound about the size of a border but healthy granulation Right dorsal foot still with a slitlike area using silver alginate 07/18/18; the patient's cellulitis in the left foot is improved in fact I think it is on its way to resolving. Left buttock wounds 2 both look better although the larger one has hypertension granulation we've been using silver alginate Left heel has some thick circumferential redundant skin over the wound edge which will need to be removed today we've been using Iodoflex Left dorsal foot is still a sizable wound required debridement using silver alginate The right dorsal foot is just about closed only a small open area remains here 07/25/18; left foot cellulitis is resolved Left buttock wounds 2 both look better. Hyper-granulation on the major area Left heel as some debris over the surface but otherwise looks a healthier wound. Using silver collagen Right dorsal foot is just about closed 07/31/18; arrives with our intake nurse worried about purulent drainage from the buttock. We had hyper-granulation here last week His buttock wounds 2 continue to look better Left heel some debris over the surface but measuring smaller. Right dorsal foot  unfortunately has openings between the toes Left foot superficial wound looks less aggravated. 08/07/18 Buttock wounds continue to look better although some of her granulation and the larger medial wound. silver alginate Left heel continues to look a lot better.silver collagen Left foot superficial wound looks less stable. Requires debridement. He has a new wound superficial area on the foot on the lateral dorsal foot. Right foot looks better using silver alginate without Lotrisone 08/14/2018; patient was in the ER last week diagnosed with a UTI. He is now on Cefpodoxime and Macrodantin. Buttock wounds continued to be smaller. Using silver alginate Left heel continues to look better using silver collagen Left foot superficial wound looks as though it is improving Right dorsal foot area is just about healed. 08/21/2018; patient is completed his antibiotics for his UTI. He has 2 open areas on the buttocks. There is still not closed although the surface looks satisfactory. Using silver alginate Left heel continues to improve using silver collagen The bilateral dorsal foot areas which are at the base of his first and second toes/possible tinea pedis are actually stable on the left but worse on the right. The area on the left required debridement of necrotic surface. After debridement I obtained a specimen for PCR culture. The right dorsal foot which is been just about healed last week is now reopened 08/28/2018; culture done on the left dorsal foot showed coag negative staph both staph epidermidis and Lugdunensis. I think this is worthwhile initiating systemic treatment. I will use doxycycline given his long list of allergies. The area on the left heel slightly improved but still requiring debridement. The large wound on the buttock is just about closed whereas the smaller one is larger. Using silver alginate in this area 09/04/2018; patient is completing his doxycycline for the left  foot although this  continues to be a very difficult wound area with very adherent necrotic debris. We are using silver alginate to all his wounds right foot left foot and the small wounds on his buttock, silver collagen on the left heel. 09/11/2018; once again this patient has intense erythema and swelling of the left forefoot. Lesser degrees of erythema in the right foot. He has a long list of allergies and intolerances. I will reinstitute doxycycline. 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 Using silver alginate Left heel also looks better using silver collagen Gregory, Caine E ([id number]) [id number] Page 9 of 36 Unfortunately both the areas on his feet look worse. The area on the left first second webspace is now gone through to the plantar part of his foot. The area on the left foot anteriorly is irritated with erythema and swelling in the forefoot. 09/25/2018 His wound on the left plantar heel looks better. Using silver collagen The area on the left buttock 2 small remnant areas. One is closed one is still open. Using silver alginate The areas between both his first and second toes look worse. This in spite of long-standing antifungal therapy with ketoconazole and silver alginate which should have antifungal activity He has small areas around his original wound on the left calf one is on the bottom of the original scar tissue and one superiorly both of these are small and superficial but again given wound history in this site this is worrisome 10/02/2018 Left plantar heel continues to gradually contract using silver collagen Left buttock wound is unchanged using silver alginate The areas on his dorsal feet between his first and second toes bilaterally look about the same. I prescribed clindamycin ointment to see if we can address chronic staph colonization and also the underlying possibility of erythrasma The left lateral lower  extremity wound is actually on the lateral part of his ankle. Small open area here. We have been using silver alginate 10/09/2018; Left plantar heel continues to look healthy and contract. No debridement is required Left buttock slightly smaller with a tape injury wound just below which was new this week Dorsal feet somewhat improved I have been using clindamycin Left lateral looks lower extremity the actual open area looks worse although a lot of this is epithelialized. I am going to change to silver collagen today He has a lot more swelling in the right leg although this is not pitting not red and not particularly warm there is a lot of spasm in the right leg usually indicative of people with paralysis of some underlying discomfort. We have reviewed his vascular status from 2017 he had a left greater saphenous vein ablation. I wonder about referring him back to vascular surgery if the area on the left leg continues to deteriorate. 10/16/2018 in today for follow-up and management of multiple lower extremity ulcers. His left Buttock wound is much lower smaller and almost closed completely. The wound to the left ankle has began to reopen with Epithelialization and some adherent slough. He has multiple new areas to the left foot and leg. The left dorsal foot without much improvement. Wound present between left great webspace and 2nd toe. Erythema and edema present right leg. Right LE ultrasound obtained on 10/10/18 was negative for DVT . 10/23/2018; Left buttock is closed over. Still dry macerated skin but there is no open wound. I suspect this is chronic pressure/moisture Left lateral calf is quite a  bit worse than when I saw this last. There is clearly drainage here he has macerated skin into the left plantar heel. We will change the primary dressing to alginate Left dorsal foot has some improvement in overall wound area. Still using clindamycin and silver alginate Right dorsal foot about the same  as the left using clindamycin and silver alginate The erythema in the right leg has resolved. He is DVT rule out was negative Left heel pressure area required debridement although the wound is smaller and the surface is health 10/26/2018 The patient came back in for his nurse check today predominantly because of the drainage coming out of the left lateral leg with a recent reopening of his original wound on the left lateral calf. He comes in today with a large amount of surrounding erythema around the wound extending from the calf into the ankle and even in the area on the dorsal foot. He is not systemically unwell. He is not febrile. Nevertheless this looks like cellulitis. We have been using silver alginate to the area. I changed him to a regular visit and I am going to prescribe him doxycycline. The rationale here is a long list of medication intolerances and a history of MRSA. I did not see anything that I thought would provide a valuable culture 10/30/2018 Follow-up from his appointment 4 days ago with really an extensive area of cellulitis in the left calf left lateral ankle and left dorsal foot. I put him on doxycycline. He has a long list of medication allergies which are true allergy reactions. 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 In any case he is a lot better today. The erythema especially in the anterior and lateral left calf is better. He still has left ankle erythema. He also is complaining about increasing edema in the right leg we have only been using Kerlix Coban and he has been doing the wraps at home. Finally he has a spotty rash on the medial part of his upper left calf which looks like folliculitis or perhaps wrap occlusion type injury. Small superficial macules not pustules 11/06/18 patient arrives today with again a considerable degree of erythema around the wound on the left lateral calf extending into  the dorsal ankle and dorsal foot. This is a lot worse than when I saw this last week. He is on doxycycline really with not a lot of improvement. He has not been systemically unwell Wounds on the; left heel actually looks improved. Original area on the left foot and proximity to the first and second toes looks about the same. He has superficial areas on the dorsal foot, anterior calf and then the reopening of his original wound on the left lateral calf which looks about the same The only area he has on the right is the dorsal webspace first and second which is smaller. He has a large area of dry erythematous skin on the left buttock small open area here. 11/13/2018; the patient arrives in much better condition. The erythema around the wound on the left lateral calf is a lot better. Not sure whether this was the clindamycin or the TCA and ketoconazole or just in the improvement in edema control [stasis dermatitis]. In any case this is a lot better. The area on the left heel is very small and just about resolved using silver collagen we have been using silver alginate to the areas on his dorsal feet 11/20/2018; his wounds  include the left lateral calf, left heel, dorsal aspects of both feet just proximal to the first second webspace. He is stable to slightly improved. I did not think any changes to his dressings were going to be necessary 11/27/2018 he has a reopening on the left buttock which is surrounded by what looks like tinea or perhaps some other form of dermatitis. The area on the left dorsal foot has some erythema around it I have marked this area but I am not sure whether this is cellulitis or not. Left heel is not closed. Left calf the reopening is really slightly longer and probably worse 1/13; in general things look better and smaller except for the left dorsal foot. Area on the left heel is just about closed, left buttock looks better only a small wound remains in the skin looks better [using  Lotrisone] 1/20; the area on the left heel only has a few remaining open areas here. Left lateral calf about the same in terms of size, left dorsal foot slightly larger right lateral foot still not closed. The area on the left buttock has no open wound and the surrounding skin looks a lot better 1/27; the area on the left heel is closed. Left lateral calf better but still requiring extensive debridements. The area on his left buttock is closed. He still has the open areas on the left dorsal foot which is slightly smaller in the right foot which is slightly expanded. We have been using Iodoflex on these areas as well 2/3; left heel is closed. Left lateral calf still requiring debridement using Iodoflex there is no open area on his left buttock however he has dry scaly skin over a large area of this. Not really responding well to the Lotrisone. Finally the areas on his dorsal feet at the level of the first second webspace are slightly smaller on the right and about the same on the left. Both of these vigorously debrided with Anasept and gauze 2/10; left heel remains closed he has dry erythematous skin over the left buttock but there is no open wound here. Left lateral leg has come in and with. Still requiring debridement we have been using Iodoflex here. Finally the area on the left dorsal foot and right dorsal foot are really about the same extremely dry callused fissured areas. He does not yet have a dermatology appointment 2/17; left heel remains closed. He has a new open area on the left buttock. The area on the left lateral calf is bigger longer and still covered in necrotic debris. No major change in his foot areas bilaterally. I am awaiting for a dermatologist to look on this. We have been using ketoconazole I do not know that this is been doing any good at all. 2/24; left heel remains closed. The left buttock wound that was new reopening last week looks better. The left lateral calf appears  better also although still requires debridement. The major area on his foot is the left first second also requiring debridement. We have been putting Prisma on all wounds. I do not believe that the ketoconazole has done too much good for his feet. He will use Lotrisone I am going to give him a 2-week course of terbinafine. We still do not have a dermatology appointment 3/2 left heel remains closed however there is skin over bone in this area I pointed this out to him today. The left buttock wound is epithelialized but still does not look completely stable. The area on the  left leg required debridement were using silver collagen here. With regards to his feet we changed to Lotrisone last week and silver alginate. 3/9; left heel remains closed. Left buttock remains closed. The area on the right foot is essentially closed. The left foot remains unchanged. Slightly smaller on the left lateral calf. Using silver collagen to both of these areas Mcmullen, Aveer E ([id number]) [phone].pdf Page 10 of 36 3/16-Left heel remains closed. Area on right foot is closed. Left lateral calf above the lateral malleolus open wound requiring debridement with easy bleeding. Left dorsal wound proximal to first toe also debrided. Left ischial area open new. Patient has been using Prisma with wrapping every 3 days. Dermatology appointment is apparently tomorrow.Patient has completed his terbinafine 2-week course with some apparent improvement according to him, there is still flaking and dry skin in his foot on the left 3/23; area on the right foot is reopened. The area on the left anterior foot is about the same still a very necrotic adherent surface. He still has the area on the left leg and reopening is on the left buttock. He apparently saw dermatology although I do not have a note. According to the patient who is usually fairly well informed they did not have any good ideas. Put him on oral  terbinafine which she is been on before. 3/30; using silver collagen to all wounds. Apparently his dermatologist put him on doxycycline and rifampin presumably some culture grew staph. I do not have this result. He remains on terbinafine although I have used terbinafine on him before 4/6; patient has had a fairly substantial reopening on the right foot between the first and second toes. He is finished his terbinafine and I believe is on doxycycline and rifampin still as prescribed by dermatology. We have been using silver collagen to all his wounds although the patient reports that he thinks silver alginate does better on the wounds on his buttock. 4/13; the area on his left lateral calf about the same size but it did not require debridement. Left dorsal foot just proximal to the webspace between the first and second toes is about the same. Still nonviable surface. I note some superficial bronze discoloration of the dorsal part of his foot Right dorsal foot just proximal to the first and second toes also looks about the same. I still think there may be the same discoloration I noted above on the left Left buttock wound looks about the same 4/20; left lateral calf appears to be gradually contracting using silver collagen. 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 The left dorsal foot wound is debrided of tightly adherent necrotic debris and really cleans up quite nicely. The right area is worse with expansion. I did not debride this it is now over the base of the second toe The area on his left buttock is smaller no debridement is required using silver collagen 5/4; left calf continues to make good progress. He arrives with erythema around the wounds on his dorsal foot which even extends to the plantar aspect. Very concerning for coexistent infection. He is finished the erythromycin I gave him for possible erythrasma this does not seem to have  helped. The area on the left foot is about the same base of the dorsal toes Is area on the buttock looks improved on the left 5/11; left calf and left buttock continued to make good progress. Left foot is about the same to slightly improved. Major problem is  on the right foot. He has not had an x-ray. Deep tissue culture I did last week showed both Enterobacter and E. coli. I did not change the doxycycline I put him on empirically although neither 1 of these were plated to doxycycline. He arrives today with the erythema looking worse on both the dorsal and plantar foot. Macerated skin on the bottom of the foot. he has not been systemically unwell 5/18-Patient returns at 1 week, left calf wound appears to be making some progress, left buttock wound appears slightly worse than last time, left foot wound looks slightly better, right foot redness is marginally better. X-ray of both feet show no air or evidence of osteomyelitis. Patient is finished his Omnicef and terbinafine. He continues to have macerated skin on the bottom of the left foot as well as right 5/26; left calf wound is better, left buttock wound appears to have multiple small superficial open areas with surrounding macerated skin. X-rays that I did last time showed no evidence of osteomyelitis in either foot. He is finished cefdinir and doxycycline. I do not think that he was on terbinafine. He continues to have a large superficial open area on the right foot anterior dorsal and slightly between the first and second toes. I did send him to dermatology 2 months ago or so wondering about whether they would do a fungal scraping. I do not believe they did but did do a culture. We have been using silver alginate to the toe areas, he has been using antifungals at home topically either ketoconazole or Lotrisone. We are using silver collagen on the left foot, silver alginate on the right, silver collagen on the left lateral leg and silver alginate  on the left buttock 6/1; left buttock area is healed. We have the left dorsal foot, left lateral leg and right dorsal foot. We are using silver alginate to the areas on both feet and silver collagen to the area on his left lateral calf 6/8; the left buttock apparently reopened late last week. He is not really sure how this happened. He is tolerating the terbinafine. Using silver alginate to all wounds 6/15; left buttock wound is larger than last week but still superficial. Came in the clinic today with a report of purulence from the left lateral leg I did not identify any infection Both areas on his dorsal feet appear to be better. He is tolerating the terbinafine. Using silver alginate to all wounds 6/22; left buttock is about the same this week, left calf quite a bit better. His left foot is about the same however he comes in with erythema and warmth in the right forefoot once again. Culture that I gave him in the beginning of May showed Enterobacter and E. coli. I gave him doxycycline and things seem to improve although neither 1 of these organisms was specifically plated. 6/29; left buttock is larger and dry this week. Left lateral calf looks to me to be improved. Left dorsal foot also somewhat improved right foot completely unchanged. The erythema on the right foot is still present. He is completing the Ceftin dinner that I gave him empirically [see discussion above.) 7/6 - All wounds look to be stable and perhaps improved, the left buttock wound is slightly smaller, per patient bleeds easily, completed ceftin, the right foot redness is less, he is on terbinafine 7/13; left buttock wound about the same perhaps slightly narrower. Area on the left lateral leg continues to narrow. Left dorsal foot slightly smaller right foot  about the same. We are using silver alginate on the right foot and Hydrofera Blue to the areas on the left. Unna boot on the left 2 layer compression on the right 7/20; left  buttock wound absolutely the same. Area on lateral leg continues to get better. Left dorsal foot require debridement as did the right no major change in the 7/27; left buttock wound the same size necrotic debris over the surface. The area on the lateral leg is closed once again. His left foot looks better right foot about the same although there is some involvement now of the posterior first second toe area. He is still on terbinafine which I have given him for a month, not certain a centimeter major change 06/25/19-All wounds appear to be slightly improved according to report, left buttock wound looks clean, both foot wounds have minimal to no debris the right dorsal foot has minimal slough. We are using Hydrofera Blue to the left and silver alginate to the right foot and ischial wound. 8/10-Wounds all appear to be around the same, the right forefoot distal part has some redness which was not there before, however the wound looks clean and small. Ischial wound looks about the same with no changes 8/17; his wound on the left lateral calf which was his original chronic venous insufficiency wound remains closed. Since I last saw him the areas on the left dorsal foot right dorsal foot generally appear better but require debridement. The area on his left initial tuberosity appears somewhat larger to me perhaps hyper granulated and bleeds very easily. We have been using Hydrofera Blue to the left dorsal foot and silver alginate to everything else 8/24; left lateral calf remains closed. The areas on his dorsal feet on the webspace of the first and second toes bilaterally both look better. The area on the left buttock which is the pressure ulcer stage II slightly smaller. I change the dressing to Hydrofera Blue to all areas 8/31; left lateral calf remains closed. The area on his dorsal feet bilaterally look better. Using Hydrofera Blue. Still requiring debridement on the left foot. No change in the left  buttock pressure ulcers however 9/14; left lateral calf remains closed. Dorsal feet look quite a bit better than 2 weeks ago. Flaking dry skin also a lot better with the ammonium lactate I gave him 2 weeks ago. The area on the left buttock is improved. He states that his Roho cushion developed a leak and he is getting a new one, in the interim he is offloading this vigorously 9/21; left calf remains closed. Left heel which was a possible DTI looks better this week. He had macerated tissue around the left dorsal foot right foot looks satisfactory and improved left buttock wound. I changed his dressings to his feet to silver alginate bilaterally. Continuing Hydrofera Blue on the left buttock. 9/28 left calf remains closed. Left heel did not develop anything [possible DTI] dry flaking skin on the left dorsal foot. Right foot looks satisfactory. Improved left buttock wound. We are using silver alginate on his feet Hydrofera Blue on the buttock. I have asked him to go back to the Lotrisone on his feet including the wounds and surrounding areas 10/5; left calf remains closed. The areas on the left and right feet about the same. A lot of this is epithelialized however debris over the remaining open areas. He is using Lotrisone and silver alginate. The area on the left buttock using Hydrofera Blue 10/26. Patient has been out  for 3 weeks secondary to Covid concerns. He tested negative but I think his wife tested positive. He comes in today with the left foot substantially worse, right foot about the same. Even more concerning he states that the area on his left buttock closed over but then reopened and is considerably deeper in one aspect than it was before [stage III wound] Robb, Khaliel E ([id number]) [phone].pdf Page 11 of 36 11/2; left foot really about the same as last week. Quarter sized wound on the dorsal foot just proximal to the first second toes. Surrounding erythema  with areas of denuded epithelium. This is not really much different looking. Did not look like cellulitis this time however. Right foot area about the same.. We have been using silver alginate alginate on his toes Left buttock still substantial irritated skin around the wound which I think looks somewhat better. We have been using Hydrofera Blue here. 11/9; left foot larger than last week and a very necrotic surface. Right foot I think is about the same perhaps slightly smaller. Debris around the circumference also addressed. Unfortunately on the left buttock there is been a decline. Satellite lesions below the major wound distally and now a an additional one posteriorly we have been using Hydrofera Blue but I think this is a pressure issue 11/16; left foot ulcer dorsally again a very adherent necrotic surface. Right foot is about the same. Not much change in the pressure ulcer on his left buttock. 11/30; left foot ulcer dorsally basically the same as when I saw him 2 weeks ago. Very adherent fibrinous debris on the wound surface. Patient reports a lot of drainage as well. 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 We have been using Iodoflex, patient changed back to alginate because of the drainage. Area on his right dorsal foot really looks benign with a healthier surface certainly a lot better than on the left. Left buttock wounds all improved using Hydrofera Blue 12/7; left dorsal foot again no improvement. Tightly adherent debris. PCR culture I did last week only showed likely skin contaminant. I have gone ahead and done a punch biopsy of this which is about the last thing in terms of investigations I can think to do. He has known venous insufficiency and venous hypertension and this could be the issue here. The area on the right foot is about the same left buttock slightly worse according to our intake nurse secondary to [facility] Blue sticking to  the wound 12/14; biopsy of the left foot that I did last time showed changes that could be related to wound healing/chronic stasis dermatitis phenomenon no neoplasm. We have been using silver alginate to both feet. I change the one on the left today to Sorbact and silver alginate to his other 2 wounds 12/28; the patient arrives with the following problems; Major issue is the dorsal left foot which continues to be a larger deeper wound area. Still with a completely nonviable surface Paradoxically the area mirror image on the right on the right dorsal foot appears to be getting better. He had some loss of dry denuded skin from the lower part of his original wound on the left lateral calf. Some of this area looked a little vulnerable and for this reason we put him in wrap that on this side this week The area on his left buttock is larger. He still has the erythematous circular area which I think is a combination of pressure, sweat. This  does not look like cellulitis or fungal dermatitis 11/26/2019; -Dorsal left foot large open wound with depth. Still debris over the surface. Using Sorbact The area on the dorsal right foot paradoxically has closed over He has a reopening on the left ankle laterally at the base of his original wound that extended up into the calf. This appears clean. The left buttock wound is smaller but with very adherent necrotic debris over the surface. We have been using silver alginate here as well The patient had arterial studies done in 2017. He had biphasic waveforms at the dorsalis pedis and posterior tibial bilaterally. ABI in the left was 1.17. Digit waveforms were dampened. He has slight spasticity in the great toes I do not think a TBI would be possible 1/11; the patient comes in today with a sizable reopening between the first and second toes on the right. This is not exactly in the same location where we have been treating wounds previously. According to our intake nurse  this was actually fairly deep but 0.6 cm. The area on the left dorsal foot looks about the same the surface is somewhat cleaner using Sorbact, his MRI is in 2 days. We have not managed yet to get arterial studies. The new reopening on the left lateral calf looks somewhat better using alginate. The left buttock wound is about the same using alginate 1/18; the patient had his ARTERIAL studies which were quite normal. ABI in the right at 1.13 with triphasic/biphasic waveforms on the left ABI 1.06 again with triphasic/biphasic waveforms. It would not have been possible to have done a toe brachial index because of spasticity. We have been using Sorbac to the left foot alginate to the rest of his wounds on the right foot left lateral calf and left buttock 1/25; arrives in clinic with erythema and swelling of the left forefoot worse over the first MTP area. This extends laterally dorsally and but also posteriorly. Still has an area on the left lateral part of the lower part of his calf wound it is eschared and clearly not closed. Area on the left buttock still with surrounding irritation and erythema. Right foot surface wound dorsally. The area between the right and first and second toes appears better. 2/1; The left foot wound is about the same. Erythema slightly better I gave him a week of doxycycline empirically Right foot wound is more extensive extending between the toes to the plantar surface Left lateral calf really no open surface on the inferior part of his original wound however the entire area still looks vulnerable Absolutely no improvement in the left buttock wound required debridement. 2/8; the left foot is about the same. Erythema is slightly improved I gave him clindamycin last week. Right foot looks better he is using Lotrimin and silver alginate He has a breakdown in the left lateral calf. Denuded epithelium which I have removed Left buttock about the same were using Hydrofera  Blue 2/15; left foot is about the same there is less surrounding erythema. Surface still has tightly adherent debris which I have debriding however not making any progress Right foot has a substantial wound on the medial right second toe between the first and second webspace. Still an open area on the left lateral calf distal area. Buttock wound is about the same 2/22; left foot is about the same less surrounding erythema. Surface has adherent debris. Polymen Ag Right foot area significant wound between the first and second toes. We have been using silver alginate here  Left lateral leg polymen Ag at the base of his original venous insufficiency wound Left buttock some improvement here 3/1; Right foot is deteriorating in the first second toe webspace. Larger and more substantial. We have been using silver alginate. Left dorsal foot about the same markedly adherent surface debris using PolyMem Ag Left lateral calf surface debris using PolyMem AG Left buttock is improved again using PolyMem Ag. He is completing his terbinafine. The erythema in the foot seems better. He has been on this for 2 weeks 3/8; no improvement in any wound area in fact he has a small open area on the dorsal midfoot which is new this week. He has not gotten his foot x-rays yet 3/15; his x-rays were both negative for osteomyelitis of both feet. No major change in any of his wounds on the extremities however his buttock wounds are better. We have been using polymen on the buttocks, left lower leg. Iodoflex on the left foot and silver alginate on the right 3/22; arrives in clinic today with the 2 major issues are the improvement in the left dorsal foot wound which for once actually looks healthy with a nice healthy wound surface without debridement. Using Iodoflex here. Unfortunately on the left lateral calf which is in the distal part of his original wound he came to the clinic here for there was purulent drainage noted some  increased breakdown scattered around the original area and a small area proximally. We we are using polymen here will change to silver alginate today. His buttock wound on the left is better and I think the area on the right first second toe webspace is also improved 3/29; left dorsal foot looks better. Using Iodoflex. Left ankle culture from deterioration last time grew E. coli, Enterobacter and Enterococcus. I will give him a course of cefdinir although that will not cover Enterococcus. The area on the right foot in the webspace of the first and second toe lateral first toe looks better. The area on his buttock is about healed Vascular appointment is on April 21. This is to look at his venous system vis--vis continued breakdown of the wounds on the left including the left lateral leg and left dorsal foot he. He has had previous ablations on this side 4/5; the area between the right first and second toes lateral aspect of the first toe looks better. Dorsal aspect of the left first toe on the left foot also improved. Unfortunately the left lateral lower leg is larger and there is a second satellite wound superiorly. The usual superficial abrasions on the left buttock overall better but certainly not closed 4/12; the area between the right first and second toes is improved. Dorsal aspect of the left foot also slightly smaller with a vibrant healthy looking surface. No real change in the left lateral leg and the left buttock wound is healed Canipe, Teshawn E ([id number]) [id number] Page 12 of 37 He has an unaffordable co-pay for Apligraf. Appointment with vein and vascular with regards to the left leg venous part of the circulation is on 4/21 4/19; we continue to see improvement in all wound areas. Although this is minor. He has his vascular appointment on 4/21. The area on the left buttock has not reopened although right in the center of this area the skin looks somewhat  threatened 4/26; the left buttock is unfortunately reopened. In general his left dorsal foot has a healthy surface and looks somewhat smaller although it was not measured as such. The area  between his first and second toe webspace on the right as a small wound against the first toe. The patient saw vascular surgery. The real question I was asking was about the small saphenous vein on the left. He has previously ablated left greater saphenous vein. Nothing further was commented on on the left. Right greater saphenous vein without reflux at the saphenofemoral junction or proximal thigh there was no indication for ablation of the right greater saphenous vein duplex was negative for DVT bilaterally. They did not think there was anything from a vascular surgery point of view that could be offered. They ABIs within normal limits 5/3; only small open area on the left buttock. The area on the left lateral leg which was his original venous reflux is now 2 wounds both which look clean. We are using Iodoflex on the left dorsal foot which looks healthy and smaller. He is down to a very tiny area between the right first and second toes, using silver alginate 5/10; all of his wounds appear better. We have much better edema control in 4 layer compression on the left. This may be the factor that is allowing the left foot and left lateral calf to heal. He has external compression garments at home 04/14/20-All of his wounds are progressing well, the left forefoot is practically closed, left ischium appears to be about the same, right toe webspace is also smaller. The left lateral leg is about the same, continue using Hydrofera Blue to this, silver alginate to the ischium, Iodoflex to the toe space on the right 6/7; most of his wounds outside of the left buttock are doing well. The area on the left lateral calf and left dorsal foot are smaller. The area on the right foot in between the first and second toe webspace is  barely visible although he still says there is some drainage here is the only reason I did not heal this out. Unfortunately the area on the left buttock almost looks like he has a skin tear from tape. He has open wound and then a large flap of skin that we are trying to get adherence over an area just next to the remaining wound 6/21; 2 week follow-up. I believe is been here for nurse visits. Miraculously the area between his first and second toes on the left dorsal foot is closed over. Still open on the right first second web space. The left lateral calf has 2 open areas. Distally this is more superficial. The proximal area had a little more depth and required debridement of adherent necrotic material. His buttock wound is actually larger we have been using silver alginate here 6/28; the patient's area on the left foot remains closed. Still open wet area between the first and second toes on the right and also extending into the plantar aspect. We have been using silver alginate in this location. He has 2 areas on the left lower leg part of his original long wounds which I think are better. We have been using Hydrofera Blue here. Hydrofera Blue to the left buttock which is stable 7/12; left foot remains closed. Left ankle is closed. May be a small area between his right first and second toes the only truly open area is on the left buttock. We have been using Hydrofera Blue here 7/19; patient arrives with marked deterioration especially in the left foot and ankle. We did not put him in a compression wrap on the left last week in fact he wore his juxta  lite stockings on either side although he does not have an underlying stocking. He has a reopening on the left dorsal foot, left lateral ankle and a new area on the right dorsal ankle. More worrisome is the degree of erythema on the left foot extending on the lateral foot into the lateral lower leg on the left 7/26; the patient had erythema and drainage  from the lateral left ankle last week. Culture of this grew MRSA resistant to doxycycline and clindamycin which are the 2 antibiotics we usually use with this patient who has multiple antibiotic allergies including linezolid, trimethoprim sulfamethoxazole. I had give him an empiric doxycycline and he comes in the area certainly looks somewhat better although it is blotchy in his lower leg. He has not been systemically unwell. He has had areas on the left dorsal foot which is a reopening, chronic wounds on the left lateral ankle. Both of these I think are secondary to chronic venous insufficiency. The area between his first and second toes is closed as far as I can tell. He had a new wrap injury on the right dorsal ankle last week. Finally he has an area on the left buttock. We have been using silver alginate to everything except the left buttock we are using Hydrofera Blue 06/30/20-Patient returns at 1 week, has been given a sample dose pack of NUZYRA which is a tetracycline derivative [omadacycline], patient has completed those, we have been using silver alginate to almost all the wounds except the left ischium where we are using Hydrofera Blue all of them look better 8/16; since I last saw the patient he has been doing well. The area on the left buttock, left lateral ankle and left foot are all closed today. He has completed the [place] I gave him last time and tolerated this well. He still has open areas on the right dorsal ankle and in the right first second toe area which we are using silver alginate. 8/23; we put him in his bilateral external compression stockings last week as he did not have anything open on either leg except for concerning area between the right first and second toe. He comes in today with an area on the left dorsal foot slightly more proximal than the original wound, the left lateral foot but this is actually a continuation of the area he had on the left lateral ankle from last  time. As well he is opened up on the left buttock again. 8/30; comes in today with things looking a lot better. The area on the left lower ankle has closed down as has the left foot but with eschar in both areas. The area on the dorsal right ankle is also epithelialized. Very little remaining of the left buttock wound. We have been using silver alginate on all wound areas 9/13; the area in the first second toe webspace on the right has fully epithelialized. He still has some vulnerable epithelium on the right and the ankle and the dorsal foot. He notes weeping. He is using his juxta lite stocking. On the left again the left dorsal foot is closed left lateral ankle is closed. We went to the juxta lite stocking here as well. Still vulnerable in the left buttock although only 2 small open areas remain here 9/27; 2-week follow-up. We did not look at his left leg but the patient says everything is closed. He is a bit disturbed by the amount of edema in his left foot he is using juxta lite stockings  but asking about over the toes stockings which would be 30/40, will talk to him next time. According to him there is no open wound on either the left foot or the left ankle/calf He has an open area on the dorsal right calf which I initially point a wrap injury. He has superficial remaining wound on the left ischial tuberosity been using silver alginate although he says this sticks to the wound 10/5; we gave him 2-week follow-up but he called yesterday expressing some concerns about his right foot right ankle and the left buttock. He came in early. There is still no open areas on the left leg and that still in his juxta lite stocking 10/11; he only has 1 small area on the left buttock that remains measuring millimeters 1 mm. Still has the same irritated skin in this area. We recommended zinc oxide when this eventually closes and pressure relief is meticulously is he can do this. He still has an area on the dorsal  part of his right first through third toes which is a bit irritated and still open and on the dorsal ankle near the crease of the ankle. We have been using silver alginate and using his own stocking. He has nothing open on the left leg or foot 10/25; 2-week follow-up. Not nearly as good on the left buttock as I was hoping. For open areas with 5 looking threatened small. He has the erythematous irritated chronic skin in this area. 1 area on the right dorsal ankle. He reports this area bleeds easily Right dorsal foot just proximal to the base of his toes We have been using silver alginate. 11/8; 2-week follow-up. Left buttock is about the same although I do not think the wounds are in the same location we have been using silver alginate. I have asked him to use zinc oxide on the skin around the wounds. He still has a small area on the right dorsal ankle he reports this bleeds easily Right dorsal foot just proximal to the base of the toes does not have anything open although the skin is very dry and scaly He has a new opening on the nailbed of the left great toe. Nothing on the left ankle 11/29; 3-week follow-up. Left buttock has 2 open areas. And washing of these wounds today started bleeding easily. Suggesting very friable tissue. We have been using silver alginate. Right dorsal ankle which I thought was initially a wrap injury we have been using silver alginate. Nothing open between the toes that I can see. He states the area on the left dorsal toe nailbed healed after the last visit in 2 or 3 days 12/13; 3-week follow-up. His left buttock now has 3 open areas but the original 2 areas are smaller using polymen here. Surrounding skin looks better. The right dorsal ankle is closed. He has a small opening on the right dorsal foot at the level of the third toe. In general the skin looks better here. He is wearing his juxta lite stocking on the left leg says there is nothing open 11/24/2020; 3 weeks  follow-up. His left buttock still has the 3 open areas. We have been using polymen but due to lack of response he changed to [facility] area. Surrounding skin is dry erythematous and irritated looking. There is no evidence of infection either bacterial or fungal however there is loss of surface epithelium He still has very dry skin in his foot causing irritation and erythema on the dorsal part of  his toes. This is not responded to prolonged courses of antifungal simply looks dry and irritated 1/24; left buttock area still looks about the same he was unable to find the triad ointment that we had suggested. The area on the right lower leg just above the dorsal ankle has reopened and the areas on the right foot between the first second and second third toes and scaling on the bottom of the foot has been about the same for quite some time now. been using silver alginate to all wound areas 2/7; left buttock wound looked quite good although not much smaller in terms of surface area surrounding skin looks better. Only a few dry flaking areas on the right foot in between the first and second toes the skin generally looks better here [ammonium lactate]. Finally the area on the right dorsal ankle is closed Mackowiak, Domnique E ([id number]) [phone].pdf Page 13 of 36 2/21; There is no open area on the right foot even between the right first and second toe. Skin around this area dorsally and plantar aspects look better. He has a reopening of the area on the right ankle just above the crease of the ankle dorsally. I continue to think that this is probably friction from spasms may be even this time with his stocking under the compression stockings. Wounds on his left buttock look about the same there a couple of areas that have reopened. He has a total square area of loss of epithelialization. This does not look like infection it looks like a contact dermatitis but I just cannot determine to  what 3/14; there is nothing on the right foot between the first and second toes this was carefully inspected under illumination. Some chronic irritation on the dorsal part of his foot from toes 1-3 at the base. Nothing really open here substantially. Still has an area on the right foot/ankle that is actually larger and hyper granulated. His buttock area on the left is just about closed however he has chronic inflammation with loss of the surface epithelial layer 3/28; 2-week follow-up. In clinic today with a new wound on the left anterior mid tibia. Says this happened about 2 weeks ago. He is not really sure how wonders about the spasticity of his legs at night whether that could have caused this other than that he does not have a good idea. He has been using topical antibiotics and silver alginate. The area on his right dorsal ankle seems somewhat better. Finally everything on his left buttock is closed. 4/11; 2-week follow-up. All of his wounds are better except for the area over the ischium and left buttock which have opened up widely again. At least part of this is covered in necrotic fibrinous material another part had rolled nonviable skin. The area on the right ankle, left anterior mid tibia are both a lot better. He had no open wounds on either foot including the areas between the first and second toes 4/25; patient presents for 2-week follow-up. He states that the wounds are overall stable. He has no complaints today and states he is using Hydrofera Blue to open wounds. 5/9; have not seen this man in over a month. For my memory he has open areas on the left mid tibia and right ankle. T oday he has new open area on the right dorsal foot which we have not had a problem with recently. He has the sustained area on the left buttock He is also changed his insurance at the beginning of  the year [employer]. We will need prior authorizations for debridement 5/23; patient presents for 2-week  follow-up. He has prior authorizations for debridement. He denies any issues in the past 2 weeks with his wound care. He has been using Hydrofera Blue to all the wounds. He does report a circular rash to the upper left leg that is new. He denies acute signs of infection. 6/6; 2-week follow-up. The patient has open wounds on the left buttock which are worse than the last time I saw this about a month ago. He also has a new area to me on the left anterior mid tibia with some surrounding erythema. The area on the dorsal ankle on the right is closed but I think this will be a friction injury every time this area is exposed to either our wraps or his compression stockings caused by unrelenting spasms in this leg. 6/20; 2-week follow-up. The patient has open wounds on the left buttock which is about the same. Using [facility] here. - The left mid tibia has a static amount of surrounding erythema. Also a raised area in the center. We have been using Hydrofera Blue here. Finally he has broken down in his dorsal right foot extending between the first and second toes and going to the base of the first and second toe webspace. I have previously assumed that this was severe venous hypertension 7/5; 2-week follow-up The left buttock wound actually looks better. We are using Hydrofera Blue. He has extensive skin irritation around this area and I have not really been able to get that any better. I have tried Lotrisone i.e. antifungals and steroids. More most recently we have just been using Coloplast really looks about the same. The left mid tibia which was new last week culture to have very resistant staph aureus. Not only methicillin-resistant but doxycycline resistant. The patient has a plethora of antibiotic allergies including sulfa, linezolid. I used topical bacitracin on this but he has not started this yet. In addition he has an expanding area of erythema with a wound on the dorsal right foot. I did a  deep tissue culture of this area today 7/12; Left buttock area actually looks better surrounding skin also looks less irritated. Left mid tibia looks about the same. He is using bacitracin this is not worse Right dorsal foot looks about the same as well. The left first toe also looks about the same 7/19; left buttock wound continues to improve in terms of open areas Left mid tibia is still concerning amount of swelling he is using bacitracin Dorsal left first toe somewhat smaller Right dorsal foot somewhat smaller 7/25; left buttock wound actually continues to improve Left mid tibia area has less swelling. I gave him all my samples of new Nuzyra. This seems to have helped although the wound is still open it. His abrasion closed by here Left dorsal great toe really no better. Still a very nonviable surface Right dorsal foot perhaps some better. We have been using bacitracin and silver nitrate to the areas on his lower legs and Hydrofera Blue to the area on the buttock. 8/16 Disappointed that his left buttock wound is actually more substantial. Apparently during the last nurse visit these were both very small. He has continued irritation to a large area of skin on his buttock. I have never been able to totally explain this although I think it some combination of the way he sits, pressure, moisture. He is not incontinent enough to contribute to this.  Left dorsal great toe still fibrinous debris on the surface that I have debrided today Large area across the dorsal right toes. The area on the left anterior mid tibia has less swelling. He completed the [place]. This does not look infected although the tissue is still fried 8/30; 2-week follow-up. Left buttock areas not improved. We used Hydrofera Blue on this. Weeping wet with the surrounding erythema that I have not been able to control even with Lotrisone and topical Coloplast Left dorsal great toe looks about the same More substantial area  again at the base of his toes on the left which is new this week. Area across the dorsal right toes looks improved The left anterior mid tibia looks like it is trying to close 9/13; 2-week follow-up. Using silver alginate on all of his wounds. The left dorsal foot does not look any better. He has the area on the dorsal toe and also the areas at the base of all of his toes 1 through 3. On the right foot he has a similar pattern in a similar area. He has the area on his left mid tibia that looks fairly healthy. Finally the area on his left buttock looks somewhat bette 9/20; culture I did of the left foot which was a deep tissue culture last time showed E. coli he has erythema around this wound. Still a completely necrotic surface. His right dorsal foot looks about the same. He has a very friable surface to the left anterior mid tibia. Both buttock wounds look better. We have been using silver alginate to all wounds 10/4; he has completed the cephalexin that I gave him last time for the left foot. He is using topical gentamicin under silver alginate silver alginate being applied to all the wounds. Unfortunately all the wounds look irritated on his dorsal right foot dorsal left foot left mid tibia. I wonder if this could be a silver allergy. I am going to change him to [facility] on the lower extremity. The skin on the left buttock and left posterior thigh still flaking dry and irritated. This has continued no matter what I have applied topically to this. He has a solitary open wound which by itself does not look too bad however the entire area of surrounding skin does not change no matter what we have applied here 10/18; the area on the left dorsal foot and right dorsal foot both look better. The area on the right extends into the plantar but not between his toes. We have been using silver alginate. He still has a rectangular erythematous area around the area on the left tibia. The wound itself is  very small. Finally everything on his left buttock looks a little larger the skin is erythematous 11/15; patient comes in with the left dorsal and right dorsal foot distally looking somewhat better. Still nonviable surface on the left foot which required debridement. He still has the area on the left anterior mid tibia although this looks somewhat better. He has a new area on the right lateral lower leg just above the ankle. Finally his left buttock looks terrible with multiple superficial open wounds geometric square shaped area of chronic erythema which I have not been able to sort through 11/29; right dorsal foot and left dorsal foot both look somewhat better. No debridement required. He has the fragile area on the left anterior mid tibia this looks Rothschild, Undray E ([id number]) [phone].pdf Page 14 of 36 and continues to look somewhat better. Right lateral  lower leg just above the ankle we identified last time also looks better. In general the area on his left buttock looks improved. We are using Hydrofera Blue to all wound areas 12/13; right dorsal foot looks better. The area on the right lateral leg is healed. Left dorsal foot has 2 open areas both of which require debridement. The fragile area on the left anterior mid tibial looks better. Smaller area on his buttocks. Were using Hydrofera Blue 1/10; patient comes in with everything looking slightly larger and/or worse. This includes his left buttock, reopening of the left mid tibia, larger areas on the left dorsal foot and what looks to be a cellulitis on the right dorsal and plantar foot. We have been using Hydrofera Blue on all wounds. 1/17; right dorsal foot distally looks better today. The left foot has 2 open wounds that are about the same surrounding erythema. Culture I did last week showed rare Enterococcus and a multidrug resistant MRSA. 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 No malignancy they did not stain for fungus 1/24; his right distal foot is not closed dry and scaly but the wound looks like it is contracting. I did not debride anything here. Problem on his left dorsal foot with expanding erythema. Apparently there were problems last week getting the Elesa Hacker however it is now available at the [employer] [facility] but a week later. He is using ketoconazole and Coloplast to the left buttock along with Hydrofera Blue this actually looks quite a bit better today. 1/31; right dorsal foot again is dry and scaly but looking to contract. He has been using a moisturizer on his feet at my request but he is not sure which 1. The left dorsal foot wounds look about the same there is erythema here that I marked last week however after course of Nuzyra it certainly is not any better but not any worse either. Finally on his left buttock the skin continues to look better he has the original wound but a new substantial area towards the gluteal cleft. Almost like a skin tear. I used scissors to remove skin and subcutaneous tissue here silver nitrate and direct pressure 2/7; right dorsal foot. This does not look too much different from last week. Some erythema skin dry and scaly. No debridement. Left dorsal great toe again still not much improvement. I did remove flaking dry skin and callus from around the edge. Finally on his left buttock. The skin is somewhat better in the periwound. Surface wounds are superficial somewhat better than last week. 01/26/2022: Is a little bit of a mystery as to why his wounds fail to respond to treatments and actually seem to get worse. This is my first encounter with this patient. He was previously followed by Dr. Dellia Nims. Based upon my review of the chart, it seems that there is a little bit of a mystery as to why his wounds do not respond as anticipated to the interventions applied and sometimes even get worse. Biopsies have  been performed and he was seen by dermatology in [place], but that did not shed any light on the matter. T oday, his gluteal wound is larger, with substantial drainage, rather malodorous. The food wounds are not terrible, but he has a lot of callus and scaly skin around these. He is currently getting silver alginate on the gluteal wound, with idodoflex to the feet. He is using lotrisone on his legs for the dry, scaly skin. 02/09/2022: There  has really been no change to any of his wounds. The gluteal wound less drainage and odor, but remains about the same size, the periwound skin remains oddly scaly. His lower extremity wounds also appear roughly the same size. They continue to accumulate a small amount of slough. The periwound on his feet and ankle wounds has dry eschar and loose dead skin. We have been using silver alginate on the gluteal wound and Iodoflex on his feet and ankle wounds. T the periwound around his gluteal lesion and Lotrisone on his feet and legs. o 02/23/2022: The right plantar foot wound is closed. The gluteal site looks small but has continued to produce hypertrophic granulation tissue. The foot wounds all look about the same on the dorsal surface of the right foot; on the left, there is only a small open area at the site of where his left great toenail would have been. 03/16/2022: The right ankle wound is healed. The right dorsal foot wound is about the same. The left dorsal foot wound is quite a bit smaller and the ischial wound is nearly closed. 03/30/2022: The right ankle wound reopened. Both dorsal foot wounds are quite a bit smaller. Unfortunately, he appears to have sheared part of his ischial wound open further, perhaps during a transfer. 04/13/2022: The right ankle wound has hypertrophic granulation tissue present. The dorsal foot wounds continue to decrease in size. The ischial wound looks about the same today, no better, no worse. 04/27/2022: The right ankle wound has  closed. Unfortunately, it looks like some moisture got underneath the dry skin on both of his dorsal feet and these wounds have expanded in size. The ischial wound remains the same with perhaps a little bit more slough accumulation than at our previous visit. 05/11/2022: The right ankle wound remains closed. There is a left anterior tibial wound that is small has patchy openings with accumulated slough. The dorsum of his right foot appears to be nearly healed with just a small punctate opening. The plantar surface of his right foot has a new opening that looks like he may have picked some skin there. His sacral ulcer has hypertrophic granulation tissue but has some slough accumulation. The dorsum of his left foot has multiple open areas in a fairly ragged distribution. All of these have slough accumulated within them. 06/01/2022: The right ankle and left anterior tibial wound are both closed. Dorsum of his right foot and left foot both look substantially better with just tiny scattered openings The without any slough accumulation. He has sheared open new areas on his left gluteus and ischium. He says that his wheelchair cushion, which is air-filled, has a leak and so it keeps deflating. He is awaiting a new cushion. 06/15/2022: The right ankle wound has reopened and the fat layer is exposed. Both dorsal feet have just small openings with just a little bit of slough and eschar accumulation. The wound on his left gluteus and ischium is larger again today and has a foul odor. 06/29/2022: The right ankle wound has hypertrophic granulation tissue buildup. His dorsal foot wounds both look better with just some eschar on the surface. He has a new wound on his left lateral ankle. He is not sure how he acquired it but by appearance, it looks that he hit it on something, potentially his wheelchair or bed. The ischial wound is about the same but is cleaner without any significant purulent drainage or odor. He did not  understand what the [facility] call was about  and therefore he does not have the topical compounded antibiotic. 07/13/2022: The right ankle wound again has hypertrophic granulation tissue, but less so than at his previous visit. The ischial wound has improved tremendously the use of the [facility] topical antibiotic. No significant change to the left lateral ankle wound; it is fibrotic with slough present. The skin of both of his feet, especially on the right, has a yeasty appearance. 08/10/2022: There is again hypertrophic granulation tissue on the right anterior ankle wound. Both feet are about the same. The left lateral ankle wound is a little bit desiccated and has some slough buildup. He unfortunately suffered a new injury when he was removing his pants and they caught his bandage which caused a large skin tear on his left ischium, just distal to the existing wound. The existing left ischial wound, however, is significantly better with just a little light slough on the surface. 08/31/2022: The right anterior ankle wound is a lot smaller today underneath some eschar. No accumulation of hypertrophic granulation tissue. The left lateral ankle wound has some slough on the surface but is better in terms of moisture balance this week. The left dorsal foot does not really have any openings on it today. The right dorsal foot has some slough and eschar accumulation. His gluteal ulcer is basically closed aside from 2 small areas that are oozing a bit. 09/21/2022: The right anterior ankle wound is down to just a tiny pinhole. The left lateral ankle wound has accumulated slough again, but moisture balance is good. Left and right dorsal feet both have 2 small openings with some slough in them. The gluteal ulcer, unfortunately has opened up substantially. 10/05/2022: The right anterior ankle wound has reopened and has a fair amount of slough on the surface. The left lateral ankle wound has accumulated more slough,  as well. He was approved for Apligraf, but the wound is not clean enough yet. There is some slough buildup on the dorsal right foot wound, as well as Osberg, Jovoni E ([id number]) [id number] Page 15 of 36 on his ischium. 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 10/26/2022: The right anterior ankle wound has closed again. The left lateral ankle wound looks quite a bit better. It is ready for Apligraf but we do not have 1 here for him. His ischium continues to get worse, with the wound larger and deeper. It really seems like this is a combination of pressure and friction. He has 2 new wounds, 1 on the plantar aspect of each foot. He says that he had some dry skin there that peeled away. Both are superficial. The dorsum of his left foot does not have any new wound opening. The dorsum of his right foot has a bit of slough accumulation. 11/24/2022: The right anterior ankle wound has 2 small open areas, both covered with eschar. The left lateral ankle wound has closed and considerably with just a little slough on the surface. The ischium has improved most significantly, having closed in most of the open area with just a few small remaining superficial openings. The dorsal foot wounds are small and superficial. The bottoms of his feet are closed. 12/07/2022: The right anterior ankle wound remains open with 2 small areas, again with slough and eschar present. The left lateral ankle wound is down to about half a centimeter with just some slough on the surface. His right dorsal foot has opened up more and  has both slough and eschar present. The ischium has continued to improve and now just has 2 small open areas that are very clean. Electronic Signature(s) Signed: 12/07/2022 9:35:13 AM By: Fredirick Maudlin MD FACS Entered By: Fredirick Maudlin on 12/07/2022  09:35:12 -------------------------------------------------------------------------------- Physical Exam Details Patient Name: Date of Service: Steward, [name] 12/07/2022 8:30 A M Medical Record Number: [id number] Patient Account Number: [id number] Date of Birth/Sex: Treating RN: [birth year] (37 y.o. M) Primary Care Provider: [place], [place] Other Clinician: Referring Provider: Treating Provider/Extender: Graciela Husbands,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 really help. I'm not sure why I only used 2 weeks of Iodoflex on this there is no evidence of surrounding infection. He has a small area on the right second toe which looks as though it's progressing towards closure 10/28/16; the wounds on his toes appear to be closed. No major change in the left lateral leg wound although the surface looks somewhat better using Iodoflex. He has had previous arterial studies that were normal. He has had reflux studies and is status post ablation although I don't have any exact notes on  which vein was ablated. I'll need to check the surgical record 11/04/16; he's had a reopening between the first and second toe on the left and right. No major change in the left lateral leg wound. There is what appears to be cellulitis of the left dorsal foot 11/18/16 the patient was hospitalized initially in [place] and then subsequently transferred to [facility] long and was admitted there from 11/09/16 through 11/12/16. He had developed progressive cellulitis on the right leg in spite of the doxycycline I gave him. I'd spoken to the hospitalist in [place] who was concerned about continuing leukocytosis. CT scan is what I suggested this was done which showed soft tissue swelling without evidence of osteomyelitis or an underlying abscess blood cultures were negative. At [facility] he was treated with vancomycin and Primaxin and then add an infectious disease consult. He was transitioned to Ceftaroline. He has been making progressive improvement. Overall a severe cellulitis of the right leg. He is been using silver alginate to her original wound on the left leg. The wounds in his toes on the right are closed there is a small open area on the base of the left second toe 11/26/15;  the patient's right leg is much better although there is still some edema here this could be reminiscent from his severe cellulitis likely on top of some degree of lymphedema. His left anterior leg wound has less surface slough as reported by her intake nurse. Small wound at the base of the left second toe 12/02/16; patient's right leg is better and there is no open wound here. His left anterior lateral leg wound continues to have a healthy-looking surface. Small wound at the base of the left second toe however there is erythema in the left forefoot which is worrisome 12/16/16; is no open wounds on his right leg. We took measurements for stockings. His left anterior lateral leg wound continues to have a healthy-looking surface. I'm  not sure where we were with the Apligraf run through his insurance. We have been using Iodoflex. He has a thick eschar on the left first second toe interface, I suspect this may be fungal however there is no visible open 12/23/16; no open wound on his right leg. He has 2 small areas left of the linear wound that was remaining last week. We have been using Prisma, I thought I have disclosed this week, we can only look forward to next week 01/03/17; the patient had concerning areas of erythema last week, already on doxycycline for UTI through his primary doctor. The erythema is absolutely no better there is warmth and swelling both medially from the left lateral leg wound and also the dorsal left foot. 01/06/17- Patient is here for follow-up evaluation of his left lateral leg ulcer and bilateral feet ulcers. He is on oral antibiotic therapy, tolerating that. Nursing staff and the patient states that the erythema is improved from Monday. 01/13/17; the predominant left lateral leg wound continues to be problematic. I had put Apligraf on him earlier this month once. However he subsequently developed what appeared to be an intense cellulitis around the left lateral leg wound. I gave him Dalvance I think on 2/12 perhaps 2/13 he continues on cefdinir. The erythema is still present but the warmth and swelling is improved. I am hopeful that the cellulitis part of this control. I wouldn't be surprised if there is an element of venous inflammation as well. 01/17/17. The erythema is present but better in the left leg. His left lateral leg wound still does not have a viable surface buttons certain parts of this long thin wound it appears like there has been improvement in dimensions. 01/20/17; the erythema still present but much better in the left leg. I'm thinking this is his usual degree of chronic venous inflammation. The wound on the left leg looks somewhat better. Is less surface slough 01/27/17; erythema is back to  the chronic venous inflammation. The wound on the left leg is somewhat better. I am back to the point where I like to try an Apligraf once again 02/10/17; slight improvement in wound dimensions. Apligraf #2. He is completing his doxycycline 02/14/17; patient arrives today having completed doxycycline last Thursday. This was supposed to be a nurse visit however once again he hasn't tense erythema from the medial part of his wound extending over the lower leg. Also erythema in his foot this is roughly in the same distribution as last time. He has baseline chronic venous inflammation however this is a lot worse than the baseline I have learned to accept the on him is baseline inflammation 02/24/17- patient is here for follow-up evaluation. He is tolerating compression therapy. His voicing no complaints or  concerns he is here anticipating an Apligraf 03/03/17; he arrives today with an adherent necrotic surface. I don't think this is surface is going to be amenable for Apligraf's. The erythema around his wound and on the left dorsal foot has resolved he is off antibiotics 03/10/17; better-looking surface today. I don't think he can tolerate Apligraf's. He tells me he had a wound VAC after a skin graft years ago to this area and they had difficulty with a seal. The erythema continues to be stable around this some degree of chronic venous inflammation but he also has recurrent cellulitis. We have been using Iodoflex 03/17/17; continued improvement in the surface and may be small changes in dimensions. Using Iodoflex which seems the only thing that will control his surface 03/24/17- He is here for follow up evaluation of his LLE lateral ulceration and ulcer to right dorsal foot/toe space. He is voicing no complaints or concerns, He is tolerating compression wrap. 03/31/17 arrives today with a much healthier looking wound on the left lower extremity. We have been using Iodoflex for a prolonged period of time which  has for the first time prepared and adequate looking wound bed although we have not had much in the way of wound dimension improvement. He also has a small wound between the first and second toe on the right 04/07/17; arrives today with a healthy-looking wound bed and at least the top 50% of this wound appears to be now her. No debridement was required I have changed him to [facility] last week after prolonged Iodoflex. He did not do well with Apligraf's. We've had a re-opening between the first and second toe on the right 04/14/17; arrives today with a healthier looking wound bed contractions and the top 50% of this wound and some on the lesser 50%. Wound bed appears healthy. The area between the first and second toe on the right still remains problematic 04/21/17; continued very gradual improvement. Using [facility] 04/28/17; continued very gradual improvement in the left lateral leg venous insufficiency wound. His periwound erythema is very mild. We have been using Hydrofera Blue. Wound is making progress especially in the superior 50% 05/05/17; he continues to have very gradual improvement in the left lateral venous insufficiency wound. Both in terms with an length rings are improving. I debrided this every 2 weeks with #5 curet and we have been using Hydrofera Blue and again making good progress With regards to the wounds between his right first and second toe which I thought might of been tinea pedis he is not making as much progress very dry scaly skin over the area. Also the area at the base of the left first and second toe in a similar condition 05/12/17; continued gradual improvement in the refractory left lateral venous insufficiency wound on the left. Dimension smaller. Surface still requiring debridement using Hydrofera Blue 05/19/17; continued gradual improvement in the refractory left lateral venous ulceration. Careful inspection of the wound bed underlying rumination suggested some  degree of epithelialization over the surface no debridement indicated. Continue Hydrofera Blue difficult areas between his toes first and third on the left than first and second on the right. I'm going to change to silver alginate from silver collagen. Continue ketoconazole as I suspect underlying tinea pedis 05/26/17; left lateral leg venous insufficiency wound. We've been using Hydrofera Blue. I believe that there is expanding epithelialization over the surface of the wound albeit not coming from the wound circumference. This is a bit of an odd situation  in which the epithelialization seems to be coming from the surface of the wound rather than in the exact circumference. There is still small open areas mostly along the lateral margin of the wound. ooHe has unchanged areas between the left first and second and the right first second toes which I been treating for tenia pedis 06/02/17; left lateral leg venous insufficiency wound. We have been using Hydrofera Blue. Somewhat smaller from the wound circumference. The surface of the wound remains a bit on it almost epithelialized sedation in appearance. I use an open curette today debridement in the surface of all of this especially the edges ooSmall open wounds remaining on the dorsal right first and second toe interspace and the plantar left first second toe and her face on the left 06/09/17; wound on the left lateral leg continues to be smaller but very gradual and very dry surface using Hydrofera Blue 06/16/17 requires weekly debridements now on the left lateral leg although this continues to contract. I changed to silver collagen last week because of dryness of the wound bed. Using Iodoflex to the areas on his first and second toes/web space bilaterally 06/24/17; patient with history of paraplegia also chronic venous insufficiency with lymphedema. Has a very difficult wound on the left lateral leg. This has been gradually reducing in terms of with but  comes in with a very dry adherent surface. High switch to silver collagen a week or so ago with hydrogel to keep the Gangi, Leith ([id number]) [phone].pdf Page 22 of 36 area moist. This is been refractory to multiple dressing attempts. He also has areas in his first and second toes bilaterally in the anterior and posterior web space. I had been using Iodoflex here after a prolonged course of silver alginate with ketoconazole was ineffective [question tinea pedis] 07/14/17; patient arrives today with a very difficult adherent material over his left lateral lower leg wound. He also has surrounding erythema and poorly controlled edema. He was switched his Santyl last visit which the nurses are applying once during his doctor visit and once on a nurse visit. He was also reduced to 2 layer compression I'm not exactly sure of the issue here. 07/21/17; better surface today after 1 week of Iodoflex. Significant cellulitis that we treated last week also better. [Doxycycline] 07/28/17 better surface today with now 2 weeks of Iodoflex. Significant cellulitis treated with doxycycline. He has now completed the doxycycline and he is back to his usual degree of chronic venous inflammation/stasis dermatitis. He reminds me he has had ablations surgery here 08/04/17; continued improvement with Iodoflex to the left lateral leg wound in terms of the surface of the wound although the dimensions are better. 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 Problematic areas on the plantar aspect of the first second toe web space on the left and the dorsal aspect of the first second toe web space on the right. At one point I felt these were probably related to chronic fungal infections in treated him aggressively for this although we have not made any improvement here. 08/11/17; left lateral leg. Surface continues to improve with the  Iodoflex although we are not seeing much improvement in overall wound dimensions. Areas on his plantar left foot and right foot show no improvement. In fact the right foot looks somewhat worse 08/18/17; left lateral leg. We changed to [facility] Blue last week after a prolonged course of Iodoflex which helps get the surface  better. It appears that the wound with is improved. Continue with difficult areas on the left dorsal first second and plantar first second on the right 09/01/17; patient arrives in clinic today having had a temperature of 103 yesterday. He was seen in the ER and [facility]. The patient was concerned he could have cellulitis again in the right leg however they diagnosed him with a UTI and he is now on Keflex. He has a history of cellulitis which is been recurrent and difficult but this is been in the left leg, in the past 5 use doxycycline. He does in and out catheterizations at home which are risk factors for UTI 09/08/17; patient will be completing his Keflex this weekend. The erythema on the left leg is considerably better. He has a new wound today on the medial part of the right leg small superficial almost looks like a skin tear. He has worsening of the area on the right dorsal first and second toe. His major area on the left lateral leg is better. Using Hydrofera Blue on all areas 09/15/17; gradual reduction in width on the long wound in the left lateral leg. No debridement required. He also has wounds on the plantar aspect of his left first second toe web space and on the dorsal aspect of the right first second toe web space. 09/22/17; there continues to be very gradual improvements in the dimensions of the left lateral leg wound. He hasn't round erythematous spot with might be pressure on his wheelchair. There is no evidence obviously of infection no purulence no warmth ooHe has a dry scaled area on the plantar aspect of the left first second toe ooImproved area on the dorsal  right first second toe. 09/29/17; left lateral leg wound continues to improve in dimensions mostly with an is still a fairly long but increasingly narrow wound. ooHe has a dry scaled area on the plantar aspect of his left first second toe web space ooIncreasingly concerning area on the dorsal right first second toe. In fact I am concerned today about possible cellulitis around this wound. The areas extending up his second toe and although there is deformities here almost appears to abut on the nailbed. 10/06/17; left lateral leg wound continues to make very gradual progress. Tissue culture I did from the right first second toe dorsal foot last time grew MRSA and enterococcus which was vancomycin sensitive. This was not sensitive to clindamycin or doxycycline. He is allergic to Zyvox and sulfa we have therefore arrange for him to have dalvance infusion tomorrow. He is had this in the past and tolerated it well 10/20/17; left lateral leg wound continues to make decent progress. This is certainly reduced in terms of with there is advancing epithelialization.ooThe cellulitis in the right foot looks better although he still has a deep wound in the dorsal aspect of the first second toe web space. Plantar left first toe web space on the left I think is making some progress 10/27/17; left lateral leg wound continues to make decent progress. Advancing epithelialization.using Hydrofera Blue ooThe right first second toe web space wound is better-looking using silver alginate ooImprovement in the left plantar first second toe web space. Again using silver alginate 11/03/17 left lateral leg wound continues to make decent progress albeit slowly. Using Hydrofera Blue ooThe right per second toe web space continues to be a very problematic looking punched out wound. I obtained a piece of tissue for deep culture I did extensively treated this for fungus. It is  difficult to imagine that this is a pressure area as  the patient states other than going outside he doesn't really wear shoes at home ooThe left plantar first second toe web space looked fairly senescent. Necrotic edges. This required debridement oochange to Hydrofera Blue to all wound areas 11/10/17; left lateral leg wound continues to contract. Using Hydrofera Blue ooOn the right dorsal first second toe web space dorsally. Culture I did of this area last week grew MRSA there is not an easy oral option in this patient was multiple antibiotic allergies or intolerances. This was only a rare culture isolate I'm therefore going to use Bactroban under silver alginate ooOn the left plantar first second toe web space. Debridement is required here. This is also unchanged 11/17/17; left lateral leg wound continues to contract using Hydrofera Blue this is no longer the major issue. ooThe major concern here is the right first second toe web space. He now has an open area going from dorsally to the plantar aspect. There is now wound on the inner lateral part of the first toe. Not a very viable surface on this. There is erythema spreading medially into the forefoot. ooNo major change in the left first second toe plantar wound 11/24/17; left lateral leg wound continues to contract using Hydrofera Blue. Nice improvement today ooThe right first second toe web space all of this looks a lot less angry than last week. I have given him clindamycin and topical Bactroban for MRSA and terbinafine for the possibility of underlining tinea pedis that I could not control with ketoconazole. Looks somewhat better ooThe area on the plantar left first second toe web space is weeping with dried debris around the wound 12/01/17; left lateral leg wound continues to contract he Hydrofera Blue. It is becoming thinner in terms of with nevertheless it is making good improvement. ooThe right first second toe web space looks less angry but still a large necrotic-looking wounds  starting on the plantar aspect of the right foot extending between the toes and now extensively on the base of the right second toe. I gave him clindamycin and topical Bactroban for MRSA anterior benefiting for the possibility of underlying tinea pedis. Not looking better today ooThe area on the left first/second toe looks better. Debrided of necrotic debris 12/05/17* the patient was worked in urgently today because over the weekend he found blood on his incontinence bad when he woke up. He was found to have an ulcer by his wife who does most of his wound care. He came in today for [place] to look at this. He has not had a history of wounds in his buttocks in spite of his paraplegia. 12/08/17; seen in follow-up today at his usual appointment. He was seen earlier this week and found to have a new wound on his buttock. We also follow him for wounds on the left lateral leg, left first second toe web space and right first second toe web space 12/15/17; we have been using Hydrofera Blue to the left lateral leg which has improved. The right first second toe web space has also improved. Left first second toe web space plantar aspect looks stable. The left buttock has worsened using Santyl. Apparently the buttock has drainage 12/22/17; we have been using Hydrofera Blue to the left lateral leg which continues to improve now 2 small wounds separated by normal skin. He tells [place] he had a fever up to 100 yesterday he is prone to UTIs but has not noted anything different.  He does in and out catheterizations. The area between the first and second toes today does not look good necrotic surface covered with what looks to be purulent drainage and erythema extending into the third toe. I had gotten this to something that I thought look better last time however it is not look good today. He also has a necrotic surface over the buttock wound which is expanded. I thought there might be infection under here so I removed a lot of  the surface with a #5 curet though nothing look like it really needed culturing. He is been using Santyl to this area 12/27/17; his original wound on the left lateral leg continues to improve using Hydrofera Blue. I gave him samples of Baxdella although he was unable to take them out of fear for an allergic reaction ["lump in his throat"].the culture I did of the purulent drainage from his second toe last week showed both enterococcus and a set Enterobacter I was also concerned about the erythema on the bottom of his foot although paradoxically although this looks somewhat better today. 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 We've been using silver alginate here. They came up today that he sleeps in a recliner, I'm not sure why but I asked him to stop this 01/03/18; his original wound we've been using Hydrofera Blue is now separated into 2 areas. ooUlcer on his left buttock is better he is off the recliner and sleeping in bed ooFinally both wound areas between his first and second toes also looks some better 01/10/18; his original wound on the left lateral leg is now separated into 2 wounds we've been using Hydrofera Blue ooUlcer on his left buttock has some drainage. There is a small probing site going into muscle layer superiorly.using silver alginate -He arrives today with a deep tissue injury on the left heel Rhein, Prescott E ([id number]) [id number].pdf Page 23 of 36 ooThe wound on the dorsal aspect of his first second toe on the left looks a lot betterusing silver alginate ketoconazole ooThe area on the first second toe web space on the right also looks a lot bette 01/17/18; his original wound on the left lateral leg continues to progress using Hydrofera Blue ooUlcer on his left buttock also is smaller surface healthier except for a small probing site going into the muscle  layer superiorly. 2.4 cm of tunneling in this area ooDTI on his left heel we have only been offloading. Looks better than last week no threatened open no evidence of infection oothe wound on the dorsal aspect of the first second toe on the left continues to look like it's regressing we have only been using silver alginate and terbinafine orally ooThe area in the first second toe web space on the right also looks to be a lot better using silver alginate and terbinafine I think this was prompted by tinea pedis 01/31/18; the patient was hospitalized in [place] last week apparently for a complicated UTI. He was discharged on cefepime he does in and out catheterizations. In the hospital he was discovered M I don't mild elevation of AST and ALT and the terbinafine was stopped.predictably the pressure ulcer on s his buttock looks betterusing silver alginate. The area on the left lateral leg also is better using Hydrofera Blue. The area between the first and second toes on the left better. First and second toes on the right still substantial but  better. Finally the DTI on the left heel has held together and looks like it's resolving 02/07/18-he is here in follow-up evaluation for multiple ulcerations. He has new injury to the lateral aspect of the last issue a pressure ulcer, he states this is from adhesive removal trauma. He states he has tried multiple adhesive products with no success. All other ulcers appear stable. The left heel DTI is resolving. We will continue with same treatment plan and follow-up next week. 02/14/18; follow-up for multiple areas. ooHe has a new area last week on the lateral aspect of his pressure ulcer more over the posterior trochanter. The original pressure ulcer looks quite stable has healthy granulation. 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 Almost fully  epithelialized on the original superior area using Hydrofera Blue ooDTI on the left heel has peeled off this week to reveal a small superficial wound under denuded skin and subcutaneous tissue ooBoth areas between the first and second toes look better including nothing open on the left 02/21/18; ooThe patient's wounds on his left ischial tuberosity and posterior left greater trochanter actually looked better. He has a large area of irritation around the area which I think is contact dermatitis. I am doubtful that this is fungal ooHis original wound on the left lateral calf continues to improve we have been using Hydrofera Blue ooThere is no open area in the left first second toe web space although there is a lot of thick callus ooThe DTI on the left heel required debridement today of necrotic surface eschar and subcutaneous tissue using silver alginate ooFinally the area on the right first second toe webspace continues to contract using silver alginate and ketoconazole 02/28/18 ooLeft ischial tuberosity wounds look better using silver alginate. ooOriginal wound on the left calf only has one small open area left using Hydrofera Blue ooDTI on the left heel required debridement mostly removing skin from around this wound surface. Using silver alginate ooThe areas on the right first/second toe web space using silver alginate and ketoconazole 03/08/18 on evaluation today patient appears to be doing decently well as best I can tell in regard to his wounds. This is the first time that I have seen him as he generally is followed by Dr. Dellia Nims. With that being said none of his wounds appear to be infected he does have an area where there is some skin covering what appears to be a new wound on the left dorsal surface of his great toe. This is right at the nail bed. With that being said I do believe that debrided away some of the excess skin can be of benefit in this regard. Otherwise he has been  tolerating the dressing changes without complication. 03/14/18; patient arrives today with the multiplicity of wounds that we are following. He has not been systemically unwell ooOriginal wound on the left lateral calf now only has 2 small open areas we've been using Hydrofera Blue which should continue ooThe deep tissue injury on the left heel requires debridement today. We've been using silver alginate ooThe left first second toe and the right first second toe are both are reminiscence what I think was tinea pedis. Apparently some of the callus Surface between the toes was removed last week when it started draining. ooPurulent drainage coming from the wound on the ischial tuberosity on the left. 03/21/18-He is here in follow-up evaluation for multiple wounds. 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 He tolerated debridement. The only change to last week's recommendations is to discontinue antifungal cream between toes. He will follow-up next week 03/28/18; following up for multiple wounds;Concern this week is streaking redness and swelling in the right foot. He is going to need antibiotics for this. 03/31/18; follow-up for right foot cellulitis. Streaking redness and swelling in the right foot on 03/28/18. 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 I put him on clindamycin 300 mg every 6 and brought him in for a quick check. He has an open wound between his first and second toes on the right foot as a potential source. 04/04/18; ooRight foot cellulitis is resolving he is completing clindamycin. This is truly good news ooLeft lateral calf wound which is initial wound only has one small open area inferiorly this is close to healing out. He has compression stockings. We will use Hydrofera Blue right down to the epithelialization of this ooNonviable surface on the left heel which was initially pressure  with a DTI. We've been using Hydrofera Blue. I'm going to switch this back to silver alginate ooLeft first second toe/tinea pedis this looks better using silver alginate ooRight first second toe tinea pedis using silver alginate ooLarge pressure ulcers on theLeft ischial tuberosity. Small wound here Looks better. I am uncertain about the surface over the large wound. Using silver alginate 04/11/18; ooCellulitis in the right foot is resolved ooLeft lateral calf wound which was his original wounds still has 2 tiny open areas remaining this is just about closed ooNonviable surface on the left heel is better but still requires debridement ooLeft first second toe/tinea pedis still open using silver alginate ooRight first second toe wound tinea pedis I asked him to go back to using ketoconazole and silver alginate ooLarge pressure ulcers on the left ischial tuberosity this shear injury here is resolved. Wound is smaller. No evidence of infection using silver alginate 04/18/18; ooPatient arrives with an intense area of cellulitis in the right mid lower calf extending into the right heel area. Bright red and warm. Smaller area on the left anterior leg. He has a significant history of MRSA. He will definitely need antibioticsoodoxycycline ooHe now has 2 open areas on the left ischial tuberosity the original large wound and now a satellite area which I think was above his initial satellite areas. Not a wonderful surface on this satellite area surrounding erythema which looks like pressure related. ooHis left lateral calf wound again his original wound is just about closed ooLeft heel pressure injury still requiring debridement ooLeft first second toe looks a lot better using silver alginate ooRight first second toe also using silver alginate and ketoconazole cream also looks better 04/20/18; the patient was worked in early today out of concerns with his cellulitis on the right leg. I had  started him on doxycycline. This was 2 days ago. His wife was concerned about the swelling in the area. Also concerned about the left buttock. He has not been systemically unwell no fever chills. No nausea vomiting or diarrhea 04/25/18; the patient's left buttock wound is continued to deteriorate he is using Hydrofera Blue. He is still completing clindamycin for the cellulitis on the right leg although all of this looks better. 05/02/18 ooLeft buttock wound still with a lot of drainage and a very tightly adherent fibrinous necrotic surface. He has a deeper area superiorly ooThe left lateral calf wound is still closed ooDTI  wound on the left heel necrotic surface especially the circumference using Iodoflex ooAreas between his left first second toe and right first second toe both look better. Dorsally and the right first second toe he had a necrotic surface although at smaller. In using silver alginate and ketoconazole. Mestas, YANCY KNOBLE ([id number]) [id number].pdf Page 24 of 36 I did a culture last week which was a deep tissue culture of the reminiscence of the open wound on the right first second toe dorsally. This grew a few Acinetobacter and a few methicillin-resistant staph aureus. Nevertheless the area actually this week looked better. I didn't feel the need to specifically address this at least in terms of systemic antibiotics. 05/09/18; wounds are measuring larger more drainage per our intake. We are using Santyl covered with alginate on the large superficial buttock wounds, Iodosorb on the left heel, ketoconazole and silver alginate to the dorsal first and second toes bilaterally. 05/16/18; ooThe area on his left buttock better in some aspects although the area superiorly over the ischial tuberosity required an extensive debridement.using Santyl ooLeft heel appears stable. Using Iodoflex ooThe areas between his first and second toes are not bad however there is  spreading erythema up the dorsal aspect of his left foot this looks like cellulitis again. He is insensate the erythema is really very brilliant.o Erysipelas He went to see an allergist days ago because he was itching part of this he had lab work done. This showed a white count of 15.1 with 70% neutrophils. Hemoglobin of 11.4 and a platelet count of 659,000. Last white count we had in Epic was a 2-1/2 years ago which was 25.9 but he was ill at the time. He was able to show me some lab work that was done by his primary physician the pattern is about the same. 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 But prompted me to go ahead and do x-rays of both feet and the pelvis rule out osteomyelitis. He also had a comprehensive metabolic panel this was reasonably normal his albumin was 3.7 liver function tests BUN/creatinine all normal 05/23/18; x-rays of both his feet from last week were negative for underlying pulmonary abnormality. The x-ray of his pelvis however showed mild irregularity in the left ischial which may represent some early osteomyelitis. The wound in the left ischial continues to get deeper clearly now exposed muscle. Each week necrotic surface material over this area. Whereas the rest of the wounds do not look so bad. ooThe left ischial wound we have been using Santyl and calcium alginate ooT the left heel surface necrotic debris using Iodoflex o ooThe left lateral leg is still healed ooAreas on the left dorsal foot and the right dorsal foot are about the same. There is some inflammation on the left which might represent contact dermatitis, fungal dermatitis I am doubtful cellulitis although this looks better than last week 05/30/18; CT scan done at Hospital did not show any osteomyelitis or abscess. Suggested the possibility of underlying cellulitis although I don't see a lot of evidence of this at the bedside ooThe wound itself on the left  buttock/upper thigh actually looks somewhat better. No debridement ooLeft heel also looks better no debridement continue Iodoflex ooBoth dorsal first second toe spaces appear better using Lotrisone. Left still required debridement 06/06/18; ooIntake reported some purulent looking drainage from the left gluteal wound. Using Santyl and calcium alginate ooLeft heel looks better although still a nonviable surface requiring debridement   ooThe left dorsal foot first/second webspace actually expanding and somewhat deeper. I may consider doing a shave biopsy of this area ooRight dorsal foot first/second webspace appears stable to improved. Using Lotrisone and silver alginate to both these areas 06/13/18 ooLeft gluteal surface looks better. Now separated in the 2 wounds. No debridement required. Still drainage. We'll continue silver alginate ooLeft heel continues to look better with Iodoflex continue this for at least another week ooOf his dorsal foot wounds the area on the left still has some depth although it looks better than last week. We've been using Lotrisone and silver alginate 06/20/18 ooLeft gluteal continues to look better healthy tissue ooLeft heel continues to look better healthy granulation wound is smaller. He is using Iodoflex and his long as this continues continue the Iodoflex ooDorsal right foot looks better unfortunately dorsal left foot does not. There is swelling and erythema of his forefoot. He had minor trauma to this several days ago but doesn't think this was enough to have caused any tissue injury. Foot looks like cellulitis, we have had this problem before 06/27/18 on evaluation today patient appears to be doing a little worse in regard to his foot ulcer. 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 Both of which would really be his  only options for treating this infection. In the past he has been given and effusion of Orbactiv. This is done very well for him in the past again it's one time dosing IV antibiotic therapy. Subsequently I do believe this is something we're gonna need to see about doing at this point in time. Currently his other wounds seem to be doing somewhat better in my pinion I'm pretty happy in that regard. 07/03/18 on evaluation today patient's wounds actually appear to be doing fairly well. He has been tolerating the dressing changes without complication. All in all he seems to be showing signs of improvement. In regard to the antibiotics he has been dealing with infectious disease since I saw him last week as far as getting this scheduled. In the end [facility]'s [facility] this coming Friday. In the meantime he has been continuing to perform the dressing changes in such as previous. There does not appear to be any evidence of infection worsengin at this time. 07/10/18; ooSince I last saw this man 2 weeks ago things have actually improved. IV antibiotics of resulted in less forefoot erythema although there is still some present. He is not systemically unwell ooLeft buttock wounds o2 now have no depth there is increased epithelialization Using silver alginate ooLeft heel still requires debridement using Iodoflex ooLeft dorsal foot still with a sizable wound about the size of a border but healthy granulation ooRight dorsal foot still with a slitlike area using silver alginate 07/18/18; the patient's cellulitis in the left foot is improved in fact I think it is on its way to resolving. ooLeft buttock wounds o2 both look better although the larger one has hypertension granulation we've been using silver alginate ooLeft heel has some thick circumferential redundant skin over the wound edge which will need to be removed today we've been using  Iodoflex ooLeft dorsal foot is still a sizable wound required debridement using silver alginate ooThe right dorsal foot is just about closed only a small open area remains here 07/25/18;  left foot cellulitis is resolved ooLeft buttock wounds o2 both look better. Hyper-granulation on the major area ooLeft heel as some debris over the surface but otherwise looks a healthier wound. Using silver collagen ooRight dorsal foot is just about closed 07/31/18; arrives with our intake nurse worried about purulent drainage from the buttock. We had hyper-granulation here last week ooHis buttock wounds o2 continue to look better ooLeft heel some debris over the surface but measuring smaller. ooRight dorsal foot unfortunately has openings between the toes ooLeft foot superficial wound looks less aggravated. 08/07/18 ooButtock wounds continue to look better although some of her granulation and the larger medial wound. silver alginate ooLeft heel continues to look a lot better.silver collagen ooLeft foot superficial wound looks less stable. Requires debridement. He has a new wound superficial area on the foot on the lateral dorsal foot. ooRight foot looks better using silver alginate without Lotrisone 08/14/2018; patient was in the ER last week diagnosed with a UTI. He is now on Cefpodoxime and Macrodantin. ooButtock wounds continued to be smaller. Using silver alginate ooLeft heel continues to look better using silver collagen ooLeft foot superficial wound looks as though it is improving ooRight dorsal foot area is just about healed. 08/21/2018; patient is completed his antibiotics for his UTI. ooHe has 2 open areas on the buttocks. There is still not closed although the surface looks satisfactory. Using silver alginate ooLeft heel continues to improve using silver collagen ooThe bilateral dorsal foot areas which are at the base of his first and second toes/possible tinea pedis are actually  stable on the left but worse on the right. The area on the left required debridement of necrotic surface. After debridement I obtained a specimen for PCR culture. ooThe right dorsal foot which is been just about healed last week is now reopened Cirillo, Wael E ([id number]) [phone].pdf Page 25 of 36 08/28/2018; culture done on the left dorsal foot showed coag negative staph both staph epidermidis and Lugdunensis. I think this is worthwhile initiating systemic treatment. I will use doxycycline given his long list of allergies. The area on the left heel slightly improved but still requiring debridement. ooThe large wound on the buttock is just about closed whereas the smaller one is larger. Using silver alginate in this area 09/04/2018; patient is completing his doxycycline for the left foot although this continues to be a very difficult wound area with very adherent necrotic debris. We are using silver alginate to all his wounds right foot left foot and the small wounds on his buttock, silver collagen on the left heel. 09/11/2018; once again this patient has intense erythema and swelling of the left forefoot. Lesser degrees of erythema in the right foot. He has a long list of allergies and intolerances. I will reinstitute doxycycline. 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 Using silver alginate ooLeft heel also looks better using silver collagen ooUnfortunately both the areas on his feet look worse. The area on the left first second webspace is now gone through to the plantar part of his foot. The area on the left foot anteriorly is irritated with erythema and swelling in the forefoot. 09/25/2018 ooHis wound on the left plantar heel looks better. Using silver collagen ooThe area on the left buttock 2 small remnant areas. One is closed one is still open. Using silver alginate ooThe areas between both his first and  second toes look worse. This in spite of long-standing antifungal  therapy with ketoconazole and silver alginate which should have antifungal activity ooHe has small areas around his original wound on the left calf one is on the bottom of the original scar tissue and one superiorly both of these are small and superficial but again given wound history in this site this is worrisome 10/02/2018 ooLeft plantar heel continues to gradually contract using silver collagen ooLeft buttock wound is unchanged using silver alginate ooThe areas on his dorsal feet between his first and second toes bilaterally look about the same. 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 Small open area here. We have been using silver alginate 10/09/2018; ooLeft plantar heel continues to look healthy and contract. No debridement is required ooLeft buttock slightly smaller with a tape injury wound just below which was new this week ooDorsal feet somewhat improved I have been using clindamycin ooLeft lateral looks lower extremity the actual open area looks worse although a lot of this is epithelialized. I am going to change to silver collagen today He has a lot more swelling in the right leg although this is not pitting not red and not particularly warm there is a lot of spasm in the right leg usually indicative of people with paralysis of some underlying discomfort. We have reviewed his vascular status from 2017 he had a left greater saphenous vein ablation. I wonder about referring him back to vascular surgery if the area on the left leg continues to deteriorate. 10/16/2018 in today for follow-up and management of multiple lower extremity ulcers. His left Buttock wound is much lower smaller and almost closed completely. The wound to the left  ankle has began to reopen with Epithelialization and some adherent slough. He has multiple new areas to the left foot and leg. The left dorsal foot without much improvement. Wound present between left great webspace and 2nd toe. Erythema and edema present right leg. Right LE ultrasound obtained on 10/10/18 was negative for DVT . 10/23/2018; ooLeft buttock is closed over. Still dry macerated skin but there is no open wound. I suspect this is chronic pressure/moisture ooLeft lateral calf is quite a bit worse than when I saw this last. There is clearly drainage here he has macerated skin into the left plantar heel. We will change the primary dressing to alginate ooLeft dorsal foot has some improvement in overall wound area. Still using clindamycin and silver alginate ooRight dorsal foot about the same as the left using clindamycin and silver alginate ooThe erythema in the right leg has resolved. He is DVT rule out was negative ooLeft heel pressure area required debridement although the wound is smaller and the surface is health 10/26/2018 ooThe patient came back in for his nurse check today predominantly because of the drainage coming out of the left lateral leg with a recent reopening of his original wound on the left lateral calf. He comes in today with a large amount of surrounding erythema around the wound extending from the calf into the ankle and even in the area on the dorsal foot. He is not systemically unwell. He is not febrile. Nevertheless this looks like cellulitis. We have been using silver alginate to the area. I changed him to a regular visit and I am going to prescribe him doxycycline. The rationale here is a long list of medication intolerances and a history of MRSA.  I did not see anything that I thought would provide a valuable culture 10/30/2018 ooFollow-up from his appointment 4 days ago with really an extensive area of cellulitis in the left calf left lateral ankle and left  dorsal foot. I put him on doxycycline. He has a long list of medication allergies which are true allergy reactions. 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 In any case he is a lot better today. The erythema especially in the anterior and lateral left calf is better. He still has left ankle erythema. He also is complaining about increasing edema in the right leg we have only been using Kerlix Coban and he has been doing the wraps at home. Finally he has a spotty rash on the medial part of his upper left calf which looks like folliculitis or perhaps wrap occlusion type injury. Small superficial macules not pustules 11/06/18 patient arrives today with again a considerable degree of erythema around the wound on the left lateral calf extending into the dorsal ankle and dorsal foot. This is a lot worse than when I saw this last week. He is on doxycycline really with not a lot of improvement. He has not been systemically unwell Wounds on the; left heel actually looks improved. Original area on the left foot and proximity to the first and second toes looks about the same. He has superficial areas on the dorsal foot, anterior calf and then the reopening of his original wound on the left lateral calf which looks about the same ooThe only area he has on the right is the dorsal webspace first and second which is smaller. ooHe has a large area of dry erythematous skin on the left buttock small open area here. 11/13/2018; the patient arrives in much better condition. The erythema around the wound on the left lateral calf is a lot better. Not sure whether this was the clindamycin or the TCA and ketoconazole or just in the improvement in edema control [stasis dermatitis]. In any case this is a lot better. The area on the left heel is very small and just about resolved using silver collagen we have been using silver alginate to the areas on his  dorsal feet 11/20/2018; his wounds include the left lateral calf, left heel, dorsal aspects of both feet just proximal to the first second webspace. He is stable to slightly improved. I did not think any changes to his dressings were going to be necessary 11/27/2018 he has a reopening on the left buttock which is surrounded by what looks like tinea or perhaps some other form of dermatitis. The area on the left dorsal foot has some erythema around it I have marked this area but I am not sure whether this is cellulitis or not. Left heel is not closed. Left calf the reopening is really slightly longer and probably worse 1/13; in general things look better and smaller except for the left dorsal foot. Area on the left heel is just about closed, left buttock looks better only a small wound remains in the skin looks better [using Lotrisone] 1/20; the area on the left heel only has a few remaining open areas here. Left lateral calf about the same in terms of size, left dorsal foot slightly larger right lateral foot still not closed. The area on the left buttock has no open wound and the surrounding skin looks a lot better 1/27; the area on the left heel is closed. Left  lateral calf better but still requiring extensive debridements. The area on his left buttock is closed. He still has the open areas on the left dorsal foot which is slightly smaller in the right foot which is slightly expanded. We have been using Iodoflex on these areas as well 2/3; left heel is closed. Left lateral calf still requiring debridement using Iodoflex there is no open area on his left buttock however he has dry scaly skin over a large area of this. Not really responding well to the Lotrisone. Finally the areas on his dorsal feet at the level of the first second webspace are slightly smaller on the right and about the same on the left. Both of these vigorously debrided with Anasept and gauze 2/10; left heel remains closed he has dry  erythematous skin over the left buttock but there is no open wound here. Left lateral leg has come in and with. Still requiring debridement we have been using Iodoflex here. Finally the area on the left dorsal foot and right dorsal foot are really about the same extremely dry callused fissured areas. He does not yet have a dermatology appointment 2/17; left heel remains closed. He has a new open area on the left buttock. The area on the left lateral calf is bigger longer and still covered in necrotic debris. Shiner, NADIM MALIA ([id number]) [phone].pdf Page 26 of 36 No major change in his foot areas bilaterally. I am awaiting for a dermatologist to look on this. We have been using ketoconazole I do not know that this is been doing any good at all. 2/24; left heel remains closed. The left buttock wound that was new reopening last week looks better. The left lateral calf appears better also although still requires debridement. The major area on his foot is the left first second also requiring debridement. We have been putting Prisma on all wounds. I do not believe that the ketoconazole has done too much good for his feet. He will use Lotrisone I am going to give him a 2-week course of terbinafine. We still do not have a dermatology appointment 3/2 left heel remains closed however there is skin over bone in this area I pointed this out to him today. The left buttock wound is epithelialized but still does not look completely stable. The area on the left leg required debridement were using silver collagen here. With regards to his feet we changed to Lotrisone last week and silver alginate. 3/9; left heel remains closed. Left buttock remains closed. The area on the right foot is essentially closed. The left foot remains unchanged. Slightly smaller on the left lateral calf. Using silver collagen to both of these areas 3/16-Left heel remains closed. Area on right foot is closed. Left  lateral calf above the lateral malleolus open wound requiring debridement with easy bleeding. Left dorsal wound proximal to first toe also debrided. Left ischial area open new. Patient has been using Prisma with wrapping every 3 days. Dermatology appointment is apparently tomorrow.Patient has completed his terbinafine 2-week course with some apparent improvement according to him, there is still flaking and dry skin in his foot on the left 3/23; area on the right foot is reopened. The area on the left anterior foot is about the same still a very necrotic adherent surface. He still has the area on the left leg and reopening is on the left buttock. He apparently saw dermatology although I do not have a note. According to the patient who is usually fairly well  informed they did not have any good ideas. Put him on oral terbinafine which she is been on before. 3/30; using silver collagen to all wounds. Apparently his dermatologist put him on doxycycline and rifampin presumably some culture grew staph. I do not have this result. He remains on terbinafine although I have used terbinafine on him before 4/6; patient has had a fairly substantial reopening on the right foot between the first and second toes. He is finished his terbinafine and I believe is on doxycycline and rifampin still as prescribed by dermatology. We have been using silver collagen to all his wounds although the patient reports that he thinks silver alginate does better on the wounds on his buttock. 4/13; the area on his left lateral calf about the same size but it did not require debridement. ooLeft dorsal foot just proximal to the webspace between the first and second toes is about the same. Still nonviable surface. I note some superficial bronze discoloration of the dorsal part of his foot ooRight dorsal foot just proximal to the first and second toes also looks about the same. I still think there may be the same discoloration I noted  above on the left ooLeft buttock wound looks about the same 4/20; left lateral calf appears to be gradually contracting using silver collagen. 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 The left dorsal foot wound is debrided of tightly adherent necrotic debris and really cleans up quite nicely. The right area is worse with expansion. I did not debride this it is now over the base of the second toe ooThe area on his left buttock is smaller no debridement is required using silver collagen 5/4; left calf continues to make good progress. ooHe arrives with erythema around the wounds on his dorsal foot which even extends to the plantar aspect. Very concerning for coexistent infection. He is finished the erythromycin I gave him for possible erythrasma this does not seem to have helped. ooThe area on the left foot is about the same base of the dorsal toes ooIs area on the buttock looks improved on the left 5/11; left calf and left buttock continued to make good progress. Left foot is about the same to slightly improved. ooMajor problem is on the right foot. He has not had an x-ray. Deep tissue culture I did last week showed both Enterobacter and E. coli. I did not change the doxycycline I put him on empirically although neither 1 of these were plated to doxycycline. He arrives today with the erythema looking worse on both the dorsal and plantar foot. Macerated skin on the bottom of the foot. he has not been systemically unwell 5/18-Patient returns at 1 week, left calf wound appears to be making some progress, left buttock wound appears slightly worse than last time, left foot wound looks slightly better, right foot redness is marginally better. X-ray of both feet show no air or evidence of osteomyelitis. Patient is finished his Omnicef and terbinafine. He continues to have macerated skin on the bottom of the left foot as well as right 5/26;  left calf wound is better, left buttock wound appears to have multiple small superficial open areas with surrounding macerated skin. X-rays that I did last time showed no evidence of osteomyelitis in either foot. He is finished cefdinir and doxycycline. I do not think that he was on terbinafine. He continues to have a large superficial open area on the right foot anterior dorsal and slightly between  the first and second toes. I did send him to dermatology 2 months ago or so wondering about whether they would do a fungal scraping. I do not believe they did but did do a culture. We have been using silver alginate to the toe areas, he has been using antifungals at home topically either ketoconazole or Lotrisone. We are using silver collagen on the left foot, silver alginate on the right, silver collagen on the left lateral leg and silver alginate on the left buttock 6/1; left buttock area is healed. We have the left dorsal foot, left lateral leg and right dorsal foot. We are using silver alginate to the areas on both feet and silver collagen to the area on his left lateral calf 6/8; the left buttock apparently reopened late last week. He is not really sure how this happened. He is tolerating the terbinafine. Using silver alginate to all wounds 6/15; left buttock wound is larger than last week but still superficial. ooCame in the clinic today with a report of purulence from the left lateral leg I did not identify any infection ooBoth areas on his dorsal feet appear to be better. He is tolerating the terbinafine. Using silver alginate to all wounds 6/22; left buttock is about the same this week, left calf quite a bit better. His left foot is about the same however he comes in with erythema and warmth in the right forefoot once again. Culture that I gave him in the beginning of May showed Enterobacter and E. coli. I gave him doxycycline and things seem to improve although neither 1 of these organisms was  specifically plated. 6/29; left buttock is larger and dry this week. Left lateral calf looks to me to be improved. Left dorsal foot also somewhat improved right foot completely unchanged. The erythema on the right foot is still present. He is completing the Ceftin dinner that I gave him empirically [see discussion above.) 7/6 - All wounds look to be stable and perhaps improved, the left buttock wound is slightly smaller, per patient bleeds easily, completed ceftin, the right foot redness is less, he is on terbinafine 7/13; left buttock wound about the same perhaps slightly narrower. Area on the left lateral leg continues to narrow. Left dorsal foot slightly smaller right foot about the same. We are using silver alginate on the right foot and Hydrofera Blue to the areas on the left. Unna boot on the left 2 layer compression on the right 7/20; left buttock wound absolutely the same. Area on lateral leg continues to get better. Left dorsal foot require debridement as did the right no major change in the 7/27; left buttock wound the same size necrotic debris over the surface. The area on the lateral leg is closed once again. His left foot looks better right foot about the same although there is some involvement now of the posterior first second toe area. He is still on terbinafine which I have given him for a month, not certain a centimeter major change 06/25/19-All wounds appear to be slightly improved according to report, left buttock wound looks clean, both foot wounds have minimal to no debris the right dorsal foot has minimal slough. We are using Hydrofera Blue to the left and silver alginate to the right foot and ischial wound. 8/10-Wounds all appear to be around the same, the right forefoot distal part has some redness which was not there before, however the wound looks clean and small. Ischial wound looks about the same with no changes  8/17; his wound on the left lateral calf which was his  original chronic venous insufficiency wound remains closed. Since I last saw him the areas on the left dorsal foot right dorsal foot generally appear better but require debridement. The area on his left initial tuberosity appears somewhat larger to me perhaps hyper granulated and bleeds very easily. We have been using Hydrofera Blue to the left dorsal foot and silver alginate to everything else 8/24; left lateral calf remains closed. The areas on his dorsal feet on the webspace of the first and second toes bilaterally both look better. The area on the left buttock which is the pressure ulcer stage II slightly smaller. I change the dressing to Hydrofera Blue to all areas 8/31; left lateral calf remains closed. The area on his dorsal feet bilaterally look better. Using Hydrofera Blue. Still requiring debridement on the left foot. No change in the left buttock pressure ulcers however 9/14; left lateral calf remains closed. Dorsal feet look quite a bit better than 2 weeks ago. Flaking dry skin also a lot better with the ammonium lactate I gave him 2 weeks ago. The area on the left buttock is improved. He states that his Roho cushion developed a leak and he is getting a new one, in the interim he is offloading this vigorously Stephenson, [place] ([id number]) [phone].pdf Page 27 of 36 9/21; left calf remains closed. Left heel which was a possible DTI looks better this week. He had macerated tissue around the left dorsal foot right foot looks satisfactory and improved left buttock wound. I changed his dressings to his feet to silver alginate bilaterally. Continuing Hydrofera Blue on the left buttock. 9/28 left calf remains closed. Left heel did not develop anything [possible DTI] dry flaking skin on the left dorsal foot. Right foot looks satisfactory. Improved left buttock wound. We are using silver alginate on his feet Hydrofera Blue on the buttock. I have asked him to go back to the  Lotrisone on his feet including the wounds and surrounding areas 10/5; left calf remains closed. The areas on the left and right feet about the same. A lot of this is epithelialized however debris over the remaining open areas. He is using Lotrisone and silver alginate. The area on the left buttock using Hydrofera Blue 10/26. Patient has been out for 3 weeks secondary to Covid concerns. He tested negative but I think his wife tested positive. He comes in today with the left foot substantially worse, right foot about the same. Even more concerning he states that the area on his left buttock closed over but then reopened and is considerably deeper in one aspect than it was before [stage III wound] 11/2; left foot really about the same as last week. Quarter sized wound on the dorsal foot just proximal to the first second toes. Surrounding erythema with areas of denuded epithelium. This is not really much different looking. Did not look like cellulitis this time however. ooRight foot area about the same.. We have been using silver alginate alginate on his toes ooLeft buttock still substantial irritated skin around the wound which I think looks somewhat better. We have been using Hydrofera Blue here. 11/9; left foot larger than last week and a very necrotic surface. Right foot I think is about the same perhaps slightly smaller. Debris around the circumference also addressed. Unfortunately on the left buttock there is been a decline. Satellite lesions below the major wound distally and now a an additional one posteriorly we  have been using Hydrofera Blue but I think this is a pressure issue 11/16; left foot ulcer dorsally again a very adherent necrotic surface. Right foot is about the same. Not much change in the pressure ulcer on his left buttock. 11/30; left foot ulcer dorsally basically the same as when I saw him 2 weeks ago. Very adherent fibrinous debris on the wound surface. Patient reports a lot  of drainage as well. 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 We have been using Iodoflex, patient changed back to alginate because of the drainage. Area on his right dorsal foot really looks benign with a healthier surface certainly a lot better than on the left. Left buttock wounds all improved using Hydrofera Blue 12/7; left dorsal foot again no improvement. Tightly adherent debris. PCR culture I did last week only showed likely skin contaminant. I have gone ahead and done a punch biopsy of this which is about the last thing in terms of investigations I can think to do. He has known venous insufficiency and venous hypertension and this could be the issue here. The area on the right foot is about the same left buttock slightly worse according to our intake nurse secondary to [facility] Blue sticking to the wound 12/14; biopsy of the left foot that I did last time showed changes that could be related to wound healing/chronic stasis dermatitis phenomenon no neoplasm. We have been using silver alginate to both feet. I change the one on the left today to Sorbact and silver alginate to his other 2 wounds 12/28; the patient arrives with the following problems; ooMajor issue is the dorsal left foot which continues to be a larger deeper wound area. Still with a completely nonviable surface ooParadoxically the area mirror image on the right on the right dorsal foot appears to be getting better. ooHe had some loss of dry denuded skin from the lower part of his original wound on the left lateral calf. Some of this area looked a little vulnerable and for this reason we put him in wrap that on this side this week ooThe area on his left buttock is larger. He still has the erythematous circular area which I think is a combination of pressure, sweat. This does not look like cellulitis or fungal dermatitis 11/26/2019; -Dorsal left foot large open wound with  depth. Still debris over the surface. Using Sorbact ooThe area on the dorsal right foot paradoxically has closed over [facility] has a reopening on the left ankle laterally at the base of his original wound that extended up into the calf. This appears clean. ooThe left buttock wound is smaller but with very adherent necrotic debris over the surface. We have been using silver alginate here as well The patient had arterial studies done in 2017. He had biphasic waveforms at the dorsalis pedis and posterior tibial bilaterally. ABI in the left was 1.17. Digit waveforms were dampened. He has slight spasticity in the great toes I do not think a TBI would be possible 1/11; the patient comes in today with a sizable reopening between the first and second toes on the right. This is not exactly in the same location where we have been treating wounds previously. According to our intake nurse this was actually fairly deep but 0.6 cm. The area on the left dorsal foot looks about the same the surface is somewhat cleaner using Sorbact, his MRI is in 2 days. We have not managed yet to get arterial  studies. The new reopening on the left lateral calf looks somewhat better using alginate. The left buttock wound is about the same using alginate 1/18; the patient had his ARTERIAL studies which were quite normal. ABI in the right at 1.13 with triphasic/biphasic waveforms on the left ABI 1.06 again with triphasic/biphasic waveforms. It would not have been possible to have done a toe brachial index because of spasticity. We have been using Sorbac to the left foot alginate to the rest of his wounds on the right foot left lateral calf and left buttock 1/25; arrives in clinic with erythema and swelling of the left forefoot worse over the first MTP area. This extends laterally dorsally and but also posteriorly. Still has an area on the left lateral part of the lower part of his calf wound it is eschared and clearly not  closed. ooArea on the left buttock still with surrounding irritation and erythema. ooRight foot surface wound dorsally. The area between the right and first and second toes appears better. 2/1; ooThe left foot wound is about the same. Erythema slightly better I gave him a week of doxycycline empirically ooRight foot wound is more extensive extending between the toes to the plantar surface ooLeft lateral calf really no open surface on the inferior part of his original wound however the entire area still looks vulnerable ooAbsolutely no improvement in the left buttock wound required debridement. 2/8; the left foot is about the same. Erythema is slightly improved I gave him clindamycin last week. ooRight foot looks better he is using Lotrimin and silver alginate ooHe has a breakdown in the left lateral calf. Denuded epithelium which I have removed ooLeft buttock about the same were using Hydrofera Blue 2/15; left foot is about the same there is less surrounding erythema. Surface still has tightly adherent debris which I have debriding however not making any progress ooRight foot has a substantial wound on the medial right second toe between the first and second webspace. ooStill an open area on the left lateral calf distal area. ooButtock wound is about the same 2/22; left foot is about the same less surrounding erythema. Surface has adherent debris. Polymen Ag Right foot area significant wound between the first and second toes. We have been using silver alginate here Left lateral leg polymen Ag at the base of his original venous insufficiency wound ooLeft buttock some improvement here 3/1; ooRight foot is deteriorating in the first second toe webspace. Larger and more substantial. We have been using silver alginate. ooLeft dorsal foot about the same markedly adherent surface debris using PolyMem Ag ooLeft lateral calf surface debris using PolyMem AG ooLeft buttock is improved  again using PolyMem Ag. ooHe is completing his terbinafine. The erythema in the foot seems better. He has been on this for 2 weeks 3/8; no improvement in any wound area in fact he has a small open area on the dorsal midfoot which is new this week. He has not gotten his foot x-rays yet 3/15; his x-rays were both negative for osteomyelitis of both feet. No major change in any of his wounds on the extremities however his buttock wounds are better. We have been using polymen on the buttocks, left lower leg. Iodoflex on the left foot and silver alginate on the right 3/22; arrives in clinic today with the 2 major issues are the improvement in the left dorsal foot wound which for once actually looks healthy with a nice healthy wound surface without debridement. Using Iodoflex here. Unfortunately on the  left lateral calf which is in the distal part of his original wound he came to the clinic here for there was purulent drainage noted some increased breakdown scattered around the original area and a small area proximally. We we are using polymen here will change to silver alginate today. His buttock wound on the left is better and I think the area on the right first second toe webspace is also improved Grammatico, Hodari E ([id number]) [phone].pdf Page 28 of 36 3/29; left dorsal foot looks better. Using Iodoflex. Left ankle culture from deterioration last time grew E. coli, Enterobacter and Enterococcus. I will give him a course of cefdinir although that will not cover Enterococcus. The area on the right foot in the webspace of the first and second toe lateral first toe looks better. The area on his buttock is about healed Vascular appointment is on April 21. This is to look at his venous system vis--vis continued breakdown of the wounds on the left including the left lateral leg and left dorsal foot he. He has had previous ablations on this side 4/5; the area between the right first and  second toes lateral aspect of the first toe looks better. Dorsal aspect of the left first toe on the left foot also improved. Unfortunately the left lateral lower leg is larger and there is a second satellite wound superiorly. The usual superficial abrasions on the left buttock overall better but certainly not closed 4/12; the area between the right first and second toes is improved. Dorsal aspect of the left foot also slightly smaller with a vibrant healthy looking surface. No real change in the left lateral leg and the left buttock wound is healed He has an unaffordable co-pay for Apligraf. Appointment with vein and vascular with regards to the left leg venous part of the circulation is on 4/21 4/19; we continue to see improvement in all wound areas. Although this is minor. He has his vascular appointment on 4/21. The area on the left buttock has not reopened although right in the center of this area the skin looks somewhat threatened 4/26; the left buttock is unfortunately reopened. In general his left dorsal foot has a healthy surface and looks somewhat smaller although it was not measured as such. The area between his first and second toe webspace on the right as a small wound against the first toe. The patient saw vascular surgery. The real question I was asking was about the small saphenous vein on the left. He has previously ablated left greater saphenous vein. Nothing further was commented on on the left. Right greater saphenous vein without reflux at the saphenofemoral junction or proximal thigh there was no indication for ablation of the right greater saphenous vein duplex was negative for DVT bilaterally. They did not think there was anything from a vascular surgery point of view that could be offered. They ABIs within normal limits 5/3; only small open area on the left buttock. The area on the left lateral leg which was his original venous reflux is now 2 wounds both which look clean.  We are using Iodoflex on the left dorsal foot which looks healthy and smaller. He is down to a very tiny area between the right first and second toes, using silver alginate 5/10; all of his wounds appear better. We have much better edema control in 4 layer compression on the left. This may be the factor that is allowing the left foot and left lateral calf to heal. He has external  compression garments at home 04/14/20-All of his wounds are progressing well, the left forefoot is practically closed, left ischium appears to be about the same, right toe webspace is also smaller. The left lateral leg is about the same, continue using Hydrofera Blue to this, silver alginate to the ischium, Iodoflex to the toe space on the right 6/7; most of his wounds outside of the left buttock are doing well. The area on the left lateral calf and left dorsal foot are smaller. The area on the right foot in between the first and second toe webspace is barely visible although he still says there is some drainage here is the only reason I did not heal this out. ooUnfortunately the area on the left buttock almost looks like he has a skin tear from tape. He has open wound and then a large flap of skin that we are trying to get adherence over an area just next to the remaining wound 6/21; 2 week follow-up. I believe is been here for nurse visits. Miraculously the area between his first and second toes on the left dorsal foot is closed over. Still open on the right first second web space. The left lateral calf has 2 open areas. Distally this is more superficial. The proximal area had a little more depth and required debridement of adherent necrotic material. His buttock wound is actually larger we have been using silver alginate here 6/28; the patient's area on the left foot remains closed. Still open wet area between the first and second toes on the right and also extending into the plantar aspect. We have been using silver  alginate in this location. He has 2 areas on the left lower leg part of his original long wounds which I think are better. We have been using Hydrofera Blue here. Hydrofera Blue to the left buttock which is stable 7/12; left foot remains closed. Left ankle is closed. May be a small area between his right first and second toes the only truly open area is on the left buttock. We have been using Hydrofera Blue here 7/19; patient arrives with marked deterioration especially in the left foot and ankle. We did not put him in a compression wrap on the left last week in fact he wore his juxta lite stockings on either side although he does not have an underlying stocking. He has a reopening on the left dorsal foot, left lateral ankle and a new area on the right dorsal ankle. More worrisome is the degree of erythema on the left foot extending on the lateral foot into the lateral lower leg on the left 7/26; the patient had erythema and drainage from the lateral left ankle last week. Culture of this grew MRSA resistant to doxycycline and clindamycin which are the 2 antibiotics we usually use with this patient who has multiple antibiotic allergies including linezolid, trimethoprim sulfamethoxazole. I had give him an empiric doxycycline and he comes in the area certainly looks somewhat better although it is blotchy in his lower leg. He has not been systemically unwell. He has had areas on the left dorsal foot which is a reopening, chronic wounds on the left lateral ankle. Both of these I think are secondary to chronic venous insufficiency. The area between his first and second toes is closed as far as I can tell. He had a new wrap injury on the right dorsal ankle last week. Finally he has an area on the left buttock. We have been using silver alginate to everything  except the left buttock we are using Hydrofera Blue 06/30/20-Patient returns at 1 week, has been given a sample dose pack of NUZYRA which is a tetracycline  derivative [omadacycline], patient has completed those, we have been using silver alginate to almost all the wounds except the left ischium where we are using Hydrofera Blue all of them look better 8/16; since I last saw the patient he has been doing well. The area on the left buttock, left lateral ankle and left foot are all closed today. He has completed the [place] I gave him last time and tolerated this well. He still has open areas on the right dorsal ankle and in the right first second toe area which we are using silver alginate. 8/23; we put him in his bilateral external compression stockings last week as he did not have anything open on either leg except for concerning area between the right first and second toe. He comes in today with an area on the left dorsal foot slightly more proximal than the original wound, the left lateral foot but this is actually a continuation of the area he had on the left lateral ankle from last time. As well he is opened up on the left buttock again. 8/30; comes in today with things looking a lot better. The area on the left lower ankle has closed down as has the left foot but with eschar in both areas. The area on the dorsal right ankle is also epithelialized. Very little remaining of the left buttock wound. We have been using silver alginate on all wound areas 9/13; the area in the first second toe webspace on the right has fully epithelialized. He still has some vulnerable epithelium on the right and the ankle and the dorsal foot. He notes weeping. He is using his juxta lite stocking. On the left again the left dorsal foot is closed left lateral ankle is closed. We went to the juxta lite stocking here as well. ooStill vulnerable in the left buttock although only 2 small open areas remain here 9/27; 2-week follow-up. We did not look at his left leg but the patient says everything is closed. He is a bit disturbed by the amount of edema in his left foot he is  using juxta lite stockings but asking about over the toes stockings which would be 30/40, will talk to him next time. According to him there is no open wound on either the left foot or the left ankle/calf He has an open area on the dorsal right calf which I initially point a wrap injury. He has superficial remaining wound on the left ischial tuberosity been using silver alginate although he says this sticks to the wound 10/5; we gave him 2-week follow-up but he called yesterday expressing some concerns about his right foot right ankle and the left buttock. He came in early. There is still no open areas on the left leg and that still in his juxta lite stocking 10/11; he only has 1 small area on the left buttock that remains measuring millimeters 1 mm. Still has the same irritated skin in this area. We recommended zinc oxide when this eventually closes and pressure relief is meticulously is he can do this. He still has an area on the dorsal part of his right first through third toes which is a bit irritated and still open and on the dorsal ankle near the crease of the ankle. We have been using silver alginate and using his own stocking. He has  nothing open on the left leg or foot 10/25; 2-week follow-up. Not nearly as good on the left buttock as I was hoping. For open areas with 5 looking threatened small. He has the erythematous irritated chronic skin in this area. oo1 area on the right dorsal ankle. He reports this area bleeds easily ooRight dorsal foot just proximal to the base of his toes ooWe have been using silver alginate. 11/8; 2-week follow-up. Left buttock is about the same although I do not think the wounds are in the same location we have been using silver alginate. I have asked him to use zinc oxide on the skin around the wounds. ooHe still has a small area on the right dorsal ankle he reports this bleeds easily ooRight dorsal foot just proximal to the base of the toes does not have  anything open although the skin is very dry and scaly ooHe has a new opening on the nailbed of the left great toe. Nothing on the left ankle 11/29; 3-week follow-up. Left buttock has 2 open areas. And washing of these wounds today started bleeding easily. Suggesting very friable tissue. We have been using silver alginate. Right dorsal ankle which I thought was initially a wrap injury we have been using silver alginate. Nothing open between the toes that I can see. He states the area on the left dorsal toe nailbed healed after the last visit in 2 or 3 days 12/13; 3-week follow-up. His left buttock now has 3 open areas but the original 2 areas are smaller using polymen here. Surrounding skin looks better. The right Chance, Ashon E ([id number]) [id number].pdf Page 29 of 36 dorsal ankle is closed. He has a small opening on the right dorsal foot at the level of the third toe. In general the skin looks better here. He is wearing his juxta lite stocking on the left leg says there is nothing open 11/24/2020; 3 weeks follow-up. His left buttock still has the 3 open areas. We have been using polymen but due to lack of response he changed to [facility] area. Surrounding skin is dry erythematous and irritated looking. There is no evidence of infection either bacterial or fungal however there is loss of surface epithelium ooHe still has very dry skin in his foot causing irritation and erythema on the dorsal part of his toes. This is not responded to prolonged courses of antifungal simply looks dry and irritated 1/24; left buttock area still looks about the same he was unable to find the triad ointment that we had suggested. The area on the right lower leg just above the dorsal ankle has reopened and the areas on the right foot between the first second and second third toes and scaling on the bottom of the foot has been about the same for quite some time now. been using silver alginate to  all wound areas 2/7; left buttock wound looked quite good although not much smaller in terms of surface area surrounding skin looks better. Only a few dry flaking areas on the right foot in between the first and second toes the skin generally looks better here [ammonium lactate]. Finally the area on the right dorsal ankle is closed 2/21; ooThere is no open area on the right foot even between the right first and second toe. Skin around this area dorsally and plantar aspects look better. ooHe has a reopening of the area on the right ankle just above the crease of the ankle dorsally. I continue to think that this is  probably friction from spasms may be even this time with his stocking under the compression stockings. ooWounds on his left buttock look about the same there a couple of areas that have reopened. He has a total square area of loss of epithelialization. This does not look like infection it looks like a contact dermatitis but I just cannot determine to what 3/14; there is nothing on the right foot between the first and second toes this was carefully inspected under illumination. Some chronic irritation on the dorsal part of his foot from toes 1-3 at the base. Nothing really open here substantially. Still has an area on the right foot/ankle that is actually larger and hyper granulated. His buttock area on the left is just about closed however he has chronic inflammation with loss of the surface epithelial layer 3/28; 2-week follow-up. In clinic today with a new wound on the left anterior mid tibia. Says this happened about 2 weeks ago. He is not really sure how wonders about the spasticity of his legs at night whether that could have caused this other than that he does not have a good idea. He has been using topical antibiotics and silver alginate. The area on his right dorsal ankle seems somewhat better. ooFinally everything on his left buttock is closed. 4/11; 2-week follow-up. All of  his wounds are better except for the area over the ischium and left buttock which have opened up widely again. At least part of this is covered in necrotic fibrinous material another part had rolled nonviable skin. The area on the right ankle, left anterior mid tibia are both a lot better. He had no open wounds on either foot including the areas between the first and second toes 4/25; patient presents for 2-week follow-up. He states that the wounds are overall stable. He has no complaints today and states he is using Hydrofera Blue to open wounds. 5/9; have not seen this man in over a month. For my memory he has open areas on the left mid tibia and right ankle. T oday he has new open area on the right dorsal foot which we have not had a problem with recently. He has the sustained area on the left buttock He is also changed his insurance at the beginning of the year [employer]. We will need prior authorizations for debridement 5/23; patient presents for 2-week follow-up. He has prior authorizations for debridement. He denies any issues in the past 2 weeks with his wound care. He has been using Hydrofera Blue to all the wounds. He does report a circular rash to the upper left leg that is new. He denies acute signs of infection. 6/6; 2-week follow-up. The patient has open wounds on the left buttock which are worse than the last time I saw this about a month ago. He also has a new area to me on the left anterior mid tibia with some surrounding erythema. The area on the dorsal ankle on the right is closed but I think this will be a friction injury every time this area is exposed to either our wraps or his compression stockings caused by unrelenting spasms in this leg. 6/20; 2-week follow-up. oo The patient has open wounds on the left buttock which is about the same. Using [facility] here. - The left mid tibia has a static amount of surrounding erythema. Also a raised area in the center. We have  been using Hydrofera Blue here. ooooFinally he has broken down in his dorsal right foot  extending between the first and second toes and going to the base of the first and second toe webspace. I have previously assumed that this was severe venous hypertension 7/5; 2-week follow-up oo The left buttock wound actually looks better. We are using Hydrofera Blue. He has extensive skin irritation around this area and I have not really been able to get that any better. I have tried Lotrisone i.e. antifungals and steroids. More most recently we have just been using Coloplast really looks about the same. oo The left mid tibia which was new last week culture to have very resistant staph aureus. Not only methicillin-resistant but doxycycline resistant. The patient has a plethora of antibiotic allergies including sulfa, linezolid. I used topical bacitracin on this but he has not started this yet. oo In addition he has an expanding area of erythema with a wound on the dorsal right foot. I did a deep tissue culture of this area today 7/12; oo Left buttock area actually looks better surrounding skin also looks less irritated. oo Left mid tibia looks about the same. He is using bacitracin this is not worse oo Right dorsal foot looks about the same as well. oo The left first toe also looks about the same 7/19; left buttock wound continues to improve in terms of open areas oo Left mid tibia is still concerning amount of swelling he is using bacitracin oo Dorsal left first toe somewhat smaller oo Right dorsal foot somewhat smaller 7/25; left buttock wound actually continues to improve oo Left mid tibia area has less swelling. I gave him all my samples of new Nuzyra. This seems to have helped although the wound is still open it. His abrasion closed by here oo Left dorsal great toe really no better. Still a very nonviable surface oo Right dorsal foot perhaps some better. oo We have been using  bacitracin and silver nitrate to the areas on his lower legs and Hydrofera Blue to the area on the buttock. 8/16 oo Disappointed that his left buttock wound is actually more substantial. Apparently during the last nurse visit these were both very small. He has continued irritation to a large area of skin on his buttock. I have never been able to totally explain this although I think it some combination of the way he sits, pressure, moisture. He is not incontinent enough to contribute to this. oo Left dorsal great toe still fibrinous debris on the surface that I have debrided today oo Large area across the dorsal right toes. oo The area on the left anterior mid tibia has less swelling. He completed the [place]. This does not look infected although the tissue is still fried 8/30; 2-week follow-up. oo Left buttock areas not improved. We used Hydrofera Blue on this. Weeping wet with the surrounding erythema that I have not been able to control even with Lotrisone and topical Coloplast oo Left dorsal great toe looks about the same oo More substantial area again at the base of his toes on the left which is new this week. oo Area across the dorsal right toes looks improved oo The left anterior mid tibia looks like it is trying to close 9/13; 2-week follow-up. Using silver alginate on all of his wounds. The left dorsal foot does not look any better. He has the area on the dorsal toe and also the areas at the base of all of his toes 1 through 3. On the right foot he has a similar pattern in a similar area. He has  the area on his left mid tibia that looks fairly healthy. Finally the area on his left buttock looks somewhat bette 9/20; culture I did of the left foot which was a deep tissue culture last time showed E. coli he has erythema around this wound. Still a completely necrotic surface. His right dorsal foot looks about the same. He has a very friable surface to the left anterior mid tibia. Both  buttock wounds look better. We have been using silver alginate to all wounds 10/4; he has completed the cephalexin that I gave him last time for the left foot. He is using topical gentamicin under silver alginate silver alginate being applied to all the wounds. Unfortunately all the wounds look irritated on his dorsal right foot dorsal left foot left mid tibia. I wonder if this could be a silver allergy. I am going to change him to [employer] on the lower extremity. Reek, DEQUAVIOUS HARSHBERGER ([id number]) [phone].pdf Page 30 of 36 The skin on the left buttock and left posterior thigh still flaking dry and irritated. This has continued no matter what I have applied topically to this. He has a solitary open wound which by itself does not look too bad however the entire area of surrounding skin does not change no matter what we have applied here 10/18; the area on the left dorsal foot and right dorsal foot both look better. The area on the right extends into the plantar but not between his toes. We have been using silver alginate. He still has a rectangular erythematous area around the area on the left tibia. The wound itself is very small. Finally everything on his left buttock looks a little larger the skin is erythematous 11/15; patient comes in with the left dorsal and right dorsal foot distally looking somewhat better. Still nonviable surface on the left foot which required debridement. He still has the area on the left anterior mid tibia although this looks somewhat better. He has a new area on the right lateral lower leg just above the ankle. Finally his left buttock looks terrible with multiple superficial open wounds geometric square shaped area of chronic erythema which I have not been able to sort through 11/29; right dorsal foot and left dorsal foot both look somewhat better. No debridement required. He has the fragile area on the left anterior mid tibia this looks and  continues to look somewhat better. Right lateral lower leg just above the ankle we identified last time also looks better. In general the area on his left buttock looks improved. We are using Hydrofera Blue to all wound areas 12/13; right dorsal foot looks better. The area on the right lateral leg is healed. Left dorsal foot has 2 open areas both of which require debridement. The fragile area on the left anterior mid tibial looks better. Smaller area on his buttocks. Were using Hydrofera Blue 1/10; patient comes in with everything looking slightly larger and/or worse. This includes his left buttock, reopening of the left mid tibia, larger areas on the left dorsal foot and what looks to be a cellulitis on the right dorsal and plantar foot. We have been using Hydrofera Blue on all wounds. 1/17; right dorsal foot distally looks better today. The left foot has 2 open wounds that are about the same surrounding erythema. 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 No malignancy they did not stain for fungus  1/24; his right distal foot is not closed dry and scaly but the wound looks like it is contracting. I did not debride anything here. Problem on his left dorsal foot with expanding erythema. Apparently there were problems last week getting the Elesa Hacker however it is now available at the [facility] but a week later. He is using ketoconazole and Coloplast to the left buttock along with Hydrofera Blue this actually looks quite a bit better today. 1/31; right dorsal foot again is dry and scaly but looking to contract. He has been using a moisturizer on his feet at my request but he is not sure which 1. The left dorsal foot wounds look about the same there is erythema here that I marked last week however after course of Nuzyra it certainly is not any better but not any  worse either. Finally on his left buttock the skin continues to look better he has the original wound but a new substantial area towards the gluteal cleft. Almost like a skin tear. I used scissors to remove skin and subcutaneous tissue here silver nitrate and direct pressure 2/7; right dorsal foot. This does not look too much different from last week. Some erythema skin dry and scaly. No debridement. Left dorsal great toe again still not much improvement. I did remove flaking dry skin and callus from around the edge. Finally on his left buttock. The skin is somewhat better in the periwound. Surface wounds are superficial somewhat better than last week. 01/26/2022: Is a little bit of a mystery as to why his wounds fail to respond to treatments and actually seem to get worse. This is my first encounter with this patient. He was previously followed by Dr. Dellia Nims. Based upon my review of the chart, it seems that there is a little bit of a mystery as to why his wounds do not respond as anticipated to the interventions applied and sometimes even get worse. Biopsies have been performed and he was seen by dermatology in [place], but that did not shed any light on the matter. T oday, his gluteal wound is larger, with substantial drainage, rather malodorous. The food wounds are not terrible, but he has a lot of callus and scaly skin around these. He is currently getting silver alginate on the gluteal wound, with idodoflex to the feet. He is using lotrisone on his legs for the dry, scaly skin. 02/09/2022: There has really been no change to any of his wounds. The gluteal wound less drainage and odor, but remains about the same size, the periwound skin remains oddly scaly. His lower extremity wounds also appear roughly the same size. They continue to accumulate a small amount of slough. The periwound on his feet and ankle wounds has dry eschar and loose dead skin. We have been using silver alginate on the gluteal  wound and Iodoflex on his feet and ankle wounds. T the periwound around his gluteal lesion and Lotrisone on his feet and legs. o 02/23/2022: The right plantar foot wound is closed. The gluteal site looks small but has continued to produce hypertrophic granulation tissue. The foot wounds all look about the same on the dorsal surface of the right foot; on the left, there is only a small open area at the site of where his left great toenail would have been. 03/16/2022: The right ankle wound is healed. The right dorsal foot wound is about the same. The left dorsal foot wound is quite a bit smaller and the ischial wound is  nearly closed. 03/30/2022: The right ankle wound reopened. Both dorsal foot wounds are quite a bit smaller. Unfortunately, he appears to have sheared part of his ischial wound open further, perhaps during a transfer. 04/13/2022: The right ankle wound has hypertrophic granulation tissue present. The dorsal foot wounds continue to decrease in size. The ischial wound looks about the same today, no better, no worse. 04/27/2022: The right ankle wound has closed. Unfortunately, it looks like some moisture got underneath the dry skin on both of his dorsal feet and these wounds have expanded in size. The ischial wound remains the same with perhaps a little bit more slough accumulation than at our previous visit. 05/11/2022: The right ankle wound remains closed. There is a left anterior tibial wound that is small has patchy openings with accumulated slough. The dorsum of his right foot appears to be nearly healed with just a small punctate opening. The plantar surface of his right foot has a new opening that looks like he may have picked some skin there. His sacral ulcer has hypertrophic granulation tissue but has some slough accumulation. The dorsum of his left foot has multiple open areas in a fairly ragged distribution. All of these have slough accumulated within them. 06/01/2022: The right ankle and  left anterior tibial wound are both closed. Dorsum of his right foot and left foot both look substantially better with just tiny scattered openings The without any slough accumulation. He has sheared open new areas on his left gluteus and ischium. He says that his wheelchair cushion, which is air-filled, has a leak and so it keeps deflating. He is awaiting a new cushion. 06/15/2022: The right ankle wound has reopened and the fat layer is exposed. Both dorsal feet have just small openings with just a little bit of slough and eschar accumulation. The wound on his left gluteus and ischium is larger again today and has a foul odor. 06/29/2022: The right ankle wound has hypertrophic granulation tissue buildup. His dorsal foot wounds both look better with just some eschar on the surface. He has a new wound on his left lateral ankle. He is not sure how he acquired it but by appearance, it looks that he hit it on something, potentially his wheelchair or bed. The ischial wound is about the same but is cleaner without any significant purulent drainage or odor. He did not understand what the [facility] call was about and therefore he does not have the topical compounded antibiotic. 07/13/2022: The right ankle wound again has hypertrophic granulation tissue, but less so than at his previous visit. The ischial wound has improved tremendously the use of the [facility] topical antibiotic. No significant change to the left lateral ankle wound; it is fibrotic with slough present. The skin of both of his feet, especially on the right, has a yeasty appearance. 08/10/2022: There is again hypertrophic granulation tissue on the right anterior ankle wound. Both feet are about the same. The left lateral ankle wound is a little bit desiccated and has some slough buildup. He unfortunately suffered a new injury when he was removing his pants and they caught his bandage which Roel, Josephus E ([id number])  [phone].pdf Page 31 of 36 caused a large skin tear on his left ischium, just distal to the existing wound. The existing left ischial wound, however, is significantly better with just a little light slough on the surface. 08/31/2022: The right anterior ankle wound is a lot smaller today underneath some eschar. No accumulation of hypertrophic granulation tissue. The  left lateral ankle wound has some slough on the surface but is better in terms of moisture balance this week. The left dorsal foot does not really have any openings on it today. The right dorsal foot has some slough and eschar accumulation. His gluteal ulcer is basically closed aside from 2 small areas that are oozing a bit. 09/21/2022: The right anterior ankle wound is down to just a tiny pinhole. The left lateral ankle wound has accumulated slough again, but moisture balance is good. Left and right dorsal feet both have 2 small openings with some slough in them. The gluteal ulcer, unfortunately has opened up substantially. 10/05/2022: The right anterior ankle wound has reopened and has a fair amount of slough on the surface. The left lateral ankle wound has accumulated more slough, as well. He was approved for Apligraf, but the wound is not clean enough yet. There is some slough buildup on the dorsal right foot wound, as well as on his ischium. 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 10/26/2022: The right anterior ankle wound has closed again. The left lateral ankle wound looks quite a bit better. It is ready for Apligraf but we do not have 1 here for him. His ischium continues to get worse, with the wound larger and deeper. It really seems like this is a combination of pressure and friction. He has 2 new wounds, 1 on the plantar aspect of each foot. He says that he had some dry skin there that peeled away. Both are superficial. The dorsum of his left  foot does not have any new wound opening. The dorsum of his right foot has a bit of slough accumulation. 11/24/2022: The right anterior ankle wound has 2 small open areas, both covered with eschar. The left lateral ankle wound has closed and considerably with just a little slough on the surface. The ischium has improved most significantly, having closed in most of the open area with just a few small remaining superficial openings. The dorsal foot wounds are small and superficial. The bottoms of his feet are closed. 12/07/2022: The right anterior ankle wound remains open with 2 small areas, again with slough and eschar present. The left lateral ankle wound is down to about half a centimeter with just some slough on the surface. His right dorsal foot has opened up more and has both slough and eschar present. The ischium has continued to improve and now just has 2 small open areas that are very clean. Patient History Information obtained from Patient. Family History Diabetes - Father, Hypertension - Mother, No family history of Cancer, Heart Disease, Hereditary Spherocytosis, Kidney Disease, Lung Disease, Stroke, Thyroid Problems, Tuberculosis. Social History Former smoker, Marital Status - Married, Alcohol Use - Moderate, Drug Use - No History, Caffeine Use - Daily. Medical History Ear/Nose/Mouth/Throat Denies history of Chronic sinus problems/congestion, Middle ear problems Hematologic/Lymphatic Denies history of Anemia, Hemophilia, Human Immunodeficiency Virus, Lymphedema, Sickle Cell Disease Respiratory Patient has history of Sleep Apnea - does not tolerate cpap Denies history of Aspiration, Asthma, Chronic Obstructive Pulmonary Disease (COPD), Pneumothorax, Tuberculosis Cardiovascular Patient has history of Hypertension - lisinopril HCTZ Denies history of Angina, Arrhythmia, Congestive Heart Failure, Coronary Artery Disease, Deep Vein Thrombosis, Hypotension, Myocardial  Infarction, Peripheral Arterial Disease, Peripheral Venous Disease, Phlebitis, Vasculitis Gastrointestinal Denies history of Cirrhosis , Colitis, Crohnoos, Hepatitis A, Hepatitis B, Hepatitis C Endocrine Denies history of Type I Diabetes, Type II Diabetes Genitourinary Denies  history of End Stage Renal Disease Immunological Denies history of Lupus Erythematosus, Raynaudoos, Scleroderma Integumentary (Skin) Denies history of History of Burn Musculoskeletal Denies history of Gout, Rheumatoid Arthritis, Osteoarthritis, Osteomyelitis Neurologic Patient has history of Paraplegia Denies history of Dementia, Neuropathy, Quadriplegia, Seizure Disorder Oncologic Denies history of Received Chemotherapy, Received Radiation Psychiatric Denies history of Anorexia/bulimia, Confinement Anxiety Hospitalization/Surgery History - cellulitis in leg. - left leg vein ablation. Objective Constitutional Tachycardic, asymptomatic. no acute distress. Vitals Time Taken: 8:37 AM, Height: 70 in, Weight: 216 lbs, BMI: 31, Temperature: 98. F, Pulse: 123 bpm, Respiratory Rate: 18 breaths/min, Blood Pressure: 130/77 mmHg. Hann, ELZIE KNISLEY ([id number]) [phone].pdf Page 32 of 36 Respiratory Normal work of breathing on room air. General Notes: 12/07/2022: The right anterior ankle wound remains open with 2 small areas, again with slough and eschar present. The left lateral ankle wound is down to about half a centimeter with just some slough on the surface. His right dorsal foot has opened up more and has both slough and eschar present. The ischium has continued to improve and now just has 2 small open areas that are very clean. Integumentary (Hair, Skin) Wound #52 status is Open. Original cause of wound was Gradually Appeared. The date acquired was: 03/27/2021. The wound has been in treatment 88 weeks. The wound is located on the Right,Dorsal Foot. The wound measures 0.9cm length x 3.4cm  width x 0.1cm depth; 2.403cm^2 area and 0.24cm^3 volume. There is Fat Layer (Subcutaneous Tissue) exposed. There is no tunneling or undermining noted. There is a medium amount of serosanguineous drainage noted. The wound margin is distinct with the outline attached to the wound base. There is large (67-100%) red granulation within the wound bed. There is a small (1-33%) amount of necrotic tissue within the wound bed including Adherent Slough. The periwound skin appearance had no abnormalities noted for texture. The periwound skin appearance exhibited: Dry/Scaly, Erythema. The surrounding wound skin color is noted with erythema. Erythema is measured at 3 cm. Periwound temperature was noted as No Abnormality. Wound #56 status is Open. Original cause of wound was Gradually Appeared. The date acquired was: 07/11/2021. The wound has been in treatment 72 weeks. The wound is located on the Left,Dorsal Foot. The wound measures 2.5cm length x 0.6cm width x 0.1cm depth; 1.178cm^2 area and 0.118cm^3 volume. There is Fat Layer (Subcutaneous Tissue) exposed. There is no tunneling or undermining noted. There is a small amount of serosanguineous drainage noted. The wound margin is flat and intact. There is large (67-100%) red, pink granulation within the wound bed. There is no necrotic tissue within the wound bed. The periwound skin appearance had no abnormalities noted for texture. The periwound skin appearance exhibited: Dry/Scaly, Erythema. The periwound skin appearance did not exhibit: Maceration. The surrounding wound skin color is noted with erythema. Erythema is measured at 6 cm. Periwound temperature was noted as No Abnormality. Wound #65 status is Open. Original cause of wound was Pressure Injury. The date acquired was: 05/24/2022. The wound has been in treatment 27 weeks. The wound is located on the Left Upper Leg. The wound measures 6cm length x 2cm width x 0.1cm depth; 9.425cm^2 area and 0.942cm^3 volume.  There is Fat Layer (Subcutaneous Tissue) exposed. There is no tunneling or undermining noted. There is a medium amount of serosanguineous drainage noted. The wound margin is flat and intact. There is large (67-100%) red, friable granulation within the wound bed. There is a small (1-33%) amount of necrotic tissue within the wound bed including  Adherent Slough. The periwound skin appearance had no abnormalities noted for texture. The periwound skin appearance had no abnormalities noted for color. The periwound skin appearance exhibited: Dry/Scaly. The periwound skin appearance did not exhibit: Maceration. Periwound temperature was noted as No Abnormality. Wound #66 status is Open. Original cause of wound was Pressure Injury. The date acquired was: 06/15/2022. The wound has been in treatment 25 weeks. The wound is located on the Right,Anterior Ankle. The wound measures 0.7cm length x 0.5cm width x 0.1cm depth; 0.275cm^2 area and 0.027cm^3 volume. There is Fat Layer (Subcutaneous Tissue) exposed. There is no tunneling or undermining noted. There is a medium amount of serosanguineous drainage noted. The wound margin is flat and intact. There is medium (34-66%) red granulation within the wound bed. There is a medium (34-66%) amount of necrotic tissue within the wound bed including Adherent Slough. The periwound skin appearance had no abnormalities noted for texture. The periwound skin appearance exhibited: Dry/Scaly, Erythema. The surrounding wound skin color is noted with erythema. Erythema is measured at 1.5 cm. Periwound temperature was noted as No Abnormality. Wound #67 status is Open. Original cause of wound was Gradually Appeared. The date acquired was: 06/22/2022. The wound has been in treatment 23 weeks. The wound is located on the Left,Lateral Lower Leg. The wound measures 1cm length x 0.6cm width x 0.1cm depth; 0.471cm^2 area and 0.047cm^3 volume. There is Fat Layer (Subcutaneous Tissue) exposed.  There is no tunneling or undermining noted. There is a medium amount of serosanguineous drainage noted. The wound margin is distinct with the outline attached to the wound base. There is large (67-100%) red granulation within the wound bed. There is a small (1-33%) amount of necrotic tissue within the wound bed including Adherent Slough. The periwound skin appearance had no abnormalities noted for texture. The periwound skin appearance exhibited: Dry/Scaly, Erythema. The surrounding wound skin color is noted with erythema with red streaks. Periwound temperature was noted as No Abnormality. 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 There was a Selective/Open Wound Non-Viable Tissue Debridement with a total area of 3.06 sq cm performed by Fredirick Maudlin, MD. With the following instrument(s): Curette to remove Non-Viable tissue/material. Material removed includes Eschar and Slough and. No specimens were taken. A time out was conducted at 09:10, prior to the start of the procedure. A Minimum amount of bleeding was controlled with Pressure. The procedure was tolerated well with a pain level of Insensate throughout and a pain level of Insensate following the procedure. Post Debridement Measurements: 0.9cm length  x 3.4cm width x 0.1cm depth; 0.24cm^3 volume. Character of Wound/Ulcer Post Debridement is improved. Severity of Tissue Post Debridement is: Fat layer exposed. Post procedure Diagnosis Wound #52: Same as Pre-Procedure General Notes: scribed by Baruch Gouty, RN for Dr. Celine Ahr. Wound #66 Pre-procedure diagnosis of Wound #66 is a Venous Leg Ulcer located on the Right,Anterior Ankle .Severity  of Tissue Pre Debridement is: Fat layer exposed. There was a Selective/Open Wound Non-Viable Tissue Debridement with a total area of 0.35 sq cm performed by Fredirick Maudlin, MD. With the following instrument(s): Curette to remove Non-Viable tissue/material. Material removed includes [facility]. No specimens were taken. A time out was conducted at 09:10, prior to the start of the procedure. A Minimum amount of bleeding was controlled with Pressure. The procedure was tolerated well with a pain level of Insensate throughout and a pain level of Insensate following the procedure. Post Debridement Measurements: 0.7cm length x 0.5cm width x 0.1cm depth; 0.027cm^3 Watrous, Jurell E ([id number]) [id number].pdf Page 33 of 36 volume. Character of Wound/Ulcer Post Debridement is improved. Severity of Tissue Post Debridement is: Fat layer exposed. Post procedure Diagnosis Wound #66: Same as Pre-Procedure General Notes: scribed by Baruch Gouty, RN for Dr. Celine Ahr. Wound #67 Pre-procedure diagnosis of Wound #67 is a Venous Leg Ulcer located on the Left,Lateral Lower Leg .Severity of Tissue Pre Debridement is: Fat layer exposed. There was a Selective/Open Wound Skin/Epidermis Debridement with a total area of 0.6 sq cm performed by Fredirick Maudlin, MD. With the following instrument(s): Curette to remove Non-Viable tissue/material. Material removed includes Eschar, Wallace, and Skin: Epidermis. No specimens were taken. A time out was conducted at 09:10, prior to the start of the procedure. A Minimum amount of  bleeding was controlled with Pressure. The procedure was tolerated well with a pain level of Insensate throughout and a pain level of Insensate following the procedure. Post Debridement Measurements: 1cm length x 0.6cm width x 0.1cm depth; 0.047cm^3 volume. Character of Wound/Ulcer Post Debridement is improved. Severity of Tissue Post Debridement is: Fat layer exposed. Post procedure Diagnosis Wound #67: Same as Pre-Procedure General Notes: scribed by Baruch Gouty, RN for Dr. Celine Ahr. Plan Follow-up Appointments: Return Appointment in 2 weeks. - Dr. Celine Ahr RM 1 Tuesday 1/30 @ 0815 am Bathing/ Shower/ Hygiene: May shower and wash wound with soap and water. - on days that dressing is changed Edema Control - Lymphedema / SCD / Other: Moisturize legs daily. - using Aquaphor generously to both legs and feet with dressing changes Compression stocking or Garment 30-40 mm/Hg pressure to: - Juxtalite to both legs daily Off-Loading: Roho cushion for wheelchair Turn and reposition every 2 hours WOUND #52: - Foot Wound Laterality: Dorsal, Right Cleanser: Soap and Water Every Other Day/30 Days Discharge Instructions: May shower and wash wound with dial antibacterial soap and water prior to dressing change. Cleanser: Wound Cleanser Every Other Day/30 Days Discharge Instructions: Cleanse the wound with wound cleanser prior to applying a clean dressing using gauze sponges, not tissue or cotton balls. Peri-Wound Care: Sween Lotion (Moisturizing lotion) Every Other Day/30 Days Discharge Instructions: Apply Aquaphor moisturizing lotion as directed Prim Dressing: [place] 2x2 (in/in) Every Other Day/30 Days ary Discharge Instructions: Apply to wound bed as instructed Secondary Dressing: Woven Gauze Sponge, Non-Sterile 4x4 in Every Other Day/30 Days Discharge Instructions: Apply over primary dressing as directed. Secured With: [employer], 4.5x3.1 (in/yd) (Generic) Every Other Day/30  Days Discharge Instructions: Secure with Kerlix as directed. Secured With: 40M Medipore H Soft Cloth Surgical T ape, 4 x 10 (in/yd) (Generic) Every Other Day/30 Days Discharge Instructions: Secure with tape as directed. Com pression Stockings: Circaid Juxta Lite Compression Wrap Compression Amount: 30-40 mmHg (right) Discharge Instructions: Apply Circaid Juxta Lite Compression Wrap daily as instructed. Apply first thing in the morning, remove at night before bed. WOUND #56: - Foot Wound Laterality: Dorsal, Left  Cleanser: Soap and Water Every Other Day/30 Days Discharge Instructions: May shower and wash wound with dial antibacterial soap and water prior to dressing change. Cleanser: Wound Cleanser Every Other Day/30 Days Discharge Instructions: Cleanse the wound with wound cleanser prior to applying a clean dressing using gauze sponges, not tissue or cotton balls. Peri-Wound Care: Sween Lotion (Moisturizing lotion) Every Other Day/30 Days Discharge Instructions: Apply Aquaphor moisturizing lotion as directed Prim Dressing: [place] 2x2 (in/in) Every Other Day/30 Days ary Discharge Instructions: Apply to wound bed as instructed Secondary Dressing: Woven Gauze Sponge, Non-Sterile 4x4 in Every Other Day/30 Days Discharge Instructions: Apply over primary dressing as directed. Secured With: [employer], 4.5x3.1 (in/yd) (Generic) Every Other Day/30 Days Discharge Instructions: Secure with Kerlix as directed. Secured With: 2M Medipore H Soft Cloth Surgical T ape, 4 x 10 (in/yd) (Generic) Every Other Day/30 Days Discharge Instructions: Secure with tape as directed. WOUND #65: - Upper Leg Wound Laterality: Left Cleanser: Soap and Water Every Other Day/30 Days Discharge Instructions: May shower and wash wound with dial antibacterial soap and water prior to dressing change. Cleanser: Wound Cleanser Every Other Day/30 Days Discharge Instructions: Cleanse the wound with wound  cleanser prior to applying a clean dressing using gauze sponges, not tissue or cotton balls. Peri-Wound Care: Zinc Oxide Ointment 30g tube Every Other Day/30 Days Discharge Instructions: Apply Zinc Oxide to periwound with each dressing change Topical: [place] antibiotic compound Every Other Day/30 Days Discharge Instructions: thin layer to wound bed, use daily once available Prim Dressing: Cutimed Sorbact Swab Every Other Day/30 Days ary Discharge Instructions: Apply to wound bed as instructed Secondary Dressing: Zetuvit Plus Silicone Border Dressing 7x7(in/in) Every Other Day/30 Days Discharge Instructions: Apply silicone border over primary dressing as directed. WOUND #66: - Ankle Wound Laterality: Right, Anterior Cleanser: Soap and Water Every Other Day/30 Days Discharge Instructions: May shower and wash wound with dial antibacterial soap and water prior to dressing change. Cleanser: Wound Cleanser Every Other Day/30 Days Discharge Instructions: Cleanse the wound with wound cleanser prior to applying a clean dressing using gauze sponges, not tissue or cotton balls. Peri-Wound Care: Sween Lotion (Moisturizing lotion) Every Other Day/30 Days Discharge Instructions: Apply Aquaphor moisturizing lotion as directed Prim Dressing: [place] 2x2 (in/in) Every Other Day/30 Days ary Discharge Instructions: Apply to wound bed as instructed Secured With: Kerlix Roll Sterile, 4.5x3.1 (in/yd) (Generic) Every Other Day/30 Days Discharge Instructions: Secure with Kerlix as directed. Secured With: 2M Medipore H Soft Cloth Surgical T ape, 4 x 10 (in/yd) (Generic) Every Other Day/30 Days Persad, Bradyn E ([id number]) [phone].pdf Page 34 of 36 Discharge Instructions: Secure with tape as directed. WOUND #67: - Lower Leg Wound Laterality: Left, Lateral Cleanser: Soap and Water Every Other Day/30 Days Discharge Instructions: May shower and wash wound with dial  antibacterial soap and water prior to dressing change. Cleanser: Wound Cleanser Every Other Day/30 Days Discharge Instructions: Cleanse the wound with wound cleanser prior to applying a clean dressing using gauze sponges, not tissue or cotton balls. Peri-Wound Care: Triamcinolone 15 (g) Every Other Day/30 Days Discharge Instructions: Use triamcinolone 15 (g) as directed Peri-Wound Care: Sween Lotion (Moisturizing lotion) Every Other Day/30 Days Discharge Instructions: Apply Aquaphor moisturizing lotion as directed Prim Dressing: [place] 2x2 (in/in) Every Other Day/30 Days ary Discharge Instructions: Apply to wound bed as instructed Secondary Dressing: Woven Gauze Sponge, Non-Sterile 4x4 in Every Other Day/30 Days Discharge Instructions: Apply over primary dressing as directed. Secured With: [employer], 4.5x3.1 (  in/yd) (Generic) Every Other Day/30 Days Discharge Instructions: Secure with Kerlix as directed. Secured With: 40M Medipore H Soft Cloth Surgical T ape, 4 x 10 (in/yd) (Generic) Every Other Day/30 Days Discharge Instructions: Secure with tape as directed. 12/07/2022: The right anterior ankle wound remains open with 2 small areas, again with slough and eschar present. The left lateral ankle wound is down to about half a centimeter with just some slough on the surface. His right dorsal foot has opened up more and has both slough and eschar present. The ischium has continued to improve and now just has 2 small open areas that are very clean. I used a curette to debride slough and eschar off the right anterior ankle wound and the right dorsal foot wound. I debrided slough off of the left lateral ankle wound. The gluteal wound did not require debridement. We will continue [place] topical antimicrobial compound and Sorbact on the gluteus. I think we can discontinue the mupirocin on the lateral ankle wound. Continue silver alginate to all other sites. He will follow-up in 2  weeks. Electronic Signature(s) Signed: 12/07/2022 9:36:49 AM By: Fredirick Maudlin MD FACS Entered By: Fredirick Maudlin on 12/07/2022 09:36:49 -------------------------------------------------------------------------------- HxROS Details Patient Name: Date of Service: [place], [name] 12/07/2022 8:30 A M Medical Record Number: [id number] Patient Account Number: [id number] Date of Birth/Sex: Treating RN: [birth year] (37 y.o. M) Primary Care Provider: [place], [place] Other Clinician: Referring Provider: Treating Provider/Extender: Cornelia Copa [name]: 361 Information Obtained From Patient Ear/Nose/Mouth/Throat Medical History: Negative for: Chronic sinus problems/congestion; Middle ear problems Hematologic/Lymphatic Medical History: Negative for: Anemia; Hemophilia; Human Immunodeficiency Virus; Lymphedema; Sickle Cell Disease Respiratory Medical History: Positive for: Sleep Apnea - does not tolerate cpap Negative for: Aspiration; Asthma; Chronic Obstructive Pulmonary Disease (COPD); Pneumothorax; Tuberculosis Cardiovascular Medical History: Positive for: Hypertension - lisinopril HCTZ Negative for: Angina; Arrhythmia; Congestive Heart Failure; Coronary Artery Disease; Deep Vein Thrombosis; Hypotension; Myocardial Infarction; Peripheral Arterial Disease; Peripheral Venous Disease; Phlebitis; Vasculitis Gastrointestinal Urick, [name] E ([id number]) [id number] Page 35 of 36 Medical History: Negative for: Cirrhosis ; Colitis; Crohns; Hepatitis A; Hepatitis B; Hepatitis C Endocrine Medical History: Negative for: Type I Diabetes; Type II Diabetes Genitourinary Medical History: Negative for: End Stage Renal Disease Immunological Medical History: Negative for: Lupus Erythematosus; Raynauds; Scleroderma Integumentary (Skin) Medical History: Negative for: History of Burn Musculoskeletal Medical History: Negative for: Gout;  Rheumatoid Arthritis; Osteoarthritis; Osteomyelitis Neurologic Medical History: Positive for: Paraplegia Negative for: Dementia; Neuropathy; Quadriplegia; Seizure Disorder Oncologic Medical History: Negative for: Received Chemotherapy; Received Radiation Psychiatric Medical History: Negative for: Anorexia/bulimia; Confinement Anxiety Immunizations Pneumococcal Vaccine: Received Pneumococcal Vaccination: No Immunization Notes: doesn't remember when last tetanus shot was Implantable Devices No devices added Hospitalization / Surgery History Type of Hospitalization/Surgery cellulitis in leg left leg vein ablation Family and Social History Cancer: No; Diabetes: Yes - Father; Heart Disease: No; Hereditary Spherocytosis: No; Hypertension: Yes - Mother; Kidney Disease: No; Lung Disease: No; Stroke: No; Thyroid Problems: No; Tuberculosis: No; Former smoker; Marital Status - Married; Alcohol Use: Moderate; Drug Use: No History; Caffeine Use: Daily; Financial Concerns: No; Food, Clothing or Shelter Needs: No; Support System Lacking: No; Transportation Concerns: No Electronic Signature(s) Signed: 12/07/2022 11:02:56 AM By: Fredirick Maudlin MD FACS Entered By: Fredirick Maudlin on 12/07/2022 09:35:22 Fralix, [name] ([id number]) [id number] Page 36 of 36 -------------------------------------------------------------------------------- SuperBill Details Patient Name: Date of Service: [place]ippens, [name] 12/07/2022 Medical Record Number: [id number] Patient Account Number: [id number] Date of Birth/Sex: Treating RN: [birth year] (37 y.o. 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 with other specified sever 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 Modifier: ity Quantity: 1 Physician Procedures : CPT4 Code Description Modifier [id number] - WC PHYS LEVEL 4 - EST PT 25 ICD-10 Diagnosis Description 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, 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 with other specified severity L97.312  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

## 2022-12-21 ENCOUNTER — Encounter (HOSPITAL_BASED_OUTPATIENT_CLINIC_OR_DEPARTMENT_OTHER): Payer: BC Managed Care – PPO | Admitting: General Surgery

## 2022-12-21 DIAGNOSIS — L97822 Non-pressure chronic ulcer of other part of left lower leg with fat layer exposed: Secondary | ICD-10-CM | POA: Diagnosis not present

## 2022-12-21 DIAGNOSIS — L89323 Pressure ulcer of left buttock, stage 3: Secondary | ICD-10-CM | POA: Diagnosis not present

## 2022-12-21 DIAGNOSIS — L97521 Non-pressure chronic ulcer of other part of left foot limited to breakdown of skin: Secondary | ICD-10-CM | POA: Diagnosis not present

## 2022-12-21 DIAGNOSIS — I87333 Chronic venous hypertension (idiopathic) with ulcer and inflammation of bilateral lower extremity: Secondary | ICD-10-CM | POA: Diagnosis not present

## 2022-12-21 DIAGNOSIS — G8220 Paraplegia, unspecified: Secondary | ICD-10-CM | POA: Diagnosis not present

## 2022-12-21 DIAGNOSIS — I87332 Chronic venous hypertension (idiopathic) with ulcer and inflammation of left lower extremity: Secondary | ICD-10-CM | POA: Diagnosis not present

## 2022-12-21 DIAGNOSIS — L97511 Non-pressure chronic ulcer of other part of right foot limited to breakdown of skin: Secondary | ICD-10-CM | POA: Diagnosis not present

## 2022-12-21 DIAGNOSIS — L89313 Pressure ulcer of right buttock, stage 3: Secondary | ICD-10-CM | POA: Diagnosis not present

## 2022-12-21 DIAGNOSIS — L97312 Non-pressure chronic ulcer of right 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: Secondary | ICD-10-CM | POA: Diagnosis not present

## 2022-12-21 DIAGNOSIS — L97812 Non-pressure chronic ulcer of other part of right lower leg with fat layer exposed: Secondary | ICD-10-CM | POA: Diagnosis not present

## 2022-12-21 DIAGNOSIS — L97322 Non-pressure chronic ulcer of left 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: Secondary | ICD-10-CM | POA: Diagnosis not present

## 2022-12-21 DIAGNOSIS — L98491 Non-pressure chronic ulcer of skin of other sites limited to breakdown of skin: Secondary | ICD-10-CM | POA: Diagnosis not present

## 2022-12-21 DIAGNOSIS — L97512 Non-pressure chronic ulcer of other part of right foot with fat layer exposed: Secondary | ICD-10-CM | POA: Diagnosis not present

## 2022-12-21 NOTE — Progress Notes (Signed)
Fodor, Makyle E ([id number]) [phone].pdf Page 1 of 21 Visit Report for 12/21/2022 Arrival Information Details Patient Name: Date of Service: Kibler, [name] 12/21/2022 8:15 A M Medical Record Number: [id number] Patient Account Number: [id number] Date of Birth/Sex: Treating RN: 19-Aug-1986 (37 y.o. [name] Primary Care Lunna Vogelgesang: [place], [place] Other Clinician: Referring Guila Owensby: Treating Geselle Cardosa/Extender: Cornelia Copa [name]: 89 Visit Information History Since Last Visit Added or deleted any medications: No Patient Arrived: Wheel Chair Any new allergies or adverse reactions: No Arrival Time: 08:20 Had a fall or experienced change in No Accompanied By: self activities of daily living that may affect Transfer Assistance: None risk of falls: Patient Identification Verified: Yes Signs or symptoms of abuse/neglect since last visito No Secondary Verification Process Completed: Yes Hospitalized since last visit: No Patient Requires Transmission-Based Precautions: No Implantable device outside of the clinic excluding No Patient Has Alerts: Yes cellular tissue based products placed in the center Patient Alerts: R ABI = 1.0 since last visit: L ABI = 1.1 Has Dressing in Place as Prescribed: Yes Has Compression in Place as Prescribed: Yes Pain Present Now: No Electronic Signature(s) Signed: 12/21/2022 5:44:44 PM By: Baruch Gouty RN, BSN Entered By: Baruch Gouty on 12/21/2022 08:28:39 -------------------------------------------------------------------------------- Encounter Discharge Information Details Patient Name: Date of Service: [place], [name] 12/21/2022 8:15 A M Medical Record Number: [id number] Patient Account Number: [id number] Date of Birth/Sex: Treating RN: September 06, 1986 (37 y.o. [name] Primary Care Justun Anaya: [place], [place] Other Clinician: Referring Rakia Frayne: Treating Majour Frei/Extender: Cornelia Copa [name]: [id number] Encounter Discharge Information Items Post Procedure Vitals Discharge Condition: Stable Temperature (F): 98.5 Ambulatory Status: Wheelchair Pulse (bpm): 125 Discharge Destination: Home Respiratory Rate (breaths/min): 18 Transportation: Private Auto Blood Pressure (mmHg): 116/63 Accompanied By: self Schedule Follow-up Appointment: Yes Clinical Summary of Care: Patient Declined Electronic Signature(s) Signed: 12/21/2022 5:44:44 PM By: Baruch Gouty RN, BSN Entered By: Baruch Gouty on 12/21/2022 09:39:40 Knezevic, [name] ([id number]) [id number].pdf Page 2 of 21 -------------------------------------------------------------------------------- Lower Extremity Assessment Details Patient Name: Date of Service: [place], [name] 12/21/2022 8:15 A M Medical Record Number: [id number] Patient Account Number: [id number] Date of Birth/Sex: Treating RN: Dec 11, 1985 (37 y.o. [name] Primary Care Rosanna Bickle: [place], [place] Other Clinician: Referring Karlissa Aron: Treating Johni Narine/Extender: Graciela Husbands, [name]: 363 Edema Assessment Assessed: [Left: No] [Right: No] Edema: [Left: Yes] [Right: Yes] Calf Left: Right: Point of Measurement: 33 cm From Medial Instep 32 cm 28 cm Ankle Left: Right: Point of Measurement: 10 cm From Medial Instep 24.3 cm 24.5 cm Vascular Assessment Pulses: Dorsalis Pedis Palpable: [Left:Yes] [Right:Yes] Electronic Signature(s) Signed: 12/21/2022 5:44:44 PM By: Baruch Gouty RN, BSN Entered By: Baruch Gouty on 12/21/2022 08:38:09 -------------------------------------------------------------------------------- Multi Wound Chart Details Patient Name: Date of Service: [place], [name] 12/21/2022 8:15 A M Medical Record Number: [id number] Patient Account Number: [id number] Date of Birth/Sex: Treating RN: [birth year] (37 y.o. 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 Primary Etiology: Sleep Apnea, Hypertension, Paraplegia Sleep Apnea, Hypertension, Paraplegia Sleep Apnea, Hypertension, Paraplegia Comorbid History: 03/27/2021 07/11/2021 05/24/2022 Date Acquired: 90 74 29  Weeks of Treatment: Open Open Open Wound Status: No No No Wound Recurrence: No Yes No Clustered Wound: 1.8x3.7x0.1 0.3x0.3x0.1 0.7x1x0.1 Measurements L x W x D (cm) 5.231 0.071 0.55 A (cm) : rea 0.523 0.007 0.055 Volume (cm) : -177.50% 98.90% 91.20% % Reduction in A rea: -178.20% 98.90% 91.20% % Reduction in Volume: Full Thickness Without Exposed Full Thickness Without Exposed Category/Stage III Classification: Support Structures Support Structures Medium Medium Medium Exudate A mount: Serosanguineous Serous Serosanguineous Exudate Type: red, brown amber red, brown Exudate Color: Distinct, outline attached Flat and Intact Flat and Intact Wound Margin: Small (1-33%) Large (67-100%) Large (67-100%) Granulation A mount: Red Red, Pink Red Granulation Quality: Large (67-100%) None Present (0%) None Present (0%) Necrotic A mount: Fat Layer (Subcutaneous Tissue): Yes Fat Layer (Subcutaneous Tissue): Yes Fat Layer (Subcutaneous Tissue): Yes Exposed Structures: Fascia: No Fascia: No Fascia: No Tendon: No Tendon: No Tendon: No Muscle: No Muscle: No Muscle: No Joint: No Joint: No Joint: No Bone:  No Bone: No Bone: No Small (1-33%) Large (67-100%) Large (67-100%) Epithelialization: Debridement - Selective/Open Wound N/A N/A Debridement: Pre-procedure Verification/Time Out 09:15 N/A N/A Taken: Slough N/A N/A Tissue Debrided: Non-Viable Tissue N/A N/A Level: 6.66 N/A N/A Debridement A (sq cm): rea Curette N/A N/A Instrument: Minimum N/A N/A Bleeding: Pressure N/A N/A Hemostasis A chieved: Insensate N/A N/A Procedural Pain: Insensate N/A N/A Post Procedural Pain: Procedure was tolerated well N/A N/A Debridement Treatment Response: 1.8x3.7x0.1 N/A N/A Post Debridement Measurements L x W x D (cm) 0.523 N/A N/A Post Debridement Volume: (cm) N/A N/A N/A Post Debridement Stage: No Abnormalities Noted No Abnormalities Noted Scarring: Yes Periwound Skin Texture: Excoriation: No Dry/Scaly: Yes Dry/Scaly: Yes Dry/Scaly: Yes Periwound Skin Moisture: Maceration: No Maceration: No Erythema: Yes Erythema: Yes No Abnormalities Noted Periwound Skin Color: N/A N/A N/A Erythema Location: Measured: 3cm Measured: 6cm N/A Erythema Measurement: N/A No Change N/A Erythema Change: No Abnormality No Abnormality No Abnormality Temperature: Debridement N/A N/A Procedures Performed: Wound Number: [id number] Photos: Right, Anterior Ankle Left, Lateral Lower Leg Right, Lateral Lower Leg Wound Location: Pressure Injury Gradually Appeared Gradually Appeared Wounding Event: Venous 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 Primary Etiology: Sleep Apnea, Hypertension, Paraplegia Sleep Apnea, Hypertension, Paraplegia Sleep Apnea, Hypertension, Paraplegia Comorbid History: 06/15/2022 06/22/2022 12/06/2022 Date Acquired: 27 25 0 Weeks of Treatment: Open Open Open Wound Status: No No No Wound Recurrence: No No No Clustered Wound: 0.1x0.1x0.1 1.4x0.8x0.1 0.6x0.7x0.1 Measurements L x W x D (cm) 0.008 0.88 0.33 A (cm) : rea 0.001 0.088 0.033 Volume (cm) : 99.40% 84.30%  N/A % Reduction in A rea: 99.20% 84.30% N/A % Reduction in Volume: Full Thickness Without Exposed Full Thickness Without Exposed Full Thickness Without Exposed Classification: Support Structures Support Structures Support Structures Small Medium Medium Exudate Amount: Lafata, [name] ([id number]) [id number] Page 4 of 21 Serosanguineous Serosanguineous Serosanguineous Exudate Type: red, brown red, brown red, brown Exudate Color: Flat and Intact Distinct, outline attached Flat and Intact Wound Margin: None Present (0%) Large (67-100%) Small (1-33%) Granulation Amount: N/A Red Red Granulation Quality: None Present (0%) Small (1-33%) Large (67-100%) Necrotic Amount: Fat Layer (Subcutaneous Tissue): Yes Fat Layer (Subcutaneous Tissue): Yes Fat Layer (Subcutaneous Tissue): Yes Exposed Structures: Fascia: No Fascia: No Fascia: No Tendon: No Tendon: No Tendon: No Muscle: No Muscle: No Muscle: No Joint: No Joint: No Joint: No Bone: No Bone: No Bone: No Large (67-100%) Medium (34-66%) Small (1-33%) Epithelialization: N/A Debridement - Selective/Open Wound Debridement - Selective/Open Wound Debridement: Pre-procedure  Verification/Time Out N/A 09:15 09:15 Taken: N/A [facility] Tissue Debrided: N/A Non-Viable Tissue Non-Viable Tissue Level: N/A 1.12 0.42 Debridement A (sq cm): rea N/A Curette Curette Instrument: N/A Minimum Minimum Bleeding: N/A Pressure Pressure Hemostasis A chieved: N/A Insensate Insensate Procedural Pain: N/A Insensate Insensate Post Procedural Pain: N/A Procedure was tolerated well Procedure was tolerated well Debridement Treatment Response: N/A 1.4x0.8x0.1 0.6x0.7x0.1 Post Debridement Measurements L x W x D (cm) N/A 0.088 0.033 Post Debridement Volume: (cm) N/A N/A N/A Post Debridement Stage: No Abnormalities Noted No Abnormalities Noted No Abnormalities Noted Periwound Skin Texture: Dry/Scaly:  Yes Dry/Scaly: Yes No Abnormalities Noted Periwound Skin Moisture: Erythema: No Erythema: Yes No Abnormalities Noted Periwound Skin Color: N/A Red Streaks N/A Erythema Location: N/A N/A N/A Erythema Measurement: N/A No Change N/A Erythema Change: No Abnormality No Abnormality No Abnormality Temperature: N/A Debridement Debridement Procedures Performed: Wound Number: 72 N/A N/A Photos: N/A N/A Right Gluteus N/A N/A Wound Location: Pressure Injury N/A N/A Wounding Event: Pressure Ulcer N/A N/A Primary Etiology: Sleep Apnea, Hypertension, Paraplegia N/A N/A Comorbid History: 12/13/2022 N/A N/A Date Acquired: 0 N/A N/A Weeks of Treatment: Open N/A N/A Wound Status: No N/A N/A Wound Recurrence: No N/A N/A Clustered Wound: 3x1.7x0.1 N/A N/A Measurements L x W x D (cm) 4.006 N/A N/A A (cm) : rea 0.401 N/A N/A Volume (cm) : N/A N/A N/A % Reduction in A rea: N/A N/A N/A % Reduction in Volume: Category/Stage III N/A N/A Classification: Medium N/A N/A Exudate A mount: Serosanguineous N/A N/A Exudate Type: red, brown N/A N/A Exudate Color: Flat and Intact N/A N/A Wound Margin: Medium (34-66%) N/A N/A Granulation A mount: Red N/A N/A Granulation Quality: Medium (34-66%) N/A N/A Necrotic A mount: Fat Layer (Subcutaneous Tissue): Yes N/A N/A Exposed Structures: Fascia: No Tendon: No Muscle: No Joint: No Bone: No Small (1-33%) N/A N/A Epithelialization: Debridement - Selective/Open Wound N/A N/A Debridement: Pre-procedure Verification/Time Out 09:15 N/A N/A Taken: Slough N/A N/A Tissue Debrided: Non-Viable Tissue N/A N/A Level: 5.1 N/A N/A Debridement A (sq cm): rea Curette N/A N/A Instrument: Belcher, Bishop E ([id number]) [id number] Page 5 of 21 Minimum N/A N/A Bleeding: Pressure N/A N/A Hemostasis A chieved: Insensate N/A N/A Procedural Pain: Insensate N/A N/A Post Procedural Pain: Procedure was tolerated well N/A  N/A Debridement Treatment Response: 3x1.7x0.1 N/A N/A Post Debridement Measurements L x W x D (cm) 0.401 N/A N/A Post Debridement Volume: (cm) Category/Stage III N/A N/A Post Debridement Stage: No Abnormalities Noted N/A N/A Periwound Skin Texture: No Abnormalities Noted N/A N/A Periwound Skin Moisture: No Abnormalities Noted N/A N/A Periwound Skin Color: N/A N/A N/A Erythema Location: N/A N/A N/A Erythema Measurement: N/A N/A N/A Erythema Change: N/A N/A N/A Temperature: Debridement N/A N/A Procedures Performed: Treatment Notes Wound #52 (Foot) Wound Laterality: Dorsal, Right Cleanser Soap and Water Discharge Instruction: May shower and wash wound with dial antibacterial soap and water prior to dressing change. Wound Cleanser Discharge Instruction: Cleanse the wound with wound cleanser prior to applying a clean dressing using gauze sponges, not tissue or cotton balls. Peri-Wound Care Sween Lotion (Moisturizing lotion) Discharge Instruction: Apply Aquaphor moisturizing lotion as directed Topical Primary Dressing Sorbalgon AG Dressing 2x2 (in/in) Discharge Instruction: Apply to wound bed as instructed Secondary Dressing Woven Gauze Sponge, Non-Sterile 4x4 in Discharge Instruction: Apply over primary dressing as directed. Secured With [employer], 4.5x3.1 (in/yd) Discharge Instruction: Secure with Kerlix as directed. 94M Medipore H Soft Cloth Surgical T ape, 4 x 10 (in/yd) Discharge Instruction: Secure with tape  as directed. Compression Wrap Compression Stockings Circaid Juxta Lite Compression Wrap Quantity: 1 Right Leg Compression Amount: 30-40 mmHg Discharge Instruction: Apply Circaid Juxta Lite Compression Wrap daily as instructed. Apply first thing in the morning, remove at night before bed. Add-Ons Wound #56 (Foot) Wound Laterality: Dorsal, Left Cleanser Soap and Water Discharge Instruction: May shower and wash wound with dial antibacterial soap  and water prior to dressing change. Wound Cleanser Discharge Instruction: Cleanse the wound with wound cleanser prior to applying a clean dressing using gauze sponges, not tissue or cotton balls. Peri-Wound Care Sween Lotion (Moisturizing lotion) Discharge Instruction: Apply Aquaphor moisturizing lotion as directed Topical Primary Dressing Sorbalgon AG Dressing 2x2 (in/in) Discharge Instruction: Apply to wound bed as instructed Sabol, Manoah E ([id number]) [id number] Page 6 of 21 Secondary Dressing Woven Gauze Sponge, Non-Sterile 4x4 in Discharge Instruction: Apply over primary dressing as directed. Secured With [employer], 4.5x3.1 (in/yd) Discharge Instruction: Secure with Kerlix as directed. 50M Medipore H Soft Cloth Surgical T ape, 4 x 10 (in/yd) Discharge Instruction: Secure with tape as directed. Compression Wrap Compression Stockings Add-Ons Wound #65 (Upper Leg) Wound Laterality: Left Cleanser Soap and Water Discharge Instruction: May shower and wash wound with dial antibacterial soap and water prior to dressing change. Wound Cleanser Discharge Instruction: Cleanse the wound with wound cleanser prior to applying a clean dressing using gauze sponges, not tissue or cotton balls. Peri-Wound Care Zinc Oxide Ointment 30g tube Discharge Instruction: Apply Zinc Oxide to periwound with each dressing change Topical Keystone antibiotic compound Discharge Instruction: thin layer to wound bed, use daily once available Primary Dressing Cutimed Sorbact Swab Discharge Instruction: Apply to wound bed as instructed Secondary Dressing Zetuvit Plus Silicone Border Dressing 7x7(in/in) Discharge Instruction: Apply silicone border over primary dressing as directed. Secured With Compression Wrap Compression Stockings Add-Ons Wound #66 (Ankle) Wound Laterality: Right, Anterior Cleanser Soap and Water Discharge Instruction: May shower and wash wound with  dial antibacterial soap and water prior to dressing change. Wound Cleanser Discharge Instruction: Cleanse the wound with wound cleanser prior to applying a clean dressing using gauze sponges, not tissue or cotton balls. Peri-Wound Care Sween Lotion (Moisturizing lotion) Discharge Instruction: Apply Aquaphor moisturizing lotion as directed Topical Primary Dressing Sorbalgon AG Dressing 2x2 (in/in) Discharge Instruction: Apply to wound bed as instructed Secondary Dressing Secured With Kerlix Roll Sterile, 4.5x3.1 (in/yd) Discharge Instruction: Secure with Kerlix as directed. 50M Medipore H Soft Cloth Surgical T ape, 4 x 10 (in/yd) Discharge Instruction: Secure with tape as directed. Happ, Raciel E ([id number]) [phone].pdf Page 7 of 21 Compression Wrap Compression Stockings Add-Ons Wound #67 (Lower Leg) Wound Laterality: Left, Lateral Cleanser Soap and Water Discharge Instruction: May shower and wash wound with dial antibacterial soap and water prior to dressing change. Wound Cleanser Discharge Instruction: Cleanse the wound with wound cleanser prior to applying a clean dressing using gauze sponges, not tissue or cotton balls. Peri-Wound Care Triamcinolone 15 (g) Discharge Instruction: Use triamcinolone 15 (g) as directed Sween Lotion (Moisturizing lotion) Discharge Instruction: Apply Aquaphor moisturizing lotion as directed Topical Primary Dressing Sorbalgon AG Dressing 2x2 (in/in) Discharge Instruction: Apply to wound bed as instructed Secondary Dressing Woven Gauze Sponge, Non-Sterile 4x4 in Discharge Instruction: Apply over primary dressing as directed. Secured With [employer], 4.5x3.1 (in/yd) Discharge Instruction: Secure with Kerlix as directed. 50M Medipore H Soft Cloth Surgical T ape, 4 x 10 (in/yd) Discharge Instruction: Secure with tape as directed. Compression Wrap Compression Stockings Add-Ons Wound #71 (Lower Leg) Wound  Laterality: Right, Lateral  Cleanser Soap and Water Discharge Instruction: May shower and wash wound with dial antibacterial soap and water prior to dressing change. Wound Cleanser Discharge Instruction: Cleanse the wound with wound cleanser prior to applying a clean dressing using gauze sponges, not tissue or cotton balls. Peri-Wound Care Sween Lotion (Moisturizing lotion) Discharge Instruction: Apply Aquaphor moisturizing lotion as directed Topical Primary Dressing Sorbalgon AG Dressing 2x2 (in/in) Discharge Instruction: Apply to wound bed as instructed Secondary Dressing Woven Gauze Sponge, Non-Sterile 4x4 in Discharge Instruction: Apply over primary dressing as directed. Secured With [employer], 4.5x3.1 (in/yd) Discharge Instruction: Secure with Kerlix as directed. 5M Medipore H Soft Cloth Surgical T ape, 4 x 10 (in/yd) Discharge Instruction: Secure with tape as directed. Compression Wrap Compression Stockings [name], Guadalupe E ([id number]) [phone].pdf Page 8 of 21 Add-Ons Wound #72 (Gluteus) Wound Laterality: Right Cleanser Peri-Wound Care Topical Primary Dressing Sorbalgon AG Dressing 2x2 (in/in) Discharge Instruction: Apply to wound bed as instructed Secondary Dressing Zetuvit Plus Silicone Border Dressing 4x4 (in/in) Discharge Instruction: Apply silicone border over primary dressing as directed. Secured With Compression Wrap Compression Stockings Environmental education officer) Signed: 12/21/2022 9:44:31 AM By: Fredirick Maudlin MD FACS Entered By: Fredirick Maudlin on 12/21/2022 09:44:31 -------------------------------------------------------------------------------- Multi-Disciplinary Care Plan Details Patient Name: Date of Service: Moffatt, [name] 12/21/2022 8:15 A M Medical Record Number: [id number] Patient Account Number: [id number] Date of Birth/Sex: Treating RN: [birth year] (37 y.o. [name] Primary Care Jiah Bari: [name],  [name] Other Clinician: Referring Tina Temme: Treating Swayze Pries/Extender: Cornelia Copa [name]: [place] reviewed with physician Active Inactive Pressure Nursing Diagnoses: Knowledge deficit related to management of pressures ulcers Potential for impaired tissue integrity related to pressure, friction, moisture, and shear Goals: Patient will remain free from development of additional pressure ulcers Date Initiated: 06/21/2018 Date Inactivated: 02/12/2019 Target Resolution Date: 02/16/2019 Unmet Reason: pressure ulcer Goal Status: Unmet reopened on left ischium Patient/caregiver will verbalize risk factors for pressure ulcer development Date Initiated: 06/21/2018 Date Inactivated: 05/28/2019 Target Resolution Date: 06/01/2019 Goal Status: Met Patient/caregiver will verbalize understanding of pressure ulcer management Date Initiated: 06/01/2022 Target Resolution Date: 01/18/2023 Goal Status: Active Interventions: Assess: immobility, friction, shearing, incontinence upon admission and as needed Assess offloading mechanisms upon admission and as needed Assess potential for pressure ulcer upon admission and as needed Boyland, Rylend E ([id number]) [id number] Page 9 of 21 Treatment Activities: Pressure reduction/relief device ordered : 12/05/2017 Notes: Wound/Skin Impairment Nursing Diagnoses: Impaired tissue integrity Knowledge deficit related to ulceration/compromised skin integrity Goals: Patient/caregiver will verbalize understanding of skin care regimen Date Initiated: 06/01/2022 Target Resolution Date: 01/18/2023 Goal Status: Active Ulcer/skin breakdown will have a volume reduction of 30% by week 4 Date Initiated: 06/01/2022 Date Inactivated: 06/29/2022 Target Resolution Date: 06/29/2022 Unmet Reason: complex wounds, Goal Status: Unmet infection Interventions: Assess patient/caregiver ability to obtain necessary  supplies Assess ulceration(s) every visit Provide education on ulcer and skin care Notes: 02/02/21: Complex Care, ongoing. Electronic Signature(s) Signed: 12/21/2022 5:44:44 PM By: Baruch Gouty RN, BSN Entered By: Baruch Gouty on 12/21/2022 09:08:35 -------------------------------------------------------------------------------- Pain Assessment Details Patient Name: Date of Service: Jemmott, [name] 12/21/2022 8:15 A M Medical Record Number: [id number] Patient Account Number: [id number] Date of Birth/Sex: Treating RN: 13-Sep-1986 (37 y.o. [name] Primary Care Temia Debroux: [place], [place] Other Clinician: Referring Tarika Mckethan: Treating Gregor Dershem/Extender: Cornelia Copa [name]: [phone] Active Problems Location of Pain Severity and Description of Pain Patient Has Paino No Site Locations Rate the pain. Current Pain Level: 0 Pain Management  and Medication Current Pain Management: Gianfrancesco, Kinan E ([id number]) [id number] Page 10 of 21 Electronic Signature(s) Signed: 12/21/2022 5:44:44 PM By: Baruch Gouty RN, BSN Entered By: Baruch Gouty on 12/21/2022 08:29:21 -------------------------------------------------------------------------------- Patient/Caregiver Education Details Patient Name: Date of Service: Shirk, [name] 1/30/2024andnbsp8:15 A M Medical Record Number: [id number] Patient Account Number: [id number] Date of Birth/Gender: Treating RN: 11/28/1985 (37 y.o. [name] Primary Care Physician: Janine Limbo Other Clinician: Referring Physician: Treating Physician/Extender: Cornelia Copa [name]: 75 Education Assessment Education Provided To: Patient Education Topics Provided Pressure: Methods: Explain/Verbal Responses: Reinforcements needed, State content correctly Wound/Skin Impairment: Methods: Explain/Verbal Responses: Reinforcements needed, State content  correctly Electronic Signature(s) Signed: 12/21/2022 5:44:44 PM By: Baruch Gouty RN, BSN Entered By: Baruch Gouty on 12/21/2022 09:09:21 -------------------------------------------------------------------------------- Wound Assessment Details Patient Name: Date of Service: Borel, [name] 12/21/2022 8:15 A M Medical Record Number: [id number] Patient Account Number: [id number] Date of Birth/Sex: Treating RN: 11-16-1986 (37 y.o. [name] Primary Care Coda Filler: [place], [place] Other Clinician: Referring Daena Alper: Treating Kaile Bixler/Extender: Graciela Husbands, [name]: 363 Wound Status Wound Number: 52 Primary Etiology: Venous Leg Ulcer Wound Location: Right, Dorsal Foot Wound Status: Open Wounding Event: Gradually Appeared Comorbid History: Sleep Apnea, Hypertension, Paraplegia Date Acquired: 03/27/2021 Weeks Of Treatment: 90 Clustered Wound: No Photos Mcgowen, Demaryius E ([id number]) [id number] Page 11 of 21 Wound Measurements Length: (cm) 1.8 Width: (cm) 3.7 Depth: (cm) 0.1 Area: (cm) 5.231 Volume: (cm) 0.523 % Reduction in Area: -177.5% % Reduction in Volume: -178.2% Epithelialization: Small (1-33%) Tunneling: No Undermining: No Wound Description Classification: Full Thickness Without Exposed Support Structures Wound Margin: Distinct, outline attached Exudate Amount: Medium Exudate Type: Serosanguineous Exudate Color: red, brown Foul Odor After Cleansing: No Slough/Fibrino Yes Wound Bed Granulation Amount: Small (1-33%) Exposed Structure Granulation Quality: Red Fascia Exposed: No Necrotic Amount: Large (67-100%) Fat Layer (Subcutaneous Tissue) Exposed: Yes Necrotic Quality: Adherent Slough Tendon Exposed: No Muscle Exposed: No Joint Exposed: No Bone Exposed: No Periwound Skin Texture Texture Color No Abnormalities Noted: Yes No Abnormalities Noted: No Erythema: Yes Moisture Erythema Measurement:  Measured No Abnormalities Noted: No 3 cm Dry / Scaly: Yes Temperature / Pain Temperature: No Abnormality Treatment Notes Wound #52 (Foot) Wound Laterality: Dorsal, Right Cleanser Soap and Water Discharge Instruction: May shower and wash wound with dial antibacterial soap and water prior to dressing change. Wound Cleanser Discharge Instruction: Cleanse the wound with wound cleanser prior to applying a clean dressing using gauze sponges, not tissue or cotton balls. Peri-Wound Care Sween Lotion (Moisturizing lotion) Discharge Instruction: Apply Aquaphor moisturizing lotion as directed Topical Primary Dressing Sorbalgon AG Dressing 2x2 (in/in) Discharge Instruction: Apply to wound bed as instructed Secondary Dressing Woven Gauze Sponge, Non-Sterile 4x4 in Discharge Instruction: Apply over primary dressing as directed. Secured With [employer], 4.5x3.1 (in/yd) Discharge Instruction: Secure with Kerlix as directed. 18M Medipore H Soft Cloth Surgical Tape, 4 x 10 (in/yd) Bear, Haaris E ([id number]) [id number] Page 12 of 21 Discharge Instruction: Secure with tape as directed. Compression Wrap Compression Stockings Circaid Juxta Lite Compression Wrap Quantity: 1 Right Leg Compression Amount: 30-40 mmHg Discharge Instruction: Apply Circaid Juxta Lite Compression Wrap daily as instructed. Apply first thing in the morning, remove at night before bed. Add-Ons Electronic Signature(s) Signed: 12/21/2022 5:44:44 PM By: Baruch Gouty RN, BSN Entered By: Baruch Gouty on 12/21/2022 08:52:16 -------------------------------------------------------------------------------- Wound Assessment Details Patient Name: Date of Service: Hocevar, [name] 12/21/2022 8:15 A M Medical Record Number: [id number] Patient Account Number:  [id number] Date of Birth/Sex: Treating RN: 06/14/1986 (37 y.o. [name] Primary Care Jerelene Salaam: [place], [place] Other  Clinician: Referring Nohea Kras: Treating Sally Menard/Extender: Graciela Husbands, [name]: 363 Wound Status Wound Number: 56 Primary Etiology: Neuropathic Ulcer-Non Diabetic Wound Location: Left, Dorsal Foot Wound Status: Open Wounding Event: Gradually Appeared Comorbid History: Sleep Apnea, Hypertension, Paraplegia Date Acquired: 07/11/2021 Weeks Of Treatment: 74 Clustered Wound: Yes Photos Wound Measurements Length: (cm) 0.3 Width: (cm) 0.3 Depth: (cm) 0.1 Area: (cm) 0.071 Volume: (cm) 0.007 % Reduction in Area: 98.9% % Reduction in Volume: 98.9% Epithelialization: Large (67-100%) Tunneling: No Undermining: No Wound Description Classification: Full Thickness Without Exposed Support Structures Wound Margin: Flat and Intact Exudate Amount: Medium Exudate Type: Serous Exudate Color: amber Foul Odor After Cleansing: No Slough/Fibrino Yes Wound Bed Granulation Amount: Large (67-100%) Exposed Structure Granulation Quality: Red, Pink Fascia Exposed: No Necrotic Amount: None Present (0%) Fat Layer (Subcutaneous Tissue) Exposed: Yes Zeigler, Tuan E ([id number]) [id number] Page 13 of 21 Tendon Exposed: No Muscle Exposed: No Joint Exposed: No Bone Exposed: No Periwound Skin Texture Texture Color No Abnormalities Noted: Yes No Abnormalities Noted: No Erythema: Yes Moisture Erythema Measurement: Measured No Abnormalities Noted: No 6 cm Dry / Scaly: Yes Erythema Change: No Change Maceration: No Temperature / Pain Temperature: No Abnormality Treatment Notes Wound #56 (Foot) Wound Laterality: Dorsal, Left Cleanser Soap and Water Discharge Instruction: May shower and wash wound with dial antibacterial soap and water prior to dressing change. Wound Cleanser Discharge Instruction: Cleanse the wound with wound cleanser prior to applying a clean dressing using gauze sponges, not tissue or cotton balls. Peri-Wound Care Sween  Lotion (Moisturizing lotion) Discharge Instruction: Apply Aquaphor moisturizing lotion as directed Topical Primary Dressing Sorbalgon AG Dressing 2x2 (in/in) Discharge Instruction: Apply to wound bed as instructed Secondary Dressing Woven Gauze Sponge, Non-Sterile 4x4 in Discharge Instruction: Apply over primary dressing as directed. Secured With [employer], 4.5x3.1 (in/yd) Discharge Instruction: Secure with Kerlix as directed. 22M Medipore H Soft Cloth Surgical T ape, 4 x 10 (in/yd) Discharge Instruction: Secure with tape as directed. Compression Wrap Compression Stockings Add-Ons Electronic Signature(s) Signed: 12/21/2022 5:44:44 PM By: Baruch Gouty RN, BSN Entered By: Baruch Gouty on 12/21/2022 08:53:03 -------------------------------------------------------------------------------- Wound Assessment Details Patient Name: Date of Service: Vanegas, [name] 12/21/2022 8:15 A M Medical Record Number: [id number] Patient Account Number: [id number] Date of Birth/Sex: Treating RN: [birth year] (37 y.o. [name] Primary Care Georgio Hattabaugh: [place], [place] Other Clinician: Referring Creighton Longley: Treating Roshaunda Starkey/Extender: Graciela Husbands, [name]: 363 Wound Status Wound Number: 65 Primary Etiology: Pressure Ulcer Gayden, Uchenna E ([id number]) [id number] Page 14 of 21 Wound Location: Left Upper Leg Wound Status: Open Wounding Event: Pressure Injury Comorbid History: Sleep Apnea, Hypertension, Paraplegia Date Acquired: 05/24/2022 Weeks Of Treatment: 29 Clustered Wound: No Photos Wound Measurements Length: (cm) 0.7 Width: (cm) 1 Depth: (cm) 0.1 Area: (cm) 0.55 Volume: (cm) 0.055 % Reduction in Area: 91.2% % Reduction in Volume: 91.2% Epithelialization: Large (67-100%) Tunneling: No Undermining: No Wound Description Classification: Category/Stage III Wound Margin: Flat and Intact Exudate Amount: Medium Exudate  Type: Serosanguineous Exudate Color: red, brown Foul Odor After Cleansing: No Slough/Fibrino Yes Wound Bed Granulation Amount: Large (67-100%) Exposed Structure Granulation Quality: Red Fascia Exposed: No Necrotic Amount: None Present (0%) Fat Layer (Subcutaneous Tissue) Exposed: Yes Tendon Exposed: No Muscle Exposed: No Joint Exposed: No Bone Exposed: No Periwound Skin Texture Texture Color No Abnormalities Noted: No No Abnormalities Noted: Yes Excoriation: No Temperature / Pain Scarring:  Yes Temperature: No Abnormality Moisture No Abnormalities Noted: No Dry / Scaly: Yes Maceration: No Treatment Notes Wound #65 (Upper Leg) Wound Laterality: Left Cleanser Soap and Water Discharge Instruction: May shower and wash wound with dial antibacterial soap and water prior to dressing change. Wound Cleanser Discharge Instruction: Cleanse the wound with wound cleanser prior to applying a clean dressing using gauze sponges, not tissue or cotton balls. Peri-Wound Care Zinc Oxide Ointment 30g tube Discharge Instruction: Apply Zinc Oxide to periwound with each dressing change Topical Keystone antibiotic compound Discharge Instruction: thin layer to wound bed, use daily once available Primary [place], [place] ([id number]) [id number] Page 15 of 21 Discharge Instruction: Apply to wound bed as instructed Secondary Dressing Zetuvit Plus Silicone Border Dressing 7x7(in/in) Discharge Instruction: Apply silicone border over primary dressing as directed. Secured With Compression Wrap Compression Stockings Environmental education officer) Signed: 12/21/2022 5:44:44 PM By: Baruch Gouty RN, BSN Entered By: Baruch Gouty on 12/21/2022 09:04:40 -------------------------------------------------------------------------------- Wound Assessment Details Patient Name: Date of Service: Kreitz, [name] 12/21/2022 8:15 A M Medical Record  Number: [id number] Patient Account Number: [id number] Date of Birth/Sex: Treating RN: 12-Sep-1986 (37 y.o. [name] Primary Care Tennis Mckinnon: [place], [place] Other Clinician: Referring Shey Yott: Treating Bryanah Sidell/Extender: Graciela Husbands, [name]: 363 Wound Status Wound Number: 66 Primary Etiology: Venous Leg Ulcer Wound Location: Right, Anterior Ankle Wound Status: Open Wounding Event: Pressure Injury Comorbid History: Sleep Apnea, Hypertension, Paraplegia Date Acquired: 06/15/2022 Weeks Of Treatment: 27 Clustered Wound: No Photos Wound Measurements Length: (cm) 0.1 Width: (cm) 0.1 Depth: (cm) 0.1 Area: (cm) 0.008 Volume: (cm) 0.001 % Reduction in Area: 99.4% % Reduction in Volume: 99.2% Epithelialization: Large (67-100%) Tunneling: No Undermining: No Wound Description Classification: Full Thickness Without Exposed Suppor Wound Margin: Flat and Intact Exudate Amount: Small Exudate Type: Serosanguineous Exudate Color: red, brown t Structures Foul Odor After Cleansing: No Slough/Fibrino Yes Wound Bed Granulation Amount: None Present (0%) Exposed Structure Necrotic Amount: None Present (0%) Fascia Exposed: No Fat Layer (Subcutaneous Tissue) Exposed: Yes Hanton, Jjesus E ([id number]) [id number] Page 16 of 21 Tendon Exposed: No Muscle Exposed: No Joint Exposed: No Bone Exposed: No Periwound Skin Texture Texture Color No Abnormalities Noted: Yes No Abnormalities Noted: Yes Moisture Temperature / Pain No Abnormalities Noted: No Temperature: No Abnormality Dry / Scaly: Yes Treatment Notes Wound #66 (Ankle) Wound Laterality: Right, Anterior Cleanser Soap and Water Discharge Instruction: May shower and wash wound with dial antibacterial soap and water prior to dressing change. Wound Cleanser Discharge Instruction: Cleanse the wound with wound cleanser prior to applying a clean dressing using gauze sponges, not  tissue or cotton balls. Peri-Wound Care Sween Lotion (Moisturizing lotion) Discharge Instruction: Apply Aquaphor moisturizing lotion as directed Topical Primary Dressing Sorbalgon AG Dressing 2x2 (in/in) Discharge Instruction: Apply to wound bed as instructed Secondary Dressing Secured With Kerlix Roll Sterile, 4.5x3.1 (in/yd) Discharge Instruction: Secure with Kerlix as directed. 74M Medipore H Soft Cloth Surgical T ape, 4 x 10 (in/yd) Discharge Instruction: Secure with tape as directed. Compression Wrap Compression Stockings Add-Ons Electronic Signature(s) Signed: 12/21/2022 5:44:44 PM By: Baruch Gouty RN, BSN Entered By: Baruch Gouty on 12/21/2022 08:53:57 -------------------------------------------------------------------------------- Wound Assessment Details Patient Name: Date of Service: Perrone, [name] 12/21/2022 8:15 A M Medical Record Number: [id number] Patient Account Number: [id number] Date of Birth/Sex: Treating RN: 1986/02/14 (37 y.o. [name] Primary Care Mazzy Santarelli: [place], [place] Other Clinician: Referring Iretta Mangrum: Treating Avyon Herendeen/Extender: Graciela Husbands, [name]: 363 Wound Status  Wound Number: 67 Primary Etiology: Venous Leg Ulcer Wound Location: Left, Lateral Lower Leg Wound Status: Open Wounding Event: Gradually Appeared Comorbid History: Sleep Apnea, Hypertension, Paraplegia Date Acquired: 06/22/2022 Weeks Of Treatment: 25 Clustered Wound: No Sidle, Dick E ([id number]) [id number] Page 17 of 21 Photos Wound Measurements Length: (cm) 1.4 Width: (cm) 0.8 Depth: (cm) 0.1 Area: (cm) 0.88 Volume: (cm) 0.088 % Reduction in Area: 84.3% % Reduction in Volume: 84.3% Epithelialization: Medium (34-66%) Tunneling: No Undermining: No Wound Description Classification: Full Thickness Without Exposed Support Structures Wound Margin: Distinct, outline attached Exudate Amount: Medium Exudate  Type: Serosanguineous Exudate Color: red, brown Foul Odor After Cleansing: No Slough/Fibrino No Wound Bed Granulation Amount: Large (67-100%) Exposed Structure Granulation Quality: Red Fascia Exposed: No Necrotic Amount: Small (1-33%) Fat Layer (Subcutaneous Tissue) Exposed: Yes Necrotic Quality: Adherent Slough Tendon Exposed: No Muscle Exposed: No Joint Exposed: No Bone Exposed: No Periwound Skin Texture Texture Color No Abnormalities Noted: Yes No Abnormalities Noted: No Erythema: Yes Moisture Erythema Location: [place] No Abnormalities Noted: No Erythema Change: No Change Dry / Scaly: Yes Temperature / Pain Temperature: No Abnormality Treatment Notes Wound #67 (Lower Leg) Wound Laterality: Left, Lateral Cleanser Soap and Water Discharge Instruction: May shower and wash wound with dial antibacterial soap and water prior to dressing change. Wound Cleanser Discharge Instruction: Cleanse the wound with wound cleanser prior to applying a clean dressing using gauze sponges, not tissue or cotton balls. Peri-Wound Care Triamcinolone 15 (g) Discharge Instruction: Use triamcinolone 15 (g) as directed Sween Lotion (Moisturizing lotion) Discharge Instruction: Apply Aquaphor moisturizing lotion as directed Topical Primary Dressing Sorbalgon AG Dressing 2x2 (in/in) Discharge Instruction: Apply to wound bed as instructed Secondary Dressing Woven Gauze Sponge, Non-Sterile 4x4 in Discharge Instruction: Apply over primary dressing as directed. Causer, Nakeem E ([id number]) [phone].pdf Page 18 of 21 Secured With [employer], 4.5x3.1 (in/yd) Discharge Instruction: Secure with Kerlix as directed. 46M Medipore H Soft Cloth Surgical T ape, 4 x 10 (in/yd) Discharge Instruction: Secure with tape as directed. Compression Wrap Compression Stockings Add-Ons Electronic Signature(s) Signed: 12/21/2022 5:44:44 PM By: Baruch Gouty RN, BSN Entered By:  Baruch Gouty on 12/21/2022 08:54:41 -------------------------------------------------------------------------------- Wound Assessment Details Patient Name: Date of Service: Mollenkopf, [name] 12/21/2022 8:15 A M Medical Record Number: [id number] Patient Account Number: [id number] Date of Birth/Sex: Treating RN: [birth year] (37 y.o. [name] Primary Care Corsica Franson: [place], [place] Other Clinician: Referring Tyheem Boughner: Treating Cherre Kothari/Extender: Graciela Husbands, [name]: 363 Wound Status Wound Number: 71 Primary Etiology: Venous Leg Ulcer Wound Location: Right, Lateral Lower Leg Wound Status: Open Wounding Event: Gradually Appeared Comorbid History: Sleep Apnea, Hypertension, Paraplegia Date Acquired: 12/06/2022 Weeks Of Treatment: 0 Clustered Wound: No Photos Wound Measurements Length: (cm) 0.6 Width: (cm) 0.7 Depth: (cm) 0.1 Area: (cm) 0.33 Volume: (cm) 0.033 % Reduction in Area: % Reduction in Volume: Epithelialization: Small (1-33%) Tunneling: No Undermining: No Wound Description Classification: Full Thickness Without Exposed Support Structures Wound Margin: Flat and Intact Exudate Amount: Medium Exudate Type: Serosanguineous Exudate Color: red, brown Foul Odor After Cleansing: No Slough/Fibrino Yes Wound Bed Granulation Amount: Small (1-33%) Exposed Structure Granulation Quality: Red Fascia Exposed: No Necrotic Amount: Large (67-100%) Fat Layer (Subcutaneous Tissue) Exposed: Yes Necrotic Quality: Adherent Slough Tendon Exposed: No Pousson, Davione E ([id number]) [id number] Page 19 of 21 Muscle Exposed: No Joint Exposed: No Bone Exposed: No Periwound Skin Texture Texture Color No Abnormalities Noted: Yes No Abnormalities Noted: Yes Moisture Temperature / Pain No Abnormalities Noted: Yes Temperature: No Abnormality  Treatment Notes Wound #71 (Lower Leg) Wound Laterality: Right, Lateral Cleanser Soap and  Water Discharge Instruction: May shower and wash wound with dial antibacterial soap and water prior to dressing change. Wound Cleanser Discharge Instruction: Cleanse the wound with wound cleanser prior to applying a clean dressing using gauze sponges, not tissue or cotton balls. Peri-Wound Care Sween Lotion (Moisturizing lotion) Discharge Instruction: Apply Aquaphor moisturizing lotion as directed Topical Primary Dressing Sorbalgon AG Dressing 2x2 (in/in) Discharge Instruction: Apply to wound bed as instructed Secondary Dressing Woven Gauze Sponge, Non-Sterile 4x4 in Discharge Instruction: Apply over primary dressing as directed. Secured With [employer], 4.5x3.1 (in/yd) Discharge Instruction: Secure with Kerlix as directed. 50M Medipore H Soft Cloth Surgical T ape, 4 x 10 (in/yd) Discharge Instruction: Secure with tape as directed. Compression Wrap Compression Stockings Add-Ons Electronic Signature(s) Signed: 12/21/2022 5:44:44 PM By: Baruch Gouty RN, BSN Entered By: Baruch Gouty on 12/21/2022 08:56:50 -------------------------------------------------------------------------------- Wound Assessment Details Patient Name: Date of Service: Koury, [name] 12/21/2022 8:15 A M Medical Record Number: [id number] Patient Account Number: [id number] Date of Birth/Sex: Treating RN: [birth year] (37 y.o. [name] Primary Care Brealyn Baril: [place], [place] Other Clinician: Referring Temitope Griffing: Treating Atleigh Gruen/Extender: Graciela Husbands, [name]: 363 Wound Status Wound Number: 50 Primary Etiology: Pressure Ulcer Wound Location: Right Gluteus Wound Status: Open Wounding Event: Pressure Injury Comorbid History: Sleep Apnea, Hypertension, Paraplegia Date Acquired: 12/13/2022 Weeks Of Treatment: 0 Clustered Wound: No Friedly, Kinta E ([id number]) [id number] Page 20 of 21 Photos Wound Measurements Length: (cm) 3 Width: (cm)  1.7 Depth: (cm) 0.1 Area: (cm) 4.006 Volume: (cm) 0.401 % Reduction in Area: % Reduction in Volume: Epithelialization: Small (1-33%) Tunneling: No Undermining: No Wound Description Classification: Category/Stage III Wound Margin: Flat and Intact Exudate Amount: Medium Exudate Type: Serosanguineous Exudate Color: red, brown Foul Odor After Cleansing: No Slough/Fibrino Yes Wound Bed Granulation Amount: Medium (34-66%) Exposed Structure Granulation Quality: Red Fascia Exposed: No Necrotic Amount: Medium (34-66%) Fat Layer (Subcutaneous Tissue) Exposed: Yes Necrotic Quality: Adherent Slough Tendon Exposed: No Muscle Exposed: No Joint Exposed: No Bone Exposed: No Periwound Skin Texture Texture Color No Abnormalities Noted: Yes No Abnormalities Noted: Yes Moisture No Abnormalities Noted: Yes Treatment Notes Wound #72 (Gluteus) Wound Laterality: Right Cleanser Peri-Wound Care Topical Primary Dressing Sorbalgon AG Dressing 2x2 (in/in) Discharge Instruction: Apply to wound bed as instructed Secondary Dressing Zetuvit Plus Silicone Border Dressing 4x4 (in/in) Discharge Instruction: Apply silicone border over primary dressing as directed. Secured With Compression Wrap Compression Stockings Environmental education officer) Signed: 12/21/2022 5:44:44 PM By: Baruch Gouty RN, BSN Entered By: Baruch Gouty on 12/21/2022 09:06:51 Schoenberger, [name] ([id number]) [id number] Page 21 of 21 -------------------------------------------------------------------------------- Vitals Details Patient Name: Date of Service: Baus, [name] 12/21/2022 8:15 A M Medical Record Number: [id number] Patient Account Number: [id number] Date of Birth/Sex: Treating RN: Aug 03, 1986 (37 y.o. [name] Primary Care Cinque Begley: [place], [place] Other Clinician: Referring Coal Nearhood: Treating Keilyn Nadal/Extender: Graciela Husbands, [name]: 363 Vital  Signs Time Taken: 08:28 Temperature (F): 98.5 Height (in): 70 Pulse (bpm): 125 Weight (lbs): 216 Respiratory Rate (breaths/min): 18 Body Mass Index (BMI): 31 Blood Pressure (mmHg): 116/63 Reference Range: 80 - 120 mg / dl Electronic Signature(s) Signed: 12/21/2022 5:44:44 PM By: Baruch Gouty RN, BSN Entered By: Baruch Gouty on 12/21/2022 08:29:11

## 2022-12-21 NOTE — Progress Notes (Signed)
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 perhaps 2/13 he continues on cefdinir. The erythema is still present but the warmth and swelling is improved. I am hopeful that the cellulitis part of this control. I wouldn't be surprised if there is an element of venous inflammation as well. 01/17/17. The erythema is present but better in the left leg. His left lateral leg wound still does not have [name] viable surface buttons certain parts of this long thin [name] ([id number]) [phone].pdf Page 6 of 38 wound it appears like there has been improvement in dimensions. 01/20/17; the erythema still present but much better in the left leg. I'[name] thinking this is his usual degree of chronic venous inflammation. The wound on the left leg looks somewhat better. Is less surface slough 01/27/17; erythema is back to the chronic venous inflammation. The wound on the left leg is somewhat better. I am back to the point where I like to try an Apligraf once again 02/10/17; slight improvement in wound dimensions. Apligraf #2. He is completing his doxycycline 02/14/17; patient arrives today having completed doxycycline last Thursday. This was supposed to be [name] nurse visit however once again he hasn't tense erythema from the medial part of his wound extending over the lower leg. Also erythema in his foot this is roughly in the same distribution as last time. He has baseline chronic venous  inflammation however this is [name] lot worse than the baseline I have learned to accept the on him is baseline inflammation 02/24/17- patient is here for follow-up evaluation. He is tolerating compression therapy. His voicing no complaints or concerns he is here anticipating an Apligraf 03/03/17; he arrives today with an adherent necrotic surface. I don't think this is surface is going to be amenable for Apligraf's. The erythema around his wound and on the left dorsal foot has resolved he is off antibiotics 03/10/17; better-looking surface today. I don't think he can tolerate Apligraf's. He tells me he had [name] wound VAC after [name] skin graft years ago to this area and they had difficulty with [name] seal. The erythema continues to be stable around this some degree of chronic venous inflammation but he also has recurrent cellulitis. We have been using Iodoflex 03/17/17; continued improvement in the surface and may be small changes in dimensions. Using Iodoflex which seems the only thing that will control his surface 03/24/17- He is here for follow up evaluation of his LLE lateral ulceration and ulcer to right dorsal foot/toe space. He is voicing no complaints or concerns, He is tolerating compression wrap. 03/31/17 arrives  today with [name] much healthier looking wound on the left lower extremity. We have been using Iodoflex for [name] prolonged period of time which has for the first time prepared and adequate looking wound bed although we have not had much in the way of wound dimension improvement. He also has [name] small wound between the first and second toe on the right 04/07/17; arrives today with [name] healthy-looking wound bed and at least the top 50% of this wound appears to be now her. No debridement was required I have changed him to [facility] last week after prolonged Iodoflex. He did not do well with Apligraf's. We've had [name] re-opening between the first and second toe on the right 04/14/17; arrives today with [name] healthier  looking wound bed contractions and the top 50% of this wound and some on the lesser 50%. Wound bed appears healthy. The area between the first and second toe on the right still remains problematic 04/21/17; continued very gradual improvement. Using [facility] 04/28/17; continued very gradual improvement in the left lateral leg venous insufficiency wound. His periwound erythema is very mild. We have been using Hydrofera Blue. Wound is making progress especially in the superior 50% 05/05/17; he continues to have very gradual improvement in the left lateral venous insufficiency wound. Both in terms with an length rings are improving. I debrided this every 2 weeks with #5 curet and we have been using Hydrofera Blue and again making good progress With regards to the wounds between his right first and second toe which I thought might of been tinea pedis he is not making as much progress very dry scaly skin over the area. Also the area at the base of the left first and second toe in [name] similar condition 05/12/17; continued gradual improvement in the refractory left lateral venous insufficiency wound on the left. Dimension smaller. Surface still requiring debridement using Hydrofera Blue 05/19/17; continued gradual improvement in the refractory left lateral venous ulceration. Careful inspection of the wound bed underlying rumination suggested some degree of epithelialization over the surface no debridement indicated. Continue Hydrofera Blue difficult areas between his toes first and third on the left than first and second on the right. I'[name] going to change to silver alginate from silver collagen. Continue ketoconazole as I suspect underlying tinea pedis 05/26/17; left lateral leg venous insufficiency wound. We've been using Hydrofera Blue. I believe that there is expanding epithelialization over the surface of the wound albeit not coming from the wound circumference. This is [name] bit of an odd situation in which the  epithelialization seems to be coming from the surface of the wound rather than in the exact circumference. There is still small open areas mostly along the lateral margin of the wound. He has unchanged areas between the left first and second and the right first second toes which I been treating for tenia pedis 06/02/17; left lateral leg venous insufficiency wound. We have been using Hydrofera Blue. Somewhat smaller from the wound circumference. The surface of the wound remains [name] bit on it almost epithelialized sedation in appearance. I use an open curette today debridement in the surface of all of this especially the edges Small open wounds remaining on the dorsal right first and second toe interspace and the plantar left first second toe and her face on the left 06/09/17; wound on the left lateral leg continues to be smaller but very gradual and very dry surface using Hydrofera Blue 06/16/17 requires weekly debridements now on the left lateral leg  although this continues to contract. I changed to silver collagen last week because of dryness of the wound bed. Using Iodoflex to the areas on his first and second toes/web space bilaterally 06/24/17; patient with history of paraplegia also chronic venous insufficiency with lymphedema. Has [name] very difficult wound on the left lateral leg. This has been gradually reducing in terms of with but comes in with [name] very dry adherent surface. High switch to silver collagen [name] week or so ago with hydrogel to keep the area moist. This is been refractory to multiple dressing attempts. He also has areas in his first and second toes bilaterally in the anterior and posterior web space. I had been using Iodoflex here after [name] prolonged course of silver alginate with ketoconazole was ineffective [question tinea pedis] 07/14/17; patient arrives today with [name] very difficult adherent material over his left lateral lower leg wound. He also has surrounding erythema and poorly controlled  edema. He was switched his Santyl last visit which the nurses are applying once during his doctor visit and once on [name] nurse visit. He was also reduced to 2 layer compression I'[name] not exactly sure of the issue here. 07/21/17; better surface today after 1 week of Iodoflex. Significant cellulitis that we treated last week also better. [Doxycycline] 07/28/17 better surface today with now 2 weeks of Iodoflex. Significant cellulitis treated with doxycycline. He has now completed the doxycycline and he is back to his usual degree of chronic venous inflammation/stasis dermatitis. He reminds me he has had ablations surgery here 08/04/17; continued improvement with Iodoflex to the left lateral leg wound in terms of the surface of the wound although the dimensions are better. 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 Problematic areas on the plantar aspect of the first second toe web space on the left and the dorsal aspect of the first second toe web space on the right. At one point I felt these were probably related to chronic fungal infections in treated him aggressively for this although we have not made any improvement here. 08/11/17; left lateral leg. Surface continues to improve with the Iodoflex although we are not seeing much improvement in overall wound dimensions. Areas on his plantar left foot and right foot show no improvement. In fact the right foot looks somewhat worse 08/18/17; left lateral leg. We changed to [facility] Blue last week after [name] prolonged course of Iodoflex which helps get the surface better. It appears that the wound with is improved. Continue with difficult areas on the left dorsal first second and plantar first second on the right 09/01/17; patient arrives in clinic today having had [name] temperature of 103 yesterday. He was seen in the ER and [facility]. The patient was concerned he could have cellulitis again in the right  leg however they diagnosed him with [name] UTI and he is now on Keflex. He has [name] history of cellulitis which is been recurrent and difficult but this is been in the left leg, in the past 5 use doxycycline. He does in and out catheterizations at home which are risk factors for UTI 09/08/17; patient will be completing his Keflex this weekend. The erythema on the left leg is considerably better. He has [name] new wound today on the medial part of the right leg small superficial almost looks like [name] skin tear. He has worsening of the area on the right dorsal first and second toe. His major area on the left lateral  leg is better. Using Hydrofera Blue on all areas 09/15/17; gradual reduction in width on the long wound in the left lateral leg. No debridement required. He also has wounds on the plantar aspect of his left first second toe web space and on the dorsal aspect of the right first second toe web space. 09/22/17; there continues to be very gradual improvements in the dimensions of the left lateral leg wound. He hasn't round erythematous spot with might be pressure on his wheelchair. There is no evidence obviously of infection no purulence no warmth He has [name] dry scaled area on the plantar aspect of the left first second toe Improved area on the dorsal right first second toe. 09/29/17; left lateral leg wound continues to improve in dimensions mostly with an is still [name] fairly long but increasingly narrow wound. He has [name] dry scaled area on the plantar aspect of his left first second toe web space Increasingly concerning area on the dorsal right first second toe. In fact I am concerned today about possible cellulitis around this wound. The areas extending up his second toe and although there is deformities here almost appears to abut on the nailbed. 10/06/17; left lateral leg wound continues to make very gradual progress. Tissue culture I did from the right first second toe dorsal foot last time grew MRSA and  enterococcus which was vancomycin sensitive. This was not sensitive to clindamycin or doxycycline. He is allergic to Zyvox and sulfa we have therefore arrange for him to have dalvance infusion tomorrow. He is had this in the past and tolerated it well 10/20/17; left lateral leg wound continues to make decent progress. This is certainly reduced in terms of with there is advancing epithelialization.The cellulitis in the right foot looks better although he still has [name] deep wound in the dorsal aspect of the first second toe web space. Plantar left first toe web space on the left I think is making some progress [name] ([id number]) [phone].pdf Page 7 of 38 10/27/17; left lateral leg wound continues to make decent progress. Advancing epithelialization.using Hydrofera Blue The right first second toe web space wound is better-looking using silver alginate Improvement in the left plantar first second toe web space. Again using silver alginate 11/03/17 left lateral leg wound continues to make decent progress albeit slowly. Using [facility] The right per second toe web space continues to be [name] very problematic looking punched out wound. I obtained [name] piece of tissue for deep culture I did extensively treated this for fungus. It is difficult to imagine that this is [name] pressure area as the patient states other than going outside he doesn't really wear shoes at home The left plantar first second toe web space looked fairly senescent. Necrotic edges. This required debridement change to [facility] Blue to all wound areas 11/10/17; left lateral leg wound continues to contract. Using Hydrofera Blue On the right dorsal first second toe web space dorsally. Culture I did of this area last week grew MRSA there is not an easy oral option in this patient was multiple antibiotic allergies or intolerances. This was only [name] rare culture isolate I'[name] therefore going to use Bactroban under silver  alginate On the left plantar first second toe web space. Debridement is required here. This is also unchanged 11/17/17; left lateral leg wound continues to contract using Hydrofera Blue this is no longer the major issue. The major concern here is the right first second toe web space. He now has an open area  going from dorsally to the plantar aspect. There is now wound on the inner lateral part of the first toe. Not [name] very viable surface on this. There is erythema spreading medially into the forefoot. No major change in the left first second toe plantar wound 11/24/17; left lateral leg wound continues to contract using Hydrofera Blue. Nice improvement today The right first second toe web space all of this looks [name] lot less angry than last week. I have given him clindamycin and topical Bactroban for MRSA and terbinafine for the possibility of underlining tinea pedis that I could not control with ketoconazole. Looks somewhat better The area on the plantar left first second toe web space is weeping with dried debris around the wound 12/01/17; left lateral leg wound continues to contract he Hydrofera Blue. It is becoming thinner in terms of with nevertheless it is making good improvement. The right first second toe web space looks less angry but still [name] large necrotic-looking wounds starting on the plantar aspect of the right foot extending between the toes and now extensively on the base of the right second toe. I gave him clindamycin and topical Bactroban for MRSA anterior benefiting for the possibility of underlying tinea pedis. Not looking better today The area on the left first/second toe looks better. Debrided of necrotic debris 12/05/17* the patient was worked in urgently today because over the weekend he found blood on his incontinence bad when he woke up. He was found to have an ulcer by his wife who does most of his wound care. He came in today for [place] to look at this. He has not had [name] history of  wounds in his buttocks in spite of his paraplegia. 12/08/17; seen in follow-up today at his usual appointment. He was seen earlier this week and found to have [name] new wound on his buttock. We also follow him for wounds on the left lateral leg, left first second toe web space and right first second toe web space 12/15/17; we have been using Hydrofera Blue to the left lateral leg which has improved. The right first second toe web space has also improved. Left first second toe web space plantar aspect looks stable. The left buttock has worsened using Santyl. Apparently the buttock has drainage 12/22/17; we have been using Hydrofera Blue to the left lateral leg which continues to improve now 2 small wounds separated by normal skin. He tells [place] he had [name] fever up to 100 yesterday he is prone to UTIs but has not noted anything different. He does in and out catheterizations. The area between the first and second toes today does not look good necrotic surface covered with what looks to be purulent drainage and erythema extending into the third toe. I had gotten this to something that I thought look better last time however it is not look good today. He also has [name] necrotic surface over the buttock wound which is expanded. I thought there might be infection under here so I removed [name] lot of the surface with [name] #5 curet though nothing look like it really needed culturing. He is been using Santyl to this area 12/27/17; his original wound on the left lateral leg continues to improve using Hydrofera Blue. I gave him samples of Baxdella although he was unable to take them out of fear for an allergic reaction ["lump in his throat"].the culture I did of the purulent drainage from his second toe last week showed both enterococcus and [name] set Enterobacter I  was also concerned about the erythema on the bottom of his foot although paradoxically although this looks somewhat better today. 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 We've been using silver alginate here. They came up today that he sleeps in [name] recliner, I'[name] not sure why but I asked him to stop this 01/03/18; his original wound we've been using Hydrofera Blue is now separated into 2 areas. Ulcer on his left buttock is better he is off the recliner and sleeping in bed Finally both wound areas between his first and second toes also looks some better 01/10/18; his original wound on the left lateral leg is now separated into 2 wounds we've been using Hydrofera Blue Ulcer on his left buttock has some drainage. There is [name] small probing site going into muscle layer superiorly.using silver alginate -He arrives today with [name] deep tissue injury on the left heel The wound on the dorsal aspect of his first second toe on the left looks [name] lot betterusing silver alginate ketoconazole The area on the first second toe web space on the right also looks [name] lot bette 01/17/18; his original wound on the left lateral leg continues to progress using Hydrofera Blue Ulcer on his left buttock also is smaller surface healthier except for [name] small probing site going into the muscle layer superiorly. 2.4 cm of tunneling in this area DTI on his left heel we have only been offloading. Looks better than last week no threatened open no evidence of infection the wound on the dorsal aspect of the first second toe on the left continues to look like it's regressing we have only been using silver alginate and terbinafine orally The area in the first second toe web space on the right also looks to be [name] lot better using silver alginate and terbinafine I think this was prompted by tinea pedis 01/31/18; the patient was hospitalized in [place] last week apparently for [name] complicated UTI. He was discharged on cefepime he does in and out catheterizations. In the hospital he was discovered [name] I don't mild elevation of AST and  ALT and the terbinafine was stopped.predictably the pressure ulcer on s his buttock looks betterusing silver alginate. The area on the left lateral leg also is better using Hydrofera Blue. The area between the first and second toes on the left better. First and second toes on the right still substantial but better. Finally the DTI on the left heel has held together and looks like it's resolving 02/07/18-he is here in follow-up evaluation for multiple ulcerations. He has new injury to the lateral aspect of the last issue [name] pressure ulcer, he states this is from adhesive removal trauma. He states he has tried multiple adhesive products with no success. All other ulcers appear stable. The left heel DTI is resolving. We will continue with same treatment plan and follow-up next week. 02/14/18; follow-up for multiple areas. He has [name] new area last week on the lateral aspect of his pressure ulcer more over the posterior trochanter. The original pressure ulcer looks quite stable has healthy granulation. 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 Almost fully epithelialized on the original superior area using Hydrofera Blue DTI on the left heel has peeled off this week to reveal [name] small superficial wound under denuded skin and subcutaneous tissue Both areas  between the first and second toes look better including nothing open on the left 02/21/18; The patient's wounds on his left ischial tuberosity and posterior left greater trochanter actually looked better. He has [name] large area of irritation around the area which I think is contact dermatitis. I am doubtful that this is fungal His original wound on the left lateral calf continues to improve we have been using Hydrofera Blue There is no open area in the left first second toe web space although there is [name] lot of thick callus The DTI on the left heel required  debridement today of necrotic surface eschar and subcutaneous tissue using silver alginate Finally the area on the right first second toe webspace continues to contract using silver alginate and ketoconazole 02/28/18 Left ischial tuberosity wounds look better using silver alginate. Original wound on the left calf only has one small open area left using Hydrofera Blue DTI on the left heel required debridement mostly removing skin from around this wound surface. Using silver alginate [name] ([id number]) [phone].pdf Page 8 of 38 The areas on the right first/second toe web space using silver alginate and ketoconazole 03/08/18 on evaluation today patient appears to be doing decently well as best I can tell in regard to his wounds. This is the first time that I have seen him as he generally is followed by Dr. Dellia Nims. With that being said none of his wounds appear to be infected he does have an area where there is some skin covering what appears to be [name] new wound on the left dorsal surface of his great toe. This is right at the nail bed. With that being said I do believe that debrided away some of the excess skin can be of benefit in this regard. Otherwise he has been tolerating the dressing changes without complication. 03/14/18; patient arrives today with the multiplicity of wounds that we are following. He has not been systemically unwell Original wound on the left lateral calf now only has 2 small open areas we've been using Hydrofera Blue which should continue The deep tissue injury on the left heel requires debridement today. We've been using silver alginate The left first second toe and the right first second toe are both are reminiscence what I think was tinea pedis. Apparently some of the callus Surface between the toes was removed last week when it started draining. Purulent drainage coming from the wound on the ischial tuberosity on the left. 03/21/18-He is here in  follow-up evaluation for multiple wounds. 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 He tolerated debridement. The only change to last week's recommendations is to discontinue antifungal cream between toes. He will follow-up next week 03/28/18; following up for multiple wounds;Concern this week is streaking redness and swelling in the right foot. He is going to need antibiotics for this. 03/31/18; follow-up for right foot cellulitis. Streaking redness and swelling in the right foot on 03/28/18. 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 I put him on clindamycin 300 mg every 6 and brought him in for [name] quick check. He has an open wound between his first and second toes on the right foot as [name] potential source. 04/04/18; Right foot cellulitis is resolving he is completing clindamycin. This is truly good news Left lateral calf wound which is initial wound only has one small open area inferiorly this is close to healing out. He has compression stockings. We will use  Hydrofera Blue right down to the epithelialization of this Nonviable surface on the left heel which was initially pressure with [name] DTI. We've been using Hydrofera Blue. I'[name] going to switch this back to silver alginate Left first second toe/tinea pedis this looks better using silver alginate Right first second toe tinea pedis using silver alginate Large pressure ulcers on theLeft ischial tuberosity. Small wound here Looks better. I am uncertain about the surface over the large wound. Using silver alginate 04/11/18; Cellulitis in the right foot is resolved Left lateral calf wound which was his original wounds still has 2 tiny open areas remaining this is just about closed Nonviable surface on the left heel is better but still requires debridement Left first second toe/tinea pedis still open using silver alginate Right first second toe wound tinea pedis I  asked him to go back to using ketoconazole and silver alginate Large pressure ulcers on the left ischial tuberosity this shear injury here is resolved. Wound is smaller. No evidence of infection using silver alginate 04/18/18; Patient arrives with an intense area of cellulitis in the right mid lower calf extending into the right heel area. Bright red and warm. Smaller area on the left anterior leg. He has [name] significant history of MRSA. He will definitely need antibioticsdoxycycline He now has 2 open areas on the left ischial tuberosity the original large wound and now [name] satellite area which I think was above his initial satellite areas. Not [name] wonderful surface on this satellite area surrounding erythema which looks like pressure related. His left lateral calf wound again his original wound is just about closed Left heel pressure injury still requiring debridement Left first second toe looks [name] lot better using silver alginate Right first second toe also using silver alginate and ketoconazole cream also looks better 04/20/18; the patient was worked in early today out of concerns with his cellulitis on the right leg. I had started him on doxycycline. This was 2 days ago. His wife was concerned about the swelling in the area. Also concerned about the left buttock. He has not been systemically unwell no fever chills. No nausea vomiting or diarrhea 04/25/18; the patient's left buttock wound is continued to deteriorate he is using Hydrofera Blue. He is still completing clindamycin for the cellulitis on the right leg although all of this looks better. 05/02/18 Left buttock wound still with [name] lot of drainage and [name] very tightly adherent fibrinous necrotic surface. He has [name] deeper area superiorly The left lateral calf wound is still closed DTI wound on the left heel necrotic surface especially the circumference using Iodoflex Areas between his left first second toe and right first second toe both look better.  Dorsally and the right first second toe he had [name] necrotic surface although at smaller. In using silver alginate and ketoconazole. I did [name] culture last week which was [name] deep tissue culture of the reminiscence of the open wound on the right first second toe dorsally. This grew [name] few Acinetobacter and [name] few methicillin-resistant staph aureus. Nevertheless the area actually this week looked better. I didn't feel the need to specifically address this at least in terms of systemic antibiotics. 05/09/18; wounds are measuring larger more drainage per our intake. We are using Santyl covered with alginate on the large superficial buttock wounds, Iodosorb on the left heel, ketoconazole and silver alginate to the dorsal first and second toes bilaterally. 05/16/18; The area on his left buttock better in some aspects although the area superiorly over  the ischial tuberosity required an extensive debridement.using Santyl Left heel appears stable. Using Iodoflex The areas between his first and second toes are not bad however there is spreading erythema up the dorsal aspect of his left foot this looks like cellulitis again. He is insensate the erythema is really very brilliant.o Erysipelas He went to see an allergist days ago because he was itching part of this he had lab work done. This showed [name] white count of 15.1 with 70% neutrophils. Hemoglobin of 11.4 and [name] platelet count of 659,000. Last white count we had in Epic was [name] 2-1/2 years ago which was 25.9 but he was ill at the time. He was able to show me some lab work that was done by his primary physician the pattern is about the same. 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 But prompted me to go ahead and do x-rays of both feet and the pelvis rule out osteomyelitis. He also had [name] comprehensive metabolic panel this was reasonably normal his albumin was 3.7 liver function tests BUN/creatinine all normal 05/23/18; x-rays  of both his feet from last week were negative for underlying pulmonary abnormality. The x-ray of his pelvis however showed mild irregularity in the left ischial which may represent some early osteomyelitis. The wound in the left ischial continues to get deeper clearly now exposed muscle. Each week necrotic surface material over this area. Whereas the rest of the wounds do not look so bad. The left ischial wound we have been using Santyl and calcium alginate T the left heel surface necrotic debris using Iodoflex o The left lateral leg is still healed Areas on the left dorsal foot and the right dorsal foot are about the same. There is some inflammation on the left which might represent contact dermatitis, fungal dermatitis I am doubtful cellulitis although this looks better than last week 05/30/18; CT scan done at Hospital did not show any osteomyelitis or abscess. Suggested the possibility of underlying cellulitis although I don't see [name] lot of evidence of this at the bedside The wound itself on the left buttock/upper thigh actually looks somewhat better. No debridement Left heel also looks better no debridement continue Iodoflex Both dorsal first second toe spaces appear better using Lotrisone. Left still required debridement 06/06/18; Intake reported some purulent looking drainage from the left gluteal wound. Using Santyl and calcium alginate Left heel looks better although still [name] nonviable surface requiring debridement The left dorsal foot first/second webspace actually expanding and somewhat deeper. I may consider doing [name] shave biopsy of this area Right dorsal foot first/second webspace appears stable to improved. Using Lotrisone and silver alginate to both these areas 06/13/18 Left gluteal surface looks better. Now separated in the 2 wounds. No debridement required. Still drainage. We'll continue silver alginate Stemmer, Chaddrick [name] ([id number]) [phone].pdf Page 9 of 38 Left  heel continues to look better with Iodoflex continue this for at least another week Of his dorsal foot wounds the area on the left still has some depth although it looks better than last week. We've been using Lotrisone and silver alginate 06/20/18 Left gluteal continues to look better healthy tissue Left heel continues to look better healthy granulation wound is smaller. He is using Iodoflex and his long as this continues continue the Iodoflex Dorsal right foot looks better unfortunately dorsal left foot does not. There is swelling and erythema of his forefoot. He had minor trauma to this several days ago but  doesn't think this was enough to have caused any tissue injury. Foot looks like cellulitis, we have had this problem before 06/27/18 on evaluation today patient appears to be doing [name] little worse in regard to his foot ulcer. 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 Both of which would really be his only options for treating this infection. In the past he has been given and effusion of Orbactiv. This is done very well for him in the past again it's one time dosing IV antibiotic therapy. Subsequently I do believe this is something we're gonna need to see about doing at this point in time. Currently his other wounds seem to be doing somewhat better in my pinion I'[name] pretty happy in that regard. 07/03/18 on evaluation today patient's wounds actually appear to be doing fairly well. He has been tolerating the dressing changes without complication. All in all he seems to be showing signs of improvement. In regard to the antibiotics he has been dealing with infectious disease since I saw him last week as far as getting this scheduled. In the end [facility]'s [facility] this coming Friday. In the meantime he has  been continuing to perform the dressing changes in such as previous. There does not appear to be any evidence of infection worsengin at this time. 07/10/18; Since I last saw this man 2 weeks ago things have actually improved. IV antibiotics of resulted in less forefoot erythema although there is still some present. He is not systemically unwell Left buttock wounds 2 now have no depth there is increased epithelialization Using silver alginate Left heel still requires debridement using Iodoflex Left dorsal foot still with [name] sizable wound about the size of [name] border but healthy granulation Right dorsal foot still with [name] slitlike area using silver alginate 07/18/18; the patient's cellulitis in the left foot is improved in fact I think it is on its way to resolving. Left buttock wounds 2 both look better although the larger one has hypertension granulation we've been using silver alginate Left heel has some thick circumferential redundant skin over the wound edge which will need to be removed today we've been using Iodoflex Left dorsal foot is still [name] sizable wound required debridement using silver alginate The right dorsal foot is just about closed only [name] small open area remains here 07/25/18; left foot cellulitis is resolved Left buttock wounds 2 both look better. Hyper-granulation on the major area Left heel as some debris over the surface but otherwise looks [name] healthier wound. Using silver collagen Right dorsal foot is just about closed 07/31/18; arrives with our intake nurse worried about purulent drainage from the buttock. We had hyper-granulation here last week His buttock wounds 2 continue to look better Left heel some debris over the surface but measuring smaller. Right dorsal foot unfortunately has openings between the toes Left foot superficial wound looks less aggravated. 08/07/18 Buttock wounds continue to look better although some of her granulation and the larger medial wound. silver  alginate Left heel continues to look [name] lot better.silver collagen Left foot superficial wound looks less stable. Requires debridement. He has [name] new wound superficial area on the foot on the lateral dorsal foot. Right foot looks better using silver alginate without Lotrisone 08/14/2018; patient was in the ER last week diagnosed with  [name] UTI. He is now on Cefpodoxime and Macrodantin. Buttock wounds continued to be smaller. Using silver alginate Left heel continues to look better using silver collagen Left foot superficial wound looks as though it is improving Right dorsal foot area is just about healed. 08/21/2018; patient is completed his antibiotics for his UTI. He has 2 open areas on the buttocks. There is still not closed although the surface looks satisfactory. Using silver alginate Left heel continues to improve using silver collagen The bilateral dorsal foot areas which are at the base of his first and second toes/possible tinea pedis are actually stable on the left but worse on the right. The area on the left required debridement of necrotic surface. After debridement I obtained [name] specimen for PCR culture. The right dorsal foot which is been just about healed last week is now reopened 08/28/2018; culture done on the left dorsal foot showed coag negative staph both staph epidermidis and Lugdunensis. I think this is worthwhile initiating systemic treatment. I will use doxycycline given his long list of allergies. The area on the left heel slightly improved but still requiring debridement. The large wound on the buttock is just about closed whereas the smaller one is larger. Using silver alginate in this area 09/04/2018; patient is completing his doxycycline for the left foot although this continues to be [name] very difficult wound area with very adherent necrotic debris. We are using silver alginate to all his wounds right foot left foot and the small wounds on his buttock, silver collagen on the left  heel. 09/11/2018; once again this patient has intense erythema and swelling of the left forefoot. Lesser degrees of erythema in the right foot. He has [name] long list of allergies and intolerances. I will reinstitute doxycycline. 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 Using silver alginate Left heel also looks better using silver collagen Unfortunately both the areas on his feet look worse. The area on the left first second webspace is now gone through to the plantar part of his foot. The area on the left foot anteriorly is irritated with erythema and swelling in the forefoot. 09/25/2018 His wound on the left plantar heel looks better. Using silver collagen The area on the left buttock 2 small remnant areas. One is closed one is still open. Using silver alginate The areas between both his first and second toes look worse. This in spite of long-standing antifungal therapy with ketoconazole and silver alginate which should have antifungal activity He has small areas around his original wound on the left calf one is on the bottom of the original scar tissue and one superiorly both of these are small and superficial but again given wound history in this site this is worrisome 10/02/2018 Left plantar heel continues to gradually contract using silver collagen Left buttock wound is unchanged using silver alginate The areas on his dorsal feet between his first and second toes bilaterally look about the same. I prescribed clindamycin ointment to see if we can address chronic staph colonization and also the underlying possibility of erythrasma The left lateral lower extremity wound is actually on the lateral part of his ankle. Small open area here. We have been using silver alginate 10/09/2018; Left plantar heel continues to look healthy and contract. No debridement is required Left buttock slightly smaller with [name] tape injury wound just below which was new  this week Dorsal feet somewhat improved I have been using clindamycin  Left lateral looks lower extremity the actual open area looks worse although [name] lot of this is epithelialized. I am going to change to silver collagen today He has [name] lot more swelling in the right leg although this is not pitting not red and not particularly warm there is [name] lot of spasm in the right leg usually indicative of people with paralysis of some underlying discomfort. We have reviewed his vascular status from 2017 he had [name] left greater saphenous vein ablation. I wonder about referring him back to vascular surgery if the area on the left leg continues to deteriorate. Grubb, Jhordan [name] ([id number]) [phone].pdf Page 10 of 38 10/16/2018 in today for follow-up and management of multiple lower extremity ulcers. His left Buttock wound is much lower smaller and almost closed completely. The wound to the left ankle has began to reopen with Epithelialization and some adherent slough. He has multiple new areas to the left foot and leg. The left dorsal foot without much improvement. Wound present between left great webspace and 2nd toe. Erythema and edema present right leg. Right LE ultrasound obtained on 10/10/18 was negative for DVT . 10/23/2018; Left buttock is closed over. Still dry macerated skin but there is no open wound. I suspect this is chronic pressure/moisture Left lateral calf is quite [name] bit worse than when I saw this last. There is clearly drainage here he has macerated skin into the left plantar heel. We will change the primary dressing to alginate Left dorsal foot has some improvement in overall wound area. Still using clindamycin and silver alginate Right dorsal foot about the same as the left using clindamycin and silver alginate The erythema in the right leg has resolved. He is DVT rule out was negative Left heel pressure area required debridement although the wound is smaller and the surface  is health 10/26/2018 The patient came back in for his nurse check today predominantly because of the drainage coming out of the left lateral leg with [name] recent reopening of his original wound on the left lateral calf. He comes in today with [name] large amount of surrounding erythema around the wound extending from the calf into the ankle and even in the area on the dorsal foot. He is not systemically unwell. He is not febrile. Nevertheless this looks like cellulitis. We have been using silver alginate to the area. I changed him to [name] regular visit and I am going to prescribe him doxycycline. The rationale here is [name] long list of medication intolerances and [name] history of MRSA. I did not see anything that I thought would provide [name] valuable culture 10/30/2018 Follow-up from his appointment 4 days ago with really an extensive area of cellulitis in the left calf left lateral ankle and left dorsal foot. I put him on doxycycline. He has [name] long list of medication allergies which are true allergy reactions. 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 In any case he is [name] lot better today. The erythema especially in the anterior and lateral left calf is better. He still has left ankle erythema. He also is complaining about increasing edema in the right leg we have only been using Kerlix Coban and he has been doing the wraps at home. Finally he has [name] spotty rash on the medial part of his upper left calf which looks like folliculitis or perhaps wrap occlusion type injury. Small superficial macules not pustules 11/06/18 patient arrives today with again  [name] considerable degree of erythema around the wound on the left lateral calf extending into the dorsal ankle and dorsal foot. This is [name] lot worse than when I saw this last week. He is on doxycycline really with not [name] lot of improvement. He has not been systemically unwell Wounds on the; left heel actually  looks improved. Original area on the left foot and proximity to the first and second toes looks about the same. He has superficial areas on the dorsal foot, anterior calf and then the reopening of his original wound on the left lateral calf which looks about the same The only area he has on the right is the dorsal webspace first and second which is smaller. He has [name] large area of dry erythematous skin on the left buttock small open area here. 11/13/2018; the patient arrives in much better condition. The erythema around the wound on the left lateral calf is [name] lot better. Not sure whether this was the clindamycin or the TCA and ketoconazole or just in the improvement in edema control [stasis dermatitis]. In any case this is [name] lot better. The area on the left heel is very small and just about resolved using silver collagen we have been using silver alginate to the areas on his dorsal feet 11/20/2018; his wounds include the left lateral calf, left heel, dorsal aspects of both feet just proximal to the first second webspace. He is stable to slightly improved. I did not think any changes to his dressings were going to be necessary 11/27/2018 he has [name] reopening on the left buttock which is surrounded by what looks like tinea or perhaps some other form of dermatitis. The area on the left dorsal foot has some erythema around it I have marked this area but I am not sure whether this is cellulitis or not. Left heel is not closed. Left calf the reopening is really slightly longer and probably worse 1/13; in general things look better and smaller except for the left dorsal foot. Area on the left heel is just about closed, left buttock looks better only [name] small wound remains in the skin looks better [using Lotrisone] 1/20; the area on the left heel only has [name] few remaining open areas here. Left lateral calf about the same in terms of size, left dorsal foot slightly larger right lateral foot still not closed. The area  on the left buttock has no open wound and the surrounding skin looks [name] lot better 1/27; the area on the left heel is closed. Left lateral calf better but still requiring extensive debridements. The area on his left buttock is closed. He still has the open areas on the left dorsal foot which is slightly smaller in the right foot which is slightly expanded. We have been using Iodoflex on these areas as well 2/3; left heel is closed. Left lateral calf still requiring debridement using Iodoflex there is no open area on his left buttock however he has dry scaly skin over [name] large area of this. Not really responding well to the Lotrisone. Finally the areas on his dorsal feet at the level of the first second webspace are slightly smaller on the right and about the same on the left. Both of these vigorously debrided with Anasept and gauze 2/10; left heel remains closed he has dry erythematous skin over the left buttock but there is no open wound here. Left lateral leg has come in and with. Still requiring debridement we have been using Iodoflex here.  Finally the area on the left dorsal foot and right dorsal foot are really about the same extremely dry callused fissured areas. He does not yet have [name] dermatology appointment 2/17; left heel remains closed. He has [name] new open area on the left buttock. The area on the left lateral calf is bigger longer and still covered in necrotic debris. No major change in his foot areas bilaterally. I am awaiting for [name] dermatologist to look on this. We have been using ketoconazole I do not know that this is been doing any good at all. 2/24; left heel remains closed. The left buttock wound that was new reopening last week looks better. The left lateral calf appears better also although still requires debridement. The major area on his foot is the left first second also requiring debridement. We have been putting Prisma on all wounds. I do not believe that the ketoconazole has done  too much good for his feet. He will use Lotrisone I am going to give him [name] 2-week course of terbinafine. We still do not have [name] dermatology appointment 3/2 left heel remains closed however there is skin over bone in this area I pointed this out to him today. The left buttock wound is epithelialized but still does not look completely stable. The area on the left leg required debridement were using silver collagen here. With regards to his feet we changed to Lotrisone last week and silver alginate. 3/9; left heel remains closed. Left buttock remains closed. The area on the right foot is essentially closed. The left foot remains unchanged. Slightly smaller on the left lateral calf. Using silver collagen to both of these areas 3/16-Left heel remains closed. Area on right foot is closed. Left lateral calf above the lateral malleolus open wound requiring debridement with easy bleeding. Left dorsal wound proximal to first toe also debrided. Left ischial area open new. Patient has been using Prisma with wrapping every 3 days. Dermatology appointment is apparently tomorrow.Patient has completed his terbinafine 2-week course with some apparent improvement according to him, there is still flaking and dry skin in his foot on the left 3/23; area on the right foot is reopened. The area on the left anterior foot is about the same still [name] very necrotic adherent surface. He still has the area on the left leg and reopening is on the left buttock. He apparently saw dermatology although I do not have [name] note. According to the patient who is usually fairly well informed they did not have any good ideas. Put him on oral terbinafine which she is been on before. 3/30; using silver collagen to all wounds. Apparently his dermatologist put him on doxycycline and rifampin presumably some culture grew staph. I do not have this result. He remains on terbinafine although I have used terbinafine on him before 4/6; patient has had [name]  fairly substantial reopening on the right foot between the first and second toes. He is finished his terbinafine and I believe is on doxycycline and rifampin still as prescribed by dermatology. We have been using silver collagen to all his wounds although the patient reports that he thinks silver alginate does better on the wounds on his buttock. 4/13; the area on his left lateral calf about the same size but it did not require debridement. Left dorsal foot just proximal to the webspace between the first and second toes is about the same. Still nonviable surface. I note some superficial bronze discoloration of the dorsal part of his foot Right  dorsal foot just proximal to the first and second toes also looks about the same. I still think there may be the same discoloration I noted above on the left Left buttock wound looks about the same 4/20; left lateral calf appears to be gradually contracting using silver collagen. 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 The left dorsal foot wound is debrided of tightly adherent necrotic debris and really cleans up quite nicely. The right area is worse with expansion. I did not debride this it is now over the base of the second toe The area on his left buttock is smaller no debridement is required using silver collagen 5/4; left calf continues to make good progress. Guardado, Orvan [name] ([id number]) [id number].pdf Page 11 of 38 He arrives with erythema around the wounds on his dorsal foot which even extends to the plantar aspect. Very concerning for coexistent infection. He is finished the erythromycin I gave him for possible erythrasma this does not seem to have helped. The area on the left foot is about the same base of the dorsal toes Is area on the buttock looks improved on the left 5/11; left calf and left buttock continued to make good progress. Left foot is about the same to  slightly improved. Major problem is on the right foot. He has not had an x-ray. Deep tissue culture I did last week showed both Enterobacter and [name]. coli. I did not change the doxycycline I put him on empirically although neither 1 of these were plated to doxycycline. He arrives today with the erythema looking worse on both the dorsal and plantar foot. Macerated skin on the bottom of the foot. he has not been systemically unwell 5/18-Patient returns at 1 week, left calf wound appears to be making some progress, left buttock wound appears slightly worse than last time, left foot wound looks slightly better, right foot redness is marginally better. X-ray of both feet show no air or evidence of osteomyelitis. Patient is finished his Omnicef and terbinafine. He continues to have macerated skin on the bottom of the left foot as well as right 5/26; left calf wound is better, left buttock wound appears to have multiple small superficial open areas with surrounding macerated skin. X-rays that I did last time showed no evidence of osteomyelitis in either foot. He is finished cefdinir and doxycycline. I do not think that he was on terbinafine. He continues to have [name] large superficial open area on the right foot anterior dorsal and slightly between the first and second toes. I did send him to dermatology 2 months ago or so wondering about whether they would do [name] fungal scraping. I do not believe they did but did do [name] culture. We have been using silver alginate to the toe areas, he has been using antifungals at home topically either ketoconazole or Lotrisone. We are using silver collagen on the left foot, silver alginate on the right, silver collagen on the left lateral leg and silver alginate on the left buttock 6/1; left buttock area is healed. We have the left dorsal foot, left lateral leg and right dorsal foot. We are using silver alginate to the areas on both feet and silver collagen to the area on his left  lateral calf 6/8; the left buttock apparently reopened late last week. He is not really sure how this happened. He is tolerating the terbinafine. Using silver alginate to all wounds 6/15; left buttock wound is larger than last week but  still superficial. Came in the clinic today with [name] report of purulence from the left lateral leg I did not identify any infection Both areas on his dorsal feet appear to be better. He is tolerating the terbinafine. Using silver alginate to all wounds 6/22; left buttock is about the same this week, left calf quite [name] bit better. His left foot is about the same however he comes in with erythema and warmth in the right forefoot once again. Culture that I gave him in the beginning of May showed Enterobacter and [name]. coli. I gave him doxycycline and things seem to improve although neither 1 of these organisms was specifically plated. 6/29; left buttock is larger and dry this week. Left lateral calf looks to me to be improved. Left dorsal foot also somewhat improved right foot completely unchanged. The erythema on the right foot is still present. He is completing the Ceftin dinner that I gave him empirically [see discussion above.) 7/6 - All wounds look to be stable and perhaps improved, the left buttock wound is slightly smaller, per patient bleeds easily, completed ceftin, the right foot redness is less, he is on terbinafine 7/13; left buttock wound about the same perhaps slightly narrower. Area on the left lateral leg continues to narrow. Left dorsal foot slightly smaller right foot about the same. We are using silver alginate on the right foot and Hydrofera Blue to the areas on the left. Unna boot on the left 2 layer compression on the right 7/20; left buttock wound absolutely the same. Area on lateral leg continues to get better. Left dorsal foot require debridement as did the right no major change in the 7/27; left buttock wound the same size necrotic debris over the  surface. The area on the lateral leg is closed once again. His left foot looks better right foot about the same although there is some involvement now of the posterior first second toe area. He is still on terbinafine which I have given him for [name] month, not certain [name] centimeter major change 06/25/19-All wounds appear to be slightly improved according to report, left buttock wound looks clean, both foot wounds have minimal to no debris the right dorsal foot has minimal slough. We are using Hydrofera Blue to the left and silver alginate to the right foot and ischial wound. 8/10-Wounds all appear to be around the same, the right forefoot distal part has some redness which was not there before, however the wound looks clean and small. Ischial wound looks about the same with no changes 8/17; his wound on the left lateral calf which was his original chronic venous insufficiency wound remains closed. Since I last saw him the areas on the left dorsal foot right dorsal foot generally appear better but require debridement. The area on his left initial tuberosity appears somewhat larger to me perhaps hyper granulated and bleeds very easily. We have been using Hydrofera Blue to the left dorsal foot and silver alginate to everything else 8/24; left lateral calf remains closed. The areas on his dorsal feet on the webspace of the first and second toes bilaterally both look better. The area on the left buttock which is the pressure ulcer stage II slightly smaller. I change the dressing to Hydrofera Blue to all areas 8/31; left lateral calf remains closed. The area on his dorsal feet bilaterally look better. Using Hydrofera Blue. Still requiring debridement on the left foot. No change in the left buttock pressure ulcers however 9/14; left lateral calf remains closed.  Dorsal feet look quite [name] bit better than 2 weeks ago. Flaking dry skin also [name] lot better with the ammonium lactate I gave him 2 weeks ago. The area on the  left buttock is improved. He states that his Roho cushion developed [name] leak and he is getting [name] new one, in the interim he is offloading this vigorously 9/21; left calf remains closed. Left heel which was [name] possible DTI looks better this week. He had macerated tissue around the left dorsal foot right foot looks satisfactory and improved left buttock wound. I changed his dressings to his feet to silver alginate bilaterally. Continuing Hydrofera Blue on the left buttock. 9/28 left calf remains closed. Left heel did not develop anything [possible DTI] dry flaking skin on the left dorsal foot. Right foot looks satisfactory. Improved left buttock wound. We are using silver alginate on his feet Hydrofera Blue on the buttock. I have asked him to go back to the Lotrisone on his feet including the wounds and surrounding areas 10/5; left calf remains closed. The areas on the left and right feet about the same. [name] lot of this is epithelialized however debris over the remaining open areas. He is using Lotrisone and silver alginate. The area on the left buttock using Hydrofera Blue 10/26. Patient has been out for 3 weeks secondary to Covid concerns. He tested negative but I think his wife tested positive. He comes in today with the left foot substantially worse, right foot about the same. Even more concerning he states that the area on his left buttock closed over but then reopened and is considerably deeper in one aspect than it was before [stage III wound] 11/2; left foot really about the same as last week. Quarter sized wound on the dorsal foot just proximal to the first second toes. Surrounding erythema with areas of denuded epithelium. This is not really much different looking. Did not look like cellulitis this time however. Right foot area about the same.. We have been using silver alginate alginate on his toes Left buttock still substantial irritated skin around the wound which I think looks somewhat better.  We have been using Hydrofera Blue here. 11/9; left foot larger than last week and [name] very necrotic surface. Right foot I think is about the same perhaps slightly smaller. Debris around the circumference also addressed. Unfortunately on the left buttock there is been [name] decline. Satellite lesions below the major wound distally and now [name] an additional one posteriorly we have been using Hydrofera Blue but I think this is [name] pressure issue 11/16; left foot ulcer dorsally again [name] very adherent necrotic surface. Right foot is about the same. Not much change in the pressure ulcer on his left buttock. 11/30; left foot ulcer dorsally basically the same as when I saw him 2 weeks ago. Very adherent fibrinous debris on the wound surface. Patient reports [name] lot of drainage as well. 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 We have been using Iodoflex, patient changed back to alginate because of the drainage. Area on his right dorsal foot really looks benign with [name] healthier surface certainly [name] lot better than on the left. Left buttock wounds all improved using Hydrofera Blue 12/7; left dorsal foot again no improvement. Tightly adherent debris. PCR culture I did last week only showed likely skin contaminant. I have gone ahead and done [name] punch biopsy of this which is about the last thing in terms of investigations I can  think to do. He has known venous insufficiency and venous hypertension and this could be the issue here. The area on the right foot is about the same left buttock slightly worse according to our intake nurse secondary to [facility] Blue sticking to the wound 12/14; biopsy of the left foot that I did last time showed changes that could be related to wound healing/chronic stasis dermatitis phenomenon no neoplasm. We have been using silver alginate to both feet. I change the one on the left today to Sorbact and silver alginate to his other 2 wounds 12/28; the  patient arrives with the following problems; Major issue is the dorsal left foot which continues to be [name] larger deeper wound area. Still with [name] completely nonviable surface Paradoxically the area mirror image on the right on the right dorsal foot appears to be getting better. He had some loss of dry denuded skin from the lower part of his original wound on the left lateral calf. Some of this area looked [name] little vulnerable and for this reason we put him in wrap that on this side this week Strebel, Mats [name] ([id number]) [phone].pdf Page 12 of 38 The area on his left buttock is larger. He still has the erythematous circular area which I think is [name] combination of pressure, sweat. This does not look like cellulitis or fungal dermatitis 11/26/2019; -Dorsal left foot large open wound with depth. Still debris over the surface. Using Sorbact The area on the dorsal right foot paradoxically has closed over He has [name] reopening on the left ankle laterally at the base of his original wound that extended up into the calf. This appears clean. The left buttock wound is smaller but with very adherent necrotic debris over the surface. We have been using silver alginate here as well The patient had arterial studies done in 2017. He had biphasic waveforms at the dorsalis pedis and posterior tibial bilaterally. ABI in the left was 1.17. Digit waveforms were dampened. He has slight spasticity in the great toes I do not think [name] TBI would be possible 1/11; the patient comes in today with [name] sizable reopening between the first and second toes on the right. This is not exactly in the same location where we have been treating wounds previously. According to our intake nurse this was actually fairly deep but 0.6 cm. The area on the left dorsal foot looks about the same the surface is somewhat cleaner using Sorbact, his MRI is in 2 days. We have not managed yet to get arterial studies. The new reopening on  the left lateral calf looks somewhat better using alginate. The left buttock wound is about the same using alginate 1/18; the patient had his ARTERIAL studies which were quite normal. ABI in the right at 1.13 with triphasic/biphasic waveforms on the left ABI 1.06 again with triphasic/biphasic waveforms. It would not have been possible to have done [name] toe brachial index because of spasticity. We have been using Sorbac to the left foot alginate to the rest of his wounds on the right foot left lateral calf and left buttock 1/25; arrives in clinic with erythema and swelling of the left forefoot worse over the first MTP area. This extends laterally dorsally and but also posteriorly. Still has an area on the left lateral part of the lower part of his calf wound it is eschared and clearly not closed. Area on the left buttock still with surrounding irritation and erythema. Right foot surface wound dorsally. The area between the  right and first and second toes appears better. 2/1; The left foot wound is about the same. Erythema slightly better I gave him [name] week of doxycycline empirically Right foot wound is more extensive extending between the toes to the plantar surface Left lateral calf really no open surface on the inferior part of his original wound however the entire area still looks vulnerable Absolutely no improvement in the left buttock wound required debridement. 2/8; the left foot is about the same. Erythema is slightly improved I gave him clindamycin last week. Right foot looks better he is using Lotrimin and silver alginate He has [name] breakdown in the left lateral calf. Denuded epithelium which I have removed Left buttock about the same were using Hydrofera Blue 2/15; left foot is about the same there is less surrounding erythema. Surface still has tightly adherent debris which I have debriding however not making any progress Right foot has [name] substantial wound on the medial right second toe  between the first and second webspace. Still an open area on the left lateral calf distal area. Buttock wound is about the same 2/22; left foot is about the same less surrounding erythema. Surface has adherent debris. Polymen Ag Right foot area significant wound between the first and second toes. We have been using silver alginate here Left lateral leg polymen Ag at the base of his original venous insufficiency wound Left buttock some improvement here 3/1; Right foot is deteriorating in the first second toe webspace. Larger and more substantial. We have been using silver alginate. Left dorsal foot about the same markedly adherent surface debris using PolyMem Ag Left lateral calf surface debris using PolyMem AG Left buttock is improved again using PolyMem Ag. He is completing his terbinafine. The erythema in the foot seems better. He has been on this for 2 weeks 3/8; no improvement in any wound area in fact he has [name] small open area on the dorsal midfoot which is new this week. He has not gotten his foot x-rays yet 3/15; his x-rays were both negative for osteomyelitis of both feet. No major change in any of his wounds on the extremities however his buttock wounds are better. We have been using polymen on the buttocks, left lower leg. Iodoflex on the left foot and silver alginate on the right 3/22; arrives in clinic today with the 2 major issues are the improvement in the left dorsal foot wound which for once actually looks healthy with [name] nice healthy wound surface without debridement. Using Iodoflex here. Unfortunately on the left lateral calf which is in the distal part of his original wound he came to the clinic here for there was purulent drainage noted some increased breakdown scattered around the original area and [name] small area proximally. We we are using polymen here will change to silver alginate today. His buttock wound on the left is better and I think the area on the right first second toe  webspace is also improved 3/29; left dorsal foot looks better. Using Iodoflex. Left ankle culture from deterioration last time grew [name]. coli, Enterobacter and Enterococcus. I will give him [name] course of cefdinir although that will not cover Enterococcus. The area on the right foot in the webspace of the first and second toe lateral first toe looks better. The area on his buttock is about healed Vascular appointment is on April 21. This is to look at his venous system vis--vis continued breakdown of the wounds on the left including the left lateral leg and  left dorsal foot he. He has had previous ablations on this side 4/5; the area between the right first and second toes lateral aspect of the first toe looks better. Dorsal aspect of the left first toe on the left foot also improved. Unfortunately the left lateral lower leg is larger and there is [name] second satellite wound superiorly. The usual superficial abrasions on the left buttock overall better but certainly not closed 4/12; the area between the right first and second toes is improved. Dorsal aspect of the left foot also slightly smaller with [name] vibrant healthy looking surface. No real change in the left lateral leg and the left buttock wound is healed He has an unaffordable co-pay for Apligraf. Appointment with vein and vascular with regards to the left leg venous part of the circulation is on 4/21 4/19; we continue to see improvement in all wound areas. Although this is minor. He has his vascular appointment on 4/21. The area on the left buttock has not reopened although right in the center of this area the skin looks somewhat threatened 4/26; the left buttock is unfortunately reopened. In general his left dorsal foot has [name] healthy surface and looks somewhat smaller although it was not measured as such. The area between his first and second toe webspace on the right as [name] small wound against the first toe. The patient saw vascular surgery. The real  question I was asking was about the small saphenous vein on the left. He has previously ablated left greater saphenous vein. Nothing further was commented on on the left. Right greater saphenous vein without reflux at the saphenofemoral junction or proximal thigh there was no indication for ablation of the right greater saphenous vein duplex was negative for DVT bilaterally. They did not think there was anything from [name] vascular surgery point of view that could be offered. They ABIs within normal limits 5/3; only small open area on the left buttock. The area on the left lateral leg which was his original venous reflux is now 2 wounds both which look clean. We are using Iodoflex on the left dorsal foot which looks healthy and smaller. He is down to [name] very tiny area between the right first and second toes, using silver alginate 5/10; all of his wounds appear better. We have much better edema control in 4 layer compression on the left. This may be the factor that is allowing the left foot and left lateral calf to heal. He has external compression garments at home 04/14/20-All of his wounds are progressing well, the left forefoot is practically closed, left ischium appears to be about the same, right toe webspace is also smaller. The left lateral leg is about the same, continue using Hydrofera Blue to this, silver alginate to the ischium, Iodoflex to the toe space on the right 6/7; most of his wounds outside of the left buttock are doing well. The area on the left lateral calf and left dorsal foot are smaller. The area on the right foot in between the first and second toe webspace is barely visible although he still says there is some drainage here is the only reason I did not heal this out. Unfortunately the area on the left buttock almost looks like he has [name] skin tear from tape. He has open wound and then [name] large flap of skin that we are trying to get adherence over an area just next to the remaining  wound 6/21; 2 week follow-up. I believe is been here for  nurse visits. Miraculously the area between his first and second toes on the left dorsal foot is closed over. Still open on the right first second web space. The left lateral calf has 2 open areas. Distally this is more superficial. The proximal area had [name] little more depth and required debridement of adherent necrotic material. His buttock wound is actually larger we have been using silver alginate here 6/28; the patient's area on the left foot remains closed. Still open wet area between the first and second toes on the right and also extending into the plantar Blumstein, Tristian [name] ([id number]) [phone].pdf Page 13 of 38 aspect. We have been using silver alginate in this location. He has 2 areas on the left lower leg part of his original long wounds which I think are better. We have been using Hydrofera Blue here. Hydrofera Blue to the left buttock which is stable 7/12; left foot remains closed. Left ankle is closed. May be [name] small area between his right first and second toes the only truly open area is on the left buttock. We have been using Hydrofera Blue here 7/19; patient arrives with marked deterioration especially in the left foot and ankle. We did not put him in [name] compression wrap on the left last week in fact he wore his juxta lite stockings on either side although he does not have an underlying stocking. He has [name] reopening on the left dorsal foot, left lateral ankle and [name] new area on the right dorsal ankle. More worrisome is the degree of erythema on the left foot extending on the lateral foot into the lateral lower leg on the left 7/26; the patient had erythema and drainage from the lateral left ankle last week. Culture of this grew MRSA resistant to doxycycline and clindamycin which are the 2 antibiotics we usually use with this patient who has multiple antibiotic allergies including linezolid, trimethoprim  sulfamethoxazole. I had give him an empiric doxycycline and he comes in the area certainly looks somewhat better although it is blotchy in his lower leg. He has not been systemically unwell. He has had areas on the left dorsal foot which is [name] reopening, chronic wounds on the left lateral ankle. Both of these I think are secondary to chronic venous insufficiency. The area between his first and second toes is closed as far as I can tell. He had [name] new wrap injury on the right dorsal ankle last week. Finally he has an area on the left buttock. We have been using silver alginate to everything except the left buttock we are using Hydrofera Blue 06/30/20-Patient returns at 1 week, has been given [name] sample dose pack of NUZYRA which is [name] tetracycline derivative [omadacycline], patient has completed those, we have been using silver alginate to almost all the wounds except the left ischium where we are using Hydrofera Blue all of them look better 8/16; since I last saw the patient he has been doing well. The area on the left buttock, left lateral ankle and left foot are all closed today. He has completed the [place] I gave him last time and tolerated this well. He still has open areas on the right dorsal ankle and in the right first second toe area which we are using silver alginate. 8/23; we put him in his bilateral external compression stockings last week as he did not have anything open on either leg except for concerning area between the right first and second toe. He comes in today with an area  on the left dorsal foot slightly more proximal than the original wound, the left lateral foot but this is actually [name] continuation of the area he had on the left lateral ankle from last time. As well he is opened up on the left buttock again. 8/30; comes in today with things looking [name] lot better. The area on the left lower ankle has closed down as has the left foot but with eschar in both areas. The area on the dorsal  right ankle is also epithelialized. Very little remaining of the left buttock wound. We have been using silver alginate on all wound areas 9/13; the area in the first second toe webspace on the right has fully epithelialized. He still has some vulnerable epithelium on the right and the ankle and the dorsal foot. He notes weeping. He is using his juxta lite stocking. On the left again the left dorsal foot is closed left lateral ankle is closed. We went to the juxta lite stocking here as well. Still vulnerable in the left buttock although only 2 small open areas remain here 9/27; 2-week follow-up. We did not look at his left leg but the patient says everything is closed. He is [name] bit disturbed by the amount of edema in his left foot he is using juxta lite stockings but asking about over the toes stockings which would be 30/40, will talk to him next time. According to him there is no open wound on either the left foot or the left ankle/calf He has an open area on the dorsal right calf which I initially point [name] wrap injury. He has superficial remaining wound on the left ischial tuberosity been using silver alginate although he says this sticks to the wound 10/5; we gave him 2-week follow-up but he called yesterday expressing some concerns about his right foot right ankle and the left buttock. He came in early. There is still no open areas on the left leg and that still in his juxta lite stocking 10/11; he only has 1 small area on the left buttock that remains measuring millimeters 1 mm. Still has the same irritated skin in this area. We recommended zinc oxide when this eventually closes and pressure relief is meticulously is he can do this. He still has an area on the dorsal part of his right first through third toes which is [name] bit irritated and still open and on the dorsal ankle near the crease of the ankle. We have been using silver alginate and using his own stocking. He has nothing open on the left leg  or foot 10/25; 2-week follow-up. Not nearly as good on the left buttock as I was hoping. For open areas with 5 looking threatened small. He has the erythematous irritated chronic skin in this area. 1 area on the right dorsal ankle. He reports this area bleeds easily Right dorsal foot just proximal to the base of his toes We have been using silver alginate. 11/8; 2-week follow-up. Left buttock is about the same although I do not think the wounds are in the same location we have been using silver alginate. I have asked him to use zinc oxide on the skin around the wounds. He still has [name] small area on the right dorsal ankle he reports this bleeds easily Right dorsal foot just proximal to the base of the toes does not have anything open although the skin is very dry and scaly He has [name] new opening on the nailbed of the left great toe.  Nothing on the left ankle 11/29; 3-week follow-up. Left buttock has 2 open areas. And washing of these wounds today started bleeding easily. Suggesting very friable tissue. We have been using silver alginate. Right dorsal ankle which I thought was initially [name] wrap injury we have been using silver alginate. Nothing open between the toes that I can see. He states the area on the left dorsal toe nailbed healed after the last visit in 2 or 3 days 12/13; 3-week follow-up. His left buttock now has 3 open areas but the original 2 areas are smaller using polymen here. Surrounding skin looks better. The right dorsal ankle is closed. He has [name] small opening on the right dorsal foot at the level of the third toe. In general the skin looks better here. He is wearing his juxta lite stocking on the left leg says there is nothing open 11/24/2020; 3 weeks follow-up. His left buttock still has the 3 open areas. We have been using polymen but due to lack of response he changed to [facility] area. Surrounding skin is dry erythematous and irritated looking. There is no evidence of infection  either bacterial or fungal however there is loss of surface epithelium He still has very dry skin in his foot causing irritation and erythema on the dorsal part of his toes. This is not responded to prolonged courses of antifungal simply looks dry and irritated 1/24; left buttock area still looks about the same he was unable to find the triad ointment that we had suggested. The area on the right lower leg just above the dorsal ankle has reopened and the areas on the right foot between the first second and second third toes and scaling on the bottom of the foot has been about the same for quite some time now. been using silver alginate to all wound areas 2/7; left buttock wound looked quite good although not much smaller in terms of surface area surrounding skin looks better. Only [name] few dry flaking areas on the right foot in between the first and second toes the skin generally looks better here [ammonium lactate]. Finally the area on the right dorsal ankle is closed 2/21; There is no open area on the right foot even between the right first and second toe. Skin around this area dorsally and plantar aspects look better. He has [name] reopening of the area on the right ankle just above the crease of the ankle dorsally. I continue to think that this is probably friction from spasms may be even this time with his stocking under the compression stockings. Wounds on his left buttock look about the same there [name] couple of areas that have reopened. He has [name] total square area of loss of epithelialization. This does not look like infection it looks like [name] contact dermatitis but I just cannot determine to what 3/14; there is nothing on the right foot between the first and second toes this was carefully inspected under illumination. Some chronic irritation on the dorsal part of his foot from toes 1-3 at the base. Nothing really open here substantially. Still has an area on the right foot/ankle that is actually larger  and hyper granulated. His buttock area on the left is just about closed however he has chronic inflammation with loss of the surface epithelial layer 3/28; 2-week follow-up. In clinic today with [name] new wound on the left anterior mid tibia. Says this happened about 2 weeks ago. He is not really sure how wonders about the spasticity of  his legs at night whether that could have caused this other than that he does not have [name] good idea. He has been using topical antibiotics and silver alginate. The area on his right dorsal ankle seems somewhat better. Finally everything on his left buttock is closed. 4/11; 2-week follow-up. All of his wounds are better except for the area over the ischium and left buttock which have opened up widely again. At least part of this is covered in necrotic fibrinous material another part had rolled nonviable skin. The area on the right ankle, left anterior mid tibia are both [name] lot better. He had no open wounds on either foot including the areas between the first and second toes 4/25; patient presents for 2-week follow-up. He states that the wounds are overall stable. He has no complaints today and states he is using Hydrofera Blue to open wounds. 5/9; have not seen this man in over [name] month. For my memory he has open areas on the left mid tibia and right ankle. T oday he has new open area on the right dorsal foot which we have not had [name] problem with recently. He has the sustained area on the left buttock He is also changed his insurance at the beginning of the year [employer]. We will need prior authorizations for debridement Messinger, Glenden [name] ([id number]) [phone].pdf Page 14 of 38 5/23; patient presents for 2-week follow-up. He has prior authorizations for debridement. He denies any issues in the past 2 weeks with his wound care. He has been using Hydrofera Blue to all the wounds. He does report [name] circular rash to the upper left leg that is new. He  denies acute signs of infection. 6/6; 2-week follow-up. The patient has open wounds on the left buttock which are worse than the last time I saw this about [name] month ago. He also has [name] new area to me on the left anterior mid tibia with some surrounding erythema. The area on the dorsal ankle on the right is closed but I think this will be [name] friction injury every time this area is exposed to either our wraps or his compression stockings caused by unrelenting spasms in this leg. 6/20; 2-week follow-up. The patient has open wounds on the left buttock which is about the same. Using [facility] here. - The left mid tibia has [name] static amount of surrounding erythema. Also [name] raised area in the center. We have been using Hydrofera Blue here. Finally he has broken down in his dorsal right foot extending between the first and second toes and going to the base of the first and second toe webspace. I have previously assumed that this was severe venous hypertension 7/5; 2-week follow-up The left buttock wound actually looks better. We are using Hydrofera Blue. He has extensive skin irritation around this area and I have not really been able to get that any better. I have tried Lotrisone i.[name]. antifungals and steroids. More most recently we have just been using Coloplast really looks about the same. The left mid tibia which was new last week culture to have very resistant staph aureus. Not only methicillin-resistant but doxycycline resistant. The patient has [name] plethora of antibiotic allergies including sulfa, linezolid. I used topical bacitracin on this but he has not started this yet. In addition he has an expanding area of erythema with [name] wound on the dorsal right foot. I did [name] deep tissue culture of this area today 7/12; Left buttock area actually looks better surrounding  skin also looks less irritated. Left mid tibia looks about the same. He is using bacitracin this is not worse Right dorsal foot looks about  the same as well. The left first toe also looks about the same 7/19; left buttock wound continues to improve in terms of open areas Left mid tibia is still concerning amount of swelling he is using bacitracin Dorsal left first toe somewhat smaller Right dorsal foot somewhat smaller 7/25; left buttock wound actually continues to improve Left mid tibia area has less swelling. I gave him all my samples of new Nuzyra. This seems to have helped although the wound is still open it. His abrasion closed by here Left dorsal great toe really no better. Still [name] very nonviable surface Right dorsal foot perhaps some better. We have been using bacitracin and silver nitrate to the areas on his lower legs and Hydrofera Blue to the area on the buttock. 8/16 Disappointed that his left buttock wound is actually more substantial. Apparently during the last nurse visit these were both very small. He has continued irritation to [name] large area of skin on his buttock. I have never been able to totally explain this although I think it some combination of the way he sits, pressure, moisture. He is not incontinent enough to contribute to this. Left dorsal great toe still fibrinous debris on the surface that I have debrided today Large area across the dorsal right toes. The area on the left anterior mid tibia has less swelling. He completed the [place]. This does not look infected although the tissue is still fried 8/30; 2-week follow-up. Left buttock areas not improved. We used Hydrofera Blue on this. Weeping wet with the surrounding erythema that I have not been able to control even with Lotrisone and topical Coloplast Left dorsal great toe looks about the same More substantial area again at the base of his toes on the left which is new this week. Area across the dorsal right toes looks improved The left anterior mid tibia looks like it is trying to close 9/13; 2-week follow-up. Using silver alginate on all of his  wounds. The left dorsal foot does not look any better. He has the area on the dorsal toe and also the areas at the base of all of his toes 1 through 3. On the right foot he has [name] similar pattern in [name] similar area. He has the area on his left mid tibia that looks fairly healthy. Finally the area on his left buttock looks somewhat bette 9/20; culture I did of the left foot which was [name] deep tissue culture last time showed [name]. coli he has erythema around this wound. Still [name] completely necrotic surface. His right dorsal foot looks about the same. He has [name] very friable surface to the left anterior mid tibia. Both buttock wounds look better. We have been using silver alginate to all wounds 10/4; he has completed the cephalexin that I gave him last time for the left foot. He is using topical gentamicin under silver alginate silver alginate being applied to all the wounds. Unfortunately all the wounds look irritated on his dorsal right foot dorsal left foot left mid tibia. I wonder if this could be [name] silver allergy. I am going to change him to [place] on the lower extremity. The skin on the left buttock and left posterior thigh still flaking dry and irritated. This has continued no matter what I have applied topically to this. He has [name] solitary open wound  which by itself does not look too bad however the entire area of surrounding skin does not change no matter what we have applied here 10/18; the area on the left dorsal foot and right dorsal foot both look better. The area on the right extends into the plantar but not between his toes. We have been using silver alginate. He still has [name] rectangular erythematous area around the area on the left tibia. The wound itself is very small. Finally everything on his left buttock looks [name] little larger the skin is erythematous 11/15; patient comes in with the left dorsal and right dorsal foot distally looking somewhat better. Still nonviable surface on the left foot  which required debridement. He still has the area on the left anterior mid tibia although this looks somewhat better. He has [name] new area on the right lateral lower leg just above the ankle. Finally his left buttock looks terrible with multiple superficial open wounds geometric square shaped area of chronic erythema which I have not been able to sort through 11/29; right dorsal foot and left dorsal foot both look somewhat better. No debridement required. He has the fragile area on the left anterior mid tibia this looks and continues to look somewhat better. Right lateral lower leg just above the ankle we identified last time also looks better. In general the area on his left buttock looks improved. We are using Hydrofera Blue to all wound areas 12/13; right dorsal foot looks better. The area on the right lateral leg is healed. Left dorsal foot has 2 open areas both of which require debridement. The fragile area on the left anterior mid tibial looks better. Smaller area on his buttocks. Were using Hydrofera Blue 1/10; patient comes in with everything looking slightly larger and/or worse. This includes his left buttock, reopening of the left mid tibia, larger areas on the left dorsal foot and what looks to be [name] cellulitis on the right dorsal and plantar foot. We have been using Hydrofera Blue on all wounds. 1/17; right dorsal foot distally looks better today. The left foot has 2 open wounds that are about the same surrounding erythema. 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 No malignancy they did not stain for fungus 1/24; his right distal foot is not closed dry and scaly but the wound looks like it is contracting. I did not debride anything here. Problem on his left dorsal foot with expanding erythema. Apparently there were problems last week getting the Elesa Hacker  however it is now available at the [facility] but [name] week later. He is using ketoconazole and Coloplast to the left buttock along with Hydrofera Blue this actually looks quite [name] bit better today. 1/31; right dorsal foot again is dry and scaly but looking to contract. He has been using [name] moisturizer on his feet at my request but he is not sure which 1. The left dorsal foot wounds look about the same there is erythema here that I marked last week however after course of Nuzyra it certainly is not any better but not any worse either. Finally on his left buttock the skin continues to look better he has the original wound but [name] new substantial area towards the gluteal cleft. Almost like [name] skin tear. I used scissors to remove skin and subcutaneous tissue here silver nitrate and direct pressure Allocca, Margarito [name] ([id number]) [id number] Page 15 of  38 2/7; right dorsal foot. This does not look too much different from last week. Some erythema skin dry and scaly. No debridement. Left dorsal great toe again still not much improvement. I did remove flaking dry skin and callus from around the edge. Finally on his left buttock. The skin is somewhat better in the periwound. Surface wounds are superficial somewhat better than last week. 01/26/2022: Is [name] little bit of [name] mystery as to why his wounds fail to respond to treatments and actually seem to get worse. This is my first encounter with this patient. He was previously followed by Dr. Dellia Nims. Based upon my review of the chart, it seems that there is [name] little bit of [name] mystery as to why his wounds do not respond as anticipated to the interventions applied and sometimes even get worse. Biopsies have been performed and he was seen by dermatology in [place], but that did not shed any light on the matter. T oday, his gluteal wound is larger, with substantial drainage, rather malodorous. The food wounds are not terrible, but he has [name] lot of  callus and scaly skin around these. He is currently getting silver alginate on the gluteal wound, with idodoflex to the feet. He is using lotrisone on his legs for the dry, scaly skin. 02/09/2022: There has really been no change to any of his wounds. The gluteal wound less drainage and odor, but remains about the same size, the periwound skin remains oddly scaly. His lower extremity wounds also appear roughly the same size. They continue to accumulate [name] small amount of slough. The periwound on his feet and ankle wounds has dry eschar and loose dead skin. We have been using silver alginate on the gluteal wound and Iodoflex on his feet and ankle wounds. T the periwound around his gluteal lesion and Lotrisone on his feet and legs. o 02/23/2022: The right plantar foot wound is closed. The gluteal site looks small but has continued to produce hypertrophic granulation tissue. The foot wounds all look about the same on the dorsal surface of the right foot; on the left, there is only [name] small open area at the site of where his left great toenail would have been. 03/16/2022: The right ankle wound is healed. The right dorsal foot wound is about the same. The left dorsal foot wound is quite [name] bit smaller and the ischial wound is nearly closed. 03/30/2022: The right ankle wound reopened. Both dorsal foot wounds are quite [name] bit smaller. Unfortunately, he appears to have sheared part of his ischial wound open further, perhaps during [name] transfer. 04/13/2022: The right ankle wound has hypertrophic granulation tissue present. The dorsal foot wounds continue to decrease in size. The ischial wound looks about the same today, no better, no worse. 04/27/2022: The right ankle wound has closed. Unfortunately, it looks like some moisture got underneath the dry skin on both of his dorsal feet and these wounds have expanded in size. The ischial wound remains the same with perhaps [name] little bit more slough accumulation than at our  previous visit. 05/11/2022: The right ankle wound remains closed. There is [name] left anterior tibial wound that is small has patchy openings with accumulated slough. The dorsum of his right foot appears to be nearly healed with just [name] small punctate opening. The plantar surface of his right foot has [name] new opening that looks like he may have picked some skin there. His sacral ulcer has hypertrophic granulation tissue but has some slough accumulation. The  dorsum of his left foot has multiple open areas in [name] fairly ragged distribution. All of these have slough accumulated within them. 06/01/2022: The right ankle and left anterior tibial wound are both closed. Dorsum of his right foot and left foot both look substantially better with just tiny scattered openings The without any slough accumulation. He has sheared open new areas on his left gluteus and ischium. He says that his wheelchair cushion, which is air-filled, has [name] leak and so it keeps deflating. He is awaiting [name] new cushion. 06/15/2022: The right ankle wound has reopened and the fat layer is exposed. Both dorsal feet have just small openings with just [name] little bit of slough and eschar accumulation. The wound on his left gluteus and ischium is larger again today and has [name] foul odor. 06/29/2022: The right ankle wound has hypertrophic granulation tissue buildup. His dorsal foot wounds both look better with just some eschar on the surface. He has [name] new wound on his left lateral ankle. He is not sure how he acquired it but by appearance, it looks that he hit it on something, potentially his wheelchair or bed. The ischial wound is about the same but is cleaner without any significant purulent drainage or odor. He did not understand what the [facility] call was about and therefore he does not have the topical compounded antibiotic. 07/13/2022: The right ankle wound again has hypertrophic granulation tissue, but less so than at his previous visit. The ischial wound  has improved tremendously the use of the [facility] topical antibiotic. No significant change to the left lateral ankle wound; it is fibrotic with slough present. The skin of both of his feet, especially on the right, has [name] yeasty appearance. 08/10/2022: There is again hypertrophic granulation tissue on the right anterior ankle wound. Both feet are about the same. The left lateral ankle wound is [name] little bit desiccated and has some slough buildup. He unfortunately suffered [name] new injury when he was removing his pants and they caught his bandage which caused [name] large skin tear on his left ischium,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 left first and second toes 04/01/16 aggressive debridement done of both wounds. The new wound at the base of between his second and first toes looks stable 04/08/16; continued aggressive debridement of both wounds on the left lower leg. He goes for his venous ablation on Monday. The new wound at the base of his first and second toes dorsally appears stable. 04/15/16; wounds aggressively debridement although the base of this looks considerably better Apligraf #1. He had ablation surgery on Monday I'll need to research these records. We only have approval for four Apligraf's 04/22/16; the patient is here for [name] wound check [Apligraf  last week] intake nurse concerned about erythema around the wounds. Apparently [name] significant degree of drainage. The patient has chronic venous inflammation which I think accounts for most of this however I was  asked to look at this today 04/26/16; the patient came back for check of possible cellulitis in his left foot however the Apligraf dressing was inadvertently removed therefore we elected to prep the wound for [name] second Apligraf. I put him on doxycycline on 6/1 the erythema in the foot 05/03/16 we did not remove the dressing from the superior wound as this is where I put all of his last Apligraf. Surface debridement done with [name] curette of the lower wound which looks very healthy. The area on the left foot also looks quite satisfactory at the dorsal artery at the first and second toes 05/10/16; continue Apligraf to this. Her wound, Hydrafera to the lower wound. He has [name] new area on the right second toe. Left dorsal foot firstoosecond toe also looks improved 05/24/16; wound dimensions must be smaller I was able to use Apligraf to all 3 remaining wound areas. 06/07/16 patient's last Apligraf was 2 weeks ago. He arrives today with the 2 wounds on his lateral left leg joined together. This would have to be seen as [name] negative. He also has [name] small wound in his first and second toe on the left dorsally with quite [name] bit of surrounding erythema in the first second and third toes. This looks to be infected or inflamed, very difficult clinical call. 06/21/16: lateral left leg combined wounds. Adherent surface slough area on the left dorsal foot at roughly the fourth toe looks improved 07/12/16; he now has [name] single linear wound on the lateral left leg. This does not look to be [name] lot changed from when I lost saw this. The area on his dorsal left foot looks considerably better however. 08/02/16; no major change in the substantial area on his left lateral leg since last time. We have been using Hydrofera Blue for [name]  prolonged period of time now. The area on his left foot is also unchanged from last review 07/19/16; the area on his dorsal foot on the left looks considerably smaller. He is beginning to have significant rims of epithelialization on the lateral left leg Carrell, Jailin [name] ([id number]) [id number].pdf Page 22 of 38 wound. This also looks better. 08/05/16; the patient came in for [name] nurse visit today. Apparently the area on his left lateral leg looks better and it was wrapped. However in general discussion the patient noted [name] new area on the dorsal aspect of his right second toe. The exact etiology of this is unclear but likely relates to pressure. 08/09/16 really the area on the left lateral leg did not really look that healthy today perhaps slightly larger and measurements. The area on his dorsal right second toe is improved also the left foot wound looks stable to improved 08/16/16; the area on the last lateral leg did not change any of dimensions. Post debridement with [name] curet the area looked better. Left foot wound improved and the area on the dorsal right second toe is improved 08/23/16; the area on the left lateral leg may be slightly smaller both in terms of length and width. Aggressive debridement with [name] curette afterwards the tissue appears healthier. Left foot wound appears improved in the area on the dorsal right second toe is improved 08/30/16 patient developed [name] fever over the weekend and was seen in an urgent care. Felt to have [name] UTI and put on doxycycline. He has been since changed over the phone to [facility]. After we took off the wrap on his right leg today the leg is swollen warm and erythematous,  probably more likely the source of the fever 09/06/16; have been using collagen to the major left leg wound, silver alginate to the area on his anterior foot/toes 09/13/16; the areas on his anterior foot/toes on both sides appear to be virtually closed. Extensive wound on the  left lateral leg perhaps slightly narrower but each visit still covered an adherent surface slough 09/16/16 patient was in for his usual Thursday nurse visit however the intake nurse noted significant erythema of his dorsal right foot. He is also running [name] low- grade fever and having increasing spasms in the right leg 09/20/16 here for cellulitis involving his right great toes and forefoot. This is [name] lot better. Still requiring debridement on his left lateral leg. Santyl direct says he needs prior authorization. Therefore his wife cannot change this at home 09/30/16; the patient's extensive area on the left lateral calf and ankle perhaps somewhat better. Using Santyl. The area on the left toes is healed and I think the area on his right dorsal foot is healed as well. There is no cellulitis or venous inflammation involving the right leg. He is going to need compression stockings here. 10/07/16; the patient's extensive wound on the left lateral calf and ankle does not measure any differently however there appears to be less adherent surface slough using Santyl and aggressive weekly debridements 10/21/16; no major change in the area on the left lateral calf. Still the same measurement still very difficult to debridement adherent slough and nonviable subcutaneous tissue. This is not really been helped by several weeks of Santyl. Previously for 2 weeks I used Iodoflex for [name] short period. [name] prolonged course of Hydrofera Blue didn't really help. I'[name] not sure why I only used 2 weeks of Iodoflex on this there is no evidence of surrounding infection. He has [name] small area on the right second toe which looks as though it's progressing towards closure 10/28/16; the wounds on his toes appear to be closed. No major change in the left lateral leg wound although the surface looks somewhat better using Iodoflex. He has had previous arterial studies that were normal. He has had reflux studies and is status post ablation  although I don't have any exact notes on which vein was ablated. I'll need to check the surgical record 11/04/16; he's had [name] reopening between the first and second toe on the left and right. No major change in the left lateral leg wound. There is what appears to be cellulitis of the left dorsal foot 11/18/16 the patient was hospitalized initially in [place] and then subsequently transferred to [facility] long and was admitted there from 11/09/16 through 11/12/16. He had developed progressive cellulitis on the right leg in spite of the doxycycline I gave him. I'd spoken to the hospitalist in [place] who was concerned about continuing leukocytosis. CT scan is what I suggested this was done which showed soft tissue swelling without evidence of osteomyelitis or an underlying abscess blood cultures were negative. At [facility] he was treated with vancomycin and Primaxin and then add an infectious disease consult. He was transitioned to Ceftaroline. He has been making progressive improvement. Overall [name] severe cellulitis of the right leg. He is been using silver alginate to her original wound on the left leg. The wounds in his toes on the right are closed there is [name] small open area on the base of the left second toe 11/26/15; the patient's right leg is much better although there is still some edema here this could be  reminiscent from his severe cellulitis likely on top of some degree of lymphedema. His left anterior leg wound has less surface slough as reported by her intake nurse. Small wound at the base of the left second toe 12/02/16; patient's right leg is better and there is no open wound here. His left anterior lateral leg wound continues to have [name] healthy-looking surface. Small wound at the base of the left second toe however there is erythema in the left forefoot which is worrisome 12/16/16; is no open wounds on his right leg. We took measurements for stockings. His left anterior lateral leg wound continues  to have [name] healthy-looking surface. I'[name] not sure where we were with the Apligraf run through his insurance. We have been using Iodoflex. He has [name] thick eschar on the left first second toe interface, I suspect this may be fungal however there is no visible open 12/23/16; no open wound on his right leg. He has 2 small areas left of the linear wound that was remaining last week. We have been using Prisma, I thought I have disclosed this week, we can only look forward to next week 01/03/17; the patient had concerning areas of erythema last week, already on doxycycline for UTI through his primary doctor. The erythema is absolutely no better there is warmth and swelling both medially from the left lateral leg wound and also the dorsal left foot. 01/06/17- Patient is here for follow-up evaluation of his left lateral leg ulcer and bilateral feet ulcers. He is on oral antibiotic therapy, tolerating that. Nursing staff and the patient states that the erythema is improved from Monday. 01/13/17; the predominant left lateral leg wound continues to be problematic. I had put Apligraf on him earlier this month once. However he subsequently developed what appeared to be an intense cellulitis around the left lateral leg wound. I gave him Dalvance I think on 2/12 perhaps 2/13 he continues on cefdinir. The erythema is still present but the warmth and swelling is improved. I am hopeful that the cellulitis part of this control. I wouldn't be surprised if there is an element of venous inflammation as well. 01/17/17. The erythema is present but better in the left leg. His left lateral leg wound still does not have [name] viable surface buttons certain parts of this long thin wound it appears like there has been improvement in dimensions. 01/20/17; the erythema still present but much better in the left leg. I'[name] thinking this is his usual degree of chronic venous inflammation. The wound on the left leg looks somewhat better. Is less  surface slough 01/27/17; erythema is back to the chronic venous inflammation. The wound on the left leg is somewhat better. I am back to the point where I like to try an Apligraf once again 02/10/17; slight improvement in wound dimensions. Apligraf #2. He is completing his doxycycline 02/14/17; patient arrives today having completed doxycycline last Thursday. This was supposed to be [name] nurse visit however once again he hasn't tense erythema from the medial part of his wound extending over the lower leg. Also erythema in his foot this is roughly in the same distribution as last time. He has baseline chronic venous inflammation however this is [name] lot worse than the baseline I have learned to accept the on him is baseline inflammation 02/24/17- patient is here for follow-up evaluation. He is tolerating compression therapy. His voicing no complaints or concerns he is here anticipating an Apligraf 03/03/17; he arrives today with an adherent necrotic surface. I  don't think this is surface is going to be amenable for Apligraf's. The erythema around his wound and on the left dorsal foot has resolved he is off antibiotics 03/10/17; better-looking surface today. I don't think he can tolerate Apligraf's. He tells me he had [name] wound VAC after [name] skin graft years ago to this area and they had difficulty with [name] seal. The erythema continues to be stable around this some degree of chronic venous inflammation but he also has recurrent cellulitis. We have been using Iodoflex 03/17/17; continued improvement in the surface and may be small changes in dimensions. Using Iodoflex which seems the only thing that will control his surface 03/24/17- He is here for follow up evaluation of his LLE lateral ulceration and ulcer to right dorsal foot/toe space. He is voicing no complaints or concerns, He is tolerating compression wrap. 03/31/17 arrives today with [name] much healthier looking wound on the left lower extremity. We have been using  Iodoflex for [name] prolonged period of time which has for the first time prepared and adequate looking wound bed although we have not had much in the way of wound dimension improvement. He also has [name] small wound between the first and second toe on the right 04/07/17; arrives today with [name] healthy-looking wound bed and at least the top 50% of this wound appears to be now her. No debridement was required I have changed him to [facility] last week after prolonged Iodoflex. He did not do well with Apligraf's. We've had [name] re-opening between the first and second toe on the right 04/14/17; arrives today with [name] healthier looking wound bed contractions and the top 50% of this wound and some on the lesser 50%. Wound bed appears healthy. The area between the first and second toe on the right still remains problematic 04/21/17; continued very gradual improvement. Using [facility] 04/28/17; continued very gradual improvement in the left lateral leg venous insufficiency wound. His periwound erythema is very mild. We have been using Hydrofera Blue. Wound is making progress especially in the superior 50% 05/05/17; he continues to have very gradual improvement in the left lateral venous insufficiency wound. Both in terms with an length rings are improving. I Recchia, Tyquavious [name] ([id number]) [phone].pdf Page 23 of 38 debrided this every 2 weeks with #5 curet and we have been using Hydrofera Blue and again making good progress With regards to the wounds between his right first and second toe which I thought might of been tinea pedis he is not making as much progress very dry scaly skin over the area. Also the area at the base of the left first and second toe in [name] similar condition 05/12/17; continued gradual improvement in the refractory left lateral venous insufficiency wound on the left. Dimension smaller. Surface still requiring debridement using Hydrofera Blue 05/19/17; continued gradual  improvement in the refractory left lateral venous ulceration. Careful inspection of the wound bed underlying rumination suggested some degree of epithelialization over the surface no debridement indicated. Continue Hydrofera Blue difficult areas between his toes first and third on the left than first and second on the right. I'[name] going to change to silver alginate from silver collagen. Continue ketoconazole as I suspect underlying tinea pedis 05/26/17; left lateral leg venous insufficiency wound. We've been using Hydrofera Blue. I believe that there is expanding epithelialization over the surface of the wound albeit not coming from the wound circumference. This is [name] bit of an odd situation in which the epithelialization seems to be coming  from the surface of the wound rather than in the exact circumference. There is still small open areas mostly along the lateral margin of the wound. ooHe has unchanged areas between the left first and second and the right first second toes which I been treating for tenia pedis 06/02/17; left lateral leg venous insufficiency wound. We have been using Hydrofera Blue. Somewhat smaller from the wound circumference. The surface of the wound remains [name] bit on it almost epithelialized sedation in appearance. I use an open curette today debridement in the surface of all of this especially the edges ooSmall open wounds remaining on the dorsal right first and second toe interspace and the plantar left first second toe and her face on the left 06/09/17; wound on the left lateral leg continues to be smaller but very gradual and very dry surface using Hydrofera Blue 06/16/17 requires weekly debridements now on the left lateral leg although this continues to contract. I changed to silver collagen last week because of dryness of the wound bed. Using Iodoflex to the areas on his first and second toes/web space bilaterally 06/24/17; patient with history of paraplegia also chronic venous  insufficiency with lymphedema. Has [name] very difficult wound on the left lateral leg. This has been gradually reducing in terms of with but comes in with [name] very dry adherent surface. High switch to silver collagen [name] week or so ago with hydrogel to keep the area moist. This is been refractory to multiple dressing attempts. He also has areas in his first and second toes bilaterally in the anterior and posterior web space. I had been using Iodoflex here after [name] prolonged course of silver alginate with ketoconazole was ineffective [question tinea pedis] 07/14/17; patient arrives today with [name] very difficult adherent material over his left lateral lower leg wound. He also has surrounding erythema and poorly controlled edema. He was switched his Santyl last visit which the nurses are applying once during his doctor visit and once on [name] nurse visit. He was also reduced to 2 layer compression I'[name] not exactly sure of the issue here. 07/21/17; better surface today after 1 week of Iodoflex. Significant cellulitis that we treated last week also better. [Doxycycline] 07/28/17 better surface today with now 2 weeks of Iodoflex. Significant cellulitis treated with doxycycline. He has now completed the doxycycline and he is back to his usual degree of chronic venous inflammation/stasis dermatitis. He reminds me he has had ablations surgery here 08/04/17; continued improvement with Iodoflex to the left lateral leg wound in terms of the surface of the wound although the dimensions are better. 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 Problematic areas on the plantar aspect of the first second toe web space on the left and the dorsal aspect of the first second toe web space on the right. At one point I felt these were probably related to chronic fungal infections in treated him aggressively for this although we have not made any improvement here. 08/11/17; left  lateral leg. Surface continues to improve with the Iodoflex although we are not seeing much improvement in overall wound dimensions. Areas on his plantar left foot and right foot show no improvement. In fact the right foot looks somewhat worse 08/18/17; left lateral leg. We changed to [facility] Blue last week after [name] prolonged course of Iodoflex which helps get the surface better. It appears that the wound with is improved. Continue with difficult areas on the left dorsal  first second and plantar first second on the right 09/01/17; patient arrives in clinic today having had [name] temperature of 103 yesterday. He was seen in the ER and [facility]. The patient was concerned he could have cellulitis again in the right leg however they diagnosed him with [name] UTI and he is now on Keflex. He has [name] history of cellulitis which is been recurrent and difficult but this is been in the left leg, in the past 5 use doxycycline. He does in and out catheterizations at home which are risk factors for UTI 09/08/17; patient will be completing his Keflex this weekend. The erythema on the left leg is considerably better. He has [name] new wound today on the medial part of the right leg small superficial almost looks like [name] skin tear. He has worsening of the area on the right dorsal first and second toe. His major area on the left lateral leg is better. Using Hydrofera Blue on all areas 09/15/17; gradual reduction in width on the long wound in the left lateral leg. No debridement required. He also has wounds on the plantar aspect of his left first second toe web space and on the dorsal aspect of the right first second toe web space. 09/22/17; there continues to be very gradual improvements in the dimensions of the left lateral leg wound. He hasn't round erythematous spot with might be pressure on his wheelchair. There is no evidence obviously of infection no purulence no warmth ooHe has [name] dry scaled area on the plantar aspect of the left  first second toe ooImproved area on the dorsal right first second toe. 09/29/17; left lateral leg wound continues to improve in dimensions mostly with an is still [name] fairly long but increasingly narrow wound. ooHe has [name] dry scaled area on the plantar aspect of his left first second toe web space ooIncreasingly concerning area on the dorsal right first second toe. In fact I am concerned today about possible cellulitis around this wound. The areas extending up his second toe and although there is deformities here almost appears to abut on the nailbed. 10/06/17; left lateral leg wound continues to make very gradual progress. Tissue culture I did from the right first second toe dorsal foot last time grew MRSA and enterococcus which was vancomycin sensitive. This was not sensitive to clindamycin or doxycycline. He is allergic to Zyvox and sulfa we have therefore arrange for him to have dalvance infusion tomorrow. He is had this in the past and tolerated it well 10/20/17; left lateral leg wound continues to make decent progress. This is certainly reduced in terms of with there is advancing epithelialization.ooThe cellulitis in the right foot looks better although he still has [name] deep wound in the dorsal aspect of the first second toe web space. Plantar left first toe web space on the left I think is making some progress 10/27/17; left lateral leg wound continues to make decent progress. Advancing epithelialization.using Hydrofera Blue ooThe right first second toe web space wound is better-looking using silver alginate ooImprovement in the left plantar first second toe web space. Again using silver alginate 11/03/17 left lateral leg wound continues to make decent progress albeit slowly. Using Hydrofera Blue ooThe right per second toe web space continues to be [name] very problematic looking punched out wound. I obtained [name] piece of tissue for deep culture I did extensively treated this for fungus. It is  difficult to imagine that this is [name] pressure area as the patient states other than going  outside he doesn't really wear shoes at home ooThe left plantar first second toe web space looked fairly senescent. Necrotic edges. This required debridement oochange to Hydrofera Blue to all wound areas 11/10/17; left lateral leg wound continues to contract. Using Hydrofera Blue ooOn the right dorsal first second toe web space dorsally. Culture I did of this area last week grew MRSA there is not an easy oral option in this patient was multiple antibiotic allergies or intolerances. This was only [name] rare culture isolate I'[name] therefore going to use Bactroban under silver alginate ooOn the left plantar first second toe web space. Debridement is required here. This is also unchanged 11/17/17; left lateral leg wound continues to contract using Hydrofera Blue this is no longer the major issue. ooThe major concern here is the right first second toe web space. He now has an open area going from dorsally to the plantar aspect. There is now wound on the inner lateral part of the first toe. Not [name] very viable surface on this. There is erythema spreading medially into the forefoot. ooNo major change in the left first second toe plantar wound 11/24/17; left lateral leg wound continues to contract using Hydrofera Blue. Nice improvement today ooThe right first second toe web space all of this looks [name] lot less angry than last week. I have given him clindamycin and topical Bactroban for MRSA and terbinafine for the possibility of underlining tinea pedis that I could not control with ketoconazole. Looks somewhat better ooThe area on the plantar left first second toe web space is weeping with dried debris around the wound 12/01/17; left lateral leg wound continues to contract he Hydrofera Blue. It is becoming thinner in terms of with nevertheless it is making good improvement. ooThe right first second toe web space looks less  angry but still [name] large necrotic-looking wounds starting on the plantar aspect of the right foot extending between the toes and now extensively on the base of the right second toe. I gave him clindamycin and topical Bactroban for MRSA anterior benefiting for the possibility of underlying tinea pedis. Not looking better today ooThe area on the left first/second toe looks better. Debrided of necrotic debris 12/05/17* the patient was worked in urgently today because over the weekend he found blood on his incontinence bad when he woke up. He was found to have an ulcer by his wife who does most of his wound care. He came in today for [place] to look at this. He has not had [name] history of wounds in his buttocks in spite of his paraplegia. 12/08/17; seen in follow-up today at his usual appointment. He was seen earlier this week and found to have [name] new wound on his buttock. We also follow him for wounds on the left lateral leg, left first second toe web space and right first second toe web space 12/15/17; we have been using Hydrofera Blue to the left lateral leg which has improved. The right first second toe web space has also improved. Left first Henner, Kari [name] ([id number]) [phone].pdf Page 24 of 38 second toe web space plantar aspect looks stable. The left buttock has worsened using Santyl. Apparently the buttock has drainage 12/22/17; we have been using Hydrofera Blue to the left lateral leg which continues to improve now 2 small wounds separated by normal skin. He tells [place] he had [name] fever up to 100 yesterday he is prone to UTIs but has not noted anything different. He does in and out catheterizations. The area  between the first and second toes today does not look good necrotic surface covered with what looks to be purulent drainage and erythema extending into the third toe. I had gotten this to something that I thought look better last time however it is not look good today. He also has [name]  necrotic surface over the buttock wound which is expanded. I thought there might be infection under here so I removed [name] lot of the surface with [name] #5 curet though nothing look like it really needed culturing. He is been using Santyl to this area 12/27/17; his original wound on the left lateral leg continues to improve using Hydrofera Blue. I gave him samples of Baxdella although he was unable to take them out of fear for an allergic reaction ["lump in his throat"].the culture I did of the purulent drainage from his second toe last week showed both enterococcus and [name] set Enterobacter I was also concerned about the erythema on the bottom of his foot although paradoxically although this looks somewhat better today. 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 We've been using silver alginate here. They came up today that he sleeps in [name] recliner, I'[name] not sure why but I asked him to stop this 01/03/18; his original wound we've been using Hydrofera Blue is now separated into 2 areas. ooUlcer on his left buttock is better he is off the recliner and sleeping in bed ooFinally both wound areas between his first and second toes also looks some better 01/10/18; his original wound on the left lateral leg is now separated into 2 wounds we've been using Hydrofera Blue ooUlcer on his left buttock has some drainage. There is [name] small probing site going into muscle layer superiorly.using silver alginate -He arrives today with [name] deep tissue injury on the left heel ooThe wound on the dorsal aspect of his first second toe on the left looks [name] lot betterusing silver alginate ketoconazole ooThe area on the first second toe web space on the right also looks [name] lot bette 01/17/18; his original wound on the left lateral leg continues to progress using Hydrofera Blue ooUlcer on his left buttock also is smaller surface healthier except for  [name] small probing site going into the muscle layer superiorly. 2.4 cm of tunneling in this area ooDTI on his left heel we have only been offloading. Looks better than last week no threatened open no evidence of infection oothe wound on the dorsal aspect of the first second toe on the left continues to look like it's regressing we have only been using silver alginate and terbinafine orally ooThe area in the first second toe web space on the right also looks to be [name] lot better using silver alginate and terbinafine I think this was prompted by tinea pedis 01/31/18; the patient was hospitalized in [place] last week apparently for [name] complicated UTI. He was discharged on cefepime he does in and out catheterizations. In the hospital he was discovered [name] I don't mild elevation of AST and ALT and the terbinafine was stopped.predictably the pressure ulcer on s his buttock looks betterusing silver alginate. The area on the left lateral leg also is better using Hydrofera Blue. The area between the first and second toes on the left better. First and second toes on the right still substantial but better. Finally the DTI on the left heel has held together and looks like it's resolving 02/07/18-he  is here in follow-up evaluation for multiple ulcerations. He has new injury to the lateral aspect of the last issue [name] pressure ulcer, he states this is from adhesive removal trauma. He states he has tried multiple adhesive products with no success. All other ulcers appear stable. The left heel DTI is resolving. We will continue with same treatment plan and follow-up next week. 02/14/18; follow-up for multiple areas. ooHe has [name] new area last week on the lateral aspect of his pressure ulcer more over the posterior trochanter. The original pressure ulcer looks quite stable has healthy granulation. 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 Almost fully epithelialized on the original superior area using Hydrofera Blue ooDTI on the left heel has peeled off this week to reveal [name] small superficial wound under denuded skin and subcutaneous tissue ooBoth areas between the first and second toes look better including nothing open on the left 02/21/18; ooThe patient's wounds on his left ischial tuberosity and posterior left greater trochanter actually looked better. He has [name] large area of irritation around the area which I think is contact dermatitis. I am doubtful that this is fungal ooHis original wound on the left lateral calf continues to improve we have been using Hydrofera Blue ooThere is no open area in the left first second toe web space although there is [name] lot of thick callus ooThe DTI on the left heel required debridement today of necrotic surface eschar and subcutaneous tissue using silver alginate ooFinally the area on the right first second toe webspace continues to contract using silver alginate and ketoconazole 02/28/18 ooLeft ischial tuberosity wounds look better using silver alginate. ooOriginal wound on the left calf only has one small open area left using Hydrofera Blue ooDTI on the left heel required debridement mostly removing skin from around this wound surface. Using silver alginate ooThe areas on the right first/second toe web space using silver alginate and ketoconazole 03/08/18 on evaluation today patient appears to be doing decently well as best I can tell in regard to his wounds. This is the first time that I have seen him as he generally is followed by Dr. Dellia Nims. With that being said none of his wounds appear to be infected he does have an area where there is some skin covering what appears to be [name] new wound on the left dorsal surface of his great toe. This is right at the nail bed. With that being said I do believe that debrided away some of the excess skin can be of benefit in  this regard. Otherwise he has been tolerating the dressing changes without complication. 03/14/18; patient arrives today with the multiplicity of wounds that we are following. He has not been systemically unwell ooOriginal wound on the left lateral calf now only has 2 small open areas we've been using Hydrofera Blue which should continue ooThe deep tissue injury on the left heel requires debridement today. We've been using silver alginate ooThe left first second toe and the right first second toe are both are reminiscence what I think was tinea pedis. Apparently some of the callus Surface between the toes was removed last week when it started draining. ooPurulent drainage coming from the wound on the ischial tuberosity on the left. 03/21/18-He is here in follow-up evaluation for multiple wounds. 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 He tolerated debridement. The  only change to last week's recommendations is to discontinue antifungal cream between toes. He will follow-up next week 03/28/18; following up for multiple wounds;Concern this week is streaking redness and swelling in the right foot. He is going to need antibiotics for this. 03/31/18; follow-up for right foot cellulitis. Streaking redness and swelling in the right foot on 03/28/18. 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 I put him on clindamycin 300 mg every 6 and brought him in for [name] quick check. He has an open wound between his first and second toes on the right foot as [name] potential source. 04/04/18; ooRight foot cellulitis is resolving he is completing clindamycin. This is truly good news ooLeft lateral calf wound which is initial wound only has one small open area inferiorly this is close to healing out. He has compression stockings. We will use Hydrofera Blue right down to the epithelialization of this ooNonviable surface on the left  heel which was initially pressure with [name] DTI. We've been using Hydrofera Blue. I'[name] going to switch this back to silver alginate ooLeft first second toe/tinea pedis this looks better using silver alginate ooRight first second toe tinea pedis using silver alginate ooLarge pressure ulcers on theLeft ischial tuberosity. Small wound here Looks better. I am uncertain about the surface over the large wound. Using silver alginate 04/11/18; ooCellulitis in the right foot is resolved ooLeft lateral calf wound which was his original wounds still has 2 tiny open areas remaining this is just about closed ooNonviable surface on the left heel is better but still requires debridement ooLeft first second toe/tinea pedis still open using silver alginate ooRight first second toe wound tinea pedis I asked him to go back to using ketoconazole and silver alginate Seidenberg, Grainger [name] ([id number]) [id number].pdf Page 25 of 38 ooLarge pressure ulcers on the left ischial tuberosity this shear injury here is resolved. Wound is smaller. No evidence of infection using silver alginate 04/18/18; ooPatient arrives with an intense area of cellulitis in the right mid lower calf extending into the right heel area. Bright red and warm. Smaller area on the left anterior leg. He has [name] significant history of MRSA. He will definitely need antibioticsoodoxycycline ooHe now has 2 open areas on the left ischial tuberosity the original large wound and now [name] satellite area which I think was above his initial satellite areas. Not [name] wonderful surface on this satellite area surrounding erythema which looks like pressure related. ooHis left lateral calf wound again his original wound is just about closed ooLeft heel pressure injury still requiring debridement ooLeft first second toe looks [name] lot better using silver alginate ooRight first second toe also using silver alginate and ketoconazole cream also looks  better 04/20/18; the patient was worked in early today out of concerns with his cellulitis on the right leg. I had started him on doxycycline. This was 2 days ago. His wife was concerned about the swelling in the area. Also concerned about the left buttock. He has not been systemically unwell no fever chills. No nausea vomiting or diarrhea 04/25/18; the patient's left buttock wound is continued to deteriorate he is using Hydrofera Blue. He is still completing clindamycin for the cellulitis on the right leg although all of this looks better. 05/02/18 ooLeft buttock wound still with [name] lot of drainage and [name] very tightly adherent fibrinous necrotic surface. He has [name] deeper area superiorly ooThe left lateral calf wound is still closed ooDTI wound on the left heel necrotic surface especially  the circumference using Iodoflex ooAreas between his left first second toe and right first second toe both look better. Dorsally and the right first second toe he had [name] necrotic surface although at smaller. In using silver alginate and ketoconazole. I did [name] culture last week which was [name] deep tissue culture of the reminiscence of the open wound on the right first second toe dorsally. This grew [name] few Acinetobacter and [name] few methicillin-resistant staph aureus. Nevertheless the area actually this week looked better. I didn't feel the need to specifically address this at least in terms of systemic antibiotics. 05/09/18; wounds are measuring larger more drainage per our intake. We are using Santyl covered with alginate on the large superficial buttock wounds, Iodosorb on the left heel, ketoconazole and silver alginate to the dorsal first and second toes bilaterally. 05/16/18; ooThe area on his left buttock better in some aspects although the area superiorly over the ischial tuberosity required an extensive debridement.using Santyl ooLeft heel appears stable. Using Iodoflex ooThe areas between his first and second toes  are not bad however there is spreading erythema up the dorsal aspect of his left foot this looks like cellulitis again. He is insensate the erythema is really very brilliant.o Erysipelas He went to see an allergist days ago because he was itching part of this he had lab work done. This showed [name] white count of 15.1 with 70% neutrophils. Hemoglobin of 11.4 and [name] platelet count of 659,000. Last white count we had in Epic was [name] 2-1/2 years ago which was 25.9 but he was ill at the time. He was able to show me some lab work that was done by his primary physician the pattern is about the same. 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 But prompted me to go ahead and do x-rays of both feet and the pelvis rule out osteomyelitis. He also had [name] comprehensive metabolic panel this was reasonably normal his albumin was 3.7 liver function tests BUN/creatinine all normal 05/23/18; x-rays of both his feet from last week were negative for underlying pulmonary abnormality. The x-ray of his pelvis however showed mild irregularity in the left ischial which may represent some early osteomyelitis. The wound in the left ischial continues to get deeper clearly now exposed muscle. Each week necrotic surface material over this area. Whereas the rest of the wounds do not look so bad. ooThe left ischial wound we have been using Santyl and calcium alginate ooT the left heel surface necrotic debris using Iodoflex o ooThe left lateral leg is still healed ooAreas on the left dorsal foot and the right dorsal foot are about the same. There is some inflammation on the left which might represent contact dermatitis, fungal dermatitis I am doubtful cellulitis although this looks better than last week 05/30/18; CT scan done at Hospital did not show any osteomyelitis or abscess. Suggested the possibility of underlying cellulitis although I don't see [name] lot of evidence of this at the  bedside ooThe wound itself on the left buttock/upper thigh actually looks somewhat better. No debridement ooLeft heel also looks better no debridement continue Iodoflex ooBoth dorsal first second toe spaces appear better using Lotrisone. Left still required debridement 06/06/18; ooIntake reported some purulent looking drainage from the left gluteal wound. Using Santyl and calcium alginate ooLeft heel looks better although still [name] nonviable surface requiring debridement ooThe left dorsal foot first/second webspace actually expanding and somewhat deeper. I may consider doing [name] shave  biopsy of this area ooRight dorsal foot first/second webspace appears stable to improved. Using Lotrisone and silver alginate to both these areas 06/13/18 ooLeft gluteal surface looks better. Now separated in the 2 wounds. No debridement required. Still drainage. We'll continue silver alginate ooLeft heel continues to look better with Iodoflex continue this for at least another week ooOf his dorsal foot wounds the area on the left still has some depth although it looks better than last week. We've been using Lotrisone and silver alginate 06/20/18 ooLeft gluteal continues to look better healthy tissue ooLeft heel continues to look better healthy granulation wound is smaller. He is using Iodoflex and his long as this continues continue the Iodoflex ooDorsal right foot looks better unfortunately dorsal left foot does not. There is swelling and erythema of his forefoot. He had minor trauma to this several days ago but doesn't think this was enough to have caused any tissue injury. Foot looks like cellulitis, we have had this problem before 06/27/18 on evaluation today patient appears to be doing [name] little worse in regard to his foot ulcer. 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 Both of which would really be his only options for treating this infection. In the past he has been given and effusion of Orbactiv. This is done very well for him in the past again it's one time dosing IV antibiotic therapy. Subsequently I do believe this is something we're gonna need to see about doing at this point in time. Currently his other wounds seem to be doing somewhat better in my pinion I'[name] pretty happy in that regard. 07/03/18 on evaluation today patient's wounds actually appear to be doing fairly well. He has been tolerating the dressing changes without complication. All in all he seems to be showing signs of improvement. In regard to the antibiotics he has been dealing with infectious disease since I saw him last week as far as getting this scheduled. In the end [facility]'s [facility] this coming Friday. In the meantime he has been continuing to perform the dressing changes in such as previous. There does not appear to be any evidence of infection worsengin at this time. 07/10/18; ooSince I last saw this man 2 weeks ago things have actually improved. IV antibiotics of resulted in less forefoot erythema although there is still some present. He is not systemically unwell ooLeft buttock wounds o2 now have no depth there is increased epithelialization Using silver alginate ooLeft heel still requires debridement using Iodoflex ooLeft dorsal foot still with [name] sizable wound about the size of [name] border but healthy granulation ooRight dorsal foot still with [name] slitlike area using silver alginate 07/18/18; the patient's cellulitis in the left foot is improved in fact I think it is on its way to resolving. ooLeft buttock wounds o2 both look better although the larger one has hypertension granulation we've been using silver alginate ooLeft heel has some thick circumferential redundant skin over the wound edge which will need to be  removed today we've been using Iodoflex ooLeft dorsal foot is still [name] sizable wound required debridement using silver alginate ooThe right dorsal foot is just about closed only [name] small open area remains here 07/25/18; left foot cellulitis is resolved ooLeft buttock wounds o2 both look better. Hyper-granulation on the major area   ooLeft heel as some debris over the surface but otherwise looks [name] healthier wound. Using silver collagen ooRight dorsal foot is just about closed Rehm, Adonis [name] ([id number]) [phone].pdf Page 26 of 38 07/31/18; arrives with our intake nurse worried about purulent drainage from the buttock. We had hyper-granulation here last week ooHis buttock wounds o2 continue to look better ooLeft heel some debris over the surface but measuring smaller. ooRight dorsal foot unfortunately has openings between the toes ooLeft foot superficial wound looks less aggravated. 08/07/18 ooButtock wounds continue to look better although some of her granulation and the larger medial wound. silver alginate ooLeft heel continues to look [name] lot better.silver collagen ooLeft foot superficial wound looks less stable. Requires debridement. He has [name] new wound superficial area on the foot on the lateral dorsal foot. ooRight foot looks better using silver alginate without Lotrisone 08/14/2018; patient was in the ER last week diagnosed with [name] UTI. He is now on Cefpodoxime and Macrodantin. ooButtock wounds continued to be smaller. Using silver alginate ooLeft heel continues to look better using silver collagen ooLeft foot superficial wound looks as though it is improving ooRight dorsal foot area is just about healed. 08/21/2018; patient is completed his antibiotics for his UTI. ooHe has 2 open areas on the buttocks. There is still not closed although the surface looks satisfactory. Using silver alginate ooLeft heel continues to improve using silver collagen ooThe  bilateral dorsal foot areas which are at the base of his first and second toes/possible tinea pedis are actually stable on the left but worse on the right. The area on the left required debridement of necrotic surface. After debridement I obtained [name] specimen for PCR culture. ooThe right dorsal foot which is been just about healed last week is now reopened 08/28/2018; culture done on the left dorsal foot showed coag negative staph both staph epidermidis and Lugdunensis. I think this is worthwhile initiating systemic treatment. I will use doxycycline given his long list of allergies. The area on the left heel slightly improved but still requiring debridement. ooThe large wound on the buttock is just about closed whereas the smaller one is larger. Using silver alginate in this area 09/04/2018; patient is completing his doxycycline for the left foot although this continues to be [name] very difficult wound area with very adherent necrotic debris. We are using silver alginate to all his wounds right foot left foot and the small wounds on his buttock, silver collagen on the left heel. 09/11/2018; once again this patient has intense erythema and swelling of the left forefoot. Lesser degrees of erythema in the right foot. He has [name] long list of allergies and intolerances. I will reinstitute doxycycline. 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 Using silver alginate ooLeft heel also looks better using silver collagen ooUnfortunately both the areas on his feet look worse. The area on the left first second webspace is now gone through to the plantar part of his foot. The area on the left foot anteriorly is irritated with erythema and swelling in the forefoot. 09/25/2018 ooHis wound on the left plantar heel looks better. Using silver collagen ooThe area on the left buttock 2 small remnant areas. One is closed one is still open. Using silver alginate ooThe  areas between both his first and second toes look worse. This in spite of long-standing antifungal therapy with ketoconazole and silver alginate which should have antifungal activity ooHe has small areas around his  original wound on the left calf one is on the bottom of the original scar tissue and one superiorly both of these are small and superficial but again given wound history in this site this is worrisome 10/02/2018 ooLeft plantar heel continues to gradually contract using silver collagen ooLeft buttock wound is unchanged using silver alginate ooThe areas on his dorsal feet between his first and second toes bilaterally look about the same. 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 Small open area here. We have been using silver alginate 10/09/2018; ooLeft plantar heel continues to look healthy and contract. No debridement is required ooLeft buttock slightly smaller with [name] tape injury wound just below which was new this week ooDorsal feet somewhat improved I have been using clindamycin ooLeft lateral looks lower extremity the actual open area looks worse although [name] lot of this is epithelialized. I am going to change to silver collagen today He has [name] lot more swelling in the right leg although this is not pitting not red and not particularly warm there is [name] lot of spasm in the right leg usually indicative of people with paralysis of some underlying discomfort. We have reviewed his vascular status from 2017 he had [name] left greater saphenous vein ablation. I wonder about referring him back to vascular surgery if the area on the left leg continues to deteriorate. 10/16/2018 in today for follow-up and management of multiple lower extremity ulcers. His left Buttock wound is much lower smaller and almost  closed completely. The wound to the left ankle has began to reopen with Epithelialization and some adherent slough. He has multiple new areas to the left foot and leg. The left dorsal foot without much improvement. Wound present between left great webspace and 2nd toe. Erythema and edema present right leg. Right LE ultrasound obtained on 10/10/18 was negative for DVT . 10/23/2018; ooLeft buttock is closed over. Still dry macerated skin but there is no open wound. I suspect this is chronic pressure/moisture ooLeft lateral calf is quite [name] bit worse than when I saw this last. There is clearly drainage here he has macerated skin into the left plantar heel. We will change the primary dressing to alginate ooLeft dorsal foot has some improvement in overall wound area. Still using clindamycin and silver alginate ooRight dorsal foot about the same as the left using clindamycin and silver alginate ooThe erythema in the right leg has resolved. He is DVT rule out was negative ooLeft heel pressure area required debridement although the wound is smaller and the surface is health 10/26/2018 ooThe patient came back in for his nurse check today predominantly because of the drainage coming out of the left lateral leg with [name] recent reopening of his original wound on the left lateral calf. He comes in today with [name] large amount of surrounding erythema around the wound extending from the calf into the ankle and even in the area on the dorsal foot. He is not systemically unwell. He is not febrile. Nevertheless this looks like cellulitis. We have been using silver alginate to the area. I changed him to [name] regular visit and I am going to prescribe him doxycycline. The rationale here is [name] long list of medication intolerances and [name] history of MRSA. I did not see anything that I thought would provide [name] valuable culture 10/30/2018 ooFollow-up from his  appointment 4 days ago with really an extensive area of cellulitis in  the left calf left lateral ankle and left dorsal foot. I put him on doxycycline. He has [name] long list of medication allergies which are true allergy reactions. 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 In any case he is [name] lot better today. The erythema especially in the anterior and lateral left calf is better. He still has left ankle erythema. He also is complaining about increasing edema in the right leg we have only been using Kerlix Coban and he has been doing the wraps at home. Finally he has [name] spotty rash on the medial part of his upper left calf which looks like folliculitis or perhaps wrap occlusion type injury. Small superficial macules not pustules 11/06/18 patient arrives today with again [name] considerable degree of erythema around the wound on the left lateral calf extending into the dorsal ankle and dorsal foot. This is [name] lot worse than when I saw this last week. He is on doxycycline really with not [name] lot of improvement. He has not been systemically unwell Wounds on the; left heel actually looks improved. Original area on the left foot and proximity to the first and second toes looks about the same. He has superficial areas on the dorsal foot, anterior calf and then the reopening of his original wound on the left lateral calf which looks about the same ooThe only area he has on the right is the dorsal webspace first and second which is smaller. ooHe has [name] large area of dry erythematous skin on the left buttock small open area here. Benison, Vannie [name] ([id number]) [phone].pdf Page 27 of 38 11/13/2018; the patient arrives in much better condition. The erythema around the wound on the left lateral calf is [name] lot better. Not sure whether this was the clindamycin or the TCA and ketoconazole or just in the improvement in edema control [stasis dermatitis]. In any case this is [name] lot better. The area on the  left heel is very small and just about resolved using silver collagen we have been using silver alginate to the areas on his dorsal feet 11/20/2018; his wounds include the left lateral calf, left heel, dorsal aspects of both feet just proximal to the first second webspace. He is stable to slightly improved. I did not think any changes to his dressings were going to be necessary 11/27/2018 he has [name] reopening on the left buttock which is surrounded by what looks like tinea or perhaps some other form of dermatitis. The area on the left dorsal foot has some erythema around it I have marked this area but I am not sure whether this is cellulitis or not. Left heel is not closed. Left calf the reopening is really slightly longer and probably worse 1/13; in general things look better and smaller except for the left dorsal foot. Area on the left heel is just about closed, left buttock looks better only [name] small wound remains in the skin looks better [using Lotrisone] 1/20; the area on the left heel only has [name] few remaining open areas here. Left lateral calf about the same in terms of size, left dorsal foot slightly larger right lateral foot still not closed. The area on the left buttock has no open wound and the surrounding skin looks [name] lot better 1/27; the area on the left heel is closed. Left lateral calf better but still requiring extensive debridements.  The area on his left buttock is closed. He still has the open areas on the left dorsal foot which is slightly smaller in the right foot which is slightly expanded. We have been using Iodoflex on these areas as well 2/3; left heel is closed. Left lateral calf still requiring debridement using Iodoflex there is no open area on his left buttock however he has dry scaly skin over [name] large area of this. Not really responding well to the Lotrisone. Finally the areas on his dorsal feet at the level of the first second webspace are slightly smaller on the right and  about the same on the left. Both of these vigorously debrided with Anasept and gauze 2/10; left heel remains closed he has dry erythematous skin over the left buttock but there is no open wound here. Left lateral leg has come in and with. Still requiring debridement we have been using Iodoflex here. Finally the area on the left dorsal foot and right dorsal foot are really about the same extremely dry callused fissured areas. He does not yet have [name] dermatology appointment 2/17; left heel remains closed. He has [name] new open area on the left buttock. The area on the left lateral calf is bigger longer and still covered in necrotic debris. No major change in his foot areas bilaterally. I am awaiting for [name] dermatologist to look on this. We have been using ketoconazole I do not know that this is been doing any good at all. 2/24; left heel remains closed. The left buttock wound that was new reopening last week looks better. The left lateral calf appears better also although still requires debridement. The major area on his foot is the left first second also requiring debridement. We have been putting Prisma on all wounds. I do not believe that the ketoconazole has done too much good for his feet. He will use Lotrisone I am going to give him [name] 2-week course of terbinafine. We still do not have [name] dermatology appointment 3/2 left heel remains closed however there is skin over bone in this area I pointed this out to him today. The left buttock wound is epithelialized but still does not look completely stable. The area on the left leg required debridement were using silver collagen here. With regards to his feet we changed to Lotrisone last week and silver alginate. 3/9; left heel remains closed. Left buttock remains closed. The area on the right foot is essentially closed. The left foot remains unchanged. Slightly smaller on the left lateral calf. Using silver collagen to both of these areas 3/16-Left heel remains  closed. Area on right foot is closed. Left lateral calf above the lateral malleolus open wound requiring debridement with easy bleeding. Left dorsal wound proximal to first toe also debrided. Left ischial area open new. Patient has been using Prisma with wrapping every 3 days. Dermatology appointment is apparently tomorrow.Patient has completed his terbinafine 2-week course with some apparent improvement according to him, there is still flaking and dry skin in his foot on the left 3/23; area on the right foot is reopened. The area on the left anterior foot is about the same still [name] very necrotic adherent surface. He still has the area on the left leg and reopening is on the left buttock. He apparently saw dermatology although I do not have [name] note. According to the patient who is usually fairly well informed they did not have any good ideas. Put him on oral terbinafine which she is been  on before. 3/30; using silver collagen to all wounds. Apparently his dermatologist put him on doxycycline and rifampin presumably some culture grew staph. I do not have this result. He remains on terbinafine although I have used terbinafine on him before 4/6; patient has had [name] fairly substantial reopening on the right foot between the first and second toes. He is finished his terbinafine and I believe is on doxycycline and rifampin still as prescribed by dermatology. We have been using silver collagen to all his wounds although the patient reports that he thinks silver alginate does better on the wounds on his buttock. 4/13; the area on his left lateral calf about the same size but it did not require debridement. ooLeft dorsal foot just proximal to the webspace between the first and second toes is about the same. Still nonviable surface. I note some superficial bronze discoloration of the dorsal part of his foot ooRight dorsal foot just proximal to the first and second toes also looks about the same. I still think  there may be the same discoloration I noted above on the left ooLeft buttock wound looks about the same 4/20; left lateral calf appears to be gradually contracting using silver collagen. 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 The left dorsal foot wound is debrided of tightly adherent necrotic debris and really cleans up quite nicely. The right area is worse with expansion. I did not debride this it is now over the base of the second toe ooThe area on his left buttock is smaller no debridement is required using silver collagen 5/4; left calf continues to make good progress. ooHe arrives with erythema around the wounds on his dorsal foot which even extends to the plantar aspect. Very concerning for coexistent infection. He is finished the erythromycin I gave him for possible erythrasma this does not seem to have helped. ooThe area on the left foot is about the same base of the dorsal toes ooIs area on the buttock looks improved on the left 5/11; left calf and left buttock continued to make good progress. Left foot is about the same to slightly improved. ooMajor problem is on the right foot. He has not had an x-ray. Deep tissue culture I did last week showed both Enterobacter and [name]. coli. I did not change the doxycycline I put him on empirically although neither 1 of these were plated to doxycycline. He arrives today with the erythema looking worse on both the dorsal and plantar foot. Macerated skin on the bottom of the foot. he has not been systemically unwell 5/18-Patient returns at 1 week, left calf wound appears to be making some progress, left buttock wound appears slightly worse than last time, left foot wound looks slightly better, right foot redness is marginally better. X-ray of both feet show no air or evidence of osteomyelitis. Patient is finished his Omnicef and terbinafine. He continues to have macerated skin on the  bottom of the left foot as well as right 5/26; left calf wound is better, left buttock wound appears to have multiple small superficial open areas with surrounding macerated skin. X-rays that I did last time showed no evidence of osteomyelitis in either foot. He is finished cefdinir and doxycycline. I do not think that he was on terbinafine. He continues to have [name] large superficial open area on the right foot anterior dorsal and slightly between the first and second toes. I did send him to dermatology 2 months ago or so wondering  about whether they would do [name] fungal scraping. I do not believe they did but did do [name] culture. We have been using silver alginate to the toe areas, he has been using antifungals at home topically either ketoconazole or Lotrisone. We are using silver collagen on the left foot, silver alginate on the right, silver collagen on the left lateral leg and silver alginate on the left buttock 6/1; left buttock area is healed. We have the left dorsal foot, left lateral leg and right dorsal foot. We are using silver alginate to the areas on both feet and silver collagen to the area on his left lateral calf 6/8; the left buttock apparently reopened late last week. He is not really sure how this happened. He is tolerating the terbinafine. Using silver alginate to all wounds 6/15; left buttock wound is larger than last week but still superficial. ooCame in the clinic today with [name] report of purulence from the left lateral leg I did not identify any infection ooBoth areas on his dorsal feet appear to be better. He is tolerating the terbinafine. Using silver alginate to all wounds 6/22; left buttock is about the same this week, left calf quite [name] bit better. His left foot is about the same however he comes in with erythema and warmth in the right forefoot once again. Culture that I gave him in the beginning of May showed Enterobacter and [name]. coli. I gave him doxycycline and things seem  to improve although neither 1 of these organisms was specifically plated. 6/29; left buttock is larger and dry this week. Left lateral calf looks to me to be improved. Left dorsal foot also somewhat improved right foot completely unchanged. The erythema on the right foot is still present. He is completing the Ceftin dinner that I gave him empirically [see discussion above.) 7/6 - All wounds look to be stable and perhaps improved, the left buttock wound is slightly smaller, per patient bleeds easily, completed ceftin, the right foot redness is less, he is on terbinafine 7/13; left buttock wound about the same perhaps slightly narrower. Area on the left lateral leg continues to narrow. Left dorsal foot slightly smaller right foot Sinn, Murrell [name] ([id number]) [id number] Page 28 of 38 about the same. We are using silver alginate on the right foot and Hydrofera Blue to the areas on the left. Unna boot on the left 2 layer compression on the right 7/20; left buttock wound absolutely the same. Area on lateral leg continues to get better. Left dorsal foot require debridement as did the right no major change in the 7/27; left buttock wound the same size necrotic debris over the surface. The area on the lateral leg is closed once again. His left foot looks better right foot about the same although there is some involvement now of the posterior first second toe area. He is still on terbinafine which I have given him for [name] month, not certain [name] centimeter major change 06/25/19-All wounds appear to be slightly improved according to report, left buttock wound looks clean, both foot wounds have minimal to no debris the right dorsal foot has minimal slough. We are using Hydrofera Blue to the left and silver alginate to the right foot and ischial wound. 8/10-Wounds all appear to be around the same, the right forefoot distal part has some redness which was not there before, however the wound  looks clean and small. Ischial wound looks about the same with no changes 8/17; his wound on the left lateral  calf which was his original chronic venous insufficiency wound remains closed. Since I last saw him the areas on the left dorsal foot right dorsal foot generally appear better but require debridement. The area on his left initial tuberosity appears somewhat larger to me perhaps hyper granulated and bleeds very easily. We have been using Hydrofera Blue to the left dorsal foot and silver alginate to everything else 8/24; left lateral calf remains closed. The areas on his dorsal feet on the webspace of the first and second toes bilaterally both look better. The area on the left buttock which is the pressure ulcer stage II slightly smaller. I change the dressing to Hydrofera Blue to all areas 8/31; left lateral calf remains closed. The area on his dorsal feet bilaterally look better. Using Hydrofera Blue. Still requiring debridement on the left foot. No change in the left buttock pressure ulcers however 9/14; left lateral calf remains closed. Dorsal feet look quite [name] bit better than 2 weeks ago. Flaking dry skin also [name] lot better with the ammonium lactate I gave him 2 weeks ago. The area on the left buttock is improved. He states that his Roho cushion developed [name] leak and he is getting [name] new one, in the interim he is offloading this vigorously 9/21; left calf remains closed. Left heel which was [name] possible DTI looks better this week. He had macerated tissue around the left dorsal foot right foot looks satisfactory and improved left buttock wound. I changed his dressings to his feet to silver alginate bilaterally. Continuing Hydrofera Blue on the left buttock. 9/28 left calf remains closed. Left heel did not develop anything [possible DTI] dry flaking skin on the left dorsal foot. Right foot looks satisfactory. Improved left buttock wound. We are using silver alginate on his feet Hydrofera Blue on  the buttock. I have asked him to go back to the Lotrisone on his feet including the wounds and surrounding areas 10/5; left calf remains closed. The areas on the left and right feet about the same. [name] lot of this is epithelialized however debris over the remaining open areas. He is using Lotrisone and silver alginate. The area on the left buttock using Hydrofera Blue 10/26. Patient has been out for 3 weeks secondary to Covid concerns. He tested negative but I think his wife tested positive. He comes in today with the left foot substantially worse, right foot about the same. Even more concerning he states that the area on his left buttock closed over but then reopened and is considerably deeper in one aspect than it was before [stage III wound] 11/2; left foot really about the same as last week. Quarter sized wound on the dorsal foot just proximal to the first second toes. Surrounding erythema with areas of denuded epithelium. This is not really much different looking. Did not look like cellulitis this time however. ooRight foot area about the same.. We have been using silver alginate alginate on his toes ooLeft buttock still substantial irritated skin around the wound which I think looks somewhat better. We have been using Hydrofera Blue here. 11/9; left foot larger than last week and [name] very necrotic surface. Right foot I think is about the same perhaps slightly smaller. Debris around the circumference also addressed. Unfortunately on the left buttock there is been [name] decline. Satellite lesions below the major wound distally and now [name] an additional one posteriorly we have been using Hydrofera Blue but I think this is [name] pressure issue 11/16; left foot ulcer  dorsally again [name] very adherent necrotic surface. Right foot is about the same. Not much change in the pressure ulcer on his left buttock. 11/30; left foot ulcer dorsally basically the same as when I saw him 2 weeks ago. Very adherent fibrinous  debris on the wound surface. Patient reports [name] lot of drainage as well. 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 We have been using Iodoflex, patient changed back to alginate because of the drainage. Area on his right dorsal foot really looks benign with [name] healthier surface certainly [name] lot better than on the left. Left buttock wounds all improved using Hydrofera Blue 12/7; left dorsal foot again no improvement. Tightly adherent debris. PCR culture I did last week only showed likely skin contaminant. I have gone ahead and done [name] punch biopsy of this which is about the last thing in terms of investigations I can think to do. He has known venous insufficiency and venous hypertension and this could be the issue here. The area on the right foot is about the same left buttock slightly worse according to our intake nurse secondary to [facility] Blue sticking to the wound 12/14; biopsy of the left foot that I did last time showed changes that could be related to wound healing/chronic stasis dermatitis phenomenon no neoplasm. We have been using silver alginate to both feet. I change the one on the left today to Sorbact and silver alginate to his other 2 wounds 12/28; the patient arrives with the following problems; ooMajor issue is the dorsal left foot which continues to be [name] larger deeper wound area. Still with [name] completely nonviable surface ooParadoxically the area mirror image on the right on the right dorsal foot appears to be getting better. ooHe had some loss of dry denuded skin from the lower part of his original wound on the left lateral calf. Some of this area looked [name] little vulnerable and for this reason we put him in wrap that on this side this week ooThe area on his left buttock is larger. He still has the erythematous circular area which I think is [name] combination of pressure, sweat. This does not look like cellulitis or fungal  dermatitis 11/26/2019; -Dorsal left foot large open wound with depth. Still debris over the surface. Using Sorbact ooThe area on the dorsal right foot paradoxically has closed over [facility] has [name] reopening on the left ankle laterally at the base of his original wound that extended up into the calf. This appears clean. ooThe left buttock wound is smaller but with very adherent necrotic debris over the surface. We have been using silver alginate here as well The patient had arterial studies done in 2017. He had biphasic waveforms at the dorsalis pedis and posterior tibial bilaterally. ABI in the left was 1.17. Digit waveforms were dampened. He has slight spasticity in the great toes I do not think [name] TBI would be possible 1/11; the patient comes in today with [name] sizable reopening between the first and second toes on the right. This is not exactly in the same location where we have been treating wounds previously. According to our intake nurse this was actually fairly deep but 0.6 cm. The area on the left dorsal foot looks about the same the surface is somewhat cleaner using Sorbact, his MRI is in 2 days. We have not managed yet to get arterial studies. The new reopening on the left lateral calf looks somewhat better using alginate. The left buttock  wound is about the same using alginate 1/18; the patient had his ARTERIAL studies which were quite normal. ABI in the right at 1.13 with triphasic/biphasic waveforms on the left ABI 1.06 again with triphasic/biphasic waveforms. It would not have been possible to have done [name] toe brachial index because of spasticity. We have been using Sorbac to the left foot alginate to the rest of his wounds on the right foot left lateral calf and left buttock 1/25; arrives in clinic with erythema and swelling of the left forefoot worse over the first MTP area. This extends laterally dorsally and but also posteriorly. Still has an area on the left lateral part of the lower part  of his calf wound it is eschared and clearly not closed. ooArea on the left buttock still with surrounding irritation and erythema. ooRight foot surface wound dorsally. The area between the right and first and second toes appears better. 2/1; ooThe left foot wound is about the same. Erythema slightly better I gave him [name] week of doxycycline empirically ooRight foot wound is more extensive extending between the toes to the plantar surface ooLeft lateral calf really no open surface on the inferior part of his original wound however the entire area still looks vulnerable ooAbsolutely no improvement in the left buttock wound required debridement. 2/8; the left foot is about the same. Erythema is slightly improved I gave him clindamycin last week. ooRight foot looks better he is using Lotrimin and silver alginate ooHe has [name] breakdown in the left lateral calf. Denuded epithelium which I have removed ooLeft buttock about the same were using Hydrofera Blue 2/15; left foot is about the same there is less surrounding erythema. Surface still has tightly adherent debris which I have debriding however not making any progress Kravitz, Lavone [name] ([id number]) [phone].pdf Page 29 of 38 ooRight foot has [name] substantial wound on the medial right second toe between the first and second webspace. ooStill an open area on the left lateral calf distal area. ooButtock wound is about the same 2/22; left foot is about the same less surrounding erythema. Surface has adherent debris. Polymen Ag Right foot area significant wound between the first and second toes. We have been using silver alginate here Left lateral leg polymen Ag at the base of his original venous insufficiency wound ooLeft buttock some improvement here 3/1; ooRight foot is deteriorating in the first second toe webspace. Larger and more substantial. We have been using silver alginate. ooLeft dorsal foot about the same  markedly adherent surface debris using PolyMem Ag ooLeft lateral calf surface debris using PolyMem AG ooLeft buttock is improved again using PolyMem Ag. ooHe is completing his terbinafine. The erythema in the foot seems better. He has been on this for 2 weeks 3/8; no improvement in any wound area in fact he has [name] small open area on the dorsal midfoot which is new this week. He has not gotten his foot x-rays yet 3/15; his x-rays were both negative for osteomyelitis of both feet. No major change in any of his wounds on the extremities however his buttock wounds are better. We have been using polymen on the buttocks, left lower leg. Iodoflex on the left foot and silver alginate on the right 3/22; arrives in clinic today with the 2 major issues are the improvement in the left dorsal foot wound which for once actually looks healthy with [name] nice healthy wound surface without debridement. Using Iodoflex here. Unfortunately on the left lateral calf which is in the distal  part of his original wound he came to the clinic here for there was purulent drainage noted some increased breakdown scattered around the original area and [name] small area proximally. We we are using polymen here will change to silver alginate today. His buttock wound on the left is better and I think the area on the right first second toe webspace is also improved 3/29; left dorsal foot looks better. Using Iodoflex. Left ankle culture from deterioration last time grew [name]. coli, Enterobacter and Enterococcus. I will give him [name] course of cefdinir although that will not cover Enterococcus. The area on the right foot in the webspace of the first and second toe lateral first toe looks better. The area on his buttock is about healed Vascular appointment is on April 21. This is to look at his venous system vis--vis continued breakdown of the wounds on the left including the left lateral leg and left dorsal foot he. He has had previous ablations on  this side 4/5; the area between the right first and second toes lateral aspect of the first toe looks better. Dorsal aspect of the left first toe on the left foot also improved. Unfortunately the left lateral lower leg is larger and there is [name] second satellite wound superiorly. The usual superficial abrasions on the left buttock overall better but certainly not closed 4/12; the area between the right first and second toes is improved. Dorsal aspect of the left foot also slightly smaller with [name] vibrant healthy looking surface. No real change in the left lateral leg and the left buttock wound is healed He has an unaffordable co-pay for Apligraf. Appointment with vein and vascular with regards to the left leg venous part of the circulation is on 4/21 4/19; we continue to see improvement in all wound areas. Although this is minor. He has his vascular appointment on 4/21. The area on the left buttock has not reopened although right in the center of this area the skin looks somewhat threatened 4/26; the left buttock is unfortunately reopened. In general his left dorsal foot has [name] healthy surface and looks somewhat smaller although it was not measured as such. The area between his first and second toe webspace on the right as [name] small wound against the first toe. The patient saw vascular surgery. The real question I was asking was about the small saphenous vein on the left. He has previously ablated left greater saphenous vein. Nothing further was commented on on the left. Right greater saphenous vein without reflux at the saphenofemoral junction or proximal thigh there was no indication for ablation of the right greater saphenous vein duplex was negative for DVT bilaterally. They did not think there was anything from [name] vascular surgery point of view that could be offered. They ABIs within normal limits 5/3; only small open area on the left buttock. The area on the left lateral leg which was his original  venous reflux is now 2 wounds both which look clean. We are using Iodoflex on the left dorsal foot which looks healthy and smaller. He is down to [name] very tiny area between the right first and second toes, using silver alginate 5/10; all of his wounds appear better. We have much better edema control in 4 layer compression on the left. This may be the factor that is allowing the left foot and left lateral calf to heal. He has external compression garments at home 04/14/20-All of his wounds are progressing well, the left forefoot is practically closed,  left ischium appears to be about the same, right toe webspace is also smaller. The left lateral leg is about the same, continue using Hydrofera Blue to this, silver alginate to the ischium, Iodoflex to the toe space on the right 6/7; most of his wounds outside of the left buttock are doing well. The area on the left lateral calf and left dorsal foot are smaller. The area on the right foot in between the first and second toe webspace is barely visible although he still says there is some drainage here is the only reason I did not heal this out. ooUnfortunately the area on the left buttock almost looks like he has [name] skin tear from tape. He has open wound and then [name] large flap of skin that we are trying to get adherence over an area just next to the remaining wound 6/21; 2 week follow-up. I believe is been here for nurse visits. Miraculously the area between his first and second toes on the left dorsal foot is closed over. Still open on the right first second web space. The left lateral calf has 2 open areas. Distally this is more superficial. The proximal area had [name] little more depth and required debridement of adherent necrotic material. His buttock wound is actually larger we have been using silver alginate here 6/28; the patient's area on the left foot remains closed. Still open wet area between the first and second toes on the right and also extending into  the plantar aspect. We have been using silver alginate in this location. He has 2 areas on the left lower leg part of his original long wounds which I think are better. We have been using Hydrofera Blue here. Hydrofera Blue to the left buttock which is stable 7/12; left foot remains closed. Left ankle is closed. May be [name] small area between his right first and second toes the only truly open area is on the left buttock. We have been using Hydrofera Blue here 7/19; patient arrives with marked deterioration especially in the left foot and ankle. We did not put him in [name] compression wrap on the left last week in fact he wore his juxta lite stockings on either side although he does not have an underlying stocking. He has [name] reopening on the left dorsal foot, left lateral ankle and [name] new area on the right dorsal ankle. More worrisome is the degree of erythema on the left foot extending on the lateral foot into the lateral lower leg on the left 7/26; the patient had erythema and drainage from the lateral left ankle last week. Culture of this grew MRSA resistant to doxycycline and clindamycin which are the 2 antibiotics we usually use with this patient who has multiple antibiotic allergies including linezolid, trimethoprim sulfamethoxazole. I had give him an empiric doxycycline and he comes in the area certainly looks somewhat better although it is blotchy in his lower leg. He has not been systemically unwell. He has had areas on the left dorsal foot which is [name] reopening, chronic wounds on the left lateral ankle. Both of these I think are secondary to chronic venous insufficiency. The area between his first and second toes is closed as far as I can tell. He had [name] new wrap injury on the right dorsal ankle last week. Finally he has an area on the left buttock. We have been using silver alginate to everything except the left buttock we are using Hydrofera Blue 06/30/20-Patient returns at 1 week, has been given  [name]  sample dose pack of NUZYRA which is [name] tetracycline derivative [omadacycline], patient has completed those, we have been using silver alginate to almost all the wounds except the left ischium where we are using Hydrofera Blue all of them look better 8/16; since I last saw the patient he has been doing well. The area on the left buttock, left lateral ankle and left foot are all closed today. He has completed the [place] I gave him last time and tolerated this well. He still has open areas on the right dorsal ankle and in the right first second toe area which we are using silver alginate. 8/23; we put him in his bilateral external compression stockings last week as he did not have anything open on either leg except for concerning area between the right first and second toe. He comes in today with an area on the left dorsal foot slightly more proximal than the original wound, the left lateral foot but this is actually [name] continuation of the area he had on the left lateral ankle from last time. As well he is opened up on the left buttock again. 8/30; comes in today with things looking [name] lot better. The area on the left lower ankle has closed down as has the left foot but with eschar in both areas. The area on the dorsal right ankle is also epithelialized. Very little remaining of the left buttock wound. We have been using silver alginate on all wound areas 9/13; the area in the first second toe webspace on the right has fully epithelialized. He still has some vulnerable epithelium on the right and the ankle and the dorsal foot. He notes weeping. He is using his juxta lite stocking. On the left again the left dorsal foot is closed left lateral ankle is closed. We went to the juxta lite stocking here as well. ooStill vulnerable in the left buttock although only 2 small open areas remain here 9/27; 2-week follow-up. We did not look at his left leg but the patient says everything is closed. He is [name] bit disturbed  by the amount of edema in his left foot he is using juxta lite stockings but asking about over the toes stockings which would be 30/40, will talk to him next time. According to him there is no open wound on either the left foot or the left ankle/calf He has an open area on the dorsal right calf which I initially point [name] wrap injury. He has superficial remaining wound on the left ischial tuberosity been using Sonn, Jasiel [name] ([id number]) [phone].pdf Page 30 of 38 silver alginate although he says this sticks to the wound 10/5; we gave him 2-week follow-up but he called yesterday expressing some concerns about his right foot right ankle and the left buttock. He came in early. There is still no open areas on the left leg and that still in his juxta lite stocking 10/11; he only has 1 small area on the left buttock that remains measuring millimeters 1 mm. Still has the same irritated skin in this area. We recommended zinc oxide when this eventually closes and pressure relief is meticulously is he can do this. He still has an area on the dorsal part of his right first through third toes which is [name] bit irritated and still open and on the dorsal ankle near the crease of the ankle. We have been using silver alginate and using his own stocking. He has nothing open on the left leg or foot  10/25; 2-week follow-up. Not nearly as good on the left buttock as I was hoping. For open areas with 5 looking threatened small. He has the erythematous irritated chronic skin in this area. oo1 area on the right dorsal ankle. He reports this area bleeds easily ooRight dorsal foot just proximal to the base of his toes ooWe have been using silver alginate. 11/8; 2-week follow-up. Left buttock is about the same although I do not think the wounds are in the same location we have been using silver alginate. I have asked him to use zinc oxide on the skin around the wounds. ooHe still has [name] small area  on the right dorsal ankle he reports this bleeds easily ooRight dorsal foot just proximal to the base of the toes does not have anything open although the skin is very dry and scaly ooHe has [name] new opening on the nailbed of the left great toe. Nothing on the left ankle 11/29; 3-week follow-up. Left buttock has 2 open areas. And washing of these wounds today started bleeding easily. Suggesting very friable tissue. We have been using silver alginate. Right dorsal ankle which I thought was initially [name] wrap injury we have been using silver alginate. Nothing open between the toes that I can see. He states the area on the left dorsal toe nailbed healed after the last visit in 2 or 3 days 12/13; 3-week follow-up. His left buttock now has 3 open areas but the original 2 areas are smaller using polymen here. Surrounding skin looks better. The right dorsal ankle is closed. He has [name] small opening on the right dorsal foot at the level of the third toe. In general the skin looks better here. He is wearing his juxta lite stocking on the left leg says there is nothing open 11/24/2020; 3 weeks follow-up. His left buttock still has the 3 open areas. We have been using polymen but due to lack of response he changed to [facility] area. Surrounding skin is dry erythematous and irritated looking. There is no evidence of infection either bacterial or fungal however there is loss of surface epithelium ooHe still has very dry skin in his foot causing irritation and erythema on the dorsal part of his toes. This is not responded to prolonged courses of antifungal simply looks dry and irritated 1/24; left buttock area still looks about the same he was unable to find the triad ointment that we had suggested. The area on the right lower leg just above the dorsal ankle has reopened and the areas on the right foot between the first second and second third toes and scaling on the bottom of the foot has been about the same for  quite some time now. been using silver alginate to all wound areas 2/7; left buttock wound looked quite good although not much smaller in terms of surface area surrounding skin looks better. Only [name] few dry flaking areas on the right foot in between the first and second toes the skin generally looks better here [ammonium lactate]. Finally the area on the right dorsal ankle is closed 2/21; ooThere is no open area on the right foot even between the right first and second toe. Skin around this area dorsally and plantar aspects look better. ooHe has [name] reopening of the area on the right ankle just above the crease of the ankle dorsally. I continue to think that this is probably friction from spasms may be even this time with his stocking under the compression stockings. ooWounds  on his left buttock look about the same there [name] couple of areas that have reopened. He has [name] total square area of loss of epithelialization. This does not look like infection it looks like [name] contact dermatitis but I just cannot determine to what 3/14; there is nothing on the right foot between the first and second toes this was carefully inspected under illumination. Some chronic irritation on the dorsal part of his foot from toes 1-3 at the base. Nothing really open here substantially. Still has an area on the right foot/ankle that is actually larger and hyper granulated. His buttock area on the left is just about closed however he has chronic inflammation with loss of the surface epithelial layer 3/28; 2-week follow-up. In clinic today with [name] new wound on the left anterior mid tibia. Says this happened about 2 weeks ago. He is not really sure how wonders about the spasticity of his legs at night whether that could have caused this other than that he does not have [name] good idea. He has been using topical antibiotics and silver alginate. The area on his right dorsal ankle seems somewhat better. ooFinally everything on his left  buttock is closed. 4/11; 2-week follow-up. All of his wounds are better except for the area over the ischium and left buttock which have opened up widely again. At least part of this is covered in necrotic fibrinous material another part had rolled nonviable skin. The area on the right ankle, left anterior mid tibia are both [name] lot better. He had no open wounds on either foot including the areas between the first and second toes 4/25; patient presents for 2-week follow-up. He states that the wounds are overall stable. He has no complaints today and states he is using Hydrofera Blue to open wounds. 5/9; have not seen this man in over [name] month. For my memory he has open areas on the left mid tibia and right ankle. T oday he has new open area on the right dorsal foot which we have not had [name] problem with recently. He has the sustained area on the left buttock He is also changed his insurance at the beginning of the year [employer]. We will need prior authorizations for debridement 5/23; patient presents for 2-week follow-up. He has prior authorizations for debridement. He denies any issues in the past 2 weeks with his wound care. He has been using Hydrofera Blue to all the wounds. He does report [name] circular rash to the upper left leg that is new. He denies acute signs of infection. 6/6; 2-week follow-up. The patient has open wounds on the left buttock which are worse than the last time I saw this about [name] month ago. He also has [name] new area to me on the left anterior mid tibia with some surrounding erythema. The area on the dorsal ankle on the right is closed but I think this will be [name] friction injury every time this area is exposed to either our wraps or his compression stockings caused by unrelenting spasms in this leg. 6/20; 2-week follow-up. oo The patient has open wounds on the left buttock which is about the same. Using [facility] here. - The left mid tibia has [name] static amount of surrounding  erythema. Also [name] raised area in the center. We have been using Hydrofera Blue here. ooooFinally he has broken down in his dorsal right foot extending between the first and second toes and going to the base of the first and second  toe webspace. I have previously assumed that this was severe venous hypertension 7/5; 2-week follow-up oo The left buttock wound actually looks better. We are using Hydrofera Blue. He has extensive skin irritation around this area and I have not really been able to get that any better. I have tried Lotrisone i.[name]. antifungals and steroids. More most recently we have just been using Coloplast really looks about the same. oo The left mid tibia which was new last week culture to have very resistant staph aureus. Not only methicillin-resistant but doxycycline resistant. The patient has [name] plethora of antibiotic allergies including sulfa, linezolid. I used topical bacitracin on this but he has not started this yet. oo In addition he has an expanding area of erythema with [name] wound on the dorsal right foot. I did [name] deep tissue culture of this area today 7/12; oo Left buttock area actually looks better surrounding skin also looks less irritated. oo Left mid tibia looks about the same. He is using bacitracin this is not worse oo Right dorsal foot looks about the same as well. oo The left first toe also looks about the same 7/19; left buttock wound continues to improve in terms of open areas oo Left mid tibia is still concerning amount of swelling he is using bacitracin oo Dorsal left first toe somewhat smaller oo Right dorsal foot somewhat smaller 7/25; left buttock wound actually continues to improve oo Left mid tibia area has less swelling. I gave him all my samples of new Nuzyra. This seems to have helped although the wound is still open it. His abrasion closed by here oo Left dorsal great toe really no better. Still [name] very nonviable surface oo Right dorsal foot  perhaps some better. oo We have been using bacitracin and silver nitrate to the areas on his lower legs and Hydrofera Blue to the area on the buttock. 8/16 oo Disappointed that his left buttock wound is actually more substantial. Apparently during the last nurse visit these were both very small. He has continued Tonche, Kealii [name] ([id number]) [phone].pdf Page 31 of 38 irritation to [name] large area of skin on his buttock. I have never been able to totally explain this although I think it some combination of the way he sits, pressure, moisture. He is not incontinent enough to contribute to this. oo Left dorsal great toe still fibrinous debris on the surface that I have debrided today oo Large area across the dorsal right toes. oo The area on the left anterior mid tibia has less swelling. He completed the [place]. This does not look infected although the tissue is still fried 8/30; 2-week follow-up. oo Left buttock areas not improved. We used Hydrofera Blue on this. Weeping wet with the surrounding erythema that I have not been able to control even with Lotrisone and topical Coloplast oo Left dorsal great toe looks about the same oo More substantial area again at the base of his toes on the left which is new this week. oo Area across the dorsal right toes looks improved oo The left anterior mid tibia looks like it is trying to close 9/13; 2-week follow-up. Using silver alginate on all of his wounds. The left dorsal foot does not look any better. He has the area on the dorsal toe and also the areas at the base of all of his toes 1 through 3. On the right foot he has [name] similar pattern in [name] similar area. He has the area on his left mid tibia that  looks fairly healthy. Finally the area on his left buttock looks somewhat bette 9/20; culture I did of the left foot which was [name] deep tissue culture last time showed [name]. coli he has erythema around this wound. Still [name] completely  necrotic surface. His right dorsal foot looks about the same. He has [name] very friable surface to the left anterior mid tibia. Both buttock wounds look better. We have been using silver alginate to all wounds 10/4; he has completed the cephalexin that I gave him last time for the left foot. He is using topical gentamicin under silver alginate silver alginate being applied to all the wounds. Unfortunately all the wounds look irritated on his dorsal right foot dorsal left foot left mid tibia. I wonder if this could be [name] silver allergy. I am going to change him to [facility] on the lower extremity. The skin on the left buttock and left posterior thigh still flaking dry and irritated. This has continued no matter what I have applied topically to this. He has [name] solitary open wound which by itself does not look too bad however the entire area of surrounding skin does not change no matter what we have applied here 10/18; the area on the left dorsal foot and right dorsal foot both look better. The area on the right extends into the plantar but not between his toes. We have been using silver alginate. He still has [name] rectangular erythematous area around the area on the left tibia. The wound itself is very small. Finally everything on his left buttock looks [name] little larger the skin is erythematous 11/15; patient comes in with the left dorsal and right dorsal foot distally looking somewhat better. Still nonviable surface on the left foot which required debridement. He still has the area on the left anterior mid tibia although this looks somewhat better. He has [name] new area on the right lateral lower leg just above the ankle. Finally his left buttock looks terrible with multiple superficial open wounds geometric square shaped area of chronic erythema which I have not been able to sort through 11/29; right dorsal foot and left dorsal foot both look somewhat better. No debridement required. He has the fragile area on  the left anterior mid tibia this looks and continues to look somewhat better. Right lateral lower leg just above the ankle we identified last time also looks better. In general the area on his left buttock looks improved. We are using Hydrofera Blue to all wound areas 12/13; right dorsal foot looks better. The area on the right lateral leg is healed. Left dorsal foot has 2 open areas both of which require debridement. The fragile area on the left anterior mid tibial looks better. Smaller area on his buttocks. Were using Hydrofera Blue 1/10; patient comes in with everything looking slightly larger and/or worse. This includes his left buttock, reopening of the left mid tibia, larger areas on the left dorsal foot and what looks to be [name] cellulitis on the right dorsal and plantar foot. We have been using Hydrofera Blue on all wounds. 1/17; right dorsal foot distally looks better today. The left foot has 2 open wounds that are about the same surrounding erythema. 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 No malignancy they did not stain for fungus 1/24; his right distal foot is not closed dry and scaly but the wound looks like it  is contracting. I did not debride anything here. Problem on his left dorsal foot with expanding erythema. Apparently there were problems last week getting the Elesa Hacker however it is now available at the [facility] but [name] week later. He is using ketoconazole and Coloplast to the left buttock along with Hydrofera Blue this actually looks quite [name] bit better today. 1/31; right dorsal foot again is dry and scaly but looking to contract. He has been using [name] moisturizer on his feet at my request but he is not sure which 1. The left dorsal foot wounds look about the same there is erythema here that I marked last week however after course of Nuzyra  it certainly is not any better but not any worse either. Finally on his left buttock the skin continues to look better he has the original wound but [name] new substantial area towards the gluteal cleft. Almost like [name] skin tear. I used scissors to remove skin and subcutaneous tissue here silver nitrate and direct pressure 2/7; right dorsal foot. This does not look too much different from last week. Some erythema skin dry and scaly. No debridement. Left dorsal great toe again still not much improvement. I did remove flaking dry skin and callus from around the edge. Finally on his left buttock. The skin is somewhat better in the periwound. Surface wounds are superficial somewhat better than last week. 01/26/2022: Is [name] little bit of [name] mystery as to why his wounds fail to respond to treatments and actually seem to get worse. This is my first encounter with this patient. He was previously followed by Dr. Dellia Nims. Based upon my review of the chart, it seems that there is [name] little bit of [name] mystery as to why his wounds do not respond as anticipated to the interventions applied and sometimes even get worse. Biopsies have been performed and he was seen by dermatology in [place], but that did not shed any light on the matter. T oday, his gluteal wound is larger, with substantial drainage, rather malodorous. The food wounds are not terrible, but he has [name] lot of callus and scaly skin around these. He is currently getting silver alginate on the gluteal wound, with idodoflex to the feet. He is using lotrisone on his legs for the dry, scaly skin. 02/09/2022: There has really been no change to any of his wounds. The gluteal wound less drainage and odor, but remains about the same size, the periwound skin remains oddly scaly. His lower extremity wounds also appear roughly the same size. They continue to accumulate [name] small amount of slough. The periwound on his feet and ankle wounds has dry eschar and loose dead skin. We have  been using silver alginate on the gluteal wound and Iodoflex on his feet and ankle wounds. T the periwound around his gluteal lesion and Lotrisone on his feet and legs. o 02/23/2022: The right plantar foot wound is closed. The gluteal site looks small but has continued to produce hypertrophic granulation tissue. The foot wounds all look about the same on the dorsal surface of the right foot; on the left, there is only [name] small open area at the site of where his left great toenail would have been. 03/16/2022: The right ankle wound is healed. The right dorsal foot wound is about the same. The left dorsal foot wound is quite [name] bit smaller and the ischial wound is nearly closed. 03/30/2022: The right ankle wound reopened. Both dorsal foot wounds are quite [name] bit smaller.  Unfortunately, he appears to have sheared part of his ischial wound open further, perhaps during [name] transfer. 04/13/2022: The right ankle wound has hypertrophic granulation tissue present. The dorsal foot wounds continue to decrease in size. The ischial wound looks about the same today, no better, no worse. 04/27/2022: The right ankle wound has closed. Unfortunately, it looks like some moisture got underneath the dry skin on both of his dorsal feet and these wounds have expanded in size. The ischial wound remains the same with perhaps [name] little bit more slough accumulation than at our previous visit. 05/11/2022: The right ankle wound remains closed. There is [name] left anterior tibial wound that is small has patchy openings with accumulated slough. The dorsum of his right foot appears to be nearly healed with just [name] small punctate opening. The plantar surface of his right foot has [name] new opening that looks like he may Bergeron, Lerry [name] ([id number]) [phone].pdf Page 32 of 38 have picked some skin there. His sacral ulcer has hypertrophic granulation tissue but has some slough accumulation. The dorsum of his left foot has  multiple open areas in [name] fairly ragged distribution. All of these have slough accumulated within them. 06/01/2022: The right ankle and left anterior tibial wound are both closed. Dorsum of his right foot and left foot both look substantially better with just tiny scattered openings The without any slough accumulation. He has sheared open new areas on his left gluteus and ischium. He says that his wheelchair cushion, which is air-filled, has [name] leak and so it keeps deflating. He is awaiting [name] new cushion. 06/15/2022: The right ankle wound has reopened and the fat layer is exposed. Both dorsal feet have just small openings with just [name] little bit of slough and eschar accumulation. The wound on his left gluteus and ischium is larger again today and has [name] foul odor. 06/29/2022: The right ankle wound has hypertrophic granulation tissue buildup. His dorsal foot wounds both look better with just some eschar on the surface. He has [name] new wound on his left lateral ankle. He is not sure how he acquired it but by appearance, it looks that he hit it on something, potentially his wheelchair or bed. The ischial wound is about the same but is cleaner without any significant purulent drainage or odor. He did not understand what the [facility] call was about and therefore he does not have the topical compounded antibiotic. 07/13/2022: The right ankle wound again has hypertrophic granulation tissue, but less so than at his previous visit. The ischial wound has improved tremendously the use of the [facility] topical antibiotic. No significant change to the left lateral ankle wound; it is fibrotic with slough present. The skin of both of his feet, especially on the right, has [name] yeasty appearance. 08/10/2022: There is again hypertrophic granulation tissue on the right anterior ankle wound. Both feet are about the same. The left lateral ankle wound is [name] little bit desiccated and has some slough buildup. He unfortunately suffered [name] new  injury when he was removing his pants and they caught his bandage which caused [name] large skin tear on his left ischium, just distal to the existing wound. The existing left ischial wound, however, is significantly better with just [name] little light slough on the surface. 08/31/2022: The right anterior ankle wound is [name] lot smaller today underneath some eschar. No accumulation of hypertrophic granulation tissue. The left lateral ankle wound has some slough on the surface but is better in terms of moisture  balance this week. The left dorsal foot does not really have any openings on it today. The right dorsal foot has some slough and eschar accumulation. His gluteal ulcer is basically closed aside from 2 small areas that are oozing [name] bit. 09/21/2022: The right anterior ankle wound is down to just [name] tiny pinhole. The left lateral ankle wound has accumulated slough again, but moisture balance is good. Left and right dorsal feet both have 2 small openings with some slough in them. The gluteal ulcer, unfortunately has opened up substantially. 10/05/2022: The right anterior ankle wound has reopened and has [name] fair amount of slough on the surface. The left lateral ankle wound has accumulated more slough, as well. He was approved for Apligraf, but the wound is not clean enough yet. There is some slough buildup on the dorsal right foot wound, as well as on his ischium. 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 10/26/2022: The right anterior ankle wound has closed again. The left lateral ankle wound looks quite [name] bit better. It is ready for Apligraf but we do not have 1 here for him. His ischium continues to get worse, with the wound larger and deeper. It really seems like this is [name] combination of pressure and friction. He has 2 new wounds, 1 on the plantar aspect of each foot. He says that he had some dry skin there that peeled away. Both are superficial. The  dorsum of his left foot does not have any new wound opening. The dorsum of his right foot has [name] bit of slough accumulation. 11/24/2022: The right anterior ankle wound has 2 small open areas, both covered with eschar. The left lateral ankle wound has closed and considerably with just [name] little slough on the surface. The ischium has improved most significantly, having closed in most of the open area with just [name] few small remaining superficial openings. The dorsal foot wounds are small and superficial. The bottoms of his feet are closed. 12/07/2022: The right anterior ankle wound remains open with 2 small areas, again with slough and eschar present. The left lateral ankle wound is down to about half [name] centimeter with just some slough on the surface. His right dorsal foot has opened up more and has both slough and eschar present. The ischium has continued to improve and now just has 2 small open areas that are very clean. 12/21/2022: He has [name] new wound on his right lateral ankle. There is some slough present. The left lateral ankle is quite [name] bit smaller with some slough buildup. His left ischial ulcer is also nearly closed; there is just [name] tear in his skin that seems likely to be secondary to [name] transfer mishap. He has [name] new ulcer in his natal cleft that looks like it may be secondary to moisture. There is some slough in this area as well. The right anterior ankle is nearly closed. The right dorsal foot wound has some slough accumulation Patient History Information obtained from Patient. Family History Diabetes - Father, Hypertension - Mother, No family history of Cancer, Heart Disease, Hereditary Spherocytosis, Kidney Disease, Lung Disease, Stroke, Thyroid Problems, Tuberculosis. Social History Former smoker, Marital Status - Married, Alcohol Use - Moderate, Drug Use - No History, Caffeine Use - Daily. Medical History Ear/Nose/Mouth/Throat Denies history of Chronic sinus problems/congestion, Middle ear  problems Hematologic/Lymphatic Denies history of Anemia, Hemophilia, Human Immunodeficiency Virus, Lymphedema, Sickle Cell Disease Respiratory Patient has  history of Sleep Apnea - does not tolerate cpap Denies history of Aspiration, Asthma, Chronic Obstructive Pulmonary Disease (COPD), Pneumothorax, Tuberculosis Cardiovascular Patient has history of Hypertension - lisinopril HCTZ Denies history of Angina, Arrhythmia, Congestive Heart Failure, Coronary Artery Disease, Deep Vein Thrombosis, Hypotension, Myocardial Infarction, Peripheral Arterial Disease, Peripheral Venous Disease, Phlebitis, Vasculitis Gastrointestinal Denies history of Cirrhosis , Colitis, Crohnoos, Hepatitis [name], Hepatitis B, Hepatitis C Endocrine Denies history of Type I Diabetes, Type II Diabetes Genitourinary Denies history of End Stage Renal Disease Immunological Denies history of Lupus Erythematosus, Raynaudoos, Scleroderma Integumentary (Skin) Denies history of History of Burn Musculoskeletal Denies history of Gout, Rheumatoid Arthritis, Osteoarthritis, Osteomyelitis Neurologic Patient has history of Paraplegia Denies history of Dementia, Neuropathy, Quadriplegia, Seizure Disorder Oncologic Delany, Maya [name] ([id number]) [id number] Page 61 of 38 Denies history of Received Chemotherapy, Received Radiation Psychiatric Denies history of Anorexia/bulimia, Confinement Anxiety Hospitalization/Surgery History - cellulitis in leg. - left leg vein ablation. Objective Constitutional Tachycardic, asymptomatic. no acute distress. Vitals Time Taken: 8:28 AM, Height: 70 in, Weight: 216 lbs, BMI: 31, Temperature: 98.5 F, Pulse: 125 bpm, Respiratory Rate: 18 breaths/min, Blood Pressure: 116/63 mmHg. Respiratory Normal work of breathing on room air. General Notes: 12/21/2022: He has [name] new wound on his right lateral ankle. There is some slough present. The left lateral ankle is quite [name] bit smaller  with some slough buildup. His left ischial ulcer is also nearly closed; there is just [name] tear in his skin that seems likely to be secondary to [name] transfer mishap. He has [name] new ulcer in his natal cleft that looks like it may be secondary to moisture. There is some slough in this area as well. The right anterior ankle is nearly closed. The right dorsal foot wound has some slough accumulation Integumentary (Hair, Skin) Wound #52 status is Open. Original cause of wound was Gradually Appeared. The date acquired was: 03/27/2021. The wound has been in treatment 90 weeks. The wound is located on the Right,Dorsal Foot. The wound measures 1.8cm length x 3.7cm width x 0.1cm depth; 5.231cm^2 area and 0.523cm^3 volume. There is Fat Layer (Subcutaneous Tissue) exposed. There is no tunneling or undermining noted. There is [name] medium amount of serosanguineous drainage noted. The wound margin is distinct with the outline attached to the wound base. There is small (1-33%) red granulation within the wound bed. There is [name] large (67-100%) amount of necrotic tissue within the wound bed including Adherent Slough. The periwound skin appearance had no abnormalities noted for texture. The periwound skin appearance exhibited: Dry/Scaly, Erythema. The surrounding wound skin color is noted with erythema. Erythema is measured at 3 cm. Periwound temperature was noted as No Abnormality. Wound #56 status is Open. Original cause of wound was Gradually Appeared. The date acquired was: 07/11/2021. The wound has been in treatment 74 weeks. The wound is located on the Left,Dorsal Foot. The wound measures 0.3cm length x 0.3cm width x 0.1cm depth; 0.071cm^2 area and 0.007cm^3 volume. There is Fat Layer (Subcutaneous Tissue) exposed. There is no tunneling or undermining noted. There is [name] medium amount of serous drainage noted. The wound margin is flat and intact. There is large (67-100%) red, pink granulation within the wound bed. There is no  necrotic tissue within the wound bed. The periwound skin appearance had no abnormalities noted for texture. The periwound skin appearance exhibited: Dry/Scaly, Erythema. The periwound skin appearance did not exhibit: Maceration. The surrounding wound skin color is noted with erythema. Erythema is measured at 6 cm. Periwound temperature was  noted as No Abnormality. Wound #65 status is Open. Original cause of wound was Pressure Injury. The date acquired was: 05/24/2022. The wound has been in treatment 29 weeks. The wound is located on the Left Upper Leg. The wound measures 0.7cm length x 1cm width x 0.1cm depth; 0.55cm^2 area and 0.055cm^3 volume. There is Fat Layer (Subcutaneous Tissue) exposed. There is no tunneling or undermining noted. There is [name] medium amount of serosanguineous drainage noted. The wound margin is flat and intact. There is large (67-100%) red granulation within the wound bed. There is no necrotic tissue within the wound bed. The periwound skin appearance had no abnormalities noted for color. The periwound skin appearance exhibited: Scarring, Dry/Scaly. The periwound skin appearance did not exhibit: Excoriation, Maceration. Periwound temperature was noted as No Abnormality. Wound #66 status is Open. Original cause of wound was Pressure Injury. The date acquired was: 06/15/2022. The wound has been in treatment 27 weeks. The wound is located on the Right,Anterior Ankle. The wound measures 0.1cm length x 0.1cm width x 0.1cm depth; 0.008cm^2 area and 0.001cm^3 volume. There is Fat Layer (Subcutaneous Tissue) exposed. There is no tunneling or undermining noted. There is [name] small amount of serosanguineous drainage noted. The wound margin is flat and intact. There is no granulation within the wound bed. There is no necrotic tissue within the wound bed. The periwound skin appearance had no abnormalities noted for texture. The periwound skin appearance had no abnormalities noted for color. The  periwound skin appearance exhibited: Dry/Scaly. Periwound temperature was noted as No Abnormality. Wound #67 status is Open. Original cause of wound was Gradually Appeared. The date acquired was: 06/22/2022. The wound has been in treatment 25 weeks. The wound is located on the Left,Lateral Lower Leg. The wound measures 1.4cm length x 0.8cm width x 0.1cm depth; 0.88cm^2 area and 0.088cm^3 volume. There is Fat Layer (Subcutaneous Tissue) exposed. There is no tunneling or undermining noted. There is [name] medium amount of serosanguineous drainage noted. The wound margin is distinct with the outline attached to the wound base. There is large (67-100%) red granulation within the wound bed. There is [name] small (1-33%) amount of necrotic tissue within the wound bed including Adherent Slough. The periwound skin appearance had no abnormalities noted for texture. The periwound skin appearance exhibited: Dry/Scaly, Erythema. The surrounding wound skin color is noted with erythema with red streaks. Periwound temperature was noted as No Abnormality. Wound #71 status is Open. Original cause of wound was Gradually Appeared. The date acquired was: 12/06/2022. The wound is located on the Right,Lateral Lower Leg. The wound measures 0.6cm length x 0.7cm width x 0.1cm depth; 0.33cm^2 area and 0.033cm^3 volume. There is Fat Layer (Subcutaneous Tissue) exposed. There is no tunneling or undermining noted. There is [name] medium amount of serosanguineous drainage noted. The wound margin is flat and intact. There is small (1-33%) red granulation within the wound bed. There is [name] large (67-100%) amount of necrotic tissue within the wound bed including Adherent Slough. The periwound skin appearance had no abnormalities noted for texture. The periwound skin appearance had no abnormalities noted for moisture. The periwound skin appearance had no abnormalities noted for color. Periwound temperature was noted as No Abnormality. Wound #72 status is  Open. Original cause of wound was Pressure Injury. The date acquired was: 12/13/2022. The wound is located on the Right Gluteus. The wound measures 3cm length x 1.7cm width x 0.1cm depth; 4.006cm^2 area and 0.401cm^3 volume. There is Fat Layer (Subcutaneous Tissue) exposed. There is  no tunneling or undermining noted. There is [name] medium amount of serosanguineous drainage noted. The wound margin is flat and intact. There is medium (34- 66%) red granulation within the wound bed. There is [name] medium (34-66%) amount of necrotic tissue within the wound bed including Adherent Slough. The periwound skin appearance had no abnormalities noted for texture. The periwound skin appearance had no abnormalities noted for moisture. The periwound skin appearance had no abnormalities noted for color. 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 There was [name] Selective/Open Wound Non-Viable Tissue Debridement with [name] total area of 6.66 sq cm performed by Fredirick Maudlin, MD. With the following instrument(s): Curette to remove Non-Viable tissue/material. Material removed includes [facility].  No specimens were taken. [name] time out was conducted at 09:15, prior to the start of the procedure. [name] Minimum amount of bleeding was controlled with Pressure. The procedure was tolerated well with [name] pain level of Insensate throughout and [name] pain level of Insensate following the procedure. Post Debridement Measurements: 1.8cm length x 3.7cm width x 0.1cm depth; 0.523cm^3 volume. Character of Wound/Ulcer Post Debridement is improved. Severity of Tissue Post Debridement is: Fat layer exposed. Post procedure Diagnosis Wound #52: Same as Pre-Procedure General Notes: scribed by Baruch Gouty, RN for Dr. Celine Ahr. 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 There was [name] Selective/Open Wound Non-Viable Tissue Debridement with [name] total area of 1.12 sq cm performed by Fredirick Maudlin, MD. With the following instrument(s): Curette to remove Non-Viable tissue/material. Material removed includes [facility]. No specimens were taken. [name] time out was conducted at 09:15, prior to the start of the procedure. [name] Minimum amount of bleeding was controlled with Pressure. The procedure was tolerated well with [name] pain level of Insensate throughout and [name] pain level of Insensate following the procedure. Post Debridement Measurements: 1.4cm length x 0.8cm width x 0.1cm depth; 0.088cm^3 volume. Character of Wound/Ulcer Post Debridement is improved. Severity of Tissue Post Debridement is: Fat layer exposed. Post procedure Diagnosis Wound #67: Same as Pre-Procedure General Notes: scribed by Baruch Gouty, RN for Dr. Celine Ahr. Wound #71 Pre-procedure diagnosis of Wound #71 is [name] Venous Leg Ulcer located on the Right,Lateral Lower Leg .Severity of Tissue Pre Debridement is: Fat layer exposed. There was [name] Selective/Open Wound Non-Viable Tissue Debridement with [name] total area of 0.42 sq cm performed by Fredirick Maudlin, MD.  With the following instrument(s): Curette to remove Non-Viable tissue/material. Material removed includes [facility]. No specimens were taken. [name] time out was conducted at 09:15, prior to the start of the procedure. [name] Minimum amount of bleeding was controlled with Pressure. The procedure was tolerated well with [name] pain level of Insensate throughout and  [name] pain level of Insensate following the procedure. Post Debridement Measurements: 0.6cm length x 0.7cm width x 0.1cm depth; 0.033cm^3 volume. Character of Wound/Ulcer Post Debridement is improved. Severity of Tissue Post Debridement is: Fat layer exposed. Post procedure Diagnosis Wound #71: Same as Pre-Procedure General Notes: scribed by Baruch Gouty, RN for Dr. Celine Ahr. 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 There was [name] Selective/Open Wound Non-Viable Tissue Debridement with [name] total area of 5.1 sq cm performed by Fredirick Maudlin, MD. With the following instrument(s): Curette to remove Non-Viable tissue/material. Material removed includes [facility]. No specimens were taken. [name] time out was conducted at 09:15, prior to the start of the procedure. [name] Minimum amount of bleeding was controlled with Pressure. The procedure was tolerated well with [name] pain level of Insensate throughout and [name] pain level of Insensate following the procedure. Post Debridement Measurements: 3cm length x 1.7cm width x 0.1cm depth; 0.401cm^3 volume. Post debridement Stage noted as Category/Stage III. Character of Wound/Ulcer Post Debridement is improved. Post procedure Diagnosis Wound #72: Same as Pre-Procedure General Notes: scribed by Baruch Gouty, RN for Dr. Celine Ahr. Plan Follow-up Appointments: Return Appointment in 2 weeks. - Dr. Celine Ahr RM 1 Tuesday 2/13 @ 0815 am Bathing/ Shower/ Hygiene: May shower and wash wound with soap and water. - on days that dressing is changed Edema Control - Lymphedema / SCD /  Other: Moisturize legs daily. - using Aquaphor generously to both legs and feet with dressing changes Compression stocking or Garment 30-40 mm/Hg pressure to: - Juxtalite to both legs daily Off-Loading: Roho cushion for wheelchair Turn and reposition every 2 hours WOUND #52: - Foot Wound Laterality: Dorsal, Right Cleanser: Soap and Water Every Other Day/30 Days Discharge Instructions: May shower and wash wound with dial antibacterial soap and water prior to dressing change. Cleanser: Wound Cleanser Every Other Day/30 Days Discharge Instructions: Cleanse the wound with wound cleanser prior to applying [name] clean dressing using gauze sponges, not tissue or cotton balls. Peri-Wound Care: Sween Lotion (Moisturizing lotion) Every Other Day/30 Days Discharge Instructions: Apply Aquaphor moisturizing lotion as directed Prim Dressing: [place] 2x2 (in/in) Every Other Day/30 Days ary Dias, Tripton [name] ([id number]) [id number] Page 35 of 38 Discharge Instructions: Apply to wound bed as instructed Secondary Dressing: Woven Gauze Sponge, Non-Sterile 4x4 in Every Other Day/30 Days Discharge Instructions: Apply over primary dressing as directed. Secured With: [employer], 4.5x3.1 (in/yd) (Generic) Every Other Day/30 Days Discharge Instructions: Secure with Kerlix as directed. Secured With: 82M Medipore H Soft Cloth Surgical T ape, 4 x 10 (in/yd) (Generic) Every Other Day/30 Days Discharge Instructions: Secure with tape as directed. Com pression Stockings: Circaid Juxta Lite Compression Wrap Compression Amount: 30-40 mmHg (right) Discharge Instructions: Apply Circaid Juxta Lite Compression Wrap daily as instructed. Apply first thing in the morning, remove at night before bed. WOUND #56: - Foot Wound Laterality: Dorsal, Left Cleanser: Soap and Water Every Other Day/30 Days Discharge Instructions: May shower and wash wound with dial antibacterial soap and water  prior to dressing change. Cleanser: Wound Cleanser Every Other Day/30 Days Discharge Instructions: Cleanse the wound with wound cleanser prior to applying [name] clean dressing using gauze sponges, not tissue or cotton balls. Peri-Wound Care: Sween Lotion (Moisturizing lotion) Every Other Day/30 Days Discharge Instructions: Apply Aquaphor moisturizing lotion as directed Prim Dressing: [place] 2x2 (in/in) Every Other Day/30 Days ary Discharge Instructions: Apply to wound bed as instructed Secondary Dressing: Woven Gauze Sponge,  Non-Sterile 4x4 in Every Other Day/30 Days Discharge Instructions: Apply over primary dressing as directed. Secured With: [employer], 4.5x3.1 (in/yd) (Generic) Every Other Day/30 Days Discharge Instructions: Secure with Kerlix as directed. Secured With: 11M Medipore H Soft Cloth Surgical T ape, 4 x 10 (in/yd) (Generic) Every Other Day/30 Days Discharge Instructions: Secure with tape as directed. WOUND #65: - Upper Leg Wound Laterality: Left Cleanser: Soap and Water Every Other Day/30 Days Discharge Instructions: May shower and wash wound with dial antibacterial soap and water prior to dressing change. Cleanser: Wound Cleanser Every Other Day/30 Days Discharge Instructions: Cleanse the wound with wound cleanser prior to applying [name] clean dressing using gauze sponges, not tissue or cotton balls. Peri-Wound Care: Zinc Oxide Ointment 30g tube Every Other Day/30 Days Discharge Instructions: Apply Zinc Oxide to periwound with each dressing change Topical: Keystone antibiotic compound Every Other Day/30 Days Discharge Instructions: thin layer to wound bed, use daily once available Prim Dressing: Cutimed Sorbact Swab Every Other Day/30 Days ary Discharge Instructions: Apply to wound bed as instructed Secondary Dressing: Zetuvit Plus Silicone Border Dressing 7x7(in/in) Every Other Day/30 Days Discharge Instructions: Apply silicone border over primary dressing  as directed. WOUND #66: - Ankle Wound Laterality: Right, Anterior Cleanser: Soap and Water Every Other Day/30 Days Discharge Instructions: May shower and wash wound with dial antibacterial soap and water prior to dressing change. Cleanser: Wound Cleanser Every Other Day/30 Days Discharge Instructions: Cleanse the wound with wound cleanser prior to applying [name] clean dressing using gauze sponges, not tissue or cotton balls. Peri-Wound Care: Sween Lotion (Moisturizing lotion) Every Other Day/30 Days Discharge Instructions: Apply Aquaphor moisturizing lotion as directed Prim Dressing: [place] 2x2 (in/in) Every Other Day/30 Days ary Discharge Instructions: Apply to wound bed as instructed Secured With: Kerlix Roll Sterile, 4.5x3.1 (in/yd) (Generic) Every Other Day/30 Days Discharge Instructions: Secure with Kerlix as directed. Secured With: 11M Medipore H Soft Cloth Surgical T ape, 4 x 10 (in/yd) (Generic) Every Other Day/30 Days Discharge Instructions: Secure with tape as directed. WOUND #67: - Lower Leg Wound Laterality: Left, Lateral Cleanser: Soap and Water Every Other Day/30 Days Discharge Instructions: May shower and wash wound with dial antibacterial soap and water prior to dressing change. Cleanser: Wound Cleanser Every Other Day/30 Days Discharge Instructions: Cleanse the wound with wound cleanser prior to applying [name] clean dressing using gauze sponges, not tissue or cotton balls. Peri-Wound Care: Triamcinolone 15 (g) Every Other Day/30 Days Discharge Instructions: Use triamcinolone 15 (g) as directed Peri-Wound Care: Sween Lotion (Moisturizing lotion) Every Other Day/30 Days Discharge Instructions: Apply Aquaphor moisturizing lotion as directed Prim Dressing: [place] 2x2 (in/in) Every Other Day/30 Days ary Discharge Instructions: Apply to wound bed as instructed Secondary Dressing: Woven Gauze Sponge, Non-Sterile 4x4 in Every Other Day/30 Days Discharge  Instructions: Apply over primary dressing as directed. Secured With: [employer], 4.5x3.1 (in/yd) (Generic) Every Other Day/30 Days Discharge Instructions: Secure with Kerlix as directed. Secured With: 11M Medipore H Soft Cloth Surgical T ape, 4 x 10 (in/yd) (Generic) Every Other Day/30 Days Discharge Instructions: Secure with tape as directed. WOUND #71: - Lower Leg Wound Laterality: Right, Lateral Cleanser: Soap and Water Every Other Day/30 Days Discharge Instructions: May shower and wash wound with dial antibacterial soap and water prior to dressing change. Cleanser: Wound Cleanser Every Other Day/30 Days Discharge Instructions: Cleanse the wound with wound cleanser prior to applying [name] clean dressing using gauze sponges, not tissue or cotton balls. Peri-Wound Care: Maude Leriche (  Moisturizing lotion) Every Other Day/30 Days Discharge Instructions: Apply Aquaphor moisturizing lotion as directed Prim Dressing: [place] 2x2 (in/in) Every Other Day/30 Days ary Discharge Instructions: Apply to wound bed as instructed Secondary Dressing: Woven Gauze Sponge, Non-Sterile 4x4 in Every Other Day/30 Days Discharge Instructions: Apply over primary dressing as directed. Secured With: [employer], 4.5x3.1 (in/yd) (Generic) Every Other Day/30 Days Discharge Instructions: Secure with Kerlix as directed. Secured With: 78M Medipore H Soft Cloth Surgical T ape, 4 x 10 (in/yd) (Generic) Every Other Day/30 Days Discharge Instructions: Secure with tape as directed. WOUND #72: - Gluteus Wound Laterality: Right Prim Dressing: [employer] Dressing 2x2 (in/in) Every Other Day/30 Days ary Discharge Instructions: Apply to wound bed as instructed Secondary Dressing: Zetuvit Plus Silicone Border Dressing 4x4 (in/in) Every Other Day/30 Days Discharge Instructions: Apply silicone border over primary dressing as directed. Chesler, Udell [name] ([id number]) [phone].pdf Page  80 of 38 12/21/2022: He has [name] new wound on his right lateral ankle. There is some slough present. The left lateral ankle is quite [name] bit smaller with some slough buildup. His left ischial ulcer is also nearly closed; there is just [name] tear in his skin that seems likely to be secondary to [name] transfer mishap. He has [name] new ulcer in his natal cleft that looks like it may be secondary to moisture. There is some slough in this area as well. The right anterior ankle is nearly closed. The right dorsal foot wound has some slough accumulation I used [name] curette to debride slough from the right lateral ankle, the left lateral ankle, the right dorsal foot, and the new ulcer in his natal cleft. We will continue to use silver alginate to all sites except his left ischial ulcer. We will use his Keystone topical antibiotic compound to this site, along with Sorbact. I am hopeful that this site will be completely healed by his next visit, which will be in 2 weeks. Electronic Signature(s) Signed: 12/21/2022 9:50:28 AM By: Fredirick Maudlin MD FACS Entered By: Fredirick Maudlin on 12/21/2022 09:50:28 -------------------------------------------------------------------------------- HxROS Details Patient Name: Date of Service: Bouchard, [name] 12/21/2022 8:15 [name] Medical Record Number: [id number] Patient Account Number: [id number] Date of Birth/Sex: Treating RN: 19-Aug-1986 (37 y.o. [name]) Primary Care Provider: [place], [place] Other Clinician: Referring Provider: Treating Provider/Extender: Cornelia Copa [name]: 15 Information Obtained From Patient Ear/Nose/Mouth/Throat Medical History: Negative for: Chronic sinus problems/congestion; Middle ear problems Hematologic/Lymphatic Medical History: Negative for: Anemia; Hemophilia; Human Immunodeficiency Virus; Lymphedema; Sickle Cell Disease Respiratory Medical History: Positive for: Sleep Apnea - does not tolerate cpap Negative for: Aspiration;  Asthma; Chronic Obstructive Pulmonary Disease (COPD); Pneumothorax; Tuberculosis Cardiovascular Medical History: Positive for: Hypertension - lisinopril HCTZ Negative for: Angina; Arrhythmia; Congestive Heart Failure; Coronary Artery Disease; Deep Vein Thrombosis; Hypotension; Myocardial Infarction; Peripheral Arterial Disease; Peripheral Venous Disease; Phlebitis; Vasculitis Gastrointestinal Medical History: Negative for: Cirrhosis ; Colitis; Crohns; Hepatitis [name]; Hepatitis B; Hepatitis C Endocrine Medical History: Negative for: Type I Diabetes; Type II Diabetes Genitourinary Medical History: Negative for: End Stage Renal Disease Immunological Medical History: Negative for: Lupus Erythematosus; Raynauds; Scleroderma Laird, Zigmond [name] ([id number]) [id number] Page 37 of 38 Integumentary (Skin) Medical History: Negative for: History of Burn Musculoskeletal Medical History: Negative for: Gout; Rheumatoid Arthritis; Osteoarthritis; Osteomyelitis Neurologic Medical History: Positive for: Paraplegia Negative for: Dementia; Neuropathy; Quadriplegia; Seizure Disorder Oncologic Medical History: Negative for: Received Chemotherapy; Received Radiation Psychiatric Medical History: Negative for: Anorexia/bulimia; Confinement Anxiety Immunizations Pneumococcal Vaccine: Received Pneumococcal Vaccination: No Immunization  Notes: doesn't remember when last tetanus shot was Implantable Devices No devices added Hospitalization / Surgery History Type of Hospitalization/Surgery cellulitis in leg left leg vein ablation Family and Social History Cancer: No; Diabetes: Yes - Father; Heart Disease: No; Hereditary Spherocytosis: No; Hypertension: Yes - Mother; Kidney Disease: No; Lung Disease: No; Stroke: No; Thyroid Problems: No; Tuberculosis: No; Former smoker; Marital Status - Married; Alcohol Use: Moderate; Drug Use: No History; Caffeine Use: Daily; Financial Concerns:  No; Food, Clothing or Shelter Needs: No; Support System Lacking: No; Transportation Concerns: No Electronic Signature(s) Signed: 12/21/2022 10:36:34 AM By: Fredirick Maudlin MD FACS Entered By: Fredirick Maudlin on 12/21/2022 09:48:59 -------------------------------------------------------------------------------- SuperBill Details Patient Name: Date of Service: [place]zo, [name] 12/21/2022 Medical Record Number: [id number] Patient Account Number: [id number] Date of Birth/Sex: Treating RN: [birth year] (37 y.o. 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, stage 3 Drotar, Zai [name] ([id number]) [id number] Page 38 of 38 L97.518 Non-pressure chronic ulcer of other part of right foot with other 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, 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 Non-pressure chronic ulcer of other part of left foot limited to breakdown 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 L98.492 Non-pressure chronic ulcer of skin of other sites with fat layer exposed Facility Procedures : CPT4 Code: 75170017 Description: 49449 - DEBRIDE WOUND 1ST 20 SQ CM OR < ICD-10 Diagnosis Description L98.492 Non-pressure chronic ulcer of skin of other sites with fat layer exposed L97.511 Non-pressure chronic ulcer of other part of right foot limited to breakdown of s  L97.312 Non-pressure chronic ulcer of right 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 L97.322 Non-pressure chronic ulcer of left ankle with fat layer  exposed Modifier: kin Quantity: 1 Physician Procedures : CPT4 Code Description Modifier [id number] - WC PHYS LEVEL 4 - EST PT 25 ICD-10 Diagnosis Description L97.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, stage 3 L97.312  Non-pressure chronic ulcer of right 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 L97.322 Non-pressure chronic ulcer of left 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 Quantity: 1 : [id number] - WC PHYS DEBR WO ANESTH 20 SQ CM ICD-10 Diagnosis Description L98.492 Non-pressure chronic ulcer of skin of other sites with fat layer exposed L97.511 Non-pressure chronic ulcer of other part of right foot limited to breakdown of skin  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 Quantity: 1 Electronic Signature(s) Signed: 12/21/2022 9:51:29 AM By: Fredirick Maudlin MD FACS Entered By: Fredirick Maudlin on 12/21/2022 09:51:28

## 2023-01-04 ENCOUNTER — Encounter (HOSPITAL_BASED_OUTPATIENT_CLINIC_OR_DEPARTMENT_OTHER): Payer: BC Managed Care – PPO | Attending: General Surgery | Admitting: General Surgery

## 2023-01-04 DIAGNOSIS — L97511 Non-pressure chronic ulcer of other part of right foot limited to breakdown of skin: Secondary | ICD-10-CM | POA: Diagnosis not present

## 2023-01-04 DIAGNOSIS — Z79899 Other long term (current) drug therapy: Secondary | ICD-10-CM | POA: Diagnosis not present

## 2023-01-04 DIAGNOSIS — I87332 Chronic venous hypertension (idiopathic) with ulcer and inflammation of left lower extremity: Secondary | ICD-10-CM | POA: Insufficient documentation

## 2023-01-04 DIAGNOSIS — L98492 Non-pressure chronic ulcer of skin of other sites with fat layer exposed: Secondary | ICD-10-CM | POA: Diagnosis not present

## 2023-01-04 DIAGNOSIS — L97512 Non-pressure chronic ulcer of other part of right foot with fat layer exposed: Secondary | ICD-10-CM | POA: Diagnosis not present

## 2023-01-04 DIAGNOSIS — I87331 Chronic venous hypertension (idiopathic) with ulcer and inflammation of right lower extremity: Secondary | ICD-10-CM | POA: Diagnosis not present

## 2023-01-04 DIAGNOSIS — L97312 Non-pressure chronic ulcer of right 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: Secondary | ICD-10-CM | POA: Insufficient documentation

## 2023-01-04 DIAGNOSIS — I872 Venous insufficiency (chronic) (peripheral): Secondary | ICD-10-CM | POA: Diagnosis not present

## 2023-01-04 DIAGNOSIS — G8221 Paraplegia, complete: Secondary | ICD-10-CM | POA: Insufficient documentation

## 2023-01-04 DIAGNOSIS — L97322 Non-pressure chronic ulcer of left 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: Secondary | ICD-10-CM | POA: Insufficient documentation

## 2023-01-04 DIAGNOSIS — L97518 Non-pressure chronic ulcer of other part of right foot with other specified severity: Secondary | ICD-10-CM | POA: Insufficient documentation

## 2023-01-04 DIAGNOSIS — L89313 Pressure ulcer of right buttock, stage 3: Secondary | ICD-10-CM | POA: Diagnosis not present

## 2023-01-04 DIAGNOSIS — I10 Essential (primary) hypertension: Secondary | ICD-10-CM | POA: Diagnosis not present

## 2023-01-04 DIAGNOSIS — L89323 Pressure ulcer of left buttock, stage 3: Secondary | ICD-10-CM | POA: Diagnosis not present

## 2023-01-04 DIAGNOSIS — L97812 Non-pressure chronic ulcer of other part of right lower leg with fat layer exposed: Secondary | ICD-10-CM | POA: Diagnosis not present

## 2023-01-04 DIAGNOSIS — L89893 Pressure ulcer of other site, stage 3: Secondary | ICD-10-CM | POA: Diagnosis not present

## 2023-01-05 NOTE — Progress Notes (Signed)
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 in the left leg. I'm thinking this is his usual degree of chronic venous inflammation. The wound on the left leg Chicas, Kristoph E ([id number]) [phone].pdf Page 6 of 38 looks somewhat better. Is less surface slough 01/27/17; erythema is back to the chronic venous inflammation. The wound on the left leg is somewhat better. I am back to the point where I like to try an Apligraf once again 02/10/17; slight improvement in wound dimensions. Apligraf #2. He is completing his doxycycline 02/14/17; patient arrives today having completed doxycycline last Thursday. This was supposed to be a nurse visit however once again he hasn't tense erythema from the medial part of his wound extending over the lower leg. Also erythema in his foot this is roughly in the same distribution as last time. He has baseline chronic venous inflammation however this is a lot worse than the baseline I have learned  to accept the on him is baseline inflammation 02/24/17- patient is here for follow-up evaluation. He is tolerating compression therapy. His voicing no complaints or concerns he is here anticipating an Apligraf 03/03/17; he arrives today with an adherent necrotic surface. I don't think this is surface is going to be amenable for Apligraf's. The erythema around his wound and on the left dorsal foot has resolved he is off antibiotics 03/10/17; better-looking surface today. I don't think he can tolerate Apligraf's. He tells me he had a wound VAC after a skin graft years ago to this area and they had difficulty with a seal. The erythema continues to be stable around this some degree of chronic venous inflammation but he also has recurrent cellulitis. We have been using Iodoflex 03/17/17; continued improvement in the surface and may be small changes in dimensions. Using Iodoflex which seems the only thing that will control his surface 03/24/17- He is here for follow up evaluation of his LLE lateral ulceration and ulcer to right dorsal foot/toe space. He is voicing no complaints or concerns, He is tolerating compression wrap. 03/31/17 arrives today with a much healthier looking wound on the left lower extremity. We  have been using Iodoflex for a prolonged period of time which has for the first time prepared and adequate looking wound bed although we have not had much in the way of wound dimension improvement. He also has a small wound between the first and second toe on the right 04/07/17; arrives today with a healthy-looking wound bed and at least the top 50% of this wound appears to be now her. No debridement was required I have changed him to [facility] last week after prolonged Iodoflex. He did not do well with Apligraf's. We've had a re-opening between the first and second toe on the right 04/14/17; arrives today with a healthier looking wound bed contractions and the top 50% of this wound and some on the  lesser 50%. Wound bed appears healthy. The area between the first and second toe on the right still remains problematic 04/21/17; continued very gradual improvement. Using [facility] 04/28/17; continued very gradual improvement in the left lateral leg venous insufficiency wound. His periwound erythema is very mild. We have been using Hydrofera Blue. Wound is making progress especially in the superior 50% 05/05/17; he continues to have very gradual improvement in the left lateral venous insufficiency wound. Both in terms with an length rings are improving. I debrided this every 2 weeks with #5 curet and we have been using Hydrofera Blue and again making good progress With regards to the wounds between his right first and second toe which I thought might of been tinea pedis he is not making as much progress very dry scaly skin over the area. Also the area at the base of the left first and second toe in a similar condition 05/12/17; continued gradual improvement in the refractory left lateral venous insufficiency wound on the left. Dimension smaller. Surface still requiring debridement using Hydrofera Blue 05/19/17; continued gradual improvement in the refractory left lateral venous ulceration. Careful inspection of the wound bed underlying rumination suggested some degree of epithelialization over the surface no debridement indicated. Continue Hydrofera Blue difficult areas between his toes first and third on the left than first and second on the right. I'm going to change to silver alginate from silver collagen. Continue ketoconazole as I suspect underlying tinea pedis 05/26/17; left lateral leg venous insufficiency wound. We've been using Hydrofera Blue. I believe that there is expanding epithelialization over the surface of the wound albeit not coming from the wound circumference. This is a bit of an odd situation in which the epithelialization seems to be coming from the surface of the wound rather  than in the exact circumference. There is still small open areas mostly along the lateral margin of the wound. He has unchanged areas between the left first and second and the right first second toes which I been treating for tenia pedis 06/02/17; left lateral leg venous insufficiency wound. We have been using Hydrofera Blue. Somewhat smaller from the wound circumference. The surface of the wound remains a bit on it almost epithelialized sedation in appearance. I use an open curette today debridement in the surface of all of this especially the edges Small open wounds remaining on the dorsal right first and second toe interspace and the plantar left first second toe and her face on the left 06/09/17; wound on the left lateral leg continues to be smaller but very gradual and very dry surface using Hydrofera Blue 06/16/17 requires weekly debridements now on the left lateral leg although this continues to contract. I changed to silver collagen last week because  of dryness of the wound bed. Using Iodoflex to the areas on his first and second toes/web space bilaterally 06/24/17; patient with history of paraplegia also chronic venous insufficiency with lymphedema. Has a very difficult wound on the left lateral leg. This has been gradually reducing in terms of with but comes in with a very dry adherent surface. High switch to silver collagen a week or so ago with hydrogel to keep the area moist. This is been refractory to multiple dressing attempts. He also has areas in his first and second toes bilaterally in the anterior and posterior web space. I had been using Iodoflex here after a prolonged course of silver alginate with ketoconazole was ineffective [question tinea pedis] 07/14/17; patient arrives today with a very difficult adherent material over his left lateral lower leg wound. He also has surrounding erythema and poorly controlled edema. He was switched his Santyl last visit which the nurses are applying  once during his doctor visit and once on a nurse visit. He was also reduced to 2 layer compression I'm not exactly sure of the issue here. 07/21/17; better surface today after 1 week of Iodoflex. Significant cellulitis that we treated last week also better. [Doxycycline] 07/28/17 better surface today with now 2 weeks of Iodoflex. Significant cellulitis treated with doxycycline. He has now completed the doxycycline and he is back to his usual degree of chronic venous inflammation/stasis dermatitis. He reminds me he has had ablations surgery here 08/04/17; continued improvement with Iodoflex to the left lateral leg wound in terms of the surface of the wound although the dimensions are better. 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 Problematic areas on the plantar aspect of the first second toe web space on the left and the dorsal aspect of the first second toe web space on the right. At one point I felt these were probably related to chronic fungal infections in treated him aggressively for this although we have not made any improvement here. 08/11/17; left lateral leg. Surface continues to improve with the Iodoflex although we are not seeing much improvement in overall wound dimensions. Areas on his plantar left foot and right foot show no improvement. In fact the right foot looks somewhat worse 08/18/17; left lateral leg. We changed to [facility] Blue last week after a prolonged course of Iodoflex which helps get the surface better. It appears that the wound with is improved. Continue with difficult areas on the left dorsal first second and plantar first second on the right 09/01/17; patient arrives in clinic today having had a temperature of 103 yesterday. He was seen in the ER and [facility]. The patient was concerned he could have cellulitis again in the right leg however they diagnosed him with a UTI and he is now on Keflex. He has a  history of cellulitis which is been recurrent and difficult but this is been in the left leg, in the past 5 use doxycycline. He does in and out catheterizations at home which are risk factors for UTI 09/08/17; patient will be completing his Keflex this weekend. The erythema on the left leg is considerably better. He has a new wound today on the medial part of the right leg small superficial almost looks like a skin tear. He has worsening of the area on the right dorsal first and second toe. His major area on the left lateral leg is better. Using Hydrofera Blue on all areas 09/15/17; gradual reduction in  width on the long wound in the left lateral leg. No debridement required. He also has wounds on the plantar aspect of his left first second toe web space and on the dorsal aspect of the right first second toe web space. 09/22/17; there continues to be very gradual improvements in the dimensions of the left lateral leg wound. He hasn't round erythematous spot with might be pressure on his wheelchair. There is no evidence obviously of infection no purulence no warmth He has a dry scaled area on the plantar aspect of the left first second toe Improved area on the dorsal right first second toe. 09/29/17; left lateral leg wound continues to improve in dimensions mostly with an is still a fairly long but increasingly narrow wound. He has a dry scaled area on the plantar aspect of his left first second toe web space Increasingly concerning area on the dorsal right first second toe. In fact I am concerned today about possible cellulitis around this wound. The areas extending up his second toe and although there is deformities here almost appears to abut on the nailbed. 10/06/17; left lateral leg wound continues to make very gradual progress. Tissue culture I did from the right first second toe dorsal foot last time grew MRSA and enterococcus which was vancomycin sensitive. This was not sensitive to clindamycin  or doxycycline. He is allergic to Zyvox and sulfa we have therefore arrange for him to have dalvance infusion tomorrow. He is had this in the past and tolerated it well 10/20/17; left lateral leg wound continues to make decent progress. This is certainly reduced in terms of with there is advancing epithelialization.The cellulitis in the right foot looks better although he still has a deep wound in the dorsal aspect of the first second toe web space. Plantar left first toe web space on the left I think is making some progress 10/27/17; left lateral leg wound continues to make decent progress. Advancing epithelialization.using Hydrofera Blue The right first second toe web space wound is better-looking using silver alginate Hefley, Rafeal E ([id number].pdf Page 7 of 38 Improvement in the left plantar first second toe web space. Again using silver alginate 11/03/17 left lateral leg wound continues to make decent progress albeit slowly. Using [facility] The right per second toe web space continues to be a very problematic looking punched out wound. I obtained a piece of tissue for deep culture I did extensively treated this for fungus. It is difficult to imagine that this is a pressure area as the patient states other than going outside he doesn't really wear shoes at home The left plantar first second toe web space looked fairly senescent. Necrotic edges. This required debridement change to [facility] Blue to all wound areas 11/10/17; left lateral leg wound continues to contract. Using Hydrofera Blue On the right dorsal first second toe web space dorsally. Culture I did of this area last week grew MRSA there is not an easy oral option in this patient was multiple antibiotic allergies or intolerances. This was only a rare culture isolate I'm therefore going to use Bactroban under silver alginate On the left plantar first second toe web space. Debridement is required  here. This is also unchanged 11/17/17; left lateral leg wound continues to contract using Hydrofera Blue this is no longer the major issue. The major concern here is the right first second toe web space. He now has an open area going from dorsally to the plantar aspect. There is now wound on the  inner lateral part of the first toe. Not a very viable surface on this. There is erythema spreading medially into the forefoot. No major change in the left first second toe plantar wound 11/24/17; left lateral leg wound continues to contract using Hydrofera Blue. Nice improvement today The right first second toe web space all of this looks a lot less angry than last week. I have given him clindamycin and topical Bactroban for MRSA and terbinafine for the possibility of underlining tinea pedis that I could not control with ketoconazole. Looks somewhat better The area on the plantar left first second toe web space is weeping with dried debris around the wound 12/01/17; left lateral leg wound continues to contract he Hydrofera Blue. It is becoming thinner in terms of with nevertheless it is making good improvement. The right first second toe web space looks less angry but still a large necrotic-looking wounds starting on the plantar aspect of the right foot extending between the toes and now extensively on the base of the right second toe. I gave him clindamycin and topical Bactroban for MRSA anterior benefiting for the possibility of underlying tinea pedis. Not looking better today The area on the left first/second toe looks better. Debrided of necrotic debris 12/05/17* the patient was worked in urgently today because over the weekend he found blood on his incontinence bad when he woke up. He was found to have an ulcer by his wife who does most of his wound care. He came in today for [place] to look at this. He has not had a history of wounds in his buttocks in spite of his paraplegia. 12/08/17; seen in follow-up today  at his usual appointment. He was seen earlier this week and found to have a new wound on his buttock. We also follow him for wounds on the left lateral leg, left first second toe web space and right first second toe web space 12/15/17; we have been using Hydrofera Blue to the left lateral leg which has improved. The right first second toe web space has also improved. Left first second toe web space plantar aspect looks stable. The left buttock has worsened using Santyl. Apparently the buttock has drainage 12/22/17; we have been using Hydrofera Blue to the left lateral leg which continues to improve now 2 small wounds separated by normal skin. He tells [place] he had a fever up to 100 yesterday he is prone to UTIs but has not noted anything different. He does in and out catheterizations. The area between the first and second toes today does not look good necrotic surface covered with what looks to be purulent drainage and erythema extending into the third toe. I had gotten this to something that I thought look better last time however it is not look good today. He also has a necrotic surface over the buttock wound which is expanded. I thought there might be infection under here so I removed a lot of the surface with a #5 curet though nothing look like it really needed culturing. He is been using Santyl to this area 12/27/17; his original wound on the left lateral leg continues to improve using Hydrofera Blue. I gave him samples of Baxdella although he was unable to take them out of fear for an allergic reaction ["lump in his throat"].the culture I did of the purulent drainage from his second toe last week showed both enterococcus and a set Enterobacter I was also concerned about the erythema on the bottom of his foot although  paradoxically although this looks somewhat better today. 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 We've been using silver alginate here. They came up today that he sleeps in a recliner, I'm not sure why but I asked him to stop this 01/03/18; his original wound we've been using Hydrofera Blue is now separated into 2 areas. Ulcer on his left buttock is better he is off the recliner and sleeping in bed Finally both wound areas between his first and second toes also looks some better 01/10/18; his original wound on the left lateral leg is now separated into 2 wounds we've been using Hydrofera Blue Ulcer on his left buttock has some drainage. There is a small probing site going into muscle layer superiorly.using silver alginate -He arrives today with a deep tissue injury on the left heel The wound on the dorsal aspect of his first second toe on the left looks a lot betterusing silver alginate ketoconazole The area on the first second toe web space on the right also looks a lot bette 01/17/18; his original wound on the left lateral leg continues to progress using Hydrofera Blue Ulcer on his left buttock also is smaller surface healthier except for a small probing site going into the muscle layer superiorly. 2.4 cm of tunneling in this area DTI on his left heel we have only been offloading. Looks better than last week no threatened open no evidence of infection the wound on the dorsal aspect of the first second toe on the left continues to look like it's regressing we have only been using silver alginate and terbinafine orally The area in the first second toe web space on the right also looks to be a lot better using silver alginate and terbinafine I think this was prompted by tinea pedis 01/31/18; the patient was hospitalized in [place] last week apparently for a complicated UTI. He was discharged on cefepime he does in and out catheterizations. In the hospital he was discovered M I don't mild elevation of AST and ALT and the terbinafine was stopped.predictably the pressure ulcer  on s his buttock looks betterusing silver alginate. The area on the left lateral leg also is better using Hydrofera Blue. The area between the first and second toes on the left better. First and second toes on the right still substantial but better. Finally the DTI on the left heel has held together and looks like it's resolving 02/07/18-he is here in follow-up evaluation for multiple ulcerations. He has new injury to the lateral aspect of the last issue a pressure ulcer, he states this is from adhesive removal trauma. He states he has tried multiple adhesive products with no success. All other ulcers appear stable. The left heel DTI is resolving. We will continue with same treatment plan and follow-up next week. 02/14/18; follow-up for multiple areas. He has a new area last week on the lateral aspect of his pressure ulcer more over the posterior trochanter. The original pressure ulcer looks quite stable has healthy granulation. 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 Almost fully epithelialized on the original superior area using Hydrofera Blue DTI on the left heel has peeled off this week to reveal a small superficial wound under denuded skin and subcutaneous tissue Both areas between the first and second toes look better including nothing open on the  left 02/21/18; The patient's wounds on his left ischial tuberosity and posterior left greater trochanter actually looked better. He has a large area of irritation around the area which I think is contact dermatitis. I am doubtful that this is fungal His original wound on the left lateral calf continues to improve we have been using Hydrofera Blue There is no open area in the left first second toe web space although there is a lot of thick callus The DTI on the left heel required debridement today of necrotic surface eschar and subcutaneous tissue  using silver alginate Finally the area on the right first second toe webspace continues to contract using silver alginate and ketoconazole 02/28/18 Left ischial tuberosity wounds look better using silver alginate. Original wound on the left calf only has one small open area left using Hydrofera Blue DTI on the left heel required debridement mostly removing skin from around this wound surface. Using silver alginate The areas on the right first/second toe web space using silver alginate and ketoconazole Wilsey, Kongmeng E ([id number] Page 8 of 38 03/08/18 on evaluation today patient appears to be doing decently well as best I can tell in regard to his wounds. This is the first time that I have seen him as he generally is followed by Dr. Dellia Nims. With that being said none of his wounds appear to be infected he does have an area where there is some skin covering what appears to be a new wound on the left dorsal surface of his great toe. This is right at the nail bed. With that being said I do believe that debrided away some of the excess skin can be of benefit in this regard. Otherwise he has been tolerating the dressing changes without complication. 03/14/18; patient arrives today with the multiplicity of wounds that we are following. He has not been systemically unwell Original wound on the left lateral calf now only has 2 small open areas we've been using Hydrofera Blue which should continue The deep tissue injury on the left heel requires debridement today. We've been using silver alginate The left first second toe and the right first second toe are both are reminiscence what I think was tinea pedis. Apparently some of the callus Surface between the toes was removed last week when it started draining. Purulent drainage coming from the wound on the ischial tuberosity on the left. 03/21/18-He is here in follow-up evaluation for multiple wounds. 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 He tolerated debridement. The only change to last week's recommendations is to discontinue antifungal cream between toes. He will follow-up next week 03/28/18; following up for multiple wounds;Concern this week is streaking redness and swelling in the right foot. He is going to need antibiotics for this. 03/31/18; follow-up for right foot cellulitis. Streaking redness and swelling in the right foot on 03/28/18. 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 I put him on clindamycin 300 mg every 6 and brought him in for a quick check. He has an open wound between his first and second toes on the right foot as a potential source. 04/04/18; Right foot cellulitis is resolving he is completing clindamycin. This is truly good news Left lateral calf wound which is initial wound only has one small open area inferiorly this is close to healing out. He has compression stockings. We will use Hydrofera Blue right down to the epithelialization of this Nonviable surface on the  left heel which was initially pressure with a DTI. We've been using Hydrofera Blue. I'm going to switch this back to silver alginate Left first second toe/tinea pedis this looks better using silver alginate Right first second toe tinea pedis using silver alginate Large pressure ulcers on theLeft ischial tuberosity. Small wound here Looks better. I am uncertain about the surface over the large wound. Using silver alginate 04/11/18; Cellulitis in the right foot is resolved Left lateral calf wound which was his original wounds still has 2 tiny open areas remaining this is just about closed Nonviable surface on the left heel is better but still requires debridement Left first second toe/tinea pedis still open using silver alginate Right first second toe wound tinea pedis I asked him to go back to using ketoconazole and silver alginate Large  pressure ulcers on the left ischial tuberosity this shear injury here is resolved. Wound is smaller. No evidence of infection using silver alginate 04/18/18; Patient arrives with an intense area of cellulitis in the right mid lower calf extending into the right heel area. Bright red and warm. Smaller area on the left anterior leg. He has a significant history of MRSA. He will definitely need antibioticsdoxycycline He now has 2 open areas on the left ischial tuberosity the original large wound and now a satellite area which I think was above his initial satellite areas. Not a wonderful surface on this satellite area surrounding erythema which looks like pressure related. His left lateral calf wound again his original wound is just about closed Left heel pressure injury still requiring debridement Left first second toe looks a lot better using silver alginate Right first second toe also using silver alginate and ketoconazole cream also looks better 04/20/18; the patient was worked in early today out of concerns with his cellulitis on the right leg. I had started him on doxycycline. This was 2 days ago. His wife was concerned about the swelling in the area. Also concerned about the left buttock. He has not been systemically unwell no fever chills. No nausea vomiting or diarrhea 04/25/18; the patient's left buttock wound is continued to deteriorate he is using Hydrofera Blue. He is still completing clindamycin for the cellulitis on the right leg although all of this looks better. 05/02/18 Left buttock wound still with a lot of drainage and a very tightly adherent fibrinous necrotic surface. He has a deeper area superiorly The left lateral calf wound is still closed DTI wound on the left heel necrotic surface especially the circumference using Iodoflex Areas between his left first second toe and right first second toe both look better. Dorsally and the right first second toe he had a necrotic surface  although at smaller. In using silver alginate and ketoconazole. I did a culture last week which was a deep tissue culture of the reminiscence of the open wound on the right first second toe dorsally. This grew a few Acinetobacter and a few methicillin-resistant staph aureus. Nevertheless the area actually this week looked better. I didn't feel the need to specifically address this at least in terms of systemic antibiotics. 05/09/18; wounds are measuring larger more drainage per our intake. We are using Santyl covered with alginate on the large superficial buttock wounds, Iodosorb on the left heel, ketoconazole and silver alginate to the dorsal first and second toes bilaterally. 05/16/18; The area on his left buttock better in some aspects although the area superiorly over the ischial tuberosity required an extensive debridement.using Santyl Left heel appears stable. Using  Iodoflex The areas between his first and second toes are not bad however there is spreading erythema up the dorsal aspect of his left foot this looks like cellulitis again. He is insensate the erythema is really very brilliant.o Erysipelas He went to see an allergist days ago because he was itching part of this he had lab work done. This showed a white count of 15.1 with 70% neutrophils. Hemoglobin of 11.4 and a platelet count of 659,000. Last white count we had in Epic was a 2-1/2 years ago which was 25.9 but he was ill at the time. He was able to show me some lab work that was done by his primary physician the pattern is about the same. 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 But prompted me to go ahead and do x-rays of both feet and the pelvis rule out osteomyelitis. He also had a comprehensive metabolic panel this was reasonably normal his albumin was 3.7 liver function tests BUN/creatinine all normal 05/23/18; x-rays of both his feet from last week were negative for underlying  pulmonary abnormality. The x-ray of his pelvis however showed mild irregularity in the left ischial which may represent some early osteomyelitis. The wound in the left ischial continues to get deeper clearly now exposed muscle. Each week necrotic surface material over this area. Whereas the rest of the wounds do not look so bad. The left ischial wound we have been using Santyl and calcium alginate T the left heel surface necrotic debris using Iodoflex o The left lateral leg is still healed Areas on the left dorsal foot and the right dorsal foot are about the same. There is some inflammation on the left which might represent contact dermatitis, fungal dermatitis I am doubtful cellulitis although this looks better than last week 05/30/18; CT scan done at Hospital did not show any osteomyelitis or abscess. Suggested the possibility of underlying cellulitis although I don't see a lot of evidence of this at the bedside The wound itself on the left buttock/upper thigh actually looks somewhat better. No debridement Left heel also looks better no debridement continue Iodoflex Both dorsal first second toe spaces appear better using Lotrisone. Left still required debridement 06/06/18; Intake reported some purulent looking drainage from the left gluteal wound. Using Santyl and calcium alginate Left heel looks better although still a nonviable surface requiring debridement The left dorsal foot first/second webspace actually expanding and somewhat deeper. I may consider doing a shave biopsy of this area Right dorsal foot first/second webspace appears stable to improved. Using Lotrisone and silver alginate to both these areas 06/13/18 Left gluteal surface looks better. Now separated in the 2 wounds. No debridement required. Still drainage. We'll continue silver alginate Left heel continues to look better with Iodoflex continue this for at least another week Of his dorsal foot wounds the area on the left still  has some depth although it looks better than last week. We've been using Lotrisone and silver alginate Chauvin, Broly E ([id number]) [phone].pdf Page 9 of 38 06/20/18 Left gluteal continues to look better healthy tissue Left heel continues to look better healthy granulation wound is smaller. He is using Iodoflex and his long as this continues continue the Iodoflex Dorsal right foot looks better unfortunately dorsal left foot does not. There is swelling and erythema of his forefoot. He had minor trauma to this several days ago but doesn't think this was enough to have caused any tissue injury. Foot looks  like cellulitis, we have had this problem before 06/27/18 on evaluation today patient appears to be doing a little worse in regard to his foot ulcer. 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 Both of which would really be his only options for treating this infection. In the past he has been given and effusion of Orbactiv. This is done very well for him in the past again it's one time dosing IV antibiotic therapy. Subsequently I do believe this is something we're gonna need to see about doing at this point in time. Currently his other wounds seem to be doing somewhat better in my pinion I'm pretty happy in that regard. 07/03/18 on evaluation today patient's wounds actually appear to be doing fairly well. He has been tolerating the dressing changes without complication. All in all he seems to be showing signs of improvement. In regard to the antibiotics he has been dealing with infectious disease since I saw him last week as far as getting this scheduled. In the end [facility]'s [facility] this coming Friday. In the meantime he has been continuing to perform the dressing changes in such as  previous. There does not appear to be any evidence of infection worsengin at this time. 07/10/18; Since I last saw this man 2 weeks ago things have actually improved. IV antibiotics of resulted in less forefoot erythema although there is still some present. He is not systemically unwell Left buttock wounds 2 now have no depth there is increased epithelialization Using silver alginate Left heel still requires debridement using Iodoflex Left dorsal foot still with a sizable wound about the size of a border but healthy granulation Right dorsal foot still with a slitlike area using silver alginate 07/18/18; the patient's cellulitis in the left foot is improved in fact I think it is on its way to resolving. Left buttock wounds 2 both look better although the larger one has hypertension granulation we've been using silver alginate Left heel has some thick circumferential redundant skin over the wound edge which will need to be removed today we've been using Iodoflex Left dorsal foot is still a sizable wound required debridement using silver alginate The right dorsal foot is just about closed only a small open area remains here 07/25/18; left foot cellulitis is resolved Left buttock wounds 2 both look better. Hyper-granulation on the major area Left heel as some debris over the surface but otherwise looks a healthier wound. Using silver collagen Right dorsal foot is just about closed 07/31/18; arrives with our intake nurse worried about purulent drainage from the buttock. We had hyper-granulation here last week His buttock wounds 2 continue to look better Left heel some debris over the surface but measuring smaller. Right dorsal foot unfortunately has openings between the toes Left foot superficial wound looks less aggravated. 08/07/18 Buttock wounds continue to look better although some of her granulation and the larger medial wound. silver alginate Left heel continues to look a lot better.silver  collagen Left foot superficial wound looks less stable. Requires debridement. He has a new wound superficial area on the foot on the lateral dorsal foot. Right foot looks better using silver alginate without Lotrisone 08/14/2018; patient was in the ER last week diagnosed with a UTI. He is now on Cefpodoxime and Macrodantin. Buttock wounds continued to  be smaller. Using silver alginate Left heel continues to look better using silver collagen Left foot superficial wound looks as though it is improving Right dorsal foot area is just about healed. 08/21/2018; patient is completed his antibiotics for his UTI. He has 2 open areas on the buttocks. There is still not closed although the surface looks satisfactory. Using silver alginate Left heel continues to improve using silver collagen The bilateral dorsal foot areas which are at the base of his first and second toes/possible tinea pedis are actually stable on the left but worse on the right. The area on the left required debridement of necrotic surface. After debridement I obtained a specimen for PCR culture. The right dorsal foot which is been just about healed last week is now reopened 08/28/2018; culture done on the left dorsal foot showed coag negative staph both staph epidermidis and Lugdunensis. I think this is worthwhile initiating systemic treatment. I will use doxycycline given his long list of allergies. The area on the left heel slightly improved but still requiring debridement. The large wound on the buttock is just about closed whereas the smaller one is larger. Using silver alginate in this area 09/04/2018; patient is completing his doxycycline for the left foot although this continues to be a very difficult wound area with very adherent necrotic debris. We are using silver alginate to all his wounds right foot left foot and the small wounds on his buttock, silver collagen on the left heel. 09/11/2018; once again this patient has intense  erythema and swelling of the left forefoot. Lesser degrees of erythema in the right foot. He has a long list of allergies and intolerances. I will reinstitute doxycycline. 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 Using silver alginate Left heel also looks better using silver collagen Unfortunately both the areas on his feet look worse. The area on the left first second webspace is now gone through to the plantar part of his foot. The area on the left foot anteriorly is irritated with erythema and swelling in the forefoot. 09/25/2018 His wound on the left plantar heel looks better. Using silver collagen The area on the left buttock 2 small remnant areas. One is closed one is still open. Using silver alginate The areas between both his first and second toes look worse. This in spite of long-standing antifungal therapy with ketoconazole and silver alginate which should have antifungal activity He has small areas around his original wound on the left calf one is on the bottom of the original scar tissue and one superiorly both of these are small and superficial but again given wound history in this site this is worrisome 10/02/2018 Left plantar heel continues to gradually contract using silver collagen Left buttock wound is unchanged using silver alginate The areas on his dorsal feet between his first and second toes bilaterally look about the same. I prescribed clindamycin ointment to see if we can address chronic staph colonization and also the underlying possibility of erythrasma The left lateral lower extremity wound is actually on the lateral part of his ankle. Small open area here. We have been using silver alginate 10/09/2018; Left plantar heel continues to look healthy and contract. No debridement is required Left buttock slightly smaller with a tape injury wound just below which was new this week Dorsal feet somewhat improved I have been  using clindamycin Left lateral looks lower extremity the actual open area looks worse although a  lot of this is epithelialized. I am going to change to silver collagen today He has a lot more swelling in the right leg although this is not pitting not red and not particularly warm there is a lot of spasm in the right leg usually indicative of people with paralysis of some underlying discomfort. We have reviewed his vascular status from 2017 he had a left greater saphenous vein ablation. I wonder about referring him back to vascular surgery if the area on the left leg continues to deteriorate. 10/16/2018 in today for follow-up and management of multiple lower extremity ulcers. His left Buttock wound is much lower smaller and almost closed Mauriello, [place] E ([id number]) [phone].pdf Page 10 of 38 completely. The wound to the left ankle has began to reopen with Epithelialization and some adherent slough. He has multiple new areas to the left foot and leg. The left dorsal foot without much improvement. Wound present between left great webspace and 2nd toe. Erythema and edema present right leg. Right LE ultrasound obtained on 10/10/18 was negative for DVT . 10/23/2018; Left buttock is closed over. Still dry macerated skin but there is no open wound. I suspect this is chronic pressure/moisture Left lateral calf is quite a bit worse than when I saw this last. There is clearly drainage here he has macerated skin into the left plantar heel. We will change the primary dressing to alginate Left dorsal foot has some improvement in overall wound area. Still using clindamycin and silver alginate Right dorsal foot about the same as the left using clindamycin and silver alginate The erythema in the right leg has resolved. He is DVT rule out was negative Left heel pressure area required debridement although the wound is smaller and the surface is health 10/26/2018 The patient came back in for  his nurse check today predominantly because of the drainage coming out of the left lateral leg with a recent reopening of his original wound on the left lateral calf. He comes in today with a large amount of surrounding erythema around the wound extending from the calf into the ankle and even in the area on the dorsal foot. He is not systemically unwell. He is not febrile. Nevertheless this looks like cellulitis. We have been using silver alginate to the area. I changed him to a regular visit and I am going to prescribe him doxycycline. The rationale here is a long list of medication intolerances and a history of MRSA. I did not see anything that I thought would provide a valuable culture 10/30/2018 Follow-up from his appointment 4 days ago with really an extensive area of cellulitis in the left calf left lateral ankle and left dorsal foot. I put him on doxycycline. He has a long list of medication allergies which are true allergy reactions. 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 In any case he is a lot better today. The erythema especially in the anterior and lateral left calf is better. He still has left ankle erythema. He also is complaining about increasing edema in the right leg we have only been using Kerlix Coban and he has been doing the wraps at home. Finally he has a spotty rash on the medial part of his upper left calf which looks like folliculitis or perhaps wrap occlusion type injury. Small superficial macules not pustules 11/06/18 patient arrives today with again a considerable degree of erythema around the wound on the left lateral calf  extending into the dorsal ankle and dorsal foot. This is a lot worse than when I saw this last week. He is on doxycycline really with not a lot of improvement. He has not been systemically unwell Wounds on the; left heel actually looks improved. Original area on the left foot and  proximity to the first and second toes looks about the same. He has superficial areas on the dorsal foot, anterior calf and then the reopening of his original wound on the left lateral calf which looks about the same The only area he has on the right is the dorsal webspace first and second which is smaller. He has a large area of dry erythematous skin on the left buttock small open area here. 11/13/2018; the patient arrives in much better condition. The erythema around the wound on the left lateral calf is a lot better. Not sure whether this was the clindamycin or the TCA and ketoconazole or just in the improvement in edema control [stasis dermatitis]. In any case this is a lot better. The area on the left heel is very small and just about resolved using silver collagen we have been using silver alginate to the areas on his dorsal feet 11/20/2018; his wounds include the left lateral calf, left heel, dorsal aspects of both feet just proximal to the first second webspace. He is stable to slightly improved. I did not think any changes to his dressings were going to be necessary 11/27/2018 he has a reopening on the left buttock which is surrounded by what looks like tinea or perhaps some other form of dermatitis. The area on the left dorsal foot has some erythema around it I have marked this area but I am not sure whether this is cellulitis or not. Left heel is not closed. Left calf the reopening is really slightly longer and probably worse 1/13; in general things look better and smaller except for the left dorsal foot. Area on the left heel is just about closed, left buttock looks better only a small wound remains in the skin looks better [using Lotrisone] 1/20; the area on the left heel only has a few remaining open areas here. Left lateral calf about the same in terms of size, left dorsal foot slightly larger right lateral foot still not closed. The area on the left buttock has no open wound and the  surrounding skin looks a lot better 1/27; the area on the left heel is closed. Left lateral calf better but still requiring extensive debridements. The area on his left buttock is closed. He still has the open areas on the left dorsal foot which is slightly smaller in the right foot which is slightly expanded. We have been using Iodoflex on these areas as well 2/3; left heel is closed. Left lateral calf still requiring debridement using Iodoflex there is no open area on his left buttock however he has dry scaly skin over a large area of this. Not really responding well to the Lotrisone. Finally the areas on his dorsal feet at the level of the first second webspace are slightly smaller on the right and about the same on the left. Both of these vigorously debrided with Anasept and gauze 2/10; left heel remains closed he has dry erythematous skin over the left buttock but there is no open wound here. Left lateral leg has come in and with. Still requiring debridement we have been using Iodoflex here. Finally the area on the left dorsal foot and right dorsal foot are  really about the same extremely dry callused fissured areas. He does not yet have a dermatology appointment 2/17; left heel remains closed. He has a new open area on the left buttock. The area on the left lateral calf is bigger longer and still covered in necrotic debris. No major change in his foot areas bilaterally. I am awaiting for a dermatologist to look on this. We have been using ketoconazole I do not know that this is been doing any good at all. 2/24; left heel remains closed. The left buttock wound that was new reopening last week looks better. The left lateral calf appears better also although still requires debridement. The major area on his foot is the left first second also requiring debridement. We have been putting Prisma on all wounds. I do not believe that the ketoconazole has done too much good for his feet. He will use  Lotrisone I am going to give him a 2-week course of terbinafine. We still do not have a dermatology appointment 3/2 left heel remains closed however there is skin over bone in this area I pointed this out to him today. The left buttock wound is epithelialized but still does not look completely stable. The area on the left leg required debridement were using silver collagen here. With regards to his feet we changed to Lotrisone last week and silver alginate. 3/9; left heel remains closed. Left buttock remains closed. The area on the right foot is essentially closed. The left foot remains unchanged. Slightly smaller on the left lateral calf. Using silver collagen to both of these areas 3/16-Left heel remains closed. Area on right foot is closed. Left lateral calf above the lateral malleolus open wound requiring debridement with easy bleeding. Left dorsal wound proximal to first toe also debrided. Left ischial area open new. Patient has been using Prisma with wrapping every 3 days. Dermatology appointment is apparently tomorrow.Patient has completed his terbinafine 2-week course with some apparent improvement according to him, there is still flaking and dry skin in his foot on the left 3/23; area on the right foot is reopened. The area on the left anterior foot is about the same still a very necrotic adherent surface. He still has the area on the left leg and reopening is on the left buttock. He apparently saw dermatology although I do not have a note. According to the patient who is usually fairly well informed they did not have any good ideas. Put him on oral terbinafine which she is been on before. 3/30; using silver collagen to all wounds. Apparently his dermatologist put him on doxycycline and rifampin presumably some culture grew staph. I do not have this result. He remains on terbinafine although I have used terbinafine on him before 4/6; patient has had a fairly substantial reopening on the  right foot between the first and second toes. He is finished his terbinafine and I believe is on doxycycline and rifampin still as prescribed by dermatology. We have been using silver collagen to all his wounds although the patient reports that he thinks silver alginate does better on the wounds on his buttock. 4/13; the area on his left lateral calf about the same size but it did not require debridement. Left dorsal foot just proximal to the webspace between the first and second toes is about the same. Still nonviable surface. I note some superficial bronze discoloration of the dorsal part of his foot Right dorsal foot just proximal to the first and second toes also looks about  the same. I still think there may be the same discoloration I noted above on the left Left buttock wound looks about the same 4/20; left lateral calf appears to be gradually contracting using silver collagen. 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 The left dorsal foot wound is debrided of tightly adherent necrotic debris and really cleans up quite nicely. The right area is worse with expansion. I did not debride this it is now over the base of the second toe The area on his left buttock is smaller no debridement is required using silver collagen 5/4; left calf continues to make good progress. He arrives with erythema around the wounds on his dorsal foot which even extends to the plantar aspect. Very concerning for coexistent infection. He is finished the erythromycin I gave him for possible erythrasma this does not seem to have helped. Hardwick, Shawnte E ([id number]) [phone].pdf Page 11 of 38 The area on the left foot is about the same base of the dorsal toes Is area on the buttock looks improved on the left 5/11; left calf and left buttock continued to make good progress. Left foot is about the same to slightly improved. Major problem is on  the right foot. He has not had an x-ray. Deep tissue culture I did last week showed both Enterobacter and E. coli. I did not change the doxycycline I put him on empirically although neither 1 of these were plated to doxycycline. He arrives today with the erythema looking worse on both the dorsal and plantar foot. Macerated skin on the bottom of the foot. he has not been systemically unwell 5/18-Patient returns at 1 week, left calf wound appears to be making some progress, left buttock wound appears slightly worse than last time, left foot wound looks slightly better, right foot redness is marginally better. X-ray of both feet show no air or evidence of osteomyelitis. Patient is finished his Omnicef and terbinafine. He continues to have macerated skin on the bottom of the left foot as well as right 5/26; left calf wound is better, left buttock wound appears to have multiple small superficial open areas with surrounding macerated skin. X-rays that I did last time showed no evidence of osteomyelitis in either foot. He is finished cefdinir and doxycycline. I do not think that he was on terbinafine. He continues to have a large superficial open area on the right foot anterior dorsal and slightly between the first and second toes. I did send him to dermatology 2 months ago or so wondering about whether they would do a fungal scraping. I do not believe they did but did do a culture. We have been using silver alginate to the toe areas, he has been using antifungals at home topically either ketoconazole or Lotrisone. We are using silver collagen on the left foot, silver alginate on the right, silver collagen on the left lateral leg and silver alginate on the left buttock 6/1; left buttock area is healed. We have the left dorsal foot, left lateral leg and right dorsal foot. We are using silver alginate to the areas on both feet and silver collagen to the area on his left lateral calf 6/8; the left buttock  apparently reopened late last week. He is not really sure how this happened. He is tolerating the terbinafine. Using silver alginate to all wounds 6/15; left buttock wound is larger than last week but still superficial. Came in the clinic today with a report of purulence from  the left lateral leg I did not identify any infection Both areas on his dorsal feet appear to be better. He is tolerating the terbinafine. Using silver alginate to all wounds 6/22; left buttock is about the same this week, left calf quite a bit better. His left foot is about the same however he comes in with erythema and warmth in the right forefoot once again. Culture that I gave him in the beginning of May showed Enterobacter and E. coli. I gave him doxycycline and things seem to improve although neither 1 of these organisms was specifically plated. 6/29; left buttock is larger and dry this week. Left lateral calf looks to me to be improved. Left dorsal foot also somewhat improved right foot completely unchanged. The erythema on the right foot is still present. He is completing the Ceftin dinner that I gave him empirically [see discussion above.) 7/6 - All wounds look to be stable and perhaps improved, the left buttock wound is slightly smaller, per patient bleeds easily, completed ceftin, the right foot redness is less, he is on terbinafine 7/13; left buttock wound about the same perhaps slightly narrower. Area on the left lateral leg continues to narrow. Left dorsal foot slightly smaller right foot about the same. We are using silver alginate on the right foot and Hydrofera Blue to the areas on the left. Unna boot on the left 2 layer compression on the right 7/20; left buttock wound absolutely the same. Area on lateral leg continues to get better. Left dorsal foot require debridement as did the right no major change in the 7/27; left buttock wound the same size necrotic debris over the surface. The area on the lateral leg  is closed once again. His left foot looks better right foot about the same although there is some involvement now of the posterior first second toe area. He is still on terbinafine which I have given him for a month, not certain a centimeter major change 06/25/19-All wounds appear to be slightly improved according to report, left buttock wound looks clean, both foot wounds have minimal to no debris the right dorsal foot has minimal slough. We are using Hydrofera Blue to the left and silver alginate to the right foot and ischial wound. 8/10-Wounds all appear to be around the same, the right forefoot distal part has some redness which was not there before, however the wound looks clean and small. Ischial wound looks about the same with no changes 8/17; his wound on the left lateral calf which was his original chronic venous insufficiency wound remains closed. Since I last saw him the areas on the left dorsal foot right dorsal foot generally appear better but require debridement. The area on his left initial tuberosity appears somewhat larger to me perhaps hyper granulated and bleeds very easily. We have been using Hydrofera Blue to the left dorsal foot and silver alginate to everything else 8/24; left lateral calf remains closed. The areas on his dorsal feet on the webspace of the first and second toes bilaterally both look better. The area on the left buttock which is the pressure ulcer stage II slightly smaller. I change the dressing to Hydrofera Blue to all areas 8/31; left lateral calf remains closed. The area on his dorsal feet bilaterally look better. Using Hydrofera Blue. Still requiring debridement on the left foot. No change in the left buttock pressure ulcers however 9/14; left lateral calf remains closed. Dorsal feet look quite a bit better than 2 weeks ago. Flaking dry  skin also a lot better with the ammonium lactate I gave him 2 weeks ago. The area on the left buttock is improved. He states  that his Roho cushion developed a leak and he is getting a new one, in the interim he is offloading this vigorously 9/21; left calf remains closed. Left heel which was a possible DTI looks better this week. He had macerated tissue around the left dorsal foot right foot looks satisfactory and improved left buttock wound. I changed his dressings to his feet to silver alginate bilaterally. Continuing Hydrofera Blue on the left buttock. 9/28 left calf remains closed. Left heel did not develop anything [possible DTI] dry flaking skin on the left dorsal foot. Right foot looks satisfactory. Improved left buttock wound. We are using silver alginate on his feet Hydrofera Blue on the buttock. I have asked him to go back to the Lotrisone on his feet including the wounds and surrounding areas 10/5; left calf remains closed. The areas on the left and right feet about the same. A lot of this is epithelialized however debris over the remaining open areas. He is using Lotrisone and silver alginate. The area on the left buttock using Hydrofera Blue 10/26. Patient has been out for 3 weeks secondary to Covid concerns. He tested negative but I think his wife tested positive. He comes in today with the left foot substantially worse, right foot about the same. Even more concerning he states that the area on his left buttock closed over but then reopened and is considerably deeper in one aspect than it was before [stage III wound] 11/2; left foot really about the same as last week. Quarter sized wound on the dorsal foot just proximal to the first second toes. Surrounding erythema with areas of denuded epithelium. This is not really much different looking. Did not look like cellulitis this time however. Right foot area about the same.. We have been using silver alginate alginate on his toes Left buttock still substantial irritated skin around the wound which I think looks somewhat better. We have been using Hydrofera Blue  here. 11/9; left foot larger than last week and a very necrotic surface. Right foot I think is about the same perhaps slightly smaller. Debris around the circumference also addressed. Unfortunately on the left buttock there is been a decline. Satellite lesions below the major wound distally and now a an additional one posteriorly we have been using Hydrofera Blue but I think this is a pressure issue 11/16; left foot ulcer dorsally again a very adherent necrotic surface. Right foot is about the same. Not much change in the pressure ulcer on his left buttock. 11/30; left foot ulcer dorsally basically the same as when I saw him 2 weeks ago. Very adherent fibrinous debris on the wound surface. Patient reports a lot of drainage as well. 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 We have been using Iodoflex, patient changed back to alginate because of the drainage. Area on his right dorsal foot really looks benign with a healthier surface certainly a lot better than on the left. Left buttock wounds all improved using Hydrofera Blue 12/7; left dorsal foot again no improvement. Tightly adherent debris. PCR culture I did last week only showed likely skin contaminant. I have gone ahead and done a punch biopsy of this which is about the last thing in terms of investigations I can think to do. He has known venous insufficiency and venous hypertension and this  could be the issue here. The area on the right foot is about the same left buttock slightly worse according to our intake nurse secondary to [facility] Blue sticking to the wound 12/14; biopsy of the left foot that I did last time showed changes that could be related to wound healing/chronic stasis dermatitis phenomenon no neoplasm. We have been using silver alginate to both feet. I change the one on the left today to Sorbact and silver alginate to his other 2 wounds 12/28; the patient arrives with the  following problems; Major issue is the dorsal left foot which continues to be a larger deeper wound area. Still with a completely nonviable surface Paradoxically the area mirror image on the right on the right dorsal foot appears to be getting better. He had some loss of dry denuded skin from the lower part of his original wound on the left lateral calf. Some of this area looked a little vulnerable and for this reason we put him in wrap that on this side this week The area on his left buttock is larger. He still has the erythematous circular area which I think is a combination of pressure, sweat. This does not look like cellulitis or fungal dermatitis Hemmer, Guenther E ([id number]) [id number].pdf Page 12 of 38 11/26/2019; -Dorsal left foot large open wound with depth. Still debris over the surface. Using Sorbact The area on the dorsal right foot paradoxically has closed over He has a reopening on the left ankle laterally at the base of his original wound that extended up into the calf. This appears clean. The left buttock wound is smaller but with very adherent necrotic debris over the surface. We have been using silver alginate here as well The patient had arterial studies done in 2017. He had biphasic waveforms at the dorsalis pedis and posterior tibial bilaterally. ABI in the left was 1.17. Digit waveforms were dampened. He has slight spasticity in the great toes I do not think a TBI would be possible 1/11; the patient comes in today with a sizable reopening between the first and second toes on the right. This is not exactly in the same location where we have been treating wounds previously. According to our intake nurse this was actually fairly deep but 0.6 cm. The area on the left dorsal foot looks about the same the surface is somewhat cleaner using Sorbact, his MRI is in 2 days. We have not managed yet to get arterial studies. The new reopening on the left lateral calf  looks somewhat better using alginate. The left buttock wound is about the same using alginate 1/18; the patient had his ARTERIAL studies which were quite normal. ABI in the right at 1.13 with triphasic/biphasic waveforms on the left ABI 1.06 again with triphasic/biphasic waveforms. It would not have been possible to have done a toe brachial index because of spasticity. We have been using Sorbac to the left foot alginate to the rest of his wounds on the right foot left lateral calf and left buttock 1/25; arrives in clinic with erythema and swelling of the left forefoot worse over the first MTP area. This extends laterally dorsally and but also posteriorly. Still has an area on the left lateral part of the lower part of his calf wound it is eschared and clearly not closed. Area on the left buttock still with surrounding irritation and erythema. Right foot surface wound dorsally. The area between the right and first and second toes appears better. 2/1; The left foot wound  is about the same. Erythema slightly better I gave him a week of doxycycline empirically Right foot wound is more extensive extending between the toes to the plantar surface Left lateral calf really no open surface on the inferior part of his original wound however the entire area still looks vulnerable Absolutely no improvement in the left buttock wound required debridement. 2/8; the left foot is about the same. Erythema is slightly improved I gave him clindamycin last week. Right foot looks better he is using Lotrimin and silver alginate He has a breakdown in the left lateral calf. Denuded epithelium which I have removed Left buttock about the same were using Hydrofera Blue 2/15; left foot is about the same there is less surrounding erythema. Surface still has tightly adherent debris which I have debriding however not making any progress Right foot has a substantial wound on the medial right second toe between the first and second  webspace. Still an open area on the left lateral calf distal area. Buttock wound is about the same 2/22; left foot is about the same less surrounding erythema. Surface has adherent debris. Polymen Ag Right foot area significant wound between the first and second toes. We have been using silver alginate here Left lateral leg polymen Ag at the base of his original venous insufficiency wound Left buttock some improvement here 3/1; Right foot is deteriorating in the first second toe webspace. Larger and more substantial. We have been using silver alginate. Left dorsal foot about the same markedly adherent surface debris using PolyMem Ag Left lateral calf surface debris using PolyMem AG Left buttock is improved again using PolyMem Ag. He is completing his terbinafine. The erythema in the foot seems better. He has been on this for 2 weeks 3/8; no improvement in any wound area in fact he has a small open area on the dorsal midfoot which is new this week. He has not gotten his foot x-rays yet 3/15; his x-rays were both negative for osteomyelitis of both feet. No major change in any of his wounds on the extremities however his buttock wounds are better. We have been using polymen on the buttocks, left lower leg. Iodoflex on the left foot and silver alginate on the right 3/22; arrives in clinic today with the 2 major issues are the improvement in the left dorsal foot wound which for once actually looks healthy with a nice healthy wound surface without debridement. Using Iodoflex here. Unfortunately on the left lateral calf which is in the distal part of his original wound he came to the clinic here for there was purulent drainage noted some increased breakdown scattered around the original area and a small area proximally. We we are using polymen here will change to silver alginate today. His buttock wound on the left is better and I think the area on the right first second toe webspace is  also improved 3/29; left dorsal foot looks better. Using Iodoflex. Left ankle culture from deterioration last time grew E. coli, Enterobacter and Enterococcus. I will give him a course of cefdinir although that will not cover Enterococcus. The area on the right foot in the webspace of the first and second toe lateral first toe looks better. The area on his buttock is about healed Vascular appointment is on April 21. This is to look at his venous system vis--vis continued breakdown of the wounds on the left including the left lateral leg and left dorsal foot he. He has had previous ablations on this side 4/5;  the area between the right first and second toes lateral aspect of the first toe looks better. Dorsal aspect of the left first toe on the left foot also improved. Unfortunately the left lateral lower leg is larger and there is a second satellite wound superiorly. The usual superficial abrasions on the left buttock overall better but certainly not closed 4/12; the area between the right first and second toes is improved. Dorsal aspect of the left foot also slightly smaller with a vibrant healthy looking surface. No real change in the left lateral leg and the left buttock wound is healed He has an unaffordable co-pay for Apligraf. Appointment with vein and vascular with regards to the left leg venous part of the circulation is on 4/21 4/19; we continue to see improvement in all wound areas. Although this is minor. He has his vascular appointment on 4/21. The area on the left buttock has not reopened although right in the center of this area the skin looks somewhat threatened 4/26; the left buttock is unfortunately reopened. In general his left dorsal foot has a healthy surface and looks somewhat smaller although it was not measured as such. The area between his first and second toe webspace on the right as a small wound against the first toe. The patient saw vascular surgery. The real question I  was asking was about the small saphenous vein on the left. He has previously ablated left greater saphenous vein. Nothing further was commented on on the left. Right greater saphenous vein without reflux at the saphenofemoral junction or proximal thigh there was no indication for ablation of the right greater saphenous vein duplex was negative for DVT bilaterally. They did not think there was anything from a vascular surgery point of view that could be offered. They ABIs within normal limits 5/3; only small open area on the left buttock. The area on the left lateral leg which was his original venous reflux is now 2 wounds both which look clean. We are using Iodoflex on the left dorsal foot which looks healthy and smaller. He is down to a very tiny area between the right first and second toes, using silver alginate 5/10; all of his wounds appear better. We have much better edema control in 4 layer compression on the left. This may be the factor that is allowing the left foot and left lateral calf to heal. He has external compression garments at home 04/14/20-All of his wounds are progressing well, the left forefoot is practically closed, left ischium appears to be about the same, right toe webspace is also smaller. The left lateral leg is about the same, continue using Hydrofera Blue to this, silver alginate to the ischium, Iodoflex to the toe space on the right 6/7; most of his wounds outside of the left buttock are doing well. The area on the left lateral calf and left dorsal foot are smaller. The area on the right foot in between the first and second toe webspace is barely visible although he still says there is some drainage here is the only reason I did not heal this out. Unfortunately the area on the left buttock almost looks like he has a skin tear from tape. He has open wound and then a large flap of skin that we are trying to get adherence over an area just next to the remaining wound 6/21; 2  week follow-up. I believe is been here for nurse visits. Miraculously the area between his first and second toes on the  left dorsal foot is closed over. Still open on the right first second web space. The left lateral calf has 2 open areas. Distally this is more superficial. The proximal area had a little more depth and required debridement of adherent necrotic material. His buttock wound is actually larger we have been using silver alginate here 6/28; the patient's area on the left foot remains closed. Still open wet area between the first and second toes on the right and also extending into the plantar aspect. We have been using silver alginate in this location. He has 2 areas on the left lower leg part of his original long wounds which I think are better. We have been using Hydrofera Blue here. Hydrofera Blue to the left buttock which is stable Orchard, Omari E ([id number]) [phone].pdf Page 13 of 38 7/12; left foot remains closed. Left ankle is closed. May be a small area between his right first and second toes the only truly open area is on the left buttock. We have been using Hydrofera Blue here 7/19; patient arrives with marked deterioration especially in the left foot and ankle. We did not put him in a compression wrap on the left last week in fact he wore his juxta lite stockings on either side although he does not have an underlying stocking. He has a reopening on the left dorsal foot, left lateral ankle and a new area on the right dorsal ankle. More worrisome is the degree of erythema on the left foot extending on the lateral foot into the lateral lower leg on the left 7/26; the patient had erythema and drainage from the lateral left ankle last week. Culture of this grew MRSA resistant to doxycycline and clindamycin which are the 2 antibiotics we usually use with this patient who has multiple antibiotic allergies including linezolid, trimethoprim sulfamethoxazole. I had  give him an empiric doxycycline and he comes in the area certainly looks somewhat better although it is blotchy in his lower leg. He has not been systemically unwell. He has had areas on the left dorsal foot which is a reopening, chronic wounds on the left lateral ankle. Both of these I think are secondary to chronic venous insufficiency. The area between his first and second toes is closed as far as I can tell. He had a new wrap injury on the right dorsal ankle last week. Finally he has an area on the left buttock. We have been using silver alginate to everything except the left buttock we are using Hydrofera Blue 06/30/20-Patient returns at 1 week, has been given a sample dose pack of NUZYRA which is a tetracycline derivative [omadacycline], patient has completed those, we have been using silver alginate to almost all the wounds except the left ischium where we are using Hydrofera Blue all of them look better 8/16; since I last saw the patient he has been doing well. The area on the left buttock, left lateral ankle and left foot are all closed today. He has completed the [place] I gave him last time and tolerated this well. He still has open areas on the right dorsal ankle and in the right first second toe area which we are using silver alginate. 8/23; we put him in his bilateral external compression stockings last week as he did not have anything open on either leg except for concerning area between the right first and second toe. He comes in today with an area on the left dorsal foot slightly more proximal than the original wound, the  left lateral foot but this is actually a continuation of the area he had on the left lateral ankle from last time. As well he is opened up on the left buttock again. 8/30; comes in today with things looking a lot better. The area on the left lower ankle has closed down as has the left foot but with eschar in both areas. The area on the dorsal right ankle is also  epithelialized. Very little remaining of the left buttock wound. We have been using silver alginate on all wound areas 9/13; the area in the first second toe webspace on the right has fully epithelialized. He still has some vulnerable epithelium on the right and the ankle and the dorsal foot. He notes weeping. He is using his juxta lite stocking. On the left again the left dorsal foot is closed left lateral ankle is closed. We went to the juxta lite stocking here as well. Still vulnerable in the left buttock although only 2 small open areas remain here 9/27; 2-week follow-up. We did not look at his left leg but the patient says everything is closed. He is a bit disturbed by the amount of edema in his left foot he is using juxta lite stockings but asking about over the toes stockings which would be 30/40, will talk to him next time. According to him there is no open wound on either the left foot or the left ankle/calf He has an open area on the dorsal right calf which I initially point a wrap injury. He has superficial remaining wound on the left ischial tuberosity been using silver alginate although he says this sticks to the wound 10/5; we gave him 2-week follow-up but he called yesterday expressing some concerns about his right foot right ankle and the left buttock. He came in early. There is still no open areas on the left leg and that still in his juxta lite stocking 10/11; he only has 1 small area on the left buttock that remains measuring millimeters 1 mm. Still has the same irritated skin in this area. We recommended zinc oxide when this eventually closes and pressure relief is meticulously is he can do this. He still has an area on the dorsal part of his right first through third toes which is a bit irritated and still open and on the dorsal ankle near the crease of the ankle. We have been using silver alginate and using his own stocking. He has nothing open on the left leg or foot 10/25;  2-week follow-up. Not nearly as good on the left buttock as I was hoping. For open areas with 5 looking threatened small. He has the erythematous irritated chronic skin in this area. 1 area on the right dorsal ankle. He reports this area bleeds easily Right dorsal foot just proximal to the base of his toes We have been using silver alginate. 11/8; 2-week follow-up. Left buttock is about the same although I do not think the wounds are in the same location we have been using silver alginate. I have asked him to use zinc oxide on the skin around the wounds. He still has a small area on the right dorsal ankle he reports this bleeds easily Right dorsal foot just proximal to the base of the toes does not have anything open although the skin is very dry and scaly He has a new opening on the nailbed of the left great toe. Nothing on the left ankle 11/29; 3-week follow-up. Left buttock has 2 open  areas. And washing of these wounds today started bleeding easily. Suggesting very friable tissue. We have been using silver alginate. Right dorsal ankle which I thought was initially a wrap injury we have been using silver alginate. Nothing open between the toes that I can see. He states the area on the left dorsal toe nailbed healed after the last visit in 2 or 3 days 12/13; 3-week follow-up. His left buttock now has 3 open areas but the original 2 areas are smaller using polymen here. Surrounding skin looks better. The right dorsal ankle is closed. He has a small opening on the right dorsal foot at the level of the third toe. In general the skin looks better here. He is wearing his juxta lite stocking on the left leg says there is nothing open 11/24/2020; 3 weeks follow-up. His left buttock still has the 3 open areas. We have been using polymen but due to lack of response he changed to [facility] area. Surrounding skin is dry erythematous and irritated looking. There is no evidence of infection either bacterial  or fungal however there is loss of surface epithelium He still has very dry skin in his foot causing irritation and erythema on the dorsal part of his toes. This is not responded to prolonged courses of antifungal simply looks dry and irritated 1/24; left buttock area still looks about the same he was unable to find the triad ointment that we had suggested. The area on the right lower leg just above the dorsal ankle has reopened and the areas on the right foot between the first second and second third toes and scaling on the bottom of the foot has been about the same for quite some time now. been using silver alginate to all wound areas 2/7; left buttock wound looked quite good although not much smaller in terms of surface area surrounding skin looks better. Only a few dry flaking areas on the right foot in between the first and second toes the skin generally looks better here [ammonium lactate]. Finally the area on the right dorsal ankle is closed 2/21; There is no open area on the right foot even between the right first and second toe. Skin around this area dorsally and plantar aspects look better. He has a reopening of the area on the right ankle just above the crease of the ankle dorsally. I continue to think that this is probably friction from spasms may be even this time with his stocking under the compression stockings. Wounds on his left buttock look about the same there a couple of areas that have reopened. He has a total square area of loss of epithelialization. This does not look like infection it looks like a contact dermatitis but I just cannot determine to what 3/14; there is nothing on the right foot between the first and second toes this was carefully inspected under illumination. Some chronic irritation on the dorsal part of his foot from toes 1-3 at the base. Nothing really open here substantially. Still has an area on the right foot/ankle that is actually larger and  hyper granulated. His buttock area on the left is just about closed however he has chronic inflammation with loss of the surface epithelial layer 3/28; 2-week follow-up. In clinic today with a new wound on the left anterior mid tibia. Says this happened about 2 weeks ago. He is not really sure how wonders about the spasticity of his legs at night whether that could have caused this other than that  he does not have a good idea. He has been using topical antibiotics and silver alginate. The area on his right dorsal ankle seems somewhat better. Finally everything on his left buttock is closed. 4/11; 2-week follow-up. All of his wounds are better except for the area over the ischium and left buttock which have opened up widely again. At least part of this is covered in necrotic fibrinous material another part had rolled nonviable skin. The area on the right ankle, left anterior mid tibia are both a lot better. He had no open wounds on either foot including the areas between the first and second toes 4/25; patient presents for 2-week follow-up. He states that the wounds are overall stable. He has no complaints today and states he is using Hydrofera Blue to open wounds. 5/9; have not seen this man in over a month. For my memory he has open areas on the left mid tibia and right ankle. T oday he has new open area on the right dorsal foot which we have not had a problem with recently. He has the sustained area on the left buttock He is also changed his insurance at the beginning of the year [employer]. We will need prior authorizations for debridement 5/23; patient presents for 2-week follow-up. He has prior authorizations for debridement. He denies any issues in the past 2 weeks with his wound care. He has been using Hydrofera Blue to all the wounds. He does report a circular rash to the upper left leg that is new. He denies acute signs of infection. Fenstermacher, Bogdan E ([id number])  [phone].pdf Page 14 of 38 6/6; 2-week follow-up. The patient has open wounds on the left buttock which are worse than the last time I saw this about a month ago. He also has a new area to me on the left anterior mid tibia with some surrounding erythema. The area on the dorsal ankle on the right is closed but I think this will be a friction injury every time this area is exposed to either our wraps or his compression stockings caused by unrelenting spasms in this leg. 6/20; 2-week follow-up. The patient has open wounds on the left buttock which is about the same. Using [facility] here. - The left mid tibia has a static amount of surrounding erythema. Also a raised area in the center. We have been using Hydrofera Blue here. Finally he has broken down in his dorsal right foot extending between the first and second toes and going to the base of the first and second toe webspace. I have previously assumed that this was severe venous hypertension 7/5; 2-week follow-up The left buttock wound actually looks better. We are using Hydrofera Blue. He has extensive skin irritation around this area and I have not really been able to get that any better. I have tried Lotrisone i.e. antifungals and steroids. More most recently we have just been using Coloplast really looks about the same. The left mid tibia which was new last week culture to have very resistant staph aureus. Not only methicillin-resistant but doxycycline resistant. The patient has a plethora of antibiotic allergies including sulfa, linezolid. I used topical bacitracin on this but he has not started this yet. In addition he has an expanding area of erythema with a wound on the dorsal right foot. I did a deep tissue culture of this area today 7/12; Left buttock area actually looks better surrounding skin also looks less irritated. Left mid tibia looks about the same. He  is using bacitracin this is not worse Right  dorsal foot looks about the same as well. The left first toe also looks about the same 7/19; left buttock wound continues to improve in terms of open areas Left mid tibia is still concerning amount of swelling he is using bacitracin Dorsal left first toe somewhat smaller Right dorsal foot somewhat smaller 7/25; left buttock wound actually continues to improve Left mid tibia area has less swelling. I gave him all my samples of new Nuzyra. This seems to have helped although the wound is still open it. His abrasion closed by here Left dorsal great toe really no better. Still a very nonviable surface Right dorsal foot perhaps some better. We have been using bacitracin and silver nitrate to the areas on his lower legs and Hydrofera Blue to the area on the buttock. 8/16 Disappointed that his left buttock wound is actually more substantial. Apparently during the last nurse visit these were both very small. He has continued irritation to a large area of skin on his buttock. I have never been able to totally explain this although I think it some combination of the way he sits, pressure, moisture. He is not incontinent enough to contribute to this. Left dorsal great toe still fibrinous debris on the surface that I have debrided today Large area across the dorsal right toes. The area on the left anterior mid tibia has less swelling. He completed the [place]. This does not look infected although the tissue is still fried 8/30; 2-week follow-up. Left buttock areas not improved. We used Hydrofera Blue on this. Weeping wet with the surrounding erythema that I have not been able to control even with Lotrisone and topical Coloplast Left dorsal great toe looks about the same More substantial area again at the base of his toes on the left which is new this week. Area across the dorsal right toes looks improved The left anterior mid tibia looks like it is trying to close 9/13; 2-week follow-up. Using silver  alginate on all of his wounds. The left dorsal foot does not look any better. He has the area on the dorsal toe and also the areas at the base of all of his toes 1 through 3. On the right foot he has a similar pattern in a similar area. He has the area on his left mid tibia that looks fairly healthy. Finally the area on his left buttock looks somewhat bette 9/20; culture I did of the left foot which was a deep tissue culture last time showed E. coli he has erythema around this wound. Still a completely necrotic surface. His right dorsal foot looks about the same. He has a very friable surface to the left anterior mid tibia. Both buttock wounds look better. We have been using silver alginate to all wounds 10/4; he has completed the cephalexin that I gave him last time for the left foot. He is using topical gentamicin under silver alginate silver alginate being applied to all the wounds. Unfortunately all the wounds look irritated on his dorsal right foot dorsal left foot left mid tibia. I wonder if this could be a silver allergy. I am going to change him to [facility] on the lower extremity. The skin on the left buttock and left posterior thigh still flaking dry and irritated. This has continued no matter what I have applied topically to this. He has a solitary open wound which by itself does not look too bad however the entire area of  surrounding skin does not change no matter what we have applied here 10/18; the area on the left dorsal foot and right dorsal foot both look better. The area on the right extends into the plantar but not between his toes. We have been using silver alginate. He still has a rectangular erythematous area around the area on the left tibia. The wound itself is very small. Finally everything on his left buttock looks a little larger the skin is erythematous 11/15; patient comes in with the left dorsal and right dorsal foot distally looking somewhat better. Still nonviable  surface on the left foot which required debridement. He still has the area on the left anterior mid tibia although this looks somewhat better. He has a new area on the right lateral lower leg just above the ankle. Finally his left buttock looks terrible with multiple superficial open wounds geometric square shaped area of chronic erythema which I have not been able to sort through 11/29; right dorsal foot and left dorsal foot both look somewhat better. No debridement required. He has the fragile area on the left anterior mid tibia this looks and continues to look somewhat better. Right lateral lower leg just above the ankle we identified last time also looks better. In general the area on his left buttock looks improved. We are using Hydrofera Blue to all wound areas 12/13; right dorsal foot looks better. The area on the right lateral leg is healed. Left dorsal foot has 2 open areas both of which require debridement. The fragile area on the left anterior mid tibial looks better. Smaller area on his buttocks. Were using Hydrofera Blue 1/10; patient comes in with everything looking slightly larger and/or worse. This includes his left buttock, reopening of the left mid tibia, larger areas on the left dorsal foot and what looks to be a cellulitis on the right dorsal and plantar foot. We have been using Hydrofera Blue on all wounds. 1/17; right dorsal foot distally looks better today. The left foot has 2 open wounds that are about the same surrounding erythema. 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 No malignancy they did not stain for fungus 1/24; his right distal foot is not closed dry and scaly but the wound looks like it is contracting. I did not debride anything here. Problem on his left dorsal foot with expanding erythema. Apparently there were problems last week  getting the Elesa Hacker however it is now available at the [employer] but a week later. He is using ketoconazole and Coloplast to the left buttock along with Hydrofera Blue this actually looks quite a bit better today. 1/31; right dorsal foot again is dry and scaly but looking to contract. He has been using a moisturizer on his feet at my request but he is not sure which 1. The left dorsal foot wounds look about the same there is erythema here that I marked last week however after course of Nuzyra it certainly is not any better but not any worse either. Finally on his left buttock the skin continues to look better he has the original wound but a new substantial area towards the gluteal cleft. Almost like a skin tear. I used scissors to remove skin and subcutaneous tissue here silver nitrate and direct pressure 2/7; right dorsal foot. This does not look too much different from last week. Some erythema skin dry and scaly. No  debridement. Left dorsal great toe again still not much improvement. I did remove flaking dry skin and callus from around the edge. Finally on his left buttock. The skin is somewhat better in the [place], [place] ([id number]) [phone].pdf Page 15 of 38 periwound. Surface wounds are superficial somewhat better than last week. 01/26/2022: Is a little bit of a mystery as to why his wounds fail to respond to treatments and actually seem to get worse. This is my first encounter with this patient. He was previously followed by Dr. Dellia Nims. Based upon my review of the chart, it seems that there is a little bit of a mystery as to why his wounds do not respond as anticipated to the interventions applied and sometimes even get worse. Biopsies have been performed and he was seen by dermatology in [place], but that did not shed any light on the matter. T oday, his gluteal wound is larger, with substantial drainage, rather malodorous. The food wounds are not terrible,  but he has a lot of callus and scaly skin around these. He is currently getting silver alginate on the gluteal wound, with idodoflex to the feet. He is using lotrisone on his legs for the dry, scaly skin. 02/09/2022: There has really been no change to any of his wounds. The gluteal wound less drainage and odor, but remains about the same size, the periwound skin remains oddly scaly. His lower extremity wounds also appear roughly the same size. They continue to accumulate a small amount of slough. The periwound on his feet and ankle wounds has dry eschar and loose dead skin. We have been using silver alginate on the gluteal wound and Iodoflex on his feet and ankle wounds. T the periwound around his gluteal lesion and Lotrisone on his feet and legs. o 02/23/2022: The right plantar foot wound is closed. The gluteal site looks small but has continued to produce hypertrophic granulation tissue. The foot wounds all look about the same on the dorsal surface of the right foot; on the left, there is only a small open area at the site of where his left great toenail would have been. 03/16/2022: The right ankle wound is healed. The right dorsal foot wound is about the same. The left dorsal foot wound is quite a bit smaller and the ischial wound is nearly closed. 03/30/2022: The right ankle wound reopened. Both dorsal foot wounds are quite a bit smaller. Unfortunately, he appears to have sheared part of his ischial wound open further, perhaps during a transfer. 04/13/2022: The right ankle wound has hypertrophic granulation tissue present. The dorsal foot wounds continue to decrease in size. The ischial wound looks about the same today, no better, no worse. 04/27/2022: The right ankle wound has closed. Unfortunately, it looks like some moisture got underneath the dry skin on both of his dorsal feet and these wounds have expanded in size. The ischial wound remains the same with perhaps a little bit more slough  accumulation than at our previous visit. 05/11/2022: The right ankle wound remains closed. There is a left anterior tibial wound that is small has patchy openings with accumulated slough. The dorsum of his right foot appears to be nearly healed with just a small punctate opening. The plantar surface of his right foot has a new opening that looks like he may have picked some skin there. His sacral ulcer has hypertrophic granulation tissue but has some slough accumulation. The dorsum of his left foot has multiple open areas in a fairly ragged  distribution. All of these have slough accumulated within them. 06/01/2022: The right ankle and left anterior tibial wound are both closed. Dorsum of his right foot and left foot both look substantially better with just tiny scattered openings The without any slough accumulation. He has sheared open new areas on his left gluteus and ischium. He says that his wheelchair cushion, which is air-filled, has a leak and so it keeps deflating. He is awaiting a new cushion. 06/15/2022: The right ankle wound has reopened and the fat layer is exposed. Both dorsal feet have just small openings with just a little bit of slough and eschar accumulation. The wound on his left gluteus and ischium is larger again today and has a foul odor. 06/29/2022: The right ankle wound has hypertrophic granulation tissue buildup. His dorsal foot wounds both look better with just some eschar on the surface. He has a new wound on his left lateral ankle. He is not sure how he acquired it but by appearance, it looks that he hit it on something, potentially his wheelchair or bed. The ischial wound is about the same but is cleaner without any significant purulent drainage or odor. He did not understand what the [facility] call was about and therefore he does not have the topical compounded antibiotic. 07/13/2022: The right ankle wound again has hypertrophic granulation tissue, but less so than at his previous  visit. The ischial wound has improved tremendously the use of the [facility] topical antibiotic. No significant change to the left lateral ankle wound; it is fibrotic with slough present. The skin of both of his feet, especially on the right, has a yeasty appearance. 08/10/2022: There is again hypertrophic granulation tissue on the right anterior ankle wound. Both feet are about the same. The left lateral ankle wound is a little bit desiccated and has some slough buildup. He unfortunately suffered a new injury when he was removing his pants and they caught his bandage which caused a large skin tear on his left ischium, just distal to the existing wound. The existing left ischial wound, however, is significantly better with just a little light slough on the surface. 08/31/2022: The right anterior ankle wound is a lot smaller today underneath some eschar. No accumulation of hypertrophic granulation tissue. The left lateral ankle wound has some slough on the surface but is better in terms of moisture balance this week. The left dorsal foot does not really have any openings on it today. The right dorsal foot has some slough and eschar accumulation. His gluteal ulcer is basically closed aside from 2 small areas that are oozing a bit. 09/21/2022: The right anterior ankle wound is down to just a tiny pinhole. The left lateral ankle wound has accumulated slough again, but moisture balance is good. Left and right dorsal feet both have 2 small openings with some slough in them. The gluteal ulcer, unfortunately has opened up substantially. 10/05/2022: The right anterior ankle wound has reopened and has a fair amount of slough on the surface. The left lateral ankle wound has accumulated more slough, as well. He was approved for Apligraf, but the wound is not clean enough yet. There is some slough buildup on the dorsal right foot wound, as well as on his ischium. He did not pick up the doxycycline I prescribed for the  MRSA that grew out of his ankle wound culture. 10/26/2022: The right anterior ankle wound has closed again. The left lateral ankle wound looks quite a bit better. It is ready for  Apligraf but we do not have 1 here for him. His ischium continues to get worse, with the wound larger and deeper. It really seems like this is a combination of pressure and friction. He has 2 new wounds, 1 on the plantar aspect of each foot. He says that he had some dry skin there that peeled away. Both are superficial. The dorsum of his left foot does not have any new wound opening. The dorsum of his right foot has a bit of slough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 really look that healthy today perhaps slightly larger and measurements. The area on his dorsal right second toe is improved also the left foot wound looks stable to improved 08/16/16; the area on the last lateral leg did not change any of dimensions. Post debridement with a curet the area looked better. Left  foot wound improved and the area on the dorsal right second toe is improved 08/23/16; the area on the left lateral leg may be slightly smaller both in terms of length and width. Aggressive debridement with a curette afterwards the tissue appears healthier. Left foot wound appears improved in the area on the dorsal right second toe is improved 08/30/16 patient developed a fever over the weekend and was seen in an urgent care. Felt to have a UTI and put on doxycycline. He has been since changed over the phone to [facility]. After we took off the wrap on his right leg today the leg is swollen warm and erythematous, probably more likely the source of the fever 09/06/16; have been using collagen to the major left leg wound, silver alginate to the area on his anterior foot/toes 09/13/16; the areas on his anterior foot/toes on both sides appear to be virtually closed. Extensive wound on the left lateral leg perhaps slightly narrower but each visit still covered an adherent surface slough 09/16/16 patient was in for his usual Thursday nurse visit however the intake nurse noted significant erythema of his dorsal right foot. He is also running a low- grade fever and having increasing spasms in the right leg 09/20/16 here for cellulitis involving his right great toes and forefoot. This is a lot better. Still requiring debridement on his left lateral leg. Santyl direct says he needs prior authorization. Therefore his wife cannot change this at home 09/30/16; the patient's extensive area  on the left lateral calf and ankle perhaps somewhat better. Using Santyl. The area on the left toes is healed and I think the area on his right dorsal foot is healed as well. There is no cellulitis or venous inflammation involving the right leg. He is going to need compression stockings here. 10/07/16; the patient's extensive wound on the left lateral calf and ankle does not measure any differently however there appears to be less  adherent surface slough using Santyl and aggressive weekly debridements 10/21/16; no major change in the area on the left lateral calf. Still the same measurement still very difficult to debridement adherent slough and nonviable subcutaneous tissue. This is not really been helped by several weeks of Santyl. Previously for 2 weeks I used Iodoflex for a short period. A prolonged course of Hydrofera Blue didn't really help. I'm not sure why I only used 2 weeks of Iodoflex on this there is no evidence of surrounding infection. He has a small area on the right second toe which looks as though it's progressing towards closure 10/28/16; the wounds on his toes appear to be closed. No major change in the left lateral leg wound although the surface looks somewhat better using Iodoflex. He has had previous arterial studies that were normal. He has had reflux studies and is status post ablation although I don't have any exact notes on which vein was ablated. I'll need to check the surgical record 11/04/16; he's had a reopening between the first and second toe on the left and right. No major change in the left lateral leg wound. There is what appears to be cellulitis of the left dorsal foot 11/18/16 the patient was hospitalized initially in [place] and then subsequently transferred to [facility] long and was admitted there from 11/09/16 through 11/12/16. He had developed progressive cellulitis on the right leg in spite of the doxycycline I gave him. I'd spoken to the hospitalist in [place] who was concerned about continuing leukocytosis. CT scan is what I suggested this was done which showed soft tissue swelling without evidence of osteomyelitis or an underlying abscess blood cultures were negative. At [facility] he was treated with vancomycin and Primaxin and then add an infectious disease consult. Gaw, Zyaire E ([id number]) [phone].pdf Page 22 of 38 He was transitioned to Ceftaroline. He  has been making progressive improvement. Overall a severe cellulitis of the right leg. He is been using silver alginate to her original wound on the left leg. The wounds in his toes on the right are closed there is a small open area on the base of the left second toe 11/26/15; the patient's right leg is much better although there is still some edema here this could be reminiscent from his severe cellulitis likely on top of some degree of lymphedema. His left anterior leg wound has less surface slough as reported by her intake nurse. Small wound at the base of the left second toe 12/02/16; patient's right leg is better and there is no open wound here. His left anterior lateral leg wound continues to have a healthy-looking surface. Small wound at the base of the left second toe however there is erythema in the left forefoot which is worrisome 12/16/16; is no open wounds on his right leg. We took measurements for stockings. His left anterior lateral leg wound continues to have a healthy-looking surface. I'm not sure where we were with the Apligraf run through his insurance. We have been using Iodoflex. He has a thick eschar on the  left first second toe interface, I suspect this may be fungal however there is no visible open 12/23/16; no open wound on his right leg. He has 2 small areas left of the linear wound that was remaining last week. We have been using Prisma, I thought I have disclosed this week, we can only look forward to next week 01/03/17; the patient had concerning areas of erythema last week, already on doxycycline for UTI through his primary doctor. The erythema is absolutely no better there is warmth and swelling both medially from the left lateral leg wound and also the dorsal left foot. 01/06/17- Patient is here for follow-up evaluation of his left lateral leg ulcer and bilateral feet ulcers. He is on oral antibiotic therapy, tolerating that. Nursing staff and the patient states that the erythema  is improved from Monday. 01/13/17; the predominant left lateral leg wound continues to be problematic. I had put Apligraf on him earlier this month once. However he subsequently developed what appeared to be an intense cellulitis around the left lateral leg wound. I gave him Dalvance I think on 2/12 perhaps 2/13 he continues on cefdinir. The erythema is still present but the warmth and swelling is improved. I am hopeful that the cellulitis part of this control. I wouldn't be surprised if there is an element of venous inflammation as well. 01/17/17. The erythema is present but better in the left leg. His left lateral leg wound still does not have a viable surface buttons certain parts of this long thin wound it appears like there has been improvement in dimensions. 01/20/17; the erythema still present but much better in the left leg. I'm thinking this is his usual degree of chronic venous inflammation. The wound on the left leg looks somewhat better. Is less surface slough 01/27/17; erythema is back to the chronic venous inflammation. The wound on the left leg is somewhat better. I am back to the point where I like to try an Apligraf once again 02/10/17; slight improvement in wound dimensions. Apligraf #2. He is completing his doxycycline 02/14/17; patient arrives today having completed doxycycline last Thursday. This was supposed to be a nurse visit however once again he hasn't tense erythema from the medial part of his wound extending over the lower leg. Also erythema in his foot this is roughly in the same distribution as last time. He has baseline chronic venous inflammation however this is a lot worse than the baseline I have learned to accept the on him is baseline inflammation 02/24/17- patient is here for follow-up evaluation. He is tolerating compression therapy. His voicing no complaints or concerns he is here anticipating an Apligraf 03/03/17; he arrives today with an adherent necrotic surface. I  don't think this is surface is going to be amenable for Apligraf's. The erythema around his wound and on the left dorsal foot has resolved he is off antibiotics 03/10/17; better-looking surface today. I don't think he can tolerate Apligraf's. He tells me he had a wound VAC after a skin graft years ago to this area and they had difficulty with a seal. The erythema continues to be stable around this some degree of chronic venous inflammation but he also has recurrent cellulitis. We have been using Iodoflex 03/17/17; continued improvement in the surface and may be small changes in dimensions. Using Iodoflex which seems the only thing that will control his surface 03/24/17- He is here for follow up evaluation of his LLE lateral ulceration and ulcer to right dorsal foot/toe space. He  is voicing no complaints or concerns, He is tolerating compression wrap. 03/31/17 arrives today with a much healthier looking wound on the left lower extremity. We have been using Iodoflex for a prolonged period of time which has for the first time prepared and adequate looking wound bed although we have not had much in the way of wound dimension improvement. He also has a small wound between the first and second toe on the right 04/07/17; arrives today with a healthy-looking wound bed and at least the top 50% of this wound appears to be now her. No debridement was required I have changed him to [facility] last week after prolonged Iodoflex. He did not do well with Apligraf's. We've had a re-opening between the first and second toe on the right 04/14/17; arrives today with a healthier looking wound bed contractions and the top 50% of this wound and some on the lesser 50%. Wound bed appears healthy. The area between the first and second toe on the right still remains problematic 04/21/17; continued very gradual improvement. Using [facility] 04/28/17; continued very gradual improvement in the left lateral leg venous  insufficiency wound. His periwound erythema is very mild. We have been using Hydrofera Blue. Wound is making progress especially in the superior 50% 05/05/17; he continues to have very gradual improvement in the left lateral venous insufficiency wound. Both in terms with an length rings are improving. I debrided this every 2 weeks with #5 curet and we have been using Hydrofera Blue and again making good progress With regards to the wounds between his right first and second toe which I thought might of been tinea pedis he is not making as much progress very dry scaly skin over the area. Also the area at the base of the left first and second toe in a similar condition 05/12/17; continued gradual improvement in the refractory left lateral venous insufficiency wound on the left. Dimension smaller. Surface still requiring debridement using Hydrofera Blue 05/19/17; continued gradual improvement in the refractory left lateral venous ulceration. Careful inspection of the wound bed underlying rumination suggested some degree of epithelialization over the surface no debridement indicated. Continue Hydrofera Blue difficult areas between his toes first and third on the left than first and second on the right. I'm going to change to silver alginate from silver collagen. Continue ketoconazole as I suspect underlying tinea pedis 05/26/17; left lateral leg venous insufficiency wound. We've been using Hydrofera Blue. I believe that there is expanding epithelialization over the surface of the wound albeit not coming from the wound circumference. This is a bit of an odd situation in which the epithelialization seems to be coming from the surface of the wound rather than in the exact circumference. There is still small open areas mostly along the lateral margin of the wound. ooHe has unchanged areas between the left first and second and the right first second toes which I been treating for tenia pedis 06/02/17; left lateral  leg venous insufficiency wound. We have been using Hydrofera Blue. Somewhat smaller from the wound circumference. The surface of the wound remains a bit on it almost epithelialized sedation in appearance. I use an open curette today debridement in the surface of all of this especially the edges ooSmall open wounds remaining on the dorsal right first and second toe interspace and the plantar left first second toe and her face on the left 06/09/17; wound on the left lateral leg continues to be smaller but very gradual and very dry surface  using Hydrofera Blue 06/16/17 requires weekly debridements now on the left lateral leg although this continues to contract. I changed to silver collagen last week because of dryness of the wound bed. Using Iodoflex to the areas on his first and second toes/web space bilaterally 06/24/17; patient with history of paraplegia also chronic venous insufficiency with lymphedema. Has a very difficult wound on the left lateral leg. This has been gradually reducing in terms of with but comes in with a very dry adherent surface. High switch to silver collagen a week or so ago with hydrogel to keep the area moist. This is been refractory to multiple dressing attempts. He also has areas in his first and second toes bilaterally in the anterior and posterior web space. I had been using Iodoflex here after a prolonged course of silver alginate with ketoconazole was ineffective [question tinea pedis] 07/14/17; patient arrives today with a very difficult adherent material over his left lateral lower leg wound. He also has surrounding erythema and poorly controlled edema. He was switched his Santyl last visit which the nurses are applying once during his doctor visit and once on a nurse visit. He was also reduced to 2 layer compression I'm not exactly sure of the issue here. 07/21/17; better surface today after 1 week of Iodoflex. Significant cellulitis that we treated last week also better.  [Doxycycline] 07/28/17 better surface today with now 2 weeks of Iodoflex. Significant cellulitis treated with doxycycline. He has now completed the doxycycline and he is back to his usual degree of chronic venous inflammation/stasis dermatitis. He reminds me he has had ablations surgery here 08/04/17; continued improvement with Iodoflex to the left lateral leg wound in terms of the surface of the wound although the dimensions are better. 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 Problematic areas on the plantar aspect of the first second toe web space on the left and the dorsal aspect of the first second toe web space on the right. At one point I felt these were probably related to chronic fungal infections in treated him aggressively for this although we have not made any improvement here. 08/11/17; left lateral leg. Surface continues to improve with the Iodoflex although we are not seeing much improvement in overall wound dimensions. Areas on his plantar left foot and right foot show no improvement. In fact the right foot looks somewhat worse 08/18/17; left lateral leg. We changed to [facility] Blue last week after a prolonged course of Iodoflex which helps get the surface better. It appears that the wound with is improved. Continue with difficult areas on the left dorsal first second and plantar first second on the right 09/01/17; patient arrives in clinic today having had a temperature of 103 yesterday. He was seen in the ER and [facility]. The patient was concerned he could Pohle, Alphonse E ([id number]) [phone].pdf Page 23 of 38 have cellulitis again in the right leg however they diagnosed him with a UTI and he is now on Keflex. He has a history of cellulitis which is been recurrent and difficult but this is been in the left leg, in the past 5 use doxycycline. He does in and out catheterizations at home which  are risk factors for UTI 09/08/17; patient will be completing his Keflex this weekend. The erythema on the left leg is considerably better. He has a new wound today on the medial part of the right leg small superficial almost looks like a skin  tear. He has worsening of the area on the right dorsal first and second toe. His major area on the left lateral leg is better. Using Hydrofera Blue on all areas 09/15/17; gradual reduction in width on the long wound in the left lateral leg. No debridement required. He also has wounds on the plantar aspect of his left first second toe web space and on the dorsal aspect of the right first second toe web space. 09/22/17; there continues to be very gradual improvements in the dimensions of the left lateral leg wound. He hasn't round erythematous spot with might be pressure on his wheelchair. There is no evidence obviously of infection no purulence no warmth ooHe has a dry scaled area on the plantar aspect of the left first second toe ooImproved area on the dorsal right first second toe. 09/29/17; left lateral leg wound continues to improve in dimensions mostly with an is still a fairly long but increasingly narrow wound. ooHe has a dry scaled area on the plantar aspect of his left first second toe web space ooIncreasingly concerning area on the dorsal right first second toe. In fact I am concerned today about possible cellulitis around this wound. The areas extending up his second toe and although there is deformities here almost appears to abut on the nailbed. 10/06/17; left lateral leg wound continues to make very gradual progress. Tissue culture I did from the right first second toe dorsal foot last time grew MRSA and enterococcus which was vancomycin sensitive. This was not sensitive to clindamycin or doxycycline. He is allergic to Zyvox and sulfa we have therefore arrange for him to have dalvance infusion tomorrow. He is had this in the past and tolerated it  well 10/20/17; left lateral leg wound continues to make decent progress. This is certainly reduced in terms of with there is advancing epithelialization.ooThe cellulitis in the right foot looks better although he still has a deep wound in the dorsal aspect of the first second toe web space. Plantar left first toe web space on the left I think is making some progress 10/27/17; left lateral leg wound continues to make decent progress. Advancing epithelialization.using Hydrofera Blue ooThe right first second toe web space wound is better-looking using silver alginate ooImprovement in the left plantar first second toe web space. Again using silver alginate 11/03/17 left lateral leg wound continues to make decent progress albeit slowly. Using Hydrofera Blue ooThe right per second toe web space continues to be a very problematic looking punched out wound. I obtained a piece of tissue for deep culture I did extensively treated this for fungus. It is difficult to imagine that this is a pressure area as the patient states other than going outside he doesn't really wear shoes at home ooThe left plantar first second toe web space looked fairly senescent. Necrotic edges. This required debridement oochange to Hydrofera Blue to all wound areas 11/10/17; left lateral leg wound continues to contract. Using Hydrofera Blue ooOn the right dorsal first second toe web space dorsally. Culture I did of this area last week grew MRSA there is not an easy oral option in this patient was multiple antibiotic allergies or intolerances. This was only a rare culture isolate I'm therefore going to use Bactroban under silver alginate ooOn the left plantar first second toe web space. Debridement is required here. This is also unchanged 11/17/17; left lateral leg wound continues to contract using Hydrofera Blue this is no longer the major issue. ooThe major concern here is the  right first second toe web space. He now has an  open area going from dorsally to the plantar aspect. There is now wound on the inner lateral part of the first toe. Not a very viable surface on this. There is erythema spreading medially into the forefoot. ooNo major change in the left first second toe plantar wound 11/24/17; left lateral leg wound continues to contract using Hydrofera Blue. Nice improvement today ooThe right first second toe web space all of this looks a lot less angry than last week. I have given him clindamycin and topical Bactroban for MRSA and terbinafine for the possibility of underlining tinea pedis that I could not control with ketoconazole. Looks somewhat better ooThe area on the plantar left first second toe web space is weeping with dried debris around the wound 12/01/17; left lateral leg wound continues to contract he Hydrofera Blue. It is becoming thinner in terms of with nevertheless it is making good improvement. ooThe right first second toe web space looks less angry but still a large necrotic-looking wounds starting on the plantar aspect of the right foot extending between the toes and now extensively on the base of the right second toe. I gave him clindamycin and topical Bactroban for MRSA anterior benefiting for the possibility of underlying tinea pedis. Not looking better today ooThe area on the left first/second toe looks better. Debrided of necrotic debris 12/05/17* the patient was worked in urgently today because over the weekend he found blood on his incontinence bad when he woke up. He was found to have an ulcer by his wife who does most of his wound care. He came in today for [place] to look at this. He has not had a history of wounds in his buttocks in spite of his paraplegia. 12/08/17; seen in follow-up today at his usual appointment. He was seen earlier this week and found to have a new wound on his buttock. We also follow him for wounds on the left lateral leg, left first second toe web space and right first  second toe web space 12/15/17; we have been using Hydrofera Blue to the left lateral leg which has improved. The right first second toe web space has also improved. Left first second toe web space plantar aspect looks stable. The left buttock has worsened using Santyl. Apparently the buttock has drainage 12/22/17; we have been using Hydrofera Blue to the left lateral leg which continues to improve now 2 small wounds separated by normal skin. He tells [place] he had a fever up to 100 yesterday he is prone to UTIs but has not noted anything different. He does in and out catheterizations. The area between the first and second toes today does not look good necrotic surface covered with what looks to be purulent drainage and erythema extending into the third toe. I had gotten this to something that I thought look better last time however it is not look good today. He also has a necrotic surface over the buttock wound which is expanded. I thought there might be infection under here so I removed a lot of the surface with a #5 curet though nothing look like it really needed culturing. He is been using Santyl to this area 12/27/17; his original wound on the left lateral leg continues to improve using Hydrofera Blue. I gave him samples of Baxdella although he was unable to take them out of fear for an allergic reaction ["lump in his throat"].the culture I did of the purulent drainage from  his second toe last week showed both enterococcus and a set Enterobacter I was also concerned about the erythema on the bottom of his foot although paradoxically although this looks somewhat better today. 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 We've been using silver alginate here. They came up today that he sleeps in a recliner, I'm not sure why but I asked him to stop this 01/03/18; his original wound we've been using Hydrofera Blue is now  separated into 2 areas. ooUlcer on his left buttock is better he is off the recliner and sleeping in bed ooFinally both wound areas between his first and second toes also looks some better 01/10/18; his original wound on the left lateral leg is now separated into 2 wounds we've been using Hydrofera Blue ooUlcer on his left buttock has some drainage. There is a small probing site going into muscle layer superiorly.using silver alginate -He arrives today with a deep tissue injury on the left heel ooThe wound on the dorsal aspect of his first second toe on the left looks a lot betterusing silver alginate ketoconazole ooThe area on the first second toe web space on the right also looks a lot bette 01/17/18; his original wound on the left lateral leg continues to progress using Hydrofera Blue ooUlcer on his left buttock also is smaller surface healthier except for a small probing site going into the muscle layer superiorly. 2.4 cm of tunneling in this area ooDTI on his left heel we have only been offloading. Looks better than last week no threatened open no evidence of infection oothe wound on the dorsal aspect of the first second toe on the left continues to look like it's regressing we have only been using silver alginate and terbinafine orally ooThe area in the first second toe web space on the right also looks to be a lot better using silver alginate and terbinafine I think this was prompted by tinea pedis 01/31/18; the patient was hospitalized in [place] last week apparently for a complicated UTI. He was discharged on cefepime he does in and out catheterizations. In the hospital he was discovered M I don't mild elevation of AST and ALT and the terbinafine was stopped.predictably the pressure ulcer on s his buttock looks betterusing silver alginate. The area on the left lateral leg also is better using Hydrofera Blue. The area between the first and second toes on the left better. First and  second toes on the right still substantial but better. Finally the DTI on the left heel has held together and looks like it's resolving 02/07/18-he is here in follow-up evaluation for multiple ulcerations. He has new injury to the lateral aspect of the last issue a pressure ulcer, he states this is Biller, Jerman E ([id number]) [phone].pdf Page 24 of 38 from adhesive removal trauma. He states he has tried multiple adhesive products with no success. All other ulcers appear stable. The left heel DTI is resolving. We will continue with same treatment plan and follow-up next week. 02/14/18; follow-up for multiple areas. ooHe has a new area last week on the lateral aspect of his pressure ulcer more over the posterior trochanter. The original pressure ulcer looks quite stable has healthy granulation. 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 Almost fully epithelialized on the original superior area using Hydrofera Blue ooDTI on  the left heel has peeled off this week to reveal a small superficial wound under denuded skin and subcutaneous tissue ooBoth areas between the first and second toes look better including nothing open on the left 02/21/18; ooThe patient's wounds on his left ischial tuberosity and posterior left greater trochanter actually looked better. He has a large area of irritation around the area which I think is contact dermatitis. I am doubtful that this is fungal ooHis original wound on the left lateral calf continues to improve we have been using Hydrofera Blue ooThere is no open area in the left first second toe web space although there is a lot of thick callus ooThe DTI on the left heel required debridement today of necrotic surface eschar and subcutaneous tissue using silver alginate ooFinally the area on the right first second toe webspace continues  to contract using silver alginate and ketoconazole 02/28/18 ooLeft ischial tuberosity wounds look better using silver alginate. ooOriginal wound on the left calf only has one small open area left using Hydrofera Blue ooDTI on the left heel required debridement mostly removing skin from around this wound surface. Using silver alginate ooThe areas on the right first/second toe web space using silver alginate and ketoconazole 03/08/18 on evaluation today patient appears to be doing decently well as best I can tell in regard to his wounds. This is the first time that I have seen him as he generally is followed by Dr. Dellia Nims. With that being said none of his wounds appear to be infected he does have an area where there is some skin covering what appears to be a new wound on the left dorsal surface of his great toe. This is right at the nail bed. With that being said I do believe that debrided away some of the excess skin can be of benefit in this regard. Otherwise he has been tolerating the dressing changes without complication. 03/14/18; patient arrives today with the multiplicity of wounds that we are following. He has not been systemically unwell ooOriginal wound on the left lateral calf now only has 2 small open areas we've been using Hydrofera Blue which should continue ooThe deep tissue injury on the left heel requires debridement today. We've been using silver alginate ooThe left first second toe and the right first second toe are both are reminiscence what I think was tinea pedis. Apparently some of the callus Surface between the toes was removed last week when it started draining. ooPurulent drainage coming from the wound on the ischial tuberosity on the left. 03/21/18-He is here in follow-up evaluation for multiple wounds. 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 He tolerated debridement. The only change to last  week's recommendations is to discontinue antifungal cream between toes. He will follow-up next week 03/28/18; following up for multiple wounds;Concern this week is streaking redness and swelling in the right foot. He is going to need antibiotics for this. 03/31/18; follow-up for right foot cellulitis. Streaking redness and swelling in the right foot on 03/28/18. 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 I put him on clindamycin 300 mg every 6 and brought him in for a quick check. He has an open wound between his first and second toes on the right foot as a potential source. 04/04/18; ooRight foot cellulitis is resolving he is completing clindamycin. This is truly good news ooLeft lateral calf wound which is initial wound only has one small open area inferiorly  this is close to healing out. He has compression stockings. We will use Hydrofera Blue right down to the epithelialization of this ooNonviable surface on the left heel which was initially pressure with a DTI. We've been using Hydrofera Blue. I'm going to switch this back to silver alginate ooLeft first second toe/tinea pedis this looks better using silver alginate ooRight first second toe tinea pedis using silver alginate ooLarge pressure ulcers on theLeft ischial tuberosity. Small wound here Looks better. I am uncertain about the surface over the large wound. Using silver alginate 04/11/18; ooCellulitis in the right foot is resolved ooLeft lateral calf wound which was his original wounds still has 2 tiny open areas remaining this is just about closed ooNonviable surface on the left heel is better but still requires debridement ooLeft first second toe/tinea pedis still open using silver alginate ooRight first second toe wound tinea pedis I asked him to go back to using ketoconazole and silver alginate ooLarge pressure ulcers on the left ischial tuberosity this shear injury here is resolved. Wound is smaller. No  evidence of infection using silver alginate 04/18/18; ooPatient arrives with an intense area of cellulitis in the right mid lower calf extending into the right heel area. Bright red and warm. Smaller area on the left anterior leg. He has a significant history of MRSA. He will definitely need antibioticsoodoxycycline ooHe now has 2 open areas on the left ischial tuberosity the original large wound and now a satellite area which I think was above his initial satellite areas. Not a wonderful surface on this satellite area surrounding erythema which looks like pressure related. ooHis left lateral calf wound again his original wound is just about closed ooLeft heel pressure injury still requiring debridement ooLeft first second toe looks a lot better using silver alginate ooRight first second toe also using silver alginate and ketoconazole cream also looks better 04/20/18; the patient was worked in early today out of concerns with his cellulitis on the right leg. I had started him on doxycycline. This was 2 days ago. His wife was concerned about the swelling in the area. Also concerned about the left buttock. He has not been systemically unwell no fever chills. No nausea vomiting or diarrhea 04/25/18; the patient's left buttock wound is continued to deteriorate he is using Hydrofera Blue. He is still completing clindamycin for the cellulitis on the right leg although all of this looks better. 05/02/18 ooLeft buttock wound still with a lot of drainage and a very tightly adherent fibrinous necrotic surface. He has a deeper area superiorly ooThe left lateral calf wound is still closed ooDTI wound on the left heel necrotic surface especially the circumference using Iodoflex ooAreas between his left first second toe and right first second toe both look better. Dorsally and the right first second toe he had a necrotic surface although at smaller. In using silver alginate and ketoconazole. I did a  culture last week which was a deep tissue culture of the reminiscence of the open wound on the right first second toe dorsally. This grew a few Acinetobacter and a few methicillin-resistant staph aureus. Nevertheless the area actually this week looked better. I didn't feel the need to specifically address this at least in terms of systemic antibiotics. 05/09/18; wounds are measuring larger more drainage per our intake. We are using Santyl covered with alginate on the large superficial buttock wounds, Iodosorb on the left heel, ketoconazole and silver alginate to the dorsal first and second toes bilaterally. 05/16/18; ooThe area  on his left buttock better in some aspects although the area superiorly over the ischial tuberosity required an extensive debridement.using Santyl ooLeft heel appears stable. Using Iodoflex ooThe areas between his first and second toes are not bad however there is spreading erythema up the dorsal aspect of his left foot this looks like cellulitis again. He is insensate the erythema is really very brilliant.o Erysipelas He went to see an allergist days ago because he was itching part of this he had lab work done. This showed a white count of 15.1 with 70% neutrophils. Hemoglobin of 11.4 and a platelet count of 659,000. Last white count we had in Epic was a 2-1/2 years ago which was 25.9 but he was ill at the time. He was able to show me some lab work that was done by his primary physician the pattern is about the same. 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 But prompted me to go ahead and do x-rays of both feet and the pelvis rule out osteomyelitis. He also had a comprehensive metabolic panel this was reasonably normal his albumin was 3.7 liver function tests BUN/creatinine all normal Erby, Marquist E ([id number]) [phone].pdf Page 25 of 38 05/23/18; x-rays of both his feet from last week were negative for  underlying pulmonary abnormality. The x-ray of his pelvis however showed mild irregularity in the left ischial which may represent some early osteomyelitis. The wound in the left ischial continues to get deeper clearly now exposed muscle. Each week necrotic surface material over this area. Whereas the rest of the wounds do not look so bad. ooThe left ischial wound we have been using Santyl and calcium alginate ooT the left heel surface necrotic debris using Iodoflex o ooThe left lateral leg is still healed ooAreas on the left dorsal foot and the right dorsal foot are about the same. There is some inflammation on the left which might represent contact dermatitis, fungal dermatitis I am doubtful cellulitis although this looks better than last week 05/30/18; CT scan done at Hospital did not show any osteomyelitis or abscess. Suggested the possibility of underlying cellulitis although I don't see a lot of evidence of this at the bedside ooThe wound itself on the left buttock/upper thigh actually looks somewhat better. No debridement ooLeft heel also looks better no debridement continue Iodoflex ooBoth dorsal first second toe spaces appear better using Lotrisone. Left still required debridement 06/06/18; ooIntake reported some purulent looking drainage from the left gluteal wound. Using Santyl and calcium alginate ooLeft heel looks better although still a nonviable surface requiring debridement ooThe left dorsal foot first/second webspace actually expanding and somewhat deeper. I may consider doing a shave biopsy of this area ooRight dorsal foot first/second webspace appears stable to improved. Using Lotrisone and silver alginate to both these areas 06/13/18 ooLeft gluteal surface looks better. Now separated in the 2 wounds. No debridement required. Still drainage. We'll continue silver alginate ooLeft heel continues to look better with Iodoflex continue this for at least another week ooOf  his dorsal foot wounds the area on the left still has some depth although it looks better than last week. We've been using Lotrisone and silver alginate 06/20/18 ooLeft gluteal continues to look better healthy tissue ooLeft heel continues to look better healthy granulation wound is smaller. He is using Iodoflex and his long as this continues continue the Iodoflex ooDorsal right foot looks better unfortunately dorsal left foot does not. There is swelling and erythema  of his forefoot. He had minor trauma to this several days ago but doesn't think this was enough to have caused any tissue injury. Foot looks like cellulitis, we have had this problem before 06/27/18 on evaluation today patient appears to be doing a little worse in regard to his foot ulcer. 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 Both of which would really be his only options for treating this infection. In the past he has been given and effusion of Orbactiv. This is done very well for him in the past again it's one time dosing IV antibiotic therapy. Subsequently I do believe this is something we're gonna need to see about doing at this point in time. Currently his other wounds seem to be doing somewhat better in my pinion I'm pretty happy in that regard. 07/03/18 on evaluation today patient's wounds actually appear to be doing fairly well. He has been tolerating the dressing changes without complication. All in all he seems to be showing signs of improvement. In regard to the antibiotics he has been dealing with infectious disease since I saw him last week as far as getting this scheduled. In the end [facility]'s [facility] this coming Friday. In the meantime he has been continuing to perform the dressing changes in such as previous. There does not  appear to be any evidence of infection worsengin at this time. 07/10/18; ooSince I last saw this man 2 weeks ago things have actually improved. IV antibiotics of resulted in less forefoot erythema although there is still some present. He is not systemically unwell ooLeft buttock wounds o2 now have no depth there is increased epithelialization Using silver alginate ooLeft heel still requires debridement using Iodoflex ooLeft dorsal foot still with a sizable wound about the size of a border but healthy granulation ooRight dorsal foot still with a slitlike area using silver alginate 07/18/18; the patient's cellulitis in the left foot is improved in fact I think it is on its way to resolving. ooLeft buttock wounds o2 both look better although the larger one has hypertension granulation we've been using silver alginate ooLeft heel has some thick circumferential redundant skin over the wound edge which will need to be removed today we've been using Iodoflex ooLeft dorsal foot is still a sizable wound required debridement using silver alginate ooThe right dorsal foot is just about closed only a small open area remains here 07/25/18; left foot cellulitis is resolved ooLeft buttock wounds o2 both look better. Hyper-granulation on the major area ooLeft heel as some debris over the surface but otherwise looks a healthier wound. Using silver collagen ooRight dorsal foot is just about closed 07/31/18; arrives with our intake nurse worried about purulent drainage from the buttock. We had hyper-granulation here last week ooHis buttock wounds o2 continue to look better ooLeft heel some debris over the surface but measuring smaller. ooRight dorsal foot unfortunately has openings between the toes ooLeft foot superficial wound looks less aggravated. 08/07/18 ooButtock wounds continue to look better although some of her granulation and the larger medial wound. silver alginate ooLeft heel continues  to look a lot better.silver collagen ooLeft foot superficial wound looks less stable. Requires debridement. He has a new wound superficial area on the foot on the lateral dorsal foot. ooRight foot looks better using silver  alginate without Lotrisone 08/14/2018; patient was in the ER last week diagnosed with a UTI. He is now on Cefpodoxime and Macrodantin. ooButtock wounds continued to be smaller. Using silver alginate ooLeft heel continues to look better using silver collagen ooLeft foot superficial wound looks as though it is improving ooRight dorsal foot area is just about healed. 08/21/2018; patient is completed his antibiotics for his UTI. ooHe has 2 open areas on the buttocks. There is still not closed although the surface looks satisfactory. Using silver alginate ooLeft heel continues to improve using silver collagen ooThe bilateral dorsal foot areas which are at the base of his first and second toes/possible tinea pedis are actually stable on the left but worse on the right. The area on the left required debridement of necrotic surface. After debridement I obtained a specimen for PCR culture. ooThe right dorsal foot which is been just about healed last week is now reopened 08/28/2018; culture done on the left dorsal foot showed coag negative staph both staph epidermidis and Lugdunensis. I think this is worthwhile initiating systemic treatment. I will use doxycycline given his long list of allergies. The area on the left heel slightly improved but still requiring debridement. ooThe large wound on the buttock is just about closed whereas the smaller one is larger. Using silver alginate in this area 09/04/2018; patient is completing his doxycycline for the left foot although this continues to be a very difficult wound area with very adherent necrotic debris. We are using silver alginate to all his wounds right foot left foot and the small wounds on his buttock, silver collagen on the  left heel. 09/11/2018; once again this patient has intense erythema and swelling of the left forefoot. Lesser degrees of erythema in the right foot. He has a long list of allergies and intolerances. I will reinstitute doxycycline. 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 Using silver alginate ooLeft heel also looks better using silver collagen ooUnfortunately both the areas on his feet look worse. The area on the left first second webspace is now gone through to the plantar part of his foot. The area on the left foot anteriorly is irritated with erythema and swelling in the forefoot. 09/25/2018 ooHis wound on the left plantar heel looks better. Using silver collagen ooThe area on the left buttock 2 small remnant areas. One is closed one is still open. Using silver alginate Scarano, Keeyon E ([id number]) [phone].pdf Page 26 of 70 ooThe areas between both his first and second toes look worse. This in spite of long-standing antifungal therapy with ketoconazole and silver alginate which should have antifungal activity ooHe has small areas around his original wound on the left calf one is on the bottom of the original scar tissue and one superiorly both of these are small and superficial but again given wound history in this site this is worrisome 10/02/2018 ooLeft plantar heel continues to gradually contract using silver collagen ooLeft buttock wound is unchanged using silver alginate ooThe areas on his dorsal feet between his first and second toes bilaterally look about the same. 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 Small open area here. We have been using silver alginate 10/09/2018; ooLeft plantar heel continues to look healthy  and contract. No debridement is required ooLeft buttock slightly  smaller with a tape injury wound just below which was new this week ooDorsal feet somewhat improved I have been using clindamycin ooLeft lateral looks lower extremity the actual open area looks worse although a lot of this is epithelialized. I am going to change to silver collagen today He has a lot more swelling in the right leg although this is not pitting not red and not particularly warm there is a lot of spasm in the right leg usually indicative of people with paralysis of some underlying discomfort. We have reviewed his vascular status from 2017 he had a left greater saphenous vein ablation. I wonder about referring him back to vascular surgery if the area on the left leg continues to deteriorate. 10/16/2018 in today for follow-up and management of multiple lower extremity ulcers. His left Buttock wound is much lower smaller and almost closed completely. The wound to the left ankle has began to reopen with Epithelialization and some adherent slough. He has multiple new areas to the left foot and leg. The left dorsal foot without much improvement. Wound present between left great webspace and 2nd toe. Erythema and edema present right leg. Right LE ultrasound obtained on 10/10/18 was negative for DVT . 10/23/2018; ooLeft buttock is closed over. Still dry macerated skin but there is no open wound. I suspect this is chronic pressure/moisture ooLeft lateral calf is quite a bit worse than when I saw this last. There is clearly drainage here he has macerated skin into the left plantar heel. We will change the primary dressing to alginate ooLeft dorsal foot has some improvement in overall wound area. Still using clindamycin and silver alginate ooRight dorsal foot about the same as the left using clindamycin and silver alginate ooThe erythema in the right leg has resolved. He is DVT rule out was negative ooLeft heel pressure  area required debridement although the wound is smaller and the surface is health 10/26/2018 ooThe patient came back in for his nurse check today predominantly because of the drainage coming out of the left lateral leg with a recent reopening of his original wound on the left lateral calf. He comes in today with a large amount of surrounding erythema around the wound extending from the calf into the ankle and even in the area on the dorsal foot. He is not systemically unwell. He is not febrile. Nevertheless this looks like cellulitis. We have been using silver alginate to the area. I changed him to a regular visit and I am going to prescribe him doxycycline. The rationale here is a long list of medication intolerances and a history of MRSA. I did not see anything that I thought would provide a valuable culture 10/30/2018 ooFollow-up from his appointment 4 days ago with really an extensive area of cellulitis in the left calf left lateral ankle and left dorsal foot. I put him on doxycycline. He has a long list of medication allergies which are true allergy reactions. 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 In any case he is a lot better today. The erythema especially in the anterior and lateral left calf is better. He still has left ankle erythema. He also is complaining about increasing edema in the right leg we have only been using Kerlix Coban and he has been doing the wraps at home. Finally he has a spotty rash on the medial part of his upper left calf which looks like folliculitis or perhaps wrap occlusion  type injury. Small superficial macules not pustules 11/06/18 patient arrives today with again a considerable degree of erythema around the wound on the left lateral calf extending into the dorsal ankle and dorsal foot. This is a lot worse than when I saw this last week. He is on doxycycline really with not a lot of  improvement. He has not been systemically unwell Wounds on the; left heel actually looks improved. Original area on the left foot and proximity to the first and second toes looks about the same. He has superficial areas on the dorsal foot, anterior calf and then the reopening of his original wound on the left lateral calf which looks about the same ooThe only area he has on the right is the dorsal webspace first and second which is smaller. ooHe has a large area of dry erythematous skin on the left buttock small open area here. 11/13/2018; the patient arrives in much better condition. The erythema around the wound on the left lateral calf is a lot better. Not sure whether this was the clindamycin or the TCA and ketoconazole or just in the improvement in edema control [stasis dermatitis]. In any case this is a lot better. The area on the left heel is very small and just about resolved using silver collagen we have been using silver alginate to the areas on his dorsal feet 11/20/2018; his wounds include the left lateral calf, left heel, dorsal aspects of both feet just proximal to the first second webspace. He is stable to slightly improved. I did not think any changes to his dressings were going to be necessary 11/27/2018 he has a reopening on the left buttock which is surrounded by what looks like tinea or perhaps some other form of dermatitis. The area on the left dorsal foot has some erythema around it I have marked this area but I am not sure whether this is cellulitis or not. Left heel is not closed. Left calf the reopening is really slightly longer and probably worse 1/13; in general things look better and smaller except for the left dorsal foot. Area on the left heel is just about closed, left buttock looks better only a small wound remains in the skin looks better [using Lotrisone] 1/20; the area on the left heel only has a few remaining open areas here. Left lateral calf about the same in  terms of size, left dorsal foot slightly larger right lateral foot still not closed. The area on the left buttock has no open wound and the surrounding skin looks a lot better 1/27; the area on the left heel is closed. Left lateral calf better but still requiring extensive debridements. The area on his left buttock is closed. He still has the open areas on the left dorsal foot which is slightly smaller in the right foot which is slightly expanded. We have been using Iodoflex on these areas as well 2/3; left heel is closed. Left lateral calf still requiring debridement using Iodoflex there is no open area on his left buttock however he has dry scaly skin over a large area of this. Not really responding well to the Lotrisone. Finally the areas on his dorsal feet at the level of the first second webspace are slightly smaller on the right and about the same on the left. Both of these vigorously debrided with Anasept and gauze 2/10; left heel remains closed he has dry erythematous skin over the left buttock but there is no open wound here. Left lateral leg has  come in and with. Still requiring debridement we have been using Iodoflex here. Finally the area on the left dorsal foot and right dorsal foot are really about the same extremely dry callused fissured areas. He does not yet have a dermatology appointment 2/17; left heel remains closed. He has a new open area on the left buttock. The area on the left lateral calf is bigger longer and still covered in necrotic debris. No major change in his foot areas bilaterally. I am awaiting for a dermatologist to look on this. We have been using ketoconazole I do not know that this is been doing any good at all. 2/24; left heel remains closed. The left buttock wound that was new reopening last week looks better. The left lateral calf appears better also although still requires debridement. The major area on his foot is the left first second also requiring  debridement. We have been putting Prisma on all wounds. I do not believe that the ketoconazole has done too much good for his feet. He will use Lotrisone I am going to give him a 2-week course of terbinafine. We still do not have a dermatology appointment 3/2 left heel remains closed however there is skin over bone in this area I pointed this out to him today. The left buttock wound is epithelialized but still does not look completely stable. The area on the left leg required debridement were using silver collagen here. With regards to his feet we changed to Lotrisone last week and silver alginate. 3/9; left heel remains closed. Left buttock remains closed. The area on the right foot is essentially closed. The left foot remains unchanged. Slightly smaller on the left lateral calf. Using silver collagen to both of these areas 3/16-Left heel remains closed. Area on right foot is closed. Left lateral calf above the lateral malleolus open wound requiring debridement with easy bleeding. Left dorsal wound proximal to first toe also debrided. Left ischial area open new. Patient has been using Prisma with wrapping every 3 days. Dermatology appointment is apparently tomorrow.Patient has completed his terbinafine 2-week course with some apparent improvement according to him, there is still flaking and dry skin in his foot on the left 3/23; area on the right foot is reopened. The area on the left anterior foot is about the same still a very necrotic adherent surface. He still has the area on the Sapien, Curtice E ([id number]) [phone].pdf Page 27 of 38 left leg and reopening is on the left buttock. He apparently saw dermatology although I do not have a note. According to the patient who is usually fairly well informed they did not have any good ideas. Put him on oral terbinafine which she is been on before. 3/30; using silver collagen to all wounds. Apparently his dermatologist put him on  doxycycline and rifampin presumably some culture grew staph. I do not have this result. He remains on terbinafine although I have used terbinafine on him before 4/6; patient has had a fairly substantial reopening on the right foot between the first and second toes. He is finished his terbinafine and I believe is on doxycycline and rifampin still as prescribed by dermatology. We have been using silver collagen to all his wounds although the patient reports that he thinks silver alginate does better on the wounds on his buttock. 4/13; the area on his left lateral calf about the same size but it did not require debridement. ooLeft dorsal foot just proximal to the webspace between the first and second  toes is about the same. Still nonviable surface. I note some superficial bronze discoloration of the dorsal part of his foot ooRight dorsal foot just proximal to the first and second toes also looks about the same. I still think there may be the same discoloration I noted above on the left ooLeft buttock wound looks about the same 4/20; left lateral calf appears to be gradually contracting using silver collagen. 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 The left dorsal foot wound is debrided of tightly adherent necrotic debris and really cleans up quite nicely. The right area is worse with expansion. I did not debride this it is now over the base of the second toe ooThe area on his left buttock is smaller no debridement is required using silver collagen 5/4; left calf continues to make good progress. ooHe arrives with erythema around the wounds on his dorsal foot which even extends to the plantar aspect. Very concerning for coexistent infection. He is finished the erythromycin I gave him for possible erythrasma this does not seem to have helped. ooThe area on the left foot is about the same base of the dorsal toes ooIs area on the buttock  looks improved on the left 5/11; left calf and left buttock continued to make good progress. Left foot is about the same to slightly improved. ooMajor problem is on the right foot. He has not had an x-ray. Deep tissue culture I did last week showed both Enterobacter and E. coli. I did not change the doxycycline I put him on empirically although neither 1 of these were plated to doxycycline. He arrives today with the erythema looking worse on both the dorsal and plantar foot. Macerated skin on the bottom of the foot. he has not been systemically unwell 5/18-Patient returns at 1 week, left calf wound appears to be making some progress, left buttock wound appears slightly worse than last time, left foot wound looks slightly better, right foot redness is marginally better. X-ray of both feet show no air or evidence of osteomyelitis. Patient is finished his Omnicef and terbinafine. He continues to have macerated skin on the bottom of the left foot as well as right 5/26; left calf wound is better, left buttock wound appears to have multiple small superficial open areas with surrounding macerated skin. X-rays that I did last time showed no evidence of osteomyelitis in either foot. He is finished cefdinir and doxycycline. I do not think that he was on terbinafine. He continues to have a large superficial open area on the right foot anterior dorsal and slightly between the first and second toes. I did send him to dermatology 2 months ago or so wondering about whether they would do a fungal scraping. I do not believe they did but did do a culture. We have been using silver alginate to the toe areas, he has been using antifungals at home topically either ketoconazole or Lotrisone. We are using silver collagen on the left foot, silver alginate on the right, silver collagen on the left lateral leg and silver alginate on the left buttock 6/1; left buttock area is healed. We have the left dorsal foot, left lateral  leg and right dorsal foot. We are using silver alginate to the areas on both feet and silver collagen to the area on his left lateral calf 6/8; the left buttock apparently reopened late last week. He is not really sure how this happened. He is tolerating the terbinafine. Using silver alginate  to all wounds 6/15; left buttock wound is larger than last week but still superficial. ooCame in the clinic today with a report of purulence from the left lateral leg I did not identify any infection ooBoth areas on his dorsal feet appear to be better. He is tolerating the terbinafine. Using silver alginate to all wounds 6/22; left buttock is about the same this week, left calf quite a bit better. His left foot is about the same however he comes in with erythema and warmth in the right forefoot once again. Culture that I gave him in the beginning of May showed Enterobacter and E. coli. I gave him doxycycline and things seem to improve although neither 1 of these organisms was specifically plated. 6/29; left buttock is larger and dry this week. Left lateral calf looks to me to be improved. Left dorsal foot also somewhat improved right foot completely unchanged. The erythema on the right foot is still present. He is completing the Ceftin dinner that I gave him empirically [see discussion above.) 7/6 - All wounds look to be stable and perhaps improved, the left buttock wound is slightly smaller, per patient bleeds easily, completed ceftin, the right foot redness is less, he is on terbinafine 7/13; left buttock wound about the same perhaps slightly narrower. Area on the left lateral leg continues to narrow. Left dorsal foot slightly smaller right foot about the same. We are using silver alginate on the right foot and Hydrofera Blue to the areas on the left. Unna boot on the left 2 layer compression on the right 7/20; left buttock wound absolutely the same. Area on lateral leg continues to get better. Left dorsal  foot require debridement as did the right no major change in the 7/27; left buttock wound the same size necrotic debris over the surface. The area on the lateral leg is closed once again. His left foot looks better right foot about the same although there is some involvement now of the posterior first second toe area. He is still on terbinafine which I have given him for a month, not certain a centimeter major change 06/25/19-All wounds appear to be slightly improved according to report, left buttock wound looks clean, both foot wounds have minimal to no debris the right dorsal foot has minimal slough. We are using Hydrofera Blue to the left and silver alginate to the right foot and ischial wound. 8/10-Wounds all appear to be around the same, the right forefoot distal part has some redness which was not there before, however the wound looks clean and small. Ischial wound looks about the same with no changes 8/17; his wound on the left lateral calf which was his original chronic venous insufficiency wound remains closed. Since I last saw him the areas on the left dorsal foot right dorsal foot generally appear better but require debridement. The area on his left initial tuberosity appears somewhat larger to me perhaps hyper granulated and bleeds very easily. We have been using Hydrofera Blue to the left dorsal foot and silver alginate to everything else 8/24; left lateral calf remains closed. The areas on his dorsal feet on the webspace of the first and second toes bilaterally both look better. The area on the left buttock which is the pressure ulcer stage II slightly smaller. I change the dressing to Hydrofera Blue to all areas 8/31; left lateral calf remains closed. The area on his dorsal feet bilaterally look better. Using Hydrofera Blue. Still requiring debridement on the left foot. No change  in the left buttock pressure ulcers however 9/14; left lateral calf remains closed. Dorsal feet look quite a  bit better than 2 weeks ago. Flaking dry skin also a lot better with the ammonium lactate I gave him 2 weeks ago. The area on the left buttock is improved. He states that his Roho cushion developed a leak and he is getting a new one, in the interim he is offloading this vigorously 9/21; left calf remains closed. Left heel which was a possible DTI looks better this week. He had macerated tissue around the left dorsal foot right foot looks satisfactory and improved left buttock wound. I changed his dressings to his feet to silver alginate bilaterally. Continuing Hydrofera Blue on the left buttock. 9/28 left calf remains closed. Left heel did not develop anything [possible DTI] dry flaking skin on the left dorsal foot. Right foot looks satisfactory. Improved left buttock wound. We are using silver alginate on his feet Hydrofera Blue on the buttock. I have asked him to go back to the Lotrisone on his feet including the wounds and surrounding areas 10/5; left calf remains closed. The areas on the left and right feet about the same. A lot of this is epithelialized however debris over the remaining open areas. He is using Lotrisone and silver alginate. The area on the left buttock using Hydrofera Blue 10/26. Patient has been out for 3 weeks secondary to Covid concerns. He tested negative but I think his wife tested positive. He comes in today with the left foot substantially worse, right foot about the same. Even more concerning he states that the area on his left buttock closed over but then reopened and is considerably deeper in one aspect than it was before [stage III wound] 11/2; left foot really about the same as last week. Quarter sized wound on the dorsal foot just proximal to the first second toes. Surrounding erythema with areas of denuded epithelium. This is not really much different looking. Did not look like cellulitis this time however. ooRight foot area about the same.. We have been using  silver alginate alginate on his toes ooLeft buttock still substantial irritated skin around the wound which I think looks somewhat better. We have been using Hydrofera Blue here. 11/9; left foot larger than last week and a very necrotic surface. Right foot I think is about the same perhaps slightly smaller. Debris around the circumference Teti, Joe E ([id number]) [phone].pdf Page 28 of 38 also addressed. Unfortunately on the left buttock there is been a decline. Satellite lesions below the major wound distally and now a an additional one posteriorly we have been using Hydrofera Blue but I think this is a pressure issue 11/16; left foot ulcer dorsally again a very adherent necrotic surface. Right foot is about the same. Not much change in the pressure ulcer on his left buttock. 11/30; left foot ulcer dorsally basically the same as when I saw him 2 weeks ago. Very adherent fibrinous debris on the wound surface. Patient reports a lot of drainage as well. 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 We have been using Iodoflex, patient changed back to alginate because of the drainage. Area on his right dorsal foot really looks benign with a healthier surface certainly a lot better than on the left. Left buttock wounds all improved using Hydrofera Blue 12/7; left dorsal foot again no improvement. Tightly adherent debris. PCR culture I did last week only showed likely skin contaminant. I  have gone ahead and done a punch biopsy of this which is about the last thing in terms of investigations I can think to do. He has known venous insufficiency and venous hypertension and this could be the issue here. The area on the right foot is about the same left buttock slightly worse according to our intake nurse secondary to [facility] Blue sticking to the wound 12/14; biopsy of the left foot that I did last time showed changes that could be  related to wound healing/chronic stasis dermatitis phenomenon no neoplasm. We have been using silver alginate to both feet. I change the one on the left today to Sorbact and silver alginate to his other 2 wounds 12/28; the patient arrives with the following problems; ooMajor issue is the dorsal left foot which continues to be a larger deeper wound area. Still with a completely nonviable surface ooParadoxically the area mirror image on the right on the right dorsal foot appears to be getting better. ooHe had some loss of dry denuded skin from the lower part of his original wound on the left lateral calf. Some of this area looked a little vulnerable and for this reason we put him in wrap that on this side this week ooThe area on his left buttock is larger. He still has the erythematous circular area which I think is a combination of pressure, sweat. This does not look like cellulitis or fungal dermatitis 11/26/2019; -Dorsal left foot large open wound with depth. Still debris over the surface. Using Sorbact ooThe area on the dorsal right foot paradoxically has closed over [facility] has a reopening on the left ankle laterally at the base of his original wound that extended up into the calf. This appears clean. ooThe left buttock wound is smaller but with very adherent necrotic debris over the surface. We have been using silver alginate here as well The patient had arterial studies done in 2017. He had biphasic waveforms at the dorsalis pedis and posterior tibial bilaterally. ABI in the left was 1.17. Digit waveforms were dampened. He has slight spasticity in the great toes I do not think a TBI would be possible 1/11; the patient comes in today with a sizable reopening between the first and second toes on the right. This is not exactly in the same location where we have been treating wounds previously. According to our intake nurse this was actually fairly deep but 0.6 cm. The area on the left dorsal  foot looks about the same the surface is somewhat cleaner using Sorbact, his MRI is in 2 days. We have not managed yet to get arterial studies. The new reopening on the left lateral calf looks somewhat better using alginate. The left buttock wound is about the same using alginate 1/18; the patient had his ARTERIAL studies which were quite normal. ABI in the right at 1.13 with triphasic/biphasic waveforms on the left ABI 1.06 again with triphasic/biphasic waveforms. It would not have been possible to have done a toe brachial index because of spasticity. We have been using Sorbac to the left foot alginate to the rest of his wounds on the right foot left lateral calf and left buttock 1/25; arrives in clinic with erythema and swelling of the left forefoot worse over the first MTP area. This extends laterally dorsally and but also posteriorly. Still has an area on the left lateral part of the lower part of his calf wound it is eschared and clearly not closed. ooArea on the left buttock still with  surrounding irritation and erythema. ooRight foot surface wound dorsally. The area between the right and first and second toes appears better. 2/1; ooThe left foot wound is about the same. Erythema slightly better I gave him a week of doxycycline empirically ooRight foot wound is more extensive extending between the toes to the plantar surface ooLeft lateral calf really no open surface on the inferior part of his original wound however the entire area still looks vulnerable ooAbsolutely no improvement in the left buttock wound required debridement. 2/8; the left foot is about the same. Erythema is slightly improved I gave him clindamycin last week. ooRight foot looks better he is using Lotrimin and silver alginate ooHe has a breakdown in the left lateral calf. Denuded epithelium which I have removed ooLeft buttock about the same were using Hydrofera Blue 2/15; left foot is about the same there is less  surrounding erythema. Surface still has tightly adherent debris which I have debriding however not making any progress ooRight foot has a substantial wound on the medial right second toe between the first and second webspace. ooStill an open area on the left lateral calf distal area. ooButtock wound is about the same 2/22; left foot is about the same less surrounding erythema. Surface has adherent debris. Polymen Ag Right foot area significant wound between the first and second toes. We have been using silver alginate here Left lateral leg polymen Ag at the base of his original venous insufficiency wound ooLeft buttock some improvement here 3/1; ooRight foot is deteriorating in the first second toe webspace. Larger and more substantial. We have been using silver alginate. ooLeft dorsal foot about the same markedly adherent surface debris using PolyMem Ag ooLeft lateral calf surface debris using PolyMem AG ooLeft buttock is improved again using PolyMem Ag. ooHe is completing his terbinafine. The erythema in the foot seems better. He has been on this for 2 weeks 3/8; no improvement in any wound area in fact he has a small open area on the dorsal midfoot which is new this week. He has not gotten his foot x-rays yet 3/15; his x-rays were both negative for osteomyelitis of both feet. No major change in any of his wounds on the extremities however his buttock wounds are better. We have been using polymen on the buttocks, left lower leg. Iodoflex on the left foot and silver alginate on the right 3/22; arrives in clinic today with the 2 major issues are the improvement in the left dorsal foot wound which for once actually looks healthy with a nice healthy wound surface without debridement. Using Iodoflex here. Unfortunately on the left lateral calf which is in the distal part of his original wound he came to the clinic here for there was purulent drainage noted some increased breakdown scattered  around the original area and a small area proximally. We we are using polymen here will change to silver alginate today. His buttock wound on the left is better and I think the area on the right first second toe webspace is also improved 3/29; left dorsal foot looks better. Using Iodoflex. Left ankle culture from deterioration last time grew E. coli, Enterobacter and Enterococcus. I will give him a course of cefdinir although that will not cover Enterococcus. The area on the right foot in the webspace of the first and second toe lateral first toe looks better. The area on his buttock is about healed Vascular appointment is on April 21. This is to look at his venous system vis--vis continued  breakdown of the wounds on the left including the left lateral leg and left dorsal foot he. He has had previous ablations on this side 4/5; the area between the right first and second toes lateral aspect of the first toe looks better. Dorsal aspect of the left first toe on the left foot also improved. Unfortunately the left lateral lower leg is larger and there is a second satellite wound superiorly. The usual superficial abrasions on the left buttock overall better but certainly not closed 4/12; the area between the right first and second toes is improved. Dorsal aspect of the left foot also slightly smaller with a vibrant healthy looking surface. No real change in the left lateral leg and the left buttock wound is healed He has an unaffordable co-pay for Apligraf. Appointment with vein and vascular with regards to the left leg venous part of the circulation is on 4/21 4/19; we continue to see improvement in all wound areas. Although this is minor. He has his vascular appointment on 4/21. The area on the left buttock has not reopened although right in the center of this area the skin looks somewhat threatened 4/26; the left buttock is unfortunately reopened. In general his left dorsal foot has a healthy surface  and looks somewhat smaller although it was not measured as such. The area between his first and second toe webspace on the right as a small wound against the first toe. Genther, Rondarius E ([id number]) [phone].pdf Page 29 of 38 The patient saw vascular surgery. The real question I was asking was about the small saphenous vein on the left. He has previously ablated left greater saphenous vein. Nothing further was commented on on the left. Right greater saphenous vein without reflux at the saphenofemoral junction or proximal thigh there was no indication for ablation of the right greater saphenous vein duplex was negative for DVT bilaterally. They did not think there was anything from a vascular surgery point of view that could be offered. They ABIs within normal limits 5/3; only small open area on the left buttock. The area on the left lateral leg which was his original venous reflux is now 2 wounds both which look clean. We are using Iodoflex on the left dorsal foot which looks healthy and smaller. He is down to a very tiny area between the right first and second toes, using silver alginate 5/10; all of his wounds appear better. We have much better edema control in 4 layer compression on the left. This may be the factor that is allowing the left foot and left lateral calf to heal. He has external compression garments at home 04/14/20-All of his wounds are progressing well, the left forefoot is practically closed, left ischium appears to be about the same, right toe webspace is also smaller. The left lateral leg is about the same, continue using Hydrofera Blue to this, silver alginate to the ischium, Iodoflex to the toe space on the right 6/7; most of his wounds outside of the left buttock are doing well. The area on the left lateral calf and left dorsal foot are smaller. The area on the right foot in between the first and second toe webspace is barely visible although he still says  there is some drainage here is the only reason I did not heal this out. ooUnfortunately the area on the left buttock almost looks like he has a skin tear from tape. He has open wound and then a large flap of skin that we are trying  to get adherence over an area just next to the remaining wound 6/21; 2 week follow-up. I believe is been here for nurse visits. Miraculously the area between his first and second toes on the left dorsal foot is closed over. Still open on the right first second web space. The left lateral calf has 2 open areas. Distally this is more superficial. The proximal area had a little more depth and required debridement of adherent necrotic material. His buttock wound is actually larger we have been using silver alginate here 6/28; the patient's area on the left foot remains closed. Still open wet area between the first and second toes on the right and also extending into the plantar aspect. We have been using silver alginate in this location. He has 2 areas on the left lower leg part of his original long wounds which I think are better. We have been using Hydrofera Blue here. Hydrofera Blue to the left buttock which is stable 7/12; left foot remains closed. Left ankle is closed. May be a small area between his right first and second toes the only truly open area is on the left buttock. We have been using Hydrofera Blue here 7/19; patient arrives with marked deterioration especially in the left foot and ankle. We did not put him in a compression wrap on the left last week in fact he wore his juxta lite stockings on either side although he does not have an underlying stocking. He has a reopening on the left dorsal foot, left lateral ankle and a new area on the right dorsal ankle. More worrisome is the degree of erythema on the left foot extending on the lateral foot into the lateral lower leg on the left 7/26; the patient had erythema and drainage from the lateral left ankle last  week. Culture of this grew MRSA resistant to doxycycline and clindamycin which are the 2 antibiotics we usually use with this patient who has multiple antibiotic allergies including linezolid, trimethoprim sulfamethoxazole. I had give him an empiric doxycycline and he comes in the area certainly looks somewhat better although it is blotchy in his lower leg. He has not been systemically unwell. He has had areas on the left dorsal foot which is a reopening, chronic wounds on the left lateral ankle. Both of these I think are secondary to chronic venous insufficiency. The area between his first and second toes is closed as far as I can tell. He had a new wrap injury on the right dorsal ankle last week. Finally he has an area on the left buttock. We have been using silver alginate to everything except the left buttock we are using Hydrofera Blue 06/30/20-Patient returns at 1 week, has been given a sample dose pack of NUZYRA which is a tetracycline derivative [omadacycline], patient has completed those, we have been using silver alginate to almost all the wounds except the left ischium where we are using Hydrofera Blue all of them look better 8/16; since I last saw the patient he has been doing well. The area on the left buttock, left lateral ankle and left foot are all closed today. He has completed the [place] I gave him last time and tolerated this well. He still has open areas on the right dorsal ankle and in the right first second toe area which we are using silver alginate. 8/23; we put him in his bilateral external compression stockings last week as he did not have anything open on either leg except for concerning area between  the right first and second toe. He comes in today with an area on the left dorsal foot slightly more proximal than the original wound, the left lateral foot but this is actually a continuation of the area he had on the left lateral ankle from last time. As well he is opened up on  the left buttock again. 8/30; comes in today with things looking a lot better. The area on the left lower ankle has closed down as has the left foot but with eschar in both areas. The area on the dorsal right ankle is also epithelialized. Very little remaining of the left buttock wound. We have been using silver alginate on all wound areas 9/13; the area in the first second toe webspace on the right has fully epithelialized. He still has some vulnerable epithelium on the right and the ankle and the dorsal foot. He notes weeping. He is using his juxta lite stocking. On the left again the left dorsal foot is closed left lateral ankle is closed. We went to the juxta lite stocking here as well. ooStill vulnerable in the left buttock although only 2 small open areas remain here 9/27; 2-week follow-up. We did not look at his left leg but the patient says everything is closed. He is a bit disturbed by the amount of edema in his left foot he is using juxta lite stockings but asking about over the toes stockings which would be 30/40, will talk to him next time. According to him there is no open wound on either the left foot or the left ankle/calf He has an open area on the dorsal right calf which I initially point a wrap injury. He has superficial remaining wound on the left ischial tuberosity been using silver alginate although he says this sticks to the wound 10/5; we gave him 2-week follow-up but he called yesterday expressing some concerns about his right foot right ankle and the left buttock. He came in early. There is still no open areas on the left leg and that still in his juxta lite stocking 10/11; he only has 1 small area on the left buttock that remains measuring millimeters 1 mm. Still has the same irritated skin in this area. We recommended zinc oxide when this eventually closes and pressure relief is meticulously is he can do this. He still has an area on the dorsal part of his right first  through third toes which is a bit irritated and still open and on the dorsal ankle near the crease of the ankle. We have been using silver alginate and using his own stocking. He has nothing open on the left leg or foot 10/25; 2-week follow-up. Not nearly as good on the left buttock as I was hoping. For open areas with 5 looking threatened small. He has the erythematous irritated chronic skin in this area. oo1 area on the right dorsal ankle. He reports this area bleeds easily ooRight dorsal foot just proximal to the base of his toes ooWe have been using silver alginate. 11/8; 2-week follow-up. Left buttock is about the same although I do not think the wounds are in the same location we have been using silver alginate. I have asked him to use zinc oxide on the skin around the wounds. ooHe still has a small area on the right dorsal ankle he reports this bleeds easily ooRight dorsal foot just proximal to the base of the toes does not have anything open although the skin is very dry and scaly   ooHe has a new opening on the nailbed of the left great toe. Nothing on the left ankle 11/29; 3-week follow-up. Left buttock has 2 open areas. And washing of these wounds today started bleeding easily. Suggesting very friable tissue. We have been using silver alginate. Right dorsal ankle which I thought was initially a wrap injury we have been using silver alginate. Nothing open between the toes that I can see. He states the area on the left dorsal toe nailbed healed after the last visit in 2 or 3 days 12/13; 3-week follow-up. His left buttock now has 3 open areas but the original 2 areas are smaller using polymen here. Surrounding skin looks better. The right dorsal ankle is closed. He has a small opening on the right dorsal foot at the level of the third toe. In general the skin looks better here. He is wearing his juxta lite stocking on the left leg says there is nothing open 11/24/2020; 3 weeks follow-up.  His left buttock still has the 3 open areas. We have been using polymen but due to lack of response he changed to [facility] area. Surrounding skin is dry erythematous and irritated looking. There is no evidence of infection either bacterial or fungal however there is loss of surface epithelium ooHe still has very dry skin in his foot causing irritation and erythema on the dorsal part of his toes. This is not responded to prolonged courses of antifungal simply looks dry and irritated 1/24; left buttock area still looks about the same he was unable to find the triad ointment that we had suggested. The area on the right lower leg just above the dorsal ankle has reopened and the areas on the right foot between the first second and second third toes and scaling on the bottom of the foot has been about the same for quite some time now. been using silver alginate to all wound areas 2/7; left buttock wound looked quite good although not much smaller in terms of surface area surrounding skin looks better. Only a few dry flaking areas on the right foot in between the first and second toes the skin generally looks better here [ammonium lactate]. Finally the area on the right dorsal ankle is closed 2/21; ooThere is no open area on the right foot even between the right first and second toe. Skin around this area dorsally and plantar aspects look better. ooHe has a reopening of the area on the right ankle just above the crease of the ankle dorsally. I continue to think that this is probably friction from spasms may be even this time with his stocking under the compression stockings. ooWounds on his left buttock look about the same there a couple of areas that have reopened. He has a total square area of loss of epithelialization. This does Steinkamp, Said E ([id number]) [phone].pdf Page 30 of 38 not look like infection it looks like a contact dermatitis but I just cannot determine  to what 3/14; there is nothing on the right foot between the first and second toes this was carefully inspected under illumination. Some chronic irritation on the dorsal part of his foot from toes 1-3 at the base. Nothing really open here substantially. Still has an area on the right foot/ankle that is actually larger and hyper granulated. His buttock area on the left is just about closed however he has chronic inflammation with loss of the surface epithelial layer 3/28; 2-week follow-up. In clinic today with a new wound on the  left anterior mid tibia. Says this happened about 2 weeks ago. He is not really sure how wonders about the spasticity of his legs at night whether that could have caused this other than that he does not have a good idea. He has been using topical antibiotics and silver alginate. The area on his right dorsal ankle seems somewhat better. ooFinally everything on his left buttock is closed. 4/11; 2-week follow-up. All of his wounds are better except for the area over the ischium and left buttock which have opened up widely again. At least part of this is covered in necrotic fibrinous material another part had rolled nonviable skin. The area on the right ankle, left anterior mid tibia are both a lot better. He had no open wounds on either foot including the areas between the first and second toes 4/25; patient presents for 2-week follow-up. He states that the wounds are overall stable. He has no complaints today and states he is using Hydrofera Blue to open wounds. 5/9; have not seen this man in over a month. For my memory he has open areas on the left mid tibia and right ankle. T oday he has new open area on the right dorsal foot which we have not had a problem with recently. He has the sustained area on the left buttock He is also changed his insurance at the beginning of the year [employer]. We will need prior authorizations for debridement 5/23; patient presents for 2-week  follow-up. He has prior authorizations for debridement. He denies any issues in the past 2 weeks with his wound care. He has been using Hydrofera Blue to all the wounds. He does report a circular rash to the upper left leg that is new. He denies acute signs of infection. 6/6; 2-week follow-up. The patient has open wounds on the left buttock which are worse than the last time I saw this about a month ago. He also has a new area to me on the left anterior mid tibia with some surrounding erythema. The area on the dorsal ankle on the right is closed but I think this will be a friction injury every time this area is exposed to either our wraps or his compression stockings caused by unrelenting spasms in this leg. 6/20; 2-week follow-up. oo The patient has open wounds on the left buttock which is about the same. Using [facility] here. - The left mid tibia has a static amount of surrounding erythema. Also a raised area in the center. We have been using Hydrofera Blue here. ooooFinally he has broken down in his dorsal right foot extending between the first and second toes and going to the base of the first and second toe webspace. I have previously assumed that this was severe venous hypertension 7/5; 2-week follow-up oo The left buttock wound actually looks better. We are using Hydrofera Blue. He has extensive skin irritation around this area and I have not really been able to get that any better. I have tried Lotrisone i.e. antifungals and steroids. More most recently we have just been using Coloplast really looks about the same. oo The left mid tibia which was new last week culture to have very resistant staph aureus. Not only methicillin-resistant but doxycycline resistant. The patient has a plethora of antibiotic allergies including sulfa, linezolid. I used topical bacitracin on this but he has not started this yet. oo In addition he has an expanding area of erythema with a wound on the dorsal  right foot.  I did a deep tissue culture of this area today 7/12; oo Left buttock area actually looks better surrounding skin also looks less irritated. oo Left mid tibia looks about the same. He is using bacitracin this is not worse oo Right dorsal foot looks about the same as well. oo The left first toe also looks about the same 7/19; left buttock wound continues to improve in terms of open areas oo Left mid tibia is still concerning amount of swelling he is using bacitracin oo Dorsal left first toe somewhat smaller oo Right dorsal foot somewhat smaller 7/25; left buttock wound actually continues to improve oo Left mid tibia area has less swelling. I gave him all my samples of new Nuzyra. This seems to have helped although the wound is still open it. His abrasion closed by here oo Left dorsal great toe really no better. Still a very nonviable surface oo Right dorsal foot perhaps some better. oo We have been using bacitracin and silver nitrate to the areas on his lower legs and Hydrofera Blue to the area on the buttock. 8/16 oo Disappointed that his left buttock wound is actually more substantial. Apparently during the last nurse visit these were both very small. He has continued irritation to a large area of skin on his buttock. I have never been able to totally explain this although I think it some combination of the way he sits, pressure, moisture. He is not incontinent enough to contribute to this. oo Left dorsal great toe still fibrinous debris on the surface that I have debrided today oo Large area across the dorsal right toes. oo The area on the left anterior mid tibia has less swelling. He completed the [place]. This does not look infected although the tissue is still fried 8/30; 2-week follow-up. oo Left buttock areas not improved. We used Hydrofera Blue on this. Weeping wet with the surrounding erythema that I have not been able to control even with Lotrisone and  topical Coloplast oo Left dorsal great toe looks about the same oo More substantial area again at the base of his toes on the left which is new this week. oo Area across the dorsal right toes looks improved oo The left anterior mid tibia looks like it is trying to close 9/13; 2-week follow-up. Using silver alginate on all of his wounds. The left dorsal foot does not look any better. He has the area on the dorsal toe and also the areas at the base of all of his toes 1 through 3. On the right foot he has a similar pattern in a similar area. He has the area on his left mid tibia that looks fairly healthy. Finally the area on his left buttock looks somewhat bette 9/20; culture I did of the left foot which was a deep tissue culture last time showed E. coli he has erythema around this wound. Still a completely necrotic surface. His right dorsal foot looks about the same. He has a very friable surface to the left anterior mid tibia. Both buttock wounds look better. We have been using silver alginate to all wounds 10/4; he has completed the cephalexin that I gave him last time for the left foot. He is using topical gentamicin under silver alginate silver alginate being applied to all the wounds. Unfortunately all the wounds look irritated on his dorsal right foot dorsal left foot left mid tibia. I wonder if this could be a silver allergy. I am going to change him to [facility]  on the lower extremity. The skin on the left buttock and left posterior thigh still flaking dry and irritated. This has continued no matter what I have applied topically to this. He has a solitary open wound which by itself does not look too bad however the entire area of surrounding skin does not change no matter what we have applied here 10/18; the area on the left dorsal foot and right dorsal foot both look better. The area on the right extends into the plantar but not between his toes. We have been using silver alginate.  He still has a rectangular erythematous area around the area on the left tibia. The wound itself is very small. Finally everything on his left buttock looks a little larger the skin is erythematous 11/15; patient comes in with the left dorsal and right dorsal foot distally looking somewhat better. Still nonviable surface on the left foot which required debridement. He still has the area on the left anterior mid tibia although this looks somewhat better. He has a new area on the right lateral lower leg just above the ankle. Finally his left buttock looks terrible with multiple superficial open wounds geometric square shaped area of chronic erythema which I have not been able to sort through 11/29; right dorsal foot and left dorsal foot both look somewhat better. No debridement required. He has the fragile area on the left anterior mid tibia this looks and continues to look somewhat better. Right lateral lower leg just above the ankle we identified last time also looks better. In general the area on his left buttock looks improved. We are using Hydrofera Blue to all wound areas 12/13; right dorsal foot looks better. The area on the right lateral leg is healed. Left dorsal foot has 2 open areas both of which require debridement. The fragile area on the left anterior mid tibial looks better. Smaller area on his buttocks. Were using Hydrofera Blue Kloss, Brit E ([id number]) [id number].pdf Page 31 of 38 1/10; patient comes in with everything looking slightly larger and/or worse. This includes his left buttock, reopening of the left mid tibia, larger areas on the left dorsal foot and what looks to be a cellulitis on the right dorsal and plantar foot. We have been using Hydrofera Blue on all wounds. 1/17; right dorsal foot distally looks better today. The left foot has 2 open wounds that are about the same surrounding erythema. Culture I did last week showed rare Enterococcus and a  multidrug resistant MRSA. 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 No malignancy they did not stain for fungus 1/24; his right distal foot is not closed dry and scaly but the wound looks like it is contracting. I did not debride anything here. Problem on his left dorsal foot with expanding erythema. Apparently there were problems last week getting the Elesa Hacker however it is now available at the [employer] [facility] but a week later. He is using ketoconazole and Coloplast to the left buttock along with Hydrofera Blue this actually looks quite a bit better today. 1/31; right dorsal foot again is dry and scaly but looking to contract. He has been using a moisturizer on his feet at my request but he is not sure which 1. The left dorsal foot wounds look about the same there is erythema here that I marked last week however after course of Nuzyra it certainly is not any better but not any worse either. Finally on his left buttock the skin  continues to look better he has the original wound but a new substantial area towards the gluteal cleft. Almost like a skin tear. I used scissors to remove skin and subcutaneous tissue here silver nitrate and direct pressure 2/7; right dorsal foot. This does not look too much different from last week. Some erythema skin dry and scaly. No debridement. Left dorsal great toe again still not much improvement. I did remove flaking dry skin and callus from around the edge. Finally on his left buttock. The skin is somewhat better in the periwound. Surface wounds are superficial somewhat better than last week. 01/26/2022: Is a little bit of a mystery as to why his wounds fail to respond to treatments and actually seem to get worse. This is my first encounter with this patient. He was previously followed by Dr. Dellia Nims. Based upon my review of the chart, it seems that there is a little bit of a mystery as to why his wounds do not  respond as anticipated to the interventions applied and sometimes even get worse. Biopsies have been performed and he was seen by dermatology in [place], but that did not shed any light on the matter. T oday, his gluteal wound is larger, with substantial drainage, rather malodorous. The food wounds are not terrible, but he has a lot of callus and scaly skin around these. He is currently getting silver alginate on the gluteal wound, with idodoflex to the feet. He is using lotrisone on his legs for the dry, scaly skin. 02/09/2022: There has really been no change to any of his wounds. The gluteal wound less drainage and odor, but remains about the same size, the periwound skin remains oddly scaly. His lower extremity wounds also appear roughly the same size. They continue to accumulate a small amount of slough. The periwound on his feet and ankle wounds has dry eschar and loose dead skin. We have been using silver alginate on the gluteal wound and Iodoflex on his feet and ankle wounds. T the periwound around his gluteal lesion and Lotrisone on his feet and legs. o 02/23/2022: The right plantar foot wound is closed. The gluteal site looks small but has continued to produce hypertrophic granulation tissue. The foot wounds all look about the same on the dorsal surface of the right foot; on the left, there is only a small open area at the site of where his left great toenail would have been. 03/16/2022: The right ankle wound is healed. The right dorsal foot wound is about the same. The left dorsal foot wound is quite a bit smaller and the ischial wound is nearly closed. 03/30/2022: The right ankle wound reopened. Both dorsal foot wounds are quite a bit smaller. Unfortunately, he appears to have sheared part of his ischial wound open further, perhaps during a transfer. 04/13/2022: The right ankle wound has hypertrophic granulation tissue present. The dorsal foot wounds continue to decrease in size. The ischial  wound looks about the same today, no better, no worse. 04/27/2022: The right ankle wound has closed. Unfortunately, it looks like some moisture got underneath the dry skin on both of his dorsal feet and these wounds have expanded in size. The ischial wound remains the same with perhaps a little bit more slough accumulation than at our previous visit. 05/11/2022: The right ankle wound remains closed. There is a left anterior tibial wound that is small has patchy openings with accumulated slough. The dorsum of his right foot appears to be nearly healed with just a  small punctate opening. The plantar surface of his right foot has a new opening that looks like he may have picked some skin there. His sacral ulcer has hypertrophic granulation tissue but has some slough accumulation. The dorsum of his left foot has multiple open areas in a fairly ragged distribution. All of these have slough accumulated within them. 06/01/2022: The right ankle and left anterior tibial wound are both closed. Dorsum of his right foot and left foot both look substantially better with just tiny scattered openings The without any slough accumulation. He has sheared open new areas on his left gluteus and ischium. He says that his wheelchair cushion, which is air-filled, has a leak and so it keeps deflating. He is awaiting a new cushion. 06/15/2022: The right ankle wound has reopened and the fat layer is exposed. Both dorsal feet have just small openings with just a little bit of slough and eschar accumulation. The wound on his left gluteus and ischium is larger again today and has a foul odor. 06/29/2022: The right ankle wound has hypertrophic granulation tissue buildup. His dorsal foot wounds both look better with just some eschar on the surface. He has a new wound on his left lateral ankle. He is not sure how he acquired it but by appearance, it looks that he hit it on something, potentially his wheelchair or bed. The ischial wound is  about the same but is cleaner without any significant purulent drainage or odor. He did not understand what the [facility] call was about and therefore he does not have the topical compounded antibiotic. 07/13/2022: The right ankle wound again has hypertrophic granulation tissue, but less so than at his previous visit. The ischial wound has improved tremendously the use of the [facility] topical antibiotic. No significant change to the left lateral ankle wound; it is fibrotic with slough present. The skin of both of his feet, especially on the right, has a yeasty appearance. 08/10/2022: There is again hypertrophic granulation tissue on the right anterior ankle wound. Both feet are about the same. The left lateral ankle wound is a little bit desiccated and has some slough buildup. He unfortunately suffered a new injury when he was removing his pants and they caught his bandage which caused a large skin tear on his left ischium, just distal to the existing wound. The existing left ischial wound, however, is significantly better with just a little light slough on the surface. 08/31/2022: The right anterior ankle wound is a lot smaller today underneath some eschar. No accumulation of hypertrophic granulation tissue. The left lateral ankle wound has some slough on the surface but is better in terms of moisture balance this week. The left dorsal foot does not really have any openings on it today. The right dorsal foot has some slough and eschar accumulation. His gluteal ulcer is basically closed aside from 2 small areas that are oozing a bit. 09/21/2022: The right anterior ankle wound is down to just a tiny pinhole. The left lateral ankle wound has accumulated slough again, but moisture balance is good. Left and right dorsal feet both have 2 small openings with some slough in them. The gluteal ulcer, unfortunately has opened up substantially. 10/05/2022: The right anterior ankle wound has reopened and has a fair  amount of slough on the surface. The left lateral ankle wound has accumulated more slough, as well. He was approved for Apligraf, but the wound is not clean enough yet. There is some slough buildup on the dorsal right foot  wound, as well as on his ischium. 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 10/26/2022: The right anterior ankle wound has closed again. The left lateral ankle wound looks quite a bit better. It is ready for Apligraf but we do not have 1 here for him. His ischium continues to get worse, with the wound larger and deeper. It really seems like this is a combination of pressure and friction. He has 2 new wounds, 1 on the plantar aspect of each foot. He says that he had some dry skin there that peeled away. Both are superficial. The dorsum of his left foot Okane, Barrie E ([id number]) [phone].pdf Page 32 of 38 does not have any new wound opening. The dorsum of his right foot has a bit of slough accumulation. 11/24/2022: The right anterior ankle wound has 2 small open areas, both covered with eschar. The left lateral ankle wound has closed and considerably with just a little slough on the surface. The ischium has improved most significantly, having closed in most of the open area with just a few small remaining superficial openings. The dorsal foot wounds are small and superficial. The bottoms of his feet are closed. 12/07/2022: The right anterior ankle wound remains open with 2 small areas, again with slough and eschar present. The left lateral ankle wound is down to about half a centimeter with just some slough on the surface. His right dorsal foot has opened up more and has both slough and eschar present. The ischium has continued to improve and now just has 2 small open areas that are very clean. 12/21/2022: He has a new wound on his right lateral ankle. There is some slough present. The left lateral ankle  is quite a bit smaller with some slough buildup. His left ischial ulcer is also nearly closed; there is just a tear in his skin that seems likely to be secondary to a transfer mishap. He has a new ulcer in his natal cleft that looks like it may be secondary to moisture. There is some slough in this area as well. The right anterior ankle is nearly closed. The right dorsal foot wound has some slough accumulation 01/04/2023: The anterior right ankle wound is closed, as is his ischial ulcer. The left lateral ankle wound is nearly closed with just a little bit of slough accumulation. The ulcer in his natal cleft is about the same size with slough buildup there, as well. He has a new wound on his left lateral leg near the knee from rubbing on his wheelchair. There is slough present, but no signs of infection. The right dorsal foot wound has a fairly heavy layer of slough buildup, as well. Patient History Information obtained from Patient. Family History Diabetes - Father, Hypertension - Mother, No family history of Cancer, Heart Disease, Hereditary Spherocytosis, Kidney Disease, Lung Disease, Stroke, Thyroid Problems, Tuberculosis. Social History Former smoker, Marital Status - Married, Alcohol Use - Moderate, Drug Use - No History, Caffeine Use - Daily. Medical History Ear/Nose/Mouth/Throat Denies history of Chronic sinus problems/congestion, Middle ear problems Hematologic/Lymphatic Denies history of Anemia, Hemophilia, Human Immunodeficiency Virus, Lymphedema, Sickle Cell Disease Respiratory Patient has history of Sleep Apnea - does not tolerate cpap Denies history of Aspiration, Asthma, Chronic Obstructive Pulmonary Disease (COPD), Pneumothorax, Tuberculosis Cardiovascular Patient has history of Hypertension - lisinopril HCTZ Denies history of Angina, Arrhythmia, Congestive Heart Failure, Coronary Artery Disease, Deep Vein Thrombosis, Hypotension, Myocardial Infarction, Peripheral  Arterial  Disease, Peripheral Venous Disease, Phlebitis, Vasculitis Gastrointestinal Denies history of Cirrhosis , Colitis, Crohnoos, Hepatitis A, Hepatitis B, Hepatitis C Endocrine Denies history of Type I Diabetes, Type II Diabetes Genitourinary Denies history of End Stage Renal Disease Immunological Denies history of Lupus Erythematosus, Raynaudoos, Scleroderma Integumentary (Skin) Denies history of History of Burn Musculoskeletal Denies history of Gout, Rheumatoid Arthritis, Osteoarthritis, Osteomyelitis Neurologic Patient has history of Paraplegia Denies history of Dementia, Neuropathy, Quadriplegia, Seizure Disorder Oncologic Denies history of Received Chemotherapy, Received Radiation Psychiatric Denies history of Anorexia/bulimia, Confinement Anxiety Hospitalization/Surgery History - cellulitis in leg. - left leg vein ablation. Objective Constitutional Slightly hypertensive. Tachycardic, asymptomatic. no acute distress. Vitals Time Taken: 8:17 AM, Height: 70 in, Weight: 216 lbs, BMI: 31, Temperature: 98.4 F, Pulse: 116 bpm, Respiratory Rate: 18 breaths/min, Blood Pressure: 144/84 mmHg. Respiratory Normal work of breathing on room air. General Notes: 01/04/2023: The anterior right ankle wound is closed, as is his ischial ulcer. The left lateral ankle wound is nearly closed with just a little bit of slough accumulation. The ulcer in his natal cleft is about the same size with slough buildup there, as well. He has a new wound on his left lateral leg near the knee from rubbing on his wheelchair. There is slough present, but no signs of infection. The right dorsal foot wound has a fairly heavy layer of slough buildup, as well. Burr, Nasean E ([id number]) [phone].pdf Page 33 of 38 Integumentary (Hair, Skin) Wound #52 status is Open. Original cause of wound was Gradually Appeared. The date acquired was: 03/27/2021. The wound has been in treatment 92 weeks. The  wound is located on the Right,Dorsal Foot. The wound measures 1.1cm length x 4.2cm width x 0.1cm depth; 3.629cm^2 area and 0.363cm^3 volume. There is Fat Layer (Subcutaneous Tissue) exposed. There is no tunneling or undermining noted. There is a medium amount of purulent drainage noted. The wound margin is distinct with the outline attached to the wound base. There is small (1-33%) red granulation within the wound bed. There is a large (67-100%) amount of necrotic tissue within the wound bed including Adherent Slough. The periwound skin appearance had no abnormalities noted for texture. The periwound skin appearance had no abnormalities noted for color. The periwound skin appearance exhibited: Dry/Scaly. Periwound temperature was noted as No Abnormality. Wound #56 status is Open. Original cause of wound was Gradually Appeared. The date acquired was: 07/11/2021. The wound has been in treatment 76 weeks. The wound is located on the Left,Dorsal Foot. The wound measures 0.1cm length x 0.1cm width x 0.1cm depth; 0.008cm^2 area and 0.001cm^3 volume. There is Fat Layer (Subcutaneous Tissue) exposed. There is no tunneling or undermining noted. There is a small amount of serous drainage noted. The wound margin is flat and intact. There is small (1-33%) red granulation within the wound bed. There is no necrotic tissue within the wound bed. The periwound skin appearance had no abnormalities noted for texture. The periwound skin appearance exhibited: Dry/Scaly, Erythema. The periwound skin appearance did not exhibit: Maceration. The surrounding wound skin color is noted with erythema. Erythema is measured at 6 cm. Periwound temperature was noted as No Abnormality. Wound #65 status is Open. Original cause of wound was Pressure Injury. The date acquired was: 05/24/2022. The wound has been in treatment 31 weeks. The wound is located on the Left Upper Leg. The wound measures 0cm length x 0cm width x 0cm depth; 0cm^2 area  and 0cm^3 volume. There is no tunneling or undermining noted. There  is a none present amount of drainage noted. There is no granulation within the wound bed. There is no necrotic tissue within the wound bed. The periwound skin appearance had no abnormalities noted for color. The periwound skin appearance exhibited: Scarring, Dry/Scaly. The periwound skin appearance did not exhibit: Excoriation, Maceration. Periwound temperature was noted as No Abnormality. Wound #66 status is Healed - Epithelialized. Original cause of wound was Pressure Injury. The date acquired was: 06/15/2022. The wound has been in treatment 29 weeks. The wound is located on the Right,Anterior Ankle. The wound measures 0cm length x 0cm width x 0cm depth; 0cm^2 area and 0cm^3 volume. There is Fat Layer (Subcutaneous Tissue) exposed. There is no tunneling or undermining noted. There is a none present amount of drainage noted. The wound margin is flat and intact. There is no granulation within the wound bed. There is no necrotic tissue within the wound bed. The periwound skin appearance had no abnormalities noted for color. The periwound skin appearance exhibited: Rash, Dry/Scaly. Periwound temperature was noted as No Abnormality. Wound #67 status is Open. Original cause of wound was Gradually Appeared. The date acquired was: 06/22/2022. The wound has been in treatment 27 weeks. The wound is located on the Left,Lateral Lower Leg. The wound measures 1cm length x 0.6cm width x 0.1cm depth; 0.471cm^2 area and 0.047cm^3 volume. There is Fat Layer (Subcutaneous Tissue) exposed. There is no tunneling or undermining noted. There is a medium amount of serosanguineous drainage noted. The wound margin is distinct with the outline attached to the wound base. There is large (67-100%) red granulation within the wound bed. There is a small (1-33%) amount of necrotic tissue within the wound bed including Adherent Slough. The periwound skin appearance had  no abnormalities noted for texture. The periwound skin appearance exhibited: Dry/Scaly, Erythema. The surrounding wound skin color is noted with erythema with red streaks. Periwound temperature was noted as No Abnormality. Wound #71 status is Open. Original cause of wound was Gradually Appeared. The date acquired was: 12/06/2022. The wound has been in treatment 2 weeks. The wound is located on the Right,Lateral Lower Leg. The wound measures 0.5cm length x 0.7cm width x 0.1cm depth; 0.275cm^2 area and 0.027cm^3 volume. There is Fat Layer (Subcutaneous Tissue) exposed. There is no tunneling or undermining noted. There is a medium amount of serosanguineous drainage noted. The wound margin is flat and intact. There is no granulation within the wound bed. There is a large (67-100%) amount of necrotic tissue within the wound bed including Eschar and Adherent Slough. The periwound skin appearance had no abnormalities noted for texture. The periwound skin appearance had no abnormalities noted for moisture. The periwound skin appearance had no abnormalities noted for color. Periwound temperature was noted as No Abnormality. Wound #72 status is Open. Original cause of wound was Pressure Injury. The date acquired was: 12/13/2022. The wound has been in treatment 2 weeks. The wound is located on the Right Gluteus. The wound measures 3.1cm length x 0.7cm width x 0.1cm depth; 1.704cm^2 area and 0.17cm^3 volume. There is Fat Layer (Subcutaneous Tissue) exposed. There is no tunneling or undermining noted. There is a medium amount of serosanguineous drainage noted. The wound margin is flat and intact. There is medium (34-66%) red granulation within the wound bed. There is a medium (34-66%) amount of necrotic tissue within the wound bed including Adherent Slough. The periwound skin appearance had no abnormalities noted for texture. The periwound skin appearance had no abnormalities noted for moisture. The periwound skin  appearance had no abnormalities noted for color. Periwound temperature was noted as No Abnormality. Wound #73 status is Open. Original cause of wound was Pressure Injury. The date acquired was: 12/21/2022. The wound is located on the Left,Proximal,Lateral Lower Leg. The wound measures 2.7cm length x 2cm width x 0.1cm depth; 4.241cm^2 area and 0.424cm^3 volume. There is Fat Layer (Subcutaneous Tissue) exposed. There is no tunneling or undermining noted. There is a medium amount of serosanguineous drainage noted. The wound margin is distinct with the outline attached to the wound base. There is medium (34-66%) pink granulation within the wound bed. There is a medium (34-66%) amount of necrotic tissue within the wound bed including Adherent Slough. The periwound skin appearance had no abnormalities noted for texture. The periwound skin appearance had no abnormalities noted for moisture. The periwound skin appearance had no abnormalities noted for color. Periwound temperature was noted as No Abnormality. 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 There was a Excisional Skin/Subcutaneous Tissue Debridement with a total area of 4.62 sq cm performed by Fredirick Maudlin, MD. With the following instrument(s): Curette to remove  Viable and Non-Viable tissue/material. Material removed includes Subcutaneous Tissue and Slough and. No specimens were taken. A time out was conducted at 09:00, prior to the start of the procedure. A Minimum amount of bleeding was controlled with Pressure. The procedure was tolerated well with a pain level of Insensate throughout and a pain level of Insensate following the procedure. Post Debridement Measurements: 1.1cm length x 4.2cm width x 0.1cm depth; 0.363cm^3 volume. Character of Wound/Ulcer Post Debridement is improved. Severity of Tissue Post Debridement is: Fat layer exposed. Post procedure Diagnosis Wound #52: Same as Pre-Procedure Bulluck, Zavon E ([id number] Page 34 of 38 General Notes: scribed by Baruch Gouty, RN for Dr. Celine Ahr. Wound #71 Pre-procedure diagnosis of Wound #71 is a Venous Leg Ulcer located on the Right,Lateral Lower Leg .Severity of Tissue Pre Debridement is: Fat layer exposed. There was a Selective/Open Wound Non-Viable Tissue Debridement with a total area of 0.35 sq cm performed by Fredirick Maudlin, MD. With the following instrument(s): Curette to remove Non-Viable tissue/material. Material removed includes Eschar. No specimens were taken. A time out was conducted at 09:00, prior to the start of the procedure. A Minimum amount of bleeding was controlled with Pressure. The procedure was tolerated well with a pain level of Insensate throughout and a pain level of Insensate following the procedure. Post Debridement Measurements: 0.5cm length x 0.7cm width x 0.1cm depth; 0.027cm^3 volume. Character of Wound/Ulcer Post Debridement is improved. Severity of Tissue Post Debridement is: Fat layer exposed. Post procedure Diagnosis Wound #71: Same as Pre-Procedure General Notes: scribed by Baruch Gouty, RN for Dr. Celine Ahr. Wound #72 Pre-procedure diagnosis of Wound #72 is a Pressure Ulcer located on the Right Gluteus . There was a  Selective/Open Wound Non-Viable Tissue Debridement with a total area of 2.17 sq cm  performed by Fredirick Maudlin, MD. With the following instrument(s): Curette to remove Non-Viable tissue/material. Material removed includes [facility]. No specimens were taken. A time out was conducted at 09:00, prior to the start of the procedure. A Minimum amount of bleeding was controlled with Pressure. The procedure was tolerated well with a pain level of Insensate throughout and a pain level of Insensate following the procedure. Post Debridement Measurements: 3.1cm length x 0.7cm width x 0.1cm depth; 0.17cm^3 volume. Post debridement Stage noted as Category/Stage III. Character of Wound/Ulcer Post Debridement is improved. Post procedure Diagnosis Wound #72: Same as Pre-Procedure General Notes: scribed by Baruch Gouty, RN for Dr. Celine Ahr. Wound #73 Pre-procedure diagnosis of Wound #73 is a Pressure Ulcer located on the Left,Proximal,Lateral Lower Leg . There was a Selective/Open Wound Non-Viable Tissue Debridement with a total area of 5.4 sq cm performed by Fredirick Maudlin, MD. With the following instrument(s): Curette to remove Non-Viable tissue/material. Material removed includes [facility]. No specimens were taken. A time out was conducted at 09:00, prior to the start of the procedure. A Minimum amount of bleeding was controlled with Pressure. The procedure was tolerated well with a pain level of Insensate throughout and a pain level of Insensate following the procedure. Post Debridement Measurements: 2.7cm length x 2cm width x 0.1cm depth; 0.424cm^3 volume. Post debridement Stage noted as Category/Stage III. Character of Wound/Ulcer Post Debridement is improved. Post procedure Diagnosis Wound #73: Same as Pre-Procedure General Notes: scribed by Baruch Gouty, RN for Dr. Celine Ahr. Plan Follow-up Appointments: Return Appointment in 2 weeks. - Dr. Celine Ahr RM 1 Tuesday 2/27 @ 0815 am Bathing/ Shower/ Hygiene: May  shower and wash wound with soap and water. - on days that dressing is changed Edema Control - Lymphedema / SCD / Other: Moisturize legs daily. - using Aquaphor generously to both legs and feet with dressing changes Compression stocking or Garment 30-40 mm/Hg pressure to: - Juxtalite to both legs daily Off-Loading: Roho cushion for wheelchair Turn and reposition every 2 hours - lift up with arms in wheelchair every hour during the day WOUND #52: - Foot Wound Laterality: Dorsal, Right Cleanser: Soap and Water Every Other Day/30 Days Discharge Instructions: May shower and wash wound with dial antibacterial soap and water prior to dressing change. Cleanser: Wound Cleanser Every Other Day/30 Days Discharge Instructions: Cleanse the wound with wound cleanser prior to applying a clean dressing using gauze sponges, not tissue or cotton balls. Peri-Wound Care: Sween Lotion (Moisturizing lotion) Every Other Day/30 Days Discharge Instructions: Apply Aquaphor moisturizing lotion as directed Prim Dressing: [place] 2x2 (in/in) Every Other Day/30 Days ary Discharge Instructions: Apply to wound bed as instructed Secondary Dressing: Woven Gauze Sponge, Non-Sterile 4x4 in Every Other Day/30 Days Discharge Instructions: Apply over primary dressing as directed. Secured With: [employer], 4.5x3.1 (in/yd) (Generic) Every Other Day/30 Days Discharge Instructions: Secure with Kerlix as directed. Secured With: 71M Medipore H Soft Cloth Surgical T ape, 4 x 10 (in/yd) (Generic) Every Other Day/30 Days Discharge Instructions: Secure with tape as directed. Com pression Stockings: Circaid Juxta Lite Compression Wrap Compression Amount: 30-40 mmHg (right) Discharge Instructions: Apply Circaid Juxta Lite Compression Wrap daily as instructed. Apply first thing in the morning, remove at night before bed. WOUND #56: - Foot Wound Laterality: Dorsal, Left Cleanser: Soap and Water Every Other Day/30  Days Discharge Instructions: May shower and wash wound with dial antibacterial soap and water prior to dressing change. Cleanser: Wound Cleanser Every Other Day/30 Days Discharge Instructions: Cleanse the wound  with wound cleanser prior to applying a clean dressing using gauze sponges, not tissue or cotton balls. Peri-Wound Care: Sween Lotion (Moisturizing lotion) Every Other Day/30 Days Discharge Instructions: Apply Aquaphor moisturizing lotion as directed Prim Dressing: [place] 2x2 (in/in) Every Other Day/30 Days ary Discharge Instructions: Apply to wound bed as instructed Secondary Dressing: Woven Gauze Sponge, Non-Sterile 4x4 in Every Other Day/30 Days Discharge Instructions: Apply over primary dressing as directed. Secured With: [employer], 4.5x3.1 (in/yd) (Generic) Every Other Day/30 Days Discharge Instructions: Secure with Kerlix as directed. Secured With: 960M Medipore H Soft Cloth Surgical T ape, 4 x 10 (in/yd) (Generic) Every Other Day/30 Days Discharge Instructions: Secure with tape as directed. WOUND #67: - Lower Leg Wound Laterality: Left, Lateral Cleanser: Soap and Water Every Other Day/30 Days Discharge Instructions: May shower and wash wound with dial antibacterial soap and water prior to dressing change. Cleanser: Wound Cleanser Every Other Day/30 Days Discharge Instructions: Cleanse the wound with wound cleanser prior to applying a clean dressing using gauze sponges, not tissue or cotton balls. Peri-Wound Care: Triamcinolone 15 (g) Every Other Day/30 Days Discharge Instructions: Use triamcinolone 15 (g) as directed Cullinane, Jamian E ([id number].pdf Page 35 of 38 Peri-Wound Care: Sween Lotion (Moisturizing lotion) Every Other Day/30 Days Discharge Instructions: Apply Aquaphor moisturizing lotion as directed Prim Dressing: [place] 2x2 (in/in) Every Other Day/30 Days ary Discharge Instructions: Apply to wound  bed as instructed Secondary Dressing: Woven Gauze Sponge, Non-Sterile 4x4 in Every Other Day/30 Days Discharge Instructions: Apply over primary dressing as directed. Secured With: [employer], 4.5x3.1 (in/yd) (Generic) Every Other Day/30 Days Discharge Instructions: Secure with Kerlix as directed. Secured With: 960M Medipore H Soft Cloth Surgical T ape, 4 x 10 (in/yd) (Generic) Every Other Day/30 Days Discharge Instructions: Secure with tape as directed. WOUND #71: - Lower Leg Wound Laterality: Right, Lateral Cleanser: Soap and Water Every Other Day/30 Days Discharge Instructions: May shower and wash wound with dial antibacterial soap and water prior to dressing change. Cleanser: Wound Cleanser Every Other Day/30 Days Discharge Instructions: Cleanse the wound with wound cleanser prior to applying a clean dressing using gauze sponges, not tissue or cotton balls. Peri-Wound Care: Sween Lotion (Moisturizing lotion) Every Other Day/30 Days Discharge Instructions: Apply Aquaphor moisturizing lotion as directed Prim Dressing: [place] 2x2 (in/in) Every Other Day/30 Days ary Discharge Instructions: Apply to wound bed as instructed Secondary Dressing: Woven Gauze Sponge, Non-Sterile 4x4 in Every Other Day/30 Days Discharge Instructions: Apply over primary dressing as directed. Secured With: [employer], 4.5x3.1 (in/yd) (Generic) Every Other Day/30 Days Discharge Instructions: Secure with Kerlix as directed. Secured With: 960M Medipore H Soft Cloth Surgical T ape, 4 x 10 (in/yd) (Generic) Every Other Day/30 Days Discharge Instructions: Secure with tape as directed. WOUND #72: - Gluteus Wound Laterality: Right Prim Dressing: [place] [employer] Dressing 2x2 (in/in) Every Other Day/30 Days ary Discharge Instructions: Apply to wound bed as instructed Secondary Dressing: Zetuvit Plus Silicone Border Dressing 4x4 (in/in) Every Other Day/30 Days Discharge Instructions: Apply silicone  border over primary dressing as directed. WOUND #73: - Lower Leg Wound Laterality: Left, Lateral, Proximal Prim Dressing: [place] AG Dressing 2x2 (in/in) Every Other Day/30 Days ary Discharge Instructions: Apply to wound bed as instructed Secondary Dressing: Zetuvit Plus Silicone Border Dressing 4x4 (in/in) Every Other Day/30 Days Discharge Instructions: Apply silicone border over primary dressing as directed. 01/04/2023: The anterior right ankle wound is closed, as is his ischial ulcer. The left lateral ankle  wound is nearly closed with just a little bit of slough accumulation. The ulcer in his natal cleft is about the same size with slough buildup there, as well. He has a new wound on his left lateral leg near the knee from rubbing on his wheelchair. There is slough present, but no signs of infection. The right dorsal foot wound has a fairly heavy layer of slough buildup, as well. I used a curette to debride slough and nonviable subcutaneous tissue from the right dorsal foot wound. I debrided eschar from the right lateral ankle wound, slough from the natal cleft wound, the left lateral ankle wound, and the new lateral left lower leg wound. We will use silver alginate to all of these sites. We also suggested that he consider a strap or belt to keep his leg from flopping into the side of his wheelchair. Follow-up in 2 weeks. Electronic Signature(s) Signed: 01/04/2023 9:19:01 AM By: Fredirick Maudlin MD FACS Entered By: Fredirick Maudlin on 01/04/2023 09:19:01 -------------------------------------------------------------------------------- HxROS Details Patient Name: Date of Service: Snellings, [name] 01/04/2023 8:15 A M Medical Record Number: [id number] Patient Account Number: [id number] Date of Birth/Sex: Treating RN: 1986/03/14 (37 y.o. M) Primary Care Provider: [place], [place] Other Clinician: Referring Provider: Treating Provider/Extender: Cornelia Copa [name]:  365 Information Obtained From Patient Ear/Nose/Mouth/Throat Medical History: Negative for: Chronic sinus problems/congestion; Middle ear problems Hematologic/Lymphatic Medical History: Negative for: Anemia; Hemophilia; Human Immunodeficiency Virus; Lymphedema; Sickle Cell Disease Bhakta, Keithon E ([id number].pdf Page 36 of 38 Respiratory Medical History: Positive for: Sleep Apnea - does not tolerate cpap Negative for: Aspiration; Asthma; Chronic Obstructive Pulmonary Disease (COPD); Pneumothorax; Tuberculosis Cardiovascular Medical History: Positive for: Hypertension - lisinopril HCTZ Negative for: Angina; Arrhythmia; Congestive Heart Failure; Coronary Artery Disease; Deep Vein Thrombosis; Hypotension; Myocardial Infarction; Peripheral Arterial Disease; Peripheral Venous Disease; Phlebitis; Vasculitis Gastrointestinal Medical History: Negative for: Cirrhosis ; Colitis; Crohns; Hepatitis A; Hepatitis B; Hepatitis C Endocrine Medical History: Negative for: Type I Diabetes; Type II Diabetes Genitourinary Medical History: Negative for: End Stage Renal Disease Immunological Medical History: Negative for: Lupus Erythematosus; Raynauds; Scleroderma Integumentary (Skin) Medical History: Negative for: History of Burn Musculoskeletal Medical History: Negative for: Gout; Rheumatoid Arthritis; Osteoarthritis; Osteomyelitis Neurologic Medical History: Positive for: Paraplegia Negative for: Dementia; Neuropathy; Quadriplegia; Seizure Disorder Oncologic Medical History: Negative for: Received Chemotherapy; Received Radiation Psychiatric Medical History: Negative for: Anorexia/bulimia; Confinement Anxiety Immunizations Pneumococcal Vaccine: Received Pneumococcal Vaccination: No Immunization Notes: doesn't remember when last tetanus shot was Implantable Devices No devices added Hospitalization / Surgery History Type of  Hospitalization/Surgery cellulitis in leg left leg vein ablation Family and Social History Cancer: No; Diabetes: Yes - Father; Heart Disease: No; Hereditary Spherocytosis: No; Hypertension: Yes - Mother; Kidney Disease: No; Lung Disease: No; Stroke: No; Thyroid Problems: No; Tuberculosis: No; Former smoker; Marital Status - Married; Alcohol Use: Moderate; Drug Use: No History; Caffeine Use: Daily; Financial Concerns: No; Food, Clothing or Shelter Needs: No; Support System Lacking: No; Transportation Concerns: No [place]rossa, Daryan E ([id number]) [phone].pdf Page 37 of 38 Electronic Signature(s) Signed: 01/04/2023 12:27:44 PM By: Fredirick Maudlin MD FACS Entered By: Fredirick Maudlin on 01/04/2023 09:16:09 -------------------------------------------------------------------------------- SuperBill Details Patient Name: Date of Service: [place], [name] 01/04/2023 Medical Record Number: [id number] Patient Account Number: [id number] Date of Birth/Sex: Treating RN: [birth year] (37 y.o. 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 L98.492 Non-pressure chronic ulcer of skin of other sites with fat layer exposed Facility Procedures : CPT4 Code: [id number] Description: 11042 - DEB SUBQ TISSUE 20 SQ CM/< ICD-10 Diagnosis  Description L97.518 Non-pressure chronic ulcer of other part of right foot with other specified sever Modifier: ity Quantity: 1 : CPT4 Code: [id number] Description: [id number] - DEBRIDE WOUND 1ST 20 SQ CM OR < ICD-10 Diagnosis Description L98.492 Non-pressure chronic ulcer of skin of other sites with fat layer exposed L97.312 Non-pressure chronic ulcer of right 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 L97.322  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, stage 3 Modifier: Quantity: 1 Physician Procedures : CPT4 Code Description Modifier [id number] - WC PHYS LEVEL 3 - EST PT 25 ICD-10 Diagnosis Description 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  L98.492 Non-pressure chronic ulcer of skin of other sites with fat layer exposed 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, stage 3 Quantity: 1 : [id number] 11042 - WC PHYS SUBQ TISS 20 SQ CM ICD-10 Diagnosis Description L97.518 Non-pressure chronic ulcer of other part of right foot with other specified severity Quantity: 1 : [id number] - WC PHYS DEBR WO ANESTH 20 SQ CM ICD-10 Diagnosis Description L98.492 Non-pressure chronic ulcer of skin of other sites with fat layer exposed L97.312 Non-pressure chronic ulcer of right 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 L97.322 Non-pressure  chronic ulcer of left 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

## 2023-01-05 NOTE — Progress Notes (Signed)
Moritz, Olga E ([id number]) [phone].pdf Page 1 of 18 Visit Report for 01/04/2023 Arrival Information Details Patient Name: Date of Service: [name], [name] 01/04/2023 8:15 [name] Medical Record Number: [id number] Patient Account Number: [id number] Date of Birth/Sex: Treating RN: 1985/12/26 (37 y.o. [name] Primary Care Archer Vise: [place], [place] Other Clinician: Referring Morganne Haile: Treating Jayshaun Phillips/Extender: Cornelia Copa [name]: 66 Visit Information History Since Last Visit Added or deleted any medications: No Patient Arrived: Wheel Chair Any new allergies or adverse reactions: No Arrival Time: 08:13 Had [name] fall or experienced change in No Accompanied By: self activities of daily living that may affect Transfer Assistance: None risk of falls: Patient Identification Verified: Yes Signs or symptoms of abuse/neglect since last visito No Secondary Verification Process Completed: Yes Hospitalized since last visit: No Patient Requires Transmission-Based Precautions: No Implantable device outside of the clinic excluding No Patient Has Alerts: Yes cellular tissue based products placed in the center Patient Alerts: R ABI = 1.0 since last visit: L ABI = 1.1 Has Dressing in Place as Prescribed: Yes Has Compression in Place as Prescribed: Yes Pain Present Now: No Electronic Signature(s) Signed: 01/04/2023 5:50:17 PM By: Baruch Gouty RN, BSN Entered By: Baruch Gouty on 01/04/2023 08:17:19 -------------------------------------------------------------------------------- Encounter Discharge Information Details Patient Name: Date of Service: [name], [name] 01/04/2023 8:15 [name] Medical Record Number: [id number] Patient Account Number: [id number] Date of Birth/Sex: Treating RN: 11-10-1986 (38 y.o. [name] Primary Care Deo Mehringer: [place], [place] Other Clinician: Referring Roniqua Kintz: Treating Shiree Altemus/Extender: Cornelia Copa [name]: 365 Encounter Discharge Information Items Post Procedure Vitals Discharge Condition: Stable Temperature (F): 98.4 Ambulatory Status: Wheelchair Pulse (bpm): 116 Discharge Destination: Home Respiratory Rate (breaths/min): 18 Transportation: Private Auto Blood Pressure (mmHg): 144/84 Accompanied By: self Schedule Follow-up Appointment: Yes Clinical Summary of Care: Patient Declined Electronic Signature(s) Signed: 01/04/2023 5:50:17 PM By: Baruch Gouty RN, BSN Entered By: Baruch Gouty on 01/04/2023 12:32:53 [name], [name] ([id number].pdf Page 2 of 18 -------------------------------------------------------------------------------- Lower Extremity Assessment Details Patient Name: Date of Service: [name], [name] 01/04/2023 8:15 [name] Medical Record Number: [id number] Patient Account Number: [id number] Date of Birth/Sex: Treating RN: [birth year] (37 y.o. [name] Primary Care Jonesha Tsuchiya: [place], [place] Other Clinician: Referring Deondra Wigger: Treating Yacoub Diltz/Extender: Graciela Husbands, [name]: 365 Edema Assessment Assessed: [Left: No] [Right: No] Edema: [Left: Yes] [Right: Yes] Calf Left: Right: Point of Measurement: 33 cm From Medial Instep 28 cm 33.5 cm Ankle Left: Right: Point of Measurement: 10 cm From Medial Instep 24.7 cm 24.5 cm Vascular Assessment Pulses: Dorsalis Pedis Palpable: [Left:No] [Right:Yes] Electronic Signature(s) Signed: 01/04/2023 5:50:17 PM By: Baruch Gouty RN, BSN Entered By: Baruch Gouty on 01/04/2023 08:32:14 -------------------------------------------------------------------------------- Multi Wound Chart Details Patient Name: Date of Service: [name], [name] 01/04/2023 8:15 [name] Medical Record Number: [id number] Patient Account Number: [id number] Date of Birth/Sex: Treating RN: 1985/12/21 (37 y.o. 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 Primary Etiology: Sleep Apnea, Hypertension, Paraplegia Sleep Apnea, Hypertension, Paraplegia Sleep Apnea, Hypertension, Paraplegia Comorbid History: 03/27/2021 07/11/2021 05/24/2022 Date Acquired: 92 76 31  Weeks of Treatment: Open Open Open Wound Status: No No No Wound Recurrence: No Yes No Clustered Wound: 1.1x4.2x0.1 0.1x0.1x0.1 0x0x0 Measurements L x W x D (cm) 3.629 0.008 0 [name] (cm) : rea 0.363 0.001 0 Volume (cm) : -92.50% 99.90% 100.00% % Reduction in [name] rea: -93.10% 99.80% 100.00% % Reduction in Volume: Full Thickness Without Exposed Full Thickness Without Exposed Category/Stage III Classification: Support Structures Support Structures Medium Small None Present Exudate [name] mount: Purulent Serous [name] Exudate Type: yellow, brown, green amber [name] Exudate Color: Distinct, outline attached Flat and Intact [name] Wound Margin: Small (1-33%) Small (1-33%) None Present (0%) Granulation [name] mount: Red Red [name] Granulation Quality: Large (67-100%) None Present (0%) None Present (0%) Necrotic [name] mount: Adherent Slough [name] [name] Necrotic Tissue: Fat Layer (Subcutaneous Tissue): Yes Fat Layer (Subcutaneous Tissue): Yes Fascia: No Exposed Structures: Fascia: No Fascia: No Fat Layer (Subcutaneous Tissue): No Tendon: No Tendon: No Tendon: No Muscle: No Muscle: No Muscle: No Joint: No Joint: No Joint: No Bone:  No Bone: No Bone: No Small (1-33%) Large (67-100%) Large (67-100%) Epithelialization: Debridement - Excisional [name] [name] Debridement: Pre-procedure Verification/Time Out 09:00 [name] [name] Taken: Subcutaneous, Slough [name] [name] Tissue Debrided: Skin/Subcutaneous Tissue [name] [name] Level: 4.62 [name] [name] Debridement [name] (sq cm): rea Curette [name] [name] Instrument: Minimum [name] [name] Bleeding: Pressure [name] [name] Hemostasis [name] chieved: Insensate [name] [name] Procedural Pain: Insensate [name] [name] Post Procedural Pain: Procedure was tolerated well [name] [name] Debridement Treatment Response: 1.1x4.2x0.1 [name] [name] Post Debridement Measurements L x W x D (cm) 0.363 [name] [name] Post Debridement Volume: (cm) [name] [name] [name] Post Debridement Stage: No Abnormalities Noted No Abnormalities Noted Scarring: Yes Periwound Skin Texture: Excoriation: No Dry/Scaly: Yes Dry/Scaly: Yes Dry/Scaly: Yes Periwound Skin Moisture: Maceration: No Maceration: No Erythema: No Erythema: Yes No Abnormalities Noted Periwound Skin Color: [name] [name] [name] Erythema Location: [name] Measured: 6cm [name] Erythema Measurement: [name] No Change [name] Erythema Change: No Abnormality No Abnormality No Abnormality Temperature: Debridement [name] [name] Procedures Performed: Wound Number: [id number] Photos: Right, Anterior Ankle Left, Lateral Lower Leg Right, Lateral Lower Leg Wound Location: Pressure Injury Gradually Appeared Gradually Appeared Wounding Event: Venous 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 Primary Etiology: Sleep Apnea, Hypertension, Paraplegia Sleep Apnea, Hypertension, Paraplegia Sleep Apnea, Hypertension, Paraplegia Comorbid History: 06/15/2022 06/22/2022 12/06/2022 Date Acquired: 29 27 2 $ Weeks of Treatment: Healed - Epithelialized Open Open Wound Status: No No No Wound Recurrence: No No No Clustered Wound: 0x0x0 1x0.6x0.1 0.5x0.7x0.1 Measurements L x W x D (cm) 0 0.471 0.275 [name] (cm) : rea 0 0.047 0.027 Volume (cm) : 100.00%  91.60% 16.70% % Reduction in [name] rea: 100.00% 91.60% 18.20% % Reduction in Volume: Full Thickness Without Exposed Full Thickness Without Exposed Full Thickness Without Exposed Classification: Support Structures Support Structures Support Structures Sherry, [name] ([id number].pdf Page 4 of 18 None Present Medium Medium Exudate [name] mount: [name] Serosanguineous Serosanguineous Exudate Type: [name] red, brown red, brown Exudate Color: Flat and Intact Distinct, outline attached Flat and Intact Wound Margin: None Present (0%) Large (67-100%) None Present (0%) Granulation Amount: [name] Red [name] Granulation Quality: None Present (0%) Small (1-33%) Large (67-100%) Necrotic Amount: [name] Adherent Slough Eschar, Adherent Slough Necrotic Tissue: Fat Layer (Subcutaneous Tissue): Yes Fat Layer (Subcutaneous Tissue): Yes Fat Layer (Subcutaneous Tissue): Yes Exposed Structures: Fascia: No Fascia: No Fascia: No Tendon: No Tendon: No Tendon: No Muscle: No Muscle: No Muscle: No Joint: No Joint: No Joint: No Bone: No Bone: No Bone: No Large (67-100%)  Medium (34-66%) Medium (34-66%) Epithelialization: [name] [name] Debridement - Selective/Open Wound Debridement: Pre-procedure Verification/Time Out [name] [name] 09:00 Taken: [name] [name] Necrotic/Eschar Tissue Debrided: [name] [name] Non-Viable Tissue Level: [name] [name] 0.35 Debridement [name] (sq cm): rea [name] [name] Curette Instrument: [name] [name] Minimum Bleeding: [name] [name] Pressure Hemostasis [name] chieved: [name] [name] Insensate Procedural Pain: [name] [name] Insensate Post Procedural Pain: [name] [name] Procedure was tolerated well Debridement Treatment Response: [name] [name] 0.5x0.7x0.1 Post Debridement Measurements L x W x D (cm) [name] [name] 0.027 Post Debridement Volume: (cm) [name] [name] [name] Post Debridement Stage: Rash: Yes No Abnormalities Noted No Abnormalities Noted Periwound Skin Texture: Dry/Scaly: Yes Dry/Scaly: Yes No Abnormalities Noted Periwound  Skin Moisture: Erythema: No Erythema: Yes No Abnormalities Noted Periwound Skin Color: [name] Red Streaks [name] Erythema Location: [name] [name] [name] Erythema Measurement: [name] No Change [name] Erythema Change: No Abnormality No Abnormality No Abnormality Temperature: [name] [name] Debridement Procedures Performed: Wound Number: [id number] [name] Photos: [name] Right Gluteus Left, Proximal, Lateral Lower Leg [name] Wound Location: Pressure Injury Pressure Injury [name] Wounding Event: Pressure Ulcer Pressure Ulcer [name] Primary Etiology: Sleep Apnea, Hypertension, Paraplegia Sleep Apnea, Hypertension, Paraplegia [name] Comorbid History: 12/13/2022 12/21/2022 [name] Date Acquired: 2 0 [name] Weeks of Treatment: Open Open [name] Wound Status: No No [name] Wound Recurrence: No No [name] Clustered Wound: 3.1x0.7x0.1 2.7x2x0.1 [name] Measurements L x W x D (cm) 1.704 4.241 [name] [name] (cm) : rea 0.17 0.424 [name] Volume (cm) : 57.50% [name] [name] % Reduction in [name] rea: 57.60% [name] [name] % Reduction in Volume: Category/Stage III Category/Stage III [name] Classification: Medium Medium [name] Exudate [name] mount: Serosanguineous Serosanguineous [name] Exudate Type: red, brown red, brown [name] Exudate Color: Flat and Intact Distinct, outline attached [name] Wound Margin: Medium (34-66%) Medium (34-66%) [name] Granulation [name] mount: Red Pink [name] Granulation Quality: Medium (34-66%) Medium (34-66%) [name] Necrotic [name] mount: Adherent Slough Adherent Slough [name] Necrotic Tissue: Fat Layer (Subcutaneous Tissue): Yes Fat Layer (Subcutaneous Tissue): Yes [name] Exposed Structures: Fascia: No Fascia: No Tendon: No Tendon: No Muscle: No Muscle: No Joint: No Joint: No Bone: No Bone: No Small (1-33%) None [name] Epithelialization: Debridement - Selective/Open Wound Debridement - Selective/Open Wound [name] Debridement: Pre-procedure Verification/Time Out 09:00 09:00 [name] Taken: [facility] [name] Tissue Debrided: Krah, [name] ([id number] Page 5 of 18 Non-Viable Tissue Non-Viable Tissue [name] Level: 2.17 5.4 [name] Debridement [name] (sq cm): rea Curette Curette [name] Instrument: Minimum Minimum [name] Bleeding: Pressure Pressure [name] Hemostasis [name] chieved: Insensate Insensate [name] Procedural Pain: Insensate Insensate [name] Post Procedural Pain: Procedure was tolerated well Procedure was tolerated well [name] Debridement Treatment Response: 3.1x0.7x0.1 2.7x2x0.1 [name] Post Debridement Measurements L x W x D (cm) 0.17 0.424 [name] Post Debridement Volume: (cm) Category/Stage III Category/Stage III [name] Post Debridement Stage: No Abnormalities Noted No Abnormalities Noted [name] Periwound Skin Texture: No Abnormalities Noted No Abnormalities Noted [name] Periwound Skin Moisture: No Abnormalities Noted No Abnormalities Noted [name] Periwound Skin Color: [name] [name] [name] Erythema Location: [name] [name] [name] Erythema Measurement: [name] [name] [name] Erythema Change: No Abnormality No Abnormality [name] Temperature: Debridement Debridement [name] Procedures Performed: Treatment Notes Electronic Signature(s) Signed: 01/04/2023 9:12:21 AM By: Fredirick Maudlin MD FACS Entered By: Fredirick Maudlin on 01/04/2023 09:12:20 -------------------------------------------------------------------------------- Multi-Disciplinary Care Plan Details Patient Name: Date of Service: [name], [name] 01/04/2023 8:15 [name] Medical Record Number: [id number] Patient Account Number: [id number] Date of Birth/Sex: Treating RN: Jul 12, 1986 (37 y.o. [name] Primary Care Myrical Andujo: [place], [place] Other Clinician: Referring Danene Montijo: Treating Ina Scrivens/Extender: Graciela Husbands, [name]  [name]: [place] reviewed with physician Active Inactive Pressure Nursing Diagnoses: Knowledge deficit related to management of pressures ulcers Potential for impaired tissue integrity related to pressure, friction, moisture,  and shear Goals: Patient will remain free from development of additional pressure ulcers Date Initiated: 06/21/2018 Date Inactivated: 02/12/2019 Target Resolution Date: 02/16/2019 Unmet Reason: pressure ulcer Goal Status: Unmet reopened on left ischium Patient/caregiver will verbalize risk factors for pressure ulcer development Date Initiated: 06/21/2018 Date Inactivated: 05/28/2019 Target Resolution Date: 06/01/2019 Goal Status: Met Patient/caregiver will verbalize understanding of pressure ulcer management Date Initiated: 06/01/2022 Target Resolution Date: 01/18/2023 Goal Status: Active Interventions: Assess: immobility, friction, shearing, incontinence upon admission and as needed Assess offloading mechanisms upon admission and as needed Assess potential for pressure ulcer upon admission and as needed Treatment Activities: Pressure reduction/relief device ordered : 12/05/2017 Notes: Delahoz, Laren E ([id number] Page 6 of 18 Wound/Skin Impairment Nursing Diagnoses: Impaired tissue integrity Knowledge deficit related to ulceration/compromised skin integrity Goals: Patient/caregiver will verbalize understanding of skin care regimen Date Initiated: 06/01/2022 Target Resolution Date: 01/18/2023 Goal Status: Active Ulcer/skin breakdown will have [name] volume reduction of 30% by week 4 Date Initiated: 06/01/2022 Date Inactivated: 06/29/2022 Target Resolution Date: 06/29/2022 Unmet Reason: complex wounds, Goal Status: Unmet infection Interventions: Assess patient/caregiver ability to obtain necessary supplies Assess ulceration(s) every visit Provide education on ulcer and skin care Notes: 02/02/21: Complex Care, ongoing. Electronic Signature(s) Signed: 01/04/2023 5:50:17 PM By: Baruch Gouty RN, BSN Entered By: Baruch Gouty on 01/04/2023 08:54:25 -------------------------------------------------------------------------------- Pain Assessment  Details Patient Name: Date of Service: [name], [name] 01/04/2023 8:15 [name] Medical Record Number: [id number] Patient Account Number: [id number] Date of Birth/Sex: Treating RN: 1986/09/09 (37 y.o. [name] Primary Care Anagabriela Jokerst: [place], [place] Other Clinician: Referring Tomy Khim: Treating Derin Granquist/Extender: Cornelia Copa [name]: 365 Active Problems Location of Pain Severity and Description of Pain Patient Has Paino No Site Locations Rate the pain. Current Pain Level: 0 Pain Management and Medication Current Pain Management: Electronic Signature(s) Signed: 01/04/2023 5:50:17 PM By: Baruch Gouty RN, BSN Entered By: Baruch Gouty on 01/04/2023 08:17:36 [name], [name] ([id number].pdf Page 7 of 18 -------------------------------------------------------------------------------- Patient/Caregiver Education Details Patient Name: Date of Service: [name], [place] 2/13/2024andnbsp8:15 [name] Medical Record Number: [id number] Patient Account Number: [id number] Date of Birth/Gender: Treating RN: 1985/12/29 (37 y.o. [name] Primary Care Physician: Janine Limbo Other Clinician: Referring Physician: Treating Physician/Extender: Cornelia Copa [name]: 365 Education Assessment Education Provided To: Patient Education Topics Provided Pressure: Methods: Explain/Verbal Responses: Reinforcements needed, State content correctly Wound/Skin Impairment: Methods: Explain/Verbal Responses: Reinforcements needed, State content correctly Electronic Signature(s) Signed: 01/04/2023 5:50:17 PM By: Baruch Gouty RN, BSN Entered By: Baruch Gouty on 01/04/2023 08:54:59 -------------------------------------------------------------------------------- Wound Assessment Details Patient Name: Date of Service: [name], [name] 01/04/2023 8:15 [name] Medical Record Number: [id number] Patient Account  Number: [id number] Date of Birth/Sex: Treating RN: 1986/04/09 (37 y.o. [name] Primary Care Sabre Romberger: [place], [place] Other Clinician: Referring Mamie Hundertmark: Treating Janaki Exley/Extender: Graciela Husbands, [name]: 365 Wound Status Wound Number: [id number] Primary Etiology: Venous Leg Ulcer Wound Location: Right, Dorsal Foot Wound Status: Open Wounding Event: Gradually Appeared Comorbid History: Sleep Apnea, Hypertension, Paraplegia Date Acquired: 03/27/2021 Weeks Of Treatment: 92 Clustered Wound: No Photos Wilson, Dierks E ([id number].pdf Page 8 of 18 Wound Measurements Length: (cm) 1.1 Width: (cm) 4.2 Depth: (cm) 0.1 Area: (cm) 3.629 Volume: (cm) 0.363 % Reduction in Area: -92.5% %  Reduction in Volume: -93.1% Epithelialization: Small (1-33%) Tunneling: No Undermining: No Wound Description Classification: Full Thickness Without Exposed Support Structures Wound Margin: Distinct, outline attached Exudate Amount: Medium Exudate Type: Purulent Exudate Color: yellow, brown, green Foul Odor After Cleansing: No Slough/Fibrino Yes Wound Bed Granulation Amount: Small (1-33%) Exposed Structure Granulation Quality: Red Fascia Exposed: No Necrotic Amount: Large (67-100%) Fat Layer (Subcutaneous Tissue) Exposed: Yes Necrotic Quality: Adherent Slough Tendon Exposed: No Muscle Exposed: No Joint Exposed: No Bone Exposed: No Periwound Skin Texture Texture Color No Abnormalities Noted: Yes No Abnormalities Noted: Yes Moisture Temperature / Pain No Abnormalities Noted: No Temperature: No Abnormality Dry / Scaly: Yes Treatment Notes Wound #52 (Foot) Wound Laterality: Dorsal, Right Cleanser Soap and Water Discharge Instruction: May shower and wash wound with dial antibacterial soap and water prior to dressing change. Wound Cleanser Discharge Instruction: Cleanse the wound with wound cleanser prior to applying [name] clean  dressing using gauze sponges, not tissue or cotton balls. Peri-Wound Care Sween Lotion (Moisturizing lotion) Discharge Instruction: Apply Aquaphor moisturizing lotion as directed Topical Primary Dressing Sorbalgon AG Dressing 2x2 (in/in) Discharge Instruction: Apply to wound bed as instructed Secondary Dressing Woven Gauze Sponge, Non-Sterile 4x4 in Discharge Instruction: Apply over primary dressing as directed. Secured With [employer], 4.5x3.1 (in/yd) Discharge Instruction: Secure with Kerlix as directed. 55M Medipore H Soft Cloth Surgical T ape, 4 x 10 (in/yd) Discharge Instruction: Secure with tape as directed. Stjames, Hendryx E ([id number]) [phone].pdf Page 9 of 18 Compression Wrap Compression Stockings Circaid Juxta Lite Compression Wrap Quantity: 1 Right Leg Compression Amount: 30-40 mmHg Discharge Instruction: Apply Circaid Juxta Lite Compression Wrap daily as instructed. Apply first thing in the morning, remove at night before bed. Add-Ons Electronic Signature(s) Signed: 01/04/2023 5:50:17 PM By: Baruch Gouty RN, BSN Entered By: Baruch Gouty on 01/04/2023 08:37:47 -------------------------------------------------------------------------------- Wound Assessment Details Patient Name: Date of Service: [name], [name] 01/04/2023 8:15 [name] Medical Record Number: [id number] Patient Account Number: [id number] Date of Birth/Sex: Treating RN: [birth year] (37 y.o. [name] Primary Care Danaysia Rader: [place], [place] Other Clinician: Referring Oseas Detty: Treating Ariam Mol/Extender: Graciela Husbands, [name]: 365 Wound Status Wound Number: 56 Primary Etiology: Neuropathic Ulcer-Non Diabetic Wound Location: Left, Dorsal Foot Wound Status: Open Wounding Event: Gradually Appeared Comorbid History: Sleep Apnea, Hypertension, Paraplegia Date Acquired: 07/11/2021 Weeks Of Treatment: 76 Clustered Wound: Yes Photos Wound  Measurements Length: (cm) 0.1 Width: (cm) 0.1 Depth: (cm) 0.1 Area: (cm) 0.008 Volume: (cm) 0.001 % Reduction in Area: 99.9% % Reduction in Volume: 99.8% Epithelialization: Large (67-100%) Tunneling: No Undermining: No Wound Description Classification: Full Thickness Without Exposed Support Structures Wound Margin: Flat and Intact Exudate Amount: Small Exudate Type: Serous Exudate Color: amber Foul Odor After Cleansing: No Slough/Fibrino Yes Wound Bed Granulation Amount: Small (1-33%) Exposed Structure Granulation Quality: Red Fascia Exposed: No Necrotic Amount: None Present (0%) Fat Layer (Subcutaneous Tissue) Exposed: Yes Tendon Exposed: No Muscle Exposed: No Joint Exposed: No Go, Mikael E ([id number].pdf Page 10 of 18 Bone Exposed: No Periwound Skin Texture Texture Color No Abnormalities Noted: Yes No Abnormalities Noted: No Erythema: Yes Moisture Erythema Measurement: Measured No Abnormalities Noted: No 6 cm Dry / Scaly: Yes Erythema Change: No Change Maceration: No Temperature / Pain Temperature: No Abnormality Treatment Notes Wound #56 (Foot) Wound Laterality: Dorsal, Left Cleanser Soap and Water Discharge Instruction: May shower and wash wound with dial antibacterial soap and water prior to dressing change. Wound Cleanser Discharge Instruction: Cleanse the wound with wound cleanser prior to  applying [name] clean dressing using gauze sponges, not tissue or cotton balls. Peri-Wound Care Sween Lotion (Moisturizing lotion) Discharge Instruction: Apply Aquaphor moisturizing lotion as directed Topical Primary Dressing Sorbalgon AG Dressing 2x2 (in/in) Discharge Instruction: Apply to wound bed as instructed Secondary Dressing Woven Gauze Sponge, Non-Sterile 4x4 in Discharge Instruction: Apply over primary dressing as directed. Secured With [employer], 4.5x3.1 (in/yd) Discharge Instruction: Secure with Kerlix as  directed. 87M Medipore H Soft Cloth Surgical T ape, 4 x 10 (in/yd) Discharge Instruction: Secure with tape as directed. Compression Wrap Compression Stockings Add-Ons Electronic Signature(s) Signed: 01/04/2023 5:50:17 PM By: Baruch Gouty RN, BSN Entered By: Baruch Gouty on 01/04/2023 08:39:10 -------------------------------------------------------------------------------- Wound Assessment Details Patient Name: Date of Service: [name], [name] 01/04/2023 8:15 [name] Medical Record Number: [id number] Patient Account Number: [id number] Date of Birth/Sex: Treating RN: Mar 31, 1986 (37 y.o. [name] Primary Care Tommye Lehenbauer: [place], [place] Other Clinician: Referring Analyssa Downs: Treating Amika Tassin/Extender: Graciela Husbands, [name]: 365 Wound Status Wound Number: 65 Primary Etiology: Pressure Ulcer Wound Location: Left Upper Leg Wound Status: Open Wounding Event: Pressure Injury Comorbid History: Sleep Apnea, Hypertension, Paraplegia Date Acquired: 05/24/2022 Troost, Mychal E ([id number]) [phone].pdf Page 11 of 18 Weeks Of Treatment: 31 Clustered Wound: No Photos Wound Measurements Length: (cm) Width: (cm) Depth: (cm) Area: (cm) Volume: (cm) 0 % Reduction in Area: 100% 0 % Reduction in Volume: 100% 0 Epithelialization: Large (67-100%) 0 Tunneling: No 0 Undermining: No Wound Description Classification: Category/Stage III Exudate Amount: None Present Foul Odor After Cleansing: No Slough/Fibrino No Wound Bed Granulation Amount: None Present (0%) Exposed Structure Necrotic Amount: None Present (0%) Fascia Exposed: No Fat Layer (Subcutaneous Tissue) Exposed: No Tendon Exposed: No Muscle Exposed: No Joint Exposed: No Bone Exposed: No Periwound Skin Texture Texture Color No Abnormalities Noted: No No Abnormalities Noted: Yes Excoriation: No Temperature / Pain Scarring: Yes Temperature: No Abnormality Moisture No  Abnormalities Noted: No Dry / Scaly: Yes Maceration: No Electronic Signature(s) Signed: 01/04/2023 5:50:17 PM By: Baruch Gouty RN, BSN Entered By: Baruch Gouty on 01/04/2023 08:52:21 -------------------------------------------------------------------------------- Wound Assessment Details Patient Name: Date of Service: Darius, [name] 01/04/2023 8:15 [name] Medical Record Number: [id number] Patient Account Number: [id number] Date of Birth/Sex: Treating RN: [birth year] (37 y.o. [name] Primary Care Antrell Tipler: [place], [place] Other Clinician: Referring Vitoria Conyer: Treating Casy Brunetto/Extender: Graciela Husbands, [name]: 365 Wound Status Wound Number: 66 Primary Etiology: Venous Leg Ulcer Wound Location: Right, Anterior Ankle Wound Status: Healed - Epithelialized Wounding Event: Pressure Injury Comorbid History: Sleep Apnea, Hypertension, Paraplegia Kleeman, Jahquan E ([id number].pdf Page 12 of 18 Date Acquired: 06/15/2022 Weeks Of Treatment: 29 Clustered Wound: No Photos Wound Measurements Length: (cm) Width: (cm) Depth: (cm) Area: (cm) Volume: (cm) 0 % Reduction in Area: 100% 0 % Reduction in Volume: 100% 0 Epithelialization: Large (67-100%) 0 Tunneling: No 0 Undermining: No Wound Description Classification: Full Thickness Without Exposed Support Structures Wound Margin: Flat and Intact Exudate Amount: None Present Foul Odor After Cleansing: No Slough/Fibrino No Wound Bed Granulation Amount: None Present (0%) Exposed Structure Necrotic Amount: None Present (0%) Fascia Exposed: No Fat Layer (Subcutaneous Tissue) Exposed: Yes Tendon Exposed: No Muscle Exposed: No Joint Exposed: No Bone Exposed: No Periwound Skin Texture Texture Color No Abnormalities Noted: No No Abnormalities Noted: Yes Rash: Yes Temperature / Pain Temperature: No Abnormality Moisture No Abnormalities Noted: No Dry / Scaly:  Yes Electronic Signature(s) Signed: 01/04/2023 5:50:17 PM By: Baruch Gouty RN, BSN Entered By: Baruch Gouty  on 01/04/2023 08:40:04 -------------------------------------------------------------------------------- Wound Assessment Details Patient Name: Date of Service: Sox, [name] 01/04/2023 8:15 [name] Medical Record Number: [id number] Patient Account Number: [id number] Date of Birth/Sex: Treating RN: [birth year] (37 y.o. [name] Primary Care Shajuana Mclucas: [place], [place] Other Clinician: Referring Grissel Tyrell: Treating Chris Cripps/Extender: Graciela Husbands, [name]: 365 Wound Status Wound Number: 67 Primary Etiology: Venous Leg Ulcer Wound Location: Left, Lateral Lower Leg Wound Status: Open Wounding Event: Gradually Appeared Comorbid History: Sleep Apnea, Hypertension, Paraplegia Date Acquired: 06/22/2022 Much, Marton E ([id number]) [phone].pdf Page 13 of 18 Weeks Of Treatment: 27 Clustered Wound: No Photos Wound Measurements Length: (cm) 1 Width: (cm) 0.6 Depth: (cm) 0.1 Area: (cm) 0.471 Volume: (cm) 0.047 % Reduction in Area: 91.6% % Reduction in Volume: 91.6% Epithelialization: Medium (34-66%) Tunneling: No Undermining: No Wound Description Classification: Full Thickness Without Exposed Support Structures Wound Margin: Distinct, outline attached Exudate Amount: Medium Exudate Type: Serosanguineous Exudate Color: red, brown Foul Odor After Cleansing: No Slough/Fibrino No Wound Bed Granulation Amount: Large (67-100%) Exposed Structure Granulation Quality: Red Fascia Exposed: No Necrotic Amount: Small (1-33%) Fat Layer (Subcutaneous Tissue) Exposed: Yes Necrotic Quality: Adherent Slough Tendon Exposed: No Muscle Exposed: No Joint Exposed: No Bone Exposed: No Periwound Skin Texture Texture Color No Abnormalities Noted: Yes No Abnormalities Noted: No Erythema: Yes Moisture Erythema Location: Red  Streaks No Abnormalities Noted: No Erythema Change: No Change Dry / Scaly: Yes Temperature / Pain Temperature: No Abnormality Treatment Notes Wound #67 (Lower Leg) Wound Laterality: Left, Lateral Cleanser Soap and Water Discharge Instruction: May shower and wash wound with dial antibacterial soap and water prior to dressing change. Wound Cleanser Discharge Instruction: Cleanse the wound with wound cleanser prior to applying [name] clean dressing using gauze sponges, not tissue or cotton balls. Peri-Wound Care Triamcinolone 15 (g) Discharge Instruction: Use triamcinolone 15 (g) as directed Sween Lotion (Moisturizing lotion) Discharge Instruction: Apply Aquaphor moisturizing lotion as directed Topical Primary Dressing Sorbalgon AG Dressing 2x2 (in/in) Discharge Instruction: Apply to wound bed as instructed Secondary Dressing Meanor, Abe E ([id number].pdf Page 14 of 18 Woven Gauze Sponge, Non-Sterile 4x4 in Discharge Instruction: Apply over primary dressing as directed. Secured With [employer], 4.5x3.1 (in/yd) Discharge Instruction: Secure with Kerlix as directed. 75M Medipore H Soft Cloth Surgical T ape, 4 x 10 (in/yd) Discharge Instruction: Secure with tape as directed. Compression Wrap Compression Stockings Add-Ons Electronic Signature(s) Signed: 01/04/2023 5:50:17 PM By: Baruch Gouty RN, BSN Entered By: Baruch Gouty on 01/04/2023 08:41:13 -------------------------------------------------------------------------------- Wound Assessment Details Patient Name: Date of Service: Ressel, [name] 01/04/2023 8:15 [name] Medical Record Number: [id number] Patient Account Number: [id number] Date of Birth/Sex: Treating RN: 08/29/1986 (37 y.o. [name] Primary Care Alaisa Moffitt: [name], [name] Other Clinician: Referring Francisco Eyerly: Treating Mikai Meints/Extender: Graciela Husbands, [name]: 365 Wound Status Wound Number:  71 Primary Etiology: Venous Leg Ulcer Wound Location: Right, Lateral Lower Leg Wound Status: Open Wounding Event: Gradually Appeared Comorbid History: Sleep Apnea, Hypertension, Paraplegia Date Acquired: 12/06/2022 Weeks Of Treatment: 2 Clustered Wound: No Photos Wound Measurements Length: (cm) 0.5 Width: (cm) 0.7 Depth: (cm) 0.1 Area: (cm) 0.275 Volume: (cm) 0.027 % Reduction in Area: 16.7% % Reduction in Volume: 18.2% Epithelialization: Medium (34-66%) Tunneling: No Undermining: No Wound Description Classification: Full Thickness Without Exposed Support Structures Wound Margin: Flat and Intact Exudate Amount: Medium Exudate Type: Serosanguineous Exudate Color: red, brown Foul Odor After Cleansing: No Slough/Fibrino Yes Wound Bed Granulation Amount: None Present (0%) Exposed Structure Farooq, Brandn  E ([id number].pdf Page 15 of 18 Necrotic Amount: Large (67-100%) Fascia Exposed: No Necrotic Quality: Eschar, Adherent Slough Fat Layer (Subcutaneous Tissue) Exposed: Yes Tendon Exposed: No Muscle Exposed: No Joint Exposed: No Bone Exposed: No Periwound Skin Texture Texture Color No Abnormalities Noted: Yes No Abnormalities Noted: Yes Moisture Temperature / Pain No Abnormalities Noted: Yes Temperature: No Abnormality Treatment Notes Wound #71 (Lower Leg) Wound Laterality: Right, Lateral Cleanser Soap and Water Discharge Instruction: May shower and wash wound with dial antibacterial soap and water prior to dressing change. Wound Cleanser Discharge Instruction: Cleanse the wound with wound cleanser prior to applying [name] clean dressing using gauze sponges, not tissue or cotton balls. Peri-Wound Care Sween Lotion (Moisturizing lotion) Discharge Instruction: Apply Aquaphor moisturizing lotion as directed Topical Primary Dressing Sorbalgon AG Dressing 2x2 (in/in) Discharge Instruction: Apply to wound bed as instructed Secondary  Dressing Woven Gauze Sponge, Non-Sterile 4x4 in Discharge Instruction: Apply over primary dressing as directed. Secured With [employer], 4.5x3.1 (in/yd) Discharge Instruction: Secure with Kerlix as directed. 79M Medipore H Soft Cloth Surgical T ape, 4 x 10 (in/yd) Discharge Instruction: Secure with tape as directed. Compression Wrap Compression Stockings Add-Ons Electronic Signature(s) Signed: 01/04/2023 5:50:17 PM By: Baruch Gouty RN, BSN Entered By: Baruch Gouty on 01/04/2023 08:42:21 -------------------------------------------------------------------------------- Wound Assessment Details Patient Name: Date of Service: Weppler, [name] 01/04/2023 8:15 [name] Medical Record Number: [id number] Patient Account Number: [id number] Date of Birth/Sex: Treating RN: 1986/02/13 (37 y.o. [name] Primary Care Shikita Vaillancourt: [place], [place] Other Clinician: Referring Navina Wohlers: Treating Nihar Klus/Extender: Graciela Husbands, [name]: 365 Wound Status Wound Number: 72 Primary Etiology: Pressure Ulcer Wound Location: Right Gluteus Wound Status: Open Wounding Event: Pressure Injury Comorbid History: Sleep Apnea, Hypertension, Paraplegia Piscitelli, Darick E ([id number].pdf Page 16 of 18 Date Acquired: 12/13/2022 Weeks Of Treatment: 2 Clustered Wound: No Photos Wound Measurements Length: (cm) 3.1 Width: (cm) 0.7 Depth: (cm) 0.1 Area: (cm) 1.704 Volume: (cm) 0.17 % Reduction in Area: 57.5% % Reduction in Volume: 57.6% Epithelialization: Small (1-33%) Tunneling: No Undermining: No Wound Description Classification: Category/Stage III Wound Margin: Flat and Intact Exudate Amount: Medium Exudate Type: Serosanguineous Exudate Color: red, brown Foul Odor After Cleansing: No Slough/Fibrino Yes Wound Bed Granulation Amount: Medium (34-66%) Exposed Structure Granulation Quality: Red Fascia Exposed: No Necrotic Amount:  Medium (34-66%) Fat Layer (Subcutaneous Tissue) Exposed: Yes Necrotic Quality: Adherent Slough Tendon Exposed: No Muscle Exposed: No Joint Exposed: No Bone Exposed: No Periwound Skin Texture Texture Color No Abnormalities Noted: Yes No Abnormalities Noted: Yes Moisture Temperature / Pain No Abnormalities Noted: Yes Temperature: No Abnormality Treatment Notes Wound #72 (Gluteus) Wound Laterality: Right Cleanser Peri-Wound Care Topical Primary Dressing Sorbalgon AG Dressing 2x2 (in/in) Discharge Instruction: Apply to wound bed as instructed Secondary Dressing Zetuvit Plus Silicone Border Dressing 4x4 (in/in) Discharge Instruction: Apply silicone border over primary dressing as directed. Secured With Compression Wrap Compression Stockings Environmental education officer) Signed: 01/04/2023 5:50:17 PM By: Baruch Gouty RN, BSN Wisehart, [name] ([id number]) [id number] Page 17 of 18 Signed: 01/04/2023 5:50:17 PM By: Baruch Gouty RN, BSN Entered By: Baruch Gouty on 01/04/2023 08:53:09 -------------------------------------------------------------------------------- Wound Assessment Details Patient Name: Date of Service: Yount, [name] 01/04/2023 8:15 [name] Medical Record Number: [id number] Patient Account Number: [id number] Date of Birth/Sex: Treating RN: 11-09-1986 (37 y.o. [name] Primary Care Siddharth Babington: [name], [name] Other Clinician: Referring Braedan Meuth: Treating Gertie Broerman/Extender: Graciela Husbands, [name]: 365 Wound Status Wound Number: 73 Primary Etiology: Pressure Ulcer Wound  Location: Left, Proximal, Lateral Lower Leg Wound Status: Open Wounding Event: Pressure Injury Comorbid History: Sleep Apnea, Hypertension, Paraplegia Date Acquired: 12/21/2022 Weeks Of Treatment: 0 Clustered Wound: No Photos Wound Measurements Length: (cm) 2.7 Width: (cm) 2 Depth: (cm) 0.1 Area: (cm) 4.241 Volume: (cm)  0.424 % Reduction in Area: % Reduction in Volume: Epithelialization: None Tunneling: No Undermining: No Wound Description Classification: Category/Stage III Wound Margin: Distinct, outline attached Exudate Amount: Medium Exudate Type: Serosanguineous Exudate Color: red, brown Foul Odor After Cleansing: No Slough/Fibrino Yes Wound Bed Granulation Amount: Medium (34-66%) Exposed Structure Granulation Quality: Pink Fascia Exposed: No Necrotic Amount: Medium (34-66%) Fat Layer (Subcutaneous Tissue) Exposed: Yes Necrotic Quality: Adherent Slough Tendon Exposed: No Muscle Exposed: No Joint Exposed: No Bone Exposed: No Periwound Skin Texture Texture Color No Abnormalities Noted: Yes No Abnormalities Noted: Yes Moisture Temperature / Pain No Abnormalities Noted: Yes Temperature: No Abnormality Treatment Notes Wound #73 (Lower Leg) Wound Laterality: Left, Lateral, Proximal [place]oli, Marbin E ([id number] Page 18 of 18 Cleanser Peri-Wound Care Topical Primary Dressing Sorbalgon AG Dressing 2x2 (in/in) Discharge Instruction: Apply to wound bed as instructed Secondary Dressing Zetuvit Plus Silicone Border Dressing 4x4 (in/in) Discharge Instruction: Apply silicone border over primary dressing as directed. Secured With Compression Wrap Compression Stockings Environmental education officer) Signed: 01/04/2023 5:50:17 PM By: Baruch Gouty RN, BSN Entered By: Baruch Gouty on 01/04/2023 08:45:18 -------------------------------------------------------------------------------- Vitals Details Patient Name: Date of Service: Romanek, [name] 01/04/2023 8:15 [name] Medical Record Number: [id number] Patient Account Number: [id number] Date of Birth/Sex: Treating RN: 1986-05-11 (37 y.o. [name] Primary Care Garrison Michie: [place], [place] Other Clinician: Referring Iyana Topor: Treating Damiel Barthold/Extender: Graciela Husbands, [name] Weeks in  Treatment: 365 Vital Signs Time Taken: 08:17 Temperature (F): 98.4 Height (in): 70 Pulse (bpm): 116 Weight (lbs): 216 Respiratory Rate (breaths/min): 18 Body Mass Index (BMI): 31 Blood Pressure (mmHg): 144/84 Reference Range: 80 - 120 mg / dl Electronic Signature(s) Signed: 01/04/2023 5:50:17 PM By: Baruch Gouty RN, BSN Entered By: Baruch Gouty on 01/04/2023 08:19:27

## 2023-01-18 ENCOUNTER — Encounter (HOSPITAL_BASED_OUTPATIENT_CLINIC_OR_DEPARTMENT_OTHER): Payer: BC Managed Care – PPO | Admitting: General Surgery

## 2023-01-18 DIAGNOSIS — L97518 Non-pressure chronic ulcer of other part of right foot with other specified severity: Secondary | ICD-10-CM | POA: Diagnosis not present

## 2023-01-18 DIAGNOSIS — L89323 Pressure ulcer of left buttock, stage 3: Secondary | ICD-10-CM | POA: Diagnosis not present

## 2023-01-18 DIAGNOSIS — L97822 Non-pressure chronic ulcer of other part of left lower leg with fat layer exposed: Secondary | ICD-10-CM | POA: Diagnosis not present

## 2023-01-18 DIAGNOSIS — G8221 Paraplegia, complete: Secondary | ICD-10-CM | POA: Diagnosis not present

## 2023-01-18 DIAGNOSIS — Z79899 Other long term (current) drug therapy: Secondary | ICD-10-CM | POA: Diagnosis not present

## 2023-01-18 DIAGNOSIS — L98492 Non-pressure chronic ulcer of skin of other sites with fat layer exposed: Secondary | ICD-10-CM | POA: Diagnosis not present

## 2023-01-18 DIAGNOSIS — L97512 Non-pressure chronic ulcer of other part of right foot with fat layer exposed: Secondary | ICD-10-CM | POA: Diagnosis not present

## 2023-01-18 DIAGNOSIS — L97312 Non-pressure chronic ulcer of right 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: Secondary | ICD-10-CM | POA: Diagnosis not present

## 2023-01-18 DIAGNOSIS — I872 Venous insufficiency (chronic) (peripheral): Secondary | ICD-10-CM | POA: Diagnosis not present

## 2023-01-18 DIAGNOSIS — L89893 Pressure ulcer of other site, stage 3: Secondary | ICD-10-CM | POA: Diagnosis not present

## 2023-01-18 DIAGNOSIS — I10 Essential (primary) hypertension: Secondary | ICD-10-CM | POA: Diagnosis not present

## 2023-01-18 DIAGNOSIS — L97322 Non-pressure chronic ulcer of left 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: Secondary | ICD-10-CM | POA: Diagnosis not present

## 2023-01-18 DIAGNOSIS — L97511 Non-pressure chronic ulcer of other part of right foot limited to breakdown of skin: Secondary | ICD-10-CM | POA: Diagnosis not present

## 2023-01-18 DIAGNOSIS — I87332 Chronic venous hypertension (idiopathic) with ulcer and inflammation of left lower extremity: Secondary | ICD-10-CM | POA: Diagnosis not present

## 2023-01-19 NOTE — Progress Notes (Signed)
Festa, Mouhamed [name] ([id number]) [id number] Page 1 of 14 Visit Report for 01/18/2023 Arrival Information Details Patient Name: Date of Service: [name], [name] 01/18/2023 8:15 [name] Medical Record Number: [id number] Patient Account Number: [id number] Date of Birth/Sex: Treating RN: 01/06/1986 (37 y.o. [name] Primary Care Wilhelm Ganaway: [place], [place] Other Clinician: Referring Aleda Madl: Treating [place] Virrueta/Extender: Cornelia Copa [name]: 35 Visit Information History Since Last Visit Added or deleted any medications: No Patient Arrived: Wheel Chair Any new allergies or adverse reactions: No Arrival Time: 08:27 Had [name] fall or experienced change in No Accompanied By: self activities of daily living that may affect Transfer Assistance: None risk of falls: Patient Identification Verified: Yes Signs or symptoms of abuse/neglect since last visito No Secondary Verification Process Completed: Yes Hospitalized since last visit: No Patient Requires Transmission-Based Precautions: No Implantable device outside of the clinic excluding No Patient Has Alerts: Yes cellular tissue based products placed in the center Patient Alerts: R ABI = 1.0 since last visit: L ABI = 1.1 Has Dressing in Place as Prescribed: Yes Has Compression in Place as Prescribed: Yes Pain Present Now: No Electronic Signature(s) Signed: 01/18/2023 2:56:12 PM By: Baruch Gouty RN, BSN Entered By: Baruch Gouty on 01/18/2023 08:27:55 -------------------------------------------------------------------------------- Lower Extremity Assessment Details Patient Name: Date of Service: [name], [name] 01/18/2023 8:15 [name] Medical Record Number: [id number] Patient Account Number: [id number] Date of Birth/Sex: Treating RN: 1986/04/21 (37 y.o. [name] Primary Care Kitty Cadavid: [place], [place] Other Clinician: Referring Dajana Gehrig: Treating Karely Hurtado/Extender: Graciela Husbands, [name]: 367 Edema Assessment Assessed: [Left: No] [Right: No] Edema: [Left: Yes] [Right: Yes] Calf Left: Right: Point of Measurement: 33 cm From Medial Instep 29.5 cm 32.5 cm Ankle Left: Right: Point of Measurement: 10 cm From Medial Instep 25 cm 24.5 cm Vascular Assessment Pulses: Dorsalis Pedis Palpable: [Left:Yes] [Right:Yes] Electronic Signature(s) Signed: 01/18/2023 2:56:12 PM By: Baruch Gouty RN, BSN Entered By: Baruch Gouty on 01/18/2023 08:41:09 [name], [name] ([id number]) [id number] Page 2 of 14 -------------------------------------------------------------------------------- Multi Wound Chart Details Patient Name: Date of Service: [name], [name] 01/18/2023 8:15 [name] Medical Record Number: [id number] Patient Account Number: [id number] Date of Birth/Sex: Treating RN: 1986-07-19 (37 y.o. 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 Primary Etiology: Sleep Apnea, Hypertension, Paraplegia Sleep Apnea, Hypertension, Paraplegia Sleep Apnea, Hypertension, Paraplegia Comorbid History: 03/27/2021 03/27/2021 07/11/2021 Date Acquired: 94 94 78 Weeks of Treatment: Open Open Open Wound Status: No No No Wound Recurrence: No No Yes Clustered Wound: 2x5.1x0.1 2x5.1x0.1 0.5x2.8x0.1 Measurements L x W x D (cm) 8.011 8.011 1.1 [name] (cm) : rea 0.801 0.801 0.11 Volume (cm) : -325.00% -325.00%  82.30% % Reduction in [name] rea: -326.10% -326.10% 82.30% % Reduction in Volume: Full Thickness Without Exposed Full Thickness Without Exposed Full Thickness Without Exposed Classification: Support Structures Support Structures Support Structures Medium Medium Small Exudate [name] mount: Serosanguineous Serosanguineous Serous Exudate Type: red, brown red, brown amber Exudate Color: Distinct, outline attached Flat and Intact Flat and Intact Wound Margin: Small (1-33%) Small (1-33%) Small (1-33%) Granulation [name] mount: Red Red Red Granulation Quality: Large (67-100%) Large (67-100%)  Small (1-33%) Necrotic [name] mount: Fat Layer (Subcutaneous Tissue): Yes Fat Layer (Subcutaneous Tissue): Yes Fat Layer (Subcutaneous Tissue): Yes Exposed Structures: Fascia: No Fascia: No Fascia: No Tendon: No Tendon: No Tendon: No Muscle: No Muscle: No Muscle: No Joint: No Joint: No Joint: No Bone: No Bone: No Bone: No Small (1-33%) Small (1-33%) Large (67-100%) Epithelialization: Debridement - Excisional Debridement - Excisional [name] Debridement: Pre-procedure Verification/Time Out 09:15 09:15 [name] Taken: Subcutaneous, Slough Subcutaneous, Slough [name] Tissue Debrided: Skin/Subcutaneous Tissue Skin/Subcutaneous Tissue [name] Level: 10.2 10.2 [name] Debridement [name] (sq cm): rea Curette Curette [name] Instrument: Minimum Minimum [name] Bleeding: Pressure Pressure [name] Hemostasis [name] chieved: Insensate Insensate [name] Procedural Pain: Insensate Insensate [name] Post Procedural Pain: Procedure was tolerated well Procedure was tolerated well [name] Debridement Treatment Response: 2x5.1x0.1 2x5.1x0.1 [name] Post Debridement Measurements L x W x D (cm) 0.801 0.801 [name] Post Debridement Volume: (cm) [name] [name] [name] Post Debridement Stage: No Abnormalities Noted No Abnormalities Noted No Abnormalities Noted Periwound Skin Texture: Dry/Scaly: Yes Dry/Scaly: Yes Dry/Scaly: Yes Periwound Skin Moisture: Maceration:  No Erythema: No Erythema: Yes Erythema: No Periwound Skin Color: [name] [name] [name] Erythema Location: [name] [name] [name] Erythema Measurement: [name] [name] [name] Erythema Change: No Abnormality No Abnormality No Abnormality Temperature: [name], [name] ([id number]) [id number] Page 3 of 14 Debridement Debridement [name] Procedures Performed: Wound Number: [id number] Photos: Left, Lateral Lower Leg Right, Lateral Lower Leg Right Gluteus Wound Location: Gradually Appeared Gradually Appeared Pressure Injury Wounding Event: Venous Leg Ulcer Venous Leg Ulcer Pressure Ulcer Primary Etiology: Sleep Apnea, Hypertension, Paraplegia Sleep Apnea, Hypertension, Paraplegia Sleep Apnea, Hypertension, Paraplegia Comorbid History: 06/22/2022 12/06/2022 12/13/2022 Date Acquired: '29 4 4 '$ Weeks of Treatment: Open Healed - Epithelialized Healed - Epithelialized Wound Status: No No No Wound Recurrence: No No No Clustered Wound: 0.9x0.5x0.1 0x0x0 0x0x0 Measurements L x W x D (cm) 0.353 0 0 [name] (cm) : rea 0.035 0 0 Volume (cm) : 93.70% 100.00% 100.00% % Reduction in [name] rea: 93.80% 100.00% 100.00% % Reduction in Volume: Full Thickness Without Exposed Full Thickness Without Exposed Category/Stage III Classification: Support Structures Support Structures Medium None Present None Present Exudate [name] mount: Serosanguineous [name] [name] Exudate Type: red, brown [name] [name] Exudate Color: Distinct, outline attached [name] [name] Wound Margin: Large (67-100%) None Present (0%) None Present (0%) Granulation [name] mount: Red [name] [name] Granulation Quality: Small (1-33%) None Present (0%) None Present (0%) Necrotic [name] mount: Fat Layer (Subcutaneous Tissue): Yes Fascia: No Fascia: No Exposed Structures: Fascia: No Fat Layer (Subcutaneous Tissue): No Fat Layer (Subcutaneous Tissue): No Tendon: No Tendon: No Tendon: No Muscle: No Muscle: No Muscle: No Joint: No Joint: No Joint: No Bone: No Bone: No Bone:  No Medium (34-66%) Large (67-100%) Large (67-100%) Epithelialization: Debridement - Selective/Open Wound [name] [name] Debridement: Pre-procedure Verification/Time Out 09:15 [name] [name] Taken: Slough [name] [name] Tissue Debrided: Non-Viable Tissue [name] [name] Level: 0.45 [name] [name] Debridement [name] (sq cm): rea Curette [name] [name] Instrument: Minimum [name] [name] Bleeding: Pressure [name] [name] Hemostasis [name] chieved: Insensate [name] [name] Procedural Pain: Insensate [name] [name] Post Procedural Pain: Procedure was tolerated well [name] [name] Debridement Treatment Response: 0.9x0.5x0.1 [name] [name] Post Debridement Measurements L x W x D (cm) 0.035 [name] [name] Post Debridement Volume: (cm) [name] [name] [name] Post Debridement Stage: No Abnormalities Noted No Abnormalities Noted No Abnormalities Noted Periwound Skin Texture: Dry/Scaly: Yes No Abnormalities Noted No Abnormalities Noted Periwound Skin Moisture: Erythema: Yes No Abnormalities Noted No Abnormalities Noted Periwound Skin Color: Red Streaks [name] [name] Erythema Location: [name] [name]  [name] Erythema Measurement: No Change [name] [name] Erythema Change: No Abnormality No Abnormality No Abnormality Temperature: Debridement [name] [name] Procedures Performed: Wound Number: [id number] [name] Photos: [name] Left, Proximal, Lateral Lower Leg Left, Posterior Upper Leg [name] Wound Location: Pressure Injury Pressure Injury [name] Wounding Event: Pressure Ulcer Pressure Ulcer [name] Primary Etiology: Sleep Apnea, Hypertension, Paraplegia Sleep Apnea, Hypertension, Paraplegia [name] Comorbid History: 12/21/2022 01/10/2023 [name] Date Acquired: [name], [name] ([id number]) [id number] Page 4 of 14 2 0 [name] Weeks of Treatment: Open Open [name] Wound Status: No No [name] Wound Recurrence: No No [name] Clustered Wound: 1.8x1.3x0.1 10x9.5x0.1 [name] Measurements L x W x D (cm) 1.838 74.613 [name] [name] (cm) : rea 0.184 7.461 [name] Volume (cm) : 56.70% [name] [name] % Reduction in [name] rea: 56.60% [name] [name] % Reduction in  Volume: Category/Stage III Category/Stage III [name] Classification: Medium Large [name] Exudate [name] mount: Serosanguineous Serosanguineous [name] Exudate Type: red, brown red, brown [name] Exudate Color: Distinct, outline attached Flat and Intact [name] Wound Margin: Medium (34-66%) Medium (34-66%) [name] Granulation [name] mount: Pink Red [name] Granulation Quality: Medium (34-66%) Medium (34-66%) [name] Necrotic [name] mount: Fat Layer (Subcutaneous Tissue): Yes Fat Layer (Subcutaneous Tissue): Yes [name] Exposed Structures: Fascia: No Fascia: No Tendon: No Tendon: No Muscle: No Muscle: No Joint: No Joint: No Bone: No Bone: No Small (1-33%) Small (1-33%) [name] Epithelialization: Debridement - Selective/Open Wound Debridement - Selective/Open Wound [name] Debridement: Pre-procedure Verification/Time Out 09:15 09:15 [name] Taken: [facility] [name] Tissue Debrided: Non-Viable Tissue Non-Viable Tissue [name] Level: 2.34 95 [name] Debridement [name] (sq cm): rea Curette Curette [name] Instrument: Minimum Minimum [name] Bleeding: Pressure Pressure [name] Hemostasis [name] chieved: Insensate Insensate [name] Procedural Pain: Insensate Insensate [name] Post Procedural Pain: Procedure was tolerated well Procedure was tolerated well [name] Debridement Treatment Response: 1.8x1.3x0.1 10x9.5x0.1 [name] Post Debridement Measurements L x W x D (cm) 0.184 7.461 [name] Post Debridement Volume: (cm) Category/Stage III Category/Stage III [name] Post Debridement Stage: No Abnormalities Noted Scarring: Yes [name] Periwound Skin Texture: No Abnormalities Noted No Abnormalities Noted [name] Periwound Skin Moisture: Erythema: Yes No Abnormalities Noted [name] Periwound Skin Color: [name] [name] [name] Erythema Location: Measured: 2.5cm [name] [name] Erythema Measurement: [name] [name] [name] Erythema Change: No Abnormality No Abnormality [name] Temperature: Debridement Debridement [name] Procedures Performed: Treatment Notes Electronic Signature(s) Signed: 01/18/2023 9:40:29 AM By:  Fredirick Maudlin MD FACS Entered By: Fredirick Maudlin on 01/18/2023 09:40:29 -------------------------------------------------------------------------------- Multi-Disciplinary Care Plan Details Patient Name: Date of Service: Fryberger, [name] 01/18/2023 8:15 [name] Medical Record Number: [id number] Patient Account Number: [id number] Date of Birth/Sex: Treating RN: May 15, 1986 (37 y.o. [name] Primary Care Odetta Forness: [place], [place] Other Clinician: Referring Gavyn Zoss: Treating Adaiah Jaskot/Extender: Cornelia Copa [name]: [place] reviewed with physician Active Inactive Pressure Nursing Diagnoses: Knowledge deficit related to management of pressures ulcers Potential for impaired tissue integrity related to pressure, friction, moisture, and shear Goals: Giddings, Kylan [name] ([id number]) [id number] Page 5 of 14 Patient will remain free from development of additional pressure ulcers Date Initiated: 06/21/2018 Date Inactivated: 02/12/2019 Target Resolution Date: 02/16/2019 Unmet Reason: pressure ulcer Goal Status: Unmet reopened on left ischium Patient/caregiver will verbalize risk factors for pressure ulcer development Date Initiated: 06/21/2018 Date Inactivated: 05/28/2019 Target Resolution Date: 06/01/2019 Goal Status: Met Patient/caregiver will verbalize understanding of pressure ulcer management Date Initiated: 06/01/2022 Target Resolution Date: 02/15/2023 Goal Status: Active Interventions: Assess: immobility, friction, shearing, incontinence upon admission and as needed Assess offloading mechanisms upon admission and as needed Assess potential for pressure ulcer  upon admission and as needed Treatment Activities: Pressure reduction/relief device ordered : 12/05/2017 Notes: Wound/Skin Impairment Nursing Diagnoses: Impaired tissue integrity Knowledge deficit related to ulceration/compromised skin  integrity Goals: Patient/caregiver will verbalize understanding of skin care regimen Date Initiated: 06/01/2022 Target Resolution Date: 02/15/2023 Goal Status: Active Ulcer/skin breakdown will have [name] volume reduction of 30% by week 4 Date Initiated: 06/01/2022 Date Inactivated: 06/29/2022 Target Resolution Date: 06/29/2022 Unmet Reason: complex wounds, Goal Status: Unmet infection Interventions: Assess patient/caregiver ability to obtain necessary supplies Assess ulceration(s) every visit Provide education on ulcer and skin care Notes: 02/02/21: Complex Care, ongoing. Electronic Signature(s) Signed: 01/18/2023 2:56:12 PM By: Baruch Gouty RN, BSN Entered By: Baruch Gouty on 01/18/2023 08:29:55 -------------------------------------------------------------------------------- Pain Assessment Details Patient Name: Date of Service: [name], [name] 01/18/2023 8:15 [name] Medical Record Number: [id number] Patient Account Number: [id number] Date of Birth/Sex: Treating RN: 1986/05/30 (37 y.o. [name] Primary Care Andi Layfield: [place], [place] Other Clinician: Referring Grigor Lipschutz: Treating Yashica Sterbenz/Extender: Cornelia Copa [name]: 367 Active Problems Location of Pain Severity and Description of Pain Patient Has Paino No Site Locations Rate the pain. Neyer, Martice [name] ([id number]) [id number] Page 6 of 14 Rate the pain. Current Pain Level: 0 Pain Management and Medication Current Pain Management: Electronic Signature(s) Signed: 01/18/2023 2:56:12 PM By: Baruch Gouty RN, BSN Entered By: Baruch Gouty on 01/18/2023 08:28:52 -------------------------------------------------------------------------------- Patient/Caregiver Education Details Patient Name: Date of Service: [name], [name] 2/27/2024andnbsp8:15 [name] Medical Record Number: [id number] Patient Account Number: [id number] Date of Birth/Gender: Treating RN: [birth year] (37 y.o. [name] Primary Care Physician: Janine Limbo Other Clinician: Referring Physician: Treating Physician/Extender: Cornelia Copa [name]: 72 Education Assessment Education Provided To: Patient Education Topics Provided Pressure: Methods: Explain/Verbal Responses: Reinforcements needed, State content correctly Wound/Skin Impairment: Methods: Explain/Verbal Responses: Reinforcements needed, State content correctly Electronic Signature(s) Signed: 01/18/2023 2:56:12 PM By: Baruch Gouty RN, BSN Entered By: Baruch Gouty on 01/18/2023 08:30:26 -------------------------------------------------------------------------------- Wound Assessment Details Patient Name: Date of Service: Siverson, [name] 01/18/2023 8:15 [name] Medical Record Number: [id number] Patient Account Number: [id number] Date of Birth/Sex: Treating RN: Sep 12, 1986 (37 y.o. [name] Primary Care Hashem Goynes: [place], [place] Other Clinician: Referring Kylon Philbrook: Treating Gracyn Allor/Extender: Graciela Husbands, [name]: 367 Wound Status Wound Number: [id number] Primary Etiology: Venous Leg Ulcer Wound Location: Right, Dorsal Foot Wound Status: Open Doeden, Wendel [name] ([id number]) [id number] Page 7 of 14 Wounding Event: Gradually Appeared Comorbid History: Sleep Apnea, Hypertension, Paraplegia Date Acquired: 03/27/2021 Weeks Of Treatment: 94 Clustered Wound: No Wound Measurements Length: (cm) 2 Width: (cm) 5.1 Depth: (cm) 0.1 Area: (cm) 8.011 Volume: (cm) 0.801 % Reduction in Area: -325% % Reduction in Volume: -326.1% Epithelialization: Small (1-33%) Tunneling: No Undermining: No Wound Description Classification: Full Thickness Without Exposed Suppor Wound Margin: Distinct, outline attached Exudate Amount: Medium Exudate Type: Serosanguineous Exudate Color: red, brown t Structures Foul Odor After Cleansing: No Slough/Fibrino Yes Wound  Bed Granulation Amount: Small (1-33%) Exposed Structure Granulation Quality: Red Fascia Exposed: No Necrotic Amount: Large (67-100%) Fat Layer (Subcutaneous Tissue) Exposed: Yes Necrotic Quality: Adherent Slough Tendon Exposed: No Muscle Exposed: No Joint Exposed: No Bone Exposed: No Periwound Skin Texture Texture Color No Abnormalities Noted: Yes No Abnormalities Noted: Yes Moisture Temperature / Pain No Abnormalities Noted: No Temperature: No Abnormality Dry / Scaly: Yes Electronic Signature(s) Signed: 01/18/2023 2:56:12 PM By: Baruch Gouty RN, BSN Entered By: Baruch Gouty on 01/18/2023 08:47:18 -------------------------------------------------------------------------------- Wound Assessment Details Patient Name: Date of Service:  [name], [name] 01/18/2023 8:15 [name] Medical Record Number: [id number] Patient Account Number: [id number] Date of Birth/Sex: Treating RN: March 11, 1986 (37 y.o. [name] Primary Care Torria Fromer: [place], [place] Other Clinician: Referring Albie Bazin: Treating Johnella [name]/Extender: Graciela Husbands, [name]: 367 Wound Status Wound Number: 52 Primary Etiology: Venous Leg Ulcer Wound Location: Right, Dorsal Foot Wound Status: Open Wounding Event: Gradually Appeared Comorbid History: Sleep Apnea, Hypertension, Paraplegia Date Acquired: 03/27/2021 Weeks Of Treatment: 94 Clustered Wound: No Photos Murtha, Fishel [name] ([id number]) [id number] Page 8 of 14 Wound Measurements Length: (cm) 2 Width: (cm) 5.1 Depth: (cm) 0.1 Area: (cm) 8.011 Volume: (cm) 0.801 % Reduction in Area: -325% % Reduction in Volume: -326.1% Epithelialization: Small (1-33%) Tunneling: No Undermining: No Wound Description Classification: Full Thickness Without Exposed Su Wound Margin: Flat and Intact Exudate Amount: Medium Exudate Type: Serosanguineous Exudate Color: red, brown pport Structures Wound Bed Granulation  Amount: Small (1-33%) Exposed Structure Granulation Quality: Red Fascia Exposed: No Necrotic Amount: Large (67-100%) Fat Layer (Subcutaneous Tissue) Exposed: Yes Necrotic Quality: Adherent Slough Tendon Exposed: No Muscle Exposed: No Joint Exposed: No Bone Exposed: No Periwound Skin Texture Texture Color No Abnormalities Noted: Yes No Abnormalities Noted: No Erythema: Yes Moisture No Abnormalities Noted: No Temperature / Pain Dry / Scaly: Yes Temperature: No Abnormality Electronic Signature(s) Signed: 01/18/2023 2:56:12 PM By: Baruch Gouty RN, BSN Entered By: Baruch Gouty on 01/18/2023 08:49:44 -------------------------------------------------------------------------------- Wound Assessment Details Patient Name: Date of Service: [name], [name] 01/18/2023 8:15 [name] Medical Record Number: [id number] Patient Account Number: [id number] Date of Birth/Sex: Treating RN: 1986/09/29 (37 y.o. [name] Primary Care Fayez Sturgell: [place], [place] Other Clinician: Referring Montrell Cessna: Treating Zhara Gieske/Extender: Graciela Husbands, [name]: 367 Wound Status Wound Number: 56 Primary Etiology: Neuropathic Ulcer-Non Diabetic Wound Location: Left, Dorsal Foot Wound Status: Open Wounding Event: Gradually Appeared Comorbid History: Sleep Apnea, Hypertension, Paraplegia Date Acquired: 07/11/2021 Weeks Of Treatment: 78 Clustered Wound: Yes Photos Wound Measurements Length: (cm) 0.5 Width: (cm) 2.8 Depth: (cm) 0.1 [name], [name] ([id number]) Area: (cm) 1.1 Volume: (cm) 0.11 % Reduction in Area: 82.3% % Reduction in Volume: 82.3% Epithelialization: Large (67-100%) [id number] Page 9 of 14 Tunneling: No Undermining: No Wound Description Classification: Full Thickness Without Exposed Support Structures Wound Margin: Flat and Intact Exudate Amount: Small Exudate Type: Serous Exudate Color: amber Foul Odor After Cleansing:  No Slough/Fibrino Yes Wound Bed Granulation Amount: Small (1-33%) Exposed Structure Granulation Quality: Red Fascia Exposed: No Necrotic Amount: Small (1-33%) Fat Layer (Subcutaneous Tissue) Exposed: Yes Necrotic Quality: Adherent Slough Tendon Exposed: No Muscle Exposed: No Joint Exposed: No Bone Exposed: No Periwound Skin Texture Texture Color No Abnormalities Noted: Yes No Abnormalities Noted: No Erythema: No Moisture No Abnormalities Noted: No Temperature / Pain Dry / Scaly: Yes Temperature: No Abnormality Maceration: No Electronic Signature(s) Signed: 01/18/2023 2:56:12 PM By: Baruch Gouty RN, BSN Entered By: Baruch Gouty on 01/18/2023 08:50:53 -------------------------------------------------------------------------------- Wound Assessment Details Patient Name: Date of Service: [name], [name] 01/18/2023 8:15 [name] Medical Record Number: [id number] Patient Account Number: [id number] Date of Birth/Sex: Treating RN: 02-09-1986 (37 y.o. [name] Primary Care Tenesia Escudero: [place], [place] Other Clinician: Referring Demiya Magno: Treating Jalecia Leon/Extender: Graciela Husbands, [name]: 367 Wound Status Wound Number: 67 Primary Etiology: Venous Leg Ulcer Wound Location: Left, Lateral Lower Leg Wound Status: Open Wounding Event: Gradually Appeared Comorbid History: Sleep Apnea, Hypertension, Paraplegia Date Acquired: 06/22/2022 Weeks Of Treatment: 29 Clustered Wound: No Photos Wound Measurements Length: (cm) 0.9  Width: (cm) 0.5 Depth: (cm) 0.1 Area: (cm) 0.353 Volume: (cm) 0.035 [name], Nosson [name] ([id number]) Wound Description Classification: Full Thickness Without Exposed Suppor Wound Margin: Distinct, outline attached Exudate Amount: Medium Exudate Type: Serosanguineous Exudate Color: red, brown t Structures Foul Odor After Cleansing: Slough/Fibrino % Reduction in Area: 93.7% % Reduction in Volume: 93.8% Epithelialization: Medium  (34-66%) Tunneling: No Undermining: No [id number] Page 10 of 14 No No Wound Bed Granulation Amount: Large (67-100%) Exposed Structure Granulation Quality: Red Fascia Exposed: No Necrotic Amount: Small (1-33%) Fat Layer (Subcutaneous Tissue) Exposed: Yes Necrotic Quality: Adherent Slough Tendon Exposed: No Muscle Exposed: No Joint Exposed: No Bone Exposed: No Periwound Skin Texture Texture Color No Abnormalities Noted: Yes No Abnormalities Noted: No Erythema: Yes Moisture Erythema Location: Red Streaks No Abnormalities Noted: No Erythema Change: No Change Dry / Scaly: Yes Temperature / Pain Temperature: No Abnormality Electronic Signature(s) Signed: 01/18/2023 2:56:12 PM By: Baruch Gouty RN, BSN Entered By: Baruch Gouty on 01/18/2023 08:53:00 -------------------------------------------------------------------------------- Wound Assessment Details Patient Name: Date of Service: Nerio, [name] 01/18/2023 8:15 [name] Medical Record Number: [id number] Patient Account Number: [id number] Date of Birth/Sex: Treating RN: Jan 19, 1986 (37 y.o. [name] Primary Care Cherika Jessie: [place], [place] Other Clinician: Referring Sonya Gunnoe: Treating Jyden Kromer/Extender: Graciela Husbands, [name]: 367 Wound Status Wound Number: [id number] Primary Etiology: Venous Leg Ulcer Wound Location: Right, Lateral Lower Leg Wound Status: Healed - Epithelialized Wounding Event: Gradually Appeared Comorbid History: Sleep Apnea, Hypertension, Paraplegia Date Acquired: 12/06/2022 Weeks Of Treatment: 4 Clustered Wound: No Photos Wound Measurements Length: (cm) Width: (cm) Depth: (cm) Area: (cm) Volume: (cm) 0 % Reduction in Area: 100% 0 % Reduction in Volume: 100% 0 Epithelialization: Large (67-100%) 0 Tunneling: No 0 Undermining: No Wound Description Classification: Full Thickness Without Exposed Support Structures Lentz, Madex [name]  ([id number]) Exudate Amount: None Present Foul Odor After Cleansing: No [id number] Page 11 of 14 Slough/Fibrino No Wound Bed Granulation Amount: None Present (0%) Exposed Structure Necrotic Amount: None Present (0%) Fascia Exposed: No Fat Layer (Subcutaneous Tissue) Exposed: No Tendon Exposed: No Muscle Exposed: No Joint Exposed: No Bone Exposed: No Periwound Skin Texture Texture Color No Abnormalities Noted: Yes No Abnormalities Noted: Yes Moisture Temperature / Pain No Abnormalities Noted: Yes Temperature: No Abnormality Electronic Signature(s) Signed: 01/18/2023 2:56:12 PM By: Baruch Gouty RN, BSN Entered By: Baruch Gouty on 01/18/2023 08:54:00 -------------------------------------------------------------------------------- Wound Assessment Details Patient Name: Date of Service: Lites, [name] 01/18/2023 8:15 [name] Medical Record Number: [id number] Patient Account Number: [id number] Date of Birth/Sex: Treating RN: 05/29/1986 (37 y.o. [name] Primary Care Vale Peraza: [place], [place] Other Clinician: Referring Adrinne Sze: Treating Louiza Moor/Extender: Graciela Husbands, [name]: 367 Wound Status Wound Number: [id number] Primary Etiology: Pressure Ulcer Wound Location: Right Gluteus Wound Status: Healed - Epithelialized Wounding Event: Pressure Injury Comorbid History: Sleep Apnea, Hypertension, Paraplegia Date Acquired: 12/13/2022 Weeks Of Treatment: 4 Clustered Wound: No Photos Wound Measurements Length: (cm) Width: (cm) Depth: (cm) Area: (cm) Volume: (cm) 0 % Reduction in Area: 100% 0 % Reduction in Volume: 100% 0 Epithelialization: Large (67-100%) 0 Tunneling: No 0 Undermining: No Wound Description Classification: Category/Stage III Exudate Amount: None Present Foul Odor After Cleansing: No Slough/Fibrino No Wound Bed Granulation Amount: None Present (0%) Exposed Structure Necrotic Amount: None Present  (0%) Fascia Exposed: No Fat Layer (Subcutaneous Tissue) Exposed: No Mccorkle, Nell [name] ([id number]) [id number] Page 12 of 14 Tendon Exposed: No Muscle Exposed: No Joint Exposed: No Bone Exposed: No Periwound Skin Texture Texture Color No  Abnormalities Noted: Yes No Abnormalities Noted: Yes Moisture Temperature / Pain No Abnormalities Noted: Yes Temperature: No Abnormality Electronic Signature(s) Signed: 01/18/2023 2:56:12 PM By: Baruch Gouty RN, BSN Entered By: Baruch Gouty on 01/18/2023 09:00:07 -------------------------------------------------------------------------------- Wound Assessment Details Patient Name: Date of Service: Rensch, [name] 01/18/2023 8:15 [name] Medical Record Number: [id number] Patient Account Number: [id number] Date of Birth/Sex: Treating RN: September 19, 1986 (37 y.o. [name] Primary Care Siah Steely: [place], [place] Other Clinician: Referring Masin Shatto: Treating Chauncy Mangiaracina/Extender: Graciela Husbands, [name]: 367 Wound Status Wound Number: [id number] Primary Etiology: Pressure Ulcer Wound Location: Left, Proximal, Lateral Lower Leg Wound Status: Open Wounding Event: Pressure Injury Comorbid History: Sleep Apnea, Hypertension, Paraplegia Date Acquired: 12/21/2022 Weeks Of Treatment: 2 Clustered Wound: No Photos Wound Measurements Length: (cm) 1.8 Width: (cm) 1.3 Depth: (cm) 0.1 Area: (cm) 1.838 Volume: (cm) 0.184 % Reduction in Area: 56.7% % Reduction in Volume: 56.6% Epithelialization: Small (1-33%) Tunneling: No Undermining: No Wound Description Classification: Category/Stage III Wound Margin: Distinct, outline attached Exudate Amount: Medium Exudate Type: Serosanguineous Exudate Color: red, brown Foul Odor After Cleansing: No Slough/Fibrino Yes Wound Bed Granulation Amount: Medium (34-66%) Exposed Structure Granulation Quality: Pink Fascia Exposed: No Necrotic Amount: Medium (34-66%) Fat  Layer (Subcutaneous Tissue) Exposed: Yes Necrotic Quality: Adherent Slough Tendon Exposed: No Muscle Exposed: No Joint Exposed: No Bone Exposed: No Loden, Aleksei [name] ([id number]) [id number] Page 13 of 14 Periwound Skin Texture Texture Color No Abnormalities Noted: Yes No Abnormalities Noted: No Erythema: Yes Moisture Erythema Measurement: Measured No Abnormalities Noted: Yes 2.5 cm Temperature / Pain Temperature: No Abnormality Electronic Signature(s) Signed: 01/18/2023 2:56:12 PM By: Baruch Gouty RN, BSN Entered By: Baruch Gouty on 01/18/2023 08:55:08 -------------------------------------------------------------------------------- Wound Assessment Details Patient Name: Date of Service: Garside, [name] 01/18/2023 8:15 [name] Medical Record Number: [id number] Patient Account Number: [id number] Date of Birth/Sex: Treating RN: Nov 09, 1986 (37 y.o. [name] Primary Care Korinna Tat: [place], [place] Other Clinician: Referring Jomarie Gellis: Treating Leonna Schlee/Extender: Graciela Husbands, [name]: 367 Wound Status Wound Number: 74 Primary Etiology: Pressure Ulcer Wound Location: Left, Posterior Upper Leg Wound Status: Open Wounding Event: Pressure Injury Comorbid History: Sleep Apnea, Hypertension, Paraplegia Date Acquired: 01/10/2023 Weeks Of Treatment: 0 Clustered Wound: No Photos Wound Measurements Length: (cm) 10 Width: (cm) 9.5 Depth: (cm) 0.1 Area: (cm) 74.613 Volume: (cm) 7.461 % Reduction in Area: % Reduction in Volume: Epithelialization: Small (1-33%) Tunneling: No Undermining: No Wound Description Classification: Category/Stage III Wound Margin: Flat and Intact Exudate Amount: Large Exudate Type: Serosanguineous Exudate Color: red, brown Foul Odor After Cleansing: No Slough/Fibrino Yes Wound Bed Granulation Amount: Medium (34-66%) Exposed Structure Granulation Quality: Red Fascia Exposed: No Necrotic  Amount: Medium (34-66%) Fat Layer (Subcutaneous Tissue) Exposed: Yes Necrotic Quality: Adherent Slough Tendon Exposed: No Muscle Exposed: No Joint Exposed: No Bone Exposed: No Periwound Skin Texture Texture Color Krausz, Marsel [name] ([id number] Page 14 of 14 No Abnormalities Noted: No No Abnormalities Noted: Yes Scarring: Yes Temperature / Pain Temperature: No Abnormality Moisture No Abnormalities Noted: Yes Electronic Signature(s) Signed: 01/18/2023 2:56:12 PM By: Baruch Gouty RN, BSN Entered By: Baruch Gouty on 01/18/2023 09:10:22 -------------------------------------------------------------------------------- Vitals Details Patient Name: Date of Service: Meras, [name] 01/18/2023 8:15 [name] Medical Record Number: [id number] Patient Account Number: [id number] Date of Birth/Sex: Treating RN: 08-Jan-1986 (37 y.o. [name] Primary Care Feliz Lincoln: [place], [place] Other Clinician: Referring Lucian Baswell: Treating Neesha Langton/Extender: Graciela Husbands, [name]: 367 Vital Signs Time Taken: 09:13 Temperature (F): 98.2  Height (in): 70 Pulse (bpm): 105 Weight (lbs): 216 Respiratory Rate (breaths/min): 18 Body Mass Index (BMI): 31 Blood Pressure (mmHg): 126/79 Reference Range: 80 - 120 mg / dl Electronic Signature(s) Signed: 01/18/2023 2:56:12 PM By: Baruch Gouty RN, BSN Entered By: Baruch Gouty on 01/18/2023 09:13:29

## 2023-01-19 NOTE — Progress Notes (Signed)
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 erythema is still present but the warmth and swelling is improved. I am hopeful that the cellulitis part of this control. I wouldn't be surprised if there is an element of venous inflammation as well. 01/17/17. The erythema is present but better in the left leg. His left lateral leg wound still does not have [name] viable surface buttons certain parts of this long thin wound it appears like there has been improvement in dimensions. 01/20/17; the erythema still present but much better in the left leg. I'm thinking this is his usual degree of chronic venous inflammation. The wound on the left leg looks somewhat better. Is less surface slough 01/27/17; erythema is back to the chronic venous inflammation. The wound on the left leg is somewhat better. I am back to the point where I like to try an Apligraf once again 02/10/17; slight improvement in wound dimensions. Apligraf #2. He is completing his doxycycline 02/14/17; patient arrives today having completed doxycycline last Thursday. This was supposed to be [name] nurse visit however once again he hasn't tense erythema from the medial part of his wound extending over the lower leg. Also erythema in his foot this is roughly in the same distribution as last time. He has baseline chronic venous inflammation however this is [name] lot worse than the baseline I have learned to accept the on him is baseline  inflammation 02/24/17- patient is here for follow-up evaluation. He is tolerating compression therapy. His voicing no complaints or concerns he is here anticipating an Apligraf 03/03/17; he arrives today with an adherent necrotic surface. I don't think this is surface is going to be amenable for Apligraf's. The erythema around his wound and on the left dorsal foot has resolved he is off antibiotics 03/10/17; better-looking surface today. I don't think he can tolerate Apligraf's. He tells me he had [name] wound VAC after [name] skin graft years ago to this area and they had difficulty with [name] seal. The erythema continues to be stable around this some degree of chronic venous inflammation but he also has recurrent cellulitis. We have been using Iodoflex 03/17/17; continued improvement in the surface and may be small changes in dimensions. Using Iodoflex which seems the only thing that will control his surface 03/24/17- He is here for follow up evaluation of his LLE lateral ulceration and ulcer to right dorsal foot/toe space. He is voicing no complaints or concerns, He is tolerating compression wrap. 03/31/17 arrives today with [name] much healthier looking wound on the left lower extremity. We have been using Iodoflex for [name] prolonged  period of time which has for the first time prepared and adequate looking wound bed although we have not had much in the way of wound dimension improvement. He also has [name] small wound between the first and second toe on the right 04/07/17; arrives today with [name] healthy-looking wound bed and at least the top 50% of this wound appears to be now her. No debridement was required I have changed him to [facility] last week after prolonged Iodoflex. He did not do well with Apligraf's. We've had [name] re-opening between the first and second toe on the right 04/14/17; arrives today with [name] healthier looking wound bed contractions and the top 50% of this wound and some on the lesser 50%. Wound bed appears  healthy. The area between the first and second toe on the right still remains problematic 04/21/17; continued very gradual improvement. Using [facility] 04/28/17; continued very gradual improvement in the left lateral leg venous insufficiency wound. His periwound erythema is very mild. We have been using Hydrofera Blue. Wound is making progress especially in the superior 50% 05/05/17; he continues to have very gradual improvement in the left lateral venous insufficiency wound. Both in terms with an length rings are improving. I debrided this every 2 weeks with #5 curet and we have been using Hydrofera Blue and again making good progress With regards to the wounds between his right first and second toe which I thought might of been tinea pedis he is not making as much progress very dry scaly skin over the area. Also the area at the base of the left first and second toe in [name] similar condition 05/12/17; continued gradual improvement in the refractory left lateral venous insufficiency wound on the left. Dimension smaller. Surface still requiring debridement using Hydrofera Blue Bice, Rajan [name] ([id number]) [phone].pdf Page 6 of 37 05/19/17; continued gradual improvement in the refractory left lateral venous ulceration. Careful inspection of the wound bed underlying rumination suggested some degree of epithelialization over the surface no debridement indicated. Continue Hydrofera Blue difficult areas between his toes first and third on the left than first and second on the right. I'm going to change to silver alginate from silver collagen. Continue ketoconazole as I suspect underlying tinea pedis 05/26/17; left lateral leg venous insufficiency wound. We've been using Hydrofera Blue. I believe that there is expanding epithelialization over the surface of the wound albeit not coming from the wound circumference. This is [name] bit of an odd situation in which the epithelialization seems to  be coming from the surface of the wound rather than in the exact circumference. There is still small open areas mostly along the lateral margin of the wound. He has unchanged areas between the left first and second and the right first second toes which I been treating for tenia pedis 06/02/17; left lateral leg venous insufficiency wound. We have been using Hydrofera Blue. Somewhat smaller from the wound circumference. The surface of the wound remains [name] bit on it almost epithelialized sedation in appearance. I use an open curette today debridement in the surface of all of this especially the edges Small open wounds remaining on the dorsal right first and second toe interspace and the plantar left first second toe and her face on the left 06/09/17; wound on the left lateral leg continues to be smaller but very gradual and very dry surface using Hydrofera Blue 06/16/17 requires weekly debridements now on the left lateral leg although this continues to contract. I changed to silver collagen last  week because of dryness of the wound bed. Using Iodoflex to the areas on his first and second toes/web space bilaterally 06/24/17; patient with history of paraplegia also chronic venous insufficiency with lymphedema. Has [name] very difficult wound on the left lateral leg. This has been gradually reducing in terms of with but comes in with [name] very dry adherent surface. High switch to silver collagen [name] week or so ago with hydrogel to keep the area moist. This is been refractory to multiple dressing attempts. He also has areas in his first and second toes bilaterally in the anterior and posterior web space. I had been using Iodoflex here after [name] prolonged course of silver alginate with ketoconazole was ineffective [question tinea pedis] 07/14/17; patient arrives today with [name] very difficult adherent material over his left lateral lower leg wound. He also has surrounding erythema and poorly controlled edema. He was switched his  Santyl last visit which the nurses are applying once during his doctor visit and once on [name] nurse visit. He was also reduced to 2 layer compression I'm not exactly sure of the issue here. 07/21/17; better surface today after 1 week of Iodoflex. Significant cellulitis that we treated last week also better. [Doxycycline] 07/28/17 better surface today with now 2 weeks of Iodoflex. Significant cellulitis treated with doxycycline. He has now completed the doxycycline and he is back to his usual degree of chronic venous inflammation/stasis dermatitis. He reminds me he has had ablations surgery here 08/04/17; continued improvement with Iodoflex to the left lateral leg wound in terms of the surface of the wound although the dimensions are better. 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 Problematic areas on the plantar aspect of the first second toe web space on the left and the dorsal aspect of the first second toe web space on the right. At one point I felt these were probably related to chronic fungal infections in treated him aggressively for this although we have not made any improvement here. 08/11/17; left lateral leg. Surface continues to improve with the Iodoflex although we are not seeing much improvement in overall wound dimensions. Areas on his plantar left foot and right foot show no improvement. In fact the right foot looks somewhat worse 08/18/17; left lateral leg. We changed to [facility] Blue last week after [name] prolonged course of Iodoflex which helps get the surface better. It appears that the wound with is improved. Continue with difficult areas on the left dorsal first second and plantar first second on the right 09/01/17; patient arrives in clinic today having had [name] temperature of 103 yesterday. He was seen in the ER and [facility]. The patient was concerned he could have cellulitis again in the right leg however they diagnosed  him with [name] UTI and he is now on Keflex. He has [name] history of cellulitis which is been recurrent and difficult but this is been in the left leg, in the past 5 use doxycycline. He does in and out catheterizations at home which are risk factors for UTI 09/08/17; patient will be completing his Keflex this weekend. The erythema on the left leg is considerably better. He has [name] new wound today on the medial part of the right leg small superficial almost looks like [name] skin tear. He has worsening of the area on the right dorsal first and second toe. His major area on the left lateral leg is better. Using Hydrofera Blue on all areas 09/15/17; gradual  reduction in width on the long wound in the left lateral leg. No debridement required. He also has wounds on the plantar aspect of his left first second toe web space and on the dorsal aspect of the right first second toe web space. 09/22/17; there continues to be very gradual improvements in the dimensions of the left lateral leg wound. He hasn't round erythematous spot with might be pressure on his wheelchair. There is no evidence obviously of infection no purulence no warmth He has [name] dry scaled area on the plantar aspect of the left first second toe Improved area on the dorsal right first second toe. 09/29/17; left lateral leg wound continues to improve in dimensions mostly with an is still [name] fairly long but increasingly narrow wound. He has [name] dry scaled area on the plantar aspect of his left first second toe web space Increasingly concerning area on the dorsal right first second toe. In fact I am concerned today about possible cellulitis around this wound. The areas extending up his second toe and although there is deformities here almost appears to abut on the nailbed. 10/06/17; left lateral leg wound continues to make very gradual progress. Tissue culture I did from the right first second toe dorsal foot last time grew MRSA and enterococcus which was vancomycin  sensitive. This was not sensitive to clindamycin or doxycycline. He is allergic to Zyvox and sulfa we have therefore arrange for him to have dalvance infusion tomorrow. He is had this in the past and tolerated it well 10/20/17; left lateral leg wound continues to make decent progress. This is certainly reduced in terms of with there is advancing epithelialization.The cellulitis in the right foot looks better although he still has [name] deep wound in the dorsal aspect of the first second toe web space. Plantar left first toe web space on the left I think is making some progress 10/27/17; left lateral leg wound continues to make decent progress. Advancing epithelialization.using Hydrofera Blue The right first second toe web space wound is better-looking using silver alginate Improvement in the left plantar first second toe web space. Again using silver alginate 11/03/17 left lateral leg wound continues to make decent progress albeit slowly. Using [facility] The right per second toe web space continues to be [name] very problematic looking punched out wound. I obtained [name] piece of tissue for deep culture I did extensively treated this for fungus. It is difficult to imagine that this is [name] pressure area as the patient states other than going outside he doesn't really wear shoes at home The left plantar first second toe web space looked fairly senescent. Necrotic edges. This required debridement change to [facility] Blue to all wound areas 11/10/17; left lateral leg wound continues to contract. Using Hydrofera Blue On the right dorsal first second toe web space dorsally. Culture I did of this area last week grew MRSA there is not an easy oral option in this patient was multiple antibiotic allergies or intolerances. This was only [name] rare culture isolate I'm therefore going to use Bactroban under silver alginate On the left plantar first second toe web space. Debridement is required here. This is also  unchanged 11/17/17; left lateral leg wound continues to contract using Hydrofera Blue this is no longer the major issue. The major concern here is the right first second toe web space. He now has an open area going from dorsally to the plantar aspect. There is now wound on the inner lateral part of the first toe.  Not [name] very viable surface on this. There is erythema spreading medially into the forefoot. No major change in the left first second toe plantar wound 11/24/17; left lateral leg wound continues to contract using Hydrofera Blue. Nice improvement today The right first second toe web space all of this looks [name] lot less angry than last week. I have given him clindamycin and topical Bactroban for MRSA and terbinafine for the possibility of underlining tinea pedis that I could not control with ketoconazole. Looks somewhat better The area on the plantar left first second toe web space is weeping with dried debris around the wound 12/01/17; left lateral leg wound continues to contract he Hydrofera Blue. It is becoming thinner in terms of with nevertheless it is making good improvement. The right first second toe web space looks less angry but still [name] large necrotic-looking wounds starting on the plantar aspect of the right foot extending between the toes and now extensively on the base of the right second toe. I gave him clindamycin and topical Bactroban for MRSA anterior benefiting for the possibility of underlying tinea pedis. Not looking better today The area on the left first/second toe looks better. Debrided of necrotic debris 12/05/17* the patient was worked in urgently today because over the weekend he found blood on his incontinence bad when he woke up. He was found to have an ulcer by his wife who does most of his wound care. He came in today for [place] to look at this. He has not had [name] history of wounds in his buttocks in spite of his paraplegia. 12/08/17; seen in follow-up today at his usual  appointment. He was seen earlier this week and found to have [name] new wound on his buttock. We also follow him for wounds on the left lateral leg, left first second toe web space and right first second toe web space 12/15/17; we have been using Hydrofera Blue to the left lateral leg which has improved. The right first second toe web space has also improved. Left first second toe web space plantar aspect looks stable. The left buttock has worsened using Santyl. Apparently the buttock has drainage 12/22/17; we have been using Hydrofera Blue to the left lateral leg which continues to improve now 2 small wounds separated by normal skin. He tells [place] he had [name] fever up to 100 yesterday he is prone to UTIs but has not noted anything different. He does in and out catheterizations. The area between the first and second toes today does not look good necrotic surface covered with what looks to be purulent drainage and erythema extending into the third toe. [name] [name], [name] [name] ([id number]) [id number] Page 7 of 37 I had gotten this to something that I thought look better last time however it is not look good today. He also has [name] necrotic surface over the buttock wound which is expanded. I thought there might be infection under here so I removed [name] lot of the surface with [name] #5 curet though nothing look like it really needed culturing. He is been using Santyl to this area 12/27/17; his original wound on the left lateral leg continues to improve using Hydrofera Blue. I gave him samples of Baxdella although he was unable to take them out of fear for an allergic reaction ["lump in his throat"].the culture I did of the purulent drainage from his second toe last week showed both enterococcus and [name] set Enterobacter I was also concerned about the erythema on the bottom of his  foot although paradoxically although this looks somewhat better today. Finally his pressure ulcer on the left buttock looks worse this is  clearly now [name] stage III wound necrotic surface requiring debridement. We've been using silver alginate here. They came up today that he sleeps in [name] recliner, I'm not sure why but I asked him to stop this 01/03/18; his original wound we've been using Hydrofera Blue is now separated into 2 areas. Ulcer on his left buttock is better he is off the recliner and sleeping in bed Finally both wound areas between his first and second toes also looks some better 01/10/18; his original wound on the left lateral leg is now separated into 2 wounds we've been using Hydrofera Blue Ulcer on his left buttock has some drainage. There is [name] small probing site going into muscle layer superiorly.using silver alginate -He arrives today with [name] deep tissue injury on the left heel The wound on the dorsal aspect of his first second toe on the left looks [name] lot betterusing silver alginate ketoconazole The area on the first second toe web space on the right also looks [name] lot bette 01/17/18; his original wound on the left lateral leg continues to progress using Hydrofera Blue Ulcer on his left buttock also is smaller surface healthier except for [name] small probing site going into the muscle layer superiorly. 2.4 cm of tunneling in this area DTI on his left heel we have only been offloading. Looks better than last week no threatened open no evidence of infection the wound on the dorsal aspect of the first second toe on the left continues to look like it's regressing we have only been using silver alginate and terbinafine orally The area in the first second toe web space on the right also looks to be [name] lot better using silver alginate and terbinafine I think this was prompted by tinea pedis 01/31/18; the patient was hospitalized in [place] last week apparently for [name] complicated UTI. He was discharged on cefepime he does in and out catheterizations. In the hospital he was discovered M I don't mild elevation of AST and ALT and the  terbinafine was stopped.predictably the pressure ulcer on s his buttock looks betterusing silver alginate. The area on the left lateral leg also is better using Hydrofera Blue. The area between the first and second toes on the left better. First and second toes on the right still substantial but better. Finally the DTI on the left heel has held together and looks like it's resolving 02/07/18-he is here in follow-up evaluation for multiple ulcerations. He has new injury to the lateral aspect of the last issue [name] pressure ulcer, he states this is from adhesive removal trauma. He states he has tried multiple adhesive products with no success. All other ulcers appear stable. The left heel DTI is resolving. We will continue with same treatment plan and follow-up next week. 02/14/18; follow-up for multiple areas. He has [name] new area last week on the lateral aspect of his pressure ulcer more over the posterior trochanter. The original pressure ulcer looks quite stable has healthy granulation. 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 Almost fully epithelialized on the original superior area using Hydrofera Blue DTI on the left heel has peeled off this week to reveal [name] small superficial wound under denuded skin and subcutaneous tissue Both areas between the first and second toes look better including nothing open  on the left 02/21/18; The patient's wounds on his left ischial tuberosity and posterior left greater trochanter actually looked better. He has [name] large area of irritation around the area which I think is contact dermatitis. I am doubtful that this is fungal His original wound on the left lateral calf continues to improve we have been using Hydrofera Blue There is no open area in the left first second toe web space although there is [name] lot of thick callus The DTI on the left heel required debridement  today of necrotic surface eschar and subcutaneous tissue using silver alginate Finally the area on the right first second toe webspace continues to contract using silver alginate and ketoconazole 02/28/18 Left ischial tuberosity wounds look better using silver alginate. Original wound on the left calf only has one small open area left using Hydrofera Blue DTI on the left heel required debridement mostly removing skin from around this wound surface. Using silver alginate The areas on the right first/second toe web space using silver alginate and ketoconazole 03/08/18 on evaluation today patient appears to be doing decently well as best I can tell in regard to his wounds. This is the first time that I have seen him as he generally is followed by Dr. Dellia Nims. With that being said none of his wounds appear to be infected he does have an area where there is some skin covering what appears to be [name] new wound on the left dorsal surface of his great toe. This is right at the nail bed. With that being said I do believe that debrided away some of the excess skin can be of benefit in this regard. Otherwise he has been tolerating the dressing changes without complication. 03/14/18; patient arrives today with the multiplicity of wounds that we are following. He has not been systemically unwell Original wound on the left lateral calf now only has 2 small open areas we've been using Hydrofera Blue which should continue The deep tissue injury on the left heel requires debridement today. We've been using silver alginate The left first second toe and the right first second toe are both are reminiscence what I think was tinea pedis. Apparently some of the callus Surface between the toes was removed last week when it started draining. Purulent drainage coming from the wound on the ischial tuberosity on the left. 03/21/18-He is here in follow-up evaluation for multiple wounds. 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 He tolerated debridement. The only change to last week's recommendations is to discontinue antifungal cream between toes. He will follow-up next week 03/28/18; following up for multiple wounds;Concern this week is streaking redness and swelling in the right foot. He is going to need antibiotics for this. 03/31/18; follow-up for right foot cellulitis. Streaking redness and swelling in the right foot on 03/28/18. 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 I put him on clindamycin 300 mg every 6 and brought him in for [name] quick check. He has an open wound between his first and second toes on the right foot as [name] potential source. 04/04/18; Right foot cellulitis is resolving he is completing clindamycin. This is truly good news Left lateral calf wound which is initial wound only has one small open area inferiorly this is close to healing out. He has compression stockings. We will use Hydrofera Blue right down to the epithelialization of this Nonviable surface on the left heel which was initially pressure with  [name] DTI. We've been using Hydrofera Blue. I'm going to switch this back to silver alginate Left first second toe/tinea pedis this looks better using silver alginate Right first second toe tinea pedis using silver alginate Large pressure ulcers on theLeft ischial tuberosity. Small wound here Looks better. I am uncertain about the surface over the large wound. Using silver alginate 04/11/18; Cellulitis in the right foot is resolved Left lateral calf wound which was his original wounds still has 2 tiny open areas remaining this is just about closed Nonviable surface on the left heel is better but still requires debridement Left first second toe/tinea pedis still open using silver alginate Right first second toe wound tinea pedis I asked him to go back to using ketoconazole and silver alginate Large pressure ulcers  on the left ischial tuberosity this shear injury here is resolved. Wound is smaller. No evidence of infection using silver alginate 04/18/18; Patient arrives with an intense area of cellulitis in the right mid lower calf extending into the right heel area. Bright red and warm. Smaller area on the left anterior leg. He has [name] significant history of MRSA. He will definitely need antibioticsdoxycycline [name] [name], [name] [name] ([id number]) [id number] Page 8 of 37 He now has 2 open areas on the left ischial tuberosity the original large wound and now [name] satellite area which I think was above his initial satellite areas. Not [name] wonderful surface on this satellite area surrounding erythema which looks like pressure related. His left lateral calf wound again his original wound is just about closed Left heel pressure injury still requiring debridement Left first second toe looks [name] lot better using silver alginate Right first second toe also using silver alginate and ketoconazole cream also looks better 04/20/18; the patient was worked in early today out of concerns with his cellulitis on the right leg. I had started him on doxycycline. This was 2 days ago. His wife was concerned about the swelling in the area. Also concerned about the left buttock. He has not been systemically unwell no fever chills. No nausea vomiting or diarrhea 04/25/18; the patient's left buttock wound is continued to deteriorate he is using Hydrofera Blue. He is still completing clindamycin for the cellulitis on the right leg although all of this looks better. 05/02/18 Left buttock wound still with [name] lot of drainage and [name] very tightly adherent fibrinous necrotic surface. He has [name] deeper area superiorly The left lateral calf wound is still closed DTI wound on the left heel necrotic surface especially the circumference using Iodoflex Areas between his left first second toe and right first second toe both look better. Dorsally  and the right first second toe he had [name] necrotic surface although at smaller. In using silver alginate and ketoconazole. I did [name] culture last week which was [name] deep tissue culture of the reminiscence of the open wound on the right first second toe dorsally. This grew [name] few Acinetobacter and [name] few methicillin-resistant staph aureus. Nevertheless the area actually this week looked better. I didn't feel the need to specifically address this at least in terms of systemic antibiotics. 05/09/18; wounds are measuring larger more drainage per our intake. We are using Santyl covered with alginate on the large superficial buttock wounds, Iodosorb on the left heel, ketoconazole and silver alginate to the dorsal first and second toes bilaterally. 05/16/18; The area on his left buttock better in some aspects although the area superiorly over the ischial tuberosity required an extensive debridement.using Santyl Left heel appears  stable. Using Iodoflex The areas between his first and second toes are not bad however there is spreading erythema up the dorsal aspect of his left foot this looks like cellulitis again. He is insensate the erythema is really very brilliant.o Erysipelas He went to see an allergist days ago because he was itching part of this he had lab work done. This showed [name] white count of 15.1 with 70% neutrophils. Hemoglobin of 11.4 and [name] platelet count of 659,000. Last white count we had in Epic was [name] 2-1/2 years ago which was 25.9 but he was ill at the time. He was able to show me some lab work that was done by his primary physician the pattern is about the same. 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 But prompted me to go ahead and do x-rays of both feet and the pelvis rule out osteomyelitis. He also had [name] comprehensive metabolic panel this was reasonably normal his albumin was 3.7 liver function tests BUN/creatinine all normal 05/23/18; x-rays of both  his feet from last week were negative for underlying pulmonary abnormality. The x-ray of his pelvis however showed mild irregularity in the left ischial which may represent some early osteomyelitis. The wound in the left ischial continues to get deeper clearly now exposed muscle. Each week necrotic surface material over this area. Whereas the rest of the wounds do not look so bad. The left ischial wound we have been using Santyl and calcium alginate T the left heel surface necrotic debris using Iodoflex o The left lateral leg is still healed Areas on the left dorsal foot and the right dorsal foot are about the same. There is some inflammation on the left which might represent contact dermatitis, fungal dermatitis I am doubtful cellulitis although this looks better than last week 05/30/18; CT scan done at Hospital did not show any osteomyelitis or abscess. Suggested the possibility of underlying cellulitis although I don't see [name] lot of evidence of this at the bedside The wound itself on the left buttock/upper thigh actually looks somewhat better. No debridement Left heel also looks better no debridement continue Iodoflex Both dorsal first second toe spaces appear better using Lotrisone. Left still required debridement 06/06/18; Intake reported some purulent looking drainage from the left gluteal wound. Using Santyl and calcium alginate Left heel looks better although still [name] nonviable surface requiring debridement The left dorsal foot first/second webspace actually expanding and somewhat deeper. I may consider doing [name] shave biopsy of this area Right dorsal foot first/second webspace appears stable to improved. Using Lotrisone and silver alginate to both these areas 06/13/18 Left gluteal surface looks better. Now separated in the 2 wounds. No debridement required. Still drainage. We'll continue silver alginate Left heel continues to look better with Iodoflex continue this for at least another week Of  his dorsal foot wounds the area on the left still has some depth although it looks better than last week. We've been using Lotrisone and silver alginate 06/20/18 Left gluteal continues to look better healthy tissue Left heel continues to look better healthy granulation wound is smaller. He is using Iodoflex and his long as this continues continue the Iodoflex Dorsal right foot looks better unfortunately dorsal left foot does not. There is swelling and erythema of his forefoot. He had minor trauma to this several days ago but doesn't think this was enough to have caused any tissue injury. Foot looks like cellulitis, we have had this problem  before 06/27/18 on evaluation today patient appears to be doing [name] little worse in regard to his foot ulcer. 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 Both of which would really be his only options for treating this infection. In the past he has been given and effusion of Orbactiv. This is done very well for him in the past again it's one time dosing IV antibiotic therapy. Subsequently I do believe this is something we're gonna need to see about doing at this point in time. Currently his other wounds seem to be doing somewhat better in my pinion I'm pretty happy in that regard. 07/03/18 on evaluation today patient's wounds actually appear to be doing fairly well. He has been tolerating the dressing changes without complication. All in all he seems to be showing signs of improvement. In regard to the antibiotics he has been dealing with infectious disease since I saw him last week as far as getting this scheduled. In the end [facility]'s [facility] this coming Friday. In the meantime he has been continuing to perform the dressing changes in such as previous. There does not appear to  be any evidence of infection worsengin at this time. 07/10/18; Since I last saw this man 2 weeks ago things have actually improved. IV antibiotics of resulted in less forefoot erythema although there is still some present. He is not systemically unwell Left buttock wounds 2 now have no depth there is increased epithelialization Using silver alginate Left heel still requires debridement using Iodoflex Left dorsal foot still with [name] sizable wound about the size of [name] border but healthy granulation Right dorsal foot still with [name] slitlike area using silver alginate 07/18/18; the patient's cellulitis in the left foot is improved in fact I think it is on its way to resolving. Left buttock wounds 2 both look better although the larger one has hypertension granulation we've been using silver alginate Left heel has some thick circumferential redundant skin over the wound edge which will need to be removed today we've been using Iodoflex Left dorsal foot is still [name] sizable wound required debridement using silver alginate The right dorsal foot is just about closed only [name] small open area remains here 07/25/18; left foot cellulitis is resolved Left buttock wounds 2 both look better. Hyper-granulation on the major area Left heel as some debris over the surface but otherwise looks [name] healthier wound. Using silver collagen Right dorsal foot is just about closed 07/31/18; arrives with our intake nurse worried about purulent drainage from the buttock. We had hyper-granulation here last week His buttock wounds 2 continue to look better Left heel some debris over the surface but measuring smaller. Right dorsal foot unfortunately has openings between the toes Rovito, Liandro [name] ([id number]) [id number] Page 9 of 37 Left foot superficial wound looks less aggravated. 08/07/18 Buttock wounds continue to look better although some of her granulation and the larger medial wound. silver alginate Left heel  continues to look [name] lot better.silver collagen Left foot superficial wound looks less stable. Requires debridement. He has [name] new wound superficial area on the foot on the lateral dorsal foot. Right foot looks better using silver alginate without Lotrisone 08/14/2018; patient was in the ER last week diagnosed with [name] UTI. He is now on Cefpodoxime and Macrodantin. Buttock wounds  continued to be smaller. Using silver alginate Left heel continues to look better using silver collagen Left foot superficial wound looks as though it is improving Right dorsal foot area is just about healed. 08/21/2018; patient is completed his antibiotics for his UTI. He has 2 open areas on the buttocks. There is still not closed although the surface looks satisfactory. Using silver alginate Left heel continues to improve using silver collagen The bilateral dorsal foot areas which are at the base of his first and second toes/possible tinea pedis are actually stable on the left but worse on the right. The area on the left required debridement of necrotic surface. After debridement I obtained [name] specimen for PCR culture. The right dorsal foot which is been just about healed last week is now reopened 08/28/2018; culture done on the left dorsal foot showed coag negative staph both staph epidermidis and Lugdunensis. I think this is worthwhile initiating systemic treatment. I will use doxycycline given his long list of allergies. The area on the left heel slightly improved but still requiring debridement. The large wound on the buttock is just about closed whereas the smaller one is larger. Using silver alginate in this area 09/04/2018; patient is completing his doxycycline for the left foot although this continues to be [name] very difficult wound area with very adherent necrotic debris. We are using silver alginate to all his wounds right foot left foot and the small wounds on his buttock, silver collagen on the left heel. 09/11/2018;  once again this patient has intense erythema and swelling of the left forefoot. Lesser degrees of erythema in the right foot. He has [name] long list of allergies and intolerances. I will reinstitute doxycycline. 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 Using silver alginate Left heel also looks better using silver collagen Unfortunately both the areas on his feet look worse. The area on the left first second webspace is now gone through to the plantar part of his foot. The area on the left foot anteriorly is irritated with erythema and swelling in the forefoot. 09/25/2018 His wound on the left plantar heel looks better. Using silver collagen The area on the left buttock 2 small remnant areas. One is closed one is still open. Using silver alginate The areas between both his first and second toes look worse. This in spite of long-standing antifungal therapy with ketoconazole and silver alginate which should have antifungal activity He has small areas around his original wound on the left calf one is on the bottom of the original scar tissue and one superiorly both of these are small and superficial but again given wound history in this site this is worrisome 10/02/2018 Left plantar heel continues to gradually contract using silver collagen Left buttock wound is unchanged using silver alginate The areas on his dorsal feet between his first and second toes bilaterally look about the same. I prescribed clindamycin ointment to see if we can address chronic staph colonization and also the underlying possibility of erythrasma The left lateral lower extremity wound is actually on the lateral part of his ankle. Small open area here. We have been using silver alginate 10/09/2018; Left plantar heel continues to look healthy and contract. No debridement is required Left buttock slightly smaller with [name] tape injury wound just below which was new this week Dorsal  feet somewhat improved I have been using clindamycin Left lateral looks lower extremity the actual open area looks worse  although [name] lot of this is epithelialized. I am going to change to silver collagen today He has [name] lot more swelling in the right leg although this is not pitting not red and not particularly warm there is [name] lot of spasm in the right leg usually indicative of people with paralysis of some underlying discomfort. We have reviewed his vascular status from 2017 he had [name] left greater saphenous vein ablation. I wonder about referring him back to vascular surgery if the area on the left leg continues to deteriorate. 10/16/2018 in today for follow-up and management of multiple lower extremity ulcers. His left Buttock wound is much lower smaller and almost closed completely. The wound to the left ankle has began to reopen with Epithelialization and some adherent slough. He has multiple new areas to the left foot and leg. The left dorsal foot without much improvement. Wound present between left great webspace and 2nd toe. Erythema and edema present right leg. Right LE ultrasound obtained on 10/10/18 was negative for DVT . 10/23/2018; Left buttock is closed over. Still dry macerated skin but there is no open wound. I suspect this is chronic pressure/moisture Left lateral calf is quite [name] bit worse than when I saw this last. There is clearly drainage here he has macerated skin into the left plantar heel. We will change the primary dressing to alginate Left dorsal foot has some improvement in overall wound area. Still using clindamycin and silver alginate Right dorsal foot about the same as the left using clindamycin and silver alginate The erythema in the right leg has resolved. He is DVT rule out was negative Left heel pressure area required debridement although the wound is smaller and the surface is health 10/26/2018 The patient came back in for his nurse check today predominantly because of  the drainage coming out of the left lateral leg with [name] recent reopening of his original wound on the left lateral calf. He comes in today with [name] large amount of surrounding erythema around the wound extending from the calf into the ankle and even in the area on the dorsal foot. He is not systemically unwell. He is not febrile. Nevertheless this looks like cellulitis. We have been using silver alginate to the area. I changed him to [name] regular visit and I am going to prescribe him doxycycline. The rationale here is [name] long list of medication intolerances and [name] history of MRSA. I did not see anything that I thought would provide [name] valuable culture 10/30/2018 Follow-up from his appointment 4 days ago with really an extensive area of cellulitis in the left calf left lateral ankle and left dorsal foot. I put him on doxycycline. He has [name] long list of medication allergies which are true allergy reactions. 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 In any case he is [name] lot better today. The erythema especially in the anterior and lateral left calf is better. He still has left ankle erythema. He also is complaining about increasing edema in the right leg we have only been using Kerlix Coban and he has been doing the wraps at home. Finally he has [name] spotty rash on the medial part of his upper left calf which looks like folliculitis or perhaps wrap occlusion type injury. Small superficial macules not pustules 11/06/18 patient arrives today with again [name] considerable degree of erythema around the wound on the left lateral calf extending into the dorsal ankle and dorsal  foot. This is [name] lot worse than when I saw this last week. He is on doxycycline really with not [name] lot of improvement. He has not been systemically unwell Wounds on the; left heel actually looks improved. Original area on the left foot and proximity to the first and second toes looks about  the same. He has superficial areas on the dorsal foot, anterior calf and then the reopening of his original wound on the left lateral calf which looks about the same The only area he has on the right is the dorsal webspace first and second which is smaller. He has [name] large area of dry erythematous skin on the left buttock small open area here. 11/13/2018; the patient arrives in much better condition. The erythema around the wound on the left lateral calf is [name] lot better. Not sure whether this was the clindamycin or the TCA and ketoconazole or just in the improvement in edema control [stasis dermatitis]. In any case this is [name] lot better. The area on the left heel is very small and just about resolved using silver collagen we have been using silver alginate to the areas on his dorsal feet 11/20/2018; his wounds include the left lateral calf, left heel, dorsal aspects of both feet just proximal to the first second webspace. He is stable to slightly Zajicek, Trayveon [name] ([id number]) [id number] Page 10 of 37 improved. I did not think any changes to his dressings were going to be necessary 11/27/2018 he has [name] reopening on the left buttock which is surrounded by what looks like tinea or perhaps some other form of dermatitis. The area on the left dorsal foot has some erythema around it I have marked this area but I am not sure whether this is cellulitis or not. Left heel is not closed. Left calf the reopening is really slightly longer and probably worse 1/13; in general things look better and smaller except for the left dorsal foot. Area on the left heel is just about closed, left buttock looks better only [name] small wound remains in the skin looks better [using Lotrisone] 1/20; the area on the left heel only has [name] few remaining open areas here. Left lateral calf about the same in terms of size, left dorsal foot slightly larger right lateral foot still not closed. The area on the left buttock  has no open wound and the surrounding skin looks [name] lot better 1/27; the area on the left heel is closed. Left lateral calf better but still requiring extensive debridements. The area on his left buttock is closed. He still has the open areas on the left dorsal foot which is slightly smaller in the right foot which is slightly expanded. We have been using Iodoflex on these areas as well 2/3; left heel is closed. Left lateral calf still requiring debridement using Iodoflex there is no open area on his left buttock however he has dry scaly skin over [name] large area of this. Not really responding well to the Lotrisone. Finally the areas on his dorsal feet at the level of the first second webspace are slightly smaller on the right and about the same on the left. Both of these vigorously debrided with Anasept and gauze 2/10; left heel remains closed he has dry erythematous skin over the left buttock but there is no open wound here. Left lateral leg has come in and with. Still requiring debridement we have been using Iodoflex here. Finally the area on the left dorsal foot and right dorsal  foot are really about the same extremely dry callused fissured areas. He does not yet have [name] dermatology appointment 2/17; left heel remains closed. He has [name] new open area on the left buttock. The area on the left lateral calf is bigger longer and still covered in necrotic debris. No major change in his foot areas bilaterally. I am awaiting for [name] dermatologist to look on this. We have been using ketoconazole I do not know that this is been doing any good at all. 2/24; left heel remains closed. The left buttock wound that was new reopening last week looks better. The left lateral calf appears better also although still requires debridement. The major area on his foot is the left first second also requiring debridement. We have been putting Prisma on all wounds. I do not believe that the ketoconazole has done too much good for  his feet. He will use Lotrisone I am going to give him [name] 2-week course of terbinafine. We still do not have [name] dermatology appointment 3/2 left heel remains closed however there is skin over bone in this area I pointed this out to him today. The left buttock wound is epithelialized but still does not look completely stable. The area on the left leg required debridement were using silver collagen here. With regards to his feet we changed to Lotrisone last week and silver alginate. 3/9; left heel remains closed. Left buttock remains closed. The area on the right foot is essentially closed. The left foot remains unchanged. Slightly smaller on the left lateral calf. Using silver collagen to both of these areas 3/16-Left heel remains closed. Area on right foot is closed. Left lateral calf above the lateral malleolus open wound requiring debridement with easy bleeding. Left dorsal wound proximal to first toe also debrided. Left ischial area open new. Patient has been using Prisma with wrapping every 3 days. Dermatology appointment is apparently tomorrow.Patient has completed his terbinafine 2-week course with some apparent improvement according to him, there is still flaking and dry skin in his foot on the left 3/23; area on the right foot is reopened. The area on the left anterior foot is about the same still [name] very necrotic adherent surface. He still has the area on the left leg and reopening is on the left buttock. He apparently saw dermatology although I do not have [name] note. According to the patient who is usually fairly well informed they did not have any good ideas. Put him on oral terbinafine which she is been on before. 3/30; using silver collagen to all wounds. Apparently his dermatologist put him on doxycycline and rifampin presumably some culture grew staph. I do not have this result. He remains on terbinafine although I have used terbinafine on him before 4/6; patient has had [name] fairly  substantial reopening on the right foot between the first and second toes. He is finished his terbinafine and I believe is on doxycycline and rifampin still as prescribed by dermatology. We have been using silver collagen to all his wounds although the patient reports that he thinks silver alginate does better on the wounds on his buttock. 4/13; the area on his left lateral calf about the same size but it did not require debridement. Left dorsal foot just proximal to the webspace between the first and second toes is about the same. Still nonviable surface. I note some superficial bronze discoloration of the dorsal part of his foot Right dorsal foot just proximal to the first and second toes also  looks about the same. I still think there may be the same discoloration I noted above on the left Left buttock wound looks about the same 4/20; left lateral calf appears to be gradually contracting using silver collagen. 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 The left dorsal foot wound is debrided of tightly adherent necrotic debris and really cleans up quite nicely. The right area is worse with expansion. I did not debride this it is now over the base of the second toe The area on his left buttock is smaller no debridement is required using silver collagen 5/4; left calf continues to make good progress. He arrives with erythema around the wounds on his dorsal foot which even extends to the plantar aspect. Very concerning for coexistent infection. He is finished the erythromycin I gave him for possible erythrasma this does not seem to have helped. The area on the left foot is about the same base of the dorsal toes Is area on the buttock looks improved on the left 5/11; left calf and left buttock continued to make good progress. Left foot is about the same to slightly improved. Major problem is on the right foot. He has not had an x-ray. Deep  tissue culture I did last week showed both Enterobacter and [name]. coli. I did not change the doxycycline I put him on empirically although neither 1 of these were plated to doxycycline. He arrives today with the erythema looking worse on both the dorsal and plantar foot. Macerated skin on the bottom of the foot. he has not been systemically unwell 5/18-Patient returns at 1 week, left calf wound appears to be making some progress, left buttock wound appears slightly worse than last time, left foot wound looks slightly better, right foot redness is marginally better. X-ray of both feet show no air or evidence of osteomyelitis. Patient is finished his Omnicef and terbinafine. He continues to have macerated skin on the bottom of the left foot as well as right 5/26; left calf wound is better, left buttock wound appears to have multiple small superficial open areas with surrounding macerated skin. X-rays that I did last time showed no evidence of osteomyelitis in either foot. He is finished cefdinir and doxycycline. I do not think that he was on terbinafine. He continues to have [name] large superficial open area on the right foot anterior dorsal and slightly between the first and second toes. I did send him to dermatology 2 months ago or so wondering about whether they would do [name] fungal scraping. I do not believe they did but did do [name] culture. We have been using silver alginate to the toe areas, he has been using antifungals at home topically either ketoconazole or Lotrisone. We are using silver collagen on the left foot, silver alginate on the right, silver collagen on the left lateral leg and silver alginate on the left buttock 6/1; left buttock area is healed. We have the left dorsal foot, left lateral leg and right dorsal foot. We are using silver alginate to the areas on both feet and silver collagen to the area on his left lateral calf 6/8; the left buttock apparently reopened late last week. He is not really  sure how this happened. He is tolerating the terbinafine. Using silver alginate to all wounds 6/15; left buttock wound is larger than last week but still superficial. Came in the clinic today with [name] report of purulence from the left lateral leg I did not  identify any infection Both areas on his dorsal feet appear to be better. He is tolerating the terbinafine. Using silver alginate to all wounds 6/22; left buttock is about the same this week, left calf quite [name] bit better. His left foot is about the same however he comes in with erythema and warmth in the right forefoot once again. Culture that I gave him in the beginning of May showed Enterobacter and [name]. coli. I gave him doxycycline and things seem to improve although neither 1 of these organisms was specifically plated. 6/29; left buttock is larger and dry this week. Left lateral calf looks to me to be improved. Left dorsal foot also somewhat improved right foot completely unchanged. The erythema on the right foot is still present. He is completing the Ceftin dinner that I gave him empirically [see discussion above.) 7/6 - All wounds look to be stable and perhaps improved, the left buttock wound is slightly smaller, per patient bleeds easily, completed ceftin, the right foot redness is less, he is on terbinafine 7/13; left buttock wound about the same perhaps slightly narrower. Area on the left lateral leg continues to narrow. Left dorsal foot slightly smaller right foot about the same. We are using silver alginate on the right foot and Hydrofera Blue to the areas on the left. Unna boot on the left 2 layer compression on the right 7/20; left buttock wound absolutely the same. Area on lateral leg continues to get better. Left dorsal foot require debridement as did the right no major change in the Seybold, [name] [name] ([id number]) [id number] Page 11 of 37 7/27; left buttock wound the same size necrotic debris over the surface.  The area on the lateral leg is closed once again. His left foot looks better right foot about the same although there is some involvement now of the posterior first second toe area. He is still on terbinafine which I have given him for [name] month, not certain [name] centimeter major change 06/25/19-All wounds appear to be slightly improved according to report, left buttock wound looks clean, both foot wounds have minimal to no debris the right dorsal foot has minimal slough. We are using Hydrofera Blue to the left and silver alginate to the right foot and ischial wound. 8/10-Wounds all appear to be around the same, the right forefoot distal part has some redness which was not there before, however the wound looks clean and small. Ischial wound looks about the same with no changes 8/17; his wound on the left lateral calf which was his original chronic venous insufficiency wound remains closed. Since I last saw him the areas on the left dorsal foot right dorsal foot generally appear better but require debridement. The area on his left initial tuberosity appears somewhat larger to me perhaps hyper granulated and bleeds very easily. We have been using Hydrofera Blue to the left dorsal foot and silver alginate to everything else 8/24; left lateral calf remains closed. The areas on his dorsal feet on the webspace of the first and second toes bilaterally both look better. The area on the left buttock which is the pressure ulcer stage II slightly smaller. I change the dressing to Hydrofera Blue to all areas 8/31; left lateral calf remains closed. The area on his dorsal feet bilaterally look better. Using Hydrofera Blue. Still requiring debridement on the left foot. No change in the left buttock pressure ulcers however 9/14; left lateral calf remains closed. Dorsal feet look quite [name] bit better than 2 weeks ago.  Flaking dry skin also [name] lot better with the ammonium lactate I gave him 2 weeks ago. The area on the left  buttock is improved. He states that his Roho cushion developed [name] leak and he is getting [name] new one, in the interim he is offloading this vigorously 9/21; left calf remains closed. Left heel which was [name] possible DTI looks better this week. He had macerated tissue around the left dorsal foot right foot looks satisfactory and improved left buttock wound. I changed his dressings to his feet to silver alginate bilaterally. Continuing Hydrofera Blue on the left buttock. 9/28 left calf remains closed. Left heel did not develop anything [possible DTI] dry flaking skin on the left dorsal foot. Right foot looks satisfactory. Improved left buttock wound. We are using silver alginate on his feet Hydrofera Blue on the buttock. I have asked him to go back to the Lotrisone on his feet including the wounds and surrounding areas 10/5; left calf remains closed. The areas on the left and right feet about the same. [name] lot of this is epithelialized however debris over the remaining open areas. He is using Lotrisone and silver alginate. The area on the left buttock using Hydrofera Blue 10/26. Patient has been out for 3 weeks secondary to Covid concerns. He tested negative but I think his wife tested positive. He comes in today with the left foot substantially worse, right foot about the same. Even more concerning he states that the area on his left buttock closed over but then reopened and is considerably deeper in one aspect than it was before [stage III wound] 11/2; left foot really about the same as last week. Quarter sized wound on the dorsal foot just proximal to the first second toes. Surrounding erythema with areas of denuded epithelium. This is not really much different looking. Did not look like cellulitis this time however. Right foot area about the same.. We have been using silver alginate alginate on his toes Left buttock still substantial irritated skin around the wound which I think looks somewhat better. We  have been using Hydrofera Blue here. 11/9; left foot larger than last week and [name] very necrotic surface. Right foot I think is about the same perhaps slightly smaller. Debris around the circumference also addressed. Unfortunately on the left buttock there is been [name] decline. Satellite lesions below the major wound distally and now [name] an additional one posteriorly we have been using Hydrofera Blue but I think this is [name] pressure issue 11/16; left foot ulcer dorsally again [name] very adherent necrotic surface. Right foot is about the same. Not much change in the pressure ulcer on his left buttock. 11/30; left foot ulcer dorsally basically the same as when I saw him 2 weeks ago. Very adherent fibrinous debris on the wound surface. Patient reports [name] lot of drainage as well. 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 We have been using Iodoflex, patient changed back to alginate because of the drainage. Area on his right dorsal foot really looks benign with [name] healthier surface certainly [name] lot better than on the left. Left buttock wounds all improved using Hydrofera Blue 12/7; left dorsal foot again no improvement. Tightly adherent debris. PCR culture I did last week only showed likely skin contaminant. I have gone ahead and done [name] punch biopsy of this which is about the last thing in terms of investigations I can think to do. He has known venous insufficiency and venous hypertension  and this could be the issue here. The area on the right foot is about the same left buttock slightly worse according to our intake nurse secondary to [facility] Blue sticking to the wound 12/14; biopsy of the left foot that I did last time showed changes that could be related to wound healing/chronic stasis dermatitis phenomenon no neoplasm. We have been using silver alginate to both feet. I change the one on the left today to Sorbact and silver alginate to his other 2 wounds 12/28; the  patient arrives with the following problems; Major issue is the dorsal left foot which continues to be [name] larger deeper wound area. Still with [name] completely nonviable surface Paradoxically the area mirror image on the right on the right dorsal foot appears to be getting better. He had some loss of dry denuded skin from the lower part of his original wound on the left lateral calf. Some of this area looked [name] little vulnerable and for this reason we put him in wrap that on this side this week The area on his left buttock is larger. He still has the erythematous circular area which I think is [name] combination of pressure, sweat. This does not look like cellulitis or fungal dermatitis 11/26/2019; -Dorsal left foot large open wound with depth. Still debris over the surface. Using Sorbact The area on the dorsal right foot paradoxically has closed over He has [name] reopening on the left ankle laterally at the base of his original wound that extended up into the calf. This appears clean. The left buttock wound is smaller but with very adherent necrotic debris over the surface. We have been using silver alginate here as well The patient had arterial studies done in 2017. He had biphasic waveforms at the dorsalis pedis and posterior tibial bilaterally. ABI in the left was 1.17. Digit waveforms were dampened. He has slight spasticity in the great toes I do not think [name] TBI would be possible 1/11; the patient comes in today with [name] sizable reopening between the first and second toes on the right. This is not exactly in the same location where we have been treating wounds previously. According to our intake nurse this was actually fairly deep but 0.6 cm. The area on the left dorsal foot looks about the same the surface is somewhat cleaner using Sorbact, his MRI is in 2 days. We have not managed yet to get arterial studies. The new reopening on the left lateral calf looks somewhat better using alginate. The left buttock wound  is about the same using alginate 1/18; the patient had his ARTERIAL studies which were quite normal. ABI in the right at 1.13 with triphasic/biphasic waveforms on the left ABI 1.06 again with triphasic/biphasic waveforms. It would not have been possible to have done [name] toe brachial index because of spasticity. We have been using Sorbac to the left foot alginate to the rest of his wounds on the right foot left lateral calf and left buttock 1/25; arrives in clinic with erythema and swelling of the left forefoot worse over the first MTP area. This extends laterally dorsally and but also posteriorly. Still has an area on the left lateral part of the lower part of his calf wound it is eschared and clearly not closed. Area on the left buttock still with surrounding irritation and erythema. Right foot surface wound dorsally. The area between the right and first and second toes appears better. 2/1; The left foot wound is about the same. Erythema slightly better  I gave him [name] week of doxycycline empirically Right foot wound is more extensive extending between the toes to the plantar surface Left lateral calf really no open surface on the inferior part of his original wound however the entire area still looks vulnerable Absolutely no improvement in the left buttock wound required debridement. 2/8; the left foot is about the same. Erythema is slightly improved I gave him clindamycin last week. Right foot looks better he is using Lotrimin and silver alginate He has [name] breakdown in the left lateral calf. Denuded epithelium which I have removed Left buttock about the same were using Hydrofera Blue 2/15; left foot is about the same there is less surrounding erythema. Surface still has tightly adherent debris which I have debriding however not making any progress Right foot has [name] substantial wound on the medial right second toe between the first and second webspace. Still an open area on the left lateral calf  distal area. Buttock wound is about the same 2/22; left foot is about the same less surrounding erythema. Surface has adherent debris. Polymen Ag [name] [name], [name] [name] ([id number]) [id number] Page 12 of 37 Right foot area significant wound between the first and second toes. We have been using silver alginate here Left lateral leg polymen Ag at the base of his original venous insufficiency wound Left buttock some improvement here 3/1; Right foot is deteriorating in the first second toe webspace. Larger and more substantial. We have been using silver alginate. Left dorsal foot about the same markedly adherent surface debris using PolyMem Ag Left lateral calf surface debris using PolyMem AG Left buttock is improved again using PolyMem Ag. He is completing his terbinafine. The erythema in the foot seems better. He has been on this for 2 weeks 3/8; no improvement in any wound area in fact he has [name] small open area on the dorsal midfoot which is new this week. He has not gotten his foot x-rays yet 3/15; his x-rays were both negative for osteomyelitis of both feet. No major change in any of his wounds on the extremities however his buttock wounds are better. We have been using polymen on the buttocks, left lower leg. Iodoflex on the left foot and silver alginate on the right 3/22; arrives in clinic today with the 2 major issues are the improvement in the left dorsal foot wound which for once actually looks healthy with [name] nice healthy wound surface without debridement. Using Iodoflex here. Unfortunately on the left lateral calf which is in the distal part of his original wound he came to the clinic here for there was purulent drainage noted some increased breakdown scattered around the original area and [name] small area proximally. We we are using polymen here will change to silver alginate today. His buttock wound on the left is better and I think the area on the right first second toe  webspace is also improved 3/29; left dorsal foot looks better. Using Iodoflex. Left ankle culture from deterioration last time grew [name]. coli, Enterobacter and Enterococcus. I will give him [name] course of cefdinir although that will not cover Enterococcus. The area on the right foot in the webspace of the first and second toe lateral first toe looks better. The area on his buttock is about healed Vascular appointment is on April 21. This is to look at his venous system vis--vis continued breakdown of the wounds on the left including the left lateral leg and left dorsal foot he. He has had previous ablations on this  side 4/5; the area between the right first and second toes lateral aspect of the first toe looks better. Dorsal aspect of the left first toe on the left foot also improved. Unfortunately the left lateral lower leg is larger and there is [name] second satellite wound superiorly. The usual superficial abrasions on the left buttock overall better but certainly not closed 4/12; the area between the right first and second toes is improved. Dorsal aspect of the left foot also slightly smaller with [name] vibrant healthy looking surface. No real change in the left lateral leg and the left buttock wound is healed He has an unaffordable co-pay for Apligraf. Appointment with vein and vascular with regards to the left leg venous part of the circulation is on 4/21 4/19; we continue to see improvement in all wound areas. Although this is minor. He has his vascular appointment on 4/21. The area on the left buttock has not reopened although right in the center of this area the skin looks somewhat threatened 4/26; the left buttock is unfortunately reopened. In general his left dorsal foot has [name] healthy surface and looks somewhat smaller although it was not measured as such. The area between his first and second toe webspace on the right as [name] small wound against the first toe. The patient saw vascular surgery. The real  question I was asking was about the small saphenous vein on the left. He has previously ablated left greater saphenous vein. Nothing further was commented on on the left. Right greater saphenous vein without reflux at the saphenofemoral junction or proximal thigh there was no indication for ablation of the right greater saphenous vein duplex was negative for DVT bilaterally. They did not think there was anything from [name] point of view that could be offered. They ABIs within normal limits 5/3; only small open area on the left buttock. The area on the left lateral leg which was his original venous reflux is now 2 wounds both which look clean. We are using Iodoflex on the left dorsal foot which looks healthy and smaller. He is down to [name] very tiny area between the right first and second toes, using silver alginate 5/10; all of his wounds appear better. We have much better edema control in 4 layer compression on the left. This may be the factor that is allowing the left foot and left lateral calf to heal. He has external compression garments at home 04/14/20-All of his wounds are progressing well, the left forefoot is practically closed, left ischium appears to be about the same, right toe webspace is also smaller. The left lateral leg is about the same, continue using Hydrofera Blue to this, silver alginate to the ischium, Iodoflex to the toe space on the right 6/7; most of his wounds outside of the left buttock are doing well. The area on the left lateral calf and left dorsal foot are smaller. The area on the right foot in between the first and second toe webspace is barely visible although he still says there is some drainage here is the only reason I did not heal this out. Unfortunately the area on the left buttock almost looks like he has [name] skin tear from tape. He has open wound and then [name] large flap of skin that we are trying to get adherence over an area just next to the remaining  wound 6/21; 2 week follow-up. I believe is been here for nurse visits. Miraculously the area between his first and second toes  on the left dorsal foot is closed over. Still open on the right first second web space. The left lateral calf has 2 open areas. Distally this is more superficial. The proximal area had [name] little more depth and required debridement of adherent necrotic material. His buttock wound is actually larger we have been using silver alginate here 6/28; the patient's area on the left foot remains closed. Still open wet area between the first and second toes on the right and also extending into the plantar aspect. We have been using silver alginate in this location. He has 2 areas on the left lower leg part of his original long wounds which I think are better. We have been using Hydrofera Blue here. Hydrofera Blue to the left buttock which is stable 7/12; left foot remains closed. Left ankle is closed. May be [name] small area between his right first and second toes the only truly open area is on the left buttock. We have been using Hydrofera Blue here 7/19; patient arrives with marked deterioration especially in the left foot and ankle. We did not put him in [name] compression wrap on the left last week in fact he wore his juxta lite stockings on either side although he does not have an underlying stocking. He has [name] reopening on the left dorsal foot, left lateral ankle and [name] new area on the right dorsal ankle. More worrisome is the degree of erythema on the left foot extending on the lateral foot into the lateral lower leg on the left 7/26; the patient had erythema and drainage from the lateral left ankle last week. Culture of this grew MRSA resistant to doxycycline and clindamycin which are the 2 antibiotics we usually use with this patient who has multiple antibiotic allergies including linezolid, trimethoprim sulfamethoxazole. I had give him an empiric doxycycline and he comes in the area  certainly looks somewhat better although it is blotchy in his lower leg. He has not been systemically unwell. He has had areas on the left dorsal foot which is [name] reopening, chronic wounds on the left lateral ankle. Both of these I think are secondary to chronic venous insufficiency. The area between his first and second toes is closed as far as I can tell. He had [name] new wrap injury on the right dorsal ankle last week. Finally he has an area on the left buttock. We have been using silver alginate to everything except the left buttock we are using Hydrofera Blue 06/30/20-Patient returns at 1 week, has been given [name] sample dose pack of NUZYRA which is [name] tetracycline derivative [omadacycline], patient has completed those, we have been using silver alginate to almost all the wounds except the left ischium where we are using Hydrofera Blue all of them look better 8/16; since I last saw the patient he has been doing well. The area on the left buttock, left lateral ankle and left foot are all closed today. He has completed the [place] I gave him last time and tolerated this well. He still has open areas on the right dorsal ankle and in the right first second toe area which we are using silver alginate. 8/23; we put him in his bilateral external compression stockings last week as he did not have anything open on either leg except for concerning area between the right first and second toe. He comes in today with an area on the left dorsal foot slightly more proximal than the original wound, the left lateral foot but this is actually  [name] continuation of the area he had on the left lateral ankle from last time. As well he is opened up on the left buttock again. 8/30; comes in today with things looking [name] lot better. The area on the left lower ankle has closed down as has the left foot but with eschar in both areas. The area on the dorsal right ankle is also epithelialized. Very little remaining of the left buttock wound.  We have been using silver alginate on all wound areas 9/13; the area in the first second toe webspace on the right has fully epithelialized. He still has some vulnerable epithelium on the right and the ankle and the dorsal foot. He notes weeping. He is using his juxta lite stocking. On the left again the left dorsal foot is closed left lateral ankle is closed. We went to the juxta lite stocking here as well. Still vulnerable in the left buttock although only 2 small open areas remain here 9/27; 2-week follow-up. We did not look at his left leg but the patient says everything is closed. He is [name] bit disturbed by the amount of edema in his left foot he is using juxta lite stockings but asking about over the toes stockings which would be 30/40, will talk to him next time. According to him there is no open wound on either the left foot or the left ankle/calf He has an open area on the dorsal right calf which I initially point [name] wrap injury. He has superficial remaining wound on the left ischial tuberosity been using silver alginate although he says this sticks to the wound 10/5; we gave him 2-week follow-up but he called yesterday expressing some concerns about his right foot right ankle and the left buttock. He came in early. There is still no open areas on the left leg and that still in his juxta lite stocking 10/11; he only has 1 small area on the left buttock that remains measuring millimeters 1 mm. Still has the same irritated skin in this area. We recommended Faiola, Ignace [name] ([id number]) [id number] Page 13 of 37 zinc oxide when this eventually closes and pressure relief is meticulously is he can do this. He still has an area on the dorsal part of his right first through third toes which is [name] bit irritated and still open and on the dorsal ankle near the crease of the ankle. We have been using silver alginate and using his own stocking. He has nothing open on the left leg or  foot 10/25; 2-week follow-up. Not nearly as good on the left buttock as I was hoping. For open areas with 5 looking threatened small. He has the erythematous irritated chronic skin in this area. 1 area on the right dorsal ankle. He reports this area bleeds easily Right dorsal foot just proximal to the base of his toes We have been using silver alginate. 11/8; 2-week follow-up. Left buttock is about the same although I do not think the wounds are in the same location we have been using silver alginate. I have asked him to use zinc oxide on the skin around the wounds. He still has [name] small area on the right dorsal ankle he reports this bleeds easily Right dorsal foot just proximal to the base of the toes does not have anything open although the skin is very dry and scaly He has [name] new opening on the nailbed of the left great toe. Nothing on the left ankle 11/29; 3-week follow-up. Left buttock has  2 open areas. And washing of these wounds today started bleeding easily. Suggesting very friable tissue. We have been using silver alginate. Right dorsal ankle which I thought was initially [name] wrap injury we have been using silver alginate. Nothing open between the toes that I can see. He states the area on the left dorsal toe nailbed healed after the last visit in 2 or 3 days 12/13; 3-week follow-up. His left buttock now has 3 open areas but the original 2 areas are smaller using polymen here. Surrounding skin looks better. The right dorsal ankle is closed. He has [name] small opening on the right dorsal foot at the level of the third toe. In general the skin looks better here. He is wearing his juxta lite stocking on the left leg says there is nothing open 11/24/2020; 3 weeks follow-up. His left buttock still has the 3 open areas. We have been using polymen but due to lack of response he changed to [facility] area. Surrounding skin is dry erythematous and irritated looking. There is no evidence of infection  either bacterial or fungal however there is loss of surface epithelium He still has very dry skin in his foot causing irritation and erythema on the dorsal part of his toes. This is not responded to prolonged courses of antifungal simply looks dry and irritated 1/24; left buttock area still looks about the same he was unable to find the triad ointment that we had suggested. The area on the right lower leg just above the dorsal ankle has reopened and the areas on the right foot between the first second and second third toes and scaling on the bottom of the foot has been about the same for quite some time now. been using silver alginate to all wound areas 2/7; left buttock wound looked quite good although not much smaller in terms of surface area surrounding skin looks better. Only [name] few dry flaking areas on the right foot in between the first and second toes the skin generally looks better here [ammonium lactate]. Finally the area on the right dorsal ankle is closed 2/21; There is no open area on the right foot even between the right first and second toe. Skin around this area dorsally and plantar aspects look better. He has [name] reopening of the area on the right ankle just above the crease of the ankle dorsally. I continue to think that this is probably friction from spasms may be even this time with his stocking under the compression stockings. Wounds on his left buttock look about the same there [name] couple of areas that have reopened. He has [name] total square area of loss of epithelialization. This does not look like infection it looks like [name] contact dermatitis but I just cannot determine to what 3/14; there is nothing on the right foot between the first and second toes this was carefully inspected under illumination. Some chronic irritation on the dorsal part of his foot from toes 1-3 at the base. Nothing really open here substantially. Still has an area on the right foot/ankle that is actually larger  and hyper granulated. His buttock area on the left is just about closed however he has chronic inflammation with loss of the surface epithelial layer 3/28; 2-week follow-up. In clinic today with [name] new wound on the left anterior mid tibia. Says this happened about 2 weeks ago. He is not really sure how wonders about the spasticity of his legs at night whether that could have caused this other  than that he does not have [name] good idea. He has been using topical antibiotics and silver alginate. The area on his right dorsal ankle seems somewhat better. Finally everything on his left buttock is closed. 4/11; 2-week follow-up. All of his wounds are better except for the area over the ischium and left buttock which have opened up widely again. At least part of this is covered in necrotic fibrinous material another part had rolled nonviable skin. The area on the right ankle, left anterior mid tibia are both [name] lot better. He had no open wounds on either foot including the areas between the first and second toes 4/25; patient presents for 2-week follow-up. He states that the wounds are overall stable. He has no complaints today and states he is using Hydrofera Blue to open wounds. 5/9; have not seen this man in over [name] month. For my memory he has open areas on the left mid tibia and right ankle. T oday he has new open area on the right dorsal foot which we have not had [name] problem with recently. He has the sustained area on the left buttock He is also changed his insurance at the beginning of the year [employer]. We will need prior authorizations for debridement 5/23; patient presents for 2-week follow-up. He has prior authorizations for debridement. He denies any issues in the past 2 weeks with his wound care. He has been using Hydrofera Blue to all the wounds. He does report [name] circular rash to the upper left leg that is new. He denies acute signs of infection. 6/6; 2-week follow-up. The patient has open wounds  on the left buttock which are worse than the last time I saw this about [name] month ago. He also has [name] new area to me on the left anterior mid tibia with some surrounding erythema. The area on the dorsal ankle on the right is closed but I think this will be [name] friction injury every time this area is exposed to either our wraps or his compression stockings caused by unrelenting spasms in this leg. 6/20; 2-week follow-up. The patient has open wounds on the left buttock which is about the same. Using [facility] here. - The left mid tibia has [name] static amount of surrounding erythema. Also [name] raised area in the center. We have been using Hydrofera Blue here. Finally he has broken down in his dorsal right foot extending between the first and second toes and going to the base of the first and second toe webspace. I have previously assumed that this was severe venous hypertension 7/5; 2-week follow-up The left buttock wound actually looks better. We are using Hydrofera Blue. He has extensive skin irritation around this area and I have not really been able to get that any better. I have tried Lotrisone i.[name]. antifungals and steroids. More most recently we have just been using Coloplast really looks about the same. The left mid tibia which was new last week culture to have very resistant staph aureus. Not only methicillin-resistant but doxycycline resistant. The patient has [name] plethora of antibiotic allergies including sulfa, linezolid. I used topical bacitracin on this but he has not started this yet. In addition he has an expanding area of erythema with [name] wound on the dorsal right foot. I did [name] deep tissue culture of this area today 7/12; Left buttock area actually looks better surrounding skin also looks less irritated. Left mid tibia looks about the same. He is using bacitracin this is not worse  Right dorsal foot looks about the same as well. The left first toe also looks about the same 7/19; left buttock  wound continues to improve in terms of open areas Left mid tibia is still concerning amount of swelling he is using bacitracin Dorsal left first toe somewhat smaller Right dorsal foot somewhat smaller 7/25; left buttock wound actually continues to improve Left mid tibia area has less swelling. I gave him all my samples of new Nuzyra. This seems to have helped although the wound is still open it. His abrasion closed by here Left dorsal great toe really no better. Still [name] very nonviable surface Right dorsal foot perhaps some better. We have been using bacitracin and silver nitrate to the areas on his lower legs and Hydrofera Blue to the area on the buttock. 8/16 Disappointed that his left buttock wound is actually more substantial. Apparently during the last nurse visit these were both very small. He has continued irritation to [name] large area of skin on his buttock. I have never been able to totally explain this although I think it some combination of the way he sits, pressure, moisture. He is not incontinent enough to contribute to this. Left dorsal great toe still fibrinous debris on the surface that I have debrided today Large area across the dorsal right toes. Foglesong, LOVIE ERBEN ([id number]) [id number] Page 14 of 37 The area on the left anterior mid tibia has less swelling. He completed the [place]. This does not look infected although the tissue is still fried 8/30; 2-week follow-up. Left buttock areas not improved. We used Hydrofera Blue on this. Weeping wet with the surrounding erythema that I have not been able to control even with Lotrisone and topical Coloplast Left dorsal great toe looks about the same More substantial area again at the base of his toes on the left which is new this week. Area across the dorsal right toes looks improved The left anterior mid tibia looks like it is trying to close 9/13; 2-week follow-up. Using silver alginate on all of his wounds.  The left dorsal foot does not look any better. He has the area on the dorsal toe and also the areas at the base of all of his toes 1 through 3. On the right foot he has [name] similar pattern in [name] similar area. He has the area on his left mid tibia that looks fairly healthy. Finally the area on his left buttock looks somewhat bette 9/20; culture I did of the left foot which was [name] deep tissue culture last time showed [name]. coli he has erythema around this wound. Still [name] completely necrotic surface. His right dorsal foot looks about the same. He has [name] very friable surface to the left anterior mid tibia. Both buttock wounds look better. We have been using silver alginate to all wounds 10/4; he has completed the cephalexin that I gave him last time for the left foot. He is using topical gentamicin under silver alginate silver alginate being applied to all the wounds. Unfortunately all the wounds look irritated on his dorsal right foot dorsal left foot left mid tibia. I wonder if this could be [name] silver allergy. I am going to change him to [facility] on the lower extremity. The skin on the left buttock and left posterior thigh still flaking dry and irritated. This has continued no matter what I have applied topically to this. He has [name] solitary open wound which by itself does not look too bad however the entire  area of surrounding skin does not change no matter what we have applied here 10/18; the area on the left dorsal foot and right dorsal foot both look better. The area on the right extends into the plantar but not between his toes. We have been using silver alginate. He still has [name] rectangular erythematous area around the area on the left tibia. The wound itself is very small. Finally everything on his left buttock looks [name] little larger the skin is erythematous 11/15; patient comes in with the left dorsal and right dorsal foot distally looking somewhat better. Still nonviable surface on the left foot which  required debridement. He still has the area on the left anterior mid tibia although this looks somewhat better. He has [name] new area on the right lateral lower leg just above the ankle. Finally his left buttock looks terrible with multiple superficial open wounds geometric square shaped area of chronic erythema which I have not been able to sort through 11/29; right dorsal foot and left dorsal foot both look somewhat better. No debridement required. He has the fragile area on the left anterior mid tibia this looks and continues to look somewhat better. Right lateral lower leg just above the ankle we identified last time also looks better. In general the area on his left buttock looks improved. We are using Hydrofera Blue to all wound areas 12/13; right dorsal foot looks better. The area on the right lateral leg is healed. Left dorsal foot has 2 open areas both of which require debridement. The fragile area on the left anterior mid tibial looks better. Smaller area on his buttocks. Were using Hydrofera Blue 1/10; patient comes in with everything looking slightly larger and/or worse. This includes his left buttock, reopening of the left mid tibia, larger areas on the left dorsal foot and what looks to be [name] cellulitis on the right dorsal and plantar foot. We have been using Hydrofera Blue on all wounds. 1/17; right dorsal foot distally looks better today. The left foot has 2 open wounds that are about the same surrounding erythema. 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 No malignancy they did not stain for fungus 1/24; his right distal foot is not closed dry and scaly but the wound looks like it is contracting. I did not debride anything here. Problem on his left dorsal foot with expanding erythema. Apparently there were problems last week getting the Elesa Hacker however it  is now available at the [facility] but [name] week later. He is using ketoconazole and Coloplast to the left buttock along with Hydrofera Blue this actually looks quite [name] bit better today. 1/31; right dorsal foot again is dry and scaly but looking to contract. He has been using [name] moisturizer on his feet at my request but he is not sure which 1. The left dorsal foot wounds look about the same there is erythema here that I marked last week however after course of Nuzyra it certainly is not any better but not any worse either. Finally on his left buttock the skin continues to look better he has the original wound but [name] new substantial area towards the gluteal cleft. Almost like [name] skin tear. I used scissors to remove skin and subcutaneous tissue here silver nitrate and direct pressure 2/7; right dorsal foot. This does not look too much different from last week. Some erythema skin dry and  scaly. No debridement. Left dorsal great toe again still not much improvement. I did remove flaking dry skin and callus from around the edge. Finally on his left buttock. The skin is somewhat better in the periwound. Surface wounds are superficial somewhat better than last week. 01/26/2022: Is [name] little bit of [name] mystery as to why his wounds fail to respond to treatments and actually seem to get worse. This is my first encounter with this patient. He was previously followed by Dr. Dellia Nims. Based upon my review of the chart, it seems that there is [name] little bit of [name] mystery as to why his wounds do not respond as anticipated to the interventions applied and sometimes even get worse. Biopsies have been performed and he was seen by dermatology in [place], but that did not shed any light on the matter. T oday, his gluteal wound is larger, with substantial drainage, rather malodorous. The food wounds are not terrible, but he has [name] lot of callus and scaly skin around these. He is currently getting silver alginate on the gluteal  wound, with idodoflex to the feet. He is using lotrisone on his legs for the dry, scaly skin. 02/09/2022: There has really been no change to any of his wounds. The gluteal wound less drainage and odor, but remains about the same size, the periwound skin remains oddly scaly. His lower extremity wounds also appear roughly the same size. They continue to accumulate [name] small amount of slough. The periwound on his feet and ankle wounds has dry eschar and loose dead skin. We have been using silver alginate on the gluteal wound and Iodoflex on his feet and ankle wounds. T the periwound around his gluteal lesion and Lotrisone on his feet and legs. o 02/23/2022: The right plantar foot wound is closed. The gluteal site looks small but has continued to produce hypertrophic granulation tissue. The foot wounds all look about the same on the dorsal surface of the right foot; on the left, there is only [name] small open area at the site of where his left great toenail would have been. 03/16/2022: The right ankle wound is healed. The right dorsal foot wound is about the same. The left dorsal foot wound is quite [name] bit smaller and the ischial wound is nearly closed. 03/30/2022: The right ankle wound reopened. Both dorsal foot wounds are quite [name] bit smaller. Unfortunately, he appears to have sheared part of his ischial wound open further, perhaps during [name] transfer. 04/13/2022: The right ankle wound has hypertrophic granulation tissue present. The dorsal foot wounds continue to decrease in size. The ischial wound looks about the same today, no better, no worse. 04/27/2022: The right ankle wound has closed. Unfortunately, it looks like some moisture got underneath the dry skin on both of his dorsal feet and these wounds have expanded in size. The ischial wound remains the same with perhaps [name] little bit more slough accumulation than at our previous visit. 05/11/2022: The right ankle wound remains closed. There is [name] left anterior tibial  wound that is small has patchy openings with accumulated slough. The dorsum of his right foot appears to be nearly healed with just [name] small punctate opening. The plantar surface of his right foot has [name] new opening that looks like he may have picked some skin there. His sacral ulcer has hypertrophic granulation tissue but has some slough accumulation. The dorsum of his left foot has multiple open areas in [name] fairly ragged distribution. All of these have slough accumulated  within them. 06/01/2022: The right ankle and left anterior tibial wound are both closed. Dorsum of his right foot and left foot both look substantially better with just tiny Zettlemoyer, Reinhart [name] ([id number]) [id number] Page 15 of 37 scattered openings The without any slough accumulation. He has sheared open new areas on his left gluteus and ischium. He says that his wheelchair cushion, which is air-filled, has [name] leak and so it keeps deflating. He is awaiting [name] new cushion. 06/15/2022: The right ankle wound has reopened and the fat layer is exposed. Both dorsal feet have just small openings with just [name] little bit of slough and eschar accumulation. The wound on his left gluteus and ischium is larger again today and has [name] foul odor. 06/29/2022: The right ankle wound has hypertrophic granulation tissue buildup. His dorsal foot wounds both look better with just some eschar on the surface. He has [name] new wound on his left lateral ankle. He is not sure how he acquired it but by appearance, it looks that he hit it on something, potentially his wheelchair or bed. The ischial wound is about the same but is cleaner without any significant purulent drainage or odor. He did not understand what the [facility] call was about and therefore he does not have the topical compounded antibiotic. 07/13/2022: The right ankle wound again has hypertrophic granulation tissue, but less so than at his previous visit. The ischial wound has  improved tremendously the use of the [facility] topical antibiotic. No significant change to the left lateral ankle wound; it is fibrotic with slough present. The skin of both of his feet, especially on the right, has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 however the Apligraf dressing was inadvertently removed therefore we elected to prep the wound for [name] second Apligraf. I put him on doxycycline on 6/1 the erythema in the foot 05/03/16 we did not remove the dressing from the superior wound as this is where I put all of his last Apligraf. Surface debridement done with [name] curette of the lower wound which looks very healthy. The area on the left foot also looks quite satisfactory at the dorsal artery at the first and second toes 05/10/16; continue Apligraf to this. Her wound, Hydrafera to the lower wound. He has [name] new area on the right second toe. Left dorsal foot firstoosecond toe also looks improved 05/24/16; wound dimensions must be smaller I was able to use Apligraf to all 3 remaining wound areas. 06/07/16 patient's last Apligraf was 2 weeks ago. He arrives today with the 2 wounds on his lateral left leg joined together. This would have to be seen as [name] negative. He also has [name] small wound in his first and  second toe on the left dorsally with quite [name] bit of surrounding erythema in the first second and third toes. This looks to be infected or inflamed, very difficult clinical call. 06/21/16: lateral left leg combined wounds. Adherent surface slough area on  the left dorsal foot at roughly the fourth toe looks improved 07/12/16; he now has [name] single linear wound on the lateral left leg. This does not look to be [name] lot changed from when I lost saw this. The area on his dorsal left foot looks considerably better however. 08/02/16; no major change in the substantial area on his left lateral leg since last time. We have been using Hydrofera Blue for [name] prolonged period of time now. The area on his left foot is also unchanged from last review 07/19/16; the area on his dorsal foot on the left looks considerably smaller. He is beginning to have significant rims of epithelialization on the lateral left leg wound. This also looks better. 08/05/16; the patient came in for [name] nurse visit today. Apparently the area on his left lateral leg looks better and it was wrapped. However in general discussion the patient noted [name] new area on the dorsal aspect of his right second toe. The exact etiology of this is unclear but likely relates to pressure. 08/09/16 really the area on the left lateral leg did not really look that healthy today perhaps slightly larger and measurements. The area on his dorsal right second toe is improved also the left foot wound looks stable to improved 08/16/16; the area on the last lateral leg did not change any of dimensions. Post debridement with [name] curet the area looked better. Left foot wound improved and the area on the dorsal right second toe is improved 08/23/16; the area on the left lateral leg may be slightly smaller both in terms of length and width. Aggressive debridement with [name] curette afterwards the tissue appears healthier. Left foot wound appears improved in the area on the dorsal right second toe is improved 08/30/16 patient developed [name] fever over the weekend and was seen in an urgent care. Felt to have [name] UTI and put on doxycycline. He has been since changed over the phone to [facility]. After we took off the wrap on his right leg today the leg  is swollen warm and erythematous, probably more likely the source of the fever 09/06/16; have been using collagen to the major left leg wound, silver alginate to the area on his anterior foot/toes 09/13/16; the areas on his anterior foot/toes on both sides appear to be virtually closed. Extensive wound on the left lateral leg perhaps slightly narrower but each visit still covered an adherent surface slough 09/16/16 patient was in for his usual Thursday nurse visit however the intake nurse noted significant erythema of his dorsal right foot. He is also running [name] low- grade fever and having increasing spasms in the right leg 09/20/16 here for cellulitis involving his right great toes and forefoot. This is [name] lot better. Still requiring debridement on his left lateral leg. Santyl direct says he needs prior authorization. Therefore his wife cannot change this at home 09/30/16; the patient's extensive area on the left lateral calf and ankle perhaps somewhat better. Using Santyl. The area on the left toes is healed and I think the Jelinek, Damonta [name] ([id number]) [id number] Page 21 of 37 area on his right dorsal foot is healed as well. There is no cellulitis or venous inflammation involving the right leg. He is going to need  compression stockings here. 10/07/16; the patient's extensive wound on the left lateral calf and ankle does not measure any differently however there appears to be less adherent surface slough using Santyl and aggressive weekly debridements 10/21/16; no major change in the area on the left lateral calf. Still the same measurement still very difficult to debridement adherent slough and nonviable subcutaneous tissue. This is not really been helped by several weeks of Santyl. Previously for 2 weeks I used Iodoflex for [name] short period. [name] prolonged course of Hydrofera Blue didn't really help. I'm not sure why I only used 2 weeks of Iodoflex on this there is no evidence  of surrounding infection. He has [name] small area on the right second toe which looks as though it's progressing towards closure 10/28/16; the wounds on his toes appear to be closed. No major change in the left lateral leg wound although the surface looks somewhat better using Iodoflex. He has had previous arterial studies that were normal. He has had reflux studies and is status post ablation although I don't have any exact notes on which vein was ablated. I'll need to check the surgical record 11/04/16; he's had [name] reopening between the first and second toe on the left and right. No major change in the left lateral leg wound. There is what appears to be cellulitis of the left dorsal foot 11/18/16 the patient was hospitalized initially in [place] and then subsequently transferred to [facility] long and was admitted there from 11/09/16 through 11/12/16. He had developed progressive cellulitis on the right leg in spite of the doxycycline I gave him. I'd spoken to the hospitalist in [place] who was concerned about continuing leukocytosis. CT scan is what I suggested this was done which showed soft tissue swelling without evidence of osteomyelitis or an underlying abscess blood cultures were negative. At [facility] he was treated with vancomycin and Primaxin and then add an infectious disease consult. He was transitioned to Ceftaroline. He has been making progressive improvement. Overall [name] severe cellulitis of the right leg. He is been using silver alginate to her original wound on the left leg. The wounds in his toes on the right are closed there is [name] small open area on the base of the left second toe 11/26/15; the patient's right leg is much better although there is still some edema here this could be reminiscent from his severe cellulitis likely on top of some degree of lymphedema. His left anterior leg wound has less surface slough as reported by her intake nurse. Small wound at the base of the left second  toe 12/02/16; patient's right leg is better and there is no open wound here. His left anterior lateral leg wound continues to have [name] healthy-looking surface. Small wound at the base of the left second toe however there is erythema in the left forefoot which is worrisome 12/16/16; is no open wounds on his right leg. We took measurements for stockings. His left anterior lateral leg wound continues to have [name] healthy-looking surface. I'm not sure where we were with the Apligraf run through his insurance. We have been using Iodoflex. He has [name] thick eschar on the left first second toe interface, I suspect this may be fungal however there is no visible open 12/23/16; no open wound on his right leg. He has 2 small areas left of the linear wound that was remaining last week. We have been using Prisma, I thought I have disclosed this week, we can only look forward to next  week 01/03/17; the patient had concerning areas of erythema last week, already on doxycycline for UTI through his primary doctor. The erythema is absolutely no better there is warmth and swelling both medially from the left lateral leg wound and also the dorsal left foot. 01/06/17- Patient is here for follow-up evaluation of his left lateral leg ulcer and bilateral feet ulcers. He is on oral antibiotic therapy, tolerating that. Nursing staff and the patient states that the erythema is improved from Monday. 01/13/17; the predominant left lateral leg wound continues to be problematic. I had put Apligraf on him earlier this month once. However he subsequently developed what appeared to be an intense cellulitis around the left lateral leg wound. I gave him Dalvance I think on 2/12 perhaps 2/13 he continues on cefdinir. The erythema is still present but the warmth and swelling is improved. I am hopeful that the cellulitis part of this control. I wouldn't be surprised if there is an element of venous inflammation as well. 01/17/17. The erythema is present  but better in the left leg. His left lateral leg wound still does not have [name] viable surface buttons certain parts of this long thin wound it appears like there has been improvement in dimensions. 01/20/17; the erythema still present but much better in the left leg. I'm thinking this is his usual degree of chronic venous inflammation. The wound on the left leg looks somewhat better. Is less surface slough 01/27/17; erythema is back to the chronic venous inflammation. The wound on the left leg is somewhat better. I am back to the point where I like to try an Apligraf once again 02/10/17; slight improvement in wound dimensions. Apligraf #2. He is completing his doxycycline 02/14/17; patient arrives today having completed doxycycline last Thursday. This was supposed to be [name] nurse visit however once again he hasn't tense erythema from the medial part of his wound extending over the lower leg. Also erythema in his foot this is roughly in the same distribution as last time. He has baseline chronic venous inflammation however this is [name] lot worse than the baseline I have learned to accept the on him is baseline inflammation 02/24/17- patient is here for follow-up evaluation. He is tolerating compression therapy. His voicing no complaints or concerns he is here anticipating an Apligraf 03/03/17; he arrives today with an adherent necrotic surface. I don't think this is surface is going to be amenable for Apligraf's. The erythema around his wound and on the left dorsal foot has resolved he is off antibiotics 03/10/17; better-looking surface today. I don't think he can tolerate Apligraf's. He tells me he had [name] wound VAC after [name] skin graft years ago to this area and they had difficulty with [name] seal. The erythema continues to be stable around this some degree of chronic venous inflammation but he also has recurrent cellulitis. We have been using Iodoflex 03/17/17; continued improvement in the surface and may be small changes  in dimensions. Using Iodoflex which seems the only thing that will control his surface 03/24/17- He is here for follow up evaluation of his LLE lateral ulceration and ulcer to right dorsal foot/toe space. He is voicing no complaints or concerns, He is tolerating compression wrap. 03/31/17 arrives today with [name] much healthier looking wound on the left lower extremity. We have been using Iodoflex for [name] prolonged period of time which has for the first time prepared and adequate looking wound bed although we have not had much in the way of wound  dimension improvement. He also has [name] small wound between the first and second toe on the right 04/07/17; arrives today with [name] healthy-looking wound bed and at least the top 50% of this wound appears to be now her. No debridement was required I have changed him to [facility] last week after prolonged Iodoflex. He did not do well with Apligraf's. We've had [name] re-opening between the first and second toe on the right 04/14/17; arrives today with [name] healthier looking wound bed contractions and the top 50% of this wound and some on the lesser 50%. Wound bed appears healthy. The area between the first and second toe on the right still remains problematic 04/21/17; continued very gradual improvement. Using [facility] 04/28/17; continued very gradual improvement in the left lateral leg venous insufficiency wound. His periwound erythema is very mild. We have been using Hydrofera Blue. Wound is making progress especially in the superior 50% 05/05/17; he continues to have very gradual improvement in the left lateral venous insufficiency wound. Both in terms with an length rings are improving. I debrided this every 2 weeks with #5 curet and we have been using Hydrofera Blue and again making good progress With regards to the wounds between his right first and second toe which I thought might of been tinea pedis he is not making as much progress very dry scaly skin over the area.  Also the area at the base of the left first and second toe in [name] similar condition 05/12/17; continued gradual improvement in the refractory left lateral venous insufficiency wound on the left. Dimension smaller. Surface still requiring debridement using Hydrofera Blue 05/19/17; continued gradual improvement in the refractory left lateral venous ulceration. Careful inspection of the wound bed underlying rumination suggested some degree of epithelialization over the surface no debridement indicated. Continue Hydrofera Blue difficult areas between his toes first and third on the left than first and second on the right. I'm going to change to silver alginate from silver collagen. Continue ketoconazole as I suspect underlying tinea pedis 05/26/17; left lateral leg venous insufficiency wound. We've been using Hydrofera Blue. I believe that there is expanding epithelialization over the surface of the wound albeit not coming from the wound circumference. This is [name] bit of an odd situation in which the epithelialization seems to be coming from the surface of the wound rather than in the exact circumference. There is still small open areas mostly along the lateral margin of the wound. ooHe has unchanged areas between the left first and second and the right first second toes which I been treating for tenia pedis 06/02/17; left lateral leg venous insufficiency wound. We have been using Hydrofera Blue. Somewhat smaller from the wound circumference. The surface of the wound remains [name] bit on it almost epithelialized sedation in appearance. I use an open curette today debridement in the surface of all of this especially the edges ooSmall open wounds remaining on the dorsal right first and second toe interspace and the plantar left first second toe and her face on the left 06/09/17; wound on the left lateral leg continues to be smaller but very gradual and very dry surface using Hydrofera Blue 06/16/17 requires weekly  debridements now on the left lateral leg although this continues to contract. I changed to silver collagen last week because of dryness of the wound bed. Using Iodoflex to the areas on his first and second toes/web space bilaterally 06/24/17; patient with history of paraplegia also chronic venous insufficiency with lymphedema. Has [name]  very difficult wound on the left lateral leg. This has been [name] [name], [name] [name] ([id number]) [id number] Page 22 of 37 gradually reducing in terms of with but comes in with [name] very dry adherent surface. High switch to silver collagen [name] week or so ago with hydrogel to keep the area moist. This is been refractory to multiple dressing attempts. He also has areas in his first and second toes bilaterally in the anterior and posterior web space. I had been using Iodoflex here after [name] prolonged course of silver alginate with ketoconazole was ineffective [question tinea pedis] 07/14/17; patient arrives today with [name] very difficult adherent material over his left lateral lower leg wound. He also has surrounding erythema and poorly controlled edema. He was switched his Santyl last visit which the nurses are applying once during his doctor visit and once on [name] nurse visit. He was also reduced to 2 layer compression I'm not exactly sure of the issue here. 07/21/17; better surface today after 1 week of Iodoflex. Significant cellulitis that we treated last week also better. [Doxycycline] 07/28/17 better surface today with now 2 weeks of Iodoflex. Significant cellulitis treated with doxycycline. He has now completed the doxycycline and he is back to his usual degree of chronic venous inflammation/stasis dermatitis. He reminds me he has had ablations surgery here 08/04/17; continued improvement with Iodoflex to the left lateral leg wound in terms of the surface of the wound although the dimensions are better. He is not currently on any antibiotics, he has the usual degree of  chronic venous inflammation/stasis dermatitis. Problematic areas on the plantar aspect of the first second toe web space on the left and the dorsal aspect of the first second toe web space on the right. At one point I felt these were probably related to chronic fungal infections in treated him aggressively for this although we have not made any improvement here. 08/11/17; left lateral leg. Surface continues to improve with the Iodoflex although we are not seeing much improvement in overall wound dimensions. Areas on his plantar left foot and right foot show no improvement. In fact the right foot looks somewhat worse 08/18/17; left lateral leg. We changed to [facility] Blue last week after [name] prolonged course of Iodoflex which helps get the surface better. It appears that the wound with is improved. Continue with difficult areas on the left dorsal first second and plantar first second on the right 09/01/17; patient arrives in clinic today having had [name] temperature of 103 yesterday. He was seen in the ER and [facility]. The patient was concerned he could have cellulitis again in the right leg however they diagnosed him with [name] UTI and he is now on Keflex. He has [name] history of cellulitis which is been recurrent and difficult but this is been in the left leg, in the past 5 use doxycycline. He does in and out catheterizations at home which are risk factors for UTI 09/08/17; patient will be completing his Keflex this weekend. The erythema on the left leg is considerably better. He has [name] new wound today on the medial part of the right leg small superficial almost looks like [name] skin tear. He has worsening of the area on the right dorsal first and second toe. His major area on the left lateral leg is better. Using Hydrofera Blue on all areas 09/15/17; gradual reduction in width on the long wound in the left lateral leg. No debridement required. He also has wounds on the plantar aspect of his left first  second toe web  space and on the dorsal aspect of the right first second toe web space. 09/22/17; there continues to be very gradual improvements in the dimensions of the left lateral leg wound. He hasn't round erythematous spot with might be pressure on his wheelchair. There is no evidence obviously of infection no purulence no warmth ooHe has [name] dry scaled area on the plantar aspect of the left first second toe ooImproved area on the dorsal right first second toe. 09/29/17; left lateral leg wound continues to improve in dimensions mostly with an is still [name] fairly long but increasingly narrow wound. ooHe has [name] dry scaled area on the plantar aspect of his left first second toe web space ooIncreasingly concerning area on the dorsal right first second toe. In fact I am concerned today about possible cellulitis around this wound. The areas extending up his second toe and although there is deformities here almost appears to abut on the nailbed. 10/06/17; left lateral leg wound continues to make very gradual progress. Tissue culture I did from the right first second toe dorsal foot last time grew MRSA and enterococcus which was vancomycin sensitive. This was not sensitive to clindamycin or doxycycline. He is allergic to Zyvox and sulfa we have therefore arrange for him to have dalvance infusion tomorrow. He is had this in the past and tolerated it well 10/20/17; left lateral leg wound continues to make decent progress. This is certainly reduced in terms of with there is advancing epithelialization.ooThe cellulitis in the right foot looks better although he still has [name] deep wound in the dorsal aspect of the first second toe web space. Plantar left first toe web space on the left I think is making some progress 10/27/17; left lateral leg wound continues to make decent progress. Advancing epithelialization.using Hydrofera Blue ooThe right first second toe web space wound is better-looking using silver  alginate ooImprovement in the left plantar first second toe web space. Again using silver alginate 11/03/17 left lateral leg wound continues to make decent progress albeit slowly. Using Hydrofera Blue ooThe right per second toe web space continues to be [name] very problematic looking punched out wound. I obtained [name] piece of tissue for deep culture I did extensively treated this for fungus. It is difficult to imagine that this is [name] pressure area as the patient states other than going outside he doesn't really wear shoes at home ooThe left plantar first second toe web space looked fairly senescent. Necrotic edges. This required debridement oochange to Hydrofera Blue to all wound areas 11/10/17; left lateral leg wound continues to contract. Using Hydrofera Blue ooOn the right dorsal first second toe web space dorsally. Culture I did of this area last week grew MRSA there is not an easy oral option in this patient was multiple antibiotic allergies or intolerances. This was only [name] rare culture isolate I'm therefore going to use Bactroban under silver alginate ooOn the left plantar first second toe web space. Debridement is required here. This is also unchanged 11/17/17; left lateral leg wound continues to contract using Hydrofera Blue this is no longer the major issue. ooThe major concern here is the right first second toe web space. He now has an open area going from dorsally to the plantar aspect. There is now wound on the inner lateral part of the first toe. Not [name] very viable surface on this. There is erythema spreading medially into the forefoot. ooNo major change in the left first second toe plantar wound 11/24/17;  left lateral leg wound continues to contract using Hydrofera Blue. Nice improvement today ooThe right first second toe web space all of this looks [name] lot less angry than last week. I have given him clindamycin and topical Bactroban for MRSA and terbinafine for the possibility of  underlining tinea pedis that I could not control with ketoconazole. Looks somewhat better ooThe area on the plantar left first second toe web space is weeping with dried debris around the wound 12/01/17; left lateral leg wound continues to contract he Hydrofera Blue. It is becoming thinner in terms of with nevertheless it is making good improvement. ooThe right first second toe web space looks less angry but still [name] large necrotic-looking wounds starting on the plantar aspect of the right foot extending between the toes and now extensively on the base of the right second toe. I gave him clindamycin and topical Bactroban for MRSA anterior benefiting for the possibility of underlying tinea pedis. Not looking better today ooThe area on the left first/second toe looks better. Debrided of necrotic debris 12/05/17* the patient was worked in urgently today because over the weekend he found blood on his incontinence bad when he woke up. He was found to have an ulcer by his wife who does most of his wound care. He came in today for [place] to look at this. He has not had [name] history of wounds in his buttocks in spite of his paraplegia. 12/08/17; seen in follow-up today at his usual appointment. He was seen earlier this week and found to have [name] new wound on his buttock. We also follow him for wounds on the left lateral leg, left first second toe web space and right first second toe web space 12/15/17; we have been using Hydrofera Blue to the left lateral leg which has improved. The right first second toe web space has also improved. Left first second toe web space plantar aspect looks stable. The left buttock has worsened using Santyl. Apparently the buttock has drainage 12/22/17; we have been using Hydrofera Blue to the left lateral leg which continues to improve now 2 small wounds separated by normal skin. He tells [place] he had [name] fever up to 100 yesterday he is prone to UTIs but has not noted anything different. He does  in and out catheterizations. The area between the first and second toes today does not look good necrotic surface covered with what looks to be purulent drainage and erythema extending into the third toe. I had gotten this to something that I thought look better last time however it is not look good today. He also has [name] necrotic surface over the buttock wound which is expanded. I thought there might be infection under here so I removed [name] lot of the surface with [name] #5 curet though nothing look like it really needed culturing. He is been using Santyl to this area 12/27/17; his original wound on the left lateral leg continues to improve using Hydrofera Blue. I gave him samples of Baxdella although he was unable to take them out of fear for an allergic reaction ["lump in his throat"].the culture I did of the purulent drainage from his second toe last week showed both enterococcus and [name] set Enterobacter I was also concerned about the erythema on the bottom of his foot although paradoxically although this looks somewhat better today. 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 We've been using silver  alginate here. They came up today that he sleeps in [name] recliner, I'm not sure why but I asked him to stop this 01/03/18; his original wound we've been using Hydrofera Blue is now separated into 2 areas. ooUlcer on his left buttock is better he is off the recliner and sleeping in bed ooFinally both wound areas between his first and second toes also looks some better 01/10/18; his original wound on the left lateral leg is now separated into 2 wounds we've been using Hydrofera Blue ooUlcer on his left buttock has some drainage. There is [name] small probing site going into muscle layer superiorly.using silver alginate [name] [name], [name] [name] ([id number]) [id number] Page 23 of 5 -He arrives today with [name] deep  tissue injury on the left heel ooThe wound on the dorsal aspect of his first second toe on the left looks [name] lot betterusing silver alginate ketoconazole ooThe area on the first second toe web space on the right also looks [name] lot bette 01/17/18; his original wound on the left lateral leg continues to progress using Hydrofera Blue ooUlcer on his left buttock also is smaller surface healthier except for [name] small probing site going into the muscle layer superiorly. 2.4 cm of tunneling in this area ooDTI on his left heel we have only been offloading. Looks better than last week no threatened open no evidence of infection oothe wound on the dorsal aspect of the first second toe on the left continues to look like it's regressing we have only been using silver alginate and terbinafine orally ooThe area in the first second toe web space on the right also looks to be [name] lot better using silver alginate and terbinafine I think this was prompted by tinea pedis 01/31/18; the patient was hospitalized in [place] last week apparently for [name] complicated UTI. He was discharged on cefepime he does in and out catheterizations. In the hospital he was discovered M I don't mild elevation of AST and ALT and the terbinafine was stopped.predictably the pressure ulcer on s his buttock looks betterusing silver alginate. The area on the left lateral leg also is better using Hydrofera Blue. The area between the first and second toes on the left better. First and second toes on the right still substantial but better. Finally the DTI on the left heel has held together and looks like it's resolving 02/07/18-he is here in follow-up evaluation for multiple ulcerations. He has new injury to the lateral aspect of the last issue [name] pressure ulcer, he states this is from adhesive removal trauma. He states he has tried multiple adhesive products with no success. All other ulcers appear stable. The left heel DTI is resolving. We will  continue with same treatment plan and follow-up next week. 02/14/18; follow-up for multiple areas. ooHe has [name] new area last week on the lateral aspect of his pressure ulcer more over the posterior trochanter. The original pressure ulcer looks quite stable has healthy granulation. 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 Almost fully epithelialized on the original superior area using Hydrofera Blue ooDTI on the left heel has peeled off this week to reveal [name] small superficial wound under denuded skin and subcutaneous tissue ooBoth areas between the first and second toes look better including nothing open on the left 02/21/18; ooThe patient's wounds on his left ischial tuberosity and posterior left greater trochanter actually looked better. He has [name] large area of  irritation around the area which I think is contact dermatitis. I am doubtful that this is fungal ooHis original wound on the left lateral calf continues to improve we have been using Hydrofera Blue ooThere is no open area in the left first second toe web space although there is [name] lot of thick callus ooThe DTI on the left heel required debridement today of necrotic surface eschar and subcutaneous tissue using silver alginate ooFinally the area on the right first second toe webspace continues to contract using silver alginate and ketoconazole 02/28/18 ooLeft ischial tuberosity wounds look better using silver alginate. ooOriginal wound on the left calf only has one small open area left using Hydrofera Blue ooDTI on the left heel required debridement mostly removing skin from around this wound surface. Using silver alginate ooThe areas on the right first/second toe web space using silver alginate and ketoconazole 03/08/18 on evaluation today patient appears to be doing decently well as best I can tell in regard to his wounds. This  is the first time that I have seen him as he generally is followed by Dr. Dellia Nims. With that being said none of his wounds appear to be infected he does have an area where there is some skin covering what appears to be [name] new wound on the left dorsal surface of his great toe. This is right at the nail bed. With that being said I do believe that debrided away some of the excess skin can be of benefit in this regard. Otherwise he has been tolerating the dressing changes without complication. 03/14/18; patient arrives today with the multiplicity of wounds that we are following. He has not been systemically unwell ooOriginal wound on the left lateral calf now only has 2 small open areas we've been using Hydrofera Blue which should continue ooThe deep tissue injury on the left heel requires debridement today. We've been using silver alginate ooThe left first second toe and the right first second toe are both are reminiscence what I think was tinea pedis. Apparently some of the callus Surface between the toes was removed last week when it started draining. ooPurulent drainage coming from the wound on the ischial tuberosity on the left. 03/21/18-He is here in follow-up evaluation for multiple wounds. 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 He tolerated debridement. The only change to last week's recommendations is to discontinue antifungal cream between toes. He will follow-up next week 03/28/18; following up for multiple wounds;Concern this week is streaking redness and swelling in the right foot. He is going to need antibiotics for this. 03/31/18; follow-up for right foot cellulitis. Streaking redness and swelling in the right foot on 03/28/18. 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 I put him on clindamycin 300 mg every 6 and brought him in for [name] quick check. He has an open wound between his first and second  toes on the right foot as [name] potential source. 04/04/18; ooRight foot cellulitis is resolving he is completing clindamycin. This is truly good news ooLeft lateral calf wound which is initial wound only has one small open area inferiorly this is close to healing out. He has compression stockings. We will use Hydrofera Blue right down to the epithelialization of this ooNonviable surface on the left heel which was initially pressure with [name] DTI. We've been using Hydrofera Blue. I'm going to switch this back to silver alginate ooLeft first second toe/tinea pedis this looks better using silver  alginate ooRight first second toe tinea pedis using silver alginate ooLarge pressure ulcers on theLeft ischial tuberosity. Small wound here Looks better. I am uncertain about the surface over the large wound. Using silver alginate 04/11/18; ooCellulitis in the right foot is resolved ooLeft lateral calf wound which was his original wounds still has 2 tiny open areas remaining this is just about closed ooNonviable surface on the left heel is better but still requires debridement ooLeft first second toe/tinea pedis still open using silver alginate ooRight first second toe wound tinea pedis I asked him to go back to using ketoconazole and silver alginate ooLarge pressure ulcers on the left ischial tuberosity this shear injury here is resolved. Wound is smaller. No evidence of infection using silver alginate 04/18/18; ooPatient arrives with an intense area of cellulitis in the right mid lower calf extending into the right heel area. Bright red and warm. Smaller area on the left anterior leg. He has [name] significant history of MRSA. He will definitely need antibioticsoodoxycycline ooHe now has 2 open areas on the left ischial tuberosity the original large wound and now [name] satellite area which I think was above his initial satellite areas. Not [name] wonderful surface on this satellite area surrounding erythema  which looks like pressure related. ooHis left lateral calf wound again his original wound is just about closed ooLeft heel pressure injury still requiring debridement ooLeft first second toe looks [name] lot better using silver alginate ooRight first second toe also using silver alginate and ketoconazole cream also looks better 04/20/18; the patient was worked in early today out of concerns with his cellulitis on the right leg. I had started him on doxycycline. This was 2 days ago. His wife was concerned about the swelling in the area. Also concerned about the left buttock. He has not been systemically unwell no fever chills. No nausea vomiting or diarrhea 04/25/18; the patient's left buttock wound is continued to deteriorate he is using Hydrofera Blue. He is still completing clindamycin for the cellulitis on the right leg although all of this looks better. 05/02/18 ooLeft buttock wound still with [name] lot of drainage and [name] very tightly adherent fibrinous necrotic surface. He has [name] deeper area superiorly ooThe left lateral calf wound is still closed ooDTI wound on the left heel necrotic surface especially the circumference using Iodoflex ooAreas between his left first second toe and right first second toe both look better. Dorsally and the right first second toe he had [name] necrotic surface although at [place], [place] ([id number]) [phone].pdf Page 24 of 37 smaller. In using silver alginate and ketoconazole. I did [name] culture last week which was [name] deep tissue culture of the reminiscence of the open wound on the right first second toe dorsally. This grew [name] few Acinetobacter and [name] few methicillin-resistant staph aureus. Nevertheless the area actually this week looked better. I didn't feel the need to specifically address this at least in terms of systemic antibiotics. 05/09/18; wounds are measuring larger more drainage per our intake. We are using Santyl covered with alginate on the  large superficial buttock wounds, Iodosorb on the left heel, ketoconazole and silver alginate to the dorsal first and second toes bilaterally. 05/16/18; ooThe area on his left buttock better in some aspects although the area superiorly over the ischial tuberosity required an extensive debridement.using Santyl ooLeft heel appears stable. Using Iodoflex ooThe areas between his first and second toes are not bad however there is spreading erythema up the dorsal aspect of his left  foot this looks like cellulitis again. He is insensate the erythema is really very brilliant.o Erysipelas He went to see an allergist days ago because he was itching part of this he had lab work done. This showed [name] white count of 15.1 with 70% neutrophils. Hemoglobin of 11.4 and [name] platelet count of 659,000. Last white count we had in Epic was [name] 2-1/2 years ago which was 25.9 but he was ill at the time. He was able to show me some lab work that was done by his primary physician the pattern is about the same. 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 But prompted me to go ahead and do x-rays of both feet and the pelvis rule out osteomyelitis. He also had [name] comprehensive metabolic panel this was reasonably normal his albumin was 3.7 liver function tests BUN/creatinine all normal 05/23/18; x-rays of both his feet from last week were negative for underlying pulmonary abnormality. The x-ray of his pelvis however showed mild irregularity in the left ischial which may represent some early osteomyelitis. The wound in the left ischial continues to get deeper clearly now exposed muscle. Each week necrotic surface material over this area. Whereas the rest of the wounds do not look so bad. ooThe left ischial wound we have been using Santyl and calcium alginate ooT the left heel surface necrotic debris using Iodoflex o ooThe left lateral leg is still healed ooAreas on the left dorsal foot  and the right dorsal foot are about the same. There is some inflammation on the left which might represent contact dermatitis, fungal dermatitis I am doubtful cellulitis although this looks better than last week 05/30/18; CT scan done at Hospital did not show any osteomyelitis or abscess. Suggested the possibility of underlying cellulitis although I don't see [name] lot of evidence of this at the bedside ooThe wound itself on the left buttock/upper thigh actually looks somewhat better. No debridement ooLeft heel also looks better no debridement continue Iodoflex ooBoth dorsal first second toe spaces appear better using Lotrisone. Left still required debridement 06/06/18; ooIntake reported some purulent looking drainage from the left gluteal wound. Using Santyl and calcium alginate ooLeft heel looks better although still [name] nonviable surface requiring debridement ooThe left dorsal foot first/second webspace actually expanding and somewhat deeper. I may consider doing [name] shave biopsy of this area ooRight dorsal foot first/second webspace appears stable to improved. Using Lotrisone and silver alginate to both these areas 06/13/18 ooLeft gluteal surface looks better. Now separated in the 2 wounds. No debridement required. Still drainage. We'll continue silver alginate ooLeft heel continues to look better with Iodoflex continue this for at least another week ooOf his dorsal foot wounds the area on the left still has some depth although it looks better than last week. We've been using Lotrisone and silver alginate 06/20/18 ooLeft gluteal continues to look better healthy tissue ooLeft heel continues to look better healthy granulation wound is smaller. He is using Iodoflex and his long as this continues continue the Iodoflex ooDorsal right foot looks better unfortunately dorsal left foot does not. There is swelling and erythema of his forefoot. He had minor trauma to this several days ago but doesn't  think this was enough to have caused any tissue injury. Foot looks like cellulitis, we have had this problem before 06/27/18 on evaluation today patient appears to be doing [name] little worse in regard to his foot ulcer. Unfortunately it does appear that he has  methicillin-resistant staph aureus and unfortunately there really are no oral options for him as he's allergic to sulfa drugs as well as I box. Both of which would really be his only options for treating this infection. In the past he has been given and effusion of Orbactiv. This is done very well for him in the past again it's one time dosing IV antibiotic therapy. Subsequently I do believe this is something we're gonna need to see about doing at this point in time. Currently his other wounds seem to be doing somewhat better in my pinion I'm pretty happy in that regard. 07/03/18 on evaluation today patient's wounds actually appear to be doing fairly well. He has been tolerating the dressing changes without complication. All in all he seems to be showing signs of improvement. In regard to the antibiotics he has been dealing with infectious disease since I saw him last week as far as getting this scheduled. In the end [facility]'s [facility] this coming Friday. In the meantime he has been continuing to perform the dressing changes in such as previous. There does not appear to be any evidence of infection worsengin at this time. 07/10/18; ooSince I last saw this man 2 weeks ago things have actually improved. IV antibiotics of resulted in less forefoot erythema although there is still some present. He is not systemically unwell ooLeft buttock wounds o2 now have no depth there is increased epithelialization Using silver alginate ooLeft heel still requires debridement using Iodoflex ooLeft dorsal foot still with [name] sizable wound about the size of [name] border but healthy granulation ooRight dorsal foot  still with [name] slitlike area using silver alginate 07/18/18; the patient's cellulitis in the left foot is improved in fact I think it is on its way to resolving. ooLeft buttock wounds o2 both look better although the larger one has hypertension granulation we've been using silver alginate ooLeft heel has some thick circumferential redundant skin over the wound edge which will need to be removed today we've been using Iodoflex ooLeft dorsal foot is still [name] sizable wound required debridement using silver alginate ooThe right dorsal foot is just about closed only [name] small open area remains here 07/25/18; left foot cellulitis is resolved ooLeft buttock wounds o2 both look better. Hyper-granulation on the major area ooLeft heel as some debris over the surface but otherwise looks [name] healthier wound. Using silver collagen ooRight dorsal foot is just about closed 07/31/18; arrives with our intake nurse worried about purulent drainage from the buttock. We had hyper-granulation here last week ooHis buttock wounds o2 continue to look better ooLeft heel some debris over the surface but measuring smaller. ooRight dorsal foot unfortunately has openings between the toes ooLeft foot superficial wound looks less aggravated. 08/07/18 ooButtock wounds continue to look better although some of her granulation and the larger medial wound. silver alginate ooLeft heel continues to look [name] lot better.silver collagen ooLeft foot superficial wound looks less stable. Requires debridement. He has [name] new wound superficial area on the foot on the lateral dorsal foot. ooRight foot looks better using silver alginate without Lotrisone 08/14/2018; patient was in the ER last week diagnosed with [name] UTI. He is now on Cefpodoxime and Macrodantin. ooButtock wounds continued to be smaller. Using silver alginate ooLeft heel continues to look better using silver collagen ooLeft foot superficial wound looks as though it is  improving ooRight dorsal foot area is just about healed. 08/21/2018;  patient is completed his antibiotics for his UTI. ooHe has 2 open areas on the buttocks. There is still not closed although the surface looks satisfactory. Using silver alginate ooLeft heel continues to improve using silver collagen ooThe bilateral dorsal foot areas which are at the base of his first and second toes/possible tinea pedis are actually stable on the left but worse on the right. The area on the left required debridement of necrotic surface. After debridement I obtained [name] specimen for PCR culture. Kuhrt, [name] [name] ([id number]) [id number] Page 25 of 37 ooThe right dorsal foot which is been just about healed last week is now reopened 08/28/2018; culture done on the left dorsal foot showed coag negative staph both staph epidermidis and Lugdunensis. I think this is worthwhile initiating systemic treatment. I will use doxycycline given his long list of allergies. The area on the left heel slightly improved but still requiring debridement. ooThe large wound on the buttock is just about closed whereas the smaller one is larger. Using silver alginate in this area 09/04/2018; patient is completing his doxycycline for the left foot although this continues to be [name] very difficult wound area with very adherent necrotic debris. We are using silver alginate to all his wounds right foot left foot and the small wounds on his buttock, silver collagen on the left heel. 09/11/2018; once again this patient has intense erythema and swelling of the left forefoot. Lesser degrees of erythema in the right foot. He has [name] long list of allergies and intolerances. I will reinstitute doxycycline. 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 Using silver alginate ooLeft heel also looks better using silver collagen ooUnfortunately both the areas on his feet look worse. The  area on the left first second webspace is now gone through to the plantar part of his foot. The area on the left foot anteriorly is irritated with erythema and swelling in the forefoot. 09/25/2018 ooHis wound on the left plantar heel looks better. Using silver collagen ooThe area on the left buttock 2 small remnant areas. One is closed one is still open. Using silver alginate ooThe areas between both his first and second toes look worse. This in spite of long-standing antifungal therapy with ketoconazole and silver alginate which should have antifungal activity ooHe has small areas around his original wound on the left calf one is on the bottom of the original scar tissue and one superiorly both of these are small and superficial but again given wound history in this site this is worrisome 10/02/2018 ooLeft plantar heel continues to gradually contract using silver collagen ooLeft buttock wound is unchanged using silver alginate ooThe areas on his dorsal feet between his first and second toes bilaterally look about the same. 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 Small open area here. We have been using silver alginate 10/09/2018; ooLeft plantar heel continues to look healthy and contract. No debridement is required ooLeft buttock slightly smaller with [name] tape injury wound just below which was new this week ooDorsal feet somewhat improved I have been using clindamycin ooLeft lateral looks lower extremity the actual open area looks worse although [name] lot of this is epithelialized. I am going to change to silver collagen today He has [name] lot more swelling in the right leg although  this is not pitting not red and not particularly warm there is [name] lot of spasm in the right leg usually indicative of people with  paralysis of some underlying discomfort. We have reviewed his vascular status from 2017 he had [name] left greater saphenous vein ablation. I wonder about referring him back to vascular surgery if the area on the left leg continues to deteriorate. 10/16/2018 in today for follow-up and management of multiple lower extremity ulcers. His left Buttock wound is much lower smaller and almost closed completely. The wound to the left ankle has began to reopen with Epithelialization and some adherent slough. He has multiple new areas to the left foot and leg. The left dorsal foot without much improvement. Wound present between left great webspace and 2nd toe. Erythema and edema present right leg. Right LE ultrasound obtained on 10/10/18 was negative for DVT . 10/23/2018; ooLeft buttock is closed over. Still dry macerated skin but there is no open wound. I suspect this is chronic pressure/moisture ooLeft lateral calf is quite [name] bit worse than when I saw this last. There is clearly drainage here he has macerated skin into the left plantar heel. We will change the primary dressing to alginate ooLeft dorsal foot has some improvement in overall wound area. Still using clindamycin and silver alginate ooRight dorsal foot about the same as the left using clindamycin and silver alginate ooThe erythema in the right leg has resolved. He is DVT rule out was negative ooLeft heel pressure area required debridement although the wound is smaller and the surface is health 10/26/2018 ooThe patient came back in for his nurse check today predominantly because of the drainage coming out of the left lateral leg with [name] recent reopening of his original wound on the left lateral calf. He comes in today with [name] large amount of surrounding erythema around the wound extending from the calf into the ankle and even in the area on the dorsal foot. He is not systemically unwell. He is not febrile. Nevertheless this looks like cellulitis.  We have been using silver alginate to the area. I changed him to [name] regular visit and I am going to prescribe him doxycycline. The rationale here is [name] long list of medication intolerances and [name] history of MRSA. I did not see anything that I thought would provide [name] valuable culture 10/30/2018 ooFollow-up from his appointment 4 days ago with really an extensive area of cellulitis in the left calf left lateral ankle and left dorsal foot. I put him on doxycycline. He has [name] long list of medication allergies which are true allergy reactions. 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 In any case he is [name] lot better today. The erythema especially in the anterior and lateral left calf is better. He still has left ankle erythema. He also is complaining about increasing edema in the right leg we have only been using Kerlix Coban and he has been doing the wraps at home. Finally he has [name] spotty rash on the medial part of his upper left calf which looks like folliculitis or perhaps wrap occlusion type injury. Small superficial macules not pustules 11/06/18 patient arrives today with again [name] considerable degree of erythema around the wound on the left lateral calf extending into the dorsal ankle and dorsal foot. This is [name] lot worse than when I saw this last week. He is on doxycycline really with not [name] lot of improvement. He has  not been systemically unwell Wounds on the; left heel actually looks improved. Original area on the left foot and proximity to the first and second toes looks about the same. He has superficial areas on the dorsal foot, anterior calf and then the reopening of his original wound on the left lateral calf which looks about the same ooThe only area he has on the right is the dorsal webspace first and second which is smaller. ooHe has [name] large area of dry erythematous skin on the left buttock small open area here. 11/13/2018;  the patient arrives in much better condition. The erythema around the wound on the left lateral calf is [name] lot better. Not sure whether this was the clindamycin or the TCA and ketoconazole or just in the improvement in edema control [stasis dermatitis]. In any case this is [name] lot better. The area on the left heel is very small and just about resolved using silver collagen we have been using silver alginate to the areas on his dorsal feet 11/20/2018; his wounds include the left lateral calf, left heel, dorsal aspects of both feet just proximal to the first second webspace. He is stable to slightly improved. I did not think any changes to his dressings were going to be necessary 11/27/2018 he has [name] reopening on the left buttock which is surrounded by what looks like tinea or perhaps some other form of dermatitis. The area on the left dorsal foot has some erythema around it I have marked this area but I am not sure whether this is cellulitis or not. Left heel is not closed. Left calf the reopening is really slightly longer and probably worse 1/13; in general things look better and smaller except for the left dorsal foot. Area on the left heel is just about closed, left buttock looks better only [name] small wound remains in the skin looks better [using Lotrisone] 1/20; the area on the left heel only has [name] few remaining open areas here. Left lateral calf about the same in terms of size, left dorsal foot slightly larger right lateral foot still not closed. The area on the left buttock has no open wound and the surrounding skin looks [name] lot better 1/27; the area on the left heel is closed. Left lateral calf better but still requiring extensive debridements. The area on his left buttock is closed. He still has the open areas on the left dorsal foot which is slightly smaller in the right foot which is slightly expanded. We have been using Iodoflex on these areas as well 2/3; left heel is closed. Left lateral calf still  requiring debridement using Iodoflex there is no open area on his left buttock however he has dry scaly skin over [name] large area of this. Not really responding well to the Lotrisone. Finally the areas on his dorsal feet at the level of the first second webspace are slightly smaller on the right and about the same on the left. Both of these vigorously debrided with Anasept and gauze 2/10; left heel remains closed he has dry erythematous skin over the left buttock but there is no open wound here. Left lateral leg has come in and with. Still requiring debridement we have been using Iodoflex here. Finally the area on the left dorsal foot and right dorsal foot are really about the same extremely dry callused fissured areas. He does not yet have [name] dermatology appointment Puls, Grace [name] ([id number]) [id number] Page 26 of 37 2/17; left heel remains closed. He has  [name] new open area on the left buttock. The area on the left lateral calf is bigger longer and still covered in necrotic debris. No major change in his foot areas bilaterally. I am awaiting for [name] dermatologist to look on this. We have been using ketoconazole I do not know that this is been doing any good at all. 2/24; left heel remains closed. The left buttock wound that was new reopening last week looks better. The left lateral calf appears better also although still requires debridement. The major area on his foot is the left first second also requiring debridement. We have been putting Prisma on all wounds. I do not believe that the ketoconazole has done too much good for his feet. He will use Lotrisone I am going to give him [name] 2-week course of terbinafine. We still do not have [name] dermatology appointment 3/2 left heel remains closed however there is skin over bone in this area I pointed this out to him today. The left buttock wound is epithelialized but still does not look completely stable. The area on the left leg required  debridement were using silver collagen here. With regards to his feet we changed to Lotrisone last week and silver alginate. 3/9; left heel remains closed. Left buttock remains closed. The area on the right foot is essentially closed. The left foot remains unchanged. Slightly smaller on the left lateral calf. Using silver collagen to both of these areas 3/16-Left heel remains closed. Area on right foot is closed. Left lateral calf above the lateral malleolus open wound requiring debridement with easy bleeding. Left dorsal wound proximal to first toe also debrided. Left ischial area open new. Patient has been using Prisma with wrapping every 3 days. Dermatology appointment is apparently tomorrow.Patient has completed his terbinafine 2-week course with some apparent improvement according to him, there is still flaking and dry skin in his foot on the left 3/23; area on the right foot is reopened. The area on the left anterior foot is about the same still [name] very necrotic adherent surface. He still has the area on the left leg and reopening is on the left buttock. He apparently saw dermatology although I do not have [name] note. According to the patient who is usually fairly well informed they did not have any good ideas. Put him on oral terbinafine which she is been on before. 3/30; using silver collagen to all wounds. Apparently his dermatologist put him on doxycycline and rifampin presumably some culture grew staph. I do not have this result. He remains on terbinafine although I have used terbinafine on him before 4/6; patient has had [name] fairly substantial reopening on the right foot between the first and second toes. He is finished his terbinafine and I believe is on doxycycline and rifampin still as prescribed by dermatology. We have been using silver collagen to all his wounds although the patient reports that he thinks silver alginate does better on the wounds on his buttock. 4/13; the area on his left  lateral calf about the same size but it did not require debridement. ooLeft dorsal foot just proximal to the webspace between the first and second toes is about the same. Still nonviable surface. I note some superficial bronze discoloration of the dorsal part of his foot ooRight dorsal foot just proximal to the first and second toes also looks about the same. I still think there may be the same discoloration I noted above on the left ooLeft buttock wound looks about the same  4/20; left lateral calf appears to be gradually contracting using silver collagen. 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 The left dorsal foot wound is debrided of tightly adherent necrotic debris and really cleans up quite nicely. The right area is worse with expansion. I did not debride this it is now over the base of the second toe ooThe area on his left buttock is smaller no debridement is required using silver collagen 5/4; left calf continues to make good progress. ooHe arrives with erythema around the wounds on his dorsal foot which even extends to the plantar aspect. Very concerning for coexistent infection. He is finished the erythromycin I gave him for possible erythrasma this does not seem to have helped. ooThe area on the left foot is about the same base of the dorsal toes ooIs area on the buttock looks improved on the left 5/11; left calf and left buttock continued to make good progress. Left foot is about the same to slightly improved. ooMajor problem is on the right foot. He has not had an x-ray. Deep tissue culture I did last week showed both Enterobacter and [name]. coli. I did not change the doxycycline I put him on empirically although neither 1 of these were plated to doxycycline. He arrives today with the erythema looking worse on both the dorsal and plantar foot. Macerated skin on the bottom of the foot. he has not been systemically  unwell 5/18-Patient returns at 1 week, left calf wound appears to be making some progress, left buttock wound appears slightly worse than last time, left foot wound looks slightly better, right foot redness is marginally better. X-ray of both feet show no air or evidence of osteomyelitis. Patient is finished his Omnicef and terbinafine. He continues to have macerated skin on the bottom of the left foot as well as right 5/26; left calf wound is better, left buttock wound appears to have multiple small superficial open areas with surrounding macerated skin. X-rays that I did last time showed no evidence of osteomyelitis in either foot. He is finished cefdinir and doxycycline. I do not think that he was on terbinafine. He continues to have [name] large superficial open area on the right foot anterior dorsal and slightly between the first and second toes. I did send him to dermatology 2 months ago or so wondering about whether they would do [name] fungal scraping. I do not believe they did but did do [name] culture. We have been using silver alginate to the toe areas, he has been using antifungals at home topically either ketoconazole or Lotrisone. We are using silver collagen on the left foot, silver alginate on the right, silver collagen on the left lateral leg and silver alginate on the left buttock 6/1; left buttock area is healed. We have the left dorsal foot, left lateral leg and right dorsal foot. We are using silver alginate to the areas on both feet and silver collagen to the area on his left lateral calf 6/8; the left buttock apparently reopened late last week. He is not really sure how this happened. He is tolerating the terbinafine. Using silver alginate to all wounds 6/15; left buttock wound is larger than last week but still superficial. ooCame in the clinic today with [name] report of purulence from the left lateral leg I did not identify any infection ooBoth areas on his dorsal feet appear to be better. He  is tolerating the terbinafine. Using silver alginate to all wounds 6/22; left  buttock is about the same this week, left calf quite [name] bit better. His left foot is about the same however he comes in with erythema and warmth in the right forefoot once again. Culture that I gave him in the beginning of May showed Enterobacter and [name]. coli. I gave him doxycycline and things seem to improve although neither 1 of these organisms was specifically plated. 6/29; left buttock is larger and dry this week. Left lateral calf looks to me to be improved. Left dorsal foot also somewhat improved right foot completely unchanged. The erythema on the right foot is still present. He is completing the Ceftin dinner that I gave him empirically [see discussion above.) 7/6 - All wounds look to be stable and perhaps improved, the left buttock wound is slightly smaller, per patient bleeds easily, completed ceftin, the right foot redness is less, he is on terbinafine 7/13; left buttock wound about the same perhaps slightly narrower. Area on the left lateral leg continues to narrow. Left dorsal foot slightly smaller right foot about the same. We are using silver alginate on the right foot and Hydrofera Blue to the areas on the left. Unna boot on the left 2 layer compression on the right 7/20; left buttock wound absolutely the same. Area on lateral leg continues to get better. Left dorsal foot require debridement as did the right no major change in the 7/27; left buttock wound the same size necrotic debris over the surface. The area on the lateral leg is closed once again. His left foot looks better right foot about the same although there is some involvement now of the posterior first second toe area. He is still on terbinafine which I have given him for [name] month, not certain [name] centimeter major change 06/25/19-All wounds appear to be slightly improved according to report, left buttock wound looks clean, both foot wounds have minimal  to no debris the right dorsal foot has minimal slough. We are using Hydrofera Blue to the left and silver alginate to the right foot and ischial wound. 8/10-Wounds all appear to be around the same, the right forefoot distal part has some redness which was not there before, however the wound looks clean and small. Ischial wound looks about the same with no changes 8/17; his wound on the left lateral calf which was his original chronic venous insufficiency wound remains closed. Since I last saw him the areas on the left dorsal foot right dorsal foot generally appear better but require debridement. The area on his left initial tuberosity appears somewhat larger to me perhaps hyper granulated and bleeds very easily. We have been using Hydrofera Blue to the left dorsal foot and silver alginate to everything else 8/24; left lateral calf remains closed. The areas on his dorsal feet on the webspace of the first and second toes bilaterally both look better. The area on the left buttock which is the pressure ulcer stage II slightly smaller. I change the dressing to Hydrofera Blue to all areas 8/31; left lateral calf remains closed. The area on his dorsal feet bilaterally look better. Using Hydrofera Blue. Still requiring debridement on the left foot. No change in the left buttock pressure ulcers however 9/14; left lateral calf remains closed. Dorsal feet look quite [name] bit better than 2 weeks ago. Flaking dry skin also [name] lot better with the ammonium lactate I gave him 2 weeks ago. The area on the left buttock is improved. He states that his Roho cushion developed [name] leak and  he is getting [name] new one, in the interim he is Keziah, Whitman [name] ([id number]) [id number] Page 27 of 37 offloading this vigorously 9/21; left calf remains closed. Left heel which was [name] possible DTI looks better this week. He had macerated tissue around the left dorsal foot right foot looks satisfactory and improved left  buttock wound. I changed his dressings to his feet to silver alginate bilaterally. Continuing Hydrofera Blue on the left buttock. 9/28 left calf remains closed. Left heel did not develop anything [possible DTI] dry flaking skin on the left dorsal foot. Right foot looks satisfactory. Improved left buttock wound. We are using silver alginate on his feet Hydrofera Blue on the buttock. I have asked him to go back to the Lotrisone on his feet including the wounds and surrounding areas 10/5; left calf remains closed. The areas on the left and right feet about the same. [name] lot of this is epithelialized however debris over the remaining open areas. He is using Lotrisone and silver alginate. The area on the left buttock using Hydrofera Blue 10/26. Patient has been out for 3 weeks secondary to Covid concerns. He tested negative but I think his wife tested positive. He comes in today with the left foot substantially worse, right foot about the same. Even more concerning he states that the area on his left buttock closed over but then reopened and is considerably deeper in one aspect than it was before [stage III wound] 11/2; left foot really about the same as last week. Quarter sized wound on the dorsal foot just proximal to the first second toes. Surrounding erythema with areas of denuded epithelium. This is not really much different looking. Did not look like cellulitis this time however. ooRight foot area about the same.. We have been using silver alginate alginate on his toes ooLeft buttock still substantial irritated skin around the wound which I think looks somewhat better. We have been using Hydrofera Blue here. 11/9; left foot larger than last week and [name] very necrotic surface. Right foot I think is about the same perhaps slightly smaller. Debris around the circumference also addressed. Unfortunately on the left buttock there is been [name] decline. Satellite lesions below the major wound distally and now [name]  an additional one posteriorly we have been using Hydrofera Blue but I think this is [name] pressure issue 11/16; left foot ulcer dorsally again [name] very adherent necrotic surface. Right foot is about the same. Not much change in the pressure ulcer on his left buttock. 11/30; left foot ulcer dorsally basically the same as when I saw him 2 weeks ago. Very adherent fibrinous debris on the wound surface. Patient reports [name] lot of drainage as well. 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 We have been using Iodoflex, patient changed back to alginate because of the drainage. Area on his right dorsal foot really looks benign with [name] healthier surface certainly [name] lot better than on the left. Left buttock wounds all improved using Hydrofera Blue 12/7; left dorsal foot again no improvement. Tightly adherent debris. PCR culture I did last week only showed likely skin contaminant. I have gone ahead and done [name] punch biopsy of this which is about the last thing in terms of investigations I can think to do. He has known venous insufficiency and venous hypertension and this could be the issue here. The area on the right foot is about the same left buttock slightly worse according to our intake nurse  secondary to [facility] Blue sticking to the wound 12/14; biopsy of the left foot that I did last time showed changes that could be related to wound healing/chronic stasis dermatitis phenomenon no neoplasm. We have been using silver alginate to both feet. I change the one on the left today to Sorbact and silver alginate to his other 2 wounds 12/28; the patient arrives with the following problems; ooMajor issue is the dorsal left foot which continues to be [name] larger deeper wound area. Still with [name] completely nonviable surface ooParadoxically the area mirror image on the right on the right dorsal foot appears to be getting better. ooHe had some loss of dry denuded skin from the  lower part of his original wound on the left lateral calf. Some of this area looked [name] little vulnerable and for this reason we put him in wrap that on this side this week ooThe area on his left buttock is larger. He still has the erythematous circular area which I think is [name] combination of pressure, sweat. This does not look like cellulitis or fungal dermatitis 11/26/2019; -Dorsal left foot large open wound with depth. Still debris over the surface. Using Sorbact ooThe area on the dorsal right foot paradoxically has closed over [facility] has [name] reopening on the left ankle laterally at the base of his original wound that extended up into the calf. This appears clean. ooThe left buttock wound is smaller but with very adherent necrotic debris over the surface. We have been using silver alginate here as well The patient had arterial studies done in 2017. He had biphasic waveforms at the dorsalis pedis and posterior tibial bilaterally. ABI in the left was 1.17. Digit waveforms were dampened. He has slight spasticity in the great toes I do not think [name] TBI would be possible 1/11; the patient comes in today with [name] sizable reopening between the first and second toes on the right. This is not exactly in the same location where we have been treating wounds previously. According to our intake nurse this was actually fairly deep but 0.6 cm. The area on the left dorsal foot looks about the same the surface is somewhat cleaner using Sorbact, his MRI is in 2 days. We have not managed yet to get arterial studies. The new reopening on the left lateral calf looks somewhat better using alginate. The left buttock wound is about the same using alginate 1/18; the patient had his ARTERIAL studies which were quite normal. ABI in the right at 1.13 with triphasic/biphasic waveforms on the left ABI 1.06 again with triphasic/biphasic waveforms. It would not have been possible to have done [name] toe brachial index because of spasticity.  We have been using Sorbac to the left foot alginate to the rest of his wounds on the right foot left lateral calf and left buttock 1/25; arrives in clinic with erythema and swelling of the left forefoot worse over the first MTP area. This extends laterally dorsally and but also posteriorly. Still has an area on the left lateral part of the lower part of his calf wound it is eschared and clearly not closed. ooArea on the left buttock still with surrounding irritation and erythema. ooRight foot surface wound dorsally. The area between the right and first and second toes appears better. 2/1; ooThe left foot wound is about the same. Erythema slightly better I gave him [name] week of doxycycline empirically ooRight foot wound is more extensive extending between the toes to the plantar surface ooLeft lateral calf really  no open surface on the inferior part of his original wound however the entire area still looks vulnerable ooAbsolutely no improvement in the left buttock wound required debridement. 2/8; the left foot is about the same. Erythema is slightly improved I gave him clindamycin last week. ooRight foot looks better he is using Lotrimin and silver alginate ooHe has [name] breakdown in the left lateral calf. Denuded epithelium which I have removed ooLeft buttock about the same were using Hydrofera Blue 2/15; left foot is about the same there is less surrounding erythema. Surface still has tightly adherent debris which I have debriding however not making any progress ooRight foot has [name] substantial wound on the medial right second toe between the first and second webspace. ooStill an open area on the left lateral calf distal area. ooButtock wound is about the same 2/22; left foot is about the same less surrounding erythema. Surface has adherent debris. Polymen Ag Right foot area significant wound between the first and second toes. We have been using silver alginate here Left lateral leg  polymen Ag at the base of his original venous insufficiency wound ooLeft buttock some improvement here 3/1; ooRight foot is deteriorating in the first second toe webspace. Larger and more substantial. We have been using silver alginate. ooLeft dorsal foot about the same markedly adherent surface debris using PolyMem Ag ooLeft lateral calf surface debris using PolyMem AG ooLeft buttock is improved again using PolyMem Ag. ooHe is completing his terbinafine. The erythema in the foot seems better. He has been on this for 2 weeks 3/8; no improvement in any wound area in fact he has [name] small open area on the dorsal midfoot which is new this week. He has not gotten his foot x-rays yet 3/15; his x-rays were both negative for osteomyelitis of both feet. No major change in any of his wounds on the extremities however his buttock wounds are better. We have been using polymen on the buttocks, left lower leg. Iodoflex on the left foot and silver alginate on the right 3/22; arrives in clinic today with the 2 major issues are the improvement in the left dorsal foot wound which for once actually looks healthy with [name] nice healthy wound surface without debridement. Using Iodoflex here. Unfortunately on the left lateral calf which is in the distal part of his original wound he came to the clinic here for there was purulent drainage noted some increased breakdown scattered around the original area and [name] small area proximally. We we are using polymen here will change to silver alginate today. His buttock wound on the left is better and I think the area on the right first second toe webspace is also Tolin, Davie [name] ([id number]) [id number] Page 28 of 37 improved 3/29; left dorsal foot looks better. Using Iodoflex. Left ankle culture from deterioration last time grew [name]. coli, Enterobacter and Enterococcus. I will give him [name] course of cefdinir although that will not cover Enterococcus. The  area on the right foot in the webspace of the first and second toe lateral first toe looks better. The area on his buttock is about healed Vascular appointment is on April 21. This is to look at his venous system vis--vis continued breakdown of the wounds on the left including the left lateral leg and left dorsal foot he. He has had previous ablations on this side 4/5; the area between the right first and second toes lateral aspect of the first toe looks better. Dorsal aspect of the left first toe  on the left foot also improved. Unfortunately the left lateral lower leg is larger and there is [name] second satellite wound superiorly. The usual superficial abrasions on the left buttock overall better but certainly not closed 4/12; the area between the right first and second toes is improved. Dorsal aspect of the left foot also slightly smaller with [name] vibrant healthy looking surface. No real change in the left lateral leg and the left buttock wound is healed He has an unaffordable co-pay for Apligraf. Appointment with vein and vascular with regards to the left leg venous part of the circulation is on 4/21 4/19; we continue to see improvement in all wound areas. Although this is minor. He has his vascular appointment on 4/21. The area on the left buttock has not reopened although right in the center of this area the skin looks somewhat threatened 4/26; the left buttock is unfortunately reopened. In general his left dorsal foot has [name] healthy surface and looks somewhat smaller although it was not measured as such. The area between his first and second toe webspace on the right as [name] small wound against the first toe. The patient saw vascular surgery. The real question I was asking was about the small saphenous vein on the left. He has previously ablated left greater saphenous vein. Nothing further was commented on on the left. Right greater saphenous vein without reflux at the saphenofemoral junction or proximal  thigh there was no indication for ablation of the right greater saphenous vein duplex was negative for DVT bilaterally. They did not think there was anything from [name] point of view that could be offered. They ABIs within normal limits 5/3; only small open area on the left buttock. The area on the left lateral leg which was his original venous reflux is now 2 wounds both which look clean. We are using Iodoflex on the left dorsal foot which looks healthy and smaller. He is down to [name] very tiny area between the right first and second toes, using silver alginate 5/10; all of his wounds appear better. We have much better edema control in 4 layer compression on the left. This may be the factor that is allowing the left foot and left lateral calf to heal. He has external compression garments at home 04/14/20-All of his wounds are progressing well, the left forefoot is practically closed, left ischium appears to be about the same, right toe webspace is also smaller. The left lateral leg is about the same, continue using Hydrofera Blue to this, silver alginate to the ischium, Iodoflex to the toe space on the right 6/7; most of his wounds outside of the left buttock are doing well. The area on the left lateral calf and left dorsal foot are smaller. The area on the right foot in between the first and second toe webspace is barely visible although he still says there is some drainage here is the only reason I did not heal this out. ooUnfortunately the area on the left buttock almost looks like he has [name] skin tear from tape. He has open wound and then [name] large flap of skin that we are trying to get adherence over an area just next to the remaining wound 6/21; 2 week follow-up. I believe is been here for nurse visits. Miraculously the area between his first and second toes on the left dorsal foot is closed over. Still open on the right first second web space. The left lateral calf has 2 open areas.  Distally  this is more superficial. The proximal area had [name] little more depth and required debridement of adherent necrotic material. His buttock wound is actually larger we have been using silver alginate here 6/28; the patient's area on the left foot remains closed. Still open wet area between the first and second toes on the right and also extending into the plantar aspect. We have been using silver alginate in this location. He has 2 areas on the left lower leg part of his original long wounds which I think are better. We have been using Hydrofera Blue here. Hydrofera Blue to the left buttock which is stable 7/12; left foot remains closed. Left ankle is closed. May be [name] small area between his right first and second toes the only truly open area is on the left buttock. We have been using Hydrofera Blue here 7/19; patient arrives with marked deterioration especially in the left foot and ankle. We did not put him in [name] compression wrap on the left last week in fact he wore his juxta lite stockings on either side although he does not have an underlying stocking. He has [name] reopening on the left dorsal foot, left lateral ankle and [name] new area on the right dorsal ankle. More worrisome is the degree of erythema on the left foot extending on the lateral foot into the lateral lower leg on the left 7/26; the patient had erythema and drainage from the lateral left ankle last week. Culture of this grew MRSA resistant to doxycycline and clindamycin which are the 2 antibiotics we usually use with this patient who has multiple antibiotic allergies including linezolid, trimethoprim sulfamethoxazole. I had give him an empiric doxycycline and he comes in the area certainly looks somewhat better although it is blotchy in his lower leg. He has not been systemically unwell. He has had areas on the left dorsal foot which is [name] reopening, chronic wounds on the left lateral ankle. Both of these I think are secondary to chronic  venous insufficiency. The area between his first and second toes is closed as far as I can tell. He had [name] new wrap injury on the right dorsal ankle last week. Finally he has an area on the left buttock. We have been using silver alginate to everything except the left buttock we are using Hydrofera Blue 06/30/20-Patient returns at 1 week, has been given [name] sample dose pack of NUZYRA which is [name] tetracycline derivative [omadacycline], patient has completed those, we have been using silver alginate to almost all the wounds except the left ischium where we are using Hydrofera Blue all of them look better 8/16; since I last saw the patient he has been doing well. The area on the left buttock, left lateral ankle and left foot are all closed today. He has completed the [place] I gave him last time and tolerated this well. He still has open areas on the right dorsal ankle and in the right first second toe area which we are using silver alginate. 8/23; we put him in his bilateral external compression stockings last week as he did not have anything open on either leg except for concerning area between the right first and second toe. He comes in today with an area on the left dorsal foot slightly more proximal than the original wound, the left lateral foot but this is actually [name] continuation of the area he had on the left lateral ankle from last time. As well he is opened up on the left buttock again.  8/30; comes in today with things looking [name] lot better. The area on the left lower ankle has closed down as has the left foot but with eschar in both areas. The area on the dorsal right ankle is also epithelialized. Very little remaining of the left buttock wound. We have been using silver alginate on all wound areas 9/13; the area in the first second toe webspace on the right has fully epithelialized. He still has some vulnerable epithelium on the right and the ankle and the dorsal foot. He notes weeping. He is using his  juxta lite stocking. On the left again the left dorsal foot is closed left lateral ankle is closed. We went to the juxta lite stocking here as well. ooStill vulnerable in the left buttock although only 2 small open areas remain here 9/27; 2-week follow-up. We did not look at his left leg but the patient says everything is closed. He is [name] bit disturbed by the amount of edema in his left foot he is using juxta lite stockings but asking about over the toes stockings which would be 30/40, will talk to him next time. According to him there is no open wound on either the left foot or the left ankle/calf He has an open area on the dorsal right calf which I initially point [name] wrap injury. He has superficial remaining wound on the left ischial tuberosity been using silver alginate although he says this sticks to the wound 10/5; we gave him 2-week follow-up but he called yesterday expressing some concerns about his right foot right ankle and the left buttock. He came in early. There is still no open areas on the left leg and that still in his juxta lite stocking 10/11; he only has 1 small area on the left buttock that remains measuring millimeters 1 mm. Still has the same irritated skin in this area. We recommended zinc oxide when this eventually closes and pressure relief is meticulously is he can do this. He still has an area on the dorsal part of his right first through third toes which is [name] bit irritated and still open and on the dorsal ankle near the crease of the ankle. We have been using silver alginate and using his own stocking. He has nothing open on the left leg or foot 10/25; 2-week follow-up. Not nearly as good on the left buttock as I was hoping. For open areas with 5 looking threatened small. He has the erythematous irritated chronic skin in this area. oo1 area on the right dorsal ankle. He reports this area bleeds easily ooRight dorsal foot just proximal to the base of his toes ooWe have  been using silver alginate. 11/8; 2-week follow-up. Left buttock is about the same although I do not think the wounds are in the same location we have been using silver alginate. I have asked him to use zinc oxide on the skin around the wounds. ooHe still has [name] small area on the right dorsal ankle he reports this bleeds easily ooRight dorsal foot just proximal to the base of the toes does not have anything open although the skin is very dry and scaly ooHe has [name] new opening on the nailbed of the left great toe. Nothing on the left ankle 11/29; 3-week follow-up. Left buttock has 2 open areas. And washing of these wounds today started bleeding easily. Suggesting very friable tissue. We have been using silver alginate. Right dorsal ankle which I thought was initially [name] wrap injury we have  been using silver alginate. Nothing open between the toes that I can see. He states the area on the left dorsal toe nailbed healed after the last visit in 2 or 3 days Parfait, Lajuane [name] ([id number]) [id number] Page 29 of 37 12/13; 3-week follow-up. His left buttock now has 3 open areas but the original 2 areas are smaller using polymen here. Surrounding skin looks better. The right dorsal ankle is closed. He has [name] small opening on the right dorsal foot at the level of the third toe. In general the skin looks better here. He is wearing his juxta lite stocking on the left leg says there is nothing open 11/24/2020; 3 weeks follow-up. His left buttock still has the 3 open areas. We have been using polymen but due to lack of response he changed to [facility] area. Surrounding skin is dry erythematous and irritated looking. There is no evidence of infection either bacterial or fungal however there is loss of surface epithelium ooHe still has very dry skin in his foot causing irritation and erythema on the dorsal part of his toes. This is not responded to prolonged courses of antifungal simply  looks dry and irritated 1/24; left buttock area still looks about the same he was unable to find the triad ointment that we had suggested. The area on the right lower leg just above the dorsal ankle has reopened and the areas on the right foot between the first second and second third toes and scaling on the bottom of the foot has been about the same for quite some time now. been using silver alginate to all wound areas 2/7; left buttock wound looked quite good although not much smaller in terms of surface area surrounding skin looks better. Only [name] few dry flaking areas on the right foot in between the first and second toes the skin generally looks better here [ammonium lactate]. Finally the area on the right dorsal ankle is closed 2/21; ooThere is no open area on the right foot even between the right first and second toe. Skin around this area dorsally and plantar aspects look better. ooHe has [name] reopening of the area on the right ankle just above the crease of the ankle dorsally. I continue to think that this is probably friction from spasms may be even this time with his stocking under the compression stockings. ooWounds on his left buttock look about the same there [name] couple of areas that have reopened. He has [name] total square area of loss of epithelialization. This does not look like infection it looks like [name] contact dermatitis but I just cannot determine to what 3/14; there is nothing on the right foot between the first and second toes this was carefully inspected under illumination. Some chronic irritation on the dorsal part of his foot from toes 1-3 at the base. Nothing really open here substantially. Still has an area on the right foot/ankle that is actually larger and hyper granulated. His buttock area on the left is just about closed however he has chronic inflammation with loss of the surface epithelial layer 3/28; 2-week follow-up. In clinic today with [name] new wound on the left anterior mid  tibia. Says this happened about 2 weeks ago. He is not really sure how wonders about the spasticity of his legs at night whether that could have caused this other than that he does not have [name] good idea. He has been using topical antibiotics and silver alginate. The area on his right dorsal ankle seems  somewhat better. ooFinally everything on his left buttock is closed. 4/11; 2-week follow-up. All of his wounds are better except for the area over the ischium and left buttock which have opened up widely again. At least part of this is covered in necrotic fibrinous material another part had rolled nonviable skin. The area on the right ankle, left anterior mid tibia are both [name] lot better. He had no open wounds on either foot including the areas between the first and second toes 4/25; patient presents for 2-week follow-up. He states that the wounds are overall stable. He has no complaints today and states he is using Hydrofera Blue to open wounds. 5/9; have not seen this man in over [name] month. For my memory he has open areas on the left mid tibia and right ankle. T oday he has new open area on the right dorsal foot which we have not had [name] problem with recently. He has the sustained area on the left buttock He is also changed his insurance at the beginning of the year [employer]. We will need prior authorizations for debridement 5/23; patient presents for 2-week follow-up. He has prior authorizations for debridement. He denies any issues in the past 2 weeks with his wound care. He has been using Hydrofera Blue to all the wounds. He does report [name] circular rash to the upper left leg that is new. He denies acute signs of infection. 6/6; 2-week follow-up. The patient has open wounds on the left buttock which are worse than the last time I saw this about [name] month ago. He also has [name] new area to me on the left anterior mid tibia with some surrounding erythema. The area on the dorsal ankle on the right is closed  but I think this will be [name] friction injury every time this area is exposed to either our wraps or his compression stockings caused by unrelenting spasms in this leg. 6/20; 2-week follow-up. oo The patient has open wounds on the left buttock which is about the same. Using [facility] here. - The left mid tibia has [name] static amount of surrounding erythema. Also [name] raised area in the center. We have been using Hydrofera Blue here. ooooFinally he has broken down in his dorsal right foot extending between the first and second toes and going to the base of the first and second toe webspace. I have previously assumed that this was severe venous hypertension 7/5; 2-week follow-up oo The left buttock wound actually looks better. We are using Hydrofera Blue. He has extensive skin irritation around this area and I have not really been able to get that any better. I have tried Lotrisone i.[name]. antifungals and steroids. More most recently we have just been using Coloplast really looks about the same. oo The left mid tibia which was new last week culture to have very resistant staph aureus. Not only methicillin-resistant but doxycycline resistant. The patient has [name] plethora of antibiotic allergies including sulfa, linezolid. I used topical bacitracin on this but he has not started this yet. oo In addition he has an expanding area of erythema with [name] wound on the dorsal right foot. I did [name] deep tissue culture of this area today 7/12; oo Left buttock area actually looks better surrounding skin also looks less irritated. oo Left mid tibia looks about the same. He is using bacitracin this is not worse oo Right dorsal foot looks about the same as well. oo The left first toe also looks about the same  7/19; left buttock wound continues to improve in terms of open areas oo Left mid tibia is still concerning amount of swelling he is using bacitracin oo Dorsal left first toe somewhat smaller oo Right dorsal  foot somewhat smaller 7/25; left buttock wound actually continues to improve oo Left mid tibia area has less swelling. I gave him all my samples of new Nuzyra. This seems to have helped although the wound is still open it. His abrasion closed by here oo Left dorsal great toe really no better. Still [name] very nonviable surface oo Right dorsal foot perhaps some better. oo We have been using bacitracin and silver nitrate to the areas on his lower legs and Hydrofera Blue to the area on the buttock. 8/16 oo Disappointed that his left buttock wound is actually more substantial. Apparently during the last nurse visit these were both very small. He has continued irritation to [name] large area of skin on his buttock. I have never been able to totally explain this although I think it some combination of the way he sits, pressure, moisture. He is not incontinent enough to contribute to this. oo Left dorsal great toe still fibrinous debris on the surface that I have debrided today oo Large area across the dorsal right toes. oo The area on the left anterior mid tibia has less swelling. He completed the [place]. This does not look infected although the tissue is still fried 8/30; 2-week follow-up. oo Left buttock areas not improved. We used Hydrofera Blue on this. Weeping wet with the surrounding erythema that I have not been able to control even with Lotrisone and topical Coloplast oo Left dorsal great toe looks about the same oo More substantial area again at the base of his toes on the left which is new this week. oo Area across the dorsal right toes looks improved oo The left anterior mid tibia looks like it is trying to close 9/13; 2-week follow-up. Using silver alginate on all of his wounds. The left dorsal foot does not look any better. He has the area on the dorsal toe and also the areas at the base of all of his toes 1 through 3. On the right foot he has [name] similar pattern in [name] similar area. He  has the area on his left mid tibia that looks fairly healthy. Finally the area on his left buttock looks somewhat bette 9/20; culture I did of the left foot which was [name] deep tissue culture last time showed [name]. coli he has erythema around this wound. Still [name] completely necrotic surface. His right dorsal foot looks about the same. He has [name] very friable surface to the left anterior mid tibia. Both buttock wounds look better. We have been using silver alginate to all wounds 10/4; he has completed the cephalexin that I gave him last time for the left foot. He is using topical gentamicin under silver alginate silver alginate being applied to all the wounds. Unfortunately all the wounds look irritated on his dorsal right foot dorsal left foot left mid tibia. I wonder if this could be [name] silver Jorgenson, Anthonny [name] ([id number]) [id number] Page 30 of 37 allergy. I am going to change him to [facility] on the lower extremity. The skin on the left buttock and left posterior thigh still flaking dry and irritated. This has continued no matter what I have applied topically to this. He has [name] solitary open wound which by itself does not look too bad however the entire area of  surrounding skin does not change no matter what we have applied here 10/18; the area on the left dorsal foot and right dorsal foot both look better. The area on the right extends into the plantar but not between his toes. We have been using silver alginate. He still has [name] rectangular erythematous area around the area on the left tibia. The wound itself is very small. Finally everything on his left buttock looks [name] little larger the skin is erythematous 11/15; patient comes in with the left dorsal and right dorsal foot distally looking somewhat better. Still nonviable surface on the left foot which required debridement. He still has the area on the left anterior mid tibia although this looks somewhat better. He has [name] new area  on the right lateral lower leg just above the ankle. Finally his left buttock looks terrible with multiple superficial open wounds geometric square shaped area of chronic erythema which I have not been able to sort through 11/29; right dorsal foot and left dorsal foot both look somewhat better. No debridement required. He has the fragile area on the left anterior mid tibia this looks and continues to look somewhat better. Right lateral lower leg just above the ankle we identified last time also looks better. In general the area on his left buttock looks improved. We are using Hydrofera Blue to all wound areas 12/13; right dorsal foot looks better. The area on the right lateral leg is healed. Left dorsal foot has 2 open areas both of which require debridement. The fragile area on the left anterior mid tibial looks better. Smaller area on his buttocks. Were using Hydrofera Blue 1/10; patient comes in with everything looking slightly larger and/or worse. This includes his left buttock, reopening of the left mid tibia, larger areas on the left dorsal foot and what looks to be [name] cellulitis on the right dorsal and plantar foot. We have been using Hydrofera Blue on all wounds. 1/17; right dorsal foot distally looks better today. The left foot has 2 open wounds that are about the same surrounding erythema. 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 No malignancy they did not stain for fungus 1/24; his right distal foot is not closed dry and scaly but the wound looks like it is contracting. I did not debride anything here. Problem on his left dorsal foot with expanding erythema. Apparently there were problems last week getting the Elesa Hacker however it is now available at the [facility] but [name] week later. He is using ketoconazole and Coloplast to the left buttock along  with Hydrofera Blue this actually looks quite [name] bit better today. 1/31; right dorsal foot again is dry and scaly but looking to contract. He has been using [name] moisturizer on his feet at my request but he is not sure which 1. The left dorsal foot wounds look about the same there is erythema here that I marked last week however after course of Nuzyra it certainly is not any better but not any worse either. Finally on his left buttock the skin continues to look better he has the original wound but [name] new substantial area towards the gluteal cleft. Almost like [name] skin tear. I used scissors to remove skin and subcutaneous tissue here silver nitrate and direct pressure 2/7; right dorsal foot. This does not look too much different from last week. Some erythema skin dry and scaly. No  debridement. Left dorsal great toe again still not much improvement. I did remove flaking dry skin and callus from around the edge. Finally on his left buttock. The skin is somewhat better in the periwound. Surface wounds are superficial somewhat better than last week. 01/26/2022: Is [name] little bit of [name] mystery as to why his wounds fail to respond to treatments and actually seem to get worse. This is my first encounter with this patient. He was previously followed by Dr. Dellia Nims. Based upon my review of the chart, it seems that there is [name] little bit of [name] mystery as to why his wounds do not respond as anticipated to the interventions applied and sometimes even get worse. Biopsies have been performed and he was seen by dermatology in [place], but that did not shed any light on the matter. T oday, his gluteal wound is larger, with substantial drainage, rather malodorous. The food wounds are not terrible, but he has [name] lot of callus and scaly skin around these. He is currently getting silver alginate on the gluteal wound, with idodoflex to the feet. He is using lotrisone on his legs for the dry, scaly skin. 02/09/2022: There has really been  no change to any of his wounds. The gluteal wound less drainage and odor, but remains about the same size, the periwound skin remains oddly scaly. His lower extremity wounds also appear roughly the same size. They continue to accumulate [name] small amount of slough. The periwound on his feet and ankle wounds has dry eschar and loose dead skin. We have been using silver alginate on the gluteal wound and Iodoflex on his feet and ankle wounds. T the periwound around his gluteal lesion and Lotrisone on his feet and legs. o 02/23/2022: The right plantar foot wound is closed. The gluteal site looks small but has continued to produce hypertrophic granulation tissue. The foot wounds all look about the same on the dorsal surface of the right foot; on the left, there is only [name] small open area at the site of where his left great toenail would have been. 03/16/2022: The right ankle wound is healed. The right dorsal foot wound is about the same. The left dorsal foot wound is quite [name] bit smaller and the ischial wound is nearly closed. 03/30/2022: The right ankle wound reopened. Both dorsal foot wounds are quite [name] bit smaller. Unfortunately, he appears to have sheared part of his ischial wound open further, perhaps during [name] transfer. 04/13/2022: The right ankle wound has hypertrophic granulation tissue present. The dorsal foot wounds continue to decrease in size. The ischial wound looks about the same today, no better, no worse. 04/27/2022: The right ankle wound has closed. Unfortunately, it looks like some moisture got underneath the dry skin on both of his dorsal feet and these wounds have expanded in size. The ischial wound remains the same with perhaps [name] little bit more slough accumulation than at our previous visit. 05/11/2022: The right ankle wound remains closed. There is [name] left anterior tibial wound that is small has patchy openings with accumulated slough. The dorsum of his right foot appears to be nearly healed with  just [name] small punctate opening. The plantar surface of his right foot has [name] new opening that looks like he may have picked some skin there. His sacral ulcer has hypertrophic granulation tissue but has some slough accumulation. The dorsum of his left foot has multiple open areas in [name] fairly ragged distribution. All of these have slough accumulated within them.  06/01/2022: The right ankle and left anterior tibial wound are both closed. Dorsum of his right foot and left foot both look substantially better with just tiny scattered openings The without any slough accumulation. He has sheared open new areas on his left gluteus and ischium. He says that his wheelchair cushion, which is air-filled, has [name] leak and so it keeps deflating. He is awaiting [name] new cushion. 06/15/2022: The right ankle wound has reopened and the fat layer is exposed. Both dorsal feet have just small openings with just [name] little bit of slough and eschar accumulation. The wound on his left gluteus and ischium is larger again today and has [name] foul odor. 06/29/2022: The right ankle wound has hypertrophic granulation tissue buildup. His dorsal foot wounds both look better with just some eschar on the surface. He has [name] new wound on his left lateral ankle. He is not sure how he acquired it but by appearance, it looks that he hit it on something, potentially his wheelchair or bed. The ischial wound is about the same but is cleaner without any significant purulent drainage or odor. He did not understand what the [facility] call was about and therefore he does not have the topical compounded antibiotic. 07/13/2022: The right ankle wound again has hypertrophic granulation tissue, but less so than at his previous visit. The ischial wound has improved tremendously the use of the [facility] topical antibiotic. No significant change to the left lateral ankle wound; it is fibrotic with slough present. The skin of both of his feet, especially on the right, has [name]  yeasty appearance. 08/10/2022: There is again hypertrophic granulation tissue on the right anterior ankle wound. Both feet are about the same. The left lateral ankle wound is [name] little Hohn, Pharoah [name] ([id number]) [id number] Page 31 of 37 bit desiccated and has some slough buildup. He unfortunately suffered [name] new injury when he was removing his pants and they caught his bandage which caused [name] large skin tear on his left ischium, just distal to the existing wound. The existing left ischial wound, however, is significantly better with just [name] little light slough on the surface. 08/31/2022: The right anterior ankle wound is [name] lot smaller today underneath some eschar. No accumulation of hypertrophic granulation tissue. The left lateral ankle wound has some slough on the surface but is better in terms of moisture balance this week. The left dorsal foot does not really have any openings on it today. The right dorsal foot has some slough and eschar accumulation. His gluteal ulcer is basically closed aside from 2 small areas that are oozing [name] bit. 09/21/2022: The right anterior ankle wound is down to just [name] tiny pinhole. The left lateral ankle wound has accumulated slough again, but moisture balance is good. Left and right dorsal feet both have 2 small openings with some slough in them. The gluteal ulcer, unfortunately has opened up substantially. 10/05/2022: The right anterior ankle wound has reopened and has [name] fair amount of slough on the surface. The left lateral ankle wound has accumulated more slough, as well. He was approved for Apligraf, but the wound is not clean enough yet. There is some slough buildup on the dorsal right foot wound, as well as on his ischium. 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 10/26/2022: The right anterior ankle wound has closed again. The left lateral ankle wound looks quite [name] bit better. It  is ready for  Apligraf but we do not have 1 here for him. His ischium continues to get worse, with the wound larger and deeper. It really seems like this is [name] combination of pressure and friction. He has 2 new wounds, 1 on the plantar aspect of each foot. He says that he had some dry skin there that peeled away. Both are superficial. The dorsum of his left foot does not have any new wound opening. The dorsum of his right foot has [name] bit of slough accumulation. 11/24/2022: The right anterior ankle wound has 2 small open areas, both covered with eschar. The left lateral ankle wound has closed and considerably with just [name] little slough on the surface. The ischium has improved most significantly, having closed in most of the open area with just [name] few small remaining superficial openings. The dorsal foot wounds are small and superficial. The bottoms of his feet are closed. 12/07/2022: The right anterior ankle wound remains open with 2 small areas, again with slough and eschar present. The left lateral ankle wound is down to about half [name] centimeter with just some slough on the surface. His right dorsal foot has opened up more and has both slough and eschar present. The ischium has continued to improve and now just has 2 small open areas that are very clean. 12/21/2022: He has [name] new wound on his right lateral ankle. There is some slough present. The left lateral ankle is quite [name] bit smaller with some slough buildup. His left ischial ulcer is also nearly closed; there is just [name] tear in his skin that seems likely to be secondary to [name] transfer mishap. He has [name] new ulcer in his natal cleft that looks like it may be secondary to moisture. There is some slough in this area as well. The right anterior ankle is nearly closed. The right dorsal foot wound has some slough accumulation 01/04/2023: The anterior right ankle wound is closed, as is his ischial ulcer. The left lateral ankle wound is nearly closed with just [name] little  bit of slough accumulation. The ulcer in his natal cleft is about the same size with slough buildup there, as well. He has [name] new wound on his left lateral leg near the knee from rubbing on his wheelchair. There is slough present, but no signs of infection. The right dorsal foot wound has [name] fairly heavy layer of slough buildup, as well. 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 He has been using his old wheelchair cushion for reasons that are unclear. The ulcer in his natal cleft has healed. The wound on his left lateral leg near the knee is smaller with some slough present. The right dorsal foot wound has accumulated slough again. The right lateral ankle wound is healed. The left lateral ankle wound is smaller again this week. Patient History Information obtained from Patient. Family History Diabetes - Father, Hypertension - Mother, No family history of Cancer, Heart Disease, Hereditary Spherocytosis, Kidney Disease, Lung Disease, Stroke, Thyroid Problems, Tuberculosis. Social History Former smoker, Marital Status - Married, Alcohol Use - Moderate, Drug Use - No History, Caffeine Use - Daily. Medical History Ear/Nose/Mouth/Throat Denies history of Chronic sinus problems/congestion, Middle ear problems Hematologic/Lymphatic Denies history of Anemia, Hemophilia, Human Immunodeficiency Virus, Lymphedema, Sickle Cell Disease Respiratory Patient has history of Sleep Apnea - does not tolerate cpap Denies history of Aspiration, Asthma, Chronic Obstructive Pulmonary Disease (COPD), Pneumothorax, Tuberculosis Cardiovascular Patient has history of Hypertension - lisinopril HCTZ Denies history of Angina, Arrhythmia,  Congestive Heart Failure, Coronary Artery Disease, Deep Vein Thrombosis, Hypotension, Myocardial Infarction, Peripheral Arterial Disease, Peripheral Venous Disease, Phlebitis, Vasculitis Gastrointestinal Denies history of Cirrhosis , Colitis, Crohnoos, Hepatitis [name], Hepatitis  B, Hepatitis C Endocrine Denies history of Type I Diabetes, Type II Diabetes Genitourinary Denies history of End Stage Renal Disease Immunological Denies history of Lupus Erythematosus, Raynaudoos, Scleroderma Integumentary (Skin) Denies history of History of Burn Musculoskeletal Denies history of Gout, Rheumatoid Arthritis, Osteoarthritis, Osteomyelitis Neurologic Patient has history of Paraplegia Denies history of Dementia, Neuropathy, Quadriplegia, Seizure Disorder Oncologic Denies history of Received Chemotherapy, Received Radiation Psychiatric Denies history of Anorexia/bulimia, Confinement Anxiety Hospitalization/Surgery History - cellulitis in leg. - left leg vein ablation. Koenen, SONYA GIM ([id number]) [id number] Page 32 of 37 Objective Constitutional Slightly tachycardic. no acute distress. Vitals Time Taken: 9:13 AM, Height: 70 in, Weight: 216 lbs, BMI: 31, Temperature: 98.2 F, Pulse: 105 bpm, Respiratory Rate: 18 breaths/min, Blood Pressure: 126/79 mmHg. Respiratory Normal work of breathing on room air. 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 He has been using his old wheelchair cushion for reasons that are unclear. The ulcer in his natal cleft has healed. The wound on his left lateral leg near the knee is smaller with some slough present. The right dorsal foot wound has accumulated slough again. The right lateral ankle wound is healed. The left lateral ankle wound is smaller again this week. Integumentary (Hair, Skin) Wound #52 status is Open. Original cause of wound was Gradually Appeared. The date acquired was: 03/27/2021. The wound has been in treatment 94 weeks. The wound is located on the Right,Dorsal Foot. The wound measures 2cm length x 5.1cm width x 0.1cm depth; 8.011cm^2 area and 0.801cm^3 volume. There is Fat Layer (Subcutaneous Tissue) exposed. There is no tunneling or undermining noted. There is [name] medium  amount of serosanguineous drainage noted. The wound margin is distinct with the outline attached to the wound base. There is small (1-33%) red granulation within the wound bed. There is [name] large (67-100%) amount of necrotic tissue within the wound bed including Adherent Slough. The periwound skin appearance had no abnormalities noted for texture. The periwound skin appearance had no abnormalities noted for color. The periwound skin appearance exhibited: Dry/Scaly. Periwound temperature was noted as No Abnormality. Wound #52 status is Open. Original cause of wound was Gradually Appeared. The date acquired was: 03/27/2021. The wound has been in treatment 94 weeks. The wound is located on the Right,Dorsal Foot. The wound measures 2cm length x 5.1cm width x 0.1cm depth; 8.011cm^2 area and 0.801cm^3 volume. There is Fat Layer (Subcutaneous Tissue) exposed. There is no tunneling or undermining noted. There is [name] medium amount of serosanguineous drainage noted. The wound margin is flat and intact. There is small (1-33%) red granulation within the wound bed. There is [name] large (67-100%) amount of necrotic tissue within the wound bed including Adherent Slough. The periwound skin appearance had no abnormalities noted for texture. The periwound skin appearance exhibited: Dry/Scaly, Erythema. The surrounding wound skin color is noted with erythema. Periwound temperature was noted as No Abnormality. Wound #56 status is Open. Original cause of wound was Gradually Appeared. The date acquired was: 07/11/2021. The wound has been in treatment 78 weeks. The wound is located on the Left,Dorsal Foot. The wound measures 0.5cm length x 2.8cm width x 0.1cm depth; 1.1cm^2 area and 0.11cm^3 volume. There is Fat Layer (Subcutaneous Tissue) exposed. There is no tunneling or undermining noted. There is [name] small amount of serous drainage  noted. The wound margin is flat and intact. There is small (1-33%) red granulation within the wound  bed. There is [name] small (1-33%) amount of necrotic tissue within the wound bed including Adherent Slough. The periwound skin appearance had no abnormalities noted for texture. The periwound skin appearance exhibited: Dry/Scaly. The periwound skin appearance did not exhibit: Maceration, Erythema. Periwound temperature was noted as No Abnormality. Wound #67 status is Open. Original cause of wound was Gradually Appeared. The date acquired was: 06/22/2022. The wound has been in treatment 29 weeks. The wound is located on the Left,Lateral Lower Leg. The wound measures 0.9cm length x 0.5cm width x 0.1cm depth; 0.353cm^2 area and 0.035cm^3 volume. There is Fat Layer (Subcutaneous Tissue) exposed. There is no tunneling or undermining noted. There is [name] medium amount of serosanguineous drainage noted. The wound margin is distinct with the outline attached to the wound base. There is large (67-100%) red granulation within the wound bed. There is [name] small (1-33%) amount of necrotic tissue within the wound bed including Adherent Slough. The periwound skin appearance had no abnormalities noted for texture. The periwound skin appearance exhibited: Dry/Scaly, Erythema. The surrounding wound skin color is noted with erythema with red streaks. Periwound temperature was noted as No Abnormality. Wound #71 status is Healed - Epithelialized. Original cause of wound was Gradually Appeared. The date acquired was: 12/06/2022. The wound has been in treatment 4 weeks. The wound is located on the Right,Lateral Lower Leg. The wound measures 0cm length x 0cm width x 0cm depth; 0cm^2 area and 0cm^3 volume. There is no tunneling or undermining noted. There is [name] none present amount of drainage noted. There is no granulation within the wound bed. There is no necrotic tissue within the wound bed. The periwound skin appearance had no abnormalities noted for texture. The periwound skin appearance had no abnormalities noted for moisture. The  periwound skin appearance had no abnormalities noted for color. Periwound temperature was noted as No Abnormality. Wound #72 status is Healed - Epithelialized. Original cause of wound was Pressure Injury. The date acquired was: 12/13/2022. The wound has been in treatment 4 weeks. The wound is located on the Right Gluteus. The wound measures 0cm length x 0cm width x 0cm depth; 0cm^2 area and 0cm^3 volume. There is no tunneling or undermining noted. There is [name] none present amount of drainage noted. There is no granulation within the wound bed. There is no necrotic tissue within the wound bed. The periwound skin appearance had no abnormalities noted for texture. The periwound skin appearance had no abnormalities noted for moisture. The periwound skin appearance had no abnormalities noted for color. Periwound temperature was noted as No Abnormality. Wound #73 status is Open. Original cause of wound was Pressure Injury. The date acquired was: 12/21/2022. The wound has been in treatment 2 weeks. The wound is located on the Left,Proximal,Lateral Lower Leg. The wound measures 1.8cm length x 1.3cm width x 0.1cm depth; 1.838cm^2 area and 0.184cm^3 volume. There is Fat Layer (Subcutaneous Tissue) exposed. There is no tunneling or undermining noted. There is [name] medium amount of serosanguineous drainage noted. The wound margin is distinct with the outline attached to the wound base. There is medium (34-66%) pink granulation within the wound bed. There is [name] medium (34-66%) amount of necrotic tissue within the wound bed including Adherent Slough. The periwound skin appearance had no abnormalities noted for texture. The periwound skin appearance had no abnormalities noted for moisture. The periwound skin appearance exhibited: Erythema. The surrounding wound  skin color is noted with erythema. Erythema is measured at 2.5 cm. Periwound temperature was noted as No Abnormality. Wound #74 status is Open. Original cause of wound  was Pressure Injury. The date acquired was: 01/10/2023. The wound is located on the Left,Posterior Upper Leg. The wound measures 10cm length x 9.5cm width x 0.1cm depth; 74.613cm^2 area and 7.461cm^3 volume. There is Fat Layer (Subcutaneous Tissue) exposed. There is no tunneling or undermining noted. There is [name] large amount of serosanguineous drainage noted. The wound margin is flat and intact. There is medium (34-66%) red granulation within the wound bed. There is [name] medium (34-66%) amount of necrotic tissue within the wound bed including Adherent Slough. The periwound skin appearance had no abnormalities noted for moisture. The periwound skin appearance had no abnormalities noted for color. The periwound skin appearance exhibited: Scarring. Periwound temperature was noted as No Abnormality. 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 There was [name] Excisional Skin/Subcutaneous Tissue Debridement with [name] total area of 10.2 sq cm performed by Fredirick Maudlin, MD. With the following instrument(s): Curette to remove Viable and Non-Viable tissue/material. Material removed includes Subcutaneous Tissue and Slough and. No specimens were taken. [name] time  out was conducted at 09:15, prior to the start of the procedure. [name] Minimum amount of bleeding was controlled with Pressure. The procedure was tolerated well with [name] pain level of Insensate throughout and [name] pain level of Insensate following the procedure. Post Debridement Measurements: 2cm length x 5.1cm width x 0.1cm depth; 0.801cm^3 volume. Character of Wound/Ulcer Post Debridement is improved. Severity of Tissue Post Debridement is: Fat layer exposed. Post procedure Diagnosis Wound #52: Same as Pre-Procedure General Notes: scribed by Baruch Gouty, RN for Dr. Celine Ahr. Wound #67 Pre-procedure diagnosis of Wound #67 is [name] Venous Leg Ulcer located on the Left,Lateral Lower Leg .Severity of Tissue Pre Debridement is: Fat layer exposed. There was [name] Selective/Open Wound Non-Viable Tissue Debridement with [name] total area of 0.45 sq cm performed by Fredirick Maudlin, MD. With the following instrument(s): Curette to remove Non-Viable tissue/material. Material removed includes [facility]. No specimens were taken. [name] time out was conducted at 09:15, prior to the start of the procedure. [name] Minimum amount of bleeding was controlled with Pressure. The procedure was tolerated well with [name] pain level of Insensate throughout and [name] pain level of Insensate following the procedure. Post Debridement Measurements: 0.9cm length x 0.5cm width x 0.1cm depth; 0.035cm^3 volume. Character of Wound/Ulcer Post Debridement is improved. Severity of Tissue Post Debridement is: Fat layer exposed. Post procedure Diagnosis Wound #67: Same as Pre-Procedure General Notes: scribed by Baruch Gouty, RN for Dr. Celine Ahr. Wound #73 Pre-procedure diagnosis of Wound #73 is [name] Pressure Ulcer located on the Left,Proximal,Lateral Lower Leg . There was [name] Selective/Open Wound Non-Viable Tissue Debridement with [name] total area of 2.34 sq cm performed by Fredirick Maudlin,  MD. With the following instrument(s): Curette to remove Non-Viable tissue/material. Material  removed includes [place]. No specimens were taken. [name] time out was conducted at 09:15, prior to the start of the procedure. [name] Minimum amount of bleeding was controlled with Pressure. The procedure was tolerated well with [name] pain level of Insensate throughout and [name] pain level of Insensate following the procedure. Post Debridement Measurements: 1.8cm length x 1.3cm width x 0.1cm depth; 0.184cm^3 volume. Post debridement Stage noted as Category/Stage III. Character of Wound/Ulcer Post Debridement is improved. Post procedure Diagnosis Wound #73: Same as Pre-Procedure General Notes: scribed by Baruch Gouty, RN for Dr. Celine Ahr. Wound #74 Pre-procedure diagnosis of Wound #74 is [name] Pressure Ulcer located on the Left,Posterior Upper Leg . There was [name] Selective/Open Wound Non-Viable Tissue Debridement with [name] total area of 95 sq cm performed by Fredirick Maudlin, MD. With the following instrument(s): Curette to remove Non-Viable tissue/material. Material removed includes [facility]. No specimens were taken. [name] time out was conducted at 09:15, prior to the start of the procedure. [name] Minimum amount of bleeding was controlled with Pressure. The procedure was tolerated well with [name] pain level of Insensate throughout and [name] pain level of Insensate following the procedure. Post Debridement Measurements: 10cm length x 9.5cm width x 0.1cm depth; 7.461cm^3 volume. Post debridement Stage noted as Category/Stage III. Character of Wound/Ulcer Post Debridement is improved. Post procedure Diagnosis Wound #74: Same as Pre-Procedure General Notes: scribed by Baruch Gouty, RN for Dr. Celine Ahr. Plan Follow-up Appointments: Return Appointment in 2 weeks. - Dr. Celine Ahr RM 1 Tuesday 3/12 @ 0815 am Bathing/ Shower/ Hygiene: May shower and wash wound with soap and water. - on days that dressing is changed Edema Control - Lymphedema / SCD / Other: Moisturize legs daily. - using Aquaphor generously to both legs and feet with dressing  changes Compression stocking or Garment 30-40 mm/Hg pressure to: - Juxtalite to both legs daily Off-Loading: Roho cushion for wheelchair - use newer cushion Turn and reposition every 2 hours - lift up with arms in wheelchair every hour during the day WOUND #52: - Foot Wound Laterality: Dorsal, Right Cleanser: Soap and Water Every Other Day/30 Days Discharge Instructions: May shower and wash wound with dial antibacterial soap and water prior to dressing change. Cleanser: Wound Cleanser Every Other Day/30 Days Discharge Instructions: Cleanse the wound with wound cleanser prior to applying [name] clean dressing using gauze sponges, not tissue or cotton balls. Peri-Wound Care: Sween Lotion (Moisturizing lotion) Every Other Day/30 Days Discharge Instructions: Apply Aquaphor moisturizing lotion as directed Prim Dressing: [place] 2x2 (in/in) Every Other Day/30 Days ary Discharge Instructions: Apply to wound bed as instructed Secondary Dressing: Woven Gauze Sponge, Non-Sterile 4x4 in Every Other Day/30 Days Discharge Instructions: Apply over primary dressing as directed. Secured With: [employer], 4.5x3.1 (in/yd) (Generic) Every Other Day/30 Days Discharge Instructions: Secure with Kerlix as directed. Secured With: 77M Medipore H Soft Cloth Surgical T ape, 4 x 10 (in/yd) (Generic) Every Other Day/30 Days Discharge Instructions: Secure with tape as directed. Com pression Stockings: Circaid Juxta Lite Compression Wrap Compression Amount: 30-40 mmHg (right) [place], Dorance [name] ([id number]) [id number] Page 34 of 37 Discharge Instructions: Apply Circaid Juxta Lite Compression Wrap daily as instructed. Apply first thing in the morning, remove at night before bed. WOUND #56: - Foot Wound Laterality: Dorsal, Left Cleanser: Soap and Water Every Other Day/30 Days Discharge Instructions: May shower and wash wound with dial antibacterial soap and water prior to dressing  change. Cleanser: Wound  Cleanser Every Other Day/30 Days Discharge Instructions: Cleanse the wound with wound cleanser prior to applying [name] clean dressing using gauze sponges, not tissue or cotton balls. Peri-Wound Care: Sween Lotion (Moisturizing lotion) Every Other Day/30 Days Discharge Instructions: Apply Aquaphor moisturizing lotion as directed Prim Dressing: [place] 2x2 (in/in) Every Other Day/30 Days ary Discharge Instructions: Apply to wound bed as instructed Secondary Dressing: Woven Gauze Sponge, Non-Sterile 4x4 in Every Other Day/30 Days Discharge Instructions: Apply over primary dressing as directed. Secured With: [employer], 4.5x3.1 (in/yd) (Generic) Every Other Day/30 Days Discharge Instructions: Secure with Kerlix as directed. Secured With: 64M Medipore H Soft Cloth Surgical T ape, 4 x 10 (in/yd) (Generic) Every Other Day/30 Days Discharge Instructions: Secure with tape as directed. WOUND #67: - Lower Leg Wound Laterality: Left, Lateral Cleanser: Soap and Water Every Other Day/30 Days Discharge Instructions: May shower and wash wound with dial antibacterial soap and water prior to dressing change. Cleanser: Wound Cleanser Every Other Day/30 Days Discharge Instructions: Cleanse the wound with wound cleanser prior to applying [name] clean dressing using gauze sponges, not tissue or cotton balls. Peri-Wound Care: Triamcinolone 15 (g) Every Other Day/30 Days Discharge Instructions: Use triamcinolone 15 (g) as directed Peri-Wound Care: Sween Lotion (Moisturizing lotion) Every Other Day/30 Days Discharge Instructions: Apply Aquaphor moisturizing lotion as directed Prim Dressing: [place] 2x2 (in/in) Every Other Day/30 Days ary Discharge Instructions: Apply to wound bed as instructed Secondary Dressing: Woven Gauze Sponge, Non-Sterile 4x4 in Every Other Day/30 Days Discharge Instructions: Apply over primary dressing as directed. Secured With: [employer], 4.5x3.1 (in/yd) (Generic) Every Other Day/30 Days Discharge Instructions: Secure with Kerlix as directed. Secured With: 64M Medipore H Soft Cloth Surgical T ape, 4 x 10 (in/yd) (Generic) Every Other Day/30 Days Discharge Instructions: Secure with tape as directed. WOUND #73: - Lower Leg Wound Laterality: Left, Lateral, Proximal Prim Dressing: [employer] Dressing 2x2 (in/in) Every Other Day/30 Days ary Discharge Instructions: Apply to wound bed as instructed Secondary Dressing: Zetuvit Plus Silicone Border Dressing 4x4 (in/in) Every Other Day/30 Days Discharge Instructions: Apply silicone border over primary dressing as directed. WOUND #74: - Upper Leg Wound Laterality: Left, Posterior Peri-Wound Care: Zinc Oxide Ointment 30g tube 1 x Per Day/30 Days Discharge Instructions: Apply Zinc Oxide to periwound with each dressing change as needed fpr moisture Prim Dressing: Cutimed Sorbact Swab 1 x Per Day/30 Days ary Discharge Instructions: Apply to wound bed as instructed Secondary Dressing: Zetuvit Plus Silicone Border Sacrum Dressing, Sm, 7x7 (in/in) 1 x Per Day/30 Days Discharge Instructions: Apply silicone border over primary dressing as directed. Secured With: 64M Medipore H Soft Cloth Surgical T ape, 4 x 10 (in/yd) 1 x Per Day/30 Days Discharge Instructions: Secure with tape as directed. 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 He has been using his old wheelchair cushion for reasons that are unclear. The ulcer in his natal cleft has healed. The wound on his left lateral leg near the knee is smaller with some slough present. The right dorsal foot wound has accumulated slough again. The right lateral ankle wound is healed. The left lateral ankle wound is smaller again this week. I used [name] curette to debride slough off of the left upper and lower lateral leg wounds, slough and subcutaneous tissue from the right dorsal foot wound, and slough off of the entire reopened  ischial wound. He has done well with Sorbact on the ischial wound in the past and so we will  use this again. We will continue silver alginate to the other open sites. We reemphasized the need for him to use his new wheelchair cushion and strap his legs together to avoid rubbing against the frame with his legs. He will follow-up in 2 weeks. Electronic Signature(s) Signed: 01/18/2023 9:54:10 AM By: Fredirick Maudlin MD FACS Entered By: Fredirick Maudlin on 01/18/2023 09:54:10 -------------------------------------------------------------------------------- HxROS Details Patient Name: Date of Service: [place], [name] [name]. 01/18/2023 8:15 [name] M Medical Record Number: [id number] Patient Account Number: [id number] Date of Birth/Sex: Treating RN: [birth year] (37 y.o. M) Primary Care Provider: [place], [place] Other Clinician: Referring Provider: Treating Provider/Extender: Cornelia Copa [name]: [place] From Patient Ear/Nose/Mouth/Throat Medical History: Negative for: Chronic sinus problems/congestion; Middle ear problems [place], Bryston [name] ([id number]) [id number] Page 35 of 37 Hematologic/Lymphatic Medical History: Negative for: Anemia; Hemophilia; Human Immunodeficiency Virus; Lymphedema; Sickle Cell Disease Respiratory Medical History: Positive for: Sleep Apnea - does not tolerate cpap Negative for: Aspiration; Asthma; Chronic Obstructive Pulmonary Disease (COPD); Pneumothorax; Tuberculosis Cardiovascular Medical History: Positive for: Hypertension - lisinopril HCTZ Negative for: Angina; Arrhythmia; Congestive Heart Failure; Coronary Artery Disease; Deep Vein Thrombosis; Hypotension; Myocardial Infarction; Peripheral Arterial Disease; Peripheral Venous Disease; Phlebitis; Vasculitis Gastrointestinal Medical History: Negative for: Cirrhosis ; Colitis; Crohns; Hepatitis [name]; Hepatitis B; Hepatitis C Endocrine Medical History: Negative  for: Type I Diabetes; Type II Diabetes Genitourinary Medical History: Negative for: End Stage Renal Disease Immunological Medical History: Negative for: Lupus Erythematosus; Raynauds; Scleroderma Integumentary (Skin) Medical History: Negative for: History of Burn Musculoskeletal Medical History: Negative for: Gout; Rheumatoid Arthritis; Osteoarthritis; Osteomyelitis Neurologic Medical History: Positive for: Paraplegia Negative for: Dementia; Neuropathy; Quadriplegia; Seizure Disorder Oncologic Medical History: Negative for: Received Chemotherapy; Received Radiation Psychiatric Medical History: Negative for: Anorexia/bulimia; Confinement Anxiety Immunizations Pneumococcal Vaccine: Received Pneumococcal Vaccination: No Immunization Notes: doesn't remember when last tetanus shot was Implantable Devices No devices added Hospitalization / Surgery History Type of Hospitalization/Surgery cellulitis in leg [place], Arther [name] ([id number]) [id number] Page 36 of 37 left leg vein ablation Family and Social History Cancer: No; Diabetes: Yes - Father; Heart Disease: No; Hereditary Spherocytosis: No; Hypertension: Yes - Mother; Kidney Disease: No; Lung Disease: No; Stroke: No; Thyroid Problems: No; Tuberculosis: No; Former smoker; Marital Status - Married; Alcohol Use: Moderate; Drug Use: No History; Caffeine Use: Daily; Financial Concerns: No; Food, Clothing or Shelter Needs: No; Support System Lacking: No; Transportation Concerns: No Electronic Signature(s) Signed: 01/18/2023 10:11:27 AM By: Fredirick Maudlin MD FACS Entered By: Fredirick Maudlin on 01/18/2023 09:46:19 -------------------------------------------------------------------------------- SuperBill Details Patient Name: Date of Service: [place], [name] [name]. 01/18/2023 Medical Record Number: [id number] Patient Account Number: [id number] Date of Birth/Sex: Treating RN: 1986-09-18 (37 y.o. 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, 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 with other 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, complete 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 L98.492 Non-pressure chronic ulcer of skin of other sites with fat layer exposed Facility Procedures : CPT4 Code: [id number] Description: [id number] - DEB SUBQ TISSUE 20 SQ CM/< ICD-10 Diagnosis Description L97.518 Non-pressure chronic ulcer of other part of  right foot with other specified severi Modifier: ty Quantity: 1 : CPT4 Code: [id number] Description: [id number] - DEBRIDE WOUND 1ST 20 SQ CM OR < ICD-10 Diagnosis Description L89.323 Pressure ulcer of left buttock, stage 3 I87.332 Chronic venous hypertension (idiopathic) with ulcer and inflammation of left lower L97.322 Non-pressure chronic  ulcer of left ankle with fat layer exposed Modifier: extremity Quantity: 1 : CPT4 Code: [id number] Description: [id number] - DEBRIDE WOUND EA ADDL 20 SQ CM ICD-10 Diagnosis Description L89.323 Pressure ulcer of left buttock, stage 3 Modifier: Quantity: 4 Physician Procedures : CPT4 Code Description Modifier [id number] - WC PHYS LEVEL 3 - EST PT 25 ICD-10 Diagnosis Description L89.323 Pressure ulcer of left buttock, 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 with other specified severity L97.322  Non-pressure chronic ulcer of left ankle with fat layer exposed I87.332 Chronic venous hypertension  (idiopathic) with ulcer and inflammation of left lower extremity Quantity: 1 : [id number] - WC PHYS SUBQ TISS 20 SQ CM ICD-10 Diagnosis Description L97.518 Non-pressure chronic ulcer of other part of right foot with other specified severity Quantity: 1 : [id number] - WC PHYS DEBR WO ANESTH 20 SQ CM ICD-10 Diagnosis Description Molinelli, Jossue [name] ([id number]) [id number] L89.323 Pressure ulcer of left buttock, stage 3 I87.332 Chronic venous hypertension (idiopathic) with ulcer and  inflammation of left lower extremity L97.322 Non-pressure chronic ulcer of left ankle with fat layer exposed Quantity: 1 df Page 37 of 37 : [id number] - WC PHYS DEBR WO ANESTH EA ADD 20 CM 4 ICD-10 Diagnosis Description L89.323 Pressure ulcer of left buttock, stage 3 Quantity: Electronic Signature(s) Signed: 01/18/2023 9:55:19 AM By: Fredirick Maudlin MD FACS Entered By: Fredirick Maudlin on 01/18/2023 09:55:18

## 2023-01-29 ENCOUNTER — Other Ambulatory Visit: Payer: Self-pay | Admitting: Behavioral Health

## 2023-01-29 DIAGNOSIS — F341 Dysthymic disorder: Secondary | ICD-10-CM

## 2023-02-01 ENCOUNTER — Encounter (HOSPITAL_BASED_OUTPATIENT_CLINIC_OR_DEPARTMENT_OTHER): Payer: BC Managed Care – PPO | Attending: General Surgery | Admitting: General Surgery

## 2023-02-01 DIAGNOSIS — L97312 Non-pressure chronic ulcer of right ankle with fat layer exposed: Secondary | ICD-10-CM | POA: Insufficient documentation

## 2023-02-01 DIAGNOSIS — G4730 Sleep apnea, unspecified: Secondary | ICD-10-CM | POA: Insufficient documentation

## 2023-02-01 DIAGNOSIS — L89322 Pressure ulcer of left buttock, stage 2: Secondary | ICD-10-CM | POA: Insufficient documentation

## 2023-02-01 DIAGNOSIS — L97511 Non-pressure chronic ulcer of other part of right foot limited to breakdown of skin: Secondary | ICD-10-CM | POA: Insufficient documentation

## 2023-02-01 DIAGNOSIS — L97812 Non-pressure chronic ulcer of other part of right lower leg with fat layer exposed: Secondary | ICD-10-CM | POA: Diagnosis not present

## 2023-02-01 DIAGNOSIS — L89523 Pressure ulcer of left ankle, stage 3: Secondary | ICD-10-CM | POA: Insufficient documentation

## 2023-02-01 DIAGNOSIS — G8220 Paraplegia, unspecified: Secondary | ICD-10-CM | POA: Diagnosis not present

## 2023-02-01 DIAGNOSIS — L97822 Non-pressure chronic ulcer of other part of left lower leg with fat layer exposed: Secondary | ICD-10-CM | POA: Diagnosis not present

## 2023-02-01 DIAGNOSIS — L89224 Pressure ulcer of left hip, stage 4: Secondary | ICD-10-CM | POA: Diagnosis not present

## 2023-02-01 DIAGNOSIS — L97323 Non-pressure chronic ulcer of left ankle with necrosis of muscle: Secondary | ICD-10-CM | POA: Diagnosis not present

## 2023-02-01 DIAGNOSIS — L89893 Pressure ulcer of other site, stage 3: Secondary | ICD-10-CM | POA: Diagnosis not present

## 2023-02-01 DIAGNOSIS — L97512 Non-pressure chronic ulcer of other part of right foot with fat layer exposed: Secondary | ICD-10-CM | POA: Diagnosis not present

## 2023-02-01 DIAGNOSIS — L97821 Non-pressure chronic ulcer of other part of left lower leg limited to breakdown of skin: Secondary | ICD-10-CM | POA: Diagnosis not present

## 2023-02-01 DIAGNOSIS — L97311 Non-pressure chronic ulcer of right ankle limited to breakdown of skin: Secondary | ICD-10-CM | POA: Insufficient documentation

## 2023-02-01 DIAGNOSIS — L97221 Non-pressure chronic ulcer of left calf limited to breakdown of skin: Secondary | ICD-10-CM | POA: Insufficient documentation

## 2023-02-01 DIAGNOSIS — I10 Essential (primary) hypertension: Secondary | ICD-10-CM | POA: Insufficient documentation

## 2023-02-01 DIAGNOSIS — I87333 Chronic venous hypertension (idiopathic) with ulcer and inflammation of bilateral lower extremity: Secondary | ICD-10-CM | POA: Diagnosis not present

## 2023-02-01 DIAGNOSIS — I87332 Chronic venous hypertension (idiopathic) with ulcer and inflammation of left lower extremity: Secondary | ICD-10-CM | POA: Insufficient documentation

## 2023-02-01 DIAGNOSIS — L97322 Non-pressure chronic ulcer of left ankle with fat layer exposed: Secondary | ICD-10-CM | POA: Insufficient documentation

## 2023-02-01 DIAGNOSIS — L97522 Non-pressure chronic ulcer of other part of left foot with fat layer exposed: Secondary | ICD-10-CM | POA: Diagnosis not present

## 2023-02-01 DIAGNOSIS — L89623 Pressure ulcer of left heel, stage 3: Secondary | ICD-10-CM | POA: Diagnosis not present

## 2023-02-01 DIAGNOSIS — L89323 Pressure ulcer of left buttock, stage 3: Secondary | ICD-10-CM | POA: Diagnosis not present

## 2023-02-01 DIAGNOSIS — I872 Venous insufficiency (chronic) (peripheral): Secondary | ICD-10-CM | POA: Diagnosis not present

## 2023-02-15 ENCOUNTER — Encounter (HOSPITAL_BASED_OUTPATIENT_CLINIC_OR_DEPARTMENT_OTHER): Payer: BC Managed Care – PPO | Admitting: General Surgery

## 2023-02-15 DIAGNOSIS — L97221 Non-pressure chronic ulcer of left calf limited to breakdown of skin: Secondary | ICD-10-CM | POA: Diagnosis not present

## 2023-02-15 DIAGNOSIS — L97323 Non-pressure chronic ulcer of left ankle with necrosis of muscle: Secondary | ICD-10-CM | POA: Diagnosis not present

## 2023-02-15 DIAGNOSIS — G6289 Other specified polyneuropathies: Secondary | ICD-10-CM | POA: Diagnosis not present

## 2023-02-15 DIAGNOSIS — L89623 Pressure ulcer of left heel, stage 3: Secondary | ICD-10-CM | POA: Diagnosis not present

## 2023-02-15 DIAGNOSIS — L97821 Non-pressure chronic ulcer of other part of left lower leg limited to breakdown of skin: Secondary | ICD-10-CM | POA: Diagnosis not present

## 2023-02-15 DIAGNOSIS — I87332 Chronic venous hypertension (idiopathic) with ulcer and inflammation of left lower extremity: Secondary | ICD-10-CM | POA: Diagnosis not present

## 2023-02-15 DIAGNOSIS — L97512 Non-pressure chronic ulcer of other part of right foot with fat layer exposed: Secondary | ICD-10-CM | POA: Diagnosis not present

## 2023-02-15 DIAGNOSIS — I872 Venous insufficiency (chronic) (peripheral): Secondary | ICD-10-CM | POA: Diagnosis not present

## 2023-02-15 DIAGNOSIS — L89323 Pressure ulcer of left buttock, stage 3: Secondary | ICD-10-CM | POA: Diagnosis not present

## 2023-02-15 DIAGNOSIS — L97311 Non-pressure chronic ulcer of right ankle limited to breakdown of skin: Secondary | ICD-10-CM | POA: Diagnosis not present

## 2023-02-15 DIAGNOSIS — L89523 Pressure ulcer of left ankle, stage 3: Secondary | ICD-10-CM | POA: Diagnosis not present

## 2023-02-15 DIAGNOSIS — L97511 Non-pressure chronic ulcer of other part of right foot limited to breakdown of skin: Secondary | ICD-10-CM | POA: Diagnosis not present

## 2023-02-15 DIAGNOSIS — I10 Essential (primary) hypertension: Secondary | ICD-10-CM | POA: Diagnosis not present

## 2023-02-15 DIAGNOSIS — L89322 Pressure ulcer of left buttock, stage 2: Secondary | ICD-10-CM | POA: Diagnosis not present

## 2023-02-15 DIAGNOSIS — L97522 Non-pressure chronic ulcer of other part of left foot with fat layer exposed: Secondary | ICD-10-CM | POA: Diagnosis not present

## 2023-02-15 DIAGNOSIS — L97322 Non-pressure chronic ulcer of left ankle with fat layer exposed: Secondary | ICD-10-CM | POA: Diagnosis not present

## 2023-02-15 DIAGNOSIS — L97312 Non-pressure chronic ulcer of right ankle with fat layer exposed: Secondary | ICD-10-CM | POA: Diagnosis not present

## 2023-02-15 DIAGNOSIS — L97812 Non-pressure chronic ulcer of other part of right lower leg with fat layer exposed: Secondary | ICD-10-CM | POA: Diagnosis not present

## 2023-02-15 DIAGNOSIS — G8220 Paraplegia, unspecified: Secondary | ICD-10-CM | POA: Diagnosis not present

## 2023-02-15 DIAGNOSIS — L97822 Non-pressure chronic ulcer of other part of left lower leg with fat layer exposed: Secondary | ICD-10-CM | POA: Diagnosis not present

## 2023-02-15 DIAGNOSIS — G4730 Sleep apnea, unspecified: Secondary | ICD-10-CM | POA: Diagnosis not present

## 2023-02-15 NOTE — Progress Notes (Signed)
Cadman, Jlynn [name] ([id number]) [id number].pdf Page 1 of 17 Visit Report for 02/15/2023 Arrival Information Details Patient Name: Date of Service: [name], [name] 02/15/2023 8:15 [name] Medical Record Number: [id number] Patient Account Number: [id number] Date of Birth/Sex: Treating RN: [birth year] (37 y.o. [name] Primary Care Lois Ostrom: [place], [place] Other Clinician: Referring Shakeira Rhee: Treating Tempest Frankland/Extender: Cornelia Copa [name]: 46 Visit Information History Since Last Visit Added or deleted any medications: No Patient Arrived: Wheel Chair Any new allergies or adverse reactions: No Arrival Time: 08:16 Had [name] fall or experienced change in No Accompanied By: self activities of daily living that may affect Transfer Assistance: None risk of falls: Patient Identification Verified: Yes Signs or symptoms of abuse/neglect since last visito No Secondary Verification Process Completed: Yes Hospitalized since last visit: No Patient Requires Transmission-Based Precautions: No Implantable device outside of the clinic excluding No Patient Has Alerts: Yes cellular tissue based products placed in the center Patient Alerts: R ABI = 1.0 since last visit: L ABI = 1.1 Has Dressing in Place as Prescribed: Yes Has Compression in Place as Prescribed: Yes Pain Present Now: No Electronic Signature(s) Signed: 02/15/2023 5:11:32 PM By: Baruch Gouty RN, BSN Entered By: Baruch Gouty on 02/15/2023 08:17:17 -------------------------------------------------------------------------------- Encounter Discharge Information Details Patient Name: Date of Service: [name], [name] 02/15/2023 8:15 [name] Medical Record Number: [id number] Patient Account Number: [id number] Date of Birth/Sex: Treating RN: 1986-05-07 (37 y.o. [name] Primary Care Lan Mcneill: [place], [place] Other Clinician: Referring Anyra Kaufman: Treating Sadik Piascik/Extender: Cornelia Copa [name]: 75 Encounter Discharge Information Items Post Procedure Vitals Discharge Condition: Stable Temperature (F): 98.1 Ambulatory Status: Wheelchair Pulse (bpm): 123 Discharge Destination: Home Respiratory Rate (breaths/min): 18 Transportation: Private Auto Blood Pressure (mmHg): 134/81 Accompanied By: self Schedule Follow-up Appointment: Yes Clinical Summary of Care: Patient Declined Electronic Signature(s) Signed: 02/15/2023 5:11:32 PM By: Baruch Gouty RN, BSN Entered By: Baruch Gouty on 02/15/2023 09:28:04 -------------------------------------------------------------------------------- Lower Extremity Assessment Details Patient Name: Date of Service: [name], [name] 02/15/2023 8:15 [name] Medical Record Number: [id number] Patient Account Number: [id number] Date of Birth/Sex: Treating RN: 07/08/1986 (37 y.o. [name] Primary Care Jentri Aye: [place], [place] Other Clinician: Referring Alayah Knouff: Treating Negar Sieler/Extender: Graciela Husbands, [name]: 371 Edema Assessment C[Left: KAMAURI, PERUSKI [id number] [[id number].pdf Page 2 of 17] Assessed: [Left: No] [Right: No] Edema: [Left: Yes] [Right: Yes] Calf Left: Right: Point of Measurement: 33 cm From Medial Instep 28.5 cm 31 cm Ankle Left: Right: Point of Measurement: 10 cm From Medial Instep 24.5 cm 24.3 cm Vascular Assessment Pulses: Dorsalis Pedis Palpable: [Left:Yes] [Right:Yes] Electronic Signature(s) Signed: 02/15/2023 5:11:32 PM By: Baruch Gouty RN, BSN Entered By: Baruch Gouty on 02/15/2023 08:23:09 -------------------------------------------------------------------------------- Multi Wound Chart Details Patient Name: Date of Service: [name], [name] 02/15/2023 8:15 [name] Medical Record Number: [id number] Patient Account Number: [id number] Date of Birth/Sex: Treating RN: 12-07-1985 (37 y.o. 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 Primary Etiology: Sleep Apnea, Hypertension, Paraplegia Sleep Apnea, Hypertension, Paraplegia Sleep Apnea, Hypertension, Paraplegia Comorbid History: 03/27/2021 07/11/2021 06/22/2022 Date Acquired: 98 82 33 Weeks  of Treatment: Open Open Open Wound Status: No No No Wound Recurrence: No Yes No Clustered Wound: 1.2x4.3x0.1 2.8x3.2x0.1 0.1x0.1x0.1 Measurements L x W x D (cm) 4.053 7.037 0.008 [name] (cm) : rea 0.405 0.704 0.001 Volume (cm) : -115.00% -13.10% 99.90% % Reduction in [name] rea: -115.40% -13.20% 99.80% % Reduction in Volume: Full Thickness Without Exposed Full Thickness Without Exposed Full Thickness Without Exposed Classification: Support Structures Support Structures Support Structures Medium Medium Small Exudate Amount: Serosanguineous Serosanguineous Serosanguineous Exudate Type: red, brown red, brown red, brown Exudate Color: [name] No No Foul Odor [name] Cleansing: fter [name] [name] [name] Odor Anticipated Due to Product Use: Flat and Intact Flat and Intact Distinct, outline attached Wound Margin: Small (1-33%) Small (1-33%) None Present (0%) Granulation [name] mount: Krass, Pepper [name] ([id number].pdf Page 3 of 17 Red Red [name] Granulation Quality: Large (67-100%) Small (1-33%) None Present (0%) Necrotic Amount: Fat Layer (Subcutaneous Tissue): Yes Fat Layer (Subcutaneous Tissue): Yes Fat Layer (Subcutaneous  Tissue): Yes Exposed Structures: Fascia: No Fascia: No Fascia: No Tendon: No Tendon: No Tendon: No Muscle: No Muscle: No Muscle: No Joint: No Joint: No Joint: No Bone: No Bone: No Bone: No Small (1-33%) Small (1-33%) Large (67-100%) Epithelialization: Debridement - Selective/Open Wound Debridement - Selective/Open Wound Debridement - Selective/Open Wound Debridement: Pre-procedure Verification/Time Out 08:55 08:55 08:55 Taken: [facility] Tissue Debrided: Non-Viable Tissue Non-Viable Tissue Non-Viable Tissue Level: 5.16 8.96 0.25 Debridement [name] (sq cm): rea Curette Curette Curette Instrument: Minimum Minimum Minimum Bleeding: Pressure Pressure Pressure Hemostasis [name] chieved: Insensate Insensate Insensate Procedural Pain: Insensate Insensate Insensate Post Procedural Pain: Procedure was tolerated well Procedure was tolerated well Procedure was tolerated well Debridement Treatment Response: 1.2x4.3x0.1 2.8x3.2x0.1 0.5x0.5x0.1 Post Debridement Measurements L x W x D (cm) 0.405 0.704 0.02 Post Debridement Volume: (cm) No Abnormalities Noted No Abnormalities Noted No Abnormalities Noted Periwound Skin Texture: Dry/Scaly: Yes Dry/Scaly: Yes Dry/Scaly: Yes Periwound Skin Moisture: Maceration: No Erythema: No Erythema: No Erythema: No Periwound Skin Color: [name] [name] [name] Erythema Measurement: No Abnormality No Abnormality No Abnormality Temperature: Debridement Debridement Debridement Procedures Performed: Wound Number: [id number] Photos: Left, Posterior Upper Leg Right, Distal, Anterior Lower Leg Right, Lateral Lower Leg Wound Location: Pressure Injury Gradually Appeared Pressure Injury Wounding Event: Pressure Ulcer Venous Leg Ulcer Venous Leg Ulcer Primary Etiology: Sleep Apnea, Hypertension, Paraplegia Sleep Apnea, Hypertension, Paraplegia Sleep Apnea, Hypertension, Paraplegia Comorbid History: 01/10/2023 02/01/2023 02/08/2023 Date Acquired: 4 2  0 Weeks of Treatment: Open Open Open Wound Status: No No No Wound Recurrence: No No No Clustered Wound: 6.8x6x0.1 0.7x0.4x0.1 0.7x0.9x0.1 Measurements L x W x D (cm) 32.044 0.22 0.495 [name] (cm) : rea 3.204 0.022 0.049 Volume (cm) : 57.10% 41.60% [name] % Reduction in [name] rea: 57.10% 42.10% [name] % Reduction in Volume: Category/Stage III Full Thickness Without Exposed Full Thickness Without Exposed Classification: Support Structures Support Structures Large Medium Medium Exudate Amount: Serosanguineous Serosanguineous Serosanguineous Exudate Type: red, brown red, brown red, brown Exudate Color: Yes No No Foul Odor [name] Cleansing: fter No [name] [name] Odor Anticipated Due to Product Use: Flat and Intact Distinct, outline attached Flat and Intact Wound Margin: Large (67-100%) Medium (34-66%) Large (67-100%) Granulation [name] mount: Red, Friable Red Red Granulation Quality: Small (1-33%) Medium (34-66%) Small (1-33%) Necrotic Amount: Fat Layer (Subcutaneous Tissue): Yes Fat Layer (Subcutaneous Tissue): Yes Fat Layer (Subcutaneous Tissue): Yes Exposed Structures: Fascia: No Fascia: No Tendon: No Tendon: No Muscle: No Muscle: No Joint: No Joint: No Bone: No Bone: No Small (1-33%) Small (1-33%) Small (  1-33%) Epithelialization: [name] Debridement - Selective/Open Wound Debridement - Selective/Open Wound Debridement: Pre-procedure Verification/Time Out [name] 08:55 08:55 Taken: [name] Necrotic/Eschar Slough Tissue Debrided: [name] Non-Viable Tissue Non-Viable Tissue Level: [name] 0.28 0.63 Debridement [name] (sq cm): rea [name] Curette Curette Instrument: [name] Minimum Minimum Bleeding: [name] Pressure Pressure Hemostasis [name] chieved: [name] Insensate Insensate Procedural Pain: Balsam, Jamyson [name] ([id number] Page 4 of 17 [name] Insensate Insensate Post Procedural Pain: [name] Procedure was tolerated well Procedure was tolerated well Debridement Treatment Response: [name]  0.7x0.4x0.1 0.7x0.9x0.1 Post Debridement Measurements L x W x D (cm) [name] 0.022 0.049 Post Debridement Volume: (cm) Scarring: Yes No Abnormalities Noted No Abnormalities Noted Periwound Skin Texture: No Abnormalities Noted Dry/Scaly: Yes Dry/Scaly: Yes Periwound Skin Moisture: No Abnormalities Noted Erythema: Yes No Abnormalities Noted Periwound Skin Color: [name] Measured: 7cm [name] Erythema Measurement: No Abnormality No Abnormality [name] Temperature: [name] Debridement Debridement Procedures Performed: Treatment Notes Wound #52 (Foot) Wound Laterality: Dorsal, Right Cleanser Soap and Water Discharge Instruction: May shower and wash wound with dial antibacterial soap and water prior to dressing change. Wound Cleanser Discharge Instruction: Cleanse the wound with wound cleanser prior to applying [name] clean dressing using gauze sponges, not tissue or cotton balls. Peri-Wound Care Sween Lotion (Moisturizing lotion) Discharge Instruction: Apply Aquaphor moisturizing lotion as directed Topical Primary Dressing Maxorb Extra Ag+ Alginate Dressing, 2x2 (in/in) Discharge Instruction: Apply to wound bed as instructed Secondary Dressing Woven Gauze Sponge, Non-Sterile 4x4 in Discharge Instruction: Apply over primary dressing as directed. Secured With [employer], 4.5x3.1 (in/yd) Discharge Instruction: Secure with Kerlix as directed. 71M Medipore H Soft Cloth Surgical T ape, 4 x 10 (in/yd) Discharge Instruction: Secure with tape as directed. Compression Wrap Compression Stockings Add-Ons Wound #56 (Foot) Wound Laterality: Dorsal, Left Cleanser Soap and Water Discharge Instruction: May shower and wash wound with dial antibacterial soap and water prior to dressing change. Wound Cleanser Discharge Instruction: Cleanse the wound with wound cleanser prior to applying [name] clean dressing using gauze sponges, not tissue or cotton balls. Peri-Wound Care Sween Lotion (Moisturizing  lotion) Discharge Instruction: Apply Aquaphor moisturizing lotion as directed Topical Primary Dressing Maxorb Extra Ag+ Alginate Dressing, 2x2 (in/in) Discharge Instruction: Apply to wound bed as instructed Secondary Dressing Woven Gauze Sponge, Non-Sterile 4x4 in Discharge Instruction: Apply over primary dressing as directed. Secured With [employer], 4.5x3.1 (in/yd) Discharge Instruction: Secure with Kerlix as directed. 71M Medipore H Soft Cloth Surgical T ape, 4 x 10 (in/yd) Discharge Instruction: Secure with tape as directed. Hy, Victory [name] ([id number]) [phone].pdf Page 5 of 17 Compression Wrap Compression Stockings Add-Ons Wound #67 (Lower Leg) Wound Laterality: Left, Lateral Cleanser Soap and Water Discharge Instruction: May shower and wash wound with dial antibacterial soap and water prior to dressing change. Wound Cleanser Discharge Instruction: Cleanse the wound with wound cleanser prior to applying [name] clean dressing using gauze sponges, not tissue or cotton balls. Peri-Wound Care Sween Lotion (Moisturizing lotion) Discharge Instruction: Apply Aquaphor moisturizing lotion as directed Topical Primary Dressing Maxorb Extra Ag+ Alginate Dressing, 2x2 (in/in) Discharge Instruction: Apply to wound bed as instructed Secondary Dressing Woven Gauze Sponge, Non-Sterile 4x4 in Discharge Instruction: Apply over primary dressing as directed. Secured With [employer], 4.5x3.1 (in/yd) Discharge Instruction: Secure with Kerlix as directed. 71M Medipore H Soft Cloth Surgical T ape, 4 x 10 (in/yd) Discharge Instruction: Secure with tape as directed. Compression Wrap Compression Stockings Add-Ons Wound #74 (Upper Leg) Wound Laterality: Left, Posterior Cleanser Vashe 5.8 (oz) Discharge Instruction: Cleanse the wound with Vashe prior  to applying [name] clean dressing using gauze sponges , let sit on wound for 10 minutes Peri-Wound Care Zinc Oxide  Ointment 30g tube Discharge Instruction: Apply Zinc Oxide to periwound with each dressing change as needed fpr moisture Topical Keystone antibiotic compound Primary Dressing Maxorb Extra Ag+ Alginate Dressing, 4x4.75 (in/in) Discharge Instruction: Apply to wound bed as instructed Secondary Dressing Zetuvit Plus Silicone Border Sacrum Dressing, Sm, 7x7 (in/in) Discharge Instruction: Apply silicone border over primary dressing as directed. Secured With 7M Medipore H Soft Cloth Surgical T ape, 4 x 10 (in/yd) Discharge Instruction: Secure with tape as directed. Compression Wrap Compression Stockings Add-Ons Wound #75 (Lower Leg) Wound Laterality: Right, Anterior, Distal Cleanser Reasor, Hakop [name] ([id number] Page 6 of 17 Soap and Water Discharge Instruction: May shower and wash wound with dial antibacterial soap and water prior to dressing change. Wound Cleanser Discharge Instruction: Cleanse the wound with wound cleanser prior to applying [name] clean dressing using gauze sponges, not tissue or cotton balls. Peri-Wound Care Sween Lotion (Moisturizing lotion) Discharge Instruction: Apply Aquaphor moisturizing lotion as directed Topical Primary Dressing Maxorb Extra Ag+ Alginate Dressing, 2x2 (in/in) Discharge Instruction: Apply to wound bed as instructed Secondary Dressing Woven Gauze Sponge, Non-Sterile 4x4 in Discharge Instruction: Apply over primary dressing as directed. Secured With [employer], 4.5x3.1 (in/yd) Discharge Instruction: Secure with Kerlix as directed. 7M Medipore H Soft Cloth Surgical T ape, 4 x 10 (in/yd) Discharge Instruction: Secure with tape as directed. Compression Wrap Compression Stockings Add-Ons Wound #76 (Lower Leg) Wound Laterality: Right, Lateral Cleanser Soap and Water Discharge Instruction: May shower and wash wound with dial antibacterial soap and water prior to dressing change. Wound Cleanser Discharge  Instruction: Cleanse the wound with wound cleanser prior to applying [name] clean dressing using gauze sponges, not tissue or cotton balls. Peri-Wound Care Sween Lotion (Moisturizing lotion) Discharge Instruction: Apply Aquaphor moisturizing lotion as directed Topical Primary Dressing Maxorb Extra Ag+ Alginate Dressing, 2x2 (in/in) Discharge Instruction: Apply to wound bed as instructed Secondary Dressing Woven Gauze Sponge, Non-Sterile 4x4 in Discharge Instruction: Apply over primary dressing as directed. Secured With [employer], 4.5x3.1 (in/yd) Discharge Instruction: Secure with Kerlix as directed. 7M Medipore H Soft Cloth Surgical T ape, 4 x 10 (in/yd) Discharge Instruction: Secure with tape as directed. Compression Wrap Compression Stockings Add-Ons Electronic Signature(s) Signed: 02/15/2023 9:50:34 AM By: Fredirick Maudlin MD FACS Entered By: Fredirick Maudlin on 02/15/2023 09:50:33 Majano, [name] ([id number] Page 7 of 17 -------------------------------------------------------------------------------- Multi-Disciplinary Care Plan Details Patient Name: Date of Service: Jarvie, [name] 02/15/2023 8:15 [name] Medical Record Number: [id number] Patient Account Number: [id number] Date of Birth/Sex: Treating RN: 12/27/1985 (37 y.o. [name] Primary Care Hameed Kolar: [name], [name] Other Clinician: Referring La Dibella: Treating Nikesha Kwasny/Extender: Cornelia Copa [name]: [place] reviewed with physician Active Inactive Pressure Nursing Diagnoses: Knowledge deficit related to management of pressures ulcers Potential for impaired tissue integrity related to pressure, friction, moisture, and shear Goals: Patient will remain free from development of additional pressure ulcers Date Initiated: 06/21/2018 Date Inactivated: 02/12/2019 Target Resolution Date: 02/16/2019 Unmet Reason: pressure  ulcer Goal Status: Unmet reopened on left ischium Patient/caregiver will verbalize risk factors for pressure ulcer development Date Initiated: 06/21/2018 Date Inactivated: 05/28/2019 Target Resolution Date: 06/01/2019 Goal Status: Met Patient/caregiver will verbalize understanding of pressure ulcer management Date Initiated: 06/01/2022 Target Resolution Date: 03/15/2023 Goal Status: Active Interventions: Assess: immobility, friction, shearing, incontinence upon admission and as needed Assess offloading mechanisms upon admission and  as needed Assess potential for pressure ulcer upon admission and as needed Treatment Activities: Pressure reduction/relief device ordered : 12/05/2017 Notes: Wound/Skin Impairment Nursing Diagnoses: Impaired tissue integrity Knowledge deficit related to ulceration/compromised skin integrity Goals: Patient/caregiver will verbalize understanding of skin care regimen Date Initiated: 06/01/2022 Target Resolution Date: 03/15/2023 Goal Status: Active Ulcer/skin breakdown will have [name] volume reduction of 30% by week 4 Date Initiated: 06/01/2022 Date Inactivated: 06/29/2022 Target Resolution Date: 06/29/2022 Unmet Reason: complex wounds, Goal Status: Unmet infection Interventions: Assess patient/caregiver ability to obtain necessary supplies Assess ulceration(s) every visit Provide education on ulcer and skin care Notes: 02/02/21: Complex Care, ongoing. Electronic Signature(s) Signed: 02/15/2023 5:11:32 PM By: Baruch Gouty RN, BSN Entered By: Baruch Gouty on 02/15/2023 08:47:28 -------------------------------------------------------------------------------- Pain Assessment Details Patient Name: Date of Service: [name], [name] 02/15/2023 8:15 [name] Fetterman, Nevaeh [name] ([id number] Page 8 of 17 Medical Record Number: [id number] Patient Account Number: [id number] Date of Birth/Sex: Treating RN: [birth year] (37 y.o. [name] Primary Care Harmoni Lucus: [place], [place] Other Clinician: Referring Quenna Doepke: Treating Jaryn Hocutt/Extender: Cornelia Copa [name]: 371 Active Problems Location of Pain Severity and Description of Pain Patient Has Paino No Site Locations Rate the pain. Current Pain Level: 0 Pain Management and Medication Current Pain Management: Electronic Signature(s) Signed: 02/15/2023 5:11:32 PM By: Baruch Gouty RN, BSN Entered By: Baruch Gouty on 02/15/2023 08:17:53 -------------------------------------------------------------------------------- Patient/Caregiver Education Details Patient Name: Date of Service: [name], [name] 3/26/2024andnbsp8:15 [name] Medical Record Number: [id number] Patient Account Number: [id number] Date of Birth/Gender: Treating RN: Jun 20, 1986 (37 y.o. [name] Primary Care Physician: Janine Limbo Other Clinician: Referring Physician: Treating Physician/Extender: Cornelia Copa [name]: 5 Education Assessment Education Provided To: Patient Education Topics Provided Pressure: Methods: Explain/Verbal Responses: Reinforcements needed, State content correctly Venous: Methods: Explain/Verbal Responses: Reinforcements needed, State content correctly Wound/Skin Impairment: Methods: Explain/Verbal Responses: Reinforcements needed, State content correctly Electronic Signature(s) Signed: 02/15/2023 5:11:32 PM By: Baruch Gouty RN, BSN Entered By: Baruch Gouty on 02/15/2023 08:48:04 Bryner, [name] ([id number] Page 9 of 17 -------------------------------------------------------------------------------- Wound Assessment Details Patient Name: Date of Service: [name], [name] 02/15/2023 8:15 [name] Medical Record Number: [id number] Patient Account Number: [id number] Date of Birth/Sex: Treating RN: 04/14/1986 (37 y.o. [name] Primary Care Zayvion Stailey: [name],  [name] Other Clinician: Referring Jkwon Treptow: Treating Jasmina Gendron/Extender: Graciela Husbands, [name]: 371 Wound Status Wound Number: 52 Primary Etiology: Venous Leg Ulcer Wound Location: Right, Dorsal Foot Wound Status: Open Wounding Event: Gradually Appeared Comorbid History: Sleep Apnea, Hypertension, Paraplegia Date Acquired: 03/27/2021 Weeks Of Treatment: 98 Clustered Wound: No Photos Wound Measurements Length: (cm) 1.2 Width: (cm) 4.3 Depth: (cm) 0.1 Area: (cm) 4.053 Volume: (cm) 0.405 % Reduction in Area: -115% % Reduction in Volume: -115.4% Epithelialization: Small (1-33%) Tunneling: No Undermining: No Wound Description Classification: Full Thickness Without Exposed Su Wound Margin: Flat and Intact Exudate Amount: Medium Exudate Type: Serosanguineous Exudate Color: red, brown pport Structures Wound Bed Granulation Amount: Small (1-33%) Exposed Structure Granulation Quality: Red Fascia Exposed: No Necrotic Amount: Large (67-100%) Fat Layer (Subcutaneous Tissue) Exposed: Yes Necrotic Quality: Adherent Slough Tendon Exposed: No Muscle Exposed: No Joint Exposed: No Bone Exposed: No Periwound Skin Texture Texture Color No Abnormalities Noted: Yes No Abnormalities Noted: Yes Moisture Temperature / Pain No Abnormalities Noted: No Temperature: No Abnormality Dry / Scaly: Yes Treatment Notes Wound #52 (Foot) Wound Laterality: Dorsal, Right Cleanser Soap and Water Discharge Instruction: May shower and wash wound with  dial antibacterial soap and water prior to dressing change. Wound Cleanser Discharge Instruction: Cleanse the wound with wound cleanser prior to applying [name] clean dressing using gauze sponges, not tissue or cotton balls. Kocourek, Kolden [name] ([id number]) [phone].pdf Page 10 of 17 Peri-Wound Care Sween Lotion (Moisturizing lotion) Discharge Instruction: Apply Aquaphor moisturizing lotion as  directed Topical Primary Dressing Maxorb Extra Ag+ Alginate Dressing, 2x2 (in/in) Discharge Instruction: Apply to wound bed as instructed Secondary Dressing Woven Gauze Sponge, Non-Sterile 4x4 in Discharge Instruction: Apply over primary dressing as directed. Secured With [employer], 4.5x3.1 (in/yd) Discharge Instruction: Secure with Kerlix as directed. 841M Medipore H Soft Cloth Surgical T ape, 4 x 10 (in/yd) Discharge Instruction: Secure with tape as directed. Compression Wrap Compression Stockings Add-Ons Electronic Signature(s) Signed: 02/15/2023 5:11:32 PM By: Baruch Gouty RN, BSN Entered By: Baruch Gouty on 02/15/2023 08:50:22 -------------------------------------------------------------------------------- Wound Assessment Details Patient Name: Date of Service: [name], [name] 02/15/2023 8:15 [name] Medical Record Number: [id number] Patient Account Number: [id number] Date of Birth/Sex: Treating RN: [birth year] 1986 (37 y.o. [name] Primary Care Elver Stadler: [place], [place] Other Clinician: Referring Sapphira Harjo: Treating Chaze Hruska/Extender: Graciela Husbands, [name]: 371 Wound Status Wound Number: 56 Primary Etiology: Neuropathic Ulcer-Non Diabetic Wound Location: Left, Dorsal Foot Wound Status: Open Wounding Event: Gradually Appeared Comorbid History: Sleep Apnea, Hypertension, Paraplegia Date Acquired: 07/11/2021 Weeks Of Treatment: 82 Clustered Wound: Yes Photos Wound Measurements Length: (cm) 2.8 Width: (cm) 3.2 Depth: (cm) 0.1 Area: (cm) 7.037 Volume: (cm) 0.704 % Reduction in Area: -13.1% % Reduction in Volume: -13.2% Epithelialization: Small (1-33%) Tunneling: No Undermining: No Wound Description Classification: Full Thickness Without Exposed Support Structures Buchholz, Buster [name] ([id number]) Wound Margin: Flat and Intact Exudate Amount: Medium Exudate Type: Serosanguineous Exudate Color: red, brown Foul Odor After  Cleansing: No [phone].pdf Page 11 of 17 Slough/Fibrino Yes Wound Bed Granulation Amount: Small (1-33%) Exposed Structure Granulation Quality: Red Fascia Exposed: No Necrotic Amount: Small (1-33%) Fat Layer (Subcutaneous Tissue) Exposed: Yes Necrotic Quality: Adherent Slough Tendon Exposed: No Muscle Exposed: No Joint Exposed: No Bone Exposed: No Periwound Skin Texture Texture Color No Abnormalities Noted: Yes No Abnormalities Noted: No Erythema: No Moisture No Abnormalities Noted: No Temperature / Pain Dry / Scaly: Yes Temperature: No Abnormality Maceration: No Treatment Notes Wound #56 (Foot) Wound Laterality: Dorsal, Left Cleanser Soap and Water Discharge Instruction: May shower and wash wound with dial antibacterial soap and water prior to dressing change. Wound Cleanser Discharge Instruction: Cleanse the wound with wound cleanser prior to applying [name] clean dressing using gauze sponges, not tissue or cotton balls. Peri-Wound Care Sween Lotion (Moisturizing lotion) Discharge Instruction: Apply Aquaphor moisturizing lotion as directed Topical Primary Dressing Maxorb Extra Ag+ Alginate Dressing, 2x2 (in/in) Discharge Instruction: Apply to wound bed as instructed Secondary Dressing Woven Gauze Sponge, Non-Sterile 4x4 in Discharge Instruction: Apply over primary dressing as directed. Secured With [employer], 4.5x3.1 (in/yd) Discharge Instruction: Secure with Kerlix as directed. 841M Medipore H Soft Cloth Surgical T ape, 4 x 10 (in/yd) Discharge Instruction: Secure with tape as directed. Compression Wrap Compression Stockings Add-Ons Electronic Signature(s) Signed: 02/15/2023 5:11:32 PM By: Baruch Gouty RN, BSN Entered By: Baruch Gouty on 02/15/2023 08:50:56 -------------------------------------------------------------------------------- Wound Assessment Details Patient Name: Date of Service: [name], [name] 02/15/2023 8:15 [name]  M Medical Record Number: [id number] Patient Account Number: [id number] Date of Birth/Sex: Treating RN: 1986/06/12 (37 y.o. [name] Primary Care Clance Baquero: [place], [place] Other Clinician: Referring Priyal Musquiz: Treating Cyana Shook/Extender: Graciela Husbands, [name] Weeks in  Treatment: [place] ([id number]) [phone].pdf Page 12 of 17 Wound Status Wound Number: 67 Primary Etiology: Venous Leg Ulcer Wound Location: Left, Lateral Lower Leg Wound Status: Open Wounding Event: Gradually Appeared Comorbid History: Sleep Apnea, Hypertension, Paraplegia Date Acquired: 06/22/2022 Weeks Of Treatment: 33 Clustered Wound: No Photos Wound Measurements Length: (cm) 0.1 Width: (cm) 0.1 Depth: (cm) 0.1 Area: (cm) 0.008 Volume: (cm) 0.001 % Reduction in Area: 99.9% % Reduction in Volume: 99.8% Epithelialization: Large (67-100%) Tunneling: No Undermining: No Wound Description Classification: Full Thickness Without Exposed Support Structures Wound Margin: Distinct, outline attached Exudate Amount: Small Exudate Type: Serosanguineous Exudate Color: red, brown Foul Odor After Cleansing: No Slough/Fibrino No Wound Bed Granulation Amount: None Present (0%) Exposed Structure Necrotic Amount: None Present (0%) Fascia Exposed: No Fat Layer (Subcutaneous Tissue) Exposed: Yes Tendon Exposed: No Muscle Exposed: No Joint Exposed: No Bone Exposed: No Periwound Skin Texture Texture Color No Abnormalities Noted: Yes No Abnormalities Noted: No Erythema: No Moisture No Abnormalities Noted: No Temperature / Pain Dry / Scaly: Yes Temperature: No Abnormality Treatment Notes Wound #67 (Lower Leg) Wound Laterality: Left, Lateral Cleanser Soap and Water Discharge Instruction: May shower and wash wound with dial antibacterial soap and water prior to dressing change. Wound Cleanser Discharge Instruction: Cleanse the wound with wound cleanser prior to  applying [name] clean dressing using gauze sponges, not tissue or cotton balls. Peri-Wound Care Sween Lotion (Moisturizing lotion) Discharge Instruction: Apply Aquaphor moisturizing lotion as directed Topical Primary Dressing Maxorb Extra Ag+ Alginate Dressing, 2x2 (in/in) Discharge Instruction: Apply to wound bed as instructed Geddes, Keagan [name] ([id number].pdf Page 13 of 17 Secondary Dressing Woven Gauze Sponge, Non-Sterile 4x4 in Discharge Instruction: Apply over primary dressing as directed. Secured With [employer], 4.5x3.1 (in/yd) Discharge Instruction: Secure with Kerlix as directed. 60M Medipore H Soft Cloth Surgical T ape, 4 x 10 (in/yd) Discharge Instruction: Secure with tape as directed. Compression Wrap Compression Stockings Add-Ons Electronic Signature(s) Signed: 02/15/2023 5:11:32 PM By: Baruch Gouty RN, BSN Entered By: Baruch Gouty on 02/15/2023 08:51:33 -------------------------------------------------------------------------------- Wound Assessment Details Patient Name: Date of Service: [name], [name] 02/15/2023 8:15 [name] Medical Record Number: [id number] Patient Account Number: [id number] Date of Birth/Sex: Treating RN: 1986/05/30 (37 y.o. [name] Primary Care Jhovany Weidinger: [place], [place] Other Clinician: Referring Lucillie Kiesel: Treating Jatavion Peaster/Extender: Graciela Husbands, [name]: 371 Wound Status Wound Number: 74 Primary Etiology: Pressure Ulcer Wound Location: Left, Posterior Upper Leg Wound Status: Open Wounding Event: Pressure Injury Comorbid History: Sleep Apnea, Hypertension, Paraplegia Date Acquired: 01/10/2023 Weeks Of Treatment: 4 Clustered Wound: No Photos Wound Measurements Length: (cm) 6.8 Width: (cm) 6 Depth: (cm) 0.1 Area: (cm) 32.044 Volume: (cm) 3.204 % Reduction in Area: 57.1% % Reduction in Volume: 57.1% Epithelialization: Small (1-33%) Tunneling: No Undermining:  No Wound Description Classification: Category/Stage III Wound Margin: Flat and Intact Exudate Amount: Large Exudate Type: Serosanguineous Exudate Color: red, brown Foul Odor After Cleansing: Yes Due to Product Use: No Slough/Fibrino Yes Wound Bed Granulation Amount: Large (67-100%) Exposed Structure Granulation Quality: Red, Friable Fascia Exposed: No Necrotic Amount: Small (1-33%) Fat Layer (Subcutaneous Tissue) Exposed: Yes Necrotic Quality: Adherent Slough Tendon Exposed: No Lupu, Aragorn [name] ([id number].pdf Page 14 of 17 Muscle Exposed: No Joint Exposed: No Bone Exposed: No Periwound Skin Texture Texture Color No Abnormalities Noted: No No Abnormalities Noted: Yes Scarring: Yes Temperature / Pain Temperature: No Abnormality Moisture No Abnormalities Noted: Yes Treatment Notes Wound #74 (Upper Leg) Wound Laterality: Left, Posterior Cleanser Vashe 5.8 (  oz) Discharge Instruction: Cleanse the wound with Vashe prior to applying [name] clean dressing using gauze sponges , let sit on wound for 10 minutes Peri-Wound Care Zinc Oxide Ointment 30g tube Discharge Instruction: Apply Zinc Oxide to periwound with each dressing change as needed fpr moisture Topical Keystone antibiotic compound Primary Dressing Maxorb Extra Ag+ Alginate Dressing, 4x4.75 (in/in) Discharge Instruction: Apply to wound bed as instructed Secondary Dressing Zetuvit Plus Silicone Border Sacrum Dressing, Sm, 7x7 (in/in) Discharge Instruction: Apply silicone border over primary dressing as directed. Secured With 66M Medipore H Soft Cloth Surgical T ape, 4 x 10 (in/yd) Discharge Instruction: Secure with tape as directed. Compression Wrap Compression Stockings Add-Ons Electronic Signature(s) Signed: 02/15/2023 5:11:32 PM By: Baruch Gouty RN, BSN Entered By: Baruch Gouty on 02/15/2023  08:52:15 -------------------------------------------------------------------------------- Wound Assessment Details Patient Name: Date of Service: [name], [name] 02/15/2023 8:15 [name] Medical Record Number: [id number] Patient Account Number: [id number] Date of Birth/Sex: Treating RN: 1986-05-29 (37 y.o. [name] Primary Care Ladona Rosten: [place], [place] Other Clinician: Referring Violeta Lecount: Treating Dearia Wilmouth/Extender: Graciela Husbands, [name]: 371 Wound Status Wound Number: 75 Primary Etiology: Venous Leg Ulcer Wound Location: Right, Distal, Anterior Lower Leg Wound Status: Open Wounding Event: Gradually Appeared Comorbid History: Sleep Apnea, Hypertension, Paraplegia Date Acquired: 02/01/2023 Weeks Of Treatment: 2 Clustered Wound: No Photos [name], [name] ([id number].pdf Page 15 of 17 Wound Measurements Length: (cm) 0.7 Width: (cm) 0.4 Depth: (cm) 0.1 Area: (cm) 0.22 Volume: (cm) 0.022 % Reduction in Area: 41.6% % Reduction in Volume: 42.1% Epithelialization: Small (1-33%) Tunneling: No Undermining: No Wound Description Classification: Full Thickness Without Exposed Support Structures Wound Margin: Distinct, outline attached Exudate Amount: Medium Exudate Type: Serosanguineous Exudate Color: red, brown Foul Odor After Cleansing: No Slough/Fibrino Yes Wound Bed Granulation Amount: Medium (34-66%) Exposed Structure Granulation Quality: Red Fat Layer (Subcutaneous Tissue) Exposed: Yes Necrotic Amount: Medium (34-66%) Necrotic Quality: Adherent Slough Periwound Skin Texture Texture Color No Abnormalities Noted: Yes No Abnormalities Noted: No Erythema: Yes Moisture Erythema Measurement: Measured No Abnormalities Noted: No 7 cm Dry / Scaly: Yes Temperature / Pain Temperature: No Abnormality Treatment Notes Wound #75 (Lower Leg) Wound Laterality: Right, Anterior, Distal Cleanser Soap and  Water Discharge Instruction: May shower and wash wound with dial antibacterial soap and water prior to dressing change. Wound Cleanser Discharge Instruction: Cleanse the wound with wound cleanser prior to applying [name] clean dressing using gauze sponges, not tissue or cotton balls. Peri-Wound Care Sween Lotion (Moisturizing lotion) Discharge Instruction: Apply Aquaphor moisturizing lotion as directed Topical Primary Dressing Maxorb Extra Ag+ Alginate Dressing, 2x2 (in/in) Discharge Instruction: Apply to wound bed as instructed Secondary Dressing Woven Gauze Sponge, Non-Sterile 4x4 in Discharge Instruction: Apply over primary dressing as directed. Secured With [employer], 4.5x3.1 (in/yd) Discharge Instruction: Secure with Kerlix as directed. 66M Medipore H Soft Cloth Surgical T ape, 4 x 10 (in/yd) Discharge Instruction: Secure with tape as directed. Compression Wrap [place], [place] [name] ([id number]) [phone].pdf Page 16 of 17 Compression Stockings Add-Ons Electronic Signature(s) Signed: 02/15/2023 5:11:32 PM By: Baruch Gouty RN, BSN Entered By: Baruch Gouty on 02/15/2023 08:52:55 -------------------------------------------------------------------------------- Wound Assessment Details Patient Name: Date of Service: [place], [name] 02/15/2023 8:15 [name] Medical Record Number: [id number] Patient Account Number: [id number] Date of Birth/Sex: Treating RN: 1986/10/03 (37 y.o. [name] Primary Care Juniper Cobey: [name], [name] Other Clinician: Referring Merry Pond: Treating Herberto Ledwell/Extender: Graciela Husbands, [name]: 371 Wound Status Wound Number: 76 Primary Etiology: Venous Leg Ulcer Wound  Location: Right, Lateral Lower Leg Wound Status: Open Wounding Event: Pressure Injury Comorbid History: Sleep Apnea, Hypertension, Paraplegia Date Acquired: 02/08/2023 Weeks Of Treatment: 0 Clustered Wound: No Photos Wound  Measurements Length: (cm) 0.7 Width: (cm) 0.9 Depth: (cm) 0.1 Area: (cm) 0.495 Volume: (cm) 0.049 % Reduction in Area: % Reduction in Volume: Epithelialization: Small (1-33%) Tunneling: No Undermining: No Wound Description Classification: Full Thickness Without Exposed Support Structures Wound Margin: Flat and Intact Exudate Amount: Medium Exudate Type: Serosanguineous Exudate Color: red, brown Foul Odor After Cleansing: No Slough/Fibrino No Wound Bed Granulation Amount: Large (67-100%) Exposed Structure Granulation Quality: Red Fascia Exposed: No Necrotic Amount: Small (1-33%) Fat Layer (Subcutaneous Tissue) Exposed: Yes Necrotic Quality: Adherent Slough Tendon Exposed: No Muscle Exposed: No Joint Exposed: No Bone Exposed: No Periwound Skin Texture Texture Color No Abnormalities Noted: Yes No Abnormalities Noted: Yes Moisture No Abnormalities Noted: No Dry / Scaly: Yes [place], Haydn [name] ([id number].pdf Page 17 of 17 Treatment Notes Wound #76 (Lower Leg) Wound Laterality: Right, Lateral Cleanser Soap and Water Discharge Instruction: May shower and wash wound with dial antibacterial soap and water prior to dressing change. Wound Cleanser Discharge Instruction: Cleanse the wound with wound cleanser prior to applying [name] clean dressing using gauze sponges, not tissue or cotton balls. Peri-Wound Care Sween Lotion (Moisturizing lotion) Discharge Instruction: Apply Aquaphor moisturizing lotion as directed Topical Primary Dressing Maxorb Extra Ag+ Alginate Dressing, 2x2 (in/in) Discharge Instruction: Apply to wound bed as instructed Secondary Dressing Woven Gauze Sponge, Non-Sterile 4x4 in Discharge Instruction: Apply over primary dressing as directed. Secured With [employer], 4.5x3.1 (in/yd) Discharge Instruction: Secure with Kerlix as directed. 44M Medipore H Soft Cloth Surgical T ape, 4 x 10 (in/yd) Discharge Instruction:  Secure with tape as directed. Compression Wrap Compression Stockings Add-Ons Electronic Signature(s) Signed: 02/15/2023 5:11:32 PM By: Baruch Gouty RN, BSN Entered By: Baruch Gouty on 02/15/2023 08:53:38 -------------------------------------------------------------------------------- Vitals Details Patient Name: Date of Service: [place], [name] 02/15/2023 8:15 [name] Medical Record Number: [id number] Patient Account Number: [id number] Date of Birth/Sex: Treating RN: January 01, 1986 (37 y.o. [name] Primary Care [employer]: [place], [place] Other Clinician: Referring Johnathyn Viscomi: Treating Owens Hara/Extender: Graciela Husbands, [name]: 371 Vital Signs Time Taken: 08:17 Temperature (F): 98.1 Height (in): 70 Pulse (bpm): 123 Weight (lbs): 216 Respiratory Rate (breaths/min): 18 Body Mass Index (BMI): 31 Blood Pressure (mmHg): 134/81 Reference Range: 80 - 120 mg / dl Electronic Signature(s) Signed: 02/15/2023 5:11:32 PM By: Baruch Gouty RN, BSN Entered By: Baruch Gouty on 02/15/2023 08:17:41

## 2023-02-16 NOTE — Progress Notes (Signed)
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 toe 12/02/16; patient's right leg is better and there is no open wound here. His left anterior lateral leg wound continues to have [name] healthy-looking surface. Small wound at the base of the left second toe however there is erythema in the left forefoot which is worrisome 12/16/16; is no open wounds on his right leg. We took measurements for stockings. His left anterior lateral leg wound continues to have [name] healthy-looking surface. I'm not sure where we were with the Apligraf run through his insurance. We have been using Iodoflex. He has [name] thick eschar on the left first second toe interface, I suspect this may be fungal however there is no visible open 12/23/16; no open wound on his right leg. He has 2 small areas left of the linear wound that was remaining last week. We have been using Prisma, I thought I have disclosed this week, we can only look forward to next week 01/03/17; the patient had concerning areas of erythema last week, already on doxycycline for UTI through his primary doctor. The erythema is absolutely no better there is warmth and swelling both medially from the left lateral leg wound and also the dorsal left foot. 01/06/17- Patient is here for follow-up evaluation of  his left lateral leg ulcer and bilateral feet ulcers. He is on oral antibiotic therapy, tolerating that. Nursing staff and the patient states that the erythema is improved from Monday. 01/13/17; the predominant left lateral leg wound continues to be problematic. I had put Apligraf on him earlier this month once. However he subsequently developed what appeared to be an intense cellulitis around the left lateral leg wound. I gave him Dalvance I think on 2/12 perhaps 2/13 he continues on cefdinir. The erythema is still present but the warmth and swelling is improved. I am hopeful that the cellulitis part of this control. I wouldn't be surprised if there is an element of venous inflammation as well. 01/17/17. The erythema is present but better in the left leg. His left lateral leg wound still does not have [name] viable surface buttons certain parts of this long thin wound it appears like there has been improvement in dimensions. 01/20/17; the erythema still present but much better in the left leg. I'm thinking this is his usual degree of chronic venous inflammation. The wound on the left leg looks somewhat better. Is less surface slough 01/27/17; erythema is back to the chronic venous inflammation. The wound on the left leg is somewhat better. I am back to the point where I like to try an Apligraf once again 02/10/17; slight improvement in wound dimensions. Apligraf #2. He is completing his doxycycline 02/14/17; patient arrives today having completed doxycycline last Thursday. This was supposed to be [name] nurse visit however once again he hasn't tense erythema from the medial part of his wound extending over  the lower leg. Also erythema in his foot this is roughly in the same distribution as last time. He has baseline chronic venous inflammation however this is [name] lot worse than the baseline I have learned to accept the on him is baseline inflammation 02/24/17- patient is here for follow-up evaluation. He is tolerating  compression therapy. His voicing no complaints or concerns he is here anticipating an Apligraf 03/03/17; he arrives today with an adherent necrotic surface. I don't think this is surface is going to be amenable for Apligraf's. The erythema around his wound and on the left dorsal foot has resolved he is off antibiotics 03/10/17; better-looking surface today. I don't think he can tolerate Apligraf's. He tells me he had [name] wound VAC after [name] skin graft years ago to this area and they had difficulty with [name] seal. The erythema continues to be stable around this some degree of chronic venous inflammation but he also has recurrent cellulitis. We have been using Iodoflex 03/17/17; continued improvement in the surface and may be small changes in dimensions. Using Iodoflex which seems the only thing that will control his surface 03/24/17- He is here for follow up evaluation of his LLE lateral ulceration and ulcer to right dorsal foot/toe space. He is voicing no complaints or concerns, He is tolerating compression wrap. 03/31/17 arrives today with [name] much healthier looking wound on the left lower extremity. We have been using Iodoflex for [name] prolonged period of time which has for the first time prepared and adequate looking wound bed although we have not had much in the way of wound dimension improvement. He also has [name] small wound between the first and second toe on the right 04/07/17; arrives today with [name] healthy-looking wound bed and at least the top 50% of this wound appears to be now her. No debridement was required I have changed him to [facility] last week after prolonged Iodoflex. He did not do well with Apligraf's. We've had [name] re-opening between the first and second toe on the right 04/14/17; arrives today with [name] healthier looking wound bed contractions and the top 50% of this wound and some on the lesser 50%. Wound bed appears healthy. The area between the first and second toe on the right still remains  problematic 04/21/17; continued very gradual improvement. Using [facility] 04/28/17; continued very gradual improvement in the left lateral leg venous insufficiency wound. His periwound erythema is very mild. We have been using Hydrofera Blue. Wound is making progress especially in the superior 50% 05/05/17; he continues to have very gradual improvement in the left lateral venous insufficiency wound. Both in terms with an length rings are improving. I debrided this every 2 [name] with #5 curet and we have been using Hydrofera Blue and again making good progress With regards to the wounds between his right first and second toe which I thought might of been tinea pedis he is not making as much progress very dry scaly skin over the area. Also the area at the base of the left first and second toe in [name] similar condition 05/12/17; continued gradual improvement in the refractory left lateral venous insufficiency wound on the left. Dimension smaller. Surface still requiring debridement using Hydrofera Blue 05/19/17; continued gradual improvement in the refractory left lateral venous ulceration. Careful inspection of the wound bed underlying rumination suggested some degree of epithelialization over the surface no debridement indicated. Continue Hydrofera Blue difficult areas between his toes first and third on the left than first and second  on the right. I'm going to change to silver alginate from silver collagen. Continue ketoconazole as I suspect underlying tinea pedis 05/26/17; left lateral leg venous insufficiency wound. We've been using Hydrofera Blue. I believe that there is expanding epithelialization over the surface of the wound albeit not coming from the wound circumference. This is [name] bit of an odd situation in which the epithelialization seems to be coming from the surface of the wound rather than in the exact circumference. There is still small open areas mostly along the lateral margin of the  wound. He has unchanged areas between the left first and second and the right first second toes which I been treating for tenia pedis 06/02/17; left lateral leg venous insufficiency wound. We have been using Hydrofera Blue. Somewhat smaller from the wound circumference. The surface of the wound remains [name] bit on it almost epithelialized sedation in appearance. I use an open curette today debridement in the surface of all of this especially the edges Small open wounds remaining on the dorsal right first and second toe interspace and the plantar left first second toe and her face on the left 06/09/17; wound on the left lateral leg continues to be smaller but very gradual and very dry surface using Hydrofera Blue 06/16/17 requires weekly debridements now on the left lateral leg although this continues to contract. I changed to silver collagen last week because of dryness of the wound bed. Using Iodoflex to the areas on his first and second toes/web space bilaterally 06/24/17; patient with history of paraplegia also chronic venous insufficiency with lymphedema. Has [name] very difficult wound on the left lateral leg. This has been gradually reducing in terms of with but comes in with [name] very dry adherent surface. High switch to silver collagen [name] week or so ago with hydrogel to keep the area moist. This is been refractory to multiple dressing attempts. He also has areas in his first and second toes bilaterally in the anterior and posterior web space. I had been using Iodoflex here after [name] prolonged course of silver alginate with ketoconazole was ineffective [question tinea pedis] 07/14/17; patient arrives today with [name] very difficult adherent material over his left lateral lower leg wound. He also has surrounding erythema and poorly controlled edema. He was switched his Santyl last visit which the nurses are applying once during his doctor visit and once on [name] nurse visit. He was also reduced to 2 layer compression I'm  not exactly sure of the issue here. 07/21/17; better surface today after 1 week of Iodoflex. Significant cellulitis that we treated last week also better. [Doxycycline] 07/28/17 better surface today with now 2 [name] of Iodoflex. Significant cellulitis treated with doxycycline. He has now completed the doxycycline and he is back to his usual degree of chronic venous inflammation/stasis dermatitis. He reminds me he has had ablations surgery here 08/04/17; continued improvement with Iodoflex to the left lateral leg wound in terms of the surface of the wound although the dimensions are better. 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 Problematic areas on the plantar aspect of the first Tobin, Benedicto [name] ([id number]) [phone].pdf Page 7 of 38 second toe web space on the left and the dorsal aspect of the first second toe web space on the right. At one point I felt these were probably related to chronic fungal infections in treated him aggressively for this although we have not made any improvement here. 08/11/17; left lateral  leg. Surface continues to improve with the Iodoflex although we are not seeing much improvement in overall wound dimensions. Areas on his plantar left foot and right foot show no improvement. In fact the right foot looks somewhat worse 08/18/17; left lateral leg. We changed to [facility] Blue last week after [name] prolonged course of Iodoflex which helps get the surface better. It appears that the wound with is improved. Continue with difficult areas on the left dorsal first second and plantar first second on the right 09/01/17; patient arrives in clinic today having had [name] temperature of 103 yesterday. He was seen in the ER and [facility]. The patient was concerned he could have cellulitis again in the right leg however they diagnosed him with [name] UTI and he is now on Keflex. He has [name] history of cellulitis  which is been recurrent and difficult but this is been in the left leg, in the past 5 use doxycycline. He does in and out catheterizations at home which are risk factors for UTI 09/08/17; patient will be completing his Keflex this weekend. The erythema on the left leg is considerably better. He has [name] new wound today on the medial part of the right leg small superficial almost looks like [name] skin tear. He has worsening of the area on the right dorsal first and second toe. His major area on the left lateral leg is better. Using Hydrofera Blue on all areas 09/15/17; gradual reduction in width on the long wound in the left lateral leg. No debridement required. He also has wounds on the plantar aspect of his left first second toe web space and on the dorsal aspect of the right first second toe web space. 09/22/17; there continues to be very gradual improvements in the dimensions of the left lateral leg wound. He hasn't round erythematous spot with might be pressure on his wheelchair. There is no evidence obviously of infection no purulence no warmth He has [name] dry scaled area on the plantar aspect of the left first second toe Improved area on the dorsal right first second toe. 09/29/17; left lateral leg wound continues to improve in dimensions mostly with an is still [name] fairly long but increasingly narrow wound. He has [name] dry scaled area on the plantar aspect of his left first second toe web space Increasingly concerning area on the dorsal right first second toe. In fact I am concerned today about possible cellulitis around this wound. The areas extending up his second toe and although there is deformities here almost appears to abut on the nailbed. 10/06/17; left lateral leg wound continues to make very gradual progress. Tissue culture I did from the right first second toe dorsal foot last time grew MRSA and enterococcus which was vancomycin sensitive. This was not sensitive to clindamycin or doxycycline. He is  allergic to Zyvox and sulfa we have therefore arrange for him to have dalvance infusion tomorrow. He is had this in the past and tolerated it well 10/20/17; left lateral leg wound continues to make decent progress. This is certainly reduced in terms of with there is advancing epithelialization.The cellulitis in the right foot looks better although he still has [name] deep wound in the dorsal aspect of the first second toe web space. Plantar left first toe web space on the left I think is making some progress 10/27/17; left lateral leg wound continues to make decent progress. Advancing epithelialization.using Hydrofera Blue The right first second toe web space wound is better-looking using silver alginate Improvement in  the left plantar first second toe web space. Again using silver alginate 11/03/17 left lateral leg wound continues to make decent progress albeit slowly. Using [facility] The right per second toe web space continues to be [name] very problematic looking punched out wound. I obtained [name] piece of tissue for deep culture I did extensively treated this for fungus. It is difficult to imagine that this is [name] pressure area as the patient states other than going outside he doesn't really wear shoes at home The left plantar first second toe web space looked fairly senescent. Necrotic edges. This required debridement change to [facility] Blue to all wound areas 11/10/17; left lateral leg wound continues to contract. Using Hydrofera Blue On the right dorsal first second toe web space dorsally. Culture I did of this area last week grew MRSA there is not an easy oral option in this patient was multiple antibiotic allergies or intolerances. This was only [name] rare culture isolate I'm therefore going to use Bactroban under silver alginate On the left plantar first second toe web space. Debridement is required here. This is also unchanged 11/17/17; left lateral leg wound continues to contract using Hydrofera  Blue this is no longer the major issue. The major concern here is the right first second toe web space. He now has an open area going from dorsally to the plantar aspect. There is now wound on the inner lateral part of the first toe. Not [name] very viable surface on this. There is erythema spreading medially into the forefoot. No major change in the left first second toe plantar wound 11/24/17; left lateral leg wound continues to contract using Hydrofera Blue. Nice improvement today The right first second toe web space all of this looks [name] lot less angry than last week. I have given him clindamycin and topical Bactroban for MRSA and terbinafine for the possibility of underlining tinea pedis that I could not control with ketoconazole. Looks somewhat better The area on the plantar left first second toe web space is weeping with dried debris around the wound 12/01/17; left lateral leg wound continues to contract he Hydrofera Blue. It is becoming thinner in terms of with nevertheless it is making good improvement. The right first second toe web space looks less angry but still [name] large necrotic-looking wounds starting on the plantar aspect of the right foot extending between the toes and now extensively on the base of the right second toe. I gave him clindamycin and topical Bactroban for MRSA anterior benefiting for the possibility of underlying tinea pedis. Not looking better today The area on the left first/second toe looks better. Debrided of necrotic debris 12/05/17* the patient was worked in urgently today because over the weekend he found blood on his incontinence bad when he woke up. He was found to have an ulcer by his wife who does most of his wound care. He came in today for [place] to look at this. He has not had [name] history of wounds in his buttocks in spite of his paraplegia. 12/08/17; seen in follow-up today at his usual appointment. He was seen earlier this week and found to have [name] new wound on his buttock.  We also follow him for wounds on the left lateral leg, left first second toe web space and right first second toe web space 12/15/17; we have been using Hydrofera Blue to the left lateral leg which has improved. The right first second toe web space has also improved. Left first second toe web space  plantar aspect looks stable. The left buttock has worsened using Santyl. Apparently the buttock has drainage 12/22/17; we have been using Hydrofera Blue to the left lateral leg which continues to improve now 2 small wounds separated by normal skin. He tells [place] he had [name] fever up to 100 yesterday he is prone to UTIs but has not noted anything different. He does in and out catheterizations. The area between the first and second toes today does not look good necrotic surface covered with what looks to be purulent drainage and erythema extending into the third toe. I had gotten this to something that I thought look better last time however it is not look good today. He also has [name] necrotic surface over the buttock wound which is expanded. I thought there might be infection under here so I removed [name] lot of the surface with [name] #5 curet though nothing look like it really needed culturing. He is been using Santyl to this area 12/27/17; his original wound on the left lateral leg continues to improve using Hydrofera Blue. I gave him samples of Baxdella although he was unable to take them out of fear for an allergic reaction ["lump in his throat"].the culture I did of the purulent drainage from his second toe last week showed both enterococcus and [name] set Enterobacter I was also concerned about the erythema on the bottom of his foot although paradoxically although this looks somewhat better today. 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 We've been using silver alginate here. They came up today that he sleeps in [name] recliner, I'm not  sure why but I asked him to stop this 01/03/18; his original wound we've been using Hydrofera Blue is now separated into 2 areas. Ulcer on his left buttock is better he is off the recliner and sleeping in bed Finally both wound areas between his first and second toes also looks some better 01/10/18; his original wound on the left lateral leg is now separated into 2 wounds we've been using Hydrofera Blue Ulcer on his left buttock has some drainage. There is [name] small probing site going into muscle layer superiorly.using silver alginate -He arrives today with [name] deep tissue injury on the left heel The wound on the dorsal aspect of his first second toe on the left looks [name] lot betterusing silver alginate ketoconazole The area on the first second toe web space on the right also looks [name] lot bette 01/17/18; his original wound on the left lateral leg continues to progress using Hydrofera Blue Ulcer on his left buttock also is smaller surface healthier except for [name] small probing site going into the muscle layer superiorly. 2.4 cm of tunneling in this area DTI on his left heel we have only been offloading. Looks better than last week no threatened open no evidence of infection the wound on the dorsal aspect of the first second toe on the left continues to look like it's regressing we have only been using silver alginate and terbinafine orally The area in the first second toe web space on the right also looks to be [name] lot better using silver alginate and terbinafine I think this was prompted by tinea pedis [name], [name] ([id number] Page 8 of 38 01/31/18; the patient was hospitalized in [place] last week apparently for [name] complicated UTI. He was discharged on cefepime he does in and out catheterizations. In the hospital he was  discovered M I don't mild elevation of AST and ALT and the terbinafine was stopped.predictably the pressure ulcer on s his buttock looks betterusing  silver alginate. The area on the left lateral leg also is better using Hydrofera Blue. The area between the first and second toes on the left better. First and second toes on the right still substantial but better. Finally the DTI on the left heel has held together and looks like it's resolving 02/07/18-he is here in follow-up evaluation for multiple ulcerations. He has new injury to the lateral aspect of the last issue [name] pressure ulcer, he states this is from adhesive removal trauma. He states he has tried multiple adhesive products with no success. All other ulcers appear stable. The left heel DTI is resolving. We will continue with same treatment plan and follow-up next week. 02/14/18; follow-up for multiple areas. He has [name] new area last week on the lateral aspect of his pressure ulcer more over the posterior trochanter. The original pressure ulcer looks quite stable has healthy granulation. 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 Almost fully epithelialized on the original superior area using Hydrofera Blue DTI on the left heel has peeled off this week to reveal [name] small superficial wound under denuded skin and subcutaneous tissue Both areas between the first and second toes look better including nothing open on the left 02/21/18; The patient's wounds on his left ischial tuberosity and posterior left greater trochanter actually looked better. He has [name] large area of irritation around the area which I think is contact dermatitis. I am doubtful that this is fungal His original wound on the left lateral calf continues to improve we have been using Hydrofera Blue There is no open area in the left first second toe web space although there is [name] lot of thick callus The DTI on the left heel required debridement today of necrotic surface eschar and subcutaneous tissue using silver alginate Finally the  area on the right first second toe webspace continues to contract using silver alginate and ketoconazole 02/28/18 Left ischial tuberosity wounds look better using silver alginate. Original wound on the left calf only has one small open area left using Hydrofera Blue DTI on the left heel required debridement mostly removing skin from around this wound surface. Using silver alginate The areas on the right first/second toe web space using silver alginate and ketoconazole 03/08/18 on evaluation today patient appears to be doing decently well as best I can tell in regard to his wounds. This is the first time that I have seen him as he generally is followed by Dr. Dellia Nims. With that being said none of his wounds appear to be infected he does have an area where there is some skin covering what appears to be [name] new wound on the left dorsal surface of his great toe. This is right at the nail bed. With that being said I do believe that debrided away some of the excess skin can be of benefit in this regard. Otherwise he has been tolerating the dressing changes without complication. 03/14/18; patient arrives today with the multiplicity of wounds that we are following. He has not been systemically unwell Original wound on the left lateral calf now only has 2 small open areas we've been using Hydrofera Blue which should continue The deep tissue injury on the left heel requires debridement today. We've been using silver alginate The left  first second toe and the right first second toe are both are reminiscence what I think was tinea pedis. Apparently some of the callus Surface between the toes was removed last week when it started draining. Purulent drainage coming from the wound on the ischial tuberosity on the left. 03/21/18-He is here in follow-up evaluation for multiple wounds. 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 He tolerated  debridement. The only change to last week's recommendations is to discontinue antifungal cream between toes. He will follow-up next week 03/28/18; following up for multiple wounds;Concern this week is streaking redness and swelling in the right foot. He is going to need antibiotics for this. 03/31/18; follow-up for right foot cellulitis. Streaking redness and swelling in the right foot on 03/28/18. 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 I put him on clindamycin 300 mg every 6 and brought him in for [name] quick check. He has an open wound between his first and second toes on the right foot as [name] potential source. 04/04/18; Right foot cellulitis is resolving he is completing clindamycin. This is truly good news Left lateral calf wound which is initial wound only has one small open area inferiorly this is close to healing out. He has compression stockings. We will use Hydrofera Blue right down to the epithelialization of this Nonviable surface on the left heel which was initially pressure with [name] DTI. We've been using Hydrofera Blue. I'm going to switch this back to silver alginate Left first second toe/tinea pedis this looks better using silver alginate Right first second toe tinea pedis using silver alginate Large pressure ulcers on theLeft ischial tuberosity. Small wound here Looks better. I am uncertain about the surface over the large wound. Using silver alginate 04/11/18; Cellulitis in the right foot is resolved Left lateral calf wound which was his original wounds still has 2 tiny open areas remaining this is just about closed Nonviable surface on the left heel is better but still requires debridement Left first second toe/tinea pedis still open using silver alginate Right first second toe wound tinea pedis I asked him to go back to using ketoconazole and silver alginate Large pressure ulcers on the left ischial tuberosity this shear injury here is resolved. Wound is smaller. No  evidence of infection using silver alginate 04/18/18; Patient arrives with an intense area of cellulitis in the right mid lower calf extending into the right heel area. Bright red and warm. Smaller area on the left anterior leg. He has [name] significant history of MRSA. He will definitely need antibioticsdoxycycline He now has 2 open areas on the left ischial tuberosity the original large wound and now [name] satellite area which I think was above his initial satellite areas. Not [name] wonderful surface on this satellite area surrounding erythema which looks like pressure related. His left lateral calf wound again his original wound is just about closed Left heel pressure injury still requiring debridement Left first second toe looks [name] lot better using silver alginate Right first second toe also using silver alginate and ketoconazole cream also looks better 04/20/18; the patient was worked in early today out of concerns with his cellulitis on the right leg. I had started him on doxycycline. This was 2 days ago. His wife was concerned about the swelling in the area. Also concerned about the left buttock. He has not been systemically unwell no fever chills. No nausea vomiting or diarrhea 04/25/18; the patient's left buttock  wound is continued to deteriorate he is using Hydrofera Blue. He is still completing clindamycin for the cellulitis on the right leg although all of this looks better. 05/02/18 Left buttock wound still with [name] lot of drainage and [name] very tightly adherent fibrinous necrotic surface. He has [name] deeper area superiorly The left lateral calf wound is still closed DTI wound on the left heel necrotic surface especially the circumference using Iodoflex Areas between his left first second toe and right first second toe both look better. Dorsally and the right first second toe he had [name] necrotic surface although at smaller. In using silver alginate and ketoconazole. I did [name] culture last week which was [name] deep  tissue culture of the reminiscence of the open wound on the right first second toe dorsally. This grew [name] few Acinetobacter and [name] few methicillin-resistant staph aureus. Nevertheless the area actually this week looked better. I didn't feel the need to specifically address this at least in terms of systemic antibiotics. 05/09/18; wounds are measuring larger more drainage per our intake. We are using Santyl covered with alginate on the large superficial buttock wounds, Iodosorb on the left heel, ketoconazole and silver alginate to the dorsal first and second toes bilaterally. 05/16/18; The area on his left buttock better in some aspects although the area superiorly over the ischial tuberosity required an extensive debridement.using Santyl Left heel appears stable. Using Iodoflex [name], [name] [name] ([id number]) [phone].pdf Page 9 of 38 The areas between his first and second toes are not bad however there is spreading erythema up the dorsal aspect of his left foot this looks like cellulitis again. He is insensate the erythema is really very brilliant.o Erysipelas He went to see an allergist days ago because he was itching part of this he had lab work done. This showed [name] white count of 15.1 with 70% neutrophils. Hemoglobin of 11.4 and [name] platelet count of 659,000. Last white count we had in Epic was [name] 2-1/2 years ago which was 25.9 but he was ill at the time. He was able to show me some lab work that was done by his primary physician the pattern is about the same. 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 But prompted me to go ahead and do x-rays of both feet and the pelvis rule out osteomyelitis. He also had [name] comprehensive metabolic panel this was reasonably normal his albumin was 3.7 liver function tests BUN/creatinine all normal 05/23/18; x-rays of both his feet from last week were negative for underlying pulmonary abnormality. The x-ray of  his pelvis however showed mild irregularity in the left ischial which may represent some early osteomyelitis. The wound in the left ischial continues to get deeper clearly now exposed muscle. Each week necrotic surface material over this area. Whereas the rest of the wounds do not look so bad. The left ischial wound we have been using Santyl and calcium alginate T the left heel surface necrotic debris using Iodoflex o The left lateral leg is still healed Areas on the left dorsal foot and the right dorsal foot are about the same. There is some inflammation on the left which might represent contact dermatitis, fungal dermatitis I am doubtful cellulitis although this looks better than last week 05/30/18; CT scan done at Hospital did not show any osteomyelitis or abscess. Suggested the possibility of underlying cellulitis although I don't see [name] lot of evidence of this at the bedside The wound itself on  the left buttock/upper thigh actually looks somewhat better. No debridement Left heel also looks better no debridement continue Iodoflex Both dorsal first second toe spaces appear better using Lotrisone. Left still required debridement 06/06/18; Intake reported some purulent looking drainage from the left gluteal wound. Using Santyl and calcium alginate Left heel looks better although still [name] nonviable surface requiring debridement The left dorsal foot first/second webspace actually expanding and somewhat deeper. I may consider doing [name] shave biopsy of this area Right dorsal foot first/second webspace appears stable to improved. Using Lotrisone and silver alginate to both these areas 06/13/18 Left gluteal surface looks better. Now separated in the 2 wounds. No debridement required. Still drainage. We'll continue silver alginate Left heel continues to look better with Iodoflex continue this for at least another week Of his dorsal foot wounds the area on the left still has some depth although it looks  better than last week. We've been using Lotrisone and silver alginate 06/20/18 Left gluteal continues to look better healthy tissue Left heel continues to look better healthy granulation wound is smaller. He is using Iodoflex and his long as this continues continue the Iodoflex Dorsal right foot looks better unfortunately dorsal left foot does not. There is swelling and erythema of his forefoot. He had minor trauma to this several days ago but doesn't think this was enough to have caused any tissue injury. Foot looks like cellulitis, we have had this problem before 06/27/18 on evaluation today patient appears to be doing [name] little worse in regard to his foot ulcer. 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 Both of which would really be his only options for treating this infection. In the past he has been given and effusion of Orbactiv. This is done very well for him in the past again it's one time dosing IV antibiotic therapy. Subsequently I do believe this is something we're gonna need to see about doing at this point in time. Currently his other wounds seem to be doing somewhat better in my pinion I'm pretty happy in that regard. 07/03/18 on evaluation today patient's wounds actually appear to be doing fairly well. He has been tolerating the dressing changes without complication. All in all he seems to be showing signs of improvement. In regard to the antibiotics he has been dealing with infectious disease since I saw him last week as far as getting this scheduled. In the end [facility]'s [facility] this coming Friday. In the meantime he has been continuing to perform the dressing changes in such as previous. There does not appear to be any evidence of infection worsengin at this time. 07/10/18; Since I last saw  this man 2 [name] ago things have actually improved. IV antibiotics of resulted in less forefoot erythema although there is still some present. He is not systemically unwell Left buttock wounds 2 now have no depth there is increased epithelialization Using silver alginate Left heel still requires debridement using Iodoflex Left dorsal foot still with [name] sizable wound about the size of [name] border but healthy granulation Right dorsal foot still with [name] slitlike area using silver alginate 07/18/18; the patient's cellulitis in the left foot is improved in fact I think it is on its way to resolving. Left buttock wounds 2 both look better although the larger  one has hypertension granulation we've been using silver alginate Left heel has some thick circumferential redundant skin over the wound edge which will need to be removed today we've been using Iodoflex Left dorsal foot is still [name] sizable wound required debridement using silver alginate The right dorsal foot is just about closed only [name] small open area remains here 07/25/18; left foot cellulitis is resolved Left buttock wounds 2 both look better. Hyper-granulation on the major area Left heel as some debris over the surface but otherwise looks [name] healthier wound. Using silver collagen Right dorsal foot is just about closed 07/31/18; arrives with our intake nurse worried about purulent drainage from the buttock. We had hyper-granulation here last week His buttock wounds 2 continue to look better Left heel some debris over the surface but measuring smaller. Right dorsal foot unfortunately has openings between the toes Left foot superficial wound looks less aggravated. 08/07/18 Buttock wounds continue to look better although some of her granulation and the larger medial wound. silver alginate Left heel continues to look [name] lot better.silver collagen Left foot superficial wound looks less stable. Requires debridement. He has [name] new wound superficial area on the  foot on the lateral dorsal foot. Right foot looks better using silver alginate without Lotrisone 08/14/2018; patient was in the ER last week diagnosed with [name] UTI. He is now on Cefpodoxime and Macrodantin. Buttock wounds continued to be smaller. Using silver alginate Left heel continues to look better using silver collagen Left foot superficial wound looks as though it is improving Right dorsal foot area is just about healed. 08/21/2018; patient is completed his antibiotics for his UTI. He has 2 open areas on the buttocks. There is still not closed although the surface looks satisfactory. Using silver alginate Left heel continues to improve using silver collagen The bilateral dorsal foot areas which are at the base of his first and second toes/possible tinea pedis are actually stable on the left but worse on the right. The area on the left required debridement of necrotic surface. After debridement I obtained [name] specimen for PCR culture. The right dorsal foot which is been just about healed last week is now reopened 08/28/2018; culture done on the left dorsal foot showed coag negative staph both staph epidermidis and Lugdunensis. I think this is worthwhile initiating systemic treatment. I will use doxycycline given his long list of allergies. The area on the left heel slightly improved but still requiring debridement. The large wound on the buttock is just about closed whereas the smaller one is larger. Using silver alginate in this area 09/04/2018; patient is completing his doxycycline for the left foot although this continues to be [name] very difficult wound area with very adherent necrotic debris. We are using silver alginate to all his wounds right foot left foot and the small wounds on his buttock, silver collagen on the left heel. 09/11/2018; once again this patient has intense erythema and swelling of the left forefoot. Lesser degrees of erythema in the right foot. He has [name] long list of allergies  and intolerances. I will reinstitute doxycycline. Perrow, Pharell [name] ([id number]) [phone].pdf Page 10 of 38 2 small areas on the left buttock are all the left of his major stage III pressure ulcer. Using silver alginate Left heel also looks better using silver collagen Unfortunately both the areas on his feet look worse. The area on the left first second webspace is now gone through to the plantar part of his foot. The area on the left  foot anteriorly is irritated with erythema and swelling in the forefoot. 09/25/2018 His wound on the left plantar heel looks better. Using silver collagen The area on the left buttock 2 small remnant areas. One is closed one is still open. Using silver alginate The areas between both his first and second toes look worse. This in spite of long-standing antifungal therapy with ketoconazole and silver alginate which should have antifungal activity He has small areas around his original wound on the left calf one is on the bottom of the original scar tissue and one superiorly both of these are small and superficial but again given wound history in this site this is worrisome 10/02/2018 Left plantar heel continues to gradually contract using silver collagen Left buttock wound is unchanged using silver alginate The areas on his dorsal feet between his first and second toes bilaterally look about the same. I prescribed clindamycin ointment to see if we can address chronic staph colonization and also the underlying possibility of erythrasma The left lateral lower extremity wound is actually on the lateral part of his ankle. Small open area here. We have been using silver alginate 10/09/2018; Left plantar heel continues to look healthy and contract. No debridement is required Left buttock slightly smaller with [name] tape injury wound just below which was new this week Dorsal feet somewhat improved I have been using clindamycin Left lateral looks lower  extremity the actual open area looks worse although [name] lot of this is epithelialized. I am going to change to silver collagen today He has [name] lot more swelling in the right leg although this is not pitting not red and not particularly warm there is [name] lot of spasm in the right leg usually indicative of people with paralysis of some underlying discomfort. We have reviewed his vascular status from 2017 he had [name] left greater saphenous vein ablation. I wonder about referring him back to vascular surgery if the area on the left leg continues to deteriorate. 10/16/2018 in today for follow-up and management of multiple lower extremity ulcers. His left Buttock wound is much lower smaller and almost closed completely. The wound to the left ankle has began to reopen with Epithelialization and some adherent slough. He has multiple new areas to the left foot and leg. The left dorsal foot without much improvement. Wound present between left great webspace and 2nd toe. Erythema and edema present right leg. Right LE ultrasound obtained on 10/10/18 was negative for DVT . 10/23/2018; Left buttock is closed over. Still dry macerated skin but there is no open wound. I suspect this is chronic pressure/moisture Left lateral calf is quite [name] bit worse than when I saw this last. There is clearly drainage here he has macerated skin into the left plantar heel. We will change the primary dressing to alginate Left dorsal foot has some improvement in overall wound area. Still using clindamycin and silver alginate Right dorsal foot about the same as the left using clindamycin and silver alginate The erythema in the right leg has resolved. He is DVT rule out was negative Left heel pressure area required debridement although the wound is smaller and the surface is health 10/26/2018 The patient came back in for his nurse check today predominantly because of the drainage coming out of the left lateral leg with [name] recent reopening of  his original wound on the left lateral calf. He comes in today with [name] large amount of surrounding erythema around the wound extending from the calf into the ankle  and even in the area on the dorsal foot. He is not systemically unwell. He is not febrile. Nevertheless this looks like cellulitis. We have been using silver alginate to the area. I changed him to [name] regular visit and I am going to prescribe him doxycycline. The rationale here is [name] long list of medication intolerances and [name] history of MRSA. I did not see anything that I thought would provide [name] valuable culture 10/30/2018 Follow-up from his appointment 4 days ago with really an extensive area of cellulitis in the left calf left lateral ankle and left dorsal foot. I put him on doxycycline. He has [name] long list of medication allergies which are true allergy reactions. 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 In any case he is [name] lot better today. The erythema especially in the anterior and lateral left calf is better. He still has left ankle erythema. He also is complaining about increasing edema in the right leg we have only been using Kerlix Coban and he has been doing the wraps at home. Finally he has [name] spotty rash on the medial part of his upper left calf which looks like folliculitis or perhaps wrap occlusion type injury. Small superficial macules not pustules 11/06/18 patient arrives today with again [name] considerable degree of erythema around the wound on the left lateral calf extending into the dorsal ankle and dorsal foot. This is [name] lot worse than when I saw this last week. He is on doxycycline really with not [name] lot of improvement. He has not been systemically unwell Wounds on the; left heel actually looks improved. Original area on the left foot and proximity to the first and second toes looks about the same. He has superficial areas on the dorsal foot, anterior calf and  then the reopening of his original wound on the left lateral calf which looks about the same The only area he has on the right is the dorsal webspace first and second which is smaller. He has [name] large area of dry erythematous skin on the left buttock small open area here. 11/13/2018; the patient arrives in much better condition. The erythema around the wound on the left lateral calf is [name] lot better. Not sure whether this was the clindamycin or the TCA and ketoconazole or just in the improvement in edema control [stasis dermatitis]. In any case this is [name] lot better. The area on the left heel is very small and just about resolved using silver collagen we have been using silver alginate to the areas on his dorsal feet 11/20/2018; his wounds include the left lateral calf, left heel, dorsal aspects of both feet just proximal to the first second webspace. He is stable to slightly improved. I did not think any changes to his dressings were going to be necessary 11/27/2018 he has [name] reopening on the left buttock which is surrounded by what looks like tinea or perhaps some other form of dermatitis. The area on the left dorsal foot has some erythema around it I have marked this area but I am not sure whether this is cellulitis or not. Left heel is not closed. Left calf the reopening is really slightly longer and probably worse 1/13; in general things look better and smaller except for the left dorsal foot. Area on the left heel is just about closed, left buttock looks better only [name] small wound remains in the skin looks better [using Lotrisone] 1/20; the area  on the left heel only has [name] few remaining open areas here. Left lateral calf about the same in terms of size, left dorsal foot slightly larger right lateral foot still not closed. The area on the left buttock has no open wound and the surrounding skin looks [name] lot better 1/27; the area on the left heel is closed. Left lateral calf better but still requiring  extensive debridements. The area on his left buttock is closed. He still has the open areas on the left dorsal foot which is slightly smaller in the right foot which is slightly expanded. We have been using Iodoflex on these areas as well 2/3; left heel is closed. Left lateral calf still requiring debridement using Iodoflex there is no open area on his left buttock however he has dry scaly skin over [name] large area of this. Not really responding well to the Lotrisone. Finally the areas on his dorsal feet at the level of the first second webspace are slightly smaller on the right and about the same on the left. Both of these vigorously debrided with Anasept and gauze 2/10; left heel remains closed he has dry erythematous skin over the left buttock but there is no open wound here. Left lateral leg has come in and with. Still requiring debridement we have been using Iodoflex here. Finally the area on the left dorsal foot and right dorsal foot are really about the same extremely dry callused fissured areas. He does not yet have [name] dermatology appointment 2/17; left heel remains closed. He has [name] new open area on the left buttock. The area on the left lateral calf is bigger longer and still covered in necrotic debris. No major change in his foot areas bilaterally. I am awaiting for [name] dermatologist to look on this. We have been using ketoconazole I do not know that this is been doing any good at all. 2/24; left heel remains closed. The left buttock wound that was new reopening last week looks better. The left lateral calf appears better also although still requires debridement. The major area on his foot is the left first second also requiring debridement. We have been putting Prisma on all wounds. I do not believe that the ketoconazole has done too much good for his feet. He will use Lotrisone I am going to give him [name] 2-week course of terbinafine. We still do not have [name] dermatology appointment 3/2 left heel  remains closed however there is skin over bone in this area I pointed this out to him today. The left buttock wound is epithelialized but still does not look completely stable. The area on the left leg required debridement were using silver collagen here. With regards to his feet we changed to [place] last [place], [place] ([id number]) [phone].pdf Page 11 of 38 week and silver alginate. 3/9; left heel remains closed. Left buttock remains closed. The area on the right foot is essentially closed. The left foot remains unchanged. Slightly smaller on the left lateral calf. Using silver collagen to both of these areas 3/16-Left heel remains closed. Area on right foot is closed. Left lateral calf above the lateral malleolus open wound requiring debridement with easy bleeding. Left dorsal wound proximal to first toe also debrided. Left ischial area open new. Patient has been using Prisma with wrapping every 3 days. Dermatology appointment is apparently tomorrow.Patient has completed his terbinafine 2-week course with some apparent improvement according to him, there is still flaking and dry skin in his foot on the left  3/23; area on the right foot is reopened. The area on the left anterior foot is about the same still [name] very necrotic adherent surface. He still has the area on the left leg and reopening is on the left buttock. He apparently saw dermatology although I do not have [name] note. According to the patient who is usually fairly well informed they did not have any good ideas. Put him on oral terbinafine which she is been on before. 3/30; using silver collagen to all wounds. Apparently his dermatologist put him on doxycycline and rifampin presumably some culture grew staph. I do not have this result. He remains on terbinafine although I have used terbinafine on him before 4/6; patient has had [name] fairly substantial reopening on the right foot between the first and second toes. He  is finished his terbinafine and I believe is on doxycycline and rifampin still as prescribed by dermatology. We have been using silver collagen to all his wounds although the patient reports that he thinks silver alginate does better on the wounds on his buttock. 4/13; the area on his left lateral calf about the same size but it did not require debridement. Left dorsal foot just proximal to the webspace between the first and second toes is about the same. Still nonviable surface. I note some superficial bronze discoloration of the dorsal part of his foot Right dorsal foot just proximal to the first and second toes also looks about the same. I still think there may be the same discoloration I noted above on the left Left buttock wound looks about the same 4/20; left lateral calf appears to be gradually contracting using silver collagen. 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 The left dorsal foot wound is debrided of tightly adherent necrotic debris and really cleans up quite nicely. The right area is worse with expansion. I did not debride this it is now over the base of the second toe The area on his left buttock is smaller no debridement is required using silver collagen 5/4; left calf continues to make good progress. He arrives with erythema around the wounds on his dorsal foot which even extends to the plantar aspect. Very concerning for coexistent infection. He is finished the erythromycin I gave him for possible erythrasma this does not seem to have helped. The area on the left foot is about the same base of the dorsal toes Is area on the buttock looks improved on the left 5/11; left calf and left buttock continued to make good progress. Left foot is about the same to slightly improved. Major problem is on the right foot. He has not had an x-ray. Deep tissue culture I did last week showed both Enterobacter and [name]. coli. I did not  change the doxycycline I put him on empirically although neither 1 of these were plated to doxycycline. He arrives today with the erythema looking worse on both the dorsal and plantar foot. Macerated skin on the bottom of the foot. he has not been systemically unwell 5/18-Patient returns at 1 week, left calf wound appears to be making some progress, left buttock wound appears slightly worse than last time, left foot wound looks slightly better, right foot redness is marginally better. X-ray of both feet show no air or evidence of osteomyelitis. Patient is finished his Omnicef and terbinafine. He continues to have macerated skin on the bottom of the left foot as well as right 5/26; left calf wound is better, left  buttock wound appears to have multiple small superficial open areas with surrounding macerated skin. X-rays that I did last time showed no evidence of osteomyelitis in either foot. He is finished cefdinir and doxycycline. I do not think that he was on terbinafine. He continues to have [name] large superficial open area on the right foot anterior dorsal and slightly between the first and second toes. I did send him to dermatology 2 months ago or so wondering about whether they would do [name] fungal scraping. I do not believe they did but did do [name] culture. We have been using silver alginate to the toe areas, he has been using antifungals at home topically either ketoconazole or Lotrisone. We are using silver collagen on the left foot, silver alginate on the right, silver collagen on the left lateral leg and silver alginate on the left buttock 6/1; left buttock area is healed. We have the left dorsal foot, left lateral leg and right dorsal foot. We are using silver alginate to the areas on both feet and silver collagen to the area on his left lateral calf 6/8; the left buttock apparently reopened late last week. He is not really sure how this happened. He is tolerating the terbinafine. Using silver  alginate to all wounds 6/15; left buttock wound is larger than last week but still superficial. Came in the clinic today with [name] report of purulence from the left lateral leg I did not identify any infection Both areas on his dorsal feet appear to be better. He is tolerating the terbinafine. Using silver alginate to all wounds 6/22; left buttock is about the same this week, left calf quite [name] bit better. His left foot is about the same however he comes in with erythema and warmth in the right forefoot once again. Culture that I gave him in the beginning of May showed Enterobacter and [name]. coli. I gave him doxycycline and things seem to improve although neither 1 of these organisms was specifically plated. 6/29; left buttock is larger and dry this week. Left lateral calf looks to me to be improved. Left dorsal foot also somewhat improved right foot completely unchanged. The erythema on the right foot is still present. He is completing the Ceftin dinner that I gave him empirically [see discussion above.) 7/6 - All wounds look to be stable and perhaps improved, the left buttock wound is slightly smaller, per patient bleeds easily, completed ceftin, the right foot redness is less, he is on terbinafine 7/13; left buttock wound about the same perhaps slightly narrower. Area on the left lateral leg continues to narrow. Left dorsal foot slightly smaller right foot about the same. We are using silver alginate on the right foot and Hydrofera Blue to the areas on the left. Unna boot on the left 2 layer compression on the right 7/20; left buttock wound absolutely the same. Area on lateral leg continues to get better. Left dorsal foot require debridement as did the right no major change in the 7/27; left buttock wound the same size necrotic debris over the surface. The area on the lateral leg is closed once again. His left foot looks better right foot about the same although there is some involvement now of the  posterior first second toe area. He is still on terbinafine which I have given him for [name] month, not certain [name] centimeter major change 06/25/19-All wounds appear to be slightly improved according to report, left buttock wound looks clean, both foot wounds have minimal to no debris  the right dorsal foot has minimal slough. We are using Hydrofera Blue to the left and silver alginate to the right foot and ischial wound. 8/10-Wounds all appear to be around the same, the right forefoot distal part has some redness which was not there before, however the wound looks clean and small. Ischial wound looks about the same with no changes 8/17; his wound on the left lateral calf which was his original chronic venous insufficiency wound remains closed. Since I last saw him the areas on the left dorsal foot right dorsal foot generally appear better but require debridement. The area on his left initial tuberosity appears somewhat larger to me perhaps hyper granulated and bleeds very easily. We have been using Hydrofera Blue to the left dorsal foot and silver alginate to everything else 8/24; left lateral calf remains closed. The areas on his dorsal feet on the webspace of the first and second toes bilaterally both look better. The area on the left buttock which is the pressure ulcer stage II slightly smaller. I change the dressing to Hydrofera Blue to all areas 8/31; left lateral calf remains closed. The area on his dorsal feet bilaterally look better. Using Hydrofera Blue. Still requiring debridement on the left foot. No change in the left buttock pressure ulcers however 9/14; left lateral calf remains closed. Dorsal feet look quite [name] bit better than 2 [name] ago. Flaking dry skin also [name] lot better with the ammonium lactate I gave him 2 [name] ago. The area on the left buttock is improved. He states that his Roho cushion developed [name] leak and he is getting [name] new one, in the interim he is offloading this vigorously 9/21;  left calf remains closed. Left heel which was [name] possible DTI looks better this week. He had macerated tissue around the left dorsal foot right foot looks satisfactory and improved left buttock wound. I changed his dressings to his feet to silver alginate bilaterally. Continuing Hydrofera Blue on the left buttock. 9/28 left calf remains closed. Left heel did not develop anything [possible DTI] dry flaking skin on the left dorsal foot. Right foot looks satisfactory. Improved left buttock wound. We are using silver alginate on his feet Hydrofera Blue on the buttock. I have asked him to go back to the Lotrisone on his feet including the wounds and surrounding areas 10/5; left calf remains closed. The areas on the left and right feet about the same. [name] lot of this is epithelialized however debris over the remaining open areas. He is using Lotrisone and silver alginate. The area on the left buttock using Hydrofera Blue Keatley, Ranon [name] ([id number]) [phone].pdf Page 12 of 38 10/26. Patient has been out for 3 [name] secondary to Covid concerns. He tested negative but I think his wife tested positive. He comes in today with the left foot substantially worse, right foot about the same. Even more concerning he states that the area on his left buttock closed over but then reopened and is considerably deeper in one aspect than it was before [stage III wound] 11/2; left foot really about the same as last week. Quarter sized wound on the dorsal foot just proximal to the first second toes. Surrounding erythema with areas of denuded epithelium. This is not really much different looking. Did not look like cellulitis this time however. Right foot area about the same.. We have been using silver alginate alginate on his toes Left buttock still substantial irritated skin around the wound which I think looks somewhat better.  We have been using Hydrofera Blue here. 11/9; left foot larger than last week  and [name] very necrotic surface. Right foot I think is about the same perhaps slightly smaller. Debris around the circumference also addressed. Unfortunately on the left buttock there is been [name] decline. Satellite lesions below the major wound distally and now [name] an additional one posteriorly we have been using Hydrofera Blue but I think this is [name] pressure issue 11/16; left foot ulcer dorsally again [name] very adherent necrotic surface. Right foot is about the same. Not much change in the pressure ulcer on his left buttock. 11/30; left foot ulcer dorsally basically the same as when I saw him 2 [name] ago. Very adherent fibrinous debris on the wound surface. Patient reports [name] lot of drainage as well. 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 We have been using Iodoflex, patient changed back to alginate because of the drainage. Area on his right dorsal foot really looks benign with [name] healthier surface certainly [name] lot better than on the left. Left buttock wounds all improved using Hydrofera Blue 12/7; left dorsal foot again no improvement. Tightly adherent debris. PCR culture I did last week only showed likely skin contaminant. I have gone ahead and done [name] punch biopsy of this which is about the last thing in terms of investigations I can think to do. He has known venous insufficiency and venous hypertension and this could be the issue here. The area on the right foot is about the same left buttock slightly worse according to our intake nurse secondary to [facility] Blue sticking to the wound 12/14; biopsy of the left foot that I did last time showed changes that could be related to wound healing/chronic stasis dermatitis phenomenon no neoplasm. We have been using silver alginate to both feet. I change the one on the left today to Sorbact and silver alginate to his other 2 wounds 12/28; the patient arrives with the following problems; Major issue is the dorsal left  foot which continues to be [name] larger deeper wound area. Still with [name] completely nonviable surface Paradoxically the area mirror image on the right on the right dorsal foot appears to be getting better. He had some loss of dry denuded skin from the lower part of his original wound on the left lateral calf. Some of this area looked [name] little vulnerable and for this reason we put him in wrap that on this side this week The area on his left buttock is larger. He still has the erythematous circular area which I think is [name] combination of pressure, sweat. This does not look like cellulitis or fungal dermatitis 11/26/2019; -Dorsal left foot large open wound with depth. Still debris over the surface. Using Sorbact The area on the dorsal right foot paradoxically has closed over He has [name] reopening on the left ankle laterally at the base of his original wound that extended up into the calf. This appears clean. The left buttock wound is smaller but with very adherent necrotic debris over the surface. We have been using silver alginate here as well The patient had arterial studies done in 2017. He had biphasic waveforms at the dorsalis pedis and posterior tibial bilaterally. ABI in the left was 1.17. Digit waveforms were dampened. He has slight spasticity in the great toes I do not think [name] TBI would be possible 1/11; the patient comes in today with [name] sizable reopening between the first and second toes on the  right. This is not exactly in the same location where we have been treating wounds previously. According to our intake nurse this was actually fairly deep but 0.6 cm. The area on the left dorsal foot looks about the same the surface is somewhat cleaner using Sorbact, his MRI is in 2 days. We have not managed yet to get arterial studies. The new reopening on the left lateral calf looks somewhat better using alginate. The left buttock wound is about the same using alginate 1/18; the patient had his ARTERIAL studies  which were quite normal. ABI in the right at 1.13 with triphasic/biphasic waveforms on the left ABI 1.06 again with triphasic/biphasic waveforms. It would not have been possible to have done [name] toe brachial index because of spasticity. We have been using Sorbac to the left foot alginate to the rest of his wounds on the right foot left lateral calf and left buttock 1/25; arrives in clinic with erythema and swelling of the left forefoot worse over the first MTP area. This extends laterally dorsally and but also posteriorly. Still has an area on the left lateral part of the lower part of his calf wound it is eschared and clearly not closed. Area on the left buttock still with surrounding irritation and erythema. Right foot surface wound dorsally. The area between the right and first and second toes appears better. 2/1; The left foot wound is about the same. Erythema slightly better I gave him [name] week of doxycycline empirically Right foot wound is more extensive extending between the toes to the plantar surface Left lateral calf really no open surface on the inferior part of his original wound however the entire area still looks vulnerable Absolutely no improvement in the left buttock wound required debridement. 2/8; the left foot is about the same. Erythema is slightly improved I gave him clindamycin last week. Right foot looks better he is using Lotrimin and silver alginate He has [name] breakdown in the left lateral calf. Denuded epithelium which I have removed Left buttock about the same were using Hydrofera Blue 2/15; left foot is about the same there is less surrounding erythema. Surface still has tightly adherent debris which I have debriding however not making any progress Right foot has [name] substantial wound on the medial right second toe between the first and second webspace. Still an open area on the left lateral calf distal area. Buttock wound is about the same 2/22; left foot is about the same  less surrounding erythema. Surface has adherent debris. Polymen Ag Right foot area significant wound between the first and second toes. We have been using silver alginate here Left lateral leg polymen Ag at the base of his original venous insufficiency wound Left buttock some improvement here 3/1; Right foot is deteriorating in the first second toe webspace. Larger and more substantial. We have been using silver alginate. Left dorsal foot about the same markedly adherent surface debris using PolyMem Ag Left lateral calf surface debris using PolyMem AG Left buttock is improved again using PolyMem Ag. He is completing his terbinafine. The erythema in the foot seems better. He has been on this for 2 [name] 3/8; no improvement in any wound area in fact he has [name] small open area on the dorsal midfoot which is new this week. He has not gotten his foot x-rays yet 3/15; his x-rays were both negative for osteomyelitis of both feet. No major change in any of his wounds on the extremities however his buttock wounds are better.  We have been using polymen on the buttocks, left lower leg. Iodoflex on the left foot and silver alginate on the right 3/22; arrives in clinic today with the 2 major issues are the improvement in the left dorsal foot wound which for once actually looks healthy with [name] nice healthy wound surface without debridement. Using Iodoflex here. Unfortunately on the left lateral calf which is in the distal part of his original wound he came to the clinic here for there was purulent drainage noted some increased breakdown scattered around the original area and [name] small area proximally. We we are using polymen here will change to silver alginate today. His buttock wound on the left is better and I think the area on the right first second toe webspace is also improved 3/29; left dorsal foot looks better. Using Iodoflex. Left ankle culture from deterioration last time grew [name]. coli, Enterobacter and  Enterococcus. I will give him [name] course of cefdinir although that will not cover Enterococcus. The area on the right foot in the webspace of the first and second toe lateral first toe looks better. The area on his buttock is about healed Vascular appointment is on April 21. This is to look at his venous system vis--vis continued breakdown of the wounds on the left including the left lateral leg and left dorsal foot he. He has had previous ablations on this side 4/5; the area between the right first and second toes lateral aspect of the first toe looks better. Dorsal aspect of the left first toe on the left foot also improved. Unfortunately the left lateral lower leg is larger and there is [name] second satellite wound superiorly. The usual superficial abrasions on the left buttock overall better but certainly not closed Bitton, Audi [name] ([id number]) [phone].pdf Page 13 of 38 4/12; the area between the right first and second toes is improved. Dorsal aspect of the left foot also slightly smaller with [name] vibrant healthy looking surface. No real change in the left lateral leg and the left buttock wound is healed He has an unaffordable co-pay for Apligraf. Appointment with vein and vascular with regards to the left leg venous part of the circulation is on 4/21 4/19; we continue to see improvement in all wound areas. Although this is minor. He has his vascular appointment on 4/21. The area on the left buttock has not reopened although right in the center of this area the skin looks somewhat threatened 4/26; the left buttock is unfortunately reopened. In general his left dorsal foot has [name] healthy surface and looks somewhat smaller although it was not measured as such. The area between his first and second toe webspace on the right as [name] small wound against the first toe. The patient saw vascular surgery. The real question I was asking was about the small saphenous vein on the left. He has  previously ablated left greater saphenous vein. Nothing further was commented on on the left. Right greater saphenous vein without reflux at the saphenofemoral junction or proximal thigh there was no indication for ablation of the right greater saphenous vein duplex was negative for DVT bilaterally. They did not think there was anything from [name] vascular surgery point of view that could be offered. They ABIs within normal limits 5/3; only small open area on the left buttock. The area on the left lateral leg which was his original venous reflux is now 2 wounds both which look clean. We are using Iodoflex on the left dorsal foot which looks  healthy and smaller. He is down to [name] very tiny area between the right first and second toes, using silver alginate 5/10; all of his wounds appear better. We have much better edema control in 4 layer compression on the left. This may be the factor that is allowing the left foot and left lateral calf to heal. He has external compression garments at home 04/14/20-All of his wounds are progressing well, the left forefoot is practically closed, left ischium appears to be about the same, right toe webspace is also smaller. The left lateral leg is about the same, continue using Hydrofera Blue to this, silver alginate to the ischium, Iodoflex to the toe space on the right 6/7; most of his wounds outside of the left buttock are doing well. The area on the left lateral calf and left dorsal foot are smaller. The area on the right foot in between the first and second toe webspace is barely visible although he still says there is some drainage here is the only reason I did not heal this out. Unfortunately the area on the left buttock almost looks like he has [name] skin tear from tape. He has open wound and then [name] large flap of skin that we are trying to get adherence over an area just next to the remaining wound 6/21; 2 week follow-up. I believe is been here for nurse visits.  Miraculously the area between his first and second toes on the left dorsal foot is closed over. Still open on the right first second web space. The left lateral calf has 2 open areas. Distally this is more superficial. The proximal area had [name] little more depth and required debridement of adherent necrotic material. His buttock wound is actually larger we have been using silver alginate here 6/28; the patient's area on the left foot remains closed. Still open wet area between the first and second toes on the right and also extending into the plantar aspect. We have been using silver alginate in this location. He has 2 areas on the left lower leg part of his original long wounds which I think are better. We have been using Hydrofera Blue here. Hydrofera Blue to the left buttock which is stable 7/12; left foot remains closed. Left ankle is closed. May be [name] small area between his right first and second toes the only truly open area is on the left buttock. We have been using Hydrofera Blue here 7/19; patient arrives with marked deterioration especially in the left foot and ankle. We did not put him in [name] compression wrap on the left last week in fact he wore his juxta lite stockings on either side although he does not have an underlying stocking. He has [name] reopening on the left dorsal foot, left lateral ankle and [name] new area on the right dorsal ankle. More worrisome is the degree of erythema on the left foot extending on the lateral foot into the lateral lower leg on the left 7/26; the patient had erythema and drainage from the lateral left ankle last week. Culture of this grew MRSA resistant to doxycycline and clindamycin which are the 2 antibiotics we usually use with this patient who has multiple antibiotic allergies including linezolid, trimethoprim sulfamethoxazole. I had give him an empiric doxycycline and he comes in the area certainly looks somewhat better although it is blotchy in his lower leg. He has  not been systemically unwell. He has had areas on the left dorsal foot which is [name] reopening, chronic wounds  on the left lateral ankle. Both of these I think are secondary to chronic venous insufficiency. The area between his first and second toes is closed as far as I can tell. He had [name] new wrap injury on the right dorsal ankle last week. Finally he has an area on the left buttock. We have been using silver alginate to everything except the left buttock we are using Hydrofera Blue 06/30/20-Patient returns at 1 week, has been given [name] sample dose pack of NUZYRA which is [name] tetracycline derivative [omadacycline], patient has completed those, we have been using silver alginate to almost all the wounds except the left ischium where we are using Hydrofera Blue all of them look better 8/16; since I last saw the patient he has been doing well. The area on the left buttock, left lateral ankle and left foot are all closed today. He has completed the [place] I gave him last time and tolerated this well. He still has open areas on the right dorsal ankle and in the right first second toe area which we are using silver alginate. 8/23; we put him in his bilateral external compression stockings last week as he did not have anything open on either leg except for concerning area between the right first and second toe. He comes in today with an area on the left dorsal foot slightly more proximal than the original wound, the left lateral foot but this is actually [name] continuation of the area he had on the left lateral ankle from last time. As well he is opened up on the left buttock again. 8/30; comes in today with things looking [name] lot better. The area on the left lower ankle has closed down as has the left foot but with eschar in both areas. The area on the dorsal right ankle is also epithelialized. Very little remaining of the left buttock wound. We have been using silver alginate on all wound areas 9/13; the area in the  first second toe webspace on the right has fully epithelialized. He still has some vulnerable epithelium on the right and the ankle and the dorsal foot. He notes weeping. He is using his juxta lite stocking. On the left again the left dorsal foot is closed left lateral ankle is closed. We went to the juxta lite stocking here as well. Still vulnerable in the left buttock although only 2 small open areas remain here 9/27; 2-week follow-up. We did not look at his left leg but the patient says everything is closed. He is [name] bit disturbed by the amount of edema in his left foot he is using juxta lite stockings but asking about over the toes stockings which would be 30/40, will talk to him next time. According to him there is no open wound on either the left foot or the left ankle/calf He has an open area on the dorsal right calf which I initially point [name] wrap injury. He has superficial remaining wound on the left ischial tuberosity been using silver alginate although he says this sticks to the wound 10/5; we gave him 2-week follow-up but he called yesterday expressing some concerns about his right foot right ankle and the left buttock. He came in early. There is still no open areas on the left leg and that still in his juxta lite stocking 10/11; he only has 1 small area on the left buttock that remains measuring millimeters 1 mm. Still has the same irritated skin in this area. We recommended zinc oxide when  this eventually closes and pressure relief is meticulously is he can do this. He still has an area on the dorsal part of his right first through third toes which is [name] bit irritated and still open and on the dorsal ankle near the crease of the ankle. We have been using silver alginate and using his own stocking. He has nothing open on the left leg or foot 10/25; 2-week follow-up. Not nearly as good on the left buttock as I was hoping. For open areas with 5 looking threatened small. He has the  erythematous irritated chronic skin in this area. 1 area on the right dorsal ankle. He reports this area bleeds easily Right dorsal foot just proximal to the base of his toes We have been using silver alginate. 11/8; 2-week follow-up. Left buttock is about the same although I do not think the wounds are in the same location we have been using silver alginate. I have asked him to use zinc oxide on the skin around the wounds. He still has [name] small area on the right dorsal ankle he reports this bleeds easily Right dorsal foot just proximal to the base of the toes does not have anything open although the skin is very dry and scaly He has [name] new opening on the nailbed of the left great toe. Nothing on the left ankle 11/29; 3-week follow-up. Left buttock has 2 open areas. And washing of these wounds today started bleeding easily. Suggesting very friable tissue. We have been using silver alginate. Right dorsal ankle which I thought was initially [name] wrap injury we have been using silver alginate. Nothing open between the toes that I can see. He states the area on the left dorsal toe nailbed healed after the last visit in 2 or 3 days 12/13; 3-week follow-up. His left buttock now has 3 open areas but the original 2 areas are smaller using polymen here. Surrounding skin looks better. The right dorsal ankle is closed. He has [name] small opening on the right dorsal foot at the level of the third toe. In general the skin looks better here. He is wearing his juxta lite stocking on the left leg says there is nothing open 11/24/2020; 3 [name] follow-up. His left buttock still has the 3 open areas. We have been using polymen but due to lack of response he changed to [facility] area. Surrounding skin is dry erythematous and irritated looking. There is no evidence of infection either bacterial or fungal however there is loss of surface epithelium He still has very dry skin in his foot causing irritation and erythema on the  dorsal part of his toes. This is not responded to prolonged courses of antifungal simply looks dry and irritated 1/24; left buttock area still looks about the same he was unable to find the triad ointment that we had suggested. The area on the right lower leg just above the dorsal ankle has reopened and the areas on the right foot between the first second and second third toes and scaling on the bottom of the foot has been about Calbert, Jartavious [name] ([id number]) [phone].pdf Page 14 of 38 the same for quite some time now. been using silver alginate to all wound areas 2/7; left buttock wound looked quite good although not much smaller in terms of surface area surrounding skin looks better. Only [name] few dry flaking areas on the right foot in between the first and second toes the skin generally looks better here [ammonium lactate]. Finally the area  on the right dorsal ankle is closed 2/21; There is no open area on the right foot even between the right first and second toe. Skin around this area dorsally and plantar aspects look better. He has [name] reopening of the area on the right ankle just above the crease of the ankle dorsally. I continue to think that this is probably friction from spasms may be even this time with his stocking under the compression stockings. Wounds on his left buttock look about the same there [name] couple of areas that have reopened. He has [name] total square area of loss of epithelialization. This does not look like infection it looks like [name] contact dermatitis but I just cannot determine to what 3/14; there is nothing on the right foot between the first and second toes this was carefully inspected under illumination. Some chronic irritation on the dorsal part of his foot from toes 1-3 at the base. Nothing really open here substantially. Still has an area on the right foot/ankle that is actually larger and hyper granulated. His buttock area on the left is just about closed  however he has chronic inflammation with loss of the surface epithelial layer 3/28; 2-week follow-up. In clinic today with [name] new wound on the left anterior mid tibia. Says this happened about 2 [name] ago. He is not really sure how wonders about the spasticity of his legs at night whether that could have caused this other than that he does not have [name] good idea. He has been using topical antibiotics and silver alginate. The area on his right dorsal ankle seems somewhat better. Finally everything on his left buttock is closed. 4/11; 2-week follow-up. All of his wounds are better except for the area over the ischium and left buttock which have opened up widely again. At least part of this is covered in necrotic fibrinous material another part had rolled nonviable skin. The area on the right ankle, left anterior mid tibia are both [name] lot better. He had no open wounds on either foot including the areas between the first and second toes 4/25; patient presents for 2-week follow-up. He states that the wounds are overall stable. He has no complaints today and states he is using Hydrofera Blue to open wounds. 5/9; have not seen this man in over [name] month. For my memory he has open areas on the left mid tibia and right ankle. T oday he has new open area on the right dorsal foot which we have not had [name] problem with recently. He has the sustained area on the left buttock He is also changed his insurance at the beginning of the year [employer]. We will need prior authorizations for debridement 5/23; patient presents for 2-week follow-up. He has prior authorizations for debridement. He denies any issues in the past 2 [name] with his wound care. He has been using Hydrofera Blue to all the wounds. He does report [name] circular rash to the upper left leg that is new. He denies acute signs of infection. 6/6; 2-week follow-up. The patient has open wounds on the left buttock which are worse than the last time I saw this about  [name] month ago. He also has [name] new area to me on the left anterior mid tibia with some surrounding erythema. The area on the dorsal ankle on the right is closed but I think this will be [name] friction injury every time this area is exposed to either our wraps or his compression stockings caused by unrelenting spasms  in this leg. 6/20; 2-week follow-up. The patient has open wounds on the left buttock which is about the same. Using [facility] here. - The left mid tibia has [name] static amount of surrounding erythema. Also [name] raised area in the center. We have been using Hydrofera Blue here. Finally he has broken down in his dorsal right foot extending between the first and second toes and going to the base of the first and second toe webspace. I have previously assumed that this was severe venous hypertension 7/5; 2-week follow-up The left buttock wound actually looks better. We are using Hydrofera Blue. He has extensive skin irritation around this area and I have not really been able to get that any better. I have tried Lotrisone i.[name]. antifungals and steroids. More most recently we have just been using Coloplast really looks about the same. The left mid tibia which was new last week culture to have very resistant staph aureus. Not only methicillin-resistant but doxycycline resistant. The patient has [name] plethora of antibiotic allergies including sulfa, linezolid. I used topical bacitracin on this but he has not started this yet. In addition he has an expanding area of erythema with [name] wound on the dorsal right foot. I did [name] deep tissue culture of this area today 7/12; Left buttock area actually looks better surrounding skin also looks less irritated. Left mid tibia looks about the same. He is using bacitracin this is not worse Right dorsal foot looks about the same as well. The left first toe also looks about the same 7/19; left buttock wound continues to improve in terms of open areas Left mid tibia is still  concerning amount of swelling he is using bacitracin Dorsal left first toe somewhat smaller Right dorsal foot somewhat smaller 7/25; left buttock wound actually continues to improve Left mid tibia area has less swelling. I gave him all my samples of new Nuzyra. This seems to have helped although the wound is still open it. His abrasion closed by here Left dorsal great toe really no better. Still [name] very nonviable surface Right dorsal foot perhaps some better. We have been using bacitracin and silver nitrate to the areas on his lower legs and Hydrofera Blue to the area on the buttock. 8/16 Disappointed that his left buttock wound is actually more substantial. Apparently during the last nurse visit these were both very small. He has continued irritation to [name] large area of skin on his buttock. I have never been able to totally explain this although I think it some combination of the way he sits, pressure, moisture. He is not incontinent enough to contribute to this. Left dorsal great toe still fibrinous debris on the surface that I have debrided today Large area across the dorsal right toes. The area on the left anterior mid tibia has less swelling. He completed the [place]. This does not look infected although the tissue is still fried 8/30; 2-week follow-up. Left buttock areas not improved. We used Hydrofera Blue on this. Weeping wet with the surrounding erythema that I have not been able to control even with Lotrisone and topical Coloplast Left dorsal great toe looks about the same More substantial area again at the base of his toes on the left which is new this week. Area across the dorsal right toes looks improved The left anterior mid tibia looks like it is trying to close 9/13; 2-week follow-up. Using silver alginate on all of his wounds. The left dorsal foot does not look any better. He  has the area on the dorsal toe and also the areas at the base of all of his toes 1 through 3. On the  right foot he has [name] similar pattern in [name] similar area. He has the area on his left mid tibia that looks fairly healthy. Finally the area on his left buttock looks somewhat bette 9/20; culture I did of the left foot which was [name] deep tissue culture last time showed [name]. coli he has erythema around this wound. Still [name] completely necrotic surface. His right dorsal foot looks about the same. He has [name] very friable surface to the left anterior mid tibia. Both buttock wounds look better. We have been using silver alginate to all wounds 10/4; he has completed the cephalexin that I gave him last time for the left foot. He is using topical gentamicin under silver alginate silver alginate being applied to all the wounds. Unfortunately all the wounds look irritated on his dorsal right foot dorsal left foot left mid tibia. I wonder if this could be [name] silver allergy. I am going to change him to [facility] on the lower extremity. The skin on the left buttock and left posterior thigh still flaking dry and irritated. This has continued no matter what I have applied topically to this. He has [name] solitary open wound which by itself does not look too bad however the entire area of surrounding skin does not change no matter what we have applied here 10/18; the area on the left dorsal foot and right dorsal foot both look better. The area on the right extends into the plantar but not between his toes. We have been using silver alginate. He still has [name] rectangular erythematous area around the area on the left tibia. The wound itself is very small. Finally everything on his left buttock looks [name] little larger the skin is erythematous 11/15; patient comes in with the left dorsal and right dorsal foot distally looking somewhat better. Still nonviable surface on the left foot which required debridement. He still has the area on the left anterior mid tibia although this looks somewhat better. He has [name] new area on the right lateral  lower leg just above the ankle. Finally his left buttock looks terrible with multiple superficial open wounds geometric square shaped area of chronic erythema which I have not been Mulvey, Jairen [name] ([id number]) [phone].pdf Page 15 of 38 able to sort through 11/29; right dorsal foot and left dorsal foot both look somewhat better. No debridement required. He has the fragile area on the left anterior mid tibia this looks and continues to look somewhat better. Right lateral lower leg just above the ankle we identified last time also looks better. In general the area on his left buttock looks improved. We are using Hydrofera Blue to all wound areas 12/13; right dorsal foot looks better. The area on the right lateral leg is healed. Left dorsal foot has 2 open areas both of which require debridement. The fragile area on the left anterior mid tibial looks better. Smaller area on his buttocks. Were using Hydrofera Blue 1/10; patient comes in with everything looking slightly larger and/or worse. This includes his left buttock, reopening of the left mid tibia, larger areas on the left dorsal foot and what looks to be [name] cellulitis on the right dorsal and plantar foot. We have been using Hydrofera Blue on all wounds. 1/17; right dorsal foot distally looks better today. The left foot has 2 open wounds that are about  the same surrounding erythema. 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 No malignancy they did not stain for fungus 1/24; his right distal foot is not closed dry and scaly but the wound looks like it is contracting. I did not debride anything here. Problem on his left dorsal foot with expanding erythema. Apparently there were problems last week getting the Elesa Hacker however it is now available at the [facility] but [name] week later. He is  using ketoconazole and Coloplast to the left buttock along with Hydrofera Blue this actually looks quite [name] bit better today. 1/31; right dorsal foot again is dry and scaly but looking to contract. He has been using [name] moisturizer on his feet at my request but he is not sure which 1. The left dorsal foot wounds look about the same there is erythema here that I marked last week however after course of Nuzyra it certainly is not any better but not any worse either. Finally on his left buttock the skin continues to look better he has the original wound but [name] new substantial area towards the gluteal cleft. Almost like [name] skin tear. I used scissors to remove skin and subcutaneous tissue here silver nitrate and direct pressure 2/7; right dorsal foot. This does not look too much different from last week. Some erythema skin dry and scaly. No debridement. Left dorsal great toe again still not much improvement. I did remove flaking dry skin and callus from around the edge. Finally on his left buttock. The skin is somewhat better in the periwound. Surface wounds are superficial somewhat better than last week. 01/26/2022: Is [name] little bit of [name] mystery as to why his wounds fail to respond to treatments and actually seem to get worse. This is my first encounter with this patient. He was previously followed by Dr. Dellia Nims. Based upon my review of the chart, it seems that there is [name] little bit of [name] mystery as to why his wounds do not respond as anticipated to the interventions applied and sometimes even get worse. Biopsies have been performed and he was seen by dermatology in [place], but that did not shed any light on the matter. T oday, his gluteal wound is larger, with substantial drainage, rather malodorous. The food wounds are not terrible, but he has [name] lot of callus and scaly skin around these. He is currently getting silver alginate on the gluteal wound, with idodoflex to the feet. He is using lotrisone on his legs  for the dry, scaly skin. 02/09/2022: There has really been no change to any of his wounds. The gluteal wound less drainage and odor, but remains about the same size, the periwound skin remains oddly scaly. His lower extremity wounds also appear roughly the same size. They continue to accumulate [name] small amount of slough. The periwound on his feet and ankle wounds has dry eschar and loose dead skin. We have been using silver alginate on the gluteal wound and Iodoflex on his feet and ankle wounds. T the periwound around his gluteal lesion and Lotrisone on his feet and legs. o 02/23/2022: The right plantar foot wound is closed. The gluteal site looks small but has continued to produce hypertrophic granulation tissue. The foot wounds all look about the same on the dorsal surface of the right foot; on the left, there is only [name] small open area at the site of where his  left great toenail would have been. 03/16/2022: The right ankle wound is healed. The right dorsal foot wound is about the same. The left dorsal foot wound is quite [name] bit smaller and the ischial wound is nearly closed. 03/30/2022: The right ankle wound reopened. Both dorsal foot wounds are quite [name] bit smaller. Unfortunately, he appears to have sheared part of his ischial wound open further, perhaps during [name] transfer. 04/13/2022: The right ankle wound has hypertrophic granulation tissue present. The dorsal foot wounds continue to decrease in size. The ischial wound looks about the same today, no better, no worse. 04/27/2022: The right ankle wound has closed. Unfortunately, it looks like some moisture got underneath the dry skin on both of his dorsal feet and these wounds have expanded in size. The ischial wound remains the same with perhaps [name] little bit more slough accumulation than at our previous visit. 05/11/2022: The right ankle wound remains closed. There is [name] left anterior tibial wound that is small has patchy openings with accumulated slough. The  dorsum of his right foot appears to be nearly healed with just [name] small punctate opening. The plantar surface of his right foot has [name] new opening that looks like he may have picked some skin there. His sacral ulcer has hypertrophic granulation tissue but has some slough accumulation. The dorsum of his left foot has multiple open areas in [name] fairly ragged distribution. All of these have slough accumulated within them. 06/01/2022: The right ankle and left anterior tibial wound are both closed. Dorsum of his right foot and left foot both look substantially better with just tiny scattered openings The without any slough accumulation. He has sheared open new areas on his left gluteus and ischium. He says that his wheelchair cushion, which is air-filled, has [name] leak and so it keeps deflating. He is awaiting [name] new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 In spite of that his leg actually looks somewhat better. There is still copious drainage and some erythema but it is generally better. The oral options that were obvious including Zyvox and  sulfonamides he has rash issues both of these. This is sensitive to rifampin but this is not usually used along gentamicin but this is parenteral and again not used along. The obvious alternative is vancomycin. He has had his arterial studies. [name] T brachial index was 1.3 oe on the right. His waveforms were biphasic bilaterally. Doppler waveforms of the digit were  normal in the right damp and on the left. Comment that this could've been due to extreme edema. His venous studies show reflux on both sides in the femoral popliteal veins as well as the greater and lesser saphenous veins bilaterally. Ultimately he is going to need to see vascular surgery about this issue. Hopefully when we can get his wounds and [name] little better shape. 02/19/16; the patient was able to complete [name] course of [facility]'s for MRSA in the face of multiple antibiotic allergies. Arterial studies showed an ABI of him 0.88 on the right 1.17 on the left the. Waveforms were biphasic at the posterior tibial and dorsalis pedis digital waveforms were normal. Right toe brachial index was 1.3 limited by shaking and edema. His venous study showed widespread reflux in the left at the common femoral vein the greater and lesser saphenous vein the greater and lesser saphenous vein on the right as well as the popliteal and femoral vein. The popliteal and femoral vein on the left did not show reflux. His wounds on the right leg give healed on the left he is still using Santyl. 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 The erythema has not really resolved and I wonder if this is mostly venous inflammation rather than cellulitis. Still using Santyl. He is approved for Apligraf 03/04/16; there is less erythema around the wound. Both wounds require aggressive surgical debridement. Not yet ready for Apligraf 03/11/16; aggressive debridement again. Not ready  for Apligraf 03/18/16 aggressive debridement again. 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 Has been to see vascular surgery he is being planned for [name] venous ablation 03/25/16; aggressive debridement again of both wound areas on the left lateral leg. He is due for ablation surgery on May 22. He is much closer to being ready for an Apligraf. Has [name] new area between the left first and second toes 04/01/16 aggressive debridement done of both wounds. The new wound at the base of between his second and first toes looks stable 04/08/16; continued aggressive debridement of both wounds on the left lower leg. He goes for his venous ablation on Monday. The new wound at the base of his first and second toes dorsally appears stable. 04/15/16; wounds aggressively debridement although the base of this looks considerably better Apligraf #1. He had ablation surgery on Monday I'll need to research these records. We only have approval for four Apligraf's 04/22/16; the patient is here for [name] wound check [Apligraf last week] intake nurse concerned about erythema around the wounds. Apparently [name] significant degree of drainage. The patient has chronic venous inflammation which I think accounts for most of this however I was asked to look at this today 04/26/16; the patient came back for check of possible cellulitis in his left foot however the Apligraf dressing was inadvertently removed therefore we elected to prep the wound for [name] second Apligraf. I put him on doxycycline on 6/1 the erythema in the foot 05/03/16 we did not remove the dressing from the superior wound as this is where I put all of his last Apligraf. Surface debridement done with [name] curette of the lower wound which looks very healthy. The area on the left foot also looks quite satisfactory at the dorsal artery at the first and second toes 05/10/16; continue Apligraf to this. Her wound, Hydrafera to the lower wound. He has [name] new area on the right second toe. Left  dorsal foot firstoosecond toe also looks improved 05/24/16;  wound dimensions must be smaller I was able to use Apligraf to all 3 remaining wound areas. 06/07/16 patient's last Apligraf was 2 [name] ago. He arrives today with the 2 wounds on his lateral left leg joined together. This would have to be seen as [name] negative. He also has [name] small wound in his first and second toe on the left dorsally with quite [name] bit of surrounding erythema in the first second and third toes. This looks to be infected or inflamed, very difficult clinical call. 06/21/16: lateral left leg combined wounds. Adherent surface slough area on the left dorsal foot at roughly the fourth toe looks improved Pingley, Basir [name] ([id number]) [phone].pdf Page 22 of 38 07/12/16; he now has [name] single linear wound on the lateral left leg. This does not look to be [name] lot changed from when I lost saw this. The area on his dorsal left foot looks considerably better however. 08/02/16; no major change in the substantial area on his left lateral leg since last time. We have been using Hydrofera Blue for [name] prolonged period of time now. The area on his left foot is also unchanged from last review 07/19/16; the area on his dorsal foot on the left looks considerably smaller. He is beginning to have significant rims of epithelialization on the lateral left leg wound. This also looks better. 08/05/16; the patient came in for [name] nurse visit today. Apparently the area on his left lateral leg looks better and it was wrapped. However in general discussion the patient noted [name] new area on the dorsal aspect of his right second toe. The exact etiology of this is unclear but likely relates to pressure. 08/09/16 really the area on the left lateral leg did not really look that healthy today perhaps slightly larger and measurements. The area on his dorsal right second toe is improved also the left foot wound looks stable to improved 08/16/16; the area on  the last lateral leg did not change any of dimensions. Post debridement with [name] curet the area looked better. Left foot wound improved and the area on the dorsal right second toe is improved 08/23/16; the area on the left lateral leg may be slightly smaller both in terms of length and width. Aggressive debridement with [name] curette afterwards the tissue appears healthier. Left foot wound appears improved in the area on the dorsal right second toe is improved 08/30/16 patient developed [name] fever over the weekend and was seen in an urgent care. Felt to have [name] UTI and put on doxycycline. He has been since changed over the phone to [facility]. After we took off the wrap on his right leg today the leg is swollen warm and erythematous, probably more likely the source of the fever 09/06/16; have been using collagen to the major left leg wound, silver alginate to the area on his anterior foot/toes 09/13/16; the areas on his anterior foot/toes on both sides appear to be virtually closed. Extensive wound on the left lateral leg perhaps slightly narrower but each visit still covered an adherent surface slough 09/16/16 patient was in for his usual Thursday nurse visit however the intake nurse noted significant erythema of his dorsal right foot. He is also running [name] low- grade fever and having increasing spasms in the right leg 09/20/16 here for cellulitis involving his right great toes and forefoot. This is [name] lot better. Still requiring debridement on his left lateral leg. Santyl direct says he needs prior authorization. Therefore his wife cannot change this  at home 09/30/16; the patient's extensive area on the left lateral calf and ankle perhaps somewhat better. Using Santyl. The area on the left toes is healed and I think the area on his right dorsal foot is healed as well. There is no cellulitis or venous inflammation involving the right leg. He is going to need compression stockings here. 10/07/16; the patient's  extensive wound on the left lateral calf and ankle does not measure any differently however there appears to be less adherent surface slough using Santyl and aggressive weekly debridements 10/21/16; no major change in the area on the left lateral calf. Still the same measurement still very difficult to debridement adherent slough and nonviable subcutaneous tissue. This is not really been helped by several [name] of Santyl. Previously for 2 [name] I used Iodoflex for [name] short period. [name] prolonged course of Hydrofera Blue didn't really help. I'm not sure why I only used 2 [name] of Iodoflex on this there is no evidence of surrounding infection. He has [name] small area on the right second toe which looks as though it's progressing towards closure 10/28/16; the wounds on his toes appear to be closed. No major change in the left lateral leg wound although the surface looks somewhat better using Iodoflex. He has had previous arterial studies that were normal. He has had reflux studies and is status post ablation although I don't have any exact notes on which vein was ablated. I'll need to check the surgical record 11/04/16; he's had [name] reopening between the first and second toe on the left and right. No major change in the left lateral leg wound. There is what appears to be cellulitis of the left dorsal foot 11/18/16 the patient was hospitalized initially in [place] and then subsequently transferred to [facility] long and was admitted there from 11/09/16 through 11/12/16. He had developed progressive cellulitis on the right leg in spite of the doxycycline I gave him. I'd spoken to the hospitalist in [place] who was concerned about continuing leukocytosis. CT scan is what I suggested this was done which showed soft tissue swelling without evidence of osteomyelitis or an underlying abscess blood cultures were negative. At [facility] he was treated with vancomycin and Primaxin and then add an infectious disease consult. He  was transitioned to Ceftaroline. He has been making progressive improvement. Overall [name] severe cellulitis of the right leg. He is been using silver alginate to her original wound on the left leg. The wounds in his toes on the right are closed there is [name] small open area on the base of the left second toe 11/26/15; the patient's right leg is much better although there is still some edema here this could be reminiscent from his severe cellulitis likely on top of some degree of lymphedema. His left anterior leg wound has less surface slough as reported by her intake nurse. Small wound at the base of the left second toe 12/02/16; patient's right leg is better and there is no open wound here. His left anterior lateral leg wound continues to have [name] healthy-looking surface. Small wound at the base of the left second toe however there is erythema in the left forefoot which is worrisome 12/16/16; is no open wounds on his right leg. We took measurements for stockings. His left anterior lateral leg wound continues to have [name] healthy-looking surface. I'm not sure where we were with the Apligraf run through his insurance. We have been using Iodoflex. He has [name] thick eschar on the left first  second toe interface, I suspect this may be fungal however there is no visible open 12/23/16; no open wound on his right leg. He has 2 small areas left of the linear wound that was remaining last week. We have been using Prisma, I thought I have disclosed this week, we can only look forward to next week 01/03/17; the patient had concerning areas of erythema last week, already on doxycycline for UTI through his primary doctor. The erythema is absolutely no better there is warmth and swelling both medially from the left lateral leg wound and also the dorsal left foot. 01/06/17- Patient is here for follow-up evaluation of his left lateral leg ulcer and bilateral feet ulcers. He is on oral antibiotic therapy, tolerating that. Nursing staff and  the patient states that the erythema is improved from Monday. 01/13/17; the predominant left lateral leg wound continues to be problematic. I had put Apligraf on him earlier this month once. However he subsequently developed what appeared to be an intense cellulitis around the left lateral leg wound. I gave him Dalvance I think on 2/12 perhaps 2/13 he continues on cefdinir. The erythema is still present but the warmth and swelling is improved. I am hopeful that the cellulitis part of this control. I wouldn't be surprised if there is an element of venous inflammation as well. 01/17/17. The erythema is present but better in the left leg. His left lateral leg wound still does not have [name] viable surface buttons certain parts of this long thin wound it appears like there has been improvement in dimensions. 01/20/17; the erythema still present but much better in the left leg. I'm thinking this is his usual degree of chronic venous inflammation. The wound on the left leg looks somewhat better. Is less surface slough 01/27/17; erythema is back to the chronic venous inflammation. The wound on the left leg is somewhat better. I am back to the point where I like to try an Apligraf once again 02/10/17; slight improvement in wound dimensions. Apligraf #2. He is completing his doxycycline 02/14/17; patient arrives today having completed doxycycline last Thursday. This was supposed to be [name] nurse visit however once again he hasn't tense erythema from the medial part of his wound extending over the lower leg. Also erythema in his foot this is roughly in the same distribution as last time. He has baseline chronic venous inflammation however this is [name] lot worse than the baseline I have learned to accept the on him is baseline inflammation 02/24/17- patient is here for follow-up evaluation. He is tolerating compression therapy. His voicing no complaints or concerns he is here anticipating an Apligraf 03/03/17; he arrives today with  an adherent necrotic surface. I don't think this is surface is going to be amenable for Apligraf's. The erythema around his wound and on the left dorsal foot has resolved he is off antibiotics 03/10/17; better-looking surface today. I don't think he can tolerate Apligraf's. He tells me he had [name] wound VAC after [name] skin graft years ago to this area and they had difficulty with [name] seal. The erythema continues to be stable around this some degree of chronic venous inflammation but he also has recurrent cellulitis. We have been using Iodoflex 03/17/17; continued improvement in the surface and may be small changes in dimensions. Using Iodoflex which seems the only thing that will control his surface 03/24/17- He is here for follow up evaluation of his LLE lateral ulceration and ulcer to right dorsal foot/toe space. He is voicing  no complaints or concerns, He is tolerating compression wrap. 03/31/17 arrives today with [name] much healthier looking wound on the left lower extremity. We have been using Iodoflex for [name] prolonged period of time which has for the first time prepared and adequate looking wound bed although we have not had much in the way of wound dimension improvement. He also has [name] small wound between the first and second toe on the right 04/07/17; arrives today with [name] healthy-looking wound bed and at least the top 50% of this wound appears to be now her. No debridement was required I have changed him to [facility] last week after prolonged Iodoflex. He did not do well with Apligraf's. We've had [name] re-opening between the first and second toe on the right 04/14/17; arrives today with [name] healthier looking wound bed contractions and the top 50% of this wound and some on the lesser 50%. Wound bed appears healthy. Cueva, Treyvin [name] ([id number]) [phone].pdf Page 23 of 38 The area between the first and second toe on the right still remains problematic 04/21/17; continued very gradual  improvement. Using [facility] 04/28/17; continued very gradual improvement in the left lateral leg venous insufficiency wound. His periwound erythema is very mild. We have been using Hydrofera Blue. Wound is making progress especially in the superior 50% 05/05/17; he continues to have very gradual improvement in the left lateral venous insufficiency wound. Both in terms with an length rings are improving. I debrided this every 2 [name] with #5 curet and we have been using Hydrofera Blue and again making good progress With regards to the wounds between his right first and second toe which I thought might of been tinea pedis he is not making as much progress very dry scaly skin over the area. Also the area at the base of the left first and second toe in [name] similar condition 05/12/17; continued gradual improvement in the refractory left lateral venous insufficiency wound on the left. Dimension smaller. Surface still requiring debridement using Hydrofera Blue 05/19/17; continued gradual improvement in the refractory left lateral venous ulceration. Careful inspection of the wound bed underlying rumination suggested some degree of epithelialization over the surface no debridement indicated. Continue Hydrofera Blue difficult areas between his toes first and third on the left than first and second on the right. I'm going to change to silver alginate from silver collagen. Continue ketoconazole as I suspect underlying tinea pedis 05/26/17; left lateral leg venous insufficiency wound. We've been using Hydrofera Blue. I believe that there is expanding epithelialization over the surface of the wound albeit not coming from the wound circumference. This is [name] bit of an odd situation in which the epithelialization seems to be coming from the surface of the wound rather than in the exact circumference. There is still small open areas mostly along the lateral margin of the wound. ooHe has unchanged areas between the left  first and second and the right first second toes which I been treating for tenia pedis 06/02/17; left lateral leg venous insufficiency wound. We have been using Hydrofera Blue. Somewhat smaller from the wound circumference. The surface of the wound remains [name] bit on it almost epithelialized sedation in appearance. I use an open curette today debridement in the surface of all of this especially the edges ooSmall open wounds remaining on the dorsal right first and second toe interspace and the plantar left first second toe and her face on the left 06/09/17; wound on the left lateral leg continues to be smaller  but very gradual and very dry surface using Hydrofera Blue 06/16/17 requires weekly debridements now on the left lateral leg although this continues to contract. I changed to silver collagen last week because of dryness of the wound bed. Using Iodoflex to the areas on his first and second toes/web space bilaterally 06/24/17; patient with history of paraplegia also chronic venous insufficiency with lymphedema. Has [name] very difficult wound on the left lateral leg. This has been gradually reducing in terms of with but comes in with [name] very dry adherent surface. High switch to silver collagen [name] week or so ago with hydrogel to keep the area moist. This is been refractory to multiple dressing attempts. He also has areas in his first and second toes bilaterally in the anterior and posterior web space. I had been using Iodoflex here after [name] prolonged course of silver alginate with ketoconazole was ineffective [question tinea pedis] 07/14/17; patient arrives today with [name] very difficult adherent material over his left lateral lower leg wound. He also has surrounding erythema and poorly controlled edema. He was switched his Santyl last visit which the nurses are applying once during his doctor visit and once on [name] nurse visit. He was also reduced to 2 layer compression I'm not exactly sure of the issue here. 07/21/17;  better surface today after 1 week of Iodoflex. Significant cellulitis that we treated last week also better. [Doxycycline] 07/28/17 better surface today with now 2 [name] of Iodoflex. Significant cellulitis treated with doxycycline. He has now completed the doxycycline and he is back to his usual degree of chronic venous inflammation/stasis dermatitis. He reminds me he has had ablations surgery here 08/04/17; continued improvement with Iodoflex to the left lateral leg wound in terms of the surface of the wound although the dimensions are better. 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 Problematic areas on the plantar aspect of the first second toe web space on the left and the dorsal aspect of the first second toe web space on the right. At one point I felt these were probably related to chronic fungal infections in treated him aggressively for this although we have not made any improvement here. 08/11/17; left lateral leg. Surface continues to improve with the Iodoflex although we are not seeing much improvement in overall wound dimensions. Areas on his plantar left foot and right foot show no improvement. In fact the right foot looks somewhat worse 08/18/17; left lateral leg. We changed to [facility] Blue last week after [name] prolonged course of Iodoflex which helps get the surface better. It appears that the wound with is improved. Continue with difficult areas on the left dorsal first second and plantar first second on the right 09/01/17; patient arrives in clinic today having had [name] temperature of 103 yesterday. He was seen in the ER and [facility]. The patient was concerned he could have cellulitis again in the right leg however they diagnosed him with [name] UTI and he is now on Keflex. He has [name] history of cellulitis which is been recurrent and difficult but this is been in the left leg, in the past 5 use doxycycline. He does in and out  catheterizations at home which are risk factors for UTI 09/08/17; patient will be completing his Keflex this weekend. The erythema on the left leg is considerably better. He has [name] new wound today on the medial part of the right leg small superficial almost looks like [name] skin tear. He  has worsening of the area on the right dorsal first and second toe. His major area on the left lateral leg is better. Using Hydrofera Blue on all areas 09/15/17; gradual reduction in width on the long wound in the left lateral leg. No debridement required. He also has wounds on the plantar aspect of his left first second toe web space and on the dorsal aspect of the right first second toe web space. 09/22/17; there continues to be very gradual improvements in the dimensions of the left lateral leg wound. He hasn't round erythematous spot with might be pressure on his wheelchair. There is no evidence obviously of infection no purulence no warmth ooHe has [name] dry scaled area on the plantar aspect of the left first second toe ooImproved area on the dorsal right first second toe. 09/29/17; left lateral leg wound continues to improve in dimensions mostly with an is still [name] fairly long but increasingly narrow wound. ooHe has [name] dry scaled area on the plantar aspect of his left first second toe web space ooIncreasingly concerning area on the dorsal right first second toe. In fact I am concerned today about possible cellulitis around this wound. The areas extending up his second toe and although there is deformities here almost appears to abut on the nailbed. 10/06/17; left lateral leg wound continues to make very gradual progress. Tissue culture I did from the right first second toe dorsal foot last time grew MRSA and enterococcus which was vancomycin sensitive. This was not sensitive to clindamycin or doxycycline. He is allergic to Zyvox and sulfa we have therefore arrange for him to have dalvance infusion tomorrow. He is had  this in the past and tolerated it well 10/20/17; left lateral leg wound continues to make decent progress. This is certainly reduced in terms of with there is advancing epithelialization.ooThe cellulitis in the right foot looks better although he still has [name] deep wound in the dorsal aspect of the first second toe web space. Plantar left first toe web space on the left I think is making some progress 10/27/17; left lateral leg wound continues to make decent progress. Advancing epithelialization.using Hydrofera Blue ooThe right first second toe web space wound is better-looking using silver alginate ooImprovement in the left plantar first second toe web space. Again using silver alginate 11/03/17 left lateral leg wound continues to make decent progress albeit slowly. Using Hydrofera Blue ooThe right per second toe web space continues to be [name] very problematic looking punched out wound. I obtained [name] piece of tissue for deep culture I did extensively treated this for fungus. It is difficult to imagine that this is [name] pressure area as the patient states other than going outside he doesn't really wear shoes at home ooThe left plantar first second toe web space looked fairly senescent. Necrotic edges. This required debridement oochange to Hydrofera Blue to all wound areas 11/10/17; left lateral leg wound continues to contract. Using Hydrofera Blue ooOn the right dorsal first second toe web space dorsally. Culture I did of this area last week grew MRSA there is not an easy oral option in this patient was multiple antibiotic allergies or intolerances. This was only [name] rare culture isolate I'm therefore going to use Bactroban under silver alginate ooOn the left plantar first second toe web space. Debridement is required here. This is also unchanged 11/17/17; left lateral leg wound continues to contract using Hydrofera Blue this is no longer the major issue. ooThe major concern here is the right first  second toe web space. He now has an open area going from dorsally to the plantar aspect. There is now wound on the inner lateral part of the first toe. Not [name] very viable surface on this. There is erythema spreading medially into the forefoot. ooNo major change in the left first second toe plantar wound 11/24/17; left lateral leg wound continues to contract using Hydrofera Blue. Nice improvement today ooThe right first second toe web space all of this looks [name] lot less angry than last week. I have given him clindamycin and topical Bactroban for MRSA and terbinafine for the possibility of underlining tinea pedis that I could not control with ketoconazole. Looks somewhat better ooThe area on the plantar left first second toe web space is weeping with dried debris around the wound 12/01/17; left lateral leg wound continues to contract he Hydrofera Blue. It is becoming thinner in terms of with nevertheless it is making good improvement. ooThe right first second toe web space looks less angry but still [name] large necrotic-looking wounds starting on the plantar aspect of the right foot extending between the toes and now extensively on the base of the right second toe. I gave him clindamycin and topical Bactroban for MRSA anterior benefiting for the possibility of underlying tinea pedis. Not looking better today ooThe area on the left first/second toe looks better. Debrided of necrotic debris 12/05/17* the patient was worked in urgently today because over the weekend he found blood on his incontinence bad when he woke up. He was found to have Allende, Trexton [name] ([id number]) [phone].pdf Page 24 of 38 an ulcer by his wife who does most of his wound care. He came in today for [place] to look at this. He has not had [name] history of wounds in his buttocks in spite of his paraplegia. 12/08/17; seen in follow-up today at his usual appointment. He was seen earlier this week and found to have [name] new wound on  his buttock. We also follow him for wounds on the left lateral leg, left first second toe web space and right first second toe web space 12/15/17; we have been using Hydrofera Blue to the left lateral leg which has improved. The right first second toe web space has also improved. Left first second toe web space plantar aspect looks stable. The left buttock has worsened using Santyl. Apparently the buttock has drainage 12/22/17; we have been using Hydrofera Blue to the left lateral leg which continues to improve now 2 small wounds separated by normal skin. He tells [place] he had [name] fever up to 100 yesterday he is prone to UTIs but has not noted anything different. He does in and out catheterizations. The area between the first and second toes today does not look good necrotic surface covered with what looks to be purulent drainage and erythema extending into the third toe. I had gotten this to something that I thought look better last time however it is not look good today. He also has [name] necrotic surface over the buttock wound which is expanded. I thought there might be infection under here so I removed [name] lot of the surface with [name] #5 curet though nothing look like it really needed culturing. He is been using Santyl to this area 12/27/17; his original wound on the left lateral leg continues to improve using Hydrofera Blue. I gave him samples of Baxdella although he was unable to take them out of fear for an allergic reaction ["lump in his throat"].the culture I  did of the purulent drainage from his second toe last week showed both enterococcus and [name] set Enterobacter I was also concerned about the erythema on the bottom of his foot although paradoxically although this looks somewhat better today. 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 We've been using silver alginate here. They came up today that he sleeps in [name]  recliner, I'm not sure why but I asked him to stop this 01/03/18; his original wound we've been using Hydrofera Blue is now separated into 2 areas. ooUlcer on his left buttock is better he is off the recliner and sleeping in bed ooFinally both wound areas between his first and second toes also looks some better 01/10/18; his original wound on the left lateral leg is now separated into 2 wounds we've been using Hydrofera Blue ooUlcer on his left buttock has some drainage. There is [name] small probing site going into muscle layer superiorly.using silver alginate -He arrives today with [name] deep tissue injury on the left heel ooThe wound on the dorsal aspect of his first second toe on the left looks [name] lot betterusing silver alginate ketoconazole ooThe area on the first second toe web space on the right also looks [name] lot bette 01/17/18; his original wound on the left lateral leg continues to progress using Hydrofera Blue ooUlcer on his left buttock also is smaller surface healthier except for [name] small probing site going into the muscle layer superiorly. 2.4 cm of tunneling in this area ooDTI on his left heel we have only been offloading. Looks better than last week no threatened open no evidence of infection oothe wound on the dorsal aspect of the first second toe on the left continues to look like it's regressing we have only been using silver alginate and terbinafine orally ooThe area in the first second toe web space on the right also looks to be [name] lot better using silver alginate and terbinafine I think this was prompted by tinea pedis 01/31/18; the patient was hospitalized in [place] last week apparently for [name] complicated UTI. He was discharged on cefepime he does in and out catheterizations. In the hospital he was discovered M I don't mild elevation of AST and ALT and the terbinafine was stopped.predictably the pressure ulcer on s his buttock looks betterusing silver alginate. The area on the left  lateral leg also is better using Hydrofera Blue. The area between the first and second toes on the left better. First and second toes on the right still substantial but better. Finally the DTI on the left heel has held together and looks like it's resolving 02/07/18-he is here in follow-up evaluation for multiple ulcerations. He has new injury to the lateral aspect of the last issue [name] pressure ulcer, he states this is from adhesive removal trauma. He states he has tried multiple adhesive products with no success. All other ulcers appear stable. The left heel DTI is resolving. We will continue with same treatment plan and follow-up next week. 02/14/18; follow-up for multiple areas. ooHe has [name] new area last week on the lateral aspect of his pressure ulcer more over the posterior trochanter. The original pressure ulcer looks quite stable has healthy granulation. 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 Almost fully epithelialized on the original superior area using Hydrofera Blue ooDTI on the left heel  has peeled off this week to reveal [name] small superficial wound under denuded skin and subcutaneous tissue ooBoth areas between the first and second toes look better including nothing open on the left 02/21/18; ooThe patient's wounds on his left ischial tuberosity and posterior left greater trochanter actually looked better. He has [name] large area of irritation around the area which I think is contact dermatitis. I am doubtful that this is fungal ooHis original wound on the left lateral calf continues to improve we have been using Hydrofera Blue ooThere is no open area in the left first second toe web space although there is [name] lot of thick callus ooThe DTI on the left heel required debridement today of necrotic surface eschar and subcutaneous tissue using silver alginate ooFinally the area on the  right first second toe webspace continues to contract using silver alginate and ketoconazole 02/28/18 ooLeft ischial tuberosity wounds look better using silver alginate. ooOriginal wound on the left calf only has one small open area left using Hydrofera Blue ooDTI on the left heel required debridement mostly removing skin from around this wound surface. Using silver alginate ooThe areas on the right first/second toe web space using silver alginate and ketoconazole 03/08/18 on evaluation today patient appears to be doing decently well as best I can tell in regard to his wounds. This is the first time that I have seen him as he generally is followed by Dr. Dellia Nims. With that being said none of his wounds appear to be infected he does have an area where there is some skin covering what appears to be [name] new wound on the left dorsal surface of his great toe. This is right at the nail bed. With that being said I do believe that debrided away some of the excess skin can be of benefit in this regard. Otherwise he has been tolerating the dressing changes without complication. 03/14/18; patient arrives today with the multiplicity of wounds that we are following. He has not been systemically unwell ooOriginal wound on the left lateral calf now only has 2 small open areas we've been using Hydrofera Blue which should continue ooThe deep tissue injury on the left heel requires debridement today. We've been using silver alginate ooThe left first second toe and the right first second toe are both are reminiscence what I think was tinea pedis. Apparently some of the callus Surface between the toes was removed last week when it started draining. ooPurulent drainage coming from the wound on the ischial tuberosity on the left. 03/21/18-He is here in follow-up evaluation for multiple wounds. 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 He  tolerated debridement. The only change to last week's recommendations is to discontinue antifungal cream between toes. He will follow-up next week 03/28/18; following up for multiple wounds;Concern this week is streaking redness and swelling in the right foot. He is going to need antibiotics for this. 03/31/18; follow-up for right foot cellulitis. Streaking redness and swelling in the right foot on 03/28/18. 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 I put him on clindamycin 300 mg every 6 and brought him in for [name] quick check. He has an open wound between his first and second toes on the right foot as [name] potential source. 04/04/18; ooRight foot cellulitis is resolving he is completing clindamycin. This is truly good news ooLeft lateral calf wound which is initial wound only has one small open area inferiorly this is close  to healing out. He has compression stockings. We will use Hydrofera Blue right down to the epithelialization of this ooNonviable surface on the left heel which was initially pressure with [name] DTI. We've been using Hydrofera Blue. I'm going to switch this back to silver alginate ooLeft first second toe/tinea pedis this looks better using silver alginate ooRight first second toe tinea pedis using silver alginate ooLarge pressure ulcers on theLeft ischial tuberosity. Small wound here Looks better. I am uncertain about the surface over the large wound. Using silver alginate 04/11/18; Gearheart, Garland [name] ([id number]) [phone].pdf Page 25 of 38 ooCellulitis in the right foot is resolved ooLeft lateral calf wound which was his original wounds still has 2 tiny open areas remaining this is just about closed ooNonviable surface on the left heel is better but still requires debridement ooLeft first second toe/tinea pedis still open using silver alginate ooRight first second toe wound tinea pedis I asked him to go back to using ketoconazole and  silver alginate ooLarge pressure ulcers on the left ischial tuberosity this shear injury here is resolved. Wound is smaller. No evidence of infection using silver alginate 04/18/18; ooPatient arrives with an intense area of cellulitis in the right mid lower calf extending into the right heel area. Bright red and warm. Smaller area on the left anterior leg. He has [name] significant history of MRSA. He will definitely need antibioticsoodoxycycline ooHe now has 2 open areas on the left ischial tuberosity the original large wound and now [name] satellite area which I think was above his initial satellite areas. Not [name] wonderful surface on this satellite area surrounding erythema which looks like pressure related. ooHis left lateral calf wound again his original wound is just about closed ooLeft heel pressure injury still requiring debridement ooLeft first second toe looks [name] lot better using silver alginate ooRight first second toe also using silver alginate and ketoconazole cream also looks better 04/20/18; the patient was worked in early today out of concerns with his cellulitis on the right leg. I had started him on doxycycline. This was 2 days ago. His wife was concerned about the swelling in the area. Also concerned about the left buttock. He has not been systemically unwell no fever chills. No nausea vomiting or diarrhea 04/25/18; the patient's left buttock wound is continued to deteriorate he is using Hydrofera Blue. He is still completing clindamycin for the cellulitis on the right leg although all of this looks better. 05/02/18 ooLeft buttock wound still with [name] lot of drainage and [name] very tightly adherent fibrinous necrotic surface. He has [name] deeper area superiorly ooThe left lateral calf wound is still closed ooDTI wound on the left heel necrotic surface especially the circumference using Iodoflex ooAreas between his left first second toe and right first second toe both look better. Dorsally and  the right first second toe he had [name] necrotic surface although at smaller. In using silver alginate and ketoconazole. I did [name] culture last week which was [name] deep tissue culture of the reminiscence of the open wound on the right first second toe dorsally. This grew [name] few Acinetobacter and [name] few methicillin-resistant staph aureus. Nevertheless the area actually this week looked better. I didn't feel the need to specifically address this at least in terms of systemic antibiotics. 05/09/18; wounds are measuring larger more drainage per our intake. We are using Santyl covered with alginate on the large superficial buttock wounds, Iodosorb on the left heel, ketoconazole and silver alginate to the dorsal first and  second toes bilaterally. 05/16/18; ooThe area on his left buttock better in some aspects although the area superiorly over the ischial tuberosity required an extensive debridement.using Santyl ooLeft heel appears stable. Using Iodoflex ooThe areas between his first and second toes are not bad however there is spreading erythema up the dorsal aspect of his left foot this looks like cellulitis again. He is insensate the erythema is really very brilliant.o Erysipelas He went to see an allergist days ago because he was itching part of this he had lab work done. This showed [name] white count of 15.1 with 70% neutrophils. Hemoglobin of 11.4 and [name] platelet count of 659,000. Last white count we had in Epic was [name] 2-1/2 years ago which was 25.9 but he was ill at the time. He was able to show me some lab work that was done by his primary physician the pattern is about the same. 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 But prompted me to go ahead and do x-rays of both feet and the pelvis rule out osteomyelitis. He also had [name] comprehensive metabolic panel this was reasonably normal his albumin was 3.7 liver function tests BUN/creatinine all normal 05/23/18; x-rays of  both his feet from last week were negative for underlying pulmonary abnormality. The x-ray of his pelvis however showed mild irregularity in the left ischial which may represent some early osteomyelitis. The wound in the left ischial continues to get deeper clearly now exposed muscle. Each week necrotic surface material over this area. Whereas the rest of the wounds do not look so bad. ooThe left ischial wound we have been using Santyl and calcium alginate ooT the left heel surface necrotic debris using Iodoflex o ooThe left lateral leg is still healed ooAreas on the left dorsal foot and the right dorsal foot are about the same. There is some inflammation on the left which might represent contact dermatitis, fungal dermatitis I am doubtful cellulitis although this looks better than last week 05/30/18; CT scan done at Hospital did not show any osteomyelitis or abscess. Suggested the possibility of underlying cellulitis although I don't see [name] lot of evidence of this at the bedside ooThe wound itself on the left buttock/upper thigh actually looks somewhat better. No debridement ooLeft heel also looks better no debridement continue Iodoflex ooBoth dorsal first second toe spaces appear better using Lotrisone. Left still required debridement 06/06/18; ooIntake reported some purulent looking drainage from the left gluteal wound. Using Santyl and calcium alginate ooLeft heel looks better although still [name] nonviable surface requiring debridement ooThe left dorsal foot first/second webspace actually expanding and somewhat deeper. I may consider doing [name] shave biopsy of this area ooRight dorsal foot first/second webspace appears stable to improved. Using Lotrisone and silver alginate to both these areas 06/13/18 ooLeft gluteal surface looks better. Now separated in the 2 wounds. No debridement required. Still drainage. We'll continue silver alginate ooLeft heel continues to look better with Iodoflex  continue this for at least another week ooOf his dorsal foot wounds the area on the left still has some depth although it looks better than last week. We've been using Lotrisone and silver alginate 06/20/18 ooLeft gluteal continues to look better healthy tissue ooLeft heel continues to look better healthy granulation wound is smaller. He is using Iodoflex and his long as this continues continue the Iodoflex ooDorsal right foot looks better unfortunately dorsal left foot does not. There is swelling and erythema of his forefoot.  He had minor trauma to this several days ago but doesn't think this was enough to have caused any tissue injury. Foot looks like cellulitis, we have had this problem before 06/27/18 on evaluation today patient appears to be doing [name] little worse in regard to his foot ulcer. 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 Both of which would really be his only options for treating this infection. In the past he has been given and effusion of Orbactiv. This is done very well for him in the past again it's one time dosing IV antibiotic therapy. Subsequently I do believe this is something we're gonna need to see about doing at this point in time. Currently his other wounds seem to be doing somewhat better in my pinion I'm pretty happy in that regard. 07/03/18 on evaluation today patient's wounds actually appear to be doing fairly well. He has been tolerating the dressing changes without complication. All in all he seems to be showing signs of improvement. In regard to the antibiotics he has been dealing with infectious disease since I saw him last week as far as getting this scheduled. In the end [facility]'s [facility] this coming Friday. In the meantime he has been continuing to perform the  dressing changes in such as previous. There does not appear to be any evidence of infection worsengin at this time. 07/10/18; ooSince I last saw this man 2 [name] ago things have actually improved. IV antibiotics of resulted in less forefoot erythema although there is still some present. He is not systemically unwell ooLeft buttock wounds o2 now have no depth there is increased epithelialization Using silver alginate ooLeft heel still requires debridement using Iodoflex ooLeft dorsal foot still with [name] sizable wound about the size of [name] border but healthy granulation ooRight dorsal foot still with [name] slitlike area using silver alginate 07/18/18; the patient's cellulitis in the left foot is improved in fact I think it is on its way to resolving. ooLeft buttock wounds o2 both look better although the larger one has hypertension granulation we've been using silver alginate ooLeft heel has some thick circumferential redundant skin over the wound edge which will need to be removed today we've been using Iodoflex ooLeft dorsal foot is still [name] sizable wound required debridement using silver alginate Laffey, Heywood [name] ([id number].pdf Page 26 of 38 ooThe right dorsal foot is just about closed only [name] small open area remains here 07/25/18; left foot cellulitis is resolved ooLeft buttock wounds o2 both look better. Hyper-granulation on the major area ooLeft heel as some debris over the surface but otherwise looks [name] healthier wound. Using silver collagen ooRight dorsal foot is just about closed 07/31/18; arrives with our intake nurse worried about purulent drainage from the buttock. We had hyper-granulation here last week ooHis buttock wounds o2 continue to look better ooLeft heel some debris over the surface but measuring smaller. ooRight dorsal foot unfortunately has openings between the toes ooLeft foot superficial wound looks less  aggravated. 08/07/18 ooButtock wounds continue to look better although some of her granulation and the larger medial wound. silver alginate ooLeft heel continues to look [name] lot better.silver collagen ooLeft foot superficial wound looks less stable. Requires debridement. He has [name] new wound superficial area on the foot on the lateral dorsal foot.   ooRight foot looks better using silver alginate without Lotrisone 08/14/2018; patient was in the ER last week diagnosed with [name] UTI. He is now on Cefpodoxime and Macrodantin. ooButtock wounds continued to be smaller. Using silver alginate ooLeft heel continues to look better using silver collagen ooLeft foot superficial wound looks as though it is improving ooRight dorsal foot area is just about healed. 08/21/2018; patient is completed his antibiotics for his UTI. ooHe has 2 open areas on the buttocks. There is still not closed although the surface looks satisfactory. Using silver alginate ooLeft heel continues to improve using silver collagen ooThe bilateral dorsal foot areas which are at the base of his first and second toes/possible tinea pedis are actually stable on the left but worse on the right. The area on the left required debridement of necrotic surface. After debridement I obtained [name] specimen for PCR culture. ooThe right dorsal foot which is been just about healed last week is now reopened 08/28/2018; culture done on the left dorsal foot showed coag negative staph both staph epidermidis and Lugdunensis. I think this is worthwhile initiating systemic treatment. I will use doxycycline given his long list of allergies. The area on the left heel slightly improved but still requiring debridement. ooThe large wound on the buttock is just about closed whereas the smaller one is larger. Using silver alginate in this area 09/04/2018; patient is completing his doxycycline for the left foot although this continues to be [name] very difficult wound area  with very adherent necrotic debris. We are using silver alginate to all his wounds right foot left foot and the small wounds on his buttock, silver collagen on the left heel. 09/11/2018; once again this patient has intense erythema and swelling of the left forefoot. Lesser degrees of erythema in the right foot. He has [name] long list of allergies and intolerances. I will reinstitute doxycycline. 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 Using silver alginate ooLeft heel also looks better using silver collagen ooUnfortunately both the areas on his feet look worse. The area on the left first second webspace is now gone through to the plantar part of his foot. The area on the left foot anteriorly is irritated with erythema and swelling in the forefoot. 09/25/2018 ooHis wound on the left plantar heel looks better. Using silver collagen ooThe area on the left buttock 2 small remnant areas. One is closed one is still open. Using silver alginate ooThe areas between both his first and second toes look worse. This in spite of long-standing antifungal therapy with ketoconazole and silver alginate which should have antifungal activity ooHe has small areas around his original wound on the left calf one is on the bottom of the original scar tissue and one superiorly both of these are small and superficial but again given wound history in this site this is worrisome 10/02/2018 ooLeft plantar heel continues to gradually contract using silver collagen ooLeft buttock wound is unchanged using silver alginate ooThe areas on his dorsal feet between his first and second toes bilaterally look about the same. 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 Small open area here. We have been  using silver alginate 10/09/2018; ooLeft plantar heel continues to look healthy and contract. No debridement is required ooLeft buttock slightly smaller with [name]  tape injury wound just below which was new this week ooDorsal feet somewhat improved I have been using clindamycin ooLeft lateral looks lower extremity the actual open area looks worse although [name] lot of this is epithelialized. I am going to change to silver collagen today He has [name] lot more swelling in the right leg although this is not pitting not red and not particularly warm there is [name] lot of spasm in the right leg usually indicative of people with paralysis of some underlying discomfort. We have reviewed his vascular status from 2017 he had [name] left greater saphenous vein ablation. I wonder about referring him back to vascular surgery if the area on the left leg continues to deteriorate. 10/16/2018 in today for follow-up and management of multiple lower extremity ulcers. His left Buttock wound is much lower smaller and almost closed completely. The wound to the left ankle has began to reopen with Epithelialization and some adherent slough. He has multiple new areas to the left foot and leg. The left dorsal foot without much improvement. Wound present between left great webspace and 2nd toe. Erythema and edema present right leg. Right LE ultrasound obtained on 10/10/18 was negative for DVT . 10/23/2018; ooLeft buttock is closed over. Still dry macerated skin but there is no open wound. I suspect this is chronic pressure/moisture ooLeft lateral calf is quite [name] bit worse than when I saw this last. There is clearly drainage here he has macerated skin into the left plantar heel. We will change the primary dressing to alginate ooLeft dorsal foot has some improvement in overall wound area. Still using clindamycin and silver alginate ooRight dorsal foot about the same as the left using clindamycin and silver alginate ooThe erythema in the  right leg has resolved. He is DVT rule out was negative ooLeft heel pressure area required debridement although the wound is smaller and the surface is health 10/26/2018 ooThe patient came back in for his nurse check today predominantly because of the drainage coming out of the left lateral leg with [name] recent reopening of his original wound on the left lateral calf. He comes in today with [name] large amount of surrounding erythema around the wound extending from the calf into the ankle and even in the area on the dorsal foot. He is not systemically unwell. He is not febrile. Nevertheless this looks like cellulitis. We have been using silver alginate to the area. I changed him to [name] regular visit and I am going to prescribe him doxycycline. The rationale here is [name] long list of medication intolerances and [name] history of MRSA. I did not see anything that I thought would provide [name] valuable culture 10/30/2018 ooFollow-up from his appointment 4 days ago with really an extensive area of cellulitis in the left calf left lateral ankle and left dorsal foot. I put him on doxycycline. He has [name] long list of medication allergies which are true allergy reactions. 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 In any case he is [name] lot better today. The erythema especially in the anterior and lateral left calf is better. He still has left ankle erythema. He also is complaining about increasing edema in the right leg we have only been using Kerlix Coban and he has been doing the wraps at home. Finally he has [name] spotty rash on the medial part of his upper left calf which looks like folliculitis or perhaps wrap occlusion type injury. Small  superficial macules not pustules 11/06/18 patient arrives today with again [name] considerable degree of erythema around the wound on the left lateral calf extending into the dorsal ankle and dorsal foot. This is [name] lot worse than when  I saw this last week. He is on doxycycline really with not [name] lot of improvement. He has not been systemically unwell Kolasinski, Nissan [name] ([id number]) [phone].pdf Page 27 of 38 Wounds on the; left heel actually looks improved. Original area on the left foot and proximity to the first and second toes looks about the same. He has superficial areas on the dorsal foot, anterior calf and then the reopening of his original wound on the left lateral calf which looks about the same ooThe only area he has on the right is the dorsal webspace first and second which is smaller. ooHe has [name] large area of dry erythematous skin on the left buttock small open area here. 11/13/2018; the patient arrives in much better condition. The erythema around the wound on the left lateral calf is [name] lot better. Not sure whether this was the clindamycin or the TCA and ketoconazole or just in the improvement in edema control [stasis dermatitis]. In any case this is [name] lot better. The area on the left heel is very small and just about resolved using silver collagen we have been using silver alginate to the areas on his dorsal feet 11/20/2018; his wounds include the left lateral calf, left heel, dorsal aspects of both feet just proximal to the first second webspace. He is stable to slightly improved. I did not think any changes to his dressings were going to be necessary 11/27/2018 he has [name] reopening on the left buttock which is surrounded by what looks like tinea or perhaps some other form of dermatitis. The area on the left dorsal foot has some erythema around it I have marked this area but I am not sure whether this is cellulitis or not. Left heel is not closed. Left calf the reopening is really slightly longer and probably worse 1/13; in general things look better and smaller except for the left dorsal foot. Area on the left heel is just about closed, left buttock looks better only [name] small wound remains in the  skin looks better [using Lotrisone] 1/20; the area on the left heel only has [name] few remaining open areas here. Left lateral calf about the same in terms of size, left dorsal foot slightly larger right lateral foot still not closed. The area on the left buttock has no open wound and the surrounding skin looks [name] lot better 1/27; the area on the left heel is closed. Left lateral calf better but still requiring extensive debridements. The area on his left buttock is closed. He still has the open areas on the left dorsal foot which is slightly smaller in the right foot which is slightly expanded. We have been using Iodoflex on these areas as well 2/3; left heel is closed. Left lateral calf still requiring debridement using Iodoflex there is no open area on his left buttock however he has dry scaly skin over [name] large area of this. Not really responding well to the Lotrisone. Finally the areas on his dorsal feet at the level of the first second webspace are slightly smaller on the right and about the same on the left. Both of these vigorously debrided with Anasept and gauze 2/10; left heel remains closed he has dry erythematous skin over the left buttock but there is no open  wound here. Left lateral leg has come in and with. Still requiring debridement we have been using Iodoflex here. Finally the area on the left dorsal foot and right dorsal foot are really about the same extremely dry callused fissured areas. He does not yet have [name] dermatology appointment 2/17; left heel remains closed. He has [name] new open area on the left buttock. The area on the left lateral calf is bigger longer and still covered in necrotic debris. No major change in his foot areas bilaterally. I am awaiting for [name] dermatologist to look on this. We have been using ketoconazole I do not know that this is been doing any good at all. 2/24; left heel remains closed. The left buttock wound that was new reopening last week looks better. The left  lateral calf appears better also although still requires debridement. The major area on his foot is the left first second also requiring debridement. We have been putting Prisma on all wounds. I do not believe that the ketoconazole has done too much good for his feet. He will use Lotrisone I am going to give him [name] 2-week course of terbinafine. We still do not have [name] dermatology appointment 3/2 left heel remains closed however there is skin over bone in this area I pointed this out to him today. The left buttock wound is epithelialized but still does not look completely stable. The area on the left leg required debridement were using silver collagen here. With regards to his feet we changed to Lotrisone last week and silver alginate. 3/9; left heel remains closed. Left buttock remains closed. The area on the right foot is essentially closed. The left foot remains unchanged. Slightly smaller on the left lateral calf. Using silver collagen to both of these areas 3/16-Left heel remains closed. Area on right foot is closed. Left lateral calf above the lateral malleolus open wound requiring debridement with easy bleeding. Left dorsal wound proximal to first toe also debrided. Left ischial area open new. Patient has been using Prisma with wrapping every 3 days. Dermatology appointment is apparently tomorrow.Patient has completed his terbinafine 2-week course with some apparent improvement according to him, there is still flaking and dry skin in his foot on the left 3/23; area on the right foot is reopened. The area on the left anterior foot is about the same still [name] very necrotic adherent surface. He still has the area on the left leg and reopening is on the left buttock. He apparently saw dermatology although I do not have [name] note. According to the patient who is usually fairly well informed they did not have any good ideas. Put him on oral terbinafine which she is been on before. 3/30; using silver  collagen to all wounds. Apparently his dermatologist put him on doxycycline and rifampin presumably some culture grew staph. I do not have this result. He remains on terbinafine although I have used terbinafine on him before 4/6; patient has had [name] fairly substantial reopening on the right foot between the first and second toes. He is finished his terbinafine and I believe is on doxycycline and rifampin still as prescribed by dermatology. We have been using silver collagen to all his wounds although the patient reports that he thinks silver alginate does better on the wounds on his buttock. 4/13; the area on his left lateral calf about the same size but it did not require debridement. ooLeft dorsal foot just proximal to the webspace between the first and second toes is about  the same. Still nonviable surface. I note some superficial bronze discoloration of the dorsal part of his foot ooRight dorsal foot just proximal to the first and second toes also looks about the same. I still think there may be the same discoloration I noted above on the left ooLeft buttock wound looks about the same 4/20; left lateral calf appears to be gradually contracting using silver collagen. 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 The left dorsal foot wound is debrided of tightly adherent necrotic debris and really cleans up quite nicely. The right area is worse with expansion. I did not debride this it is now over the base of the second toe ooThe area on his left buttock is smaller no debridement is required using silver collagen 5/4; left calf continues to make good progress. ooHe arrives with erythema around the wounds on his dorsal foot which even extends to the plantar aspect. Very concerning for coexistent infection. He is finished the erythromycin I gave him for possible erythrasma this does not seem to have helped. ooThe area on the left foot is  about the same base of the dorsal toes ooIs area on the buttock looks improved on the left 5/11; left calf and left buttock continued to make good progress. Left foot is about the same to slightly improved. ooMajor problem is on the right foot. He has not had an x-ray. Deep tissue culture I did last week showed both Enterobacter and [name]. coli. I did not change the doxycycline I put him on empirically although neither 1 of these were plated to doxycycline. He arrives today with the erythema looking worse on both the dorsal and plantar foot. Macerated skin on the bottom of the foot. he has not been systemically unwell 5/18-Patient returns at 1 week, left calf wound appears to be making some progress, left buttock wound appears slightly worse than last time, left foot wound looks slightly better, right foot redness is marginally better. X-ray of both feet show no air or evidence of osteomyelitis. Patient is finished his Omnicef and terbinafine. He continues to have macerated skin on the bottom of the left foot as well as right 5/26; left calf wound is better, left buttock wound appears to have multiple small superficial open areas with surrounding macerated skin. X-rays that I did last time showed no evidence of osteomyelitis in either foot. He is finished cefdinir and doxycycline. I do not think that he was on terbinafine. He continues to have [name] large superficial open area on the right foot anterior dorsal and slightly between the first and second toes. I did send him to dermatology 2 months ago or so wondering about whether they would do [name] fungal scraping. I do not believe they did but did do [name] culture. We have been using silver alginate to the toe areas, he has been using antifungals at home topically either ketoconazole or Lotrisone. We are using silver collagen on the left foot, silver alginate on the right, silver collagen on the left lateral leg and silver alginate on the left buttock 6/1; left  buttock area is healed. We have the left dorsal foot, left lateral leg and right dorsal foot. We are using silver alginate to the areas on both feet and silver collagen to the area on his left lateral calf 6/8; the left buttock apparently reopened late last week. He is not really sure how this happened. He is tolerating the terbinafine. Using silver alginate to all wounds  6/15; left buttock wound is larger than last week but still superficial. ooCame in the clinic today with [name] report of purulence from the left lateral leg I did not identify any infection ooBoth areas on his dorsal feet appear to be better. He is tolerating the terbinafine. Using silver alginate to all wounds 6/22; left buttock is about the same this week, left calf quite [name] bit better. His left foot is about the same however he comes in with erythema and warmth in the right forefoot once again. Culture that I gave him in the beginning of May showed Enterobacter and [name]. coli. I gave him doxycycline and things seem to improve although neither 1 of these organisms was specifically plated. Stanly, Chang [name] ([id number]) [phone].pdf Page 28 of 38 6/29; left buttock is larger and dry this week. Left lateral calf looks to me to be improved. Left dorsal foot also somewhat improved right foot completely unchanged. The erythema on the right foot is still present. He is completing the Ceftin dinner that I gave him empirically [see discussion above.) 7/6 - All wounds look to be stable and perhaps improved, the left buttock wound is slightly smaller, per patient bleeds easily, completed ceftin, the right foot redness is less, he is on terbinafine 7/13; left buttock wound about the same perhaps slightly narrower. Area on the left lateral leg continues to narrow. Left dorsal foot slightly smaller right foot about the same. We are using silver alginate on the right foot and Hydrofera Blue to the areas on the left. Unna boot on  the left 2 layer compression on the right 7/20; left buttock wound absolutely the same. Area on lateral leg continues to get better. Left dorsal foot require debridement as did the right no major change in the 7/27; left buttock wound the same size necrotic debris over the surface. The area on the lateral leg is closed once again. His left foot looks better right foot about the same although there is some involvement now of the posterior first second toe area. He is still on terbinafine which I have given him for [name] month, not certain [name] centimeter major change 06/25/19-All wounds appear to be slightly improved according to report, left buttock wound looks clean, both foot wounds have minimal to no debris the right dorsal foot has minimal slough. We are using Hydrofera Blue to the left and silver alginate to the right foot and ischial wound. 8/10-Wounds all appear to be around the same, the right forefoot distal part has some redness which was not there before, however the wound looks clean and small. Ischial wound looks about the same with no changes 8/17; his wound on the left lateral calf which was his original chronic venous insufficiency wound remains closed. Since I last saw him the areas on the left dorsal foot right dorsal foot generally appear better but require debridement. The area on his left initial tuberosity appears somewhat larger to me perhaps hyper granulated and bleeds very easily. We have been using Hydrofera Blue to the left dorsal foot and silver alginate to everything else 8/24; left lateral calf remains closed. The areas on his dorsal feet on the webspace of the first and second toes bilaterally both look better. The area on the left buttock which is the pressure ulcer stage II slightly smaller. I change the dressing to Hydrofera Blue to all areas 8/31; left lateral calf remains closed. The area on his dorsal feet bilaterally look better. Using Hydrofera Blue. Still requiring  debridement on the left foot. No change in the left buttock pressure ulcers however 9/14; left lateral calf remains closed. Dorsal feet look quite [name] bit better than 2 [name] ago. Flaking dry skin also [name] lot better with the ammonium lactate I gave him 2 [name] ago. The area on the left buttock is improved. He states that his Roho cushion developed [name] leak and he is getting [name] new one, in the interim he is offloading this vigorously 9/21; left calf remains closed. Left heel which was [name] possible DTI looks better this week. He had macerated tissue around the left dorsal foot right foot looks satisfactory and improved left buttock wound. I changed his dressings to his feet to silver alginate bilaterally. Continuing Hydrofera Blue on the left buttock. 9/28 left calf remains closed. Left heel did not develop anything [possible DTI] dry flaking skin on the left dorsal foot. Right foot looks satisfactory. Improved left buttock wound. We are using silver alginate on his feet Hydrofera Blue on the buttock. I have asked him to go back to the Lotrisone on his feet including the wounds and surrounding areas 10/5; left calf remains closed. The areas on the left and right feet about the same. [name] lot of this is epithelialized however debris over the remaining open areas. He is using Lotrisone and silver alginate. The area on the left buttock using Hydrofera Blue 10/26. Patient has been out for 3 [name] secondary to Covid concerns. He tested negative but I think his wife tested positive. He comes in today with the left foot substantially worse, right foot about the same. Even more concerning he states that the area on his left buttock closed over but then reopened and is considerably deeper in one aspect than it was before [stage III wound] 11/2; left foot really about the same as last week. Quarter sized wound on the dorsal foot just proximal to the first second toes. Surrounding erythema with areas of denuded epithelium.  This is not really much different looking. Did not look like cellulitis this time however. ooRight foot area about the same.. We have been using silver alginate alginate on his toes ooLeft buttock still substantial irritated skin around the wound which I think looks somewhat better. We have been using Hydrofera Blue here. 11/9; left foot larger than last week and [name] very necrotic surface. Right foot I think is about the same perhaps slightly smaller. Debris around the circumference also addressed. Unfortunately on the left buttock there is been [name] decline. Satellite lesions below the major wound distally and now [name] an additional one posteriorly we have been using Hydrofera Blue but I think this is [name] pressure issue 11/16; left foot ulcer dorsally again [name] very adherent necrotic surface. Right foot is about the same. Not much change in the pressure ulcer on his left buttock. 11/30; left foot ulcer dorsally basically the same as when I saw him 2 [name] ago. Very adherent fibrinous debris on the wound surface. Patient reports [name] lot of drainage as well. 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 We have been using Iodoflex, patient changed back to alginate because of the drainage. Area on his right dorsal foot really looks benign with [name] healthier surface certainly [name] lot better than on the left. Left buttock wounds all improved using Hydrofera Blue 12/7; left dorsal foot again no improvement. Tightly adherent debris. PCR culture I did last week only showed likely skin contaminant. I have gone ahead  and done [name] punch biopsy of this which is about the last thing in terms of investigations I can think to do. He has known venous insufficiency and venous hypertension and this could be the issue here. The area on the right foot is about the same left buttock slightly worse according to our intake nurse secondary to [facility] Blue sticking to the wound 12/14; biopsy of  the left foot that I did last time showed changes that could be related to wound healing/chronic stasis dermatitis phenomenon no neoplasm. We have been using silver alginate to both feet. I change the one on the left today to Sorbact and silver alginate to his other 2 wounds 12/28; the patient arrives with the following problems; ooMajor issue is the dorsal left foot which continues to be [name] larger deeper wound area. Still with [name] completely nonviable surface ooParadoxically the area mirror image on the right on the right dorsal foot appears to be getting better. ooHe had some loss of dry denuded skin from the lower part of his original wound on the left lateral calf. Some of this area looked [name] little vulnerable and for this reason we put him in wrap that on this side this week ooThe area on his left buttock is larger. He still has the erythematous circular area which I think is [name] combination of pressure, sweat. This does not look like cellulitis or fungal dermatitis 11/26/2019; -Dorsal left foot large open wound with depth. Still debris over the surface. Using Sorbact ooThe area on the dorsal right foot paradoxically has closed over [facility] has [name] reopening on the left ankle laterally at the base of his original wound that extended up into the calf. This appears clean. ooThe left buttock wound is smaller but with very adherent necrotic debris over the surface. We have been using silver alginate here as well The patient had arterial studies done in 2017. He had biphasic waveforms at the dorsalis pedis and posterior tibial bilaterally. ABI in the left was 1.17. Digit waveforms were dampened. He has slight spasticity in the great toes I do not think [name] TBI would be possible 1/11; the patient comes in today with [name] sizable reopening between the first and second toes on the right. This is not exactly in the same location where we have been treating wounds previously. According to our intake nurse this was  actually fairly deep but 0.6 cm. The area on the left dorsal foot looks about the same the surface is somewhat cleaner using Sorbact, his MRI is in 2 days. We have not managed yet to get arterial studies. The new reopening on the left lateral calf looks somewhat better using alginate. The left buttock wound is about the same using alginate 1/18; the patient had his ARTERIAL studies which were quite normal. ABI in the right at 1.13 with triphasic/biphasic waveforms on the left ABI 1.06 again with triphasic/biphasic waveforms. It would not have been possible to have done [name] toe brachial index because of spasticity. We have been using Sorbac to the left foot alginate to the rest of his wounds on the right foot left lateral calf and left buttock 1/25; arrives in clinic with erythema and swelling of the left forefoot worse over the first MTP area. This extends laterally dorsally and but also posteriorly. Still has an area on the left lateral part of the lower part of his calf wound it is eschared and clearly not closed. ooArea on the left buttock still with surrounding irritation and  erythema. ooRight foot surface wound dorsally. The area between the right and first and second toes appears better. 2/1; ooThe left foot wound is about the same. Erythema slightly better I gave him [name] week of doxycycline empirically ooRight foot wound is more extensive extending between the toes to the plantar surface ooLeft lateral calf really no open surface on the inferior part of his original wound however the entire area still looks vulnerable ooAbsolutely no improvement in the left buttock wound required debridement. 2/8; the left foot is about the same. Erythema is slightly improved I gave him clindamycin last week. Mabry, Monroe [name] ([id number]) [phone].pdf Page 29 of 38 ooRight foot looks better he is using Lotrimin and silver alginate ooHe has [name] breakdown in the left lateral calf.  Denuded epithelium which I have removed ooLeft buttock about the same were using Hydrofera Blue 2/15; left foot is about the same there is less surrounding erythema. Surface still has tightly adherent debris which I have debriding however not making any progress ooRight foot has [name] substantial wound on the medial right second toe between the first and second webspace. ooStill an open area on the left lateral calf distal area. ooButtock wound is about the same 2/22; left foot is about the same less surrounding erythema. Surface has adherent debris. Polymen Ag Right foot area significant wound between the first and second toes. We have been using silver alginate here Left lateral leg polymen Ag at the base of his original venous insufficiency wound ooLeft buttock some improvement here 3/1; ooRight foot is deteriorating in the first second toe webspace. Larger and more substantial. We have been using silver alginate. ooLeft dorsal foot about the same markedly adherent surface debris using PolyMem Ag ooLeft lateral calf surface debris using PolyMem AG ooLeft buttock is improved again using PolyMem Ag. ooHe is completing his terbinafine. The erythema in the foot seems better. He has been on this for 2 [name] 3/8; no improvement in any wound area in fact he has [name] small open area on the dorsal midfoot which is new this week. He has not gotten his foot x-rays yet 3/15; his x-rays were both negative for osteomyelitis of both feet. No major change in any of his wounds on the extremities however his buttock wounds are better. We have been using polymen on the buttocks, left lower leg. Iodoflex on the left foot and silver alginate on the right 3/22; arrives in clinic today with the 2 major issues are the improvement in the left dorsal foot wound which for once actually looks healthy with [name] nice healthy wound surface without debridement. Using Iodoflex here. Unfortunately on the left lateral calf  which is in the distal part of his original wound he came to the clinic here for there was purulent drainage noted some increased breakdown scattered around the original area and [name] small area proximally. We we are using polymen here will change to silver alginate today. His buttock wound on the left is better and I think the area on the right first second toe webspace is also improved 3/29; left dorsal foot looks better. Using Iodoflex. Left ankle culture from deterioration last time grew [name]. coli, Enterobacter and Enterococcus. I will give him [name] course of cefdinir although that will not cover Enterococcus. The area on the right foot in the webspace of the first and second toe lateral first toe looks better. The area on his buttock is about healed Vascular appointment is on April 21. This is to look  at his venous system vis--vis continued breakdown of the wounds on the left including the left lateral leg and left dorsal foot he. He has had previous ablations on this side 4/5; the area between the right first and second toes lateral aspect of the first toe looks better. Dorsal aspect of the left first toe on the left foot also improved. Unfortunately the left lateral lower leg is larger and there is [name] second satellite wound superiorly. The usual superficial abrasions on the left buttock overall better but certainly not closed 4/12; the area between the right first and second toes is improved. Dorsal aspect of the left foot also slightly smaller with [name] vibrant healthy looking surface. No real change in the left lateral leg and the left buttock wound is healed He has an unaffordable co-pay for Apligraf. Appointment with vein and vascular with regards to the left leg venous part of the circulation is on 4/21 4/19; we continue to see improvement in all wound areas. Although this is minor. He has his vascular appointment on 4/21. The area on the left buttock has not reopened although right in the center of  this area the skin looks somewhat threatened 4/26; the left buttock is unfortunately reopened. In general his left dorsal foot has [name] healthy surface and looks somewhat smaller although it was not measured as such. The area between his first and second toe webspace on the right as [name] small wound against the first toe. The patient saw vascular surgery. The real question I was asking was about the small saphenous vein on the left. He has previously ablated left greater saphenous vein. Nothing further was commented on on the left. Right greater saphenous vein without reflux at the saphenofemoral junction or proximal thigh there was no indication for ablation of the right greater saphenous vein duplex was negative for DVT bilaterally. They did not think there was anything from [name] vascular surgery point of view that could be offered. They ABIs within normal limits 5/3; only small open area on the left buttock. The area on the left lateral leg which was his original venous reflux is now 2 wounds both which look clean. We are using Iodoflex on the left dorsal foot which looks healthy and smaller. He is down to [name] very tiny area between the right first and second toes, using silver alginate 5/10; all of his wounds appear better. We have much better edema control in 4 layer compression on the left. This may be the factor that is allowing the left foot and left lateral calf to heal. He has external compression garments at home 04/14/20-All of his wounds are progressing well, the left forefoot is practically closed, left ischium appears to be about the same, right toe webspace is also smaller. The left lateral leg is about the same, continue using Hydrofera Blue to this, silver alginate to the ischium, Iodoflex to the toe space on the right 6/7; most of his wounds outside of the left buttock are doing well. The area on the left lateral calf and left dorsal foot are smaller. The area on the right foot in between the  first and second toe webspace is barely visible although he still says there is some drainage here is the only reason I did not heal this out. ooUnfortunately the area on the left buttock almost looks like he has [name] skin tear from tape. He has open wound and then [name] large flap of skin that we are trying to get adherence  over an area just next to the remaining wound 6/21; 2 week follow-up. I believe is been here for nurse visits. Miraculously the area between his first and second toes on the left dorsal foot is closed over. Still open on the right first second web space. The left lateral calf has 2 open areas. Distally this is more superficial. The proximal area had [name] little more depth and required debridement of adherent necrotic material. His buttock wound is actually larger we have been using silver alginate here 6/28; the patient's area on the left foot remains closed. Still open wet area between the first and second toes on the right and also extending into the plantar aspect. We have been using silver alginate in this location. He has 2 areas on the left lower leg part of his original long wounds which I think are better. We have been using Hydrofera Blue here. Hydrofera Blue to the left buttock which is stable 7/12; left foot remains closed. Left ankle is closed. May be [name] small area between his right first and second toes the only truly open area is on the left buttock. We have been using Hydrofera Blue here 7/19; patient arrives with marked deterioration especially in the left foot and ankle. We did not put him in [name] compression wrap on the left last week in fact he wore his juxta lite stockings on either side although he does not have an underlying stocking. He has [name] reopening on the left dorsal foot, left lateral ankle and [name] new area on the right dorsal ankle. More worrisome is the degree of erythema on the left foot extending on the lateral foot into the lateral lower leg on the left 7/26; the  patient had erythema and drainage from the lateral left ankle last week. Culture of this grew MRSA resistant to doxycycline and clindamycin which are the 2 antibiotics we usually use with this patient who has multiple antibiotic allergies including linezolid, trimethoprim sulfamethoxazole. I had give him an empiric doxycycline and he comes in the area certainly looks somewhat better although it is blotchy in his lower leg. He has not been systemically unwell. He has had areas on the left dorsal foot which is [name] reopening, chronic wounds on the left lateral ankle. Both of these I think are secondary to chronic venous insufficiency. The area between his first and second toes is closed as far as I can tell. He had [name] new wrap injury on the right dorsal ankle last week. Finally he has an area on the left buttock. We have been using silver alginate to everything except the left buttock we are using Hydrofera Blue 06/30/20-Patient returns at 1 week, has been given [name] sample dose pack of NUZYRA which is [name] tetracycline derivative [omadacycline], patient has completed those, we have been using silver alginate to almost all the wounds except the left ischium where we are using Hydrofera Blue all of them look better 8/16; since I last saw the patient he has been doing well. The area on the left buttock, left lateral ankle and left foot are all closed today. He has completed the [place] I gave him last time and tolerated this well. He still has open areas on the right dorsal ankle and in the right first second toe area which we are using silver alginate. 8/23; we put him in his bilateral external compression stockings last week as he did not have anything open on either leg except for concerning area between the right first  and second toe. He comes in today with an area on the left dorsal foot slightly more proximal than the original wound, the left lateral foot but this is actually [name] continuation of the area he had on  the left lateral ankle from last time. As well he is opened up on the left buttock again. 8/30; comes in today with things looking [name] lot better. The area on the left lower ankle has closed down as has the left foot but with eschar in both areas. The area on the dorsal right ankle is also epithelialized. Very little remaining of the left buttock wound. We have been using silver alginate on all wound areas 9/13; the area in the first second toe webspace on the right has fully epithelialized. He still has some vulnerable epithelium on the right and the ankle and the dorsal foot. He notes weeping. He is using his juxta lite stocking. On the left again the left dorsal foot is closed left lateral ankle is closed. We went to the juxta lite stocking here as well. ooStill vulnerable in the left buttock although only 2 small open areas remain here Yau, Tameem [name] ([id number]) [phone].pdf Page 30 of 38 9/27; 2-week follow-up. We did not look at his left leg but the patient says everything is closed. He is [name] bit disturbed by the amount of edema in his left foot he is using juxta lite stockings but asking about over the toes stockings which would be 30/40, will talk to him next time. According to him there is no open wound on either the left foot or the left ankle/calf He has an open area on the dorsal right calf which I initially point [name] wrap injury. He has superficial remaining wound on the left ischial tuberosity been using silver alginate although he says this sticks to the wound 10/5; we gave him 2-week follow-up but he called yesterday expressing some concerns about his right foot right ankle and the left buttock. He came in early. There is still no open areas on the left leg and that still in his juxta lite stocking 10/11; he only has 1 small area on the left buttock that remains measuring millimeters 1 mm. Still has the same irritated skin in this area. We recommended zinc oxide  when this eventually closes and pressure relief is meticulously is he can do this. He still has an area on the dorsal part of his right first through third toes which is [name] bit irritated and still open and on the dorsal ankle near the crease of the ankle. We have been using silver alginate and using his own stocking. He has nothing open on the left leg or foot 10/25; 2-week follow-up. Not nearly as good on the left buttock as I was hoping. For open areas with 5 looking threatened small. He has the erythematous irritated chronic skin in this area. oo1 area on the right dorsal ankle. He reports this area bleeds easily ooRight dorsal foot just proximal to the base of his toes ooWe have been using silver alginate. 11/8; 2-week follow-up. Left buttock is about the same although I do not think the wounds are in the same location we have been using silver alginate. I have asked him to use zinc oxide on the skin around the wounds. ooHe still has [name] small area on the right dorsal ankle he reports this bleeds easily ooRight dorsal foot just proximal to the base of the toes does not have anything open although the  skin is very dry and scaly ooHe has [name] new opening on the nailbed of the left great toe. Nothing on the left ankle 11/29; 3-week follow-up. Left buttock has 2 open areas. And washing of these wounds today started bleeding easily. Suggesting very friable tissue. We have been using silver alginate. Right dorsal ankle which I thought was initially [name] wrap injury we have been using silver alginate. Nothing open between the toes that I can see. He states the area on the left dorsal toe nailbed healed after the last visit in 2 or 3 days 12/13; 3-week follow-up. His left buttock now has 3 open areas but the original 2 areas are smaller using polymen here. Surrounding skin looks better. The right dorsal ankle is closed. He has [name] small opening on the right dorsal foot at the level of the third toe. In  general the skin looks better here. He is wearing his juxta lite stocking on the left leg says there is nothing open 11/24/2020; 3 [name] follow-up. His left buttock still has the 3 open areas. We have been using polymen but due to lack of response he changed to [facility] area. Surrounding skin is dry erythematous and irritated looking. There is no evidence of infection either bacterial or fungal however there is loss of surface epithelium ooHe still has very dry skin in his foot causing irritation and erythema on the dorsal part of his toes. This is not responded to prolonged courses of antifungal simply looks dry and irritated 1/24; left buttock area still looks about the same he was unable to find the triad ointment that we had suggested. The area on the right lower leg just above the dorsal ankle has reopened and the areas on the right foot between the first second and second third toes and scaling on the bottom of the foot has been about the same for quite some time now. been using silver alginate to all wound areas 2/7; left buttock wound looked quite good although not much smaller in terms of surface area surrounding skin looks better. Only [name] few dry flaking areas on the right foot in between the first and second toes the skin generally looks better here [ammonium lactate]. Finally the area on the right dorsal ankle is closed 2/21; ooThere is no open area on the right foot even between the right first and second toe. Skin around this area dorsally and plantar aspects look better. ooHe has [name] reopening of the area on the right ankle just above the crease of the ankle dorsally. I continue to think that this is probably friction from spasms may be even this time with his stocking under the compression stockings. ooWounds on his left buttock look about the same there [name] couple of areas that have reopened. He has [name] total square area of loss of epithelialization. This does not look like  infection it looks like [name] contact dermatitis but I just cannot determine to what 3/14; there is nothing on the right foot between the first and second toes this was carefully inspected under illumination. Some chronic irritation on the dorsal part of his foot from toes 1-3 at the base. Nothing really open here substantially. Still has an area on the right foot/ankle that is actually larger and hyper granulated. His buttock area on the left is just about closed however he has chronic inflammation with loss of the surface epithelial layer 3/28; 2-week follow-up. In clinic today with [name] new wound on the left anterior mid  tibia. Says this happened about 2 [name] ago. He is not really sure how wonders about the spasticity of his legs at night whether that could have caused this other than that he does not have [name] good idea. He has been using topical antibiotics and silver alginate. The area on his right dorsal ankle seems somewhat better. ooFinally everything on his left buttock is closed. 4/11; 2-week follow-up. All of his wounds are better except for the area over the ischium and left buttock which have opened up widely again. At least part of this is covered in necrotic fibrinous material another part had rolled nonviable skin. The area on the right ankle, left anterior mid tibia are both [name] lot better. He had no open wounds on either foot including the areas between the first and second toes 4/25; patient presents for 2-week follow-up. He states that the wounds are overall stable. He has no complaints today and states he is using Hydrofera Blue to open wounds. 5/9; have not seen this man in over [name] month. For my memory he has open areas on the left mid tibia and right ankle. T oday he has new open area on the right dorsal foot which we have not had [name] problem with recently. He has the sustained area on the left buttock He is also changed his insurance at the beginning of the year [employer]. We will  need prior authorizations for debridement 5/23; patient presents for 2-week follow-up. He has prior authorizations for debridement. He denies any issues in the past 2 [name] with his wound care. He has been using Hydrofera Blue to all the wounds. He does report [name] circular rash to the upper left leg that is new. He denies acute signs of infection. 6/6; 2-week follow-up. The patient has open wounds on the left buttock which are worse than the last time I saw this about [name] month ago. He also has [name] new area to me on the left anterior mid tibia with some surrounding erythema. The area on the dorsal ankle on the right is closed but I think this will be [name] friction injury every time this area is exposed to either our wraps or his compression stockings caused by unrelenting spasms in this leg. 6/20; 2-week follow-up. oo The patient has open wounds on the left buttock which is about the same. Using [employer] [facility] here. - The left mid tibia has [name] static amount of surrounding erythema. Also [name] raised area in the center. We have been using Hydrofera Blue here. ooooFinally he has broken down in his dorsal right foot extending between the first and second toes and going to the base of the first and second toe webspace. I have previously assumed that this was severe venous hypertension 7/5; 2-week follow-up oo The left buttock wound actually looks better. We are using Hydrofera Blue. He has extensive skin irritation around this area and I have not really been able to get that any better. I have tried Lotrisone i.[name]. antifungals and steroids. More most recently we have just been using Coloplast really looks about the same. oo The left mid tibia which was new last week culture to have very resistant staph aureus. Not only methicillin-resistant but doxycycline resistant. The patient has [name] plethora of antibiotic allergies including sulfa, linezolid. I used topical bacitracin on this but he has not started this yet. oo  In addition he has an expanding area of erythema with [name] wound on the dorsal right foot. I did [name]  deep tissue culture of this area today 7/12; oo Left buttock area actually looks better surrounding skin also looks less irritated. oo Left mid tibia looks about the same. He is using bacitracin this is not worse oo Right dorsal foot looks about the same as well. oo The left first toe also looks about the same 7/19; left buttock wound continues to improve in terms of open areas oo Left mid tibia is still concerning amount of swelling he is using bacitracin oo Dorsal left first toe somewhat smaller oo Right dorsal foot somewhat smaller 7/25; left buttock wound actually continues to improve oo Left mid tibia area has less swelling. I gave him all my samples of new Nuzyra. This seems to have helped although the wound is still open it. His abrasion closed by here Lok, Deavion [name] ([id number]) [phone].pdf Page 31 of 38 oo Left dorsal great toe really no better. Still [name] very nonviable surface oo Right dorsal foot perhaps some better. oo We have been using bacitracin and silver nitrate to the areas on his lower legs and Hydrofera Blue to the area on the buttock. 8/16 oo Disappointed that his left buttock wound is actually more substantial. Apparently during the last nurse visit these were both very small. He has continued irritation to [name] large area of skin on his buttock. I have never been able to totally explain this although I think it some combination of the way he sits, pressure, moisture. He is not incontinent enough to contribute to this. oo Left dorsal great toe still fibrinous debris on the surface that I have debrided today oo Large area across the dorsal right toes. oo The area on the left anterior mid tibia has less swelling. He completed the [place]. This does not look infected although the tissue is still fried 8/30; 2-week follow-up. oo Left buttock  areas not improved. We used Hydrofera Blue on this. Weeping wet with the surrounding erythema that I have not been able to control even with Lotrisone and topical Coloplast oo Left dorsal great toe looks about the same oo More substantial area again at the base of his toes on the left which is new this week. oo Area across the dorsal right toes looks improved oo The left anterior mid tibia looks like it is trying to close 9/13; 2-week follow-up. Using silver alginate on all of his wounds. The left dorsal foot does not look any better. He has the area on the dorsal toe and also the areas at the base of all of his toes 1 through 3. On the right foot he has [name] similar pattern in [name] similar area. He has the area on his left mid tibia that looks fairly healthy. Finally the area on his left buttock looks somewhat bette 9/20; culture I did of the left foot which was [name] deep tissue culture last time showed [name]. coli he has erythema around this wound. Still [name] completely necrotic surface. His right dorsal foot looks about the same. He has [name] very friable surface to the left anterior mid tibia. Both buttock wounds look better. We have been using silver alginate to all wounds 10/4; he has completed the cephalexin that I gave him last time for the left foot. He is using topical gentamicin under silver alginate silver alginate being applied to all the wounds. Unfortunately all the wounds look irritated on his dorsal right foot dorsal left foot left mid tibia. I wonder if this could be [name] silver allergy. I am going  to change him to [facility] on the lower extremity. The skin on the left buttock and left posterior thigh still flaking dry and irritated. This has continued no matter what I have applied topically to this. He has [name] solitary open wound which by itself does not look too bad however the entire area of surrounding skin does not change no matter what we have applied here 10/18; the area on the left dorsal  foot and right dorsal foot both look better. The area on the right extends into the plantar but not between his toes. We have been using silver alginate. He still has [name] rectangular erythematous area around the area on the left tibia. The wound itself is very small. Finally everything on his left buttock looks [name] little larger the skin is erythematous 11/15; patient comes in with the left dorsal and right dorsal foot distally looking somewhat better. Still nonviable surface on the left foot which required debridement. He still has the area on the left anterior mid tibia although this looks somewhat better. He has [name] new area on the right lateral lower leg just above the ankle. Finally his left buttock looks terrible with multiple superficial open wounds geometric square shaped area of chronic erythema which I have not been able to sort through 11/29; right dorsal foot and left dorsal foot both look somewhat better. No debridement required. He has the fragile area on the left anterior mid tibia this looks and continues to look somewhat better. Right lateral lower leg just above the ankle we identified last time also looks better. In general the area on his left buttock looks improved. We are using Hydrofera Blue to all wound areas 12/13; right dorsal foot looks better. The area on the right lateral leg is healed. Left dorsal foot has 2 open areas both of which require debridement. The fragile area on the left anterior mid tibial looks better. Smaller area on his buttocks. Were using Hydrofera Blue 1/10; patient comes in with everything looking slightly larger and/or worse. This includes his left buttock, reopening of the left mid tibia, larger areas on the left dorsal foot and what looks to be [name] cellulitis on the right dorsal and plantar foot. We have been using Hydrofera Blue on all wounds. 1/17; right dorsal foot distally looks better today. The left foot has 2 open wounds that are about the same  surrounding erythema. 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 No malignancy they did not stain for fungus 1/24; his right distal foot is not closed dry and scaly but the wound looks like it is contracting. I did not debride anything here. Problem on his left dorsal foot with expanding erythema. Apparently there were problems last week getting the Elesa Hacker however it is now available at the [facility] but [name] week later. He is using ketoconazole and Coloplast to the left buttock along with Hydrofera Blue this actually looks quite [name] bit better today. 1/31; right dorsal foot again is dry and scaly but looking to contract. He has been using [name] moisturizer on his feet at my request but he is not sure which 1. The left dorsal foot wounds look about the same there is erythema here that I marked last week however after course of Nuzyra it certainly is not any better but not any worse either. Finally on his left buttock the skin continues to look  better he has the original wound but [name] new substantial area towards the gluteal cleft. Almost like [name] skin tear. I used scissors to remove skin and subcutaneous tissue here silver nitrate and direct pressure 2/7; right dorsal foot. This does not look too much different from last week. Some erythema skin dry and scaly. No debridement. Left dorsal great toe again still not much improvement. I did remove flaking dry skin and callus from around the edge. Finally on his left buttock. The skin is somewhat better in the periwound. Surface wounds are superficial somewhat better than last week. 01/26/2022: Is [name] little bit of [name] mystery as to why his wounds fail to respond to treatments and actually seem to get worse. This is my first encounter with this patient. He was previously followed by Dr. Dellia Nims. Based upon my review of the chart,  it seems that there is [name] little bit of [name] mystery as to why his wounds do not respond as anticipated to the interventions applied and sometimes even get worse. Biopsies have been performed and he was seen by dermatology in [place], but that did not shed any light on the matter. T oday, his gluteal wound is larger, with substantial drainage, rather malodorous. The food wounds are not terrible, but he has [name] lot of callus and scaly skin around these. He is currently getting silver alginate on the gluteal wound, with idodoflex to the feet. He is using lotrisone on his legs for the dry, scaly skin. 02/09/2022: There has really been no change to any of his wounds. The gluteal wound less drainage and odor, but remains about the same size, the periwound skin remains oddly scaly. His lower extremity wounds also appear roughly the same size. They continue to accumulate [name] small amount of slough. The periwound on his feet and ankle wounds has dry eschar and loose dead skin. We have been using silver alginate on the gluteal wound and Iodoflex on his feet and ankle wounds. T the periwound around his gluteal lesion and Lotrisone on his feet and legs. o 02/23/2022: The right plantar foot wound is closed. The gluteal site looks small but has continued to produce hypertrophic granulation tissue. The foot wounds all look about the same on the dorsal surface of the right foot; on the left, there is only [name] small open area at the site of where his left great toenail would have been. 03/16/2022: The right ankle wound is healed. The right dorsal foot wound is about the same. The left dorsal foot wound is quite [name] bit smaller and the ischial wound is nearly closed. 03/30/2022: The right ankle wound reopened. Both dorsal foot wounds are quite [name] bit smaller. Unfortunately, he appears to have sheared part of his ischial wound open further, perhaps during [name] transfer. 04/13/2022: The right ankle wound has hypertrophic granulation tissue  present. The dorsal foot wounds continue to decrease in size. The ischial wound looks about the same today, no better, no worse. Obie, Seymore [name] ([id number]) [phone].pdf Page 32 of 38 04/27/2022: The right ankle wound has closed. Unfortunately, it looks like some moisture got underneath the dry skin on both of his dorsal feet and these wounds have expanded in size. The ischial wound remains the same with perhaps [name] little bit more slough accumulation than at our previous visit. 05/11/2022: The right ankle wound remains closed. There is [name] left anterior tibial wound that is small has patchy openings with accumulated slough. The dorsum of his right foot appears to  be nearly healed with just [name] small punctate opening. The plantar surface of his right foot has [name] new opening that looks like he may have picked some skin there. His sacral ulcer has hypertrophic granulation tissue but has some slough accumulation. The dorsum of his left foot has multiple open areas in [name] fairly ragged distribution. All of these have slough accumulated within them. 06/01/2022: The right ankle and left anterior tibial wound are both closed. Dorsum of his right foot and left foot both look substantially better with just tiny scattered openings The without any slough accumulation. He has sheared open new areas on his left gluteus and ischium. He says that his wheelchair cushion, which is air-filled, has [name] leak and so it keeps deflating. He is awaiting [name] new cushion. 06/15/2022: The right ankle wound has reopened and the fat layer is exposed. Both dorsal feet have just small openings with just [name] little bit of slough and eschar accumulation. The wound on his left gluteus and ischium is larger again today and has [name] foul odor. 06/29/2022: The right ankle wound has hypertrophic granulation tissue buildup. His dorsal foot wounds both look better with just some eschar on the surface. He has [name] new wound on his left lateral  ankle. He is not sure how he acquired it but by appearance, it looks that he hit it on something, potentially his wheelchair or bed. The ischial wound is about the same but is cleaner without any significant purulent drainage or odor. He did not understand what the [facility] call was about and therefore he does not have the topical compounded antibiotic. 07/13/2022: The right ankle wound again has hypertrophic granulation tissue, but less so than at his previous visit. The ischial wound has improved tremendously the use of the [facility] topical antibiotic. No significant change to the left lateral ankle wound; it is fibrotic with slough present. The skin of both of his feet, especially on the right, has [name] yeasty appearance. 08/10/2022: There is again hypertrophic granulation tissue on the right anterior ankle wound. Both feet are about the same. The left lateral ankle wound is [name] little bit desiccated and has some slough buildup. He unfortunately suffered [name] new injury when he was removing his pants and they caught his bandage which caused [name] large skin tear on his left ischium, just distal to the existing wound. The existing left ischial wound, however, is significantly better with just [name] little light slough on the surface. 08/31/2022: The right anterior ankle wound is [name] lot smaller today underneath some eschar. No accumulation of hypertrophic granulation tissue. The left lateral ankle wound has some slough on the surface but is better in terms of moisture balance this week. The left dorsal foot does not really have any openings on it today. The right dorsal foot has some slough and eschar accumulation. His gluteal ulcer is basically closed aside from 2 small areas that are oozing [name] bit. 09/21/2022: The right anterior ankle wound is down to just [name] tiny pinhole. The left lateral ankle wound has accumulated slough again, but moisture balance is good. Left and right dorsal feet both have 2 small openings with  some slough in them. The gluteal ulcer, unfortunately has opened up substantially. 10/05/2022: The right anterior ankle wound has reopened and has [name] fair amount of slough on the surface. The left lateral ankle wound has accumulated more slough, as well. He was approved for Apligraf, but the wound is not clean enough yet. There is some slough  buildup on the dorsal right foot wound, as well as on his ischium. 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 10/26/2022: The right anterior ankle wound has closed again. The left lateral ankle wound looks quite [name] bit better. It is ready for Apligraf but we do not have 1 here for him. His ischium continues to get worse, with the wound larger and deeper. It really seems like this is [name] combination of pressure and friction. He has 2 new wounds, 1 on the plantar aspect of each foot. He says that he had some dry skin there that peeled away. Both are superficial. The dorsum of his left foot does not have any new wound opening. The dorsum of his right foot has [name] bit of slough accumulation. 11/24/2022: The right anterior ankle wound has 2 small open areas, both covered with eschar. The left lateral ankle wound has closed and considerably with just [name] little slough on the surface. The ischium has improved most significantly, having closed in most of the open area with just [name] few small remaining superficial openings. The dorsal foot wounds are small and superficial. The bottoms of his feet are closed. 12/07/2022: The right anterior ankle wound remains open with 2 small areas, again with slough and eschar present. The left lateral ankle wound is down to about half [name] centimeter with just some slough on the surface. His right dorsal foot has opened up more and has both slough and eschar present. The ischium has continued to improve and now just has 2 small open areas that are very clean. 12/21/2022: He has [name] new wound on his  right lateral ankle. There is some slough present. The left lateral ankle is quite [name] bit smaller with some slough buildup. His left ischial ulcer is also nearly closed; there is just [name] tear in his skin that seems likely to be secondary to [name] transfer mishap. He has [name] new ulcer in his natal cleft that looks like it may be secondary to moisture. There is some slough in this area as well. The right anterior ankle is nearly closed. The right dorsal foot wound has some slough accumulation 01/04/2023: The anterior right ankle wound is closed, as is his ischial ulcer. The left lateral ankle wound is nearly closed with just [name] little bit of slough accumulation. The ulcer in his natal cleft is about the same size with slough buildup there, as well. He has [name] new wound on his left lateral leg near the knee from rubbing on his wheelchair. There is slough present, but no signs of infection. The right dorsal foot wound has [name] fairly heavy layer of slough buildup, as well. 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 He has been using his old wheelchair cushion for reasons that are unclear. The ulcer in his natal cleft has healed. The wound on his left lateral leg near the knee is smaller with some slough present. The right dorsal foot wound has accumulated slough again. The right lateral ankle wound is healed. The left lateral ankle wound is smaller again this week. 02/01/2023: His ischial ulcer has had [name] ton of drainage over the last 2 [name] and the drainage has an absolutely putrid odor. He has gone back to using his new wheelchair chair cushion, though. The wound on his left lateral leg near his knee has healed. The left lateral ankle wound is nearly healed. Both the dorsal foot  wounds are larger and have [name] lot of slough accumulation and drainage. The wound on his right anterior ankle is healed but there is [name] new 1 just proximal to this, also with slough and eschar present. 02/15/2023:The left lateral and  right lateral ankle wounds are both smaller. The right anterior ankle wound has some eschar on the surface. Both dorsal feet have [name] lot of slough accumulation. 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 His new [facility] prescription should be delivered tomorrow. We are using silver alginate on everything at this point. Patient History Information obtained from Patient. Family History Diabetes - Father, Hypertension - Mother, No family history of Cancer, Heart Disease, Hereditary Spherocytosis, Kidney Disease, Lung Disease, Stroke, Thyroid Problems, Tuberculosis. Social History Former smoker, Marital Status - Married, Alcohol Use - Moderate, Drug Use - No History, Caffeine Use - Daily. Medical History Ear/Nose/Mouth/Throat Denies history of Chronic sinus problems/congestion, Middle ear problems Hematologic/Lymphatic Denies history of Anemia, Hemophilia, Human Immunodeficiency Virus, Lymphedema, Sickle Cell Disease Respiratory Pichette, Blandon [name] ([id number].pdf Page 74 of 38 Patient has history of Sleep Apnea - does not tolerate cpap Denies history of Aspiration, Asthma, Chronic Obstructive Pulmonary Disease (COPD), Pneumothorax, Tuberculosis Cardiovascular Patient has history of Hypertension - lisinopril HCTZ Denies history of Angina, Arrhythmia, Congestive Heart Failure, Coronary Artery Disease, Deep Vein Thrombosis, Hypotension, Myocardial Infarction, Peripheral Arterial Disease, Peripheral Venous Disease, Phlebitis, Vasculitis Gastrointestinal Denies history of Cirrhosis , Colitis, Crohnoos, Hepatitis [name], Hepatitis B, Hepatitis C Endocrine Denies history of Type I Diabetes, Type II Diabetes Genitourinary Denies history of End Stage Renal Disease Immunological Denies history of Lupus Erythematosus, Raynaudoos, Scleroderma Integumentary (Skin) Denies history  of History of Burn Musculoskeletal Denies history of Gout, Rheumatoid Arthritis, Osteoarthritis, Osteomyelitis Neurologic Patient has history of Paraplegia Denies history of Dementia, Neuropathy, Quadriplegia, Seizure Disorder Oncologic Denies history of Received Chemotherapy, Received Radiation Psychiatric Denies history of Anorexia/bulimia, Confinement Anxiety Hospitalization/Surgery History - cellulitis in leg. - left leg vein ablation. Objective Constitutional Tachycardic, asymptomatic. no acute distress. Vitals Time Taken: 8:17 AM, Height: 70 in, Weight: 216 lbs, BMI: 31, Temperature: 98.1 F, Pulse: 123 bpm, Respiratory Rate: 18 breaths/min, Blood Pressure: 134/81 mmHg. Respiratory Normal work of breathing on room air. General Notes: 02/15/2023:The left lateral and right lateral ankle wounds are both smaller. The right anterior ankle wound has some eschar on the surface. Both dorsal feet have [name] lot of slough accumulation. 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 Integumentary (Hair, Skin) Wound #52 status is Open. Original cause of wound was Gradually Appeared. The date acquired was: 03/27/2021. The wound has been in treatment 98 [name]. The wound is located on the Right,Dorsal Foot. The wound measures 1.2cm length x 4.3cm width x 0.1cm depth; 4.053cm^2 area and 0.405cm^3 volume. There is Fat Layer (Subcutaneous Tissue) exposed. There is no tunneling or undermining noted. There is [name] medium amount of serosanguineous drainage noted. The wound margin is flat and intact. There is small (1-33%) red granulation within the wound bed. There is [name] large (67-100%) amount of necrotic tissue within the wound bed including Adherent Slough. The periwound skin appearance had no abnormalities noted for texture. The periwound skin appearance had no abnormalities noted for color. The periwound skin  appearance exhibited: Dry/Scaly. Periwound temperature was noted as No Abnormality. Wound #56 status is Open. Original cause of wound was  Gradually Appeared. The date acquired was: 07/11/2021. The wound has been in treatment 82 [name]. The wound is located on the Left,Dorsal Foot. The wound measures 2.8cm length x 3.2cm width x 0.1cm depth; 7.037cm^2 area and 0.704cm^3 volume. There is Fat Layer (Subcutaneous Tissue) exposed. There is no tunneling or undermining noted. There is [name] medium amount of serosanguineous drainage noted. The wound margin is flat and intact. There is small (1-33%) red granulation within the wound bed. There is [name] small (1-33%) amount of necrotic tissue within the wound bed including Adherent Slough. The periwound skin appearance had no abnormalities noted for texture. The periwound skin appearance exhibited: Dry/Scaly. The periwound skin appearance did not exhibit: Maceration, Erythema. Periwound temperature was noted as No Abnormality. Wound #67 status is Open. Original cause of wound was Gradually Appeared. The date acquired was: 06/22/2022. The wound has been in treatment 33 [name]. The wound is located on the Left,Lateral Lower Leg. The wound measures 0.1cm length x 0.1cm width x 0.1cm depth; 0.008cm^2 area and 0.001cm^3 volume. There is Fat Layer (Subcutaneous Tissue) exposed. There is no tunneling or undermining noted. There is [name] small amount of serosanguineous drainage noted. The wound margin is distinct with the outline attached to the wound base. There is no granulation within the wound bed. There is no necrotic tissue within the wound bed. The periwound skin appearance had no abnormalities noted for texture. The periwound skin appearance exhibited: Dry/Scaly. The periwound skin appearance did not exhibit: Erythema. Periwound temperature was noted as No Abnormality. Wound #74 status is Open. Original cause of wound was Pressure Injury. The date acquired was: 01/10/2023. The  wound has been in treatment 4 [name]. The wound is located on the Left,Posterior Upper Leg. The wound measures 6.8cm length x 6cm width x 0.1cm depth; 32.044cm^2 area and 3.204cm^3 volume. There is Fat Layer (Subcutaneous Tissue) exposed. There is no tunneling or undermining noted. There is [name] large amount of serosanguineous drainage noted. Foul odor after cleansing was noted. The wound margin is flat and intact. There is large (67-100%) red, friable granulation within the wound bed. There is [name] small (1-33%) amount of necrotic tissue within the wound bed including Adherent Slough. The periwound skin appearance had no abnormalities noted for moisture. The periwound skin appearance had no abnormalities noted for color. The periwound skin appearance exhibited: Scarring. Periwound temperature was noted as No Abnormality. Wound #75 status is Open. Original cause of wound was Gradually Appeared. The date acquired was: 02/01/2023. The wound has been in treatment 2 [name]. The wound is located on the Right,Distal,Anterior Lower Leg. The wound measures 0.7cm length x 0.4cm width x 0.1cm depth; 0.22cm^2 area and 0.022cm^3 volume. There is Fat Layer (Subcutaneous Tissue) exposed. There is no tunneling or undermining noted. There is [name] medium amount of serosanguineous drainage noted. The wound margin is distinct with the outline attached to the wound base. There is medium (34-66%) red granulation within the wound bed. There is [name] medium (34-66%) amount of necrotic tissue within the wound bed including Adherent Slough. The periwound skin appearance had no abnormalities noted for texture. The periwound skin appearance exhibited: Dry/Scaly, Erythema. The surrounding wound skin color is noted with erythema. Erythema is measured at 7 cm. Periwound temperature was noted as No Abnormality. Grandpre, Zaydan [name] ([id number]) [phone].pdf Page 34 of 38 Wound #76 status is Open. Original cause of wound was  Pressure Injury. The date acquired was: 02/08/2023. The wound is located on the Right,Lateral Lower Leg. The wound measures 0.7cm length  x 0.9cm width x 0.1cm depth; 0.495cm^2 area and 0.049cm^3 volume. There is Fat Layer (Subcutaneous Tissue) exposed. There is no tunneling or undermining noted. There is [name] medium amount of serosanguineous drainage noted. The wound margin is flat and intact. There is large (67-100%) red granulation within the wound bed. There is [name] small (1-33%) amount of necrotic tissue within the wound bed including Adherent Slough. The periwound skin appearance had no abnormalities noted for texture. The periwound skin appearance had no abnormalities noted for color. The periwound skin appearance exhibited: Dry/Scaly. 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 There was [name] Selective/Open Wound Non-Viable Tissue Debridement with [name] total area of 5.16 sq cm performed by [name] Maudlin, MD. With the following instrument(s): Curette to remove Non-Viable tissue/material. Material removed includes [facility]. No specimens were taken. [name] time out was conducted at 08:55, prior to the start of the procedure. [name] Minimum amount of bleeding was  controlled with Pressure. The procedure was tolerated well with [name] pain level of Insensate throughout and [name] pain level of Insensate following the procedure. Post Debridement Measurements: 1.2cm length x 4.3cm width x 0.1cm depth; 0.405cm^3 volume. Character of Wound/Ulcer Post Debridement is improved. Severity of Tissue Post Debridement is: Fat layer exposed. Post procedure Diagnosis Wound #52: Same as Pre-Procedure General Notes: Scribed for Dr. Celine [name] by [name] Gouty, RN. 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 There was [name] Selective/Open Wound Non-Viable Tissue Debridement with [name] total area of 8.96 sq cm performed by [name] Maudlin, MD. With the following instrument(s): Curette to remove Non-Viable tissue/material. Material removed includes [facility]. No specimens were taken. [name] time out was conducted at 08:55, prior to the start of the procedure. [name] Minimum amount of bleeding was controlled with Pressure. The procedure was tolerated well with [name] pain level of Insensate throughout and [name] pain level of Insensate following the procedure. Post Debridement Measurements: 2.8cm length x 3.2cm width x 0.1cm depth; 0.704cm^3 volume. Character of Wound/Ulcer Post Debridement is improved. Post procedure Diagnosis Wound #56: Same as Pre-Procedure General Notes: Scribed for Dr. Celine [name] by [name] Gouty, RN. 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 There was [name] Selective/Open Wound Non-Viable Tissue Debridement with [name] total area of 0.25 sq cm performed by [name] Maudlin, MD. With the following instrument(s): Curette to remove Non-Viable tissue/material. Material removed includes [facility]. No specimens were taken. [name] time out was conducted at 08:55, prior to the start of the procedure. [name] Minimum amount of bleeding  was controlled with Pressure. The procedure was tolerated well with [name] pain level of Insensate throughout and [name] pain level of Insensate following the procedure. Post Debridement Measurements: 0.5cm length  x 0.5cm width x 0.1cm depth; 0.02cm^3 volume. Character of Wound/Ulcer Post Debridement is improved. Severity of Tissue Post Debridement is: Fat layer exposed. Post procedure Diagnosis Wound #67: Same as Pre-Procedure General Notes: Scribed for Dr. Celine [name] by [name] Gouty, RN. Wound #75 Pre-procedure diagnosis of Wound #75 is [name] Venous Leg Ulcer located on the Right,Distal,Anterior Lower Leg .Severity of Tissue Pre Debridement is: Fat layer exposed. There was [name] Selective/Open Wound Non-Viable Tissue Debridement with [name] total area of 0.28 sq cm performed by [name] Maudlin, MD. With the following instrument(s): Curette to remove Non-Viable tissue/material. Material removed includes [facility]. No specimens were taken. [name] time out was conducted at 08:55, prior to the start of the procedure. [name] Minimum amount of bleeding was controlled with Pressure. The procedure was tolerated well with [name] pain level of Insensate throughout and [name] pain level of Insensate following the procedure. Post Debridement Measurements: 0.7cm length x 0.4cm width x 0.1cm depth; 0.022cm^3 volume. Character of Wound/Ulcer Post Debridement is improved. Severity of Tissue Post Debridement is: Fat layer exposed. Post procedure Diagnosis Wound #75: Same as Pre-Procedure General Notes: Scribed for Dr. Celine [name] by [name] Gouty, RN. Wound #76 Pre-procedure diagnosis of Wound #76 is [name] Venous Leg Ulcer located on the Right,Lateral Lower Leg .Severity of Tissue Pre Debridement is: Fat layer exposed. There was [name] Selective/Open Wound Non-Viable Tissue Debridement with [name] total area of 0.63 sq cm performed by [name] Maudlin, MD. With the following instrument(s): Curette to remove Non-Viable tissue/material. Material removed includes [facility]. No  specimens were taken. [name] time out was conducted at 08:55, prior to the start of the procedure. [name] Minimum amount of bleeding was controlled with Pressure. The procedure was tolerated well with [name] pain level of Insensate throughout and [name] pain level of Insensate following the procedure. Post Debridement Measurements: 0.7cm length x 0.9cm width x 0.1cm depth; 0.049cm^3 volume. Character of Wound/Ulcer Post Debridement is improved. Severity of Tissue Post Debridement is: Fat layer exposed. Post procedure Diagnosis Wound #76: Same as Pre-Procedure General Notes: Scribed for Dr. Celine [name] by [name] Gouty, RN. Briel, Tymon [name] ([id number]) [phone].pdf Page 35 of 38 Plan Follow-up Appointments: Return Appointment in 2 [name]. - Dr. Celine [name] RM 1 Tuesday 4/9 @ 0815 am Bathing/ Shower/ Hygiene: May shower and wash wound with soap and water. - on days that dressing is changed Edema Control - Lymphedema / SCD / Other: Moisturize legs daily. - using Aquaphor generously to both legs and feet with dressing changes Compression stocking or Garment 30-40 mm/Hg pressure to: - Juxtalite to both legs daily Off-Loading: Roho cushion for wheelchair - use newer cushion Turn and reposition every 2 hours - lift up with arms in wheelchair every hour during the day WOUND #52: - Foot Wound Laterality: Dorsal, Right Cleanser: Soap and Water Every Other Day/30 Days Discharge Instructions: May shower and wash wound with dial antibacterial soap and water prior to dressing change. Cleanser: Wound Cleanser Every Other Day/30 Days Discharge Instructions: Cleanse the wound with wound cleanser prior to applying [name] clean dressing using gauze sponges, not tissue or cotton balls. Peri-Wound Care: Sween Lotion (Moisturizing lotion) Every Other Day/30 Days Discharge Instructions: Apply Aquaphor moisturizing lotion as directed Prim Dressing: Maxorb Extra Ag+ Alginate Dressing, 2x2 (in/in) Every Other Day/30  Days ary Discharge Instructions: Apply to wound bed as instructed Secondary Dressing: Woven Gauze Sponge, Non-Sterile 4x4 in Every Other Day/30 Days Discharge Instructions: Apply over primary dressing as directed. Secured With: [employer], 4.5x3.1 (in/yd) (Generic) Every Other Day/30 Days  Discharge Instructions: Secure with Kerlix as directed. Secured With: 14M Medipore H Soft Cloth Surgical T ape, 4 x 10 (in/yd) (Generic) Every Other Day/30 Days Discharge Instructions: Secure with tape as directed. WOUND #56: - Foot Wound Laterality: Dorsal, Left Cleanser: Soap and Water Every Other Day/30 Days Discharge Instructions: May shower and wash wound with dial antibacterial soap and water prior to dressing change. Cleanser: Wound Cleanser Every Other Day/30 Days Discharge Instructions: Cleanse the wound with wound cleanser prior to applying [name] clean dressing using gauze sponges, not tissue or cotton balls. Peri-Wound Care: Sween Lotion (Moisturizing lotion) Every Other Day/30 Days Discharge Instructions: Apply Aquaphor moisturizing lotion as directed Prim Dressing: Maxorb Extra Ag+ Alginate Dressing, 2x2 (in/in) Every Other Day/30 Days ary Discharge Instructions: Apply to wound bed as instructed Secondary Dressing: Woven Gauze Sponge, Non-Sterile 4x4 in Every Other Day/30 Days Discharge Instructions: Apply over primary dressing as directed. Secured With: [employer], 4.5x3.1 (in/yd) (Generic) Every Other Day/30 Days Discharge Instructions: Secure with Kerlix as directed. Secured With: 14M Medipore H Soft Cloth Surgical T ape, 4 x 10 (in/yd) (Generic) Every Other Day/30 Days Discharge Instructions: Secure with tape as directed. WOUND #67: - Lower Leg Wound Laterality: Left, Lateral Cleanser: Soap and Water Every Other Day/30 Days Discharge Instructions: May shower and wash wound with dial antibacterial soap and water prior to dressing change. Cleanser: Wound Cleanser Every Other  Day/30 Days Discharge Instructions: Cleanse the wound with wound cleanser prior to applying [name] clean dressing using gauze sponges, not tissue or cotton balls. Peri-Wound Care: Sween Lotion (Moisturizing lotion) Every Other Day/30 Days Discharge Instructions: Apply Aquaphor moisturizing lotion as directed Prim Dressing: Maxorb Extra Ag+ Alginate Dressing, 2x2 (in/in) Every Other Day/30 Days ary Discharge Instructions: Apply to wound bed as instructed Secondary Dressing: Woven Gauze Sponge, Non-Sterile 4x4 in Every Other Day/30 Days Discharge Instructions: Apply over primary dressing as directed. Secured With: [employer], 4.5x3.1 (in/yd) (Generic) Every Other Day/30 Days Discharge Instructions: Secure with Kerlix as directed. Secured With: 14M Medipore H Soft Cloth Surgical T ape, 4 x 10 (in/yd) (Generic) Every Other Day/30 Days Discharge Instructions: Secure with tape as directed. WOUND #74: - Upper Leg Wound Laterality: Left, Posterior Cleanser: Vashe 5.8 (oz) 1 x Per Day/30 Days Discharge Instructions: Cleanse the wound with Vashe prior to applying [name] clean dressing using gauze sponges , let sit on wound for 10 minutes Peri-Wound Care: Zinc Oxide Ointment 30g tube 1 x Per Day/30 Days Discharge Instructions: Apply Zinc Oxide to periwound with each dressing change as needed fpr moisture Topical: [employer] antibiotic compound 1 x Per Day/30 Days Prim Dressing: Maxorb Extra Ag+ Alginate Dressing, 4x4.75 (in/in) 1 x Per Day/30 Days ary Discharge Instructions: Apply to wound bed as instructed Secondary Dressing: Zetuvit Plus Silicone Border Sacrum Dressing, Sm, 7x7 (in/in) 1 x Per Day/30 Days Discharge Instructions: Apply silicone border over primary dressing as directed. Secured With: 14M Medipore H Soft Cloth Surgical T ape, 4 x 10 (in/yd) 1 x Per Day/30 Days Discharge Instructions: Secure with tape as directed. WOUND #75: - Lower Leg Wound Laterality: Right, Anterior, Distal Cleanser:  Soap and Water Every Other Day/30 Days Discharge Instructions: May shower and wash wound with dial antibacterial soap and water prior to dressing change. Cleanser: Wound Cleanser Every Other Day/30 Days Discharge Instructions: Cleanse the wound with wound cleanser prior to applying [name] clean dressing using gauze sponges, not tissue or cotton balls. Peri-Wound Care: Sween Lotion (Moisturizing lotion) Every Other Day/30 Days Discharge Instructions: Apply  Aquaphor moisturizing lotion as directed Prim Dressing: Maxorb Extra Ag+ Alginate Dressing, 2x2 (in/in) Every Other Day/30 Days ary Discharge Instructions: Apply to wound bed as instructed Secondary Dressing: Woven Gauze Sponge, Non-Sterile 4x4 in Every Other Day/30 Days Discharge Instructions: Apply over primary dressing as directed. Secured With: [employer], 4.5x3.1 (in/yd) (Generic) Every Other Day/30 Days Discharge Instructions: Secure with Kerlix as directed. Secured With: 80M Medipore H Soft Cloth Surgical T ape, 4 x 10 (in/yd) (Generic) Every Other Day/30 Days Discharge Instructions: Secure with tape as directed. WOUND #76: - Lower Leg Wound Laterality: Right, Lateral Cleanser: Soap and Water Every Other Day/30 Days Discharge Instructions: May shower and wash wound with dial antibacterial soap and water prior to dressing change. Cleanser: Wound Cleanser Every Other Day/30 Days Discharge Instructions: Cleanse the wound with wound cleanser prior to applying [name] clean dressing using gauze sponges, not tissue or cotton balls. Peri-Wound Care: Sween Lotion (Moisturizing lotion) Every Other Day/30 Days [place], [place] [name] ([id number]) [phone].pdf Page 36 of 38 Discharge Instructions: Apply Aquaphor moisturizing lotion as directed Prim Dressing: Maxorb Extra Ag+ Alginate Dressing, 2x2 (in/in) Every Other Day/30 Days ary Discharge Instructions: Apply to wound bed as instructed Secondary Dressing: Woven Gauze Sponge,  Non-Sterile 4x4 in Every Other Day/30 Days Discharge Instructions: Apply over primary dressing as directed. Secured With: [employer], 4.5x3.1 (in/yd) (Generic) Every Other Day/30 Days Discharge Instructions: Secure with Kerlix as directed. Secured With: 80M Medipore H Soft Cloth Surgical T ape, 4 x 10 (in/yd) (Generic) Every Other Day/30 Days Discharge Instructions: Secure with tape as directed. 02/15/2023:The left lateral and right lateral ankle wounds are both smaller. The right anterior ankle wound has some eschar on the surface. Both dorsal feet have [name] lot of slough accumulation. 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 I used [name] curette to debride slough off of both lateral ankle wounds and both dorsal foot wounds. I debrided eschar off of the anterior ankle wound. The ischial wound did not require debridement. We will continue silver alginate to all sites and as soon as his new [employer] compound (prescription drug) arrives, he will begin using it on the ischium. Follow-up in 2 [name]. Electronic Signature(s) Signed: 02/15/2023 9:56:56 AM By: [name] Maudlin MD FACS Previous Signature: 02/15/2023 9:56:18 AM Version By: [name] Maudlin MD FACS Entered By: [name] on 02/15/2023 09:56:56 -------------------------------------------------------------------------------- HxROS Details Patient Name: Date of Service: Burress, [name] 02/15/2023 8:15 [name] Medical Record Number: [id number] Patient Account Number: [id number] Date of Birth/Sex: Treating RN: 03/11/1986 (37 y.o. M) Primary Care Provider: [place], [place] Other Clinician: Referring Provider: Treating Provider/Extender: [name] in Treatment: 371 Information Obtained From Patient Ear/Nose/Mouth/Throat Medical History: Negative for: Chronic sinus problems/congestion; Middle ear  problems Hematologic/Lymphatic Medical History: Negative for: Anemia; Hemophilia; Human Immunodeficiency Virus; Lymphedema; Sickle Cell Disease Respiratory Medical History: Positive for: Sleep Apnea - does not tolerate cpap Negative for: Aspiration; Asthma; Chronic Obstructive Pulmonary Disease (COPD); Pneumothorax; Tuberculosis Cardiovascular Medical History: Positive for: Hypertension - lisinopril HCTZ Negative for: Angina; Arrhythmia; Congestive Heart Failure; Coronary Artery Disease; Deep Vein Thrombosis; Hypotension; Myocardial Infarction; Peripheral Arterial Disease; Peripheral Venous Disease; Phlebitis; Vasculitis Gastrointestinal Medical History: Negative for: Cirrhosis ; Colitis; Crohns; Hepatitis [name]; Hepatitis B; Hepatitis C Endocrine Medical History: Negative for: Type I Diabetes; Type II Diabetes Genitourinary Medical History: Negative for: End Stage Renal Disease Newhouse, Daxter [name] ([id number].pdf Page 37 of 38 Immunological  Medical History: Negative for: Lupus Erythematosus; Raynauds; Scleroderma Integumentary (Skin) Medical History: Negative for: History of Burn Musculoskeletal Medical History: Negative for: Gout; Rheumatoid Arthritis; Osteoarthritis; Osteomyelitis Neurologic Medical History: Positive for: Paraplegia Negative for: Dementia; Neuropathy; Quadriplegia; Seizure Disorder Oncologic Medical History: Negative for: Received Chemotherapy; Received Radiation Psychiatric Medical History: Negative for: Anorexia/bulimia; Confinement Anxiety Immunizations Pneumococcal Vaccine: Received Pneumococcal Vaccination: No Immunization Notes: doesn't remember when last tetanus shot was Implantable Devices No devices added Hospitalization / Surgery History Type of Hospitalization/Surgery cellulitis in leg left leg vein ablation Family and Social History Cancer: No; Diabetes: Yes - Father; Heart Disease: No; Hereditary  Spherocytosis: No; Hypertension: Yes - Mother; Kidney Disease: No; Lung Disease: No; Stroke: No; Thyroid Problems: No; Tuberculosis: No; Former smoker; Marital Status - Married; Alcohol Use: Moderate; Drug Use: No History; Caffeine Use: Daily; Financial Concerns: No; Food, Clothing or Shelter Needs: No; Support System Lacking: No; Transportation Concerns: No Electronic Signature(s) Signed: 02/15/2023 9:58:24 AM By: [name] Maudlin MD FACS Entered By: [name] on 02/15/2023 09:53:28 -------------------------------------------------------------------------------- SuperBill Details Patient Name: Date of Service: Riedinger, [name] 02/15/2023 Medical Record Number: [id number] Patient Account Number: [id number] Date of Birth/Sex: Treating RN: July 16, 1986 (37 y.o. M) Primary Care Provider: [place], [place] Other Clinician: Referring Provider: Treating Provider/Extender: [name] in Treatment: 371 Diagnosis Coding ICD-10 Codes Code Description I87.332 Chronic venous hypertension (idiopathic) with ulcer and inflammation of left lower extremity L97.511 Non-pressure chronic ulcer of other part of right foot limited to breakdown of skin Dettmann, Griffon [name] ([id number]) [phone].pdf Page 38 of 38 L89.323 Pressure ulcer of left buttock, 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 with other 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, complete L97.522 Non-pressure chronic ulcer of other part of left foot with fat layer exposed 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 L98.492 Non-pressure chronic ulcer of skin of other sites with fat layer exposed Facility Procedures : CPT4 Code: [id number] Description: [id number] - DEBRIDE WOUND 1ST 20 SQ CM OR < ICD-10 Diagnosis Description L97.522 Non-pressure chronic ulcer of other part of left foot  with fat layer exposed L97.312 Non-pressure chronic ulcer of right ankle with fat layer exposed L97.322  Non-pressure chronic ulcer of left ankle with fat layer exposed L97.518 Non-pressure chronic ulcer of other part of right foot with other specified sever Modifier: ity Quantity: 1 Physician Procedures : CPT4 Code Description Modifier [id number] - WC PHYS LEVEL 4 - EST PT 25 ICD-10 Diagnosis Description L97.522 Non-pressure chronic ulcer of other part of left foot with fat layer exposed L97.312 Non-pressure chronic ulcer of right ankle with fat  layer exposed L97.322 Non-pressure chronic ulcer of left ankle with fat layer exposed L97.518 Non-pressure chronic ulcer of other part of right foot with other specified severity Quantity: 1 : [id number] - WC PHYS DEBR WO ANESTH 20 SQ CM ICD-10 Diagnosis Description L97.522 Non-pressure chronic ulcer of other part of left foot with fat layer exposed L97.312 Non-pressure chronic ulcer of right ankle with fat layer exposed L97.322  Non-pressure chronic ulcer of left ankle with fat layer exposed L97.518 Non-pressure chronic ulcer of other part of right foot with other specified severity Quantity: 1 Electronic Signature(s) Signed: 02/15/2023 9:57:46 AM By: [name] Maudlin MD FACS Entered By: [name] on 02/15/2023 09:57:44

## 2023-03-01 ENCOUNTER — Encounter: Payer: Self-pay | Admitting: Behavioral Health

## 2023-03-01 ENCOUNTER — Ambulatory Visit (INDEPENDENT_AMBULATORY_CARE_PROVIDER_SITE_OTHER): Payer: BC Managed Care – PPO | Admitting: Behavioral Health

## 2023-03-01 ENCOUNTER — Encounter (HOSPITAL_BASED_OUTPATIENT_CLINIC_OR_DEPARTMENT_OTHER): Payer: BC Managed Care – PPO | Admitting: General Surgery

## 2023-03-01 DIAGNOSIS — F341 Dysthymic disorder: Secondary | ICD-10-CM | POA: Diagnosis not present

## 2023-03-01 MED ORDER — SERTRALINE HCL 100 MG PO TABS: ORAL_TABLET | ORAL | 3 refills | Status: DC

## 2023-03-01 NOTE — Progress Notes (Signed)
[facility]  Patient ID: BANNER HOLSOPPLE,  MRN: [id number]  PCP: Eunice Blase, PA-C  Date of Evaluation: 03/01/2023 Time spent:30 minutes  Chief Complaint:  Chief Complaint   Anxiety; Depression; Follow-up; Medication Refill; Patient Education     HISTORY/CURRENT STATUS: HPI  "[name]", 37 year old male presents to this office for follow up and medication management. No changes this visit. Says everything is going well. He is requesting no medication changes  this visit and would like to continue to monitor. He reports 1/10 depression, and 0/10 anxiety. He sleeps 7 plus hours per night. Denies any significant social changes. He is wheelchair bound and has non healing wounds on both legs bilaterally. He goes to wound care once weekly. No changes in his social life. He denies mania, no psychosis. No SI/HI.    No prior psychiatric medication failures     Individual Medical History/ Review of Systems: Changes? :No   Allergies: Sulfa antibiotics, Levofloxacin, Meropenem, Penicillins, and Zyvox [linezolid]  Current Medications:  Current Outpatient Medications:    Amino Acids-Protein Hydrolys (FEEDING SUPPLEMENT, PRO-STAT SUGAR FREE 64,) LIQD, Take 30 mLs by mouth 2 (two) times daily., Disp: , Rfl:    Ascorbic Acid (VITAMIN C) 1000 MG tablet, Take 1,000 mg by mouth daily., Disp: , Rfl:    baclofen (LIORESAL) 20 MG tablet, Take 40-60 mg by mouth 3 (three) times daily. 60mg  in the morning, 40mg  in the afternoon, and 60mg  in the evening, Disp: , Rfl:    imipramine (TOFRANIL) 50 MG tablet, Take 50 mg by mouth at bedtime. , Disp: , Rfl:    lisinopril (ZESTRIL) 10 MG tablet, Take 10 mg by mouth daily., Disp: , Rfl:    Melatonin 10 MG CAPS, Take by mouth. Nightly, Disp: , Rfl:    Multiple Vitamin (MULTIVITAMIN) tablet, Take 1 tablet by mouth daily., Disp: , Rfl:    oxybutynin (DITROPAN XL) 15 MG 24 hr tablet, Take 30 mg by mouth at bedtime. , Disp: , Rfl:    zinc sulfate 220 (50 Zn)  MG capsule, Take 220 mg by mouth daily., Disp: , Rfl:    sertraline (ZOLOFT) 100 MG tablet, TAKE 1 AND 1/2 TABLETS(150 MG) BY MOUTH DAILY, Disp: 45 tablet, Rfl: 1 Medication Side Effects: none  Family Medical/ Social History: Changes? No  MENTAL HEALTH EXAM:  There were no vitals taken for this visit.There is no height or weight on file to calculate BMI.  General Appearance: Casual, Neat, and Well Groomed  Eye Contact:  Good  Speech:  Clear and Coherent  Volume:  Normal  Mood:  Anxious and Depressed  Affect:  Appropriate  Thought Process:  Coherent  Orientation:  Full (Time, Place, and Person)  Thought Content: Logical   Suicidal Thoughts:  No  Homicidal Thoughts:  No  Memory:  WNL  Judgement:  Good  Insight:  Good  Psychomotor Activity:  Normal  Concentration:  Concentration: Good  Recall:  Good  Fund of Knowledge: Good  Language: Good  Assets:  Desire for Improvement  ADL's:  Intact  Cognition: WNL  Prognosis:  Good    DIAGNOSES:    ICD-10-CM   1. Persistent depressive disorder with melancholic features, currently mild  F34.1       Receiving Psychotherapy: No    RECOMMENDATIONS:   Greater than 50% of  30 min face to face time with patient was spent on counseling and coordination of care. We discussed his recent significant improvement since his last visit.  He  is very happy with how the increase of Zoloft has been working to reduce his anxiety and depression.  Will continue Zoloft to 150 mg  daily.  Will report worsening symptoms promptly Will follow up in 3 months to reassess Provided emergency contact information Reviewed PDMP      Joan Flores, NP

## 2023-03-08 ENCOUNTER — Encounter (HOSPITAL_BASED_OUTPATIENT_CLINIC_OR_DEPARTMENT_OTHER): Payer: BC Managed Care – PPO | Attending: General Surgery | Admitting: General Surgery

## 2023-03-08 DIAGNOSIS — L89323 Pressure ulcer of left buttock, stage 3: Secondary | ICD-10-CM | POA: Diagnosis not present

## 2023-03-08 DIAGNOSIS — L97822 Non-pressure chronic ulcer of other part of left lower leg with fat layer exposed: Secondary | ICD-10-CM | POA: Diagnosis not present

## 2023-03-08 DIAGNOSIS — G8221 Paraplegia, complete: Secondary | ICD-10-CM | POA: Insufficient documentation

## 2023-03-08 DIAGNOSIS — L97812 Non-pressure chronic ulcer of other part of right lower leg with fat layer exposed: Secondary | ICD-10-CM | POA: Diagnosis not present

## 2023-03-08 DIAGNOSIS — I87333 Chronic venous hypertension (idiopathic) with ulcer and inflammation of bilateral lower extremity: Secondary | ICD-10-CM | POA: Insufficient documentation

## 2023-03-08 DIAGNOSIS — L97322 Non-pressure chronic ulcer of left ankle with fat layer exposed: Secondary | ICD-10-CM | POA: Insufficient documentation

## 2023-03-08 DIAGNOSIS — L97522 Non-pressure chronic ulcer of other part of left foot with fat layer exposed: Secondary | ICD-10-CM | POA: Insufficient documentation

## 2023-03-08 DIAGNOSIS — L97518 Non-pressure chronic ulcer of other part of right foot with other specified severity: Secondary | ICD-10-CM | POA: Insufficient documentation

## 2023-03-08 DIAGNOSIS — L97512 Non-pressure chronic ulcer of other part of right foot with fat layer exposed: Secondary | ICD-10-CM | POA: Diagnosis not present

## 2023-03-08 DIAGNOSIS — L97511 Non-pressure chronic ulcer of other part of right foot limited to breakdown of skin: Secondary | ICD-10-CM | POA: Insufficient documentation

## 2023-03-08 DIAGNOSIS — I872 Venous insufficiency (chronic) (peripheral): Secondary | ICD-10-CM | POA: Diagnosis not present

## 2023-03-09 NOTE — Progress Notes (Signed)
[name] [name] ([id number]) [phone].pdf Page 1 of 15 Visit Report for 03/08/2023 Arrival Information Details Patient Name: Date of Service: [name] [name], [name] LEX [name]. 03/08/2023 7:30 [name] M Medical Record Number: [id number] Patient Account Number: [id number] Date of Birth/Sex: Treating RN: [birth year] (37 y.o. Dianna Limbo Primary Care Kelsha Older: [name], [name] Other Clinician: Referring Cortne Amara: Treating Orman Matsumura/Extender: Fredderick Severance [name]: 374 Visit Information History Since Last Visit Added or deleted any medications: No Patient Arrived: Wheel Chair Any new allergies or adverse reactions: No Arrival Time: 07:40 Had [name] fall or experienced change in No Accompanied By: self activities of daily living that may affect Transfer Assistance: None risk of falls: Patient Requires Transmission-Based Precautions: No Signs or symptoms of abuse/neglect since last visito No Patient Has Alerts: Yes Hospitalized since last visit: No Patient Alerts: R ABI = 1.0 Implantable device outside of the clinic excluding No L ABI = 1.1 cellular tissue based products placed in the center since last visit: Has Dressing in Place as Prescribed: Yes Has Compression in Place as Prescribed: Yes Pain Present Now: No Electronic Signature(s) Signed: 03/08/2023 3:41:18 PM By: Karie Schwalbe RN Entered By: Karie Schwalbe on 03/08/2023 07:40:41 -------------------------------------------------------------------------------- Encounter Discharge Information Details Patient Name: Date of Service: [name] [name] LEX [name]. 03/08/2023 7:30 [name] M Medical Record Number: [id number] Patient Account Number: [id number] Date of Birth/Sex: Treating RN: Apr 05, 1986 (37 y.o. Damaris Schooner Primary Care Audel Coakley: [name], [name] Other Clinician: Referring Jalysa Swopes: Treating Charlie Seda/Extender: Fredderick Severance [name]: [id number] Encounter Discharge Information Items Post  Procedure Vitals Discharge Condition: Stable Temperature (F): 97.9 Ambulatory Status: Wheelchair Pulse (bpm): 109 Discharge Destination: Home Respiratory Rate (breaths/min): 18 Transportation: Private Auto Blood Pressure (mmHg): 124/68 Accompanied By: self Schedule Follow-up Appointment: Yes Clinical Summary of Care: Patient Declined Electronic Signature(s) Signed: 03/08/2023 4:58:18 PM By: Zenaida Deed RN, BSN Entered By: Zenaida Deed on 03/08/2023 08:45:45 -------------------------------------------------------------------------------- Lower Extremity Assessment Details Patient Name: Date of Service: [name] [name] LEX [name]. 03/08/2023 7:30 [name] M Medical Record Number: [id number] Patient Account Number: [id number] Date of Birth/Sex: Treating RN: 05-04-1986 (37 y.o. Dianna Limbo Primary Care Janie Capp: [name], [name] Other Clinician: Referring Alverto Shedd: Treating Jacqulyne Gladue/Extender: Benjamine Mola, [name]: 374 Edema Assessment C[Left: WELLS, MABE ([id number])] [Right: [id number] Page 2 of 15] Assessed: [Left: No] [Right: No] Edema: [Left: Yes] [Right: Yes] Calf Left: Right: Point of Measurement: 33 cm From Medial Instep 29 cm 32 cm Ankle Left: Right: Point of Measurement: 10 cm From Medial Instep 25.4 cm 24.1 cm Vascular Assessment Pulses: Dorsalis Pedis Palpable: [Left:Yes] [Right:Yes] Electronic Signature(s) Signed: 03/08/2023 3:41:18 PM By: Karie Schwalbe RN Entered By: Karie Schwalbe on 03/08/2023 07:50:12 -------------------------------------------------------------------------------- Multi Wound Chart Details Patient Name: Date of Service: [name] [name] LEX [name]. 03/08/2023 7:30 [name] M Medical Record Number: [id number] Patient Account Number: [id number] Date of Birth/Sex: Treating RN: Jun 16, 1986 (37 y.o. 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 Primary Etiology: Sleep Apnea, Hypertension, Paraplegia Sleep Apnea, Hypertension, Paraplegia Sleep Apnea, Hypertension, Paraplegia Comorbid History: 03/27/2021 07/11/2021 06/22/2022 Date Acquired: 101 85 36 Weeks of Treatment: Open Open Open Wound Status: No No No Wound  Recurrence: No Yes No Clustered Wound: 1.1x3x0.1 3.3x3.5x0.1 0.4x0.4x0.1 Measurements L x W x D (cm) 2.592 9.071 0.126 [name] (cm) : rea 0.259 0.907 0.013 Volume (cm) : -37.50% -45.80% 97.80% % Reduction in [name] rea: -37.80% -45.80% 97.70% % Reduction in Volume: Full Thickness Without Exposed Full Thickness Without Exposed Full Thickness Without Exposed Classification: Support Structures Support Structures Support Structures Medium Medium Small Exudate Amount: Serosanguineous Serosanguineous Serosanguineous Exudate Type: red, brown red, brown red, brown Exudate Color: N/[name] No No Foul Odor [name] Cleansing: fter N/[name] N/[name] N/[name] Odor Anticipated Due to Product Use: Flat and Intact Flat and Intact Distinct, outline attached Wound Margin: Small (1-33%) Medium (34-66%) Small (1-33%) Granulation [name] mount: [name] [name] ([id number]) [id number] Page 3 of 15 Pink Red Red Granulation Quality: Large (67-100%) Medium (34-66%) Large (67-100%) Necrotic Amount: Adherent Slough N/[name] Adherent Slough Necrotic Tissue: Fat Layer (Subcutaneous Tissue): Yes Fat Layer (Subcutaneous Tissue): Yes Fat Layer (Subcutaneous Tissue): Yes Exposed Structures: Fascia:  No Fascia: No Fascia: No Tendon: No Tendon: No Tendon: No Muscle: No Muscle: No Muscle: No Joint: No Joint: No Joint: No Bone: No Bone: No Bone: No Small (1-33%) Small (1-33%) Large (67-100%) Epithelialization: Debridement - Selective/Open Wound Debridement - Selective/Open Wound Debridement - Selective/Open Wound Debridement: Pre-procedure Verification/Time Out 08:15 08:15 08:15 Taken: [name] Tissue Debrided: Non-Viable Tissue Non-Viable Tissue Non-Viable Tissue Level: 3.3 11.55 0.16 Debridement [name] (sq cm): rea Curette Curette Curette Instrument: Minimum Minimum Minimum Bleeding: Pressure Pressure Pressure Hemostasis [name] chieved: Insensate Insensate Insensate Procedural Pain: Insensate Insensate Insensate Post Procedural Pain: Procedure was tolerated well Procedure was tolerated well Procedure was tolerated well Debridement Treatment Response: 1.1x3x0.1 3.3x3.5x0.1 0.4x0.4x0.1 Post Debridement Measurements L x W x D (cm) 0.259 0.907 0.013 Post Debridement Volume: (cm) No Abnormalities Noted No Abnormalities Noted No Abnormalities Noted Periwound Skin Texture: Dry/Scaly: Yes Dry/Scaly: Yes Dry/Scaly: Yes Periwound Skin Moisture: Maceration: No Erythema: No Erythema: No Erythema: No Periwound Skin Color: N/[name] N/[name] N/[name] Erythema Measurement: No Abnormality No Abnormality No Abnormality Temperature: Debridement Debridement Debridement Procedures Performed: Wound Number: [id number] Photos: Left, Posterior Upper Leg Right, Distal, Anterior Lower Leg Right, Lateral Lower Leg Wound Location: Pressure Injury Gradually Appeared Pressure Injury Wounding Event: Pressure Ulcer Venous Leg Ulcer Venous Leg Ulcer Primary Etiology: Sleep Apnea, Hypertension, Paraplegia Sleep Apnea, Hypertension, Paraplegia Sleep Apnea, Hypertension, Paraplegia Comorbid History: 01/10/2023 02/01/2023 02/08/2023 Date Acquired: Weeks of Treatment: Open Healed -  Epithelialized Open Wound Status: No No No Wound Recurrence: No No No Clustered Wound: 9.5x4.5x0.1 0x0x0 0.3x0.2x0.1 Measurements L x W x D (cm) 33.576 0 0.047 [name] (cm) : rea 3.358 0 0.005 Volume (cm) : 55.00% 100.00% 90.50% % Reduction in [name] rea: 55.00% 100.00% 89.80% % Reduction in Volume: Category/Stage III Full Thickness Without Exposed Full Thickness Without Exposed Classification: Support Structures Support Structures Large Medium Medium Exudate [name] mount: Serosanguineous Serosanguineous Serosanguineous Exudate Type: red, brown red, brown red, brown Exudate Color: Yes No No Foul Odor [name] Cleansing: fter No N/[name] N/[name] Odor Anticipated Due to Product Use: Flat and Intact Distinct, outline attached Flat and Intact Wound Margin: Large (67-100%) Medium (34-66%) Small (1-33%) Granulation [name] mount: Red, Friable Red Pink Granulation Quality: Small (1-33%) Medium (34-66%) Large (67-100%) Necrotic Amount: Adherent Slough Eschar Eschar Necrotic Tissue: Fat Layer (Subcutaneous Tissue): Yes Fat Layer (Subcutaneous Tissue): Yes Fat Layer (Subcutaneous Tissue): Yes Exposed Structures: Fascia: No Fascia: No Tendon: No Tendon: No Muscle: No Muscle: No Joint: No Joint: No Bone: No Bone: No Small (1-33%)  Small (1-33%) Small (1-33%) Epithelialization: N/[name] N/[name] Debridement - Selective/Open Wound Debridement: Pre-procedure Verification/Time Out N/[name] N/[name] 08:15 Taken: N/[name] N/[name] Slough Tissue Debrided: N/[name] N/[name] Non-Viable Tissue Level: N/[name] N/[name] 0.06 Debridement [name] (sq cm): rea N/[name] N/[name] Curette Instrument: N/[name] N/[name] Minimum Bleeding: Lumley, Jarad [name] ([id number]) [id number] Page 4 of 15 N/[name] N/[name] Pressure Hemostasis [name] chieved: N/[name] N/[name] Insensate Procedural Pain: N/[name] N/[name] Insensate Post Procedural Pain: N/[name] N/[name] Procedure was tolerated well Debridement Treatment Response: N/[name] N/[name] 0.3x0.2x0.1 Post Debridement Measurements L x W x D (cm) N/[name] N/[name] 0.005 Post  Debridement Volume: (cm) Scarring: Yes No Abnormalities Noted No Abnormalities Noted Periwound Skin Texture: No Abnormalities Noted Dry/Scaly: Yes Dry/Scaly: Yes Periwound Skin Moisture: No Abnormalities Noted Erythema: Yes No Abnormalities Noted Periwound Skin Color: N/[name] Measured: 7cm N/[name] Erythema Measurement: No Abnormality No Abnormality N/[name] Temperature: N/[name] N/[name] Debridement Procedures Performed: Treatment Notes Electronic Signature(s) Signed: 03/08/2023 8:26:07 AM By: Duanne Guess MD FACS Entered By: Duanne Guess on 03/08/2023 08:26:06 -------------------------------------------------------------------------------- Multi-Disciplinary Care Plan Details Patient Name: Date of Service: Gravley, [name] LEX [name]. 03/08/2023 7:30 [name] M Medical Record Number: [id number] Patient Account Number: [id number] Date of Birth/Sex: Treating RN: 04-Apr-1986 (37 y.o. Damaris Schooner Primary Care Yee Joss: [name], [name] Other Clinician: Referring Jaki Steptoe: Treating Ersel Wadleigh/Extender: Fredderick Severance [name]: 374 Multidisciplinary Care Plan reviewed with physician Active Inactive Pressure Nursing Diagnoses: Knowledge deficit related to management of pressures ulcers Potential for impaired tissue integrity related to pressure, friction, moisture, and shear Goals: Patient will remain free from development of additional pressure ulcers Date Initiated: 06/21/2018 Date Inactivated: 02/12/2019 Target Resolution Date: 02/16/2019 Unmet Reason: pressure ulcer Goal Status: Unmet reopened on left ischium Patient/caregiver will verbalize risk factors for pressure ulcer development Date Initiated: 06/21/2018 Date Inactivated: 05/28/2019 Target Resolution Date: 06/01/2019 Goal Status: Met Patient/caregiver will verbalize understanding of pressure ulcer management Date Initiated: 06/01/2022 Target Resolution Date: 03/15/2023 Goal Status: Active Interventions: Assess: immobility,  friction, shearing, incontinence upon admission and as needed Assess offloading mechanisms upon admission and as needed Assess potential for pressure ulcer upon admission and as needed Treatment Activities: Pressure reduction/relief device ordered : 12/05/2017 Notes: Wound/Skin Impairment Nursing Diagnoses: Impaired tissue integrity Knowledge deficit related to ulceration/compromised skin integrity Goals: Patient/caregiver will verbalize understanding of skin care regimen Date Initiated: 06/01/2022 Target Resolution Date: 03/15/2023 Goal Status: Active Stowers, Kelden [name] ([id number]) [phone].pdf Page 5 of 15 Ulcer/skin breakdown will have [name] volume reduction of 30% by week 4 Date Initiated: 06/01/2022 Date Inactivated: 06/29/2022 Target Resolution Date: 06/29/2022 Unmet Reason: complex wounds, Goal Status: Unmet infection Interventions: Assess patient/caregiver ability to obtain necessary supplies Assess ulceration(s) every visit Provide education on ulcer and skin care Notes: 02/02/21: Complex Care, ongoing. Electronic Signature(s) Signed: 03/08/2023 4:58:18 PM By: Zenaida Deed RN, BSN Entered By: Zenaida Deed on 03/08/2023 08:27:12 -------------------------------------------------------------------------------- Pain Assessment Details Patient Name: Date of Service: Rickett, [name] LEX [name]. 03/08/2023 7:30 [name] M Medical Record Number: [id number] Patient Account Number: [id number] Date of Birth/Sex: Treating RN: 24-May-1986 (37 y.o. Dianna Limbo Primary Care Gabrella Stroh: [name], [name] Other Clinician: Referring Elvina Bosch: Treating Jordi Lacko/Extender: Fredderick Severance [name]: 374 Active Problems Location of Pain Severity and Description of Pain Patient Has Paino No Site Locations Pain Management and Medication Current Pain Management: Electronic Signature(s) Signed: 03/08/2023 3:41:18 PM By: Karie Schwalbe RN Entered By: Karie Schwalbe on  03/08/2023 07:43:31 -------------------------------------------------------------------------------- Patient/Caregiver Education Details Patient Name: Date of Service: Ebanks, [name] LEX [name]. 4/16/2024andnbsp7:30 [name] M Medical Record Number: [id number] Patient Account Number:  [id number] Date of Birth/Gender: Treating RN: 1986-07-16 (37 y.o. Damaris Schooner Primary Care Physician: Eunice Blase Other Clinician: Referring Physician: Treating Physician/Extender: Fredderick Severance [name]: [address] Education Assessment Manfredi, [name] ([id number]) [id number] Page 6 of 15 Education Provided To: Patient Education Topics Provided Pressure: Methods: Explain/Verbal Responses: Reinforcements needed, State content correctly Wound/Skin Impairment: Methods: Explain/Verbal Responses: Reinforcements needed, State content correctly Electronic Signature(s) Signed: 03/08/2023 4:58:18 PM By: Zenaida Deed RN, BSN Entered By: Zenaida Deed on 03/08/2023 08:28:19 -------------------------------------------------------------------------------- Wound Assessment Details Patient Name: Date of Service: Lollis, [name] LEX [name]. 03/08/2023 7:30 [name] M Medical Record Number: [id number] Patient Account Number: [id number] Date of Birth/Sex: Treating RN: [birth year] (37 y.o. Dianna Limbo Primary Care Khadeeja Elden: [name], [name] Other Clinician: Referring Sebastian Dzik: Treating Seleena Reimers/Extender: Benjamine Mola, [name]: 374 Wound Status Wound Number: 52 Primary Etiology: Venous Leg Ulcer Wound Location: Right, Dorsal Foot Wound Status: Open Wounding Event: Gradually Appeared Comorbid History: Sleep Apnea, Hypertension, Paraplegia Date Acquired: 03/27/2021 Weeks Of Treatment: 101 Clustered Wound: No Photos Wound Measurements Length: (cm) 1.1 Width: (cm) 3 Depth: (cm) 0.1 Area: (cm) 2.592 Volume: (cm) 0.259 % Reduction in Area: -37.5% % Reduction in  Volume: -37.8% Epithelialization: Small (1-33%) Tunneling: No Undermining: No Wound Description Classification: Full Thickness Without Exposed Su Wound Margin: Flat and Intact Exudate Amount: Medium Exudate Type: Serosanguineous Exudate Color: red, brown pport Structures Wound Bed Granulation Amount: Small (1-33%) Exposed Structure Granulation Quality: Pink Fascia Exposed: No Necrotic Amount: Large (67-100%) Fat Layer (Subcutaneous Tissue) Exposed: Yes Necrotic Quality: Adherent Slough Tendon Exposed: No Muscle Exposed: No Joint Exposed: No Boardley, Ibn [name] ([id number]) [id number] Page 7 of 15 Bone Exposed: No Periwound Skin Texture Texture Color No Abnormalities Noted: Yes No Abnormalities Noted: Yes Moisture Temperature / Pain No Abnormalities Noted: No Temperature: No Abnormality Dry / Scaly: Yes Treatment Notes Wound #52 (Foot) Wound Laterality: Dorsal, Right Cleanser Soap and Water Discharge Instruction: May shower and wash wound with dial antibacterial soap and water prior to dressing change. Wound Cleanser Discharge Instruction: Cleanse the wound with wound cleanser prior to applying [name] clean dressing using gauze sponges, not tissue or cotton balls. Peri-Wound Care Sween Lotion (Moisturizing lotion) Discharge Instruction: Apply Aquaphor moisturizing lotion as directed Topical Mupirocin Ointment Discharge Instruction: Apply Mupirocin (Bactroban) as instructed Primary Dressing Maxorb Extra Ag+ Alginate Dressing, 2x2 (in/in) Discharge Instruction: Apply to wound bed as instructed Secondary Dressing Woven Gauze Sponge, Non-Sterile 4x4 in Discharge Instruction: Apply over primary dressing as directed. Secured With [employer], 4.5x3.1 (in/yd) Discharge Instruction: Secure with Kerlix as directed. 7M Medipore H Soft Cloth Surgical T ape, 4 x 10 (in/yd) Discharge Instruction: Secure with tape as directed. Compression  Wrap Compression Stockings Add-Ons Electronic Signature(s) Signed: 03/08/2023 3:41:18 PM By: Karie Schwalbe RN Entered By: Karie Schwalbe on 03/08/2023 08:03:57 -------------------------------------------------------------------------------- Wound Assessment Details Patient Name: Date of Service: Chait, [name] LEX [name]. 03/08/2023 7:30 [name] M Medical Record Number: [id number] Patient Account Number: [id number] Date of Birth/Sex: Treating RN: 1986/01/16 (37 y.o. Damaris Schooner Primary Care Shriley Joffe: [name], [name] Other Clinician: Referring Lekeshia Kram: Treating Andrw Mcguirt/Extender: Benjamine Mola, [name]: 374 Wound Status Wound Number: 56 Primary Etiology: Neuropathic Ulcer-Non Diabetic Wound Location: Left, Dorsal Foot Wound Status: Open Wounding Event: Gradually Appeared Comorbid History: Sleep Apnea, Hypertension, Paraplegia Date Acquired: 07/11/2021 Weeks Of Treatment: 85 Clustered Wound: Yes Photos Zappulla, Atwell [name] ([id number]) [id number] Page 8 of 15 Wound Measurements Length: (cm) 3.3 Width: (cm) 3.5 Depth: (cm) 0.1 Area: (cm)  9.071 Volume: (cm) 0.907 % Reduction in Area: -45.8% % Reduction in Volume: -45.8% Epithelialization: Small (1-33%) Tunneling: No Undermining: No Wound Description Classification: Full Thickness Without Exposed Support Structures Wound Margin: Flat and Intact Exudate Amount: Medium Exudate Type: Serosanguineous Exudate Color: red, brown Foul Odor After Cleansing: No Slough/Fibrino Yes Wound Bed Granulation Amount: Medium (34-66%) Exposed Structure Granulation Quality: Red Fascia Exposed: No Necrotic Amount: Medium (34-66%) Fat Layer (Subcutaneous Tissue) Exposed: Yes Tendon Exposed: No Muscle Exposed: No Joint Exposed: No Bone Exposed: No Periwound Skin Texture Texture Color No Abnormalities Noted: Yes No Abnormalities Noted: No Erythema: No Moisture No Abnormalities Noted: No  Temperature / Pain Dry / Scaly: Yes Temperature: No Abnormality Maceration: No Treatment Notes Wound #56 (Foot) Wound Laterality: Dorsal, Left Cleanser Soap and Water Discharge Instruction: May shower and wash wound with dial antibacterial soap and water prior to dressing change. Wound Cleanser Discharge Instruction: Cleanse the wound with wound cleanser prior to applying [name] clean dressing using gauze sponges, not tissue or cotton balls. Peri-Wound Care Sween Lotion (Moisturizing lotion) Discharge Instruction: Apply Aquaphor moisturizing lotion as directed Topical Mupirocin Ointment Discharge Instruction: Apply Mupirocin (Bactroban) as instructed Primary Dressing Maxorb Extra Ag+ Alginate Dressing, 2x2 (in/in) Discharge Instruction: Apply to wound bed as instructed Secondary Dressing Woven Gauze Sponge, Non-Sterile 4x4 in Discharge Instruction: Apply over primary dressing as directed. Secured With [employer], 4.5x3.1 (in/yd) Discharge Instruction: Secure with Kerlix as directed. Zaring, Herschell [name] ([id number]) [phone].pdf Page 9 of 15 94M Medipore H Soft Cloth Surgical T ape, 4 x 10 (in/yd) Discharge Instruction: Secure with tape as directed. Compression Wrap Compression Stockings Add-Ons Electronic Signature(s) Signed: 03/08/2023 4:58:18 PM By: Zenaida Deed RN, BSN Entered By: Zenaida Deed on 03/08/2023 08:23:49 -------------------------------------------------------------------------------- Wound Assessment Details Patient Name: Date of Service: Lad, [name] LEX [name]. 03/08/2023 7:30 [name] M Medical Record Number: [id number] Patient Account Number: [id number] Date of Birth/Sex: Treating RN: 1986/07/15 (37 y.o. Dianna Limbo Primary Care Dao Memmott: [name], [name] Other Clinician: Referring Daton Szilagyi: Treating Djeneba Barsch/Extender: Benjamine Mola, [name]: 374 Wound Status Wound Number: 67 Primary Etiology: Venous Leg  Ulcer Wound Location: Left, Lateral Lower Leg Wound Status: Open Wounding Event: Gradually Appeared Comorbid History: Sleep Apnea, Hypertension, Paraplegia Date Acquired: 06/22/2022 Weeks Of Treatment: 36 Clustered Wound: No Photos Wound Measurements Length: (cm) 0.4 Width: (cm) 0.4 Depth: (cm) 0.1 Area: (cm) 0.126 Volume: (cm) 0.013 % Reduction in Area: 97.8% % Reduction in Volume: 97.7% Epithelialization: Large (67-100%) Tunneling: No Undermining: No Wound Description Classification: Full Thickness Without Exposed Suppor Wound Margin: Distinct, outline attached Exudate Amount: Small Exudate Type: Serosanguineous Exudate Color: red, brown t Structures Foul Odor After Cleansing: No Slough/Fibrino No Wound Bed Granulation Amount: Small (1-33%) Exposed Structure Granulation Quality: Red Fascia Exposed: No Necrotic Amount: Large (67-100%) Fat Layer (Subcutaneous Tissue) Exposed: Yes Necrotic Quality: Adherent Slough Tendon Exposed: No Muscle Exposed: No Joint Exposed: No Bone Exposed: No Periwound Skin Texture Texture Color No Abnormalities Noted: Yes No Abnormalities Noted: No Erythema: No Salmi, Rayner [name] ([id number]) [id number] Page 10 of 15 Erythema: No Moisture No Abnormalities Noted: No Temperature / Pain Dry / Scaly: Yes Temperature: No Abnormality Treatment Notes Wound #67 (Lower Leg) Wound Laterality: Left, Lateral Cleanser Soap and Water Discharge Instruction: May shower and wash wound with dial antibacterial soap and water prior to dressing change. Wound Cleanser Discharge Instruction: Cleanse the wound with wound cleanser prior to applying [name] clean dressing using gauze sponges, not tissue or cotton balls. Peri-Wound Care Sween Lotion (Moisturizing  lotion) Discharge Instruction: Apply Aquaphor moisturizing lotion as directed Topical Mupirocin Ointment Discharge Instruction: Apply Mupirocin (Bactroban) as instructed Primary  Dressing Maxorb Extra Ag+ Alginate Dressing, 2x2 (in/in) Discharge Instruction: Apply to wound bed as instructed Secondary Dressing Woven Gauze Sponge, Non-Sterile 4x4 in Discharge Instruction: Apply over primary dressing as directed. Secured With [employer], 4.5x3.1 (in/yd) Discharge Instruction: Secure with Kerlix as directed. 49M Medipore H Soft Cloth Surgical T ape, 4 x 10 (in/yd) Discharge Instruction: Secure with tape as directed. Compression Wrap Compression Stockings Add-Ons Electronic Signature(s) Signed: 03/08/2023 3:41:18 PM By: Karie Schwalbe RN Entered By: Karie Schwalbe on 03/08/2023 08:05:28 -------------------------------------------------------------------------------- Wound Assessment Details Patient Name: Date of Service: Sans, [name] LEX [name]. 03/08/2023 7:30 [name] M Medical Record Number: [id number] Patient Account Number: [id number] Date of Birth/Sex: Treating RN: 10/10/1986 (37 y.o. Dianna Limbo Primary Care Tahiry Spicer: [name], [name] Other Clinician: Referring Masiel Gentzler: Treating Morgan Keinath/Extender: Benjamine Mola, [name]: 374 Wound Status Wound Number: 74 Primary Etiology: Pressure Ulcer Wound Location: Left, Posterior Upper Leg Wound Status: Open Wounding Event: Pressure Injury Comorbid History: Sleep Apnea, Hypertension, Paraplegia Date Acquired: 01/10/2023 Weeks Of Treatment: 7 Clustered Wound: No Photos Nicholls, Usbaldo [name] ([id number]) [id number] Page 11 of 15 Wound Measurements Length: (cm) 9.5 Width: (cm) 4.5 Depth: (cm) 0.1 Area: (cm) 33.576 Volume: (cm) 3.358 % Reduction in Area: 55% % Reduction in Volume: 55% Epithelialization: Small (1-33%) Tunneling: No Undermining: No Wound Description Classification: Category/Stage III Wound Margin: Flat and Intact Exudate Amount: Large Exudate Type: Serosanguineous Exudate Color: red, brown Foul Odor After Cleansing: Yes Due to Product Use:  No Slough/Fibrino Yes Wound Bed Granulation Amount: Large (67-100%) Exposed Structure Granulation Quality: Red, Friable Fascia Exposed: No Necrotic Amount: Small (1-33%) Fat Layer (Subcutaneous Tissue) Exposed: Yes Necrotic Quality: Adherent Slough Tendon Exposed: No Muscle Exposed: No Joint Exposed: No Bone Exposed: No Periwound Skin Texture Texture Color No Abnormalities Noted: No No Abnormalities Noted: Yes Scarring: Yes Temperature / Pain Temperature: No Abnormality Moisture No Abnormalities Noted: Yes Treatment Notes Wound #74 (Upper Leg) Wound Laterality: Left, Posterior Cleanser Vashe 5.8 (oz) Discharge Instruction: Cleanse the wound with Vashe prior to applying [name] clean dressing using gauze sponges , let sit on wound for 10 minutes Peri-Wound Care Zinc Oxide Ointment 30g tube Discharge Instruction: Apply Zinc Oxide to periwound with each dressing change as needed fpr moisture Topical Keystone antibiotic compound Primary Dressing Maxorb Extra Ag+ Alginate Dressing, 4x4.75 (in/in) Discharge Instruction: Apply to wound bed as instructed Secondary Dressing Zetuvit Plus Silicone Border Sacrum Dressing, Sm, 7x7 (in/in) Discharge Instruction: Apply silicone border over primary dressing as directed. Secured With 49M Medipore H Soft Cloth Surgical T ape, 4 x 10 (in/yd) Discharge Instruction: Secure with tape as directed. Compression Wrap Compression Stockings Toepfer, Hridaan [name] ([id number]) [id number] Page 12 of 15 Add-Ons Electronic Signature(s) Signed: 03/08/2023 3:41:18 PM By: Karie Schwalbe RN Entered By: Karie Schwalbe on 03/08/2023 08:06:19 -------------------------------------------------------------------------------- Wound Assessment Details Patient Name: Date of Service: [place], [name] LEX [name]. 03/08/2023 7:30 [name] M Medical Record Number: [id number] Patient Account Number: [id number] Date of Birth/Sex: Treating RN: [birth year] (37 y.o. Damaris Schooner Primary Care Nainoa Woldt: [name], [name] Other Clinician: Referring Jerrie Schussler: Treating Lue [place]/Extender: Benjamine Mola, [name]: 374 Wound Status Wound Number: 75 Primary Etiology: Venous Leg Ulcer Wound Location: Right, Distal, Anterior Lower Leg Wound Status: Healed - Epithelialized Wounding Event: Gradually Appeared Comorbid History: Sleep Apnea, Hypertension, Paraplegia Date Acquired: 02/01/2023 Weeks Of Treatment: 5 Clustered Wound: No Photos Wound Measurements  Length: (cm) Width: (cm) Depth: (cm) Area: (cm) Volume: (cm) 0 % Reduction in Area: 100% 0 % Reduction in Volume: 100% 0 Epithelialization: Small (1-33%) 0 Tunneling: No 0 Undermining: No Wound Description Classification: Full Thickness Without Exposed Support Structures Wound Margin: Distinct, outline attached Exudate Amount: Medium Exudate Type: Serosanguineous Exudate Color: red, brown Foul Odor After Cleansing: No Slough/Fibrino Yes Wound Bed Granulation Amount: Medium (34-66%) Exposed Structure Granulation Quality: Red Fat Layer (Subcutaneous Tissue) Exposed: Yes Necrotic Amount: Medium (34-66%) Necrotic Quality: Eschar Periwound Skin Texture Texture Color No Abnormalities Noted: Yes No Abnormalities Noted: No Erythema: Yes Moisture Erythema Measurement: Measured No Abnormalities Noted: No 7 cm Dry / Scaly: Yes Temperature / Pain Temperature: No Abnormality Treatment Notes Wound #75 (Lower Leg) Wound Laterality: Right, Anterior, Distal Bise, Dalen [name] ([id number]) [id number] Page 13 of 15 Cleanser Peri-Wound Care Topical Primary Dressing Secondary Dressing Secured With Compression Wrap Compression Stockings Add-Ons Electronic Signature(s) Signed: 03/08/2023 4:58:18 PM By: Zenaida Deed RN, BSN Entered By: Zenaida Deed on 03/08/2023  08:23:49 -------------------------------------------------------------------------------- Wound Assessment Details Patient Name: Date of Service: Bolen, [name] LEX [name]. 03/08/2023 7:30 [name] M Medical Record Number: [id number] Patient Account Number: [id number] Date of Birth/Sex: Treating RN: 1986-01-15 (37 y.o. Dianna Limbo Primary Care Olly Shiner: [name], [name] Other Clinician: Referring Guy Toney: Treating Khalia Gong/Extender: Benjamine Mola, [name]: 374 Wound Status Wound Number: 76 Primary Etiology: Venous Leg Ulcer Wound Location: Right, Lateral Lower Leg Wound Status: Open Wounding Event: Pressure Injury Comorbid History: Sleep Apnea, Hypertension, Paraplegia Date Acquired: 02/08/2023 Weeks Of Treatment: 3 Clustered Wound: No Photos Wound Measurements Length: (cm) 0.3 Width: (cm) 0.2 Depth: (cm) 0.1 Area: (cm) 0.047 Volume: (cm) 0.005 % Reduction in Area: 90.5% % Reduction in Volume: 89.8% Epithelialization: Small (1-33%) Tunneling: No Undermining: No Wound Description Classification: Full Thickness Without Exposed Suppor Wound Margin: Flat and Intact Exudate Amount: Medium Exudate Type: Serosanguineous Exudate Color: red, brown t Structures Foul Odor After Cleansing: No Slough/Fibrino No Wound Bed Granulation Amount: Small (1-33%) Exposed Structure Granulation Quality: Pink Fascia Exposed: No Necrotic Amount: Large (67-100%) Fat Layer (Subcutaneous Tissue) Exposed: Yes Necrotic Quality: Eschar Tendon Exposed: No Muscle Exposed: No Valera, Edris [name] ([id number]) [id number].pdf Page 14 of 15 Joint Exposed: No Bone Exposed: No Periwound Skin Texture Texture Color No Abnormalities Noted: Yes No Abnormalities Noted: Yes Moisture No Abnormalities Noted: No Dry / Scaly: Yes Treatment Notes Wound #76 (Lower Leg) Wound Laterality: Right, Lateral Cleanser Soap and Water Discharge Instruction: May shower and wash wound  with dial antibacterial soap and water prior to dressing change. Wound Cleanser Discharge Instruction: Cleanse the wound with wound cleanser prior to applying [name] clean dressing using gauze sponges, not tissue or cotton balls. Peri-Wound Care Sween Lotion (Moisturizing lotion) Discharge Instruction: Apply Aquaphor moisturizing lotion as directed Topical Mupirocin Ointment Discharge Instruction: Apply Mupirocin (Bactroban) as instructed Primary Dressing Maxorb Extra Ag+ Alginate Dressing, 2x2 (in/in) Discharge Instruction: Apply to wound bed as instructed Secondary Dressing Woven Gauze Sponge, Non-Sterile 4x4 in Discharge Instruction: Apply over primary dressing as directed. Secured With [employer], 4.5x3.1 (in/yd) Discharge Instruction: Secure with Kerlix as directed. 61M Medipore H Soft Cloth Surgical T ape, 4 x 10 (in/yd) Discharge Instruction: Secure with tape as directed. Compression Wrap Compression Stockings Add-Ons Electronic Signature(s) Signed: 03/08/2023 3:41:18 PM By: Karie Schwalbe RN Entered By: Karie Schwalbe on 03/08/2023 08:08:00 -------------------------------------------------------------------------------- Vitals Details Patient Name: Date of Service: [place], [name] LEX [name]. 03/08/2023 7:30 [name] M Medical Record Number: [id number] Patient Account Number: [id number] Date  of Birth/Sex: Treating RN: 18-May-1986 (36 y.o. Dianna Limbo Primary Care Zyhir Cappella: [name], [name] Other Clinician: Referring Kiran Carline: Treating Adelae Yodice/Extender: Benjamine Mola, [name]: 374 Vital Signs Time Taken: 07:40 Temperature (F): 97.9 Height (in): 70 Pulse (bpm): 109 Weight (lbs): 216 Respiratory Rate (breaths/min): 18 Body Mass Index (BMI): 31 Blood Pressure (mmHg): 124/68 Reference Range: 80 - 120 mg / dl Postle, Dameir [name] ([id number]) [id number].pdf Page 15 of 15 Electronic Signature(s) Signed: 03/08/2023 3:41:18 PM By: Karie Schwalbe RN Entered By: Karie Schwalbe on 03/08/2023 07:43:25

## 2023-03-09 NOTE — Progress Notes (Signed)
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 present but the warmth and swelling is improved. I am hopeful that the cellulitis part of this control. I wouldn't be surprised if there is an element of venous inflammation as well. 01/17/17. The erythema is present but better in the left leg. His left lateral leg wound still does not have [name] viable surface buttons certain parts of this long thin wound it appears like there has been improvement in dimensions. 01/20/17; the erythema still present but much better in the left leg. I'm thinking this is his usual degree of chronic venous inflammation. The wound on the left leg looks somewhat better. Is less surface slough 01/27/17; erythema is back to the chronic venous inflammation. The wound on the left leg is somewhat better. I am back to the point where I like to try an Apligraf once again 02/10/17; slight improvement in wound dimensions. Apligraf #2. He is completing his doxycycline 02/14/17; patient arrives today having completed doxycycline last Thursday. This was supposed to be [name] nurse visit however once again he hasn't tense erythema from the medial part of his wound extending over the lower leg. Also erythema in his foot this is roughly in the same distribution as last time. He has baseline chronic venous inflammation however this is [name] lot worse than the baseline I have learned to accept the on him  is baseline inflammation 02/24/17- patient is here for follow-up evaluation. He is tolerating compression therapy. His voicing no complaints or concerns he is here anticipating an Apligraf 03/03/17; he arrives today with an adherent necrotic surface. I don't think this is surface is going to be amenable for Apligraf's. The erythema around his wound and on the left dorsal foot has resolved he is off antibiotics 03/10/17; better-looking surface today. I don't think he can tolerate Apligraf's. He tells me he had [name] wound VAC after [name] skin graft years ago to this area and they had difficulty with [name] seal. The erythema continues to be stable around this some degree of chronic venous inflammation but he also has recurrent cellulitis. We have been using Iodoflex 03/17/17; continued improvement in the surface and may be small changes in dimensions. Using Iodoflex which seems the only thing that will control his surface 03/24/17- He is here for follow up evaluation of his LLE lateral ulceration and ulcer to right dorsal foot/toe space. He is voicing no complaints or concerns, He is tolerating compression wrap. 03/31/17 arrives today with [name] much healthier looking wound on the left lower extremity.  We have been using Iodoflex for [name] prolonged period of time which has for the first time prepared and adequate looking wound bed although we have not had much in the way of wound dimension improvement. He also has [name] small wound between the first and second toe on the right 04/07/17; arrives today with [name] healthy-looking wound bed and at least the top 50% of this wound appears to be now her. No debridement was required I have changed him to [facility] last week after prolonged Iodoflex. He did not do well with Apligraf's. We've had [name] re-opening between the first and second toe on the right 04/14/17; arrives today with [name] healthier looking wound bed contractions and the top 50% of this wound and some on the lesser 50%. Wound bed  appears healthy. The area between the first and second toe on the right still remains problematic 04/21/17; continued very gradual improvement. Using [facility] 04/28/17; continued very gradual improvement in the left lateral leg venous insufficiency wound. His periwound erythema is very mild. We have been using Hydrofera Blue. Wound is making progress especially in the superior 50% 05/05/17; he continues to have very gradual improvement in the left lateral venous insufficiency wound. Both in terms with an length rings are improving. I debrided this every 2 weeks with #5 curet and we have been using Hydrofera Blue and again making good progress With regards to the wounds between his right first and second toe which I thought might of been tinea pedis he is not making as much progress very dry scaly Aleshire, Landry [name] ([id number]) [id number] Page 6 of 37 skin over the area. Also the area at the base of the left first and second toe in [name] similar condition 05/12/17; continued gradual improvement in the refractory left lateral venous insufficiency wound on the left. Dimension smaller. Surface still requiring debridement using Hydrofera Blue 05/19/17; continued gradual improvement in the refractory left lateral venous ulceration. Careful inspection of the wound bed underlying rumination suggested some degree of epithelialization over the surface no debridement indicated. Continue Hydrofera Blue difficult areas between his toes first and third on the left than first and second on the right. I'm going to change to silver alginate from silver collagen. Continue ketoconazole as I suspect underlying tinea pedis 05/26/17; left lateral leg venous insufficiency wound. We've been using Hydrofera Blue. I believe that there is expanding epithelialization over the surface of the wound albeit not coming from the wound circumference. This is [name] bit of an odd situation in which the epithelialization  seems to be coming from the surface of the wound rather than in the exact circumference. There is still small open areas mostly along the lateral margin of the wound. He has unchanged areas between the left first and second and the right first second toes which I been treating for tenia pedis 06/02/17; left lateral leg venous insufficiency wound. We have been using Hydrofera Blue. Somewhat smaller from the wound circumference. The surface of the wound remains [name] bit on it almost epithelialized sedation in appearance. I use an open curette today debridement in the surface of all of this especially the edges Small open wounds remaining on the dorsal right first and second toe interspace and the plantar left first second toe and her face on the left 06/09/17; wound on the left lateral leg continues to be smaller but very gradual and very dry surface using Hydrofera Blue 06/16/17 requires weekly debridements now on the left lateral leg although this continues  to contract. I changed to silver collagen last week because of dryness of the wound bed. Using Iodoflex to the areas on his first and second toes/web space bilaterally 06/24/17; patient with history of paraplegia also chronic venous insufficiency with lymphedema. Has [name] very difficult wound on the left lateral leg. This has been gradually reducing in terms of with but comes in with [name] very dry adherent surface. High switch to silver collagen [name] week or so ago with hydrogel to keep the area moist. This is been refractory to multiple dressing attempts. He also has areas in his first and second toes bilaterally in the anterior and posterior web space. I had been using Iodoflex here after [name] prolonged course of silver alginate with ketoconazole was ineffective [question tinea pedis] 07/14/17; patient arrives today with [name] very difficult adherent material over his left lateral lower leg wound. He also has surrounding erythema and poorly controlled edema. He was  switched his Santyl last visit which the nurses are applying once during his doctor visit and once on [name] nurse visit. He was also reduced to 2 layer compression I'm not exactly sure of the issue here. 07/21/17; better surface today after 1 week of Iodoflex. Significant cellulitis that we treated last week also better. [Doxycycline] 07/28/17 better surface today with now 2 weeks of Iodoflex. Significant cellulitis treated with doxycycline. He has now completed the doxycycline and he is back to his usual degree of chronic venous inflammation/stasis dermatitis. He reminds me he has had ablations surgery here 08/04/17; continued improvement with Iodoflex to the left lateral leg wound in terms of the surface of the wound although the dimensions are better. 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 Problematic areas on the plantar aspect of the first second toe web space on the left and the dorsal aspect of the first second toe web space on the right. At one point I felt these were probably related to chronic fungal infections in treated him aggressively for this although we have not made any improvement here. 08/11/17; left lateral leg. Surface continues to improve with the Iodoflex although we are not seeing much improvement in overall wound dimensions. Areas on his plantar left foot and right foot show no improvement. In fact the right foot looks somewhat worse 08/18/17; left lateral leg. We changed to [facility] Blue last week after [name] prolonged course of Iodoflex which helps get the surface better. It appears that the wound with is improved. Continue with difficult areas on the left dorsal first second and plantar first second on the right 09/01/17; patient arrives in clinic today having had [name] temperature of 103 yesterday. He was seen in the ER and [name]. The patient was concerned he could have cellulitis again in the right leg however  they diagnosed him with [name] UTI and he is now on Keflex. He has [name] history of cellulitis which is been recurrent and difficult but this is been in the left leg, in the past 5 use doxycycline. He does in and out catheterizations at home which are risk factors for UTI 09/08/17; patient will be completing his Keflex this weekend. The erythema on the left leg is considerably better. He has [name] new wound today on the medial part of the right leg small superficial almost looks like [name] skin tear. He has worsening of the area on the right dorsal first and second toe. His major area on the left lateral leg is better.  Using Hydrofera Blue on all areas 09/15/17; gradual reduction in width on the long wound in the left lateral leg. No debridement required. He also has wounds on the plantar aspect of his left first second toe web space and on the dorsal aspect of the right first second toe web space. 09/22/17; there continues to be very gradual improvements in the dimensions of the left lateral leg wound. He hasn't round erythematous spot with might be pressure on his wheelchair. There is no evidence obviously of infection no purulence no warmth He has [name] dry scaled area on the plantar aspect of the left first second toe Improved area on the dorsal right first second toe. 09/29/17; left lateral leg wound continues to improve in dimensions mostly with an is still [name] fairly long but increasingly narrow wound. He has [name] dry scaled area on the plantar aspect of his left first second toe web space Increasingly concerning area on the dorsal right first second toe. In fact I am concerned today about possible cellulitis around this wound. The areas extending up his second toe and although there is deformities here almost appears to abut on the nailbed. 10/06/17; left lateral leg wound continues to make very gradual progress. Tissue culture I did from the right first second toe dorsal foot last time grew MRSA and enterococcus which  was vancomycin sensitive. This was not sensitive to clindamycin or doxycycline. He is allergic to Zyvox and sulfa we have therefore arrange for him to have dalvance infusion tomorrow. He is had this in the past and tolerated it well 10/20/17; left lateral leg wound continues to make decent progress. This is certainly reduced in terms of with there is advancing epithelialization.The cellulitis in the right foot looks better although he still has [name] deep wound in the dorsal aspect of the first second toe web space. Plantar left first toe web space on the left I think is making some progress 10/27/17; left lateral leg wound continues to make decent progress. Advancing epithelialization.using Hydrofera Blue The right first second toe web space wound is better-looking using silver alginate Improvement in the left plantar first second toe web space. Again using silver alginate 11/03/17 left lateral leg wound continues to make decent progress albeit slowly. Using [facility] The right per second toe web space continues to be [name] very problematic looking punched out wound. I obtained [name] piece of tissue for deep culture I did extensively treated this for fungus. It is difficult to imagine that this is [name] pressure area as the patient states other than going outside he doesn't really wear shoes at home The left plantar first second toe web space looked fairly senescent. Necrotic edges. This required debridement change to [facility] Blue to all wound areas 11/10/17; left lateral leg wound continues to contract. Using Hydrofera Blue On the right dorsal first second toe web space dorsally. Culture I did of this area last week grew MRSA there is not an easy oral option in this patient was multiple antibiotic allergies or intolerances. This was only [name] rare culture isolate I'm therefore going to use Bactroban under silver alginate On the left plantar first second toe web space. Debridement is required here. This is also  unchanged 11/17/17; left lateral leg wound continues to contract using Hydrofera Blue this is no longer the major issue. The major concern here is the right first second toe web space. He now has an open area going from dorsally to the plantar aspect. There is now wound on  the inner lateral part of the first toe. Not [name] very viable surface on this. There is erythema spreading medially into the forefoot. No major change in the left first second toe plantar wound 11/24/17; left lateral leg wound continues to contract using Hydrofera Blue. Nice improvement today The right first second toe web space all of this looks [name] lot less angry than last week. I have given him clindamycin and topical Bactroban for MRSA and terbinafine for the possibility of underlining tinea pedis that I could not control with ketoconazole. Looks somewhat better The area on the plantar left first second toe web space is weeping with dried debris around the wound 12/01/17; left lateral leg wound continues to contract he Hydrofera Blue. It is becoming thinner in terms of with nevertheless it is making good improvement. The right first second toe web space looks less angry but still [name] large necrotic-looking wounds starting on the plantar aspect of the right foot extending between the toes and now extensively on the base of the right second toe. I gave him clindamycin and topical Bactroban for MRSA anterior benefiting for the possibility of underlying tinea pedis. Not looking better today The area on the left first/second toe looks better. Debrided of necrotic debris 12/05/17* the patient was worked in urgently today because over the weekend he found blood on his incontinence bad when he woke up. He was found to have an ulcer by his wife who does most of his wound care. He came in today for [place] to look at this. He has not had [name] history of wounds in his buttocks in spite of his paraplegia. 12/08/17; seen in follow-up today at his usual  appointment. He was seen earlier this week and found to have [name] new wound on his buttock. We also follow him for wounds on the left lateral leg, left first second toe web space and right first second toe web space 12/15/17; we have been using Hydrofera Blue to the left lateral leg which has improved. The right first second toe web space has also improved. Left first second toe web space plantar aspect looks stable. The left buttock has worsened using Santyl. Apparently the buttock has drainage 12/22/17; we have been using Hydrofera Blue to the left lateral leg which continues to improve now 2 small wounds separated by normal skin. Hathaway, ZAILEN ALBARRAN ([id number]) [id number] Page 7 of 37 He tells [place] he had [name] fever up to 100 yesterday he is prone to UTIs but has not noted anything different. He does in and out catheterizations. The area between the first and second toes today does not look good necrotic surface covered with what looks to be purulent drainage and erythema extending into the third toe. I had gotten this to something that I thought look better last time however it is not look good today. He also has [name] necrotic surface over the buttock wound which is expanded. I thought there might be infection under here so I removed [name] lot of the surface with [name] #5 curet though nothing look like it really needed culturing. He is been using Santyl to this area 12/27/17; his original wound on the left lateral leg continues to improve using Hydrofera Blue. I gave him samples of Baxdella although he was unable to take them out of fear for an allergic reaction ["lump in his throat"].the culture I did of the purulent drainage from his second toe last week showed both enterococcus and [name] set Enterobacter I was also concerned  about the erythema on the bottom of his foot although paradoxically although this looks somewhat better today. 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 We've been using silver alginate here. They came up today that he sleeps in [name] recliner, I'm not sure why but I asked him to stop this 01/03/18; his original wound we've been using Hydrofera Blue is now separated into 2 areas. Ulcer on his left buttock is better he is off the recliner and sleeping in bed Finally both wound areas between his first and second toes also looks some better 01/10/18; his original wound on the left lateral leg is now separated into 2 wounds we've been using Hydrofera Blue Ulcer on his left buttock has some drainage. There is [name] small probing site going into muscle layer superiorly.using silver alginate -He arrives today with [name] deep tissue injury on the left heel The wound on the dorsal aspect of his first second toe on the left looks [name] lot betterusing silver alginate ketoconazole The area on the first second toe web space on the right also looks [name] lot bette 01/17/18; his original wound on the left lateral leg continues to progress using Hydrofera Blue Ulcer on his left buttock also is smaller surface healthier except for [name] small probing site going into the muscle layer superiorly. 2.4 cm of tunneling in this area DTI on his left heel we have only been offloading. Looks better than last week no threatened open no evidence of infection the wound on the dorsal aspect of the first second toe on the left continues to look like it's regressing we have only been using silver alginate and terbinafine orally The area in the first second toe web space on the right also looks to be [name] lot better using silver alginate and terbinafine I think this was prompted by tinea pedis 01/31/18; the patient was hospitalized in [place] last week apparently for [name] complicated UTI. He was discharged on cefepime he does in and out catheterizations. In the hospital he was discovered M I don't mild elevation of AST and ALT and the  terbinafine was stopped.predictably the pressure ulcer on s his buttock looks betterusing silver alginate. The area on the left lateral leg also is better using Hydrofera Blue. The area between the first and second toes on the left better. First and second toes on the right still substantial but better. Finally the DTI on the left heel has held together and looks like it's resolving 02/07/18-he is here in follow-up evaluation for multiple ulcerations. He has new injury to the lateral aspect of the last issue [name] pressure ulcer, he states this is from adhesive removal trauma. He states he has tried multiple adhesive products with no success. All other ulcers appear stable. The left heel DTI is resolving. We will continue with same treatment plan and follow-up next week. 02/14/18; follow-up for multiple areas. He has [name] new area last week on the lateral aspect of his pressure ulcer more over the posterior trochanter. The original pressure ulcer looks quite stable has healthy granulation. 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 Almost fully epithelialized on the original superior area using Hydrofera Blue DTI on the left heel has peeled off this week to reveal [name] small superficial wound under denuded skin and subcutaneous tissue Both areas between the first  and second toes look better including nothing open on the left 02/21/18; The patient's wounds on his left ischial tuberosity and posterior left greater trochanter actually looked better. He has [name] large area of irritation around the area which I think is contact dermatitis. I am doubtful that this is fungal His original wound on the left lateral calf continues to improve we have been using Hydrofera Blue There is no open area in the left first second toe web space although there is [name] lot of thick callus The DTI on the left heel required debridement  today of necrotic surface eschar and subcutaneous tissue using silver alginate Finally the area on the right first second toe webspace continues to contract using silver alginate and ketoconazole 02/28/18 Left ischial tuberosity wounds look better using silver alginate. Original wound on the left calf only has one small open area left using Hydrofera Blue DTI on the left heel required debridement mostly removing skin from around this wound surface. Using silver alginate The areas on the right first/second toe web space using silver alginate and ketoconazole 03/08/18 on evaluation today patient appears to be doing decently well as best I can tell in regard to his wounds. This is the first time that I have seen him as he generally is followed by Dr. Leanord Hawking. With that being said none of his wounds appear to be infected he does have an area where there is some skin covering what appears to be [name] new wound on the left dorsal surface of his great toe. This is right at the nail bed. With that being said I do believe that debrided away some of the excess skin can be of benefit in this regard. Otherwise he has been tolerating the dressing changes without complication. 03/14/18; patient arrives today with the multiplicity of wounds that we are following. He has not been systemically unwell Original wound on the left lateral calf now only has 2 small open areas we've been using Hydrofera Blue which should continue The deep tissue injury on the left heel requires debridement today. We've been using silver alginate The left first second toe and the right first second toe are both are reminiscence what I think was tinea pedis. Apparently some of the callus Surface between the toes was removed last week when it started draining. Purulent drainage coming from the wound on the ischial tuberosity on the left. 03/21/18-He is here in follow-up evaluation for multiple wounds. 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 He tolerated debridement. The only change to last week's recommendations is to discontinue antifungal cream between toes. He will follow-up next week 03/28/18; following up for multiple wounds;Concern this week is streaking redness and swelling in the right foot. He is going to need antibiotics for this. 03/31/18; follow-up for right foot cellulitis. Streaking redness and swelling in the right foot on 03/28/18. 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 I put him on clindamycin 300 mg every 6 and brought him in for [name] quick check. He has an open wound between his first and second toes on the right foot as [name] potential source. 04/04/18; Right foot cellulitis is resolving he is completing clindamycin. This is truly good news Left lateral calf wound which is initial wound only has one small open area inferiorly this is close to healing out. He has compression stockings. We will use Hydrofera Blue right down to the epithelialization of this Nonviable surface on  the left heel which was initially pressure with [name] DTI. We've been using Hydrofera Blue. I'm going to switch this back to silver alginate Left first second toe/tinea pedis this looks better using silver alginate Right first second toe tinea pedis using silver alginate Large pressure ulcers on theLeft ischial tuberosity. Small wound here Looks better. I am uncertain about the surface over the large wound. Using silver alginate 04/11/18; Cellulitis in the right foot is resolved Left lateral calf wound which was his original wounds still has 2 tiny open areas remaining this is just about closed Nonviable surface on the left heel is better but still requires debridement Left first second toe/tinea pedis still open using silver alginate Right first second toe wound tinea pedis I asked him to go back to using ketoconazole and silver alginate Large pressure ulcers  on the left ischial tuberosity this shear injury here is resolved. Wound is smaller. No evidence of infection using silver alginate Gorniak, Vasilios [name] ([id number]) [phone].pdf Page 8 of 37 04/18/18; Patient arrives with an intense area of cellulitis in the right mid lower calf extending into the right heel area. Bright red and warm. Smaller area on the left anterior leg. He has [name] significant history of MRSA. He will definitely need antibioticsdoxycycline He now has 2 open areas on the left ischial tuberosity the original large wound and now [name] satellite area which I think was above his initial satellite areas. Not [name] wonderful surface on this satellite area surrounding erythema which looks like pressure related. His left lateral calf wound again his original wound is just about closed Left heel pressure injury still requiring debridement Left first second toe looks [name] lot better using silver alginate Right first second toe also using silver alginate and ketoconazole cream also looks better 04/20/18; the patient was worked in early today out of concerns with his cellulitis on the right leg. I had started him on doxycycline. This was 2 days ago. His wife was concerned about the swelling in the area. Also concerned about the left buttock. He has not been systemically unwell no fever chills. No nausea vomiting or diarrhea 04/25/18; the patient's left buttock wound is continued to deteriorate he is using Hydrofera Blue. He is still completing clindamycin for the cellulitis on the right leg although all of this looks better. 05/02/18 Left buttock wound still with [name] lot of drainage and [name] very tightly adherent fibrinous necrotic surface. He has [name] deeper area superiorly The left lateral calf wound is still closed DTI wound on the left heel necrotic surface especially the circumference using Iodoflex Areas between his left first second toe and right first second toe both look better. Dorsally  and the right first second toe he had [name] necrotic surface although at smaller. In using silver alginate and ketoconazole. I did [name] culture last week which was [name] deep tissue culture of the reminiscence of the open wound on the right first second toe dorsally. This grew [name] few Acinetobacter and [name] few methicillin-resistant staph aureus. Nevertheless the area actually this week looked better. I didn't feel the need to specifically address this at least in terms of systemic antibiotics. 05/09/18; wounds are measuring larger more drainage per our intake. We are using Santyl covered with alginate on the large superficial buttock wounds, Iodosorb on the left heel, ketoconazole and silver alginate to the dorsal first and second toes bilaterally. 05/16/18; The area on his left buttock better in some aspects although the area superiorly over the ischial tuberosity  required an extensive debridement.using Santyl Left heel appears stable. Using Iodoflex The areas between his first and second toes are not bad however there is spreading erythema up the dorsal aspect of his left foot this looks like cellulitis again. He is insensate the erythema is really very brilliant.o Erysipelas He went to see an allergist days ago because he was itching part of this he had lab work done. This showed [name] white count of 15.1 with 70% neutrophils. Hemoglobin of 11.4 and [name] platelet count of 659,000. Last white count we had in Epic was [name] 2-1/2 years ago which was 25.9 but he was ill at the time. He was able to show me some lab work that was done by his primary physician the pattern is about the same. 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 But prompted me to go ahead and do x-rays of both feet and the pelvis rule out osteomyelitis. He also had [name] comprehensive metabolic panel this was reasonably normal his albumin was 3.7 liver function tests BUN/creatinine all normal 05/23/18; x-rays of both  his feet from last week were negative for underlying pulmonary abnormality. The x-ray of his pelvis however showed mild irregularity in the left ischial which may represent some early osteomyelitis. The wound in the left ischial continues to get deeper clearly now exposed muscle. Each week necrotic surface material over this area. Whereas the rest of the wounds do not look so bad. The left ischial wound we have been using Santyl and calcium alginate T the left heel surface necrotic debris using Iodoflex o The left lateral leg is still healed Areas on the left dorsal foot and the right dorsal foot are about the same. There is some inflammation on the left which might represent contact dermatitis, fungal dermatitis I am doubtful cellulitis although this looks better than last week 05/30/18; CT scan done at Hospital did not show any osteomyelitis or abscess. Suggested the possibility of underlying cellulitis although I don't see [name] lot of evidence of this at the bedside The wound itself on the left buttock/upper thigh actually looks somewhat better. No debridement Left heel also looks better no debridement continue Iodoflex Both dorsal first second toe spaces appear better using Lotrisone. Left still required debridement 06/06/18; Intake reported some purulent looking drainage from the left gluteal wound. Using Santyl and calcium alginate Left heel looks better although still [name] nonviable surface requiring debridement The left dorsal foot first/second webspace actually expanding and somewhat deeper. I may consider doing [name] shave biopsy of this area Right dorsal foot first/second webspace appears stable to improved. Using Lotrisone and silver alginate to both these areas 06/13/18 Left gluteal surface looks better. Now separated in the 2 wounds. No debridement required. Still drainage. We'll continue silver alginate Left heel continues to look better with Iodoflex continue this for at least another week Of  his dorsal foot wounds the area on the left still has some depth although it looks better than last week. We've been using Lotrisone and silver alginate 06/20/18 Left gluteal continues to look better healthy tissue Left heel continues to look better healthy granulation wound is smaller. He is using Iodoflex and his long as this continues continue the Iodoflex Dorsal right foot looks better unfortunately dorsal left foot does not. There is swelling and erythema of his forefoot. He had minor trauma to this several days ago but doesn't think this was enough to have caused any tissue injury. Foot  looks like cellulitis, we have had this problem before 06/27/18 on evaluation today patient appears to be doing [name] little worse in regard to his foot ulcer. 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 Both of which would really be his only options for treating this infection. In the past he has been given and effusion of Orbactiv. This is done very well for him in the past again it's one time dosing IV antibiotic therapy. Subsequently I do believe this is something we're gonna need to see about doing at this point in time. Currently his other wounds seem to be doing somewhat better in my pinion I'm pretty happy in that regard. 07/03/18 on evaluation today patient's wounds actually appear to be doing fairly well. He has been tolerating the dressing changes without complication. All in all he seems to be showing signs of improvement. In regard to the antibiotics he has been dealing with infectious disease since I saw him last week as far as getting this scheduled. In the end [facility]'s [facility] this coming Friday. In the meantime he has been continuing to perform the dressing changes in such as previous. There does not appear to  be any evidence of infection worsengin at this time. 07/10/18; Since I last saw this man 2 weeks ago things have actually improved. IV antibiotics of resulted in less forefoot erythema although there is still some present. He is not systemically unwell Left buttock wounds 2 now have no depth there is increased epithelialization Using silver alginate Left heel still requires debridement using Iodoflex Left dorsal foot still with [name] sizable wound about the size of [name] border but healthy granulation Right dorsal foot still with [name] slitlike area using silver alginate 07/18/18; the patient's cellulitis in the left foot is improved in fact I think it is on its way to resolving. Left buttock wounds 2 both look better although the larger one has hypertension granulation we've been using silver alginate Left heel has some thick circumferential redundant skin over the wound edge which will need to be removed today we've been using Iodoflex Left dorsal foot is still [name] sizable wound required debridement using silver alginate The right dorsal foot is just about closed only [name] small open area remains here 07/25/18; left foot cellulitis is resolved Left buttock wounds 2 both look better. Hyper-granulation on the major area Left heel as some debris over the surface but otherwise looks [name] healthier wound. Using silver collagen Right dorsal foot is just about closed 07/31/18; arrives with our intake nurse worried about purulent drainage from the buttock. We had hyper-granulation here last week Cates, Ercole [name] ([id number]) [id number] Page 9 of 37 His buttock wounds 2 continue to look better Left heel some debris over the surface but measuring smaller. Right dorsal foot unfortunately has openings between the toes Left foot superficial wound looks less aggravated. 08/07/18 Buttock wounds continue to look better although some of her granulation and the larger medial wound. silver alginate Left heel  continues to look [name] lot better.silver collagen Left foot superficial wound looks less stable. Requires debridement. He has [name] new wound superficial area on the foot on the lateral dorsal foot. Right foot looks better using silver alginate without Lotrisone 08/14/2018; patient was in the ER last week diagnosed with [name] UTI. He  is now on Cefpodoxime and Macrodantin. Buttock wounds continued to be smaller. Using silver alginate Left heel continues to look better using silver collagen Left foot superficial wound looks as though it is improving Right dorsal foot area is just about healed. 08/21/2018; patient is completed his antibiotics for his UTI. He has 2 open areas on the buttocks. There is still not closed although the surface looks satisfactory. Using silver alginate Left heel continues to improve using silver collagen The bilateral dorsal foot areas which are at the base of his first and second toes/possible tinea pedis are actually stable on the left but worse on the right. The area on the left required debridement of necrotic surface. After debridement I obtained [name] specimen for PCR culture. The right dorsal foot which is been just about healed last week is now reopened 08/28/2018; culture done on the left dorsal foot showed coag negative staph both staph epidermidis and Lugdunensis. I think this is worthwhile initiating systemic treatment. I will use doxycycline given his long list of allergies. The area on the left heel slightly improved but still requiring debridement. The large wound on the buttock is just about closed whereas the smaller one is larger. Using silver alginate in this area 09/04/2018; patient is completing his doxycycline for the left foot although this continues to be [name] very difficult wound area with very adherent necrotic debris. We are using silver alginate to all his wounds right foot left foot and the small wounds on his buttock, silver collagen on the left heel. 09/11/2018;  once again this patient has intense erythema and swelling of the left forefoot. Lesser degrees of erythema in the right foot. He has [name] long list of allergies and intolerances. I will reinstitute doxycycline. 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 Using silver alginate Left heel also looks better using silver collagen Unfortunately both the areas on his feet look worse. The area on the left first second webspace is now gone through to the plantar part of his foot. The area on the left foot anteriorly is irritated with erythema and swelling in the forefoot. 09/25/2018 His wound on the left plantar heel looks better. Using silver collagen The area on the left buttock 2 small remnant areas. One is closed one is still open. Using silver alginate The areas between both his first and second toes look worse. This in spite of long-standing antifungal therapy with ketoconazole and silver alginate which should have antifungal activity He has small areas around his original wound on the left calf one is on the bottom of the original scar tissue and one superiorly both of these are small and superficial but again given wound history in this site this is worrisome 10/02/2018 Left plantar heel continues to gradually contract using silver collagen Left buttock wound is unchanged using silver alginate The areas on his dorsal feet between his first and second toes bilaterally look about the same. I prescribed clindamycin ointment to see if we can address chronic staph colonization and also the underlying possibility of erythrasma The left lateral lower extremity wound is actually on the lateral part of his ankle. Small open area here. We have been using silver alginate 10/09/2018; Left plantar heel continues to look healthy and contract. No debridement is required Left buttock slightly smaller with [name] tape injury wound just below which was new this week Dorsal  feet somewhat improved I have been using clindamycin Left lateral looks  lower extremity the actual open area looks worse although [name] lot of this is epithelialized. I am going to change to silver collagen today He has [name] lot more swelling in the right leg although this is not pitting not red and not particularly warm there is [name] lot of spasm in the right leg usually indicative of people with paralysis of some underlying discomfort. We have reviewed his vascular status from 2017 he had [name] left greater saphenous vein ablation. I wonder about referring him back to vascular surgery if the area on the left leg continues to deteriorate. 10/16/2018 in today for follow-up and management of multiple lower extremity ulcers. His left Buttock wound is much lower smaller and almost closed completely. The wound to the left ankle has began to reopen with Epithelialization and some adherent slough. He has multiple new areas to the left foot and leg. The left dorsal foot without much improvement. Wound present between left great webspace and 2nd toe. Erythema and edema present right leg. Right LE ultrasound obtained on 10/10/18 was negative for DVT . 10/23/2018; Left buttock is closed over. Still dry macerated skin but there is no open wound. I suspect this is chronic pressure/moisture Left lateral calf is quite [name] bit worse than when I saw this last. There is clearly drainage here he has macerated skin into the left plantar heel. We will change the primary dressing to alginate Left dorsal foot has some improvement in overall wound area. Still using clindamycin and silver alginate Right dorsal foot about the same as the left using clindamycin and silver alginate The erythema in the right leg has resolved. He is DVT rule out was negative Left heel pressure area required debridement although the wound is smaller and the surface is health 10/26/2018 The patient came back in for his nurse check today predominantly because of  the drainage coming out of the left lateral leg with [name] recent reopening of his original wound on the left lateral calf. He comes in today with [name] large amount of surrounding erythema around the wound extending from the calf into the ankle and even in the area on the dorsal foot. He is not systemically unwell. He is not febrile. Nevertheless this looks like cellulitis. We have been using silver alginate to the area. I changed him to [name] regular visit and I am going to prescribe him doxycycline. The rationale here is [name] long list of medication intolerances and [name] history of MRSA. I did not see anything that I thought would provide [name] valuable culture 10/30/2018 Follow-up from his appointment 4 days ago with really an extensive area of cellulitis in the left calf left lateral ankle and left dorsal foot. I put him on doxycycline. He has [name] long list of medication allergies which are true allergy reactions. 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 In any case he is [name] lot better today. The erythema especially in the anterior and lateral left calf is better. He still has left ankle erythema. He also is complaining about increasing edema in the right leg we have only been using Kerlix Coban and he has been doing the wraps at home. Finally he has [name] spotty rash on the medial part of his upper left calf which looks like folliculitis or perhaps wrap occlusion type injury. Small superficial macules not pustules 11/06/18 patient arrives today with again [name] considerable degree of erythema around the wound on the left lateral  calf extending into the dorsal ankle and dorsal foot. This is [name] lot worse than when I saw this last week. He is on doxycycline really with not [name] lot of improvement. He has not been systemically unwell Wounds on the; left heel actually looks improved. Original area on the left foot and proximity to the first and second toes looks about  the same. He has superficial areas on the dorsal foot, anterior calf and then the reopening of his original wound on the left lateral calf which looks about the same The only area he has on the right is the dorsal webspace first and second which is smaller. He has [name] large area of dry erythematous skin on the left buttock small open area here. 11/13/2018; the patient arrives in much better condition. The erythema around the wound on the left lateral calf is [name] lot better. Not sure whether this was the Pietsch, Vaishnav [name] ([id number]) [phone].pdf Page 10 of 37 clindamycin or the TCA and ketoconazole or just in the improvement in edema control [stasis dermatitis]. In any case this is [name] lot better. The area on the left heel is very small and just about resolved using silver collagen we have been using silver alginate to the areas on his dorsal feet 11/20/2018; his wounds include the left lateral calf, left heel, dorsal aspects of both feet just proximal to the first second webspace. He is stable to slightly improved. I did not think any changes to his dressings were going to be necessary 11/27/2018 he has [name] reopening on the left buttock which is surrounded by what looks like tinea or perhaps some other form of dermatitis. The area on the left dorsal foot has some erythema around it I have marked this area but I am not sure whether this is cellulitis or not. Left heel is not closed. Left calf the reopening is really slightly longer and probably worse 1/13; in general things look better and smaller except for the left dorsal foot. Area on the left heel is just about closed, left buttock looks better only [name] small wound remains in the skin looks better [using Lotrisone] 1/20; the area on the left heel only has [name] few remaining open areas here. Left lateral calf about the same in terms of size, left dorsal foot slightly larger right lateral foot still not closed. The area on the left buttock  has no open wound and the surrounding skin looks [name] lot better 1/27; the area on the left heel is closed. Left lateral calf better but still requiring extensive debridements. The area on his left buttock is closed. He still has the open areas on the left dorsal foot which is slightly smaller in the right foot which is slightly expanded. We have been using Iodoflex on these areas as well 2/3; left heel is closed. Left lateral calf still requiring debridement using Iodoflex there is no open area on his left buttock however he has dry scaly skin over [name] large area of this. Not really responding well to the Lotrisone. Finally the areas on his dorsal feet at the level of the first second webspace are slightly smaller on the right and about the same on the left. Both of these vigorously debrided with Anasept and gauze 2/10; left heel remains closed he has dry erythematous skin over the left buttock but there is no open wound here. Left lateral leg has come in and with. Still requiring debridement we have been using Iodoflex here. Finally the area  on the left dorsal foot and right dorsal foot are really about the same extremely dry callused fissured areas. He does not yet have [name] dermatology appointment 2/17; left heel remains closed. He has [name] new open area on the left buttock. The area on the left lateral calf is bigger longer and still covered in necrotic debris. No major change in his foot areas bilaterally. I am awaiting for [name] dermatologist to look on this. We have been using ketoconazole I do not know that this is been doing any good at all. 2/24; left heel remains closed. The left buttock wound that was new reopening last week looks better. The left lateral calf appears better also although still requires debridement. The major area on his foot is the left first second also requiring debridement. We have been putting Prisma on all wounds. I do not believe that the ketoconazole has done too much good for  his feet. He will use Lotrisone I am going to give him [name] 2-week course of terbinafine. We still do not have [name] dermatology appointment 3/2 left heel remains closed however there is skin over bone in this area I pointed this out to him today. The left buttock wound is epithelialized but still does not look completely stable. The area on the left leg required debridement were using silver collagen here. With regards to his feet we changed to Lotrisone last week and silver alginate. 3/9; left heel remains closed. Left buttock remains closed. The area on the right foot is essentially closed. The left foot remains unchanged. Slightly smaller on the left lateral calf. Using silver collagen to both of these areas 3/16-Left heel remains closed. Area on right foot is closed. Left lateral calf above the lateral malleolus open wound requiring debridement with easy bleeding. Left dorsal wound proximal to first toe also debrided. Left ischial area open new. Patient has been using Prisma with wrapping every 3 days. Dermatology appointment is apparently tomorrow.Patient has completed his terbinafine 2-week course with some apparent improvement according to him, there is still flaking and dry skin in his foot on the left 3/23; area on the right foot is reopened. The area on the left anterior foot is about the same still [name] very necrotic adherent surface. He still has the area on the left leg and reopening is on the left buttock. He apparently saw dermatology although I do not have [name] note. According to the patient who is usually fairly well informed they did not have any good ideas. Put him on oral terbinafine which she is been on before. 3/30; using silver collagen to all wounds. Apparently his dermatologist put him on doxycycline and rifampin presumably some culture grew staph. I do not have this result. He remains on terbinafine although I have used terbinafine on him before 4/6; patient has had [name] fairly  substantial reopening on the right foot between the first and second toes. He is finished his terbinafine and I believe is on doxycycline and rifampin still as prescribed by dermatology. We have been using silver collagen to all his wounds although the patient reports that he thinks silver alginate does better on the wounds on his buttock. 4/13; the area on his left lateral calf about the same size but it did not require debridement. Left dorsal foot just proximal to the webspace between the first and second toes is about the same. Still nonviable surface. I note some superficial bronze discoloration of the dorsal part of his foot Right dorsal foot just  proximal to the first and second toes also looks about the same. I still think there may be the same discoloration I noted above on the left Left buttock wound looks about the same 4/20; left lateral calf appears to be gradually contracting using silver collagen. 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 The left dorsal foot wound is debrided of tightly adherent necrotic debris and really cleans up quite nicely. The right area is worse with expansion. I did not debride this it is now over the base of the second toe The area on his left buttock is smaller no debridement is required using silver collagen 5/4; left calf continues to make good progress. He arrives with erythema around the wounds on his dorsal foot which even extends to the plantar aspect. Very concerning for coexistent infection. He is finished the erythromycin I gave him for possible erythrasma this does not seem to have helped. The area on the left foot is about the same base of the dorsal toes Is area on the buttock looks improved on the left 5/11; left calf and left buttock continued to make good progress. Left foot is about the same to slightly improved. Major problem is on the right foot. He has not had an x-ray. Deep  tissue culture I did last week showed both Enterobacter and [name]. coli. I did not change the doxycycline I put him on empirically although neither 1 of these were plated to doxycycline. He arrives today with the erythema looking worse on both the dorsal and plantar foot. Macerated skin on the bottom of the foot. he has not been systemically unwell 5/18-Patient returns at 1 week, left calf wound appears to be making some progress, left buttock wound appears slightly worse than last time, left foot wound looks slightly better, right foot redness is marginally better. X-ray of both feet show no air or evidence of osteomyelitis. Patient is finished his Omnicef and terbinafine. He continues to have macerated skin on the bottom of the left foot as well as right 5/26; left calf wound is better, left buttock wound appears to have multiple small superficial open areas with surrounding macerated skin. X-rays that I did last time showed no evidence of osteomyelitis in either foot. He is finished cefdinir and doxycycline. I do not think that he was on terbinafine. He continues to have [name] large superficial open area on the right foot anterior dorsal and slightly between the first and second toes. I did send him to dermatology 2 months ago or so wondering about whether they would do [name] fungal scraping. I do not believe they did but did do [name] culture. We have been using silver alginate to the toe areas, he has been using antifungals at home topically either ketoconazole or Lotrisone. We are using silver collagen on the left foot, silver alginate on the right, silver collagen on the left lateral leg and silver alginate on the left buttock 6/1; left buttock area is healed. We have the left dorsal foot, left lateral leg and right dorsal foot. We are using silver alginate to the areas on both feet and silver collagen to the area on his left lateral calf 6/8; the left buttock apparently reopened late last week. He is not really  sure how this happened. He is tolerating the terbinafine. Using silver alginate to all wounds 6/15; left buttock wound is larger than last week but still superficial. Came in the clinic today with [name] report of purulence  from the left lateral leg I did not identify any infection Both areas on his dorsal feet appear to be better. He is tolerating the terbinafine. Using silver alginate to all wounds 6/22; left buttock is about the same this week, left calf quite [name] bit better. His left foot is about the same however he comes in with erythema and warmth in the right forefoot once again. Culture that I gave him in the beginning of May showed Enterobacter and [name]. coli. I gave him doxycycline and things seem to improve although neither 1 of these organisms was specifically plated. 6/29; left buttock is larger and dry this week. Left lateral calf looks to me to be improved. Left dorsal foot also somewhat improved right foot completely unchanged. The erythema on the right foot is still present. He is completing the Ceftin dinner that I gave him empirically [see discussion above.) 7/6 - All wounds look to be stable and perhaps improved, the left buttock wound is slightly smaller, per patient bleeds easily, completed ceftin, the right foot redness is less, he is on terbinafine 7/13; left buttock wound about the same perhaps slightly narrower. Area on the left lateral leg continues to narrow. Left dorsal foot slightly smaller right foot about the same. We are using silver alginate on the right foot and Hydrofera Blue to the areas on the left. Unna boot on the left 2 layer compression on the Loja, Hayden [name] ([id number]) [phone].pdf Page 11 of 37 right 7/20; left buttock wound absolutely the same. Area on lateral leg continues to get better. Left dorsal foot require debridement as did the right no major change in the 7/27; left buttock wound the same size necrotic debris over the surface.  The area on the lateral leg is closed once again. His left foot looks better right foot about the same although there is some involvement now of the posterior first second toe area. He is still on terbinafine which I have given him for [name] month, not certain [name] centimeter major change 06/25/19-All wounds appear to be slightly improved according to report, left buttock wound looks clean, both foot wounds have minimal to no debris the right dorsal foot has minimal slough. We are using Hydrofera Blue to the left and silver alginate to the right foot and ischial wound. 8/10-Wounds all appear to be around the same, the right forefoot distal part has some redness which was not there before, however the wound looks clean and small. Ischial wound looks about the same with no changes 8/17; his wound on the left lateral calf which was his original chronic venous insufficiency wound remains closed. Since I last saw him the areas on the left dorsal foot right dorsal foot generally appear better but require debridement. The area on his left initial tuberosity appears somewhat larger to me perhaps hyper granulated and bleeds very easily. We have been using Hydrofera Blue to the left dorsal foot and silver alginate to everything else 8/24; left lateral calf remains closed. The areas on his dorsal feet on the webspace of the first and second toes bilaterally both look better. The area on the left buttock which is the pressure ulcer stage II slightly smaller. I change the dressing to Hydrofera Blue to all areas 8/31; left lateral calf remains closed. The area on his dorsal feet bilaterally look better. Using Hydrofera Blue. Still requiring debridement on the left foot. No change in the left buttock pressure ulcers however 9/14; left lateral calf remains closed. Dorsal feet look  quite [name] bit better than 2 weeks ago. Flaking dry skin also [name] lot better with the ammonium lactate I gave him 2 weeks ago. The area on the left  buttock is improved. He states that his Roho cushion developed [name] leak and he is getting [name] new one, in the interim he is offloading this vigorously 9/21; left calf remains closed. Left heel which was [name] possible DTI looks better this week. He had macerated tissue around the left dorsal foot right foot looks satisfactory and improved left buttock wound. I changed his dressings to his feet to silver alginate bilaterally. Continuing Hydrofera Blue on the left buttock. 9/28 left calf remains closed. Left heel did not develop anything [possible DTI] dry flaking skin on the left dorsal foot. Right foot looks satisfactory. Improved left buttock wound. We are using silver alginate on his feet Hydrofera Blue on the buttock. I have asked him to go back to the Lotrisone on his feet including the wounds and surrounding areas 10/5; left calf remains closed. The areas on the left and right feet about the same. [name] lot of this is epithelialized however debris over the remaining open areas. He is using Lotrisone and silver alginate. The area on the left buttock using Hydrofera Blue 10/26. Patient has been out for 3 weeks secondary to Covid concerns. He tested negative but I think his wife tested positive. He comes in today with the left foot substantially worse, right foot about the same. Even more concerning he states that the area on his left buttock closed over but then reopened and is considerably deeper in one aspect than it was before [stage III wound] 11/2; left foot really about the same as last week. Quarter sized wound on the dorsal foot just proximal to the first second toes. Surrounding erythema with areas of denuded epithelium. This is not really much different looking. Did not look like cellulitis this time however. Right foot area about the same.. We have been using silver alginate alginate on his toes Left buttock still substantial irritated skin around the wound which I think looks somewhat better. We  have been using Hydrofera Blue here. 11/9; left foot larger than last week and [name] very necrotic surface. Right foot I think is about the same perhaps slightly smaller. Debris around the circumference also addressed. Unfortunately on the left buttock there is been [name] decline. Satellite lesions below the major wound distally and now [name] an additional one posteriorly we have been using Hydrofera Blue but I think this is [name] pressure issue 11/16; left foot ulcer dorsally again [name] very adherent necrotic surface. Right foot is about the same. Not much change in the pressure ulcer on his left buttock. 11/30; left foot ulcer dorsally basically the same as when I saw him 2 weeks ago. Very adherent fibrinous debris on the wound surface. Patient reports [name] lot of drainage as well. 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 We have been using Iodoflex, patient changed back to alginate because of the drainage. Area on his right dorsal foot really looks benign with [name] healthier surface certainly [name] lot better than on the left. Left buttock wounds all improved using Hydrofera Blue 12/7; left dorsal foot again no improvement. Tightly adherent debris. PCR culture I did last week only showed likely skin contaminant. I have gone ahead and done [name] punch biopsy of this which is about the last thing in terms of investigations I can think to do.  He has known venous insufficiency and venous hypertension and this could be the issue here. The area on the right foot is about the same left buttock slightly worse according to our intake nurse secondary to [facility] Blue sticking to the wound 12/14; biopsy of the left foot that I did last time showed changes that could be related to wound healing/chronic stasis dermatitis phenomenon no neoplasm. We have been using silver alginate to both feet. I change the one on the left today to Sorbact and silver alginate to his other 2 wounds 12/28; the  patient arrives with the following problems; Major issue is the dorsal left foot which continues to be [name] larger deeper wound area. Still with [name] completely nonviable surface Paradoxically the area mirror image on the right on the right dorsal foot appears to be getting better. He had some loss of dry denuded skin from the lower part of his original wound on the left lateral calf. Some of this area looked [name] little vulnerable and for this reason we put him in wrap that on this side this week The area on his left buttock is larger. He still has the erythematous circular area which I think is [name] combination of pressure, sweat. This does not look like cellulitis or fungal dermatitis 11/26/2019; -Dorsal left foot large open wound with depth. Still debris over the surface. Using Sorbact The area on the dorsal right foot paradoxically has closed over He has [name] reopening on the left ankle laterally at the base of his original wound that extended up into the calf. This appears clean. The left buttock wound is smaller but with very adherent necrotic debris over the surface. We have been using silver alginate here as well The patient had arterial studies done in 2017. He had biphasic waveforms at the dorsalis pedis and posterior tibial bilaterally. ABI in the left was 1.17. Digit waveforms were dampened. He has slight spasticity in the great toes I do not think [name] TBI would be possible 1/11; the patient comes in today with [name] sizable reopening between the first and second toes on the right. This is not exactly in the same location where we have been treating wounds previously. According to our intake nurse this was actually fairly deep but 0.6 cm. The area on the left dorsal foot looks about the same the surface is somewhat cleaner using Sorbact, his MRI is in 2 days. We have not managed yet to get arterial studies. The new reopening on the left lateral calf looks somewhat better using alginate. The left buttock wound  is about the same using alginate 1/18; the patient had his ARTERIAL studies which were quite normal. ABI in the right at 1.13 with triphasic/biphasic waveforms on the left ABI 1.06 again with triphasic/biphasic waveforms. It would not have been possible to have done [name] toe brachial index because of spasticity. We have been using Sorbac to the left foot alginate to the rest of his wounds on the right foot left lateral calf and left buttock 1/25; arrives in clinic with erythema and swelling of the left forefoot worse over the first MTP area. This extends laterally dorsally and but also posteriorly. Still has an area on the left lateral part of the lower part of his calf wound it is eschared and clearly not closed. Area on the left buttock still with surrounding irritation and erythema. Right foot surface wound dorsally. The area between the right and first and second toes appears better. 2/1; The left foot  wound is about the same. Erythema slightly better I gave him [name] week of doxycycline empirically Right foot wound is more extensive extending between the toes to the plantar surface Left lateral calf really no open surface on the inferior part of his original wound however the entire area still looks vulnerable Absolutely no improvement in the left buttock wound required debridement. 2/8; the left foot is about the same. Erythema is slightly improved I gave him clindamycin last week. Right foot looks better he is using Lotrimin and silver alginate He has [name] breakdown in the left lateral calf. Denuded epithelium which I have removed Left buttock about the same were using Hydrofera Blue 2/15; left foot is about the same there is less surrounding erythema. Surface still has tightly adherent debris which I have debriding however not making any progress Right foot has [name] substantial wound on the medial right second toe between the first and second webspace. Hobin, MANTAJ CHAMBERLIN ([id number])  [id number] Page 12 of 37 Still an open area on the left lateral calf distal area. Buttock wound is about the same 2/22; left foot is about the same less surrounding erythema. Surface has adherent debris. Polymen Ag Right foot area significant wound between the first and second toes. We have been using silver alginate here Left lateral leg polymen Ag at the base of his original venous insufficiency wound Left buttock some improvement here 3/1; Right foot is deteriorating in the first second toe webspace. Larger and more substantial. We have been using silver alginate. Left dorsal foot about the same markedly adherent surface debris using PolyMem Ag Left lateral calf surface debris using PolyMem AG Left buttock is improved again using PolyMem Ag. He is completing his terbinafine. The erythema in the foot seems better. He has been on this for 2 weeks 3/8; no improvement in any wound area in fact he has [name] small open area on the dorsal midfoot which is new this week. He has not gotten his foot x-rays yet 3/15; his x-rays were both negative for osteomyelitis of both feet. No major change in any of his wounds on the extremities however his buttock wounds are better. We have been using polymen on the buttocks, left lower leg. Iodoflex on the left foot and silver alginate on the right 3/22; arrives in clinic today with the 2 major issues are the improvement in the left dorsal foot wound which for once actually looks healthy with [name] nice healthy wound surface without debridement. Using Iodoflex here. Unfortunately on the left lateral calf which is in the distal part of his original wound he came to the clinic here for there was purulent drainage noted some increased breakdown scattered around the original area and [name] small area proximally. We we are using polymen here will change to silver alginate today. His buttock wound on the left is better and I think the area on the right  first second toe webspace is also improved 3/29; left dorsal foot looks better. Using Iodoflex. Left ankle culture from deterioration last time grew [name]. coli, Enterobacter and Enterococcus. I will give him [name] course of cefdinir although that will not cover Enterococcus. The area on the right foot in the webspace of the first and second toe lateral first toe looks better. The area on his buttock is about healed Vascular appointment is on April 21. This is to look at his venous system vis--vis continued breakdown of the wounds on the left including the left lateral leg and left dorsal foot  he. He has had previous ablations on this side 4/5; the area between the right first and second toes lateral aspect of the first toe looks better. Dorsal aspect of the left first toe on the left foot also improved. Unfortunately the left lateral lower leg is larger and there is [name] second satellite wound superiorly. The usual superficial abrasions on the left buttock overall better but certainly not closed 4/12; the area between the right first and second toes is improved. Dorsal aspect of the left foot also slightly smaller with [name] vibrant healthy looking surface. No real change in the left lateral leg and the left buttock wound is healed He has an unaffordable co-pay for Apligraf. Appointment with vein and vascular with regards to the left leg venous part of the circulation is on 4/21 4/19; we continue to see improvement in all wound areas. Although this is minor. He has his vascular appointment on 4/21. The area on the left buttock has not reopened although right in the center of this area the skin looks somewhat threatened 4/26; the left buttock is unfortunately reopened. In general his left dorsal foot has [name] healthy surface and looks somewhat smaller although it was not measured as such. The area between his first and second toe webspace on the right as [name] small wound against the first toe. The patient saw vascular  surgery. The real question I was asking was about the small saphenous vein on the left. He has previously ablated left greater saphenous vein. Nothing further was commented on on the left. Right greater saphenous vein without reflux at the saphenofemoral junction or proximal thigh there was no indication for ablation of the right greater saphenous vein duplex was negative for DVT bilaterally. They did not think there was anything from [name] vascular surgery point of view that could be offered. They ABIs within normal limits 5/3; only small open area on the left buttock. The area on the left lateral leg which was his original venous reflux is now 2 wounds both which look clean. We are using Iodoflex on the left dorsal foot which looks healthy and smaller. He is down to [name] very tiny area between the right first and second toes, using silver alginate 5/10; all of his wounds appear better. We have much better edema control in 4 layer compression on the left. This may be the factor that is allowing the left foot and left lateral calf to heal. He has external compression garments at home 04/14/20-All of his wounds are progressing well, the left forefoot is practically closed, left ischium appears to be about the same, right toe webspace is also smaller. The left lateral leg is about the same, continue using Hydrofera Blue to this, silver alginate to the ischium, Iodoflex to the toe space on the right 6/7; most of his wounds outside of the left buttock are doing well. The area on the left lateral calf and left dorsal foot are smaller. The area on the right foot in between the first and second toe webspace is barely visible although he still says there is some drainage here is the only reason I did not heal this out. Unfortunately the area on the left buttock almost looks like he has [name] skin tear from tape. He has open wound and then [name] large flap of skin that we are trying to get adherence over an area just next to  the remaining wound 6/21; 2 week follow-up. I believe is been here for nurse visits. Miraculously  the area between his first and second toes on the left dorsal foot is closed over. Still open on the right first second web space. The left lateral calf has 2 open areas. Distally this is more superficial. The proximal area had [name] little more depth and required debridement of adherent necrotic material. His buttock wound is actually larger we have been using silver alginate here 6/28; the patient's area on the left foot remains closed. Still open wet area between the first and second toes on the right and also extending into the plantar aspect. We have been using silver alginate in this location. He has 2 areas on the left lower leg part of his original long wounds which I think are better. We have been using Hydrofera Blue here. Hydrofera Blue to the left buttock which is stable 7/12; left foot remains closed. Left ankle is closed. May be [name] small area between his right first and second toes the only truly open area is on the left buttock. We have been using Hydrofera Blue here 7/19; patient arrives with marked deterioration especially in the left foot and ankle. We did not put him in [name] compression wrap on the left last week in fact he wore his juxta lite stockings on either side although he does not have an underlying stocking. He has [name] reopening on the left dorsal foot, left lateral ankle and [name] new area on the right dorsal ankle. More worrisome is the degree of erythema on the left foot extending on the lateral foot into the lateral lower leg on the left 7/26; the patient had erythema and drainage from the lateral left ankle last week. Culture of this grew MRSA resistant to doxycycline and clindamycin which are the 2 antibiotics we usually use with this patient who has multiple antibiotic allergies including linezolid, trimethoprim sulfamethoxazole. I had give him an empiric doxycycline and he comes in  the area certainly looks somewhat better although it is blotchy in his lower leg. He has not been systemically unwell. He has had areas on the left dorsal foot which is [name] reopening, chronic wounds on the left lateral ankle. Both of these I think are secondary to chronic venous insufficiency. The area between his first and second toes is closed as far as I can tell. He had [name] new wrap injury on the right dorsal ankle last week. Finally he has an area on the left buttock. We have been using silver alginate to everything except the left buttock we are using Hydrofera Blue 06/30/20-Patient returns at 1 week, has been given [name] sample dose pack of NUZYRA which is [name] tetracycline derivative [omadacycline], patient has completed those, we have been using silver alginate to almost all the wounds except the left ischium where we are using Hydrofera Blue all of them look better 8/16; since I last saw the patient he has been doing well. The area on the left buttock, left lateral ankle and left foot are all closed today. He has completed the [place] I gave him last time and tolerated this well. He still has open areas on the right dorsal ankle and in the right first second toe area which we are using silver alginate. 8/23; we put him in his bilateral external compression stockings last week as he did not have anything open on either leg except for concerning area between the right first and second toe. He comes in today with an area on the left dorsal foot slightly more proximal than the original wound,  the left lateral foot but this is actually [name] continuation of the area he had on the left lateral ankle from last time. As well he is opened up on the left buttock again. 8/30; comes in today with things looking [name] lot better. The area on the left lower ankle has closed down as has the left foot but with eschar in both areas. The area on the dorsal right ankle is also epithelialized. Very little remaining of the left buttock  wound. We have been using silver alginate on all wound areas 9/13; the area in the first second toe webspace on the right has fully epithelialized. He still has some vulnerable epithelium on the right and the ankle and the dorsal foot. He notes weeping. He is using his juxta lite stocking. On the left again the left dorsal foot is closed left lateral ankle is closed. We went to the juxta lite stocking here as well. Still vulnerable in the left buttock although only 2 small open areas remain here 9/27; 2-week follow-up. We did not look at his left leg but the patient says everything is closed. He is [name] bit disturbed by the amount of edema in his left foot he is using juxta lite stockings but asking about over the toes stockings which would be 30/40, will talk to him next time. According to him there is no open wound on either the left foot or the left ankle/calf He has an open area on the dorsal right calf which I initially point [name] wrap injury. He has superficial remaining wound on the left ischial tuberosity been using silver alginate although he says this sticks to the wound Opperman, Mukesh [name] ([id number]) [id number].pdf Page 13 of 37 10/5; we gave him 2-week follow-up but he called yesterday expressing some concerns about his right foot right ankle and the left buttock. He came in early. There is still no open areas on the left leg and that still in his juxta lite stocking 10/11; he only has 1 small area on the left buttock that remains measuring millimeters 1 mm. Still has the same irritated skin in this area. We recommended zinc oxide when this eventually closes and pressure relief is meticulously is he can do this. He still has an area on the dorsal part of his right first through third toes which is [name] bit irritated and still open and on the dorsal ankle near the crease of the ankle. We have been using silver alginate and using his own stocking. He has nothing open on the left  leg or foot 10/25; 2-week follow-up. Not nearly as good on the left buttock as I was hoping. For open areas with 5 looking threatened small. He has the erythematous irritated chronic skin in this area. 1 area on the right dorsal ankle. He reports this area bleeds easily Right dorsal foot just proximal to the base of his toes We have been using silver alginate. 11/8; 2-week follow-up. Left buttock is about the same although I do not think the wounds are in the same location we have been using silver alginate. I have asked him to use zinc oxide on the skin around the wounds. He still has [name] small area on the right dorsal ankle he reports this bleeds easily Right dorsal foot just proximal to the base of the toes does not have anything open although the skin is very dry and scaly He has [name] new opening on the nailbed of the left great toe. Nothing on the  left ankle 11/29; 3-week follow-up. Left buttock has 2 open areas. And washing of these wounds today started bleeding easily. Suggesting very friable tissue. We have been using silver alginate. Right dorsal ankle which I thought was initially [name] wrap injury we have been using silver alginate. Nothing open between the toes that I can see. He states the area on the left dorsal toe nailbed healed after the last visit in 2 or 3 days 12/13; 3-week follow-up. His left buttock now has 3 open areas but the original 2 areas are smaller using polymen here. Surrounding skin looks better. The right dorsal ankle is closed. He has [name] small opening on the right dorsal foot at the level of the third toe. In general the skin looks better here. He is wearing his juxta lite stocking on the left leg says there is nothing open 11/24/2020; 3 weeks follow-up. His left buttock still has the 3 open areas. We have been using polymen but due to lack of response he changed to [facility] area. Surrounding skin is dry erythematous and irritated looking. There is no evidence of  infection either bacterial or fungal however there is loss of surface epithelium He still has very dry skin in his foot causing irritation and erythema on the dorsal part of his toes. This is not responded to prolonged courses of antifungal simply looks dry and irritated 1/24; left buttock area still looks about the same he was unable to find the triad ointment that we had suggested. The area on the right lower leg just above the dorsal ankle has reopened and the areas on the right foot between the first second and second third toes and scaling on the bottom of the foot has been about the same for quite some time now. been using silver alginate to all wound areas 2/7; left buttock wound looked quite good although not much smaller in terms of surface area surrounding skin looks better. Only [name] few dry flaking areas on the right foot in between the first and second toes the skin generally looks better here [ammonium lactate]. Finally the area on the right dorsal ankle is closed 2/21; There is no open area on the right foot even between the right first and second toe. Skin around this area dorsally and plantar aspects look better. He has [name] reopening of the area on the right ankle just above the crease of the ankle dorsally. I continue to think that this is probably friction from spasms may be even this time with his stocking under the compression stockings. Wounds on his left buttock look about the same there [name] couple of areas that have reopened. He has [name] total square area of loss of epithelialization. This does not look like infection it looks like [name] contact dermatitis but I just cannot determine to what 3/14; there is nothing on the right foot between the first and second toes this was carefully inspected under illumination. Some chronic irritation on the dorsal part of his foot from toes 1-3 at the base. Nothing really open here substantially. Still has an area on the right foot/ankle that is actually  larger and hyper granulated. His buttock area on the left is just about closed however he has chronic inflammation with loss of the surface epithelial layer 3/28; 2-week follow-up. In clinic today with [name] new wound on the left anterior mid tibia. Says this happened about 2 weeks ago. He is not really sure how wonders about the spasticity of his legs at  night whether that could have caused this other than that he does not have [name] good idea. He has been using topical antibiotics and silver alginate. The area on his right dorsal ankle seems somewhat better. Finally everything on his left buttock is closed. 4/11; 2-week follow-up. All of his wounds are better except for the area over the ischium and left buttock which have opened up widely again. At least part of this is covered in necrotic fibrinous material another part had rolled nonviable skin. The area on the right ankle, left anterior mid tibia are both [name] lot better. He had no open wounds on either foot including the areas between the first and second toes 4/25; patient presents for 2-week follow-up. He states that the wounds are overall stable. He has no complaints today and states he is using Hydrofera Blue to open wounds. 5/9; have not seen this man in over [name] month. For my memory he has open areas on the left mid tibia and right ankle. T oday he has new open area on the right dorsal foot which we have not had [name] problem with recently. He has the sustained area on the left buttock He is also changed his insurance at the beginning of the year [employer]. We will need prior authorizations for debridement 5/23; patient presents for 2-week follow-up. He has prior authorizations for debridement. He denies any issues in the past 2 weeks with his wound care. He has been using Hydrofera Blue to all the wounds. He does report [name] circular rash to the upper left leg that is new. He denies acute signs of infection. 6/6; 2-week follow-up. The patient has open  wounds on the left buttock which are worse than the last time I saw this about [name] month ago. He also has [name] new area to me on the left anterior mid tibia with some surrounding erythema. The area on the dorsal ankle on the right is closed but I think this will be [name] friction injury every time this area is exposed to either our wraps or his compression stockings caused by unrelenting spasms in this leg. 6/20; 2-week follow-up. The patient has open wounds on the left buttock which is about the same. Using [facility] here. - The left mid tibia has [name] static amount of surrounding erythema. Also [name] raised area in the center. We have been using Hydrofera Blue here. Finally he has broken down in his dorsal right foot extending between the first and second toes and going to the base of the first and second toe webspace. I have previously assumed that this was severe venous hypertension 7/5; 2-week follow-up The left buttock wound actually looks better. We are using Hydrofera Blue. He has extensive skin irritation around this area and I have not really been able to get that any better. I have tried Lotrisone i.[name]. antifungals and steroids. More most recently we have just been using Coloplast really looks about the same. The left mid tibia which was new last week culture to have very resistant staph aureus. Not only methicillin-resistant but doxycycline resistant. The patient has [name] plethora of antibiotic allergies including sulfa, linezolid. I used topical bacitracin on this but he has not started this yet. In addition he has an expanding area of erythema with [name] wound on the dorsal right foot. I did [name] deep tissue culture of this area today 7/12; Left buttock area actually looks better surrounding skin also looks less irritated. Left mid tibia looks about the same.  He is using bacitracin this is not worse Right dorsal foot looks about the same as well. The left first toe also looks about the same 7/19; left  buttock wound continues to improve in terms of open areas Left mid tibia is still concerning amount of swelling he is using bacitracin Dorsal left first toe somewhat smaller Right dorsal foot somewhat smaller 7/25; left buttock wound actually continues to improve Left mid tibia area has less swelling. I gave him all my samples of new Nuzyra. This seems to have helped although the wound is still open it. His abrasion closed by here Left dorsal great toe really no better. Still [name] very nonviable surface Right dorsal foot perhaps some better. We have been using bacitracin and silver nitrate to the areas on his lower legs and Hydrofera Blue to the area on the buttock. 8/16 Disappointed that his left buttock wound is actually more substantial. Apparently during the last nurse visit these were both very small. He has continued irritation to [name] large area of skin on his buttock. I have never been able to totally explain this although I think it some combination of the way he sits, pressure, Balling, Bruk [name] ([id number]) [phone].pdf Page 14 of 37 moisture. He is not incontinent enough to contribute to this. Left dorsal great toe still fibrinous debris on the surface that I have debrided today Large area across the dorsal right toes. The area on the left anterior mid tibia has less swelling. He completed the [place]. This does not look infected although the tissue is still fried 8/30; 2-week follow-up. Left buttock areas not improved. We used Hydrofera Blue on this. Weeping wet with the surrounding erythema that I have not been able to control even with Lotrisone and topical Coloplast Left dorsal great toe looks about the same More substantial area again at the base of his toes on the left which is new this week. Area across the dorsal right toes looks improved The left anterior mid tibia looks like it is trying to close 9/13; 2-week follow-up. Using silver alginate on all of his  wounds. The left dorsal foot does not look any better. He has the area on the dorsal toe and also the areas at the base of all of his toes 1 through 3. On the right foot he has [name] similar pattern in [name] similar area. He has the area on his left mid tibia that looks fairly healthy. Finally the area on his left buttock looks somewhat bette 9/20; culture I did of the left foot which was [name] deep tissue culture last time showed [name]. coli he has erythema around this wound. Still [name] completely necrotic surface. His right dorsal foot looks about the same. He has [name] very friable surface to the left anterior mid tibia. Both buttock wounds look better. We have been using silver alginate to all wounds 10/4; he has completed the cephalexin that I gave him last time for the left foot. He is using topical gentamicin under silver alginate silver alginate being applied to all the wounds. Unfortunately all the wounds look irritated on his dorsal right foot dorsal left foot left mid tibia. I wonder if this could be [name] silver allergy. I am going to change him to [facility] on the lower extremity. The skin on the left buttock and left posterior thigh still flaking dry and irritated. This has continued no matter what I have applied topically to this. He has [name] solitary open wound which by itself  does not look too bad however the entire area of surrounding skin does not change no matter what we have applied here 10/18; the area on the left dorsal foot and right dorsal foot both look better. The area on the right extends into the plantar but not between his toes. We have been using silver alginate. He still has [name] rectangular erythematous area around the area on the left tibia. The wound itself is very small. Finally everything on his left buttock looks [name] little larger the skin is erythematous 11/15; patient comes in with the left dorsal and right dorsal foot distally looking somewhat better. Still nonviable surface on the left foot  which required debridement. He still has the area on the left anterior mid tibia although this looks somewhat better. He has [name] new area on the right lateral lower leg just above the ankle. Finally his left buttock looks terrible with multiple superficial open wounds geometric square shaped area of chronic erythema which I have not been able to sort through 11/29; right dorsal foot and left dorsal foot both look somewhat better. No debridement required. He has the fragile area on the left anterior mid tibia this looks and continues to look somewhat better. Right lateral lower leg just above the ankle we identified last time also looks better. In general the area on his left buttock looks improved. We are using Hydrofera Blue to all wound areas 12/13; right dorsal foot looks better. The area on the right lateral leg is healed. Left dorsal foot has 2 open areas both of which require debridement. The fragile area on the left anterior mid tibial looks better. Smaller area on his buttocks. Were using Hydrofera Blue 1/10; patient comes in with everything looking slightly larger and/or worse. This includes his left buttock, reopening of the left mid tibia, larger areas on the left dorsal foot and what looks to be [name] cellulitis on the right dorsal and plantar foot. We have been using Hydrofera Blue on all wounds. 1/17; right dorsal foot distally looks better today. The left foot has 2 open wounds that are about the same surrounding erythema. 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 No malignancy they did not stain for fungus 1/24; his right distal foot is not closed dry and scaly but the wound looks like it is contracting. I did not debride anything here. Problem on his left dorsal foot with expanding erythema. Apparently there were problems last week getting the Riki Altes  however it is now available at the [employer] [facility] but [name] week later. He is using ketoconazole and Coloplast to the left buttock along with Hydrofera Blue this actually looks quite [name] bit better today. 1/31; right dorsal foot again is dry and scaly but looking to contract. He has been using [name] moisturizer on his feet at my request but he is not sure which 1. The left dorsal foot wounds look about the same there is erythema here that I marked last week however after course of Nuzyra it certainly is not any better but not any worse either. Finally on his left buttock the skin continues to look better he has the original wound but [name] new substantial area towards the gluteal cleft. Almost like [name] skin tear. I used scissors to remove skin and subcutaneous tissue here silver nitrate and direct pressure 2/7; right dorsal foot. This does not look too much different  from last week. Some erythema skin dry and scaly. No debridement. Left dorsal great toe again still not much improvement. I did remove flaking dry skin and callus from around the edge. Finally on his left buttock. The skin is somewhat better in the periwound. Surface wounds are superficial somewhat better than last week. 01/26/2022: Is [name] little bit of [name] mystery as to why his wounds fail to respond to treatments and actually seem to get worse. This is my first encounter with this patient. He was previously followed by Dr. Leanord Hawking. Based upon my review of the chart, it seems that there is [name] little bit of [name] mystery as to why his wounds do not respond as anticipated to the interventions applied and sometimes even get worse. Biopsies have been performed and he was seen by dermatology in [place], but that did not shed any light on the matter. T oday, his gluteal wound is larger, with substantial drainage, rather malodorous. The food wounds are not terrible, but he has [name] lot of callus and scaly skin around these. He is currently getting silver alginate on  the gluteal wound, with idodoflex to the feet. He is using lotrisone on his legs for the dry, scaly skin. 02/09/2022: There has really been no change to any of his wounds. The gluteal wound less drainage and odor, but remains about the same size, the periwound skin remains oddly scaly. His lower extremity wounds also appear roughly the same size. They continue to accumulate [name] small amount of slough. The periwound on his feet and ankle wounds has dry eschar and loose dead skin. We have been using silver alginate on the gluteal wound and Iodoflex on his feet and ankle wounds. T the periwound around his gluteal lesion and Lotrisone on his feet and legs. o 02/23/2022: The right plantar foot wound is closed. The gluteal site looks small but has continued to produce hypertrophic granulation tissue. The foot wounds all look about the same on the dorsal surface of the right foot; on the left, there is only [name] small open area at the site of where his left great toenail would have been. 03/16/2022: The right ankle wound is healed. The right dorsal foot wound is about the same. The left dorsal foot wound is quite [name] bit smaller and the ischial wound is nearly closed. 03/30/2022: The right ankle wound reopened. Both dorsal foot wounds are quite [name] bit smaller. Unfortunately, he appears to have sheared part of his ischial wound open further, perhaps during [name] transfer. 04/13/2022: The right ankle wound has hypertrophic granulation tissue present. The dorsal foot wounds continue to decrease in size. The ischial wound looks about the same today, no better, no worse. 04/27/2022: The right ankle wound has closed. Unfortunately, it looks like some moisture got underneath the dry skin on both of his dorsal feet and these wounds have expanded in size. The ischial wound remains the same with perhaps [name] little bit more slough accumulation than at our previous visit. 05/11/2022: The right ankle wound remains closed. There is [name] left  anterior tibial wound that is small has patchy openings with accumulated slough. The dorsum of his right foot appears to be nearly healed with just [name] small punctate opening. The plantar surface of his right foot has [name] new opening that looks like he may have picked some skin there. His sacral ulcer has hypertrophic granulation tissue but has some slough accumulation. The dorsum of his left foot has multiple Troy, Izik [name] ([id number]) [phone].pdf  Page 15 of 37 open areas in [name] fairly ragged distribution. All of these have slough accumulated within them. 06/01/2022: The right ankle and left anterior tibial wound are both closed. Dorsum of his right foot and left foot both look substantially better with just tiny scattered openings The without any slough accumulation. He has sheared open new areas on his left gluteus and ischium. He says that his wheelchair cushion, which is air-filled, has [name] leak and so it keeps deflating. He is awaiting [name] new cushion. 06/15/2022: The right ankle wound has reopened and the fat layer is exposed. Both dorsal feet have just small openings with just [name] little bit of slough and eschar accumulation. The wound on his left gluteus and ischium is larger again today and has [name] foul odor. 06/29/2022: The right ankle wound has hypertrophic granulation tissue buildup. His dorsal foot wounds both look better with just some eschar on the surface. He has [name] new wound on his left lateral ankle. He is not sure how he acquired it but by appearance, it looks that he hit it on something, potentially his wheelchair or bed. The ischial wound is about the same but is cleaner without any significant purulent drainage or odor. He did not understand what the [facility] call was about and therefore he does not have the topical compounded antibiotic. 07/13/2022: The right ankle wound again has hypertrophic granulation tissue, but less so than at his previous visit. The ischial wound  has improved tremendously the use of the [facility] topical antibiotic. No significant change to the left lateral ankle wound; it is fibrotic with slough present. The skin of both of his feet, especially on the right, has [name] yeasty appearance. 08/10/2022: There is again hypertrophic granulation tissue on the right anterior ankle wound. Both feet are about the same. The left lateral ankle wound is [name] little bit desiccated and has some slough buildup. He unfortunately suffered [name] new injury when he was removing his pants and they caught his bandage which caused [name] large skin tear on his left ischium,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 show reflux. His wounds on the right leg give healed on the left he is still using Santyl. 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 The erythema has not really resolved and I wonder if this is mostly venous inflammation rather than cellulitis. Still using Santyl. He is approved for Apligraf 03/04/16; there is less erythema around the wound. Both wounds require aggressive surgical debridement. Not yet ready for Apligraf 03/11/16; aggressive debridement again. Not ready for Apligraf 03/18/16 aggressive debridement again. Not ready for Apligraf  disorder continue Santyl. Has been to see vascular surgery he is being planned for [name]  venous ablation 03/25/16; aggressive debridement again of both wound areas on the left lateral leg. He is due for ablation surgery on May 22. He is much closer to being ready Cumba, Curties [name] ([id number]) [phone].pdf Page 21 of 37 for an Apligraf. Has [name] new area between the left first and second toes 04/01/16 aggressive debridement done of both wounds. The new wound at the base of between his second and first toes looks stable 04/08/16; continued aggressive debridement of both wounds on the left lower leg. He goes for his venous ablation on Monday. The new wound at the base of his first and second toes dorsally appears stable. 04/15/16; wounds aggressively debridement although the base of this looks considerably better Apligraf #1. He had ablation surgery on Monday I'll need to research these records. We only have approval for four Apligraf's 04/22/16; the patient is here for [name] wound check [Apligraf last week] intake nurse concerned about erythema around the wounds. Apparently [name] significant degree of drainage. The patient has chronic venous inflammation which I think accounts for most of this however I was asked to look at this today 04/26/16; the patient came back for check of possible cellulitis in his left foot however the Apligraf dressing was inadvertently removed therefore we elected to prep the wound for [name] second Apligraf. I put him on doxycycline on 6/1 the erythema in the foot 05/03/16 we did not remove the dressing from the superior wound as this is where I put all of his last Apligraf. Surface debridement done with [name] curette of the lower wound which looks very healthy. The area on the left foot also looks quite satisfactory at the dorsal artery at the first and second toes 05/10/16; continue Apligraf to this. Her wound, Hydrafera to the lower wound. He has [name] new area on the right second toe. Left dorsal foot firstoosecond toe also looks improved 05/24/16; wound dimensions  must be smaller I was able to use Apligraf to all 3 remaining wound areas. 06/07/16 patient's last Apligraf was 2 weeks ago. He arrives today with the 2 wounds on his lateral left leg joined together. This would have to be seen as [name] negative. He also has [name] small wound in his first and second toe on the left dorsally with quite [name] bit of surrounding erythema in the first second and third toes. This looks to be infected or inflamed, very difficult clinical call. 06/21/16: lateral left leg combined wounds. Adherent surface slough area on the left dorsal foot at roughly the fourth toe looks improved 07/12/16; he now has [name] single linear wound on the lateral left leg. This does not look to be [name] lot changed from when I lost saw this. The area on his dorsal left foot looks considerably better however. 08/02/16; no major change in the substantial area on his left lateral leg since last time. We have been using Hydrofera Blue for [name] prolonged period of time now. The area on his left foot is also unchanged from last review 07/19/16; the area on his dorsal foot on the left looks considerably smaller. He is beginning to have significant rims of epithelialization on the lateral left leg wound. This also looks better. 08/05/16; the patient came in for [name] nurse visit today. Apparently the area on his left lateral leg looks better and it was wrapped. However in general discussion the patient noted [name] new area on the  dorsal aspect of his right second toe. The exact etiology of this is unclear but likely relates to pressure. 08/09/16 really the area on the left lateral leg did not really look that healthy today perhaps slightly larger and measurements. The area on his dorsal right second toe is improved also the left foot wound looks stable to improved 08/16/16; the area on the last lateral leg did not change any of dimensions. Post debridement with [name] curet the area looked better. Left foot wound improved and the area on the  dorsal right second toe is improved 08/23/16; the area on the left lateral leg may be slightly smaller both in terms of length and width. Aggressive debridement with [name] curette afterwards the tissue appears healthier. Left foot wound appears improved in the area on the dorsal right second toe is improved 08/30/16 patient developed [name] fever over the weekend and was seen in an urgent care. Felt to have [name] UTI and put on doxycycline. He has been since changed over the phone to [facility]. After we took off the wrap on his right leg today the leg is swollen warm and erythematous, probably more likely the source of the fever 09/06/16; have been using collagen to the major left leg wound, silver alginate to the area on his anterior foot/toes 09/13/16; the areas on his anterior foot/toes on both sides appear to be virtually closed. Extensive wound on the left lateral leg perhaps slightly narrower but each visit still covered an adherent surface slough 09/16/16 patient was in for his usual Thursday nurse visit however the intake nurse noted significant erythema of his dorsal right foot. He is also running [name] low- grade fever and having increasing spasms in the right leg 09/20/16 here for cellulitis involving his right great toes and forefoot. This is [name] lot better. Still requiring debridement on his left lateral leg. Santyl direct says he needs prior authorization. Therefore his wife cannot change this at home 09/30/16; the patient's extensive area on the left lateral calf and ankle perhaps somewhat better. Using Santyl. The area on the left toes is healed and I think the area on his right dorsal foot is healed as well. There is no cellulitis or venous inflammation involving the right leg. He is going to need compression stockings here. 10/07/16; the patient's extensive wound on the left lateral calf and ankle does not measure any differently however there appears to be less adherent surface slough using Santyl and  aggressive weekly debridements 10/21/16; no major change in the area on the left lateral calf. Still the same measurement still very difficult to debridement adherent slough and nonviable subcutaneous tissue. This is not really been helped by several weeks of Santyl. Previously for 2 weeks I used Iodoflex for [name] short period. [name] prolonged course of Hydrofera Blue didn't really help. I'm not sure why I only used 2 weeks of Iodoflex on this there is no evidence of surrounding infection. He has [name] small area on the right second toe which looks as though it's progressing towards closure 10/28/16; the wounds on his toes appear to be closed. No major change in the left lateral leg wound although the surface looks somewhat better using Iodoflex. He has had previous arterial studies that were normal. He has had reflux studies and is status post ablation although I don't have any exact notes on which vein was ablated. I'll need to check the surgical record 11/04/16; he's had [name] reopening between the first and second toe on the left  and right. No major change in the left lateral leg wound. There is what appears to be cellulitis of the left dorsal foot 11/18/16 the patient was hospitalized initially in [place] and then subsequently transferred to [facility] and was admitted there from 11/09/16 through 11/12/16. He had developed progressive cellulitis on the right leg in spite of the doxycycline I gave him. I'd spoken to the hospitalist in [place] who was concerned about continuing leukocytosis. CT scan is what I suggested this was done which showed soft tissue swelling without evidence of osteomyelitis or an underlying abscess blood cultures were negative. At [facility] he was treated with vancomycin and Primaxin and then add an infectious disease consult. He was transitioned to Ceftaroline. He has been making progressive improvement. Overall [name] severe cellulitis of the right leg. He is been using silver alginate  to her original wound on the left leg. The wounds in his toes on the right are closed there is [name] small open area on the base of the left second toe 11/26/15; the patient's right leg is much better although there is still some edema here this could be reminiscent from his severe cellulitis likely on top of some degree of lymphedema. His left anterior leg wound has less surface slough as reported by her intake nurse. Small wound at the base of the left second toe 12/02/16; patient's right leg is better and there is no open wound here. His left anterior lateral leg wound continues to have [name] healthy-looking surface. Small wound at the base of the left second toe however there is erythema in the left forefoot which is worrisome 12/16/16; is no open wounds on his right leg. We took measurements for stockings. His left anterior lateral leg wound continues to have [name] healthy-looking surface. I'm not sure where we were with the Apligraf run through his insurance. We have been using Iodoflex. He has [name] thick eschar on the left first second toe interface, I suspect this may be fungal however there is no visible open 12/23/16; no open wound on his right leg. He has 2 small areas left of the linear wound that was remaining last week. We have been using Prisma, I thought I have disclosed this week, we can only look forward to next week 01/03/17; the patient had concerning areas of erythema last week, already on doxycycline for UTI through his primary doctor. The erythema is absolutely no better there is warmth and swelling both medially from the left lateral leg wound and also the dorsal left foot. 01/06/17- Patient is here for follow-up evaluation of his left lateral leg ulcer and bilateral feet ulcers. He is on oral antibiotic therapy, tolerating that. Nursing staff and the patient states that the erythema is improved from Monday. 01/13/17; the predominant left lateral leg wound continues to be problematic. I had put  Apligraf on him earlier this month once. However he subsequently developed what appeared to be an intense cellulitis around the left lateral leg wound. I gave him Dalvance I think on 2/12 perhaps 2/13 he continues on cefdinir. The erythema is still present but the warmth and swelling is improved. I am hopeful that the cellulitis part of this control. I wouldn't be surprised if there is an element of venous inflammation as well. 01/17/17. The erythema is present but better in the left leg. His left lateral leg wound still does not have [name] viable surface buttons certain parts of this long thin wound it appears like there has been improvement in  dimensions. 01/20/17; the erythema still present but much better in the left leg. I'm thinking this is his usual degree of chronic venous inflammation. The wound on the left leg looks somewhat better. Is less surface slough 01/27/17; erythema is back to the chronic venous inflammation. The wound on the left leg is somewhat better. I am back to the point where I like to try an Apligraf once again 02/10/17; slight improvement in wound dimensions. Apligraf #2. He is completing his doxycycline 02/14/17; patient arrives today having completed doxycycline last Thursday. This was supposed to be [name] nurse visit however once again he hasn't tense erythema from the medial part of his wound extending over the lower leg. Also erythema in his foot this is roughly in the same distribution as last time. He has baseline chronic venous inflammation however this is [name] lot worse than the baseline I have learned to accept the on him is baseline inflammation Macgregor, Drew [name] ([id number]) [phone].pdf Page 22 of 37 02/24/17- patient is here for follow-up evaluation. He is tolerating compression therapy. His voicing no complaints or concerns he is here anticipating an Apligraf 03/03/17; he arrives today with an adherent necrotic surface. I don't think this is surface is going  to be amenable for Apligraf's. The erythema around his wound and on the left dorsal foot has resolved he is off antibiotics 03/10/17; better-looking surface today. I don't think he can tolerate Apligraf's. He tells me he had [name] wound VAC after [name] skin graft years ago to this area and they had difficulty with [name] seal. The erythema continues to be stable around this some degree of chronic venous inflammation but he also has recurrent cellulitis. We have been using Iodoflex 03/17/17; continued improvement in the surface and may be small changes in dimensions. Using Iodoflex which seems the only thing that will control his surface 03/24/17- He is here for follow up evaluation of his LLE lateral ulceration and ulcer to right dorsal foot/toe space. He is voicing no complaints or concerns, He is tolerating compression wrap. 03/31/17 arrives today with [name] much healthier looking wound on the left lower extremity. We have been using Iodoflex for [name] prolonged period of time which has for the first time prepared and adequate looking wound bed although we have not had much in the way of wound dimension improvement. He also has [name] small wound between the first and second toe on the right 04/07/17; arrives today with [name] healthy-looking wound bed and at least the top 50% of this wound appears to be now her. No debridement was required I have changed him to [facility] last week after prolonged Iodoflex. He did not do well with Apligraf's. We've had [name] re-opening between the first and second toe on the right 04/14/17; arrives today with [name] healthier looking wound bed contractions and the top 50% of this wound and some on the lesser 50%. Wound bed appears healthy. The area between the first and second toe on the right still remains problematic 04/21/17; continued very gradual improvement. Using [facility] 04/28/17; continued very gradual improvement in the left lateral leg venous insufficiency wound. His periwound erythema is  very mild. We have been using Hydrofera Blue. Wound is making progress especially in the superior 50% 05/05/17; he continues to have very gradual improvement in the left lateral venous insufficiency wound. Both in terms with an length rings are improving. I debrided this every 2 weeks with #5 curet and we have been using Hydrofera Blue and again making  good progress With regards to the wounds between his right first and second toe which I thought might of been tinea pedis he is not making as much progress very dry scaly skin over the area. Also the area at the base of the left first and second toe in [name] similar condition 05/12/17; continued gradual improvement in the refractory left lateral venous insufficiency wound on the left. Dimension smaller. Surface still requiring debridement using Hydrofera Blue 05/19/17; continued gradual improvement in the refractory left lateral venous ulceration. Careful inspection of the wound bed underlying rumination suggested some degree of epithelialization over the surface no debridement indicated. Continue Hydrofera Blue difficult areas between his toes first and third on the left than first and second on the right. I'm going to change to silver alginate from silver collagen. Continue ketoconazole as I suspect underlying tinea pedis 05/26/17; left lateral leg venous insufficiency wound. We've been using Hydrofera Blue. I believe that there is expanding epithelialization over the surface of the wound albeit not coming from the wound circumference. This is [name] bit of an odd situation in which the epithelialization seems to be coming from the surface of the wound rather than in the exact circumference. There is still small open areas mostly along the lateral margin of the wound. ooHe has unchanged areas between the left first and second and the right first second toes which I been treating for tenia pedis 06/02/17; left lateral leg venous insufficiency wound. We have been  using Hydrofera Blue. Somewhat smaller from the wound circumference. The surface of the wound remains [name] bit on it almost epithelialized sedation in appearance. I use an open curette today debridement in the surface of all of this especially the edges ooSmall open wounds remaining on the dorsal right first and second toe interspace and the plantar left first second toe and her face on the left 06/09/17; wound on the left lateral leg continues to be smaller but very gradual and very dry surface using Hydrofera Blue 06/16/17 requires weekly debridements now on the left lateral leg although this continues to contract. I changed to silver collagen last week because of dryness of the wound bed. Using Iodoflex to the areas on his first and second toes/web space bilaterally 06/24/17; patient with history of paraplegia also chronic venous insufficiency with lymphedema. Has [name] very difficult wound on the left lateral leg. This has been gradually reducing in terms of with but comes in with [name] very dry adherent surface. High switch to silver collagen [name] week or so ago with hydrogel to keep the area moist. This is been refractory to multiple dressing attempts. He also has areas in his first and second toes bilaterally in the anterior and posterior web space. I had been using Iodoflex here after [name] prolonged course of silver alginate with ketoconazole was ineffective [question tinea pedis] 07/14/17; patient arrives today with [name] very difficult adherent material over his left lateral lower leg wound. He also has surrounding erythema and poorly controlled edema. He was switched his Santyl last visit which the nurses are applying once during his doctor visit and once on [name] nurse visit. He was also reduced to 2 layer compression I'm not exactly sure of the issue here. 07/21/17; better surface today after 1 week of Iodoflex. Significant cellulitis that we treated last week also better. [Doxycycline] 07/28/17 better surface today  with now 2 weeks of Iodoflex. Significant cellulitis treated with doxycycline. He has now completed the doxycycline and he is back to his usual  degree of chronic venous inflammation/stasis dermatitis. He reminds me he has had ablations surgery here 08/04/17; continued improvement with Iodoflex to the left lateral leg wound in terms of the surface of the wound although the dimensions are better. 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 Problematic areas on the plantar aspect of the first second toe web space on the left and the dorsal aspect of the first second toe web space on the right. At one point I felt these were probably related to chronic fungal infections in treated him aggressively for this although we have not made any improvement here. 08/11/17; left lateral leg. Surface continues to improve with the Iodoflex although we are not seeing much improvement in overall wound dimensions. Areas on his plantar left foot and right foot show no improvement. In fact the right foot looks somewhat worse 08/18/17; left lateral leg. We changed to [facility] Blue last week after [name] prolonged course of Iodoflex which helps get the surface better. It appears that the wound with is improved. Continue with difficult areas on the left dorsal first second and plantar first second on the right 09/01/17; patient arrives in clinic today having had [name] temperature of 103 yesterday. He was seen in the ER and [facility]. The patient was concerned he could have cellulitis again in the right leg however they diagnosed him with [name] UTI and he is now on Keflex. He has [name] history of cellulitis which is been recurrent and difficult but this is been in the left leg, in the past 5 use doxycycline. He does in and out catheterizations at home which are risk factors for UTI 09/08/17; patient will be completing his Keflex this weekend. The erythema on the left leg is  considerably better. He has [name] new wound today on the medial part of the right leg small superficial almost looks like [name] skin tear. He has worsening of the area on the right dorsal first and second toe. His major area on the left lateral leg is better. Using Hydrofera Blue on all areas 09/15/17; gradual reduction in width on the long wound in the left lateral leg. No debridement required. He also has wounds on the plantar aspect of his left first second toe web space and on the dorsal aspect of the right first second toe web space. 09/22/17; there continues to be very gradual improvements in the dimensions of the left lateral leg wound. He hasn't round erythematous spot with might be pressure on his wheelchair. There is no evidence obviously of infection no purulence no warmth ooHe has [name] dry scaled area on the plantar aspect of the left first second toe ooImproved area on the dorsal right first second toe. 09/29/17; left lateral leg wound continues to improve in dimensions mostly with an is still [name] fairly long but increasingly narrow wound. ooHe has [name] dry scaled area on the plantar aspect of his left first second toe web space ooIncreasingly concerning area on the dorsal right first second toe. In fact I am concerned today about possible cellulitis around this wound. The areas extending up his second toe and although there is deformities here almost appears to abut on the nailbed. 10/06/17; left lateral leg wound continues to make very gradual progress. Tissue culture I did from the right first second toe dorsal foot last time grew MRSA and enterococcus which was vancomycin sensitive. This was not sensitive to clindamycin or doxycycline. He is allergic to Zyvox  and sulfa we have therefore arrange for him to have dalvance infusion tomorrow. He is had this in the past and tolerated it well 10/20/17; left lateral leg wound continues to make decent progress. This is certainly reduced in terms of with  there is advancing epithelialization.ooThe cellulitis in the right foot looks better although he still has [name] deep wound in the dorsal aspect of the first second toe web space. Plantar left first toe web space on the left I think is making some progress 10/27/17; left lateral leg wound continues to make decent progress. Advancing epithelialization.using Hydrofera Blue ooThe right first second toe web space wound is better-looking using silver alginate ooImprovement in the left plantar first second toe web space. Again using silver alginate 11/03/17 left lateral leg wound continues to make decent progress albeit slowly. Using Hydrofera Blue ooThe right per second toe web space continues to be [name] very problematic looking punched out wound. I obtained [name] piece of tissue for deep culture I did extensively treated this for fungus. It is difficult to imagine that this is [name] pressure area as the patient states other than going outside he doesn't really wear shoes at home ooThe left plantar first second toe web space looked fairly senescent. Necrotic edges. This required debridement oochange to [facility] [facility] to all wound areas Toole, Caroll [name] ([id number]) [phone].pdf Page 23 of 37 11/10/17; left lateral leg wound continues to contract. Using Hydrofera Blue ooOn the right dorsal first second toe web space dorsally. Culture I did of this area last week grew MRSA there is not an easy oral option in this patient was multiple antibiotic allergies or intolerances. This was only [name] rare culture isolate I'm therefore going to use Bactroban under silver alginate ooOn the left plantar first second toe web space. Debridement is required here. This is also unchanged 11/17/17; left lateral leg wound continues to contract using Hydrofera Blue this is no longer the major issue. ooThe major concern here is the right first second toe web space. He now has an open area going from dorsally to the  plantar aspect. There is now wound on the inner lateral part of the first toe. Not [name] very viable surface on this. There is erythema spreading medially into the forefoot. ooNo major change in the left first second toe plantar wound 11/24/17; left lateral leg wound continues to contract using Hydrofera Blue. Nice improvement today ooThe right first second toe web space all of this looks [name] lot less angry than last week. I have given him clindamycin and topical Bactroban for MRSA and terbinafine for the possibility of underlining tinea pedis that I could not control with ketoconazole. Looks somewhat better ooThe area on the plantar left first second toe web space is weeping with dried debris around the wound 12/01/17; left lateral leg wound continues to contract he Hydrofera Blue. It is becoming thinner in terms of with nevertheless it is making good improvement. ooThe right first second toe web space looks less angry but still [name] large necrotic-looking wounds starting on the plantar aspect of the right foot extending between the toes and now extensively on the base of the right second toe. I gave him clindamycin and topical Bactroban for MRSA anterior benefiting for the possibility of underlying tinea pedis. Not looking better today ooThe area on the left first/second toe looks better. Debrided of necrotic debris 12/05/17* the patient was worked in urgently today because over the weekend he found blood on his incontinence bad when he woke  up. He was found to have an ulcer by his wife who does most of his wound care. He came in today for [place] to look at this. He has not had [name] history of wounds in his buttocks in spite of his paraplegia. 12/08/17; seen in follow-up today at his usual appointment. He was seen earlier this week and found to have [name] new wound on his buttock. We also follow him for wounds on the left lateral leg, left first second toe web space and right first second toe web space 12/15/17; we  have been using Hydrofera Blue to the left lateral leg which has improved. The right first second toe web space has also improved. Left first second toe web space plantar aspect looks stable. The left buttock has worsened using Santyl. Apparently the buttock has drainage 12/22/17; we have been using Hydrofera Blue to the left lateral leg which continues to improve now 2 small wounds separated by normal skin. He tells [place] he had [name] fever up to 100 yesterday he is prone to UTIs but has not noted anything different. He does in and out catheterizations. The area between the first and second toes today does not look good necrotic surface covered with what looks to be purulent drainage and erythema extending into the third toe. I had gotten this to something that I thought look better last time however it is not look good today. He also has [name] necrotic surface over the buttock wound which is expanded. I thought there might be infection under here so I removed [name] lot of the surface with [name] #5 curet though nothing look like it really needed culturing. He is been using Santyl to this area 12/27/17; his original wound on the left lateral leg continues to improve using Hydrofera Blue. I gave him samples of Baxdella although he was unable to take them out of fear for an allergic reaction ["lump in his throat"].the culture I did of the purulent drainage from his second toe last week showed both enterococcus and [name] set Enterobacter I was also concerned about the erythema on the bottom of his foot although paradoxically although this looks somewhat better today. 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 We've been using silver alginate here. They came up today that he sleeps in [name] recliner, I'm not sure why but I asked him to stop this 01/03/18; his original wound we've been using Hydrofera Blue is now separated into 2 areas. ooUlcer  on his left buttock is better he is off the recliner and sleeping in bed ooFinally both wound areas between his first and second toes also looks some better 01/10/18; his original wound on the left lateral leg is now separated into 2 wounds we've been using Hydrofera Blue ooUlcer on his left buttock has some drainage. There is [name] small probing site going into muscle layer superiorly.using silver alginate -He arrives today with [name] deep tissue injury on the left heel ooThe wound on the dorsal aspect of his first second toe on the left looks [name] lot betterusing silver alginate ketoconazole ooThe area on the first second toe web space on the right also looks [name] lot bette 01/17/18; his original wound on the left lateral leg continues to progress using Hydrofera Blue ooUlcer on his left buttock also is smaller surface healthier except for [name] small probing site going into the muscle layer superiorly. 2.4 cm of tunneling in  this area ooDTI on his left heel we have only been offloading. Looks better than last week no threatened open no evidence of infection oothe wound on the dorsal aspect of the first second toe on the left continues to look like it's regressing we have only been using silver alginate and terbinafine orally ooThe area in the first second toe web space on the right also looks to be [name] lot better using silver alginate and terbinafine I think this was prompted by tinea pedis 01/31/18; the patient was hospitalized in  last week apparently for [name] complicated UTI. He was discharged on cefepime he does in and out catheterizations. In the hospital he was discovered M I don't mild elevation of AST and ALT and the terbinafine was stopped.predictably the pressure ulcer on s his buttock looks betterusing silver alginate. The area on the left lateral leg also is better using Hydrofera Blue. The area between the first and second toes on the left better. First and second toes on the right still  substantial but better. Finally the DTI on the left heel has held together and looks like it's resolving 02/07/18-he is here in follow-up evaluation for multiple ulcerations. He has new injury to the lateral aspect of the last issue [name] pressure ulcer, he states this is from adhesive removal trauma. He states he has tried multiple adhesive products with no success. All other ulcers appear stable. The left heel DTI is resolving. We will continue with same treatment plan and follow-up next week. 02/14/18; follow-up for multiple areas. ooHe has [name] new area last week on the lateral aspect of his pressure ulcer more over the posterior trochanter. The original pressure ulcer looks quite stable has healthy granulation. 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 Almost fully epithelialized on the original superior area using Hydrofera Blue ooDTI on the left heel has peeled off this week to reveal [name] small superficial wound under denuded skin and subcutaneous tissue ooBoth areas between the first and second toes look better including nothing open on the left 02/21/18; ooThe patient's wounds on his left ischial tuberosity and posterior left greater trochanter actually looked better. He has [name] large area of irritation around the area which I think is contact dermatitis. I am doubtful that this is fungal ooHis original wound on the left lateral calf continues to improve we have been using Hydrofera Blue ooThere is no open area in the left first second toe web space although there is [name] lot of thick callus ooThe DTI on the left heel required debridement today of necrotic surface eschar and subcutaneous tissue using silver alginate ooFinally the area on the right first second toe webspace continues to contract using silver alginate and ketoconazole 02/28/18 ooLeft ischial tuberosity wounds look better using  silver alginate. ooOriginal wound on the left calf only has one small open area left using Hydrofera Blue ooDTI on the left heel required debridement mostly removing skin from around this wound surface. Using silver alginate ooThe areas on the right first/second toe web space using silver alginate and ketoconazole 03/08/18 on evaluation today patient appears to be doing decently well as best I can tell in regard to his wounds. This is the first time that I have seen him as he generally is followed by Dr. Leanord Hawking. With that being said none of his wounds appear to be infected he does have an area where there is some  skin covering what appears to be [name] new wound on the left dorsal surface of his great toe. This is right at the nail bed. With that being said I do believe that debrided away some of the excess skin can be of benefit in this regard. Otherwise he has been tolerating the dressing changes without complication. 03/14/18; patient arrives today with the multiplicity of wounds that we are following. He has not been systemically unwell ooOriginal wound on the left lateral calf now only has 2 small open areas we've been using Hydrofera Blue which should continue ooThe deep tissue injury on the left heel requires debridement today. We've been using silver alginate ooThe left first second toe and the right first second toe are both are reminiscence what I think was tinea pedis. Apparently some of the callus Surface between Chandra, Equan [name] ([id number]) [phone].pdf Page 24 of 37 the toes was removed last week when it started draining. ooPurulent drainage coming from the wound on the ischial tuberosity on the left. 03/21/18-He is here in follow-up evaluation for multiple wounds. 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 He tolerated debridement. The only change to last week's recommendations is to  discontinue antifungal cream between toes. He will follow-up next week 03/28/18; following up for multiple wounds;Concern this week is streaking redness and swelling in the right foot. He is going to need antibiotics for this. 03/31/18; follow-up for right foot cellulitis. Streaking redness and swelling in the right foot on 03/28/18. 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 I put him on clindamycin 300 mg every 6 and brought him in for [name] quick check. He has an open wound between his first and second toes on the right foot as [name] potential source. 04/04/18; ooRight foot cellulitis is resolving he is completing clindamycin. This is truly good news ooLeft lateral calf wound which is initial wound only has one small open area inferiorly this is close to healing out. He has compression stockings. We will use Hydrofera Blue right down to the epithelialization of this ooNonviable surface on the left heel which was initially pressure with [name] DTI. We've been using Hydrofera Blue. I'm going to switch this back to silver alginate ooLeft first second toe/tinea pedis this looks better using silver alginate ooRight first second toe tinea pedis using silver alginate ooLarge pressure ulcers on theLeft ischial tuberosity. Small wound here Looks better. I am uncertain about the surface over the large wound. Using silver alginate 04/11/18; ooCellulitis in the right foot is resolved ooLeft lateral calf wound which was his original wounds still has 2 tiny open areas remaining this is just about closed ooNonviable surface on the left heel is better but still requires debridement ooLeft first second toe/tinea pedis still open using silver alginate ooRight first second toe wound tinea pedis I asked him to go back to using ketoconazole and silver alginate ooLarge pressure ulcers on the left ischial tuberosity this shear injury here is resolved. Wound is smaller. No evidence of infection using  silver alginate 04/18/18; ooPatient arrives with an intense area of cellulitis in the right mid lower calf extending into the right heel area. Bright red and warm. Smaller area on the left anterior leg. He has [name] significant history of MRSA. He will definitely need antibioticsoodoxycycline ooHe now has 2 open areas on the left ischial tuberosity the original large wound and now [name] satellite area which I think was above his initial satellite  areas. Not [name] wonderful surface on this satellite area surrounding erythema which looks like pressure related. ooHis left lateral calf wound again his original wound is just about closed ooLeft heel pressure injury still requiring debridement ooLeft first second toe looks [name] lot better using silver alginate ooRight first second toe also using silver alginate and ketoconazole cream also looks better 04/20/18; the patient was worked in early today out of concerns with his cellulitis on the right leg. I had started him on doxycycline. This was 2 days ago. His wife was concerned about the swelling in the area. Also concerned about the left buttock. He has not been systemically unwell no fever chills. No nausea vomiting or diarrhea 04/25/18; the patient's left buttock wound is continued to deteriorate he is using Hydrofera Blue. He is still completing clindamycin for the cellulitis on the right leg although all of this looks better. 05/02/18 ooLeft buttock wound still with [name] lot of drainage and [name] very tightly adherent fibrinous necrotic surface. He has [name] deeper area superiorly ooThe left lateral calf wound is still closed ooDTI wound on the left heel necrotic surface especially the circumference using Iodoflex ooAreas between his left first second toe and right first second toe both look better. Dorsally and the right first second toe he had [name] necrotic surface although at smaller. In using silver alginate and ketoconazole. I did [name] culture last week which was [name]  deep tissue culture of the reminiscence of the open wound on the right first second toe dorsally. This grew [name] few Acinetobacter and [name] few methicillin-resistant staph aureus. Nevertheless the area actually this week looked better. I didn't feel the need to specifically address this at least in terms of systemic antibiotics. 05/09/18; wounds are measuring larger more drainage per our intake. We are using Santyl covered with alginate on the large superficial buttock wounds, Iodosorb on the left heel, ketoconazole and silver alginate to the dorsal first and second toes bilaterally. 05/16/18; ooThe area on his left buttock better in some aspects although the area superiorly over the ischial tuberosity required an extensive debridement.using Santyl ooLeft heel appears stable. Using Iodoflex ooThe areas between his first and second toes are not bad however there is spreading erythema up the dorsal aspect of his left foot this looks like cellulitis again. He is insensate the erythema is really very brilliant.o Erysipelas He went to see an allergist days ago because he was itching part of this he had lab work done. This showed [name] white count of 15.1 with 70% neutrophils. Hemoglobin of 11.4 and [name] platelet count of 659,000. Last white count we had in Epic was [name] 2-1/2 years ago which was 25.9 but he was ill at the time. He was able to show me some lab work that was done by his primary physician the pattern is about the same. 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 But prompted me to go ahead and do x-rays of both feet and the pelvis rule out osteomyelitis. He also had [name] comprehensive metabolic panel this was reasonably normal his albumin was 3.7 liver function tests BUN/creatinine all normal 05/23/18; x-rays of both his feet from last week were negative for underlying pulmonary abnormality. The x-ray of his pelvis however showed mild irregularity in the left ischial  which may represent some early osteomyelitis. The wound in the left ischial continues to get deeper clearly now exposed muscle. Each week necrotic surface material over this area.  Whereas the rest of the wounds do not look so bad. ooThe left ischial wound we have been using Santyl and calcium alginate ooT the left heel surface necrotic debris using Iodoflex o ooThe left lateral leg is still healed ooAreas on the left dorsal foot and the right dorsal foot are about the same. There is some inflammation on the left which might represent contact dermatitis, fungal dermatitis I am doubtful cellulitis although this looks better than last week 05/30/18; CT scan done at Hospital did not show any osteomyelitis or abscess. Suggested the possibility of underlying cellulitis although I don't see [name] lot of evidence of this at the bedside ooThe wound itself on the left buttock/upper thigh actually looks somewhat better. No debridement ooLeft heel also looks better no debridement continue Iodoflex ooBoth dorsal first second toe spaces appear better using Lotrisone. Left still required debridement 06/06/18; ooIntake reported some purulent looking drainage from the left gluteal wound. Using Santyl and calcium alginate ooLeft heel looks better although still [name] nonviable surface requiring debridement ooThe left dorsal foot first/second webspace actually expanding and somewhat deeper. I may consider doing [name] shave biopsy of this area ooRight dorsal foot first/second webspace appears stable to improved. Using Lotrisone and silver alginate to both these areas 06/13/18 ooLeft gluteal surface looks better. Now separated in the 2 wounds. No debridement required. Still drainage. We'll continue silver alginate ooLeft heel continues to look better with Iodoflex continue this for at least another week ooOf his dorsal foot wounds the area on the left still has some depth although it looks better than last week. We've  been using Lotrisone and silver alginate 06/20/18 ooLeft gluteal continues to look better healthy tissue ooLeft heel continues to look better healthy granulation wound is smaller. He is using Iodoflex and his long as this continues continue the Iodoflex ooDorsal right foot looks better unfortunately dorsal left foot does not. There is swelling and erythema of his forefoot. He had minor trauma to this several days ago but doesn't think this was enough to have caused any tissue injury. Foot looks like cellulitis, we have had this problem before 06/27/18 on evaluation today patient appears to be doing [name] little worse in regard to his foot ulcer. 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 Both of which would really be his only Cleaver, Ronith [name] ([id number]) [phone].pdf Page 25 of 37 options for treating this infection. In the past he has been given and effusion of Orbactiv. This is done very well for him in the past again it's one time dosing IV antibiotic therapy. Subsequently I do believe this is something we're gonna need to see about doing at this point in time. Currently his other wounds seem to be doing somewhat better in my pinion I'm pretty happy in that regard. 07/03/18 on evaluation today patient's wounds actually appear to be doing fairly well. He has been tolerating the dressing changes without complication. All in all he seems to be showing signs of improvement. In regard to the antibiotics he has been dealing with infectious disease since I saw him last week as far as getting this scheduled. In the end [facility]'s [facility] this coming Friday. In the meantime he has been continuing to perform the dressing changes in such as previous. There does not appear  to be any evidence of  infection worsengin at this time. 07/10/18; ooSince I last saw this man 2 weeks ago things have actually improved. IV antibiotics of resulted in less forefoot erythema although there is still some present. He is not systemically unwell ooLeft buttock wounds o2 now have no depth there is increased epithelialization Using silver alginate ooLeft heel still requires debridement using Iodoflex ooLeft dorsal foot still with [name] sizable wound about the size of [name] border but healthy granulation ooRight dorsal foot still with [name] slitlike area using silver alginate 07/18/18; the patient's cellulitis in the left foot is improved in fact I think it is on its way to resolving. ooLeft buttock wounds o2 both look better although the larger one has hypertension granulation we've been using silver alginate ooLeft heel has some thick circumferential redundant skin over the wound edge which will need to be removed today we've been using Iodoflex ooLeft dorsal foot is still [name] sizable wound required debridement using silver alginate ooThe right dorsal foot is just about closed only [name] small open area remains here 07/25/18; left foot cellulitis is resolved ooLeft buttock wounds o2 both look better. Hyper-granulation on the major area ooLeft heel as some debris over the surface but otherwise looks [name] healthier wound. Using silver collagen ooRight dorsal foot is just about closed 07/31/18; arrives with our intake nurse worried about purulent drainage from the buttock. We had hyper-granulation here last week ooHis buttock wounds o2 continue to look better ooLeft heel some debris over the surface but measuring smaller. ooRight dorsal foot unfortunately has openings between the toes ooLeft foot superficial wound looks less aggravated. 08/07/18 ooButtock wounds continue to look better although some of her granulation and the larger medial wound. silver alginate ooLeft heel continues to look [name] lot better.silver  collagen ooLeft foot superficial wound looks less stable. Requires debridement. He has [name] new wound superficial area on the foot on the lateral dorsal foot. ooRight foot looks better using silver alginate without Lotrisone 08/14/2018; patient was in the ER last week diagnosed with [name] UTI. He is now on Cefpodoxime and Macrodantin. ooButtock wounds continued to be smaller. Using silver alginate ooLeft heel continues to look better using silver collagen ooLeft foot superficial wound looks as though it is improving ooRight dorsal foot area is just about healed. 08/21/2018; patient is completed his antibiotics for his UTI. ooHe has 2 open areas on the buttocks. There is still not closed although the surface looks satisfactory. Using silver alginate ooLeft heel continues to improve using silver collagen ooThe bilateral dorsal foot areas which are at the base of his first and second toes/possible tinea pedis are actually stable on the left but worse on the right. The area on the left required debridement of necrotic surface. After debridement I obtained [name] specimen for PCR culture. ooThe right dorsal foot which is been just about healed last week is now reopened 08/28/2018; culture done on the left dorsal foot showed coag negative staph both staph epidermidis and Lugdunensis. I think this is worthwhile initiating systemic treatment. I will use doxycycline given his long list of allergies. The area on the left heel slightly improved but still requiring debridement. ooThe large wound on the buttock is just about closed whereas the smaller one is larger. Using silver alginate in this area 09/04/2018; patient is completing his doxycycline for the left foot although this continues to be [name] very difficult wound area with very adherent necrotic debris. We are using silver alginate to all his  wounds right foot left foot and the small wounds on his buttock, silver collagen on the left heel. 09/11/2018; once  again this patient has intense erythema and swelling of the left forefoot. Lesser degrees of erythema in the right foot. He has [name] long list of allergies and intolerances. I will reinstitute doxycycline. 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 Using silver alginate ooLeft heel also looks better using silver collagen ooUnfortunately both the areas on his feet look worse. The area on the left first second webspace is now gone through to the plantar part of his foot. The area on the left foot anteriorly is irritated with erythema and swelling in the forefoot. 09/25/2018 ooHis wound on the left plantar heel looks better. Using silver collagen ooThe area on the left buttock 2 small remnant areas. One is closed one is still open. Using silver alginate ooThe areas between both his first and second toes look worse. This in spite of long-standing antifungal therapy with ketoconazole and silver alginate which should have antifungal activity ooHe has small areas around his original wound on the left calf one is on the bottom of the original scar tissue and one superiorly both of these are small and superficial but again given wound history in this site this is worrisome 10/02/2018 ooLeft plantar heel continues to gradually contract using silver collagen ooLeft buttock wound is unchanged using silver alginate ooThe areas on his dorsal feet between his first and second toes bilaterally look about the same. 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 Small open area here. We have been using silver alginate 10/09/2018; ooLeft plantar heel continues to look healthy and contract. No debridement is required ooLeft buttock slightly smaller with [name] tape injury wound just below  which was new this week ooDorsal feet somewhat improved I have been using clindamycin ooLeft lateral looks lower extremity the actual open area looks worse although [name] lot of this is epithelialized. I am going to change to silver collagen today He has [name] lot more swelling in the right leg although this is not pitting not red and not particularly warm there is [name] lot of spasm in the right leg usually indicative of people with paralysis of some underlying discomfort. We have reviewed his vascular status from 2017 he had [name] left greater saphenous vein ablation. I wonder about referring him back to vascular surgery if the area on the left leg continues to deteriorate. 10/16/2018 in today for follow-up and management of multiple lower extremity ulcers. His left Buttock wound is much lower smaller and almost closed completely. The wound to the left ankle has began to reopen with Epithelialization and some adherent slough. He has multiple new areas to the left foot and leg. The left dorsal foot without much improvement. Wound present between left great webspace and 2nd toe. Erythema and edema present right leg. Right LE ultrasound obtained on 10/10/18 was negative for DVT . 10/23/2018; ooLeft buttock is closed over. Still dry macerated skin but there is no open wound. I suspect this is chronic pressure/moisture ooLeft lateral calf is quite [name] bit worse than when I saw this last. There is clearly drainage here he has macerated skin into the left plantar heel. We will change the primary dressing to alginate [place], Samaad [name] (  [id number]) [id number] Page 26 of 37 ooLeft dorsal foot has some improvement in overall wound area. Still using clindamycin and silver alginate ooRight dorsal foot about the same as the left using clindamycin and silver alginate ooThe erythema in the right leg has resolved. He is DVT rule out was negative ooLeft heel pressure area required debridement  although the wound is smaller and the surface is health 10/26/2018 ooThe patient came back in for his nurse check today predominantly because of the drainage coming out of the left lateral leg with [name] recent reopening of his original wound on the left lateral calf. He comes in today with [name] large amount of surrounding erythema around the wound extending from the calf into the ankle and even in the area on the dorsal foot. He is not systemically unwell. He is not febrile. Nevertheless this looks like cellulitis. We have been using silver alginate to the area. I changed him to [name] regular visit and I am going to prescribe him doxycycline. The rationale here is [name] long list of medication intolerances and [name] history of MRSA. I did not see anything that I thought would provide [name] valuable culture 10/30/2018 ooFollow-up from his appointment 4 days ago with really an extensive area of cellulitis in the left calf left lateral ankle and left dorsal foot. I put him on doxycycline. He has [name] long list of medication allergies which are true allergy reactions. 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 In any case he is [name] lot better today. The erythema especially in the anterior and lateral left calf is better. He still has left ankle erythema. He also is complaining about increasing edema in the right leg we have only been using Kerlix Coban and he has been doing the wraps at home. Finally he has [name] spotty rash on the medial part of his upper left calf which looks like folliculitis or perhaps wrap occlusion type injury. Small superficial macules not pustules 11/06/18 patient arrives today with again [name] considerable degree of erythema around the wound on the left lateral calf extending into the dorsal ankle and dorsal foot. This is [name] lot worse than when I saw this last week. He is on doxycycline really with not [name] lot of improvement. He has not been  systemically unwell Wounds on the; left heel actually looks improved. Original area on the left foot and proximity to the first and second toes looks about the same. He has superficial areas on the dorsal foot, anterior calf and then the reopening of his original wound on the left lateral calf which looks about the same ooThe only area he has on the right is the dorsal webspace first and second which is smaller. ooHe has [name] large area of dry erythematous skin on the left buttock small open area here. 11/13/2018; the patient arrives in much better condition. The erythema around the wound on the left lateral calf is [name] lot better. Not sure whether this was the clindamycin or the TCA and ketoconazole or just in the improvement in edema control [stasis dermatitis]. In any case this is [name] lot better. The area on the left heel is very small and just about resolved using silver collagen we have been using silver alginate to the areas on his dorsal feet 11/20/2018; his wounds include the left lateral calf, left heel, dorsal aspects of both feet just proximal to the first second webspace. He is stable to slightly  improved. I did not think any changes to his dressings were going to be necessary 11/27/2018 he has [name] reopening on the left buttock which is surrounded by what looks like tinea or perhaps some other form of dermatitis. The area on the left dorsal foot has some erythema around it I have marked this area but I am not sure whether this is cellulitis or not. Left heel is not closed. Left calf the reopening is really slightly longer and probably worse 1/13; in general things look better and smaller except for the left dorsal foot. Area on the left heel is just about closed, left buttock looks better only [name] small wound remains in the skin looks better [using Lotrisone] 1/20; the area on the left heel only has [name] few remaining open areas here. Left lateral calf about the same in terms of size, left dorsal foot  slightly larger right lateral foot still not closed. The area on the left buttock has no open wound and the surrounding skin looks [name] lot better 1/27; the area on the left heel is closed. Left lateral calf better but still requiring extensive debridements. The area on his left buttock is closed. He still has the open areas on the left dorsal foot which is slightly smaller in the right foot which is slightly expanded. We have been using Iodoflex on these areas as well 2/3; left heel is closed. Left lateral calf still requiring debridement using Iodoflex there is no open area on his left buttock however he has dry scaly skin over [name] large area of this. Not really responding well to the Lotrisone. Finally the areas on his dorsal feet at the level of the first second webspace are slightly smaller on the right and about the same on the left. Both of these vigorously debrided with Anasept and gauze 2/10; left heel remains closed he has dry erythematous skin over the left buttock but there is no open wound here. Left lateral leg has come in and with. Still requiring debridement we have been using Iodoflex here. Finally the area on the left dorsal foot and right dorsal foot are really about the same extremely dry callused fissured areas. He does not yet have [name] dermatology appointment 2/17; left heel remains closed. He has [name] new open area on the left buttock. The area on the left lateral calf is bigger longer and still covered in necrotic debris. No major change in his foot areas bilaterally. I am awaiting for [name] dermatologist to look on this. We have been using ketoconazole I do not know that this is been doing any good at all. 2/24; left heel remains closed. The left buttock wound that was new reopening last week looks better. The left lateral calf appears better also although still requires debridement. The major area on his foot is the left first second also requiring debridement. We have been putting Prisma  on all wounds. I do not believe that the ketoconazole has done too much good for his feet. He will use Lotrisone I am going to give him [name] 2-week course of terbinafine. We still do not have [name] dermatology appointment 3/2 left heel remains closed however there is skin over bone in this area I pointed this out to him today. The left buttock wound is epithelialized but still does not look completely stable. The area on the left leg required debridement were using silver collagen here. With regards to his feet we changed to Lotrisone last week and silver alginate. 3/9;  left heel remains closed. Left buttock remains closed. The area on the right foot is essentially closed. The left foot remains unchanged. Slightly smaller on the left lateral calf. Using silver collagen to both of these areas 3/16-Left heel remains closed. Area on right foot is closed. Left lateral calf above the lateral malleolus open wound requiring debridement with easy bleeding. Left dorsal wound proximal to first toe also debrided. Left ischial area open new. Patient has been using Prisma with wrapping every 3 days. Dermatology appointment is apparently tomorrow.Patient has completed his terbinafine 2-week course with some apparent improvement according to him, there is still flaking and dry skin in his foot on the left 3/23; area on the right foot is reopened. The area on the left anterior foot is about the same still [name] very necrotic adherent surface. He still has the area on the left leg and reopening is on the left buttock. He apparently saw dermatology although I do not have [name] note. According to the patient who is usually fairly well informed they did not have any good ideas. Put him on oral terbinafine which she is been on before. 3/30; using silver collagen to all wounds. Apparently his dermatologist put him on doxycycline and rifampin presumably some culture grew staph. I do not have this result. He remains on terbinafine  although I have used terbinafine on him before 4/6; patient has had [name] fairly substantial reopening on the right foot between the first and second toes. He is finished his terbinafine and I believe is on doxycycline and rifampin still as prescribed by dermatology. We have been using silver collagen to all his wounds although the patient reports that he thinks silver alginate does better on the wounds on his buttock. 4/13; the area on his left lateral calf about the same size but it did not require debridement. ooLeft dorsal foot just proximal to the webspace between the first and second toes is about the same. Still nonviable surface. I note some superficial bronze discoloration of the dorsal part of his foot ooRight dorsal foot just proximal to the first and second toes also looks about the same. I still think there may be the same discoloration I noted above on the left ooLeft buttock wound looks about the same 4/20; left lateral calf appears to be gradually contracting using silver collagen. 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 The left dorsal foot wound is debrided of tightly adherent necrotic debris and really cleans up quite nicely. The right area is worse with expansion. I did not debride this it is now over the base of the second toe ooThe area on his left buttock is smaller no debridement is required using silver collagen 5/4; left calf continues to make good progress. ooHe arrives with erythema around the wounds on his dorsal foot which even extends to the plantar aspect. Very concerning for coexistent infection. He is finished the erythromycin I gave him for possible erythrasma this does not seem to have helped. ooThe area on the left foot is about the same base of the dorsal toes ooIs area on the buttock looks improved on the left 5/11; left calf and left buttock continued to make good progress. Left foot is about  the same to slightly improved. ooMajor problem is on the right foot. He has not had an x-ray. Deep tissue culture I did last week showed both Enterobacter and [name]. coli. I did not change the doxycycline I put him on  empirically although neither 1 of these were plated to doxycycline. He arrives today with the erythema looking worse on both the dorsal and plantar foot. Macerated skin on the bottom of the foot. he has not been systemically unwell Holmes, Delfin [name] ([id number]) [phone].pdf Page 27 of 37 5/18-Patient returns at 1 week, left calf wound appears to be making some progress, left buttock wound appears slightly worse than last time, left foot wound looks slightly better, right foot redness is marginally better. X-ray of both feet show no air or evidence of osteomyelitis. Patient is finished his Omnicef and terbinafine. He continues to have macerated skin on the bottom of the left foot as well as right 5/26; left calf wound is better, left buttock wound appears to have multiple small superficial open areas with surrounding macerated skin. X-rays that I did last time showed no evidence of osteomyelitis in either foot. He is finished cefdinir and doxycycline. I do not think that he was on terbinafine. He continues to have [name] large superficial open area on the right foot anterior dorsal and slightly between the first and second toes. I did send him to dermatology 2 months ago or so wondering about whether they would do [name] fungal scraping. I do not believe they did but did do [name] culture. We have been using silver alginate to the toe areas, he has been using antifungals at home topically either ketoconazole or Lotrisone. We are using silver collagen on the left foot, silver alginate on the right, silver collagen on the left lateral leg and silver alginate on the left buttock 6/1; left buttock area is healed. We have the left dorsal foot, left lateral leg and right dorsal foot. We are  using silver alginate to the areas on both feet and silver collagen to the area on his left lateral calf 6/8; the left buttock apparently reopened late last week. He is not really sure how this happened. He is tolerating the terbinafine. Using silver alginate to all wounds 6/15; left buttock wound is larger than last week but still superficial. ooCame in the clinic today with [name] report of purulence from the left lateral leg I did not identify any infection ooBoth areas on his dorsal feet appear to be better. He is tolerating the terbinafine. Using silver alginate to all wounds 6/22; left buttock is about the same this week, left calf quite [name] bit better. His left foot is about the same however he comes in with erythema and warmth in the right forefoot once again. Culture that I gave him in the beginning of May showed Enterobacter and [name]. coli. I gave him doxycycline and things seem to improve although neither 1 of these organisms was specifically plated. 6/29; left buttock is larger and dry this week. Left lateral calf looks to me to be improved. Left dorsal foot also somewhat improved right foot completely unchanged. The erythema on the right foot is still present. He is completing the Ceftin dinner that I gave him empirically [see discussion above.) 7/6 - All wounds look to be stable and perhaps improved, the left buttock wound is slightly smaller, per patient bleeds easily, completed ceftin, the right foot redness is less, he is on terbinafine 7/13; left buttock wound about the same perhaps slightly narrower. Area on the left lateral leg continues to narrow. Left dorsal foot slightly smaller right foot about the same. We are using silver alginate on the right foot and Hydrofera Blue to the areas on the left. Unna boot on  the left 2 layer compression on the right 7/20; left buttock wound absolutely the same. Area on lateral leg continues to get better. Left dorsal foot require debridement as did  the right no major change in the 7/27; left buttock wound the same size necrotic debris over the surface. The area on the lateral leg is closed once again. His left foot looks better right foot about the same although there is some involvement now of the posterior first second toe area. He is still on terbinafine which I have given him for [name] month, not certain [name] centimeter major change 06/25/19-All wounds appear to be slightly improved according to report, left buttock wound looks clean, both foot wounds have minimal to no debris the right dorsal foot has minimal slough. We are using Hydrofera Blue to the left and silver alginate to the right foot and ischial wound. 8/10-Wounds all appear to be around the same, the right forefoot distal part has some redness which was not there before, however the wound looks clean and small. Ischial wound looks about the same with no changes 8/17; his wound on the left lateral calf which was his original chronic venous insufficiency wound remains closed. Since I last saw him the areas on the left dorsal foot right dorsal foot generally appear better but require debridement. The area on his left initial tuberosity appears somewhat larger to me perhaps hyper granulated and bleeds very easily. We have been using Hydrofera Blue to the left dorsal foot and silver alginate to everything else 8/24; left lateral calf remains closed. The areas on his dorsal feet on the webspace of the first and second toes bilaterally both look better. The area on the left buttock which is the pressure ulcer stage II slightly smaller. I change the dressing to Hydrofera Blue to all areas 8/31; left lateral calf remains closed. The area on his dorsal feet bilaterally look better. Using Hydrofera Blue. Still requiring debridement on the left foot. No change in the left buttock pressure ulcers however 9/14; left lateral calf remains closed. Dorsal feet look quite [name] bit better than 2 weeks ago.  Flaking dry skin also [name] lot better with the ammonium lactate I gave him 2 weeks ago. The area on the left buttock is improved. He states that his Roho cushion developed [name] leak and he is getting [name] new one, in the interim he is offloading this vigorously 9/21; left calf remains closed. Left heel which was [name] possible DTI looks better this week. He had macerated tissue around the left dorsal foot right foot looks satisfactory and improved left buttock wound. I changed his dressings to his feet to silver alginate bilaterally. Continuing Hydrofera Blue on the left buttock. 9/28 left calf remains closed. Left heel did not develop anything [possible DTI] dry flaking skin on the left dorsal foot. Right foot looks satisfactory. Improved left buttock wound. We are using silver alginate on his feet Hydrofera Blue on the buttock. I have asked him to go back to the Lotrisone on his feet including the wounds and surrounding areas 10/5; left calf remains closed. The areas on the left and right feet about the same. [name] lot of this is epithelialized however debris over the remaining open areas. He is using Lotrisone and silver alginate. The area on the left buttock using Hydrofera Blue 10/26. Patient has been out for 3 weeks secondary to Covid concerns. He tested negative but I think his wife tested positive. He comes in today with the left  foot substantially worse, right foot about the same. Even more concerning he states that the area on his left buttock closed over but then reopened and is considerably deeper in one aspect than it was before [stage III wound] 11/2; left foot really about the same as last week. Quarter sized wound on the dorsal foot just proximal to the first second toes. Surrounding erythema with areas of denuded epithelium. This is not really much different looking. Did not look like cellulitis this time however. ooRight foot area about the same.. We have been using silver alginate alginate on his  toes ooLeft buttock still substantial irritated skin around the wound which I think looks somewhat better. We have been using Hydrofera Blue here. 11/9; left foot larger than last week and [name] very necrotic surface. Right foot I think is about the same perhaps slightly smaller. Debris around the circumference also addressed. Unfortunately on the left buttock there is been [name] decline. Satellite lesions below the major wound distally and now [name] an additional one posteriorly we have been using Hydrofera Blue but I think this is [name] pressure issue 11/16; left foot ulcer dorsally again [name] very adherent necrotic surface. Right foot is about the same. Not much change in the pressure ulcer on his left buttock. 11/30; left foot ulcer dorsally basically the same as when I saw him 2 weeks ago. Very adherent fibrinous debris on the wound surface. Patient reports [name] lot of drainage as well. 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 We have been using Iodoflex, patient changed back to alginate because of the drainage. Area on his right dorsal foot really looks benign with [name] healthier surface certainly [name] lot better than on the left. Left buttock wounds all improved using Hydrofera Blue 12/7; left dorsal foot again no improvement. Tightly adherent debris. PCR culture I did last week only showed likely skin contaminant. I have gone ahead and done [name] punch biopsy of this which is about the last thing in terms of investigations I can think to do. He has known venous insufficiency and venous hypertension and this could be the issue here. The area on the right foot is about the same left buttock slightly worse according to our intake nurse secondary to [facility] Blue sticking to the wound 12/14; biopsy of the left foot that I did last time showed changes that could be related to wound healing/chronic stasis dermatitis phenomenon no neoplasm. We have been using silver alginate to  both feet. I change the one on the left today to Sorbact and silver alginate to his other 2 wounds 12/28; the patient arrives with the following problems; ooMajor issue is the dorsal left foot which continues to be [name] larger deeper wound area. Still with [name] completely nonviable surface ooParadoxically the area mirror image on the right on the right dorsal foot appears to be getting better. ooHe had some loss of dry denuded skin from the lower part of his original wound on the left lateral calf. Some of this area looked [name] little vulnerable and for this reason we put him in wrap that on this side this week ooThe area on his left buttock is larger. He still has the erythematous circular area which I think is [name] combination of pressure, sweat. This does not look like cellulitis or fungal dermatitis 11/26/2019; -Dorsal left foot large open wound with depth. Still debris over the surface. Using Sorbact ooThe area on the dorsal right foot paradoxically has  closed over [facility] has [name] reopening on the left ankle laterally at the base of his original wound that extended up into the calf. This appears clean. ooThe left buttock wound is smaller but with very adherent necrotic debris over the surface. We have been using silver alginate here as well The patient had arterial studies done in 2017. He had biphasic waveforms at the dorsalis pedis and posterior tibial bilaterally. ABI in the left was 1.17. Digit waveforms were dampened. He has slight spasticity in the great toes I do not think [name] TBI would be possible Kaylor, Loxley [name] ([id number]) [phone].pdf Page 28 of 37 1/11; the patient comes in today with [name] sizable reopening between the first and second toes on the right. This is not exactly in the same location where we have been treating wounds previously. According to our intake nurse this was actually fairly deep but 0.6 cm. The area on the left dorsal foot looks about the same the  surface is somewhat cleaner using Sorbact, his MRI is in 2 days. We have not managed yet to get arterial studies. The new reopening on the left lateral calf looks somewhat better using alginate. The left buttock wound is about the same using alginate 1/18; the patient had his ARTERIAL studies which were quite normal. ABI in the right at 1.13 with triphasic/biphasic waveforms on the left ABI 1.06 again with triphasic/biphasic waveforms. It would not have been possible to have done [name] toe brachial index because of spasticity. We have been using Sorbac to the left foot alginate to the rest of his wounds on the right foot left lateral calf and left buttock 1/25; arrives in clinic with erythema and swelling of the left forefoot worse over the first MTP area. This extends laterally dorsally and but also posteriorly. Still has an area on the left lateral part of the lower part of his calf wound it is eschared and clearly not closed. ooArea on the left buttock still with surrounding irritation and erythema. ooRight foot surface wound dorsally. The area between the right and first and second toes appears better. 2/1; ooThe left foot wound is about the same. Erythema slightly better I gave him [name] week of doxycycline empirically ooRight foot wound is more extensive extending between the toes to the plantar surface ooLeft lateral calf really no open surface on the inferior part of his original wound however the entire area still looks vulnerable ooAbsolutely no improvement in the left buttock wound required debridement. 2/8; the left foot is about the same. Erythema is slightly improved I gave him clindamycin last week. ooRight foot looks better he is using Lotrimin and silver alginate ooHe has [name] breakdown in the left lateral calf. Denuded epithelium which I have removed ooLeft buttock about the same were using Hydrofera Blue 2/15; left foot is about the same there is less surrounding erythema. Surface  still has tightly adherent debris which I have debriding however not making any progress ooRight foot has [name] substantial wound on the medial right second toe between the first and second webspace. ooStill an open area on the left lateral calf distal area. ooButtock wound is about the same 2/22; left foot is about the same less surrounding erythema. Surface has adherent debris. Polymen Ag Right foot area significant wound between the first and second toes. We have been using silver alginate here Left lateral leg polymen Ag at the base of his original venous insufficiency wound ooLeft buttock some improvement here 3/1; ooRight foot is deteriorating  in the first second toe webspace. Larger and more substantial. We have been using silver alginate. ooLeft dorsal foot about the same markedly adherent surface debris using PolyMem Ag ooLeft lateral calf surface debris using PolyMem AG ooLeft buttock is improved again using PolyMem Ag. ooHe is completing his terbinafine. The erythema in the foot seems better. He has been on this for 2 weeks 3/8; no improvement in any wound area in fact he has [name] small open area on the dorsal midfoot which is new this week. He has not gotten his foot x-rays yet 3/15; his x-rays were both negative for osteomyelitis of both feet. No major change in any of his wounds on the extremities however his buttock wounds are better. We have been using polymen on the buttocks, left lower leg. Iodoflex on the left foot and silver alginate on the right 3/22; arrives in clinic today with the 2 major issues are the improvement in the left dorsal foot wound which for once actually looks healthy with [name] nice healthy wound surface without debridement. Using Iodoflex here. Unfortunately on the left lateral calf which is in the distal part of his original wound he came to the clinic here for there was purulent drainage noted some increased breakdown scattered around the original area and  [name] small area proximally. We we are using polymen here will change to silver alginate today. His buttock wound on the left is better and I think the area on the right first second toe webspace is also improved 3/29; left dorsal foot looks better. Using Iodoflex. Left ankle culture from deterioration last time grew [name]. coli, Enterobacter and Enterococcus. I will give him [name] course of cefdinir although that will not cover Enterococcus. The area on the right foot in the webspace of the first and second toe lateral first toe looks better. The area on his buttock is about healed Vascular appointment is on April 21. This is to look at his venous system vis--vis continued breakdown of the wounds on the left including the left lateral leg and left dorsal foot he. He has had previous ablations on this side 4/5; the area between the right first and second toes lateral aspect of the first toe looks better. Dorsal aspect of the left first toe on the left foot also improved. Unfortunately the left lateral lower leg is larger and there is [name] second satellite wound superiorly. The usual superficial abrasions on the left buttock overall better but certainly not closed 4/12; the area between the right first and second toes is improved. Dorsal aspect of the left foot also slightly smaller with [name] vibrant healthy looking surface. No real change in the left lateral leg and the left buttock wound is healed He has an unaffordable co-pay for Apligraf. Appointment with vein and vascular with regards to the left leg venous part of the circulation is on 4/21 4/19; we continue to see improvement in all wound areas. Although this is minor. He has his vascular appointment on 4/21. The area on the left buttock has not reopened although right in the center of this area the skin looks somewhat threatened 4/26; the left buttock is unfortunately reopened. In general his left dorsal foot has [name] healthy surface and looks somewhat smaller  although it was not measured as such. The area between his first and second toe webspace on the right as [name] small wound against the first toe. The patient saw vascular surgery. The real question I was asking was about the small  saphenous vein on the left. He has previously ablated left greater saphenous vein. Nothing further was commented on on the left. Right greater saphenous vein without reflux at the saphenofemoral junction or proximal thigh there was no indication for ablation of the right greater saphenous vein duplex was negative for DVT bilaterally. They did not think there was anything from [name] vascular surgery point of view that could be offered. They ABIs within normal limits 5/3; only small open area on the left buttock. The area on the left lateral leg which was his original venous reflux is now 2 wounds both which look clean. We are using Iodoflex on the left dorsal foot which looks healthy and smaller. He is down to [name] very tiny area between the right first and second toes, using silver alginate 5/10; all of his wounds appear better. We have much better edema control in 4 layer compression on the left. This may be the factor that is allowing the left foot and left lateral calf to heal. He has external compression garments at home 04/14/20-All of his wounds are progressing well, the left forefoot is practically closed, left ischium appears to be about the same, right toe webspace is also smaller. The left lateral leg is about the same, continue using Hydrofera Blue to this, silver alginate to the ischium, Iodoflex to the toe space on the right 6/7; most of his wounds outside of the left buttock are doing well. The area on the left lateral calf and left dorsal foot are smaller. The area on the right foot in between the first and second toe webspace is barely visible although he still says there is some drainage here is the only reason I did not heal this out. ooUnfortunately the area on the  left buttock almost looks like he has [name] skin tear from tape. He has open wound and then [name] large flap of skin that we are trying to get adherence over an area just next to the remaining wound 6/21; 2 week follow-up. I believe is been here for nurse visits. Miraculously the area between his first and second toes on the left dorsal foot is closed over. Still open on the right first second web space. The left lateral calf has 2 open areas. Distally this is more superficial. The proximal area had [name] little more depth and required debridement of adherent necrotic material. His buttock wound is actually larger we have been using silver alginate here 6/28; the patient's area on the left foot remains closed. Still open wet area between the first and second toes on the right and also extending into the plantar aspect. We have been using silver alginate in this location. He has 2 areas on the left lower leg part of his original long wounds which I think are better. We have been using Hydrofera Blue here. Hydrofera Blue to the left buttock which is stable 7/12; left foot remains closed. Left ankle is closed. May be [name] small area between his right first and second toes the only truly open area is on the left buttock. We have been using Hydrofera Blue here 7/19; patient arrives with marked deterioration especially in the left foot and ankle. We did not put him in [name] compression wrap on the left last week in fact he wore his juxta lite stockings on either side although he does not have an underlying stocking. He has [name] reopening on the left dorsal foot, left lateral ankle and [name] new area on the right dorsal  ankle. More worrisome is the degree of erythema on the left foot extending on the lateral foot into the lateral lower leg on the left 7/26; the patient had erythema and drainage from the lateral left ankle last week. Culture of this grew MRSA resistant to doxycycline and clindamycin which are the 2 antibiotics we  usually use with this patient who has multiple antibiotic allergies including linezolid, trimethoprim sulfamethoxazole. I had give him an empiric doxycycline and he comes in the area certainly looks somewhat better although it is blotchy in his lower leg. He has not been systemically unwell. Gu, ALCIDES NUTTING ([id number]) [id number] Page 29 of 37 He has had areas on the left dorsal foot which is [name] reopening, chronic wounds on the left lateral ankle. Both of these I think are secondary to chronic venous insufficiency. The area between his first and second toes is closed as far as I can tell. He had [name] new wrap injury on the right dorsal ankle last week. Finally he has an area on the left buttock. We have been using silver alginate to everything except the left buttock we are using Hydrofera Blue 06/30/20-Patient returns at 1 week, has been given [name] sample dose pack of NUZYRA which is [name] tetracycline derivative [omadacycline], patient has completed those, we have been using silver alginate to almost all the wounds except the left ischium where we are using Hydrofera Blue all of them look better 8/16; since I last saw the patient he has been doing well. The area on the left buttock, left lateral ankle and left foot are all closed today. He has completed the [place] I gave him last time and tolerated this well. He still has open areas on the right dorsal ankle and in the right first second toe area which we are using silver alginate. 8/23; we put him in his bilateral external compression stockings last week as he did not have anything open on either leg except for concerning area between the right first and second toe. He comes in today with an area on the left dorsal foot slightly more proximal than the original wound, the left lateral foot but this is actually [name] continuation of the area he had on the left lateral ankle from last time. As well he is opened up on the left buttock  again. 8/30; comes in today with things looking [name] lot better. The area on the left lower ankle has closed down as has the left foot but with eschar in both areas. The area on the dorsal right ankle is also epithelialized. Very little remaining of the left buttock wound. We have been using silver alginate on all wound areas 9/13; the area in the first second toe webspace on the right has fully epithelialized. He still has some vulnerable epithelium on the right and the ankle and the dorsal foot. He notes weeping. He is using his juxta lite stocking. On the left again the left dorsal foot is closed left lateral ankle is closed. We went to the juxta lite stocking here as well. ooStill vulnerable in the left buttock although only 2 small open areas remain here 9/27; 2-week follow-up. We did not look at his left leg but the patient says everything is closed. He is [name] bit disturbed by the amount of edema in his left foot he is using juxta lite stockings but asking about over the toes stockings which would be 30/40, will talk to him next time. According to him there is no open  wound on either the left foot or the left ankle/calf He has an open area on the dorsal right calf which I initially point [name] wrap injury. He has superficial remaining wound on the left ischial tuberosity been using silver alginate although he says this sticks to the wound 10/5; we gave him 2-week follow-up but he called yesterday expressing some concerns about his right foot right ankle and the left buttock. He came in early. There is still no open areas on the left leg and that still in his juxta lite stocking 10/11; he only has 1 small area on the left buttock that remains measuring millimeters 1 mm. Still has the same irritated skin in this area. We recommended zinc oxide when this eventually closes and pressure relief is meticulously is he can do this. He still has an area on the dorsal part of his right first through third toes  which is [name] bit irritated and still open and on the dorsal ankle near the crease of the ankle. We have been using silver alginate and using his own stocking. He has nothing open on the left leg or foot 10/25; 2-week follow-up. Not nearly as good on the left buttock as I was hoping. For open areas with 5 looking threatened small. He has the erythematous irritated chronic skin in this area. oo1 area on the right dorsal ankle. He reports this area bleeds easily ooRight dorsal foot just proximal to the base of his toes ooWe have been using silver alginate. 11/8; 2-week follow-up. Left buttock is about the same although I do not think the wounds are in the same location we have been using silver alginate. I have asked him to use zinc oxide on the skin around the wounds. ooHe still has [name] small area on the right dorsal ankle he reports this bleeds easily ooRight dorsal foot just proximal to the base of the toes does not have anything open although the skin is very dry and scaly ooHe has [name] new opening on the nailbed of the left great toe. Nothing on the left ankle 11/29; 3-week follow-up. Left buttock has 2 open areas. And washing of these wounds today started bleeding easily. Suggesting very friable tissue. We have been using silver alginate. Right dorsal ankle which I thought was initially [name] wrap injury we have been using silver alginate. Nothing open between the toes that I can see. He states the area on the left dorsal toe nailbed healed after the last visit in 2 or 3 days 12/13; 3-week follow-up. His left buttock now has 3 open areas but the original 2 areas are smaller using polymen here. Surrounding skin looks better. The right dorsal ankle is closed. He has [name] small opening on the right dorsal foot at the level of the third toe. In general the skin looks better here. He is wearing his juxta lite stocking on the left leg says there is nothing open 11/24/2020; 3 weeks follow-up. His left buttock  still has the 3 open areas. We have been using polymen but due to lack of response he changed to [employer] area. Surrounding skin is dry erythematous and irritated looking. There is no evidence of infection either bacterial or fungal however there is loss of surface epithelium ooHe still has very dry skin in his foot causing irritation and erythema on the dorsal part of his toes. This is not responded to prolonged courses of antifungal simply looks dry and irritated 1/24; left buttock area still looks about the  same he was unable to find the triad ointment that we had suggested. The area on the right lower leg just above the dorsal ankle has reopened and the areas on the right foot between the first second and second third toes and scaling on the bottom of the foot has been about the same for quite some time now. been using silver alginate to all wound areas 2/7; left buttock wound looked quite good although not much smaller in terms of surface area surrounding skin looks better. Only [name] few dry flaking areas on the right foot in between the first and second toes the skin generally looks better here [ammonium lactate]. Finally the area on the right dorsal ankle is closed 2/21; ooThere is no open area on the right foot even between the right first and second toe. Skin around this area dorsally and plantar aspects look better. ooHe has [name] reopening of the area on the right ankle just above the crease of the ankle dorsally. I continue to think that this is probably friction from spasms may be even this time with his stocking under the compression stockings. ooWounds on his left buttock look about the same there [name] couple of areas that have reopened. He has [name] total square area of loss of epithelialization. This does not look like infection it looks like [name] contact dermatitis but I just cannot determine to what 3/14; there is nothing on the right foot between the first and second toes this was  carefully inspected under illumination. Some chronic irritation on the dorsal part of his foot from toes 1-3 at the base. Nothing really open here substantially. Still has an area on the right foot/ankle that is actually larger and hyper granulated. His buttock area on the left is just about closed however he has chronic inflammation with loss of the surface epithelial layer 3/28; 2-week follow-up. In clinic today with [name] new wound on the left anterior mid tibia. Says this happened about 2 weeks ago. He is not really sure how wonders about the spasticity of his legs at night whether that could have caused this other than that he does not have [name] good idea. He has been using topical antibiotics and silver alginate. The area on his right dorsal ankle seems somewhat better. ooFinally everything on his left buttock is closed. 4/11; 2-week follow-up. All of his wounds are better except for the area over the ischium and left buttock which have opened up widely again. At least part of this is covered in necrotic fibrinous material another part had rolled nonviable skin. The area on the right ankle, left anterior mid tibia are both [name] lot better. He had no open wounds on either foot including the areas between the first and second toes 4/25; patient presents for 2-week follow-up. He states that the wounds are overall stable. He has no complaints today and states he is using Hydrofera Blue to open wounds. 5/9; have not seen this man in over [name] month. For my memory he has open areas on the left mid tibia and right ankle. T oday he has new open area on the right dorsal foot which we have not had [name] problem with recently. He has the sustained area on the left buttock He is also changed his insurance at the beginning of the year [employer]. We will need prior authorizations for debridement 5/23; patient presents for 2-week follow-up. He has prior authorizations for debridement. He denies any issues in the past 2  weeks  with his wound care. He has been using Hydrofera Blue to all the wounds. He does report [name] circular rash to the upper left leg that is new. He denies acute signs of infection. 6/6; 2-week follow-up. The patient has open wounds on the left buttock which are worse than the last time I saw this about [name] month ago. He also has [name] new area to me on the left anterior mid tibia with some surrounding erythema. The area on the dorsal ankle on the right is closed but I think this will be [name] friction injury every time this area is exposed to either our wraps or his compression stockings caused by unrelenting spasms in this leg. 6/20; 2-week follow-up. oo The patient has open wounds on the left buttock which is about the same. Using [facility] here. - The left mid tibia has [name] static amount of surrounding erythema. Also [name] raised area in the center. We have been using Hydrofera Blue here. ooooFinally he has broken down in his dorsal right foot extending between the first and second toes and going to the base of the first and second toe webspace. I have previously assumed that this was severe venous hypertension Polidori, Keontay [name] ([id number]) [id number].pdf Page 30 of 37 7/5; 2-week follow-up oo The left buttock wound actually looks better. We are using Hydrofera Blue. He has extensive skin irritation around this area and I have not really been able to get that any better. I have tried Lotrisone i.[name]. antifungals and steroids. More most recently we have just been using Coloplast really looks about the same. oo The left mid tibia which was new last week culture to have very resistant staph aureus. Not only methicillin-resistant but doxycycline resistant. The patient has [name] plethora of antibiotic allergies including sulfa, linezolid. I used topical bacitracin on this but he has not started this yet. oo In addition he has an expanding area of erythema with [name] wound on the dorsal right  foot. I did [name] deep tissue culture of this area today 7/12; oo Left buttock area actually looks better surrounding skin also looks less irritated. oo Left mid tibia looks about the same. He is using bacitracin this is not worse oo Right dorsal foot looks about the same as well. oo The left first toe also looks about the same 7/19; left buttock wound continues to improve in terms of open areas oo Left mid tibia is still concerning amount of swelling he is using bacitracin oo Dorsal left first toe somewhat smaller oo Right dorsal foot somewhat smaller 7/25; left buttock wound actually continues to improve oo Left mid tibia area has less swelling. I gave him all my samples of new Nuzyra. This seems to have helped although the wound is still open it. His abrasion closed by here oo Left dorsal great toe really no better. Still [name] very nonviable surface oo Right dorsal foot perhaps some better. oo We have been using bacitracin and silver nitrate to the areas on his lower legs and Hydrofera Blue to the area on the buttock. 8/16 oo Disappointed that his left buttock wound is actually more substantial. Apparently during the last nurse visit these were both very small. He has continued irritation to [name] large area of skin on his buttock. I have never been able to totally explain this although I think it some combination of the way he sits, pressure, moisture. He is not incontinent enough to contribute to this. oo Left dorsal great toe still fibrinous debris  on the surface that I have debrided today oo Large area across the dorsal right toes. oo The area on the left anterior mid tibia has less swelling. He completed the [place]. This does not look infected although the tissue is still fried 8/30; 2-week follow-up. oo Left buttock areas not improved. We used Hydrofera Blue on this. Weeping wet with the surrounding erythema that I have not been able to control even with Lotrisone and topical  Coloplast oo Left dorsal great toe looks about the same oo More substantial area again at the base of his toes on the left which is new this week. oo Area across the dorsal right toes looks improved oo The left anterior mid tibia looks like it is trying to close 9/13; 2-week follow-up. Using silver alginate on all of his wounds. The left dorsal foot does not look any better. He has the area on the dorsal toe and also the areas at the base of all of his toes 1 through 3. On the right foot he has [name] similar pattern in [name] similar area. He has the area on his left mid tibia that looks fairly healthy. Finally the area on his left buttock looks somewhat bette 9/20; culture I did of the left foot which was [name] deep tissue culture last time showed [name]. coli he has erythema around this wound. Still [name] completely necrotic surface. His right dorsal foot looks about the same. He has [name] very friable surface to the left anterior mid tibia. Both buttock wounds look better. We have been using silver alginate to all wounds 10/4; he has completed the cephalexin that I gave him last time for the left foot. He is using topical gentamicin under silver alginate silver alginate being applied to all the wounds. Unfortunately all the wounds look irritated on his dorsal right foot dorsal left foot left mid tibia. I wonder if this could be [name] silver allergy. I am going to change him to [facility] on the lower extremity. The skin on the left buttock and left posterior thigh still flaking dry and irritated. This has continued no matter what I have applied topically to this. He has [name] solitary open wound which by itself does not look too bad however the entire area of surrounding skin does not change no matter what we have applied here 10/18; the area on the left dorsal foot and right dorsal foot both look better. The area on the right extends into the plantar but not between his toes. We have been using silver alginate. He still  has [name] rectangular erythematous area around the area on the left tibia. The wound itself is very small. Finally everything on his left buttock looks [name] little larger the skin is erythematous 11/15; patient comes in with the left dorsal and right dorsal foot distally looking somewhat better. Still nonviable surface on the left foot which required debridement. He still has the area on the left anterior mid tibia although this looks somewhat better. He has [name] new area on the right lateral lower leg just above the ankle. Finally his left buttock looks terrible with multiple superficial open wounds geometric square shaped area of chronic erythema which I have not been able to sort through 11/29; right dorsal foot and left dorsal foot both look somewhat better. No debridement required. He has the fragile area on the left anterior mid tibia this looks and continues to look somewhat better. Right lateral lower leg just above the ankle we identified last  time also looks better. In general the area on his left buttock looks improved. We are using Hydrofera Blue to all wound areas 12/13; right dorsal foot looks better. The area on the right lateral leg is healed. Left dorsal foot has 2 open areas both of which require debridement. The fragile area on the left anterior mid tibial looks better. Smaller area on his buttocks. Were using Hydrofera Blue 1/10; patient comes in with everything looking slightly larger and/or worse. This includes his left buttock, reopening of the left mid tibia, larger areas on the left dorsal foot and what looks to be [name] cellulitis on the right dorsal and plantar foot. We have been using Hydrofera Blue on all wounds. 1/17; right dorsal foot distally looks better today. The left foot has 2 open wounds that are about the same surrounding erythema. Culture I did last week showed rare Enterococcus and [name] multidrug resistant MRSA. 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 No malignancy they did not stain for fungus 1/24; his right distal foot is not closed dry and scaly but the wound looks like it is contracting. I did not debride anything here. Problem on his left dorsal foot with expanding erythema. Apparently there were problems last week getting the Riki Altes however it is now available at the [facility] [facility] but [name] week later. He is using ketoconazole and Coloplast to the left buttock along with Hydrofera Blue this actually looks quite [name] bit better today. 1/31; right dorsal foot again is dry and scaly but looking to contract. He has been using [name] moisturizer on his feet at my request but he is not sure which 1. The left dorsal foot wounds look about the same there is erythema here that I marked last week however after course of Nuzyra it certainly is not any better but not any worse either. Finally on his left buttock the skin continues to look better he has the original wound but [name] new substantial area towards the gluteal cleft. Almost like [name] skin tear. I used scissors to remove skin and subcutaneous tissue here silver nitrate and direct pressure 2/7; right dorsal foot. This does not look too much different from last week. Some erythema skin dry and scaly. No debridement. Left dorsal great toe again still not much improvement. I did remove flaking dry skin and callus from around the edge. Finally on his left buttock. The skin is somewhat better in the periwound. Surface wounds are superficial somewhat better than last week. 01/26/2022: Is [name] little bit of [name] mystery as to why his wounds fail to respond to treatments and actually seem to get worse. This is my first encounter with this patient. He was previously followed by Dr. Leanord Hawking. Based upon my review of the chart, it seems that there is [name] little bit of [name] mystery as to why his wounds do not respond as anticipated to the interventions applied and sometimes even get worse. Biopsies have  been performed and he was seen by dermatology in [place], but that did not shed any light on the matter. T oday, his gluteal wound is larger, with substantial drainage, rather malodorous. The food wounds are not terrible, but he has [name] lot of callus and scaly skin around these. He is currently getting silver alginate on the gluteal wound, with idodoflex to the feet. He is using lotrisone on his legs for the dry, scaly skin. Lipscomb, ADIEN KIMMEL ([id number]) [id number] Page 31 of 37 02/09/2022: There  has really been no change to any of his wounds. The gluteal wound less drainage and odor, but remains about the same size, the periwound skin remains oddly scaly. His lower extremity wounds also appear roughly the same size. They continue to accumulate [name] small amount of slough. The periwound on his feet and ankle wounds has dry eschar and loose dead skin. We have been using silver alginate on the gluteal wound and Iodoflex on his feet and ankle wounds. T the periwound around his gluteal lesion and Lotrisone on his feet and legs. o 02/23/2022: The right plantar foot wound is closed. The gluteal site looks small but has continued to produce hypertrophic granulation tissue. The foot wounds all look about the same on the dorsal surface of the right foot; on the left, there is only [name] small open area at the site of where his left great toenail would have been. 03/16/2022: The right ankle wound is healed. The right dorsal foot wound is about the same. The left dorsal foot wound is quite [name] bit smaller and the ischial wound is nearly closed. 03/30/2022: The right ankle wound reopened. Both dorsal foot wounds are quite [name] bit smaller. Unfortunately, he appears to have sheared part of his ischial wound open further, perhaps during [name] transfer. 04/13/2022: The right ankle wound has hypertrophic granulation tissue present. The dorsal foot wounds continue to decrease in size. The ischial wound looks about  the same today, no better, no worse. 04/27/2022: The right ankle wound has closed. Unfortunately, it looks like some moisture got underneath the dry skin on both of his dorsal feet and these wounds have expanded in size. The ischial wound remains the same with perhaps [name] little bit more slough accumulation than at our previous visit. 05/11/2022: The right ankle wound remains closed. There is [name] left anterior tibial wound that is small has patchy openings with accumulated slough. The dorsum of his right foot appears to be nearly healed with just [name] small punctate opening. The plantar surface of his right foot has [name] new opening that looks like he may have picked some skin there. His sacral ulcer has hypertrophic granulation tissue but has some slough accumulation. The dorsum of his left foot has multiple open areas in [name] fairly ragged distribution. All of these have slough accumulated within them. 06/01/2022: The right ankle and left anterior tibial wound are both closed. Dorsum of his right foot and left foot both look substantially better with just tiny scattered openings The without any slough accumulation. He has sheared open new areas on his left gluteus and ischium. He says that his wheelchair cushion, which is air-filled, has [name] leak and so it keeps deflating. He is awaiting [name] new cushion. 06/15/2022: The right ankle wound has reopened and the fat layer is exposed. Both dorsal feet have just small openings with just [name] little bit of slough and eschar accumulation. The wound on his left gluteus and ischium is larger again today and has [name] foul odor. 06/29/2022: The right ankle wound has hypertrophic granulation tissue buildup. His dorsal foot wounds both look better with just some eschar on the surface. He has [name] new wound on his left lateral ankle. He is not sure how he acquired it but by appearance, it looks that he hit it on something, potentially his wheelchair or bed. The ischial wound is about the same but  is cleaner without any significant purulent drainage or odor. He did not understand what the [facility] call was about  and therefore he does not have the topical compounded antibiotic. 07/13/2022: The right ankle wound again has hypertrophic granulation tissue, but less so than at his previous visit. The ischial wound has improved tremendously the use of the [facility] topical antibiotic. No significant change to the left lateral ankle wound; it is fibrotic with slough present. The skin of both of his feet, especially on the right, has [name] yeasty appearance. 08/10/2022: There is again hypertrophic granulation tissue on the right anterior ankle wound. Both feet are about the same. The left lateral ankle wound is [name] little bit desiccated and has some slough buildup. He unfortunately suffered [name] new injury when he was removing his pants and they caught his bandage which caused [name] large skin tear on his left ischium, just distal to the existing wound. The existing left ischial wound, however, is significantly better with just [name] little light slough on the surface. 08/31/2022: The right anterior ankle wound is [name] lot smaller today underneath some eschar. No accumulation of hypertrophic granulation tissue. The left lateral ankle wound has some slough on the surface but is better in terms of moisture balance this week. The left dorsal foot does not really have any openings on it today. The right dorsal foot has some slough and eschar accumulation. His gluteal ulcer is basically closed aside from 2 small areas that are oozing [name] bit. 09/21/2022: The right anterior ankle wound is down to just [name] tiny pinhole. The left lateral ankle wound has accumulated slough again, but moisture balance is good. Left and right dorsal feet both have 2 small openings with some slough in them. The gluteal ulcer, unfortunately has opened up substantially. 10/05/2022: The right anterior ankle wound has reopened and has [name] fair amount of slough on  the surface. The left lateral ankle wound has accumulated more slough, as well. He was approved for Apligraf, but the wound is not clean enough yet. There is some slough buildup on the dorsal right foot wound, as well as on his ischium. 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 10/26/2022: The right anterior ankle wound has closed again. The left lateral ankle wound looks quite [name] bit better. It is ready for Apligraf but we do not have 1 here for him. His ischium continues to get worse, with the wound larger and deeper. It really seems like this is [name] combination of pressure and friction. He has 2 new wounds, 1 on the plantar aspect of each foot. He says that he had some dry skin there that peeled away. Both are superficial. The dorsum of his left foot does not have any new wound opening. The dorsum of his right foot has [name] bit of slough accumulation. 11/24/2022: The right anterior ankle wound has 2 small open areas, both covered with eschar. The left lateral ankle wound has closed and considerably with just [name] little slough on the surface. The ischium has improved most significantly, having closed in most of the open area with just [name] few small remaining superficial openings. The dorsal foot wounds are small and superficial. The bottoms of his feet are closed. 12/07/2022: The right anterior ankle wound remains open with 2 small areas, again with slough and eschar present. The left lateral ankle wound is down to about half [name] centimeter with just some slough on the surface. His right dorsal foot has opened up more and has both slough and eschar present. The ischium has  continued to improve and now just has 2 small open areas that are very clean. 12/21/2022: He has [name] new wound on his right lateral ankle. There is some slough present. The left lateral ankle is quite [name] bit smaller with some slough buildup. His left ischial ulcer is also nearly closed;  there is just [name] tear in his skin that seems likely to be secondary to [name] transfer mishap. He has [name] new ulcer in his natal cleft that looks like it may be secondary to moisture. There is some slough in this area as well. The right anterior ankle is nearly closed. The right dorsal foot wound has some slough accumulation 01/04/2023: The anterior right ankle wound is closed, as is his ischial ulcer. The left lateral ankle wound is nearly closed with just [name] little bit of slough accumulation. The ulcer in his natal cleft is about the same size with slough buildup there, as well. He has [name] new wound on his left lateral leg near the knee from rubbing on his wheelchair. There is slough present, but no signs of infection. The right dorsal foot wound has [name] fairly heavy layer of slough buildup, as well. 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 He has been using his old wheelchair cushion for reasons that are unclear. The ulcer in his natal cleft has healed. The wound on his left lateral leg near the knee is smaller with some slough present. The right dorsal foot wound has accumulated slough again. The right lateral ankle wound is healed. The left lateral ankle wound is smaller again this week. 02/01/2023: His ischial ulcer has had [name] ton of drainage over the last 2 weeks and the drainage has an absolutely putrid odor. He has gone back to using his new wheelchair chair cushion, though. The wound on his left lateral leg near his knee has healed. The left lateral ankle wound is nearly healed. Both the dorsal foot wounds are larger and have [name] lot of slough accumulation and drainage. The wound on his right anterior ankle is healed but there is [name] new 1 just proximal to this, also with slough and eschar present. 02/15/2023:The left lateral and right lateral ankle wounds are both smaller. The right anterior ankle wound has some eschar on the surface. Both dorsal feet have [name] lot of slough accumulation. The ischial  ulcer has epithelialized in multiple areas, leaving several smaller areas that are quite friable and still with [name] pungent Salzman, Nyquan [name] ([id number]) [phone].pdf Page 32 of 37 odor. His new [facility] prescription should be delivered tomorrow. We are using silver alginate on everything at this point. 03/08/2023: Both dorsal foot wounds are about the same size and have [name] lot of slough buildup. The ischial ulcer has improved quite [name] bit. The odor has abated and the surfaces are clean. It is still quite friable and 1 area is oozing. The left lateral and right lateral ankle wounds are nearly closed. The right anterior ankle wound is healed. Patient History Information obtained from Patient. Family History Diabetes - Father, Hypertension - Mother, No family history of Cancer, Heart Disease, Hereditary Spherocytosis, Kidney Disease, Lung Disease, Stroke, Thyroid Problems, Tuberculosis. Social History Former smoker, Marital Status - Married, Alcohol Use - Moderate, Drug Use - No History, Caffeine Use - Daily. Medical History Ear/Nose/Mouth/Throat Denies history of Chronic sinus problems/congestion, Middle ear problems Hematologic/Lymphatic Denies history of Anemia, Hemophilia, Human Immunodeficiency Virus, Lymphedema, Sickle Cell Disease Respiratory Patient has history of Sleep Apnea - does not  tolerate cpap Denies history of Aspiration, Asthma, Chronic Obstructive Pulmonary Disease (COPD), Pneumothorax, Tuberculosis Cardiovascular Patient has history of Hypertension - lisinopril HCTZ Denies history of Angina, Arrhythmia, Congestive Heart Failure, Coronary Artery Disease, Deep Vein Thrombosis, Hypotension, Myocardial Infarction, Peripheral Arterial Disease, Peripheral Venous Disease, Phlebitis, Vasculitis Gastrointestinal Denies history of Cirrhosis , Colitis, Crohnoos, Hepatitis [name], Hepatitis B, Hepatitis C Endocrine Denies history of Type I Diabetes, Type II  Diabetes Genitourinary Denies history of End Stage Renal Disease Immunological Denies history of Lupus Erythematosus, Raynaudoos, Scleroderma Integumentary (Skin) Denies history of History of Burn Musculoskeletal Denies history of Gout, Rheumatoid Arthritis, Osteoarthritis, Osteomyelitis Neurologic Patient has history of Paraplegia Denies history of Dementia, Neuropathy, Quadriplegia, Seizure Disorder Oncologic Denies history of Received Chemotherapy, Received Radiation Psychiatric Denies history of Anorexia/bulimia, Confinement Anxiety Hospitalization/Surgery History - cellulitis in leg. - left leg vein ablation. Objective Constitutional Slightly tachycardic. no acute distress. Vitals Time Taken: 7:40 AM, Height: 70 in, Weight: 216 lbs, BMI: 31, Temperature: 97.9 F, Pulse: 109 bpm, Respiratory Rate: 18 breaths/min, Blood Pressure: 124/68 mmHg. Respiratory Normal work of breathing on room air. General Notes: 03/08/2023: Both dorsal foot wounds are about the same size and have [name] lot of slough buildup. The ischial ulcer has improved quite [name] bit. The odor has abated and the surfaces are clean. It is still quite friable and 1 area is oozing. The left lateral and right lateral ankle wounds are nearly closed. The right anterior ankle wound is healed. Integumentary (Hair, Skin) Wound #52 status is Open. Original cause of wound was Gradually Appeared. The date acquired was: 03/27/2021. The wound has been in treatment 101 weeks. The wound is located on the Right,Dorsal Foot. The wound measures 1.1cm length x 3cm width x 0.1cm depth; 2.592cm^2 area and 0.259cm^3 volume. There is Fat Layer (Subcutaneous Tissue) exposed. There is no tunneling or undermining noted. There is [name] medium amount of serosanguineous drainage noted. The wound margin is flat and intact. There is small (1-33%) pink granulation within the wound bed. There is [name] large (67-100%) amount of necrotic tissue within the wound bed  including Adherent Slough. The periwound skin appearance had no abnormalities noted for texture. The periwound skin appearance had no abnormalities noted for color. The periwound skin appearance exhibited: Dry/Scaly. Periwound temperature was noted as No Abnormality. Wound #56 status is Open. Original cause of wound was Gradually Appeared. The date acquired was: 07/11/2021. The wound has been in treatment 85 weeks. The wound is located on the Left,Dorsal Foot. The wound measures 3.3cm length x 3.5cm width x 0.1cm depth; 9.071cm^2 area and 0.907cm^3 volume. There is Fat Layer (Subcutaneous Tissue) exposed. There is no tunneling or undermining noted. There is [name] medium amount of serosanguineous drainage noted. The wound margin is flat and intact. There is medium (34-66%) red granulation within the wound bed. There is [name] medium (34-66%) amount of necrotic tissue within the wound bed. The periwound skin appearance had no abnormalities noted for texture. The periwound skin appearance exhibited: Dry/Scaly. The periwound skin Bagnell, Decklan [name] ([id number]) [phone].pdf Page 33 of 37 appearance did not exhibit: Maceration, Erythema. Periwound temperature was noted as No Abnormality. Wound #67 status is Open. Original cause of wound was Gradually Appeared. The date acquired was: 06/22/2022. The wound has been in treatment 36 weeks. The wound is located on the Left,Lateral Lower Leg. The wound measures 0.4cm length x 0.4cm width x 0.1cm depth; 0.126cm^2 area and 0.013cm^3 volume. There is Fat Layer (Subcutaneous Tissue) exposed. There is no tunneling or undermining  noted. There is [name] small amount of serosanguineous drainage noted. The wound margin is distinct with the outline attached to the wound base. There is small (1-33%) red granulation within the wound bed. There is [name] large (67-100%) amount of necrotic tissue within the wound bed including Adherent Slough. The periwound skin appearance had  no abnormalities noted for texture. The periwound skin appearance exhibited: Dry/Scaly. The periwound skin appearance did not exhibit: Erythema. Periwound temperature was noted as No Abnormality. Wound #74 status is Open. Original cause of wound was Pressure Injury. The date acquired was: 01/10/2023. The wound has been in treatment 7 weeks. The wound is located on the Left,Posterior Upper Leg. The wound measures 9.5cm length x 4.5cm width x 0.1cm depth; 33.576cm^2 area and 3.358cm^3 volume. There is Fat Layer (Subcutaneous Tissue) exposed. There is no tunneling or undermining noted. There is [name] large amount of serosanguineous drainage noted. Foul odor after cleansing was noted. The wound margin is flat and intact. There is large (67-100%) red, friable granulation within the wound bed. There is [name] small (1-33%) amount of necrotic tissue within the wound bed including Adherent Slough. The periwound skin appearance had no abnormalities noted for moisture. The periwound skin appearance had no abnormalities noted for color. The periwound skin appearance exhibited: Scarring. Periwound temperature was noted as No Abnormality. Wound #75 status is Healed - Epithelialized. Original cause of wound was Gradually Appeared. The date acquired was: 02/01/2023. The wound has been in treatment 5 weeks. The wound is located on the Right,Distal,Anterior Lower Leg. The wound measures 0cm length x 0cm width x 0cm depth; 0cm^2 area and 0cm^3 volume. There is Fat Layer (Subcutaneous Tissue) exposed. There is no tunneling or undermining noted. There is [name] medium amount of serosanguineous drainage noted. The wound margin is distinct with the outline attached to the wound base. There is medium (34-66%) red granulation within the wound bed. There is [name] medium (34-66%) amount of necrotic tissue within the wound bed including Eschar. The periwound skin appearance had no abnormalities noted for texture. The periwound skin appearance  exhibited: Dry/Scaly, Erythema. The surrounding wound skin color is noted with erythema. Erythema is measured at 7 cm. Periwound temperature was noted as No Abnormality. Wound #76 status is Open. Original cause of wound was Pressure Injury. The date acquired was: 02/08/2023. The wound has been in treatment 3 weeks. The wound is located on the Right,Lateral Lower Leg. The wound measures 0.3cm length x 0.2cm width x 0.1cm depth; 0.047cm^2 area and 0.005cm^3 volume. There is Fat Layer (Subcutaneous Tissue) exposed. There is no tunneling or undermining noted. There is [name] medium amount of serosanguineous drainage noted. The wound margin is flat and intact. There is small (1-33%) pink granulation within the wound bed. There is [name] large (67-100%) amount of necrotic tissue within the wound bed including Eschar. The periwound skin appearance had no abnormalities noted for texture. The periwound skin appearance had no abnormalities noted for color. The periwound skin appearance exhibited: Dry/Scaly. 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 There was [name] Selective/Open Wound Non-Viable Tissue Debridement with [name] total area of 3.3 sq cm performed by Duanne Guess, MD. With the following instrument(s): Curette to remove Non-Viable tissue/material. Material removed includes [facility]. No specimens were taken. [name] time out was conducted at 08:15, prior to the start of the procedure. [name] Minimum amount of bleeding was controlled with Pressure. The procedure was tolerated well with [name] pain level of Insensate throughout and [name] pain level of Insensate following the procedure. Post Debridement Measurements: 1.1cm length x 3cm width x 0.1cm depth; 0.259cm^3 volume. Character of Wound/Ulcer Post Debridement is improved. Severity of Tissue Post Debridement is: Fat layer exposed. Post procedure Diagnosis Wound #52: Same as Pre-Procedure General Notes: Scribed for Dr. Lady Gary by Zenaida Deed, RN. 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 There was [name] Selective/Open Wound Non-Viable Tissue Debridement with [name] total area of 11.55 sq cm performed by Duanne Guess, MD. With the following instrument(s): Curette to remove Non-Viable tissue/material. Material removed includes [facility]. No specimens were taken. [name] time out was conducted at 08:15, prior to the start of the procedure. [name] Minimum amount of bleeding was controlled with Pressure. The procedure was tolerated well with [name] pain level of Insensate throughout and [name] pain level of Insensate following the procedure. Post Debridement Measurements: 3.3cm length x 3.5cm width x 0.1cm depth; 0.907cm^3 volume. Character of Wound/Ulcer Post Debridement is improved. Post procedure Diagnosis Wound #56: Same as Pre-Procedure General Notes: Scribed for Dr. Lady Gary by Zenaida Deed, RN. 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 There was [name] Selective/Open Wound Non-Viable Tissue Debridement with [name] total area of 0.16 sq cm performed by Duanne Guess, MD. With the following instrument(s): Curette to remove Non-Viable tissue/material. Material removed includes [facility]. No specimens were taken. [name] time out was conducted at 08:15, prior to the start of the procedure. [name] Minimum amount of bleeding was controlled with Pressure. The procedure was tolerated well with [name] pain level of Insensate throughout and [name] pain level of Insensate following the procedure. Post Debridement Measurements: 0.4cm length x 0.4cm width x 0.1cm depth; 0.013cm^3 volume. Character of Wound/Ulcer Post Debridement is improved. Severity of Tissue Post Debridement is: Fat layer exposed. Post procedure Diagnosis Wound #67: Same as Pre-Procedure General Notes: Scribed for Dr. Lady Gary by Zenaida Deed, RN. 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 There was [name] Selective/Open Wound Non-Viable Tissue Debridement with [name] total area of 0.06 sq cm performed by Duanne Guess, MD. With the following instrument(s): Curette to remove Non-Viable tissue/material. Material removed includes [facility]. No specimens were taken. [name] time out was conducted at 08:15, prior to the start of the procedure. [name] Minimum amount of bleeding was controlled with Pressure. The procedure was tolerated well with [name] pain level of Insensate throughout and [name] pain level of Insensate following the procedure. Post Debridement Measurements: 0.3cm length x 0.2cm width x 0.1cm depth; 0.005cm^3 volume. Character of Wound/Ulcer Post Debridement is improved. Severity of  Tissue Post Debridement is: Fat layer exposed. Post procedure Diagnosis Wound #76: Same as Pre-Procedure General Notes: Scribed for Dr. Lady Gary by Zenaida Deed, RN. Plan Follow-up Appointments: Return Appointment in 2 weeks. - Dr. Lady Gary RM 1 Tuesday  4/30 @ 0815 am Bathing/ Shower/ Hygiene: May shower and wash wound with soap and water. - on days that dressing is changed Edema Control - Lymphedema / SCD / Other: Moisturize legs daily. - using Aquaphor generously to both legs and feet with dressing changes Compression stocking or Garment 30-40 mm/Hg pressure to: - Juxtalite to both legs daily Off-Loading: Roho cushion for wheelchair - use newer cushion Turn and reposition every 2 hours - lift up with arms in wheelchair every hour during the day WOUND #52: - Foot Wound Laterality: Dorsal, Right Cleanser: Soap and Water Every Other Day/30 Days Discharge Instructions: May shower and wash wound with dial antibacterial soap and water prior to dressing change. Cleanser: Wound Cleanser Every Other Day/30 Days Discharge Instructions: Cleanse the wound with wound cleanser prior to applying [name] clean dressing using gauze sponges, not tissue or cotton balls. Peri-Wound Care: Sween Lotion (Moisturizing lotion) Every Other Day/30 Days Discharge Instructions: Apply Aquaphor moisturizing lotion as directed Topical: Mupirocin Ointment Every Other Day/30 Days Discharge Instructions: Apply Mupirocin (Bactroban) as instructed Prim Dressing: Maxorb Extra Ag+ Alginate Dressing, 2x2 (in/in) Every Other Day/30 Days ary Discharge Instructions: Apply to wound bed as instructed Secondary Dressing: Woven Gauze Sponge, Non-Sterile 4x4 in Every Other Day/30 Days Discharge Instructions: Apply over primary dressing as directed. Secured With: [employer], 4.5x3.1 (in/yd) (Generic) Every Other Day/30 Days Discharge Instructions: Secure with Kerlix as directed. Secured With: 34M Medipore H Soft Cloth Surgical T ape, 4 x 10 (in/yd) (Generic) Every Other Day/30 Days Discharge Instructions: Secure with tape as directed. WOUND #56: - Foot Wound Laterality: Dorsal, Left Cleanser: Soap and Water Every Other Day/30 Days Discharge Instructions: May shower and wash wound  with dial antibacterial soap and water prior to dressing change. Cleanser: Wound Cleanser Every Other Day/30 Days Discharge Instructions: Cleanse the wound with wound cleanser prior to applying [name] clean dressing using gauze sponges, not tissue or cotton balls. Peri-Wound Care: Sween Lotion (Moisturizing lotion) Every Other Day/30 Days Discharge Instructions: Apply Aquaphor moisturizing lotion as directed Topical: Mupirocin Ointment Every Other Day/30 Days Discharge Instructions: Apply Mupirocin (Bactroban) as instructed Prim Dressing: Maxorb Extra Ag+ Alginate Dressing, 2x2 (in/in) Every Other Day/30 Days ary Discharge Instructions: Apply to wound bed as instructed Secondary Dressing: Woven Gauze Sponge, Non-Sterile 4x4 in Every Other Day/30 Days Discharge Instructions: Apply over primary dressing as directed. Secured With: [employer], 4.5x3.1 (in/yd) (Generic) Every Other Day/30 Days Discharge Instructions: Secure with Kerlix as directed. Secured With: 34M Medipore H Soft Cloth Surgical T ape, 4 x 10 (in/yd) (Generic) Every Other Day/30 Days Discharge Instructions: Secure with tape as directed. WOUND #67: - Lower Leg Wound Laterality: Left, Lateral Cleanser: Soap and Water Every Other Day/30 Days Discharge Instructions: May shower and wash wound with dial antibacterial soap and water prior to dressing change. Cleanser: Wound Cleanser Every Other Day/30 Days Discharge Instructions: Cleanse the wound with wound cleanser prior to applying [name] clean dressing using gauze sponges, not tissue or cotton balls. Peri-Wound Care: Sween Lotion (Moisturizing lotion) Every Other Day/30 Days Discharge Instructions: Apply Aquaphor moisturizing lotion as directed Topical: Mupirocin Ointment Every Other Day/30 Days Discharge Instructions: Apply Mupirocin (Bactroban) as instructed Prim Dressing: Maxorb Extra Ag+ Alginate Dressing, 2x2 (in/in) Every Other Day/30 Days ary Discharge  Instructions: Apply to  wound bed as instructed Secondary Dressing: Woven Gauze Sponge, Non-Sterile 4x4 in Every Other Day/30 Days Discharge Instructions: Apply over primary dressing as directed. Secured With: [employer], 4.5x3.1 (in/yd) (Generic) Every Other Day/30 Days Discharge Instructions: Secure with Kerlix as directed. Secured With: 58M Medipore H Soft Cloth Surgical T ape, 4 x 10 (in/yd) (Generic) Every Other Day/30 Days Discharge Instructions: Secure with tape as directed. WOUND #74: - Upper Leg Wound Laterality: Left, Posterior Cleanser: Vashe 5.8 (oz) 1 x Per Day/30 Days Discharge Instructions: Cleanse the wound with Vashe prior to applying [name] clean dressing using gauze sponges , let sit on wound for 10 minutes Peri-Wound Care: Zinc Oxide Ointment 30g tube 1 x Per Day/30 Days Discharge Instructions: Apply Zinc Oxide to periwound with each dressing change as needed fpr moisture Topical: Keystone antibiotic compound 1 x Per Day/30 Days Prim Dressing: Maxorb Extra Ag+ Alginate Dressing, 4x4.75 (in/in) 1 x Per Day/30 Days ary Discharge Instructions: Apply to wound bed as instructed Secondary Dressing: Zetuvit Plus Silicone Border Sacrum Dressing, Sm, 7x7 (in/in) 1 x Per Day/30 Days Discharge Instructions: Apply silicone border over primary dressing as directed. Secured With: 58M Medipore H Soft Cloth Surgical T ape, 4 x 10 (in/yd) 1 x Per Day/30 Days Discharge Instructions: Secure with tape as directed. WOUND #76: - Lower Leg Wound Laterality: Right, Lateral Cappelli, Donavin [name] ([id number]) [id number] Page 35 of 37 Cleanser: Soap and Water Every Other Day/30 Days Discharge Instructions: May shower and wash wound with dial antibacterial soap and water prior to dressing change. Cleanser: Wound Cleanser Every Other Day/30 Days Discharge Instructions: Cleanse the wound with wound cleanser prior to applying [name] clean dressing using gauze sponges, not tissue or cotton balls. Peri-Wound  Care: Sween Lotion (Moisturizing lotion) Every Other Day/30 Days Discharge Instructions: Apply Aquaphor moisturizing lotion as directed Topical: Mupirocin Ointment Every Other Day/30 Days Discharge Instructions: Apply Mupirocin (Bactroban) as instructed Prim Dressing: Maxorb Extra Ag+ Alginate Dressing, 2x2 (in/in) Every Other Day/30 Days ary Discharge Instructions: Apply to wound bed as instructed Secondary Dressing: Woven Gauze Sponge, Non-Sterile 4x4 in Every Other Day/30 Days Discharge Instructions: Apply over primary dressing as directed. Secured With: [employer], 4.5x3.1 (in/yd) (Generic) Every Other Day/30 Days Discharge Instructions: Secure with Kerlix as directed. Secured With: 58M Medipore H Soft Cloth Surgical T ape, 4 x 10 (in/yd) (Generic) Every Other Day/30 Days Discharge Instructions: Secure with tape as directed. 03/08/2023: Both dorsal foot wounds are about the same size and have [name] lot of slough buildup. The ischial ulcer has improved quite [name] bit. The odor has abated and the surfaces are clean. It is still quite friable and 1 area is oozing. The left lateral and right lateral ankle wounds are nearly closed. The right anterior ankle wound is healed. I used [name] curette to debride slough off of both dorsal foot wounds and the left lateral ankle wound. I debrided eschar off of the right lateral ankle wound. The ischial ulcers did not require any debridement. We will continue using his Keystone topical antibiotic compound (prescription drug) to the ischial ulcer and we are going to add topical mupirocin to the ulcers on his feet and ankles. Continue silver alginate to all sites. He will follow-up in 2 weeks. Electronic Signature(s) Signed: 03/08/2023 8:33:08 AM By: Duanne Guess MD FACS Entered By: Duanne Guess on 03/08/2023 08:33:08 -------------------------------------------------------------------------------- HxROS Details Patient Name: Date of Service: Sohail, [name]  LEX [name]. 03/08/2023 7:30 [name] Medical Record Number: [id number] Patient  Account Number: [id number] Date of Birth/Sex: Treating RN: 1985/11/29 (37 y.o. M) Primary Care Provider: [name], [name] Other Clinician: Referring Provider: Treating Provider/Extender: Fredderick Severance [name]: 374 Information Obtained From Patient Ear/Nose/Mouth/Throat Medical History: Negative for: Chronic sinus problems/congestion; Middle ear problems Hematologic/Lymphatic Medical History: Negative for: Anemia; Hemophilia; Human Immunodeficiency Virus; Lymphedema; Sickle Cell Disease Respiratory Medical History: Positive for: Sleep Apnea - does not tolerate cpap Negative for: Aspiration; Asthma; Chronic Obstructive Pulmonary Disease (COPD); Pneumothorax; Tuberculosis Cardiovascular Medical History: Positive for: Hypertension - lisinopril HCTZ Negative for: Angina; Arrhythmia; Congestive Heart Failure; Coronary Artery Disease; Deep Vein Thrombosis; Hypotension; Myocardial Infarction; Peripheral Arterial Disease; Peripheral Venous Disease; Phlebitis; Vasculitis Gastrointestinal Medical History: Negative for: Cirrhosis ; Colitis; Crohns; Hepatitis [name]; Hepatitis B; Hepatitis C Endocrine Medical History: Negative for: Type I Diabetes; Type II Diabetes Rasco, Nathian [name] ([id number]) [id number] Page 36 of 37 Genitourinary Medical History: Negative for: End Stage Renal Disease Immunological Medical History: Negative for: Lupus Erythematosus; Raynauds; Scleroderma Integumentary (Skin) Medical History: Negative for: History of Burn Musculoskeletal Medical History: Negative for: Gout; Rheumatoid Arthritis; Osteoarthritis; Osteomyelitis Neurologic Medical History: Positive for: Paraplegia Negative for: Dementia; Neuropathy; Quadriplegia; Seizure Disorder Oncologic Medical History: Negative for: Received Chemotherapy; Received Radiation Psychiatric Medical  History: Negative for: Anorexia/bulimia; Confinement Anxiety Immunizations Pneumococcal Vaccine: Received Pneumococcal Vaccination: No Immunization Notes: doesn't remember when last tetanus shot was Implantable Devices No devices added Hospitalization / Surgery History Type of Hospitalization/Surgery cellulitis in leg left leg vein ablation Family and Social History Cancer: No; Diabetes: Yes - Father; Heart Disease: No; Hereditary Spherocytosis: No; Hypertension: Yes - Mother; Kidney Disease: No; Lung Disease: No; Stroke: No; Thyroid Problems: No; Tuberculosis: No; Former smoker; Marital Status - Married; Alcohol Use: Moderate; Drug Use: No History; Caffeine Use: Daily; Financial Concerns: No; Food, Clothing or Shelter Needs: No; Support System Lacking: No; Transportation Concerns: No Electronic Signature(s) Signed: 03/08/2023 10:57:43 AM By: Duanne Guess MD FACS Entered By: Duanne Guess on 03/08/2023 08:30:48 -------------------------------------------------------------------------------- SuperBill Details Patient Name: Date of Service: Paletta, [name] 03/08/2023 Medical Record Number: [id number] Patient Account Number: [id number] Date of Birth/Sex: Treating RN: Mar 20, 1986 (36 y.o. M) Primary Care Provider: [name], [name] Other Clinician: Referring Provider: Treating Provider/Extender: Benjamine Mola, [name]: 374 Diagnosis Coding Ben, Christorpher [name] ([id number]) [phone].pdf Page 37 of 37 ICD-10 Codes Code Description I87.332 Chronic venous hypertension (idiopathic) with ulcer and inflammation of left lower extremity L97.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, 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 with other 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, complete L97.522 Non-pressure chronic ulcer of other part of  left foot with fat layer exposed 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 L98.492 Non-pressure chronic ulcer of skin of other sites with fat layer exposed Facility Procedures : CPT4 Code: [id number] Description: 28315 - DEBRIDE WOUND 1ST 20 SQ CM OR < ICD-10 Diagnosis Description L97.522 Non-pressure chronic ulcer of other part of left foot with fat layer exposed L97.518 Non-pressure chronic ulcer of other part of right foot with other specified  sever 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 Modifier: ity Quantity: 1 Physician Procedures : CPT4 Code Description Modifier [id number] - WC PHYS LEVEL 4 - EST PT 25 ICD-10 Diagnosis Description  Z61.096 Pressure ulcer of left buttock, stage 3 L97.522 Non-pressure chronic ulcer of other part of left foot with fat layer exposed L97.518  Non-pressure chronic ulcer of other part of right foot with other specified severity L97.322 Non-pressure chronic ulcer of left ankle with fat layer exposed Quantity: 1 : [id number] - WC PHYS DEBR WO ANESTH 20 SQ CM ICD-10 Diagnosis Description L97.522 Non-pressure chronic ulcer of other part of left foot with fat layer exposed L97.518 Non-pressure chronic ulcer of other part of right foot with other specified  severity 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 Quantity: 1 Electronic Signature(s) Signed: 03/08/2023 8:34:07 AM By: Duanne Guess MD FACS Entered By: Duanne Guess on 03/08/2023 08:34:04

## 2023-03-10 NOTE — Progress Notes (Signed)
Crow, Jushua E ([id number]) [phone].pdf Page 1 of 11 Visit Report for 02/01/2023 Arrival Information Details Patient Name: Date of Service: Gehl, [name] 02/01/2023 8:15 A M Medical Record Number: [id number] Patient Account Number: [id number] Date of Birth/Sex: Treating RN: 1985/12/25 (37 y.o. [name] Primary Care Jef Futch: [name], [name] Other Clinician: Referring Geraldyne Barraclough: Treating Lindie Roberson/Extender: Fredderick Severance [name]: 369 Visit Information History Since Last Visit All ordered tests and consults were completed: Yes Patient Arrived: Wheel Chair Added or deleted any medications: No Arrival Time: 08:22 Any new allergies or adverse reactions: No Accompanied By: self Had a fall or experienced change in No Transfer Assistance: None activities of daily living that may affect Patient Identification Verified: Yes risk of falls: Secondary Verification Process Completed: Yes Signs or symptoms of abuse/neglect since last visito No Patient Requires Transmission-Based Precautions: No Hospitalized since last visit: No Patient Has Alerts: Yes Implantable device outside of the clinic excluding No Patient Alerts: R ABI = 1.0 cellular tissue based products placed in the center L ABI = 1.1 since last visit: Has Dressing in Place as Prescribed: Yes Pain Present Now: No Electronic Signature(s) Signed: 03/10/2023 1:55:51 PM By: Brenton Grills Entered By: Brenton Grills on 02/01/2023 08:25:40 -------------------------------------------------------------------------------- Lower Extremity Assessment Details Patient Name: Date of Service: Akkerman, [name] 02/01/2023 8:15 A M Medical Record Number: [id number] Patient Account Number: [id number] Date of Birth/Sex: Treating RN: [birth year] (37 y.o. [name] Primary Care Ollis Daudelin: [name], [name] Other Clinician: Referring Kayceon Oki: Treating Orian Figueira/Extender: Benjamine Mola, [name]: 369 Edema Assessment Assessed: [Left: No] [Right: No] Edema: [Left: Yes] [Right: Yes] Calf Left: Right: Point of Measurement: 33 cm From Medial Instep 29 cm 33.3 cm Ankle Left: Right: Point of Measurement: 10 cm From Medial Instep 24.5 cm 24.8 cm Vascular Assessment Pulses: Dorsalis Pedis Palpable: [Left:Yes] [Right:Yes] Electronic Signature(s) Signed: 03/10/2023 1:55:51 PM By: Brenton Grills Entered By: Brenton Grills on 02/01/2023 08:37:07 Drapeau, [name] ([id number]) [id number] Page 2 of 11 -------------------------------------------------------------------------------- Multi Wound Chart Details Patient Name: Date of Service: Montour, [name] 02/01/2023 8:15 A M Medical Record Number: [id number] Patient Account Number: [id number] Date of Birth/Sex: Treating RN: [birth year] (37 y.o. 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 Primary Etiology: Sleep Apnea, Hypertension, Paraplegia Sleep Apnea, Hypertension, Paraplegia Sleep Apnea, Hypertension, Paraplegia Comorbid History: 03/27/2021 07/11/2021 06/22/2022 Date Acquired: 96 80 31 Weeks of Treatment: Open Open Open Wound Status: No No No Wound Recurrence: No Yes No Clustered Wound: 1.4x4.2x0.1 3.4x6x0.1 1.2x0.8x0.1 Measurements L x W x D (cm) 4.618 16.022 0.754 A (cm) : rea 0.462 1.602 0.075 Volume (cm) : -145.00% -157.60%  86.60% % Reduction in A rea: -145.70% -157.60% 86.60% % Reduction in Volume: Full Thickness Without Exposed Full Thickness Without Exposed Full Thickness Without Exposed Classification: Support Structures Support Structures Support Structures Medium Small Medium Exudate A mount: Serosanguineous Serous Serosanguineous Exudate Type: red, brown amber red, brown Exudate Color: Flat and Intact Flat and Intact Distinct, outline attached Wound Margin: Small (1-33%) Small (1-33%) Large (67-100%) Granulation A mount: Red Red Red Granulation Quality: Large (67-100%) Small (1-33%) Small (1-33%)  Necrotic A mount: Fat Layer (Subcutaneous Tissue): Yes Fat Layer (Subcutaneous Tissue): Yes Fat Layer (Subcutaneous Tissue): Yes Exposed Structures: Fascia: No Fascia: No Fascia: No Tendon: No Tendon: No Tendon: No Muscle: No Muscle: No Muscle: No Joint: No Joint: No Joint: No Bone: No Bone: No Bone: No Small (1-33%) Large (67-100%) Medium (34-66%) Epithelialization: Debridement - Excisional Debridement - Excisional Debridement - Excisional Debridement: Pre-procedure Verification/Time Out 09:05 09:05 09:05 Taken: Subcutaneous, Slough Subcutaneous, Slough Subcutaneous, [employer] Tissue Debrided: Skin/Subcutaneous Tissue Skin/Subcutaneous Tissue Skin/Subcutaneous Tissue Level: 5.88 20.4 0.96 Debridement A (sq cm): rea Curette Curette Curette Instrument: Minimum Minimum Minimum Bleeding: Pressure Pressure Pressure Hemostasis A chieved: Insensate Insensate Insensate Procedural Pain: Insensate Insensate Insensate Post Procedural Pain: Procedure was tolerated well Procedure was tolerated well Procedure was tolerated well Debridement Treatment Response: 1.4x4.2x0.1 3.4x6x0.1 1.2x0.8x0.1 Post Debridement Measurements L x W x D (cm) 0.462 1.602 0.075 Post Debridement Volume: (cm) N/A N/A N/A Post Debridement Stage: No Abnormalities Noted No Abnormalities Noted No Abnormalities  Noted Periwound Skin Texture: Dry/Scaly: Yes Dry/Scaly: Yes Dry/Scaly: Yes Periwound Skin Moisture: Maceration: No Erythema: Yes Erythema: No Erythema: Yes Periwound Skin Color: N/A N/A Red Streaks Erythema Location: N/A N/A No Change Erythema Change: No Abnormality No Abnormality No Abnormality Temperature: Debridement Debridement Debridement Procedures Performed: Enzor, [name] ([id number]) [id number] Page 3 of 11 Wound Number: [id number] Photos: Left, Proximal, Lateral Lower Leg Left, Posterior Upper Leg Right, Distal, Anterior Lower Leg Wound Location: Pressure Injury Pressure Injury Gradually Appeared Wounding Event: Pressure Ulcer Pressure Ulcer Venous Leg Ulcer Primary Etiology: Sleep Apnea, Hypertension, Paraplegia Sleep Apnea, Hypertension, Paraplegia Sleep Apnea, Hypertension, Paraplegia Comorbid History: 12/21/2022 01/10/2023 02/01/2023 Date Acquired: 4 2 0 Weeks of Treatment: Healed - Epithelialized Open Open Wound Status: No No No Wound Recurrence: No No No Clustered Wound: 0x0x0 9.7x6.8x0.1 0.6x0.8x0.1 Measurements L x W x D (cm) 0 51.805 0.377 A (cm) : rea 0 5.18 0.038 Volume (cm) : 100.00% 30.60% N/A % Reduction in A rea: 100.00% 30.60% N/A % Reduction in Volume: Category/Stage III Category/Stage III Full Thickness Without Exposed Classification: Support Structures Small Large Small Exudate A mount: Serosanguineous Serosanguineous Serosanguineous Exudate Type: red, brown red, brown red, brown Exudate Color: N/A Flat and Intact Distinct, outline attached Wound Margin: None Present (0%) Medium (34-66%) Small (1-33%) Granulation A mount: N/A Red Red Granulation Quality: None Present (0%) Medium (34-66%) None Present (0%) Necrotic A mount: Fascia: No Fat Layer (Subcutaneous Tissue): Yes Fat Layer (Subcutaneous Tissue): Yes Exposed Structures: Fat Layer (Subcutaneous Tissue): No Fascia: No Tendon: No Tendon:  No Muscle: No Muscle: No Joint: No Joint: No Bone: No Bone: No Large (67-100%) Small (1-33%) N/A Epithelialization: N/A Debridement - Excisional Debridement - Selective/Open Wound Debridement: Pre-procedure Verification/Time Out N/A 09:05 09:05 Taken: N/A Subcutaneous, Slough Necrotic/Eschar, [employer] Tissue Debrided: N/A Skin/Subcutaneous Tissue Non-Viable Tissue Level: N/A 48 0.48 Debridement A (sq cm): rea N/A Curette Curette Instrument: N/A Minimum Minimum Bleeding: N/A Pressure Pressure Hemostasis A chieved: N/A Insensate Insensate Procedural Pain: N/A Insensate Insensate Post Procedural Pain: N/A Procedure was tolerated well Procedure was tolerated well Debridement Treatment Response: N/A 9.7x6.8x0.1 0.6x0.8x0.1 Post Debridement Measurements L x W x D (cm) N/A 5.18 0.038 Post Debridement Volume: (cm) N/A Category/Stage III N/A Post Debridement Stage: No Abnormalities Noted Scarring: Yes No Abnormalities Noted Periwound Skin Texture: No Abnormalities Noted No Abnormalities Noted Dry/Scaly: Yes Periwound Skin Moisture: Erythema: No No Abnormalities Noted No Abnormalities Noted Periwound Skin Color: N/A N/A N/A Erythema Location: N/A N/A N/A Erythema Change: No  Abnormality No Abnormality No Abnormality Temperature: N/A Debridement Debridement Procedures Performed: Treatment Notes Electronic Signature(s) Signed: 02/01/2023 10:03:17 AM By: Duanne Guess MD FACS Signed: 02/01/2023 4:35:56 PM By: Zenaida Deed RN, BSN Entered By: Duanne Guess on 02/01/2023 10:03:16 -------------------------------------------------------------------------------- Multi-Disciplinary Care Plan Details Patient Name: Date of Service: Rayon, [name] 02/01/2023 8:15 A M Medical Record Number: [id number] Patient Account Number: [id number] Date of Birth/Sex: Treating RN: 1985/11/26 (36 y.o. Damaris Schooner Woden, [name] ([id number]) [phone].pdf  Page 4 of 11 Primary Care Hatim Homann: [name], [name] Other Clinician: Referring Bandy Honaker: Treating Daiana Vitiello/Extender: Fredderick Severance [name]: 369 Multidisciplinary Care Plan reviewed with physician Active Inactive Pressure Nursing Diagnoses: Knowledge deficit related to management of pressures ulcers Potential for impaired tissue integrity related to pressure, friction, moisture, and shear Goals: Patient will remain free from development of additional pressure ulcers Date Initiated: 06/21/2018 Date Inactivated: 02/12/2019 Target Resolution Date: 02/16/2019 Unmet Reason: pressure ulcer Goal Status: Unmet reopened on left ischium Patient/caregiver will verbalize risk factors for pressure ulcer development Date Initiated: 06/21/2018 Date Inactivated: 05/28/2019 Target Resolution Date: 06/01/2019 Goal Status: Met Patient/caregiver will verbalize understanding of pressure ulcer management Date Initiated: 06/01/2022 Target Resolution Date: 02/15/2023 Goal Status: Active Interventions: Assess: immobility, friction, shearing, incontinence upon admission and as needed Assess offloading mechanisms upon admission and as needed Assess potential for pressure ulcer upon admission and as needed Treatment Activities: Pressure reduction/relief device ordered : 12/05/2017 Notes: Wound/Skin Impairment Nursing Diagnoses: Impaired tissue integrity Knowledge deficit related to ulceration/compromised skin integrity Goals: Patient/caregiver will verbalize understanding of skin care regimen Date Initiated: 06/01/2022 Target Resolution Date: 02/15/2023 Goal Status: Active Ulcer/skin breakdown will have a volume reduction of 30% by week 4 Date Initiated: 06/01/2022 Date Inactivated: 06/29/2022 Target Resolution Date: 06/29/2022 Unmet Reason: complex wounds, Goal Status: Unmet infection Interventions: Assess patient/caregiver ability to obtain necessary supplies Assess ulceration(s)  every visit Provide education on ulcer and skin care Notes: 02/02/21: Complex Care, ongoing. Electronic Signature(s) Signed: 02/01/2023 4:35:56 PM By: Zenaida Deed RN, BSN Entered By: Zenaida Deed on 02/01/2023 09:02:09 -------------------------------------------------------------------------------- Pain Assessment Details Patient Name: Date of Service: Attar, [name] 02/01/2023 8:15 A M Medical Record Number: [id number] Patient Account Number: [id number] Date of Birth/Sex: Treating RN: May 15, 1986 (37 y.o. [name] Primary Care Alyssamae Klinck: [name], [name] Other Clinician: Referring Allenmichael Mcpartlin: Treating Alasha Mcguinness/Extender: Benjamine Mola, [name]: [address], Alexsis E ([id number]) [id number] Page 5 of 11 Active Problems Location of Pain Severity and Description of Pain Patient Has Paino No Site Locations Pain Management and Medication Current Pain Management: Electronic Signature(s) Signed: 03/10/2023 1:55:51 PM By: Brenton Grills Entered By: Brenton Grills on 02/01/2023 08:27:47 -------------------------------------------------------------------------------- Patient/Caregiver Education Details Patient Name: Date of Service: Dumlao, [name] 3/12/2024andnbsp8:15 A M Medical Record Number: [id number] Patient Account Number: [id number] Date of Birth/Gender: Treating RN: 03-May-1986 (37 y.o. Damaris Schooner Primary Care Physician: Eunice Blase Other Clinician: Referring Physician: Treating Physician/Extender: Fredderick Severance [name]: 27 Education Assessment Education Provided To: Patient Education Topics Provided Pressure: Methods: Explain/Verbal Responses: Reinforcements needed, State content correctly Venous: Methods: Explain/Verbal Responses: Reinforcements needed, State content correctly Wound/Skin Impairment: Methods: Explain/Verbal Responses: Reinforcements needed, State content  correctly Electronic Signature(s) Signed: 02/01/2023 4:35:56 PM By: Zenaida Deed RN, BSN Entered By: Zenaida Deed on 02/01/2023 09:02:41 Wound Assessment Details -------------------------------------------------------------------------------- Mennenga, [name] ([id number]) [id number] Page 6 of 11 Patient Name: Date of Service: Slezak, [name] 02/01/2023 8:15 A M Medical Record Number: [id number] Patient Account Number: [id number]  Date of Birth/Sex: Treating RN: [birth year] (37 y.o. [name] Primary Care Medina Degraffenreid: [name], [name] Other Clinician: Referring Silverio Hagan: Treating Bryant Saye/Extender: Benjamine Mola, [name]: 369 Wound Status Wound Number: [id number] Primary Etiology: Venous Leg Ulcer Wound Location: Right, Dorsal Foot Wound Status: Open Wounding Event: Gradually Appeared Comorbid History: Sleep Apnea, Hypertension, Paraplegia Date Acquired: 03/27/2021 Weeks Of Treatment: 96 Clustered Wound: No Photos Wound Measurements Length: (cm) 1.4 % Reduction in Area: -145% Width: (cm) 4.2 % Reduction in Volume: -145.7% Depth: (cm) 0.1 Epithelialization: Small (1-33%) Area: (cm) 4.618 Volume: (cm) 0.462 Wound Description Classification: Full Thickness Without Exposed Support Structures Wound Margin: Flat and Intact Exudate Amount: Medium Exudate Type: Serosanguineous Exudate Color: red, brown Wound Bed Granulation Amount: Small (1-33%) Exposed Structure Granulation Quality: Red Fascia Exposed: No Necrotic Amount: Large (67-100%) Fat Layer (Subcutaneous Tissue) Exposed: Yes Necrotic Quality: Adherent Slough Tendon Exposed: No Muscle Exposed: No Joint Exposed: No Bone Exposed: No Periwound Skin Texture Texture Color No Abnormalities Noted: Yes No Abnormalities Noted: No Erythema: Yes Moisture No Abnormalities Noted: No Temperature / Pain Dry / Scaly: Yes Temperature: No Abnormality Electronic  Signature(s) Signed: 03/10/2023 1:55:51 PM By: Brenton Grills Entered By: Brenton Grills on 02/01/2023 08:52:59 -------------------------------------------------------------------------------- Wound Assessment Details Patient Name: Date of Service: Cantin, [name] 02/01/2023 8:15 A M Medical Record Number: [id number] Patient Account Number: [id number] Date of Birth/Sex: Treating RN: 1986-05-22 (37 y.o. [name] Primary Care Dawnmarie Breon: Eunice Blase Other ClinicianJAXX, HUISH ([id number]) [id number] Page 7 of 11 Referring Savien Mamula: Treating Freedom Peddy/Extender: Benjamine Mola, [name]: 369 Wound Status Wound Number: 56 Primary Etiology: Neuropathic Ulcer-Non Diabetic Wound Location: Left, Dorsal Foot Wound Status: Open Wounding Event: Gradually Appeared Comorbid History: Sleep Apnea, Hypertension, Paraplegia Date Acquired: 07/11/2021 Weeks Of Treatment: 80 Clustered Wound: Yes Photos Wound Measurements Length: (cm) 3.4 Width: (cm) 6 Depth: (cm) 0.1 Area: (cm) 16.022 Volume: (cm) 1.602 % Reduction in Area: -157.6% % Reduction in Volume: -157.6% Epithelialization: Large (67-100%) Wound Description Classification: Full Thickness Without Exposed Suppo Wound Margin: Flat and Intact Exudate Amount: Small Exudate Type: Serous Exudate Color: amber rt Structures Foul Odor After Cleansing: No Slough/Fibrino Yes Wound Bed Granulation Amount: Small (1-33%) Exposed Structure Granulation Quality: Red Fascia Exposed: No Necrotic Amount: Small (1-33%) Fat Layer (Subcutaneous Tissue) Exposed: Yes Necrotic Quality: Adherent Slough Tendon Exposed: No Muscle Exposed: No Joint Exposed: No Bone Exposed: No Periwound Skin Texture Texture Color No Abnormalities Noted: Yes No Abnormalities Noted: No Erythema: No Moisture No Abnormalities Noted: No Temperature / Pain Dry / Scaly: Yes Temperature: No  Abnormality Maceration: No Electronic Signature(s) Signed: 03/10/2023 1:55:51 PM By: Brenton Grills Entered By: Brenton Grills on 02/01/2023 08:54:35 -------------------------------------------------------------------------------- Wound Assessment Details Patient Name: Date of Service: Mcmanaway, [name] 02/01/2023 8:15 A M Medical Record Number: [id number] Patient Account Number: [id number] Date of Birth/Sex: Treating RN: [birth year] (37 y.o. [name] Primary Care Charnee Turnipseed: [name], [name] Other Clinician: Referring Lilyahna Sirmon: Treating Jeno Calleros/Extender: Benjamine Mola, [name]: [address], Namir E ([id number]) [id number] Page 8 of 11 Wound Status Wound Number: 67 Primary Etiology: Venous Leg Ulcer Wound Location: Left, Lateral Lower Leg Wound Status: Open Wounding Event: Gradually Appeared Comorbid History: Sleep Apnea, Hypertension, Paraplegia Date Acquired: 06/22/2022 Weeks Of Treatment: 31 Clustered Wound: No Photos Wound Measurements Length: (cm) 1.2 Width: (cm) 0.8 Depth: (cm) 0.1 Area: (cm) 0.754 Volume: (cm) 0.075 % Reduction in Area: 86.6% % Reduction in Volume: 86.6% Epithelialization: Medium (34-66%) Wound Description Classification:  Full Thickness Without Exposed Suppor Wound Margin: Distinct, outline attached Exudate Amount: Medium Exudate Type: Serosanguineous Exudate Color: red, brown t Structures Foul Odor After Cleansing: No Slough/Fibrino No Wound Bed Granulation Amount: Large (67-100%) Exposed Structure Granulation Quality: Red Fascia Exposed: No Necrotic Amount: Small (1-33%) Fat Layer (Subcutaneous Tissue) Exposed: Yes Necrotic Quality: Adherent Slough Tendon Exposed: No Muscle Exposed: No Joint Exposed: No Bone Exposed: No Periwound Skin Texture Texture Color No Abnormalities Noted: Yes No Abnormalities Noted: No Erythema: Yes Moisture Erythema Location: Red Streaks No Abnormalities  Noted: No Erythema Change: No Change Dry / Scaly: Yes Temperature / Pain Temperature: No Abnormality Electronic Signature(s) Signed: 03/10/2023 1:55:51 PM By: Brenton Grills Entered By: Brenton Grills on 02/01/2023 08:55:38 -------------------------------------------------------------------------------- Wound Assessment Details Patient Name: Date of Service: Caver, [name] 02/01/2023 8:15 A M Medical Record Number: [id number] Patient Account Number: [id number] Date of Birth/Sex: Treating RN: 1986/11/02 (37 y.o. [name] Primary Care Wacey Zieger: [name], [name] Other Clinician: Referring Jacey Pelc: Treating Donell Sliwinski/Extender: Benjamine Mola, [name]: 369 Wound Status Wound Number: 73 Primary Etiology: Pressure Ulcer Geissinger, Rakin E ([id number]) [id number] Page 9 of 11 Wound Location: Left, Proximal, Lateral Lower Leg Wound Status: Healed - Epithelialized Wounding Event: Pressure Injury Comorbid History: Sleep Apnea, Hypertension, Paraplegia Date Acquired: 12/21/2022 Weeks Of Treatment: 4 Clustered Wound: No Photos Wound Measurements Length: (cm) Width: (cm) Depth: (cm) Area: (cm) Volume: (cm) 0 % Reduction in Area: 100% 0 % Reduction in Volume: 100% 0 Epithelialization: Large (67-100%) 0 Tunneling: No 0 Undermining: No Wound Description Classification: Category/Stage III Exudate Amount: Small Exudate Type: Serosanguineous Exudate Color: red, brown Foul Odor After Cleansing: No Slough/Fibrino No Wound Bed Granulation Amount: None Present (0%) Exposed Structure Necrotic Amount: None Present (0%) Fascia Exposed: No Fat Layer (Subcutaneous Tissue) Exposed: No Tendon Exposed: No Muscle Exposed: No Joint Exposed: No Bone Exposed: No Periwound Skin Texture Texture Color No Abnormalities Noted: Yes No Abnormalities Noted: No Erythema: No Moisture No Abnormalities Noted: Yes Temperature / Pain Temperature: No  Abnormality Electronic Signature(s) Signed: 03/10/2023 1:55:51 PM By: Brenton Grills Entered By: Brenton Grills on 02/01/2023 08:57:33 -------------------------------------------------------------------------------- Wound Assessment Details Patient Name: Date of Service: Fugere, [name] 02/01/2023 8:15 A M Medical Record Number: [id number] Patient Account Number: [id number] Date of Birth/Sex: Treating RN: March 08, 1986 (37 y.o. [name] Primary Care Talullah Abate: [name], [name] Other Clinician: Referring Genesis Paget: Treating Dasia Guerrier/Extender: Benjamine Mola, [name]: 369 Wound Status Wound Number: 74 Primary Etiology: Pressure Ulcer Wound Location: Left, Posterior Upper Leg Wound Status: Open Wounding Event: Pressure Injury Comorbid History: Sleep Apnea, Hypertension, Paraplegia Date Acquired: 01/10/2023 Weeks Of Treatment: 2 Clustered Wound: No Luff, Virgie E ([id number]) [id number] Page 10 of 11 Photos Wound Measurements Length: (cm) 9.7 Width: (cm) 6.8 Depth: (cm) 0.1 Area: (cm) 51.805 Volume: (cm) 5.18 % Reduction in Area: 30.6% % Reduction in Volume: 30.6% Epithelialization: Small (1-33%) Tunneling: No Undermining: No Wound Description Classification: Category/Stage III Wound Margin: Flat and Intact Exudate Amount: Large Exudate Type: Serosanguineous Exudate Color: red, brown Foul Odor After Cleansing: No Slough/Fibrino Yes Wound Bed Granulation Amount: Medium (34-66%) Exposed Structure Granulation Quality: Red Fascia Exposed: No Necrotic Amount: Medium (34-66%) Fat Layer (Subcutaneous Tissue) Exposed: Yes Necrotic Quality: Adherent Slough Tendon Exposed: No Muscle Exposed: No Joint Exposed: No Bone Exposed: No Periwound Skin Texture Texture Color No Abnormalities Noted: No No Abnormalities Noted: Yes Scarring: Yes Temperature / Pain Temperature: No Abnormality Moisture No Abnormalities Noted:  Yes Electronic Signature(s) Signed: 03/10/2023 1:55:51  PM By: Brenton Grills Entered By: Brenton Grills on 02/01/2023 08:58:57 -------------------------------------------------------------------------------- Wound Assessment Details Patient Name: Date of Service: [name], [name] 02/01/2023 8:15 A M Medical Record Number: [id number] Patient Account Number: [id number] Date of Birth/Sex: Treating RN: January 10, 1986 (37 y.o. [name] Primary Care Elma Limas: [name], [name] Other Clinician: Referring Yomar Mejorado: Treating Zurich [name]/Extender: Benjamine Mola, [name]: 369 Wound Status Wound Number: 75 Primary Etiology: Venous Leg Ulcer Wound Location: Right, Distal, Anterior Lower Leg Wound Status: Open Wounding Event: Gradually Appeared Comorbid History: Sleep Apnea, Hypertension, Paraplegia Date Acquired: 02/01/2023 Weeks Of Treatment: 0 Clustered Wound: No Photos Michaux, Aime E ([id number]) [id number] Page 11 of 11 Wound Measurements Length: (cm) Width: (cm) Depth: (cm) Area: (cm) Volume: (cm) 0.6 % Reduction in Area: 0.8 % Reduction in Volume: 0.1 0.377 0.038 Wound Description Classification: Full Thickness Without Exposed Support Structures Wound Margin: Distinct, outline attached Exudate Amount: Small Exudate Type: Serosanguineous Exudate Color: red, brown Foul Odor After Cleansing: No Wound Bed Granulation Amount: Small (1-33%) Exposed Structure Granulation Quality: Red Fat Layer (Subcutaneous Tissue) Exposed: Yes Necrotic Amount: None Present (0%) Periwound Skin Texture Texture Color No Abnormalities Noted: Yes No Abnormalities Noted: Yes Moisture Temperature / Pain No Abnormalities Noted: No Temperature: No Abnormality Dry / Scaly: Yes Electronic Signature(s) Signed: 03/10/2023 1:55:51 PM By: Brenton Grills Entered By: Brenton Grills on 02/01/2023  09:04:11 -------------------------------------------------------------------------------- Vitals Details Patient Name: Date of Service: Harbeson, [name] 02/01/2023 8:15 A M Medical Record Number: [id number] Patient Account Number: [id number] Date of Birth/Sex: Treating RN: 1986-06-02 (37 y.o. [name] Primary [facility] Cayleigh Paull: [name], [name] Other Clinician: Referring Jensen Kilburg: Treating Berna Gitto/Extender: Benjamine Mola, [name]: 369 Vital Signs Time Taken: 08:25 Temperature (F): 97.9 Height (in): 70 Pulse (bpm): 114 Weight (lbs): 216 Respiratory Rate (breaths/min): 18 Body Mass Index (BMI): 31 Blood Pressure (mmHg): 122/79 Reference Range: 80 - 120 mg / dl Electronic Signature(s) Signed: 03/10/2023 1:55:51 PM By: Brenton Grills Entered By: Brenton Grills on 02/01/2023 08:27:33

## 2023-03-10 NOTE — Progress Notes (Signed)
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 left anterior leg wound has less surface slough as reported by her intake nurse. Small wound at the base of the left second toe 12/02/16; patient's right leg is better and there is no open wound here. His left anterior lateral leg wound continues to have [name] healthy-looking surface. Small wound at the base of the left second toe however there is erythema in the left forefoot which is worrisome 12/16/16; is no open wounds on his right leg. We took measurements for stockings. His left anterior lateral leg wound continues to have [name] healthy-looking surface. I'm not sure where we were with the Apligraf run through his insurance. We have been using Iodoflex. He has [name] thick eschar on the left first second toe interface, I suspect this may be fungal however there is no visible open 12/23/16; no open wound on his right leg. He has 2 small areas left of the linear wound that was remaining last week. We have been using Prisma, I thought I have disclosed this week, we can only look forward to next week 01/03/17; the patient had concerning areas of erythema last week, already on doxycycline for UTI through his primary doctor. The erythema is absolutely no better there is warmth and swelling both medially from the left lateral leg wound and also the dorsal left foot. 01/06/17- Patient is here for follow-up evaluation of  his left lateral leg ulcer and bilateral feet ulcers. He is on oral antibiotic therapy, tolerating that. Nursing staff and the patient states that the erythema is improved from Monday. 01/13/17; the predominant left lateral leg wound continues to be problematic. I had put Apligraf on him earlier this month once. However he subsequently developed what appeared to be an intense cellulitis around the left lateral leg wound. I gave him Dalvance I think on 2/12 perhaps 2/13 he continues on cefdinir. The erythema is still present but the warmth and swelling is improved. I am hopeful that the cellulitis part of this control. I wouldn't be surprised if there is an element of venous inflammation as well. 01/17/17. The erythema is present but better in the left leg. His left lateral leg wound still does not have [name] viable surface buttons certain parts of this long thin wound it appears like there has been improvement in dimensions. 01/20/17; the erythema still present but much better in the left leg. I'm thinking this is his usual degree of chronic venous inflammation. The wound on the left leg looks somewhat better. Is less surface slough 01/27/17; erythema is back to the chronic venous inflammation. The wound on the left leg is somewhat better. I am back to the point where I like to try an Apligraf once again 02/10/17; slight improvement in wound dimensions. Apligraf #2. He is completing his doxycycline 02/14/17; patient arrives today having completed doxycycline last Thursday. This was supposed to be [name] nurse visit however once again he hasn't tense erythema from the medial part of his wound extending over the lower leg. Also erythema in his foot this is roughly in the  same distribution as last time. He has baseline chronic venous inflammation however this is [name] lot worse than the baseline I have learned to accept the on him is baseline inflammation 02/24/17- patient is here for follow-up evaluation. He is tolerating  compression therapy. His voicing no complaints or concerns he is here anticipating an Apligraf 03/03/17; he arrives today with an adherent necrotic surface. I don't think this is surface is going to be amenable for Apligraf's. The erythema around his wound and on the left dorsal foot has resolved he is off antibiotics 03/10/17; better-looking surface today. I don't think he can tolerate Apligraf's. He tells me he had [name] wound VAC after [name] skin graft years ago to this area and they had difficulty with [name] seal. The erythema continues to be stable around this some degree of chronic venous inflammation but he also has recurrent cellulitis. We have been using Iodoflex 03/17/17; continued improvement in the surface and may be small changes in dimensions. Using Iodoflex which seems the only thing that will control his surface 03/24/17- He is here for follow up evaluation of his LLE lateral ulceration and ulcer to right dorsal foot/toe space. He is voicing no complaints or concerns, He is tolerating compression wrap. 03/31/17 arrives today with [name] much healthier looking wound on the left lower extremity. We have been using Iodoflex for [name] prolonged period of time which has for the first time prepared and adequate looking wound bed although we have not had much in the way of wound dimension improvement. He also has [name] small wound between the first and second toe on the right 04/07/17; arrives today with [name] healthy-looking wound bed and at least the top 50% of this wound appears to be now her. No debridement was required I have changed him to [facility] last week after prolonged Iodoflex. He did not do well with Apligraf's. We've had [name] re-opening between the first and second toe on the right 04/14/17; arrives today with [name] healthier looking wound bed contractions and the top 50% of this wound and some on the lesser 50%. Wound bed appears healthy. The area between the first and second toe on the right still remains  problematic 04/21/17; continued very gradual improvement. Using [facility] [place] 04/28/17; continued very gradual improvement in the left lateral leg venous insufficiency wound. His periwound erythema is very mild. We have been using Hydrofera Blue. Wound is making progress especially in the superior 50% 05/05/17; he continues to have very gradual improvement in the left lateral venous insufficiency wound. Both in terms with an length rings are improving. I debrided this every 2 weeks with #5 curet and we have been using Hydrofera Blue and again making good progress With regards to the wounds between his right first and second toe which I thought might of been tinea pedis he is not making as much progress very dry scaly skin over the area. Also the area at the base of the left first and second toe in [name] similar condition 05/12/17; continued gradual improvement in the refractory left lateral venous insufficiency wound on the left. Dimension smaller. Surface still requiring debridement using Hydrofera Blue 05/19/17; continued gradual improvement in the refractory left lateral venous ulceration. Careful inspection of the wound bed underlying rumination suggested some degree of epithelialization over the surface no debridement indicated. Continue Hydrofera Blue difficult areas between his toes first and third on the left than first and second on the right. I'm going to change to silver alginate from silver collagen.  Continue ketoconazole as I suspect underlying tinea pedis 05/26/17; left lateral leg venous insufficiency wound. We've been using Hydrofera Blue. I believe that there is expanding epithelialization over the surface of the wound albeit not coming from the wound circumference. This is [name] bit of an odd situation in which the epithelialization seems to be coming from the surface of the wound rather than in the exact circumference. There is still small open areas mostly along the lateral margin of the  wound. He has unchanged areas between the left first and second and the right first second toes which I been treating for tenia pedis 06/02/17; left lateral leg venous insufficiency wound. We have been using Hydrofera Blue. Somewhat smaller from the wound circumference. The surface of the wound remains [name] bit on it almost epithelialized sedation in appearance. I use an open curette today debridement in the surface of all of this especially the edges Small open wounds remaining on the dorsal right first and second toe interspace and the plantar left first second toe and her face on the left 06/09/17; wound on the left lateral leg continues to be smaller but very gradual and very dry surface using Hydrofera Blue 06/16/17 requires weekly debridements now on the left lateral leg although this continues to contract. I changed to silver collagen last week because of dryness of the wound bed. Using Iodoflex to the areas on his first and second toes/web space bilaterally 06/24/17; patient with history of paraplegia also chronic venous insufficiency with lymphedema. Has [name] very difficult wound on the left lateral leg. This has been gradually reducing in terms of with but comes in with [name] very dry adherent surface. High switch to silver collagen [name] week or so ago with hydrogel to keep the area moist. This is been refractory to multiple dressing attempts. He also has areas in his first and second toes bilaterally in the anterior and posterior web space. I had been using Iodoflex here after [name] prolonged course of silver alginate with ketoconazole was ineffective [question tinea pedis] 07/14/17; patient arrives today with [name] very difficult adherent material over his left lateral lower leg wound. He also has surrounding erythema and poorly controlled edema. He was switched his Santyl last visit which the nurses are applying once during his doctor visit and once on [name] nurse visit. He was also reduced to 2 layer compression I'm  not exactly sure of the issue here. 07/21/17; better surface today after 1 week of Iodoflex. Significant cellulitis that we treated last week also better. [Doxycycline] 07/28/17 better surface today with now 2 weeks of Iodoflex. Significant cellulitis treated with doxycycline. He has now completed the doxycycline and he is back to his usual degree of chronic venous inflammation/stasis dermatitis. He reminds me he has had ablations surgery here 08/04/17; continued improvement with Iodoflex to the left lateral leg wound in terms of the surface of the wound although the dimensions are better. 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 Problematic areas on the plantar aspect of the first second toe web space on the left and the dorsal aspect of the first second toe web space on the right. At one point I felt these were probably related to chronic fungal infections in treated him aggressively for this although we have not made any improvement here. [name], [name] ([id number]) [id number] Page 7 of 38 08/11/17; left lateral leg. Surface continues to improve with the Iodoflex although we are not seeing  much improvement in overall wound dimensions. Areas on his plantar left foot and right foot show no improvement. In fact the right foot looks somewhat worse 08/18/17; left lateral leg. We changed to [facility] Blue last week after [name] prolonged course of Iodoflex which helps get the surface better. It appears that the wound with is improved. Continue with difficult areas on the left dorsal first second and plantar first second on the right 09/01/17; patient arrives in clinic today having had [name] temperature of 103 yesterday. He was seen in the ER and [facility]. The patient was concerned he could have cellulitis again in the right leg however they diagnosed him with [name] UTI and he is now on Keflex. He has [name] history of cellulitis  which is been recurrent and difficult but this is been in the left leg, in the past 5 use doxycycline. He does in and out catheterizations at home which are risk factors for UTI 09/08/17; patient will be completing his Keflex this weekend. The erythema on the left leg is considerably better. He has [name] new wound today on the medial part of the right leg small superficial almost looks like [name] skin tear. He has worsening of the area on the right dorsal first and second toe. His major area on the left lateral leg is better. Using Hydrofera Blue on all areas 09/15/17; gradual reduction in width on the long wound in the left lateral leg. No debridement required. He also has wounds on the plantar aspect of his left first second toe web space and on the dorsal aspect of the right first second toe web space. 09/22/17; there continues to be very gradual improvements in the dimensions of the left lateral leg wound. He hasn't round erythematous spot with might be pressure on his wheelchair. There is no evidence obviously of infection no purulence no warmth He has [name] dry scaled area on the plantar aspect of the left first second toe Improved area on the dorsal right first second toe. 09/29/17; left lateral leg wound continues to improve in dimensions mostly with an is still [name] fairly long but increasingly narrow wound. He has [name] dry scaled area on the plantar aspect of his left first second toe web space Increasingly concerning area on the dorsal right first second toe. In fact I am concerned today about possible cellulitis around this wound. The areas extending up his second toe and although there is deformities here almost appears to abut on the nailbed. 10/06/17; left lateral leg wound continues to make very gradual progress. Tissue culture I did from the right first second toe dorsal foot last time grew MRSA and enterococcus which was vancomycin sensitive. This was not sensitive to clindamycin or doxycycline. He is  allergic to Zyvox and sulfa we have therefore arrange for him to have dalvance infusion tomorrow. He is had this in the past and tolerated it well 10/20/17; left lateral leg wound continues to make decent progress. This is certainly reduced in terms of with there is advancing epithelialization.The cellulitis in the right foot looks better although he still has [name] deep wound in the dorsal aspect of the first second toe web space. Plantar left first toe web space on the left I think is making some progress 10/27/17; left lateral leg wound continues to make decent progress. Advancing epithelialization.using Hydrofera Blue The right first second toe web space wound is better-looking using silver alginate Improvement in the left plantar first second toe web space. Again using silver alginate 11/03/17  left lateral leg wound continues to make decent progress albeit slowly. Using [facility] The right per second toe web space continues to be [name] very problematic looking punched out wound. I obtained [name] piece of tissue for deep culture I did extensively treated this for fungus. It is difficult to imagine that this is [name] pressure area as the patient states other than going outside he doesn't really wear shoes at home The left plantar first second toe web space looked fairly senescent. Necrotic edges. This required debridement change to [facility] Blue to all wound areas 11/10/17; left lateral leg wound continues to contract. Using Hydrofera Blue On the right dorsal first second toe web space dorsally. Culture I did of this area last week grew MRSA there is not an easy oral option in this patient was multiple antibiotic allergies or intolerances. This was only [name] rare culture isolate I'm therefore going to use Bactroban under silver alginate On the left plantar first second toe web space. Debridement is required here. This is also unchanged 11/17/17; left lateral leg wound continues to contract using Hydrofera  Blue this is no longer the major issue. The major concern here is the right first second toe web space. He now has an open area going from dorsally to the plantar aspect. There is now wound on the inner lateral part of the first toe. Not [name] very viable surface on this. There is erythema spreading medially into the forefoot. No major change in the left first second toe plantar wound 11/24/17; left lateral leg wound continues to contract using Hydrofera Blue. Nice improvement today The right first second toe web space all of this looks [name] lot less angry than last week. I have given him clindamycin and topical Bactroban for MRSA and terbinafine for the possibility of underlining tinea pedis that I could not control with ketoconazole. Looks somewhat better The area on the plantar left first second toe web space is weeping with dried debris around the wound 12/01/17; left lateral leg wound continues to contract he Hydrofera Blue. It is becoming thinner in terms of with nevertheless it is making good improvement. The right first second toe web space looks less angry but still [name] large necrotic-looking wounds starting on the plantar aspect of the right foot extending between the toes and now extensively on the base of the right second toe. I gave him clindamycin and topical Bactroban for MRSA anterior benefiting for the possibility of underlying tinea pedis. Not looking better today The area on the left first/second toe looks better. Debrided of necrotic debris 12/05/17* the patient was worked in urgently today because over the weekend he found blood on his incontinence bad when he woke up. He was found to have an ulcer by his wife who does most of his wound care. He came in today for [place] to look at this. He has not had [name] history of wounds in his buttocks in spite of his paraplegia. 12/08/17; seen in follow-up today at his usual appointment. He was seen earlier this week and found to have [name] new wound on his buttock.  We also follow him for wounds on the left lateral leg, left first second toe web space and right first second toe web space 12/15/17; we have been using Hydrofera Blue to the left lateral leg which has improved. The right first second toe web space has also improved. Left first second toe web space plantar aspect looks stable. The left buttock has worsened using Santyl. Apparently the  buttock has drainage 12/22/17; we have been using Hydrofera Blue to the left lateral leg which continues to improve now 2 small wounds separated by normal skin. He tells [place] he had [name] fever up to 100 yesterday he is prone to UTIs but has not noted anything different. He does in and out catheterizations. The area between the first and second toes today does not look good necrotic surface covered with what looks to be purulent drainage and erythema extending into the third toe. I had gotten this to something that I thought look better last time however it is not look good today. He also has [name] necrotic surface over the buttock wound which is expanded. I thought there might be infection under here so I removed [name] lot of the surface with [name] #5 curet though nothing look like it really needed culturing. He is been using Santyl to this area 12/27/17; his original wound on the left lateral leg continues to improve using Hydrofera Blue. I gave him samples of Baxdella although he was unable to take them out of fear for an allergic reaction ["lump in his throat"].the culture I did of the purulent drainage from his second toe last week showed both enterococcus and [name] set Enterobacter I was also concerned about the erythema on the bottom of his foot although paradoxically although this looks somewhat better today. 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 We've been using silver alginate here. They came up today that he sleeps in [name] recliner, I'm not  sure why but I asked him to stop this 01/03/18; his original wound we've been using Hydrofera Blue is now separated into 2 areas. Ulcer on his left buttock is better he is off the recliner and sleeping in bed Finally both wound areas between his first and second toes also looks some better 01/10/18; his original wound on the left lateral leg is now separated into 2 wounds we've been using Hydrofera Blue Ulcer on his left buttock has some drainage. There is [name] small probing site going into muscle layer superiorly.using silver alginate -He arrives today with [name] deep tissue injury on the left heel The wound on the dorsal aspect of his first second toe on the left looks [name] lot betterusing silver alginate ketoconazole The area on the first second toe web space on the right also looks [name] lot bette 01/17/18; his original wound on the left lateral leg continues to progress using Hydrofera Blue Ulcer on his left buttock also is smaller surface healthier except for [name] small probing site going into the muscle layer superiorly. 2.4 cm of tunneling in this area DTI on his left heel we have only been offloading. Looks better than last week no threatened open no evidence of infection the wound on the dorsal aspect of the first second toe on the left continues to look like it's regressing we have only been using silver alginate and terbinafine orally The area in the first second toe web space on the right also looks to be [name] lot better using silver alginate and terbinafine I think this was prompted by tinea pedis Cybulski, Yavuz [name] ([id number]) [id number] Page 8 of 38 01/31/18; the patient was hospitalized in [place] last week apparently for [name] complicated UTI. He was discharged on cefepime he does in and out catheterizations. In the hospital he was discovered M I don't mild elevation of AST and ALT and the terbinafine  was stopped.predictably the pressure ulcer on s his buttock looks betterusing  silver alginate. The area on the left lateral leg also is better using Hydrofera Blue. The area between the first and second toes on the left better. First and second toes on the right still substantial but better. Finally the DTI on the left heel has held together and looks like it's resolving 02/07/18-he is here in follow-up evaluation for multiple ulcerations. He has new injury to the lateral aspect of the last issue [name] pressure ulcer, he states this is from adhesive removal trauma. He states he has tried multiple adhesive products with no success. All other ulcers appear stable. The left heel DTI is resolving. We will continue with same treatment plan and follow-up next week. 02/14/18; follow-up for multiple areas. He has [name] new area last week on the lateral aspect of his pressure ulcer more over the posterior trochanter. The original pressure ulcer looks quite stable has healthy granulation. 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 Almost fully epithelialized on the original superior area using Hydrofera Blue DTI on the left heel has peeled off this week to reveal [name] small superficial wound under denuded skin and subcutaneous tissue Both areas between the first and second toes look better including nothing open on the left 02/21/18; The patient's wounds on his left ischial tuberosity and posterior left greater trochanter actually looked better. He has [name] large area of irritation around the area which I think is contact dermatitis. I am doubtful that this is fungal His original wound on the left lateral calf continues to improve we have been using Hydrofera Blue There is no open area in the left first second toe web space although there is [name] lot of thick callus The DTI on the left heel required debridement today of necrotic surface eschar and subcutaneous tissue using silver alginate Finally the  area on the right first second toe webspace continues to contract using silver alginate and ketoconazole 02/28/18 Left ischial tuberosity wounds look better using silver alginate. Original wound on the left calf only has one small open area left using Hydrofera Blue DTI on the left heel required debridement mostly removing skin from around this wound surface. Using silver alginate The areas on the right first/second toe web space using silver alginate and ketoconazole 03/08/18 on evaluation today patient appears to be doing decently well as best I can tell in regard to his wounds. This is the first time that I have seen him as he generally is followed by Dr. Leanord Hawking. With that being said none of his wounds appear to be infected he does have an area where there is some skin covering what appears to be [name] new wound on the left dorsal surface of his great toe. This is right at the nail bed. With that being said I do believe that debrided away some of the excess skin can be of benefit in this regard. Otherwise he has been tolerating the dressing changes without complication. 03/14/18; patient arrives today with the multiplicity of wounds that we are following. He has not been systemically unwell Original wound on the left lateral calf now only has 2 small open areas we've been using Hydrofera Blue which should continue The deep tissue injury on the left heel requires debridement today. We've been using silver alginate The left first second toe and the right first second toe are both are reminiscence  what I think was tinea pedis. Apparently some of the callus Surface between the toes was removed last week when it started draining. Purulent drainage coming from the wound on the ischial tuberosity on the left. 03/21/18-He is here in follow-up evaluation for multiple wounds. 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 He tolerated  debridement. The only change to last week's recommendations is to discontinue antifungal cream between toes. He will follow-up next week 03/28/18; following up for multiple wounds;Concern this week is streaking redness and swelling in the right foot. He is going to need antibiotics for this. 03/31/18; follow-up for right foot cellulitis. Streaking redness and swelling in the right foot on 03/28/18. 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 I put him on clindamycin 300 mg every 6 and brought him in for [name] quick check. He has an open wound between his first and second toes on the right foot as [name] potential source. 04/04/18; Right foot cellulitis is resolving he is completing clindamycin. This is truly good news Left lateral calf wound which is initial wound only has one small open area inferiorly this is close to healing out. He has compression stockings. We will use Hydrofera Blue right down to the epithelialization of this Nonviable surface on the left heel which was initially pressure with [name] DTI. We've been using Hydrofera Blue. I'm going to switch this back to silver alginate Left first second toe/tinea pedis this looks better using silver alginate Right first second toe tinea pedis using silver alginate Large pressure ulcers on theLeft ischial tuberosity. Small wound here Looks better. I am uncertain about the surface over the large wound. Using silver alginate 04/11/18; Cellulitis in the right foot is resolved Left lateral calf wound which was his original wounds still has 2 tiny open areas remaining this is just about closed Nonviable surface on the left heel is better but still requires debridement Left first second toe/tinea pedis still open using silver alginate Right first second toe wound tinea pedis I asked him to go back to using ketoconazole and silver alginate Large pressure ulcers on the left ischial tuberosity this shear injury here is resolved. Wound is smaller. No  evidence of infection using silver alginate 04/18/18; Patient arrives with an intense area of cellulitis in the right mid lower calf extending into the right heel area. Bright red and warm. Smaller area on the left anterior leg. He has [name] significant history of MRSA. He will definitely need antibioticsdoxycycline He now has 2 open areas on the left ischial tuberosity the original large wound and now [name] satellite area which I think was above his initial satellite areas. Not [name] wonderful surface on this satellite area surrounding erythema which looks like pressure related. His left lateral calf wound again his original wound is just about closed Left heel pressure injury still requiring debridement Left first second toe looks [name] lot better using silver alginate Right first second toe also using silver alginate and ketoconazole cream also looks better 04/20/18; the patient was worked in early today out of concerns with his cellulitis on the right leg. I had started him on doxycycline. This was 2 days ago. His wife was concerned about the swelling in the area. Also concerned about the left buttock. He has not been systemically unwell no fever chills. No nausea vomiting or diarrhea 04/25/18; the patient's left buttock wound is continued to deteriorate he is using Hydrofera Blue. He is still  completing clindamycin for the cellulitis on the right leg although all of this looks better. 05/02/18 Left buttock wound still with [name] lot of drainage and [name] very tightly adherent fibrinous necrotic surface. He has [name] deeper area superiorly The left lateral calf wound is still closed DTI wound on the left heel necrotic surface especially the circumference using Iodoflex Areas between his left first second toe and right first second toe both look better. Dorsally and the right first second toe he had [name] necrotic surface although at smaller. In using silver alginate and ketoconazole. I did [name] culture last week which was [name] deep  tissue culture of the reminiscence of the open wound on the right first second toe dorsally. This grew [name] few Acinetobacter and [name] few methicillin-resistant staph aureus. Nevertheless the area actually this week looked better. I didn't feel the need to specifically address this at least in terms of systemic antibiotics. 05/09/18; wounds are measuring larger more drainage per our intake. We are using Santyl covered with alginate on the large superficial buttock wounds, Iodosorb on the left heel, ketoconazole and silver alginate to the dorsal first and second toes bilaterally. 05/16/18; The area on his left buttock better in some aspects although the area superiorly over the ischial tuberosity required an extensive debridement.using Santyl Left heel appears stable. Using Iodoflex The areas between his first and second toes are not bad however there is spreading erythema up the dorsal aspect of his left foot this looks like cellulitis again. He is insensate the erythema is really very brilliant.o Erysipelas Maron, Kelen [name] ([id number]) [phone].pdf Page 9 of 38 He went to see an allergist days ago because he was itching part of this he had lab work done. This showed [name] white count of 15.1 with 70% neutrophils. Hemoglobin of 11.4 and [name] platelet count of 659,000. Last white count we had in Epic was [name] 2-1/2 years ago which was 25.9 but he was ill at the time. He was able to show me some lab work that was done by his primary physician the pattern is about the same. 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 But prompted me to go ahead and do x-rays of both feet and the pelvis rule out osteomyelitis. He also had [name] comprehensive metabolic panel this was reasonably normal his albumin was 3.7 liver function tests BUN/creatinine all normal 05/23/18; x-rays of both his feet from last week were negative for underlying pulmonary abnormality. The x-ray of  his pelvis however showed mild irregularity in the left ischial which may represent some early osteomyelitis. The wound in the left ischial continues to get deeper clearly now exposed muscle. Each week necrotic surface material over this area. Whereas the rest of the wounds do not look so bad. The left ischial wound we have been using Santyl and calcium alginate T the left heel surface necrotic debris using Iodoflex o The left lateral leg is still healed Areas on the left dorsal foot and the right dorsal foot are about the same. There is some inflammation on the left which might represent contact dermatitis, fungal dermatitis I am doubtful cellulitis although this looks better than last week 05/30/18; CT scan done at Hospital did not show any osteomyelitis or abscess. Suggested the possibility of underlying cellulitis although I don't see [name] lot of evidence of this at the bedside The wound itself on the left buttock/upper thigh actually looks somewhat better. No debridement Left heel also  looks better no debridement continue Iodoflex Both dorsal first second toe spaces appear better using Lotrisone. Left still required debridement 06/06/18; Intake reported some purulent looking drainage from the left gluteal wound. Using Santyl and calcium alginate Left heel looks better although still [name] nonviable surface requiring debridement The left dorsal foot first/second webspace actually expanding and somewhat deeper. I may consider doing [name] shave biopsy of this area Right dorsal foot first/second webspace appears stable to improved. Using Lotrisone and silver alginate to both these areas 06/13/18 Left gluteal surface looks better. Now separated in the 2 wounds. No debridement required. Still drainage. We'll continue silver alginate Left heel continues to look better with Iodoflex continue this for at least another week Of his dorsal foot wounds the area on the left still has some depth although it looks  better than last week. We've been using Lotrisone and silver alginate 06/20/18 Left gluteal continues to look better healthy tissue Left heel continues to look better healthy granulation wound is smaller. He is using Iodoflex and his long as this continues continue the Iodoflex Dorsal right foot looks better unfortunately dorsal left foot does not. There is swelling and erythema of his forefoot. He had minor trauma to this several days ago but doesn't think this was enough to have caused any tissue injury. Foot looks like cellulitis, we have had this problem before 06/27/18 on evaluation today patient appears to be doing [name] little worse in regard to his foot ulcer. 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 Both of which would really be his only options for treating this infection. In the past he has been given and effusion of Orbactiv. This is done very well for him in the past again it's one time dosing IV antibiotic therapy. Subsequently I do believe this is something we're gonna need to see about doing at this point in time. Currently his other wounds seem to be doing somewhat better in my pinion I'm pretty happy in that regard. 07/03/18 on evaluation today patient's wounds actually appear to be doing fairly well. He has been tolerating the dressing changes without complication. All in all he seems to be showing signs of improvement. In regard to the antibiotics he has been dealing with infectious disease since I saw him last week as far as getting this scheduled. In the end [facility]'s [facility] this coming Friday. In the meantime he has been continuing to perform the dressing changes in such as previous. There does not appear to be any evidence of infection worsengin at this time. 07/10/18; Since I last saw  this man 2 weeks ago things have actually improved. IV antibiotics of resulted in less forefoot erythema although there is still some present. He is not systemically unwell Left buttock wounds 2 now have no depth there is increased epithelialization Using silver alginate Left heel still requires debridement using Iodoflex Left dorsal foot still with [name] sizable wound about the size of [name] border but healthy granulation Right dorsal foot still with [name] slitlike area using silver alginate 07/18/18; the patient's cellulitis in the left foot is improved in fact I think it is on its way to resolving. Left buttock wounds 2 both look better although the larger one has hypertension granulation we've been using silver alginate Left heel has some  thick circumferential redundant skin over the wound edge which will need to be removed today we've been using Iodoflex Left dorsal foot is still [name] sizable wound required debridement using silver alginate The right dorsal foot is just about closed only [name] small open area remains here 07/25/18; left foot cellulitis is resolved Left buttock wounds 2 both look better. Hyper-granulation on the major area Left heel as some debris over the surface but otherwise looks [name] healthier wound. Using silver collagen Right dorsal foot is just about closed 07/31/18; arrives with our intake nurse worried about purulent drainage from the buttock. We had hyper-granulation here last week His buttock wounds 2 continue to look better Left heel some debris over the surface but measuring smaller. Right dorsal foot unfortunately has openings between the toes Left foot superficial wound looks less aggravated. 08/07/18 Buttock wounds continue to look better although some of her granulation and the larger medial wound. silver alginate Left heel continues to look [name] lot better.silver collagen Left foot superficial wound looks less stable. Requires debridement. He has [name] new wound superficial area on the  foot on the lateral dorsal foot. Right foot looks better using silver alginate without Lotrisone 08/14/2018; patient was in the ER last week diagnosed with [name] UTI. He is now on Cefpodoxime and Macrodantin. Buttock wounds continued to be smaller. Using silver alginate Left heel continues to look better using silver collagen Left foot superficial wound looks as though it is improving Right dorsal foot area is just about healed. 08/21/2018; patient is completed his antibiotics for his UTI. He has 2 open areas on the buttocks. There is still not closed although the surface looks satisfactory. Using silver alginate Left heel continues to improve using silver collagen The bilateral dorsal foot areas which are at the base of his first and second toes/possible tinea pedis are actually stable on the left but worse on the right. The area on the left required debridement of necrotic surface. After debridement I obtained [name] specimen for PCR culture. The right dorsal foot which is been just about healed last week is now reopened 08/28/2018; culture done on the left dorsal foot showed coag negative staph both staph epidermidis and Lugdunensis. I think this is worthwhile initiating systemic treatment. I will use doxycycline given his long list of allergies. The area on the left heel slightly improved but still requiring debridement. The large wound on the buttock is just about closed whereas the smaller one is larger. Using silver alginate in this area 09/04/2018; patient is completing his doxycycline for the left foot although this continues to be [name] very difficult wound area with very adherent necrotic debris. We are using silver alginate to all his wounds right foot left foot and the small wounds on his buttock, silver collagen on the left heel. 09/11/2018; once again this patient has intense erythema and swelling of the left forefoot. Lesser degrees of erythema in the right foot. He has [name] long list of allergies  and intolerances. I will reinstitute doxycycline. 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 Using silver alginate Candela, Khair [name] ([id number]) [id number] Page 10 of 38 Left heel also looks better using silver collagen Unfortunately both the areas on his feet look worse. The area on the left first second webspace is now gone through to the plantar part of his foot. The area on the left foot anteriorly is irritated with erythema and swelling in the forefoot. 09/25/2018 His  wound on the left plantar heel looks better. Using silver collagen The area on the left buttock 2 small remnant areas. One is closed one is still open. Using silver alginate The areas between both his first and second toes look worse. This in spite of long-standing antifungal therapy with ketoconazole and silver alginate which should have antifungal activity He has small areas around his original wound on the left calf one is on the bottom of the original scar tissue and one superiorly both of these are small and superficial but again given wound history in this site this is worrisome 10/02/2018 Left plantar heel continues to gradually contract using silver collagen Left buttock wound is unchanged using silver alginate The areas on his dorsal feet between his first and second toes bilaterally look about the same. I prescribed clindamycin ointment to see if we can address chronic staph colonization and also the underlying possibility of erythrasma The left lateral lower extremity wound is actually on the lateral part of his ankle. Small open area here. We have been using silver alginate 10/09/2018; Left plantar heel continues to look healthy and contract. No debridement is required Left buttock slightly smaller with [name] tape injury wound just below which was new this week Dorsal feet somewhat improved I have been using clindamycin Left lateral looks lower  extremity the actual open area looks worse although [name] lot of this is epithelialized. I am going to change to silver collagen today He has [name] lot more swelling in the right leg although this is not pitting not red and not particularly warm there is [name] lot of spasm in the right leg usually indicative of people with paralysis of some underlying discomfort. We have reviewed his vascular status from 2017 he had [name] left greater saphenous vein ablation. I wonder about referring him back to vascular surgery if the area on the left leg continues to deteriorate. 10/16/2018 in today for follow-up and management of multiple lower extremity ulcers. His left Buttock wound is much lower smaller and almost closed completely. The wound to the left ankle has began to reopen with Epithelialization and some adherent slough. He has multiple new areas to the left foot and leg. The left dorsal foot without much improvement. Wound present between left great webspace and 2nd toe. Erythema and edema present right leg. Right LE ultrasound obtained on 10/10/18 was negative for DVT . 10/23/2018; Left buttock is closed over. Still dry macerated skin but there is no open wound. I suspect this is chronic pressure/moisture Left lateral calf is quite [name] bit worse than when I saw this last. There is clearly drainage here he has macerated skin into the left plantar heel. We will change the primary dressing to alginate Left dorsal foot has some improvement in overall wound area. Still using clindamycin and silver alginate Right dorsal foot about the same as the left using clindamycin and silver alginate The erythema in the right leg has resolved. He is DVT rule out was negative Left heel pressure area required debridement although the wound is smaller and the surface is health 10/26/2018 The patient came back in for his nurse check today predominantly because of the drainage coming out of the left lateral leg with [name] recent reopening of  his original wound on the left lateral calf. He comes in today with [name] large amount of surrounding erythema around the wound extending from the calf into the ankle and even in the area on the dorsal foot. He is not systemically  unwell. He is not febrile. Nevertheless this looks like cellulitis. We have been using silver alginate to the area. I changed him to [name] regular visit and I am going to prescribe him doxycycline. The rationale here is [name] long list of medication intolerances and [name] history of MRSA. I did not see anything that I thought would provide [name] valuable culture 10/30/2018 Follow-up from his appointment 4 days ago with really an extensive area of cellulitis in the left calf left lateral ankle and left dorsal foot. I put him on doxycycline. He has [name] long list of medication allergies which are true allergy reactions. 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 In any case he is [name] lot better today. The erythema especially in the anterior and lateral left calf is better. He still has left ankle erythema. He also is complaining about increasing edema in the right leg we have only been using Kerlix Coban and he has been doing the wraps at home. Finally he has [name] spotty rash on the medial part of his upper left calf which looks like folliculitis or perhaps wrap occlusion type injury. Small superficial macules not pustules 11/06/18 patient arrives today with again [name] considerable degree of erythema around the wound on the left lateral calf extending into the dorsal ankle and dorsal foot. This is [name] lot worse than when I saw this last week. He is on doxycycline really with not [name] lot of improvement. He has not been systemically unwell Wounds on the; left heel actually looks improved. Original area on the left foot and proximity to the first and second toes looks about the same. He has superficial areas on the dorsal foot, anterior calf and  then the reopening of his original wound on the left lateral calf which looks about the same The only area he has on the right is the dorsal webspace first and second which is smaller. He has [name] large area of dry erythematous skin on the left buttock small open area here. 11/13/2018; the patient arrives in much better condition. The erythema around the wound on the left lateral calf is [name] lot better. Not sure whether this was the clindamycin or the TCA and ketoconazole or just in the improvement in edema control [stasis dermatitis]. In any case this is [name] lot better. The area on the left heel is very small and just about resolved using silver collagen we have been using silver alginate to the areas on his dorsal feet 11/20/2018; his wounds include the left lateral calf, left heel, dorsal aspects of both feet just proximal to the first second webspace. He is stable to slightly improved. I did not think any changes to his dressings were going to be necessary 11/27/2018 he has [name] reopening on the left buttock which is surrounded by what looks like tinea or perhaps some other form of dermatitis. The area on the left dorsal foot has some erythema around it I have marked this area but I am not sure whether this is cellulitis or not. Left heel is not closed. Left calf the reopening is really slightly longer and probably worse 1/13; in general things look better and smaller except for the left dorsal foot. Area on the left heel is just about closed, left buttock looks better only [name] small wound remains in the skin looks better [using Lotrisone] 1/20; the area on the left heel only has [name] few remaining open areas here. Left  lateral calf about the same in terms of size, left dorsal foot slightly larger right lateral foot still not closed. The area on the left buttock has no open wound and the surrounding skin looks [name] lot better 1/27; the area on the left heel is closed. Left lateral calf better but still requiring  extensive debridements. The area on his left buttock is closed. He still has the open areas on the left dorsal foot which is slightly smaller in the right foot which is slightly expanded. We have been using Iodoflex on these areas as well 2/3; left heel is closed. Left lateral calf still requiring debridement using Iodoflex there is no open area on his left buttock however he has dry scaly skin over [name] large area of this. Not really responding well to the Lotrisone. Finally the areas on his dorsal feet at the level of the first second webspace are slightly smaller on the right and about the same on the left. Both of these vigorously debrided with Anasept and gauze 2/10; left heel remains closed he has dry erythematous skin over the left buttock but there is no open wound here. Left lateral leg has come in and with. Still requiring debridement we have been using Iodoflex here. Finally the area on the left dorsal foot and right dorsal foot are really about the same extremely dry callused fissured areas. He does not yet have [name] dermatology appointment 2/17; left heel remains closed. He has [name] new open area on the left buttock. The area on the left lateral calf is bigger longer and still covered in necrotic debris. No major change in his foot areas bilaterally. I am awaiting for [name] dermatologist to look on this. We have been using ketoconazole I do not know that this is been doing any good at all. 2/24; left heel remains closed. The left buttock wound that was new reopening last week looks better. The left lateral calf appears better also although still requires debridement. The major area on his foot is the left first second also requiring debridement. We have been putting Prisma on all wounds. I do not believe that the ketoconazole has done too much good for his feet. He will use Lotrisone I am going to give him [name] 2-week course of terbinafine. We still do not have [name] dermatology appointment 3/2 left heel  remains closed however there is skin over bone in this area I pointed this out to him today. The left buttock wound is epithelialized but still does not look completely stable. The area on the left leg required debridement were using silver collagen here. With regards to his feet we changed to Lotrisone last week and silver alginate. 3/9; left heel remains closed. Left buttock remains closed. The area on the right foot is essentially closed. The left foot remains unchanged. Slightly smaller on Bernasconi, Ainsley [name] ([id number]) [phone].pdf Page 11 of 38 the left lateral calf. Using silver collagen to both of these areas 3/16-Left heel remains closed. Area on right foot is closed. Left lateral calf above the lateral malleolus open wound requiring debridement with easy bleeding. Left dorsal wound proximal to first toe also debrided. Left ischial area open new. Patient has been using Prisma with wrapping every 3 days. Dermatology appointment is apparently tomorrow.Patient has completed his terbinafine 2-week course with some apparent improvement according to him, there is still flaking and dry skin in his foot on the left 3/23; area on the right foot is reopened. The area on the left  anterior foot is about the same still [name] very necrotic adherent surface. He still has the area on the left leg and reopening is on the left buttock. He apparently saw dermatology although I do not have [name] note. According to the patient who is usually fairly well informed they did not have any good ideas. Put him on oral terbinafine which she is been on before. 3/30; using silver collagen to all wounds. Apparently his dermatologist put him on doxycycline and rifampin presumably some culture grew staph. I do not have this result. He remains on terbinafine although I have used terbinafine on him before 4/6; patient has had [name] fairly substantial reopening on the right foot between the first and second toes. He  is finished his terbinafine and I believe is on doxycycline and rifampin still as prescribed by dermatology. We have been using silver collagen to all his wounds although the patient reports that he thinks silver alginate does better on the wounds on his buttock. 4/13; the area on his left lateral calf about the same size but it did not require debridement. Left dorsal foot just proximal to the webspace between the first and second toes is about the same. Still nonviable surface. I note some superficial bronze discoloration of the dorsal part of his foot Right dorsal foot just proximal to the first and second toes also looks about the same. I still think there may be the same discoloration I noted above on the left Left buttock wound looks about the same 4/20; left lateral calf appears to be gradually contracting using silver collagen. 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 The left dorsal foot wound is debrided of tightly adherent necrotic debris and really cleans up quite nicely. The right area is worse with expansion. I did not debride this it is now over the base of the second toe The area on his left buttock is smaller no debridement is required using silver collagen 5/4; left calf continues to make good progress. He arrives with erythema around the wounds on his dorsal foot which even extends to the plantar aspect. Very concerning for coexistent infection. He is finished the erythromycin I gave him for possible erythrasma this does not seem to have helped. The area on the left foot is about the same base of the dorsal toes Is area on the buttock looks improved on the left 5/11; left calf and left buttock continued to make good progress. Left foot is about the same to slightly improved. Major problem is on the right foot. He has not had an x-ray. Deep tissue culture I did last week showed both Enterobacter and [name]. coli. I did not  change the doxycycline I put him on empirically although neither 1 of these were plated to doxycycline. He arrives today with the erythema looking worse on both the dorsal and plantar foot. Macerated skin on the bottom of the foot. he has not been systemically unwell 5/18-Patient returns at 1 week, left calf wound appears to be making some progress, left buttock wound appears slightly worse than last time, left foot wound looks slightly better, right foot redness is marginally better. X-ray of both feet show no air or evidence of osteomyelitis. Patient is finished his Omnicef and terbinafine. He continues to have macerated skin on the bottom of the left foot as well as right 5/26; left calf wound is better, left buttock wound appears to have multiple small superficial open areas with surrounding macerated  skin. X-rays that I did last time showed no evidence of osteomyelitis in either foot. He is finished cefdinir and doxycycline. I do not think that he was on terbinafine. He continues to have [name] large superficial open area on the right foot anterior dorsal and slightly between the first and second toes. I did send him to dermatology 2 months ago or so wondering about whether they would do [name] fungal scraping. I do not believe they did but did do [name] culture. We have been using silver alginate to the toe areas, he has been using antifungals at home topically either ketoconazole or Lotrisone. We are using silver collagen on the left foot, silver alginate on the right, silver collagen on the left lateral leg and silver alginate on the left buttock 6/1; left buttock area is healed. We have the left dorsal foot, left lateral leg and right dorsal foot. We are using silver alginate to the areas on both feet and silver collagen to the area on his left lateral calf 6/8; the left buttock apparently reopened late last week. He is not really sure how this happened. He is tolerating the terbinafine. Using silver  alginate to all wounds 6/15; left buttock wound is larger than last week but still superficial. Came in the clinic today with [name] report of purulence from the left lateral leg I did not identify any infection Both areas on his dorsal feet appear to be better. He is tolerating the terbinafine. Using silver alginate to all wounds 6/22; left buttock is about the same this week, left calf quite [name] bit better. His left foot is about the same however he comes in with erythema and warmth in the right forefoot once again. Culture that I gave him in the beginning of May showed Enterobacter and [name]. coli. I gave him doxycycline and things seem to improve although neither 1 of these organisms was specifically plated. 6/29; left buttock is larger and dry this week. Left lateral calf looks to me to be improved. Left dorsal foot also somewhat improved right foot completely unchanged. The erythema on the right foot is still present. He is completing the Ceftin dinner that I gave him empirically [see discussion above.) 7/6 - All wounds look to be stable and perhaps improved, the left buttock wound is slightly smaller, per patient bleeds easily, completed ceftin, the right foot redness is less, he is on terbinafine 7/13; left buttock wound about the same perhaps slightly narrower. Area on the left lateral leg continues to narrow. Left dorsal foot slightly smaller right foot about the same. We are using silver alginate on the right foot and Hydrofera Blue to the areas on the left. Unna boot on the left 2 layer compression on the right 7/20; left buttock wound absolutely the same. Area on lateral leg continues to get better. Left dorsal foot require debridement as did the right no major change in the 7/27; left buttock wound the same size necrotic debris over the surface. The area on the lateral leg is closed once again. His left foot looks better right foot about the same although there is some involvement now of the  posterior first second toe area. He is still on terbinafine which I have given him for [name] month, not certain [name] centimeter major change 06/25/19-All wounds appear to be slightly improved according to report, left buttock wound looks clean, both foot wounds have minimal to no debris the right dorsal foot has minimal slough. We are using Hydrofera Blue to  the left and silver alginate to the right foot and ischial wound. 8/10-Wounds all appear to be around the same, the right forefoot distal part has some redness which was not there before, however the wound looks clean and small. Ischial wound looks about the same with no changes 8/17; his wound on the left lateral calf which was his original chronic venous insufficiency wound remains closed. Since I last saw him the areas on the left dorsal foot right dorsal foot generally appear better but require debridement. The area on his left initial tuberosity appears somewhat larger to me perhaps hyper granulated and bleeds very easily. We have been using Hydrofera Blue to the left dorsal foot and silver alginate to everything else 8/24; left lateral calf remains closed. The areas on his dorsal feet on the webspace of the first and second toes bilaterally both look better. The area on the left buttock which is the pressure ulcer stage II slightly smaller. I change the dressing to Hydrofera Blue to all areas 8/31; left lateral calf remains closed. The area on his dorsal feet bilaterally look better. Using Hydrofera Blue. Still requiring debridement on the left foot. No change in the left buttock pressure ulcers however 9/14; left lateral calf remains closed. Dorsal feet look quite [name] bit better than 2 weeks ago. Flaking dry skin also [name] lot better with the ammonium lactate I gave him 2 weeks ago. The area on the left buttock is improved. He states that his Roho cushion developed [name] leak and he is getting [name] new one, in the interim he is offloading this vigorously 9/21;  left calf remains closed. Left heel which was [name] possible DTI looks better this week. He had macerated tissue around the left dorsal foot right foot looks satisfactory and improved left buttock wound. I changed his dressings to his feet to silver alginate bilaterally. Continuing Hydrofera Blue on the left buttock. 9/28 left calf remains closed. Left heel did not develop anything [possible DTI] dry flaking skin on the left dorsal foot. Right foot looks satisfactory. Improved left buttock wound. We are using silver alginate on his feet Hydrofera Blue on the buttock. I have asked him to go back to the Lotrisone on his feet including the wounds and surrounding areas 10/5; left calf remains closed. The areas on the left and right feet about the same. [name] lot of this is epithelialized however debris over the remaining open areas. He is using Lotrisone and silver alginate. The area on the left buttock using Hydrofera Blue 10/26. Patient has been out for 3 weeks secondary to Covid concerns. He tested negative but I think his wife tested positive. He comes in today with the left foot substantially worse, right foot about the same. Even more concerning he states that the area on his left buttock closed over but then reopened and is Hinds, Rhythm [name] ([id number]) [id number].pdf Page 12 of 38 considerably deeper in one aspect than it was before [stage III wound] 11/2; left foot really about the same as last week. Quarter sized wound on the dorsal foot just proximal to the first second toes. Surrounding erythema with areas of denuded epithelium. This is not really much different looking. Did not look like cellulitis this time however. Right foot area about the same.. We have been using silver alginate alginate on his toes Left buttock still substantial irritated skin around the wound which I think looks somewhat better. We have been using Hydrofera Blue here. 11/9; left foot larger than last  week  and [name] very necrotic surface. Right foot I think is about the same perhaps slightly smaller. Debris around the circumference also addressed. Unfortunately on the left buttock there is been [name] decline. Satellite lesions below the major wound distally and now [name] an additional one posteriorly we have been using Hydrofera Blue but I think this is [name] pressure issue 11/16; left foot ulcer dorsally again [name] very adherent necrotic surface. Right foot is about the same. Not much change in the pressure ulcer on his left buttock. 11/30; left foot ulcer dorsally basically the same as when I saw him 2 weeks ago. Very adherent fibrinous debris on the wound surface. Patient reports [name] lot of drainage as well. 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 We have been using Iodoflex, patient changed back to alginate because of the drainage. Area on his right dorsal foot really looks benign with [name] healthier surface certainly [name] lot better than on the left. Left buttock wounds all improved using Hydrofera Blue 12/7; left dorsal foot again no improvement. Tightly adherent debris. PCR culture I did last week only showed likely skin contaminant. I have gone ahead and done [name] punch biopsy of this which is about the last thing in terms of investigations I can think to do. He has known venous insufficiency and venous hypertension and this could be the issue here. The area on the right foot is about the same left buttock slightly worse according to our intake nurse secondary to [facility] Blue sticking to the wound 12/14; biopsy of the left foot that I did last time showed changes that could be related to wound healing/chronic stasis dermatitis phenomenon no neoplasm. We have been using silver alginate to both feet. I change the one on the left today to Sorbact and silver alginate to his other 2 wounds 12/28; the patient arrives with the following problems; Major issue is the dorsal left  foot which continues to be [name] larger deeper wound area. Still with [name] completely nonviable surface Paradoxically the area mirror image on the right on the right dorsal foot appears to be getting better. He had some loss of dry denuded skin from the lower part of his original wound on the left lateral calf. Some of this area looked [name] little vulnerable and for this reason we put him in wrap that on this side this week The area on his left buttock is larger. He still has the erythematous circular area which I think is [name] combination of pressure, sweat. This does not look like cellulitis or fungal dermatitis 11/26/2019; -Dorsal left foot large open wound with depth. Still debris over the surface. Using Sorbact The area on the dorsal right foot paradoxically has closed over He has [name] reopening on the left ankle laterally at the base of his original wound that extended up into the calf. This appears clean. The left buttock wound is smaller but with very adherent necrotic debris over the surface. We have been using silver alginate here as well The patient had arterial studies done in 2017. He had biphasic waveforms at the dorsalis pedis and posterior tibial bilaterally. ABI in the left was 1.17. Digit waveforms were dampened. He has slight spasticity in the great toes I do not think [name] TBI would be possible 1/11; the patient comes in today with [name] sizable reopening between the first and second toes on the right. This is not exactly in the same location where we have been  treating wounds previously. According to our intake nurse this was actually fairly deep but 0.6 cm. The area on the left dorsal foot looks about the same the surface is somewhat cleaner using Sorbact, his MRI is in 2 days. We have not managed yet to get arterial studies. The new reopening on the left lateral calf looks somewhat better using alginate. The left buttock wound is about the same using alginate 1/18; the patient had his ARTERIAL studies  which were quite normal. ABI in the right at 1.13 with triphasic/biphasic waveforms on the left ABI 1.06 again with triphasic/biphasic waveforms. It would not have been possible to have done [name] toe brachial index because of spasticity. We have been using Sorbac to the left foot alginate to the rest of his wounds on the right foot left lateral calf and left buttock 1/25; arrives in clinic with erythema and swelling of the left forefoot worse over the first MTP area. This extends laterally dorsally and but also posteriorly. Still has an area on the left lateral part of the lower part of his calf wound it is eschared and clearly not closed. Area on the left buttock still with surrounding irritation and erythema. Right foot surface wound dorsally. The area between the right and first and second toes appears better. 2/1; The left foot wound is about the same. Erythema slightly better I gave him [name] week of doxycycline empirically Right foot wound is more extensive extending between the toes to the plantar surface Left lateral calf really no open surface on the inferior part of his original wound however the entire area still looks vulnerable Absolutely no improvement in the left buttock wound required debridement. 2/8; the left foot is about the same. Erythema is slightly improved I gave him clindamycin last week. Right foot looks better he is using Lotrimin and silver alginate He has [name] breakdown in the left lateral calf. Denuded epithelium which I have removed Left buttock about the same were using Hydrofera Blue 2/15; left foot is about the same there is less surrounding erythema. Surface still has tightly adherent debris which I have debriding however not making any progress Right foot has [name] substantial wound on the medial right second toe between the first and second webspace. Still an open area on the left lateral calf distal area. Buttock wound is about the same 2/22; left foot is about the same  less surrounding erythema. Surface has adherent debris. Polymen Ag Right foot area significant wound between the first and second toes. We have been using silver alginate here Left lateral leg polymen Ag at the base of his original venous insufficiency wound Left buttock some improvement here 3/1; Right foot is deteriorating in the first second toe webspace. Larger and more substantial. We have been using silver alginate. Left dorsal foot about the same markedly adherent surface debris using PolyMem Ag Left lateral calf surface debris using PolyMem AG Left buttock is improved again using PolyMem Ag. He is completing his terbinafine. The erythema in the foot seems better. He has been on this for 2 weeks 3/8; no improvement in any wound area in fact he has [name] small open area on the dorsal midfoot which is new this week. He has not gotten his foot x-rays yet 3/15; his x-rays were both negative for osteomyelitis of both feet. No major change in any of his wounds on the extremities however his buttock wounds are better. We have been using polymen on the buttocks, left lower leg. Iodoflex on  the left foot and silver alginate on the right 3/22; arrives in clinic today with the 2 major issues are the improvement in the left dorsal foot wound which for once actually looks healthy with [name] nice healthy wound surface without debridement. Using Iodoflex here. Unfortunately on the left lateral calf which is in the distal part of his original wound he came to the clinic here for there was purulent drainage noted some increased breakdown scattered around the original area and [name] small area proximally. We we are using polymen here will change to silver alginate today. His buttock wound on the left is better and I think the area on the right first second toe webspace is also improved 3/29; left dorsal foot looks better. Using Iodoflex. Left ankle culture from deterioration last time grew [name]. coli, Enterobacter and  Enterococcus. I will give him [name] course of cefdinir although that will not cover Enterococcus. The area on the right foot in the webspace of the first and second toe lateral first toe looks better. The area on his buttock is about healed Vascular appointment is on April 21. This is to look at his venous system vis--vis continued breakdown of the wounds on the left including the left lateral leg and left dorsal foot he. He has had previous ablations on this side 4/5; the area between the right first and second toes lateral aspect of the first toe looks better. Dorsal aspect of the left first toe on the left foot also improved. Unfortunately the left lateral lower leg is larger and there is [name] second satellite wound superiorly. The usual superficial abrasions on the left buttock overall better but certainly not closed 4/12; the area between the right first and second toes is improved. Dorsal aspect of the left foot also slightly smaller with [name] vibrant healthy looking surface. No real change in the left lateral leg and the left buttock wound is healed Biehl, Kinsler [name] ([id number]) [id number] Page 13 of 47 He has an unaffordable co-pay for Apligraf. Appointment with vein and vascular with regards to the left leg venous part of the circulation is on 4/21 4/19; we continue to see improvement in all wound areas. Although this is minor. He has his vascular appointment on 4/21. The area on the left buttock has not reopened although right in the center of this area the skin looks somewhat threatened 4/26; the left buttock is unfortunately reopened. In general his left dorsal foot has [name] healthy surface and looks somewhat smaller although it was not measured as such. The area between his first and second toe webspace on the right as [name] small wound against the first toe. The patient saw vascular surgery. The real question I was asking was about the small saphenous vein on the left. He has  previously ablated left greater saphenous vein. Nothing further was commented on on the left. Right greater saphenous vein without reflux at the saphenofemoral junction or proximal thigh there was no indication for ablation of the right greater saphenous vein duplex was negative for DVT bilaterally. They did not think there was anything from [name] vascular surgery point of view that could be offered. They ABIs within normal limits 5/3; only small open area on the left buttock. The area on the left lateral leg which was his original venous reflux is now 2 wounds both which look clean. We are using Iodoflex on the left dorsal foot which looks healthy and smaller. He is down to [name] very tiny area between the  right first and second toes, using silver alginate 5/10; all of his wounds appear better. We have much better edema control in 4 layer compression on the left. This may be the factor that is allowing the left foot and left lateral calf to heal. He has external compression garments at home 04/14/20-All of his wounds are progressing well, the left forefoot is practically closed, left ischium appears to be about the same, right toe webspace is also smaller. The left lateral leg is about the same, continue using Hydrofera Blue to this, silver alginate to the ischium, Iodoflex to the toe space on the right 6/7; most of his wounds outside of the left buttock are doing well. The area on the left lateral calf and left dorsal foot are smaller. The area on the right foot in between the first and second toe webspace is barely visible although he still says there is some drainage here is the only reason I did not heal this out. Unfortunately the area on the left buttock almost looks like he has [name] skin tear from tape. He has open wound and then [name] large flap of skin that we are trying to get adherence over an area just next to the remaining wound 6/21; 2 week follow-up. I believe is been here for nurse visits.  Miraculously the area between his first and second toes on the left dorsal foot is closed over. Still open on the right first second web space. The left lateral calf has 2 open areas. Distally this is more superficial. The proximal area had [name] little more depth and required debridement of adherent necrotic material. His buttock wound is actually larger we have been using silver alginate here 6/28; the patient's area on the left foot remains closed. Still open wet area between the first and second toes on the right and also extending into the plantar aspect. We have been using silver alginate in this location. He has 2 areas on the left lower leg part of his original long wounds which I think are better. We have been using Hydrofera Blue here. Hydrofera Blue to the left buttock which is stable 7/12; left foot remains closed. Left ankle is closed. May be [name] small area between his right first and second toes the only truly open area is on the left buttock. We have been using Hydrofera Blue here 7/19; patient arrives with marked deterioration especially in the left foot and ankle. We did not put him in [name] compression wrap on the left last week in fact he wore his juxta lite stockings on either side although he does not have an underlying stocking. He has [name] reopening on the left dorsal foot, left lateral ankle and [name] new area on the right dorsal ankle. More worrisome is the degree of erythema on the left foot extending on the lateral foot into the lateral lower leg on the left 7/26; the patient had erythema and drainage from the lateral left ankle last week. Culture of this grew MRSA resistant to doxycycline and clindamycin which are the 2 antibiotics we usually use with this patient who has multiple antibiotic allergies including linezolid, trimethoprim sulfamethoxazole. I had give him an empiric doxycycline and he comes in the area certainly looks somewhat better although it is blotchy in his lower leg. He has  not been systemically unwell. He has had areas on the left dorsal foot which is [name] reopening, chronic wounds on the left lateral ankle. Both of these I think are secondary to  chronic venous insufficiency. The area between his first and second toes is closed as far as I can tell. He had [name] new wrap injury on the right dorsal ankle last week. Finally he has an area on the left buttock. We have been using silver alginate to everything except the left buttock we are using Hydrofera Blue 06/30/20-Patient returns at 1 week, has been given [name] sample dose pack of NUZYRA which is [name] tetracycline derivative [omadacycline], patient has completed those, we have been using silver alginate to almost all the wounds except the left ischium where we are using Hydrofera Blue all of them look better 8/16; since I last saw the patient he has been doing well. The area on the left buttock, left lateral ankle and left foot are all closed today. He has completed the [place] I gave him last time and tolerated this well. He still has open areas on the right dorsal ankle and in the right first second toe area which we are using silver alginate. 8/23; we put him in his bilateral external compression stockings last week as he did not have anything open on either leg except for concerning area between the right first and second toe. He comes in today with an area on the left dorsal foot slightly more proximal than the original wound, the left lateral foot but this is actually [name] continuation of the area he had on the left lateral ankle from last time. As well he is opened up on the left buttock again. 8/30; comes in today with things looking [name] lot better. The area on the left lower ankle has closed down as has the left foot but with eschar in both areas. The area on the dorsal right ankle is also epithelialized. Very little remaining of the left buttock wound. We have been using silver alginate on all wound areas 9/13; the area in the  first second toe webspace on the right has fully epithelialized. He still has some vulnerable epithelium on the right and the ankle and the dorsal foot. He notes weeping. He is using his juxta lite stocking. On the left again the left dorsal foot is closed left lateral ankle is closed. We went to the juxta lite stocking here as well. Still vulnerable in the left buttock although only 2 small open areas remain here 9/27; 2-week follow-up. We did not look at his left leg but the patient says everything is closed. He is [name] bit disturbed by the amount of edema in his left foot he is using juxta lite stockings but asking about over the toes stockings which would be 30/40, will talk to him next time. According to him there is no open wound on either the left foot or the left ankle/calf He has an open area on the dorsal right calf which I initially point [name] wrap injury. He has superficial remaining wound on the left ischial tuberosity been using silver alginate although he says this sticks to the wound 10/5; we gave him 2-week follow-up but he called yesterday expressing some concerns about his right foot right ankle and the left buttock. He came in early. There is still no open areas on the left leg and that still in his juxta lite stocking 10/11; he only has 1 small area on the left buttock that remains measuring millimeters 1 mm. Still has the same irritated skin in this area. We recommended zinc oxide when this eventually closes and pressure relief is meticulously is he can do this.  He still has an area on the dorsal part of his right first through third toes which is [name] bit irritated and still open and on the dorsal ankle near the crease of the ankle. We have been using silver alginate and using his own stocking. He has nothing open on the left leg or foot 10/25; 2-week follow-up. Not nearly as good on the left buttock as I was hoping. For open areas with 5 looking threatened small. He has the  erythematous irritated chronic skin in this area. 1 area on the right dorsal ankle. He reports this area bleeds easily Right dorsal foot just proximal to the base of his toes We have been using silver alginate. 11/8; 2-week follow-up. Left buttock is about the same although I do not think the wounds are in the same location we have been using silver alginate. I have asked him to use zinc oxide on the skin around the wounds. He still has [name] small area on the right dorsal ankle he reports this bleeds easily Right dorsal foot just proximal to the base of the toes does not have anything open although the skin is very dry and scaly He has [name] new opening on the nailbed of the left great toe. Nothing on the left ankle 11/29; 3-week follow-up. Left buttock has 2 open areas. And washing of these wounds today started bleeding easily. Suggesting very friable tissue. We have been using silver alginate. Right dorsal ankle which I thought was initially [name] wrap injury we have been using silver alginate. Nothing open between the toes that I can see. He states the area on the left dorsal toe nailbed healed after the last visit in 2 or 3 days 12/13; 3-week follow-up. His left buttock now has 3 open areas but the original 2 areas are smaller using polymen here. Surrounding skin looks better. The right dorsal ankle is closed. He has [name] small opening on the right dorsal foot at the level of the third toe. In general the skin looks better here. He is wearing his juxta lite stocking on the left leg says there is nothing open 11/24/2020; 3 weeks follow-up. His left buttock still has the 3 open areas. We have been using polymen but due to lack of response he changed to [facility] area. Surrounding skin is dry erythematous and irritated looking. There is no evidence of infection either bacterial or fungal however there is loss of surface epithelium He still has very dry skin in his foot causing irritation and erythema on the  dorsal part of his toes. This is not responded to prolonged courses of antifungal simply looks dry and irritated 1/24; left buttock area still looks about the same he was unable to find the triad ointment that we had suggested. The area on the right lower leg just above the dorsal ankle has reopened and the areas on the right foot between the first second and second third toes and scaling on the bottom of the foot has been about the same for quite some time now. been using silver alginate to all wound areas 2/7; left buttock wound looked quite good although not much smaller in terms of surface area surrounding skin looks better. Only [name] few dry flaking areas on the Mccoll, Yecheskel [name] ([id number]) [id number].pdf Page 14 of 38 right foot in between the first and second toes the skin generally looks better here [ammonium lactate]. Finally the area on the right dorsal ankle is closed 2/21; There is no open area  on the right foot even between the right first and second toe. Skin around this area dorsally and plantar aspects look better. He has [name] reopening of the area on the right ankle just above the crease of the ankle dorsally. I continue to think that this is probably friction from spasms may be even this time with his stocking under the compression stockings. Wounds on his left buttock look about the same there [name] couple of areas that have reopened. He has [name] total square area of loss of epithelialization. This does not look like infection it looks like [name] contact dermatitis but I just cannot determine to what 3/14; there is nothing on the right foot between the first and second toes this was carefully inspected under illumination. Some chronic irritation on the dorsal part of his foot from toes 1-3 at the base. Nothing really open here substantially. Still has an area on the right foot/ankle that is actually larger and hyper granulated. His buttock area on the left is just about closed  however he has chronic inflammation with loss of the surface epithelial layer 3/28; 2-week follow-up. In clinic today with [name] new wound on the left anterior mid tibia. Says this happened about 2 weeks ago. He is not really sure how wonders about the spasticity of his legs at night whether that could have caused this other than that he does not have [name] good idea. He has been using topical antibiotics and silver alginate. The area on his right dorsal ankle seems somewhat better. Finally everything on his left buttock is closed. 4/11; 2-week follow-up. All of his wounds are better except for the area over the ischium and left buttock which have opened up widely again. At least part of this is covered in necrotic fibrinous material another part had rolled nonviable skin. The area on the right ankle, left anterior mid tibia are both [name] lot better. He had no open wounds on either foot including the areas between the first and second toes 4/25; patient presents for 2-week follow-up. He states that the wounds are overall stable. He has no complaints today and states he is using Hydrofera Blue to open wounds. 5/9; have not seen this man in over [name] month. For my memory he has open areas on the left mid tibia and right ankle. T oday he has new open area on the right dorsal foot which we have not had [name] problem with recently. He has the sustained area on the left buttock He is also changed his insurance at the beginning of the year [employer]. We will need prior authorizations for debridement 5/23; patient presents for 2-week follow-up. He has prior authorizations for debridement. He denies any issues in the past 2 weeks with his wound care. He has been using Hydrofera Blue to all the wounds. He does report [name] circular rash to the upper left leg that is new. He denies acute signs of infection. 6/6; 2-week follow-up. The patient has open wounds on the left buttock which are worse than the last time I saw this about  [name] month ago. He also has [name] new area to me on the left anterior mid tibia with some surrounding erythema. The area on the dorsal ankle on the right is closed but I think this will be [name] friction injury every time this area is exposed to either our wraps or his compression stockings caused by unrelenting spasms in this leg. 6/20; 2-week follow-up. The patient has open wounds on the  left buttock which is about the same. Using [facility] here. - The left mid tibia has [name] static amount of surrounding erythema. Also [name] raised area in the center. We have been using Hydrofera Blue here. Finally he has broken down in his dorsal right foot extending between the first and second toes and going to the base of the first and second toe webspace. I have previously assumed that this was severe venous hypertension 7/5; 2-week follow-up The left buttock wound actually looks better. We are using Hydrofera Blue. He has extensive skin irritation around this area and I have not really been able to get that any better. I have tried Lotrisone i.[name]. antifungals and steroids. More most recently we have just been using Coloplast really looks about the same. The left mid tibia which was new last week culture to have very resistant staph aureus. Not only methicillin-resistant but doxycycline resistant. The patient has [name] plethora of antibiotic allergies including sulfa, linezolid. I used topical bacitracin on this but he has not started this yet. In addition he has an expanding area of erythema with [name] wound on the dorsal right foot. I did [name] deep tissue culture of this area today 7/12; Left buttock area actually looks better surrounding skin also looks less irritated. Left mid tibia looks about the same. He is using bacitracin this is not worse Right dorsal foot looks about the same as well. The left first toe also looks about the same 7/19; left buttock wound continues to improve in terms of open areas Left mid tibia is still  concerning amount of swelling he is using bacitracin Dorsal left first toe somewhat smaller Right dorsal foot somewhat smaller 7/25; left buttock wound actually continues to improve Left mid tibia area has less swelling. I gave him all my samples of new Nuzyra. This seems to have helped although the wound is still open it. His abrasion closed by here Left dorsal great toe really no better. Still [name] very nonviable surface Right dorsal foot perhaps some better. We have been using bacitracin and silver nitrate to the areas on his lower legs and Hydrofera Blue to the area on the buttock. 8/16 Disappointed that his left buttock wound is actually more substantial. Apparently during the last nurse visit these were both very small. He has continued irritation to [name] large area of skin on his buttock. I have never been able to totally explain this although I think it some combination of the way he sits, pressure, moisture. He is not incontinent enough to contribute to this. Left dorsal great toe still fibrinous debris on the surface that I have debrided today Large area across the dorsal right toes. The area on the left anterior mid tibia has less swelling. He completed the [place]. This does not look infected although the tissue is still fried 8/30; 2-week follow-up. Left buttock areas not improved. We used Hydrofera Blue on this. Weeping wet with the surrounding erythema that I have not been able to control even with Lotrisone and topical Coloplast Left dorsal great toe looks about the same More substantial area again at the base of his toes on the left which is new this week. Area across the dorsal right toes looks improved The left anterior mid tibia looks like it is trying to close 9/13; 2-week follow-up. Using silver alginate on all of his wounds. The left dorsal foot does not look any better. He has the area on the dorsal toe and also the areas at the  base of all of his toes 1 through 3. On the  right foot he has [name] similar pattern in [name] similar area. He has the area on his left mid tibia that looks fairly healthy. Finally the area on his left buttock looks somewhat bette 9/20; culture I did of the left foot which was [name] deep tissue culture last time showed [name]. coli he has erythema around this wound. Still [name] completely necrotic surface. His right dorsal foot looks about the same. He has [name] very friable surface to the left anterior mid tibia. Both buttock wounds look better. We have been using silver alginate to all wounds 10/4; he has completed the cephalexin that I gave him last time for the left foot. He is using topical gentamicin under silver alginate silver alginate being applied to all the wounds. Unfortunately all the wounds look irritated on his dorsal right foot dorsal left foot left mid tibia. I wonder if this could be [name] silver allergy. I am going to change him to [facility] on the lower extremity. The skin on the left buttock and left posterior thigh still flaking dry and irritated. This has continued no matter what I have applied topically to this. He has [name] solitary open wound which by itself does not look too bad however the entire area of surrounding skin does not change no matter what we have applied here 10/18; the area on the left dorsal foot and right dorsal foot both look better. The area on the right extends into the plantar but not between his toes. We have been using silver alginate. He still has [name] rectangular erythematous area around the area on the left tibia. The wound itself is very small. Finally everything on his left buttock looks [name] little larger the skin is erythematous 11/15; patient comes in with the left dorsal and right dorsal foot distally looking somewhat better. Still nonviable surface on the left foot which required debridement. He still has the area on the left anterior mid tibia although this looks somewhat better. He has [name] new area on the right lateral  lower leg just above the ankle. Finally his left buttock looks terrible with multiple superficial open wounds geometric square shaped area of chronic erythema which I have not been able to sort through Smedley, [place] [name] ([id number]) [id number].pdf Page 15 of 38 11/29; right dorsal foot and left dorsal foot both look somewhat better. No debridement required. He has the fragile area on the left anterior mid tibia this looks and continues to look somewhat better. Right lateral lower leg just above the ankle we identified last time also looks better. In general the area on his left buttock looks improved. We are using Hydrofera Blue to all wound areas 12/13; right dorsal foot looks better. The area on the right lateral leg is healed. Left dorsal foot has 2 open areas both of which require debridement. The fragile area on the left anterior mid tibial looks better. Smaller area on his buttocks. Were using Hydrofera Blue 1/10; patient comes in with everything looking slightly larger and/or worse. This includes his left buttock, reopening of the left mid tibia, larger areas on the left dorsal foot and what looks to be [name] cellulitis on the right dorsal and plantar foot. We have been using Hydrofera Blue on all wounds. 1/17; right dorsal foot distally looks better today. The left foot has 2 open wounds that are about the same surrounding erythema. Culture I did last week showed rare Enterococcus and  [name] multidrug resistant MRSA. 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 No malignancy they did not stain for fungus 1/24; his right distal foot is not closed dry and scaly but the wound looks like it is contracting. I did not debride anything here. Problem on his left dorsal foot with expanding erythema. Apparently there were problems last week getting the Riki Altes however it is now available at the [facility] but [name] week later. He is  using ketoconazole and Coloplast to the left buttock along with Hydrofera Blue this actually looks quite [name] bit better today. 1/31; right dorsal foot again is dry and scaly but looking to contract. He has been using [name] moisturizer on his feet at my request but he is not sure which 1. The left dorsal foot wounds look about the same there is erythema here that I marked last week however after course of Nuzyra it certainly is not any better but not any worse either. Finally on his left buttock the skin continues to look better he has the original wound but [name] new substantial area towards the gluteal cleft. Almost like [name] skin tear. I used scissors to remove skin and subcutaneous tissue here silver nitrate and direct pressure 2/7; right dorsal foot. This does not look too much different from last week. Some erythema skin dry and scaly. No debridement. Left dorsal great toe again still not much improvement. I did remove flaking dry skin and callus from around the edge. Finally on his left buttock. The skin is somewhat better in the periwound. Surface wounds are superficial somewhat better than last week. 01/26/2022: Is [name] little bit of [name] mystery as to why his wounds fail to respond to treatments and actually seem to get worse. This is my first encounter with this patient. He was previously followed by Dr. Leanord Hawking. Based upon my review of the chart, it seems that there is [name] little bit of [name] mystery as to why his wounds do not respond as anticipated to the interventions applied and sometimes even get worse. Biopsies have been performed and he was seen by dermatology in [place], but that did not shed any light on the matter. T oday, his gluteal wound is larger, with substantial drainage, rather malodorous. The food wounds are not terrible, but he has [name] lot of callus and scaly skin around these. He is currently getting silver alginate on the gluteal wound, with idodoflex to the feet. He is using lotrisone on his legs  for the dry, scaly skin. 02/09/2022: There has really been no change to any of his wounds. The gluteal wound less drainage and odor, but remains about the same size, the periwound skin remains oddly scaly. His lower extremity wounds also appear roughly the same size. They continue to accumulate [name] small amount of slough. The periwound on his feet and ankle wounds has dry eschar and loose dead skin. We have been using silver alginate on the gluteal wound and Iodoflex on his feet and ankle wounds. T the periwound around his gluteal lesion and Lotrisone on his feet and legs. o 02/23/2022: The right plantar foot wound is closed. The gluteal site looks small but has continued to produce hypertrophic granulation tissue. The foot wounds all look about the same on the dorsal surface of the right foot; on the left, there is only [name] small open area at the site of where his left great toenail would have been. 03/16/2022: The right ankle wound is healed.  The right dorsal foot wound is about the same. The left dorsal foot wound is quite [name] bit smaller and the ischial wound is nearly closed. 03/30/2022: The right ankle wound reopened. Both dorsal foot wounds are quite [name] bit smaller. Unfortunately, he appears to have sheared part of his ischial wound open further, perhaps during [name] transfer. 04/13/2022: The right ankle wound has hypertrophic granulation tissue present. The dorsal foot wounds continue to decrease in size. The ischial wound looks about the same today, no better, no worse. 04/27/2022: The right ankle wound has closed. Unfortunately, it looks like some moisture got underneath the dry skin on both of his dorsal feet and these wounds have expanded in size. The ischial wound remains the same with perhaps [name] little bit more slough accumulation than at our previous visit. 05/11/2022: The right ankle wound remains closed. There is [name] left anterior tibial wound that is small has patchy openings with accumulated slough. The  dorsum of his right foot appears to be nearly healed with just [name] small punctate opening. The plantar surface of his right foot has [name] new opening that looks like he may have picked some skin there. His sacral ulcer has hypertrophic granulation tissue but has some slough accumulation. The dorsum of his left foot has multiple open areas in [name] fairly ragged distribution. All of these have slough accumulated within them. 06/01/2022: The right ankle and left anterior tibial wound are both closed. Dorsum of his right foot and left foot both look substantially better with just tiny scattered openings The without any slough accumulation.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 T brachial index was 1.3 oe on the right. His waveforms were biphasic bilaterally. Doppler waveforms of the digit were normal in the right damp and on the left. Comment that this could've been due to extreme edema. His venous studies show reflux on both sides in the femoral popliteal veins as well as the greater and lesser saphenous veins bilaterally. Ultimately he is going to need to see vascular surgery about this issue. Hopefully when we can get his wounds and [name] little better shape. 02/19/16; the patient was able to complete [name] course of [facility]'s for MRSA in the face of multiple antibiotic allergies. Arterial studies showed an ABI of him 0.88 on the right 1.17 on the  left the. Waveforms were biphasic at the posterior tibial and dorsalis pedis digital waveforms were normal. Right toe brachial index was 1.3 limited by shaking and edema. His venous study showed widespread reflux in the left at the common femoral vein the greater and lesser saphenous vein the greater and lesser saphenous vein on  the right as well as the popliteal and femoral vein. The popliteal and femoral vein on the left did not show reflux. His wounds on the right leg give healed on the left he is still using Santyl. 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 The erythema has not really resolved and I wonder if this is mostly venous inflammation rather than cellulitis. Still using Santyl. He is approved for Apligraf 03/04/16; there is less erythema around the wound. Both wounds require aggressive surgical debridement. Not yet ready for Apligraf 03/11/16; aggressive debridement again. Not ready for Apligraf 03/18/16 aggressive debridement again. 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 Has been to see vascular surgery he is being planned for [name] venous ablation 03/25/16; aggressive debridement again of both wound areas on the left lateral leg. He is due for ablation surgery on May 22. He is much closer to being ready for an Apligraf. Has [name] new area between the left first and second toes 04/01/16 aggressive debridement done of both wounds. The new wound at the base of between his second and first toes looks stable 04/08/16; continued aggressive debridement of both wounds on the left lower leg. He goes for his venous ablation on Monday. The new wound at the base of his first and second toes dorsally appears stable. 04/15/16; wounds aggressively debridement although the base of this looks considerably better Apligraf #1. He had ablation surgery on Monday I'll need to research these records. We only have approval for four Apligraf's 04/22/16; the  patient is here for [name] wound check [Apligraf last week] intake nurse concerned about erythema around the wounds. Apparently [name] significant degree of drainage. The patient has chronic venous inflammation which I think accounts for most of this however I was asked to look at this today 04/26/16; the patient came back for check of possible cellulitis in his left foot however the Apligraf dressing was inadvertently removed therefore we elected to prep the wound for [name] second Apligraf. I put him on doxycycline on 6/1 the erythema in the foot 05/03/16 we did not remove the dressing from the superior wound as this is where I put all of his last Apligraf. Surface debridement done with [name] curette of the lower wound which looks very healthy. The area on the left foot also looks quite satisfactory at the dorsal artery at the first and second toes 05/10/16; continue Apligraf to this. Her wound, Hydrafera to the lower wound. He has [name] new area on the right second toe. Left dorsal foot firstoosecond toe also looks improved 05/24/16; wound dimensions must be smaller I was able to use Apligraf to all 3 remaining wound areas. 06/07/16 patient's last Apligraf was 2 weeks ago. He arrives today with the 2 wounds on his lateral left leg joined together. This would have to be seen as [name] negative. He also has [name] small wound in his first and second toe on the left dorsally with quite [name] bit of surrounding erythema in the first second and third toes. This looks to be infected or inflamed, very difficult clinical call. 06/21/16: lateral left leg combined wounds. Adherent surface slough area on the left dorsal foot at roughly the fourth toe looks improved 07/12/16; he now has [name] single linear wound on the lateral left leg. This does not look to be [name] lot changed from when I lost saw this. The area on his dorsal left foot looks considerably better however. 08/02/16; no major change in  the substantial area on his left lateral leg since last time.  We have been using Hydrofera Blue for [name] prolonged period of time now. The area on his left foot is also unchanged from last review 07/19/16; the area on his dorsal foot on the left looks considerably smaller. He is beginning to have significant rims of epithelialization on the lateral left leg wound. This also looks better. 08/05/16; the patient came in for [name] nurse visit today. Apparently the area on his left lateral leg looks better and it was wrapped. However in general discussion the patient noted [name] new area on the dorsal aspect of his right second toe. The exact etiology of this is unclear but likely relates to pressure. 08/09/16 really the area on the left lateral leg did not really look that healthy today perhaps slightly larger and measurements. The area on his dorsal right second toe is improved also the left foot wound looks stable to improved 08/16/16; the area on the last lateral leg did not change any of dimensions. Post debridement with [name] curet the area looked better. Left foot wound improved and the area on the dorsal right second toe is improved 08/23/16; the area on the left lateral leg may be slightly smaller both in terms of length and width. Aggressive debridement with [name] curette afterwards the tissue appears healthier. Left foot wound appears improved in the area on the dorsal right second toe is improved 08/30/16 patient developed [name] fever over the weekend and was seen in an urgent care. Felt to have [name] UTI and put on doxycycline. He has been since changed over the phone to [facility]. After we took off the wrap on his right leg today the leg is swollen warm and erythematous, probably more likely the source of the fever 09/06/16; have been using collagen to the major left leg wound, silver alginate to the area on his anterior foot/toes 09/13/16; the areas on his anterior foot/toes on both sides appear to be virtually closed. Extensive wound on the left lateral leg perhaps slightly narrower  but each visit still covered an adherent surface slough 09/16/16 patient was in for his usual Thursday nurse visit however the intake nurse noted significant erythema of his dorsal right foot. He is also running [name] low- grade fever and having increasing spasms in the right leg 09/20/16 here for cellulitis involving his right great toes and forefoot. This is [name] lot better. Still requiring debridement on his left lateral leg. Santyl direct says he Mcbrearty, Amari [name] ([id number]) [phone].pdf Page 22 of 38 needs prior authorization. Therefore his wife cannot change this at home 09/30/16; the patient's extensive area on the left lateral calf and ankle perhaps somewhat better. Using Santyl. The area on the left toes is healed and I think the area on his right dorsal foot is healed as well. There is no cellulitis or venous inflammation involving the right leg. He is going to need compression stockings here. 10/07/16; the patient's extensive wound on the left lateral calf and ankle does not measure any differently however there appears to be less adherent surface slough using Santyl and aggressive weekly debridements 10/21/16; no major change in the area on the left lateral calf. Still the same measurement still very difficult to debridement adherent slough and nonviable subcutaneous tissue. This is not really been helped by several weeks of Santyl. Previously for 2 weeks I used Iodoflex for [name] short period. [name] prolonged course of Hydrofera Blue didn't really help. I'm not sure why  I only used 2 weeks of Iodoflex on this there is no evidence of surrounding infection. He has [name] small area on the right second toe which looks as though it's progressing towards closure 10/28/16; the wounds on his toes appear to be closed. No major change in the left lateral leg wound although the surface looks somewhat better using Iodoflex. He has had previous arterial studies that were normal. He has had reflux  studies and is status post ablation although I don't have any exact notes on which vein was ablated. I'll need to check the surgical record 11/04/16; he's had [name] reopening between the first and second toe on the left and right. No major change in the left lateral leg wound. There is what appears to be cellulitis of the left dorsal foot 11/18/16 the patient was hospitalized initially in [place] and then subsequently transferred to [facility] long and was admitted there from 11/09/16 through 11/12/16. He had developed progressive cellulitis on the right leg in spite of the doxycycline I gave him. I'd spoken to the hospitalist in [place] who was concerned about continuing leukocytosis. CT scan is what I suggested this was done which showed soft tissue swelling without evidence of osteomyelitis or an underlying abscess blood cultures were negative. At [facility] he was treated with vancomycin and Primaxin and then add an infectious disease consult. He was transitioned to Ceftaroline. He has been making progressive improvement. Overall [name] severe cellulitis of the right leg. He is been using silver alginate to her original wound on the left leg. The wounds in his toes on the right are closed there is [name] small open area on the base of the left second toe 11/26/15; the patient's right leg is much better although there is still some edema here this could be reminiscent from his severe cellulitis likely on top of some degree of lymphedema. His left anterior leg wound has less surface slough as reported by her intake nurse. Small wound at the base of the left second toe 12/02/16; patient's right leg is better and there is no open wound here. His left anterior lateral leg wound continues to have [name] healthy-looking surface. Small wound at the base of the left second toe however there is erythema in the left forefoot which is worrisome 12/16/16; is no open wounds on his right leg. We took measurements for stockings. His left  anterior lateral leg wound continues to have [name] healthy-looking surface. I'm not sure where we were with the Apligraf run through his insurance. We have been using Iodoflex. He has [name] thick eschar on the left first second toe interface, I suspect this may be fungal however there is no visible open 12/23/16; no open wound on his right leg. He has 2 small areas left of the linear wound that was remaining last week. We have been using Prisma, I thought I have disclosed this week, we can only look forward to next week 01/03/17; the patient had concerning areas of erythema last week, already on doxycycline for UTI through his primary doctor. The erythema is absolutely no better there is warmth and swelling both medially from the left lateral leg wound and also the dorsal left foot. 01/06/17- Patient is here for follow-up evaluation of his left lateral leg ulcer and bilateral feet ulcers. He is on oral antibiotic therapy, tolerating that. Nursing staff and the patient states that the erythema is improved from Monday. 01/13/17; the predominant left lateral leg wound continues to be problematic. I had put  Apligraf on him earlier this month once. However he subsequently developed what appeared to be an intense cellulitis around the left lateral leg wound. I gave him Dalvance I think on 2/12 perhaps 2/13 he continues on cefdinir. The erythema is still present but the warmth and swelling is improved. I am hopeful that the cellulitis part of this control. I wouldn't be surprised if there is an element of venous inflammation as well. 01/17/17. The erythema is present but better in the left leg. His left lateral leg wound still does not have [name] viable surface buttons certain parts of this long thin wound it appears like there has been improvement in dimensions. 01/20/17; the erythema still present but much better in the left leg. I'm thinking this is his usual degree of chronic venous inflammation. The wound on the left  leg looks somewhat better. Is less surface slough 01/27/17; erythema is back to the chronic venous inflammation. The wound on the left leg is somewhat better. I am back to the point where I like to try an Apligraf once again 02/10/17; slight improvement in wound dimensions. Apligraf #2. He is completing his doxycycline 02/14/17; patient arrives today having completed doxycycline last Thursday. This was supposed to be [name] nurse visit however once again he hasn't tense erythema from the medial part of his wound extending over the lower leg. Also erythema in his foot this is roughly in the same distribution as last time. He has baseline chronic venous inflammation however this is [name] lot worse than the baseline I have learned to accept the on him is baseline inflammation 02/24/17- patient is here for follow-up evaluation. He is tolerating compression therapy. His voicing no complaints or concerns he is here anticipating an Apligraf 03/03/17; he arrives today with an adherent necrotic surface. I don't think this is surface is going to be amenable for Apligraf's. The erythema around his wound and on the left dorsal foot has resolved he is off antibiotics 03/10/17; better-looking surface today. I don't think he can tolerate Apligraf's. He tells me he had [name] wound VAC after [name] skin graft years ago to this area and they had difficulty with [name] seal. The erythema continues to be stable around this some degree of chronic venous inflammation but he also has recurrent cellulitis. We have been using Iodoflex 03/17/17; continued improvement in the surface and may be small changes in dimensions. Using Iodoflex which seems the only thing that will control his surface 03/24/17- He is here for follow up evaluation of his LLE lateral ulceration and ulcer to right dorsal foot/toe space. He is voicing no complaints or concerns, He is tolerating compression wrap. 03/31/17 arrives today with [name] much healthier looking wound on the left lower  extremity. We have been using Iodoflex for [name] prolonged period of time which has for the first time prepared and adequate looking wound bed although we have not had much in the way of wound dimension improvement. He also has [name] small wound between the first and second toe on the right 04/07/17; arrives today with [name] healthy-looking wound bed and at least the top 50% of this wound appears to be now her. No debridement was required I have changed him to [facility] last week after prolonged Iodoflex. He did not do well with Apligraf's. We've had [name] re-opening between the first and second toe on the right 04/14/17; arrives today with [name] healthier looking wound bed contractions and the top 50% of this wound and some on the lesser  50%. Wound bed appears healthy. The area between the first and second toe on the right still remains problematic 04/21/17; continued very gradual improvement. Using [facility] 04/28/17; continued very gradual improvement in the left lateral leg venous insufficiency wound. His periwound erythema is very mild. We have been using Hydrofera Blue. Wound is making progress especially in the superior 50% 05/05/17; he continues to have very gradual improvement in the left lateral venous insufficiency wound. Both in terms with an length rings are improving. I debrided this every 2 weeks with #5 curet and we have been using Hydrofera Blue and again making good progress With regards to the wounds between his right first and second toe which I thought might of been tinea pedis he is not making as much progress very dry scaly skin over the area. Also the area at the base of the left first and second toe in [name] similar condition 05/12/17; continued gradual improvement in the refractory left lateral venous insufficiency wound on the left. Dimension smaller. Surface still requiring debridement using Hydrofera Blue 05/19/17; continued gradual improvement in the refractory left lateral venous  ulceration. Careful inspection of the wound bed underlying rumination suggested some degree of epithelialization over the surface no debridement indicated. Continue Hydrofera Blue difficult areas between his toes first and third on the left than first and second on the right. I'm going to change to silver alginate from silver collagen. Continue ketoconazole as I suspect underlying tinea pedis 05/26/17; left lateral leg venous insufficiency wound. We've been using Hydrofera Blue. I believe that there is expanding epithelialization over the surface of the wound albeit not coming from the wound circumference. This is [name] bit of an odd situation in which the epithelialization seems to be coming from the surface of the wound rather than in the exact circumference. There is still small open areas mostly along the lateral margin of the wound. ooHe has unchanged areas between the left first and second and the right first second toes which I been treating for tenia pedis 06/02/17; left lateral leg venous insufficiency wound. We have been using Hydrofera Blue. Somewhat smaller from the wound circumference. The surface of the wound remains [name] bit on it almost epithelialized sedation in appearance. I use an open curette today debridement in the surface of all of this especially the edges ooSmall open wounds remaining on the dorsal right first and second toe interspace and the plantar left first second toe and her face on the left 06/09/17; wound on the left lateral leg continues to be smaller but very gradual and very dry surface using Hydrofera Blue 06/16/17 requires weekly debridements now on the left lateral leg although this continues to contract. I changed to silver collagen last week because of dryness Wilshire, Dimitrius [name] ([id number]) [id number] Page 23 of 38 of the wound bed. Using Iodoflex to the areas on his first and second toes/web space bilaterally 06/24/17; patient with history of  paraplegia also chronic venous insufficiency with lymphedema. Has [name] very difficult wound on the left lateral leg. This has been gradually reducing in terms of with but comes in with [name] very dry adherent surface. High switch to silver collagen [name] week or so ago with hydrogel to keep the area moist. This is been refractory to multiple dressing attempts. He also has areas in his first and second toes bilaterally in the anterior and posterior web space. I had been using Iodoflex here after [name] prolonged course of silver alginate with ketoconazole was ineffective [question tinea  pedis] 07/14/17; patient arrives today with [name] very difficult adherent material over his left lateral lower leg wound. He also has surrounding erythema and poorly controlled edema. He was switched his Santyl last visit which the nurses are applying once during his doctor visit and once on [name] nurse visit. He was also reduced to 2 layer compression I'm not exactly sure of the issue here. 07/21/17; better surface today after 1 week of Iodoflex. Significant cellulitis that we treated last week also better. [Doxycycline] 07/28/17 better surface today with now 2 weeks of Iodoflex. Significant cellulitis treated with doxycycline. He has now completed the doxycycline and he is back to his usual degree of chronic venous inflammation/stasis dermatitis. He reminds me he has had ablations surgery here 08/04/17; continued improvement with Iodoflex to the left lateral leg wound in terms of the surface of the wound although the dimensions are better. 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 Problematic areas on the plantar aspect of the first second toe web space on the left and the dorsal aspect of the first second toe web space on the right. At one point I felt these were probably related to chronic fungal infections in treated him aggressively for this although we have not made any  improvement here. 08/11/17; left lateral leg. Surface continues to improve with the Iodoflex although we are not seeing much improvement in overall wound dimensions. Areas on his plantar left foot and right foot show no improvement. In fact the right foot looks somewhat worse 08/18/17; left lateral leg. We changed to [facility] Blue last week after [name] prolonged course of Iodoflex which helps get the surface better. It appears that the wound with is improved. Continue with difficult areas on the left dorsal first second and plantar first second on the right 09/01/17; patient arrives in clinic today having had [name] temperature of 103 yesterday. He was seen in the ER and [facility]. The patient was concerned he could have cellulitis again in the right leg however they diagnosed him with [name] UTI and he is now on Keflex. He has [name] history of cellulitis which is been recurrent and difficult but this is been in the left leg, in the past 5 use doxycycline. He does in and out catheterizations at home which are risk factors for UTI 09/08/17; patient will be completing his Keflex this weekend. The erythema on the left leg is considerably better. He has [name] new wound today on the medial part of the right leg small superficial almost looks like [name] skin tear. He has worsening of the area on the right dorsal first and second toe. His major area on the left lateral leg is better. Using Hydrofera Blue on all areas 09/15/17; gradual reduction in width on the long wound in the left lateral leg. No debridement required. He also has wounds on the plantar aspect of his left first second toe web space and on the dorsal aspect of the right first second toe web space. 09/22/17; there continues to be very gradual improvements in the dimensions of the left lateral leg wound. He hasn't round erythematous spot with might be pressure on his wheelchair. There is no evidence obviously of infection no purulence no warmth ooHe has [name] dry scaled area  on the plantar aspect of the left first second toe ooImproved area on the dorsal right first second toe. 09/29/17; left lateral leg wound continues to improve in dimensions mostly with an is  still [name] fairly long but increasingly narrow wound. ooHe has [name] dry scaled area on the plantar aspect of his left first second toe web space ooIncreasingly concerning area on the dorsal right first second toe. In fact I am concerned today about possible cellulitis around this wound. The areas extending up his second toe and although there is deformities here almost appears to abut on the nailbed. 10/06/17; left lateral leg wound continues to make very gradual progress. Tissue culture I did from the right first second toe dorsal foot last time grew MRSA and enterococcus which was vancomycin sensitive. This was not sensitive to clindamycin or doxycycline. He is allergic to Zyvox and sulfa we have therefore arrange for him to have dalvance infusion tomorrow. He is had this in the past and tolerated it well 10/20/17; left lateral leg wound continues to make decent progress. This is certainly reduced in terms of with there is advancing epithelialization.ooThe cellulitis in the right foot looks better although he still has [name] deep wound in the dorsal aspect of the first second toe web space. Plantar left first toe web space on the left I think is making some progress 10/27/17; left lateral leg wound continues to make decent progress. Advancing epithelialization.using Hydrofera Blue ooThe right first second toe web space wound is better-looking using silver alginate ooImprovement in the left plantar first second toe web space. Again using silver alginate 11/03/17 left lateral leg wound continues to make decent progress albeit slowly. Using Hydrofera Blue ooThe right per second toe web space continues to be [name] very problematic looking punched out wound. I obtained [name] piece of tissue for deep culture I did extensively  treated this for fungus. It is difficult to imagine that this is [name] pressure area as the patient states other than going outside he doesn't really wear shoes at home ooThe left plantar first second toe web space looked fairly senescent. Necrotic edges. This required debridement oochange to Hydrofera Blue to all wound areas 11/10/17; left lateral leg wound continues to contract. Using Hydrofera Blue ooOn the right dorsal first second toe web space dorsally. Culture I did of this area last week grew MRSA there is not an easy oral option in this patient was multiple antibiotic allergies or intolerances. This was only [name] rare culture isolate I'm therefore going to use Bactroban under silver alginate ooOn the left plantar first second toe web space. Debridement is required here. This is also unchanged 11/17/17; left lateral leg wound continues to contract using Hydrofera Blue this is no longer the major issue. ooThe major concern here is the right first second toe web space. He now has an open area going from dorsally to the plantar aspect. There is now wound on the inner lateral part of the first toe. Not [name] very viable surface on this. There is erythema spreading medially into the forefoot. ooNo major change in the left first second toe plantar wound 11/24/17; left lateral leg wound continues to contract using Hydrofera Blue. Nice improvement today ooThe right first second toe web space all of this looks [name] lot less angry than last week. I have given him clindamycin and topical Bactroban for MRSA and terbinafine for the possibility of underlining tinea pedis that I could not control with ketoconazole. Looks somewhat better ooThe area on the plantar left first second toe web space is weeping with dried debris around the wound 12/01/17; left lateral leg wound continues to contract he Hydrofera Blue. It is becoming thinner in terms of  with nevertheless it is making good improvement. ooThe right first  second toe web space looks less angry but still [name] large necrotic-looking wounds starting on the plantar aspect of the right foot extending between the toes and now extensively on the base of the right second toe. I gave him clindamycin and topical Bactroban for MRSA anterior benefiting for the possibility of underlying tinea pedis. Not looking better today ooThe area on the left first/second toe looks better. Debrided of necrotic debris 12/05/17* the patient was worked in urgently today because over the weekend he found blood on his incontinence bad when he woke up. He was found to have an ulcer by his wife who does most of his wound care. He came in today for [place] to look at this. He has not had [name] history of wounds in his buttocks in spite of his paraplegia. 12/08/17; seen in follow-up today at his usual appointment. He was seen earlier this week and found to have [name] new wound on his buttock. We also follow him for wounds on the left lateral leg, left first second toe web space and right first second toe web space 12/15/17; we have been using Hydrofera Blue to the left lateral leg which has improved. The right first second toe web space has also improved. Left first second toe web space plantar aspect looks stable. The left buttock has worsened using Santyl. Apparently the buttock has drainage 12/22/17; we have been using Hydrofera Blue to the left lateral leg which continues to improve now 2 small wounds separated by normal skin. He tells [place] he had [name] fever up to 100 yesterday he is prone to UTIs but has not noted anything different. He does in and out catheterizations. The area between the first and second toes today does not look good necrotic surface covered with what looks to be purulent drainage and erythema extending into the third toe. I had gotten this to something that I thought look better last time however it is not look good today. He also has [name] necrotic surface over the buttock wound which is  expanded. I thought there might be infection under here so I removed [name] lot of the surface with [name] #5 curet though nothing look like it really needed culturing. He is been using Santyl to this area 12/27/17; his original wound on the left lateral leg continues to improve using Hydrofera Blue. I gave him samples of Baxdella although he was unable to take them out of fear for an allergic reaction ["lump in his throat"].the culture I did of the purulent drainage from his second toe last week showed both enterococcus and [name] set Enterobacter I was also concerned about the erythema on the bottom of his foot although paradoxically although this looks somewhat better today. 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 We've been using silver alginate here. They came up today that he sleeps in [name] recliner, I'm not sure why but I asked him to stop this 01/03/18; his original wound we've been using Hydrofera Blue is now separated into 2 areas. ooUlcer on his left buttock is better he is off the recliner and sleeping in bed ooFinally both wound areas between his first and second toes also looks some better Borges, Satvik [name] ([id number]) [phone].pdf Page 24 of 38 01/10/18; his original wound on the left lateral leg is now separated into 2 wounds we've been using Hydrofera Blue   ooUlcer on his left buttock has some drainage. There is [name] small probing site going into muscle layer superiorly.using silver alginate -He arrives today with [name] deep tissue injury on the left heel ooThe wound on the dorsal aspect of his first second toe on the left looks [name] lot betterusing silver alginate ketoconazole ooThe area on the first second toe web space on the right also looks [name] lot bette 01/17/18; his original wound on the left lateral leg continues to progress using Hydrofera Blue ooUlcer on his left buttock also is  smaller surface healthier except for [name] small probing site going into the muscle layer superiorly. 2.4 cm of tunneling in this area ooDTI on his left heel we have only been offloading. Looks better than last week no threatened open no evidence of infection oothe wound on the dorsal aspect of the first second toe on the left continues to look like it's regressing we have only been using silver alginate and terbinafine orally ooThe area in the first second toe web space on the right also looks to be [name] lot better using silver alginate and terbinafine I think this was prompted by tinea pedis 01/31/18; the patient was hospitalized in [place] last week apparently for [name] complicated UTI. He was discharged on cefepime he does in and out catheterizations. In the hospital he was discovered M I don't mild elevation of AST and ALT and the terbinafine was stopped.predictably the pressure ulcer on s his buttock looks betterusing silver alginate. The area on the left lateral leg also is better using Hydrofera Blue. The area between the first and second toes on the left better. First and second toes on the right still substantial but better. Finally the DTI on the left heel has held together and looks like it's resolving 02/07/18-he is here in follow-up evaluation for multiple ulcerations. He has new injury to the lateral aspect of the last issue [name] pressure ulcer, he states this is from adhesive removal trauma. He states he has tried multiple adhesive products with no success. All other ulcers appear stable. The left heel DTI is resolving. We will continue with same treatment plan and follow-up next week. 02/14/18; follow-up for multiple areas. ooHe has [name] new area last week on the lateral aspect of his pressure ulcer more over the posterior trochanter. The original pressure ulcer looks quite stable has healthy granulation. 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 Almost fully epithelialized on the original superior area using Hydrofera Blue ooDTI on the left heel has peeled off this week to reveal [name] small superficial wound under denuded skin and subcutaneous tissue ooBoth areas between the first and second toes look better including nothing open on the left 02/21/18; ooThe patient's wounds on his left ischial tuberosity and posterior left greater trochanter actually looked better. He has [name] large area of irritation around the area which I think is contact dermatitis. I am doubtful that this is fungal ooHis original wound on the left lateral calf continues to improve we have been using Hydrofera Blue ooThere is no open area in the left first second toe web space although there is [name] lot of thick callus ooThe DTI on the left heel required debridement today of necrotic surface eschar and subcutaneous tissue using silver alginate ooFinally the area on the right first second toe webspace continues to contract using silver alginate and ketoconazole 02/28/18 ooLeft ischial tuberosity wounds  look better using silver alginate. ooOriginal wound on the left calf only has one small open area left using Hydrofera Blue ooDTI on the left heel required debridement mostly removing skin from around this wound surface. Using silver alginate ooThe areas on the right first/second toe web space using silver alginate and ketoconazole 03/08/18 on evaluation today patient appears to be doing decently well as best I can tell in regard to his wounds. This is the first time that I have seen him as he generally is followed by Dr. Leanord Hawking. With that being said none of his wounds appear to be infected he does have an area where there is some skin covering what appears to be [name] new wound on the left dorsal surface of his great toe. This is right at the nail bed. With that being said I do believe that debrided away some of the  excess skin can be of benefit in this regard. Otherwise he has been tolerating the dressing changes without complication. 03/14/18; patient arrives today with the multiplicity of wounds that we are following. He has not been systemically unwell ooOriginal wound on the left lateral calf now only has 2 small open areas we've been using Hydrofera Blue which should continue ooThe deep tissue injury on the left heel requires debridement today. We've been using silver alginate ooThe left first second toe and the right first second toe are both are reminiscence what I think was tinea pedis. Apparently some of the callus Surface between the toes was removed last week when it started draining. ooPurulent drainage coming from the wound on the ischial tuberosity on the left. 03/21/18-He is here in follow-up evaluation for multiple wounds. 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 He tolerated debridement. The only change to last week's recommendations is to discontinue antifungal cream between toes. He will follow-up next week 03/28/18; following up for multiple wounds;Concern this week is streaking redness and swelling in the right foot. He is going to need antibiotics for this. 03/31/18; follow-up for right foot cellulitis. Streaking redness and swelling in the right foot on 03/28/18. 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 I put him on clindamycin 300 mg every 6 and brought him in for [name] quick check. He has an open wound between his first and second toes on the right foot as [name] potential source. 04/04/18; ooRight foot cellulitis is resolving he is completing clindamycin. This is truly good news ooLeft lateral calf wound which is initial wound only has one small open area inferiorly this is close to healing out. He has compression stockings. We will use Hydrofera Blue right down to the epithelialization of  this ooNonviable surface on the left heel which was initially pressure with [name] DTI. We've been using Hydrofera Blue. I'm going to switch this back to silver alginate ooLeft first second toe/tinea pedis this looks better using silver alginate ooRight first second toe tinea pedis using silver alginate ooLarge pressure ulcers on theLeft ischial tuberosity. Small wound here Looks better. I am uncertain about the surface over the large wound. Using silver alginate 04/11/18; ooCellulitis in the right foot is resolved ooLeft lateral calf wound which was his original wounds still has 2 tiny open areas remaining this is just about closed ooNonviable surface on the left heel is better but still requires debridement ooLeft first second toe/tinea pedis still open using silver alginate ooRight first second toe wound tinea pedis I asked him to  go back to using ketoconazole and silver alginate ooLarge pressure ulcers on the left ischial tuberosity this shear injury here is resolved. Wound is smaller. No evidence of infection using silver alginate 04/18/18; ooPatient arrives with an intense area of cellulitis in the right mid lower calf extending into the right heel area. Bright red and warm. Smaller area on the left anterior leg. He has [name] significant history of MRSA. He will definitely need antibioticsoodoxycycline ooHe now has 2 open areas on the left ischial tuberosity the original large wound and now [name] satellite area which I think was above his initial satellite areas. Not [name] wonderful surface on this satellite area surrounding erythema which looks like pressure related. ooHis left lateral calf wound again his original wound is just about closed ooLeft heel pressure injury still requiring debridement ooLeft first second toe looks [name] lot better using silver alginate ooRight first second toe also using silver alginate and ketoconazole cream also looks better 04/20/18; the patient was worked in  early today out of concerns with his cellulitis on the right leg. I had started him on doxycycline. This was 2 days ago. His wife was concerned about the swelling in the area. Also concerned about the left buttock. He has not been systemically unwell no fever chills. No nausea vomiting or diarrhea 04/25/18; the patient's left buttock wound is continued to deteriorate he is using Hydrofera Blue. He is still completing clindamycin for the cellulitis on the right leg although all of this looks better. 05/02/18 ooLeft buttock wound still with [name] lot of drainage and [name] very tightly adherent fibrinous necrotic surface. He has [name] deeper area superiorly ooThe left lateral calf wound is still closed Steinhilber, Derral [name] ([id number]) [id number] Page 25 of 38 ooDTI wound on the left heel necrotic surface especially the circumference using Iodoflex ooAreas between his left first second toe and right first second toe both look better. Dorsally and the right first second toe he had [name] necrotic surface although at smaller. In using silver alginate and ketoconazole. I did [name] culture last week which was [name] deep tissue culture of the reminiscence of the open wound on the right first second toe dorsally. This grew [name] few Acinetobacter and [name] few methicillin-resistant staph aureus. Nevertheless the area actually this week looked better. I didn't feel the need to specifically address this at least in terms of systemic antibiotics. 05/09/18; wounds are measuring larger more drainage per our intake. We are using Santyl covered with alginate on the large superficial buttock wounds, Iodosorb on the left heel, ketoconazole and silver alginate to the dorsal first and second toes bilaterally. 05/16/18; ooThe area on his left buttock better in some aspects although the area superiorly over the ischial tuberosity required an extensive debridement.using Santyl ooLeft heel appears stable. Using Iodoflex ooThe  areas between his first and second toes are not bad however there is spreading erythema up the dorsal aspect of his left foot this looks like cellulitis again. He is insensate the erythema is really very brilliant.o Erysipelas He went to see an allergist days ago because he was itching part of this he had lab work done. This showed [name] white count of 15.1 with 70% neutrophils. Hemoglobin of 11.4 and [name] platelet count of 659,000. Last white count we had in Epic was [name] 2-1/2 years ago which was 25.9 but he was ill at the time. He was able to show me some lab work that was done by his primary physician the pattern is about  the same. 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 But prompted me to go ahead and do x-rays of both feet and the pelvis rule out osteomyelitis. He also had [name] comprehensive metabolic panel this was reasonably normal his albumin was 3.7 liver function tests BUN/creatinine all normal 05/23/18; x-rays of both his feet from last week were negative for underlying pulmonary abnormality. The x-ray of his pelvis however showed mild irregularity in the left ischial which may represent some early osteomyelitis. The wound in the left ischial continues to get deeper clearly now exposed muscle. Each week necrotic surface material over this area. Whereas the rest of the wounds do not look so bad. ooThe left ischial wound we have been using Santyl and calcium alginate ooT the left heel surface necrotic debris using Iodoflex o ooThe left lateral leg is still healed ooAreas on the left dorsal foot and the right dorsal foot are about the same. There is some inflammation on the left which might represent contact dermatitis, fungal dermatitis I am doubtful cellulitis although this looks better than last week 05/30/18; CT scan done at Hospital did not show any osteomyelitis or abscess. Suggested the possibility of underlying cellulitis although I don't see [name] lot  of evidence of this at the bedside ooThe wound itself on the left buttock/upper thigh actually looks somewhat better. No debridement ooLeft heel also looks better no debridement continue Iodoflex ooBoth dorsal first second toe spaces appear better using Lotrisone. Left still required debridement 06/06/18; ooIntake reported some purulent looking drainage from the left gluteal wound. Using Santyl and calcium alginate ooLeft heel looks better although still [name] nonviable surface requiring debridement ooThe left dorsal foot first/second webspace actually expanding and somewhat deeper. I may consider doing [name] shave biopsy of this area ooRight dorsal foot first/second webspace appears stable to improved. Using Lotrisone and silver alginate to both these areas 06/13/18 ooLeft gluteal surface looks better. Now separated in the 2 wounds. No debridement required. Still drainage. We'll continue silver alginate ooLeft heel continues to look better with Iodoflex continue this for at least another week ooOf his dorsal foot wounds the area on the left still has some depth although it looks better than last week. We've been using Lotrisone and silver alginate 06/20/18 ooLeft gluteal continues to look better healthy tissue ooLeft heel continues to look better healthy granulation wound is smaller. He is using Iodoflex and his long as this continues continue the Iodoflex ooDorsal right foot looks better unfortunately dorsal left foot does not. There is swelling and erythema of his forefoot. He had minor trauma to this several days ago but doesn't think this was enough to have caused any tissue injury. Foot looks like cellulitis, we have had this problem before 06/27/18 on evaluation today patient appears to be doing [name] little worse in regard to his foot ulcer. Unfortunately it does appear that he has methicillin-resistant staph aureus and unfortunately there really are no oral options for him as he's allergic  to sulfa drugs as well as I box. Both of which would really be his only options for treating this infection. In the past he has been given and effusion of Orbactiv. This is done very well for him in the past again it's one time dosing IV antibiotic therapy. Subsequently I do believe this is something we're gonna need to see about doing at this point in time. Currently his other wounds seem to be doing somewhat better in my  pinion I'm pretty happy in that regard. 07/03/18 on evaluation today patient's wounds actually appear to be doing fairly well. He has been tolerating the dressing changes without complication. All in all he seems to be showing signs of improvement. In regard to the antibiotics he has been dealing with infectious disease since I saw him last week as far as getting this scheduled. In the end [facility]'s [facility] this coming Friday. In the meantime he has been continuing to perform the dressing changes in such as previous. There does not appear to be any evidence of infection worsengin at this time. 07/10/18; ooSince I last saw this man 2 weeks ago things have actually improved. IV antibiotics of resulted in less forefoot erythema although there is still some present. He is not systemically unwell ooLeft buttock wounds o2 now have no depth there is increased epithelialization Using silver alginate ooLeft heel still requires debridement using Iodoflex ooLeft dorsal foot still with [name] sizable wound about the size of [name] border but healthy granulation ooRight dorsal foot still with [name] slitlike area using silver alginate 07/18/18; the patient's cellulitis in the left foot is improved in fact I think it is on its way to resolving. ooLeft buttock wounds o2 both look better although the larger one has hypertension granulation we've been using silver alginate ooLeft heel has some thick circumferential redundant skin over the wound edge  which will need to be removed today we've been using Iodoflex ooLeft dorsal foot is still [name] sizable wound required debridement using silver alginate ooThe right dorsal foot is just about closed only [name] small open area remains here 07/25/18; left foot cellulitis is resolved ooLeft buttock wounds o2 both look better. Hyper-granulation on the major area ooLeft heel as some debris over the surface but otherwise looks [name] healthier wound. Using silver collagen ooRight dorsal foot is just about closed 07/31/18; arrives with our intake nurse worried about purulent drainage from the buttock. We had hyper-granulation here last week ooHis buttock wounds o2 continue to look better ooLeft heel some debris over the surface but measuring smaller. ooRight dorsal foot unfortunately has openings between the toes ooLeft foot superficial wound looks less aggravated. 08/07/18 ooButtock wounds continue to look better although some of her granulation and the larger medial wound. silver alginate ooLeft heel continues to look [name] lot better.silver collagen ooLeft foot superficial wound looks less stable. Requires debridement. He has [name] new wound superficial area on the foot on the lateral dorsal foot. ooRight foot looks better using silver alginate without Lotrisone 08/14/2018; patient was in the ER last week diagnosed with [name] UTI. He is now on Cefpodoxime and Macrodantin. ooButtock wounds continued to be smaller. Using silver alginate ooLeft heel continues to look better using silver collagen ooLeft foot superficial wound looks as though it is improving ooRight dorsal foot area is just about healed. 08/21/2018; patient is completed his antibiotics for his UTI. ooHe has 2 open areas on the buttocks. There is still not closed although the surface looks satisfactory. Using silver alginate ooLeft heel continues to improve using silver collagen Schillaci, Malachi [name] ([id number]) [id number]  Page 26 of 38 ooThe bilateral dorsal foot areas which are at the base of his first and second toes/possible tinea pedis are actually stable on the left but worse on the right. The area on the left required debridement of necrotic surface. After debridement I obtained [name] specimen for PCR culture. ooThe right dorsal  foot which is been just about healed last week is now reopened 08/28/2018; culture done on the left dorsal foot showed coag negative staph both staph epidermidis and Lugdunensis. I think this is worthwhile initiating systemic treatment. I will use doxycycline given his long list of allergies. The area on the left heel slightly improved but still requiring debridement. ooThe large wound on the buttock is just about closed whereas the smaller one is larger. Using silver alginate in this area 09/04/2018; patient is completing his doxycycline for the left foot although this continues to be [name] very difficult wound area with very adherent necrotic debris. We are using silver alginate to all his wounds right foot left foot and the small wounds on his buttock, silver collagen on the left heel. 09/11/2018; once again this patient has intense erythema and swelling of the left forefoot. Lesser degrees of erythema in the right foot. He has [name] long list of allergies and intolerances. I will reinstitute doxycycline. 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 Using silver alginate ooLeft heel also looks better using silver collagen ooUnfortunately both the areas on his feet look worse. The area on the left first second webspace is now gone through to the plantar part of his foot. The area on the left foot anteriorly is irritated with erythema and swelling in the forefoot. 09/25/2018 ooHis wound on the left plantar heel looks better. Using silver collagen ooThe area on the left buttock 2 small remnant areas. One is closed one is still open. Using silver  alginate ooThe areas between both his first and second toes look worse. This in spite of long-standing antifungal therapy with ketoconazole and silver alginate which should have antifungal activity ooHe has small areas around his original wound on the left calf one is on the bottom of the original scar tissue and one superiorly both of these are small and superficial but again given wound history in this site this is worrisome 10/02/2018 ooLeft plantar heel continues to gradually contract using silver collagen ooLeft buttock wound is unchanged using silver alginate ooThe areas on his dorsal feet between his first and second toes bilaterally look about the same. 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 Small open area here. We have been using silver alginate 10/09/2018; ooLeft plantar heel continues to look healthy and contract. No debridement is required ooLeft buttock slightly smaller with [name] tape injury wound just below which was new this week ooDorsal feet somewhat improved I have been using clindamycin ooLeft lateral looks lower extremity the actual open area looks worse although [name] lot of this is epithelialized. I am going to change to silver collagen today He has [name] lot more swelling in the right leg although this is not pitting not red and not particularly warm there is [name] lot of spasm in the right leg usually indicative of people with paralysis of some underlying discomfort. We have reviewed his vascular status from 2017 he had [name] left greater saphenous vein ablation. I wonder about referring him back to vascular surgery if the area on the left leg continues to deteriorate. 10/16/2018 in today for follow-up and management of multiple lower extremity ulcers. His left Buttock wound is much lower smaller and  almost closed completely. The wound to the left ankle has  began to reopen with Epithelialization and some adherent slough. He has multiple new areas to the left foot and leg. The left dorsal foot without much improvement. Wound present between left great webspace and 2nd toe. Erythema and edema present right leg. Right LE ultrasound obtained on 10/10/18 was negative for DVT . 10/23/2018; ooLeft buttock is closed over. Still dry macerated skin but there is no open wound. I suspect this is chronic pressure/moisture ooLeft lateral calf is quite [name] bit worse than when I saw this last. There is clearly drainage here he has macerated skin into the left plantar heel. We will change the primary dressing to alginate ooLeft dorsal foot has some improvement in overall wound area. Still using clindamycin and silver alginate ooRight dorsal foot about the same as the left using clindamycin and silver alginate ooThe erythema in the right leg has resolved. He is DVT rule out was negative ooLeft heel pressure area required debridement although the wound is smaller and the surface is health 10/26/2018 ooThe patient came back in for his nurse check today predominantly because of the drainage coming out of the left lateral leg with [name] recent reopening of his original wound on the left lateral calf. He comes in today with [name] large amount of surrounding erythema around the wound extending from the calf into the ankle and even in the area on the dorsal foot. He is not systemically unwell. He is not febrile. Nevertheless this looks like cellulitis. We have been using silver alginate to the area. I changed him to [name] regular visit and I am going to prescribe him doxycycline. The rationale here is [name] long list of medication intolerances and [name] history of MRSA. I did not see anything that I thought would provide [name] valuable culture 10/30/2018 ooFollow-up from his appointment 4 days ago with really an extensive area of  cellulitis in the left calf left lateral ankle and left dorsal foot. I put him on doxycycline. He has [name] long list of medication allergies which are true allergy reactions. 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 In any case he is [name] lot better today. The erythema especially in the anterior and lateral left calf is better. He still has left ankle erythema. He also is complaining about increasing edema in the right leg we have only been using Kerlix Coban and he has been doing the wraps at home. Finally he has [name] spotty rash on the medial part of his upper left calf which looks like folliculitis or perhaps wrap occlusion type injury. Small superficial macules not pustules 11/06/18 patient arrives today with again [name] considerable degree of erythema around the wound on the left lateral calf extending into the dorsal ankle and dorsal foot. This is [name] lot worse than when I saw this last week. He is on doxycycline really with not [name] lot of improvement. He has not been systemically unwell Wounds on the; left heel actually looks improved. Original area on the left foot and proximity to the first and second toes looks about the same. He has superficial areas on the dorsal foot, anterior calf and then the reopening of his original wound on the left lateral calf which looks about the same ooThe only area he has on the right is the dorsal webspace first and second which is smaller. ooHe has [name] large area of dry erythematous skin on the left buttock small open area here. 11/13/2018; the patient arrives  in much better condition. The erythema around the wound on the left lateral calf is [name] lot better. Not sure whether this was the clindamycin or the TCA and ketoconazole or just in the improvement in edema control [stasis dermatitis]. In any case this is [name] lot better. The area on the left heel is very small and just about resolved using silver collagen  we have been using silver alginate to the areas on his dorsal feet 11/20/2018; his wounds include the left lateral calf, left heel, dorsal aspects of both feet just proximal to the first second webspace. He is stable to slightly improved. I did not think any changes to his dressings were going to be necessary 11/27/2018 he has [name] reopening on the left buttock which is surrounded by what looks like tinea or perhaps some other form of dermatitis. The area on the left dorsal foot has some erythema around it I have marked this area but I am not sure whether this is cellulitis or not. Left heel is not closed. Left calf the reopening is really slightly longer and probably worse 1/13; in general things look better and smaller except for the left dorsal foot. Area on the left heel is just about closed, left buttock looks better only [name] small wound remains in the skin looks better [using Lotrisone] 1/20; the area on the left heel only has [name] few remaining open areas here. Left lateral calf about the same in terms of size, left dorsal foot slightly larger right lateral foot still not closed. The area on the left buttock has no open wound and the surrounding skin looks [name] lot better 1/27; the area on the left heel is closed. Left lateral calf better but still requiring extensive debridements. The area on his left buttock is closed. He still has the open areas on the left dorsal foot which is slightly smaller in the right foot which is slightly expanded. We have been using Iodoflex on these areas as well 2/3; left heel is closed. Left lateral calf still requiring debridement using Iodoflex there is no open area on his left buttock however he has dry scaly skin over [name] large area of this. Not really responding well to the Lotrisone. Finally the areas on his dorsal feet at the level of the first second webspace are slightly smaller on the right and about the same on the left. Both of these vigorously debrided with Anasept  and gauze 2/10; left heel remains closed he has dry erythematous skin over the left buttock but there is no open wound here. Left lateral leg has come in and with. Still Jahnke, Ciaran [name] ([id number]) [id number] Page 27 of 38 requiring debridement we have been using Iodoflex here. Finally the area on the left dorsal foot and right dorsal foot are really about the same extremely dry callused fissured areas. He does not yet have [name] dermatology appointment 2/17; left heel remains closed. He has [name] new open area on the left buttock. The area on the left lateral calf is bigger longer and still covered in necrotic debris. No major change in his foot areas bilaterally. I am awaiting for [name] dermatologist to look on this. We have been using ketoconazole I do not know that this is been doing any good at all. 2/24; left heel remains closed. The left buttock wound that was new reopening last week looks better. The left lateral calf appears better also although still requires debridement. The major area on his foot is the left  first second also requiring debridement. We have been putting Prisma on all wounds. I do not believe that the ketoconazole has done too much good for his feet. He will use Lotrisone I am going to give him [name] 2-week course of terbinafine. We still do not have [name] dermatology appointment 3/2 left heel remains closed however there is skin over bone in this area I pointed this out to him today. The left buttock wound is epithelialized but still does not look completely stable. The area on the left leg required debridement were using silver collagen here. With regards to his feet we changed to Lotrisone last week and silver alginate. 3/9; left heel remains closed. Left buttock remains closed. The area on the right foot is essentially closed. The left foot remains unchanged. Slightly smaller on the left lateral calf. Using silver collagen to both of these areas 3/16-Left heel  remains closed. Area on right foot is closed. Left lateral calf above the lateral malleolus open wound requiring debridement with easy bleeding. Left dorsal wound proximal to first toe also debrided. Left ischial area open new. Patient has been using Prisma with wrapping every 3 days. Dermatology appointment is apparently tomorrow.Patient has completed his terbinafine 2-week course with some apparent improvement according to him, there is still flaking and dry skin in his foot on the left 3/23; area on the right foot is reopened. The area on the left anterior foot is about the same still [name] very necrotic adherent surface. He still has the area on the left leg and reopening is on the left buttock. He apparently saw dermatology although I do not have [name] note. According to the patient who is usually fairly well informed they did not have any good ideas. Put him on oral terbinafine which she is been on before. 3/30; using silver collagen to all wounds. Apparently his dermatologist put him on doxycycline and rifampin presumably some culture grew staph. I do not have this result. He remains on terbinafine although I have used terbinafine on him before 4/6; patient has had [name] fairly substantial reopening on the right foot between the first and second toes. He is finished his terbinafine and I believe is on doxycycline and rifampin still as prescribed by dermatology. We have been using silver collagen to all his wounds although the patient reports that he thinks silver alginate does better on the wounds on his buttock. 4/13; the area on his left lateral calf about the same size but it did not require debridement. ooLeft dorsal foot just proximal to the webspace between the first and second toes is about the same. Still nonviable surface. I note some superficial bronze discoloration of the dorsal part of his foot ooRight dorsal foot just proximal to the first and second toes also looks about the same. I still  think there may be the same discoloration I noted above on the left ooLeft buttock wound looks about the same 4/20; left lateral calf appears to be gradually contracting using silver collagen. 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 The left dorsal foot wound is debrided of tightly adherent necrotic debris and really cleans up quite nicely. The right area is worse with expansion. I did not debride this it is now over the base of the second toe ooThe area on his left buttock is smaller no debridement is required using silver collagen 5/4; left calf continues to make good progress. ooHe arrives with erythema around the wounds on his  dorsal foot which even extends to the plantar aspect. Very concerning for coexistent infection. He is finished the erythromycin I gave him for possible erythrasma this does not seem to have helped. ooThe area on the left foot is about the same base of the dorsal toes ooIs area on the buttock looks improved on the left 5/11; left calf and left buttock continued to make good progress. Left foot is about the same to slightly improved. ooMajor problem is on the right foot. He has not had an x-ray. Deep tissue culture I did last week showed both Enterobacter and [name]. coli. I did not change the doxycycline I put him on empirically although neither 1 of these were plated to doxycycline. He arrives today with the erythema looking worse on both the dorsal and plantar foot. Macerated skin on the bottom of the foot. he has not been systemically unwell 5/18-Patient returns at 1 week, left calf wound appears to be making some progress, left buttock wound appears slightly worse than last time, left foot wound looks slightly better, right foot redness is marginally better. X-ray of both feet show no air or evidence of osteomyelitis. Patient is finished his Omnicef and terbinafine. He continues to have macerated skin on  the bottom of the left foot as well as right 5/26; left calf wound is better, left buttock wound appears to have multiple small superficial open areas with surrounding macerated skin. X-rays that I did last time showed no evidence of osteomyelitis in either foot. He is finished cefdinir and doxycycline. I do not think that he was on terbinafine. He continues to have [name] large superficial open area on the right foot anterior dorsal and slightly between the first and second toes. I did send him to dermatology 2 months ago or so wondering about whether they would do [name] fungal scraping. I do not believe they did but did do [name] culture. We have been using silver alginate to the toe areas, he has been using antifungals at home topically either ketoconazole or Lotrisone. We are using silver collagen on the left foot, silver alginate on the right, silver collagen on the left lateral leg and silver alginate on the left buttock 6/1; left buttock area is healed. We have the left dorsal foot, left lateral leg and right dorsal foot. We are using silver alginate to the areas on both feet and silver collagen to the area on his left lateral calf 6/8; the left buttock apparently reopened late last week. He is not really sure how this happened. He is tolerating the terbinafine. Using silver alginate to all wounds 6/15; left buttock wound is larger than last week but still superficial. ooCame in the clinic today with [name] report of purulence from the left lateral leg I did not identify any infection ooBoth areas on his dorsal feet appear to be better. He is tolerating the terbinafine. Using silver alginate to all wounds 6/22; left buttock is about the same this week, left calf quite [name] bit better. His left foot is about the same however he comes in with erythema and warmth in the right forefoot once again. Culture that I gave him in the beginning of May showed Enterobacter and [name]. coli. I gave him doxycycline and things seem  to improve although neither 1 of these organisms was specifically plated. 6/29; left buttock is larger and dry this week. Left lateral calf looks to me to be improved. Left dorsal foot also somewhat improved right foot completely unchanged. The erythema  on the right foot is still present. He is completing the Ceftin dinner that I gave him empirically [see discussion above.) 7/6 - All wounds look to be stable and perhaps improved, the left buttock wound is slightly smaller, per patient bleeds easily, completed ceftin, the right foot redness is less, he is on terbinafine 7/13; left buttock wound about the same perhaps slightly narrower. Area on the left lateral leg continues to narrow. Left dorsal foot slightly smaller right foot about the same. We are using silver alginate on the right foot and Hydrofera Blue to the areas on the left. Unna boot on the left 2 layer compression on the right 7/20; left buttock wound absolutely the same. Area on lateral leg continues to get better. Left dorsal foot require debridement as did the right no major change in the 7/27; left buttock wound the same size necrotic debris over the surface. The area on the lateral leg is closed once again. His left foot looks better right foot about the same although there is some involvement now of the posterior first second toe area. He is still on terbinafine which I have given him for [name] month, not certain [name] centimeter major change 06/25/19-All wounds appear to be slightly improved according to report, left buttock wound looks clean, both foot wounds have minimal to no debris the right dorsal foot has minimal slough. We are using Hydrofera Blue to the left and silver alginate to the right foot and ischial wound. 8/10-Wounds all appear to be around the same, the right forefoot distal part has some redness which was not there before, however the wound looks clean and small. Ischial wound looks about the same with no changes 8/17;  his wound on the left lateral calf which was his original chronic venous insufficiency wound remains closed. Since I last saw him the areas on the left dorsal foot right dorsal foot generally appear better but require debridement. The area on his left initial tuberosity appears somewhat larger to me perhaps hyper granulated and bleeds very easily. We have been using Hydrofera Blue to the left dorsal foot and silver alginate to everything else 8/24; left lateral calf remains closed. The areas on his dorsal feet on the webspace of the first and second toes bilaterally both look better. The area on the left buttock which is the pressure ulcer stage II slightly smaller. I change the dressing to Hydrofera Blue to all areas 8/31; left lateral calf remains closed. The area on his dorsal feet bilaterally look better. Using Hydrofera Blue. Still requiring debridement on the left foot. No change in the left buttock pressure ulcers however Chapple, Draxton [name] ([id number]) [id number] Page 28 of 38 9/14; left lateral calf remains closed. Dorsal feet look quite [name] bit better than 2 weeks ago. Flaking dry skin also [name] lot better with the ammonium lactate I gave him 2 weeks ago. The area on the left buttock is improved. He states that his Roho cushion developed [name] leak and he is getting [name] new one, in the interim he is offloading this vigorously 9/21; left calf remains closed. Left heel which was [name] possible DTI looks better this week. He had macerated tissue around the left dorsal foot right foot looks satisfactory and improved left buttock wound. I changed his dressings to his feet to silver alginate bilaterally. Continuing Hydrofera Blue on the left buttock. 9/28 left calf remains closed. Left heel did not develop anything [possible DTI] dry flaking skin on the left dorsal foot. Right  foot looks satisfactory. Improved left buttock wound. We are using silver alginate on his feet Hydrofera Blue on  the buttock. I have asked him to go back to the Lotrisone on his feet including the wounds and surrounding areas 10/5; left calf remains closed. The areas on the left and right feet about the same. [name] lot of this is epithelialized however debris over the remaining open areas. He is using Lotrisone and silver alginate. The area on the left buttock using Hydrofera Blue 10/26. Patient has been out for 3 weeks secondary to Covid concerns. He tested negative but I think his wife tested positive. He comes in today with the left foot substantially worse, right foot about the same. Even more concerning he states that the area on his left buttock closed over but then reopened and is considerably deeper in one aspect than it was before [stage III wound] 11/2; left foot really about the same as last week. Quarter sized wound on the dorsal foot just proximal to the first second toes. Surrounding erythema with areas of denuded epithelium. This is not really much different looking. Did not look like cellulitis this time however. ooRight foot area about the same.. We have been using silver alginate alginate on his toes ooLeft buttock still substantial irritated skin around the wound which I think looks somewhat better. We have been using Hydrofera Blue here. 11/9; left foot larger than last week and [name] very necrotic surface. Right foot I think is about the same perhaps slightly smaller. Debris around the circumference also addressed. Unfortunately on the left buttock there is been [name] decline. Satellite lesions below the major wound distally and now [name] an additional one posteriorly we have been using Hydrofera Blue but I think this is [name] pressure issue 11/16; left foot ulcer dorsally again [name] very adherent necrotic surface. Right foot is about the same. Not much change in the pressure ulcer on his left buttock. 11/30; left foot ulcer dorsally basically the same as when I saw him 2 weeks ago. Very adherent fibrinous  debris on the wound surface. Patient reports [name] lot of drainage as well. 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 We have been using Iodoflex, patient changed back to alginate because of the drainage. Area on his right dorsal foot really looks benign with [name] healthier surface certainly [name] lot better than on the left. Left buttock wounds all improved using Hydrofera Blue 12/7; left dorsal foot again no improvement. Tightly adherent debris. PCR culture I did last week only showed likely skin contaminant. I have gone ahead and done [name] punch biopsy of this which is about the last thing in terms of investigations I can think to do. He has known venous insufficiency and venous hypertension and this could be the issue here. The area on the right foot is about the same left buttock slightly worse according to our intake nurse secondary to [facility] Blue sticking to the wound 12/14; biopsy of the left foot that I did last time showed changes that could be related to wound healing/chronic stasis dermatitis phenomenon no neoplasm. We have been using silver alginate to both feet. I change the one on the left today to Sorbact and silver alginate to his other 2 wounds 12/28; the patient arrives with the following problems; ooMajor issue is the dorsal left foot which continues to be [name] larger deeper wound area. Still with [name] completely nonviable surface ooParadoxically the area mirror image on the  right on the right dorsal foot appears to be getting better. ooHe had some loss of dry denuded skin from the lower part of his original wound on the left lateral calf. Some of this area looked [name] little vulnerable and for this reason we put him in wrap that on this side this week ooThe area on his left buttock is larger. He still has the erythematous circular area which I think is [name] combination of pressure, sweat. This does not look like cellulitis or fungal  dermatitis 11/26/2019; -Dorsal left foot large open wound with depth. Still debris over the surface. Using Sorbact ooThe area on the dorsal right foot paradoxically has closed over [facility] has [name] reopening on the left ankle laterally at the base of his original wound that extended up into the calf. This appears clean. ooThe left buttock wound is smaller but with very adherent necrotic debris over the surface. We have been using silver alginate here as well The patient had arterial studies done in 2017. He had biphasic waveforms at the dorsalis pedis and posterior tibial bilaterally. ABI in the left was 1.17. Digit waveforms were dampened. He has slight spasticity in the great toes I do not think [name] TBI would be possible 1/11; the patient comes in today with [name] sizable reopening between the first and second toes on the right. This is not exactly in the same location where we have been treating wounds previously. According to our intake nurse this was actually fairly deep but 0.6 cm. The area on the left dorsal foot looks about the same the surface is somewhat cleaner using Sorbact, his MRI is in 2 days. We have not managed yet to get arterial studies. The new reopening on the left lateral calf looks somewhat better using alginate. The left buttock wound is about the same using alginate 1/18; the patient had his ARTERIAL studies which were quite normal. ABI in the right at 1.13 with triphasic/biphasic waveforms on the left ABI 1.06 again with triphasic/biphasic waveforms. It would not have been possible to have done [name] toe brachial index because of spasticity. We have been using Sorbac to the left foot alginate to the rest of his wounds on the right foot left lateral calf and left buttock 1/25; arrives in clinic with erythema and swelling of the left forefoot worse over the first MTP area. This extends laterally dorsally and but also posteriorly. Still has an area on the left lateral part of the lower part  of his calf wound it is eschared and clearly not closed. ooArea on the left buttock still with surrounding irritation and erythema. ooRight foot surface wound dorsally. The area between the right and first and second toes appears better. 2/1; ooThe left foot wound is about the same. Erythema slightly better I gave him [name] week of doxycycline empirically ooRight foot wound is more extensive extending between the toes to the plantar surface ooLeft lateral calf really no open surface on the inferior part of his original wound however the entire area still looks vulnerable ooAbsolutely no improvement in the left buttock wound required debridement. 2/8; the left foot is about the same. Erythema is slightly improved I gave him clindamycin last week. ooRight foot looks better he is using Lotrimin and silver alginate ooHe has [name] breakdown in the left lateral calf. Denuded epithelium which I have removed ooLeft buttock about the same were using Hydrofera Blue 2/15; left foot is about the same there is less surrounding erythema. Surface still has tightly  adherent debris which I have debriding however not making any progress ooRight foot has [name] substantial wound on the medial right second toe between the first and second webspace. ooStill an open area on the left lateral calf distal area. ooButtock wound is about the same 2/22; left foot is about the same less surrounding erythema. Surface has adherent debris. Polymen Ag Right foot area significant wound between the first and second toes. We have been using silver alginate here Left lateral leg polymen Ag at the base of his original venous insufficiency wound ooLeft buttock some improvement here 3/1; ooRight foot is deteriorating in the first second toe webspace. Larger and more substantial. We have been using silver alginate. ooLeft dorsal foot about the same markedly adherent surface debris using PolyMem Ag ooLeft lateral calf surface  debris using PolyMem AG ooLeft buttock is improved again using PolyMem Ag. ooHe is completing his terbinafine. The erythema in the foot seems better. He has been on this for 2 weeks 3/8; no improvement in any wound area in fact he has [name] small open area on the dorsal midfoot which is new this week. He has not gotten his foot x-rays yet 3/15; his x-rays were both negative for osteomyelitis of both feet. No major change in any of his wounds on the extremities however his buttock wounds are better. We have been using polymen on the buttocks, left lower leg. Iodoflex on the left foot and silver alginate on the right 3/22; arrives in clinic today with the 2 major issues are the improvement in the left dorsal foot wound which for once actually looks healthy with [name] nice healthy wound surface without debridement. Using Iodoflex here. Unfortunately on the left lateral calf which is in the distal part of his original wound he came to the [place], Shervin [name] ([id number]) [phone].pdf Page 29 of 38 clinic here for there was purulent drainage noted some increased breakdown scattered around the original area and [name] small area proximally. We we are using polymen here will change to silver alginate today. His buttock wound on the left is better and I think the area on the right first second toe webspace is also improved 3/29; left dorsal foot looks better. Using Iodoflex. Left ankle culture from deterioration last time grew [name]. coli, Enterobacter and Enterococcus. I will give him [name] course of cefdinir although that will not cover Enterococcus. The area on the right foot in the webspace of the first and second toe lateral first toe looks better. The area on his buttock is about healed Vascular appointment is on April 21. This is to look at his venous system vis--vis continued breakdown of the wounds on the left including the left lateral leg and left dorsal foot he. He has had previous ablations on  this side 4/5; the area between the right first and second toes lateral aspect of the first toe looks better. Dorsal aspect of the left first toe on the left foot also improved. Unfortunately the left lateral lower leg is larger and there is [name] second satellite wound superiorly. The usual superficial abrasions on the left buttock overall better but certainly not closed 4/12; the area between the right first and second toes is improved. Dorsal aspect of the left foot also slightly smaller with [name] vibrant healthy looking surface. No real change in the left lateral leg and the left buttock wound is healed He has an unaffordable co-pay for Apligraf. Appointment with vein and vascular with regards to the left leg venous  part of the circulation is on 4/21 4/19; we continue to see improvement in all wound areas. Although this is minor. He has his vascular appointment on 4/21. The area on the left buttock has not reopened although right in the center of this area the skin looks somewhat threatened 4/26; the left buttock is unfortunately reopened. In general his left dorsal foot has [name] healthy surface and looks somewhat smaller although it was not measured as such. The area between his first and second toe webspace on the right as [name] small wound against the first toe. The patient saw vascular surgery. The real question I was asking was about the small saphenous vein on the left. He has previously ablated left greater saphenous vein. Nothing further was commented on on the left. Right greater saphenous vein without reflux at the saphenofemoral junction or proximal thigh there was no indication for ablation of the right greater saphenous vein duplex was negative for DVT bilaterally. They did not think there was anything from [name] vascular surgery point of view that could be offered. They ABIs within normal limits 5/3; only small open area on the left buttock. The area on the left lateral leg which was his original  venous reflux is now 2 wounds both which look clean. We are using Iodoflex on the left dorsal foot which looks healthy and smaller. He is down to [name] very tiny area between the right first and second toes, using silver alginate 5/10; all of his wounds appear better. We have much better edema control in 4 layer compression on the left. This may be the factor that is allowing the left foot and left lateral calf to heal. He has external compression garments at home 04/14/20-All of his wounds are progressing well, the left forefoot is practically closed, left ischium appears to be about the same, right toe webspace is also smaller. The left lateral leg is about the same, continue using Hydrofera Blue to this, silver alginate to the ischium, Iodoflex to the toe space on the right 6/7; most of his wounds outside of the left buttock are doing well. The area on the left lateral calf and left dorsal foot are smaller. The area on the right foot in between the first and second toe webspace is barely visible although he still says there is some drainage here is the only reason I did not heal this out. ooUnfortunately the area on the left buttock almost looks like he has [name] skin tear from tape. He has open wound and then [name] large flap of skin that we are trying to get adherence over an area just next to the remaining wound 6/21; 2 week follow-up. I believe is been here for nurse visits. Miraculously the area between his first and second toes on the left dorsal foot is closed over. Still open on the right first second web space. The left lateral calf has 2 open areas. Distally this is more superficial. The proximal area had [name] little more depth and required debridement of adherent necrotic material. His buttock wound is actually larger we have been using silver alginate here 6/28; the patient's area on the left foot remains closed. Still open wet area between the first and second toes on the right and also extending into  the plantar aspect. We have been using silver alginate in this location. He has 2 areas on the left lower leg part of his original long wounds which I think are better. We have been using Hydrofera Blue  here. Kandis Mannan to the left buttock which is stable 7/12; left foot remains closed. Left ankle is closed. May be [name] small area between his right first and second toes the only truly open area is on the left buttock. We have been using Hydrofera Blue here 7/19; patient arrives with marked deterioration especially in the left foot and ankle. We did not put him in [name] compression wrap on the left last week in fact he wore his juxta lite stockings on either side although he does not have an underlying stocking. He has [name] reopening on the left dorsal foot, left lateral ankle and [name] new area on the right dorsal ankle. More worrisome is the degree of erythema on the left foot extending on the lateral foot into the lateral lower leg on the left 7/26; the patient had erythema and drainage from the lateral left ankle last week. Culture of this grew MRSA resistant to doxycycline and clindamycin which are the 2 antibiotics we usually use with this patient who has multiple antibiotic allergies including linezolid, trimethoprim sulfamethoxazole. I had give him an empiric doxycycline and he comes in the area certainly looks somewhat better although it is blotchy in his lower leg. He has not been systemically unwell. He has had areas on the left dorsal foot which is [name] reopening, chronic wounds on the left lateral ankle. Both of these I think are secondary to chronic venous insufficiency. The area between his first and second toes is closed as far as I can tell. He had [name] new wrap injury on the right dorsal ankle last week. Finally he has an area on the left buttock. We have been using silver alginate to everything except the left buttock we are using Hydrofera Blue 06/30/20-Patient returns at 1 week, has been given [name]  sample dose pack of NUZYRA which is [name] tetracycline derivative [omadacycline], patient has completed those, we have been using silver alginate to almost all the wounds except the left ischium where we are using Hydrofera Blue all of them look better 8/16; since I last saw the patient he has been doing well. The area on the left buttock, left lateral ankle and left foot are all closed today. He has completed the [place] I gave him last time and tolerated this well. He still has open areas on the right dorsal ankle and in the right first second toe area which we are using silver alginate. 8/23; we put him in his bilateral external compression stockings last week as he did not have anything open on either leg except for concerning area between the right first and second toe. He comes in today with an area on the left dorsal foot slightly more proximal than the original wound, the left lateral foot but this is actually [name] continuation of the area he had on the left lateral ankle from last time. As well he is opened up on the left buttock again. 8/30; comes in today with things looking [name] lot better. The area on the left lower ankle has closed down as has the left foot but with eschar in both areas. The area on the dorsal right ankle is also epithelialized. Very little remaining of the left buttock wound. We have been using silver alginate on all wound areas 9/13; the area in the first second toe webspace on the right has fully epithelialized. He still has some vulnerable epithelium on the right and the ankle and the dorsal foot. He notes weeping. He is using his  juxta lite stocking. On the left again the left dorsal foot is closed left lateral ankle is closed. We went to the juxta lite stocking here as well. ooStill vulnerable in the left buttock although only 2 small open areas remain here 9/27; 2-week follow-up. We did not look at his left leg but the patient says everything is closed. He is [name] bit disturbed  by the amount of edema in his left foot he is using juxta lite stockings but asking about over the toes stockings which would be 30/40, will talk to him next time. According to him there is no open wound on either the left foot or the left ankle/calf He has an open area on the dorsal right calf which I initially point [name] wrap injury. He has superficial remaining wound on the left ischial tuberosity been using silver alginate although he says this sticks to the wound 10/5; we gave him 2-week follow-up but he called yesterday expressing some concerns about his right foot right ankle and the left buttock. He came in early. There is still no open areas on the left leg and that still in his juxta lite stocking 10/11; he only has 1 small area on the left buttock that remains measuring millimeters 1 mm. Still has the same irritated skin in this area. We recommended zinc oxide when this eventually closes and pressure relief is meticulously is he can do this. He still has an area on the dorsal part of his right first through third toes which is [name] bit irritated and still open and on the dorsal ankle near the crease of the ankle. We have been using silver alginate and using his own stocking. He has nothing open on the left leg or foot 10/25; 2-week follow-up. Not nearly as good on the left buttock as I was hoping. For open areas with 5 looking threatened small. He has the erythematous irritated chronic skin in this area. oo1 area on the right dorsal ankle. He reports this area bleeds easily ooRight dorsal foot just proximal to the base of his toes ooWe have been using silver alginate. 11/8; 2-week follow-up. Left buttock is about the same although I do not think the wounds are in the same location we have been using silver alginate. I have asked him to use zinc oxide on the skin around the wounds. ooHe still has [name] small area on the right dorsal ankle he reports this bleeds easily ooRight dorsal foot just  proximal to the base of the toes does not have anything open although the skin is very dry and scaly ooHe has [name] new opening on the nailbed of the left great toe. Nothing on the left ankle 11/29; 3-week follow-up. Left buttock has 2 open areas. And washing of these wounds today started bleeding easily. Suggesting very friable tissue. We have Symonette, Macarius [name] ([id number]) [phone].pdf Page 30 of 38 been using silver alginate. Right dorsal ankle which I thought was initially [name] wrap injury we have been using silver alginate. Nothing open between the toes that I can see. He states the area on the left dorsal toe nailbed healed after the last visit in 2 or 3 days 12/13; 3-week follow-up. His left buttock now has 3 open areas but the original 2 areas are smaller using polymen here. Surrounding skin looks better. The right dorsal ankle is closed. He has [name] small opening on the right dorsal foot at the level of the third toe. In general the skin looks better here.  He is wearing his juxta lite stocking on the left leg says there is nothing open 11/24/2020; 3 weeks follow-up. His left buttock still has the 3 open areas. We have been using polymen but due to lack of response he changed to [facility] area. Surrounding skin is dry erythematous and irritated looking. There is no evidence of infection either bacterial or fungal however there is loss of surface epithelium ooHe still has very dry skin in his foot causing irritation and erythema on the dorsal part of his toes. This is not responded to prolonged courses of antifungal simply looks dry and irritated 1/24; left buttock area still looks about the same he was unable to find the triad ointment that we had suggested. The area on the right lower leg just above the dorsal ankle has reopened and the areas on the right foot between the first second and second third toes and scaling on the bottom of the foot has been about the same for  quite some time now. been using silver alginate to all wound areas 2/7; left buttock wound looked quite good although not much smaller in terms of surface area surrounding skin looks better. Only [name] few dry flaking areas on the right foot in between the first and second toes the skin generally looks better here [ammonium lactate]. Finally the area on the right dorsal ankle is closed 2/21; ooThere is no open area on the right foot even between the right first and second toe. Skin around this area dorsally and plantar aspects look better. ooHe has [name] reopening of the area on the right ankle just above the crease of the ankle dorsally. I continue to think that this is probably friction from spasms may be even this time with his stocking under the compression stockings. ooWounds on his left buttock look about the same there [name] couple of areas that have reopened. He has [name] total square area of loss of epithelialization. This does not look like infection it looks like [name] contact dermatitis but I just cannot determine to what 3/14; there is nothing on the right foot between the first and second toes this was carefully inspected under illumination. Some chronic irritation on the dorsal part of his foot from toes 1-3 at the base. Nothing really open here substantially. Still has an area on the right foot/ankle that is actually larger and hyper granulated. His buttock area on the left is just about closed however he has chronic inflammation with loss of the surface epithelial layer 3/28; 2-week follow-up. In clinic today with [name] new wound on the left anterior mid tibia. Says this happened about 2 weeks ago. He is not really sure how wonders about the spasticity of his legs at night whether that could have caused this other than that he does not have [name] good idea. He has been using topical antibiotics and silver alginate. The area on his right dorsal ankle seems somewhat better. ooFinally everything on his left  buttock is closed. 4/11; 2-week follow-up. All of his wounds are better except for the area over the ischium and left buttock which have opened up widely again. At least part of this is covered in necrotic fibrinous material another part had rolled nonviable skin. The area on the right ankle, left anterior mid tibia are both [name] lot better. He had no open wounds on either foot including the areas between the first and second toes 4/25; patient presents for 2-week follow-up. He states that the wounds are overall  stable. He has no complaints today and states he is using Hydrofera Blue to open wounds. 5/9; have not seen this man in over [name] month. For my memory he has open areas on the left mid tibia and right ankle. T oday he has new open area on the right dorsal foot which we have not had [name] problem with recently. He has the sustained area on the left buttock He is also changed his insurance at the beginning of the year [employer]. We will need prior authorizations for debridement 5/23; patient presents for 2-week follow-up. He has prior authorizations for debridement. He denies any issues in the past 2 weeks with his wound care. He has been using Hydrofera Blue to all the wounds. He does report [name] circular rash to the upper left leg that is new. He denies acute signs of infection. 6/6; 2-week follow-up. The patient has open wounds on the left buttock which are worse than the last time I saw this about [name] month ago. He also has [name] new area to me on the left anterior mid tibia with some surrounding erythema. The area on the dorsal ankle on the right is closed but I think this will be [name] friction injury every time this area is exposed to either our wraps or his compression stockings caused by unrelenting spasms in this leg. 6/20; 2-week follow-up. oo The patient has open wounds on the left buttock which is about the same. Using [facility] here. - The left mid tibia has [name] static amount of surrounding  erythema. Also [name] raised area in the center. We have been using Hydrofera Blue here. ooooFinally he has broken down in his dorsal right foot extending between the first and second toes and going to the base of the first and second toe webspace. I have previously assumed that this was severe venous hypertension 7/5; 2-week follow-up oo The left buttock wound actually looks better. We are using Hydrofera Blue. He has extensive skin irritation around this area and I have not really been able to get that any better. I have tried Lotrisone i.[name]. antifungals and steroids. More most recently we have just been using Coloplast really looks about the same. oo The left mid tibia which was new last week culture to have very resistant staph aureus. Not only methicillin-resistant but doxycycline resistant. The patient has [name] plethora of antibiotic allergies including sulfa, linezolid. I used topical bacitracin on this but he has not started this yet. oo In addition he has an expanding area of erythema with [name] wound on the dorsal right foot. I did [name] deep tissue culture of this area today 7/12; oo Left buttock area actually looks better surrounding skin also looks less irritated. oo Left mid tibia looks about the same. He is using bacitracin this is not worse oo Right dorsal foot looks about the same as well. oo The left first toe also looks about the same 7/19; left buttock wound continues to improve in terms of open areas oo Left mid tibia is still concerning amount of swelling he is using bacitracin oo Dorsal left first toe somewhat smaller oo Right dorsal foot somewhat smaller 7/25; left buttock wound actually continues to improve oo Left mid tibia area has less swelling. I gave him all my samples of new Nuzyra. This seems to have helped although the wound is still open it. His abrasion closed by here oo Left dorsal great toe really no better. Still [name] very nonviable surface oo Right dorsal  foot  perhaps some better. oo We have been using bacitracin and silver nitrate to the areas on his lower legs and Hydrofera Blue to the area on the buttock. 8/16 oo Disappointed that his left buttock wound is actually more substantial. Apparently during the last nurse visit these were both very small. He has continued irritation to [name] large area of skin on his buttock. I have never been able to totally explain this although I think it some combination of the way he sits, pressure, moisture. He is not incontinent enough to contribute to this. oo Left dorsal great toe still fibrinous debris on the surface that I have debrided today oo Large area across the dorsal right toes. oo The area on the left anterior mid tibia has less swelling. He completed the [place]. This does not look infected although the tissue is still fried 8/30; 2-week follow-up. oo Left buttock areas not improved. We used Hydrofera Blue on this. Weeping wet with the surrounding erythema that I have not been able to control even with Lotrisone and topical Coloplast oo Left dorsal great toe looks about the same oo More substantial area again at the base of his toes on the left which is new this week. oo Area across the dorsal right toes looks improved oo The left anterior mid tibia looks like it is trying to close 9/13; 2-week follow-up. Using silver alginate on all of his wounds. The left dorsal foot does not look any better. He has the area on the dorsal toe and also the areas at the base of all of his toes 1 through 3. On the right foot he has [name] similar pattern in [name] similar area. He has the area on his left mid tibia that looks fairly healthy. Finally the area on his left buttock looks somewhat bette 9/20; culture I did of the left foot which was [name] deep tissue culture last time showed [name]. coli he has erythema around this wound. Still [name] completely necrotic surface. His right dorsal foot looks about the same. He has [name] very friable  surface to the left anterior mid tibia. Both buttock wounds look better. We have been using silver alginate to all wounds Avetisyan, Thailan [name] ([id number]) [id number] Page 31 of 38 10/4; he has completed the cephalexin that I gave him last time for the left foot. He is using topical gentamicin under silver alginate silver alginate being applied to all the wounds. Unfortunately all the wounds look irritated on his dorsal right foot dorsal left foot left mid tibia. I wonder if this could be [name] silver allergy. I am going to change him to [facility] on the lower extremity. The skin on the left buttock and left posterior thigh still flaking dry and irritated. This has continued no matter what I have applied topically to this. He has [name] solitary open wound which by itself does not look too bad however the entire area of surrounding skin does not change no matter what we have applied here 10/18; the area on the left dorsal foot and right dorsal foot both look better. The area on the right extends into the plantar but not between his toes. We have been using silver alginate. He still has [name] rectangular erythematous area around the area on the left tibia. The wound itself is very small. Finally everything on his left buttock looks [name] little larger the skin is erythematous 11/15; patient comes in with the left dorsal and right dorsal foot distally looking somewhat better.  Still nonviable surface on the left foot which required debridement. He still has the area on the left anterior mid tibia although this looks somewhat better. He has [name] new area on the right lateral lower leg just above the ankle. Finally his left buttock looks terrible with multiple superficial open wounds geometric square shaped area of chronic erythema which I have not been able to sort through 11/29; right dorsal foot and left dorsal foot both look somewhat better. No debridement required. He has the fragile area on the  left anterior mid tibia this looks and continues to look somewhat better. Right lateral lower leg just above the ankle we identified last time also looks better. In general the area on his left buttock looks improved. We are using Hydrofera Blue to all wound areas 12/13; right dorsal foot looks better. The area on the right lateral leg is healed. Left dorsal foot has 2 open areas both of which require debridement. The fragile area on the left anterior mid tibial looks better. Smaller area on his buttocks. Were using Hydrofera Blue 1/10; patient comes in with everything looking slightly larger and/or worse. This includes his left buttock, reopening of the left mid tibia, larger areas on the left dorsal foot and what looks to be [name] cellulitis on the right dorsal and plantar foot. We have been using Hydrofera Blue on all wounds. 1/17; right dorsal foot distally looks better today. The left foot has 2 open wounds that are about the same surrounding erythema. 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 No malignancy they did not stain for fungus 1/24; his right distal foot is not closed dry and scaly but the wound looks like it is contracting. I did not debride anything here. Problem on his left dorsal foot with expanding erythema. Apparently there were problems last week getting the Riki Altes however it is now available at the [facility] [facility] but [name] week later. He is using ketoconazole and Coloplast to the left buttock along with Hydrofera Blue this actually looks quite [name] bit better today. 1/31; right dorsal foot again is dry and scaly but looking to contract. He has been using [name] moisturizer on his feet at my request but he is not sure which 1. The left dorsal foot wounds look about the same there is erythema here that I marked last week however after course of Nuzyra it  certainly is not any better but not any worse either. Finally on his left buttock the skin continues to look better he has the original wound but [name] new substantial area towards the gluteal cleft. Almost like [name] skin tear. I used scissors to remove skin and subcutaneous tissue here silver nitrate and direct pressure 2/7; right dorsal foot. This does not look too much different from last week. Some erythema skin dry and scaly. No debridement. Left dorsal great toe again still not much improvement. I did remove flaking dry skin and callus from around the edge. Finally on his left buttock. The skin is somewhat better in the periwound. Surface wounds are superficial somewhat better than last week. 01/26/2022: Is [name] little bit of [name] mystery as to why his wounds fail to respond to treatments and actually seem to get worse. This is my first encounter with this patient. He was previously followed by Dr. Leanord Hawking. Based upon my review of the chart, it seems that there is [name] little  bit of [name] mystery as to why his wounds do not respond as anticipated to the interventions applied and sometimes even get worse. Biopsies have been performed and he was seen by dermatology in [place], but that did not shed any light on the matter. T oday, his gluteal wound is larger, with substantial drainage, rather malodorous. The food wounds are not terrible, but he has [name] lot of callus and scaly skin around these. He is currently getting silver alginate on the gluteal wound, with idodoflex to the feet. He is using lotrisone on his legs for the dry, scaly skin. 02/09/2022: There has really been no change to any of his wounds. The gluteal wound less drainage and odor, but remains about the same size, the periwound skin remains oddly scaly. His lower extremity wounds also appear roughly the same size. They continue to accumulate [name] small amount of slough. The periwound on his feet and ankle wounds has dry eschar and loose dead skin. We have been  using silver alginate on the gluteal wound and Iodoflex on his feet and ankle wounds. T the periwound around his gluteal lesion and Lotrisone on his feet and legs. o 02/23/2022: The right plantar foot wound is closed. The gluteal site looks small but has continued to produce hypertrophic granulation tissue. The foot wounds all look about the same on the dorsal surface of the right foot; on the left, there is only [name] small open area at the site of where his left great toenail would have been. 03/16/2022: The right ankle wound is healed. The right dorsal foot wound is about the same. The left dorsal foot wound is quite [name] bit smaller and the ischial wound is nearly closed. 03/30/2022: The right ankle wound reopened. Both dorsal foot wounds are quite [name] bit smaller. Unfortunately, he appears to have sheared part of his ischial wound open further, perhaps during [name] transfer. 04/13/2022: The right ankle wound has hypertrophic granulation tissue present. The dorsal foot wounds continue to decrease in size. The ischial wound looks about the same today, no better, no worse. 04/27/2022: The right ankle wound has closed. Unfortunately, it looks like some moisture got underneath the dry skin on both of his dorsal feet and these wounds have expanded in size. The ischial wound remains the same with perhaps [name] little bit more slough accumulation than at our previous visit. 05/11/2022: The right ankle wound remains closed. There is [name] left anterior tibial wound that is small has patchy openings with accumulated slough. The dorsum of his right foot appears to be nearly healed with just [name] small punctate opening. The plantar surface of his right foot has [name] new opening that looks like he may have picked some skin there. His sacral ulcer has hypertrophic granulation tissue but has some slough accumulation. The dorsum of his left foot has multiple open areas in [name] fairly ragged distribution. All of these have slough accumulated within  them. 06/01/2022: The right ankle and left anterior tibial wound are both closed. Dorsum of his right foot and left foot both look substantially better with just tiny scattered openings The without any slough accumulation. He has sheared open new areas on his left gluteus and ischium. He says that his wheelchair cushion, which is air-filled, has [name] leak and so it keeps deflating. He is awaiting [name] new cushion. 06/15/2022: The right ankle wound has reopened and the fat layer is exposed. Both dorsal feet have just small openings with just [name] little bit of slough and  eschar accumulation. The wound on his left gluteus and ischium is larger again today and has [name] foul odor. 06/29/2022: The right ankle wound has hypertrophic granulation tissue buildup. His dorsal foot wounds both look better with just some eschar on the surface. He has [name] new wound on his left lateral ankle. He is not sure how he acquired it but by appearance, it looks that he hit it on something, potentially his wheelchair or bed. The ischial wound is about the same but is cleaner without any significant purulent drainage or odor. He did not understand what the [facility] call was about and therefore he does not have the topical compounded antibiotic. 07/13/2022: The right ankle wound again has hypertrophic granulation tissue, but less so than at his previous visit. The ischial wound has improved tremendously the use of the [facility] topical antibiotic. No significant change to the left lateral ankle wound; it is fibrotic with slough present. The skin of both of his feet, especially on the right, has [name] yeasty appearance. Ney, EMARION TORAL ([id number]) [id number] Page 32 of 38 08/10/2022: There is again hypertrophic granulation tissue on the right anterior ankle wound. Both feet are about the same. The left lateral ankle wound is [name] little bit desiccated and has some slough buildup. He unfortunately suffered [name] new injury when he  was removing his pants and they caught his bandage which caused [name] large skin tear on his left ischium, just distal to the existing wound. The existing left ischial wound, however, is significantly better with just [name] little light slough on the surface. 08/31/2022: The right anterior ankle wound is [name] lot smaller today underneath some eschar. No accumulation of hypertrophic granulation tissue. The left lateral ankle wound has some slough on the surface but is better in terms of moisture balance this week. The left dorsal foot does not really have any openings on it today. The right dorsal foot has some slough and eschar accumulation. His gluteal ulcer is basically closed aside from 2 small areas that are oozing [name] bit. 09/21/2022: The right anterior ankle wound is down to just [name] tiny pinhole. The left lateral ankle wound has accumulated slough again, but moisture balance is good. Left and right dorsal feet both have 2 small openings with some slough in them. The gluteal ulcer, unfortunately has opened up substantially. 10/05/2022: The right anterior ankle wound has reopened and has [name] fair amount of slough on the surface. The left lateral ankle wound has accumulated more slough, as well. He was approved for Apligraf, but the wound is not clean enough yet. There is some slough buildup on the dorsal right foot wound, as well as on his ischium. 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 10/26/2022: The right anterior ankle wound has closed again. The left lateral ankle wound looks quite [name] bit better. It is ready for Apligraf but we do not have 1 here for him. His ischium continues to get worse, with the wound larger and deeper. It really seems like this is [name] combination of pressure and friction. He has 2 new wounds, 1 on the plantar aspect of each foot. He says that he had some dry skin there that peeled away. Both are superficial. The dorsum of his  left foot does not have any new wound opening. The dorsum of his right foot has [name] bit of slough accumulation. 11/24/2022: The right anterior ankle wound has 2 small open  areas, both covered with eschar. The left lateral ankle wound has closed and considerably with just [name] little slough on the surface. The ischium has improved most significantly, having closed in most of the open area with just [name] few small remaining superficial openings. The dorsal foot wounds are small and superficial. The bottoms of his feet are closed. 12/07/2022: The right anterior ankle wound remains open with 2 small areas, again with slough and eschar present. The left lateral ankle wound is down to about half [name] centimeter with just some slough on the surface. His right dorsal foot has opened up more and has both slough and eschar present. The ischium has continued to improve and now just has 2 small open areas that are very clean. 12/21/2022: He has [name] new wound on his right lateral ankle. There is some slough present. The left lateral ankle is quite [name] bit smaller with some slough buildup. His left ischial ulcer is also nearly closed; there is just [name] tear in his skin that seems likely to be secondary to [name] transfer mishap. He has [name] new ulcer in his natal cleft that looks like it may be secondary to moisture. There is some slough in this area as well. The right anterior ankle is nearly closed. The right dorsal foot wound has some slough accumulation 01/04/2023: The anterior right ankle wound is closed, as is his ischial ulcer. The left lateral ankle wound is nearly closed with just [name] little bit of slough accumulation. The ulcer in his natal cleft is about the same size with slough buildup there, as well. He has [name] new wound on his left lateral leg near the knee from rubbing on his wheelchair. There is slough present, but no signs of infection. The right dorsal foot wound has [name] fairly heavy layer of slough buildup, as well. 01/18/2023: His  ischial ulcer has reopened and is huge. He has been using his old wheelchair cushion for reasons that are unclear. The ulcer in his natal cleft has healed. The wound on his left lateral leg near the knee is smaller with some slough present. The right dorsal foot wound has accumulated slough again. The right lateral ankle wound is healed. The left lateral ankle wound is smaller again this week. 02/01/2023: His ischial ulcer has had [name] ton of drainage over the last 2 weeks and the drainage has an absolutely putrid odor. He has gone back to using his new wheelchair chair cushion, though. The wound on his left lateral leg near his knee has healed. The left lateral ankle wound is nearly healed. Both the dorsal foot wounds are larger and have [name] lot of slough accumulation and drainage. The wound on his right anterior ankle is healed but there is [name] new 1 just proximal to this, also with slough and eschar present. Patient History Information obtained from Patient. Family History Diabetes - Father, Hypertension - Mother, No family history of Cancer, Heart Disease, Hereditary Spherocytosis, Kidney Disease, Lung Disease, Stroke, Thyroid Problems, Tuberculosis. Social History Former smoker, Marital Status - Married, Alcohol Use - Moderate, Drug Use - No History, Caffeine Use - Daily. Medical History Ear/Nose/Mouth/Throat Denies history of Chronic sinus problems/congestion, Middle ear problems Hematologic/Lymphatic Denies history of Anemia, Hemophilia, Human Immunodeficiency Virus, Lymphedema, Sickle Cell Disease Respiratory Patient has history of Sleep Apnea - does not tolerate cpap Denies history of Aspiration, Asthma, Chronic Obstructive Pulmonary Disease (COPD), Pneumothorax, Tuberculosis Cardiovascular Patient has history of Hypertension - lisinopril HCTZ Denies history of Angina, Arrhythmia, Congestive  Heart Failure, Coronary Artery Disease, Deep Vein Thrombosis, Hypotension, Myocardial  Infarction, Peripheral Arterial Disease, Peripheral Venous Disease, Phlebitis, Vasculitis Gastrointestinal Denies history of Cirrhosis , Colitis, Crohnoos, Hepatitis [name], Hepatitis B, Hepatitis C Endocrine Denies history of Type I Diabetes, Type II Diabetes Genitourinary Denies history of End Stage Renal Disease Immunological Denies history of Lupus Erythematosus, Raynaudoos, Scleroderma Integumentary (Skin) Denies history of History of Burn Musculoskeletal Denies history of Gout, Rheumatoid Arthritis, Osteoarthritis, Osteomyelitis Neurologic Patient has history of Paraplegia Denies history of Dementia, Neuropathy, Quadriplegia, Seizure Disorder Oncologic Denies history of Received Chemotherapy, Received Radiation Psychiatric Denies history of Anorexia/bulimia, Confinement Anxiety Hospitalization/Surgery History - cellulitis in leg. - left leg vein ablation. Kley, OZZIE KNOBEL ([id number]) [id number] Page 33 of 38 Objective Constitutional Tachycardic, asymptomatic. no acute distress. Vitals Time Taken: 8:25 AM, Height: 70 in, Weight: 216 lbs, BMI: 31, Temperature: 97.9 F, Pulse: 114 bpm, Respiratory Rate: 18 breaths/min, Blood Pressure: 122/79 mmHg. Respiratory Normal work of breathing on room air. General Notes: 02/01/2023: His ischial ulcer has had [name] ton of drainage over the last 2 weeks and the drainage has an absolutely putrid odor. He has gone back to using his new wheelchair chair cushion, though. The wound on his left lateral leg near his knee has healed. The left lateral ankle wound is nearly healed. Both the dorsal foot wounds are larger and have [name] lot of slough accumulation and drainage. The wound on his right anterior ankle is healed but there is [name] new 1 just proximal to this, also with slough and eschar present. Integumentary (Hair, Skin) Wound #52 status is Open. Original cause of wound was Gradually Appeared. The date acquired was: 03/27/2021.  The wound has been in treatment 96 weeks. The wound is located on the Right,Dorsal Foot. The wound measures 1.4cm length x 4.2cm width x 0.1cm depth; 4.618cm^2 area and 0.462cm^3 volume. There is Fat Layer (Subcutaneous Tissue) exposed. There is [name] medium amount of serosanguineous drainage noted. The wound margin is flat and intact. There is small (1-33%) red granulation within the wound bed. There is [name] large (67-100%) amount of necrotic tissue within the wound bed including Adherent Slough. The periwound skin appearance had no abnormalities noted for texture. The periwound skin appearance exhibited: Dry/Scaly, Erythema. The surrounding wound skin color is noted with erythema. Periwound temperature was noted as No Abnormality. Wound #56 status is Open. Original cause of wound was Gradually Appeared. The date acquired was: 07/11/2021. The wound has been in treatment 80 weeks. The wound is located on the Left,Dorsal Foot. The wound measures 3.4cm length x 6cm width x 0.1cm depth; 16.022cm^2 area and 1.602cm^3 volume. There is Fat Layer (Subcutaneous Tissue) exposed. There is [name] small amount of serous drainage noted. The wound margin is flat and intact. There is small (1-33%) red granulation within the wound bed. There is [name] small (1-33%) amount of necrotic tissue within the wound bed including Adherent Slough. The periwound skin appearance had no abnormalities noted for texture. The periwound skin appearance exhibited: Dry/Scaly. The periwound skin appearance did not exhibit: Maceration, Erythema. Periwound temperature was noted as No Abnormality. Wound #67 status is Open. Original cause of wound was Gradually Appeared. The date acquired was: 06/22/2022. The wound has been in treatment 31 weeks. The wound is located on the Left,Lateral Lower Leg. The wound measures 1.2cm length x 0.8cm width x 0.1cm depth; 0.754cm^2 area and 0.075cm^3 volume. There is Fat Layer (Subcutaneous Tissue) exposed. There is [name] medium  amount of serosanguineous drainage noted. The wound  margin is distinct with the outline attached to the wound base. There is large (67-100%) red granulation within the wound bed. There is [name] small (1-33%) amount of necrotic tissue within the wound bed including Adherent Slough. The periwound skin appearance had no abnormalities noted for texture. The periwound skin appearance exhibited: Dry/Scaly, Erythema. The surrounding wound skin color is noted with erythema with red streaks. Periwound temperature was noted as No Abnormality. Wound #73 status is Healed - Epithelialized. Original cause of wound was Pressure Injury. The date acquired was: 12/21/2022. The wound has been in treatment 4 weeks. The wound is located on the Left,Proximal,Lateral Lower Leg. The wound measures 0cm length x 0cm width x 0cm depth; 0cm^2 area and 0cm^3 volume. There is no tunneling or undermining noted. There is [name] small amount of serosanguineous drainage noted. There is no granulation within the wound bed. There is no necrotic tissue within the wound bed. The periwound skin appearance had no abnormalities noted for texture. The periwound skin appearance had no abnormalities noted for moisture. The periwound skin appearance did not exhibit: Erythema. Periwound temperature was noted as No Abnormality. Wound #74 status is Open. Original cause of wound was Pressure Injury. The date acquired was: 01/10/2023. The wound has been in treatment 2 weeks. The wound is located on the Left,Posterior Upper Leg. The wound measures 9.7cm length x 6.8cm width x 0.1cm depth; 51.805cm^2 area and 5.18cm^3 volume. There is Fat Layer (Subcutaneous Tissue) exposed. There is no tunneling or undermining noted. There is [name] large amount of serosanguineous drainage noted. The wound margin is flat and intact. There is medium (34-66%) red granulation within the wound bed. There is [name] medium (34-66%) amount of necrotic tissue within the wound bed including Adherent  Slough. The periwound skin appearance had no abnormalities noted for moisture. The periwound skin appearance had no abnormalities noted for color. The periwound skin appearance exhibited: Scarring. Periwound temperature was noted as No Abnormality. Wound #75 status is Open. Original cause of wound was Gradually Appeared. The date acquired was: 02/01/2023. The wound is located on the Right,Distal,Anterior Lower Leg. The wound measures 0.6cm length x 0.8cm width x 0.1cm depth; 0.377cm^2 area and 0.038cm^3 volume. There is Fat Layer (Subcutaneous Tissue) exposed. There is [name] small amount of serosanguineous drainage noted. The wound margin is distinct with the outline attached to the wound base. There is small (1-33%) red granulation within the wound bed. There is no necrotic tissue within the wound bed. The periwound skin appearance had no abnormalities noted for texture. The periwound skin appearance had no abnormalities noted for color. The periwound skin appearance exhibited: Dry/Scaly. Periwound temperature was noted as No Abnormality. 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 There was [name] Excisional Skin/Subcutaneous Tissue Debridement with  [name] total area of 5.88 sq cm performed by Duanne Guess, MD. With the following  instrument(s): Curette to remove Viable and Non-Viable tissue/material. Material removed includes Subcutaneous Tissue and Slough and. No specimens were taken. [name] time out was conducted at 09:05, prior to the start of the procedure. [name] Minimum amount of bleeding was controlled with Pressure. The procedure was tolerated well with [name] pain level of Insensate throughout and [name] pain level of Insensate following the procedure. Post Debridement Measurements: 1.4cm length x 4.2cm width x 0.1cm depth; 0.462cm^3 volume. Character of Wound/Ulcer Post Debridement is improved. Severity of Tissue Post Debridement is: Fat layer exposed. Post procedure Diagnosis Wound #52: Same as Pre-Procedure General Notes: Scribed for Dr. Lady Gary by Zenaida Deed, RN. 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 There was [name] Excisional Skin/Subcutaneous Tissue Debridement with [name] total area of 20.4 sq cm performed by Duanne Guess, MD. With the following instrument(s): Curette to remove Viable and Non-Viable tissue/material. Material removed includes Subcutaneous Tissue, Slough, and Skin: Epidermis. No specimens were taken. [name] time out was conducted at 09:05, prior to the start of the procedure. [name] Minimum amount of bleeding was controlled with Pressure. The procedure was tolerated well with [name] pain level of Insensate throughout and [name] pain level of Insensate following the procedure. Post Debridement Measurements: 3.4cm length x 6cm width x 0.1cm depth; 1.602cm^3 volume. Character of Wound/Ulcer Post Debridement is improved. Post procedure Diagnosis Wound #56: Same as Pre-Procedure General Notes: Scribed for Dr. Lady Gary by Zenaida Deed, RN. Wound #67 Pre-procedure diagnosis of Wound #67 is [name] Venous Leg Ulcer located on the Left,Lateral Lower Leg .Severity of Tissue Pre Debridement  is: Fat layer exposed. There was [name] Excisional Skin/Subcutaneous Tissue Debridement with [name] total area of 0.96 sq cm performed by Duanne Guess, MD. With the following instrument(s): Curette to remove Viable and Non-Viable tissue/material. Material removed includes Subcutaneous Tissue, Slough, and Skin: Epidermis. No specimens were taken. [name] time out was conducted at 09:05, prior to the start of the procedure. [name] Minimum amount of bleeding was controlled with Pressure. The procedure was tolerated well with [name] pain level of Insensate throughout and [name] pain level of Insensate following the procedure. Post Debridement Measurements: 1.2cm length x 0.8cm width x 0.1cm depth; 0.075cm^3 volume. Character of Wound/Ulcer Post Debridement is improved. Severity of Tissue Post Debridement is: Fat layer exposed. Post procedure Diagnosis Wound #67: Same as Pre-Procedure General Notes: Scribed for Dr. Lady Gary by Zenaida Deed, RN. Wound #74 Pre-procedure diagnosis of Wound #74 is [name] Pressure Ulcer located on the Left,Posterior Upper Leg . There was [name] Excisional Skin/Subcutaneous Tissue Debridement with [name] total area of 48 sq cm performed by Duanne Guess, MD. With the following instrument(s): Curette to remove Viable and Non-Viable tissue/material. Material removed includes Subcutaneous Tissue and Slough and. No specimens were taken. [name] time out was conducted at 09:05, prior to the start of the procedure. [name] Minimum amount of bleeding was controlled with Pressure. The procedure was tolerated well with [name] pain level of Insensate throughout and [name] pain level of Insensate following the procedure. Post Debridement Measurements: 9.7cm length x 6.8cm width x 0.1cm depth; 5.18cm^3 volume. Post debridement Stage noted as Category/Stage III. Character of Wound/Ulcer Post Debridement is improved. Post procedure Diagnosis Wound #74: Same as Pre-Procedure General Notes: Scribed for Dr. Lady Gary by Zenaida Deed, RN. Wound  #75 Pre-procedure diagnosis of Wound #75 is [name] Venous Leg Ulcer located on the Right,Distal,Anterior Lower Leg .Severity of Tissue Pre Debridement is: Fat layer exposed. There was [name] Selective/Open  Wound Non-Viable Tissue Debridement with [name] total area of 0.48 sq cm performed by Duanne Guess, MD. With the following instrument(s): Curette to remove Non-Viable tissue/material. Material removed includes Eschar and Slough and. No specimens were taken. [name] time out was conducted at 09:05, prior to the start of the procedure. [name] Minimum amount of bleeding was controlled with Pressure. The procedure was tolerated well with [name] pain level of Insensate throughout and [name] pain level of Insensate following the procedure. Post Debridement Measurements: 0.6cm length x 0.8cm width x 0.1cm depth; 0.038cm^3 volume. Character of Wound/Ulcer Post Debridement is improved. Severity of Tissue Post Debridement is: Fat layer exposed. Post procedure Diagnosis Wound #75: Same as Pre-Procedure General Notes: Scribed for Dr. Lady Gary by Zenaida Deed, RN. Plan Follow-up Appointments: Return Appointment in 2 weeks. - Dr. Lady Gary RM 1 Tuesday 3/26 @ 0815 am Bathing/ Shower/ Hygiene: May shower and wash wound with soap and water. - on days that dressing is changed Edema Control - Lymphedema / SCD / Other: Moisturize legs daily. - using Aquaphor generously to both legs and feet with dressing changes Compression stocking or Garment 30-40 mm/Hg pressure to: - Juxtalite to both legs daily Off-Loading: Roho cushion for wheelchair - use newer cushion Turn and reposition every 2 hours - lift up with arms in wheelchair every hour during the day Laboratory ordered were: Anaerobic culture - left posterior upper leg WOUND #52: - Foot Wound Laterality: Dorsal, Right Cleanser: Soap and Water Every Other Day/30 Days Discharge Instructions: May shower and wash wound with dial antibacterial soap and water prior to dressing change. Cleanser:  Wound Cleanser Every Other Day/30 Days Discharge Instructions: Cleanse the wound with wound cleanser prior to applying [name] clean dressing using gauze sponges, not tissue or cotton balls. Peri-Wound Care: Sween Lotion (Moisturizing lotion) Every Other Day/30 Days Discharge Instructions: Apply Aquaphor moisturizing lotion as directed Prim Dressing: Maxorb Extra CMC/Alginate Dressing, 4x4 (in/in) Every Other Day/30 Days ary Discharge Instructions: Apply to wound bed as instructed Secondary Dressing: Woven Gauze Sponge, Non-Sterile 4x4 in Every Other Day/30 Days Discharge Instructions: Apply over primary dressing as directed. Secured With: [employer], 4.5x3.1 (in/yd) (Generic) Every Other Day/30 Days Bibian, Kasean [name] ([id number]) [id number] Page 35 of 38 Discharge Instructions: Secure with Kerlix as directed. Secured With: 539M Medipore H Soft Cloth Surgical T ape, 4 x 10 (in/yd) (Generic) Every Other Day/30 Days Discharge Instructions: Secure with tape as directed. Com pression Stockings: Circaid Juxta Lite Compression Wrap Compression Amount: 30-40 mmHg (right) Discharge Instructions: Apply Circaid Juxta Lite Compression Wrap daily as instructed. Apply first thing in the morning, remove at night before bed. WOUND #56: - Foot Wound Laterality: Dorsal, Left Cleanser: Soap and Water Every Other Day/30 Days Discharge Instructions: May shower and wash wound with dial antibacterial soap and water prior to dressing change. Cleanser: Wound Cleanser Every Other Day/30 Days Discharge Instructions: Cleanse the wound with wound cleanser prior to applying [name] clean dressing using gauze sponges, not tissue or cotton balls. Peri-Wound Care: Sween Lotion (Moisturizing lotion) Every Other Day/30 Days Discharge Instructions: Apply Aquaphor moisturizing lotion as directed Prim Dressing: Maxorb Extra CMC/Alginate Dressing, 4x4 (in/in) Every Other Day/30 Days ary Discharge  Instructions: Apply to wound bed as instructed Secondary Dressing: Woven Gauze Sponge, Non-Sterile 4x4 in Every Other Day/30 Days Discharge Instructions: Apply over primary dressing as directed. Secured With: [employer], 4.5x3.1 (in/yd) (Generic) Every Other Day/30 Days Discharge Instructions: Secure with Kerlix as directed. Secured With: 539M Medipore Financial risk analyst Surgical T  ape, 4 x 10 (in/yd) (Generic) Every Other Day/30 Days Discharge Instructions: Secure with tape as directed. WOUND #67: - Lower Leg Wound Laterality: Left, Lateral Cleanser: Soap and Water Every Other Day/30 Days Discharge Instructions: May shower and wash wound with dial antibacterial soap and water prior to dressing change. Cleanser: Wound Cleanser Every Other Day/30 Days Discharge Instructions: Cleanse the wound with wound cleanser prior to applying [name] clean dressing using gauze sponges, not tissue or cotton balls. Peri-Wound Care: Sween Lotion (Moisturizing lotion) Every Other Day/30 Days Discharge Instructions: Apply Aquaphor moisturizing lotion as directed Prim Dressing: Maxorb Extra CMC/Alginate Dressing, 4x4 (in/in) Every Other Day/30 Days ary Discharge Instructions: Apply to wound bed as instructed Secondary Dressing: Woven Gauze Sponge, Non-Sterile 4x4 in Every Other Day/30 Days Discharge Instructions: Apply over primary dressing as directed. Secured With: [employer], 4.5x3.1 (in/yd) (Generic) Every Other Day/30 Days Discharge Instructions: Secure with Kerlix as directed. Secured With: 31M Medipore H Soft Cloth Surgical T ape, 4 x 10 (in/yd) (Generic) Every Other Day/30 Days Discharge Instructions: Secure with tape as directed. WOUND #74: - Upper Leg Wound Laterality: Left, Posterior Cleanser: Vashe 5.8 (oz) 1 x Per Day/30 Days Discharge Instructions: Cleanse the wound with Vashe prior to applying [name] clean dressing using gauze sponges , let sit on wound for 10 minutes Peri-Wound Care: Zinc Oxide  Ointment 30g tube 1 x Per Day/30 Days Discharge Instructions: Apply Zinc Oxide to periwound with each dressing change as needed fpr moisture Prim Dressing: Maxorb Extra CMC/Alginate Dressing, 4x4 (in/in) 1 x Per Day/30 Days ary Discharge Instructions: Apply to wound bed as instructed Secondary Dressing: Zetuvit Plus Silicone Border Sacrum Dressing, Sm, 7x7 (in/in) 1 x Per Day/30 Days Discharge Instructions: Apply silicone border over primary dressing as directed. Secured With: 31M Medipore H Soft Cloth Surgical T ape, 4 x 10 (in/yd) 1 x Per Day/30 Days Discharge Instructions: Secure with tape as directed. WOUND #75: - Lower Leg Wound Laterality: Right, Anterior, Distal Cleanser: Soap and Water Every Other Day/30 Days Discharge Instructions: May shower and wash wound with dial antibacterial soap and water prior to dressing change. Cleanser: Wound Cleanser Every Other Day/30 Days Discharge Instructions: Cleanse the wound with wound cleanser prior to applying [name] clean dressing using gauze sponges, not tissue or cotton balls. Peri-Wound Care: Sween Lotion (Moisturizing lotion) Every Other Day/30 Days Discharge Instructions: Apply Aquaphor moisturizing lotion as directed Prim Dressing: Maxorb Extra CMC/Alginate Dressing, 4x4 (in/in) Every Other Day/30 Days ary Discharge Instructions: Apply to wound bed as instructed Secondary Dressing: Woven Gauze Sponge, Non-Sterile 4x4 in Every Other Day/30 Days Discharge Instructions: Apply over primary dressing as directed. Secured With: [employer], 4.5x3.1 (in/yd) (Generic) Every Other Day/30 Days Discharge Instructions: Secure with Kerlix as directed. Secured With: 31M Medipore H Soft Cloth Surgical T ape, 4 x 10 (in/yd) (Generic) Every Other Day/30 Days Discharge Instructions: Secure with tape as directed. 02/01/2023: His ischial ulcer has had [name] ton of drainage over the last 2 weeks and the drainage has an absolutely putrid odor. He has gone back to  using his new wheelchair chair cushion, though. The wound on his left lateral leg near his knee has healed. The left lateral ankle wound is nearly healed. Both the dorsal foot wounds are larger and have [name] lot of slough accumulation and drainage. The wound on his right anterior ankle is healed but there is [name] new 1 just proximal to this, also with slough and eschar present. I used [name] curette to debride slough  from the entire surface of his ischial ulcer. I then took [name] culture. I debrided slough and subcutaneous tissue from his left lateral ankle, both dorsal foot wounds, and his new right anterior ankle wound. We will use silver alginate to all sites. I asked him to use his old Jodie Echevaria topical antibiotic compound to his ischial ulcer until I get his culture results back. He may require [name] new formulation. We are also going to have him soak the ischial ulcer in Vashe cleanser 10 minutes [name] day to try and reduce the microbial load and odor. He will follow-up in 2 weeks. Electronic Signature(s) Signed: 02/01/2023 10:16:35 AM By: Duanne Guess MD FACS Entered By: Duanne Guess on 02/01/2023 10:16:35 HxROS Details -------------------------------------------------------------------------------- Cedric Fishman ([id number]) [id number] Page 36 of 38 Patient Name: Date of Service: [place], [name] 02/01/2023 8:15 [name] Medical Record Number: [id number] Patient Account Number: [id number] Date of Birth/Sex: Treating RN: [birth year] (37 y.o. M) Primary Care Provider: [name], [name] Other Clinician: Referring Provider: Treating Provider/Extender: Fredderick Severance [name]: 369 Information Obtained From Patient Ear/Nose/Mouth/Throat Medical History: Negative for: Chronic sinus problems/congestion; Middle ear problems Hematologic/Lymphatic Medical History: Negative for: Anemia; Hemophilia; Human Immunodeficiency Virus; Lymphedema; Sickle Cell  Disease Respiratory Medical History: Positive for: Sleep Apnea - does not tolerate cpap Negative for: Aspiration; Asthma; Chronic Obstructive Pulmonary Disease (COPD); Pneumothorax; Tuberculosis Cardiovascular Medical History: Positive for: Hypertension - lisinopril HCTZ Negative for: Angina; Arrhythmia; Congestive Heart Failure; Coronary Artery Disease; Deep Vein Thrombosis; Hypotension; Myocardial Infarction; Peripheral Arterial Disease; Peripheral Venous Disease; Phlebitis; Vasculitis Gastrointestinal Medical History: Negative for: Cirrhosis ; Colitis; Crohns; Hepatitis [name]; Hepatitis B; Hepatitis C Endocrine Medical History: Negative for: Type I Diabetes; Type II Diabetes Genitourinary Medical History: Negative for: End Stage Renal Disease Immunological Medical History: Negative for: Lupus Erythematosus; Raynauds; Scleroderma Integumentary (Skin) Medical History: Negative for: History of Burn Musculoskeletal Medical History: Negative for: Gout; Rheumatoid Arthritis; Osteoarthritis; Osteomyelitis Neurologic Medical History: Positive for: Paraplegia Negative for: Dementia; Neuropathy; Quadriplegia; Seizure Disorder Oncologic Medical History: Negative for: Received Chemotherapy; Received Radiation Psychiatric Medical History: Negative for: Anorexia/bulimia; Confinement Anxiety Cato, Elias [name] ([id number]) [id number] Page 37 of 38 Immunizations Pneumococcal Vaccine: Received Pneumococcal Vaccination: No Immunization Notes: doesn't remember when last tetanus shot was Implantable Devices No devices added Hospitalization / Surgery History Type of Hospitalization/Surgery cellulitis in leg left leg vein ablation Family and Social History Cancer: No; Diabetes: Yes - Father; Heart Disease: No; Hereditary Spherocytosis: No; Hypertension: Yes - Mother; Kidney Disease: No; Lung Disease: No; Stroke: No; Thyroid Problems: No; Tuberculosis: No; Former  smoker; Marital Status - Married; Alcohol Use: Moderate; Drug Use: No History; Caffeine Use: Daily; Financial Concerns: No; Food, Clothing or Shelter Needs: No; Support System Lacking: No; Transportation Concerns: No Electronic Signature(s) Signed: 02/01/2023 1:18:05 PM By: Duanne Guess MD FACS Entered By: Duanne Guess on 02/01/2023 10:14:07 -------------------------------------------------------------------------------- SuperBill Details Patient Name: Date of Service: [place], [name] 02/01/2023 Medical Record Number: [id number] Patient Account Number: [id number] Date of Birth/Sex: Treating RN: Mar 04, 1986 (36 y.o. M) Primary Care Provider: [name], [name] Other Clinician: Referring Provider: Treating Provider/Extender: Benjamine Mola, [name]: 369 Diagnosis Coding ICD-10 Codes Code Description I87.332 Chronic venous hypertension (idiopathic) with ulcer and inflammation of left lower extremity L97.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, 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 with other 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, complete L97.312 Non-pressure  chronic ulcer of right ankle with fat layer exposed L97.322 Non-pressure chronic ulcer of left ankle with fat layer exposed L98.492 Non-pressure chronic ulcer of skin of other sites with fat layer exposed Facility Procedures : CPT4 Code: [id number] Description: 11042 - DEB SUBQ TISSUE 20 SQ CM/< ICD-10 Diagnosis Description 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 L97.518 Non-pressure chronic  ulcer of other part of right foot with other specified sever L98.492 Non-pressure chronic ulcer of skin of other sites with fat layer exposed Modifier: ity Quantity: 1 : CPT4 Code:  [id number] Description: 11045 - DEB SUBQ TISS EA ADDL 20CM ICD-10 Diagnosis Description L98.492 Non-pressure chronic ulcer of skin of other sites with fat layer exposed L97.312 Non-pressure chronic ulcer of right ankle with fat layer exposed L97.322 Non-pressure  chronic ulcer of left ankle with fat layer exposed L97.518 Non-pressure chronic ulcer of other part of right foot with other specified sever Modifier: ity Quantity: 3 Physician Procedures : CPT4 Code Description Modifier [id number] - WC PHYS LEVEL 4 - EST PT 25 ICD-10 Diagnosis Description 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  L89.323 Pressure ulcer of left buttock, 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 with other specified severity Quantity: 1 : [id number] 11042 - WC PHYS SUBQ TISS 20 SQ CM ICD-10 Diagnosis Description 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 L97.518 Non-pressure chronic ulcer  of other part of right foot with other specified severity L98.492 Non-pressure chronic ulcer of skin of other sites with fat layer exposed Quantity: 1 : [id number] 11045 - WC PHYS SUBQ TISS EA ADDL 20 CM ICD-10 Diagnosis Description L98.492 Non-pressure chronic ulcer of skin of other sites with fat layer exposed L97.312 Non-pressure chronic ulcer of right ankle with fat layer exposed L97.322 Non-pressure  chronic ulcer of left ankle with fat layer exposed L97.518 Non-pressure chronic ulcer of other part of right foot with other specified severity Quantity: 3 : [id number] - WC PHYS DEBR WO ANESTH 20 SQ CM ICD-10 Diagnosis Description L89.323 Pressure ulcer of left buttock, stage 3 Quantity: 1 Electronic Signature(s) Signed: 02/01/2023 10:18:34 AM By: Duanne Guess MD FACS Entered By: Duanne Guess on  02/01/2023 10:18:34

## 2023-03-22 ENCOUNTER — Encounter (HOSPITAL_BASED_OUTPATIENT_CLINIC_OR_DEPARTMENT_OTHER): Payer: BC Managed Care – PPO | Admitting: General Surgery

## 2023-03-22 DIAGNOSIS — L97522 Non-pressure chronic ulcer of other part of left foot with fat layer exposed: Secondary | ICD-10-CM | POA: Diagnosis not present

## 2023-03-22 DIAGNOSIS — I87333 Chronic venous hypertension (idiopathic) with ulcer and inflammation of bilateral lower extremity: Secondary | ICD-10-CM | POA: Diagnosis not present

## 2023-03-22 DIAGNOSIS — I872 Venous insufficiency (chronic) (peripheral): Secondary | ICD-10-CM | POA: Diagnosis not present

## 2023-03-22 DIAGNOSIS — L97822 Non-pressure chronic ulcer of other part of left lower leg with fat layer exposed: Secondary | ICD-10-CM | POA: Diagnosis not present

## 2023-03-22 DIAGNOSIS — G8221 Paraplegia, complete: Secondary | ICD-10-CM | POA: Diagnosis not present

## 2023-03-22 DIAGNOSIS — L97322 Non-pressure chronic ulcer of left ankle with fat layer exposed: Secondary | ICD-10-CM | POA: Diagnosis not present

## 2023-03-22 DIAGNOSIS — L97512 Non-pressure chronic ulcer of other part of right foot with fat layer exposed: Secondary | ICD-10-CM | POA: Diagnosis not present

## 2023-03-22 DIAGNOSIS — L89323 Pressure ulcer of left buttock, stage 3: Secondary | ICD-10-CM | POA: Diagnosis not present

## 2023-03-22 DIAGNOSIS — L97511 Non-pressure chronic ulcer of other part of right foot limited to breakdown of skin: Secondary | ICD-10-CM | POA: Diagnosis not present

## 2023-03-22 DIAGNOSIS — L97518 Non-pressure chronic ulcer of other part of right foot with other specified severity: Secondary | ICD-10-CM | POA: Diagnosis not present

## 2023-03-22 DIAGNOSIS — L89893 Pressure ulcer of other site, stage 3: Secondary | ICD-10-CM | POA: Diagnosis not present

## 2023-03-23 NOTE — Progress Notes (Signed)
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 surface buttons certain parts of this long thin wound it appears like there has been improvement in dimensions. 01/20/17; the erythema still present but much better in the left leg. I'm thinking this is his usual degree of chronic venous inflammation. The wound on the left leg looks somewhat better. Is less surface slough 01/27/17; erythema is back to the chronic venous inflammation. The wound on the left leg is somewhat better. I am back to the point where I like to try an Apligraf once again 02/10/17; slight improvement in wound dimensions. Apligraf #2. He is completing his doxycycline 02/14/17; patient arrives today having completed doxycycline last Thursday. This was supposed to be a nurse visit however once again he hasn't tense erythema from the medial part of his wound extending over the lower leg. Also erythema in his foot this is roughly in the same distribution as last time. He has baseline chronic venous inflammation however this is a lot worse than the baseline I have learned to accept the on him  is baseline inflammation 02/24/17- patient is here for follow-up evaluation. He is tolerating compression therapy. His voicing no complaints or concerns he is here anticipating an Apligraf 03/03/17; he arrives today with an adherent necrotic surface. I don't think this is surface is going to be amenable for Apligraf's. The erythema around his wound and on the left dorsal foot has resolved he is off antibiotics 03/10/17; better-looking surface today. I don't think he can tolerate Apligraf's. He tells me he had a wound VAC after a skin graft years ago to this area and they had difficulty with a seal. The erythema continues to be stable around this some degree of chronic venous inflammation but he also has recurrent cellulitis. We have been using Iodoflex 03/17/17; continued improvement in the surface and may be small changes in dimensions. Using Iodoflex which seems the only thing that will control his surface 03/24/17- He is here for follow up evaluation of his LLE lateral ulceration and ulcer to right dorsal foot/toe space. He is voicing no complaints or concerns, He is tolerating compression wrap. 03/31/17 arrives today with a much healthier looking wound on the left lower extremity. We have been using Iodoflex for a prolonged period of time which has for the first time prepared and adequate looking  wound bed although we have not had much in the way of wound dimension improvement. He also has a small wound between the first and second toe on the right 04/07/17; arrives today with a healthy-looking wound bed and at least the top 50% of this wound appears to be now her. No debridement was required I have changed him to [facility] last week after prolonged Iodoflex. He did not do well with Apligraf's. We've had a re-opening between the first and second toe on the right 04/14/17; arrives today with a healthier looking wound bed contractions and the top 50% of this wound and some on the lesser 50%. Wound bed  appears healthy. The area between the first and second toe on the right still remains problematic 04/21/17; continued very gradual improvement. Using [facility] 04/28/17; continued very gradual improvement in the left lateral leg venous insufficiency wound. His periwound erythema is very mild. We have been using Hydrofera Blue. Wound is making progress especially in the superior 50% 05/05/17; he continues to have very gradual improvement in the left lateral venous insufficiency wound. Both in terms with an length rings are improving. I Petrosino, Gryphon E ([id number]) [id number] Page 6 of 37 debrided this every 2 weeks with #5 curet and we have been using Hydrofera Blue and again making good progress With regards to the wounds between his right first and second toe which I thought might of been tinea pedis he is not making as much progress very dry scaly skin over the area. Also the area at the base of the left first and second toe in a similar condition 05/12/17; continued gradual improvement in the refractory left lateral venous insufficiency wound on the left. Dimension smaller. Surface still requiring debridement using Hydrofera Blue 05/19/17; continued gradual improvement in the refractory left lateral venous ulceration. Careful inspection of the wound bed underlying rumination suggested some degree of epithelialization over the surface no debridement indicated. Continue Hydrofera Blue difficult areas between his toes first and third on the left than first and second on the right. I'm going to change to silver alginate from silver collagen. Continue ketoconazole as I suspect underlying tinea pedis 05/26/17; left lateral leg venous insufficiency wound. We've been using Hydrofera Blue. I believe that there is expanding epithelialization over the surface of the wound albeit not coming from the wound circumference. This is a bit of an odd situation in which the epithelialization  seems to be coming from the surface of the wound rather than in the exact circumference. There is still small open areas mostly along the lateral margin of the wound. He has unchanged areas between the left first and second and the right first second toes which I been treating for tenia pedis 06/02/17; left lateral leg venous insufficiency wound. We have been using Hydrofera Blue. Somewhat smaller from the wound circumference. The surface of the wound remains a bit on it almost epithelialized sedation in appearance. I use an open curette today debridement in the surface of all of this especially the edges Small open wounds remaining on the dorsal right first and second toe interspace and the plantar left first second toe and her face on the left 06/09/17; wound on the left lateral leg continues to be smaller but very gradual and very dry surface using Hydrofera Blue 06/16/17 requires weekly debridements now on the left lateral leg although this continues to contract. I changed to silver collagen last week because of dryness of the wound bed. Using Iodoflex to the areas  on his first and second toes/web space bilaterally 06/24/17; patient with history of paraplegia also chronic venous insufficiency with lymphedema. Has a very difficult wound on the left lateral leg. This has been gradually reducing in terms of with but comes in with a very dry adherent surface. High switch to silver collagen a week or so ago with hydrogel to keep the area moist. This is been refractory to multiple dressing attempts. He also has areas in his first and second toes bilaterally in the anterior and posterior web space. I had been using Iodoflex here after a prolonged course of silver alginate with ketoconazole was ineffective [question tinea pedis] 07/14/17; patient arrives today with a very difficult adherent material over his left lateral lower leg wound. He also has surrounding erythema and poorly controlled edema. He was  switched his Santyl last visit which the nurses are applying once during his doctor visit and once on a nurse visit. He was also reduced to 2 layer compression I'm not exactly sure of the issue here. 07/21/17; better surface today after 1 week of Iodoflex. Significant cellulitis that we treated last week also better. [Doxycycline] 07/28/17 better surface today with now 2 weeks of Iodoflex. Significant cellulitis treated with doxycycline. He has now completed the doxycycline and he is back to his usual degree of chronic venous inflammation/stasis dermatitis. He reminds me he has had ablations surgery here 08/04/17; continued improvement with Iodoflex to the left lateral leg wound in terms of the surface of the wound although the dimensions are better. 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 Problematic areas on the plantar aspect of the first second toe web space on the left and the dorsal aspect of the first second toe web space on the right. At one point I felt these were probably related to chronic fungal infections in treated him aggressively for this although we have not made any improvement here. 08/11/17; left lateral leg. Surface continues to improve with the Iodoflex although we are not seeing much improvement in overall wound dimensions. Areas on his plantar left foot and right foot show no improvement. In fact the right foot looks somewhat worse 08/18/17; left lateral leg. We changed to [facility] Blue last week after a prolonged course of Iodoflex which helps get the surface better. It appears that the wound with is improved. Continue with difficult areas on the left dorsal first second and plantar first second on the right 09/01/17; patient arrives in clinic today having had a temperature of 103 yesterday. He was seen in the ER and [facility]. The patient was concerned he could have cellulitis again in the right leg however  they diagnosed him with a UTI and he is now on Keflex. He has a history of cellulitis which is been recurrent and difficult but this is been in the left leg, in the past 5 use doxycycline. He does in and out catheterizations at home which are risk factors for UTI 09/08/17; patient will be completing his Keflex this weekend. The erythema on the left leg is considerably better. He has a new wound today on the medial part of the right leg small superficial almost looks like a skin tear. He has worsening of the area on the right dorsal first and second toe. His major area on the left lateral leg is better. Using Hydrofera Blue on all areas 09/15/17; gradual reduction in width on the long wound in the left lateral leg. No  debridement required. He also has wounds on the plantar aspect of his left first second toe web space and on the dorsal aspect of the right first second toe web space. 09/22/17; there continues to be very gradual improvements in the dimensions of the left lateral leg wound. He hasn't round erythematous spot with might be pressure on his wheelchair. There is no evidence obviously of infection no purulence no warmth He has a dry scaled area on the plantar aspect of the left first second toe Improved area on the dorsal right first second toe. 09/29/17; left lateral leg wound continues to improve in dimensions mostly with an is still a fairly long but increasingly narrow wound. He has a dry scaled area on the plantar aspect of his left first second toe web space Increasingly concerning area on the dorsal right first second toe. In fact I am concerned today about possible cellulitis around this wound. The areas extending up his second toe and although there is deformities here almost appears to abut on the nailbed. 10/06/17; left lateral leg wound continues to make very gradual progress. Tissue culture I did from the right first second toe dorsal foot last time grew MRSA and enterococcus which  was vancomycin sensitive. This was not sensitive to clindamycin or doxycycline. He is allergic to Zyvox and sulfa we have therefore arrange for him to have dalvance infusion tomorrow. He is had this in the past and tolerated it well 10/20/17; left lateral leg wound continues to make decent progress. This is certainly reduced in terms of with there is advancing epithelialization.The cellulitis in the right foot looks better although he still has a deep wound in the dorsal aspect of the first second toe web space. Plantar left first toe web space on the left I think is making some progress 10/27/17; left lateral leg wound continues to make decent progress. Advancing epithelialization.using Hydrofera Blue The right first second toe web space wound is better-looking using silver alginate Improvement in the left plantar first second toe web space. Again using silver alginate 11/03/17 left lateral leg wound continues to make decent progress albeit slowly. Using [facility] The right per second toe web space continues to be a very problematic looking punched out wound. I obtained a piece of tissue for deep culture I did extensively treated this for fungus. It is difficult to imagine that this is a pressure area as the patient states other than going outside he doesn't really wear shoes at home The left plantar first second toe web space looked fairly senescent. Necrotic edges. This required debridement change to [facility] Blue to all wound areas 11/10/17; left lateral leg wound continues to contract. Using Hydrofera Blue On the right dorsal first second toe web space dorsally. Culture I did of this area last week grew MRSA there is not an easy oral option in this patient was multiple antibiotic allergies or intolerances. This was only a rare culture isolate I'm therefore going to use Bactroban under silver alginate On the left plantar first second toe web space. Debridement is required here. This is also  unchanged 11/17/17; left lateral leg wound continues to contract using Hydrofera Blue this is no longer the major issue. The major concern here is the right first second toe web space. He now has an open area going from dorsally to the plantar aspect. There is now wound on the inner lateral part of the first toe. Not a very viable surface on this. There is erythema spreading medially into  the forefoot. No major change in the left first second toe plantar wound 11/24/17; left lateral leg wound continues to contract using Hydrofera Blue. Nice improvement today The right first second toe web space all of this looks a lot less angry than last week. I have given him clindamycin and topical Bactroban for MRSA and terbinafine for the possibility of underlining tinea pedis that I could not control with ketoconazole. Looks somewhat better The area on the plantar left first second toe web space is weeping with dried debris around the wound 12/01/17; left lateral leg wound continues to contract he Hydrofera Blue. It is becoming thinner in terms of with nevertheless it is making good improvement. The right first second toe web space looks less angry but still a large necrotic-looking wounds starting on the plantar aspect of the right foot extending between the toes and now extensively on the base of the right second toe. I gave him clindamycin and topical Bactroban for MRSA anterior benefiting for the possibility of underlying tinea pedis. Not looking better today The area on the left first/second toe looks better. Debrided of necrotic debris 12/05/17* the patient was worked in urgently today because over the weekend he found blood on his incontinence bad when he woke up. He was found to have an ulcer by his wife who does most of his wound care. He came in today for [place] to look at this. He has not had a history of wounds in his buttocks in spite of his paraplegia. 12/08/17; seen in follow-up today at his usual  appointment. He was seen earlier this week and found to have a new wound on his buttock. We also follow him for wounds on the left lateral leg, left first second toe web space and right first second toe web space 12/15/17; we have been using Hydrofera Blue to the left lateral leg which has improved. The right first second toe web space has also improved. Left first Nary, Kawika E ([id number]) [id number] Page 7 of 37 second toe web space plantar aspect looks stable. The left buttock has worsened using Santyl. Apparently the buttock has drainage 12/22/17; we have been using Hydrofera Blue to the left lateral leg which continues to improve now 2 small wounds separated by normal skin. He tells [place] he had a fever up to 100 yesterday he is prone to UTIs but has not noted anything different. He does in and out catheterizations. The area between the first and second toes today does not look good necrotic surface covered with what looks to be purulent drainage and erythema extending into the third toe. I had gotten this to something that I thought look better last time however it is not look good today. He also has a necrotic surface over the buttock wound which is expanded. I thought there might be infection under here so I removed a lot of the surface with a #5 curet though nothing look like it really needed culturing. He is been using Santyl to this area 12/27/17; his original wound on the left lateral leg continues to improve using Hydrofera Blue. I gave him samples of Baxdella although he was unable to take them out of fear for an allergic reaction ["lump in his throat"].the culture I did of the purulent drainage from his second toe last week showed both enterococcus and a set Enterobacter I was also concerned about the erythema on the bottom of his foot although paradoxically although this looks somewhat better today. Finally his pressure ulcer  on the left buttock looks worse this is  clearly now a stage III wound necrotic surface requiring debridement. We've been using silver alginate here. They came up today that he sleeps in a recliner, I'm not sure why but I asked him to stop this 01/03/18; his original wound we've been using Hydrofera Blue is now separated into 2 areas. Ulcer on his left buttock is better he is off the recliner and sleeping in bed Finally both wound areas between his first and second toes also looks some better 01/10/18; his original wound on the left lateral leg is now separated into 2 wounds we've been using Hydrofera Blue Ulcer on his left buttock has some drainage. There is a small probing site going into muscle layer superiorly.using silver alginate -He arrives today with a deep tissue injury on the left heel The wound on the dorsal aspect of his first second toe on the left looks a lot betterusing silver alginate ketoconazole The area on the first second toe web space on the right also looks a lot bette 01/17/18; his original wound on the left lateral leg continues to progress using Hydrofera Blue Ulcer on his left buttock also is smaller surface healthier except for a small probing site going into the muscle layer superiorly. 2.4 cm of tunneling in this area DTI on his left heel we have only been offloading. Looks better than last week no threatened open no evidence of infection the wound on the dorsal aspect of the first second toe on the left continues to look like it's regressing we have only been using silver alginate and terbinafine orally The area in the first second toe web space on the right also looks to be a lot better using silver alginate and terbinafine I think this was prompted by tinea pedis 01/31/18; the patient was hospitalized in [place] last week apparently for a complicated UTI. He was discharged on cefepime he does in and out catheterizations. In the hospital he was discovered M I don't mild elevation of AST and ALT and the  terbinafine was stopped.predictably the pressure ulcer on s his buttock looks betterusing silver alginate. The area on the left lateral leg also is better using Hydrofera Blue. The area between the first and second toes on the left better. First and second toes on the right still substantial but better. Finally the DTI on the left heel has held together and looks like it's resolving 02/07/18-he is here in follow-up evaluation for multiple ulcerations. He has new injury to the lateral aspect of the last issue a pressure ulcer, he states this is from adhesive removal trauma. He states he has tried multiple adhesive products with no success. All other ulcers appear stable. The left heel DTI is resolving. We will continue with same treatment plan and follow-up next week. 02/14/18; follow-up for multiple areas. He has a new area last week on the lateral aspect of his pressure ulcer more over the posterior trochanter. The original pressure ulcer looks quite stable has healthy granulation. 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 Almost fully epithelialized on the original superior area using Hydrofera Blue DTI on the left heel has peeled off this week to reveal a small superficial wound under denuded skin and subcutaneous tissue Both areas between the first and second toes look better including nothing open on the left 02/21/18; The patient's wounds on his left ischial tuberosity and  posterior left greater trochanter actually looked better. He has a large area of irritation around the area which I think is contact dermatitis. I am doubtful that this is fungal His original wound on the left lateral calf continues to improve we have been using Hydrofera Blue There is no open area in the left first second toe web space although there is a lot of thick callus The DTI on the left heel required debridement  today of necrotic surface eschar and subcutaneous tissue using silver alginate Finally the area on the right first second toe webspace continues to contract using silver alginate and ketoconazole 02/28/18 Left ischial tuberosity wounds look better using silver alginate. Original wound on the left calf only has one small open area left using Hydrofera Blue DTI on the left heel required debridement mostly removing skin from around this wound surface. Using silver alginate The areas on the right first/second toe web space using silver alginate and ketoconazole 03/08/18 on evaluation today patient appears to be doing decently well as best I can tell in regard to his wounds. This is the first time that I have seen him as he generally is followed by Dr. Leanord Hawking. With that being said none of his wounds appear to be infected he does have an area where there is some skin covering what appears to be a new wound on the left dorsal surface of his great toe. This is right at the nail bed. With that being said I do believe that debrided away some of the excess skin can be of benefit in this regard. Otherwise he has been tolerating the dressing changes without complication. 03/14/18; patient arrives today with the multiplicity of wounds that we are following. He has not been systemically unwell Original wound on the left lateral calf now only has 2 small open areas we've been using Hydrofera Blue which should continue The deep tissue injury on the left heel requires debridement today. We've been using silver alginate The left first second toe and the right first second toe are both are reminiscence what I think was tinea pedis. Apparently some of the callus Surface between the toes was removed last week when it started draining. Purulent drainage coming from the wound on the ischial tuberosity on the left. 03/21/18-He is here in follow-up evaluation for multiple wounds. 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 He tolerated debridement. The only change to last week's recommendations is to discontinue antifungal cream between toes. He will follow-up next week 03/28/18; following up for multiple wounds;Concern this week is streaking redness and swelling in the right foot. He is going to need antibiotics for this. 03/31/18; follow-up for right foot cellulitis. Streaking redness and swelling in the right foot on 03/28/18. 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 I put him on clindamycin 300 mg every 6 and brought him in for a quick check. He has an open wound between his first and second toes on the right foot as a potential source. 04/04/18; Right foot cellulitis is resolving he is completing clindamycin. This is truly good news Left lateral calf wound which is initial wound only has one small open area inferiorly this is close to healing out. He has compression stockings. We will use Hydrofera Blue right down to the epithelialization of this Nonviable surface on the left heel which was initially pressure with a DTI. We've been using Hydrofera Blue. I'm going to switch this back  to silver alginate Left first second toe/tinea pedis this looks better using silver alginate Right first second toe tinea pedis using silver alginate Large pressure ulcers on theLeft ischial tuberosity. Small wound here Looks better. I am uncertain about the surface over the large wound. Using silver alginate 04/11/18; Cellulitis in the right foot is resolved Left lateral calf wound which was his original wounds still has 2 tiny open areas remaining this is just about closed Nonviable surface on the left heel is better but still requires debridement Left first second toe/tinea pedis still open using silver alginate Surprenant, Eon E ([id number]) [id number] Page 8 of 37 Right first second toe wound tinea pedis I asked  him to go back to using ketoconazole and silver alginate Large pressure ulcers on the left ischial tuberosity this shear injury here is resolved. Wound is smaller. No evidence of infection using silver alginate 04/18/18; Patient arrives with an intense area of cellulitis in the right mid lower calf extending into the right heel area. Bright red and warm. Smaller area on the left anterior leg. He has a significant history of MRSA. He will definitely need antibioticsdoxycycline He now has 2 open areas on the left ischial tuberosity the original large wound and now a satellite area which I think was above his initial satellite areas. Not a wonderful surface on this satellite area surrounding erythema which looks like pressure related. His left lateral calf wound again his original wound is just about closed Left heel pressure injury still requiring debridement Left first second toe looks a lot better using silver alginate Right first second toe also using silver alginate and ketoconazole cream also looks better 04/20/18; the patient was worked in early today out of concerns with his cellulitis on the right leg. I had started him on doxycycline. This was 2 days ago. His wife was concerned about the swelling in the area. Also concerned about the left buttock. He has not been systemically unwell no fever chills. No nausea vomiting or diarrhea 04/25/18; the patient's left buttock wound is continued to deteriorate he is using Hydrofera Blue. He is still completing clindamycin for the cellulitis on the right leg although all of this looks better. 05/02/18 Left buttock wound still with a lot of drainage and a very tightly adherent fibrinous necrotic surface. He has a deeper area superiorly The left lateral calf wound is still closed DTI wound on the left heel necrotic surface especially the circumference using Iodoflex Areas between his left first second toe and right first second toe both look better. Dorsally  and the right first second toe he had a necrotic surface although at smaller. In using silver alginate and ketoconazole. I did a culture last week which was a deep tissue culture of the reminiscence of the open wound on the right first second toe dorsally. This grew a few Acinetobacter and a few methicillin-resistant staph aureus. Nevertheless the area actually this week looked better. I didn't feel the need to specifically address this at least in terms of systemic antibiotics. 05/09/18; wounds are measuring larger more drainage per our intake. We are using Santyl covered with alginate on the large superficial buttock wounds, Iodosorb on the left heel, ketoconazole and silver alginate to the dorsal first and second toes bilaterally. 05/16/18; The area on his left buttock better in some aspects although the area superiorly over the ischial tuberosity required an extensive debridement.using Santyl Left heel appears stable. Using Iodoflex The areas between his first and second toes are not  bad however there is spreading erythema up the dorsal aspect of his left foot this looks like cellulitis again. He is insensate the erythema is really very brilliant.o Erysipelas He went to see an allergist days ago because he was itching part of this he had lab work done. This showed a white count of 15.1 with 70% neutrophils. Hemoglobin of 11.4 and a platelet count of 659,000. Last white count we had in Epic was a 2-1/2 years ago which was 25.9 but he was ill at the time. He was able to show me some lab work that was done by his primary physician the pattern is about the same. 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 But prompted me to go ahead and do x-rays of both feet and the pelvis rule out osteomyelitis. He also had a comprehensive metabolic panel this was reasonably normal his albumin was 3.7 liver function tests BUN/creatinine all normal 05/23/18; x-rays of both  his feet from last week were negative for underlying pulmonary abnormality. The x-ray of his pelvis however showed mild irregularity in the left ischial which may represent some early osteomyelitis. The wound in the left ischial continues to get deeper clearly now exposed muscle. Each week necrotic surface material over this area. Whereas the rest of the wounds do not look so bad. The left ischial wound we have been using Santyl and calcium alginate T the left heel surface necrotic debris using Iodoflex o The left lateral leg is still healed Areas on the left dorsal foot and the right dorsal foot are about the same. There is some inflammation on the left which might represent contact dermatitis, fungal dermatitis I am doubtful cellulitis although this looks better than last week 05/30/18; CT scan done at Hospital did not show any osteomyelitis or abscess. Suggested the possibility of underlying cellulitis although I don't see a lot of evidence of this at the bedside The wound itself on the left buttock/upper thigh actually looks somewhat better. No debridement Left heel also looks better no debridement continue Iodoflex Both dorsal first second toe spaces appear better using Lotrisone. Left still required debridement 06/06/18; Intake reported some purulent looking drainage from the left gluteal wound. Using Santyl and calcium alginate Left heel looks better although still a nonviable surface requiring debridement The left dorsal foot first/second webspace actually expanding and somewhat deeper. I may consider doing a shave biopsy of this area Right dorsal foot first/second webspace appears stable to improved. Using Lotrisone and silver alginate to both these areas 06/13/18 Left gluteal surface looks better. Now separated in the 2 wounds. No debridement required. Still drainage. We'll continue silver alginate Left heel continues to look better with Iodoflex continue this for at least another week Of  his dorsal foot wounds the area on the left still has some depth although it looks better than last week. We've been using Lotrisone and silver alginate 06/20/18 Left gluteal continues to look better healthy tissue Left heel continues to look better healthy granulation wound is smaller. He is using Iodoflex and his long as this continues continue the Iodoflex Dorsal right foot looks better unfortunately dorsal left foot does not. There is swelling and erythema of his forefoot. He had minor trauma to this several days ago but doesn't think this was enough to have caused any tissue injury. Foot looks like cellulitis, we have had this problem before 06/27/18 on evaluation today patient appears to be doing a little worse  in regard to his foot ulcer. 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 Both of which would really be his only options for treating this infection. In the past he has been given and effusion of Orbactiv. This is done very well for him in the past again it's one time dosing IV antibiotic therapy. Subsequently I do believe this is something we're gonna need to see about doing at this point in time. Currently his other wounds seem to be doing somewhat better in my pinion I'm pretty happy in that regard. 07/03/18 on evaluation today patient's wounds actually appear to be doing fairly well. He has been tolerating the dressing changes without complication. All in all he seems to be showing signs of improvement. In regard to the antibiotics he has been dealing with infectious disease since I saw him last week as far as getting this scheduled. In the end [facility]'s [facility] this coming Friday. In the meantime he has been continuing to perform the dressing changes in such as previous. There does not appear to  be any evidence of infection worsengin at this time. 07/10/18; Since I last saw this man 2 weeks ago things have actually improved. IV antibiotics of resulted in less forefoot erythema although there is still some present. He is not systemically unwell Left buttock wounds 2 now have no depth there is increased epithelialization Using silver alginate Left heel still requires debridement using Iodoflex Left dorsal foot still with a sizable wound about the size of a border but healthy granulation Right dorsal foot still with a slitlike area using silver alginate 07/18/18; the patient's cellulitis in the left foot is improved in fact I think it is on its way to resolving. Left buttock wounds 2 both look better although the larger one has hypertension granulation we've been using silver alginate Left heel has some thick circumferential redundant skin over the wound edge which will need to be removed today we've been using Iodoflex Left dorsal foot is still a sizable wound required debridement using silver alginate The right dorsal foot is just about closed only a small open area remains here 07/25/18; left foot cellulitis is resolved Left buttock wounds 2 both look better. Hyper-granulation on the major area Left heel as some debris over the surface but otherwise looks a healthier wound. Using silver collagen Gorton, Yusif E ([id number]) [id number] Page 9 of 37 Right dorsal foot is just about closed 07/31/18; arrives with our intake nurse worried about purulent drainage from the buttock. We had hyper-granulation here last week His buttock wounds 2 continue to look better Left heel some debris over the surface but measuring smaller. Right dorsal foot unfortunately has openings between the toes Left foot superficial wound looks less aggravated. 08/07/18 Buttock wounds continue to look better although some of her granulation and the larger medial wound. silver alginate Left heel  continues to look a lot better.silver collagen Left foot superficial wound looks less stable. Requires debridement. He has a new wound superficial area on the foot on the lateral dorsal foot. Right foot looks better using silver alginate without Lotrisone 08/14/2018; patient was in the ER last week diagnosed with a UTI. He is now on Cefpodoxime and Macrodantin. Buttock wounds continued to be smaller. Using silver alginate Left heel continues to look better  using silver collagen Left foot superficial wound looks as though it is improving Right dorsal foot area is just about healed. 08/21/2018; patient is completed his antibiotics for his UTI. He has 2 open areas on the buttocks. There is still not closed although the surface looks satisfactory. Using silver alginate Left heel continues to improve using silver collagen The bilateral dorsal foot areas which are at the base of his first and second toes/possible tinea pedis are actually stable on the left but worse on the right. The area on the left required debridement of necrotic surface. After debridement I obtained a specimen for PCR culture. The right dorsal foot which is been just about healed last week is now reopened 08/28/2018; culture done on the left dorsal foot showed coag negative staph both staph epidermidis and Lugdunensis. I think this is worthwhile initiating systemic treatment. I will use doxycycline given his long list of allergies. The area on the left heel slightly improved but still requiring debridement. The large wound on the buttock is just about closed whereas the smaller one is larger. Using silver alginate in this area 09/04/2018; patient is completing his doxycycline for the left foot although this continues to be a very difficult wound area with very adherent necrotic debris. We are using silver alginate to all his wounds right foot left foot and the small wounds on his buttock, silver collagen on the left heel. 09/11/2018;  once again this patient has intense erythema and swelling of the left forefoot. Lesser degrees of erythema in the right foot. He has a long list of allergies and intolerances. I will reinstitute doxycycline. 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 Using silver alginate Left heel also looks better using silver collagen Unfortunately both the areas on his feet look worse. The area on the left first second webspace is now gone through to the plantar part of his foot. The area on the left foot anteriorly is irritated with erythema and swelling in the forefoot. 09/25/2018 His wound on the left plantar heel looks better. Using silver collagen The area on the left buttock 2 small remnant areas. One is closed one is still open. Using silver alginate The areas between both his first and second toes look worse. This in spite of long-standing antifungal therapy with ketoconazole and silver alginate which should have antifungal activity He has small areas around his original wound on the left calf one is on the bottom of the original scar tissue and one superiorly both of these are small and superficial but again given wound history in this site this is worrisome 10/02/2018 Left plantar heel continues to gradually contract using silver collagen Left buttock wound is unchanged using silver alginate The areas on his dorsal feet between his first and second toes bilaterally look about the same. I prescribed clindamycin ointment to see if we can address chronic staph colonization and also the underlying possibility of erythrasma The left lateral lower extremity wound is actually on the lateral part of his ankle. Small open area here. We have been using silver alginate 10/09/2018; Left plantar heel continues to look healthy and contract. No debridement is required Left buttock slightly smaller with a tape injury wound just below which was new this week Dorsal  feet somewhat improved I have been using clindamycin Left lateral looks lower extremity the actual open area looks worse although a lot of this is epithelialized. I am going to change to  silver collagen today He has a lot more swelling in the right leg although this is not pitting not red and not particularly warm there is a lot of spasm in the right leg usually indicative of people with paralysis of some underlying discomfort. We have reviewed his vascular status from 2017 he had a left greater saphenous vein ablation. I wonder about referring him back to vascular surgery if the area on the left leg continues to deteriorate. 10/16/2018 in today for follow-up and management of multiple lower extremity ulcers. His left Buttock wound is much lower smaller and almost closed completely. The wound to the left ankle has began to reopen with Epithelialization and some adherent slough. He has multiple new areas to the left foot and leg. The left dorsal foot without much improvement. Wound present between left great webspace and 2nd toe. Erythema and edema present right leg. Right LE ultrasound obtained on 10/10/18 was negative for DVT . 10/23/2018; Left buttock is closed over. Still dry macerated skin but there is no open wound. I suspect this is chronic pressure/moisture Left lateral calf is quite a bit worse than when I saw this last. There is clearly drainage here he has macerated skin into the left plantar heel. We will change the primary dressing to alginate Left dorsal foot has some improvement in overall wound area. Still using clindamycin and silver alginate Right dorsal foot about the same as the left using clindamycin and silver alginate The erythema in the right leg has resolved. He is DVT rule out was negative Left heel pressure area required debridement although the wound is smaller and the surface is health 10/26/2018 The patient came back in for his nurse check today predominantly because of  the drainage coming out of the left lateral leg with a recent reopening of his original wound on the left lateral calf. He comes in today with a large amount of surrounding erythema around the wound extending from the calf into the ankle and even in the area on the dorsal foot. He is not systemically unwell. He is not febrile. Nevertheless this looks like cellulitis. We have been using silver alginate to the area. I changed him to a regular visit and I am going to prescribe him doxycycline. The rationale here is a long list of medication intolerances and a history of MRSA. I did not see anything that I thought would provide a valuable culture 10/30/2018 Follow-up from his appointment 4 days ago with really an extensive area of cellulitis in the left calf left lateral ankle and left dorsal foot. I put him on doxycycline. He has a long list of medication allergies which are true allergy reactions. 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 In any case he is a lot better today. The erythema especially in the anterior and lateral left calf is better. He still has left ankle erythema. He also is complaining about increasing edema in the right leg we have only been using Kerlix Coban and he has been doing the wraps at home. Finally he has a spotty rash on the medial part of his upper left calf which looks like folliculitis or perhaps wrap occlusion type injury. Small superficial macules not pustules 11/06/18 patient arrives today with again a considerable degree of erythema around the wound on the left lateral calf extending into the dorsal ankle and dorsal foot. This is a lot worse than when I saw this last week.  He is on doxycycline really with not a lot of improvement. He has not been systemically unwell Wounds on the; left heel actually looks improved. Original area on the left foot and proximity to the first and second toes looks about  the same. He has superficial areas on the dorsal foot, anterior calf and then the reopening of his original wound on the left lateral calf which looks about the same The only area he has on the right is the dorsal webspace first and second which is smaller. Erekson, WILLAIM MODE ([id number]) [id number] Page 10 of 37 He has a large area of dry erythematous skin on the left buttock small open area here. 11/13/2018; the patient arrives in much better condition. The erythema around the wound on the left lateral calf is a lot better. Not sure whether this was the clindamycin or the TCA and ketoconazole or just in the improvement in edema control [stasis dermatitis]. In any case this is a lot better. The area on the left heel is very small and just about resolved using silver collagen we have been using silver alginate to the areas on his dorsal feet 11/20/2018; his wounds include the left lateral calf, left heel, dorsal aspects of both feet just proximal to the first second webspace. He is stable to slightly improved. I did not think any changes to his dressings were going to be necessary 11/27/2018 he has a reopening on the left buttock which is surrounded by what looks like tinea or perhaps some other form of dermatitis. The area on the left dorsal foot has some erythema around it I have marked this area but I am not sure whether this is cellulitis or not. Left heel is not closed. Left calf the reopening is really slightly longer and probably worse 1/13; in general things look better and smaller except for the left dorsal foot. Area on the left heel is just about closed, left buttock looks better only a small wound remains in the skin looks better [using Lotrisone] 1/20; the area on the left heel only has a few remaining open areas here. Left lateral calf about the same in terms of size, left dorsal foot slightly larger right lateral foot still not closed. The area on the left buttock  has no open wound and the surrounding skin looks a lot better 1/27; the area on the left heel is closed. Left lateral calf better but still requiring extensive debridements. The area on his left buttock is closed. He still has the open areas on the left dorsal foot which is slightly smaller in the right foot which is slightly expanded. We have been using Iodoflex on these areas as well 2/3; left heel is closed. Left lateral calf still requiring debridement using Iodoflex there is no open area on his left buttock however he has dry scaly skin over a large area of this. Not really responding well to the Lotrisone. Finally the areas on his dorsal feet at the level of the first second webspace are slightly smaller on the right and about the same on the left. Both of these vigorously debrided with Anasept and gauze 2/10; left heel remains closed he has dry erythematous skin over the left buttock but there is no open wound here. Left lateral leg has come in and with. Still requiring debridement we have been using Iodoflex here. Finally the area on the left dorsal foot and right dorsal foot are really about the same extremely dry callused fissured areas. He does  not yet have a dermatology appointment 2/17; left heel remains closed. He has a new open area on the left buttock. The area on the left lateral calf is bigger longer and still covered in necrotic debris. No major change in his foot areas bilaterally. I am awaiting for a dermatologist to look on this. We have been using ketoconazole I do not know that this is been doing any good at all. 2/24; left heel remains closed. The left buttock wound that was new reopening last week looks better. The left lateral calf appears better also although still requires debridement. The major area on his foot is the left first second also requiring debridement. We have been putting Prisma on all wounds. I do not believe that the ketoconazole has done too much good for  his feet. He will use Lotrisone I am going to give him a 2-week course of terbinafine. We still do not have a dermatology appointment 3/2 left heel remains closed however there is skin over bone in this area I pointed this out to him today. The left buttock wound is epithelialized but still does not look completely stable. The area on the left leg required debridement were using silver collagen here. With regards to his feet we changed to Lotrisone last week and silver alginate. 3/9; left heel remains closed. Left buttock remains closed. The area on the right foot is essentially closed. The left foot remains unchanged. Slightly smaller on the left lateral calf. Using silver collagen to both of these areas 3/16-Left heel remains closed. Area on right foot is closed. Left lateral calf above the lateral malleolus open wound requiring debridement with easy bleeding. Left dorsal wound proximal to first toe also debrided. Left ischial area open new. Patient has been using Prisma with wrapping every 3 days. Dermatology appointment is apparently tomorrow.Patient has completed his terbinafine 2-week course with some apparent improvement according to him, there is still flaking and dry skin in his foot on the left 3/23; area on the right foot is reopened. The area on the left anterior foot is about the same still a very necrotic adherent surface. He still has the area on the left leg and reopening is on the left buttock. He apparently saw dermatology although I do not have a note. According to the patient who is usually fairly well informed they did not have any good ideas. Put him on oral terbinafine which she is been on before. 3/30; using silver collagen to all wounds. Apparently his dermatologist put him on doxycycline and rifampin presumably some culture grew staph. I do not have this result. He remains on terbinafine although I have used terbinafine on him before 4/6; patient has had a fairly  substantial reopening on the right foot between the first and second toes. He is finished his terbinafine and I believe is on doxycycline and rifampin still as prescribed by dermatology. We have been using silver collagen to all his wounds although the patient reports that he thinks silver alginate does better on the wounds on his buttock. 4/13; the area on his left lateral calf about the same size but it did not require debridement. Left dorsal foot just proximal to the webspace between the first and second toes is about the same. Still nonviable surface. I note some superficial bronze discoloration of the dorsal part of his foot Right dorsal foot just proximal to the first and second toes also looks about the same. I still think there may be the same discoloration  I noted above on the left Left buttock wound looks about the same 4/20; left lateral calf appears to be gradually contracting using silver collagen. 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 The left dorsal foot wound is debrided of tightly adherent necrotic debris and really cleans up quite nicely. The right area is worse with expansion. I did not debride this it is now over the base of the second toe The area on his left buttock is smaller no debridement is required using silver collagen 5/4; left calf continues to make good progress. He arrives with erythema around the wounds on his dorsal foot which even extends to the plantar aspect. Very concerning for coexistent infection. He is finished the erythromycin I gave him for possible erythrasma this does not seem to have helped. The area on the left foot is about the same base of the dorsal toes Is area on the buttock looks improved on the left 5/11; left calf and left buttock continued to make good progress. Left foot is about the same to slightly improved. Major problem is on the right foot. He has not had an x-ray. Deep  tissue culture I did last week showed both Enterobacter and E. coli. I did not change the doxycycline I put him on empirically although neither 1 of these were plated to doxycycline. He arrives today with the erythema looking worse on both the dorsal and plantar foot. Macerated skin on the bottom of the foot. he has not been systemically unwell 5/18-Patient returns at 1 week, left calf wound appears to be making some progress, left buttock wound appears slightly worse than last time, left foot wound looks slightly better, right foot redness is marginally better. X-ray of both feet show no air or evidence of osteomyelitis. Patient is finished his Omnicef and terbinafine. He continues to have macerated skin on the bottom of the left foot as well as right 5/26; left calf wound is better, left buttock wound appears to have multiple small superficial open areas with surrounding macerated skin. X-rays that I did last time showed no evidence of osteomyelitis in either foot. He is finished cefdinir and doxycycline. I do not think that he was on terbinafine. He continues to have a large superficial open area on the right foot anterior dorsal and slightly between the first and second toes. I did send him to dermatology 2 months ago or so wondering about whether they would do a fungal scraping. I do not believe they did but did do a culture. We have been using silver alginate to the toe areas, he has been using antifungals at home topically either ketoconazole or Lotrisone. We are using silver collagen on the left foot, silver alginate on the right, silver collagen on the left lateral leg and silver alginate on the left buttock 6/1; left buttock area is healed. We have the left dorsal foot, left lateral leg and right dorsal foot. We are using silver alginate to the areas on both feet and silver collagen to the area on his left lateral calf 6/8; the left buttock apparently reopened late last week. He is not really  sure how this happened. He is tolerating the terbinafine. Using silver alginate to all wounds 6/15; left buttock wound is larger than last week but still superficial. Came in the clinic today with a report of purulence from the left lateral leg I did not identify any infection Both areas on his dorsal feet appear to be better.  He is tolerating the terbinafine. Using silver alginate to all wounds 6/22; left buttock is about the same this week, left calf quite a bit better. His left foot is about the same however he comes in with erythema and warmth in the right forefoot once again. Culture that I gave him in the beginning of May showed Enterobacter and E. coli. I gave him doxycycline and things seem to improve although neither 1 of these organisms was specifically plated. 6/29; left buttock is larger and dry this week. Left lateral calf looks to me to be improved. Left dorsal foot also somewhat improved right foot completely unchanged. The erythema on the right foot is still present. He is completing the Ceftin dinner that I gave him empirically [see discussion above.) 7/6 - All wounds look to be stable and perhaps improved, the left buttock wound is slightly smaller, per patient bleeds easily, completed ceftin, the right foot redness is less, he is on terbinafine Calloway, Zymeir E ([id number]) [id number] Page 11 of 37 7/13; left buttock wound about the same perhaps slightly narrower. Area on the left lateral leg continues to narrow. Left dorsal foot slightly smaller right foot about the same. We are using silver alginate on the right foot and Hydrofera Blue to the areas on the left. Unna boot on the left 2 layer compression on the right 7/20; left buttock wound absolutely the same. Area on lateral leg continues to get better. Left dorsal foot require debridement as did the right no major change in the 7/27; left buttock wound the same size necrotic debris over the surface.  The area on the lateral leg is closed once again. His left foot looks better right foot about the same although there is some involvement now of the posterior first second toe area. He is still on terbinafine which I have given him for a month, not certain a centimeter major change 06/25/19-All wounds appear to be slightly improved according to report, left buttock wound looks clean, both foot wounds have minimal to no debris the right dorsal foot has minimal slough. We are using Hydrofera Blue to the left and silver alginate to the right foot and ischial wound. 8/10-Wounds all appear to be around the same, the right forefoot distal part has some redness which was not there before, however the wound looks clean and small. Ischial wound looks about the same with no changes 8/17; his wound on the left lateral calf which was his original chronic venous insufficiency wound remains closed. Since I last saw him the areas on the left dorsal foot right dorsal foot generally appear better but require debridement. The area on his left initial tuberosity appears somewhat larger to me perhaps hyper granulated and bleeds very easily. We have been using Hydrofera Blue to the left dorsal foot and silver alginate to everything else 8/24; left lateral calf remains closed. The areas on his dorsal feet on the webspace of the first and second toes bilaterally both look better. The area on the left buttock which is the pressure ulcer stage II slightly smaller. I change the dressing to Hydrofera Blue to all areas 8/31; left lateral calf remains closed. The area on his dorsal feet bilaterally look better. Using Hydrofera Blue. Still requiring debridement on the left foot. No change in the left buttock pressure ulcers however 9/14; left lateral calf remains closed. Dorsal feet look quite a bit better than 2 weeks ago. Flaking dry skin also a lot better with the ammonium lactate I gave  him 2 weeks ago. The area on the left  buttock is improved. He states that his Roho cushion developed a leak and he is getting a new one, in the interim he is offloading this vigorously 9/21; left calf remains closed. Left heel which was a possible DTI looks better this week. He had macerated tissue around the left dorsal foot right foot looks satisfactory and improved left buttock wound. I changed his dressings to his feet to silver alginate bilaterally. Continuing Hydrofera Blue on the left buttock. 9/28 left calf remains closed. Left heel did not develop anything [possible DTI] dry flaking skin on the left dorsal foot. Right foot looks satisfactory. Improved left buttock wound. We are using silver alginate on his feet Hydrofera Blue on the buttock. I have asked him to go back to the Lotrisone on his feet including the wounds and surrounding areas 10/5; left calf remains closed. The areas on the left and right feet about the same. A lot of this is epithelialized however debris over the remaining open areas. He is using Lotrisone and silver alginate. The area on the left buttock using Hydrofera Blue 10/26. Patient has been out for 3 weeks secondary to Covid concerns. He tested negative but I think his wife tested positive. He comes in today with the left foot substantially worse, right foot about the same. Even more concerning he states that the area on his left buttock closed over but then reopened and is considerably deeper in one aspect than it was before [stage III wound] 11/2; left foot really about the same as last week. Quarter sized wound on the dorsal foot just proximal to the first second toes. Surrounding erythema with areas of denuded epithelium. This is not really much different looking. Did not look like cellulitis this time however. Right foot area about the same.. We have been using silver alginate alginate on his toes Left buttock still substantial irritated skin around the wound which I think looks somewhat better. We  have been using Hydrofera Blue here. 11/9; left foot larger than last week and a very necrotic surface. Right foot I think is about the same perhaps slightly smaller. Debris around the circumference also addressed. Unfortunately on the left buttock there is been a decline. Satellite lesions below the major wound distally and now a an additional one posteriorly we have been using Hydrofera Blue but I think this is a pressure issue 11/16; left foot ulcer dorsally again a very adherent necrotic surface. Right foot is about the same. Not much change in the pressure ulcer on his left buttock. 11/30; left foot ulcer dorsally basically the same as when I saw him 2 weeks ago. Very adherent fibrinous debris on the wound surface. Patient reports a lot of drainage as well. 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 We have been using Iodoflex, patient changed back to alginate because of the drainage. Area on his right dorsal foot really looks benign with a healthier surface certainly a lot better than on the left. Left buttock wounds all improved using Hydrofera Blue 12/7; left dorsal foot again no improvement. Tightly adherent debris. PCR culture I did last week only showed likely skin contaminant. I have gone ahead and done a punch biopsy of this which is about the last thing in terms of investigations I can think to do. He has known venous insufficiency and venous hypertension and this could be the issue here. The area on the right foot  is about the same left buttock slightly worse according to our intake nurse secondary to [facility] Blue sticking to the wound 12/14; biopsy of the left foot that I did last time showed changes that could be related to wound healing/chronic stasis dermatitis phenomenon no neoplasm. We have been using silver alginate to both feet. I change the one on the left today to Sorbact and silver alginate to his other 2 wounds 12/28; the  patient arrives with the following problems; Major issue is the dorsal left foot which continues to be a larger deeper wound area. Still with a completely nonviable surface Paradoxically the area mirror image on the right on the right dorsal foot appears to be getting better. He had some loss of dry denuded skin from the lower part of his original wound on the left lateral calf. Some of this area looked a little vulnerable and for this reason we put him in wrap that on this side this week The area on his left buttock is larger. He still has the erythematous circular area which I think is a combination of pressure, sweat. This does not look like cellulitis or fungal dermatitis 11/26/2019; -Dorsal left foot large open wound with depth. Still debris over the surface. Using Sorbact The area on the dorsal right foot paradoxically has closed over He has a reopening on the left ankle laterally at the base of his original wound that extended up into the calf. This appears clean. The left buttock wound is smaller but with very adherent necrotic debris over the surface. We have been using silver alginate here as well The patient had arterial studies done in 2017. He had biphasic waveforms at the dorsalis pedis and posterior tibial bilaterally. ABI in the left was 1.17. Digit waveforms were dampened. He has slight spasticity in the great toes I do not think a TBI would be possible 1/11; the patient comes in today with a sizable reopening between the first and second toes on the right. This is not exactly in the same location where we have been treating wounds previously. According to our intake nurse this was actually fairly deep but 0.6 cm. The area on the left dorsal foot looks about the same the surface is somewhat cleaner using Sorbact, his MRI is in 2 days. We have not managed yet to get arterial studies. The new reopening on the left lateral calf looks somewhat better using alginate. The left buttock wound  is about the same using alginate 1/18; the patient had his ARTERIAL studies which were quite normal. ABI in the right at 1.13 with triphasic/biphasic waveforms on the left ABI 1.06 again with triphasic/biphasic waveforms. It would not have been possible to have done a toe brachial index because of spasticity. We have been using Sorbac to the left foot alginate to the rest of his wounds on the right foot left lateral calf and left buttock 1/25; arrives in clinic with erythema and swelling of the left forefoot worse over the first MTP area. This extends laterally dorsally and but also posteriorly. Still has an area on the left lateral part of the lower part of his calf wound it is eschared and clearly not closed. Area on the left buttock still with surrounding irritation and erythema. Right foot surface wound dorsally. The area between the right and first and second toes appears better. 2/1; The left foot wound is about the same. Erythema slightly better I gave him a week of doxycycline empirically Right foot wound is more  extensive extending between the toes to the plantar surface Left lateral calf really no open surface on the inferior part of his original wound however the entire area still looks vulnerable Absolutely no improvement in the left buttock wound required debridement. 2/8; the left foot is about the same. Erythema is slightly improved I gave him clindamycin last week. Right foot looks better he is using Lotrimin and silver alginate He has a breakdown in the left lateral calf. Denuded epithelium which I have removed Left buttock about the same were using Hydrofera Blue 2/15; left foot is about the same there is less surrounding erythema. Surface still has tightly adherent debris which I have debriding however not making any Goette, Finnian E ([id number]) [id number] Page 12 of 37 progress Right foot has a substantial wound on the medial right second toe between  the first and second webspace. Still an open area on the left lateral calf distal area. Buttock wound is about the same 2/22; left foot is about the same less surrounding erythema. Surface has adherent debris. Polymen Ag Right foot area significant wound between the first and second toes. We have been using silver alginate here Left lateral leg polymen Ag at the base of his original venous insufficiency wound Left buttock some improvement here 3/1; Right foot is deteriorating in the first second toe webspace. Larger and more substantial. We have been using silver alginate. Left dorsal foot about the same markedly adherent surface debris using PolyMem Ag Left lateral calf surface debris using PolyMem AG Left buttock is improved again using PolyMem Ag. He is completing his terbinafine. The erythema in the foot seems better. He has been on this for 2 weeks 3/8; no improvement in any wound area in fact he has a small open area on the dorsal midfoot which is new this week. He has not gotten his foot x-rays yet 3/15; his x-rays were both negative for osteomyelitis of both feet. No major change in any of his wounds on the extremities however his buttock wounds are better. We have been using polymen on the buttocks, left lower leg. Iodoflex on the left foot and silver alginate on the right 3/22; arrives in clinic today with the 2 major issues are the improvement in the left dorsal foot wound which for once actually looks healthy with a nice healthy wound surface without debridement. Using Iodoflex here. Unfortunately on the left lateral calf which is in the distal part of his original wound he came to the clinic here for there was purulent drainage noted some increased breakdown scattered around the original area and a small area proximally. We we are using polymen here will change to silver alginate today. His buttock wound on the left is better and I think the area on the right first second toe webspace  is also improved 3/29; left dorsal foot looks better. Using Iodoflex. Left ankle culture from deterioration last time grew E. coli, Enterobacter and Enterococcus. I will give him a course of cefdinir although that will not cover Enterococcus. The area on the right foot in the webspace of the first and second toe lateral first toe looks better. The area on his buttock is about healed Vascular appointment is on April 21. This is to look at his venous system vis--vis continued breakdown of the wounds on the left including the left lateral leg and left dorsal foot he. He has had previous ablations on this side 4/5; the area between the right first and second toes lateral aspect  of the first toe looks better. Dorsal aspect of the left first toe on the left foot also improved. Unfortunately the left lateral lower leg is larger and there is a second satellite wound superiorly. The usual superficial abrasions on the left buttock overall better but certainly not closed 4/12; the area between the right first and second toes is improved. Dorsal aspect of the left foot also slightly smaller with a vibrant healthy looking surface. No real change in the left lateral leg and the left buttock wound is healed He has an unaffordable co-pay for Apligraf. Appointment with vein and vascular with regards to the left leg venous part of the circulation is on 4/21 4/19; we continue to see improvement in all wound areas. Although this is minor. He has his vascular appointment on 4/21. The area on the left buttock has not reopened although right in the center of this area the skin looks somewhat threatened 4/26; the left buttock is unfortunately reopened. In general his left dorsal foot has a healthy surface and looks somewhat smaller although it was not measured as such. The area between his first and second toe webspace on the right as a small wound against the first toe. The patient saw vascular surgery. The real question  I was asking was about the small saphenous vein on the left. He has previously ablated left greater saphenous vein. Nothing further was commented on on the left. Right greater saphenous vein without reflux at the saphenofemoral junction or proximal thigh there was no indication for ablation of the right greater saphenous vein duplex was negative for DVT bilaterally. They did not think there was anything from a vascular surgery point of view that could be offered. They ABIs within normal limits 5/3; only small open area on the left buttock. The area on the left lateral leg which was his original venous reflux is now 2 wounds both which look clean. We are using Iodoflex on the left dorsal foot which looks healthy and smaller. He is down to a very tiny area between the right first and second toes, using silver alginate 5/10; all of his wounds appear better. We have much better edema control in 4 layer compression on the left. This may be the factor that is allowing the left foot and left lateral calf to heal. He has external compression garments at home 04/14/20-All of his wounds are progressing well, the left forefoot is practically closed, left ischium appears to be about the same, right toe webspace is also smaller. The left lateral leg is about the same, continue using Hydrofera Blue to this, silver alginate to the ischium, Iodoflex to the toe space on the right 6/7; most of his wounds outside of the left buttock are doing well. The area on the left lateral calf and left dorsal foot are smaller. The area on the right foot in between the first and second toe webspace is barely visible although he still says there is some drainage here is the only reason I did not heal this out. Unfortunately the area on the left buttock almost looks like he has a skin tear from tape. He has open wound and then a large flap of skin that we are trying to get adherence over an area just next to the remaining wound 6/21; 2  week follow-up. I believe is been here for nurse visits. Miraculously the area between his first and second toes on the left dorsal foot is closed over. Still open on the right  first second web space. The left lateral calf has 2 open areas. Distally this is more superficial. The proximal area had a little more depth and required debridement of adherent necrotic material. His buttock wound is actually larger we have been using silver alginate here 6/28; the patient's area on the left foot remains closed. Still open wet area between the first and second toes on the right and also extending into the plantar aspect. We have been using silver alginate in this location. He has 2 areas on the left lower leg part of his original long wounds which I think are better. We have been using Hydrofera Blue here. Hydrofera Blue to the left buttock which is stable 7/12; left foot remains closed. Left ankle is closed. May be a small area between his right first and second toes the only truly open area is on the left buttock. We have been using Hydrofera Blue here 7/19; patient arrives with marked deterioration especially in the left foot and ankle. We did not put him in a compression wrap on the left last week in fact he wore his juxta lite stockings on either side although he does not have an underlying stocking. He has a reopening on the left dorsal foot, left lateral ankle and a new area on the right dorsal ankle. More worrisome is the degree of erythema on the left foot extending on the lateral foot into the lateral lower leg on the left 7/26; the patient had erythema and drainage from the lateral left ankle last week. Culture of this grew MRSA resistant to doxycycline and clindamycin which are the 2 antibiotics we usually use with this patient who has multiple antibiotic allergies including linezolid, trimethoprim sulfamethoxazole. I had give him an empiric doxycycline and he comes in the area certainly looks  somewhat better although it is blotchy in his lower leg. He has not been systemically unwell. He has had areas on the left dorsal foot which is a reopening, chronic wounds on the left lateral ankle. Both of these I think are secondary to chronic venous insufficiency. The area between his first and second toes is closed as far as I can tell. He had a new wrap injury on the right dorsal ankle last week. Finally he has an area on the left buttock. We have been using silver alginate to everything except the left buttock we are using Hydrofera Blue 06/30/20-Patient returns at 1 week, has been given a sample dose pack of NUZYRA which is a tetracycline derivative [omadacycline], patient has completed those, we have been using silver alginate to almost all the wounds except the left ischium where we are using Hydrofera Blue all of them look better 8/16; since I last saw the patient he has been doing well. The area on the left buttock, left lateral ankle and left foot are all closed today. He has completed the [place] I gave him last time and tolerated this well. He still has open areas on the right dorsal ankle and in the right first second toe area which we are using silver alginate. 8/23; we put him in his bilateral external compression stockings last week as he did not have anything open on either leg except for concerning area between the right first and second toe. He comes in today with an area on the left dorsal foot slightly more proximal than the original wound, the left lateral foot but this is actually a continuation of the area he had on the left lateral ankle from  last time. As well he is opened up on the left buttock again. 8/30; comes in today with things looking a lot better. The area on the left lower ankle has closed down as has the left foot but with eschar in both areas. The area on the dorsal right ankle is also epithelialized. Very little remaining of the left buttock wound. We have been  using silver alginate on all wound areas 9/13; the area in the first second toe webspace on the right has fully epithelialized. He still has some vulnerable epithelium on the right and the ankle and the dorsal foot. He notes weeping. He is using his juxta lite stocking. On the left again the left dorsal foot is closed left lateral ankle is closed. We went to the juxta lite stocking here as well. Still vulnerable in the left buttock although only 2 small open areas remain here 9/27; 2-week follow-up. We did not look at his left leg but the patient says everything is closed. He is a bit disturbed by the amount of edema in his left foot he is using juxta lite stockings but asking about over the toes stockings which would be 30/40, will talk to him next time. According to him there is no open wound on either the left foot or the left ankle/calf Delon, Amarii E ([id number]) [id number] Page 13 of 37 He has an open area on the dorsal right calf which I initially point a wrap injury. He has superficial remaining wound on the left ischial tuberosity been using silver alginate although he says this sticks to the wound 10/5; we gave him 2-week follow-up but he called yesterday expressing some concerns about his right foot right ankle and the left buttock. He came in early. There is still no open areas on the left leg and that still in his juxta lite stocking 10/11; he only has 1 small area on the left buttock that remains measuring millimeters 1 mm. Still has the same irritated skin in this area. We recommended zinc oxide when this eventually closes and pressure relief is meticulously is he can do this. He still has an area on the dorsal part of his right first through third toes which is a bit irritated and still open and on the dorsal ankle near the crease of the ankle. We have been using silver alginate and using his own stocking. He has nothing open on the left leg or foot 10/25;  2-week follow-up. Not nearly as good on the left buttock as I was hoping. For open areas with 5 looking threatened small. He has the erythematous irritated chronic skin in this area. 1 area on the right dorsal ankle. He reports this area bleeds easily Right dorsal foot just proximal to the base of his toes We have been using silver alginate. 11/8; 2-week follow-up. Left buttock is about the same although I do not think the wounds are in the same location we have been using silver alginate. I have asked him to use zinc oxide on the skin around the wounds. He still has a small area on the right dorsal ankle he reports this bleeds easily Right dorsal foot just proximal to the base of the toes does not have anything open although the skin is very dry and scaly He has a new opening on the nailbed of the left great toe. Nothing on the left ankle 11/29; 3-week follow-up. Left buttock has 2 open areas. And washing of these wounds today started bleeding easily. Suggesting  very friable tissue. We have been using silver alginate. Right dorsal ankle which I thought was initially a wrap injury we have been using silver alginate. Nothing open between the toes that I can see. He states the area on the left dorsal toe nailbed healed after the last visit in 2 or 3 days 12/13; 3-week follow-up. His left buttock now has 3 open areas but the original 2 areas are smaller using polymen here. Surrounding skin looks better. The right dorsal ankle is closed. He has a small opening on the right dorsal foot at the level of the third toe. In general the skin looks better here. He is wearing his juxta lite stocking on the left leg says there is nothing open 11/24/2020; 3 weeks follow-up. His left buttock still has the 3 open areas. We have been using polymen but due to lack of response he changed to [facility] area. Surrounding skin is dry erythematous and irritated looking. There is no evidence of infection either bacterial  or fungal however there is loss of surface epithelium He still has very dry skin in his foot causing irritation and erythema on the dorsal part of his toes. This is not responded to prolonged courses of antifungal simply looks dry and irritated 1/24; left buttock area still looks about the same he was unable to find the triad ointment that we had suggested. The area on the right lower leg just above the dorsal ankle has reopened and the areas on the right foot between the first second and second third toes and scaling on the bottom of the foot has been about the same for quite some time now. been using silver alginate to all wound areas 2/7; left buttock wound looked quite good although not much smaller in terms of surface area surrounding skin looks better. Only a few dry flaking areas on the right foot in between the first and second toes the skin generally looks better here [ammonium lactate]. Finally the area on the right dorsal ankle is closed 2/21; There is no open area on the right foot even between the right first and second toe. Skin around this area dorsally and plantar aspects look better. He has a reopening of the area on the right ankle just above the crease of the ankle dorsally. I continue to think that this is probably friction from spasms may be even this time with his stocking under the compression stockings. Wounds on his left buttock look about the same there a couple of areas that have reopened. He has a total square area of loss of epithelialization. This does not look like infection it looks like a contact dermatitis but I just cannot determine to what 3/14; there is nothing on the right foot between the first and second toes this was carefully inspected under illumination. Some chronic irritation on the dorsal part of his foot from toes 1-3 at the base. Nothing really open here substantially. Still has an area on the right foot/ankle that is actually larger and  hyper granulated. His buttock area on the left is just about closed however he has chronic inflammation with loss of the surface epithelial layer 3/28; 2-week follow-up. In clinic today with a new wound on the left anterior mid tibia. Says this happened about 2 weeks ago. He is not really sure how wonders about the spasticity of his legs at night whether that could have caused this other than that he does not have a good idea. He has been using  topical antibiotics and silver alginate. The area on his right dorsal ankle seems somewhat better. Finally everything on his left buttock is closed. 4/11; 2-week follow-up. All of his wounds are better except for the area over the ischium and left buttock which have opened up widely again. At least part of this is covered in necrotic fibrinous material another part had rolled nonviable skin. The area on the right ankle, left anterior mid tibia are both a lot better. He had no open wounds on either foot including the areas between the first and second toes 4/25; patient presents for 2-week follow-up. He states that the wounds are overall stable. He has no complaints today and states he is using Hydrofera Blue to open wounds. 5/9; have not seen this man in over a month. For my memory he has open areas on the left mid tibia and right ankle. T oday he has new open area on the right dorsal foot which we have not had a problem with recently. He has the sustained area on the left buttock He is also changed his insurance at the beginning of the year [employer]. We will need prior authorizations for debridement 5/23; patient presents for 2-week follow-up. He has prior authorizations for debridement. He denies any issues in the past 2 weeks with his wound care. He has been using Hydrofera Blue to all the wounds. He does report a circular rash to the upper left leg that is new. He denies acute signs of infection. 6/6; 2-week follow-up. The patient has open wounds on  the left buttock which are worse than the last time I saw this about a month ago. He also has a new area to me on the left anterior mid tibia with some surrounding erythema. The area on the dorsal ankle on the right is closed but I think this will be a friction injury every time this area is exposed to either our wraps or his compression stockings caused by unrelenting spasms in this leg. 6/20; 2-week follow-up. The patient has open wounds on the left buttock which is about the same. Using [facility] here. - The left mid tibia has a static amount of surrounding erythema. Also a raised area in the center. We have been using Hydrofera Blue here. Finally he has broken down in his dorsal right foot extending between the first and second toes and going to the base of the first and second toe webspace. I have previously assumed that this was severe venous hypertension 7/5; 2-week follow-up The left buttock wound actually looks better. We are using Hydrofera Blue. He has extensive skin irritation around this area and I have not really been able to get that any better. I have tried Lotrisone i.e. antifungals and steroids. More most recently we have just been using Coloplast really looks about the same. The left mid tibia which was new last week culture to have very resistant staph aureus. Not only methicillin-resistant but doxycycline resistant. The patient has a plethora of antibiotic allergies including sulfa, linezolid. I used topical bacitracin on this but he has not started this yet. In addition he has an expanding area of erythema with a wound on the dorsal right foot. I did a deep tissue culture of this area today 7/12; Left buttock area actually looks better surrounding skin also looks less irritated. Left mid tibia looks about the same. He is using bacitracin this is not worse Right dorsal foot looks about the same as well. The left first toe  also looks about the same 7/19; left buttock wound  continues to improve in terms of open areas Left mid tibia is still concerning amount of swelling he is using bacitracin Dorsal left first toe somewhat smaller Right dorsal foot somewhat smaller 7/25; left buttock wound actually continues to improve Left mid tibia area has less swelling. I gave him all my samples of new Nuzyra. This seems to have helped although the wound is still open it. His abrasion closed by here Left dorsal great toe really no better. Still a very nonviable surface Right dorsal foot perhaps some better. We have been using bacitracin and silver nitrate to the areas on his lower legs and Hydrofera Blue to the area on the buttock. 8/16 Holliman, Haston E ([id number]) [id number] Page 14 of 37 Disappointed that his left buttock wound is actually more substantial. Apparently during the last nurse visit these were both very small. He has continued irritation to a large area of skin on his buttock. I have never been able to totally explain this although I think it some combination of the way he sits, pressure, moisture. He is not incontinent enough to contribute to this. Left dorsal great toe still fibrinous debris on the surface that I have debrided today Large area across the dorsal right toes. The area on the left anterior mid tibia has less swelling. He completed the [place]. This does not look infected although the tissue is still fried 8/30; 2-week follow-up. Left buttock areas not improved. We used Hydrofera Blue on this. Weeping wet with the surrounding erythema that I have not been able to control even with Lotrisone and topical Coloplast Left dorsal great toe looks about the same More substantial area again at the base of his toes on the left which is new this week. Area across the dorsal right toes looks improved The left anterior mid tibia looks like it is trying to close 9/13; 2-week follow-up. Using silver alginate on all of his wounds. The  left dorsal foot does not look any better. He has the area on the dorsal toe and also the areas at the base of all of his toes 1 through 3. On the right foot he has a similar pattern in a similar area. He has the area on his left mid tibia that looks fairly healthy. Finally the area on his left buttock looks somewhat bette 9/20; culture I did of the left foot which was a deep tissue culture last time showed E. coli he has erythema around this wound. Still a completely necrotic surface. His right dorsal foot looks about the same. He has a very friable surface to the left anterior mid tibia. Both buttock wounds look better. We have been using silver alginate to all wounds 10/4; he has completed the cephalexin that I gave him last time for the left foot. He is using topical gentamicin under silver alginate silver alginate being applied to all the wounds. Unfortunately all the wounds look irritated on his dorsal right foot dorsal left foot left mid tibia. I wonder if this could be a silver allergy. I am going to change him to [facility] on the lower extremity. The skin on the left buttock and left posterior thigh still flaking dry and irritated. This has continued no matter what I have applied topically to this. He has a solitary open wound which by itself does not look too bad however the entire area of surrounding skin does not change no matter what we have applied  here 10/18; the area on the left dorsal foot and right dorsal foot both look better. The area on the right extends into the plantar but not between his toes. We have been using silver alginate. He still has a rectangular erythematous area around the area on the left tibia. The wound itself is very small. Finally everything on his left buttock looks a little larger the skin is erythematous 11/15; patient comes in with the left dorsal and right dorsal foot distally looking somewhat better. Still nonviable surface on the left foot which  required debridement. He still has the area on the left anterior mid tibia although this looks somewhat better. He has a new area on the right lateral lower leg just above the ankle. Finally his left buttock looks terrible with multiple superficial open wounds geometric square shaped area of chronic erythema which I have not been able to sort through 11/29; right dorsal foot and left dorsal foot both look somewhat better. No debridement required. He has the fragile area on the left anterior mid tibia this looks and continues to look somewhat better. Right lateral lower leg just above the ankle we identified last time also looks better. In general the area on his left buttock looks improved. We are using Hydrofera Blue to all wound areas 12/13; right dorsal foot looks better. The area on the right lateral leg is healed. Left dorsal foot has 2 open areas both of which require debridement. The fragile area on the left anterior mid tibial looks better. Smaller area on his buttocks. Were using Hydrofera Blue 1/10; patient comes in with everything looking slightly larger and/or worse. This includes his left buttock, reopening of the left mid tibia, larger areas on the left dorsal foot and what looks to be a cellulitis on the right dorsal and plantar foot. We have been using Hydrofera Blue on all wounds. 1/17; right dorsal foot distally looks better today. The left foot has 2 open wounds that are about the same surrounding erythema. 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 No malignancy they did not stain for fungus 1/24; his right distal foot is not closed dry and scaly but the wound looks like it is contracting. I did not debride anything here. Problem on his left dorsal foot with expanding erythema. Apparently there were problems last week getting the Riki Altes however it  is now available at the [employer] [facility] but a week later. He is using ketoconazole and Coloplast to the left buttock along with Hydrofera Blue this actually looks quite a bit better today. 1/31; right dorsal foot again is dry and scaly but looking to contract. He has been using a moisturizer on his feet at my request but he is not sure which 1. The left dorsal foot wounds look about the same there is erythema here that I marked last week however after course of Nuzyra it certainly is not any better but not any worse either. Finally on his left buttock the skin continues to look better he has the original wound but a new substantial area towards the gluteal cleft. Almost like a skin tear. I used scissors to remove skin and subcutaneous tissue here silver nitrate and direct pressure 2/7; right dorsal foot. This does not look too much different from last week. Some erythema skin dry and scaly. No debridement. Left dorsal great toe again still not much improvement. I  did remove flaking dry skin and callus from around the edge. Finally on his left buttock. The skin is somewhat better in the periwound. Surface wounds are superficial somewhat better than last week. 01/26/2022: Is a little bit of a mystery as to why his wounds fail to respond to treatments and actually seem to get worse. This is my first encounter with this patient. He was previously followed by Dr. Leanord Hawking. Based upon my review of the chart, it seems that there is a little bit of a mystery as to why his wounds do not respond as anticipated to the interventions applied and sometimes even get worse. Biopsies have been performed and he was seen by dermatology in [place], but that did not shed any light on the matter. T oday, his gluteal wound is larger, with substantial drainage, rather malodorous. The food wounds are not terrible, but he has a lot of callus and scaly skin around these. He is currently getting silver alginate on the gluteal  wound, with idodoflex to the feet. He is using lotrisone on his legs for the dry, scaly skin. 02/09/2022: There has really been no change to any of his wounds. The gluteal wound less drainage and odor, but remains about the same size, the periwound skin remains oddly scaly. His lower extremity wounds also appear roughly the same size. They continue to accumulate a small amount of slough. The periwound on his feet and ankle wounds has dry eschar and loose dead skin. We have been using silver alginate on the gluteal wound and Iodoflex on his feet and ankle wounds. T the periwound around his gluteal lesion and Lotrisone on his feet and legs. o 02/23/2022: The right plantar foot wound is closed. The gluteal site looks small but has continued to produce hypertrophic granulation tissue. The foot wounds all look about the same on the dorsal surface of the right foot; on the left, there is only a small open area at the site of where his left great toenail would have been. 03/16/2022: The right ankle wound is healed. The right dorsal foot wound is about the same. The left dorsal foot wound is quite a bit smaller and the ischial wound is nearly closed. 03/30/2022: The right ankle wound reopened. Both dorsal foot wounds are quite a bit smaller. Unfortunately, he appears to have sheared part of his ischial wound open further, perhaps during a transfer. 04/13/2022: The right ankle wound has hypertrophic granulation tissue present. The dorsal foot wounds continue to decrease in size. The ischial wound looks about the same today, no better, no worse. 04/27/2022: The right ankle wound has closed. Unfortunately, it looks like some moisture got underneath the dry skin on both of his dorsal feet and these wounds have expanded in size. The ischial wound remains the same with perhaps a little bit more slough accumulation than at our previous visit. 05/11/2022: The right ankle wound remains closed. There is a left anterior tibial  wound that is small has patchy openings with accumulated slough. The dorsum of Tocco, Aseem E ([id number]) [id number] Page 15 of 37 his right foot appears to be nearly healed with just a small punctate opening. The plantar surface of his right foot has a new opening that looks like he may have picked some skin there. His sacral ulcer has hypertrophic granulation tissue but has some slough accumulation. The dorsum of his left foot has multiple open areas in a fairly ragged distribution. All of these have slough accumulated within them. 06/01/2022: The  right ankle and left anterior tibial wound are both closed. Dorsum of his right foot and left foot both look substantially better with just tiny scattered openings The without any slough accumulation. He has sheared open new areas on his left gluteus and ischium. He says that his wheelchair cushion, which is air-filled, has a leak and so it keeps deflating. He is awaiting a new cushion. 06/15/2022: The right ankle wound has reopened and the fat layer is exposed. Both dorsal feet have just small openings with just a little bit of slough and eschar accumulation. The wound on his left gluteus and ischium is larger again today and has a foul odor. 06/29/2022: The right ankle wound has hypertrophic granulation tissue buildup. His dorsal foot wounds both look better with just some eschar on the surface. He has a new wound on his left lateral ankle. He is not sure how he acquired it but by appearance, it looks that he hit it on something, potentially his wheelchair or bed. The ischial wound is about the same but is cleaner without any significant purulent drainage or odor. He did not understand what the [facility] call was about and therefore he does not have the topical compounded antibiotic. 07/13/2022: The right ankle wound again has hypertrophic granulation tissue, but less so than at his previous visit. The ischial wound has  improved tremendously the use of the [facility] topical antibiotic. No significant change to the left lateral ankle wound; it is fibrotic with slough present. The skin of both of his feet, especially on the right, has a yeasty appearance. 08/10/2022: There is again hypertrophic granulation tissue on the right anterior ankle wound. Both feet are about the same. The left lateral ankle wound is a little bit desiccated and has some slough buildup. He unfortunately suffered a new injury when he was removing his pants and they caught his bandage which caused a large skin tear on his left ischium, just distal to the existing wound. The existing left ischial wound, however, is significantly better with just a little light slough on the surface. 08/31/2022: The right anterior ankle wound is a lot smaller today underneath some eschar. No accumulation of hypertrophic granulation tissue. The left lateral ankle wound has some slough on the surface but is better in terms of moisture balance this week. The left dorsal foot does not really have any openings on it today. The right dorsal foot has some slough and eschar accumulation. His gluteal ulcer is basically closed aside from 2 small areas that are oozing a bit. 09/21/2022: The right anterior ankle wound is down to just a tiny pinhole. The left lateral ankle wound has accumulated slough again, but moisture balance is good. Left and right dorsal feet both have 2 small openings with some slough in them. The gluteal ulcer, unfortunately has opened up substantially. 10/05/2022: The right anterior ankle wound has reopened and has a fair amount of slough on the surface. The left lateral ankle wound has accumulated more slough, as well. He was approved for Apligraf, but the wound is not clean enough yet. There is some slough buildup on the dorsal right foot wound, as well as on his ischium. He did not pick up the doxycycline I prescribed for the MRSA that grew out of his  ankle wound culture. 10/26/2022: The right anterior ankle wound has closed again. The left lateral ankle wound looks quite a bit better. It is ready for Apligraf but we do not have 1 here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 be infected or inflamed, very difficult clinical call. 06/21/16: lateral left leg combined wounds. Adherent surface slough area on the left dorsal foot at roughly the fourth toe looks improved 07/12/16; he now has a single linear wound on the lateral left leg. This does not look to be a lot changed from when I lost saw this. The area on his dorsal left foot looks considerably better however. 08/02/16; no major change in the substantial area on his left lateral leg since last time. We have been using Hydrofera Blue for a prolonged period of time now. The area on his left foot is also unchanged from last review 07/19/16; the area on his dorsal foot on the left looks considerably smaller. He is beginning to have significant rims of epithelialization on the lateral left leg wound. This also looks better. 08/05/16; the patient came in for a nurse visit today. Apparently the area on his left  lateral leg looks better and it was wrapped. However in general discussion the patient noted a new area on the dorsal aspect of his right second toe. The exact etiology of this is unclear but likely relates to pressure. 08/09/16 really the area on the left lateral leg did not really look that healthy today perhaps slightly larger and measurements. The area on his dorsal right second toe is improved also the left foot wound looks stable to improved 08/16/16; the area on the last lateral leg did not change any of dimensions. Post debridement with a curet the area looked better. Left foot wound improved and the area on the dorsal right second toe is improved Matusek, Yechezkel E ([id number]) [id number] Page 21 of 37 08/23/16; the area on the left lateral leg may be slightly smaller both in terms of length and width. Aggressive debridement with a curette afterwards the tissue appears healthier. Left foot wound appears improved in the area on the dorsal right second toe is improved 08/30/16 patient developed a fever over the weekend and was seen in an urgent care. Felt to have a UTI and put on doxycycline. He has been since changed over the phone to [facility]. After we took off the wrap on his right leg today the leg is swollen warm and erythematous, probably more  likely the source of the fever 09/06/16; have been using collagen to the major left leg wound, silver alginate to the area on his anterior foot/toes 09/13/16; the areas on his anterior foot/toes on both sides appear to be virtually closed. Extensive wound on the left lateral leg perhaps slightly narrower but each visit still covered an adherent surface slough 09/16/16 patient was in for his usual Thursday nurse visit however the intake nurse noted significant erythema of his dorsal right foot. He is also running a low- grade fever and having increasing spasms in the right leg 09/20/16 here for cellulitis involving his right  great toes and forefoot. This is a lot better. Still requiring debridement on his left lateral leg. Santyl direct says he needs prior authorization. Therefore his wife cannot change this at home 09/30/16; the patient's extensive area on the left lateral calf and ankle perhaps somewhat better. Using Santyl. The area on the left toes is healed and I think the area on his right dorsal foot is healed as well. There is no cellulitis or venous inflammation involving the right leg. He is going to need compression stockings here. 10/07/16; the patient's extensive wound on the left lateral calf and ankle does not measure any differently however there appears to be less adherent surface slough using Santyl and aggressive weekly debridements 10/21/16; no major change in the area on the left lateral calf. Still the same measurement still very difficult to debridement adherent slough and nonviable subcutaneous tissue. This is not really been helped by several weeks of Santyl. Previously for 2 weeks I used Iodoflex for a short period. A prolonged course of Hydrofera Blue didn't really help. I'm not sure why I only used 2 weeks of Iodoflex on this there is no evidence of surrounding infection. He has a small area on the right second toe which looks as though it's progressing towards closure 10/28/16; the wounds on his toes appear to be closed. No major change in the left lateral leg wound although the surface looks somewhat better using Iodoflex. He has had previous arterial studies that were normal. He has had reflux studies and is status post ablation although I don't have any exact notes on which vein was ablated. I'll need to check the surgical record 11/04/16; he's had a reopening between the first and second toe on the left and right. No major change in the left lateral leg wound. There is what appears to be cellulitis of the left dorsal foot 11/18/16 the patient was hospitalized initially in [place] and then  subsequently transferred to [facility] long and was admitted there from 11/09/16 through 11/12/16. He had developed progressive cellulitis on the right leg in spite of the doxycycline I gave him. I'd spoken to the hospitalist in [place] who was concerned about continuing leukocytosis. CT scan is what I suggested this was done which showed soft tissue swelling without evidence of osteomyelitis or an underlying abscess blood cultures were negative. At [facility] he was treated with vancomycin and Primaxin and then add an infectious disease consult. He was transitioned to Ceftaroline. He has been making progressive improvement. Overall a severe cellulitis of the right leg. He is been using silver alginate to her original wound on the left leg. The wounds in his toes on the right are closed there is a small open area on the base of the left second toe 11/26/15; the patient's right leg is much better although there is still some edema here this could be reminiscent from  his severe cellulitis likely on top of some degree of lymphedema. His left anterior leg wound has less surface slough as reported by her intake nurse. Small wound at the base of the left second toe 12/02/16; patient's right leg is better and there is no open wound here. His left anterior lateral leg wound continues to have a healthy-looking surface. Small wound at the base of the left second toe however there is erythema in the left forefoot which is worrisome 12/16/16; is no open wounds on his right leg. We took measurements for stockings. His left anterior lateral leg wound continues to have a healthy-looking surface. I'm not sure where we were with the Apligraf run through his insurance. We have been using Iodoflex. He has a thick eschar on the left first second toe interface, I suspect this may be fungal however there is no visible open 12/23/16; no open wound on his right leg. He has 2 small areas left of the linear wound that was remaining last  week. We have been using Prisma, I thought I have disclosed this week, we can only look forward to next week 01/03/17; the patient had concerning areas of erythema last week, already on doxycycline for UTI through his primary doctor. The erythema is absolutely no better there is warmth and swelling both medially from the left lateral leg wound and also the dorsal left foot. 01/06/17- Patient is here for follow-up evaluation of his left lateral leg ulcer and bilateral feet ulcers. He is on oral antibiotic therapy, tolerating that. Nursing staff and the patient states that the erythema is improved from Monday. 01/13/17; the predominant left lateral leg wound continues to be problematic. I had put Apligraf on him earlier this month once. However he subsequently developed what appeared to be an intense cellulitis around the left lateral leg wound. I gave him Dalvance I think on 2/12 perhaps 2/13 he continues on cefdinir. The erythema is still present but the warmth and swelling is improved. I am hopeful that the cellulitis part of this control. I wouldn't be surprised if there is an element of venous inflammation as well. 01/17/17. The erythema is present but better in the left leg. His left lateral leg wound still does not have a viable surface buttons certain parts of this long thin wound it appears like there has been improvement in dimensions. 01/20/17; the erythema still present but much better in the left leg. I'm thinking this is his usual degree of chronic venous inflammation. The wound on the left leg looks somewhat better. Is less surface slough 01/27/17; erythema is back to the chronic venous inflammation. The wound on the left leg is somewhat better. I am back to the point where I like to try an Apligraf once again 02/10/17; slight improvement in wound dimensions. Apligraf #2. He is completing his doxycycline 02/14/17; patient arrives today having completed doxycycline last Thursday. This was supposed  to be a nurse visit however once again he hasn't tense erythema from the medial part of his wound extending over the lower leg. Also erythema in his foot this is roughly in the same distribution as last time. He has baseline chronic venous inflammation however this is a lot worse than the baseline I have learned to accept the on him is baseline inflammation 02/24/17- patient is here for follow-up evaluation. He is tolerating compression therapy. His voicing no complaints or concerns he is here anticipating an Apligraf 03/03/17; he arrives today with an adherent necrotic surface. I don't think  this is surface is going to be amenable for Apligraf's. The erythema around his wound and on the left dorsal foot has resolved he is off antibiotics 03/10/17; better-looking surface today. I don't think he can tolerate Apligraf's. He tells me he had a wound VAC after a skin graft years ago to this area and they had difficulty with a seal. The erythema continues to be stable around this some degree of chronic venous inflammation but he also has recurrent cellulitis. We have been using Iodoflex 03/17/17; continued improvement in the surface and may be small changes in dimensions. Using Iodoflex which seems the only thing that will control his surface 03/24/17- He is here for follow up evaluation of his LLE lateral ulceration and ulcer to right dorsal foot/toe space. He is voicing no complaints or concerns, He is tolerating compression wrap. 03/31/17 arrives today with a much healthier looking wound on the left lower extremity. We have been using Iodoflex for a prolonged period of time which has for the first time prepared and adequate looking wound bed although we have not had much in the way of wound dimension improvement. He also has a small wound between the first and second toe on the right 04/07/17; arrives today with a healthy-looking wound bed and at least the top 50% of this wound appears to be now her. No  debridement was required I have changed him to [facility] last week after prolonged Iodoflex. He did not do well with Apligraf's. We've had a re-opening between the first and second toe on the right 04/14/17; arrives today with a healthier looking wound bed contractions and the top 50% of this wound and some on the lesser 50%. Wound bed appears healthy. The area between the first and second toe on the right still remains problematic 04/21/17; continued very gradual improvement. Using [facility] 04/28/17; continued very gradual improvement in the left lateral leg venous insufficiency wound. His periwound erythema is very mild. We have been using Hydrofera Blue. Wound is making progress especially in the superior 50% 05/05/17; he continues to have very gradual improvement in the left lateral venous insufficiency wound. Both in terms with an length rings are improving. I debrided this every 2 weeks with #5 curet and we have been using Hydrofera Blue and again making good progress With regards to the wounds between his right first and second toe which I thought might of been tinea pedis he is not making as much progress very dry scaly skin over the area. Also the area at the base of the left first and second toe in a similar condition 05/12/17; continued gradual improvement in the refractory left lateral venous insufficiency wound on the left. Dimension smaller. Surface still requiring debridement using Hydrofera Blue 05/19/17; continued gradual improvement in the refractory left lateral venous ulceration. Careful inspection of the wound bed underlying rumination suggested some degree of epithelialization over the surface no debridement indicated. Continue Hydrofera Blue difficult areas between his toes first and third on the left Duerksen, Tyren E ([id number]) [id number] Page 22 of 37 than first and second on the right. I'm going to change to silver alginate from silver  collagen. Continue ketoconazole as I suspect underlying tinea pedis 05/26/17; left lateral leg venous insufficiency wound. We've been using Hydrofera Blue. I believe that there is expanding epithelialization over the surface of the wound albeit not coming from the wound circumference. This is a bit of an odd situation in which the epithelialization seems to be coming from the  surface of the wound rather than in the exact circumference. There is still small open areas mostly along the lateral margin of the wound. ooHe has unchanged areas between the left first and second and the right first second toes which I been treating for tenia pedis 06/02/17; left lateral leg venous insufficiency wound. We have been using Hydrofera Blue. Somewhat smaller from the wound circumference. The surface of the wound remains a bit on it almost epithelialized sedation in appearance. I use an open curette today debridement in the surface of all of this especially the edges ooSmall open wounds remaining on the dorsal right first and second toe interspace and the plantar left first second toe and her face on the left 06/09/17; wound on the left lateral leg continues to be smaller but very gradual and very dry surface using Hydrofera Blue 06/16/17 requires weekly debridements now on the left lateral leg although this continues to contract. I changed to silver collagen last week because of dryness of the wound bed. Using Iodoflex to the areas on his first and second toes/web space bilaterally 06/24/17; patient with history of paraplegia also chronic venous insufficiency with lymphedema. Has a very difficult wound on the left lateral leg. This has been gradually reducing in terms of with but comes in with a very dry adherent surface. High switch to silver collagen a week or so ago with hydrogel to keep the area moist. This is been refractory to multiple dressing attempts. He also has areas in his first and second toes bilaterally in  the anterior and posterior web space. I had been using Iodoflex here after a prolonged course of silver alginate with ketoconazole was ineffective [question tinea pedis] 07/14/17; patient arrives today with a very difficult adherent material over his left lateral lower leg wound. He also has surrounding erythema and poorly controlled edema. He was switched his Santyl last visit which the nurses are applying once during his doctor visit and once on a nurse visit. He was also reduced to 2 layer compression I'm not exactly sure of the issue here. 07/21/17; better surface today after 1 week of Iodoflex. Significant cellulitis that we treated last week also better. [Doxycycline] 07/28/17 better surface today with now 2 weeks of Iodoflex. Significant cellulitis treated with doxycycline. He has now completed the doxycycline and he is back to his usual degree of chronic venous inflammation/stasis dermatitis. He reminds me he has had ablations surgery here 08/04/17; continued improvement with Iodoflex to the left lateral leg wound in terms of the surface of the wound although the dimensions are better. 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 Problematic areas on the plantar aspect of the first second toe web space on the left and the dorsal aspect of the first second toe web space on the right. At one point I felt these were probably related to chronic fungal infections in treated him aggressively for this although we have not made any improvement here. 08/11/17; left lateral leg. Surface continues to improve with the Iodoflex although we are not seeing much improvement in overall wound dimensions. Areas on his plantar left foot and right foot show no improvement. In fact the right foot looks somewhat worse 08/18/17; left lateral leg. We changed to [facility] Blue last week after a prolonged course of Iodoflex which helps get the surface better.  It appears that the wound with is improved. Continue with difficult areas on the left dorsal first second  and plantar first second on the right 09/01/17; patient arrives in clinic today having had a temperature of 103 yesterday. He was seen in the ER and [facility]. The patient was concerned he could have cellulitis again in the right leg however they diagnosed him with a UTI and he is now on Keflex. He has a history of cellulitis which is been recurrent and difficult but this is been in the left leg, in the past 5 use doxycycline. He does in and out catheterizations at home which are risk factors for UTI 09/08/17; patient will be completing his Keflex this weekend. The erythema on the left leg is considerably better. He has a new wound today on the medial part of the right leg small superficial almost looks like a skin tear. He has worsening of the area on the right dorsal first and second toe. His major area on the left lateral leg is better. Using Hydrofera Blue on all areas 09/15/17; gradual reduction in width on the long wound in the left lateral leg. No debridement required. He also has wounds on the plantar aspect of his left first second toe web space and on the dorsal aspect of the right first second toe web space. 09/22/17; there continues to be very gradual improvements in the dimensions of the left lateral leg wound. He hasn't round erythematous spot with might be pressure on his wheelchair. There is no evidence obviously of infection no purulence no warmth ooHe has a dry scaled area on the plantar aspect of the left first second toe ooImproved area on the dorsal right first second toe. 09/29/17; left lateral leg wound continues to improve in dimensions mostly with an is still a fairly long but increasingly narrow wound. ooHe has a dry scaled area on the plantar aspect of his left first second toe web space ooIncreasingly concerning area on the dorsal right first second toe. In fact I am  concerned today about possible cellulitis around this wound. The areas extending up his second toe and although there is deformities here almost appears to abut on the nailbed. 10/06/17; left lateral leg wound continues to make very gradual progress. Tissue culture I did from the right first second toe dorsal foot last time grew MRSA and enterococcus which was vancomycin sensitive. This was not sensitive to clindamycin or doxycycline. He is allergic to Zyvox and sulfa we have therefore arrange for him to have dalvance infusion tomorrow. He is had this in the past and tolerated it well 10/20/17; left lateral leg wound continues to make decent progress. This is certainly reduced in terms of with there is advancing epithelialization.ooThe cellulitis in the right foot looks better although he still has a deep wound in the dorsal aspect of the first second toe web space. Plantar left first toe web space on the left I think is making some progress 10/27/17; left lateral leg wound continues to make decent progress. Advancing epithelialization.using Hydrofera Blue ooThe right first second toe web space wound is better-looking using silver alginate ooImprovement in the left plantar first second toe web space. Again using silver alginate 11/03/17 left lateral leg wound continues to make decent progress albeit slowly. Using Hydrofera Blue ooThe right per second toe web space continues to be a very problematic looking punched out wound. I obtained a piece of tissue for deep culture I did extensively treated this for fungus. It is difficult to imagine that this is a pressure area as the patient states other than going outside he doesn't  really wear shoes at home ooThe left plantar first second toe web space looked fairly senescent. Necrotic edges. This required debridement oochange to Hydrofera Blue to all wound areas 11/10/17; left lateral leg wound continues to contract. Using Hydrofera Blue ooOn the  right dorsal first second toe web space dorsally. Culture I did of this area last week grew MRSA there is not an easy oral option in this patient was multiple antibiotic allergies or intolerances. This was only a rare culture isolate I'm therefore going to use Bactroban under silver alginate ooOn the left plantar first second toe web space. Debridement is required here. This is also unchanged 11/17/17; left lateral leg wound continues to contract using Hydrofera Blue this is no longer the major issue. ooThe major concern here is the right first second toe web space. He now has an open area going from dorsally to the plantar aspect. There is now wound on the inner lateral part of the first toe. Not a very viable surface on this. There is erythema spreading medially into the forefoot. ooNo major change in the left first second toe plantar wound 11/24/17; left lateral leg wound continues to contract using Hydrofera Blue. Nice improvement today ooThe right first second toe web space all of this looks a lot less angry than last week. I have given him clindamycin and topical Bactroban for MRSA and terbinafine for the possibility of underlining tinea pedis that I could not control with ketoconazole. Looks somewhat better ooThe area on the plantar left first second toe web space is weeping with dried debris around the wound 12/01/17; left lateral leg wound continues to contract he Hydrofera Blue. It is becoming thinner in terms of with nevertheless it is making good improvement. ooThe right first second toe web space looks less angry but still a large necrotic-looking wounds starting on the plantar aspect of the right foot extending between the toes and now extensively on the base of the right second toe. I gave him clindamycin and topical Bactroban for MRSA anterior benefiting for the possibility of underlying tinea pedis. Not looking better today ooThe area on the left first/second toe looks better.  Debrided of necrotic debris 12/05/17* the patient was worked in urgently today because over the weekend he found blood on his incontinence bad when he woke up. He was found to have an ulcer by his wife who does most of his wound care. He came in today for [place] to look at this. He has not had a history of wounds in his buttocks in spite of his paraplegia. 12/08/17; seen in follow-up today at his usual appointment. He was seen earlier this week and found to have a new wound on his buttock. We also follow him for wounds on the left lateral leg, left first second toe web space and right first second toe web space 12/15/17; we have been using Hydrofera Blue to the left lateral leg which has improved. The right first second toe web space has also improved. Left first second toe web space plantar aspect looks stable. The left buttock has worsened using Santyl. Apparently the buttock has drainage 12/22/17; we have been using Hydrofera Blue to the left lateral leg which continues to improve now 2 small wounds separated by normal skin. He tells [place] he had a fever up to 100 yesterday he is prone to UTIs but has not noted anything different. He does in and out catheterizations. The area between the first and second toes today does not look good  necrotic surface covered with what looks to be purulent drainage and erythema extending into the third toe. I had gotten this to something that I thought look better last time however it is not look good today. Schlichting, AZION CENTRELLA ([id number]) [id number] Page 23 of 37 He also has a necrotic surface over the buttock wound which is expanded. I thought there might be infection under here so I removed a lot of the surface with a #5 curet though nothing look like it really needed culturing. He is been using Santyl to this area 12/27/17; his original wound on the left lateral leg continues to improve using Hydrofera Blue. I gave him samples of Baxdella although he  was unable to take them out of fear for an allergic reaction ["lump in his throat"].the culture I did of the purulent drainage from his second toe last week showed both enterococcus and a set Enterobacter I was also concerned about the erythema on the bottom of his foot although paradoxically although this looks somewhat better today. 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 We've been using silver alginate here. They came up today that he sleeps in a recliner, I'm not sure why but I asked him to stop this 01/03/18; his original wound we've been using Hydrofera Blue is now separated into 2 areas. ooUlcer on his left buttock is better he is off the recliner and sleeping in bed ooFinally both wound areas between his first and second toes also looks some better 01/10/18; his original wound on the left lateral leg is now separated into 2 wounds we've been using Hydrofera Blue ooUlcer on his left buttock has some drainage. There is a small probing site going into muscle layer superiorly.using silver alginate -He arrives today with a deep tissue injury on the left heel ooThe wound on the dorsal aspect of his first second toe on the left looks a lot betterusing silver alginate ketoconazole ooThe area on the first second toe web space on the right also looks a lot bette 01/17/18; his original wound on the left lateral leg continues to progress using Hydrofera Blue ooUlcer on his left buttock also is smaller surface healthier except for a small probing site going into the muscle layer superiorly. 2.4 cm of tunneling in this area ooDTI on his left heel we have only been offloading. Looks better than last week no threatened open no evidence of infection oothe wound on the dorsal aspect of the first second toe on the left continues to look like it's regressing we have only been using silver alginate and  terbinafine orally ooThe area in the first second toe web space on the right also looks to be a lot better using silver alginate and terbinafine I think this was prompted by tinea pedis 01/31/18; the patient was hospitalized in [place] last week apparently for a complicated UTI. He was discharged on cefepime he does in and out catheterizations. In the hospital he was discovered M I don't mild elevation of AST and ALT and the terbinafine was stopped.predictably the pressure ulcer on s his buttock looks betterusing silver alginate. The area on the left lateral leg also is better using Hydrofera Blue. The area between the first and second toes on the left better. First and second toes on the right still substantial but better. Finally the DTI on the left heel has held together and looks like it's resolving 02/07/18-he is here  in follow-up evaluation for multiple ulcerations. He has new injury to the lateral aspect of the last issue a pressure ulcer, he states this is from adhesive removal trauma. He states he has tried multiple adhesive products with no success. All other ulcers appear stable. The left heel DTI is resolving. We will continue with same treatment plan and follow-up next week. 02/14/18; follow-up for multiple areas. ooHe has a new area last week on the lateral aspect of his pressure ulcer more over the posterior trochanter. The original pressure ulcer looks quite stable has healthy granulation. 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 Almost fully epithelialized on the original superior area using Hydrofera Blue ooDTI on the left heel has peeled off this week to reveal a small superficial wound under denuded skin and subcutaneous tissue ooBoth areas between the first and second toes look better including nothing open on the left 02/21/18; ooThe patient's wounds on his left  ischial tuberosity and posterior left greater trochanter actually looked better. He has a large area of irritation around the area which I think is contact dermatitis. I am doubtful that this is fungal ooHis original wound on the left lateral calf continues to improve we have been using Hydrofera Blue ooThere is no open area in the left first second toe web space although there is a lot of thick callus ooThe DTI on the left heel required debridement today of necrotic surface eschar and subcutaneous tissue using silver alginate ooFinally the area on the right first second toe webspace continues to contract using silver alginate and ketoconazole 02/28/18 ooLeft ischial tuberosity wounds look better using silver alginate. ooOriginal wound on the left calf only has one small open area left using Hydrofera Blue ooDTI on the left heel required debridement mostly removing skin from around this wound surface. Using silver alginate ooThe areas on the right first/second toe web space using silver alginate and ketoconazole 03/08/18 on evaluation today patient appears to be doing decently well as best I can tell in regard to his wounds. This is the first time that I have seen him as he generally is followed by Dr. Leanord Hawking. With that being said none of his wounds appear to be infected he does have an area where there is some skin covering what appears to be a new wound on the left dorsal surface of his great toe. This is right at the nail bed. With that being said I do believe that debrided away some of the excess skin can be of benefit in this regard. Otherwise he has been tolerating the dressing changes without complication. 03/14/18; patient arrives today with the multiplicity of wounds that we are following. He has not been systemically unwell ooOriginal wound on the left lateral calf now only has 2 small open areas we've been using Hydrofera Blue which should continue ooThe deep tissue injury on the  left heel requires debridement today. We've been using silver alginate ooThe left first second toe and the right first second toe are both are reminiscence what I think was tinea pedis. Apparently some of the callus Surface between the toes was removed last week when it started draining. ooPurulent drainage coming from the wound on the ischial tuberosity on the left. 03/21/18-He is here in follow-up evaluation for multiple wounds. 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 He tolerated debridement. The only change  to last week's recommendations is to discontinue antifungal cream between toes. He will follow-up next week 03/28/18; following up for multiple wounds;Concern this week is streaking redness and swelling in the right foot. He is going to need antibiotics for this. 03/31/18; follow-up for right foot cellulitis. Streaking redness and swelling in the right foot on 03/28/18. 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 I put him on clindamycin 300 mg every 6 and brought him in for a quick check. He has an open wound between his first and second toes on the right foot as a potential source. 04/04/18; ooRight foot cellulitis is resolving he is completing clindamycin. This is truly good news ooLeft lateral calf wound which is initial wound only has one small open area inferiorly this is close to healing out. He has compression stockings. We will use Hydrofera Blue right down to the epithelialization of this ooNonviable surface on the left heel which was initially pressure with a DTI. We've been using Hydrofera Blue. I'm going to switch this back to silver alginate ooLeft first second toe/tinea pedis this looks better using silver alginate ooRight first second toe tinea pedis using silver alginate ooLarge pressure ulcers on theLeft ischial tuberosity. Small wound here Looks better. I am uncertain about  the surface over the large wound. Using silver alginate 04/11/18; ooCellulitis in the right foot is resolved ooLeft lateral calf wound which was his original wounds still has 2 tiny open areas remaining this is just about closed ooNonviable surface on the left heel is better but still requires debridement ooLeft first second toe/tinea pedis still open using silver alginate ooRight first second toe wound tinea pedis I asked him to go back to using ketoconazole and silver alginate ooLarge pressure ulcers on the left ischial tuberosity this shear injury here is resolved. Wound is smaller. No evidence of infection using silver alginate 04/18/18; ooPatient arrives with an intense area of cellulitis in the right mid lower calf extending into the right heel area. Bright red and warm. Smaller area on the left anterior leg. He has a significant history of MRSA. He will definitely need antibioticsoodoxycycline ooHe now has 2 open areas on the left ischial tuberosity the original large wound and now a satellite area which I think was above his initial satellite areas. Not a wonderful surface on this satellite area surrounding erythema which looks like pressure related. ooHis left lateral calf wound again his original wound is just about closed Hawbaker, Suleman E ([id number]) [id number] Page 24 of 37 ooLeft heel pressure injury still requiring debridement ooLeft first second toe looks a lot better using silver alginate ooRight first second toe also using silver alginate and ketoconazole cream also looks better 04/20/18; the patient was worked in early today out of concerns with his cellulitis on the right leg. I had started him on doxycycline. This was 2 days ago. His wife was concerned about the swelling in the area. Also concerned about the left buttock. He has not been systemically unwell no fever chills. No nausea vomiting or diarrhea 04/25/18; the patient's left buttock  wound is continued to deteriorate he is using Hydrofera Blue. He is still completing clindamycin for the cellulitis on the right leg although all of this looks better. 05/02/18 ooLeft buttock wound still with a lot of drainage and a very tightly adherent fibrinous necrotic surface. He has a deeper area superiorly ooThe left lateral calf wound is still closed ooDTI wound on the left heel necrotic surface especially the circumference  using Iodoflex ooAreas between his left first second toe and right first second toe both look better. Dorsally and the right first second toe he had a necrotic surface although at smaller. In using silver alginate and ketoconazole. I did a culture last week which was a deep tissue culture of the reminiscence of the open wound on the right first second toe dorsally. This grew a few Acinetobacter and a few methicillin-resistant staph aureus. Nevertheless the area actually this week looked better. I didn't feel the need to specifically address this at least in terms of systemic antibiotics. 05/09/18; wounds are measuring larger more drainage per our intake. We are using Santyl covered with alginate on the large superficial buttock wounds, Iodosorb on the left heel, ketoconazole and silver alginate to the dorsal first and second toes bilaterally. 05/16/18; ooThe area on his left buttock better in some aspects although the area superiorly over the ischial tuberosity required an extensive debridement.using Santyl ooLeft heel appears stable. Using Iodoflex ooThe areas between his first and second toes are not bad however there is spreading erythema up the dorsal aspect of his left foot this looks like cellulitis again. He is insensate the erythema is really very brilliant.o Erysipelas He went to see an allergist days ago because he was itching part of this he had lab work done. This showed a white count of 15.1 with 70% neutrophils. Hemoglobin of 11.4 and a platelet count  of 659,000. Last white count we had in Epic was a 2-1/2 years ago which was 25.9 but he was ill at the time. He was able to show me some lab work that was done by his primary physician the pattern is about the same. 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 But prompted me to go ahead and do x-rays of both feet and the pelvis rule out osteomyelitis. He also had a comprehensive metabolic panel this was reasonably normal his albumin was 3.7 liver function tests BUN/creatinine all normal 05/23/18; x-rays of both his feet from last week were negative for underlying pulmonary abnormality. The x-ray of his pelvis however showed mild irregularity in the left ischial which may represent some early osteomyelitis. The wound in the left ischial continues to get deeper clearly now exposed muscle. Each week necrotic surface material over this area. Whereas the rest of the wounds do not look so bad. ooThe left ischial wound we have been using Santyl and calcium alginate ooT the left heel surface necrotic debris using Iodoflex o ooThe left lateral leg is still healed ooAreas on the left dorsal foot and the right dorsal foot are about the same. There is some inflammation on the left which might represent contact dermatitis, fungal dermatitis I am doubtful cellulitis although this looks better than last week 05/30/18; CT scan done at Hospital did not show any osteomyelitis or abscess. Suggested the possibility of underlying cellulitis although I don't see a lot of evidence of this at the bedside ooThe wound itself on the left buttock/upper thigh actually looks somewhat better. No debridement ooLeft heel also looks better no debridement continue Iodoflex ooBoth dorsal first second toe spaces appear better using Lotrisone. Left still required debridement 06/06/18; ooIntake reported some purulent looking drainage from the left gluteal wound. Using Santyl and calcium  alginate ooLeft heel looks better although still a nonviable surface requiring debridement ooThe left dorsal foot first/second webspace actually expanding and somewhat deeper. I may consider doing a shave biopsy of  this area ooRight dorsal foot first/second webspace appears stable to improved. Using Lotrisone and silver alginate to both these areas 06/13/18 ooLeft gluteal surface looks better. Now separated in the 2 wounds. No debridement required. Still drainage. We'll continue silver alginate ooLeft heel continues to look better with Iodoflex continue this for at least another week ooOf his dorsal foot wounds the area on the left still has some depth although it looks better than last week. We've been using Lotrisone and silver alginate 06/20/18 ooLeft gluteal continues to look better healthy tissue ooLeft heel continues to look better healthy granulation wound is smaller. He is using Iodoflex and his long as this continues continue the Iodoflex ooDorsal right foot looks better unfortunately dorsal left foot does not. There is swelling and erythema of his forefoot. He had minor trauma to this several days ago but doesn't think this was enough to have caused any tissue injury. Foot looks like cellulitis, we have had this problem before 06/27/18 on evaluation today patient appears to be doing a little worse in regard to his foot ulcer. 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 Both of which would really be his only options for treating this infection. In the past he has been given and effusion of Orbactiv. This is done very well for him in the past again it's one time dosing IV antibiotic therapy. Subsequently I do believe this is something we're gonna need to see about doing at this point in time. Currently his other wounds seem to be doing somewhat  better in my pinion I'm pretty happy in that regard. 07/03/18 on evaluation today patient's wounds actually appear to be doing fairly well. He has been tolerating the dressing changes without complication. All in all he seems to be showing signs of improvement. In regard to the antibiotics he has been dealing with infectious disease since I saw him last week as far as getting this scheduled. In the end [facility]'s [facility] this coming Friday. In the meantime he has been continuing to perform the dressing changes in such as previous. There does not appear to be any evidence of infection worsengin at this time. 07/10/18; ooSince I last saw this man 2 weeks ago things have actually improved. IV antibiotics of resulted in less forefoot erythema although there is still some present. He is not systemically unwell ooLeft buttock wounds o2 now have no depth there is increased epithelialization Using silver alginate ooLeft heel still requires debridement using Iodoflex ooLeft dorsal foot still with a sizable wound about the size of a border but healthy granulation ooRight dorsal foot still with a slitlike area using silver alginate 07/18/18; the patient's cellulitis in the left foot is improved in fact I think it is on its way to resolving. ooLeft buttock wounds o2 both look better although the larger one has hypertension granulation we've been using silver alginate ooLeft heel has some thick circumferential redundant skin over the wound edge which will need to be removed today we've been using Iodoflex ooLeft dorsal foot is still a sizable wound required debridement using silver alginate ooThe right dorsal foot is just about closed only a small open area remains here 07/25/18; left foot cellulitis is resolved ooLeft buttock wounds o2 both look better. Hyper-granulation on the major area ooLeft heel as  some debris over the surface but otherwise  looks a healthier wound. Using silver collagen ooRight dorsal foot is just about closed 07/31/18; arrives with our intake nurse worried about purulent drainage from the buttock. We had hyper-granulation here last week ooHis buttock wounds o2 continue to look better ooLeft heel some debris over the surface but measuring smaller. ooRight dorsal foot unfortunately has openings between the toes ooLeft foot superficial wound looks less aggravated. 08/07/18 ooButtock wounds continue to look better although some of her granulation and the larger medial wound. silver alginate Causby, Damir E ([id number]) [id number] Page 25 of 37 ooLeft heel continues to look a lot better.silver collagen ooLeft foot superficial wound looks less stable. Requires debridement. He has a new wound superficial area on the foot on the lateral dorsal foot. ooRight foot looks better using silver alginate without Lotrisone 08/14/2018; patient was in the ER last week diagnosed with a UTI. He is now on Cefpodoxime and Macrodantin. ooButtock wounds continued to be smaller. Using silver alginate ooLeft heel continues to look better using silver collagen ooLeft foot superficial wound looks as though it is improving ooRight dorsal foot area is just about healed. 08/21/2018; patient is completed his antibiotics for his UTI. ooHe has 2 open areas on the buttocks. There is still not closed although the surface looks satisfactory. Using silver alginate ooLeft heel continues to improve using silver collagen ooThe bilateral dorsal foot areas which are at the base of his first and second toes/possible tinea pedis are actually stable on the left but worse on the right. The area on the left required debridement of necrotic surface. After debridement I obtained a specimen for PCR culture. ooThe right dorsal foot which is been just about healed last week is now reopened 08/28/2018; culture done on the left  dorsal foot showed coag negative staph both staph epidermidis and Lugdunensis. I think this is worthwhile initiating systemic treatment. I will use doxycycline given his long list of allergies. The area on the left heel slightly improved but still requiring debridement. ooThe large wound on the buttock is just about closed whereas the smaller one is larger. Using silver alginate in this area 09/04/2018; patient is completing his doxycycline for the left foot although this continues to be a very difficult wound area with very adherent necrotic debris. We are using silver alginate to all his wounds right foot left foot and the small wounds on his buttock, silver collagen on the left heel. 09/11/2018; once again this patient has intense erythema and swelling of the left forefoot. Lesser degrees of erythema in the right foot. He has a long list of allergies and intolerances. I will reinstitute doxycycline. 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 Using silver alginate ooLeft heel also looks better using silver collagen ooUnfortunately both the areas on his feet look worse. The area on the left first second webspace is now gone through to the plantar part of his foot. The area on the left foot anteriorly is irritated with erythema and swelling in the forefoot. 09/25/2018 ooHis wound on the left plantar heel looks better. Using silver collagen ooThe area on the left buttock 2 small remnant areas. One is closed one is still open. Using silver alginate ooThe areas between both his first and second toes look worse. This in spite of long-standing antifungal therapy with ketoconazole and silver alginate which should have antifungal activity ooHe has small areas around his original wound on  the left calf one is on the bottom of the original scar tissue and one superiorly both of these are small and superficial but again given wound history in this site  this is worrisome 10/02/2018 ooLeft plantar heel continues to gradually contract using silver collagen ooLeft buttock wound is unchanged using silver alginate ooThe areas on his dorsal feet between his first and second toes bilaterally look about the same. 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 Small open area here. We have been using silver alginate 10/09/2018; ooLeft plantar heel continues to look healthy and contract. No debridement is required ooLeft buttock slightly smaller with a tape injury wound just below which was new this week ooDorsal feet somewhat improved I have been using clindamycin ooLeft lateral looks lower extremity the actual open area looks worse although a lot of this is epithelialized. I am going to change to silver collagen today He has a lot more swelling in the right leg although this is not pitting not red and not particularly warm there is a lot of spasm in the right leg usually indicative of people with paralysis of some underlying discomfort. We have reviewed his vascular status from 2017 he had a left greater saphenous vein ablation. I wonder about referring him back to vascular surgery if the area on the left leg continues to deteriorate. 10/16/2018 in today for follow-up and management of multiple lower extremity ulcers. His left Buttock wound is much lower smaller and almost closed completely. The wound to the left ankle has began to reopen with Epithelialization and some adherent slough. He has multiple new areas to the left foot and leg. The left dorsal foot without much improvement. Wound present between left great webspace and 2nd toe. Erythema and edema present right leg. Right LE ultrasound obtained on 10/10/18 was negative for DVT . 10/23/2018; ooLeft buttock is  closed over. Still dry macerated skin but there is no open wound. I suspect this is chronic pressure/moisture ooLeft lateral calf is quite a bit worse than when I saw this last. There is clearly drainage here he has macerated skin into the left plantar heel. We will change the primary dressing to alginate ooLeft dorsal foot has some improvement in overall wound area. Still using clindamycin and silver alginate ooRight dorsal foot about the same as the left using clindamycin and silver alginate ooThe erythema in the right leg has resolved. He is DVT rule out was negative ooLeft heel pressure area required debridement although the wound is smaller and the surface is health 10/26/2018 ooThe patient came back in for his nurse check today predominantly because of the drainage coming out of the left lateral leg with a recent reopening of his original wound on the left lateral calf. He comes in today with a large amount of surrounding erythema around the wound extending from the calf into the ankle and even in the area on the dorsal foot. He is not systemically unwell. He is not febrile. Nevertheless this looks like cellulitis. We have been using silver alginate to the area. I changed him to a regular visit and I am going to prescribe him doxycycline. The rationale here is a long list of medication intolerances and a history of MRSA. I did not see anything that I thought would provide a valuable culture 10/30/2018 ooFollow-up from his appointment 4  days ago with really an extensive area of cellulitis in the left calf left lateral ankle and left dorsal foot. I put him on doxycycline. He has a long list of medication allergies which are true allergy reactions. 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 In any case he is a lot better today. The erythema especially in the anterior and lateral left calf is better. He still has left ankle  erythema. He also is complaining about increasing edema in the right leg we have only been using Kerlix Coban and he has been doing the wraps at home. Finally he has a spotty rash on the medial part of his upper left calf which looks like folliculitis or perhaps wrap occlusion type injury. Small superficial macules not pustules 11/06/18 patient arrives today with again a considerable degree of erythema around the wound on the left lateral calf extending into the dorsal ankle and dorsal foot. This is a lot worse than when I saw this last week. He is on doxycycline really with not a lot of improvement. He has not been systemically unwell Wounds on the; left heel actually looks improved. Original area on the left foot and proximity to the first and second toes looks about the same. He has superficial areas on the dorsal foot, anterior calf and then the reopening of his original wound on the left lateral calf which looks about the same ooThe only area he has on the right is the dorsal webspace first and second which is smaller. ooHe has a large area of dry erythematous skin on the left buttock small open area here. 11/13/2018; the patient arrives in much better condition. The erythema around the wound on the left lateral calf is a lot better. Not sure whether this was the clindamycin or the TCA and ketoconazole or just in the improvement in edema control [stasis dermatitis]. In any case this is a lot better. The area on the left heel is very small and just about resolved using silver collagen we have been using silver alginate to the areas on his dorsal feet 11/20/2018; his wounds include the left lateral calf, left heel, dorsal aspects of both feet just proximal to the first second webspace. He is stable to slightly improved. I did not think any changes to his dressings were going to be necessary 11/27/2018 he has a reopening on the left buttock which is surrounded by what looks like tinea or perhaps some  other form of dermatitis. The area on the left dorsal foot has some erythema around it I have marked this area but I am not sure whether this is cellulitis or not. Left heel is not closed. Left calf the Benning, Paige E ([id number]) [id number] Page 26 of 37 reopening is really slightly longer and probably worse 1/13; in general things look better and smaller except for the left dorsal foot. Area on the left heel is just about closed, left buttock looks better only a small wound remains in the skin looks better [using Lotrisone] 1/20; the area on the left heel only has a few remaining open areas here. Left lateral calf about the same in terms of size, left dorsal foot slightly larger right lateral foot still not closed. The area on the left buttock has no open wound and the surrounding skin looks a lot better 1/27; the area on the left heel is closed. Left lateral calf better but still requiring extensive debridements. The area  on his left buttock is closed. He still has the open areas on the left dorsal foot which is slightly smaller in the right foot which is slightly expanded. We have been using Iodoflex on these areas as well 2/3; left heel is closed. Left lateral calf still requiring debridement using Iodoflex there is no open area on his left buttock however he has dry scaly skin over a large area of this. Not really responding well to the Lotrisone. Finally the areas on his dorsal feet at the level of the first second webspace are slightly smaller on the right and about the same on the left. Both of these vigorously debrided with Anasept and gauze 2/10; left heel remains closed he has dry erythematous skin over the left buttock but there is no open wound here. Left lateral leg has come in and with. Still requiring debridement we have been using Iodoflex here. Finally the area on the left dorsal foot and right dorsal foot are really about the same extremely dry callused  fissured areas. He does not yet have a dermatology appointment 2/17; left heel remains closed. He has a new open area on the left buttock. The area on the left lateral calf is bigger longer and still covered in necrotic debris. No major change in his foot areas bilaterally. I am awaiting for a dermatologist to look on this. We have been using ketoconazole I do not know that this is been doing any good at all. 2/24; left heel remains closed. The left buttock wound that was new reopening last week looks better. The left lateral calf appears better also although still requires debridement. The major area on his foot is the left first second also requiring debridement. We have been putting Prisma on all wounds. I do not believe that the ketoconazole has done too much good for his feet. He will use Lotrisone I am going to give him a 2-week course of terbinafine. We still do not have a dermatology appointment 3/2 left heel remains closed however there is skin over bone in this area I pointed this out to him today. The left buttock wound is epithelialized but still does not look completely stable. The area on the left leg required debridement were using silver collagen here. With regards to his feet we changed to Lotrisone last week and silver alginate. 3/9; left heel remains closed. Left buttock remains closed. The area on the right foot is essentially closed. The left foot remains unchanged. Slightly smaller on the left lateral calf. Using silver collagen to both of these areas 3/16-Left heel remains closed. Area on right foot is closed. Left lateral calf above the lateral malleolus open wound requiring debridement with easy bleeding. Left dorsal wound proximal to first toe also debrided. Left ischial area open new. Patient has been using Prisma with wrapping every 3 days. Dermatology appointment is apparently tomorrow.Patient has completed his terbinafine 2-week course with some apparent improvement  according to him, there is still flaking and dry skin in his foot on the left 3/23; area on the right foot is reopened. The area on the left anterior foot is about the same still a very necrotic adherent surface. He still has the area on the left leg and reopening is on the left buttock. He apparently saw dermatology although I do not have a note. According to the patient who is usually fairly well informed they did not have any good ideas. Put him on oral terbinafine which she is been on before.  3/30; using silver collagen to all wounds. Apparently his dermatologist put him on doxycycline and rifampin presumably some culture grew staph. I do not have this result. He remains on terbinafine although I have used terbinafine on him before 4/6; patient has had a fairly substantial reopening on the right foot between the first and second toes. He is finished his terbinafine and I believe is on doxycycline and rifampin still as prescribed by dermatology. We have been using silver collagen to all his wounds although the patient reports that he thinks silver alginate does better on the wounds on his buttock. 4/13; the area on his left lateral calf about the same size but it did not require debridement. ooLeft dorsal foot just proximal to the webspace between the first and second toes is about the same. Still nonviable surface. I note some superficial bronze discoloration of the dorsal part of his foot ooRight dorsal foot just proximal to the first and second toes also looks about the same. I still think there may be the same discoloration I noted above on the left ooLeft buttock wound looks about the same 4/20; left lateral calf appears to be gradually contracting using silver collagen. 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 The left dorsal foot wound is debrided of tightly adherent necrotic debris and really cleans up quite nicely. The  right area is worse with expansion. I did not debride this it is now over the base of the second toe ooThe area on his left buttock is smaller no debridement is required using silver collagen 5/4; left calf continues to make good progress. ooHe arrives with erythema around the wounds on his dorsal foot which even extends to the plantar aspect. Very concerning for coexistent infection. He is finished the erythromycin I gave him for possible erythrasma this does not seem to have helped. ooThe area on the left foot is about the same base of the dorsal toes ooIs area on the buttock looks improved on the left 5/11; left calf and left buttock continued to make good progress. Left foot is about the same to slightly improved. ooMajor problem is on the right foot. He has not had an x-ray. Deep tissue culture I did last week showed both Enterobacter and E. coli. I did not change the doxycycline I put him on empirically although neither 1 of these were plated to doxycycline. He arrives today with the erythema looking worse on both the dorsal and plantar foot. Macerated skin on the bottom of the foot. he has not been systemically unwell 5/18-Patient returns at 1 week, left calf wound appears to be making some progress, left buttock wound appears slightly worse than last time, left foot wound looks slightly better, right foot redness is marginally better. X-ray of both feet show no air or evidence of osteomyelitis. Patient is finished his Omnicef and terbinafine. He continues to have macerated skin on the bottom of the left foot as well as right 5/26; left calf wound is better, left buttock wound appears to have multiple small superficial open areas with surrounding macerated skin. X-rays that I did last time showed no evidence of osteomyelitis in either foot. He is finished cefdinir and doxycycline. I do not think that he was on terbinafine. He continues to have a large superficial open area on the right  foot anterior dorsal and slightly between the first and second toes. I did send him to dermatology 2 months ago or so wondering about whether  they would do a fungal scraping. I do not believe they did but did do a culture. We have been using silver alginate to the toe areas, he has been using antifungals at home topically either ketoconazole or Lotrisone. We are using silver collagen on the left foot, silver alginate on the right, silver collagen on the left lateral leg and silver alginate on the left buttock 6/1; left buttock area is healed. We have the left dorsal foot, left lateral leg and right dorsal foot. We are using silver alginate to the areas on both feet and silver collagen to the area on his left lateral calf 6/8; the left buttock apparently reopened late last week. He is not really sure how this happened. He is tolerating the terbinafine. Using silver alginate to all wounds 6/15; left buttock wound is larger than last week but still superficial. ooCame in the clinic today with a report of purulence from the left lateral leg I did not identify any infection ooBoth areas on his dorsal feet appear to be better. He is tolerating the terbinafine. Using silver alginate to all wounds 6/22; left buttock is about the same this week, left calf quite a bit better. His left foot is about the same however he comes in with erythema and warmth in the right forefoot once again. Culture that I gave him in the beginning of May showed Enterobacter and E. coli. I gave him doxycycline and things seem to improve although neither 1 of these organisms was specifically plated. 6/29; left buttock is larger and dry this week. Left lateral calf looks to me to be improved. Left dorsal foot also somewhat improved right foot completely unchanged. The erythema on the right foot is still present. He is completing the Ceftin dinner that I gave him empirically [see discussion above.) 7/6 - All wounds look to be stable  and perhaps improved, the left buttock wound is slightly smaller, per patient bleeds easily, completed ceftin, the right foot redness is less, he is on terbinafine 7/13; left buttock wound about the same perhaps slightly narrower. Area on the left lateral leg continues to narrow. Left dorsal foot slightly smaller right foot about the same. We are using silver alginate on the right foot and Hydrofera Blue to the areas on the left. Unna boot on the left 2 layer compression on the right 7/20; left buttock wound absolutely the same. Area on lateral leg continues to get better. Left dorsal foot require debridement as did the right no major change in the 7/27; left buttock wound the same size necrotic debris over the surface. The area on the lateral leg is closed once again. His left foot looks better right foot about the same although there is some involvement now of the posterior first second toe area. He is still on terbinafine which I have given him for a month, not certain a centimeter major change Chavira, Jawara E ([id number]) [id number] Page 27 of 37 06/25/19-All wounds appear to be slightly improved according to report, left buttock wound looks clean, both foot wounds have minimal to no debris the right dorsal foot has minimal slough. We are using Hydrofera Blue to the left and silver alginate to the right foot and ischial wound. 8/10-Wounds all appear to be around the same, the right forefoot distal part has some redness which was not there before, however the wound looks clean and small. Ischial wound looks about the same with no changes 8/17; his wound on the left lateral calf which was  his original chronic venous insufficiency wound remains closed. Since I last saw him the areas on the left dorsal foot right dorsal foot generally appear better but require debridement. The area on his left initial tuberosity appears somewhat larger to me perhaps hyper granulated and  bleeds very easily. We have been using Hydrofera Blue to the left dorsal foot and silver alginate to everything else 8/24; left lateral calf remains closed. The areas on his dorsal feet on the webspace of the first and second toes bilaterally both look better. The area on the left buttock which is the pressure ulcer stage II slightly smaller. I change the dressing to Hydrofera Blue to all areas 8/31; left lateral calf remains closed. The area on his dorsal feet bilaterally look better. Using Hydrofera Blue. Still requiring debridement on the left foot. No change in the left buttock pressure ulcers however 9/14; left lateral calf remains closed. Dorsal feet look quite a bit better than 2 weeks ago. Flaking dry skin also a lot better with the ammonium lactate I gave him 2 weeks ago. The area on the left buttock is improved. He states that his Roho cushion developed a leak and he is getting a new one, in the interim he is offloading this vigorously 9/21; left calf remains closed. Left heel which was a possible DTI looks better this week. He had macerated tissue around the left dorsal foot right foot looks satisfactory and improved left buttock wound. I changed his dressings to his feet to silver alginate bilaterally. Continuing Hydrofera Blue on the left buttock. 9/28 left calf remains closed. Left heel did not develop anything [possible DTI] dry flaking skin on the left dorsal foot. Right foot looks satisfactory. Improved left buttock wound. We are using silver alginate on his feet Hydrofera Blue on the buttock. I have asked him to go back to the Lotrisone on his feet including the wounds and surrounding areas 10/5; left calf remains closed. The areas on the left and right feet about the same. A lot of this is epithelialized however debris over the remaining open areas. He is using Lotrisone and silver alginate. The area on the left buttock using Hydrofera Blue 10/26. Patient has been out for 3 weeks  secondary to Covid concerns. He tested negative but I think his wife tested positive. He comes in today with the left foot substantially worse, right foot about the same. Even more concerning he states that the area on his left buttock closed over but then reopened and is considerably deeper in one aspect than it was before [stage III wound] 11/2; left foot really about the same as last week. Quarter sized wound on the dorsal foot just proximal to the first second toes. Surrounding erythema with areas of denuded epithelium. This is not really much different looking. Did not look like cellulitis this time however. ooRight foot area about the same.. We have been using silver alginate alginate on his toes ooLeft buttock still substantial irritated skin around the wound which I think looks somewhat better. We have been using Hydrofera Blue here. 11/9; left foot larger than last week and a very necrotic surface. Right foot I think is about the same perhaps slightly smaller. Debris around the circumference also addressed. Unfortunately on the left buttock there is been a decline. Satellite lesions below the major wound distally and now a an additional one posteriorly we have been using Hydrofera Blue but I think this is a pressure issue 11/16; left foot ulcer dorsally again  a very adherent necrotic surface. Right foot is about the same. Not much change in the pressure ulcer on his left buttock. 11/30; left foot ulcer dorsally basically the same as when I saw him 2 weeks ago. Very adherent fibrinous debris on the wound surface. Patient reports a lot of drainage as well. 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 We have been using Iodoflex, patient changed back to alginate because of the drainage. Area on his right dorsal foot really looks benign with a healthier surface certainly a lot better than on the left. Left buttock wounds all improved using  Hydrofera Blue 12/7; left dorsal foot again no improvement. Tightly adherent debris. PCR culture I did last week only showed likely skin contaminant. I have gone ahead and done a punch biopsy of this which is about the last thing in terms of investigations I can think to do. He has known venous insufficiency and venous hypertension and this could be the issue here. The area on the right foot is about the same left buttock slightly worse according to our intake nurse secondary to [facility] Blue sticking to the wound 12/14; biopsy of the left foot that I did last time showed changes that could be related to wound healing/chronic stasis dermatitis phenomenon no neoplasm. We have been using silver alginate to both feet. I change the one on the left today to Sorbact and silver alginate to his other 2 wounds 12/28; the patient arrives with the following problems; ooMajor issue is the dorsal left foot which continues to be a larger deeper wound area. Still with a completely nonviable surface ooParadoxically the area mirror image on the right on the right dorsal foot appears to be getting better. ooHe had some loss of dry denuded skin from the lower part of his original wound on the left lateral calf. Some of this area looked a little vulnerable and for this reason we put him in wrap that on this side this week ooThe area on his left buttock is larger. He still has the erythematous circular area which I think is a combination of pressure, sweat. This does not look like cellulitis or fungal dermatitis 11/26/2019; -Dorsal left foot large open wound with depth. Still debris over the surface. Using Sorbact ooThe area on the dorsal right foot paradoxically has closed over [facility] has a reopening on the left ankle laterally at the base of his original wound that extended up into the calf. This appears clean. ooThe left buttock wound is smaller but with very adherent necrotic debris over the surface. We have  been using silver alginate here as well The patient had arterial studies done in 2017. He had biphasic waveforms at the dorsalis pedis and posterior tibial bilaterally. ABI in the left was 1.17. Digit waveforms were dampened. He has slight spasticity in the great toes I do not think a TBI would be possible 1/11; the patient comes in today with a sizable reopening between the first and second toes on the right. This is not exactly in the same location where we have been treating wounds previously. According to our intake nurse this was actually fairly deep but 0.6 cm. The area on the left dorsal foot looks about the same the surface is somewhat cleaner using Sorbact, his MRI is in 2 days. We have not managed yet to get arterial studies. The new reopening on the left lateral calf looks somewhat better using alginate. The left buttock wound is  about the same using alginate 1/18; the patient had his ARTERIAL studies which were quite normal. ABI in the right at 1.13 with triphasic/biphasic waveforms on the left ABI 1.06 again with triphasic/biphasic waveforms. It would not have been possible to have done a toe brachial index because of spasticity. We have been using Sorbac to the left foot alginate to the rest of his wounds on the right foot left lateral calf and left buttock 1/25; arrives in clinic with erythema and swelling of the left forefoot worse over the first MTP area. This extends laterally dorsally and but also posteriorly. Still has an area on the left lateral part of the lower part of his calf wound it is eschared and clearly not closed. ooArea on the left buttock still with surrounding irritation and erythema. ooRight foot surface wound dorsally. The area between the right and first and second toes appears better. 2/1; ooThe left foot wound is about the same. Erythema slightly better I gave him a week of doxycycline empirically ooRight foot wound is more extensive extending between the  toes to the plantar surface ooLeft lateral calf really no open surface on the inferior part of his original wound however the entire area still looks vulnerable ooAbsolutely no improvement in the left buttock wound required debridement. 2/8; the left foot is about the same. Erythema is slightly improved I gave him clindamycin last week. ooRight foot looks better he is using Lotrimin and silver alginate ooHe has a breakdown in the left lateral calf. Denuded epithelium which I have removed ooLeft buttock about the same were using Hydrofera Blue 2/15; left foot is about the same there is less surrounding erythema. Surface still has tightly adherent debris which I have debriding however not making any progress ooRight foot has a substantial wound on the medial right second toe between the first and second webspace. ooStill an open area on the left lateral calf distal area. ooButtock wound is about the same 2/22; left foot is about the same less surrounding erythema. Surface has adherent debris. Polymen Ag Right foot area significant wound between the first and second toes. We have been using silver alginate here Left lateral leg polymen Ag at the base of his original venous insufficiency wound ooLeft buttock some improvement here Kahl, Dejean E ([id number]) [id number] Page 28 of 37 3/1; ooRight foot is deteriorating in the first second toe webspace. Larger and more substantial. We have been using silver alginate. ooLeft dorsal foot about the same markedly adherent surface debris using PolyMem Ag ooLeft lateral calf surface debris using PolyMem AG ooLeft buttock is improved again using PolyMem Ag. ooHe is completing his terbinafine. The erythema in the foot seems better. He has been on this for 2 weeks 3/8; no improvement in any wound area in fact he has a small open area on the dorsal midfoot which is new this week. He has not gotten his foot x-rays  yet 3/15; his x-rays were both negative for osteomyelitis of both feet. No major change in any of his wounds on the extremities however his buttock wounds are better. We have been using polymen on the buttocks, left lower leg. Iodoflex on the left foot and silver alginate on the right 3/22; arrives in clinic today with the 2 major issues are the improvement in the left dorsal foot wound which for once actually looks healthy with a nice healthy wound surface without debridement. Using Iodoflex here. Unfortunately on the left lateral calf which is in the distal part of  his original wound he came to the clinic here for there was purulent drainage noted some increased breakdown scattered around the original area and a small area proximally. We we are using polymen here will change to silver alginate today. His buttock wound on the left is better and I think the area on the right first second toe webspace is also improved 3/29; left dorsal foot looks better. Using Iodoflex. Left ankle culture from deterioration last time grew E. coli, Enterobacter and Enterococcus. I will give him a course of cefdinir although that will not cover Enterococcus. The area on the right foot in the webspace of the first and second toe lateral first toe looks better. The area on his buttock is about healed Vascular appointment is on April 21. This is to look at his venous system vis--vis continued breakdown of the wounds on the left including the left lateral leg and left dorsal foot he. He has had previous ablations on this side 4/5; the area between the right first and second toes lateral aspect of the first toe looks better. Dorsal aspect of the left first toe on the left foot also improved. Unfortunately the left lateral lower leg is larger and there is a second satellite wound superiorly. The usual superficial abrasions on the left buttock overall better but certainly not closed 4/12; the area between the right first and  second toes is improved. Dorsal aspect of the left foot also slightly smaller with a vibrant healthy looking surface. No real change in the left lateral leg and the left buttock wound is healed He has an unaffordable co-pay for Apligraf. Appointment with vein and vascular with regards to the left leg venous part of the circulation is on 4/21 4/19; we continue to see improvement in all wound areas. Although this is minor. He has his vascular appointment on 4/21. The area on the left buttock has not reopened although right in the center of this area the skin looks somewhat threatened 4/26; the left buttock is unfortunately reopened. In general his left dorsal foot has a healthy surface and looks somewhat smaller although it was not measured as such. The area between his first and second toe webspace on the right as a small wound against the first toe. The patient saw vascular surgery. The real question I was asking was about the small saphenous vein on the left. He has previously ablated left greater saphenous vein. Nothing further was commented on on the left. Right greater saphenous vein without reflux at the saphenofemoral junction or proximal thigh there was no indication for ablation of the right greater saphenous vein duplex was negative for DVT bilaterally. They did not think there was anything from a vascular surgery point of view that could be offered. They ABIs within normal limits 5/3; only small open area on the left buttock. The area on the left lateral leg which was his original venous reflux is now 2 wounds both which look clean. We are using Iodoflex on the left dorsal foot which looks healthy and smaller. He is down to a very tiny area between the right first and second toes, using silver alginate 5/10; all of his wounds appear better. We have much better edema control in 4 layer compression on the left. This may be the factor that is allowing the left foot and left lateral calf to  heal. He has external compression garments at home 04/14/20-All of his wounds are progressing well, the left forefoot is practically closed, left ischium  appears to be about the same, right toe webspace is also smaller. The left lateral leg is about the same, continue using Hydrofera Blue to this, silver alginate to the ischium, Iodoflex to the toe space on the right 6/7; most of his wounds outside of the left buttock are doing well. The area on the left lateral calf and left dorsal foot are smaller. The area on the right foot in between the first and second toe webspace is barely visible although he still says there is some drainage here is the only reason I did not heal this out. ooUnfortunately the area on the left buttock almost looks like he has a skin tear from tape. He has open wound and then a large flap of skin that we are trying to get adherence over an area just next to the remaining wound 6/21; 2 week follow-up. I believe is been here for nurse visits. Miraculously the area between his first and second toes on the left dorsal foot is closed over. Still open on the right first second web space. The left lateral calf has 2 open areas. Distally this is more superficial. The proximal area had a little more depth and required debridement of adherent necrotic material. His buttock wound is actually larger we have been using silver alginate here 6/28; the patient's area on the left foot remains closed. Still open wet area between the first and second toes on the right and also extending into the plantar aspect. We have been using silver alginate in this location. He has 2 areas on the left lower leg part of his original long wounds which I think are better. We have been using Hydrofera Blue here. Hydrofera Blue to the left buttock which is stable 7/12; left foot remains closed. Left ankle is closed. May be a small area between his right first and second toes the only truly open area is on the left  buttock. We have been using Hydrofera Blue here 7/19; patient arrives with marked deterioration especially in the left foot and ankle. We did not put him in a compression wrap on the left last week in fact he wore his juxta lite stockings on either side although he does not have an underlying stocking. He has a reopening on the left dorsal foot, left lateral ankle and a new area on the right dorsal ankle. More worrisome is the degree of erythema on the left foot extending on the lateral foot into the lateral lower leg on the left 7/26; the patient had erythema and drainage from the lateral left ankle last week. Culture of this grew MRSA resistant to doxycycline and clindamycin which are the 2 antibiotics we usually use with this patient who has multiple antibiotic allergies including linezolid, trimethoprim sulfamethoxazole. I had give him an empiric doxycycline and he comes in the area certainly looks somewhat better although it is blotchy in his lower leg. He has not been systemically unwell. He has had areas on the left dorsal foot which is a reopening, chronic wounds on the left lateral ankle. Both of these I think are secondary to chronic venous insufficiency. The area between his first and second toes is closed as far as I can tell. He had a new wrap injury on the right dorsal ankle last week. Finally he has an area on the left buttock. We have been using silver alginate to everything except the left buttock we are using Hydrofera Blue 06/30/20-Patient returns at 1 week, has been given a sample  dose pack of NUZYRA which is a tetracycline derivative [omadacycline], patient has completed those, we have been using silver alginate to almost all the wounds except the left ischium where we are using Hydrofera Blue all of them look better 8/16; since I last saw the patient he has been doing well. The area on the left buttock, left lateral ankle and left foot are all closed today. He has completed  the [place] I gave him last time and tolerated this well. He still has open areas on the right dorsal ankle and in the right first second toe area which we are using silver alginate. 8/23; we put him in his bilateral external compression stockings last week as he did not have anything open on either leg except for concerning area between the right first and second toe. He comes in today with an area on the left dorsal foot slightly more proximal than the original wound, the left lateral foot but this is actually a continuation of the area he had on the left lateral ankle from last time. As well he is opened up on the left buttock again. 8/30; comes in today with things looking a lot better. The area on the left lower ankle has closed down as has the left foot but with eschar in both areas. The area on the dorsal right ankle is also epithelialized. Very little remaining of the left buttock wound. We have been using silver alginate on all wound areas 9/13; the area in the first second toe webspace on the right has fully epithelialized. He still has some vulnerable epithelium on the right and the ankle and the dorsal foot. He notes weeping. He is using his juxta lite stocking. On the left again the left dorsal foot is closed left lateral ankle is closed. We went to the juxta lite stocking here as well. ooStill vulnerable in the left buttock although only 2 small open areas remain here 9/27; 2-week follow-up. We did not look at his left leg but the patient says everything is closed. He is a bit disturbed by the amount of edema in his left foot he is using juxta lite stockings but asking about over the toes stockings which would be 30/40, will talk to him next time. According to him there is no open wound on either the left foot or the left ankle/calf He has an open area on the dorsal right calf which I initially point a wrap injury. He has superficial remaining wound on the left ischial tuberosity been  using silver alginate although he says this sticks to the wound 10/5; we gave him 2-week follow-up but he called yesterday expressing some concerns about his right foot right ankle and the left buttock. He came in early. There is still no open areas on the left leg and that still in his juxta lite stocking 10/11; he only has 1 small area on the left buttock that remains measuring millimeters 1 mm. Still has the same irritated skin in this area. We recommended zinc oxide when this eventually closes and pressure relief is meticulously is he can do this. He still has an area on the dorsal part of his right first through third toes which is a bit irritated and still open and on the dorsal ankle near the crease of the ankle. We have been using silver alginate and using his own stocking. He has nothing open on the left leg or foot Lehmkuhl, Mordechai E ([id number]) [id number] Page 29 of 37 10/25; 2-week  follow-up. Not nearly as good on the left buttock as I was hoping. For open areas with 5 looking threatened small. He has the erythematous irritated chronic skin in this area. oo1 area on the right dorsal ankle. He reports this area bleeds easily ooRight dorsal foot just proximal to the base of his toes ooWe have been using silver alginate. 11/8; 2-week follow-up. Left buttock is about the same although I do not think the wounds are in the same location we have been using silver alginate. I have asked him to use zinc oxide on the skin around the wounds. ooHe still has a small area on the right dorsal ankle he reports this bleeds easily ooRight dorsal foot just proximal to the base of the toes does not have anything open although the skin is very dry and scaly ooHe has a new opening on the nailbed of the left great toe. Nothing on the left ankle 11/29; 3-week follow-up. Left buttock has 2 open areas. And washing of these wounds today started bleeding easily. Suggesting very  friable tissue. We have been using silver alginate. Right dorsal ankle which I thought was initially a wrap injury we have been using silver alginate. Nothing open between the toes that I can see. He states the area on the left dorsal toe nailbed healed after the last visit in 2 or 3 days 12/13; 3-week follow-up. His left buttock now has 3 open areas but the original 2 areas are smaller using polymen here. Surrounding skin looks better. The right dorsal ankle is closed. He has a small opening on the right dorsal foot at the level of the third toe. In general the skin looks better here. He is wearing his juxta lite stocking on the left leg says there is nothing open 11/24/2020; 3 weeks follow-up. His left buttock still has the 3 open areas. We have been using polymen but due to lack of response he changed to [facility] area. Surrounding skin is dry erythematous and irritated looking. There is no evidence of infection either bacterial or fungal however there is loss of surface epithelium ooHe still has very dry skin in his foot causing irritation and erythema on the dorsal part of his toes. This is not responded to prolonged courses of antifungal simply looks dry and irritated 1/24; left buttock area still looks about the same he was unable to find the triad ointment that we had suggested. The area on the right lower leg just above the dorsal ankle has reopened and the areas on the right foot between the first second and second third toes and scaling on the bottom of the foot has been about the same for quite some time now. been using silver alginate to all wound areas 2/7; left buttock wound looked quite good although not much smaller in terms of surface area surrounding skin looks better. Only a few dry flaking areas on the right foot in between the first and second toes the skin generally looks better here [ammonium lactate]. Finally the area on the right dorsal ankle is closed 2/21; ooThere is  no open area on the right foot even between the right first and second toe. Skin around this area dorsally and plantar aspects look better. ooHe has a reopening of the area on the right ankle just above the crease of the ankle dorsally. I continue to think that this is probably friction from spasms may be even this time with his stocking under the compression stockings. ooWounds on his  left buttock look about the same there a couple of areas that have reopened. He has a total square area of loss of epithelialization. This does not look like infection it looks like a contact dermatitis but I just cannot determine to what 3/14; there is nothing on the right foot between the first and second toes this was carefully inspected under illumination. Some chronic irritation on the dorsal part of his foot from toes 1-3 at the base. Nothing really open here substantially. Still has an area on the right foot/ankle that is actually larger and hyper granulated. His buttock area on the left is just about closed however he has chronic inflammation with loss of the surface epithelial layer 3/28; 2-week follow-up. In clinic today with a new wound on the left anterior mid tibia. Says this happened about 2 weeks ago. He is not really sure how wonders about the spasticity of his legs at night whether that could have caused this other than that he does not have a good idea. He has been using topical antibiotics and silver alginate. The area on his right dorsal ankle seems somewhat better. ooFinally everything on his left buttock is closed. 4/11; 2-week follow-up. All of his wounds are better except for the area over the ischium and left buttock which have opened up widely again. At least part of this is covered in necrotic fibrinous material another part had rolled nonviable skin. The area on the right ankle, left anterior mid tibia are both a lot better. He had no open wounds on either foot including the areas between  the first and second toes 4/25; patient presents for 2-week follow-up. He states that the wounds are overall stable. He has no complaints today and states he is using Hydrofera Blue to open wounds. 5/9; have not seen this man in over a month. For my memory he has open areas on the left mid tibia and right ankle. T oday he has new open area on the right dorsal foot which we have not had a problem with recently. He has the sustained area on the left buttock He is also changed his insurance at the beginning of the year [employer]. We will need prior authorizations for debridement 5/23; patient presents for 2-week follow-up. He has prior authorizations for debridement. He denies any issues in the past 2 weeks with his wound care. He has been using Hydrofera Blue to all the wounds. He does report a circular rash to the upper left leg that is new. He denies acute signs of infection. 6/6; 2-week follow-up. The patient has open wounds on the left buttock which are worse than the last time I saw this about a month ago. He also has a new area to me on the left anterior mid tibia with some surrounding erythema. The area on the dorsal ankle on the right is closed but I think this will be a friction injury every time this area is exposed to either our wraps or his compression stockings caused by unrelenting spasms in this leg. 6/20; 2-week follow-up. oo The patient has open wounds on the left buttock which is about the same. Using [facility] here. - The left mid tibia has a static amount of surrounding erythema. Also a raised area in the center. We have been using Hydrofera Blue here. ooooFinally he has broken down in his dorsal right foot extending between the first and second toes and going to the base of the first and second toe webspace. I  have previously assumed that this was severe venous hypertension 7/5; 2-week follow-up oo The left buttock wound actually looks better. We are using Hydrofera  Blue. He has extensive skin irritation around this area and I have not really been able to get that any better. I have tried Lotrisone i.e. antifungals and steroids. More most recently we have just been using Coloplast really looks about the same. oo The left mid tibia which was new last week culture to have very resistant staph aureus. Not only methicillin-resistant but doxycycline resistant. The patient has a plethora of antibiotic allergies including sulfa, linezolid. I used topical bacitracin on this but he has not started this yet. oo In addition he has an expanding area of erythema with a wound on the dorsal right foot. I did a deep tissue culture of this area today 7/12; oo Left buttock area actually looks better surrounding skin also looks less irritated. oo Left mid tibia looks about the same. He is using bacitracin this is not worse oo Right dorsal foot looks about the same as well. oo The left first toe also looks about the same 7/19; left buttock wound continues to improve in terms of open areas oo Left mid tibia is still concerning amount of swelling he is using bacitracin oo Dorsal left first toe somewhat smaller oo Right dorsal foot somewhat smaller 7/25; left buttock wound actually continues to improve oo Left mid tibia area has less swelling. I gave him all my samples of new Nuzyra. This seems to have helped although the wound is still open it. His abrasion closed by here oo Left dorsal great toe really no better. Still a very nonviable surface oo Right dorsal foot perhaps some better. oo We have been using bacitracin and silver nitrate to the areas on his lower legs and Hydrofera Blue to the area on the buttock. 8/16 oo Disappointed that his left buttock wound is actually more substantial. Apparently during the last nurse visit these were both very small. He has continued irritation to a large area of skin on his buttock. I have never been able to totally explain  this although I think it some combination of the way he sits, pressure, moisture. He is not incontinent enough to contribute to this. oo Left dorsal great toe still fibrinous debris on the surface that I have debrided today oo Large area across the dorsal right toes. oo The area on the left anterior mid tibia has less swelling. He completed the [place]. This does not look infected although the tissue is still fried 8/30; 2-week follow-up. oo Left buttock areas not improved. We used Hydrofera Blue on this. Weeping wet with the surrounding erythema that I have not been able to control even with Temples, Laith E ([id number]) [id number] Page 30 of 37 Lotrisone and topical Coloplast oo Left dorsal great toe looks about the same oo More substantial area again at the base of his toes on the left which is new this week. oo Area across the dorsal right toes looks improved oo The left anterior mid tibia looks like it is trying to close 9/13; 2-week follow-up. Using silver alginate on all of his wounds. The left dorsal foot does not look any better. He has the area on the dorsal toe and also the areas at the base of all of his toes 1 through 3. On the right foot he has a similar pattern in a similar area. He has the area on his left mid tibia that looks fairly  healthy. Finally the area on his left buttock looks somewhat bette 9/20; culture I did of the left foot which was a deep tissue culture last time showed E. coli he has erythema around this wound. Still a completely necrotic surface. His right dorsal foot looks about the same. He has a very friable surface to the left anterior mid tibia. Both buttock wounds look better. We have been using silver alginate to all wounds 10/4; he has completed the cephalexin that I gave him last time for the left foot. He is using topical gentamicin under silver alginate silver alginate being applied to all the wounds. Unfortunately all the  wounds look irritated on his dorsal right foot dorsal left foot left mid tibia. I wonder if this could be a silver allergy. I am going to change him to [facility] on the lower extremity. The skin on the left buttock and left posterior thigh still flaking dry and irritated. This has continued no matter what I have applied topically to this. He has a solitary open wound which by itself does not look too bad however the entire area of surrounding skin does not change no matter what we have applied here 10/18; the area on the left dorsal foot and right dorsal foot both look better. The area on the right extends into the plantar but not between his toes. We have been using silver alginate. He still has a rectangular erythematous area around the area on the left tibia. The wound itself is very small. Finally everything on his left buttock looks a little larger the skin is erythematous 11/15; patient comes in with the left dorsal and right dorsal foot distally looking somewhat better. Still nonviable surface on the left foot which required debridement. He still has the area on the left anterior mid tibia although this looks somewhat better. He has a new area on the right lateral lower leg just above the ankle. Finally his left buttock looks terrible with multiple superficial open wounds geometric square shaped area of chronic erythema which I have not been able to sort through 11/29; right dorsal foot and left dorsal foot both look somewhat better. No debridement required. He has the fragile area on the left anterior mid tibia this looks and continues to look somewhat better. Right lateral lower leg just above the ankle we identified last time also looks better. In general the area on his left buttock looks improved. We are using Hydrofera Blue to all wound areas 12/13; right dorsal foot looks better. The area on the right lateral leg is healed. Left dorsal foot has 2 open areas both of which require  debridement. The fragile area on the left anterior mid tibial looks better. Smaller area on his buttocks. Were using Hydrofera Blue 1/10; patient comes in with everything looking slightly larger and/or worse. This includes his left buttock, reopening of the left mid tibia, larger areas on the left dorsal foot and what looks to be a cellulitis on the right dorsal and plantar foot. We have been using Hydrofera Blue on all wounds. 1/17; right dorsal foot distally looks better today. The left foot has 2 open wounds that are about the same surrounding erythema. 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 No malignancy they did not stain for fungus 1/24; his right distal foot is not closed dry and scaly but the wound looks like it is contracting.  I did not debride anything here. Problem on his left dorsal foot with expanding erythema. Apparently there were problems last week getting the Riki Altes however it is now available at the [facility] but a week later. He is using ketoconazole and Coloplast to the left buttock along with Hydrofera Blue this actually looks quite a bit better today. 1/31; right dorsal foot again is dry and scaly but looking to contract. He has been using a moisturizer on his feet at my request but he is not sure which 1. The left dorsal foot wounds look about the same there is erythema here that I marked last week however after course of Nuzyra it certainly is not any better but not any worse either. Finally on his left buttock the skin continues to look better he has the original wound but a new substantial area towards the gluteal cleft. Almost like a skin tear. I used scissors to remove skin and subcutaneous tissue here silver nitrate and direct pressure 2/7; right dorsal foot. This does not look too much different from last week. Some  erythema skin dry and scaly. No debridement. Left dorsal great toe again still not much improvement. I did remove flaking dry skin and callus from around the edge. Finally on his left buttock. The skin is somewhat better in the periwound. Surface wounds are superficial somewhat better than last week. 01/26/2022: Is a little bit of a mystery as to why his wounds fail to respond to treatments and actually seem to get worse. This is my first encounter with this patient. He was previously followed by Dr. Leanord Hawking. Based upon my review of the chart, it seems that there is a little bit of a mystery as to why his wounds do not respond as anticipated to the interventions applied and sometimes even get worse. Biopsies have been performed and he was seen by dermatology in [place], but that did not shed any light on the matter. T oday, his gluteal wound is larger, with substantial drainage, rather malodorous. The food wounds are not terrible, but he has a lot of callus and scaly skin around these. He is currently getting silver alginate on the gluteal wound, with idodoflex to the feet. He is using lotrisone on his legs for the dry, scaly skin. 02/09/2022: There has really been no change to any of his wounds. The gluteal wound less drainage and odor, but remains about the same size, the periwound skin remains oddly scaly. His lower extremity wounds also appear roughly the same size. They continue to accumulate a small amount of slough. The periwound on his feet and ankle wounds has dry eschar and loose dead skin. We have been using silver alginate on the gluteal wound and Iodoflex on his feet and ankle wounds. T the periwound around his gluteal lesion and Lotrisone on his feet and legs. o 02/23/2022: The right plantar foot wound is closed. The gluteal site looks small but has continued to produce hypertrophic granulation tissue. The foot wounds all look about the same on the dorsal surface of the right foot; on the  left, there is only a small open area at the site of where his left great toenail would have been. 03/16/2022: The right ankle wound is healed. The right dorsal foot wound is about the same. The left dorsal foot wound is quite a bit smaller and the ischial wound is nearly closed. 03/30/2022: The right ankle wound reopened. Both dorsal foot wounds are quite a bit smaller. Unfortunately, he  appears to have sheared part of his ischial wound open further, perhaps during a transfer. 04/13/2022: The right ankle wound has hypertrophic granulation tissue present. The dorsal foot wounds continue to decrease in size. The ischial wound looks about the same today, no better, no worse. 04/27/2022: The right ankle wound has closed. Unfortunately, it looks like some moisture got underneath the dry skin on both of his dorsal feet and these wounds have expanded in size. The ischial wound remains the same with perhaps a little bit more slough accumulation than at our previous visit. 05/11/2022: The right ankle wound remains closed. There is a left anterior tibial wound that is small has patchy openings with accumulated slough. The dorsum of his right foot appears to be nearly healed with just a small punctate opening. The plantar surface of his right foot has a new opening that looks like he may have picked some skin there. His sacral ulcer has hypertrophic granulation tissue but has some slough accumulation. The dorsum of his left foot has multiple open areas in a fairly ragged distribution. All of these have slough accumulated within them. 06/01/2022: The right ankle and left anterior tibial wound are both closed. Dorsum of his right foot and left foot both look substantially better with just tiny scattered openings The without any slough accumulation. He has sheared open new areas on his left gluteus and ischium. He says that his wheelchair cushion, which is air-filled, has a leak and so it keeps deflating. He is awaiting  a new cushion. Okray, PIO EATHERLY ([id number]) [id number] Page 31 of 37 06/15/2022: The right ankle wound has reopened and the fat layer is exposed. Both dorsal feet have just small openings with just a little bit of slough and eschar accumulation. The wound on his left gluteus and ischium is larger again today and has a foul odor. 06/29/2022: The right ankle wound has hypertrophic granulation tissue buildup. His dorsal foot wounds both look better with just some eschar on the surface. He has a new wound on his left lateral ankle. He is not sure how he acquired it but by appearance, it looks that he hit it on something, potentially his wheelchair or bed. The ischial wound is about the same but is cleaner without any significant purulent drainage or odor. He did not understand what the [facility] call was about and therefore he does not have the topical compounded antibiotic. 07/13/2022: The right ankle wound again has hypertrophic granulation tissue, but less so than at his previous visit. The ischial wound has improved tremendously the use of the [facility] topical antibiotic. No significant change to the left lateral ankle wound; it is fibrotic with slough present. The skin of both of his feet, especially on the right, has a yeasty appearance. 08/10/2022: There is again hypertrophic granulation tissue on the right anterior ankle wound. Both feet are about the same. The left lateral ankle wound is a little bit desiccated and has some slough buildup. He unfortunately suffered a new injury when he was removing his pants and they caught his bandage which caused a large skin tear on his left ischium, just distal to the existing wound. The existing left ischial wound, however, is significantly better with just a little light slough on the surface. 08/31/2022: The right anterior ankle wound is a lot smaller today underneath some eschar. No accumulation of hypertrophic granulation tissue. The  left lateral ankle wound has some slough on the surface but is better in terms of moisture balance this  week. The left dorsal foot does not really have any openings on it today. The right dorsal foot has some slough and eschar accumulation. His gluteal ulcer is basically closed aside from 2 small areas that are oozing a bit. 09/21/2022: The right anterior ankle wound is down to just a tiny pinhole. The left lateral ankle wound has accumulated slough again, but moisture balance is good. Left and right dorsal feet both have 2 small openings with some slough in them. The gluteal ulcer, unfortunately has opened up substantially. 10/05/2022: The right anterior ankle wound has reopened and has a fair amount of slough on the surface. The left lateral ankle wound has accumulated more slough, as well. He was approved for Apligraf, but the wound is not clean enough yet. There is some slough buildup on the dorsal right foot wound, as well as on his ischium. 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 10/26/2022: The right anterior ankle wound has closed again. The left lateral ankle wound looks quite a bit better. It is ready for Apligraf but we do not have 1 here for him. His ischium continues to get worse, with the wound larger and deeper. It really seems like this is a combination of pressure and friction. He has 2 new wounds, 1 on the plantar aspect of each foot. He says that he had some dry skin there that peeled away. Both are superficial. The dorsum of his left foot does not have any new wound opening. The dorsum of his right foot has a bit of slough accumulation. 11/24/2022: The right anterior ankle wound has 2 small open areas, both covered with eschar. The left lateral ankle wound has closed and considerably with just a little slough on the surface. The ischium has improved most significantly, having closed in most of the open area with just a few  small remaining superficial openings. The dorsal foot wounds are small and superficial. The bottoms of his feet are closed. 12/07/2022: The right anterior ankle wound remains open with 2 small areas, again with slough and eschar present. The left lateral ankle wound is down to about half a centimeter with just some slough on the surface. His right dorsal foot has opened up more and has both slough and eschar present. The ischium has continued to improve and now just has 2 small open areas that are very clean. 12/21/2022: He has a new wound on his right lateral ankle. There is some slough present. The left lateral ankle is quite a bit smaller with some slough buildup. His left ischial ulcer is also nearly closed; there is just a tear in his skin that seems likely to be secondary to a transfer mishap. He has a new ulcer in his natal cleft that looks like it may be secondary to moisture. There is some slough in this area as well. The right anterior ankle is nearly closed. The right dorsal foot wound has some slough accumulation 01/04/2023: The anterior right ankle wound is closed, as is his ischial ulcer. The left lateral ankle wound is nearly closed with just a little bit of slough accumulation. The ulcer in his natal cleft is about the same size with slough buildup there, as well. He has a new wound on his left lateral leg near the knee from rubbing on his wheelchair. There is slough present, but no signs of infection. The right dorsal foot wound has a fairly heavy layer  of slough buildup, as well. 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 He has been using his old wheelchair cushion for reasons that are unclear. The ulcer in his natal cleft has healed. The wound on his left lateral leg near the knee is smaller with some slough present. The right dorsal foot wound has accumulated slough again. The right lateral ankle wound is healed. The left lateral ankle wound is smaller again this  week. 02/01/2023: His ischial ulcer has had a ton of drainage over the last 2 weeks and the drainage has an absolutely putrid odor. He has gone back to using his new wheelchair chair cushion, though. The wound on his left lateral leg near his knee has healed. The left lateral ankle wound is nearly healed. Both the dorsal foot wounds are larger and have a lot of slough accumulation and drainage. The wound on his right anterior ankle is healed but there is a new 1 just proximal to this, also with slough and eschar present. 02/15/2023:The left lateral and right lateral ankle wounds are both smaller. The right anterior ankle wound has some eschar on the surface. Both dorsal feet have a lot of slough accumulation. The ischial ulcer has epithelialized in multiple areas, leaving several smaller areas that are quite friable and still with a pungent odor. His new [facility] prescription should be delivered tomorrow. We are using silver alginate on everything at this point. 03/08/2023: Both dorsal foot wounds are about the same size and have a lot of slough buildup. The ischial ulcer has improved quite a bit. The odor has abated and the surfaces are clean. It is still quite friable and 1 area is oozing. The left lateral and right lateral ankle wounds are nearly closed. The right anterior ankle wound is healed. 03/22/2023: The right lateral ankle wound is healed. The left lateral ankle wound is tiny with just a little bit of slough accumulation. The dorsal foot wounds look about the same. The ischial ulcer is considerably smaller and there is no odor. There is slight slough accumulation. Patient History Information obtained from Patient. Family History Diabetes - Father, Hypertension - Mother, No family history of Cancer, Heart Disease, Hereditary Spherocytosis, Kidney Disease, Lung Disease, Stroke, Thyroid Problems, Tuberculosis. Social History Former smoker, Marital Status - Married, Alcohol Use - Moderate, Drug  Use - No History, Caffeine Use - Daily. Medical History Ear/Nose/Mouth/Throat Denies history of Chronic sinus problems/congestion, Middle ear problems Hematologic/Lymphatic Denies history of Anemia, Hemophilia, Human Immunodeficiency Virus, Lymphedema, Sickle Cell Disease Respiratory Patient has history of Sleep Apnea - does not tolerate cpap Denies history of Aspiration, Asthma, Chronic Obstructive Pulmonary Disease (COPD), Pneumothorax, Tuberculosis Cardiovascular Patient has history of Hypertension - lisinopril HCTZ Denies history of Angina, Arrhythmia, Congestive Heart Failure, Coronary Artery Disease, Deep Vein Thrombosis, Hypotension, Myocardial Infarction, Scull, Irene E ([id number]) [id number] Page 32 of 37 Peripheral Arterial Disease, Peripheral Venous Disease, Phlebitis, Vasculitis Gastrointestinal Denies history of Cirrhosis , Colitis, Crohnoos, Hepatitis A, Hepatitis B, Hepatitis C Endocrine Denies history of Type I Diabetes, Type II Diabetes Genitourinary Denies history of End Stage Renal Disease Immunological Denies history of Lupus Erythematosus, Raynaudoos, Scleroderma Integumentary (Skin) Denies history of History of Burn Musculoskeletal Denies history of Gout, Rheumatoid Arthritis, Osteoarthritis, Osteomyelitis Neurologic Patient has history of Paraplegia Denies history of Dementia, Neuropathy, Quadriplegia, Seizure Disorder Oncologic Denies history of Received Chemotherapy, Received Radiation Psychiatric Denies history of Anorexia/bulimia, Confinement Anxiety Hospitalization/Surgery History - cellulitis in leg. - left leg vein ablation. Objective Constitutional Tachycardic, asymptomatic.  no acute distress. Vitals Time Taken: 8:06 AM, Height: 70 in, Weight: 216 lbs, BMI: 31, Temperature: 97.9 F, Pulse: 118 bpm, Respiratory Rate: 20 breaths/min, Blood Pressure: 137/74 mmHg. Respiratory Normal work of breathing on room  air. General Notes: 03/22/2023: The right lateral ankle wound is healed. The left lateral ankle wound is tiny with just a little bit of slough accumulation. The dorsal foot wounds look about the same. The ischial ulcer is considerably smaller and there is no odor. There is slight slough accumulation. Integumentary (Hair, Skin) Wound #52 status is Open. Original cause of wound was Gradually Appeared. The date acquired was: 03/27/2021. The wound has been in treatment 103 weeks. The wound is located on the Right,Dorsal Foot. The wound measures 1.4cm length x 3.5cm width x 0.1cm depth; 3.848cm^2 area and 0.385cm^3 volume. There is Fat Layer (Subcutaneous Tissue) exposed. There is no tunneling or undermining noted. There is a medium amount of serosanguineous drainage noted. The wound margin is flat and intact. There is medium (34-66%) pink granulation within the wound bed. There is a medium (34-66%) amount of necrotic tissue within the wound bed. The periwound skin appearance had no abnormalities noted for texture. The periwound skin appearance had no abnormalities noted for color. The periwound skin appearance exhibited: Dry/Scaly. Periwound temperature was noted as No Abnormality. Wound #56 status is Open. Original cause of wound was Gradually Appeared. The date acquired was: 07/11/2021. The wound has been in treatment 87 weeks. The wound is located on the Left,Dorsal Foot. The wound measures 1.8cm length x 3cm width x 0.1cm depth; 4.241cm^2 area and 0.424cm^3 volume. There is Fat Layer (Subcutaneous Tissue) exposed. There is no tunneling or undermining noted. There is a medium amount of serosanguineous drainage noted. The wound margin is flat and intact. There is medium (34-66%) red granulation within the wound bed. There is a medium (34-66%) amount of necrotic tissue within the wound bed including Adherent Slough. The periwound skin appearance had no abnormalities noted for texture. The periwound skin  appearance exhibited: Dry/Scaly. The periwound skin appearance did not exhibit: Maceration, Erythema. Periwound temperature was noted as No Abnormality. Wound #67 status is Open. Original cause of wound was Gradually Appeared. The date acquired was: 06/22/2022. The wound has been in treatment 38 weeks. The wound is located on the Left,Lateral Lower Leg. The wound measures 0.5cm length x 0.3cm width x 0.1cm depth; 0.118cm^2 area and 0.012cm^3 volume. There is Fat Layer (Subcutaneous Tissue) exposed. There is no tunneling or undermining noted. There is a small amount of serosanguineous drainage noted. The wound margin is distinct with the outline attached to the wound base. There is large (67-100%) red granulation within the wound bed. There is a small (1-33%) amount of necrotic tissue within the wound bed including Adherent Slough. The periwound skin appearance had no abnormalities noted for texture. The periwound skin appearance exhibited: Dry/Scaly. The periwound skin appearance did not exhibit: Erythema. Periwound temperature was noted as No Abnormality. Wound #74 status is Open. Original cause of wound was Pressure Injury. The date acquired was: 01/10/2023. The wound has been in treatment 9 weeks. The wound is located on the Left,Posterior Upper Leg. The wound measures 2.1cm length x 1.3cm width x 0.1cm depth; 2.144cm^2 area and 0.214cm^3 volume. There is Fat Layer (Subcutaneous Tissue) exposed. There is no tunneling or undermining noted. There is a medium amount of serosanguineous drainage noted. The wound margin is flat and intact. There is large (67-100%) red, friable granulation within the wound bed. There is  no necrotic tissue within the wound bed. The periwound skin appearance had no abnormalities noted for moisture. The periwound skin appearance had no abnormalities noted for color. The periwound skin appearance exhibited: Scarring. Periwound temperature was noted as No Abnormality. Wound #76  status is Healed - Epithelialized. Original cause of wound was Pressure Injury. The date acquired was: 02/08/2023. The wound has been in treatment 5 weeks. The wound is located on the Right,Lateral Lower Leg. The wound measures 0cm length x 0cm width x 0cm depth; 0cm^2 area and 0cm^3 volume. There is no tunneling or undermining noted. There is a none present amount of drainage noted. There is no granulation within the wound bed. There is no necrotic tissue within the wound bed. The periwound skin appearance had no abnormalities noted for texture. The periwound skin appearance had no abnormalities noted for moisture. The periwound skin appearance had no abnormalities noted for color. Periwound temperature was noted as No Abnormality. 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 There was a Excisional Skin/Subcutaneous Tissue Debridement with a total area of 3.85 sq cm performed by Duanne Guess, MD. With the following instrument(s): Curette to remove Viable and Non-Viable tissue/material. Material removed includes Subcutaneous Tissue and Slough and. No specimens were taken. A time out was conducted at 08:50, prior to the start of the procedure. A Minimum amount of bleeding was controlled with Pressure.  The procedure was tolerated well with a pain level of Insensate throughout and a pain level of Insensate following the procedure. Post Debridement Measurements: 1.4cm length x 3.5cm width x 0.1cm depth; 0.385cm^3 volume. Character of Wound/Ulcer Post Debridement is improved. Severity of Tissue Post Debridement is: Fat layer exposed. Post procedure Diagnosis Wound #52: Same as Pre-Procedure General Notes: Scribed for Dr. Lady Gary by Zenaida Deed, RN. Wound #56 Pre-procedure diagnosis of Wound #56 is a Neuropathic Ulcer-Non Diabetic located on the Left,Dorsal Foot . There was a Excisional Skin/Subcutaneous Tissue Debridement with a total area of 4.24 sq cm performed by Duanne Guess, MD. With the following instrument(s): Curette to remove Viable and Non-Viable tissue/material. Material removed includes Subcutaneous Tissue and Slough and. No specimens were taken. A time out was conducted at 08:50, prior to the start of the procedure. A Minimum amount of bleeding was controlled with Pressure. The procedure was tolerated well with a pain level of Insensate throughout and a pain level of Insensate following the procedure. Post Debridement Measurements: 1.8cm length x 3cm width x 0.1cm depth; 0.424cm^3 volume. Character of Wound/Ulcer Post Debridement is improved. Post procedure Diagnosis Wound #56: Same as Pre-Procedure General Notes: Scribed for Dr. Lady Gary by Zenaida Deed, RN. Wound #67 Pre-procedure diagnosis of Wound #67 is a Venous Leg Ulcer located on the Left,Lateral Lower Leg .Severity of Tissue Pre Debridement is: Fat layer exposed. There was a Selective/Open Wound Non-Viable Tissue Debridement with a total area of 0.12 sq cm performed by Duanne Guess, MD.  With the following instrument(s): Curette to remove Non-Viable tissue/material. Material removed includes Eschar and Slough and. No specimens were taken. A time out was conducted at 08:50, prior to the start of the procedure. A  Minimum amount of bleeding was controlled with Pressure. The procedure was tolerated well with a pain level of Insensate throughout and a pain level of Insensate following the procedure. Post Debridement Measurements: 0.5cm length x 0.3cm width x 0.1cm depth; 0.012cm^3 volume. Character of Wound/Ulcer Post Debridement is improved. Severity of Tissue Post Debridement is: Fat layer exposed. Post procedure Diagnosis Wound #67: Same as Pre-Procedure General Notes: Scribed for Dr. Lady Gary by Zenaida Deed, RN. Wound #74 Pre-procedure diagnosis of Wound #74 is a Pressure Ulcer located on the Left,Posterior Upper Leg . There was a Selective/Open Wound Non-Viable Tissue Debridement with a total area of 2.14 sq cm performed by Duanne Guess, MD. With the following instrument(s): Curette to remove Non-Viable tissue/material. Material removed includes Eschar and Slough and. No specimens were taken. A time out was conducted at 08:50, prior to the start of the procedure. A Minimum amount of bleeding was controlled with Pressure. The procedure was tolerated well with a pain level of Insensate throughout and a pain level of Insensate following the procedure. Post Debridement Measurements: 2.1cm length x 1.3cm width x 0.1cm depth; 0.214cm^3 volume. Post debridement Stage noted as Category/Stage III. Character of Wound/Ulcer Post Debridement is improved. Post procedure Diagnosis Wound #74: Same as Pre-Procedure General Notes: Scribed for Dr. Lady Gary by Zenaida Deed, RN. Plan Follow-up Appointments: Return Appointment in 2 weeks. - Dr. Lady Gary RM 1 Tuesday 5/14 @ 0815 am Bathing/ Shower/ Hygiene: May shower and wash wound with soap and water. - on days that dressing is changed Edema Control - Lymphedema / SCD / Other: Moisturize legs daily. - using Aquaphor generously to both legs and feet with dressing changes Compression stocking or Garment 30-40 mm/Hg pressure to: - Juxtalite to both legs  daily Off-Loading: Roho cushion for wheelchair - use newer cushion Turn and reposition every 2 hours - lift up with arms in wheelchair every hour during the day WOUND #52: - Foot Wound Laterality: Dorsal, Right Cleanser: Soap and Water Every Other Day/30 Days Discharge Instructions: May shower and wash wound with dial antibacterial soap and water prior to dressing change. Cleanser: Wound Cleanser Every Other Day/30 Days Discharge Instructions: Cleanse the wound with wound cleanser prior to applying a clean dressing using gauze sponges, not tissue or cotton balls. Peri-Wound Care: Sween Lotion (Moisturizing lotion) Every Other Day/30 Days Discharge Instructions: Apply Aquaphor moisturizing lotion as directed Topical: Mupirocin Ointment Every Other Day/30 Days Discharge Instructions: Apply Mupirocin (Bactroban) as instructed Prim Dressing: Maxorb Extra Ag+ Alginate Dressing, 2x2 (in/in) Every Other Day/30 Days ary Discharge Instructions: Apply to wound bed as instructed Secondary Dressing: Woven Gauze Sponge, Non-Sterile 4x4 in Every Other Day/30 Days Discharge Instructions: Apply over primary dressing as directed. Depp, NEERAJ HOUSAND ([id number]) [id number] Page 34 of 37 Secured With: [employer], 4.5x3.1 (in/yd) (Generic) Every Other Day/30 Days Discharge Instructions: Secure with Kerlix as directed. Secured With: 45M Medipore H Soft Cloth Surgical T ape, 4 x 10 (in/yd) (Generic) Every Other Day/30 Days Discharge Instructions: Secure with tape as directed. WOUND #56: - Foot Wound Laterality: Dorsal, Left Cleanser: Soap and Water Every Other Day/30 Days Discharge Instructions: May shower and wash wound with dial antibacterial soap and water prior to dressing change. Cleanser: Wound Cleanser Every Other Day/30 Days Discharge Instructions: Cleanse the wound with wound cleanser  prior to applying a clean dressing using gauze sponges, not tissue or cotton  balls. Peri-Wound Care: Sween Lotion (Moisturizing lotion) Every Other Day/30 Days Discharge Instructions: Apply Aquaphor moisturizing lotion as directed Topical: Mupirocin Ointment Every Other Day/30 Days Discharge Instructions: Apply Mupirocin (Bactroban) as instructed Prim Dressing: Maxorb Extra Ag+ Alginate Dressing, 2x2 (in/in) Every Other Day/30 Days ary Discharge Instructions: Apply to wound bed as instructed Secondary Dressing: Woven Gauze Sponge, Non-Sterile 4x4 in Every Other Day/30 Days Discharge Instructions: Apply over primary dressing as directed. Secured With: [employer], 4.5x3.1 (in/yd) (Generic) Every Other Day/30 Days Discharge Instructions: Secure with Kerlix as directed. Secured With: 36M Medipore H Soft Cloth Surgical T ape, 4 x 10 (in/yd) (Generic) Every Other Day/30 Days Discharge Instructions: Secure with tape as directed. WOUND #67: - Lower Leg Wound Laterality: Left, Lateral Cleanser: Soap and Water Every Other Day/30 Days Discharge Instructions: May shower and wash wound with dial antibacterial soap and water prior to dressing change. Cleanser: Wound Cleanser Every Other Day/30 Days Discharge Instructions: Cleanse the wound with wound cleanser prior to applying a clean dressing using gauze sponges, not tissue or cotton balls. Peri-Wound Care: Sween Lotion (Moisturizing lotion) Every Other Day/30 Days Discharge Instructions: Apply Aquaphor moisturizing lotion as directed Topical: Mupirocin Ointment Every Other Day/30 Days Discharge Instructions: Apply Mupirocin (Bactroban) as instructed Prim Dressing: Maxorb Extra Ag+ Alginate Dressing, 2x2 (in/in) Every Other Day/30 Days ary Discharge Instructions: Apply to wound bed as instructed Secondary Dressing: Woven Gauze Sponge, Non-Sterile 4x4 in Every Other Day/30 Days Discharge Instructions: Apply over primary dressing as directed. Secured With: [employer], 4.5x3.1 (in/yd) (Generic) Every Other Day/30  Days Discharge Instructions: Secure with Kerlix as directed. Secured With: 36M Medipore H Soft Cloth Surgical T ape, 4 x 10 (in/yd) (Generic) Every Other Day/30 Days Discharge Instructions: Secure with tape as directed. WOUND #74: - Upper Leg Wound Laterality: Left, Posterior Cleanser: Vashe 5.8 (oz) 1 x Per Day/30 Days Discharge Instructions: Cleanse the wound with Vashe prior to applying a clean dressing using gauze sponges , let sit on wound for 10 minutes Peri-Wound Care: Zinc Oxide Ointment 30g tube 1 x Per Day/30 Days Discharge Instructions: Apply Zinc Oxide to periwound with each dressing change as needed fpr moisture Topical: Keystone antibiotic compound 1 x Per Day/30 Days Prim Dressing: Maxorb Extra Ag+ Alginate Dressing, 4x4.75 (in/in) 1 x Per Day/30 Days ary Discharge Instructions: Apply to wound bed as instructed Secondary Dressing: Zetuvit Plus Silicone Border Sacrum Dressing, Sm, 7x7 (in/in) 1 x Per Day/30 Days Discharge Instructions: Apply silicone border over primary dressing as directed. Secured With: 36M Medipore H Soft Cloth Surgical T ape, 4 x 10 (in/yd) 1 x Per Day/30 Days Discharge Instructions: Secure with tape as directed. 03/22/2023: The right lateral ankle wound is healed. The left lateral ankle wound is tiny with just a little bit of slough accumulation. The dorsal foot wounds look about the same. The ischial ulcer is considerably smaller and there is no odor. There is slight slough accumulation. I used a curette to debride slough and subcutaneous tissue from both of the dorsal foot wounds. I debrided slough off of the ischial wound and the left lateral ankle wound. We will continue topical mupirocin and silver alginate to both feet, silver alginate to the ankle, and Keystone topical antibiotic compound (prescription drug) to the ischial ulcer, along with silver alginate. He will follow-up in 2 weeks. Electronic Signature(s) Signed: 03/22/2023 9:16:45 AM By: Duanne Guess MD FACS Entered By: Lady Gary,  Victorino Dike on 03/22/2023 09:16:45 -------------------------------------------------------------------------------- HxROS Details Patient Name: Date of Service: Maslowski, A [name] 03/22/2023 8:15 A M Medical Record Number: [id number] Patient Account Number: [id number] Date of Birth/Sex: Treating RN: Mar 30, 1986 (37 y.o. M) Primary Care Provider: [name], [name] Other Clinician: Referring Provider: Treating Provider/Extender: Fredderick Severance [name]: 376 Information Obtained From Patient Ear/Nose/Mouth/Throat Medical History: Negative for: Chronic sinus problems/congestion; Middle ear problems Acoff, Tywone E ([id number]) [id number] Page 35 of 37 Hematologic/Lymphatic Medical History: Negative for: Anemia; Hemophilia; Human Immunodeficiency Virus; Lymphedema; Sickle Cell Disease Respiratory Medical History: Positive for: Sleep Apnea - does not tolerate cpap Negative for: Aspiration; Asthma; Chronic Obstructive Pulmonary Disease (COPD); Pneumothorax; Tuberculosis Cardiovascular Medical History: Positive for: Hypertension - lisinopril HCTZ Negative for: Angina; Arrhythmia; Congestive Heart Failure; Coronary Artery Disease; Deep Vein Thrombosis; Hypotension; Myocardial Infarction; Peripheral Arterial Disease; Peripheral Venous Disease; Phlebitis; Vasculitis Gastrointestinal Medical History: Negative for: Cirrhosis ; Colitis; Crohns; Hepatitis A; Hepatitis B; Hepatitis C Endocrine Medical History: Negative for: Type I Diabetes; Type II Diabetes Genitourinary Medical History: Negative for: End Stage Renal Disease Immunological Medical History: Negative for: Lupus Erythematosus; Raynauds; Scleroderma Integumentary (Skin) Medical History: Negative for: History of Burn Musculoskeletal Medical History: Negative for: Gout; Rheumatoid Arthritis; Osteoarthritis; Osteomyelitis Neurologic Medical  History: Positive for: Paraplegia Negative for: Dementia; Neuropathy; Quadriplegia; Seizure Disorder Oncologic Medical History: Negative for: Received Chemotherapy; Received Radiation Psychiatric Medical History: Negative for: Anorexia/bulimia; Confinement Anxiety Immunizations Pneumococcal Vaccine: Received Pneumococcal Vaccination: No Immunization Notes: doesn't remember when last tetanus shot was Implantable Devices No devices added Hospitalization / Surgery History Type of Hospitalization/Surgery cellulitis in leg Mungia, Aariv E ([id number]) [id number] Page 36 of 37 left leg vein ablation Family and Social History Cancer: No; Diabetes: Yes - Father; Heart Disease: No; Hereditary Spherocytosis: No; Hypertension: Yes - Mother; Kidney Disease: No; Lung Disease: No; Stroke: No; Thyroid Problems: No; Tuberculosis: No; Former smoker; Marital Status - Married; Alcohol Use: Moderate; Drug Use: No History; Caffeine Use: Daily; Financial Concerns: No; Food, Clothing or Shelter Needs: No; Support System Lacking: No; Transportation Concerns: No Psychologist, prison and probation services) Signed: 03/22/2023 3:54:11 PM By: Duanne Guess MD FACS Entered By: Duanne Guess on 03/22/2023 09:15:01 -------------------------------------------------------------------------------- SuperBill Details Patient Name: Date of Service: Mantia, A [name] 03/22/2023 Medical Record Number: [id number] Patient Account Number: [id number] Date of Birth/Sex: Treating RN: December 03, 1985 (36 y.o. 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 Facility Procedures : CPT4 Code: 62952841 Description: 11042 - DEB SUBQ TISSUE 20 SQ CM/< ICD-10 Diagnosis Description L97.518 Non-pressure chronic ulcer of other part of right foot with other specified sever L97.522 Non-pressure chronic ulcer of other part of left foot with fat layer exposed Modifier: ity Quantity: 1 : CPT4 Code: [id number] Description: 97597 - DEBRIDE WOUND 1ST 20 SQ CM OR < ICD-10 Diagnosis Description 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, stage 3 Modifier: Quantity: 1 Physician Procedures : CPT4 Code  Description Modifier [id number] 99214 - WC PHYS LEVEL 4 - EST PT 25 ICD-10 Diagnosis Description L97.518 Non-pressure chronic ulcer of other part of right foot with other specified severity L97.522 Non-pressure chronic ulcer of other part of  left foot with fat layer exposed 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

## 2023-03-23 NOTE — Progress Notes (Signed)
Henrie, OLUWANIFEMI SUSMAN ([id number]) [id number] Page 1 of 13 Visit Report for 03/22/2023 Arrival Information Details Patient Name: Date of Service: Dessert, Gillermo Murdoch 03/22/2023 8:15 A M Medical Record Number: [id number] Patient Account Number: [id number] Date of Birth/Sex: Treating RN: October 24, 1986 (37 y.o. M) Primary Care Cherye Gaertner: [name], [name] Other Clinician: Referring Klaus Casteneda: Treating Mehr Depaoli/Extender: Fredderick Severance [name]: 376 Visit Information History Since Last Visit All ordered tests and consults were completed: No Patient Arrived: Wheel Chair Added or deleted any medications: No Arrival Time: 08:06 Any new allergies or adverse reactions: No Accompanied By: self Had a fall or experienced change in No Transfer Assistance: None activities of daily living that may affect Patient Identification Verified: Yes risk of falls: Secondary Verification Process Completed: Yes Signs or symptoms of abuse/neglect since last visito No Patient Requires Transmission-Based Precautions: No Hospitalized since last visit: No Patient Has Alerts: Yes Implantable device outside of the clinic excluding No Patient Alerts: R ABI = 1.0 cellular tissue based products placed in the center L ABI = 1.1 since last visit: Has Dressing in Place as Prescribed: Yes Has Compression in Place as Prescribed: Yes Pain Present Now: No Electronic Signature(s) Signed: 03/22/2023 4:43:48 PM By: Zenaida Deed RN, BSN Entered By: Zenaida Deed on 03/22/2023 08:13:09 -------------------------------------------------------------------------------- Encounter Discharge Information Details Patient Name: Date of Service: Polack, [name] 03/22/2023 8:15 A M Medical Record Number: [id number] Patient Account Number: [id number] Date of Birth/Sex: Treating RN: 05-Aug-1986 (37 y.o. Damaris Schooner Primary Care Alexandrina Fiorini: [name], [name] Other Clinician: Referring  Elvin Banker: Treating Clare Casto/Extender: Fredderick Severance [name]: 376 Encounter Discharge Information Items Post Procedure Vitals Discharge Condition: Stable Temperature (F): 97.9 Ambulatory Status: Wheelchair Pulse (bpm): 118 Discharge Destination: Home Respiratory Rate (breaths/min): 18 Transportation: Private Auto Blood Pressure (mmHg): 137/74 Accompanied By: self Schedule Follow-up Appointment: Yes Clinical Summary of Care: Patient Declined Electronic Signature(s) Signed: 03/22/2023 4:43:48 PM By: Zenaida Deed RN, BSN Entered By: Zenaida Deed on 03/22/2023 09:16:29 -------------------------------------------------------------------------------- Lower Extremity Assessment Details Patient Name: Date of Service: Dauphinais, [name] 03/22/2023 8:15 A M Medical Record Number: [id number] Patient Account Number: [id number] Date of Birth/Sex: Treating RN: 11/19/1986 (37 y.o. Damaris Schooner Primary Care Dorthy Magnussen: [name], [name] Other Clinician: Referring Ravenne Wayment: Treating Brunilda Eble/Extender: Benjamine Mola, [name]: 376 Edema Assessment Left: [Left: Right] [Right: :] Assessed: [Left: No] [Right: No] Edema: [Left: Yes] [Right: Yes] Calf Left: Right: Point of Measurement: 33 cm From Medial Instep 29.5 cm 33 cm Ankle Left: Right: Point of Measurement: 10 cm From Medial Instep 25.4 cm 24.1 cm Vascular Assessment Pulses: Dorsalis Pedis Palpable: [Left:No] Electronic Signature(s) Signed: 03/22/2023 4:43:48 PM By: Zenaida Deed RN, BSN Entered By: Zenaida Deed on 03/22/2023 08:18:03 -------------------------------------------------------------------------------- Multi Wound Chart Details Patient Name: Date of Service: Beecham, [name] 03/22/2023 8:15 A M Medical Record Number: [id number] Patient Account Number: [id number] Date of Birth/Sex: Treating RN: February 22, 1986 (36 y.o. 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 Primary Etiology: Sleep Apnea, Hypertension, Paraplegia Sleep Apnea, Hypertension, Paraplegia Sleep Apnea, Hypertension, Paraplegia Comorbid History: 03/27/2021 07/11/2021 06/22/2022 Date Acquired: 103 87 38 Weeks of  Treatment: Open Open Open Wound Status: No No No Wound Recurrence: No Yes No Clustered Wound: 1.4x3.5x0.1 1.8x3x0.1 0.5x0.3x0.1 Measurements L x W x D (cm) 3.848 4.241 0.118 A (cm) : rea 0.385 0.424 0.012 Volume (cm) : -104.10% 31.80% 97.90% % Reduction in A rea: -104.80% 31.80% 97.90% % Reduction in Volume: Full Thickness Without Exposed Full Thickness Without Exposed Full Thickness Without Exposed Classification: Support Structures Support Structures Support Structures Medium Medium Small Exudate Amount: Serosanguineous Serosanguineous Serosanguineous Exudate Type: red, brown red, brown red, brown Exudate Color: Flat and Intact Flat and Intact Distinct, outline attached Wound Margin: Medium (34-66%) Medium (34-66%) Large (67-100%) Granulation Amount: Pink Red Red Granulation Quality: Medium (34-66%) Medium (34-66%) Small (1-33%) Necrotic Amount: Davie, Ajahni E ([id number]) [id number] Page 3 of 13 Fat Layer (Subcutaneous Tissue): Yes Fat Layer (Subcutaneous Tissue): Yes Fat Layer (Subcutaneous Tissue): Yes Exposed Structures: Fascia: No Fascia: No Fascia: No Tendon: No Tendon: No Tendon: No Muscle: No Muscle:  No Muscle: No Joint: No Joint: No Joint: No Bone: No Bone: No Bone: No Small (1-33%) Small (1-33%) Large (67-100%) Epithelialization: Debridement - Excisional Debridement - Excisional Debridement - Selective/Open Wound Debridement: Pre-procedure Verification/Time Out 08:50 08:50 08:50 Taken: Subcutaneous, Slough Subcutaneous, Ambulance person, [employer] Tissue Debrided: Skin/Subcutaneous Tissue Skin/Subcutaneous Tissue Non-Viable Tissue Level: 3.85 4.24 0.12 Debridement A (sq cm): rea Curette Curette Curette Instrument: Minimum Minimum Minimum Bleeding: Pressure Pressure Pressure Hemostasis A chieved: Insensate Insensate Insensate Procedural Pain: Insensate Insensate Insensate Post Procedural Pain: Procedure was tolerated well Procedure was tolerated well Procedure was tolerated well Debridement Treatment Response: 1.4x3.5x0.1 1.8x3x0.1 0.5x0.3x0.1 Post Debridement Measurements L x W x D (cm) 0.385 0.424 0.012 Post Debridement Volume: (cm) N/A N/A N/A Post Debridement Stage: No Abnormalities Noted No Abnormalities Noted No Abnormalities Noted Periwound Skin Texture: Dry/Scaly: Yes Dry/Scaly: Yes Dry/Scaly: Yes Periwound Skin Moisture: Maceration: No Erythema: No Erythema: No Erythema: No Periwound Skin Color: No Abnormality No Abnormality No Abnormality Temperature: Debridement Debridement Debridement Procedures Performed: Wound Number: [id number] N/A Photos: N/A Left, Posterior Upper Leg Right, Lateral Lower Leg N/A Wound Location: Pressure Injury Pressure Injury N/A Wounding Event: Pressure Ulcer Venous Leg Ulcer N/A Primary Etiology: Sleep Apnea, Hypertension, Paraplegia Sleep Apnea, Hypertension, Paraplegia N/A Comorbid History: 01/10/2023 02/08/2023 N/A Date Acquired: 9 5 N/A Weeks of Treatment: Open Healed - Epithelialized N/A Wound Status: No No N/A Wound Recurrence: No No N/A Clustered Wound: 2.1x1.3x0.1 0x0x0 N/A Measurements L x W x D  (cm) 2.144 0 N/A A (cm) : rea 0.214 0 N/A Volume (cm) : 97.10% 100.00% N/A % Reduction in A rea: 97.10% 100.00% N/A % Reduction in Volume: Category/Stage III Full Thickness Without Exposed N/A Classification: Support Structures Medium None Present N/A Exudate A mount: Serosanguineous N/A N/A Exudate Type: red, brown N/A N/A Exudate Color: Flat and Intact N/A N/A Wound Margin: Large (67-100%) None Present (0%) N/A Granulation A mount: Red, Friable N/A N/A Granulation Quality: None Present (0%) None Present (0%) N/A Necrotic A mount: Fat Layer (Subcutaneous Tissue): Yes Fascia: No N/A Exposed Structures: Fascia: No Fat Layer (Subcutaneous Tissue): No Tendon: No Tendon: No Muscle: No Muscle: No Joint: No Joint: No Bone: No Bone: No Small (1-33%) Large (67-100%) N/A Epithelialization: Debridement - Selective/Open Wound N/A N/A Debridement: Pre-procedure Verification/Time Out 08:50 N/A N/A Taken: Necrotic/Eschar, Slough N/A N/A Tissue Debrided: Non-Viable Tissue N/A N/A Level: 2.14 N/A N/A Debridement A (sq cm): rea Curette N/A N/A Instrument: Minimum N/A N/A Bleeding: Pressure N/A N/A Hemostasis A chieved: Insensate N/A N/A Procedural Pain: Insensate  N/A N/A Post Procedural Pain: Procedure was tolerated well N/A N/A Debridement Treatment Response: 2.1x1.3x0.1 N/A N/A Post Debridement Measurements L x W x D (cm) 0.214 N/A N/A Post Debridement Volume: (cm) Jerome, Jamesrobert E ([id number]) [id number] Page 4 of 13 Category/Stage III N/A N/A Post Debridement Stage: Scarring: Yes No Abnormalities Noted N/A Periwound Skin Texture: No Abnormalities Noted Dry/Scaly: Yes N/A Periwound Skin Moisture: No Abnormalities Noted No Abnormalities Noted N/A Periwound Skin Color: No Abnormality No Abnormality N/A Temperature: Debridement N/A N/A Procedures Performed: Treatment Notes Electronic Signature(s) Signed: 03/22/2023 9:11:05 AM  By: Duanne Guess MD FACS Entered By: Duanne Guess on 03/22/2023 09:11:05 -------------------------------------------------------------------------------- Multi-Disciplinary Care Plan Details Patient Name: Date of Service: Magid, [name] 03/22/2023 8:15 A M Medical Record Number: [id number] Patient Account Number: [id number] Date of Birth/Sex: Treating RN: [birth year] (37 y.o. Damaris Schooner Primary Care Tremond Shimabukuro: [name], [name] Other Clinician: Referring Ralphie Lovelady: Treating Jams Trickett/Extender: Fredderick Severance [name]: 376 Multidisciplinary Care Plan reviewed with physician Active Inactive Pressure Nursing Diagnoses: Knowledge deficit related to management of pressures ulcers Potential for impaired tissue integrity related to pressure, friction, moisture, and shear Goals: Patient will remain free from development of additional pressure ulcers Date Initiated: 06/21/2018 Date Inactivated: 02/12/2019 Target Resolution Date: 02/16/2019 Unmet Reason: pressure ulcer Goal Status: Unmet reopened on left ischium Patient/caregiver will verbalize risk factors for pressure ulcer development Date Initiated: 06/21/2018 Date Inactivated: 05/28/2019 Target Resolution Date: 06/01/2019 Goal Status: Met Patient/caregiver will verbalize understanding of pressure ulcer management Date Initiated: 06/01/2022 Target Resolution Date: 04/12/2023 Goal Status: Active Interventions: Assess: immobility, friction, shearing, incontinence upon admission and as needed Assess offloading mechanisms upon admission and as needed Assess potential for pressure ulcer upon admission and as needed Treatment Activities: Pressure reduction/relief device ordered : 12/05/2017 Notes: Wound/Skin Impairment Nursing Diagnoses: Impaired tissue integrity Knowledge deficit related to ulceration/compromised skin integrity Goals: Patient/caregiver will verbalize understanding of skin care regimen Date  Initiated: 06/01/2022 Target Resolution Date: 04/12/2023 Goal Status: Active Ulcer/skin breakdown will have a volume reduction of 30% by week 4 Date Initiated: 06/01/2022 Date Inactivated: 06/29/2022 Target Resolution Date: 06/29/2022 Unmet Reason: complex wounds, Goal Status: Unmet infection Interventions: Assess patient/caregiver ability to obtain necessary supplies Nordlund, Sora E ([id number]) [id number] Page 5 of 13 Assess ulceration(s) every visit Provide education on ulcer and skin care Notes: 02/02/21: Complex Care, ongoing. Electronic Signature(s) Signed: 03/22/2023 4:43:48 PM By: Zenaida Deed RN, BSN Entered By: Zenaida Deed on 03/22/2023 08:37:32 -------------------------------------------------------------------------------- Pain Assessment Details Patient Name: Date of Service: Warth, [name] 03/22/2023 8:15 A M Medical Record Number: [id number] Patient Account Number: [id number] Date of Birth/Sex: Treating RN: 12-13-1985 (37 y.o. M) Primary Care Anjela Cassara: [name], [name] Other Clinician: Referring Lamichael Youkhana: Treating Helia Haese/Extender: Fredderick Severance [name]: 376 Active Problems Location of Pain Severity and Description of Pain Patient Has Paino No Site Locations Rate the pain. Current Pain Level: 0 Pain Management and Medication Current Pain Management: Electronic Signature(s) Signed: 03/22/2023 4:43:48 PM By: Zenaida Deed RN, BSN Entered By: Zenaida Deed on 03/22/2023 08:13:23 -------------------------------------------------------------------------------- Patient/Caregiver Education Details Patient Name: Date of Service: Pulis, Gillermo Murdoch 4/30/2024andnbsp8:15 A M Medical Record Number: [id number] Patient Account Number: [id number] Date of Birth/Gender: Treating RN: [birth year] (37 y.o. Damaris Schooner Primary Care Physician: Eunice Blase Other Clinician: Referring Physician: Treating Physician/Extender:  Fredderick Severance [name]: [phone] Education Assessment Education Provided To: Patient Education Topics Provided Pressure: Mount, ZANDER INGHAM ([id number]) [id number] Page 6 of 13 Methods: Explain/Verbal Responses: Reinforcements  needed, State content correctly Venous: Methods: Explain/Verbal Responses: Reinforcements needed, State content correctly Electronic Signature(s) Signed: 03/22/2023 4:43:48 PM By: Zenaida Deed RN, BSN Entered By: Zenaida Deed on 03/22/2023 08:38:16 -------------------------------------------------------------------------------- Wound Assessment Details Patient Name: Date of Service: Lagman, [name] 03/22/2023 8:15 A M Medical Record Number: [id number] Patient Account Number: [id number] Date of Birth/Sex: Treating RN: April 25, 1986 (37 y.o. Damaris Schooner Primary Care Jahmeek Shirk: [name], [name] Other Clinician: Referring Atharva Mirsky: Treating Briceyda Abdullah/Extender: Benjamine Mola, [name]: 376 Wound Status Wound Number: [id number] Primary Etiology: Venous Leg Ulcer Wound Location: Right, Dorsal Foot Wound Status: Open Wounding Event: Gradually Appeared Comorbid History: Sleep Apnea, Hypertension, Paraplegia Date Acquired: 03/27/2021 Weeks Of Treatment: 103 Clustered Wound: No Photos Wound Measurements Length: (cm) 1.4 Width: (cm) 3.5 Depth: (cm) 0.1 Area: (cm) 3.848 Volume: (cm) 0.385 % Reduction in Area: -104.1% % Reduction in Volume: -104.8% Epithelialization: Small (1-33%) Tunneling: No Undermining: No Wound Description Classification: Full Thickness Without Exposed Su Wound Margin: Flat and Intact Exudate Amount: Medium Exudate Type: Serosanguineous Exudate Color: red, brown pport Structures Wound Bed Granulation Amount: Medium (34-66%) Exposed Structure Granulation Quality: Pink Fascia Exposed: No Necrotic Amount: Medium (34-66%) Fat Layer (Subcutaneous Tissue) Exposed:  Yes Tendon Exposed: No Muscle Exposed: No Joint Exposed: No Bone Exposed: No Periwound Skin Texture Texture Color No Abnormalities Noted: Yes No Abnormalities Noted: Yes Pulice, Josedejesus E ([id number]) [id number] Page 7 of 13 Moisture Temperature / Pain No Abnormalities Noted: No Temperature: No Abnormality Dry / Scaly: Yes Treatment Notes Wound #52 (Foot) Wound Laterality: Dorsal, Right Cleanser Soap and Water Discharge Instruction: May shower and wash wound with dial antibacterial soap and water prior to dressing change. Wound Cleanser Discharge Instruction: Cleanse the wound with wound cleanser prior to applying a clean dressing using gauze sponges, not tissue or cotton balls. Peri-Wound Care Sween Lotion (Moisturizing lotion) Discharge Instruction: Apply Aquaphor moisturizing lotion as directed Topical Mupirocin Ointment Discharge Instruction: Apply Mupirocin (Bactroban) as instructed Primary Dressing Maxorb Extra Ag+ Alginate Dressing, 2x2 (in/in) Discharge Instruction: Apply to wound bed as instructed Secondary Dressing Woven Gauze Sponge, Non-Sterile 4x4 in Discharge Instruction: Apply over primary dressing as directed. Secured With [employer], 4.5x3.1 (in/yd) Discharge Instruction: Secure with Kerlix as directed. 83M Medipore H Soft Cloth Surgical T ape, 4 x 10 (in/yd) Discharge Instruction: Secure with tape as directed. Compression Wrap Compression Stockings Add-Ons Electronic Signature(s) Signed: 03/22/2023 4:43:48 PM By: Zenaida Deed RN, BSN Entered By: Zenaida Deed on 03/22/2023 08:43:17 -------------------------------------------------------------------------------- Wound Assessment Details Patient Name: Date of Service: Theilen, [name] 03/22/2023 8:15 A M Medical Record Number: [id number] Patient Account Number: [id number] Date of Birth/Sex: Treating RN: 08/01/1986 (37 y.o. Damaris Schooner Primary Care Euriah Matlack:  [name], [name] Other Clinician: Referring Aiana Nordquist: Treating Maham Quintin/Extender: Benjamine Mola, [name]: 376 Wound Status Wound Number: 56 Primary Etiology: Neuropathic Ulcer-Non Diabetic Wound Location: Left, Dorsal Foot Wound Status: Open Wounding Event: Gradually Appeared Comorbid History: Sleep Apnea, Hypertension, Paraplegia Date Acquired: 07/11/2021 Weeks Of Treatment: 87 Clustered Wound: Yes Photos Conti, Deiontae E ([id number]) [id number] Page 8 of 13 Wound Measurements Length: (cm) 1.8 Width: (cm) 3 Depth: (cm) 0.1 Area: (cm) 4.241 Volume: (cm) 0.424 % Reduction in Area: 31.8% % Reduction in Volume: 31.8% Epithelialization: Small (1-33%) Tunneling: No Undermining: No Wound Description Classification: Full Thickness Without Exposed Support Structures Wound Margin: Flat and Intact Exudate Amount: Medium Exudate Type: Serosanguineous Exudate Color: red, brown Foul Odor After Cleansing: No Slough/Fibrino Yes Wound Bed Granulation Amount: Medium (34-66%)  Exposed Structure Granulation Quality: Red Fascia Exposed: No Necrotic Amount: Medium (34-66%) Fat Layer (Subcutaneous Tissue) Exposed: Yes Necrotic Quality: Adherent Slough Tendon Exposed: No Muscle Exposed: No Joint Exposed: No Bone Exposed: No Periwound Skin Texture Texture Color No Abnormalities Noted: Yes No Abnormalities Noted: No Erythema: No Moisture No Abnormalities Noted: No Temperature / Pain Dry / Scaly: Yes Temperature: No Abnormality Maceration: No Treatment Notes Wound #56 (Foot) Wound Laterality: Dorsal, Left Cleanser Soap and Water Discharge Instruction: May shower and wash wound with dial antibacterial soap and water prior to dressing change. Wound Cleanser Discharge Instruction: Cleanse the wound with wound cleanser prior to applying a clean dressing using gauze sponges, not tissue or cotton balls. Peri-Wound Care Sween Lotion  (Moisturizing lotion) Discharge Instruction: Apply Aquaphor moisturizing lotion as directed Topical Mupirocin Ointment Discharge Instruction: Apply Mupirocin (Bactroban) as instructed Primary Dressing Maxorb Extra Ag+ Alginate Dressing, 2x2 (in/in) Discharge Instruction: Apply to wound bed as instructed Secondary Dressing Woven Gauze Sponge, Non-Sterile 4x4 in Discharge Instruction: Apply over primary dressing as directed. Secured With [employer], 4.5x3.1 (in/yd) Discharge Instruction: Secure with Kerlix as directed. Golphin, BRAYDYN SCHULTES ([id number]) [id number] Page 9 of 13 60M Medipore H Soft Cloth Surgical T ape, 4 x 10 (in/yd) Discharge Instruction: Secure with tape as directed. Compression Wrap Compression Stockings Add-Ons Electronic Signature(s) Signed: 03/22/2023 4:43:48 PM By: Zenaida Deed RN, BSN Entered By: Zenaida Deed on 03/22/2023 08:42:56 -------------------------------------------------------------------------------- Wound Assessment Details Patient Name: Date of Service: Cronin, [name] 03/22/2023 8:15 A M Medical Record Number: [id number] Patient Account Number: [id number] Date of Birth/Sex: Treating RN: October 05, 1986 (37 y.o. Damaris Schooner Primary Care Blandon Offerdahl: [name], [name] Other Clinician: Referring Akia Desroches: Treating Janeya Deyo/Extender: Benjamine Mola, [name]: 376 Wound Status Wound Number: 67 Primary Etiology: Venous Leg Ulcer Wound Location: Left, Lateral Lower Leg Wound Status: Open Wounding Event: Gradually Appeared Comorbid History: Sleep Apnea, Hypertension, Paraplegia Date Acquired: 06/22/2022 Weeks Of Treatment: 38 Clustered Wound: No Photos Wound Measurements Length: (cm) 0.5 Width: (cm) 0.3 Depth: (cm) 0.1 Area: (cm) 0.118 Volume: (cm) 0.012 % Reduction in Area: 97.9% % Reduction in Volume: 97.9% Epithelialization: Large (67-100%) Tunneling: No Undermining: No Wound  Description Classification: Full Thickness Without Exposed Suppor Wound Margin: Distinct, outline attached Exudate Amount: Small Exudate Type: Serosanguineous Exudate Color: red, brown t Structures Foul Odor After Cleansing: No Slough/Fibrino No Wound Bed Granulation Amount: Large (67-100%) Exposed Structure Granulation Quality: Red Fascia Exposed: No Necrotic Amount: Small (1-33%) Fat Layer (Subcutaneous Tissue) Exposed: Yes Necrotic Quality: Adherent Slough Tendon Exposed: No Muscle Exposed: No Joint Exposed: No Bone Exposed: No Periwound Skin Texture Texture Color No Abnormalities Noted: Yes No Abnormalities Noted: No Erythema: No Kreiser, Jestin E ([id number]) [id number] Page 10 of 13 Erythema: No Moisture No Abnormalities Noted: No Temperature / Pain Dry / Scaly: Yes Temperature: No Abnormality Treatment Notes Wound #67 (Lower Leg) Wound Laterality: Left, Lateral Cleanser Soap and Water Discharge Instruction: May shower and wash wound with dial antibacterial soap and water prior to dressing change. Wound Cleanser Discharge Instruction: Cleanse the wound with wound cleanser prior to applying a clean dressing using gauze sponges, not tissue or cotton balls. Peri-Wound Care Sween Lotion (Moisturizing lotion) Discharge Instruction: Apply Aquaphor moisturizing lotion as directed Topical Mupirocin Ointment Discharge Instruction: Apply Mupirocin (Bactroban) as instructed Primary Dressing Maxorb Extra Ag+ Alginate Dressing, 2x2 (in/in) Discharge Instruction: Apply to wound bed as instructed Secondary Dressing Woven Gauze Sponge, Non-Sterile 4x4 in Discharge Instruction: Apply over primary dressing as directed. Secured With  [employer] Roll Sterile, 4.5x3.1 (in/yd) Discharge Instruction: Secure with Kerlix as directed. 57M Medipore H Soft Cloth Surgical T ape, 4 x 10 (in/yd) Discharge Instruction: Secure with tape as directed. Compression  Wrap Compression Stockings Add-Ons Electronic Signature(s) Signed: 03/22/2023 4:43:48 PM By: Zenaida Deed RN, BSN Entered By: Zenaida Deed on 03/22/2023 08:43:54 -------------------------------------------------------------------------------- Wound Assessment Details Patient Name: Date of Service: Cordova, [name] 03/22/2023 8:15 A M Medical Record Number: [id number] Patient Account Number: [id number] Date of Birth/Sex: Treating RN: November 15, 1986 (37 y.o. Damaris Schooner Primary Care Allysson Rinehimer: [name], [name] Other Clinician: Referring Amadu Schlageter: Treating Fredrika Canby/Extender: Benjamine Mola, [name]: 376 Wound Status Wound Number: 74 Primary Etiology: Pressure Ulcer Wound Location: Left, Posterior Upper Leg Wound Status: Open Wounding Event: Pressure Injury Comorbid History: Sleep Apnea, Hypertension, Paraplegia Date Acquired: 01/10/2023 Weeks Of Treatment: 9 Clustered Wound: No Photos Miley, Dian E ([id number]) [id number] Page 11 of 13 Wound Measurements Length: (cm) 2.1 Width: (cm) 1.3 Depth: (cm) 0.1 Area: (cm) 2.144 Volume: (cm) 0.214 % Reduction in Area: 97.1% % Reduction in Volume: 97.1% Epithelialization: Small (1-33%) Tunneling: No Undermining: No Wound Description Classification: Category/Stage III Wound Margin: Flat and Intact Exudate Amount: Medium Exudate Type: Serosanguineous Exudate Color: red, brown Foul Odor After Cleansing: No Slough/Fibrino No Wound Bed Granulation Amount: Large (67-100%) Exposed Structure Granulation Quality: Red, Friable Fascia Exposed: No Necrotic Amount: None Present (0%) Fat Layer (Subcutaneous Tissue) Exposed: Yes Tendon Exposed: No Muscle Exposed: No Joint Exposed: No Bone Exposed: No Periwound Skin Texture Texture Color No Abnormalities Noted: No No Abnormalities Noted: Yes Scarring: Yes Temperature / Pain Temperature: No Abnormality Moisture No Abnormalities  Noted: Yes Treatment Notes Wound #74 (Upper Leg) Wound Laterality: Left, Posterior Cleanser Vashe 5.8 (oz) Discharge Instruction: Cleanse the wound with Vashe prior to applying a clean dressing using gauze sponges , let sit on wound for 10 minutes Peri-Wound Care Zinc Oxide Ointment 30g tube Discharge Instruction: Apply Zinc Oxide to periwound with each dressing change as needed fpr moisture Topical Keystone antibiotic compound Primary Dressing Maxorb Extra Ag+ Alginate Dressing, 4x4.75 (in/in) Discharge Instruction: Apply to wound bed as instructed Secondary Dressing Zetuvit Plus Silicone Border Sacrum Dressing, Sm, 7x7 (in/in) Discharge Instruction: Apply silicone border over primary dressing as directed. Secured With 57M Medipore H Soft Cloth Surgical T ape, 4 x 10 (in/yd) Discharge Instruction: Secure with tape as directed. Compression Wrap Compression Stockings [place], [place] E ([id number]) [phone].pdf Page 12 of 13 Add-Ons Electronic Signature(s) Signed: 03/22/2023 4:43:48 PM By: Zenaida Deed RN, BSN Entered By: Zenaida Deed on 03/22/2023 08:44:37 -------------------------------------------------------------------------------- Wound Assessment Details Patient Name: Date of Service: Beil, [name] 03/22/2023 8:15 A M Medical Record Number: [id number] Patient Account Number: [id number] Date of Birth/Sex: Treating RN: 1986-04-15 (36 y.o. Damaris Schooner Primary Care Ysidro Ramsay: [name], [name] Other Clinician: Referring Pantera Winterrowd: Treating Laveta Gilkey/Extender: Benjamine Mola, [name]: 376 Wound Status Wound Number: [id number] Primary Etiology: Venous Leg Ulcer Wound Location: Right, Lateral Lower Leg Wound Status: Healed - Epithelialized Wounding Event: Pressure Injury Comorbid History: Sleep Apnea, Hypertension, Paraplegia Date Acquired: 02/08/2023 Weeks Of Treatment: 5 Clustered Wound: No Photos Wound Measurements Length:  (cm) Width: (cm) Depth: (cm) Area: (cm) Volume: (cm) 0 % Reduction in Area: 100% 0 % Reduction in Volume: 100% 0 Epithelialization: Large (67-100%) 0 Tunneling: No 0 Undermining: No Wound Description Classification: Full Thickness Without Exposed Support Structures Exudate Amount: None Present Foul Odor After Cleansing: No Slough/Fibrino No Wound Bed Granulation Amount: None Present (0%) Exposed Structure  Necrotic Amount: None Present (0%) Fascia Exposed: No Fat Layer (Subcutaneous Tissue) Exposed: No Tendon Exposed: No Muscle Exposed: No Joint Exposed: No Bone Exposed: No Periwound Skin Texture Texture Color No Abnormalities Noted: Yes No Abnormalities Noted: Yes Moisture Temperature / Pain No Abnormalities Noted: Yes Temperature: No Abnormality Electronic Signature(s) Signed: 03/22/2023 4:43:48 PM By: Zenaida Deed RN, BSN Entered By: Zenaida Deed on 03/22/2023 08:45:18 Payment, [name] ([id number]) [id number] Page 13 of 13 -------------------------------------------------------------------------------- Vitals Details Patient Name: Date of Service: Kimberling, [name] 03/22/2023 8:15 A M Medical Record Number: [id number] Patient Account Number: [id number] Date of Birth/Sex: Treating RN: August 01, 1986 (37 y.o. M) Primary Care Clois Montavon: [name], [name] Other Clinician: Referring Jaelin Devincentis: Treating Valentine Kuechle/Extender: Benjamine Mola, [name]: 376 Vital Signs Time Taken: 08:06 Temperature (F): 97.9 Height (in): 70 Pulse (bpm): 118 Weight (lbs): 216 Respiratory Rate (breaths/min): 20 Body Mass Index (BMI): 31 Blood Pressure (mmHg): 137/74 Reference Range: 80 - 120 mg / dl Electronic Signature(s) Signed: 03/22/2023 4:43:48 PM By: Zenaida Deed RN, BSN Entered By: Zenaida Deed on 03/22/2023 08:13:16

## 2023-04-05 ENCOUNTER — Ambulatory Visit (HOSPITAL_BASED_OUTPATIENT_CLINIC_OR_DEPARTMENT_OTHER): Payer: BC Managed Care – PPO | Admitting: General Surgery

## 2023-04-13 ENCOUNTER — Ambulatory Visit (HOSPITAL_BASED_OUTPATIENT_CLINIC_OR_DEPARTMENT_OTHER): Payer: BC Managed Care – PPO | Admitting: General Surgery

## 2023-04-14 ENCOUNTER — Encounter (HOSPITAL_BASED_OUTPATIENT_CLINIC_OR_DEPARTMENT_OTHER): Payer: BC Managed Care – PPO | Attending: General Surgery | Admitting: General Surgery

## 2023-04-14 DIAGNOSIS — I872 Venous insufficiency (chronic) (peripheral): Secondary | ICD-10-CM | POA: Diagnosis not present

## 2023-04-14 DIAGNOSIS — L97529 Non-pressure chronic ulcer of other part of left foot with unspecified severity: Secondary | ICD-10-CM | POA: Insufficient documentation

## 2023-04-14 DIAGNOSIS — L97512 Non-pressure chronic ulcer of other part of right foot with fat layer exposed: Secondary | ICD-10-CM | POA: Diagnosis not present

## 2023-04-14 DIAGNOSIS — L98419 Non-pressure chronic ulcer of buttock with unspecified severity: Secondary | ICD-10-CM | POA: Insufficient documentation

## 2023-04-14 DIAGNOSIS — L97822 Non-pressure chronic ulcer of other part of left lower leg with fat layer exposed: Secondary | ICD-10-CM | POA: Diagnosis not present

## 2023-04-14 DIAGNOSIS — I87333 Chronic venous hypertension (idiopathic) with ulcer and inflammation of bilateral lower extremity: Secondary | ICD-10-CM | POA: Diagnosis not present

## 2023-04-14 DIAGNOSIS — G6289 Other specified polyneuropathies: Secondary | ICD-10-CM | POA: Diagnosis not present

## 2023-04-14 DIAGNOSIS — L97812 Non-pressure chronic ulcer of other part of right lower leg with fat layer exposed: Secondary | ICD-10-CM | POA: Diagnosis not present

## 2023-04-14 DIAGNOSIS — L97522 Non-pressure chronic ulcer of other part of left foot with fat layer exposed: Secondary | ICD-10-CM | POA: Diagnosis not present

## 2023-04-28 ENCOUNTER — Encounter (HOSPITAL_BASED_OUTPATIENT_CLINIC_OR_DEPARTMENT_OTHER): Payer: BC Managed Care – PPO | Attending: General Surgery | Admitting: General Surgery

## 2023-04-28 DIAGNOSIS — L97522 Non-pressure chronic ulcer of other part of left foot with fat layer exposed: Secondary | ICD-10-CM | POA: Diagnosis not present

## 2023-04-28 DIAGNOSIS — L89893 Pressure ulcer of other site, stage 3: Secondary | ICD-10-CM | POA: Diagnosis not present

## 2023-04-28 DIAGNOSIS — L97812 Non-pressure chronic ulcer of other part of right lower leg with fat layer exposed: Secondary | ICD-10-CM | POA: Diagnosis not present

## 2023-04-28 DIAGNOSIS — L97512 Non-pressure chronic ulcer of other part of right foot with fat layer exposed: Secondary | ICD-10-CM | POA: Insufficient documentation

## 2023-04-28 DIAGNOSIS — L97529 Non-pressure chronic ulcer of other part of left foot with unspecified severity: Secondary | ICD-10-CM | POA: Insufficient documentation

## 2023-04-28 DIAGNOSIS — L97822 Non-pressure chronic ulcer of other part of left lower leg with fat layer exposed: Secondary | ICD-10-CM | POA: Diagnosis not present

## 2023-04-28 DIAGNOSIS — L98419 Non-pressure chronic ulcer of buttock with unspecified severity: Secondary | ICD-10-CM | POA: Insufficient documentation

## 2023-04-28 DIAGNOSIS — I872 Venous insufficiency (chronic) (peripheral): Secondary | ICD-10-CM | POA: Diagnosis not present

## 2023-04-28 DIAGNOSIS — G6289 Other specified polyneuropathies: Secondary | ICD-10-CM | POA: Diagnosis not present

## 2023-04-28 DIAGNOSIS — I87333 Chronic venous hypertension (idiopathic) with ulcer and inflammation of bilateral lower extremity: Secondary | ICD-10-CM | POA: Diagnosis not present

## 2023-05-05 DIAGNOSIS — R350 Frequency of micturition: Secondary | ICD-10-CM | POA: Diagnosis not present

## 2023-05-05 DIAGNOSIS — Z79899 Other long term (current) drug therapy: Secondary | ICD-10-CM | POA: Diagnosis not present

## 2023-05-05 DIAGNOSIS — M62838 Other muscle spasm: Secondary | ICD-10-CM | POA: Diagnosis not present

## 2023-05-05 DIAGNOSIS — I10 Essential (primary) hypertension: Secondary | ICD-10-CM | POA: Diagnosis not present

## 2023-05-10 ENCOUNTER — Encounter (HOSPITAL_BASED_OUTPATIENT_CLINIC_OR_DEPARTMENT_OTHER): Payer: BC Managed Care – PPO | Admitting: General Surgery

## 2023-05-10 DIAGNOSIS — L97822 Non-pressure chronic ulcer of other part of left lower leg with fat layer exposed: Secondary | ICD-10-CM | POA: Diagnosis not present

## 2023-05-10 DIAGNOSIS — L98419 Non-pressure chronic ulcer of buttock with unspecified severity: Secondary | ICD-10-CM | POA: Diagnosis not present

## 2023-05-10 DIAGNOSIS — L97512 Non-pressure chronic ulcer of other part of right foot with fat layer exposed: Secondary | ICD-10-CM | POA: Diagnosis not present

## 2023-05-10 DIAGNOSIS — L97529 Non-pressure chronic ulcer of other part of left foot with unspecified severity: Secondary | ICD-10-CM | POA: Diagnosis not present

## 2023-05-10 DIAGNOSIS — L97522 Non-pressure chronic ulcer of other part of left foot with fat layer exposed: Secondary | ICD-10-CM | POA: Diagnosis not present

## 2023-05-10 DIAGNOSIS — G6289 Other specified polyneuropathies: Secondary | ICD-10-CM | POA: Diagnosis not present

## 2023-05-10 DIAGNOSIS — I872 Venous insufficiency (chronic) (peripheral): Secondary | ICD-10-CM | POA: Diagnosis not present

## 2023-05-10 NOTE — Progress Notes (Signed)
[name] ([id number]) [id number] Page 1 of 15 Visit Report for 05/10/2023 Arrival Information Details Patient Name: Date of Service: [name], [name] 05/10/2023 7:30 [name] Medical Record Number: [id number] Patient Account Number: [id number] Date of Birth/Sex: Treating RN: 01-01-1986 (37 y.o. Damaris Schooner Primary Care Solaris Kram: [name], [name] Other Clinician: Referring Dema Timmons: Treating Shalane [name]/Extender: Fredderick Severance [name]: 383 Visit Information History Since Last Visit Added or deleted any medications: No Patient Arrived: Wheel Chair Any new allergies or adverse reactions: No Arrival Time: 07:35 Had [name] fall or experienced change in No Accompanied By: self activities of daily living that may affect Transfer Assistance: None risk of falls: Patient Identification Verified: Yes Signs or symptoms of abuse/neglect since last visito No Secondary Verification Process Completed: Yes Hospitalized since last visit: No Patient Requires Transmission-Based Precautions: No Implantable device outside of the clinic excluding No Patient Has Alerts: Yes cellular tissue based products placed in the center Patient Alerts: R ABI = 1.0 since last visit: L ABI = 1.1 Has Dressing in Place as Prescribed: Yes Has Compression in Place as Prescribed: Yes Pain Present Now: No Electronic Signature(s) Signed: 05/10/2023 4:43:15 PM By: Zenaida Deed RN, BSN Entered By: Zenaida Deed on 05/10/2023 07:37:18 -------------------------------------------------------------------------------- Encounter Discharge Information Details Patient Name: Date of Service: [name], [name] 05/10/2023 7:30 [name] Medical Record Number: [id number] Patient Account Number: [id number] Date of Birth/Sex: Treating RN: 12-11-1985 (37 y.o. Damaris Schooner Primary Care Marisabel Macpherson: [name], [name] Other Clinician: Referring Blair Lundeen: Treating Nikia Mangino/Extender: Fredderick Severance [name]: [phone] Encounter Discharge Information Items Post Procedure Vitals Discharge Condition: Stable Temperature (F): 98.2 Ambulatory Status: Ambulatory Pulse (bpm): 112 Discharge Destination: Home Respiratory Rate (breaths/min): 18 Transportation: Private Auto Blood Pressure (mmHg): 112/73 Accompanied By: self Schedule Follow-up Appointment: Yes Clinical Summary of Care: Patient Declined Electronic Signature(s) Signed: 05/10/2023 4:43:15 PM By: Zenaida Deed RN, BSN Entered By: Zenaida Deed on 05/10/2023 09:04:23 [name] ([id number]) [id number] Page 2 of 15 -------------------------------------------------------------------------------- Lower Extremity Assessment Details Patient Name: Date of Service: [name], [name] 05/10/2023 7:30 [name] Medical Record Number: [id number] Patient Account Number: [id number] Date of Birth/Sex: Treating RN: [birth year] (37 y.o. Damaris Schooner Primary Care Lachlyn Vanderstelt: [name], [name] Other Clinician: Referring Crist Kruszka: Treating Viney Acocella/Extender: Benjamine Mola, [name]: 383 Edema Assessment Assessed: [Left: No] [Right: No] Edema: [Left: Yes] [Right: Yes] Calf Left: Right: Point of Measurement: 33 cm From Medial Instep 29.5 cm 34 cm Ankle Left: Right: Point of Measurement: 10 cm From Medial Instep 26 cm 25 cm Vascular Assessment Pulses: Dorsalis Pedis Palpable: [Left:Yes] [Right:Yes] Electronic Signature(s) Signed: 05/10/2023 4:43:15 PM By: Zenaida Deed RN, BSN Entered By: Zenaida Deed on 05/10/2023 07:45:00 -------------------------------------------------------------------------------- Multi Wound Chart Details Patient Name: Date of Service: [name], [name] 05/10/2023 7:30 [name] Medical Record Number: [id number] Patient Account Number: [id number] Date of Birth/Sex: Treating RN: 06-Apr-1986 (37 y.o. 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 Primary Etiology: Sleep Apnea, Hypertension, Paraplegia Sleep Apnea, Hypertension, Paraplegia Sleep Apnea, Hypertension, Paraplegia Comorbid History: 03/27/2021 07/11/2021 06/22/2022 Date Acquired: 110  94 45 Weeks of Treatment: Open Open Open Wound Status: No No No Wound Recurrence: No Yes No Clustered Wound: [name] [name] [name] Clustered Quantity: 1x4.1x0.1 1.8x1.9x0.1 1.2x0.4x0.1 Measurements L x W x D (cm) 3.22 2.686 0.377 [name] (cm) : rea 0.322 0.269 0.038 Volume (cm) : -70.80% 56.80% 93.30% % Reduction in [name] rea: -71.30% 56.80% 93.20% % Reduction in Volume: Full Thickness Without Exposed Full Thickness Without Exposed Full Thickness Without Exposed Classification: Support Structures Support Structures Support Structures Medium Medium Small Exudate [name] mount: Serosanguineous Serosanguineous Serous Exudate Type: red, brown red, brown amber Exudate Color: Flat and Intact Flat and Intact Distinct, outline attached Wound Margin: Small (1-33%) Small (1-33%) Small (1-33%) Granulation [name] mount: Pink Red, Pink Pink Granulation Quality: Large (67-100%) Large (67-100%) None Present (0%) Necrotic [name] mount: Fat Layer (Subcutaneous Tissue): Yes Fat Layer (Subcutaneous Tissue): Yes Fat Layer (Subcutaneous Tissue): Yes Exposed Structures: Fascia: No Fascia: No Fascia: No Tendon: No Tendon: No Tendon: No Muscle:  No Muscle: No Muscle: No Joint: No Joint: No Joint: No Bone: No Bone: No Bone: No Small (1-33%) Small (1-33%) Large (67-100%) Epithelialization: Debridement - Excisional Debridement - Excisional Debridement - Selective/Open Wound Debridement: Pre-procedure Verification/Time Out 08:05 08:05 08:05 Taken: Subcutaneous, Slough Subcutaneous, Ambulance person, [employer] Tissue Debrided: Skin/Subcutaneous Tissue Skin/Subcutaneous Tissue Non-Viable Tissue Level: 3.22 2.68 0.38 Debridement [name] (sq cm): rea Curette Curette Curette Instrument: Minimum Minimum Minimum Bleeding: Pressure Pressure Pressure Hemostasis [name] chieved: Insensate Insensate Insensate Procedural Pain: Insensate Insensate Insensate Post Procedural Pain: Procedure was tolerated well Procedure was tolerated well Procedure was tolerated well Debridement Treatment Response: 1x4.1x0.1 1.8x1.9x0.1 1.2x0.4x0.1 Post Debridement Measurements L x W x D (cm) 0.322 0.269 0.038 Post Debridement Volume: (cm) No Abnormalities Noted No Abnormalities Noted No Abnormalities Noted Periwound Skin Texture: Dry/Scaly: Yes Dry/Scaly: Yes Dry/Scaly: Yes Periwound Skin Moisture: Maceration: No Erythema: No Rubor: Yes Erythema: No Periwound Skin Color: Erythema: No No Abnormality No Abnormality No Abnormality Temperature: Debridement Debridement Debridement Procedures Performed: Wound Number: [id number] Photos: Left, Posterior Upper Leg Right, Lateral Lower Leg Right, Plantar Foot Wound Location: Pressure Injury Pressure Injury Gradually Appeared Wounding Event: Pressure Ulcer Venous Leg Ulcer Neuropathic Ulcer-Non Diabetic Primary Etiology: Sleep Apnea, Hypertension, Paraplegia Sleep Apnea, Hypertension, Paraplegia Sleep Apnea, Hypertension, Paraplegia Comorbid History: 01/10/2023 02/08/2023 04/27/2023 Date Acquired: 16 12 1  Weeks of Treatment: Open Open Open Wound Status: No Yes No Wound Recurrence: Yes No  No Clustered Wound: 3 [name] [name] Clustered Quantity: 10x5x0.1 0x0x0 0.1x0.1x0.1 Measurements L x W x D (cm) 39.27 0 0.008 [name] (cm) : rea 3.927 0 0.001 Volume (cm) : 47.40% 100.00% 97.70% % Reduction in [name] rea: 47.40% 100.00% 97.10% % Reduction in Volume: Category/Stage III Full Thickness Without Exposed Full Thickness Without Exposed Classification: Support Structures Support Structures Medium None Present Small Exudate Amount: Serosanguineous [name] Serous Exudate Type: red, brown [name] amber Exudate Color: Mcginty, Toran E ([id number]) [id number] Page 4 of 15 Flat and Intact Flat and Intact Flat and Intact Wound Margin: Large (67-100%) None Present (0%) Large (67-100%) Granulation Amount: Red [name] Pink Granulation Quality: Small (1-33%) None Present (0%) None Present (0%) Necrotic Amount: Fat Layer (Subcutaneous Tissue): Yes Fat Layer (Subcutaneous Tissue): Yes Fat Layer (Subcutaneous Tissue): Yes Exposed Structures: Fascia: No Fascia: No Fascia: No Tendon: No Tendon: No Tendon: No Muscle: No Muscle: No Muscle: No Joint: No Joint: No Joint: No Bone: No Bone: No Bone: No Medium (34-66%) Large (67-100%) Large (67-100%) Epithelialization: [name] [name] [name] Debridement: [name] [name] [name] Tissue Debrided: [name] [name] [name]  Level: [name] [name] [name] Debridement [name] (sq cm): rea [name] [name] [name] Instrument: [name] [name] [name] Bleeding: [name] [name] [name] Hemostasis [name] chieved: [name] [name] [name] Procedural Pain: [name] [name] [name] Post Procedural Pain: Debridement Treatment Response: [name] [name] [name] Post Debridement Measurements L x [name] [name] [name] W x D (cm) [name] [name] [name] Post Debridement Volume: (cm) Scarring: Yes No Abnormalities Noted No Abnormalities Noted Periwound Skin Texture: Dry/Scaly: Yes Dry/Scaly: Yes Maceration: No Periwound Skin Moisture: No Abnormalities Noted No Abnormalities Noted Rubor: Yes Periwound Skin Color: No Abnormality No Abnormality No Abnormality Temperature: [name] [name]  [name] Procedures Performed: Treatment Notes Electronic Signature(s) Signed: 05/10/2023 8:16:54 AM By: Duanne Guess MD FACS Entered By: Duanne Guess on 05/10/2023 08:16:54 -------------------------------------------------------------------------------- Multi-Disciplinary Care Plan Details Patient Name: Date of Service: Slezak, [name] 05/10/2023 7:30 [name] Medical Record Number: [id number] Patient Account Number: [id number] Date of Birth/Sex: Treating RN: 03-08-1986 (37 y.o. Damaris Schooner Primary Care Audrinna Sherman: [name], [name] Other Clinician: Referring Hero Mccathern: Treating Said Rueb/Extender: Fredderick Severance [name]: 383 Multidisciplinary Care Plan reviewed with physician Active Inactive Pressure Nursing Diagnoses: Knowledge deficit related to management of pressures ulcers Potential for impaired tissue integrity related to pressure, friction, moisture, and shear Goals: Patient will remain free from development of additional pressure ulcers Date Initiated: 06/21/2018 Date Inactivated: 02/12/2019 Target Resolution Date: 02/16/2019 Unmet Reason: pressure ulcer Goal Status: Unmet reopened on left ischium Patient/caregiver will verbalize risk factors for pressure ulcer development Date Initiated: 06/21/2018 Date Inactivated: 05/28/2019 Target Resolution Date: 06/01/2019 Goal Status: Met Patient/caregiver will verbalize understanding of pressure ulcer management Date Initiated: 06/01/2022 Target Resolution Date: 06/09/2023 Goal Status: Active Interventions: [name], [name] ([id number]) [id number] Page 5 of 15 Assess: immobility, friction, shearing, incontinence upon admission and as needed Assess offloading mechanisms upon admission and as needed Assess potential for pressure ulcer upon admission and as needed Treatment Activities: Pressure reduction/relief device ordered : 12/05/2017 Notes: Wound/Skin Impairment Nursing  Diagnoses: Impaired tissue integrity Knowledge deficit related to ulceration/compromised skin integrity Goals: Patient/caregiver will verbalize understanding of skin care regimen Date Initiated: 06/01/2022 Target Resolution Date: 06/09/2023 Goal Status: Active Ulcer/skin breakdown will have [name] volume reduction of 30% by week 4 Date Initiated: 06/01/2022 Date Inactivated: 06/29/2022 Target Resolution Date: 06/29/2022 Unmet Reason: complex wounds, Goal Status: Unmet infection Interventions: Assess patient/caregiver ability to obtain necessary supplies Assess ulceration(s) every visit Provide education on ulcer and skin care Notes: 02/02/21: Complex Care, ongoing. Electronic Signature(s) Signed: 05/10/2023 4:43:15 PM By: Zenaida Deed RN, BSN Entered By: Zenaida Deed on 05/10/2023 08:08:19 -------------------------------------------------------------------------------- Pain Assessment Details Patient Name: Date of Service: Sultan, [name] 05/10/2023 7:30 [name] Medical Record Number: [id number] Patient Account Number: [id number] Date of Birth/Sex: Treating RN: [birth year] (37 y.o. Damaris Schooner Primary Care Winnifred Dufford: [name], [name] Other Clinician: Referring Evon Dejarnett: Treating Franny Selvage/Extender: Fredderick Severance [name]: 383 Active Problems Location of Pain Severity and Description of Pain Patient Has Paino No Site Locations Rate the pain. Current Pain Level: 0 [name], [name] ([id number]) [phone].pdf Page 6 of 15 Pain Management and Medication Current Pain Management: Electronic Signature(s) Signed: 05/10/2023 4:43:15 PM By: Zenaida Deed RN, BSN Entered By: Zenaida Deed on 05/10/2023 07:37:31 -------------------------------------------------------------------------------- Patient/Caregiver Education Details Patient Name: Date of Service: Friis, Gillermo Murdoch 6/18/2024andnbsp7:30 [name] Medical Record Number: [id number] Patient  Account Number: [id number] Date of Birth/Gender: Treating RN: [birth year] (37 y.o. Damaris Schooner Primary Care Physician: Eunice Blase Other Clinician: Referring Physician: Treating Physician/Extender: Benjamine Mola, [name] Weeks in  Treatment: 383 Education Assessment Education Provided To: Patient Education Topics Provided Venous: Methods: Explain/Verbal Responses: Reinforcements needed, State content correctly Wound/Skin Impairment: Methods: Explain/Verbal Responses: Reinforcements needed, State content correctly Electronic Signature(s) Signed: 05/10/2023 4:43:15 PM By: Zenaida Deed RN, BSN Entered By: Zenaida Deed on 05/10/2023 08:09:16 -------------------------------------------------------------------------------- Wound Assessment Details Patient Name: Date of Service: Barse, [name] 05/10/2023 7:30 [name] Medical Record Number: [id number] Patient Account Number: [id number] Date of Birth/Sex: Treating RN: Apr 15, 1986 (37 y.o. Damaris Schooner Primary Care Pranish Akhavan: [name], [name] Other Clinician: Referring Deondrea Markos: Treating Dao Memmott/Extender: Benjamine Mola, [name]: 383 Wound Status Wound Number: 52 Primary Etiology: Venous Leg Ulcer Wound Location: Right, Dorsal Foot Wound Status: Open Wounding Event: Gradually Appeared Comorbid History: Sleep Apnea, Hypertension, Paraplegia Date Acquired: 03/27/2021 Weeks Of Treatment: 110 Clustered Wound: No Photos [name], [name] ([id number]) [id number] Page 7 of 15 Wound Measurements Length: (cm) 1 Width: (cm) 4.1 Depth: (cm) 0.1 Area: (cm) 3.22 Volume: (cm) 0.322 % Reduction in Area: -70.8% % Reduction in Volume: -71.3% Epithelialization: Small (1-33%) Tunneling: No Undermining: No Wound Description Classification: Full Thickness Without Exposed Support Structures Wound Margin: Flat and Intact Exudate Amount: Medium Exudate Type:  Serosanguineous Exudate Color: red, brown Foul Odor After Cleansing: No Slough/Fibrino Yes Wound Bed Granulation Amount: Small (1-33%) Exposed Structure Granulation Quality: Pink Fascia Exposed: No Necrotic Amount: Large (67-100%) Fat Layer (Subcutaneous Tissue) Exposed: Yes Necrotic Quality: Adherent Slough Tendon Exposed: No Muscle Exposed: No Joint Exposed: No Bone Exposed: No Periwound Skin Texture Texture Color No Abnormalities Noted: Yes No Abnormalities Noted: Yes Moisture Temperature / Pain No Abnormalities Noted: Yes Temperature: No Abnormality Treatment Notes Wound #52 (Foot) Wound Laterality: Dorsal, Right Cleanser Soap and Water Discharge Instruction: May shower and wash wound with dial antibacterial soap and water prior to dressing change. Wound Cleanser Discharge Instruction: Cleanse the wound with wound cleanser prior to applying [name] clean dressing using gauze sponges, not tissue or cotton balls. Peri-Wound Care Sween Lotion (Moisturizing lotion) Discharge Instruction: Apply Aquaphor moisturizing lotion as directed Topical Mupirocin Ointment Discharge Instruction: Apply Mupirocin (Bactroban) as instructed Primary Dressing Maxorb Extra Ag+ Alginate Dressing, 2x2 (in/in) Discharge Instruction: Apply to wound bed as instructed Secondary Dressing Woven Gauze Sponge, Non-Sterile 4x4 in Discharge Instruction: Apply over primary dressing as directed. Secured With [employer], 4.5x3.1 (in/yd) Discharge Instruction: Secure with Kerlix as directed. 12M Medipore H Soft Cloth Surgical Tape, 4 x 10 (in/yd) [name], [name] ([id number]) [id number] Page 8 of 15 Discharge Instruction: Secure with tape as directed. Compression Wrap Compression Stockings Add-Ons Electronic Signature(s) Signed: 05/10/2023 4:43:15 PM By: Zenaida Deed RN, BSN Entered By: Zenaida Deed on 05/10/2023  07:56:07 -------------------------------------------------------------------------------- Wound Assessment Details Patient Name: Date of Service: Liles, [name] 05/10/2023 7:30 [name] Medical Record Number: [id number] Patient Account Number: [id number] Date of Birth/Sex: Treating RN: 1986-03-07 (37 y.o. Damaris Schooner Primary Care Ivaan Liddy: [name], [name] Other Clinician: Referring Belmira Daley: Treating Rayya Yagi/Extender: Benjamine Mola, [name]: 383 Wound Status Wound Number: 56 Primary Etiology: Neuropathic Ulcer-Non Diabetic Wound Location: Left, Dorsal Foot Wound Status: Open Wounding Event: Gradually Appeared Comorbid History: Sleep Apnea, Hypertension, Paraplegia Date Acquired: 07/11/2021 Weeks Of Treatment: 94 Clustered Wound: Yes Photos Wound Measurements Length: (cm) 1.8 Width: (cm) 1.9 Depth: (cm) 0.1 Area: (cm) 2.686 Volume: (cm) 0.269 % Reduction in Area: 56.8% % Reduction in Volume: 56.8% Epithelialization: Small (1-33%) Tunneling: No Undermining: No Wound Description Classification: Full Thickness Without Exposed Suppor Wound Margin: Flat and Intact Exudate Amount:  Medium Exudate Type: Serosanguineous Exudate Color: red, brown t Structures Foul Odor After Cleansing: No Slough/Fibrino Yes Wound Bed Granulation Amount: Small (1-33%) Exposed Structure Granulation Quality: Red, Pink Fascia Exposed: No Necrotic Amount: Large (67-100%) Fat Layer (Subcutaneous Tissue) Exposed: Yes Necrotic Quality: Adherent Slough Tendon Exposed: No Muscle Exposed: No Joint Exposed: No Bone Exposed: No [name], [name] ([id number]) [id number] Page 9 of 15 Periwound Skin Texture Texture Color No Abnormalities Noted: Yes No Abnormalities Noted: No Erythema: No Moisture Rubor: Yes No Abnormalities Noted: No Dry / Scaly: Yes Temperature / Pain Maceration: No Temperature: No Abnormality Treatment Notes Wound #56 (Foot)  Wound Laterality: Dorsal, Left Cleanser Soap and Water Discharge Instruction: May shower and wash wound with dial antibacterial soap and water prior to dressing change. Wound Cleanser Discharge Instruction: Cleanse the wound with wound cleanser prior to applying [name] clean dressing using gauze sponges, not tissue or cotton balls. Peri-Wound Care Sween Lotion (Moisturizing lotion) Discharge Instruction: Apply Aquaphor moisturizing lotion as directed Topical Mupirocin Ointment Discharge Instruction: Apply Mupirocin (Bactroban) as instructed Primary Dressing Maxorb Extra Ag+ Alginate Dressing, 2x2 (in/in) Discharge Instruction: Apply to wound bed as instructed Secondary Dressing Woven Gauze Sponge, Non-Sterile 4x4 in Discharge Instruction: Apply over primary dressing as directed. Secured With [employer], 4.5x3.1 (in/yd) Discharge Instruction: Secure with Kerlix as directed. 40M Medipore H Soft Cloth Surgical T ape, 4 x 10 (in/yd) Discharge Instruction: Secure with tape as directed. Compression Wrap Compression Stockings Add-Ons Electronic Signature(s) Signed: 05/10/2023 4:43:15 PM By: Zenaida Deed RN, BSN Entered By: Zenaida Deed on 05/10/2023 07:56:54 -------------------------------------------------------------------------------- Wound Assessment Details Patient Name: Date of Service: Eddleman, [name] 05/10/2023 7:30 [name] Medical Record Number: [id number] Patient Account Number: [id number] Date of Birth/Sex: Treating RN: November 29, 1985 (37 y.o. Damaris Schooner Primary Care Labron Bloodgood: [name], [name] Other Clinician: Referring Lael Wetherbee: Treating Rheagan Nayak/Extender: Benjamine Mola, [name]: 383 Wound Status Wound Number: 67 Primary Etiology: Venous Leg Ulcer Wound Location: Left, Lateral Lower Leg Wound Status: Open Wounding Event: Gradually Appeared Comorbid History: Sleep Apnea, Hypertension, Paraplegia Date Acquired: 06/22/2022 Weeks Of  Treatment: 45 Clustered Wound: No [name], [name] ([id number]) [id number] Page 10 of 15 Photos Wound Measurements Length: (cm) 1.2 Width: (cm) 0.4 Depth: (cm) 0.1 Area: (cm) 0.377 Volume: (cm) 0.038 % Reduction in Area: 93.3% % Reduction in Volume: 93.2% Epithelialization: Large (67-100%) Tunneling: No Undermining: No Wound Description Classification: Full Thickness Without Exposed Support Structures Wound Margin: Distinct, outline attached Exudate Amount: Small Exudate Type: Serous Exudate Color: amber Foul Odor After Cleansing: No Slough/Fibrino No Wound Bed Granulation Amount: Small (1-33%) Exposed Structure Granulation Quality: Pink Fascia Exposed: No Necrotic Amount: None Present (0%) Fat Layer (Subcutaneous Tissue) Exposed: Yes Tendon Exposed: No Muscle Exposed: No Joint Exposed: No Bone Exposed: No Periwound Skin Texture Texture Color No Abnormalities Noted: Yes No Abnormalities Noted: Yes Moisture Temperature / Pain No Abnormalities Noted: No Temperature: No Abnormality Dry / Scaly: Yes Treatment Notes Wound #67 (Lower Leg) Wound Laterality: Left, Lateral Cleanser Soap and Water Discharge Instruction: May shower and wash wound with dial antibacterial soap and water prior to dressing change. Wound Cleanser Discharge Instruction: Cleanse the wound with wound cleanser prior to applying [name] clean dressing using gauze sponges, not tissue or cotton balls. Peri-Wound Care Sween Lotion (Moisturizing lotion) Discharge Instruction: Apply Aquaphor moisturizing lotion as directed Topical Mupirocin Ointment Discharge Instruction: Apply Mupirocin (Bactroban) as instructed Primary Dressing Maxorb Extra Ag+ Alginate Dressing, 2x2 (in/in) Discharge Instruction: Apply to wound bed as instructed Secondary  Dressing Woven Gauze Sponge, Non-Sterile 4x4 in Discharge Instruction: Apply over primary dressing as directed. Secured With JOHAH, MONTELONGO  ([id number]) [id number] Page 11 of 15 Kerlix Roll Sterile, 4.5x3.1 (in/yd) Discharge Instruction: Secure with Kerlix as directed. 28M Medipore H Soft Cloth Surgical T ape, 4 x 10 (in/yd) Discharge Instruction: Secure with tape as directed. Compression Wrap Compression Stockings Add-Ons Electronic Signature(s) Signed: 05/10/2023 4:43:15 PM By: Zenaida Deed RN, BSN Entered By: Zenaida Deed on 05/10/2023 07:58:04 -------------------------------------------------------------------------------- Wound Assessment Details Patient Name: Date of Service: Crigger, [name] 05/10/2023 7:30 [name] Medical Record Number: [id number] Patient Account Number: [id number] Date of Birth/Sex: Treating RN: 1986/06/06 (37 y.o. Damaris Schooner Primary Care Rumaldo Difatta: [name], [name] Other Clinician: Referring Tyreek Clabo: Treating Lynea Rollison/Extender: Benjamine Mola, [name]: 383 Wound Status Wound Number: [id number] Primary Etiology: Pressure Ulcer Wound Location: Left, Posterior Upper Leg Wound Status: Open Wounding Event: Pressure Injury Comorbid History: Sleep Apnea, Hypertension, Paraplegia Date Acquired: 01/10/2023 Weeks Of Treatment: 16 Clustered Wound: Yes Photos Wound Measurements Length: (cm) Width: (cm) Depth: (cm) Clustered Quantity: Area: (cm) Volume: (cm) 10 % Reduction in Area: 47.4% 5 % Reduction in Volume: 47.4% 0.1 Epithelialization: Medium (34-66%) 3 Tunneling: No 39.27 Undermining: No 3.927 Wound Description Classification: Category/Stage III Wound Margin: Flat and Intact Exudate Amount: Medium Exudate Type: Serosanguineous Exudate Color: red, brown Foul Odor After Cleansing: No Slough/Fibrino No Wound Bed Granulation Amount: Large (67-100%) Exposed Structure Granulation Quality: Red Fascia Exposed: No Necrotic Amount: Small (1-33%) Fat Layer (Subcutaneous Tissue) Exposed: Yes Necrotic Quality: Adherent Slough Tendon Exposed:  No [name], [name] ([id number]) [id number] Page 12 of 15 Muscle Exposed: No Joint Exposed: No Bone Exposed: No Periwound Skin Texture Texture Color No Abnormalities Noted: No No Abnormalities Noted: Yes Scarring: Yes Temperature / Pain Temperature: No Abnormality Moisture No Abnormalities Noted: No Dry / Scaly: Yes Treatment Notes Wound #74 (Upper Leg) Wound Laterality: Left, Posterior Cleanser Vashe 5.8 (oz) Discharge Instruction: Cleanse the wound with Vashe prior to applying [name] clean dressing using gauze sponges , let sit on wound for 10 minutes Peri-Wound Care Zinc Oxide Ointment 30g tube Discharge Instruction: Apply Zinc Oxide to periwound with each dressing change as needed fpr moisture Topical Keystone antibiotic compound Primary Dressing Maxorb Extra Ag+ Alginate Dressing, 4x4.75 (in/in) Discharge Instruction: Apply to wound bed as instructed Secondary Dressing Zetuvit Plus Silicone Border Sacrum Dressing, Sm, 7x7 (in/in) Discharge Instruction: Apply silicone border over primary dressing as directed. Secured With 28M Medipore H Soft Cloth Surgical T ape, 4 x 10 (in/yd) Discharge Instruction: Secure with tape as directed. Compression Wrap Compression Stockings Add-Ons Electronic Signature(s) Signed: 05/10/2023 4:43:15 PM By: Zenaida Deed RN, BSN Entered By: Zenaida Deed on 05/10/2023 08:04:32 -------------------------------------------------------------------------------- Wound Assessment Details Patient Name: Date of Service: Dolman, [name] 05/10/2023 7:30 [name] Medical Record Number: [id number] Patient Account Number: [id number] Date of Birth/Sex: Treating RN: 1986-07-03 (37 y.o. Damaris Schooner Primary Care Keelan Tripodi: [name], [name] Other Clinician: Referring Jenee Spaugh: Treating Ingris Pasquarella/Extender: Benjamine Mola, [name]: 383 Wound Status Wound Number: [id number] Primary Etiology: Venous Leg Ulcer Wound  Location: Right, Lateral Lower Leg Wound Status: Open Wounding Event: Pressure Injury Comorbid History: Sleep Apnea, Hypertension, Paraplegia Date Acquired: 02/08/2023 Weeks Of Treatment: 12 Clustered Wound: No Photos [name], [name] ([id number]) [id number] Page 13 of 15 Wound Measurements Length: (cm) Width: (cm) Depth: (cm) Area: (cm) Volume: (cm) 0 % Reduction in Area: 100% 0 % Reduction in Volume: 100% 0 Epithelialization: Large (67-100%) 0 Tunneling:  No 0 Undermining: No Wound Description Classification: Full Thickness Without Exposed Suppor Wound Margin: Flat and Intact Exudate Amount: None Present t Structures Foul Odor After Cleansing: No Slough/Fibrino No Wound Bed Granulation Amount: None Present (0%) Exposed Structure Necrotic Amount: None Present (0%) Fascia Exposed: No Fat Layer (Subcutaneous Tissue) Exposed: Yes Tendon Exposed: No Muscle Exposed: No Joint Exposed: No Bone Exposed: No Periwound Skin Texture Texture Color No Abnormalities Noted: Yes No Abnormalities Noted: Yes Moisture Temperature / Pain No Abnormalities Noted: Yes Temperature: No Abnormality Electronic Signature(s) Signed: 05/10/2023 4:43:15 PM By: Zenaida Deed RN, BSN Entered By: Zenaida Deed on 05/10/2023 07:58:52 -------------------------------------------------------------------------------- Wound Assessment Details Patient Name: Date of Service: [place], [name] 05/10/2023 7:30 [name] Medical Record Number: [id number] Patient Account Number: [id number] Date of Birth/Sex: Treating RN: Apr 28, 1986 (37 y.o. Damaris Schooner Primary Care Janae Bonser: [name], [name] Other Clinician: Referring Dmitri Pettigrew: Treating Abbagale Goguen/Extender: Benjamine Mola, [name]: 383 Wound Status Wound Number: [id number] Primary Etiology: Neuropathic Ulcer-Non Diabetic Wound Location: Right, Plantar Foot Wound Status: Open Wounding Event: Gradually  Appeared Comorbid History: Sleep Apnea, Hypertension, Paraplegia Date Acquired: 04/27/2023 Weeks Of Treatment: 1 Clustered Wound: No Photos Fasig, Anastacio E ([id number]) [id number] Page 14 of 15 Wound Measurements Length: (cm) 0.1 Width: (cm) 0.1 Depth: (cm) 0.1 Area: (cm) 0.008 Volume: (cm) 0.001 % Reduction in Area: 97.7% % Reduction in Volume: 97.1% Epithelialization: Large (67-100%) Tunneling: No Undermining: No Wound Description Classification: Full Thickness Without Exposed Support Structures Wound Margin: Flat and Intact Exudate Amount: Small Exudate Type: Serous Exudate Color: amber Foul Odor After Cleansing: No Slough/Fibrino No Wound Bed Granulation Amount: Large (67-100%) Exposed Structure Granulation Quality: Pink Fascia Exposed: No Necrotic Amount: None Present (0%) Fat Layer (Subcutaneous Tissue) Exposed: Yes Tendon Exposed: No Muscle Exposed: No Joint Exposed: No Bone Exposed: No Periwound Skin Texture Texture Color No Abnormalities Noted: Yes No Abnormalities Noted: No Rubor: Yes Moisture No Abnormalities Noted: Yes Temperature / Pain Temperature: No Abnormality Treatment Notes Wound #77 (Foot) Wound Laterality: Plantar, Right Cleanser Soap and Water Discharge Instruction: May shower and wash wound with dial antibacterial soap and water prior to dressing change. Wound Cleanser Discharge Instruction: Cleanse the wound with wound cleanser prior to applying [name] clean dressing using gauze sponges, not tissue or cotton balls. Peri-Wound Care Sween Lotion (Moisturizing lotion) Discharge Instruction: Apply Aquaphor moisturizing lotion as directed Topical Mupirocin Ointment Discharge Instruction: Apply Mupirocin (Bactroban) as instructed Primary Dressing Maxorb Extra Ag+ Alginate Dressing, 2x2 (in/in) Discharge Instruction: Apply to wound bed as instructed Secondary Dressing Woven Gauze Sponge, Non-Sterile 4x4 in Discharge  Instruction: Apply over primary dressing as directed. Secured With [employer], 4.5x3.1 (in/yd) Discharge Instruction: Secure with Kerlix as directed. [name], [name] MASK ([id number]) [id number] Page 15 of 15 32M Medipore H Soft Cloth Surgical T ape, 4 x 10 (in/yd) Discharge Instruction: Secure with tape as directed. Compression Wrap Compression Stockings Add-Ons Electronic Signature(s) Signed: 05/10/2023 4:43:15 PM By: Zenaida Deed RN, BSN Entered By: Zenaida Deed on 05/10/2023 07:59:53 -------------------------------------------------------------------------------- Vitals Details Patient Name: Date of Service: Leiber, [name] 05/10/2023 7:30 [name] Medical Record Number: [id number] Patient Account Number: [id number] Date of Birth/Sex: Treating RN: 04/30/1986 (37 y.o. Damaris Schooner Primary Care Treson Laura: [name], [name] Other Clinician: Referring Archie Shea: Treating Elma Shands/Extender: Benjamine Mola, [name]: 383 Vital Signs Time Taken: 07:39 Temperature (F): 98.2 Height (in): 70 Pulse (bpm): 112 Weight (lbs): 216 Respiratory Rate (breaths/min): 18 Body Mass Index (BMI): 31 Blood Pressure (mmHg): 112/73 Reference  Range: 80 - 120 mg / dl Electronic Signature(s) Signed: 05/10/2023 4:43:15 PM By: Zenaida Deed RN, BSN Entered By: Zenaida Deed on 05/10/2023 07:39:47

## 2023-05-31 ENCOUNTER — Encounter (HOSPITAL_BASED_OUTPATIENT_CLINIC_OR_DEPARTMENT_OTHER): Payer: BC Managed Care – PPO | Attending: General Surgery | Admitting: General Surgery

## 2023-05-31 ENCOUNTER — Encounter: Payer: Self-pay | Admitting: Behavioral Health

## 2023-05-31 ENCOUNTER — Ambulatory Visit: Payer: BC Managed Care – PPO | Admitting: Behavioral Health

## 2023-05-31 DIAGNOSIS — F341 Dysthymic disorder: Secondary | ICD-10-CM | POA: Diagnosis not present

## 2023-05-31 DIAGNOSIS — L98498 Non-pressure chronic ulcer of skin of other sites with other specified severity: Secondary | ICD-10-CM | POA: Diagnosis not present

## 2023-05-31 DIAGNOSIS — L97822 Non-pressure chronic ulcer of other part of left lower leg with fat layer exposed: Secondary | ICD-10-CM | POA: Diagnosis not present

## 2023-05-31 DIAGNOSIS — I10 Essential (primary) hypertension: Secondary | ICD-10-CM | POA: Diagnosis not present

## 2023-05-31 DIAGNOSIS — G8220 Paraplegia, unspecified: Secondary | ICD-10-CM | POA: Insufficient documentation

## 2023-05-31 DIAGNOSIS — I872 Venous insufficiency (chronic) (peripheral): Secondary | ICD-10-CM | POA: Diagnosis not present

## 2023-05-31 DIAGNOSIS — G6289 Other specified polyneuropathies: Secondary | ICD-10-CM | POA: Diagnosis not present

## 2023-05-31 DIAGNOSIS — L97522 Non-pressure chronic ulcer of other part of left foot with fat layer exposed: Secondary | ICD-10-CM | POA: Diagnosis not present

## 2023-05-31 DIAGNOSIS — L97512 Non-pressure chronic ulcer of other part of right foot with fat layer exposed: Secondary | ICD-10-CM | POA: Diagnosis not present

## 2023-05-31 MED ORDER — SERTRALINE HCL 100 MG PO TABS: ORAL_TABLET | ORAL | 3 refills | Status: DC

## 2023-05-31 NOTE — Progress Notes (Signed)
[facility]  Patient ID: [name],  MRN: [id number]  PCP: Eunice Blase, PA-C  Date of Evaluation: 05/31/2023 Time spent:30 minutes  Chief Complaint:  Chief Complaint   Anxiety; Depression; Follow-up; Medication Refill     HISTORY/CURRENT STATUS: HPI  "[name]", 37 year old male presents to this office for follow up and medication management. No changes this visit. Says everything is going well. He is requesting no medication changes  this visit and would like to continue to monitor. He reports 1/10 depression, and 0/10 anxiety. He sleeps 7 plus hours per night. Denies any significant social changes. He is wheelchair bound and has non healing wounds on both legs bilaterally. He goes to wound care once weekly. No changes in his social life. He denies mania, no psychosis. No SI/HI.    No prior psychiatric medication failures Individual Medical History/ Review of Systems: Changes? :No   Allergies: Sulfa antibiotics, Levofloxacin, Meropenem, Penicillins, and Zyvox [linezolid]  Current Medications:  Current Outpatient Medications:    Amino Acids-Protein Hydrolys (FEEDING SUPPLEMENT, PRO-STAT SUGAR FREE 64,) LIQD, Take 30 mLs by mouth 2 (two) times daily., Disp: , Rfl:    Ascorbic Acid (VITAMIN C) 1000 MG tablet, Take 1,000 mg by mouth daily., Disp: , Rfl:    baclofen (LIORESAL) 20 MG tablet, Take 40-60 mg by mouth 3 (three) times daily. 60mg  in the morning, 40mg  in the afternoon, and 60mg  in the evening, Disp: , Rfl:    imipramine (TOFRANIL) 50 MG tablet, Take 50 mg by mouth at bedtime. , Disp: , Rfl:    lisinopril (ZESTRIL) 10 MG tablet, Take 10 mg by mouth daily., Disp: , Rfl:    Melatonin 10 MG CAPS, Take by mouth. Nightly, Disp: , Rfl:    Multiple Vitamin (MULTIVITAMIN) tablet, Take 1 tablet by mouth daily., Disp: , Rfl:    oxybutynin (DITROPAN XL) 15 MG 24 hr tablet, Take 30 mg by mouth at bedtime. , Disp: , Rfl:    sertraline (ZOLOFT) 100 MG tablet, TAKE 1 AND 1/2  TABLETS(150 MG) BY MOUTH DAILY, Disp: 45 tablet, Rfl: 3   zinc sulfate 220 (50 Zn) MG capsule, Take 220 mg by mouth daily., Disp: , Rfl:  Medication Side Effects: none  Family Medical/ Social History: Changes? No  MENTAL HEALTH EXAM:  There were no vitals taken for this visit.There is no height or weight on file to calculate BMI.  General Appearance: Casual and Neat  Eye Contact:  Good  Speech:  Clear and Coherent  Volume:  Normal  Mood:  NA  Affect:  Appropriate  Thought Process:  Coherent  Orientation:  Full (Time, Place, and Person)  Thought Content: WDL   Suicidal Thoughts:  No  Homicidal Thoughts:  No  Memory:  WNL  Judgement:  Good  Insight:  Good  Psychomotor Activity:  Normal  Concentration:  Concentration: Good  Recall:  Good  Fund of Knowledge: Good  Language: Good  Assets:  Desire for Improvement  ADL's:  Intact  Cognition: WNL  Prognosis:  Good    DIAGNOSES:    ICD-10-CM   1. Persistent depressive disorder with melancholic features, currently mild  F34.1 sertraline (ZOLOFT) 100 MG tablet      Receiving Psychotherapy: No    RECOMMENDATIONS:   Greater than 50% of  30 min face to face time with patient was spent on counseling and coordination of care. We discussed his recent significant improvement since his last visit.  He is very happy with how the  increase of Zoloft has been working to reduce his anxiety and depression.  Will continue Zoloft to 150 mg  daily.  Will report worsening symptoms promptly Will follow up in 3 months to reassess Provided emergency contact information Reviewed PDMP       Joan Flores, NP

## 2023-05-31 NOTE — Progress Notes (Signed)
[name], [name] ([id number]) [id number] Page 1 of 15 Visit Report for 05/31/2023 Arrival Information Details Patient Name: Date of Service: [name], [name] 05/31/2023 8:15 [name] Medical Record Number: [id number] Patient Account Number: [id number] Date of Birth/Sex: Treating RN: 12-07-1985 (37 y.o. [name] Primary Care Rickelle Sylvestre: [name], [name] Other Clinician: Referring Versie Fleener: Treating Laurens Matheny/Extender: Fredderick Severance [name]: 386 Visit Information History Since Last Visit Added or deleted any medications: No Patient Arrived: Wheel Chair Any new allergies or adverse reactions: No Arrival Time: 08:16 Had [name] fall or experienced change in No Accompanied By: self activities of daily living that may affect Transfer Assistance: None risk of falls: Patient Identification Verified: Yes Signs or symptoms of abuse/neglect since last visito No Secondary Verification Process Completed: Yes Hospitalized since last visit: No Patient Requires Transmission-Based Precautions: No Implantable device outside of the clinic excluding No Patient Has Alerts: Yes cellular tissue based products placed in the center Patient Alerts: R ABI = 1.0 since last visit: L ABI = 1.1 Has Dressing in Place as Prescribed: Yes Has Compression in Place as Prescribed: Yes Pain Present Now: No Electronic Signature(s) Signed: 05/31/2023 5:02:47 PM By: Zenaida Deed RN, BSN Entered By: Zenaida Deed on 05/31/2023 08:19:07 -------------------------------------------------------------------------------- Encounter Discharge Information Details Patient Name: Date of Service: [name], [name] [name] 05/31/2023 8:15 [name] Medical Record Number: [id number] Patient Account Number: [id number] Date of Birth/Sex: Treating RN: 01-Aug-1986 (37 y.o. [name] Primary Care Nawal Burling: [name], [name] Other Clinician: Referring Dmiyah Liscano: Treating Cailin Gebel/Extender: Fredderick Severance [name]: [phone] Encounter Discharge Information Items Post Procedure Vitals Discharge Condition: Stable Temperature (F): 98.7 Ambulatory Status: Wheelchair Pulse (bpm): 117 Discharge Destination: Home Respiratory Rate (breaths/min): 18 Transportation: Private Auto Blood Pressure (mmHg): 125/67 Accompanied By: self Schedule Follow-up Appointment: Yes Clinical Summary of Care: Patient Declined Electronic Signature(s) Signed: 05/31/2023 5:02:47 PM By: Zenaida Deed RN, BSN Entered By: Zenaida Deed on 05/31/2023 09:09:20 Halterman, [name] ([id number]) [id number] Page 2 of 15 -------------------------------------------------------------------------------- Lower Extremity Assessment Details Patient Name: Date of Service: [name], [name] [name] 05/31/2023 8:15 [name] Medical Record Number: [id number] Patient Account Number: [id number] Date of Birth/Sex: Treating RN: 26-Jan-1986 (37 y.o. [name] Primary Care Keyasia Jolliff: [name], [name] Other Clinician: Referring Shankar Silber: Treating Ellieanna Funderburg/Extender: Benjamine Mola, [name]: 386 Edema Assessment Assessed: [Left: No] [Right: No] Edema: [Left: Yes] [Right: Yes] Calf Left: Right: Point of Measurement: 33 cm From Medial Instep 27 cm 31 cm Ankle Left: Right: Point of Measurement: 10 cm From Medial Instep 25 cm 24 cm Vascular Assessment Pulses: Dorsalis Pedis Palpable: [Left:Yes] [Right:Yes] Electronic Signature(s) Signed: 05/31/2023 5:02:47 PM By: Zenaida Deed RN, BSN Entered By: Zenaida Deed on 05/31/2023 08:26:37 -------------------------------------------------------------------------------- Multi Wound Chart Details Patient Name: Date of Service: [name], [name] [name] 05/31/2023 8:15 [name] Medical Record Number: [id number] Patient Account Number: [id number] Date of Birth/Sex: Treating RN: 02-Jun-1986 (36 y.o. 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 Primary Etiology: Sleep Apnea, Hypertension, Paraplegia Sleep Apnea, Hypertension, Paraplegia Sleep Apnea, Hypertension, Paraplegia Comorbid History: 03/27/2021 03/27/2021 07/11/2021 Date Acquired:  113 113 97 Weeks of Treatment: Open Open Open Wound Status: No No No Wound Recurrence: No No Yes Clustered Wound: [name] [name] [name] Clustered Quantity: 0.9x1.8x0.1 0.9x1.8x0.1 2.2x1.4x0.1 Measurements L x W x D (cm) 1.272 1.272 2.419 [name] (cm) : rea 0.127 0.127 0.242 Volume (cm) : 32.50% 32.50% 61.10% % Reduction in [name] rea: 32.40% 32.40% 61.10% % Reduction in Volume: Full Thickness Without Exposed Full Thickness Without Exposed Full Thickness Without Exposed Classification: Support Structures Support Structures Support Structures Medium Medium Medium Exudate [name] mount: Serosanguineous Serosanguineous Serosanguineous Exudate Type: red, brown red, brown red, brown Exudate Color: Flat and Intact [name] Flat and Intact Wound Margin: Medium (34-66%) Large (67-100%) Medium (34-66%) Granulation [name] mount: Red, Pink, Friable Red, Friable Red, Pink Granulation Quality: Medium (34-66%) Small (1-33%) Medium (34-66%) Necrotic [name] mount: Fat Layer (Subcutaneous Tissue): Yes Fat Layer (Subcutaneous Tissue): Yes Fat Layer (Subcutaneous Tissue): Yes Exposed Structures: Fascia: No Fascia: No Fascia: No Tendon: No Tendon: No Tendon:  No Muscle: No Muscle: No Muscle: No Joint: No Joint: No Joint: No Bone: No Bone: No Bone: No Small (1-33%) Small (1-33%) Small (1-33%) Epithelialization: Debridement - Selective/Open Wound Debridement - Selective/Open Wound Debridement - Selective/Open Wound Debridement: Pre-procedure Verification/Time Out 08:45 08:45 08:45 Taken: [facility] Tissue Debrided: Non-Viable Tissue Non-Viable Tissue Non-Viable Tissue Level: 1.27 1.27 2.42 Debridement [name] (sq cm): rea Curette Curette Curette Instrument: Minimum Minimum Minimum Bleeding: Pressure Pressure Pressure Hemostasis [name] chieved: Insensate Insensate Insensate Procedural Pain: Insensate Insensate Insensate Post Procedural Pain: Procedure was tolerated well Procedure was tolerated well Procedure was tolerated well Debridement Treatment Response: 0.9x1.8x0.1 0.9x1.8x0.1 2.2x1.4x0.1 Post Debridement Measurements L x W x D (cm) 0.127 0.127 0.242 Post Debridement Volume: (cm) No Abnormalities Noted No Abnormalities Noted No Abnormalities Noted Periwound Skin Texture: Dry/Scaly: Yes Dry/Scaly: Yes Dry/Scaly: Yes Periwound Skin Moisture: Maceration: No Erythema: No No Abnormalities Noted Erythema: No Periwound Skin Color: Rubor: No No Abnormality No Abnormality No Abnormality Temperature: Debridement Debridement Debridement Procedures Performed: Wound Number: [id number] Photos: Left, Lateral Lower Leg Left, Posterior Upper Leg Right, Plantar Foot Wound Location: Gradually Appeared Pressure Injury Gradually Appeared Wounding Event: Venous Leg Ulcer Pressure Ulcer Neuropathic Ulcer-Non Diabetic Primary Etiology: Sleep Apnea, Hypertension, Paraplegia Sleep Apnea, Hypertension, Paraplegia Sleep Apnea, Hypertension, Paraplegia Comorbid History: 06/22/2022 01/10/2023 04/27/2023 Date Acquired: 48 19 4 Weeks of Treatment: Open Open Healed - Epithelialized Wound Status: No No No Wound Recurrence: No Yes  No Clustered Wound: [name] 1 [name] Clustered Quantity: 0.6x0.2x0.1 4x3.4x0.1 0x0x0 Measurements L x W x D (cm) 0.094 10.681 0 [name] (cm) : rea 0.009 1.068 0 Volume (cm) : 98.30% 85.70% 100.00% % Reduction in [name] rea: 98.40% 85.70% 100.00% % Reduction in Volume: Full Thickness Without Exposed Category/Stage III Full Thickness Without Exposed Classification: Support Structures Support Structures Small Medium None Present Exudate Amount: Serous Serosanguineous [name] Exudate Type: amber red, brown [name] Exudate Color: Behrendt, Delois E ([id number]) [id number] Page 4 of 15 Distinct, outline attached Flat and Intact [name] Wound Margin: Large (67-100%) Large (67-100%) None Present (0%) Granulation Amount: Pink Red [name] Granulation Quality: None Present (0%) Small (1-33%) None Present (0%) Necrotic Amount: Fat Layer (Subcutaneous Tissue): Yes Fat Layer (Subcutaneous Tissue): Yes Fascia: No Exposed Structures: Fascia: No Fascia: No Fat Layer (Subcutaneous Tissue): No Tendon: No Tendon: No Tendon: No Muscle: No Muscle: No Muscle: No Joint: No Joint: No Joint: No Bone: No Bone: No Bone: No Large (67-100%) Medium (34-66%) Large (67-100%) Epithelialization: Debridement - Selective/Open Wound [name] [name] Debridement: Pre-procedure Verification/Time Out  08:45 [name] [name] Taken: Necrotic/Eschar [name] [name] Tissue Debrided: Non-Viable Tissue [name] [name] Level: 0.09 [name] [name] Debridement [name] (sq cm): rea Curette [name] [name] Instrument: Minimum [name] [name] Bleeding: Pressure [name] [name] Hemostasis [name] chieved: Insensate [name] [name] Procedural Pain: Insensate [name] [name] Post Procedural Pain: Procedure was tolerated well [name] [name] Debridement Treatment Response: 0.6x0.2x0.1 [name] [name] Post Debridement Measurements L x W x D (cm) 0.009 [name] [name] Post Debridement Volume: (cm) Scarring: Yes Scarring: Yes No Abnormalities Noted Periwound Skin Texture: Dry/Scaly: No Dry/Scaly: Yes Maceration:  No Periwound Skin Moisture: Erythema: No Ecchymosis: Yes Rubor: No Periwound Skin Color: No Abnormality No Abnormality No Abnormality Temperature: Debridement [name] [name] Procedures Performed: Treatment Notes Electronic Signature(s) Signed: 05/31/2023 8:54:28 AM By: Duanne Guess MD FACS Entered By: Duanne Guess on 05/31/2023 08:54:28 -------------------------------------------------------------------------------- Multi-Disciplinary Care Plan Details Patient Name: Date of Service: Deignan, [name] [name] 05/31/2023 8:15 [name] Medical Record Number: [id number] Patient Account Number: [id number] Date of Birth/Sex: Treating RN: 09-18-1986 (37 y.o. [name] Primary Care Karry Barrilleaux: [name], [name] Other Clinician: Referring Shilo Philipson: Treating Ameena Vesey/Extender: Fredderick Severance [name]: 386 Multidisciplinary Care Plan reviewed with physician Active Inactive Pressure Nursing Diagnoses: Knowledge deficit related to management of pressures ulcers Potential for impaired tissue integrity related to pressure, friction, moisture, and shear Goals: Patient will remain free from development of additional pressure ulcers Date Initiated: 06/21/2018 Date Inactivated: 02/12/2019 Target Resolution Date: 02/16/2019 Unmet Reason: pressure ulcer Goal Status: Unmet reopened on left ischium Patient/caregiver will verbalize risk factors for pressure ulcer development Date Initiated: 06/21/2018 Date Inactivated: 05/28/2019 Target Resolution Date: 06/01/2019 Goal Status: Met Patient/caregiver will verbalize understanding of pressure ulcer management Date Initiated: 06/01/2022 Target Resolution Date: 06/09/2023 Goal Status: Active Laubach, Mikai E ([id number]) [id number] Page 5 of 15 Interventions: Assess: immobility, friction, shearing, incontinence upon admission and as needed Assess offloading mechanisms upon admission and as needed Assess potential for  pressure ulcer upon admission and as needed Treatment Activities: Pressure reduction/relief device ordered : 12/05/2017 Notes: Wound/Skin Impairment Nursing Diagnoses: Impaired tissue integrity Knowledge deficit related to ulceration/compromised skin integrity Goals: Patient/caregiver will verbalize understanding of skin care regimen Date Initiated: 06/01/2022 Target Resolution Date: 06/09/2023 Goal Status: Active Ulcer/skin breakdown will have [name] volume reduction of 30% by week 4 Date Initiated: 06/01/2022 Date Inactivated: 06/29/2022 Target Resolution Date: 06/29/2022 Unmet Reason: complex wounds, Goal Status: Unmet infection Interventions: Assess patient/caregiver ability to obtain necessary supplies Assess ulceration(s) every visit Provide education on ulcer and skin care Notes: 02/02/21: Complex Care, ongoing. Electronic Signature(s) Signed: 05/31/2023 5:02:47 PM By: Zenaida Deed RN, BSN Entered By: Zenaida Deed on 05/31/2023 08:46:56 -------------------------------------------------------------------------------- Pain Assessment Details Patient Name: Date of Service: Pirozzi, [name] [name] 05/31/2023 8:15 [name] Medical Record Number: [id number] Patient Account Number: [id number] Date of Birth/Sex: Treating RN: [birth year] (36 y.o. [name] Primary Care Jimy Gates: [name], [name] Other Clinician: Referring Shemika Robbs: Treating Drew Herman/Extender: Fredderick Severance [name]: 386 Active Problems Location of Pain Severity and Description of Pain Patient Has Paino No Site Locations Rate the pain. Current Pain Level: 0 Eisen, Kalob E ([id number]) [id number] Page 6 of 15 Pain Management and Medication Current Pain Management: Electronic Signature(s) Signed: 05/31/2023 5:02:47 PM By: Zenaida Deed RN, BSN Entered By: Zenaida Deed on 05/31/2023  08:19:49 -------------------------------------------------------------------------------- Patient/Caregiver Education Details Patient Name: Date of Service: Huizar, [name] 7/9/2024andnbsp8:15 [name] Medical Record Number: [id number] Patient Account Number: [id number] Date of Birth/Gender: Treating RN: [birth year] (37 y.o. [name] Primary Care Physician: Lorene Dy,  [name] Other Clinician: Referring Physician: Treating Physician/Extender: Fredderick Severance [name]: 386 Education Assessment Education Provided To: Patient Education Topics Provided Pressure: Methods: Explain/Verbal Responses: Reinforcements needed, State content correctly Venous: Methods: Explain/Verbal Responses: Reinforcements needed, State content correctly Wound/Skin Impairment: Methods: Explain/Verbal Responses: Reinforcements needed, State content correctly Electronic Signature(s) Signed: 05/31/2023 5:02:47 PM By: Zenaida Deed RN, BSN Entered By: Zenaida Deed on 05/31/2023 08:47:36 -------------------------------------------------------------------------------- Wound Assessment Details Patient Name: Date of Service: Marshman, [name] [name] 05/31/2023 8:15 [name] Medical Record Number: [id number] Patient Account Number: [id number] Date of Birth/Sex: Treating RN: 03-25-1986 (37 y.o. [name] Primary Care Tania Perrott: [name], [name] Other Clinician: Referring Laddie Math: Treating Eddith Mentor/Extender: Benjamine Mola, [name]: 386 Wound Status Wound Number: [id number] Primary Etiology: Venous Leg Ulcer Wound Location: Right, Dorsal Foot Wound Status: Open Wounding Event: Gradually Appeared Comorbid History: Sleep Apnea, Hypertension, Paraplegia Date Acquired: 03/27/2021 Weeks Of Treatment: 113 Cates, Leiby E ([id number]) [id number] Page 7 of 15 Clustered Wound: No Wound Measurements Length: (cm) 0.9 Width: (cm) 1.8 Depth: (cm) 0.1 Area: (cm)  1.272 Volume: (cm) 0.127 % Reduction in Area: 32.5% % Reduction in Volume: 32.4% Epithelialization: Small (1-33%) Tunneling: No Undermining: No Wound Description Classification: Full Thickness Without Exposed Suppor Wound Margin: Flat and Intact Exudate Amount: Medium Exudate Type: Serosanguineous Exudate Color: red, brown t Structures Foul Odor After Cleansing: No Slough/Fibrino Yes Wound Bed Granulation Amount: Medium (34-66%) Exposed Structure Granulation Quality: Red, Pink, Friable Fascia Exposed: No Necrotic Amount: Medium (34-66%) Fat Layer (Subcutaneous Tissue) Exposed: Yes Necrotic Quality: Adherent Slough Tendon Exposed: No Muscle Exposed: No Joint Exposed: No Bone Exposed: No Periwound Skin Texture Texture Color No Abnormalities Noted: Yes No Abnormalities Noted: Yes Moisture Temperature / Pain No Abnormalities Noted: Yes Temperature: No Abnormality Electronic Signature(s) Signed: 05/31/2023 5:02:47 PM By: Zenaida Deed RN, BSN Entered By: Zenaida Deed on 05/31/2023 08:32:01 -------------------------------------------------------------------------------- Wound Assessment Details Patient Name: Date of Service: Hoffmaster, [name] [name] 05/31/2023 8:15 [name] Medical Record Number: [id number] Patient Account Number: [id number] Date of Birth/Sex: Treating RN: 11/14/1986 (37 y.o. [name] Primary Care Salwa Bai: [name], [name] Other Clinician: Referring Ashten Sarnowski: Treating Giavonni Fonder/Extender: Benjamine Mola, [name]: 386 Wound Status Wound Number: [id number] Primary Etiology: Venous Leg Ulcer Wound Location: Right, Dorsal Foot Wound Status: Open Wounding Event: Gradually Appeared Comorbid History: Sleep Apnea, Hypertension, Paraplegia Date Acquired: 03/27/2021 Weeks Of Treatment: 113 Clustered Wound: No Photos Cassedy, Cayden E ([id number]) [id number] Page 8 of 15 Wound Measurements Length: (cm) 0.9 Width: (cm)  1.8 Depth: (cm) 0.1 Area: (cm) 1.272 Volume: (cm) 0.127 % Reduction in Area: 32.5% % Reduction in Volume: 32.4% Epithelialization: Small (1-33%) Wound Description Classification: Full Thickness Without Exposed Su Exudate Amount: Medium Exudate Type: Serosanguineous Exudate Color: red, brown pport Structures Wound Bed Granulation Amount: Large (67-100%) Exposed Structure Granulation Quality: Red, Friable Fascia Exposed: No Necrotic Amount: Small (1-33%) Fat Layer (Subcutaneous Tissue) Exposed: Yes Necrotic Quality: Adherent Slough Tendon Exposed: No Muscle Exposed: No Joint Exposed: No Bone Exposed: No Periwound Skin Texture Texture Color No Abnormalities Noted: Yes No Abnormalities Noted: Yes Moisture Temperature / Pain No Abnormalities Noted: No Temperature: No Abnormality Dry / Scaly: Yes Treatment Notes Wound #52 (Foot) Wound Laterality: Dorsal, Right Cleanser Soap and Water Discharge Instruction: May shower and wash wound with dial antibacterial soap and water prior to dressing change. Wound Cleanser Discharge Instruction: Cleanse the wound with wound cleanser prior to applying [name] clean dressing using gauze sponges, not tissue or cotton balls.  Peri-Wound Care Sween Lotion (Moisturizing lotion) Discharge Instruction: Apply Aquaphor moisturizing lotion as directed Topical Mupirocin Ointment Discharge Instruction: Apply Mupirocin (Bactroban) as instructed Primary Dressing Maxorb Extra Ag+ Alginate Dressing, 2x2 (in/in) Discharge Instruction: Apply to wound bed as instructed Secondary Dressing Woven Gauze Sponge, Non-Sterile 4x4 in Discharge Instruction: Apply over primary dressing as directed. Secured With [employer], 4.5x3.1 (in/yd) Discharge Instruction: Secure with Kerlix as directed. 45M Medipore H Soft Cloth Surgical Tape, 4 x 10 (in/yd) Roldan, Dyan E ([id number]) [id number] Page 9 of 15 Discharge Instruction: Secure with  tape as directed. Compression Wrap Compression Stockings Add-Ons Electronic Signature(s) Signed: 05/31/2023 5:02:47 PM By: Zenaida Deed RN, BSN Entered By: Zenaida Deed on 05/31/2023 08:35:27 -------------------------------------------------------------------------------- Wound Assessment Details Patient Name: Date of Service: Boehm, [name] [name] 05/31/2023 8:15 [name] Medical Record Number: [id number] Patient Account Number: [id number] Date of Birth/Sex: Treating RN: Sep 05, 1986 (36 y.o. [name] Primary Care Kayleb Warshaw: [name], [name] Other Clinician: Referring Brionna Romanek: Treating Tammara Massing/Extender: Benjamine Mola, [name]: 386 Wound Status Wound Number: 56 Primary Etiology: Neuropathic Ulcer-Non Diabetic Wound Location: Left, Dorsal Foot Wound Status: Open Wounding Event: Gradually Appeared Comorbid History: Sleep Apnea, Hypertension, Paraplegia Date Acquired: 07/11/2021 Weeks Of Treatment: 97 Clustered Wound: Yes Photos Wound Measurements Length: (cm) 2.2 Width: (cm) 1.4 Depth: (cm) 0.1 Area: (cm) 2.419 Volume: (cm) 0.242 % Reduction in Area: 61.1% % Reduction in Volume: 61.1% Epithelialization: Small (1-33%) Tunneling: No Undermining: No Wound Description Classification: Full Thickness Without Exposed Suppor Wound Margin: Flat and Intact Exudate Amount: Medium Exudate Type: Serosanguineous Exudate Color: red, brown t Structures Foul Odor After Cleansing: No Slough/Fibrino Yes Wound Bed Granulation Amount: Medium (34-66%) Exposed Structure Granulation Quality: Red, Pink Fascia Exposed: No Necrotic Amount: Medium (34-66%) Fat Layer (Subcutaneous Tissue) Exposed: Yes Necrotic Quality: Adherent Slough Tendon Exposed: No Muscle Exposed: No Joint Exposed: No Bone Exposed: No Scali, Nivin E ([id number]) [id number] Page 10 of 15 Periwound Skin Texture Texture Color No Abnormalities Noted: Yes No Abnormalities  Noted: No Erythema: No Moisture Rubor: No No Abnormalities Noted: No Dry / Scaly: Yes Temperature / Pain Maceration: No Temperature: No Abnormality Treatment Notes Wound #56 (Foot) Wound Laterality: Dorsal, Left Cleanser Soap and Water Discharge Instruction: May shower and wash wound with dial antibacterial soap and water prior to dressing change. Wound Cleanser Discharge Instruction: Cleanse the wound with wound cleanser prior to applying [name] clean dressing using gauze sponges, not tissue or cotton balls. Peri-Wound Care Sween Lotion (Moisturizing lotion) Discharge Instruction: Apply Aquaphor moisturizing lotion as directed Topical Mupirocin Ointment Discharge Instruction: Apply Mupirocin (Bactroban) as instructed Primary Dressing Maxorb Extra Ag+ Alginate Dressing, 2x2 (in/in) Discharge Instruction: Apply to wound bed as instructed Secondary Dressing Woven Gauze Sponge, Non-Sterile 4x4 in Discharge Instruction: Apply over primary dressing as directed. Secured With [employer], 4.5x3.1 (in/yd) Discharge Instruction: Secure with Kerlix as directed. 45M Medipore H Soft Cloth Surgical T ape, 4 x 10 (in/yd) Discharge Instruction: Secure with tape as directed. Compression Wrap Compression Stockings Add-Ons Electronic Signature(s) Signed: 05/31/2023 5:02:47 PM By: Zenaida Deed RN, BSN Entered By: Zenaida Deed on 05/31/2023 08:36:57 -------------------------------------------------------------------------------- Wound Assessment Details Patient Name: Date of Service: Mathe, [name] [name] 05/31/2023 8:15 [name] Medical Record Number: [id number] Patient Account Number: [id number] Date of Birth/Sex: Treating RN: 07-27-1986 (36 y.o. [name] Primary Care Howard Bunte: [name], [name] Other Clinician: Referring Ajani Schnieders: Treating Amahia Madonia/Extender: Benjamine Mola, [name]: 386 Wound Status Wound Number: 67 Primary Etiology: Venous Leg Ulcer Wound  Location: Left, Lateral Lower Leg Wound Status: Open Wounding Event: Gradually Appeared Comorbid History: Sleep Apnea, Hypertension, Paraplegia Date Acquired: 06/22/2022 Weeks Of Treatment: 48 Clustered Wound: No Guidry, Jakeel E ([id number]) [id number] Page 11 of 15 Photos Wound Measurements Length: (cm) 0.6 Width: (cm) 0.2 Depth: (cm) 0.1 Area: (cm) 0.094 Volume: (cm) 0.009 % Reduction in Area: 98.3% % Reduction in Volume: 98.4% Epithelialization: Large (67-100%) Tunneling: No Undermining: No Wound Description Classification: Full Thickness Without Exposed Support Structures Wound Margin: Distinct, outline attached Exudate Amount: Small Exudate Type: Serous Exudate Color: amber Foul Odor After Cleansing: No Slough/Fibrino No Wound Bed Granulation Amount: Large (67-100%) Exposed Structure Granulation Quality: Pink Fascia Exposed: No Necrotic Amount: None Present (0%) Fat Layer (Subcutaneous Tissue) Exposed: Yes Tendon Exposed: No Muscle Exposed: No Joint Exposed: No Bone Exposed: No Periwound Skin Texture Texture Color No Abnormalities Noted: No No Abnormalities Noted: Yes Scarring: Yes Temperature / Pain Temperature: No Abnormality Moisture No Abnormalities Noted: Yes Treatment Notes Wound #67 (Lower Leg) Wound Laterality: Left, Lateral Cleanser Soap and Water Discharge Instruction: May shower and wash wound with dial antibacterial soap and water prior to dressing change. Wound Cleanser Discharge Instruction: Cleanse the wound with wound cleanser prior to applying [name] clean dressing using gauze sponges, not tissue or cotton balls. Peri-Wound Care Sween Lotion (Moisturizing lotion) Discharge Instruction: Apply Aquaphor moisturizing lotion as directed Topical Mupirocin Ointment Discharge Instruction: Apply Mupirocin (Bactroban) as instructed Primary Dressing Maxorb Extra Ag+ Alginate Dressing, 2x2 (in/in) Discharge Instruction: Apply to  wound bed as instructed Secondary Dressing Woven Gauze Sponge, Non-Sterile 4x4 in Discharge Instruction: Apply over primary dressing as directed. Secured With TRACE, WIRICK [id number]) [id number] Page 12 of 15 Kerlix Roll Sterile, 4.5x3.1 (in/yd) Discharge Instruction: Secure with Kerlix as directed. 53M Medipore H Soft Cloth Surgical T ape, 4 x 10 (in/yd) Discharge Instruction: Secure with tape as directed. Compression Wrap Compression Stockings Add-Ons Electronic Signature(s) Signed: 05/31/2023 5:02:47 PM By: Zenaida Deed RN, BSN Entered By: Zenaida Deed on 05/31/2023 08:37:45 -------------------------------------------------------------------------------- Wound Assessment Details Patient Name: Date of Service: Robbs, [name] [name] 05/31/2023 8:15 [name] Medical Record Number: [id number] Patient Account Number: [id number] Date of Birth/Sex: Treating RN: 1986-01-05 (36 y.o. [name] Primary Care Aireona Torelli: [name], [name] Other Clinician: Referring Azaliyah Kennard: Treating Dallin Mccorkel/Extender: Benjamine Mola, [name]: 386 Wound Status Wound Number: 74 Primary Etiology: Pressure Ulcer Wound Location: Left, Posterior Upper Leg Wound Status: Open Wounding Event: Pressure Injury Comorbid History: Sleep Apnea, Hypertension, Paraplegia Date Acquired: 01/10/2023 Weeks Of Treatment: 19 Clustered Wound: Yes Photos Wound Measurements Length: (cm) Width: (cm) Depth: (cm) Clustered Quantity: Area: (cm) Volume: (cm) 4 % Reduction in Area: 85.7% 3.4 % Reduction in Volume: 85.7% 0.1 Epithelialization: Medium (34-66%) 1 Tunneling: No 10.681 Undermining: No 1.068 Wound Description Classification: Category/Stage III Wound Margin: Flat and Intact Exudate Amount: Medium Exudate Type: Serosanguineous Exudate Color: red, brown Foul Odor After Cleansing: No Slough/Fibrino No Wound Bed Granulation Amount: Large (67-100%) Exposed  Structure Granulation Quality: Red Fascia Exposed: No Necrotic Amount: Small (1-33%) Fat Layer (Subcutaneous Tissue) Exposed: Yes Necrotic Quality: Adherent Slough Tendon Exposed: No Hollings, Rudolph E ([id number]) [id number] Page 13 of 15 Muscle Exposed: No Joint Exposed: No Bone Exposed: No Periwound Skin Texture Texture Color No Abnormalities Noted: No No Abnormalities Noted: No Scarring: Yes Ecchymosis: Yes Moisture Temperature / Pain No Abnormalities Noted: No Temperature: No Abnormality Dry / Scaly: Yes Treatment Notes Wound #74 (Upper Leg) Wound Laterality: Left, Posterior Cleanser Vashe 5.8 (oz) Discharge Instruction: Cleanse  the wound with Vashe prior to applying [name] clean dressing using gauze sponges , let sit on wound for 10 minutes Peri-Wound Care Zinc Oxide Ointment 30g tube Discharge Instruction: Apply Zinc Oxide to periwound with each dressing change as needed fpr moisture Topical Keystone antibiotic compound Primary Dressing Maxorb Extra Ag+ Alginate Dressing, 4x4.75 (in/in) Discharge Instruction: Apply to wound bed as instructed Secondary Dressing Zetuvit Plus Silicone Border Sacrum Dressing, Sm, 7x7 (in/in) Discharge Instruction: Apply silicone border over primary dressing as directed. Secured With 19M Medipore H Soft Cloth Surgical T ape, 4 x 10 (in/yd) Discharge Instruction: Secure with tape as directed. Compression Wrap Compression Stockings Add-Ons Electronic Signature(s) Signed: 05/31/2023 5:02:47 PM By: Zenaida Deed RN, BSN Entered By: Zenaida Deed on 05/31/2023 08:45:51 -------------------------------------------------------------------------------- Wound Assessment Details Patient Name: Date of Service: Schutt, [name] [name] 05/31/2023 8:15 [name] Medical Record Number: [id number] Patient Account Number: [id number] Date of Birth/Sex: Treating RN: 1986/03/23 (36 y.o. [name] Primary Care Sebrina Kessner: [name], [name] Other  Clinician: Referring Kemara Quigley: Treating Naven Giambalvo/Extender: Benjamine Mola, [name]: 386 Wound Status Wound Number: 77 Primary Etiology: Neuropathic Ulcer-Non Diabetic Wound Location: Right, Plantar Foot Wound Status: Healed - Epithelialized Wounding Event: Gradually Appeared Comorbid History: Sleep Apnea, Hypertension, Paraplegia Date Acquired: 04/27/2023 Weeks Of Treatment: 4 Clustered Wound: No Photos Madrigal, Zaiden E ([id number]) [id number] Page 14 of 15 Wound Measurements Length: (cm) Width: (cm) Depth: (cm) Area: (cm) Volume: (cm) 0 % Reduction in Area: 100% 0 % Reduction in Volume: 100% 0 Epithelialization: Large (67-100%) 0 Tunneling: No 0 Undermining: No Wound Description Classification: Full Thickness Without Exposed Suppor Exudate Amount: None Present t Structures Foul Odor After Cleansing: No Slough/Fibrino No Wound Bed Granulation Amount: None Present (0%) Exposed Structure Necrotic Amount: None Present (0%) Fascia Exposed: No Fat Layer (Subcutaneous Tissue) Exposed: No Tendon Exposed: No Muscle Exposed: No Joint Exposed: No Bone Exposed: No Periwound Skin Texture Texture Color No Abnormalities Noted: Yes No Abnormalities Noted: Yes Moisture Temperature / Pain No Abnormalities Noted: Yes Temperature: No Abnormality Electronic Signature(s) Signed: 05/31/2023 5:02:47 PM By: Zenaida Deed RN, BSN Entered By: Zenaida Deed on 05/31/2023 08:38:45 -------------------------------------------------------------------------------- Vitals Details Patient Name: Date of Service: Conyer, [name] [name] 05/31/2023 8:15 [name] Medical Record Number: [id number] Patient Account Number: [id number] Date of Birth/Sex: Treating RN: 1985-12-16 (37 y.o. [name] Primary Care Felesia Stahlecker: [name], [name] Other Clinician: Referring Makendra Vigeant: Treating Lennis Korb/Extender: Benjamine Mola, [name]:  386 Vital Signs Time Taken: 08:19 Temperature (F): 98.7 Height (in): 70 Pulse (bpm): 117 Weight (lbs): 216 Respiratory Rate (breaths/min): 18 Body Mass Index (BMI): 31 Blood Pressure (mmHg): 125/67 Reference Range: 80 - 120 mg / dl Electronic Signature(s) Signed: 05/31/2023 5:02:47 PM By: Zenaida Deed RN, BSN Entered By: Zenaida Deed on 05/31/2023 08:19:40 Mcguffee, [name] ([id number]) [id number] Page 15 of 15

## 2023-06-21 ENCOUNTER — Encounter (HOSPITAL_BASED_OUTPATIENT_CLINIC_OR_DEPARTMENT_OTHER): Payer: BC Managed Care – PPO | Admitting: General Surgery

## 2023-06-21 DIAGNOSIS — L98498 Non-pressure chronic ulcer of skin of other sites with other specified severity: Secondary | ICD-10-CM | POA: Diagnosis not present

## 2023-06-21 DIAGNOSIS — G8220 Paraplegia, unspecified: Secondary | ICD-10-CM | POA: Diagnosis not present

## 2023-06-21 DIAGNOSIS — L97822 Non-pressure chronic ulcer of other part of left lower leg with fat layer exposed: Secondary | ICD-10-CM | POA: Diagnosis not present

## 2023-06-21 DIAGNOSIS — L89893 Pressure ulcer of other site, stage 3: Secondary | ICD-10-CM | POA: Diagnosis not present

## 2023-06-21 DIAGNOSIS — I10 Essential (primary) hypertension: Secondary | ICD-10-CM | POA: Diagnosis not present

## 2023-06-21 DIAGNOSIS — I872 Venous insufficiency (chronic) (peripheral): Secondary | ICD-10-CM | POA: Diagnosis not present

## 2023-06-22 NOTE — Progress Notes (Signed)
Chapa, [name] ([id number]) [phone].pdf Page 1 of 13 Visit Report for 06/21/2023 Arrival Information Details Patient Name: Date of Service: [name], [name] 06/21/2023 8:15 [name] Medical Record Number: [id number] Patient Account Number: [id number] Date of Birth/Sex: Treating RN: November 10, 1986 (37 y.o. [name] Primary Care [name]: [name] Other Clinician: Referring [name]: Treating [name]/Extender: [name] [name]: 389 Visit Information History Since Last Visit Added or deleted any medications: No Patient Arrived: Wheel Chair Any new allergies or adverse reactions: No Arrival Time: 08:28 Had [name] fall or experienced change in No Accompanied By: self activities of daily living that may affect Transfer Assistance: None risk of falls: Patient Identification Verified: Yes Signs or symptoms of abuse/neglect since last visito No Secondary Verification Process Completed: Yes Hospitalized since last visit: No Patient Requires Transmission-Based Precautions: No Implantable device outside of the clinic excluding No Patient Has Alerts: Yes cellular tissue based products placed in the center Patient Alerts: R ABI = 1.0 since last visit: L ABI = 1.1 Has Dressing in Place as Prescribed: Yes Has Compression in Place as Prescribed: Yes Pain Present Now: No Electronic Signature(s) Signed: 06/21/2023 5:32:33 PM By: Zenaida Deed RN, BSN Entered By: Zenaida Deed on 06/21/2023 08:28:59 -------------------------------------------------------------------------------- Encounter Discharge Information Details Patient Name: Date of Service: Sheaffer, [name] [name]. 06/21/2023 8:15 [name] Medical Record Number: [id number] Patient Account Number: [id number] Date of Birth/Sex: Treating RN: Nov 10, 1986 (37 y.o. [name] Primary Care Faithlynn Deeley: [name] Other Clinician: Referring [name]: Treating [name]/Extender: [name] [name]: [phone] Encounter Discharge Information Items Post Procedure Vitals Discharge Condition: Stable Temperature (F): 98.4 Ambulatory Status: Wheelchair Pulse (bpm): 108 Discharge Destination: Home Respiratory Rate (breaths/min): 18 Transportation: Private Auto Blood Pressure (mmHg): 135/83 Accompanied By: self Schedule Follow-up Appointment: Yes Clinical Summary of Care: Patient Declined Electronic Signature(s) Signed: 06/21/2023 5:32:33 PM By: Zenaida Deed RN, BSN Entered By: Zenaida Deed on 06/21/2023 09:15:25 [name], [name] ([id number]) [id number] Page 2 of 13 -------------------------------------------------------------------------------- Lower Extremity Assessment Details Patient Name: Date of Service: [name], [name] [name]. 06/21/2023 8:15 [name] Medical Record Number: [id number] Patient Account Number: [id number] Date of Birth/Sex: Treating RN: 12-04-1985 (37 y.o. [name] Primary Care Hykeem Ojeda: [name] Other Clinician: Referring Ellington Greenslade: Treating [name]/Extender: [name], [name] [name]: 389 Edema Assessment Assessed: [Left: No] [Right: No] Edema: [Left: Yes] [Right: Yes] Calf Left: Right: Point of Measurement: 33 cm From Medial Instep 27.5 cm 32 cm Ankle Left: Right: Point of Measurement: 10 cm From Medial Instep 26 cm 24.5 cm Vascular Assessment Pulses: Dorsalis Pedis Palpable: [Left:Yes] [Right:Yes] Extremity colors, hair growth, and conditions: Extremity Color: [Left:Normal] [Right:Normal] Hair Growth on Extremity: [Left:Yes] [Right:Yes] Temperature of Extremity: [Left:Warm < 3 seconds] [Right:Warm < 3 seconds] Toe Nail Assessment Left: Right: Thick: No No Discolored: Yes Yes Deformed: Yes Yes Improper Length and Hygiene: Yes Yes Electronic Signature(s) Signed: 06/21/2023 5:32:33 PM By: Zenaida Deed RN, BSN Entered By: Zenaida Deed on 06/21/2023  08:42:38 -------------------------------------------------------------------------------- Multi Wound Chart Details Patient Name: Date of Service: [name], [name] [name]. 06/21/2023 8:15 [name] Medical Record Number: [id number] Patient Account Number: [id number] Date of Birth/Sex: Treating RN: [birth year] (37 y.o. 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 Primary Etiology: Sleep Apnea, Hypertension, Paraplegia Sleep Apnea, Hypertension, Paraplegia Sleep Apnea, Hypertension, Paraplegia Comorbid History: 03/27/2021 07/11/2021 06/22/2022 Date Acquired: 116 100 51 Weeks of Treatment: Open Open Open Wound Status: No No No Wound Recurrence: No Yes No Clustered Wound: [name] [name] [name] Clustered Quantity: 0.6x0.8x0.1 1.3x1.2x0.1 1.5x0.8x0.1 Measurements L x W x D (cm) 0.377 1.225 0.942 [name] (cm) : rea 0.038 0.123 0.094 Volume (cm) : 80.00% 80.30% 83.20% % Reduction in [name] rea: 79.80% 80.20% 83.20% % Reduction in Volume: Full Thickness Without Exposed Full Thickness Without Exposed Full Thickness Without Exposed Classification: Support Structures Support Structures Support Structures Medium Medium Medium Exudate [name] mount: Serous Serous Serous Exudate Type: Psychologist, forensic Exudate Color: Distinct, outline attached Flat and Intact Distinct, outline attached Wound Margin: Medium (34-66%) Large  (67-100%) Medium (34-66%) Granulation [name] mount: Red, Friable Red Red, Pink Granulation Quality: Medium (34-66%) Small (1-33%) Medium (34-66%) Necrotic [name] mount: Adherent Slough Adherent [employer], Adherent Slough Necrotic Tissue: Fat Layer (Subcutaneous Tissue): Yes Fat Layer (Subcutaneous Tissue): Yes Fat Layer (Subcutaneous Tissue): Yes Exposed Structures: Fascia: No Fascia: No Fascia: No Tendon: No Tendon: No Tendon: No Muscle: No Muscle: No Muscle: No Joint: No Joint: No Joint: No Bone: No Bone: No Bone: No Small (1-33%) Small (1-33%) Small (1-33%) Epithelialization: [name] [name] Debridement - Selective/Open Wound Debridement: Pre-procedure Verification/Time Out [name] [name] 08:55 Taken: [name] [name] Slough Tissue Debrided: [name] [name] Non-Viable Tissue Level: [name] [name] 0.94 Debridement [name] (sq cm): rea [name] [name] Curette Instrument: [name] [name] Minimum Bleeding: [name] [name] Pressure Hemostasis [name] chieved: [name] [name] Insensate Procedural Pain: [name] [name] Insensate Post Procedural Pain: [name] [name] Procedure was tolerated well Debridement Treatment Response: [name] [name] 1.5x0.8x0.1 Post Debridement Measurements L x W x D (cm) [name] [name] 0.094 Post Debridement Volume: (cm) [name] [name] [name] Post Debridement Stage: No Abnormalities Noted No Abnormalities Noted Scarring: Yes Periwound Skin Texture: Dry/Scaly: Yes Maceration: Yes Dry/Scaly: Yes Periwound Skin Moisture: Dry/Scaly: No Rubor: Yes Rubor: Yes Erythema: No Periwound Skin Color: Erythema: No No Abnormality No Abnormality No Abnormality Temperature: [name] [name] Debridement Procedures Performed: Wound Number: [id number] [name] [name] Photos: [name] [name] Left, Posterior Upper Leg [name] [name] Wound Location: Pressure Injury [name] [name] Wounding Event: Pressure Ulcer [name] [name] Primary Etiology: Sleep Apnea, Hypertension, Paraplegia [name] [name] Comorbid History: [name], [name] ([id number]) [id number] Page 4 of 13 01/10/2023 [name] [name] Date  Acquired: 31 [name] [name] Weeks of Treatment: Open [name] [name] Wound Status: No [name] [name] Wound Recurrence: Yes [name] [name] Clustered Wound: 1 [name] [name] Clustered Quantity: 6.1x3x0.1 [name] [name] Measurements L x W x D (cm) 14.373 [name] [name] [name] (cm) : rea 1.437 [name] [name] Volume (cm) : 80.70% [name] [name] % Reduction in [name] rea: 80.70% [name] [name] % Reduction in Volume: Category/Stage III [name] [name] Classification: Medium [name] [name] Exudate [name] mount: Serosanguineous [name] [name] Exudate Type: red, brown [name] [name] Exudate Color: Flat and Intact [name] [name] Wound Margin: Large (67-100%) [name] [name] Granulation [name] mount: Red, Friable [name] [name] Granulation Quality: None Present (0%) [name] [name] Necrotic [name] mount: [name] [name] [name] Necrotic Tissue: Fat Layer (Subcutaneous Tissue): Yes [name] [name] Exposed Structures: Fascia: No Tendon: No Muscle: No Joint: No Bone: No Small (1-33%) [name] [name] Epithelialization: Debridement - Selective/Open Wound [name] [name] Debridement: Pre-procedure Verification/Time Out 08:55 [name] [name] Taken: Slough [name] [name] Tissue Debrided:  Non-Viable Tissue [name] [name] Level: 14.37 [name] [name] Debridement [name] (sq cm): rea Curette [name] [name] Instrument: Minimum [name] [name] Bleeding: Pressure [name] [name] Hemostasis [name] chieved: Insensate [name] [name] Procedural Pain: Insensate [name] [name] Post Procedural Pain: Procedure was tolerated well [name] [name] Debridement Treatment Response: 6.1x3x0.1 [name] [name] Post Debridement Measurements L x W x D (cm) 1.437 [name] [name] Post Debridement Volume: (cm) Category/Stage III [name] [name] Post Debridement Stage: Scarring: Yes [name] [name] Periwound Skin Texture: Dry/Scaly: Yes [name] [name] Periwound Skin Moisture: Ecchymosis: Yes [name] [name] Periwound Skin Color: No Abnormality [name] [name] Temperature: Debridement [name] [name] Procedures Performed: Treatment Notes Electronic Signature(s) Signed: 06/21/2023 9:00:14 AM By: Duanne Guess MD FACS Signed: 06/21/2023 5:32:33 PM By: Zenaida Deed RN, BSN Entered By: Duanne Guess on  06/21/2023 09:00:14 -------------------------------------------------------------------------------- Multi-Disciplinary Care Plan Details Patient Name: Date of Service: Gowell, [name] [name]. 06/21/2023 8:15 [name] Medical Record Number: [id number] Patient Account Number: [id number] Date of Birth/Sex: Treating RN: [birth year] (36 y.o. [name] Primary Care Davida Falconi: [name] Other Clinician: Referring Candelaria Pies: Treating Hieu Herms/Extender: [name] [name]: [phone] Multidisciplinary Care Plan reviewed with physician Active Inactive Pressure Lichtman, Deborah [name] ([id number]) [phone].pdf Page 5 of 13 Nursing Diagnoses: Knowledge deficit related to management of pressures ulcers Potential for impaired tissue integrity related to pressure, friction, moisture, and shear Goals: Patient will remain free from development of additional pressure ulcers Date Initiated: 06/21/2018 Date Inactivated: 02/12/2019 Target Resolution Date: 02/16/2019 Unmet Reason: pressure ulcer Goal Status: Unmet reopened on left ischium Patient/caregiver will verbalize risk factors for pressure ulcer development Date Initiated: 06/21/2018 Date Inactivated: 05/28/2019 Target Resolution Date: 06/01/2019 Goal Status: Met Patient/caregiver will verbalize understanding of pressure ulcer management Date Initiated: 06/01/2022 Target Resolution Date: 07/07/2023 Goal Status: Active Interventions: Assess: immobility, friction, shearing, incontinence upon admission and as needed Assess offloading mechanisms upon admission and as needed Assess potential for pressure ulcer upon admission and as needed Treatment Activities: Pressure reduction/relief device ordered : 12/05/2017 Notes: Wound/Skin Impairment Nursing Diagnoses: Impaired tissue integrity Knowledge deficit related to ulceration/compromised skin integrity Goals: Patient/caregiver will verbalize understanding of skin  care regimen Date Initiated: 06/01/2022 Target Resolution Date: 07/07/2023 Goal Status: Active Ulcer/skin breakdown will have [name] volume reduction of 30% by week 4 Date Initiated: 06/01/2022 Date Inactivated: 06/29/2022 Target Resolution Date: 06/29/2022 Unmet Reason: complex wounds, Goal Status: Unmet infection Interventions: Assess patient/caregiver ability to obtain necessary supplies Assess ulceration(s) every visit Provide education on ulcer and skin care Notes: 02/02/21: Complex Care, ongoing. Electronic Signature(s) Signed: 06/21/2023 5:32:33 PM By: Zenaida Deed RN, BSN Entered By: Zenaida Deed on 06/21/2023 08:21:09 -------------------------------------------------------------------------------- Pain Assessment Details Patient Name: Date of Service: Navarette, [name] [name]. 06/21/2023 8:15 [name] Medical Record Number: [id number] Patient Account Number: [id number] Date of Birth/Sex: Treating RN: Mar 19, 1986 (37 y.o. [name] Primary Care Eltha Tingley: [name] Other Clinician: Referring Jezelle Gullick: Treating Risa Auman/Extender: [name] [name]: 389 Active Problems Location of Pain Severity and Description of Pain Patient Has Paino No Site Locations Rate the pain. [name], [name] ([id number]) [phone].pdf Page 6 of 13 Rate the pain. Current Pain Level: 0 Pain Management and Medication Current Pain Management: Electronic Signature(s) Signed: 06/21/2023 5:32:33 PM By: Zenaida Deed RN, BSN Entered By: Zenaida Deed on 06/21/2023 08:29:29 -------------------------------------------------------------------------------- Patient/Caregiver Education Details Patient Name: Date of Service: Helling, [name] Ronney Asters 7/30/2024andnbsp8:15 [name] Medical Record Number: [id number] Patient Account Number: [id number] Date of Birth/Gender: Treating RN: 1985/11/29 (37 y.o. [name] Primary Care Physician: Lorene Dy,  [name] Other  Clinician: Referring Physician: Treating Physician/Extender: [name] [name]: 389 Education Assessment Education Provided To: Patient Education Topics Provided Pressure: Methods: Explain/Verbal Responses: Reinforcements needed, State content correctly Venous: Methods: Explain/Verbal Responses: Reinforcements needed, State content correctly Wound/Skin Impairment: Methods: Explain/Verbal Responses: Reinforcements needed, State content correctly Electronic Signature(s) Signed: 06/21/2023 5:32:33 PM By: Zenaida Deed RN, BSN Entered By: Zenaida Deed on 06/21/2023 08:21:49 [name], [name] ([id number]) [id number] Page 7 of 13 -------------------------------------------------------------------------------- Wound Assessment Details Patient Name: Date of Service: Boulais, [name] [name]. 06/21/2023 8:15 [name] Medical Record Number: [id number] Patient Account Number: [id number] Date of Birth/Sex: Treating RN: 01-06-1986 (37 y.o. [name] Primary Care Nickayla Mcinnis: [name] Other Clinician: Referring Radiah Lubinski: Treating Amiya Escamilla/Extender: [name], [name] [name]: 389 Wound Status Wound Number: [id number] Primary Etiology: Venous Leg Ulcer Wound Location: Right, Dorsal Foot Wound Status: Open Wounding Event: Gradually Appeared Comorbid History: Sleep Apnea, Hypertension, Paraplegia Date Acquired: 03/27/2021 Weeks Of Treatment: 116 Clustered Wound: No Photos Wound Measurements Length: (cm) 0.6 Width: (cm) 0.8 Depth: (cm) 0.1 Area: (cm) 0.377 Volume: (cm) 0.038 % Reduction in Area: 80% % Reduction in Volume: 79.8% Epithelialization: Small (1-33%) Tunneling: No Undermining: No Wound Description Classification: Full Thickness Without Exposed Suppor Wound Margin: Distinct, outline attached Exudate Amount: Medium Exudate Type: Serous Exudate Color: amber t Structures Foul Odor After Cleansing:  No Slough/Fibrino Yes Wound Bed Granulation Amount: Medium (34-66%) Exposed Structure Granulation Quality: Red, Friable Fascia Exposed: No Necrotic Amount: Medium (34-66%) Fat Layer (Subcutaneous Tissue) Exposed: Yes Necrotic Quality: Adherent Slough Tendon Exposed: No Muscle Exposed: No Joint Exposed: No Bone Exposed: No Periwound Skin Texture Texture Color No Abnormalities Noted: Yes No Abnormalities Noted: No Rubor: Yes Moisture No Abnormalities Noted: No Temperature / Pain Dry / Scaly: Yes Temperature: No Abnormality Treatment Notes Wound #52 (Foot) Wound Laterality: Dorsal, Right Cleanser Soap and Water Wilkinson, Isaih [name] ([id number]) [id number] Page 8 of 13 Discharge Instruction: May shower and wash wound with dial antibacterial soap and water prior to dressing change. Wound Cleanser Discharge Instruction: Cleanse the wound with wound cleanser prior to applying [name] clean dressing using gauze sponges, not tissue or cotton balls. Peri-Wound Care Sween Lotion (Moisturizing lotion) Discharge Instruction: Apply Aquaphor moisturizing lotion as directed Topical Mupirocin Ointment Discharge Instruction: Apply Mupirocin (Bactroban) as instructed Primary Dressing Maxorb Extra Ag+ Alginate Dressing, 2x2 (in/in) Discharge Instruction: Apply to wound bed as instructed Secondary Dressing Woven Gauze Sponge, Non-Sterile 4x4 in Discharge Instruction: Apply over primary dressing as directed. Secured With [employer], 4.5x3.1 (in/yd) Discharge Instruction: Secure with Kerlix as directed. 55M Medipore H Soft Cloth Surgical T ape, 4 x 10 (in/yd) Discharge Instruction: Secure with tape as directed. Compression Wrap Compression Stockings Add-Ons Electronic Signature(s) Signed: 06/21/2023 5:32:33 PM By: Zenaida Deed RN, BSN Entered By: Zenaida Deed on 06/21/2023  08:48:15 -------------------------------------------------------------------------------- Wound Assessment Details Patient Name: Date of Service: Meyn, [name] [name]. 06/21/2023 8:15 [name] Medical Record Number: [id number] Patient Account Number: [id number] Date of Birth/Sex: Treating RN: 04-12-1986 (37 y.o. [name] Primary Care Zavier Canela: [name] Other Clinician: Referring Maday Guarino: Treating Devarius Nelles/Extender: [name], [name] [name]: 389 Wound Status Wound Number: 56 Primary Etiology: Neuropathic Ulcer-Non Diabetic Wound Location: Left, Dorsal Foot Wound Status: Open Wounding Event: Gradually Appeared Comorbid History: Sleep Apnea, Hypertension, Paraplegia Date Acquired: 07/11/2021 Weeks Of Treatment: 100 Clustered Wound: Yes Photos [name], [name] ([id number]) [id number] Page 9 of 13 Wound Measurements Length: (cm) 1.3 Width: (cm) 1.2 Depth: (  cm) 0.1 Area: (cm) 1.225 Volume: (cm) 0.123 % Reduction in Area: 80.3% % Reduction in Volume: 80.2% Epithelialization: Small (1-33%) Tunneling: No Undermining: No Wound Description Classification: Full Thickness Without Exposed Support Structures Wound Margin: Flat and Intact Exudate Amount: Medium Exudate Type: Serous Exudate Color: amber Foul Odor After Cleansing: No Slough/Fibrino Yes Wound Bed Granulation Amount: Large (67-100%) Exposed Structure Granulation Quality: Red Fascia Exposed: No Necrotic Amount: Small (1-33%) Fat Layer (Subcutaneous Tissue) Exposed: Yes Necrotic Quality: Adherent Slough Tendon Exposed: No Muscle Exposed: No Joint Exposed: No Bone Exposed: No Periwound Skin Texture Texture Color No Abnormalities Noted: Yes No Abnormalities Noted: No Erythema: No Moisture Rubor: Yes No Abnormalities Noted: No Dry / Scaly: No Temperature / Pain Maceration: Yes Temperature: No Abnormality Treatment Notes Wound #56 (Foot) Wound Laterality:  Dorsal, Left Cleanser Soap and Water Discharge Instruction: May shower and wash wound with dial antibacterial soap and water prior to dressing change. Wound Cleanser Discharge Instruction: Cleanse the wound with wound cleanser prior to applying [name] clean dressing using gauze sponges, not tissue or cotton balls. Peri-Wound Care Sween Lotion (Moisturizing lotion) Discharge Instruction: Apply Aquaphor moisturizing lotion as directed Topical Mupirocin Ointment Discharge Instruction: Apply Mupirocin (Bactroban) as instructed Primary Dressing Maxorb Extra Ag+ Alginate Dressing, 2x2 (in/in) Discharge Instruction: Apply to wound bed as instructed Secondary Dressing Woven Gauze Sponge, Non-Sterile 4x4 in Discharge Instruction: Apply over primary dressing as directed. Secured With [employer], 4.5x3.1 (in/yd) Discharge Instruction: Secure with Kerlix as directed. 68M Medipore H Soft Cloth Surgical T ape, 4 x 10 (in/yd) Discharge Instruction: Secure with tape as directed. Compression Wrap Compression Stockings Add-Ons Electronic Signature(s) Signed: 06/21/2023 5:32:33 PM By: Zenaida Deed RN, BSN Entered By: Zenaida Deed on 06/21/2023 08:49:36 [name], [name] ([id number]) [id number] Page 10 of 13 -------------------------------------------------------------------------------- Wound Assessment Details Patient Name: Date of Service: [name], [name] [name]. 06/21/2023 8:15 [name] Medical Record Number: [id number] Patient Account Number: [id number] Date of Birth/Sex: Treating RN: [birth year] (37 y.o. [name] Primary Care Leonia Heatherly: [name] Other Clinician: Referring Jearlean Demauro: Treating Lajuane Leatham/Extender: [name], [name] [name]: 389 Wound Status Wound Number: 67 Primary Etiology: Venous Leg Ulcer Wound Location: Left, Lateral Lower Leg Wound Status: Open Wounding Event: Gradually Appeared Comorbid History: Sleep Apnea,  Hypertension, Paraplegia Date Acquired: 06/22/2022 Weeks Of Treatment: 51 Clustered Wound: No Photos Wound Measurements Length: (cm) 1.5 Width: (cm) 0.8 Depth: (cm) 0.1 Area: (cm) 0.942 Volume: (cm) 0.094 % Reduction in Area: 83.2% % Reduction in Volume: 83.2% Epithelialization: Small (1-33%) Tunneling: No Undermining: No Wound Description Classification: Full Thickness Without Exposed Suppor Wound Margin: Distinct, outline attached Exudate Amount: Medium Exudate Type: Serous Exudate Color: amber t Structures Foul Odor After Cleansing: No Slough/Fibrino No Wound Bed Granulation Amount: Medium (34-66%) Exposed Structure Granulation Quality: Red, Pink Fascia Exposed: No Necrotic Amount: Medium (34-66%) Fat Layer (Subcutaneous Tissue) Exposed: Yes Necrotic Quality: Eschar, Adherent Slough Tendon Exposed: No Muscle Exposed: No Joint Exposed: No Bone Exposed: No Periwound Skin Texture Texture Color No Abnormalities Noted: No No Abnormalities Noted: Yes Scarring: Yes Temperature / Pain Temperature: No Abnormality Moisture No Abnormalities Noted: No Dry / Scaly: Yes Treatment Notes [name], [name] ([id number]) [id number] Page 11 of 13 Wound #67 (Lower Leg) Wound Laterality: Left, Lateral Cleanser Soap and Water Discharge Instruction: May shower and wash wound with dial antibacterial soap and water prior to dressing change. Wound Cleanser Discharge Instruction: Cleanse the wound with wound cleanser prior to applying [name] clean dressing using gauze sponges, not tissue  or cotton balls. Peri-Wound Care Sween Lotion (Moisturizing lotion) Discharge Instruction: Apply Aquaphor moisturizing lotion as directed Topical Mupirocin Ointment Discharge Instruction: Apply Mupirocin (Bactroban) as instructed Primary Dressing Maxorb Extra Ag+ Alginate Dressing, 2x2 (in/in) Discharge Instruction: Apply to wound bed as instructed Secondary Dressing Woven Gauze  Sponge, Non-Sterile 4x4 in Discharge Instruction: Apply over primary dressing as directed. Secured With [employer], 4.5x3.1 (in/yd) Discharge Instruction: Secure with Kerlix as directed. 60M Medipore H Soft Cloth Surgical T ape, 4 x 10 (in/yd) Discharge Instruction: Secure with tape as directed. Compression Wrap Compression Stockings Add-Ons Electronic Signature(s) Signed: 06/21/2023 5:32:33 PM By: Zenaida Deed RN, BSN Entered By: Zenaida Deed on 06/21/2023 08:50:40 -------------------------------------------------------------------------------- Wound Assessment Details Patient Name: Date of Service: [name], [name] [name]. 06/21/2023 8:15 [name] Medical Record Number: [id number] Patient Account Number: [id number] Date of Birth/Sex: Treating RN: [birth year] (37 y.o. [name] Primary Care Autry Droege: [name] Other Clinician: Referring Rosaisela Jamroz: Treating Jona Erkkila/Extender: [name], [name] [name]: 389 Wound Status Wound Number: [id number] Primary Etiology: Pressure Ulcer Wound Location: Left, Posterior Upper Leg Wound Status: Open Wounding Event: Pressure Injury Comorbid History: Sleep Apnea, Hypertension, Paraplegia Date Acquired: 01/10/2023 Weeks Of Treatment: 22 Clustered Wound: Yes Photos [name], [name] ([id number]) [id number].pdf Page 12 of 13 Wound Measurements Length: (cm) 6.1 Width: (cm) 3 Depth: (cm) 0.1 Clustered Quantity: 1 Area: (cm) 14.373 Volume: (cm) 1.437 % Reduction in Area: 80.7% % Reduction in Volume: 80.7% Epithelialization: Small (1-33%) Tunneling: No Undermining: No Wound Description Classification: Category/Stage III Wound Margin: Flat and Intact Exudate Amount: Medium Exudate Type: Serosanguineous Exudate Color: red, brown Foul Odor After Cleansing: No Slough/Fibrino No Wound Bed Granulation Amount: Large (67-100%) Exposed Structure Granulation Quality: Red, Friable Fascia Exposed:  No Necrotic Amount: None Present (0%) Fat Layer (Subcutaneous Tissue) Exposed: Yes Tendon Exposed: No Muscle Exposed: No Joint Exposed: No Bone Exposed: No Periwound Skin Texture Texture Color No Abnormalities Noted: No No Abnormalities Noted: Yes Scarring: Yes Temperature / Pain Temperature: No Abnormality Moisture No Abnormalities Noted: Yes Treatment Notes Wound #74 (Upper Leg) Wound Laterality: Left, Posterior Cleanser Vashe 5.8 (oz) Discharge Instruction: Cleanse the wound with Vashe prior to applying [name] clean dressing using gauze sponges , let sit on wound for 10 minutes Peri-Wound Care Zinc Oxide Ointment 30g tube Discharge Instruction: Apply Zinc Oxide to periwound with each dressing change as needed fpr moisture Topical Keystone antibiotic compound Primary Dressing Maxorb Extra Ag+ Alginate Dressing, 4x4.75 (in/in) Discharge Instruction: Apply to wound bed as instructed Secondary Dressing Zetuvit Plus Silicone Border Sacrum Dressing, Sm, 7x7 (in/in) Discharge Instruction: Apply silicone border over primary dressing as directed. Secured With 60M Medipore H Soft Cloth Surgical T ape, 4 x 10 (in/yd) Discharge Instruction: Secure with tape as directed. Compression Wrap Compression Stockings Dimercurio, Micholas [name] ([id number]) [phone].pdf Page 13 of 13 Add-Ons Electronic Signature(s) Signed: 06/21/2023 5:32:33 PM By: Zenaida Deed RN, BSN Entered By: Zenaida Deed on 06/21/2023 08:52:05 -------------------------------------------------------------------------------- Vitals Details Patient Name: Date of Service: Mccrumb, [name] [name]. 06/21/2023 8:15 [name] Medical Record Number: [id number] Patient Account Number: [id number] Date of Birth/Sex: Treating RN: 07-12-1986 (37 y.o. [name] Primary Care Donice Alperin: [name] Other Clinician: Referring Filip Luten: Treating Felicha Frayne/Extender: [name], [name] [name]: 389 Vital  Signs Time Taken: 08:29 Temperature (F): 98.4 Height (in): 70 Pulse (bpm): 108 Weight (lbs): 216 Respiratory Rate (breaths/min): 18 Body Mass Index (BMI): 31 Blood Pressure (mmHg): 135/83 Reference Range: 80 - 120 mg / dl Electronic Signature(s) Signed:  06/21/2023 5:32:33 PM By: Zenaida Deed RN, BSN Entered By: Zenaida Deed on 06/21/2023 08:29:20

## 2023-07-12 ENCOUNTER — Encounter (HOSPITAL_BASED_OUTPATIENT_CLINIC_OR_DEPARTMENT_OTHER): Payer: BC Managed Care – PPO | Attending: General Surgery | Admitting: General Surgery

## 2023-07-12 DIAGNOSIS — I10 Essential (primary) hypertension: Secondary | ICD-10-CM | POA: Diagnosis not present

## 2023-07-12 DIAGNOSIS — G8220 Paraplegia, unspecified: Secondary | ICD-10-CM | POA: Diagnosis not present

## 2023-07-12 DIAGNOSIS — I890 Lymphedema, not elsewhere classified: Secondary | ICD-10-CM | POA: Insufficient documentation

## 2023-07-12 DIAGNOSIS — I872 Venous insufficiency (chronic) (peripheral): Secondary | ICD-10-CM | POA: Diagnosis not present

## 2023-07-12 DIAGNOSIS — G4730 Sleep apnea, unspecified: Secondary | ICD-10-CM | POA: Diagnosis not present

## 2023-07-12 DIAGNOSIS — L97822 Non-pressure chronic ulcer of other part of left lower leg with fat layer exposed: Secondary | ICD-10-CM | POA: Diagnosis not present

## 2023-07-12 DIAGNOSIS — L97512 Non-pressure chronic ulcer of other part of right foot with fat layer exposed: Secondary | ICD-10-CM | POA: Diagnosis not present

## 2023-07-12 DIAGNOSIS — L97809 Non-pressure chronic ulcer of other part of unspecified lower leg with unspecified severity: Secondary | ICD-10-CM | POA: Insufficient documentation

## 2023-07-12 DIAGNOSIS — L97522 Non-pressure chronic ulcer of other part of left foot with fat layer exposed: Secondary | ICD-10-CM | POA: Diagnosis not present

## 2023-07-13 NOTE — Progress Notes (Signed)
[name] [name] ([id number]) [phone].pdf Page 1 of 22 Visit Report for 07/12/2023 Arrival Information Details Patient Name: Date of Service: [name] [name] [name] 07/12/2023 7:30 [name] [name] Medical Record Number: [id number] Patient Account Number: [id number] Date of Birth/Sex: Treating RN: 12-16-1985 (37 y.o. Damaris Schooner Primary Care Adal Sereno: [name], [name] Other Clinician: Referring Emmajane Altamura: Treating Arman Loy/Extender: Fredderick Severance [name]: 392 Visit Information History Since Last Visit Added or deleted any medications: No Patient Arrived: Wheel Chair Any new allergies or adverse reactions: No Arrival Time: 07:39 Had [name] fall or experienced change in No Accompanied By: self activities of daily living that may affect Transfer Assistance: None risk of falls: Patient Identification Verified: Yes Signs or symptoms of abuse/neglect since last visito No Secondary Verification Process Completed: Yes Hospitalized since last visit: No Patient Requires Transmission-Based Precautions: No Implantable device outside of the clinic excluding No Patient Has Alerts: Yes cellular tissue based products placed in the center Patient Alerts: R ABI = 1.0 since last visit: L ABI = 1.1 Has Dressing in Place as Prescribed: Yes Has Compression in Place as Prescribed: Yes Pain Present Now: No Electronic Signature(s) Signed: 07/13/2023 4:36:46 PM By: Zenaida Deed RN, BSN Entered By: Zenaida Deed on 07/12/2023 04:39:59 -------------------------------------------------------------------------------- Encounter Discharge Information Details Patient Name: Date of Service: [name] [name] [name] 07/12/2023 7:30 [name] [name] Medical Record Number: [id number] Patient Account Number: [id number] Date of Birth/Sex: Treating RN: 15-Jan-1986 (37 y.o. Damaris Schooner Primary Care Makina Skow: [name], [name] Other Clinician: Referring Litzi Binning: Treating Apphia Cropley/Extender: Fredderick Severance [name]: 32 Encounter Discharge Information Items Post Procedure Vitals Discharge Condition: Stable Temperature (F): 98.5 Ambulatory Status: Wheelchair Pulse (bpm): 121 Discharge Destination: Home Respiratory Rate (breaths/min): 18 Transportation: Private Auto Blood Pressure (mmHg): 142/73 Accompanied By: self Schedule Follow-up Appointment: Yes Clinical Summary of Care: Patient Declined Electronic Signature(s) Signed: 07/13/2023 4:36:46 PM By: Zenaida Deed RN, BSN Entered By: Zenaida Deed on 07/12/2023 05:56:34 [name] [name] ([id number]) [id number] Page 2 of 22 -------------------------------------------------------------------------------- Lower Extremity Assessment Details Patient Name: Date of Service: [name] [name] [name] 07/12/2023 7:30 [name] [name] Medical Record Number: [id number] Patient Account Number: [id number] Date of Birth/Sex: Treating RN: Sep 12, 1986 (37 y.o. Damaris Schooner Primary Care Natilee Gauer: [name], [name] Other Clinician: Referring Anber Mckiver: Treating Marijah Larranaga/Extender: Benjamine Mola, [name]: 392 Edema Assessment Assessed: [Left: No] [Right: No] Edema: [Left: Yes] [Right: Yes] Calf Left: Right: Point of Measurement: 33 cm From Medial Instep 27.5 cm 32.5 cm Ankle Left: Right: Point of Measurement: 10 cm From Medial Instep 25.5 cm 24 cm Vascular Assessment Pulses: Dorsalis Pedis Palpable: [Left:Yes] [Right:Yes] Extremity colors, hair growth, and conditions: Extremity Color: [Left:Normal] [Right:Normal] Hair Growth on Extremity: [Left:Yes] [Right:Yes] Temperature of Extremity: [Left:Warm < 3 seconds] [Right:Warm < 3 seconds] Electronic Signature(s) Signed: 07/13/2023 4:36:46 PM By: Zenaida Deed RN, BSN Entered By: Zenaida Deed on 07/12/2023 04:47:10 -------------------------------------------------------------------------------- Multi Wound Chart Details Patient  Name: Date of Service: [name] [name] [name] 07/12/2023 7:30 [name] [name] Medical Record Number: [id number] Patient Account Number: [id number] Date of Birth/Sex: Treating RN: [birth year] (37 y.o. 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 Primary Etiology: Sleep Apnea, Hypertension, Paraplegia Sleep Apnea, Hypertension, Paraplegia Sleep Apnea, Hypertension, Paraplegia Comorbid History: 03/27/2021 07/11/2021 06/22/2022 Date Acquired: 119 103 54 Weeks of Treatment: Open Open Open Wound Status: No No No Wound Recurrence: No Yes No Clustered Wound: N/[name] N/[name] N/[name] Clustered Quantity: 2x5.1x0.1 1.8x5.1x0.1 2.2x0.5x0.1 Measurements L x W x D (cm) 8.011 7.21 0.864 [name] (cm) : rea 0.801 0.721 0.086 Volume (cm) : -325.00% -15.90% 84.60% % Reduction in [name] rea: -326.10% -15.90% 84.70% % Reduction in Volume: Full Thickness Without Exposed Full Thickness Without Exposed Full Thickness Without Exposed Classification: Support Structures Support Structures Support Structures Medium Medium Medium Exudate [name] mount: Serous Serous Serous Exudate Type: Psychologist, forensic Exudate Color: Distinct, outline attached Flat and Intact Distinct, outline attached Wound Margin: Small (1-33%) Small (1-33%) Small (1-33%) Granulation [name] mount: Red Red Red, Pink Granulation Quality: Large (67-100%) Large (67-100%) Large (67-100%) Necrotic [name] mount: Adherent  Slough Adherent [employer], Adherent Slough Necrotic Tissue: Fat Layer (Subcutaneous Tissue): Yes Fat Layer (Subcutaneous Tissue): Yes Fat Layer (Subcutaneous Tissue): Yes Exposed Structures: Fascia: No Fascia: No Fascia: No Tendon: No Tendon: No Tendon: No Muscle: No Muscle: No Muscle: No Joint: No Joint: No Joint: No Bone: No Bone: No Bone: No Medium (34-66%) Small (1-33%) Small (1-33%) Epithelialization: Debridement - Selective/Open Wound Debridement - Selective/Open Wound Debridement - Selective/Open Wound Debridement: Pre-procedure Verification/Time Out 08:15 08:15 08:15 Taken: [facility] Tissue Debrided: Skin/Epidermis Skin/Epidermis Skin/Epidermis Level: 8.01 7.21 3.14 Debridement [name] (sq cm): rea Curette Curette Curette Instrument: Minimum Minimum Minimum Bleeding: Pressure Pressure Pressure Hemostasis [name] chieved: Insensate Insensate Insensate Procedural Pain: Insensate Insensate Insensate Post Procedural Pain: Procedure was tolerated well Procedure was tolerated well Procedure was tolerated well Debridement Treatment Response: 2x5.1x0.1 1.8x5.1x0.1 5x0.8x0.1 Post Debridement Measurements L x W x D (cm) 0.801 0.721 0.314 Post Debridement Volume: (cm) No Abnormalities Noted No Abnormalities Noted Scarring: Yes Periwound Skin Texture: Maceration: Yes Maceration: Yes Dry/Scaly: Yes Periwound Skin Moisture: Dry/Scaly: No Dry/Scaly: No Rubor: Yes Rubor: Yes Erythema: No Periwound Skin Color: Erythema: No No Abnormality No Abnormality No Abnormality Temperature: Debridement Debridement Debridement Procedures Performed: Wound Number: [id number] Photos: Left, Posterior Upper Leg Right, Anterior Ankle Left, Plantar Foot Wound Location: Pressure Injury Gradually Appeared Gradually Appeared Wounding Event: Pressure Ulcer Venous Leg Ulcer Lymphedema Primary Etiology: Sleep Apnea, Hypertension, Paraplegia Sleep Apnea, Hypertension,  Paraplegia Sleep Apnea, Hypertension, Paraplegia Comorbid History: 01/10/2023 07/04/2023 07/04/2023 Date Acquired: 25 0 0 Weeks of Treatment: Open Open Open Wound Status: No No No Wound Recurrence: Yes No No Clustered Wound: 5 N/[name] N/[name] Clustered Quantity: 5x7x0.1 2.3x1x0.1 1x1.8x0.1 Measurements L x W x D (cm) 27.489 1.806 1.414 [name] (cm) : rea 2.749 0.181 0.141 Volume (cm) : Ibe, Nandan E ([id number]) [id number] Page 4 of 22 63.20% N/[name] N/[name] % Reduction in Area: 63.20% N/[name] N/[name] % Reduction in Volume: Category/Stage III Full Thickness Without Exposed Full Thickness Without Exposed Classification: Support Structures Support Structures Medium Medium Medium Exudate [name] mount: Serosanguineous Serous Serous Exudate Type: red, brown amber amber Exudate Color: Flat and Intact Flat and Intact Distinct, outline attached Wound Margin: Large (67-100%) Small (1-33%) Small (1-33%) Granulation [name] mount: Red, Friable Pink Red Granulation Quality: None Present (0%) Large (67-100%) Large (67-100%) Necrotic [name] mount: N/[name] Adherent Slough Adherent Slough Necrotic Tissue: Fat Layer (Subcutaneous Tissue): Yes Fat Layer (Subcutaneous Tissue): Yes Fat Layer (Subcutaneous Tissue): Yes Exposed Structures: Fascia: No Fascia: No Fascia: No Tendon: No Tendon: No Tendon: No Muscle: No Muscle:  No Muscle: No Joint: No Joint: No Joint: No Bone: No Bone: No Bone: No Small (1-33%) Small (1-33%) Small (1-33%) Epithelialization: N/[name] Debridement - Selective/Open Wound Debridement - Selective/Open Wound Debridement: Pre-procedure Verification/Time Out N/[name] 08:15 08:15 Taken: N/[name] Necrotic/Eschar, [facility] Tissue Debrided: N/[name] Non-Viable Tissue Skin/Epidermis Level: N/[name] 1.81 2.54 Debridement [name] (sq cm): rea N/[name] Curette Curette Instrument: N/[name] Minimum Minimum Bleeding: N/[name] Pressure Pressure Hemostasis [name] chieved: N/[name] Insensate Insensate Procedural Pain: N/[name]  Insensate Insensate Post Procedural Pain: N/[name] Procedure was tolerated well Procedure was tolerated well Debridement Treatment Response: N/[name] 2.3x1x0.1 1.8x1.8x0.1 Post Debridement Measurements L x W x D (cm) N/[name] 0.181 0.254 Post Debridement Volume: (cm) Scarring: Yes No Abnormalities Noted No Abnormalities Noted Periwound Skin Texture: Dry/Scaly: Yes No Abnormalities Noted Maceration: Yes Periwound Skin Moisture: Ecchymosis: Yes No Abnormalities Noted No Abnormalities Noted Periwound Skin Color: No Abnormality No Abnormality No Abnormality Temperature: N/[name] Debridement Debridement Procedures Performed: Wound Number: [id number] N/[name] N/[name] Photos: N/[name] N/[name] Right, Plantar Foot N/[name] N/[name] Wound Location: Gradually Appeared N/[name] N/[name] Wounding Event: Lymphedema N/[name] N/[name] Primary Etiology: Sleep Apnea, Hypertension, Paraplegia N/[name] N/[name] Comorbid History: 07/04/2023 N/[name] N/[name] Date Acquired: 0 N/[name] N/[name] Weeks of Treatment: Open N/[name] N/[name] Wound Status: No N/[name] N/[name] Wound Recurrence: No N/[name] N/[name] Clustered Wound: N/[name] N/[name] N/[name] Clustered Quantity: 2.5x0.6x0.1 N/[name] N/[name] Measurements L x W x D (cm) 1.178 N/[name] N/[name] [name] (cm) : rea 0.118 N/[name] N/[name] Volume (cm) : N/[name] N/[name] N/[name] % Reduction in [name] rea: N/[name] N/[name] N/[name] % Reduction in Volume: Full Thickness Without Exposed N/[name] N/[name] Classification: Support Structures Medium N/[name] N/[name] Exudate [name] mount: Serous N/[name] N/[name] Exudate Type: amber N/[name] N/[name] Exudate Color: Flat and Intact N/[name] N/[name] Wound Margin: Medium (34-66%) N/[name] N/[name] Granulation [name] mount: Red N/[name] N/[name] Granulation Quality: Medium (34-66%) N/[name] N/[name] Necrotic [name] mount: Adherent Slough N/[name] N/[name] Necrotic Tissue: Fat Layer (Subcutaneous Tissue): Yes N/[name] N/[name] Exposed Structures: Fascia: No Tendon: No Muscle: No Joint: No Bone: No Small (1-33%) N/[name] N/[name] Epithelialization: Debridement - Selective/Open Wound N/[name] N/[name] Debridement: Pre-procedure Verification/Time Out 08:15 N/[name] N/[name] Taken: KHAMONI, DEUTSCHER ([id number])  [id number] Page 5 of 22 Slough N/[name] N/[name] Tissue Debrided: Skin/Epidermis N/[name] N/[name] Level: 1.18 N/[name] N/[name] Debridement [name] (sq cm): rea Curette N/[name] N/[name] Instrument: Minimum N/[name] N/[name] Bleeding: Pressure N/[name] N/[name] Hemostasis [name] chieved: Insensate N/[name] N/[name] Procedural Pain: Insensate N/[name] N/[name] Post Procedural Pain: Procedure was tolerated well N/[name] N/[name] Debridement Treatment Response: 2.5x0.6x0.1 N/[name] N/[name] Post Debridement Measurements L x W x D (cm) 0.118 N/[name] N/[name] Post Debridement Volume: (cm) No Abnormalities Noted N/[name] N/[name] Periwound Skin Texture: Maceration: Yes N/[name] N/[name] Periwound Skin Moisture: No Abnormalities Noted N/[name] N/[name] Periwound Skin Color: No Abnormality N/[name] N/[name] Temperature: Debridement N/[name] N/[name] Procedures Performed: Treatment Notes Wound #52 (Foot) Wound Laterality: Dorsal, Right Cleanser Soap and Water Discharge Instruction: May shower and wash wound with dial antibacterial soap and water prior to dressing change. Wound Cleanser Discharge Instruction: Cleanse the wound with wound cleanser prior to applying [name] clean dressing using gauze sponges, not tissue or cotton balls. Peri-Wound Care Sween Lotion (Moisturizing lotion) Discharge Instruction: Apply Aquaphor moisturizing lotion as directed Topical Mupirocin Ointment Discharge Instruction: Apply Mupirocin (Bactroban) as instructed Primary Dressing Maxorb Extra Ag+ Alginate Dressing, 2x2 (in/in) Discharge Instruction: Apply to wound bed as instructed Secondary Dressing Woven Gauze Sponge, Non-Sterile 4x4 in Discharge Instruction: Apply over primary dressing as directed. Secured With [employer], 4.5x3.1 (in/yd) Discharge Instruction: Secure with Kerlix as directed. 19M Medipore H Soft Cloth  Surgical T ape, 4 x 10 (in/yd) Discharge Instruction: Secure with tape as directed. Compression Wrap Compression Stockings Add-Ons Wound #56 (Foot) Wound Laterality: Dorsal, Left Cleanser Soap and  Water Discharge Instruction: May shower and wash wound with dial antibacterial soap and water prior to dressing change. Wound Cleanser Discharge Instruction: Cleanse the wound with wound cleanser prior to applying [name] clean dressing using gauze sponges, not tissue or cotton balls. Peri-Wound Care Sween Lotion (Moisturizing lotion) Discharge Instruction: Apply Aquaphor moisturizing lotion as directed Topical Mupirocin Ointment Discharge Instruction: Apply Mupirocin (Bactroban) as instructed Primary Dressing Maxorb Extra Ag+ Alginate Dressing, 2x2 (in/in) Discharge Instruction: Apply to wound bed as instructed Taul, Koree E ([id number]) [id number] Page 6 of 22 Secondary Dressing Woven Gauze Sponge, Non-Sterile 4x4 in Discharge Instruction: Apply over primary dressing as directed. Secured With [employer], 4.5x3.1 (in/yd) Discharge Instruction: Secure with Kerlix as directed. 56M Medipore H Soft Cloth Surgical T ape, 4 x 10 (in/yd) Discharge Instruction: Secure with tape as directed. Compression Wrap Compression Stockings Add-Ons Wound #67 (Lower Leg) Wound Laterality: Left, Lateral Cleanser Soap and Water Discharge Instruction: May shower and wash wound with dial antibacterial soap and water prior to dressing change. Wound Cleanser Discharge Instruction: Cleanse the wound with wound cleanser prior to applying [name] clean dressing using gauze sponges, not tissue or cotton balls. Peri-Wound Care Sween Lotion (Moisturizing lotion) Discharge Instruction: Apply Aquaphor moisturizing lotion as directed Topical Mupirocin Ointment Discharge Instruction: Apply Mupirocin (Bactroban) as instructed Primary Dressing Maxorb Extra Ag+ Alginate Dressing, 2x2 (in/in) Discharge Instruction: Apply to wound bed as instructed Secondary Dressing Woven Gauze Sponge, Non-Sterile 4x4 in Discharge Instruction: Apply over primary dressing as directed. Secured With [employer], 4.5x3.1 (in/yd) Discharge Instruction: Secure with Kerlix as directed. 56M Medipore H Soft Cloth Surgical T ape, 4 x 10 (in/yd) Discharge Instruction: Secure with tape as directed. Compression Wrap Compression Stockings Add-Ons Wound #74 (Upper Leg) Wound Laterality: Left, Posterior Cleanser Vashe 5.8 (oz) Discharge Instruction: Cleanse the wound with Vashe prior to applying [name] clean dressing using gauze sponges , let sit on wound for 10 minutes Peri-Wound Care Zinc Oxide Ointment 30g tube Discharge Instruction: Apply Zinc Oxide to periwound with each dressing change as needed fpr moisture Topical Keystone antibiotic compound Primary Dressing Maxorb Extra Ag+ Alginate Dressing, 4x4.75 (in/in) Discharge Instruction: Apply to wound bed as instructed Secondary Dressing Zetuvit Plus Silicone Border Sacrum Dressing, Sm, 7x7 (in/in) Discharge Instruction: Apply silicone border over primary dressing as directed. Secured With ABRAN, BORSA ([id number]) [id number] Page 7 of 22 56M Medipore H Soft Cloth Surgical T ape, 4 x 10 (in/yd) Discharge Instruction: Secure with tape as directed. Compression Wrap Compression Stockings Add-Ons Wound #78 (Ankle) Wound Laterality: Right, Anterior Cleanser Soap and Water Discharge Instruction: May shower and wash wound with dial antibacterial soap and water prior to dressing change. Wound Cleanser Discharge Instruction: Cleanse the wound with wound cleanser prior to applying [name] clean dressing using gauze sponges, not tissue or cotton balls. Peri-Wound Care Sween Lotion (Moisturizing lotion) Discharge Instruction: Apply Aquaphor moisturizing lotion as directed Topical Mupirocin Ointment Discharge Instruction: Apply Mupirocin (Bactroban) as instructed Primary Dressing Maxorb Extra Ag+ Alginate Dressing, 2x2 (in/in) Discharge Instruction: Apply to wound bed as instructed Secondary Dressing Woven Gauze Sponge,  Non-Sterile 4x4 in Discharge Instruction: Apply over primary dressing as directed. Secured With [employer], 4.5x3.1 (in/yd) Discharge Instruction: Secure with Kerlix as directed. 56M Medipore H Soft Cloth Surgical T ape, 4 x 10 (in/yd) Discharge Instruction: Secure with  tape as directed. Compression Wrap Compression Stockings Add-Ons Wound #79 (Foot) Wound Laterality: Plantar, Left Cleanser Soap and Water Discharge Instruction: May shower and wash wound with dial antibacterial soap and water prior to dressing change. Wound Cleanser Discharge Instruction: Cleanse the wound with wound cleanser prior to applying [name] clean dressing using gauze sponges, not tissue or cotton balls. Peri-Wound Care Sween Lotion (Moisturizing lotion) Discharge Instruction: Apply Aquaphor moisturizing lotion as directed Topical Mupirocin Ointment Discharge Instruction: Apply Mupirocin (Bactroban) as instructed Primary Dressing Maxorb Extra Ag+ Alginate Dressing, 2x2 (in/in) Discharge Instruction: Apply to wound bed as instructed Secondary Dressing Woven Gauze Sponge, Non-Sterile 4x4 in Discharge Instruction: Apply over primary dressing as directed. Secured With [employer], 4.5x3.1 (in/yd) Discharge Instruction: Secure with Kerlix as directed. 62M Medipore H Soft Cloth Surgical Tape, 4 x 10 (in/yd) Sulton, Donshay E ([id number]) [id number] Page 8 of 22 Discharge Instruction: Secure with tape as directed. Compression Wrap Compression Stockings Add-Ons Wound #80 (Foot) Wound Laterality: Plantar, Right Cleanser Soap and Water Discharge Instruction: May shower and wash wound with dial antibacterial soap and water prior to dressing change. Wound Cleanser Discharge Instruction: Cleanse the wound with wound cleanser prior to applying [name] clean dressing using gauze sponges, not tissue or cotton balls. Peri-Wound Care Sween Lotion (Moisturizing lotion) Discharge  Instruction: Apply Aquaphor moisturizing lotion as directed Topical Mupirocin Ointment Discharge Instruction: Apply Mupirocin (Bactroban) as instructed Primary Dressing Maxorb Extra Ag+ Alginate Dressing, 2x2 (in/in) Discharge Instruction: Apply to wound bed as instructed Secondary Dressing Woven Gauze Sponge, Non-Sterile 4x4 in Discharge Instruction: Apply over primary dressing as directed. Secured With [employer], 4.5x3.1 (in/yd) Discharge Instruction: Secure with Kerlix as directed. 62M Medipore H Soft Cloth Surgical T ape, 4 x 10 (in/yd) Discharge Instruction: Secure with tape as directed. Compression Wrap Compression Stockings Add-Ons Electronic Signature(s) Signed: 07/12/2023 9:34:29 AM By: Duanne Guess MD FACS Entered By: Duanne Guess on 07/12/2023 06:34:29 -------------------------------------------------------------------------------- Multi-Disciplinary Care Plan Details Patient Name: Date of Service: Morr, [name] [name] 07/12/2023 7:30 [name] [name] Medical Record Number: [id number] Patient Account Number: [id number] Date of Birth/Sex: Treating RN: October 22, 1986 (37 y.o. Damaris Schooner Primary Care Diksha Tagliaferro: [name], [name] Other Clinician: Referring Aryssa Rosamond: Treating Alexandr Oehler/Extender: Fredderick Severance [name]: 392 Multidisciplinary Care Plan reviewed with physician Active Inactive Pressure Nursing Diagnoses: Whipp, Demyan E ([id number]) [phone].pdf Page 9 of 22 Knowledge deficit related to management of pressures ulcers Potential for impaired tissue integrity related to pressure, friction, moisture, and shear Goals: Patient will remain free from development of additional pressure ulcers Date Initiated: 06/21/2018 Date Inactivated: 02/12/2019 Target Resolution Date: 02/16/2019 Unmet Reason: pressure ulcer Goal Status: Unmet reopened on left ischium Patient/caregiver will verbalize risk factors for pressure ulcer  development Date Initiated: 06/21/2018 Date Inactivated: 05/28/2019 Target Resolution Date: 06/01/2019 Goal Status: Met Patient/caregiver will verbalize understanding of pressure ulcer management Date Initiated: 06/01/2022 Target Resolution Date: 08/02/2023 Goal Status: Active Interventions: Assess: immobility, friction, shearing, incontinence upon admission and as needed Assess offloading mechanisms upon admission and as needed Assess potential for pressure ulcer upon admission and as needed Treatment Activities: Pressure reduction/relief device ordered : 12/05/2017 Notes: Venous Leg Ulcer Nursing Diagnoses: Knowledge deficit related to disease process and management Potential for venous Insuffiency (use before diagnosis confirmed) Goals: Non-invasive venous studies are completed as ordered Date Initiated: 01/05/2016 Date Inactivated: 09/15/2017 Target Resolution Date: 01/20/2017 Goal Status: Met Patient will maintain optimal edema control Date Initiated: 01/05/2016 Target Resolution Date: 08/02/2023 Goal Status: Active Patient/caregiver will verbalize understanding of disease  process and disease management Date Initiated: 01/05/2016 Date Inactivated: 09/15/2017 Target Resolution Date: 01/20/2017 Goal Status: Met Verify adequate tissue perfusion prior to therapeutic compression application Date Initiated: 01/05/2016 Date Inactivated: 09/15/2017 Target Resolution Date: 01/20/2017 Goal Status: Met Interventions: Assess peripheral edema status every visit. Compression as ordered Provide education on venous insufficiency Treatment Activities: Non-invasive vascular studies : 03/11/2016 Therapeutic compression applied : 03/11/2016 Notes: Wound/Skin Impairment Nursing Diagnoses: Impaired tissue integrity Knowledge deficit related to ulceration/compromised skin integrity Goals: Patient/caregiver will verbalize understanding of skin care regimen Date Initiated: 06/01/2022 Target Resolution  Date: 08/02/2023 Goal Status: Active Ulcer/skin breakdown will have [name] volume reduction of 30% by week 4 Date Initiated: 06/01/2022 Date Inactivated: 06/29/2022 Target Resolution Date: 06/29/2022 Unmet Reason: complex wounds, Goal Status: Unmet infection Interventions: Assess patient/caregiver ability to obtain necessary supplies Assess ulceration(s) every visit Provide education on ulcer and skin care Notes: 02/02/21: Complex Care, ongoing. Forstrom, ZACHARIE TOTINO ([id number]) [phone].pdf Page 10 of 22 Electronic Signature(s) Signed: 07/13/2023 4:36:46 PM By: Zenaida Deed RN, BSN Entered By: Zenaida Deed on 07/12/2023 05:14:40 -------------------------------------------------------------------------------- Pain Assessment Details Patient Name: Date of Service: Letterman, [name] [name] 07/12/2023 7:30 [name] [name] Medical Record Number: [id number] Patient Account Number: [id number] Date of Birth/Sex: Treating RN: [birth year] (37 y.o. Damaris Schooner Primary Care Celinda Dethlefs: [name], [name] Other Clinician: Referring Asalee Barrette: Treating Crystal Ellwood/Extender: Fredderick Severance [name]: 392 Active Problems Location of Pain Severity and Description of Pain Patient Has Paino No Site Locations Rate the pain. Current Pain Level: 0 Pain Management and Medication Current Pain Management: Electronic Signature(s) Signed: 07/13/2023 4:36:46 PM By: Zenaida Deed RN, BSN Entered By: Zenaida Deed on 07/12/2023 04:42:35 -------------------------------------------------------------------------------- Patient/Caregiver Education Details Patient Name: Date of Service: Falor, Gillermo Murdoch 8/20/2024andnbsp7:30 [name] [name] Medical Record Number: [id number] Patient Account Number: [id number] Date of Birth/Gender: Treating RN: 03/21/1986 (37 y.o. Damaris Schooner Primary Care Physician: Eunice Blase Other Clinician: Referring Physician: Treating Physician/Extender: Fredderick Severance [name]: [phone] Education Assessment Education Provided To: CODYJAMES, SALAMI ([id number]) [id number] Page 11 of 22 Patient Education Topics Provided Pressure: Methods: Explain/Verbal Responses: Reinforcements needed, State content correctly Venous: Methods: Explain/Verbal Responses: Reinforcements needed, State content correctly Wound/Skin Impairment: Methods: Explain/Verbal Responses: Reinforcements needed, State content correctly Electronic Signature(s) Signed: 07/13/2023 4:36:46 PM By: Zenaida Deed RN, BSN Entered By: Zenaida Deed on 07/12/2023 05:15:16 -------------------------------------------------------------------------------- Wound Assessment Details Patient Name: Date of Service: Neidhardt, [name] [name] 07/12/2023 7:30 [name] [name] Medical Record Number: [id number] Patient Account Number: [id number] Date of Birth/Sex: Treating RN: [birth year] (37 y.o. Damaris Schooner Primary Care Jacynda Brunke: [name], [name] Other Clinician: Referring Arnola Crittendon: Treating Lorah Kalina/Extender: Benjamine Mola, [name]: 392 Wound Status Wound Number: 52 Primary Etiology: Venous Leg Ulcer Wound Location: Right, Dorsal Foot Wound Status: Open Wounding Event: Gradually Appeared Comorbid History: Sleep Apnea, Hypertension, Paraplegia Date Acquired: 03/27/2021 Weeks Of Treatment: 119 Clustered Wound: No Photos Wound Measurements Length: (cm) 2 Width: (cm) 5.1 Depth: (cm) 0.1 Area: (cm) 8.011 Volume: (cm) 0.801 % Reduction in Area: -325% % Reduction in Volume: -326.1% Epithelialization: Medium (34-66%) Tunneling: No Undermining: No Wound Description Classification: Full Thickness Without Exposed Suppor Wound Margin: Distinct, outline attached Exudate Amount: Medium Exudate Type: Serous Exudate Color: amber Pawling, Hasaan E ([id number]) Wound Bed Granulation Amount: Small (1-33% Granulation Quality: Red Necrotic  Amount: Large (67-10 Necrotic Quality: Adherent Slo t Structures Foul Odor After Cleansing: No Slough/Fibrino Yes [phone].pdf Page 12 of 22 ) Exposed Structure Fascia Exposed: No 0%) Fat Layer (Subcutaneous  Tissue) Exposed: Yes ugh Tendon Exposed: No Muscle Exposed: No Joint Exposed: No Bone Exposed: No Periwound Skin Texture Texture Color No Abnormalities Noted: Yes No Abnormalities Noted: No Rubor: Yes Moisture No Abnormalities Noted: No Temperature / Pain Dry / Scaly: No Temperature: No Abnormality Maceration: Yes Treatment Notes Wound #52 (Foot) Wound Laterality: Dorsal, Right Cleanser Soap and Water Discharge Instruction: May shower and wash wound with dial antibacterial soap and water prior to dressing change. Wound Cleanser Discharge Instruction: Cleanse the wound with wound cleanser prior to applying [name] clean dressing using gauze sponges, not tissue or cotton balls. Peri-Wound Care Sween Lotion (Moisturizing lotion) Discharge Instruction: Apply Aquaphor moisturizing lotion as directed Topical Mupirocin Ointment Discharge Instruction: Apply Mupirocin (Bactroban) as instructed Primary Dressing Maxorb Extra Ag+ Alginate Dressing, 2x2 (in/in) Discharge Instruction: Apply to wound bed as instructed Secondary Dressing Woven Gauze Sponge, Non-Sterile 4x4 in Discharge Instruction: Apply over primary dressing as directed. Secured With [employer], 4.5x3.1 (in/yd) Discharge Instruction: Secure with Kerlix as directed. 52M Medipore H Soft Cloth Surgical T ape, 4 x 10 (in/yd) Discharge Instruction: Secure with tape as directed. Compression Wrap Compression Stockings Add-Ons Electronic Signature(s) Signed: 07/13/2023 4:36:46 PM By: Zenaida Deed RN, BSN Entered By: Zenaida Deed on 07/12/2023 05:04:04 -------------------------------------------------------------------------------- Wound Assessment Details Patient Name: Date of  Service: [name] [name] [name] 07/12/2023 7:30 [name] [name] Medical Record Number: [id number] Patient Account Number: [id number] Date of Birth/Sex: Treating RN: May 03, 1986 (37 y.o. Damaris Schooner Primary Care Pietro Bonura: [facility], [name] Other Clinician: Referring Yoshiko Keleher: Treating Ruairi Stutsman/Extender: Benjamine Mola, [name] Vanhorn, [name] [name] ([id number]) [id number].pdf Page 13 of 22 [name]: 392 Wound Status Wound Number: 56 Primary Etiology: Neuropathic Ulcer-Non Diabetic Wound Location: Left, Dorsal Foot Wound Status: Open Wounding Event: Gradually Appeared Comorbid History: Sleep Apnea, Hypertension, Paraplegia Date Acquired: 07/11/2021 Weeks Of Treatment: 103 Clustered Wound: Yes Photos Wound Measurements Length: (cm) 1.8 Width: (cm) 5.1 Depth: (cm) 0.1 Area: (cm) 7.21 Volume: (cm) 0.721 % Reduction in Area: -15.9% % Reduction in Volume: -15.9% Epithelialization: Small (1-33%) Tunneling: No Undermining: No Wound Description Classification: Full Thickness Without Exposed Support Structures Wound Margin: Flat and Intact Exudate Amount: Medium Exudate Type: Serous Exudate Color: amber Foul Odor After Cleansing: No Slough/Fibrino Yes Wound Bed Granulation Amount: Small (1-33%) Exposed Structure Granulation Quality: Red Fascia Exposed: No Necrotic Amount: Large (67-100%) Fat Layer (Subcutaneous Tissue) Exposed: Yes Necrotic Quality: Adherent Slough Tendon Exposed: No Muscle Exposed: No Joint Exposed: No Bone Exposed: No Periwound Skin Texture Texture Color No Abnormalities Noted: Yes No Abnormalities Noted: No Erythema: No Moisture Rubor: Yes No Abnormalities Noted: No Dry / Scaly: No Temperature / Pain Maceration: Yes Temperature: No Abnormality Treatment Notes Wound #56 (Foot) Wound Laterality: Dorsal, Left Cleanser Soap and Water Discharge Instruction: May shower and wash wound with dial antibacterial soap and water prior to  dressing change. Wound Cleanser Discharge Instruction: Cleanse the wound with wound cleanser prior to applying [name] clean dressing using gauze sponges, not tissue or cotton balls. Peri-Wound Care Sween Lotion (Moisturizing lotion) Discharge Instruction: Apply Aquaphor moisturizing lotion as directed Topical Mupirocin Ointment Discharge Instruction: Apply Mupirocin (Bactroban) as instructed Loney, Elgar E ([id number]) [id number].pdf Page 14 of 22 Primary Dressing Maxorb Extra Ag+ Alginate Dressing, 2x2 (in/in) Discharge Instruction: Apply to wound bed as instructed Secondary Dressing Woven Gauze Sponge, Non-Sterile 4x4 in Discharge Instruction: Apply over primary dressing as directed. Secured With [employer], 4.5x3.1 (in/yd) Discharge Instruction: Secure with Kerlix as directed. 52M Medipore H Soft Cloth Surgical T ape,  4 x 10 (in/yd) Discharge Instruction: Secure with tape as directed. Compression Wrap Compression Stockings Add-Ons Electronic Signature(s) Signed: 07/13/2023 4:36:46 PM By: Zenaida Deed RN, BSN Entered By: Zenaida Deed on 07/12/2023 05:04:58 -------------------------------------------------------------------------------- Wound Assessment Details Patient Name: Date of Service: [name] [name] [name] 07/12/2023 7:30 [name] [name] Medical Record Number: [id number] Patient Account Number: [id number] Date of Birth/Sex: Treating RN: [birth year] (37 y.o. Damaris Schooner Primary Care Delois Tolbert: [name], [name] Other Clinician: Referring Demya Scruggs: Treating Antonea Gaut/Extender: Benjamine Mola, [name]: 392 Wound Status Wound Number: 67 Primary Etiology: Venous Leg Ulcer Wound Location: Left, Lateral Lower Leg Wound Status: Open Wounding Event: Gradually Appeared Comorbid History: Sleep Apnea, Hypertension, Paraplegia Date Acquired: 06/22/2022 Weeks Of Treatment: 54 Clustered Wound: No Photos Wound Measurements Length: (cm)  2.2 Width: (cm) 0.5 Depth: (cm) 0.1 Area: (cm) 0.864 Volume: (cm) 0.086 % Reduction in Area: 84.6% % Reduction in Volume: 84.7% Epithelialization: Small (1-33%) Tunneling: No Undermining: No Wound Description Classification: Full Thickness Without Exposed Support Structures Wound Margin: Distinct, outline attached Idler, Blaine E ([id number]) Exudate Amount: Medium Exudate Type: Serous Exudate Color: amber Foul Odor After Cleansing: No Slough/Fibrino No [phone].pdf Page 15 of 22 Wound Bed Granulation Amount: Small (1-33%) Exposed Structure Granulation Quality: Red, Pink Fascia Exposed: No Necrotic Amount: Large (67-100%) Fat Layer (Subcutaneous Tissue) Exposed: Yes Necrotic Quality: Eschar, Adherent Slough Tendon Exposed: No Muscle Exposed: No Joint Exposed: No Bone Exposed: No Periwound Skin Texture Texture Color No Abnormalities Noted: No No Abnormalities Noted: Yes Scarring: Yes Temperature / Pain Temperature: No Abnormality Moisture No Abnormalities Noted: No Dry / Scaly: Yes Treatment Notes Wound #67 (Lower Leg) Wound Laterality: Left, Lateral Cleanser Soap and Water Discharge Instruction: May shower and wash wound with dial antibacterial soap and water prior to dressing change. Wound Cleanser Discharge Instruction: Cleanse the wound with wound cleanser prior to applying [name] clean dressing using gauze sponges, not tissue or cotton balls. Peri-Wound Care Sween Lotion (Moisturizing lotion) Discharge Instruction: Apply Aquaphor moisturizing lotion as directed Topical Mupirocin Ointment Discharge Instruction: Apply Mupirocin (Bactroban) as instructed Primary Dressing Maxorb Extra Ag+ Alginate Dressing, 2x2 (in/in) Discharge Instruction: Apply to wound bed as instructed Secondary Dressing Woven Gauze Sponge, Non-Sterile 4x4 in Discharge Instruction: Apply over primary dressing as directed. Secured With [employer], 4.5x3.1  (in/yd) Discharge Instruction: Secure with Kerlix as directed. 55M Medipore H Soft Cloth Surgical T ape, 4 x 10 (in/yd) Discharge Instruction: Secure with tape as directed. Compression Wrap Compression Stockings Add-Ons Electronic Signature(s) Signed: 07/13/2023 4:36:46 PM By: Zenaida Deed RN, BSN Entered By: Zenaida Deed on 07/12/2023 05:06:03 -------------------------------------------------------------------------------- Wound Assessment Details Patient Name: Date of Service: [name] [name] [name] 07/12/2023 7:30 [name] [name] Bishara, Chesley E ([id number]) [id number] Page 16 of 22 Medical Record Number: [id number] Patient Account Number: [id number] Date of Birth/Sex: Treating RN: 23-Jul-1986 (37 y.o. Damaris Schooner Primary Care Paelyn Smick: [name], [name] Other Clinician: Referring Malikah Lakey: Treating Greig Altergott/Extender: Benjamine Mola, [name]: 392 Wound Status Wound Number: 74 Primary Etiology: Pressure Ulcer Wound Location: Left, Posterior Upper Leg Wound Status: Open Wounding Event: Pressure Injury Comorbid History: Sleep Apnea, Hypertension, Paraplegia Date Acquired: 01/10/2023 Weeks Of Treatment: 25 Clustered Wound: Yes Photos Wound Measurements Length: (cm) 5 Width: (cm) 7 Depth: (cm) 0.1 Clustered Quantity: 5 Area: (cm) 27.489 Volume: (cm) 2.749 % Reduction in Area: 63.2% % Reduction in Volume: 63.2% Epithelialization: Small (1-33%) Tunneling: No Undermining: No Wound Description Classification: Category/Stage III Wound Margin: Flat and Intact Exudate Amount: Medium Exudate Type: Serosanguineous  Exudate Color: red, brown Foul Odor After Cleansing: No Slough/Fibrino No Wound Bed Granulation Amount: Large (67-100%) Exposed Structure Granulation Quality: Red, Friable Fascia Exposed: No Necrotic Amount: None Present (0%) Fat Layer (Subcutaneous Tissue) Exposed: Yes Tendon Exposed: No Muscle Exposed: No Joint Exposed:  No Bone Exposed: No Periwound Skin Texture Texture Color No Abnormalities Noted: No No Abnormalities Noted: Yes Scarring: Yes Temperature / Pain Temperature: No Abnormality Moisture No Abnormalities Noted: Yes Treatment Notes Wound #74 (Upper Leg) Wound Laterality: Left, Posterior Cleanser Vashe 5.8 (oz) Discharge Instruction: Cleanse the wound with Vashe prior to applying [name] clean dressing using gauze sponges , let sit on wound for 10 minutes Peri-Wound Care Zinc Oxide Ointment 30g tube Discharge Instruction: Apply Zinc Oxide to periwound with each dressing change as needed fpr moisture Topical Keystone antibiotic compound Ferencz, Arby E ([id number]) [id number] Page 17 of 22 Primary Dressing Maxorb Extra Ag+ Alginate Dressing, 4x4.75 (in/in) Discharge Instruction: Apply to wound bed as instructed Secondary Dressing Zetuvit Plus Silicone Border Sacrum Dressing, Sm, 7x7 (in/in) Discharge Instruction: Apply silicone border over primary dressing as directed. Secured With 37M Medipore H Soft Cloth Surgical T ape, 4 x 10 (in/yd) Discharge Instruction: Secure with tape as directed. Compression Wrap Compression Stockings Add-Ons Electronic Signature(s) Signed: 07/13/2023 4:36:46 PM By: Zenaida Deed RN, BSN Entered By: Zenaida Deed on 07/12/2023 05:07:07 -------------------------------------------------------------------------------- Wound Assessment Details Patient Name: Date of Service: [name] [name] [name] 07/12/2023 7:30 [name] [name] Medical Record Number: [id number] Patient Account Number: [id number] Date of Birth/Sex: Treating RN: 09-24-1986 (37 y.o. Damaris Schooner Primary Care Tasman Zapata: [name], [name] Other Clinician: Referring Marbin Olshefski: Treating Lennette Fader/Extender: Benjamine Mola, [name]: 392 Wound Status Wound Number: 78 Primary Etiology: Venous Leg Ulcer Wound Location: Right, Anterior Ankle Wound Status: Open Wounding  Event: Gradually Appeared Comorbid History: Sleep Apnea, Hypertension, Paraplegia Date Acquired: 07/04/2023 Weeks Of Treatment: 0 Clustered Wound: No Photos Wound Measurements Length: (cm) 2.3 Width: (cm) 1 Depth: (cm) 0.1 Area: (cm) 1.806 Volume: (cm) 0.181 % Reduction in Area: % Reduction in Volume: Epithelialization: Small (1-33%) Tunneling: No Undermining: No Wound Description Classification: Full Thickness Without Exposed Support Structures Wound Margin: Flat and Intact Exudate Amount: Medium Exudate Type: Serous Exudate Color: amber Koslow, Katherine E ([id number]) Foul Odor After Cleansing: No Slough/Fibrino Yes [phone].pdf Page 18 of 22 Wound Bed Granulation Amount: Small (1-33%) Exposed Structure Granulation Quality: Pink Fascia Exposed: No Necrotic Amount: Large (67-100%) Fat Layer (Subcutaneous Tissue) Exposed: Yes Necrotic Quality: Adherent Slough Tendon Exposed: No Muscle Exposed: No Joint Exposed: No Bone Exposed: No Periwound Skin Texture Texture Color No Abnormalities Noted: Yes No Abnormalities Noted: Yes Moisture Temperature / Pain No Abnormalities Noted: Yes Temperature: No Abnormality Treatment Notes Wound #78 (Ankle) Wound Laterality: Right, Anterior Cleanser Soap and Water Discharge Instruction: May shower and wash wound with dial antibacterial soap and water prior to dressing change. Wound Cleanser Discharge Instruction: Cleanse the wound with wound cleanser prior to applying [name] clean dressing using gauze sponges, not tissue or cotton balls. Peri-Wound Care Sween Lotion (Moisturizing lotion) Discharge Instruction: Apply Aquaphor moisturizing lotion as directed Topical Mupirocin Ointment Discharge Instruction: Apply Mupirocin (Bactroban) as instructed Primary Dressing Maxorb Extra Ag+ Alginate Dressing, 2x2 (in/in) Discharge Instruction: Apply to wound bed as instructed Secondary Dressing Woven Gauze Sponge,  Non-Sterile 4x4 in Discharge Instruction: Apply over primary dressing as directed. Secured With [employer], 4.5x3.1 (in/yd) Discharge Instruction: Secure with Kerlix as directed. 37M Medipore H Soft Cloth Surgical T ape, 4 x 10 (in/yd) Discharge  Instruction: Secure with tape as directed. Compression Wrap Compression Stockings Add-Ons Electronic Signature(s) Signed: 07/13/2023 4:36:46 PM By: Zenaida Deed RN, BSN Entered By: Zenaida Deed on 07/12/2023 05:08:04 -------------------------------------------------------------------------------- Wound Assessment Details Patient Name: Date of Service: [name] [name] [name] 07/12/2023 7:30 [name] [name] Medical Record Number: [id number] Patient Account Number: [id number] Date of Birth/Sex: Treating RN: Apr 12, 1986 (37 y.o. Damaris Schooner Primary Care Olukemi Panchal: [name], [name] Other Clinician: Referring Shaquina Gillham: Treating Lennin Osmond/Extender: Benjamine Mola, [name]: [address] Maninder E ([id number]) [id number] Page 19 of 22 Wound Status Wound Number: 79 Primary Etiology: Lymphedema Wound Location: Left, Plantar Foot Wound Status: Open Wounding Event: Gradually Appeared Comorbid History: Sleep Apnea, Hypertension, Paraplegia Date Acquired: 07/04/2023 Weeks Of Treatment: 0 Clustered Wound: No Photos Wound Measurements Length: (cm) 1 Width: (cm) 1.8 Depth: (cm) 0.1 Area: (cm) 1.414 Volume: (cm) 0.141 % Reduction in Area: % Reduction in Volume: Epithelialization: Small (1-33%) Tunneling: No Undermining: No Wound Description Classification: Full Thickness Without Exposed Support Structures Wound Margin: Distinct, outline attached Exudate Amount: Medium Exudate Type: Serous Exudate Color: amber Foul Odor After Cleansing: No Slough/Fibrino No Wound Bed Granulation Amount: Small (1-33%) Exposed Structure Granulation Quality: Red Fascia Exposed: No Necrotic Amount: Large (67-100%) Fat  Layer (Subcutaneous Tissue) Exposed: Yes Necrotic Quality: Adherent Slough Tendon Exposed: No Muscle Exposed: No Joint Exposed: No Bone Exposed: No Periwound Skin Texture Texture Color No Abnormalities Noted: Yes No Abnormalities Noted: Yes Moisture Temperature / Pain No Abnormalities Noted: No Temperature: No Abnormality Maceration: Yes Treatment Notes Wound #79 (Foot) Wound Laterality: Plantar, Left Cleanser Soap and Water Discharge Instruction: May shower and wash wound with dial antibacterial soap and water prior to dressing change. Wound Cleanser Discharge Instruction: Cleanse the wound with wound cleanser prior to applying [name] clean dressing using gauze sponges, not tissue or cotton balls. Peri-Wound Care Sween Lotion (Moisturizing lotion) Discharge Instruction: Apply Aquaphor moisturizing lotion as directed Topical Mupirocin Ointment Discharge Instruction: Apply Mupirocin (Bactroban) as instructed Primary Dressing Maxorb Extra Ag+ Alginate Dressing, 2x2 (in/in) Canady, Zavian E ([id number]) [id number].pdf Page 20 of 22 Discharge Instruction: Apply to wound bed as instructed Secondary Dressing Woven Gauze Sponge, Non-Sterile 4x4 in Discharge Instruction: Apply over primary dressing as directed. Secured With [employer], 4.5x3.1 (in/yd) Discharge Instruction: Secure with Kerlix as directed. 41M Medipore H Soft Cloth Surgical T ape, 4 x 10 (in/yd) Discharge Instruction: Secure with tape as directed. Compression Wrap Compression Stockings Add-Ons Electronic Signature(s) Signed: 07/13/2023 4:36:46 PM By: Zenaida Deed RN, BSN Entered By: Zenaida Deed on 07/12/2023 05:28:23 -------------------------------------------------------------------------------- Wound Assessment Details Patient Name: Date of Service: [name] [name] [name] 07/12/2023 7:30 [name] [name] Medical Record Number: [id number] Patient Account Number: [id number] Date of Birth/Sex: Treating  RN: 08/18/1986 (37 y.o. Damaris Schooner Primary Care Wang Granada: [name], [name] Other Clinician: Referring Shalika Arntz: Treating Kera Deacon/Extender: Benjamine Mola, [name]: 392 Wound Status Wound Number: 80 Primary Etiology: Lymphedema Wound Location: Right, Plantar Foot Wound Status: Open Wounding Event: Gradually Appeared Comorbid History: Sleep Apnea, Hypertension, Paraplegia Date Acquired: 07/04/2023 Weeks Of Treatment: 0 Clustered Wound: No Photos Wound Measurements Length: (cm) 2.5 Width: (cm) 0.6 Depth: (cm) 0.1 Area: (cm) 1.178 Volume: (cm) 0.118 % Reduction in Area: % Reduction in Volume: Epithelialization: Small (1-33%) Tunneling: No Undermining: No Wound Description Classification: Full Thickness Without Exposed Support Structures Wound Margin: Flat and Intact Exudate Amount: Medium Exudate Type: Serous Exudate Color: amber Potempa, Talley E ([id number]) Foul Odor After Cleansing: No Slough/Fibrino Yes [phone].pdf Page 21 of 22 Wound  Bed Granulation Amount: Medium (34-66%) Exposed Structure Granulation Quality: Red Fascia Exposed: No Necrotic Amount: Medium (34-66%) Fat Layer (Subcutaneous Tissue) Exposed: Yes Necrotic Quality: Adherent Slough Tendon Exposed: No Muscle Exposed: No Joint Exposed: No Bone Exposed: No Periwound Skin Texture Texture Color No Abnormalities Noted: Yes No Abnormalities Noted: Yes Moisture Temperature / Pain No Abnormalities Noted: No Temperature: No Abnormality Maceration: Yes Treatment Notes Wound #80 (Foot) Wound Laterality: Plantar, Right Cleanser Soap and Water Discharge Instruction: May shower and wash wound with dial antibacterial soap and water prior to dressing change. Wound Cleanser Discharge Instruction: Cleanse the wound with wound cleanser prior to applying [name] clean dressing using gauze sponges, not tissue or cotton balls. Peri-Wound Care Sween Lotion  (Moisturizing lotion) Discharge Instruction: Apply Aquaphor moisturizing lotion as directed Topical Mupirocin Ointment Discharge Instruction: Apply Mupirocin (Bactroban) as instructed Primary Dressing Maxorb Extra Ag+ Alginate Dressing, 2x2 (in/in) Discharge Instruction: Apply to wound bed as instructed Secondary Dressing Woven Gauze Sponge, Non-Sterile 4x4 in Discharge Instruction: Apply over primary dressing as directed. Secured With [employer], 4.5x3.1 (in/yd) Discharge Instruction: Secure with Kerlix as directed. 60M Medipore H Soft Cloth Surgical T ape, 4 x 10 (in/yd) Discharge Instruction: Secure with tape as directed. Compression Wrap Compression Stockings Add-Ons Electronic Signature(s) Signed: 07/13/2023 4:36:46 PM By: Zenaida Deed RN, BSN Entered By: Zenaida Deed on 07/12/2023 05:30:19 -------------------------------------------------------------------------------- Vitals Details Patient Name: Date of Service: [place], [name] [name] 07/12/2023 7:30 [name] [name] Medical Record Number: [id number] Patient Account Number: [id number] Date of Birth/Sex: Treating RN: 20-Aug-1986 (37 y.o. Damaris Schooner Primary Care Cooper Moroney: [facility], [name] Other Clinician: Referring Milfred Krammes: Treating Tamu Golz/Extender: Benjamine Mola, [name] Doolin, [name] [name] ([id number]) [id number] Page 22 of 22 [name]: 392 Vital Signs Time Taken: 07:41 Temperature (F): 98.5 Height (in): 70 Pulse (bpm): 121 Weight (lbs): 216 Respiratory Rate (breaths/min): 18 Body Mass Index (BMI): 31 Blood Pressure (mmHg): 142/73 Reference Range: 80 - 120 mg / dl Electronic Signature(s) Signed: 07/13/2023 4:36:46 PM By: Zenaida Deed RN, BSN Entered By: Zenaida Deed on 07/12/2023 04:42:00

## 2023-07-21 NOTE — Progress Notes (Signed)
Bogen, Argie E ([id number]) [id number] Page 1 of 12 Visit Report for 04/14/2023 Arrival Information Details Patient Name: Date of Service: [name], [name] 04/14/2023 7:30 [name] Medical Record Number: [id number] Patient Account Number: [id number] Date of Birth/Sex: Treating RN: [birth year] (37 y.o. Damaris Schooner Primary Care Terrin Imparato: [name], [name] Other Clinician: Referring Makalynn Berwanger: Treating Skiler Tye/Extender: Fredderick Severance [name]: 379 Visit Information History Since Last Visit Added or deleted any medications: No Patient Arrived: Wheel Chair Any new allergies or adverse reactions: No Arrival Time: 07:39 Had [name] fall or experienced change in No Accompanied By: self activities of daily living that may affect Transfer Assistance: None risk of falls: Patient Identification Verified: Yes Signs or symptoms of abuse/neglect since last visito No Secondary Verification Process Completed: Yes Hospitalized since last visit: No Patient Requires Transmission-Based Precautions: No Implantable device outside of the clinic excluding No Patient Has Alerts: Yes cellular tissue based products placed in the center Patient Alerts: R ABI = 1.0 since last visit: L ABI = 1.1 Has Dressing in Place as Prescribed: Yes Has Compression in Place as Prescribed: Yes Pain Present Now: No Electronic Signature(s) Signed: 07/21/2023 8:49:41 AM By: Zenaida Deed RN, BSN Signed: 07/21/2023 8:52:39 AM By: Zenaida Deed RN, BSN Previous Signature: 04/14/2023 4:50:34 PM Version By: Zenaida Deed RN, BSN Entered By: Zenaida Deed on 07/21/2023 08:49:41 -------------------------------------------------------------------------------- Encounter Discharge Information Details Patient Name: Date of Service: [name], [name] 04/14/2023 7:30 [name] Medical Record Number: [id number] Patient Account Number: [id number] Date of Birth/Sex: Treating RN: 03-05-1986 (36 y.o. Damaris Schooner Primary Care Laila Myhre: [name], [name] Other Clinician: Referring Cythnia Osmun: Treating Chimamanda Siegfried/Extender: Fredderick Severance [name]: [phone] Encounter Discharge Information Items Post Procedure Vitals Discharge Condition: Stable Temperature (F): 98.3 Ambulatory Status: Wheelchair Pulse (bpm): 108 Discharge Destination: Home Respiratory Rate (breaths/min): 18 Transportation: Private Auto Blood Pressure (mmHg): 131/80 Accompanied By: self Schedule Follow-up Appointment: Yes Clinical Summary of Care: Patient Declined Electronic Signature(s) Signed: 04/14/2023 4:50:34 PM By: Zenaida Deed RN, BSN Signed: 07/21/2023 8:52:39 AM By: Zenaida Deed RN, BSN Entered By: Zenaida Deed on 04/14/2023 16:24:37 [name], [name] ([id number]) [id number] Page 2 of 12 -------------------------------------------------------------------------------- Lower Extremity Assessment Details Patient Name: Date of Service: [name], [name] 04/14/2023 7:30 [name] Medical Record Number: [id number] Patient Account Number: [id number] Date of Birth/Sex: Treating RN: Dec 06, 1985 (37 y.o. Damaris Schooner Primary Care Srishti Strnad: [name], [name] Other Clinician: Referring Kellen Hover: Treating Ranita Stjulien/Extender: Benjamine Mola, [name]: 379 Edema Assessment Assessed: [Left: No] [Right: No] Edema: [Left: Yes] [Right: Yes] Calf Left: Right: Point of Measurement: 33 cm From Medial Instep 28 cm 32.5 cm Ankle Left: Right: Point of Measurement: 10 cm From Medial Instep 25 cm 25 cm Vascular Assessment Pulses: Dorsalis Pedis Palpable: [Left:Yes] [Right:Yes] Electronic Signature(s) Signed: 04/14/2023 4:50:34 PM By: Zenaida Deed RN, BSN Signed: 07/21/2023 8:52:39 AM By: Zenaida Deed RN, BSN Entered By: Zenaida Deed on 04/14/2023 07:46:57 -------------------------------------------------------------------------------- Multi Wound  Chart Details Patient Name: Date of Service: [name], [name] 04/14/2023 7:30 [name] Medical Record Number: [id number] Patient Account Number: [id number] Date of Birth/Sex: Treating RN: 1986/11/08 (36 y.o. 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 Primary Etiology: Sleep Apnea, Hypertension, Paraplegia Sleep Apnea, Hypertension, Paraplegia Sleep Apnea, Hypertension, Paraplegia Comorbid History: 03/27/2021 07/11/2021 06/22/2022 Date Acquired: 106 90 41 Weeks of Treatment: Open Open Open Wound Status: No No No Wound Recurrence: No Yes No Clustered Wound: [name] [name] [name] Clustered Quantity: 1x3.9x0.1 1.4x2.5x0.1 0.1x0.1x0.1 Measurements L x W x D (cm) 3.063 2.749 0.008 [name] (cm) : rea 0.306 0.275 0.001 Volume (cm) : -62.50% 55.80% 99.90% % Reduction in [name] rea: -62.80% 55.80% 99.80% % Reduction in Volume: Full Thickness Without Exposed Full Thickness Without Exposed Full Thickness Without Exposed Classification: Support Structures Support Structures Support Structures Medium Medium Small Exudate [name] mount: Serosanguineous Serosanguineous Serous Exudate Type: red, brown red, brown amber Exudate Color: Flat and Intact Flat and Intact Distinct, outline attached Wound Margin: Small (1-33%) Large (67-100%) None Present (0%) Granulation [name] mount: Pink Red [name] Granulation Quality: Large (67-100%) Small (1-33%) None Present  (0%) Necrotic [name] mount: Adherent Slough Adherent Slough [name] Necrotic Tissue: Fat Layer (Subcutaneous Tissue): Yes Fat Layer (Subcutaneous Tissue): Yes Fat Layer (Subcutaneous Tissue): Yes Exposed Structures: Fascia: No Fascia: No Fascia: No Tendon: No Tendon: No Tendon: No Muscle: No Muscle: No Muscle: No Joint: No Joint: No Joint: No Bone: No Bone: No Bone: No Small (1-33%) None Large (67-100%) Epithelialization: Debridement - Excisional Debridement - Selective/Open Wound Debridement - Selective/Open Wound Debridement: Pre-procedure Verification/Time Out 08:15 08:15 08:15 Taken: Subcutaneous, [facility] Necrotic/Eschar Tissue Debrided: Skin/Subcutaneous Tissue Skin/Epidermis Non-Viable Tissue Level: 4.29 2.75 0.01 Debridement [name] (sq cm): rea Curette Curette Curette Instrument: Minimum Minimum Minimum Bleeding: Pressure Pressure Pressure Hemostasis [name] chieved: Insensate Insensate Insensate Procedural Pain: Insensate Insensate Insensate Post Procedural Pain: Procedure was tolerated well Procedure was tolerated well Procedure was tolerated well Debridement Treatment Response: 1.4x3.9x0.1 1.4x2.5x0.1 0.1x0.1x0.1 Post Debridement Measurements L x W x D (cm) 0.429 0.275 0.001 Post Debridement Volume: (cm) No Abnormalities Noted No Abnormalities Noted No Abnormalities Noted Periwound Skin Texture: Dry/Scaly: Yes Dry/Scaly: Yes Dry/Scaly: Yes Periwound Skin Moisture: Maceration: No Erythema: No Erythema: No Erythema: No Periwound Skin Color: No Abnormality No Abnormality No Abnormality Temperature: Debridement Debridement Debridement Procedures Performed: Wound Number: [id number] [name] Photos: [name] Left, Posterior Upper Leg Right, Lateral Lower Leg [name] Wound Location: Pressure Injury Pressure Injury [name] Wounding Event: Pressure Ulcer Venous Leg Ulcer [name] Primary Etiology: Sleep Apnea, Hypertension, Paraplegia Sleep Apnea, Hypertension, Paraplegia  [name] Comorbid History: 01/10/2023 02/08/2023 [name] Date Acquired: 12 8 [name] Weeks of Treatment: Open Open [name] Wound Status: No Yes [name] Wound Recurrence: Yes No [name] Clustered Wound: 5 [name] [name] Clustered Quantity: 10.3x9x0.1 0.7x1.5x0.1 [name] Measurements L x W x D (cm) 72.806 0.825 [name] [name] (cm) : rea 7.281 0.082 [name] Volume (cm) : 2.40% -66.70% [name] % Reduction in [name] rea: 2.40% -67.30% [name] % Reduction in Volume: Category/Stage III Full Thickness Without Exposed [name] Classification: Support Structures Large Medium [name] Exudate Amount: Serosanguineous Serosanguineous [name] Exudate Type: [name], Marris E ([id number]) [id number] Page 4 of 12 red, brown red, brown [name] Exudate Color: Flat and Intact Flat and Intact [name] Wound Margin: Large (67-100%) Medium (34-66%) [name] Granulation Amount: Red, Friable Pink [name] Granulation Quality: Small (1-33%) Medium (34-66%) [name] Necrotic Amount: Adherent Slough Eschar [name] Necrotic Tissue: Fat Layer (Subcutaneous Tissue): Yes Fat Layer (Subcutaneous Tissue): Yes [name] Exposed Structures: Fascia: No Fascia: No Tendon: No Tendon: No Muscle: No Muscle: No Joint: No Joint: No Bone: No Bone: No Medium (34-66%) Medium (34-66%) [name]  Epithelialization: [name] Debridement - Excisional [name] Debridement: Pre-procedure Verification/Time Out [name] 08:15 [name] Taken: [name] Subcutaneous, Slough [name] Tissue Debrided: [name] Skin/Subcutaneous Tissue [name] Level: [name] 0.82 [name] Debridement [name] (sq cm): rea [name] Curette [name] Instrument: [name] Minimum [name] Bleeding: [name] Pressure [name] Hemostasis [name] chieved: [name] Insensate [name] Procedural Pain: [name] Insensate [name] Post Procedural Pain: [name] Procedure was tolerated well [name] Debridement Treatment Response: [name] 0.7x1.5x0.1 [name] Post Debridement Measurements L x W x D (cm) [name] 0.082 [name] Post Debridement Volume: (cm) Scarring: Yes No Abnormalities Noted [name] Periwound Skin Texture: No Abnormalities Noted  Dry/Scaly: Yes [name] Periwound Skin Moisture: No Abnormalities Noted No Abnormalities Noted [name] Periwound Skin Color: No Abnormality No Abnormality [name] Temperature: [name] Debridement [name] Procedures Performed: Treatment Notes Electronic Signature(s) Signed: 04/14/2023 8:33:44 AM By: Duanne Guess MD FACS Entered By: Duanne Guess on 04/14/2023 08:33:44 -------------------------------------------------------------------------------- Multi-Disciplinary Care Plan Details Patient Name: Date of Service: Persons, [name] 04/14/2023 7:30 [name] Medical Record Number: [id number] Patient Account Number: [id number] Date of Birth/Sex: Treating RN: 1986/11/02 (37 y.o. Damaris Schooner Primary Care Jamaya Sleeth: [name], [name] Other Clinician: Referring Ko Bardon: Treating Clemmie Marxen/Extender: Fredderick Severance [name]: 379 Multidisciplinary Care Plan reviewed with physician Active Inactive Pressure Nursing Diagnoses: Knowledge deficit related to management of pressures ulcers Potential for impaired tissue integrity related to pressure, friction, moisture, and shear Goals: Patient will remain free from development of additional pressure ulcers Date Initiated: 06/21/2018 Date Inactivated: 02/12/2019 Target Resolution Date: 02/16/2019 Unmet Reason: pressure ulcer Goal Status: Unmet reopened on left ischium Patient/caregiver will verbalize risk factors for pressure ulcer development Date Initiated: 06/21/2018 Date Inactivated: 05/28/2019 Target Resolution Date: 06/01/2019 Goal Status: Met Patient/caregiver will verbalize understanding of pressure ulcer management Date Initiated: 06/01/2022 Target Resolution Date: 05/12/2023 IZACH, DOWLIN ([id number]) [id number] Page 5 of 12 Goal Status: Active Interventions: Assess: immobility, friction, shearing, incontinence upon admission and as needed Assess offloading mechanisms upon admission and as needed Assess  potential for pressure ulcer upon admission and as needed Treatment Activities: Pressure reduction/relief device ordered : 12/05/2017 Notes: Wound/Skin Impairment Nursing Diagnoses: Impaired tissue integrity Knowledge deficit related to ulceration/compromised skin integrity Goals: Patient/caregiver will verbalize understanding of skin care regimen Date Initiated: 06/01/2022 Target Resolution Date: 05/12/2023 Goal Status: Active Ulcer/skin breakdown will have [name] volume reduction of 30% by week 4 Date Initiated: 06/01/2022 Date Inactivated: 06/29/2022 Target Resolution Date: 06/29/2022 Unmet Reason: complex wounds, Goal Status: Unmet infection Interventions: Assess patient/caregiver ability to obtain necessary supplies Assess ulceration(s) every visit Provide education on ulcer and skin care Notes: 02/02/21: Complex Care, ongoing. Electronic Signature(s) Signed: 04/14/2023 4:50:34 PM By: Zenaida Deed RN, BSN Signed: 07/21/2023 8:52:39 AM By: Zenaida Deed RN, BSN Entered By: Zenaida Deed on 04/14/2023 08:15:24 -------------------------------------------------------------------------------- Pain Assessment Details Patient Name: Date of Service: Milsap, [name] 04/14/2023 7:30 [name] Medical Record Number: [id number] Patient Account Number: [id number] Date of Birth/Sex: Treating RN: 1986-10-22 (36 y.o. Damaris Schooner Primary Care Jalie Eiland: [name], [name] Other Clinician: Referring Ajahnae Rathgeber: Treating Naimah Yingst/Extender: Benjamine Mola, [name]: 379 Active Problems Location of Pain Severity and Description of Pain Patient Has Paino No Site Locations Rate the pain. Barbara, Elgin E ([id number]) [id number] Page 6 of 12 Rate the pain. Current Pain Level: 0 Pain Management and Medication Current Pain Management: Electronic Signature(s) Signed: 04/14/2023 4:50:34 PM By: Zenaida Deed RN, BSN Signed: 07/21/2023 8:52:39 AM By:  Zenaida Deed RN, BSN Entered By: Zenaida Deed on 04/14/2023 07:41:57 -------------------------------------------------------------------------------- Patient/Caregiver Education Details Patient Name: Date of  Service: Mays, [name] 5/23/2024andnbsp7:30 [name] Medical Record Number: [id number] Patient Account Number: [id number] Date of Birth/Gender: Treating RN: 1986-06-19 (37 y.o. Damaris Schooner Primary Care Physician: Eunice Blase Other Clinician: Referring Physician: Treating Physician/Extender: Fredderick Severance [name]: 379 Education Assessment Education Provided To: Patient Education Topics Provided Pressure: Methods: Explain/Verbal Responses: Reinforcements needed, State content correctly Venous: Methods: Explain/Verbal Responses: Reinforcements needed, State content correctly Wound/Skin Impairment: Methods: Explain/Verbal Responses: Reinforcements needed, State content correctly Electronic Signature(s) Signed: 04/14/2023 4:50:34 PM By: Zenaida Deed RN, BSN Signed: 07/21/2023 8:52:39 AM By: Zenaida Deed RN, BSN Entered By: Zenaida Deed on 04/14/2023 08:16:04 Nieland, [name] ([id number]) [id number] Page 7 of 12 -------------------------------------------------------------------------------- Wound Assessment Details Patient Name: Date of Service: Everard, [name] 04/14/2023 7:30 [name] Medical Record Number: [id number] Patient Account Number: [id number] Date of Birth/Sex: Treating RN: April 26, 1986 (37 y.o. Damaris Schooner Primary Care Amaree Loisel: [name], [name] Other Clinician: Referring Marquitta Persichetti: Treating Arval Brandstetter/Extender: Benjamine Mola, [name]: 379 Wound Status Wound Number: [id number] Primary Etiology: Venous Leg Ulcer Wound Location: Right, Dorsal Foot Wound Status: Open Wounding Event: Gradually Appeared Comorbid History: Sleep Apnea, Hypertension, Paraplegia Date Acquired:  03/27/2021 Weeks Of Treatment: 106 Clustered Wound: No Photos Wound Measurements Length: (cm) 1 Width: (cm) 3.9 Depth: (cm) 0.1 Area: (cm) 3.063 Volume: (cm) 0.306 % Reduction in Area: -62.5% % Reduction in Volume: -62.8% Epithelialization: Small (1-33%) Tunneling: No Undermining: No Wound Description Classification: Full Thickness Without Exposed Su Wound Margin: Flat and Intact Exudate Amount: Medium Exudate Type: Serosanguineous Exudate Color: red, brown pport Structures Wound Bed Granulation Amount: Small (1-33%) Exposed Structure Granulation Quality: Pink Fascia Exposed: No Necrotic Amount: Large (67-100%) Fat Layer (Subcutaneous Tissue) Exposed: Yes Necrotic Quality: Adherent Slough Tendon Exposed: No Muscle Exposed: No Joint Exposed: No Bone Exposed: No Periwound Skin Texture Texture Color No Abnormalities Noted: Yes No Abnormalities Noted: Yes Moisture Temperature / Pain No Abnormalities Noted: Yes Temperature: No Abnormality Electronic Signature(s) Signed: 04/14/2023 4:50:34 PM By: Zenaida Deed RN, BSN Signed: 07/21/2023 8:52:39 AM By: Zenaida Deed RN, BSN Entered By: Zenaida Deed on 04/14/2023 07:59:16 Boisselle, [name] ([id number]) [id number] Page 8 of 12 -------------------------------------------------------------------------------- Wound Assessment Details Patient Name: Date of Service: Defibaugh, [name] 04/14/2023 7:30 [name] Medical Record Number: [id number] Patient Account Number: [id number] Date of Birth/Sex: Treating RN: [birth year] (37 y.o. Damaris Schooner Primary Care Kirkland Figg: [name], [name] Other Clinician: Referring Moustafa Mossa: Treating Elana Jian/Extender: Benjamine Mola, [name]: 379 Wound Status Wound Number: 56 Primary Etiology: Neuropathic Ulcer-Non Diabetic Wound Location: Left, Dorsal Foot Wound Status: Open Wounding Event: Gradually Appeared Comorbid History: Sleep Apnea,  Hypertension, Paraplegia Date Acquired: 07/11/2021 Weeks Of Treatment: 90 Clustered Wound: Yes Photos Wound Measurements Length: (cm) 1.4 Width: (cm) 2.5 Depth: (cm) 0.1 Area: (cm) 2.749 Volume: (cm) 0.275 % Reduction in Area: 55.8% % Reduction in Volume: 55.8% Epithelialization: None Tunneling: No Undermining: No Wound Description Classification: Full Thickness Without Exposed Suppor Wound Margin: Flat and Intact Exudate Amount: Medium Exudate Type: Serosanguineous Exudate Color: red, brown t Structures Foul Odor After Cleansing: No Slough/Fibrino Yes Wound Bed Granulation Amount: Large (67-100%) Exposed Structure Granulation Quality: Red Fascia Exposed: No Necrotic Amount: Small (1-33%) Fat Layer (Subcutaneous Tissue) Exposed: Yes Necrotic Quality: Adherent Slough Tendon Exposed: No Muscle Exposed: No Joint Exposed: No Bone Exposed: No Periwound Skin Texture Texture Color No Abnormalities Noted: Yes No Abnormalities Noted: No Erythema: No Moisture No Abnormalities Noted: No Temperature / Pain Dry / Scaly: Yes  Temperature: No Abnormality Maceration: No Electronic Signature(s) Signed: 04/14/2023 4:50:34 PM By: Zenaida Deed RN, BSN Signed: 07/21/2023 8:52:39 AM By: Zenaida Deed RN, BSN Entered By: Zenaida Deed on 04/14/2023 08:00:07 Bolger, [name] ([id number]) [id number] Page 9 of 12 -------------------------------------------------------------------------------- Wound Assessment Details Patient Name: Date of Service: [name], [name] 04/14/2023 7:30 [name] Medical Record Number: [id number] Patient Account Number: [id number] Date of Birth/Sex: Treating RN: 10/16/1986 (37 y.o. Damaris Schooner Primary Care Dracen Reigle: [name], [name] Other Clinician: Referring Johnston Maddocks: Treating Omolola Mittman/Extender: Benjamine Mola, [name]: 379 Wound Status Wound Number: 67 Primary Etiology: Venous Leg Ulcer Wound Location:  Left, Lateral Lower Leg Wound Status: Open Wounding Event: Gradually Appeared Comorbid History: Sleep Apnea, Hypertension, Paraplegia Date Acquired: 06/22/2022 Weeks Of Treatment: 41 Clustered Wound: No Photos Wound Measurements Length: (cm) 0.1 Width: (cm) 0.1 Depth: (cm) 0.1 Area: (cm) 0.008 Volume: (cm) 0.001 % Reduction in Area: 99.9% % Reduction in Volume: 99.8% Epithelialization: Large (67-100%) Tunneling: No Undermining: No Wound Description Classification: Full Thickness Without Exposed Suppor Wound Margin: Distinct, outline attached Exudate Amount: Small Exudate Type: Serous Exudate Color: amber t Structures Foul Odor After Cleansing: No Slough/Fibrino No Wound Bed Granulation Amount: None Present (0%) Exposed Structure Necrotic Amount: None Present (0%) Fascia Exposed: No Fat Layer (Subcutaneous Tissue) Exposed: Yes Tendon Exposed: No Muscle Exposed: No Joint Exposed: No Bone Exposed: No Periwound Skin Texture Texture Color No Abnormalities Noted: Yes No Abnormalities Noted: No Erythema: No Moisture No Abnormalities Noted: No Temperature / Pain Dry / Scaly: Yes Temperature: No Abnormality Electronic Signature(s) Signed: 04/14/2023 4:50:34 PM By: Zenaida Deed RN, BSN Signed: 07/21/2023 8:52:39 AM By: Zenaida Deed RN, BSN [name], [name] ([id number]) [id number] Page 10 of 12 Entered By: Zenaida Deed on 04/14/2023 08:00:58 -------------------------------------------------------------------------------- Wound Assessment Details Patient Name: Date of Service: [name], [name] 04/14/2023 7:30 [name] Medical Record Number: [id number] Patient Account Number: [id number] Date of Birth/Sex: Treating RN: [birth year] (37 y.o. Damaris Schooner Primary Care Neira Bentsen: [name], [name] Other Clinician: Referring Elyanah Farino: Treating Tallon Gertz/Extender: Benjamine Mola, [name]: 379 Wound Status Wound Number: 74 Primary  Etiology: Pressure Ulcer Wound Location: Left, Posterior Upper Leg Wound Status: Open Wounding Event: Pressure Injury Comorbid History: Sleep Apnea, Hypertension, Paraplegia Date Acquired: 01/10/2023 Weeks Of Treatment: 12 Clustered Wound: Yes Photos Wound Measurements Length: (cm) Width: (cm) Depth: (cm) Clustered Quantity: Area: (cm) Volume: (cm) 10.3 % Reduction in Area: 2.4% 9 % Reduction in Volume: 2.4% 0.1 Epithelialization: Medium (34-66%) 5 Tunneling: No 72.806 Undermining: No 7.281 Wound Description Classification: Category/Stage III Wound Margin: Flat and Intact Exudate Amount: Large Exudate Type: Serosanguineous Exudate Color: red, brown Foul Odor After Cleansing: No Slough/Fibrino No Wound Bed Granulation Amount: Large (67-100%) Exposed Structure Granulation Quality: Red, Friable Fascia Exposed: No Necrotic Amount: Small (1-33%) Fat Layer (Subcutaneous Tissue) Exposed: Yes Necrotic Quality: Adherent Slough Tendon Exposed: No Muscle Exposed: No Joint Exposed: No Bone Exposed: No Periwound Skin Texture Texture Color No Abnormalities Noted: No No Abnormalities Noted: Yes Scarring: Yes Temperature / Pain Temperature: No Abnormality Moisture No Abnormalities Noted: Yes Electronic Signature(s) Klier, Kiing E ([id number]) [id number] Page 11 of 12 Signed: 04/14/2023 4:50:34 PM By: Zenaida Deed RN, BSN Signed: 07/21/2023 8:52:39 AM By: Zenaida Deed RN, BSN Entered By: Zenaida Deed on 04/14/2023 08:09:36 -------------------------------------------------------------------------------- Wound Assessment Details Patient Name: Date of Service: [name], [name] 04/14/2023 7:30 [name] Medical Record Number: [id number] Patient Account Number: [id number] Date of Birth/Sex: Treating RN: 09-Nov-1986 (36 y.o. Bayard Hugger,  [facility] Primary Care Kailoni Vahle: [name], [name] Other Clinician: Referring Kendra Grissett: Treating Taren Toops/Extender: Benjamine Mola, [name]: 379 Wound Status Wound Number: [id number] Primary Etiology: Venous Leg Ulcer Wound Location: Right, Lateral Lower Leg Wound Status: Open Wounding Event: Pressure Injury Comorbid History: Sleep Apnea, Hypertension, Paraplegia Date Acquired: 02/08/2023 Weeks Of Treatment: 8 Clustered Wound: No Photos Wound Measurements Length: (cm) 0.7 Width: (cm) 1.5 Depth: (cm) 0.1 Area: (cm) 0.825 Volume: (cm) 0.082 % Reduction in Area: -66.7% % Reduction in Volume: -67.3% Epithelialization: Medium (34-66%) Tunneling: No Undermining: No Wound Description Classification: Full Thickness Without Exposed Support Structures Wound Margin: Flat and Intact Exudate Amount: Medium Exudate Type: Serosanguineous Exudate Color: red, brown Foul Odor After Cleansing: No Slough/Fibrino No Wound Bed Granulation Amount: Medium (34-66%) Exposed Structure Granulation Quality: Pink Fascia Exposed: No Necrotic Amount: Medium (34-66%) Fat Layer (Subcutaneous Tissue) Exposed: Yes Necrotic Quality: Eschar Tendon Exposed: No Muscle Exposed: No Joint Exposed: No Bone Exposed: No Periwound Skin Texture Texture Color No Abnormalities Noted: Yes No Abnormalities Noted: Yes Moisture Temperature / Pain No Abnormalities Noted: Yes Temperature: No Abnormality Electronic Signature(s) Etzler, Demarquis E ([id number]) [id number] Page 12 of 12 Signed: 04/14/2023 4:50:34 PM By: Zenaida Deed RN, BSN Signed: 07/21/2023 8:52:39 AM By: Zenaida Deed RN, BSN Entered By: Zenaida Deed on 04/14/2023 08:03:32 -------------------------------------------------------------------------------- Vitals Details Patient Name: Date of Service: [name], [name] 04/14/2023 7:30 [name] Medical Record Number: [id number] Patient Account Number: [id number] Date of Birth/Sex: Treating RN: Feb 16, 1986 (36 y.o. Damaris Schooner Primary Care Anfernee Peschke: [name], [name] Other  Clinician: Referring Dulce Martian: Treating Doc Mandala/Extender: Benjamine Mola, [name]: 379 Vital Signs Time Taken: 07:41 Temperature (F): 98.3 Height (in): 70 Pulse (bpm): 108 Weight (lbs): 216 Respiratory Rate (breaths/min): 20 Body Mass Index (BMI): 31 Blood Pressure (mmHg): 131/80 Reference Range: 80 - 120 mg / dl Electronic Signature(s) Signed: 04/14/2023 4:50:34 PM By: Zenaida Deed RN, BSN Signed: 07/21/2023 8:52:39 AM By: Zenaida Deed RN, BSN Entered By: Zenaida Deed on 04/14/2023 07:41:46

## 2023-08-02 ENCOUNTER — Encounter (HOSPITAL_BASED_OUTPATIENT_CLINIC_OR_DEPARTMENT_OTHER): Payer: BC Managed Care – PPO | Attending: General Surgery | Admitting: General Surgery

## 2023-08-02 DIAGNOSIS — G4730 Sleep apnea, unspecified: Secondary | ICD-10-CM | POA: Diagnosis not present

## 2023-08-02 DIAGNOSIS — I10 Essential (primary) hypertension: Secondary | ICD-10-CM | POA: Insufficient documentation

## 2023-08-02 DIAGNOSIS — I890 Lymphedema, not elsewhere classified: Secondary | ICD-10-CM | POA: Insufficient documentation

## 2023-08-02 DIAGNOSIS — G8220 Paraplegia, unspecified: Secondary | ICD-10-CM | POA: Diagnosis not present

## 2023-08-02 DIAGNOSIS — L97822 Non-pressure chronic ulcer of other part of left lower leg with fat layer exposed: Secondary | ICD-10-CM | POA: Diagnosis not present

## 2023-08-02 DIAGNOSIS — L97522 Non-pressure chronic ulcer of other part of left foot with fat layer exposed: Secondary | ICD-10-CM | POA: Diagnosis not present

## 2023-08-02 DIAGNOSIS — G6289 Other specified polyneuropathies: Secondary | ICD-10-CM | POA: Diagnosis not present

## 2023-08-02 DIAGNOSIS — I872 Venous insufficiency (chronic) (peripheral): Secondary | ICD-10-CM | POA: Diagnosis not present

## 2023-08-02 DIAGNOSIS — L97312 Non-pressure chronic ulcer of right ankle with fat layer exposed: Secondary | ICD-10-CM | POA: Diagnosis not present

## 2023-08-02 DIAGNOSIS — L97512 Non-pressure chronic ulcer of other part of right foot with fat layer exposed: Secondary | ICD-10-CM | POA: Diagnosis not present

## 2023-08-02 NOTE — Progress Notes (Signed)
Molony, [name] ([id number]) [phone].pdf Page 1 of 17 Visit Report for 08/02/2023 Arrival Information Details Patient Name: Date of Service: [name], [name] 08/02/2023 7:45 [name] Medical Record Number: [id number] Patient Account Number: [id number] Date of Birth/Sex: Treating RN: [birth year] (37 y.o. [name] Primary Care See Beharry: [name], [name] Other Clinician: Referring [name]: Treating [name]/Extender: [name] [name]: 395 Visit Information History Since Last Visit Added or deleted any medications: No Patient Arrived: Wheel Chair Any new allergies or adverse reactions: No Arrival Time: 07:44 Had [name] fall or experienced change in No Accompanied By: self activities of daily living that may affect Transfer Assistance: None risk of falls: Patient Identification Verified: Yes Signs or symptoms of abuse/neglect since last visito No Secondary Verification Process Completed: Yes Hospitalized since last visit: No Patient Requires Transmission-Based Precautions: No Implantable device outside of the clinic excluding No Patient Has Alerts: Yes cellular tissue based products placed in the center Patient Alerts: R ABI = 1.0 since last visit: L ABI = 1.1 Has Dressing in Place as Prescribed: Yes Has Compression in Place as Prescribed: Yes Pain Present Now: No Electronic Signature(s) Signed: 08/02/2023 3:31:30 PM By: Samuella [name] Entered By: Samuella [name] on 08/02/2023 04:46:03 -------------------------------------------------------------------------------- Encounter Discharge Information Details Patient Name: Date of Service: [name], [name] 08/02/2023 7:45 [name] Medical Record Number: [id number] Patient Account Number: [id number] Date of Birth/Sex: Treating RN: 1986-01-20 (37 y.o. [name] Primary Care Alajiah Dutkiewicz: [name], [name] Other Clinician: Referring Sherman Donaldson: Treating [name]/Extender: [name] [name]: [phone] Encounter Discharge Information Items Post Procedure Vitals Discharge Condition: Stable Temperature (F): 98.2 Ambulatory Status: Wheelchair Pulse (bpm): 123 Discharge Destination: Home Respiratory Rate (breaths/min): 18 Transportation: Private Auto Blood Pressure (mmHg): 129/78 Accompanied By: self Schedule Follow-up Appointment: Yes Clinical Summary of Care: Patient Declined Electronic Signature(s) Signed: 08/02/2023 3:31:30 PM By: Samuella [name] Entered By: Samuella [name] on 08/02/2023 05:29:23 [name], [name] ([id number]) [id number] Page 2 of 17 -------------------------------------------------------------------------------- Lower Extremity Assessment Details Patient Name: Date of Service: [name], [name] 08/02/2023 7:45 [name] Medical Record Number: [id number] Patient Account Number: [id number] Date of Birth/Sex: Treating RN: [birth year] (37 y.o. [name] Primary Care Tifany Hirsch: [name], [name] Other Clinician: Referring Kimmarie Pascale: Treating Rilie Glanz/Extender: [name], [name] [name]: 395 Edema Assessment Assessed: [Left: No] [Right: No] Edema: [Left: Yes] [Right: Yes] Calf Left: Right: Point of Measurement: 33 cm From Medial Instep 31.5 cm 34.5 cm Ankle Left: Right: Point of Measurement: 10 cm From Medial Instep 25 cm 23.8 cm Vascular Assessment Pulses: Dorsalis Pedis Palpable: [Left:Yes] [Right:Yes] Extremity colors, hair growth, and conditions: Extremity Color: [Left:Normal] [Right:Normal] Hair Growth on Extremity: [Left:Yes] [Right:Yes] Temperature of Extremity: [Left:Warm < 3 seconds] [Right:Warm < 3 seconds] Electronic Signature(s) Signed: 08/02/2023 3:31:30 PM By: Samuella [name] Entered By: Samuella [name] on 08/02/2023 05:03:44 -------------------------------------------------------------------------------- Multi Wound Chart Details Patient  Name: Date of Service: [name], [name] 08/02/2023 7:45 [name] Medical Record Number: [id number] Patient Account Number: [id number] Date of Birth/Sex: Treating RN: 01/14/1986 (37 y.o. M) Primary Care Brennden Masten: [name], [name] Other Clinician: Referring Juleon Narang: Treating Analicia Skibinski/Extender: [name], [name] [name]: 395 Vital Signs Height(in): 70 Pulse(bpm): 123 Weight(lbs): 216 Blood Pressure(mmHg): 129/78 Body Mass Index(BMI): 31 Temperature(F): 98.2 Respiratory Rate(breaths/min): 18 [52:Photos: No Photos Right, Dorsal Foot Wound Location: Gradually Appeared Wounding Event: Venous Leg Ulcer Primary Etiology:] [56:No Photos Left, Dorsal Foot Gradually Appeared Neuropathic Ulcer-Non Diabetic] [67:No Photos Left, Lateral Lower Leg Gradually  Appeared  Venous Leg Ulcer] [name] ([id number]) [52:Sleep Apnea, Hypertension, Paraplegia Sleep Apnea, Hypertension, Paraplegia Sleep Apnea, Hypertension, Paraplegia Comorbid History: 03/27/2021 Date Acquired: 122 Weeks of Treatment: Open Wound Status: No Wound Recurrence: No Clustered Wound: [name]  Clustered Quantity: 1.4x3.3x0.1 Measurements L x W x D (cm) 3.629 [name] (cm) : rea 0.363 Volume (cm) : -92.50% % Reduction in Area: -93.10% % Reduction in Volume: Full Thickness Without Exposed Classification: Support Structures Medium Exudate [name] mount:  Serous Exudate Type: amber Exudate Color: Distinct, outline attached Wound Margin: Medium (34-66%) Granulation [name] mount: Red Granulation Quality: Medium (34-66%) Necrotic [name] mount: Adherent Slough Necrotic Tissue: Fat Layer (Subcutaneous Tissue): Yes Fat  Layer (Subcutaneous Tissue): Yes Fat Layer (Subcutaneous Tissue): Yes Exposed Structures: Fascia: No Tendon: No Muscle: No Joint: No Bone: No Medium (34-66%) Epithelialization: Debridement - Excisional Debridement: Pre-procedure Verification/Time Out  08:09 Taken: Subcutaneous, Slough Tissue Debrided: Skin/Subcutaneous Tissue Level: 3.63 Debridement  [name] (sq cm): rea Curette Instrument: Minimum Bleeding: Pressure Hemostasis [name] chieved: Procedure was tolerated well Debridement Treatment Response:  1.4x3.3x0.1 Post Debridement Measurements L x W x D (cm) 0.363 Post Debridement Volume: (cm) [name] Post Debridement Stage: No Abnormalities Noted Periwound Skin Texture: Maceration: No Periwound Skin Moisture: Dry/Scaly: No Rubor: Yes Periwound Skin Color:  No Abnormality Temperature: Debridement Procedures Performed:] [56:07/11/2021 106 Open No Yes [name] 1.5x4x0.1 4.712 0.471 24.20% 24.30% Full Thickness Without Exposed Support Structures Medium Serous amber Flat and Intact Small (1-33%) Red Large (67-100%)  Adherent Slough Fascia: No Tendon: No Muscle: No Joint: No Bone: No Small (1-33%) Debridement - Excisional 08:09 Subcutaneous, Slough Skin/Subcutaneous Tissue 4.71 Curette Minimum Pressure Procedure was tolerated well 1.5x4x0.1 0.471 [name] No Abnormalities  Noted Maceration: No Dry/Scaly: No Rubor: Yes Erythema: No No Abnormality Debridement] [id number] Page 3 of 17 06/22/2022 57 Open No No [name] 2.1x0.6x0.1 0.99 0.099 82.30% 82.40% Full Thickness Without Exposed Support  Structures Medium Serous amber Distinct, outline attached Small (1-33%) Red, Pink Large (67-100%) Adherent Slough Fascia: No Tendon: No Muscle: No Joint: No Bone: No Small (1-33%) Debridement - Selective/Open Wound 08:09 Slough Non-Viable Tissue 0.99  Curette Minimum Pressure Procedure was tolerated well 2.1x0.6x0.1 0.099 [name] Scarring: Yes Dry/Scaly: Yes No Abnormality Debridement] Wound Number: [id number] Photos: No Photos No Photos No Photos Left, Posterior Upper Leg Right, Anterior Ankle Left, Plantar Foot Wound Location: Pressure Injury Gradually Appeared Gradually Appeared Wounding Event: Pressure Ulcer Venous Leg Ulcer Lymphedema Primary Etiology: Sleep Apnea, Hypertension, Paraplegia Sleep Apnea, Hypertension, Paraplegia Sleep Apnea, Hypertension,  Paraplegia Comorbid History: 01/10/2023 07/04/2023 07/04/2023 Date Acquired: 28 3 3  Weeks of Treatment: Open Open Open Wound Status: No No No Wound Recurrence: Yes No No Clustered Wound: 5 [name] [name] Clustered Quantity: 13x7x0.1 1.1x0.5x0.1 1.5x1x0.1 Measurements L x W x D (cm) 71.471 0.432 1.178 [name] (cm) : rea 7.147 0.043 0.118 Volume (cm) : 4.20% 76.10% 16.70% % Reduction in [name] rea: 4.20% 76.20% 16.30% % Reduction in Volume: Category/Stage III Full Thickness Without Exposed Full Thickness Without Exposed Classification: Support Structures Support Structures Medium Medium Medium Exudate [name] mount: Serosanguineous Serous Serous Exudate Type: red, brown amber amber Exudate Color: Flat and Intact Flat and Intact Distinct, outline attached Wound Margin: Large (67-100%) Large (67-100%) Large (67-100%) Granulation Amount: Red, Friable Pink Red Granulation Quality: Small (1-33%) Small (1-33%) Small (1-33%) Necrotic Amount: Eschar Eschar, Adherent Slough Adherent Slough Necrotic Tissue: Fat Layer (Subcutaneous Tissue): Yes Fat Layer (Subcutaneous Tissue): Yes Fat Layer (Subcutaneous Tissue): Yes Exposed Structures: Fascia: No Fascia: No Fascia: No Tendon: No Tendon: No  Tendon: No Muscle: No Muscle: No Muscle: No Joint: No Joint: No Joint: No Bone: No Bone: No Bone: No Small (1-33%) Small (1-33%) Medium (34-66%) Epithelialization: Debridement - Selective/Open Wound Debridement - Selective/Open Wound [name] Debridement: Ask, Lanier [name] ([id number]) [id number] Page 4 of 17 08:09 08:09 [name] Pre-procedure Verification/Time Out Taken: [facility] [name] Tissue Debrided: Non-Viable Tissue Non-Viable Tissue [name] Level: 28.57 0.43 [name] Debridement [name] (sq cm): rea Curette Curette [name] Instrument: Minimum Minimum [name] Bleeding: Pressure Pressure [name] Hemostasis [name] chieved: Procedure was tolerated well Procedure was tolerated well [name] Debridement  Treatment Response: 13x7x0.1 1.1x0.5x0.1 [name] Post Debridement Measurements L x W x D (cm) 7.147 0.043 [name] Post Debridement Volume: (cm) Category/Stage III [name] [name] Post Debridement Stage: Scarring: Yes No Abnormalities Noted No Abnormalities Noted Periwound Skin Texture: Dry/Scaly: Yes No Abnormalities Noted Maceration: Yes Periwound Skin Moisture: Ecchymosis: Yes No Abnormalities Noted No Abnormalities Noted Periwound Skin Color: No Abnormality No Abnormality No Abnormality Temperature: Debridement Debridement [name] Procedures Performed: Wound Number: 80 [name] [name] Photos: No Photos [name] [name] Right, Plantar Foot [name] [name] Wound Location: Gradually Appeared [name] [name] Wounding Event: Lymphedema [name] [name] Primary Etiology: Sleep Apnea, Hypertension, Paraplegia [name] [name] Comorbid History: 07/04/2023 [name] [name] Date Acquired: 3 [name] [name] Weeks of Treatment: Open [name] [name] Wound Status: No [name] [name] Wound Recurrence: No [name] [name] Clustered Wound: [name] [name] [name] Clustered Quantity: 0.7x0.5x0.1 [name] [name] Measurements L x W x D (cm) 0.275 [name] [name] [name] (cm) : rea 0.027 [name] [name] Volume (cm) : 76.70% [name] [name] % Reduction in [name] rea: 77.10% [name] [name] % Reduction in Volume: Full Thickness Without Exposed [name] [name] Classification: Support Structures Medium [name] [name] Exudate [name] mount: Serous [name] [name] Exudate Type: amber [name] [name] Exudate Color: Flat and Intact [name] [name] Wound Margin: Large (67-100%) [name] [name] Granulation [name] mount: Red [name] [name] Granulation Quality: None Present (0%) [name] [name] Necrotic [name] mount: [name] [name] [name] Necrotic Tissue: Fat Layer (Subcutaneous Tissue): Yes [name] [name] Exposed Structures: Fascia: No Tendon: No Muscle: No Joint: No Bone: No Medium (34-66%) [name] [name] Epithelialization: Debridement - Selective/Open Wound [name] [name] Debridement: Pre-procedure Verification/Time Out 08:09 [name] [name] Taken: Slough [name] [name] Tissue Debrided: Non-Viable Tissue [name] [name] Level: 0.05 [name] [name] Debridement [name] (sq  cm): rea Curette [name] [name] Instrument: Minimum [name] [name] Bleeding: Pressure [name] [name] Hemostasis [name] chieved: Procedure was tolerated well [name] [name] Debridement Treatment Response: 0.7x0.5x0.1 [name] [name] Post Debridement Measurements L x W x D (cm) 0.027 [name] [name] Post Debridement Volume: (cm) [name] [name] [name] Post Debridement Stage: No Abnormalities Noted [name] [name] Periwound Skin Texture: Maceration: No [name] [name] Periwound Skin Moisture: Dry/Scaly: No No Abnormalities Noted [name] [name] Periwound Skin Color: No Abnormality [name] [name] Temperature: Debridement [name] [name] Procedures Performed: Treatment Notes Electronic Signature(s) Signed: 08/02/2023 8:16:31 AM By: Duanne Guess MD FACS Entered By: Duanne Guess on 08/02/2023 05:16:31 Zellmer, [name] ([id number]) [id number] Page 5 of 17 -------------------------------------------------------------------------------- Multi-Disciplinary Care Plan Details Patient Name: Date of Service: [place], [name] 08/02/2023 7:45 [name] Medical Record Number: [id number] Patient Account Number: [id number] Date of Birth/Sex: Treating RN: 08-04-1986 (37 y.o. [name] Primary Care Mellina Benison: [name], [name] Other Clinician: Referring Darlen Gledhill: Treating Ken Bonn/Extender: [name] [name]: 395 Multidisciplinary Care Plan reviewed with physician Active Inactive Pressure Nursing Diagnoses: Knowledge deficit related to management of pressures ulcers Potential for impaired tissue integrity related to pressure, friction, moisture, and shear Goals: Patient will remain free from development of additional pressure ulcers Date Initiated:  06/21/2018 Date Inactivated: 02/12/2019 Target Resolution Date: 02/16/2019 Unmet Reason: pressure ulcer Goal Status: Unmet reopened on left ischium Patient/caregiver will verbalize risk factors for pressure ulcer development Date Initiated: 06/21/2018 Date Inactivated:  05/28/2019 Target Resolution Date: 06/01/2019 Goal Status: Met Patient/caregiver will verbalize understanding of pressure ulcer management Date Initiated: 06/01/2022 Target Resolution Date: 09/23/2023 Goal Status: Active Interventions: Assess: immobility, friction, shearing, incontinence upon admission and as needed Assess offloading mechanisms upon admission and as needed Assess potential for pressure ulcer upon admission and as needed Treatment Activities: Pressure reduction/relief device ordered : 12/05/2017 Notes: Venous Leg Ulcer Nursing Diagnoses: Knowledge deficit related to disease process and management Potential for venous Insuffiency (use before diagnosis confirmed) Goals: Non-invasive venous studies are completed as ordered Date Initiated: 01/05/2016 Date Inactivated: 09/15/2017 Target Resolution Date: 01/20/2017 Goal Status: Met Patient will maintain optimal edema control Date Initiated: 01/05/2016 Target Resolution Date: 09/23/2023 Goal Status: Active Patient/caregiver will verbalize understanding of disease process and disease management Date Initiated: 01/05/2016 Date Inactivated: 09/15/2017 Target Resolution Date: 01/20/2017 Goal Status: Met Verify adequate tissue perfusion prior to therapeutic compression application Date Initiated: 01/05/2016 Date Inactivated: 09/15/2017 Target Resolution Date: 01/20/2017 Goal Status: Met Interventions: Assess peripheral edema status every visit. Compression as ordered Provide education on venous insufficiency Treatment Activities: Non-invasive vascular studies : 03/11/2016 Therapeutic compression applied : 03/11/2016 Herbold, Berlyn [name] ([id number]) [phone].pdf Page 6 of 17 Notes: Wound/Skin Impairment Nursing Diagnoses: Impaired tissue integrity Knowledge deficit related to ulceration/compromised skin integrity Goals: Patient/caregiver will verbalize understanding of skin care regimen Date Initiated:  06/01/2022 Target Resolution Date: 09/30/2023 Goal Status: Active Ulcer/skin breakdown will have [name] volume reduction of 30% by week 4 Date Initiated: 06/01/2022 Date Inactivated: 06/29/2022 Target Resolution Date: 06/29/2022 Unmet Reason: complex wounds, Goal Status: Unmet infection Interventions: Assess patient/caregiver ability to obtain necessary supplies Assess ulceration(s) every visit Provide education on ulcer and skin care Notes: 02/02/21: Complex Care, ongoing. Electronic Signature(s) Signed: 08/02/2023 3:31:30 PM By: Samuella [name] Entered By: Samuella [name] on 08/02/2023 05:28:11 -------------------------------------------------------------------------------- Pain Assessment Details Patient Name: Date of Service: [name], [name] 08/02/2023 7:45 [name] Medical Record Number: [id number] Patient Account Number: [id number] Date of Birth/Sex: Treating RN: 1986-03-04 (37 y.o. [name] Primary Care Chavonne Sforza: [name], [name] Other Clinician: Referring Viana Sleep: Treating Jaeven Wanzer/Extender: [name] [name]: 395 Active Problems Location of Pain Severity and Description of Pain Patient Has Paino No Site Locations Rate the pain. Current Pain Level: 0 Pain Management and Medication Current Pain Management: Electronic Signature(s) Dech, Bandon [name] ([id number]) [id number] Page 7 of 17 Signed: 08/02/2023 3:31:30 PM By: Gelene Mink By: Samuella [name] on 08/02/2023 04:47:45 -------------------------------------------------------------------------------- Patient/Caregiver Education Details Patient Name: Date of Service: [name], [name] 9/10/2024andnbsp7:45 [name] Medical Record Number: [id number] Patient Account Number: [id number] Date of Birth/Gender: Treating RN: April 27, 1986 (37 y.o. [name] Primary Care Physician: Eunice Blase Other Clinician: Referring Physician: Treating  Physician/Extender: [name] [name]: 395 Education Assessment Education Provided To: Patient Education Topics Provided Pressure: Methods: Explain/Verbal Responses: Reinforcements needed, State content correctly Electronic Signature(s) Signed: 08/02/2023 3:31:30 PM By: Samuella [name] Entered By: Samuella [name] on 08/02/2023 05:28:30 -------------------------------------------------------------------------------- Wound Assessment Details Patient Name: Date of Service: [name], [name] 08/02/2023 7:45 [name] Medical Record Number: [id number] Patient Account Number: [id number] Date of Birth/Sex: Treating RN: 1986-03-01 (36 y.o. [name] Primary Care Truman Aceituno: [name], [name] Other Clinician: Referring Shayne Deerman: Treating Amora Sheehy/Extender: [name], [name] [name]: 395 Wound Status Wound Number:  52 Primary Etiology: Venous Leg Ulcer Wound Location: Right, Dorsal Foot Wound Status: Open Wounding Event: Gradually Appeared Comorbid History: Sleep Apnea, Hypertension, Paraplegia Date Acquired: 03/27/2021 Weeks Of Treatment: 122 Clustered Wound: No Wound Measurements Length: (cm) Width: (cm) Depth: (cm) Area: (cm) Volume: (cm) 1.4 % Reduction in Area: -92.5% 3.3 % Reduction in Volume: -93.1% 0.1 Epithelialization: Medium (34-66%) 3.629 Tunneling: No 0.363 Undermining: No Wound Description Classification: Full Thickness Without Exposed Support Structures Wound Margin: Distinct, outline attached Exudate Amount: Medium Exudate Type: Serous Kendricks, Reginaldo [name] ([id number]) Exudate Color: amber Foul Odor After Cleansing: No Slough/Fibrino Yes [phone].pdf Page 8 of 17 Wound Bed Granulation Amount: Medium (34-66%) Exposed Structure Granulation Quality: Red Fascia Exposed: No Necrotic Amount: Medium (34-66%) Fat Layer (Subcutaneous Tissue) Exposed: Yes Necrotic Quality: Adherent  Slough Tendon Exposed: No Muscle Exposed: No Joint Exposed: No Bone Exposed: No Periwound Skin Texture Texture Color No Abnormalities Noted: Yes No Abnormalities Noted: No Rubor: Yes Moisture No Abnormalities Noted: No Temperature / Pain Dry / Scaly: No Temperature: No Abnormality Maceration: No Treatment Notes Wound #52 (Foot) Wound Laterality: Dorsal, Right Cleanser Soap and Water Discharge Instruction: May shower and wash wound with dial antibacterial soap and water prior to dressing change. Wound Cleanser Discharge Instruction: Cleanse the wound with wound cleanser prior to applying [name] clean dressing using gauze sponges, not tissue or cotton balls. Peri-Wound Care Sween Lotion (Moisturizing lotion) Discharge Instruction: Apply Aquaphor moisturizing lotion as directed Topical Mupirocin Ointment Discharge Instruction: Apply Mupirocin (Bactroban) as instructed Primary Dressing Maxorb Extra Ag+ Alginate Dressing, 2x2 (in/in) Discharge Instruction: Apply to wound bed as instructed Secondary Dressing Woven Gauze Sponge, Non-Sterile 4x4 in Discharge Instruction: Apply over primary dressing as directed. Secured With [employer], 4.5x3.1 (in/yd) Discharge Instruction: Secure with Kerlix as directed. 61M Medipore H Soft Cloth Surgical T ape, 4 x 10 (in/yd) Discharge Instruction: Secure with tape as directed. Compression Wrap Compression Stockings Add-Ons Electronic Signature(s) Signed: 08/02/2023 3:31:30 PM By: Samuella [name] Entered By: Samuella [name] on 08/02/2023 04:58:36 -------------------------------------------------------------------------------- Wound Assessment Details Patient Name: Date of Service: [name], [name] 08/02/2023 7:45 [name] Medical Record Number: [id number] Patient Account Number: [id number] Date of Birth/Sex: Treating RN: November 15, 1986 (37 y.o. [name] Kempen, [name] ([id number]) [id number] Page 9 of  17 Primary Care Roxann Vierra: [name], [name] Other Clinician: Referring Kimi Bordeau: Treating Carle Dargan/Extender: [name], [name] [name]: 395 Wound Status Wound Number: 56 Primary Etiology: Neuropathic Ulcer-Non Diabetic Wound Location: Left, Dorsal Foot Wound Status: Open Wounding Event: Gradually Appeared Comorbid History: Sleep Apnea, Hypertension, Paraplegia Date Acquired: 07/11/2021 Weeks Of Treatment: 106 Clustered Wound: Yes Wound Measurements Length: (cm) 1.5 Width: (cm) 4 Depth: (cm) 0.1 Area: (cm) 4.712 Volume: (cm) 0.471 % Reduction in Area: 24.2% % Reduction in Volume: 24.3% Epithelialization: Small (1-33%) Tunneling: No Undermining: No Wound Description Classification: Full Thickness Without Exposed Support Structures Wound Margin: Flat and Intact Exudate Amount: Medium Exudate Type: Serous Exudate Color: amber Foul Odor After Cleansing: No Slough/Fibrino Yes Wound Bed Granulation Amount: Small (1-33%) Exposed Structure Granulation Quality: Red Fascia Exposed: No Necrotic Amount: Large (67-100%) Fat Layer (Subcutaneous Tissue) Exposed: Yes Necrotic Quality: Adherent Slough Tendon Exposed: No Muscle Exposed: No Joint Exposed: No Bone Exposed: No Periwound Skin Texture Texture Color No Abnormalities Noted: Yes No Abnormalities Noted: No Erythema: No Moisture Rubor: Yes No Abnormalities Noted: No Dry / Scaly: No Temperature / Pain Maceration: No Temperature: No Abnormality Treatment Notes Wound #56 (Foot) Wound Laterality: Dorsal, Left Cleanser Soap and Water Discharge  Instruction: May shower and wash wound with dial antibacterial soap and water prior to dressing change. Wound Cleanser Discharge Instruction: Cleanse the wound with wound cleanser prior to applying [name] clean dressing using gauze sponges, not tissue or cotton balls. Peri-Wound Care Sween Lotion (Moisturizing lotion) Discharge Instruction: Apply Aquaphor  moisturizing lotion as directed Topical Mupirocin Ointment Discharge Instruction: Apply Mupirocin (Bactroban) as instructed Primary Dressing Maxorb Extra Ag+ Alginate Dressing, 2x2 (in/in) Discharge Instruction: Apply to wound bed as instructed Secondary Dressing Woven Gauze Sponge, Non-Sterile 4x4 in Discharge Instruction: Apply over primary dressing as directed. Secured With [employer], 4.5x3.1 (in/yd) Discharge Instruction: Secure with Kerlix as directed. Carollo, JEFRI PEZZA ([id number]) [id number].pdf Page 10 of 17 79M Medipore H Soft Cloth Surgical T ape, 4 x 10 (in/yd) Discharge Instruction: Secure with tape as directed. Compression Wrap Compression Stockings Add-Ons Electronic Signature(s) Signed: 08/02/2023 3:31:30 PM By: Samuella [name] Entered By: Samuella [name] on 08/02/2023 04:59:13 -------------------------------------------------------------------------------- Wound Assessment Details Patient Name: Date of Service: [name], [name] 08/02/2023 7:45 [name] Medical Record Number: [id number] Patient Account Number: [id number] Date of Birth/Sex: Treating RN: 08-Jun-1986 (37 y.o. [name] Primary Care Ottis Vacha: [name], [name] Other Clinician: Referring Yasuo Phimmasone: Treating Mamadou Breon/Extender: [name], [name] [name]: 395 Wound Status Wound Number: 67 Primary Etiology: Venous Leg Ulcer Wound Location: Left, Lateral Lower Leg Wound Status: Open Wounding Event: Gradually Appeared Comorbid History: Sleep Apnea, Hypertension, Paraplegia Date Acquired: 06/22/2022 Weeks Of Treatment: 57 Clustered Wound: No Wound Measurements Length: (cm) 2.1 Width: (cm) 0.6 Depth: (cm) 0.1 Area: (cm) 0.99 Volume: (cm) 0.099 % Reduction in Area: 82.3% % Reduction in Volume: 82.4% Epithelialization: Small (1-33%) Tunneling: No Undermining: No Wound Description Classification: Full Thickness Without Exposed Support  Structures Wound Margin: Distinct, outline attached Exudate Amount: Medium Exudate Type: Serous Exudate Color: amber Foul Odor After Cleansing: No Slough/Fibrino No Wound Bed Granulation Amount: Small (1-33%) Exposed Structure Granulation Quality: Red, Pink Fascia Exposed: No Necrotic Amount: Large (67-100%) Fat Layer (Subcutaneous Tissue) Exposed: Yes Necrotic Quality: Adherent Slough Tendon Exposed: No Muscle Exposed: No Joint Exposed: No Bone Exposed: No Periwound Skin Texture Texture Color No Abnormalities Noted: No No Abnormalities Noted: Yes Scarring: Yes Temperature / Pain Temperature: No Abnormality Moisture No Abnormalities Noted: No Dry / Scaly: Yes Treatment Notes Wound #67 (Lower Leg) Wound Laterality: Left, Lateral Cleanser Strebel, Minh [name] ([id number]) [id number] Page 11 of 17 Soap and Water Discharge Instruction: May shower and wash wound with dial antibacterial soap and water prior to dressing change. Wound Cleanser Discharge Instruction: Cleanse the wound with wound cleanser prior to applying [name] clean dressing using gauze sponges, not tissue or cotton balls. Peri-Wound Care Sween Lotion (Moisturizing lotion) Discharge Instruction: Apply Aquaphor moisturizing lotion as directed Topical Mupirocin Ointment Discharge Instruction: Apply Mupirocin (Bactroban) as instructed Primary Dressing Maxorb Extra Ag+ Alginate Dressing, 2x2 (in/in) Discharge Instruction: Apply to wound bed as instructed Secondary Dressing Woven Gauze Sponge, Non-Sterile 4x4 in Discharge Instruction: Apply over primary dressing as directed. Secured With [employer], 4.5x3.1 (in/yd) Discharge Instruction: Secure with Kerlix as directed. 79M Medipore H Soft Cloth Surgical T ape, 4 x 10 (in/yd) Discharge Instruction: Secure with tape as directed. Compression Wrap Compression Stockings Add-Ons Electronic Signature(s) Signed: 08/02/2023 3:31:30 PM By:  Samuella [name] Entered By: Samuella [name] on 08/02/2023 04:59:53 -------------------------------------------------------------------------------- Wound Assessment Details Patient Name: Date of Service: Bianchini, [name] 08/02/2023 7:45 [name] Medical Record Number: [id number] Patient Account Number: [id number] Date of Birth/Sex: Treating RN: 1986/11/22 (36 y.o.  [name] Primary Care Keavon Sensing: [name], [name] Other Clinician: Referring Dannya Pitkin: Treating Drenda Sobecki/Extender: [name], [name] [name]: 395 Wound Status Wound Number: 74 Primary Etiology: Pressure Ulcer Wound Location: Left, Posterior Upper Leg Wound Status: Open Wounding Event: Pressure Injury Comorbid History: Sleep Apnea, Hypertension, Paraplegia Date Acquired: 01/10/2023 Weeks Of Treatment: 28 Clustered Wound: Yes Wound Measurements Length: (cm) Width: (cm) Depth: (cm) Clustered Quantity: Area: (cm) Volume: (cm) 13 % Reduction in Area: 4.2% 7 % Reduction in Volume: 4.2% 0.1 Epithelialization: Small (1-33%) 5 Tunneling: No 71.471 Undermining: No 7.147 Wound Description Classification: Category/Stage III Wound Margin: Flat and Intact Exudate Amount: Medium Franek, Nijel [name] ([id number]) Exudate Type: Serosanguineous Exudate Color: red, brown Foul Odor After Cleansing: No Slough/Fibrino No [phone].pdf Page 12 of 17 Wound Bed Granulation Amount: Large (67-100%) Exposed Structure Granulation Quality: Red, Friable Fascia Exposed: No Necrotic Amount: Small (1-33%) Fat Layer (Subcutaneous Tissue) Exposed: Yes Necrotic Quality: Eschar Tendon Exposed: No Muscle Exposed: No Joint Exposed: No Bone Exposed: No Periwound Skin Texture Texture Color No Abnormalities Noted: No No Abnormalities Noted: Yes Scarring: Yes Temperature / Pain Temperature: No Abnormality Moisture No Abnormalities Noted: Yes Treatment Notes Wound #74 (Upper Leg) Wound  Laterality: Left, Posterior Cleanser Vashe 5.8 (oz) Discharge Instruction: Cleanse the wound with Vashe prior to applying [name] clean dressing using gauze sponges , let sit on wound for 10 minutes Peri-Wound Care Zinc Oxide Ointment 30g tube Discharge Instruction: Apply Zinc Oxide to periwound with each dressing change as needed fpr moisture Topical Keystone antibiotic compound Primary Dressing Maxorb Extra Ag+ Alginate Dressing, 4x4.75 (in/in) Discharge Instruction: Apply to wound bed as instructed Secondary Dressing Zetuvit Plus Silicone Border Sacrum Dressing, Sm, 7x7 (in/in) Discharge Instruction: Apply silicone border over primary dressing as directed. Secured With 74M Medipore H Soft Cloth Surgical T ape, 4 x 10 (in/yd) Discharge Instruction: Secure with tape as directed. Compression Wrap Compression Stockings Add-Ons Electronic Signature(s) Signed: 08/02/2023 3:31:30 PM By: Samuella [name] Entered By: Samuella [name] on 08/02/2023 05:00:20 -------------------------------------------------------------------------------- Wound Assessment Details Patient Name: Date of Service: Seaman, [name] 08/02/2023 7:45 [name] Medical Record Number: [id number] Patient Account Number: [id number] Date of Birth/Sex: Treating RN: 10/19/1986 (37 y.o. [name] Primary Care Armonie Staten: [name], [name] Other Clinician: Referring Marlin Jarrard: Treating Erminia Mcnew/Extender: [name], [name] [name]: 395 Wound Status Wound Number: 78 Primary Etiology: Venous Leg Ulcer Diener, Menachem [name] ([id number]) [id number] Page 13 of 17 Wound Location: Right, Anterior Ankle Wound Status: Open Wounding Event: Gradually Appeared Comorbid History: Sleep Apnea, Hypertension, Paraplegia Date Acquired: 07/04/2023 Weeks Of Treatment: 3 Clustered Wound: No Wound Measurements Length: (cm) 1.1 Width: (cm) 0.5 Depth: (cm) 0.1 Area: (cm) 0.432 Volume: (cm) 0.043 %  Reduction in Area: 76.1% % Reduction in Volume: 76.2% Epithelialization: Small (1-33%) Tunneling: No Undermining: No Wound Description Classification: Full Thickness Without Exposed Support Structures Wound Margin: Flat and Intact Exudate Amount: Medium Exudate Type: Serous Exudate Color: amber Foul Odor After Cleansing: No Slough/Fibrino Yes Wound Bed Granulation Amount: Large (67-100%) Exposed Structure Granulation Quality: Pink Fascia Exposed: No Necrotic Amount: Small (1-33%) Fat Layer (Subcutaneous Tissue) Exposed: Yes Necrotic Quality: Eschar, Adherent Slough Tendon Exposed: No Muscle Exposed: No Joint Exposed: No Bone Exposed: No Periwound Skin Texture Texture Color No Abnormalities Noted: Yes No Abnormalities Noted: Yes Moisture Temperature / Pain No Abnormalities Noted: Yes Temperature: No Abnormality Treatment Notes Wound #78 (Ankle) Wound Laterality: Right, Anterior Cleanser Soap and Water Discharge Instruction: May shower and wash wound with dial antibacterial soap  and water prior to dressing change. Wound Cleanser Discharge Instruction: Cleanse the wound with wound cleanser prior to applying [name] clean dressing using gauze sponges, not tissue or cotton balls. Peri-Wound Care Sween Lotion (Moisturizing lotion) Discharge Instruction: Apply Aquaphor moisturizing lotion as directed Topical Mupirocin Ointment Discharge Instruction: Apply Mupirocin (Bactroban) as instructed Primary Dressing Maxorb Extra Ag+ Alginate Dressing, 2x2 (in/in) Discharge Instruction: Apply to wound bed as instructed Secondary Dressing Woven Gauze Sponge, Non-Sterile 4x4 in Discharge Instruction: Apply over primary dressing as directed. Secured With [employer], 4.5x3.1 (in/yd) Discharge Instruction: Secure with Kerlix as directed. 36M Medipore H Soft Cloth Surgical T ape, 4 x 10 (in/yd) Discharge Instruction: Secure with tape as directed. Compression Wrap Compression  Stockings Add-Ons Gnau, Quincey [name] ([id number]) [id number] Page 14 of 17 Electronic Signature(s) Signed: 08/02/2023 3:31:30 PM By: Samuella [name] Entered By: Samuella [name] on 08/02/2023 05:00:53 -------------------------------------------------------------------------------- Wound Assessment Details Patient Name: Date of Service: [name], [name] 08/02/2023 7:45 [name] Medical Record Number: [id number] Patient Account Number: [id number] Date of Birth/Sex: Treating RN: October 31, 1986 (37 y.o. [name] Primary Care Thurman Sarver: [name], [name] Other Clinician: Referring Mearle Drew: Treating Cristian Grieves/Extender: [name], [name] [name]: 395 Wound Status Wound Number: 79 Primary Etiology: Lymphedema Wound Location: Left, Plantar Foot Wound Status: Open Wounding Event: Gradually Appeared Comorbid History: Sleep Apnea, Hypertension, Paraplegia Date Acquired: 07/04/2023 Weeks Of Treatment: 3 Clustered Wound: No Wound Measurements Length: (cm) 1.5 Width: (cm) 1 Depth: (cm) 0.1 Area: (cm) 1.178 Volume: (cm) 0.118 % Reduction in Area: 16.7% % Reduction in Volume: 16.3% Epithelialization: Medium (34-66%) Tunneling: No Undermining: No Wound Description Classification: Full Thickness Without Exposed Support Structures Wound Margin: Distinct, outline attached Exudate Amount: Medium Exudate Type: Serous Exudate Color: amber Foul Odor After Cleansing: No Slough/Fibrino Yes Wound Bed Granulation Amount: Large (67-100%) Exposed Structure Granulation Quality: Red Fascia Exposed: No Necrotic Amount: Small (1-33%) Fat Layer (Subcutaneous Tissue) Exposed: Yes Necrotic Quality: Adherent Slough Tendon Exposed: No Muscle Exposed: No Joint Exposed: No Bone Exposed: No Periwound Skin Texture Texture Color No Abnormalities Noted: Yes No Abnormalities Noted: Yes Moisture Temperature / Pain No Abnormalities Noted: Yes Temperature: No  Abnormality Treatment Notes Wound #79 (Foot) Wound Laterality: Plantar, Left Cleanser Soap and Water Discharge Instruction: May shower and wash wound with dial antibacterial soap and water prior to dressing change. Wound Cleanser Discharge Instruction: Cleanse the wound with wound cleanser prior to applying [name] clean dressing using gauze sponges, not tissue or cotton balls. Peri-Wound Care Sween Lotion (Moisturizing lotion) Discharge Instruction: Apply Aquaphor moisturizing lotion as directed Zavaleta, Satya [name] ([id number]) [id number] Page 15 of 17 Topical Mupirocin Ointment Discharge Instruction: Apply Mupirocin (Bactroban) as instructed Primary Dressing Maxorb Extra Ag+ Alginate Dressing, 2x2 (in/in) Discharge Instruction: Apply to wound bed as instructed Secondary Dressing Woven Gauze Sponge, Non-Sterile 4x4 in Discharge Instruction: Apply over primary dressing as directed. Secured With [employer], 4.5x3.1 (in/yd) Discharge Instruction: Secure with Kerlix as directed. 36M Medipore H Soft Cloth Surgical T ape, 4 x 10 (in/yd) Discharge Instruction: Secure with tape as directed. Compression Wrap Compression Stockings Add-Ons Electronic Signature(s) Signed: 08/02/2023 3:31:30 PM By: Samuella [name] Entered By: Samuella [name] on 08/02/2023 05:02:28 -------------------------------------------------------------------------------- Wound Assessment Details Patient Name: Date of Service: Altmann, [name] 08/02/2023 7:45 [name] Medical Record Number: [id number] Patient Account Number: [id number] Date of Birth/Sex: Treating RN: 08-01-1986 (37 y.o. [name] Primary Care [name]: [name], [name] Other Clinician: Referring Shelbee Apgar: Treating Janny Crute/Extender: [name], [name] [name]: [phone]  Wound Status Wound Number: 80 Primary Etiology: Lymphedema Wound Location: Right, Plantar Foot Wound Status: Open Wounding Event:  Gradually Appeared Comorbid History: Sleep Apnea, Hypertension, Paraplegia Date Acquired: 07/04/2023 Weeks Of Treatment: 3 Clustered Wound: No Wound Measurements Length: (cm) Width: (cm) Depth: (cm) Area: (cm) Volume: (cm) 0.7 % Reduction in Area: 76.7% 0.5 % Reduction in Volume: 77.1% 0.1 Epithelialization: Medium (34-66%) 0.275 Tunneling: No 0.027 Undermining: No Wound Description Classification: Full Thickness Without Exposed Suppo Wound Margin: Flat and Intact Exudate Amount: Medium Exudate Type: Serous Exudate Color: amber rt Structures Foul Odor After Cleansing: No Slough/Fibrino No Wound Bed Granulation Amount: Large (67-100%) Exposed Structure Granulation Quality: Red Fascia Exposed: No Necrotic Amount: None Present (0%) Fat Layer (Subcutaneous Tissue) Exposed: Yes Tendon Exposed: No Muscle Exposed: No Joint Exposed: No Loscalzo, Malikiah [name] ([id number]) [id number] Page 16 of 17 Bone Exposed: No Periwound Skin Texture Texture Color No Abnormalities Noted: Yes No Abnormalities Noted: Yes Moisture Temperature / Pain No Abnormalities Noted: No Temperature: No Abnormality Dry / Scaly: No Maceration: No Treatment Notes Wound #80 (Foot) Wound Laterality: Plantar, Right Cleanser Soap and Water Discharge Instruction: May shower and wash wound with dial antibacterial soap and water prior to dressing change. Wound Cleanser Discharge Instruction: Cleanse the wound with wound cleanser prior to applying [name] clean dressing using gauze sponges, not tissue or cotton balls. Peri-Wound Care Sween Lotion (Moisturizing lotion) Discharge Instruction: Apply Aquaphor moisturizing lotion as directed Topical Mupirocin Ointment Discharge Instruction: Apply Mupirocin (Bactroban) as instructed Primary Dressing Maxorb Extra Ag+ Alginate Dressing, 2x2 (in/in) Discharge Instruction: Apply to wound bed as instructed Secondary Dressing Woven Gauze Sponge,  Non-Sterile 4x4 in Discharge Instruction: Apply over primary dressing as directed. Secured With [employer], 4.5x3.1 (in/yd) Discharge Instruction: Secure with Kerlix as directed. 70M Medipore H Soft Cloth Surgical T ape, 4 x 10 (in/yd) Discharge Instruction: Secure with tape as directed. Compression Wrap Compression Stockings Add-Ons Electronic Signature(s) Signed: 08/02/2023 3:31:30 PM By: Samuella [name] Entered By: Samuella [name] on 08/02/2023 05:01:55 -------------------------------------------------------------------------------- Vitals Details Patient Name: Date of Service: Marty, [name] 08/02/2023 7:45 [name] Medical Record Number: [id number] Patient Account Number: [id number] Date of Birth/Sex: Treating RN: 1986-09-11 (37 y.o. [name] Primary Care Varun Jourdan: [name], [name] Other Clinician: Referring Pressley Tadesse: Treating Trinaty Bundrick/Extender: [name], [name] [name]: 395 Vital Signs Time Taken: 07:47 Temperature (F): 98.2 Height (in): 70 Pulse (bpm): 123 Weight (lbs): 216 Respiratory Rate (breaths/min): 18 Levitz, Calogero [name] ([id number]) [id number] Page 17 of 17 Body Mass Index (BMI): 31 Blood Pressure (mmHg): 129/78 Reference Range: 80 - 120 mg / dl Electronic Signature(s) Signed: 08/02/2023 3:31:30 PM By: Samuella [name] Entered By: Samuella [name] on 08/02/2023 04:47:39

## 2023-08-23 ENCOUNTER — Encounter (HOSPITAL_BASED_OUTPATIENT_CLINIC_OR_DEPARTMENT_OTHER): Payer: BC Managed Care – PPO | Attending: General Surgery | Admitting: General Surgery

## 2023-08-23 DIAGNOSIS — G8220 Paraplegia, unspecified: Secondary | ICD-10-CM | POA: Diagnosis not present

## 2023-08-23 DIAGNOSIS — L97512 Non-pressure chronic ulcer of other part of right foot with fat layer exposed: Secondary | ICD-10-CM | POA: Diagnosis not present

## 2023-08-23 DIAGNOSIS — I872 Venous insufficiency (chronic) (peripheral): Secondary | ICD-10-CM | POA: Diagnosis not present

## 2023-08-23 DIAGNOSIS — L89893 Pressure ulcer of other site, stage 3: Secondary | ICD-10-CM | POA: Diagnosis not present

## 2023-08-23 DIAGNOSIS — L97822 Non-pressure chronic ulcer of other part of left lower leg with fat layer exposed: Secondary | ICD-10-CM | POA: Diagnosis not present

## 2023-08-23 DIAGNOSIS — L97522 Non-pressure chronic ulcer of other part of left foot with fat layer exposed: Secondary | ICD-10-CM | POA: Insufficient documentation

## 2023-08-25 NOTE — Progress Notes (Signed)
Bozzo, MARQUIS DOWN ([id number]) [id number] Page 1 of 18 Visit Report for 08/23/2023 Arrival Information Details Patient Name: Date of Service: [name] [name]. 08/23/2023 8:15 [name] Medical Record Number: [id number] Patient Account Number: [id number] Date of Birth/Sex: Treating RN: Mar 26, 1986 (37 y.o. Damaris Schooner Primary Care Trystan Eads: [name], [name] Other Clinician: Referring Illene Sweeting: Treating Quadry Kampa/Extender: Fredderick Severance [name]: 398 Visit Information History Since Last Visit Added or deleted any medications: No Patient Arrived: Wheel Chair Any new allergies or adverse reactions: No Arrival Time: 08:28 Had [name] fall or experienced change in No Accompanied By: self activities of daily living that may affect Transfer Assistance: None risk of falls: Patient Identification Verified: Yes Signs or symptoms of abuse/neglect since last visito No Secondary Verification Process Completed: Yes Hospitalized since last visit: No Patient Requires Transmission-Based Precautions: No Implantable device outside of the clinic excluding No Patient Has Alerts: Yes cellular tissue based products placed in the center Patient Alerts: R ABI = 1.0 since last visit: L ABI = 1.1 Has Dressing in Place as Prescribed: Yes Has Compression in Place as Prescribed: Yes Pain Present Now: No Electronic Signature(s) Signed: 08/24/2023 5:18:43 PM By: Zenaida Deed RN, BSN Entered By: Zenaida Deed on 08/23/2023 05:31:25 -------------------------------------------------------------------------------- Lower Extremity Assessment Details Patient Name: Date of Service: [name] [name] 08/23/2023 8:15 [name] Medical Record Number: [id number] Patient Account Number: [id number] Date of Birth/Sex: Treating RN: Oct 26, 1986 (37 y.o. Damaris Schooner Primary Care Ady Heimann: [name], [name] Other Clinician: Referring Americus Perkey: Treating Elvia Aydin/Extender: Benjamine Mola, [name]: 398 Edema Assessment Assessed: [Left: No] [Right: No] Edema: [Left: Yes] [Right: Yes] Calf Left: Right: Point of Measurement: 33 cm From Medial Instep 30.5 cm 33 cm Ankle Left: Right: Point of Measurement: 10 cm From Medial Instep 25 cm 23.3 cm Vascular Assessment Extremity colors, hair growth, and conditions: Extremity Color: [Left:Normal] [Right:Normal] Hair Growth on Extremity: [Left:Yes] [Right:Yes] [name], [name] ([id number]) [[id number] Page 2 of 18] Temperature of Extremity: [Left:Warm < 3 seconds] [Right:Warm < 3 seconds] Electronic Signature(s) Signed: 08/24/2023 5:18:43 PM By: Zenaida Deed RN, BSN Entered By: Zenaida Deed on 08/23/2023 05:41:50 -------------------------------------------------------------------------------- Multi Wound Chart Details Patient Name: Date of Service: [name] [name] 08/23/2023 8:15 [name] Medical Record Number: [id number] Patient Account Number: [id number] Date of Birth/Sex: Treating RN: 1986-01-31 (37 y.o. 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 Primary Etiology: Sleep Apnea, Hypertension, Paraplegia Sleep Apnea, Hypertension, Paraplegia Sleep Apnea, Hypertension, Paraplegia Comorbid History: 03/27/2021 07/11/2021 06/22/2022 Date Acquired: 125 109 60 Weeks of Treatment: Open Open Open Wound Status: No No No Wound Recurrence: No Yes No Clustered  Wound: [name] [name] [name] Clustered Quantity: 1.2x4.4x0.1 0.9x3x0.1 1.8x0.6x0.1 Measurements L x W x D (cm) 4.147 2.121 0.848 [name] (cm) : rea 0.415 0.212 0.085 Volume (cm) : -120.00% 65.90% 84.90% % Reduction in [name] rea: -120.70% 65.90% 84.80% % Reduction in Volume: Full Thickness Without Exposed Full Thickness Without Exposed Full Thickness Without Exposed Classification: Support Structures Support Structures Support Structures Medium Medium Medium Exudate [name] mount: Serous Serous Serous Exudate Type: amber amber amber Exudate Color: Distinct, outline attached Flat and Intact  Distinct, outline attached Wound Margin: Small (1-33%) Large (67-100%) Small (1-33%) Granulation [name] mount: Red Red Red, Pink Granulation Quality: Medium (34-66%) Small (1-33%) Large (67-100%) Necrotic [name] mount: Adherent Slough Adherent [name] Necrotic Tissue: Fat Layer (Subcutaneous Tissue): Yes Fat Layer (Subcutaneous Tissue): Yes Fat Layer (Subcutaneous Tissue): Yes Exposed Structures: Fascia: No Fascia: No Fascia: No Tendon: No Tendon: No Tendon: No Muscle: No Muscle: No Muscle: No Joint: No Joint: No Joint: No Bone: No Bone: No Bone: No Medium (34-66%) Small (1-33%) Small (1-33%) Epithelialization: Debridement - Excisional Debridement - Excisional Debridement - Excisional Debridement: Pre-procedure Verification/Time Out 09:00 09:00 09:00 Taken: Subcutaneous, Slough Subcutaneous, [employer], Slough Tissue Debrided: [name] [name] [name] [id number]) [id number] Page 3 of 18 Skin/Subcutaneous Tissue Skin/Subcutaneous Tissue Skin/Subcutaneous Tissue Level: 4.14 2.12 0.85 Debridement [name] (sq cm): rea Curette Curette Curette Instrument: Minimum Minimum Minimum Bleeding: Pressure Pressure Pressure Hemostasis Achieved: Insensate Insensate Insensate Procedural Pain: Insensate Insensate Insensate Post Procedural Pain: Procedure was tolerated well  Procedure was tolerated well Procedure was tolerated well Debridement Treatment Response: 1.2x4.4x0.1 0.9x3x0.1 1.8x0.6x0.1 Post Debridement Measurements L x W x D (cm) 0.415 0.212 0.085 Post Debridement Volume: (cm) [name] [name] [name] Post Debridement Stage: No Abnormalities Noted No Abnormalities Noted Scarring: Yes Periwound Skin Texture: Maceration: No Maceration: No Dry/Scaly: Yes Periwound Skin Moisture: Dry/Scaly: No Dry/Scaly: No Rubor: Yes Rubor: Yes Erythema: No Periwound Skin Color: Erythema: No No Abnormality No Abnormality No Abnormality Temperature: Debridement Debridement Debridement Procedures Performed: Wound Number: [id number] Photos: Left, Posterior Upper Leg Right, Anterior Ankle Left, Plantar Foot Wound Location: Pressure Injury Gradually Appeared Gradually Appeared Wounding Event: Pressure Ulcer Venous Leg Ulcer Lymphedema Primary Etiology: Sleep Apnea, Hypertension, Paraplegia Sleep Apnea, Hypertension, Paraplegia Sleep Apnea, Hypertension, Paraplegia Comorbid History: 01/10/2023 07/04/2023 07/04/2023 Date Acquired: 31 6 6  Weeks of Treatment: Open Open Open Wound Status: No No No Wound Recurrence: Yes No No Clustered Wound: 4 [name] [name] Clustered Quantity: 7x4.4x0.1 1x0.3x0.1 0.3x0.2x0.1 Measurements L x W x D (cm) 24.19 0.236 0.047 [name] (cm) : rea 2.419 0.024 0.005 Volume (cm) : 67.60% 86.90% 96.70% % Reduction in [name] rea: 67.60% 86.70% 96.50% % Reduction in Volume: Category/Stage III Full Thickness Without Exposed Full Thickness Without Exposed Classification: Support Structures Support Structures Medium None Present Small Exudate [name] mount: Purulent [name] Serous Exudate Type: yellow, brown, green [name] amber Exudate Color: Flat and Intact Flat and Intact Distinct, outline attached Wound Margin: Large (67-100%) None Present (0%) Large (67-100%) Granulation [name] mount: Red, Friable [name] Red Granulation Quality: None Present (0%) Small (1-33%) None  Present (0%) Necrotic [name] mount: [name] Eschar [name] Necrotic Tissue: Fat Layer (Subcutaneous Tissue): Yes Fat Layer (Subcutaneous Tissue): Yes Fat Layer (Subcutaneous Tissue): Yes Exposed Structures: Fascia: No Fascia: No Fascia: No Tendon: No Tendon: No Tendon: No Muscle: No Muscle: No Muscle: No Joint: No Joint: No Joint: No Bone: No Bone: No Bone: No Small (1-33%) Large (67-100%) Large (67-100%) Epithelialization: Debridement - Selective/Open Wound [name] [name] Debridement: Pre-procedure Verification/Time Out 09:00 [name] [name] Taken: Slough [name] [name] Tissue Debrided: Non-Viable Tissue [name] [name] Level: 9.67 [name] [name] Debridement [name] (sq cm): rea Curette [name] [name] Instrument: Minimum [name] [name] Bleeding: Pressure [name] [name] Hemostasis [name] chieved: Insensate [name] [name] Procedural Pain: Insensate [name] [name] Post Procedural Pain: Procedure was tolerated well [name] [name] Debridement Treatment Response: 7x4.4x0.1 [name] [name] Post Debridement Measurements L x W x D (cm) 2.419 [name] [name] Post Debridement Volume: (cm) Category/Stage III [name] [name] Post Debridement Stage: Scarring: Yes No Abnormalities Noted No Abnormalities  Noted Periwound Skin Texture: Dry/Scaly: Yes No Abnormalities Noted Maceration: Yes Periwound Skin Moisture: Ecchymosis: Yes No Abnormalities Noted No Abnormalities Noted Periwound Skin Color: No Abnormality No Abnormality No Abnormality Temperature: Debridement [name] [name] Procedures Performed: Polito, [name] ([id number]) [id number] Page 4 of 18 Wound Number: 80 [name] [name] Photos: [name] [name] Right, Plantar Foot [name] [name] Wound Location: Gradually Appeared [name] [name] Wounding Event: Lymphedema [name] [name] Primary Etiology: Sleep Apnea, Hypertension, Paraplegia [name] [name] Comorbid History: 07/04/2023 [name] [name] Date Acquired: 6 [name] [name] Weeks of Treatment: Healed - Epithelialized [name] [name] Wound Status: No [name] [name] Wound Recurrence: No [name] [name] Clustered Wound: [name] [name]  [name] Clustered Quantity: 0x0x0 [name] [name] Measurements L x W x D (cm) 0 [name] [name] [name] (cm) : rea 0 [name] [name] Volume (cm) : 100.00% [name] [name] % Reduction in [name] rea: 100.00% [name] [name] % Reduction in Volume: Full Thickness Without Exposed [name] [name] Classification: Support Structures None Present [name] [name] Exudate [name] mount: [name] [name] [name] Exudate Type: [name] [name] [name] Exudate Color: [name] [name] [name] Wound Margin: None Present (0%) [name] [name] Granulation [name] mount: [name] [name] [name] Granulation Quality: None Present (0%) [name] [name] Necrotic [name] mount: [name] [name] [name] Necrotic Tissue: Fascia: No [name] [name] Exposed Structures: Fat Layer (Subcutaneous Tissue): No Tendon: No Muscle: No Joint: No Bone: No Large (67-100%) [name] [name] Epithelialization: [name] [name] [name] Debridement: [name] [name] [name] Tissue Debrided: [name] [name] [name] Level: [name] [name] [name] Debridement [name] (sq cm): rea [name] [name] [name] Instrument: [name] [name] [name] Bleeding: [name] [name] [name] Hemostasis [name] chieved: [name] [name] [name] Procedural Pain: [name] [name] [name] Post Procedural Pain: Debridement Treatment Response: [name] [name] [name] Post Debridement Measurements L x [name] [name] [name] W x D (cm) [name] [name] [name] Post Debridement Volume: (cm) [name] [name] [name] Post Debridement Stage: No Abnormalities Noted [name] [name] Periwound Skin Texture: Maceration: No [name] [name] Periwound Skin Moisture: Dry/Scaly: No No Abnormalities Noted [name] [name] Periwound Skin Color: No Abnormality [name] [name] Temperature: [name] [name] [name] Procedures Performed: Treatment Notes Electronic Signature(s) Signed: 08/23/2023 9:25:18 AM By: Duanne Guess MD FACS Entered By: Duanne Guess on 08/23/2023 06:25:17 -------------------------------------------------------------------------------- Multi-Disciplinary Care Plan Details Patient Name: Date of Service: [name] [name] 08/23/2023 8:15 [name] Hodges, Kaito E ([id number]) [id number] Page 5 of 18 Medical Record Number: [id number] Patient Account Number: [id number] Date of  Birth/Sex: Treating RN: 23-Mar-1986 (37 y.o. Damaris Schooner Primary Care Solomon Skowronek: [name], [name] Other Clinician: Referring Najeeb Uptain: Treating Vencent Hauschild/Extender: Fredderick Severance [name]: 398 Multidisciplinary Care Plan reviewed with physician Active Inactive Pressure Nursing Diagnoses: Knowledge deficit related to management of pressures ulcers Potential for impaired tissue integrity related to pressure, friction, moisture, and shear Goals: Patient will remain free from development of additional pressure ulcers Date Initiated: 06/21/2018 Date Inactivated: 02/12/2019 Target Resolution Date: 02/16/2019 Unmet Reason: pressure ulcer Goal Status: Unmet reopened on left ischium Patient/caregiver will verbalize risk factors for pressure ulcer development Date Initiated: 06/21/2018 Date Inactivated: 05/28/2019 Target Resolution Date: 06/01/2019 Goal Status: Met Patient/caregiver will verbalize understanding of pressure ulcer management Date Initiated: 06/01/2022 Target Resolution Date: 09/23/2023 Goal Status: Active Interventions: Assess: immobility, friction, shearing, incontinence upon admission and as needed Assess offloading mechanisms upon admission and as needed Assess potential for pressure ulcer upon admission and as needed Treatment Activities: Pressure reduction/relief device ordered : 12/05/2017 Notes: Venous Leg Ulcer Nursing Diagnoses: Knowledge deficit related to disease process and management Potential for venous Insuffiency (use before diagnosis confirmed) Goals: Non-invasive venous studies are completed as  ordered Date Initiated: 01/05/2016 Date Inactivated: 09/15/2017 Target Resolution Date: 01/20/2017 Goal Status: Met Patient will maintain optimal edema control Date Initiated: 01/05/2016 Target Resolution Date: 09/23/2023 Goal Status: Active Patient/caregiver will verbalize understanding of disease process and disease management Date  Initiated: 01/05/2016 Date Inactivated: 09/15/2017 Target Resolution Date: 01/20/2017 Goal Status: Met Verify adequate tissue perfusion prior to therapeutic compression application Date Initiated: 01/05/2016 Date Inactivated: 09/15/2017 Target Resolution Date: 01/20/2017 Goal Status: Met Interventions: Assess peripheral edema status every visit. Compression as ordered Provide education on venous insufficiency Treatment Activities: Non-invasive vascular studies : 03/11/2016 Therapeutic compression applied : 03/11/2016 Notes: Wound/Skin Impairment Nursing Diagnoses: Impaired tissue integrity Knowledge deficit related to ulceration/compromised skin integrity Hegel, Wanya E ([id number]) [id number] Page 6 of 18 Goals: Patient/caregiver will verbalize understanding of skin care regimen Date Initiated: 06/01/2022 Target Resolution Date: 09/30/2023 Goal Status: Active Ulcer/skin breakdown will have [name] volume reduction of 30% by week 4 Date Initiated: 06/01/2022 Date Inactivated: 06/29/2022 Target Resolution Date: 06/29/2022 Unmet Reason: complex wounds, Goal Status: Unmet infection Interventions: Assess patient/caregiver ability to obtain necessary supplies Assess ulceration(s) every visit Provide education on ulcer and skin care Notes: 02/02/21: Complex Care, ongoing. Electronic Signature(s) Signed: 08/24/2023 5:18:43 PM By: Zenaida Deed RN, BSN Entered By: Zenaida Deed on 08/23/2023 06:08:05 -------------------------------------------------------------------------------- Pain Assessment Details Patient Name: Date of Service: [name] [name] 08/23/2023 8:15 [name] Medical Record Number: [id number] Patient Account Number: [id number] Date of Birth/Sex: Treating RN: Apr 08, 1986 (37 y.o. Damaris Schooner Primary Care Trula Frede: [name], [name] Other Clinician: Referring Ardyce Heyer: Treating Spirit Wernli/Extender: Fredderick Severance [name]: 398 Active  Problems Location of Pain Severity and Description of Pain Patient Has Paino No Site Locations Rate the pain. Current Pain Level: 0 Pain Management and Medication Current Pain Management: Electronic Signature(s) Signed: 08/24/2023 5:18:43 PM By: Zenaida Deed RN, BSN Entered By: Zenaida Deed on 08/23/2023 05:32:29 Jutte, [name] ([id number]) [id number] Page 7 of 18 -------------------------------------------------------------------------------- Patient/Caregiver Education Details Patient Name: Date of Service: Thivierge, [name] 10/1/2024andnbsp8:15 [name] Medical Record Number: [id number] Patient Account Number: [id number] Date of Birth/Gender: Treating RN: Nov 30, 1985 (37 y.o. Damaris Schooner Primary Care Physician: Eunice Blase Other Clinician: Referring Physician: Treating Physician/Extender: Fredderick Severance [name]: 398 Education Assessment Education Provided To: Patient Education Topics Provided Pressure: Methods: Explain/Verbal Responses: Reinforcements needed, State content correctly Venous: Methods: Explain/Verbal Responses: Reinforcements needed, State content correctly Wound/Skin Impairment: Methods: Explain/Verbal Responses: Reinforcements needed, State content correctly Electronic Signature(s) Signed: 08/24/2023 5:18:43 PM By: Zenaida Deed RN, BSN Entered By: Zenaida Deed on 08/23/2023 06:08:53 -------------------------------------------------------------------------------- Wound Assessment Details Patient Name: Date of Service: Brandenburger, [name] 08/23/2023 8:15 [name] Medical Record Number: [id number] Patient Account Number: [id number] Date of Birth/Sex: Treating RN: September 26, 1986 (36 y.o. Damaris Schooner Primary Care Tyishia Aune: [name], [name] Other Clinician: Referring Dare Sanger: Treating Arthea Nobel/Extender: Benjamine Mola, [name]: 398 Wound Status Wound Number: 52 Primary  Etiology: Venous Leg Ulcer Wound Location: Right, Dorsal Foot Wound Status: Open Wounding Event: Gradually Appeared Comorbid History: Sleep Apnea, Hypertension, Paraplegia Date Acquired: 03/27/2021 Weeks Of Treatment: 125 Clustered Wound: No Photos Locker, Advaith E ([id number]) [id number] Page 8 of 18 Wound Measurements Length: (cm) 1.2 Width: (cm) 4.4 Depth: (cm) 0.1 Area: (cm) 4.147 Volume: (cm) 0.415 % Reduction in Area: -120% % Reduction in Volume: -120.7% Epithelialization: Medium (34-66%) Tunneling: No Undermining: No Wound Description Classification: Full Thickness Without Exposed Support Structures Wound Margin: Distinct, outline attached Exudate Amount: Medium Exudate Type: Serous Exudate Color:  amber Foul Odor After Cleansing: No Slough/Fibrino Yes Wound Bed Granulation Amount: Small (1-33%) Exposed Structure Granulation Quality: Red Fascia Exposed: No Necrotic Amount: Medium (34-66%) Fat Layer (Subcutaneous Tissue) Exposed: Yes Necrotic Quality: Adherent Slough Tendon Exposed: No Muscle Exposed: No Joint Exposed: No Bone Exposed: No Periwound Skin Texture Texture Color No Abnormalities Noted: Yes No Abnormalities Noted: No Rubor: Yes Moisture No Abnormalities Noted: No Temperature / Pain Dry / Scaly: No Temperature: No Abnormality Maceration: No Treatment Notes Wound #52 (Foot) Wound Laterality: Dorsal, Right Cleanser Soap and Water Discharge Instruction: May shower and wash wound with dial antibacterial soap and water prior to dressing change. Wound Cleanser Discharge Instruction: Cleanse the wound with wound cleanser prior to applying [name] clean dressing using gauze sponges, not tissue or cotton balls. Peri-Wound Care Sween Lotion (Moisturizing lotion) Discharge Instruction: Apply Aquaphor moisturizing lotion as directed Topical Mupirocin Ointment Discharge Instruction: Apply Mupirocin (Bactroban) as instructed Primary  Dressing Maxorb Extra Ag+ Alginate Dressing, 2x2 (in/in) Discharge Instruction: Apply to wound bed as instructed Secondary Dressing Woven Gauze Sponge, Non-Sterile 4x4 in Discharge Instruction: Apply over primary dressing as directed. Secured With [employer], 4.5x3.1 (in/yd) Discharge Instruction: Secure with Kerlix as directed. Dorsch, RESEAN BRANDER ([id number]) [id number] Page 9 of 18 13M Medipore H Soft Cloth Surgical T ape, 4 x 10 (in/yd) Discharge Instruction: Secure with tape as directed. Compression Wrap Compression Stockings Add-Ons Electronic Signature(s) Signed: 08/24/2023 5:18:43 PM By: Zenaida Deed RN, BSN Entered By: Zenaida Deed on 08/23/2023 05:49:55 -------------------------------------------------------------------------------- Wound Assessment Details Patient Name: Date of Service: [name] [name] 08/23/2023 8:15 [name] Medical Record Number: [id number] Patient Account Number: [id number] Date of Birth/Sex: Treating RN: 04/01/1986 (37 y.o. Damaris Schooner Primary Care Verania Salberg: [name], [name] Other Clinician: Referring Khushi Zupko: Treating Italia Wolfert/Extender: Benjamine Mola, [name]: 398 Wound Status Wound Number: 56 Primary Etiology: Neuropathic Ulcer-Non Diabetic Wound Location: Left, Dorsal Foot Wound Status: Open Wounding Event: Gradually Appeared Comorbid History: Sleep Apnea, Hypertension, Paraplegia Date Acquired: 07/11/2021 Weeks Of Treatment: 109 Clustered Wound: Yes Photos Wound Measurements Length: (cm) 0.9 Width: (cm) 3 Depth: (cm) 0.1 Area: (cm) 2.121 Volume: (cm) 0.212 % Reduction in Area: 65.9% % Reduction in Volume: 65.9% Epithelialization: Small (1-33%) Tunneling: No Undermining: No Wound Description Classification: Full Thickness Without Exposed Suppor Wound Margin: Flat and Intact Exudate Amount: Medium Exudate Type: Serous Exudate Color: amber t Structures Foul Odor After  Cleansing: No Slough/Fibrino Yes Wound Bed Granulation Amount: Large (67-100%) Exposed Structure Granulation Quality: Red Fascia Exposed: No Necrotic Amount: Small (1-33%) Fat Layer (Subcutaneous Tissue) Exposed: Yes Necrotic Quality: Adherent Slough Tendon Exposed: No Muscle Exposed: No Joint Exposed: No Bone Exposed: No Mcaffee, Lenzie E ([id number]) [id number] Page 10 of 18 Periwound Skin Texture Texture Color No Abnormalities Noted: Yes No Abnormalities Noted: No Erythema: No Moisture Rubor: Yes No Abnormalities Noted: No Dry / Scaly: No Temperature / Pain Maceration: No Temperature: No Abnormality Treatment Notes Wound #56 (Foot) Wound Laterality: Dorsal, Left Cleanser Soap and Water Discharge Instruction: May shower and wash wound with dial antibacterial soap and water prior to dressing change. Wound Cleanser Discharge Instruction: Cleanse the wound with wound cleanser prior to applying [name] clean dressing using gauze sponges, not tissue or cotton balls. Peri-Wound Care Sween Lotion (Moisturizing lotion) Discharge Instruction: Apply Aquaphor moisturizing lotion as directed Topical Mupirocin Ointment Discharge Instruction: Apply Mupirocin (Bactroban) as instructed Primary Dressing Maxorb Extra Ag+ Alginate Dressing, 2x2 (in/in) Discharge Instruction: Apply to wound bed as instructed Secondary Dressing Woven Gauze Sponge,  Non-Sterile 4x4 in Discharge Instruction: Apply over primary dressing as directed. Secured With [employer], 4.5x3.1 (in/yd) Discharge Instruction: Secure with Kerlix as directed. 363M Medipore H Soft Cloth Surgical T ape, 4 x 10 (in/yd) Discharge Instruction: Secure with tape as directed. Compression Wrap Compression Stockings Add-Ons Electronic Signature(s) Signed: 08/24/2023 5:18:43 PM By: Zenaida Deed RN, BSN Entered By: Zenaida Deed on 08/23/2023  05:53:22 -------------------------------------------------------------------------------- Wound Assessment Details Patient Name: Date of Service: [name] [name] 08/23/2023 8:15 [name] Medical Record Number: [id number] Patient Account Number: [id number] Date of Birth/Sex: Treating RN: May 02, 1986 (37 y.o. Damaris Schooner Primary Care Nova Schmuhl: [name], [name] Other Clinician: Referring Lataja Newland: Treating Kacelyn Rowzee/Extender: Benjamine Mola, [name]: 398 Wound Status Wound Number: 67 Primary Etiology: Venous Leg Ulcer Wound Location: Left, Lateral Lower Leg Wound Status: Open Wounding Event: Gradually Appeared Comorbid History: Sleep Apnea, Hypertension, Paraplegia Date Acquired: 06/22/2022 Weeks Of Treatment: 60 Ballow, Joneric E ([id number]) [id number] Page 11 of 18 Clustered Wound: No Photos Wound Measurements Length: (cm) 1.8 Width: (cm) 0.6 Depth: (cm) 0.1 Area: (cm) 0.848 Volume: (cm) 0.085 % Reduction in Area: 84.9% % Reduction in Volume: 84.8% Epithelialization: Small (1-33%) Tunneling: No Undermining: No Wound Description Classification: Full Thickness Without Exposed Support Structures Wound Margin: Distinct, outline attached Exudate Amount: Medium Exudate Type: Serous Exudate Color: amber Foul Odor After Cleansing: No Slough/Fibrino No Wound Bed Granulation Amount: Small (1-33%) Exposed Structure Granulation Quality: Red, Pink Fascia Exposed: No Necrotic Amount: Large (67-100%) Fat Layer (Subcutaneous Tissue) Exposed: Yes Necrotic Quality: Adherent Slough Tendon Exposed: No Muscle Exposed: No Joint Exposed: No Bone Exposed: No Periwound Skin Texture Texture Color No Abnormalities Noted: No No Abnormalities Noted: Yes Scarring: Yes Temperature / Pain Temperature: No Abnormality Moisture No Abnormalities Noted: No Dry / Scaly: Yes Treatment Notes Wound #67 (Lower Leg) Wound Laterality: Left,  Lateral Cleanser Soap and Water Discharge Instruction: May shower and wash wound with dial antibacterial soap and water prior to dressing change. Wound Cleanser Discharge Instruction: Cleanse the wound with wound cleanser prior to applying [name] clean dressing using gauze sponges, not tissue or cotton balls. Peri-Wound Care Sween Lotion (Moisturizing lotion) Discharge Instruction: Apply Aquaphor moisturizing lotion as directed Topical Mupirocin Ointment Discharge Instruction: Apply Mupirocin (Bactroban) as instructed Primary Dressing Maxorb Extra Ag+ Alginate Dressing, 2x2 (in/in) Discharge Instruction: Apply to wound bed as instructed Secondary Dressing Woven Gauze Sponge, Non-Sterile 4x4 in Discharge Instruction: Apply over primary dressing as directed. Vancott, LEMONT SITZMANN ([id number]) [id number] Page 12 of 18 Secured With [employer], 4.5x3.1 (in/yd) Discharge Instruction: Secure with Kerlix as directed. 363M Medipore H Soft Cloth Surgical T ape, 4 x 10 (in/yd) Discharge Instruction: Secure with tape as directed. Compression Wrap Compression Stockings Add-Ons Electronic Signature(s) Signed: 08/24/2023 5:18:43 PM By: Zenaida Deed RN, BSN Entered By: Zenaida Deed on 08/23/2023 05:55:03 -------------------------------------------------------------------------------- Wound Assessment Details Patient Name: Date of Service: [name] [name] 08/23/2023 8:15 [name] Medical Record Number: [id number] Patient Account Number: [id number] Date of Birth/Sex: Treating RN: 22-Oct-1986 (37 y.o. Damaris Schooner Primary Care Shameika Speelman: [name], [name] Other Clinician: Referring Hasset Chaviano: Treating Shawndell Varas/Extender: Benjamine Mola, [name]: 398 Wound Status Wound Number: 74 Primary Etiology: Pressure Ulcer Wound Location: Left, Posterior Upper Leg Wound Status: Open Wounding Event: Pressure Injury Comorbid History: Sleep Apnea, Hypertension,  Paraplegia Date Acquired: 01/10/2023 Weeks Of Treatment: 31 Clustered Wound: Yes Photos Wound Measurements Length: (cm) Width: (cm) Depth: (cm) Clustered Quantity: Area: (cm) Volume: (cm) 7 % Reduction in Area: 67.6%  4.4 % Reduction in Volume: 67.6% 0.1 Epithelialization: Small (1-33%) 4 Tunneling: No 24.19 Undermining: No 2.419 Wound Description Classification: Category/Stage III Wound Margin: Flat and Intact Exudate Amount: Medium Exudate Type: Purulent Exudate Color: yellow, brown, green Foul Odor After Cleansing: No Slough/Fibrino No Wound Bed Granulation Amount: Large (67-100%) Exposed Structure Granulation Quality: Red, Friable Fascia Exposed: No Necrotic Amount: None Present (0%) Fat Layer (Subcutaneous Tissue) Exposed: Yes Ozga, Dacen E ([id number]) [id number] Page 13 of 18 Tendon Exposed: No Muscle Exposed: No Joint Exposed: No Bone Exposed: No Periwound Skin Texture Texture Color No Abnormalities Noted: No No Abnormalities Noted: Yes Scarring: Yes Temperature / Pain Temperature: No Abnormality Moisture No Abnormalities Noted: No Dry / Scaly: Yes Treatment Notes Wound #74 (Upper Leg) Wound Laterality: Left, Posterior Cleanser Vashe 5.8 (oz) Discharge Instruction: Cleanse the wound with Vashe prior to applying [name] clean dressing using gauze sponges , let sit on wound for 10 minutes Peri-Wound Care Zinc Oxide Ointment 30g tube Discharge Instruction: Apply Zinc Oxide to periwound with each dressing change as needed fpr moisture Topical Gentamicin Discharge Instruction: in clinic if [facility] not available [facility] antibiotic compound Primary Dressing Maxorb Extra Ag+ Alginate Dressing, 4x4.75 (in/in) Discharge Instruction: Apply to wound bed as instructed Secondary Dressing Zetuvit Plus Silicone Border Sacrum Dressing, Sm, 7x7 (in/in) Discharge Instruction: Apply silicone border over primary dressing as directed. Secured  With 27M Medipore H Soft Cloth Surgical T ape, 4 x 10 (in/yd) Discharge Instruction: Secure with tape as directed. Compression Wrap Compression Stockings Add-Ons Electronic Signature(s) Signed: 08/24/2023 5:18:43 PM By: Zenaida Deed RN, BSN Entered By: Zenaida Deed on 08/23/2023 06:02:03 -------------------------------------------------------------------------------- Wound Assessment Details Patient Name: Date of Service: [name] [name] 08/23/2023 8:15 [name] Medical Record Number: [id number] Patient Account Number: [id number] Date of Birth/Sex: Treating RN: 1986-01-01 (37 y.o. Damaris Schooner Primary Care Ilan Kahrs: [name], [name] Other Clinician: Referring Ariely Riddell: Treating Silvanna [name]/Extender: Benjamine Mola, [name]: 398 Wound Status Wound Number: 78 Primary Etiology: Venous Leg Ulcer Wound Location: Right, Anterior Ankle Wound Status: Open Wounding Event: Gradually Appeared Comorbid History: Sleep Apnea, Hypertension, Paraplegia Date Acquired: 07/04/2023 Weeks Of Treatment: 6 Edwin, Muhamed E ([id number]) [id number] Page 14 of 18 Clustered Wound: No Photos Wound Measurements Length: (cm) 1 Width: (cm) 0.3 Depth: (cm) 0.1 Area: (cm) 0.236 Volume: (cm) 0.024 % Reduction in Area: 86.9% % Reduction in Volume: 86.7% Epithelialization: Large (67-100%) Tunneling: No Undermining: No Wound Description Classification: Full Thickness Without Exposed Support Structures Wound Margin: Flat and Intact Exudate Amount: None Present Foul Odor After Cleansing: No Slough/Fibrino Yes Wound Bed Granulation Amount: None Present (0%) Exposed Structure Necrotic Amount: Small (1-33%) Fascia Exposed: No Necrotic Quality: Eschar Fat Layer (Subcutaneous Tissue) Exposed: Yes Tendon Exposed: No Muscle Exposed: No Joint Exposed: No Bone Exposed: No Periwound Skin Texture Texture Color No Abnormalities Noted: Yes No Abnormalities  Noted: Yes Moisture Temperature / Pain No Abnormalities Noted: Yes Temperature: No Abnormality Treatment Notes Wound #78 (Ankle) Wound Laterality: Right, Anterior Cleanser Soap and Water Discharge Instruction: May shower and wash wound with dial antibacterial soap and water prior to dressing change. Wound Cleanser Discharge Instruction: Cleanse the wound with wound cleanser prior to applying [name] clean dressing using gauze sponges, not tissue or cotton balls. Peri-Wound Care Sween Lotion (Moisturizing lotion) Discharge Instruction: Apply Aquaphor moisturizing lotion as directed Topical Mupirocin Ointment Discharge Instruction: Apply Mupirocin (Bactroban) as instructed Primary Dressing Maxorb Extra Ag+ Alginate Dressing, 2x2 (in/in) Discharge Instruction: Apply to wound bed as instructed Secondary Dressing Woven  Gauze Sponge, Non-Sterile 4x4 in Discharge Instruction: Apply over primary dressing as directed. Secured With [employer], 4.5x3.1 (in/yd) Discharge Instruction: Secure with Kerlix as directed. Burdick, YOVAN LEEMAN ([id number]) [id number] Page 15 of 18 84M Medipore H Soft Cloth Surgical T ape, 4 x 10 (in/yd) Discharge Instruction: Secure with tape as directed. Compression Wrap Compression Stockings Add-Ons Electronic Signature(s) Signed: 08/24/2023 5:18:43 PM By: Zenaida Deed RN, BSN Entered By: Zenaida Deed on 08/23/2023 05:56:52 -------------------------------------------------------------------------------- Wound Assessment Details Patient Name: Date of Service: [name] [name] 08/23/2023 8:15 [name] Medical Record Number: [id number] Patient Account Number: [id number] Date of Birth/Sex: Treating RN: [birth year] (37 y.o. Damaris Schooner Primary Care Mindel Friscia: [name], [name] Other Clinician: Referring Kadeem Hyle: Treating Mersadies Petree/Extender: Benjamine Mola, [name]: 398 Wound Status Wound Number: 79 Primary  Etiology: Lymphedema Wound Location: Left, Plantar Foot Wound Status: Open Wounding Event: Gradually Appeared Comorbid History: Sleep Apnea, Hypertension, Paraplegia Date Acquired: 07/04/2023 Weeks Of Treatment: 6 Clustered Wound: No Photos Wound Measurements Length: (cm) 0.3 Width: (cm) 0.2 Depth: (cm) 0.1 Area: (cm) 0.047 Volume: (cm) 0.005 % Reduction in Area: 96.7% % Reduction in Volume: 96.5% Epithelialization: Large (67-100%) Tunneling: No Undermining: No Wound Description Classification: Full Thickness Without Exposed Suppor Wound Margin: Distinct, outline attached Exudate Amount: Small Exudate Type: Serous Exudate Color: amber t Structures Foul Odor After Cleansing: No Slough/Fibrino Yes Wound Bed Granulation Amount: Large (67-100%) Exposed Structure Granulation Quality: Red Fascia Exposed: No Necrotic Amount: None Present (0%) Fat Layer (Subcutaneous Tissue) Exposed: Yes Tendon Exposed: No Muscle Exposed: No Joint Exposed: No Bone Exposed: No Kraker, Blakeley E ([id number]) [id number].pdf Page 16 of 18 Periwound Skin Texture Texture Color No Abnormalities Noted: Yes No Abnormalities Noted: Yes Moisture Temperature / Pain No Abnormalities Noted: Yes Temperature: No Abnormality Treatment Notes Wound #79 (Foot) Wound Laterality: Plantar, Left Cleanser Soap and Water Discharge Instruction: May shower and wash wound with dial antibacterial soap and water prior to dressing change. Wound Cleanser Discharge Instruction: Cleanse the wound with wound cleanser prior to applying [name] clean dressing using gauze sponges, not tissue or cotton balls. Peri-Wound Care Sween Lotion (Moisturizing lotion) Discharge Instruction: Apply Aquaphor moisturizing lotion as directed Topical Mupirocin Ointment Discharge Instruction: Apply Mupirocin (Bactroban) as instructed Primary Dressing Maxorb Extra Ag+ Alginate Dressing, 2x2 (in/in) Discharge Instruction:  Apply to wound bed as instructed Secondary Dressing Woven Gauze Sponge, Non-Sterile 4x4 in Discharge Instruction: Apply over primary dressing as directed. Secured With [employer], 4.5x3.1 (in/yd) Discharge Instruction: Secure with Kerlix as directed. 84M Medipore H Soft Cloth Surgical T ape, 4 x 10 (in/yd) Discharge Instruction: Secure with tape as directed. Compression Wrap Compression Stockings Add-Ons Electronic Signature(s) Signed: 08/24/2023 5:18:43 PM By: Zenaida Deed RN, BSN Entered By: Zenaida Deed on 08/23/2023 05:58:19 -------------------------------------------------------------------------------- Wound Assessment Details Patient Name: Date of Service: [name] [name] 08/23/2023 8:15 [name] Medical Record Number: [id number] Patient Account Number: [id number] Date of Birth/Sex: Treating RN: 02/25/1986 (37 y.o. Damaris Schooner Primary Care Daron Stutz: [name], [name] Other Clinician: Referring Daylin Eads: Treating Osby Sweetin/Extender: Benjamine Mola, [name]: 398 Wound Status Wound Number: 80 Primary Etiology: Lymphedema Wound Location: Right, Plantar Foot Wound Status: Healed - Epithelialized Wounding Event: Gradually Appeared Comorbid History: Sleep Apnea, Hypertension, Paraplegia Date Acquired: 07/04/2023 Weeks Of Treatment: 6 Clustered Wound: No Touchet, Skylen E ([id number]) [id number] Page 17 of 18 Photos Wound Measurements Length: (cm) Width: (cm) Depth: (cm) Area: (cm) Volume: (cm) 0 % Reduction in Area: 100% 0 % Reduction in Volume:  100% 0 Epithelialization: Large (67-100%) 0 Tunneling: No 0 Undermining: No Wound Description Classification: Full Thickness Without Exposed Suppor Exudate Amount: None Present t Structures Foul Odor After Cleansing: No Slough/Fibrino No Wound Bed Granulation Amount: None Present (0%) Exposed Structure Necrotic Amount: None Present (0%) Fascia Exposed: No Fat  Layer (Subcutaneous Tissue) Exposed: No Tendon Exposed: No Muscle Exposed: No Joint Exposed: No Bone Exposed: No Periwound Skin Texture Texture Color No Abnormalities Noted: Yes No Abnormalities Noted: Yes Moisture Temperature / Pain No Abnormalities Noted: No Temperature: No Abnormality Dry / Scaly: No Maceration: No Electronic Signature(s) Signed: 08/24/2023 5:18:43 PM By: Zenaida Deed RN, BSN Entered By: Zenaida Deed on 08/23/2023 05:59:42 -------------------------------------------------------------------------------- Vitals Details Patient Name: Date of Service: Tietze, [name] 08/23/2023 8:15 [name] Medical Record Number: [id number] Patient Account Number: [id number] Date of Birth/Sex: Treating RN: Oct 09, 1986 (37 y.o. Damaris Schooner Primary Care Radin Raptis: [name], [name] Other Clinician: Referring Dayle Mcnerney: Treating Yerik Zeringue/Extender: Benjamine Mola, [name]: 398 Vital Signs Time Taken: 08:30 Temperature (F): 97.8 Height (in): 70 Pulse (bpm): 112 Weight (lbs): 216 Respiratory Rate (breaths/min): 18 Body Mass Index (BMI): 31 Blood Pressure (mmHg): 128/88 Reference Range: 80 - 120 mg / dl Bohman, Cartrell E ([id number]) [id number].pdf Page 18 of 18 Electronic Signature(s) Signed: 08/24/2023 5:18:43 PM By: Zenaida Deed RN, BSN Entered By: Zenaida Deed on 08/23/2023 05:32:20

## 2023-09-13 ENCOUNTER — Ambulatory Visit (HOSPITAL_BASED_OUTPATIENT_CLINIC_OR_DEPARTMENT_OTHER): Payer: BC Managed Care – PPO | Admitting: General Surgery

## 2023-09-26 ENCOUNTER — Encounter (HOSPITAL_BASED_OUTPATIENT_CLINIC_OR_DEPARTMENT_OTHER): Payer: BC Managed Care – PPO | Attending: General Surgery | Admitting: General Surgery

## 2023-09-26 DIAGNOSIS — L97528 Non-pressure chronic ulcer of other part of left foot with other specified severity: Secondary | ICD-10-CM | POA: Diagnosis not present

## 2023-09-26 DIAGNOSIS — I890 Lymphedema, not elsewhere classified: Secondary | ICD-10-CM | POA: Insufficient documentation

## 2023-09-26 DIAGNOSIS — L97522 Non-pressure chronic ulcer of other part of left foot with fat layer exposed: Secondary | ICD-10-CM | POA: Diagnosis not present

## 2023-09-26 DIAGNOSIS — I872 Venous insufficiency (chronic) (peripheral): Secondary | ICD-10-CM | POA: Diagnosis not present

## 2023-09-26 DIAGNOSIS — L97512 Non-pressure chronic ulcer of other part of right foot with fat layer exposed: Secondary | ICD-10-CM | POA: Diagnosis not present

## 2023-09-26 DIAGNOSIS — L89323 Pressure ulcer of left buttock, stage 3: Secondary | ICD-10-CM | POA: Diagnosis not present

## 2023-09-26 DIAGNOSIS — G8221 Paraplegia, complete: Secondary | ICD-10-CM | POA: Insufficient documentation

## 2023-09-26 DIAGNOSIS — L97322 Non-pressure chronic ulcer of left ankle with fat layer exposed: Secondary | ICD-10-CM | POA: Insufficient documentation

## 2023-09-26 DIAGNOSIS — G6289 Other specified polyneuropathies: Secondary | ICD-10-CM | POA: Diagnosis not present

## 2023-09-26 NOTE — Progress Notes (Signed)
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 surface slough as reported by her intake nurse. Small wound at the base of the left second toe 12/02/16; patient's right leg is better and there is no open wound here. His left anterior lateral leg wound continues to have a healthy-looking surface. Small wound at the base of the left second toe however there is erythema in the left forefoot which is worrisome 12/16/16; is no open wounds on his right leg. We took measurements for stockings. His left anterior lateral leg wound continues to have a healthy-looking surface. I'm not sure where we were with the Apligraf run through his insurance. We have been using Iodoflex. He has a thick eschar on the left first second toe interface, I suspect this may be fungal however there is no visible open 12/23/16; no open wound on his right leg. He has 2 small areas left of the linear wound that was remaining last week. We have been using Prisma, I thought  I have disclosed this week, we can only look forward to next week 01/03/17; the patient had concerning areas of erythema last week, already on doxycycline for UTI through his primary doctor. The erythema is absolutely no better there is warmth and swelling both medially from the left lateral leg wound and also the dorsal left foot. 01/06/17- Patient is here for follow-up evaluation of his left lateral leg ulcer and bilateral feet ulcers. He is on oral antibiotic therapy, tolerating that. Nursing staff and the patient states that the erythema is improved from Monday. 01/13/17; the predominant left lateral leg wound continues to be problematic. I had put Apligraf on him earlier this month once. However he subsequently developed what appeared to be an intense cellulitis around the left lateral leg wound. I gave him Dalvance I think on 2/12 perhaps 2/13 he continues on cefdinir. The erythema is still present but the warmth and swelling is improved. I am hopeful that the cellulitis part of this control. I wouldn't be surprised if there is an element of venous inflammation as well. 01/17/17. The erythema is present but better in the left leg. His left lateral leg wound still does not have a viable surface buttons certain parts of this long thin wound it appears like there has been improvement in dimensions. 01/20/17; the erythema still present but much better in the left leg. I'm thinking this is his usual degree of chronic venous inflammation. The wound on the left leg looks somewhat better. Is less surface slough 01/27/17; erythema is back to the chronic venous inflammation. The wound on the left leg is somewhat better. I am back to the point where I like to try an Apligraf once again 02/10/17; slight improvement in wound dimensions. Apligraf #2. He is completing his doxycycline 02/14/17; patient arrives today having completed doxycycline last Thursday. This was supposed to  be a nurse visit however once again he  hasn't tense erythema from the medial part of his wound extending over the lower leg. Also erythema in his foot this is roughly in the same distribution as last time. He has baseline chronic venous inflammation however this is a lot worse than the baseline I have learned to accept the on him is baseline inflammation 02/24/17- patient is here for follow-up evaluation. He is tolerating compression therapy. His voicing no complaints or concerns he is here anticipating an Apligraf 03/03/17; he arrives today with an adherent necrotic surface. I don't think this is surface is going to be amenable for Apligraf's. The erythema around his wound and on the left dorsal foot has resolved he is off antibiotics 03/10/17; better-looking surface today. I don't think he can tolerate Apligraf's. He tells me he had a wound VAC after a skin graft years ago to this area and they had difficulty with a seal. The erythema continues to be stable around this some degree of chronic venous inflammation but he also has recurrent cellulitis. We have been using Iodoflex 03/17/17; continued improvement in the surface and may be small changes in dimensions. Using Iodoflex which seems the only thing that will control his surface 03/24/17- He is here for follow up evaluation of his LLE lateral ulceration and ulcer to right dorsal foot/toe space. He is voicing no complaints or concerns, He is tolerating compression wrap. 03/31/17 arrives today with a much healthier looking wound on the left lower extremity. We have been using Iodoflex for a prolonged period of time which has Altadonna, Seiya E ([id number]) [id number] Page 7 of 39 for the first time prepared and adequate looking wound bed although we have not had much in the way of wound dimension improvement. He also has a small wound between the first and second toe on the right 04/07/17; arrives today with a healthy-looking wound bed and at least the top 50% of this  wound appears to be now her. No debridement was required I have changed him to [employer] [place] last week after prolonged Iodoflex. He did not do well with Apligraf's. We've had a re-opening between the first and second toe on the right 04/14/17; arrives today with a healthier looking wound bed contractions and the top 50% of this wound and some on the lesser 50%. Wound bed appears healthy. The area between the first and second toe on the right still remains problematic 04/21/17; continued very gradual improvement. Using [facility] 04/28/17; continued very gradual improvement in the left lateral leg venous insufficiency wound. His periwound erythema is very mild. We have been using Hydrofera Blue. Wound is making progress especially in the superior 50% 05/05/17; he continues to have very gradual improvement in the left lateral venous insufficiency wound. Both in terms with an length rings are improving. I debrided this every 2 weeks with #5 curet and we have been using Hydrofera Blue and again making good progress With regards to the wounds between his right first and second toe which I thought might of been tinea pedis he is not making as much progress very dry scaly skin over the area. Also the area at the base of the left first and second toe in a similar condition 05/12/17; continued gradual improvement in the refractory left lateral venous insufficiency wound on the left. Dimension smaller. Surface still requiring debridement using Hydrofera Blue 05/19/17; continued gradual improvement in the refractory left lateral venous ulceration. Careful inspection of the wound bed underlying rumination  suggested some degree of epithelialization over the surface no debridement indicated. Continue Hydrofera Blue difficult areas between his toes first and third on the left than first and second on the right. I'm going to change to silver alginate from silver collagen. Continue ketoconazole as I suspect underlying  tinea pedis 05/26/17; left lateral leg venous insufficiency wound. We've been using Hydrofera Blue. I believe that there is expanding epithelialization over the surface of the wound albeit not coming from the wound circumference. This is a bit of an odd situation in which the epithelialization seems to be coming from the surface of the wound rather than in the exact circumference. There is still small open areas mostly along the lateral margin of the wound. He has unchanged areas between the left first and second and the right first second toes which I been treating for tenia pedis 06/02/17; left lateral leg venous insufficiency wound. We have been using Hydrofera Blue. Somewhat smaller from the wound circumference. The surface of the wound remains a bit on it almost epithelialized sedation in appearance. I use an open curette today debridement in the surface of all of this especially the edges Small open wounds remaining on the dorsal right first and second toe interspace and the plantar left first second toe and her face on the left 06/09/17; wound on the left lateral leg continues to be smaller but very gradual and very dry surface using Hydrofera Blue 06/16/17 requires weekly debridements now on the left lateral leg although this continues to contract. I changed to silver collagen last week because of dryness of the wound bed. Using Iodoflex to the areas on his first and second toes/web space bilaterally 06/24/17; patient with history of paraplegia also chronic venous insufficiency with lymphedema. Has a very difficult wound on the left lateral leg. This has been gradually reducing in terms of with but comes in with a very dry adherent surface. High switch to silver collagen a week or so ago with hydrogel to keep the area moist. This is been refractory to multiple dressing attempts. He also has areas in his first and second toes bilaterally in the anterior and posterior web space. I had been using  Iodoflex here after a prolonged course of silver alginate with ketoconazole was ineffective [question tinea pedis] 07/14/17; patient arrives today with a very difficult adherent material over his left lateral lower leg wound. He also has surrounding erythema and poorly controlled edema. He was switched his Santyl last visit which the nurses are applying once during his doctor visit and once on a nurse visit. He was also reduced to 2 layer compression I'm not exactly sure of the issue here. 07/21/17; better surface today after 1 week of Iodoflex. Significant cellulitis that we treated last week also better. [Doxycycline] 07/28/17 better surface today with now 2 weeks of Iodoflex. Significant cellulitis treated with doxycycline. He has now completed the doxycycline and he is back to his usual degree of chronic venous inflammation/stasis dermatitis. He reminds me he has had ablations surgery here 08/04/17; continued improvement with Iodoflex to the left lateral leg wound in terms of the surface of the wound although the dimensions are better. 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 Problematic areas on the plantar aspect of the first second toe web space on the left and the dorsal aspect of the first second toe web space on the right. At one point I felt these were probably related to  chronic fungal infections in treated him aggressively for this although we have not made any improvement here. 08/11/17; left lateral leg. Surface continues to improve with the Iodoflex although we are not seeing much improvement in overall wound dimensions. Areas on his plantar left foot and right foot show no improvement. In fact the right foot looks somewhat worse 08/18/17; left lateral leg. We changed to [facility] Blue last week after a prolonged course of Iodoflex which helps get the surface better. It appears that the wound with is improved. Continue  with difficult areas on the left dorsal first second and plantar first second on the right 09/01/17; patient arrives in clinic today having had a temperature of 103 yesterday. He was seen in the ER and [facility]. The patient was concerned he could have cellulitis again in the right leg however they diagnosed him with a UTI and he is now on Keflex. He has a history of cellulitis which is been recurrent and difficult but this is been in the left leg, in the past 5 use doxycycline. He does in and out catheterizations at home which are risk factors for UTI 09/08/17; patient will be completing his Keflex this weekend. The erythema on the left leg is considerably better. He has a new wound today on the medial part of the right leg small superficial almost looks like a skin tear. He has worsening of the area on the right dorsal first and second toe. His major area on the left lateral leg is better. Using Hydrofera Blue on all areas 09/15/17; gradual reduction in width on the long wound in the left lateral leg. No debridement required. He also has wounds on the plantar aspect of his left first second toe web space and on the dorsal aspect of the right first second toe web space. 09/22/17; there continues to be very gradual improvements in the dimensions of the left lateral leg wound. He hasn't round erythematous spot with might be pressure on his wheelchair. There is no evidence obviously of infection no purulence no warmth He has a dry scaled area on the plantar aspect of the left first second toe Improved area on the dorsal right first second toe. 09/29/17; left lateral leg wound continues to improve in dimensions mostly with an is still a fairly long but increasingly narrow wound. He has a dry scaled area on the plantar aspect of his left first second toe web space Increasingly concerning area on the dorsal right first second toe. In fact I am concerned today about possible cellulitis around this wound. The  areas extending up his second toe and although there is deformities here almost appears to abut on the nailbed. 10/06/17; left lateral leg wound continues to make very gradual progress. Tissue culture I did from the right first second toe dorsal foot last time grew MRSA and enterococcus which was vancomycin sensitive. This was not sensitive to clindamycin or doxycycline. He is allergic to Zyvox and sulfa we have therefore arrange for him to have dalvance infusion tomorrow. He is had this in the past and tolerated it well 10/20/17; left lateral leg wound continues to make decent progress. This is certainly reduced in terms of with there is advancing epithelialization.The cellulitis in the right foot looks better although he still has a deep wound in the dorsal aspect of the first second toe web space. Plantar left first toe web space on the left I think is making some progress 10/27/17; left lateral leg wound continues to make decent  progress. Advancing epithelialization.using Hydrofera Blue The right first second toe web space wound is better-looking using silver alginate Improvement in the left plantar first second toe web space. Again using silver alginate 11/03/17 left lateral leg wound continues to make decent progress albeit slowly. Using [facility] The right per second toe web space continues to be a very problematic looking punched out wound. I obtained a piece of tissue for deep culture I did extensively treated this for fungus. It is difficult to imagine that this is a pressure area as the patient states other than going outside he doesn't really wear shoes at home The left plantar first second toe web space looked fairly senescent. Necrotic edges. This required debridement change to [facility] Blue to all wound areas 11/10/17; left lateral leg wound continues to contract. Using Hydrofera Blue On the right dorsal first second toe web space dorsally. Culture I did of this area last week  grew MRSA there is not an easy oral option in this patient was multiple antibiotic allergies or intolerances. This was only a rare culture isolate I'm therefore going to use Bactroban under silver alginate On the left plantar first second toe web space. Debridement is required here. This is also unchanged 11/17/17; left lateral leg wound continues to contract using Hydrofera Blue this is no longer the major issue. The major concern here is the right first second toe web space. He now has an open area going from dorsally to the plantar aspect. There is now wound on the inner lateral part of the first toe. Not a very viable surface on this. There is erythema spreading medially into the forefoot. No major change in the left first second toe plantar wound 11/24/17; left lateral leg wound continues to contract using Hydrofera Blue. Nice improvement today The right first second toe web space all of this looks a lot less angry than last week. I have given him clindamycin and topical Bactroban for MRSA and terbinafine for the possibility of underlining tinea pedis that I could not control with ketoconazole. Looks somewhat better The area on the plantar left first second toe web space is weeping with dried debris around the wound 12/01/17; left lateral leg wound continues to contract he Hydrofera Blue. It is becoming thinner in terms of with nevertheless it is making good improvement. Dame, IKE MARAGH ([id number]) [id number] Page 8 of 39 The right first second toe web space looks less angry but still a large necrotic-looking wounds starting on the plantar aspect of the right foot extending between the toes and now extensively on the base of the right second toe. I gave him clindamycin and topical Bactroban for MRSA anterior benefiting for the possibility of underlying tinea pedis. Not looking better today The area on the left first/second toe looks better. Debrided of necrotic  debris 12/05/17* the patient was worked in urgently today because over the weekend he found blood on his incontinence bad when he woke up. He was found to have an ulcer by his wife who does most of his wound care. He came in today for [place] to look at this. He has not had a history of wounds in his buttocks in spite of his paraplegia. 12/08/17; seen in follow-up today at his usual appointment. He was seen earlier this week and found to have a new wound on his buttock. We also follow him for wounds on the left lateral leg, left first second toe web space and right first second toe web space 12/15/17; we  have been using Hydrofera Blue to the left lateral leg which has improved. The right first second toe web space has also improved. Left first second toe web space plantar aspect looks stable. The left buttock has worsened using Santyl. Apparently the buttock has drainage 12/22/17; we have been using Hydrofera Blue to the left lateral leg which continues to improve now 2 small wounds separated by normal skin. He tells [place] he had a fever up to 100 yesterday he is prone to UTIs but has not noted anything different. He does in and out catheterizations. The area between the first and second toes today does not look good necrotic surface covered with what looks to be purulent drainage and erythema extending into the third toe. I had gotten this to something that I thought look better last time however it is not look good today. He also has a necrotic surface over the buttock wound which is expanded. I thought there might be infection under here so I removed a lot of the surface with a #5 curet though nothing look like it really needed culturing. He is been using Santyl to this area 12/27/17; his original wound on the left lateral leg continues to improve using Hydrofera Blue. I gave him samples of Baxdella although he was unable to take them out of fear for an allergic reaction ["lump in his throat"].the culture I did  of the purulent drainage from his second toe last week showed both enterococcus and a set Enterobacter I was also concerned about the erythema on the bottom of his foot although paradoxically although this looks somewhat better today. 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 We've been using silver alginate here. They came up today that he sleeps in a recliner, I'm not sure why but I asked him to stop this 01/03/18; his original wound we've been using Hydrofera Blue is now separated into 2 areas. Ulcer on his left buttock is better he is off the recliner and sleeping in bed Finally both wound areas between his first and second toes also looks some better 01/10/18; his original wound on the left lateral leg is now separated into 2 wounds we've been using Hydrofera Blue Ulcer on his left buttock has some drainage. There is a small probing site going into muscle layer superiorly.using silver alginate -He arrives today with a deep tissue injury on the left heel The wound on the dorsal aspect of his first second toe on the left looks a lot betterusing silver alginate ketoconazole The area on the first second toe web space on the right also looks a lot bette 01/17/18; his original wound on the left lateral leg continues to progress using Hydrofera Blue Ulcer on his left buttock also is smaller surface healthier except for a small probing site going into the muscle layer superiorly. 2.4 cm of tunneling in this area DTI on his left heel we have only been offloading. Looks better than last week no threatened open no evidence of infection the wound on the dorsal aspect of the first second toe on the left continues to look like it's regressing we have only been using silver alginate and terbinafine orally The area in the first second toe web space on the right also looks to be a lot better using silver alginate and  terbinafine I think this was prompted by tinea pedis 01/31/18; the patient was hospitalized in [place] last week  apparently for a complicated UTI. He was discharged on cefepime he does in and out catheterizations. In the hospital he was discovered M I don't mild elevation of AST and ALT and the terbinafine was stopped.predictably the pressure ulcer on s his buttock looks betterusing silver alginate. The area on the left lateral leg also is better using Hydrofera Blue. The area between the first and second toes on the left better. First and second toes on the right still substantial but better. Finally the DTI on the left heel has held together and looks like it's resolving 02/07/18-he is here in follow-up evaluation for multiple ulcerations. He has new injury to the lateral aspect of the last issue a pressure ulcer, he states this is from adhesive removal trauma. He states he has tried multiple adhesive products with no success. All other ulcers appear stable. The left heel DTI is resolving. We will continue with same treatment plan and follow-up next week. 02/14/18; follow-up for multiple areas. He has a new area last week on the lateral aspect of his pressure ulcer more over the posterior trochanter. The original pressure ulcer looks quite stable has healthy granulation. 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 Almost fully epithelialized on the original superior area using Hydrofera Blue DTI on the left heel has peeled off this week to reveal a small superficial wound under denuded skin and subcutaneous tissue Both areas between the first and second toes look better including nothing open on the left 02/21/18; The patient's wounds on his left ischial tuberosity and posterior left greater trochanter actually looked better. He has a large area of irritation around the area which I think is  contact dermatitis. I am doubtful that this is fungal His original wound on the left lateral calf continues to improve we have been using Hydrofera Blue There is no open area in the left first second toe web space although there is a lot of thick callus The DTI on the left heel required debridement today of necrotic surface eschar and subcutaneous tissue using silver alginate Finally the area on the right first second toe webspace continues to contract using silver alginate and ketoconazole 02/28/18 Left ischial tuberosity wounds look better using silver alginate. Original wound on the left calf only has one small open area left using Hydrofera Blue DTI on the left heel required debridement mostly removing skin from around this wound surface. Using silver alginate The areas on the right first/second toe web space using silver alginate and ketoconazole 03/08/18 on evaluation today patient appears to be doing decently well as best I can tell in regard to his wounds. This is the first time that I have seen him as he generally is followed by Dr. Leanord Hawking. With that being said none of his wounds appear to be infected he does have an area where there is some skin covering what appears to be a new wound on the left dorsal surface of his great toe. This is right at the nail bed. With that being said I do believe that debrided away some of the excess skin can be of benefit in this regard. Otherwise he has been tolerating the dressing changes without complication. 03/14/18; patient arrives today with the multiplicity of wounds that we are following. He has not been systemically unwell Original wound on the left lateral calf now only has 2 small open areas we've been using Hydrofera Blue which  should continue The deep tissue injury on the left heel requires debridement today. We've been using silver alginate The left first second toe and the right first second toe are both are reminiscence what I think was tinea  pedis. Apparently some of the callus Surface between the toes was removed last week when it started draining. Purulent drainage coming from the wound on the ischial tuberosity on the left. 03/21/18-He is here in follow-up evaluation for multiple wounds. 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 He tolerated debridement. The only change to last week's recommendations is to discontinue antifungal cream between toes. He will follow-up next week 03/28/18; following up for multiple wounds;Concern this week is streaking redness and swelling in the right foot. He is going to need antibiotics for this. 03/31/18; follow-up for right foot cellulitis. Streaking redness and swelling in the right foot on 03/28/18. 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 I put him on clindamycin 300 mg every 6 and brought him in for a quick check. He has an open wound between his first and second toes on the right foot as a potential source. 04/04/18; Right foot cellulitis is resolving he is completing clindamycin. This is truly good news Left lateral calf wound which is initial wound only has one small open area inferiorly this is close to healing out. He has compression stockings. We will use Lemler, Ferrel E ([id number]) [phone].pdf Page 9 of 39 Hydrofera Blue right down to the epithelialization of this Nonviable surface on the left heel which was initially pressure with a DTI. We've been using Hydrofera Blue. I'm going to switch this back to silver alginate Left first second toe/tinea pedis this looks better using silver alginate Right first second toe tinea pedis using silver alginate Large pressure ulcers on theLeft ischial tuberosity. Small wound here Looks better. I am uncertain about the surface over the large wound. Using silver alginate 04/11/18; Cellulitis in the right foot is resolved Left lateral  calf wound which was his original wounds still has 2 tiny open areas remaining this is just about closed Nonviable surface on the left heel is better but still requires debridement Left first second toe/tinea pedis still open using silver alginate Right first second toe wound tinea pedis I asked him to go back to using ketoconazole and silver alginate Large pressure ulcers on the left ischial tuberosity this shear injury here is resolved. Wound is smaller. No evidence of infection using silver alginate 04/18/18; Patient arrives with an intense area of cellulitis in the right mid lower calf extending into the right heel area. Bright red and warm. Smaller area on the left anterior leg. He has a significant history of MRSA. He will definitely need antibioticsdoxycycline He now has 2 open areas on the left ischial tuberosity the original large wound and now a satellite area which I think was above his initial satellite areas. Not a wonderful surface on this satellite area surrounding erythema which looks like pressure related. His left lateral calf wound again his original wound is just about closed Left heel pressure injury still requiring debridement Left first second toe looks a lot better using silver alginate Right first second toe also using silver alginate and ketoconazole cream also looks better 04/20/18; the patient was worked in early today out of concerns with his cellulitis on the right leg. I had started him on doxycycline. This was 2 days ago. His wife was concerned about the  swelling in the area. Also concerned about the left buttock. He has not been systemically unwell no fever chills. No nausea vomiting or diarrhea 04/25/18; the patient's left buttock wound is continued to deteriorate he is using Hydrofera Blue. He is still completing clindamycin for the cellulitis on the right leg although all of this looks better. 05/02/18 Left buttock wound still with a lot of drainage and a very  tightly adherent fibrinous necrotic surface. He has a deeper area superiorly The left lateral calf wound is still closed DTI wound on the left heel necrotic surface especially the circumference using Iodoflex Areas between his left first second toe and right first second toe both look better. Dorsally and the right first second toe he had a necrotic surface although at smaller. In using silver alginate and ketoconazole. I did a culture last week which was a deep tissue culture of the reminiscence of the open wound on the right first second toe dorsally. This grew a few Acinetobacter and a few methicillin-resistant staph aureus. Nevertheless the area actually this week looked better. I didn't feel the need to specifically address this at least in terms of systemic antibiotics. 05/09/18; wounds are measuring larger more drainage per our intake. We are using Santyl covered with alginate on the large superficial buttock wounds, Iodosorb on the left heel, ketoconazole and silver alginate to the dorsal first and second toes bilaterally. 05/16/18; The area on his left buttock better in some aspects although the area superiorly over the ischial tuberosity required an extensive debridement.using Santyl Left heel appears stable. Using Iodoflex The areas between his first and second toes are not bad however there is spreading erythema up the dorsal aspect of his left foot this looks like cellulitis again. He is insensate the erythema is really very brilliant.o Erysipelas He went to see an allergist days ago because he was itching part of this he had lab work done. This showed a white count of 15.1 with 70% neutrophils. Hemoglobin of 11.4 and a platelet count of 659,000. Last white count we had in Epic was a 2-1/2 years ago which was 25.9 but he was ill at the time. He was able to show me some lab work that was done by his primary physician the pattern is about the same. 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 But prompted me to go ahead and do x-rays of both feet and the pelvis rule out osteomyelitis. He also had a comprehensive metabolic panel this was reasonably normal his albumin was 3.7 liver function tests BUN/creatinine all normal 05/23/18; x-rays of both his feet from last week were negative for underlying pulmonary abnormality. The x-ray of his pelvis however showed mild irregularity in the left ischial which may represent some early osteomyelitis. The wound in the left ischial continues to get deeper clearly now exposed muscle. Each week necrotic surface material over this area. Whereas the rest of the wounds do not look so bad. The left ischial wound we have been using Santyl and calcium alginate T the left heel surface necrotic debris using Iodoflex o The left lateral leg is still healed Areas on the left dorsal foot and the right dorsal foot are about the same. There is some inflammation on the left which might represent contact dermatitis, fungal dermatitis I am doubtful cellulitis although this looks better than last week 05/30/18; CT scan done at Hospital did not show any osteomyelitis or abscess. Suggested the possibility  of underlying cellulitis although I don't see a lot of evidence of this at the bedside The wound itself on the left buttock/upper thigh actually looks somewhat better. No debridement Left heel also looks better no debridement continue Iodoflex Both dorsal first second toe spaces appear better using Lotrisone. Left still required debridement 06/06/18; Intake reported some purulent looking drainage from the left gluteal wound. Using Santyl and calcium alginate Left heel looks better although still a nonviable surface requiring debridement The left dorsal foot first/second webspace actually expanding and somewhat deeper. I may consider doing a shave biopsy of this area Right dorsal foot first/second webspace appears stable to  improved. Using Lotrisone and silver alginate to both these areas 06/13/18 Left gluteal surface looks better. Now separated in the 2 wounds. No debridement required. Still drainage. We'll continue silver alginate Left heel continues to look better with Iodoflex continue this for at least another week Of his dorsal foot wounds the area on the left still has some depth although it looks better than last week. We've been using Lotrisone and silver alginate 06/20/18 Left gluteal continues to look better healthy tissue Left heel continues to look better healthy granulation wound is smaller. He is using Iodoflex and his long as this continues continue the Iodoflex Dorsal right foot looks better unfortunately dorsal left foot does not. There is swelling and erythema of his forefoot. He had minor trauma to this several days ago but doesn't think this was enough to have caused any tissue injury. Foot looks like cellulitis, we have had this problem before 06/27/18 on evaluation today patient appears to be doing a little worse in regard to his foot ulcer. 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 Both of which would really be his only options for treating this infection. In the past he has been given and effusion of Orbactiv. This is done very well for him in the past again it's one time dosing IV antibiotic therapy. Subsequently I do believe this is something we're gonna need to see about doing at this point in time. Currently his other wounds seem to be doing somewhat better in my pinion I'm pretty happy in that regard. 07/03/18 on evaluation today patient's wounds actually appear to be doing fairly well. He has been tolerating the dressing changes without complication. All in all he seems to be showing signs of improvement. In regard to the antibiotics he has been dealing  with infectious disease since I saw him last week as far as getting this scheduled. In the end [facility]'s [facility] this coming Friday. In the meantime he has been continuing to perform the dressing changes in such as previous. There does not appear to be any evidence of infection worsengin at this time. 07/10/18; Since I last saw this man 2 weeks ago things have actually improved. IV antibiotics of resulted in less forefoot erythema although there is still some present. He is not systemically unwell Left buttock wounds 2 now have no depth there is increased epithelialization Using silver alginate Domingos, Danyon E ([id number]) [id number] Page 10 of 39 Left heel still requires debridement using Iodoflex Left dorsal foot still with a sizable wound about the size of a border but healthy granulation Right dorsal foot still with a slitlike area using silver alginate 07/18/18; the  patient's cellulitis in the left foot is improved in fact I think it is on its way to resolving. Left buttock wounds 2 both look better although the larger one has hypertension granulation we've been using silver alginate Left heel has some thick circumferential redundant skin over the wound edge which will need to be removed today we've been using Iodoflex Left dorsal foot is still a sizable wound required debridement using silver alginate The right dorsal foot is just about closed only a small open area remains here 07/25/18; left foot cellulitis is resolved Left buttock wounds 2 both look better. Hyper-granulation on the major area Left heel as some debris over the surface but otherwise looks a healthier wound. Using silver collagen Right dorsal foot is just about closed 07/31/18; arrives with our intake nurse worried about purulent drainage from the buttock. We had hyper-granulation here last week His buttock wounds 2 continue to look better Left  heel some debris over the surface but measuring smaller. Right dorsal foot unfortunately has openings between the toes Left foot superficial wound looks less aggravated. 08/07/18 Buttock wounds continue to look better although some of her granulation and the larger medial wound. silver alginate Left heel continues to look a lot better.silver collagen Left foot superficial wound looks less stable. Requires debridement. He has a new wound superficial area on the foot on the lateral dorsal foot. Right foot looks better using silver alginate without Lotrisone 08/14/2018; patient was in the ER last week diagnosed with a UTI. He is now on Cefpodoxime and Macrodantin. Buttock wounds continued to be smaller. Using silver alginate Left heel continues to look better using silver collagen Left foot superficial wound looks as though it is improving Right dorsal foot area is just about healed. 08/21/2018; patient is completed his antibiotics for his UTI. He has 2 open areas on the buttocks. There is still not closed although the surface looks satisfactory. Using silver alginate Left heel continues to improve using silver collagen The bilateral dorsal foot areas which are at the base of his first and second toes/possible tinea pedis are actually stable on the left but worse on the right. The area on the left required debridement of necrotic surface. After debridement I obtained a specimen for PCR culture. The right dorsal foot which is been just about healed last week is now reopened 08/28/2018; culture done on the left dorsal foot showed coag negative staph both staph epidermidis and Lugdunensis. I think this is worthwhile initiating systemic treatment. I will use doxycycline given his long list of allergies. The area on the left heel slightly improved but still requiring debridement. The large wound on the buttock is just about closed whereas the smaller one is larger. Using silver alginate in this  area 09/04/2018; patient is completing his doxycycline for the left foot although this continues to be a very difficult wound area with very adherent necrotic debris. We are using silver alginate to all his wounds right foot left foot and the small wounds on his buttock, silver collagen on the left heel. 09/11/2018; once again this patient has intense erythema and swelling of the left forefoot. Lesser degrees of erythema in the right foot. He has a long list of allergies and intolerances. I will reinstitute doxycycline. 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 Using silver alginate Left heel also looks better using silver collagen Unfortunately both the areas on his feet look worse. The area on the  left first second webspace is now gone through to the plantar part of his foot. The area on the left foot anteriorly is irritated with erythema and swelling in the forefoot. 09/25/2018 His wound on the left plantar heel looks better. Using silver collagen The area on the left buttock 2 small remnant areas. One is closed one is still open. Using silver alginate The areas between both his first and second toes look worse. This in spite of long-standing antifungal therapy with ketoconazole and silver alginate which should have antifungal activity He has small areas around his original wound on the left calf one is on the bottom of the original scar tissue and one superiorly both of these are small and superficial but again given wound history in this site this is worrisome 10/02/2018 Left plantar heel continues to gradually contract using silver collagen Left buttock wound is unchanged using silver alginate The areas on his dorsal feet between his first and second toes bilaterally look about the same. I prescribed clindamycin ointment to see if we can address chronic staph colonization and also the underlying possibility of erythrasma The left lateral lower  extremity wound is actually on the lateral part of his ankle. Small open area here. We have been using silver alginate 10/09/2018; Left plantar heel continues to look healthy and contract. No debridement is required Left buttock slightly smaller with a tape injury wound just below which was new this week Dorsal feet somewhat improved I have been using clindamycin Left lateral looks lower extremity the actual open area looks worse although a lot of this is epithelialized. I am going to change to silver collagen today He has a lot more swelling in the right leg although this is not pitting not red and not particularly warm there is a lot of spasm in the right leg usually indicative of people with paralysis of some underlying discomfort. We have reviewed his vascular status from 2017 he had a left greater saphenous vein ablation. I wonder about referring him back to vascular surgery if the area on the left leg continues to deteriorate. 10/16/2018 in today for follow-up and management of multiple lower extremity ulcers. His left Buttock wound is much lower smaller and almost closed completely. The wound to the left ankle has began to reopen with Epithelialization and some adherent slough. He has multiple new areas to the left foot and leg. The left dorsal foot without much improvement. Wound present between left great webspace and 2nd toe. Erythema and edema present right leg. Right LE ultrasound obtained on 10/10/18 was negative for DVT . 10/23/2018; Left buttock is closed over. Still dry macerated skin but there is no open wound. I suspect this is chronic pressure/moisture Left lateral calf is quite a bit worse than when I saw this last. There is clearly drainage here he has macerated skin into the left plantar heel. We will change the primary dressing to alginate Left dorsal foot has some improvement in overall wound area. Still using clindamycin and silver alginate Right dorsal foot about the same  as the left using clindamycin and silver alginate The erythema in the right leg has resolved. He is DVT rule out was negative Left heel pressure area required debridement although the wound is smaller and the surface is health 10/26/2018 The patient came back in for his nurse check today predominantly because of the drainage coming out of the left lateral leg with a recent reopening of his original wound on the left lateral calf. He  comes in today with a large amount of surrounding erythema around the wound extending from the calf into the ankle and even in the area on the dorsal foot. He is not systemically unwell. He is not febrile. Nevertheless this looks like cellulitis. We have been using silver alginate to the area. I changed him to a regular visit and I am going to prescribe him doxycycline. The rationale here is a long list of medication intolerances and a history of MRSA. I did not see anything that I thought would provide a valuable culture 10/30/2018 Follow-up from his appointment 4 days ago with really an extensive area of cellulitis in the left calf left lateral ankle and left dorsal foot. I put him on doxycycline. He has a long list of medication allergies which are true allergy reactions. Also concerning since the MRSA he has cultured in the past I think Castillo, Gryphon E ([id number]) [id number] Page 11 of 39 episodically has been tetracycline resistant. In any case he is a lot better today. The erythema especially in the anterior and lateral left calf is better. He still has left ankle erythema. He also is complaining about increasing edema in the right leg we have only been using Kerlix Coban and he has been doing the wraps at home. Finally he has a spotty rash on the medial part of his upper left calf which looks like folliculitis or perhaps wrap occlusion type injury. Small superficial macules not pustules 11/06/18 patient arrives today with again a  considerable degree of erythema around the wound on the left lateral calf extending into the dorsal ankle and dorsal foot. This is a lot worse than when I saw this last week. He is on doxycycline really with not a lot of improvement. He has not been systemically unwell Wounds on the; left heel actually looks improved. Original area on the left foot and proximity to the first and second toes looks about the same. He has superficial areas on the dorsal foot, anterior calf and then the reopening of his original wound on the left lateral calf which looks about the same The only area he has on the right is the dorsal webspace first and second which is smaller. He has a large area of dry erythematous skin on the left buttock small open area here. 11/13/2018; the patient arrives in much better condition. The erythema around the wound on the left lateral calf is a lot better. Not sure whether this was the clindamycin or the TCA and ketoconazole or just in the improvement in edema control [stasis dermatitis]. In any case this is a lot better. The area on the left heel is very small and just about resolved using silver collagen we have been using silver alginate to the areas on his dorsal feet 11/20/2018; his wounds include the left lateral calf, left heel, dorsal aspects of both feet just proximal to the first second webspace. He is stable to slightly improved. I did not think any changes to his dressings were going to be necessary 11/27/2018 he has a reopening on the left buttock which is surrounded by what looks like tinea or perhaps some other form of dermatitis. The area on the left dorsal foot has some erythema around it I have marked this area but I am not sure whether this is cellulitis or not. Left heel is not closed. Left calf the reopening is really slightly longer and probably worse 1/13; in general things look better and smaller except for the left dorsal  foot. Area on the left heel is just about  closed, left buttock looks better only a small wound remains in the skin looks better [using Lotrisone] 1/20; the area on the left heel only has a few remaining open areas here. Left lateral calf about the same in terms of size, left dorsal foot slightly larger right lateral foot still not closed. The area on the left buttock has no open wound and the surrounding skin looks a lot better 1/27; the area on the left heel is closed. Left lateral calf better but still requiring extensive debridements. The area on his left buttock is closed. He still has the open areas on the left dorsal foot which is slightly smaller in the right foot which is slightly expanded. We have been using Iodoflex on these areas as well 2/3; left heel is closed. Left lateral calf still requiring debridement using Iodoflex there is no open area on his left buttock however he has dry scaly skin over a large area of this. Not really responding well to the Lotrisone. Finally the areas on his dorsal feet at the level of the first second webspace are slightly smaller on the right and about the same on the left. Both of these vigorously debrided with Anasept and gauze 2/10; left heel remains closed he has dry erythematous skin over the left buttock but there is no open wound here. Left lateral leg has come in and with. Still requiring debridement we have been using Iodoflex here. Finally the area on the left dorsal foot and right dorsal foot are really about the same extremely dry callused fissured areas. He does not yet have a dermatology appointment 2/17; left heel remains closed. He has a new open area on the left buttock. The area on the left lateral calf is bigger longer and still covered in necrotic debris. No major change in his foot areas bilaterally. I am awaiting for a dermatologist to look on this. We have been using ketoconazole I do not know that this is been doing any good at all. 2/24; left heel remains closed. The left  buttock wound that was new reopening last week looks better. The left lateral calf appears better also although still requires debridement. The major area on his foot is the left first second also requiring debridement. We have been putting Prisma on all wounds. I do not believe that the ketoconazole has done too much good for his feet. He will use Lotrisone I am going to give him a 2-week course of terbinafine. We still do not have a dermatology appointment 3/2 left heel remains closed however there is skin over bone in this area I pointed this out to him today. The left buttock wound is epithelialized but still does not look completely stable. The area on the left leg required debridement were using silver collagen here. With regards to his feet we changed to Lotrisone last week and silver alginate. 3/9; left heel remains closed. Left buttock remains closed. The area on the right foot is essentially closed. The left foot remains unchanged. Slightly smaller on the left lateral calf. Using silver collagen to both of these areas 3/16-Left heel remains closed. Area on right foot is closed. Left lateral calf above the lateral malleolus open wound requiring debridement with easy bleeding. Left dorsal wound proximal to first toe also debrided. Left ischial area open new. Patient has been using Prisma with wrapping every 3 days. Dermatology appointment is apparently tomorrow.Patient has completed his terbinafine 2-week course  with some apparent improvement according to him, there is still flaking and dry skin in his foot on the left 3/23; area on the right foot is reopened. The area on the left anterior foot is about the same still a very necrotic adherent surface. He still has the area on the left leg and reopening is on the left buttock. He apparently saw dermatology although I do not have a note. According to the patient who is usually fairly well informed they did not have any good ideas. Put him on  oral terbinafine which she is been on before. 3/30; using silver collagen to all wounds. Apparently his dermatologist put him on doxycycline and rifampin presumably some culture grew staph. I do not have this result. He remains on terbinafine although I have used terbinafine on him before 4/6; patient has had a fairly substantial reopening on the right foot between the first and second toes. He is finished his terbinafine and I believe is on doxycycline and rifampin still as prescribed by dermatology. We have been using silver collagen to all his wounds although the patient reports that he thinks silver alginate does better on the wounds on his buttock. 4/13; the area on his left lateral calf about the same size but it did not require debridement. Left dorsal foot just proximal to the webspace between the first and second toes is about the same. Still nonviable surface. I note some superficial bronze discoloration of the dorsal part of his foot Right dorsal foot just proximal to the first and second toes also looks about the same. I still think there may be the same discoloration I noted above on the left Left buttock wound looks about the same 4/20; left lateral calf appears to be gradually contracting using silver collagen. 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 The left dorsal foot wound is debrided of tightly adherent necrotic debris and really cleans up quite nicely. The right area is worse with expansion. I did not debride this it is now over the base of the second toe The area on his left buttock is smaller no debridement is required using silver collagen 5/4; left calf continues to make good progress. He arrives with erythema around the wounds on his dorsal foot which even extends to the plantar aspect. Very concerning for coexistent infection. He is finished the erythromycin I gave him for possible erythrasma this does not seem to  have helped. The area on the left foot is about the same base of the dorsal toes Is area on the buttock looks improved on the left 5/11; left calf and left buttock continued to make good progress. Left foot is about the same to slightly improved. Major problem is on the right foot. He has not had an x-ray. Deep tissue culture I did last week showed both Enterobacter and E. coli. I did not change the doxycycline I put him on empirically although neither 1 of these were plated to doxycycline. He arrives today with the erythema looking worse on both the dorsal and plantar foot. Macerated skin on the bottom of the foot. he has not been systemically unwell 5/18-Patient returns at 1 week, left calf wound appears to be making some progress, left buttock wound appears slightly worse than last time, left foot wound looks slightly better, right foot redness is marginally better. X-ray of both feet show no air or evidence of osteomyelitis. Patient is finished his Omnicef and terbinafine. He continues to have  macerated skin on the bottom of the left foot as well as right 5/26; left calf wound is better, left buttock wound appears to have multiple small superficial open areas with surrounding macerated skin. X-rays that I did last time showed no evidence of osteomyelitis in either foot. He is finished cefdinir and doxycycline. I do not think that he was on terbinafine. He continues to have a large superficial open area on the right foot anterior dorsal and slightly between the first and second toes. I did send him to dermatology 2 months ago or so wondering about whether they would do a fungal scraping. I do not believe they did but did do a culture. We have been using silver alginate to the toe areas, he has been using antifungals at home topically either ketoconazole or Lotrisone. We are using silver collagen on the left foot, silver alginate on the right, silver collagen on the left lateral leg and silver  alginate on the left buttock 6/1; left buttock area is healed. We have the left dorsal foot, left lateral leg and right dorsal foot. We are using silver alginate to the areas on both feet and silver collagen to the area on his left lateral calf 6/8; the left buttock apparently reopened late last week. He is not really sure how this happened. He is tolerating the terbinafine. Using silver alginate to all Sforza, Isahi E ([id number]) [id number] Page 12 of 39 wounds 6/15; left buttock wound is larger than last week but still superficial. Came in the clinic today with a report of purulence from the left lateral leg I did not identify any infection Both areas on his dorsal feet appear to be better. He is tolerating the terbinafine. Using silver alginate to all wounds 6/22; left buttock is about the same this week, left calf quite a bit better. His left foot is about the same however he comes in with erythema and warmth in the right forefoot once again. Culture that I gave him in the beginning of May showed Enterobacter and E. coli. I gave him doxycycline and things seem to improve although neither 1 of these organisms was specifically plated. 6/29; left buttock is larger and dry this week. Left lateral calf looks to me to be improved. Left dorsal foot also somewhat improved right foot completely unchanged. The erythema on the right foot is still present. He is completing the Ceftin dinner that I gave him empirically [see discussion above.) 7/6 - All wounds look to be stable and perhaps improved, the left buttock wound is slightly smaller, per patient bleeds easily, completed ceftin, the right foot redness is less, he is on terbinafine 7/13; left buttock wound about the same perhaps slightly narrower. Area on the left lateral leg continues to narrow. Left dorsal foot slightly smaller right foot about the same. We are using silver alginate on the right foot and Hydrofera Blue to  the areas on the left. Unna boot on the left 2 layer compression on the right 7/20; left buttock wound absolutely the same. Area on lateral leg continues to get better. Left dorsal foot require debridement as did the right no major change in the 7/27; left buttock wound the same size necrotic debris over the surface. The area on the lateral leg is closed once again. His left foot looks better right foot about the same although there is some involvement now of the posterior first second toe area. He is still on terbinafine which I have given him for a month,  not certain a centimeter major change 06/25/19-All wounds appear to be slightly improved according to report, left buttock wound looks clean, both foot wounds have minimal to no debris the right dorsal foot has minimal slough. We are using Hydrofera Blue to the left and silver alginate to the right foot and ischial wound. 8/10-Wounds all appear to be around the same, the right forefoot distal part has some redness which was not there before, however the wound looks clean and small. Ischial wound looks about the same with no changes 8/17; his wound on the left lateral calf which was his original chronic venous insufficiency wound remains closed. Since I last saw him the areas on the left dorsal foot right dorsal foot generally appear better but require debridement. The area on his left initial tuberosity appears somewhat larger to me perhaps hyper granulated and bleeds very easily. We have been using Hydrofera Blue to the left dorsal foot and silver alginate to everything else 8/24; left lateral calf remains closed. The areas on his dorsal feet on the webspace of the first and second toes bilaterally both look better. The area on the left buttock which is the pressure ulcer stage II slightly smaller. I change the dressing to Hydrofera Blue to all areas 8/31; left lateral calf remains closed. The area on his dorsal feet bilaterally look better. Using  Hydrofera Blue. Still requiring debridement on the left foot. No change in the left buttock pressure ulcers however 9/14; left lateral calf remains closed. Dorsal feet look quite a bit better than 2 weeks ago. Flaking dry skin also a lot better with the ammonium lactate I gave him 2 weeks ago. The area on the left buttock is improved. He states that his Roho cushion developed a leak and he is getting a new one, in the interim he is offloading this vigorously 9/21; left calf remains closed. Left heel which was a possible DTI looks better this week. He had macerated tissue around the left dorsal foot right foot looks satisfactory and improved left buttock wound. I changed his dressings to his feet to silver alginate bilaterally. Continuing Hydrofera Blue on the left buttock. 9/28 left calf remains closed. Left heel did not develop anything [possible DTI] dry flaking skin on the left dorsal foot. Right foot looks satisfactory. Improved left buttock wound. We are using silver alginate on his feet Hydrofera Blue on the buttock. I have asked him to go back to the Lotrisone on his feet including the wounds and surrounding areas 10/5; left calf remains closed. The areas on the left and right feet about the same. A lot of this is epithelialized however debris over the remaining open areas. He is using Lotrisone and silver alginate. The area on the left buttock using Hydrofera Blue 10/26. Patient has been out for 3 weeks secondary to Covid concerns. He tested negative but I think his wife tested positive. He comes in today with the left foot substantially worse, right foot about the same. Even more concerning he states that the area on his left buttock closed over but then reopened and is considerably deeper in one aspect than it was before [stage III wound] 11/2; left foot really about the same as last week. Quarter sized wound on the dorsal foot just proximal to the first second toes. Surrounding erythema  with areas of denuded epithelium. This is not really much different looking. Did not look like cellulitis this time however. Right foot area about the same.. We have been using  silver alginate alginate on his toes Left buttock still substantial irritated skin around the wound which I think looks somewhat better. We have been using Hydrofera Blue here. 11/9; left foot larger than last week and a very necrotic surface. Right foot I think is about the same perhaps slightly smaller. Debris around the circumference also addressed. Unfortunately on the left buttock there is been a decline. Satellite lesions below the major wound distally and now a an additional one posteriorly we have been using Hydrofera Blue but I think this is a pressure issue 11/16; left foot ulcer dorsally again a very adherent necrotic surface. Right foot is about the same. Not much change in the pressure ulcer on his left buttock. 11/30; left foot ulcer dorsally basically the same as when I saw him 2 weeks ago. Very adherent fibrinous debris on the wound surface. Patient reports a lot of drainage as well. 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 We have been using Iodoflex, patient changed back to alginate because of the drainage. Area on his right dorsal foot really looks benign with a healthier surface certainly a lot better than on the left. Left buttock wounds all improved using Hydrofera Blue 12/7; left dorsal foot again no improvement. Tightly adherent debris. PCR culture I did last week only showed likely skin contaminant. I have gone ahead and done a punch biopsy of this which is about the last thing in terms of investigations I can think to do. He has known venous insufficiency and venous hypertension and this could be the issue here. The area on the right foot is about the same left buttock slightly worse according to our intake nurse secondary to [facility] Blue sticking to  the wound 12/14; biopsy of the left foot that I did last time showed changes that could be related to wound healing/chronic stasis dermatitis phenomenon no neoplasm. We have been using silver alginate to both feet. I change the one on the left today to Sorbact and silver alginate to his other 2 wounds 12/28; the patient arrives with the following problems; Major issue is the dorsal left foot which continues to be a larger deeper wound area. Still with a completely nonviable surface Paradoxically the area mirror image on the right on the right dorsal foot appears to be getting better. He had some loss of dry denuded skin from the lower part of his original wound on the left lateral calf. Some of this area looked a little vulnerable and for this reason we put him in wrap that on this side this week The area on his left buttock is larger. He still has the erythematous circular area which I think is a combination of pressure, sweat. This does not look like cellulitis or fungal dermatitis 11/26/2019; -Dorsal left foot large open wound with depth. Still debris over the surface. Using Sorbact The area on the dorsal right foot paradoxically has closed over He has a reopening on the left ankle laterally at the base of his original wound that extended up into the calf. This appears clean. The left buttock wound is smaller but with very adherent necrotic debris over the surface. We have been using silver alginate here as well The patient had arterial studies done in 2017. He had biphasic waveforms at the dorsalis pedis and posterior tibial bilaterally. ABI in the left was 1.17. Digit waveforms were dampened. He has slight spasticity in the great toes I do not think a TBI would  be possible 1/11; the patient comes in today with a sizable reopening between the first and second toes on the right. This is not exactly in the same location where we have been treating wounds previously. According to our intake nurse  this was actually fairly deep but 0.6 cm. The area on the left dorsal foot looks about the same the surface is somewhat cleaner using Sorbact, his MRI is in 2 days. We have not managed yet to get arterial studies. The new reopening on the left lateral calf looks somewhat better using alginate. The left buttock wound is about the same using alginate 1/18; the patient had his ARTERIAL studies which were quite normal. ABI in the right at 1.13 with triphasic/biphasic waveforms on the left ABI 1.06 again with triphasic/biphasic waveforms. It would not have been possible to have done a toe brachial index because of spasticity. We have been using Sorbac to the left foot alginate to the rest of his wounds on the right foot left lateral calf and left buttock 1/25; arrives in clinic with erythema and swelling of the left forefoot worse over the first MTP area. This extends laterally dorsally and but also posteriorly. Still has an area on the left lateral part of the lower part of his calf wound it is eschared and clearly not closed. Area on the left buttock still with surrounding irritation and erythema. Alsobrook, KACE HARTJE ([id number]) [id number] Page 13 of 39 Right foot surface wound dorsally. The area between the right and first and second toes appears better. 2/1; The left foot wound is about the same. Erythema slightly better I gave him a week of doxycycline empirically Right foot wound is more extensive extending between the toes to the plantar surface Left lateral calf really no open surface on the inferior part of his original wound however the entire area still looks vulnerable Absolutely no improvement in the left buttock wound required debridement. 2/8; the left foot is about the same. Erythema is slightly improved I gave him clindamycin last week. Right foot looks better he is using Lotrimin and silver alginate He has a breakdown in the left lateral calf. Denuded epithelium  which I have removed Left buttock about the same were using Hydrofera Blue 2/15; left foot is about the same there is less surrounding erythema. Surface still has tightly adherent debris which I have debriding however not making any progress Right foot has a substantial wound on the medial right second toe between the first and second webspace. Still an open area on the left lateral calf distal area. Buttock wound is about the same 2/22; left foot is about the same less surrounding erythema. Surface has adherent debris. Polymen Ag Right foot area significant wound between the first and second toes. We have been using silver alginate here Left lateral leg polymen Ag at the base of his original venous insufficiency wound Left buttock some improvement here 3/1; Right foot is deteriorating in the first second toe webspace. Larger and more substantial. We have been using silver alginate. Left dorsal foot about the same markedly adherent surface debris using PolyMem Ag Left lateral calf surface debris using PolyMem AG Left buttock is improved again using PolyMem Ag. He is completing his terbinafine. The erythema in the foot seems better. He has been on this for 2 weeks 3/8; no improvement in any wound area in fact he has a small open area on the dorsal midfoot which is new this week. He has not gotten his foot x-rays  yet 3/15; his x-rays were both negative for osteomyelitis of both feet. No major change in any of his wounds on the extremities however his buttock wounds are better. We have been using polymen on the buttocks, left lower leg. Iodoflex on the left foot and silver alginate on the right 3/22; arrives in clinic today with the 2 major issues are the improvement in the left dorsal foot wound which for once actually looks healthy with a nice healthy wound surface without debridement. Using Iodoflex here. Unfortunately on the left lateral calf which is in the distal part of his original wound he  came to the clinic here for there was purulent drainage noted some increased breakdown scattered around the original area and a small area proximally. We we are using polymen here will change to silver alginate today. His buttock wound on the left is better and I think the area on the right first second toe webspace is also improved 3/29; left dorsal foot looks better. Using Iodoflex. Left ankle culture from deterioration last time grew E. coli, Enterobacter and Enterococcus. I will give him a course of cefdinir although that will not cover Enterococcus. The area on the right foot in the webspace of the first and second toe lateral first toe looks better. The area on his buttock is about healed Vascular appointment is on April 21. This is to look at his venous system vis--vis continued breakdown of the wounds on the left including the left lateral leg and left dorsal foot he. He has had previous ablations on this side 4/5; the area between the right first and second toes lateral aspect of the first toe looks better. Dorsal aspect of the left first toe on the left foot also improved. Unfortunately the left lateral lower leg is larger and there is a second satellite wound superiorly. The usual superficial abrasions on the left buttock overall better but certainly not closed 4/12; the area between the right first and second toes is improved. Dorsal aspect of the left foot also slightly smaller with a vibrant healthy looking surface. No real change in the left lateral leg and the left buttock wound is healed He has an unaffordable co-pay for Apligraf. Appointment with vein and vascular with regards to the left leg venous part of the circulation is on 4/21 4/19; we continue to see improvement in all wound areas. Although this is minor. He has his vascular appointment on 4/21. The area on the left buttock has not reopened although right in the center of this area the skin looks somewhat threatened 4/26;  the left buttock is unfortunately reopened. In general his left dorsal foot has a healthy surface and looks somewhat smaller although it was not measured as such. The area between his first and second toe webspace on the right as a small wound against the first toe. The patient saw vascular surgery. The real question I was asking was about the small saphenous vein on the left. He has previously ablated left greater saphenous vein. Nothing further was commented on on the left. Right greater saphenous vein without reflux at the saphenofemoral junction or proximal thigh there was no indication for ablation of the right greater saphenous vein duplex was negative for DVT bilaterally. They did not think there was anything from a vascular surgery point of view that could be offered. They ABIs within normal limits 5/3; only small open area on the left buttock. The area on the left lateral leg which was his original venous  reflux is now 2 wounds both which look clean. We are using Iodoflex on the left dorsal foot which looks healthy and smaller. He is down to a very tiny area between the right first and second toes, using silver alginate 5/10; all of his wounds appear better. We have much better edema control in 4 layer compression on the left. This may be the factor that is allowing the left foot and left lateral calf to heal. He has external compression garments at home 04/14/20-All of his wounds are progressing well, the left forefoot is practically closed, left ischium appears to be about the same, right toe webspace is also smaller. The left lateral leg is about the same, continue using Hydrofera Blue to this, silver alginate to the ischium, Iodoflex to the toe space on the right 6/7; most of his wounds outside of the left buttock are doing well. The area on the left lateral calf and left dorsal foot are smaller. The area on the right foot in between the first and second toe webspace is barely visible  although he still says there is some drainage here is the only reason I did not heal this out. Unfortunately the area on the left buttock almost looks like he has a skin tear from tape. He has open wound and then a large flap of skin that we are trying to get adherence over an area just next to the remaining wound 6/21; 2 week follow-up. I believe is been here for nurse visits. Miraculously the area between his first and second toes on the left dorsal foot is closed over. Still open on the right first second web space. The left lateral calf has 2 open areas. Distally this is more superficial. The proximal area had a little more depth and required debridement of adherent necrotic material. His buttock wound is actually larger we have been using silver alginate here 6/28; the patient's area on the left foot remains closed. Still open wet area between the first and second toes on the right and also extending into the plantar aspect. We have been using silver alginate in this location. He has 2 areas on the left lower leg part of his original long wounds which I think are better. We have been using Hydrofera Blue here. Hydrofera Blue to the left buttock which is stable 7/12; left foot remains closed. Left ankle is closed. May be a small area between his right first and second toes the only truly open area is on the left buttock. We have been using Hydrofera Blue here 7/19; patient arrives with marked deterioration especially in the left foot and ankle. We did not put him in a compression wrap on the left last week in fact he wore his juxta lite stockings on either side although he does not have an underlying stocking. He has a reopening on the left dorsal foot, left lateral ankle and a new area on the right dorsal ankle. More worrisome is the degree of erythema on the left foot extending on the lateral foot into the lateral lower leg on the left 7/26; the patient had erythema and drainage from the lateral  left ankle last week. Culture of this grew MRSA resistant to doxycycline and clindamycin which are the 2 antibiotics we usually use with this patient who has multiple antibiotic allergies including linezolid, trimethoprim sulfamethoxazole. I had give him an empiric doxycycline and he comes in the area certainly looks somewhat better although it is blotchy in his lower leg. He  has not been systemically unwell. He has had areas on the left dorsal foot which is a reopening, chronic wounds on the left lateral ankle. Both of these I think are secondary to chronic venous insufficiency. The area between his first and second toes is closed as far as I can tell. He had a new wrap injury on the right dorsal ankle last week. Finally he has an area on the left buttock. We have been using silver alginate to everything except the left buttock we are using Hydrofera Blue 06/30/20-Patient returns at 1 week, has been given a sample dose pack of NUZYRA which is a tetracycline derivative [omadacycline], patient has completed those, we have been using silver alginate to almost all the wounds except the left ischium where we are using Hydrofera Blue all of them look better 8/16; since I last saw the patient he has been doing well. The area on the left buttock, left lateral ankle and left foot are all closed today. He has completed the [place] I gave him last time and tolerated this well. He still has open areas on the right dorsal ankle and in the right first second toe area which we are using silver alginate. 8/23; we put him in his bilateral external compression stockings last week as he did not have anything open on either leg except for concerning area between the right first and second toe. He comes in today with an area on the left dorsal foot slightly more proximal than the original wound, the left lateral foot but this is actually a continuation of the area he had on the left lateral ankle from last time. As well he is  opened up on the left buttock again. Keimig, ELLEN MAYOL ([id number]) [id number] Page 14 of 39 8/30; comes in today with things looking a lot better. The area on the left lower ankle has closed down as has the left foot but with eschar in both areas. The area on the dorsal right ankle is also epithelialized. Very little remaining of the left buttock wound. We have been using silver alginate on all wound areas 9/13; the area in the first second toe webspace on the right has fully epithelialized. He still has some vulnerable epithelium on the right and the ankle and the dorsal foot. He notes weeping. He is using his juxta lite stocking. On the left again the left dorsal foot is closed left lateral ankle is closed. We went to the juxta lite stocking here as well. Still vulnerable in the left buttock although only 2 small open areas remain here 9/27; 2-week follow-up. We did not look at his left leg but the patient says everything is closed. He is a bit disturbed by the amount of edema in his left foot he is using juxta lite stockings but asking about over the toes stockings which would be 30/40, will talk to him next time. According to him there is no open wound on either the left foot or the left ankle/calf He has an open area on the dorsal right calf which I initially point a wrap injury. He has superficial remaining wound on the left ischial tuberosity been using silver alginate although he says this sticks to the wound 10/5; we gave him 2-week follow-up but he called yesterday expressing some concerns about his right foot right ankle and the left buttock. He came in early. There is still no open areas on the left leg and that still in his juxta lite stocking 10/11; he  only has 1 small area on the left buttock that remains measuring millimeters 1 mm. Still has the same irritated skin in this area. We recommended zinc oxide when this eventually closes and pressure relief is  meticulously is he can do this. He still has an area on the dorsal part of his right first through third toes which is a bit irritated and still open and on the dorsal ankle near the crease of the ankle. We have been using silver alginate and using his own stocking. He has nothing open on the left leg or foot 10/25; 2-week follow-up. Not nearly as good on the left buttock as I was hoping. For open areas with 5 looking threatened small. He has the erythematous irritated chronic skin in this area. 1 area on the right dorsal ankle. He reports this area bleeds easily Right dorsal foot just proximal to the base of his toes We have been using silver alginate. 11/8; 2-week follow-up. Left buttock is about the same although I do not think the wounds are in the same location we have been using silver alginate. I have asked him to use zinc oxide on the skin around the wounds. He still has a small area on the right dorsal ankle he reports this bleeds easily Right dorsal foot just proximal to the base of the toes does not have anything open although the skin is very dry and scaly He has a new opening on the nailbed of the left great toe. Nothing on the left ankle 11/29; 3-week follow-up. Left buttock has 2 open areas. And washing of these wounds today started bleeding easily. Suggesting very friable tissue. We have been using silver alginate. Right dorsal ankle which I thought was initially a wrap injury we have been using silver alginate. Nothing open between the toes that I can see. He states the area on the left dorsal toe nailbed healed after the last visit in 2 or 3 days 12/13; 3-week follow-up. His left buttock now has 3 open areas but the original 2 areas are smaller using polymen here. Surrounding skin looks better. The right dorsal ankle is closed. He has a small opening on the right dorsal foot at the level of the third toe. In general the skin looks better here. He is wearing his juxta lite stocking  on the left leg says there is nothing open 11/24/2020; 3 weeks follow-up. His left buttock still has the 3 open areas. We have been using polymen but due to lack of response he changed to [facility] area. Surrounding skin is dry erythematous and irritated looking. There is no evidence of infection either bacterial or fungal however there is loss of surface epithelium He still has very dry skin in his foot causing irritation and erythema on the dorsal part of his toes. This is not responded to prolonged courses of antifungal simply looks dry and irritated 1/24; left buttock area still looks about the same he was unable to find the triad ointment that we had suggested. The area on the right lower leg just above the dorsal ankle has reopened and the areas on the right foot between the first second and second third toes and scaling on the bottom of the foot has been about the same for quite some time now. been using silver alginate to all wound areas 2/7; left buttock wound looked quite good although not much smaller in terms of surface area surrounding skin looks better. Only a few dry flaking areas on the  right foot in between the first and second toes the skin generally looks better here [ammonium lactate]. Finally the area on the right dorsal ankle is closed 2/21; There is no open area on the right foot even between the right first and second toe. Skin around this area dorsally and plantar aspects look better. He has a reopening of the area on the right ankle just above the crease of the ankle dorsally. I continue to think that this is probably friction from spasms may be even this time with his stocking under the compression stockings. Wounds on his left buttock look about the same there a couple of areas that have reopened. He has a total square area of loss of epithelialization. This does not look like infection it looks like a contact dermatitis but I just cannot determine to what 3/14; there  is nothing on the right foot between the first and second toes this was carefully inspected under illumination. Some chronic irritation on the dorsal part of his foot from toes 1-3 at the base. Nothing really open here substantially. Still has an area on the right foot/ankle that is actually larger and hyper granulated. His buttock area on the left is just about closed however he has chronic inflammation with loss of the surface epithelial layer 3/28; 2-week follow-up. In clinic today with a new wound on the left anterior mid tibia. Says this happened about 2 weeks ago. He is not really sure how wonders about the spasticity of his legs at night whether that could have caused this other than that he does not have a good idea. He has been using topical antibiotics and silver alginate. The area on his right dorsal ankle seems somewhat better. Finally everything on his left buttock is closed. 4/11; 2-week follow-up. All of his wounds are better except for the area over the ischium and left buttock which have opened up widely again. At least part of this is covered in necrotic fibrinous material another part had rolled nonviable skin. The area on the right ankle, left anterior mid tibia are both a lot better. He had no open wounds on either foot including the areas between the first and second toes 4/25; patient presents for 2-week follow-up. He states that the wounds are overall stable. He has no complaints today and states he is using Hydrofera Blue to open wounds. 5/9; have not seen this man in over a month. For my memory he has open areas on the left mid tibia and right ankle. T oday he has new open area on the right dorsal foot which we have not had a problem with recently. He has the sustained area on the left buttock He is also changed his insurance at the beginning of the year [employer]. We will need prior authorizations for debridement 5/23; patient presents for 2-week follow-up. He has prior  authorizations for debridement. He denies any issues in the past 2 weeks with his wound care. He has been using Hydrofera Blue to all the wounds. He does report a circular rash to the upper left leg that is new. He denies acute signs of infection. 6/6; 2-week follow-up. The patient has open wounds on the left buttock which are worse than the last time I saw this about a month ago. He also has a new area to me on the left anterior mid tibia with some surrounding erythema. The area on the dorsal ankle on the right is closed but I think this will be a  friction injury every time this area is exposed to either our wraps or his compression stockings caused by unrelenting spasms in this leg. 6/20; 2-week follow-up. The patient has open wounds on the left buttock which is about the same. Using [facility] here. - The left mid tibia has a static amount of surrounding erythema. Also a raised area in the center. We have been using Hydrofera Blue here. Finally he has broken down in his dorsal right foot extending between the first and second toes and going to the base of the first and second toe webspace. I have previously assumed that this was severe venous hypertension 7/5; 2-week follow-up The left buttock wound actually looks better. We are using Hydrofera Blue. He has extensive skin irritation around this area and I have not really been able to get that any better. I have tried Lotrisone i.e. antifungals and steroids. More most recently we have just been using Coloplast really looks about the same. The left mid tibia which was new last week culture to have very resistant staph aureus. Not only methicillin-resistant but doxycycline resistant. The patient has a plethora of antibiotic allergies including sulfa, linezolid. I used topical bacitracin on this but he has not started this yet. In addition he has an expanding area of erythema with a wound on the dorsal right foot. I did a deep tissue culture of  this area today 7/12; Left buttock area actually looks better surrounding skin also looks less irritated. Left mid tibia looks about the same. He is using bacitracin this is not worse Right dorsal foot looks about the same as well. The left first toe also looks about the same Menz, Quame E ([id number]) [phone].pdf Page 15 of 39 7/19; left buttock wound continues to improve in terms of open areas Left mid tibia is still concerning amount of swelling he is using bacitracin Dorsal left first toe somewhat smaller Right dorsal foot somewhat smaller 7/25; left buttock wound actually continues to improve Left mid tibia area has less swelling. I gave him all my samples of new Nuzyra. This seems to have helped although the wound is still open it. His abrasion closed by here Left dorsal great toe really no better. Still a very nonviable surface Right dorsal foot perhaps some better. We have been using bacitracin and silver nitrate to the areas on his lower legs and Hydrofera Blue to the area on the buttock. 8/16 Disappointed that his left buttock wound is actually more substantial. Apparently during the last nurse visit these were both very small. He has continued irritation to a large area of skin on his buttock. I have never been able to totally explain this although I think it some combination of the way he sits, pressure, moisture. He is not incontinent enough to contribute to this. Left dorsal great toe still fibrinous debris on the surface that I have debrided today Large area across the dorsal right toes. The area on the left anterior mid tibia has less swelling. He completed the [place]. This does not look infected although the tissue is still fried 8/30; 2-week follow-up. Left buttock areas not improved. We used Hydrofera Blue on this. Weeping wet with the surrounding erythema that I have not been able to control even with Lotrisone and topical Coloplast Left  dorsal great toe looks about the same More substantial area again at the base of his toes on the left which is new this week. Area across the dorsal right toes looks improved The left anterior  mid tibia looks like it is trying to close 9/13; 2-week follow-up. Using silver alginate on all of his wounds. The left dorsal foot does not look any better. He has the area on the dorsal toe and also the areas at the base of all of his toes 1 through 3. On the right foot he has a similar pattern in a similar area. He has the area on his left mid tibia that looks fairly healthy. Finally the area on his left buttock looks somewhat bette 9/20; culture I did of the left foot which was a deep tissue culture last time showed E. coli he has erythema around this wound. Still a completely necrotic surface. His right dorsal foot looks about the same. He has a very friable surface to the left anterior mid tibia. Both buttock wounds look better. We have been using silver alginate to all wounds 10/4; he has completed the cephalexin that I gave him last time for the left foot. He is using topical gentamicin under silver alginate silver alginate being applied to all the wounds. Unfortunately all the wounds look irritated on his dorsal right foot dorsal left foot left mid tibia. I wonder if this could be a silver allergy. I am going to change him to [facility] on the lower extremity. The skin on the left buttock and left posterior thigh still flaking dry and irritated. This has continued no matter what I have applied topically to this. He has a solitary open wound which by itself does not look too bad however the entire area of surrounding skin does not change no matter what we have applied here 10/18; the area on the left dorsal foot and right dorsal foot both look better. The area on the right extends into the plantar but not between his toes. We have been using silver alginate. He still has a rectangular erythematous  area around the area on the left tibia. The wound itself is very small. Finally everything on his left buttock looks a little larger the skin is erythematous 11/15; patient comes in with the left dorsal and right dorsal foot distally looking somewhat better. Still nonviable surface on the left foot which required debridement. He still has the area on the left anterior mid tibia although this looks somewhat better. He has a new area on the right lateral lower leg just above the ankle. Finally his left buttock looks terrible with multiple superficial open wounds geometric square shaped area of chronic erythema which I have not been able to sort through 11/29; right dorsal foot and left dorsal foot both look somewhat better. No debridement required. He has the fragile area on the left anterior mid tibia this looks and continues to look somewhat better. Right lateral lower leg just above the ankle we identified last time also looks better. In general the area on his left buttock looks improved. We are using Hydrofera Blue to all wound areas 12/13; right dorsal foot looks better. The area on the right lateral leg is healed. Left dorsal foot has 2 open areas both of which require debridement. The fragile area on the left anterior mid tibial looks better. Smaller area on his buttocks. Were using Hydrofera Blue 1/10; patient comes in with everything looking slightly larger and/or worse. This includes his left buttock, reopening of the left mid tibia, larger areas on the left dorsal foot and what looks to be a cellulitis on the right dorsal and plantar foot. We have been using Hydrofera Blue on  all wounds. 1/17; right dorsal foot distally looks better today. The left foot has 2 open wounds that are about the same surrounding erythema. 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  No malignancy they did not stain for fungus 1/24; his right distal foot is not closed dry and scaly but the wound looks like it is contracting. I did not debride anything here. Problem on his left dorsal foot with expanding erythema. Apparently there were problems last week getting the Riki Altes however it is now available at the [employer] [facility] but a week later. He is using ketoconazole and Coloplast to the left buttock along with Hydrofera Blue this actually looks quite a bit better today. 1/31; right dorsal foot again is dry and scaly but looking to contract. He has been using a moisturizer on his feet at my request but he is not sure which 1. The left dorsal foot wounds look about the same there is erythema here that I marked last week however after course of Nuzyra it certainly is not any better but not any worse either. Finally on his left buttock the skin continues to look better he has the original wound but a new substantial area towards the gluteal cleft. Almost like a skin tear. I used scissors to remove skin and subcutaneous tissue here silver nitrate and direct pressure 2/7; right dorsal foot. This does not look too much different from last week. Some erythema skin dry and scaly. No debridement. Left dorsal great toe again still not much improvement. I did remove flaking dry skin and callus from around the edge. Finally on his left buttock. The skin is somewhat better in the periwound. Surface wounds are superficial somewhat better than last week. 01/26/2022: Is a little bit of a mystery as to why his wounds fail to respond to treatments and actually seem to get worse. This is my first encounter with this patient. He was previously followed by Dr. Leanord Hawking. Based upon my review of the chart, it seems that there is a little bit of a mystery as to why his wounds do not respond as anticipated to the interventions applied and sometimes even get worse. Biopsies have been performed and he was  seen by dermatology in [place], but that did not shed any light on the matter. T oday, his gluteal wound is larger, with substantial drainage, rather malodorous. The food wounds are not terrible, but he has a lot of callus and scaly skin around these. He is currently getting silver alginate on the gluteal wound, with idodoflex to the feet. He is using lotrisone on his legs for the dry, scaly skin. 02/09/2022: There has really been no change to any of his wounds. The gluteal wound less drainage and odor, but remains about the same size, the periwound skin remains oddly scaly. His lower extremity wounds also appear roughly the same size. They continue to accumulate a small amount of slough. The periwound on his feet and ankle wounds has dry eschar and loose dead skin. We have been using silver alginate on the gluteal wound and Iodoflex on his feet and ankle wounds. T the periwound around his gluteal lesion and Lotrisone on his feet and legs. o 02/23/2022: The right plantar foot wound is closed. The gluteal site looks small but has continued to produce hypertrophic granulation tissue. The foot wounds all look about the same on the dorsal surface  of the right foot; on the left, there is only a small open area at the site of where his left great toenail would have been. 03/16/2022: The right ankle wound is healed. The right dorsal foot wound is about the same. The left dorsal foot wound is quite a bit smaller and the ischial wound is nearly closed. Lehmkuhl, DAWON TROOP ([id number]) [id number] Page 16 of 39 03/30/2022: The right ankle wound reopened. Both dorsal foot wounds are quite a bit smaller. Unfortunately, he appears to have sheared part of his ischial wound open further, perhaps during a transfer. 04/13/2022: The right ankle wound has hypertrophic granulation tissue present. The dorsal foot wounds continue to decrease in size. The ischial wound looks about the same today, no  better, no worse. 04/27/2022: The right ankle wound has closed. Unfortunately, it looks like some moisture got underneath the dry skin on both of his dorsal feet and these wounds have expanded in size. The ischial wound remains the same with perhaps a little bit more slough accumulation than at our previous visit. 05/11/2022: The right ankle wound remains closed. There is a left anterior tibial wound that is small has patchy openings with accumulated slough. The dorsum of his right foot appears to be nearly healed with just a small punctate opening. The plantar surface of his right foot has a new opening that looks like he may have picked some skin there. His sacral ulcer has hypertrophic granulation tissue but has some slough accumulation. The dorsum of his left foot has multiple open areas in a fairly ragged distribution. All of these have slough accumulated within them. 06/01/2022: The right ankle and left anterior tibial wound are both closed. Dorsum of his right foot and left foot both look substantially better with just tiny scattered openings The without any slough accumulation. He has sheared open new areas on his left gluteus and ischium. He says that his wheelchair cushion, which is air-filled, has a leak and so it keeps deflating. He is awaiting a new cushion. 06/15/2022: The right ankle wound has reopened and the fat layer is exposed. Both dorsal feet have just small openings with just a little bit of slough and eschar accumulation. The wound on his left gluteus and ischium is larger again today and has a foul odor. 06/29/2022: The right ankle wound has hypertrophic granulation tissue buildup. His dorsal foot wounds both look better with just some eschar on the surface. He has a new wound on his left lateral ankle. He is not sure how he acquired it but by appearance, it looks that he hit it on something, potentially his wheelchair or bed. The ischial wound is about the same but is cleaner without  any significant purulent drainage or odor. He did not understand what the [facility] call was about and therefore he does not have the topical compounded antibiotic. 07/13/2022: The right ankle wound again has hypertrophic granulation tissue, but less so than at his previous visit. The ischial wound has improved tremendously the use of the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 is a raised area with an ulcer in the middle. The patient and apparently his wife have no history to this. This may need to be biopsied. He has the arterial and venous studies we ordered last week ordered for March 01/16/16; the patient's 2 wounds on his right leg on the anterior leg and Achilles area are both healed. He continues to have a deep wound with very adherent necrotic eschar and slough on the lateral aspect of his left leg in 2 areas and also raised area over the left Achilles. We put Santyl on this last week and left him in a rapid. He says the drainage went through. He has some Kerlix Coban and in some Profore at home I have therefore written him a prescription for Santyl and he can change this at  home on his own. 01/23/16; the original 2 wounds on the right leg are apparently still closed. He continues to have a deep wound on his left lateral leg in 2 spots the superior one much larger than the inferior one. He also has a raised area on the left Achilles. We have been putting Santyl and all of these wounds. His wife is changing this at home one time this week although she may be able to do this more frequently. 01/30/16 no open wounds on the right leg. He continues to have a deep wound on the left lateral leg in 2 spots and a smaller wound over the left Achilles area. [name] This debridement is with great difficulty. [name] 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 In spite of that his leg actually looks somewhat better. There is still copious drainage and some erythema but it is generally better. The oral options that were obvious including Zyvox and sulfonamides he has rash issues both of these. This is sensitive to rifampin but this is not usually used along gentamicin but this is parenteral and again not used along. The obvious alternative is vancomycin. He has had his arterial studies. [name] T brachial index was 1.3 oe on the right. His waveforms were biphasic bilaterally. Doppler waveforms of the digit were normal in the right damp and on the left. Comment that this could've been due to extreme edema. His venous studies show reflux on both sides in the femoral popliteal veins as well as the greater and lesser saphenous veins bilaterally. Ultimately he is going to need to see vascular surgery about this issue. Hopefully when we can get his wounds and a  little better shape. 02/19/16; the patient was able to complete a course of Delavan's for MRSA in the face of multiple antibiotic allergies. Arterial studies showed an ABI of  him 0.88 on the right 1.17 on the left the. Waveforms were biphasic at the posterior tibial and dorsalis pedis digital waveforms were normal. Right toe brachial index was 1.3 limited by shaking and edema. His venous study showed widespread reflux in the left at the common femoral vein the greater and lesser saphenous vein the greater and lesser saphenous vein on the right as well as the popliteal and femoral vein. The popliteal and femoral vein on the left did not show reflux. His wounds on the right leg give healed on the left he is still using Santyl. 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 The erythema has not really resolved and I wonder if this is mostly venous inflammation rather than cellulitis. Still using Santyl. He is approved for Apligraf 03/04/16; there is less erythema around the wound. Both wounds require aggressive surgical debridement. Not yet ready for Apligraf 03/11/16; aggressive debridement again. Not ready for Apligraf 03/18/16 aggressive debridement again. 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 Has been to see vascular surgery he is being planned for a venous ablation 03/25/16; aggressive debridement again of both wound areas on the left lateral leg. He is due for ablation surgery on May 22. He is much closer to being ready for an Apligraf. Has a new area between the left first and second toes 04/01/16 aggressive debridement done of both wounds. The new wound at the base of between his second and first toes looks stable 04/08/16; continued aggressive debridement of both wounds on the left lower leg. He goes for his venous ablation on Monday. The new wound at the base of his first and second toes dorsally appears stable. 04/15/16; wounds  aggressively debridement although the base of this looks considerably better Apligraf #1. He had ablation surgery on Monday I'll need to research these records. We only have approval for four Apligraf's 04/22/16; the patient is here for a wound check [Apligraf last week] intake nurse concerned about erythema around the wounds. Apparently a significant degree of drainage. The patient has chronic venous inflammation which I think accounts for most of this however I was asked to look at this today 04/26/16; the patient came back for check of possible cellulitis in his left foot however the Apligraf dressing was inadvertently removed therefore we elected to prep the wound for a second Apligraf. I put him on doxycycline on 6/1 the erythema in the foot 05/03/16 we did not remove the dressing from the superior wound as this is where I put all of his last Apligraf. Surface debridement done with a curette of the lower wound which looks very healthy. The area on the left foot also looks quite satisfactory at the dorsal artery at the first and second toes 05/10/16; continue Apligraf to this. Her wound, Hydrafera to the lower wound. He has a new area on the right second toe. Left dorsal foot firstsecond toe also looks improved 05/24/16; wound dimensions must be smaller I was able to use Apligraf to all 3 remaining wound areas. 06/07/16 patient's last Apligraf was 2 weeks ago. He arrives today with the 2 wounds on his lateral left leg joined together. This would have to be seen as a negative. He also has a small wound in his first and second toe on the left dorsally with quite a bit of surrounding erythema in the first second and third toes. This looks to be infected or inflamed, very difficult clinical call. 06/21/16: lateral left leg  combined wounds. Adherent surface slough area on the left dorsal foot at roughly the fourth toe looks improved 07/12/16; he now has a single linear wound on the lateral left leg. This does not  look to be a lot changed from when I lost saw this. The area on his dorsal left foot looks considerably better however. 08/02/16; no major change in the substantial area on his left lateral leg since last time. We have been using Hydrofera Blue for a prolonged period of time now. The area on his left foot is also unchanged from last review 07/19/16; the area on his dorsal foot on the left looks considerably smaller. He is beginning to have significant rims of epithelialization on the lateral left leg Benninger, Arad E ([id number]) [id number] Page 23 of 39 wound. This also looks better. 08/05/16; the patient came in for a nurse visit today. Apparently the area on his left lateral leg looks better and it was wrapped. However in general discussion the patient noted a new area on the dorsal aspect of his right second toe. The exact etiology of this is unclear but likely relates to pressure. 08/09/16 really the area on the left lateral leg did not really look that healthy today perhaps slightly larger and measurements. The area on his dorsal right second toe is improved also the left foot wound looks stable to improved 08/16/16; the area on the last lateral leg did not change any of dimensions. Post debridement with a curet the area looked better. Left foot wound improved and the area on the dorsal right second toe is improved 08/23/16; the area on the left lateral leg may be slightly smaller both in terms of length and width. Aggressive debridement with a curette afterwards the tissue appears healthier. Left foot wound appears improved in the area on the dorsal right second toe is improved 08/30/16 patient developed a fever over the weekend and was seen in an urgent care. Felt to have a UTI and put on doxycycline. He has been since changed over the phone to [facility]. After we took off the wrap on his right leg today the leg is swollen warm and erythematous, probably more likely the  source of the fever 09/06/16; have been using collagen to the major left leg wound, silver alginate to the area on his anterior foot/toes 09/13/16; the areas on his anterior foot/toes on both sides appear to be virtually closed. Extensive wound on the left lateral leg perhaps slightly narrower but each visit still covered an adherent surface slough 09/16/16 patient was in for his usual Thursday nurse visit however the intake nurse noted significant erythema of his dorsal right foot. He is also running a low- grade fever and having increasing spasms in the right leg 09/20/16 here for cellulitis involving his right great toes and forefoot. This is a lot better. Still requiring debridement on his left lateral leg. Santyl direct says he needs prior authorization. Therefore his wife cannot change this at home 09/30/16; the patient's extensive area on the left lateral calf and ankle perhaps somewhat better. Using Santyl. The area on the left toes is healed and I think the area on his right dorsal foot is healed as well. There is no cellulitis or venous inflammation involving the right leg. He is going to need compression stockings here. 10/07/16; the patient's extensive wound on the left lateral calf and ankle does not measure any differently however there appears to be less adherent surface slough using Santyl and aggressive weekly debridements  10/21/16; no major change in the area on the left lateral calf. Still the same measurement still very difficult to debridement adherent slough and nonviable subcutaneous tissue. This is not really been helped by several weeks of Santyl. Previously for 2 weeks I used Iodoflex for a short period. A prolonged course of Hydrofera Blue didn't really help. I'm not sure why I only used 2 weeks of Iodoflex on this there is no evidence of surrounding infection. He has a small area on the right second toe which looks as though it's progressing towards closure 10/28/16; the  wounds on his toes appear to be closed. No major change in the left lateral leg wound although the surface looks somewhat better using Iodoflex. He has had previous arterial studies that were normal. He has had reflux studies and is status post ablation although I don't have any exact notes on which vein was ablated. I'll need to check the surgical record 11/04/16; he's had a reopening between the first and second toe on the left and right. No major change in the left lateral leg wound. There is what appears to be cellulitis of the left dorsal foot 11/18/16 the patient was hospitalized initially in [place] and then subsequently transferred to [facility] long and was admitted there from 11/09/16 through 11/12/16. He had developed progressive cellulitis on the right leg in spite of the doxycycline I gave him. I'd spoken to the hospitalist in [place] who was concerned about continuing leukocytosis. CT scan is what I suggested this was done which showed soft tissue swelling without evidence of osteomyelitis or an underlying abscess blood cultures were negative. At [facility] he was treated with vancomycin and Primaxin and then add an infectious disease consult. He was transitioned to Ceftaroline. He has been making progressive improvement. Overall a severe cellulitis of the right leg. He is been using silver alginate to her original wound on the left leg. The wounds in his toes on the right are closed there is a small open area on the base of the left second toe 11/26/15; the patient's right leg is much better although there is still some edema here this could be reminiscent from his severe cellulitis likely on top of some degree of lymphedema. His left anterior leg wound has less surface slough as reported by her intake nurse. Small wound at the base of the left second toe 12/02/16; patient's right leg is better and there is no open wound here. His left anterior lateral leg wound continues to have a  healthy-looking surface. Small wound at the base of the left second toe however there is erythema in the left forefoot which is worrisome 12/16/16; is no open wounds on his right leg. We took measurements for stockings. His left anterior lateral leg wound continues to have a healthy-looking surface. I'm not sure where we were with the Apligraf run through his insurance. We have been using Iodoflex. He has a thick eschar on the left first second toe interface, I suspect this may be fungal however there is no visible open 12/23/16; no open wound on his right leg. He has 2 small areas left of the linear wound that was remaining last week. We have been using Prisma, I thought I have disclosed this week, we can only look forward to next week 01/03/17; the patient had concerning areas of erythema last week, already on doxycycline for UTI through his primary doctor. The erythema is absolutely no better there is warmth and swelling both medially from the  left lateral leg wound and also the dorsal left foot. 01/06/17- Patient is here for follow-up evaluation of his left lateral leg ulcer and bilateral feet ulcers. He is on oral antibiotic therapy, tolerating that. Nursing staff and the patient states that the erythema is improved from Monday. 01/13/17; the predominant left lateral leg wound continues to be problematic. I had put Apligraf on him earlier this month once. However he subsequently developed what appeared to be an intense cellulitis around the left lateral leg wound. I gave him Dalvance I think on 2/12 perhaps 2/13 he continues on cefdinir. The erythema is still present but the warmth and swelling is improved. I am hopeful that the cellulitis part of this control. I wouldn't be surprised if there is an element of venous inflammation as well. 01/17/17. The erythema is present but better in the left leg. His left lateral leg wound still does not have a viable surface buttons certain parts of this long  thin wound it appears like there has been improvement in dimensions. 01/20/17; the erythema still present but much better in the left leg. I'm thinking this is his usual degree of chronic venous inflammation. The wound on the left leg looks somewhat better. Is less surface slough 01/27/17; erythema is back to the chronic venous inflammation. The wound on the left leg is somewhat better. I am back to the point where I like to try an Apligraf once again 02/10/17; slight improvement in wound dimensions. Apligraf #2. He is completing his doxycycline 02/14/17; patient arrives today having completed doxycycline last Thursday. This was supposed to be a nurse visit however once again he hasn't tense erythema from the medial part of his wound extending over the lower leg. Also erythema in his foot this is roughly in the same distribution as last time. He has baseline chronic venous inflammation however this is a lot worse than the baseline I have learned to accept the on him is baseline inflammation 02/24/17- patient is here for follow-up evaluation. He is tolerating compression therapy. His voicing no complaints or concerns he is here anticipating an Apligraf 03/03/17; he arrives today with an adherent necrotic surface. I don't think this is surface is going to be amenable for Apligraf's. The erythema around his wound and on the left dorsal foot has resolved he is off antibiotics 03/10/17; better-looking surface today. I don't think he can tolerate Apligraf's. He tells me he had a wound VAC after a skin graft years ago to this area and they had difficulty with a seal. The erythema continues to be stable around this some degree of chronic venous inflammation but he also has recurrent cellulitis. We have been using Iodoflex 03/17/17; continued improvement in the surface and may be small changes in dimensions. Using Iodoflex which seems the only thing that will control his surface 03/24/17- He is here for follow up  evaluation of his LLE lateral ulceration and ulcer to right dorsal foot/toe space. He is voicing no complaints or concerns, He is tolerating compression wrap. 03/31/17 arrives today with a much healthier looking wound on the left lower extremity. We have been using Iodoflex for a prolonged period of time which has for the first time prepared and adequate looking wound bed although we have not had much in the way of wound dimension improvement. He also has a small wound between the first and second toe on the right 04/07/17; arrives today with a healthy-looking wound bed and at least the top 50% of this wound appears  to be now her. No debridement was required I have changed him to [facility] last week after prolonged Iodoflex. He did not do well with Apligraf's. We've had a re-opening between the first and second toe on the right 04/14/17; arrives today with a healthier looking wound bed contractions and the top 50% of this wound and some on the lesser 50%. Wound bed appears healthy. The area between the first and second toe on the right still remains problematic 04/21/17; continued very gradual improvement. Using [facility] 04/28/17; continued very gradual improvement in the left lateral leg venous insufficiency wound. His periwound erythema is very mild. We have been using Hydrofera Blue. Wound is making progress especially in the superior 50% 05/05/17; he continues to have very gradual improvement in the left lateral venous insufficiency wound. Both in terms with an length rings are improving. I Edelstein, Keyshawn E ([id number]) [id number] Page 24 of 39 debrided this every 2 weeks with #5 curet and we have been using Hydrofera Blue and again making good progress With regards to the wounds between his right first and second toe which I thought might of been tinea pedis he is not making as much progress very dry scaly skin over the area. Also the area at the base of the left  first and second toe in a similar condition 05/12/17; continued gradual improvement in the refractory left lateral venous insufficiency wound on the left. Dimension smaller. Surface still requiring debridement using Hydrofera Blue 05/19/17; continued gradual improvement in the refractory left lateral venous ulceration. Careful inspection of the wound bed underlying rumination suggested some degree of epithelialization over the surface no debridement indicated. Continue Hydrofera Blue difficult areas between his toes first and third on the left than first and second on the right. I'm going to change to silver alginate from silver collagen. Continue ketoconazole as I suspect underlying tinea pedis 05/26/17; left lateral leg venous insufficiency wound. We've been using Hydrofera Blue. I believe that there is expanding epithelialization over the surface of the wound albeit not coming from the wound circumference. This is a bit of an odd situation in which the epithelialization seems to be coming from the surface of the wound rather than in the exact circumference. There is still small open areas mostly along the lateral margin of the wound. He has unchanged areas between the left first and second and the right first second toes which I been treating for tenia pedis 06/02/17; left lateral leg venous insufficiency wound. We have been using Hydrofera Blue. Somewhat smaller from the wound circumference. The surface of the wound remains a bit on it almost epithelialized sedation in appearance. I use an open curette today debridement in the surface of all of this especially the edges Small open wounds remaining on the dorsal right first and second toe interspace and the plantar left first second toe and her face on the left 06/09/17; wound on the left lateral leg continues to be smaller but very gradual and very dry surface using Hydrofera Blue 06/16/17 requires weekly debridements now on the left lateral leg although  this continues to contract. I changed to silver collagen last week because of dryness of the wound bed. Using Iodoflex to the areas on his first and second toes/web space bilaterally 06/24/17; patient with history of paraplegia also chronic venous insufficiency with lymphedema. Has a very difficult wound on the left lateral leg. This has been gradually reducing in terms of with but comes in with a very dry adherent surface.  High switch to silver collagen a week or so ago with hydrogel to keep the area moist. This is been refractory to multiple dressing attempts. He also has areas in his first and second toes bilaterally in the anterior and posterior web space. I had been using Iodoflex here after a prolonged course of silver alginate with ketoconazole was ineffective [question tinea pedis] 07/14/17; patient arrives today with a very difficult adherent material over his left lateral lower leg wound. He also has surrounding erythema and poorly controlled edema. He was switched his Santyl last visit which the nurses are applying once during his doctor visit and once on a nurse visit. He was also reduced to 2 layer compression I'm not exactly sure of the issue here. 07/21/17; better surface today after 1 week of Iodoflex. Significant cellulitis that we treated last week also better. [Doxycycline] 07/28/17 better surface today with now 2 weeks of Iodoflex. Significant cellulitis treated with doxycycline. He has now completed the doxycycline and he is back to his usual degree of chronic venous inflammation/stasis dermatitis. He reminds me he has had ablations surgery here 08/04/17; continued improvement with Iodoflex to the left lateral leg wound in terms of the surface of the wound although the dimensions are better. 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 Problematic areas on the plantar aspect of the first second toe web space on the left  and the dorsal aspect of the first second toe web space on the right. At one point I felt these were probably related to chronic fungal infections in treated him aggressively for this although we have not made any improvement here. 08/11/17; left lateral leg. Surface continues to improve with the Iodoflex although we are not seeing much improvement in overall wound dimensions. Areas on his plantar left foot and right foot show no improvement. In fact the right foot looks somewhat worse 08/18/17; left lateral leg. We changed to [facility] Blue last week after a prolonged course of Iodoflex which helps get the surface better. It appears that the wound with is improved. Continue with difficult areas on the left dorsal first second and plantar first second on the right 09/01/17; patient arrives in clinic today having had a temperature of 103 yesterday. He was seen in the ER and [facility]. The patient was concerned he could have cellulitis again in the right leg however they diagnosed him with a UTI and he is now on Keflex. He has a history of cellulitis which is been recurrent and difficult but this is been in the left leg, in the past 5 use doxycycline. He does in and out catheterizations at home which are risk factors for UTI 09/08/17; patient will be completing his Keflex this weekend. The erythema on the left leg is considerably better. He has a new wound today on the medial part of the right leg small superficial almost looks like a skin tear. He has worsening of the area on the right dorsal first and second toe. His major area on the left lateral leg is better. Using Hydrofera Blue on all areas 09/15/17; gradual reduction in width on the long wound in the left lateral leg. No debridement required. He also has wounds on the plantar aspect of his left first second toe web space and on the dorsal aspect of the right first second toe web space. 09/22/17; there continues to be very gradual improvements in the  dimensions of the left lateral leg wound.  He hasn't round erythematous spot with might be pressure on his wheelchair. There is no evidence obviously of infection no purulence no warmth He has a dry scaled area on the plantar aspect of the left first second toe Improved area on the dorsal right first second toe. 09/29/17; left lateral leg wound continues to improve in dimensions mostly with an is still a fairly long but increasingly narrow wound. He has a dry scaled area on the plantar aspect of his left first second toe web space Increasingly concerning area on the dorsal right first second toe. In fact I am concerned today about possible cellulitis around this wound. The areas extending up his second toe and although there is deformities here almost appears to abut on the nailbed. 10/06/17; left lateral leg wound continues to make very gradual progress. Tissue culture I did from the right first second toe dorsal foot last time grew MRSA and enterococcus which was vancomycin sensitive. This was not sensitive to clindamycin or doxycycline. He is allergic to Zyvox and sulfa we have therefore arrange for him to have dalvance infusion tomorrow. He is had this in the past and tolerated it well 10/20/17; left lateral leg wound continues to make decent progress. This is certainly reduced in terms of with there is advancing epithelialization.The cellulitis in the right foot looks better although he still has a deep wound in the dorsal aspect of the first second toe web space. Plantar left first toe web space on the left I think is making some progress 10/27/17; left lateral leg wound continues to make decent progress. Advancing epithelialization.using Hydrofera Blue The right first second toe web space wound is better-looking using silver alginate Improvement in the left plantar first second toe web space. Again using silver alginate 11/03/17 left lateral leg wound continues to make decent progress albeit  slowly. Using [facility] The right per second toe web space continues to be a very problematic looking punched out wound. I obtained a piece of tissue for deep culture I did extensively treated this for fungus. It is difficult to imagine that this is a pressure area as the patient states other than going outside he doesn't really wear shoes at home The left plantar first second toe web space looked fairly senescent. Necrotic edges. This required debridement change to [facility] Blue to all wound areas 11/10/17; left lateral leg wound continues to contract. Using Hydrofera Blue On the right dorsal first second toe web space dorsally. Culture I did of this area last week grew MRSA there is not an easy oral option in this patient was multiple antibiotic allergies or intolerances. This was only a rare culture isolate I'm therefore going to use Bactroban under silver alginate On the left plantar first second toe web space. Debridement is required here. This is also unchanged 11/17/17; left lateral leg wound continues to contract using Hydrofera Blue this is no longer the major issue. The major concern here is the right first second toe web space. He now has an open area going from dorsally to the plantar aspect. There is now wound on the inner lateral part of the first toe. Not a very viable surface on this. There is erythema spreading medially into the forefoot. No major change in the left first second toe plantar wound 11/24/17; left lateral leg wound continues to contract using Hydrofera Blue. Nice improvement today The right first second toe web space all of this looks a lot less angry than last week. I have given him  clindamycin and topical Bactroban for MRSA and terbinafine for the possibility of underlining tinea pedis that I could not control with ketoconazole. Looks somewhat better The area on the plantar left first second toe web space is weeping with dried debris around the wound 12/01/17;  left lateral leg wound continues to contract he Hydrofera Blue. It is becoming thinner in terms of with nevertheless it is making good improvement. The right first second toe web space looks less angry but still a large necrotic-looking wounds starting on the plantar aspect of the right foot extending between the toes and now extensively on the base of the right second toe. I gave him clindamycin and topical Bactroban for MRSA anterior benefiting for the possibility of underlying tinea pedis. Not looking better today The area on the left first/second toe looks better. Debrided of necrotic debris 12/05/17* the patient was worked in urgently today because over the weekend he found blood on his incontinence bad when he woke up. He was found to have an ulcer by his wife who does most of his wound care. He came in today for [place] to look at this. He has not had a history of wounds in his buttocks in spite of his paraplegia. 12/08/17; seen in follow-up today at his usual appointment. He was seen earlier this week and found to have a new wound on his buttock. We also follow him for wounds on the left lateral leg, left first second toe web space and right first second toe web space 12/15/17; we have been using Hydrofera Blue to the left lateral leg which has improved. The right first second toe web space has also improved. Left first Djordjevic, Tinsley E ([id number]) [id number] Page 25 of 39 second toe web space plantar aspect looks stable. The left buttock has worsened using Santyl. Apparently the buttock has drainage 12/22/17; we have been using Hydrofera Blue to the left lateral leg which continues to improve now 2 small wounds separated by normal skin. He tells [place] he had a fever up to 100 yesterday he is prone to UTIs but has not noted anything different. He does in and out catheterizations. The area between the first and second toes today does not look good necrotic surface covered with  what looks to be purulent drainage and erythema extending into the third toe. I had gotten this to something that I thought look better last time however it is not look good today. He also has a necrotic surface over the buttock wound which is expanded. I thought there might be infection under here so I removed a lot of the surface with a #5 curet though nothing look like it really needed culturing. He is been using Santyl to this area 12/27/17; his original wound on the left lateral leg continues to improve using Hydrofera Blue. I gave him samples of Baxdella although he was unable to take them out of fear for an allergic reaction ["lump in his throat"].the culture I did of the purulent drainage from his second toe last week showed both enterococcus and a set Enterobacter I was also concerned about the erythema on the bottom of his foot although paradoxically although this looks somewhat better today. 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 We've been using silver alginate here. They came up today that he sleeps in a recliner, I'm not sure why but I asked him to stop this 01/03/18; his original  wound we've been using Hydrofera Blue is now separated into 2 areas. Ulcer on his left buttock is better he is off the recliner and sleeping in bed Finally both wound areas between his first and second toes also looks some better 01/10/18; his original wound on the left lateral leg is now separated into 2 wounds we've been using Hydrofera Blue Ulcer on his left buttock has some drainage. There is a small probing site going into muscle layer superiorly.using silver alginate -He arrives today with a deep tissue injury on the left heel The wound on the dorsal aspect of his first second toe on the left looks a lot betterusing silver alginate ketoconazole The area on the first second toe web space on the right also looks a  lot bette 01/17/18; his original wound on the left lateral leg continues to progress using Hydrofera Blue Ulcer on his left buttock also is smaller surface healthier except for a small probing site going into the muscle layer superiorly. 2.4 cm of tunneling in this area DTI on his left heel we have only been offloading. Looks better than last week no threatened open no evidence of infection the wound on the dorsal aspect of the first second toe on the left continues to look like it's regressing we have only been using silver alginate and terbinafine orally The area in the first second toe web space on the right also looks to be a lot better using silver alginate and terbinafine I think this was prompted by tinea pedis 01/31/18; the patient was hospitalized in [place] last week apparently for a complicated UTI. He was discharged on cefepime he does in and out catheterizations. In the hospital he was discovered M I don't mild elevation of AST and ALT and the terbinafine was stopped.predictably the pressure ulcer on s his buttock looks betterusing silver alginate. The area on the left lateral leg also is better using Hydrofera Blue. The area between the first and second toes on the left better. First and second toes on the right still substantial but better. Finally the DTI on the left heel has held together and looks like it's resolving 02/07/18-he is here in follow-up evaluation for multiple ulcerations. He has new injury to the lateral aspect of the last issue a pressure ulcer, he states this is from adhesive removal trauma. He states he has tried multiple adhesive products with no success. All other ulcers appear stable. The left heel DTI is resolving. We will continue with same treatment plan and follow-up next week. 02/14/18; follow-up for multiple areas. He has a new area last week on the lateral aspect of his pressure ulcer more over the posterior trochanter. The original pressure ulcer looks  quite stable has healthy granulation. 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 Almost fully epithelialized on the original superior area using Hydrofera Blue DTI on the left heel has peeled off this week to reveal a small superficial wound under denuded skin and subcutaneous tissue Both areas between the first and second toes look better including nothing open on the left 02/21/18; The patient's wounds on his left ischial tuberosity and posterior left greater trochanter actually looked better. He has a large area of irritation around the area which I think is contact dermatitis. I am doubtful that this is fungal His original wound on the left lateral calf continues to improve we have been using Hydrofera Blue There  is no open area in the left first second toe web space although there is a lot of thick callus The DTI on the left heel required debridement today of necrotic surface eschar and subcutaneous tissue using silver alginate Finally the area on the right first second toe webspace continues to contract using silver alginate and ketoconazole 02/28/18 Left ischial tuberosity wounds look better using silver alginate. Original wound on the left calf only has one small open area left using Hydrofera Blue DTI on the left heel required debridement mostly removing skin from around this wound surface. Using silver alginate The areas on the right first/second toe web space using silver alginate and ketoconazole 03/08/18 on evaluation today patient appears to be doing decently well as best I can tell in regard to his wounds. This is the first time that I have seen him as he generally is followed by Dr. Leanord Hawking. With that being said none of his wounds appear to be infected he does have an area where there is some skin covering what appears to be a new wound on the left dorsal surface of his great toe.  This is right at the nail bed. With that being said I do believe that debrided away some of the excess skin can be of benefit in this regard. Otherwise he has been tolerating the dressing changes without complication. 03/14/18; patient arrives today with the multiplicity of wounds that we are following. He has not been systemically unwell Original wound on the left lateral calf now only has 2 small open areas we've been using Hydrofera Blue which should continue The deep tissue injury on the left heel requires debridement today. We've been using silver alginate The left first second toe and the right first second toe are both are reminiscence what I think was tinea pedis. Apparently some of the callus Surface between the toes was removed last week when it started draining. Purulent drainage coming from the wound on the ischial tuberosity on the left. 03/21/18-He is here in follow-up evaluation for multiple wounds. 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 He tolerated debridement. The only change to last week's recommendations is to discontinue antifungal cream between toes. He will follow-up next week 03/28/18; following up for multiple wounds;Concern this week is streaking redness and swelling in the right foot. He is going to need antibiotics for this. 03/31/18; follow-up for right foot cellulitis. Streaking redness and swelling in the right foot on 03/28/18. 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 I put him on clindamycin 300 mg every 6 and brought him in for a quick check. He has an open wound between his first and second toes on the right foot as a potential source. 04/04/18; Right foot cellulitis is resolving he is completing clindamycin. This is truly good news Left lateral calf wound which is initial wound only has one small open area inferiorly this is close to healing out. He has compression  stockings. We will use Hydrofera Blue right down to the epithelialization of this Nonviable surface on the left heel which was initially pressure with a DTI. We've been using Hydrofera Blue. I'm going to switch this back to silver alginate Left first second toe/tinea pedis this looks better using silver alginate Right first second toe tinea pedis using silver alginate Large pressure ulcers on theLeft ischial tuberosity. Small wound here Looks better. I am uncertain about the surface over the large wound. Using silver alginate  04/11/18; Cellulitis in the right foot is resolved Left lateral calf wound which was his original wounds still has 2 tiny open areas remaining this is just about closed Nonviable surface on the left heel is better but still requires debridement Left first second toe/tinea pedis still open using silver alginate Right first second toe wound tinea pedis I asked him to go back to using ketoconazole and silver alginate Bernard, Lister E ([id number]) [id number] Page 26 of 39 Large pressure ulcers on the left ischial tuberosity this shear injury here is resolved. Wound is smaller. No evidence of infection using silver alginate 04/18/18; Patient arrives with an intense area of cellulitis in the right mid lower calf extending into the right heel area. Bright red and warm. Smaller area on the left anterior leg. He has a significant history of MRSA. He will definitely need antibioticsdoxycycline He now has 2 open areas on the left ischial tuberosity the original large wound and now a satellite area which I think was above his initial satellite areas. Not a wonderful surface on this satellite area surrounding erythema which looks like pressure related. His left lateral calf wound again his original wound is just about closed Left heel pressure injury still requiring debridement Left first second toe looks a lot better using silver alginate Right first second toe  also using silver alginate and ketoconazole cream also looks better 04/20/18; the patient was worked in early today out of concerns with his cellulitis on the right leg. I had started him on doxycycline. This was 2 days ago. His wife was concerned about the swelling in the area. Also concerned about the left buttock. He has not been systemically unwell no fever chills. No nausea vomiting or diarrhea 04/25/18; the patient's left buttock wound is continued to deteriorate he is using Hydrofera Blue. He is still completing clindamycin for the cellulitis on the right leg although all of this looks better. 05/02/18 Left buttock wound still with a lot of drainage and a very tightly adherent fibrinous necrotic surface. He has a deeper area superiorly The left lateral calf wound is still closed DTI wound on the left heel necrotic surface especially the circumference using Iodoflex Areas between his left first second toe and right first second toe both look better. Dorsally and the right first second toe he had a necrotic surface although at smaller. In using silver alginate and ketoconazole. I did a culture last week which was a deep tissue culture of the reminiscence of the open wound on the right first second toe dorsally. This grew a few Acinetobacter and a few methicillin-resistant staph aureus. Nevertheless the area actually this week looked better. I didn't feel the need to specifically address this at least in terms of systemic antibiotics. 05/09/18; wounds are measuring larger more drainage per our intake. We are using Santyl covered with alginate on the large superficial buttock wounds, Iodosorb on the left heel, ketoconazole and silver alginate to the dorsal first and second toes bilaterally. 05/16/18; The area on his left buttock better in some aspects although the area superiorly over the ischial tuberosity required an extensive debridement.using Santyl Left heel appears stable. Using Iodoflex The  areas between his first and second toes are not bad however there is spreading erythema up the dorsal aspect of his left foot this looks like cellulitis again. He is insensate the erythema is really very brilliant.o Erysipelas He went to see an allergist days ago because he was itching part of this he had lab work  done. This showed a white count of 15.1 with 70% neutrophils. Hemoglobin of 11.4 and a platelet count of 659,000. Last white count we had in Epic was a 2-1/2 years ago which was 25.9 but he was ill at the time. He was able to show me some lab work that was done by his primary physician the pattern is about the same. 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 But prompted me to go ahead and do x-rays of both feet and the pelvis rule out osteomyelitis. He also had a comprehensive metabolic panel this was reasonably normal his albumin was 3.7 liver function tests BUN/creatinine all normal 05/23/18; x-rays of both his feet from last week were negative for underlying pulmonary abnormality. The x-ray of his pelvis however showed mild irregularity in the left ischial which may represent some early osteomyelitis. The wound in the left ischial continues to get deeper clearly now exposed muscle. Each week necrotic surface material over this area. Whereas the rest of the wounds do not look so bad. The left ischial wound we have been using Santyl and calcium alginate T the left heel surface necrotic debris using Iodoflex o The left lateral leg is still healed Areas on the left dorsal foot and the right dorsal foot are about the same. There is some inflammation on the left which might represent contact dermatitis, fungal dermatitis I am doubtful cellulitis although this looks better than last week 05/30/18; CT scan done at Hospital did not show any osteomyelitis or abscess. Suggested the possibility of underlying cellulitis although I don't see a lot of evidence  of this at the bedside The wound itself on the left buttock/upper thigh actually looks somewhat better. No debridement Left heel also looks better no debridement continue Iodoflex Both dorsal first second toe spaces appear better using Lotrisone. Left still required debridement 06/06/18; Intake reported some purulent looking drainage from the left gluteal wound. Using Santyl and calcium alginate Left heel looks better although still a nonviable surface requiring debridement The left dorsal foot first/second webspace actually expanding and somewhat deeper. I may consider doing a shave biopsy of this area Right dorsal foot first/second webspace appears stable to improved. Using Lotrisone and silver alginate to both these areas 06/13/18 Left gluteal surface looks better. Now separated in the 2 wounds. No debridement required. Still drainage. We'll continue silver alginate Left heel continues to look better with Iodoflex continue this for at least another week Of his dorsal foot wounds the area on the left still has some depth although it looks better than last week. We've been using Lotrisone and silver alginate 06/20/18 Left gluteal continues to look better healthy tissue Left heel continues to look better healthy granulation wound is smaller. He is using Iodoflex and his long as this continues continue the Iodoflex Dorsal right foot looks better unfortunately dorsal left foot does not. There is swelling and erythema of his forefoot. He had minor trauma to this several days ago but doesn't think this was enough to have caused any tissue injury. Foot looks like cellulitis, we have had this problem before 06/27/18 on evaluation today patient appears to be doing a little worse in regard to his foot ulcer. 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 Both of which would  really be his only options for treating  this infection. In the past he has been given and effusion of Orbactiv. This is done very well for him in the past again it's one time dosing IV antibiotic therapy. Subsequently I do believe this is something we're gonna need to see about doing at this point in time. Currently his other wounds seem to be doing somewhat better in my pinion I'm pretty happy in that regard. 07/03/18 on evaluation today patient's wounds actually appear to be doing fairly well. He has been tolerating the dressing changes without complication. All in all he seems to be showing signs of improvement. In regard to the antibiotics he has been dealing with infectious disease since I saw him last week as far as getting this scheduled. In the end [facility]'s [facility] this coming Friday. In the meantime he has been continuing to perform the dressing changes in such as previous. There does not appear to be any evidence of infection worsengin at this time. 07/10/18; Since I last saw this man 2 weeks ago things have actually improved. IV antibiotics of resulted in less forefoot erythema although there is still some present. He is not systemically unwell Left buttock wounds 2 now have no depth there is increased epithelialization Using silver alginate Left heel still requires debridement using Iodoflex Left dorsal foot still with a sizable wound about the size of a border but healthy granulation Right dorsal foot still with a slitlike area using silver alginate 07/18/18; the patient's cellulitis in the left foot is improved in fact I think it is on its way to resolving. Left buttock wounds 2 both look better although the larger one has hypertension granulation we've been using silver alginate Left heel has some thick circumferential redundant skin over the wound edge which will need to be removed today we've been using Iodoflex Left dorsal  foot is still a sizable wound required debridement using silver alginate The right dorsal foot is just about closed only a small open area remains here 07/25/18; left foot cellulitis is resolved Left buttock wounds 2 both look better. Hyper-granulation on the major area Left heel as some debris over the surface but otherwise looks a healthier wound. Using silver collagen Right dorsal foot is just about closed Wilmeth, Bates E ([id number]) [id number] Page 27 of 39 07/31/18; arrives with our intake nurse worried about purulent drainage from the buttock. We had hyper-granulation here last week His buttock wounds 2 continue to look better Left heel some debris over the surface but measuring smaller. Right dorsal foot unfortunately has openings between the toes Left foot superficial wound looks less aggravated. 08/07/18 Buttock wounds continue to look better although some of her granulation and the larger medial wound. silver alginate Left heel continues to look a lot better.silver collagen Left foot superficial wound looks less stable. Requires debridement. He has a new wound superficial area on the foot on the lateral dorsal foot. Right foot looks better using silver alginate without Lotrisone 08/14/2018; patient was in the ER last week diagnosed with a UTI. He is now on Cefpodoxime and Macrodantin. Buttock wounds continued to be smaller. Using silver alginate Left heel continues to look better using silver collagen Left foot superficial wound looks as though it is improving Right dorsal foot area is just about healed. 08/21/2018; patient is completed his antibiotics for his UTI. He has 2 open areas on the buttocks. There is still not closed although the surface looks satisfactory. Using  silver alginate Left heel continues to improve using silver collagen The bilateral dorsal foot areas which are at the base of his first and second toes/possible tinea pedis are actually stable  on the left but worse on the right. The area on the left required debridement of necrotic surface. After debridement I obtained a specimen for PCR culture. The right dorsal foot which is been just about healed last week is now reopened 08/28/2018; culture done on the left dorsal foot showed coag negative staph both staph epidermidis and Lugdunensis. I think this is worthwhile initiating systemic treatment. I will use doxycycline given his long list of allergies. The area on the left heel slightly improved but still requiring debridement. The large wound on the buttock is just about closed whereas the smaller one is larger. Using silver alginate in this area 09/04/2018; patient is completing his doxycycline for the left foot although this continues to be a very difficult wound area with very adherent necrotic debris. We are using silver alginate to all his wounds right foot left foot and the small wounds on his buttock, silver collagen on the left heel. 09/11/2018; once again this patient has intense erythema and swelling of the left forefoot. Lesser degrees of erythema in the right foot. He has a long list of allergies and intolerances. I will reinstitute doxycycline. 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 Using silver alginate Left heel also looks better using silver collagen Unfortunately both the areas on his feet look worse. The area on the left first second webspace is now gone through to the plantar part of his foot. The area on the left foot anteriorly is irritated with erythema and swelling in the forefoot. 09/25/2018 His wound on the left plantar heel looks better. Using silver collagen The area on the left buttock 2 small remnant areas. One is closed one is still open. Using silver alginate The areas between both his first and second toes look worse. This in spite of long-standing antifungal therapy with ketoconazole and silver alginate  which should have antifungal activity He has small areas around his original wound on the left calf one is on the bottom of the original scar tissue and one superiorly both of these are small and superficial but again given wound history in this site this is worrisome 10/02/2018 Left plantar heel continues to gradually contract using silver collagen Left buttock wound is unchanged using silver alginate The areas on his dorsal feet between his first and second toes bilaterally look about the same. I prescribed clindamycin ointment to see if we can address chronic staph colonization and also the underlying possibility of erythrasma The left lateral lower extremity wound is actually on the lateral part of his ankle. Small open area here. We have been using silver alginate 10/09/2018; Left plantar heel continues to look healthy and contract. No debridement is required Left buttock slightly smaller with a tape injury wound just below which was new this week Dorsal feet somewhat improved I have been using clindamycin Left lateral looks lower extremity the actual open area looks worse although a lot of this is epithelialized. I am going to change to silver collagen today He has a lot more swelling in the right leg although this is not pitting not red and not particularly warm there is a lot of spasm in the right leg usually indicative of people with paralysis of some underlying discomfort. We have reviewed his vascular  status from 2017 he had a left greater saphenous vein ablation. I wonder about referring him back to vascular surgery if the area on the left leg continues to deteriorate. 10/16/2018 in today for follow-up and management of multiple lower extremity ulcers. His left Buttock wound is much lower smaller and almost closed completely. The wound to the left ankle has began to reopen with Epithelialization and some adherent slough. He has multiple new areas to the left foot and leg. The left  dorsal foot without much improvement. Wound present between left great webspace and 2nd toe. Erythema and edema present right leg. Right LE ultrasound obtained on 10/10/18 was negative for DVT . 10/23/2018; Left buttock is closed over. Still dry macerated skin but there is no open wound. I suspect this is chronic pressure/moisture Left lateral calf is quite a bit worse than when I saw this last. There is clearly drainage here he has macerated skin into the left plantar heel. We will change the primary dressing to alginate Left dorsal foot has some improvement in overall wound area. Still using clindamycin and silver alginate Right dorsal foot about the same as the left using clindamycin and silver alginate The erythema in the right leg has resolved. He is DVT rule out was negative Left heel pressure area required debridement although the wound is smaller and the surface is health 10/26/2018 The patient came back in for his nurse check today predominantly because of the drainage coming out of the left lateral leg with a recent reopening of his original wound on the left lateral calf. He comes in today with a large amount of surrounding erythema around the wound extending from the calf into the ankle and even in the area on the dorsal foot. He is not systemically unwell. He is not febrile. Nevertheless this looks like cellulitis. We have been using silver alginate to the area. I changed him to a regular visit and I am going to prescribe him doxycycline. The rationale here is a long list of medication intolerances and a history of MRSA. I did not see anything that I thought would provide a valuable culture 10/30/2018 Follow-up from his appointment 4 days ago with really an extensive area of cellulitis in the left calf left lateral ankle and left dorsal foot. I put him on doxycycline. He has a long list of medication allergies which are true allergy reactions. 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 In any case he is a lot better today. The erythema especially in the anterior and lateral left calf is better. He still has left ankle erythema. He also is complaining about increasing edema in the right leg we have only been using Kerlix Coban and he has been doing the wraps at home. Finally he has a spotty rash on the medial part of his upper left calf which looks like folliculitis or perhaps wrap occlusion type injury. Small superficial macules not pustules 11/06/18 patient arrives today with again a considerable degree of erythema around the wound on the left lateral calf extending into the dorsal ankle and dorsal foot. This is a lot worse than when I saw this last week. He is on doxycycline really with not a lot of improvement. He has not been systemically unwell Wounds on the; left heel actually looks improved. Original area on the left foot and proximity to the first and second toes looks about the same. He has superficial areas on  the dorsal foot, anterior calf and then the reopening of his original wound on the left lateral calf which looks about the same The only area he has on the right is the dorsal webspace first and second which is smaller. He has a large area of dry erythematous skin on the left buttock small open area here. Gressman, EXZAVIER RUDERMAN ([id number]) [id number] Page 28 of 39 11/13/2018; the patient arrives in much better condition. The erythema around the wound on the left lateral calf is a lot better. Not sure whether this was the clindamycin or the TCA and ketoconazole or just in the improvement in edema control [stasis dermatitis]. In any case this is a lot better. The area on the left heel is very small and just about resolved using silver collagen we have been using silver alginate to the areas on his dorsal feet 11/20/2018; his wounds include the left lateral calf, left heel, dorsal  aspects of both feet just proximal to the first second webspace. He is stable to slightly improved. I did not think any changes to his dressings were going to be necessary 11/27/2018 he has a reopening on the left buttock which is surrounded by what looks like tinea or perhaps some other form of dermatitis. The area on the left dorsal foot has some erythema around it I have marked this area but I am not sure whether this is cellulitis or not. Left heel is not closed. Left calf the reopening is really slightly longer and probably worse 1/13; in general things look better and smaller except for the left dorsal foot. Area on the left heel is just about closed, left buttock looks better only a small wound remains in the skin looks better [using Lotrisone] 1/20; the area on the left heel only has a few remaining open areas here. Left lateral calf about the same in terms of size, left dorsal foot slightly larger right lateral foot still not closed. The area on the left buttock has no open wound and the surrounding skin looks a lot better 1/27; the area on the left heel is closed. Left lateral calf better but still requiring extensive debridements. The area on his left buttock is closed. He still has the open areas on the left dorsal foot which is slightly smaller in the right foot which is slightly expanded. We have been using Iodoflex on these areas as well 2/3; left heel is closed. Left lateral calf still requiring debridement using Iodoflex there is no open area on his left buttock however he has dry scaly skin over a large area of this. Not really responding well to the Lotrisone. Finally the areas on his dorsal feet at the level of the first second webspace are slightly smaller on the right and about the same on the left. Both of these vigorously debrided with Anasept and gauze 2/10; left heel remains closed he has dry erythematous skin over the left buttock but there is no open wound here. Left lateral  leg has come in and with. Still requiring debridement we have been using Iodoflex here. Finally the area on the left dorsal foot and right dorsal foot are really about the same extremely dry callused fissured areas. He does not yet have a dermatology appointment 2/17; left heel remains closed. He has a new open area on the left buttock. The area on the left lateral calf is bigger longer and still covered in necrotic debris. No major change in his foot areas bilaterally. I am awaiting  for a dermatologist to look on this. We have been using ketoconazole I do not know that this is been doing any good at all. 2/24; left heel remains closed. The left buttock wound that was new reopening last week looks better. The left lateral calf appears better also although still requires debridement. The major area on his foot is the left first second also requiring debridement. We have been putting Prisma on all wounds. I do not believe that the ketoconazole has done too much good for his feet. He will use Lotrisone I am going to give him a 2-week course of terbinafine. We still do not have a dermatology appointment 3/2 left heel remains closed however there is skin over bone in this area I pointed this out to him today. The left buttock wound is epithelialized but still does not look completely stable. The area on the left leg required debridement were using silver collagen here. With regards to his feet we changed to Lotrisone last week and silver alginate. 3/9; left heel remains closed. Left buttock remains closed. The area on the right foot is essentially closed. The left foot remains unchanged. Slightly smaller on the left lateral calf. Using silver collagen to both of these areas 3/16-Left heel remains closed. Area on right foot is closed. Left lateral calf above the lateral malleolus open wound requiring debridement with easy bleeding. Left dorsal wound proximal to first toe also debrided. Left ischial area  open new. Patient has been using Prisma with wrapping every 3 days. Dermatology appointment is apparently tomorrow.Patient has completed his terbinafine 2-week course with some apparent improvement according to him, there is still flaking and dry skin in his foot on the left 3/23; area on the right foot is reopened. The area on the left anterior foot is about the same still a very necrotic adherent surface. He still has the area on the left leg and reopening is on the left buttock. He apparently saw dermatology although I do not have a note. According to the patient who is usually fairly well informed they did not have any good ideas. Put him on oral terbinafine which she is been on before. 3/30; using silver collagen to all wounds. Apparently his dermatologist put him on doxycycline and rifampin presumably some culture grew staph. I do not have this result. He remains on terbinafine although I have used terbinafine on him before 4/6; patient has had a fairly substantial reopening on the right foot between the first and second toes. He is finished his terbinafine and I believe is on doxycycline and rifampin still as prescribed by dermatology. We have been using silver collagen to all his wounds although the patient reports that he thinks silver alginate does better on the wounds on his buttock. 4/13; the area on his left lateral calf about the same size but it did not require debridement. Left dorsal foot just proximal to the webspace between the first and second toes is about the same. Still nonviable surface. I note some superficial bronze discoloration of the dorsal part of his foot Right dorsal foot just proximal to the first and second toes also looks about the same. I still think there may be the same discoloration I noted above on the left Left buttock wound looks about the same 4/20; left lateral calf appears to be gradually contracting using silver collagen. He remains on erythromycin  empiric treatment for possible erythrasma involving his digital spaces. The left dorsal foot wound is debrided of tightly adherent  necrotic debris and really cleans up quite nicely. The right area is worse with expansion. I did not debride this it is now over the base of the second toe The area on his left buttock is smaller no debridement is required using silver collagen 5/4; left calf continues to make good progress. He arrives with erythema around the wounds on his dorsal foot which even extends to the plantar aspect. Very concerning for coexistent infection. He is finished the erythromycin I gave him for possible erythrasma this does not seem to have helped. The area on the left foot is about the same base of the dorsal toes Is area on the buttock looks improved on the left 5/11; left calf and left buttock continued to make good progress. Left foot is about the same to slightly improved. Major problem is on the right foot. He has not had an x-ray. Deep tissue culture I did last week showed both Enterobacter and E. coli. I did not change the doxycycline I put him on empirically although neither 1 of these were plated to doxycycline. He arrives today with the erythema looking worse on both the dorsal and plantar foot. Macerated skin on the bottom of the foot. he has not been systemically unwell 5/18-Patient returns at 1 week, left calf wound appears to be making some progress, left buttock wound appears slightly worse than last time, left foot wound looks slightly better, right foot redness is marginally better. X-ray of both feet show no air or evidence of osteomyelitis. Patient is finished his Omnicef and terbinafine. He continues to have macerated skin on the bottom of the left foot as well as right 5/26; left calf wound is better, left buttock wound appears to have multiple small superficial open areas with surrounding macerated skin. X-rays that I did last time showed no evidence of  osteomyelitis in either foot. He is finished cefdinir and doxycycline. I do not think that he was on terbinafine. He continues to have a large superficial open area on the right foot anterior dorsal and slightly between the first and second toes. I did send him to dermatology 2 months ago or so wondering about whether they would do a fungal scraping. I do not believe they did but did do a culture. We have been using silver alginate to the toe areas, he has been using antifungals at home topically either ketoconazole or Lotrisone. We are using silver collagen on the left foot, silver alginate on the right, silver collagen on the left lateral leg and silver alginate on the left buttock 6/1; left buttock area is healed. We have the left dorsal foot, left lateral leg and right dorsal foot. We are using silver alginate to the areas on both feet and silver collagen to the area on his left lateral calf 6/8; the left buttock apparently reopened late last week. He is not really sure how this happened. He is tolerating the terbinafine. Using silver alginate to all wounds 6/15; left buttock wound is larger than last week but still superficial. Came in the clinic today with a report of purulence from the left lateral leg I did not identify any infection Both areas on his dorsal feet appear to be better. He is tolerating the terbinafine. Using silver alginate to all wounds 6/22; left buttock is about the same this week, left calf quite a bit better. His left foot is about the same however he comes in with erythema and warmth in the right forefoot once again. Culture that  I gave him in the beginning of May showed Enterobacter and E. coli. I gave him doxycycline and things seem to improve although neither 1 of these organisms was specifically plated. 6/29; left buttock is larger and dry this week. Left lateral calf looks to me to be improved. Left dorsal foot also somewhat improved right foot completely unchanged.  The erythema on the right foot is still present. He is completing the Ceftin dinner that I gave him empirically [see discussion above.) 7/6 - All wounds look to be stable and perhaps improved, the left buttock wound is slightly smaller, per patient bleeds easily, completed ceftin, the right foot redness is less, he is on terbinafine 7/13; left buttock wound about the same perhaps slightly narrower. Area on the left lateral leg continues to narrow. Left dorsal foot slightly smaller right foot Klumpp, Gordie E ([id number]) [id number] Page 29 of 39 about the same. We are using silver alginate on the right foot and Hydrofera Blue to the areas on the left. Unna boot on the left 2 layer compression on the right 7/20; left buttock wound absolutely the same. Area on lateral leg continues to get better. Left dorsal foot require debridement as did the right no major change in the 7/27; left buttock wound the same size necrotic debris over the surface. The area on the lateral leg is closed once again. His left foot looks better right foot about the same although there is some involvement now of the posterior first second toe area. He is still on terbinafine which I have given him for a month, not certain a centimeter major change 06/25/19-All wounds appear to be slightly improved according to report, left buttock wound looks clean, both foot wounds have minimal to no debris the right dorsal foot has minimal slough. We are using Hydrofera Blue to the left and silver alginate to the right foot and ischial wound. 8/10-Wounds all appear to be around the same, the right forefoot distal part has some redness which was not there before, however the wound looks clean and small. Ischial wound looks about the same with no changes 8/17; his wound on the left lateral calf which was his original chronic venous insufficiency wound remains closed. Since I last saw him the areas on the left dorsal foot  right dorsal foot generally appear better but require debridement. The area on his left initial tuberosity appears somewhat larger to me perhaps hyper granulated and bleeds very easily. We have been using Hydrofera Blue to the left dorsal foot and silver alginate to everything else 8/24; left lateral calf remains closed. The areas on his dorsal feet on the webspace of the first and second toes bilaterally both look better. The area on the left buttock which is the pressure ulcer stage II slightly smaller. I change the dressing to Hydrofera Blue to all areas 8/31; left lateral calf remains closed. The area on his dorsal feet bilaterally look better. Using Hydrofera Blue. Still requiring debridement on the left foot. No change in the left buttock pressure ulcers however 9/14; left lateral calf remains closed. Dorsal feet look quite a bit better than 2 weeks ago. Flaking dry skin also a lot better with the ammonium lactate I gave him 2 weeks ago. The area on the left buttock is improved. He states that his Roho cushion developed a leak and he is getting a new one, in the interim he is offloading this vigorously 9/21; left calf remains closed. Left heel which was a possible DTI  looks better this week. He had macerated tissue around the left dorsal foot right foot looks satisfactory and improved left buttock wound. I changed his dressings to his feet to silver alginate bilaterally. Continuing Hydrofera Blue on the left buttock. 9/28 left calf remains closed. Left heel did not develop anything [possible DTI] dry flaking skin on the left dorsal foot. Right foot looks satisfactory. Improved left buttock wound. We are using silver alginate on his feet Hydrofera Blue on the buttock. I have asked him to go back to the Lotrisone on his feet including the wounds and surrounding areas 10/5; left calf remains closed. The areas on the left and right feet about the same. A lot of this is epithelialized however debris  over the remaining open areas. He is using Lotrisone and silver alginate. The area on the left buttock using Hydrofera Blue 10/26. Patient has been out for 3 weeks secondary to Covid concerns. He tested negative but I think his wife tested positive. He comes in today with the left foot substantially worse, right foot about the same. Even more concerning he states that the area on his left buttock closed over but then reopened and is considerably deeper in one aspect than it was before [stage III wound] 11/2; left foot really about the same as last week. Quarter sized wound on the dorsal foot just proximal to the first second toes. Surrounding erythema with areas of denuded epithelium. This is not really much different looking. Did not look like cellulitis this time however. Right foot area about the same.. We have been using silver alginate alginate on his toes Left buttock still substantial irritated skin around the wound which I think looks somewhat better. We have been using Hydrofera Blue here. 11/9; left foot larger than last week and a very necrotic surface. Right foot I think is about the same perhaps slightly smaller. Debris around the circumference also addressed. Unfortunately on the left buttock there is been a decline. Satellite lesions below the major wound distally and now a an additional one posteriorly we have been using Hydrofera Blue but I think this is a pressure issue 11/16; left foot ulcer dorsally again a very adherent necrotic surface. Right foot is about the same. Not much change in the pressure ulcer on his left buttock. 11/30; left foot ulcer dorsally basically the same as when I saw him 2 weeks ago. Very adherent fibrinous debris on the wound surface. Patient reports a lot of drainage as well. 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 We have been using Iodoflex, patient changed back to alginate because of the drainage. Area  on his right dorsal foot really looks benign with a healthier surface certainly a lot better than on the left. Left buttock wounds all improved using Hydrofera Blue 12/7; left dorsal foot again no improvement. Tightly adherent debris. PCR culture I did last week only showed likely skin contaminant. I have gone ahead and done a punch biopsy of this which is about the last thing in terms of investigations I can think to do. He has known venous insufficiency and venous hypertension and this could be the issue here. The area on the right foot is about the same left buttock slightly worse according to our intake nurse secondary to [facility] Blue sticking to the wound 12/14; biopsy of the left foot that I did last time showed changes that could be related to wound healing/chronic stasis dermatitis phenomenon no neoplasm. We have  been using silver alginate to both feet. I change the one on the left today to Sorbact and silver alginate to his other 2 wounds 12/28; the patient arrives with the following problems; Major issue is the dorsal left foot which continues to be a larger deeper wound area. Still with a completely nonviable surface Paradoxically the area mirror image on the right on the right dorsal foot appears to be getting better. He had some loss of dry denuded skin from the lower part of his original wound on the left lateral calf. Some of this area looked a little vulnerable and for this reason we put him in wrap that on this side this week The area on his left buttock is larger. He still has the erythematous circular area which I think is a combination of pressure, sweat. This does not look like cellulitis or fungal dermatitis 11/26/2019; -Dorsal left foot large open wound with depth. Still debris over the surface. Using Sorbact The area on the dorsal right foot paradoxically has closed over He has a reopening on the left ankle laterally at the base of his original wound that extended up into  the calf. This appears clean. The left buttock wound is smaller but with very adherent necrotic debris over the surface. We have been using silver alginate here as well The patient had arterial studies done in 2017. He had biphasic waveforms at the dorsalis pedis and posterior tibial bilaterally. ABI in the left was 1.17. Digit waveforms were dampened. He has slight spasticity in the great toes I do not think a TBI would be possible 1/11; the patient comes in today with a sizable reopening between the first and second toes on the right. This is not exactly in the same location where we have been treating wounds previously. According to our intake nurse this was actually fairly deep but 0.6 cm. The area on the left dorsal foot looks about the same the surface is somewhat cleaner using Sorbact, his MRI is in 2 days. We have not managed yet to get arterial studies. The new reopening on the left lateral calf looks somewhat better using alginate. The left buttock wound is about the same using alginate 1/18; the patient had his ARTERIAL studies which were quite normal. ABI in the right at 1.13 with triphasic/biphasic waveforms on the left ABI 1.06 again with triphasic/biphasic waveforms. It would not have been possible to have done a toe brachial index because of spasticity. We have been using Sorbac to the left foot alginate to the rest of his wounds on the right foot left lateral calf and left buttock 1/25; arrives in clinic with erythema and swelling of the left forefoot worse over the first MTP area. This extends laterally dorsally and but also posteriorly. Still has an area on the left lateral part of the lower part of his calf wound it is eschared and clearly not closed. Area on the left buttock still with surrounding irritation and erythema. Right foot surface wound dorsally. The area between the right and first and second toes appears better. 2/1; The left foot wound is about the same. Erythema  slightly better I gave him a week of doxycycline empirically Right foot wound is more extensive extending between the toes to the plantar surface Left lateral calf really no open surface on the inferior part of his original wound however the entire area still looks vulnerable Absolutely no improvement in the left buttock wound required debridement. 2/8; the left foot is about the  same. Erythema is slightly improved I gave him clindamycin last week. Right foot looks better he is using Lotrimin and silver alginate He has a breakdown in the left lateral calf. Denuded epithelium which I have removed Left buttock about the same were using Hydrofera Blue 2/15; left foot is about the same there is less surrounding erythema. Surface still has tightly adherent debris which I have debriding however not making any progress Lutterman, Stefon E ([id number]) [id number] Page 30 of 39 Right foot has a substantial wound on the medial right second toe between the first and second webspace. Still an open area on the left lateral calf distal area. Buttock wound is about the same 2/22; left foot is about the same less surrounding erythema. Surface has adherent debris. Polymen Ag Right foot area significant wound between the first and second toes. We have been using silver alginate here Left lateral leg polymen Ag at the base of his original venous insufficiency wound Left buttock some improvement here 3/1; Right foot is deteriorating in the first second toe webspace. Larger and more substantial. We have been using silver alginate. Left dorsal foot about the same markedly adherent surface debris using PolyMem Ag Left lateral calf surface debris using PolyMem AG Left buttock is improved again using PolyMem Ag. He is completing his terbinafine. The erythema in the foot seems better. He has been on this for 2 weeks 3/8; no improvement in any wound area in fact he has a small open area on the  dorsal midfoot which is new this week. He has not gotten his foot x-rays yet 3/15; his x-rays were both negative for osteomyelitis of both feet. No major change in any of his wounds on the extremities however his buttock wounds are better. We have been using polymen on the buttocks, left lower leg. Iodoflex on the left foot and silver alginate on the right 3/22; arrives in clinic today with the 2 major issues are the improvement in the left dorsal foot wound which for once actually looks healthy with a nice healthy wound surface without debridement. Using Iodoflex here. Unfortunately on the left lateral calf which is in the distal part of his original wound he came to the clinic here for there was purulent drainage noted some increased breakdown scattered around the original area and a small area proximally. We we are using polymen here will change to silver alginate today. His buttock wound on the left is better and I think the area on the right first second toe webspace is also improved 3/29; left dorsal foot looks better. Using Iodoflex. Left ankle culture from deterioration last time grew E. coli, Enterobacter and Enterococcus. I will give him a course of cefdinir although that will not cover Enterococcus. The area on the right foot in the webspace of the first and second toe lateral first toe looks better. The area on his buttock is about healed Vascular appointment is on April 21. This is to look at his venous system vis--vis continued breakdown of the wounds on the left including the left lateral leg and left dorsal foot he. He has had previous ablations on this side 4/5; the area between the right first and second toes lateral aspect of the first toe looks better. Dorsal aspect of the left first toe on the left foot also improved. Unfortunately the left lateral lower leg is larger and there is a second satellite wound superiorly. The usual superficial abrasions on the left buttock overall  better but certainly not  closed 4/12; the area between the right first and second toes is improved. Dorsal aspect of the left foot also slightly smaller with a vibrant healthy looking surface. No real change in the left lateral leg and the left buttock wound is healed He has an unaffordable co-pay for Aplig[employer]. Appointment with vein and vascular with regards to the left leg venous part of the circulation is on 4/21 4/19; we continue to see improvement in all wound areas. Although this is minor. He has his vascular appointment on 4/21. The area on the left buttock has not reopened although right in the center of this area the skin looks somewhat threatened 4/26; the left buttock is unfortunately reopened. In general his left dorsal foot has a healthy surface and looks somewhat smaller although it was not measured as such. The area between his first and second toe webspace on the right as a small wound against the first toe. The patient saw vascular surgery. The real question I was asking was about the small saphenous vein on the left. He has previously ablated left greater saphenous vein. Nothing further was commented on on the left. Right greater saphenous vein without reflux at the saphenofemoral junction or proximal thigh there was no indication for ablation of the right greater saphenous vein duplex was negative for DVT bilaterally. They did not think there was anything from a vascular surgery point of view that could be offered. They ABIs within normal limits 5/3; only small open area on the left buttock. The area on the left lateral leg which was his original venous reflux is now 2 wounds both which look clean. We are using Iodoflex on the left dorsal foot which looks healthy and smaller. He is down to a very tiny area between the right first and second toes, using silver alginate 5/10; all of his wounds appear better. We have much better edema control in 4 layer compression on the left. This may  be the factor that is allowing the left foot and left lateral calf to heal. He has external compression garments at home 04/14/20-All of his wounds are progressing well, the left forefoot is practically closed, left ischium appears to be about the same, right toe webspace is also smaller. The left lateral leg is about the same, continue using Hydrofera Blue to this, silver alginate to the ischium, Iodoflex to the toe space on the right 6/7; most of his wounds outside of the left buttock are doing well. The area on the left lateral calf and left dorsal foot are smaller. The area on the right foot in between the first and second toe webspace is barely visible although he still says there is some drainage here is the only reason I did not heal this out. Unfortunately the area on the left buttock almost looks like he has a skin tear from tape. He has open wound and then a large flap of skin that we are trying to get adherence over an area just next to the remaining wound 6/21; 2 week follow-up. I believe is been here for nurse visits. Miraculously the area between his first and second toes on the left dorsal foot is closed over. Still open on the right first second web space. The left lateral calf has 2 open areas. Distally this is more superficial. The proximal area had a little more depth and required debridement of adherent necrotic material. His buttock wound is actually larger we have been using silver alginate here 6/28; the patient's area  on the left foot remains closed. Still open wet area between the first and second toes on the right and also extending into the plantar aspect. We have been using silver alginate in this location. He has 2 areas on the left lower leg part of his original long wounds which I think are better. We have been using Hydrofera Blue here. Hydrofera Blue to the left buttock which is stable 7/12; left foot remains closed. Left ankle is closed. May be a small area between his  right first and second toes the only truly open area is on the left buttock. We have been using Hydrofera Blue here 7/19; patient arrives with marked deterioration especially in the left foot and ankle. We did not put him in a compression wrap on the left last week in fact he wore his juxta lite stockings on either side although he does not have an underlying stocking. He has a reopening on the left dorsal foot, left lateral ankle and a new area on the right dorsal ankle. More worrisome is the degree of erythema on the left foot extending on the lateral foot into the lateral lower leg on the left 7/26; the patient had erythema and drainage from the lateral left ankle last week. Culture of this grew MRSA resistant to doxycycline and clindamycin which are the 2 antibiotics we usually use with this patient who has multiple antibiotic allergies including linezolid, trimethoprim sulfamethoxazole. I had give him an empiric doxycycline and he comes in the area certainly looks somewhat better although it is blotchy in his lower leg. He has not been systemically unwell. He has had areas on the left dorsal foot which is a reopening, chronic wounds on the left lateral ankle. Both of these I think are secondary to chronic venous insufficiency. The area between his first and second toes is closed as far as I can tell. He had a new wrap injury on the right dorsal ankle last week. Finally he has an area on the left buttock. We have been using silver alginate to everything except the left buttock we are using Hydrofera Blue 06/30/20-Patient returns at 1 week, has been given a sample dose pack of NUZYRA which is a tetracycline derivative [omadacycline], patient has completed those, we have been using silver alginate to almost all the wounds except the left ischium where we are using Hydrofera Blue all of them look better 8/16; since I last saw the patient he has been doing well. The area on the left buttock, left lateral  ankle and left foot are all closed today. He has completed the [place] I gave him last time and tolerated this well. He still has open areas on the right dorsal ankle and in the right first second toe area which we are using silver alginate. 8/23; we put him in his bilateral external compression stockings last week as he did not have anything open on either leg except for concerning area between the right first and second toe. He comes in today with an area on the left dorsal foot slightly more proximal than the original wound, the left lateral foot but this is actually a continuation of the area he had on the left lateral ankle from last time. As well he is opened up on the left buttock again. 8/30; comes in today with things looking a lot better. The area on the left lower ankle has closed down as has the left foot but with eschar in both areas. The area on the  dorsal right ankle is also epithelialized. Very little remaining of the left buttock wound. We have been using silver alginate on all wound areas 9/13; the area in the first second toe webspace on the right has fully epithelialized. He still has some vulnerable epithelium on the right and the ankle and the dorsal foot. He notes weeping. He is using his juxta lite stocking. On the left again the left dorsal foot is closed left lateral ankle is closed. We went to the juxta lite stocking here as well. Still vulnerable in the left buttock although only 2 small open areas remain here 9/27; 2-week follow-up. We did not look at his left leg but the patient says everything is closed. He is a bit disturbed by the amount of edema in his left foot he is using juxta lite stockings but asking about over the toes stockings which would be 30/40, will talk to him next time. According to him there is no open wound on either the left foot or the left ankle/calf He has an open area on the dorsal right calf which I initially point a wrap injury. He has  superficial remaining wound on the left ischial tuberosity been using Simmer, Ali E ([id number]) [id number] Page 31 of 39 silver alginate although he says this sticks to the wound 10/5; we gave him 2-week follow-up but he called yesterday expressing some concerns about his right foot right ankle and the left buttock. He came in early. There is still no open areas on the left leg and that still in his juxta lite stocking 10/11; he only has 1 small area on the left buttock that remains measuring millimeters 1 mm. Still has the same irritated skin in this area. We recommended zinc oxide when this eventually closes and pressure relief is meticulously is he can do this. He still has an area on the dorsal part of his right first through third toes which is a bit irritated and still open and on the dorsal ankle near the crease of the ankle. We have been using silver alginate and using his own stocking. He has nothing open on the left leg or foot 10/25; 2-week follow-up. Not nearly as good on the left buttock as I was hoping. For open areas with 5 looking threatened small. He has the erythematous irritated chronic skin in this area. 1 area on the right dorsal ankle. He reports this area bleeds easily Right dorsal foot just proximal to the base of his toes We have been using silver alginate. 11/8; 2-week follow-up. Left buttock is about the same although I do not think the wounds are in the same location we have been using silver alginate. I have asked him to use zinc oxide on the skin around the wounds. He still has a small area on the right dorsal ankle he reports this bleeds easily Right dorsal foot just proximal to the base of the toes does not have anything open although the skin is very dry and scaly He has a new opening on the nailbed of the left great toe. Nothing on the left ankle 11/29; 3-week follow-up. Left buttock has 2 open areas. And washing of these wounds today  started bleeding easily. Suggesting very friable tissue. We have been using silver alginate. Right dorsal ankle which I thought was initially a wrap injury we have been using silver alginate. Nothing open between the toes that I can see. He states the area on the left dorsal toe nailbed healed after the  last visit in 2 or 3 days 12/13; 3-week follow-up. His left buttock now has 3 open areas but the original 2 areas are smaller using polymen here. Surrounding skin looks better. The right dorsal ankle is closed. He has a small opening on the right dorsal foot at the level of the third toe. In general the skin looks better here. He is wearing his juxta lite stocking on the left leg says there is nothing open 11/24/2020; 3 weeks follow-up. His left buttock still has the 3 open areas. We have been using polymen but due to lack of response he changed to [facility] area. Surrounding skin is dry erythematous and irritated looking. There is no evidence of infection either bacterial or fungal however there is loss of surface epithelium He still has very dry skin in his foot causing irritation and erythema on the dorsal part of his toes. This is not responded to prolonged courses of antifungal simply looks dry and irritated 1/24; left buttock area still looks about the same he was unable to find the triad ointment that we had suggested. The area on the right lower leg just above the dorsal ankle has reopened and the areas on the right foot between the first second and second third toes and scaling on the bottom of the foot has been about the same for quite some time now. been using silver alginate to all wound areas 2/7; left buttock wound looked quite good although not much smaller in terms of surface area surrounding skin looks better. Only a few dry flaking areas on the right foot in between the first and second toes the skin generally looks better here [ammonium lactate]. Finally the area on the right  dorsal ankle is closed 2/21; There is no open area on the right foot even between the right first and second toe. Skin around this area dorsally and plantar aspects look better. He has a reopening of the area on the right ankle just above the crease of the ankle dorsally. I continue to think that this is probably friction from spasms may be even this time with his stocking under the compression stockings. Wounds on his left buttock look about the same there a couple of areas that have reopened. He has a total square area of loss of epithelialization. This does not look like infection it looks like a contact dermatitis but I just cannot determine to what 3/14; there is nothing on the right foot between the first and second toes this was carefully inspected under illumination. Some chronic irritation on the dorsal part of his foot from toes 1-3 at the base. Nothing really open here substantially. Still has an area on the right foot/ankle that is actually larger and hyper granulated. His buttock area on the left is just about closed however he has chronic inflammation with loss of the surface epithelial layer 3/28; 2-week follow-up. In clinic today with a new wound on the left anterior mid tibia. Says this happened about 2 weeks ago. He is not really sure how wonders about the spasticity of his legs at night whether that could have caused this other than that he does not have a good idea. He has been using topical antibiotics and silver alginate. The area on his right dorsal ankle seems somewhat better. Finally everything on his left buttock is closed. 4/11; 2-week follow-up. All of his wounds are better except for the area over the ischium and left buttock which have opened up widely again. At  least part of this is covered in necrotic fibrinous material another part had rolled nonviable skin. The area on the right ankle, left anterior mid tibia are both a lot better. He had no open wounds on either  foot including the areas between the first and second toes 4/25; patient presents for 2-week follow-up. He states that the wounds are overall stable. He has no complaints today and states he is using Hydrofera Blue to open wounds. 5/9; have not seen this man in over a month. For my memory he has open areas on the left mid tibia and right ankle. T oday he has new open area on the right dorsal foot which we have not had a problem with recently. He has the sustained area on the left buttock He is also changed his insurance at the beginning of the year [employer]. We will need prior authorizations for debridement 5/23; patient presents for 2-week follow-up. He has prior authorizations for debridement. He denies any issues in the past 2 weeks with his wound care. He has been using Hydrofera Blue to all the wounds. He does report a circular rash to the upper left leg that is new. He denies acute signs of infection. 6/6; 2-week follow-up. The patient has open wounds on the left buttock which are worse than the last time I saw this about a month ago. He also has a new area to me on the left anterior mid tibia with some surrounding erythema. The area on the dorsal ankle on the right is closed but I think this will be a friction injury every time this area is exposed to either our wraps or his compression stockings caused by unrelenting spasms in this leg. 6/20; 2-week follow-up.  The patient has open wounds on the left buttock which is about the same. Using [facility] here. - The left mid tibia has a static amount of surrounding erythema. Also a raised area in the center. We have been using Hydrofera Blue here. Finally he has broken down in his dorsal right foot extending between the first and second toes and going to the base of the first and second toe webspace. I have previously assumed that this was severe venous hypertension 7/5; 2-week follow-up  The left buttock wound actually looks better. We  are using Hydrofera Blue. He has extensive skin irritation around this area and I have not really been able to get that any better. I have tried Lotrisone i.e. antifungals and steroids. More most recently we have just been using Coloplast really looks about the same.  The left mid tibia which was new last week culture to have very resistant staph aureus. Not only methicillin-resistant but doxycycline resistant. The patient has a plethora of antibiotic allergies including sulfa, linezolid. I used topical bacitracin on this but he has not started this yet.  In addition he has an expanding area of erythema with a wound on the dorsal right foot. I did a deep tissue culture of this area today 7/12;  Left buttock area actually looks better surrounding skin also looks less irritated.  Left mid tibia looks about the same. He is using bacitracin this is not worse  Right dorsal foot looks about the same as well.  The left first toe also looks about the same 7/19; left buttock wound continues to improve in terms of open areas  Left mid tibia is still concerning amount of swelling he is using bacitracin  Dorsal left first toe somewhat smaller  Right  dorsal foot somewhat smaller 7/25; left buttock wound actually continues to improve  Left mid tibia area has less swelling. I gave him all my samples of new Nuzyra. This seems to have helped although the wound is still open it. His abrasion closed by here  Left dorsal great toe really no better. Still a very nonviable surface  Right dorsal foot perhaps some better.  We have been using bacitracin and silver nitrate to the areas on his lower legs and Hydrofera Blue to the area on the buttock. 8/16  Disappointed that his left buttock wound is actually more substantial. Apparently during the last nurse visit these were both very small. He has continued Saling, Hulan E ([id number]) [id number] Page 32 of 39 irritation to a large area of  skin on his buttock. I have never been able to totally explain this although I think it some combination of the way he sits, pressure, moisture. He is not incontinent enough to contribute to this.  Left dorsal great toe still fibrinous debris on the surface that I have debrided today  Large area across the dorsal right toes.  The area on the left anterior mid tibia has less swelling. He completed the [place]. This does not look infected although the tissue is still fried 8/30; 2-week follow-up.  Left buttock areas not improved. We used Hydrofera Blue on this. Weeping wet with the surrounding erythema that I have not been able to control even with Lotrisone and topical Coloplast  Left dorsal great toe looks about the same  More substantial area again at the base of his toes on the left which is new this week.  Area across the dorsal right toes looks improved  The left anterior mid tibia looks like it is trying to close 9/13; 2-week follow-up. Using silver alginate on all of his wounds. The left dorsal foot does not look any better. He has the area on the dorsal toe and also the areas at the base of all of his toes 1 through 3. On the right foot he has a similar pattern in a similar area. He has the area on his left mid tibia that looks fairly healthy. Finally the area on his left buttock looks somewhat bette 9/20; culture I did of the left foot which was a deep tissue culture last time showed E. coli he has erythema around this wound. Still a completely necrotic surface. His right dorsal foot looks about the same. He has a very friable surface to the left anterior mid tibia. Both buttock wounds look better. We have been using silver alginate to all wounds 10/4; he has completed the cephalexin that I gave him last time for the left foot. He is using topical gentamicin under silver alginate silver alginate being applied to all the wounds. Unfortunately all the wounds look irritated on his dorsal right  foot dorsal left foot left mid tibia. I wonder if this could be a silver allergy. I am going to change him to [facility] on the lower extremity. The skin on the left buttock and left posterior thigh still flaking dry and irritated. This has continued no matter what I have applied topically to this. He has a solitary open wound which by itself does not look too bad however the entire area of surrounding skin does not change no matter what we have applied here 10/18; the area on the left dorsal foot and right dorsal foot both look better. The area on the right extends into the  plantar but not between his toes. We have been using silver alginate. He still has a rectangular erythematous area around the area on the left tibia. The wound itself is very small. Finally everything on his left buttock looks a little larger the skin is erythematous 11/15; patient comes in with the left dorsal and right dorsal foot distally looking somewhat better. Still nonviable surface on the left foot which required debridement. He still has the area on the left anterior mid tibia although this looks somewhat better. He has a new area on the right lateral lower leg just above the ankle. Finally his left buttock looks terrible with multiple superficial open wounds geometric square shaped area of chronic erythema which I have not been able to sort through 11/29; right dorsal foot and left dorsal foot both look somewhat better. No debridement required. He has the fragile area on the left anterior mid tibia this looks and continues to look somewhat better. Right lateral lower leg just above the ankle we identified last time also looks better. In general the area on his left buttock looks improved. We are using Hydrofera Blue to all wound areas 12/13; right dorsal foot looks better. The area on the right lateral leg is healed. Left dorsal foot has 2 open areas both of which require debridement. The fragile area on the left  anterior mid tibial looks better. Smaller area on his buttocks. Were using Hydrofera Blue 1/10; patient comes in with everything looking slightly larger and/or worse. This includes his left buttock, reopening of the left mid tibia, larger areas on the left dorsal foot and what looks to be a cellulitis on the right dorsal and plantar foot. We have been using Hydrofera Blue on all wounds. 1/17; right dorsal foot distally looks better today. The left foot has 2 open wounds that are about the same surrounding erythema. 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 No malignancy they did not stain for fungus 1/24; his right distal foot is not closed dry and scaly but the wound looks like it is contracting. I did not debride anything here. Problem on his left dorsal foot with expanding erythema. Apparently there were problems last week getting the Riki Altes however it is now available at the [facility] [facility] but a week later. He is using ketoconazole and Coloplast to the left buttock along with Hydrofera Blue this actually looks quite a bit better today. 1/31; right dorsal foot again is dry and scaly but looking to contract. He has been using a moisturizer on his feet at my request but he is not sure which 1. The left dorsal foot wounds look about the same there is erythema here that I marked last week however after course of Nuzyra it certainly is not any better but not any worse either. Finally on his left buttock the skin continues to look better he has the original wound but a new substantial area towards the gluteal cleft. Almost like a skin tear. I used scissors to remove skin and subcutaneous tissue here silver nitrate and direct pressure 2/7; right dorsal foot. This does not look too much different from last week. Some erythema skin dry and scaly. No debridement. Left  dorsal great toe again still not much improvement. I did remove flaking dry skin and callus from around the edge. Finally on his left buttock. The skin is somewhat better in the periwound.  Surface wounds are superficial somewhat better than last week. 01/26/2022: Is a little bit of a mystery as to why his wounds fail to respond to treatments and actually seem to get worse. This is my first encounter with this patient. He was previously followed by Dr. Leanord Hawking. Based upon my review of the chart, it seems that there is a little bit of a mystery as to why his wounds do not respond as anticipated to the interventions applied and sometimes even get worse. Biopsies have been performed and he was seen by dermatology in [place], but that did not shed any light on the matter. T oday, his gluteal wound is larger, with substantial drainage, rather malodorous. The food wounds are not terrible, but he has a lot of callus and scaly skin around these. He is currently getting silver alginate on the gluteal wound, with idodoflex to the feet. He is using lotrisone on his legs for the dry, scaly skin. 02/09/2022: There has really been no change to any of his wounds. The gluteal wound less drainage and odor, but remains about the same size, the periwound skin remains oddly scaly. His lower extremity wounds also appear roughly the same size. They continue to accumulate a small amount of slough. The periwound on his feet and ankle wounds has dry eschar and loose dead skin. We have been using silver alginate on the gluteal wound and Iodoflex on his feet and ankle wounds. T the periwound around his gluteal lesion and Lotrisone on his feet and legs. o 02/23/2022: The right plantar foot wound is closed. The gluteal site looks small but has continued to produce hypertrophic granulation tissue. The foot wounds all look about the same on the dorsal surface of the right foot; on the left, there is only a small open area at the site of  where his left great toenail would have been. 03/16/2022: The right ankle wound is healed. The right dorsal foot wound is about the same. The left dorsal foot wound is quite a bit smaller and the ischial wound is nearly closed. 03/30/2022: The right ankle wound reopened. Both dorsal foot wounds are quite a bit smaller. Unfortunately, he appears to have sheared part of his ischial wound open further, perhaps during a transfer. 04/13/2022: The right ankle wound has hypertrophic granulation tissue present. The dorsal foot wounds continue to decrease in size. The ischial wound looks about the same today, no better, no worse. 04/27/2022: The right ankle wound has closed. Unfortunately, it looks like some moisture got underneath the dry skin on both of his dorsal feet and these wounds have expanded in size. The ischial wound remains the same with perhaps a little bit more slough accumulation than at our previous visit. 05/11/2022: The right ankle wound remains closed. There is a left anterior tibial wound that is small has patchy openings with accumulated slough. The dorsum of his right foot appears to be nearly healed with just a small punctate opening. The plantar surface of his right foot has a new opening that looks like he may Wixom, Nadir E ([id number]) [phone].pdf Page 33 of 39 have picked some skin there. His sacral ulcer has hypertrophic granulation tissue but has some slough accumulation. The dorsum of his left foot has multiple open areas in a fairly ragged distribution. All of these have slough accumulated within them. 06/01/2022: The right ankle and left anterior tibial wound are both closed. Dorsum of his right foot and left foot both look substantially better with just tiny  scattered openings The without any slough accumulation. He has sheared open new areas on his left gluteus and ischium. He says that his wheelchair cushion, which is air-filled, has a leak and so it  keeps deflating. He is awaiting a new cushion. 06/15/2022: The right ankle wound has reopened and the fat layer is exposed. Both dorsal feet have just small openings with just a little bit of slough and eschar accumulation. The wound on his left gluteus and ischium is larger again today and has a foul odor. 06/29/2022: The right ankle wound has hypertrophic granulation tissue buildup. His dorsal foot wounds both look better with just some eschar on the surface. He has a new wound on his left lateral ankle. He is not sure how he acquired it but by appearance, it looks that he hit it on something, potentially his wheelchair or bed. The ischial wound is about the same but is cleaner without any significant purulent drainage or odor. He did not understand what the [facility] call was about and therefore he does not have the topical compounded antibiotic. 07/13/2022: The right ankle wound again has hypertrophic granulation tissue, but less so than at his previous visit. The ischial wound has improved tremendously the use of the [facility] topical antibiotic. No significant change to the left lateral ankle wound; it is fibrotic with slough present. The skin of both of his feet, especially on the right, has a yeasty appearance. 08/10/2022: There is again hypertrophic granulation tissue on the right anterior ankle wound. Both feet are about the same. The left lateral ankle wound is a little bit desiccated and has some slough buildup. He unfortunately suffered a new injury when he was removing his pants and they caught his bandage which caused a large skin tear on his left ischium, just distal to the existing wound. The existing left ischial wound, however, is significantly better with just a little light slough on the surface. 08/31/2022: The right anterior ankle wound is a lot smaller today underneath some eschar. No accumulation of hypertrophic granulation tissue. The left lateral ankle wound has some slough on the  surface but is better in terms of moisture balance this week. The left dorsal foot does not really have any openings on it today. The right dorsal foot has some slough and eschar accumulation. His gluteal ulcer is basically closed aside from 2 small areas that are oozing a bit. 09/21/2022: The right anterior ankle wound is down to just a tiny pinhole. The left lateral ankle wound has accumulated slough again, but moisture balance is good. Left and right dorsal feet both have 2 small openings with some slough in them. The gluteal ulcer, unfortunately has opened up substantially. 10/05/2022: The right anterior ankle wound has reopened and has a fair amount of slough on the surface. The left lateral ankle wound has accumulated more slough, as well. He was approved for Apligraf, but the wound is not clean enough yet. There is some slough buildup on the dorsal right foot wound, as well as on his ischium. 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 10/26/2022: The right anterior ankle wound has closed again. The left lateral ankle wound looks quite a bit better. It is ready for Apligraf but we do not have 1 here for him. His ischium continues to get worse, with the wound larger and deeper. It really seems like this is a combination of pressure and friction. He has  2 new wounds, 1 on the plantar aspect of each foot. He says that he had some dry skin there that peeled away. Both are superficial. The dorsum of his left foot does not have any new wound opening. The dorsum of his right foot has a bit of slough accumulation. 11/24/2022: The right anterior ankle wound has 2 small open areas, both covered with eschar. The left lateral ankle wound has closed and considerably with just a little slough on the surface. The ischium has improved most significantly, having closed in most of the open area with just a few small remaining superficial openings. The dorsal  foot wounds are small and superficial. The bottoms of his feet are closed. 12/07/2022: The right anterior ankle wound remains open with 2 small areas, again with slough and eschar present. The left lateral ankle wound is down to about half a centimeter with just some slough on the surface. His right dorsal foot has opened up more and has both slough and eschar present. The ischium has continued to improve and now just has 2 small open areas that are very clean. 12/21/2022: He has a new wound on his right lateral ankle. There is some slough present. The left lateral ankle is quite a bit smaller with some slough buildup. His left ischial ulcer is also nearly closed; there is just a tear in his skin that seems likely to be secondary to a transfer mishap. He has a new ulcer in his natal cleft that looks like it may be secondary to moisture. There is some slough in this area as well. The right anterior ankle is nearly closed. The right dorsal foot wound has some slough accumulation 01/04/2023: The anterior right ankle wound is closed, as is his ischial ulcer. The left lateral ankle wound is nearly closed with just a little bit of slough accumulation. The ulcer in his natal cleft is about the same size with slough buildup there, as well. He has a new wound on his left lateral leg near the knee from rubbing on his wheelchair. There is slough present, but no signs of infection. The right dorsal foot wound has a fairly heavy layer of slough buildup, as well. 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 He has been using his old wheelchair cushion for reasons that are unclear. The ulcer in his natal cleft has healed. The wound on his left lateral leg near the knee is smaller with some slough present. The right dorsal foot wound has accumulated slough again. The right lateral ankle wound is healed. The left lateral ankle wound is smaller again this week. 02/01/2023: His ischial ulcer has had a ton of  drainage over the last 2 weeks and the drainage has an absolutely putrid odor. He has gone back to using his new wheelchair chair cushion, though. The wound on his left lateral leg near his knee has healed. The left lateral ankle wound is nearly healed. Both the dorsal foot wounds are larger and have a lot of slough accumulation and drainage. The wound on his right anterior ankle is healed but there is a new 1 just proximal to this, also with slough and eschar present. 02/15/2023:The left lateral and right lateral ankle wounds are both smaller. The right anterior ankle wound has some eschar on the surface. Both dorsal feet have a lot of slough accumulation. The ischial ulcer has epithelialized in multiple areas, leaving several smaller areas that are quite friable and still with a pungent odor. His  new [facility] prescription should be delivered tomorrow. We are using silver alginate on everything at this point. 03/08/2023: Both dorsal foot wounds are about the same size and have a lot of slough buildup. The ischial ulcer has improved quite a bit. The odor has abated and the surfaces are clean. It is still quite friable and 1 area is oozing. The left lateral and right lateral ankle wounds are nearly closed. The right anterior ankle wound is healed. 03/22/2023: The right lateral ankle wound is healed. The left lateral ankle wound is tiny with just a little bit of slough accumulation. The dorsal foot wounds look about the same. The ischial ulcer is considerably smaller and there is no odor. There is slight slough accumulation. 04/14/2023: The right lateral ankle wound has reopened. It is fairly superficial but does have some hematoma in the tissue suggesting trauma or pressure as the etiology. The left lateral ankle wound is essentially a pinhole under some dry eschar. The dorsal foot wounds are stable. The ischial ulcer, unfortunately, has expanded to 5 separate openings in his skin. The wounds are clean but  friable. 04/28/2023: The left lateral ankle wound has expanded and is a little bit deeper today. Dorsal foot wounds are stable. The ischial ulcer is less friable and does not have the strong odor that has been present. The right lateral ankle wound is a little bit smaller with some slough and eschar accumulation. 05/10/2023: The total area of the ischial wound is smaller. The left lateral ankle wound is nearly closed. Both dorsal foot wounds are quite a bit smaller. The right ankle wound has healed. 05/31/2023: The left lateral leg wound is down to just a superficial small area that almost looks like a scrape rather than the deeper ulcer it has been. His ischial ulcer is down to just 1 open spot. It is clean but quite friable. Both dorsal foot ulcers are considerably smaller and have much less slough than usual. 06/21/2023: His ischial ulcer is still just 1 open area, but it is a little larger today. The surrounding skin, however, is in better condition Unfortunately, the left lateral leg wound is a little bit larger today. It looks as though some dry skin was peeled away. Both dorsal foot ulcers continue to contract; they are the Burgo, Morty E ([id number]) [phone].pdf Page 34 of 39 smallest I have ever seen them and have only minimal slough on the surface. 07/12/2023: All of his wounds have deteriorated and his right anterior ankle ulcer is open again. He has had a lot more drainage, although he states it is clear and does not have any significant odor. This is resulted in breakdown around the left lateral ankle ulcer, both dorsal feet and has extended to the plantar surface of both feet. His ischial ulcer is also larger; this appears to be more due to shear than anything else. 08/02/2023: All of his wounds look better. There does not appear to be any rhyme or reason as to why they wax and wane as they do. 08/23/2023: The right dorsal ankle wound is covered with a extremely thin  layer of skin but is still purple underneath. The left lateral ankle wound has quite a bit of slough on it and it appears that the tendon is exposed again. His ischial ulcer is down to 4 open areas, all of which are smaller than previously, but the surfaces remain friable and have some slough on them today. The dorsal foot wounds are both considerably smaller and  the right plantar ulcer is closed. The left plantar ulcer is nearly closed. 09/26/2023: The right dorsal ankle wound is basically healed. The left lateral ankle wound is smaller. His ischial ulcers have merged and are a bit larger today. Both dorsal foot wounds are stable. The left plantar ulcer is closed but the right plantar ulcer has reopened. Patient History Information obtained from Patient. Family History Diabetes - Father, Hypertension - Mother, No family history of Cancer, Heart Disease, Hereditary Spherocytosis, Kidney Disease, Lung Disease, Stroke, Thyroid Problems, Tuberculosis. Social History Former smoker, Marital Status - Married, Alcohol Use - Moderate, Drug Use - No History, Caffeine Use - Daily. Medical History Ear/Nose/Mouth/Throat Denies history of Chronic sinus problems/congestion, Middle ear problems Hematologic/Lymphatic Denies history of Anemia, Hemophilia, Human Immunodeficiency Virus, Lymphedema, Sickle Cell Disease Respiratory Patient has history of Sleep Apnea - does not tolerate cpap Denies history of Aspiration, Asthma, Chronic Obstructive Pulmonary Disease (COPD), Pneumothorax, Tuberculosis Cardiovascular Patient has history of Hypertension - lisinopril HCTZ Denies history of Angina, Arrhythmia, Congestive Heart Failure, Coronary Artery Disease, Deep Vein Thrombosis, Hypotension, Myocardial Infarction, Peripheral Arterial Disease, Peripheral Venous Disease, Phlebitis, Vasculitis Gastrointestinal Denies history of Cirrhosis , Colitis, Crohns, Hepatitis A, Hepatitis B, Hepatitis C Endocrine Denies  history of Type I Diabetes, Type II Diabetes Genitourinary Denies history of End Stage Renal Disease Immunological Denies history of Lupus Erythematosus, Raynauds, Scleroderma Integumentary (Skin) Denies history of History of Burn Musculoskeletal Denies history of Gout, Rheumatoid Arthritis, Osteoarthritis, Osteomyelitis Neurologic Patient has history of Paraplegia Denies history of Dementia, Neuropathy, Quadriplegia, Seizure Disorder Oncologic Denies history of Received Chemotherapy, Received Radiation Psychiatric Denies history of Anorexia/bulimia, Confinement Anxiety Hospitalization/Surgery History - cellulitis in leg. - left leg vein ablation. Objective Constitutional Tachycardic, asymptomatic. no acute distress. Vitals Time Taken: 7:46 AM, Height: 70 in, Weight: 216 lbs, BMI: 31, Temperature: 98.1 F, Pulse: 128 bpm, Respiratory Rate: 18 breaths/min, Blood Pressure: 132/78 mmHg. Respiratory Normal work of breathing on room air.. General Notes: 09/26/2023: The right dorsal ankle wound is basically healed. The left lateral ankle wound is smaller. His ischial ulcers have merged and are a bit larger today. Both dorsal foot wounds are stable. The left plantar ulcer is closed but the right plantar ulcer has reopened. Integumentary (Hair, Skin) Wound #52 status is Open. Original cause of wound was Gradually Appeared. The date acquired was: 03/27/2021. The wound has been in treatment 130 weeks. The wound is located on the Right,Dorsal Foot. The wound measures 1cm length x 4.4cm width x 0.1cm depth; 3.456cm^2 area and 0.346cm^3 volume. There is Fat Layer (Subcutaneous Tissue) exposed. There is no tunneling or undermining noted. There is a medium amount of serous drainage noted. The wound Kelemen, Daltyn E ([id number]) [id number] Page 35 of 39 margin is distinct with the outline attached to the wound base. There is small (1-33%) red granulation within the wound bed.  There is a large (67-100%) amount of necrotic tissue within the wound bed including Adherent Slough. The periwound skin appearance had no abnormalities noted for texture. The periwound skin appearance exhibited: Dry/Scaly. The periwound skin appearance did not exhibit: Maceration, Rubor. Periwound temperature was noted as No Abnormality. Wound #56 status is Open. Original cause of wound was Gradually Appeared. The date acquired was: 07/11/2021. The wound has been in treatment 113 weeks. The wound is located on the Left,Dorsal Foot. The wound measures 2cm length x 2.4cm width x 0.1cm depth; 3.77cm^2 area and 0.377cm^3 volume. There is Fat Layer (Subcutaneous Tissue) exposed. There is no tunneling or undermining  noted. There is a medium amount of serosanguineous drainage noted. The wound margin is flat and intact. There is medium (34-66%) red granulation within the wound bed. There is a medium (34-66%) amount of necrotic tissue within the wound bed including Adherent Slough. The periwound skin appearance had no abnormalities noted for texture. The periwound skin appearance exhibited: Dry/Scaly, Rubor. The periwound skin appearance did not exhibit: Maceration, Erythema. Periwound temperature was noted as No Abnormality. Wound #67 status is Open. Original cause of wound was Gradually Appeared. The date acquired was: 06/22/2022. The wound has been in treatment 64 weeks. The wound is located on the Left,Lateral Lower Leg. The wound measures 1.8cm length x 0.8cm width x 0.1cm depth; 1.131cm^2 area and 0.113cm^3 volume. There is Fat Layer (Subcutaneous Tissue) exposed. There is no tunneling or undermining noted. There is a medium amount of serous drainage noted. The wound margin is distinct with the outline attached to the wound base. There is no granulation within the wound bed. There is a large (67-100%) amount of necrotic tissue within the wound bed including Adherent Slough. The periwound skin appearance had  no abnormalities noted for color. The periwound skin appearance exhibited: Scarring, Dry/Scaly. Periwound temperature was noted as No Abnormality. Wound #74 status is Open. Original cause of wound was Pressure Injury. The date acquired was: 01/10/2023. The wound has been in treatment 35 weeks. The wound is located on the Left,Posterior Upper Leg. The wound measures 12cm length x 6.2cm width x 0.1cm depth; 58.434cm^2 area and 5.843cm^3 volume. There is Fat Layer (Subcutaneous Tissue) exposed. There is no tunneling or undermining noted. There is a large amount of purulent drainage noted. Foul odor after cleansing was noted. The wound margin is flat and intact. There is large (67-100%) red, friable, hyper - granulation within the wound bed. There is no necrotic tissue within the wound bed. The periwound skin appearance had no abnormalities noted for color. The periwound skin appearance exhibited: Scarring, Dry/Scaly. Periwound temperature was noted as No Abnormality. Wound #78 status is Open. Original cause of wound was Gradually Appeared. The date acquired was: 07/04/2023. The wound has been in treatment 10 weeks. The wound is located on the Right,Anterior Ankle. The wound measures 0.1cm length x 0.1cm width x 0.1cm depth; 0.008cm^2 area and 0.001cm^3 volume. There is no tunneling or undermining noted. There is a none present amount of drainage noted. The wound margin is flat and intact. There is no granulation within the wound bed. There is no necrotic tissue within the wound bed. The periwound skin appearance had no abnormalities noted for texture. The periwound skin appearance had no abnormalities noted for moisture. The periwound skin appearance had no abnormalities noted for color. Periwound temperature was noted as No Abnormality. Wound #79 status is Healed - Epithelialized. Original cause of wound was Gradually Appeared. The date acquired was: 07/04/2023. The wound has been in treatment 10 weeks.  The wound is located on the Left,Plantar Foot. The wound measures 0cm length x 0cm width x 0cm depth; 0cm^2 area and 0cm^3 volume. There is no tunneling or undermining noted. There is a none present amount of drainage noted. There is no granulation within the wound bed. There is no necrotic tissue within the wound bed. The periwound skin appearance had no abnormalities noted for texture. The periwound skin appearance had no abnormalities noted for color. The periwound skin appearance exhibited: Dry/Scaly. The periwound skin appearance did not exhibit: Maceration. Periwound temperature was noted as No Abnormality. Wound #81 status is Open.  Original cause of wound was Gradually Appeared. The date acquired was: 09/23/2023. The wound is located on the Right,Plantar Foot. The wound measures 0.5cm length x 1.4cm width x 0.1cm depth; 0.55cm^2 area and 0.055cm^3 volume. There is Fat Layer (Subcutaneous Tissue) exposed. There is no tunneling or undermining noted. There is a medium amount of serosanguineous drainage noted. Foul odor after cleansing was noted. The wound margin is flat and intact. There is no granulation within the wound bed. There is a large (67-100%) amount of necrotic tissue within the wound bed including Adherent Slough. The periwound skin appearance had no abnormalities noted for texture. The periwound skin appearance had no abnormalities noted for color. The periwound skin appearance exhibited: Maceration. 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 There was a Excisional Skin/Subcutaneous Tissue Debridement with a total area of 3.45 sq cm performed by Duanne Guess, MD. With the following instrument(s): Curette to remove Non-Viable tissue/material. Material removed includes Subcutaneous Tissue and Slough and after achieving pain control using Lidocaine 4% T opical Solution. No specimens were taken. A time out was conducted at 08:20, prior to the start of the procedure. A Minimum amount of bleeding was controlled with Pressure. The procedure was tolerated well with a pain level of Insensate throughout and a pain level of Insensate following the procedure. Post Debridement Measurements: 1cm length x 4.4cm width x 0.1cm depth; 0.346cm^3 volume. Character of Wound/Ulcer Post Debridement is improved. Severity of Tissue Post Debridement is: Fat layer exposed. 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 There was a Excisional Skin/Subcutaneous Tissue Debridement with a total area of 3.77 sq cm performed by Duanne Guess, MD. With the following instrument(s): Curette to remove Non-Viable tissue/material. Material removed includes Subcutaneous Tissue and Slough and after achieving pain control using Lidocaine 4% T opical Solution. No specimens were taken. A time out was conducted at 08:20, prior to the start of the procedure. A Minimum amount of bleeding was controlled with Pressure. The procedure was tolerated well with a pain level of Insensate throughout and a pain level of Insensate following the procedure. Post Debridement Measurements: 2cm length x 2.4cm width x 0.1cm depth; 0.377cm^3 volume. Character of Wound/Ulcer Post Debridement is improved. Post procedure Diagnosis Wound #56: Same as Pre-Procedure Wound #67 Cosper, Youcef E ([id number]) [id number] Page 36 of  39 Pre-procedure diagnosis of Wound #67 is a Venous Leg Ulcer located on the Left,Lateral Lower Leg .Severity of Tissue Pre Debridement is: Fat layer exposed. There was a Excisional Skin/Subcutaneous Tissue Debridement with a total area of 1.13 sq cm performed by Duanne Guess, MD. With the following instrument(s): Curette to remove Non-Viable tissue/material. Material removed includes Subcutaneous Tissue and Slough and after achieving pain control using Lidocaine 4% T opical Solution. No specimens were taken. A time out was conducted at 08:20, prior to the start of the procedure. A Minimum amount of bleeding was controlled with Pressure. The procedure was tolerated well with a pain level of Insensate throughout and a pain level of  Insensate following the procedure. Post Debridement Measurements: 1.8cm length x 0.8cm width x 0.1cm depth; 0.113cm^3 volume. Character of Wound/Ulcer Post Debridement is improved. Severity of Tissue Post Debridement is: Fat layer exposed. Post procedure Diagnosis Wound #67: Same as Pre-Procedure Wound #74 Pre-procedure diagnosis of Wound #74 is a Pressure Ulcer located on the Left,Posterior Upper Leg . There was a Excisional Skin/Subcutaneous Tissue Debridement with a total area of 2.92 sq cm performed by Duanne Guess, MD. With the following instrument(s): Curette to remove Non-Viable tissue/material. Material removed includes Subcutaneous Tissue and Slough and after achieving pain control using Lidocaine 4% T opical Solution. No specimens were taken. A time out was conducted at 08:20, prior to the start of the procedure. A Minimum amount of bleeding was controlled with Pressure. The procedure was tolerated well with a pain level of Insensate throughout and a pain level of Insensate following the procedure. Post Debridement Measurements: 12cm length x 6.2cm width x 0.1cm depth; 5.843cm^3 volume. Post debridement Stage noted as Category/Stage III. Character of  Wound/Ulcer Post Debridement is improved. Post procedure Diagnosis Wound #74: Same as Pre-Procedure Wound #81 Pre-procedure diagnosis of Wound #81 is a Lymphedema located on the Right,Plantar Foot . There was a Excisional Skin/Subcutaneous Tissue Debridement with a total area of 0.27 sq cm performed by Duanne Guess, MD. With the following instrument(s): Curette to remove Non-Viable tissue/material. Material removed includes Subcutaneous Tissue and Slough and after achieving pain control using Lidocaine 4% T opical Solution. No specimens were taken. A time out was conducted at 08:20, prior to the start of the procedure. A Minimum amount of bleeding was controlled with Pressure. The procedure was tolerated well with a pain level of Insensate throughout and a pain level of Insensate following the procedure. Post Debridement Measurements: 0.5cm length x 1.4cm width x 0.1cm depth; 0.055cm^3 volume. Character of Wound/Ulcer Post Debridement is improved. Post procedure Diagnosis Wound #81: Same as Pre-Procedure Plan Follow-up Appointments: Return appointment in 1 month. - Dr. Lady Gary RM 1 ***allow extra time*** Tues. Dec 3 @ 08:15 am Anesthetic: Wound #52 Right,Dorsal Foot: (In clinic) Topical Lidocaine 4% applied to wound bed Bathing/ Shower/ Hygiene: May shower and wash wound with soap and water. - on days that dressing is changed Off-Loading: Roho cushion for wheelchair - use newer cushion Turn and reposition every 2 hours - lift up with arms in wheelchair every hour during the day WOUND #52: - Foot Wound Laterality: Dorsal, Right Cleanser: Soap and Water Every Other Day/30 Days Discharge Instructions: May shower and wash wound with dial antibacterial soap and water prior to dressing change. Cleanser: Wound Cleanser Every Other Day/30 Days Discharge Instructions: Cleanse the wound with wound cleanser prior to applying a clean dressing using gauze sponges, not tissue or cotton  balls. Peri-Wound Care: Sween Lotion (Moisturizing lotion) Every Other Day/30 Days Discharge Instructions: Apply Aquaphor moisturizing lotion as directed Topical: Mupirocin Ointment Every Other Day/30 Days Discharge Instructions: Apply Mupirocin (Bactroban) as instructed Prim Dressing: Maxorb Extra Ag+ Alginate Dressing, 2x2 (in/in) Every Other Day/30 Days ary Discharge Instructions: Apply to wound bed as instructed Secondary Dressing: Woven Gauze Sponge, Non-Sterile 4x4 in Every Other Day/30 Days Discharge Instructions: Apply over primary dressing as directed. Secured With: [employer], 4.5x3.1 (in/yd) (Generic) Every Other Day/30 Days Discharge Instructions: Secure with Kerlix as directed. Secured With: 45M Medipore H Soft Cloth Surgical T ape, 4 x 10 (in/yd) (Generic) Every Other Day/30 Days Discharge Instructions: Secure with tape as directed. WOUND #56: - Foot Wound Laterality: Dorsal,  Left Cleanser: Soap and Water Every Other Day/30 Days Discharge Instructions: May shower and wash wound with dial antibacterial soap and water prior to dressing change. Cleanser: Wound Cleanser Every Other Day/30 Days Discharge Instructions: Cleanse the wound with wound cleanser prior to applying a clean dressing using gauze sponges, not tissue or cotton balls. Peri-Wound Care: Sween Lotion (Moisturizing lotion) Every Other Day/30 Days Discharge Instructions: Apply Aquaphor moisturizing lotion as directed Topical: Mupirocin Ointment Every Other Day/30 Days Discharge Instructions: Apply Mupirocin (Bactroban) as instructed Prim Dressing: Maxorb Extra Ag+ Alginate Dressing, 2x2 (in/in) Every Other Day/30 Days ary Discharge Instructions: Apply to wound bed as instructed Secondary Dressing: Woven Gauze Sponge, Non-Sterile 4x4 in Every Other Day/30 Days Discharge Instructions: Apply over primary dressing as directed. Secured With: [employer], 4.5x3.1 (in/yd) (Generic) Every Other Day/30  Days Discharge Instructions: Secure with Kerlix as directed. Secured With: 48M Medipore H Soft Cloth Surgical T ape, 4 x 10 (in/yd) (Generic) Every Other Day/30 Days Discharge Instructions: Secure with tape as directed. WOUND #67: - Lower Leg Wound Laterality: Left, Lateral Cleanser: Soap and Water Every Other Day/30 Days Discharge Instructions: May shower and wash wound with dial antibacterial soap and water prior to dressing change. Cleanser: Wound Cleanser Every Other Day/30 Days Discharge Instructions: Cleanse the wound with wound cleanser prior to applying a clean dressing using gauze sponges, not tissue or cotton balls. Peri-Wound Care: Sween Lotion (Moisturizing lotion) Every Other Day/30 Days Discharge Instructions: Apply Aquaphor moisturizing lotion as directed Topical: Mupirocin Ointment Every Other Day/30 Days Discharge Instructions: Apply Mupirocin (Bactroban) as instructed Rasnic, Herchel E ([id number]) [id number] Page 37 of 39 Prim Dressing: Maxorb Extra Ag+ Alginate Dressing, 2x2 (in/in) Every Other Day/30 Days ary Discharge Instructions: Apply to wound bed as instructed Secondary Dressing: Woven Gauze Sponge, Non-Sterile 4x4 in Every Other Day/30 Days Discharge Instructions: Apply over primary dressing as directed. Secured With: [employer], 4.5x3.1 (in/yd) (Generic) Every Other Day/30 Days Discharge Instructions: Secure with Kerlix as directed. Secured With: 48M Medipore H Soft Cloth Surgical T ape, 4 x 10 (in/yd) (Generic) Every Other Day/30 Days Discharge Instructions: Secure with tape as directed. WOUND #74: - Upper Leg Wound Laterality: Left, Posterior Cleanser: Vashe 5.8 (oz) 1 x Per Day/30 Days Discharge Instructions: Cleanse the wound with Vashe prior to applying a clean dressing using gauze sponges , let sit on wound for 10 minutes Peri-Wound Care: Zinc Oxide Ointment 30g tube 1 x Per Day/30 Days Discharge Instructions: Apply Zinc  Oxide to periwound with each dressing change as needed fpr moisture Topical: Gentamicin 1 x Per Day/30 Days Discharge Instructions: in clinic if [facility] not available Topical: Keystone antibiotic compound 1 x Per Day/30 Days Prim Dressing: Maxorb Extra Ag+ Alginate Dressing, 4x4.75 (in/in) 1 x Per Day/30 Days ary Discharge Instructions: Apply to wound bed as instructed Secondary Dressing: Zetuvit Plus Silicone Border Sacrum Dressing, Sm, 7x7 (in/in) 1 x Per Day/30 Days Discharge Instructions: Apply silicone border over primary dressing as directed. Secured With: 48M Medipore H Soft Cloth Surgical T ape, 4 x 10 (in/yd) 1 x Per Day/30 Days Discharge Instructions: Secure with tape as directed. WOUND #78: - Ankle Wound Laterality: Right, Anterior Cleanser: Soap and Water Every Other Day/30 Days Discharge Instructions: May shower and wash wound with dial antibacterial soap and water prior to dressing change. Cleanser: Wound Cleanser Every Other Day/30 Days Discharge Instructions: Cleanse the wound with wound cleanser prior to applying a clean dressing using gauze sponges, not tissue or cotton balls. Peri-Wound Care: Manuela Schwartz  Lotion (Moisturizing lotion) Every Other Day/30 Days Discharge Instructions: Apply Aquaphor moisturizing lotion as directed Topical: Mupirocin Ointment Every Other Day/30 Days Discharge Instructions: Apply Mupirocin (Bactroban) as instructed Prim Dressing: Maxorb Extra Ag+ Alginate Dressing, 2x2 (in/in) Every Other Day/30 Days ary Discharge Instructions: Apply to wound bed as instructed Secondary Dressing: Woven Gauze Sponge, Non-Sterile 4x4 in Every Other Day/30 Days Discharge Instructions: Apply over primary dressing as directed. Secured With: [employer], 4.5x3.1 (in/yd) (Generic) Every Other Day/30 Days Discharge Instructions: Secure with Kerlix as directed. Secured With: 46M Medipore H Soft Cloth Surgical T ape, 4 x 10 (in/yd) (Generic) Every Other Day/30  Days Discharge Instructions: Secure with tape as directed. 09/26/2023: The right dorsal ankle wound is basically healed. The left lateral ankle wound is smaller. His ischial ulcers have merged and are a bit larger today. Both dorsal foot wounds are stable. The left plantar ulcer is closed but the right plantar ulcer has reopened. I used a curette to debride slough and subcutaneous tissue from all the open wounds. The ischial wounds are quite friable and I only debrided a small portion of them before stopping secondary to the friability. He will continue to use his Keystone topical antibiotic compound (prescription drug) on his ischial ulcer until he runs out and then switch to mupirocin. He will continue mupirocin and silver alginate to all of his wounds. Follow-up in 3 to 4 weeks. Electronic Signature(s) Signed: 09/26/2023 8:40:18 AM By: Duanne Guess MD FACS Entered By: Duanne Guess on 09/26/2023 05:40:18 -------------------------------------------------------------------------------- HxROS Details Patient Name: Date of Service: [place], [name] 09/26/2023 7:30 A M Medical Record Number: [id number] Patient Account Number: [id number] Date of Birth/Sex: Treating RN: [birth year] (38 y.o. M) Primary Care Provider: [name], [name] Other Clinician: Referring Provider: Treating Provider/Extender: Fredderick Severance [name]: 403 Information Obtained From Patient Ear/Nose/Mouth/Throat Medical History: Negative for: Chronic sinus problems/congestion; Middle ear problems Hematologic/Lymphatic Medical History: Baig, Alexandar E ([id number]) [id number] Page 38 of 39 Negative for: Anemia; Hemophilia; Human Immunodeficiency Virus; Lymphedema; Sickle Cell Disease Respiratory Medical History: Positive for: Sleep Apnea - does not tolerate cpap Negative for: Aspiration; Asthma; Chronic Obstructive Pulmonary Disease (COPD); Pneumothorax;  Tuberculosis Cardiovascular Medical History: Positive for: Hypertension - lisinopril HCTZ Negative for: Angina; Arrhythmia; Congestive Heart Failure; Coronary Artery Disease; Deep Vein Thrombosis; Hypotension; Myocardial Infarction; Peripheral Arterial Disease; Peripheral Venous Disease; Phlebitis; Vasculitis Gastrointestinal Medical History: Negative for: Cirrhosis ; Colitis; Crohns; Hepatitis A; Hepatitis B; Hepatitis C Endocrine Medical History: Negative for: Type I Diabetes; Type II Diabetes Genitourinary Medical History: Negative for: End Stage Renal Disease Immunological Medical History: Negative for: Lupus Erythematosus; Raynauds; Scleroderma Integumentary (Skin) Medical History: Negative for: History of Burn Musculoskeletal Medical History: Negative for: Gout; Rheumatoid Arthritis; Osteoarthritis; Osteomyelitis Neurologic Medical History: Positive for: Paraplegia Negative for: Dementia; Neuropathy; Quadriplegia; Seizure Disorder Oncologic Medical History: Negative for: Received Chemotherapy; Received Radiation Psychiatric Medical History: Negative for: Anorexia/bulimia; Confinement Anxiety Immunizations Pneumococcal Vaccine: Received Pneumococcal Vaccination: No Immunization Notes: doesn't remember when last tetanus shot was Implantable Devices No devices added Hospitalization / Surgery History Type of Hospitalization/Surgery cellulitis in leg left leg vein ablation Family and Social History Cancer: No; Diabetes: Yes - Father; Heart Disease: No; Hereditary Spherocytosis: No; Hypertension: Yes - Mother; Kidney Disease: No; Lung Disease: No; Yankovich, Williamson E ([id number]) [id number] Page 56 of 39 Stroke: No; Thyroid Problems: No; Tuberculosis: No; Former smoker; Marital Status - Married; Alcohol Use: Moderate; Drug Use: No History; Caffeine Use: Daily; Financial Concerns: No; Food, [employer] fast streamer or Shelter  Needs: No; Support System Lacking:  No; Transportation Concerns: No Electronic Signature(s) Signed: 09/26/2023 9:23:45 AM By: Duanne Guess MD FACS Entered By: Duanne Guess on 09/26/2023 05:38:15 -------------------------------------------------------------------------------- SuperBill Details Patient Name: Date of Service: [place], [name] 09/26/2023 Medical Record Number: [id number] Patient Account Number: [id number] Date of Birth/Sex: Treating RN: 08/12/1986 (36 y.o. M) Primary Care Provider: [name], [name] Other Clinician: Referring Provider: Treating Provider/Extender: Benjamine Mola, [name]: 403 Diagnosis Coding ICD-10 Codes Code Description [phone] Non-pressure chronic ulcer of other part of left foot with fat layer exposed L97.512 Non-pressure chronic ulcer of other part of right foot with fat layer exposed L89.323 Pressure ulcer of left buttock, stage 3 L97.322 Non-pressure chronic ulcer of left ankle with fat layer exposed G82.21 Paraplegia, complete Facility Procedures : CPT4 Code: [id number] Description: 11042 - DEB SUBQ TISSUE 20 SQ CM/< ICD-10 Diagnosis Description L97.522 Non-pressure chronic ulcer of other part of left foot with fat layer exposed L97.512 Non-pressure chronic ulcer of other part of right foot with fat layer exposed  L89.323 Pressure ulcer of left buttock, stage 3 L97.322 Non-pressure chronic ulcer of left ankle with fat layer exposed Modifier: Quantity: 1 Physician Procedures : CPT4 Code Description Modifier [id number] - WC PHYS LEVEL 4 - EST PT ICD-10 Diagnosis Description L97.522 Non-pressure chronic ulcer of other part of left foot with fat layer exposed L97.512 Non-pressure chronic ulcer of other part of right foot  with fat layer exposed L89.323 Pressure ulcer of left buttock, stage 3 L97.322 Non-pressure chronic ulcer of left ankle with fat layer exposed Quantity: 1 : [id number] - WC PHYS SUBQ TISS 20 SQ CM ICD-10 Diagnosis Description L97.522  Non-pressure chronic ulcer of other part of left foot with fat layer exposed L97.512 Non-pressure chronic ulcer of other part of right foot with fat layer exposed L89.323  Pressure ulcer of left buttock, stage 3 L97.322 Non-pressure chronic ulcer of left ankle with fat layer exposed Quantity: 1 Electronic Signature(s) Signed: 09/26/2023 8:41:03 AM By: Duanne Guess MD FACS Entered By: Duanne Guess on 09/26/2023 05:40:56

## 2023-09-26 NOTE — Progress Notes (Signed)
[name], [name] ([id number]) [id number].pdf Page 1 of 18 Visit Report for 09/26/2023 Arrival Information Details Patient Name: Date of Service: [name], [name] 09/26/2023 7:30 [name] Medical Record Number: [id number] Patient Account Number: [id number] Date of Birth/Sex: Treating RN: 08-01-1986 (37 y.o. Damaris Schooner Primary Care Destiney Sanabia: [name], [name] Other Clinician: Referring Mickey Esguerra: Treating Josanna Hefel/Extender: Fredderick Severance [name]: 403 Visit Information History Since Last Visit Added or deleted any medications: No Patient Arrived: Wheel Chair Any new allergies or adverse reactions: No Arrival Time: 07:43 Had [name] fall or experienced change in No Accompanied By: self activities of daily living that may affect Transfer Assistance: None risk of falls: Patient Identification Verified: Yes Signs or symptoms of abuse/neglect since last visito No Secondary Verification Process Completed: Yes Hospitalized since last visit: No Patient Requires Transmission-Based Precautions: No Implantable device outside of the clinic excluding No Patient Has Alerts: Yes cellular tissue based products placed in the center Patient Alerts: R ABI = 1.0 since last visit: L ABI = 1.1 Has Dressing in Place as Prescribed: Yes Has Compression in Place as Prescribed: Yes Pain Present Now: No Electronic Signature(s) Signed: 09/26/2023 4:36:46 PM By: Zenaida Deed RN, BSN Entered By: Zenaida Deed on 09/26/2023 04:44:07 -------------------------------------------------------------------------------- Encounter Discharge Information Details Patient Name: Date of Service: [name], [name] 09/26/2023 7:30 [name] Medical Record Number: [id number] Patient Account Number: [id number] Date of Birth/Sex: Treating RN: 10/29/1986 (37 y.o. Damaris Schooner Primary Care Yandiel Bergum: [name], [name] Other Clinician: Referring Marquia Costello: Treating Anniyah Mood/Extender: Fredderick Severance [name]: [phone] Encounter Discharge Information Items Post Procedure Vitals Discharge Condition: Stable Temperature (F): 98.1 Ambulatory Status: Wheelchair Pulse (bpm): 128 Discharge Destination: Home Respiratory Rate (breaths/min): 18 Transportation: Private Auto Blood Pressure (mmHg): 132/78 Accompanied By: self Schedule Follow-up Appointment: Yes Clinical Summary of Care: Patient Declined Electronic Signature(s) Signed: 09/26/2023 4:36:46 PM By: Zenaida Deed RN, BSN Entered By: Zenaida Deed on 09/26/2023 07:16:45 [name], [name] ([id number]) [id number] Page 2 of 18 -------------------------------------------------------------------------------- Lower Extremity Assessment Details Patient Name: Date of Service: [name], [name] 09/26/2023 7:30 [name] Medical Record Number: [id number] Patient Account Number: [id number] Date of Birth/Sex: Treating RN: [birth year] (37 y.o. Damaris Schooner Primary Care Jerrianne Hartin: [name], [name] Other Clinician: Referring Archie Atilano: Treating Adalynd Donahoe/Extender: Benjamine Mola, [name]: 403 Edema Assessment Assessed: [Left: No] [Right: No] Edema: [Left: Yes] [Right: Yes] Calf Left: Right: Point of Measurement: 33 cm From Medial Instep 29 cm 33.5 cm Ankle Left: Right: Point of Measurement: 10 cm From Medial Instep 24.5 cm 24 cm Vascular Assessment Pulses: Dorsalis Pedis Palpable: [Left:Yes] [Right:Yes] Extremity colors, hair growth, and conditions: Extremity Color: [Left:Normal] [Right:Normal] Hair Growth on Extremity: [Left:Yes] [Right:Yes] Temperature of Extremity: [Left:Warm < 3 seconds] [Right:Warm < 3 seconds] Electronic Signature(s) Signed: 09/26/2023 4:36:46 PM By: Zenaida Deed RN, BSN Entered By: Zenaida Deed on 09/26/2023 04:53:52 -------------------------------------------------------------------------------- Multi Wound Chart Details Patient  Name: Date of Service: [name], [name] 09/26/2023 7:30 [name] Medical Record Number: [id number] Patient Account Number: [id number] Date of Birth/Sex: Treating RN: 1985-12-30 (37 y.o. 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 Primary Etiology: Sleep Apnea, Hypertension, Paraplegia Sleep Apnea, Hypertension, Paraplegia Sleep Apnea, Hypertension, Paraplegia Comorbid History: 03/27/2021 07/11/2021 06/22/2022 Date Acquired: 130 113 64 Weeks of Treatment: Open Open Open Wound Status: No No No Wound Recurrence: No Yes No Clustered Wound: [name] [name] [name] Clustered Quantity: 1x4.4x0.1 2x2.4x0.1 1.8x0.8x0.1 Measurements L x W x D (cm) 3.456 3.77 1.131 [name] (cm) : rea 0.346 0.377 0.113 Volume (cm) : -83.30% 39.40% 79.80% % Reduction in [name] rea: -84.00% 39.40% 79.90% % Reduction in Volume: Full Thickness Without Exposed Full Thickness Without Exposed Full Thickness Without Exposed Classification: Support Structures Support Structures Support Structures Medium Medium Medium Exudate Amount: Serous Serosanguineous Serous Exudate Type: amber red, brown amber Exudate Color: No No No Foul Odor [name] Cleansing: fter [name] [name] [name] Odor Anticipated Due to Product Use: Distinct, outline attached Flat and Intact Distinct, outline attached Wound Margin: Small (1-33%) Medium (34-66%) None Present (0%) Granulation [name] mount: Red Red  [name] Granulation Quality: Large (67-100%) Medium (34-66%) Large (67-100%) Necrotic Amount: Fat Layer (Subcutaneous Tissue): Yes Fat Layer (Subcutaneous Tissue): Yes Fat Layer (Subcutaneous Tissue): Yes Exposed Structures: Fascia: No Fascia: No Fascia: No Tendon: No Tendon: No Tendon: No Muscle: No Muscle: No Muscle: No Joint: No Joint: No Joint: No Bone: No Bone: No Bone: No Small (1-33%) Small (1-33%) Small (1-33%) Epithelialization: Debridement - Excisional Debridement - Excisional Debridement - Excisional Debridement: Pre-procedure Verification/Time Out 08:20 08:20 08:20 Taken: Lidocaine 4% Topical Solution Lidocaine 4% Topical Solution Lidocaine 4% Topical Solution Pain Control: Subcutaneous, Slough Subcutaneous, Slough Subcutaneous, Slough Tissue Debrided: Skin/Subcutaneous Tissue Skin/Subcutaneous Tissue Skin/Subcutaneous Tissue Level: 3.45 3.77 1.13 Debridement [name] (sq cm): rea Curette Curette Curette Instrument: Minimum Minimum Minimum Bleeding: Pressure Pressure Pressure Hemostasis [name] chieved: Insensate Insensate Insensate Procedural Pain: Insensate Insensate Insensate Post Procedural Pain: Procedure was tolerated well Procedure was tolerated well Procedure was tolerated well Debridement Treatment Response: 1x4.4x0.1 2x2.4x0.1 1.8x0.8x0.1 Post Debridement Measurements L x W x D (cm) 0.346 0.377 0.113 Post Debridement Volume: (cm) [name] [name] [name] Post Debridement Stage: No Abnormalities Noted No Abnormalities Noted Scarring: Yes Periwound Skin Texture: Dry/Scaly: Yes Dry/Scaly: Yes Dry/Scaly: Yes Periwound Skin Moisture: Maceration: No Maceration: No Rubor: No Rubor: Yes Erythema: No Periwound Skin Color: Erythema: No No Abnormality No Abnormality No Abnormality Temperature: Debridement Debridement Debridement Procedures Performed: Wound Number: [id number] Photos: No Photos Left, Posterior Upper Leg Right, Anterior Ankle Left, Plantar  Foot Wound Location: Pressure Injury Gradually Appeared Gradually Appeared Wounding Event: Pressure Ulcer Venous Leg Ulcer Lymphedema Primary Etiology: Sleep Apnea, Hypertension, Paraplegia Sleep Apnea, Hypertension, Paraplegia Sleep Apnea, Hypertension, Paraplegia Comorbid History: 01/10/2023 07/04/2023 07/04/2023 Date Acquired: 35 10 10 Weeks of Treatment: Open Open Healed - Epithelialized Wound Status: No No No Wound Recurrence: Yes No No Clustered Wound: Mandala, Xavyer E ([id number]) [id number] Page 4 of 18 2 [name] [name] Clustered Quantity: 12x6.2x0.1 0.1x0.1x0.1 0x0x0 Measurements L x W x D (cm) 58.434 0.008 0 [name] (cm) : rea 5.843 0.001 0 Volume (cm) : 21.70% 99.60% 100.00% % Reduction in Area: 21.70% 99.40% 100.00% % Reduction in Volume: Category/Stage III Full Thickness Without Exposed Full Thickness Without Exposed Classification: Support Structures Support Structures Large None Present None Present Exudate Amount: Purulent [name] [name] Exudate Type: yellow, brown, green [name] [name] Exudate Color: Yes No No Foul Odor [name] Cleansing: fter No [name] [name] Odor Anticipated Due to Product Use: Flat and Intact Flat and Intact [name] Wound Margin: Large (67-100%) None Present (0%) None Present (0%) Granulation [name] mount: Red, Hyper-granulation,  Friable [name] [name] Granulation Quality: None Present (0%) None Present (0%) None Present (0%) Necrotic Amount: Fat Layer (Subcutaneous Tissue): Yes Fascia: No Fascia: No Exposed Structures: Fascia: No Fat Layer (Subcutaneous Tissue): No Fat Layer (Subcutaneous Tissue): No Tendon: No Tendon: No Tendon: No Muscle: No Muscle: No Muscle: No Joint: No Joint: No Joint: No Bone: No Bone: No Bone: No Small (1-33%) Large (67-100%) Large (67-100%) Epithelialization: Debridement - Excisional [name] [name] Debridement: Pre-procedure Verification/Time Out 08:20 [name] [name] Taken: Lidocaine 4% Topical Solution [name] [name] Pain  Control: Subcutaneous, Slough [name] [name] Tissue Debrided: Skin/Subcutaneous Tissue [name] [name] Level: 2.92 [name] [name] Debridement [name] (sq cm): rea Curette [name] [name] Instrument: Minimum [name] [name] Bleeding: Pressure [name] [name] Hemostasis [name] chieved: Insensate [name] [name] Procedural Pain: Insensate [name] [name] Post Procedural Pain: Procedure was tolerated well [name] [name] Debridement Treatment Response: 12x6.2x0.1 [name] [name] Post Debridement Measurements L x W x D (cm) 5.843 [name] [name] Post Debridement Volume: (cm) Category/Stage III [name] [name] Post Debridement Stage: Scarring: Yes No Abnormalities Noted No Abnormalities Noted Periwound Skin Texture: Dry/Scaly: Yes No Abnormalities Noted Dry/Scaly: Yes Periwound Skin Moisture: Maceration: No Ecchymosis: Yes No Abnormalities Noted No Abnormalities Noted Periwound Skin Color: No Abnormality No Abnormality No Abnormality Temperature: Debridement [name] [name] Procedures Performed: Wound Number: 51 [name] [name] Photos: [name] [name] Right, Plantar Foot [name] [name] Wound Location: Gradually Appeared [name] [name] Wounding Event: Lymphedema [name] [name] Primary Etiology: Sleep Apnea, Hypertension, Paraplegia [name] [name] Comorbid History: 09/23/2023 [name] [name] Date Acquired: 0 [name] [name] Weeks of Treatment: Open [name] [name] Wound Status: No [name] [name] Wound Recurrence: No [name] [name] Clustered Wound: [name] [name] [name] Clustered Quantity: 0.5x1.4x0.1 [name] [name] Measurements L x W x D (cm) 0.55 [name] [name] [name] (cm) : rea 0.055 [name] [name] Volume (cm) : [name] [name] [name] % Reduction in [name] rea: [name] [name] [name] % Reduction in Volume: Full Thickness Without Exposed [name] [name] Classification: Support Structures Medium [name] [name] Exudate Amount: Serosanguineous [name] [name] Exudate Type: red, brown [name] [name] Exudate Color: Yes [name] [name] Foul Odor [name] Cleansing: fter No [name] [name] Odor Anticipated Due to Product Use: Flat and Intact [name] [name] Wound Margin: None Present (0%) [name] [name] Granulation [name] mount: Kilmer, Whitaker E ([id number])  [id number] Page 5 of 18 [name] [name] [name] Granulation Quality: Large (67-100%) [name] [name] Necrotic Amount: Fat Layer (Subcutaneous Tissue): Yes [name] [name] Exposed Structures: Fascia: No Tendon: No Muscle: No Joint: No Bone: No Medium (34-66%) [name] [name] Epithelialization: Debridement - Excisional [name] [name] Debridement: Pre-procedure Verification/Time Out 08:20 [name] [name] Taken: Lidocaine 4% Topical Solution [name] [name] Pain Control: Subcutaneous, Slough [name] [name] Tissue Debrided: Skin/Subcutaneous Tissue [name] [name] Level: 0.27 [name] [name] Debridement [name] (sq cm): rea Curette [name] [name] Instrument: Minimum [name] [name] Bleeding: Pressure [name] [name] Hemostasis [name] chieved: Insensate [name] [name] Procedural Pain: Insensate [name] [name] Post Procedural Pain: Procedure was tolerated well [name] [name] Debridement Treatment Response: 0.5x1.4x0.1 [name] [name] Post Debridement Measurements L x W x D (cm) 0.055 [name] [name] Post Debridement Volume: (cm) [name] [name] [name] Post Debridement Stage: No Abnormalities Noted [name] [name] Periwound Skin Texture: Maceration: Yes [name] [name] Periwound Skin Moisture: No Abnormalities Noted [name] [name] Periwound Skin Color: [name] [name] [name] Temperature: Debridement [name] [name] Procedures Performed: Treatment Notes Electronic Signature(s) Signed: 09/26/2023 8:34:55 AM By: Duanne Guess MD FACS Entered By: Duanne Guess on 09/26/2023 05:34:55 -------------------------------------------------------------------------------- Multi-Disciplinary Care Plan Details Patient Name: Date of Service: Janusz, [name] 09/26/2023 7:30 [name] Medical Record Number: [id number] Patient Account Number: [id number] Date  of Birth/Sex: Treating RN: 09-06-1986 (36 y.o. Damaris Schooner Primary Care Gerrad Welker: [name], [name] Other Clinician: Referring Camari Wisham: Treating Myles Tavella/Extender: Fredderick Severance [name]: 403 Multidisciplinary Care Plan reviewed with physician Active  Inactive Pressure Nursing Diagnoses: Knowledge deficit related to management of pressures ulcers Potential for impaired tissue integrity related to pressure, friction, moisture, and shear Goals: Patient will remain free from development of additional pressure ulcers Date Initiated: 06/21/2018 Date Inactivated: 02/12/2019 Target Resolution Date: 02/16/2019 Unmet Reason: pressure ulcer Goal Status: Unmet reopened on left ischium Patient/caregiver will verbalize risk factors for pressure ulcer development Date Initiated: 06/21/2018 Date Inactivated: 05/28/2019 Target Resolution Date: 06/01/2019 Goal Status: Met Patient/caregiver will verbalize understanding of pressure ulcer management Date Initiated: 06/01/2022 Target Resolution Date: 10/21/2023 Goal Status: Active Huneycutt, Rue E ([id number]) [id number] Page 6 of 18 Interventions: Assess: immobility, friction, shearing, incontinence upon admission and as needed Assess offloading mechanisms upon admission and as needed Assess potential for pressure ulcer upon admission and as needed Treatment Activities: Pressure reduction/relief device ordered : 12/05/2017 Notes: Venous Leg Ulcer Nursing Diagnoses: Knowledge deficit related to disease process and management Potential for venous Insuffiency (use before diagnosis confirmed) Goals: Non-invasive venous studies are completed as ordered Date Initiated: 01/05/2016 Date Inactivated: 09/15/2017 Target Resolution Date: 01/20/2017 Goal Status: Met Patient will maintain optimal edema control Date Initiated: 01/05/2016 Target Resolution Date: 10/21/2023 Goal Status: Active Patient/caregiver will verbalize understanding of disease process and disease management Date Initiated: 01/05/2016 Date Inactivated: 09/15/2017 Target Resolution Date: 01/20/2017 Goal Status: Met Verify adequate tissue perfusion prior to therapeutic compression application Date Initiated:  01/05/2016 Date Inactivated: 09/15/2017 Target Resolution Date: 01/20/2017 Goal Status: Met Interventions: Assess peripheral edema status every visit. Compression as ordered Provide education on venous insufficiency Treatment Activities: Non-invasive vascular studies : 03/11/2016 Therapeutic compression applied : 03/11/2016 Notes: Wound/Skin Impairment Nursing Diagnoses: Impaired tissue integrity Knowledge deficit related to ulceration/compromised skin integrity Goals: Patient/caregiver will verbalize understanding of skin care regimen Date Initiated: 06/01/2022 Target Resolution Date: 10/21/2023 Goal Status: Active Ulcer/skin breakdown will have [name] volume reduction of 30% by week 4 Date Initiated: 06/01/2022 Date Inactivated: 06/29/2022 Target Resolution Date: 06/29/2022 Unmet Reason: complex wounds, Goal Status: Unmet infection Interventions: Assess patient/caregiver ability to obtain necessary supplies Assess ulceration(s) every visit Provide education on ulcer and skin care Notes: 02/02/21: Complex Care, ongoing. Electronic Signature(s) Signed: 09/26/2023 4:36:46 PM By: Zenaida Deed RN, BSN Entered By: Zenaida Deed on 09/26/2023 05:25:00 Mcdaris, [name] ([id number]) [id number] Page 7 of 18 -------------------------------------------------------------------------------- Pain Assessment Details Patient Name: Date of Service: Stearns, [name] 09/26/2023 7:30 [name] Medical Record Number: [id number] Patient Account Number: [id number] Date of Birth/Sex: Treating RN: 02-Jul-1986 (37 y.o. Damaris Schooner Primary Care Willow Reczek: [name], [name] Other Clinician: Referring Makaria Poarch: Treating Payam Gribble/Extender: Fredderick Severance [name]: [phone] Active Problems Location of Pain Severity and Description of Pain Patient Has Paino No Site Locations Rate the pain. Current Pain Level: 0 Pain Management and Medication Current Pain  Management: Electronic Signature(s) Signed: 09/26/2023 4:36:46 PM By: Zenaida Deed RN, BSN Entered By: Zenaida Deed on 09/26/2023 04:44:20 -------------------------------------------------------------------------------- Patient/Caregiver Education Details Patient Name: Date of Service: [name], [name] Ronney Asters 11/4/2024andnbsp7:30 [name] Medical Record Number: [id number] Patient Account Number: [id number] Date of Birth/Gender: Treating RN: [birth year] (37 y.o. Damaris Schooner Primary Care Physician: Eunice Blase Other Clinician: Referring Physician: Treating Physician/Extender: Fredderick Severance [name]: [phone] Education Assessment Education Provided To: Patient Education Topics Provided Venous: Methods: Explain/Verbal Responses: Reinforcements needed, State  content correctly Wound/Skin Impairment: Methods: Explain/Verbal Responses: Reinforcements needed, State content correctly [place], Alison E ([id number]) [phone].pdf Page 8 of 18 Electronic Signature(s) Signed: 09/26/2023 4:36:46 PM By: Zenaida Deed RN, BSN Entered By: Zenaida Deed on 09/26/2023 05:25:57 -------------------------------------------------------------------------------- Wound Assessment Details Patient Name: Date of Service: [name], [name] 09/26/2023 7:30 [name] Medical Record Number: [id number] Patient Account Number: [id number] Date of Birth/Sex: Treating RN: 09-17-1986 (37 y.o. Damaris Schooner Primary Care Baptiste Littler: [name], [name] Other Clinician: Referring Rutledge Selsor: Treating Crystie Yanko/Extender: Benjamine Mola, [name]: 403 Wound Status Wound Number: [id number] Primary Etiology: Venous Leg Ulcer Wound Location: Right, Dorsal Foot Wound Status: Open Wounding Event: Gradually Appeared Comorbid History: Sleep Apnea, Hypertension, Paraplegia Date Acquired: 03/27/2021 Weeks Of Treatment: 130 Clustered Wound: No Photos Wound Measurements Length:  (cm) 1 Width: (cm) 4.4 Depth: (cm) 0.1 Area: (cm) 3.456 Volume: (cm) 0.346 % Reduction in Area: -83.3% % Reduction in Volume: -84% Epithelialization: Small (1-33%) Tunneling: No Undermining: No Wound Description Classification: Full Thickness Without Exposed Suppor Wound Margin: Distinct, outline attached Exudate Amount: Medium Exudate Type: Serous Exudate Color: amber t Structures Foul Odor After Cleansing: No Slough/Fibrino Yes Wound Bed Granulation Amount: Small (1-33%) Exposed Structure Granulation Quality: Red Fascia Exposed: No Necrotic Amount: Large (67-100%) Fat Layer (Subcutaneous Tissue) Exposed: Yes Necrotic Quality: Adherent Slough Tendon Exposed: No Muscle Exposed: No Joint Exposed: No Bone Exposed: No Periwound Skin Texture Texture Color No Abnormalities Noted: Yes No Abnormalities Noted: No Rubor: No Moisture No Abnormalities Noted: No Temperature / Pain Dry / Scaly: Yes Temperature: No Abnormality Maceration: No Parsell, Zac E ([id number]) [id number] Page 9 of 18 Treatment Notes Wound #52 (Foot) Wound Laterality: Dorsal, Right Cleanser Soap and Water Discharge Instruction: May shower and wash wound with dial antibacterial soap and water prior to dressing change. Wound Cleanser Discharge Instruction: Cleanse the wound with wound cleanser prior to applying [name] clean dressing using gauze sponges, not tissue or cotton balls. Peri-Wound Care Sween Lotion (Moisturizing lotion) Discharge Instruction: Apply Aquaphor moisturizing lotion as directed Topical Mupirocin Ointment Discharge Instruction: Apply Mupirocin (Bactroban) as instructed Primary Dressing Maxorb Extra Ag+ Alginate Dressing, 2x2 (in/in) Discharge Instruction: Apply to wound bed as instructed Secondary Dressing Woven Gauze Sponge, Non-Sterile 4x4 in Discharge Instruction: Apply over primary dressing as directed. Secured With [employer], 4.5x3.1  (in/yd) Discharge Instruction: Secure with Kerlix as directed. 43M Medipore H Soft Cloth Surgical T ape, 4 x 10 (in/yd) Discharge Instruction: Secure with tape as directed. Compression Wrap Compression Stockings Add-Ons Electronic Signature(s) Signed: 09/26/2023 4:36:46 PM By: Zenaida Deed RN, BSN Entered By: Zenaida Deed on 09/26/2023 05:09:16 -------------------------------------------------------------------------------- Wound Assessment Details Patient Name: Date of Service: [name], [name] 09/26/2023 7:30 [name] Medical Record Number: [id number] Patient Account Number: [id number] Date of Birth/Sex: Treating RN: January 28, 1986 (37 y.o. Damaris Schooner Primary Care Jaice Lague: [name], [name] Other Clinician: Referring Aspen Deterding: Treating Madysun Thall/Extender: Benjamine Mola, [name]: 403 Wound Status Wound Number: 56 Primary Etiology: Neuropathic Ulcer-Non Diabetic Wound Location: Left, Dorsal Foot Wound Status: Open Wounding Event: Gradually Appeared Comorbid History: Sleep Apnea, Hypertension, Paraplegia Date Acquired: 07/11/2021 Weeks Of Treatment: 113 Clustered Wound: Yes Photos [place], Qais E ([id number]) [id number] Page 10 of 18 Wound Measurements Length: (cm) 2 Width: (cm) 2.4 Depth: (cm) 0.1 Area: (cm) 3.77 Volume: (cm) 0.377 % Reduction in Area: 39.4% % Reduction in Volume: 39.4% Epithelialization: Small (1-33%) Tunneling: No Undermining: No Wound Description Classification: Full Thickness Without Exposed Support Structures Wound Margin: Flat and Intact Exudate  Amount: Medium Exudate Type: Serosanguineous Exudate Color: red, brown Foul Odor After Cleansing: No Slough/Fibrino Yes Wound Bed Granulation Amount: Medium (34-66%) Exposed Structure Granulation Quality: Red Fascia Exposed: No Necrotic Amount: Medium (34-66%) Fat Layer (Subcutaneous Tissue) Exposed: Yes Necrotic Quality: Adherent Slough Tendon  Exposed: No Muscle Exposed: No Joint Exposed: No Bone Exposed: No Periwound Skin Texture Texture Color No Abnormalities Noted: Yes No Abnormalities Noted: No Erythema: No Moisture Rubor: Yes No Abnormalities Noted: No Dry / Scaly: Yes Temperature / Pain Maceration: No Temperature: No Abnormality Treatment Notes Wound #56 (Foot) Wound Laterality: Dorsal, Left Cleanser Soap and Water Discharge Instruction: May shower and wash wound with dial antibacterial soap and water prior to dressing change. Wound Cleanser Discharge Instruction: Cleanse the wound with wound cleanser prior to applying [name] clean dressing using gauze sponges, not tissue or cotton balls. Peri-Wound Care Sween Lotion (Moisturizing lotion) Discharge Instruction: Apply Aquaphor moisturizing lotion as directed Topical Mupirocin Ointment Discharge Instruction: Apply Mupirocin (Bactroban) as instructed Primary Dressing Maxorb Extra Ag+ Alginate Dressing, 2x2 (in/in) Discharge Instruction: Apply to wound bed as instructed Secondary Dressing Woven Gauze Sponge, Non-Sterile 4x4 in Discharge Instruction: Apply over primary dressing as directed. Secured With [employer], 4.5x3.1 (in/yd) Discharge Instruction: Secure with Kerlix as directed. Mcgraw, Brooks E ([id number]) [id number].pdf Page 11 of 18 55M Medipore H Soft Cloth Surgical T ape, 4 x 10 (in/yd) Discharge Instruction: Secure with tape as directed. Compression Wrap Compression Stockings Add-Ons Electronic Signature(s) Signed: 09/26/2023 4:36:46 PM By: Zenaida Deed RN, BSN Entered By: Zenaida Deed on 09/26/2023 05:10:02 -------------------------------------------------------------------------------- Wound Assessment Details Patient Name: Date of Service: [name], [name] 09/26/2023 7:30 [name] Medical Record Number: [id number] Patient Account Number: [id number] Date of Birth/Sex: Treating RN: August 04, 1986 (37 y.o. Damaris Schooner Primary Care Diontae Route: [name], [name] Other Clinician: Referring Kennita Pavlovich: Treating Yudith Norlander/Extender: Benjamine Mola, [name]: 403 Wound Status Wound Number: 67 Primary Etiology: Venous Leg Ulcer Wound Location: Left, Lateral Lower Leg Wound Status: Open Wounding Event: Gradually Appeared Comorbid History: Sleep Apnea, Hypertension, Paraplegia Date Acquired: 06/22/2022 Weeks Of Treatment: 64 Clustered Wound: No Photos Wound Measurements Length: (cm) 1.8 Width: (cm) 0.8 Depth: (cm) 0.1 Area: (cm) 1.131 Volume: (cm) 0.113 % Reduction in Area: 79.8% % Reduction in Volume: 79.9% Epithelialization: Small (1-33%) Tunneling: No Undermining: No Wound Description Classification: Full Thickness Without Exposed Suppor Wound Margin: Distinct, outline attached Exudate Amount: Medium Exudate Type: Serous Exudate Color: amber t Structures Foul Odor After Cleansing: No Slough/Fibrino No Wound Bed Granulation Amount: None Present (0%) Exposed Structure Necrotic Amount: Large (67-100%) Fascia Exposed: No Necrotic Quality: Adherent Slough Fat Layer (Subcutaneous Tissue) Exposed: Yes Tendon Exposed: No Muscle Exposed: No Joint Exposed: No Bone Exposed: No Fornwalt, Derian E ([id number]) [id number].pdf Page 12 of 18 Periwound Skin Texture Texture Color No Abnormalities Noted: No No Abnormalities Noted: Yes Scarring: Yes Temperature / Pain Temperature: No Abnormality Moisture No Abnormalities Noted: No Dry / Scaly: Yes Treatment Notes Wound #67 (Lower Leg) Wound Laterality: Left, Lateral Cleanser Soap and Water Discharge Instruction: May shower and wash wound with dial antibacterial soap and water prior to dressing change. Wound Cleanser Discharge Instruction: Cleanse the wound with wound cleanser prior to applying [name] clean dressing using gauze sponges, not tissue or cotton balls. Peri-Wound Care Sween Lotion (Moisturizing  lotion) Discharge Instruction: Apply Aquaphor moisturizing lotion as directed Topical Mupirocin Ointment Discharge Instruction: Apply Mupirocin (Bactroban) as instructed Primary Dressing Maxorb Extra Ag+ Alginate Dressing, 2x2 (in/in) Discharge Instruction: Apply to wound bed  as instructed Secondary Dressing Woven Gauze Sponge, Non-Sterile 4x4 in Discharge Instruction: Apply over primary dressing as directed. Secured With [employer], 4.5x3.1 (in/yd) Discharge Instruction: Secure with Kerlix as directed. 50M Medipore H Soft Cloth Surgical T ape, 4 x 10 (in/yd) Discharge Instruction: Secure with tape as directed. Compression Wrap Compression Stockings Add-Ons Electronic Signature(s) Signed: 09/26/2023 4:36:46 PM By: Zenaida Deed RN, BSN Entered By: Zenaida Deed on 09/26/2023 05:10:48 -------------------------------------------------------------------------------- Wound Assessment Details Patient Name: Date of Service: [name], [name] 09/26/2023 7:30 [name] Medical Record Number: [id number] Patient Account Number: [id number] Date of Birth/Sex: Treating RN: 1985/12/29 (37 y.o. Damaris Schooner Primary Care Brinnley Lacap: [name], [name] Other Clinician: Referring Ari Bernabei: Treating Isatu Macinnes/Extender: Benjamine Mola, [name]: 403 Wound Status Wound Number: 74 Primary Etiology: Pressure Ulcer Wound Location: Left, Posterior Upper Leg Wound Status: Open Wounding Event: Pressure Injury Comorbid History: Sleep Apnea, Hypertension, Paraplegia Date Acquired: 01/10/2023 Weeks Of Treatment: 35 Lodato, Budd E ([id number]) [id number] Page 13 of 18 Clustered Wound: Yes Wound Measurements Length: (cm) Width: (cm) Depth: (cm) Clustered Quantity: Area: (cm) Volume: (cm) 12 % Reduction in Area: 21.7% 6.2 % Reduction in Volume: 21.7% 0.1 Epithelialization: Small (1-33%) 2 Tunneling: No 58.434 Undermining: No 5.843 Wound  Description Classification: Category/Stage III Wound Margin: Flat and Intact Exudate Amount: Large Exudate Type: Purulent Exudate Color: yellow, brown, green Foul Odor After Cleansing: Yes Due to Product Use: No Slough/Fibrino No Wound Bed Granulation Amount: Large (67-100%) Exposed Structure Granulation Quality: Red, Hyper-granulation, Friable Fascia Exposed: No Necrotic Amount: None Present (0%) Fat Layer (Subcutaneous Tissue) Exposed: Yes Tendon Exposed: No Muscle Exposed: No Joint Exposed: No Bone Exposed: No Periwound Skin Texture Texture Color No Abnormalities Noted: No No Abnormalities Noted: Yes Scarring: Yes Temperature / Pain Temperature: No Abnormality Moisture No Abnormalities Noted: No Dry / Scaly: Yes Treatment Notes Wound #74 (Upper Leg) Wound Laterality: Left, Posterior Cleanser Vashe 5.8 (oz) Discharge Instruction: Cleanse the wound with Vashe prior to applying [name] clean dressing using gauze sponges , let sit on wound for 10 minutes Peri-Wound Care Zinc Oxide Ointment 30g tube Discharge Instruction: Apply Zinc Oxide to periwound with each dressing change as needed fpr moisture Topical Gentamicin Discharge Instruction: in clinic if [facility] not available [facility] antibiotic compound Primary Dressing Maxorb Extra Ag+ Alginate Dressing, 4x4.75 (in/in) Discharge Instruction: Apply to wound bed as instructed Secondary Dressing Zetuvit Plus Silicone Border Sacrum Dressing, Sm, 7x7 (in/in) Discharge Instruction: Apply silicone border over primary dressing as directed. Secured With 50M Medipore H Soft Cloth Surgical T ape, 4 x 10 (in/yd) Discharge Instruction: Secure with tape as directed. Compression Wrap Compression Stockings Add-Ons Electronic Signature(s) Signed: 09/26/2023 4:36:46 PM By: Zenaida Deed RN, BSN Entered By: Zenaida Deed on 09/26/2023 05:19:33 Oregel, [name] ([id number]) [id number].pdf Page 14 of  18 -------------------------------------------------------------------------------- Wound Assessment Details Patient Name: Date of Service: [name], [name] 09/26/2023 7:30 [name] Medical Record Number: [id number] Patient Account Number: [id number] Date of Birth/Sex: Treating RN: 1986-09-04 (37 y.o. Damaris Schooner Primary Care Judee Hennick: [name], [name] Other Clinician: Referring Garland Hincapie: Treating Alayasia Breeding/Extender: Benjamine Mola, [name]: 403 Wound Status Wound Number: 78 Primary Etiology: Venous Leg Ulcer Wound Location: Right, Anterior Ankle Wound Status: Open Wounding Event: Gradually Appeared Comorbid History: Sleep Apnea, Hypertension, Paraplegia Date Acquired: 07/04/2023 Weeks Of Treatment: 10 Clustered Wound: No Photos Wound Measurements Length: (cm) 0.1 Width: (cm) 0.1 Depth: (cm) 0.1 Area: (cm) 0.008 Volume: (cm) 0.001 % Reduction in Area: 99.6% % Reduction in  Volume: 99.4% Epithelialization: Large (67-100%) Tunneling: No Undermining: No Wound Description Classification: Full Thickness Without Exposed Support Structures Wound Margin: Flat and Intact Exudate Amount: None Present Foul Odor After Cleansing: No Slough/Fibrino No Wound Bed Granulation Amount: None Present (0%) Exposed Structure Necrotic Amount: None Present (0%) Fascia Exposed: No Fat Layer (Subcutaneous Tissue) Exposed: No Tendon Exposed: No Muscle Exposed: No Joint Exposed: No Bone Exposed: No Periwound Skin Texture Texture Color No Abnormalities Noted: Yes No Abnormalities Noted: Yes Moisture Temperature / Pain No Abnormalities Noted: Yes Temperature: No Abnormality Treatment Notes Wound #78 (Ankle) Wound Laterality: Right, Anterior Cleanser Soap and Water Discharge Instruction: May shower and wash wound with dial antibacterial soap and water prior to dressing change. Joshua, Ethanjames E ([id number]) [phone].pdf Page 15 of 18 Wound  Cleanser Discharge Instruction: Cleanse the wound with wound cleanser prior to applying [name] clean dressing using gauze sponges, not tissue or cotton balls. Peri-Wound Care Sween Lotion (Moisturizing lotion) Discharge Instruction: Apply Aquaphor moisturizing lotion as directed Topical Mupirocin Ointment Discharge Instruction: Apply Mupirocin (Bactroban) as instructed Primary Dressing Maxorb Extra Ag+ Alginate Dressing, 2x2 (in/in) Discharge Instruction: Apply to wound bed as instructed Secondary Dressing Woven Gauze Sponge, Non-Sterile 4x4 in Discharge Instruction: Apply over primary dressing as directed. Secured With [employer], 4.5x3.1 (in/yd) Discharge Instruction: Secure with Kerlix as directed. 44M Medipore H Soft Cloth Surgical T ape, 4 x 10 (in/yd) Discharge Instruction: Secure with tape as directed. Compression Wrap Compression Stockings Add-Ons Electronic Signature(s) Signed: 09/26/2023 4:36:46 PM By: Zenaida Deed RN, BSN Entered By: Zenaida Deed on 09/26/2023 05:11:29 -------------------------------------------------------------------------------- Wound Assessment Details Patient Name: Date of Service: [name], [name] 09/26/2023 7:30 [name] Medical Record Number: [id number] Patient Account Number: [id number] Date of Birth/Sex: Treating RN: 1986/08/28 (37 y.o. Damaris Schooner Primary Care Mattisyn Cardona: [name], [name] Other Clinician: Referring Brookelynne Dimperio: Treating Elimelech Houseman/Extender: Benjamine Mola, [name]: 403 Wound Status Wound Number: 79 Primary Etiology: Lymphedema Wound Location: Left, Plantar Foot Wound Status: Healed - Epithelialized Wounding Event: Gradually Appeared Comorbid History: Sleep Apnea, Hypertension, Paraplegia Date Acquired: 07/04/2023 Weeks Of Treatment: 10 Clustered Wound: No Photos Wound Measurements Axtman, Mohamedamin E ([id number]) Length: (cm) Width: (cm) Depth: (cm) Area: (cm) Volume:  (cm) [id number] Page 16 of 18 0 % Reduction in Area: 100% 0 % Reduction in Volume: 100% 0 Epithelialization: Large (67-100%) 0 Tunneling: No 0 Undermining: No Wound Description Classification: Full Thickness Without Exposed Suppor Exudate Amount: None Present t Structures Foul Odor After Cleansing: No Slough/Fibrino No Wound Bed Granulation Amount: None Present (0%) Exposed Structure Necrotic Amount: None Present (0%) Fascia Exposed: No Fat Layer (Subcutaneous Tissue) Exposed: No Tendon Exposed: No Muscle Exposed: No Joint Exposed: No Bone Exposed: No Periwound Skin Texture Texture Color No Abnormalities Noted: Yes No Abnormalities Noted: Yes Moisture Temperature / Pain No Abnormalities Noted: No Temperature: No Abnormality Dry / Scaly: Yes Maceration: No Electronic Signature(s) Signed: 09/26/2023 4:36:46 PM By: Zenaida Deed RN, BSN Entered By: Zenaida Deed on 09/26/2023 05:12:24 -------------------------------------------------------------------------------- Wound Assessment Details Patient Name: Date of Service: Wichmann, [name] 09/26/2023 7:30 [name] Medical Record Number: [id number] Patient Account Number: [id number] Date of Birth/Sex: Treating RN: [birth year] (37 y.o. Damaris Schooner Primary Care Latisa Belay: [name], [name] Other Clinician: Referring Madalen Gavin: Treating Triva Hueber/Extender: Benjamine Mola, [name]: 403 Wound Status Wound Number: 81 Primary Etiology: Lymphedema Wound Location: Right, Plantar Foot Wound Status: Open Wounding Event: Gradually Appeared Comorbid History: Sleep Apnea, Hypertension, Paraplegia Date Acquired: 09/23/2023 Weeks Of Treatment: 0  Clustered Wound: No Photos Wound Measurements Rising, Amadeo E ([id number]) Length: (cm) 0.5 Width: (cm) 1.4 Depth: (cm) 0.1 Area: (cm) 0.55 Volume: (cm) 0.055 [id number] Page 17 of 18 % Reduction in Area: % Reduction  in Volume: Epithelialization: Medium (34-66%) Tunneling: No Undermining: No Wound Description Classification: Full Thickness Without Exposed Suppor Wound Margin: Flat and Intact Exudate Amount: Medium Exudate Type: Serosanguineous Exudate Color: red, brown t Structures Foul Odor After Cleansing: Yes Due to Product Use: No Slough/Fibrino Yes Wound Bed Granulation Amount: None Present (0%) Exposed Structure Necrotic Amount: Large (67-100%) Fascia Exposed: No Necrotic Quality: Adherent Slough Fat Layer (Subcutaneous Tissue) Exposed: Yes Tendon Exposed: No Muscle Exposed: No Joint Exposed: No Bone Exposed: No Periwound Skin Texture Texture Color No Abnormalities Noted: Yes No Abnormalities Noted: Yes Moisture No Abnormalities Noted: No Maceration: Yes Treatment Notes Wound #81 (Foot) Wound Laterality: Plantar, Right Cleanser Peri-Wound Care Topical Primary Dressing Secondary Dressing Secured With Compression Wrap Compression Stockings Add-Ons Electronic Signature(s) Signed: 09/26/2023 4:36:46 PM By: Zenaida Deed RN, BSN Entered By: Zenaida Deed on 09/26/2023 05:14:33 -------------------------------------------------------------------------------- Vitals Details Patient Name: Date of Service: Salay, [name] 09/26/2023 7:30 [name] Medical Record Number: [id number] Patient Account Number: [id number] Date of Birth/Sex: Treating RN: 1986/08/24 (37 y.o. Damaris Schooner Primary Care Tempestt Silba: [name], [name] Other Clinician: Referring Bekim Werntz: Treating Nari Vannatter/Extender: Benjamine Mola, [name]: 403 Vital Signs Time Taken: 07:46 Temperature (F): 98.1 Height (in): 70 Pulse (bpm): 128 Weight (lbs): 216 Respiratory Rate (breaths/min): 18 Rohrig, Atharva E ([id number]) [id number] Page 18 of 18 Body Mass Index (BMI): 31 Blood Pressure (mmHg): 132/78 Reference Range: 80 - 120 mg / dl Electronic Signature(s) Signed:  09/26/2023 4:36:46 PM By: Zenaida Deed RN, BSN Entered By: Zenaida Deed on 09/26/2023 04:46:46

## 2023-10-25 ENCOUNTER — Encounter (HOSPITAL_BASED_OUTPATIENT_CLINIC_OR_DEPARTMENT_OTHER): Payer: BC Managed Care – PPO | Attending: General Surgery | Admitting: Internal Medicine

## 2023-10-25 DIAGNOSIS — I872 Venous insufficiency (chronic) (peripheral): Secondary | ICD-10-CM | POA: Diagnosis not present

## 2023-10-25 DIAGNOSIS — Z792 Long term (current) use of antibiotics: Secondary | ICD-10-CM | POA: Insufficient documentation

## 2023-10-25 DIAGNOSIS — L97522 Non-pressure chronic ulcer of other part of left foot with fat layer exposed: Secondary | ICD-10-CM | POA: Insufficient documentation

## 2023-10-25 DIAGNOSIS — L97322 Non-pressure chronic ulcer of left ankle with fat layer exposed: Secondary | ICD-10-CM | POA: Diagnosis not present

## 2023-10-25 DIAGNOSIS — L97312 Non-pressure chronic ulcer of right ankle with fat layer exposed: Secondary | ICD-10-CM | POA: Diagnosis not present

## 2023-10-25 DIAGNOSIS — L89323 Pressure ulcer of left buttock, stage 3: Secondary | ICD-10-CM | POA: Diagnosis not present

## 2023-10-25 DIAGNOSIS — L97422 Non-pressure chronic ulcer of left heel and midfoot with fat layer exposed: Secondary | ICD-10-CM | POA: Insufficient documentation

## 2023-10-25 DIAGNOSIS — L97822 Non-pressure chronic ulcer of other part of left lower leg with fat layer exposed: Secondary | ICD-10-CM | POA: Insufficient documentation

## 2023-10-25 DIAGNOSIS — L97512 Non-pressure chronic ulcer of other part of right foot with fat layer exposed: Secondary | ICD-10-CM | POA: Diagnosis not present

## 2023-10-25 DIAGNOSIS — L89893 Pressure ulcer of other site, stage 3: Secondary | ICD-10-CM | POA: Diagnosis not present

## 2023-10-25 DIAGNOSIS — L03115 Cellulitis of right lower limb: Secondary | ICD-10-CM | POA: Diagnosis not present

## 2023-10-25 DIAGNOSIS — L299 Pruritus, unspecified: Secondary | ICD-10-CM | POA: Insufficient documentation

## 2023-10-25 DIAGNOSIS — G8221 Paraplegia, complete: Secondary | ICD-10-CM | POA: Diagnosis not present

## 2023-10-26 NOTE — Progress Notes (Addendum)
Mages, IZEAH BREUNINGER ([id number]) [id number] Page 1 of 11 Visit Report for 10/25/2023 Arrival Information Details Patient Name: Date of Service: [name] 10/25/2023 8:15 [name] Medical Record Number: [id number] Patient Account Number: [id number] Date of Birth/Sex: Treating RN: 01-May-1986 (37 y.o. Damaris Schooner Primary Care Valta Dillon: [name], [name] Other Clinician: Referring Ryker Sudbury: Treating Griffin Gerrard/Extender: Marcelline Mates [name]: 407 Visit Information History Since Last Visit Added or deleted any medications: No Patient Arrived: Wheel Chair Any new allergies or adverse reactions: No Arrival Time: 08:31 Had [name] fall or experienced change in No Accompanied By: self activities of daily living that may affect Transfer Assistance: None risk of falls: Patient Identification Verified: Yes Signs or symptoms of abuse/neglect since last visito No Secondary Verification Process Completed: Yes Hospitalized since last visit: No Patient Requires Transmission-Based Precautions: No Implantable device outside of the clinic excluding No Patient Has Alerts: Yes cellular tissue based products placed in the center Patient Alerts: R ABI = 1.0 since last visit: L ABI = 1.1 Has Dressing in Place as Prescribed: Yes Has Compression in Place as Prescribed: Yes Pain Present Now: No Electronic Signature(s) Signed: 10/26/2023 11:53:29 AM By: Zenaida Deed RN, BSN Entered By: Zenaida Deed on 10/25/2023 08:32:16 -------------------------------------------------------------------------------- Lower Extremity Assessment Details Patient Name: Date of Service: [name], [name] [name] 10/25/2023 8:15 [name] Medical Record Number: [id number] Patient Account Number: [id number] Date of Birth/Sex: Treating RN: [birth year] (37 y.o. Damaris Schooner Primary Care Aayansh Codispoti: [name], [name] Other Clinician: Referring [name]: Treating [name]/Extender: Marcelline Mates [name]: 407 Edema Assessment Assessed: [Left: No] [Right: No] Edema: [Left: Yes] [Right: Yes] Calf Left: Right: Point of Measurement: 33 cm From Medial Instep 30 cm 32 cm Ankle Left: Right: Point of Measurement: 10 cm From Medial Instep 26.2 cm 24.5 cm Vascular Assessment Pulses: Dorsalis Pedis Palpable: [Left:Yes] [Right:Yes] [name], [name] ([id number]) [Right:[id number] Page 2 of 11] Extremity colors, hair growth, and conditions: Extremity Color: [Left:Normal] [Right:Normal] Hair Growth on Extremity: [Left:Yes] [Right:Yes] Temperature of Extremity: [Left:Warm < 3 seconds] [Right:Warm < 3 seconds] Electronic Signature(s) Signed: 10/26/2023 11:53:29 AM By: Zenaida Deed RN, BSN Entered By: Zenaida Deed on 10/25/2023 08:38:14 -------------------------------------------------------------------------------- Multi Wound Chart Details Patient Name: Date of Service: [name], [name] [name] 10/25/2023 8:15 [name] Medical Record Number: [id number] Patient Account Number: [id number] Date of Birth/Sex: Treating RN: 1986/09/20 (37 y.o. M) Primary Care Rosalva Neary: [name], [name] Other Clinician: Referring Santana Edell: Treating Luretta Everly/Extender: Anabel Halon, [name]: 407 Vital Signs Height(in): 70 Pulse(bpm): 130 Weight(lbs): 216 Blood Pressure(mmHg): 145/83 Body Mass Index(BMI): 31 Temperature(F): 98.3 Respiratory Rate(breaths/min): 18 [52:Photos: No Photos Right, Dorsal Foot Wound Location: Gradually Appeared Wounding Event: Venous Leg Ulcer Primary Etiology: Sleep Apnea, Hypertension, Paraplegia Sleep Apnea, Hypertension, Paraplegia Sleep Apnea, Hypertension, Paraplegia Comorbid History:  03/27/2021 Date Acquired: 134 Weeks of Treatment: Open Wound Status: No Wound Recurrence: No Clustered Wound: [name] Clustered Quantity: 1.4x5.4x0.1 Measurements L x W x D (cm) 5.938 [name] (cm) : rea 0.594 Volume (cm) : -215.00% %  Reduction in [name] rea: -216.00% %  Reduction in Volume: Full Thickness Without Exposed Classification: Support Structures Medium Exudate [name] mount: Serous Exudate Type: amber Exudate Color: No Foul Odor [name] Cleansing: fter [name] Odor Anticipated Due to Product Use: Distinct, outline attached  Wound Margin: Small (1-33%) Granulation [name] mount: Pink Granulation Quality: Large (67-100%) Necrotic Amount: Adherent Slough Necrotic Tissue: Fat Layer (Subcutaneous Tissue): Yes Fat Layer (Subcutaneous Tissue): Yes Fat Layer (Subcutaneous Tissue): Yes  Exposed Structures:  Fascia: No Tendon: No Muscle: No Joint: No Bone: No Small (1-33%) Epithelialization: [name] Debridement: Pre-procedure Verification/Time Out [name] Taken: [name] Pain Control: [name] Tissue Debrided: [name] Level: [name] Debridement [name] (sq cm): rea]  [56:No Photos Left, Dorsal Foot Gradually Appeared Neuropathic Ulcer-Non Diabetic 07/11/2021 118 Open No Yes [name] 1.5x2.5x0.1 2.945 0.295 52.70% 52.60% Full Thickness Without Exposed Support Structures Medium Serous amber No [name] Flat and Intact Small  (1-33%) Pink Large (67-100%) Adherent Slough Fascia: No Tendon: No Muscle: No Joint: No Bone: No Small (1-33%) [name] [name] [name] [name] [name] [name]] [67:No Photos Left, Lateral Lower Leg Gradually Appeared Venous Leg Ulcer 06/22/2022 69 Open No No [name] 2x0.9x0.1 1.414  0.141 74.80% 74.90% Full Thickness Without Exposed Support Structures Medium Serous amber No [name] Distinct, outline attached Small (1-33%) Red Large (67-100%) Adherent Slough Fascia: No Tendon: No Muscle: No Joint: No Bone: No Small (1-33%) Debridement -  Excisional 09:00 Lidocaine 4% Topical Solution Subcutaneous, Slough Skin/Subcutaneous Tissue 1.41] [name], [name] ([id number]) [52:[name] Instrument: [name] Bleeding: [name] Hemostasis Achieved: [name] Procedural Pain: [name] Post Procedural Pain: [name] Debridement Treatment Response: [name] Post Debridement Measurements L x W x D (cm) [name] Post Debridement Volume: (cm) [name] Post Debridement Stage:  No  Abnormalities Noted Periwound Skin Texture: Maceration: Yes Periwound Skin Moisture: Dry/Scaly: No Rubor: No Periwound Skin Color: No Abnormality Temperature: [name] Procedures Performed:] [56:[name] [name] [name] [name] [name] [name] [name] [name] [name] Excoriation: Yes  Maceration: Yes Dry/Scaly: No Rubor: Yes Erythema: No No Abnormality [name]] [67:[id number] Page 3 of 11 Curette Minimum Silver Nitrate Insensate Insensate Procedure was tolerated well 2x0.9x0.1 0.141 [name] Scarring: Yes Dry/Scaly:  Yes No Abnormality Debridement] Wound Number: [id number] Photos: No Photos No Photos No Photos Left, Posterior Upper Leg Right, Anterior Ankle Right, Plantar Foot Wound Location: Pressure Injury Gradually Appeared Gradually Appeared Wounding Event: Pressure Ulcer Venous Leg Ulcer Lymphedema Primary Etiology: Sleep Apnea, Hypertension, Paraplegia Sleep Apnea, Hypertension, Paraplegia Sleep Apnea, Hypertension, Paraplegia Comorbid History: 01/10/2023 07/04/2023 09/23/2023 Date Acquired: 40 15 4 Weeks of Treatment: Open Open Open Wound Status: No No No Wound Recurrence: Yes No No Clustered Wound: 2 [name] [name] Clustered Quantity: 11.3x6.6x0.1 0.4x0.4x0.1 2x4x0.1 Measurements L x W x D (cm) 58.575 0.126 6.283 [name] (cm) : rea 5.857 0.013 0.628 Volume (cm) : 21.50% 93.00% -1042.40% % Reduction in [name] rea: 21.50% 92.80% -1041.80% % Reduction in Volume: Category/Stage III Full Thickness Without Exposed Full Thickness Without Exposed Classification: Support Structures Support Structures Large Small Medium Exudate [name] mount: Purulent Serosanguineous Serous Exudate Type: yellow, brown, green red, brown amber Exudate Color: Yes No Yes Foul Odor [name] Cleansing: fter No [name] No Odor Anticipated Due to Product Use: Flat and Intact Flat and Intact Flat and Intact Wound Margin: Large (67-100%) Large (67-100%) Large (67-100%) Granulation [name] mount: Red, Hyper-granulation, Friable Red Pink Granulation  Quality: Small (1-33%) Small (1-33%) Small (1-33%) Necrotic Amount: Adherent Slough Eschar Adherent Slough Necrotic Tissue: Fat Layer (Subcutaneous Tissue): Yes Fat Layer (Subcutaneous Tissue): Yes Fat Layer (Subcutaneous Tissue): Yes Exposed Structures: Fascia: No Fascia: No Fascia: No Tendon: No Tendon: No Tendon: No Muscle: No Muscle: No Muscle: No Joint: No Joint: No Joint: No Bone: No Bone: No Bone: No Small (1-33%) Medium (34-66%) Medium (34-66%) Epithelialization: Debridement - Excisional [name] [name] Debridement: Pre-procedure Verification/Time Out 09:00 [name] [name] Taken: Lidocaine 4% T opical Solution [name] [name] Pain Control: Subcutaneous, Slough [name] [name] Tissue Debrided: Skin/Subcutaneous Tissue [name] [name] Level: 29.27 [name] [name] Debridement [name] (sq cm): rea Curette [name] [name] Instrument: Minimum [name]  [name] Bleeding: Silver Nitrate [name] [name] Hemostasis [name] chieved: Insensate [name] [name] Procedural Pain: Insensate [name] [name] Post Procedural Pain: Procedure was tolerated well [name] [name] Debridement Treatment Response: 11.3x6.6x0.1 [name] [name] Post Debridement Measurements L x W x D (cm) 5.857 [name] [name] Post Debridement Volume: (cm) Category/Stage III [name] [name] Post Debridement Stage: Scarring: Yes Scarring: Yes No Abnormalities Noted Periwound Skin Texture: Dry/Scaly: Yes No Abnormalities Noted Maceration: Yes Periwound Skin Moisture: Ecchymosis: Yes No Abnormalities Noted No Abnormalities Noted Periwound Skin Color: No Abnormality No Abnormality [name] Temperature: Debridement [name] [name] Procedures Performed: Treatment Notes Electronic Signature(s) Signed: 10/30/2023 9:47:40 AM By: Baltazar Najjar MD Tosh, [name] ([id number]) [id number] Page 4 of 11 Entered By: Baltazar Najjar on 10/27/2023 07:30:39 -------------------------------------------------------------------------------- Multi-Disciplinary Care Plan Details Patient Name: Date of Service: [name], [name] [name]  E. 10/25/2023 8:15 [name] Medical Record Number: [id number] Patient Account Number: [id number] Date of Birth/Sex: Treating RN: Apr 20, 1986 (37 y.o. Damaris Schooner Primary Care Tamer Baughman: [name], [name] Other Clinician: Referring Brihana Quickel: Treating Mckinley Olheiser/Extender: Marcelline Mates [name]: [phone] Multidisciplinary Care Plan reviewed with physician Active Inactive Pressure Nursing Diagnoses: Knowledge deficit related to management of pressures ulcers Potential for impaired tissue integrity related to pressure, friction, moisture, and shear Goals: Patient will remain free from development of additional pressure ulcers Date Initiated: 06/21/2018 Date Inactivated: 02/12/2019 Target Resolution Date: 02/16/2019 Unmet Reason: pressure ulcer Goal Status: Unmet reopened on left ischium Patient/caregiver will verbalize risk factors for pressure ulcer development Date Initiated: 06/21/2018 Date Inactivated: 05/28/2019 Target Resolution Date: 06/01/2019 Goal Status: Met Patient/caregiver will verbalize understanding of pressure ulcer management Date Initiated: 06/01/2022 Target Resolution Date: 11/18/2023 Goal Status: Active Interventions: Assess: immobility, friction, shearing, incontinence upon admission and as needed Assess offloading mechanisms upon admission and as needed Assess potential for pressure ulcer upon admission and as needed Treatment Activities: Pressure reduction/relief device ordered : 12/05/2017 Notes: Venous Leg Ulcer Nursing Diagnoses: Knowledge deficit related to disease process and management Potential for venous Insuffiency (use before diagnosis confirmed) Goals: Non-invasive venous studies are completed as ordered Date Initiated: 01/05/2016 Date Inactivated: 09/15/2017 Target Resolution Date: 01/20/2017 Goal Status: Met Patient will maintain optimal edema control Date Initiated: 01/05/2016 Target Resolution Date: 11/18/2023 Goal Status:  Active Patient/caregiver will verbalize understanding of disease process and disease management Date Initiated: 01/05/2016 Date Inactivated: 09/15/2017 Target Resolution Date: 01/20/2017 Goal Status: Met Verify adequate tissue perfusion prior to therapeutic compression application Date Initiated: 01/05/2016 Date Inactivated: 09/15/2017 Target Resolution Date: 01/20/2017 Goal Status: Met Interventions: Assess peripheral edema status every visit. Compression as ordered Provide education on venous insufficiency Gift, Deldrick E ([id number]) [id number] Page 5 of 11 Treatment Activities: Non-invasive vascular studies : 03/11/2016 Therapeutic compression applied : 03/11/2016 Notes: Wound/Skin Impairment Nursing Diagnoses: Impaired tissue integrity Knowledge deficit related to ulceration/compromised skin integrity Goals: Patient/caregiver will verbalize understanding of skin care regimen Date Initiated: 06/01/2022 Target Resolution Date: 11/18/2023 Goal Status: Active Ulcer/skin breakdown will have [name] volume reduction of 30% by week 4 Date Initiated: 06/01/2022 Date Inactivated: 06/29/2022 Target Resolution Date: 06/29/2022 Unmet Reason: complex wounds, Goal Status: Unmet infection Interventions: Assess patient/caregiver ability to obtain necessary supplies Assess ulceration(s) every visit Provide education on ulcer and skin care Notes: 02/02/21: Complex Care, ongoing. Electronic Signature(s) Signed: 10/26/2023 11:53:29 AM By: Zenaida Deed RN, BSN Entered By: Zenaida Deed on 10/25/2023 08:13:23 -------------------------------------------------------------------------------- Pain Assessment Details Patient Name: Date of Service: [name], [name] [name] 10/25/2023 8:15 [name] Medical Record Number: [id number] Patient Account Number: [id number] Date of Birth/Sex: Treating RN: 30-Apr-1986 (  37 y.o. Damaris Schooner Primary Care Nayomi Tabron: [name], [name] Other  Clinician: Referring Sharlon Pfohl: Treating Damin Salido/Extender: Marcelline Mates [name]: [phone] Active Problems Location of Pain Severity and Description of Pain Patient Has Paino No Site Locations Rate the pain. Current Pain Level: 0 Pain Management and Medication Current Pain Management: Schlagel, Mitchel E ([id number]) [id number] Page 6 of 11 Electronic Signature(s) Signed: 10/26/2023 11:53:29 AM By: Zenaida Deed RN, BSN Entered By: Zenaida Deed on 10/25/2023 08:32:31 -------------------------------------------------------------------------------- Patient/Caregiver Education Details Patient Name: Date of Service: Ackerley, [name] [name] 12/3/2024andnbsp8:15 [name] Medical Record Number: [id number] Patient Account Number: [id number] Date of Birth/Gender: Treating RN: [birth year] (37 y.o. Damaris Schooner Primary Care Physician: Eunice Blase Other Clinician: Referring Physician: Treating Physician/Extender: Marcelline Mates [name]: [phone] Education Assessment Education Provided To: Patient Education Topics Provided Pressure: Methods: Explain/Verbal Responses: Reinforcements needed, State content correctly Venous: Methods: Explain/Verbal Responses: Reinforcements needed, State content correctly Wound/Skin Impairment: Methods: Explain/Verbal Responses: Reinforcements needed, State content correctly Electronic Signature(s) Signed: 10/26/2023 11:53:29 AM By: Zenaida Deed RN, BSN Entered By: Zenaida Deed on 10/25/2023 08:15:44 -------------------------------------------------------------------------------- Wound Assessment Details Patient Name: Date of Service: [name], [name] [name] 10/25/2023 8:15 [name] Medical Record Number: [id number] Patient Account Number: [id number] Date of Birth/Sex: Treating RN: 1986/04/14 (37 y.o. Damaris Schooner Primary Care Delane Wessinger: [name], [name] Other Clinician: Referring  Shamirah Ivan: Treating Bellah Alia/Extender: Marcelline Mates [name]: 407 Wound Status Wound Number: [id number] Primary Etiology: Venous Leg Ulcer Wound Location: Right, Dorsal Foot Wound Status: Open Wounding Event: Gradually Appeared Comorbid History: Sleep Apnea, Hypertension, Paraplegia Date Acquired: 03/27/2021 Weeks Of Treatment: 134 Clustered Wound: No Wound Measurements Length: (cm) 1.4 Christianson, Keeon E ([id number]) Width: (cm) 5. Depth: (cm) 0. Area: (cm) 5 Volume: (cm) 0 % Reduction in Area: -215% [id number] Page 7 of 11 4 % Reduction in Volume: -216% 1 Epithelialization: Small (1-33%) .938 Tunneling: No .594 Undermining: No Wound Description Classification: Full Thickness Without Exposed Suppor Wound Margin: Distinct, outline attached Exudate Amount: Medium Exudate Type: Serous Exudate Color: amber t Structures Foul Odor After Cleansing: No Slough/Fibrino Yes Wound Bed Granulation Amount: Small (1-33%) Exposed Structure Granulation Quality: Pink Fascia Exposed: No Necrotic Amount: Large (67-100%) Fat Layer (Subcutaneous Tissue) Exposed: Yes Necrotic Quality: Adherent Slough Tendon Exposed: No Muscle Exposed: No Joint Exposed: No Bone Exposed: No Periwound Skin Texture Texture Color No Abnormalities Noted: Yes No Abnormalities Noted: Yes Moisture Temperature / Pain No Abnormalities Noted: No Temperature: No Abnormality Dry / Scaly: No Maceration: Yes Electronic Signature(s) Signed: 10/26/2023 11:53:29 AM By: Zenaida Deed RN, BSN Entered By: Zenaida Deed on 10/26/2023 08:36:51 -------------------------------------------------------------------------------- Wound Assessment Details Patient Name: Date of Service: [name], [name] [name] 10/25/2023 8:15 [name] Medical Record Number: [id number] Patient Account Number: [id number] Date of Birth/Sex: Treating RN: [birth year] (37 y.o. Damaris Schooner Primary Care Dejion Grillo:  [name], [name] Other Clinician: Referring Amiayah Giebel: Treating Hunter Bachar/Extender: Marcelline Mates [name]: 407 Wound Status Wound Number: 56 Primary Etiology: Neuropathic Ulcer-Non Diabetic Wound Location: Left, Dorsal Foot Wound Status: Open Wounding Event: Gradually Appeared Comorbid History: Sleep Apnea, Hypertension, Paraplegia Date Acquired: 07/11/2021 Weeks Of Treatment: 118 Clustered Wound: Yes Wound Measurements Length: (cm) 1.5 Width: (cm) 2.5 Depth: (cm) 0.1 Area: (cm) 2.945 Volume: (cm) 0.295 % Reduction in Area: 52.7% % Reduction in Volume: 52.6% Epithelialization: Small (1-33%) Tunneling: No Undermining: No Wound Description Classification: Full Thickness Without Exposed Suppor Wound Margin: Flat and Intact Exudate Amount: Medium Exudate Type: Serous Exudate Color:  amber t Structures Foul Odor After Cleansing: No Slough/Fibrino Yes Wound Bed Ryce, Almus E ([id number]) [id number] Page 8 of 11 Granulation Amount: Small (1-33%) Exposed Structure Granulation Quality: Pink Fascia Exposed: No Necrotic Amount: Large (67-100%) Fat Layer (Subcutaneous Tissue) Exposed: Yes Necrotic Quality: Adherent Slough Tendon Exposed: No Muscle Exposed: No Joint Exposed: No Bone Exposed: No Periwound Skin Texture Texture Color No Abnormalities Noted: No No Abnormalities Noted: No Excoriation: Yes Erythema: No Rubor: Yes Moisture No Abnormalities Noted: No Temperature / Pain Dry / Scaly: No Temperature: No Abnormality Maceration: Yes Electronic Signature(s) Signed: 10/26/2023 11:53:29 AM By: Zenaida Deed RN, BSN Entered By: Zenaida Deed on 10/26/2023 08:37:52 -------------------------------------------------------------------------------- Wound Assessment Details Patient Name: Date of Service: [name], [name] [name] 10/25/2023 8:15 [name] Medical Record Number: [id number] Patient Account Number: [id number] Date of  Birth/Sex: Treating RN: Mar 31, 1986 (37 y.o. Damaris Schooner Primary Care Denzell Colasanti: [name], [name] Other Clinician: Referring Johnnathan Hagemeister: Treating Jozi Malachi/Extender: Marcelline Mates [name]: 407 Wound Status Wound Number: 67 Primary Etiology: Venous Leg Ulcer Wound Location: Left, Lateral Lower Leg Wound Status: Open Wounding Event: Gradually Appeared Comorbid History: Sleep Apnea, Hypertension, Paraplegia Date Acquired: 06/22/2022 Weeks Of Treatment: 69 Clustered Wound: No Wound Measurements Length: (cm) 2 Width: (cm) 0.9 Depth: (cm) 0.1 Area: (cm) 1.414 Volume: (cm) 0.141 % Reduction in Area: 74.8% % Reduction in Volume: 74.9% Epithelialization: Small (1-33%) Tunneling: No Undermining: No Wound Description Classification: Full Thickness Without Exposed Support Structures Wound Margin: Distinct, outline attached Exudate Amount: Medium Exudate Type: Serous Exudate Color: amber Foul Odor After Cleansing: No Slough/Fibrino No Wound Bed Granulation Amount: Small (1-33%) Exposed Structure Granulation Quality: Red Fascia Exposed: No Necrotic Amount: Large (67-100%) Fat Layer (Subcutaneous Tissue) Exposed: Yes Necrotic Quality: Adherent Slough Tendon Exposed: No Muscle Exposed: No Joint Exposed: No Bone Exposed: No Periwound Skin Texture Texture Color No Abnormalities Noted: No No Abnormalities Noted: Yes Scarring: Yes Gessel, Aidynn E ([id number]) [id number] Page 9 of 11 Scarring: Yes Temperature / Pain Temperature: No Abnormality Moisture No Abnormalities Noted: No Dry / Scaly: Yes Electronic Signature(s) Signed: 10/26/2023 11:53:29 AM By: Zenaida Deed RN, BSN Entered By: Zenaida Deed on 10/26/2023 08:38:32 -------------------------------------------------------------------------------- Wound Assessment Details Patient Name: Date of Service: [name], [name] [name] 10/25/2023 8:15 [name] Medical Record Number:  [id number] Patient Account Number: [id number] Date of Birth/Sex: Treating RN: 1986/04/04 (37 y.o. Damaris Schooner Primary Care Astaria Nanez: [name], [name] Other Clinician: Referring Sahmya Arai: Treating Merrilee Ancona/Extender: Marcelline Mates [name]: 407 Wound Status Wound Number: 74 Primary Etiology: Pressure Ulcer Wound Location: Left, Posterior Upper Leg Wound Status: Open Wounding Event: Pressure Injury Comorbid History: Sleep Apnea, Hypertension, Paraplegia Date Acquired: 01/10/2023 Weeks Of Treatment: 40 Clustered Wound: Yes Wound Measurements Length: (cm) 11.3 Width: (cm) 6.6 Depth: (cm) 0.1 Clustered Quantity: 2 Area: (cm) 58.575 Volume: (cm) 5.857 % Reduction in Area: 21.5% % Reduction in Volume: 21.5% Epithelialization: Small (1-33%) Tunneling: No Undermining: No Wound Description Classification: Category/Stage III Wound Margin: Flat and Intact Exudate Amount: Large Exudate Type: Purulent Exudate Color: yellow, brown, green Foul Odor After Cleansing: Yes Due to Product Use: No Slough/Fibrino No Wound Bed Granulation Amount: Large (67-100%) Exposed Structure Granulation Quality: Red, Hyper-granulation, Friable Fascia Exposed: No Necrotic Amount: Small (1-33%) Fat Layer (Subcutaneous Tissue) Exposed: Yes Necrotic Quality: Adherent Slough Tendon Exposed: No Muscle Exposed: No Joint Exposed: No Bone Exposed: No Periwound Skin Texture Texture Color No Abnormalities Noted: No No Abnormalities Noted: Yes Scarring: Yes Temperature / Pain Temperature: No Abnormality Moisture No  Abnormalities Noted: Yes Electronic Signature(s) Signed: 10/26/2023 11:53:29 AM By: Zenaida Deed RN, BSN Entered By: Zenaida Deed on 10/26/2023 08:39:35 Casale, [name] ([id number]) [id number] Page 10 of 11 -------------------------------------------------------------------------------- Wound Assessment Details Patient Name: Date of  Service: [name], [name] [name] 10/25/2023 8:15 [name] Medical Record Number: [id number] Patient Account Number: [id number] Date of Birth/Sex: Treating RN: 01/07/1986 (37 y.o. Damaris Schooner Primary Care Brendaliz Kuk: [name], [name] Other Clinician: Referring Orvella Digiulio: Treating Pearlina Friedly/Extender: Marcelline Mates [name]: 407 Wound Status Wound Number: 78 Primary Etiology: Venous Leg Ulcer Wound Location: Right, Anterior Ankle Wound Status: Open Wounding Event: Gradually Appeared Comorbid History: Sleep Apnea, Hypertension, Paraplegia Date Acquired: 07/04/2023 Weeks Of Treatment: 15 Clustered Wound: No Wound Measurements Length: (cm) 0.4 Width: (cm) 0.4 Depth: (cm) 0.1 Area: (cm) 0.126 Volume: (cm) 0.013 % Reduction in Area: 93% % Reduction in Volume: 92.8% Epithelialization: Medium (34-66%) Tunneling: No Undermining: No Wound Description Classification: Full Thickness Without Exposed Support Structures Wound Margin: Flat and Intact Exudate Amount: Small Exudate Type: Serosanguineous Exudate Color: red, brown Foul Odor After Cleansing: No Slough/Fibrino No Wound Bed Granulation Amount: Large (67-100%) Exposed Structure Granulation Quality: Red Fascia Exposed: No Necrotic Amount: Small (1-33%) Fat Layer (Subcutaneous Tissue) Exposed: Yes Necrotic Quality: Eschar Tendon Exposed: No Muscle Exposed: No Joint Exposed: No Bone Exposed: No Periwound Skin Texture Texture Color No Abnormalities Noted: No No Abnormalities Noted: Yes Scarring: Yes Temperature / Pain Temperature: No Abnormality Moisture No Abnormalities Noted: Yes Electronic Signature(s) Signed: 10/26/2023 11:53:29 AM By: Zenaida Deed RN, BSN Entered By: Zenaida Deed on 10/26/2023 08:40:25 -------------------------------------------------------------------------------- Wound Assessment Details Patient Name: Date of Service: [name], [name] [name] 10/25/2023 8:15 [name] Medical Record Number:  [id number] Patient Account Number: [id number] Date of Birth/Sex: Treating RN: Mar 24, 1986 (37 y.o. Damaris Schooner Primary Care Cevin Rubinstein: [name], [name] Other Clinician: Referring Omare Bilotta: Treating Sherese Heyward/Extender: Marcelline Mates [name]: [address], [name] ([id number]) [id number] Page 11 of 11 Wound Status Wound Number: [id number] Primary Etiology: Lymphedema Wound Location: Right, Plantar Foot Wound Status: Open Wounding Event: Gradually Appeared Comorbid History: Sleep Apnea, Hypertension, Paraplegia Date Acquired: 09/23/2023 Weeks Of Treatment: 4 Clustered Wound: No Wound Measurements Length: (cm) 2 Width: (cm) 4 Depth: (cm) 0.1 Area: (cm) 6.283 Volume: (cm) 0.628 % Reduction in Area: -1042.4% % Reduction in Volume: -1041.8% Epithelialization: Medium (34-66%) Tunneling: No Undermining: No Wound Description Classification: Full Thickness Without Exposed Suppor Wound Margin: Flat and Intact Exudate Amount: Medium Exudate Type: Serous Exudate Color: amber t Structures Foul Odor After Cleansing: Yes Due to Product Use: No Slough/Fibrino Yes Wound Bed Granulation Amount: Large (67-100%) Exposed Structure Granulation Quality: Pink Fascia Exposed: No Necrotic Amount: Small (1-33%) Fat Layer (Subcutaneous Tissue) Exposed: Yes Necrotic Quality: Adherent Slough Tendon Exposed: No Muscle Exposed: No Joint Exposed: No Bone Exposed: No Periwound Skin Texture Texture Color No Abnormalities Noted: Yes No Abnormalities Noted: Yes Moisture No Abnormalities Noted: No Maceration: Yes Electronic Signature(s) Signed: 10/26/2023 11:53:29 AM By: Zenaida Deed RN, BSN Entered By: Zenaida Deed on 10/26/2023 08:41:10 -------------------------------------------------------------------------------- Vitals Details Patient Name: Date of Service: Sacks, [name] [name] 10/25/2023 8:15 [name] Medical Record Number: [id number] Patient Account  Number: [id number] Date of Birth/Sex: Treating RN: Jan 28, 1986 (37 y.o. Damaris Schooner Primary Care Linsie Lupo: [name], [name] Other Clinician: Referring Jolly Bleicher: Treating Opie Maclaughlin/Extender: Anabel Halon, [name]: 407 Vital Signs Time Taken: 08:30 Temperature (F): 98.3 Height (in): 70 Pulse (bpm): 130 Weight (lbs): 216 Respiratory Rate (breaths/min): 18 Body Mass Index (  BMI): 31 Blood Pressure (mmHg): 145/83 Reference Range: 80 - 120 mg / dl Electronic Signature(s) Signed: 10/26/2023 11:53:29 AM By: Zenaida Deed RN, BSN Entered By: Zenaida Deed on 10/25/2023 08:31:19

## 2023-10-30 NOTE — Progress Notes (Signed)
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 have been using Hydrofera Blue and again making good progress With regards to the wounds between his right first and second toe which I thought might of been tinea pedis he is not making as much progress very dry scaly skin over the area. Also the area at the base of the left first and second toe in [name] similar condition 05/12/17; continued gradual improvement in the refractory left lateral venous insufficiency wound on the left. Dimension smaller. Surface still requiring debridement using Hydrofera Blue 05/19/17; continued gradual improvement in the refractory left lateral venous ulceration. Careful inspection of the wound bed underlying rumination suggested some degree of epithelialization over the surface no debridement indicated. Continue Hydrofera Blue difficult areas between his toes first and third on the left than first and second on the right. I'm going to change to silver alginate from silver collagen. Continue ketoconazole as I suspect underlying tinea pedis 05/26/17; left lateral leg venous insufficiency wound. We've been using Hydrofera Blue. I believe that there is expanding epithelialization over the surface of the wound albeit not coming from the wound circumference. This is [name] bit of an odd situation in which the epithelialization seems to be coming from the surface of the wound rather than in the exact circumference. There is still small open areas mostly along the lateral margin of the wound. He has unchanged areas between the  left first and second and the right first second toes which I been treating for tenia pedis 06/02/17; left lateral leg venous insufficiency wound. We have been using Hydrofera Blue. Somewhat smaller from the wound circumference. The surface of the wound remains [name] bit on it almost epithelialized sedation in appearance. I use an open curette today debridement in the surface of all of  this especially the edges Small open wounds remaining on the dorsal right first and second toe interspace and the plantar left first second toe and her face on the left 06/09/17; wound on the left lateral leg continues to be smaller but very gradual and very dry surface using Hydrofera Blue 06/16/17 requires weekly debridements now on the left lateral leg although this continues to contract. I changed to silver collagen last week because of dryness of the wound bed. Using Iodoflex to the areas on his first and second toes/web space bilaterally 06/24/17; patient with history of paraplegia also chronic venous insufficiency with lymphedema. Has [name] very difficult wound on the left lateral leg. This has been gradually reducing in terms of with but comes in with [name] very dry adherent surface. High switch to silver collagen [name] week or so ago with hydrogel to keep the area moist. This is been refractory to multiple dressing attempts. He also has areas in his first and second toes bilaterally in the anterior and posterior web space. I had been using Iodoflex here after [name] prolonged course of silver alginate with ketoconazole was ineffective [question tinea pedis] 07/14/17; patient arrives today with [name] very difficult adherent material over his left lateral lower leg wound. He also has surrounding erythema and poorly controlled edema. He was switched his Santyl last visit which the nurses are applying once during his doctor visit and once on [name] nurse visit. He was also reduced to 2 layer compression I'm not exactly sure of the issue  here. 07/21/17; better surface today after 1 week of Iodoflex. Significant cellulitis that we treated last week also better. [Doxycycline] 07/28/17 better surface today with now 2 weeks of Iodoflex. Significant cellulitis treated with doxycycline. He has now completed the doxycycline and he is back to his usual degree of chronic venous inflammation/stasis dermatitis. He reminds me he has had ablations surgery here 08/04/17; continued improvement with Iodoflex to the left lateral leg wound in terms of the surface of the wound although the dimensions are better. 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 Problematic areas on the plantar aspect of the first second toe web space on the left and the dorsal aspect of the first second toe web space on the right. At one point I felt these were probably related to chronic fungal infections in treated him aggressively for this although we have not made any improvement here. 08/11/17; left lateral leg. Surface continues to improve with the Iodoflex although we are not seeing much improvement in overall wound dimensions. Areas on his plantar left foot and right foot show no improvement. In fact the right foot looks somewhat worse 08/18/17; left lateral leg. We changed to [facility] Blue last week after [name] prolonged course of Iodoflex which helps get the surface better. It appears that the wound with is improved. Continue with difficult areas on the left dorsal first second and plantar first second on the right 09/01/17; patient arrives in clinic today having had [name] temperature of 103 yesterday. He was seen in the ER and [facility]. The patient was concerned he could have cellulitis again in the right leg however they diagnosed him with [name] UTI and he is now on Keflex. He has [name] history of cellulitis which is been recurrent and difficult but this is been in the left leg, in the past 5 use doxycycline. He does  in and out catheterizations at home which are risk factors for UTI  09/08/17; patient will be completing his Keflex this weekend. The erythema on the left leg is considerably better. He has [name] new wound today on the medial part of the right leg small superficial almost looks like [name] skin tear. He has worsening of the area on the right dorsal first and second toe. His major area on the left lateral leg is better. Using [facility] on all areas Lumm, Glennon [name] ([id number]) [id number] Page 5 of 35 09/15/17; gradual reduction in width on the long wound in the left lateral leg. No debridement required. He also has wounds on the plantar aspect of his left first second toe web space and on the dorsal aspect of the right first second toe web space. 09/22/17; there continues to be very gradual improvements in the dimensions of the left lateral leg wound. He hasn't round erythematous spot with might be pressure on his wheelchair. There is no evidence obviously of infection no purulence no warmth He has [name] dry scaled area on the plantar aspect of the left first second toe Improved area on the dorsal right first second toe. 09/29/17; left lateral leg wound continues to improve in dimensions mostly with an is still [name] fairly long but increasingly narrow wound. He has [name] dry scaled area on the plantar aspect of his left first second toe web space Increasingly concerning area on the dorsal right first second toe. In fact I am concerned today about possible cellulitis around this wound. The areas extending up his second toe and although there is deformities here almost appears to abut on the nailbed. 10/06/17; left lateral leg wound continues to make very gradual progress. Tissue culture I did from the right first second toe dorsal foot last time grew MRSA and enterococcus which was vancomycin sensitive. This was not sensitive to clindamycin or doxycycline. He is allergic to Zyvox and sulfa we  have therefore arrange for him to have dalvance infusion tomorrow. He is had this in the past and tolerated it well 10/20/17; left lateral leg wound continues to make decent progress. This is certainly reduced in terms of with there is advancing epithelialization.The cellulitis in the right foot looks better although he still has [name] deep wound in the dorsal aspect of the first second toe web space. Plantar left first toe web space on the left I think is making some progress 10/27/17; left lateral leg wound continues to make decent progress. Advancing epithelialization.using Hydrofera Blue The right first second toe web space wound is better-looking using silver alginate Improvement in the left plantar first second toe web space. Again using silver alginate 11/03/17 left lateral leg wound continues to make decent progress albeit slowly. Using [facility] The right per second toe web space continues to be [name] very problematic looking punched out wound. I obtained [name] piece of tissue for deep culture I did extensively treated this for fungus. It is difficult to imagine that this is [name] pressure area as the patient states other than going outside he doesn't really wear shoes at home The left plantar first second toe web space looked fairly senescent. Necrotic edges. This required debridement change to [facility] Blue to all wound areas 11/10/17; left lateral leg wound continues to contract. Using Hydrofera Blue On the right dorsal first second toe web space dorsally. Culture I did of this area last week grew MRSA there is not an easy oral option in this patient was multiple antibiotic allergies or intolerances. This was only [name] rare culture isolate I'm therefore  going to use Bactroban under silver alginate On the left plantar first second toe web space. Debridement is required here. This is also unchanged 11/17/17; left lateral leg wound continues to contract using Hydrofera Blue this is no longer the major  issue. The major concern here is the right first second toe web space. He now has an open area going from dorsally to the plantar aspect. There is now wound on the inner lateral part of the first toe. Not [name] very viable surface on this. There is erythema spreading medially into the forefoot. No major change in the left first second toe plantar wound 11/24/17; left lateral leg wound continues to contract using Hydrofera Blue. Nice improvement today The right first second toe web space all of this looks [name] lot less angry than last week. I have given him clindamycin and topical Bactroban for MRSA and terbinafine for the possibility of underlining tinea pedis that I could not control with ketoconazole. Looks somewhat better The area on the plantar left first second toe web space is weeping with dried debris around the wound 12/01/17; left lateral leg wound continues to contract he Hydrofera Blue. It is becoming thinner in terms of with nevertheless it is making good improvement. The right first second toe web space looks less angry but still [name] large necrotic-looking wounds starting on the plantar aspect of the right foot extending between the toes and now extensively on the base of the right second toe. I gave him clindamycin and topical Bactroban for MRSA anterior benefiting for the possibility of underlying tinea pedis. Not looking better today The area on the left first/second toe looks better. Debrided of necrotic debris 12/05/17* the patient was worked in urgently today because over the weekend he found blood on his incontinence bad when he woke up. He was found to have an ulcer by his wife who does most of his wound care. He came in today for [place] to look at this. He has not had [name] history of wounds in his buttocks in spite of his paraplegia. 12/08/17; seen in follow-up today at his usual appointment. He was seen earlier this week and found to have [name] new wound on his buttock. We also follow him for wounds  on the left lateral leg, left first second toe web space and right first second toe web space 12/15/17; we have been using Hydrofera Blue to the left lateral leg which has improved. The right first second toe web space has also improved. Left first second toe web space plantar aspect looks stable. The left buttock has worsened using Santyl. Apparently the buttock has drainage 12/22/17; we have been using Hydrofera Blue to the left lateral leg which continues to improve now 2 small wounds separated by normal skin. He tells [place] he had [name] fever up to 100 yesterday he is prone to UTIs but has not noted anything different. He does in and out catheterizations. The area between the first and second toes today does not look good necrotic surface covered with what looks to be purulent drainage and erythema extending into the third toe. I had gotten this to something that I thought look better last time however it is not look good today. He also has [name] necrotic surface over the buttock wound which is expanded. I thought there might be infection under here so I removed [name] lot of the surface with [name] #5 curet though nothing look like it really needed culturing. He is been using Santyl to this  area 12/27/17; his original wound on the left lateral leg continues to improve using Hydrofera Blue. I gave him samples of Baxdella although he was unable to take them out of fear for an allergic reaction ["lump in his throat"].the culture I did of the purulent drainage from his second toe last week showed both enterococcus and [name] set Enterobacter I was also concerned about the erythema on the bottom of his foot although paradoxically although this looks somewhat better today. 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 We've been using silver alginate here. They came up today that he sleeps in [name] recliner, I'm not sure why but I asked him to stop  this 01/03/18; his original wound we've been using Hydrofera Blue is now separated into 2 areas. Ulcer on his left buttock is better he is off the recliner and sleeping in bed Finally both wound areas between his first and second toes also looks some better 01/10/18; his original wound on the left lateral leg is now separated into 2 wounds we've been using Hydrofera Blue Ulcer on his left buttock has some drainage. There is [name] small probing site going into muscle layer superiorly.using silver alginate -He arrives today with [name] deep tissue injury on the left heel The wound on the dorsal aspect of his first second toe on the left looks [name] lot betterusing silver alginate ketoconazole The area on the first second toe web space on the right also looks [name] lot bette 01/17/18; his original wound on the left lateral leg continues to progress using Hydrofera Blue Ulcer on his left buttock also is smaller surface healthier except for [name] small probing site going into the muscle layer superiorly. 2.4 cm of tunneling in this area DTI on his left heel we have only been offloading. Looks better than last week no threatened open no evidence of infection the wound on the dorsal aspect of the first second toe on the left continues to look like it's regressing we have only been using silver alginate and terbinafine orally The area in the first second toe web space on the right also looks to be [name] lot better using silver alginate and terbinafine I think this was prompted by tinea pedis 01/31/18; the patient was hospitalized in [place] last week apparently for [name] complicated UTI. He was discharged on cefepime he does in and out catheterizations. In the hospital he was discovered M I don't mild elevation of AST and ALT and the terbinafine was stopped.predictably the pressure ulcer on s his buttock looks betterusing silver alginate. The area on the left lateral leg also is better using Hydrofera Blue. The area between the first  and second toes on the left better. First and second toes on the right still substantial but better. Finally the DTI on the left heel has held together and looks like it's resolving 02/07/18-he is here in follow-up evaluation for multiple ulcerations. He has new injury to the lateral aspect of the last issue [name] pressure ulcer, he states this is from adhesive removal trauma. He states he has tried multiple adhesive products with no success. All other ulcers appear stable. The left heel DTI is resolving. We will continue with same treatment plan and follow-up next week. 02/14/18; follow-up for multiple areas. He has [name] new area last week on the lateral aspect of his pressure ulcer more over the posterior trochanter. The original pressure ulcer looks quite stable  has Loden, Marquee [name] ([id number]) [id number] Page 6 of 35 healthy granulation. 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 Almost fully epithelialized on the original superior area using Hydrofera Blue DTI on the left heel has peeled off this week to reveal [name] small superficial wound under denuded skin and subcutaneous tissue Both areas between the first and second toes look better including nothing open on the left 02/21/18; The patient's wounds on his left ischial tuberosity and posterior left greater trochanter actually looked better. He has [name] large area of irritation around the area which I think is contact dermatitis. I am doubtful that this is fungal His original wound on the left lateral calf continues to improve we have been using Hydrofera Blue There is no open area in the left first second toe web space although there is [name] lot of thick callus The DTI on the left heel required debridement today of necrotic surface eschar and subcutaneous tissue using silver alginate Finally the area on the right first second toe  webspace continues to contract using silver alginate and ketoconazole 02/28/18 Left ischial tuberosity wounds look better using silver alginate. Original wound on the left calf only has one small open area left using Hydrofera Blue DTI on the left heel required debridement mostly removing skin from around this wound surface. Using silver alginate The areas on the right first/second toe web space using silver alginate and ketoconazole 03/08/18 on evaluation today patient appears to be doing decently well as best I can tell in regard to his wounds. This is the first time that I have seen him as he generally is followed by Dr. Leanord Hawking. With that being said none of his wounds appear to be infected he does have an area where there is some skin covering what appears to be [name] new wound on the left dorsal surface of his great toe. This is right at the nail bed. With that being said I do believe that debrided away some of the excess skin can be of benefit in this regard. Otherwise he has been tolerating the dressing changes without complication. 03/14/18; patient arrives today with the multiplicity of wounds that we are following. He has not been systemically unwell Original wound on the left lateral calf now only has 2 small open areas we've been using Hydrofera Blue which should continue The deep tissue injury on the left heel requires debridement today. We've been using silver alginate The left first second toe and the right first second toe are both are reminiscence what I think was tinea pedis. Apparently some of the callus Surface between the toes was removed last week when it started draining. Purulent drainage coming from the wound on the ischial tuberosity on the left. 03/21/18-He is here in follow-up evaluation for multiple wounds. 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 He tolerated debridement. The only change to last week's  recommendations is to discontinue antifungal cream between toes. He will follow-up next week 03/28/18; following up for multiple wounds;Concern this week is streaking redness and swelling in the right foot. He is going to need antibiotics for this. 03/31/18; follow-up for right foot cellulitis. Streaking redness and swelling in the right foot on 03/28/18. 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 I put him on clindamycin 300 mg every 6 and brought him in for [name] quick check. He  has an open wound between his first and second toes on the right foot as [name] potential source. 04/04/18; Right foot cellulitis is resolving he is completing clindamycin. This is truly good news Left lateral calf wound which is initial wound only has one small open area inferiorly this is close to healing out. He has compression stockings. We will use Hydrofera Blue right down to the epithelialization of this Nonviable surface on the left heel which was initially pressure with [name] DTI. We've been using Hydrofera Blue. I'm going to switch this back to silver alginate Left first second toe/tinea pedis this looks better using silver alginate Right first second toe tinea pedis using silver alginate Large pressure ulcers on theLeft ischial tuberosity. Small wound here Looks better. I am uncertain about the surface over the large wound. Using silver alginate 04/11/18; Cellulitis in the right foot is resolved Left lateral calf wound which was his original wounds still has 2 tiny open areas remaining this is just about closed Nonviable surface on the left heel is better but still requires debridement Left first second toe/tinea pedis still open using silver alginate Right first second toe wound tinea pedis I asked him to go back to using ketoconazole and silver alginate Large pressure ulcers on the left ischial tuberosity this shear injury here is resolved. Wound is smaller. No evidence of infection using silver  alginate 04/18/18; Patient arrives with an intense area of cellulitis in the right mid lower calf extending into the right heel area. Bright red and warm. Smaller area on the left anterior leg. He has [name] significant history of MRSA. He will definitely need antibioticsdoxycycline He now has 2 open areas on the left ischial tuberosity the original large wound and now [name] satellite area which I think was above his initial satellite areas. Not [name] wonderful surface on this satellite area surrounding erythema which looks like pressure related. His left lateral calf wound again his original wound is just about closed Left heel pressure injury still requiring debridement Left first second toe looks [name] lot better using silver alginate Right first second toe also using silver alginate and ketoconazole cream also looks better 04/20/18; the patient was worked in early today out of concerns with his cellulitis on the right leg. I had started him on doxycycline. This was 2 days ago. His wife was concerned about the swelling in the area. Also concerned about the left buttock. He has not been systemically unwell no fever chills. No nausea vomiting or diarrhea 04/25/18; the patient's left buttock wound is continued to deteriorate he is using Hydrofera Blue. He is still completing clindamycin for the cellulitis on the right leg although all of this looks better. 05/02/18 Left buttock wound still with [name] lot of drainage and [name] very tightly adherent fibrinous necrotic surface. He has [name] deeper area superiorly The left lateral calf wound is still closed DTI wound on the left heel necrotic surface especially the circumference using Iodoflex Areas between his left first second toe and right first second toe both look better. Dorsally and the right first second toe he had [name] necrotic surface although at smaller. In using silver alginate and ketoconazole. I did [name] culture last week which was [name] deep tissue culture of the reminiscence  of the open wound on the right first second toe dorsally. This grew [name] few Acinetobacter and [name] few methicillin-resistant staph aureus. Nevertheless the area actually this week looked better. I didn't feel the need to specifically address this at least  in terms of systemic antibiotics. 05/09/18; wounds are measuring larger more drainage per our intake. We are using Santyl covered with alginate on the large superficial buttock wounds, Iodosorb on the left heel, ketoconazole and silver alginate to the dorsal first and second toes bilaterally. 05/16/18; The area on his left buttock better in some aspects although the area superiorly over the ischial tuberosity required an extensive debridement.using Santyl Left heel appears stable. Using Iodoflex The areas between his first and second toes are not bad however there is spreading erythema up the dorsal aspect of his left foot this looks like cellulitis again. He is insensate the erythema is really very brilliant.o Erysipelas He went to see an allergist days ago because he was itching part of this he had lab work done. This showed [name] white count of 15.1 with 70% neutrophils. Hemoglobin of 11.4 and [name] platelet count of 659,000. Last white count we had in Epic was [name] 2-1/2 years ago which was 25.9 but he was ill at the time. He was able to show me some lab work that was done by his primary physician the pattern is about the same. 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 But prompted me to go ahead and do x-rays of both feet and the pelvis rule out osteomyelitis. He also had [name] comprehensive metabolic panel this was reasonably normal his albumin was 3.7 liver function tests BUN/creatinine all normal 05/23/18; x-rays of both his feet from last week were negative for underlying pulmonary abnormality. The x-ray of his pelvis however showed mild irregularity in the left ischial which may represent some early osteomyelitis. The  wound in the left ischial continues to get deeper clearly now exposed muscle. Each week necrotic surface material over this area. Whereas the rest of the wounds do not look so bad. The left ischial wound we have been using Santyl and calcium alginate Cirilo, Gemini [name] ([id number]) [id number] Page 7 of 35 T the left heel surface necrotic debris using Iodoflex o The left lateral leg is still healed Areas on the left dorsal foot and the right dorsal foot are about the same. There is some inflammation on the left which might represent contact dermatitis, fungal dermatitis I am doubtful cellulitis although this looks better than last week 05/30/18; CT scan done at Hospital did not show any osteomyelitis or abscess. Suggested the possibility of underlying cellulitis although I don't see [name] lot of evidence of this at the bedside The wound itself on the left buttock/upper thigh actually looks somewhat better. No debridement Left heel also looks better no debridement continue Iodoflex Both dorsal first second toe spaces appear better using Lotrisone. Left still required debridement 06/06/18; Intake reported some purulent looking drainage from the left gluteal wound. Using Santyl and calcium alginate Left heel looks better although still [name] nonviable surface requiring debridement The left dorsal foot first/second webspace actually expanding and somewhat deeper. I may consider doing [name] shave biopsy of this area Right dorsal foot first/second webspace appears stable to improved. Using Lotrisone and silver alginate to both these areas 06/13/18 Left gluteal surface looks better. Now separated in the 2 wounds. No debridement required. Still drainage. We'll continue silver alginate Left heel continues to look better with Iodoflex continue this for at least another week Of his dorsal foot wounds the area on the left still has some depth although it looks better than last week. We've been using  Lotrisone and silver alginate 06/20/18  Left gluteal continues to look better healthy tissue Left heel continues to look better healthy granulation wound is smaller. He is using Iodoflex and his long as this continues continue the Iodoflex Dorsal right foot looks better unfortunately dorsal left foot does not. There is swelling and erythema of his forefoot. He had minor trauma to this several days ago but doesn't think this was enough to have caused any tissue injury. Foot looks like cellulitis, we have had this problem before 06/27/18 on evaluation today patient appears to be doing [name] little worse in regard to his foot ulcer. 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 Both of which would really be his only options for treating this infection. In the past he has been given and effusion of Orbactiv. This is done very well for him in the past again it's one time dosing IV antibiotic therapy. Subsequently I do believe this is something we're gonna need to see about doing at this point in time. Currently his other wounds seem to be doing somewhat better in my pinion I'm pretty happy in that regard. 07/03/18 on evaluation today patient's wounds actually appear to be doing fairly well. He has been tolerating the dressing changes without complication. All in all he seems to be showing signs of improvement. In regard to the antibiotics he has been dealing with infectious disease since I saw him last week as far as getting this scheduled. In the end [facility]'s [facility] this coming Friday. In the meantime he has been continuing to perform the dressing changes in such as previous. There does not appear to be any evidence of infection worsengin at this time. 07/10/18; Since I last saw this man 2 weeks ago things have actually  improved. IV antibiotics of resulted in less forefoot erythema although there is still some present. He is not systemically unwell Left buttock wounds 2 now have no depth there is increased epithelialization Using silver alginate Left heel still requires debridement using Iodoflex Left dorsal foot still with [name] sizable wound about the size of [name] border but healthy granulation Right dorsal foot still with [name] slitlike area using silver alginate 07/18/18; the patient's cellulitis in the left foot is improved in fact I think it is on its way to resolving. Left buttock wounds 2 both look better although the larger one has hypertension granulation we've been using silver alginate Left heel has some thick circumferential redundant skin over the wound edge which will need to be removed today we've been using Iodoflex Left dorsal foot is still [name] sizable wound required debridement using silver alginate The right dorsal foot is just about closed only [name] small open area remains here 07/25/18; left foot cellulitis is resolved Left buttock wounds 2 both look better. Hyper-granulation on the major area Left heel as some debris over the surface but otherwise looks [name] healthier wound. Using silver collagen Right dorsal foot is just about closed 07/31/18; arrives with our intake nurse worried about purulent drainage from the buttock. We had hyper-granulation here last week His buttock wounds 2 continue to look better Left heel some debris over the surface but measuring smaller. Right dorsal foot unfortunately has openings between the toes Left foot superficial wound looks less aggravated. 08/07/18 Buttock wounds continue to look better although some of her  granulation and the larger medial wound. silver alginate Left heel continues to look [name] lot better.silver collagen Left foot superficial wound looks less stable. Requires debridement. He has [name] new wound superficial area on the foot on the lateral dorsal foot. Right foot  looks better using silver alginate without Lotrisone 08/14/2018; patient was in the ER last week diagnosed with [name] UTI. He is now on Cefpodoxime and Macrodantin. Buttock wounds continued to be smaller. Using silver alginate Left heel continues to look better using silver collagen Left foot superficial wound looks as though it is improving Right dorsal foot area is just about healed. 08/21/2018; patient is completed his antibiotics for his UTI. He has 2 open areas on the buttocks. There is still not closed although the surface looks satisfactory. Using silver alginate Left heel continues to improve using silver collagen The bilateral dorsal foot areas which are at the base of his first and second toes/possible tinea pedis are actually stable on the left but worse on the right. The area on the left required debridement of necrotic surface. After debridement I obtained [name] specimen for PCR culture. The right dorsal foot which is been just about healed last week is now reopened 08/28/2018; culture done on the left dorsal foot showed coag negative staph both staph epidermidis and Lugdunensis. I think this is worthwhile initiating systemic treatment. I will use doxycycline given his long list of allergies. The area on the left heel slightly improved but still requiring debridement. The large wound on the buttock is just about closed whereas the smaller one is larger. Using silver alginate in this area 09/04/2018; patient is completing his doxycycline for the left foot although this continues to be [name] very difficult wound area with very adherent necrotic debris. We are using silver alginate to all his wounds right foot left foot and the small wounds on his buttock, silver collagen on the left heel. 09/11/2018; once again this patient has intense erythema and swelling of the left forefoot. Lesser degrees of erythema in the right foot. He has [name] long list of allergies and intolerances. I will reinstitute  doxycycline. 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 Using silver alginate Left heel also looks better using silver collagen Unfortunately both the areas on his feet look worse. The area on the left first second webspace is now gone through to the plantar part of his foot. The area on the left foot anteriorly is irritated with erythema and swelling in the forefoot. 09/25/2018 His wound on the left plantar heel looks better. Using silver collagen The area on the left buttock 2 small remnant areas. One is closed one is still open. Using silver alginate The areas between both his first and second toes look worse. This in spite of long-standing antifungal therapy with ketoconazole and silver alginate which should have antifungal activity He has small areas around his original wound on the left calf one is on the bottom of the original scar tissue and one superiorly both of these are small and superficial but again given wound history in this site this is worrisome Chopin, Meredith [name] ([id number]) [id number] Page 8 of 35 10/02/2018 Left plantar heel continues to gradually contract using silver collagen Left buttock wound is unchanged using silver alginate The areas on his dorsal feet between his first and second toes bilaterally look about the same. I prescribed clindamycin ointment to see if we can address chronic staph colonization and also  the underlying possibility of erythrasma The left lateral lower extremity wound is actually on the lateral part of his ankle. Small open area here. We have been using silver alginate 10/09/2018; Left plantar heel continues to look healthy and contract. No debridement is required Left buttock slightly smaller with [name] tape injury wound just below which was new this week Dorsal feet somewhat improved I have been using clindamycin Left lateral looks lower extremity the actual open area looks  worse although [name] lot of this is epithelialized. I am going to change to silver collagen today He has [name] lot more swelling in the right leg although this is not pitting not red and not particularly warm there is [name] lot of spasm in the right leg usually indicative of people with paralysis of some underlying discomfort. We have reviewed his vascular status from 2017 he had [name] left greater saphenous vein ablation. I wonder about referring him back to vascular surgery if the area on the left leg continues to deteriorate. 10/16/2018 in today for follow-up and management of multiple lower extremity ulcers. His left Buttock wound is much lower smaller and almost closed completely. The wound to the left ankle has began to reopen with Epithelialization and some adherent slough. He has multiple new areas to the left foot and leg. The left dorsal foot without much improvement. Wound present between left great webspace and 2nd toe. Erythema and edema present right leg. Right LE ultrasound obtained on 10/10/18 was negative for DVT . 10/23/2018; Left buttock is closed over. Still dry macerated skin but there is no open wound. I suspect this is chronic pressure/moisture Left lateral calf is quite [name] bit worse than when I saw this last. There is clearly drainage here he has macerated skin into the left plantar heel. We will change the primary dressing to alginate Left dorsal foot has some improvement in overall wound area. Still using clindamycin and silver alginate Right dorsal foot about the same as the left using clindamycin and silver alginate The erythema in the right leg has resolved. He is DVT rule out was negative Left heel pressure area required debridement although the wound is smaller and the surface is health 10/26/2018 The patient came back in for his nurse check today predominantly because of the drainage coming out of the left lateral leg with [name] recent reopening of his original wound on the left lateral  calf. He comes in today with [name] large amount of surrounding erythema around the wound extending from the calf into the ankle and even in the area on the dorsal foot. He is not systemically unwell. He is not febrile. Nevertheless this looks like cellulitis. We have been using silver alginate to the area. I changed him to [name] regular visit and I am going to prescribe him doxycycline. The rationale here is [name] long list of medication intolerances and [name] history of MRSA. I did not see anything that I thought would provide [name] valuable culture 10/30/2018 Follow-up from his appointment 4 days ago with really an extensive area of cellulitis in the left calf left lateral ankle and left dorsal foot. I put him on doxycycline. He has [name] long list of medication allergies which are true allergy reactions. 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 In any case he is [name] lot better today. The erythema especially in the anterior and lateral left calf is better. He still has left ankle erythema. He also  is complaining about increasing edema in the right leg we have only been using Kerlix Coban and he has been doing the wraps at home. Finally he has [name] spotty rash on the medial part of his upper left calf which looks like folliculitis or perhaps wrap occlusion type injury. Small superficial macules not pustules 11/06/18 patient arrives today with again [name] considerable degree of erythema around the wound on the left lateral calf extending into the dorsal ankle and dorsal foot. This is [name] lot worse than when I saw this last week. He is on doxycycline really with not [name] lot of improvement. He has not been systemically unwell Wounds on the; left heel actually looks improved. Original area on the left foot and proximity to the first and second toes looks about the same. He has superficial areas on the dorsal foot, anterior calf and then the reopening of his original wound  on the left lateral calf which looks about the same The only area he has on the right is the dorsal webspace first and second which is smaller. He has [name] large area of dry erythematous skin on the left buttock small open area here. 11/13/2018; the patient arrives in much better condition. The erythema around the wound on the left lateral calf is [name] lot better. Not sure whether this was the clindamycin or the TCA and ketoconazole or just in the improvement in edema control [stasis dermatitis]. In any case this is [name] lot better. The area on the left heel is very small and just about resolved using silver collagen we have been using silver alginate to the areas on his dorsal feet 11/20/2018; his wounds include the left lateral calf, left heel, dorsal aspects of both feet just proximal to the first second webspace. He is stable to slightly improved. I did not think any changes to his dressings were going to be necessary 11/27/2018 he has [name] reopening on the left buttock which is surrounded by what looks like tinea or perhaps some other form of dermatitis. The area on the left dorsal foot has some erythema around it I have marked this area but I am not sure whether this is cellulitis or not. Left heel is not closed. Left calf the reopening is really slightly longer and probably worse 1/13; in general things look better and smaller except for the left dorsal foot. Area on the left heel is just about closed, left buttock looks better only [name] small wound remains in the skin looks better [using Lotrisone] 1/20; the area on the left heel only has [name] few remaining open areas here. Left lateral calf about the same in terms of size, left dorsal foot slightly larger right lateral foot still not closed. The area on the left buttock has no open wound and the surrounding skin looks [name] lot better 1/27; the area on the left heel is closed. Left lateral calf better but still requiring extensive debridements. The area on his left  buttock is closed. He still has the open areas on the left dorsal foot which is slightly smaller in the right foot which is slightly expanded. We have been using Iodoflex on these areas as well 2/3; left heel is closed. Left lateral calf still requiring debridement using Iodoflex there is no open area on his left buttock however he has dry scaly skin over [name] large area of this. Not really responding well to the Lotrisone. Finally the areas on his dorsal feet at the level of the first second  webspace are slightly smaller on the right and about the same on the left. Both of these vigorously debrided with Anasept and gauze 2/10; left heel remains closed he has dry erythematous skin over the left buttock but there is no open wound here. Left lateral leg has come in and with. Still requiring debridement we have been using Iodoflex here. Finally the area on the left dorsal foot and right dorsal foot are really about the same extremely dry callused fissured areas. He does not yet have [name] dermatology appointment 2/17; left heel remains closed. He has [name] new open area on the left buttock. The area on the left lateral calf is bigger longer and still covered in necrotic debris. No major change in his foot areas bilaterally. I am awaiting for [name] dermatologist to look on this. We have been using ketoconazole I do not know that this is been doing any good at all. 2/24; left heel remains closed. The left buttock wound that was new reopening last week looks better. The left lateral calf appears better also although still requires debridement. The major area on his foot is the left first second also requiring debridement. We have been putting Prisma on all wounds. I do not believe that the ketoconazole has done too much good for his feet. He will use Lotrisone I am going to give him [name] 2-week course of terbinafine. We still do not have [name] dermatology appointment 3/2 left heel remains closed however there is skin over bone  in this area I pointed this out to him today. The left buttock wound is epithelialized but still does not look completely stable. The area on the left leg required debridement were using silver collagen here. With regards to his feet we changed to Lotrisone last week and silver alginate. 3/9; left heel remains closed. Left buttock remains closed. The area on the right foot is essentially closed. The left foot remains unchanged. Slightly smaller on the left lateral calf. Using silver collagen to both of these areas 3/16-Left heel remains closed. Area on right foot is closed. Left lateral calf above the lateral malleolus open wound requiring debridement with easy bleeding. Left dorsal wound proximal to first toe also debrided. Left ischial area open new. Patient has been using Prisma with wrapping every 3 days. Dermatology appointment is apparently tomorrow.Patient has completed his terbinafine 2-week course with some apparent improvement according to him, there is still flaking and dry skin in his foot on the left 3/23; area on the right foot is reopened. The area on the left anterior foot is about the same still [name] very necrotic adherent surface. He still has the area on the left leg and reopening is on the left buttock. He apparently saw dermatology although I do not have [name] note. According to the patient who is usually fairly well informed they did not have any good ideas. Put him on oral terbinafine which she is been on before. Cortez, THOREN KOBY ([id number]) [id number] Page 9 of 35 3/30; using silver collagen to all wounds. Apparently his dermatologist put him on doxycycline and rifampin presumably some culture grew staph. I do not have this result. He remains on terbinafine although I have used terbinafine on him before 4/6; patient has had [name] fairly substantial reopening on the right foot between the first and second toes. He is finished his terbinafine and I believe is  on doxycycline and rifampin still as prescribed by dermatology. We have been using silver collagen to all his wounds  although the patient reports that he thinks silver alginate does better on the wounds on his buttock. 4/13; the area on his left lateral calf about the same size but it did not require debridement. Left dorsal foot just proximal to the webspace between the first and second toes is about the same. Still nonviable surface. I note some superficial bronze discoloration of the dorsal part of his foot Right dorsal foot just proximal to the first and second toes also looks about the same. I still think there may be the same discoloration I noted above on the left Left buttock wound looks about the same 4/20; left lateral calf appears to be gradually contracting using silver collagen. 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 The left dorsal foot wound is debrided of tightly adherent necrotic debris and really cleans up quite nicely. The right area is worse with expansion. I did not debride this it is now over the base of the second toe The area on his left buttock is smaller no debridement is required using silver collagen 5/4; left calf continues to make good progress. He arrives with erythema around the wounds on his dorsal foot which even extends to the plantar aspect. Very concerning for coexistent infection. He is finished the erythromycin I gave him for possible erythrasma this does not seem to have helped. The area on the left foot is about the same base of the dorsal toes Is area on the buttock looks improved on the left 5/11; left calf and left buttock continued to make good progress. Left foot is about the same to slightly improved. Major problem is on the right foot. He has not had an x-ray. Deep tissue culture I did last week showed both Enterobacter and [name]. coli. I did not change the doxycycline I put him on empirically  although neither 1 of these were plated to doxycycline. He arrives today with the erythema looking worse on both the dorsal and plantar foot. Macerated skin on the bottom of the foot. he has not been systemically unwell 5/18-Patient returns at 1 week, left calf wound appears to be making some progress, left buttock wound appears slightly worse than last time, left foot wound looks slightly better, right foot redness is marginally better. X-ray of both feet show no air or evidence of osteomyelitis. Patient is finished his Omnicef and terbinafine. He continues to have macerated skin on the bottom of the left foot as well as right 5/26; left calf wound is better, left buttock wound appears to have multiple small superficial open areas with surrounding macerated skin. X-rays that I did last time showed no evidence of osteomyelitis in either foot. He is finished cefdinir and doxycycline. I do not think that he was on terbinafine. He continues to have [name] large superficial open area on the right foot anterior dorsal and slightly between the first and second toes. I did send him to dermatology 2 months ago or so wondering about whether they would do [name] fungal scraping. I do not believe they did but did do [name] culture. We have been using silver alginate to the toe areas, he has been using antifungals at home topically either ketoconazole or Lotrisone. We are using silver collagen on the left foot, silver alginate on the right, silver collagen on the left lateral leg and silver alginate on the left buttock 6/1; left buttock area is healed. We have the left dorsal foot, left lateral leg and right dorsal foot.  We are using silver alginate to the areas on both feet and silver collagen to the area on his left lateral calf 6/8; the left buttock apparently reopened late last week. He is not really sure how this happened. He is tolerating the terbinafine. Using silver alginate to all wounds 6/15; left buttock wound is  larger than last week but still superficial. Came in the clinic today with [name] report of purulence from the left lateral leg I did not identify any infection Both areas on his dorsal feet appear to be better. He is tolerating the terbinafine. Using silver alginate to all wounds 6/22; left buttock is about the same this week, left calf quite [name] bit better. His left foot is about the same however he comes in with erythema and warmth in the right forefoot once again. Culture that I gave him in the beginning of May showed Enterobacter and [name]. coli. I gave him doxycycline and things seem to improve although neither 1 of these organisms was specifically plated. 6/29; left buttock is larger and dry this week. Left lateral calf looks to me to be improved. Left dorsal foot also somewhat improved right foot completely unchanged. The erythema on the right foot is still present. He is completing the Ceftin dinner that I gave him empirically [see discussion above.) 7/6 - All wounds look to be stable and perhaps improved, the left buttock wound is slightly smaller, per patient bleeds easily, completed ceftin, the right foot redness is less, he is on terbinafine 7/13; left buttock wound about the same perhaps slightly narrower. Area on the left lateral leg continues to narrow. Left dorsal foot slightly smaller right foot about the same. We are using silver alginate on the right foot and Hydrofera Blue to the areas on the left. Unna boot on the left 2 layer compression on the right 7/20; left buttock wound absolutely the same. Area on lateral leg continues to get better. Left dorsal foot require debridement as did the right no major change in the 7/27; left buttock wound the same size necrotic debris over the surface. The area on the lateral leg is closed once again. His left foot looks better right foot about the same although there is some involvement now of the posterior first second toe area. He is still on  terbinafine which I have given him for [name] month, not certain [name] centimeter major change 06/25/19-All wounds appear to be slightly improved according to report, left buttock wound looks clean, both foot wounds have minimal to no debris the right dorsal foot has minimal slough. We are using Hydrofera Blue to the left and silver alginate to the right foot and ischial wound. 8/10-Wounds all appear to be around the same, the right forefoot distal part has some redness which was not there before, however the wound looks clean and small. Ischial wound looks about the same with no changes 8/17; his wound on the left lateral calf which was his original chronic venous insufficiency wound remains closed. Since I last saw him the areas on the left dorsal foot right dorsal foot generally appear better but require debridement. The area on his left initial tuberosity appears somewhat larger to me perhaps hyper granulated and bleeds very easily. We have been using Hydrofera Blue to the left dorsal foot and silver alginate to everything else 8/24; left lateral calf remains closed. The areas on his dorsal feet on the webspace of the first and second toes bilaterally both look better. The area on the  left buttock which is the pressure ulcer stage II slightly smaller. I change the dressing to Hydrofera Blue to all areas 8/31; left lateral calf remains closed. The area on his dorsal feet bilaterally look better. Using Hydrofera Blue. Still requiring debridement on the left foot. No change in the left buttock pressure ulcers however 9/14; left lateral calf remains closed. Dorsal feet look quite [name] bit better than 2 weeks ago. Flaking dry skin also [name] lot better with the ammonium lactate I gave him 2 weeks ago. The area on the left buttock is improved. He states that his Roho cushion developed [name] leak and he is getting [name] new one, in the interim he is offloading this vigorously 9/21; left calf remains closed. Left heel which was [name]  possible DTI looks better this week. He had macerated tissue around the left dorsal foot right foot looks satisfactory and improved left buttock wound. I changed his dressings to his feet to silver alginate bilaterally. Continuing Hydrofera Blue on the left buttock. 9/28 left calf remains closed. Left heel did not develop anything [possible DTI] dry flaking skin on the left dorsal foot. Right foot looks satisfactory. Improved left buttock wound. We are using silver alginate on his feet Hydrofera Blue on the buttock. I have asked him to go back to the Lotrisone on his feet including the wounds and surrounding areas 10/5; left calf remains closed. The areas on the left and right feet about the same. [name] lot of this is epithelialized however debris over the remaining open areas. He is using Lotrisone and silver alginate. The area on the left buttock using Hydrofera Blue 10/26. Patient has been out for 3 weeks secondary to Covid concerns. He tested negative but I think his wife tested positive. He comes in today with the left foot substantially worse, right foot about the same. Even more concerning he states that the area on his left buttock closed over but then reopened and is considerably deeper in one aspect than it was before [stage III wound] 11/2; left foot really about the same as last week. Quarter sized wound on the dorsal foot just proximal to the first second toes. Surrounding erythema with areas of denuded epithelium. This is not really much different looking. Did not look like cellulitis this time however. Right foot area about the same.. We have been using silver alginate alginate on his toes Left buttock still substantial irritated skin around the wound which I think looks somewhat better. We have been using Hydrofera Blue here. 11/9; left foot larger than last week and [name] very necrotic surface. Right foot I think is about the same perhaps slightly smaller. Debris around the  circumference also addressed. Unfortunately on the left buttock there is been [name] decline. Satellite lesions below the major wound distally and now [name] an additional one posteriorly we have been using Hydrofera Blue but I think this is [name] pressure issue 11/16; left foot ulcer dorsally again [name] very adherent necrotic surface. Right foot is about the same. Not much change in the pressure ulcer on his left buttock. 11/30; left foot ulcer dorsally basically the same as when I saw him 2 weeks ago. Very adherent fibrinous debris on the wound surface. Patient reports [name] lot of [place], Davine [name] ([id number]) [id number] Page 10 of 35 drainage as well. 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 We have been using Iodoflex, patient changed back to alginate because of the drainage. Area on his  right dorsal foot really looks benign with [name] healthier surface certainly [name] lot better than on the left. Left buttock wounds all improved using Hydrofera Blue 12/7; left dorsal foot again no improvement. Tightly adherent debris. PCR culture I did last week only showed likely skin contaminant. I have gone ahead and done [name] punch biopsy of this which is about the last thing in terms of investigations I can think to do. He has known venous insufficiency and venous hypertension and this could be the issue here. The area on the right foot is about the same left buttock slightly worse according to our intake nurse secondary to [facility] Blue sticking to the wound 12/14; biopsy of the left foot that I did last time showed changes that could be related to wound healing/chronic stasis dermatitis phenomenon no neoplasm. We have been using silver alginate to both feet. I change the one on the left today to Sorbact and silver alginate to his other 2 wounds 12/28; the patient arrives with the following problems; Major issue is the dorsal left foot which continues to be [name]  larger deeper wound area. Still with [name] completely nonviable surface Paradoxically the area mirror image on the right on the right dorsal foot appears to be getting better. He had some loss of dry denuded skin from the lower part of his original wound on the left lateral calf. Some of this area looked [name] little vulnerable and for this reason we put him in wrap that on this side this week The area on his left buttock is larger. He still has the erythematous circular area which I think is [name] combination of pressure, sweat. This does not look like cellulitis or fungal dermatitis 11/26/2019; -Dorsal left foot large open wound with depth. Still debris over the surface. Using Sorbact The area on the dorsal right foot paradoxically has closed over He has [name] reopening on the left ankle laterally at the base of his original wound that extended up into the calf. This appears clean. The left buttock wound is smaller but with very adherent necrotic debris over the surface. We have been using silver alginate here as well The patient had arterial studies done in 2017. He had biphasic waveforms at the dorsalis pedis and posterior tibial bilaterally. ABI in the left was 1.17. Digit waveforms were dampened. He has slight spasticity in the great toes I do not think [name] TBI would be possible 1/11; the patient comes in today with [name] sizable reopening between the first and second toes on the right. This is not exactly in the same location where we have been treating wounds previously. According to our intake nurse this was actually fairly deep but 0.6 cm. The area on the left dorsal foot looks about the same the surface is somewhat cleaner using Sorbact, his MRI is in 2 days. We have not managed yet to get arterial studies. The new reopening on the left lateral calf looks somewhat better using alginate. The left buttock wound is about the same using alginate 1/18; the patient had his ARTERIAL studies which were quite normal. ABI  in the right at 1.13 with triphasic/biphasic waveforms on the left ABI 1.06 again with triphasic/biphasic waveforms. It would not have been possible to have done [name] toe brachial index because of spasticity. We have been using Sorbac to the left foot alginate to the rest of his wounds on the right foot left lateral calf and left buttock 1/25; arrives in clinic with erythema and swelling of  the left forefoot worse over the first MTP area. This extends laterally dorsally and but also posteriorly. Still has an area on the left lateral part of the lower part of his calf wound it is eschared and clearly not closed. Area on the left buttock still with surrounding irritation and erythema. Right foot surface wound dorsally. The area between the right and first and second toes appears better. 2/1; The left foot wound is about the same. Erythema slightly better I gave him [name] week of doxycycline empirically Right foot wound is more extensive extending between the toes to the plantar surface Left lateral calf really no open surface on the inferior part of his original wound however the entire area still looks vulnerable Absolutely no improvement in the left buttock wound required debridement. 2/8; the left foot is about the same. Erythema is slightly improved I gave him clindamycin last week. Right foot looks better he is using Lotrimin and silver alginate He has [name] breakdown in the left lateral calf. Denuded epithelium which I have removed Left buttock about the same were using Hydrofera Blue 2/15; left foot is about the same there is less surrounding erythema. Surface still has tightly adherent debris which I have debriding however not making any progress Right foot has [name] substantial wound on the medial right second toe between the first and second webspace. Still an open area on the left lateral calf distal area. Buttock wound is about the same 2/22; left foot is about the same less surrounding erythema.  Surface has adherent debris. Polymen Ag Right foot area significant wound between the first and second toes. We have been using silver alginate here Left lateral leg polymen Ag at the base of his original venous insufficiency wound Left buttock some improvement here 3/1; Right foot is deteriorating in the first second toe webspace. Larger and more substantial. We have been using silver alginate. Left dorsal foot about the same markedly adherent surface debris using PolyMem Ag Left lateral calf surface debris using PolyMem AG Left buttock is improved again using PolyMem Ag. He is completing his terbinafine. The erythema in the foot seems better. He has been on this for 2 weeks 3/8; no improvement in any wound area in fact he has [name] small open area on the dorsal midfoot which is new this week. He has not gotten his foot x-rays yet 3/15; his x-rays were both negative for osteomyelitis of both feet. No major change in any of his wounds on the extremities however his buttock wounds are better. We have been using polymen on the buttocks, left lower leg. Iodoflex on the left foot and silver alginate on the right 3/22; arrives in clinic today with the 2 major issues are the improvement in the left dorsal foot wound which for once actually looks healthy with [name] nice healthy wound surface without debridement. Using Iodoflex here. Unfortunately on the left lateral calf which is in the distal part of his original wound he came to the clinic here for there was purulent drainage noted some increased breakdown scattered around the original area and [name] small area proximally. We we are using polymen here will change to silver alginate today. His buttock wound on the left is better and I think the area on the right first second toe webspace is also improved 3/29; left dorsal foot looks better. Using Iodoflex. Left ankle culture from deterioration last time grew [name]. coli, Enterobacter and Enterococcus. I will give him  [name] course of cefdinir although that  will not cover Enterococcus. The area on the right foot in the webspace of the first and second toe lateral first toe looks better. The area on his buttock is about healed Vascular appointment is on April 21. This is to look at his venous system vis--vis continued breakdown of the wounds on the left including the left lateral leg and left dorsal foot he. He has had previous ablations on this side 4/5; the area between the right first and second toes lateral aspect of the first toe looks better. Dorsal aspect of the left first toe on the left foot also improved. Unfortunately the left lateral lower leg is larger and there is [name] second satellite wound superiorly. The usual superficial abrasions on the left buttock overall better but certainly not closed 4/12; the area between the right first and second toes is improved. Dorsal aspect of the left foot also slightly smaller with [name] vibrant healthy looking surface. No real change in the left lateral leg and the left buttock wound is healed He has an unaffordable co-pay for Apligraf. Appointment with vein and vascular with regards to the left leg venous part of the circulation is on 4/21 4/19; we continue to see improvement in all wound areas. Although this is minor. He has his vascular appointment on 4/21. The area on the left buttock has not reopened although right in the center of this area the skin looks somewhat threatened 4/26; the left buttock is unfortunately reopened. In general his left dorsal foot has [name] healthy surface and looks somewhat smaller although it was not measured as such. The area between his first and second toe webspace on the right as [name] small wound against the first toe. The patient saw vascular surgery. The real question I was asking was about the small saphenous vein on the left. He has previously ablated left greater saphenous vein. Nothing further was commented on on the left. Right greater  saphenous vein without reflux at the saphenofemoral junction or proximal thigh there was no indication for ablation of the right greater saphenous vein duplex was negative for DVT bilaterally. They did not think there was anything from [name] Souder, [name] ([id number]) [id number] Page 11 of 35 vascular surgery point of view that could be offered. They ABIs within normal limits 5/3; only small open area on the left buttock. The area on the left lateral leg which was his original venous reflux is now 2 wounds both which look clean. We are using Iodoflex on the left dorsal foot which looks healthy and smaller. He is down to [name] very tiny area between the right first and second toes, using silver alginate 5/10; all of his wounds appear better. We have much better edema control in 4 layer compression on the left. This may be the factor that is allowing the left foot and left lateral calf to heal. He has external compression garments at home 04/14/20-All of his wounds are progressing well, the left forefoot is practically closed, left ischium appears to be about the same, right toe webspace is also smaller. The left lateral leg is about the same, continue using Hydrofera Blue to this, silver alginate to the ischium, Iodoflex to the toe space on the right 6/7; most of his wounds outside of the left buttock are doing well. The area on the left lateral calf and left dorsal foot are smaller. The area on the right foot in between the first and second toe webspace is barely visible although he still says  there is some drainage here is the only reason I did not heal this out. Unfortunately the area on the left buttock almost looks like he has [name] skin tear from tape. He has open wound and then [name] large flap of skin that we are trying to get adherence over an area just next to the remaining wound 6/21; 2 week follow-up. I believe is been here for nurse visits. Miraculously the area between his  first and second toes on the left dorsal foot is closed over. Still open on the right first second web space. The left lateral calf has 2 open areas. Distally this is more superficial. The proximal area had [name] little more depth and required debridement of adherent necrotic material. His buttock wound is actually larger we have been using silver alginate here 6/28; the patient's area on the left foot remains closed. Still open wet area between the first and second toes on the right and also extending into the plantar aspect. We have been using silver alginate in this location. He has 2 areas on the left lower leg part of his original long wounds which I think are better. We have been using Hydrofera Blue here. Hydrofera Blue to the left buttock which is stable 7/12; left foot remains closed. Left ankle is closed. May be [name] small area between his right first and second toes the only truly open area is on the left buttock. We have been using Hydrofera Blue here 7/19; patient arrives with marked deterioration especially in the left foot and ankle. We did not put him in [name] compression wrap on the left last week in fact he wore his juxta lite stockings on either side although he does not have an underlying stocking. He has [name] reopening on the left dorsal foot, left lateral ankle and [name] new area on the right dorsal ankle. More worrisome is the degree of erythema on the left foot extending on the lateral foot into the lateral lower leg on the left 7/26; the patient had erythema and drainage from the lateral left ankle last week. Culture of this grew MRSA resistant to doxycycline and clindamycin which are the 2 antibiotics we usually use with this patient who has multiple antibiotic allergies including linezolid, trimethoprim sulfamethoxazole. I had give him an empiric doxycycline and he comes in the area certainly looks somewhat better although it is blotchy in his lower leg. He has not been systemically unwell. He  has had areas on the left dorsal foot which is [name] reopening, chronic wounds on the left lateral ankle. Both of these I think are secondary to chronic venous insufficiency. The area between his first and second toes is closed as far as I can tell. He had [name] new wrap injury on the right dorsal ankle last week. Finally he has an area on the left buttock. We have been using silver alginate to everything except the left buttock we are using Hydrofera Blue 06/30/20-Patient returns at 1 week, has been given [name] sample dose pack of NUZYRA which is [name] tetracycline derivative [omadacycline], patient has completed those, we have been using silver alginate to almost all the wounds except the left ischium where we are using Hydrofera Blue all of them look better 8/16; since I last saw the patient he has been doing well. The area on the left buttock, left lateral ankle and left foot are all closed today. He has completed the [place] I gave him last time and tolerated this well. He still has  open areas on the right dorsal ankle and in the right first second toe area which we are using silver alginate. 8/23; we put him in his bilateral external compression stockings last week as he did not have anything open on either leg except for concerning area between the right first and second toe. He comes in today with an area on the left dorsal foot slightly more proximal than the original wound, the left lateral foot but this is actually [name] continuation of the area he had on the left lateral ankle from last time. As well he is opened up on the left buttock again. 8/30; comes in today with things looking [name] lot better. The area on the left lower ankle has closed down as has the left foot but with eschar in both areas. The area on the dorsal right ankle is also epithelialized. Very little remaining of the left buttock wound. We have been using silver alginate on all wound areas 9/13; the area in the first second toe webspace on the right  has fully epithelialized. He still has some vulnerable epithelium on the right and the ankle and the dorsal foot. He notes weeping. He is using his juxta lite stocking. On the left again the left dorsal foot is closed left lateral ankle is closed. We went to the juxta lite stocking here as well. Still vulnerable in the left buttock although only 2 small open areas remain here 9/27; 2-week follow-up. We did not look at his left leg but the patient says everything is closed. He is [name] bit disturbed by the amount of edema in his left foot he is using juxta lite stockings but asking about over the toes stockings which would be 30/40, will talk to him next time. According to him there is no open wound on either the left foot or the left ankle/calf He has an open area on the dorsal right calf which I initially point [name] wrap injury. He has superficial remaining wound on the left ischial tuberosity been using silver alginate although he says this sticks to the wound 10/5; we gave him 2-week follow-up but he called yesterday expressing some concerns about his right foot right ankle and the left buttock. He came in early. There is still no open areas on the left leg and that still in his juxta lite stocking 10/11; he only has 1 small area on the left buttock that remains measuring millimeters 1 mm. Still has the same irritated skin in this area. We recommended zinc oxide when this eventually closes and pressure relief is meticulously is he can do this. He still has an area on the dorsal part of his right first through third toes which is [name] bit irritated and still open and on the dorsal ankle near the crease of the ankle. We have been using silver alginate and using his own stocking. He has nothing open on the left leg or foot 10/25; 2-week follow-up. Not nearly as good on the left buttock as I was hoping. For open areas with 5 looking threatened small. He has the erythematous irritated chronic skin in this  area. 1 area on the right dorsal ankle. He reports this area bleeds easily Right dorsal foot just proximal to the base of his toes We have been using silver alginate. 11/8; 2-week follow-up. Left buttock is about the same although I do not think the wounds are in the same location we have been using silver alginate. I have asked him to use  zinc oxide on the skin around the wounds. He still has [name] small area on the right dorsal ankle he reports this bleeds easily Right dorsal foot just proximal to the base of the toes does not have anything open although the skin is very dry and scaly He has [name] new opening on the nailbed of the left great toe. Nothing on the left ankle 11/29; 3-week follow-up. Left buttock has 2 open areas. And washing of these wounds today started bleeding easily. Suggesting very friable tissue. We have been using silver alginate. Right dorsal ankle which I thought was initially [name] wrap injury we have been using silver alginate. Nothing open between the toes that I can see. He states the area on the left dorsal toe nailbed healed after the last visit in 2 or 3 days 12/13; 3-week follow-up. His left buttock now has 3 open areas but the original 2 areas are smaller using polymen here. Surrounding skin looks better. The right dorsal ankle is closed. He has [name] small opening on the right dorsal foot at the level of the third toe. In general the skin looks better here. He is wearing his juxta lite stocking on the left leg says there is nothing open 11/24/2020; 3 weeks follow-up. His left buttock still has the 3 open areas. We have been using polymen but due to lack of response he changed to [facility] area. Surrounding skin is dry erythematous and irritated looking. There is no evidence of infection either bacterial or fungal however there is loss of surface epithelium He still has very dry skin in his foot causing irritation and erythema on the dorsal part of his toes. This is not  responded to prolonged courses of antifungal simply looks dry and irritated 1/24; left buttock area still looks about the same he was unable to find the triad ointment that we had suggested. The area on the right lower leg just above the dorsal ankle has reopened and the areas on the right foot between the first second and second third toes and scaling on the bottom of the foot has been about the same for quite some time now. been using silver alginate to all wound areas 2/7; left buttock wound looked quite good although not much smaller in terms of surface area surrounding skin looks better. Only [name] few dry flaking areas on the right foot in between the first and second toes the skin generally looks better here [ammonium lactate]. Finally the area on the right dorsal ankle is closed 2/21; There is no open area on the right foot even between the right first and second toe. Skin around this area dorsally and plantar aspects look better. He has [name] reopening of the area on the right ankle just above the crease of the ankle dorsally. I continue to think that this is probably friction from spasms may be even this time with his stocking under the compression stockings. Wounds on his left buttock look about the same there [name] couple of areas that have reopened. He has [name] total square area of loss of epithelialization. This does not look like infection it looks like [name] contact dermatitis but I just cannot determine to what 3/14; there is nothing on the right foot between the first and second toes this was carefully inspected under illumination. Some chronic irritation on the dorsal part of his foot from toes 1-3 at the base. Nothing really open here substantially. Still has an area on the right foot/ankle that is actually  larger and hyper Rondon, Tawfiq [name] ([id number]) [id number] Page 12 of 35 granulated. His buttock area on the left is just about closed however he has chronic inflammation  with loss of the surface epithelial layer 3/28; 2-week follow-up. In clinic today with [name] new wound on the left anterior mid tibia. Says this happened about 2 weeks ago. He is not really sure how wonders about the spasticity of his legs at night whether that could have caused this other than that he does not have [name] good idea. He has been using topical antibiotics and silver alginate. The area on his right dorsal ankle seems somewhat better. Finally everything on his left buttock is closed. 4/11; 2-week follow-up. All of his wounds are better except for the area over the ischium and left buttock which have opened up widely again. At least part of this is covered in necrotic fibrinous material another part had rolled nonviable skin. The area on the right ankle, left anterior mid tibia are both [name] lot better. He had no open wounds on either foot including the areas between the first and second toes 4/25; patient presents for 2-week follow-up. He states that the wounds are overall stable. He has no complaints today and states he is using Hydrofera Blue to open wounds. 5/9; have not seen this man in over [name] month. For my memory he has open areas on the left mid tibia and right ankle. T oday he has new open area on the right dorsal foot which we have not had [name] problem with recently. He has the sustained area on the left buttock He is also changed his insurance at the beginning of the year [employer]. We will need prior authorizations for debridement 5/23; patient presents for 2-week follow-up. He has prior authorizations for debridement. He denies any issues in the past 2 weeks with his wound care. He has been using Hydrofera Blue to all the wounds. He does report [name] circular rash to the upper left leg that is new. He denies acute signs of infection. 6/6; 2-week follow-up. The patient has open wounds on the left buttock which are worse than the last time I saw this about [name] month ago. He also has [name] new  area to me on the left anterior mid tibia with some surrounding erythema. The area on the dorsal ankle on the right is closed but I think this will be [name] friction injury every time this area is exposed to either our wraps or his compression stockings caused by unrelenting spasms in this leg. 6/20; 2-week follow-up. The patient has open wounds on the left buttock which is about the same. Using [facility] here. - The left mid tibia has [name] static amount of surrounding erythema. Also [name] raised area in the center. We have been using Hydrofera Blue here. Finally he has broken down in his dorsal right foot extending between the first and second toes and going to the base of the first and second toe webspace. I have previously assumed that this was severe venous hypertension 7/5; 2-week follow-up The left buttock wound actually looks better. We are using Hydrofera Blue. He has extensive skin irritation around this area and I have not really been able to get that any better. I have tried Lotrisone i.[name]. antifungals and steroids. More most recently we have just been using Coloplast really looks about the same. The left mid tibia which was new last week culture to have very resistant staph aureus. Not only methicillin-resistant but  doxycycline resistant. The patient has [name] plethora of antibiotic allergies including sulfa, linezolid. I used topical bacitracin on this but he has not started this yet. In addition he has an expanding area of erythema with [name] wound on the dorsal right foot. I did [name] deep tissue culture of this area today 7/12; Left buttock area actually looks better surrounding skin also looks less irritated. Left mid tibia looks about the same. He is using bacitracin this is not worse Right dorsal foot looks about the same as well. The left first toe also looks about the same 7/19; left buttock wound continues to improve in terms of open areas Left mid tibia is still concerning amount of swelling  he is using bacitracin Dorsal left first toe somewhat smaller Right dorsal foot somewhat smaller 7/25; left buttock wound actually continues to improve Left mid tibia area has less swelling. I gave him all my samples of new Nuzyra. This seems to have helped although the wound is still open it. His abrasion closed by here Left dorsal great toe really no better. Still [name] very nonviable surface Right dorsal foot perhaps some better. We have been using bacitracin and silver nitrate to the areas on his lower legs and Hydrofera Blue to the area on the buttock. 8/16 Disappointed that his left buttock wound is actually more substantial. Apparently during the last nurse visit these were both very small. He has continued irritation to [name] large area of skin on his buttock. I have never been able to totally explain this although I think it some combination of the way he sits, pressure, moisture. He is not incontinent enough to contribute to this. Left dorsal great toe still fibrinous debris on the surface that I have debrided today Large area across the dorsal right toes. The area on the left anterior mid tibia has less swelling. He completed the [place]. This does not look infected although the tissue is still fried 8/30; 2-week follow-up. Left buttock areas not improved. We used Hydrofera Blue on this. Weeping wet with the surrounding erythema that I have not been able to control even with Lotrisone and topical Coloplast Left dorsal great toe looks about the same More substantial area again at the base of his toes on the left which is new this week. Area across the dorsal right toes looks improved The left anterior mid tibia looks like it is trying to close 9/13; 2-week follow-up. Using silver alginate on all of his wounds. The left dorsal foot does not look any better. He has the area on the dorsal toe and also the areas at the base of all of his toes 1 through 3. On the right foot he has [name] similar  pattern in [name] similar area. He has the area on his left mid tibia that looks fairly healthy. Finally the area on his left buttock looks somewhat bette 9/20; culture I did of the left foot which was [name] deep tissue culture last time showed [name]. coli he has erythema around this wound. Still [name] completely necrotic surface. His right dorsal foot looks about the same. He has [name] very friable surface to the left anterior mid tibia. Both buttock wounds look better. We have been using silver alginate to all wounds 10/4; he has completed the cephalexin that I gave him last time for the left foot. He is using topical gentamicin under silver alginate silver alginate being applied to all the wounds. Unfortunately all the wounds look irritated on his dorsal right foot  dorsal left foot left mid tibia. I wonder if this could be [name] silver allergy. I am going to change him to [facility] on the lower extremity. The skin on the left buttock and left posterior thigh still flaking dry and irritated. This has continued no matter what I have applied topically to this. He has [name] solitary open wound which by itself does not look too bad however the entire area of surrounding skin does not change no matter what we have applied here 10/18; the area on the left dorsal foot and right dorsal foot both look better. The area on the right extends into the plantar but not between his toes. We have been using silver alginate. He still has [name] rectangular erythematous area around the area on the left tibia. The wound itself is very small. Finally everything on his left buttock looks [name] little larger the skin is erythematous 11/15; patient comes in with the left dorsal and right dorsal foot distally looking somewhat better. Still nonviable surface on the left foot which required debridement. He still has the area on the left anterior mid tibia although this looks somewhat better. He has [name] new area on the right lateral lower leg just above the  ankle. Finally his left buttock looks terrible with multiple superficial open wounds geometric square shaped area of chronic erythema which I have not been able to sort through 11/29; right dorsal foot and left dorsal foot both look somewhat better. No debridement required. He has the fragile area on the left anterior mid tibia this looks and continues to look somewhat better. Right lateral lower leg just above the ankle we identified last time also looks better. In general the area on his left buttock looks improved. We are using Hydrofera Blue to all wound areas 12/13; right dorsal foot looks better. The area on the right lateral leg is healed. Left dorsal foot has 2 open areas both of which require debridement. The fragile area on the left anterior mid tibial looks better. Smaller area on his buttocks. Were using Hydrofera Blue 1/10; patient comes in with everything looking slightly larger and/or worse. This includes his left buttock, reopening of the left mid tibia, larger areas on the left dorsal foot and what looks to be [name] cellulitis on the right dorsal and plantar foot. We have been using Hydrofera Blue on all wounds. 1/17; right dorsal foot distally looks better today. The left foot has 2 open wounds that are about the same surrounding erythema. Culture I did last week showed rare Enterococcus and [name] multidrug resistant MRSA. 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 No malignancy they did not stain for fungus 1/24; his right distal foot is not closed dry and scaly but the wound looks like it is contracting. I did not debride anything here. Problem on his left dorsal foot with expanding erythema. Apparently there were problems last week getting the Riki Altes however it is now available at the [employer] [facility] but [name] week later. He is using ketoconazole and  Coloplast to the left buttock along with Hydrofera Blue this actually looks quite [name] bit better today. 1/31; right dorsal foot again is dry and scaly but looking to contract. He has been using [name] moisturizer on his feet at my request but he is not sure which 1. The left dorsal foot wounds look about the same there is erythema here that I marked last  week however after course of Riki Altes it certainly is not any better but not any worse either. Finally on his left buttock the skin continues to look better he has the original wound but [name] new substantial area towards the gluteal cleft. Almost like [name] skin tear. I used scissors to remove skin and subcutaneous tissue here silver nitrate and direct pressure 2/7; right dorsal foot. This does not look too much different from last week. Some erythema skin dry and scaly. No debridement. Left dorsal great toe again still not much improvement. I did remove flaking dry skin and callus from around the edge. Finally on his left buttock. The skin is somewhat better in the periwound. Surface wounds are superficial somewhat better than last week. 01/26/2022: Is [name] little bit of [name] mystery as to why his wounds fail to respond to treatments and actually seem to get worse. This is my first encounter with this patient. He was previously followed by Dr. Leanord Hawking. Based upon my review of the chart, it seems that there is [name] little bit of [name] mystery as to why his wounds do not respond as anticipated to the interventions applied and sometimes even get worse. Biopsies have been performed and he was seen by dermatology in [place], but that did not shed any light on the matter. T oday, his gluteal wound is larger, with substantial drainage, rather malodorous. The food wounds are not terrible, but he has [name] lot of callus and scaly skin around these. He is currently getting silver alginate on the gluteal wound, with idodoflex to the feet. He is using lotrisone on his legs for the dry, scaly  skin. 02/09/2022: There has really been no change to any of his wounds. The gluteal wound less drainage and odor, but remains about the same size, the periwound skin remains oddly scaly. His lower extremity wounds also appear roughly the same size. They continue to accumulate [name] small amount of slough. The periwound on his feet and ankle wounds has dry eschar and loose dead skin. We have been using silver alginate on the gluteal wound and Iodoflex on his feet and ankle wounds. T the periwound around his gluteal lesion and Lotrisone on his feet and legs. o 02/23/2022: The right plantar foot wound is closed. The gluteal site looks small but has continued to produce hypertrophic granulation tissue. The foot wounds all look about the same on the dorsal surface of the right foot; on the left, there is only [name] small open area at the site of where his left great toenail would have been. 03/16/2022: The right ankle wound is healed. The right dorsal foot wound is about the same. The left dorsal foot wound is quite [name] bit smaller and the ischial wound is nearly closed. 03/30/2022: The right ankle wound reopened. Both dorsal foot wounds are quite [name] bit smaller. Unfortunately, he appears to have sheared part of his ischial wound open further, perhaps during [name] transfer. 04/13/2022: The right ankle wound has hypertrophic granulation tissue present. The dorsal foot wounds continue to decrease in size. The ischial wound looks about the same today, no better, no worse. 04/27/2022: The right ankle wound has closed. Unfortunately, it looks like some moisture got underneath the dry skin on both of his dorsal feet and these wounds have expanded in size. The ischial wound remains the same with perhaps [name] little bit more slough accumulation than at our previous visit. 05/11/2022: The right ankle wound remains closed. There is [name] left anterior tibial wound  that is small has patchy openings with accumulated slough. The dorsum of his  right foot appears to be nearly healed with just [name] small punctate opening. The plantar surface of his right foot has [name] new opening that looks like he may have picked some skin there. His sacral ulcer has hypertrophic granulation tissue but has some slough accumulation. The dorsum of his left foot has multiple open areas in [name] fairly ragged distribution. All of these have slough accumulated within them. 06/01/2022: The right ankle and left anterior tibial wound are both closed. Dorsum of his right foot and left foot both look substantially better with just tiny scattered openings The without any slough accumulation. He has sheared open new areas on his left gluteus and ischium. He says that his wheelchair cushion, which is air-filled, has [name] leak and so it keeps deflating. He is awaiting [name] new cushion. 06/15/2022: The right ankle wound has reopened and the fat layer is exposed. Both dorsal feet have just small openings with just [name] little bit of slough and eschar accumulation. The wound on his left gluteus and ischium is larger again today and has [name] foul odor. 06/29/2022: The right ankle wound has hypertrophic granulation tissue buildup. His dorsal foot wounds both look better with just some eschar on the surface. He has [name] new wound on his left lateral ankle. He is not sure how he acquired it but by appearance, it looks that he hit it on something, potentially his wheelchair or bed. The ischial wound is about the same but is cleaner without any significant purulent drainage or odor. He did not understand what the [facility] call was about and therefore he does not have the topical compounded antibiotic. 07/13/2022: The right ankle wound again has hypertrophic granulation tissue, but less so than at his previous visit. The ischial wound has improved tremendously the use of the [employer] topical antibiotic. No significant change to the left lateral ankle wound; it is fibrotic with slough present. The skin of both of  his feet, especially on the right, has [name] yeasty appearance. 08/10/2022: There is again hypertrophic granulation tissue on the right anterior ankle wound. Both feet are about the same. The left lateral ankle wound is [name] little bit desiccated and has some slough buildup. He unfortunately suffered [name] new injury when he was removing his pants and they caught his bandage which caused [name] large skin tear on his left ischium, just distal to the existing wound. The existing left ischial wound, however, is significantly better with just [name] little light slough on the surface. 08/31/2022: The right anterior ankle wound is [name] lot smaller today underneath some eschar. No accumulation of hypertrophic granulation tissue. The left lateral ankle wound has some slough on the surface but is better in terms of moisture balance this week. The left dorsal foot does not really have any openings on it today. The right dorsal foot has some slough and eschar accumulation. His gluteal ulcer is basically closed aside from 2 small areas that are oozing [name] bit. 09/21/2022: The right anterior ankle wound is down to just [name] tiny pinhole. The left lateral ankle wound has accumulated slough again, but moisture balance is good. Left and right dorsal feet both have 2 small openings with some slough in them. The gluteal ulcer, unfortunately has opened up substantially. 10/05/2022: The right anterior ankle wound has reopened and has [name] fair amount of slough on the surface. The left lateral ankle wound has accumulated more slough, as well.  He was approved for Apligraf, but the wound is not clean enough yet. There is some slough buildup on the dorsal right foot wound, as well as on his ischium. 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 10/26/2022: The right anterior ankle wound has closed again. The left lateral ankle wound looks quite [name] bit better. It is ready for Apligraf but we do not have  1 here for him. His ischium continues to get worse, with the wound larger and deeper. It really seems like this is [name] combination of pressure and friction. He has 2 new wounds, 1 on the plantar aspect of each foot. He says that he had some dry skin there that peeled away. Both are superficial. The dorsum of his left foot does not have any new wound opening. The dorsum of his right foot has [name] bit of slough accumulation. 11/24/2022: The right anterior ankle wound has 2 small open areas, both covered with eschar. The left lateral ankle wound has closed and considerably with just [name] little slough on the surface. The ischium has improved most significantly, having closed in most of the open area with just [name] few small remaining superficial Nguyenthi, Tavita [name] ([id number]) [id number] Page 14 of 35 openings. The dorsal foot wounds are small and superficial. The bottoms of his feet are closed. 12/07/2022: The right anterior ankle wound remains open with 2 small areas, again with slough and eschar present. The left lateral ankle wound is down to about half [name] centimeter with just some slough on the surface. His right dorsal foot has opened up more and has both slough and eschar present. The ischium has continued to improve and now just has 2 small open areas that are very clean. 12/21/2022: He has [name] new wound on his right lateral ankle. There is some slough present. The left lateral ankle is quite [name] bit smaller with some slough buildup. His left ischial ulcer is also nearly closed; there is just [name] tear in his skin that seems likely to be secondary to [name] transfer mishap. He has [name] new ulcer in his natal cleft that looks like it may be secondary to moisture. There is some slough in this area as well. The right anterior ankle is nearly closed. The right dorsal foot wound has some slough accumulation 01/04/2023: The anterior right ankle wound is closed, as is his ischial ulcer. The left lateral ankle  wound is nearly closed with just [name] little bit of slough accumulation. The ulcer in his natal cleft is about the same size with slough buildup there, as well. He has [name] new wound on his left lateral leg near the knee from rubbing on his wheelchair. There is slough present, but no signs of infection. The right dorsal foot wound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 studies show reflux on both sides in the femoral popliteal veins as well as the greater and lesser saphenous  veins bilaterally. Ultimately he is going to need to see vascular surgery about this issue. Hopefully when we can get his wounds and [name] little better shape. 02/19/16; the patient was able to complete [name] course of [facility]'s for MRSA in the face of multiple antibiotic allergies. Arterial studies showed an ABI of him 0.88 on the right 1.17 on the left the. Waveforms were biphasic at the posterior tibial and dorsalis pedis digital waveforms were normal. Right toe brachial index was 1.3 limited by shaking and edema. His venous study showed widespread reflux in the left at the common femoral vein the greater and lesser saphenous vein the greater and lesser saphenous vein on the right as well as the popliteal and femoral vein. The popliteal and femoral vein on the left did not show reflux. His wounds on the right leg give healed on the left he is still using Santyl. 02/26/16; patient completed [name] treatment with  Dalvance for MRSA in the wound with associated erythema. The erythema has not really resolved and I wonder if this is mostly venous inflammation rather than cellulitis. Still using Santyl. He is approved for Apligraf 03/04/16; there is less erythema around the wound. Both wounds require aggressive surgical debridement. Not yet ready for Apligraf 03/11/16; aggressive debridement again. Not ready for Apligraf 03/18/16 aggressive debridement again. 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 Has been to see vascular surgery he is being planned for [name] venous ablation 03/25/16; aggressive debridement again of both wound areas on the left lateral leg. He is due for ablation surgery on May 22. He is much closer to being ready for an Apligraf. Has [name] new area between the left first and second toes 04/01/16 aggressive debridement done of both wounds. The new wound at the base of between his second and first toes looks stable 04/08/16; continued aggressive debridement of both wounds on the left lower leg. He goes for his venous ablation on Monday. The new wound at the base of his first and second toes dorsally appears stable. 04/15/16; wounds aggressively debridement although the base of this looks considerably better Apligraf #1. He had ablation surgery on Monday I'll need to research these records. We only have approval for four Apligraf's 04/22/16; the patient is here for [name] wound check [Apligraf last week] intake nurse concerned about erythema around the wounds. Apparently [name] significant degree of drainage. The patient has chronic venous inflammation which I think accounts for most of this however I was asked to look at this today 04/26/16; the patient came back for check of possible cellulitis in his left foot however the Apligraf dressing was inadvertently removed therefore we elected to prep the wound for [name] second Apligraf. I put him on doxycycline on 6/1 the erythema in the foot 05/03/16 we did not remove the  dressing from the superior wound as this is where I put all of his last Apligraf. Surface debridement done with [name] curette of the lower wound which looks very healthy. The area on the left foot also looks quite satisfactory at the dorsal artery at the first and second toes 05/10/16; continue Apligraf to this. Her wound, Hydrafera to the lower wound. He has [name] new area on the right second toe. Left dorsal foot firstsecond toe also looks improved 05/24/16; wound dimensions must be smaller I was able to use Apligraf to all 3 remaining wound areas. 06/07/16 patient's last Apligraf was 2 weeks ago. He arrives today with the 2 wounds on his lateral left leg joined together.  This would have to be seen as [name] negative. He also has [name] small wound in his first and second toe on the left dorsally with quite [name] bit of surrounding erythema in the first second and third toes. This looks to be infected or inflamed, very difficult clinical call. 06/21/16: lateral left leg combined wounds. Adherent surface slough area on the left dorsal foot at roughly the fourth toe looks improved 07/12/16; he now has [name] single linear wound on the lateral left leg. This does not look to be [name] lot changed from when I lost saw this. The area on his dorsal left foot looks considerably better however. 08/02/16; no major change in the substantial area on his left lateral leg since last time. We have been using Hydrofera Blue for [name] prolonged period of time now. The area on his left foot is also unchanged from last review 07/19/16; the area on his dorsal foot on the left looks considerably smaller. He is beginning to have significant rims of epithelialization on the lateral left leg wound. This also looks better. 08/05/16; the patient came in for [name] nurse visit today. Apparently the area on his left lateral leg looks better and it was wrapped. However in general discussion the patient noted [name] new area on the dorsal aspect of his right second toe. The exact  etiology of this is unclear but likely relates to pressure. 08/09/16 really the area on the left lateral leg did not really look that healthy today perhaps slightly larger and measurements. The area on his dorsal right second toe is improved also the left foot wound looks stable to improved 08/16/16; the area on the last lateral leg did not change any of dimensions. Post debridement with [name] curet the area looked better. Left foot wound improved and the area on the dorsal right second toe is improved 08/23/16; the area on the left lateral leg may be slightly smaller both in terms of length and width. Aggressive debridement with [name] curette afterwards the tissue appears healthier. Left foot wound appears improved in the area on the dorsal right second toe is improved 08/30/16 patient developed [name] fever over the weekend and was seen in an urgent care. Felt to have [name] UTI and put on doxycycline. He has been since changed over the phone to [facility]. After we took off the wrap on his right leg today the leg is swollen warm and erythematous, probably more likely the source of the fever 09/06/16; have been using collagen to the major left leg wound, silver alginate to the area on his anterior foot/toes 09/13/16; the areas on his anterior foot/toes on both sides appear to be virtually closed. Extensive wound on the left lateral leg perhaps slightly narrower but each visit still covered an adherent surface slough 09/16/16 patient was in for his usual Thursday nurse visit however the intake nurse noted significant erythema of his dorsal right foot. He is also running [name] low- grade fever and having increasing spasms in the right leg 09/20/16 here for cellulitis involving his right great toes and forefoot. This is [name] lot better. Still requiring debridement on his left lateral leg. Santyl direct says he needs prior authorization. Therefore his wife cannot change this at home 09/30/16; the patient's extensive area on the left  lateral calf and ankle perhaps somewhat better. Using Santyl. The area on the left toes is healed and I think the area on his right dorsal foot is healed as well. There is no cellulitis or venous  inflammation involving the right leg. He is going to need compression stockings here. 10/07/16; the patient's extensive wound on the left lateral calf and ankle does not measure any differently however there appears to be less adherent surface slough using Santyl and aggressive weekly debridements 10/21/16; no major change in the area on the left lateral calf. Still the same measurement still very difficult to debridement adherent slough and nonviable subcutaneous tissue. This is not really been helped by several weeks of Santyl. Previously for 2 weeks I used Iodoflex for [name] short period. [name] prolonged course of Hydrofera Blue didn't really help. I'm not sure why I only used 2 weeks of Iodoflex on this there is no evidence of surrounding infection. He has [name] small area on the right second toe which looks as though it's progressing towards closure 10/28/16; the wounds on his toes appear to be closed. No major change in the left lateral leg wound although the surface looks somewhat better using Iodoflex. He has had previous arterial studies that were normal. He has had reflux studies and is status post ablation although I don't have any exact notes on which vein was ablated. I'll need to check the surgical record 11/04/16; he's had [name] reopening between the first and second toe on the left and right. No major change in the left lateral leg wound. There is what appears to be cellulitis of the left dorsal foot 11/18/16 the patient was hospitalized initially in [place] and then subsequently transferred to [facility] long and was admitted there from 11/09/16 through 11/12/16. He had developed progressive cellulitis on the right leg in spite of the doxycycline I gave him. I'd spoken to the hospitalist in [place] who  was concerned about continuing leukocytosis. CT scan is what I suggested this was done which showed soft tissue swelling without evidence of osteomyelitis or an underlying abscess blood cultures were negative. At [facility] he was treated with vancomycin and Primaxin and then add an infectious disease consult. Sweda, KAYHAN SOUFFRONT ([id number]) [id number] Page 21 of 35 He was transitioned to Ceftaroline. He has been making progressive improvement. Overall [name] severe cellulitis of the right leg. He is been using silver alginate to her original wound on the left leg. The wounds in his toes on the right are closed there is [name] small open area on the base of the left second toe 11/26/15; the patient's right leg is much better although there is still some edema here this could be reminiscent from his severe cellulitis likely on top of some degree of lymphedema. His left anterior leg wound has less surface slough as reported by her intake nurse. Small wound at the base of the left second toe 12/02/16; patient's right leg is better and there is no open wound here. His left anterior lateral leg wound continues to have [name] healthy-looking surface. Small wound at the base of the left second toe however there is erythema in the left forefoot which is worrisome 12/16/16; is no open wounds on his right leg. We took measurements for stockings. His left anterior lateral leg wound continues to have [name] healthy-looking surface. I'm not sure where we were with the Apligraf run through his insurance. We have been using Iodoflex. He has [name] thick eschar on the left first second toe interface, I suspect this may be fungal however there is no visible open 12/23/16; no open wound on his right leg. He has 2 small areas left of the linear wound that was remaining last week. We  have been using Prisma, I thought I have disclosed this week, we can only look forward to next week 01/03/17; the patient had concerning areas of  erythema last week, already on doxycycline for UTI through his primary doctor. The erythema is absolutely no better there is warmth and swelling both medially from the left lateral leg wound and also the dorsal left foot. 01/06/17- Patient is here for follow-up evaluation of his left lateral leg ulcer and bilateral feet ulcers. He is on oral antibiotic therapy, tolerating that. Nursing staff and the patient states that the erythema is improved from Monday. 01/13/17; the predominant left lateral leg wound continues to be problematic. I had put Apligraf on him earlier this month once. However he subsequently developed what appeared to be an intense cellulitis around the left lateral leg wound. I gave him Dalvance I think on 2/12 perhaps 2/13 he continues on cefdinir. The erythema is still present but the warmth and swelling is improved. I am hopeful that the cellulitis part of this control. I wouldn't be surprised if there is an element of venous inflammation as well. 01/17/17. The erythema is present but better in the left leg. His left lateral leg wound still does not have [name] viable surface buttons certain parts of this long thin wound it appears like there has been improvement in dimensions. 01/20/17; the erythema still present but much better in the left leg. I'm thinking this is his usual degree of chronic venous inflammation. The wound on the left leg looks somewhat better. Is less surface slough 01/27/17; erythema is back to the chronic venous inflammation. The wound on the left leg is somewhat better. I am back to the point where I like to try an Apligraf once again 02/10/17; slight improvement in wound dimensions. Apligraf #2. He is completing his doxycycline 02/14/17; patient arrives today having completed doxycycline last Thursday. This was supposed to be [name] nurse visit however once again he hasn't tense erythema from the medial part of his wound extending over the lower leg. Also erythema in his foot  this is roughly in the same distribution as last time. He has baseline chronic venous inflammation however this is [name] lot worse than the baseline I have learned to accept the on him is baseline inflammation 02/24/17- patient is here for follow-up evaluation. He is tolerating compression therapy. His voicing no complaints or concerns he is here anticipating an Apligraf 03/03/17; he arrives today with an adherent necrotic surface. I don't think this is surface is going to be amenable for Apligraf's. The erythema around his wound and on the left dorsal foot has resolved he is off antibiotics 03/10/17; better-looking surface today. I don't think he can tolerate Apligraf's. He tells me he had [name] wound VAC after [name] skin graft years ago to this area and they had difficulty with [name] seal. The erythema continues to be stable around this some degree of chronic venous inflammation but he also has recurrent cellulitis. We have been using Iodoflex 03/17/17; continued improvement in the surface and may be small changes in dimensions. Using Iodoflex which seems the only thing that will control his surface 03/24/17- He is here for follow up evaluation of his LLE lateral ulceration and ulcer to right dorsal foot/toe space. He is voicing no complaints or concerns, He is tolerating compression wrap. 03/31/17 arrives today with [name] much healthier looking wound on the left lower extremity. We have been using Iodoflex for [name] prolonged period of time which has for the first  time prepared and adequate looking wound bed although we have not had much in the way of wound dimension improvement. He also has [name] small wound between the first and second toe on the right 04/07/17; arrives today with [name] healthy-looking wound bed and at least the top 50% of this wound appears to be now her. No debridement was required I have changed him to [facility] last week after prolonged Iodoflex. He did not do well with Apligraf's. We've had [name] re-opening  between the first and second toe on the right 04/14/17; arrives today with [name] healthier looking wound bed contractions and the top 50% of this wound and some on the lesser 50%. Wound bed appears healthy. The area between the first and second toe on the right still remains problematic 04/21/17; continued very gradual improvement. Using [facility] 04/28/17; continued very gradual improvement in the left lateral leg venous insufficiency wound. His periwound erythema is very mild. We have been using Hydrofera Blue. Wound is making progress especially in the superior 50% 05/05/17; he continues to have very gradual improvement in the left lateral venous insufficiency wound. Both in terms with an length rings are improving. I debrided this every 2 weeks with #5 curet and we have been using Hydrofera Blue and again making good progress With regards to the wounds between his right first and second toe which I thought might of been tinea pedis he is not making as much progress very dry scaly skin over the area. Also the area at the base of the left first and second toe in [name] similar condition 05/12/17; continued gradual improvement in the refractory left lateral venous insufficiency wound on the left. Dimension smaller. Surface still requiring debridement using Hydrofera Blue 05/19/17; continued gradual improvement in the refractory left lateral venous ulceration. Careful inspection of the wound bed underlying rumination suggested some degree of epithelialization over the surface no debridement indicated. Continue Hydrofera Blue difficult areas between his toes first and third on the left than first and second on the right. I'm going to change to silver alginate from silver collagen. Continue ketoconazole as I suspect underlying tinea pedis 05/26/17; left lateral leg venous insufficiency wound. We've been using Hydrofera Blue. I believe that there is expanding epithelialization over the surface of the wound albeit  not coming from the wound circumference. This is [name] bit of an odd situation in which the epithelialization seems to be coming from the surface of the wound rather than in the exact circumference. There is still small open areas mostly along the lateral margin of the wound. He has unchanged areas between the left first and second and the right first second toes which I been treating for tenia pedis 06/02/17; left lateral leg venous insufficiency wound. We have been using Hydrofera Blue. Somewhat smaller from the wound circumference. The surface of the wound remains [name] bit on it almost epithelialized sedation in appearance. I use an open curette today debridement in the surface of all of this especially the edges Small open wounds remaining on the dorsal right first and second toe interspace and the plantar left first second toe and her face on the left 06/09/17; wound on the left lateral leg continues to be smaller but very gradual and very dry surface using Hydrofera Blue 06/16/17 requires weekly debridements now on the left lateral leg although this continues to contract. I changed to silver collagen last week because of dryness of the wound bed. Using Iodoflex to the areas on his first and  second toes/web space bilaterally 06/24/17; patient with history of paraplegia also chronic venous insufficiency with lymphedema. Has [name] very difficult wound on the left lateral leg. This has been gradually reducing in terms of with but comes in with [name] very dry adherent surface. High switch to silver collagen [name] week or so ago with hydrogel to keep the area moist. This is been refractory to multiple dressing attempts. He also has areas in his first and second toes bilaterally in the anterior and posterior web space. I had been using Iodoflex here after [name] prolonged course of silver alginate with ketoconazole was ineffective [question tinea pedis] 07/14/17; patient arrives today with [name] very difficult adherent material over his  left lateral lower leg wound. He also has surrounding erythema and poorly controlled edema. He was switched his Santyl last visit which the nurses are applying once during his doctor visit and once on [name] nurse visit. He was also reduced to 2 layer compression I'm not exactly sure of the issue here. 07/21/17; better surface today after 1 week of Iodoflex. Significant cellulitis that we treated last week also better. [Doxycycline] 07/28/17 better surface today with now 2 weeks of Iodoflex. Significant cellulitis treated with doxycycline. He has now completed the doxycycline and he is back to his usual degree of chronic venous inflammation/stasis dermatitis. He reminds me he has had ablations surgery here 08/04/17; continued improvement with Iodoflex to the left lateral leg wound in terms of the surface of the wound although the dimensions are better. 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 Problematic areas on the plantar aspect of the first second toe web space on the left and the dorsal aspect of the first second toe web space on the right. At one point I felt these were probably related to chronic fungal infections in treated him aggressively for this although we have not made any improvement here. 08/11/17; left lateral leg. Surface continues to improve with the Iodoflex although we are not seeing much improvement in overall wound dimensions. Areas on his plantar left foot and right foot show no improvement. In fact the right foot looks somewhat worse 08/18/17; left lateral leg. We changed to [facility] Blue last week after [name] prolonged course of Iodoflex which helps get the surface better. It appears that the wound with is improved. Continue with difficult areas on the left dorsal first second and plantar first second on the right 09/01/17; patient arrives in clinic today having had [name] temperature of 103 yesterday. He was seen in the ER  and [facility]. The patient was concerned he could Christman, Kyce [name] ([id number]) [id number] Page 22 of 35 have cellulitis again in the right leg however they diagnosed him with [name] UTI and he is now on Keflex. He has [name] history of cellulitis which is been recurrent and difficult but this is been in the left leg, in the past 5 use doxycycline. He does in and out catheterizations at home which are risk factors for UTI 09/08/17; patient will be completing his Keflex this weekend. The erythema on the left leg is considerably better. He has [name] new wound today on the medial part of the right leg small superficial almost looks like [name] skin tear. He has worsening of the area on the right dorsal first and second toe. His major area on the left lateral leg is better. Using Hydrofera Blue on all areas 09/15/17; gradual reduction in width on the long wound in  the left lateral leg. No debridement required. He also has wounds on the plantar aspect of his left first second toe web space and on the dorsal aspect of the right first second toe web space. 09/22/17; there continues to be very gradual improvements in the dimensions of the left lateral leg wound. He hasn't round erythematous spot with might be pressure on his wheelchair. There is no evidence obviously of infection no purulence no warmth He has [name] dry scaled area on the plantar aspect of the left first second toe Improved area on the dorsal right first second toe. 09/29/17; left lateral leg wound continues to improve in dimensions mostly with an is still [name] fairly long but increasingly narrow wound. He has [name] dry scaled area on the plantar aspect of his left first second toe web space Increasingly concerning area on the dorsal right first second toe. In fact I am concerned today about possible cellulitis around this wound. The areas extending up his second toe and although there is deformities here almost appears to abut on the  nailbed. 10/06/17; left lateral leg wound continues to make very gradual progress. Tissue culture I did from the right first second toe dorsal foot last time grew MRSA and enterococcus which was vancomycin sensitive. This was not sensitive to clindamycin or doxycycline. He is allergic to Zyvox and sulfa we have therefore arrange for him to have dalvance infusion tomorrow. He is had this in the past and tolerated it well 10/20/17; left lateral leg wound continues to make decent progress. This is certainly reduced in terms of with there is advancing epithelialization.The cellulitis in the right foot looks better although he still has [name] deep wound in the dorsal aspect of the first second toe web space. Plantar left first toe web space on the left I think is making some progress 10/27/17; left lateral leg wound continues to make decent progress. Advancing epithelialization.using Hydrofera Blue The right first second toe web space wound is better-looking using silver alginate Improvement in the left plantar first second toe web space. Again using silver alginate 11/03/17 left lateral leg wound continues to make decent progress albeit slowly. Using [facility] The right per second toe web space continues to be [name] very problematic looking punched out wound. I obtained [name] piece of tissue for deep culture I did extensively treated this for fungus. It is difficult to imagine that this is [name] pressure area as the patient states other than going outside he doesn't really wear shoes at home The left plantar first second toe web space looked fairly senescent. Necrotic edges. This required debridement change to [facility] Blue to all wound areas 11/10/17; left lateral leg wound continues to contract. Using Hydrofera Blue On the right dorsal first second toe web space dorsally. Culture I did of this area last week grew MRSA there is not an easy oral option in this patient was multiple antibiotic allergies or  intolerances. This was only [name] rare culture isolate I'm therefore going to use Bactroban under silver alginate On the left plantar first second toe web space. Debridement is required here. This is also unchanged 11/17/17; left lateral leg wound continues to contract using Hydrofera Blue this is no longer the major issue. The major concern here is the right first second toe web space. He now has an open area going from dorsally to the plantar aspect. There is now wound on the inner lateral part of the first toe. Not [name] very viable surface on this. There  is erythema spreading medially into the forefoot. No major change in the left first second toe plantar wound 11/24/17; left lateral leg wound continues to contract using Hydrofera Blue. Nice improvement today The right first second toe web space all of this looks [name] lot less angry than last week. I have given him clindamycin and topical Bactroban for MRSA and terbinafine for the possibility of underlining tinea pedis that I could not control with ketoconazole. Looks somewhat better The area on the plantar left first second toe web space is weeping with dried debris around the wound 12/01/17; left lateral leg wound continues to contract he Hydrofera Blue. It is becoming thinner in terms of with nevertheless it is making good improvement. The right first second toe web space looks less angry but still [name] large necrotic-looking wounds starting on the plantar aspect of the right foot extending between the toes and now extensively on the base of the right second toe. I gave him clindamycin and topical Bactroban for MRSA anterior benefiting for the possibility of underlying tinea pedis. Not looking better today The area on the left first/second toe looks better. Debrided of necrotic debris 12/05/17* the patient was worked in urgently today because over the weekend he found blood on his incontinence bad when he woke up. He was found to have an ulcer by his wife who  does most of his wound care. He came in today for [place] to look at this. He has not had [name] history of wounds in his buttocks in spite of his paraplegia. 12/08/17; seen in follow-up today at his usual appointment. He was seen earlier this week and found to have [name] new wound on his buttock. We also follow him for wounds on the left lateral leg, left first second toe web space and right first second toe web space 12/15/17; we have been using Hydrofera Blue to the left lateral leg which has improved. The right first second toe web space has also improved. Left first second toe web space plantar aspect looks stable. The left buttock has worsened using Santyl. Apparently the buttock has drainage 12/22/17; we have been using Hydrofera Blue to the left lateral leg which continues to improve now 2 small wounds separated by normal skin. He tells [place] he had [name] fever up to 100 yesterday he is prone to UTIs but has not noted anything different. He does in and out catheterizations. The area between the first and second toes today does not look good necrotic surface covered with what looks to be purulent drainage and erythema extending into the third toe. I had gotten this to something that I thought look better last time however it is not look good today. He also has [name] necrotic surface over the buttock wound which is expanded. I thought there might be infection under here so I removed [name] lot of the surface with [name] #5 curet though nothing look like it really needed culturing. He is been using Santyl to this area 12/27/17; his original wound on the left lateral leg continues to improve using Hydrofera Blue. I gave him samples of Baxdella although he was unable to take them out of fear for an allergic reaction ["lump in his throat"].the culture I did of the purulent drainage from his second toe last week showed both enterococcus and [name] set Enterobacter I was also concerned about the erythema on the bottom of his foot although  paradoxically although this looks somewhat better today. 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 We've been using silver alginate here. They came up today that he sleeps in [name] recliner, I'm not sure why but I asked him to stop this 01/03/18; his original wound we've been using Hydrofera Blue is now separated into 2 areas. Ulcer on his left buttock is better he is off the recliner and sleeping in bed Finally both wound areas between his first and second toes also looks some better 01/10/18; his original wound on the left lateral leg is now separated into 2 wounds we've been using Hydrofera Blue Ulcer on his left buttock has some drainage. There is [name] small probing site going into muscle layer superiorly.using silver alginate -He arrives today with [name] deep tissue injury on the left heel The wound on the dorsal aspect of his first second toe on the left looks [name] lot betterusing silver alginate ketoconazole The area on the first second toe web space on the right also looks [name] lot bette 01/17/18; his original wound on the left lateral leg continues to progress using Hydrofera Blue Ulcer on his left buttock also is smaller surface healthier except for [name] small probing site going into the muscle layer superiorly. 2.4 cm of tunneling in this area DTI on his left heel we have only been offloading. Looks better than last week no threatened open no evidence of infection the wound on the dorsal aspect of the first second toe on the left continues to look like it's regressing we have only been using silver alginate and terbinafine orally The area in the first second toe web space on the right also looks to be [name] lot better using silver alginate and terbinafine I think this was prompted by tinea pedis 01/31/18; the patient was hospitalized in [place] last week apparently for [name] complicated UTI. He was discharged on cefepime he  does in and out catheterizations. In the hospital he was discovered M I don't mild elevation of AST and ALT and the terbinafine was stopped.predictably the pressure ulcer on s his buttock looks betterusing silver alginate. The area on the left lateral leg also is better using Hydrofera Blue. The area between the first and second toes on the left better. First and second toes on the right still substantial but better. Finally the DTI on the left heel has held together and looks like it's resolving 02/07/18-he is here in follow-up evaluation for multiple ulcerations. He has new injury to the lateral aspect of the last issue [name] pressure ulcer, he states this is Rodrigue, [name] ([id number]) [id number] Page 23 of 35 from adhesive removal trauma. He states he has tried multiple adhesive products with no success. All other ulcers appear stable. The left heel DTI is resolving. We will continue with same treatment plan and follow-up next week. 02/14/18; follow-up for multiple areas. He has [name] new area last week on the lateral aspect of his pressure ulcer more over the posterior trochanter. The original pressure ulcer looks quite stable has healthy granulation. 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 Almost fully epithelialized on the original superior area using Hydrofera Blue DTI on the left heel has peeled off this week to reveal [name] small superficial wound under denuded skin and subcutaneous tissue Both areas between the first and second toes look better including nothing open on the left 02/21/18; The patient's wounds on  his left ischial tuberosity and posterior left greater trochanter actually looked better. He has [name] large area of irritation around the area which I think is contact dermatitis. I am doubtful that this is fungal His original wound on the left lateral calf  continues to improve we have been using Hydrofera Blue There is no open area in the left first second toe web space although there is [name] lot of thick callus The DTI on the left heel required debridement today of necrotic surface eschar and subcutaneous tissue using silver alginate Finally the area on the right first second toe webspace continues to contract using silver alginate and ketoconazole 02/28/18 Left ischial tuberosity wounds look better using silver alginate. Original wound on the left calf only has one small open area left using Hydrofera Blue DTI on the left heel required debridement mostly removing skin from around this wound surface. Using silver alginate The areas on the right first/second toe web space using silver alginate and ketoconazole 03/08/18 on evaluation today patient appears to be doing decently well as best I can tell in regard to his wounds. This is the first time that I have seen him as he generally is followed by Dr. Leanord Hawking. With that being said none of his wounds appear to be infected he does have an area where there is some skin covering what appears to be [name] new wound on the left dorsal surface of his great toe. This is right at the nail bed. With that being said I do believe that debrided away some of the excess skin can be of benefit in this regard. Otherwise he has been tolerating the dressing changes without complication. 03/14/18; patient arrives today with the multiplicity of wounds that we are following. He has not been systemically unwell Original wound on the left lateral calf now only has 2 small open areas we've been using Hydrofera Blue which should continue The deep tissue injury on the left heel requires debridement today. We've been using silver alginate The left first second toe and the right first second toe are both are reminiscence what I think was tinea pedis. Apparently some of the callus Surface between the toes was removed last week when it started  draining. Purulent drainage coming from the wound on the ischial tuberosity on the left. 03/21/18-He is here in follow-up evaluation for multiple wounds. 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 He tolerated debridement. The only change to last week's recommendations is to discontinue antifungal cream between toes. He will follow-up next week 03/28/18; following up for multiple wounds;Concern this week is streaking redness and swelling in the right foot. He is going to need antibiotics for this. 03/31/18; follow-up for right foot cellulitis. Streaking redness and swelling in the right foot on 03/28/18. 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 I put him on clindamycin 300 mg every 6 and brought him in for [name] quick check. He has an open wound between his first and second toes on the right foot as [name] potential source. 04/04/18; Right foot cellulitis is resolving he is completing clindamycin. This is truly good news Left lateral calf wound which is initial wound only has one small open area inferiorly this is close to healing out. He has compression stockings. We will use Hydrofera Blue right down to the epithelialization of this Nonviable surface on the left heel which was initially pressure with [name] DTI. We've been using Hydrofera Blue. I'm  going to switch this back to silver alginate Left first second toe/tinea pedis this looks better using silver alginate Right first second toe tinea pedis using silver alginate Large pressure ulcers on theLeft ischial tuberosity. Small wound here Looks better. I am uncertain about the surface over the large wound. Using silver alginate 04/11/18; Cellulitis in the right foot is resolved Left lateral calf wound which was his original wounds still has 2 tiny open areas remaining this is just about closed Nonviable surface on the left heel is better but still requires  debridement Left first second toe/tinea pedis still open using silver alginate Right first second toe wound tinea pedis I asked him to go back to using ketoconazole and silver alginate Large pressure ulcers on the left ischial tuberosity this shear injury here is resolved. Wound is smaller. No evidence of infection using silver alginate 04/18/18; Patient arrives with an intense area of cellulitis in the right mid lower calf extending into the right heel area. Bright red and warm. Smaller area on the left anterior leg. He has [name] significant history of MRSA. He will definitely need antibioticsdoxycycline He now has 2 open areas on the left ischial tuberosity the original large wound and now [name] satellite area which I think was above his initial satellite areas. Not [name] wonderful surface on this satellite area surrounding erythema which looks like pressure related. His left lateral calf wound again his original wound is just about closed Left heel pressure injury still requiring debridement Left first second toe looks [name] lot better using silver alginate Right first second toe also using silver alginate and ketoconazole cream also looks better 04/20/18; the patient was worked in early today out of concerns with his cellulitis on the right leg. I had started him on doxycycline. This was 2 days ago. His wife was concerned about the swelling in the area. Also concerned about the left buttock. He has not been systemically unwell no fever chills. No nausea vomiting or diarrhea 04/25/18; the patient's left buttock wound is continued to deteriorate he is using Hydrofera Blue. He is still completing clindamycin for the cellulitis on the right leg although all of this looks better. 05/02/18 Left buttock wound still with [name] lot of drainage and [name] very tightly adherent fibrinous necrotic surface. He has [name] deeper area superiorly The left lateral calf wound is still closed DTI wound on the left heel necrotic surface  especially the circumference using Iodoflex Areas between his left first second toe and right first second toe both look better. Dorsally and the right first second toe he had [name] necrotic surface although at smaller. In using silver alginate and ketoconazole. I did [name] culture last week which was [name] deep tissue culture of the reminiscence of the open wound on the right first second toe dorsally. This grew [name] few Acinetobacter and [name] few methicillin-resistant staph aureus. Nevertheless the area actually this week looked better. I didn't feel the need to specifically address this at least in terms of systemic antibiotics. 05/09/18; wounds are measuring larger more drainage per our intake. We are using Santyl covered with alginate on the large superficial buttock wounds, Iodosorb on the left heel, ketoconazole and silver alginate to the dorsal first and second toes bilaterally. 05/16/18; The area on his left buttock better in some aspects although the area superiorly over the ischial tuberosity required an extensive debridement.using Santyl Left heel appears stable. Using Iodoflex The areas between his first and second toes are not bad however there is  spreading erythema up the dorsal aspect of his left foot this looks like cellulitis again. He is insensate the erythema is really very brilliant.o Erysipelas He went to see an allergist days ago because he was itching part of this he had lab work done. This showed [name] white count of 15.1 with 70% neutrophils. Hemoglobin of 11.4 and [name] platelet count of 659,000. Last white count we had in Epic was [name] 2-1/2 years ago which was 25.9 but he was ill at the time. He was able to show me some lab work that was done by his primary physician the pattern is about the same. 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 But prompted me to go ahead and do x-rays of both feet and the pelvis rule out osteomyelitis. He also had [name]  comprehensive metabolic panel this was reasonably normal his albumin was 3.7 liver function tests BUN/creatinine all normal [name], [name] ([id number]) [id number] Page 24 of 35 05/23/18; x-rays of both his feet from last week were negative for underlying pulmonary abnormality. The x-ray of his pelvis however showed mild irregularity in the left ischial which may represent some early osteomyelitis. The wound in the left ischial continues to get deeper clearly now exposed muscle. Each week necrotic surface material over this area. Whereas the rest of the wounds do not look so bad. The left ischial wound we have been using Santyl and calcium alginate T the left heel surface necrotic debris using Iodoflex o The left lateral leg is still healed Areas on the left dorsal foot and the right dorsal foot are about the same. There is some inflammation on the left which might represent contact dermatitis, fungal dermatitis I am doubtful cellulitis although this looks better than last week 05/30/18; CT scan done at Hospital did not show any osteomyelitis or abscess. Suggested the possibility of underlying cellulitis although I don't see [name] lot of evidence of this at the bedside The wound itself on the left buttock/upper thigh actually looks somewhat better. No debridement Left heel also looks better no debridement continue Iodoflex Both dorsal first second toe spaces appear better using Lotrisone. Left still required debridement 06/06/18; Intake reported some purulent looking drainage from the left gluteal wound. Using Santyl and calcium alginate Left heel looks better although still [name] nonviable surface requiring debridement The left dorsal foot first/second webspace actually expanding and somewhat deeper. I may consider doing [name] shave biopsy of this area Right dorsal foot first/second webspace appears stable to improved. Using Lotrisone and silver alginate to both these  areas 06/13/18 Left gluteal surface looks better. Now separated in the 2 wounds. No debridement required. Still drainage. We'll continue silver alginate Left heel continues to look better with Iodoflex continue this for at least another week Of his dorsal foot wounds the area on the left still has some depth although it looks better than last week. We've been using Lotrisone and silver alginate 06/20/18 Left gluteal continues to look better healthy tissue Left heel continues to look better healthy granulation wound is smaller. He is using Iodoflex and his long as this continues continue the Iodoflex Dorsal right foot looks better unfortunately dorsal left foot does not. There is swelling and erythema of his forefoot. He had minor trauma to this several days ago but doesn't think this was enough to have caused any tissue injury. Foot looks like cellulitis, we have had this problem before 06/27/18 on evaluation today patient appears to  be doing [name] little worse in regard to his foot ulcer. 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 Both of which would really be his only options for treating this infection. In the past he has been given and effusion of Orbactiv. This is done very well for him in the past again it's one time dosing IV antibiotic therapy. Subsequently I do believe this is something we're gonna need to see about doing at this point in time. Currently his other wounds seem to be doing somewhat better in my pinion I'm pretty happy in that regard. 07/03/18 on evaluation today patient's wounds actually appear to be doing fairly well. He has been tolerating the dressing changes without complication. All in all he seems to be showing signs of improvement. In regard to the antibiotics he has been dealing with infectious disease since I saw him last week as far  as getting this scheduled. In the end [facility]'s [facility] this coming Friday. In the meantime he has been continuing to perform the dressing changes in such as previous. There does not appear to be any evidence of infection worsengin at this time. 07/10/18; Since I last saw this man 2 weeks ago things have actually improved. IV antibiotics of resulted in less forefoot erythema although there is still some present. He is not systemically unwell Left buttock wounds 2 now have no depth there is increased epithelialization Using silver alginate Left heel still requires debridement using Iodoflex Left dorsal foot still with [name] sizable wound about the size of [name] border but healthy granulation Right dorsal foot still with [name] slitlike area using silver alginate 07/18/18; the patient's cellulitis in the left foot is improved in fact I think it is on its way to resolving. Left buttock wounds 2 both look better although the larger one has hypertension granulation we've been using silver alginate Left heel has some thick circumferential redundant skin over the wound edge which will need to be removed today we've been using Iodoflex Left dorsal foot is still [name] sizable wound required debridement using silver alginate The right dorsal foot is just about closed only [name] small open area remains here 07/25/18; left foot cellulitis is resolved Left buttock wounds 2 both look better. Hyper-granulation on the major area Left heel as some debris over the surface but otherwise looks [name] healthier wound. Using silver collagen Right dorsal foot is just about closed 07/31/18; arrives with our intake nurse worried about purulent drainage from the buttock. We had hyper-granulation here last week His buttock wounds 2 continue to look better Left heel some debris over the surface but measuring smaller. Right dorsal foot unfortunately has openings between the toes Left foot  superficial wound looks less aggravated. 08/07/18 Buttock wounds continue to look better although some of her granulation and the larger medial wound. silver alginate Left heel continues to look [name] lot better.silver collagen Left foot superficial wound looks less stable. Requires debridement. He has [name] new wound superficial area on the foot on the lateral dorsal foot. Right foot looks better using silver alginate without Lotrisone 08/14/2018; patient was in the ER last week diagnosed with [name] UTI. He is now on Cefpodoxime and Macrodantin. Buttock wounds continued to be smaller. Using silver alginate Left heel continues to look better using silver collagen Left  foot superficial wound looks as though it is improving Right dorsal foot area is just about healed. 08/21/2018; patient is completed his antibiotics for his UTI. He has 2 open areas on the buttocks. There is still not closed although the surface looks satisfactory. Using silver alginate Left heel continues to improve using silver collagen The bilateral dorsal foot areas which are at the base of his first and second toes/possible tinea pedis are actually stable on the left but worse on the right. The area on the left required debridement of necrotic surface. After debridement I obtained [name] specimen for PCR culture. The right dorsal foot which is been just about healed last week is now reopened 08/28/2018; culture done on the left dorsal foot showed coag negative staph both staph epidermidis and Lugdunensis. I think this is worthwhile initiating systemic treatment. I will use doxycycline given his long list of allergies. The area on the left heel slightly improved but still requiring debridement. The large wound on the buttock is just about closed whereas the smaller one is larger. Using silver alginate in this area 09/04/2018; patient is completing his doxycycline for the left foot although this continues to be [name] very difficult wound area with very  adherent necrotic debris. We are using silver alginate to all his wounds right foot left foot and the small wounds on his buttock, silver collagen on the left heel. 09/11/2018; once again this patient has intense erythema and swelling of the left forefoot. Lesser degrees of erythema in the right foot. He has [name] long list of allergies and intolerances. I will reinstitute doxycycline. 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 Using silver alginate Left heel also looks better using silver collagen Unfortunately both the areas on his feet look worse. The area on the left first second webspace is now gone through to the plantar part of his foot. The area on the left foot anteriorly is irritated with erythema and swelling in the forefoot. 09/25/2018 His wound on the left plantar heel looks better. Using silver collagen The area on the left buttock 2 small remnant areas. One is closed one is still open. Using silver alginate Ortner, Neilson [name] ([id number]) [id number] Page 25 of 35 The areas between both his first and second toes look worse. This in spite of long-standing antifungal therapy with ketoconazole and silver alginate which should have antifungal activity He has small areas around his original wound on the left calf one is on the bottom of the original scar tissue and one superiorly both of these are small and superficial but again given wound history in this site this is worrisome 10/02/2018 Left plantar heel continues to gradually contract using silver collagen Left buttock wound is unchanged using silver alginate The areas on his dorsal feet between his first and second toes bilaterally look about the same. I prescribed clindamycin ointment to see if we can address chronic staph colonization and also the underlying possibility of erythrasma The left lateral lower extremity wound is actually on the lateral part of his  ankle. Small open area here. We have been using silver alginate 10/09/2018; Left plantar heel continues to look healthy and contract. No debridement is required Left buttock slightly smaller with [name] tape injury wound just below which was new this week Dorsal feet somewhat improved I have been using clindamycin Left lateral looks lower extremity the actual open area looks worse although [name] lot of this is epithelialized. I  am going to change to silver collagen today He has [name] lot more swelling in the right leg although this is not pitting not red and not particularly warm there is [name] lot of spasm in the right leg usually indicative of people with paralysis of some underlying discomfort. We have reviewed his vascular status from 2017 he had [name] left greater saphenous vein ablation. I wonder about referring him back to vascular surgery if the area on the left leg continues to deteriorate. 10/16/2018 in today for follow-up and management of multiple lower extremity ulcers. His left Buttock wound is much lower smaller and almost closed completely. The wound to the left ankle has began to reopen with Epithelialization and some adherent slough. He has multiple new areas to the left foot and leg. The left dorsal foot without much improvement. Wound present between left great webspace and 2nd toe. Erythema and edema present right leg. Right LE ultrasound obtained on 10/10/18 was negative for DVT . 10/23/2018; Left buttock is closed over. Still dry macerated skin but there is no open wound. I suspect this is chronic pressure/moisture Left lateral calf is quite [name] bit worse than when I saw this last. There is clearly drainage here he has macerated skin into the left plantar heel. We will change the primary dressing to alginate Left dorsal foot has some improvement in overall wound area. Still using clindamycin and silver alginate Right dorsal foot about the same as the left using clindamycin and silver alginate The  erythema in the right leg has resolved. He is DVT rule out was negative Left heel pressure area required debridement although the wound is smaller and the surface is health 10/26/2018 The patient came back in for his nurse check today predominantly because of the drainage coming out of the left lateral leg with [name] recent reopening of his original wound on the left lateral calf. He comes in today with [name] large amount of surrounding erythema around the wound extending from the calf into the ankle and even in the area on the dorsal foot. He is not systemically unwell. He is not febrile. Nevertheless this looks like cellulitis. We have been using silver alginate to the area. I changed him to [name] regular visit and I am going to prescribe him doxycycline. The rationale here is [name] long list of medication intolerances and [name] history of MRSA. I did not see anything that I thought would provide [name] valuable culture 10/30/2018 Follow-up from his appointment 4 days ago with really an extensive area of cellulitis in the left calf left lateral ankle and left dorsal foot. I put him on doxycycline. He has [name] long list of medication allergies which are true allergy reactions. 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 In any case he is [name] lot better today. The erythema especially in the anterior and lateral left calf is better. He still has left ankle erythema. He also is complaining about increasing edema in the right leg we have only been using Kerlix Coban and he has been doing the wraps at home. Finally he has [name] spotty rash on the medial part of his upper left calf which looks like folliculitis or perhaps wrap occlusion type injury. Small superficial macules not pustules 11/06/18 patient arrives today with again [name] considerable degree of erythema around the wound on the left lateral calf extending into the dorsal ankle and dorsal foot. This is [name] lot worse  than  when I saw this last week. He is on doxycycline really with not [name] lot of improvement. He has not been systemically unwell Wounds on the; left heel actually looks improved. Original area on the left foot and proximity to the first and second toes looks about the same. He has superficial areas on the dorsal foot, anterior calf and then the reopening of his original wound on the left lateral calf which looks about the same The only area he has on the right is the dorsal webspace first and second which is smaller. He has [name] large area of dry erythematous skin on the left buttock small open area here. 11/13/2018; the patient arrives in much better condition. The erythema around the wound on the left lateral calf is [name] lot better. Not sure whether this was the clindamycin or the TCA and ketoconazole or just in the improvement in edema control [stasis dermatitis]. In any case this is [name] lot better. The area on the left heel is very small and just about resolved using silver collagen we have been using silver alginate to the areas on his dorsal feet 11/20/2018; his wounds include the left lateral calf, left heel, dorsal aspects of both feet just proximal to the first second webspace. He is stable to slightly improved. I did not think any changes to his dressings were going to be necessary 11/27/2018 he has [name] reopening on the left buttock which is surrounded by what looks like tinea or perhaps some other form of dermatitis. The area on the left dorsal foot has some erythema around it I have marked this area but I am not sure whether this is cellulitis or not. Left heel is not closed. Left calf the reopening is really slightly longer and probably worse 1/13; in general things look better and smaller except for the left dorsal foot. Area on the left heel is just about closed, left buttock looks better only [name] small wound remains in the skin looks better [using Lotrisone] 1/20; the area on the left heel only has [name]  few remaining open areas here. Left lateral calf about the same in terms of size, left dorsal foot slightly larger right lateral foot still not closed. The area on the left buttock has no open wound and the surrounding skin looks [name] lot better 1/27; the area on the left heel is closed. Left lateral calf better but still requiring extensive debridements. The area on his left buttock is closed. He still has the open areas on the left dorsal foot which is slightly smaller in the right foot which is slightly expanded. We have been using Iodoflex on these areas as well 2/3; left heel is closed. Left lateral calf still requiring debridement using Iodoflex there is no open area on his left buttock however he has dry scaly skin over [name] large area of this. Not really responding well to the Lotrisone. Finally the areas on his dorsal feet at the level of the first second webspace are slightly smaller on the right and about the same on the left. Both of these vigorously debrided with Anasept and gauze 2/10; left heel remains closed he has dry erythematous skin over the left buttock but there is no open wound here. Left lateral leg has come in and with. Still requiring debridement we have been using Iodoflex here. Finally the area on the left dorsal foot and right dorsal foot are really about the same extremely dry callused fissured areas. He does not yet have [name]  dermatology appointment 2/17; left heel remains closed. He has [name] new open area on the left buttock. The area on the left lateral calf is bigger longer and still covered in necrotic debris. No major change in his foot areas bilaterally. I am awaiting for [name] dermatologist to look on this. We have been using ketoconazole I do not know that this is been doing any good at all. 2/24; left heel remains closed. The left buttock wound that was new reopening last week looks better. The left lateral calf appears better also although still requires debridement. The major  area on his foot is the left first second also requiring debridement. We have been putting Prisma on all wounds. I do not believe that the ketoconazole has done too much good for his feet. He will use Lotrisone I am going to give him [name] 2-week course of terbinafine. We still do not have [name] dermatology appointment 3/2 left heel remains closed however there is skin over bone in this area I pointed this out to him today. The left buttock wound is epithelialized but still does not look completely stable. The area on the left leg required debridement were using silver collagen here. With regards to his feet we changed to Lotrisone last week and silver alginate. 3/9; left heel remains closed. Left buttock remains closed. The area on the right foot is essentially closed. The left foot remains unchanged. Slightly smaller on the left lateral calf. Using silver collagen to both of these areas 3/16-Left heel remains closed. Area on right foot is closed. Left lateral calf above the lateral malleolus open wound requiring debridement with easy bleeding. Left dorsal wound proximal to first toe also debrided. Left ischial area open new. Patient has been using Prisma with wrapping every 3 days. Dermatology appointment is apparently tomorrow.Patient has completed his terbinafine 2-week course with some apparent improvement according to him, there is still flaking and dry skin in his foot on the left 3/23; area on the right foot is reopened. The area on the left anterior foot is about the same still [name] very necrotic adherent surface. He still has the area on the Lahue, Emmanuell [name] ([id number]) [id number] Page 26 of 35 left leg and reopening is on the left buttock. He apparently saw dermatology although I do not have [name] note. According to the patient who is usually fairly well informed they did not have any good ideas. Put him on oral terbinafine which she is been on before. 3/30; using silver  collagen to all wounds. Apparently his dermatologist put him on doxycycline and rifampin presumably some culture grew staph. I do not have this result. He remains on terbinafine although I have used terbinafine on him before 4/6; patient has had [name] fairly substantial reopening on the right foot between the first and second toes. He is finished his terbinafine and I believe is on doxycycline and rifampin still as prescribed by dermatology. We have been using silver collagen to all his wounds although the patient reports that he thinks silver alginate does better on the wounds on his buttock. 4/13; the area on his left lateral calf about the same size but it did not require debridement. Left dorsal foot just proximal to the webspace between the first and second toes is about the same. Still nonviable surface. I note some superficial bronze discoloration of the dorsal part of his foot Right dorsal foot just proximal to the first and second toes also looks about the same. I still think there  may be the same discoloration I noted above on the left Left buttock wound looks about the same 4/20; left lateral calf appears to be gradually contracting using silver collagen. 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 The left dorsal foot wound is debrided of tightly adherent necrotic debris and really cleans up quite nicely. The right area is worse with expansion. I did not debride this it is now over the base of the second toe The area on his left buttock is smaller no debridement is required using silver collagen 5/4; left calf continues to make good progress. He arrives with erythema around the wounds on his dorsal foot which even extends to the plantar aspect. Very concerning for coexistent infection. He is finished the erythromycin I gave him for possible erythrasma this does not seem to have helped. The area on the left foot is about the same base of  the dorsal toes Is area on the buttock looks improved on the left 5/11; left calf and left buttock continued to make good progress. Left foot is about the same to slightly improved. Major problem is on the right foot. He has not had an x-ray. Deep tissue culture I did last week showed both Enterobacter and [name]. coli. I did not change the doxycycline I put him on empirically although neither 1 of these were plated to doxycycline. He arrives today with the erythema looking worse on both the dorsal and plantar foot. Macerated skin on the bottom of the foot. he has not been systemically unwell 5/18-Patient returns at 1 week, left calf wound appears to be making some progress, left buttock wound appears slightly worse than last time, left foot wound looks slightly better, right foot redness is marginally better. X-ray of both feet show no air or evidence of osteomyelitis. Patient is finished his Omnicef and terbinafine. He continues to have macerated skin on the bottom of the left foot as well as right 5/26; left calf wound is better, left buttock wound appears to have multiple small superficial open areas with surrounding macerated skin. X-rays that I did last time showed no evidence of osteomyelitis in either foot. He is finished cefdinir and doxycycline. I do not think that he was on terbinafine. He continues to have [name] large superficial open area on the right foot anterior dorsal and slightly between the first and second toes. I did send him to dermatology 2 months ago or so wondering about whether they would do [name] fungal scraping. I do not believe they did but did do [name] culture. We have been using silver alginate to the toe areas, he has been using antifungals at home topically either ketoconazole or Lotrisone. We are using silver collagen on the left foot, silver alginate on the right, silver collagen on the left lateral leg and silver alginate on the left buttock 6/1; left buttock area is healed. We have  the left dorsal foot, left lateral leg and right dorsal foot. We are using silver alginate to the areas on both feet and silver collagen to the area on his left lateral calf 6/8; the left buttock apparently reopened late last week. He is not really sure how this happened. He is tolerating the terbinafine. Using silver alginate to all wounds 6/15; left buttock wound is larger than last week but still superficial. Came in the clinic today with [name] report of purulence from the left lateral leg I did not identify any infection Both areas on his dorsal  feet appear to be better. He is tolerating the terbinafine. Using silver alginate to all wounds 6/22; left buttock is about the same this week, left calf quite [name] bit better. His left foot is about the same however he comes in with erythema and warmth in the right forefoot once again. Culture that I gave him in the beginning of May showed Enterobacter and [name]. coli. I gave him doxycycline and things seem to improve although neither 1 of these organisms was specifically plated. 6/29; left buttock is larger and dry this week. Left lateral calf looks to me to be improved. Left dorsal foot also somewhat improved right foot completely unchanged. The erythema on the right foot is still present. He is completing the Ceftin dinner that I gave him empirically [see discussion above.) 7/6 - All wounds look to be stable and perhaps improved, the left buttock wound is slightly smaller, per patient bleeds easily, completed ceftin, the right foot redness is less, he is on terbinafine 7/13; left buttock wound about the same perhaps slightly narrower. Area on the left lateral leg continues to narrow. Left dorsal foot slightly smaller right foot about the same. We are using silver alginate on the right foot and Hydrofera Blue to the areas on the left. Unna boot on the left 2 layer compression on the right 7/20; left buttock wound absolutely the same. Area on lateral leg  continues to get better. Left dorsal foot require debridement as did the right no major change in the 7/27; left buttock wound the same size necrotic debris over the surface. The area on the lateral leg is closed once again. His left foot looks better right foot about the same although there is some involvement now of the posterior first second toe area. He is still on terbinafine which I have given him for [name] month, not certain [name] centimeter major change 06/25/19-All wounds appear to be slightly improved according to report, left buttock wound looks clean, both foot wounds have minimal to no debris the right dorsal foot has minimal slough. We are using Hydrofera Blue to the left and silver alginate to the right foot and ischial wound. 8/10-Wounds all appear to be around the same, the right forefoot distal part has some redness which was not there before, however the wound looks clean and small. Ischial wound looks about the same with no changes 8/17; his wound on the left lateral calf which was his original chronic venous insufficiency wound remains closed. Since I last saw him the areas on the left dorsal foot right dorsal foot generally appear better but require debridement. The area on his left initial tuberosity appears somewhat larger to me perhaps hyper granulated and bleeds very easily. We have been using Hydrofera Blue to the left dorsal foot and silver alginate to everything else 8/24; left lateral calf remains closed. The areas on his dorsal feet on the webspace of the first and second toes bilaterally both look better. The area on the left buttock which is the pressure ulcer stage II slightly smaller. I change the dressing to Hydrofera Blue to all areas 8/31; left lateral calf remains closed. The area on his dorsal feet bilaterally look better. Using Hydrofera Blue. Still requiring debridement on the left foot. No change in the left buttock pressure ulcers however 9/14; left lateral calf  remains closed. Dorsal feet look quite [name] bit better than 2 weeks ago. Flaking dry skin also [name] lot better with the ammonium lactate I gave him 2 weeks  ago. The area on the left buttock is improved. He states that his Roho cushion developed [name] leak and he is getting [name] new one, in the interim he is offloading this vigorously 9/21; left calf remains closed. Left heel which was [name] possible DTI looks better this week. He had macerated tissue around the left dorsal foot right foot looks satisfactory and improved left buttock wound. I changed his dressings to his feet to silver alginate bilaterally. Continuing Hydrofera Blue on the left buttock. 9/28 left calf remains closed. Left heel did not develop anything [possible DTI] dry flaking skin on the left dorsal foot. Right foot looks satisfactory. Improved left buttock wound. We are using silver alginate on his feet Hydrofera Blue on the buttock. I have asked him to go back to the Lotrisone on his feet including the wounds and surrounding areas 10/5; left calf remains closed. The areas on the left and right feet about the same. [name] lot of this is epithelialized however debris over the remaining open areas. He is using Lotrisone and silver alginate. The area on the left buttock using Hydrofera Blue 10/26. Patient has been out for 3 weeks secondary to Covid concerns. He tested negative but I think his wife tested positive. He comes in today with the left foot substantially worse, right foot about the same. Even more concerning he states that the area on his left buttock closed over but then reopened and is considerably deeper in one aspect than it was before [stage III wound] 11/2; left foot really about the same as last week. Quarter sized wound on the dorsal foot just proximal to the first second toes. Surrounding erythema with areas of denuded epithelium. This is not really much different looking. Did not look like cellulitis this time however. Right foot area  about the same.. We have been using silver alginate alginate on his toes Left buttock still substantial irritated skin around the wound which I think looks somewhat better. We have been using Hydrofera Blue here. 11/9; left foot larger than last week and [name] very necrotic surface. Right foot I think is about the same perhaps slightly smaller. Debris around the circumference Torrez, [name] ([id number]) [id number] Page 27 of 35 also addressed. Unfortunately on the left buttock there is been [name] decline. Satellite lesions below the major wound distally and now [name] an additional one posteriorly we have been using Hydrofera Blue but I think this is [name] pressure issue 11/16; left foot ulcer dorsally again [name] very adherent necrotic surface. Right foot is about the same. Not much change in the pressure ulcer on his left buttock. 11/30; left foot ulcer dorsally basically the same as when I saw him 2 weeks ago. Very adherent fibrinous debris on the wound surface. Patient reports [name] lot of drainage as well. 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 We have been using Iodoflex, patient changed back to alginate because of the drainage. Area on his right dorsal foot really looks benign with [name] healthier surface certainly [name] lot better than on the left. Left buttock wounds all improved using Hydrofera Blue 12/7; left dorsal foot again no improvement. Tightly adherent debris. PCR culture I did last week only showed likely skin contaminant. I have gone ahead and done [name] punch biopsy of this which is about the last thing in terms of investigations I can think to do. He has known venous insufficiency and venous hypertension and this could be the issue here. The  area on the right foot is about the same left buttock slightly worse according to our intake nurse secondary to [facility] Blue sticking to the wound 12/14; biopsy of the left foot that I did last  time showed changes that could be related to wound healing/chronic stasis dermatitis phenomenon no neoplasm. We have been using silver alginate to both feet. I change the one on the left today to Sorbact and silver alginate to his other 2 wounds 12/28; the patient arrives with the following problems; Major issue is the dorsal left foot which continues to be [name] larger deeper wound area. Still with [name] completely nonviable surface Paradoxically the area mirror image on the right on the right dorsal foot appears to be getting better. He had some loss of dry denuded skin from the lower part of his original wound on the left lateral calf. Some of this area looked [name] little vulnerable and for this reason we put him in wrap that on this side this week The area on his left buttock is larger. He still has the erythematous circular area which I think is [name] combination of pressure, sweat. This does not look like cellulitis or fungal dermatitis 11/26/2019; -Dorsal left foot large open wound with depth. Still debris over the surface. Using Sorbact The area on the dorsal right foot paradoxically has closed over He has [name] reopening on the left ankle laterally at the base of his original wound that extended up into the calf. This appears clean. The left buttock wound is smaller but with very adherent necrotic debris over the surface. We have been using silver alginate here as well The patient had arterial studies done in 2017. He had biphasic waveforms at the dorsalis pedis and posterior tibial bilaterally. ABI in the left was 1.17. Digit waveforms were dampened. He has slight spasticity in the great toes I do not think [name] TBI would be possible 1/11; the patient comes in today with [name] sizable reopening between the first and second toes on the right. This is not exactly in the same location where we have been treating wounds previously. According to our intake nurse this was actually fairly deep but 0.6 cm. The area on the  left dorsal foot looks about the same the surface is somewhat cleaner using Sorbact, his MRI is in 2 days. We have not managed yet to get arterial studies. The new reopening on the left lateral calf looks somewhat better using alginate. The left buttock wound is about the same using alginate 1/18; the patient had his ARTERIAL studies which were quite normal. ABI in the right at 1.13 with triphasic/biphasic waveforms on the left ABI 1.06 again with triphasic/biphasic waveforms. It would not have been possible to have done [name] toe brachial index because of spasticity. We have been using Sorbac to the left foot alginate to the rest of his wounds on the right foot left lateral calf and left buttock 1/25; arrives in clinic with erythema and swelling of the left forefoot worse over the first MTP area. This extends laterally dorsally and but also posteriorly. Still has an area on the left lateral part of the lower part of his calf wound it is eschared and clearly not closed. Area on the left buttock still with surrounding irritation and erythema. Right foot surface wound dorsally. The area between the right and first and second toes appears better. 2/1; The left foot wound is about the same. Erythema slightly better I gave him [name] week of doxycycline empirically  Right foot wound is more extensive extending between the toes to the plantar surface Left lateral calf really no open surface on the inferior part of his original wound however the entire area still looks vulnerable Absolutely no improvement in the left buttock wound required debridement. 2/8; the left foot is about the same. Erythema is slightly improved I gave him clindamycin last week. Right foot looks better he is using Lotrimin and silver alginate He has [name] breakdown in the left lateral calf. Denuded epithelium which I have removed Left buttock about the same were using Hydrofera Blue 2/15; left foot is about the same there is less surrounding  erythema. Surface still has tightly adherent debris which I have debriding however not making any progress Right foot has [name] substantial wound on the medial right second toe between the first and second webspace. Still an open area on the left lateral calf distal area. Buttock wound is about the same 2/22; left foot is about the same less surrounding erythema. Surface has adherent debris. Polymen Ag Right foot area significant wound between the first and second toes. We have been using silver alginate here Left lateral leg polymen Ag at the base of his original venous insufficiency wound Left buttock some improvement here 3/1; Right foot is deteriorating in the first second toe webspace. Larger and more substantial. We have been using silver alginate. Left dorsal foot about the same markedly adherent surface debris using PolyMem Ag Left lateral calf surface debris using PolyMem AG Left buttock is improved again using PolyMem Ag. He is completing his terbinafine. The erythema in the foot seems better. He has been on this for 2 weeks 3/8; no improvement in any wound area in fact he has [name] small open area on the dorsal midfoot which is new this week. He has not gotten his foot x-rays yet 3/15; his x-rays were both negative for osteomyelitis of both feet. No major change in any of his wounds on the extremities however his buttock wounds are better. We have been using polymen on the buttocks, left lower leg. Iodoflex on the left foot and silver alginate on the right 3/22; arrives in clinic today with the 2 major issues are the improvement in the left dorsal foot wound which for once actually looks healthy with [name] nice healthy wound surface without debridement. Using Iodoflex here. Unfortunately on the left lateral calf which is in the distal part of his original wound he came to the clinic here for there was purulent drainage noted some increased breakdown scattered around the original area and [name] small  area proximally. We we are using polymen here will change to silver alginate today. His buttock wound on the left is better and I think the area on the right first second toe webspace is also improved 3/29; left dorsal foot looks better. Using Iodoflex. Left ankle culture from deterioration last time grew [name]. coli, Enterobacter and Enterococcus. I will give him [name] course of cefdinir although that will not cover Enterococcus. The area on the right foot in the webspace of the first and second toe lateral first toe looks better. The area on his buttock is about healed Vascular appointment is on April 21. This is to look at his venous system vis--vis continued breakdown of the wounds on the left including the left lateral leg and left dorsal foot he. He has had previous ablations on this side 4/5; the area between the right first and second toes lateral aspect of the first toe  looks better. Dorsal aspect of the left first toe on the left foot also improved. Unfortunately the left lateral lower leg is larger and there is [name] second satellite wound superiorly. The usual superficial abrasions on the left buttock overall better but certainly not closed 4/12; the area between the right first and second toes is improved. Dorsal aspect of the left foot also slightly smaller with [name] vibrant healthy looking surface. No real change in the left lateral leg and the left buttock wound is healed He has an unaffordable co-pay for Apligraf. Appointment with vein and vascular with regards to the left leg venous part of the circulation is on 4/21 4/19; we continue to see improvement in all wound areas. Although this is minor. He has his vascular appointment on 4/21. The area on the left buttock has not reopened although right in the center of this area the skin looks somewhat threatened 4/26; the left buttock is unfortunately reopened. In general his left dorsal foot has [name] healthy surface and looks somewhat smaller although it  was not measured as such. The area between his first and second toe webspace on the right as [name] small wound against the first toe. Delmore, QADREE FLOREY ([id number]) [id number] Page 28 of 35 The patient saw vascular surgery. The real question I was asking was about the small saphenous vein on the left. He has previously ablated left greater saphenous vein. Nothing further was commented on on the left. Right greater saphenous vein without reflux at the saphenofemoral junction or proximal thigh there was no indication for ablation of the right greater saphenous vein duplex was negative for DVT bilaterally. They did not think there was anything from [name] vascular surgery point of view that could be offered. They ABIs within normal limits 5/3; only small open area on the left buttock. The area on the left lateral leg which was his original venous reflux is now 2 wounds both which look clean. We are using Iodoflex on the left dorsal foot which looks healthy and smaller. He is down to [name] very tiny area between the right first and second toes, using silver alginate 5/10; all of his wounds appear better. We have much better edema control in 4 layer compression on the left. This may be the factor that is allowing the left foot and left lateral calf to heal. He has external compression garments at home 04/14/20-All of his wounds are progressing well, the left forefoot is practically closed, left ischium appears to be about the same, right toe webspace is also smaller. The left lateral leg is about the same, continue using Hydrofera Blue to this, silver alginate to the ischium, Iodoflex to the toe space on the right 6/7; most of his wounds outside of the left buttock are doing well. The area on the left lateral calf and left dorsal foot are smaller. The area on the right foot in between the first and second toe webspace is barely visible although he still says there is some drainage here is the  only reason I did not heal this out. Unfortunately the area on the left buttock almost looks like he has [name] skin tear from tape. He has open wound and then [name] large flap of skin that we are trying to get adherence over an area just next to the remaining wound 6/21; 2 week follow-up. I believe is been here for nurse visits. Miraculously the area between his first and second toes on the left dorsal foot is closed over.  Still open on the right first second web space. The left lateral calf has 2 open areas. Distally this is more superficial. The proximal area had [name] little more depth and required debridement of adherent necrotic material. His buttock wound is actually larger we have been using silver alginate here 6/28; the patient's area on the left foot remains closed. Still open wet area between the first and second toes on the right and also extending into the plantar aspect. We have been using silver alginate in this location. He has 2 areas on the left lower leg part of his original long wounds which I think are better. We have been using Hydrofera Blue here. Hydrofera Blue to the left buttock which is stable 7/12; left foot remains closed. Left ankle is closed. May be [name] small area between his right first and second toes the only truly open area is on the left buttock. We have been using Hydrofera Blue here 7/19; patient arrives with marked deterioration especially in the left foot and ankle. We did not put him in [name] compression wrap on the left last week in fact he wore his juxta lite stockings on either side although he does not have an underlying stocking. He has [name] reopening on the left dorsal foot, left lateral ankle and [name] new area on the right dorsal ankle. More worrisome is the degree of erythema on the left foot extending on the lateral foot into the lateral lower leg on the left 7/26; the patient had erythema and drainage from the lateral left ankle last week. Culture of this grew MRSA resistant  to doxycycline and clindamycin which are the 2 antibiotics we usually use with this patient who has multiple antibiotic allergies including linezolid, trimethoprim sulfamethoxazole. I had give him an empiric doxycycline and he comes in the area certainly looks somewhat better although it is blotchy in his lower leg. He has not been systemically unwell. He has had areas on the left dorsal foot which is [name] reopening, chronic wounds on the left lateral ankle. Both of these I think are secondary to chronic venous insufficiency. The area between his first and second toes is closed as far as I can tell. He had [name] new wrap injury on the right dorsal ankle last week. Finally he has an area on the left buttock. We have been using silver alginate to everything except the left buttock we are using Hydrofera Blue 06/30/20-Patient returns at 1 week, has been given [name] sample dose pack of NUZYRA which is [name] tetracycline derivative [omadacycline], patient has completed those, we have been using silver alginate to almost all the wounds except the left ischium where we are using Hydrofera Blue all of them look better 8/16; since I last saw the patient he has been doing well. The area on the left buttock, left lateral ankle and left foot are all closed today. He has completed the [place] I gave him last time and tolerated this well. He still has open areas on the right dorsal ankle and in the right first second toe area which we are using silver alginate. 8/23; we put him in his bilateral external compression stockings last week as he did not have anything open on either leg except for concerning area between the right first and second toe. He comes in today with an area on the left dorsal foot slightly more proximal than the original wound, the left lateral foot but this is actually [name] continuation of the area he had on  the left lateral ankle from last time. As well he is opened up on the left buttock again. 8/30; comes in  today with things looking [name] lot better. The area on the left lower ankle has closed down as has the left foot but with eschar in both areas. The area on the dorsal right ankle is also epithelialized. Very little remaining of the left buttock wound. We have been using silver alginate on all wound areas 9/13; the area in the first second toe webspace on the right has fully epithelialized. He still has some vulnerable epithelium on the right and the ankle and the dorsal foot. He notes weeping. He is using his juxta lite stocking. On the left again the left dorsal foot is closed left lateral ankle is closed. We went to the juxta lite stocking here as well. Still vulnerable in the left buttock although only 2 small open areas remain here 9/27; 2-week follow-up. We did not look at his left leg but the patient says everything is closed. He is [name] bit disturbed by the amount of edema in his left foot he is using juxta lite stockings but asking about over the toes stockings which would be 30/40, will talk to him next time. According to him there is no open wound on either the left foot or the left ankle/calf He has an open area on the dorsal right calf which I initially point [name] wrap injury. He has superficial remaining wound on the left ischial tuberosity been using silver alginate although he says this sticks to the wound 10/5; we gave him 2-week follow-up but he called yesterday expressing some concerns about his right foot right ankle and the left buttock. He came in early. There is still no open areas on the left leg and that still in his juxta lite stocking 10/11; he only has 1 small area on the left buttock that remains measuring millimeters 1 mm. Still has the same irritated skin in this area. We recommended zinc oxide when this eventually closes and pressure relief is meticulously is he can do this. He still has an area on the dorsal part of his right first through third toes which is [name] bit irritated and  still open and on the dorsal ankle near the crease of the ankle. We have been using silver alginate and using his own stocking. He has nothing open on the left leg or foot 10/25; 2-week follow-up. Not nearly as good on the left buttock as I was hoping. For open areas with 5 looking threatened small. He has the erythematous irritated chronic skin in this area. 1 area on the right dorsal ankle. He reports this area bleeds easily Right dorsal foot just proximal to the base of his toes We have been using silver alginate. 11/8; 2-week follow-up. Left buttock is about the same although I do not think the wounds are in the same location we have been using silver alginate. I have asked him to use zinc oxide on the skin around the wounds. He still has [name] small area on the right dorsal ankle he reports this bleeds easily Right dorsal foot just proximal to the base of the toes does not have anything open although the skin is very dry and scaly He has [name] new opening on the nailbed of the left great toe. Nothing on the left ankle 11/29; 3-week follow-up. Left buttock has 2 open areas. And washing of these wounds today started bleeding easily. Suggesting very friable tissue. We  have been using silver alginate. Right dorsal ankle which I thought was initially [name] wrap injury we have been using silver alginate. Nothing open between the toes that I can see. He states the area on the left dorsal toe nailbed healed after the last visit in 2 or 3 days 12/13; 3-week follow-up. His left buttock now has 3 open areas but the original 2 areas are smaller using polymen here. Surrounding skin looks better. The right dorsal ankle is closed. He has [name] small opening on the right dorsal foot at the level of the third toe. In general the skin looks better here. He is wearing his juxta lite stocking on the left leg says there is nothing open 11/24/2020; 3 weeks follow-up. His left buttock still has the 3 open areas. We have been using  polymen but due to lack of response he changed to [facility] area. Surrounding skin is dry erythematous and irritated looking. There is no evidence of infection either bacterial or fungal however there is loss of surface epithelium He still has very dry skin in his foot causing irritation and erythema on the dorsal part of his toes. This is not responded to prolonged courses of antifungal simply looks dry and irritated 1/24; left buttock area still looks about the same he was unable to find the triad ointment that we had suggested. The area on the right lower leg just above the dorsal ankle has reopened and the areas on the right foot between the first second and second third toes and scaling on the bottom of the foot has been about the same for quite some time now. been using silver alginate to all wound areas 2/7; left buttock wound looked quite good although not much smaller in terms of surface area surrounding skin looks better. Only [name] few dry flaking areas on the right foot in between the first and second toes the skin generally looks better here [ammonium lactate]. Finally the area on the right dorsal ankle is closed 2/21; There is no open area on the right foot even between the right first and second toe. Skin around this area dorsally and plantar aspects look better. He has [name] reopening of the area on the right ankle just above the crease of the ankle dorsally. I continue to think that this is probably friction from spasms may be even this time with his stocking under the compression stockings. Wounds on his left buttock look about the same there [name] couple of areas that have reopened. He has [name] total square area of loss of epithelialization. This does Tsou, [name] ([id number]) [id number] Page 29 of 35 not look like infection it looks like [name] contact dermatitis but I just cannot determine to what 3/14; there is nothing on the right foot between the first and  second toes this was carefully inspected under illumination. Some chronic irritation on the dorsal part of his foot from toes 1-3 at the base. Nothing really open here substantially. Still has an area on the right foot/ankle that is actually larger and hyper granulated. His buttock area on the left is just about closed however he has chronic inflammation with loss of the surface epithelial layer 3/28; 2-week follow-up. In clinic today with [name] new wound on the left anterior mid tibia. Says this happened about 2 weeks ago. He is not really sure how wonders about the spasticity of his legs at night whether that could have caused this other than that he does not have [name] good  idea. He has been using topical antibiotics and silver alginate. The area on his right dorsal ankle seems somewhat better. Finally everything on his left buttock is closed. 4/11; 2-week follow-up. All of his wounds are better except for the area over the ischium and left buttock which have opened up widely again. At least part of this is covered in necrotic fibrinous material another part had rolled nonviable skin. The area on the right ankle, left anterior mid tibia are both [name] lot better. He had no open wounds on either foot including the areas between the first and second toes 4/25; patient presents for 2-week follow-up. He states that the wounds are overall stable. He has no complaints today and states he is using Hydrofera Blue to open wounds. 5/9; have not seen this man in over [name] month. For my memory he has open areas on the left mid tibia and right ankle. T oday he has new open area on the right dorsal foot which we have not had [name] problem with recently. He has the sustained area on the left buttock He is also changed his insurance at the beginning of the year [employer]. We will need prior authorizations for debridement 5/23; patient presents for 2-week follow-up. He has prior authorizations for debridement. He denies any  issues in the past 2 weeks with his wound care. He has been using Hydrofera Blue to all the wounds. He does report [name] circular rash to the upper left leg that is new. He denies acute signs of infection. 6/6; 2-week follow-up. The patient has open wounds on the left buttock which are worse than the last time I saw this about [name] month ago. He also has [name] new area to me on the left anterior mid tibia with some surrounding erythema. The area on the dorsal ankle on the right is closed but I think this will be [name] friction injury every time this area is exposed to either our wraps or his compression stockings caused by unrelenting spasms in this leg. 6/20; 2-week follow-up.  The patient has open wounds on the left buttock which is about the same. Using [facility] here. - The left mid tibia has [name] static amount of surrounding erythema. Also [name] raised area in the center. We have been using Hydrofera Blue here. Finally he has broken down in his dorsal right foot extending between the first and second toes and going to the base of the first and second toe webspace. I have previously assumed that this was severe venous hypertension 7/5; 2-week follow-up  The left buttock wound actually looks better. We are using Hydrofera Blue. He has extensive skin irritation around this area and I have not really been able to get that any better. I have tried Lotrisone i.[name]. antifungals and steroids. More most recently we have just been using Coloplast really looks about the same.  The left mid tibia which was new last week culture to have very resistant staph aureus. Not only methicillin-resistant but doxycycline resistant. The patient has [name] plethora of antibiotic allergies including sulfa, linezolid. I used topical bacitracin on this but he has not started this yet.  In addition he has an expanding area of erythema with [name] wound on the dorsal right foot. I did [name] deep tissue culture of this area today 7/12;  Left buttock area  actually looks better surrounding skin also looks less irritated.  Left mid tibia looks about the same. He is using bacitracin this is not worse  Right dorsal foot looks about the same as well.  The left first toe also looks about the same 7/19; left buttock wound continues to improve in terms of open areas  Left mid tibia is still concerning amount of swelling he is using bacitracin  Dorsal left first toe somewhat smaller  Right dorsal foot somewhat smaller 7/25; left buttock wound actually continues to improve  Left mid tibia area has less swelling. I gave him all my samples of new Nuzyra. This seems to have helped although the wound is still open it. His abrasion closed by here  Left dorsal great toe really no better. Still [name] very nonviable surface  Right dorsal foot perhaps some better.  We have been using bacitracin and silver nitrate to the areas on his lower legs and Hydrofera Blue to the area on the buttock. 8/16  Disappointed that his left buttock wound is actually more substantial. Apparently during the last nurse visit these were both very small. He has continued irritation to [name] large area of skin on his buttock. I have never been able to totally explain this although I think it some combination of the way he sits, pressure, moisture. He is not incontinent enough to contribute to this.  Left dorsal great toe still fibrinous debris on the surface that I have debrided today  Large area across the dorsal right toes.  The area on the left anterior mid tibia has less swelling. He completed the [place]. This does not look infected although the tissue is still fried 8/30; 2-week follow-up.  Left buttock areas not improved. We used Hydrofera Blue on this. Weeping wet with the surrounding erythema that I have not been able to control even with Lotrisone and topical Coloplast  Left dorsal great toe looks about the same  More substantial area again at the base of his toes on the left which is  new this week.  Area across the dorsal right toes looks improved  The left anterior mid tibia looks like it is trying to close 9/13; 2-week follow-up. Using silver alginate on all of his wounds. The left dorsal foot does not look any better. He has the area on the dorsal toe and also the areas at the base of all of his toes 1 through 3. On the right foot he has [name] similar pattern in [name] similar area. He has the area on his left mid tibia that looks fairly healthy. Finally the area on his left buttock looks somewhat bette 9/20; culture I did of the left foot which was [name] deep tissue culture last time showed [name]. coli he has erythema around this wound. Still [name] completely necrotic surface. His right dorsal foot looks about the same. He has [name] very friable surface to the left anterior mid tibia. Both buttock wounds look better. We have been using silver alginate to all wounds 10/4; he has completed the cephalexin that I gave him last time for the left foot. He is using topical gentamicin under silver alginate silver alginate being applied to all the wounds. Unfortunately all the wounds look irritated on his dorsal right foot dorsal left foot left mid tibia. I wonder if this could be [name] silver allergy. I am going to change him to [facility] on the lower extremity. The skin on the left buttock and left posterior thigh still flaking dry and irritated. This has continued no matter what I have applied topically to this. He has [name] solitary open wound which by itself does  not look too bad however the entire area of surrounding skin does not change no matter what we have applied here 10/18; the area on the left dorsal foot and right dorsal foot both look better. The area on the right extends into the plantar but not between his toes. We have been using silver alginate. He still has [name] rectangular erythematous area around the area on the left tibia. The wound itself is very small. Finally everything on his left buttock  looks [name] little larger the skin is erythematous 11/15; patient comes in with the left dorsal and right dorsal foot distally looking somewhat better. Still nonviable surface on the left foot which required debridement. He still has the area on the left anterior mid tibia although this looks somewhat better. He has [name] new area on the right lateral lower leg just above the ankle. Finally his left buttock looks terrible with multiple superficial open wounds geometric square shaped area of chronic erythema which I have not been able to sort through 11/29; right dorsal foot and left dorsal foot both look somewhat better. No debridement required. He has the fragile area on the left anterior mid tibia this looks and continues to look somewhat better. Right lateral lower leg just above the ankle we identified last time also looks better. In general the area on his left buttock looks improved. We are using Hydrofera Blue to all wound areas 12/13; right dorsal foot looks better. The area on the right lateral leg is healed. Left dorsal foot has 2 open areas both of which require debridement. The fragile area on the left anterior mid tibial looks better. Smaller area on his buttocks. Were using Hydrofera Blue Bourne, Heliodoro [name] ([id number]) [id number] Page 30 of 35 1/10; patient comes in with everything looking slightly larger and/or worse. This includes his left buttock, reopening of the left mid tibia, larger areas on the left dorsal foot and what looks to be [name] cellulitis on the right dorsal and plantar foot. We have been using Hydrofera Blue on all wounds. 1/17; right dorsal foot distally looks better today. The left foot has 2 open wounds that are about the same surrounding erythema. 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 No malignancy they did not  stain for fungus 1/24; his right distal foot is not closed dry and scaly but the wound looks like it is contracting. I did not debride anything here. Problem on his left dorsal foot with expanding erythema. Apparently there were problems last week getting the Riki Altes however it is now available at the [employer] [facility] but [name] week later. He is using ketoconazole and Coloplast to the left buttock along with Hydrofera Blue this actually looks quite [name] bit better today. 1/31; right dorsal foot again is dry and scaly but looking to contract. He has been using [name] moisturizer on his feet at my request but he is not sure which 1. The left dorsal foot wounds look about the same there is erythema here that I marked last week however after course of Nuzyra it certainly is not any better but not any worse either. Finally on his left buttock the skin continues to look better he has the original wound but [name] new substantial area towards the gluteal cleft. Almost like [name] skin tear. I used scissors to remove skin and subcutaneous tissue here silver nitrate and direct pressure 2/7; right dorsal  foot. This does not look too much different from last week. Some erythema skin dry and scaly. No debridement. Left dorsal great toe again still not much improvement. I did remove flaking dry skin and callus from around the edge. Finally on his left buttock. The skin is somewhat better in the periwound. Surface wounds are superficial somewhat better than last week. 01/26/2022: Is [name] little bit of [name] mystery as to why his wounds fail to respond to treatments and actually seem to get worse. This is my first encounter with this patient. He was previously followed by Dr. Leanord Hawking. Based upon my review of the chart, it seems that there is [name] little bit of [name] mystery as to why his wounds do not respond as anticipated to the interventions applied and sometimes even get worse. Biopsies have been performed and he was seen by dermatology  in [place], but that did not shed any light on the matter. T oday, his gluteal wound is larger, with substantial drainage, rather malodorous. The food wounds are not terrible, but he has [name] lot of callus and scaly skin around these. He is currently getting silver alginate on the gluteal wound, with idodoflex to the feet. He is using lotrisone on his legs for the dry, scaly skin. 02/09/2022: There has really been no change to any of his wounds. The gluteal wound less drainage and odor, but remains about the same size, the periwound skin remains oddly scaly. His lower extremity wounds also appear roughly the same size. They continue to accumulate [name] small amount of slough. The periwound on his feet and ankle wounds has dry eschar and loose dead skin. We have been using silver alginate on the gluteal wound and Iodoflex on his feet and ankle wounds. T the periwound around his gluteal lesion and Lotrisone on his feet and legs. o 02/23/2022: The right plantar foot wound is closed. The gluteal site looks small but has continued to produce hypertrophic granulation tissue. The foot wounds all look about the same on the dorsal surface of the right foot; on the left, there is only [name] small open area at the site of where his left great toenail would have been. 03/16/2022: The right ankle wound is healed. The right dorsal foot wound is about the same. The left dorsal foot wound is quite [name] bit smaller and the ischial wound is nearly closed. 03/30/2022: The right ankle wound reopened. Both dorsal foot wounds are quite [name] bit smaller. Unfortunately, he appears to have sheared part of his ischial wound open further, perhaps during [name] transfer. 04/13/2022: The right ankle wound has hypertrophic granulation tissue present. The dorsal foot wounds continue to decrease in size. The ischial wound looks about the same today, no better, no worse. 04/27/2022: The right ankle wound has closed. Unfortunately, it looks like some moisture  got underneath the dry skin on both of his dorsal feet and these wounds have expanded in size. The ischial wound remains the same with perhaps [name] little bit more slough accumulation than at our previous visit. 05/11/2022: The right ankle wound remains closed. There is [name] left anterior tibial wound that is small has patchy openings with accumulated slough. The dorsum of his right foot appears to be nearly healed with just [name] small punctate opening. The plantar surface of his right foot has [name] new opening that looks like he may have picked some skin there. His sacral ulcer has hypertrophic granulation tissue but has some slough accumulation. The dorsum of his left  foot has multiple open areas in [name] fairly ragged distribution. All of these have slough accumulated within them. 06/01/2022: The right ankle and left anterior tibial wound are both closed. Dorsum of his right foot and left foot both look substantially better with just tiny scattered openings The without any slough accumulation. He has sheared open new areas on his left gluteus and ischium. He says that his wheelchair cushion, which is air-filled, has [name] leak and so it keeps deflating. He is awaiting [name] new cushion. 06/15/2022: The right ankle wound has reopened and the fat layer is exposed. Both dorsal feet have just small openings with just [name] little bit of slough and eschar accumulation. The wound on his left gluteus and ischium is larger again today and has [name] foul odor. 06/29/2022: The right ankle wound has hypertrophic granulation tissue buildup. His dorsal foot wounds both look better with just some eschar on the surface. He has [name] new wound on his left lateral ankle. He is not sure how he acquired it but by appearance, it looks that he hit it on something, potentially his wheelchair or bed. The ischial wound is about the same but is cleaner without any significant purulent drainage or odor. He did not understand what the [facility] call was about and  therefore he does not have the topical compounded antibiotic. 07/13/2022: The right ankle wound again has hypertrophic granulation tissue, but less so than at his previous visit. The ischial wound has improved tremendously the use of the [facility] topical antibiotic. No significant change to the left lateral ankle wound; it is fibrotic with slough present. The skin of both of his feet, especially on the right, has [name] yeasty appearance. 08/10/2022: There is again hypertrophic granulation tissue on the right anterior ankle wound. Both feet are about the same. The left lateral ankle wound is [name] little bit desiccated and has some slough buildup. He unfortunately suffered [name] new injury when he was removing his pants and they caught his bandage which caused [name] large skin tear on his left ischium, just distal to the existing wound. The existing left ischial wound, however, is significantly better with just [name] little light slough on the surface. 08/31/2022: The right anterior ankle wound is [name] lot smaller today underneath some eschar. No accumulation of hypertrophic granulation tissue. The left lateral ankle wound has some slough on the surface but is better in terms of moisture balance this week. The left dorsal foot does not really have any openings on it today. The right dorsal foot has some slough and eschar accumulation. His gluteal ulcer is basically closed aside from 2 small areas that are oozing [name] bit. 09/21/2022: The right anterior ankle wound is down to just [name] tiny pinhole. The left lateral ankle wound has accumulated slough again, but moisture balance is good. Left and right dorsal feet both have 2 small openings with some slough in them. The gluteal ulcer, unfortunately has opened up substantially. 10/05/2022: The right anterior ankle wound has reopened and has [name] fair amount of slough on the surface. The left lateral ankle wound has accumulated more slough, as well. He was approved for Apligraf, but the  wound is not clean enough yet. There is some slough buildup on the dorsal right foot wound, as well as on his ischium. 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 10/26/2022: The right anterior ankle wound has closed again. The left lateral ankle wound  looks quite [name] bit better. It is ready for Apligraf but we do not have 1 here for him. His ischium continues to get worse, with the wound larger and deeper. It really seems like this is [name] combination of pressure and friction. He has 2 new wounds, 1 on the plantar aspect of each foot. He says that he had some dry skin there that peeled away. Both are superficial. The dorsum of his left foot Caine, [name] ([id number]) [id number] Page 31 of 35 does not have any new wound opening. The dorsum of his right foot has [name] bit of slough accumulation. 11/24/2022: The right anterior ankle wound has 2 small open areas, both covered with eschar. The left lateral ankle wound has closed and considerably with just [name] little slough on the surface. The ischium has improved most significantly, having closed in most of the open area with just [name] few small remaining superficial openings. The dorsal foot wounds are small and superficial. The bottoms of his feet are closed. 12/07/2022: The right anterior ankle wound remains open with 2 small areas, again with slough and eschar present. The left lateral ankle wound is down to about half [name] centimeter with just some slough on the surface. His right dorsal foot has opened up more and has both slough and eschar present. The ischium has continued to improve and now just has 2 small open areas that are very clean. 12/21/2022: He has [name] new wound on his right lateral ankle. There is some slough present. The left lateral ankle is quite [name] bit smaller with some slough buildup. His left ischial ulcer is also nearly closed; there is just [name] tear in his skin that  seems likely to be secondary to [name] transfer mishap. He has [name] new ulcer in his natal cleft that looks like it may be secondary to moisture. There is some slough in this area as well. The right anterior ankle is nearly closed. The right dorsal foot wound has some slough accumulation 01/04/2023: The anterior right ankle wound is closed, as is his ischial ulcer. The left lateral ankle wound is nearly closed with just [name] little bit of slough accumulation. The ulcer in his natal cleft is about the same size with slough buildup there, as well. He has [name] new wound on his left lateral leg near the knee from rubbing on his wheelchair. There is slough present, but no signs of infection. The right dorsal foot wound has [name] fairly heavy layer of slough buildup, as well. 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 He has been using his old wheelchair cushion for reasons that are unclear. The ulcer in his natal cleft has healed. The wound on his left lateral leg near the knee is smaller with some slough present. The right dorsal foot wound has accumulated slough again. The right lateral ankle wound is healed. The left lateral ankle wound is smaller again this week. 02/01/2023: His ischial ulcer has had [name] ton of drainage over the last 2 weeks and the drainage has an absolutely putrid odor. He has gone back to using his new wheelchair chair cushion, though. The wound on his left lateral leg near his knee has healed. The left lateral ankle wound is nearly healed. Both the dorsal foot wounds are larger and have [name] lot of slough accumulation and drainage. The wound on his right anterior ankle is healed but there is [name] new 1 just proximal to this, also with slough and eschar present. 02/15/2023:The  left lateral and right lateral ankle wounds are both smaller. The right anterior ankle wound has some eschar on the surface. Both dorsal feet have [name] lot of slough accumulation. The ischial ulcer has epithelialized in multiple  areas, leaving several smaller areas that are quite friable and still with [name] pungent odor. His new [facility] prescription should be delivered tomorrow. We are using silver alginate on everything at this point. 03/08/2023: Both dorsal foot wounds are about the same size and have [name] lot of slough buildup. The ischial ulcer has improved quite [name] bit. The odor has abated and the surfaces are clean. It is still quite friable and 1 area is oozing. The left lateral and right lateral ankle wounds are nearly closed. The right anterior ankle wound is healed. 03/22/2023: The right lateral ankle wound is healed. The left lateral ankle wound is tiny with just [name] little bit of slough accumulation. The dorsal foot wounds look about the same. The ischial ulcer is considerably smaller and there is no odor. There is slight slough accumulation. 04/14/2023: The right lateral ankle wound has reopened. It is fairly superficial but does have some hematoma in the tissue suggesting trauma or pressure as the etiology. The left lateral ankle wound is essentially [name] pinhole under some dry eschar. The dorsal foot wounds are stable. The ischial ulcer, unfortunately, has expanded to 5 separate openings in his skin. The wounds are clean but friable. 04/28/2023: The left lateral ankle wound has expanded and is [name] little bit deeper today. Dorsal foot wounds are stable. The ischial ulcer is less friable and does not have the strong odor that has been present. The right lateral ankle wound is [name] little bit smaller with some slough and eschar accumulation. 05/10/2023: The total area of the ischial wound is smaller. The left lateral ankle wound is nearly closed. Both dorsal foot wounds are quite [name] bit smaller. The right ankle wound has healed. 05/31/2023: The left lateral leg wound is down to just [name] superficial small area that almost looks like [name] scrape rather than the deeper ulcer it has been. His ischial ulcer is down to just 1 open spot. It is clean  but quite friable. Both dorsal foot ulcers are considerably smaller and have much less slough than usual. 06/21/2023: His ischial ulcer is still just 1 open area, but it is [name] little larger today. The surrounding skin, however, is in better condition Unfortunately, the left lateral leg wound is [name] little bit larger today. It looks as though some dry skin was peeled away. Both dorsal foot ulcers continue to contract; they are the smallest I have ever seen them and have only minimal slough on the surface. 07/12/2023: All of his wounds have deteriorated and his right anterior ankle ulcer is open again. He has had [name] lot more drainage, although he states it is clear and does not have any significant odor. This is resulted in breakdown around the left lateral ankle ulcer, both dorsal feet and has extended to the plantar surface of both feet. His ischial ulcer is also larger; this appears to be more due to shear than anything else. 08/02/2023: All of his wounds look better. There does not appear to be any rhyme or reason as to why they wax and wane as they do. 08/23/2023: The right dorsal ankle wound is covered with [name] extremely thin layer of skin but is still purple underneath. The left lateral ankle wound has quite [name] bit of slough on it and it  appears that the tendon is exposed again. His ischial ulcer is down to 4 open areas, all of which are smaller than previously, but the surfaces remain friable and have some slough on them today. The dorsal foot wounds are both considerably smaller and the right plantar ulcer is closed. The left plantar ulcer is nearly closed. 09/26/2023: The right dorsal ankle wound is basically healed. The left lateral ankle wound is smaller. His ischial ulcers have merged and are [name] bit larger today. Both dorsal foot wounds are stable. The left plantar ulcer is closed but the right plantar ulcer has reopened. 12/3; the patient comes in today for what I gather is monthly follow-up.  Unfortunately things are are not going very well. He has multiple wounds including on his right dorsal foot left dorsal foot left lateral lower extremity left posterior lower extremity left buttock and right anterior lower extremity. 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 He has been using mupirocin silver alginate to all the wounds and his own compression stockings in his legs. I am not sure how much he keeps his legs elevated but he has been coached on this before Objective Constitutional Sitting or standing Blood Pressure is within target range for patient.. Pulse regular and within target range for patient.Marland Kitchen Respirations regular, non-labored and within target range.. Temperature is normal and within the target range for the patient.. No change in height.. Appears in no distress. Vitals Time Taken: 8:30 AM, Height: 70 in, Weight: 216 lbs, BMI: 31, Temperature: 98.3 F, Pulse: 130 bpm, Respiratory Rate: 18 breaths/min, Blood Pressure: Biggs, Hulbert [name] ([id number]) [id number] Page 32 of 35 145/83 mmHg. General Notes: Wound exam; the right dorsal foot is closed over. The left dorsal foot the wounds on both lower legs seem about the same. The left lateral lower leg had [name] nonviable surface that I removed with [name] #5 curette hemostasis with direct pressure. Similar fashion the right anterior lower leg also looks about the same. His left buttock wound I think is deteriorated by description. At the superior aspect of this the granulation tissue is [name] dull gray color malodorous. I used [name] #5 curette to remove this in the subcutaneous tissue. Hemostasis with direct pressure. [name] specimen was obtained for PCR Integumentary (Hair, Skin) Wound #52 status is Open. Original cause of wound was Gradually Appeared. The date acquired was: 03/27/2021. The wound has been in treatment 134  weeks. The wound is located on the Right,Dorsal Foot. The wound measures 1.4cm length x 5.4cm width x 0.1cm depth; 5.938cm^2 area and 0.594cm^3 volume. There is Fat Layer (Subcutaneous Tissue) exposed. There is no tunneling or undermining noted. There is [name] medium amount of serous drainage noted. The wound margin is distinct with the outline attached to the wound base. There is small (1-33%) pink granulation within the wound bed. There is [name] large (67-100%) amount of necrotic tissue within the wound bed including Adherent Slough. The periwound skin appearance had no abnormalities noted for texture. The periwound skin appearance had no abnormalities noted for color. The periwound skin appearance exhibited: Maceration. The periwound skin appearance did not exhibit: Dry/Scaly. Periwound temperature was noted as No Abnormality. Wound #56 status is Open. Original cause of wound was Gradually Appeared. The date acquired was: 07/11/2021. The wound has been in treatment 118 weeks. The wound is located on the Left,Dorsal Foot. The wound measures 1.5cm length  x 2.5cm width x 0.1cm depth; 2.945cm^2 area and 0.295cm^3 volume. There is Fat Layer (Subcutaneous Tissue) exposed. There is no tunneling or undermining noted. There is [name] medium amount of serous drainage noted. The wound margin is flat and intact. There is small (1-33%) pink granulation within the wound bed. There is [name] large (67-100%) amount of necrotic tissue within the wound bed including Adherent Slough. The periwound skin appearance exhibited: Excoriation, Maceration, Rubor. The periwound skin appearance did not exhibit: Dry/Scaly, Erythema. Periwound temperature was noted as No Abnormality. Wound #67 status is Open. Original cause of wound was Gradually Appeared. The date acquired was: 06/22/2022. The wound has been in treatment 69 weeks. The wound is located on the Left,Lateral Lower Leg. The wound measures 2cm length x 0.9cm width x 0.1cm depth; 1.414cm^2  area and 0.141cm^3 volume. There is Fat Layer (Subcutaneous Tissue) exposed. There is no tunneling or undermining noted. There is [name] medium amount of serous drainage noted. The wound margin is distinct with the outline attached to the wound base. There is small (1-33%) red granulation within the wound bed. There is [name] large (67-100%) amount of necrotic tissue within the wound bed including Adherent Slough. The periwound skin appearance had no abnormalities noted for color. The periwound skin appearance exhibited: Scarring, Dry/Scaly. Periwound temperature was noted as No Abnormality. Wound #74 status is Open. Original cause of wound was Pressure Injury. The date acquired was: 01/10/2023. The wound has been in treatment 40 weeks. The wound is located on the Left,Posterior Upper Leg. The wound measures 11.3cm length x 6.6cm width x 0.1cm depth; 58.575cm^2 area and 5.857cm^3 volume. There is Fat Layer (Subcutaneous Tissue) exposed. There is no tunneling or undermining noted. There is [name] large amount of purulent drainage noted. Foul odor after cleansing was noted. The wound margin is flat and intact. There is large (67-100%) red, friable, hyper - granulation within the wound bed. There is [name] small (1-33%) amount of necrotic tissue within the wound bed including Adherent Slough. The periwound skin appearance had no abnormalities noted for moisture. The periwound skin appearance had no abnormalities noted for color. The periwound skin appearance exhibited: Scarring. Periwound temperature was noted as No Abnormality. Wound #78 status is Open. Original cause of wound was Gradually Appeared. The date acquired was: 07/04/2023. The wound has been in treatment 15 weeks. The wound is located on the Right,Anterior Ankle. The wound measures 0.4cm length x 0.4cm width x 0.1cm depth; 0.126cm^2 area and 0.013cm^3 volume. There is Fat Layer (Subcutaneous Tissue) exposed. There is no tunneling or undermining noted. There is [name]  small amount of serosanguineous drainage noted. The wound margin is flat and intact. There is large (67-100%) red granulation within the wound bed. There is [name] small (1-33%) amount of necrotic tissue within the wound bed including Eschar. The periwound skin appearance had no abnormalities noted for moisture. The periwound skin appearance had no abnormalities noted for color. The periwound skin appearance exhibited: Scarring. Periwound temperature was noted as No Abnormality. Wound #81 status is Open. Original cause of wound was Gradually Appeared. The date acquired was: 09/23/2023. The wound has been in treatment 4 weeks. The wound is located on the Right,Plantar Foot. The wound measures 2cm length x 4cm width x 0.1cm depth; 6.283cm^2 area and 0.628cm^3 volume. There is Fat Layer (Subcutaneous Tissue) exposed. There is no tunneling or undermining noted. There is [name] medium amount of serous drainage noted. Foul odor after cleansing was noted. The wound margin is flat and intact. There  is large (67-100%) pink granulation within the wound bed. There is [name] small (1-33%) amount of necrotic tissue within the wound bed including Adherent Slough. The periwound skin appearance had no abnormalities noted for texture. The periwound skin appearance had no abnormalities noted for color. The periwound skin appearance exhibited: Maceration. 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 There was [name] Excisional Skin/Subcutaneous Tissue Debridement with [name] total area of 1.41 sq cm  performed by Maxwell Caul., MD. With the following instrument(s): Curette to remove Viable and Non-Viable tissue/material. Material removed includes Subcutaneous Tissue and Slough and after achieving pain control using Lidocaine 4% T opical Solution. No specimens were taken. [name] time out was conducted at 09:00, prior to the start of the procedure. [name] Minimum amount of bleeding was controlled with Silver Nitrate. The procedure was tolerated well with [name] pain level of Insensate throughout and [name] pain level of Insensate following the procedure. Post Debridement Measurements: 2cm length x 0.9cm width x 0.1cm depth; 0.141cm^3 volume. Character of Wound/Ulcer Post Debridement is improved. Severity of Tissue Post Debridement is: Fat layer exposed. Post procedure Diagnosis Wound #67: Same as Pre-Procedure Wound #74 Pre-procedure diagnosis of Wound #74 is [name] Pressure Ulcer located on the Left,Posterior Upper Leg . There was [name] Excisional Skin/Subcutaneous Tissue Debridement with [name] total area of 29.27 sq cm performed by Maxwell Caul., MD. With the following instrument(s): Curette to remove Viable and Non- Viable tissue/material. Material removed includes Subcutaneous Tissue and Slough and after achieving pain control using Lidocaine 4% Topical Solution. 1 Slavens, Broadus [name] ([id number]) [id number] Page 33 of 35 specimen was taken by [name] Tissue Culture and sent to the lab per facility protocol. [name] time out was conducted at 09:00, prior to the start of the procedure. [name] Minimum amount of bleeding was controlled with Silver Nitrate. The procedure was tolerated well with [name] pain level of Insensate throughout and [name] pain level of Insensate following the procedure. Post Debridement Measurements: 11.3cm length x 6.6cm width x 0.1cm depth; 5.857cm^3 volume. Post debridement Stage noted as Category/Stage III. Character of Wound/Ulcer Post Debridement is improved. Post procedure Diagnosis Wound #74:  Same as Pre-Procedure Plan Follow-up Appointments: Return Appointment in 2 weeks. - Dr. Lady Gary ***allow extra time*** Anesthetic: Wound #52 Right,Dorsal Foot: (In clinic) Topical Lidocaine 4% applied to wound bed Bathing/ Shower/ Hygiene: May shower and wash wound with soap and water. - on days that dressing is changed Off-Loading: Roho cushion for wheelchair - use newer cushion Turn and reposition every 2 hours - lift up with arms in wheelchair every hour during the day Laboratory ordered were: Anaerobic culture The following medication(s) was prescribed: mupirocin topical 2 % ointment ointment topical once daily with dressing changes starting 10/25/2023 WOUND #52: - Foot Wound Laterality: Dorsal, Right Cleanser: Soap and Water Every Other Day/30 Days Discharge Instructions: May shower and wash wound with dial antibacterial soap and water prior to dressing change. Cleanser: Wound Cleanser Every Other Day/30 Days Discharge Instructions: Cleanse the wound with wound cleanser prior to applying [name] clean dressing using gauze sponges, not tissue or cotton balls. Peri-Wound  Care: Sween Lotion (Moisturizing lotion) Every Other Day/30 Days Discharge Instructions: Apply Aquaphor moisturizing lotion as directed Topical: Mupirocin Ointment Every Other Day/30 Days Discharge Instructions: Apply Mupirocin (Bactroban) as instructed Prim Dressing: Maxorb Extra Ag+ Alginate Dressing, 2x2 (in/in) Every Other Day/30 Days ary Discharge Instructions: Apply to wound bed as instructed Secondary Dressing: Woven Gauze Sponge, Non-Sterile 4x4 in Every Other Day/30 Days Discharge Instructions: Apply over primary dressing as directed. Secured With: [employer], 4.5x3.1 (in/yd) (Generic) Every Other Day/30 Days Discharge Instructions: Secure with Kerlix as directed. Secured With: 62M Medipore H Soft Cloth Surgical T ape, 4 x 10 (in/yd) (Generic) Every Other Day/30 Days Discharge Instructions: Secure with  tape as directed. WOUND #56: - Foot Wound Laterality: Dorsal, Left Cleanser: Soap and Water Every Other Day/30 Days Discharge Instructions: May shower and wash wound with dial antibacterial soap and water prior to dressing change. Cleanser: Wound Cleanser Every Other Day/30 Days Discharge Instructions: Cleanse the wound with wound cleanser prior to applying [name] clean dressing using gauze sponges, not tissue or cotton balls. Peri-Wound Care: Sween Lotion (Moisturizing lotion) Every Other Day/30 Days Discharge Instructions: Apply Aquaphor moisturizing lotion as directed Topical: Mupirocin Ointment Every Other Day/30 Days Discharge Instructions: Apply Mupirocin (Bactroban) as instructed Prim Dressing: Maxorb Extra Ag+ Alginate Dressing, 2x2 (in/in) Every Other Day/30 Days ary Discharge Instructions: Apply to wound bed as instructed Secondary Dressing: Woven Gauze Sponge, Non-Sterile 4x4 in Every Other Day/30 Days Discharge Instructions: Apply over primary dressing as directed. Secured With: [employer], 4.5x3.1 (in/yd) (Generic) Every Other Day/30 Days Discharge Instructions: Secure with Kerlix as directed. Secured With: 62M Medipore H Soft Cloth Surgical T ape, 4 x 10 (in/yd) (Generic) Every Other Day/30 Days Discharge Instructions: Secure with tape as directed. WOUND #67: - Lower Leg Wound Laterality: Left, Lateral Cleanser: Soap and Water Every Other Day/30 Days Discharge Instructions: May shower and wash wound with dial antibacterial soap and water prior to dressing change. Cleanser: Wound Cleanser Every Other Day/30 Days Discharge Instructions: Cleanse the wound with wound cleanser prior to applying [name] clean dressing using gauze sponges, not tissue or cotton balls. Peri-Wound Care: Sween Lotion (Moisturizing lotion) Every Other Day/30 Days Discharge Instructions: Apply Aquaphor moisturizing lotion as directed Topical: Mupirocin Ointment Every Other Day/30 Days Discharge Instructions:  Apply Mupirocin (Bactroban) as instructed Prim Dressing: Maxorb Extra Ag+ Alginate Dressing, 2x2 (in/in) Every Other Day/30 Days ary Discharge Instructions: Apply to wound bed as instructed Secondary Dressing: Woven Gauze Sponge, Non-Sterile 4x4 in Every Other Day/30 Days Discharge Instructions: Apply over primary dressing as directed. Secured With: [employer], 4.5x3.1 (in/yd) (Generic) Every Other Day/30 Days Discharge Instructions: Secure with Kerlix as directed. Secured With: 62M Medipore H Soft Cloth Surgical T ape, 4 x 10 (in/yd) (Generic) Every Other Day/30 Days Discharge Instructions: Secure with tape as directed. WOUND #74: - Upper Leg Wound Laterality: Left, Posterior Cleanser: Vashe 5.8 (oz) 1 x Per Day/30 Days Discharge Instructions: Cleanse the wound with Vashe prior to applying [name] clean dressing using gauze sponges , let sit on wound for 10 minutes Peri-Wound Care: Zinc Oxide Ointment 30g tube 1 x Per Day/30 Days Discharge Instructions: Apply Zinc Oxide to periwound with each dressing change as needed fpr moisture Topical: Gentamicin 1 x Per Day/30 Days Discharge Instructions: in clinic if [facility] not available Topical: [place]stone antibiotic compound 1 x Per Day/30 Days Prim Dressing: Maxorb Extra Ag+ Alginate Dressing, 4x4.75 (in/in) 1 x Per Day/30 Days ary Discharge Instructions: Apply to wound bed as instructed Secondary  Dressing: Zetuvit Plus Silicone Border Sacrum Dressing, Sm, 7x7 (in/in) 1 x Per Day/30 Days Discharge Instructions: Apply silicone border over primary dressing as directed. Muench, TRAVONN ALEXIS ([id number]) [id number] Page 34 of 35 Secured With: 640M Medipore H Soft Cloth Surgical T ape, 4 x 10 (in/yd) 1 x Per Day/30 Days Discharge Instructions: Secure with tape as directed. WOUND #78: - Ankle Wound Laterality: Right, Anterior Cleanser: Soap and Water Every Other Day/30 Days Discharge Instructions: May shower and wash wound with  dial antibacterial soap and water prior to dressing change. Cleanser: Wound Cleanser Every Other Day/30 Days Discharge Instructions: Cleanse the wound with wound cleanser prior to applying [name] clean dressing using gauze sponges, not tissue or cotton balls. Peri-Wound Care: Sween Lotion (Moisturizing lotion) Every Other Day/30 Days Discharge Instructions: Apply Aquaphor moisturizing lotion as directed Topical: Mupirocin Ointment Every Other Day/30 Days Discharge Instructions: Apply Mupirocin (Bactroban) as instructed Prim Dressing: Maxorb Extra Ag+ Alginate Dressing, 2x2 (in/in) Every Other Day/30 Days ary Discharge Instructions: Apply to wound bed as instructed Secondary Dressing: Woven Gauze Sponge, Non-Sterile 4x4 in Every Other Day/30 Days Discharge Instructions: Apply over primary dressing as directed. Secured With: [employer], 4.5x3.1 (in/yd) (Generic) Every Other Day/30 Days Discharge Instructions: Secure with Kerlix as directed. Secured With: 640M Medipore H Soft Cloth Surgical T ape, 4 x 10 (in/yd) (Generic) Every Other Day/30 Days Discharge Instructions: Secure with tape as directed. 1. I did not change the primary dressing here. This is mupirocin silver alginate predominantly although I think he is using gentamicin on some of these as well 2. Particularly concerning was the left buttock/posterior leg wound superiorly. Awaiting [name] PCR culture on this area. 3. I have counseled him he is going to need to get out of his wheelchair into bed or [name] couch to take the pressure off this wound on the left. I think this would help his predominantly stasis ulcerations on his legs as well which really have not made considerable improvement in quite [name] while #4 mupirocin renewed Electronic Signature(s) Signed: 10/30/2023 9:47:40 AM By: [name] Najjar MD Entered By: [name] on 10/27/2023 07:40:14 -------------------------------------------------------------------------------- SuperBill  Details Patient Name: Date of Service: Anzaldo, [name] 10/25/2023 Medical Record Number: [id number] Patient Account Number: [id number] Date of Birth/Sex: Treating RN: [birth year] (37 y.o. Damaris Schooner Primary Care Provider: [name], [name] Other Clinician: Referring Provider: Treating Provider/Extender: Marcelline Mates [name]: 407 Diagnosis Coding ICD-10 Codes Code Description [phone] Non-pressure chronic ulcer of other part of left foot with fat layer exposed L97.512 Non-pressure chronic ulcer of other part of right foot with fat layer exposed L89.323 Pressure ulcer of left buttock, stage 3 L97.322 Non-pressure chronic ulcer of left ankle with fat layer exposed G82.21 Paraplegia, complete Facility Procedures : CPT4 Code: [id number] Description: 11042 - DEB SUBQ TISSUE 20 SQ CM/< ICD-10 Diagnosis Description 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, stage 3 Modifier: Quantity: 1 : CPT4 Code: [id number] Description: 11045 - DEB SUBQ TISS EA ADDL 20CM ICD-10 Diagnosis Description L89.323 Pressure ulcer of left buttock, stage 3 L97.322 Non-pressure chronic ulcer of left ankle with fat layer exposed Modifier: Quantity: 1 Physician Procedures : CPT4 Code Description Modifier [id number] 11042 - WC PHYS SUBQ TISS 20 SQ CM Junkins, Canuto [name] ([id number]) [id number] Quantity: 1 Page 35 of 35 : ICD-10 Diagnosis Description 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, stage 3  Quantity: : [id number] 11045 - WC PHYS SUBQ TISS EA ADDL 20 CM 1 ICD-10 Diagnosis Description L89.323 Pressure ulcer of left buttock, stage 3 L97.322 Non-pressure chronic ulcer of left ankle with fat layer exposed Quantity: Electronic Signature(s) Signed: 10/30/2023 9:47:40 AM By: [name] Najjar MD Previous Signature: 10/26/2023 11:53:29 AM  Version By: Zenaida Deed RN, BSN Entered By: [name] on 10/27/2023 07:40:27

## 2023-11-07 ENCOUNTER — Encounter (HOSPITAL_BASED_OUTPATIENT_CLINIC_OR_DEPARTMENT_OTHER): Payer: BC Managed Care – PPO | Admitting: General Surgery

## 2023-11-07 DIAGNOSIS — L97822 Non-pressure chronic ulcer of other part of left lower leg with fat layer exposed: Secondary | ICD-10-CM | POA: Diagnosis not present

## 2023-11-07 DIAGNOSIS — L97512 Non-pressure chronic ulcer of other part of right foot with fat layer exposed: Secondary | ICD-10-CM | POA: Diagnosis not present

## 2023-11-07 DIAGNOSIS — L89323 Pressure ulcer of left buttock, stage 3: Secondary | ICD-10-CM | POA: Diagnosis not present

## 2023-11-07 DIAGNOSIS — G8221 Paraplegia, complete: Secondary | ICD-10-CM | POA: Diagnosis not present

## 2023-11-07 DIAGNOSIS — I872 Venous insufficiency (chronic) (peripheral): Secondary | ICD-10-CM | POA: Diagnosis not present

## 2023-11-07 DIAGNOSIS — Z792 Long term (current) use of antibiotics: Secondary | ICD-10-CM | POA: Diagnosis not present

## 2023-11-07 DIAGNOSIS — L97322 Non-pressure chronic ulcer of left ankle with fat layer exposed: Secondary | ICD-10-CM | POA: Diagnosis not present

## 2023-11-07 DIAGNOSIS — L97422 Non-pressure chronic ulcer of left heel and midfoot with fat layer exposed: Secondary | ICD-10-CM | POA: Diagnosis not present

## 2023-11-07 DIAGNOSIS — L299 Pruritus, unspecified: Secondary | ICD-10-CM | POA: Diagnosis not present

## 2023-11-07 DIAGNOSIS — L97823 Non-pressure chronic ulcer of other part of left lower leg with necrosis of muscle: Secondary | ICD-10-CM | POA: Diagnosis not present

## 2023-11-07 DIAGNOSIS — G6289 Other specified polyneuropathies: Secondary | ICD-10-CM | POA: Diagnosis not present

## 2023-11-07 DIAGNOSIS — L03115 Cellulitis of right lower limb: Secondary | ICD-10-CM | POA: Diagnosis not present

## 2023-11-07 DIAGNOSIS — L97522 Non-pressure chronic ulcer of other part of left foot with fat layer exposed: Secondary | ICD-10-CM | POA: Diagnosis not present

## 2023-11-07 DIAGNOSIS — L97312 Non-pressure chronic ulcer of right ankle with fat layer exposed: Secondary | ICD-10-CM | POA: Diagnosis not present

## 2023-11-08 NOTE — Progress Notes (Signed)
Stange, BROADUS POUND ([id number]) [id number] Page 1 of 18 Visit Report for 11/07/2023 Arrival Information Details Patient Name: Date of Service: Mansel, [name] 11/07/2023 10:00 A M Medical Record Number: [id number] Patient Account Number: [id number] Date of Birth/Sex: Treating RN: [birth year] (37 y.o. M) Primary Care Leyna Vanderkolk: [name], [name] Other Clinician: Referring Rhema Boyett: Treating Presleigh Feldstein/Extender: Fredderick Severance [name]: 409 Visit Information History Since Last Visit Added or deleted any medications: No Patient Arrived: Wheel Chair Any new allergies or adverse reactions: No Arrival Time: 10:23 Had a fall or experienced change in No Accompanied By: self activities of daily living that may affect Transfer Assistance: None risk of falls: Patient Identification Verified: Yes Signs or symptoms of abuse/neglect since last visito No Secondary Verification Process Completed: Yes Hospitalized since last visit: No Patient Requires Transmission-Based Precautions: No Implantable device outside of the clinic excluding No Patient Has Alerts: Yes cellular tissue based products placed in the center Patient Alerts: R ABI = 1.0 since last visit: L ABI = 1.1 Has Dressing in Place as Prescribed: Yes Has Compression in Place as Prescribed: Yes Pain Present Now: No Electronic Signature(s) Signed: 11/07/2023 5:41:44 PM By: Zenaida Deed RN, BSN Entered By: Zenaida Deed on 11/07/2023 10:25:44 -------------------------------------------------------------------------------- Encounter Discharge Information Details Patient Name: Date of Service: Mounts, [name] 11/07/2023 10:00 A M Medical Record Number: [id number] Patient Account Number: [id number] Date of Birth/Sex: Treating RN: 1986/03/23 (37 y.o. Damaris Schooner Primary Care Eleena Grater: [name], [name] Other Clinician: Referring Saharra Santo: Treating Matilda Fleig/Extender: Fredderick Severance [name]: [phone] Encounter Discharge Information Items Post Procedure Vitals Discharge Condition: Stable Temperature (F): 98.3 Ambulatory Status: Wheelchair Pulse (bpm): 130 Discharge Destination: Home Respiratory Rate (breaths/min): 18 Transportation: Private Auto Blood Pressure (mmHg): 129/71 Accompanied By: self Schedule Follow-up Appointment: No Clinical Summary of Care: Electronic Signature(s) Signed: 11/07/2023 5:41:44 PM By: Zenaida Deed RN, BSN Entered By: Zenaida Deed on 11/07/2023 12:01:02 Ehresman, [name] ([id number]) [id number] Page 2 of 18 -------------------------------------------------------------------------------- Lower Extremity Assessment Details Patient Name: Date of Service: Wherry, [name] 11/07/2023 10:00 A M Medical Record Number: [id number] Patient Account Number: [id number] Date of Birth/Sex: Treating RN: 1986/08/05 (37 y.o. Damaris Schooner Primary Care Shell Blanchette: [name], [name] Other Clinician: Referring Quanisha Drewry: Treating Brooklen Runquist/Extender: Benjamine Mola, [name]: 409 Edema Assessment Assessed: [Left: No] [Right: No] Edema: [Left: Yes] [Right: Yes] Calf Left: Right: Point of Measurement: 33 cm From Medial Instep 30.5 cm 35 cm Ankle Left: Right: Point of Measurement: 10 cm From Medial Instep 25 cm 26.5 cm Vascular Assessment Pulses: Dorsalis Pedis Palpable: [Left:Yes] [Right:Yes] Extremity colors, hair growth, and conditions: Extremity Color: [Left:Normal] [Right:Normal] Hair Growth on Extremity: [Left:Yes] [Right:Yes] Temperature of Extremity: [Left:Warm < 3 seconds] [Right:Warm < 3 seconds] Electronic Signature(s) Signed: 11/07/2023 5:41:44 PM By: Zenaida Deed RN, BSN Entered By: Zenaida Deed on 11/07/2023 10:45:09 -------------------------------------------------------------------------------- Multi Wound Chart Details Patient Name: Date of Service: Hett, A  [name] 11/07/2023 10:00 A M Medical Record Number: [id number] Patient Account Number: [id number] Date of Birth/Sex: Treating RN: [birth year] (37 y.o. M) Primary Care Azalya Galyon: [name], [name] Other Clinician: Referring Quandarius Nill: Treating Ladina Shutters/Extender: Benjamine Mola, [name]: 409 Vital Signs Height(in): 70 Pulse(bpm): 130 Weight(lbs): 216 Blood Pressure(mmHg): 129/71 Body Mass Index(BMI): 31 Temperature(F): 98.3 Respiratory Rate(breaths/min): 18 [52:Photos: No Photos Right, Dorsal Foot Wound Location: Gradually Appeared Wounding Event: Venous Leg Ulcer Primary Etiology:] [56:No Photos Left, Dorsal Foot Gradually Appeared Neuropathic Ulcer-Non Diabetic] [67:No Photos Left, Lateral Lower Leg Gradually  Appeared Venous Leg Ulcer] Mahurin, Mohd E ([id number]) [52:Sleep Apnea, Hypertension, Paraplegia Sleep Apnea, Hypertension, Paraplegia Sleep Apnea, Hypertension, Paraplegia Comorbid History: 03/27/2021 Date Acquired: 136 Weeks of Treatment: Open Wound Status: No Wound Recurrence: No Clustered Wound: N/A  Clustered Quantity: 4.5x8.2x0.1 Measurements L x W x D (cm) 28.981 A (cm) : rea 2.898 Volume (cm) : -1437.50% % Reduction in Area: -1441.50% % Reduction in Volume: Full Thickness Without Exposed Classification: Support Structures Large Exudate A mount:  Serous Exudate Type: amber Exudate Color: Distinct, outline attached Wound Margin: Medium (34-66%) Granulation A mount: Red, Pink Granulation Quality: Medium (34-66%) Necrotic A mount: Adherent Slough Necrotic Tissue: Fat Layer (Subcutaneous Tissue): Yes  Fat Layer (Subcutaneous Tissue): Yes Fat Layer (Subcutaneous Tissue): Yes Exposed Structures: Fascia: No Tendon: No Muscle: No Joint: No Bone: No Small (1-33%) Epithelialization: Debridement - Excisional Debridement: Pre-procedure Verification/Time Out  11:05 Taken: Lidocaine 4% Topical Solution Pain Control: Subcutaneous, Slough Tissue Debrided: Skin/Subcutaneous Tissue  Level: 28.97 Debridement A (sq cm): rea Curette Instrument: Minimum Bleeding: Pressure Hemostasis A chieved: Insensate Procedural  Pain: Insensate Post Procedural Pain: Procedure was tolerated well Debridement Treatment Response: 4.5x8.2x0.1 Post Debridement Measurements L x W x D (cm) 2.898 Post Debridement Volume: (cm) N/A Post Debridement Stage: No Abnormalities Noted Periwound  Skin Texture: Maceration: Yes Periwound Skin Moisture: Dry/Scaly: No Rubor: No Periwound Skin Color: No Abnormality Temperature: Debridement Procedures Performed:] [56:07/11/2021 119 Open No Yes N/A 4.9x6x0.1 23.091 2.309 -271.20% -271.20% Full Thickness  Without Exposed Support Structures Large Serous amber Flat and Intact Small (1-33%) Pink Large (67-100%) Adherent Slough Fascia: No Tendon: No Muscle: No Joint: No Bone: No Small (1-33%) Debridement - Excisional 11:05 Lidocaine 4% Topical Solution  Subcutaneous, Slough Skin/Subcutaneous Tissue 23.08 Curette Minimum Pressure Insensate Insensate Procedure was tolerated well 4.9x6x0.1 2.309 N/A Excoriation: Yes Maceration: Yes Dry/Scaly: No Rubor: Yes Erythema: No No Abnormality Debridement]  [id number] Page 3 of 18 06/22/2022 70 Open No No N/A 2.2x1x0.1 1.728 0.173 69.20% 69.20% Full Thickness Without Exposed Support Structures Medium Serous amber Distinct, outline attached None Present (0%) N/A Large (67-100%)  Adherent Slough Fascia: No Tendon: No Muscle: No Joint: No Bone: No Small (1-33%) Debridement - Excisional 11:05 Lidocaine 4% Topical Solution Muscle, Subcutaneous, Slough Skin/Subcutaneous Tissue/Muscle 1.73 Curette Minimum Pressure Insensate Insensate  Procedure was tolerated well 2.2x1x0.1 0.173 N/A Scarring: Yes Dry/Scaly: Yes No Abnormality Debridement] Wound Number: [id number] Photos: No Photos No Photos No Photos Left, Posterior Upper Leg Right, Anterior Ankle Right, Plantar Foot Wound Location: Pressure Injury Gradually Appeared  Gradually Appeared Wounding Event: Pressure Ulcer Venous Leg Ulcer Lymphedema Primary Etiology: Sleep Apnea, Hypertension, Paraplegia Sleep Apnea, Hypertension, Paraplegia Sleep Apnea, Hypertension, Paraplegia Comorbid History: 01/10/2023 07/04/2023 09/23/2023 Date Acquired: 41 16 6 Weeks of Treatment: Open Open Open Wound Status: No No No Wound Recurrence: Yes No No Clustered Wound: 2 N/A N/A Clustered Quantity: 12.4x5.2x0.1 0.5x0.5x0.1 1.5x3.8x0.1 Measurements L x W x D (cm) 50.642 0.196 4.477 A (cm) : rea 5.064 0.02 0.448 Volume (cm) : 32.10% 89.10% -714.00% % Reduction in A rea: 32.10% 89.00% -714.50% % Reduction in Volume: Category/Stage III Full Thickness Without Exposed Full Thickness Without Exposed Classification: Support Structures Support Structures Medium Small Medium Exudate A mount: Serosanguineous Serosanguineous Serous Exudate Type: red, brown red, brown amber Exudate Color: Flat and Intact Flat and Intact Flat and Intact Wound Margin: Large (67-100%) Large (67-100%) Small (1-33%) Granulation Amount: Red, Friable Red Pink Granulation Quality: Small (1-33%) None Present (0%) Large (67-100%) Necrotic Amount: Adherent Slough N/A Adherent [employer]  Necrotic Tissue: Fat Layer (Subcutaneous Tissue): Yes Fat Layer (Subcutaneous Tissue): Yes Fat Layer (Subcutaneous Tissue): Yes Exposed Structures: Fascia: No Fascia: No Fascia: No Tendon: No Tendon: No Tendon: No Muscle: No Muscle: No Muscle: No Joint: No Joint: No Joint: No Chancellor, Corin E ([id number]) [id number] Page 4 of 18 Bone: No Bone: No Bone: No Medium (34-66%) Medium (34-66%) Medium (34-66%) Epithelialization: Debridement - Excisional N/A Debridement - Excisional Debridement: 11:05 N/A 11:05 Pre-procedure Verification/Time Out Taken: Lidocaine 4% Topical Solution N/A Lidocaine 4% Topical Solution Pain Control: Subcutaneous, Slough N/A Subcutaneous,  Slough Tissue Debrided: Skin/Subcutaneous Tissue N/A Skin/Subcutaneous Tissue Level: 25.31 N/A 4.47 Debridement A (sq cm): rea Curette N/A Curette Instrument: Minimum N/A Minimum Bleeding: Pressure N/A Pressure Hemostasis A chieved: Insensate N/A Insensate Procedural Pain: Insensate N/A Insensate Post Procedural Pain: Procedure was tolerated well N/A Procedure was tolerated well Debridement Treatment Response: 12.4x5.2x0.1 N/A 1.5x3.8x0.1 Post Debridement Measurements L x W x D (cm) 5.064 N/A 0.448 Post Debridement Volume: (cm) Category/Stage III N/A N/A Post Debridement Stage: Scarring: Yes Scarring: Yes No Abnormalities Noted Periwound Skin Texture: Dry/Scaly: Yes No Abnormalities Noted Maceration: Yes Periwound Skin Moisture: Ecchymosis: Yes No Abnormalities Noted No Abnormalities Noted Periwound Skin Color: No Abnormality No Abnormality No Abnormality Temperature: Debridement N/A Debridement Procedures Performed: Wound Number: 82 N/A N/A Photos: No Photos N/A N/A Left, Proximal, Lateral Lower Leg N/A N/A Wound Location: Pressure Injury N/A N/A Wounding Event: Pressure Ulcer N/A N/A Primary Etiology: Sleep Apnea, Hypertension, Paraplegia N/A N/A Comorbid History: 11/04/2023 N/A N/A Date Acquired: 0 N/A N/A Weeks of Treatment: Open N/A N/A Wound Status: No N/A N/A Wound Recurrence: No N/A N/A Clustered Wound: N/A N/A N/A Clustered Quantity: 1.4x2.7x0.1 N/A N/A Measurements L x W x D (cm) 2.969 N/A N/A A (cm) : rea 0.297 N/A N/A Volume (cm) : N/A N/A N/A % Reduction in A rea: N/A N/A N/A % Reduction in Volume: Unstageable/Unclassified N/A N/A Classification: Medium N/A N/A Exudate A mount: Serosanguineous N/A N/A Exudate Type: red, brown N/A N/A Exudate Color: Indistinct, nonvisible N/A N/A Wound Margin: None Present (0%) N/A N/A Granulation A mount: N/A N/A N/A Granulation Quality: Large (67-100%) N/A N/A Necrotic A  mount: Eschar, Adherent Slough N/A N/A Necrotic Tissue: Fat Layer (Subcutaneous Tissue): Yes N/A N/A Exposed Structures: Fascia: No Tendon: No Muscle: No Joint: No Bone: No Small (1-33%) N/A N/A Epithelialization: Debridement - Excisional N/A N/A Debridement: Pre-procedure Verification/Time Out 11:05 N/A N/A Taken: Lidocaine 4% Topical Solution N/A N/A Pain Control: Subcutaneous, Slough N/A N/A Tissue Debrided: Skin/Subcutaneous Tissue N/A N/A Level: 2.97 N/A N/A Debridement A (sq cm): rea Curette N/A N/A Instrument: Minimum N/A N/A Bleeding: Pressure N/A N/A Hemostasis A chieved: Insensate N/A N/A Procedural Pain: Insensate N/A N/A Post Procedural Pain: Procedure was tolerated well N/A N/A Debridement Treatment Response: 1.4x2.7x0.1 N/A N/A Post Debridement Measurements L x W x D (cm) 0.297 N/A N/A Post Debridement Volume: (cm) Unstageable/Unclassified N/A N/A Post Debridement Stage: No Abnormalities Noted N/A N/A Periwound Skin Texture: No Abnormalities Noted N/A N/A Periwound Skin Moisture: No Abnormalities Noted N/A N/A Periwound Skin Color: No Abnormality N/A N/A Temperature: Debridement N/A N/A Procedures Performed: Treatment Notes Detzel, Kyzen E ([id number]) [id number] Page 5 of 18 Electronic Signature(s) Signed: 11/07/2023 12:43:55 PM By: Duanne Guess MD FACS Entered By: Duanne Guess on 11/07/2023 11:50:48 -------------------------------------------------------------------------------- Multi-Disciplinary Care Plan Details Patient Name: Date of Service: Pitner, [name] 11/07/2023 10:00 A M Medical Record Number: [id number] Patient Account Number: [id number] Date of Birth/Sex: Treating RN: 1986-03-25 (  37 y.o. Damaris Schooner Primary Care Enos Muhl: [name], [name] Other Clinician: Referring Belladonna Lubinski: Treating Gratia Disla/Extender: Fredderick Severance [name]: 409 Multidisciplinary Care Plan  reviewed with physician Active Inactive Pressure Nursing Diagnoses: Knowledge deficit related to management of pressures ulcers Potential for impaired tissue integrity related to pressure, friction, moisture, and shear Goals: Patient will remain free from development of additional pressure ulcers Date Initiated: 06/21/2018 Date Inactivated: 02/12/2019 Target Resolution Date: 02/16/2019 Unmet Reason: pressure ulcer Goal Status: Unmet reopened on left ischium Patient/caregiver will verbalize risk factors for pressure ulcer development Date Initiated: 06/21/2018 Date Inactivated: 05/28/2019 Target Resolution Date: 06/01/2019 Goal Status: Met Patient/caregiver will verbalize understanding of pressure ulcer management Date Initiated: 06/01/2022 Target Resolution Date: 11/18/2023 Goal Status: Active Interventions: Assess: immobility, friction, shearing, incontinence upon admission and as needed Assess offloading mechanisms upon admission and as needed Assess potential for pressure ulcer upon admission and as needed Treatment Activities: Pressure reduction/relief device ordered : 12/05/2017 Notes: Venous Leg Ulcer Nursing Diagnoses: Knowledge deficit related to disease process and management Potential for venous Insuffiency (use before diagnosis confirmed) Goals: Non-invasive venous studies are completed as ordered Date Initiated: 01/05/2016 Date Inactivated: 09/15/2017 Target Resolution Date: 01/20/2017 Goal Status: Met Patient will maintain optimal edema control Date Initiated: 01/05/2016 Target Resolution Date: 11/18/2023 Goal Status: Active Patient/caregiver will verbalize understanding of disease process and disease management Date Initiated: 01/05/2016 Date Inactivated: 09/15/2017 Target Resolution Date: 01/20/2017 Goal Status: Met Verify adequate tissue perfusion prior to therapeutic compression application Date Initiated: 01/05/2016 Date Inactivated: 09/15/2017 Target Resolution  Date: 01/20/2017 Goal Status: Met Interventions: Assess peripheral edema status every visit. Garms, CLETE SENESAC ([id number]) [id number] Page 6 of 18 Compression as ordered Provide education on venous insufficiency Treatment Activities: Non-invasive vascular studies : 03/11/2016 Therapeutic compression applied : 03/11/2016 Notes: Wound/Skin Impairment Nursing Diagnoses: Impaired tissue integrity Knowledge deficit related to ulceration/compromised skin integrity Goals: Patient/caregiver will verbalize understanding of skin care regimen Date Initiated: 06/01/2022 Target Resolution Date: 11/18/2023 Goal Status: Active Ulcer/skin breakdown will have a volume reduction of 30% by week 4 Date Initiated: 06/01/2022 Date Inactivated: 06/29/2022 Target Resolution Date: 06/29/2022 Unmet Reason: complex wounds, Goal Status: Unmet infection Interventions: Assess patient/caregiver ability to obtain necessary supplies Assess ulceration(s) every visit Provide education on ulcer and skin care Notes: 02/02/21: Complex Care, ongoing. Electronic Signature(s) Signed: 11/07/2023 5:41:44 PM By: Zenaida Deed RN, BSN Entered By: Zenaida Deed on 11/07/2023 11:08:29 -------------------------------------------------------------------------------- Pain Assessment Details Patient Name: Date of Service: Fuentes, [name] 11/07/2023 10:00 A M Medical Record Number: [id number] Patient Account Number: [id number] Date of Birth/Sex: Treating RN: 09/02/1986 (37 y.o. M) Primary Care Almendra Loria: [name], [name] Other Clinician: Referring Erik Nessel: Treating Emmamae Mcnamara/Extender: Fredderick Severance [name]: [phone] Active Problems Location of Pain Severity and Description of Pain Patient Has Paino No Site Locations Rate the pain. Current Pain Level: 0 Fennewald, Gumecindo E ([id number]) [id number] Page 7 of 18 Pain Management and Medication Current Pain  Management: Electronic Signature(s) Signed: 11/07/2023 5:41:44 PM By: Zenaida Deed RN, BSN Entered By: Zenaida Deed on 11/07/2023 10:26:01 -------------------------------------------------------------------------------- Patient/Caregiver Education Details Patient Name: Date of Service: Lengel, A Ronney Asters 12/16/2024andnbsp10:00 A M Medical Record Number: [id number] Patient Account Number: [id number] Date of Birth/Gender: Treating RN: September 04, 1986 (37 y.o. Damaris Schooner Primary Care Physician: Eunice Blase Other Clinician: Referring Physician: Treating Physician/Extender: Fredderick Severance [name]: [phone] Education Assessment Education Provided To: Patient Education Topics Provided Infection: Methods: Explain/Verbal Responses: Reinforcements needed, State content correctly Venous: Methods: Explain/Verbal Responses: Reinforcements  needed, State content correctly Wound/Skin Impairment: Methods: Explain/Verbal Responses: Reinforcements needed, State content correctly Electronic Signature(s) Signed: 11/07/2023 5:41:44 PM By: Zenaida Deed RN, BSN Entered By: Zenaida Deed on 11/07/2023 11:09:26 -------------------------------------------------------------------------------- Wound Assessment Details Patient Name: Date of Service: Miyasato, [name] 11/07/2023 10:00 A M Medical Record Number: [id number] Patient Account Number: [id number] Date of Birth/Sex: Treating RN: 04-Nov-1986 (37 y.o. Damaris Schooner Primary Care Dehlia Kilner: [name], [name] Other Clinician: Referring Michaiah Maiden: Treating Demiah Gullickson/Extender: Benjamine Mola, [name]: 409 Wound Status Wound Number: [id number] Primary Etiology: Venous Leg Ulcer Wound Location: Right, Dorsal Foot Wound Status: Open Wounding Event: Gradually Appeared Comorbid History: Sleep Apnea, Hypertension, Paraplegia Date Acquired: 03/27/2021 Weeks Of Treatment: 136 Clustered Wound: No Coover, Jaycen  E ([id number]) [id number] Page 8 of 18 Photos Wound Measurements Length: (cm) 4.5 Width: (cm) 8.2 Depth: (cm) 0.1 Area: (cm) 28.981 Volume: (cm) 2.898 % Reduction in Area: -1437.5% % Reduction in Volume: -1441.5% Epithelialization: Small (1-33%) Tunneling: No Undermining: No Wound Description Classification: Full Thickness Without Exposed Support Structures Wound Margin: Distinct, outline attached Exudate Amount: Large Exudate Type: Serous Exudate Color: amber Foul Odor After Cleansing: No Slough/Fibrino Yes Wound Bed Granulation Amount: Medium (34-66%) Exposed Structure Granulation Quality: Red, Pink Fascia Exposed: No Necrotic Amount: Medium (34-66%) Fat Layer (Subcutaneous Tissue) Exposed: Yes Necrotic Quality: Adherent Slough Tendon Exposed: No Muscle Exposed: No Joint Exposed: No Bone Exposed: No Periwound Skin Texture Texture Color No Abnormalities Noted: Yes No Abnormalities Noted: Yes Moisture Temperature / Pain No Abnormalities Noted: No Temperature: No Abnormality Dry / Scaly: No Maceration: Yes Treatment Notes Wound #52 (Foot) Wound Laterality: Dorsal, Right Cleanser Soap and Water Discharge Instruction: May shower and wash wound with dial antibacterial soap and water prior to dressing change. Wound Cleanser Discharge Instruction: Cleanse the wound with wound cleanser prior to applying a clean dressing using gauze sponges, not tissue or cotton balls. Peri-Wound Care Sween Lotion (Moisturizing lotion) Discharge Instruction: Apply Aquaphor moisturizing lotion as directed Topical Mupirocin Ointment Discharge Instruction: Apply Mupirocin (Bactroban) as instructed Primary Dressing Maxorb Extra Ag+ Alginate Dressing, 2x2 (in/in) Discharge Instruction: Apply to wound bed as instructed Secondary Dressing Woven Gauze Sponge, Non-Sterile 4x4 in Discharge Instruction: Apply over primary dressing as directed. Burkholder, JALEAL VANDEBERG  ([id number]) [id number] Page 9 of 18 Secured With [employer], 4.5x3.1 (in/yd) Discharge Instruction: Secure with Kerlix as directed. 38M Medipore H Soft Cloth Surgical T ape, 4 x 10 (in/yd) Discharge Instruction: Secure with tape as directed. Compression Wrap Compression Stockings Add-Ons Electronic Signature(s) Signed: 11/07/2023 5:41:44 PM By: Zenaida Deed RN, BSN Entered By: Zenaida Deed on 11/07/2023 11:53:51 -------------------------------------------------------------------------------- Wound Assessment Details Patient Name: Date of Service: Uno, [name] 11/07/2023 10:00 A M Medical Record Number: [id number] Patient Account Number: [id number] Date of Birth/Sex: Treating RN: Apr 15, 1986 (37 y.o. Damaris Schooner Primary Care Ilamae Geng: [name], [name] Other Clinician: Referring Annarose Ouellet: Treating Yehoshua Vitelli/Extender: Benjamine Mola, [name]: 409 Wound Status Wound Number: 56 Primary Etiology: Neuropathic Ulcer-Non Diabetic Wound Location: Left, Dorsal Foot Wound Status: Open Wounding Event: Gradually Appeared Comorbid History: Sleep Apnea, Hypertension, Paraplegia Date Acquired: 07/11/2021 Weeks Of Treatment: 119 Clustered Wound: Yes Photos Wound Measurements Length: (cm) 4.9 Width: (cm) 6 Depth: (cm) 0.1 Area: (cm) 23.091 Volume: (cm) 2.309 % Reduction in Area: -271.2% % Reduction in Volume: -271.2% Epithelialization: Small (1-33%) Tunneling: No Undermining: No Wound Description Classification: Full Thickness Without Exposed Support Structures Wound Margin: Flat and Intact Exudate Amount: Large Exudate Type: Serous Exudate Color: amber Foul  Odor After Cleansing: No Slough/Fibrino Yes Wound Bed Granulation Amount: Small (1-33%) Exposed Structure Granulation Quality: Pink Fascia Exposed: No Necrotic Amount: Large (67-100%) Fat Layer (Subcutaneous Tissue) Exposed: Yes Necrotic Quality:  Adherent Slough Tendon Exposed: No Klugh, Osher E ([id number]) [id number] Page 10 of 18 Muscle Exposed: No Joint Exposed: No Bone Exposed: No Periwound Skin Texture Texture Color No Abnormalities Noted: No No Abnormalities Noted: No Excoriation: Yes Erythema: No Rubor: Yes Moisture No Abnormalities Noted: No Temperature / Pain Dry / Scaly: No Temperature: No Abnormality Maceration: Yes Treatment Notes Wound #56 (Foot) Wound Laterality: Dorsal, Left Cleanser Soap and Water Discharge Instruction: May shower and wash wound with dial antibacterial soap and water prior to dressing change. Wound Cleanser Discharge Instruction: Cleanse the wound with wound cleanser prior to applying a clean dressing using gauze sponges, not tissue or cotton balls. Peri-Wound Care Sween Lotion (Moisturizing lotion) Discharge Instruction: Apply Aquaphor moisturizing lotion as directed Topical Mupirocin Ointment Discharge Instruction: Apply Mupirocin (Bactroban) as instructed Primary Dressing Maxorb Extra Ag+ Alginate Dressing, 2x2 (in/in) Discharge Instruction: Apply to wound bed as instructed Secondary Dressing Woven Gauze Sponge, Non-Sterile 4x4 in Discharge Instruction: Apply over primary dressing as directed. Secured With [employer], 4.5x3.1 (in/yd) Discharge Instruction: Secure with Kerlix as directed. 87M Medipore H Soft Cloth Surgical T ape, 4 x 10 (in/yd) Discharge Instruction: Secure with tape as directed. Compression Wrap Compression Stockings Add-Ons Electronic Signature(s) Signed: 11/07/2023 5:41:44 PM By: Zenaida Deed RN, BSN Entered By: Zenaida Deed on 11/07/2023 11:54:29 -------------------------------------------------------------------------------- Wound Assessment Details Patient Name: Date of Service: [place], [name] 11/07/2023 10:00 A M Medical Record Number: [id number] Patient Account Number: [id number] Date of Birth/Sex: Treating  RN: [birth year] (37 y.o. Damaris Schooner Primary Care Veasna Santibanez: [name], [name] Other Clinician: Referring Alexys Gassett: Treating Reygan Heagle/Extender: Benjamine Mola, [name]: 409 Wound Status Wound Number: [id number] Primary Etiology: Venous Leg Ulcer Longsworth, Emile E ([id number]) [id number] Page 11 of 18 Wound Location: Left, Lateral Lower Leg Wound Status: Open Wounding Event: Gradually Appeared Comorbid History: Sleep Apnea, Hypertension, Paraplegia Date Acquired: 06/22/2022 Weeks Of Treatment: 70 Clustered Wound: No Photos Wound Measurements Length: (cm) 2.2 Width: (cm) 1 Depth: (cm) 0.1 Area: (cm) 1.728 Volume: (cm) 0.173 % Reduction in Area: 69.2% % Reduction in Volume: 69.2% Epithelialization: Small (1-33%) Tunneling: No Undermining: No Wound Description Classification: Full Thickness Without Exposed Support Structures Wound Margin: Distinct, outline attached Exudate Amount: Medium Exudate Type: Serous Exudate Color: amber Foul Odor After Cleansing: No Slough/Fibrino No Wound Bed Granulation Amount: None Present (0%) Exposed Structure Necrotic Amount: Large (67-100%) Fascia Exposed: No Necrotic Quality: Adherent Slough Fat Layer (Subcutaneous Tissue) Exposed: Yes Tendon Exposed: No Muscle Exposed: No Joint Exposed: No Bone Exposed: No Periwound Skin Texture Texture Color No Abnormalities Noted: No No Abnormalities Noted: Yes Scarring: Yes Temperature / Pain Temperature: No Abnormality Moisture No Abnormalities Noted: No Dry / Scaly: Yes Treatment Notes Wound #67 (Lower Leg) Wound Laterality: Left, Lateral Cleanser Soap and Water Discharge Instruction: May shower and wash wound with dial antibacterial soap and water prior to dressing change. Wound Cleanser Discharge Instruction: Cleanse the wound with wound cleanser prior to applying a clean dressing using gauze sponges, not tissue or cotton balls. Peri-Wound  Care Sween Lotion (Moisturizing lotion) Discharge Instruction: Apply Aquaphor moisturizing lotion as directed Topical Mupirocin Ointment Discharge Instruction: Apply Mupirocin (Bactroban) as instructed Primary Dressing Maxorb Extra Ag+ Alginate Dressing, 2x2 (in/in) Discharge Instruction: Apply to wound bed as instructed Hajjar, Vayden E ([id number]) [id number] Page 12  of 18 Secondary Dressing Woven Gauze Sponge, Non-Sterile 4x4 in Discharge Instruction: Apply over primary dressing as directed. Secured With [employer], 4.5x3.1 (in/yd) Discharge Instruction: Secure with Kerlix as directed. 223M Medipore H Soft Cloth Surgical T ape, 4 x 10 (in/yd) Discharge Instruction: Secure with tape as directed. Compression Wrap Compression Stockings Add-Ons Electronic Signature(s) Signed: 11/07/2023 5:41:44 PM By: Zenaida Deed RN, BSN Entered By: Zenaida Deed on 11/07/2023 11:55:15 -------------------------------------------------------------------------------- Wound Assessment Details Patient Name: Date of Service: [place]rien, [name] 11/07/2023 10:00 A M Medical Record Number: [id number] Patient Account Number: [id number] Date of Birth/Sex: Treating RN: [birth year] (37 y.o. Damaris Schooner Primary Care Eldana Isip: [name], [name] Other Clinician: Referring Maretta Overdorf: Treating Kelven Flater/Extender: Benjamine Mola, [name]: 409 Wound Status Wound Number: [id number] Primary Etiology: Pressure Ulcer Wound Location: Left, Posterior Upper Leg Wound Status: Open Wounding Event: Pressure Injury Comorbid History: Sleep Apnea, Hypertension, Paraplegia Date Acquired: 01/10/2023 Weeks Of Treatment: 41 Clustered Wound: Yes Photos Wound Measurements Length: (cm) Width: (cm) Depth: (cm) Clustered Quantity: Area: (cm) Volume: (cm) 12.4 % Reduction in Area: 32.1% 5.2 % Reduction in Volume: 32.1% 0.1 Epithelialization: Medium (34-66%) 2 Tunneling:  No 50.642 Undermining: No 5.064 Wound Description Classification: Category/Stage III Wound Margin: Flat and Intact Exudate Amount: Medium Exudate Type: Serosanguineous Exudate Color: red, brown Dado, Jerred E ([id number]) Wound Bed Granulation Amount: Large (67-100%) Granulation Quality: Red, Friable Necrotic Amount: Small (1-33%) Necrotic Quality: Adherent Slough Foul Odor After Cleansing: No Slough/Fibrino No [id number] Page 13 of 18 Exposed Structure Fascia Exposed: No Fat Layer (Subcutaneous Tissue) Exposed: Yes Tendon Exposed: No Muscle Exposed: No Joint Exposed: No Bone Exposed: No Periwound Skin Texture Texture Color No Abnormalities Noted: No No Abnormalities Noted: Yes Scarring: Yes Temperature / Pain Temperature: No Abnormality Moisture No Abnormalities Noted: Yes Treatment Notes Wound #74 (Upper Leg) Wound Laterality: Left, Posterior Cleanser Vashe 5.8 (oz) Discharge Instruction: Cleanse the wound with Vashe prior to applying a clean dressing using gauze sponges , let sit on wound for 10 minutes Peri-Wound Care Zinc Oxide Ointment 30g tube Discharge Instruction: Apply Zinc Oxide to periwound with each dressing change as needed fpr moisture Topical Mupirocin Ointment Discharge Instruction: Apply Mupirocin (Bactroban) as instructed [employer]stone antibiotic compound Primary Dressing Maxorb Extra Ag+ Alginate Dressing, 4x4.75 (in/in) Discharge Instruction: Apply to wound bed as instructed Secondary Dressing Zetuvit Plus Silicone Border Sacrum Dressing, Sm, 7x7 (in/in) Discharge Instruction: Apply silicone border over primary dressing as directed. Secured With 223M Medipore H Soft Cloth Surgical T ape, 4 x 10 (in/yd) Discharge Instruction: Secure with tape as directed. Compression Wrap Compression Stockings Add-Ons Electronic Signature(s) Signed: 11/07/2023 5:41:44 PM By: Zenaida Deed RN, BSN Entered By: Zenaida Deed on  11/07/2023 11:56:06 -------------------------------------------------------------------------------- Wound Assessment Details Patient Name: Date of Service: Eckman, [name] 11/07/2023 10:00 A M Medical Record Number: [id number] Patient Account Number: [id number] Date of Birth/Sex: Treating RN: 12/08/1985 (37 y.o. Damaris Schooner Primary Care Shealee Yordy: [name], [name] Other Clinician: Referring Jaymon Dudek: Treating Bernedette Auston/Extender: Benjamine Mola, [name]: 409 Wound Status Wound Number: [id number] Primary Etiology: Venous Leg Ulcer Madani, Miliano E ([id number]) [id number] Page 14 of 18 Wound Location: Right, Anterior Ankle Wound Status: Open Wounding Event: Gradually Appeared Comorbid History: Sleep Apnea, Hypertension, Paraplegia Date Acquired: 07/04/2023 Weeks Of Treatment: 16 Clustered Wound: No Photos Wound Measurements Length: (cm) 0.5 Width: (cm) 0.5 Depth: (cm) 0.1 Area: (cm) 0.196 Volume: (cm) 0.02 % Reduction in Area: 89.1% % Reduction in Volume: 89% Epithelialization: Medium (34-66%) Tunneling: No  Undermining: No Wound Description Classification: Full Thickness Without Exposed Support Structures Wound Margin: Flat and Intact Exudate Amount: Small Exudate Type: Serosanguineous Exudate Color: red, brown Foul Odor After Cleansing: No Slough/Fibrino No Wound Bed Granulation Amount: Large (67-100%) Exposed Structure Granulation Quality: Red Fascia Exposed: No Necrotic Amount: None Present (0%) Fat Layer (Subcutaneous Tissue) Exposed: Yes Tendon Exposed: No Muscle Exposed: No Joint Exposed: No Bone Exposed: No Periwound Skin Texture Texture Color No Abnormalities Noted: No No Abnormalities Noted: Yes Scarring: Yes Temperature / Pain Temperature: No Abnormality Moisture No Abnormalities Noted: Yes Treatment Notes Wound #78 (Ankle) Wound Laterality: Right, Anterior Cleanser Soap and Water Discharge Instruction:  May shower and wash wound with dial antibacterial soap and water prior to dressing change. Wound Cleanser Discharge Instruction: Cleanse the wound with wound cleanser prior to applying a clean dressing using gauze sponges, not tissue or cotton balls. Peri-Wound Care Sween Lotion (Moisturizing lotion) Discharge Instruction: Apply Aquaphor moisturizing lotion as directed Topical Mupirocin Ointment Discharge Instruction: Apply Mupirocin (Bactroban) as instructed Primary Dressing Maxorb Extra Ag+ Alginate Dressing, 2x2 (in/in) Discharge Instruction: Apply to wound bed as instructed Hellums, Darry E ([id number]) [id number] Page 15 of 18 Secondary Dressing Woven Gauze Sponge, Non-Sterile 4x4 in Discharge Instruction: Apply over primary dressing as directed. Secured With [employer], 4.5x3.1 (in/yd) Discharge Instruction: Secure with Kerlix as directed. 16M Medipore H Soft Cloth Surgical T ape, 4 x 10 (in/yd) Discharge Instruction: Secure with tape as directed. Compression Wrap Compression Stockings Add-Ons Electronic Signature(s) Signed: 11/07/2023 5:41:44 PM By: Zenaida Deed RN, BSN Entered By: Zenaida Deed on 11/07/2023 11:56:56 -------------------------------------------------------------------------------- Wound Assessment Details Patient Name: Date of Service: Houchins, [name] 11/07/2023 10:00 A M Medical Record Number: [id number] Patient Account Number: [id number] Date of Birth/Sex: Treating RN: [birth year] (37 y.o. Damaris Schooner Primary Care Harsimran Westman: [name], [name] Other Clinician: Referring Jeneva Schweizer: Treating Claus Silvestro/Extender: Benjamine Mola, [name]: 409 Wound Status Wound Number: 81 Primary Etiology: Lymphedema Wound Location: Right, Plantar Foot Wound Status: Open Wounding Event: Gradually Appeared Comorbid History: Sleep Apnea, Hypertension, Paraplegia Date Acquired: 09/23/2023 Weeks Of Treatment:  6 Clustered Wound: No Photos Wound Measurements Length: (cm) 1.5 Width: (cm) 3.8 Depth: (cm) 0.1 Area: (cm) 4.477 Volume: (cm) 0.448 % Reduction in Area: -714% % Reduction in Volume: -714.5% Epithelialization: Medium (34-66%) Tunneling: No Undermining: No Wound Description Classification: Full Thickness Without Exposed Support Structures Wound Margin: Flat and Intact Exudate Amount: Medium Exudate Type: Serous Exudate Color: amber Foul Odor After Cleansing: No Slough/Fibrino Yes Wound Bed Bracknell, Caryl E ([id number]) [id number] Page 16 of 18 Granulation Amount: Small (1-33%) Exposed Structure Granulation Quality: Pink Fascia Exposed: No Necrotic Amount: Large (67-100%) Fat Layer (Subcutaneous Tissue) Exposed: Yes Necrotic Quality: Adherent Slough Tendon Exposed: No Muscle Exposed: No Joint Exposed: No Bone Exposed: No Periwound Skin Texture Texture Color No Abnormalities Noted: Yes No Abnormalities Noted: Yes Moisture Temperature / Pain No Abnormalities Noted: No Temperature: No Abnormality Maceration: Yes Treatment Notes Wound #81 (Foot) Wound Laterality: Plantar, Right Cleanser Soap and Water Discharge Instruction: May shower and wash wound with dial antibacterial soap and water prior to dressing change. Wound Cleanser Discharge Instruction: Cleanse the wound with wound cleanser prior to applying a clean dressing using gauze sponges, not tissue or cotton balls. Peri-Wound Care Sween Lotion (Moisturizing lotion) Discharge Instruction: Apply Aquaphor moisturizing lotion as directed Topical Mupirocin Ointment Discharge Instruction: Apply Mupirocin (Bactroban) as instructed Primary Dressing Maxorb Extra Ag+ Alginate Dressing, 2x2 (in/in) Discharge Instruction: Apply to wound bed as instructed  Secondary Dressing Woven Gauze Sponge, Non-Sterile 4x4 in Discharge Instruction: Apply over primary dressing as directed. Secured With [employer], 4.5x3.1 (in/yd) Discharge Instruction: Secure with Kerlix as directed. 70M Medipore H Soft Cloth Surgical T ape, 4 x 10 (in/yd) Discharge Instruction: Secure with tape as directed. Compression Wrap Compression Stockings Add-Ons Electronic Signature(s) Signed: 11/07/2023 5:41:44 PM By: Zenaida Deed RN, BSN Entered By: Zenaida Deed on 11/07/2023 11:58:03 -------------------------------------------------------------------------------- Wound Assessment Details Patient Name: Date of Service: Shankle, [name] 11/07/2023 10:00 A M Medical Record Number: [id number] Patient Account Number: [id number] Date of Birth/Sex: Treating RN: 1986/09/02 (37 y.o. Damaris Schooner Primary Care Damonica Chopra: [name], [name] Other Clinician: Referring Devean Skoczylas: Treating Carolin Quang/Extender: Benjamine Mola, [name]: [address] Theodoro E ([id number]) [id number] Page 17 of 18 Wound Status Wound Number: 82 Primary Etiology: Pressure Ulcer Wound Location: Left, Proximal, Lateral Lower Leg Wound Status: Open Wounding Event: Pressure Injury Comorbid History: Sleep Apnea, Hypertension, Paraplegia Date Acquired: 11/04/2023 Weeks Of Treatment: 0 Clustered Wound: No Photos Wound Measurements Length: (cm) 1.4 Width: (cm) 2.7 Depth: (cm) 0.1 Area: (cm) 2.969 Volume: (cm) 0.297 % Reduction in Area: % Reduction in Volume: Epithelialization: Small (1-33%) Tunneling: No Undermining: No Wound Description Classification: Unstageable/Unclassified Wound Margin: Indistinct, nonvisible Exudate Amount: Medium Exudate Type: Serosanguineous Exudate Color: red, brown Foul Odor After Cleansing: No Slough/Fibrino Yes Wound Bed Granulation Amount: None Present (0%) Exposed Structure Necrotic Amount: Large (67-100%) Fascia Exposed: No Necrotic Quality: Eschar, Adherent Slough Fat Layer (Subcutaneous Tissue) Exposed: Yes Tendon Exposed: No Muscle  Exposed: No Joint Exposed: No Bone Exposed: No Periwound Skin Texture Texture Color No Abnormalities Noted: Yes No Abnormalities Noted: Yes Moisture Temperature / Pain No Abnormalities Noted: Yes Temperature: No Abnormality Treatment Notes Wound #82 (Lower Leg) Wound Laterality: Left, Lateral, Proximal Cleanser Soap and Water Discharge Instruction: May shower and wash wound with dial antibacterial soap and water prior to dressing change. Wound Cleanser Discharge Instruction: Cleanse the wound with wound cleanser prior to applying a clean dressing using gauze sponges, not tissue or cotton balls. Peri-Wound Care Sween Lotion (Moisturizing lotion) Discharge Instruction: Apply Aquaphor moisturizing lotion as directed Topical Mupirocin Ointment Discharge Instruction: Apply Mupirocin (Bactroban) as instructed Primary Dressing Maxorb Extra Ag+ Alginate Dressing, 2x2 (in/in) Discharge Instruction: Apply to wound bed as instructed Cruey, Zen E ([id number]) [id number] Page 18 of 18 Secondary Dressing Woven Gauze Sponge, Non-Sterile 4x4 in Discharge Instruction: Apply over primary dressing as directed. Secured With [employer], 4.5x3.1 (in/yd) Discharge Instruction: Secure with Kerlix as directed. 70M Medipore H Soft Cloth Surgical T ape, 4 x 10 (in/yd) Discharge Instruction: Secure with tape as directed. Compression Wrap Compression Stockings Add-Ons Electronic Signature(s) Signed: 11/07/2023 5:41:44 PM By: Zenaida Deed RN, BSN Entered By: Zenaida Deed on 11/07/2023 11:59:08 -------------------------------------------------------------------------------- Vitals Details Patient Name: Date of Service: Norris, [name] 11/07/2023 10:00 A M Medical Record Number: [id number] Patient Account Number: [id number] Date of Birth/Sex: Treating RN: 09/12/1986 (37 y.o. M) Primary Care Hadley Detloff: [name], [name] Other Clinician: Referring Kayleigh Broadwell: Treating  Fatoumata Albaugh/Extender: Benjamine Mola, [name]: 409 Vital Signs Time Taken: 10:23 Temperature (F): 98.3 Height (in): 70 Pulse (bpm): 130 Weight (lbs): 216 Respiratory Rate (breaths/min): 18 Body Mass Index (BMI): 31 Blood Pressure (mmHg): 129/71 Reference Range: 80 - 120 mg / dl Electronic Signature(s) Signed: 11/07/2023 5:41:44 PM By: Zenaida Deed RN, BSN Entered By: Zenaida Deed on 11/07/2023 10:25:52

## 2023-11-08 NOTE — Progress Notes (Signed)
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 progressing towards closure 10/28/16; the wounds on his toes appear to be closed. No major change in the left lateral leg wound although the surface looks somewhat better using Iodoflex. He has had previous arterial studies that were normal. He has had reflux studies and is status post ablation although I don't have any exact notes on which vein was ablated. I'll need to check the surgical record 11/04/16; he's had a reopening between the first and second toe on the left and right. No major change in the left lateral leg wound. There is what appears to be cellulitis of the left dorsal foot 11/18/16 the patient was hospitalized initially in [place] and then subsequently transferred to [facility] and was admitted there from  11/09/16 through 11/12/16. He had developed progressive cellulitis on the right leg in spite of the doxycycline I gave him. I'd spoken to the hospitalist in [place] who was concerned about continuing leukocytosis. CT scan is what I suggested this was done which showed soft tissue swelling without evidence of osteomyelitis or an underlying abscess blood cultures were negative. At [facility] he was treated with vancomycin and Primaxin and then add an infectious disease consult. He was transitioned to Ceftaroline. He has been making progressive improvement. Overall a severe cellulitis of the right leg. He is been using silver alginate to her original wound on the left leg. The wounds in his toes on the right are closed there is a small open area on the base of the left second toe 11/26/15; the patient's right leg is much better although there is still some edema here this could be reminiscent from his severe cellulitis likely on top of some degree of lymphedema. His left anterior leg wound has less surface slough as reported by her intake nurse. Small wound at the base of the left second toe 12/02/16; patient's right leg is better and there is no open wound here. His left anterior lateral leg wound continues to have a healthy-looking surface. Small wound at the base of the left second toe however there is erythema in the left forefoot which is worrisome 12/16/16; is no open wounds on his right leg. We took measurements for stockings. His left anterior lateral leg wound continues to have a healthy-looking surface. I'm not sure where we were with the Apligraf run through his insurance. We have been using Iodoflex. He has a thick eschar on the left first second toe interface, I suspect this may be fungal however there is no visible open 12/23/16; no open wound on his right leg. He has 2 small areas left of the linear wound that was remaining last week. We have been using Prisma, I thought I have disclosed this  week, we can only look forward to next week 01/03/17; the patient had concerning areas of erythema last week, already on doxycycline for UTI through his primary doctor. The erythema is absolutely no better there is warmth and swelling both medially from the left lateral leg wound and also the dorsal left foot. 01/06/17- Patient is here for follow-up evaluation of his left lateral leg ulcer and bilateral feet ulcers. He is on oral antibiotic therapy, tolerating that. Nursing staff and the patient states that the erythema is improved from Monday. 01/13/17; the  predominant left lateral leg wound continues to be problematic. I had put Apligraf on him earlier this month once. However he subsequently developed what appeared to be an intense cellulitis around the left lateral leg wound. I gave him Dalvance I think on 2/12 perhaps 2/13 he continues on cefdinir. The erythema is still present but the warmth and swelling is improved. I am hopeful that the cellulitis part of this control. I wouldn't be surprised if there is an element of venous inflammation as well. 01/17/17. The erythema is present but better in the left leg. His left lateral leg wound still does not have a viable surface buttons certain parts of this long thin wound it appears like there has been improvement in dimensions. 01/20/17; the erythema still present but much better in the left leg. I'm thinking this is his usual degree of chronic venous inflammation. The wound on the left leg looks somewhat better. Is less surface slough 01/27/17; erythema is back to the chronic venous inflammation. The wound on the left leg is somewhat better. I am back to the point where I like to try an Apligraf once again 02/10/17; slight improvement in wound dimensions. Apligraf #2. He is completing his doxycycline 02/14/17; patient arrives today having completed doxycycline last Thursday. This was supposed to be a nurse visit however once again he hasn't tense erythema  from the medial part of his wound extending over the lower leg. Also erythema in his foot this is roughly in the same distribution as last time. He has baseline chronic venous inflammation however this is a lot worse than the baseline I have learned to accept the on him is baseline inflammation 02/24/17- patient is here for follow-up evaluation. He is tolerating compression therapy. His voicing no complaints or concerns he is here anticipating an Apligraf 03/03/17; he arrives today with an adherent necrotic surface. I don't think this is surface is going to be amenable for Apligraf's. The erythema around his wound and on the left dorsal foot has resolved he is off antibiotics 03/10/17; better-looking surface today. I don't think he can tolerate Apligraf's. He tells me he had a wound VAC after a skin graft years ago to this area and they had difficulty with a seal. The erythema continues to be stable around this some degree of chronic venous inflammation but he also has recurrent cellulitis. We have been using Iodoflex 03/17/17; continued improvement in the surface and may be small changes in dimensions. Using Iodoflex which seems the only thing that will control his surface 03/24/17- He is here for follow up evaluation of his LLE lateral ulceration and ulcer to right dorsal foot/toe space. He is voicing no complaints or concerns, He is tolerating compression wrap. 03/31/17 arrives today with a much healthier looking wound on the left lower extremity. We have been using Iodoflex for a prolonged period of time which has for the first time prepared and adequate looking wound bed although we have not had much in the way of wound dimension improvement. He also has a small wound between the first and second toe on the right 04/07/17; arrives today with a healthy-looking wound bed and at least the top 50% of this wound appears to be now her. No debridement was required I have changed him to [facility] last week  after prolonged Iodoflex. He did not do well with Apligraf's. We've had a re-opening between the first and second toe on the right 04/14/17; arrives today with a healthier looking wound bed contractions  and the top 50% of this wound and some on the lesser 50%. Wound bed appears healthy. The area between the first and second toe on the right still remains problematic 04/21/17; continued very gradual improvement. Using [facility] 04/28/17; continued very gradual improvement in the left lateral leg venous insufficiency wound. His periwound erythema is very mild. We have been using Hydrofera Blue. Wound is making progress especially in the superior 50% 05/05/17; he continues to have very gradual improvement in the left lateral venous insufficiency wound. Both in terms with an length rings are improving. I debrided this every 2 weeks with #5 curet and we have been using Hydrofera Blue and again making good progress With regards to the wounds between his right first and second toe which I thought might of been tinea pedis he is not making as much progress very dry scaly skin over the area. Also the area at the base of the left first and second toe in a similar condition 05/12/17; continued gradual improvement in the refractory left lateral venous insufficiency wound on the left. Dimension smaller. Surface still requiring debridement using Hydrofera Blue 05/19/17; continued gradual improvement in the refractory left lateral venous ulceration. Careful inspection of the wound bed underlying rumination suggested some degree of epithelialization over the surface no debridement indicated. Continue Hydrofera Blue difficult areas between his toes first and third on the left than first and second on the right. I'm going to change to silver alginate from silver collagen. Continue ketoconazole as I suspect underlying tinea pedis 05/26/17; left lateral leg venous insufficiency wound. We've been using Hydrofera Blue. I  believe that there is expanding epithelialization over the surface of the wound albeit not coming from the wound circumference. This is a bit of an odd situation in which the epithelialization seems to be coming from the surface of the wound rather than in the exact circumference. There is still small open areas mostly along the lateral margin of the wound. He has unchanged areas between the left first and second and the right first second toes which I been treating for tenia pedis 06/02/17; left lateral leg venous insufficiency wound. We have been using Hydrofera Blue. Somewhat smaller from the wound circumference. The surface of the wound remains a bit on it almost epithelialized sedation in appearance. I use an open curette today debridement in the surface of all of this especially the edges Small open wounds remaining on the dorsal right first and second toe interspace and the plantar left first second toe and her face on the left 06/09/17; wound on the left lateral leg continues to be smaller but very gradual and very dry surface using Hydrofera Blue 06/16/17 requires weekly debridements now on the left lateral leg although this continues to contract. I changed to silver collagen last week because of dryness Tunnell, Kristofer E ([id number]) [id number] Page 8 of 40 of the wound bed. Using Iodoflex to the areas on his first and second toes/web space bilaterally 06/24/17; patient with history of paraplegia also chronic venous insufficiency with lymphedema. Has a very difficult wound on the left lateral leg. This has been gradually reducing in terms of with but comes in with a very dry adherent surface. High switch to silver collagen a week or so ago with hydrogel to keep the area moist. This is been refractory to multiple dressing attempts. He also has areas in his first and second toes bilaterally in the anterior and posterior web space. I had been using Iodoflex here after a  prolonged course of silver alginate with ketoconazole was ineffective [question tinea pedis] 07/14/17; patient arrives today with a very difficult adherent material over his left lateral lower leg wound. He also has surrounding erythema and poorly controlled edema. He was switched his Santyl last visit which the nurses are applying once during his doctor visit and once on a nurse visit. He was also reduced to 2 layer compression I'm not exactly sure of the issue here. 07/21/17; better surface today after 1 week of Iodoflex. Significant cellulitis that we treated last week also better. [Doxycycline] 07/28/17 better surface today with now 2 weeks of Iodoflex. Significant cellulitis treated with doxycycline. He has now completed the doxycycline and he is back to his usual degree of chronic venous inflammation/stasis dermatitis. He reminds me he has had ablations surgery here 08/04/17; continued improvement with Iodoflex to the left lateral leg wound in terms of the surface of the wound although the dimensions are better. 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 Problematic areas on the plantar aspect of the first second toe web space on the left and the dorsal aspect of the first second toe web space on the right. At one point I felt these were probably related to chronic fungal infections in treated him aggressively for this although we have not made any improvement here. 08/11/17; left lateral leg. Surface continues to improve with the Iodoflex although we are not seeing much improvement in overall wound dimensions. Areas on his plantar left foot and right foot show no improvement. In fact the right foot looks somewhat worse 08/18/17; left lateral leg. We changed to [facility] Blue last week after a prolonged course of Iodoflex which helps get the surface better. It appears that the wound with is improved. Continue with difficult areas on  the left dorsal first second and plantar first second on the right 09/01/17; patient arrives in clinic today having had a temperature of 103 yesterday. He was seen in the ER and [facility]. The patient was concerned he could have cellulitis again in the right leg however they diagnosed him with a UTI and he is now on Keflex. He has a history of cellulitis which is been recurrent and difficult but this is been in the left leg, in the past 5 use doxycycline. He does in and out catheterizations at home which are risk factors for UTI 09/08/17; patient will be completing his Keflex this weekend. The erythema on the left leg is considerably better. He has a new wound today on the medial part of the right leg small superficial almost looks like a skin tear. He has worsening of the area on the right dorsal first and second toe. His major area on the left lateral leg is better. Using Hydrofera Blue on all areas 09/15/17; gradual reduction in width on the long wound in the left lateral leg. No debridement required. He also has wounds on the plantar aspect of his left first second toe web space and on the dorsal aspect of the right first second toe web space. 09/22/17; there continues to be very gradual improvements in the dimensions of the left lateral leg wound. He hasn't round erythematous spot with might be pressure on his wheelchair. There is no evidence obviously of infection no purulence no warmth He has a dry scaled area on the plantar aspect of the left first second toe Improved area on the dorsal right first second toe. 09/29/17; left lateral leg  wound continues to improve in dimensions mostly with an is still a fairly long but increasingly narrow wound. He has a dry scaled area on the plantar aspect of his left first second toe web space Increasingly concerning area on the dorsal right first second toe. In fact I am concerned today about possible cellulitis around this wound. The areas extending up his  second toe and although there is deformities here almost appears to abut on the nailbed. 10/06/17; left lateral leg wound continues to make very gradual progress. Tissue culture I did from the right first second toe dorsal foot last time grew MRSA and enterococcus which was vancomycin sensitive. This was not sensitive to clindamycin or doxycycline. He is allergic to Zyvox and sulfa we have therefore arrange for him to have dalvance infusion tomorrow. He is had this in the past and tolerated it well 10/20/17; left lateral leg wound continues to make decent progress. This is certainly reduced in terms of with there is advancing epithelialization.The cellulitis in the right foot looks better although he still has a deep wound in the dorsal aspect of the first second toe web space. Plantar left first toe web space on the left I think is making some progress 10/27/17; left lateral leg wound continues to make decent progress. Advancing epithelialization.using Hydrofera Blue The right first second toe web space wound is better-looking using silver alginate Improvement in the left plantar first second toe web space. Again using silver alginate 11/03/17 left lateral leg wound continues to make decent progress albeit slowly. Using [facility] The right per second toe web space continues to be a very problematic looking punched out wound. I obtained a piece of tissue for deep culture I did extensively treated this for fungus. It is difficult to imagine that this is a pressure area as the patient states other than going outside he doesn't really wear shoes at home The left plantar first second toe web space looked fairly senescent. Necrotic edges. This required debridement change to [facility] Blue to all wound areas 11/10/17; left lateral leg wound continues to contract. Using Hydrofera Blue On the right dorsal first second toe web space dorsally. Culture I did of this area last week grew MRSA there is not an  easy oral option in this patient was multiple antibiotic allergies or intolerances. This was only a rare culture isolate I'm therefore going to use Bactroban under silver alginate On the left plantar first second toe web space. Debridement is required here. This is also unchanged 11/17/17; left lateral leg wound continues to contract using Hydrofera Blue this is no longer the major issue. The major concern here is the right first second toe web space. He now has an open area going from dorsally to the plantar aspect. There is now wound on the inner lateral part of the first toe. Not a very viable surface on this. There is erythema spreading medially into the forefoot. No major change in the left first second toe plantar wound 11/24/17; left lateral leg wound continues to contract using Hydrofera Blue. Nice improvement today The right first second toe web space all of this looks a lot less angry than last week. I have given him clindamycin and topical Bactroban for MRSA and terbinafine for the possibility of underlining tinea pedis that I could not control with ketoconazole. Looks somewhat better The area on the plantar left first second toe web space is weeping with dried debris around the wound 12/01/17; left lateral leg wound continues to  contract he [employer]	[place]. It is becoming thinner in terms of with nevertheless it is making good improvement. The right first second toe web space looks less angry but still a large necrotic-looking wounds starting on the plantar aspect of the right foot extending between the toes and now extensively on the base of the right second toe. I gave him clindamycin and topical Bactroban for MRSA anterior benefiting for the possibility of underlying tinea pedis. Not looking better today The area on the left first/second toe looks better. Debrided of necrotic debris 12/05/17* the patient was worked in urgently today because over the weekend he found blood on his  incontinence bad when he woke up. He was found to have an ulcer by his wife who does most of his wound care. He came in today for [place] to look at this. He has not had a history of wounds in his buttocks in spite of his paraplegia. 12/08/17; seen in follow-up today at his usual appointment. He was seen earlier this week and found to have a new wound on his buttock. We also follow him for wounds on the left lateral leg, left first second toe web space and right first second toe web space 12/15/17; we have been using Hydrofera Blue to the left lateral leg which has improved. The right first second toe web space has also improved. Left first second toe web space plantar aspect looks stable. The left buttock has worsened using Santyl. Apparently the buttock has drainage 12/22/17; we have been using Hydrofera Blue to the left lateral leg which continues to improve now 2 small wounds separated by normal skin. He tells [place] he had a fever up to 100 yesterday he is prone to UTIs but has not noted anything different. He does in and out catheterizations. The area between the first and second toes today does not look good necrotic surface covered with what looks to be purulent drainage and erythema extending into the third toe. I had gotten this to something that I thought look better last time however it is not look good today. He also has a necrotic surface over the buttock wound which is expanded. I thought there might be infection under here so I removed a lot of the surface with a #5 curet though nothing look like it really needed culturing. He is been using Santyl to this area 12/27/17; his original wound on the left lateral leg continues to improve using Hydrofera Blue. I gave him samples of Baxdella although he was unable to take them out of fear for an allergic reaction ["lump in his throat"].the culture I did of the purulent drainage from his second toe last week showed both enterococcus and a set Enterobacter I  was also concerned about the erythema on the bottom of his foot although paradoxically although this looks somewhat better today. 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 We've been using silver alginate here. They came up today that he sleeps in a recliner, I'm not sure why but I asked him to stop this 01/03/18; his original wound we've been using Hydrofera Blue is now separated into 2 areas. Ulcer on his left buttock is better he is off the recliner and sleeping in bed Finally both wound areas between his first and second toes also looks some better Myhand, Jerrico E ([id number]) [id number] Page 9 of 40 01/10/18; his original wound on the left lateral leg  is now separated into 2 wounds we've been using Hydrofera Blue Ulcer on his left buttock has some drainage. There is a small probing site going into muscle layer superiorly.using silver alginate -He arrives today with a deep tissue injury on the left heel The wound on the dorsal aspect of his first second toe on the left looks a lot betterusing silver alginate ketoconazole The area on the first second toe web space on the right also looks a lot bette 01/17/18; his original wound on the left lateral leg continues to progress using Hydrofera Blue Ulcer on his left buttock also is smaller surface healthier except for a small probing site going into the muscle layer superiorly. 2.4 cm of tunneling in this area DTI on his left heel we have only been offloading. Looks better than last week no threatened open no evidence of infection the wound on the dorsal aspect of the first second toe on the left continues to look like it's regressing we have only been using silver alginate and terbinafine orally The area in the first second toe web space on the right also looks to be a lot better using silver alginate and terbinafine I think this was  prompted by tinea pedis 01/31/18; the patient was hospitalized in [place] last week apparently for a complicated UTI. He was discharged on cefepime he does in and out catheterizations. In the hospital he was discovered M I don't mild elevation of AST and ALT and the terbinafine was stopped.predictably the pressure ulcer on s his buttock looks betterusing silver alginate. The area on the left lateral leg also is better using Hydrofera Blue. The area between the first and second toes on the left better. First and second toes on the right still substantial but better. Finally the DTI on the left heel has held together and looks like it's resolving 02/07/18-he is here in follow-up evaluation for multiple ulcerations. He has new injury to the lateral aspect of the last issue a pressure ulcer, he states this is from adhesive removal trauma. He states he has tried multiple adhesive products with no success. All other ulcers appear stable. The left heel DTI is resolving. We will continue with same treatment plan and follow-up next week. 02/14/18; follow-up for multiple areas. He has a new area last week on the lateral aspect of his pressure ulcer more over the posterior trochanter. The original pressure ulcer looks quite stable has healthy granulation. 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 Almost fully epithelialized on the original superior area using Hydrofera Blue DTI on the left heel has peeled off this week to reveal a small superficial wound under denuded skin and subcutaneous tissue Both areas between the first and second toes look better including nothing open on the left 02/21/18; The patient's wounds on his left ischial tuberosity and posterior left greater trochanter actually looked better. He has a large area of irritation around the area which I think is contact dermatitis. I am doubtful  that this is fungal His original wound on the left lateral calf continues to improve we have been using Hydrofera Blue There is no open area in the left first second toe web space although there is a lot of thick callus The DTI on the left heel required debridement today of necrotic surface eschar and subcutaneous tissue using silver alginate Finally the area on the right first second toe webspace continues to  contract using silver alginate and ketoconazole 02/28/18 Left ischial tuberosity wounds look better using silver alginate. Original wound on the left calf only has one small open area left using Hydrofera Blue DTI on the left heel required debridement mostly removing skin from around this wound surface. Using silver alginate The areas on the right first/second toe web space using silver alginate and ketoconazole 03/08/18 on evaluation today patient appears to be doing decently well as best I can tell in regard to his wounds. This is the first time that I have seen him as he generally is followed by Dr. Leanord Hawking. With that being said none of his wounds appear to be infected he does have an area where there is some skin covering what appears to be a new wound on the left dorsal surface of his great toe. This is right at the nail bed. With that being said I do believe that debrided away some of the excess skin can be of benefit in this regard. Otherwise he has been tolerating the dressing changes without complication. 03/14/18; patient arrives today with the multiplicity of wounds that we are following. He has not been systemically unwell Original wound on the left lateral calf now only has 2 small open areas we've been using Hydrofera Blue which should continue The deep tissue injury on the left heel requires debridement today. We've been using silver alginate The left first second toe and the right first second toe are both are reminiscence what I think was tinea pedis. Apparently some of the callus  Surface between the toes was removed last week when it started draining. Purulent drainage coming from the wound on the ischial tuberosity on the left. 03/21/18-He is here in follow-up evaluation for multiple wounds. 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 He tolerated debridement. The only change to last week's recommendations is to discontinue antifungal cream between toes. He will follow-up next week 03/28/18; following up for multiple wounds;Concern this week is streaking redness and swelling in the right foot. He is going to need antibiotics for this. 03/31/18; follow-up for right foot cellulitis. Streaking redness and swelling in the right foot on 03/28/18. 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 I put him on clindamycin 300 mg every 6 and brought him in for a quick check. He has an open wound between his first and second toes on the right foot as a potential source. 04/04/18; Right foot cellulitis is resolving he is completing clindamycin. This is truly good news Left lateral calf wound which is initial wound only has one small open area inferiorly this is close to healing out. He has compression stockings. We will use Hydrofera Blue right down to the epithelialization of this Nonviable surface on the left heel which was initially pressure with a DTI. We've been using Hydrofera Blue. I'm going to switch this back to silver alginate Left first second toe/tinea pedis this looks better using silver alginate Right first second toe tinea pedis using silver alginate Large pressure ulcers on theLeft ischial tuberosity. Small wound here Looks better. I am uncertain about the surface over the large wound. Using silver alginate 04/11/18; Cellulitis in the right foot is resolved Left lateral calf wound which was his original wounds still has 2 tiny open areas remaining this is just about closed Nonviable  surface on the left heel is better but still requires debridement Left first second toe/tinea pedis still open using silver alginate  Right first second toe wound tinea pedis I asked him to go back to using ketoconazole and silver alginate Large pressure ulcers on the left ischial tuberosity this shear injury here is resolved. Wound is smaller. No evidence of infection using silver alginate 04/18/18; Patient arrives with an intense area of cellulitis in the right mid lower calf extending into the right heel area. Bright red and warm. Smaller area on the left anterior leg. He has a significant history of MRSA. He will definitely need antibioticsdoxycycline He now has 2 open areas on the left ischial tuberosity the original large wound and now a satellite area which I think was above his initial satellite areas. Not a wonderful surface on this satellite area surrounding erythema which looks like pressure related. His left lateral calf wound again his original wound is just about closed Left heel pressure injury still requiring debridement Left first second toe looks a lot better using silver alginate Right first second toe also using silver alginate and ketoconazole cream also looks better 04/20/18; the patient was worked in early today out of concerns with his cellulitis on the right leg. I had started him on doxycycline. This was 2 days ago. His wife was concerned about the swelling in the area. Also concerned about the left buttock. He has not been systemically unwell no fever chills. No nausea vomiting or diarrhea 04/25/18; the patient's left buttock wound is continued to deteriorate he is using Hydrofera Blue. He is still completing clindamycin for the cellulitis on the right leg although all of this looks better. 05/02/18 Left buttock wound still with a lot of drainage and a very tightly adherent fibrinous necrotic surface. He has a deeper area superiorly Giovannini, Derrien E ([id number])  [id number] Page 10 of 40 The left lateral calf wound is still closed DTI wound on the left heel necrotic surface especially the circumference using Iodoflex Areas between his left first second toe and right first second toe both look better. Dorsally and the right first second toe he had a necrotic surface although at smaller. In using silver alginate and ketoconazole. I did a culture last week which was a deep tissue culture of the reminiscence of the open wound on the right first second toe dorsally. This grew a few Acinetobacter and a few methicillin-resistant staph aureus. Nevertheless the area actually this week looked better. I didn't feel the need to specifically address this at least in terms of systemic antibiotics. 05/09/18; wounds are measuring larger more drainage per our intake. We are using Santyl covered with alginate on the large superficial buttock wounds, Iodosorb on the left heel, ketoconazole and silver alginate to the dorsal first and second toes bilaterally. 05/16/18; The area on his left buttock better in some aspects although the area superiorly over the ischial tuberosity required an extensive debridement.using Santyl Left heel appears stable. Using Iodoflex The areas between his first and second toes are not bad however there is spreading erythema up the dorsal aspect of his left foot this looks like cellulitis again. He is insensate the erythema is really very brilliant.o Erysipelas He went to see an allergist days ago because he was itching part of this he had lab work done. This showed a white count of 15.1 with 70% neutrophils. Hemoglobin of 11.4 and a platelet count of 659,000. Last white count we had in Epic was a 2-1/2 years ago which was 25.9 but he was ill at the time. He was able to show me some lab work that  was done by his primary physician the pattern is about the same. 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 But prompted me to go ahead and do x-rays of both feet and the pelvis rule out osteomyelitis. He also had a comprehensive metabolic panel this was reasonably normal his albumin was 3.7 liver function tests BUN/creatinine all normal 05/23/18; x-rays of both his feet from last week were negative for underlying pulmonary abnormality. The x-ray of his pelvis however showed mild irregularity in the left ischial which may represent some early osteomyelitis. The wound in the left ischial continues to get deeper clearly now exposed muscle. Each week necrotic surface material over this area. Whereas the rest of the wounds do not look so bad. The left ischial wound we have been using Santyl and calcium alginate T the left heel surface necrotic debris using Iodoflex o The left lateral leg is still healed Areas on the left dorsal foot and the right dorsal foot are about the same. There is some inflammation on the left which might represent contact dermatitis, fungal dermatitis I am doubtful cellulitis although this looks better than last week 05/30/18; CT scan done at Hospital did not show any osteomyelitis or abscess. Suggested the possibility of underlying cellulitis although I don't see a lot of evidence of this at the bedside The wound itself on the left buttock/upper thigh actually looks somewhat better. No debridement Left heel also looks better no debridement continue Iodoflex Both dorsal first second toe spaces appear better using Lotrisone. Left still required debridement 06/06/18; Intake reported some purulent looking drainage from the left gluteal wound. Using Santyl and calcium alginate Left heel looks better although still a nonviable surface requiring debridement The left dorsal foot first/second webspace actually expanding and somewhat deeper. I may consider doing a shave biopsy of this area Right dorsal foot first/second webspace appears stable to improved. Using Lotrisone  and silver alginate to both these areas 06/13/18 Left gluteal surface looks better. Now separated in the 2 wounds. No debridement required. Still drainage. We'll continue silver alginate Left heel continues to look better with Iodoflex continue this for at least another week Of his dorsal foot wounds the area on the left still has some depth although it looks better than last week. We've been using Lotrisone and silver alginate 06/20/18 Left gluteal continues to look better healthy tissue Left heel continues to look better healthy granulation wound is smaller. He is using Iodoflex and his long as this continues continue the Iodoflex Dorsal right foot looks better unfortunately dorsal left foot does not. There is swelling and erythema of his forefoot. He had minor trauma to this several days ago but doesn't think this was enough to have caused any tissue injury. Foot looks like cellulitis, we have had this problem before 06/27/18 on evaluation today patient appears to be doing a little worse in regard to his foot ulcer. 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 Both of which would really be his only options for treating this infection. In the past he has been given and effusion of Orbactiv. This is done very well for him in the past again it's one time dosing IV antibiotic therapy. Subsequently I do believe this is something we're gonna need to see about doing at this point in time. Currently his  other wounds seem to be doing somewhat better in my pinion I'm pretty happy in that regard. 07/03/18 on evaluation today patient's wounds actually appear to be doing fairly well. He has been tolerating the dressing changes without complication. All in all he seems to be showing signs of improvement. In regard to the antibiotics he has been dealing with infectious disease  since I saw him last week as far as getting this scheduled. In the end [facility]'s [facility] this coming Friday. In the meantime he has been continuing to perform the dressing changes in such as previous. There does not appear to be any evidence of infection worsengin at this time. 07/10/18; Since I last saw this man 2 weeks ago things have actually improved. IV antibiotics of resulted in less forefoot erythema although there is still some present. He is not systemically unwell Left buttock wounds 2 now have no depth there is increased epithelialization Using silver alginate Left heel still requires debridement using Iodoflex Left dorsal foot still with a sizable wound about the size of a border but healthy granulation Right dorsal foot still with a slitlike area using silver alginate 07/18/18; the patient's cellulitis in the left foot is improved in fact I think it is on its way to resolving. Left buttock wounds 2 both look better although the larger one has hypertension granulation we've been using silver alginate Left heel has some thick circumferential redundant skin over the wound edge which will need to be removed today we've been using Iodoflex Left dorsal foot is still a sizable wound required debridement using silver alginate The right dorsal foot is just about closed only a small open area remains here 07/25/18; left foot cellulitis is resolved Left buttock wounds 2 both look better. Hyper-granulation on the major area Left heel as some debris over the surface but otherwise looks a healthier wound. Using silver collagen Right dorsal foot is just about closed 07/31/18; arrives with our intake nurse worried about purulent drainage from the buttock. We had hyper-granulation here last week His buttock wounds 2 continue to look better Left heel some debris over the surface but measuring smaller. Right dorsal foot unfortunately has openings  between the toes Left foot superficial wound looks less aggravated. 08/07/18 Buttock wounds continue to look better although some of her granulation and the larger medial wound. silver alginate Left heel continues to look a lot better.silver collagen Left foot superficial wound looks less stable. Requires debridement. He has a new wound superficial area on the foot on the lateral dorsal foot. Right foot looks better using silver alginate without Lotrisone 08/14/2018; patient was in the ER last week diagnosed with a UTI. He is now on Cefpodoxime and Macrodantin. Buttock wounds continued to be smaller. Using silver alginate Left heel continues to look better using silver collagen Left foot superficial wound looks as though it is improving Right dorsal foot area is just about healed. 08/21/2018; patient is completed his antibiotics for his UTI. He has 2 open areas on the buttocks. There is still not closed although the surface looks satisfactory. Using silver alginate Silber, Aayush E ([id number]) [id number] Page 11 of 40 Left heel continues to improve using silver collagen The bilateral dorsal foot areas which are at the base of his first and second toes/possible tinea pedis are actually stable on the left but worse on the right. The area on the left required debridement of necrotic surface. After debridement  I obtained a specimen for PCR culture. The right dorsal foot which is been just about healed last week is now reopened 08/28/2018; culture done on the left dorsal foot showed coag negative staph both staph epidermidis and Lugdunensis. I think this is worthwhile initiating systemic treatment. I will use doxycycline given his long list of allergies. The area on the left heel slightly improved but still requiring debridement. The large wound on the buttock is just about closed whereas the smaller one is larger. Using silver alginate in this area 09/04/2018; patient is  completing his doxycycline for the left foot although this continues to be a very difficult wound area with very adherent necrotic debris. We are using silver alginate to all his wounds right foot left foot and the small wounds on his buttock, silver collagen on the left heel. 09/11/2018; once again this patient has intense erythema and swelling of the left forefoot. Lesser degrees of erythema in the right foot. He has a long list of allergies and intolerances. I will reinstitute doxycycline. 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 Using silver alginate Left heel also looks better using silver collagen Unfortunately both the areas on his feet look worse. The area on the left first second webspace is now gone through to the plantar part of his foot. The area on the left foot anteriorly is irritated with erythema and swelling in the forefoot. 09/25/2018 His wound on the left plantar heel looks better. Using silver collagen The area on the left buttock 2 small remnant areas. One is closed one is still open. Using silver alginate The areas between both his first and second toes look worse. This in spite of long-standing antifungal therapy with ketoconazole and silver alginate which should have antifungal activity He has small areas around his original wound on the left calf one is on the bottom of the original scar tissue and one superiorly both of these are small and superficial but again given wound history in this site this is worrisome 10/02/2018 Left plantar heel continues to gradually contract using silver collagen Left buttock wound is unchanged using silver alginate The areas on his dorsal feet between his first and second toes bilaterally look about the same. I prescribed clindamycin ointment to see if we can address chronic staph colonization and also the underlying possibility of erythrasma The left lateral lower extremity wound is actually  on the lateral part of his ankle. Small open area here. We have been using silver alginate 10/09/2018; Left plantar heel continues to look healthy and contract. No debridement is required Left buttock slightly smaller with a tape injury wound just below which was new this week Dorsal feet somewhat improved I have been using clindamycin Left lateral looks lower extremity the actual open area looks worse although a lot of this is epithelialized. I am going to change to silver collagen today He has a lot more swelling in the right leg although this is not pitting not red and not particularly warm there is a lot of spasm in the right leg usually indicative of people with paralysis of some underlying discomfort. We have reviewed his vascular status from 2017 he had a left greater saphenous vein ablation. I wonder about referring him back to vascular surgery if the area on the left leg continues to deteriorate. 10/16/2018 in today for follow-up and management of multiple lower extremity ulcers. His left Buttock wound is much lower smaller and  almost closed completely. The wound to the left ankle has began to reopen with Epithelialization and some adherent slough. He has multiple new areas to the left foot and leg. The left dorsal foot without much improvement. Wound present between left great webspace and 2nd toe. Erythema and edema present right leg. Right LE ultrasound obtained on 10/10/18 was negative for DVT . 10/23/2018; Left buttock is closed over. Still dry macerated skin but there is no open wound. I suspect this is chronic pressure/moisture Left lateral calf is quite a bit worse than when I saw this last. There is clearly drainage here he has macerated skin into the left plantar heel. We will change the primary dressing to alginate Left dorsal foot has some improvement in overall wound area. Still using clindamycin and silver alginate Right dorsal foot about the same as the left using  clindamycin and silver alginate The erythema in the right leg has resolved. He is DVT rule out was negative Left heel pressure area required debridement although the wound is smaller and the surface is health 10/26/2018 The patient came back in for his nurse check today predominantly because of the drainage coming out of the left lateral leg with a recent reopening of his original wound on the left lateral calf. He comes in today with a large amount of surrounding erythema around the wound extending from the calf into the ankle and even in the area on the dorsal foot. He is not systemically unwell. He is not febrile. Nevertheless this looks like cellulitis. We have been using silver alginate to the area. I changed him to a regular visit and I am going to prescribe him doxycycline. The rationale here is a long list of medication intolerances and a history of MRSA. I did not see anything that I thought would provide a valuable culture 10/30/2018 Follow-up from his appointment 4 days ago with really an extensive area of cellulitis in the left calf left lateral ankle and left dorsal foot. I put him on doxycycline. He has a long list of medication allergies which are true allergy reactions. 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 In any case he is a lot better today. The erythema especially in the anterior and lateral left calf is better. He still has left ankle erythema. He also is complaining about increasing edema in the right leg we have only been using Kerlix Coban and he has been doing the wraps at home. Finally he has a spotty rash on the medial part of his upper left calf which looks like folliculitis or perhaps wrap occlusion type injury. Small superficial macules not pustules 11/06/18 patient arrives today with again a considerable degree of erythema around the wound on the left lateral calf extending into the dorsal ankle and  dorsal foot. This is a lot worse than when I saw this last week. He is on doxycycline really with not a lot of improvement. He has not been systemically unwell Wounds on the; left heel actually looks improved. Original area on the left foot and proximity to the first and second toes looks about the same. He has superficial areas on the dorsal foot, anterior calf and then the reopening of his original wound on the left lateral calf which looks about the same The only area he has on the right is the dorsal webspace first and second which is smaller. He has a large area of dry erythematous skin on  the left buttock small open area here. 11/13/2018; the patient arrives in much better condition. The erythema around the wound on the left lateral calf is a lot better. Not sure whether this was the clindamycin or the TCA and ketoconazole or just in the improvement in edema control [stasis dermatitis]. In any case this is a lot better. The area on the left heel is very small and just about resolved using silver collagen we have been using silver alginate to the areas on his dorsal feet 11/20/2018; his wounds include the left lateral calf, left heel, dorsal aspects of both feet just proximal to the first second webspace. He is stable to slightly improved. I did not think any changes to his dressings were going to be necessary 11/27/2018 he has a reopening on the left buttock which is surrounded by what looks like tinea or perhaps some other form of dermatitis. The area on the left dorsal foot has some erythema around it I have marked this area but I am not sure whether this is cellulitis or not. Left heel is not closed. Left calf the reopening is really slightly longer and probably worse 1/13; in general things look better and smaller except for the left dorsal foot. Area on the left heel is just about closed, left buttock looks better only a small wound remains in the skin looks better [using Lotrisone] 1/20;  the area on the left heel only has a few remaining open areas here. Left lateral calf about the same in terms of size, left dorsal foot slightly larger right lateral foot still not closed. The area on the left buttock has no open wound and the surrounding skin looks a lot better 1/27; the area on the left heel is closed. Left lateral calf better but still requiring extensive debridements. The area on his left buttock is closed. He still has the open areas on the left dorsal foot which is slightly smaller in the right foot which is slightly expanded. We have been using Iodoflex on these areas as well 2/3; left heel is closed. Left lateral calf still requiring debridement using Iodoflex there is no open area on his left buttock however he has dry scaly skin over a large area of this. Not really responding well to the Lotrisone. Finally the areas on his dorsal feet at the level of the first second webspace are slightly smaller on the right and about the same on the left. Both of these vigorously debrided with Anasept and gauze Caprara, Tremaine E ([id number]) [id number] Page 12 of 40 2/10; left heel remains closed he has dry erythematous skin over the left buttock but there is no open wound here. Left lateral leg has come in and with. Still requiring debridement we have been using Iodoflex here. Finally the area on the left dorsal foot and right dorsal foot are really about the same extremely dry callused fissured areas. He does not yet have a dermatology appointment 2/17; left heel remains closed. He has a new open area on the left buttock. The area on the left lateral calf is bigger longer and still covered in necrotic debris. No major change in his foot areas bilaterally. I am awaiting for a dermatologist to look on this. We have been using ketoconazole I do not know that this is been doing any good at all. 2/24; left heel remains closed. The left buttock wound that was new  reopening last week looks better. The left lateral calf appears better also although still  requires debridement. The major area on his foot is the left first second also requiring debridement. We have been putting Prisma on all wounds. I do not believe that the ketoconazole has done too much good for his feet. He will use Lotrisone I am going to give him a 2-week course of terbinafine. We still do not have a dermatology appointment 3/2 left heel remains closed however there is skin over bone in this area I pointed this out to him today. The left buttock wound is epithelialized but still does not look completely stable. The area on the left leg required debridement were using silver collagen here. With regards to his feet we changed to Lotrisone last week and silver alginate. 3/9; left heel remains closed. Left buttock remains closed. The area on the right foot is essentially closed. The left foot remains unchanged. Slightly smaller on the left lateral calf. Using silver collagen to both of these areas 3/16-Left heel remains closed. Area on right foot is closed. Left lateral calf above the lateral malleolus open wound requiring debridement with easy bleeding. Left dorsal wound proximal to first toe also debrided. Left ischial area open new. Patient has been using Prisma with wrapping every 3 days. Dermatology appointment is apparently tomorrow.Patient has completed his terbinafine 2-week course with some apparent improvement according to him, there is still flaking and dry skin in his foot on the left 3/23; area on the right foot is reopened. The area on the left anterior foot is about the same still a very necrotic adherent surface. He still has the area on the left leg and reopening is on the left buttock. He apparently saw dermatology although I do not have a note. According to the patient who is usually fairly well informed they did not have any good ideas. Put him on oral terbinafine which she is  been on before. 3/30; using silver collagen to all wounds. Apparently his dermatologist put him on doxycycline and rifampin presumably some culture grew staph. I do not have this result. He remains on terbinafine although I have used terbinafine on him before 4/6; patient has had a fairly substantial reopening on the right foot between the first and second toes. He is finished his terbinafine and I believe is on doxycycline and rifampin still as prescribed by dermatology. We have been using silver collagen to all his wounds although the patient reports that he thinks silver alginate does better on the wounds on his buttock. 4/13; the area on his left lateral calf about the same size but it did not require debridement. Left dorsal foot just proximal to the webspace between the first and second toes is about the same. Still nonviable surface. I note some superficial bronze discoloration of the dorsal part of his foot Right dorsal foot just proximal to the first and second toes also looks about the same. I still think there may be the same discoloration I noted above on the left Left buttock wound looks about the same 4/20; left lateral calf appears to be gradually contracting using silver collagen. 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 The left dorsal foot wound is debrided of tightly adherent necrotic debris and really cleans up quite nicely. The right area is worse with expansion. I did not debride this it is now over the base of the second toe The area on his left buttock is smaller no debridement is required using silver collagen 5/4; left calf continues to make good  progress. He arrives with erythema around the wounds on his dorsal foot which even extends to the plantar aspect. Very concerning for coexistent infection. He is finished the erythromycin I gave him for possible erythrasma this does not seem to have helped. The area on the  left foot is about the same base of the dorsal toes Is area on the buttock looks improved on the left 5/11; left calf and left buttock continued to make good progress. Left foot is about the same to slightly improved. Major problem is on the right foot. He has not had an x-ray. Deep tissue culture I did last week showed both Enterobacter and E. coli. I did not change the doxycycline I put him on empirically although neither 1 of these were plated to doxycycline. He arrives today with the erythema looking worse on both the dorsal and plantar foot. Macerated skin on the bottom of the foot. he has not been systemically unwell 5/18-Patient returns at 1 week, left calf wound appears to be making some progress, left buttock wound appears slightly worse than last time, left foot wound looks slightly better, right foot redness is marginally better. X-ray of both feet show no air or evidence of osteomyelitis. Patient is finished his Omnicef and terbinafine. He continues to have macerated skin on the bottom of the left foot as well as right 5/26; left calf wound is better, left buttock wound appears to have multiple small superficial open areas with surrounding macerated skin. X-rays that I did last time showed no evidence of osteomyelitis in either foot. He is finished cefdinir and doxycycline. I do not think that he was on terbinafine. He continues to have a large superficial open area on the right foot anterior dorsal and slightly between the first and second toes. I did send him to dermatology 2 months ago or so wondering about whether they would do a fungal scraping. I do not believe they did but did do a culture. We have been using silver alginate to the toe areas, he has been using antifungals at home topically either ketoconazole or Lotrisone. We are using silver collagen on the left foot, silver alginate on the right, silver collagen on the left lateral leg and silver alginate on the left buttock 6/1;  left buttock area is healed. We have the left dorsal foot, left lateral leg and right dorsal foot. We are using silver alginate to the areas on both feet and silver collagen to the area on his left lateral calf 6/8; the left buttock apparently reopened late last week. He is not really sure how this happened. He is tolerating the terbinafine. Using silver alginate to all wounds 6/15; left buttock wound is larger than last week but still superficial. Came in the clinic today with a report of purulence from the left lateral leg I did not identify any infection Both areas on his dorsal feet appear to be better. He is tolerating the terbinafine. Using silver alginate to all wounds 6/22; left buttock is about the same this week, left calf quite a bit better. His left foot is about the same however he comes in with erythema and warmth in the right forefoot once again. Culture that I gave him in the beginning of May showed Enterobacter and E. coli. I gave him doxycycline and things seem to improve although neither 1 of these organisms was specifically plated. 6/29; left buttock is larger and dry this week. Left lateral calf looks to me to be improved. Left dorsal  foot also somewhat improved right foot completely unchanged. The erythema on the right foot is still present. He is completing the Ceftin dinner that I gave him empirically [see discussion above.) 7/6 - All wounds look to be stable and perhaps improved, the left buttock wound is slightly smaller, per patient bleeds easily, completed ceftin, the right foot redness is less, he is on terbinafine 7/13; left buttock wound about the same perhaps slightly narrower. Area on the left lateral leg continues to narrow. Left dorsal foot slightly smaller right foot about the same. We are using silver alginate on the right foot and Hydrofera Blue to the areas on the left. Unna boot on the left 2 layer compression on the right 7/20; left buttock wound absolutely  the same. Area on lateral leg continues to get better. Left dorsal foot require debridement as did the right no major change in the 7/27; left buttock wound the same size necrotic debris over the surface. The area on the lateral leg is closed once again. His left foot looks better right foot about the same although there is some involvement now of the posterior first second toe area. He is still on terbinafine which I have given him for a month, not certain a centimeter major change 06/25/19-All wounds appear to be slightly improved according to report, left buttock wound looks clean, both foot wounds have minimal to no debris the right dorsal foot has minimal slough. We are using Hydrofera Blue to the left and silver alginate to the right foot and ischial wound. 8/10-Wounds all appear to be around the same, the right forefoot distal part has some redness which was not there before, however the wound looks clean and small. Ischial wound looks about the same with no changes 8/17; his wound on the left lateral calf which was his original chronic venous insufficiency wound remains closed. Since I last saw him the areas on the left dorsal foot right dorsal foot generally appear better but require debridement. The area on his left initial tuberosity appears somewhat larger to me perhaps hyper granulated and bleeds very easily. We have been using Hydrofera Blue to the left dorsal foot and silver alginate to everything else 8/24; left lateral calf remains closed. The areas on his dorsal feet on the webspace of the first and second toes bilaterally both look better. The area on the left buttock which is the pressure ulcer stage II slightly smaller. I change the dressing to Hydrofera Blue to all areas 8/31; left lateral calf remains closed. The area on his dorsal feet bilaterally look better. Using Hydrofera Blue. Still requiring debridement on the left foot. No Joo, Erique E ([id number])  [id number] Page 13 of 40 change in the left buttock pressure ulcers however 9/14; left lateral calf remains closed. Dorsal feet look quite a bit better than 2 weeks ago. Flaking dry skin also a lot better with the ammonium lactate I gave him 2 weeks ago. The area on the left buttock is improved. He states that his Roho cushion developed a leak and he is getting a new one, in the interim he is offloading this vigorously 9/21; left calf remains closed. Left heel which was a possible DTI looks better this week. He had macerated tissue around the left dorsal foot right foot looks satisfactory and improved left buttock wound. I changed his dressings to his feet to silver alginate bilaterally. Continuing Hydrofera Blue on the left buttock. 9/28 left calf remains closed. Left heel did not develop anything [  possible DTI] dry flaking skin on the left dorsal foot. Right foot looks satisfactory. Improved left buttock wound. We are using silver alginate on his feet Hydrofera Blue on the buttock. I have asked him to go back to the Lotrisone on his feet including the wounds and surrounding areas 10/5; left calf remains closed. The areas on the left and right feet about the same. A lot of this is epithelialized however debris over the remaining open areas. He is using Lotrisone and silver alginate. The area on the left buttock using Hydrofera Blue 10/26. Patient has been out for 3 weeks secondary to Covid concerns. He tested negative but I think his wife tested positive. He comes in today with the left foot substantially worse, right foot about the same. Even more concerning he states that the area on his left buttock closed over but then reopened and is considerably deeper in one aspect than it was before [stage III wound] 11/2; left foot really about the same as last week. Quarter sized wound on the dorsal foot just proximal to the first second toes. Surrounding erythema with areas  of denuded epithelium. This is not really much different looking. Did not look like cellulitis this time however. Right foot area about the same.. We have been using silver alginate alginate on his toes Left buttock still substantial irritated skin around the wound which I think looks somewhat better. We have been using Hydrofera Blue here. 11/9; left foot larger than last week and a very necrotic surface. Right foot I think is about the same perhaps slightly smaller. Debris around the circumference also addressed. Unfortunately on the left buttock there is been a decline. Satellite lesions below the major wound distally and now a an additional one posteriorly we have been using Hydrofera Blue but I think this is a pressure issue 11/16; left foot ulcer dorsally again a very adherent necrotic surface. Right foot is about the same. Not much change in the pressure ulcer on his left buttock. 11/30; left foot ulcer dorsally basically the same as when I saw him 2 weeks ago. Very adherent fibrinous debris on the wound surface. Patient reports a lot of drainage as well. 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 We have been using Iodoflex, patient changed back to alginate because of the drainage. Area on his right dorsal foot really looks benign with a healthier surface certainly a lot better than on the left. Left buttock wounds all improved using Hydrofera Blue 12/7; left dorsal foot again no improvement. Tightly adherent debris. PCR culture I did last week only showed likely skin contaminant. I have gone ahead and done a punch biopsy of this which is about the last thing in terms of investigations I can think to do. He has known venous insufficiency and venous hypertension and this could be the issue here. The area on the right foot is about the same left buttock slightly worse according to our intake nurse secondary to [facility] Blue sticking to the  wound 12/14; biopsy of the left foot that I did last time showed changes that could be related to wound healing/chronic stasis dermatitis phenomenon no neoplasm. We have been using silver alginate to both feet. I change the one on the left today to Sorbact and silver alginate to his other 2 wounds 12/28; the patient arrives with the following problems; Major issue is the dorsal left foot which continues to be a larger deeper wound area. Still with  a completely nonviable surface Paradoxically the area mirror image on the right on the right dorsal foot appears to be getting better. He had some loss of dry denuded skin from the lower part of his original wound on the left lateral calf. Some of this area looked a little vulnerable and for this reason we put him in wrap that on this side this week The area on his left buttock is larger. He still has the erythematous circular area which I think is a combination of pressure, sweat. This does not look like cellulitis or fungal dermatitis 11/26/2019; -Dorsal left foot large open wound with depth. Still debris over the surface. Using Sorbact The area on the dorsal right foot paradoxically has closed over He has a reopening on the left ankle laterally at the base of his original wound that extended up into the calf. This appears clean. The left buttock wound is smaller but with very adherent necrotic debris over the surface. We have been using silver alginate here as well The patient had arterial studies done in 2017. He had biphasic waveforms at the dorsalis pedis and posterior tibial bilaterally. ABI in the left was 1.17. Digit waveforms were dampened. He has slight spasticity in the great toes I do not think a TBI would be possible 1/11; the patient comes in today with a sizable reopening between the first and second toes on the right. This is not exactly in the same location where we have been treating wounds previously. According to our intake nurse this  was actually fairly deep but 0.6 cm. The area on the left dorsal foot looks about the same the surface is somewhat cleaner using Sorbact, his MRI is in 2 days. We have not managed yet to get arterial studies. The new reopening on the left lateral calf looks somewhat better using alginate. The left buttock wound is about the same using alginate 1/18; the patient had his ARTERIAL studies which were quite normal. ABI in the right at 1.13 with triphasic/biphasic waveforms on the left ABI 1.06 again with triphasic/biphasic waveforms. It would not have been possible to have done a toe brachial index because of spasticity. We have been using Sorbac to the left foot alginate to the rest of his wounds on the right foot left lateral calf and left buttock 1/25; arrives in clinic with erythema and swelling of the left forefoot worse over the first MTP area. This extends laterally dorsally and but also posteriorly. Still has an area on the left lateral part of the lower part of his calf wound it is eschared and clearly not closed. Area on the left buttock still with surrounding irritation and erythema. Right foot surface wound dorsally. The area between the right and first and second toes appears better. 2/1; The left foot wound is about the same. Erythema slightly better I gave him a week of doxycycline empirically Right foot wound is more extensive extending between the toes to the plantar surface Left lateral calf really no open surface on the inferior part of his original wound however the entire area still looks vulnerable Absolutely no improvement in the left buttock wound required debridement. 2/8; the left foot is about the same. Erythema is slightly improved I gave him clindamycin last week. Right foot looks better he is using Lotrimin and silver alginate He has a breakdown in the left lateral calf. Denuded epithelium which I have removed Left buttock about the same were using Hydrofera Blue 2/15;  left foot is about  the same there is less surrounding erythema. Surface still has tightly adherent debris which I have debriding however not making any progress Right foot has a substantial wound on the medial right second toe between the first and second webspace. Still an open area on the left lateral calf distal area. Buttock wound is about the same 2/22; left foot is about the same less surrounding erythema. Surface has adherent debris. Polymen Ag Right foot area significant wound between the first and second toes. We have been using silver alginate here Left lateral leg polymen Ag at the base of his original venous insufficiency wound Left buttock some improvement here 3/1; Right foot is deteriorating in the first second toe webspace. Larger and more substantial. We have been using silver alginate. Left dorsal foot about the same markedly adherent surface debris using PolyMem Ag Left lateral calf surface debris using PolyMem AG Left buttock is improved again using PolyMem Ag. He is completing his terbinafine. The erythema in the foot seems better. He has been on this for 2 weeks 3/8; no improvement in any wound area in fact he has a small open area on the dorsal midfoot which is new this week. He has not gotten his foot x-rays yet 3/15; his x-rays were both negative for osteomyelitis of both feet. No major change in any of his wounds on the extremities however his buttock wounds are better. We have been using polymen on the buttocks, left lower leg. Iodoflex on the left foot and silver alginate on the right 3/22; arrives in clinic today with the 2 major issues are the improvement in the left dorsal foot wound which for once actually looks healthy with a nice healthy Caswell, Airik E ([id number]) [id number] Page 14 of 40 wound surface without debridement. Using Iodoflex here. Unfortunately on the left lateral calf which is in the distal part of his original wound he  came to the clinic here for there was purulent drainage noted some increased breakdown scattered around the original area and a small area proximally. We we are using polymen here will change to silver alginate today. His buttock wound on the left is better and I think the area on the right first second toe webspace is also improved 3/29; left dorsal foot looks better. Using Iodoflex. Left ankle culture from deterioration last time grew E. coli, Enterobacter and Enterococcus. I will give him a course of cefdinir although that will not cover Enterococcus. The area on the right foot in the webspace of the first and second toe lateral first toe looks better. The area on his buttock is about healed Vascular appointment is on April 21. This is to look at his venous system vis--vis continued breakdown of the wounds on the left including the left lateral leg and left dorsal foot he. He has had previous ablations on this side 4/5; the area between the right first and second toes lateral aspect of the first toe looks better. Dorsal aspect of the left first toe on the left foot also improved. Unfortunately the left lateral lower leg is larger and there is a second satellite wound superiorly. The usual superficial abrasions on the left buttock overall better but certainly not closed 4/12; the area between the right first and second toes is improved. Dorsal aspect of the left foot also slightly smaller with a vibrant healthy looking surface. No real change in the left lateral leg and the left buttock wound is healed He has an unaffordable co-pay for Apligraf. Appointment with  vein and vascular with regards to the left leg venous part of the circulation is on 4/21 4/19; we continue to see improvement in all wound areas. Although this is minor. He has his vascular appointment on 4/21. The area on the left buttock has not reopened although right in the center of this area the skin looks somewhat threatened 4/26;  the left buttock is unfortunately reopened. In general his left dorsal foot has a healthy surface and looks somewhat smaller although it was not measured as such. The area between his first and second toe webspace on the right as a small wound against the first toe. The patient saw vascular surgery. The real question I was asking was about the small saphenous vein on the left. He has previously ablated left greater saphenous vein. Nothing further was commented on on the left. Right greater saphenous vein without reflux at the saphenofemoral junction or proximal thigh there was no indication for ablation of the right greater saphenous vein duplex was negative for DVT bilaterally. They did not think there was anything from a vascular surgery point of view that could be offered. They ABIs within normal limits 5/3; only small open area on the left buttock. The area on the left lateral leg which was his original venous reflux is now 2 wounds both which look clean. We are using Iodoflex on the left dorsal foot which looks healthy and smaller. He is down to a very tiny area between the right first and second toes, using silver alginate 5/10; all of his wounds appear better. We have much better edema control in 4 layer compression on the left. This may be the factor that is allowing the left foot and left lateral calf to heal. He has external compression garments at home 04/14/20-All of his wounds are progressing well, the left forefoot is practically closed, left ischium appears to be about the same, right toe webspace is also smaller. The left lateral leg is about the same, continue using Hydrofera Blue to this, silver alginate to the ischium, Iodoflex to the toe space on the right 6/7; most of his wounds outside of the left buttock are doing well. The area on the left lateral calf and left dorsal foot are smaller. The area on the right foot in between the first and second toe webspace is barely visible  although he still says there is some drainage here is the only reason I did not heal this out. Unfortunately the area on the left buttock almost looks like he has a skin tear from tape. He has open wound and then a large flap of skin that we are trying to get adherence over an area just next to the remaining wound 6/21; 2 week follow-up. I believe is been here for nurse visits. Miraculously the area between his first and second toes on the left dorsal foot is closed over. Still open on the right first second web space. The left lateral calf has 2 open areas. Distally this is more superficial. The proximal area had a little more depth and required debridement of adherent necrotic material. His buttock wound is actually larger we have been using silver alginate here 6/28; the patient's area on the left foot remains closed. Still open wet area between the first and second toes on the right and also extending into the plantar aspect. We have been using silver alginate in this location. He has 2 areas on the left lower leg part of his original long wounds which  I think are better. We have been using Hydrofera Blue here. Hydrofera Blue to the left buttock which is stable 7/12; left foot remains closed. Left ankle is closed. May be a small area between his right first and second toes the only truly open area is on the left buttock. We have been using Hydrofera Blue here 7/19; patient arrives with marked deterioration especially in the left foot and ankle. We did not put him in a compression wrap on the left last week in fact he wore his juxta lite stockings on either side although he does not have an underlying stocking. He has a reopening on the left dorsal foot, left lateral ankle and a new area on the right dorsal ankle. More worrisome is the degree of erythema on the left foot extending on the lateral foot into the lateral lower leg on the left 7/26; the patient had erythema and drainage from the lateral  left ankle last week. Culture of this grew MRSA resistant to doxycycline and clindamycin which are the 2 antibiotics we usually use with this patient who has multiple antibiotic allergies including linezolid, trimethoprim sulfamethoxazole. I had give him an empiric doxycycline and he comes in the area certainly looks somewhat better although it is blotchy in his lower leg. He has not been systemically unwell. He has had areas on the left dorsal foot which is a reopening, chronic wounds on the left lateral ankle. Both of these I think are secondary to chronic venous insufficiency. The area between his first and second toes is closed as far as I can tell. He had a new wrap injury on the right dorsal ankle last week. Finally he has an area on the left buttock. We have been using silver alginate to everything except the left buttock we are using Hydrofera Blue 06/30/20-Patient returns at 1 week, has been given a sample dose pack of NUZYRA which is a tetracycline derivative [omadacycline], patient has completed those, we have been using silver alginate to almost all the wounds except the left ischium where we are using Hydrofera Blue all of them look better 8/16; since I last saw the patient he has been doing well. The area on the left buttock, left lateral ankle and left foot are all closed today. He has completed the [place] I gave him last time and tolerated this well. He still has open areas on the right dorsal ankle and in the right first second toe area which we are using silver alginate. 8/23; we put him in his bilateral external compression stockings last week as he did not have anything open on either leg except for concerning area between the right first and second toe. He comes in today with an area on the left dorsal foot slightly more proximal than the original wound, the left lateral foot but this is actually a continuation of the area he had on the left lateral ankle from last time. As well he is  opened up on the left buttock again. 8/30; comes in today with things looking a lot better. The area on the left lower ankle has closed down as has the left foot but with eschar in both areas. The area on the dorsal right ankle is also epithelialized. Very little remaining of the left buttock wound. We have been using silver alginate on all wound areas 9/13; the area in the first second toe webspace on the right has fully epithelialized. He still has some vulnerable epithelium on the right and the ankle  and the dorsal foot. He notes weeping. He is using his juxta lite stocking. On the left again the left dorsal foot is closed left lateral ankle is closed. We went to the juxta lite stocking here as well. Still vulnerable in the left buttock although only 2 small open areas remain here 9/27; 2-week follow-up. We did not look at his left leg but the patient says everything is closed. He is a bit disturbed by the amount of edema in his left foot he is using juxta lite stockings but asking about over the toes stockings which would be 30/40, will talk to him next time. According to him there is no open wound on either the left foot or the left ankle/calf He has an open area on the dorsal right calf which I initially point a wrap injury. He has superficial remaining wound on the left ischial tuberosity been using silver alginate although he says this sticks to the wound 10/5; we gave him 2-week follow-up but he called yesterday expressing some concerns about his right foot right ankle and the left buttock. He came in early. There is still no open areas on the left leg and that still in his juxta lite stocking 10/11; he only has 1 small area on the left buttock that remains measuring millimeters 1 mm. Still has the same irritated skin in this area. We recommended zinc oxide when this eventually closes and pressure relief is meticulously is he can do this. He still has an area on the dorsal part of his right  first through third toes which is a bit irritated and still open and on the dorsal ankle near the crease of the ankle. We have been using silver alginate and using his own stocking. He has nothing open on the left leg or foot 10/25; 2-week follow-up. Not nearly as good on the left buttock as I was hoping. For open areas with 5 looking threatened small. He has the erythematous irritated chronic skin in this area. 1 area on the right dorsal ankle. He reports this area bleeds easily Right dorsal foot just proximal to the base of his toes We have been using silver alginate. 11/8; 2-week follow-up. Left buttock is about the same although I do not think the wounds are in the same location we have been using silver alginate. I have asked him to use zinc oxide on the skin around the wounds. He still has a small area on the right dorsal ankle he reports this bleeds easily Right dorsal foot just proximal to the base of the toes does not have anything open although the skin is very dry and scaly He has a new opening on the nailbed of the left great toe. Nothing on the left ankle Laris, Ulmer E ([id number]) [id number] Page 15 of 40 11/29; 3-week follow-up. Left buttock has 2 open areas. And washing of these wounds today started bleeding easily. Suggesting very friable tissue. We have been using silver alginate. Right dorsal ankle which I thought was initially a wrap injury we have been using silver alginate. Nothing open between the toes that I can see. He states the area on the left dorsal toe nailbed healed after the last visit in 2 or 3 days 12/13; 3-week follow-up. His left buttock now has 3 open areas but the original 2 areas are smaller using polymen here. Surrounding skin looks better. The right dorsal ankle is closed. He has a small opening on the right dorsal foot at the level of  the third toe. In general the skin looks better here. He is wearing his juxta lite stocking on  the left leg says there is nothing open 11/24/2020; 3 weeks follow-up. His left buttock still has the 3 open areas. We have been using polymen but due to lack of response he changed to [facility] area. Surrounding skin is dry erythematous and irritated looking. There is no evidence of infection either bacterial or fungal however there is loss of surface epithelium He still has very dry skin in his foot causing irritation and erythema on the dorsal part of his toes. This is not responded to prolonged courses of antifungal simply looks dry and irritated 1/24; left buttock area still looks about the same he was unable to find the triad ointment that we had suggested. The area on the right lower leg just above the dorsal ankle has reopened and the areas on the right foot between the first second and second third toes and scaling on the bottom of the foot has been about the same for quite some time now. been using silver alginate to all wound areas 2/7; left buttock wound looked quite good although not much smaller in terms of surface area surrounding skin looks better. Only a few dry flaking areas on the right foot in between the first and second toes the skin generally looks better here [ammonium lactate]. Finally the area on the right dorsal ankle is closed 2/21; There is no open area on the right foot even between the right first and second toe. Skin around this area dorsally and plantar aspects look better. He has a reopening of the area on the right ankle just above the crease of the ankle dorsally. I continue to think that this is probably friction from spasms may be even this time with his stocking under the compression stockings. Wounds on his left buttock look about the same there a couple of areas that have reopened. He has a total square area of loss of epithelialization. This does not look like infection it looks like a contact dermatitis but I just cannot determine to what 3/14; there is  nothing on the right foot between the first and second toes this was carefully inspected under illumination. Some chronic irritation on the dorsal part of his foot from toes 1-3 at the base. Nothing really open here substantially. Still has an area on the right foot/ankle that is actually larger and hyper granulated. His buttock area on the left is just about closed however he has chronic inflammation with loss of the surface epithelial layer 3/28; 2-week follow-up. In clinic today with a new wound on the left anterior mid tibia. Says this happened about 2 weeks ago. He is not really sure how wonders about the spasticity of his legs at night whether that could have caused this other than that he does not have a good idea. He has been using topical antibiotics and silver alginate. The area on his right dorsal ankle seems somewhat better. Finally everything on his left buttock is closed. 4/11; 2-week follow-up. All of his wounds are better except for the area over the ischium and left buttock which have opened up widely again. At least part of this is covered in necrotic fibrinous material another part had rolled nonviable skin. The area on the right ankle, left anterior mid tibia are both a lot better. He had no open wounds on either foot including the areas between the first and second toes 4/25; patient presents  for 2-week follow-up. He states that the wounds are overall stable. He has no complaints today and states he is using Hydrofera Blue to open wounds. 5/9; have not seen this man in over a month. For my memory he has open areas on the left mid tibia and right ankle. T oday he has new open area on the right dorsal foot which we have not had a problem with recently. He has the sustained area on the left buttock He is also changed his insurance at the beginning of the year [employer]. We will need prior authorizations for debridement 5/23; patient presents for 2-week follow-up. He has prior  authorizations for debridement. He denies any issues in the past 2 weeks with his wound care. He has been using Hydrofera Blue to all the wounds. He does report a circular rash to the upper left leg that is new. He denies acute signs of infection. 6/6; 2-week follow-up. The patient has open wounds on the left buttock which are worse than the last time I saw this about a month ago. He also has a new area to me on the left anterior mid tibia with some surrounding erythema. The area on the dorsal ankle on the right is closed but I think this will be a friction injury every time this area is exposed to either our wraps or his compression stockings caused by unrelenting spasms in this leg. 6/20; 2-week follow-up. The patient has open wounds on the left buttock which is about the same. Using [facility] here. - The left mid tibia has a static amount of surrounding erythema. Also a raised area in the center. We have been using Hydrofera Blue here. Finally he has broken down in his dorsal right foot extending between the first and second toes and going to the base of the first and second toe webspace. I have previously assumed that this was severe venous hypertension 7/5; 2-week follow-up The left buttock wound actually looks better. We are using Hydrofera Blue. He has extensive skin irritation around this area and I have not really been able to get that any better. I have tried Lotrisone i.e. antifungals and steroids. More most recently we have just been using Coloplast really looks about the same. The left mid tibia which was new last week culture to have very resistant staph aureus. Not only methicillin-resistant but doxycycline resistant. The patient has a plethora of antibiotic allergies including sulfa, linezolid. I used topical bacitracin on this but he has not started this yet. In addition he has an expanding area of erythema with a wound on the dorsal right foot. I did a deep tissue culture of  this area today 7/12; Left buttock area actually looks better surrounding skin also looks less irritated. Left mid tibia looks about the same. He is using bacitracin this is not worse Right dorsal foot looks about the same as well. The left first toe also looks about the same 7/19; left buttock wound continues to improve in terms of open areas Left mid tibia is still concerning amount of swelling he is using bacitracin Dorsal left first toe somewhat smaller Right dorsal foot somewhat smaller 7/25; left buttock wound actually continues to improve Left mid tibia area has less swelling. I gave him all my samples of new Nuzyra. This seems to have helped although the wound is still open it. His abrasion closed by here Left dorsal great toe really no better. Still a very nonviable surface Right dorsal foot perhaps some  better. We have been using bacitracin and silver nitrate to the areas on his lower legs and Hydrofera Blue to the area on the buttock. 8/16 Disappointed that his left buttock wound is actually more substantial. Apparently during the last nurse visit these were both very small. He has continued irritation to a large area of skin on his buttock. I have never been able to totally explain this although I think it some combination of the way he sits, pressure, moisture. He is not incontinent enough to contribute to this. Left dorsal great toe still fibrinous debris on the surface that I have debrided today Large area across the dorsal right toes. The area on the left anterior mid tibia has less swelling. He completed the [place]. This does not look infected although the tissue is still fried 8/30; 2-week follow-up. Left buttock areas not improved. We used Hydrofera Blue on this. Weeping wet with the surrounding erythema that I have not been able to control even with Lotrisone and topical Coloplast Left dorsal great toe looks about the same More substantial area again at the base of his  toes on the left which is new this week. Area across the dorsal right toes looks improved The left anterior mid tibia looks like it is trying to close 9/13; 2-week follow-up. Using silver alginate on all of his wounds. The left dorsal foot does not look any better. He has the area on the dorsal toe and also the areas at the base of all of his toes 1 through 3. On the right foot he has a similar pattern in a similar area. He has the area on his left mid tibia that looks fairly healthy. Finally the area on his left buttock looks somewhat bette 9/20; culture I did of the left foot which was a deep tissue culture last time showed E. coli he has erythema around this wound. Still a completely necrotic surface. His right dorsal foot looks about the same. He has a very friable surface to the left anterior mid tibia. Both buttock wounds look better. We have been Faulks, Macoy E ([id number]) [id number] Page 16 of 40 using silver alginate to all wounds 10/4; he has completed the cephalexin that I gave him last time for the left foot. He is using topical gentamicin under silver alginate silver alginate being applied to all the wounds. Unfortunately all the wounds look irritated on his dorsal right foot dorsal left foot left mid tibia. I wonder if this could be a silver allergy. I am going to change him to [facility] on the lower extremity. The skin on the left buttock and left posterior thigh still flaking dry and irritated. This has continued no matter what I have applied topically to this. He has a solitary open wound which by itself does not look too bad however the entire area of surrounding skin does not change no matter what we have applied here 10/18; the area on the left dorsal foot and right dorsal foot both look better. The area on the right extends into the plantar but not between his toes. We have been using silver alginate. He still has a rectangular erythematous area  around the area on the left tibia. The wound itself is very small. Finally everything on his left buttock looks a little larger the skin is erythematous 11/15; patient comes in with the left dorsal and right dorsal foot distally looking somewhat better. Still nonviable surface on the left foot which required debridement. He still has  the area on the left anterior mid tibia although this looks somewhat better. He has a new area on the right lateral lower leg just above the ankle. Finally his left buttock looks terrible with multiple superficial open wounds geometric square shaped area of chronic erythema which I have not been able to sort through 11/29; right dorsal foot and left dorsal foot both look somewhat better. No debridement required. He has the fragile area on the left anterior mid tibia this looks and continues to look somewhat better. Right lateral lower leg just above the ankle we identified last time also looks better. In general the area on his left buttock looks improved. We are using Hydrofera Blue to all wound areas 12/13; right dorsal foot looks better. The area on the right lateral leg is healed. Left dorsal foot has 2 open areas both of which require debridement. The fragile area on the left anterior mid tibial looks better. Smaller area on his buttocks. Were using Hydrofera Blue 1/10; patient comes in with everything looking slightly larger and/or worse. This includes his left buttock, reopening of the left mid tibia, larger areas on the left dorsal foot and what looks to be a cellulitis on the right dorsal and plantar foot. We have been using Hydrofera Blue on all wounds. 1/17; right dorsal foot distally looks better today. The left foot has 2 open wounds that are about the same surrounding erythema. 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 No  malignancy they did not stain for fungus 1/24; his right distal foot is not closed dry and scaly but the wound looks like it is contracting. I did not debride anything here. Problem on his left dorsal foot with expanding erythema. Apparently there were problems last week getting the Riki Altes however it is now available at the [facility] [facility] but a week later. He is using ketoconazole and Coloplast to the left buttock along with Hydrofera Blue this actually looks quite a bit better today. 1/31; right dorsal foot again is dry and scaly but looking to contract. He has been using a moisturizer on his feet at my request but he is not sure which 1. The left dorsal foot wounds look about the same there is erythema here that I marked last week however after course of Nuzyra it certainly is not any better but not any worse either. Finally on his left buttock the skin continues to look better he has the original wound but a new substantial area towards the gluteal cleft. Almost like a skin tear. I used scissors to remove skin and subcutaneous tissue here silver nitrate and direct pressure 2/7; right dorsal foot. This does not look too much different from last week. Some erythema skin dry and scaly. No debridement. Left dorsal great toe again still not much improvement. I did remove flaking dry skin and callus from around the edge. Finally on his left buttock. The skin is somewhat better in the periwound. Surface wounds are superficial somewhat better than last week. 01/26/2022: Is a little bit of a mystery as to why his wounds fail to respond to treatments and actually seem to get worse. This is my first encounter with this patient. He was previously followed by Dr. Leanord Hawking. Based upon my review of the chart, it seems that there is a little bit of a mystery as to why his wounds do not respond as anticipated  to the interventions applied and sometimes even get worse. Biopsies have been performed and he was seen  by dermatology in [place], but that did not shed any light on the matter. T oday, his gluteal wound is larger, with substantial drainage, rather malodorous. The food wounds are not terrible, but he has a lot of callus and scaly skin around these. He is currently getting silver alginate on the gluteal wound, with idodoflex to the feet. He is using lotrisone on his legs for the dry, scaly skin. 02/09/2022: There has really been no change to any of his wounds. The gluteal wound less drainage and odor, but remains about the same size, the periwound skin remains oddly scaly. His lower extremity wounds also appear roughly the same size. They continue to accumulate a small amount of slough. The periwound on his feet and ankle wounds has dry eschar and loose dead skin. We have been using silver alginate on the gluteal wound and Iodoflex on his feet and ankle wounds. T the periwound around his gluteal lesion and Lotrisone on his feet and legs. o 02/23/2022: The right plantar foot wound is closed. The gluteal site looks small but has continued to produce hypertrophic granulation tissue. The foot wounds all look about the same on the dorsal surface of the right foot; on the left, there is only a small open area at the site of where his left great toenail would have been. 03/16/2022: The right ankle wound is healed. The right dorsal foot wound is about the same. The left dorsal foot wound is quite a bit smaller and the ischial wound is nearly closed. 03/30/2022: The right ankle wound reopened. Both dorsal foot wounds are quite a bit smaller. Unfortunately, he appears to have sheared part of his ischial wound open further, perhaps during a transfer. 04/13/2022: The right ankle wound has hypertrophic granulation tissue present. The dorsal foot wounds continue to decrease in size. The ischial wound looks about the same today, no better, no worse. 04/27/2022: The right ankle wound has closed. Unfortunately, it looks like  some moisture got underneath the dry skin on both of his dorsal feet and these wounds have expanded in size. The ischial wound remains the same with perhaps a little bit more slough accumulation than at our previous visit. 05/11/2022: The right ankle wound remains closed. There is a left anterior tibial wound that is small has patchy openings with accumulated slough. The dorsum of his right foot appears to be nearly healed with just a small punctate opening. The plantar surface of his right foot has a new opening that looks like he may have picked some skin there. His sacral ulcer has hypertrophic granulation tissue but has some slough accumulation. The dorsum of his left foot has multiple open areas in a fairly ragged distribution. All of these have slough accumulated within them. 06/01/2022: The right ankle and left anterior tibial wound are both closed. Dorsum of his right foot and left foot both look substantially better with just tiny scattered openings The without any slough accumulation. He has sheared open new areas on his left gluteus and ischium. He says that his wheelchair cushion, which is air-filled, has a leak and so it keeps deflating. He is awaiting a new cushion. 06/15/2022: The right ankle wound has reopened and the fat layer is exposed. Both dorsal feet have just small openings with just a little bit of slough and eschar accumulation. The wound on his left gluteus and ischium is larger again today  and has a foul odor. 06/29/2022: The right ankle wound has hypertrophic granulation tissue buildup. His dorsal foot wounds both look better with just some eschar on the surface. He has a new wound on his left lateral ankle. He is not sure how he acquired it but by appearance, it looks that he hit it on something, potentially his wheelchair or bed. The ischial wound is about the same but is cleaner without any significant purulent drainage or odor. He did not understand what the [facility] call  was about and therefore he does not have the topical compounded antibiotic. 07/13/2022: The right ankle wound again has hypertrophic granulation tissue, but less so than at his previous visit. The ischial wound has improved tremendously the use of the [facility] topical antibiotic. No significant change to the left lateral ankle wound; it is fibrotic with slough present. The skin of both Ende, Clenton E ([id number]) [id number] Page 17 of 40 of his feet, especially on the right, has a yeasty appearance. 08/10/2022: There is again hypertrophic granulation tissue on the right anterior ankle wound. Both feet are about the same. The left lateral ankle wound is a little bit desiccated and has some slough buildup. He unfortunately suffered a new injury when he was removing his pants and they caught his bandage which caused a large skin tear on his left ischium, just distal to the existing wound. The existing left ischial wound, however, is significantly better with just a little light slough on the surface. 08/31/2022: The right anterior ankle wound is a lot smaller today underneath some eschar. No accumulation of hypertrophic granulation tissue. The left lateral ankle wound has some slough on the surface but is better in terms of moisture balance this week. The left dorsal foot does not really have any openings on it today. The right dorsal foot has some slough and eschar accumulation. His gluteal ulcer is basically closed aside from 2 small areas that are oozing a bit. 09/21/2022: The right anterior ankle wound is down to just a tiny pinhole. The left lateral ankle wound has accumulated slough again, but moisture balance is good. Left and right dorsal feet both have 2 small openings with some slough in them. The gluteal ulcer, unfortunately has opened up substantially. 10/05/2022: The right anterior ankle wound has reopened and has a fair amount of slough on the surface. The left  lateral ankle wound has accumulated more slough, as well. He was approved for Apligraf, but the wound is not clean enough yet. There is some slough buildup on the dorsal right foot wound, as well as on his ischium. 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 10/26/2022: The right anterior ankle wound has closed again. The left lateral ankle wound looks quite a bit better. It is ready for Apligraf but we do not have 1 here for him. His ischium continues to get worse, with the wound larger and deeper. It really seems like this is a combination of pressure and friction. He has 2 new wounds, 1 on the plantar aspect of each foot. He says that he had some dry skin there that peeled away. Both are superficial. The dorsum of his left foot does not have any new wound opening. The dorsum of his right foot has a bit of slough accumulation. 11/24/2022: The right anterior ankle wound has 2 small open areas, both covered with eschar. The left lateral ankle wound has closed and considerably  with just a little slough on the surface. The ischium has improved most significantly, having closed in most of the open area with just a few small remaining superficial openings. The dorsal foot wounds are small and superficial. The bottoms of his feet are closed. 12/07/2022: The right anterior ankle wound remains open with 2 small areas, again with slough and eschar present. The left lateral ankle wound is down to about half a centimeter with just some slough on the surface. His right dorsal foot has opened up more and has both slough and eschar present. The ischium has continued to improve and now just has 2 small open areas that are very clean. 12/21/2022: He has a new wound on his right lateral ankle. There is some slough present. The left lateral ankle is quite a bit smaller with some slough buildup. His left ischial ulcer is also nearly closed; there is just a tear in  his skin that seems likely to be secondary to a transfer mishap. He has a new ulcer in his natal cleft that looks like it may be secondary to moisture. There is some slough in this area as well. The right anterior ankle is nearly closed. The right dorsal foot wound has some slough accumulation 01/04/2023: The anterior right ankle wound is closed, as is his ischial ulcer. The left lateral ankle wound is nearly closed with just a little bit of slough accumulation. The ulcer in his natal cleft is about the same size with slough buildup there, as well. He has a new wound on his left lateral leg near the knee from rubbing on his wheelchair. There is slough present, but no signs of infection. The right dorsal foot wound has a fairly heavy layer of slough buildup, as well. 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 He has been using his old wheelchair cushion for reasons that are unclear. The ulcer in his natal cleft has healed. The wound on his left lateral leg near the knee is smaller with some slough present. The right dorsal foot wound has accumulated slough again. The right lateral ankle wound is healed. The left lateral ankle wound is smaller again this week. 02/01/2023: His ischial ulcer has had a ton of drainage over the last 2 weeks and the drainage has an absolutely putrid odor. He has gone back to using his new wheelchair chair cushion, though. The wound on his left lateral leg near his knee has healed. The left lateral ankle wound is nearly healed. Both the dorsal foot wounds are larger and have a lot of slough accumulation and drainage. The wound on his right anterior ankle is healed but there is a new 1 just proximal to this, also with slough and eschar present. 02/15/2023:The left lateral and right lateral ankle wounds are both smaller. The right anterior ankle wound has some eschar on the surface. Both dorsal feet have a lot of slough accumulation. The ischial ulcer has epithelialized  in multiple areas, leaving several smaller areas that are quite friable and still with a pungent odor. His new Keystone prescription should be delivered tomorrow. We are using silver alginate on everything at this point. 03/08/2023: Both dorsal foot wounds are about the same size and have a lot of slough buildup. The ischial ulcer has improved quite a bit. The odor has abated and the surfaces are clean. It is still quite friable and 1 area is oozing. The left lateral and right lateral ankle wounds are nearly closed. The right anterior  ankle wound is healed. 03/22/2023: The right lateral ankle wound is healed. The left lateral ankle wound is tiny with just a little bit of slough accumulation. The dorsal foot wounds look about the same. The ischial ulcer is considerably smaller and there is no odor. There is slight slough accumulation. 04/14/2023: The right lateral ankle wound has reopened. It is fairly superficial but does have some hematoma in the tissue suggesting trauma or pressure as the etiology. The left lateral ankle wound is essentially a pinhole under some dry eschar. The dorsal foot wounds are stable. The ischial ulcer, unfortunately, has expanded to 5 separate openings in his skin. The wounds are clean but friable. 04/28/2023: The left lateral ankle wound has expanded and is a little bit deeper today. Dorsal foot wounds are stable. The ischial ulcer is less friable and does not have the strong odor that has been present. The right lateral ankle wound is a little bit smaller with some slough and eschar accumulation. 05/10/2023: The total area of the ischial wound is smaller. The left lateral ankle wound is nearly closed. Both dorsal foot wounds are quite a bit smaller. The right ankle wound has healed. 05/31/2023: The left lateral leg wound is down to just a superficial small area that almost looks like a scrape rather than the deeper ulcer it has been. His ischial ulcer is down to just 1 open spot.  It is clean but quite friable. Both dorsal foot ulcers are considerably smaller and have much less slough than usual. 06/21/2023: His ischial ulcer is still just 1 open area, but it is a little larger today. The surrounding skin, however, is in better condition Unfortunately, the left lateral leg wound is a little bit larger today. It looks as though some dry skin was peeled away. Both dorsal foot ulcers continue to contract; they are the smallest I have ever seen them and have only minimal slough on the surface. 07/12/2023: All of his wounds have deteriorated and his right anterior ankle ulcer is open again. He has had a lot more drainage, although he states it is clear and does not have any significant odor. This is resulted in breakdown around the left lateral ankle ulcer, both dorsal feet and has extended to the plantar surface of both feet. His ischial ulcer is also larger; this appears to be more due to shear than anything else. 08/02/2023: All of his wounds look better. There does not appear to be any rhyme or reason as to why they wax and wane as they do. 08/23/2023: The right dorsal ankle wound is covered with a extremely thin layer of skin but is still purple underneath. The left lateral ankle wound has quite a bit of slough on it and it appears that the tendon is exposed again. His ischial ulcer is down to 4 open areas, all of which are smaller than previously, but the surfaces remain friable and have some slough on them today. The dorsal foot wounds are both considerably smaller and the right plantar ulcer is closed. The left plantar ulcer is nearly closed. 09/26/2023: The right dorsal ankle wound is basically healed. The left lateral ankle wound is smaller. His ischial ulcers have merged and are a bit larger today. Both dorsal foot wounds are stable. The left plantar ulcer is closed but the right plantar ulcer has reopened. 12/3; the patient comes in today for what I gather is monthly  follow-up. Unfortunately things are are not going very well. He has multiple wounds  including on Leanos, Abdul E ([id number]) [id number] Page 18 of 40 his right dorsal foot left dorsal foot left lateral lower extremity left posterior lower extremity left buttock and right anterior lower extremity. The buttock wound which I gather is a pressure ulcer in this paraplegic patient is quite a bit worse at the top and area of necrotic granulation tissue. He has been using mupirocin silver alginate to all the wounds and his own compression stockings in his legs. I am not sure how much he keeps his legs elevated but he has been coached on this before 11/07/2023: Apparently the buttock area looks better today than it did at his previous visit. There is no necrosis present. It is still larger than when I saw it in November. The left lateral ankle wound is larger and appears deeper under a thick layer of slough. Both feet have deteriorated and the wounds are larger with slough build up. The right anterior ankle wound is open again. Electronic Signature(s) Signed: 11/07/2023 12:43:55 PM By: Duanne Guess MD FACS Entered By: Duanne Guess on 11/07/2023 11:54:40 -------------------------------------------------------------------------------- Physical Exam Details Patient Name: Date of Service: Wauters, [name] 11/07/2023 10:00 A M Medical Record Number: [id number] Patient Account Number: [id number] Date of Birth/Sex: Treating RN: 06/13/1986 (37 y.o. M) Primary Care Provider: [name], [name] Other Clinician: Referring Provider: Treating Provider/Extender: Benjamine Mola, [name]: 409 Constitutional .Tachycardic, asymptomatic. . . no acute distress. Respiratory Normal work of breathing on room air.. Notes 11/07/2023: Apparently the buttock area looks better today than it did at his previous visit. There is no necrosis present. It is still larger than  when I saw it in November. The left lateral ankle wound is larger and appears deeper under a thick layer of slough. Both feet have deteriorated and the wounds are larger with slough build up. The right anterior ankle wound is open again. Electronic Signature(s) Signed: 11/07/2023 12:43:55 PM By: Duanne Guess MD FACS Entered By: Duanne Guess on 11/07/2023 11:55:35 -------------------------------------------------------------------------------- Physician Orders Details Patient Name: Date of Service: Roye, [name] 11/07/2023 10:00 A M Medical Record Number: [id number] Patient Account Number: [id number] Date of Birth/Sex: Treating RN: 04/11/1986 (37 y.o. 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 - Dr. Lady Gary ***allow extra time*** Return A Anesthetic Wound #52 Right,Dorsal Foot (In clinic) Topical Lidocaine 4% applied to wound bed Bathing/ Shower/ Hygiene May shower and wash wound with soap and water. - on days that dressing is changed Off-Loading Roho cushion for  wheelchair - use newer cushion Turn and reposition every 2 hours - lift up with arms in wheelchair every hour during the day Wound Treatment Wound #52 - Foot Wound Laterality: Dorsal, Right Cleanser: Soap and Water Every Other Day/30  Days Discharge Instructions: May shower and wash wound with dial antibacterial soap and water prior to dressing change. Cleanser: Wound Cleanser Every Other Day/30 Days Discharge Instructions: Cleanse the wound with wound cleanser prior to applying a clean dressing using gauze sponges, not tissue or cotton balls. Peri-Wound Care: Sween Lotion (Moisturizing lotion) Every Other Day/30 Days Discharge Instructions: Apply Aquaphor moisturizing lotion as directed Topical: Mupirocin Ointment Every Other Day/30 Days Discharge Instructions: Apply Mupirocin (Bactroban) as instructed Prim Dressing: Maxorb Extra Ag+ Alginate Dressing, 2x2 (in/in) Every Other Day/30 Days ary Discharge Instructions: Apply to wound bed as instructed Secondary Dressing: Woven Gauze Sponge, Non-Sterile 4x4 in Every Other Day/30 Days Discharge Instructions: Apply over primary dressing as directed. Secured With: [employer], 4.5x3.1 (in/yd) (Generic) Every Other Day/30 Days Discharge Instructions: Secure with Kerlix as directed. Secured With: 34M Medipore H Soft Cloth Surgical T ape, 4 x 10 (in/yd) (Generic) Every Other Day/30 Days Discharge Instructions: Secure with tape as directed. Wound #56 - Foot Wound Laterality: Dorsal, Left Cleanser: Soap and Water Every Other Day/30 Days Discharge Instructions: May shower and wash wound with dial antibacterial soap and water prior to dressing change. Cleanser: Wound Cleanser Every Other Day/30 Days Discharge Instructions: Cleanse the wound with wound cleanser prior to applying a clean dressing using gauze sponges, not tissue or cotton balls. Peri-Wound Care: Sween Lotion (Moisturizing lotion) Every Other Day/30 Days Discharge Instructions: Apply  Aquaphor moisturizing lotion as directed Topical: Mupirocin Ointment Every Other Day/30 Days Discharge Instructions: Apply Mupirocin (Bactroban) as instructed Prim Dressing: Maxorb Extra Ag+ Alginate Dressing, 2x2 (in/in) Every Other Day/30 Days ary Discharge Instructions: Apply to wound bed as instructed Secondary Dressing: Woven Gauze Sponge, Non-Sterile 4x4 in Every Other Day/30 Days Discharge Instructions: Apply over primary dressing as directed. Secured With: [employer], 4.5x3.1 (in/yd) (Generic) Every Other Day/30 Days Discharge Instructions: Secure with Kerlix as directed. Secured With: 34M Medipore H Soft Cloth Surgical T ape, 4 x 10 (in/yd) (Generic) Every Other Day/30 Days Discharge Instructions: Secure with tape as directed. Wound #67 - Lower Leg Wound Laterality: Left, Lateral Cleanser: Soap and Water Every Other Day/30 Days Discharge Instructions: May shower and wash wound with dial antibacterial soap and water prior to dressing change. Cleanser: Wound Cleanser Every Other Day/30 Days Discharge Instructions: Cleanse the wound with wound cleanser prior to applying a clean dressing using gauze sponges, not tissue or cotton balls. Shrode, Raju E ([id number]) [id number] Page 20 of 40 Peri-Wound Care: Sween Lotion (Moisturizing lotion) Every Other Day/30 Days Discharge Instructions: Apply Aquaphor moisturizing lotion as directed Topical: Mupirocin Ointment Every Other Day/30 Days Discharge Instructions: Apply Mupirocin (Bactroban) as instructed Prim Dressing: Maxorb Extra Ag+ Alginate Dressing, 2x2 (in/in) Every Other Day/30 Days ary Discharge Instructions: Apply to wound bed as instructed Secondary Dressing: Woven Gauze Sponge, Non-Sterile 4x4 in Every Other Day/30 Days Discharge Instructions: Apply over primary dressing as directed. Secured With: [employer], 4.5x3.1 (in/yd) (Generic) Every Other Day/30 Days Discharge Instructions:  Secure with Kerlix as directed. Secured With: 34M Medipore H Soft Cloth Surgical T ape, 4 x 10 (in/yd) (Generic) Every Other Day/30 Days Discharge Instructions: Secure with tape as directed. Wound #74 - Upper Leg Wound Laterality: Left, Posterior Cleanser: Vashe 5.8 (oz) 1 x Per Day/30 Days Discharge Instructions: Cleanse the wound with Vashe prior to applying a clean dressing using gauze sponges , let sit on wound for 10 minutes Peri-Wound Care: Zinc Oxide Ointment 30g tube 1 x Per Day/30 Days Discharge Instructions: Apply Zinc Oxide to  periwound with each dressing change as needed fpr moisture Topical: Mupirocin Ointment 1 x Per Day/30 Days Discharge Instructions: Apply Mupirocin (Bactroban) as instructed Topical: [facility]stone antibiotic compound 1 x Per Day/30 Days Prim Dressing: Maxorb Extra Ag+ Alginate Dressing, 4x4.75 (in/in) 1 x Per Day/30 Days ary Discharge Instructions: Apply to wound bed as instructed Secondary Dressing: Zetuvit Plus Silicone Border Sacrum Dressing, Sm, 7x7 (in/in) 1 x Per Day/30 Days Discharge Instructions: Apply silicone border over primary dressing as directed. Secured With: 21M Medipore H Soft Cloth Surgical T ape, 4 x 10 (in/yd) 1 x Per Day/30 Days Discharge Instructions: Secure with tape as directed. Wound #78 - Ankle Wound Laterality: Right, Anterior Cleanser: Soap and Water Every Other Day/30 Days Discharge Instructions: May shower and wash wound with dial antibacterial soap and water prior to dressing change. Cleanser: Wound Cleanser Every Other Day/30 Days Discharge Instructions: Cleanse the wound with wound cleanser prior to applying a clean dressing using gauze sponges, not tissue or cotton balls. Peri-Wound Care: Sween Lotion (Moisturizing lotion) Every Other Day/30 Days Discharge Instructions: Apply Aquaphor moisturizing lotion as directed Topical: Mupirocin Ointment Every Other Day/30 Days Discharge Instructions: Apply Mupirocin (Bactroban) as  instructed Prim Dressing: Maxorb Extra Ag+ Alginate Dressing, 2x2 (in/in) Every Other Day/30 Days ary Discharge Instructions: Apply to wound bed as instructed Secondary Dressing: Woven Gauze Sponge, Non-Sterile 4x4 in Every Other Day/30 Days Discharge Instructions: Apply over primary dressing as directed. Secured With: [employer], 4.5x3.1 (in/yd) (Generic) Every Other Day/30 Days Discharge Instructions: Secure with Kerlix as directed. Secured With: 21M Medipore H Soft Cloth Surgical T ape, 4 x 10 (in/yd) (Generic) Every Other Day/30 Days Discharge Instructions: Secure with tape as directed. Wound #81 - Foot Wound Laterality: Plantar, Right Cleanser: Soap and Water Every Other Day/30 Days Discharge Instructions: May shower and wash wound with dial antibacterial soap and water prior to dressing change. Cleanser: Wound Cleanser Every Other Day/30 Days Discharge Instructions: Cleanse the wound with wound cleanser prior to applying a clean dressing using gauze sponges, not tissue or cotton balls. Peri-Wound Care: Sween Lotion (Moisturizing lotion) Every Other Day/30 Days Discharge Instructions: Apply Aquaphor moisturizing lotion as directed Topical: Mupirocin Ointment Every Other Day/30 Days Discharge Instructions: Apply Mupirocin (Bactroban) as instructed Johannsen, Tito E ([id number]) [id number] Page 21 of 40 Prim Dressing: Maxorb Extra Ag+ Alginate Dressing, 2x2 (in/in) Every Other Day/30 Days ary Discharge Instructions: Apply to wound bed as instructed Secondary Dressing: Woven Gauze Sponge, Non-Sterile 4x4 in Every Other Day/30 Days Discharge Instructions: Apply over primary dressing as directed. Secured With: [employer], 4.5x3.1 (in/yd) (Generic) Every Other Day/30 Days Discharge Instructions: Secure with Kerlix as directed. Secured With: 21M Medipore H Soft Cloth Surgical T ape, 4 x 10 (in/yd) (Generic) Every Other Day/30 Days Discharge  Instructions: Secure with tape as directed. Wound #82 - Lower Leg Wound Laterality: Left, Lateral, Proximal Cleanser: Soap and Water Every Other Day/30 Days Discharge Instructions: May shower and wash wound with dial antibacterial soap and water prior to dressing change. Cleanser: Wound Cleanser Every Other Day/30 Days Discharge Instructions: Cleanse the wound with wound cleanser prior to applying a clean dressing using gauze sponges, not tissue or cotton balls. Peri-Wound Care: Sween Lotion (Moisturizing lotion) Every Other Day/30 Days Discharge Instructions: Apply Aquaphor moisturizing lotion as directed Topical: Mupirocin Ointment Every Other Day/30 Days Discharge Instructions: Apply Mupirocin (Bactroban) as instructed Prim Dressing: Maxorb Extra Ag+ Alginate Dressing, 2x2 (in/in) Every Other Day/30 Days ary Discharge Instructions: Apply to wound bed as instructed Secondary  Dressing: Woven Gauze Sponge, Non-Sterile 4x4 in Every Other Day/30 Days Discharge Instructions: Apply over primary dressing as directed. Secured With: [employer], 4.5x3.1 (in/yd) (Generic) Every Other Day/30 Days Discharge Instructions: Secure with Kerlix as directed. Secured With: 72M Medipore H Soft Cloth Surgical T ape, 4 x 10 (in/yd) (Generic) Every Other Day/30 Days Discharge Instructions: Secure with tape as directed. Electronic Signature(s) Signed: 11/07/2023 12:43:55 PM By: Duanne Guess MD FACS Entered By: Duanne Guess on 11/07/2023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 Surface still requiring debridement using Hydrofera Blue 05/19/17; continued gradual improvement in the refractory left lateral venous ulceration. Careful inspection of the wound bed underlying rumination suggested some degree of epithelialization over the surface no debridement indicated. Continue Hydrofera Blue difficult areas between his toes first and third on the left than first and second on the right. I'm going to change to silver alginate from silver collagen. Continue ketoconazole as I suspect underlying tinea pedis 05/26/17; left lateral leg venous insufficiency wound. We've been using Hydrofera Blue. I believe that there is expanding epithelialization over the surface of the wound albeit not coming from the wound circumference. This is a bit of an odd situation in which the epithelialization seems to be coming from  the surface of the wound rather than in the exact circumference. There is still small open areas mostly along the lateral margin of the wound. He has unchanged areas between the left first and second and the right first second toes which I been treating for tenia pedis 06/02/17; left lateral leg venous insufficiency wound. We have been using Hydrofera Blue. Somewhat smaller from the wound circumference. The surface of the wound remains a bit on it almost epithelialized sedation in appearance. I use an open curette today debridement in the surface of all of this especially the edges Small open wounds remaining on the dorsal right first and second toe interspace and the plantar left first second toe and her face on the left 06/09/17; wound on the left lateral leg continues to be smaller but very  gradual and very dry surface using Hydrofera Blue 06/16/17 requires weekly debridements now on the left lateral leg although this continues to contract. I changed to silver collagen last week because of dryness of the wound bed. Using Iodoflex to the areas on his first and second toes/web space bilaterally 06/24/17; patient with history of paraplegia also chronic venous insufficiency with lymphedema. Has a very difficult wound on the left lateral leg. This has been gradually reducing in terms of with but comes in with a very dry adherent surface. High switch to silver collagen a week or so ago with hydrogel to keep the area moist. This is been refractory to multiple dressing attempts. He also has areas in his first and second toes bilaterally in the anterior and posterior web space. I had been using Iodoflex here after a prolonged course of silver alginate with ketoconazole was ineffective [question tinea pedis] 07/14/17; patient arrives today with a very difficult adherent material over his left lateral lower leg wound. He also has surrounding erythema and poorly controlled edema. He was switched his Santyl last visit which the nurses are applying once during his doctor visit and once on a nurse visit. He was also reduced to 2 layer compression I'm not exactly sure of the issue here. 07/21/17; better surface today after 1 week of Iodoflex. Significant cellulitis that we treated last week also better. [Doxycycline] 07/28/17 better surface today with now 2 weeks of Iodoflex. Significant cellulitis treated with doxycycline. He has now completed the doxycycline and he is back to his usual degree of chronic venous inflammation/stasis dermatitis. He reminds me he has had ablations surgery here 08/04/17; continued improvement with Iodoflex to the left lateral leg wound in terms of the surface of the wound although the dimensions are better. He is not currently on any antibiotics, he has the usual degree of chronic  venous inflammation/stasis dermatitis. Problematic areas on the plantar aspect of the first second toe web space on the left and the dorsal aspect of the first second toe web space on the right. At one point I felt these were probably related to chronic fungal infections in treated him aggressively for this although we have not made any improvement here. 08/11/17; left lateral leg. Surface continues to improve with the Iodoflex although we are not seeing much improvement in overall wound dimensions. Areas on his plantar left foot and right foot show no improvement. In fact the right foot looks somewhat worse 08/18/17; left lateral leg. We changed to [facility] Blue last week after a prolonged course of Iodoflex which helps get the surface better. It appears that the wound with is improved. Continue with difficult areas on the left dorsal first  second and plantar first second on the right 09/01/17; patient arrives in clinic today having had a temperature of 103 yesterday. He was seen in the ER and [facility]. The patient was concerned he could have cellulitis again in the right leg however they diagnosed him with a UTI and he is now on Keflex. He has a history of cellulitis which is been recurrent and difficult but this is been in the left leg, in the past 5 use doxycycline. He does in and out catheterizations at home which are risk factors for UTI 09/08/17; patient will be completing his Keflex this weekend. The erythema on the left leg is considerably better. He has a new wound today on the medial part of the right leg small superficial almost looks like a skin tear. He has worsening of the area on the right dorsal first and second toe. His major area on the left lateral leg is better. Using Hydrofera Blue on all areas 09/15/17; gradual reduction in width on the long wound in the left lateral leg. No debridement required. He also has wounds on the plantar aspect of his left first Joffe, Dencil E ([id number])  [id number] Page 26 of 40 second toe web space and on the dorsal aspect of the right first second toe web space. 09/22/17; there continues to be very gradual improvements in the dimensions of the left lateral leg wound. He hasn't round erythematous spot with might be pressure on his wheelchair. There is no evidence obviously of infection no purulence no warmth He has a dry scaled area on the plantar aspect of the left first second toe Improved area on the dorsal right first second toe. 09/29/17; left lateral leg wound continues to improve in dimensions mostly with an is still a fairly long but increasingly narrow wound. He has a dry scaled area on the plantar aspect of his left first second toe web space Increasingly concerning area on the dorsal right first second toe. In fact I am concerned today about possible cellulitis around this wound. The areas extending up his second toe and although there is deformities here almost appears to abut on the nailbed. 10/06/17; left lateral leg wound continues to make very gradual progress. Tissue culture I did from the right first second toe dorsal foot last time grew MRSA and enterococcus which was vancomycin sensitive. This was not sensitive to clindamycin or doxycycline. He is allergic to Zyvox and sulfa we have therefore arrange for him to have dalvance infusion tomorrow. He is had this in the past and tolerated it well 10/20/17; left lateral leg wound continues to make decent progress. This is certainly reduced in terms of with there is advancing epithelialization.The cellulitis in the right foot looks better although he still has a deep wound in the dorsal aspect of the first second toe web space. Plantar left first toe web space on the left I think is making some progress 10/27/17; left lateral leg wound continues to make decent progress. Advancing epithelialization.using Hydrofera Blue The right first second toe web space wound  is better-looking using silver alginate Improvement in the left plantar first second toe web space. Again using silver alginate 11/03/17 left lateral leg wound continues to make decent progress albeit slowly. Using [facility] The right per second toe web space continues to be a very problematic looking punched out wound. I obtained a piece of tissue for deep culture I did extensively treated this for fungus. It is difficult to imagine that this is a pressure  area as the patient states other than going outside he doesn't really wear shoes at home The left plantar first second toe web space looked fairly senescent. Necrotic edges. This required debridement change to [facility] Blue to all wound areas 11/10/17; left lateral leg wound continues to contract. Using Hydrofera Blue On the right dorsal first second toe web space dorsally. Culture I did of this area last week grew MRSA there is not an easy oral option in this patient was multiple antibiotic allergies or intolerances. This was only a rare culture isolate I'm therefore going to use Bactroban under silver alginate On the left plantar first second toe web space. Debridement is required here. This is also unchanged 11/17/17; left lateral leg wound continues to contract using Hydrofera Blue this is no longer the major issue. The major concern here is the right first second toe web space. He now has an open area going from dorsally to the plantar aspect. There is now wound on the inner lateral part of the first toe. Not a very viable surface on this. There is erythema spreading medially into the forefoot. No major change in the left first second toe plantar wound 11/24/17; left lateral leg wound continues to contract using Hydrofera Blue. Nice improvement today The right first second toe web space all of this looks a lot less angry than last week. I have given him clindamycin and topical Bactroban for MRSA and terbinafine for the possibility of  underlining tinea pedis that I could not control with ketoconazole. Looks somewhat better The area on the plantar left first second toe web space is weeping with dried debris around the wound 12/01/17; left lateral leg wound continues to contract he Hydrofera Blue. It is becoming thinner in terms of with nevertheless it is making good improvement. The right first second toe web space looks less angry but still a large necrotic-looking wounds starting on the plantar aspect of the right foot extending between the toes and now extensively on the base of the right second toe. I gave him clindamycin and topical Bactroban for MRSA anterior benefiting for the possibility of underlying tinea pedis. Not looking better today The area on the left first/second toe looks better. Debrided of necrotic debris 12/05/17* the patient was worked in urgently today because over the weekend he found blood on his incontinence bad when he woke up. He was found to have an ulcer by his wife who does most of his wound care. He came in today for [place] to look at this. He has not had a history of wounds in his buttocks in spite of his paraplegia. 12/08/17; seen in follow-up today at his usual appointment. He was seen earlier this week and found to have a new wound on his buttock. We also follow him for wounds on the left lateral leg, left first second toe web space and right first second toe web space 12/15/17; we have been using Hydrofera Blue to the left lateral leg which has improved. The right first second toe web space has also improved. Left first second toe web space plantar aspect looks stable. The left buttock has worsened using Santyl. Apparently the buttock has drainage 12/22/17; we have been using Hydrofera Blue to the left lateral leg which continues to improve now 2 small wounds separated by normal skin. He tells [place] he had a fever up to 100 yesterday he is prone to UTIs but has not noted anything different. He does in and  out catheterizations. The area  between the first and second toes today does not look good necrotic surface covered with what looks to be purulent drainage and erythema extending into the third toe. I had gotten this to something that I thought look better last time however it is not look good today. He also has a necrotic surface over the buttock wound which is expanded. I thought there might be infection under here so I removed a lot of the surface with a #5 curet though nothing look like it really needed culturing. He is been using Santyl to this area 12/27/17; his original wound on the left lateral leg continues to improve using Hydrofera Blue. I gave him samples of Baxdella although he was unable to take them out of fear for an allergic reaction ["lump in his throat"].the culture I did of the purulent drainage from his second toe last week showed both enterococcus and a set Enterobacter I was also concerned about the erythema on the bottom of his foot although paradoxically although this looks somewhat better today. 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 We've been using silver alginate here. They came up today that he sleeps in a recliner, I'm not sure why but I asked him to stop this 01/03/18; his original wound we've been using Hydrofera Blue is now separated into 2 areas. Ulcer on his left buttock is better he is off the recliner and sleeping in bed Finally both wound areas between his first and second toes also looks some better 01/10/18; his original wound on the left lateral leg is now separated into 2 wounds we've been using Hydrofera Blue Ulcer on his left buttock has some drainage. There is a small probing site going into muscle layer superiorly.using silver alginate -He arrives today with a deep tissue injury on the left heel The wound on the dorsal aspect of his first second toe on the left  looks a lot betterusing silver alginate ketoconazole The area on the first second toe web space on the right also looks a lot bette 01/17/18; his original wound on the left lateral leg continues to progress using Hydrofera Blue Ulcer on his left buttock also is smaller surface healthier except for a small probing site going into the muscle layer superiorly. 2.4 cm of tunneling in this area DTI on his left heel we have only been offloading. Looks better than last week no threatened open no evidence of infection the wound on the dorsal aspect of the first second toe on the left continues to look like it's regressing we have only been using silver alginate and terbinafine orally The area in the first second toe web space on the right also looks to be a lot better using silver alginate and terbinafine I think this was prompted by tinea pedis 01/31/18; the patient was hospitalized in [place] last week apparently for a complicated UTI. He was discharged on cefepime he does in and out catheterizations. In the hospital he was discovered M I don't mild elevation of AST and ALT and the terbinafine was stopped.predictably the pressure ulcer on s his buttock looks betterusing silver alginate. The area on the left lateral leg also is better using Hydrofera Blue. The area between the first and second toes on the left better. First and second toes on the right still substantial but better. Finally the DTI on the left heel has held together and looks like it's resolving 02/07/18-he is  here in follow-up evaluation for multiple ulcerations. He has new injury to the lateral aspect of the last issue a pressure ulcer, he states this is from adhesive removal trauma. He states he has tried multiple adhesive products with no success. All other ulcers appear stable. The left heel DTI is resolving. We will continue with same treatment plan and follow-up next week. 02/14/18; follow-up for multiple areas. He has a new area last  week on the lateral aspect of his pressure ulcer more over the posterior trochanter. The original pressure ulcer looks quite stable has healthy granulation. 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 Almost fully epithelialized on the original superior area [name], Rondo E ([id number]) [id number] Page 27 of 40 using Hydrofera Blue DTI on the left heel has peeled off this week to reveal a small superficial wound under denuded skin and subcutaneous tissue Both areas between the first and second toes look better including nothing open on the left 02/21/18; The patient's wounds on his left ischial tuberosity and posterior left greater trochanter actually looked better. He has a large area of irritation around the area which I think is contact dermatitis. I am doubtful that this is fungal His original wound on the left lateral calf continues to improve we have been using Hydrofera Blue There is no open area in the left first second toe web space although there is a lot of thick callus The DTI on the left heel required debridement today of necrotic surface eschar and subcutaneous tissue using silver alginate Finally the area on the right first second toe webspace continues to contract using silver alginate and ketoconazole 02/28/18 Left ischial tuberosity wounds look better using silver alginate. Original wound on the left calf only has one small open area left using Hydrofera Blue DTI on the left heel required debridement mostly removing skin from around this wound surface. Using silver alginate The areas on the right first/second toe web space using silver alginate and ketoconazole 03/08/18 on evaluation today patient appears to be doing decently well as best I can tell in regard to his wounds. This is the first time that I have seen him as he generally is followed by Dr.  Leanord Hawking. With that being said none of his wounds appear to be infected he does have an area where there is some skin covering what appears to be a new wound on the left dorsal surface of his great toe. This is right at the nail bed. With that being said I do believe that debrided away some of the excess skin can be of benefit in this regard. Otherwise he has been tolerating the dressing changes without complication. 03/14/18; patient arrives today with the multiplicity of wounds that we are following. He has not been systemically unwell Original wound on the left lateral calf now only has 2 small open areas we've been using Hydrofera Blue which should continue The deep tissue injury on the left heel requires debridement today. We've been using silver alginate The left first second toe and the right first second toe are both are reminiscence what I think was tinea pedis. Apparently some of the callus Surface between the toes was removed last week when it started draining. Purulent drainage coming from the wound on the ischial tuberosity on the left. 03/21/18-He is here in follow-up evaluation for multiple wounds. There is improvement, he is currently taking doxycycline, culture obtained last week  grew tetracycline sensitive MRSA. He tolerated debridement. The only change to last week's recommendations is to discontinue antifungal cream between toes. He will follow-up next week 03/28/18; following up for multiple wounds;Concern this week is streaking redness and swelling in the right foot. He is going to need antibiotics for this. 03/31/18; follow-up for right foot cellulitis. Streaking redness and swelling in the right foot on 03/28/18. 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 I put him on clindamycin 300 mg every 6 and brought him in for a quick check. He has an open wound between his first and second toes on the right foot as a potential source. 04/04/18; Right foot cellulitis is  resolving he is completing clindamycin. This is truly good news Left lateral calf wound which is initial wound only has one small open area inferiorly this is close to healing out. He has compression stockings. We will use Hydrofera Blue right down to the epithelialization of this Nonviable surface on the left heel which was initially pressure with a DTI. We've been using Hydrofera Blue. I'm going to switch this back to silver alginate Left first second toe/tinea pedis this looks better using silver alginate Right first second toe tinea pedis using silver alginate Large pressure ulcers on theLeft ischial tuberosity. Small wound here Looks better. I am uncertain about the surface over the large wound. Using silver alginate 04/11/18; Cellulitis in the right foot is resolved Left lateral calf wound which was his original wounds still has 2 tiny open areas remaining this is just about closed Nonviable surface on the left heel is better but still requires debridement Left first second toe/tinea pedis still open using silver alginate Right first second toe wound tinea pedis I asked him to go back to using ketoconazole and silver alginate Large pressure ulcers on the left ischial tuberosity this shear injury here is resolved. Wound is smaller. No evidence of infection using silver alginate 04/18/18; Patient arrives with an intense area of cellulitis in the right mid lower calf extending into the right heel area. Bright red and warm. Smaller area on the left anterior leg. He has a significant history of MRSA. He will definitely need antibioticsdoxycycline He now has 2 open areas on the left ischial tuberosity the original large wound and now a satellite area which I think was above his initial satellite areas. Not a wonderful surface on this satellite area surrounding erythema which looks like pressure related. His left lateral calf wound again his original wound is just about closed Left heel pressure  injury still requiring debridement Left first second toe looks a lot better using silver alginate Right first second toe also using silver alginate and ketoconazole cream also looks better 04/20/18; the patient was worked in early today out of concerns with his cellulitis on the right leg. I had started him on doxycycline. This was 2 days ago. His wife was concerned about the swelling in the area. Also concerned about the left buttock. He has not been systemically unwell no fever chills. No nausea vomiting or diarrhea 04/25/18; the patient's left buttock wound is continued to deteriorate he is using Hydrofera Blue. He is still completing clindamycin for the cellulitis on the right leg although all of this looks better. 05/02/18 Left buttock wound still with a lot of drainage and a very tightly adherent fibrinous necrotic surface. He has a deeper area superiorly The left lateral calf wound is still closed DTI wound on the left heel necrotic surface especially the  circumference using Iodoflex Areas between his left first second toe and right first second toe both look better. Dorsally and the right first second toe he had a necrotic surface although at smaller. In using silver alginate and ketoconazole. I did a culture last week which was a deep tissue culture of the reminiscence of the open wound on the right first second toe dorsally. This grew a few Acinetobacter and a few methicillin-resistant staph aureus. Nevertheless the area actually this week looked better. I didn't feel the need to specifically address this at least in terms of systemic antibiotics. 05/09/18; wounds are measuring larger more drainage per our intake. We are using Santyl covered with alginate on the large superficial buttock wounds, Iodosorb on the left heel, ketoconazole and silver alginate to the dorsal first and second toes bilaterally. 05/16/18; The area on his left buttock better in some aspects although the area superiorly  over the ischial tuberosity required an extensive debridement.using Santyl Left heel appears stable. Using Iodoflex The areas between his first and second toes are not bad however there is spreading erythema up the dorsal aspect of his left foot this looks like cellulitis again. He is insensate the erythema is really very brilliant.o Erysipelas He went to see an allergist days ago because he was itching part of this he had lab work done. This showed a white count of 15.1 with 70% neutrophils. Hemoglobin of 11.4 and a platelet count of 659,000. Last white count we had in [facility] was a 2-1/2 years ago which was 25.9 but he was ill at the time. He was able to show me some lab work that was done by his primary physician the pattern is about the same. 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 But prompted me to go ahead and do x-rays of both feet and the pelvis rule out osteomyelitis. He also had a comprehensive metabolic panel this was reasonably normal his albumin was 3.7 liver function tests BUN/creatinine all normal 05/23/18; x-rays of both his feet from last week were negative for underlying pulmonary abnormality. The x-ray of his pelvis however showed mild irregularity in the left ischial which may represent some early osteomyelitis. The wound in the left ischial continues to get deeper clearly now exposed muscle. Each week necrotic surface material over this area. Whereas the rest of the wounds do not look so bad. The left ischial wound we have been using Santyl and calcium alginate T the left heel surface necrotic debris using Iodoflex o The left lateral leg is still healed Archbold, Daivion E ([id number]) [id number] Page 28 of 40 Areas on the left dorsal foot and the right dorsal foot are about the same. There is some inflammation on the left which might represent contact dermatitis, fungal dermatitis I am doubtful cellulitis  although this looks better than last week 05/30/18; CT scan done at Hospital did not show any osteomyelitis or abscess. Suggested the possibility of underlying cellulitis although I don't see a lot of evidence of this at the bedside The wound itself on the left buttock/upper thigh actually looks somewhat better. No debridement Left heel also looks better no debridement continue Iodoflex Both dorsal first second toe spaces appear better using Lotrisone. Left still required debridement 06/06/18; Intake reported some purulent looking drainage from the left gluteal wound. Using Santyl and calcium alginate Left heel looks better although still a nonviable surface requiring debridement The left dorsal foot first/second webspace actually expanding and  somewhat deeper. I may consider doing a shave biopsy of this area Right dorsal foot first/second webspace appears stable to improved. Using Lotrisone and silver alginate to both these areas 06/13/18 Left gluteal surface looks better. Now separated in the 2 wounds. No debridement required. Still drainage. We'll continue silver alginate Left heel continues to look better with Iodoflex continue this for at least another week Of his dorsal foot wounds the area on the left still has some depth although it looks better than last week. We've been using Lotrisone and silver alginate 06/20/18 Left gluteal continues to look better healthy tissue Left heel continues to look better healthy granulation wound is smaller. He is using Iodoflex and his long as this continues continue the Iodoflex Dorsal right foot looks better unfortunately dorsal left foot does not. There is swelling and erythema of his forefoot. He had minor trauma to this several days ago but doesn't think this was enough to have caused any tissue injury. Foot looks like cellulitis, we have had this problem before 06/27/18 on evaluation today patient appears to be doing a little worse in regard to his foot  ulcer. 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 Both of which would really be his only options for treating this infection. In the past he has been given and effusion of Orbactiv. This is done very well for him in the past again it's one time dosing IV antibiotic therapy. Subsequently I do believe this is something we're gonna need to see about doing at this point in time. Currently his other wounds seem to be doing somewhat better in my pinion I'm pretty happy in that regard. 07/03/18 on evaluation today patient's wounds actually appear to be doing fairly well. He has been tolerating the dressing changes without complication. All in all he seems to be showing signs of improvement. In regard to the antibiotics he has been dealing with infectious disease since I saw him last week as far as getting this scheduled. In the end [facility]'s going [facility] this coming Friday. In the meantime he has been continuing to perform the dressing changes in such as previous. There does not appear to be any evidence of infection worsengin at this time. 07/10/18; Since I last saw this man 2 weeks ago things have actually improved. IV antibiotics of resulted in less forefoot erythema although there is still some present. He is not systemically unwell Left buttock wounds 2 now have no depth there is increased epithelialization Using silver alginate Left heel still requires debridement using Iodoflex Left dorsal foot still with a sizable wound about the size of a border but healthy granulation Right dorsal foot still with a slitlike area using silver alginate 07/18/18; the patient's cellulitis in the left foot is improved in fact I think it is on its way to resolving. Left buttock wounds 2 both look better although the larger one has  hypertension granulation we've been using silver alginate Left heel has some thick circumferential redundant skin over the wound edge which will need to be removed today we've been using Iodoflex Left dorsal foot is still a sizable wound required debridement using silver alginate The right dorsal foot is just about closed only a small open area remains here 07/25/18; left foot cellulitis is resolved Left buttock wounds 2  both look better. Hyper-granulation on the major area Left heel as some debris over the surface but otherwise looks a healthier wound. Using silver collagen Right dorsal foot is just about closed 07/31/18; arrives with our intake nurse worried about purulent drainage from the buttock. We had hyper-granulation here last week His buttock wounds 2 continue to look better Left heel some debris over the surface but measuring smaller. Right dorsal foot unfortunately has openings between the toes Left foot superficial wound looks less aggravated. 08/07/18 Buttock wounds continue to look better although some of her granulation and the larger medial wound. silver alginate Left heel continues to look a lot better.silver collagen Left foot superficial wound looks less stable. Requires debridement. He has a new wound superficial area on the foot on the lateral dorsal foot. Right foot looks better using silver alginate without Lotrisone 08/14/2018; patient was in the ER last week diagnosed with a UTI. He is now on Cefpodoxime and Macrodantin. Buttock wounds continued to be smaller. Using silver alginate Left heel continues to look better using silver collagen Left foot superficial wound looks as though it is improving Right dorsal foot area is just about healed. 08/21/2018; patient is completed his antibiotics for his UTI. He has 2 open areas on the buttocks. There is still not closed although the surface looks satisfactory. Using silver alginate Left heel continues to improve using silver  collagen The bilateral dorsal foot areas which are at the base of his first and second toes/possible tinea pedis are actually stable on the left but worse on the right. The area on the left required debridement of necrotic surface. After debridement I obtained a specimen for PCR culture. The right dorsal foot which is been just about healed last week is now reopened 08/28/2018; culture done on the left dorsal foot showed coag negative staph both staph epidermidis and Lugdunensis. I think this is worthwhile initiating systemic treatment. I will use doxycycline given his long list of allergies. The area on the left heel slightly improved but still requiring debridement. The large wound on the buttock is just about closed whereas the smaller one is larger. Using silver alginate in this area 09/04/2018; patient is completing his doxycycline for the left foot although this continues to be a very difficult wound area with very adherent necrotic debris. We are using silver alginate to all his wounds right foot left foot and the small wounds on his buttock, silver collagen on the left heel. 09/11/2018; once again this patient has intense erythema and swelling of the left forefoot. Lesser degrees of erythema in the right foot. He has a long list of allergies and intolerances. I will reinstitute doxycycline. 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 Using silver alginate Left heel also looks better using silver collagen Unfortunately both the areas on his feet look worse. The area on the left first second webspace is now gone through to the plantar part of his foot. The area on the left foot anteriorly is irritated with erythema and swelling in the forefoot. 09/25/2018 His wound on the left plantar heel looks better. Using silver collagen The area on the left buttock 2 small remnant areas. One is closed one is still open. Using silver alginate The areas between  both his first and second toes look worse. This in spite of long-standing antifungal therapy with ketoconazole and silver alginate which should have antifungal activity He has small areas around his original  wound on the left calf one is on the bottom of the original scar tissue and one superiorly both of these are small and superficial but again given wound history in this site this is worrisome 10/02/2018 Left plantar heel continues to gradually contract using silver collagen Aultman, Vincenzo E ([id number]) [id number] Page 29 of 40 Left buttock wound is unchanged using silver alginate The areas on his dorsal feet between his first and second toes bilaterally look about the same. I prescribed clindamycin ointment to see if we can address chronic staph colonization and also the underlying possibility of erythrasma The left lateral lower extremity wound is actually on the lateral part of his ankle. Small open area here. We have been using silver alginate 10/09/2018; Left plantar heel continues to look healthy and contract. No debridement is required Left buttock slightly smaller with a tape injury wound just below which was new this week Dorsal feet somewhat improved I have been using clindamycin Left lateral looks lower extremity the actual open area looks worse although a lot of this is epithelialized. I am going to change to silver collagen today He has a lot more swelling in the right leg although this is not pitting not red and not particularly warm there is a lot of spasm in the right leg usually indicative of people with paralysis of some underlying discomfort. We have reviewed his vascular status from 2017 he had a left greater saphenous vein ablation. I wonder about referring him back to vascular surgery if the area on the left leg continues to deteriorate. 10/16/2018 in today for follow-up and management of multiple lower extremity ulcers. His left Buttock wound is  much lower smaller and almost closed completely. The wound to the left ankle has began to reopen with Epithelialization and some adherent slough. He has multiple new areas to the left foot and leg. The left dorsal foot without much improvement. Wound present between left great webspace and 2nd toe. Erythema and edema present right leg. Right LE ultrasound obtained on 10/10/18 was negative for DVT . 10/23/2018; Left buttock is closed over. Still dry macerated skin but there is no open wound. I suspect this is chronic pressure/moisture Left lateral calf is quite a bit worse than when I saw this last. There is clearly drainage here he has macerated skin into the left plantar heel. We will change the primary dressing to alginate Left dorsal foot has some improvement in overall wound area. Still using clindamycin and silver alginate Right dorsal foot about the same as the left using clindamycin and silver alginate The erythema in the right leg has resolved. He is DVT rule out was negative Left heel pressure area required debridement although the wound is smaller and the surface is health 10/26/2018 The patient came back in for his nurse check today predominantly because of the drainage coming out of the left lateral leg with a recent reopening of his original wound on the left lateral calf. He comes in today with a large amount of surrounding erythema around the wound extending from the calf into the ankle and even in the area on the dorsal foot. He is not systemically unwell. He is not febrile. Nevertheless this looks like cellulitis. We have been using silver alginate to the area. I changed him to a regular visit and I am going to prescribe him doxycycline. The rationale here is a long list of medication intolerances and a history of MRSA. I did not see anything that I thought would  provide a valuable culture 10/30/2018 Follow-up from his appointment 4 days ago with really an extensive area of cellulitis  in the left calf left lateral ankle and left dorsal foot. I put him on doxycycline. He has a long list of medication allergies which are true allergy reactions. 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 In any case he is a lot better today. The erythema especially in the anterior and lateral left calf is better. He still has left ankle erythema. He also is complaining about increasing edema in the right leg we have only been using Kerlix Coban and he has been doing the wraps at home. Finally he has a spotty rash on the medial part of his upper left calf which looks like folliculitis or perhaps wrap occlusion type injury. Small superficial macules not pustules 11/06/18 patient arrives today with again a considerable degree of erythema around the wound on the left lateral calf extending into the dorsal ankle and dorsal foot. This is a lot worse than when I saw this last week. He is on doxycycline really with not a lot of improvement. He has not been systemically unwell Wounds on the; left heel actually looks improved. Original area on the left foot and proximity to the first and second toes looks about the same. He has superficial areas on the dorsal foot, anterior calf and then the reopening of his original wound on the left lateral calf which looks about the same The only area he has on the right is the dorsal webspace first and second which is smaller. He has a large area of dry erythematous skin on the left buttock small open area here. 11/13/2018; the patient arrives in much better condition. The erythema around the wound on the left lateral calf is a lot better. Not sure whether this was the clindamycin or the TCA and ketoconazole or just in the improvement in edema control [stasis dermatitis]. In any case this is a lot better. The area on the left heel is very small and just about resolved using silver collagen we have been  using silver alginate to the areas on his dorsal feet 11/20/2018; his wounds include the left lateral calf, left heel, dorsal aspects of both feet just proximal to the first second webspace. He is stable to slightly improved. I did not think any changes to his dressings were going to be necessary 11/27/2018 he has a reopening on the left buttock which is surrounded by what looks like tinea or perhaps some other form of dermatitis. The area on the left dorsal foot has some erythema around it I have marked this area but I am not sure whether this is cellulitis or not. Left heel is not closed. Left calf the reopening is really slightly longer and probably worse 1/13; in general things look better and smaller except for the left dorsal foot. Area on the left heel is just about closed, left buttock looks better only a small wound remains in the skin looks better [using Lotrisone] 1/20; the area on the left heel only has a few remaining open areas here. Left lateral calf about the same in terms of size, left dorsal foot slightly larger right lateral foot still not closed. The area on the left buttock has no open wound and the surrounding skin looks a lot better 1/27; the area on the left heel is closed. Left lateral calf better but still requiring extensive debridements. The  area on his left buttock is closed. He still has the open areas on the left dorsal foot which is slightly smaller in the right foot which is slightly expanded. We have been using Iodoflex on these areas as well 2/3; left heel is closed. Left lateral calf still requiring debridement using Iodoflex there is no open area on his left buttock however he has dry scaly skin over a large area of this. Not really responding well to the Lotrisone. Finally the areas on his dorsal feet at the level of the first second webspace are slightly smaller on the right and about the same on the left. Both of these vigorously debrided with Anasept and  gauze 2/10; left heel remains closed he has dry erythematous skin over the left buttock but there is no open wound here. Left lateral leg has come in and with. Still requiring debridement we have been using Iodoflex here. Finally the area on the left dorsal foot and right dorsal foot are really about the same extremely dry callused fissured areas. He does not yet have a dermatology appointment 2/17; left heel remains closed. He has a new open area on the left buttock. The area on the left lateral calf is bigger longer and still covered in necrotic debris. No major change in his foot areas bilaterally. I am awaiting for a dermatologist to look on this. We have been using ketoconazole I do not know that this is been doing any good at all. 2/24; left heel remains closed. The left buttock wound that was new reopening last week looks better. The left lateral calf appears better also although still requires debridement. The major area on his foot is the left first second also requiring debridement. We have been putting Prisma on all wounds. I do not believe that the ketoconazole has done too much good for his feet. He will use Lotrisone I am going to give him a 2-week course of terbinafine. We still do not have a dermatology appointment 3/2 left heel remains closed however there is skin over bone in this area I pointed this out to him today. The left buttock wound is epithelialized but still does not look completely stable. The area on the left leg required debridement were using silver collagen here. With regards to his feet we changed to Lotrisone last week and silver alginate. 3/9; left heel remains closed. Left buttock remains closed. The area on the right foot is essentially closed. The left foot remains unchanged. Slightly smaller on the left lateral calf. Using silver collagen to both of these areas 3/16-Left heel remains closed. Area on right foot is closed. Left lateral calf above the lateral  malleolus open wound requiring debridement with easy bleeding. Left dorsal wound proximal to first toe also debrided. Left ischial area open new. Patient has been using Prisma with wrapping every 3 days. Dermatology appointment is apparently tomorrow.Patient has completed his terbinafine 2-week course with some apparent improvement according to him, there is still flaking and dry skin in his foot on the left 3/23; area on the right foot is reopened. The area on the left anterior foot is about the same still a very necrotic adherent surface. He still has the area on the left leg and reopening is on the left buttock. He apparently saw dermatology although I do not have a note. According to the patient who is usually fairly well informed they did not have any good ideas. Put him on oral terbinafine which she is been on  before. 3/30; using silver collagen to all wounds. Apparently his dermatologist put him on doxycycline and rifampin presumably some culture grew staph. I do not have this result. He remains on terbinafine although I have used terbinafine on him before Dusenbury, Joffrey E ([id number]) [id number] Page 30 of 40 4/6; patient has had a fairly substantial reopening on the right foot between the first and second toes. He is finished his terbinafine and I believe is on doxycycline and rifampin still as prescribed by dermatology. We have been using silver collagen to all his wounds although the patient reports that he thinks silver alginate does better on the wounds on his buttock. 4/13; the area on his left lateral calf about the same size but it did not require debridement. Left dorsal foot just proximal to the webspace between the first and second toes is about the same. Still nonviable surface. I note some superficial bronze discoloration of the dorsal part of his foot Right dorsal foot just proximal to the first and second toes also looks about the same. I still think  there may be the same discoloration I noted above on the left Left buttock wound looks about the same 4/20; left lateral calf appears to be gradually contracting using silver collagen. 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 The left dorsal foot wound is debrided of tightly adherent necrotic debris and really cleans up quite nicely. The right area is worse with expansion. I did not debride this it is now over the base of the second toe The area on his left buttock is smaller no debridement is required using silver collagen 5/4; left calf continues to make good progress. He arrives with erythema around the wounds on his dorsal foot which even extends to the plantar aspect. Very concerning for coexistent infection. He is finished the erythromycin I gave him for possible erythrasma this does not seem to have helped. The area on the left foot is about the same base of the dorsal toes Is area on the buttock looks improved on the left 5/11; left calf and left buttock continued to make good progress. Left foot is about the same to slightly improved. Major problem is on the right foot. He has not had an x-ray. Deep tissue culture I did last week showed both Enterobacter and E. coli. I did not change the doxycycline I put him on empirically although neither 1 of these were plated to doxycycline. He arrives today with the erythema looking worse on both the dorsal and plantar foot. Macerated skin on the bottom of the foot. he has not been systemically unwell 5/18-Patient returns at 1 week, left calf wound appears to be making some progress, left buttock wound appears slightly worse than last time, left foot wound looks slightly better, right foot redness is marginally better. X-ray of both feet show no air or evidence of osteomyelitis. Patient is finished his Omnicef and terbinafine. He continues to have macerated skin on the bottom of the left foot  as well as right 5/26; left calf wound is better, left buttock wound appears to have multiple small superficial open areas with surrounding macerated skin. X-rays that I did last time showed no evidence of osteomyelitis in either foot. He is finished cefdinir and doxycycline. I do not think that he was on terbinafine. He continues to have a large superficial open area on the right foot anterior dorsal and slightly between the first and second toes. I did send him  to dermatology 2 months ago or so wondering about whether they would do a fungal scraping. I do not believe they did but did do a culture. We have been using silver alginate to the toe areas, he has been using antifungals at home topically either ketoconazole or Lotrisone. We are using silver collagen on the left foot, silver alginate on the right, silver collagen on the left lateral leg and silver alginate on the left buttock 6/1; left buttock area is healed. We have the left dorsal foot, left lateral leg and right dorsal foot. We are using silver alginate to the areas on both feet and silver collagen to the area on his left lateral calf 6/8; the left buttock apparently reopened late last week. He is not really sure how this happened. He is tolerating the terbinafine. Using silver alginate to all wounds 6/15; left buttock wound is larger than last week but still superficial. Came in the clinic today with a report of purulence from the left lateral leg I did not identify any infection Both areas on his dorsal feet appear to be better. He is tolerating the terbinafine. Using silver alginate to all wounds 6/22; left buttock is about the same this week, left calf quite a bit better. His left foot is about the same however he comes in with erythema and warmth in the right forefoot once again. Culture that I gave him in the beginning of May showed Enterobacter and E. coli. I gave him doxycycline and things seem to improve although neither 1 of  these organisms was specifically plated. 6/29; left buttock is larger and dry this week. Left lateral calf looks to me to be improved. Left dorsal foot also somewhat improved right foot completely unchanged. The erythema on the right foot is still present. He is completing the Ceftin dinner that I gave him empirically [see discussion above.) 7/6 - All wounds look to be stable and perhaps improved, the left buttock wound is slightly smaller, per patient bleeds easily, completed ceftin, the right foot redness is less, he is on terbinafine 7/13; left buttock wound about the same perhaps slightly narrower. Area on the left lateral leg continues to narrow. Left dorsal foot slightly smaller right foot about the same. We are using silver alginate on the right foot and Hydrofera Blue to the areas on the left. Unna boot on the left 2 layer compression on the right 7/20; left buttock wound absolutely the same. Area on lateral leg continues to get better. Left dorsal foot require debridement as did the right no major change in the 7/27; left buttock wound the same size necrotic debris over the surface. The area on the lateral leg is closed once again. His left foot looks better right foot about the same although there is some involvement now of the posterior first second toe area. He is still on terbinafine which I have given him for a month, not certain a centimeter major change 06/25/19-All wounds appear to be slightly improved according to report, left buttock wound looks clean, both foot wounds have minimal to no debris the right dorsal foot has minimal slough. We are using Hydrofera Blue to the left and silver alginate to the right foot and ischial wound. 8/10-Wounds all appear to be around the same, the right forefoot distal part has some redness which was not there before, however the wound looks clean and small. Ischial wound looks about the same with no changes 8/17; his wound on the left lateral calf  which was his original chronic venous insufficiency wound remains closed. Since I last saw him the areas on the left dorsal foot right dorsal foot generally appear better but require debridement. The area on his left initial tuberosity appears somewhat larger to me perhaps hyper granulated and bleeds very easily. We have been using Hydrofera Blue to the left dorsal foot and silver alginate to everything else 8/24; left lateral calf remains closed. The areas on his dorsal feet on the webspace of the first and second toes bilaterally both look better. The area on the left buttock which is the pressure ulcer stage II slightly smaller. I change the dressing to Hydrofera Blue to all areas 8/31; left lateral calf remains closed. The area on his dorsal feet bilaterally look better. Using Hydrofera Blue. Still requiring debridement on the left foot. No change in the left buttock pressure ulcers however 9/14; left lateral calf remains closed. Dorsal feet look quite a bit better than 2 weeks ago. Flaking dry skin also a lot better with the ammonium lactate I gave him 2 weeks ago. The area on the left buttock is improved. He states that his Roho cushion developed a leak and he is getting a new one, in the interim he is offloading this vigorously 9/21; left calf remains closed. Left heel which was a possible DTI looks better this week. He had macerated tissue around the left dorsal foot right foot looks satisfactory and improved left buttock wound. I changed his dressings to his feet to silver alginate bilaterally. Continuing Hydrofera Blue on the left buttock. 9/28 left calf remains closed. Left heel did not develop anything [possible DTI] dry flaking skin on the left dorsal foot. Right foot looks satisfactory. Improved left buttock wound. We are using silver alginate on his feet Hydrofera Blue on the buttock. I have asked him to go back to the Lotrisone on his feet including the wounds and surrounding  areas 10/5; left calf remains closed. The areas on the left and right feet about the same. A lot of this is epithelialized however debris over the remaining open areas. He is using Lotrisone and silver alginate. The area on the left buttock using Hydrofera Blue 10/26. Patient has been out for 3 weeks secondary to Covid concerns. He tested negative but I think his wife tested positive. He comes in today with the left foot substantially worse, right foot about the same. Even more concerning he states that the area on his left buttock closed over but then reopened and is considerably deeper in one aspect than it was before [stage III wound] 11/2; left foot really about the same as last week. Quarter sized wound on the dorsal foot just proximal to the first second toes. Surrounding erythema with areas of denuded epithelium. This is not really much different looking. Did not look like cellulitis this time however. Right foot area about the same.. We have been using silver alginate alginate on his toes Left buttock still substantial irritated skin around the wound which I think looks somewhat better. We have been using Hydrofera Blue here. 11/9; left foot larger than last week and a very necrotic surface. Right foot I think is about the same perhaps slightly smaller. Debris around the circumference also addressed. Unfortunately on the left buttock there is been a decline. Satellite lesions below the major wound distally and now a an additional one posteriorly we have been using Hydrofera Blue but I think this is a pressure issue 11/16; left foot ulcer dorsally  again a very adherent necrotic surface. Right foot is about the same. Not much change in the pressure ulcer on his left buttock. 11/30; left foot ulcer dorsally basically the same as when I saw him 2 weeks ago. Very adherent fibrinous debris on the wound surface. Patient reports a lot of drainage as well. The character of this wound has changed  completely although it has always been refractory. We have been using Iodoflex, patient changed back to alginate because of the drainage. Mancini, Shaquan E ([id number]) [id number] Page 31 of 40 Area on his right dorsal foot really looks benign with a healthier surface certainly a lot better than on the left. Left buttock wounds all improved using Hydrofera Blue 12/7; left dorsal foot again no improvement. Tightly adherent debris. PCR culture I did last week only showed likely skin contaminant. I have gone ahead and done a punch biopsy of this which is about the last thing in terms of investigations I can think to do. He has known venous insufficiency and venous hypertension and this could be the issue here. The area on the right foot is about the same left buttock slightly worse according to our intake nurse secondary to [facility] Blue sticking to the wound 12/14; biopsy of the left foot that I did last time showed changes that could be related to wound healing/chronic stasis dermatitis phenomenon no neoplasm. We have been using silver alginate to both feet. I change the one on the left today to Sorbact and silver alginate to his other 2 wounds 12/28; the patient arrives with the following problems; Major issue is the dorsal left foot which continues to be a larger deeper wound area. Still with a completely nonviable surface Paradoxically the area mirror image on the right on the right dorsal foot appears to be getting better. He had some loss of dry denuded skin from the lower part of his original wound on the left lateral calf. Some of this area looked a little vulnerable and for this reason we put him in wrap that on this side this week The area on his left buttock is larger. He still has the erythematous circular area which I think is a combination of pressure, sweat. This does not look like cellulitis or fungal dermatitis 11/26/2019; -Dorsal left foot large open wound  with depth. Still debris over the surface. Using Sorbact The area on the dorsal right foot paradoxically has closed over He has a reopening on the left ankle laterally at the base of his original wound that extended up into the calf. This appears clean. The left buttock wound is smaller but with very adherent necrotic debris over the surface. We have been using silver alginate here as well The patient had arterial studies done in 2017. He had biphasic waveforms at the dorsalis pedis and posterior tibial bilaterally. ABI in the left was 1.17. Digit waveforms were dampened. He has slight spasticity in the great toes I do not think a TBI would be possible 1/11; the patient comes in today with a sizable reopening between the first and second toes on the right. This is not exactly in the same location where we have been treating wounds previously. According to our intake nurse this was actually fairly deep but 0.6 cm. The area on the left dorsal foot looks about the same the surface is somewhat cleaner using Sorbact, his MRI is in 2 days. We have not managed yet to get arterial studies. The new reopening on the left lateral calf  looks somewhat better using alginate. The left buttock wound is about the same using alginate 1/18; the patient had his ARTERIAL studies which were quite normal. ABI in the right at 1.13 with triphasic/biphasic waveforms on the left ABI 1.06 again with triphasic/biphasic waveforms. It would not have been possible to have done a toe brachial index because of spasticity. We have been using Sorbac to the left foot alginate to the rest of his wounds on the right foot left lateral calf and left buttock 1/25; arrives in clinic with erythema and swelling of the left forefoot worse over the first MTP area. This extends laterally dorsally and but also posteriorly. Still has an area on the left lateral part of the lower part of his calf wound it is eschared and clearly not closed. Area on  the left buttock still with surrounding irritation and erythema. Right foot surface wound dorsally. The area between the right and first and second toes appears better. 2/1; The left foot wound is about the same. Erythema slightly better I gave him a week of doxycycline empirically Right foot wound is more extensive extending between the toes to the plantar surface Left lateral calf really no open surface on the inferior part of his original wound however the entire area still looks vulnerable Absolutely no improvement in the left buttock wound required debridement. 2/8; the left foot is about the same. Erythema is slightly improved I gave him clindamycin last week. Right foot looks better he is using Lotrimin and silver alginate He has a breakdown in the left lateral calf. Denuded epithelium which I have removed Left buttock about the same were using Hydrofera Blue 2/15; left foot is about the same there is less surrounding erythema. Surface still has tightly adherent debris which I have debriding however not making any progress Right foot has a substantial wound on the medial right second toe between the first and second webspace. Still an open area on the left lateral calf distal area. Buttock wound is about the same 2/22; left foot is about the same less surrounding erythema. Surface has adherent debris. Polymen Ag Right foot area significant wound between the first and second toes. We have been using silver alginate here Left lateral leg polymen Ag at the base of his original venous insufficiency wound Left buttock some improvement here 3/1; Right foot is deteriorating in the first second toe webspace. Larger and more substantial. We have been using silver alginate. Left dorsal foot about the same markedly adherent surface debris using PolyMem Ag Left lateral calf surface debris using PolyMem AG Left buttock is improved again using PolyMem Ag. He is completing his terbinafine. The  erythema in the foot seems better. He has been on this for 2 weeks 3/8; no improvement in any wound area in fact he has a small open area on the dorsal midfoot which is new this week. He has not gotten his foot x-rays yet 3/15; his x-rays were both negative for osteomyelitis of both feet. No major change in any of his wounds on the extremities however his buttock wounds are better. We have been using polymen on the buttocks, left lower leg. Iodoflex on the left foot and silver alginate on the right 3/22; arrives in clinic today with the 2 major issues are the improvement in the left dorsal foot wound which for once actually looks healthy with a nice healthy wound surface without debridement. Using Iodoflex here. Unfortunately on the left lateral calf which is in the distal part  of his original wound he came to the clinic here for there was purulent drainage noted some increased breakdown scattered around the original area and a small area proximally. We we are using polymen here will change to silver alginate today. His buttock wound on the left is better and I think the area on the right first second toe webspace is also improved 3/29; left dorsal foot looks better. Using Iodoflex. Left ankle culture from deterioration last time grew E. coli, Enterobacter and Enterococcus. I will give him a course of cefdinir although that will not cover Enterococcus. The area on the right foot in the webspace of the first and second toe lateral first toe looks better. The area on his buttock is about healed Vascular appointment is on April 21. This is to look at his venous system vis--vis continued breakdown of the wounds on the left including the left lateral leg and left dorsal foot he. He has had previous ablations on this side 4/5; the area between the right first and second toes lateral aspect of the first toe looks better. Dorsal aspect of the left first toe on the left foot also improved. Unfortunately the  left lateral lower leg is larger and there is a second satellite wound superiorly. The usual superficial abrasions on the left buttock overall better but certainly not closed 4/12; the area between the right first and second toes is improved. Dorsal aspect of the left foot also slightly smaller with a vibrant healthy looking surface. No real change in the left lateral leg and the left buttock wound is healed He has an unaffordable co-pay for Apligraf. Appointment with vein and vascular with regards to the left leg venous part of the circulation is on 4/21 4/19; we continue to see improvement in all wound areas. Although this is minor. He has his vascular appointment on 4/21. The area on the left buttock has not reopened although right in the center of this area the skin looks somewhat threatened 4/26; the left buttock is unfortunately reopened. In general his left dorsal foot has a healthy surface and looks somewhat smaller although it was not measured as such. The area between his first and second toe webspace on the right as a small wound against the first toe. The patient saw vascular surgery. The real question I was asking was about the small saphenous vein on the left. He has previously ablated left greater saphenous vein. Nothing further was commented on on the left. Right greater saphenous vein without reflux at the saphenofemoral junction or proximal thigh there was no indication for ablation of the right greater saphenous vein duplex was negative for DVT bilaterally. They did not think there was anything from a vascular surgery point of view that could be offered. They ABIs within normal limits 5/3; only small open area on the left buttock. The area on the left lateral leg which was his original venous reflux is now 2 wounds both which look clean. We Placide, Armend E ([id number]) [id number] Page 32 of 40 are using Iodoflex on the left dorsal foot which looks healthy  and smaller. He is down to a very tiny area between the right first and second toes, using silver alginate 5/10; all of his wounds appear better. We have much better edema control in 4 layer compression on the left. This may be the factor that is allowing the left foot and left lateral calf to heal. He has external compression garments at home 04/14/20-All of his wounds are  progressing well, the left forefoot is practically closed, left ischium appears to be about the same, right toe webspace is also smaller. The left lateral leg is about the same, continue using Hydrofera Blue to this, silver alginate to the ischium, Iodoflex to the toe space on the right 6/7; most of his wounds outside of the left buttock are doing well. The area on the left lateral calf and left dorsal foot are smaller. The area on the right foot in between the first and second toe webspace is barely visible although he still says there is some drainage here is the only reason I did not heal this out. Unfortunately the area on the left buttock almost looks like he has a skin tear from tape. He has open wound and then a large flap of skin that we are trying to get adherence over an area just next to the remaining wound 6/21; 2 week follow-up. I believe is been here for nurse visits. Miraculously the area between his first and second toes on the left dorsal foot is closed over. Still open on the right first second web space. The left lateral calf has 2 open areas. Distally this is more superficial. The proximal area had a little more depth and required debridement of adherent necrotic material. His buttock wound is actually larger we have been using silver alginate here 6/28; the patient's area on the left foot remains closed. Still open wet area between the first and second toes on the right and also extending into the plantar aspect. We have been using silver alginate in this location. He has 2 areas on the left lower leg part of his  original long wounds which I think are better. We have been using Hydrofera Blue here. Hydrofera Blue to the left buttock which is stable 7/12; left foot remains closed. Left ankle is closed. May be a small area between his right first and second toes the only truly open area is on the left buttock. We have been using Hydrofera Blue here 7/19; patient arrives with marked deterioration especially in the left foot and ankle. We did not put him in a compression wrap on the left last week in fact he wore his juxta lite stockings on either side although he does not have an underlying stocking. He has a reopening on the left dorsal foot, left lateral ankle and a new area on the right dorsal ankle. More worrisome is the degree of erythema on the left foot extending on the lateral foot into the lateral lower leg on the left 7/26; the patient had erythema and drainage from the lateral left ankle last week. Culture of this grew MRSA resistant to doxycycline and clindamycin which are the 2 antibiotics we usually use with this patient who has multiple antibiotic allergies including linezolid, trimethoprim sulfamethoxazole. I had give him an empiric doxycycline and he comes in the area certainly looks somewhat better although it is blotchy in his lower leg. He has not been systemically unwell. He has had areas on the left dorsal foot which is a reopening, chronic wounds on the left lateral ankle. Both of these I think are secondary to chronic venous insufficiency. The area between his first and second toes is closed as far as I can tell. He had a new wrap injury on the right dorsal ankle last week. Finally he has an area on the left buttock. We have been using silver alginate to everything except the left buttock we are using Hydrofera Blue  06/30/20-Patient returns at 1 week, has been given a sample dose pack of NUZYRA which is a tetracycline derivative [omadacycline], patient has completed those, we have been using  silver alginate to almost all the wounds except the left ischium where we are using Hydrofera Blue all of them look better 8/16; since I last saw the patient he has been doing well. The area on the left buttock, left lateral ankle and left foot are all closed today. He has completed the [place] I gave him last time and tolerated this well. He still has open areas on the right dorsal ankle and in the right first second toe area which we are using silver alginate. 8/23; we put him in his bilateral external compression stockings last week as he did not have anything open on either leg except for concerning area between the right first and second toe. He comes in today with an area on the left dorsal foot slightly more proximal than the original wound, the left lateral foot but this is actually a continuation of the area he had on the left lateral ankle from last time. As well he is opened up on the left buttock again. 8/30; comes in today with things looking a lot better. The area on the left lower ankle has closed down as has the left foot but with eschar in both areas. The area on the dorsal right ankle is also epithelialized. Very little remaining of the left buttock wound. We have been using silver alginate on all wound areas 9/13; the area in the first second toe webspace on the right has fully epithelialized. He still has some vulnerable epithelium on the right and the ankle and the dorsal foot. He notes weeping. He is using his juxta lite stocking. On the left again the left dorsal foot is closed left lateral ankle is closed. We went to the juxta lite stocking here as well. Still vulnerable in the left buttock although only 2 small open areas remain here 9/27; 2-week follow-up. We did not look at his left leg but the patient says everything is closed. He is a bit disturbed by the amount of edema in his left foot he is using juxta lite stockings but asking about over the toes stockings which would be  30/40, will talk to him next time. According to him there is no open wound on either the left foot or the left ankle/calf He has an open area on the dorsal right calf which I initially point a wrap injury. He has superficial remaining wound on the left ischial tuberosity been using silver alginate although he says this sticks to the wound 10/5; we gave him 2-week follow-up but he called yesterday expressing some concerns about his right foot right ankle and the left buttock. He came in early. There is still no open areas on the left leg and that still in his juxta lite stocking 10/11; he only has 1 small area on the left buttock that remains measuring millimeters 1 mm. Still has the same irritated skin in this area. We recommended zinc oxide when this eventually closes and pressure relief is meticulously is he can do this. He still has an area on the dorsal part of his right first through third toes which is a bit irritated and still open and on the dorsal ankle near the crease of the ankle. We have been using silver alginate and using his own stocking. He has nothing open on the left leg or foot 10/25;  2-week follow-up. Not nearly as good on the left buttock as I was hoping. For open areas with 5 looking threatened small. He has the erythematous irritated chronic skin in this area. 1 area on the right dorsal ankle. He reports this area bleeds easily Right dorsal foot just proximal to the base of his toes We have been using silver alginate. 11/8; 2-week follow-up. Left buttock is about the same although I do not think the wounds are in the same location we have been using silver alginate. I have asked him to use zinc oxide on the skin around the wounds. He still has a small area on the right dorsal ankle he reports this bleeds easily Right dorsal foot just proximal to the base of the toes does not have anything open although the skin is very dry and scaly He has a new opening on the nailbed of the  left great toe. Nothing on the left ankle 11/29; 3-week follow-up. Left buttock has 2 open areas. And washing of these wounds today started bleeding easily. Suggesting very friable tissue. We have been using silver alginate. Right dorsal ankle which I thought was initially a wrap injury we have been using silver alginate. Nothing open between the toes that I can see. He states the area on the left dorsal toe nailbed healed after the last visit in 2 or 3 days 12/13; 3-week follow-up. His left buttock now has 3 open areas but the original 2 areas are smaller using polymen here. Surrounding skin looks better. The right dorsal ankle is closed. He has a small opening on the right dorsal foot at the level of the third toe. In general the skin looks better here. He is wearing his juxta lite stocking on the left leg says there is nothing open 11/24/2020; 3 weeks follow-up. His left buttock still has the 3 open areas. We have been using polymen but due to lack of response he changed to [facility] area. Surrounding skin is dry erythematous and irritated looking. There is no evidence of infection either bacterial or fungal however there is loss of surface epithelium He still has very dry skin in his foot causing irritation and erythema on the dorsal part of his toes. This is not responded to prolonged courses of antifungal simply looks dry and irritated 1/24; left buttock area still looks about the same he was unable to find the triad ointment that we had suggested. The area on the right lower leg just above the dorsal ankle has reopened and the areas on the right foot between the first second and second third toes and scaling on the bottom of the foot has been about the same for quite some time now. been using silver alginate to all wound areas 2/7; left buttock wound looked quite good although not much smaller in terms of surface area surrounding skin looks better. Only a few dry flaking areas on the right  foot in between the first and second toes the skin generally looks better here [ammonium lactate]. Finally the area on the right dorsal ankle is closed 2/21; There is no open area on the right foot even between the right first and second toe. Skin around this area dorsally and plantar aspects look better. He has a reopening of the area on the right ankle just above the crease of the ankle dorsally. I continue to think that this is probably friction from spasms may be even this time with his stocking under the compression stockings. Wounds on  his left buttock look about the same there a couple of areas that have reopened. He has a total square area of loss of epithelialization. This does not look like infection it looks like a contact dermatitis but I just cannot determine to what 3/14; there is nothing on the right foot between the first and second toes this was carefully inspected under illumination. Some chronic irritation on the dorsal part of his foot from toes 1-3 at the base. Nothing really open here substantially. Still has an area on the right foot/ankle that is actually larger and hyper granulated. His buttock area on the left is just about closed however he has chronic inflammation with loss of the surface epithelial layer 3/28; 2-week follow-up. In clinic today with a new wound on the left anterior mid tibia. Says this happened about 2 weeks ago. He is not really sure how wonders Spada, Rayven E ([id number]) [id number] Page 33 of 40 about the spasticity of his legs at night whether that could have caused this other than that he does not have a good idea. He has been using topical antibiotics and silver alginate. The area on his right dorsal ankle seems somewhat better. Finally everything on his left buttock is closed. 4/11; 2-week follow-up. All of his wounds are better except for the area over the ischium and left buttock which have opened up widely again. At  least part of this is covered in necrotic fibrinous material another part had rolled nonviable skin. The area on the right ankle, left anterior mid tibia are both a lot better. He had no open wounds on either foot including the areas between the first and second toes 4/25; patient presents for 2-week follow-up. He states that the wounds are overall stable. He has no complaints today and states he is using Hydrofera Blue to open wounds. 5/9; have not seen this man in over a month. For my memory he has open areas on the left mid tibia and right ankle. T oday he has new open area on the right dorsal foot which we have not had a problem with recently. He has the sustained area on the left buttock He is also changed his insurance at the beginning of the year [employer]. We will need prior authorizations for debridement 5/23; patient presents for 2-week follow-up. He has prior authorizations for debridement. He denies any issues in the past 2 weeks with his wound care. He has been using Hydrofera Blue to all the wounds. He does report a circular rash to the upper left leg that is new. He denies acute signs of infection. 6/6; 2-week follow-up. The patient has open wounds on the left buttock which are worse than the last time I saw this about a month ago. He also has a new area to me on the left anterior mid tibia with some surrounding erythema. The area on the dorsal ankle on the right is closed but I think this will be a friction injury every time this area is exposed to either our wraps or his compression stockings caused by unrelenting spasms in this leg. 6/20; 2-week follow-up.  The patient has open wounds on the left buttock which is about the same. Using [facility] here. - The left mid tibia has a static amount of surrounding erythema. Also a raised area in the center. We have been using Hydrofera Blue here. Finally he has broken down in his dorsal right foot extending between the first and  second toes and going  to the base of the first and second toe webspace. I have previously assumed that this was severe venous hypertension 7/5; 2-week follow-up  The left buttock wound actually looks better. We are using Hydrofera Blue. He has extensive skin irritation around this area and I have not really been able to get that any better. I have tried Lotrisone i.e. antifungals and steroids. More most recently we have just been using Coloplast really looks about the same.  The left mid tibia which was new last week culture to have very resistant staph aureus. Not only methicillin-resistant but doxycycline resistant. The patient has a plethora of antibiotic allergies including sulfa, linezolid. I used topical bacitracin on this but he has not started this yet.  In addition he has an expanding area of erythema with a wound on the dorsal right foot. I did a deep tissue culture of this area today 7/12;  Left buttock area actually looks better surrounding skin also looks less irritated.  Left mid tibia looks about the same. He is using bacitracin this is not worse  Right dorsal foot looks about the same as well.  The left first toe also looks about the same 7/19; left buttock wound continues to improve in terms of open areas  Left mid tibia is still concerning amount of swelling he is using bacitracin  Dorsal left first toe somewhat smaller  Right dorsal foot somewhat smaller 7/25; left buttock wound actually continues to improve  Left mid tibia area has less swelling. I gave him all my samples of new Nuzyra. This seems to have helped although the wound is still open it. His abrasion closed by here  Left dorsal great toe really no better. Still a very nonviable surface  Right dorsal foot perhaps some better.  We have been using bacitracin and silver nitrate to the areas on his lower legs and Hydrofera Blue to the area on the buttock. 8/16  Disappointed that his left buttock wound is actually  more substantial. Apparently during the last nurse visit these were both very small. He has continued irritation to a large area of skin on his buttock. I have never been able to totally explain this although I think it some combination of the way he sits, pressure, moisture. He is not incontinent enough to contribute to this.  Left dorsal great toe still fibrinous debris on the surface that I have debrided today  Large area across the dorsal right toes.  The area on the left anterior mid tibia has less swelling. He completed the [place]. This does not look infected although the tissue is still fried 8/30; 2-week follow-up.  Left buttock areas not improved. We used Hydrofera Blue on this. Weeping wet with the surrounding erythema that I have not been able to control even with Lotrisone and topical Coloplast  Left dorsal great toe looks about the same  More substantial area again at the base of his toes on the left which is new this week.  Area across the dorsal right toes looks improved  The left anterior mid tibia looks like it is trying to close 9/13; 2-week follow-up. Using silver alginate on all of his wounds. The left dorsal foot does not look any better. He has the area on the dorsal toe and also the areas at the base of all of his toes 1 through 3. On the right foot he has a similar pattern in a similar area. He has the area on his left mid tibia that looks  fairly healthy. Finally the area on his left buttock looks somewhat bette 9/20; culture I did of the left foot which was a deep tissue culture last time showed E. coli he has erythema around this wound. Still a completely necrotic surface. His right dorsal foot looks about the same. He has a very friable surface to the left anterior mid tibia. Both buttock wounds look better. We have been using silver alginate to all wounds 10/4; he has completed the cephalexin that I gave him last time for the left foot. He is using topical gentamicin  under silver alginate silver alginate being applied to all the wounds. Unfortunately all the wounds look irritated on his dorsal right foot dorsal left foot left mid tibia. I wonder if this could be a silver allergy. I am going to change him to [facility] on the lower extremity. The skin on the left buttock and left posterior thigh still flaking dry and irritated. This has continued no matter what I have applied topically to this. He has a solitary open wound which by itself does not look too bad however the entire area of surrounding skin does not change no matter what we have applied here 10/18; the area on the left dorsal foot and right dorsal foot both look better. The area on the right extends into the plantar but not between his toes. We have been using silver alginate. He still has a rectangular erythematous area around the area on the left tibia. The wound itself is very small. Finally everything on his left buttock looks a little larger the skin is erythematous 11/15; patient comes in with the left dorsal and right dorsal foot distally looking somewhat better. Still nonviable surface on the left foot which required debridement. He still has the area on the left anterior mid tibia although this looks somewhat better. He has a new area on the right lateral lower leg just above the ankle. Finally his left buttock looks terrible with multiple superficial open wounds geometric square shaped area of chronic erythema which I have not been able to sort through 11/29; right dorsal foot and left dorsal foot both look somewhat better. No debridement required. He has the fragile area on the left anterior mid tibia this looks and continues to look somewhat better. Right lateral lower leg just above the ankle we identified last time also looks better. In general the area on his left buttock looks improved. We are using Hydrofera Blue to all wound areas 12/13; right dorsal foot looks better. The area on  the right lateral leg is healed. Left dorsal foot has 2 open areas both of which require debridement. The fragile area on the left anterior mid tibial looks better. Smaller area on his buttocks. Were using Hydrofera Blue 1/10; patient comes in with everything looking slightly larger and/or worse. This includes his left buttock, reopening of the left mid tibia, larger areas on the left dorsal foot and what looks to be a cellulitis on the right dorsal and plantar foot. We have been using Hydrofera Blue on all wounds. 1/17; right dorsal foot distally looks better today. The left foot has 2 open wounds that are about the same surrounding erythema. 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 No malignancy they did not stain for fungus Lagrand, Tadarrius E ([id number]) [id number] Page 34 of 40 1/24; his right distal foot is not closed dry  and scaly but the wound looks like it is contracting. I did not debride anything here. Problem on his left dorsal foot with expanding erythema. Apparently there were problems last week getting the Riki Altes however it is now available at the [facility] but a week later. He is using ketoconazole and Coloplast to the left buttock along with Hydrofera Blue this actually looks quite a bit better today. 1/31; right dorsal foot again is dry and scaly but looking to contract. He has been using a moisturizer on his feet at my request but he is not sure which 1. The left dorsal foot wounds look about the same there is erythema here that I marked last week however after course of Nuzyra it certainly is not any better but not any worse either. Finally on his left buttock the skin continues to look better he has the original wound but a new substantial area towards the gluteal cleft. Almost like a skin tear. I used scissors  to remove skin and subcutaneous tissue here silver nitrate and direct pressure 2/7; right dorsal foot. This does not look too much different from last week. Some erythema skin dry and scaly. No debridement. Left dorsal great toe again still not much improvement. I did remove flaking dry skin and callus from around the edge. Finally on his left buttock. The skin is somewhat better in the periwound. Surface wounds are superficial somewhat better than last week. 01/26/2022: Is a little bit of a mystery as to why his wounds fail to respond to treatments and actually seem to get worse. This is my first encounter with this patient. He was previously followed by Dr. Leanord Hawking. Based upon my review of the chart, it seems that there is a little bit of a mystery as to why his wounds do not respond as anticipated to the interventions applied and sometimes even get worse. Biopsies have been performed and he was seen by dermatology in [place], but that did not shed any light on the matter. T oday, his gluteal wound is larger, with substantial drainage, rather malodorous. The food wounds are not terrible, but he has a lot of callus and scaly skin around these. He is currently getting silver alginate on the gluteal wound, with idodoflex to the feet. He is using lotrisone on his legs for the dry, scaly skin. 02/09/2022: There has really been no change to any of his wounds. The gluteal wound less drainage and odor, but remains about the same size, the periwound skin remains oddly scaly. His lower extremity wounds also appear roughly the same size. They continue to accumulate a small amount of slough. The periwound on his feet and ankle wounds has dry eschar and loose dead skin. We have been using silver alginate on the gluteal wound and Iodoflex on his feet and ankle wounds. T the periwound around his gluteal lesion and Lotrisone on his feet and legs. o 02/23/2022: The right plantar foot wound is closed. The gluteal site  looks small but has continued to produce hypertrophic granulation tissue. The foot wounds all look about the same on the dorsal surface of the right foot; on the left, there is only a small open area at the site of where his left great toenail would have been. 03/16/2022: The right ankle wound is healed. The right dorsal foot wound is about the same. The left dorsal foot wound is quite a bit smaller and the ischial wound is nearly closed. 03/30/2022: The right ankle wound reopened. Both  dorsal foot wounds are quite a bit smaller. Unfortunately, he appears to have sheared part of his ischial wound open further, perhaps during a transfer. 04/13/2022: The right ankle wound has hypertrophic granulation tissue present. The dorsal foot wounds continue to decrease in size. The ischial wound looks about the same today, no better, no worse. 04/27/2022: The right ankle wound has closed. Unfortunately, it looks like some moisture got underneath the dry skin on both of his dorsal feet and these wounds have expanded in size. The ischial wound remains the same with perhaps a little bit more slough accumulation than at our previous visit. 05/11/2022: The right ankle wound remains closed. There is a left anterior tibial wound that is small has patchy openings with accumulated slough. The dorsum of his right foot appears to be nearly healed with just a small punctate opening. The plantar surface of his right foot has a new opening that looks like he may have picked some skin there. His sacral ulcer has hypertrophic granulation tissue but has some slough accumulation. The dorsum of his left foot has multiple open areas in a fairly ragged distribution. All of these have slough accumulated within them. 06/01/2022: The right ankle and left anterior tibial wound are both closed. Dorsum of his right foot and left foot both look substantially better with just tiny scattered openings The without any slough accumulation. He has  sheared open new areas on his left gluteus and ischium. He says that his wheelchair cushion, which is air-filled, has a leak and so it keeps deflating. He is awaiting a new cushion. 06/15/2022: The right ankle wound has reopened and the fat layer is exposed. Both dorsal feet have just small openings with just a little bit of slough and eschar accumulation. The wound on his left gluteus and ischium is larger again today and has a foul odor. 06/29/2022: The right ankle wound has hypertrophic granulation tissue buildup. His dorsal foot wounds both look better with just some eschar on the surface. He has a new wound on his left lateral ankle. He is not sure how he acquired it but by appearance, it looks that he hit it on something, potentially his wheelchair or bed. The ischial wound is about the same but is cleaner without any significant purulent drainage or odor. He did not understand what the [facility] call was about and therefore he does not have the topical compounded antibiotic. 07/13/2022: The right ankle wound again has hypertrophic granulation tissue, but less so than at his previous visit. The ischial wound has improved tremendously the use of the [facility] topical antibiotic. No significant change to the left lateral ankle wound; it is fibrotic with slough present. The skin of both of his feet, especially on the right, has a yeasty appearance. 08/10/2022: There is again hypertrophic granulation tissue on the right anterior ankle wound. Both feet are about the same. The left lateral ankle wound is a little bit desiccated and has some slough buildup. He unfortunately suffered a new injury when he was removing his pants and they caught his bandage which caused a large skin tear on his left ischium, just distal to the existing wound. The existing left ischial wound, however, is significantly better with just a little light slough on the surface. 08/31/2022: The right anterior ankle wound is a lot smaller  today underneath some eschar. No accumulation of hypertrophic granulation tissue. The left lateral ankle wound has some slough on the surface but is better in terms of moisture balance  this week. The left dorsal foot does not really have any openings on it today. The right dorsal foot has some slough and eschar accumulation. His gluteal ulcer is basically closed aside from 2 small areas that are oozing a bit. 09/21/2022: The right anterior ankle wound is down to just a tiny pinhole. The left lateral ankle wound has accumulated slough again, but moisture balance is good. Left and right dorsal feet both have 2 small openings with some slough in them. The gluteal ulcer, unfortunately has opened up substantially. 10/05/2022: The right anterior ankle wound has reopened and has a fair amount of slough on the surface. The left lateral ankle wound has accumulated more slough, as well. He was approved for Apligraf, but the wound is not clean enough yet. There is some slough buildup on the dorsal right foot wound, as well as on his ischium. 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 10/26/2022: The right anterior ankle wound has closed again. The left lateral ankle wound looks quite a bit better. It is ready for Apligraf but we do not have 1 here for him. His ischium continues to get worse, with the wound larger and deeper. It really seems like this is a combination of pressure and friction. He has 2 new wounds, 1 on the plantar aspect of each foot. He says that he had some dry skin there that peeled away. Both are superficial. The dorsum of his left foot does not have any new wound opening. The dorsum of his right foot has a bit of slough accumulation. 11/24/2022: The right anterior ankle wound has 2 small open areas, both covered with eschar. The left lateral ankle wound has closed and considerably with just a little slough on the surface. The ischium has  improved most significantly, having closed in most of the open area with just a few small remaining superficial openings. The dorsal foot wounds are small and superficial. The bottoms of his feet are closed. Heuer, Richardo E ([id number]) [id number] Page 35 of 40 12/07/2022: The right anterior ankle wound remains open with 2 small areas, again with slough and eschar present. The left lateral ankle wound is down to about half a centimeter with just some slough on the surface. His right dorsal foot has opened up more and has both slough and eschar present. The ischium has continued to improve and now just has 2 small open areas that are very clean. 12/21/2022: He has a new wound on his right lateral ankle. There is some slough present. The left lateral ankle is quite a bit smaller with some slough buildup. His left ischial ulcer is also nearly closed; there is just a tear in his skin that seems likely to be secondary to a transfer mishap. He has a new ulcer in his natal cleft that looks like it may be secondary to moisture. There is some slough in this area as well. The right anterior ankle is nearly closed. The right dorsal foot wound has some slough accumulation 01/04/2023: The anterior right ankle wound is closed, as is his ischial ulcer. The left lateral ankle wound is nearly closed with just a little bit of slough accumulation. The ulcer in his natal cleft is about the same size with slough buildup there, as well. He has a new wound on his left lateral leg near the knee from rubbing on his wheelchair. There is slough present, but no signs of infection.  The right dorsal foot wound has a fairly heavy layer of slough buildup, as well. 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 He has been using his old wheelchair cushion for reasons that are unclear. The ulcer in his natal cleft has healed. The wound on his left lateral leg near the knee is smaller with some slough  present. The right dorsal foot wound has accumulated slough again. The right lateral ankle wound is healed. The left lateral ankle wound is smaller again this week. 02/01/2023: His ischial ulcer has had a ton of drainage over the last 2 weeks and the drainage has an absolutely putrid odor. He has gone back to using his new wheelchair chair cushion, though. The wound on his left lateral leg near his knee has healed. The left lateral ankle wound is nearly healed. Both the dorsal foot wounds are larger and have a lot of slough accumulation and drainage. The wound on his right anterior ankle is healed but there is a new 1 just proximal to this, also with slough and eschar present. 02/15/2023:The left lateral and right lateral ankle wounds are both smaller. The right anterior ankle wound has some eschar on the surface. Both dorsal feet have a lot of slough accumulation. The ischial ulcer has epithelialized in multiple areas, leaving several smaller areas that are quite friable and still with a pungent odor. His new [facility] prescription should be delivered tomorrow. We are using silver alginate on everything at this point. 03/08/2023: Both dorsal foot wounds are about the same size and have a lot of slough buildup. The ischial ulcer has improved quite a bit. The odor has abated and the surfaces are clean. It is still quite friable and 1 area is oozing. The left lateral and right lateral ankle wounds are nearly closed. The right anterior ankle wound is healed. 03/22/2023: The right lateral ankle wound is healed. The left lateral ankle wound is tiny with just a little bit of slough accumulation. The dorsal foot wounds look about the same. The ischial ulcer is considerably smaller and there is no odor. There is slight slough accumulation. 04/14/2023: The right lateral ankle wound has reopened. It is fairly superficial but does have some hematoma in the tissue suggesting trauma or pressure as the etiology. The left  lateral ankle wound is essentially a pinhole under some dry eschar. The dorsal foot wounds are stable. The ischial ulcer, unfortunately, has expanded to 5 separate openings in his skin. The wounds are clean but friable. 04/28/2023: The left lateral ankle wound has expanded and is a little bit deeper today. Dorsal foot wounds are stable. The ischial ulcer is less friable and does not have the strong odor that has been present. The right lateral ankle wound is a little bit smaller with some slough and eschar accumulation. 05/10/2023: The total area of the ischial wound is smaller. The left lateral ankle wound is nearly closed. Both dorsal foot wounds are quite a bit smaller. The right ankle wound has healed. 05/31/2023: The left lateral leg wound is down to just a superficial small area that almost looks like a scrape rather than the deeper ulcer it has been. His ischial ulcer is down to just 1 open spot. It is clean but quite friable. Both dorsal foot ulcers are considerably smaller and have much less slough than usual. 06/21/2023: His ischial ulcer is still just 1 open area, but it is a little larger today. The surrounding skin, however, is in better condition Unfortunately,  the left lateral leg wound is a little bit larger today. It looks as though some dry skin was peeled away. Both dorsal foot ulcers continue to contract; they are the smallest I have ever seen them and have only minimal slough on the surface. 07/12/2023: All of his wounds have deteriorated and his right anterior ankle ulcer is open again. He has had a lot more drainage, although he states it is clear and does not have any significant odor. This is resulted in breakdown around the left lateral ankle ulcer, both dorsal feet and has extended to the plantar surface of both feet. His ischial ulcer is also larger; this appears to be more due to shear than anything else. 08/02/2023: All of his wounds look better. There does not appear to be any  rhyme or reason as to why they wax and wane as they do. 08/23/2023: The right dorsal ankle wound is covered with a extremely thin layer of skin but is still purple underneath. The left lateral ankle wound has quite a bit of slough on it and it appears that the tendon is exposed again. His ischial ulcer is down to 4 open areas, all of which are smaller than previously, but the surfaces remain friable and have some slough on them today. The dorsal foot wounds are both considerably smaller and the right plantar ulcer is closed. The left plantar ulcer is nearly closed. 09/26/2023: The right dorsal ankle wound is basically healed. The left lateral ankle wound is smaller. His ischial ulcers have merged and are a bit larger today. Both dorsal foot wounds are stable. The left plantar ulcer is closed but the right plantar ulcer has reopened. 12/3; the patient comes in today for what I gather is monthly follow-up. Unfortunately things are are not going very well. He has multiple wounds including on his right dorsal foot left dorsal foot left lateral lower extremity left posterior lower extremity left buttock and right anterior lower extremity. The buttock wound which I gather is a pressure ulcer in this paraplegic patient is quite a bit worse at the top and area of necrotic granulation tissue. He has been using mupirocin silver alginate to all the wounds and his own compression stockings in his legs. I am not sure how much he keeps his legs elevated but he has been coached on this before 11/07/2023: Apparently the buttock area looks better today than it did at his previous visit. There is no necrosis present. It is still larger than when I saw it in November. The left lateral ankle wound is larger and appears deeper under a thick layer of slough. Both feet have deteriorated and the wounds are larger with slough build up. The right anterior ankle wound is open again. Objective Constitutional Tachycardic,  asymptomatic. no acute distress. Vitals Time Taken: 10:23 AM, Height: 70 in, Weight: 216 lbs, BMI: 31, Temperature: 98.3 F, Pulse: 130 bpm, Respiratory Rate: 18 breaths/min, Blood Pressure: 129/71 mmHg. Basic, Stephen E ([id number]) [id number] Page 36 of 40 Respiratory Normal work of breathing on room air.. General Notes: 11/07/2023: Apparently the buttock area looks better today than it did at his previous visit. There is no necrosis present. It is still larger than when I saw it in November. The left lateral ankle wound is larger and appears deeper under a thick layer of slough. Both feet have deteriorated and the wounds are larger with slough build up. The right anterior ankle wound is open again. Integumentary (Hair, Skin) Wound #52 status is  Open. Original cause of wound was Gradually Appeared. The date acquired was: 03/27/2021. The wound has been in treatment 136 weeks. The wound is located on the Right,Dorsal Foot. The wound measures 4.5cm length x 8.2cm width x 0.1cm depth; 28.981cm^2 area and 2.898cm^3 volume. There is Fat Layer (Subcutaneous Tissue) exposed. There is no tunneling or undermining noted. There is a large amount of serous drainage noted. The wound margin is distinct with the outline attached to the wound base. There is medium (34-66%) red, pink granulation within the wound bed. There is a medium (34-66%) amount of necrotic tissue within the wound bed including Adherent Slough. The periwound skin appearance had no abnormalities noted for texture. The periwound skin appearance had no abnormalities noted for color. The periwound skin appearance exhibited: Maceration. The periwound skin appearance did not exhibit: Dry/Scaly. Periwound temperature was noted as No Abnormality. Wound #56 status is Open. Original cause of wound was Gradually Appeared. The date acquired was: 07/11/2021. The wound has been in treatment 119 weeks. The wound is located on the  Left,Dorsal Foot. The wound measures 4.9cm length x 6cm width x 0.1cm depth; 23.091cm^2 area and 2.309cm^3 volume. There is Fat Layer (Subcutaneous Tissue) exposed. There is no tunneling or undermining noted. There is a large amount of serous drainage noted. The wound margin is flat and intact. There is small (1-33%) pink granulation within the wound bed. There is a large (67-100%) amount of necrotic tissue within the wound bed including Adherent Slough. The periwound skin appearance exhibited: Excoriation, Maceration, Rubor. The periwound skin appearance did not exhibit: Dry/Scaly, Erythema. Periwound temperature was noted as No Abnormality. Wound #67 status is Open. Original cause of wound was Gradually Appeared. The date acquired was: 06/22/2022. The wound has been in treatment 70 weeks. The wound is located on the Left,Lateral Lower Leg. The wound measures 2.2cm length x 1cm width x 0.1cm depth; 1.728cm^2 area and 0.173cm^3 volume. There is Fat Layer (Subcutaneous Tissue) exposed. There is no tunneling or undermining noted. There is a medium amount of serous drainage noted. The wound margin is distinct with the outline attached to the wound base. There is no granulation within the wound bed. There is a large (67-100%) amount of necrotic tissue within the wound bed including Adherent Slough. The periwound skin appearance had no abnormalities noted for color. The periwound skin appearance exhibited: Scarring, Dry/Scaly. Periwound temperature was noted as No Abnormality. Wound #74 status is Open. Original cause of wound was Pressure Injury. The date acquired was: 01/10/2023. The wound has been in treatment 41 weeks. The wound is located on the Left,Posterior Upper Leg. The wound measures 12.4cm length x 5.2cm width x 0.1cm depth; 50.642cm^2 area and 5.064cm^3 volume. There is Fat Layer (Subcutaneous Tissue) exposed. There is no tunneling or undermining noted. There is a medium amount of serosanguineous  drainage noted. The wound margin is flat and intact. There is large (67-100%) red, friable granulation within the wound bed. There is a small (1-33%) amount of necrotic tissue within the wound bed including Adherent Slough. The periwound skin appearance had no abnormalities noted for moisture. The periwound skin appearance had no abnormalities noted for color. The periwound skin appearance exhibited: Scarring. Periwound temperature was noted as No Abnormality. Wound #78 status is Open. Original cause of wound was Gradually Appeared. The date acquired was: 07/04/2023. The wound has been in treatment 16 weeks. The wound is located on the Right,Anterior Ankle. The wound measures 0.5cm length x 0.5cm width x 0.1cm depth; 0.196cm^2 area  and 0.02cm^3 volume. There is Fat Layer (Subcutaneous Tissue) exposed. There is no tunneling or undermining noted. There is a small amount of serosanguineous drainage noted. The wound margin is flat and intact. There is large (67-100%) red granulation within the wound bed. There is no necrotic tissue within the wound bed. The periwound skin appearance had no abnormalities noted for moisture. The periwound skin appearance had no abnormalities noted for color. The periwound skin appearance exhibited: Scarring. Periwound temperature was noted as No Abnormality. Wound #81 status is Open. Original cause of wound was Gradually Appeared. The date acquired was: 09/23/2023. The wound has been in treatment 6 weeks. The wound is located on the Right,Plantar Foot. The wound measures 1.5cm length x 3.8cm width x 0.1cm depth; 4.477cm^2 area and 0.448cm^3 volume. There is Fat Layer (Subcutaneous Tissue) exposed. There is no tunneling or undermining noted. There is a medium amount of serous drainage noted. The wound margin is flat and intact. There is small (1-33%) pink granulation within the wound bed. There is a large (67-100%) amount of necrotic tissue within the wound bed including  Adherent Slough. The periwound skin appearance had no abnormalities noted for texture. The periwound skin appearance had no abnormalities noted for color. The periwound skin appearance exhibited: Maceration. Periwound temperature was noted as No Abnormality. Wound #82 status is Open. Original cause of wound was Pressure Injury. The date acquired was: 11/04/2023. The wound is located on the Left,Proximal,Lateral Lower Leg. The wound measures 1.4cm length x 2.7cm width x 0.1cm depth; 2.969cm^2 area and 0.297cm^3 volume. There is Fat Layer (Subcutaneous Tissue) exposed. There is no tunneling or undermining noted. There is a medium amount of serosanguineous drainage noted. The wound margin is indistinct and nonvisible. There is no granulation within the wound bed. There is a large (67-100%) amount of necrotic tissue within the wound bed including Eschar and Adherent Slough. The periwound skin appearance had no abnormalities noted for texture. The periwound skin appearance had no abnormalities noted for moisture. The periwound skin appearance had no abnormalities noted for color. Periwound temperature was noted as No Abnormality. 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 There was a Excisional Skin/Subcutaneous Tissue Debridement with a total area of 28.97 sq cm performed by Duanne Guess, MD. With the  following instrument(s): Curette to remove Viable and Non-Viable tissue/material. Material removed includes Subcutaneous Tissue, Slough, and Skin: Epidermis after achieving pain control using Lidocaine 4% Topical Solution. No specimens were taken. A time out was conducted at 11:05, prior to the start of the procedure. A Minimum amount of bleeding was controlled with Pressure. The procedure was tolerated well with a pain level of Insensate throughout and a pain level of Insensate following the procedure. Post Debridement Measurements: 4.5cm length x 8.2cm width x 0.1cm depth; 2.898cm^3 volume. Character of Wound/Ulcer Post Debridement is improved. Severity of Tissue Post Debridement is: Fat layer exposed. 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 There was a Excisional Skin/Subcutaneous Tissue Debridement with a total area of 23.08 sq cm performed by Duanne Guess, MD. With the following instrument(s): Curette to remove Viable and Non-Viable tissue/material. Material removed includes Subcutaneous Tissue, Slough, and Skin: Epidermis after achieving pain control using Lidocaine 4% T opical Solution. No specimens were taken. A time out was conducted at 11:05, prior to the start of the procedure. A Minimum amount of bleeding was controlled with Pressure. The procedure was tolerated well with a pain level of Insensate throughout and a pain level of Insensate following the procedure. Post Debridement Measurements: 4.9cm length x 6cm width x 0.1cm depth; 2.309cm^3 volume. Character of Wound/Ulcer Post Debridement is improved. Post procedure Diagnosis Wound #56: Same as Pre-Procedure Wound #67 Pre-procedure diagnosis of Wound #67 is a Venous Leg Ulcer located on the Left,Lateral Lower Leg  .Severity of Tissue Pre Debridement is: Fat layer exposed. There was a Excisional Skin/Subcutaneous Tissue/Muscle Debridement with a total area of 1.73 sq cm performed by Duanne Guess, MD. With the following instrument(s): Curette to remove Viable and Non-Viable tissue/material. Material removed includes Muscle, Subcutaneous Tissue, and Slough after achieving pain control using Lidocaine 4% Topical Solution. No specimens were taken. A time out was conducted at 11:05, prior to the start of the procedure. A Minimum amount of bleeding was controlled with Pressure. The procedure was tolerated well with a pain level of Insensate throughout and a pain level of Insensate following the procedure. Post Debridement Measurements: 2.2cm length x 1cm width x 0.1cm depth; 0.173cm^3 volume. Character of Wound/Ulcer Post Debridement is improved. Severity of Tissue Post Debridement is: Necrosis of muscle. Post procedure Diagnosis Wound #67: Same as Pre-Procedure Wound #74 Pre-procedure diagnosis of Wound #74 is a Pressure Ulcer located on the Left,Posterior Upper Leg . There was a Excisional Skin/Subcutaneous Tissue Debridement with a total area of 25.31 sq cm performed by Duanne Guess, MD. With the following instrument(s): Curette to remove Viable and Non-Viable tissue/material. Material removed includes Subcutaneous Tissue and Slough and after achieving pain control using Lidocaine 4% T opical Solution. No specimens were taken. A time out was conducted at 11:05, prior to the start of the procedure. A Minimum amount of bleeding was controlled with Pressure. The procedure was tolerated well with a pain level of Insensate throughout and a pain level of Insensate following the procedure. Post Debridement Measurements: 12.4cm length x 5.2cm width x 0.1cm depth; 5.064cm^3 volume. Post debridement Stage noted as Category/Stage III. Character of Wound/Ulcer Post Debridement is improved. Post procedure Diagnosis  Wound #74: Same as Pre-Procedure Wound #81 Pre-procedure diagnosis of Wound #81 is a Lymphedema located on the Right,Plantar Foot . There was a Excisional Skin/Subcutaneous Tissue Debridement with a total area of 4.47 sq cm performed by Duanne Guess, MD. With the following instrument(s): Curette to remove Viable and Non-Viable tissue/material. Material removed includes Subcutaneous Tissue, Slough, and Skin: Epidermis after achieving pain control using Lidocaine 4% T opical Solution. No specimens were taken. A time out was conducted at 11:05, prior to the start of the procedure. A Minimum amount of bleeding was controlled with Pressure. The procedure was tolerated well with a pain level of Insensate throughout and a pain level of Insensate following the procedure. Post Debridement Measurements: 1.5cm length x 3.8cm width x 0.1cm depth; 0.448cm^3 volume. Character of Wound/Ulcer Post Debridement is improved. Post procedure Diagnosis Wound #81: Same as  Pre-Procedure Wound #82 Pre-procedure diagnosis of Wound #82 is a Pressure Ulcer located on the Left,Proximal,Lateral Lower Leg . There was a Excisional Skin/Subcutaneous Tissue Debridement with a total area of 2.97 sq cm performed by Duanne Guess, MD. With the following instrument(s): Curette to remove Viable and Non-Viable tissue/material. Material removed includes Subcutaneous Tissue, Slough, and Skin: Epidermis after achieving pain control using Lidocaine 4% T opical Solution. No specimens were taken. A time out was conducted at 11:05, prior to the start of the procedure. A Minimum amount of bleeding was controlled with Pressure. The procedure was tolerated well with a pain level of Insensate throughout and a pain level of Insensate following the procedure. Post Debridement Measurements: 1.4cm length x 2.7cm width x 0.1cm depth; 0.297cm^3 volume. Post debridement Stage noted as Unstageable/Unclassified. Character of Wound/Ulcer Post  Debridement is improved. Post procedure Diagnosis Wound #82: Same as Pre-Procedure Plan Follow-up Appointments: Return Appointment in 2 weeks. - Dr. Lady Gary ***allow extra time*** Anesthetic: Wound #52 Right,Dorsal Foot: (In clinic) Topical Lidocaine 4% applied to wound bed Bathing/ Shower/ Hygiene: May shower and wash wound with soap and water. - on days that dressing is changed Off-Loading: Roho cushion for wheelchair - use newer cushion Turn and reposition every 2 hours - lift up with arms in wheelchair every hour during the day WOUND #52: - Foot Wound Laterality: Dorsal, Right Cleanser: Soap and Water Every Other Day/30 Days Discharge Instructions: May shower and wash wound with dial antibacterial soap and water prior to dressing change. Cleanser: Wound Cleanser Every Other Day/30 Days Discharge Instructions: Cleanse the wound with wound cleanser prior to applying a clean dressing using gauze sponges, not tissue or cotton balls. Peri-Wound Care: Sween Lotion (Moisturizing lotion) Every Other Day/30 Days Discharge Instructions: Apply Aquaphor moisturizing lotion as directed Topical: Mupirocin Ointment Every Other Day/30 Days Discharge Instructions: Apply Mupirocin (Bactroban) as instructed Prim Dressing: Maxorb Extra Ag+ Alginate Dressing, 2x2 (in/in) Every Other Day/30 Days ary Discharge Instructions: Apply to wound bed as instructed Secondary Dressing: Woven Gauze Sponge, Non-Sterile 4x4 in Every Other Day/30 Days Discharge Instructions: Apply over primary dressing as directed. Secured With: [employer], 4.5x3.1 (in/yd) (Generic) Every Other Day/30 Days Discharge Instructions: Secure with Kerlix as directed. Secured With: 59M Medipore H Soft Cloth Surgical T ape, 4 x 10 (in/yd) (Generic) Every Other Day/30 Days Discharge Instructions: Secure with tape as directed. Happe, Jencarlo E ([id number]) [id number] Page 38 of 40 WOUND #56: - Foot Wound  Laterality: Dorsal, Left Cleanser: Soap and Water Every Other Day/30 Days Discharge Instructions: May shower and wash wound with dial antibacterial soap and water prior to dressing change. Cleanser: Wound Cleanser Every Other Day/30 Days Discharge Instructions: Cleanse the wound with wound cleanser prior to applying a clean dressing using gauze sponges, not tissue or cotton balls. Peri-Wound Care: Sween Lotion (Moisturizing lotion) Every Other Day/30 Days Discharge Instructions: Apply Aquaphor moisturizing lotion as directed Topical: Mupirocin Ointment Every Other Day/30 Days Discharge Instructions: Apply Mupirocin (Bactroban) as instructed Prim Dressing: Maxorb Extra Ag+ Alginate Dressing, 2x2 (in/in) Every Other Day/30 Days ary Discharge Instructions: Apply to wound bed as instructed Secondary Dressing: Woven Gauze Sponge, Non-Sterile 4x4 in Every Other Day/30 Days Discharge Instructions: Apply over primary dressing as directed. Secured With: [employer], 4.5x3.1 (in/yd) (Generic) Every Other Day/30 Days Discharge Instructions: Secure with Kerlix as directed. Secured With: 59M Medipore H Soft Cloth Surgical T ape, 4 x 10 (in/yd) (Generic) Every Other Day/30 Days Discharge Instructions: Secure with tape as directed. WOUND #67: -  Lower Leg Wound Laterality: Left, Lateral Cleanser: Soap and Water Every Other Day/30 Days Discharge Instructions: May shower and wash wound with dial antibacterial soap and water prior to dressing change. Cleanser: Wound Cleanser Every Other Day/30 Days Discharge Instructions: Cleanse the wound with wound cleanser prior to applying a clean dressing using gauze sponges, not tissue or cotton balls. Peri-Wound Care: Sween Lotion (Moisturizing lotion) Every Other Day/30 Days Discharge Instructions: Apply Aquaphor moisturizing lotion as directed Topical: Mupirocin Ointment Every Other Day/30 Days Discharge Instructions: Apply Mupirocin (Bactroban) as  instructed Prim Dressing: Maxorb Extra Ag+ Alginate Dressing, 2x2 (in/in) Every Other Day/30 Days ary Discharge Instructions: Apply to wound bed as instructed Secondary Dressing: Woven Gauze Sponge, Non-Sterile 4x4 in Every Other Day/30 Days Discharge Instructions: Apply over primary dressing as directed. Secured With: [employer], 4.5x3.1 (in/yd) (Generic) Every Other Day/30 Days Discharge Instructions: Secure with Kerlix as directed. Secured With: 18M Medipore H Soft Cloth Surgical T ape, 4 x 10 (in/yd) (Generic) Every Other Day/30 Days Discharge Instructions: Secure with tape as directed. WOUND #74: - Upper Leg Wound Laterality: Left, Posterior Cleanser: Vashe 5.8 (oz) 1 x Per Day/30 Days Discharge Instructions: Cleanse the wound with Vashe prior to applying a clean dressing using gauze sponges , let sit on wound for 10 minutes Peri-Wound Care: Zinc Oxide Ointment 30g tube 1 x Per Day/30 Days Discharge Instructions: Apply Zinc Oxide to periwound with each dressing change as needed fpr moisture Topical: Mupirocin Ointment 1 x Per Day/30 Days Discharge Instructions: Apply Mupirocin (Bactroban) as instructed Topical: [facility] antibiotic compound 1 x Per Day/30 Days Prim Dressing: Maxorb Extra Ag+ Alginate Dressing, 4x4.75 (in/in) 1 x Per Day/30 Days ary Discharge Instructions: Apply to wound bed as instructed Secondary Dressing: Zetuvit Plus Silicone Border Sacrum Dressing, Sm, 7x7 (in/in) 1 x Per Day/30 Days Discharge Instructions: Apply silicone border over primary dressing as directed. Secured With: 18M Medipore H Soft Cloth Surgical T ape, 4 x 10 (in/yd) 1 x Per Day/30 Days Discharge Instructions: Secure with tape as directed. WOUND #78: - Ankle Wound Laterality: Right, Anterior Cleanser: Soap and Water Every Other Day/30 Days Discharge Instructions: May shower and wash wound with dial antibacterial soap and water prior to dressing change. Cleanser: Wound Cleanser Every Other  Day/30 Days Discharge Instructions: Cleanse the wound with wound cleanser prior to applying a clean dressing using gauze sponges, not tissue or cotton balls. Peri-Wound Care: Sween Lotion (Moisturizing lotion) Every Other Day/30 Days Discharge Instructions: Apply Aquaphor moisturizing lotion as directed Topical: Mupirocin Ointment Every Other Day/30 Days Discharge Instructions: Apply Mupirocin (Bactroban) as instructed Prim Dressing: Maxorb Extra Ag+ Alginate Dressing, 2x2 (in/in) Every Other Day/30 Days ary Discharge Instructions: Apply to wound bed as instructed Secondary Dressing: Woven Gauze Sponge, Non-Sterile 4x4 in Every Other Day/30 Days Discharge Instructions: Apply over primary dressing as directed. Secured With: [employer], 4.5x3.1 (in/yd) (Generic) Every Other Day/30 Days Discharge Instructions: Secure with Kerlix as directed. Secured With: 18M Medipore H Soft Cloth Surgical T ape, 4 x 10 (in/yd) (Generic) Every Other Day/30 Days Discharge Instructions: Secure with tape as directed. WOUND #81: - Foot Wound Laterality: Plantar, Right Cleanser: Soap and Water Every Other Day/30 Days Discharge Instructions: May shower and wash wound with dial antibacterial soap and water prior to dressing change. Cleanser: Wound Cleanser Every Other Day/30 Days Discharge Instructions: Cleanse the wound with wound cleanser prior to applying a clean dressing using gauze sponges, not tissue or cotton balls. Peri-Wound Care: Sween Lotion (Moisturizing lotion) Every Other  Day/30 Days Discharge Instructions: Apply Aquaphor moisturizing lotion as directed Topical: Mupirocin Ointment Every Other Day/30 Days Discharge Instructions: Apply Mupirocin (Bactroban) as instructed Prim Dressing: Maxorb Extra Ag+ Alginate Dressing, 2x2 (in/in) Every Other Day/30 Days ary Discharge Instructions: Apply to wound bed as instructed Secondary Dressing: Woven Gauze Sponge, Non-Sterile 4x4 in Every Other Day/30  Days Discharge Instructions: Apply over primary dressing as directed. Secured With: [employer], 4.5x3.1 (in/yd) (Generic) Every Other Day/30 Days Discharge Instructions: Secure with Kerlix as directed. Secured With: 30M Medipore H Soft Cloth Surgical T ape, 4 x 10 (in/yd) (Generic) Every Other Day/30 Days Discharge Instructions: Secure with tape as directed. WOUND #82: - Lower Leg Wound Laterality: Left, Lateral, Proximal Cleanser: Soap and Water Every Other Day/30 Days Discharge Instructions: May shower and wash wound with dial antibacterial soap and water prior to dressing change. Cleanser: Wound Cleanser Every Other Day/30 Days Discharge Instructions: Cleanse the wound with wound cleanser prior to applying a clean dressing using gauze sponges, not tissue or cotton balls. Peri-Wound Care: Sween Lotion (Moisturizing lotion) Every Other Day/30 Days Discharge Instructions: Apply Aquaphor moisturizing lotion as directed Topical: Mupirocin Ointment Every Other Day/30 Days Discharge Instructions: Apply Mupirocin (Bactroban) as instructed Prim Dressing: Maxorb Extra Ag+ Alginate Dressing, 2x2 (in/in) Every Other Day/30 Days ary Rothbauer, Alcide E ([id number]) [id number] Page 51 of 40 Discharge Instructions: Apply to wound bed as instructed Secondary Dressing: Woven Gauze Sponge, Non-Sterile 4x4 in Every Other Day/30 Days Discharge Instructions: Apply over primary dressing as directed. Secured With: [employer], 4.5x3.1 (in/yd) (Generic) Every Other Day/30 Days Discharge Instructions: Secure with Kerlix as directed. Secured With: 30M Medipore H Soft Cloth Surgical T ape, 4 x 10 (in/yd) (Generic) Every Other Day/30 Days Discharge Instructions: Secure with tape as directed. 11/07/2023: Apparently the buttock area looks better today than it did at his previous visit. There is no necrosis present. It is still larger than when I saw it in November. The left  lateral ankle wound is larger and appears deeper under a thick layer of slough. Both feet have deteriorated and the wounds are larger with slough build up. The right anterior ankle wound is open again. I used a curette to debride slough and subcutaneous tissue from all of the open wound surfaces. In addition, as I suspected, the left lateral ankle wound has gotten deeper and I was actually debriding muscle from this wound, as well. We will continue the topical mupirocin and silver alginate to all sites. Follow-up in 2 weeks. Electronic Signature(s) Signed: 11/07/2023 5:10:17 PM By: Shawn Stall RN, BSN Signed: 11/08/2023 8:08:05 AM By: Duanne Guess MD FACS Previous Signature: 11/07/2023 12:43:55 PM Version By: Duanne Guess MD FACS Entered By: Shawn Stall on 11/07/2023 17:05:50 -------------------------------------------------------------------------------- SuperBill Details Patient Name: Date of Service: [place], [name] 11/07/2023 Medical Record Number: [id number] Patient Account Number: [id number] Date of Birth/Sex: Treating RN: 08/18/1986 (37 y.o. 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 Facility  Procedures : CPT4 Code: [id number] Description: 11042 - DEB SUBQ TISSUE 20 SQ CM/< ICD-10 Diagnosis Description L97.522 Non-pressure chronic  ulcer of other part of left foot with fat layer exposed L97.512 Non-pressure chronic ulcer of other part of right foot with fat layer exposed  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, stage 3 L97.822 Non-pressure chronic ulcer of other part of left lower leg with fat layer expose Modifier: d Quantity: 1 : CPT4 Code: [id number] Description: 11045 - DEB SUBQ TISS EA ADDL 20CM ICD-10 Diagnosis Description L97.822 Non-pressure chronic ulcer of other part of left lower leg with fat layer expo L97.312 Non-pressure chronic ulcer of right ankle with fat layer exposed L97.522  Non-pressure chronic ulcer of other part of left foot with fat layer exposed L97.512 Non-pressure chronic ulcer of other part of right foot with fat layer exposed Modifier: sed Quantity: 4 : [place] CPT4 Code: [id number] EX E ([id number]) L Description: 11043 - DEB MUSC/FASCIA 20 SQ CM/< ICD-10 Diagnosis Description [id number] 97.322 Non-pressure chronic ulcer of left ankle with fat layer exposed Modifier: [id number] Pa Quantity: 1 ge 40 of 40 Physician Procedures : CPT4 Code Description Modifier [id number] - WC PHYS LEVEL 4 - EST PT ICD-10 Diagnosis Description L97.522 Non-pressure chronic ulcer of other part of left foot with fat layer exposed L97.512 Non-pressure chronic ulcer of other part of right foot  with fat layer exposed 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, stage 3 L97.822 Non-pressure chronic ulcer of other part of left lower leg with fat layer exposed Quantity: 1 : [id number] 11042 - WC PHYS SUBQ TISS 20 SQ CM ICD-10 Diagnosis Description L97.522 Non-pressure chronic ulcer of other part of left foot with fat layer exposed L97.512 Non-pressure chronic ulcer of other part of right foot with fat layer exposed L89.323  Pressure ulcer of left buttock, stage 3 L97.822 Non-pressure chronic ulcer of other part of left lower leg with fat layer exposed Quantity: 1 : [id number] 11045 - WC PHYS SUBQ TISS EA ADDL 20  CM ICD-10 Diagnosis Description L97.822 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

## 2023-11-22 ENCOUNTER — Encounter (HOSPITAL_BASED_OUTPATIENT_CLINIC_OR_DEPARTMENT_OTHER): Payer: BC Managed Care – PPO | Admitting: General Surgery

## 2023-11-22 DIAGNOSIS — L97822 Non-pressure chronic ulcer of other part of left lower leg with fat layer exposed: Secondary | ICD-10-CM | POA: Diagnosis not present

## 2023-11-22 DIAGNOSIS — L97322 Non-pressure chronic ulcer of left ankle with fat layer exposed: Secondary | ICD-10-CM | POA: Diagnosis not present

## 2023-11-22 DIAGNOSIS — L89323 Pressure ulcer of left buttock, stage 3: Secondary | ICD-10-CM | POA: Diagnosis not present

## 2023-11-22 DIAGNOSIS — L03115 Cellulitis of right lower limb: Secondary | ICD-10-CM | POA: Diagnosis not present

## 2023-11-22 DIAGNOSIS — L97312 Non-pressure chronic ulcer of right ankle with fat layer exposed: Secondary | ICD-10-CM | POA: Diagnosis not present

## 2023-11-22 DIAGNOSIS — L299 Pruritus, unspecified: Secondary | ICD-10-CM | POA: Diagnosis not present

## 2023-11-22 DIAGNOSIS — L97422 Non-pressure chronic ulcer of left heel and midfoot with fat layer exposed: Secondary | ICD-10-CM | POA: Diagnosis not present

## 2023-11-22 DIAGNOSIS — Z792 Long term (current) use of antibiotics: Secondary | ICD-10-CM | POA: Diagnosis not present

## 2023-11-22 DIAGNOSIS — L97512 Non-pressure chronic ulcer of other part of right foot with fat layer exposed: Secondary | ICD-10-CM | POA: Diagnosis not present

## 2023-11-22 DIAGNOSIS — I872 Venous insufficiency (chronic) (peripheral): Secondary | ICD-10-CM | POA: Diagnosis not present

## 2023-11-22 DIAGNOSIS — G8221 Paraplegia, complete: Secondary | ICD-10-CM | POA: Diagnosis not present

## 2023-11-22 DIAGNOSIS — L89893 Pressure ulcer of other site, stage 3: Secondary | ICD-10-CM | POA: Diagnosis not present

## 2023-11-22 DIAGNOSIS — L97522 Non-pressure chronic ulcer of other part of left foot with fat layer exposed: Secondary | ICD-10-CM | POA: Diagnosis not present

## 2023-11-23 NOTE — Progress Notes (Signed)
 Renault, ALAM GUTERREZ ([id number]) [id number] Page 1 of 18 Visit Report for 11/22/2023 Arrival Information Details Patient Name: Date of Service: [name], [name] 11/22/2023 7:45 [name] Medical Record Number: [id number] Patient Account Number: [id number] Date of Birth/Sex: Treating [name]: 02/09/1986 (37 y.o. [name] Primary Care Nikisha Fleece: [name], [name] Other Clinician: Referring Myiah Petkus: Treating [name]/Extender: [name] [name]: 411 Visit Information History Since Last Visit Added or deleted any medications: No Patient Arrived: Wheel Chair Any new allergies or adverse reactions: No Arrival Time: 07:54 Had [name] fall or experienced change in No Accompanied By: self activities of daily living that may affect Transfer Assistance: Manual risk of falls: Patient Identification Verified: Yes Signs or symptoms of abuse/neglect since last visito No Patient Requires Transmission-Based Precautions: No Hospitalized since last visit: No Patient Has Alerts: Yes Implantable device outside of the clinic excluding No Patient Alerts: R ABI = 1.0 cellular tissue based products placed in the center L ABI = 1.1 since last visit: Has Dressing in Place as Prescribed: Yes Pain Present Now: No Electronic Signature(s) Signed: 11/22/2023 5:13:52 PM By: [name] Entered By: [name] on 11/22/2023 07:55:10 -------------------------------------------------------------------------------- Encounter Discharge Information Details Patient Name: Date of Service: [name], [name] 11/22/2023 7:45 [name] Medical Record Number: [id number] Patient Account Number: [id number] Date of Birth/Sex: Treating [name]: Mar 22, 1986 (37 y.o. [name] Primary Care [name]: [name], [name] Other Clinician: Referring [name]: Treating [name]/Extender: [name] [name]: [phone] Encounter Discharge Information Items Post  Procedure Vitals Discharge Condition: Stable Temperature (F): 98 Ambulatory Status: Ambulatory Pulse (bpm): 116 Discharge Destination: Home Respiratory Rate (breaths/min): 18 Transportation: Private Auto Blood Pressure (mmHg): 144/79 Accompanied By: self Schedule Follow-up Appointment: Yes Clinical Summary of Care: Patient Declined Electronic Signature(s) Signed: 11/22/2023 5:13:52 PM By: [name] Entered By: [name] on 11/22/2023 16:39:36 Haren, [name] ([id number]) [id number] Page 2 of 18 -------------------------------------------------------------------------------- Lower Extremity Assessment Details Patient Name: Date of Service: [name], [name] 11/22/2023 7:45 [name] Medical Record Number: [id number] Patient Account Number: [id number] Date of Birth/Sex: Treating [name]: 04/29/1986 (37 y.o. [name] Primary Care Oland Arquette: [name], [name] Other Clinician: Referring [name]: Treating [name]/Extender: [name] Delon [name], [name] [name]: 411 Edema Assessment Assessed: [Left: No] [Right: No] Edema: [Left: Yes] [Right: Yes] Calf Left: Right: Point of Measurement: 33 cm From Medial Instep 32.5 cm 35.2 cm Ankle Left: Right: Point of Measurement: 10 cm From Medial Instep 25.5 cm 24.9 cm Vascular Assessment Pulses: Dorsalis Pedis Palpable: [Left:Yes] [Right:Yes] Extremity colors, hair growth, and conditions: Extremity Color: [Left:Normal] [Right:Normal] Hair Growth on Extremity: [Left:Yes] [Right:Yes] Temperature of Extremity: [Left:Warm < 3 seconds] [Right:Warm < 3 seconds] Electronic Signature(s) Signed: 11/22/2023 5:13:52 PM By: [name] Entered By: [name] on 11/22/2023 08:04:58 -------------------------------------------------------------------------------- Multi Wound Chart Details Patient Name: Date of Service: [name], [name] 11/22/2023 7:45 [name] Medical Record Number:  [id number] Patient Account Number: [id number] Date of Birth/Sex: Treating [name]: 1986-07-11 (37 y.o. 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 Primary Etiology: Sleep Apnea, Hypertension, Paraplegia Sleep Apnea, Hypertension, Paraplegia Sleep  Apnea, Hypertension, Paraplegia Comorbid History: 03/27/2021 07/11/2021 06/22/2022 Date Acquired: 138 122 73 Weeks of Treatment: Open Open Open Wound Status: No No No Wound Recurrence: No Yes No Clustered Wound: [name] [name] [name] Clustered Quantity: 4.2x7.5x0.1 4.5x3.5x0.1 2x1x0.1 Measurements L x W x D (cm) 24.74 12.37 1.571 [name] (cm) : rea 2.474 1.237 0.157 Volume (cm) : -1212.50% -98.90% 72.00% % Reduction in [name] rea: -1216.00% -98.90% 72.00% % Reduction in Volume: Full Thickness Without Exposed Full Thickness Without Exposed Full Thickness Without Exposed Classification: Support Structures Support Structures Support Structures Large Large Medium Exudate [name] mount: Serous Serous Serous Exudate Type: psychologist, forensic Exudate Color: Distinct, outline attached Flat and Intact Distinct, outline attached Wound Margin: Medium (34-66%) Small (1-33%) Small (1-33%) Granulation [name] mount: Red, Pink Pink Red, Friable Granulation Quality: Medium (34-66%) Large (67-100%) Large (67-100%) Necrotic [name] mount: Adherent Slough Adherent [employer] Necrotic Tissue: Fat Layer (Subcutaneous  Tissue): Yes Fat Layer (Subcutaneous Tissue): Yes Fat Layer (Subcutaneous Tissue): Yes Exposed Structures: Fascia: No Fascia: No Fascia: No Tendon: No Tendon: No Tendon: No Muscle: No Muscle: No Muscle: No Joint: No Joint: No Joint: No Bone: No Bone: No Bone: No Small (1-33%) Small (1-33%) Small (1-33%) Epithelialization: Debridement - Selective/Open Wound Debridement - Excisional Debridement - Excisional Debridement: Pre-procedure Verification/Time Out 08:25 08:25 08:25 Taken: [name] Lidocaine  4% Topical Solution Lidocaine  4% Topical Solution Pain Control: [name] Subcutaneous, Slough Subcutaneous, Slough Tissue Debrided: Non-Viable Tissue Skin/Subcutaneous Tissue Skin/Subcutaneous Tissue Level: 24.73 12.36 1.57 Debridement [name] (sq cm): rea Curette Curette Curette Instrument: Minimum Minimum Minimum Bleeding: Pressure Pressure Pressure Hemostasis [name] chieved: Procedure was tolerated well Procedure was tolerated well Procedure was tolerated well Debridement Treatment Response: 4.2x7.5x0.1 4.5x3.5x0.1 2x1x0.1 Post Debridement Measurements L x W x D (cm) 2.474 1.237 0.157 Post Debridement Volume: (cm) [name] [name] [name] Post Debridement Stage: No Abnormalities Noted Excoriation: Yes Scarring: Yes Periwound Skin Texture: Maceration: Yes Maceration: Yes Dry/Scaly: Yes Periwound Skin Moisture: Dry/Scaly: No Dry/Scaly: No Rubor: No Rubor: Yes Erythema: No Periwound Skin Color: Erythema: No No Abnormality No Abnormality No Abnormality Temperature: Debridement Debridement Debridement Procedures Performed: Wound Number: [id number] Photos: Left, Posterior Upper Leg Right, Anterior Ankle Right, Plantar Foot Wound Location: Pressure Injury Gradually Appeared Gradually Appeared Wounding Event: Pressure Ulcer Venous Leg Ulcer Lymphedema Primary Etiology: Sleep Apnea, Hypertension, Paraplegia Sleep Apnea, Hypertension, Paraplegia Sleep Apnea, Hypertension, Paraplegia Comorbid  History: 01/10/2023 07/04/2023 09/23/2023 Date Acquired: 44 19 8 Weeks of Treatment: Open Open Open Wound Status: No No No Wound Recurrence: Yes No No Clustered Wound: 2 [name] [name] Clustered Quantity: 11.5x6.2x0.1 1.8x1.8x0.1 1.2x2.8x0.1 Measurements L x W x D (cm) 55.999 2.545 2.639 [name] (cm) : rea 5.6 0.254 0.264 Volume (cm) : Gallick, Shaya [name] ([id number]) [id number] Page 4 of 18 24.90% -40.90% -379.80% % Reduction in Area: 24.90% -40.30% -380.00% % Reduction in Volume: Category/Stage III Full Thickness Without Exposed Full Thickness Without Exposed Classification: Support Structures Support Structures Medium Small Medium Exudate [name] mount: Serosanguineous Serosanguineous Serous Exudate Type: red, brown red, brown amber Exudate Color: Flat and Intact Flat and Intact Flat and Intact Wound Margin: Medium (34-66%) Large (67-100%) Small (1-33%) Granulation [name] mount: Red, Friable Red, Friable Pink Granulation Quality: Medium (34-66%) Small (1-33%) Large (67-100%) Necrotic [name] mount: Adherent Slough Adherent [employer] Necrotic Tissue: Fat Layer (Subcutaneous Tissue): Yes Fat Layer (Subcutaneous Tissue): Yes Fat Layer (Subcutaneous Tissue): Yes Exposed Structures: Fascia: No Fascia: No Fascia: No Tendon: No Tendon: No Tendon: No Muscle: No Muscle: No Muscle: No Joint: No Joint:  No Joint: No Bone: No Bone: No Bone: No Medium (34-66%) Medium (34-66%) Medium (34-66%) Epithelialization: Debridement - Excisional Debridement - Excisional Debridement - Excisional Debridement: Pre-procedure Verification/Time Out 08:25 08:25 08:25 Taken: Lidocaine  4% Topical Solution Lidocaine  4% Topical Solution Lidocaine  4% Topical Solution Pain Control: Subcutaneous, Slough Subcutaneous, Slough Subcutaneous, Slough Tissue Debrided: Skin/Subcutaneous Tissue Skin/Subcutaneous Tissue Skin/Subcutaneous Tissue Level: 55.97 2.54 2.64 Debridement [name] (sq  cm): rea Curette Curette Curette Instrument: Minimum Minimum Minimum Bleeding: Pressure Pressure Pressure Hemostasis [name] chieved: Procedure was tolerated well Procedure was tolerated well Procedure was tolerated well Debridement Treatment Response: 11.5x6.2x0.1 1.8x1.8x0.1 1.2x2.8x0.1 Post Debridement Measurements L x W x D (cm) 5.6 0.254 0.264 Post Debridement Volume: (cm) Category/Stage III [name] [name] Post Debridement Stage: Scarring: Yes Scarring: Yes No Abnormalities Noted Periwound Skin Texture: Dry/Scaly: Yes No Abnormalities Noted Maceration: Yes Periwound Skin Moisture: Ecchymosis: Yes No Abnormalities Noted No Abnormalities Noted Periwound Skin Color: No Abnormality No Abnormality No Abnormality Temperature: Debridement Debridement Debridement Procedures Performed: Wound Number: 29 [name] [name] Photos: [name] [name] Left, Proximal, Lateral Lower Leg [name] [name] Wound Location: Pressure Injury [name] [name] Wounding Event: Pressure Ulcer [name] [name] Primary Etiology: Sleep Apnea, Hypertension, Paraplegia [name] [name] Comorbid History: 11/04/2023 [name] [name] Date Acquired: 2 [name] [name] Weeks of Treatment: Open [name] [name] Wound Status: No [name] [name] Wound Recurrence: No [name] [name] Clustered Wound: [name] [name] [name] Clustered Quantity: 1.4x2.5x0.1 [name] [name] Measurements L x W x D (cm) 2.749 [name] [name] [name] (cm) : rea 0.275 [name] [name] Volume (cm) : 7.40% [name] [name] % Reduction in [name] rea: 7.40% [name] [name] % Reduction in Volume: Unstageable/Unclassified [name] [name] Classification: Medium [name] [name] Exudate [name] mount: Serosanguineous [name] [name] Exudate Type: red, brown [name] [name] Exudate Color: Indistinct, nonvisible [name] [name] Wound Margin: Small (1-33%) [name] [name] Granulation [name] mount: Pink [name] [name] Granulation Quality: Large (67-100%) [name] [name] Necrotic [name] mount: Eschar, Adherent Slough [name] [name] Necrotic Tissue: Fat Layer (Subcutaneous Tissue): Yes [name] [name] Exposed Structures: Fascia: No Tendon: No Muscle: No Joint: No Bone:  No Small (1-33%) [name] [name] Epithelialization: Debridement - Excisional [name] [name] Debridement: Pre-procedure Verification/Time Out 08:25 [name] [name] Taken: YUSSEF, JORGE ([id number]) [id number] Page 5 of 18 Lidocaine  4% Topical Solution [name] [name] Pain Control: Subcutaneous, Slough [name] [name] Tissue Debrided: Skin/Subcutaneous Tissue [name] [name] Level: 2.75 [name] [name] Debridement [name] (sq cm): rea Curette [name] [name] Instrument: Minimum [name] [name] Bleeding: Pressure [name] [name] Hemostasis [name] chieved: Procedure was tolerated well [name] [name] Debridement Treatment Response: 1.4x2.5x0.1 [name] [name] Post Debridement Measurements L x W x D (cm) 0.275 [name] [name] Post Debridement Volume: (cm) Unstageable/Unclassified [name] [name] Post Debridement Stage: No Abnormalities Noted [name] [name] Periwound Skin Texture: No Abnormalities Noted [name] [name] Periwound Skin Moisture: No Abnormalities Noted [name] [name] Periwound Skin Color: No Abnormality [name] [name] Temperature: Debridement [name] [name] Procedures Performed: Treatment Notes Electronic Signature(s) Signed: 11/22/2023 10:09:16 AM By: [name] Nest MD FACS Entered By: [name] Nest on 11/22/2023 09:01:52 -------------------------------------------------------------------------------- Multi-Disciplinary Care Plan Details Patient Name: Date of Service: [name], [name] 11/22/2023 7:45 [name] Medical Record Number: [id number] Patient Account Number: [id number] Date of Birth/Sex: Treating [name]: 1986-03-30 (37 y.o. [name] Primary Care Gardy Montanari: [name], [name] Other Clinician: Referring Nashley Cordoba: Treating Raevin Wierenga/Extender: [name] Nest [name] [name]: [phone] Multidisciplinary Care Plan reviewed with physician Active Inactive Pressure Nursing Diagnoses: Knowledge deficit related to management of pressures ulcers Potential for impaired tissue integrity related to pressure, friction, moisture, and shear Goals: Patient will remain free  from development of  additional pressure ulcers Date Initiated: 06/21/2018 Date Inactivated: 02/12/2019 Target Resolution Date: 02/16/2019 Unmet Reason: pressure ulcer Goal Status: Unmet reopened on left ischium Patient/caregiver will verbalize risk factors for pressure ulcer development Date Initiated: 06/21/2018 Date Inactivated: 05/28/2019 Target Resolution Date: 06/01/2019 Goal Status: Met Patient/caregiver will verbalize understanding of pressure ulcer management Date Initiated: 06/01/2022 Target Resolution Date: 05/19/2024 Goal Status: Active Interventions: Assess: immobility, friction, shearing, incontinence upon admission and as needed Assess offloading mechanisms upon admission and as needed Assess potential for pressure ulcer upon admission and as needed Treatment Activities: Pressure reduction/relief device ordered : 12/05/2017 Notes: Venous Leg Ulcer Soliday, Pranay [name] ([id number]) [id number] Page 6 of 18 Nursing Diagnoses: Knowledge deficit related to disease process and management Potential for venous Insuffiency (use before diagnosis confirmed) Goals: Non-invasive venous studies are completed as ordered Date Initiated: 01/05/2016 Date Inactivated: 09/15/2017 Target Resolution Date: 01/20/2017 Goal Status: Met Patient will maintain optimal edema control Date Initiated: 01/05/2016 Target Resolution Date: 05/19/2024 Goal Status: Active Patient/caregiver will verbalize understanding of disease process and disease management Date Initiated: 01/05/2016 Date Inactivated: 09/15/2017 Target Resolution Date: 01/20/2017 Goal Status: Met Verify adequate tissue perfusion prior to therapeutic compression application Date Initiated: 01/05/2016 Date Inactivated: 09/15/2017 Target Resolution Date: 01/20/2017 Goal Status: Met Interventions: Assess peripheral edema status every visit. Compression as ordered Provide education on venous insufficiency Treatment  Activities: Non-invasive vascular studies : 03/11/2016 Therapeutic compression applied : 03/11/2016 Notes: Wound/Skin Impairment Nursing Diagnoses: Impaired tissue integrity Knowledge deficit related to ulceration/compromised skin integrity Goals: Patient/caregiver will verbalize understanding of skin care regimen Date Initiated: 06/01/2022 Target Resolution Date: 05/19/2024 Goal Status: Active Ulcer/skin breakdown will have [name] volume reduction of 30% by week 4 Date Initiated: 06/01/2022 Date Inactivated: 06/29/2022 Target Resolution Date: 06/29/2022 Unmet Reason: complex wounds, Goal Status: Unmet infection Interventions: Assess patient/caregiver ability to obtain necessary supplies Assess ulceration(s) every visit Provide education on ulcer and skin care Notes: 02/02/21: Complex Care, ongoing. Electronic Signature(s) Signed: 11/22/2023 5:13:52 PM By: [name] Entered By: [name] on 11/22/2023 08:45:45 -------------------------------------------------------------------------------- Pain Assessment Details Patient Name: Date of Service: [name], [name] 11/22/2023 7:45 [name] Medical Record Number: [id number] Patient Account Number: [id number] Date of Birth/Sex: Treating [name]: 14-May-1986 (37 y.o. [name] Primary Care Kae Lauman: [name], [name] Other Clinician: Referring Cricket Goodlin: Treating Earvin Blazier/Extender: [name] [name]: [phone] Active Problems Location of Pain Severity and Description of Pain Reiswig, Nissim [name] ([id number]) [id number] Page 7 of 18 Patient Has Paino No Site Locations Pain Management and Medication Current Pain Management: Electronic Signature(s) Signed: 11/22/2023 5:13:52 PM By: [name] Entered By: [name] on 11/22/2023 07:55:25 -------------------------------------------------------------------------------- Patient/Caregiver Education Details Patient Name: Date of  Service: Anwar, DELENA MARVEL DRAFTS 12/31/2024andnbsp7:45 [name] Medical Record Number: [id number] Patient Account Number: [id number] Date of Birth/Gender: Treating [name]: May 15, 1986 (37 y.o. [name] Primary Care Physician: [name] Other Clinician: Referring Physician: Treating Physician/Extender: [name] [name]: [phone] Education Assessment Education Provided To: Patient Education Topics Provided Wound/Skin Impairment: Methods: Explain/Verbal Responses: State content correctly Electronic Signature(s) Signed: 11/22/2023 5:13:52 PM By: [name] Entered By: [name] on 11/22/2023 09:12:29 -------------------------------------------------------------------------------- Wound Assessment Details Patient Name: Date of Service: [name], [name] 11/22/2023 7:45 [name] Medical Record Number: [id number] Patient Account Number: [id number] Date of Birth/Sex: Treating [name]: 11-Feb-1986 (37 y.o. [name]) Scotton, Joanne Kruk, Micajah [name] ([id number]) [id number] Page 8 of 18 Primary Care Bj Morlock: [name], [name] Other Clinician: Referring Adaya Garmany: Treating Kamdyn Colborn/Extender: [name] Delon [name], [name]  [name]: 411 Wound Status Wound Number: 52 Primary Etiology: Venous Leg Ulcer Wound Location: Right, Dorsal Foot Wound Status: Open Wounding Event: Gradually Appeared Comorbid History: Sleep Apnea, Hypertension, Paraplegia Date Acquired: 03/27/2021 Weeks Of Treatment: 138 Clustered Wound: No Photos Wound Measurements Length: (cm) 4.2 Width: (cm) 7.5 Depth: (cm) 0.1 Area: (cm) 24.74 Volume: (cm) 2.474 % Reduction in Area: -1212.5% % Reduction in Volume: -1216% Epithelialization: Small (1-33%) Tunneling: No Undermining: No Wound Description Classification: Full Thickness Without Exposed Support Structures Wound Margin: Distinct, outline attached Exudate Amount: Large Exudate Type: Serous Exudate Color:  amber Foul Odor After Cleansing: No Slough/Fibrino Yes Wound Bed Granulation Amount: Medium (34-66%) Exposed Structure Granulation Quality: Red, Pink Fascia Exposed: No Necrotic Amount: Medium (34-66%) Fat Layer (Subcutaneous Tissue) Exposed: Yes Necrotic Quality: Adherent Slough Tendon Exposed: No Muscle Exposed: No Joint Exposed: No Bone Exposed: No Periwound Skin Texture Texture Color No Abnormalities Noted: Yes No Abnormalities Noted: Yes Moisture Temperature / Pain No Abnormalities Noted: No Temperature: No Abnormality Dry / Scaly: No Maceration: Yes Treatment Notes Wound #52 (Foot) Wound Laterality: Dorsal, Right Cleanser Soap and Water Discharge Instruction: May shower and wash wound with dial antibacterial soap and water prior to dressing change. Wound Cleanser Discharge Instruction: Cleanse the wound with wound cleanser prior to applying [name] clean dressing using gauze sponges, not tissue or cotton balls. Peri-Wound Care Sween Lotion (Moisturizing lotion) Discharge Instruction: Apply Aquaphor moisturizing lotion as directed Topical Alton, Vada [name] ([id number]) [id number] Page 9 of 18 Mupirocin  Ointment Discharge Instruction: Apply Mupirocin  (Bactroban ) as instructed Primary Dressing Maxorb Extra Ag+ Alginate Dressing, 2x2 (in/in) Discharge Instruction: Apply to wound bed as instructed Secondary Dressing Woven Gauze Sponge, Non-Sterile 4x4 in Discharge Instruction: Apply over primary dressing as directed. Secured With [employer], 4.5x3.1 (in/yd) Discharge Instruction: Secure with Kerlix as directed. 26M Medipore H Soft Cloth Surgical T ape, 4 x 10 (in/yd) Discharge Instruction: Secure with tape as directed. Compression Wrap Compression Stockings Add-Ons Electronic Signature(s) Signed: 11/22/2023 5:13:52 PM By: [name] Entered By: [name] on 11/22/2023  08:19:26 -------------------------------------------------------------------------------- Wound Assessment Details Patient Name: Date of Service: [name], [name] 11/22/2023 7:45 [name] Medical Record Number: [id number] Patient Account Number: [id number] Date of Birth/Sex: Treating [name]: [birth year] (37 y.o. [name] Primary Care Hodari Chuba: [name], [name] Other Clinician: Referring Allison Silva: Treating Rayna Brenner/Extender: [name] Delon BRIDGE, [name] [name]: 411 Wound Status Wound Number: 56 Primary Etiology: Neuropathic Ulcer-Non Diabetic Wound Location: Left, Dorsal Foot Wound Status: Open Wounding Event: Gradually Appeared Comorbid History: Sleep Apnea, Hypertension, Paraplegia Date Acquired: 07/11/2021 Weeks Of Treatment: 122 Clustered Wound: Yes Photos Wound Measurements Length: (cm) 4.5 Width: (cm) 3.5 Depth: (cm) 0.1 Area: (cm) 12.37 Volume: (cm) 1.237 % Reduction in Area: -98.9% % Reduction in Volume: -98.9% Epithelialization: Small (1-33%) Tunneling: No Undermining: No Wound Description Buckner, Dehaven [name] ([id number]) Classification: Full Thickness Without Exposed Support Structures Wound Margin: Flat and Intact Exudate Amount: Large Exudate Type: Serous Exudate Color: amber [id number] Page 10 of 18 Foul Odor After Cleansing: No Slough/Fibrino Yes Wound Bed Granulation Amount: Small (1-33%) Exposed Structure Granulation Quality: Pink Fascia Exposed: No Necrotic Amount: Large (67-100%) Fat Layer (Subcutaneous Tissue) Exposed: Yes Necrotic Quality: Adherent Slough Tendon Exposed: No Muscle Exposed: No Joint Exposed: No Bone Exposed: No Periwound Skin Texture Texture Color No Abnormalities Noted: No No Abnormalities Noted: No Excoriation: Yes Erythema: No Rubor: Yes Moisture No Abnormalities Noted: No Temperature / Pain Dry / Scaly: No Temperature: No Abnormality Maceration: Yes Treatment Notes  Wound #56 (Foot)  Wound Laterality: Dorsal, Left Cleanser Soap and Water Discharge Instruction: May shower and wash wound with dial antibacterial soap and water prior to dressing change. Wound Cleanser Discharge Instruction: Cleanse the wound with wound cleanser prior to applying [name] clean dressing using gauze sponges, not tissue or cotton balls. Peri-Wound Care Sween Lotion (Moisturizing lotion) Discharge Instruction: Apply Aquaphor moisturizing lotion as directed Topical Mupirocin  Ointment Discharge Instruction: Apply Mupirocin  (Bactroban ) as instructed Primary Dressing Maxorb Extra Ag+ Alginate Dressing, 2x2 (in/in) Discharge Instruction: Apply to wound bed as instructed Secondary Dressing Woven Gauze Sponge, Non-Sterile 4x4 in Discharge Instruction: Apply over primary dressing as directed. Secured With [employer], 4.5x3.1 (in/yd) Discharge Instruction: Secure with Kerlix as directed. 624M Medipore H Soft Cloth Surgical T ape, 4 x 10 (in/yd) Discharge Instruction: Secure with tape as directed. Compression Wrap Compression Stockings Add-Ons Electronic Signature(s) Signed: 11/22/2023 5:13:52 PM By: [name] Entered By: Scotton, Joanne on 11/22/2023 08:20:22 Duguay, Ayden [name] ([id number]) [id number] Page 11 of 18 -------------------------------------------------------------------------------- Wound Assessment Details Patient Name: Date of Service: [name], [name] 11/22/2023 7:45 [name] Medical Record Number: [id number] Patient Account Number: [id number] Date of Birth/Sex: Treating [name]: 1985-12-28 (37 y.o. [name] Primary Care Quamir Willemsen: [name], [name] Other Clinician: Referring Elinor Kleine: Treating Abbigale Mcelhaney/Extender: [name] Delon BRIDGE, [name] [name]: 411 Wound Status Wound Number: 67 Primary Etiology: Venous Leg Ulcer Wound Location: Left, Lateral Lower Leg Wound Status: Open Wounding Event: Gradually Appeared Comorbid History:  Sleep Apnea, Hypertension, Paraplegia Date Acquired: 06/22/2022 Weeks Of Treatment: 73 Clustered Wound: No Photos Wound Measurements Length: (cm) 2 Width: (cm) 1 Depth: (cm) 0.1 Area: (cm) 1.571 Volume: (cm) 0.157 % Reduction in Area: 72% % Reduction in Volume: 72% Epithelialization: Small (1-33%) Tunneling: No Undermining: No Wound Description Classification: Full Thickness Without Exposed Support Structures Wound Margin: Distinct, outline attached Exudate Amount: Medium Exudate Type: Serous Exudate Color: amber Foul Odor After Cleansing: No Slough/Fibrino No Wound Bed Granulation Amount: Small (1-33%) Exposed Structure Granulation Quality: Red, Friable Fascia Exposed: No Necrotic Amount: Large (67-100%) Fat Layer (Subcutaneous Tissue) Exposed: Yes Necrotic Quality: Adherent Slough Tendon Exposed: No Muscle Exposed: No Joint Exposed: No Bone Exposed: No Periwound Skin Texture Texture Color No Abnormalities Noted: No No Abnormalities Noted: Yes Scarring: Yes Temperature / Pain Temperature: No Abnormality Moisture No Abnormalities Noted: No Dry / Scaly: Yes Treatment Notes Wound #67 (Lower Leg) Wound Laterality: Left, Lateral Cleanser Soap and Water Discharge Instruction: May shower and wash wound with dial antibacterial soap and water prior to dressing change. Wound Cleanser Discharge Instruction: Cleanse the wound with wound cleanser prior to applying [name] clean dressing using gauze sponges, not tissue or cotton balls. Nwosu, RONIEL HALLORAN ([id number]) [id number] Page 12 of 18 Peri-Wound Care Sween Lotion (Moisturizing lotion) Discharge Instruction: Apply Aquaphor moisturizing lotion as directed Topical Mupirocin  Ointment Discharge Instruction: Apply Mupirocin  (Bactroban ) as instructed Primary Dressing Maxorb Extra Ag+ Alginate Dressing, 2x2 (in/in) Discharge Instruction: Apply to wound bed as instructed Secondary Dressing Woven Gauze  Sponge, Non-Sterile 4x4 in Discharge Instruction: Apply over primary dressing as directed. Secured With [employer], 4.5x3.1 (in/yd) Discharge Instruction: Secure with Kerlix as directed. 624M Medipore H Soft Cloth Surgical T ape, 4 x 10 (in/yd) Discharge Instruction: Secure with tape as directed. Compression Wrap Compression Stockings Add-Ons Electronic Signature(s) Signed: 11/22/2023 5:13:52 PM By: [name] Entered By: Scotton, Joanne on 11/22/2023 08:21:33 -------------------------------------------------------------------------------- Wound Assessment Details Patient Name: Date of Service: [name], [name] 11/22/2023 7:45 [name] Medical  Record Number: [id number] Patient Account Number: [id number] Date of Birth/Sex: Treating [name]: 02/01/1986 (37 y.o. [name] Primary Care Jomarion Mish: [name], [name] Other Clinician: Referring El Pile: Treating Roniel Halloran/Extender: [name] Delon BRIDGE, [name] [name]: 411 Wound Status Wound Number: 74 Primary Etiology: Pressure Ulcer Wound Location: Left, Posterior Upper Leg Wound Status: Open Wounding Event: Pressure Injury Comorbid History: Sleep Apnea, Hypertension, Paraplegia Date Acquired: 01/10/2023 Weeks Of Treatment: 44 Clustered Wound: Yes Photos Wound Measurements Length: (cm) 11.5 Width: (cm) 6.2 Emberton, Fredi [name] ([id number]) Depth: (cm) 0.1 Clustered Quantity: 2 Area: (cm) 55.999 Volume: (cm) 5.6 % Reduction in Area: 24.9% % Reduction in Volume: 24.9% [id number] Page 13 of 18 Epithelialization: Medium (34-66%) Tunneling: No Undermining: No Wound Description Classification: Category/Stage III Wound Margin: Flat and Intact Exudate Amount: Medium Exudate Type: Serosanguineous Exudate Color: red, brown Foul Odor After Cleansing: No Slough/Fibrino No Wound Bed Granulation Amount: Medium (34-66%) Exposed Structure Granulation Quality: Red, Friable Fascia Exposed:  No Necrotic Amount: Medium (34-66%) Fat Layer (Subcutaneous Tissue) Exposed: Yes Necrotic Quality: Adherent Slough Tendon Exposed: No Muscle Exposed: No Joint Exposed: No Bone Exposed: No Periwound Skin Texture Texture Color No Abnormalities Noted: No No Abnormalities Noted: Yes Scarring: Yes Temperature / Pain Temperature: No Abnormality Moisture No Abnormalities Noted: Yes Treatment Notes Wound #74 (Upper Leg) Wound Laterality: Left, Posterior Cleanser Vashe 5.8 (oz) Discharge Instruction: Cleanse the wound with Vashe prior to applying [name] clean dressing using gauze sponges , let sit on wound for 10 minutes Peri-Wound Care Zinc Oxide Ointment 30g tube Discharge Instruction: Apply Zinc Oxide to periwound with each dressing change as needed fpr moisture Topical Mupirocin  Ointment Discharge Instruction: Apply Mupirocin  (Bactroban ) as instructed [employer] antibiotic compound Primary Dressing Maxorb Extra Ag+ Alginate Dressing, 4x4.75 (in/in) Discharge Instruction: Apply to wound bed as instructed Secondary Dressing Zetuvit Plus Silicone Border Sacrum Dressing, Sm, 7x7 (in/in) Discharge Instruction: Apply silicone border over primary dressing as directed. Secured With 67M Medipore H Soft Cloth Surgical T ape, 4 x 10 (in/yd) Discharge Instruction: Secure with tape as directed. Compression Wrap Compression Stockings Add-Ons Electronic Signature(s) Signed: 11/22/2023 5:13:52 PM By: [name] Entered By: [name] on 11/22/2023 08:22:29 Leitch, Demerius [name] ([id number]) [id number] Page 14 of 18 -------------------------------------------------------------------------------- Wound Assessment Details Patient Name: Date of Service: [name], [name] 11/22/2023 7:45 [name] Medical Record Number: [id number] Patient Account Number: [id number] Date of Birth/Sex: Treating [name]: 1986/03/24 (37 y.o. [name] Primary Care Madysin Crisp: [name], [name] Other  Clinician: Referring Davianna Deutschman: Treating Malyia Moro/Extender: [name] Delon BRIDGE, [name] [name]: 411 Wound Status Wound Number: 78 Primary Etiology: Venous Leg Ulcer Wound Location: Right, Anterior Ankle Wound Status: Open Wounding Event: Gradually Appeared Comorbid History: Sleep Apnea, Hypertension, Paraplegia Date Acquired: 07/04/2023 Weeks Of Treatment: 19 Clustered Wound: No Photos Wound Measurements Length: (cm) 1 Width: (cm) 1 Depth: (cm) 0 Area: (cm) Volume: (cm) .8 % Reduction in Area: -40.9% .8 % Reduction in Volume: -40.3% .1 Epithelialization: Medium (34-66%) 2.545 Tunneling: No 0.254 Undermining: No Wound Description Classification: Full Thickness Without Exposed Suppor Wound Margin: Flat and Intact Exudate Amount: Small Exudate Type: Serosanguineous Exudate Color: red, brown t Structures Foul Odor After Cleansing: No Slough/Fibrino No Wound Bed Granulation Amount: Large (67-100%) Exposed Structure Granulation Quality: Red, Friable Fascia Exposed: No Necrotic Amount: Small (1-33%) Fat Layer (Subcutaneous Tissue) Exposed: Yes Necrotic Quality: Adherent Slough Tendon Exposed: No Muscle Exposed: No Joint Exposed: No Bone Exposed: No Periwound Skin Texture Texture Color No Abnormalities Noted: No No Abnormalities Noted: Yes Scarring: Yes  Temperature / Pain Temperature: No Abnormality Moisture No Abnormalities Noted: Yes Treatment Notes Wound #78 (Ankle) Wound Laterality: Right, Anterior Cleanser Soap and Water Popper, Hal [name] ([id number]) [id number] Page 15 of 18 Discharge Instruction: May shower and wash wound with dial antibacterial soap and water prior to dressing change. Wound Cleanser Discharge Instruction: Cleanse the wound with wound cleanser prior to applying [name] clean dressing using gauze sponges, not tissue or cotton balls. Peri-Wound Care Sween Lotion (Moisturizing lotion) Discharge Instruction:  Apply Aquaphor moisturizing lotion as directed Topical Mupirocin  Ointment Discharge Instruction: Apply Mupirocin  (Bactroban ) as instructed Primary Dressing Maxorb Extra Ag+ Alginate Dressing, 2x2 (in/in) Discharge Instruction: Apply to wound bed as instructed Secondary Dressing Woven Gauze Sponge, Non-Sterile 4x4 in Discharge Instruction: Apply over primary dressing as directed. Secured With [employer], 4.5x3.1 (in/yd) Discharge Instruction: Secure with Kerlix as directed. 57M Medipore H Soft Cloth Surgical T ape, 4 x 10 (in/yd) Discharge Instruction: Secure with tape as directed. Compression Wrap Compression Stockings Add-Ons Electronic Signature(s) Signed: 11/22/2023 5:13:52 PM By: [name] Entered By: [name] on 11/22/2023 08:23:20 -------------------------------------------------------------------------------- Wound Assessment Details Patient Name: Date of Service: [name], [name] 11/22/2023 7:45 [name] Medical Record Number: [id number] Patient Account Number: [id number] Date of Birth/Sex: Treating [name]: 14-Nov-1986 (37 y.o. [name] Primary Care Rainey Rodger: [name], [name] Other Clinician: Referring Zoee Heeney: Treating Evetta Renner/Extender: [name] Delon BRIDGE, [name] [name]: 411 Wound Status Wound Number: 81 Primary Etiology: Lymphedema Wound Location: Right, Plantar Foot Wound Status: Open Wounding Event: Gradually Appeared Comorbid History: Sleep Apnea, Hypertension, Paraplegia Date Acquired: 09/23/2023 Weeks Of Treatment: 8 Clustered Wound: No Photos Gentz, Bryse [name] ([id number]) [id number] Page 16 of 18 Wound Measurements Length: (cm) 1.2 Width: (cm) 2.8 Depth: (cm) 0.1 Area: (cm) 2.639 Volume: (cm) 0.264 % Reduction in Area: -379.8% % Reduction in Volume: -380% Epithelialization: Medium (34-66%) Tunneling: No Undermining: No Wound Description Classification: Full Thickness Without Exposed  Support Structures Wound Margin: Flat and Intact Exudate Amount: Medium Exudate Type: Serous Exudate Color: amber Foul Odor After Cleansing: No Slough/Fibrino Yes Wound Bed Granulation Amount: Small (1-33%) Exposed Structure Granulation Quality: Pink Fascia Exposed: No Necrotic Amount: Large (67-100%) Fat Layer (Subcutaneous Tissue) Exposed: Yes Necrotic Quality: Adherent Slough Tendon Exposed: No Muscle Exposed: No Joint Exposed: No Bone Exposed: No Periwound Skin Texture Texture Color No Abnormalities Noted: Yes No Abnormalities Noted: Yes Moisture Temperature / Pain No Abnormalities Noted: No Temperature: No Abnormality Maceration: Yes Treatment Notes Wound #81 (Foot) Wound Laterality: Plantar, Right Cleanser Soap and Water Discharge Instruction: May shower and wash wound with dial antibacterial soap and water prior to dressing change. Wound Cleanser Discharge Instruction: Cleanse the wound with wound cleanser prior to applying [name] clean dressing using gauze sponges, not tissue or cotton balls. Peri-Wound Care Sween Lotion (Moisturizing lotion) Discharge Instruction: Apply Aquaphor moisturizing lotion as directed Topical Mupirocin  Ointment Discharge Instruction: Apply Mupirocin  (Bactroban ) as instructed Primary Dressing Maxorb Extra Ag+ Alginate Dressing, 2x2 (in/in) Discharge Instruction: Apply to wound bed as instructed Secondary Dressing Woven Gauze Sponge, Non-Sterile 4x4 in Discharge Instruction: Apply over primary dressing as directed. Secured With [employer], 4.5x3.1 (in/yd) Discharge Instruction: Secure with Kerlix as directed. 57M Medipore H Soft Cloth Surgical T ape, 4 x 10 (in/yd) Discharge Instruction: Secure with tape as directed. Compression Wrap Compression Stockings Add-Ons Electronic Signature(s) Signed: 11/22/2023 5:13:52 PM By: [name] Entered By: [name] on 11/22/2023 08:24:12 Pauley, Korrey [name] ([id number])  [id number] Page 17 of 18 -------------------------------------------------------------------------------- Wound Assessment Details Patient  Name: Date of Service: Hoh, [name] 11/22/2023 7:45 [name] Medical Record Number: [id number] Patient Account Number: [id number] Date of Birth/Sex: Treating [name]: 01-01-1986 (37 y.o. [name] Primary Care Deetta Siegmann: [name], [name] Other Clinician: Referring Clayton Jarmon: Treating Cadarius Nevares/Extender: [name] Delon BRIDGE, [name] [name]: 411 Wound Status Wound Number: 82 Primary Etiology: Pressure Ulcer Wound Location: Left, Proximal, Lateral Lower Leg Wound Status: Open Wounding Event: Pressure Injury Comorbid History: Sleep Apnea, Hypertension, Paraplegia Date Acquired: 11/04/2023 Weeks Of Treatment: 2 Clustered Wound: No Photos Wound Measurements Length: (cm) 1 Width: (cm) 2 Depth: (cm) 0 Area: (cm) Volume: (cm) .4 % Reduction in Area: 7.4% .5 % Reduction in Volume: 7.4% .1 Epithelialization: Small (1-33%) 2.749 Tunneling: No 0.275 Undermining: No Wound Description Classification: Unstageable/Unclassified Wound Margin: Indistinct, nonvisible Exudate Amount: Medium Exudate Type: Serosanguineous Exudate Color: red, brown Foul Odor After Cleansing: No Slough/Fibrino Yes Wound Bed Granulation Amount: Small (1-33%) Exposed Structure Granulation Quality: Pink Fascia Exposed: No Necrotic Amount: Large (67-100%) Fat Layer (Subcutaneous Tissue) Exposed: Yes Necrotic Quality: Eschar, Adherent Slough Tendon Exposed: No Muscle Exposed: No Joint Exposed: No Bone Exposed: No Periwound Skin Texture Texture Color No Abnormalities Noted: Yes No Abnormalities Noted: Yes Moisture Temperature / Pain No Abnormalities Noted: Yes Temperature: No Abnormality Treatment Notes Wound #82 (Lower Leg) Wound Laterality: Left, Lateral, Proximal Cleanser Autry, Messi [name] ([id number])  [id number] Page 18 of 18 Soap and Water Discharge Instruction: May shower and wash wound with dial antibacterial soap and water prior to dressing change. Wound Cleanser Discharge Instruction: Cleanse the wound with wound cleanser prior to applying [name] clean dressing using gauze sponges, not tissue or cotton balls. Peri-Wound Care Sween Lotion (Moisturizing lotion) Discharge Instruction: Apply Aquaphor moisturizing lotion as directed Topical Mupirocin  Ointment Discharge Instruction: Apply Mupirocin  (Bactroban ) as instructed Primary Dressing Maxorb Extra Ag+ Alginate Dressing, 2x2 (in/in) Discharge Instruction: Apply to wound bed as instructed Secondary Dressing Woven Gauze Sponge, Non-Sterile 4x4 in Discharge Instruction: Apply over primary dressing as directed. Secured With [employer], 4.5x3.1 (in/yd) Discharge Instruction: Secure with Kerlix as directed. 76M Medipore H Soft Cloth Surgical T ape, 4 x 10 (in/yd) Discharge Instruction: Secure with tape as directed. Compression Wrap Compression Stockings Add-Ons Electronic Signature(s) Signed: 11/22/2023 5:13:52 PM By: [name] Entered By: Scotton, Joanne on 11/22/2023 08:25:03 -------------------------------------------------------------------------------- Vitals Details Patient Name: Date of Service: [place]audoin, [name] 11/22/2023 7:45 [name] Medical Record Number: [id number] Patient Account Number: [id number] Date of Birth/Sex: Treating [name]: 13-Dec-1985 (37 y.o. [name] Primary Care Keshonda Monsour: [name], [name] Other Clinician: Referring Orva Gwaltney: Treating Taje Tondreau/Extender: [name] Delon BRIDGE, [name] [name]: 411 Vital Signs Time Taken: 07:54 Temperature (F): 98 Height (in): 70 Pulse (bpm): 116 Weight (lbs): 216 Respiratory Rate (breaths/min): 18 Body Mass Index (BMI): 31 Blood Pressure (mmHg): 144/79 Reference Range: 80 - 120 mg / dl Electronic  Signature(s) Signed: 11/22/2023 5:13:52 PM By: [name] Entered By: [name] on 11/22/2023 17:13:15

## 2023-11-23 NOTE — Progress Notes (Addendum)
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 of length and width. Aggressive debridement with [name] curette afterwards the tissue appears healthier. Left foot wound appears improved in the area on the dorsal right second toe is improved 08/30/16 patient developed [name] fever over the weekend and was seen in an urgent care. Felt to have [name] UTI and put on doxycycline . He has been since changed over the phone to Macrobid . After we took off the wrap on his right leg today the leg is swollen warm and erythematous, probably more likely the source of the fever 09/06/16; have been using collagen to the major left leg wound, silver alginate to the area on his anterior foot/toes 09/13/16; the areas on his anterior foot/toes on both sides appear to be virtually closed. Extensive wound on the left lateral leg perhaps slightly narrower but each visit still covered an adherent surface slough 09/16/16 patient was in for his usual Thursday nurse visit however the intake nurse noted significant erythema of his dorsal right foot. He is also running [name] low- grade fever and having increasing spasms in the right leg 09/20/16 here for cellulitis involving his right  great toes and forefoot. This is [name] lot better. Still requiring debridement on his left lateral leg. Santyl  direct says he needs prior authorization. Therefore his wife cannot change this at home 09/30/16; the patient's extensive area on the left lateral calf and ankle perhaps somewhat better. Using Santyl . The area on the left toes is healed and I think the area on his right dorsal foot is healed as well. There is no cellulitis or venous inflammation involving the right leg. He is going to need compression stockings here. 10/07/16; the patient's extensive wound on the left  lateral calf and ankle does not measure any differently however there appears to be less adherent surface slough using Santyl  and aggressive weekly debridements 10/21/16; no major change in the area on the left lateral calf. Still the same measurement still very difficult to debridement adherent slough and nonviable subcutaneous tissue. This is not really been helped by several weeks of Santyl . Previously for 2 weeks I used Iodoflex for [name] short period. [name] prolonged course of Hydrofera Blue didn't really help. I'[name] not sure why I only used 2 weeks of Iodoflex on this there is no evidence of surrounding infection. He has [name] small area on the right second toe which looks as though it's progressing towards closure 10/28/16; the wounds on his toes appear to be closed. No major change in the left lateral leg wound although the surface looks somewhat better using Iodoflex. He has had previous arterial studies that were normal. He has had reflux studies and is status post ablation although I don't have any exact notes on which vein was ablated. I'll need to check the surgical record 11/04/16; he's had [name] reopening between the first and second toe on the left and right. No major change in the left lateral leg wound. There is what appears to be cellulitis of the left dorsal foot 11/18/16 the patient was hospitalized initially in [place] and then subsequently transferred to [facility] and was admitted there from 11/09/16 through 11/12/16. He had developed progressive cellulitis on the right leg in spite of the doxycycline  I gave him. I'd spoken to the hospitalist in [place] who was concerned about continuing leukocytosis. CT scan is what I suggested this was done which showed soft tissue swelling without evidence of osteomyelitis or an underlying abscess blood cultures were negative. At [facility] he was treated with vancomycin  and Primaxin  and then add an infectious disease consult. He was transitioned to  Ceftaroline . He has been making progressive improvement. Overall [name] severe cellulitis of the right leg. He is been using silver alginate to her original wound on the left leg. The wounds in his toes on the right are closed there is [name] small open area on the base of the left second toe 11/26/15; the patient's right leg is much better although there is still some edema here this could be reminiscent from his severe cellulitis likely on top of some degree of lymphedema. His left anterior leg wound has less surface slough as reported by her intake nurse. Small wound at the base of the left second toe 12/02/16; patient's right leg is better and there is no open wound here. His left anterior lateral leg wound continues to have [name] healthy-looking surface. Small wound at the base of the left second toe however there is erythema in the left forefoot which is worrisome 12/16/16; is no open wounds on his right leg. We took measurements for stockings. His left anterior lateral leg wound continues to have  [name] healthy-looking surface. I'[name] not sure where we were with the Apligraf run through his insurance. We have been using Iodoflex. He has [name] thick eschar on the left first second toe interface, I suspect this may be fungal however there is no visible open 12/23/16; no open wound on his right leg. He has 2 small areas left of the linear wound that was remaining last week. We have been using Prisma, I thought I have disclosed this week, we can only look forward to next week 01/03/17; the patient had concerning areas of erythema last week, already on doxycycline  for UTI through his primary doctor. The erythema is absolutely no better there is warmth and swelling both medially from the left lateral leg wound and also the dorsal left foot. 01/06/17- Patient is here for follow-up evaluation of his left lateral leg ulcer and bilateral feet ulcers. He is on oral antibiotic therapy, tolerating that. Nursing staff and the patient states  that the erythema is improved from Monday. 01/13/17; the predominant left lateral leg wound continues to be problematic. I had put Apligraf on him earlier this month once. However he subsequently Dim[place], Sammuel [name] ([id number]) [id number] Page 8 of 40 developed what appeared to be an intense cellulitis around the left lateral leg wound. I gave him Dalvance  I think on 2/12 perhaps 2/13 he continues on cefdinir . The erythema is still present but the warmth and swelling is improved. I am hopeful that the cellulitis part of this control. I wouldn't be surprised if there is an element of venous inflammation as well. 01/17/17. The erythema is present but better in the left leg. His left lateral leg wound still does not have [name] viable surface buttons certain parts of this long thin wound it appears like there has been improvement in dimensions. 01/20/17; the erythema still present but much better in the left leg. I'[name] thinking this is his usual degree of chronic venous inflammation. The wound on the left leg looks somewhat better. Is less surface slough 01/27/17; erythema is back to the chronic venous inflammation. The wound on the left leg is somewhat better. I am back to the point where I like to try an Apligraf once again 02/10/17; slight improvement in wound dimensions. Apligraf #2. He is completing his doxycycline  02/14/17; patient arrives today having completed doxycycline  last Thursday. This was supposed to be [name] nurse visit however once again he hasn't tense erythema from the medial part of his wound extending over the lower leg. Also erythema in his foot this is roughly in the same distribution as last time. He has baseline chronic venous inflammation however this is [name] lot worse than the baseline I have learned to accept the on him is baseline inflammation 02/24/17- patient is here for follow-up evaluation. He is tolerating compression therapy. His voicing no complaints or concerns he is  here anticipating an Apligraf 03/03/17; he arrives today with an adherent necrotic surface. I don't think this is surface is going to be amenable for Apligraf's. The erythema around his wound and on the left dorsal foot has resolved he is off antibiotics 03/10/17; better-looking surface today. I don't think he can tolerate Apligraf's. He tells me he had [name] wound VAC after [name] skin graft years ago to this area and they had difficulty with [name] seal. The erythema continues to be stable around this some degree of chronic venous inflammation but he also has recurrent cellulitis. We have been using Iodoflex 03/17/17; continued improvement in the surface and may be  small changes in dimensions. Using Iodoflex which seems the only thing that will control his surface 03/24/17- He is here for follow up evaluation of his LLE lateral ulceration and ulcer to right dorsal foot/toe space. He is voicing no complaints or concerns, He is tolerating compression wrap. 03/31/17 arrives today with [name] much healthier looking wound on the left lower extremity. We have been using Iodoflex for [name] prolonged period of time which has for the first time prepared and adequate looking wound bed although we have not had much in the way of wound dimension improvement. He also has [name] small wound between the first and second toe on the right 04/07/17; arrives today with [name] healthy-looking wound bed and at least the top 50% of this wound appears to be now her. No debridement was required I have changed him to Hydrofera Blue last week after prolonged Iodoflex. He did not do well with Apligraf's. We've had [name] re-opening between the first and second toe on the right 04/14/17; arrives today with [name] healthier looking wound bed contractions and the top 50% of this wound and some on the lesser 50%. Wound bed appears healthy. The area between the first and second toe on the right still remains problematic 04/21/17; continued very gradual improvement. Using  Hydrofera Blue 04/28/17; continued very gradual improvement in the left lateral leg venous insufficiency wound. His periwound erythema is very mild. We have been using Hydrofera Blue. Wound is making progress especially in the superior 50% 05/05/17; he continues to have very gradual improvement in the left lateral venous insufficiency wound. Both in terms with an length rings are improving. I debrided this every 2 weeks with #5 curet and we have been using Hydrofera Blue and again making good progress With regards to the wounds between his right first and second toe which I thought might of been tinea pedis he is not making as much progress very dry scaly skin over the area. Also the area at the base of the left first and second toe in [name] similar condition 05/12/17; continued gradual improvement in the refractory left lateral venous insufficiency wound on the left. Dimension smaller. Surface still requiring debridement using Hydrofera Blue 05/19/17; continued gradual improvement in the refractory left lateral venous ulceration. Careful inspection of the wound bed underlying rumination suggested some degree of epithelialization over the surface no debridement indicated. Continue Hydrofera Blue difficult areas between his toes first and third on the left than first and second on the right. I'[name] going to change to silver alginate from silver collagen. Continue ketoconazole as I suspect underlying tinea pedis 05/26/17; left lateral leg venous insufficiency wound. We've been using Hydrofera Blue. I believe that there is expanding epithelialization over the surface of the wound albeit not coming from the wound circumference. This is [name] bit of an odd situation in which the epithelialization seems to be coming from the surface of the wound rather than in the exact circumference. There is still small open areas mostly along the lateral margin of the wound. He has unchanged areas between the left first and second and the  right first second toes which I been treating for tenia pedis 06/02/17; left lateral leg venous insufficiency wound. We have been using Hydrofera Blue. Somewhat smaller from the wound circumference. The surface of the wound remains [name] bit on it almost epithelialized sedation in appearance. I use an open curette today debridement in the surface of all of this especially the edges Small open wounds remaining on the dorsal  right first and second toe interspace and the plantar left first second toe and her face on the left 06/09/17; wound on the left lateral leg continues to be smaller but very gradual and very dry surface using Hydrofera Blue 06/16/17 requires weekly debridements now on the left lateral leg although this continues to contract. I changed to silver collagen last week because of dryness of the wound bed. Using Iodoflex to the areas on his first and second toes/web space bilaterally 06/24/17; patient with history of paraplegia also chronic venous insufficiency with lymphedema. Has [name] very difficult wound on the left lateral leg. This has been gradually reducing in terms of with but comes in with [name] very dry adherent surface. High switch to silver collagen [name] week or so ago with hydrogel to keep the area moist. This is been refractory to multiple dressing attempts. He also has areas in his first and second toes bilaterally in the anterior and posterior web space. I had been using Iodoflex here after [name] prolonged course of silver alginate with ketoconazole was ineffective [question tinea pedis] 07/14/17; patient arrives today with [name] very difficult adherent material over his left lateral lower leg wound. He also has surrounding erythema and poorly controlled edema. He was switched his Santyl  last visit which the nurses are applying once during his doctor visit and once on [name] nurse visit. He was also reduced to 2 layer compression I'[name] not exactly sure of the issue here. 07/21/17; better surface today after 1  week of Iodoflex. Significant cellulitis that we treated last week also better. [Doxycycline ] 07/28/17 better surface today with now 2 weeks of Iodoflex. Significant cellulitis treated with doxycycline . He has now completed the doxycycline  and he is back to his usual degree of chronic venous inflammation/stasis dermatitis. He reminds me he has had ablations surgery here 08/04/17; continued improvement with Iodoflex to the left lateral leg wound in terms of the surface of the wound although the dimensions are better. 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 Problematic areas on the plantar aspect of the first second toe web space on the left and the dorsal aspect of the first second toe web space on the right. At one point I felt these were probably related to chronic fungal infections in treated him aggressively for this although we have not made any improvement here. 08/11/17; left lateral leg. Surface continues to improve with the Iodoflex although we are not seeing much improvement in overall wound dimensions. Areas on his plantar left foot and right foot show no improvement. In fact the right foot looks somewhat worse 08/18/17; left lateral leg. We changed to Hydrofera Blue last week after [name] prolonged course of Iodoflex which helps get the surface better. It appears that the wound with is improved. Continue with difficult areas on the left dorsal first second and plantar first second on the right 09/01/17; patient arrives in clinic today having had [name] temperature of 103 yesterday. He was seen in the ER and [facility]. The patient was concerned he could have cellulitis again in the right leg however they diagnosed him with [name] UTI and he is now on Keflex . He has [name] history of cellulitis which is been recurrent and difficult but this is been in the left leg, in the past 5 use doxycycline . He does in and out catheterizations at home which  are risk factors for UTI 09/08/17; patient will be completing his Keflex  this weekend. The erythema  on the left leg is considerably better. He has [name] new wound today on the medial part of the right leg small superficial almost looks like [name] skin tear. He has worsening of the area on the right dorsal first and second toe. His major area on the left lateral leg is better. Using Hydrofera Blue on all areas 09/15/17; gradual reduction in width on the long wound in the left lateral leg. No debridement required. He also has wounds on the plantar aspect of his left first second toe web space and on the dorsal aspect of the right first second toe web space. 09/22/17; there continues to be very gradual improvements in the dimensions of the left lateral leg wound. He hasn't round erythematous spot with might be pressure on his wheelchair. There is no evidence obviously of infection no purulence no warmth He has [name] dry scaled area on the plantar aspect of the left first second toe Improved area on the dorsal right first second toe. 09/29/17; left lateral leg wound continues to improve in dimensions mostly with an is still [name] fairly long but increasingly narrow wound. He has [name] dry scaled area on the plantar aspect of his left first second toe web space Increasingly concerning area on the dorsal right first second toe. In fact I am concerned today about possible cellulitis around this wound. The areas extending up his second toe and although there is deformities here almost appears to abut on the nailbed. 10/06/17; left lateral leg wound continues to make very gradual progress. Tissue culture I did from the right first second toe dorsal foot last time grew MRSA and enterococcus which was vancomycin  sensitive. This was not sensitive to clindamycin  or doxycycline . He is allergic to Zyvox and sulfa we have therefore [place], Humbert [name] ([id number]) [id number] Page 9 of 40 arrange for him to have  dalvance  infusion tomorrow. He is had this in the past and tolerated it well 10/20/17; left lateral leg wound continues to make decent progress. This is certainly reduced in terms of with there is advancing epithelialization.The cellulitis in the right foot looks better although he still has [name] deep wound in the dorsal aspect of the first second toe web space. Plantar left first toe web space on the left I think is making some progress 10/27/17; left lateral leg wound continues to make decent progress. Advancing epithelialization.using Hydrofera Blue The right first second toe web space wound is better-looking using silver alginate Improvement in the left plantar first second toe web space. Again using silver alginate 11/03/17 left lateral leg wound continues to make decent progress albeit slowly. Using Hydrofera Blue The right per second toe web space continues to be [name] very problematic looking punched out wound. I obtained [name] piece of tissue for deep culture I did extensively treated this for fungus. It is difficult to imagine that this is [name] pressure area as the patient states other than going outside he doesn't really wear shoes at home The left plantar first second toe web space looked fairly senescent. Necrotic edges. This required debridement change to [facility] Blue to all wound areas 11/10/17; left lateral leg wound continues to contract. Using Hydrofera Blue On the right dorsal first second toe web space dorsally. Culture I did of this area last week grew MRSA there is not an easy oral option in this patient was multiple antibiotic allergies or intolerances. This was only [name] rare culture isolate I'[name] therefore going to use Bactroban  under silver alginate On the left plantar  first second toe web space. Debridement is required here. This is also unchanged 11/17/17; left lateral leg wound continues to contract using Hydrofera Blue this is no longer the major issue. The major concern here is the  right first second toe web space. He now has an open area going from dorsally to the plantar aspect. There is now wound on the inner lateral part of the first toe. Not [name] very viable surface on this. There is erythema spreading medially into the forefoot. No major change in the left first second toe plantar wound 11/24/17; left lateral leg wound continues to contract using Hydrofera Blue. Nice improvement today The right first second toe web space all of this looks [name] lot less angry than last week. I have given him clindamycin  and topical Bactroban  for MRSA and terbinafine for the possibility of underlining tinea pedis that I could not control with ketoconazole. Looks somewhat better The area on the plantar left first second toe web space is weeping with dried debris around the wound 12/01/17; left lateral leg wound continues to contract he Hydrofera Blue. It is becoming thinner in terms of with nevertheless it is making good improvement. The right first second toe web space looks less angry but still [name] large necrotic-looking wounds starting on the plantar aspect of the right foot extending between the toes and now extensively on the base of the right second toe. I gave him clindamycin  and topical Bactroban  for MRSA anterior benefiting for the possibility of underlying tinea pedis. Not looking better today The area on the left first/second toe looks better. Debrided of necrotic debris 12/05/17* the patient was worked in urgently today because over the weekend he found blood on his incontinence bad when he woke up. He was found to have an ulcer by his wife who does most of his wound care. He came in today for us  to look at this. He has not had [name] history of wounds in his buttocks in spite of his paraplegia. 12/08/17; seen in follow-up today at his usual appointment. He was seen earlier this week and found to have [name] new wound on his buttock. We also follow him for wounds on the left lateral leg, left first  second toe web space and right first second toe web space 12/15/17; we have been using Hydrofera Blue to the left lateral leg which has improved. The right first second toe web space has also improved. Left first second toe web space plantar aspect looks stable. The left buttock has worsened using Santyl . Apparently the buttock has drainage 12/22/17; we have been using Hydrofera Blue to the left lateral leg which continues to improve now 2 small wounds separated by normal skin. He tells us  he had [name] fever up to 100 yesterday he is prone to UTIs but has not noted anything different. He does in and out catheterizations. The area between the first and second toes today does not look good necrotic surface covered with what looks to be purulent drainage and erythema extending into the third toe. I had gotten this to something that I thought look better last time however it is not look good today. He also has [name] necrotic surface over the buttock wound which is expanded. I thought there might be infection under here so I removed [name] lot of the surface with [name] #5 curet though nothing look like it really needed culturing. He is been using Santyl  to this area 12/27/17; his original wound on the left lateral leg continues  to improve using Hydrofera Blue. I gave him samples of Baxdella although he was unable to take them out of fear for an allergic reaction [lump in his throat].the culture I did of the purulent drainage from his second toe last week showed both enterococcus and [name] set Enterobacter I was also concerned about the erythema on the bottom of his foot although paradoxically although this looks somewhat better today. 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 We've been using silver alginate here. They came up today that he sleeps in [name] recliner, I'[name] not sure why but I asked him to stop this 01/03/18; his original wound  we've been using Hydrofera Blue is now separated into 2 areas. Ulcer on his left buttock is better he is off the recliner and sleeping in bed Finally both wound areas between his first and second toes also looks some better 01/10/18; his original wound on the left lateral leg is now separated into 2 wounds we've been using Hydrofera Blue Ulcer on his left buttock has some drainage. There is [name] small probing site going into muscle layer superiorly.using silver alginate -He arrives today with [name] deep tissue injury on the left heel The wound on the dorsal aspect of his first second toe on the left looks [name] lot betterusing silver alginate ketoconazole The area on the first second toe web space on the right also looks [name] lot bette 01/17/18; his original wound on the left lateral leg continues to progress using Hydrofera Blue Ulcer on his left buttock also is smaller surface healthier except for [name] small probing site going into the muscle layer superiorly. 2.4 cm of tunneling in this area DTI on his left heel we have only been offloading. Looks better than last week no threatened open no evidence of infection the wound on the dorsal aspect of the first second toe on the left continues to look like it's regressing we have only been using silver alginate and terbinafine orally The area in the first second toe web space on the right also looks to be [name] lot better using silver alginate and terbinafine I think this was prompted by tinea pedis 01/31/18; the patient was hospitalized in [place] last week apparently for [name] complicated UTI. He was discharged on cefepime  he does in and out catheterizations. In the hospital he was discovered [name] I don't mild elevation of AST and ALT and the terbinafine was stopped.predictably the pressure ulcer on s his buttock looks betterusing silver alginate. The area on the left lateral leg also is better using Hydrofera Blue. The area between the first and second toes on the left  better. First and second toes on the right still substantial but better. Finally the DTI on the left heel has held together and looks like it's resolving 02/07/18-he is here in follow-up evaluation for multiple ulcerations. He has new injury to the lateral aspect of the last issue [name] pressure ulcer, he states this is from adhesive removal trauma. He states he has tried multiple adhesive products with no success. All other ulcers appear stable. The left heel DTI is resolving. We will continue with same treatment plan and follow-up next week. 02/14/18; follow-up for multiple areas. He has [name] new area last week on the lateral aspect of his pressure ulcer more over the posterior trochanter. The original pressure ulcer looks quite stable has healthy granulation. 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 Almost fully epithelialized on the original superior area using Hydrofera Blue DTI on the left heel has peeled off this week to reveal [name] small superficial wound under denuded skin and subcutaneous tissue Both areas between the first and second toes look better including nothing open on the left 02/21/18; The patient's wounds on his left ischial tuberosity and posterior left greater trochanter actually looked better. He has [name] large area of irritation around the area which I think is contact dermatitis. I am doubtful that this is fungal His original wound on the left lateral calf continues to improve we have been using Hydrofera Blue There is no open area in the left first second toe web space although there is [name] lot of thick callus The DTI on the left heel required debridement today of necrotic surface eschar and subcutaneous tissue using silver alginate Finally the area on the right first second toe webspace continues to contract using silver alginate and ketoconazole Landi, Rayshard [name] ([id number])  [id number] Page 10 of 40 02/28/18 Left ischial tuberosity wounds look better using silver alginate. Original wound on the left calf only has one small open area left using Hydrofera Blue DTI on the left heel required debridement mostly removing skin from around this wound surface. Using silver alginate The areas on the right first/second toe web space using silver alginate and ketoconazole 03/08/18 on evaluation today patient appears to be doing decently well as best I can tell in regard to his wounds. This is the first time that I have seen him as he generally is followed by Dr. Rufus. With that being said none of his wounds appear to be infected he does have an area where there is some skin covering what appears to be [name] new wound on the left dorsal surface of his great toe. This is right at the nail bed. With that being said I do believe that debrided away some of the excess skin can be of benefit in this regard. Otherwise he has been tolerating the dressing changes without complication. 03/14/18; patient arrives today with the multiplicity of wounds that we are following. He has not been systemically unwell Original wound on the left lateral calf now only has 2 small open areas we've been using Hydrofera Blue which should continue The deep tissue injury on the left heel requires debridement today. We've been using silver alginate The left first second toe and the right first second toe are both are reminiscence what I think was tinea pedis. Apparently some of the callus Surface between the toes was removed last week when it started draining. Purulent drainage coming from the wound on the ischial tuberosity on the left. 03/21/18-He is here in follow-up evaluation for multiple wounds. 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 He tolerated debridement. The only change to last week's recommendations  is to discontinue antifungal cream between toes. He will follow-up next week 03/28/18; following up for multiple wounds;Concern this week is streaking redness and swelling in the right foot. He is going to need antibiotics for this. 03/31/18; follow-up for right foot cellulitis. Streaking redness and swelling in the right foot on 03/28/18. 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 I put him on clindamycin  300 mg every 6 and brought him in for [name] quick check. He has an open wound between his first and second toes on  the right foot as [name] potential source. 04/04/18; Right foot cellulitis is resolving he is completing clindamycin . This is truly good news Left lateral calf wound which is initial wound only has one small open area inferiorly this is close to healing out. He has compression stockings. We will use Hydrofera Blue right down to the epithelialization of this Nonviable surface on the left heel which was initially pressure with [name] DTI. We've been using Hydrofera Blue. I'[name] going to switch this back to silver alginate Left first second toe/tinea pedis this looks better using silver alginate Right first second toe tinea pedis using silver alginate Large pressure ulcers on theLeft ischial tuberosity. Small wound here Looks better. I am uncertain about the surface over the large wound. Using silver alginate 04/11/18; Cellulitis in the right foot is resolved Left lateral calf wound which was his original wounds still has 2 tiny open areas remaining this is just about closed Nonviable surface on the left heel is better but still requires debridement Left first second toe/tinea pedis still open using silver alginate Right first second toe wound tinea pedis I asked him to go back to using ketoconazole and silver alginate Large pressure ulcers on the left ischial tuberosity this shear injury here is resolved. Wound is smaller. No evidence of infection using silver  alginate 04/18/18; Patient arrives with an intense area of cellulitis in the right mid lower calf extending into the right heel area. Bright red and warm. Smaller area on the left anterior leg. He has [name] significant history of MRSA. He will definitely need antibioticsdoxycycline He now has 2 open areas on the left ischial tuberosity the original large wound and now [name] satellite area which I think was above his initial satellite areas. Not [name] wonderful surface on this satellite area surrounding erythema which looks like pressure related. His left lateral calf wound again his original wound is just about closed Left heel pressure injury still requiring debridement Left first second toe looks [name] lot better using silver alginate Right first second toe also using silver alginate and ketoconazole cream also looks better 04/20/18; the patient was worked in early today out of concerns with his cellulitis on the right leg. I had started him on doxycycline . This was 2 days ago. His wife was concerned about the swelling in the area. Also concerned about the left buttock. He has not been systemically unwell no fever chills. No nausea vomiting or diarrhea 04/25/18; the patient's left buttock wound is continued to deteriorate he is using Hydrofera Blue. He is still completing clindamycin  for the cellulitis on the right leg although all of this looks better. 05/02/18 Left buttock wound still with [name] lot of drainage and [name] very tightly adherent fibrinous necrotic surface. He has [name] deeper area superiorly The left lateral calf wound is still closed DTI wound on the left heel necrotic surface especially the circumference using Iodoflex Areas between his left first second toe and right first second toe both look better. Dorsally and the right first second toe he had [name] necrotic surface although at smaller. In using silver alginate and ketoconazole. I did [name] culture last week which was [name] deep tissue culture of the reminiscence  of the open wound on the right first second toe dorsally. This grew [name] few Acinetobacter and [name] few methicillin-resistant staph aureus. Nevertheless the area actually this week looked better. I didn't feel the need to specifically address this at least in terms of systemic antibiotics. 05/09/18; wounds are measuring larger more  drainage per our intake. We are using Santyl  covered with alginate on the large superficial buttock wounds, Iodosorb on the left heel, ketoconazole and silver alginate to the dorsal first and second toes bilaterally. 05/16/18; The area on his left buttock better in some aspects although the area superiorly over the ischial tuberosity required an extensive debridement.using Santyl  Left heel appears stable. Using Iodoflex The areas between his first and second toes are not bad however there is spreading erythema up the dorsal aspect of his left foot this looks like cellulitis again. He is insensate the erythema is really very brilliant.o Erysipelas He went to see an allergist days ago because he was itching part of this he had lab work done. This showed [name] white count of 15.1 with 70% neutrophils. Hemoglobin of 11.4 and [name] platelet count of 659,000. Last white count we had in Epic was [name] 2-1/2 years ago which was 25.9 but he was ill at the time. He was able to show me some lab work that was done by his primary physician the pattern is about the same. 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 But prompted me to go ahead and do x-rays of both feet and the pelvis rule out osteomyelitis. He also had [name] comprehensive metabolic panel this was reasonably normal his albumin was 3.7 liver function tests BUN/creatinine all normal 05/23/18; x-rays of both his feet from last week were negative for underlying pulmonary abnormality. The x-ray of his pelvis however showed mild irregularity in the left ischial which may represent some early osteomyelitis. The  wound in the left ischial continues to get deeper clearly now exposed muscle. Each week necrotic surface material over this area. Whereas the rest of the wounds do not look so bad. The left ischial wound we have been using Santyl  and calcium alginate T the left heel surface necrotic debris using Iodoflex o The left lateral leg is still healed Areas on the left dorsal foot and the right dorsal foot are about the same. There is some inflammation on the left which might represent contact dermatitis, fungal dermatitis I am doubtful cellulitis although this looks better than last week 05/30/18; CT scan done at Hospital did not show any osteomyelitis or abscess. Suggested the possibility of underlying cellulitis although I don't see [name] lot of evidence of this at the bedside The wound itself on the left buttock/upper thigh actually looks somewhat better. No debridement Left heel also looks better no debridement continue Iodoflex Both dorsal first second toe spaces appear better using Lotrisone. Left still required debridement 06/06/18; Intake reported some purulent looking drainage from the left gluteal wound. Using Santyl  and calcium alginate Left heel looks better although still [name] nonviable surface requiring debridement Nicklas, Jared [name] ([id number]) [id number] Page 11 of 40 The left dorsal foot first/second webspace actually expanding and somewhat deeper. I may consider doing [name] shave biopsy of this area Right dorsal foot first/second webspace appears stable to improved. Using Lotrisone and silver alginate to both these areas 06/13/18 Left gluteal surface looks better. Now separated in the 2 wounds. No debridement required. Still drainage. We'll continue silver alginate Left heel continues to look better with Iodoflex continue this for at least another week Of his dorsal foot wounds the area on the left still has some depth although it looks better than last week. We've been using  Lotrisone and silver alginate 06/20/18 Left gluteal continues to look better healthy tissue Left heel continues  to look better healthy granulation wound is smaller. He is using Iodoflex and his long as this continues continue the Iodoflex Dorsal right foot looks better unfortunately dorsal left foot does not. There is swelling and erythema of his forefoot. He had minor trauma to this several days ago but doesn't think this was enough to have caused any tissue injury. Foot looks like cellulitis, we have had this problem before 06/27/18 on evaluation today patient appears to be doing [name] little worse in regard to his foot ulcer. 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 Both of which would really be his only options for treating this infection. In the past he has been given and effusion of Orbactiv. This is done very well for him in the past again it's one time dosing IV antibiotic therapy. Subsequently I do believe this is something we're gonna need to see about doing at this point in time. Currently his other wounds seem to be doing somewhat better in my pinion I'[name] pretty happy in that regard. 07/03/18 on evaluation today patient's wounds actually appear to be doing fairly well. He has been tolerating the dressing changes without complication. All in all he seems to be showing signs of improvement. In regard to the antibiotics he has been dealing with infectious disease since I saw him last week as far as getting this scheduled. In the end [facility]'s [facility] this coming Friday. In the meantime he has been continuing to perform the dressing changes in such as previous. There does not appear to be any evidence of infection worsengin at this time. 07/10/18; Since I last saw this man 2 weeks ago things have actually  improved. IV antibiotics of resulted in less forefoot erythema although there is still some present. He is not systemically unwell Left buttock wounds 2 now have no depth there is increased epithelialization Using silver alginate Left heel still requires debridement using Iodoflex Left dorsal foot still with [name] sizable wound about the size of [name] border but healthy granulation Right dorsal foot still with [name] slitlike area using silver alginate 07/18/18; the patient's cellulitis in the left foot is improved in fact I think it is on its way to resolving. Left buttock wounds 2 both look better although the larger one has hypertension granulation we've been using silver alginate Left heel has some thick circumferential redundant skin over the wound edge which will need to be removed today we've been using Iodoflex Left dorsal foot is still [name] sizable wound required debridement using silver alginate The right dorsal foot is just about closed only [name] small open area remains here 07/25/18; left foot cellulitis is resolved Left buttock wounds 2 both look better. Hyper-granulation on the major area Left heel as some debris over the surface but otherwise looks [name] healthier wound. Using silver collagen Right dorsal foot is just about closed 07/31/18; arrives with our intake nurse worried about purulent drainage from the buttock. We had hyper-granulation here last week His buttock wounds 2 continue to look better Left heel some debris over the surface but measuring smaller. Right dorsal foot unfortunately has openings between the toes Left foot superficial wound looks less aggravated. 08/07/18 Buttock wounds continue to look better although some of her granulation and the larger medial wound. silver alginate Left heel continues  to look [name] lot better.silver collagen Left foot superficial wound looks less stable. Requires debridement. He has [name] new wound superficial area on the foot on the lateral dorsal foot. Right foot  looks better using silver alginate without Lotrisone 08/14/2018; patient was in the ER last week diagnosed with [name] UTI. He is now on Cefpodoxime and Macrodantin . Buttock wounds continued to be smaller. Using silver alginate Left heel continues to look better using silver collagen Left foot superficial wound looks as though it is improving Right dorsal foot area is just about healed. 08/21/2018; patient is completed his antibiotics for his UTI. He has 2 open areas on the buttocks. There is still not closed although the surface looks satisfactory. Using silver alginate Left heel continues to improve using silver collagen The bilateral dorsal foot areas which are at the base of his first and second toes/possible tinea pedis are actually stable on the left but worse on the right. The area on the left required debridement of necrotic surface. After debridement I obtained [name] specimen for PCR culture. The right dorsal foot which is been just about healed last week is now reopened 08/28/2018; culture done on the left dorsal foot showed coag negative staph both staph epidermidis and Lugdunensis. I think this is worthwhile initiating systemic treatment. I will use doxycycline  given his long list of allergies. The area on the left heel slightly improved but still requiring debridement. The large wound on the buttock is just about closed whereas the smaller one is larger. Using silver alginate in this area 09/04/2018; patient is completing his doxycycline  for the left foot although this continues to be [name] very difficult wound area with very adherent necrotic debris. We are using silver alginate to all his wounds right foot left foot and the small wounds on his buttock, silver collagen on the left heel. 09/11/2018; once again this patient has intense erythema and swelling of the left forefoot. Lesser degrees of erythema in the right foot. He has [name] long list of allergies and intolerances. I will reinstitute  doxycycline . 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 Using silver alginate Left heel also looks better using silver collagen Unfortunately both the areas on his feet look worse. The area on the left first second webspace is now gone through to the plantar part of his foot. The area on the left foot anteriorly is irritated with erythema and swelling in the forefoot. 09/25/2018 His wound on the left plantar heel looks better. Using silver collagen The area on the left buttock 2 small remnant areas. One is closed one is still open. Using silver alginate The areas between both his first and second toes look worse. This in spite of long-standing antifungal therapy with ketoconazole and silver alginate which should have antifungal activity He has small areas around his original wound on the left calf one is on the bottom of the original scar tissue and one superiorly both of these are small and superficial but again given wound history in this site this is worrisome 10/02/2018 Left plantar heel continues to gradually contract using silver collagen Left buttock wound is unchanged using silver alginate The areas on his dorsal feet between his first and second toes bilaterally look about the same. I prescribed clindamycin  ointment to see if we can address chronic staph colonization and also the underlying possibility of erythrasma The left lateral lower extremity wound is actually on the lateral part of his ankle.  Small open area here. We have been using silver alginate 10/09/2018; Left plantar heel continues to look healthy and contract. No debridement is required Left buttock slightly smaller with [name] tape injury wound just below which was new this week Dorsal feet somewhat improved I have been using clindamycin  Left lateral looks lower extremity the actual open area looks worse although [name] lot of this is epithelialized. I am going to change to silver  collagen today He has [name] lot more swelling in the right leg although this is not pitting not red and not particularly warm there is [name] lot of spasm in the right leg usually indicative Colbaugh, Puneet [name] ([id number]) [id number] Page 12 of 40 of people with paralysis of some underlying discomfort. We have reviewed his vascular status from 2017 he had [name] left greater saphenous vein ablation. I wonder about referring him back to vascular surgery if the area on the left leg continues to deteriorate. 10/16/2018 in today for follow-up and management of multiple lower extremity ulcers. His left Buttock wound is much lower smaller and almost closed completely. The wound to the left ankle has began to reopen with Epithelialization and some adherent slough. He has multiple new areas to the left foot and leg. The left dorsal foot without much improvement. Wound present between left great webspace and 2nd toe. Erythema and edema present right leg. Right LE ultrasound obtained on 10/10/18 was negative for DVT . 10/23/2018; Left buttock is closed over. Still dry macerated skin but there is no open wound. I suspect this is chronic pressure/moisture Left lateral calf is quite [name] bit worse than when I saw this last. There is clearly drainage here he has macerated skin into the left plantar heel. We will change the primary dressing to alginate Left dorsal foot has some improvement in overall wound area. Still using clindamycin  and silver alginate Right dorsal foot about the same as the left using clindamycin  and silver alginate The erythema in the right leg has resolved. He is DVT rule out was negative Left heel pressure area required debridement although the wound is smaller and the surface is health 10/26/2018 The patient came back in for his nurse check today predominantly because of the drainage coming out of the left lateral leg with [name] recent reopening of his original wound on the left lateral  calf. He comes in today with [name] large amount of surrounding erythema around the wound extending from the calf into the ankle and even in the area on the dorsal foot. He is not systemically unwell. He is not febrile. Nevertheless this looks like cellulitis. We have been using silver alginate to the area. I changed him to [name] regular visit and I am going to prescribe him doxycycline . The rationale here is [name] long list of medication intolerances and [name] history of MRSA. I did not see anything that I thought would provide [name] valuable culture 10/30/2018 Follow-up from his appointment 4 days ago with really an extensive area of cellulitis in the left calf left lateral ankle and left dorsal foot. I put him on doxycycline . He has [name] long list of medication allergies which are true allergy reactions. 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 In any case he is [name] lot better today. The erythema especially in the anterior and lateral left calf is better. He still has left ankle erythema. He also is complaining about increasing edema in the right leg we have  only been using Kerlix Coban and he has been doing the wraps at home. Finally he has [name] spotty rash on the medial part of his upper left calf which looks like folliculitis or perhaps wrap occlusion type injury. Small superficial macules not pustules 11/06/18 patient arrives today with again [name] considerable degree of erythema around the wound on the left lateral calf extending into the dorsal ankle and dorsal foot. This is [name] lot worse than when I saw this last week. He is on doxycycline  really with not [name] lot of improvement. He has not been systemically unwell Wounds on the; left heel actually looks improved. Original area on the left foot and proximity to the first and second toes looks about the same. He has superficial areas on the dorsal foot, anterior calf and then the reopening of his original wound  on the left lateral calf which looks about the same The only area he has on the right is the dorsal webspace first and second which is smaller. He has [name] large area of dry erythematous skin on the left buttock small open area here. 11/13/2018; the patient arrives in much better condition. The erythema around the wound on the left lateral calf is [name] lot better. Not sure whether this was the clindamycin  or the TCA and ketoconazole or just in the improvement in edema control [stasis dermatitis]. In any case this is [name] lot better. The area on the left heel is very small and just about resolved using silver collagen we have been using silver alginate to the areas on his dorsal feet 11/20/2018; his wounds include the left lateral calf, left heel, dorsal aspects of both feet just proximal to the first second webspace. He is stable to slightly improved. I did not think any changes to his dressings were going to be necessary 11/27/2018 he has [name] reopening on the left buttock which is surrounded by what looks like tinea or perhaps some other form of dermatitis. The area on the left dorsal foot has some erythema around it I have marked this area but I am not sure whether this is cellulitis or not. Left heel is not closed. Left calf the reopening is really slightly longer and probably worse 1/13; in general things look better and smaller except for the left dorsal foot. Area on the left heel is just about closed, left buttock looks better only [name] small wound remains in the skin looks better [using Lotrisone] 1/20; the area on the left heel only has [name] few remaining open areas here. Left lateral calf about the same in terms of size, left dorsal foot slightly larger right lateral foot still not closed. The area on the left buttock has no open wound and the surrounding skin looks [name] lot better 1/27; the area on the left heel is closed. Left lateral calf better but still requiring extensive debridements. The area on his left  buttock is closed. He still has the open areas on the left dorsal foot which is slightly smaller in the right foot which is slightly expanded. We have been using Iodoflex on these areas as well 2/3; left heel is closed. Left lateral calf still requiring debridement using Iodoflex there is no open area on his left buttock however he has dry scaly skin over [name] large area of this. Not really responding well to the Lotrisone. Finally the areas on his dorsal feet at the level of the first second webspace are slightly smaller on the right and about the same  on the left. Both of these vigorously debrided with Anasept and gauze 2/10; left heel remains closed he has dry erythematous skin over the left buttock but there is no open wound here. Left lateral leg has come in and with. Still requiring debridement we have been using Iodoflex here. Finally the area on the left dorsal foot and right dorsal foot are really about the same extremely dry callused fissured areas. He does not yet have [name] dermatology appointment 2/17; left heel remains closed. He has [name] new open area on the left buttock. The area on the left lateral calf is bigger longer and still covered in necrotic debris. No major change in his foot areas bilaterally. I am awaiting for [name] dermatologist to look on this. We have been using ketoconazole I do not know that this is been doing any good at all. 2/24; left heel remains closed. The left buttock wound that was new reopening last week looks better. The left lateral calf appears better also although still requires debridement. The major area on his foot is the left first second also requiring debridement. We have been putting Prisma on all wounds. I do not believe that the ketoconazole has done too much good for his feet. He will use Lotrisone I am going to give him [name] 2-week course of terbinafine. We still do not have [name] dermatology appointment 3/2 left heel remains closed however there is skin over bone  in this area I pointed this out to him today. The left buttock wound is epithelialized but still does not look completely stable. The area on the left leg required debridement were using silver collagen here. With regards to his feet we changed to Lotrisone last week and silver alginate. 3/9; left heel remains closed. Left buttock remains closed. The area on the right foot is essentially closed. The left foot remains unchanged. Slightly smaller on the left lateral calf. Using silver collagen to both of these areas 3/16-Left heel remains closed. Area on right foot is closed. Left lateral calf above the lateral malleolus open wound requiring debridement with easy bleeding. Left dorsal wound proximal to first toe also debrided. Left ischial area open new. Patient has been using Prisma with wrapping every 3 days. Dermatology appointment is apparently tomorrow.Patient has completed his terbinafine 2-week course with some apparent improvement according to him, there is still flaking and dry skin in his foot on the left 3/23; area on the right foot is reopened. The area on the left anterior foot is about the same still [name] very necrotic adherent surface. He still has the area on the left leg and reopening is on the left buttock. He apparently saw dermatology although I do not have [name] note. According to the patient who is usually fairly well informed they did not have any good ideas. Put him on oral terbinafine which she is been on before. 3/30; using silver collagen to all wounds. Apparently his dermatologist put him on doxycycline  and rifampin presumably some culture grew staph. I do not have this result. He remains on terbinafine although I have used terbinafine on him before 4/6; patient has had [name] fairly substantial reopening on the right foot between the first and second toes. He is finished his terbinafine and I believe is on doxycycline  and rifampin still as prescribed by dermatology. We have been using  silver collagen to all his wounds although the patient reports that he thinks silver alginate does better on the wounds on his buttock. 4/13; the area  on his left lateral calf about the same size but it did not require debridement. Left dorsal foot just proximal to the webspace between the first and second toes is about the same. Still nonviable surface. I note some superficial bronze discoloration of the dorsal part of his foot Right dorsal foot just proximal to the first and second toes also looks about the same. I still think there may be the same discoloration I noted above on the left Left buttock wound looks about the same 4/20; left lateral calf appears to be gradually contracting using silver collagen. Heiland, [place]a [name] ([id number]) [id number] Page 13 of 40 He remains on erythromycin empiric treatment for possible erythrasma involving his digital spaces. The left dorsal foot wound is debrided of tightly adherent necrotic debris and really cleans up quite nicely. The right area is worse with expansion. I did not debride this it is now over the base of the second toe The area on his left buttock is smaller no debridement is required using silver collagen 5/4; left calf continues to make good progress. He arrives with erythema around the wounds on his dorsal foot which even extends to the plantar aspect. Very concerning for coexistent infection. He is finished the erythromycin I gave him for possible erythrasma this does not seem to have helped. The area on the left foot is about the same base of the dorsal toes Is area on the buttock looks improved on the left 5/11; left calf and left buttock continued to make good progress. Left foot is about the same to slightly improved. Major problem is on the right foot. He has not had an x-ray. Deep tissue culture I did last week showed both Enterobacter and [name] coli. I did not change the doxycycline  I put him on empirically  although neither 1 of these were plated to doxycycline . He arrives today with the erythema looking worse on both the dorsal and plantar foot. Macerated skin on the bottom of the foot. he has not been systemically unwell 5/18-Patient returns at 1 week, left calf wound appears to be making some progress, left buttock wound appears slightly worse than last time, left foot wound looks slightly better, right foot redness is marginally better. X-ray of both feet show no air or evidence of osteomyelitis. Patient is finished his Omnicef  and terbinafine. He continues to have macerated skin on the bottom of the left foot as well as right 5/26; left calf wound is better, left buttock wound appears to have multiple small superficial open areas with surrounding macerated skin. X-rays that I did last time showed no evidence of osteomyelitis in either foot. He is finished cefdinir  and doxycycline . I do not think that he was on terbinafine. He continues to have [name] large superficial open area on the right foot anterior dorsal and slightly between the first and second toes. I did send him to dermatology 2 months ago or so wondering about whether they would do [name] fungal scraping. I do not believe they did but did do [name] culture. We have been using silver alginate to the toe areas, he has been using antifungals at home topically either ketoconazole or Lotrisone. We are using silver collagen on the left foot, silver alginate on the right, silver collagen on the left lateral leg and silver alginate on the left buttock 6/1; left buttock area is healed. We have the left dorsal foot, left lateral leg and right dorsal foot. We are using silver alginate to the areas on both feet  and silver collagen to the area on his left lateral calf 6/8; the left buttock apparently reopened late last week. He is not really sure how this happened. He is tolerating the terbinafine. Using silver alginate to all wounds 6/15; left buttock wound is  larger than last week but still superficial. Came in the clinic today with [name] report of purulence from the left lateral leg I did not identify any infection Both areas on his dorsal feet appear to be better. He is tolerating the terbinafine. Using silver alginate to all wounds 6/22; left buttock is about the same this week, left calf quite [name] bit better. His left foot is about the same however he comes in with erythema and warmth in the right forefoot once again. Culture that I gave him in the beginning of May showed Enterobacter and [name] coli. I gave him doxycycline  and things seem to improve although neither 1 of these organisms was specifically plated. 6/29; left buttock is larger and dry this week. Left lateral calf looks to me to be improved. Left dorsal foot also somewhat improved right foot completely unchanged. The erythema on the right foot is still present. He is completing the Ceftin dinner that I gave him empirically [see discussion above.) 7/6 - All wounds look to be stable and perhaps improved, the left buttock wound is slightly smaller, per patient bleeds easily, completed ceftin, the right foot redness is less, he is on terbinafine 7/13; left buttock wound about the same perhaps slightly narrower. Area on the left lateral leg continues to narrow. Left dorsal foot slightly smaller right foot about the same. We are using silver alginate on the right foot and Hydrofera Blue to the areas on the left. Unna boot on the left 2 layer compression on the right 7/20; left buttock wound absolutely the same. Area on lateral leg continues to get better. Left dorsal foot require debridement as did the right no major change in the 7/27; left buttock wound the same size necrotic debris over the surface. The area on the lateral leg is closed once again. His left foot looks better right foot about the same although there is some involvement now of the posterior first second toe area. He is still on  terbinafine which I have given him for [name] month, not certain [name] centimeter major change 06/25/19-All wounds appear to be slightly improved according to report, left buttock wound looks clean, both foot wounds have minimal to no debris the right dorsal foot has minimal slough. We are using Hydrofera Blue to the left and silver alginate to the right foot and ischial wound. 8/10-Wounds all appear to be around the same, the right forefoot distal part has some redness which was not there before, however the wound looks clean and small. Ischial wound looks about the same with no changes 8/17; his wound on the left lateral calf which was his original chronic venous insufficiency wound remains closed. Since I last saw him the areas on the left dorsal foot right dorsal foot generally appear better but require debridement. The area on his left initial tuberosity appears somewhat larger to me perhaps hyper granulated and bleeds very easily. We have been using Hydrofera Blue to the left dorsal foot and silver alginate to everything else 8/24; left lateral calf remains closed. The areas on his dorsal feet on the webspace of the first and second toes bilaterally both look better. The area on the left buttock which is the pressure ulcer stage II slightly smaller.  I change the dressing to Hydrofera Blue to all areas 8/31; left lateral calf remains closed. The area on his dorsal feet bilaterally look better. Using Hydrofera Blue. Still requiring debridement on the left foot. No change in the left buttock pressure ulcers however 9/14; left lateral calf remains closed. Dorsal feet look quite [name] bit better than 2 weeks ago. Flaking dry skin also [name] lot better with the ammonium lactate I gave him 2 weeks ago. The area on the left buttock is improved. He states that his Roho cushion developed [name] leak and he is getting [name] new one, in the interim he is offloading this vigorously 9/21; left calf remains closed. Left heel which was [name]  possible DTI looks better this week. He had macerated tissue around the left dorsal foot right foot looks satisfactory and improved left buttock wound. I changed his dressings to his feet to silver alginate bilaterally. Continuing Hydrofera Blue on the left buttock. 9/28 left calf remains closed. Left heel did not develop anything [possible DTI] dry flaking skin on the left dorsal foot. Right foot looks satisfactory. Improved left buttock wound. We are using silver alginate on his feet Hydrofera Blue on the buttock. I have asked him to go back to the Lotrisone on his feet including the wounds and surrounding areas 10/5; left calf remains closed. The areas on the left and right feet about the same. [name] lot of this is epithelialized however debris over the remaining open areas. He is using Lotrisone and silver alginate. The area on the left buttock using Hydrofera Blue 10/26. Patient has been out for 3 weeks secondary to Covid concerns. He tested negative but I think his wife tested positive. He comes in today with the left foot substantially worse, right foot about the same. Even more concerning he states that the area on his left buttock closed over but then reopened and is considerably deeper in one aspect than it was before [stage III wound] 11/2; left foot really about the same as last week. Quarter sized wound on the dorsal foot just proximal to the first second toes. Surrounding erythema with areas of denuded epithelium. This is not really much different looking. Did not look like cellulitis this time however. Right foot area about the same.. We have been using silver alginate alginate on his toes Left buttock still substantial irritated skin around the wound which I think looks somewhat better. We have been using Hydrofera Blue here. 11/9; left foot larger than last week and [name] very necrotic surface. Right foot I think is about the same perhaps slightly smaller. Debris around the  circumference also addressed. Unfortunately on the left buttock there is been [name] decline. Satellite lesions below the major wound distally and now [name] an additional one posteriorly we have been using Hydrofera Blue but I think this is [name] pressure issue 11/16; left foot ulcer dorsally again [name] very adherent necrotic surface. Right foot is about the same. Not much change in the pressure ulcer on his left buttock. 11/30; left foot ulcer dorsally basically the same as when I saw him 2 weeks ago. Very adherent fibrinous debris on the wound surface. Patient reports [name] lot of drainage as well. 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 We have been using Iodoflex, patient changed back to alginate because of the drainage. Area on his right dorsal foot really looks benign with [name] healthier surface certainly [name] lot better than on the left. Left buttock  wounds all improved using Hydrofera Blue 12/7; left dorsal foot again no improvement. Tightly adherent debris. PCR culture I did last week only showed likely skin contaminant. I have gone ahead and done [name] punch biopsy of this which is about the last thing in terms of investigations I can think to do. He has known venous insufficiency and venous hypertension and this could be the issue here. The area on the right foot is about the same left buttock slightly worse according to our intake nurse secondary to [facility] Blue sticking to the wound 12/14; biopsy of the left foot that I did last time showed changes that could be related to wound healing/chronic stasis dermatitis phenomenon no neoplasm. We have been using silver alginate to both feet. I change the one on the left today to Sorbact and silver alginate to his other 2 wounds 12/28; the patient arrives with the following problems; Robotham, [place] [name] ([id number]) [id number] Page 14 of 40 Major issue is the dorsal left foot which continues to be [name]  larger deeper wound area. Still with [name] completely nonviable surface Paradoxically the area mirror image on the right on the right dorsal foot appears to be getting better. He had some loss of dry denuded skin from the lower part of his original wound on the left lateral calf. Some of this area looked [name] little vulnerable and for this reason we put him in wrap that on this side this week The area on his left buttock is larger. He still has the erythematous circular area which I think is [name] combination of pressure, sweat. This does not look like cellulitis or fungal dermatitis 11/26/2019; -Dorsal left foot large open wound with depth. Still debris over the surface. Using Sorbact The area on the dorsal right foot paradoxically has closed over He has [name] reopening on the left ankle laterally at the base of his original wound that extended up into the calf. This appears clean. The left buttock wound is smaller but with very adherent necrotic debris over the surface. We have been using silver alginate here as well The patient had arterial studies done in 2017. He had biphasic waveforms at the dorsalis pedis and posterior tibial bilaterally. ABI in the left was 1.17. Digit waveforms were dampened. He has slight spasticity in the great toes I do not think [name] TBI would be possible 1/11; the patient comes in today with [name] sizable reopening between the first and second toes on the right. This is not exactly in the same location where we have been treating wounds previously. According to our intake nurse this was actually fairly deep but 0.6 cm. The area on the left dorsal foot looks about the same the surface is somewhat cleaner using Sorbact, his MRI is in 2 days. We have not managed yet to get arterial studies. The new reopening on the left lateral calf looks somewhat better using alginate. The left buttock wound is about the same using alginate 1/18; the patient had his ARTERIAL studies which were quite normal. ABI  in the right at 1.13 with triphasic/biphasic waveforms on the left ABI 1.06 again with triphasic/biphasic waveforms. It would not have been possible to have done [name] toe brachial index because of spasticity. We have been using Sorbac to the left foot alginate to the rest of his wounds on the right foot left lateral calf and left buttock 1/25; arrives in clinic with erythema and swelling of the left forefoot worse over the first MTP area. This extends  laterally dorsally and but also posteriorly. Still has an area on the left lateral part of the lower part of his calf wound it is eschared and clearly not closed. Area on the left buttock still with surrounding irritation and erythema. Right foot surface wound dorsally. The area between the right and first and second toes appears better. 2/1; The left foot wound is about the same. Erythema slightly better I gave him [name] week of doxycycline  empirically Right foot wound is more extensive extending between the toes to the plantar surface Left lateral calf really no open surface on the inferior part of his original wound however the entire area still looks vulnerable Absolutely no improvement in the left buttock wound required debridement. 2/8; the left foot is about the same. Erythema is slightly improved I gave him clindamycin  last week. Right foot looks better he is using Lotrimin and silver alginate He has [name] breakdown in the left lateral calf. Denuded epithelium which I have removed Left buttock about the same were using Hydrofera Blue 2/15; left foot is about the same there is less surrounding erythema. Surface still has tightly adherent debris which I have debriding however not making any progress Right foot has [name] substantial wound on the medial right second toe between the first and second webspace. Still an open area on the left lateral calf distal area. Buttock wound is about the same 2/22; left foot is about the same less surrounding erythema.  Surface has adherent debris. Polymen Ag Right foot area significant wound between the first and second toes. We have been using silver alginate here Left lateral leg polymen Ag at the base of his original venous insufficiency wound Left buttock some improvement here 3/1; Right foot is deteriorating in the first second toe webspace. Larger and more substantial. We have been using silver alginate. Left dorsal foot about the same markedly adherent surface debris using PolyMem Ag Left lateral calf surface debris using PolyMem AG Left buttock is improved again using PolyMem Ag. He is completing his terbinafine. The erythema in the foot seems better. He has been on this for 2 weeks 3/8; no improvement in any wound area in fact he has [name] small open area on the dorsal midfoot which is new this week. He has not gotten his foot x-rays yet 3/15; his x-rays were both negative for osteomyelitis of both feet. No major change in any of his wounds on the extremities however his buttock wounds are better. We have been using polymen on the buttocks, left lower leg. Iodoflex on the left foot and silver alginate on the right 3/22; arrives in clinic today with the 2 major issues are the improvement in the left dorsal foot wound which for once actually looks healthy with [name] nice healthy wound surface without debridement. Using Iodoflex here. Unfortunately on the left lateral calf which is in the distal part of his original wound he came to the clinic here for there was purulent drainage noted some increased breakdown scattered around the original area and [name] small area proximally. We we are using polymen here will change to silver alginate today. His buttock wound on the left is better and I think the area on the right first second toe webspace is also improved 3/29; left dorsal foot looks better. Using Iodoflex. Left ankle culture from deterioration last time grew [name] coli, Enterobacter and Enterococcus. I will give him  [name] course of cefdinir  although that will not cover Enterococcus. The area on the right foot in  the webspace of the first and second toe lateral first toe looks better. The area on his buttock is about healed Vascular appointment is on April 21. This is to look at his venous system vis--vis continued breakdown of the wounds on the left including the left lateral leg and left dorsal foot he. He has had previous ablations on this side 4/5; the area between the right first and second toes lateral aspect of the first toe looks better. Dorsal aspect of the left first toe on the left foot also improved. Unfortunately the left lateral lower leg is larger and there is [name] second satellite wound superiorly. The usual superficial abrasions on the left buttock overall better but certainly not closed 4/12; the area between the right first and second toes is improved. Dorsal aspect of the left foot also slightly smaller with [name] vibrant healthy looking surface. No real change in the left lateral leg and the left buttock wound is healed He has an unaffordable co-pay for Apligraf. Appointment with vein and vascular with regards to the left leg venous part of the circulation is on 4/21 4/19; we continue to see improvement in all wound areas. Although this is minor. He has his vascular appointment on 4/21. The area on the left buttock has not reopened although right in the center of this area the skin looks somewhat threatened 4/26; the left buttock is unfortunately reopened. In general his left dorsal foot has [name] healthy surface and looks somewhat smaller although it was not measured as such. The area between his first and second toe webspace on the right as [name] small wound against the first toe. The patient saw vascular surgery. The real question I was asking was about the small saphenous vein on the left. He has previously ablated left greater saphenous vein. Nothing further was commented on on the left. Right greater  saphenous vein without reflux at the saphenofemoral junction or proximal thigh there was no indication for ablation of the right greater saphenous vein duplex was negative for DVT bilaterally. They did not think there was anything from [name] vascular surgery point of view that could be offered. They ABIs within normal limits 5/3; only small open area on the left buttock. The area on the left lateral leg which was his original venous reflux is now 2 wounds both which look clean. We are using Iodoflex on the left dorsal foot which looks healthy and smaller. He is down to [name] very tiny area between the right first and second toes, using silver alginate 5/10; all of his wounds appear better. We have much better edema control in 4 layer compression on the left. This may be the factor that is allowing the left foot and left lateral calf to heal. He has external compression garments at home 04/14/20-All of his wounds are progressing well, the left forefoot is practically closed, left ischium appears to be about the same, right toe webspace is also smaller. The left lateral leg is about the same, continue using Hydrofera Blue to this, silver alginate to the ischium, Iodoflex to the toe space on the right 6/7; most of his wounds outside of the left buttock are doing well. The area on the left lateral calf and left dorsal foot are smaller. The area on the right foot in between the first and second toe webspace is barely visible although he still says there is some drainage here is the only reason I did not heal this out. Unfortunately the area on the  left buttock almost looks like he has [name] skin tear from tape. He has open wound and then [name] large flap of skin that we are trying to get adherence over an area just next to the remaining wound Agcaoili, Kyndal [name] ([id number]) [id number] Page 15 of 40 6/21; 2 week follow-up. I believe is been here for nurse visits. Miraculously the area between his  first and second toes on the left dorsal foot is closed over. Still open on the right first second web space. The left lateral calf has 2 open areas. Distally this is more superficial. The proximal area had [name] little more depth and required debridement of adherent necrotic material. His buttock wound is actually larger we have been using silver alginate here 6/28; the patient's area on the left foot remains closed. Still open wet area between the first and second toes on the right and also extending into the plantar aspect. We have been using silver alginate in this location. He has 2 areas on the left lower leg part of his original long wounds which I think are better. We have been using Hydrofera Blue here. Hydrofera Blue to the left buttock which is stable 7/12; left foot remains closed. Left ankle is closed. May be [name] small area between his right first and second toes the only truly open area is on the left buttock. We have been using Hydrofera Blue here 7/19; patient arrives with marked deterioration especially in the left foot and ankle. We did not put him in [name] compression wrap on the left last week in fact he wore his juxta lite stockings on either side although he does not have an underlying stocking. He has [name] reopening on the left dorsal foot, left lateral ankle and [name] new area on the right dorsal ankle. More worrisome is the degree of erythema on the left foot extending on the lateral foot into the lateral lower leg on the left 7/26; the patient had erythema and drainage from the lateral left ankle last week. Culture of this grew MRSA resistant to doxycycline  and clindamycin  which are the 2 antibiotics we usually use with this patient who has multiple antibiotic allergies including linezolid, trimethoprim sulfamethoxazole. I had give him an empiric doxycycline  and he comes in the area certainly looks somewhat better although it is blotchy in his lower leg. He has not been systemically unwell. He  has had areas on the left dorsal foot which is [name] reopening, chronic wounds on the left lateral ankle. Both of these I think are secondary to chronic venous insufficiency. The area between his first and second toes is closed as far as I can tell. He had [name] new wrap injury on the right dorsal ankle last week. Finally he has an area on the left buttock. We have been using silver alginate to everything except the left buttock we are using Hydrofera Blue 06/30/20-Patient returns at 1 week, has been given [name] sample dose pack of NUZYRA  which is [name] tetracycline derivative [omadacycline ], patient has completed those, we have been using silver alginate to almost all the wounds except the left ischium where we are using Hydrofera Blue all of them look better 8/16; since I last saw the patient he has been doing well. The area on the left buttock, left lateral ankle and left foot are all closed today. He has completed the Nuzyra  I gave him last time and tolerated this well. He still has open areas on the right dorsal ankle and in the right  first second toe area which we are using silver alginate. 8/23; we put him in his bilateral external compression stockings last week as he did not have anything open on either leg except for concerning area between the right first and second toe. He comes in today with an area on the left dorsal foot slightly more proximal than the original wound, the left lateral foot but this is actually [name] continuation of the area he had on the left lateral ankle from last time. As well he is opened up on the left buttock again. 8/30; comes in today with things looking [name] lot better. The area on the left lower ankle has closed down as has the left foot but with eschar in both areas. The area on the dorsal right ankle is also epithelialized. Very little remaining of the left buttock wound. We have been using silver alginate on all wound areas 9/13; the area in the first second toe webspace on the right  has fully epithelialized. He still has some vulnerable epithelium on the right and the ankle and the dorsal foot. He notes weeping. He is using his juxta lite stocking. On the left again the left dorsal foot is closed left lateral ankle is closed. We went to the juxta lite stocking here as well. Still vulnerable in the left buttock although only 2 small open areas remain here 9/27; 2-week follow-up. We did not look at his left leg but the patient says everything is closed. He is [name] bit disturbed by the amount of edema in his left foot he is using juxta lite stockings but asking about over the toes stockings which would be 30/40, will talk to him next time. According to him there is no open wound on either the left foot or the left ankle/calf He has an open area on the dorsal right calf which I initially point [name] wrap injury. He has superficial remaining wound on the left ischial tuberosity been using silver alginate although he says this sticks to the wound 10/5; we gave him 2-week follow-up but he called yesterday expressing some concerns about his right foot right ankle and the left buttock. He came in early. There is still no open areas on the left leg and that still in his juxta lite stocking 10/11; he only has 1 small area on the left buttock that remains measuring millimeters 1 mm. Still has the same irritated skin in this area. We recommended zinc oxide when this eventually closes and pressure relief is meticulously is he can do this. He still has an area on the dorsal part of his right first through third toes which is [name] bit irritated and still open and on the dorsal ankle near the crease of the ankle. We have been using silver alginate and using his own stocking. He has nothing open on the left leg or foot 10/25; 2-week follow-up. Not nearly as good on the left buttock as I was hoping. For open areas with 5 looking threatened small. He has the erythematous irritated chronic skin in this  area. 1 area on the right dorsal ankle. He reports this area bleeds easily Right dorsal foot just proximal to the base of his toes We have been using silver alginate. 11/8; 2-week follow-up. Left buttock is about the same although I do not think the wounds are in the same location we have been using silver alginate. I have asked him to use zinc oxide on the skin around the wounds. He still has  [name] small area on the right dorsal ankle he reports this bleeds easily Right dorsal foot just proximal to the base of the toes does not have anything open although the skin is very dry and scaly He has [name] new opening on the nailbed of the left great toe. Nothing on the left ankle 11/29; 3-week follow-up. Left buttock has 2 open areas. And washing of these wounds today started bleeding easily. Suggesting very friable tissue. We have been using silver alginate. Right dorsal ankle which I thought was initially [name] wrap injury we have been using silver alginate. Nothing open between the toes that I can see. He states the area on the left dorsal toe nailbed healed after the last visit in 2 or 3 days 12/13; 3-week follow-up. His left buttock now has 3 open areas but the original 2 areas are smaller using polymen here. Surrounding skin looks better. The right dorsal ankle is closed. He has [name] small opening on the right dorsal foot at the level of the third toe. In general the skin looks better here. He is wearing his juxta lite stocking on the left leg says there is nothing open 11/24/2020; 3 weeks follow-up. His left buttock still has the 3 open areas. We have been using polymen but due to lack of response he changed to Hydrofera Blue area. Surrounding skin is dry erythematous and irritated looking. There is no evidence of infection either bacterial or fungal however there is loss of surface epithelium He still has very dry skin in his foot causing irritation and erythema on the dorsal part of his toes. This is not  responded to prolonged courses of antifungal simply looks dry and irritated 1/24; left buttock area still looks about the same he was unable to find the triad ointment that we had suggested. The area on the right lower leg just above the dorsal ankle has reopened and the areas on the right foot between the first second and second third toes and scaling on the bottom of the foot has been about the same for quite some time now. been using silver alginate to all wound areas 2/7; left buttock wound looked quite good although not much smaller in terms of surface area surrounding skin looks better. Only [name] few dry flaking areas on the right foot in between the first and second toes the skin generally looks better here [ammonium lactate]. Finally the area on the right dorsal ankle is closed 2/21; There is no open area on the right foot even between the right first and second toe. Skin around this area dorsally and plantar aspects look better. He has [name] reopening of the area on the right ankle just above the crease of the ankle dorsally. I continue to think that this is probably friction from spasms may be even this time with his stocking under the compression stockings. Wounds on his left buttock look about the same there [name] couple of areas that have reopened. He has [name] total square area of loss of epithelialization. This does not look like infection it looks like [name] contact dermatitis but I just cannot determine to what 3/14; there is nothing on the right foot between the first and second toes this was carefully inspected under illumination. Some chronic irritation on the dorsal part of his foot from toes 1-3 at the base. Nothing really open here substantially. Still has an area on the right foot/ankle that is actually larger and hyper granulated. His buttock area on the left is  just about closed however he has chronic inflammation with loss of the surface epithelial layer 3/28; 2-week follow-up. In clinic  today with [name] new wound on the left anterior mid tibia. Says this happened about 2 weeks ago. He is not really sure how wonders about the spasticity of his legs at night whether that could have caused this other than that he does not have [name] good idea. He has been using topical antibiotics and silver alginate. The area on his right dorsal ankle seems somewhat better. Finally everything on his left buttock is closed. 4/11; 2-week follow-up. All of his wounds are better except for the area over the ischium and left buttock which have opened up widely again. At least part of this is covered in necrotic fibrinous material another part had rolled nonviable skin. The area on the right ankle, left anterior mid tibia are both [name] lot better. He had no open wounds on either foot including the areas between the first and second toes 4/25; patient presents for 2-week follow-up. He states that the wounds are overall stable. He has no complaints today and states he is using Hydrofera Blue to open wounds. 5/9; have not seen this man in over [name] month. For my memory he has open areas on the left mid tibia and right ankle. T oday he has new open area on the right Ahuja, Hoyte [name] ([id number]) [id number] Page 16 of 40 dorsal foot which we have not had [name] problem with recently. He has the sustained area on the left buttock He is also changed his insurance at the beginning of the year [employer]. We will need prior authorizations for debridement 5/23; patient presents for 2-week follow-up. He has prior authorizations for debridement. He denies any issues in the past 2 weeks with his wound care. He has been using Hydrofera Blue to all the wounds. He does report [name] circular rash to the upper left leg that is new. He denies acute signs of infection. 6/6; 2-week follow-up. The patient has open wounds on the left buttock which are worse than the last time I saw this about [name] month ago. He also has [name] new  area to me on the left anterior mid tibia with some surrounding erythema. The area on the dorsal ankle on the right is closed but I think this will be [name] friction injury every time this area is exposed to either our wraps or his compression stockings caused by unrelenting spasms in this leg. 6/20; 2-week follow-up. The patient has open wounds on the left buttock which is about the same. Using Hydrofera Blue here. - The left mid tibia has [name] static amount of surrounding erythema. Also [name] raised area in the center. We have been using Hydrofera Blue here. Finally he has broken down in his dorsal right foot extending between the first and second toes and going to the base of the first and second toe webspace. I have previously assumed that this was severe venous hypertension 7/5; 2-week follow-up The left buttock wound actually looks better. We are using Hydrofera Blue. He has extensive skin irritation around this area and I have not really been able to get that any better. I have tried Lotrisone i.[name] antifungals and steroids. More most recently we have just been using Coloplast really looks about the same. The left mid tibia which was new last week culture to have very resistant staph aureus. Not only methicillin-resistant but doxycycline  resistant. The patient has [name] plethora of antibiotic allergies including  sulfa, linezolid. I used topical bacitracin  on this but he has not started this yet. In addition he has an expanding area of erythema with [name] wound on the dorsal right foot. I did [name] deep tissue culture of this area today 7/12; Left buttock area actually looks better surrounding skin also looks less irritated. Left mid tibia looks about the same. He is using bacitracin  this is not worse Right dorsal foot looks about the same as well. The left first toe also looks about the same 7/19; left buttock wound continues to improve in terms of open areas Left mid tibia is still concerning amount of swelling  he is using bacitracin  Dorsal left first toe somewhat smaller Right dorsal foot somewhat smaller 7/25; left buttock wound actually continues to improve Left mid tibia area has less swelling. I gave him all my samples of new Nuzyra . This seems to have helped although the wound is still open it. His abrasion closed by here Left dorsal great toe really no better. Still [name] very nonviable surface Right dorsal foot perhaps some better. We have been using bacitracin  and silver nitrate to the areas on his lower legs and Hydrofera Blue to the area on the buttock. 8/16 Disappointed that his left buttock wound is actually more substantial. Apparently during the last nurse visit these were both very small. He has continued irritation to [name] large area of skin on his buttock. I have never been able to totally explain this although I think it some combination of the way he sits, pressure, moisture. He is not incontinent enough to contribute to this. Left dorsal great toe still fibrinous debris on the surface that I have debrided today Large area across the dorsal right toes. The area on the left anterior mid tibia has less swelling. He completed the Nuzyra . This does not look infected although the tissue is still fried 8/30; 2-week follow-up. Left buttock areas not improved. We used Hydrofera Blue on this. Weeping wet with the surrounding erythema that I have not been able to control even with Lotrisone and topical Coloplast Left dorsal great toe looks about the same More substantial area again at the base of his toes on the left which is new this week. Area across the dorsal right toes looks improved The left anterior mid tibia looks like it is trying to close 9/13; 2-week follow-up. Using silver alginate on all of his wounds. The left dorsal foot does not look any better. He has the area on the dorsal toe and also the areas at the base of all of his toes 1 through 3. On the right foot he has [name] similar  pattern in [name] similar area. He has the area on his left mid tibia that looks fairly healthy. Finally the area on his left buttock looks somewhat bette 9/20; culture I did of the left foot which was [name] deep tissue culture last time showed [name] coli he has erythema around this wound. Still [name] completely necrotic surface. His right dorsal foot looks about the same. He has [name] very friable surface to the left anterior mid tibia. Both buttock wounds look better. We have been using silver alginate to all wounds 10/4; he has completed the cephalexin  that I gave him last time for the left foot. He is using topical gentamicin under silver alginate silver alginate being applied to all the wounds. Unfortunately all the wounds look irritated on his dorsal right foot dorsal left foot left mid tibia. I wonder if this could  be [name] silver allergy. I am going to change him to Hydrofera Blue on the lower extremity. The skin on the left buttock and left posterior thigh still flaking dry and irritated. This has continued no matter what I have applied topically to this. He has [name] solitary open wound which by itself does not look too bad however the entire area of surrounding skin does not change no matter what we have applied here 10/18; the area on the left dorsal foot and right dorsal foot both look better. The area on the right extends into the plantar but not between his toes. We have been using silver alginate. He still has [name] rectangular erythematous area around the area on the left tibia. The wound itself is very small. Finally everything on his left buttock looks [name] little larger the skin is erythematous 11/15; patient comes in with the left dorsal and right dorsal foot distally looking somewhat better. Still nonviable surface on the left foot which required debridement. He still has the area on the left anterior mid tibia although this looks somewhat better. He has [name] new area on the right lateral lower leg just above the  ankle. Finally his left buttock looks terrible with multiple superficial open wounds geometric square shaped area of chronic erythema which I have not been able to sort through 11/29; right dorsal foot and left dorsal foot both look somewhat better. No debridement required. He has the fragile area on the left anterior mid tibia this looks and continues to look somewhat better. Right lateral lower leg just above the ankle we identified last time also looks better. In general the area on his left buttock looks improved. We are using Hydrofera Blue to all wound areas 12/13; right dorsal foot looks better. The area on the right lateral leg is healed. Left dorsal foot has 2 open areas both of which require debridement. The fragile area on the left anterior mid tibial looks better. Smaller area on his buttocks. Were using Hydrofera Blue 1/10; patient comes in with everything looking slightly larger and/or worse. This includes his left buttock, reopening of the left mid tibia, larger areas on the left dorsal foot and what looks to be [name] cellulitis on the right dorsal and plantar foot. We have been using Hydrofera Blue on all wounds. 1/17; right dorsal foot distally looks better today. The left foot has 2 open wounds that are about the same surrounding erythema. Culture I did last week showed rare Enterococcus and [name] multidrug resistant MRSA. 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 No malignancy they did not stain for fungus 1/24; his right distal foot is not closed dry and scaly but the wound looks like it is contracting. I did not debride anything here. Problem on his left dorsal foot with expanding erythema. Apparently there were problems last week getting the Nuzyra  however it is now available at the [facility] but [name] week later. He is using ketoconazole and Coloplast to the left buttock along with Hydrofera Blue this actually looks quite [name]  bit better today. 1/31; right dorsal foot again is dry and scaly but looking to contract. He has been using [name] moisturizer on his feet at my request but he is not sure which 1. The left dorsal foot wounds look about the same there is erythema here that I marked last week however after course of Nuzyra  it certainly is not any better but Taffe, Quindarrius [name] ([id number]) [id number] Page [phone]  of 40 not any worse either. Finally on his left buttock the skin continues to look better he has the original wound but [name] new substantial area towards the gluteal cleft. Almost like [name] skin tear. I used scissors to remove skin and subcutaneous tissue here silver nitrate and direct pressure 2/7; right dorsal foot. This does not look too much different from last week. Some erythema skin dry and scaly. No debridement. Left dorsal great toe again still not much improvement. I did remove flaking dry skin and callus from around the edge. Finally on his left buttock. The skin is somewhat better in the periwound. Surface wounds are superficial somewhat better than last week. 01/26/2022: Is [name] little bit of [name] mystery as to why his wounds fail to respond to treatments and actually seem to get worse. This is my first encounter with this patient. He was previously followed by Dr. Rufus. Based upon my review of the chart, it seems that there is [name] little bit of [name] mystery as to why his wounds do not respond as anticipated to the interventions applied and sometimes even get worse. Biopsies have been performed and he was seen by dermatology in [place], but that did not shed any light on the matter. T oday, his gluteal wound is larger, with substantial drainage, rather malodorous. The food wounds are not terrible, but he has [name] lot of callus and scaly skin around these. He is currently getting silver alginate on the gluteal wound, with idodoflex to the feet. He is using lotrisone on his legs for the dry, scaly  skin. 02/09/2022: There has really been no change to any of his wounds. The gluteal wound less drainage and odor, but remains about the same size, the periwound skin remains oddly scaly. His lower extremity wounds also appear roughly the same size. They continue to accumulate [name] small amount of slough. The periwound on his feet and ankle wounds has dry eschar and loose dead skin. We have been using silver alginate on the gluteal wound and Iodoflex on his feet and ankle wounds. T the periwound around his gluteal lesion and Lotrisone on his feet and legs. o 02/23/2022: The right plantar foot wound is closed. The gluteal site looks small but has continued to produce hypertrophic granulation tissue. The foot wounds all look about the same on the dorsal surface of the right foot; on the left, there is only [name] small open area at the site of where his left great toenail would have been. 03/16/2022: The right ankle wound is healed. The right dorsal foot wound is about the same. The left dorsal foot wound is quite [name] bit smaller and the ischial wound is nearly closed. 03/30/2022: The right ankle wound reopened. Both dorsal foot wounds are quite [name] bit smaller. Unfortunately, he appears to have sheared part of his ischial wound open further, perhaps during [name] transfer. 04/13/2022: The right ankle wound has hypertrophic granulation tissue present. The dorsal foot wounds continue to decrease in size. The ischial wound looks about the same today, no better, no worse. 04/27/2022: The right ankle wound has closed. Unfortunately, it looks like some moisture got underneath the dry skin on both of his dorsal feet and these wounds have expanded in size. The ischial wound remains the same with perhaps [name] little bit more slough accumulation than at our previous visit. 05/11/2022: The right ankle wound remains closed. There is [name] left anterior tibial wound that is small has patchy openings with accumulated slough. The dorsum  of his  right foot appears to be nearly healed with just [name] small punctate opening. The plantar surface of his right foot has [name] new opening that looks like he may have picked some skin there. His sacral ulcer has hypertrophic granulation tissue but has some slough accumulation. The dorsum of his left foot has multiple open areas in [name] fairly ragged distribution. All of these have slough accumulated within them. 06/01/2022: The right ankle and left anterior tibial wound are both closed. Dorsum of his right foot and left foot both look substantially better with just tiny scattered openings The without any slough accumulation. He has sheared open new areas on his left gluteus and ischium. He says that his wheelchair cushion, which is air-filled, has [name] leak and so it keeps deflating. He is awaiting [name] new cushion. 06/15/2022: The right ankle wound has reopened and the fat layer is exposed. Both dorsal feet have just small openings with just [name] little bit of slough and eschar accumulation. The wound on his left gluteus and ischium is larger again today and has [name] foul odor. 06/29/2022: The right ankle wound has hypertrophic granulation tissue buildup. His dorsal foot wounds both look better with just some eschar on the surface. He has [name] new wound on his left lateral ankle. He is not sure how he acquired it but by appearance, it looks that he hit it on something, potentially his wheelchair or bed. The ischial wound is about the same but is cleaner without any significant purulent drainage or odor. He did not understand what the [facility] call was about and therefore he does not have the topical compounded antibiotic. 07/13/2022: The right ankle wound again has hypertrophic granulation tissue, but less so than at his previous visit. The ischial wound has improved tremendously the use of the [facility] topical antibiotic. No significant change to the left lateral ankle wound; it is fibrotic with slough present. The skin of both of  his feet, especially on the right, has [name] yeasty appearance. 08/10/2022: There is again hypertrophic granulation tissue on the right anterior ankle wound. Both feet are about the same. The left lateral ankle wound is [name] little bit desiccated and has some slough buildup. He unfortunately suffered [name] new injury when he was removing his pants and they caught his bandage which caused [name] large skin tear on his left ischium, just distal to the existing wound. The existing left ischial wound, however, is significantly better with just [name] little light slough on the surface. 08/31/2022: The right anterior ankle wound is [name] lot smaller today underneath some eschar. No accumulation of hypertrophic granulation tissue. The left lateral ankle wound has some slough on the surface but is better in terms of moisture balance this week. The left dorsal foot does not really have any openings on it today. The right dorsal foot has some slough and eschar accumulation. His gluteal ulcer is basically closed aside from 2 small areas that are oozing [name] bit. 09/21/2022: The right anterior ankle wound is down to just [name] tiny pinhole. The left lateral ankle wound has accumulated slough again, but moisture balance is good. Left and right dorsal feet both have 2 small openings with some slough in them. The gluteal ulcer, unfortunately has opened up substantially. 10/05/2022: The right anterior ankle wound has reopened and has [name] fair amount of slough on the surface. The left lateral ankle wound has accumulated more slough, as well. He was approved for Apligraf, but the wound is not clean  enough yet. There is some slough buildup on the dorsal right foot wound, as well as on his ischium. 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 10/26/2022: The right anterior ankle wound has closed again. The left lateral ankle wound looks quite [name] bit better. It is ready for Apligraf but we do not have  1 here for him. His ischium continues to get worse, with the wound larger and deeper. It really seems like this is [name] combination of pressure and friction. He has 2 new wounds, 1 on the plantar aspect of each foot. He says that he had some dry skin there that peeled away. Both are superficial. The dorsum of his left foot does not have any new wound opening. The dorsum of his right foot has [name] bit of slough accumulation. 11/24/2022: The right anterior ankle wound has 2 small open areas, both covered with eschar. The left lateral ankle wound has closed and considerably with just [name] little slough on the surface. The ischium has improved most significantly, having closed in most of the open area with just [name] few small remaining superficial openings. The dorsal foot wounds are small and superficial. The bottoms of his feet are closed. 12/07/2022: The right anterior ankle wound remains open with 2 small areas, again with slough and eschar present. The left lateral ankle wound is down to about half [name] centimeter with just some slough on the surface. His right dorsal foot has opened up more and has both slough and eschar present. The ischium has continued to improve and now just has 2 small open areas that are very clean. 12/21/2022: He has [name] new wound on his right lateral ankle. There is some slough present. The left lateral ankle is quite [name] bit smaller with some slough buildup. His left ischial ulcer is also nearly closed; there is just [name] tear in his skin that seems likely to be secondary to [name] transfer mishap. He has [name] new ulcer in his natal cleft that looks like it may be secondary to moisture. There is some slough in this area as well. The right anterior ankle is nearly closed. The right dorsal foot wound has some slough accumulation 01/04/2023: The anterior right ankle wound is closed, as is his ischial ulcer. The left lateral ankle wound is nearly closed with just [name] little bit of slough Drakeford, Curran [name] ([id number])  [id number] Page 18 of 40 accumulation. The ulcer in his natal cleft is about the same size with slough buildup there, as well. He has [name] new wound on his left lateral leg near the knee from rubbing on his wheelchair. There is slough present, but no signs of infection. The right dorsal foot wound has [name] fairly heavy layer of slough buildup, as well. 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 He has been using his old wheelchair cushion for reasons that are unclear. The ulcer in his natal cleft has healed. The wound on his left lateral leg near the knee is smaller with some slough present. The right dorsal foot wound has accumulated slough again. The right lateral ankle wound is healed. The left lateral ankle wound is smaller again this week. 02/01/2023: His ischial ulcer has had [name] ton of drainage over the last 2 weeks and the drainage has an absolutely putrid odor. He has gone back to using his new wheelchair chair cushion, though. The wound on his left lateral leg near  his knee has healed. The left lateral ankle wound is nearly healed. Both the dorsal foot wounds are larger and have [name] lot of slough accumulation and drainage. The wound on his right anterior ankle is healed but there is [name] new 1 just proximal to this, also with slough and eschar present. 02/15/2023:The left lateral and right lateral ankle wounds are both smaller. The right anterior ankle wound has some eschar on the surface. Both dorsal feet have [name] lot of slough accumulation. 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 His new [facility] prescription should be delivered tomorrow. We are using silver alginate on everything at this point. 03/08/2023: Both dorsal foot wounds are about the same size and have [name] lot of slough buildup. The ischial ulcer has improved quite [name] bit. The odor has abated and the surfaces  are clean. It is still quite friable and 1 area is oozing. The left lateral and right lateral ankle wounds are nearly closed. The right anterior ankle wound is healed. 03/22/2023: The right lateral ankle wound is healed. The left lateral ankle wound is tiny with just [name] little bit of slough accumulation. The dorsal foot wounds look about the same. The ischial ulcer is considerably smaller and there is no odor. There is slight slough accumulation. 04/14/2023: The right lateral ankle wound has reopened. It is fairly superficial but does have some hematoma in the tissue suggesting trauma or pressure as the etiology. The left lateral ankle wound is essentially [name] pinhole under some dry eschar. The dorsal foot wounds are stable. The ischial ulcer, unfortunately, has expanded to 5 separate openings in his skin. The wounds are clean but friable. 04/28/2023: The left lateral ankle wound has expanded and is [name] little bit deeper today. Dorsal foot wounds are stable. The ischial ulcer is less friable and does not have the strong odor that has been present. The right lateral ankle wound is [name] little bit smaller with some slough and eschar accumulation. 05/10/2023: The total area of the ischial wound is smaller. The left lateral ankle wound is nearly closed. Both dorsal foot wounds are quite [name] bit smaller. The right ankle wound has healed. 05/31/2023: The left lateral leg wound is down to just [name] superficial small area that almost looks like [name] scrape rather than the deeper ulcer it has been. His ischial ulcer is down to just 1 open spot. It is clean but quite friable. Both dorsal foot ulcers are considerably smaller and have much less slough than usual. 06/21/2023: His ischial ulcer is still just 1 open area, but it is [name] little larger today. The surrounding skin, however, is in better condition Unfortunately, the left lateral leg wound is [name] little bit larger today. It looks as though some dry skin was peeled away. Both dorsal  foot ulcers continue to contract; they are the smallest I have ever seen them and have only minimal slough on the surface. 07/12/2023: All of his wounds have deteriorated and his right anterior ankle ulcer is open again. He has had [name] lot more drainage, although he states it is clear and does not have any significant odor. This is resulted in breakdown around the left lateral ankle ulcer, both dorsal feet and has extended to the plantar surface of both feet. His ischial ulcer is also larger; this appears to be more due to shear than anything else. 08/02/2023: All of his wounds look better. There  does not appear to be any rhyme or reason as to why they wax and wane as they do. 08/23/2023: The right dorsal ankle wound is covered with [name] extremely thin layer of skin but is still purple underneath. The left lateral ankle wound has quite [name] bit of slough on it and it appears that the tendon is exposed again. His ischial ulcer is down to 4 open areas, all of which are smaller than previously, but the surfaces remain friable and have some slough on them today. The dorsal foot wounds are both considerably smaller and the right plantar ulcer is closed. The left plantar ulcer is nearly closed. 09/26/2023: The right dorsal ankle wound is basically healed. The left lateral ankle wound is smaller. His ischial ulcers have merged and are [name] bit larger today. Both dorsal foot wounds are stable. The left plantar ulcer is closed but the right plantar ulcer has reopened. 12/3; the patient comes in today for what I gather is monthly follow-up. Unfortunately things are are not going very well. He has multiple wounds including on his right dorsal foot left dorsal foot left lateral lower extremity left posterior lower extremity left buttock and right anterior lower extremity. 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 He has  been using mupirocin  silver alginate to all the wounds and his own compression stockings in his legs. I am not sure how much he keeps his legs elevated but he has been coached on this before 11/07/2023: Apparently the buttock area looks better today than it did at his previous visit. There is no necrosis present. It is still larger than when I saw it in November. The left lateral ankle wound is larger and appears deeper under [name] thick layer of slough. Both feet have deteriorated and the wounds are larger with slough build up. The right anterior ankle wound is open again. 11/22/2023: All of the wounds are looking better today. The lateral left leg wound and left plantar foot wounds have epithelialized significantly. There are fewer open areas on the ischium and these all look healthier. The dorsal foot wounds and left ankle wound have their usual accumulation of slough. Electronic Signature(s) Signed: 11/22/2023 10:09:16 AM By: [name] Nest MD FACS Entered By: [name] Nest on 11/22/2023 06:20:44 -------------------------------------------------------------------------------- Physical Exam Details Patient Name: Date of Service: Hartwig, [name] [name] 11/22/2023 7:45 [name] [name] Medical Record Number: [id number] Patient Account Number: [id number] Date of Birth/Sex: Treating RN: [birth year] (37 y.o. [name]) Primary Care Provider: [name], [name] Other Clinician: Referring Provider: Treating Provider/Extender: [name] Nest BRIDGE, [name] [name]: 411 Bi, Rayman [name] ([id number]) [id number] Page 19 of 40 Constitutional Slightly hypertensive. . . no acute distress. Respiratory Normal work of breathing on room air.. Notes 11/22/2023: All of the wounds are looking better today. The lateral left leg wound and left plantar foot wounds have epithelialized significantly. There are fewer open areas on the ischium and these all look healthier. The dorsal foot wounds and left  ankle wound have their usual accumulation of slough. Electronic Signature(s) Signed: 11/22/2023 10:09:16 AM By: [name] Nest MD FACS Entered By: [name] Nest on 11/22/2023 06:22:26 -------------------------------------------------------------------------------- Physician Orders Details Patient Name: Date of Service: Brendle, [name] [name] 11/22/2023 7:45 [name] [name] Medical Record Number: [id number] Patient Account Number: [id number] Date of Birth/Sex: Treating RN: 04/18/1986 (37 y.o. 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 - Dr. Marolyn ***allow extra time*** Return [name] Anesthetic Wound #52 Right,Dorsal Foot (In clinic) Topical Lidocaine  4% applied to wound bed Bathing/ Shower/ Hygiene May shower and wash wound with soap and water. - on days that dressing is changed Off-Loading Roho cushion for wheelchair - use newer cushion Turn and reposition every 2 hours - lift up with arms in wheelchair every hour during the day Wound Treatment Wound #52 - Foot Wound Laterality: Dorsal, Right Cleanser: Soap and Water Every Other Day/30 Days Discharge Instructions: May shower and wash wound with dial antibacterial soap and water prior to dressing change. Cleanser: Wound  Cleanser Every Other Day/30 Days Discharge Instructions: Cleanse the wound with wound cleanser prior to applying [name] clean dressing using gauze sponges, not tissue or cotton balls. Peri-Wound Care: Sween Lotion (Moisturizing lotion) Every Other Day/30 Days Discharge Instructions: Apply Aquaphor moisturizing lotion as directed Topical: Mupirocin  Ointment Every Other Day/30 Days Discharge Instructions: Apply Mupirocin  (Bactroban ) as instructed Herman, Roshan [name] ([id number]) [id number] Page 20 of 40 Prim Dressing: Maxorb Extra Ag+ Alginate Dressing, 2x2 (in/in) Every Other Day/30 Days ary Discharge Instructions: Apply to wound bed as instructed Secondary Dressing: Woven Gauze Sponge, Non-Sterile 4x4 in Every Other Day/30 Days Discharge Instructions: Apply over primary dressing as directed. Secured With: [employer], 4.5x3.1 (in/yd) (Generic) Every Other Day/30 Days Discharge Instructions: Secure with Kerlix as directed. Secured With: 635M Medipore H Soft Cloth Surgical T ape, 4 x 10 (in/yd) (Generic) Every Other Day/30 Days Discharge Instructions: Secure with tape as directed. Wound #56 - Foot Wound Laterality: Dorsal, Left Cleanser: Soap and Water Every Other Day/30 Days Discharge Instructions: May shower and wash wound with dial antibacterial soap and water prior to dressing change. Cleanser: Wound Cleanser Every Other Day/30 Days Discharge Instructions: Cleanse the wound with wound cleanser prior to applying [name] clean dressing using gauze sponges, not tissue or cotton balls. Peri-Wound Care: Sween Lotion (Moisturizing lotion) Every Other Day/30 Days Discharge Instructions: Apply Aquaphor moisturizing lotion as directed Topical: Mupirocin  Ointment Every Other Day/30 Days Discharge Instructions: Apply Mupirocin  (Bactroban ) as instructed Prim Dressing: Maxorb Extra Ag+ Alginate Dressing, 2x2 (in/in) Every Other Day/30 Days ary Discharge Instructions: Apply to wound  bed as instructed Secondary Dressing: Woven Gauze Sponge, Non-Sterile 4x4 in Every Other Day/30 Days Discharge Instructions: Apply over primary dressing as directed. Secured With: [employer], 4.5x3.1 (in/yd) (Generic) Every Other Day/30 Days Discharge Instructions: Secure with Kerlix as directed. Secured With: 635M Medipore H Soft Cloth Surgical T ape, 4 x 10 (in/yd) (Generic) Every Other Day/30 Days Discharge Instructions: Secure with tape as directed. Wound #67 - Lower Leg Wound Laterality: Left, Lateral Cleanser: Soap and Water Every Other Day/30 Days Discharge Instructions: May shower and wash wound with dial antibacterial soap and water prior to dressing change. Cleanser: Wound Cleanser Every Other Day/30 Days Discharge Instructions: Cleanse the wound with wound cleanser prior to applying [name] clean  dressing using gauze sponges, not tissue or cotton balls. Peri-Wound Care: Sween Lotion (Moisturizing lotion) Every Other Day/30 Days Discharge Instructions: Apply Aquaphor moisturizing lotion as directed Topical: Mupirocin  Ointment Every Other Day/30 Days Discharge Instructions: Apply Mupirocin  (Bactroban ) as instructed Prim Dressing: Maxorb Extra Ag+ Alginate Dressing, 2x2 (in/in) Every Other Day/30 Days ary Discharge Instructions: Apply to wound bed as instructed Secondary Dressing: Woven Gauze Sponge, Non-Sterile 4x4 in Every Other Day/30 Days Discharge Instructions: Apply over primary dressing as directed. Secured With: [employer], 4.5x3.1 (in/yd) (Generic) Every Other Day/30 Days Discharge Instructions: Secure with Kerlix as directed. Secured With: 17M Medipore H Soft Cloth Surgical T ape, 4 x 10 (in/yd) (Generic) Every Other Day/30 Days Discharge Instructions: Secure with tape as directed. Wound #74 - Upper Leg Wound Laterality: Left, Posterior Cleanser: Vashe 5.8 (oz) 1 x Per Day/30 Days Discharge Instructions: Cleanse the wound with Vashe prior to applying [name] clean  dressing using gauze sponges , let sit on wound for 10 minutes Peri-Wound Care: Zinc Oxide Ointment 30g tube 1 x Per Day/30 Days Discharge Instructions: Apply Zinc Oxide to periwound with each dressing change as needed fpr moisture Topical: Mupirocin  Ointment 1 x Per Day/30 Days Discharge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 doxycycline  for UTI through his primary doctor. The erythema is absolutely no better there is warmth and swelling both medially from the left lateral leg wound and also the dorsal left foot. 01/06/17- Patient is here for follow-up evaluation of his left lateral leg ulcer and bilateral feet ulcers. He is on oral antibiotic therapy, tolerating that. Nursing staff and the patient states that the erythema is improved from Monday. 01/13/17; the predominant left lateral leg wound continues to be problematic. I had put Apligraf on him earlier this month once. However he subsequently developed what appeared to be an intense cellulitis around the left lateral leg wound. I gave him Dalvance  I think on 2/12 perhaps 2/13 he continues on cefdinir . The erythema is still present but the warmth and swelling is improved. I am hopeful that the cellulitis part of this control. I wouldn't be surprised if there is an element of venous inflammation as well. 01/17/17. The erythema is present but better in the left leg. His left lateral leg wound still does not have [name] viable surface buttons certain parts of this long thin wound it appears like there has been improvement in  dimensions. 01/20/17; the erythema still present but much better in the left leg. I'[name] thinking this is his usual degree of chronic venous inflammation. The  wound on the left leg looks somewhat better. Is less surface slough 01/27/17; erythema is back to the chronic venous inflammation. The wound on the left leg is somewhat better. I am back to the point where I like to try an Apligraf once again 02/10/17; slight improvement in wound dimensions. Apligraf #2. He is completing his doxycycline  02/14/17; patient arrives today having completed doxycycline  last Thursday. This was supposed to be [name] nurse visit however once again he hasn't tense erythema from the medial part of his wound extending over the lower leg. Also erythema in his foot this is roughly in the same distribution as last time. He has baseline chronic venous inflammation however this is [name] lot worse than the baseline I have learned to accept the on him is baseline inflammation 02/24/17- patient is here for follow-up evaluation. He is tolerating compression therapy. His voicing no complaints or concerns he is here anticipating an Apligraf 03/03/17; he arrives today with an adherent necrotic surface. I don't think this is surface is going to be amenable for Apligraf's. The erythema around his wound and on the left dorsal foot has resolved he is off antibiotics 03/10/17; better-looking surface today. I don't think he can tolerate Apligraf's. He tells me he had [name] wound VAC after [name] skin graft years ago to this area and they had difficulty with [name] seal. The erythema continues to be stable around this some degree of chronic venous inflammation but he also has recurrent cellulitis. We have been using Iodoflex 03/17/17; continued improvement in the surface and may be small changes in dimensions. Using Iodoflex which seems the only thing that will control his surface 03/24/17- He is here for follow up evaluation of his LLE lateral ulceration and ulcer to right dorsal foot/toe space. He is voicing no complaints or concerns, He is tolerating compression wrap. 03/31/17 arrives today with [name] much healthier looking wound  on the left lower extremity. We have been using Iodoflex for [name] prolonged period of time which has for the first time prepared and adequate looking wound bed although we have not had much in the way of wound dimension improvement. He also has [name] small Heeren, Kym [name] ([id number]) [id number].pdf Page 26 of 40 wound between the first and second toe on the right 04/07/17; arrives today with [name] healthy-looking wound bed and at least the top 50% of this wound appears to be now her. No debridement was required I have changed him to Hydrofera Blue last week after prolonged Iodoflex. He did not do well with Apligraf's. We've had [name] re-opening between the first and second toe on the right 04/14/17; arrives today with [name] healthier looking wound bed contractions and the top 50% of this wound and some on the lesser 50%. Wound bed appears healthy. The area between the first and second toe on the right still remains problematic 04/21/17; continued very gradual improvement. Using Hydrofera Blue 04/28/17; continued very gradual improvement in the left lateral leg venous insufficiency wound. His periwound erythema is very mild. We have been using Hydrofera Blue. Wound is making progress especially in the superior 50% 05/05/17; he continues to have very gradual improvement in the left lateral venous insufficiency wound. Both in terms with an length rings are improving. I debrided this every 2 weeks with #5 curet and we have been using Hydrofera Blue and again making  good progress With regards to the wounds between his right first and second toe which I thought might of been tinea pedis he is not making as much progress very dry scaly skin over the area. Also the area at the base of the left first and second toe in [name] similar condition 05/12/17; continued gradual improvement in the refractory left lateral venous insufficiency wound on the left. Dimension smaller. Surface still requiring debridement using  Hydrofera Blue 05/19/17; continued gradual improvement in the refractory left lateral venous ulceration. Careful inspection of the wound bed underlying rumination suggested some degree of epithelialization over the surface no debridement indicated. Continue Hydrofera Blue difficult areas between his toes first and third on the left than first and second on the right. I'[name] going to change to silver alginate from silver collagen. Continue ketoconazole as I suspect underlying tinea pedis 05/26/17; left lateral leg venous insufficiency wound. We've been using Hydrofera Blue. I believe that there is expanding epithelialization over the surface of the wound albeit not coming from the wound circumference. This is [name] bit of an odd situation in which the epithelialization seems to be coming from the surface of the wound rather than in the exact circumference. There is still small open areas mostly along the lateral margin of the wound. He has unchanged areas between the left first and second and the right first second toes which I been treating for tenia pedis 06/02/17; left lateral leg venous insufficiency wound. We have been using Hydrofera Blue. Somewhat smaller from the wound circumference. The surface of the wound remains [name] bit on it almost epithelialized sedation in appearance. I use an open curette today debridement in the surface of all of this especially the edges Small open wounds remaining on the dorsal right first and second toe interspace and the plantar left first second toe and her face on the left 06/09/17; wound on the left lateral leg continues to be smaller but very gradual and very dry surface using Hydrofera Blue 06/16/17 requires weekly debridements now on the left lateral leg although this continues to contract. I changed to silver collagen last week because of dryness of the wound bed. Using Iodoflex to the areas on his first and second toes/web space bilaterally 06/24/17; patient with history of  paraplegia also chronic venous insufficiency with lymphedema. Has [name] very difficult wound on the left lateral leg. This has been gradually reducing in terms of with but comes in with [name] very dry adherent surface. High switch to silver collagen [name] week or so ago with hydrogel to keep the area moist. This is been refractory to multiple dressing attempts. He also has areas in his first and second toes bilaterally in the anterior and posterior web space. I had been using Iodoflex here after [name] prolonged course of silver alginate with ketoconazole was ineffective [question tinea pedis] 07/14/17; patient arrives today with [name] very difficult adherent material over his left lateral lower leg wound. He also has surrounding erythema and poorly controlled edema. He was switched his Santyl  last visit which the nurses are applying once during his doctor visit and once on [name] nurse visit. He was also reduced to 2 layer compression I'[name] not exactly sure of the issue here. 07/21/17; better surface today after 1 week of Iodoflex. Significant cellulitis that we treated last week also better. [Doxycycline ] 07/28/17 better surface today with now 2 weeks of Iodoflex. Significant cellulitis treated with doxycycline . He has now completed the doxycycline  and he is back to his usual  degree of chronic venous inflammation/stasis dermatitis. He reminds me he has had ablations surgery here 08/04/17; continued improvement with Iodoflex to the left lateral leg wound in terms of the surface of the wound although the dimensions are better. 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 Problematic areas on the plantar aspect of the first second toe web space on the left and the dorsal aspect of the first second toe web space on the right. At one point I felt these were probably related to chronic fungal infections in treated him aggressively for this although we have not made any  improvement here. 08/11/17; left lateral leg. Surface continues to improve with the Iodoflex although we are not seeing much improvement in overall wound dimensions. Areas on his plantar left foot and right foot show no improvement. In fact the right foot looks somewhat worse 08/18/17; left lateral leg. We changed to Hydrofera Blue last week after [name] prolonged course of Iodoflex which helps get the surface better. It appears that the wound with is improved. Continue with difficult areas on the left dorsal first second and plantar first second on the right 09/01/17; patient arrives in clinic today having had [name] temperature of 103 yesterday. He was seen in the ER and [facility]. The patient was concerned he could have cellulitis again in the right leg however they diagnosed him with [name] UTI and he is now on Keflex . He has [name] history of cellulitis which is been recurrent and difficult but this is been in the left leg, in the past 5 use doxycycline . He does in and out catheterizations at home which are risk factors for UTI 09/08/17; patient will be completing his Keflex  this weekend. The erythema on the left leg is considerably better. He has [name] new wound today on the medial part of the right leg small superficial almost looks like [name] skin tear. He has worsening of the area on the right dorsal first and second toe. His major area on the left lateral leg is better. Using Hydrofera Blue on all areas 09/15/17; gradual reduction in width on the long wound in the left lateral leg. No debridement required. He also has wounds on the plantar aspect of his left first second toe web space and on the dorsal aspect of the right first second toe web space. 09/22/17; there continues to be very gradual improvements in the dimensions of the left lateral leg wound. He hasn't round erythematous spot with might be pressure on his wheelchair. There is no evidence obviously of infection no purulence no warmth He has [name] dry scaled area on  the plantar aspect of the left first second toe Improved area on the dorsal right first second toe. 09/29/17; left lateral leg wound continues to improve in dimensions mostly with an is still [name] fairly long but increasingly narrow wound. He has [name] dry scaled area on the plantar aspect of his left first second toe web space Increasingly concerning area on the dorsal right first second toe. In fact I am concerned today about possible cellulitis around this wound. The areas extending up his second toe and although there is deformities here almost appears to abut on the nailbed. 10/06/17; left lateral leg wound continues to make very gradual progress. Tissue culture I did from the right first second toe dorsal foot last time grew MRSA and enterococcus which was vancomycin  sensitive. This was not sensitive to clindamycin  or doxycycline . He is allergic to  Zyvox and sulfa we have therefore arrange for him to have dalvance  infusion tomorrow. He is had this in the past and tolerated it well 10/20/17; left lateral leg wound continues to make decent progress. This is certainly reduced in terms of with there is advancing epithelialization.The cellulitis in the right foot looks better although he still has [name] deep wound in the dorsal aspect of the first second toe web space. Plantar left first toe web space on the left I think is making some progress 10/27/17; left lateral leg wound continues to make decent progress. Advancing epithelialization.using Hydrofera Blue The right first second toe web space wound is better-looking using silver alginate Improvement in the left plantar first second toe web space. Again using silver alginate 11/03/17 left lateral leg wound continues to make decent progress albeit slowly. Using Hydrofera Blue The right per second toe web space continues to be [name] very problematic looking punched out wound. I obtained [name] piece of tissue for deep culture I did extensively treated this for fungus.  It is difficult to imagine that this is [name] pressure area as the patient states other than going outside he doesn't really wear shoes at home The left plantar first second toe web space looked fairly senescent. Necrotic edges. This required debridement change to [facility] Blue to all wound areas 11/10/17; left lateral leg wound continues to contract. Using Hydrofera Blue On the right dorsal first second toe web space dorsally. Culture I did of this area last week grew MRSA there is not an easy oral option in this patient was multiple antibiotic allergies or intolerances. This was only [name] rare culture isolate I'[name] therefore going to use Bactroban  under silver alginate On the left plantar first second toe web space. Debridement is required here. This is also unchanged 11/17/17; left lateral leg wound continues to contract using Hydrofera Blue this is no longer the major issue. The major concern here is the right first second toe web space. He now has an open area going from dorsally to the plantar aspect. There is now wound on the inner lateral part of the first toe. Not [name] very viable surface on this. There is erythema spreading medially into the forefoot. No major change in the left first second toe plantar wound 11/24/17; left lateral leg wound continues to contract using Hydrofera Blue. Nice improvement today The right first second toe web space all of this looks [name] lot less angry than last week. I have given him clindamycin  and topical Bactroban  for MRSA and terbinafine for the possibility of underlining tinea pedis that I could not control with ketoconazole. Looks somewhat better The area on the plantar left first second toe web space is weeping with dried debris around the wound 12/01/17; left lateral leg wound continues to contract he Hydrofera Blue. It is becoming thinner in terms of with nevertheless it is making good improvement. The right first second toe web space looks less angry but still [name]  large necrotic-looking wounds starting on the plantar aspect of the right foot extending Gowan, Rayon [name] ([id number]) [id number] Page 27 of 40 between the toes and now extensively on the base of the right second toe. I gave him clindamycin  and topical Bactroban  for MRSA anterior benefiting for the possibility of underlying tinea pedis. Not looking better today The area on the left first/second toe looks better. Debrided of necrotic debris 12/05/17* the patient was worked in urgently today because over the weekend he found blood on his incontinence bad when he  woke up. He was found to have an ulcer by his wife who does most of his wound care. He came in today for us  to look at this. He has not had [name] history of wounds in his buttocks in spite of his paraplegia. 12/08/17; seen in follow-up today at his usual appointment. He was seen earlier this week and found to have [name] new wound on his buttock. We also follow him for wounds on the left lateral leg, left first second toe web space and right first second toe web space 12/15/17; we have been using Hydrofera Blue to the left lateral leg which has improved. The right first second toe web space has also improved. Left first second toe web space plantar aspect looks stable. The left buttock has worsened using Santyl . Apparently the buttock has drainage 12/22/17; we have been using Hydrofera Blue to the left lateral leg which continues to improve now 2 small wounds separated by normal skin. He tells us  he had [name] fever up to 100 yesterday he is prone to UTIs but has not noted anything different. He does in and out catheterizations. The area between the first and second toes today does not look good necrotic surface covered with what looks to be purulent drainage and erythema extending into the third toe. I had gotten this to something that I thought look better last time however it is not look good today. He also has [name] necrotic surface over  the buttock wound which is expanded. I thought there might be infection under here so I removed [name] lot of the surface with [name] #5 curet though nothing look like it really needed culturing. He is been using Santyl  to this area 12/27/17; his original wound on the left lateral leg continues to improve using Hydrofera Blue. I gave him samples of Baxdella although he was unable to take them out of fear for an allergic reaction [lump in his throat].the culture I did of the purulent drainage from his second toe last week showed both enterococcus and [name] set Enterobacter I was also concerned about the erythema on the bottom of his foot although paradoxically although this looks somewhat better today. 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 We've been using silver alginate here. They came up today that he sleeps in [name] recliner, I'[name] not sure why but I asked him to stop this 01/03/18; his original wound we've been using Hydrofera Blue is now separated into 2 areas. Ulcer on his left buttock is better he is off the recliner and sleeping in bed Finally both wound areas between his first and second toes also looks some better 01/10/18; his original wound on the left lateral leg is now separated into 2 wounds we've been using Hydrofera Blue Ulcer on his left buttock has some drainage. There is [name] small probing site going into muscle layer superiorly.using silver alginate -He arrives today with [name] deep tissue injury on the left heel The wound on the dorsal aspect of his first second toe on the left looks [name] lot betterusing silver alginate ketoconazole The area on the first second toe web space on the right also looks [name] lot bette 01/17/18; his original wound on the left lateral leg continues to progress using Hydrofera Blue Ulcer on his left buttock also is smaller surface healthier except for [name] small probing site going into the  muscle layer superiorly. 2.4 cm of tunneling  in this area DTI on his left heel we have only been offloading. Looks better than last week no threatened open no evidence of infection the wound on the dorsal aspect of the first second toe on the left continues to look like it's regressing we have only been using silver alginate and terbinafine orally The area in the first second toe web space on the right also looks to be [name] lot better using silver alginate and terbinafine I think this was prompted by tinea pedis 01/31/18; the patient was hospitalized in [place] last week apparently for [name] complicated UTI. He was discharged on cefepime  he does in and out catheterizations. In the hospital he was discovered [name] I don't mild elevation of AST and ALT and the terbinafine was stopped.predictably the pressure ulcer on s his buttock looks betterusing silver alginate. The area on the left lateral leg also is better using Hydrofera Blue. The area between the first and second toes on the left better. First and second toes on the right still substantial but better. Finally the DTI on the left heel has held together and looks like it's resolving 02/07/18-he is here in follow-up evaluation for multiple ulcerations. He has new injury to the lateral aspect of the last issue [name] pressure ulcer, he states this is from adhesive removal trauma. He states he has tried multiple adhesive products with no success. All other ulcers appear stable. The left heel DTI is resolving. We will continue with same treatment plan and follow-up next week. 02/14/18; follow-up for multiple areas. He has [name] new area last week on the lateral aspect of his pressure ulcer more over the posterior trochanter. The original pressure ulcer looks quite stable has healthy granulation. 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 Almost fully epithelialized on  the original superior area using Hydrofera Blue DTI on the left heel has peeled off this week to reveal [name] small superficial wound under denuded skin and subcutaneous tissue Both areas between the first and second toes look better including nothing open on the left 02/21/18; The patient's wounds on his left ischial tuberosity and posterior left greater trochanter actually looked better. He has [name] large area of irritation around the area which I think is contact dermatitis. I am doubtful that this is fungal His original wound on the left lateral calf continues to improve we have been using Hydrofera Blue There is no open area in the left first second toe web space although there is [name] lot of thick callus The DTI on the left heel required debridement today of necrotic surface eschar and subcutaneous tissue using silver alginate Finally the area on the right first second toe webspace continues to contract using silver alginate and ketoconazole 02/28/18 Left ischial tuberosity wounds look better using silver alginate. Original wound on the left calf only has one small open area left using Hydrofera Blue DTI on the left heel required debridement mostly removing skin from around this wound surface. Using silver alginate The areas on the right first/second toe web space using silver alginate and ketoconazole 03/08/18 on evaluation today patient appears to be doing decently well as best I can tell in regard to his wounds. This is the first time that I have seen him as he generally is followed by Dr. Rufus. With that being said none of his wounds appear to be infected he does have an area where there is some  skin covering what appears to be [name] new wound on the left dorsal surface of his great toe. This is right at the nail bed. With that being said I do believe that debrided away some of the excess skin can be of benefit in this regard. Otherwise he has been tolerating the dressing changes without  complication. 03/14/18; patient arrives today with the multiplicity of wounds that we are following. He has not been systemically unwell Original wound on the left lateral calf now only has 2 small open areas we've been using Hydrofera Blue which should continue The deep tissue injury on the left heel requires debridement today. We've been using silver alginate The left first second toe and the right first second toe are both are reminiscence what I think was tinea pedis. Apparently some of the callus Surface between the toes was removed last week when it started draining. Purulent drainage coming from the wound on the ischial tuberosity on the left. 03/21/18-He is here in follow-up evaluation for multiple wounds. 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 He tolerated debridement. The only change to last week's recommendations is to discontinue antifungal cream between toes. He will follow-up next week 03/28/18; following up for multiple wounds;Concern this week is streaking redness and swelling in the right foot. He is going to need antibiotics for this. 03/31/18; follow-up for right foot cellulitis. Streaking redness and swelling in the right foot on 03/28/18. 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 I put him on clindamycin  300 mg every 6 and brought him in for [name] quick check. He has an open wound between his first and second toes on the right foot as [name] potential source. 04/04/18; Right foot cellulitis is resolving he is completing clindamycin . This is truly good news Left lateral calf wound which is initial wound only has one small open area inferiorly this is close to healing out. He has compression stockings. We will use Hydrofera Blue right down to the epithelialization of this Nonviable surface on the left heel which was initially pressure with [name] DTI. We've been using Hydrofera Blue. I'[name] going to  switch this back to silver alginate Krone, Kassem [name] ([id number]) [id number] Page 28 of 40 Left first second toe/tinea pedis this looks better using silver alginate Right first second toe tinea pedis using silver alginate Large pressure ulcers on theLeft ischial tuberosity. Small wound here Looks better. I am uncertain about the surface over the large wound. Using silver alginate 04/11/18; Cellulitis in the right foot is resolved Left lateral calf wound which was his original wounds still has 2 tiny open areas remaining this is just about closed Nonviable surface on the left heel is better but still requires debridement Left first second toe/tinea pedis still open using silver alginate Right first second toe wound tinea pedis I asked him to go back to using ketoconazole and silver alginate Large pressure ulcers on the left ischial tuberosity this shear injury here is resolved. Wound is smaller. No evidence of infection using silver alginate 04/18/18; Patient arrives with an intense area of cellulitis in the right mid lower calf extending into the right heel area. Bright red and warm. Smaller area on the left anterior leg. He has [name] significant history of MRSA. He will definitely need antibioticsdoxycycline He now has 2 open areas on the left ischial tuberosity the original large wound and now [name] satellite area which I think was above his initial satellite  areas. Not [name] wonderful surface on this satellite area surrounding erythema which looks like pressure related. His left lateral calf wound again his original wound is just about closed Left heel pressure injury still requiring debridement Left first second toe looks [name] lot better using silver alginate Right first second toe also using silver alginate and ketoconazole cream also looks better 04/20/18; the patient was worked in early today out of concerns with his cellulitis on the right leg. I had started him on doxycycline .  This was 2 days ago. His wife was concerned about the swelling in the area. Also concerned about the left buttock. He has not been systemically unwell no fever chills. No nausea vomiting or diarrhea 04/25/18; the patient's left buttock wound is continued to deteriorate he is using Hydrofera Blue. He is still completing clindamycin  for the cellulitis on the right leg although all of this looks better. 05/02/18 Left buttock wound still with [name] lot of drainage and [name] very tightly adherent fibrinous necrotic surface. He has [name] deeper area superiorly The left lateral calf wound is still closed DTI wound on the left heel necrotic surface especially the circumference using Iodoflex Areas between his left first second toe and right first second toe both look better. Dorsally and the right first second toe he had [name] necrotic surface although at smaller. In using silver alginate and ketoconazole. I did [name] culture last week which was [name] deep tissue culture of the reminiscence of the open wound on the right first second toe dorsally. This grew [name] few Acinetobacter and [name] few methicillin-resistant staph aureus. Nevertheless the area actually this week looked better. I didn't feel the need to specifically address this at least in terms of systemic antibiotics. 05/09/18; wounds are measuring larger more drainage per our intake. We are using Santyl  covered with alginate on the large superficial buttock wounds, Iodosorb on the left heel, ketoconazole and silver alginate to the dorsal first and second toes bilaterally. 05/16/18; The area on his left buttock better in some aspects although the area superiorly over the ischial tuberosity required an extensive debridement.using Santyl  Left heel appears stable. Using Iodoflex The areas between his first and second toes are not bad however there is spreading erythema up the dorsal aspect of his left foot this looks like cellulitis again. He is insensate the erythema is really very  brilliant.o Erysipelas He went to see an allergist days ago because he was itching part of this he had lab work done. This showed [name] white count of 15.1 with 70% neutrophils. Hemoglobin of 11.4 and [name] platelet count of 659,000. Last white count we had in Epic was [name] 2-1/2 years ago which was 25.9 but he was ill at the time. He was able to show me some lab work that was done by his primary physician the pattern is about the same. 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 But prompted me to go ahead and do x-rays of both feet and the pelvis rule out osteomyelitis. He also had [name] comprehensive metabolic panel this was reasonably normal his albumin was 3.7 liver function tests BUN/creatinine all normal 05/23/18; x-rays of both his feet from last week were negative for underlying pulmonary abnormality. The x-ray of his pelvis however showed mild irregularity in the left ischial which may represent some early osteomyelitis. The wound in the left ischial continues to get deeper clearly now exposed muscle. Each week necrotic surface material over this area.  Whereas the rest of the wounds do not look so bad. The left ischial wound we have been using Santyl  and calcium alginate T the left heel surface necrotic debris using Iodoflex o The left lateral leg is still healed Areas on the left dorsal foot and the right dorsal foot are about the same. There is some inflammation on the left which might represent contact dermatitis, fungal dermatitis I am doubtful cellulitis although this looks better than last week 05/30/18; CT scan done at Hospital did not show any osteomyelitis or abscess. Suggested the possibility of underlying cellulitis although I don't see [name] lot of evidence of this at the bedside The wound itself on the left buttock/upper thigh actually looks somewhat better. No debridement Left heel also looks better no debridement continue Iodoflex Both dorsal first  second toe spaces appear better using Lotrisone. Left still required debridement 06/06/18; Intake reported some purulent looking drainage from the left gluteal wound. Using Santyl  and calcium alginate Left heel looks better although still [name] nonviable surface requiring debridement The left dorsal foot first/second webspace actually expanding and somewhat deeper. I may consider doing [name] shave biopsy of this area Right dorsal foot first/second webspace appears stable to improved. Using Lotrisone and silver alginate to both these areas 06/13/18 Left gluteal surface looks better. Now separated in the 2 wounds. No debridement required. Still drainage. We'll continue silver alginate Left heel continues to look better with Iodoflex continue this for at least another week Of his dorsal foot wounds the area on the left still has some depth although it looks better than last week. We've been using Lotrisone and silver alginate 06/20/18 Left gluteal continues to look better healthy tissue Left heel continues to look better healthy granulation wound is smaller. He is using Iodoflex and his long as this continues continue the Iodoflex Dorsal right foot looks better unfortunately dorsal left foot does not. There is swelling and erythema of his forefoot. He had minor trauma to this several days ago but doesn't think this was enough to have caused any tissue injury. Foot looks like cellulitis, we have had this problem before 06/27/18 on evaluation today patient appears to be doing [name] little worse in regard to his foot ulcer. 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 Both of which would really be his only options for treating this infection. In the past he has been given and effusion of Orbactiv. This is done very well for him in the past again it's one time dosing IV antibiotic  therapy. Subsequently I do believe this is something we're gonna need to see about doing at this point in time. Currently his other wounds seem to be doing somewhat better in my pinion I'[name] pretty happy in that regard. 07/03/18 on evaluation today patient's wounds actually appear to be doing fairly well. He has been tolerating the dressing changes without complication. All in all he seems to be showing signs of improvement. In regard to the antibiotics he has been dealing with infectious disease since I saw him last week as far as getting this scheduled. In the end [facility]'s [facility] this coming Friday. In the meantime he has been continuing to perform the dressing changes in such as previous. There does not appear to be any evidence of infection worsengin at  this time. 07/10/18; Since I last saw this man 2 weeks ago things have actually improved. IV antibiotics of resulted in less forefoot erythema although there is still some present. He is not systemically unwell Left buttock wounds 2 now have no depth there is increased epithelialization Using silver alginate Left heel still requires debridement using Iodoflex Left dorsal foot still with [name] sizable wound about the size of [name] border but healthy granulation Sabas, Arvis [name] ([id number]) [id number] Page 29 of 40 Right dorsal foot still with [name] slitlike area using silver alginate 07/18/18; the patient's cellulitis in the left foot is improved in fact I think it is on its way to resolving. Left buttock wounds 2 both look better although the larger one has hypertension granulation we've been using silver alginate Left heel has some thick circumferential redundant skin over the wound edge which will need to be removed today we've been using Iodoflex Left dorsal foot is still [name] sizable wound required debridement using silver alginate The right dorsal foot is just about closed  only [name] small open area remains here 07/25/18; left foot cellulitis is resolved Left buttock wounds 2 both look better. Hyper-granulation on the major area Left heel as some debris over the surface but otherwise looks [name] healthier wound. Using silver collagen Right dorsal foot is just about closed 07/31/18; arrives with our intake nurse worried about purulent drainage from the buttock. We had hyper-granulation here last week His buttock wounds 2 continue to look better Left heel some debris over the surface but measuring smaller. Right dorsal foot unfortunately has openings between the toes Left foot superficial wound looks less aggravated. 08/07/18 Buttock wounds continue to look better although some of her granulation and the larger medial wound. silver alginate Left heel continues to look [name] lot better.silver collagen Left foot superficial wound looks less stable. Requires debridement. He has [name] new wound superficial area on the foot on the lateral dorsal foot. Right foot looks better using silver alginate without Lotrisone 08/14/2018; patient was in the ER last week diagnosed with [name] UTI. He is now on Cefpodoxime and Macrodantin . Buttock wounds continued to be smaller. Using silver alginate Left heel continues to look better using silver collagen Left foot superficial wound looks as though it is improving Right dorsal foot area is just about healed. 08/21/2018; patient is completed his antibiotics for his UTI. He has 2 open areas on the buttocks. There is still not closed although the surface looks satisfactory. Using silver alginate Left heel continues to improve using silver collagen The bilateral dorsal foot areas which are at the base of his first and second toes/possible tinea pedis are actually stable on the left but worse on the right. The area on the left required debridement of necrotic surface. After debridement I obtained [name] specimen for PCR culture. The right dorsal foot which is been  just about healed last week is now reopened 08/28/2018; culture done on the left dorsal foot showed coag negative staph both staph epidermidis and Lugdunensis. I think this is worthwhile initiating systemic treatment. I will use doxycycline  given his long list of allergies. The area on the left heel slightly improved but still requiring debridement. The large wound on the buttock is just about closed whereas the smaller one is larger. Using silver alginate in this area 09/04/2018; patient is completing his doxycycline  for the left foot although this continues to be [name] very difficult wound area with very adherent necrotic debris. We are using silver alginate to all  his wounds right foot left foot and the small wounds on his buttock, silver collagen on the left heel. 09/11/2018; once again this patient has intense erythema and swelling of the left forefoot. Lesser degrees of erythema in the right foot. He has [name] long list of allergies and intolerances. I will reinstitute doxycycline . 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 Using silver alginate Left heel also looks better using silver collagen Unfortunately both the areas on his feet look worse. The area on the left first second webspace is now gone through to the plantar part of his foot. The area on the left foot anteriorly is irritated with erythema and swelling in the forefoot. 09/25/2018 His wound on the left plantar heel looks better. Using silver collagen The area on the left buttock 2 small remnant areas. One is closed one is still open. Using silver alginate The areas between both his first and second toes look worse. This in spite of long-standing antifungal therapy with ketoconazole and silver alginate which should have antifungal activity He has small areas around his original wound on the left calf one is on the bottom of the original scar tissue and one superiorly both of these are small  and superficial but again given wound history in this site this is worrisome 10/02/2018 Left plantar heel continues to gradually contract using silver collagen Left buttock wound is unchanged using silver alginate The areas on his dorsal feet between his first and second toes bilaterally look about the same. I prescribed clindamycin  ointment to see if we can address chronic staph colonization and also the underlying possibility of erythrasma The left lateral lower extremity wound is actually on the lateral part of his ankle. Small open area here. We have been using silver alginate 10/09/2018; Left plantar heel continues to look healthy and contract. No debridement is required Left buttock slightly smaller with [name] tape injury wound just below which was new this week Dorsal feet somewhat improved I have been using clindamycin  Left lateral looks lower extremity the actual open area looks worse although [name] lot of this is epithelialized. I am going to change to silver collagen today He has [name] lot more swelling in the right leg although this is not pitting not red and not particularly warm there is [name] lot of spasm in the right leg usually indicative of people with paralysis of some underlying discomfort. We have reviewed his vascular status from 2017 he had [name] left greater saphenous vein ablation. I wonder about referring him back to vascular surgery if the area on the left leg continues to deteriorate. 10/16/2018 in today for follow-up and management of multiple lower extremity ulcers. His left Buttock wound is much lower smaller and almost closed completely. The wound to the left ankle has began to reopen with Epithelialization and some adherent slough. He has multiple new areas to the left foot and leg. The left dorsal foot without much improvement. Wound present between left great webspace and 2nd toe. Erythema and edema present right leg. Right LE ultrasound obtained on 10/10/18 was negative for DVT  . 10/23/2018; Left buttock is closed over. Still dry macerated skin but there is no open wound. I suspect this is chronic pressure/moisture Left lateral calf is quite [name] bit worse than when I saw this last. There is clearly drainage here he has macerated skin into the left plantar heel. We will change the primary dressing to alginate Left dorsal  foot has some improvement in overall wound area. Still using clindamycin  and silver alginate Right dorsal foot about the same as the left using clindamycin  and silver alginate The erythema in the right leg has resolved. He is DVT rule out was negative Left heel pressure area required debridement although the wound is smaller and the surface is health 10/26/2018 The patient came back in for his nurse check today predominantly because of the drainage coming out of the left lateral leg with [name] recent reopening of his original wound on the left lateral calf. He comes in today with [name] large amount of surrounding erythema around the wound extending from the calf into the ankle and even in the area on the dorsal foot. He is not systemically unwell. He is not febrile. Nevertheless this looks like cellulitis. We have been using silver alginate to the area. I changed him to [name] regular visit and I am going to prescribe him doxycycline . The rationale here is [name] long list of medication intolerances and [name] history of MRSA. I did not see anything that I thought would provide [name] valuable culture 10/30/2018 Follow-up from his appointment 4 days ago with really an extensive area of cellulitis in the left calf left lateral ankle and left dorsal foot. I put him on doxycycline . He has [name] long list of medication allergies which are true allergy reactions. 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 In any case he is [name] lot better today. The erythema especially in the anterior and lateral left calf is better. He still has  left ankle erythema. Mcdanel, Mahmood [name] ([id number]) [id number] Page 30 of 40 He also is complaining about increasing edema in the right leg we have only been using Kerlix Coban and he has been doing the wraps at home. Finally he has [name] spotty rash on the medial part of his upper left calf which looks like folliculitis or perhaps wrap occlusion type injury. Small superficial macules not pustules 11/06/18 patient arrives today with again [name] considerable degree of erythema around the wound on the left lateral calf extending into the dorsal ankle and dorsal foot. This is [name] lot worse than when I saw this last week. He is on doxycycline  really with not [name] lot of improvement. He has not been systemically unwell Wounds on the; left heel actually looks improved. Original area on the left foot and proximity to the first and second toes looks about the same. He has superficial areas on the dorsal foot, anterior calf and then the reopening of his original wound on the left lateral calf which looks about the same The only area he has on the right is the dorsal webspace first and second which is smaller. He has [name] large area of dry erythematous skin on the left buttock small open area here. 11/13/2018; the patient arrives in much better condition. The erythema around the wound on the left lateral calf is [name] lot better. Not sure whether this was the clindamycin  or the TCA and ketoconazole or just in the improvement in edema control [stasis dermatitis]. In any case this is [name] lot better. The area on the left heel is very small and just about resolved using silver collagen we have been using silver alginate to the areas on his dorsal feet 11/20/2018; his wounds include the left lateral calf, left heel, dorsal aspects of both feet just proximal to the first second webspace. He is stable to slightly  improved. I did not think any changes to his dressings were going to be necessary 11/27/2018 he has [name]  reopening on the left buttock which is surrounded by what looks like tinea or perhaps some other form of dermatitis. The area on the left dorsal foot has some erythema around it I have marked this area but I am not sure whether this is cellulitis or not. Left heel is not closed. Left calf the reopening is really slightly longer and probably worse 1/13; in general things look better and smaller except for the left dorsal foot. Area on the left heel is just about closed, left buttock looks better only [name] small wound remains in the skin looks better [using Lotrisone] 1/20; the area on the left heel only has [name] few remaining open areas here. Left lateral calf about the same in terms of size, left dorsal foot slightly larger right lateral foot still not closed. The area on the left buttock has no open wound and the surrounding skin looks [name] lot better 1/27; the area on the left heel is closed. Left lateral calf better but still requiring extensive debridements. The area on his left buttock is closed. He still has the open areas on the left dorsal foot which is slightly smaller in the right foot which is slightly expanded. We have been using Iodoflex on these areas as well 2/3; left heel is closed. Left lateral calf still requiring debridement using Iodoflex there is no open area on his left buttock however he has dry scaly skin over [name] large area of this. Not really responding well to the Lotrisone. Finally the areas on his dorsal feet at the level of the first second webspace are slightly smaller on the right and about the same on the left. Both of these vigorously debrided with Anasept and gauze 2/10; left heel remains closed he has dry erythematous skin over the left buttock but there is no open wound here. Left lateral leg has come in and with. Still requiring debridement we have been using Iodoflex here. Finally the area on the left dorsal foot and right dorsal foot are really about the same extremely  dry callused fissured areas. He does not yet have [name] dermatology appointment 2/17; left heel remains closed. He has [name] new open area on the left buttock. The area on the left lateral calf is bigger longer and still covered in necrotic debris. No major change in his foot areas bilaterally. I am awaiting for [name] dermatologist to look on this. We have been using ketoconazole I do not know that this is been doing any good at all. 2/24; left heel remains closed. The left buttock wound that was new reopening last week looks better. The left lateral calf appears better also although still requires debridement. The major area on his foot is the left first second also requiring debridement. We have been putting Prisma on all wounds. I do not believe that the ketoconazole has done too much good for his feet. He will use Lotrisone I am going to give him [name] 2-week course of terbinafine. We still do not have [name] dermatology appointment 3/2 left heel remains closed however there is skin over bone in this area I pointed this out to him today. The left buttock wound is epithelialized but still does not look completely stable. The area on the left leg required debridement were using silver collagen here. With regards to his feet we changed to Lotrisone last week and silver alginate. 3/9;  left heel remains closed. Left buttock remains closed. The area on the right foot is essentially closed. The left foot remains unchanged. Slightly smaller on the left lateral calf. Using silver collagen to both of these areas 3/16-Left heel remains closed. Area on right foot is closed. Left lateral calf above the lateral malleolus open wound requiring debridement with easy bleeding. Left dorsal wound proximal to first toe also debrided. Left ischial area open new. Patient has been using Prisma with wrapping every 3 days. Dermatology appointment is apparently tomorrow.Patient has completed his terbinafine 2-week course with some apparent  improvement according to him, there is still flaking and dry skin in his foot on the left 3/23; area on the right foot is reopened. The area on the left anterior foot is about the same still [name] very necrotic adherent surface. He still has the area on the left leg and reopening is on the left buttock. He apparently saw dermatology although I do not have [name] note. According to the patient who is usually fairly well informed they did not have any good ideas. Put him on oral terbinafine which she is been on before. 3/30; using silver collagen to all wounds. Apparently his dermatologist put him on doxycycline  and rifampin presumably some culture grew staph. I do not have this result. He remains on terbinafine although I have used terbinafine on him before 4/6; patient has had [name] fairly substantial reopening on the right foot between the first and second toes. He is finished his terbinafine and I believe is on doxycycline  and rifampin still as prescribed by dermatology. We have been using silver collagen to all his wounds although the patient reports that he thinks silver alginate does better on the wounds on his buttock. 4/13; the area on his left lateral calf about the same size but it did not require debridement. Left dorsal foot just proximal to the webspace between the first and second toes is about the same. Still nonviable surface. I note some superficial bronze discoloration of the dorsal part of his foot Right dorsal foot just proximal to the first and second toes also looks about the same. I still think there may be the same discoloration I noted above on the left Left buttock wound looks about the same 4/20; left lateral calf appears to be gradually contracting using silver collagen. 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 The left dorsal foot wound is debrided of tightly adherent necrotic debris and really cleans up quite nicely. The  right area is worse with expansion. I did not debride this it is now over the base of the second toe The area on his left buttock is smaller no debridement is required using silver collagen 5/4; left calf continues to make good progress. He arrives with erythema around the wounds on his dorsal foot which even extends to the plantar aspect. Very concerning for coexistent infection. He is finished the erythromycin I gave him for possible erythrasma this does not seem to have helped. The area on the left foot is about the same base of the dorsal toes Is area on the buttock looks improved on the left 5/11; left calf and left buttock continued to make good progress. Left foot is about the same to slightly improved. Major problem is on the right foot. He has not had an x-ray. Deep tissue culture I did last week showed both Enterobacter and [name] coli. I did not change the doxycycline  I put him on  empirically although neither 1 of these were plated to doxycycline . He arrives today with the erythema looking worse on both the dorsal and plantar foot. Macerated skin on the bottom of the foot. he has not been systemically unwell 5/18-Patient returns at 1 week, left calf wound appears to be making some progress, left buttock wound appears slightly worse than last time, left foot wound looks slightly better, right foot redness is marginally better. X-ray of both feet show no air or evidence of osteomyelitis. Patient is finished his Omnicef  and terbinafine. He continues to have macerated skin on the bottom of the left foot as well as right 5/26; left calf wound is better, left buttock wound appears to have multiple small superficial open areas with surrounding macerated skin. X-rays that I did last time showed no evidence of osteomyelitis in either foot. He is finished cefdinir  and doxycycline . I do not think that he was on terbinafine. He continues to have [name] large superficial open area on the right foot anterior  dorsal and slightly between the first and second toes. I did send him to dermatology 2 months ago or so wondering about whether they would do [name] fungal scraping. I do not believe they did but did do [name] culture. We have been using silver alginate to the toe areas, he has been using antifungals at home topically either ketoconazole or Lotrisone. We are using silver collagen on the left foot, silver alginate on the right, silver collagen on the left lateral leg and silver alginate on the left buttock 6/1; left buttock area is healed. We have the left dorsal foot, left lateral leg and right dorsal foot. We are using silver alginate to the areas on both feet and silver collagen to the area on his left lateral calf 6/8; the left buttock apparently reopened late last week. He is not really sure how this happened. He is tolerating the terbinafine. Using silver alginate to all wounds 6/15; left buttock wound is larger than last week but still superficial. Galluzzo, Asaf [name] ([id number]) [id number] Page 31 of 40 Came in the clinic today with [name] report of purulence from the left lateral leg I did not identify any infection Both areas on his dorsal feet appear to be better. He is tolerating the terbinafine. Using silver alginate to all wounds 6/22; left buttock is about the same this week, left calf quite [name] bit better. His left foot is about the same however he comes in with erythema and warmth in the right forefoot once again. Culture that I gave him in the beginning of May showed Enterobacter and [name] coli. I gave him doxycycline  and things seem to improve although neither 1 of these organisms was specifically plated. 6/29; left buttock is larger and dry this week. Left lateral calf looks to me to be improved. Left dorsal foot also somewhat improved right foot completely unchanged. The erythema on the right foot is still present. He is completing the Ceftin dinner that I gave him empirically  [see discussion above.) 7/6 - All wounds look to be stable and perhaps improved, the left buttock wound is slightly smaller, per patient bleeds easily, completed ceftin, the right foot redness is less, he is on terbinafine 7/13; left buttock wound about the same perhaps slightly narrower. Area on the left lateral leg continues to narrow. Left dorsal foot slightly smaller right foot about the same. We are using silver alginate on the right foot and Hydrofera Blue to the areas on the left. Unna boot  on the left 2 layer compression on the right 7/20; left buttock wound absolutely the same. Area on lateral leg continues to get better. Left dorsal foot require debridement as did the right no major change in the 7/27; left buttock wound the same size necrotic debris over the surface. The area on the lateral leg is closed once again. His left foot looks better right foot about the same although there is some involvement now of the posterior first second toe area. He is still on terbinafine which I have given him for [name] month, not certain [name] centimeter major change 06/25/19-All wounds appear to be slightly improved according to report, left buttock wound looks clean, both foot wounds have minimal to no debris the right dorsal foot has minimal slough. We are using Hydrofera Blue to the left and silver alginate to the right foot and ischial wound. 8/10-Wounds all appear to be around the same, the right forefoot distal part has some redness which was not there before, however the wound looks clean and small. Ischial wound looks about the same with no changes 8/17; his wound on the left lateral calf which was his original chronic venous insufficiency wound remains closed. Since I last saw him the areas on the left dorsal foot right dorsal foot generally appear better but require debridement. The area on his left initial tuberosity appears somewhat larger to me perhaps hyper granulated and bleeds very easily. We  have been using Hydrofera Blue to the left dorsal foot and silver alginate to everything else 8/24; left lateral calf remains closed. The areas on his dorsal feet on the webspace of the first and second toes bilaterally both look better. The area on the left buttock which is the pressure ulcer stage II slightly smaller. I change the dressing to Hydrofera Blue to all areas 8/31; left lateral calf remains closed. The area on his dorsal feet bilaterally look better. Using Hydrofera Blue. Still requiring debridement on the left foot. No change in the left buttock pressure ulcers however 9/14; left lateral calf remains closed. Dorsal feet look quite [name] bit better than 2 weeks ago. Flaking dry skin also [name] lot better with the ammonium lactate I gave him 2 weeks ago. The area on the left buttock is improved. He states that his Roho cushion developed [name] leak and he is getting [name] new one, in the interim he is offloading this vigorously 9/21; left calf remains closed. Left heel which was [name] possible DTI looks better this week. He had macerated tissue around the left dorsal foot right foot looks satisfactory and improved left buttock wound. I changed his dressings to his feet to silver alginate bilaterally. Continuing Hydrofera Blue on the left buttock. 9/28 left calf remains closed. Left heel did not develop anything [possible DTI] dry flaking skin on the left dorsal foot. Right foot looks satisfactory. Improved left buttock wound. We are using silver alginate on his feet Hydrofera Blue on the buttock. I have asked him to go back to the Lotrisone on his feet including the wounds and surrounding areas 10/5; left calf remains closed. The areas on the left and right feet about the same. [name] lot of this is epithelialized however debris over the remaining open areas. He is using Lotrisone and silver alginate. The area on the left buttock using Hydrofera Blue 10/26. Patient has been out for 3 weeks secondary to Covid  concerns. He tested negative but I think his wife tested positive. He comes in today with the  left foot substantially worse, right foot about the same. Even more concerning he states that the area on his left buttock closed over but then reopened and is considerably deeper in one aspect than it was before [stage III wound] 11/2; left foot really about the same as last week. Quarter sized wound on the dorsal foot just proximal to the first second toes. Surrounding erythema with areas of denuded epithelium. This is not really much different looking. Did not look like cellulitis this time however. Right foot area about the same.. We have been using silver alginate alginate on his toes Left buttock still substantial irritated skin around the wound which I think looks somewhat better. We have been using Hydrofera Blue here. 11/9; left foot larger than last week and [name] very necrotic surface. Right foot I think is about the same perhaps slightly smaller. Debris around the circumference also addressed. Unfortunately on the left buttock there is been [name] decline. Satellite lesions below the major wound distally and now [name] an additional one posteriorly we have been using Hydrofera Blue but I think this is [name] pressure issue 11/16; left foot ulcer dorsally again [name] very adherent necrotic surface. Right foot is about the same. Not much change in the pressure ulcer on his left buttock. 11/30; left foot ulcer dorsally basically the same as when I saw him 2 weeks ago. Very adherent fibrinous debris on the wound surface. Patient reports [name] lot of drainage as well. 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 We have been using Iodoflex, patient changed back to alginate because of the drainage. Area on his right dorsal foot really looks benign with [name] healthier surface certainly [name] lot better than on the left. Left buttock wounds all improved using Hydrofera Blue 12/7; left dorsal  foot again no improvement. Tightly adherent debris. PCR culture I did last week only showed likely skin contaminant. I have gone ahead and done [name] punch biopsy of this which is about the last thing in terms of investigations I can think to do. He has known venous insufficiency and venous hypertension and this could be the issue here. The area on the right foot is about the same left buttock slightly worse according to our intake nurse secondary to [facility] Blue sticking to the wound 12/14; biopsy of the left foot that I did last time showed changes that could be related to wound healing/chronic stasis dermatitis phenomenon no neoplasm. We have been using silver alginate to both feet. I change the one on the left today to Sorbact and silver alginate to his other 2 wounds 12/28; the patient arrives with the following problems; Major issue is the dorsal left foot which continues to be [name] larger deeper wound area. Still with [name] completely nonviable surface Paradoxically the area mirror image on the right on the right dorsal foot appears to be getting better. He had some loss of dry denuded skin from the lower part of his original wound on the left lateral calf. Some of this area looked [name] little vulnerable and for this reason we put him in wrap that on this side this week The area on his left buttock is larger. He still has the erythematous circular area which I think is [name] combination of pressure, sweat. This does not look like cellulitis or fungal dermatitis 11/26/2019; -Dorsal left foot large open wound with depth. Still debris over the surface. Using Sorbact The area on the dorsal right foot paradoxically has  closed over He has [name] reopening on the left ankle laterally at the base of his original wound that extended up into the calf. This appears clean. The left buttock wound is smaller but with very adherent necrotic debris over the surface. We have been using silver alginate here as well The patient had  arterial studies done in 2017. He had biphasic waveforms at the dorsalis pedis and posterior tibial bilaterally. ABI in the left was 1.17. Digit waveforms were dampened. He has slight spasticity in the great toes I do not think [name] TBI would be possible 1/11; the patient comes in today with [name] sizable reopening between the first and second toes on the right. This is not exactly in the same location where we have been treating wounds previously. According to our intake nurse this was actually fairly deep but 0.6 cm. The area on the left dorsal foot looks about the same the surface is somewhat cleaner using Sorbact, his MRI is in 2 days. We have not managed yet to get arterial studies. The new reopening on the left lateral calf looks somewhat better using alginate. The left buttock wound is about the same using alginate 1/18; the patient had his ARTERIAL studies which were quite normal. ABI in the right at 1.13 with triphasic/biphasic waveforms on the left ABI 1.06 again with triphasic/biphasic waveforms. It would not have been possible to have done [name] toe brachial index because of spasticity. We have been using Sorbac to the left foot alginate to the rest of his wounds on the right foot left lateral calf and left buttock 1/25; arrives in clinic with erythema and swelling of the left forefoot worse over the first MTP area. This extends laterally dorsally and but also posteriorly. Still has an area on the left lateral part of the lower part of his calf wound it is eschared and clearly not closed. Area on the left buttock still with surrounding irritation and erythema. Right foot surface wound dorsally. The area between the right and first and second toes appears better. 2/1; Hensen, Zadrian [name] ([id number]) [id number] Page 32 of 40 The left foot wound is about the same. Erythema slightly better I gave him [name] week of doxycycline  empirically Right foot wound is more extensive extending  between the toes to the plantar surface Left lateral calf really no open surface on the inferior part of his original wound however the entire area still looks vulnerable Absolutely no improvement in the left buttock wound required debridement. 2/8; the left foot is about the same. Erythema is slightly improved I gave him clindamycin  last week. Right foot looks better he is using Lotrimin and silver alginate He has [name] breakdown in the left lateral calf. Denuded epithelium which I have removed Left buttock about the same were using Hydrofera Blue 2/15; left foot is about the same there is less surrounding erythema. Surface still has tightly adherent debris which I have debriding however not making any progress Right foot has [name] substantial wound on the medial right second toe between the first and second webspace. Still an open area on the left lateral calf distal area. Buttock wound is about the same 2/22; left foot is about the same less surrounding erythema. Surface has adherent debris. Polymen Ag Right foot area significant wound between the first and second toes. We have been using silver alginate here Left lateral leg polymen Ag at the base of his original venous insufficiency wound Left buttock some improvement here 3/1; Right foot is deteriorating  in the first second toe webspace. Larger and more substantial. We have been using silver alginate. Left dorsal foot about the same markedly adherent surface debris using PolyMem Ag Left lateral calf surface debris using PolyMem AG Left buttock is improved again using PolyMem Ag. He is completing his terbinafine. The erythema in the foot seems better. He has been on this for 2 weeks 3/8; no improvement in any wound area in fact he has [name] small open area on the dorsal midfoot which is new this week. He has not gotten his foot x-rays yet 3/15; his x-rays were both negative for osteomyelitis of both feet. No major change in any of his wounds on the  extremities however his buttock wounds are better. We have been using polymen on the buttocks, left lower leg. Iodoflex on the left foot and silver alginate on the right 3/22; arrives in clinic today with the 2 major issues are the improvement in the left dorsal foot wound which for once actually looks healthy with [name] nice healthy wound surface without debridement. Using Iodoflex here. Unfortunately on the left lateral calf which is in the distal part of his original wound he came to the clinic here for there was purulent drainage noted some increased breakdown scattered around the original area and [name] small area proximally. We we are using polymen here will change to silver alginate today. His buttock wound on the left is better and I think the area on the right first second toe webspace is also improved 3/29; left dorsal foot looks better. Using Iodoflex. Left ankle culture from deterioration last time grew [name] coli, Enterobacter and Enterococcus. I will give him [name] course of cefdinir  although that will not cover Enterococcus. The area on the right foot in the webspace of the first and second toe lateral first toe looks better. The area on his buttock is about healed Vascular appointment is on April 21. This is to look at his venous system vis--vis continued breakdown of the wounds on the left including the left lateral leg and left dorsal foot he. He has had previous ablations on this side 4/5; the area between the right first and second toes lateral aspect of the first toe looks better. Dorsal aspect of the left first toe on the left foot also improved. Unfortunately the left lateral lower leg is larger and there is [name] second satellite wound superiorly. The usual superficial abrasions on the left buttock overall better but certainly not closed 4/12; the area between the right first and second toes is improved. Dorsal aspect of the left foot also slightly smaller with [name] vibrant healthy looking  surface. No real change in the left lateral leg and the left buttock wound is healed He has an unaffordable co-pay for Apligraf. Appointment with vein and vascular with regards to the left leg venous part of the circulation is on 4/21 4/19; we continue to see improvement in all wound areas. Although this is minor. He has his vascular appointment on 4/21. The area on the left buttock has not reopened although right in the center of this area the skin looks somewhat threatened 4/26; the left buttock is unfortunately reopened. In general his left dorsal foot has [name] healthy surface and looks somewhat smaller although it was not measured as such. The area between his first and second toe webspace on the right as [name] small wound against the first toe. The patient saw vascular surgery. The real question I was asking was about the small  saphenous vein on the left. He has previously ablated left greater saphenous vein. Nothing further was commented on on the left. Right greater saphenous vein without reflux at the saphenofemoral junction or proximal thigh there was no indication for ablation of the right greater saphenous vein duplex was negative for DVT bilaterally. They did not think there was anything from [name] vascular surgery point of view that could be offered. They ABIs within normal limits 5/3; only small open area on the left buttock. The area on the left lateral leg which was his original venous reflux is now 2 wounds both which look clean. We are using Iodoflex on the left dorsal foot which looks healthy and smaller. He is down to [name] very tiny area between the right first and second toes, using silver alginate 5/10; all of his wounds appear better. We have much better edema control in 4 layer compression on the left. This may be the factor that is allowing the left foot and left lateral calf to heal. He has external compression garments at home 04/14/20-All of his wounds are progressing well, the left  forefoot is practically closed, left ischium appears to be about the same, right toe webspace is also smaller. The left lateral leg is about the same, continue using Hydrofera Blue to this, silver alginate to the ischium, Iodoflex to the toe space on the right 6/7; most of his wounds outside of the left buttock are doing well. The area on the left lateral calf and left dorsal foot are smaller. The area on the right foot in between the first and second toe webspace is barely visible although he still says there is some drainage here is the only reason I did not heal this out. Unfortunately the area on the left buttock almost looks like he has [name] skin tear from tape. He has open wound and then [name] large flap of skin that we are trying to get adherence over an area just next to the remaining wound 6/21; 2 week follow-up. I believe is been here for nurse visits. Miraculously the area between his first and second toes on the left dorsal foot is closed over. Still open on the right first second web space. The left lateral calf has 2 open areas. Distally this is more superficial. The proximal area had [name] little more depth and required debridement of adherent necrotic material. His buttock wound is actually larger we have been using silver alginate here 6/28; the patient's area on the left foot remains closed. Still open wet area between the first and second toes on the right and also extending into the plantar aspect. We have been using silver alginate in this location. He has 2 areas on the left lower leg part of his original long wounds which I think are better. We have been using Hydrofera Blue here. Hydrofera Blue to the left buttock which is stable 7/12; left foot remains closed. Left ankle is closed. May be [name] small area between his right first and second toes the only truly open area is on the left buttock. We have been using Hydrofera Blue here 7/19; patient arrives with marked deterioration especially in  the left foot and ankle. We did not put him in [name] compression wrap on the left last week in fact he wore his juxta lite stockings on either side although he does not have an underlying stocking. He has [name] reopening on the left dorsal foot, left lateral ankle and [name] new area on the right  dorsal ankle. More worrisome is the degree of erythema on the left foot extending on the lateral foot into the lateral lower leg on the left 7/26; the patient had erythema and drainage from the lateral left ankle last week. Culture of this grew MRSA resistant to doxycycline  and clindamycin  which are the 2 antibiotics we usually use with this patient who has multiple antibiotic allergies including linezolid, trimethoprim sulfamethoxazole. I had give him an empiric doxycycline  and he comes in the area certainly looks somewhat better although it is blotchy in his lower leg. He has not been systemically unwell. He has had areas on the left dorsal foot which is [name] reopening, chronic wounds on the left lateral ankle. Both of these I think are secondary to chronic venous insufficiency. The area between his first and second toes is closed as far as I can tell. He had [name] new wrap injury on the right dorsal ankle last week. Finally he has an area on the left buttock. We have been using silver alginate to everything except the left buttock we are using Hydrofera Blue 06/30/20-Patient returns at 1 week, has been given [name] sample dose pack of NUZYRA  which is [name] tetracycline derivative [omadacycline ], patient has completed those, we have been using silver alginate to almost all the wounds except the left ischium where we are using Hydrofera Blue all of them look better 8/16; since I last saw the patient he has been doing well. The area on the left buttock, left lateral ankle and left foot are all closed today. He has completed the Nuzyra  I gave him last time and tolerated this well. He still has open areas on the right dorsal ankle and in the  right first second toe area which we are using silver alginate. 8/23; we put him in his bilateral external compression stockings last week as he did not have anything open on either leg except for concerning area between the right first and second toe. He comes in today with an area on the left dorsal foot slightly more proximal than the original wound, the left lateral foot but this is actually [name] continuation of the area he had on the left lateral ankle from last time. As well he is opened up on the left buttock again. 8/30; comes in today with things looking [name] lot better. The area on the left lower ankle has closed down as has the left foot but with eschar in both areas. The area on the dorsal right ankle is also epithelialized. Very little remaining of the left buttock wound. We have been using silver alginate on all wound areas Peyton, Kade [name] ([id number]) [id number] Page 33 of 40 9/13; the area in the first second toe webspace on the right has fully epithelialized. He still has some vulnerable epithelium on the right and the ankle and the dorsal foot. He notes weeping. He is using his juxta lite stocking. On the left again the left dorsal foot is closed left lateral ankle is closed. We went to the juxta lite stocking here as well. Still vulnerable in the left buttock although only 2 small open areas remain here 9/27; 2-week follow-up. We did not look at his left leg but the patient says everything is closed. He is [name] bit disturbed by the amount of edema in his left foot he is using juxta lite stockings but asking about over the toes stockings which would be 30/40, will talk to him next time. According to him there is no open  wound on either the left foot or the left ankle/calf He has an open area on the dorsal right calf which I initially point [name] wrap injury. He has superficial remaining wound on the left ischial tuberosity been using silver alginate although he says  this sticks to the wound 10/5; we gave him 2-week follow-up but he called yesterday expressing some concerns about his right foot right ankle and the left buttock. He came in early. There is still no open areas on the left leg and that still in his juxta lite stocking 10/11; he only has 1 small area on the left buttock that remains measuring millimeters 1 mm. Still has the same irritated skin in this area. We recommended zinc oxide when this eventually closes and pressure relief is meticulously is he can do this. He still has an area on the dorsal part of his right first through third toes which is [name] bit irritated and still open and on the dorsal ankle near the crease of the ankle. We have been using silver alginate and using his own stocking. He has nothing open on the left leg or foot 10/25; 2-week follow-up. Not nearly as good on the left buttock as I was hoping. For open areas with 5 looking threatened small. He has the erythematous irritated chronic skin in this area. 1 area on the right dorsal ankle. He reports this area bleeds easily Right dorsal foot just proximal to the base of his toes We have been using silver alginate. 11/8; 2-week follow-up. Left buttock is about the same although I do not think the wounds are in the same location we have been using silver alginate. I have asked him to use zinc oxide on the skin around the wounds. He still has [name] small area on the right dorsal ankle he reports this bleeds easily Right dorsal foot just proximal to the base of the toes does not have anything open although the skin is very dry and scaly He has [name] new opening on the nailbed of the left great toe. Nothing on the left ankle 11/29; 3-week follow-up. Left buttock has 2 open areas. And washing of these wounds today started bleeding easily. Suggesting very friable tissue. We have been using silver alginate. Right dorsal ankle which I thought was initially [name] wrap injury we have been using silver  alginate. Nothing open between the toes that I can see. He states the area on the left dorsal toe nailbed healed after the last visit in 2 or 3 days 12/13; 3-week follow-up. His left buttock now has 3 open areas but the original 2 areas are smaller using polymen here. Surrounding skin looks better. The right dorsal ankle is closed. He has [name] small opening on the right dorsal foot at the level of the third toe. In general the skin looks better here. He is wearing his juxta lite stocking on the left leg says there is nothing open 11/24/2020; 3 weeks follow-up. His left buttock still has the 3 open areas. We have been using polymen but due to lack of response he changed to Hydrofera Blue area. Surrounding skin is dry erythematous and irritated looking. There is no evidence of infection either bacterial or fungal however there is loss of surface epithelium He still has very dry skin in his foot causing irritation and erythema on the dorsal part of his toes. This is not responded to prolonged courses of antifungal simply looks dry and irritated 1/24; left buttock area still looks about the  same he was unable to find the triad ointment that we had suggested. The area on the right lower leg just above the dorsal ankle has reopened and the areas on the right foot between the first second and second third toes and scaling on the bottom of the foot has been about the same for quite some time now. been using silver alginate to all wound areas 2/7; left buttock wound looked quite good although not much smaller in terms of surface area surrounding skin looks better. Only [name] few dry flaking areas on the right foot in between the first and second toes the skin generally looks better here [ammonium lactate]. Finally the area on the right dorsal ankle is closed 2/21; There is no open area on the right foot even between the right first and second toe. Skin around this area dorsally and plantar aspects look better. He has  [name] reopening of the area on the right ankle just above the crease of the ankle dorsally. I continue to think that this is probably friction from spasms may be even this time with his stocking under the compression stockings. Wounds on his left buttock look about the same there [name] couple of areas that have reopened. He has [name] total square area of loss of epithelialization. This does not look like infection it looks like [name] contact dermatitis but I just cannot determine to what 3/14; there is nothing on the right foot between the first and second toes this was carefully inspected under illumination. Some chronic irritation on the dorsal part of his foot from toes 1-3 at the base. Nothing really open here substantially. Still has an area on the right foot/ankle that is actually larger and hyper granulated. His buttock area on the left is just about closed however he has chronic inflammation with loss of the surface epithelial layer 3/28; 2-week follow-up. In clinic today with [name] new wound on the left anterior mid tibia. Says this happened about 2 weeks ago. He is not really sure how wonders about the spasticity of his legs at night whether that could have caused this other than that he does not have [name] good idea. He has been using topical antibiotics and silver alginate. The area on his right dorsal ankle seems somewhat better. Finally everything on his left buttock is closed. 4/11; 2-week follow-up. All of his wounds are better except for the area over the ischium and left buttock which have opened up widely again. At least part of this is covered in necrotic fibrinous material another part had rolled nonviable skin. The area on the right ankle, left anterior mid tibia are both [name] lot better. He had no open wounds on either foot including the areas between the first and second toes 4/25; patient presents for 2-week follow-up. He states that the wounds are overall stable. He has no complaints today and states  he is using Hydrofera Blue to open wounds. 5/9; have not seen this man in over [name] month. For my memory he has open areas on the left mid tibia and right ankle. T oday he has new open area on the right dorsal foot which we have not had [name] problem with recently. He has the sustained area on the left buttock He is also changed his insurance at the beginning of the year [employer]. We will need prior authorizations for debridement 5/23; patient presents for 2-week follow-up. He has prior authorizations for debridement. He denies any issues in the past 2 weeks  with his wound care. He has been using Hydrofera Blue to all the wounds. He does report [name] circular rash to the upper left leg that is new. He denies acute signs of infection. 6/6; 2-week follow-up. The patient has open wounds on the left buttock which are worse than the last time I saw this about [name] month ago. He also has [name] new area to me on the left anterior mid tibia with some surrounding erythema. The area on the dorsal ankle on the right is closed but I think this will be [name] friction injury every time this area is exposed to either our wraps or his compression stockings caused by unrelenting spasms in this leg. 6/20; 2-week follow-up.  The patient has open wounds on the left buttock which is about the same. Using Hydrofera Blue here. - The left mid tibia has [name] static amount of surrounding erythema. Also [name] raised area in the center. We have been using Hydrofera Blue here. Finally he has broken down in his dorsal right foot extending between the first and second toes and going to the base of the first and second toe webspace. I have previously assumed that this was severe venous hypertension 7/5; 2-week follow-up  The left buttock wound actually looks better. We are using Hydrofera Blue. He has extensive skin irritation around this area and I have not really been able to get that any better. I have tried Lotrisone i.[name] antifungals and steroids.  More most recently we have just been using Coloplast really looks about the same.  The left mid tibia which was new last week culture to have very resistant staph aureus. Not only methicillin-resistant but doxycycline  resistant. The patient has [name] plethora of antibiotic allergies including sulfa, linezolid. I used topical bacitracin  on this but he has not started this yet.  In addition he has an expanding area of erythema with [name] wound on the dorsal right foot. I did [name] deep tissue culture of this area today 7/12;  Left buttock area actually looks better surrounding skin also looks less irritated.  Left mid tibia looks about the same. He is using bacitracin  this is not worse  Right dorsal foot looks about the same as well.  The left first toe also looks about the same 7/19; left buttock wound continues to improve in terms of open areas  Left mid tibia is still concerning amount of swelling he is using bacitracin  Heath, Ras [name] ([id number]) [id number] Page 34 of 40  Dorsal left first toe somewhat smaller  Right dorsal foot somewhat smaller 7/25; left buttock wound actually continues to improve  Left mid tibia area has less swelling. I gave him all my samples of new Nuzyra . This seems to have helped although the wound is still open it. His abrasion closed by here  Left dorsal great toe really no better. Still [name] very nonviable surface  Right dorsal foot perhaps some better.  We have been using bacitracin  and silver nitrate to the areas on his lower legs and Hydrofera Blue to the area on the buttock. 8/16  Disappointed that his left buttock wound is actually more substantial. Apparently during the last nurse visit these were both very small. He has continued irritation to [name] large area of skin on his buttock. I have never been able to totally explain this although I think it some combination of the way he sits, pressure, moisture. He is not incontinent enough to contribute  to this.  Left dorsal great toe still fibrinous  debris on the surface that I have debrided today  Large area across the dorsal right toes.  The area on the left anterior mid tibia has less swelling. He completed the Nuzyra . This does not look infected although the tissue is still fried 8/30; 2-week follow-up.  Left buttock areas not improved. We used Hydrofera Blue on this. Weeping wet with the surrounding erythema that I have not been able to control even with Lotrisone and topical Coloplast  Left dorsal great toe looks about the same  More substantial area again at the base of his toes on the left which is new this week.  Area across the dorsal right toes looks improved  The left anterior mid tibia looks like it is trying to close 9/13; 2-week follow-up. Using silver alginate on all of his wounds. The left dorsal foot does not look any better. He has the area on the dorsal toe and also the areas at the base of all of his toes 1 through 3. On the right foot he has [name] similar pattern in [name] similar area. He has the area on his left mid tibia that looks fairly healthy. Finally the area on his left buttock looks somewhat bette 9/20; culture I did of the left foot which was [name] deep tissue culture last time showed [name] coli he has erythema around this wound. Still [name] completely necrotic surface. His right dorsal foot looks about the same. He has [name] very friable surface to the left anterior mid tibia. Both buttock wounds look better. We have been using silver alginate to all wounds 10/4; he has completed the cephalexin  that I gave him last time for the left foot. He is using topical gentamicin under silver alginate silver alginate being applied to all the wounds. Unfortunately all the wounds look irritated on his dorsal right foot dorsal left foot left mid tibia. I wonder if this could be [name] silver allergy. I am going to change him to Hydrofera Blue on the lower extremity. The skin on the left buttock and left  posterior thigh still flaking dry and irritated. This has continued no matter what I have applied topically to this. He has [name] solitary open wound which by itself does not look too bad however the entire area of surrounding skin does not change no matter what we have applied here 10/18; the area on the left dorsal foot and right dorsal foot both look better. The area on the right extends into the plantar but not between his toes. We have been using silver alginate. He still has [name] rectangular erythematous area around the area on the left tibia. The wound itself is very small. Finally everything on his left buttock looks [name] little larger the skin is erythematous 11/15; patient comes in with the left dorsal and right dorsal foot distally looking somewhat better. Still nonviable surface on the left foot which required debridement. He still has the area on the left anterior mid tibia although this looks somewhat better. He has [name] new area on the right lateral lower leg just above the ankle. Finally his left buttock looks terrible with multiple superficial open wounds geometric square shaped area of chronic erythema which I have not been able to sort through 11/29; right dorsal foot and left dorsal foot both look somewhat better. No debridement required. He has the fragile area on the left anterior mid tibia this looks and continues to look somewhat better. Right lateral lower leg just above the ankle we identified last  time also looks better. In general the area on his left buttock looks improved. We are using Hydrofera Blue to all wound areas 12/13; right dorsal foot looks better. The area on the right lateral leg is healed. Left dorsal foot has 2 open areas both of which require debridement. The fragile area on the left anterior mid tibial looks better. Smaller area on his buttocks. Were using Hydrofera Blue 1/10; patient comes in with everything looking slightly larger and/or worse. This includes his left  buttock, reopening of the left mid tibia, larger areas on the left dorsal foot and what looks to be [name] cellulitis on the right dorsal and plantar foot. We have been using Hydrofera Blue on all wounds. 1/17; right dorsal foot distally looks better today. The left foot has 2 open wounds that are about the same surrounding erythema. Culture I did last week showed rare Enterococcus and [name] multidrug resistant MRSA. 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 No malignancy they did not stain for fungus 1/24; his right distal foot is not closed dry and scaly but the wound looks like it is contracting. I did not debride anything here. Problem on his left dorsal foot with expanding erythema. Apparently there were problems last week getting the Nuzyra  however it is now available at the [facility] but [name] week later. He is using ketoconazole and Coloplast to the left buttock along with Hydrofera Blue this actually looks quite [name] bit better today. 1/31; right dorsal foot again is dry and scaly but looking to contract. He has been using [name] moisturizer on his feet at my request but he is not sure which 1. The left dorsal foot wounds look about the same there is erythema here that I marked last week however after course of Nuzyra  it certainly is not any better but not any worse either. Finally on his left buttock the skin continues to look better he has the original wound but [name] new substantial area towards the gluteal cleft. Almost like [name] skin tear. I used scissors to remove skin and subcutaneous tissue here silver nitrate and direct pressure 2/7; right dorsal foot. This does not look too much different from last week. Some erythema skin dry and scaly. No debridement. Left dorsal great toe again still not much improvement. I did remove flaking dry skin and callus from around the edge. Finally on his left buttock. The skin is somewhat better in the periwound.  Surface wounds are superficial somewhat better than last week. 01/26/2022: Is [name] little bit of [name] mystery as to why his wounds fail to respond to treatments and actually seem to get worse. This is my first encounter with this patient. He was previously followed by Dr. Rufus. Based upon my review of the chart, it seems that there is [name] little bit of [name] mystery as to why his wounds do not respond as anticipated to the interventions applied and sometimes even get worse. Biopsies have been performed and he was seen by dermatology in [place], but that did not shed any light on the matter. T oday, his gluteal wound is larger, with substantial drainage, rather malodorous. The food wounds are not terrible, but he has [name] lot of callus and scaly skin around these. He is currently getting silver alginate on the gluteal wound, with idodoflex to the feet. He is using lotrisone on his legs for the dry, scaly skin. 02/09/2022: There has really been no change to any of his  wounds. The gluteal wound less drainage and odor, but remains about the same size, the periwound skin remains oddly scaly. His lower extremity wounds also appear roughly the same size. They continue to accumulate [name] small amount of slough. The periwound on his feet and ankle wounds has dry eschar and loose dead skin. We have been using silver alginate on the gluteal wound and Iodoflex on his feet and ankle wounds. T the periwound around his gluteal lesion and Lotrisone on his feet and legs. o 02/23/2022: The right plantar foot wound is closed. The gluteal site looks small but has continued to produce hypertrophic granulation tissue. The foot wounds all look about the same on the dorsal surface of the right foot; on the left, there is only [name] small open area at the site of where his left great toenail would have been. 03/16/2022: The right ankle wound is healed. The right dorsal foot wound is about the same. The left dorsal foot wound is quite [name] bit smaller  and the ischial wound is nearly closed. 03/30/2022: The right ankle wound reopened. Both dorsal foot wounds are quite [name] bit smaller. Unfortunately, he appears to have sheared part of his ischial wound Nakatani, French [name] ([id number]) [id number] Page 35 of 40 open further, perhaps during [name] transfer. 04/13/2022: The right ankle wound has hypertrophic granulation tissue present. The dorsal foot wounds continue to decrease in size. The ischial wound looks about the same today, no better, no worse. 04/27/2022: The right ankle wound has closed. Unfortunately, it looks like some moisture got underneath the dry skin on both of his dorsal feet and these wounds have expanded in size. The ischial wound remains the same with perhaps [name] little bit more slough accumulation than at our previous visit. 05/11/2022: The right ankle wound remains closed. There is [name] left anterior tibial wound that is small has patchy openings with accumulated slough. The dorsum of his right foot appears to be nearly healed with just [name] small punctate opening. The plantar surface of his right foot has [name] new opening that looks like he may have picked some skin there. His sacral ulcer has hypertrophic granulation tissue but has some slough accumulation. The dorsum of his left foot has multiple open areas in [name] fairly ragged distribution. All of these have slough accumulated within them. 06/01/2022: The right ankle and left anterior tibial wound are both closed. Dorsum of his right foot and left foot both look substantially better with just tiny scattered openings The without any slough accumulation. He has sheared open new areas on his left gluteus and ischium. He says that his wheelchair cushion, which is air-filled, has [name] leak and so it keeps deflating. He is awaiting [name] new cushion. 06/15/2022: The right ankle wound has reopened and the fat layer is exposed. Both dorsal feet have just small openings with just [name] little bit of  slough and eschar accumulation. The wound on his left gluteus and ischium is larger again today and has [name] foul odor. 06/29/2022: The right ankle wound has hypertrophic granulation tissue buildup. His dorsal foot wounds both look better with just some eschar on the surface. He has [name] new wound on his left lateral ankle. He is not sure how he acquired it but by appearance, it looks that he hit it on something, potentially his wheelchair or bed. The ischial wound is about the same but is cleaner without any significant purulent drainage or odor. He did not understand what the [facility] call was about  and therefore he does not have the topical compounded antibiotic. 07/13/2022: The right ankle wound again has hypertrophic granulation tissue, but less so than at his previous visit. The ischial wound has improved tremendously the use of the [facility] topical antibiotic. No significant change to the left lateral ankle wound; it is fibrotic with slough present. The skin of both of his feet, especially on the right, has [name] yeasty appearance. 08/10/2022: There is again hypertrophic granulation tissue on the right anterior ankle wound. Both feet are about the same. The left lateral ankle wound is [name] little bit desiccated and has some slough buildup. He unfortunately suffered [name] new injury when he was removing his pants and they caught his bandage which caused [name] large skin tear on his left ischium, just distal to the existing wound. The existing left ischial wound, however, is significantly better with just [name] little light slough on the surface. 08/31/2022: The right anterior ankle wound is [name] lot smaller today underneath some eschar. No accumulation of hypertrophic granulation tissue. The left lateral ankle wound has some slough on the surface but is better in terms of moisture balance this week. The left dorsal foot does not really have any openings on it today. The right dorsal foot has some slough and eschar  accumulation. His gluteal ulcer is basically closed aside from 2 small areas that are oozing [name] bit. 09/21/2022: The right anterior ankle wound is down to just [name] tiny pinhole. The left lateral ankle wound has accumulated slough again, but moisture balance is good. Left and right dorsal feet both have 2 small openings with some slough in them. The gluteal ulcer, unfortunately has opened up substantially. 10/05/2022: The right anterior ankle wound has reopened and has [name] fair amount of slough on the surface. The left lateral ankle wound has accumulated more slough, as well. He was approved for Apligraf, but the wound is not clean enough yet. There is some slough buildup on the dorsal right foot wound, as well as on his ischium. 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 10/26/2022: The right anterior ankle wound has closed again. The left lateral ankle wound looks quite [name] bit better. It is ready for Apligraf but we do not have 1 here for him. His ischium continues to get worse, with the wound larger and deeper. It really seems like this is [name] combination of pressure and friction. He has 2 new wounds, 1 on the plantar aspect of each foot. He says that he had some dry skin there that peeled away. Both are superficial. The dorsum of his left foot does not have any new wound opening. The dorsum of his right foot has [name] bit of slough accumulation. 11/24/2022: The right anterior ankle wound has 2 small open areas, both covered with eschar. The left lateral ankle wound has closed and considerably with just [name] little slough on the surface. The ischium has improved most significantly, having closed in most of the open area with just [name] few small remaining superficial openings. The dorsal foot wounds are small and superficial. The bottoms of his feet are closed. 12/07/2022: The right anterior ankle wound remains open with 2 small areas, again with slough and eschar  present. The left lateral ankle wound is down to about half [name] centimeter with just some slough on the surface. His right dorsal foot has opened up more and has both slough and eschar present. The ischium  has continued to improve and now just has 2 small open areas that are very clean. 12/21/2022: He has [name] new wound on his right lateral ankle. There is some slough present. The left lateral ankle is quite [name] bit smaller with some slough buildup. His left ischial ulcer is also nearly closed; there is just [name] tear in his skin that seems likely to be secondary to [name] transfer mishap. He has [name] new ulcer in his natal cleft that looks like it may be secondary to moisture. There is some slough in this area as well. The right anterior ankle is nearly closed. The right dorsal foot wound has some slough accumulation 01/04/2023: The anterior right ankle wound is closed, as is his ischial ulcer. The left lateral ankle wound is nearly closed with just [name] little bit of slough accumulation. The ulcer in his natal cleft is about the same size with slough buildup there, as well. He has [name] new wound on his left lateral leg near the knee from rubbing on his wheelchair. There is slough present, but no signs of infection. The right dorsal foot wound has [name] fairly heavy layer of slough buildup, as well. 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 He has been using his old wheelchair cushion for reasons that are unclear. The ulcer in his natal cleft has healed. The wound on his left lateral leg near the knee is smaller with some slough present. The right dorsal foot wound has accumulated slough again. The right lateral ankle wound is healed. The left lateral ankle wound is smaller again this week. 02/01/2023: His ischial ulcer has had [name] ton of drainage over the last 2 weeks and the drainage has an absolutely putrid odor. He has gone back to using his new wheelchair chair cushion, though. The wound on his left lateral leg near  his knee has healed. The left lateral ankle wound is nearly healed. Both the dorsal foot wounds are larger and have [name] lot of slough accumulation and drainage. The wound on his right anterior ankle is healed but there is [name] new 1 just proximal to this, also with slough and eschar present. 02/15/2023:The left lateral and right lateral ankle wounds are both smaller. The right anterior ankle wound has some eschar on the surface. Both dorsal feet have [name] lot of slough accumulation. 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 His new [facility] prescription should be delivered tomorrow. We are using silver alginate on everything at this point. 03/08/2023: Both dorsal foot wounds are about the same size and have [name] lot of slough buildup. The ischial ulcer has improved quite [name] bit. The odor has abated and the surfaces are clean. It is still quite friable and 1 area is oozing. The left lateral and right lateral ankle wounds are nearly closed. The right anterior ankle wound is healed. 03/22/2023: The right lateral ankle wound is healed. The left lateral ankle wound is tiny with just [name] little bit of slough accumulation. The dorsal foot wounds look about the same. The ischial ulcer is considerably smaller and there is no odor. There is slight slough accumulation. 04/14/2023: The right lateral ankle wound has reopened. It is fairly superficial but does have some hematoma in the tissue suggesting trauma or pressure as the etiology. The left lateral ankle wound is essentially [name] pinhole under some dry eschar. The dorsal foot wounds are stable. The ischial ulcer,  unfortunately, has expanded to 5 separate openings in his skin. The wounds are clean but friable. 04/28/2023: The left lateral ankle wound has expanded and is [name] little bit deeper today. Dorsal foot wounds are stable. The ischial ulcer is less friable and does not Heinkel, Amil  [name] ([id number]) [id number] Page 36 of 40 have the strong odor that has been present. The right lateral ankle wound is [name] little bit smaller with some slough and eschar accumulation. 05/10/2023: The total area of the ischial wound is smaller. The left lateral ankle wound is nearly closed. Both dorsal foot wounds are quite [name] bit smaller. The right ankle wound has healed. 05/31/2023: The left lateral leg wound is down to just [name] superficial small area that almost looks like [name] scrape rather than the deeper ulcer it has been. His ischial ulcer is down to just 1 open spot. It is clean but quite friable. Both dorsal foot ulcers are considerably smaller and have much less slough than usual. 06/21/2023: His ischial ulcer is still just 1 open area, but it is [name] little larger today. The surrounding skin, however, is in better condition Unfortunately, the left lateral leg wound is [name] little bit larger today. It looks as though some dry skin was peeled away. Both dorsal foot ulcers continue to contract; they are the smallest I have ever seen them and have only minimal slough on the surface. 07/12/2023: All of his wounds have deteriorated and his right anterior ankle ulcer is open again. He has had [name] lot more drainage, although he states it is clear and does not have any significant odor. This is resulted in breakdown around the left lateral ankle ulcer, both dorsal feet and has extended to the plantar surface of both feet. His ischial ulcer is also larger; this appears to be more due to shear than anything else. 08/02/2023: All of his wounds look better. There does not appear to be any rhyme or reason as to why they wax and wane as they do. 08/23/2023: The right dorsal ankle wound is covered with [name] extremely thin layer of skin but is still purple underneath. The left lateral ankle wound has quite [name] bit of slough on it and it appears that the tendon is exposed again. His ischial ulcer is down to 4  open areas, all of which are smaller than previously, but the surfaces remain friable and have some slough on them today. The dorsal foot wounds are both considerably smaller and the right plantar ulcer is closed. The left plantar ulcer is nearly closed. 09/26/2023: The right dorsal ankle wound is basically healed. The left lateral ankle wound is smaller. His ischial ulcers have merged and are [name] bit larger today. Both dorsal foot wounds are stable. The left plantar ulcer is closed but the right plantar ulcer has reopened. 12/3; the patient comes in today for what I gather is monthly follow-up. Unfortunately things are are not going very well. He has multiple wounds including on his right dorsal foot left dorsal foot left lateral lower extremity left posterior lower extremity left buttock and right anterior lower extremity. 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 He has been using mupirocin  silver alginate to all the wounds and his own compression stockings in his legs. I am not sure how much he keeps his legs elevated but he has been coached on this before 11/07/2023:  Apparently the buttock area looks better today than it did at his previous visit. There is no necrosis present. It is still larger than when I saw it in November. The left lateral ankle wound is larger and appears deeper under [name] thick layer of slough. Both feet have deteriorated and the wounds are larger with slough build up. The right anterior ankle wound is open again. 11/22/2023: All of the wounds are looking better today. The lateral left leg wound and left plantar foot wounds have epithelialized significantly. There are fewer open areas on the ischium and these all look healthier. The dorsal foot wounds and left ankle wound have their usual accumulation of slough. Objective Constitutional Slightly hypertensive. no acute  distress. Vitals Time Taken: 7:54 AM, Height: 70 in, Weight: 216 lbs, BMI: 31, Temperature: 98 F, Pulse: 116 bpm, Respiratory Rate: 18 breaths/min, Blood Pressure: 144/79 mmHg. Respiratory Normal work of breathing on room air.. General Notes: 11/22/2023: All of the wounds are looking better today. The lateral left leg wound and left plantar foot wounds have epithelialized significantly. There are fewer open areas on the ischium and these all look healthier. The dorsal foot wounds and left ankle wound have their usual accumulation of slough. Integumentary (Hair, Skin) Wound #52 status is Open. Original cause of wound was Gradually Appeared. The date acquired was: 03/27/2021. The wound has been in treatment 138 weeks. The wound is located on the Right,Dorsal Foot. The wound measures 4.2cm length x 7.5cm width x 0.1cm depth; 24.74cm^2 area and 2.474cm^3 volume. There is Fat Layer (Subcutaneous Tissue) exposed. There is no tunneling or undermining noted. There is [name] large amount of serous drainage noted. The wound margin is distinct with the outline attached to the wound base. There is medium (34-66%) red, pink granulation within the wound bed. There is [name] medium (34-66%) amount of necrotic tissue within the wound bed including Adherent Slough. The periwound skin appearance had no abnormalities noted for texture. The periwound skin appearance had no abnormalities noted for color. The periwound skin appearance exhibited: Maceration. The periwound skin appearance did not exhibit: Dry/Scaly. Periwound temperature was noted as No Abnormality. Wound #56 status is Open. Original cause of wound was Gradually Appeared. The date acquired was: 07/11/2021. The wound has been in treatment 122 weeks. The wound is located on the Left,Dorsal Foot. The wound measures 4.5cm length x 3.5cm width x 0.1cm depth; 12.37cm^2 area and 1.237cm^3 volume. There is Fat Layer (Subcutaneous Tissue) exposed. There is no tunneling or  undermining noted. There is [name] large amount of serous drainage noted. The wound margin is flat and intact. There is small (1-33%) pink granulation within the wound bed. There is [name] large (67-100%) amount of necrotic tissue within the wound bed including Adherent Slough. The periwound skin appearance exhibited: Excoriation, Maceration, Rubor. The periwound skin appearance did not exhibit: Dry/Scaly, Erythema. Periwound temperature was noted as No Abnormality. Wound #67 status is Open. Original cause of wound was Gradually Appeared. The date acquired was: 06/22/2022. The wound has been in treatment 73 weeks. The wound is located on the Left,Lateral Lower Leg. The wound measures 2cm length x 1cm width x 0.1cm depth; 1.571cm^2 area and 0.157cm^3 volume. There is Fat Layer (Subcutaneous Tissue) exposed. There is no tunneling or undermining noted. There is [name] medium amount of serous drainage noted. The wound margin is distinct with the outline attached to the wound base. There is small (1-33%) red, friable granulation within the wound bed. There is [name] large (67- 100%) amount  of necrotic tissue within the wound bed including Adherent Slough. The periwound skin appearance had no abnormalities noted for color. The periwound skin appearance exhibited: Scarring, Dry/Scaly. Periwound temperature was noted as No Abnormality. Wound #74 status is Open. Original cause of wound was Pressure Injury. The date acquired was: 01/10/2023. The wound has been in treatment 44 weeks. The wound is located on the Left,Posterior Upper Leg. The wound measures 11.5cm length x 6.2cm width x 0.1cm depth; 55.999cm^2 area and 5.6cm^3 volume. There is Fat Layer (Subcutaneous Tissue) exposed. There is no tunneling or undermining noted. There is [name] medium amount of serosanguineous drainage noted. The wound margin is flat and intact. There is medium (34-66%) red, friable granulation within the wound bed. There is [name] medium (34-66%) amount of  necrotic tissue within the wound bed including Adherent Slough. The periwound skin appearance had no abnormalities noted for moisture. The periwound skin appearance had no abnormalities noted for color. The periwound skin appearance exhibited: Scarring. Periwound temperature was noted as No Abnormality. Duvall, Osmel [name] ([id number]) [id number] Page 37 of 40 Wound #78 status is Open. Original cause of wound was Gradually Appeared. The date acquired was: 07/04/2023. The wound has been in treatment 19 weeks. The wound is located on the Right,Anterior Ankle. The wound measures 1.8cm length x 1.8cm width x 0.1cm depth; 2.545cm^2 area and 0.254cm^3 volume. There is Fat Layer (Subcutaneous Tissue) exposed. There is no tunneling or undermining noted. There is [name] small amount of serosanguineous drainage noted. The wound margin is flat and intact. There is large (67-100%) red, friable granulation within the wound bed. There is [name] small (1-33%) amount of necrotic tissue within the wound bed including Adherent Slough. The periwound skin appearance had no abnormalities noted for moisture. The periwound skin appearance had no abnormalities noted for color. The periwound skin appearance exhibited: Scarring. Periwound temperature was noted as No Abnormality. Wound #81 status is Open. Original cause of wound was Gradually Appeared. The date acquired was: 09/23/2023. The wound has been in treatment 8 weeks. The wound is located on the Right,Plantar Foot. The wound measures 1.2cm length x 2.8cm width x 0.1cm depth; 2.639cm^2 area and 0.264cm^3 volume. There is Fat Layer (Subcutaneous Tissue) exposed. There is no tunneling or undermining noted. There is [name] medium amount of serous drainage noted. The wound margin is flat and intact. There is small (1-33%) pink granulation within the wound bed. There is [name] large (67-100%) amount of necrotic tissue within the wound bed including Adherent Slough. The  periwound skin appearance had no abnormalities noted for texture. The periwound skin appearance had no abnormalities noted for color. The periwound skin appearance exhibited: Maceration. Periwound temperature was noted as No Abnormality. Wound #82 status is Open. Original cause of wound was Pressure Injury. The date acquired was: 11/04/2023. The wound has been in treatment 2 weeks. The wound is located on the Left,Proximal,Lateral Lower Leg. The wound measures 1.4cm length x 2.5cm width x 0.1cm depth; 2.749cm^2 area and 0.275cm^3 volume. There is Fat Layer (Subcutaneous Tissue) exposed. There is no tunneling or undermining noted. There is [name] medium amount of serosanguineous drainage noted. The wound margin is indistinct and nonvisible. There is small (1-33%) pink granulation within the wound bed. There is [name] large (67-100%) amount of necrotic tissue within the wound bed including Eschar and Adherent Slough. The periwound skin appearance had no abnormalities noted for texture. The periwound skin appearance had no abnormalities noted for moisture. The periwound skin appearance had no abnormalities noted for color.  Periwound temperature was noted as No Abnormality. 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 There was [name] Excisional Skin/Subcutaneous Tissue Debridement with [name] total area of 24.73 sq cm performed by [name] Nest, MD. With the following instrument(s): Curette to remove Viable and Non-Viable tissue/material. Material removed includes Subcutaneous Tissue and Slough and. No specimens were taken. [name] time out was conducted at 08:25, prior to the start of the procedure. [name] Minimum amount of bleeding was controlled with Pressure. The procedure was tolerated well. Post Debridement Measurements: 4.2cm length x 7.5cm width x 0.1cm depth; 2.474cm^3 volume. Character of Wound/Ulcer Post Debridement is improved. Severity of Tissue Post Debridement is: Fat layer exposed. 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 There was [name] Excisional Skin/Subcutaneous Tissue Debridement with [name] total area of 12.36 sq cm performed by [name] Nest, MD. With the following instrument(s): Curette to remove Viable and Non-Viable tissue/material. Material removed includes Subcutaneous Tissue and Slough and after achieving pain control using Lidocaine  4% T opical Solution. No specimens were taken. [name] time out was conducted at 08:25, prior to the start of the procedure. [name] Minimum amount of bleeding was controlled with Pressure. The procedure was tolerated well. Post Debridement Measurements: 4.5cm length x 3.5cm width x 0.1cm depth; 1.237cm^3 volume. Character of Wound/Ulcer Post Debridement is improved. Post procedure Diagnosis Wound #56: Same as Pre-Procedure Wound #67 Pre-procedure diagnosis of Wound #67 is [name] Venous Leg Ulcer located on the Left,Lateral Lower Leg .Severity of Tissue Pre Debridement is: Fat layer exposed. There was [name] Excisional Skin/Subcutaneous Tissue Debridement with [name] total area of 1.57 sq cm performed by [name] Nest, MD. With the following instrument(s): Curette to remove Viable and Non-Viable tissue/material. Material removed includes Subcutaneous Tissue and Slough and after  achieving pain control using Lidocaine  4% T opical Solution. No specimens were taken. [name] time out was conducted at 08:25, prior to the start of the procedure. [name] Minimum amount of bleeding was controlled with Pressure. The procedure was tolerated well. Post Debridement Measurements: 2cm length x 1cm width x 0.1cm depth; 0.157cm^3 volume. Character of Wound/Ulcer Post Debridement is improved. Severity of Tissue Post Debridement is: Fat layer exposed. Post procedure Diagnosis Wound #67: Same as Pre-Procedure Wound #74 Pre-procedure diagnosis of Wound #74 is [name] Pressure Ulcer located on the Left,Posterior Upper Leg . There was [name] Excisional Skin/Subcutaneous Tissue Debridement with [name] total area of 55.97 sq cm performed by [name] Nest, MD. With the following instrument(s): Curette to remove Viable and Non-Viable tissue/material. Material removed includes Subcutaneous Tissue and Slough and after achieving pain control using Lidocaine  4% T opical Solution. No specimens were taken. [name] time out was conducted at 08:25, prior to the start of the procedure. [name] Minimum amount of bleeding was controlled with Pressure. The procedure was tolerated well. Post Debridement Measurements: 11.5cm length x 6.2cm width x 0.1cm depth; 5.6cm^3  volume. Post debridement Stage noted as Category/Stage III. Character of Wound/Ulcer Post Debridement is improved. Post procedure Diagnosis Wound #74: Same as Pre-Procedure Wound #78 Pre-procedure diagnosis of Wound #78 is [name] Venous Leg Ulcer located on the Right,Anterior Ankle .Severity of Tissue Pre Debridement is: Fat layer exposed. Earwood, [place] [name] ([id number]) [id number] Page 38 of 40 There was [name] Excisional Skin/Subcutaneous Tissue Debridement with [name] total area of 2.54 sq cm performed by [name] Nest, MD. With the following instrument(s): Curette to remove Viable and Non-Viable tissue/material. Material removed includes Subcutaneous Tissue and Slough  and after achieving pain control using Lidocaine  4% T opical Solution. No specimens were taken. [name] time out was conducted at 08:25, prior to the start of the procedure. [name] Minimum amount of bleeding was controlled with Pressure. The procedure was tolerated well. Post Debridement Measurements: 1.8cm length x 1.8cm width x 0.1cm depth; 0.254cm^3 volume. Character of Wound/Ulcer Post Debridement is improved. Severity of Tissue Post Debridement is: Fat layer exposed. Post procedure Diagnosis Wound #78: Same as Pre-Procedure Wound #81 Pre-procedure diagnosis of Wound #81 is [name] Lymphedema located on the Right,Plantar Foot . There was [name] Excisional Skin/Subcutaneous Tissue Debridement with [name] total area of 2.64 sq cm performed by [name] Nest, MD. With the following instrument(s): Curette to remove Viable and Non-Viable tissue/material. Material removed includes Subcutaneous Tissue and Slough and after achieving pain control using Lidocaine  4% T opical Solution. No specimens were taken. [name] time out was conducted at 08:25, prior to the start of the procedure. [name] Minimum amount of bleeding was controlled with Pressure. The procedure was tolerated well. Post Debridement Measurements: 1.2cm length x 2.8cm width x 0.1cm depth; 0.264cm^3 volume. Character of Wound/Ulcer Post Debridement is improved. Post procedure Diagnosis Wound #81: Same as Pre-Procedure Wound #82 Pre-procedure diagnosis of Wound #82 is [name] Pressure Ulcer located on the Left,Proximal,Lateral Lower Leg . There was [name] Excisional Skin/Subcutaneous Tissue Debridement with [name] total area of 2.75 sq cm performed by [name] Nest, MD. With the following instrument(s): Curette to remove Viable and Non-Viable tissue/material. Material removed includes Subcutaneous Tissue and Slough and after achieving pain control using Lidocaine  4% T opical Solution. No specimens were taken. [name] time out was conducted at 08:25, prior to the start of the procedure. [name] Minimum  amount of bleeding was controlled with Pressure. The procedure was tolerated well. Post Debridement Measurements: 1.4cm length x 2.5cm width x 0.1cm depth; 0.275cm^3 volume. Post debridement Stage noted as Unstageable/Unclassified. Character of Wound/Ulcer Post Debridement is improved. Post procedure Diagnosis Wound #82: Same as Pre-Procedure Plan Follow-up Appointments: Return Appointment in 2 weeks. - Dr. Marolyn ***allow extra time*** Anesthetic: Wound #52 Right,Dorsal Foot: (In clinic) Topical Lidocaine  4% applied to wound bed Bathing/ Shower/ Hygiene: May shower and wash wound with soap and water. - on days that dressing is changed Off-Loading: Roho cushion for wheelchair - use newer cushion Turn and reposition every 2 hours - lift up with arms in wheelchair every hour during the day WOUND #52: - Foot Wound Laterality: Dorsal, Right Cleanser: Soap and Water Every Other Day/30 Days Discharge Instructions: May shower and wash wound with dial antibacterial soap and water prior to dressing change. Cleanser: Wound Cleanser Every Other Day/30 Days Discharge Instructions: Cleanse the wound with wound cleanser prior to applying [name] clean dressing using gauze sponges, not tissue or cotton balls. Peri-Wound Care: Sween Lotion (Moisturizing lotion) Every Other Day/30 Days Discharge Instructions: Apply Aquaphor moisturizing lotion as directed Topical: Mupirocin  Ointment Every Other Day/30 Days Discharge Instructions: Apply  Mupirocin  (Bactroban ) as instructed Prim Dressing: Maxorb Extra Ag+ Alginate Dressing, 2x2 (in/in) Every Other Day/30 Days ary Discharge Instructions: Apply to wound bed as instructed Secondary Dressing: Woven Gauze Sponge, Non-Sterile 4x4 in Every Other Day/30 Days Discharge Instructions: Apply over primary dressing as directed. Secured With: [employer], 4.5x3.1 (in/yd) (Generic) Every Other Day/30 Days Discharge Instructions: Secure with Kerlix as directed. Secured  With: 60M Medipore H Soft Cloth Surgical T ape, 4 x 10 (in/yd) (Generic) Every Other Day/30 Days Discharge Instructions: Secure with tape as directed. WOUND #56: - Foot Wound Laterality: Dorsal, Left Cleanser: Soap and Water Every Other Day/30 Days Discharge Instructions: May shower and wash wound with dial antibacterial soap and water prior to dressing change. Cleanser: Wound Cleanser Every Other Day/30 Days Discharge Instructions: Cleanse the wound with wound cleanser prior to applying [name] clean dressing using gauze sponges, not tissue or cotton balls. Peri-Wound Care: Sween Lotion (Moisturizing lotion) Every Other Day/30 Days Discharge Instructions: Apply Aquaphor moisturizing lotion as directed Topical: Mupirocin  Ointment Every Other Day/30 Days Discharge Instructions: Apply Mupirocin  (Bactroban ) as instructed Prim Dressing: Maxorb Extra Ag+ Alginate Dressing, 2x2 (in/in) Every Other Day/30 Days ary Discharge Instructions: Apply to wound bed as instructed Secondary Dressing: Woven Gauze Sponge, Non-Sterile 4x4 in Every Other Day/30 Days Discharge Instructions: Apply over primary dressing as directed. Secured With: [employer], 4.5x3.1 (in/yd) (Generic) Every Other Day/30 Days Discharge Instructions: Secure with Kerlix as directed. Secured With: 60M Medipore H Soft Cloth Surgical T ape, 4 x 10 (in/yd) (Generic) Every Other Day/30 Days Discharge Instructions: Secure with tape as directed. WOUND #67: - Lower Leg Wound Laterality: Left, Lateral Cleanser: Soap and Water Every Other Day/30 Days Discharge Instructions: May shower and wash wound with dial antibacterial soap and water prior to dressing change. Cleanser: Wound Cleanser Every Other Day/30 Days Discharge Instructions: Cleanse the wound with wound cleanser prior to applying [name] clean dressing using gauze sponges, not tissue or cotton balls. Peri-Wound Care: Sween Lotion (Moisturizing lotion) Every Other Day/30 Days Discharge  Instructions: Apply Aquaphor moisturizing lotion as directed Topical: Mupirocin  Ointment Every Other Day/30 Days Discharge Instructions: Apply Mupirocin  (Bactroban ) as instructed Prim Dressing: Maxorb Extra Ag+ Alginate Dressing, 2x2 (in/in) Every Other Day/30 Days ary Discharge Instructions: Apply to wound bed as instructed Balfour, Emad [name] ([id number]) [id number] Page 39 of 40 Secondary Dressing: Woven Gauze Sponge, Non-Sterile 4x4 in Every Other Day/30 Days Discharge Instructions: Apply over primary dressing as directed. Secured With: [employer], 4.5x3.1 (in/yd) (Generic) Every Other Day/30 Days Discharge Instructions: Secure with Kerlix as directed. Secured With: 60M Medipore H Soft Cloth Surgical T ape, 4 x 10 (in/yd) (Generic) Every Other Day/30 Days Discharge Instructions: Secure with tape as directed. WOUND #74: - Upper Leg Wound Laterality: Left, Posterior Cleanser: Vashe 5.8 (oz) 1 x Per Day/30 Days Discharge Instructions: Cleanse the wound with Vashe prior to applying [name] clean dressing using gauze sponges , let sit on wound for 10 minutes Peri-Wound Care: Zinc Oxide Ointment 30g tube 1 x Per Day/30 Days Discharge Instructions: Apply Zinc Oxide to periwound with each dressing change as needed fpr moisture Topical: Mupirocin  Ointment 1 x Per Day/30 Days Discharge Instructions: Apply Mupirocin  (Bactroban ) as instructed Topical: Keystone antibiotic compound 1 x Per Day/30 Days Prim Dressing: Maxorb Extra Ag+ Alginate Dressing, 4x4.75 (in/in) 1 x Per Day/30 Days ary Discharge Instructions: Apply to wound bed as instructed Secondary Dressing: Zetuvit Plus Silicone Border Sacrum Dressing, Sm, 7x7 (in/in) 1 x Per Day/30 Days Discharge Instructions: Apply  silicone border over primary dressing as directed. Secured With: 36M Medipore H Soft Cloth Surgical T ape, 4 x 10 (in/yd) 1 x Per Day/30 Days Discharge Instructions: Secure with tape as directed. WOUND  #78: - Ankle Wound Laterality: Right, Anterior Cleanser: Soap and Water Every Other Day/30 Days Discharge Instructions: May shower and wash wound with dial antibacterial soap and water prior to dressing change. Cleanser: Wound Cleanser Every Other Day/30 Days Discharge Instructions: Cleanse the wound with wound cleanser prior to applying [name] clean dressing using gauze sponges, not tissue or cotton balls. Peri-Wound Care: Sween Lotion (Moisturizing lotion) Every Other Day/30 Days Discharge Instructions: Apply Aquaphor moisturizing lotion as directed Topical: Mupirocin  Ointment Every Other Day/30 Days Discharge Instructions: Apply Mupirocin  (Bactroban ) as instructed Prim Dressing: Maxorb Extra Ag+ Alginate Dressing, 2x2 (in/in) Every Other Day/30 Days ary Discharge Instructions: Apply to wound bed as instructed Secondary Dressing: Woven Gauze Sponge, Non-Sterile 4x4 in Every Other Day/30 Days Discharge Instructions: Apply over primary dressing as directed. Secured With: [employer], 4.5x3.1 (in/yd) (Generic) Every Other Day/30 Days Discharge Instructions: Secure with Kerlix as directed. Secured With: 36M Medipore H Soft Cloth Surgical T ape, 4 x 10 (in/yd) (Generic) Every Other Day/30 Days Discharge Instructions: Secure with tape as directed. WOUND #81: - Foot Wound Laterality: Plantar, Right Cleanser: Soap and Water Every Other Day/30 Days Discharge Instructions: May shower and wash wound with dial antibacterial soap and water prior to dressing change. Cleanser: Wound Cleanser Every Other Day/30 Days Discharge Instructions: Cleanse the wound with wound cleanser prior to applying [name] clean dressing using gauze sponges, not tissue or cotton balls. Peri-Wound Care: Sween Lotion (Moisturizing lotion) Every Other Day/30 Days Discharge Instructions: Apply Aquaphor moisturizing lotion as directed Topical: Mupirocin  Ointment Every Other Day/30 Days Discharge Instructions: Apply Mupirocin   (Bactroban ) as instructed Prim Dressing: Maxorb Extra Ag+ Alginate Dressing, 2x2 (in/in) Every Other Day/30 Days ary Discharge Instructions: Apply to wound bed as instructed Secondary Dressing: Woven Gauze Sponge, Non-Sterile 4x4 in Every Other Day/30 Days Discharge Instructions: Apply over primary dressing as directed. Secured With: [employer], 4.5x3.1 (in/yd) (Generic) Every Other Day/30 Days Discharge Instructions: Secure with Kerlix as directed. Secured With: 36M Medipore H Soft Cloth Surgical T ape, 4 x 10 (in/yd) (Generic) Every Other Day/30 Days Discharge Instructions: Secure with tape as directed. WOUND #82: - Lower Leg Wound Laterality: Left, Lateral, Proximal Cleanser: Soap and Water Every Other Day/30 Days Discharge Instructions: May shower and wash wound with dial antibacterial soap and water prior to dressing change. Cleanser: Wound Cleanser Every Other Day/30 Days Discharge Instructions: Cleanse the wound with wound cleanser prior to applying [name] clean dressing using gauze sponges, not tissue or cotton balls. Peri-Wound Care: Sween Lotion (Moisturizing lotion) Every Other Day/30 Days Discharge Instructions: Apply Aquaphor moisturizing lotion as directed Topical: Mupirocin  Ointment Every Other Day/30 Days Discharge Instructions: Apply Mupirocin  (Bactroban ) as instructed Prim Dressing: Maxorb Extra Ag+ Alginate Dressing, 2x2 (in/in) Every Other Day/30 Days ary Discharge Instructions: Apply to wound bed as instructed Secondary Dressing: Woven Gauze Sponge, Non-Sterile 4x4 in Every Other Day/30 Days Discharge Instructions: Apply over primary dressing as directed. Secured With: [employer], 4.5x3.1 (in/yd) (Generic) Every Other Day/30 Days Discharge Instructions: Secure with Kerlix as directed. Secured With: 36M Medipore H Soft Cloth Surgical T ape, 4 x 10 (in/yd) (Generic) Every Other Day/30 Days Discharge Instructions: Secure with tape as directed. 11/22/2023: All  of the wounds are looking better today. The lateral left leg wound and left plantar foot wounds  have epithelialized significantly. There are fewer open areas on the ischium and these all look healthier. The dorsal foot wounds and left ankle wound have their usual accumulation of slough. I used [name] curette to debride slough and subcutaneous tissue from all of the open wounds. We will continue to use topical mupirocin  ointment with silver alginate to all sites. He is doing [name] better job of keeping his legs strapped together, which is why I think the left lateral leg wound is improving. Follow-up in 2 weeks. Electronic Signature(s) Signed: 11/25/2023 1:00:54 PM By: Drury Nestle RN, BSN Signed: 11/28/2023 8:37:20 AM By: [name] Nest MD FACS Previous Signature: 11/22/2023 10:09:16 AM Version By: [name] Nest MD FACS Entered By: Drury Nestle on 11/25/2023 09:59:16 Fons, [name] [name] ([id number]) [id number] Page 40 of 40 -------------------------------------------------------------------------------- SuperBill Details Patient Name: Date of Service: Jorge, [name] [name] 11/22/2023 Medical Record Number: [id number] Patient Account Number: [id number] Date of Birth/Sex: Treating RN: 1986-11-21 (37 y.o. 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 G82.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,  complete Facility Procedures : CPT4 Code: 63899987 Description: 11042 - DEB SUBQ TISSUE 20 SQ CM/< ICD-10 Diagnosis Description L97.522 Non-pressure chronic ulcer of other part of left foot with fat layer exposed L97.512 Non-pressure chronic ulcer of other part of right foot with fat layer exposed  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, stage 3 L97.822 Non-pressure chronic ulcer of other part of left lower leg with fat layer expo Modifier: sed Quantity: 1 : CPT4 Code: 63899981 Description: 11045 - DEB SUBQ TISS EA ADDL 20CM ICD-10 Diagnosis Description 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 L97.822 Non-pressure chronic  ulcer of other part of left lower leg with fat layer expo 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, stage 3 Modifier: sed Quantity: 5 Physician Procedures : CPT4 Code Description Modifier [id number] 11042 - WC PHYS SUBQ TISS 20 SQ CM ICD-10 Diagnosis Description L97.522 Non-pressure chronic ulcer of other part of left foot with fat layer exposed L97.512 Non-pressure chronic ulcer of other part of right foot  with fat layer exposed 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, stage 3 L97.822 Non-pressure chronic ulcer of other part of left lower leg with fat layer exposed Quantity: 1 : [id number] 11045 - WC PHYS SUBQ TISS EA ADDL 20 CM ICD-10 Diagnosis Description 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 L97.822 Non-pressure chronic  ulcer of other part of left lower leg with fat layer exposed 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

## 2023-11-29 ENCOUNTER — Ambulatory Visit: Payer: BC Managed Care – PPO | Admitting: Behavioral Health

## 2023-11-29 ENCOUNTER — Encounter: Payer: Self-pay | Admitting: Behavioral Health

## 2023-11-29 DIAGNOSIS — F341 Dysthymic disorder: Secondary | ICD-10-CM | POA: Diagnosis not present

## 2023-11-29 MED ORDER — SERTRALINE HCL 100 MG PO TABS: ORAL_TABLET | ORAL | 1 refills | Status: DC

## 2023-11-29 NOTE — Progress Notes (Signed)
 [facility]  Patient ID: [name],  MRN: [id number]  PCP: [name] Pock, PA-C  Date of Evaluation: 11/29/2023 Time spent:30 minutes  Chief Complaint:  Chief Complaint   Anxiety; Depression; Follow-up; Medication Refill; Patient Education     HISTORY/CURRENT STATUS: HPI [name], 38 year old male presents to this office for follow up and medication management. No psychosocial changes this visit. Says everything is going well. He is requesting no medication changes  this visit and would like to continue to monitor. He reports 1/10 depression, and 0/10 anxiety. He sleeps 7 plus hours per night. Denies any significant social changes. He is wheelchair bound and has non healing wounds on both legs bilaterally. He goes to wound care once weekly. No changes in his social life. He denies mania, no psychosis. No SI/HI.    No prior psychiatric medication failures  Individual Medical History/ Review of Systems: Changes? :No   Allergies: Sulfa antibiotics, Levofloxacin, Meropenem , Penicillins, and Zyvox [linezolid]  Current Medications:  Current Outpatient Medications:    Amino Acids-Protein Hydrolys (FEEDING SUPPLEMENT, PRO-STAT SUGAR FREE 64,) LIQD, Take 30 mLs by mouth 2 (two) times daily., Disp: , Rfl:    Ascorbic Acid (VITAMIN C) 1000 MG tablet, Take 1,000 mg by mouth daily., Disp: , Rfl:    baclofen  (LIORESAL ) 20 MG tablet, Take 40-60 mg by mouth 3 (three) times daily. 60mg  in the morning, 40mg  in the afternoon, and 60mg  in the evening, Disp: , Rfl:    imipramine  (TOFRANIL ) 50 MG tablet, Take 50 mg by mouth at bedtime. , Disp: , Rfl:    lisinopril  (ZESTRIL ) 10 MG tablet, Take 10 mg by mouth daily., Disp: , Rfl:    Multiple Vitamin (MULTIVITAMIN) tablet, Take 1 tablet by mouth daily., Disp: , Rfl:    mupirocin  ointment (BACTROBAN ) 2 %, Apply topically daily., Disp: , Rfl:    oxybutynin  (DITROPAN  XL) 15 MG 24 hr tablet, Take 30 mg by mouth at bedtime. , Disp: , Rfl:    zinc  sulfate 220 (50 Zn) MG capsule, Take 220 mg by mouth daily., Disp: , Rfl:    Melatonin 10 MG CAPS, Take by mouth. Nightly, Disp: , Rfl:    sertraline  (ZOLOFT ) 100 MG tablet, TAKE 1 AND 1/2 TABLETS(150 MG) BY MOUTH DAILY, Disp: 135 tablet, Rfl: 1 Medication Side Effects: none  Family Medical/ Social History: Changes? No  MENTAL HEALTH EXAM:  There were no vitals taken for this visit.There is no height or weight on file to calculate BMI.  General Appearance: Casual, Neat, and Well Groomed  Eye Contact:  Good  Speech:  Clear and Coherent  Volume:  Normal  Mood:  NA  Affect:  Appropriate  Thought Process:  Coherent  Orientation:  Full (Time, Place, and Person)  Thought Content: Logical   Suicidal Thoughts:  No  Homicidal Thoughts:  No  Memory:  WNL  Judgement:  Good  Insight:  Good  Psychomotor Activity:  Normal  Concentration:  Concentration: Good  Recall:  Good  Fund of Knowledge: Good  Language: Good  Assets:  Desire for Improvement  ADL's:  Intact  Cognition: WNL  Prognosis:  Good    DIAGNOSES:    ICD-10-CM   1. Persistent depressive disorder with melancholic features, currently mild  F34.1 sertraline  (ZOLOFT ) 100 MG tablet      Receiving Psychotherapy: No    RECOMMENDATIONS:   Greater than 50% of  30 min face to face time with patient was spent on counseling and coordination of  care.  He is smiling and upbeat. No social changes. He continues to enjoy great stability. Has strong family support.  He is very happy with how the increase of Zoloft  has been working to reduce his anxiety and depression.  Will continue Zoloft  to 150 mg  daily.  Will report worsening symptoms promptly Will follow up in 3 months to reassess Provided emergency contact information Reviewed PDMP     [name] DELENA Pizza, NP

## 2023-12-06 ENCOUNTER — Encounter (HOSPITAL_BASED_OUTPATIENT_CLINIC_OR_DEPARTMENT_OTHER): Payer: BC Managed Care – PPO | Attending: General Surgery | Admitting: General Surgery

## 2023-12-06 DIAGNOSIS — L97322 Non-pressure chronic ulcer of left ankle with fat layer exposed: Secondary | ICD-10-CM | POA: Insufficient documentation

## 2023-12-06 DIAGNOSIS — L97822 Non-pressure chronic ulcer of other part of left lower leg with fat layer exposed: Secondary | ICD-10-CM | POA: Diagnosis not present

## 2023-12-06 DIAGNOSIS — G8220 Paraplegia, unspecified: Secondary | ICD-10-CM | POA: Diagnosis not present

## 2023-12-06 DIAGNOSIS — L97312 Non-pressure chronic ulcer of right ankle with fat layer exposed: Secondary | ICD-10-CM | POA: Diagnosis not present

## 2023-12-06 DIAGNOSIS — G8221 Paraplegia, complete: Secondary | ICD-10-CM | POA: Diagnosis not present

## 2023-12-06 DIAGNOSIS — L97512 Non-pressure chronic ulcer of other part of right foot with fat layer exposed: Secondary | ICD-10-CM | POA: Diagnosis not present

## 2023-12-06 DIAGNOSIS — I890 Lymphedema, not elsewhere classified: Secondary | ICD-10-CM | POA: Diagnosis not present

## 2023-12-06 DIAGNOSIS — I10 Essential (primary) hypertension: Secondary | ICD-10-CM | POA: Diagnosis not present

## 2023-12-06 DIAGNOSIS — L97522 Non-pressure chronic ulcer of other part of left foot with fat layer exposed: Secondary | ICD-10-CM | POA: Insufficient documentation

## 2023-12-06 DIAGNOSIS — I872 Venous insufficiency (chronic) (peripheral): Secondary | ICD-10-CM | POA: Diagnosis not present

## 2023-12-06 DIAGNOSIS — L89323 Pressure ulcer of left buttock, stage 3: Secondary | ICD-10-CM | POA: Insufficient documentation

## 2023-12-06 DIAGNOSIS — G4730 Sleep apnea, unspecified: Secondary | ICD-10-CM | POA: Diagnosis not present

## 2023-12-08 NOTE — Progress Notes (Signed)
 Hagger, Lavante [name] ([id number]) [id number].pdf Page 1 of 15 Visit Report for 12/06/2023 Arrival Information Details Patient Name: Date of Service: [name], [name] 12/06/2023 7:45 [name] Medical Record Number: [id number] Patient Account Number: [id number] Date of Birth/Sex: Treating RN: [birth year] (37 y.o. [name] Merleen Handing Primary Care Arneta Mahmood: [name], [name] Other Clinician: Referring Tateanna Bach: Treating Jahkeem Kurka/Extender: [name] Delon [name] GLENIS [name]: 413 Visit Information History Since Last Visit Added or deleted any medications: No Patient Arrived: Wheel Chair Any new allergies or adverse reactions: No Arrival Time: 07:44 Had [name] fall or experienced change in No Accompanied By: self activities of daily living that may affect Transfer Assistance: None risk of falls: Patient Identification Verified: Yes Signs or symptoms of abuse/neglect since last visito No Secondary Verification Process Completed: Yes Hospitalized since last visit: No Patient Requires Transmission-Based Precautions: No Implantable device outside of the clinic excluding No Patient Has Alerts: Yes cellular tissue based products placed in the center Patient Alerts: R ABI = 1.0 since last visit: L ABI = 1.1 Has Dressing in Place as Prescribed: Yes Has Compression in Place as Prescribed: Yes Pain Present Now: No Electronic Signature(s) Signed: 12/06/2023 5:09:31 PM By: Merleen Handing RN, BSN Entered By: Merleen Handing on 12/06/2023 07:50:21 -------------------------------------------------------------------------------- Lower Extremity Assessment Details Patient Name: Date of Service: [name], [name] 12/06/2023 7:45 [name] Medical Record Number: [id number] Patient Account Number: [id number] Date of Birth/Sex: Treating RN: May 22, 1986 (37 y.o. [name] Merleen Handing Primary Care Reinhart Saulters: [name], [name] Other Clinician: Referring Merideth Bosque: Treating Maylie Ashton/Extender: [name] Delon [name], [name] [name]: 413 Edema Assessment Assessed: [Left: No] [Right: No] Edema: [Left: Yes] [Right: Yes] Calf Left: Right: Point of Measurement: 33 cm From Medial Instep 30.5 cm 33.5 cm Ankle Left: Right: Point of Measurement: 10 cm From Medial Instep 25 cm 25.5 cm Vascular Assessment Pulses: Dorsalis Pedis Palpable: [Left:Yes] [Right:Yes] Prindiville, Jayquan [name] ([id number]) [Right:[id number] Page 2 of 15] Extremity colors, hair growth, and conditions: Extremity Color: [Left:Hyperpigmented] [Right:Normal] Hair Growth on Extremity: [Left:Yes] [Right:Yes] Temperature of Extremity: [Left:Warm < 3 seconds] [Right:Warm < 3 seconds] Electronic Signature(s) Signed: 12/06/2023 5:09:31 PM By: Merleen Handing RN, BSN Entered By: Merleen Handing on 12/06/2023 08:06:38 -------------------------------------------------------------------------------- Multi Wound Chart Details Patient Name: Date of Service: [name], [name] 12/06/2023 7:45 [name] Medical Record Number: [id number] Patient Account Number: [id number] Date of Birth/Sex: Treating RN: [birth year] (37 y.o. 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 Primary Etiology: Sleep Apnea, Hypertension, Paraplegia Sleep Apnea, Hypertension, Paraplegia Sleep Apnea, Hypertension, Paraplegia Comorbid History: 03/27/2021 07/11/2021 06/22/2022 Date Acquired: 140 124 75 Weeks of  Treatment: Open Open Open Wound Status: No No No Wound Recurrence: No Yes No Clustered Wound: [name] [name] [name] Clustered Quantity: 2.2x6.2x0.1 5x3x0.1 2.3x1.8x0.1 Measurements L x W x D (cm) 10.713 11.781 3.252 [name] (cm) : rea 1.071 1.178 0.325 Volume (cm) : -468.30% -89.40% 42.00% % Reduction in [name] rea: -469.70% -89.40% 42.10% % Reduction in Volume: Full Thickness Without Exposed Full Thickness Without Exposed Full Thickness Without Exposed Classification: Support Structures Support Structures Support Structures Medium Medium Medium Exudate [name] mount: Purulent Purulent Serosanguineous Exudate Type: yellow, brown, green  yellow, brown, green red, brown Exudate Color: Distinct, outline attached Flat and Intact Distinct, outline attached Wound Margin: Small (1-33%) Small (1-33%) None Present (0%) Granulation Amount: Pink Pink [name] Granulation Quality: Large (67-100%) Large (67-100%) Large (67-100%) Necrotic Amount: Adherent Slough Adherent [facility] Adherent Slough Necrotic Tissue: Fat Layer (Subcutaneous Tissue): Yes Fat Layer (Subcutaneous Tissue): Yes Fat Layer (Subcutaneous Tissue): Yes Exposed Structures: Fascia: No Fascia: No Fascia: No Tendon: No Tendon: No Tendon: No Muscle: No Muscle: No Muscle: No Joint: No Joint: No Joint: No Bone: No Bone: No Bone: No Small (1-33%) Small (1-33%) None Epithelialization: Debridement - Excisional Debridement - Excisional Debridement - Excisional Debridement: Widen, Huriel [name] ([id number]) [id number] Page 3 of 15 08:25 08:25 08:25 Pre-procedure Verification/Time Out Taken: Lidocaine  4% Topical Solution Lidocaine  4% Topical Solution Lidocaine  4% Topical Solution Pain Control: Subcutaneous, Slough Subcutaneous, Slough Subcutaneous, Slough Tissue Debrided: Skin/Subcutaneous Tissue Skin/Subcutaneous Tissue Skin/Subcutaneous Tissue Level: 10.71 11.78 3.25 Debridement [name] (sq cm): rea Curette Curette  Curette Instrument: Minimum Minimum Minimum Bleeding: Pressure Pressure Pressure Hemostasis [name] chieved: Insensate Insensate Insensate Procedural Pain: Insensate Insensate Insensate Post Procedural Pain: Procedure was tolerated well Procedure was tolerated well Procedure was tolerated well Debridement Treatment Response: 2.2x6.2x0.1 5x3x0.1 2.3x1.8x0.1 Post Debridement Measurements L x W x D (cm) 1.071 1.178 0.325 Post Debridement Volume: (cm) [name] [name] [name] Post Debridement Stage: No Abnormalities Noted Excoriation: Yes Scarring: Yes Periwound Skin Texture: Maceration: No Maceration: Yes Dry/Scaly: Yes Periwound Skin Moisture: Dry/Scaly: No Dry/Scaly: No Rubor: Yes Rubor: Yes Rubor: Yes Periwound Skin Color: Erythema: No Erythema: No No Abnormality No Abnormality No Abnormality Temperature: Debridement Debridement Debridement Procedures Performed: Wound Number: [id number] Photos: Left, Posterior Upper Leg Right, Anterior Ankle Right, Plantar Foot Wound Location: Pressure Injury Gradually Appeared Gradually Appeared Wounding Event: Pressure Ulcer Venous Leg Ulcer Lymphedema Primary Etiology: Sleep Apnea, Hypertension, Paraplegia Sleep Apnea, Hypertension, Paraplegia Sleep Apnea, Hypertension, Paraplegia Comorbid History: 01/10/2023 07/04/2023 09/23/2023 Date Acquired: 46 21 10 Weeks of Treatment: Open Open Open Wound Status: No No No Wound Recurrence: Yes No No Clustered Wound: 3 [name] [name] Clustered Quantity: 12.5x4x0.1 0.9x1.3x0.1 2.7x4.3x0.1 Measurements L x W x D (cm) 39.27 0.919 9.118 [name] (cm) : rea 3.927 0.092 0.912 Volume (cm) : 47.40% 49.10% -1557.80% % Reduction in [name] rea: 47.40% 49.20% -1558.20% % Reduction in Volume: Category/Stage III Full Thickness Without Exposed Full Thickness Without Exposed Classification: Support Structures Support Structures Medium Small Medium Exudate [name] mount: Serosanguineous Serosanguineous Serous Exudate Type: red,  brown red, brown amber Exudate Color: Flat and Intact Flat and Intact Flat and Intact Wound Margin: Large (67-100%) None Present (0%) Large (67-100%) Granulation [name] mount: Red, Friable [name] Pink Granulation Quality: Small (1-33%) Large (67-100%) Small (1-33%) Necrotic [name] mount: Adherent Slough Eschar, Adherent Slough Adherent Slough Necrotic Tissue: Fat Layer (Subcutaneous Tissue): Yes Fat Layer (Subcutaneous Tissue): Yes Fat Layer (Subcutaneous Tissue): Yes Exposed Structures: Fascia: No Fascia: No Fascia: No Tendon: No Tendon: No Tendon: No Muscle: No Muscle: No Muscle: No Joint: No Joint: No Joint: No Bone: No Bone: No Bone: No Medium (34-66%) Small (1-33%) Medium (34-66%) Epithelialization: Debridement - Excisional Debridement - Excisional Debridement - Excisional Debridement: Pre-procedure Verification/Time Out 08:25 08:25 08:25 Taken: Lidocaine  4% Topical Solution Lidocaine  4% Topical Solution Lidocaine  4% Topical Solution Pain Control: Subcutaneous, Slough Subcutaneous, Slough Subcutaneous, Slough Tissue Debrided: Skin/Subcutaneous Tissue Skin/Subcutaneous Tissue Skin/Subcutaneous Tissue Level: 21.1 0.92 9.11 Debridement [name] (sq cm): rea Curette Curette Curette Instrument: Minimum Minimum Minimum Bleeding: Pressure Pressure Pressure Hemostasis [name] chieved: Insensate Insensate Insensate Procedural  Pain: Insensate Insensate Insensate Post Procedural Pain: Procedure was tolerated well Procedure was tolerated well Procedure was tolerated well Debridement Treatment Response: 12.5x4.3x0.1 0.9x1.3x0.1 2.7x4.3x0.1 Post Debridement Measurements L x W x D (cm) 4.222 0.092 0.912 Post Debridement Volume: (cm) Category/Stage III [name] [name] Post Debridement Stage: Scarring: Yes Scarring: Yes No Abnormalities Noted Periwound Skin Texture: Sterry, Dale [name] ([id number]) [id number] Page 4 of 15 Dry/Scaly: Yes No Abnormalities Noted Maceration:  Yes Periwound Skin Moisture: Ecchymosis: Yes No Abnormalities Noted Rubor: Yes Periwound Skin Color: No Abnormality No Abnormality No Abnormality Temperature: Debridement Debridement Debridement Procedures Performed: Wound Number: 33 [name] [name] Photos: [name] [name] Left, Proximal, Lateral Lower Leg [name] [name] Wound Location: Pressure Injury [name] [name] Wounding Event: Pressure Ulcer [name] [name] Primary Etiology: Sleep Apnea, Hypertension, Paraplegia [name] [name] Comorbid History: 11/04/2023 [name] [name] Date Acquired: 4 [name] [name] Weeks of Treatment: Open [name] [name] Wound Status: No [name] [name] Wound Recurrence: No [name] [name] Clustered Wound: [name] [name] [name] Clustered Quantity: 0x0x0 [name] [name] Measurements L x W x D (cm) 0 [name] [name] [name] (cm) : rea 0 [name] [name] Volume (cm) : 100.00% [name] [name] % Reduction in [name] rea: 100.00% [name] [name] % Reduction in Volume: Unstageable/Unclassified [name] [name] Classification: None Present [name] [name] Exudate [name] mount: [name] [name] [name] Exudate Type: [name] [name] [name] Exudate Color: [name] [name] [name] Wound Margin: None Present (0%) [name] [name] Granulation [name] mount: [name] [name] [name] Granulation Quality: None Present (0%) [name] [name] Necrotic [name] mount: [name] [name] [name] Necrotic Tissue: Fascia: No [name] [name] Exposed Structures: Fat Layer (Subcutaneous Tissue): No Tendon: No Muscle: No Joint: No Bone: No Large (67-100%) [name] [name] Epithelialization: [name] [name] [name] Debridement: [name] [name] [name] Pain Control: [name] [name] [name] Tissue Debrided: [name] [name] [name] Level: [name] [name] [name] Debridement [name] (sq cm): rea [name] [name] [name] Instrument: [name] [name] [name] Bleeding: [name] [name] [name] Hemostasis [name] chieved: [name] [name] [name] Procedural Pain: [name] [name] [name] Post Procedural Pain: Debridement Treatment Response: [name] [name] [name] Post Debridement Measurements L x [name] [name] [name] W x D (cm) [name] [name] [name] Post Debridement Volume: (cm) [name] [name] [name] Post Debridement Stage: No Abnormalities Noted [name] [name] Periwound Skin Texture: No Abnormalities Noted [name] [name] Periwound Skin  Moisture: No Abnormalities Noted [name] [name] Periwound Skin Color: No Abnormality [name] [name] Temperature: [name] [name] [name] Procedures Performed: Treatment Notes Electronic Signature(s) Signed: 12/06/2023 8:37:17 AM By: [name] Nest MD FACS Signed: 12/06/2023 5:09:31 PM By: Merleen Handing RN, BSN Entered By: [name] Nest on 12/06/2023 08:37:16 [name], [name] ([id number]) [id number] Page 5 of 15 -------------------------------------------------------------------------------- Multi-Disciplinary Care Plan Details Patient Name: Date of Service: [name], [name] 12/06/2023 7:45 [name] Medical Record Number: [id number] Patient Account Number: [id number] Date of Birth/Sex: Treating RN: Mar 07, 1986 (37 y.o. [name] Merleen Handing Primary Care Yanique Mulvihill: [name], [name] Other Clinician: Referring Kassidi Elza: Treating Aerilyn Slee/Extender: [name] Nest [name] GLENIS [name]: 413 Multidisciplinary Care Plan reviewed with physician Active Inactive Pressure Nursing Diagnoses: Knowledge deficit related to management of pressures ulcers Potential for impaired tissue integrity related to pressure, friction, moisture, and shear Goals: Patient will remain free from development of additional pressure ulcers Date Initiated: 06/21/2018 Date Inactivated: 02/12/2019 Target Resolution Date: 02/16/2019 Unmet Reason: pressure ulcer Goal Status: Unmet reopened on left ischium Patient/caregiver will verbalize risk factors for pressure ulcer development Date Initiated: 06/21/2018 Date Inactivated: 05/28/2019 Target Resolution Date: 06/01/2019 Goal Status: Met Patient/caregiver will verbalize understanding of pressure ulcer management Date Initiated: 06/01/2022 Target Resolution Date: 01/10/2024 Goal Status: Active Interventions: Assess: immobility, friction,  shearing, incontinence upon admission and as needed Assess offloading mechanisms upon admission and as needed Assess potential for  pressure ulcer upon admission and as needed Treatment Activities: Pressure reduction/relief device ordered : 12/05/2017 Notes: Venous Leg Ulcer Nursing Diagnoses: Knowledge deficit related to disease process and management Potential for venous Insuffiency (use before diagnosis confirmed) Goals: Non-invasive venous studies are completed as ordered Date Initiated: 01/05/2016 Date Inactivated: 09/15/2017 Target Resolution Date: 01/20/2017 Goal Status: Met Patient will maintain optimal edema control Date Initiated: 01/05/2016 Target Resolution Date: 01/10/2024 Goal Status: Active Patient/caregiver will verbalize understanding of disease process and disease management Date Initiated: 01/05/2016 Date Inactivated: 09/15/2017 Target Resolution Date: 01/20/2017 Goal Status: Met Verify adequate tissue perfusion prior to therapeutic compression application Date Initiated: 01/05/2016 Date Inactivated: 09/15/2017 Target Resolution Date: 01/20/2017 Goal Status: Met Interventions: Assess peripheral edema status every visit. Compression as ordered Provide education on venous insufficiency Treatment Activities: Non-invasive vascular studies : 03/11/2016 Therapeutic compression applied : 03/11/2016 Sharpnack, Avenir [name] ([id number]) [id number] Page 6 of 15 Notes: Wound/Skin Impairment Nursing Diagnoses: Impaired tissue integrity Knowledge deficit related to ulceration/compromised skin integrity Goals: Patient/caregiver will verbalize understanding of skin care regimen Date Initiated: 06/01/2022 Target Resolution Date: 01/10/2024 Goal Status: Active Ulcer/skin breakdown will have [name] volume reduction of 30% by week 4 Date Initiated: 06/01/2022 Date Inactivated: 06/29/2022 Target Resolution Date: 06/29/2022 Unmet Reason: complex wounds, Goal Status: Unmet infection Interventions: Assess patient/caregiver ability to obtain necessary supplies Assess ulceration(s) every visit Provide  education on ulcer and skin care Notes: 02/02/21: Complex Care, ongoing. Electronic Signature(s) Signed: 12/06/2023 5:09:31 PM By: Merleen Handing RN, BSN Entered By: Boehlein, Linda on 12/06/2023 07:41:41 -------------------------------------------------------------------------------- Pain Assessment Details Patient Name: Date of Service: [name], [name] 12/06/2023 7:45 [name] Medical Record Number: [id number] Patient Account Number: [id number] Date of Birth/Sex: Treating RN: Feb 25, 1986 (37 y.o. [name] Merleen Handing Primary Care Johnmichael Melhorn: [name], [name] Other Clinician: Referring Elianie Hubers: Treating Najai Waszak/Extender: [name] Delon [name] GLENIS [name]: [phone] Active Problems Location of Pain Severity and Description of Pain Patient Has Paino No Site Locations Rate the pain. Current Pain Level: 0 Pain Management and Medication Current Pain Management: Electronic Signature(s) [name], [name] ([id number]) [id number] Page 7 of 15 Signed: 12/06/2023 5:09:31 PM By: Merleen Handing RN, BSN Entered By: Boehlein, Linda on 12/06/2023 07:52:45 -------------------------------------------------------------------------------- Patient/Caregiver Education Details Patient Name: Date of Service: Current, [name] 1/14/2025andnbsp7:45 [name] Medical Record Number: [id number] Patient Account Number: [id number] Date of Birth/Gender: Treating RN: 1986/11/02 (37 y.o. [name] Merleen Handing Primary Care Physician: [name] GLENIS Other Clinician: Referring Physician: Treating Physician/Extender: [name] Delon [name] GLENIS [name]: [phone] Education Assessment Education Provided To: Patient Education Topics Provided Pressure: Methods: Explain/Verbal Responses: Reinforcements needed, State content correctly Venous: Methods: Explain/Verbal Responses: Reinforcements needed, State content correctly Wound/Skin Impairment: Methods: Explain/Verbal Responses:  Reinforcements needed, State content correctly Electronic Signature(s) Signed: 12/06/2023 5:09:31 PM By: Merleen Handing RN, BSN Entered By: Merleen Handing on 12/06/2023 07:42:13 -------------------------------------------------------------------------------- Wound Assessment Details Patient Name: Date of Service: [name], [name] 12/06/2023 7:45 [name] Medical Record Number: [id number] Patient Account Number: [id number] Date of Birth/Sex: Treating RN: 19-Sep-1986 (37 y.o. [name] Merleen Handing Primary Care Ari Bernabei: [name], [name] Other Clinician: Referring Odester Nilson: Treating Yojan Paskett/Extender: [name] Delon [name], [name] [name]: 413 Wound Status Wound Number: [id number] Primary Etiology: Venous Leg Ulcer Wound Location: Right, Dorsal Foot Wound Status: Open Wounding Event: Gradually Appeared Comorbid History: Sleep Apnea, Hypertension, Paraplegia Date Acquired: 03/27/2021 Weeks Of Treatment: 140 Clustered Wound: No Photos Gibbon, Kathryn [name] (  [id number]) [id number] Page 8 of 15 Wound Measurements Length: (cm) 2.2 Width: (cm) 6.2 Depth: (cm) 0.1 Area: (cm) 10.713 Volume: (cm) 1.071 % Reduction in Area: -468.3% % Reduction in Volume: -469.7% Epithelialization: Small (1-33%) Tunneling: No Undermining: No Wound Description Classification: Full Thickness Without Exposed Suppor Wound Margin: Distinct, outline attached Exudate Amount: Medium Exudate Type: Purulent Exudate Color: yellow, brown, green t Structures Foul Odor After Cleansing: No Slough/Fibrino Yes Wound Bed Granulation Amount: Small (1-33%) Exposed Structure Granulation Quality: Pink Fascia Exposed: No Necrotic Amount: Large (67-100%) Fat Layer (Subcutaneous Tissue) Exposed: Yes Necrotic Quality: Adherent Slough Tendon Exposed: No Muscle Exposed: No Joint Exposed: No Bone Exposed: No Periwound Skin Texture Texture Color No Abnormalities Noted: Yes No Abnormalities Noted: No Rubor:  Yes Moisture No Abnormalities Noted: No Temperature / Pain Dry / Scaly: No Temperature: No Abnormality Maceration: No Electronic Signature(s) Signed: 12/06/2023 5:09:31 PM By: Merleen Handing RN, BSN Entered By: Merleen Handing on 12/06/2023 08:13:38 -------------------------------------------------------------------------------- Wound Assessment Details Patient Name: Date of Service: [name], [name] 12/06/2023 7:45 [name] Medical Record Number: [id number] Patient Account Number: [id number] Date of Birth/Sex: Treating RN: 1986/01/20 (37 y.o. [name] Merleen Handing Primary Care Aleenah Homen: [name], [name] Other Clinician: Referring Sitara Cashwell: Treating Yusra Ravert/Extender: [name] Delon BRIDGE, [name] [name]: 413 Wound Status Wound Number: [id number] Primary Etiology: Neuropathic Ulcer-Non Diabetic Wound Location: Left, Dorsal Foot Wound Status: Open Wounding Event: Gradually Appeared Comorbid History: Sleep Apnea, Hypertension, Paraplegia Date Acquired: 07/11/2021 Weeks Of Treatment: 124 Clustered Wound: Yes [name], [name] ([id number]) [id number] Page 9 of 15 Photos Wound Measurements Length: (cm) 5 Width: (cm) 3 Depth: (cm) 0.1 Area: (cm) 11.781 Volume: (cm) 1.178 % Reduction in Area: -89.4% % Reduction in Volume: -89.4% Epithelialization: Small (1-33%) Tunneling: No Undermining: No Wound Description Classification: Full Thickness Without Exposed Suppor Wound Margin: Flat and Intact Exudate Amount: Medium Exudate Type: Purulent Exudate Color: yellow, brown, green t Structures Foul Odor After Cleansing: No Slough/Fibrino Yes Wound Bed Granulation Amount: Small (1-33%) Exposed Structure Granulation Quality: Pink Fascia Exposed: No Necrotic Amount: Large (67-100%) Fat Layer (Subcutaneous Tissue) Exposed: Yes Necrotic Quality: Adherent Slough Tendon Exposed: No Muscle Exposed: No Joint Exposed: No Bone Exposed: No Periwound Skin Texture Texture  Color No Abnormalities Noted: No No Abnormalities Noted: No Excoriation: Yes Erythema: No Rubor: Yes Moisture No Abnormalities Noted: No Temperature / Pain Dry / Scaly: No Temperature: No Abnormality Maceration: Yes Electronic Signature(s) Signed: 12/06/2023 5:09:31 PM By: Merleen Handing RN, BSN Entered By: Merleen Handing on 12/06/2023 08:14:21 -------------------------------------------------------------------------------- Wound Assessment Details Patient Name: Date of Service: Wadding, [name] 12/06/2023 7:45 [name] Medical Record Number: [id number] Patient Account Number: [id number] Date of Birth/Sex: Treating RN: [birth year] (37 y.o. [name] Merleen Handing Primary Care Talvin Christianson: [name], [name] Other Clinician: Referring Saman Giddens: Treating Idan Prime/Extender: [name] Delon BRIDGE, [name] [name]: 413 Wound Status Wound Number: [id number] Primary Etiology: Venous Leg Ulcer Wound Location: Left, Lateral Lower Leg Wound Status: Open Wounding Event: Gradually Appeared Comorbid History: Sleep Apnea, Hypertension, Paraplegia Date Acquired: 06/22/2022 Weeks Of Treatment: 75 Impastato, Demonie [name] ([id number]) [id number] Page 10 of 15 Clustered Wound: No Photos Wound Measurements Length: (cm) 2.3 Width: (cm) 1.8 Depth: (cm) 0.1 Area: (cm) 3.252 Volume: (cm) 0.325 % Reduction in Area: 42% % Reduction in Volume: 42.1% Epithelialization: None Tunneling: No Undermining: No Wound Description Classification: Full Thickness Without Exposed Support Structures Wound Margin: Distinct, outline attached Exudate Amount: Medium Exudate Type: Serosanguineous Exudate Color: red, brown Foul Odor After Cleansing: No Slough/Fibrino No Wound Bed Granulation  Amount: None Present (0%) Exposed Structure Necrotic Amount: Large (67-100%) Fascia Exposed: No Necrotic Quality: Adherent Slough Fat Layer (Subcutaneous Tissue) Exposed: Yes Tendon Exposed: No Muscle Exposed:  No Joint Exposed: No Bone Exposed: No Periwound Skin Texture Texture Color No Abnormalities Noted: No No Abnormalities Noted: No Scarring: Yes Erythema: No Rubor: Yes Moisture No Abnormalities Noted: No Temperature / Pain Dry / Scaly: Yes Temperature: No Abnormality Electronic Signature(s) Signed: 12/06/2023 5:09:31 PM By: Merleen Handing RN, BSN Entered By: Merleen Handing on 12/06/2023 08:15:16 -------------------------------------------------------------------------------- Wound Assessment Details Patient Name: Date of Service: [name], [name] 12/06/2023 7:45 [name] Medical Record Number: [id number] Patient Account Number: [id number] Date of Birth/Sex: Treating RN: 28-Jun-1986 (37 y.o. [name] Merleen Handing Primary Care Mida Cory: [name], [name] Other Clinician: Referring Norita Meigs: Treating Saadia Dewitt/Extender: [name] Delon BRIDGE, [name] [name]: 413 Wound Status Wound Number: [id number] Primary Etiology: Pressure Ulcer Wound Location: Left, Posterior Upper Leg Wound Status: Open Wounding Event: Pressure Injury Comorbid History: Sleep Apnea, Hypertension, Paraplegia Date Acquired: 01/10/2023 Almanzar, Aragorn [name] ([id number]) [id number] Page 11 of 15 Weeks Of Treatment: 46 Clustered Wound: Yes Photos Wound Measurements Length: (cm) Width: (cm) Depth: (cm) Clustered Quantity: Area: (cm) Volume: (cm) 12.5 % Reduction in Area: 47.4% 4 % Reduction in Volume: 47.4% 0.1 Epithelialization: Medium (34-66%) 3 Tunneling: No 39.27 Undermining: No 3.927 Wound Description Classification: Category/Stage III Wound Margin: Flat and Intact Exudate Amount: Medium Exudate Type: Serosanguineous Exudate Color: red, brown Foul Odor After Cleansing: No Slough/Fibrino No Wound Bed Granulation Amount: Large (67-100%) Exposed Structure Granulation Quality: Red, Friable Fascia Exposed: No Necrotic Amount: Small (1-33%) Fat Layer (Subcutaneous Tissue) Exposed:  Yes Necrotic Quality: Adherent Slough Tendon Exposed: No Muscle Exposed: No Joint Exposed: No Bone Exposed: No Periwound Skin Texture Texture Color No Abnormalities Noted: No No Abnormalities Noted: Yes Scarring: Yes Temperature / Pain Temperature: No Abnormality Moisture No Abnormalities Noted: Yes Electronic Signature(s) Signed: 12/06/2023 5:09:31 PM By: Merleen Handing RN, BSN Entered By: Merleen Handing on 12/06/2023 08:23:31 -------------------------------------------------------------------------------- Wound Assessment Details Patient Name: Date of Service: Takeda, [name] 12/06/2023 7:45 [name] Medical Record Number: [id number] Patient Account Number: [id number] Date of Birth/Sex: Treating RN: September 09, 1986 (37 y.o. [name] Merleen Handing Primary Care Houston Zapien: [name], [name] Other Clinician: Referring Letcher Schweikert: Treating Gale Hulse/Extender: [name] Delon BRIDGE, [name] [name]: 413 Wound Status Wound Number: [id number] Primary Etiology: Venous Leg Ulcer Wound Location: Right, Anterior Ankle Wound Status: Open Bitton, Jermain [name] ([id number]) [id number] Page 12 of 15 Wounding Event: Gradually Appeared Comorbid History: Sleep Apnea, Hypertension, Paraplegia Date Acquired: 07/04/2023 Weeks Of Treatment: 21 Clustered Wound: No Photos Wound Measurements Length: (cm) 0.9 Width: (cm) 1.3 Depth: (cm) 0.1 Area: (cm) 0.919 Volume: (cm) 0.092 % Reduction in Area: 49.1% % Reduction in Volume: 49.2% Epithelialization: Small (1-33%) Tunneling: No Undermining: No Wound Description Classification: Full Thickness Without Exposed Suppor Wound Margin: Flat and Intact Exudate Amount: Small Exudate Type: Serosanguineous Exudate Color: red, brown t Structures Foul Odor After Cleansing: No Slough/Fibrino No Wound Bed Granulation Amount: None Present (0%) Exposed Structure Necrotic Amount: Large (67-100%) Fascia Exposed: No Necrotic Quality: Eschar, Adherent  Slough Fat Layer (Subcutaneous Tissue) Exposed: Yes Tendon Exposed: No Muscle Exposed: No Joint Exposed: No Bone Exposed: No Periwound Skin Texture Texture Color No Abnormalities Noted: No No Abnormalities Noted: Yes Scarring: Yes Temperature / Pain Temperature: No Abnormality Moisture No Abnormalities Noted: Yes Electronic Signature(s) Signed: 12/06/2023 5:09:31 PM By: Merleen Handing RN, BSN Entered By: Merleen Handing on 12/06/2023 08:16:06 -------------------------------------------------------------------------------- Wound Assessment Details Patient Name:  Date of Service: Glab, [name] 12/06/2023 7:45 [name] Medical Record Number: [id number] Patient Account Number: [id number] Date of Birth/Sex: Treating RN: 09/22/1986 (37 y.o. [name] Merleen Handing Primary Care Lillymae Duet: [name], [name] Other Clinician: Referring Strider Vallance: Treating Angeleena Dueitt/Extender: [name] Delon BRIDGE, [name] [name]: 413 Wound Status Wound Number: [id number] Primary Etiology: Lymphedema Wound Location: Right, Plantar Foot Wound Status: Open Vonseggern, Laurin [name] ([id number]) [id number] Page 13 of 15 Wounding Event: Gradually Appeared Comorbid History: Sleep Apnea, Hypertension, Paraplegia Date Acquired: 09/23/2023 Weeks Of Treatment: 10 Clustered Wound: No Photos Wound Measurements Length: (cm) 2.7 Width: (cm) 4.3 Depth: (cm) 0.1 Area: (cm) 9.118 Volume: (cm) 0.912 % Reduction in Area: -1557.8% % Reduction in Volume: -1558.2% Epithelialization: Medium (34-66%) Tunneling: No Undermining: No Wound Description Classification: Full Thickness Without Exposed Suppor Wound Margin: Flat and Intact Exudate Amount: Medium Exudate Type: Serous Exudate Color: amber t Structures Foul Odor After Cleansing: No Slough/Fibrino Yes Wound Bed Granulation Amount: Large (67-100%) Exposed Structure Granulation Quality: Pink Fascia Exposed: No Necrotic Amount: Small (1-33%) Fat Layer  (Subcutaneous Tissue) Exposed: Yes Necrotic Quality: Adherent Slough Tendon Exposed: No Muscle Exposed: No Joint Exposed: No Bone Exposed: No Periwound Skin Texture Texture Color No Abnormalities Noted: Yes No Abnormalities Noted: No Rubor: Yes Moisture No Abnormalities Noted: No Temperature / Pain Maceration: Yes Temperature: No Abnormality Electronic Signature(s) Signed: 12/06/2023 5:09:31 PM By: Merleen Handing RN, BSN Entered By: Merleen Handing on 12/06/2023 08:16:56 -------------------------------------------------------------------------------- Wound Assessment Details Patient Name: Date of Service: Hehir, [name] 12/06/2023 7:45 [name] Medical Record Number: [id number] Patient Account Number: [id number] Date of Birth/Sex: Treating RN: 1986-03-05 (37 y.o. [name] Merleen Handing Primary Care Fitzhugh Vizcarrondo: [name], [name] Other Clinician: Referring Tashiya Souders: Treating Abbegale Stehle/Extender: [name] Delon BRIDGE, [name] [name]: 413 Wound Status Wound Number: [id number] Primary Etiology: Pressure Ulcer Wound Location: Left, Proximal, Lateral Lower Leg Wound Status: Open Kings, Garv [name] ([id number]) [id number] Page 14 of 15 Wounding Event: Pressure Injury Comorbid History: Sleep Apnea, Hypertension, Paraplegia Date Acquired: 11/04/2023 Weeks Of Treatment: 4 Clustered Wound: No Photos Wound Measurements Length: (cm) Width: (cm) Depth: (cm) Area: (cm) Volume: (cm) 0 % Reduction in Area: 100% 0 % Reduction in Volume: 100% 0 Epithelialization: Large (67-100%) 0 Tunneling: No 0 Undermining: No Wound Description Classification: Unstageable/Unclassified Exudate Amount: None Present Foul Odor After Cleansing: No Slough/Fibrino No Wound Bed Granulation Amount: None Present (0%) Exposed Structure Necrotic Amount: None Present (0%) Fascia Exposed: No Fat Layer (Subcutaneous Tissue) Exposed: No Tendon Exposed: No Muscle Exposed: No Joint Exposed:  No Bone Exposed: No Periwound Skin Texture Texture Color No Abnormalities Noted: Yes No Abnormalities Noted: Yes Moisture Temperature / Pain No Abnormalities Noted: Yes Temperature: No Abnormality Electronic Signature(s) Signed: 12/06/2023 5:09:31 PM By: Merleen Handing RN, BSN Entered By: Merleen Handing on 12/06/2023 08:17:46 -------------------------------------------------------------------------------- Vitals Details Patient Name: Date of Service: Hem, [name] 12/06/2023 7:45 [name] Medical Record Number: [id number] Patient Account Number: [id number] Date of Birth/Sex: Treating RN: [birth year] (37 y.o. [name] Merleen Handing Primary Care Juda Lajeunesse: [name], [name] Other Clinician: Referring Lakoda Mcanany: Treating Tamerra Merkley/Extender: [name] Delon BRIDGE, [name] [name]: 413 Vital Signs Time Taken: 07:50 Temperature (F): 98.3 Height (in): 70 Pulse (bpm): 125 Weight (lbs): 216 Respiratory Rate (breaths/min): 18 Body Mass Index (BMI): 31 Blood Pressure (mmHg): 136/79 Mathey, Keyden [name] ([id number]) [id number] Page 15 of 15 Reference Range: 80 - 120 mg / dl Electronic Signature(s) Signed: 12/06/2023 5:09:31 PM By: Boehlein, Linda RN, BSN Entered By: Boehlein, Linda on 12/06/2023 07:52:32

## 2023-12-08 NOTE — Progress Notes (Signed)
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 the right second toe which looks as though it's progressing towards closure 10/28/16; the wounds on his toes appear to be closed. No major change in the left lateral leg wound although the surface looks somewhat better using Iodoflex. He has had previous arterial studies that were normal. He has had reflux studies and is status post ablation although I don't have any exact notes on which vein was ablated. I'll need to check the surgical record 11/04/16; he's had a reopening between the first and second toe on the left and right. No major change in the left lateral leg wound. There is what appears to be cellulitis of the left dorsal foot 11/18/16 the patient was hospitalized initially in [place] and then subsequently transferred to [facility] and was admitted there from 11/09/16 through 11/12/16. He had developed progressive cellulitis on the right leg in spite of the doxycycline  I gave him. I'd spoken to the  hospitalist in [place] who was concerned about continuing leukocytosis. CT scan is what I suggested this was done which showed soft tissue swelling without evidence of osteomyelitis or an underlying abscess blood cultures were negative. At [facility] he was treated with vancomycin  and Primaxin  and then add an infectious disease consult. He was transitioned to Ceftaroline . He has been making progressive improvement. Overall a severe cellulitis of the right leg. He is been using silver alginate to her original wound on the left leg. The wounds in his toes on the right are closed there is a small open area on the base of the left second toe 11/26/15; the patient's right leg is much better although there is still some edema here this could be reminiscent from his severe cellulitis likely on top of some degree of lymphedema. His left anterior leg wound has less surface slough as reported by her intake nurse. Small wound at the base of the left second toe 12/02/16; patient's right leg is better and there is no open wound here. His left anterior lateral leg wound continues to have a healthy-looking surface. Small wound at the base of the left second toe however there is erythema in the left forefoot which is worrisome 12/16/16; is no open wounds on his right leg. We took measurements for stockings. His left anterior lateral leg wound continues to have a healthy-looking surface. I'm not sure where we were with the Apligraf run through his insurance. We have been using Iodoflex. He has a thick eschar on the left first second toe interface, I suspect this may be fungal however there is no visible open 12/23/16; no open wound on his right leg. He has 2 small areas left of the linear wound that was remaining last week. We have been using Prisma, I thought I have disclosed this week, we can only look forward to next week 01/03/17; the patient had concerning areas of erythema last week, already on doxycycline  for UTI  through his primary doctor. The erythema is absolutely no better there is warmth and swelling both medially from the left lateral leg wound and also the dorsal left foot. 01/06/17- Patient is here for follow-up evaluation of his left lateral leg ulcer and bilateral feet ulcers. He is on oral antibiotic therapy, tolerating that. Nursing staff and the patient states that the erythema is improved from Monday. 01/13/17; the predominant left lateral leg wound  continues to be problematic. I had put Apligraf on him earlier this month once. However he subsequently developed what appeared to be an intense cellulitis around the left lateral leg wound. I gave him Dalvance  I think on 2/12 perhaps 2/13 he continues on cefdinir . The erythema is still present but the warmth and swelling is improved. I am hopeful that the cellulitis part of this control. I wouldn't be surprised if there is an element of venous inflammation as well. 01/17/17. The erythema is present but better in the left leg. His left lateral leg wound still does not have a viable surface buttons certain parts of this long thin wound it appears like there has been improvement in dimensions. 01/20/17; the erythema still present but much better in the left leg. I'm thinking this is his usual degree of chronic venous inflammation. The wound on the left leg looks somewhat better. Is less surface slough 01/27/17; erythema is back to the chronic venous inflammation. The wound on the left leg is somewhat better. I am back to the point where I like to try an Apligraf once again 02/10/17; slight improvement in wound dimensions. Apligraf #2. He is completing his doxycycline  02/14/17; patient arrives today having completed doxycycline  last Thursday. This was supposed to be a nurse visit however once again he hasn't tense erythema from the medial part of his wound extending over the lower leg. Also erythema in his foot this is roughly in the same distribution as last  time. He has baseline chronic venous inflammation however this is a lot worse than the baseline I have learned to accept the on him is baseline inflammation 02/24/17- patient is here for follow-up evaluation. He is tolerating compression therapy. His voicing no complaints or concerns he is here anticipating an Apligraf 03/03/17; he arrives today with an adherent necrotic surface. I don't think this is surface is going to be amenable for Apligraf's. The erythema around his wound and on the left dorsal foot has resolved he is off antibiotics 03/10/17; better-looking surface today. I don't think he can tolerate Apligraf's. He tells me he had a wound VAC after a skin graft years ago to this area and they had difficulty with a seal. The erythema continues to be stable around this some degree of chronic venous inflammation but he also has recurrent cellulitis. We have been using Iodoflex 03/17/17; continued improvement in the surface and may be small changes in dimensions. Using Iodoflex which seems the only thing that will control his surface 03/24/17- He is here for follow up evaluation of his LLE lateral ulceration and ulcer to right dorsal foot/toe space. He is voicing no complaints or concerns, He is tolerating compression wrap. 03/31/17 arrives today with a much healthier looking wound on the left lower extremity. We have been using Iodoflex for a prolonged period of time which has for the first time prepared and adequate looking wound bed although we have not had much in the way of wound dimension improvement. He also has a small wound between the first and second toe on the right 04/07/17; arrives today with a healthy-looking wound bed and at least the top 50% of this wound appears to be now her. No debridement was required I have changed him to Hydrofera Blue last week after prolonged Iodoflex. He did not do well with Apligraf's. We've had a re-opening between the first and second toe on the  right 04/14/17; arrives today with a healthier looking wound bed contractions and the top 50% of  this wound and some on the lesser 50%. Wound bed appears healthy. The area between the first and second toe on the right still remains problematic 04/21/17; continued very gradual improvement. Using Hydrofera Blue 04/28/17; continued very gradual improvement in the left lateral leg venous insufficiency wound. His periwound erythema is very mild. We have been using Hydrofera Blue. Wound is making progress especially in the superior 50% 05/05/17; he continues to have very gradual improvement in the left lateral venous insufficiency wound. Both in terms with an length rings are improving. I debrided this every 2 weeks with #5 curet and we have been using Hydrofera Blue and again making good progress With regards to the wounds between his right first and second toe which I thought might of been tinea pedis he is not making as much progress very dry scaly skin over the area. Also the area at the base of the left first and second toe in a similar condition 05/12/17; continued gradual improvement in the refractory left lateral venous insufficiency wound on the left. Dimension smaller. Surface still requiring debridement using Hydrofera Blue 05/19/17; continued gradual improvement in the refractory left lateral venous ulceration. Careful inspection of the wound bed underlying rumination suggested some degree of epithelialization over the surface no debridement indicated. Continue Hydrofera Blue difficult areas between his toes first and third on the left than first and second on the right. I'm going to change to silver alginate from silver collagen. Continue ketoconazole as I suspect underlying tinea pedis 05/26/17; left lateral leg venous insufficiency wound. We've been using Hydrofera Blue. I believe that there is expanding epithelialization over the surface of the wound albeit not coming from the wound circumference.  This is a bit of an odd situation in which the epithelialization seems to be coming from the surface of the wound rather than in the exact circumference. There is still small open areas mostly along the lateral margin of the wound. He has unchanged areas between the left first and second and the right first second toes which I been treating for tenia pedis 06/02/17; left lateral leg venous insufficiency wound. We have been using Hydrofera Blue. Somewhat smaller from the wound circumference. The surface of the wound remains a bit on it almost epithelialized sedation in appearance. I use an open curette today debridement in the surface of all of this especially the edges Small open wounds remaining on the dorsal right first and second toe interspace and the plantar left first second toe and her face on the left 06/09/17; wound on the left lateral leg continues to be smaller but very gradual and very dry surface using Hydrofera Blue 06/16/17 requires weekly debridements now on the left lateral leg although this continues to contract. I changed to silver collagen last week because of dryness Lundin, Oma E ([id number]) [phone].pdf Page 8 of 40 of the wound bed. Using Iodoflex to the areas on his first and second toes/web space bilaterally 06/24/17; patient with history of paraplegia also chronic venous insufficiency with lymphedema. Has a very difficult wound on the left lateral leg. This has been gradually reducing in terms of with but comes in with a very dry adherent surface. High switch to silver collagen a week or so ago with hydrogel to keep the area moist. This is been refractory to multiple dressing attempts. He also has areas in his first and second toes bilaterally in the anterior and posterior web space. I had been using Iodoflex here after a prolonged course of silver alginate  with ketoconazole was ineffective [question tinea pedis] 07/14/17; patient arrives today with a  very difficult adherent material over his left lateral lower leg wound. He also has surrounding erythema and poorly controlled edema. He was switched his Santyl  last visit which the nurses are applying once during his doctor visit and once on a nurse visit. He was also reduced to 2 layer compression I'm not exactly sure of the issue here. 07/21/17; better surface today after 1 week of Iodoflex. Significant cellulitis that we treated last week also better. [Doxycycline ] 07/28/17 better surface today with now 2 weeks of Iodoflex. Significant cellulitis treated with doxycycline . He has now completed the doxycycline  and he is back to his usual degree of chronic venous inflammation/stasis dermatitis. He reminds me he has had ablations surgery here 08/04/17; continued improvement with Iodoflex to the left lateral leg wound in terms of the surface of the wound although the dimensions are better. 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 Problematic areas on the plantar aspect of the first second toe web space on the left and the dorsal aspect of the first second toe web space on the right. At one point I felt these were probably related to chronic fungal infections in treated him aggressively for this although we have not made any improvement here. 08/11/17; left lateral leg. Surface continues to improve with the Iodoflex although we are not seeing much improvement in overall wound dimensions. Areas on his plantar left foot and right foot show no improvement. In fact the right foot looks somewhat worse 08/18/17; left lateral leg. We changed to Hydrofera Blue last week after a prolonged course of Iodoflex which helps get the surface better. It appears that the wound with is improved. Continue with difficult areas on the left dorsal first second and plantar first second on the right 09/01/17; patient arrives in clinic today having had a  temperature of 103 yesterday. He was seen in the ER and [facility]. The patient was concerned he could have cellulitis again in the right leg however they diagnosed him with a UTI and he is now on Keflex . He has a history of cellulitis which is been recurrent and difficult but this is been in the left leg, in the past 5 use doxycycline . He does in and out catheterizations at home which are risk factors for UTI 09/08/17; patient will be completing his Keflex  this weekend. The erythema on the left leg is considerably better. He has a new wound today on the medial part of the right leg small superficial almost looks like a skin tear. He has worsening of the area on the right dorsal first and second toe. His major area on the left lateral leg is better. Using Hydrofera Blue on all areas 09/15/17; gradual reduction in width on the long wound in the left lateral leg. No debridement required. He also has wounds on the plantar aspect of his left first second toe web space and on the dorsal aspect of the right first second toe web space. 09/22/17; there continues to be very gradual improvements in the dimensions of the left lateral leg wound. He hasn't round erythematous spot with might be pressure on his wheelchair. There is no evidence obviously of infection no purulence no warmth He has a dry scaled area on the plantar aspect of the left first second toe Improved area on the dorsal right first second toe. 09/29/17; left lateral leg wound continues to improve  in dimensions mostly with an is still a fairly long but increasingly narrow wound. He has a dry scaled area on the plantar aspect of his left first second toe web space Increasingly concerning area on the dorsal right first second toe. In fact I am concerned today about possible cellulitis around this wound. The areas extending up his second toe and although there is deformities here almost appears to abut on the nailbed. 10/06/17; left lateral leg wound  continues to make very gradual progress. Tissue culture I did from the right first second toe dorsal foot last time grew MRSA and enterococcus which was vancomycin  sensitive. This was not sensitive to clindamycin  or doxycycline . He is allergic to Zyvox and sulfa we have therefore arrange for him to have dalvance  infusion tomorrow. He is had this in the past and tolerated it well 10/20/17; left lateral leg wound continues to make decent progress. This is certainly reduced in terms of with there is advancing epithelialization.The cellulitis in the right foot looks better although he still has a deep wound in the dorsal aspect of the first second toe web space. Plantar left first toe web space on the left I think is making some progress 10/27/17; left lateral leg wound continues to make decent progress. Advancing epithelialization.using Hydrofera Blue The right first second toe web space wound is better-looking using silver alginate Improvement in the left plantar first second toe web space. Again using silver alginate 11/03/17 left lateral leg wound continues to make decent progress albeit slowly. Using Hydrofera Blue The right per second toe web space continues to be a very problematic looking punched out wound. I obtained a piece of tissue for deep culture I did extensively treated this for fungus. It is difficult to imagine that this is a pressure area as the patient states other than going outside he doesn't really wear shoes at home The left plantar first second toe web space looked fairly senescent. Necrotic edges. This required debridement change to [facility] Blue to all wound areas 11/10/17; left lateral leg wound continues to contract. Using Hydrofera Blue On the right dorsal first second toe web space dorsally. Culture I did of this area last week grew MRSA there is not an easy oral option in this patient was multiple antibiotic allergies or intolerances. This was only a rare culture isolate  I'm therefore going to use Bactroban  under silver alginate On the left plantar first second toe web space. Debridement is required here. This is also unchanged 11/17/17; left lateral leg wound continues to contract using Hydrofera Blue this is no longer the major issue. The major concern here is the right first second toe web space. He now has an open area going from dorsally to the plantar aspect. There is now wound on the inner lateral part of the first toe. Not a very viable surface on this. There is erythema spreading medially into the forefoot. No major change in the left first second toe plantar wound 11/24/17; left lateral leg wound continues to contract using Hydrofera Blue. Nice improvement today The right first second toe web space all of this looks a lot less angry than last week. I have given him clindamycin  and topical Bactroban  for MRSA and terbinafine for the possibility of underlining tinea pedis that I could not control with ketoconazole. Looks somewhat better The area on the plantar left first second toe web space is weeping with dried debris around the wound 12/01/17; left lateral leg wound continues to contract he Hydrofera Blue.  It is becoming thinner in terms of with nevertheless it is making good improvement. The right first second toe web space looks less angry but still a large necrotic-looking wounds starting on the plantar aspect of the right foot extending between the toes and now extensively on the base of the right second toe. I gave him clindamycin  and topical Bactroban  for MRSA anterior benefiting for the possibility of underlying tinea pedis. Not looking better today The area on the left first/second toe looks better. Debrided of necrotic debris 12/05/17* the patient was worked in urgently today because over the weekend he found blood on his incontinence bad when he woke up. He was found to have an ulcer by his wife who does most of his wound care. He came in today for us   to look at this. He has not had a history of wounds in his buttocks in spite of his paraplegia. 12/08/17; seen in follow-up today at his usual appointment. He was seen earlier this week and found to have a new wound on his buttock. We also follow him for wounds on the left lateral leg, left first second toe web space and right first second toe web space 12/15/17; we have been using Hydrofera Blue to the left lateral leg which has improved. The right first second toe web space has also improved. Left first second toe web space plantar aspect looks stable. The left buttock has worsened using Santyl . Apparently the buttock has drainage 12/22/17; we have been using Hydrofera Blue to the left lateral leg which continues to improve now 2 small wounds separated by normal skin. He tells us  he had a fever up to 100 yesterday he is prone to UTIs but has not noted anything different. He does in and out catheterizations. The area between the first and second toes today does not look good necrotic surface covered with what looks to be purulent drainage and erythema extending into the third toe. I had gotten this to something that I thought look better last time however it is not look good today. He also has a necrotic surface over the buttock wound which is expanded. I thought there might be infection under here so I removed a lot of the surface with a #5 curet though nothing look like it really needed culturing. He is been using Santyl  to this area 12/27/17; his original wound on the left lateral leg continues to improve using Hydrofera Blue. I gave him samples of Baxdella although he was unable to take them out of fear for an allergic reaction [lump in his throat].the culture I did of the purulent drainage from his second toe last week showed both enterococcus and a set Enterobacter I was also concerned about the erythema on the bottom of his foot although paradoxically although this looks somewhat better  today. 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 We've been using silver alginate here. They came up today that he sleeps in a recliner, I'm not sure why but I asked him to stop this 01/03/18; his original wound we've been using Hydrofera Blue is now separated into 2 areas. Ulcer on his left buttock is better he is off the recliner and sleeping in bed Finally both wound areas between his first and second toes also looks some better Mcshan, [place] E ([id number]) [phone].pdf Page 9 of 40 01/10/18; his original wound on the left lateral leg is now separated into  2 wounds we've been using Hydrofera Blue Ulcer on his left buttock has some drainage. There is a small probing site going into muscle layer superiorly.using silver alginate -He arrives today with a deep tissue injury on the left heel The wound on the dorsal aspect of his first second toe on the left looks a lot betterusing silver alginate ketoconazole The area on the first second toe web space on the right also looks a lot bette 01/17/18; his original wound on the left lateral leg continues to progress using Hydrofera Blue Ulcer on his left buttock also is smaller surface healthier except for a small probing site going into the muscle layer superiorly. 2.4 cm of tunneling in this area DTI on his left heel we have only been offloading. Looks better than last week no threatened open no evidence of infection the wound on the dorsal aspect of the first second toe on the left continues to look like it's regressing we have only been using silver alginate and terbinafine orally The area in the first second toe web space on the right also looks to be a lot better using silver alginate and terbinafine I think this was prompted by tinea pedis 01/31/18; the patient was hospitalized in [place] last week apparently for a complicated UTI. He  was discharged on cefepime  he does in and out catheterizations. In the hospital he was discovered M I don't mild elevation of AST and ALT and the terbinafine was stopped.predictably the pressure ulcer on s his buttock looks betterusing silver alginate. The area on the left lateral leg also is better using Hydrofera Blue. The area between the first and second toes on the left better. First and second toes on the right still substantial but better. Finally the DTI on the left heel has held together and looks like it's resolving 02/07/18-he is here in follow-up evaluation for multiple ulcerations. He has new injury to the lateral aspect of the last issue a pressure ulcer, he states this is from adhesive removal trauma. He states he has tried multiple adhesive products with no success. All other ulcers appear stable. The left heel DTI is resolving. We will continue with same treatment plan and follow-up next week. 02/14/18; follow-up for multiple areas. He has a new area last week on the lateral aspect of his pressure ulcer more over the posterior trochanter. The original pressure ulcer looks quite stable has healthy granulation. 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 Almost fully epithelialized on the original superior area using Hydrofera Blue DTI on the left heel has peeled off this week to reveal a small superficial wound under denuded skin and subcutaneous tissue Both areas between the first and second toes look better including nothing open on the left 02/21/18; The patient's wounds on his left ischial tuberosity and posterior left greater trochanter actually looked better. He has a large area of irritation around the area which I think is contact dermatitis. I am doubtful that this is fungal His original wound on the left lateral calf continues to improve we have been using Hydrofera  Blue There is no open area in the left first second toe web space although there is a lot of thick callus The DTI on the left heel required debridement today of necrotic surface eschar and subcutaneous tissue using silver alginate Finally the area on the right first second toe webspace continues to contract using silver alginate  and ketoconazole 02/28/18 Left ischial tuberosity wounds look better using silver alginate. Original wound on the left calf only has one small open area left using Hydrofera Blue DTI on the left heel required debridement mostly removing skin from around this wound surface. Using silver alginate The areas on the right first/second toe web space using silver alginate and ketoconazole 03/08/18 on evaluation today patient appears to be doing decently well as best I can tell in regard to his wounds. This is the first time that I have seen him as he generally is followed by Dr. Rufus. With that being said none of his wounds appear to be infected he does have an area where there is some skin covering what appears to be a new wound on the left dorsal surface of his great toe. This is right at the nail bed. With that being said I do believe that debrided away some of the excess skin can be of benefit in this regard. Otherwise he has been tolerating the dressing changes without complication. 03/14/18; patient arrives today with the multiplicity of wounds that we are following. He has not been systemically unwell Original wound on the left lateral calf now only has 2 small open areas we've been using Hydrofera Blue which should continue The deep tissue injury on the left heel requires debridement today. We've been using silver alginate The left first second toe and the right first second toe are both are reminiscence what I think was tinea pedis. Apparently some of the callus Surface between the toes was removed last week when it started draining. Purulent drainage coming from the wound  on the ischial tuberosity on the left. 03/21/18-He is here in follow-up evaluation for multiple wounds. 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 He tolerated debridement. The only change to last week's recommendations is to discontinue antifungal cream between toes. He will follow-up next week 03/28/18; following up for multiple wounds;Concern this week is streaking redness and swelling in the right foot. He is going to need antibiotics for this. 03/31/18; follow-up for right foot cellulitis. Streaking redness and swelling in the right foot on 03/28/18. 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 I put him on clindamycin  300 mg every 6 and brought him in for a quick check. He has an open wound between his first and second toes on the right foot as a potential source. 04/04/18; Right foot cellulitis is resolving he is completing clindamycin . This is truly good news Left lateral calf wound which is initial wound only has one small open area inferiorly this is close to healing out. He has compression stockings. We will use Hydrofera Blue right down to the epithelialization of this Nonviable surface on the left heel which was initially pressure with a DTI. We've been using Hydrofera Blue. I'm going to switch this back to silver alginate Left first second toe/tinea pedis this looks better using silver alginate Right first second toe tinea pedis using silver alginate Large pressure ulcers on theLeft ischial tuberosity. Small wound here Looks better. I am uncertain about the surface over the large wound. Using silver alginate 04/11/18; Cellulitis in the right foot is resolved Left lateral calf wound which was his original wounds still has 2 tiny open areas remaining this is just about closed Nonviable surface on the left heel is better but still requires debridement Left first second toe/tinea pedis still open  using silver alginate Right first second toe  wound tinea pedis I asked him to go back to using ketoconazole and silver alginate Large pressure ulcers on the left ischial tuberosity this shear injury here is resolved. Wound is smaller. No evidence of infection using silver alginate 04/18/18; Patient arrives with an intense area of cellulitis in the right mid lower calf extending into the right heel area. Bright red and warm. Smaller area on the left anterior leg. He has a significant history of MRSA. He will definitely need antibioticsdoxycycline He now has 2 open areas on the left ischial tuberosity the original large wound and now a satellite area which I think was above his initial satellite areas. Not a wonderful surface on this satellite area surrounding erythema which looks like pressure related. His left lateral calf wound again his original wound is just about closed Left heel pressure injury still requiring debridement Left first second toe looks a lot better using silver alginate Right first second toe also using silver alginate and ketoconazole cream also looks better 04/20/18; the patient was worked in early today out of concerns with his cellulitis on the right leg. I had started him on doxycycline . This was 2 days ago. His wife was concerned about the swelling in the area. Also concerned about the left buttock. He has not been systemically unwell no fever chills. No nausea vomiting or diarrhea 04/25/18; the patient's left buttock wound is continued to deteriorate he is using Hydrofera Blue. He is still completing clindamycin  for the cellulitis on the right leg although all of this looks better. 05/02/18 Left buttock wound still with a lot of drainage and a very tightly adherent fibrinous necrotic surface. He has a deeper area superiorly Sigman, Antero E ([id number]) [phone].pdf Page 10 of 40 The left lateral calf wound is still closed DTI wound on the left heel  necrotic surface especially the circumference using Iodoflex Areas between his left first second toe and right first second toe both look better. Dorsally and the right first second toe he had a necrotic surface although at smaller. In using silver alginate and ketoconazole. I did a culture last week which was a deep tissue culture of the reminiscence of the open wound on the right first second toe dorsally. This grew a few Acinetobacter and a few methicillin-resistant staph aureus. Nevertheless the area actually this week looked better. I didn't feel the need to specifically address this at least in terms of systemic antibiotics. 05/09/18; wounds are measuring larger more drainage per our intake. We are using Santyl  covered with alginate on the large superficial buttock wounds, Iodosorb on the left heel, ketoconazole and silver alginate to the dorsal first and second toes bilaterally. 05/16/18; The area on his left buttock better in some aspects although the area superiorly over the ischial tuberosity required an extensive debridement.using Santyl  Left heel appears stable. Using Iodoflex The areas between his first and second toes are not bad however there is spreading erythema up the dorsal aspect of his left foot this looks like cellulitis again. He is insensate the erythema is really very brilliant.o Erysipelas He went to see an allergist days ago because he was itching part of this he had lab work done. This showed a white count of 15.1 with 70% neutrophils. Hemoglobin of 11.4 and a platelet count of 659,000. Last white count we had in Epic was a 2-1/2 years ago which was 25.9 but he was ill at the time. He was able to show me some lab work that was done by his  primary physician the pattern is about the same. 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 But prompted me to go ahead and do x-rays of both feet and the pelvis rule out osteomyelitis. He also  had a comprehensive metabolic panel this was reasonably normal his albumin was 3.7 liver function tests BUN/creatinine all normal 05/23/18; x-rays of both his feet from last week were negative for underlying pulmonary abnormality. The x-ray of his pelvis however showed mild irregularity in the left ischial which may represent some early osteomyelitis. The wound in the left ischial continues to get deeper clearly now exposed muscle. Each week necrotic surface material over this area. Whereas the rest of the wounds do not look so bad. The left ischial wound we have been using Santyl  and calcium alginate T the left heel surface necrotic debris using Iodoflex o The left lateral leg is still healed Areas on the left dorsal foot and the right dorsal foot are about the same. There is some inflammation on the left which might represent contact dermatitis, fungal dermatitis I am doubtful cellulitis although this looks better than last week 05/30/18; CT scan done at Hospital did not show any osteomyelitis or abscess. Suggested the possibility of underlying cellulitis although I don't see a lot of evidence of this at the bedside The wound itself on the left buttock/upper thigh actually looks somewhat better. No debridement Left heel also looks better no debridement continue Iodoflex Both dorsal first second toe spaces appear better using Lotrisone. Left still required debridement 06/06/18; Intake reported some purulent looking drainage from the left gluteal wound. Using Santyl  and calcium alginate Left heel looks better although still a nonviable surface requiring debridement The left dorsal foot first/second webspace actually expanding and somewhat deeper. I may consider doing a shave biopsy of this area Right dorsal foot first/second webspace appears stable to improved. Using Lotrisone and silver alginate to both these areas 06/13/18 Left gluteal surface looks better. Now separated in the 2 wounds. No  debridement required. Still drainage. We'll continue silver alginate Left heel continues to look better with Iodoflex continue this for at least another week Of his dorsal foot wounds the area on the left still has some depth although it looks better than last week. We've been using Lotrisone and silver alginate 06/20/18 Left gluteal continues to look better healthy tissue Left heel continues to look better healthy granulation wound is smaller. He is using Iodoflex and his long as this continues continue the Iodoflex Dorsal right foot looks better unfortunately dorsal left foot does not. There is swelling and erythema of his forefoot. He had minor trauma to this several days ago but doesn't think this was enough to have caused any tissue injury. Foot looks like cellulitis, we have had this problem before 06/27/18 on evaluation today patient appears to be doing a little worse in regard to his foot ulcer. 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 Both of which would really be his only options for treating this infection. In the past he has been given and effusion of Orbactiv. This is done very well for him in the past again it's one time dosing IV antibiotic therapy. Subsequently I do believe this is something we're gonna need to see about doing at this point in time. Currently his other wounds seem to  be doing somewhat better in my pinion I'm pretty happy in that regard. 07/03/18 on evaluation today patient's wounds actually appear to be doing fairly well. He has been tolerating the dressing changes without complication. All in all he seems to be showing signs of improvement. In regard to the antibiotics he has been dealing with infectious disease since I saw him last week as far as getting this scheduled. In the end [facility] this coming Friday. In the meantime he has been continuing to perform the dressing changes in such as previous. There does not appear to be any evidence of infection worsengin at this time. 07/10/18; Since I last saw this man 2 weeks ago things have actually improved. IV antibiotics of resulted in less forefoot erythema although there is still some present. He is not systemically unwell Left buttock wounds 2 now have no depth there is increased epithelialization Using silver alginate Left heel still requires debridement using Iodoflex Left dorsal foot still with a sizable wound about the size of a border but healthy granulation Right dorsal foot still with a slitlike area using silver alginate 07/18/18; the patient's cellulitis in the left foot is improved in fact I think it is on its way to resolving. Left buttock wounds 2 both look better although the larger one has hypertension granulation we've been using silver alginate Left heel has some thick circumferential redundant skin over the wound edge which will need to be removed today we've been using Iodoflex Left dorsal foot is still a sizable wound required debridement using silver alginate The right dorsal foot is just about closed only a small open area remains here 07/25/18; left foot cellulitis is resolved Left buttock wounds 2 both look better. Hyper-granulation on the major area Left heel as some debris over the surface but otherwise looks a healthier wound. Using silver collagen Right dorsal foot is just about closed 07/31/18; arrives with our intake nurse worried about purulent drainage from the buttock. We had hyper-granulation here last week His buttock wounds 2 continue to look better Left heel some debris over the surface but measuring smaller. Right dorsal foot unfortunately has openings between the toes Left foot superficial wound looks less aggravated. 08/07/18 Buttock wounds continue to look better  although some of her granulation and the larger medial wound. silver alginate Left heel continues to look a lot better.silver collagen Left foot superficial wound looks less stable. Requires debridement. He has a new wound superficial area on the foot on the lateral dorsal foot. Right foot looks better using silver alginate without Lotrisone 08/14/2018; patient was in the ER last week diagnosed with a UTI. He is now on Cefpodoxime and Macrodantin . Buttock wounds continued to be smaller. Using silver alginate Left heel continues to look better using silver collagen Left foot superficial wound looks as though it is improving Right dorsal foot area is just about healed. 08/21/2018; patient is completed his antibiotics for his UTI. He has 2 open areas on the buttocks. There is still not closed although the surface looks satisfactory. Using silver alginate Burditt, Jeray E ([id number]) [id number].pdf Page 11 of 40 Left heel continues to improve using silver collagen The bilateral dorsal foot areas which are at the base of his first and second toes/possible tinea pedis are actually stable on the left but worse on the right. The area on the left required debridement of necrotic surface. After debridement I obtained a specimen  for PCR culture. The right dorsal foot which is been just about healed last week is now reopened 08/28/2018; culture done on the left dorsal foot showed coag negative staph both staph epidermidis and Lugdunensis. I think this is worthwhile initiating systemic treatment. I will use doxycycline  given his long list of allergies. The area on the left heel slightly improved but still requiring debridement. The large wound on the buttock is just about closed whereas the smaller one is larger. Using silver alginate in this area 09/04/2018; patient is completing his doxycycline  for the left foot although this continues to be a very difficult wound area with very adherent  necrotic debris. We are using silver alginate to all his wounds right foot left foot and the small wounds on his buttock, silver collagen on the left heel. 09/11/2018; once again this patient has intense erythema and swelling of the left forefoot. Lesser degrees of erythema in the right foot. He has a long list of allergies and intolerances. I will reinstitute doxycycline . 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 Using silver alginate Left heel also looks better using silver collagen Unfortunately both the areas on his feet look worse. The area on the left first second webspace is now gone through to the plantar part of his foot. The area on the left foot anteriorly is irritated with erythema and swelling in the forefoot. 09/25/2018 His wound on the left plantar heel looks better. Using silver collagen The area on the left buttock 2 small remnant areas. One is closed one is still open. Using silver alginate The areas between both his first and second toes look worse. This in spite of long-standing antifungal therapy with ketoconazole and silver alginate which should have antifungal activity He has small areas around his original wound on the left calf one is on the bottom of the original scar tissue and one superiorly both of these are small and superficial but again given wound history in this site this is worrisome 10/02/2018 Left plantar heel continues to gradually contract using silver collagen Left buttock wound is unchanged using silver alginate The areas on his dorsal feet between his first and second toes bilaterally look about the same. I prescribed clindamycin  ointment to see if we can address chronic staph colonization and also the underlying possibility of erythrasma The left lateral lower extremity wound is actually on the lateral part of his ankle. Small open area here. We have been using silver alginate 10/09/2018; Left plantar heel  continues to look healthy and contract. No debridement is required Left buttock slightly smaller with a tape injury wound just below which was new this week Dorsal feet somewhat improved I have been using clindamycin  Left lateral looks lower extremity the actual open area looks worse although a lot of this is epithelialized. I am going to change to silver collagen today He has a lot more swelling in the right leg although this is not pitting not red and not particularly warm there is a lot of spasm in the right leg usually indicative of people with paralysis of some underlying discomfort. We have reviewed his vascular status from 2017 he had a left greater saphenous vein ablation. I wonder about referring him back to vascular surgery if the area on the left leg continues to deteriorate. 10/16/2018 in today for follow-up and management of multiple lower extremity ulcers. His left Buttock wound is much lower smaller and almost closed completely. The  wound to the left ankle has began to reopen with Epithelialization and some adherent slough. He has multiple new areas to the left foot and leg. The left dorsal foot without much improvement. Wound present between left great webspace and 2nd toe. Erythema and edema present right leg. Right LE ultrasound obtained on 10/10/18 was negative for DVT . 10/23/2018; Left buttock is closed over. Still dry macerated skin but there is no open wound. I suspect this is chronic pressure/moisture Left lateral calf is quite a bit worse than when I saw this last. There is clearly drainage here he has macerated skin into the left plantar heel. We will change the primary dressing to alginate Left dorsal foot has some improvement in overall wound area. Still using clindamycin  and silver alginate Right dorsal foot about the same as the left using clindamycin  and silver alginate The erythema in the right leg has resolved. He is DVT rule out was negative Left heel pressure area  required debridement although the wound is smaller and the surface is health 10/26/2018 The patient came back in for his nurse check today predominantly because of the drainage coming out of the left lateral leg with a recent reopening of his original wound on the left lateral calf. He comes in today with a large amount of surrounding erythema around the wound extending from the calf into the ankle and even in the area on the dorsal foot. He is not systemically unwell. He is not febrile. Nevertheless this looks like cellulitis. We have been using silver alginate to the area. I changed him to a regular visit and I am going to prescribe him doxycycline . The rationale here is a long list of medication intolerances and a history of MRSA. I did not see anything that I thought would provide a valuable culture 10/30/2018 Follow-up from his appointment 4 days ago with really an extensive area of cellulitis in the left calf left lateral ankle and left dorsal foot. I put him on doxycycline . He has a long list of medication allergies which are true allergy reactions. 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 In any case he is a lot better today. The erythema especially in the anterior and lateral left calf is better. He still has left ankle erythema. He also is complaining about increasing edema in the right leg we have only been using Kerlix Coban and he has been doing the wraps at home. Finally he has a spotty rash on the medial part of his upper left calf which looks like folliculitis or perhaps wrap occlusion type injury. Small superficial macules not pustules 11/06/18 patient arrives today with again a considerable degree of erythema around the wound on the left lateral calf extending into the dorsal ankle and dorsal foot. This is a lot worse than when I saw this last week. He is on doxycycline  really with not a lot of improvement. He has  not been systemically unwell Wounds on the; left heel actually looks improved. Original area on the left foot and proximity to the first and second toes looks about the same. He has superficial areas on the dorsal foot, anterior calf and then the reopening of his original wound on the left lateral calf which looks about the same The only area he has on the right is the dorsal webspace first and second which is smaller. He has a large area of dry erythematous skin on the left buttock small  open area here. 11/13/2018; the patient arrives in much better condition. The erythema around the wound on the left lateral calf is a lot better. Not sure whether this was the clindamycin  or the TCA and ketoconazole or just in the improvement in edema control [stasis dermatitis]. In any case this is a lot better. The area on the left heel is very small and just about resolved using silver collagen we have been using silver alginate to the areas on his dorsal feet 11/20/2018; his wounds include the left lateral calf, left heel, dorsal aspects of both feet just proximal to the first second webspace. He is stable to slightly improved. I did not think any changes to his dressings were going to be necessary 11/27/2018 he has a reopening on the left buttock which is surrounded by what looks like tinea or perhaps some other form of dermatitis. The area on the left dorsal foot has some erythema around it I have marked this area but I am not sure whether this is cellulitis or not. Left heel is not closed. Left calf the reopening is really slightly longer and probably worse 1/13; in general things look better and smaller except for the left dorsal foot. Area on the left heel is just about closed, left buttock looks better only a small wound remains in the skin looks better [using Lotrisone] 1/20; the area on the left heel only has a few remaining open areas here. Left lateral calf about the same in terms of size, left dorsal  foot slightly larger right lateral foot still not closed. The area on the left buttock has no open wound and the surrounding skin looks a lot better 1/27; the area on the left heel is closed. Left lateral calf better but still requiring extensive debridements. The area on his left buttock is closed. He still has the open areas on the left dorsal foot which is slightly smaller in the right foot which is slightly expanded. We have been using Iodoflex on these areas as well 2/3; left heel is closed. Left lateral calf still requiring debridement using Iodoflex there is no open area on his left buttock however he has dry scaly skin over a large area of this. Not really responding well to the Lotrisone. Finally the areas on his dorsal feet at the level of the first second webspace are slightly smaller on the right and about the same on the left. Both of these vigorously debrided with Anasept and gauze Burdine, Felis E ([id number]) [phone].pdf Page 12 of 40 2/10; left heel remains closed he has dry erythematous skin over the left buttock but there is no open wound here. Left lateral leg has come in and with. Still requiring debridement we have been using Iodoflex here. Finally the area on the left dorsal foot and right dorsal foot are really about the same extremely dry callused fissured areas. He does not yet have a dermatology appointment 2/17; left heel remains closed. He has a new open area on the left buttock. The area on the left lateral calf is bigger longer and still covered in necrotic debris. No major change in his foot areas bilaterally. I am awaiting for a dermatologist to look on this. We have been using ketoconazole I do not know that this is been doing any good at all. 2/24; left heel remains closed. The left buttock wound that was new reopening last week looks better. The left lateral calf appears better also although still requires debridement. The major area  on his  foot is the left first second also requiring debridement. We have been putting Prisma on all wounds. I do not believe that the ketoconazole has done too much good for his feet. He will use Lotrisone I am going to give him a 2-week course of terbinafine. We still do not have a dermatology appointment 3/2 left heel remains closed however there is skin over bone in this area I pointed this out to him today. The left buttock wound is epithelialized but still does not look completely stable. The area on the left leg required debridement were using silver collagen here. With regards to his feet we changed to Lotrisone last week and silver alginate. 3/9; left heel remains closed. Left buttock remains closed. The area on the right foot is essentially closed. The left foot remains unchanged. Slightly smaller on the left lateral calf. Using silver collagen to both of these areas 3/16-Left heel remains closed. Area on right foot is closed. Left lateral calf above the lateral malleolus open wound requiring debridement with easy bleeding. Left dorsal wound proximal to first toe also debrided. Left ischial area open new. Patient has been using Prisma with wrapping every 3 days. Dermatology appointment is apparently tomorrow.Patient has completed his terbinafine 2-week course with some apparent improvement according to him, there is still flaking and dry skin in his foot on the left 3/23; area on the right foot is reopened. The area on the left anterior foot is about the same still a very necrotic adherent surface. He still has the area on the left leg and reopening is on the left buttock. He apparently saw dermatology although I do not have a note. According to the patient who is usually fairly well informed they did not have any good ideas. Put him on oral terbinafine which she is been on before. 3/30; using silver collagen to all wounds. Apparently his dermatologist put him on doxycycline  and rifampin presumably  some culture grew staph. I do not have this result. He remains on terbinafine although I have used terbinafine on him before 4/6; patient has had a fairly substantial reopening on the right foot between the first and second toes. He is finished his terbinafine and I believe is on doxycycline  and rifampin still as prescribed by dermatology. We have been using silver collagen to all his wounds although the patient reports that he thinks silver alginate does better on the wounds on his buttock. 4/13; the area on his left lateral calf about the same size but it did not require debridement. Left dorsal foot just proximal to the webspace between the first and second toes is about the same. Still nonviable surface. I note some superficial bronze discoloration of the dorsal part of his foot Right dorsal foot just proximal to the first and second toes also looks about the same. I still think there may be the same discoloration I noted above on the left Left buttock wound looks about the same 4/20; left lateral calf appears to be gradually contracting using silver collagen. 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 The left dorsal foot wound is debrided of tightly adherent necrotic debris and really cleans up quite nicely. The right area is worse with expansion. I did not debride this it is now over the base of the second toe The area on his left buttock is smaller no debridement is required using silver collagen 5/4; left calf continues to make good progress. He arrives with  erythema around the wounds on his dorsal foot which even extends to the plantar aspect. Very concerning for coexistent infection. He is finished the erythromycin I gave him for possible erythrasma this does not seem to have helped. The area on the left foot is about the same base of the dorsal toes Is area on the buttock looks improved on the left 5/11; left calf and left  buttock continued to make good progress. Left foot is about the same to slightly improved. Major problem is on the right foot. He has not had an x-ray. Deep tissue culture I did last week showed both Enterobacter and E. coli. I did not change the doxycycline  I put him on empirically although neither 1 of these were plated to doxycycline . He arrives today with the erythema looking worse on both the dorsal and plantar foot. Macerated skin on the bottom of the foot. he has not been systemically unwell 5/18-Patient returns at 1 week, left calf wound appears to be making some progress, left buttock wound appears slightly worse than last time, left foot wound looks slightly better, right foot redness is marginally better. X-ray of both feet show no air or evidence of osteomyelitis. Patient is finished his Omnicef  and terbinafine. He continues to have macerated skin on the bottom of the left foot as well as right 5/26; left calf wound is better, left buttock wound appears to have multiple small superficial open areas with surrounding macerated skin. X-rays that I did last time showed no evidence of osteomyelitis in either foot. He is finished cefdinir  and doxycycline . I do not think that he was on terbinafine. He continues to have a large superficial open area on the right foot anterior dorsal and slightly between the first and second toes. I did send him to dermatology 2 months ago or so wondering about whether they would do a fungal scraping. I do not believe they did but did do a culture. We have been using silver alginate to the toe areas, he has been using antifungals at home topically either ketoconazole or Lotrisone. We are using silver collagen on the left foot, silver alginate on the right, silver collagen on the left lateral leg and silver alginate on the left buttock 6/1; left buttock area is healed. We have the left dorsal foot, left lateral leg and right dorsal foot. We are using silver alginate  to the areas on both feet and silver collagen to the area on his left lateral calf 6/8; the left buttock apparently reopened late last week. He is not really sure how this happened. He is tolerating the terbinafine. Using silver alginate to all wounds 6/15; left buttock wound is larger than last week but still superficial. Came in the clinic today with a report of purulence from the left lateral leg I did not identify any infection Both areas on his dorsal feet appear to be better. He is tolerating the terbinafine. Using silver alginate to all wounds 6/22; left buttock is about the same this week, left calf quite a bit better. His left foot is about the same however he comes in with erythema and warmth in the right forefoot once again. Culture that I gave him in the beginning of May showed Enterobacter and E. coli. I gave him doxycycline  and things seem to improve although neither 1 of these organisms was specifically plated. 6/29; left buttock is larger and dry this week. Left lateral calf looks to me to be improved. Left dorsal foot also somewhat improved  right foot completely unchanged. The erythema on the right foot is still present. He is completing the Ceftin dinner that I gave him empirically [see discussion above.) 7/6 - All wounds look to be stable and perhaps improved, the left buttock wound is slightly smaller, per patient bleeds easily, completed ceftin, the right foot redness is less, he is on terbinafine 7/13; left buttock wound about the same perhaps slightly narrower. Area on the left lateral leg continues to narrow. Left dorsal foot slightly smaller right foot about the same. We are using silver alginate on the right foot and Hydrofera Blue to the areas on the left. Unna boot on the left 2 layer compression on the right 7/20; left buttock wound absolutely the same. Area on lateral leg continues to get better. Left dorsal foot require debridement as did the right no major change in  the 7/27; left buttock wound the same size necrotic debris over the surface. The area on the lateral leg is closed once again. His left foot looks better right foot about the same although there is some involvement now of the posterior first second toe area. He is still on terbinafine which I have given him for a month, not certain a centimeter major change 06/25/19-All wounds appear to be slightly improved according to report, left buttock wound looks clean, both foot wounds have minimal to no debris the right dorsal foot has minimal slough. We are using Hydrofera Blue to the left and silver alginate to the right foot and ischial wound. 8/10-Wounds all appear to be around the same, the right forefoot distal part has some redness which was not there before, however the wound looks clean and small. Ischial wound looks about the same with no changes 8/17; his wound on the left lateral calf which was his original chronic venous insufficiency wound remains closed. Since I last saw him the areas on the left dorsal foot right dorsal foot generally appear better but require debridement. The area on his left initial tuberosity appears somewhat larger to me perhaps hyper granulated and bleeds very easily. We have been using Hydrofera Blue to the left dorsal foot and silver alginate to everything else 8/24; left lateral calf remains closed. The areas on his dorsal feet on the webspace of the first and second toes bilaterally both look better. The area on the left buttock which is the pressure ulcer stage II slightly smaller. I change the dressing to Hydrofera Blue to all areas 8/31; left lateral calf remains closed. The area on his dorsal feet bilaterally look better. Using Hydrofera Blue. Still requiring debridement on the left foot. No Morten, Estelle E ([id number]) [id number].pdf Page 13 of 40 change in the left buttock pressure ulcers however 9/14; left lateral calf remains closed. Dorsal  feet look quite a bit better than 2 weeks ago. Flaking dry skin also a lot better with the ammonium lactate I gave him 2 weeks ago. The area on the left buttock is improved. He states that his Roho cushion developed a leak and he is getting a new one, in the interim he is offloading this vigorously 9/21; left calf remains closed. Left heel which was a possible DTI looks better this week. He had macerated tissue around the left dorsal foot right foot looks satisfactory and improved left buttock wound. I changed his dressings to his feet to silver alginate bilaterally. Continuing Hydrofera Blue on the left buttock. 9/28 left calf remains closed. Left heel did not develop anything [possible DTI] dry flaking  skin on the left dorsal foot. Right foot looks satisfactory. Improved left buttock wound. We are using silver alginate on his feet Hydrofera Blue on the buttock. I have asked him to go back to the Lotrisone on his feet including the wounds and surrounding areas 10/5; left calf remains closed. The areas on the left and right feet about the same. A lot of this is epithelialized however debris over the remaining open areas. He is using Lotrisone and silver alginate. The area on the left buttock using Hydrofera Blue 10/26. Patient has been out for 3 weeks secondary to Covid concerns. He tested negative but I think his wife tested positive. He comes in today with the left foot substantially worse, right foot about the same. Even more concerning he states that the area on his left buttock closed over but then reopened and is considerably deeper in one aspect than it was before [stage III wound] 11/2; left foot really about the same as last week. Quarter sized wound on the dorsal foot just proximal to the first second toes. Surrounding erythema with areas of denuded epithelium. This is not really much different looking. Did not look like cellulitis this time however. Right foot area about the same.. We have  been using silver alginate alginate on his toes Left buttock still substantial irritated skin around the wound which I think looks somewhat better. We have been using Hydrofera Blue here. 11/9; left foot larger than last week and a very necrotic surface. Right foot I think is about the same perhaps slightly smaller. Debris around the circumference also addressed. Unfortunately on the left buttock there is been a decline. Satellite lesions below the major wound distally and now a an additional one posteriorly we have been using Hydrofera Blue but I think this is a pressure issue 11/16; left foot ulcer dorsally again a very adherent necrotic surface. Right foot is about the same. Not much change in the pressure ulcer on his left buttock. 11/30; left foot ulcer dorsally basically the same as when I saw him 2 weeks ago. Very adherent fibrinous debris on the wound surface. Patient reports a lot of drainage as well. 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 We have been using Iodoflex, patient changed back to alginate because of the drainage. Area on his right dorsal foot really looks benign with a healthier surface certainly a lot better than on the left. Left buttock wounds all improved using Hydrofera Blue 12/7; left dorsal foot again no improvement. Tightly adherent debris. PCR culture I did last week only showed likely skin contaminant. I have gone ahead and done a punch biopsy of this which is about the last thing in terms of investigations I can think to do. He has known venous insufficiency and venous hypertension and this could be the issue here. The area on the right foot is about the same left buttock slightly worse according to our intake nurse secondary to [facility] Blue sticking to the wound 12/14; biopsy of the left foot that I did last time showed changes that could be related to wound healing/chronic stasis dermatitis phenomenon no  neoplasm. We have been using silver alginate to both feet. I change the one on the left today to Sorbact and silver alginate to his other 2 wounds 12/28; the patient arrives with the following problems; Major issue is the dorsal left foot which continues to be a larger deeper wound area. Still with a completely nonviable surface  Paradoxically the area mirror image on the right on the right dorsal foot appears to be getting better. He had some loss of dry denuded skin from the lower part of his original wound on the left lateral calf. Some of this area looked a little vulnerable and for this reason we put him in wrap that on this side this week The area on his left buttock is larger. He still has the erythematous circular area which I think is a combination of pressure, sweat. This does not look like cellulitis or fungal dermatitis 11/26/2019; -Dorsal left foot large open wound with depth. Still debris over the surface. Using Sorbact The area on the dorsal right foot paradoxically has closed over He has a reopening on the left ankle laterally at the base of his original wound that extended up into the calf. This appears clean. The left buttock wound is smaller but with very adherent necrotic debris over the surface. We have been using silver alginate here as well The patient had arterial studies done in 2017. He had biphasic waveforms at the dorsalis pedis and posterior tibial bilaterally. ABI in the left was 1.17. Digit waveforms were dampened. He has slight spasticity in the great toes I do not think a TBI would be possible 1/11; the patient comes in today with a sizable reopening between the first and second toes on the right. This is not exactly in the same location where we have been treating wounds previously. According to our intake nurse this was actually fairly deep but 0.6 cm. The area on the left dorsal foot looks about the same the surface is somewhat cleaner using Sorbact, his MRI is in 2  days. We have not managed yet to get arterial studies. The new reopening on the left lateral calf looks somewhat better using alginate. The left buttock wound is about the same using alginate 1/18; the patient had his ARTERIAL studies which were quite normal. ABI in the right at 1.13 with triphasic/biphasic waveforms on the left ABI 1.06 again with triphasic/biphasic waveforms. It would not have been possible to have done a toe brachial index because of spasticity. We have been using Sorbac to the left foot alginate to the rest of his wounds on the right foot left lateral calf and left buttock 1/25; arrives in clinic with erythema and swelling of the left forefoot worse over the first MTP area. This extends laterally dorsally and but also posteriorly. Still has an area on the left lateral part of the lower part of his calf wound it is eschared and clearly not closed. Area on the left buttock still with surrounding irritation and erythema. Right foot surface wound dorsally. The area between the right and first and second toes appears better. 2/1; The left foot wound is about the same. Erythema slightly better I gave him a week of doxycycline  empirically Right foot wound is more extensive extending between the toes to the plantar surface Left lateral calf really no open surface on the inferior part of his original wound however the entire area still looks vulnerable Absolutely no improvement in the left buttock wound required debridement. 2/8; the left foot is about the same. Erythema is slightly improved I gave him clindamycin  last week. Right foot looks better he is using Lotrimin and silver alginate He has a breakdown in the left lateral calf. Denuded epithelium which I have removed Left buttock about the same were using Hydrofera Blue 2/15; left foot is about the same there is less  surrounding erythema. Surface still has tightly adherent debris which I have debriding however not making  any progress Right foot has a substantial wound on the medial right second toe between the first and second webspace. Still an open area on the left lateral calf distal area. Buttock wound is about the same 2/22; left foot is about the same less surrounding erythema. Surface has adherent debris. Polymen Ag Right foot area significant wound between the first and second toes. We have been using silver alginate here Left lateral leg polymen Ag at the base of his original venous insufficiency wound Left buttock some improvement here 3/1; Right foot is deteriorating in the first second toe webspace. Larger and more substantial. We have been using silver alginate. Left dorsal foot about the same markedly adherent surface debris using PolyMem Ag Left lateral calf surface debris using PolyMem AG Left buttock is improved again using PolyMem Ag. He is completing his terbinafine. The erythema in the foot seems better. He has been on this for 2 weeks 3/8; no improvement in any wound area in fact he has a small open area on the dorsal midfoot which is new this week. He has not gotten his foot x-rays yet 3/15; his x-rays were both negative for osteomyelitis of both feet. No major change in any of his wounds on the extremities however his buttock wounds are better. We have been using polymen on the buttocks, left lower leg. Iodoflex on the left foot and silver alginate on the right 3/22; arrives in clinic today with the 2 major issues are the improvement in the left dorsal foot wound which for once actually looks healthy with a nice healthy Crisco, Enrique E ([id number]) [phone].pdf Page 14 of 40 wound surface without debridement. Using Iodoflex here. Unfortunately on the left lateral calf which is in the distal part of his original wound he came to the clinic here for there was purulent drainage noted some increased breakdown scattered around the original area and a small area  proximally. We we are using polymen here will change to silver alginate today. His buttock wound on the left is better and I think the area on the right first second toe webspace is also improved 3/29; left dorsal foot looks better. Using Iodoflex. Left ankle culture from deterioration last time grew E. coli, Enterobacter and Enterococcus. I will give him a course of cefdinir  although that will not cover Enterococcus. The area on the right foot in the webspace of the first and second toe lateral first toe looks better. The area on his buttock is about healed Vascular appointment is on April 21. This is to look at his venous system vis--vis continued breakdown of the wounds on the left including the left lateral leg and left dorsal foot he. He has had previous ablations on this side 4/5; the area between the right first and second toes lateral aspect of the first toe looks better. Dorsal aspect of the left first toe on the left foot also improved. Unfortunately the left lateral lower leg is larger and there is a second satellite wound superiorly. The usual superficial abrasions on the left buttock overall better but certainly not closed 4/12; the area between the right first and second toes is improved. Dorsal aspect of the left foot also slightly smaller with a vibrant healthy looking surface. No real change in the left lateral leg and the left buttock wound is healed He has an unaffordable co-pay for Apligraf. Appointment with vein and vascular with  regards to the left leg venous part of the circulation is on 4/21 4/19; we continue to see improvement in all wound areas. Although this is minor. He has his vascular appointment on 4/21. The area on the left buttock has not reopened although right in the center of this area the skin looks somewhat threatened 4/26; the left buttock is unfortunately reopened. In general his left dorsal foot has a healthy surface and looks somewhat smaller although it was  not measured as such. The area between his first and second toe webspace on the right as a small wound against the first toe. The patient saw vascular surgery. The real question I was asking was about the small saphenous vein on the left. He has previously ablated left greater saphenous vein. Nothing further was commented on on the left. Right greater saphenous vein without reflux at the saphenofemoral junction or proximal thigh there was no indication for ablation of the right greater saphenous vein duplex was negative for DVT bilaterally. They did not think there was anything from a vascular surgery point of view that could be offered. They ABIs within normal limits 5/3; only small open area on the left buttock. The area on the left lateral leg which was his original venous reflux is now 2 wounds both which look clean. We are using Iodoflex on the left dorsal foot which looks healthy and smaller. He is down to a very tiny area between the right first and second toes, using silver alginate 5/10; all of his wounds appear better. We have much better edema control in 4 layer compression on the left. This may be the factor that is allowing the left foot and left lateral calf to heal. He has external compression garments at home 04/14/20-All of his wounds are progressing well, the left forefoot is practically closed, left ischium appears to be about the same, right toe webspace is also smaller. The left lateral leg is about the same, continue using Hydrofera Blue to this, silver alginate to the ischium, Iodoflex to the toe space on the right 6/7; most of his wounds outside of the left buttock are doing well. The area on the left lateral calf and left dorsal foot are smaller. The area on the right foot in between the first and second toe webspace is barely visible although he still says there is some drainage here is the only reason I did not heal this out. Unfortunately the area on the left buttock almost  looks like he has a skin tear from tape. He has open wound and then a large flap of skin that we are trying to get adherence over an area just next to the remaining wound 6/21; 2 week follow-up. I believe is been here for nurse visits. Miraculously the area between his first and second toes on the left dorsal foot is closed over. Still open on the right first second web space. The left lateral calf has 2 open areas. Distally this is more superficial. The proximal area had a little more depth and required debridement of adherent necrotic material. His buttock wound is actually larger we have been using silver alginate here 6/28; the patient's area on the left foot remains closed. Still open wet area between the first and second toes on the right and also extending into the plantar aspect. We have been using silver alginate in this location. He has 2 areas on the left lower leg part of his original long wounds which I think are better.  We have been using Hydrofera Blue here. Hydrofera Blue to the left buttock which is stable 7/12; left foot remains closed. Left ankle is closed. May be a small area between his right first and second toes the only truly open area is on the left buttock. We have been using Hydrofera Blue here 7/19; patient arrives with marked deterioration especially in the left foot and ankle. We did not put him in a compression wrap on the left last week in fact he wore his juxta lite stockings on either side although he does not have an underlying stocking. He has a reopening on the left dorsal foot, left lateral ankle and a new area on the right dorsal ankle. More worrisome is the degree of erythema on the left foot extending on the lateral foot into the lateral lower leg on the left 7/26; the patient had erythema and drainage from the lateral left ankle last week. Culture of this grew MRSA resistant to doxycycline  and clindamycin  which are the 2 antibiotics we usually use with this  patient who has multiple antibiotic allergies including linezolid, trimethoprim sulfamethoxazole. I had give him an empiric doxycycline  and he comes in the area certainly looks somewhat better although it is blotchy in his lower leg. He has not been systemically unwell. He has had areas on the left dorsal foot which is a reopening, chronic wounds on the left lateral ankle. Both of these I think are secondary to chronic venous insufficiency. The area between his first and second toes is closed as far as I can tell. He had a new wrap injury on the right dorsal ankle last week. Finally he has an area on the left buttock. We have been using silver alginate to everything except the left buttock we are using Hydrofera Blue 06/30/20-Patient returns at 1 week, has been given a sample dose pack of NUZYRA  which is a tetracycline derivative [omadacycline ], patient has completed those, we have been using silver alginate to almost all the wounds except the left ischium where we are using Hydrofera Blue all of them look better 8/16; since I last saw the patient he has been doing well. The area on the left buttock, left lateral ankle and left foot are all closed today. He has completed the Nuzyra  I gave him last time and tolerated this well. He still has open areas on the right dorsal ankle and in the right first second toe area which we are using silver alginate. 8/23; we put him in his bilateral external compression stockings last week as he did not have anything open on either leg except for concerning area between the right first and second toe. He comes in today with an area on the left dorsal foot slightly more proximal than the original wound, the left lateral foot but this is actually a continuation of the area he had on the left lateral ankle from last time. As well he is opened up on the left buttock again. 8/30; comes in today with things looking a lot better. The area on the left lower ankle has closed down  as has the left foot but with eschar in both areas. The area on the dorsal right ankle is also epithelialized. Very little remaining of the left buttock wound. We have been using silver alginate on all wound areas 9/13; the area in the first second toe webspace on the right has fully epithelialized. He still has some vulnerable epithelium on the right and the ankle and the dorsal foot.  He notes weeping. He is using his juxta lite stocking. On the left again the left dorsal foot is closed left lateral ankle is closed. We went to the juxta lite stocking here as well. Still vulnerable in the left buttock although only 2 small open areas remain here 9/27; 2-week follow-up. We did not look at his left leg but the patient says everything is closed. He is a bit disturbed by the amount of edema in his left foot he is using juxta lite stockings but asking about over the toes stockings which would be 30/40, will talk to him next time. According to him there is no open wound on either the left foot or the left ankle/calf He has an open area on the dorsal right calf which I initially point a wrap injury. He has superficial remaining wound on the left ischial tuberosity been using silver alginate although he says this sticks to the wound 10/5; we gave him 2-week follow-up but he called yesterday expressing some concerns about his right foot right ankle and the left buttock. He came in early. There is still no open areas on the left leg and that still in his juxta lite stocking 10/11; he only has 1 small area on the left buttock that remains measuring millimeters 1 mm. Still has the same irritated skin in this area. We recommended zinc oxide when this eventually closes and pressure relief is meticulously is he can do this. He still has an area on the dorsal part of his right first through third toes which is a bit irritated and still open and on the dorsal ankle near the crease of the ankle. We have been using  silver alginate and using his own stocking. He has nothing open on the left leg or foot 10/25; 2-week follow-up. Not nearly as good on the left buttock as I was hoping. For open areas with 5 looking threatened small. He has the erythematous irritated chronic skin in this area. 1 area on the right dorsal ankle. He reports this area bleeds easily Right dorsal foot just proximal to the base of his toes We have been using silver alginate. 11/8; 2-week follow-up. Left buttock is about the same although I do not think the wounds are in the same location we have been using silver alginate. I have asked him to use zinc oxide on the skin around the wounds. He still has a small area on the right dorsal ankle he reports this bleeds easily Right dorsal foot just proximal to the base of the toes does not have anything open although the skin is very dry and scaly He has a new opening on the nailbed of the left great toe. Nothing on the left ankle Anger, Celso E ([id number]) [phone].pdf Page 15 of 40 11/29; 3-week follow-up. Left buttock has 2 open areas. And washing of these wounds today started bleeding easily. Suggesting very friable tissue. We have been using silver alginate. Right dorsal ankle which I thought was initially a wrap injury we have been using silver alginate. Nothing open between the toes that I can see. He states the area on the left dorsal toe nailbed healed after the last visit in 2 or 3 days 12/13; 3-week follow-up. His left buttock now has 3 open areas but the original 2 areas are smaller using polymen here. Surrounding skin looks better. The right dorsal ankle is closed. He has a small opening on the right dorsal foot at the level of the third toe. In  general the skin looks better here. He is wearing his juxta lite stocking on the left leg says there is nothing open 11/24/2020; 3 weeks follow-up. His left buttock still has the 3 open areas. We have been using  polymen but due to lack of response he changed to Hydrofera Blue area. Surrounding skin is dry erythematous and irritated looking. There is no evidence of infection either bacterial or fungal however there is loss of surface epithelium He still has very dry skin in his foot causing irritation and erythema on the dorsal part of his toes. This is not responded to prolonged courses of antifungal simply looks dry and irritated 1/24; left buttock area still looks about the same he was unable to find the triad ointment that we had suggested. The area on the right lower leg just above the dorsal ankle has reopened and the areas on the right foot between the first second and second third toes and scaling on the bottom of the foot has been about the same for quite some time now. been using silver alginate to all wound areas 2/7; left buttock wound looked quite good although not much smaller in terms of surface area surrounding skin looks better. Only a few dry flaking areas on the right foot in between the first and second toes the skin generally looks better here [ammonium lactate]. Finally the area on the right dorsal ankle is closed 2/21; There is no open area on the right foot even between the right first and second toe. Skin around this area dorsally and plantar aspects look better. He has a reopening of the area on the right ankle just above the crease of the ankle dorsally. I continue to think that this is probably friction from spasms may be even this time with his stocking under the compression stockings. Wounds on his left buttock look about the same there a couple of areas that have reopened. He has a total square area of loss of epithelialization. This does not look like infection it looks like a contact dermatitis but I just cannot determine to what 3/14; there is nothing on the right foot between the first and second toes this was carefully inspected under illumination. Some chronic irritation  on the dorsal part of his foot from toes 1-3 at the base. Nothing really open here substantially. Still has an area on the right foot/ankle that is actually larger and hyper granulated. His buttock area on the left is just about closed however he has chronic inflammation with loss of the surface epithelial layer 3/28; 2-week follow-up. In clinic today with a new wound on the left anterior mid tibia. Says this happened about 2 weeks ago. He is not really sure how wonders about the spasticity of his legs at night whether that could have caused this other than that he does not have a good idea. He has been using topical antibiotics and silver alginate. The area on his right dorsal ankle seems somewhat better. Finally everything on his left buttock is closed. 4/11; 2-week follow-up. All of his wounds are better except for the area over the ischium and left buttock which have opened up widely again. At least part of this is covered in necrotic fibrinous material another part had rolled nonviable skin. The area on the right ankle, left anterior mid tibia are both a lot better. He had no open wounds on either foot including the areas between the first and second toes 4/25; patient presents for 2-week follow-up. He  states that the wounds are overall stable. He has no complaints today and states he is using Hydrofera Blue to open wounds. 5/9; have not seen this man in over a month. For my memory he has open areas on the left mid tibia and right ankle. T oday he has new open area on the right dorsal foot which we have not had a problem with recently. He has the sustained area on the left buttock He is also changed his insurance at the beginning of the year [employer]. We will need prior authorizations for debridement 5/23; patient presents for 2-week follow-up. He has prior authorizations for debridement. He denies any issues in the past 2 weeks with his wound care. He has been using Hydrofera Blue to all  the wounds. He does report a circular rash to the upper left leg that is new. He denies acute signs of infection. 6/6; 2-week follow-up. The patient has open wounds on the left buttock which are worse than the last time I saw this about a month ago. He also has a new area to me on the left anterior mid tibia with some surrounding erythema. The area on the dorsal ankle on the right is closed but I think this will be a friction injury every time this area is exposed to either our wraps or his compression stockings caused by unrelenting spasms in this leg. 6/20; 2-week follow-up. The patient has open wounds on the left buttock which is about the same. Using Hydrofera Blue here. - The left mid tibia has a static amount of surrounding erythema. Also a raised area in the center. We have been using Hydrofera Blue here. Finally he has broken down in his dorsal right foot extending between the first and second toes and going to the base of the first and second toe webspace. I have previously assumed that this was severe venous hypertension 7/5; 2-week follow-up The left buttock wound actually looks better. We are using Hydrofera Blue. He has extensive skin irritation around this area and I have not really been able to get that any better. I have tried Lotrisone i.e. antifungals and steroids. More most recently we have just been using Coloplast really looks about the same. The left mid tibia which was new last week culture to have very resistant staph aureus. Not only methicillin-resistant but doxycycline  resistant. The patient has a plethora of antibiotic allergies including sulfa, linezolid. I used topical bacitracin  on this but he has not started this yet. In addition he has an expanding area of erythema with a wound on the dorsal right foot. I did a deep tissue culture of this area today 7/12; Left buttock area actually looks better surrounding skin also looks less irritated. Left mid tibia looks about  the same. He is using bacitracin  this is not worse Right dorsal foot looks about the same as well. The left first toe also looks about the same 7/19; left buttock wound continues to improve in terms of open areas Left mid tibia is still concerning amount of swelling he is using bacitracin  Dorsal left first toe somewhat smaller Right dorsal foot somewhat smaller 7/25; left buttock wound actually continues to improve Left mid tibia area has less swelling. I gave him all my samples of new Nuzyra . This seems to have helped although the wound is still open it. His abrasion closed by here Left dorsal great toe really no better. Still a very nonviable surface Right dorsal foot perhaps some better. We have been  using bacitracin  and silver nitrate to the areas on his lower legs and Hydrofera Blue to the area on the buttock. 8/16 Disappointed that his left buttock wound is actually more substantial. Apparently during the last nurse visit these were both very small. He has continued irritation to a large area of skin on his buttock. I have never been able to totally explain this although I think it some combination of the way he sits, pressure, moisture. He is not incontinent enough to contribute to this. Left dorsal great toe still fibrinous debris on the surface that I have debrided today Large area across the dorsal right toes. The area on the left anterior mid tibia has less swelling. He completed the Nuzyra . This does not look infected although the tissue is still fried 8/30; 2-week follow-up. Left buttock areas not improved. We used Hydrofera Blue on this. Weeping wet with the surrounding erythema that I have not been able to control even with Lotrisone and topical Coloplast Left dorsal great toe looks about the same More substantial area again at the base of his toes on the left which is new this week. Area across the dorsal right toes looks improved The left anterior mid tibia looks like it is  trying to close 9/13; 2-week follow-up. Using silver alginate on all of his wounds. The left dorsal foot does not look any better. He has the area on the dorsal toe and also the areas at the base of all of his toes 1 through 3. On the right foot he has a similar pattern in a similar area. He has the area on his left mid tibia that looks fairly healthy. Finally the area on his left buttock looks somewhat bette 9/20; culture I did of the left foot which was a deep tissue culture last time showed E. coli he has erythema around this wound. Still a completely necrotic surface. His right dorsal foot looks about the same. He has a very friable surface to the left anterior mid tibia. Both buttock wounds look better. We have been Nobis, Raford E ([id number]) [phone].pdf Page 16 of 40 using silver alginate to all wounds 10/4; he has completed the cephalexin  that I gave him last time for the left foot. He is using topical gentamicin under silver alginate silver alginate being applied to all the wounds. Unfortunately all the wounds look irritated on his dorsal right foot dorsal left foot left mid tibia. I wonder if this could be a silver allergy. I am going to change him to Hydrofera Blue on the lower extremity. The skin on the left buttock and left posterior thigh still flaking dry and irritated. This has continued no matter what I have applied topically to this. He has a solitary open wound which by itself does not look too bad however the entire area of surrounding skin does not change no matter what we have applied here 10/18; the area on the left dorsal foot and right dorsal foot both look better. The area on the right extends into the plantar but not between his toes. We have been using silver alginate. He still has a rectangular erythematous area around the area on the left tibia. The wound itself is very small. Finally everything on his left buttock looks a little larger the skin  is erythematous 11/15; patient comes in with the left dorsal and right dorsal foot distally looking somewhat better. Still nonviable surface on the left foot which required debridement. He still has the area on the  left anterior mid tibia although this looks somewhat better. He has a new area on the right lateral lower leg just above the ankle. Finally his left buttock looks terrible with multiple superficial open wounds geometric square shaped area of chronic erythema which I have not been able to sort through 11/29; right dorsal foot and left dorsal foot both look somewhat better. No debridement required. He has the fragile area on the left anterior mid tibia this looks and continues to look somewhat better. Right lateral lower leg just above the ankle we identified last time also looks better. In general the area on his left buttock looks improved. We are using Hydrofera Blue to all wound areas 12/13; right dorsal foot looks better. The area on the right lateral leg is healed. Left dorsal foot has 2 open areas both of which require debridement. The fragile area on the left anterior mid tibial looks better. Smaller area on his buttocks. Were using Hydrofera Blue 1/10; patient comes in with everything looking slightly larger and/or worse. This includes his left buttock, reopening of the left mid tibia, larger areas on the left dorsal foot and what looks to be a cellulitis on the right dorsal and plantar foot. We have been using Hydrofera Blue on all wounds. 1/17; right dorsal foot distally looks better today. The left foot has 2 open wounds that are about the same surrounding erythema. Culture I did last week showed rare Enterococcus and a multidrug resistant MRSA. 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 No malignancy they did not stain for fungus 1/24; his right distal foot is not closed dry and scaly but the wound looks like it is  contracting. I did not debride anything here. Problem on his left dorsal foot with expanding erythema. Apparently there were problems last week getting the Nuzyra  however it is now available at the [facility] but a week later. He is using ketoconazole and Coloplast to the left buttock along with Hydrofera Blue this actually looks quite a bit better today. 1/31; right dorsal foot again is dry and scaly but looking to contract. He has been using a moisturizer on his feet at my request but he is not sure which 1. The left dorsal foot wounds look about the same there is erythema here that I marked last week however after course of Nuzyra  it certainly is not any better but not any worse either. Finally on his left buttock the skin continues to look better he has the original wound but a new substantial area towards the gluteal cleft. Almost like a skin tear. I used scissors to remove skin and subcutaneous tissue here silver nitrate and direct pressure 2/7; right dorsal foot. This does not look too much different from last week. Some erythema skin dry and scaly. No debridement. Left dorsal great toe again still not much improvement. I did remove flaking dry skin and callus from around the edge. Finally on his left buttock. The skin is somewhat better in the periwound. Surface wounds are superficial somewhat better than last week. 01/26/2022: Is a little bit of a mystery as to why his wounds fail to respond to treatments and actually seem to get worse. This is my first encounter with this patient. He was previously followed by Dr. Rufus. Based upon my review of the chart, it seems that there is a little bit of a mystery as to why his wounds do not respond as anticipated to the interventions applied  and sometimes even get worse. Biopsies have been performed and he was seen by dermatology in [place], but that did not shed any light on the matter. T oday, his gluteal wound is larger, with substantial  drainage, rather malodorous. The food wounds are not terrible, but he has a lot of callus and scaly skin around these. He is currently getting silver alginate on the gluteal wound, with idodoflex to the feet. He is using lotrisone on his legs for the dry, scaly skin. 02/09/2022: There has really been no change to any of his wounds. The gluteal wound less drainage and odor, but remains about the same size, the periwound skin remains oddly scaly. His lower extremity wounds also appear roughly the same size. They continue to accumulate a small amount of slough. The periwound on his feet and ankle wounds has dry eschar and loose dead skin. We have been using silver alginate on the gluteal wound and Iodoflex on his feet and ankle wounds. T the periwound around his gluteal lesion and Lotrisone on his feet and legs. o 02/23/2022: The right plantar foot wound is closed. The gluteal site looks small but has continued to produce hypertrophic granulation tissue. The foot wounds all look about the same on the dorsal surface of the right foot; on the left, there is only a small open area at the site of where his left great toenail would have been. 03/16/2022: The right ankle wound is healed. The right dorsal foot wound is about the same. The left dorsal foot wound is quite a bit smaller and the ischial wound is nearly closed. 03/30/2022: The right ankle wound reopened. Both dorsal foot wounds are quite a bit smaller. Unfortunately, he appears to have sheared part of his ischial wound open further, perhaps during a transfer. 04/13/2022: The right ankle wound has hypertrophic granulation tissue present. The dorsal foot wounds continue to decrease in size. The ischial wound looks about the same today, no better, no worse. 04/27/2022: The right ankle wound has closed. Unfortunately, it looks like some moisture got underneath the dry skin on both of his dorsal feet and these wounds have expanded in size. The ischial wound  remains the same with perhaps a little bit more slough accumulation than at our previous visit. 05/11/2022: The right ankle wound remains closed. There is a left anterior tibial wound that is small has patchy openings with accumulated slough. The dorsum of his right foot appears to be nearly healed with just a small punctate opening. The plantar surface of his right foot has a new opening that looks like he may have picked some skin there. His sacral ulcer has hypertrophic granulation tissue but has some slough accumulation. The dorsum of his left foot has multiple open areas in a fairly ragged distribution. All of these have slough accumulated within them. 06/01/2022: The right ankle and left anterior tibial wound are both closed. Dorsum of his right foot and left foot both look substantially better with just tiny scattered openings The without any slough accumulation. He has sheared open new areas on his left gluteus and ischium. He says that his wheelchair cushion, which is air-filled, has a leak and so it keeps deflating. He is awaiting a new cushion. 06/15/2022: The right ankle wound has reopened and the fat layer is exposed. Both dorsal feet have just small openings with just a little bit of slough and eschar accumulation. The wound on his left gluteus and ischium is larger again today and has a foul  odor. 06/29/2022: The right ankle wound has hypertrophic granulation tissue buildup. His dorsal foot wounds both look better with just some eschar on the surface. He has a new wound on his left lateral ankle. He is not sure how he acquired it but by appearance, it looks that he hit it on something, potentially his wheelchair or bed. The ischial wound is about the same but is cleaner without any significant purulent drainage or odor. He did not understand what the [facility] call was about and therefore he does not have the topical compounded antibiotic. 07/13/2022: The right ankle wound again has  hypertrophic granulation tissue, but less so than at his previous visit. The ischial wound has improved tremendously the use of the [facility] topical antibiotic. No significant change to the left lateral ankle wound; it is fibrotic with slough present. The skin of both Goswick, Ashly E ([id number]) [phone].pdf Page 17 of 40 of his feet, especially on the right, has a yeasty appearance. 08/10/2022: There is again hypertrophic granulation tissue on the right anterior ankle wound. Both feet are about the same. The left lateral ankle wound is a little bit desiccated and has some slough buildup. He unfortunately suffered a new injury when he was removing his pants and they caught his bandage which caused a large skin tear on his left ischium, just distal to the existing wound. The existing left ischial wound, however, is significantly better with just a little light slough on the surface. 08/31/2022: The right anterior ankle wound is a lot smaller today underneath some eschar. No accumulation of hypertrophic granulation tissue. The left lateral ankle wound has some slough on the surface but is better in terms of moisture balance this week. The left dorsal foot does not really have any openings on it today. The right dorsal foot has some slough and eschar accumulation. His gluteal ulcer is basically closed aside from 2 small areas that are oozing a bit. 09/21/2022: The right anterior ankle wound is down to just a tiny pinhole. The left lateral ankle wound has accumulated slough again, but moisture balance is good. Left and right dorsal feet both have 2 small openings with some slough in them. The gluteal ulcer, unfortunately has opened up substantially. 10/05/2022: The right anterior ankle wound has reopened and has a fair amount of slough on the surface. The left lateral ankle wound has accumulated more slough, as well. He was approved for Apligraf, but the wound is not clean enough  yet. There is some slough buildup on the dorsal right foot wound, as well as on his ischium. 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 10/26/2022: The right anterior ankle wound has closed again. The left lateral ankle wound looks quite a bit better. It is ready for Apligraf but we do not have 1 here for him. His ischium continues to get worse, with the wound larger and deeper. It really seems like this is a combination of pressure and friction. He has 2 new wounds, 1 on the plantar aspect of each foot. He says that he had some dry skin there that peeled away. Both are superficial. The dorsum of his left foot does not have any new wound opening. The dorsum of his right foot has a bit of slough accumulation. 11/24/2022: The right anterior ankle wound has 2 small open areas, both covered with eschar. The left lateral ankle wound has closed and considerably with just a little  slough on the surface. The ischium has improved most significantly, having closed in most of the open area with just a few small remaining superficial openings. The dorsal foot wounds are small and superficial. The bottoms of his feet are closed. 12/07/2022: The right anterior ankle wound remains open with 2 small areas, again with slough and eschar present. The left lateral ankle wound is down to about half a centimeter with just some slough on the surface. His right dorsal foot has opened up more and has both slough and eschar present. The ischium has continued to improve and now just has 2 small open areas that are very clean. 12/21/2022: He has a new wound on his right lateral ankle. There is some slough present. The left lateral ankle is quite a bit smaller with some slough buildup. His left ischial ulcer is also nearly closed; there is just a tear in his skin that seems likely to be secondary to a transfer mishap. He has a new ulcer in his natal cleft that looks like it  may be secondary to moisture. There is some slough in this area as well. The right anterior ankle is nearly closed. The right dorsal foot wound has some slough accumulation 01/04/2023: The anterior right ankle wound is closed, as is his ischial ulcer. The left lateral ankle wound is nearly closed with just a little bit of slough accumulation. The ulcer in his natal cleft is about the same size with slough buildup there, as well. He has a new wound on his left lateral leg near the knee from rubbing on his wheelchair. There is slough present, but no signs of infection. The right dorsal foot wound has a fairly heavy layer of slough buildup, as well. 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 He has been using his old wheelchair cushion for reasons that are unclear. The ulcer in his natal cleft has healed. The wound on his left lateral leg near the knee is smaller with some slough present. The right dorsal foot wound has accumulated slough again. The right lateral ankle wound is healed. The left lateral ankle wound is smaller again this week. 02/01/2023: His ischial ulcer has had a ton of drainage over the last 2 weeks and the drainage has an absolutely putrid odor. He has gone back to using his new wheelchair chair cushion, though. The wound on his left lateral leg near his knee has healed. The left lateral ankle wound is nearly healed. Both the dorsal foot wounds are larger and have a lot of slough accumulation and drainage. The wound on his right anterior ankle is healed but there is a new 1 just proximal to this, also with slough and eschar present. 02/15/2023:The left lateral and right lateral ankle wounds are both smaller. The right anterior ankle wound has some eschar on the surface. Both dorsal feet have a lot of slough accumulation. The ischial ulcer has epithelialized in multiple areas, leaving several smaller areas that are quite friable and still with a pungent odor. His new [facility]  prescription should be delivered tomorrow. We are using silver alginate on everything at this point. 03/08/2023: Both dorsal foot wounds are about the same size and have a lot of slough buildup. The ischial ulcer has improved quite a bit. The odor has abated and the surfaces are clean. It is still quite friable and 1 area is oozing. The left lateral and right lateral ankle wounds are nearly closed. The right anterior ankle wound is healed.  03/22/2023: The right lateral ankle wound is healed. The left lateral ankle wound is tiny with just a little bit of slough accumulation. The dorsal foot wounds look about the same. The ischial ulcer is considerably smaller and there is no odor. There is slight slough accumulation. 04/14/2023: The right lateral ankle wound has reopened. It is fairly superficial but does have some hematoma in the tissue suggesting trauma or pressure as the etiology. The left lateral ankle wound is essentially a pinhole under some dry eschar. The dorsal foot wounds are stable. The ischial ulcer, unfortunately, has expanded to 5 separate openings in his skin. The wounds are clean but friable. 04/28/2023: The left lateral ankle wound has expanded and is a little bit deeper today. Dorsal foot wounds are stable. The ischial ulcer is less friable and does not have the strong odor that has been present. The right lateral ankle wound is a little bit smaller with some slough and eschar accumulation. 05/10/2023: The total area of the ischial wound is smaller. The left lateral ankle wound is nearly closed. Both dorsal foot wounds are quite a bit smaller. The right ankle wound has healed. 05/31/2023: The left lateral leg wound is down to just a superficial small area that almost looks like a scrape rather than the deeper ulcer it has been. His ischial ulcer is down to just 1 open spot. It is clean but quite friable. Both dorsal foot ulcers are considerably smaller and have much less slough than  usual. 06/21/2023: His ischial ulcer is still just 1 open area, but it is a little larger today. The surrounding skin, however, is in better condition Unfortunately, the left lateral leg wound is a little bit larger today. It looks as though some dry skin was peeled away. Both dorsal foot ulcers continue to contract; they are the smallest I have ever seen them and have only minimal slough on the surface. 07/12/2023: All of his wounds have deteriorated and his right anterior ankle ulcer is open again. He has had a lot more drainage, although he states it is clear and does not have any significant odor. This is resulted in breakdown around the left lateral ankle ulcer, both dorsal feet and has extended to the plantar surface of both feet. His ischial ulcer is also larger; this appears to be more due to shear than anything else. 08/02/2023: All of his wounds look better. There does not appear to be any rhyme or reason as to why they wax and wane as they do. 08/23/2023: The right dorsal ankle wound is covered with a extremely thin layer of skin but is still purple underneath. The left lateral ankle wound has quite a bit of slough on it and it appears that the tendon is exposed again. His ischial ulcer is down to 4 open areas, all of which are smaller than previously, but the surfaces remain friable and have some slough on them today. The dorsal foot wounds are both considerably smaller and the right plantar ulcer is closed. The left plantar ulcer is nearly closed. 09/26/2023: The right dorsal ankle wound is basically healed. The left lateral ankle wound is smaller. His ischial ulcers have merged and are a bit larger today. Both dorsal foot wounds are stable. The left plantar ulcer is closed but the right plantar ulcer has reopened. 12/3; the patient comes in today for what I gather is monthly follow-up. Unfortunately things are are not going very well. He has multiple wounds including on Runquist, Estil E  (  [id number]) [id number] Page 18 of 40 his right dorsal foot left dorsal foot left lateral lower extremity left posterior lower extremity left buttock and right anterior lower extremity. The buttock wound which I gather is a pressure ulcer in this paraplegic patient is quite a bit worse at the top and area of necrotic granulation tissue. He has been using mupirocin  silver alginate to all the wounds and his own compression stockings in his legs. I am not sure how much he keeps his legs elevated but he has been coached on this before 11/07/2023: Apparently the buttock area looks better today than it did at his previous visit. There is no necrosis present. It is still larger than when I saw it in November. The left lateral ankle wound is larger and appears deeper under a thick layer of slough. Both feet have deteriorated and the wounds are larger with slough build up. The right anterior ankle wound is open again. 11/22/2023: All of the wounds are looking better today. The lateral left leg wound and left plantar foot wounds have epithelialized significantly. There are fewer open areas on the ischium and these all look healthier. The dorsal foot wounds and left ankle wound have their usual accumulation of slough. 12/06/2023: The wound on he lateral left knee is healed. The left ankle wound is larger and has quite a bit of slough. The dorsal foot wounds are smaller again, but have accumulated thick slough. The plantar surface of the right foot has broken down a bit and has a fungal odor to it. The right anterior ankle wound is smaller. The ischial area looks better than usual. Electronic Signature(s) Signed: 12/06/2023 8:39:17 AM By: Marolyn Nest MD FACS Entered By: Marolyn Nest on 12/06/2023 08:39:17 -------------------------------------------------------------------------------- Physical Exam Details Patient Name: Date of Service: Insley, [name] 12/06/2023 7:45 A  M Medical Record Number: [id number] Patient Account Number: [id number] Date of Birth/Sex: Treating RN: 1986-03-26 (37 y.o. [name] Merleen Handing Primary Care Provider: [name], [name] Other Clinician: Referring Provider: Treating Provider/Extender: Marolyn Nest BRIDGE, [name]: 413 Constitutional . . . . no acute distress. Respiratory Normal work of breathing on room air.. Notes 12/06/2023: The wound on he lateral left knee is healed. The left ankle wound is larger and has quite a bit of slough. The dorsal foot wounds are smaller again, but have accumulated thick slough. The plantar surface of the right foot has broken down a bit and has a fungal odor to it. The right anterior ankle wound is smaller. The ischial area looks better than usual. Electronic Signature(s) Signed: 12/06/2023 8:40:07 AM By: Marolyn Nest MD FACS Entered By: Marolyn Nest on 12/06/2023 08:40:07 -------------------------------------------------------------------------------- Physician Orders Details Patient Name: Date of Service: Zuba, [name] 12/06/2023 7:45 A M Medical Record Number: [id number] Patient Account Number: [id number] Date of Birth/Sex: Treating RN: 30-Aug-1986 (37 y.o. 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 - Dr. Marolyn ***allow extra time*** Anesthetic Wound #52 Right,Dorsal Foot (In clinic) Topical Lidocaine  4% applied to wound bed Bathing/ Shower/ Hygiene May shower and wash wound with soap and water. - on days that dressing is changed Off-Loading Roho cushion for wheelchair - use newer cushion Turn and reposition every 2 hours - lift up with arms in wheelchair every hour during the day Wound Treatment Wound #52 - Foot Wound Laterality: Dorsal, Right Cleanser: Soap and Water Every Other Day/30 Days Discharge Instructions: May shower and wash wound with dial antibacterial soap and water prior to dressing change. Cleanser: Wound Cleanser Every Other Day/30 Days Discharge Instructions: Cleanse the wound with wound cleanser prior to applying a clean dressing using gauze sponges, not tissue or cotton balls. Peri-Wound Care: Ketoconazole Cream 2% Every Other Day/30 Days Discharge Instructions: Apply Ketoconazole to rashy areas Peri-Wound Care: Sween Lotion (Moisturizing lotion) Every Other Day/30 Days Discharge Instructions: Apply Aquaphor moisturizing lotion as directed Topical: Mupirocin  Ointment Every Other Day/30 Days Discharge Instructions: Apply Mupirocin  (Bactroban ) as instructed Prim Dressing: Maxorb Extra Ag+ Alginate Dressing, 2x2 (in/in) Every Other Day/30 Days ary Discharge Instructions: Apply to wound bed as instructed Secondary Dressing: Woven Gauze Sponge, Non-Sterile 4x4 in Every Other Day/30 Days Discharge Instructions: Apply over primary dressing as directed. Secured With: [employer], 4.5x3.1 (in/yd) (Generic) Every Other Day/30 Days Discharge Instructions: Secure with Kerlix as directed. Secured With: 41M Medipore H Soft Cloth Surgical T ape, 4 x 10 (in/yd) (Generic) Every Other Day/30  Days Discharge Instructions: Secure with tape as directed. Wound #56 - Foot Wound Laterality: Dorsal, Left Cleanser: Soap and Water Every Other Day/30 Days Discharge Instructions: May shower and wash wound with dial antibacterial soap and water prior to dressing change. Cleanser: Wound Cleanser Every Other Day/30 Days Discharge Instructions: Cleanse the wound with wound cleanser prior to applying a clean dressing using gauze sponges, not tissue or cotton balls. Peri-Wound Care: Ketoconazole Cream 2% Every Other Day/30 Days Discharge Instructions: Apply Ketoconazole to rashy areas Peri-Wound Care: Sween Lotion (Moisturizing lotion) Every Other Day/30 Days Discharge Instructions: Apply Aquaphor moisturizing lotion as directed Topical: Mupirocin  Ointment Every Other Day/30 Days Discharge Instructions: Apply Mupirocin  (Bactroban ) as instructed Prim Dressing: Maxorb Extra Ag+ Alginate Dressing, 2x2 (in/in) Every Other Day/30 Days ary Discharge Instructions: Apply to wound bed as instructed Secondary Dressing: Woven Gauze Sponge, Non-Sterile 4x4 in Every Other Day/30 Days Discharge Instructions: Apply over primary dressing as directed. Secured With: [employer], 4.5x3.1 (in/yd) (Generic) Every Other Day/30 Days Discharge Instructions: Secure with Kerlix as directed. Mckamie, Davonte E ([id number]) [phone].pdf Page 20 of 40 Secured With: 41M Medipore H Soft Cloth Surgical T ape, 4 x 10 (in/yd) (Generic) Every Other Day/30 Days Discharge Instructions: Secure with tape as directed. Wound #67 - Lower Leg Wound Laterality: Left, Lateral Cleanser: Soap and Water Every Other Day/30 Days Discharge Instructions: May shower and wash wound with dial antibacterial soap and water prior to dressing change. Cleanser: Wound Cleanser Every Other Day/30 Days Discharge Instructions: Cleanse the wound with wound cleanser prior to applying a clean dressing using gauze sponges, not tissue  or cotton balls. Peri-Wound Care: Ketoconazole Cream 2% Every Other Day/30 Days Discharge Instructions: Apply Ketoconazole to rashy areas Peri-Wound Care: Sween Lotion (Moisturizing lotion) Every Other Day/30 Days Discharge Instructions: Apply Aquaphor moisturizing lotion as directed Topical: Mupirocin  Ointment Every Other Day/30 Days Discharge Instructions: Apply Mupirocin  (Bactroban ) as instructed Prim  Dressing: Maxorb Extra Ag+ Alginate Dressing, 2x2 (in/in) Every Other Day/30 Days ary Discharge Instructions: Apply to wound bed as instructed Secondary Dressing: Woven Gauze Sponge, Non-Sterile 4x4 in Every Other Day/30 Days Discharge Instructions: Apply over primary dressing as directed. Secured With: [employer], 4.5x3.1 (in/yd) (Generic) Every Other Day/30 Days Discharge Instructions: Secure with Kerlix as directed. Secured With: 28M Medipore H Soft Cloth Surgical T ape, 4 x 10 (in/yd) (Generic) Every Other Day/30 Days Discharge Instructions: Secure with tape as directed. Wound #74 - Upper Leg Wound Laterality: Left, Posterior Cleanser: Vashe 5.8 (oz) 1 x Per Day/30 Days Discharge Instructions: Cleanse the wound with Vashe prior to applying a clean dressing using gauze sponges , let sit on wound for 10 minutes Peri-Wound Care: Zinc Oxide Ointment 30g tube 1 x Per Day/30 Days Discharge Instructions: Apply Zinc Oxide to periwound with each dressing change as needed fpr moisture Topical: Mupirocin  Ointment 1 x Per Day/30 Days Discharge Instructions: Apply Mupirocin  (Bactroban ) as instructed Topical: [facility] antibiotic compound 1 x Per Day/30 Days Prim Dressing: Maxorb Extra Ag+ Alginate Dressing, 4x4.75 (in/in) 1 x Per Day/30 Days ary Discharge Instructions: Apply to wound bed as instructed Secondary Dressing: Zetuvit Plus Silicone Border Sacrum Dressing, Sm, 7x7 (in/in) 1 x Per Day/30 Days Discharge Instructions: Apply silicone border over primary dressing as directed. Secured  With: 28M Medipore H Soft Cloth Surgical T ape, 4 x 10 (in/yd) 1 x Per Day/30 Days Discharge Instructions: Secure with tape as directed. Wound #78 - Ankle Wound Laterality: Right, Anterior Cleanser: Soap and Water Every Other Day/30 Days Discharge Instructions: May shower and wash wound with dial antibacterial soap and water prior to dressing change. Cleanser: Wound Cleanser Every Other Day/30 Days Discharge Instructions: Cleanse the wound with wound cleanser prior to applying a clean dressing using gauze sponges, not tissue or cotton balls. Peri-Wound Care: Ketoconazole Cream 2% Every Other Day/30 Days Discharge Instructions: Apply Ketoconazole to rashy areas Peri-Wound Care: Sween Lotion (Moisturizing lotion) Every Other Day/30 Days Discharge Instructions: Apply Aquaphor moisturizing lotion as directed Topical: Mupirocin  Ointment Every Other Day/30 Days Discharge Instructions: Apply Mupirocin  (Bactroban ) as instructed Prim Dressing: Maxorb Extra Ag+ Alginate Dressing, 2x2 (in/in) Every Other Day/30 Days ary Discharge Instructions: Apply to wound bed as instructed Secondary Dressing: Woven Gauze Sponge, Non-Sterile 4x4 in Every Other Day/30 Days Discharge Instructions: Apply over primary dressing as directed. Secured With: [employer], 4.5x3.1 (in/yd) (Generic) Every Other Day/30 Days Discharge Instructions: Secure with Kerlix as directed. Kistner, Braxxton E ([id number]) [phone].pdf Page 21 of 40 Secured With: 28M Medipore H Soft Cloth Surgical T ape, 4 x 10 (in/yd) (Generic) Every Other Day/30 Days Discharge Instructions: Secure with tape as directed. Wound #81 - Foot Wound Laterality: Plantar, Right Cleanser: Soap and Water Every Other Day/30 Days Discharge Instructions: May shower and wash wound with dial antibacterial soap and water prior to dressing change. Cleanser: Wound Cleanser Every Other Day/30 Days Discharge Instructions: Cleanse the wound with  wound cleanser prior to applying a clean dressing using gauze sponges, not tissue or cotton balls. Peri-Wound Care: Ketoconazole Cream 2% Every Other Day/30 Days Discharge Instructions: Apply Ketoconazole to rashy areas Peri-Wound Care: Sween Lotion (Moisturizing lotion) Every Other Day/30 Days Discharge Instructions: Apply Aquaphor moisturizing lotion as directed Topical: Mupirocin  Ointment Every Other Day/30 Days Discharge Instructions: Apply Mupirocin  (Bactroban ) as instructed Prim Dressing: Maxorb Extra Ag+ Alginate Dressing, 2x2 (in/in) Every Other Day/30 Days ary Discharge Instructions: Apply to wound bed as instructed Secondary Dressing: Woven Gauze Sponge, Non-Sterile  4x4 in Every Other Day/30 Days Discharge Instructions: Apply over primary dressing as directed. Secured With: [employer], 4.5x3.1 (in/yd) (Generic) Every Other Day/30 Days Discharge Instructions: Secure with Kerlix as directed. Secured With: 87M Medipore H Soft Cloth Surgical T ape, 4 x 10 (in/yd) (Generic) Every Other Day/30 Days Discharge Instructions: Secure with tape as directed. Patient Medications llergies: penicillin, Sulfa (Sulfonamide Antibiotics), Levaquin, meropenem , Zyvox A Notifications Medication Indication Start End 12/06/2023 ketoconazole DOSE topical 2 % cream - apply thin layer to affected area with dressing changes Electronic Signature(s) Signed: 12/06/2023 10:09:11 AM By: Marolyn Nest MD FACS Previous Signature: 12/06/2023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 gradual improvement in the refractory left lateral venous insufficiency wound on the left. Dimension smaller. Surface still requiring debridement using Hydrofera Blue 05/19/17; continued gradual improvement in the refractory left lateral venous ulceration. Careful inspection of the wound bed underlying rumination suggested some degree of epithelialization over the surface no debridement indicated. Continue Hydrofera Blue difficult areas between his toes first and third on the left than first and second on the right. I'm going to change to silver alginate from silver collagen. Continue ketoconazole as I suspect underlying tinea pedis 05/26/17; left lateral leg venous insufficiency wound. We've been  using Hydrofera Blue. I believe that there is expanding epithelialization over the surface of the wound albeit not coming from the wound circumference. This is a bit of an odd situation in which the epithelialization seems to be coming from the surface of the wound rather than in the exact circumference. There is still small open areas mostly along the lateral margin of the wound. He has unchanged areas between the left first and second and the right first second toes which I been treating for tenia pedis 06/02/17; left lateral leg venous insufficiency wound. We have been using Hydrofera Blue. Somewhat smaller from the wound circumference. The surface of the wound remains a bit on it almost epithelialized sedation in appearance. I use an open curette today debridement in the surface of all of this especially the edges Small open wounds remaining on the dorsal right first and second toe interspace and the plantar  left first second toe and her face on the left 06/09/17; wound on the left lateral leg continues to be smaller but very gradual and very dry surface using Hydrofera Blue 06/16/17 requires weekly debridements now on the left lateral leg although this continues to contract. I changed to silver collagen last week because of dryness of the wound bed. Using Iodoflex to the areas on his first and second toes/web space bilaterally 06/24/17; patient with history of paraplegia also chronic venous insufficiency with lymphedema. Has a very difficult wound on the left lateral leg. This has been gradually reducing in terms of with but comes in with a very dry adherent surface. High switch to silver collagen a week or so ago with hydrogel to keep the area moist. This is been refractory to multiple dressing attempts. He also has areas in his first and second toes bilaterally in the anterior and posterior web space. I had been using Iodoflex here after a prolonged course of silver alginate with ketoconazole was ineffective [question tinea pedis] 07/14/17; patient arrives today with a very difficult adherent material over his left lateral lower leg wound. He also has surrounding erythema and poorly controlled edema. He was switched his Santyl  last visit which the nurses are applying once during his doctor visit and once on a nurse visit. He was also reduced to 2 layer compression I'm not exactly sure of the issue here. 07/21/17; better surface today after 1 week of Iodoflex. Significant cellulitis that we treated last week also better. [Doxycycline ] 07/28/17 better surface today with now 2 weeks of Iodoflex. Significant cellulitis treated with doxycycline . He has now completed the doxycycline  and he is back to his usual degree of chronic venous inflammation/stasis dermatitis. He reminds me he has had ablations surgery here 08/04/17; continued improvement with Iodoflex to the left lateral leg wound in terms of the surface of the  wound although the dimensions are better. 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 Problematic areas on the plantar aspect of the first second toe web space on the left and the dorsal aspect of the first second toe web space on the right. At one point I felt these were probably related to chronic fungal infections in treated him aggressively for this although we have not made any improvement here. 08/11/17; left lateral leg. Surface continues to improve with the Iodoflex although we are not seeing much improvement in overall wound dimensions. Areas on his plantar left foot and right foot show no improvement. In fact the right foot looks somewhat worse  08/18/17; left lateral leg. We changed to Hydrofera Blue last week after a prolonged course of Iodoflex which helps get the surface better. It appears that the wound with is improved. Continue with difficult areas on the left dorsal first second and plantar first second on the right 09/01/17; patient arrives in clinic today having had a temperature of 103 yesterday. He was seen in the ER and [facility]. The patient was concerned he could have cellulitis again in the right leg however they diagnosed him with a UTI and he is now on Keflex . He has a history of cellulitis which is been recurrent and difficult but this is been in the left leg, in the past 5 use doxycycline . He does in and out catheterizations at home which are risk factors for UTI 09/08/17; patient will be completing his Keflex  this weekend. The erythema on the left leg is considerably better. He has a new wound today on the medial part Monroy, Clive E ([id number]) [phone].pdf Page 26 of 40 of the right leg small superficial almost looks like a skin tear. He has worsening of the area on the right dorsal first and second toe. His major area on the left lateral leg is better. Using Hydrofera Blue on all  areas 09/15/17; gradual reduction in width on the long wound in the left lateral leg. No debridement required. He also has wounds on the plantar aspect of his left first second toe web space and on the dorsal aspect of the right first second toe web space. 09/22/17; there continues to be very gradual improvements in the dimensions of the left lateral leg wound. He hasn't round erythematous spot with might be pressure on his wheelchair. There is no evidence obviously of infection no purulence no warmth He has a dry scaled area on the plantar aspect of the left first second toe Improved area on the dorsal right first second toe. 09/29/17; left lateral leg wound continues to improve in dimensions mostly with an is still a fairly long but increasingly narrow wound. He has a dry scaled area on the plantar aspect of his left first second toe web space Increasingly concerning area on the dorsal right first second toe. In fact I am concerned today about possible cellulitis around this wound. The areas extending up his second toe and although there is deformities here almost appears to abut on the nailbed. 10/06/17; left lateral leg wound continues to make very gradual progress. Tissue culture I did from the right first second toe dorsal foot last time grew MRSA and enterococcus which was vancomycin  sensitive. This was not sensitive to clindamycin  or doxycycline . He is allergic to Zyvox and sulfa we have therefore arrange for him to have dalvance  infusion tomorrow. He is had this in the past and tolerated it well 10/20/17; left lateral leg wound continues to make decent progress. This is certainly reduced in terms of with there is advancing epithelialization.The cellulitis in the right foot looks better although he still has a deep wound in the dorsal aspect of the first second toe web space. Plantar left first toe web space on the left I think is making some progress 10/27/17; left lateral leg wound continues  to make decent progress. Advancing epithelialization.using Hydrofera Blue The right first second toe web space wound is better-looking using silver alginate Improvement in the left plantar first second toe web space. Again using silver alginate 11/03/17 left lateral leg wound continues to make decent progress albeit slowly. Using Hydrofera Blue The right per  second toe web space continues to be a very problematic looking punched out wound. I obtained a piece of tissue for deep culture I did extensively treated this for fungus. It is difficult to imagine that this is a pressure area as the patient states other than going outside he doesn't really wear shoes at home The left plantar first second toe web space looked fairly senescent. Necrotic edges. This required debridement change to [facility] Blue to all wound areas 11/10/17; left lateral leg wound continues to contract. Using Hydrofera Blue On the right dorsal first second toe web space dorsally. Culture I did of this area last week grew MRSA there is not an easy oral option in this patient was multiple antibiotic allergies or intolerances. This was only a rare culture isolate I'm therefore going to use Bactroban  under silver alginate On the left plantar first second toe web space. Debridement is required here. This is also unchanged 11/17/17; left lateral leg wound continues to contract using Hydrofera Blue this is no longer the major issue. The major concern here is the right first second toe web space. He now has an open area going from dorsally to the plantar aspect. There is now wound on the inner lateral part of the first toe. Not a very viable surface on this. There is erythema spreading medially into the forefoot. No major change in the left first second toe plantar wound 11/24/17; left lateral leg wound continues to contract using Hydrofera Blue. Nice improvement today The right first second toe web space all of this looks a lot less angry  than last week. I have given him clindamycin  and topical Bactroban  for MRSA and terbinafine for the possibility of underlining tinea pedis that I could not control with ketoconazole. Looks somewhat better The area on the plantar left first second toe web space is weeping with dried debris around the wound 12/01/17; left lateral leg wound continues to contract he Hydrofera Blue. It is becoming thinner in terms of with nevertheless it is making good improvement. The right first second toe web space looks less angry but still a large necrotic-looking wounds starting on the plantar aspect of the right foot extending between the toes and now extensively on the base of the right second toe. I gave him clindamycin  and topical Bactroban  for MRSA anterior benefiting for the possibility of underlying tinea pedis. Not looking better today The area on the left first/second toe looks better. Debrided of necrotic debris 12/05/17* the patient was worked in urgently today because over the weekend he found blood on his incontinence bad when he woke up. He was found to have an ulcer by his wife who does most of his wound care. He came in today for us  to look at this. He has not had a history of wounds in his buttocks in spite of his paraplegia. 12/08/17; seen in follow-up today at his usual appointment. He was seen earlier this week and found to have a new wound on his buttock. We also follow him for wounds on the left lateral leg, left first second toe web space and right first second toe web space 12/15/17; we have been using Hydrofera Blue to the left lateral leg which has improved. The right first second toe web space has also improved. Left first second toe web space plantar aspect looks stable. The left buttock has worsened using Santyl . Apparently the buttock has drainage 12/22/17; we have been using Hydrofera Blue to the left lateral leg which continues to  improve now 2 small wounds separated by normal skin. He  tells us  he had a fever up to 100 yesterday he is prone to UTIs but has not noted anything different. He does in and out catheterizations. The area between the first and second toes today does not look good necrotic surface covered with what looks to be purulent drainage and erythema extending into the third toe. I had gotten this to something that I thought look better last time however it is not look good today. He also has a necrotic surface over the buttock wound which is expanded. I thought there might be infection under here so I removed a lot of the surface with a #5 curet though nothing look like it really needed culturing. He is been using Santyl  to this area 12/27/17; his original wound on the left lateral leg continues to improve using Hydrofera Blue. I gave him samples of Baxdella although he was unable to take them out of fear for an allergic reaction [lump in his throat].the culture I did of the purulent drainage from his second toe last week showed both enterococcus and a set Enterobacter I was also concerned about the erythema on the bottom of his foot although paradoxically although this looks somewhat better today. 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 We've been using silver alginate here. They came up today that he sleeps in a recliner, I'm not sure why but I asked him to stop this 01/03/18; his original wound we've been using Hydrofera Blue is now separated into 2 areas. Ulcer on his left buttock is better he is off the recliner and sleeping in bed Finally both wound areas between his first and second toes also looks some better 01/10/18; his original wound on the left lateral leg is now separated into 2 wounds we've been using Hydrofera Blue Ulcer on his left buttock has some drainage. There is a small probing site going into muscle layer superiorly.using silver alginate -He arrives  today with a deep tissue injury on the left heel The wound on the dorsal aspect of his first second toe on the left looks a lot betterusing silver alginate ketoconazole The area on the first second toe web space on the right also looks a lot bette 01/17/18; his original wound on the left lateral leg continues to progress using Hydrofera Blue Ulcer on his left buttock also is smaller surface healthier except for a small probing site going into the muscle layer superiorly. 2.4 cm of tunneling in this area DTI on his left heel we have only been offloading. Looks better than last week no threatened open no evidence of infection the wound on the dorsal aspect of the first second toe on the left continues to look like it's regressing we have only been using silver alginate and terbinafine orally The area in the first second toe web space on the right also looks to be a lot better using silver alginate and terbinafine I think this was prompted by tinea pedis 01/31/18; the patient was hospitalized in [place] last week apparently for a complicated UTI. He was discharged on cefepime  he does in and out catheterizations. In the hospital he was discovered M I don't mild elevation of AST and ALT and the terbinafine was stopped.predictably the pressure ulcer on s his buttock looks betterusing silver alginate. The area on the left lateral leg also is better using Hydrofera Blue.  The area between the first and second toes on the left better. First and second toes on the right still substantial but better. Finally the DTI on the left heel has held together and looks like it's resolving 02/07/18-he is here in follow-up evaluation for multiple ulcerations. He has new injury to the lateral aspect of the last issue a pressure ulcer, he states this is from adhesive removal trauma. He states he has tried multiple adhesive products with no success. All other ulcers appear stable. The left heel DTI is resolving. We will  continue with same treatment plan and follow-up next week. 02/14/18; follow-up for multiple areas. Riecke, Tranquilino E ([id number]) [phone].pdf Page 27 of 40 He has a new area last week on the lateral aspect of his pressure ulcer more over the posterior trochanter. The original pressure ulcer looks quite stable has healthy granulation. 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 Almost fully epithelialized on the original superior area using Hydrofera Blue DTI on the left heel has peeled off this week to reveal a small superficial wound under denuded skin and subcutaneous tissue Both areas between the first and second toes look better including nothing open on the left 02/21/18; The patient's wounds on his left ischial tuberosity and posterior left greater trochanter actually looked better. He has a large area of irritation around the area which I think is contact dermatitis. I am doubtful that this is fungal His original wound on the left lateral calf continues to improve we have been using Hydrofera Blue There is no open area in the left first second toe web space although there is a lot of thick callus The DTI on the left heel required debridement today of necrotic surface eschar and subcutaneous tissue using silver alginate Finally the area on the right first second toe webspace continues to contract using silver alginate and ketoconazole 02/28/18 Left ischial tuberosity wounds look better using silver alginate. Original wound on the left calf only has one small open area left using Hydrofera Blue DTI on the left heel required debridement mostly removing skin from around this wound surface. Using silver alginate The areas on the right first/second toe web space using silver alginate and ketoconazole 03/08/18 on evaluation today patient appears to be doing decently well as  best I can tell in regard to his wounds. This is the first time that I have seen him as he generally is followed by Dr. Rufus. With that being said none of his wounds appear to be infected he does have an area where there is some skin covering what appears to be a new wound on the left dorsal surface of his great toe. This is right at the nail bed. With that being said I do believe that debrided away some of the excess skin can be of benefit in this regard. Otherwise he has been tolerating the dressing changes without complication. 03/14/18; patient arrives today with the multiplicity of wounds that we are following. He has not been systemically unwell Original wound on the left lateral calf now only has 2 small open areas we've been using Hydrofera Blue which should continue The deep tissue injury on the left heel requires debridement today. We've been using silver alginate The left first second toe and the right first second toe are both are reminiscence what I think was tinea pedis. Apparently some of the callus Surface between the toes was removed  last week when it started draining. Purulent drainage coming from the wound on the ischial tuberosity on the left. 03/21/18-He is here in follow-up evaluation for multiple wounds. 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 He tolerated debridement. The only change to last week's recommendations is to discontinue antifungal cream between toes. He will follow-up next week 03/28/18; following up for multiple wounds;Concern this week is streaking redness and swelling in the right foot. He is going to need antibiotics for this. 03/31/18; follow-up for right foot cellulitis. Streaking redness and swelling in the right foot on 03/28/18. 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 I put him on clindamycin  300 mg every 6 and brought him in for a quick check. He has an open  wound between his first and second toes on the right foot as a potential source. 04/04/18; Right foot cellulitis is resolving he is completing clindamycin . This is truly good news Left lateral calf wound which is initial wound only has one small open area inferiorly this is close to healing out. He has compression stockings. We will use Hydrofera Blue right down to the epithelialization of this Nonviable surface on the left heel which was initially pressure with a DTI. We've been using Hydrofera Blue. I'm going to switch this back to silver alginate Left first second toe/tinea pedis this looks better using silver alginate Right first second toe tinea pedis using silver alginate Large pressure ulcers on theLeft ischial tuberosity. Small wound here Looks better. I am uncertain about the surface over the large wound. Using silver alginate 04/11/18; Cellulitis in the right foot is resolved Left lateral calf wound which was his original wounds still has 2 tiny open areas remaining this is just about closed Nonviable surface on the left heel is better but still requires debridement Left first second toe/tinea pedis still open using silver alginate Right first second toe wound tinea pedis I asked him to go back to using ketoconazole and silver alginate Large pressure ulcers on the left ischial tuberosity this shear injury here is resolved. Wound is smaller. No evidence of infection using silver alginate 04/18/18; Patient arrives with an intense area of cellulitis in the right mid lower calf extending into the right heel area. Bright red and warm. Smaller area on the left anterior leg. He has a significant history of MRSA. He will definitely need antibioticsdoxycycline He now has 2 open areas on the left ischial tuberosity the original large wound and now a satellite area which I think was above his initial satellite areas. Not a wonderful surface on this satellite area surrounding erythema which looks  like pressure related. His left lateral calf wound again his original wound is just about closed Left heel pressure injury still requiring debridement Left first second toe looks a lot better using silver alginate Right first second toe also using silver alginate and ketoconazole cream also looks better 04/20/18; the patient was worked in early today out of concerns with his cellulitis on the right leg. I had started him on doxycycline . This was 2 days ago. His wife was concerned about the swelling in the area. Also concerned about the left buttock. He has not been systemically unwell no fever chills. No nausea vomiting or diarrhea 04/25/18; the patient's left buttock wound is continued to deteriorate he is using Hydrofera Blue. He is still completing clindamycin  for the cellulitis on the right leg although all of this looks better. 05/02/18 Left  buttock wound still with a lot of drainage and a very tightly adherent fibrinous necrotic surface. He has a deeper area superiorly The left lateral calf wound is still closed DTI wound on the left heel necrotic surface especially the circumference using Iodoflex Areas between his left first second toe and right first second toe both look better. Dorsally and the right first second toe he had a necrotic surface although at smaller. In using silver alginate and ketoconazole. I did a culture last week which was a deep tissue culture of the reminiscence of the open wound on the right first second toe dorsally. This grew a few Acinetobacter and a few methicillin-resistant staph aureus. Nevertheless the area actually this week looked better. I didn't feel the need to specifically address this at least in terms of systemic antibiotics. 05/09/18; wounds are measuring larger more drainage per our intake. We are using Santyl  covered with alginate on the large superficial buttock wounds, Iodosorb on the left heel, ketoconazole and silver alginate to the dorsal first and  second toes bilaterally. 05/16/18; The area on his left buttock better in some aspects although the area superiorly over the ischial tuberosity required an extensive debridement.using Santyl  Left heel appears stable. Using Iodoflex The areas between his first and second toes are not bad however there is spreading erythema up the dorsal aspect of his left foot this looks like cellulitis again. He is insensate the erythema is really very brilliant.o Erysipelas He went to see an allergist days ago because he was itching part of this he had lab work done. This showed a white count of 15.1 with 70% neutrophils. Hemoglobin of 11.4 and a platelet count of 659,000. Last white count we had in [facility] was a 2-1/2 years ago which was 25.9 but he was ill at the time. He was able to show me some lab work that was done by his primary physician the pattern is about the same. 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 But prompted me to go ahead and do x-rays of both feet and the pelvis rule out osteomyelitis. He also had a comprehensive metabolic panel this was reasonably normal his albumin was 3.7 liver function tests BUN/creatinine all normal 05/23/18; x-rays of both his feet from last week were negative for underlying pulmonary abnormality. The x-ray of his pelvis however showed mild irregularity in the left ischial which may represent some early osteomyelitis. The wound in the left ischial continues to get deeper clearly now exposed muscle. Each week necrotic surface material over this area. Whereas the rest of the wounds do not look so bad. Najarian, Anselmo E ([id number]) [phone].pdf Page 28 of 40 The left ischial wound we have been using Santyl  and calcium alginate T the left heel surface necrotic debris using Iodoflex o The left lateral leg is still healed Areas on the left dorsal foot and the right dorsal foot are about the same. There is some  inflammation on the left which might represent contact dermatitis, fungal dermatitis I am doubtful cellulitis although this looks better than last week 05/30/18; CT scan done at Hospital did not show any osteomyelitis or abscess. Suggested the possibility of underlying cellulitis although I don't see a lot of evidence of this at the bedside The wound itself on the left buttock/upper thigh actually looks somewhat better. No debridement Left heel also looks better no debridement continue Iodoflex Both dorsal first second toe spaces appear better using Lotrisone. Left  still required debridement 06/06/18; Intake reported some purulent looking drainage from the left gluteal wound. Using Santyl  and calcium alginate Left heel looks better although still a nonviable surface requiring debridement The left dorsal foot first/second webspace actually expanding and somewhat deeper. I may consider doing a shave biopsy of this area Right dorsal foot first/second webspace appears stable to improved. Using Lotrisone and silver alginate to both these areas 06/13/18 Left gluteal surface looks better. Now separated in the 2 wounds. No debridement required. Still drainage. We'll continue silver alginate Left heel continues to look better with Iodoflex continue this for at least another week Of his dorsal foot wounds the area on the left still has some depth although it looks better than last week. We've been using Lotrisone and silver alginate 06/20/18 Left gluteal continues to look better healthy tissue Left heel continues to look better healthy granulation wound is smaller. He is using Iodoflex and his long as this continues continue the Iodoflex Dorsal right foot looks better unfortunately dorsal left foot does not. There is swelling and erythema of his forefoot. He had minor trauma to this several days ago but doesn't think this was enough to have caused any tissue injury. Foot looks like cellulitis, we have had this  problem before 06/27/18 on evaluation today patient appears to be doing a little worse in regard to his foot ulcer. 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 Both of which would really be his only options for treating this infection. In the past he has been given and effusion of Orbactiv. This is done very well for him in the past again it's one time dosing IV antibiotic therapy. Subsequently I do believe this is something we're gonna need to see about doing at this point in time. Currently his other wounds seem to be doing somewhat better in my pinion I'm pretty happy in that regard. 07/03/18 on evaluation today patient's wounds actually appear to be doing fairly well. He has been tolerating the dressing changes without complication. All in all he seems to be showing signs of improvement. In regard to the antibiotics he has been dealing with infectious disease since I saw him last week as far as getting this scheduled. In the end [facility]'s [facility] this coming Friday. In the meantime he has been continuing to perform the dressing changes in such as previous. There does not appear to be any evidence of infection worsengin at this time. 07/10/18; Since I last saw this man 2 weeks ago things have actually improved. IV antibiotics of resulted in less forefoot erythema although there is still some present. He is not systemically unwell Left buttock wounds 2 now have no depth there is increased epithelialization Using silver alginate Left heel still requires debridement using Iodoflex Left dorsal foot still with a sizable wound about the size of a border but healthy granulation Right dorsal foot still with a slitlike area using silver alginate 07/18/18; the patient's cellulitis in the left foot is improved in fact  I think it is on its way to resolving. Left buttock wounds 2 both look better although the larger one has hypertension granulation we've been using silver alginate Left heel has some thick circumferential redundant skin over the wound edge which will need to be removed today we've been  using Iodoflex Left dorsal foot is still a sizable wound required debridement using silver alginate The right dorsal foot is just about closed only a small open area remains here 07/25/18; left foot cellulitis is resolved Left buttock wounds 2 both look better. Hyper-granulation on the major area Left heel as some debris over the surface but otherwise looks a healthier wound. Using silver collagen Right dorsal foot is just about closed 07/31/18; arrives with our intake nurse worried about purulent drainage from the buttock. We had hyper-granulation here last week His buttock wounds 2 continue to look better Left heel some debris over the surface but measuring smaller. Right dorsal foot unfortunately has openings between the toes Left foot superficial wound looks less aggravated. 08/07/18 Buttock wounds continue to look better although some of her granulation and the larger medial wound. silver alginate Left heel continues to look a lot better.silver collagen Left foot superficial wound looks less stable. Requires debridement. He has a new wound superficial area on the foot on the lateral dorsal foot. Right foot looks better using silver alginate without Lotrisone 08/14/2018; patient was in the ER last week diagnosed with a UTI. He is now on Cefpodoxime and Macrodantin . Buttock wounds continued to be smaller. Using silver alginate Left heel continues to look better using silver collagen Left foot superficial wound looks as though it is improving Right dorsal foot area is just about healed. 08/21/2018; patient is completed his antibiotics for his UTI. He has 2 open areas on the buttocks. There is still not closed  although the surface looks satisfactory. Using silver alginate Left heel continues to improve using silver collagen The bilateral dorsal foot areas which are at the base of his first and second toes/possible tinea pedis are actually stable on the left but worse on the right. The area on the left required debridement of necrotic surface. After debridement I obtained a specimen for PCR culture. The right dorsal foot which is been just about healed last week is now reopened 08/28/2018; culture done on the left dorsal foot showed coag negative staph both staph epidermidis and Lugdunensis. I think this is worthwhile initiating systemic treatment. I will use doxycycline  given his long list of allergies. The area on the left heel slightly improved but still requiring debridement. The large wound on the buttock is just about closed whereas the smaller one is larger. Using silver alginate in this area 09/04/2018; patient is completing his doxycycline  for the left foot although this continues to be a very difficult wound area with very adherent necrotic debris. We are using silver alginate to all his wounds right foot left foot and the small wounds on his buttock, silver collagen on the left heel. 09/11/2018; once again this patient has intense erythema and swelling of the left forefoot. Lesser degrees of erythema in the right foot. He has a long list of allergies and intolerances. I will reinstitute doxycycline . 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 Using silver alginate Left heel also looks better using silver collagen Unfortunately both the areas on his feet look worse. The area on the left first second webspace is now gone through to the plantar part of his foot. The area on the left foot anteriorly is irritated with erythema and swelling in the forefoot. 09/25/2018 His wound on the left plantar heel looks better. Using silver collagen The area on the  left buttock 2 small remnant areas. One is closed one is  still open. Using silver alginate The areas between both his first and second toes look worse. This in spite of long-standing antifungal therapy with ketoconazole and silver alginate which should have antifungal activity He has small areas around his original wound on the left calf one is on the bottom of the original scar tissue and one superiorly both of these are small and Longmore, Linnie E ([id number]) [phone].pdf Page 29 of 40 superficial but again given wound history in this site this is worrisome 10/02/2018 Left plantar heel continues to gradually contract using silver collagen Left buttock wound is unchanged using silver alginate The areas on his dorsal feet between his first and second toes bilaterally look about the same. I prescribed clindamycin  ointment to see if we can address chronic staph colonization and also the underlying possibility of erythrasma The left lateral lower extremity wound is actually on the lateral part of his ankle. Small open area here. We have been using silver alginate 10/09/2018; Left plantar heel continues to look healthy and contract. No debridement is required Left buttock slightly smaller with a tape injury wound just below which was new this week Dorsal feet somewhat improved I have been using clindamycin  Left lateral looks lower extremity the actual open area looks worse although a lot of this is epithelialized. I am going to change to silver collagen today He has a lot more swelling in the right leg although this is not pitting not red and not particularly warm there is a lot of spasm in the right leg usually indicative of people with paralysis of some underlying discomfort. We have reviewed his vascular status from 2017 he had a left greater saphenous vein ablation. I wonder about referring him back to vascular surgery if the area on the left leg continues to  deteriorate. 10/16/2018 in today for follow-up and management of multiple lower extremity ulcers. His left Buttock wound is much lower smaller and almost closed completely. The wound to the left ankle has began to reopen with Epithelialization and some adherent slough. He has multiple new areas to the left foot and leg. The left dorsal foot without much improvement. Wound present between left great webspace and 2nd toe. Erythema and edema present right leg. Right LE ultrasound obtained on 10/10/18 was negative for DVT . 10/23/2018; Left buttock is closed over. Still dry macerated skin but there is no open wound. I suspect this is chronic pressure/moisture Left lateral calf is quite a bit worse than when I saw this last. There is clearly drainage here he has macerated skin into the left plantar heel. We will change the primary dressing to alginate Left dorsal foot has some improvement in overall wound area. Still using clindamycin  and silver alginate Right dorsal foot about the same as the left using clindamycin  and silver alginate The erythema in the right leg has resolved. He is DVT rule out was negative Left heel pressure area required debridement although the wound is smaller and the surface is health 10/26/2018 The patient came back in for his nurse check today predominantly because of the drainage coming out of the left lateral leg with a recent reopening of his original wound on the left lateral calf. He comes in today with a large amount of surrounding erythema around the wound extending from the calf into the ankle and even in the area on the dorsal foot. He is not systemically unwell. He is not febrile. Nevertheless this looks like cellulitis. We have been using silver alginate to the  area. I changed him to a regular visit and I am going to prescribe him doxycycline . The rationale here is a long list of medication intolerances and a history of MRSA. I did not see anything that I thought  would provide a valuable culture 10/30/2018 Follow-up from his appointment 4 days ago with really an extensive area of cellulitis in the left calf left lateral ankle and left dorsal foot. I put him on doxycycline . He has a long list of medication allergies which are true allergy reactions. 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 In any case he is a lot better today. The erythema especially in the anterior and lateral left calf is better. He still has left ankle erythema. He also is complaining about increasing edema in the right leg we have only been using Kerlix Coban and he has been doing the wraps at home. Finally he has a spotty rash on the medial part of his upper left calf which looks like folliculitis or perhaps wrap occlusion type injury. Small superficial macules not pustules 11/06/18 patient arrives today with again a considerable degree of erythema around the wound on the left lateral calf extending into the dorsal ankle and dorsal foot. This is a lot worse than when I saw this last week. He is on doxycycline  really with not a lot of improvement. He has not been systemically unwell Wounds on the; left heel actually looks improved. Original area on the left foot and proximity to the first and second toes looks about the same. He has superficial areas on the dorsal foot, anterior calf and then the reopening of his original wound on the left lateral calf which looks about the same The only area he has on the right is the dorsal webspace first and second which is smaller. He has a large area of dry erythematous skin on the left buttock small open area here. 11/13/2018; the patient arrives in much better condition. The erythema around the wound on the left lateral calf is a lot better. Not sure whether this was the clindamycin  or the TCA and ketoconazole or just in the improvement in edema control [stasis dermatitis]. In any  case this is a lot better. The area on the left heel is very small and just about resolved using silver collagen we have been using silver alginate to the areas on his dorsal feet 11/20/2018; his wounds include the left lateral calf, left heel, dorsal aspects of both feet just proximal to the first second webspace. He is stable to slightly improved. I did not think any changes to his dressings were going to be necessary 11/27/2018 he has a reopening on the left buttock which is surrounded by what looks like tinea or perhaps some other form of dermatitis. The area on the left dorsal foot has some erythema around it I have marked this area but I am not sure whether this is cellulitis or not. Left heel is not closed. Left calf the reopening is really slightly longer and probably worse 1/13; in general things look better and smaller except for the left dorsal foot. Area on the left heel is just about closed, left buttock looks better only a small wound remains in the skin looks better [using Lotrisone] 1/20; the area on the left heel only has a few remaining open areas here. Left lateral calf about the same in terms of size, left dorsal foot slightly larger right lateral foot  still not closed. The area on the left buttock has no open wound and the surrounding skin looks a lot better 1/27; the area on the left heel is closed. Left lateral calf better but still requiring extensive debridements. The area on his left buttock is closed. He still has the open areas on the left dorsal foot which is slightly smaller in the right foot which is slightly expanded. We have been using Iodoflex on these areas as well 2/3; left heel is closed. Left lateral calf still requiring debridement using Iodoflex there is no open area on his left buttock however he has dry scaly skin over a large area of this. Not really responding well to the Lotrisone. Finally the areas on his dorsal feet at the level of the first second webspace  are slightly smaller on the right and about the same on the left. Both of these vigorously debrided with Anasept and gauze 2/10; left heel remains closed he has dry erythematous skin over the left buttock but there is no open wound here. Left lateral leg has come in and with. Still requiring debridement we have been using Iodoflex here. Finally the area on the left dorsal foot and right dorsal foot are really about the same extremely dry callused fissured areas. He does not yet have a dermatology appointment 2/17; left heel remains closed. He has a new open area on the left buttock. The area on the left lateral calf is bigger longer and still covered in necrotic debris. No major change in his foot areas bilaterally. I am awaiting for a dermatologist to look on this. We have been using ketoconazole I do not know that this is been doing any good at all. 2/24; left heel remains closed. The left buttock wound that was new reopening last week looks better. The left lateral calf appears better also although still requires debridement. The major area on his foot is the left first second also requiring debridement. We have been putting Prisma on all wounds. I do not believe that the ketoconazole has done too much good for his feet. He will use Lotrisone I am going to give him a 2-week course of terbinafine. We still do not have a dermatology appointment 3/2 left heel remains closed however there is skin over bone in this area I pointed this out to him today. The left buttock wound is epithelialized but still does not look completely stable. The area on the left leg required debridement were using silver collagen here. With regards to his feet we changed to Lotrisone last week and silver alginate. 3/9; left heel remains closed. Left buttock remains closed. The area on the right foot is essentially closed. The left foot remains unchanged. Slightly smaller on the left lateral calf. Using silver collagen to both  of these areas 3/16-Left heel remains closed. Area on right foot is closed. Left lateral calf above the lateral malleolus open wound requiring debridement with easy bleeding. Left dorsal wound proximal to first toe also debrided. Left ischial area open new. Patient has been using Prisma with wrapping every 3 days. Dermatology appointment is apparently tomorrow.Patient has completed his terbinafine 2-week course with some apparent improvement according to him, there is still flaking and dry skin in his foot on the left 3/23; area on the right foot is reopened. The area on the left anterior foot is about the same still a very necrotic adherent surface. He still has the area on the left leg and reopening is on the  left buttock. He apparently saw dermatology although I do not have a note. According to the patient who is usually fairly well informed they did not have any good ideas. Intriago, Isaid E ([id number]) [phone].pdf Page 30 of 40 Put him on oral terbinafine which she is been on before. 3/30; using silver collagen to all wounds. Apparently his dermatologist put him on doxycycline  and rifampin presumably some culture grew staph. I do not have this result. He remains on terbinafine although I have used terbinafine on him before 4/6; patient has had a fairly substantial reopening on the right foot between the first and second toes. He is finished his terbinafine and I believe is on doxycycline  and rifampin still as prescribed by dermatology. We have been using silver collagen to all his wounds although the patient reports that he thinks silver alginate does better on the wounds on his buttock. 4/13; the area on his left lateral calf about the same size but it did not require debridement. Left dorsal foot just proximal to the webspace between the first and second toes is about the same. Still nonviable surface. I note some superficial bronze discoloration of the dorsal part of  his foot Right dorsal foot just proximal to the first and second toes also looks about the same. I still think there may be the same discoloration I noted above on the left Left buttock wound looks about the same 4/20; left lateral calf appears to be gradually contracting using silver collagen. 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 The left dorsal foot wound is debrided of tightly adherent necrotic debris and really cleans up quite nicely. The right area is worse with expansion. I did not debride this it is now over the base of the second toe The area on his left buttock is smaller no debridement is required using silver collagen 5/4; left calf continues to make good progress. He arrives with erythema around the wounds on his dorsal foot which even extends to the plantar aspect. Very concerning for coexistent infection. He is finished the erythromycin I gave him for possible erythrasma this does not seem to have helped. The area on the left foot is about the same base of the dorsal toes Is area on the buttock looks improved on the left 5/11; left calf and left buttock continued to make good progress. Left foot is about the same to slightly improved. Major problem is on the right foot. He has not had an x-ray. Deep tissue culture I did last week showed both Enterobacter and E. coli. I did not change the doxycycline  I put him on empirically although neither 1 of these were plated to doxycycline . He arrives today with the erythema looking worse on both the dorsal and plantar foot. Macerated skin on the bottom of the foot. he has not been systemically unwell 5/18-Patient returns at 1 week, left calf wound appears to be making some progress, left buttock wound appears slightly worse than last time, left foot wound looks slightly better, right foot redness is marginally better. X-ray of both feet show no air or evidence of osteomyelitis.  Patient is finished his Omnicef  and terbinafine. He continues to have macerated skin on the bottom of the left foot as well as right 5/26; left calf wound is better, left buttock wound appears to have multiple small superficial open areas with surrounding macerated skin. X-rays that I did last time showed no evidence of osteomyelitis in either foot. He is  finished cefdinir  and doxycycline . I do not think that he was on terbinafine. He continues to have a large superficial open area on the right foot anterior dorsal and slightly between the first and second toes. I did send him to dermatology 2 months ago or so wondering about whether they would do a fungal scraping. I do not believe they did but did do a culture. We have been using silver alginate to the toe areas, he has been using antifungals at home topically either ketoconazole or Lotrisone. We are using silver collagen on the left foot, silver alginate on the right, silver collagen on the left lateral leg and silver alginate on the left buttock 6/1; left buttock area is healed. We have the left dorsal foot, left lateral leg and right dorsal foot. We are using silver alginate to the areas on both feet and silver collagen to the area on his left lateral calf 6/8; the left buttock apparently reopened late last week. He is not really sure how this happened. He is tolerating the terbinafine. Using silver alginate to all wounds 6/15; left buttock wound is larger than last week but still superficial. Came in the clinic today with a report of purulence from the left lateral leg I did not identify any infection Both areas on his dorsal feet appear to be better. He is tolerating the terbinafine. Using silver alginate to all wounds 6/22; left buttock is about the same this week, left calf quite a bit better. His left foot is about the same however he comes in with erythema and warmth in the right forefoot once again. Culture that I gave him in the  beginning of May showed Enterobacter and E. coli. I gave him doxycycline  and things seem to improve although neither 1 of these organisms was specifically plated. 6/29; left buttock is larger and dry this week. Left lateral calf looks to me to be improved. Left dorsal foot also somewhat improved right foot completely unchanged. The erythema on the right foot is still present. He is completing the Ceftin dinner that I gave him empirically [see discussion above.) 7/6 - All wounds look to be stable and perhaps improved, the left buttock wound is slightly smaller, per patient bleeds easily, completed ceftin, the right foot redness is less, he is on terbinafine 7/13; left buttock wound about the same perhaps slightly narrower. Area on the left lateral leg continues to narrow. Left dorsal foot slightly smaller right foot about the same. We are using silver alginate on the right foot and Hydrofera Blue to the areas on the left. Unna boot on the left 2 layer compression on the right 7/20; left buttock wound absolutely the same. Area on lateral leg continues to get better. Left dorsal foot require debridement as did the right no major change in the 7/27; left buttock wound the same size necrotic debris over the surface. The area on the lateral leg is closed once again. His left foot looks better right foot about the same although there is some involvement now of the posterior first second toe area. He is still on terbinafine which I have given him for a month, not certain a centimeter major change 06/25/19-All wounds appear to be slightly improved according to report, left buttock wound looks clean, both foot wounds have minimal to no debris the right dorsal foot has minimal slough. We are using Hydrofera Blue to the left and silver alginate to the right foot and ischial wound. 8/10-Wounds all appear to be around  the same, the right forefoot distal part has some redness which was not there before, however the  wound looks clean and small. Ischial wound looks about the same with no changes 8/17; his wound on the left lateral calf which was his original chronic venous insufficiency wound remains closed. Since I last saw him the areas on the left dorsal foot right dorsal foot generally appear better but require debridement. The area on his left initial tuberosity appears somewhat larger to me perhaps hyper granulated and bleeds very easily. We have been using Hydrofera Blue to the left dorsal foot and silver alginate to everything else 8/24; left lateral calf remains closed. The areas on his dorsal feet on the webspace of the first and second toes bilaterally both look better. The area on the left buttock which is the pressure ulcer stage II slightly smaller. I change the dressing to Hydrofera Blue to all areas 8/31; left lateral calf remains closed. The area on his dorsal feet bilaterally look better. Using Hydrofera Blue. Still requiring debridement on the left foot. No change in the left buttock pressure ulcers however 9/14; left lateral calf remains closed. Dorsal feet look quite a bit better than 2 weeks ago. Flaking dry skin also a lot better with the ammonium lactate I gave him 2 weeks ago. The area on the left buttock is improved. He states that his Roho cushion developed a leak and he is getting a new one, in the interim he is offloading this vigorously 9/21; left calf remains closed. Left heel which was a possible DTI looks better this week. He had macerated tissue around the left dorsal foot right foot looks satisfactory and improved left buttock wound. I changed his dressings to his feet to silver alginate bilaterally. Continuing Hydrofera Blue on the left buttock. 9/28 left calf remains closed. Left heel did not develop anything [possible DTI] dry flaking skin on the left dorsal foot. Right foot looks satisfactory. Improved left buttock wound. We are using silver alginate on his feet Hydrofera  Blue on the buttock. I have asked him to go back to the Lotrisone on his feet including the wounds and surrounding areas 10/5; left calf remains closed. The areas on the left and right feet about the same. A lot of this is epithelialized however debris over the remaining open areas. He is using Lotrisone and silver alginate. The area on the left buttock using Hydrofera Blue 10/26. Patient has been out for 3 weeks secondary to Covid concerns. He tested negative but I think his wife tested positive. He comes in today with the left foot substantially worse, right foot about the same. Even more concerning he states that the area on his left buttock closed over but then reopened and is considerably deeper in one aspect than it was before [stage III wound] 11/2; left foot really about the same as last week. Quarter sized wound on the dorsal foot just proximal to the first second toes. Surrounding erythema with areas of denuded epithelium. This is not really much different looking. Did not look like cellulitis this time however. Right foot area about the same.. We have been using silver alginate alginate on his toes Left buttock still substantial irritated skin around the wound which I think looks somewhat better. We have been using Hydrofera Blue here. 11/9; left foot larger than last week and a very necrotic surface. Right foot I think is about the same perhaps slightly smaller. Debris around the circumference also addressed. Unfortunately on the  left buttock there is been a decline. Satellite lesions below the major wound distally and now a an additional one posteriorly we have been using Hydrofera Blue but I think this is a pressure issue 11/16; left foot ulcer dorsally again a very adherent necrotic surface. Right foot is about the same. Not much change in the pressure ulcer on his left buttock. Bayard, [place] E ([id number]) [phone].pdf Page 31 of 40 11/30; left foot ulcer  dorsally basically the same as when I saw him 2 weeks ago. Very adherent fibrinous debris on the wound surface. Patient reports a lot of drainage as well. 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 We have been using Iodoflex, patient changed back to alginate because of the drainage. Area on his right dorsal foot really looks benign with a healthier surface certainly a lot better than on the left. Left buttock wounds all improved using Hydrofera Blue 12/7; left dorsal foot again no improvement. Tightly adherent debris. PCR culture I did last week only showed likely skin contaminant. I have gone ahead and done a punch biopsy of this which is about the last thing in terms of investigations I can think to do. He has known venous insufficiency and venous hypertension and this could be the issue here. The area on the right foot is about the same left buttock slightly worse according to our intake nurse secondary to [facility] Blue sticking to the wound 12/14; biopsy of the left foot that I did last time showed changes that could be related to wound healing/chronic stasis dermatitis phenomenon no neoplasm. We have been using silver alginate to both feet. I change the one on the left today to Sorbact and silver alginate to his other 2 wounds 12/28; the patient arrives with the following problems; Major issue is the dorsal left foot which continues to be a larger deeper wound area. Still with a completely nonviable surface Paradoxically the area mirror image on the right on the right dorsal foot appears to be getting better. He had some loss of dry denuded skin from the lower part of his original wound on the left lateral calf. Some of this area looked a little vulnerable and for this reason we put him in wrap that on this side this week The area on his left buttock is larger. He still has the erythematous circular area which I think is a combination of  pressure, sweat. This does not look like cellulitis or fungal dermatitis 11/26/2019; -Dorsal left foot large open wound with depth. Still debris over the surface. Using Sorbact The area on the dorsal right foot paradoxically has closed over He has a reopening on the left ankle laterally at the base of his original wound that extended up into the calf. This appears clean. The left buttock wound is smaller but with very adherent necrotic debris over the surface. We have been using silver alginate here as well The patient had arterial studies done in 2017. He had biphasic waveforms at the dorsalis pedis and posterior tibial bilaterally. ABI in the left was 1.17. Digit waveforms were dampened. He has slight spasticity in the great toes I do not think a TBI would be possible 1/11; the patient comes in today with a sizable reopening between the first and second toes on the right. This is not exactly in the same location where we have been treating wounds previously. According to our intake nurse this was actually fairly deep but 0.6 cm. The  area on the left dorsal foot looks about the same the surface is somewhat cleaner using Sorbact, his MRI is in 2 days. We have not managed yet to get arterial studies. The new reopening on the left lateral calf looks somewhat better using alginate. The left buttock wound is about the same using alginate 1/18; the patient had his ARTERIAL studies which were quite normal. ABI in the right at 1.13 with triphasic/biphasic waveforms on the left ABI 1.06 again with triphasic/biphasic waveforms. It would not have been possible to have done a toe brachial index because of spasticity. We have been using Sorbac to the left foot alginate to the rest of his wounds on the right foot left lateral calf and left buttock 1/25; arrives in clinic with erythema and swelling of the left forefoot worse over the first MTP area. This extends laterally dorsally and but also posteriorly. Still  has an area on the left lateral part of the lower part of his calf wound it is eschared and clearly not closed. Area on the left buttock still with surrounding irritation and erythema. Right foot surface wound dorsally. The area between the right and first and second toes appears better. 2/1; The left foot wound is about the same. Erythema slightly better I gave him a week of doxycycline  empirically Right foot wound is more extensive extending between the toes to the plantar surface Left lateral calf really no open surface on the inferior part of his original wound however the entire area still looks vulnerable Absolutely no improvement in the left buttock wound required debridement. 2/8; the left foot is about the same. Erythema is slightly improved I gave him clindamycin  last week. Right foot looks better he is using Lotrimin and silver alginate He has a breakdown in the left lateral calf. Denuded epithelium which I have removed Left buttock about the same were using Hydrofera Blue 2/15; left foot is about the same there is less surrounding erythema. Surface still has tightly adherent debris which I have debriding however not making any progress Right foot has a substantial wound on the medial right second toe between the first and second webspace. Still an open area on the left lateral calf distal area. Buttock wound is about the same 2/22; left foot is about the same less surrounding erythema. Surface has adherent debris. Polymen Ag Right foot area significant wound between the first and second toes. We have been using silver alginate here Left lateral leg polymen Ag at the base of his original venous insufficiency wound Left buttock some improvement here 3/1; Right foot is deteriorating in the first second toe webspace. Larger and more substantial. We have been using silver alginate. Left dorsal foot about the same markedly adherent surface debris using PolyMem Ag Left lateral calf  surface debris using PolyMem AG Left buttock is improved again using PolyMem Ag. He is completing his terbinafine. The erythema in the foot seems better. He has been on this for 2 weeks 3/8; no improvement in any wound area in fact he has a small open area on the dorsal midfoot which is new this week. He has not gotten his foot x-rays yet 3/15; his x-rays were both negative for osteomyelitis of both feet. No major change in any of his wounds on the extremities however his buttock wounds are better. We have been using polymen on the buttocks, left lower leg. Iodoflex on the left foot and silver alginate on the right 3/22; arrives in clinic today with the 2  major issues are the improvement in the left dorsal foot wound which for once actually looks healthy with a nice healthy wound surface without debridement. Using Iodoflex here. Unfortunately on the left lateral calf which is in the distal part of his original wound he came to the clinic here for there was purulent drainage noted some increased breakdown scattered around the original area and a small area proximally. We we are using polymen here will change to silver alginate today. His buttock wound on the left is better and I think the area on the right first second toe webspace is also improved 3/29; left dorsal foot looks better. Using Iodoflex. Left ankle culture from deterioration last time grew E. coli, Enterobacter and Enterococcus. I will give him a course of cefdinir  although that will not cover Enterococcus. The area on the right foot in the webspace of the first and second toe lateral first toe looks better. The area on his buttock is about healed Vascular appointment is on April 21. This is to look at his venous system vis--vis continued breakdown of the wounds on the left including the left lateral leg and left dorsal foot he. He has had previous ablations on this side 4/5; the area between the right first and second toes lateral aspect  of the first toe looks better. Dorsal aspect of the left first toe on the left foot also improved. Unfortunately the left lateral lower leg is larger and there is a second satellite wound superiorly. The usual superficial abrasions on the left buttock overall better but certainly not closed 4/12; the area between the right first and second toes is improved. Dorsal aspect of the left foot also slightly smaller with a vibrant healthy looking surface. No real change in the left lateral leg and the left buttock wound is healed He has an unaffordable co-pay for Apligraf. Appointment with vein and vascular with regards to the left leg venous part of the circulation is on 4/21 4/19; we continue to see improvement in all wound areas. Although this is minor. He has his vascular appointment on 4/21. The area on the left buttock has not reopened although right in the center of this area the skin looks somewhat threatened 4/26; the left buttock is unfortunately reopened. In general his left dorsal foot has a healthy surface and looks somewhat smaller although it was not measured as such. The area between his first and second toe webspace on the right as a small wound against the first toe. The patient saw vascular surgery. The real question I was asking was about the small saphenous vein on the left. He has previously ablated left greater saphenous vein. Nothing further was commented on on the left. Right greater saphenous vein without reflux at the saphenofemoral junction or proximal thigh Gimbel, Raijon E ([id number]) [id number] Page 32 of 40 there was no indication for ablation of the right greater saphenous vein duplex was negative for DVT bilaterally. They did not think there was anything from a vascular surgery point of view that could be offered. They ABIs within normal limits 5/3; only small open area on the left buttock. The area on the left lateral leg which was his original  venous reflux is now 2 wounds both which look clean. We are using Iodoflex on the left dorsal foot which looks healthy and smaller. He is down to a very tiny area between the right first and second toes, using silver alginate 5/10; all of his wounds appear better. We have  much better edema control in 4 layer compression on the left. This may be the factor that is allowing the left foot and left lateral calf to heal. He has external compression garments at home 04/14/20-All of his wounds are progressing well, the left forefoot is practically closed, left ischium appears to be about the same, right toe webspace is also smaller. The left lateral leg is about the same, continue using Hydrofera Blue to this, silver alginate to the ischium, Iodoflex to the toe space on the right 6/7; most of his wounds outside of the left buttock are doing well. The area on the left lateral calf and left dorsal foot are smaller. The area on the right foot in between the first and second toe webspace is barely visible although he still says there is some drainage here is the only reason I did not heal this out. Unfortunately the area on the left buttock almost looks like he has a skin tear from tape. He has open wound and then a large flap of skin that we are trying to get adherence over an area just next to the remaining wound 6/21; 2 week follow-up. I believe is been here for nurse visits. Miraculously the area between his first and second toes on the left dorsal foot is closed over. Still open on the right first second web space. The left lateral calf has 2 open areas. Distally this is more superficial. The proximal area had a little more depth and required debridement of adherent necrotic material. His buttock wound is actually larger we have been using silver alginate here 6/28; the patient's area on the left foot remains closed. Still open wet area between the first and second toes on the right and also extending into  the plantar aspect. We have been using silver alginate in this location. He has 2 areas on the left lower leg part of his original long wounds which I think are better. We have been using Hydrofera Blue here. Hydrofera Blue to the left buttock which is stable 7/12; left foot remains closed. Left ankle is closed. May be a small area between his right first and second toes the only truly open area is on the left buttock. We have been using Hydrofera Blue here 7/19; patient arrives with marked deterioration especially in the left foot and ankle. We did not put him in a compression wrap on the left last week in fact he wore his juxta lite stockings on either side although he does not have an underlying stocking. He has a reopening on the left dorsal foot, left lateral ankle and a new area on the right dorsal ankle. More worrisome is the degree of erythema on the left foot extending on the lateral foot into the lateral lower leg on the left 7/26; the patient had erythema and drainage from the lateral left ankle last week. Culture of this grew MRSA resistant to doxycycline  and clindamycin  which are the 2 antibiotics we usually use with this patient who has multiple antibiotic allergies including linezolid, trimethoprim sulfamethoxazole. I had give him an empiric doxycycline  and he comes in the area certainly looks somewhat better although it is blotchy in his lower leg. He has not been systemically unwell. He has had areas on the left dorsal foot which is a reopening, chronic wounds on the left lateral ankle. Both of these I think are secondary to chronic venous insufficiency. The area between his first and second toes is closed as far as I can  tell. He had a new wrap injury on the right dorsal ankle last week. Finally he has an area on the left buttock. We have been using silver alginate to everything except the left buttock we are using Hydrofera Blue 06/30/20-Patient returns at 1 week, has been given a  sample dose pack of NUZYRA  which is a tetracycline derivative [omadacycline ], patient has completed those, we have been using silver alginate to almost all the wounds except the left ischium where we are using Hydrofera Blue all of them look better 8/16; since I last saw the patient he has been doing well. The area on the left buttock, left lateral ankle and left foot are all closed today. He has completed the Nuzyra  I gave him last time and tolerated this well. He still has open areas on the right dorsal ankle and in the right first second toe area which we are using silver alginate. 8/23; we put him in his bilateral external compression stockings last week as he did not have anything open on either leg except for concerning area between the right first and second toe. He comes in today with an area on the left dorsal foot slightly more proximal than the original wound, the left lateral foot but this is actually a continuation of the area he had on the left lateral ankle from last time. As well he is opened up on the left buttock again. 8/30; comes in today with things looking a lot better. The area on the left lower ankle has closed down as has the left foot but with eschar in both areas. The area on the dorsal right ankle is also epithelialized. Very little remaining of the left buttock wound. We have been using silver alginate on all wound areas 9/13; the area in the first second toe webspace on the right has fully epithelialized. He still has some vulnerable epithelium on the right and the ankle and the dorsal foot. He notes weeping. He is using his juxta lite stocking. On the left again the left dorsal foot is closed left lateral ankle is closed. We went to the juxta lite stocking here as well. Still vulnerable in the left buttock although only 2 small open areas remain here 9/27; 2-week follow-up. We did not look at his left leg but the patient says everything is closed. He is a bit disturbed by  the amount of edema in his left foot he is using juxta lite stockings but asking about over the toes stockings which would be 30/40, will talk to him next time. According to him there is no open wound on either the left foot or the left ankle/calf He has an open area on the dorsal right calf which I initially point a wrap injury. He has superficial remaining wound on the left ischial tuberosity been using silver alginate although he says this sticks to the wound 10/5; we gave him 2-week follow-up but he called yesterday expressing some concerns about his right foot right ankle and the left buttock. He came in early. There is still no open areas on the left leg and that still in his juxta lite stocking 10/11; he only has 1 small area on the left buttock that remains measuring millimeters 1 mm. Still has the same irritated skin in this area. We recommended zinc oxide when this eventually closes and pressure relief is meticulously is he can do this. He still has an area on the dorsal part of his right first through third toes which  is a bit irritated and still open and on the dorsal ankle near the crease of the ankle. We have been using silver alginate and using his own stocking. He has nothing open on the left leg or foot 10/25; 2-week follow-up. Not nearly as good on the left buttock as I was hoping. For open areas with 5 looking threatened small. He has the erythematous irritated chronic skin in this area. 1 area on the right dorsal ankle. He reports this area bleeds easily Right dorsal foot just proximal to the base of his toes We have been using silver alginate. 11/8; 2-week follow-up. Left buttock is about the same although I do not think the wounds are in the same location we have been using silver alginate. I have asked him to use zinc oxide on the skin around the wounds. He still has a small area on the right dorsal ankle he reports this bleeds easily Right dorsal foot just proximal to the  base of the toes does not have anything open although the skin is very dry and scaly He has a new opening on the nailbed of the left great toe. Nothing on the left ankle 11/29; 3-week follow-up. Left buttock has 2 open areas. And washing of these wounds today started bleeding easily. Suggesting very friable tissue. We have been using silver alginate. Right dorsal ankle which I thought was initially a wrap injury we have been using silver alginate. Nothing open between the toes that I can see. He states the area on the left dorsal toe nailbed healed after the last visit in 2 or 3 days 12/13; 3-week follow-up. His left buttock now has 3 open areas but the original 2 areas are smaller using polymen here. Surrounding skin looks better. The right dorsal ankle is closed. He has a small opening on the right dorsal foot at the level of the third toe. In general the skin looks better here. He is wearing his juxta lite stocking on the left leg says there is nothing open 11/24/2020; 3 weeks follow-up. His left buttock still has the 3 open areas. We have been using polymen but due to lack of response he changed to Hydrofera Blue area. Surrounding skin is dry erythematous and irritated looking. There is no evidence of infection either bacterial or fungal however there is loss of surface epithelium He still has very dry skin in his foot causing irritation and erythema on the dorsal part of his toes. This is not responded to prolonged courses of antifungal simply looks dry and irritated 1/24; left buttock area still looks about the same he was unable to find the triad ointment that we had suggested. The area on the right lower leg just above the dorsal ankle has reopened and the areas on the right foot between the first second and second third toes and scaling on the bottom of the foot has been about the same for quite some time now. been using silver alginate to all wound areas 2/7; left buttock wound looked quite  good although not much smaller in terms of surface area surrounding skin looks better. Only a few dry flaking areas on the right foot in between the first and second toes the skin generally looks better here [ammonium lactate]. Finally the area on the right dorsal ankle is closed 2/21; There is no open area on the right foot even between the right first and second toe. Skin around this area dorsally and plantar aspects look better. He has a reopening  of the area on the right ankle just above the crease of the ankle dorsally. I continue to think that this is probably friction from spasms may be even this time with his stocking under the compression stockings. Wounds on his left buttock look about the same there a couple of areas that have reopened. He has a total square area of loss of epithelialization. This does not look like infection it looks like a contact dermatitis but I just cannot determine to what 3/14; there is nothing on the right foot between the first and second toes this was carefully inspected under illumination. Some chronic irritation on the dorsal Kerstein, Elohim E ([id number]) [phone].pdf Page 33 of 40 part of his foot from toes 1-3 at the base. Nothing really open here substantially. Still has an area on the right foot/ankle that is actually larger and hyper granulated. His buttock area on the left is just about closed however he has chronic inflammation with loss of the surface epithelial layer 3/28; 2-week follow-up. In clinic today with a new wound on the left anterior mid tibia. Says this happened about 2 weeks ago. He is not really sure how wonders about the spasticity of his legs at night whether that could have caused this other than that he does not have a good idea. He has been using topical antibiotics and silver alginate. The area on his right dorsal ankle seems somewhat better. Finally everything on his left buttock is closed. 4/11; 2-week  follow-up. All of his wounds are better except for the area over the ischium and left buttock which have opened up widely again. At least part of this is covered in necrotic fibrinous material another part had rolled nonviable skin. The area on the right ankle, left anterior mid tibia are both a lot better. He had no open wounds on either foot including the areas between the first and second toes 4/25; patient presents for 2-week follow-up. He states that the wounds are overall stable. He has no complaints today and states he is using Hydrofera Blue to open wounds. 5/9; have not seen this man in over a month. For my memory he has open areas on the left mid tibia and right ankle. T oday he has new open area on the right dorsal foot which we have not had a problem with recently. He has the sustained area on the left buttock He is also changed his insurance at the beginning of the year [employer]. We will need prior authorizations for debridement 5/23; patient presents for 2-week follow-up. He has prior authorizations for debridement. He denies any issues in the past 2 weeks with his wound care. He has been using Hydrofera Blue to all the wounds. He does report a circular rash to the upper left leg that is new. He denies acute signs of infection. 6/6; 2-week follow-up. The patient has open wounds on the left buttock which are worse than the last time I saw this about a month ago. He also has a new area to me on the left anterior mid tibia with some surrounding erythema. The area on the dorsal ankle on the right is closed but I think this will be a friction injury every time this area is exposed to either our wraps or his compression stockings caused by unrelenting spasms in this leg. 6/20; 2-week follow-up.  The patient has open wounds on the left buttock which is about the same. Using Hydrofera Blue here. - The left mid tibia has  a static amount of surrounding erythema. Also a raised area in the  center. We have been using Hydrofera Blue here. Finally he has broken down in his dorsal right foot extending between the first and second toes and going to the base of the first and second toe webspace. I have previously assumed that this was severe venous hypertension 7/5; 2-week follow-up  The left buttock wound actually looks better. We are using Hydrofera Blue. He has extensive skin irritation around this area and I have not really been able to get that any better. I have tried Lotrisone i.e. antifungals and steroids. More most recently we have just been using Coloplast really looks about the same.  The left mid tibia which was new last week culture to have very resistant staph aureus. Not only methicillin-resistant but doxycycline  resistant. The patient has a plethora of antibiotic allergies including sulfa, linezolid. I used topical bacitracin  on this but he has not started this yet.  In addition he has an expanding area of erythema with a wound on the dorsal right foot. I did a deep tissue culture of this area today 7/12;  Left buttock area actually looks better surrounding skin also looks less irritated.  Left mid tibia looks about the same. He is using bacitracin  this is not worse  Right dorsal foot looks about the same as well.  The left first toe also looks about the same 7/19; left buttock wound continues to improve in terms of open areas  Left mid tibia is still concerning amount of swelling he is using bacitracin   Dorsal left first toe somewhat smaller  Right dorsal foot somewhat smaller 7/25; left buttock wound actually continues to improve  Left mid tibia area has less swelling. I gave him all my samples of new Nuzyra . This seems to have helped although the wound is still open it. His abrasion closed by here  Left dorsal great toe really no better. Still a very nonviable surface  Right dorsal foot perhaps some better.  We have been using bacitracin  and silver nitrate to the  areas on his lower legs and Hydrofera Blue to the area on the buttock. 8/16  Disappointed that his left buttock wound is actually more substantial. Apparently during the last nurse visit these were both very small. He has continued irritation to a large area of skin on his buttock. I have never been able to totally explain this although I think it some combination of the way he sits, pressure, moisture. He is not incontinent enough to contribute to this.  Left dorsal great toe still fibrinous debris on the surface that I have debrided today  Large area across the dorsal right toes.  The area on the left anterior mid tibia has less swelling. He completed the Nuzyra . This does not look infected although the tissue is still fried 8/30; 2-week follow-up.  Left buttock areas not improved. We used Hydrofera Blue on this. Weeping wet with the surrounding erythema that I have not been able to control even with Lotrisone and topical Coloplast  Left dorsal great toe looks about the same  More substantial area again at the base of his toes on the left which is new this week.  Area across the dorsal right toes looks improved  The left anterior mid tibia looks like it is trying to close 9/13; 2-week follow-up. Using silver alginate on all of his wounds. The left dorsal foot does not look any better. He has the area on the dorsal  toe and also the areas at the base of all of his toes 1 through 3. On the right foot he has a similar pattern in a similar area. He has the area on his left mid tibia that looks fairly healthy. Finally the area on his left buttock looks somewhat bette 9/20; culture I did of the left foot which was a deep tissue culture last time showed E. coli he has erythema around this wound. Still a completely necrotic surface. His right dorsal foot looks about the same. He has a very friable surface to the left anterior mid tibia. Both buttock wounds look better. We have been using silver alginate  to all wounds 10/4; he has completed the cephalexin  that I gave him last time for the left foot. He is using topical gentamicin under silver alginate silver alginate being applied to all the wounds. Unfortunately all the wounds look irritated on his dorsal right foot dorsal left foot left mid tibia. I wonder if this could be a silver allergy. I am going to change him to Hydrofera Blue on the lower extremity. The skin on the left buttock and left posterior thigh still flaking dry and irritated. This has continued no matter what I have applied topically to this. He has a solitary open wound which by itself does not look too bad however the entire area of surrounding skin does not change no matter what we have applied here 10/18; the area on the left dorsal foot and right dorsal foot both look better. The area on the right extends into the plantar but not between his toes. We have been using silver alginate. He still has a rectangular erythematous area around the area on the left tibia. The wound itself is very small. Finally everything on his left buttock looks a little larger the skin is erythematous 11/15; patient comes in with the left dorsal and right dorsal foot distally looking somewhat better. Still nonviable surface on the left foot which required debridement. He still has the area on the left anterior mid tibia although this looks somewhat better. He has a new area on the right lateral lower leg just above the ankle. Finally his left buttock looks terrible with multiple superficial open wounds geometric square shaped area of chronic erythema which I have not been able to sort through 11/29; right dorsal foot and left dorsal foot both look somewhat better. No debridement required. He has the fragile area on the left anterior mid tibia this looks and continues to look somewhat better. Right lateral lower leg just above the ankle we identified last time also looks better. In general the area on his  left buttock looks improved. We are using Hydrofera Blue to all wound areas 12/13; right dorsal foot looks better. The area on the right lateral leg is healed. Left dorsal foot has 2 open areas both of which require debridement. The fragile area on the left anterior mid tibial looks better. Smaller area on his buttocks. Were using Hydrofera Blue 1/10; patient comes in with everything looking slightly larger and/or worse. This includes his left buttock, reopening of the left mid tibia, larger areas on the left dorsal foot and what looks to be a cellulitis on the right dorsal and plantar foot. We have been using Hydrofera Blue on all wounds. 1/17; right dorsal foot distally looks better today. The left foot has 2 open wounds that are about the same surrounding erythema. Culture I did last week [place], Eliab E ([id number]) [phone].pdf Page  34 of 40 showed rare Enterococcus and a multidrug resistant MRSA. 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 No malignancy they did not stain for fungus 1/24; his right distal foot is not closed dry and scaly but the wound looks like it is contracting. I did not debride anything here. Problem on his left dorsal foot with expanding erythema. Apparently there were problems last week getting the Nuzyra  however it is now available at the [facility] but a week later. He is using ketoconazole and Coloplast to the left buttock along with Hydrofera Blue this actually looks quite a bit better today. 1/31; right dorsal foot again is dry and scaly but looking to contract. He has been using a moisturizer on his feet at my request but he is not sure which 1. The left dorsal foot wounds look about the same there is erythema here that I marked last week however after course of Nuzyra  it certainly is not any better but not any worse either. Finally on his left buttock the skin continues to look  better he has the original wound but a new substantial area towards the gluteal cleft. Almost like a skin tear. I used scissors to remove skin and subcutaneous tissue here silver nitrate and direct pressure 2/7; right dorsal foot. This does not look too much different from last week. Some erythema skin dry and scaly. No debridement. Left dorsal great toe again still not much improvement. I did remove flaking dry skin and callus from around the edge. Finally on his left buttock. The skin is somewhat better in the periwound. Surface wounds are superficial somewhat better than last week. 01/26/2022: Is a little bit of a mystery as to why his wounds fail to respond to treatments and actually seem to get worse. This is my first encounter with this patient. He was previously followed by Dr. Rufus. Based upon my review of the chart, it seems that there is a little bit of a mystery as to why his wounds do not respond as anticipated to the interventions applied and sometimes even get worse. Biopsies have been performed and he was seen by dermatology in [place], but that did not shed any light on the matter. T oday, his gluteal wound is larger, with substantial drainage, rather malodorous. The food wounds are not terrible, but he has a lot of callus and scaly skin around these. He is currently getting silver alginate on the gluteal wound, with idodoflex to the feet. He is using lotrisone on his legs for the dry, scaly skin. 02/09/2022: There has really been no change to any of his wounds. The gluteal wound less drainage and odor, but remains about the same size, the periwound skin remains oddly scaly. His lower extremity wounds also appear roughly the same size. They continue to accumulate a small amount of slough. The periwound on his feet and ankle wounds has dry eschar and loose dead skin. We have been using silver alginate on the gluteal wound and Iodoflex on his feet and ankle wounds. T the periwound around  his gluteal lesion and Lotrisone on his feet and legs. o 02/23/2022: The right plantar foot wound is closed. The gluteal site looks small but has continued to produce hypertrophic granulation tissue. The foot wounds all look about the same on the dorsal surface of the right foot; on the left, there is only a small open area at the site of where his left great toenail would have been.  03/16/2022: The right ankle wound is healed. The right dorsal foot wound is about the same. The left dorsal foot wound is quite a bit smaller and the ischial wound is nearly closed. 03/30/2022: The right ankle wound reopened. Both dorsal foot wounds are quite a bit smaller. Unfortunately, he appears to have sheared part of his ischial wound open further, perhaps during a transfer. 04/13/2022: The right ankle wound has hypertrophic granulation tissue present. The dorsal foot wounds continue to decrease in size. The ischial wound looks about the same today, no better, no worse. 04/27/2022: The right ankle wound has closed. Unfortunately, it looks like some moisture got underneath the dry skin on both of his dorsal feet and these wounds have expanded in size. The ischial wound remains the same with perhaps a little bit more slough accumulation than at our previous visit. 05/11/2022: The right ankle wound remains closed. There is a left anterior tibial wound that is small has patchy openings with accumulated slough. The dorsum of his right foot appears to be nearly healed with just a small punctate opening. The plantar surface of his right foot has a new opening that looks like he may have picked some skin there. His sacral ulcer has hypertrophic granulation tissue but has some slough accumulation. The dorsum of his left foot has multiple open areas in a fairly ragged distribution. All of these have slough accumulated within them. 06/01/2022: The right ankle and left anterior tibial wound are both closed. Dorsum of his right foot and  left foot both look substantially better with just tiny scattered openings The without any slough accumulation. He has sheared open new areas on his left gluteus and ischium. He says that his wheelchair cushion, which is air-filled, has a leak and so it keeps deflating. He is awaiting a new cushion. 06/15/2022: The right ankle wound has reopened and the fat layer is exposed. Both dorsal feet have just small openings with just a little bit of slough and eschar accumulation. The wound on his left gluteus and ischium is larger again today and has a foul odor. 06/29/2022: The right ankle wound has hypertrophic granulation tissue buildup. His dorsal foot wounds both look better with just some eschar on the surface. He has a new wound on his left lateral ankle. He is not sure how he acquired it but by appearance, it looks that he hit it on something, potentially his wheelchair or bed. The ischial wound is about the same but is cleaner without any significant purulent drainage or odor. He did not understand what the [facility] call was about and therefore he does not have the topical compounded antibiotic. 07/13/2022: The right ankle wound again has hypertrophic granulation tissue, but less so than at his previous visit. The ischial wound has improved tremendously the use of the [facility] topical antibiotic. No significant change to the left lateral ankle wound; it is fibrotic with slough present. The skin of both of his feet, especially on the right, has a yeasty appearance. 08/10/2022: There is again hypertrophic granulation tissue on the right anterior ankle wound. Both feet are about the same. The left lateral ankle wound is a little bit desiccated and has some slough buildup. He unfortunately suffered a new injury when he was removing his pants and they caught his bandage which caused a large skin tear on his left ischium, just distal to the existing wound. The existing left ischial wound, however, is  significantly better with just a little light slough on the  surface. 08/31/2022: The right anterior ankle wound is a lot smaller today underneath some eschar. No accumulation of hypertrophic granulation tissue. The left lateral ankle wound has some slough on the surface but is better in terms of moisture balance this week. The left dorsal foot does not really have any openings on it today. The right dorsal foot has some slough and eschar accumulation. His gluteal ulcer is basically closed aside from 2 small areas that are oozing a bit. 09/21/2022: The right anterior ankle wound is down to just a tiny pinhole. The left lateral ankle wound has accumulated slough again, but moisture balance is good. Left and right dorsal feet both have 2 small openings with some slough in them. The gluteal ulcer, unfortunately has opened up substantially. 10/05/2022: The right anterior ankle wound has reopened and has a fair amount of slough on the surface. The left lateral ankle wound has accumulated more slough, as well. He was approved for Apligraf, but the wound is not clean enough yet. There is some slough buildup on the dorsal right foot wound, as well as on his ischium. 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 10/26/2022: The right anterior ankle wound has closed again. The left lateral ankle wound looks quite a bit better. It is ready for Apligraf but we do not have 1 here for him. His ischium continues to get worse, with the wound larger and deeper. It really seems like this is a combination of pressure and friction. He has 2 new wounds, 1 on the plantar aspect of each foot. He says that he had some dry skin there that peeled away. Both are superficial. The dorsum of his left foot does not have any new wound opening. The dorsum of his right foot has a bit of slough accumulation. 11/24/2022: The right anterior ankle wound has 2 small open areas, both  covered with eschar. The left lateral ankle wound has closed and considerably with just a Ryce, Dom E ([id number]) [id number].pdf Page 35 of 40 little slough on the surface. The ischium has improved most significantly, having closed in most of the open area with just a few small remaining superficial openings. The dorsal foot wounds are small and superficial. The bottoms of his feet are closed. 12/07/2022: The right anterior ankle wound remains open with 2 small areas, again with slough and eschar present. The left lateral ankle wound is down to about half a centimeter with just some slough on the surface. His right dorsal foot has opened up more and has both slough and eschar present. The ischium has continued to improve and now just has 2 small open areas that are very clean. 12/21/2022: He has a new wound on his right lateral ankle. There is some slough present. The left lateral ankle is quite a bit smaller with some slough buildup. His left ischial ulcer is also nearly closed; there is just a tear in his skin that seems likely to be secondary to a transfer mishap. He has a new ulcer in his natal cleft that looks like it may be secondary to moisture. There is some slough in this area as well. The right anterior ankle is nearly closed. The right dorsal foot wound has some slough accumulation 01/04/2023: The anterior right ankle wound is closed, as is his ischial ulcer. The left lateral ankle wound is nearly closed with just a little bit of slough accumulation. The ulcer in his  natal cleft is about the same size with slough buildup there, as well. He has a new wound on his left lateral leg near the knee from rubbing on his wheelchair. There is slough present, but no signs of infection. The right dorsal foot wound has a fairly heavy layer of slough buildup, as well. 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 He has been using his old wheelchair cushion for reasons  that are unclear. The ulcer in his natal cleft has healed. The wound on his left lateral leg near the knee is smaller with some slough present. The right dorsal foot wound has accumulated slough again. The right lateral ankle wound is healed. The left lateral ankle wound is smaller again this week. 02/01/2023: His ischial ulcer has had a ton of drainage over the last 2 weeks and the drainage has an absolutely putrid odor. He has gone back to using his new wheelchair chair cushion, though. The wound on his left lateral leg near his knee has healed. The left lateral ankle wound is nearly healed. Both the dorsal foot wounds are larger and have a lot of slough accumulation and drainage. The wound on his right anterior ankle is healed but there is a new 1 just proximal to this, also with slough and eschar present. 02/15/2023:The left lateral and right lateral ankle wounds are both smaller. The right anterior ankle wound has some eschar on the surface. Both dorsal feet have a lot of slough accumulation. The ischial ulcer has epithelialized in multiple areas, leaving several smaller areas that are quite friable and still with a pungent odor. His new [facility] prescription should be delivered tomorrow. We are using silver alginate on everything at this point. 03/08/2023: Both dorsal foot wounds are about the same size and have a lot of slough buildup. The ischial ulcer has improved quite a bit. The odor has abated and the surfaces are clean. It is still quite friable and 1 area is oozing. The left lateral and right lateral ankle wounds are nearly closed. The right anterior ankle wound is healed. 03/22/2023: The right lateral ankle wound is healed. The left lateral ankle wound is tiny with just a little bit of slough accumulation. The dorsal foot wounds look about the same. The ischial ulcer is considerably smaller and there is no odor. There is slight slough accumulation. 04/14/2023: The right lateral ankle wound  has reopened. It is fairly superficial but does have some hematoma in the tissue suggesting trauma or pressure as the etiology. The left lateral ankle wound is essentially a pinhole under some dry eschar. The dorsal foot wounds are stable. The ischial ulcer, unfortunately, has expanded to 5 separate openings in his skin. The wounds are clean but friable. 04/28/2023: The left lateral ankle wound has expanded and is a little bit deeper today. Dorsal foot wounds are stable. The ischial ulcer is less friable and does not have the strong odor that has been present. The right lateral ankle wound is a little bit smaller with some slough and eschar accumulation. 05/10/2023: The total area of the ischial wound is smaller. The left lateral ankle wound is nearly closed. Both dorsal foot wounds are quite a bit smaller. The right ankle wound has healed. 05/31/2023: The left lateral leg wound is down to just a superficial small area that almost looks like a scrape rather than the deeper ulcer it has been. His ischial ulcer is down to just 1 open spot. It is clean but quite friable.  Both dorsal foot ulcers are considerably smaller and have much less slough than usual. 06/21/2023: His ischial ulcer is still just 1 open area, but it is a little larger today. The surrounding skin, however, is in better condition Unfortunately, the left lateral leg wound is a little bit larger today. It looks as though some dry skin was peeled away. Both dorsal foot ulcers continue to contract; they are the smallest I have ever seen them and have only minimal slough on the surface. 07/12/2023: All of his wounds have deteriorated and his right anterior ankle ulcer is open again. He has had a lot more drainage, although he states it is clear and does not have any significant odor. This is resulted in breakdown around the left lateral ankle ulcer, both dorsal feet and has extended to the plantar surface of both feet. His ischial ulcer is also  larger; this appears to be more due to shear than anything else. 08/02/2023: All of his wounds look better. There does not appear to be any rhyme or reason as to why they wax and wane as they do. 08/23/2023: The right dorsal ankle wound is covered with a extremely thin layer of skin but is still purple underneath. The left lateral ankle wound has quite a bit of slough on it and it appears that the tendon is exposed again. His ischial ulcer is down to 4 open areas, all of which are smaller than previously, but the surfaces remain friable and have some slough on them today. The dorsal foot wounds are both considerably smaller and the right plantar ulcer is closed. The left plantar ulcer is nearly closed. 09/26/2023: The right dorsal ankle wound is basically healed. The left lateral ankle wound is smaller. His ischial ulcers have merged and are a bit larger today. Both dorsal foot wounds are stable. The left plantar ulcer is closed but the right plantar ulcer has reopened. 12/3; the patient comes in today for what I gather is monthly follow-up. Unfortunately things are are not going very well. He has multiple wounds including on his right dorsal foot left dorsal foot left lateral lower extremity left posterior lower extremity left buttock and right anterior lower extremity. The buttock wound which I gather is a pressure ulcer in this paraplegic patient is quite a bit worse at the top and area of necrotic granulation tissue. He has been using mupirocin  silver alginate to all the wounds and his own compression stockings in his legs. I am not sure how much he keeps his legs elevated but he has been coached on this before 11/07/2023: Apparently the buttock area looks better today than it did at his previous visit. There is no necrosis present. It is still larger than when I saw it in November. The left lateral ankle wound is larger and appears deeper under a thick layer of slough. Both feet have deteriorated and  the wounds are larger with slough build up. The right anterior ankle wound is open again. 11/22/2023: All of the wounds are looking better today. The lateral left leg wound and left plantar foot wounds have epithelialized significantly. There are fewer open areas on the ischium and these all look healthier. The dorsal foot wounds and left ankle wound have their usual accumulation of slough. 12/06/2023: The wound on he lateral left knee is healed. The left ankle wound is larger and has quite a bit of slough. The dorsal foot wounds are smaller again, but have accumulated thick slough. The plantar surface of  the right foot has broken down a bit and has a fungal odor to it. The right anterior ankle wound is smaller. The ischial area looks better than usual. Objective Barrie, Manuelito E ([id number]) [phone].pdf Page 36 of 40 Constitutional no acute distress. Vitals Time Taken: 7:50 AM, Height: 70 in, Weight: 216 lbs, BMI: 31, Temperature: 98.3 F, Pulse: 125 bpm, Respiratory Rate: 18 breaths/min, Blood Pressure: 136/79 mmHg. Respiratory Normal work of breathing on room air.. General Notes: 12/06/2023: The wound on he lateral left knee is healed. The left ankle wound is larger and has quite a bit of slough. The dorsal foot wounds are smaller again, but have accumulated thick slough. The plantar surface of the right foot has broken down a bit and has a fungal odor to it. The right anterior ankle wound is smaller. The ischial area looks better than usual. Integumentary (Hair, Skin) Wound #52 status is Open. Original cause of wound was Gradually Appeared. The date acquired was: 03/27/2021. The wound has been in treatment 140 weeks. The wound is located on the Right,Dorsal Foot. The wound measures 2.2cm length x 6.2cm width x 0.1cm depth; 10.713cm^2 area and 1.071cm^3 volume. There is Fat Layer (Subcutaneous Tissue) exposed. There is no tunneling or undermining noted. There is a medium  amount of purulent drainage noted. The wound margin is distinct with the outline attached to the wound base. There is small (1-33%) pink granulation within the wound bed. There is a large (67-100%) amount of necrotic tissue within the wound bed including Adherent Slough. The periwound skin appearance had no abnormalities noted for texture. The periwound skin appearance exhibited: Rubor. The periwound skin appearance did not exhibit: Dry/Scaly, Maceration. Periwound temperature was noted as No Abnormality. Wound #56 status is Open. Original cause of wound was Gradually Appeared. The date acquired was: 07/11/2021. The wound has been in treatment 124 weeks. The wound is located on the Left,Dorsal Foot. The wound measures 5cm length x 3cm width x 0.1cm depth; 11.781cm^2 area and 1.178cm^3 volume. There is Fat Layer (Subcutaneous Tissue) exposed. There is no tunneling or undermining noted. There is a medium amount of purulent drainage noted. The wound margin is flat and intact. There is small (1-33%) pink granulation within the wound bed. There is a large (67-100%) amount of necrotic tissue within the wound bed including Adherent Slough. The periwound skin appearance exhibited: Excoriation, Maceration, Rubor. The periwound skin appearance did not exhibit: Dry/Scaly, Erythema. Periwound temperature was noted as No Abnormality. Wound #67 status is Open. Original cause of wound was Gradually Appeared. The date acquired was: 06/22/2022. The wound has been in treatment 75 weeks. The wound is located on the Left,Lateral Lower Leg. The wound measures 2.3cm length x 1.8cm width x 0.1cm depth; 3.252cm^2 area and 0.325cm^3 volume. There is Fat Layer (Subcutaneous Tissue) exposed. There is no tunneling or undermining noted. There is a medium amount of serosanguineous drainage noted. The wound margin is distinct with the outline attached to the wound base. There is no granulation within the wound bed. There is a large  (67-100%) amount of necrotic tissue within the wound bed including Adherent Slough. The periwound skin appearance exhibited: Scarring, Dry/Scaly, Rubor. The periwound skin appearance did not exhibit: Erythema. Periwound temperature was noted as No Abnormality. Wound #74 status is Open. Original cause of wound was Pressure Injury. The date acquired was: 01/10/2023. The wound has been in treatment 46 weeks. The wound is located on the Left,Posterior Upper Leg. The wound measures 12.5cm length x 4cm width  x 0.1cm depth; 39.27cm^2 area and 3.927cm^3 volume. There is Fat Layer (Subcutaneous Tissue) exposed. There is no tunneling or undermining noted. There is a medium amount of serosanguineous drainage noted. The wound margin is flat and intact. There is large (67-100%) red, friable granulation within the wound bed. There is a small (1-33%) amount of necrotic tissue within the wound bed including Adherent Slough. The periwound skin appearance had no abnormalities noted for moisture. The periwound skin appearance had no abnormalities noted for color. The periwound skin appearance exhibited: Scarring. Periwound temperature was noted as No Abnormality. Wound #78 status is Open. Original cause of wound was Gradually Appeared. The date acquired was: 07/04/2023. The wound has been in treatment 21 weeks. The wound is located on the Right,Anterior Ankle. The wound measures 0.9cm length x 1.3cm width x 0.1cm depth; 0.919cm^2 area and 0.092cm^3 volume. There is Fat Layer (Subcutaneous Tissue) exposed. There is no tunneling or undermining noted. There is a small amount of serosanguineous drainage noted. The wound margin is flat and intact. There is no granulation within the wound bed. There is a large (67-100%) amount of necrotic tissue within the wound bed including Eschar and Adherent Slough. The periwound skin appearance had no abnormalities noted for moisture. The periwound skin appearance had no abnormalities noted  for color. The periwound skin appearance exhibited: Scarring. Periwound temperature was noted as No Abnormality. Wound #81 status is Open. Original cause of wound was Gradually Appeared. The date acquired was: 09/23/2023. The wound has been in treatment 10 weeks. The wound is located on the Right,Plantar Foot. The wound measures 2.7cm length x 4.3cm width x 0.1cm depth; 9.118cm^2 area and 0.912cm^3 volume. There is Fat Layer (Subcutaneous Tissue) exposed. There is no tunneling or undermining noted. There is a medium amount of serous drainage noted. The wound margin is flat and intact. There is large (67-100%) pink granulation within the wound bed. There is a small (1-33%) amount of necrotic tissue within the wound bed including Adherent Slough. The periwound skin appearance had no abnormalities noted for texture. The periwound skin appearance exhibited: Maceration, Rubor. Periwound temperature was noted as No Abnormality. Wound #82 status is Open. Original cause of wound was Pressure Injury. The date acquired was: 11/04/2023. The wound has been in treatment 4 weeks. The wound is located on the Left,Proximal,Lateral Lower Leg. The wound measures 0cm length x 0cm width x 0cm depth; 0cm^2 area and 0cm^3 volume. There is no tunneling or undermining noted. There is a none present amount of drainage noted. There is no granulation within the wound bed. There is no necrotic tissue within the wound bed. The periwound skin appearance had no abnormalities noted for texture. The periwound skin appearance had no abnormalities noted for moisture. The periwound skin appearance had no abnormalities noted for color. Periwound temperature was noted as No Abnormality. 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 There was a Excisional Skin/Subcutaneous Tissue Debridement with a total area of 10.71 sq cm performed by Marolyn,  Delon, MD. With the following instrument(s): Curette to remove Viable and Non-Viable tissue/material. Material removed includes Subcutaneous Tissue and Slough and after achieving pain control using Lidocaine  4% T opical Solution. No specimens were taken. A time out was conducted at 08:25, prior to the start of the procedure. A Minimum amount of bleeding was controlled with Pressure. The procedure was tolerated well with a pain level of Insensate throughout and a pain level of Insensate following the procedure. Post Debridement Measurements: 2.2cm length x 6.2cm width x 0.1cm depth; 1.071cm^3 volume. Character of Wound/Ulcer Post Debridement is improved. Severity of Tissue Post Debridement is: Fat layer exposed. 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 There was a Excisional Skin/Subcutaneous Tissue Debridement with a total area of 11.78 sq cm performed by Marolyn Delon, MD. With the following instrument(s): Curette to remove Viable and Non-Viable tissue/material. Material removed includes Subcutaneous Tissue and Slough and after achieving pain control using Lidocaine  4% T opical Solution. No specimens were taken. A time out was conducted at 08:25, prior to the start of the procedure. A Minimum amount of bleeding was controlled with Pressure. The procedure was tolerated well  with a pain level of Insensate throughout and a pain level of Insensate following the procedure. Post Debridement Measurements: 5cm length x 3cm width x 0.1cm depth; 1.178cm^3 volume. Character of Wound/Ulcer Post Debridement is improved. Post procedure Diagnosis Wound #56: Same as Pre-Procedure Wound #67 Pre-procedure diagnosis of Wound #67 is a Venous Leg Ulcer located on the Left,Lateral Lower Leg .Severity of Tissue Pre Debridement is: Fat layer exposed. There was a Excisional Skin/Subcutaneous Tissue Debridement with a total area of 3.25 sq cm performed by Marolyn Delon, MD. With the following instrument(s): Curette to remove Viable and Non-Viable tissue/material. Material removed includes Subcutaneous Tissue and Slough and after achieving pain control using Lidocaine  4% T opical Solution. No specimens were taken. A time out was conducted at 08:25, prior to the start of the procedure. A Minimum amount of bleeding was controlled with Pressure. The procedure was tolerated well with a pain level of Insensate throughout and a pain level of Insensate following the procedure. Post Debridement Measurements: 2.3cm length x 1.8cm width x 0.1cm depth; 0.325cm^3 volume. Character of Wound/Ulcer Post Debridement is improved. Severity of Tissue Post Debridement is: Fat layer exposed. Post procedure Diagnosis Wound #67: Same as Pre-Procedure Wound #74 Pre-procedure diagnosis of Wound #74 is a Pressure Ulcer located on the Left,Posterior Upper Leg . There was a Excisional Skin/Subcutaneous Tissue Debridement with a total area of 21.1 sq cm performed by Marolyn Delon, MD. With the following instrument(s): Curette to remove Viable and Non-Viable tissue/material. Material removed includes Subcutaneous Tissue, Slough, and Biofilm after achieving pain control using Lidocaine  4% T opical Solution. No specimens were taken. A time out was conducted at 08:25, prior to the start of the procedure. A Minimum amount  of bleeding was controlled with Pressure. The procedure was tolerated well with a pain level of Insensate throughout and a pain level of Insensate following the procedure. Post Debridement Measurements: 12.5cm length x 4.3cm width x 0.1cm depth; 4.222cm^3 volume. Post debridement Stage noted as Category/Stage III. Character of Wound/Ulcer Post Debridement is improved. Post procedure Diagnosis Wound #74: Same as Pre-Procedure Wound #78 Pre-procedure diagnosis of Wound #78 is a Venous Leg Ulcer located on the Right,Anterior Ankle .Severity of Tissue Pre Debridement is: Fat layer exposed. There was  a Excisional Skin/Subcutaneous Tissue Debridement with a total area of 0.92 sq cm performed by Marolyn Nest, MD. With the following instrument(s): Curette to remove Viable and Non-Viable tissue/material. Material removed includes Subcutaneous Tissue and Slough and after achieving pain control using Lidocaine  4% T opical Solution. No specimens were taken. A time out was conducted at 08:25, prior to the start of the procedure. A Minimum amount of bleeding was controlled with Pressure. The procedure was tolerated well with a pain level of Insensate throughout and a pain level of Insensate following the procedure. Post Debridement Measurements: 0.9cm length x 1.3cm width x 0.1cm depth; 0.092cm^3 volume. Character of Wound/Ulcer Post Debridement is improved. Severity of Tissue Post Debridement is: Fat layer exposed. Post procedure Diagnosis Wound #78: Same as Pre-Procedure Wound #81 Pre-procedure diagnosis of Wound #81 is a Lymphedema located on the Right,Plantar Foot . There was a Excisional Skin/Subcutaneous Tissue Debridement with a total area of 9.11 sq cm performed by Marolyn Nest, MD. With the following instrument(s): Curette to remove Viable and Non-Viable tissue/material. Material removed includes Subcutaneous Tissue and Slough and after achieving pain control using Lidocaine  4% T opical Solution.  No specimens were taken. A time out was conducted at 08:25, prior to the start of the procedure. A Minimum amount of bleeding was controlled with Pressure. The procedure was tolerated well with a pain level of Insensate throughout and a pain level of Insensate following the procedure. Post Debridement Measurements: 2.7cm length x 4.3cm width x 0.1cm depth; 0.912cm^3 volume. Character of Wound/Ulcer Post Debridement is improved. Post procedure Diagnosis Wound #81: Same as Pre-Procedure Plan Follow-up Appointments: Return appointment in 3 weeks. - Dr. Marolyn ***allow extra time*** Anesthetic: Wound #52 Right,Dorsal Foot: (In clinic) Topical Lidocaine  4% applied to wound bed Bathing/ Shower/ Hygiene: May shower and wash wound with soap and water. - on days that dressing is changed Off-Loading: Roho cushion for wheelchair - use newer cushion Turn and reposition every 2 hours - lift up with arms in wheelchair every hour during the day The following medication(s) was prescribed: ketoconazole topical 2 % cream apply thin layer to affected area with dressing changes starting 12/06/2023 WOUND #52: - Foot Wound Laterality: Dorsal, Right Cleanser: Soap and Water Every Other Day/30 Days Discharge Instructions: May shower and wash wound with dial antibacterial soap and water prior to dressing change. Cleanser: Wound Cleanser Every Other Day/30 Days Discharge Instructions: Cleanse the wound with wound cleanser prior to applying a clean dressing using gauze sponges, not tissue or cotton balls. Peri-Wound Care: Ketoconazole Cream 2% Every Other Day/30 Days Discharge Instructions: Apply Ketoconazole to rashy areas Peri-Wound Care: Sween Lotion (Moisturizing lotion) Every Other Day/30 Days Schuhmacher, Kinte E ([id number]) [id number] Page 34 of 40 Discharge Instructions: Apply Aquaphor moisturizing lotion as directed Topical: Mupirocin  Ointment Every Other Day/30 Days Discharge  Instructions: Apply Mupirocin  (Bactroban ) as instructed Prim Dressing: Maxorb Extra Ag+ Alginate Dressing, 2x2 (in/in) Every Other Day/30 Days ary Discharge Instructions: Apply to wound bed as instructed Secondary Dressing: Woven Gauze Sponge, Non-Sterile 4x4 in Every Other Day/30 Days Discharge Instructions: Apply over primary dressing as directed. Secured With: [employer], 4.5x3.1 (in/yd) (Generic) Every Other Day/30 Days Discharge Instructions: Secure with Kerlix as directed. Secured With: 67M Medipore H Soft Cloth Surgical T ape, 4 x 10 (in/yd) (Generic) Every Other Day/30 Days Discharge Instructions: Secure with tape as directed. WOUND #56: - Foot Wound Laterality: Dorsal, Left Cleanser: Soap and Water Every Other Day/30 Days Discharge Instructions: May shower and wash wound with  dial antibacterial soap and water prior to dressing change. Cleanser: Wound Cleanser Every Other Day/30 Days Discharge Instructions: Cleanse the wound with wound cleanser prior to applying a clean dressing using gauze sponges, not tissue or cotton balls. Peri-Wound Care: Ketoconazole Cream 2% Every Other Day/30 Days Discharge Instructions: Apply Ketoconazole to rashy areas Peri-Wound Care: Sween Lotion (Moisturizing lotion) Every Other Day/30 Days Discharge Instructions: Apply Aquaphor moisturizing lotion as directed Topical: Mupirocin  Ointment Every Other Day/30 Days Discharge Instructions: Apply Mupirocin  (Bactroban ) as instructed Prim Dressing: Maxorb Extra Ag+ Alginate Dressing, 2x2 (in/in) Every Other Day/30 Days ary Discharge Instructions: Apply to wound bed as instructed Secondary Dressing: Woven Gauze Sponge, Non-Sterile 4x4 in Every Other Day/30 Days Discharge Instructions: Apply over primary dressing as directed. Secured With: [employer], 4.5x3.1 (in/yd) (Generic) Every Other Day/30 Days Discharge Instructions: Secure with Kerlix as directed. Secured With: 40M Medipore H Soft Cloth  Surgical T ape, 4 x 10 (in/yd) (Generic) Every Other Day/30 Days Discharge Instructions: Secure with tape as directed. WOUND #67: - Lower Leg Wound Laterality: Left, Lateral Cleanser: Soap and Water Every Other Day/30 Days Discharge Instructions: May shower and wash wound with dial antibacterial soap and water prior to dressing change. Cleanser: Wound Cleanser Every Other Day/30 Days Discharge Instructions: Cleanse the wound with wound cleanser prior to applying a clean dressing using gauze sponges, not tissue or cotton balls. Peri-Wound Care: Ketoconazole Cream 2% Every Other Day/30 Days Discharge Instructions: Apply Ketoconazole to rashy areas Peri-Wound Care: Sween Lotion (Moisturizing lotion) Every Other Day/30 Days Discharge Instructions: Apply Aquaphor moisturizing lotion as directed Topical: Mupirocin  Ointment Every Other Day/30 Days Discharge Instructions: Apply Mupirocin  (Bactroban ) as instructed Prim Dressing: Maxorb Extra Ag+ Alginate Dressing, 2x2 (in/in) Every Other Day/30 Days ary Discharge Instructions: Apply to wound bed as instructed Secondary Dressing: Woven Gauze Sponge, Non-Sterile 4x4 in Every Other Day/30 Days Discharge Instructions: Apply over primary dressing as directed. Secured With: [employer], 4.5x3.1 (in/yd) (Generic) Every Other Day/30 Days Discharge Instructions: Secure with Kerlix as directed. Secured With: 40M Medipore H Soft Cloth Surgical T ape, 4 x 10 (in/yd) (Generic) Every Other Day/30 Days Discharge Instructions: Secure with tape as directed. WOUND #74: - Upper Leg Wound Laterality: Left, Posterior Cleanser: Vashe 5.8 (oz) 1 x Per Day/30 Days Discharge Instructions: Cleanse the wound with Vashe prior to applying a clean dressing using gauze sponges , let sit on wound for 10 minutes Peri-Wound Care: Zinc Oxide Ointment 30g tube 1 x Per Day/30 Days Discharge Instructions: Apply Zinc Oxide to periwound with each dressing change as needed fpr  moisture Topical: Mupirocin  Ointment 1 x Per Day/30 Days Discharge Instructions: Apply Mupirocin  (Bactroban ) as instructed Topical: [facility] antibiotic compound 1 x Per Day/30 Days Prim Dressing: Maxorb Extra Ag+ Alginate Dressing, 4x4.75 (in/in) 1 x Per Day/30 Days ary Discharge Instructions: Apply to wound bed as instructed Secondary Dressing: Zetuvit Plus Silicone Border Sacrum Dressing, Sm, 7x7 (in/in) 1 x Per Day/30 Days Discharge Instructions: Apply silicone border over primary dressing as directed. Secured With: 40M Medipore H Soft Cloth Surgical T ape, 4 x 10 (in/yd) 1 x Per Day/30 Days Discharge Instructions: Secure with tape as directed. WOUND #78: - Ankle Wound Laterality: Right, Anterior Cleanser: Soap and Water Every Other Day/30 Days Discharge Instructions: May shower and wash wound with dial antibacterial soap and water prior to dressing change. Cleanser: Wound Cleanser Every Other Day/30 Days Discharge Instructions: Cleanse the wound with wound cleanser prior to applying a clean dressing using gauze sponges, not tissue  or cotton balls. Peri-Wound Care: Ketoconazole Cream 2% Every Other Day/30 Days Discharge Instructions: Apply Ketoconazole to rashy areas Peri-Wound Care: Sween Lotion (Moisturizing lotion) Every Other Day/30 Days Discharge Instructions: Apply Aquaphor moisturizing lotion as directed Topical: Mupirocin  Ointment Every Other Day/30 Days Discharge Instructions: Apply Mupirocin  (Bactroban ) as instructed Prim Dressing: Maxorb Extra Ag+ Alginate Dressing, 2x2 (in/in) Every Other Day/30 Days ary Discharge Instructions: Apply to wound bed as instructed Secondary Dressing: Woven Gauze Sponge, Non-Sterile 4x4 in Every Other Day/30 Days Discharge Instructions: Apply over primary dressing as directed. Secured With: [employer], 4.5x3.1 (in/yd) (Generic) Every Other Day/30 Days Discharge Instructions: Secure with Kerlix as directed. Secured With: 66M Medipore H  Soft Cloth Surgical T ape, 4 x 10 (in/yd) (Generic) Every Other Day/30 Days Discharge Instructions: Secure with tape as directed. WOUND #81: - Foot Wound Laterality: Plantar, Right Cleanser: Soap and Water Every Other Day/30 Days Discharge Instructions: May shower and wash wound with dial antibacterial soap and water prior to dressing change. Cleanser: Wound Cleanser Every Other Day/30 Days Discharge Instructions: Cleanse the wound with wound cleanser prior to applying a clean dressing using gauze sponges, not tissue or cotton balls. Peri-Wound Care: Ketoconazole Cream 2% Every Other Day/30 Days Discharge Instructions: Apply Ketoconazole to rashy areas Peri-Wound Care: Sween Lotion (Moisturizing lotion) Every Other Day/30 Days Discharge Instructions: Apply Aquaphor moisturizing lotion as directed Topical: Mupirocin  Ointment Every Other Day/30 Days Raz, Aquila E ([id number]) [id number] Page 39 of 40 Discharge Instructions: Apply Mupirocin  (Bactroban ) as instructed Prim Dressing: Maxorb Extra Ag+ Alginate Dressing, 2x2 (in/in) Every Other Day/30 Days ary Discharge Instructions: Apply to wound bed as instructed Secondary Dressing: Woven Gauze Sponge, Non-Sterile 4x4 in Every Other Day/30 Days Discharge Instructions: Apply over primary dressing as directed. Secured With: [employer], 4.5x3.1 (in/yd) (Generic) Every Other Day/30 Days Discharge Instructions: Secure with Kerlix as directed. Secured With: 66M Medipore H Soft Cloth Surgical T ape, 4 x 10 (in/yd) (Generic) Every Other Day/30 Days Discharge Instructions: Secure with tape as directed. 12/06/2023: The wound on he lateral left knee is healed. The left ankle wound is larger and has quite a bit of slough. The dorsal foot wounds are smaller again, but have accumulated thick slough. The plantar surface of the right foot has broken down a bit and has a fungal odor to it. The right anterior ankle wound  is smaller. The ischial area looks better than usual. I used a curette to debride slough and subcutaneous tissue from all of his wounds. We will continue with the mupirocin  with silver alginate to all of his wounds, with the exception of the plantar right foot wound, where we are using ketoconazole. I renewed his prescription for this. We will follow-up in 3 weeks. Electronic Signature(s) Signed: 12/06/2023 8:48:42 AM By: Marolyn Nest MD FACS Previous Signature: 12/06/2023 8:45:08 AM Version By: Marolyn Nest MD FACS Entered By: Marolyn Nest on 12/06/2023 08:48:42 -------------------------------------------------------------------------------- SuperBill Details Patient Name: Date of Service: Zahler, [name] 12/06/2023 Medical Record Number: [id number] Patient Account Number: [id number] Date of Birth/Sex: Treating RN: Jun 21, 1986 (37 y.o. 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, stage 3 L97.312 Non-pressure chronic ulcer of right ankle with fat  layer exposed L97.322 Non-pressure chronic ulcer of left ankle with fat layer exposed G82.21 Paraplegia, complete Facility Procedures : CPT4 Code: 63899987 Description: 11042 - DEB SUBQ TISSUE 20 SQ CM/< ICD-10 Diagnosis Description L97.522 Non-pressure chronic ulcer of other part of left foot with fat layer exposed L97.512 Non-pressure chronic ulcer of other part of right foot with fat layer exposed  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, 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 Modifier: Quantity: 1 : CPT4  Code: 63899981 Description: 11045 - DEB SUBQ TISS EA ADDL 20CM ICD-10 Diagnosis Description 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, stage 3 L97.512 Non-pressure chronic ulcer of other part of right  foot with fat layer exposed L97.522 Non-pressure chronic ulcer of other part of left foot with fat layer exposed Modifier: Quantity: 2 Physician Procedures : CPT4 Code Description Modifier [id number] 99214 - WC PHYS LEVEL 4 - EST PT Mccullars, Orrie E ([id number]) [id number] P Quantity: 1 age 76 of 69 : ICD-10 Diagnosis Description L97.522 Non-pressure chronic ulcer of other part of left foot with fat layer exposed L97.512 Non-pressure chronic ulcer of other part of right foot with fat layer exposed 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, stage 3  L97.322 Non-pressure chronic ulcer of left ankle with fat layer exposed Quantity: : [id number] 11042 - WC PHYS SUBQ TISS 20 SQ CM 1 ICD-10 Diagnosis Description L97.522 Non-pressure chronic ulcer of other part of left foot with fat layer exposed L97.512 Non-pressure chronic ulcer of other part of right foot with fat layer exposed L89.323  Pressure ulcer of left buttock, 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 Quantity: : [id number] 11045 - WC PHYS SUBQ TISS EA ADDL 20 CM 2 ICD-10 Diagnosis Description 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

## 2023-12-20 DIAGNOSIS — G8310 Monoplegia of lower limb affecting unspecified side: Secondary | ICD-10-CM | POA: Diagnosis not present

## 2023-12-20 DIAGNOSIS — Z79899 Other long term (current) drug therapy: Secondary | ICD-10-CM | POA: Diagnosis not present

## 2023-12-20 DIAGNOSIS — R350 Frequency of micturition: Secondary | ICD-10-CM | POA: Diagnosis not present

## 2023-12-20 DIAGNOSIS — M62838 Other muscle spasm: Secondary | ICD-10-CM | POA: Diagnosis not present

## 2023-12-20 DIAGNOSIS — D649 Anemia, unspecified: Secondary | ICD-10-CM | POA: Diagnosis not present

## 2023-12-27 ENCOUNTER — Encounter (HOSPITAL_BASED_OUTPATIENT_CLINIC_OR_DEPARTMENT_OTHER): Payer: BC Managed Care – PPO | Attending: General Surgery | Admitting: General Surgery

## 2023-12-27 DIAGNOSIS — G8220 Paraplegia, unspecified: Secondary | ICD-10-CM | POA: Diagnosis not present

## 2023-12-27 DIAGNOSIS — L97322 Non-pressure chronic ulcer of left ankle with fat layer exposed: Secondary | ICD-10-CM | POA: Insufficient documentation

## 2023-12-27 DIAGNOSIS — L97512 Non-pressure chronic ulcer of other part of right foot with fat layer exposed: Secondary | ICD-10-CM | POA: Insufficient documentation

## 2023-12-27 DIAGNOSIS — L97312 Non-pressure chronic ulcer of right ankle with fat layer exposed: Secondary | ICD-10-CM | POA: Diagnosis not present

## 2023-12-27 DIAGNOSIS — L89323 Pressure ulcer of left buttock, stage 3: Secondary | ICD-10-CM | POA: Insufficient documentation

## 2023-12-27 DIAGNOSIS — L97822 Non-pressure chronic ulcer of other part of left lower leg with fat layer exposed: Secondary | ICD-10-CM | POA: Diagnosis not present

## 2023-12-27 DIAGNOSIS — L97522 Non-pressure chronic ulcer of other part of left foot with fat layer exposed: Secondary | ICD-10-CM | POA: Insufficient documentation

## 2023-12-27 DIAGNOSIS — I872 Venous insufficiency (chronic) (peripheral): Secondary | ICD-10-CM | POA: Diagnosis not present

## 2024-01-02 ENCOUNTER — Encounter: Payer: Self-pay | Admitting: Oncology

## 2024-01-09 ENCOUNTER — Inpatient Hospital Stay: Payer: BC Managed Care – PPO

## 2024-01-09 ENCOUNTER — Inpatient Hospital Stay: Payer: BC Managed Care – PPO | Attending: Hematology and Oncology | Admitting: Hematology and Oncology

## 2024-01-09 ENCOUNTER — Telehealth: Payer: Self-pay | Admitting: Hematology and Oncology

## 2024-01-09 ENCOUNTER — Other Ambulatory Visit: Payer: Self-pay

## 2024-01-09 ENCOUNTER — Other Ambulatory Visit: Payer: Self-pay | Admitting: Hematology and Oncology

## 2024-01-09 ENCOUNTER — Encounter: Payer: Self-pay | Admitting: Hematology and Oncology

## 2024-01-09 VITALS — BP 137/80 | HR 74 | Temp 97.8°F | Resp 20 | Ht 70.0 in

## 2024-01-09 DIAGNOSIS — Z79899 Other long term (current) drug therapy: Secondary | ICD-10-CM | POA: Insufficient documentation

## 2024-01-09 DIAGNOSIS — B954 Other streptococcus as the cause of diseases classified elsewhere: Secondary | ICD-10-CM | POA: Diagnosis not present

## 2024-01-09 DIAGNOSIS — D500 Iron deficiency anemia secondary to blood loss (chronic): Secondary | ICD-10-CM | POA: Diagnosis not present

## 2024-01-09 DIAGNOSIS — D509 Iron deficiency anemia, unspecified: Secondary | ICD-10-CM | POA: Insufficient documentation

## 2024-01-09 DIAGNOSIS — N390 Urinary tract infection, site not specified: Secondary | ICD-10-CM | POA: Insufficient documentation

## 2024-01-09 LAB — CBC WITH DIFFERENTIAL (CANCER CENTER ONLY)
Abs Immature Granulocytes: 0 10*3/uL (ref 0.00–0.07)
Basophils Absolute: 0.1 10*3/uL (ref 0.0–0.1)
Basophils Relative: 1 %
Eosinophils Absolute: 0.5 10*3/uL (ref 0.0–0.5)
Eosinophils Relative: 5 %
HCT: 30.5 % — ABNORMAL LOW (ref 39.0–52.0)
Hemoglobin: 9.7 g/dL — ABNORMAL LOW (ref 13.0–17.0)
Immature Granulocytes: 0 %
Lymphocytes Relative: 36 %
Lymphs Abs: 3.2 10*3/uL (ref 0.7–4.0)
MCH: 21.5 pg — ABNORMAL LOW (ref 26.0–34.0)
MCHC: 31.8 g/dL (ref 30.0–36.0)
MCV: 67.6 fL — ABNORMAL LOW (ref 80.0–100.0)
Monocytes Absolute: 1 10*3/uL (ref 0.1–1.0)
Monocytes Relative: 11 %
Neutro Abs: 4.2 10*3/uL (ref 1.7–7.7)
Neutrophils Relative %: 47 %
Platelet Count: 601 10*3/uL — ABNORMAL HIGH (ref 150–400)
RBC: 4.51 MIL/uL (ref 4.22–5.81)
RDW: 19.3 % — ABNORMAL HIGH (ref 11.5–15.5)
WBC Count: 9 10*3/uL (ref 4.0–10.5)
nRBC: 0 % (ref 0.0–0.2)
nRBC: 0 /100{WBCs}

## 2024-01-09 NOTE — Telephone Encounter (Signed)
 Patient has been scheduled for follow-up visit per 01/09/24 LOS.  Pt given an appt calendar with date and time.
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
## 2024-01-10 ENCOUNTER — Inpatient Hospital Stay: Payer: BC Managed Care – PPO

## 2024-01-10 VITALS — BP 124/80 | HR 87 | Temp 98.0°F | Resp 18

## 2024-01-10 DIAGNOSIS — N390 Urinary tract infection, site not specified: Secondary | ICD-10-CM | POA: Diagnosis not present

## 2024-01-10 DIAGNOSIS — D509 Iron deficiency anemia, unspecified: Secondary | ICD-10-CM | POA: Diagnosis not present

## 2024-01-10 DIAGNOSIS — D500 Iron deficiency anemia secondary to blood loss (chronic): Secondary | ICD-10-CM

## 2024-01-10 DIAGNOSIS — B954 Other streptococcus as the cause of diseases classified elsewhere: Secondary | ICD-10-CM | POA: Diagnosis not present

## 2024-01-10 DIAGNOSIS — Z79899 Other long term (current) drug therapy: Secondary | ICD-10-CM | POA: Diagnosis not present

## 2024-01-10 MED ORDER — SODIUM CHLORIDE 0.9 % IV SOLN: INTRAVENOUS | Status: DC

## 2024-01-10 MED ORDER — SODIUM CHLORIDE 0.9 % IV SOLN
510.0000 mg | Freq: Once | INTRAVENOUS | Status: AC
  Administered 2024-01-10: 510 mg via INTRAVENOUS
  Filled 2024-01-10: qty 510

## 2024-01-10 MED ORDER — ACETAMINOPHEN 325 MG PO TABS
650.0000 mg | ORAL_TABLET | Freq: Once | ORAL | Status: AC
  Administered 2024-01-10: 650 mg via ORAL
  Filled 2024-01-10: qty 2

## 2024-01-10 MED ORDER — LORATADINE 10 MG PO TABS
10.0000 mg | ORAL_TABLET | Freq: Once | ORAL | Status: AC
  Administered 2024-01-10: 10 mg via ORAL
  Filled 2024-01-10: qty 1

## 2024-01-10 NOTE — Patient Instructions (Signed)
 Ferumoxytol Injection What is this medication? FERUMOXYTOL (FER ue MOX i tol) treats low levels of iron in your body (iron deficiency anemia). Iron is a mineral that plays an important role in making red blood cells, which carry oxygen from your lungs to the rest of your body. This medicine may be used for other purposes; ask your health care provider or pharmacist if you have questions. COMMON BRAND NAME(S): Feraheme What should I tell my care team before I take this medication? They need to know if you have any of these conditions: Anemia not caused by low iron levels High levels of iron in the blood Magnetic resonance imaging (MRI) test scheduled An unusual or allergic reaction to iron, other medications, foods, dyes, or preservatives Pregnant or trying to get pregnant Breastfeeding How should I use this medication? This medication is injected into a vein. It is given by your care team in a hospital or clinic setting. Talk to your care team the use of this medication in children. Special care may be needed. Overdosage: If you think you have taken too much of this medicine contact a poison control center or emergency room at once. NOTE: This medicine is only for you. Do not share this medicine with others. What if I miss a dose? It is important not to miss your dose. Call your care team if you are unable to keep an appointment. What may interact with this medication? Other iron products This list may not describe all possible interactions. Give your health care provider a list of all the medicines, herbs, non-prescription drugs, or dietary supplements you use. Also tell them if you smoke, drink alcohol, or use illegal drugs. Some items may interact with your medicine. What should I watch for while using this medication? Visit your care team for regular checks on your progress. Tell your care team if your symptoms do not start to get better or if they get worse. You may need blood work done  while you are taking this medication. You may need to eat more foods that contain iron. Talk to your care team. Foods that contain iron include whole grains or cereals, dried fruits, beans, peas, leafy green vegetables, and organ meats (liver, kidney). What side effects may I notice from receiving this medication? Side effects that you should report to your care team as soon as possible: Allergic reactions--skin rash, itching, hives, swelling of the face, lips, tongue, or throat Low blood pressure--dizziness, feeling faint or lightheaded, blurry vision Shortness of breath Side effects that usually do not require medical attention (report to your care team if they continue or are bothersome): Flushing Headache Joint pain Muscle pain Nausea Pain, redness, or irritation at injection site This list may not describe all possible side effects. Call your doctor for medical advice about side effects. You may report side effects to FDA at 1-800-FDA-1088. Where should I keep my medication? This medication is given in a hospital or clinic. It will not be stored at home. NOTE: This sheet is a summary. It may not cover all possible information. If you have questions about this medicine, talk to your doctor, pharmacist, or health care provider.  2024 Elsevier/Gold Standard (2023-06-29 00:00:00)

## 2024-01-11 ENCOUNTER — Encounter: Payer: Self-pay | Admitting: Hematology and Oncology

## 2024-01-11 LAB — CELIAC DISEASE PANEL
Endomysial Ab, IgA: NEGATIVE
IgA: 760 mg/dL — ABNORMAL HIGH (ref 90–386)
Tissue Transglutaminase Ab, IgA: <2 U/mL (ref 0–3)

## 2024-01-12 ENCOUNTER — Encounter: Payer: Self-pay | Admitting: Hematology and Oncology

## 2024-01-13 ENCOUNTER — Telehealth: Payer: Self-pay

## 2024-01-13 NOTE — Addendum Note (Signed)
 Addended by: Lianne Bushy on: 01/13/2024 01:47 PM   Modules accepted: Orders

## 2024-01-13 NOTE — Telephone Encounter (Signed)
 Referral email/faxed to Dr Gerilyn Pilgrim office.

## 2024-01-13 NOTE — Telephone Encounter (Signed)
-----   Message from Adah Perl sent at 01/11/2024  1:40 PM EST ----- Please refer to GI for iron deficiency anemia. Thanks

## 2024-01-16 ENCOUNTER — Ambulatory Visit (HOSPITAL_BASED_OUTPATIENT_CLINIC_OR_DEPARTMENT_OTHER)
Admission: EM | Admit: 2024-01-16 | Discharge: 2024-01-16 | Disposition: A | Discharge status: Home / Self Care | Payer: BC Managed Care – PPO | Attending: Family Medicine | Admitting: Family Medicine

## 2024-01-16 ENCOUNTER — Inpatient Hospital Stay: Payer: BC Managed Care – PPO

## 2024-01-16 ENCOUNTER — Encounter (HOSPITAL_BASED_OUTPATIENT_CLINIC_OR_DEPARTMENT_OTHER): Payer: Self-pay | Admitting: Family Medicine

## 2024-01-16 VITALS — BP 122/74 | HR 104 | Temp 98.0°F | Resp 18

## 2024-01-16 DIAGNOSIS — Z993 Dependence on wheelchair: Secondary | ICD-10-CM | POA: Insufficient documentation

## 2024-01-16 DIAGNOSIS — R32 Unspecified urinary incontinence: Secondary | ICD-10-CM | POA: Diagnosis not present

## 2024-01-16 DIAGNOSIS — Z79899 Other long term (current) drug therapy: Secondary | ICD-10-CM | POA: Diagnosis not present

## 2024-01-16 DIAGNOSIS — G8220 Paraplegia, unspecified: Secondary | ICD-10-CM | POA: Insufficient documentation

## 2024-01-16 DIAGNOSIS — D500 Iron deficiency anemia secondary to blood loss (chronic): Secondary | ICD-10-CM

## 2024-01-16 DIAGNOSIS — R82998 Other abnormal findings in urine: Secondary | ICD-10-CM | POA: Insufficient documentation

## 2024-01-16 DIAGNOSIS — B954 Other streptococcus as the cause of diseases classified elsewhere: Secondary | ICD-10-CM | POA: Diagnosis not present

## 2024-01-16 DIAGNOSIS — D509 Iron deficiency anemia, unspecified: Secondary | ICD-10-CM | POA: Diagnosis not present

## 2024-01-16 DIAGNOSIS — N390 Urinary tract infection, site not specified: Secondary | ICD-10-CM | POA: Diagnosis not present

## 2024-01-16 DIAGNOSIS — R8290 Unspecified abnormal findings in urine: Secondary | ICD-10-CM | POA: Diagnosis not present

## 2024-01-16 LAB — POCT URINALYSIS DIP (MANUAL ENTRY)
Bilirubin, UA: NEGATIVE
Blood, UA: NEGATIVE
Glucose, UA: NEGATIVE mg/dL
Ketones, POC UA: NEGATIVE mg/dL
Nitrite, UA: NEGATIVE
Protein Ur, POC: NEGATIVE mg/dL
Spec Grav, UA: 1.01 (ref 1.010–1.025)
Urobilinogen, UA: 0.2 U/dL
pH, UA: 6.5 (ref 5.0–8.0)

## 2024-01-16 MED ORDER — SODIUM CHLORIDE 0.9 % IV SOLN
510.0000 mg | Freq: Once | INTRAVENOUS | Status: AC
  Administered 2024-01-16: 510 mg via INTRAVENOUS
  Filled 2024-01-16: qty 510

## 2024-01-16 MED ORDER — LORATADINE 10 MG PO TABS
10.0000 mg | ORAL_TABLET | Freq: Once | ORAL | Status: AC
  Administered 2024-01-16: 10 mg via ORAL
  Filled 2024-01-16: qty 1

## 2024-01-16 MED ORDER — SODIUM CHLORIDE 0.9 % IV SOLN: INTRAVENOUS | Status: DC

## 2024-01-16 MED ORDER — ACETAMINOPHEN 325 MG PO TABS
650.0000 mg | ORAL_TABLET | Freq: Once | ORAL | Status: AC
  Administered 2024-01-16: 650 mg via ORAL
  Filled 2024-01-16: qty 2
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
## 2024-01-16 NOTE — Patient Instructions (Signed)
 Ferumoxytol Injection What is this medication? FERUMOXYTOL (FER ue MOX i tol) treats low levels of iron in your body (iron deficiency anemia). Iron is a mineral that plays an important role in making red blood cells, which carry oxygen from your lungs to the rest of your body. This medicine may be used for other purposes; ask your health care provider or pharmacist if you have questions. COMMON BRAND NAME(S): Feraheme What should I tell my care team before I take this medication? They need to know if you have any of these conditions: Anemia not caused by low iron levels High levels of iron in the blood Magnetic resonance imaging (MRI) test scheduled An unusual or allergic reaction to iron, other medications, foods, dyes, or preservatives Pregnant or trying to get pregnant Breastfeeding How should I use this medication? This medication is injected into a vein. It is given by your care team in a hospital or clinic setting. Talk to your care team the use of this medication in children. Special care may be needed. Overdosage: If you think you have taken too much of this medicine contact a poison control center or emergency room at once. NOTE: This medicine is only for you. Do not share this medicine with others. What if I miss a dose? It is important not to miss your dose. Call your care team if you are unable to keep an appointment. What may interact with this medication? Other iron products This list may not describe all possible interactions. Give your health care provider a list of all the medicines, herbs, non-prescription drugs, or dietary supplements you use. Also tell them if you smoke, drink alcohol, or use illegal drugs. Some items may interact with your medicine. What should I watch for while using this medication? Visit your care team for regular checks on your progress. Tell your care team if your symptoms do not start to get better or if they get worse. You may need blood work done  while you are taking this medication. You may need to eat more foods that contain iron. Talk to your care team. Foods that contain iron include whole grains or cereals, dried fruits, beans, peas, leafy green vegetables, and organ meats (liver, kidney). What side effects may I notice from receiving this medication? Side effects that you should report to your care team as soon as possible: Allergic reactions--skin rash, itching, hives, swelling of the face, lips, tongue, or throat Low blood pressure--dizziness, feeling faint or lightheaded, blurry vision Shortness of breath Side effects that usually do not require medical attention (report to your care team if they continue or are bothersome): Flushing Headache Joint pain Muscle pain Nausea Pain, redness, or irritation at injection site This list may not describe all possible side effects. Call your doctor for medical advice about side effects. You may report side effects to FDA at 1-800-FDA-1088. Where should I keep my medication? This medication is given in a hospital or clinic. It will not be stored at home. NOTE: This sheet is a summary. It may not cover all possible information. If you have questions about this medicine, talk to your doctor, pharmacist, or health care provider.  2024 Elsevier/Gold Standard (2023-06-29 00:00:00)

## 2024-01-16 NOTE — Discharge Instructions (Addendum)
 The patient is not having fevers, chills, nausea, vomiting, diarrhea.  He does have some intermittent constipation that is chronic.  He is leaking urine between catheterizations and that usually only occurs when he has an acute UTI.  He also has very odorous urine.  His urinalysis is equivocal and not clearly showing infection.  Urine culture sent.  Will adjust the plan of care, once the culture results.  Discussed potentially treating him with antibiotics until the culture is available but he does not feel bad and he declined antibiotics for now.  Encouraged to get plenty of's.  Discussed that we use of catheters can be a source of infection.  He has not seen Dr. Jacquelyne Balint in several years.  Encouraged him to go see Dr. Perley Jain and get checked out and may need a cystoscopy or video urodynamics since it has been several years since he has been seen.  Encouraged to get a more consistent daily bowel regimen that would also help improve his urinary function.  Follow-up here if symptoms do not improve, worsen or new symptoms occur.
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
## 2024-01-18 ENCOUNTER — Encounter (HOSPITAL_BASED_OUTPATIENT_CLINIC_OR_DEPARTMENT_OTHER): Payer: BC Managed Care – PPO | Admitting: General Surgery

## 2024-01-18 DIAGNOSIS — L97312 Non-pressure chronic ulcer of right ankle with fat layer exposed: Secondary | ICD-10-CM | POA: Diagnosis not present

## 2024-01-18 DIAGNOSIS — L97512 Non-pressure chronic ulcer of other part of right foot with fat layer exposed: Secondary | ICD-10-CM | POA: Diagnosis not present

## 2024-01-18 DIAGNOSIS — L97322 Non-pressure chronic ulcer of left ankle with fat layer exposed: Secondary | ICD-10-CM | POA: Diagnosis not present

## 2024-01-18 DIAGNOSIS — L89323 Pressure ulcer of left buttock, stage 3: Secondary | ICD-10-CM | POA: Diagnosis not present

## 2024-01-18 DIAGNOSIS — G6289 Other specified polyneuropathies: Secondary | ICD-10-CM | POA: Diagnosis not present

## 2024-01-18 DIAGNOSIS — G8220 Paraplegia, unspecified: Secondary | ICD-10-CM | POA: Diagnosis not present

## 2024-01-18 DIAGNOSIS — I872 Venous insufficiency (chronic) (peripheral): Secondary | ICD-10-CM | POA: Diagnosis not present

## 2024-01-18 DIAGNOSIS — L97522 Non-pressure chronic ulcer of other part of left foot with fat layer exposed: Secondary | ICD-10-CM | POA: Diagnosis not present

## 2024-01-19 ENCOUNTER — Telehealth (HOSPITAL_BASED_OUTPATIENT_CLINIC_OR_DEPARTMENT_OTHER): Payer: Self-pay | Admitting: Family Medicine

## 2024-01-19 ENCOUNTER — Encounter (HOSPITAL_BASED_OUTPATIENT_CLINIC_OR_DEPARTMENT_OTHER): Payer: Self-pay | Admitting: Family Medicine

## 2024-01-19 LAB — URINE CULTURE: Culture: 30000 — AB

## 2024-01-19 MED ORDER — CEFDINIR 300 MG PO CAPS
300.0000 mg | ORAL_CAPSULE | Freq: Two times a day (BID) | ORAL | 0 refills | Status: AC
Start: 2024-01-19 — End: 2024-01-26

## 2024-01-19 NOTE — Telephone Encounter (Signed)
 Urine Culture positive for VSG.  Will treat with Cefdinir 300mg  BID x7 days.  Patient updated via VM message.

## 2024-01-19 NOTE — Progress Notes (Signed)
 Culture is positive for VSG.  I sent in Cefdinir, 300mg  twice daily for 7 days.  Patient was updated via VM message.

## 2024-02-07 ENCOUNTER — Encounter (HOSPITAL_BASED_OUTPATIENT_CLINIC_OR_DEPARTMENT_OTHER): Payer: BC Managed Care – PPO | Attending: General Surgery | Admitting: General Surgery

## 2024-02-07 DIAGNOSIS — L97322 Non-pressure chronic ulcer of left ankle with fat layer exposed: Secondary | ICD-10-CM | POA: Diagnosis not present

## 2024-02-07 DIAGNOSIS — L97512 Non-pressure chronic ulcer of other part of right foot with fat layer exposed: Secondary | ICD-10-CM | POA: Diagnosis not present

## 2024-02-07 DIAGNOSIS — L89323 Pressure ulcer of left buttock, stage 3: Secondary | ICD-10-CM | POA: Diagnosis not present

## 2024-02-07 DIAGNOSIS — L97312 Non-pressure chronic ulcer of right ankle with fat layer exposed: Secondary | ICD-10-CM | POA: Insufficient documentation

## 2024-02-07 DIAGNOSIS — G8220 Paraplegia, unspecified: Secondary | ICD-10-CM | POA: Insufficient documentation

## 2024-02-07 DIAGNOSIS — L89893 Pressure ulcer of other site, stage 3: Secondary | ICD-10-CM | POA: Diagnosis not present

## 2024-02-07 DIAGNOSIS — I872 Venous insufficiency (chronic) (peripheral): Secondary | ICD-10-CM | POA: Diagnosis not present

## 2024-02-07 DIAGNOSIS — L97522 Non-pressure chronic ulcer of other part of left foot with fat layer exposed: Secondary | ICD-10-CM | POA: Diagnosis not present

## 2024-02-07 DIAGNOSIS — L97822 Non-pressure chronic ulcer of other part of left lower leg with fat layer exposed: Secondary | ICD-10-CM | POA: Diagnosis not present

## 2024-02-17 ENCOUNTER — Other Ambulatory Visit: Payer: Self-pay | Admitting: Hematology and Oncology

## 2024-02-17 DIAGNOSIS — D509 Iron deficiency anemia, unspecified: Secondary | ICD-10-CM

## 2024-02-17 DIAGNOSIS — D500 Iron deficiency anemia secondary to blood loss (chronic): Secondary | ICD-10-CM

## 2024-02-20 ENCOUNTER — Inpatient Hospital Stay: Payer: BC Managed Care – PPO

## 2024-02-20 ENCOUNTER — Inpatient Hospital Stay: Payer: BC Managed Care – PPO | Attending: Hematology and Oncology | Admitting: Hematology and Oncology

## 2024-02-20 ENCOUNTER — Encounter: Payer: Self-pay | Admitting: Hematology and Oncology

## 2024-02-20 VITALS — BP 134/78 | HR 84 | Temp 98.0°F | Resp 18 | Ht 70.0 in

## 2024-02-20 DIAGNOSIS — D75838 Other thrombocytosis: Secondary | ICD-10-CM | POA: Insufficient documentation

## 2024-02-20 DIAGNOSIS — D500 Iron deficiency anemia secondary to blood loss (chronic): Secondary | ICD-10-CM

## 2024-02-20 DIAGNOSIS — Z79899 Other long term (current) drug therapy: Secondary | ICD-10-CM | POA: Diagnosis not present

## 2024-02-20 DIAGNOSIS — D75839 Thrombocytosis, unspecified: Secondary | ICD-10-CM | POA: Diagnosis not present

## 2024-02-20 DIAGNOSIS — D509 Iron deficiency anemia, unspecified: Secondary | ICD-10-CM

## 2024-02-20 DIAGNOSIS — R768 Other specified abnormal immunological findings in serum: Secondary | ICD-10-CM

## 2024-02-20 LAB — CBC WITH DIFFERENTIAL (CANCER CENTER ONLY)
Abs Immature Granulocytes: 0.02 10*3/uL (ref 0.00–0.07)
Basophils Absolute: 0.1 10*3/uL (ref 0.0–0.1)
Basophils Relative: 1 %
Eosinophils Absolute: 0.4 10*3/uL (ref 0.0–0.5)
Eosinophils Relative: 5 %
HCT: 35.6 % — ABNORMAL LOW (ref 39.0–52.0)
Hemoglobin: 11.3 g/dL — ABNORMAL LOW (ref 13.0–17.0)
Immature Granulocytes: 0 %
Lymphocytes Relative: 33 %
Lymphs Abs: 3.1 10*3/uL (ref 0.7–4.0)
MCH: 23.3 pg — ABNORMAL LOW (ref 26.0–34.0)
MCHC: 31.7 g/dL (ref 30.0–36.0)
MCV: 73.6 fL — ABNORMAL LOW (ref 80.0–100.0)
Monocytes Absolute: 1.1 10*3/uL — ABNORMAL HIGH (ref 0.1–1.0)
Monocytes Relative: 12 %
Neutro Abs: 4.6 10*3/uL (ref 1.7–7.7)
Neutrophils Relative %: 49 %
Platelet Count: 466 10*3/uL — ABNORMAL HIGH (ref 150–400)
RBC: 4.84 MIL/uL (ref 4.22–5.81)
RDW: 26.8 % — ABNORMAL HIGH (ref 11.5–15.5)
WBC Count: 9.3 10*3/uL (ref 4.0–10.5)
nRBC: 0 % (ref 0.0–0.2)
nRBC: 0 /100{WBCs}

## 2024-02-20 LAB — IRON AND TIBC
Iron: 36 ug/dL — ABNORMAL LOW (ref 45–182)
Saturation Ratios: 11 % — ABNORMAL LOW (ref 17.9–39.5)
TIBC: 332 ug/dL (ref 250–450)
UIBC: 296 ug/dL

## 2024-02-20 LAB — FERRITIN: Ferritin: 64 ng/mL (ref 24–336)

## 2024-02-20 NOTE — Assessment & Plan Note (Signed)
 

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
## 2024-02-20 NOTE — Assessment & Plan Note (Addendum)
 Iron deficiency anemia most likely due to chronic GI blood loss, but possibly decreased absorption.  He was referred to GI for further recommendations.  Celiac panel revealed elevated IgA, but not tissue transglutaminase Ab, IgA, so no evidence of celiac disease. His hemoglobin has improved but not normalized with IV iron. He has persistent microcytosis. Iron studies are pending from today. I will plan follow up pending the results. I encouraged him to follow up with GI.

## 2024-02-20 NOTE — Assessment & Plan Note (Signed)
 Elevated IgA. SPEP and quantitative immunoglobulins for further evaluation are pending from today.

## 2024-02-21 ENCOUNTER — Telehealth: Payer: Self-pay | Admitting: Hematology and Oncology

## 2024-02-21 LAB — KAPPA/LAMBDA LIGHT CHAINS
Kappa free light chain: 42.4 mg/L — ABNORMAL HIGH (ref 3.3–19.4)
Kappa, lambda light chain ratio: 1.3 (ref 0.26–1.65)
Lambda free light chains: 32.6 mg/L — ABNORMAL HIGH (ref 5.7–26.3)

## 2024-02-21 NOTE — Telephone Encounter (Signed)
 02/21/24 LVM appt schedule on 06/22/24@830am 

## 2024-02-23 ENCOUNTER — Encounter: Payer: Self-pay | Admitting: Hematology and Oncology

## 2024-02-23 ENCOUNTER — Telehealth: Payer: Self-pay

## 2024-02-23 NOTE — Telephone Encounter (Signed)
 Left voicemail for patient to return call.

## 2024-02-23 NOTE — Telephone Encounter (Signed)
-----   Message from Adah Perl sent at 02/21/2024  8:34 AM EDT ----- Please let him know his iron stores are much better. Would he try an oral iron supplement daily,? If so, I can see him back in 3 months-scheduler will call him. Thanks

## 2024-02-24 LAB — MULTIPLE MYELOMA PANEL, SERUM
Albumin SerPl Elph-Mcnc: 3.3 g/dL (ref 2.9–4.4)
Albumin/Glob SerPl: 0.8 (ref 0.7–1.7)
Alpha 1: 0.3 g/dL (ref 0.0–0.4)
Alpha2 Glob SerPl Elph-Mcnc: 1 g/dL (ref 0.4–1.0)
B-Globulin SerPl Elph-Mcnc: 1.5 g/dL — ABNORMAL HIGH (ref 0.7–1.3)
Gamma Glob SerPl Elph-Mcnc: 1.8 g/dL (ref 0.4–1.8)
Globulin, Total: 4.6 g/dL — ABNORMAL HIGH (ref 2.2–3.9)
IgA: 731 mg/dL — ABNORMAL HIGH (ref 90–386)
IgG (Immunoglobin G), Serum: 1871 mg/dL — ABNORMAL HIGH (ref 603–1613)
IgM (Immunoglobulin M), Srm: 44 mg/dL (ref 20–172)
Total Protein ELP: 7.9 g/dL (ref 6.0–8.5)

## 2024-02-28 ENCOUNTER — Encounter (HOSPITAL_BASED_OUTPATIENT_CLINIC_OR_DEPARTMENT_OTHER): Attending: General Surgery | Admitting: General Surgery

## 2024-02-28 DIAGNOSIS — L97512 Non-pressure chronic ulcer of other part of right foot with fat layer exposed: Secondary | ICD-10-CM | POA: Insufficient documentation

## 2024-02-28 DIAGNOSIS — L97522 Non-pressure chronic ulcer of other part of left foot with fat layer exposed: Secondary | ICD-10-CM | POA: Diagnosis not present

## 2024-02-28 DIAGNOSIS — L89323 Pressure ulcer of left buttock, stage 3: Secondary | ICD-10-CM | POA: Insufficient documentation

## 2024-02-28 DIAGNOSIS — L97322 Non-pressure chronic ulcer of left ankle with fat layer exposed: Secondary | ICD-10-CM | POA: Diagnosis not present

## 2024-02-28 DIAGNOSIS — L97822 Non-pressure chronic ulcer of other part of left lower leg with fat layer exposed: Secondary | ICD-10-CM | POA: Diagnosis not present

## 2024-02-29 ENCOUNTER — Encounter: Payer: Self-pay | Admitting: Hematology and Oncology

## 2024-02-29 NOTE — Telephone Encounter (Signed)
 Patient has not returned phone call. My Chart message sent to patient.

## 2024-03-21 ENCOUNTER — Ambulatory Visit (HOSPITAL_BASED_OUTPATIENT_CLINIC_OR_DEPARTMENT_OTHER): Admitting: General Surgery

## 2024-03-27 ENCOUNTER — Encounter: Payer: Self-pay | Admitting: Hematology and Oncology

## 2024-03-27 ENCOUNTER — Encounter (HOSPITAL_BASED_OUTPATIENT_CLINIC_OR_DEPARTMENT_OTHER): Attending: General Surgery | Admitting: General Surgery

## 2024-03-27 DIAGNOSIS — L97818 Non-pressure chronic ulcer of other part of right lower leg with other specified severity: Secondary | ICD-10-CM | POA: Diagnosis not present

## 2024-03-27 DIAGNOSIS — L97522 Non-pressure chronic ulcer of other part of left foot with fat layer exposed: Secondary | ICD-10-CM | POA: Insufficient documentation

## 2024-03-27 DIAGNOSIS — L97312 Non-pressure chronic ulcer of right ankle with fat layer exposed: Secondary | ICD-10-CM | POA: Insufficient documentation

## 2024-03-27 DIAGNOSIS — L97322 Non-pressure chronic ulcer of left ankle with fat layer exposed: Secondary | ICD-10-CM | POA: Diagnosis not present

## 2024-03-27 DIAGNOSIS — L97512 Non-pressure chronic ulcer of other part of right foot with fat layer exposed: Secondary | ICD-10-CM | POA: Insufficient documentation

## 2024-03-27 DIAGNOSIS — G8220 Paraplegia, unspecified: Secondary | ICD-10-CM | POA: Diagnosis not present

## 2024-04-24 ENCOUNTER — Encounter (HOSPITAL_BASED_OUTPATIENT_CLINIC_OR_DEPARTMENT_OTHER): Attending: General Surgery | Admitting: General Surgery

## 2024-04-24 DIAGNOSIS — L97522 Non-pressure chronic ulcer of other part of left foot with fat layer exposed: Secondary | ICD-10-CM | POA: Insufficient documentation

## 2024-04-24 DIAGNOSIS — L89323 Pressure ulcer of left buttock, stage 3: Secondary | ICD-10-CM | POA: Insufficient documentation

## 2024-04-24 DIAGNOSIS — L97822 Non-pressure chronic ulcer of other part of left lower leg with fat layer exposed: Secondary | ICD-10-CM | POA: Diagnosis not present

## 2024-04-24 DIAGNOSIS — G8221 Paraplegia, complete: Secondary | ICD-10-CM | POA: Insufficient documentation

## 2024-04-24 DIAGNOSIS — L97312 Non-pressure chronic ulcer of right ankle with fat layer exposed: Secondary | ICD-10-CM | POA: Diagnosis not present

## 2024-04-24 DIAGNOSIS — L97322 Non-pressure chronic ulcer of left ankle with fat layer exposed: Secondary | ICD-10-CM | POA: Insufficient documentation

## 2024-04-24 DIAGNOSIS — L97512 Non-pressure chronic ulcer of other part of right foot with fat layer exposed: Secondary | ICD-10-CM | POA: Insufficient documentation

## 2024-04-24 DIAGNOSIS — L97818 Non-pressure chronic ulcer of other part of right lower leg with other specified severity: Secondary | ICD-10-CM | POA: Diagnosis not present

## 2024-05-15 ENCOUNTER — Encounter (HOSPITAL_BASED_OUTPATIENT_CLINIC_OR_DEPARTMENT_OTHER): Admitting: General Surgery

## 2024-05-15 DIAGNOSIS — L97522 Non-pressure chronic ulcer of other part of left foot with fat layer exposed: Secondary | ICD-10-CM | POA: Diagnosis not present

## 2024-05-22 ENCOUNTER — Other Ambulatory Visit: Payer: Self-pay | Admitting: Hematology and Oncology

## 2024-05-22 DIAGNOSIS — D500 Iron deficiency anemia secondary to blood loss (chronic): Secondary | ICD-10-CM
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
## 2024-05-22 NOTE — Assessment & Plan Note (Signed)
 Found on screening for celiac disease.  SPEP revealed polyclonal increase. Both IgA and IgG were elevated.

## 2024-05-22 NOTE — Assessment & Plan Note (Signed)
 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

## 2024-05-22 NOTE — Assessment & Plan Note (Signed)
 Iron deficiency anemia most likely due to chronic GI blood loss, but possibly decreased absorption.  He was referred to GI for further recommendations.  Celiac panel revealed elevated IgA, but not tissue transglutaminase Ab, IgA, so no evidence of celiac disease.  SPEP did not reveal a monoclonal gammopathy.

## 2024-05-23 ENCOUNTER — Encounter: Payer: Self-pay | Admitting: Hematology and Oncology

## 2024-05-23 ENCOUNTER — Inpatient Hospital Stay (HOSPITAL_BASED_OUTPATIENT_CLINIC_OR_DEPARTMENT_OTHER): Admitting: Hematology and Oncology

## 2024-05-23 ENCOUNTER — Inpatient Hospital Stay: Attending: Hematology and Oncology

## 2024-05-23 ENCOUNTER — Telehealth: Payer: Self-pay | Admitting: Hematology and Oncology

## 2024-05-23 VITALS — BP 124/85 | HR 91 | Temp 98.9°F | Resp 20 | Ht 70.0 in | Wt 240.0 lb

## 2024-05-23 DIAGNOSIS — D500 Iron deficiency anemia secondary to blood loss (chronic): Secondary | ICD-10-CM | POA: Insufficient documentation

## 2024-05-23 DIAGNOSIS — D72829 Elevated white blood cell count, unspecified: Secondary | ICD-10-CM | POA: Diagnosis not present

## 2024-05-23 DIAGNOSIS — R768 Other specified abnormal immunological findings in serum: Secondary | ICD-10-CM | POA: Insufficient documentation

## 2024-05-23 DIAGNOSIS — Z79899 Other long term (current) drug therapy: Secondary | ICD-10-CM | POA: Insufficient documentation

## 2024-05-23 DIAGNOSIS — Z87891 Personal history of nicotine dependence: Secondary | ICD-10-CM | POA: Insufficient documentation

## 2024-05-23 DIAGNOSIS — Z86718 Personal history of other venous thrombosis and embolism: Secondary | ICD-10-CM | POA: Insufficient documentation

## 2024-05-23 DIAGNOSIS — R102 Pelvic and perineal pain: Secondary | ICD-10-CM | POA: Diagnosis not present

## 2024-05-23 DIAGNOSIS — D75838 Other thrombocytosis: Secondary | ICD-10-CM | POA: Diagnosis not present

## 2024-05-23 DIAGNOSIS — D75839 Thrombocytosis, unspecified: Secondary | ICD-10-CM

## 2024-05-23 LAB — CBC WITH DIFFERENTIAL (CANCER CENTER ONLY)
Abs Immature Granulocytes: 0.03 10*3/uL (ref 0.00–0.07)
Basophils Absolute: 0.1 10*3/uL (ref 0.0–0.1)
Basophils Relative: 1 %
Eosinophils Absolute: 0.4 10*3/uL (ref 0.0–0.5)
Eosinophils Relative: 4 %
HCT: 35.3 % — ABNORMAL LOW (ref 39.0–52.0)
Hemoglobin: 11.8 g/dL — ABNORMAL LOW (ref 13.0–17.0)
Immature Granulocytes: 0 %
Lymphocytes Relative: 24 %
Lymphs Abs: 2.8 10*3/uL (ref 0.7–4.0)
MCH: 25.8 pg — ABNORMAL LOW (ref 26.0–34.0)
MCHC: 33.4 g/dL (ref 30.0–36.0)
MCV: 77.1 fL — ABNORMAL LOW (ref 80.0–100.0)
Monocytes Absolute: 1.4 10*3/uL — ABNORMAL HIGH (ref 0.1–1.0)
Monocytes Relative: 12 %
Neutro Abs: 7 10*3/uL (ref 1.7–7.7)
Neutrophils Relative %: 59 %
Platelet Count: 453 10*3/uL — ABNORMAL HIGH (ref 150–400)
RBC: 4.58 MIL/uL (ref 4.22–5.81)
RDW: 16.7 % — ABNORMAL HIGH (ref 11.5–15.5)
WBC Count: 11.8 10*3/uL — ABNORMAL HIGH (ref 4.0–10.5)
nRBC: 0 % (ref 0.0–0.2)

## 2024-05-23 LAB — URINALYSIS, COMPLETE (UACMP) WITH MICROSCOPIC
Bacteria, UA: NONE SEEN
Bilirubin Urine: NEGATIVE
Glucose, UA: NEGATIVE mg/dL
Hgb urine dipstick: NEGATIVE
Ketones, ur: NEGATIVE mg/dL
Nitrite: NEGATIVE
Protein, ur: NEGATIVE mg/dL
Specific Gravity, Urine: 1.005 (ref 1.005–1.030)
pH: 6 (ref 5.0–8.0)

## 2024-05-23 LAB — CMP (CANCER CENTER ONLY)
ALT: 24 U/L (ref 0–44)
AST: 21 U/L (ref 15–41)
Albumin: 4 g/dL (ref 3.5–5.0)
Alkaline Phosphatase: 108 U/L (ref 38–126)
Anion gap: 12 (ref 5–15)
BUN: 10 mg/dL (ref 6–20)
CO2: 26 mmol/L (ref 22–32)
Calcium: 8.9 mg/dL (ref 8.9–10.3)
Chloride: 100 mmol/L (ref 98–111)
Creatinine: 0.67 mg/dL (ref 0.61–1.24)
GFR, Estimated: >60 mL/min (ref >60)
Glucose, Bld: 94 mg/dL (ref 70–99)
Potassium: 3.8 mmol/L (ref 3.5–5.1)
Sodium: 137 mmol/L (ref 135–145)
Total Bilirubin: 0.3 mg/dL (ref 0.0–1.2)
Total Protein: 8.4 g/dL — ABNORMAL HIGH (ref 6.5–8.1)

## 2024-05-23 LAB — FERRITIN: Ferritin: 29 ng/mL (ref 24–336)

## 2024-05-23 LAB — IRON AND TIBC
Iron: 32 ug/dL — ABNORMAL LOW (ref 45–182)
Saturation Ratios: 9 % — ABNORMAL LOW (ref 17.9–39.5)
TIBC: 365 ug/dL (ref 250–450)
UIBC: 333 ug/dL

## 2024-05-23 NOTE — Assessment & Plan Note (Signed)
 He feels he may have a UTI due to mild suprapubic discomfort.  This is related to his need for self cath.  He is using over-the-counter urinary tract formulas.  He usually goes to urgent care if the symptoms are severe.  He has leukocytosis today, so we will obtain a urinalysis and urine culture.

## 2024-05-23 NOTE — Telephone Encounter (Signed)
 Patient has been scheduled for follow-up visit per 05/23/24 LOS.  Pt given an appt calendar with date and time.

## 2024-05-24 ENCOUNTER — Encounter: Payer: Self-pay | Admitting: Hematology and Oncology

## 2024-05-24 ENCOUNTER — Telehealth: Payer: Self-pay

## 2024-05-24 ENCOUNTER — Other Ambulatory Visit: Payer: Self-pay | Admitting: Hematology and Oncology

## 2024-05-24 LAB — URINE CULTURE: Culture: NO GROWTH

## 2024-05-24 NOTE — Telephone Encounter (Signed)
 Detailed message left for patient , sch will call him to sch after iron approved by new insurance . This will be 5 doses instead of 2 do to his new insurance .

## 2024-05-24 NOTE — Telephone Encounter (Signed)
 Detailed message left for patient.

## 2024-05-24 NOTE — Telephone Encounter (Signed)
-----   Message from Andrez DELENA Foy sent at 05/24/2024 12:45 PM EDT ----- So sorry results of this morning, tell him his urine is negative.  Thanks

## 2024-05-24 NOTE — Telephone Encounter (Signed)
-----   Message from Andrez DELENA Foy sent at 05/24/2024  8:22 AM EDT ----- Please let him know his iron is low again, so recommend IV iron.  Encouraged him to call Dr. Larene.  Since we are giving iron again, we can see him back in 3 months. Thanks ----- Message ----- From: Foy Andrez DELENA, PA-C Sent: 05/24/2024   8:22 AM EDT To: Bruno LITTIE Norlander, LPN  Please let him know his iron is low again, so recommend IV iron. Please have the schedulers call him to schedule. Thanks

## 2024-05-28 ENCOUNTER — Encounter: Payer: Self-pay | Admitting: Hematology and Oncology

## 2024-05-29 ENCOUNTER — Ambulatory Visit: Payer: BC Managed Care – PPO | Admitting: Behavioral Health

## 2024-05-29 ENCOUNTER — Encounter: Payer: Self-pay | Admitting: Hematology and Oncology

## 2024-05-29 ENCOUNTER — Encounter: Payer: Self-pay | Admitting: Behavioral Health

## 2024-05-29 DIAGNOSIS — F341 Dysthymic disorder: Secondary | ICD-10-CM

## 2024-05-29 MED ORDER — SERTRALINE HCL 100 MG PO TABS: ORAL_TABLET | ORAL | 1 refills | Status: DC

## 2024-05-29 NOTE — Progress Notes (Signed)
 [facility]  Patient ID: [name],  MRN: [id number]  PCP: [name] Pock, PA-C  Date of Evaluation: 05/29/2024 Time spent:20 minutes  Chief Complaint:  Chief Complaint   Depression; Anxiety; Follow-up; Medication Refill; Patient Education     HISTORY/CURRENT STATUS: HPI  [name], 38 year old male presents to this office for follow up and medication management. No psychosocial changes this visit. Says everything is going well. He is requesting no medication changes  this visit and would like to continue to monitor. He reports 1/10 depression, and 0/10 anxiety. He sleeps 7 plus hours per night. Denies any significant social changes. He is wheelchair bound and has non healing wounds on both legs bilaterally. He goes to wound care once weekly. No changes in his social life. He denies mania, no psychosis. No SI/HI.    No prior psychiatric medication failures     Individual Medical History/ Review of Systems: Changes? :No   Allergies: Sulfa antibiotics, Levofloxacin, Meropenem , Penicillins, and Zyvox [linezolid]  Current Medications:  Current Outpatient Medications:    Amino Acids-Protein Hydrolys (FEEDING SUPPLEMENT, PRO-STAT SUGAR FREE 64,) LIQD, Take 30 mLs by mouth 2 (two) times daily., Disp: , Rfl:    Ascorbic Acid (VITAMIN C) 1000 MG tablet, Take 1,000 mg by mouth daily., Disp: , Rfl:    baclofen  (LIORESAL ) 20 MG tablet, Take 40-60 mg by mouth 3 (three) times daily. 60mg  in the morning, 40mg  in the afternoon, and 60mg  in the evening, Disp: , Rfl:    Ferrous Sulfate (IRON PO), Take 1 tablet by mouth daily., Disp: , Rfl:    imipramine  (TOFRANIL ) 50 MG tablet, Take 50 mg by mouth at bedtime., Disp: , Rfl:    ketoconazole (NIZORAL) 2 % cream, Apply 1 Application topically daily as needed., Disp: , Rfl:    lisinopril  (ZESTRIL ) 10 MG tablet, Take 10 mg by mouth daily., Disp: , Rfl:    loratadine  (CLARITIN ) 10 MG tablet, Take 10 mg by mouth daily. (Patient not taking:  Reported on 05/23/2024), Disp: , Rfl:    Melatonin 10 MG CAPS, Take by mouth. Nightly, Disp: , Rfl:    Multiple Vitamin (MULTIVITAMIN) tablet, Take 1 tablet by mouth daily., Disp: , Rfl:    mupirocin  ointment (BACTROBAN ) 2 %, Apply topically daily., Disp: , Rfl:    oxybutynin  (DITROPAN  XL) 15 MG 24 hr tablet, Take 15 mg by mouth at bedtime. 2 tabs, Disp: , Rfl:    sertraline  (ZOLOFT ) 100 MG tablet, TAKE 1 AND 1/2 TABLETS(150 MG) BY MOUTH DAILY, Disp: 135 tablet, Rfl: 1   zinc sulfate 220 (50 Zn) MG capsule, Take 220 mg by mouth daily., Disp: , Rfl:  Medication Side Effects: none  Family Medical/ Social History: Changes? No  MENTAL HEALTH EXAM:  There were no vitals taken for this visit.There is no height or weight on file to calculate BMI.  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  He is smiling and upbeat. No social changes. He continues to enjoy great stability. Has strong family support.  He is very happy with how the increase of Zoloft  has been working to reduce his anxiety and depression.  Will continue Zoloft  to 150 mg  daily.  Will report worsening  symptoms promptly Will follow up in 6 months to reassess Provided emergency contact information Reviewed PDMP    Redell DELENA Pizza, NP

## 2024-05-31 ENCOUNTER — Telehealth: Payer: Self-pay | Admitting: Hematology and Oncology

## 2024-05-31 NOTE — Telephone Encounter (Signed)
-----   Message from Nurse Bruno E sent at 05/28/2024 12:53 PM EDT ----- I left him a detailed message last week letting him know it was be a different  brand medication and more visits do to his insurance. ----- Message ----- From: Clement Renshaw Sent: 05/28/2024   9:02 AM EDT To: Bruno LITTIE Norlander, LPN  Good morning,  Have you spoken to pt about IV Iron needs? Per Arland it will actually be Venofer that his insurance covers..  Wanted to make sure before contacting him. ----- Message ----- From: [place] Andrez LABOR, PA-C Sent: 05/24/2024   8:29 AM EDT To: Damaris Figueroa; Karna Raspberry  [place] will call him to let him know he needs IV Feraheme again. Please schedule x 2. Since he is receiving iron now, he can be seen in 3 months, instead of 6 weeks. Thanks

## 2024-05-31 NOTE — Telephone Encounter (Signed)
 Contacted pt to schedule an appt. Unable to reach via phone, voicemail was left.

## 2024-06-04 ENCOUNTER — Encounter (HOSPITAL_BASED_OUTPATIENT_CLINIC_OR_DEPARTMENT_OTHER): Attending: General Surgery | Admitting: General Surgery

## 2024-06-04 DIAGNOSIS — L89323 Pressure ulcer of left buttock, stage 3: Secondary | ICD-10-CM | POA: Insufficient documentation

## 2024-06-04 DIAGNOSIS — L97818 Non-pressure chronic ulcer of other part of right lower leg with other specified severity: Secondary | ICD-10-CM | POA: Insufficient documentation

## 2024-06-04 DIAGNOSIS — L97522 Non-pressure chronic ulcer of other part of left foot with fat layer exposed: Secondary | ICD-10-CM | POA: Diagnosis present

## 2024-06-04 DIAGNOSIS — L97322 Non-pressure chronic ulcer of left ankle with fat layer exposed: Secondary | ICD-10-CM | POA: Insufficient documentation

## 2024-06-04 DIAGNOSIS — L97312 Non-pressure chronic ulcer of right ankle with fat layer exposed: Secondary | ICD-10-CM | POA: Insufficient documentation

## 2024-06-04 DIAGNOSIS — L97822 Non-pressure chronic ulcer of other part of left lower leg with fat layer exposed: Secondary | ICD-10-CM | POA: Diagnosis not present

## 2024-06-04 DIAGNOSIS — L97512 Non-pressure chronic ulcer of other part of right foot with fat layer exposed: Secondary | ICD-10-CM | POA: Diagnosis not present

## 2024-06-04 DIAGNOSIS — G8221 Paraplegia, complete: Secondary | ICD-10-CM | POA: Insufficient documentation

## 2024-06-04 NOTE — Telephone Encounter (Signed)
 Patient has been scheduled. Aware of appt date and time.

## 2024-06-05 ENCOUNTER — Ambulatory Visit

## 2024-06-05 VITALS — BP 121/76 | HR 94 | Temp 98.4°F | Resp 20

## 2024-06-05 DIAGNOSIS — D500 Iron deficiency anemia secondary to blood loss (chronic): Secondary | ICD-10-CM | POA: Diagnosis not present

## 2024-06-05 MED ORDER — SODIUM CHLORIDE 0.9% FLUSH
10.0000 mL | Freq: Once | INTRAVENOUS | Status: AC | PRN
  Administered 2024-06-05: 10 mL

## 2024-06-05 MED ORDER — ACETAMINOPHEN 325 MG PO TABS: 650.0000 mg | ORAL_TABLET | Freq: Once | ORAL | Status: DC

## 2024-06-05 MED ORDER — IRON SUCROSE 20 MG/ML IV SOLN
200.0000 mg | Freq: Once | INTRAVENOUS | Status: AC
  Administered 2024-06-05: 200 mg via INTRAVENOUS
  Filled 2024-06-05: qty 10

## 2024-06-05 MED ORDER — LORATADINE 10 MG PO TABS: 10.0000 mg | ORAL_TABLET | Freq: Every day | ORAL | Status: DC

## 2024-06-05 NOTE — Patient Instructions (Signed)
 Iron Sucrose Injection What is this medication? IRON SUCROSE (EYE ern SOO krose) treats low levels of iron (iron deficiency anemia) in people with kidney disease. Iron is a mineral that plays an important role in making red blood cells, which carry oxygen from your lungs to the rest of your body. This medicine may be used for other purposes; ask your health care provider or pharmacist if you have questions. COMMON BRAND NAME(S): Venofer What should I tell my care team before I take this medication? They need to know if you have any of these conditions: Anemia not caused by low iron levels Heart disease High levels of iron in the blood Kidney disease Liver disease An unusual or allergic reaction to iron, other medications, foods, dyes, or preservatives Pregnant or trying to get pregnant Breastfeeding How should I use this medication? This medication is infused into a vein. It is given by your care team in a hospital or clinic setting. Talk to your care team about the use of this medication in children. While it may be prescribed for children as young as 2 years for selected conditions, precautions do apply. Overdosage: If you think you have taken too much of this medicine contact a poison control center or emergency room at once. NOTE: This medicine is only for you. Do not share this medicine with others. What if I miss a dose? Keep appointments for follow-up doses. It is important not to miss your dose. Call your care team if you are unable to keep an appointment. What may interact with this medication? Do not take this medication with any of the following: Deferoxamine Dimercaprol Other iron products This medication may also interact with the following: Chloramphenicol Deferasirox This list may not describe all possible interactions. Give your health care provider a list of all the medicines, herbs, non-prescription drugs, or dietary supplements you use. Also tell them if you smoke,  drink alcohol, or use illegal drugs. Some items may interact with your medicine. What should I watch for while using this medication? Visit your care team for regular checks on your progress. Tell your care team if your symptoms do not start to get better or if they get worse. You may need blood work done while you are taking this medication. You may need to eat more foods that contain iron. Talk to your care team. Foods that contain iron include whole grains or cereals, dried fruits, beans, peas, leafy green vegetables, and organ meats (liver, kidney). What side effects may I notice from receiving this medication? Side effects that you should report to your care team as soon as possible: Allergic reactions--skin rash, itching, hives, swelling of the face, lips, tongue, or throat Low blood pressure--dizziness, feeling faint or lightheaded, blurry vision Shortness of breath Side effects that usually do not require medical attention (report to your care team if they continue or are bothersome): Flushing Headache Joint pain Muscle pain Nausea Pain, redness, or irritation at injection site This list may not describe all possible side effects. Call your doctor for medical advice about side effects. You may report side effects to FDA at 1-800-FDA-1088. Where should I keep my medication? This medication is given in a hospital or clinic. It will not be stored at home. NOTE: This sheet is a summary. It may not cover all possible information. If you have questions about this medicine, talk to your doctor, pharmacist, or health care provider.  2024 Elsevier/Gold Standard (2023-06-29 00:00:00)

## 2024-06-07 ENCOUNTER — Inpatient Hospital Stay

## 2024-06-07 VITALS — BP 114/77 | HR 83 | Temp 98.0°F | Resp 18

## 2024-06-07 DIAGNOSIS — D500 Iron deficiency anemia secondary to blood loss (chronic): Secondary | ICD-10-CM

## 2024-06-07 MED ORDER — IRON SUCROSE 20 MG/ML IV SOLN
200.0000 mg | Freq: Once | INTRAVENOUS | Status: AC
  Administered 2024-06-07: 200 mg via INTRAVENOUS
  Filled 2024-06-07: qty 10

## 2024-06-07 MED ORDER — LORATADINE 10 MG PO TABS: 10.0000 mg | ORAL_TABLET | Freq: Every day | ORAL | Status: DC

## 2024-06-07 MED ORDER — SODIUM CHLORIDE 0.9% FLUSH
10.0000 mL | Freq: Once | INTRAVENOUS | Status: AC | PRN
  Administered 2024-06-07: 10 mL

## 2024-06-07 MED ORDER — ACETAMINOPHEN 325 MG PO TABS: 650.0000 mg | ORAL_TABLET | Freq: Once | ORAL | Status: DC

## 2024-06-07 NOTE — Patient Instructions (Signed)
 Iron Sucrose Injection What is this medication? IRON SUCROSE (EYE ern SOO krose) treats low levels of iron (iron deficiency anemia) in people with kidney disease. Iron is a mineral that plays an important role in making red blood cells, which carry oxygen from your lungs to the rest of your body. This medicine may be used for other purposes; ask your health care provider or pharmacist if you have questions. COMMON BRAND NAME(S): Venofer What should I tell my care team before I take this medication? They need to know if you have any of these conditions: Anemia not caused by low iron levels Heart disease High levels of iron in the blood Kidney disease Liver disease An unusual or allergic reaction to iron, other medications, foods, dyes, or preservatives Pregnant or trying to get pregnant Breastfeeding How should I use this medication? This medication is infused into a vein. It is given by your care team in a hospital or clinic setting. Talk to your care team about the use of this medication in children. While it may be prescribed for children as young as 2 years for selected conditions, precautions do apply. Overdosage: If you think you have taken too much of this medicine contact a poison control center or emergency room at once. NOTE: This medicine is only for you. Do not share this medicine with others. What if I miss a dose? Keep appointments for follow-up doses. It is important not to miss your dose. Call your care team if you are unable to keep an appointment. What may interact with this medication? Do not take this medication with any of the following: Deferoxamine Dimercaprol Other iron products This medication may also interact with the following: Chloramphenicol Deferasirox This list may not describe all possible interactions. Give your health care provider a list of all the medicines, herbs, non-prescription drugs, or dietary supplements you use. Also tell them if you smoke,  drink alcohol, or use illegal drugs. Some items may interact with your medicine. What should I watch for while using this medication? Visit your care team for regular checks on your progress. Tell your care team if your symptoms do not start to get better or if they get worse. You may need blood work done while you are taking this medication. You may need to eat more foods that contain iron. Talk to your care team. Foods that contain iron include whole grains or cereals, dried fruits, beans, peas, leafy green vegetables, and organ meats (liver, kidney). What side effects may I notice from receiving this medication? Side effects that you should report to your care team as soon as possible: Allergic reactions--skin rash, itching, hives, swelling of the face, lips, tongue, or throat Low blood pressure--dizziness, feeling faint or lightheaded, blurry vision Shortness of breath Side effects that usually do not require medical attention (report to your care team if they continue or are bothersome): Flushing Headache Joint pain Muscle pain Nausea Pain, redness, or irritation at injection site This list may not describe all possible side effects. Call your doctor for medical advice about side effects. You may report side effects to FDA at 1-800-FDA-1088. Where should I keep my medication? This medication is given in a hospital or clinic. It will not be stored at home. NOTE: This sheet is a summary. It may not cover all possible information. If you have questions about this medicine, talk to your doctor, pharmacist, or health care provider.  2024 Elsevier/Gold Standard (2023-06-29 00:00:00)

## 2024-06-11 ENCOUNTER — Inpatient Hospital Stay

## 2024-06-11 VITALS — BP 124/71 | HR 94 | Temp 98.6°F | Resp 20

## 2024-06-11 DIAGNOSIS — D500 Iron deficiency anemia secondary to blood loss (chronic): Secondary | ICD-10-CM

## 2024-06-11 MED ORDER — ACETAMINOPHEN 325 MG PO TABS: 650.0000 mg | ORAL_TABLET | Freq: Once | ORAL | Status: DC

## 2024-06-11 MED ORDER — IRON SUCROSE 20 MG/ML IV SOLN
200.0000 mg | Freq: Once | INTRAVENOUS | Status: AC
  Administered 2024-06-11: 200 mg via INTRAVENOUS
  Filled 2024-06-11: qty 10

## 2024-06-11 MED ORDER — LORATADINE 10 MG PO TABS: 10.0000 mg | ORAL_TABLET | Freq: Every day | ORAL | Status: DC

## 2024-06-11 NOTE — Patient Instructions (Signed)
 Iron Sucrose Injection What is this medication? IRON SUCROSE (EYE ern SOO krose) treats low levels of iron (iron deficiency anemia) in people with kidney disease. Iron is a mineral that plays an important role in making red blood cells, which carry oxygen from your lungs to the rest of your body. This medicine may be used for other purposes; ask your health care provider or pharmacist if you have questions. COMMON BRAND NAME(S): Venofer What should I tell my care team before I take this medication? They need to know if you have any of these conditions: Anemia not caused by low iron levels Heart disease High levels of iron in the blood Kidney disease Liver disease An unusual or allergic reaction to iron, other medications, foods, dyes, or preservatives Pregnant or trying to get pregnant Breastfeeding How should I use this medication? This medication is infused into a vein. It is given by your care team in a hospital or clinic setting. Talk to your care team about the use of this medication in children. While it may be prescribed for children as young as 2 years for selected conditions, precautions do apply. Overdosage: If you think you have taken too much of this medicine contact a poison control center or emergency room at once. NOTE: This medicine is only for you. Do not share this medicine with others. What if I miss a dose? Keep appointments for follow-up doses. It is important not to miss your dose. Call your care team if you are unable to keep an appointment. What may interact with this medication? Do not take this medication with any of the following: Deferoxamine Dimercaprol Other iron products This medication may also interact with the following: Chloramphenicol Deferasirox This list may not describe all possible interactions. Give your health care provider a list of all the medicines, herbs, non-prescription drugs, or dietary supplements you use. Also tell them if you smoke,  drink alcohol, or use illegal drugs. Some items may interact with your medicine. What should I watch for while using this medication? Visit your care team for regular checks on your progress. Tell your care team if your symptoms do not start to get better or if they get worse. You may need blood work done while you are taking this medication. You may need to eat more foods that contain iron. Talk to your care team. Foods that contain iron include whole grains or cereals, dried fruits, beans, peas, leafy green vegetables, and organ meats (liver, kidney). What side effects may I notice from receiving this medication? Side effects that you should report to your care team as soon as possible: Allergic reactions--skin rash, itching, hives, swelling of the face, lips, tongue, or throat Low blood pressure--dizziness, feeling faint or lightheaded, blurry vision Shortness of breath Side effects that usually do not require medical attention (report to your care team if they continue or are bothersome): Flushing Headache Joint pain Muscle pain Nausea Pain, redness, or irritation at injection site This list may not describe all possible side effects. Call your doctor for medical advice about side effects. You may report side effects to FDA at 1-800-FDA-1088. Where should I keep my medication? This medication is given in a hospital or clinic. It will not be stored at home. NOTE: This sheet is a summary. It may not cover all possible information. If you have questions about this medicine, talk to your doctor, pharmacist, or health care provider.  2024 Elsevier/Gold Standard (2023-06-29 00:00:00)

## 2024-06-13 ENCOUNTER — Inpatient Hospital Stay

## 2024-06-13 VITALS — BP 125/74 | HR 91 | Temp 98.4°F | Resp 20

## 2024-06-13 DIAGNOSIS — D500 Iron deficiency anemia secondary to blood loss (chronic): Secondary | ICD-10-CM | POA: Diagnosis not present

## 2024-06-13 MED ORDER — LORATADINE 10 MG PO TABS: 10.0000 mg | ORAL_TABLET | Freq: Every day | ORAL | Status: DC

## 2024-06-13 MED ORDER — ACETAMINOPHEN 325 MG PO TABS: 650.0000 mg | ORAL_TABLET | Freq: Once | ORAL | Status: DC

## 2024-06-13 MED ORDER — IRON SUCROSE 20 MG/ML IV SOLN
200.0000 mg | Freq: Once | INTRAVENOUS | Status: AC
  Administered 2024-06-13: 200 mg via INTRAVENOUS
  Filled 2024-06-13: qty 10

## 2024-06-13 MED ORDER — SODIUM CHLORIDE 0.9% FLUSH
10.0000 mL | Freq: Once | INTRAVENOUS | Status: AC | PRN
  Administered 2024-06-13: 10 mL

## 2024-06-13 NOTE — Patient Instructions (Signed)
 Venofer Iron Sucrose Injection What is this medication? IRON SUCROSE (EYE ern SOO krose) treats low levels of iron (iron deficiency anemia) in people with kidney disease. Iron is a mineral that plays an important role in making red blood cells, which carry oxygen from your lungs to the rest of your body. This medicine may be used for other purposes; ask your health care provider or pharmacist if you have questions. COMMON BRAND NAME(S): Venofer What should I tell my care team before I take this medication? They need to know if you have any of these conditions: Anemia not caused by low iron levels Heart disease High levels of iron in the blood Kidney disease Liver disease An unusual or allergic reaction to iron, other medications, foods, dyes, or preservatives Pregnant or trying to get pregnant Breastfeeding How should I use this medication? This medication is infused into a vein. It is given by your care team in a hospital or clinic setting. Talk to your care team about the use of this medication in children. While it may be prescribed for children as young as 2 years for selected conditions, precautions do apply. Overdosage: If you think you have taken too much of this medicine contact a poison control center or emergency room at once. NOTE: This medicine is only for you. Do not share this medicine with others. What if I miss a dose? Keep appointments for follow-up doses. It is important not to miss your dose. Call your care team if you are unable to keep an appointment. What may interact with this medication? Do not take this medication with any of the following: Deferoxamine Dimercaprol Other iron products This medication may also interact with the following: Chloramphenicol Deferasirox This list may not describe all possible interactions. Give your health care provider a list of all the medicines, herbs, non-prescription drugs, or dietary supplements you use. Also tell them if you  smoke, drink alcohol, or use illegal drugs. Some items may interact with your medicine. What should I watch for while using this medication? Visit your care team for regular checks on your progress. Tell your care team if your symptoms do not start to get better or if they get worse. You may need blood work done while you are taking this medication. You may need to eat more foods that contain iron. Talk to your care team. Foods that contain iron include whole grains or cereals, dried fruits, beans, peas, leafy green vegetables, and organ meats (liver, kidney). What side effects may I notice from receiving this medication? Side effects that you should report to your care team as soon as possible: Allergic reactions--skin rash, itching, hives, swelling of the face, lips, tongue, or throat Low blood pressure--dizziness, feeling faint or lightheaded, blurry vision Shortness of breath Side effects that usually do not require medical attention (report to your care team if they continue or are bothersome): Flushing Headache Joint pain Muscle pain Nausea Pain, redness, or irritation at injection site This list may not describe all possible side effects. Call your doctor for medical advice about side effects. You may report side effects to FDA at 1-800-FDA-1088. Where should I keep my medication? This medication is given in a hospital or clinic. It will not be stored at home. NOTE: This sheet is a summary. It may not cover all possible information. If you have questions about this medicine, talk to your doctor, pharmacist, or health care provider.  2024 Elsevier/Gold Standard (2023-06-29 00:00:00)

## 2024-06-15 ENCOUNTER — Inpatient Hospital Stay

## 2024-06-15 VITALS — BP 126/78 | HR 84 | Temp 98.3°F | Resp 18

## 2024-06-15 DIAGNOSIS — D500 Iron deficiency anemia secondary to blood loss (chronic): Secondary | ICD-10-CM | POA: Diagnosis not present

## 2024-06-15 MED ORDER — IRON SUCROSE 20 MG/ML IV SOLN
200.0000 mg | Freq: Once | INTRAVENOUS | Status: AC
Start: 2024-06-15 — End: 2024-06-15
  Administered 2024-06-15: 200 mg via INTRAVENOUS
  Filled 2024-06-15: qty 10

## 2024-06-15 MED ORDER — SODIUM CHLORIDE 0.9% FLUSH
10.0000 mL | Freq: Once | INTRAVENOUS | Status: AC | PRN
Start: 2024-06-15 — End: 2024-06-15
  Administered 2024-06-15: 10 mL

## 2024-06-15 MED ORDER — ACETAMINOPHEN 325 MG PO TABS: 650.0000 mg | ORAL_TABLET | Freq: Once | ORAL | Status: DC

## 2024-06-15 MED ORDER — LORATADINE 10 MG PO TABS: 10.0000 mg | ORAL_TABLET | Freq: Every day | ORAL | Status: DC

## 2024-06-15 NOTE — Patient Instructions (Signed)
 Iron Sucrose Injection What is this medication? IRON SUCROSE (EYE ern SOO krose) treats low levels of iron (iron deficiency anemia) in people with kidney disease. Iron is a mineral that plays an important role in making red blood cells, which carry oxygen from your lungs to the rest of your body. This medicine may be used for other purposes; ask your health care provider or pharmacist if you have questions. COMMON BRAND NAME(S): Venofer What should I tell my care team before I take this medication? They need to know if you have any of these conditions: Anemia not caused by low iron levels Heart disease High levels of iron in the blood Kidney disease Liver disease An unusual or allergic reaction to iron, other medications, foods, dyes, or preservatives Pregnant or trying to get pregnant Breastfeeding How should I use this medication? This medication is infused into a vein. It is given by your care team in a hospital or clinic setting. Talk to your care team about the use of this medication in children. While it may be prescribed for children as young as 2 years for selected conditions, precautions do apply. Overdosage: If you think you have taken too much of this medicine contact a poison control center or emergency room at once. NOTE: This medicine is only for you. Do not share this medicine with others. What if I miss a dose? Keep appointments for follow-up doses. It is important not to miss your dose. Call your care team if you are unable to keep an appointment. What may interact with this medication? Do not take this medication with any of the following: Deferoxamine Dimercaprol Other iron products This medication may also interact with the following: Chloramphenicol Deferasirox This list may not describe all possible interactions. Give your health care provider a list of all the medicines, herbs, non-prescription drugs, or dietary supplements you use. Also tell them if you smoke,  drink alcohol, or use illegal drugs. Some items may interact with your medicine. What should I watch for while using this medication? Visit your care team for regular checks on your progress. Tell your care team if your symptoms do not start to get better or if they get worse. You may need blood work done while you are taking this medication. You may need to eat more foods that contain iron. Talk to your care team. Foods that contain iron include whole grains or cereals, dried fruits, beans, peas, leafy green vegetables, and organ meats (liver, kidney). What side effects may I notice from receiving this medication? Side effects that you should report to your care team as soon as possible: Allergic reactions--skin rash, itching, hives, swelling of the face, lips, tongue, or throat Low blood pressure--dizziness, feeling faint or lightheaded, blurry vision Shortness of breath Side effects that usually do not require medical attention (report to your care team if they continue or are bothersome): Flushing Headache Joint pain Muscle pain Nausea Pain, redness, or irritation at injection site This list may not describe all possible side effects. Call your doctor for medical advice about side effects. You may report side effects to FDA at 1-800-FDA-1088. Where should I keep my medication? This medication is given in a hospital or clinic. It will not be stored at home. NOTE: This sheet is a summary. It may not cover all possible information. If you have questions about this medicine, talk to your doctor, pharmacist, or health care provider.  2024 Elsevier/Gold Standard (2023-06-29 00:00:00)

## 2024-06-22 ENCOUNTER — Other Ambulatory Visit

## 2024-06-22 ENCOUNTER — Ambulatory Visit: Admitting: Hematology and Oncology

## 2024-06-26 ENCOUNTER — Encounter (HOSPITAL_BASED_OUTPATIENT_CLINIC_OR_DEPARTMENT_OTHER): Attending: General Surgery | Admitting: General Surgery

## 2024-06-26 DIAGNOSIS — L97322 Non-pressure chronic ulcer of left ankle with fat layer exposed: Secondary | ICD-10-CM | POA: Diagnosis not present

## 2024-06-26 DIAGNOSIS — L97522 Non-pressure chronic ulcer of other part of left foot with fat layer exposed: Secondary | ICD-10-CM | POA: Insufficient documentation

## 2024-06-26 DIAGNOSIS — L89323 Pressure ulcer of left buttock, stage 3: Secondary | ICD-10-CM | POA: Insufficient documentation

## 2024-06-26 DIAGNOSIS — L97818 Non-pressure chronic ulcer of other part of right lower leg with other specified severity: Secondary | ICD-10-CM | POA: Insufficient documentation

## 2024-06-26 DIAGNOSIS — L97822 Non-pressure chronic ulcer of other part of left lower leg with fat layer exposed: Secondary | ICD-10-CM | POA: Diagnosis not present

## 2024-06-26 DIAGNOSIS — L97512 Non-pressure chronic ulcer of other part of right foot with fat layer exposed: Secondary | ICD-10-CM | POA: Diagnosis present

## 2024-06-26 DIAGNOSIS — G8221 Paraplegia, complete: Secondary | ICD-10-CM | POA: Diagnosis not present

## 2024-07-04 ENCOUNTER — Other Ambulatory Visit

## 2024-07-04 ENCOUNTER — Ambulatory Visit: Admitting: Hematology and Oncology

## 2024-07-16 ENCOUNTER — Encounter (HOSPITAL_BASED_OUTPATIENT_CLINIC_OR_DEPARTMENT_OTHER): Admitting: General Surgery

## 2024-07-16 DIAGNOSIS — L97522 Non-pressure chronic ulcer of other part of left foot with fat layer exposed: Secondary | ICD-10-CM | POA: Diagnosis not present

## 2024-08-07 ENCOUNTER — Other Ambulatory Visit (HOSPITAL_BASED_OUTPATIENT_CLINIC_OR_DEPARTMENT_OTHER): Payer: Self-pay | Admitting: General Surgery

## 2024-08-07 ENCOUNTER — Encounter (HOSPITAL_BASED_OUTPATIENT_CLINIC_OR_DEPARTMENT_OTHER): Attending: General Surgery | Admitting: General Surgery

## 2024-08-07 DIAGNOSIS — L97512 Non-pressure chronic ulcer of other part of right foot with fat layer exposed: Secondary | ICD-10-CM | POA: Insufficient documentation

## 2024-08-07 DIAGNOSIS — L97522 Non-pressure chronic ulcer of other part of left foot with fat layer exposed: Secondary | ICD-10-CM | POA: Insufficient documentation

## 2024-08-07 DIAGNOSIS — G8221 Paraplegia, complete: Secondary | ICD-10-CM | POA: Insufficient documentation

## 2024-08-07 DIAGNOSIS — L97818 Non-pressure chronic ulcer of other part of right lower leg with other specified severity: Secondary | ICD-10-CM | POA: Insufficient documentation

## 2024-08-07 DIAGNOSIS — L89323 Pressure ulcer of left buttock, stage 3: Secondary | ICD-10-CM | POA: Insufficient documentation

## 2024-08-07 DIAGNOSIS — L97822 Non-pressure chronic ulcer of other part of left lower leg with fat layer exposed: Secondary | ICD-10-CM | POA: Diagnosis not present

## 2024-08-07 DIAGNOSIS — L97322 Non-pressure chronic ulcer of left ankle with fat layer exposed: Secondary | ICD-10-CM | POA: Insufficient documentation

## 2024-08-09 LAB — DERMATOLOGY PATHOLOGY

## 2024-08-28 ENCOUNTER — Encounter (HOSPITAL_BASED_OUTPATIENT_CLINIC_OR_DEPARTMENT_OTHER): Attending: General Surgery | Admitting: General Surgery

## 2024-08-28 DIAGNOSIS — L97322 Non-pressure chronic ulcer of left ankle with fat layer exposed: Secondary | ICD-10-CM | POA: Insufficient documentation

## 2024-08-28 DIAGNOSIS — L97512 Non-pressure chronic ulcer of other part of right foot with fat layer exposed: Secondary | ICD-10-CM | POA: Diagnosis not present

## 2024-08-28 DIAGNOSIS — G8221 Paraplegia, complete: Secondary | ICD-10-CM | POA: Diagnosis not present

## 2024-08-28 DIAGNOSIS — L89323 Pressure ulcer of left buttock, stage 3: Secondary | ICD-10-CM | POA: Insufficient documentation

## 2024-08-28 DIAGNOSIS — L97522 Non-pressure chronic ulcer of other part of left foot with fat layer exposed: Secondary | ICD-10-CM | POA: Diagnosis present

## 2024-09-07 NOTE — Assessment & Plan Note (Signed)
 Iron  deficiency anemia most likely due to chronic GI blood loss, but possibly decreased absorption.  He was referred to GI for further recommendations.  Celiac panel revealed elevated IgA, but not tissue transglutaminase Ab, IgA, so no evidence of celiac disease.  SPEP did not reveal a monoclonal gammopathy.  His hemoglobin and MCV have improved with IV iron  but not normalized.  Iron  studies are pending from today.  I will plan to see him back in 3 months with a CBC, comprehensive metabolic panel, iron /TIBC and ferritin.
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
## 2024-09-11 ENCOUNTER — Telehealth: Payer: Self-pay

## 2024-09-11 ENCOUNTER — Other Ambulatory Visit: Payer: Self-pay

## 2024-09-11 ENCOUNTER — Inpatient Hospital Stay: Admitting: Hematology and Oncology

## 2024-09-11 ENCOUNTER — Encounter: Payer: Self-pay | Admitting: Hematology and Oncology

## 2024-09-11 ENCOUNTER — Inpatient Hospital Stay: Attending: Hematology and Oncology

## 2024-09-11 VITALS — BP 129/97 | HR 96 | Temp 97.9°F | Resp 18 | Ht 70.0 in | Wt 240.0 lb

## 2024-09-11 DIAGNOSIS — D500 Iron deficiency anemia secondary to blood loss (chronic): Secondary | ICD-10-CM | POA: Diagnosis present

## 2024-09-11 LAB — CBC WITH DIFFERENTIAL (CANCER CENTER ONLY)
Abs Immature Granulocytes: 0.03 K/uL (ref 0.00–0.07)
Basophils Absolute: 0.1 K/uL (ref 0.0–0.1)
Basophils Relative: 1 %
Eosinophils Absolute: 0.3 K/uL (ref 0.0–0.5)
Eosinophils Relative: 3 %
HCT: 37.1 % — ABNORMAL LOW (ref 39.0–52.0)
Hemoglobin: 12.2 g/dL — ABNORMAL LOW (ref 13.0–17.0)
Immature Granulocytes: 0 %
Lymphocytes Relative: 28 %
Lymphs Abs: 2.9 K/uL (ref 0.7–4.0)
MCH: 26.2 pg (ref 26.0–34.0)
MCHC: 32.9 g/dL (ref 30.0–36.0)
MCV: 79.8 fL — ABNORMAL LOW (ref 80.0–100.0)
Monocytes Absolute: 1.4 K/uL — ABNORMAL HIGH (ref 0.1–1.0)
Monocytes Relative: 13 %
Neutro Abs: 5.8 K/uL (ref 1.7–7.7)
Neutrophils Relative %: 55 %
Platelet Count: 464 K/uL — ABNORMAL HIGH (ref 150–400)
RBC: 4.65 MIL/uL (ref 4.22–5.81)
RDW: 16.5 % — ABNORMAL HIGH (ref 11.5–15.5)
WBC Count: 10.6 K/uL — ABNORMAL HIGH (ref 4.0–10.5)
nRBC: 0 % (ref 0.0–0.2)

## 2024-09-11 LAB — IRON AND TIBC
Iron: 30 ug/dL — ABNORMAL LOW (ref 45–182)
Saturation Ratios: 10 % — ABNORMAL LOW (ref 17.9–39.5)
TIBC: 294 ug/dL (ref 250–450)
UIBC: 265 ug/dL

## 2024-09-11 LAB — FERRITIN: Ferritin: 176 ng/mL (ref 24–336)

## 2024-09-11 NOTE — Telephone Encounter (Signed)
 Patient notified and voiced understanding.

## 2024-09-11 NOTE — Telephone Encounter (Signed)
-----   Message from Andrez DELENA Foy sent at 09/11/2024  2:56 PM EDT ----- Please let him know his iron  levels are better. I would still like him to talk to Dr. Larene. Have him continue oral iron  daily. Thank you

## 2024-09-18 ENCOUNTER — Encounter (HOSPITAL_BASED_OUTPATIENT_CLINIC_OR_DEPARTMENT_OTHER): Admitting: General Surgery

## 2024-09-18 DIAGNOSIS — L97522 Non-pressure chronic ulcer of other part of left foot with fat layer exposed: Secondary | ICD-10-CM | POA: Diagnosis not present

## 2024-10-08 ENCOUNTER — Encounter (HOSPITAL_BASED_OUTPATIENT_CLINIC_OR_DEPARTMENT_OTHER): Admitting: General Surgery

## 2024-10-22 ENCOUNTER — Encounter (HOSPITAL_BASED_OUTPATIENT_CLINIC_OR_DEPARTMENT_OTHER): Admitting: General Surgery

## 2024-10-22 DIAGNOSIS — G8221 Paraplegia, complete: Secondary | ICD-10-CM | POA: Diagnosis not present

## 2024-10-22 DIAGNOSIS — L97522 Non-pressure chronic ulcer of other part of left foot with fat layer exposed: Secondary | ICD-10-CM | POA: Insufficient documentation

## 2024-10-22 DIAGNOSIS — L97512 Non-pressure chronic ulcer of other part of right foot with fat layer exposed: Secondary | ICD-10-CM | POA: Diagnosis present

## 2024-10-22 DIAGNOSIS — L97823 Non-pressure chronic ulcer of other part of left lower leg with necrosis of muscle: Secondary | ICD-10-CM | POA: Insufficient documentation

## 2024-10-22 DIAGNOSIS — L97322 Non-pressure chronic ulcer of left ankle with fat layer exposed: Secondary | ICD-10-CM | POA: Diagnosis not present

## 2024-10-22 DIAGNOSIS — L89323 Pressure ulcer of left buttock, stage 3: Secondary | ICD-10-CM | POA: Diagnosis not present

## 2024-10-29 ENCOUNTER — Encounter: Payer: Self-pay | Admitting: Behavioral Health

## 2024-10-29 ENCOUNTER — Ambulatory Visit: Admitting: Behavioral Health

## 2024-10-29 DIAGNOSIS — F341 Dysthymic disorder: Secondary | ICD-10-CM | POA: Diagnosis not present

## 2024-10-29 MED ORDER — SERTRALINE HCL 100 MG PO TABS: ORAL_TABLET | ORAL | 1 refills | Status: AC

## 2024-10-29 NOTE — Progress Notes (Signed)
 [facility]  Patient ID: [name],  MRN: [id number]  PCP: [name] Pock, PA-C  Date of Evaluation: 10/29/2024 Time spent:20 minutes  Chief Complaint:  Chief Complaint   Depression; Anxiety; Follow-up; Medication Refill; Patient Education     HISTORY/CURRENT STATUS: HPI [name], 38 year old male presents to this office for follow up and medication management. No psychosocial changes this visit. Says everything is going well. He is requesting no medication changes  this visit. He reports 1/10 depression, and 0/10 anxiety. He sleeps 7 plus hours per night. Denies any significant social changes. He is wheelchair bound and has non healing wounds on both legs bilaterally. He goes to wound care once weekly. No changes in his social life. He denies mania, no psychosis. No SI/HI.     Individual Medical History/ Review of Systems: Changes? :No   Allergies: Sulfa antibiotics, Levofloxacin, Meropenem , Penicillins, and Zyvox [linezolid]  Current Medications:  Current Outpatient Medications:    Amino Acids-Protein Hydrolys (FEEDING SUPPLEMENT, PRO-STAT SUGAR FREE 64,) LIQD, Take 30 mLs by mouth 2 (two) times daily., Disp: , Rfl:    Ascorbic Acid (VITAMIN C) 1000 MG tablet, Take 1,000 mg by mouth daily., Disp: , Rfl:    baclofen  (LIORESAL ) 20 MG tablet, Take 40-60 mg by mouth 3 (three) times daily. 60mg  in the morning, 40mg  in the afternoon, and 60mg  in the evening, Disp: , Rfl:    Ferrous Sulfate (IRON  PO), Take 1 tablet by mouth daily., Disp: , Rfl:    fexofenadine (ALLEGRA ODT) 30 MG disintegrating tablet, Take 30 mg by mouth daily., Disp: , Rfl:    imipramine  (TOFRANIL ) 50 MG tablet, Take 50 mg by mouth at bedtime., Disp: , Rfl:    ketoconazole (NIZORAL) 2 % cream, Apply 1 Application topically daily as needed., Disp: , Rfl:    lisinopril  (ZESTRIL ) 10 MG tablet, Take 10 mg by mouth daily., Disp: , Rfl:    Melatonin 10 MG CAPS, Take by mouth. Nightly, Disp: , Rfl:    Multiple  Vitamin (MULTIVITAMIN) tablet, Take 1 tablet by mouth daily., Disp: , Rfl:    mupirocin  ointment (BACTROBAN ) 2 %, Apply topically daily., Disp: , Rfl:    oxybutynin  (DITROPAN  XL) 15 MG 24 hr tablet, Take 15 mg by mouth at bedtime. 2 tabs, Disp: , Rfl:    sertraline  (ZOLOFT ) 100 MG tablet, TAKE 1 AND 1/2 TABLETS(150 MG) BY MOUTH DAILY, Disp: 135 tablet, Rfl: 1   zinc sulfate 220 (50 Zn) MG capsule, Take 220 mg by mouth daily., Disp: , Rfl:  Medication Side Effects: none  Family Medical/ Social History: Changes? No  MENTAL HEALTH EXAM:  There were no vitals taken for this visit.There is no height or weight on file to calculate BMI.  General Appearance: Casual, Neat, and Well Groomed  Eye Contact:  Good  Speech:  Clear and Coherent  Volume:  Normal  Mood:  NA  Affect:  Appropriate  Thought Process:  Coherent  Orientation:  Full (Time, Place, and Person)  Thought Content: Logical   Suicidal Thoughts:  No  Homicidal Thoughts:  No  Memory:  WNL  Judgement:  Good  Insight:  Good  Psychomotor Activity:  Normal  Concentration:  Concentration: Good  Recall:  Good  Fund of Knowledge: Good  Language: Good  Assets:  Desire for Improvement  ADL's:  Intact  Cognition: WNL  Prognosis:  Good    DIAGNOSES:    ICD-10-CM   1. Persistent depressive disorder with melancholic features, currently mild  F34.1 sertraline  (ZOLOFT ) 100 MG tablet      Receiving Psychotherapy: No    RECOMMENDATIONS:  Greater than 50% of  30 min face to face time with patient was spent on counseling and coordination of care.  He is smiling and upbeat.  No social changes. He continues to enjoy great stability. Has strong family support. Will continue Zoloft  to 150 mg  daily.  Will report worsening symptoms promptly Will follow up in 6 months to reassess Provided emergency contact information Reviewed PDMP   Redell DELENA Pizza, NP

## 2024-11-12 ENCOUNTER — Encounter (HOSPITAL_BASED_OUTPATIENT_CLINIC_OR_DEPARTMENT_OTHER): Admitting: General Surgery

## 2024-11-12 ENCOUNTER — Encounter: Payer: Self-pay | Admitting: Hematology and Oncology

## 2024-11-12 DIAGNOSIS — L97522 Non-pressure chronic ulcer of other part of left foot with fat layer exposed: Secondary | ICD-10-CM | POA: Diagnosis not present

## 2024-11-17 ENCOUNTER — Ambulatory Visit (HOSPITAL_BASED_OUTPATIENT_CLINIC_OR_DEPARTMENT_OTHER)
Admission: EM | Admit: 2024-11-17 | Discharge: 2024-11-17 | Disposition: A | Discharge status: Home / Self Care | Attending: Family Medicine | Admitting: Family Medicine

## 2024-11-17 ENCOUNTER — Encounter (HOSPITAL_BASED_OUTPATIENT_CLINIC_OR_DEPARTMENT_OTHER): Payer: Self-pay

## 2024-11-17 DIAGNOSIS — R350 Frequency of micturition: Secondary | ICD-10-CM | POA: Insufficient documentation

## 2024-11-17 DIAGNOSIS — N3289 Other specified disorders of bladder: Secondary | ICD-10-CM | POA: Diagnosis present

## 2024-11-17 DIAGNOSIS — R8290 Unspecified abnormal findings in urine: Secondary | ICD-10-CM | POA: Insufficient documentation

## 2024-11-17 LAB — POCT URINE DIPSTICK
Bilirubin, UA: NEGATIVE
Glucose, UA: NEGATIVE mg/dL
Ketones, POC UA: NEGATIVE mg/dL
Leukocytes, UA: NEGATIVE
Nitrite, UA: NEGATIVE
Protein Ur, POC: 100 mg/dL — AB
Spec Grav, UA: 1.015
Urobilinogen, UA: 0.2 U/dL
pH, UA: 6

## 2024-11-17 MED ORDER — NITROFURANTOIN MONOHYD MACRO 100 MG PO CAPS: 100.0000 mg | ORAL_CAPSULE | Freq: Two times a day (BID) | ORAL | 0 refills | Status: AC
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
## 2024-11-17 NOTE — Discharge Instructions (Addendum)
 Bladder spasms with urinary frequency and malodorous urine: UA is slightly abnormal.  Based on his symptoms we will treat for UTI pending results of his culture.  Nitrofurantoin  100 mg twice daily for 7 days.  Will update the patient if the culture is negative and have him stop the nitrofurantoin .  Encouraged improved bowel care with bowel stimulation every other day to promote more regular bowel movements.  Continue good fluid intake.  Follow-up with urology as needed.  Return here as needed.

## 2024-11-17 NOTE — ED Triage Notes (Signed)
 Sx x 4 days  Cloudy foul smelling urine, leaking in between caths Bladder spasm

## 2024-11-18 ENCOUNTER — Ambulatory Visit (HOSPITAL_BASED_OUTPATIENT_CLINIC_OR_DEPARTMENT_OTHER): Payer: Self-pay | Admitting: Family Medicine

## 2024-11-18 NOTE — Progress Notes (Signed)
 Positive culture.  Patient was treated with nitrofurantoin  100 mg twice daily for 7 days.  Will adjust the plan of care, once the culture results, if needed.

## 2024-11-20 LAB — URINE CULTURE: Culture: 600 — AB

## 2024-11-20 NOTE — Progress Notes (Signed)
 Culture is positive and sensitive to the Nitrofurantoin .  Complete the antibiotic and follow-up as planned.  Patient updated via VM message and encouraged to follow-up with Primary Care, Urology or even return tot he UC, if his symptoms do not resolve, worsen or new symptoms occur.

## 2024-11-27 ENCOUNTER — Ambulatory Visit: Admitting: Behavioral Health

## 2024-12-05 ENCOUNTER — Encounter (HOSPITAL_BASED_OUTPATIENT_CLINIC_OR_DEPARTMENT_OTHER): Attending: General Surgery | Admitting: General Surgery

## 2024-12-05 ENCOUNTER — Encounter: Payer: Self-pay | Admitting: Hematology and Oncology

## 2024-12-05 DIAGNOSIS — L97522 Non-pressure chronic ulcer of other part of left foot with fat layer exposed: Secondary | ICD-10-CM | POA: Insufficient documentation

## 2024-12-05 DIAGNOSIS — L97823 Non-pressure chronic ulcer of other part of left lower leg with necrosis of muscle: Secondary | ICD-10-CM | POA: Insufficient documentation

## 2024-12-05 DIAGNOSIS — G8221 Paraplegia, complete: Secondary | ICD-10-CM | POA: Insufficient documentation

## 2024-12-05 DIAGNOSIS — L97512 Non-pressure chronic ulcer of other part of right foot with fat layer exposed: Secondary | ICD-10-CM | POA: Insufficient documentation

## 2024-12-05 DIAGNOSIS — L89323 Pressure ulcer of left buttock, stage 3: Secondary | ICD-10-CM | POA: Insufficient documentation

## 2024-12-12 ENCOUNTER — Inpatient Hospital Stay

## 2024-12-12 ENCOUNTER — Inpatient Hospital Stay: Admitting: Hematology and Oncology

## 2024-12-18 ENCOUNTER — Encounter (HOSPITAL_BASED_OUTPATIENT_CLINIC_OR_DEPARTMENT_OTHER): Admitting: General Surgery

## 2024-12-24 ENCOUNTER — Encounter (HOSPITAL_BASED_OUTPATIENT_CLINIC_OR_DEPARTMENT_OTHER): Admitting: General Surgery

## 2024-12-25 ENCOUNTER — Inpatient Hospital Stay: Admitting: Hematology and Oncology

## 2024-12-25 ENCOUNTER — Inpatient Hospital Stay

## 2024-12-26 ENCOUNTER — Encounter (HOSPITAL_BASED_OUTPATIENT_CLINIC_OR_DEPARTMENT_OTHER): Admitting: General Surgery

## 2024-12-31 ENCOUNTER — Encounter (HOSPITAL_BASED_OUTPATIENT_CLINIC_OR_DEPARTMENT_OTHER): Admitting: General Surgery

## 2025-01-01 ENCOUNTER — Inpatient Hospital Stay: Payer: Self-pay | Admitting: Hematology and Oncology

## 2025-01-01 ENCOUNTER — Inpatient Hospital Stay: Payer: Self-pay

## 2025-04-29 ENCOUNTER — Ambulatory Visit: Admitting: Behavioral Health
